# Patient Record
Sex: Male | Born: 1955
Health system: Southern US, Community
[De-identification: ages and names within clinical notes are randomized; demographics above are authoritative.]

## PROBLEM LIST (undated history)

## (undated) DIAGNOSIS — I872 Venous insufficiency (chronic) (peripheral): Secondary | ICD-10-CM

## (undated) DIAGNOSIS — I10 Essential (primary) hypertension: Secondary | ICD-10-CM

## (undated) DIAGNOSIS — M1990 Unspecified osteoarthritis, unspecified site: Secondary | ICD-10-CM

## (undated) DIAGNOSIS — I272 Pulmonary hypertension, unspecified: Secondary | ICD-10-CM

## (undated) DIAGNOSIS — I5021 Acute systolic (congestive) heart failure: Secondary | ICD-10-CM

## (undated) DIAGNOSIS — R0600 Dyspnea, unspecified: Secondary | ICD-10-CM

## (undated) DIAGNOSIS — C8590 Non-Hodgkin lymphoma, unspecified, unspecified site: Secondary | ICD-10-CM

## (undated) DIAGNOSIS — E119 Type 2 diabetes mellitus without complications: Secondary | ICD-10-CM

## (undated) DIAGNOSIS — I509 Heart failure, unspecified: Secondary | ICD-10-CM

## (undated) DIAGNOSIS — I4891 Unspecified atrial fibrillation: Secondary | ICD-10-CM

## (undated) DIAGNOSIS — E785 Hyperlipidemia, unspecified: Secondary | ICD-10-CM

## (undated) DIAGNOSIS — D126 Benign neoplasm of colon, unspecified: Secondary | ICD-10-CM

## (undated) DIAGNOSIS — C801 Malignant (primary) neoplasm, unspecified: Secondary | ICD-10-CM

## (undated) DIAGNOSIS — I83009 Varicose veins of unspecified lower extremity with ulcer of unspecified site: Secondary | ICD-10-CM

## (undated) DIAGNOSIS — M109 Gout, unspecified: Secondary | ICD-10-CM

## (undated) DIAGNOSIS — K648 Other hemorrhoids: Secondary | ICD-10-CM

## (undated) DIAGNOSIS — L97909 Non-pressure chronic ulcer of unspecified part of unspecified lower leg with unspecified severity: Secondary | ICD-10-CM

## (undated) DIAGNOSIS — K5790 Diverticulosis of intestine, part unspecified, without perforation or abscess without bleeding: Secondary | ICD-10-CM

## (undated) HISTORY — PX: ESOPHAGOGASTRODUODENOSCOPY ENDOSCOPY: SHX5814

## (undated) HISTORY — DX: Hyperlipidemia, unspecified: E78.5

## (undated) HISTORY — DX: Diverticulosis of intestine, part unspecified, without perforation or abscess without bleeding: K57.90

## (undated) HISTORY — DX: Non-Hodgkin lymphoma, unspecified, unspecified site: C85.90

## (undated) HISTORY — DX: Venous insufficiency (chronic) (peripheral): I87.2

## (undated) HISTORY — DX: Unspecified atrial fibrillation: I48.91

## (undated) HISTORY — DX: Gout, unspecified: M10.9

## (undated) HISTORY — PX: INGUINAL HERNIA REPAIR: SUR1180

## (undated) HISTORY — DX: Benign neoplasm of colon, unspecified: D12.6

## (undated) HISTORY — PX: BRONCHOSCOPY: SUR163

## (undated) HISTORY — DX: Essential (primary) hypertension: I10

## (undated) HISTORY — DX: Other hemorrhoids: K64.8

## (undated) HISTORY — DX: Pulmonary hypertension, unspecified: I27.20

## (undated) HISTORY — DX: Acute systolic (congestive) heart failure: I50.21

---
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
## 2001-10-01 ENCOUNTER — Emergency Department (HOSPITAL_COMMUNITY): Admission: EM | Admit: 2001-10-01 | Discharge: 2001-10-02 | Payer: Self-pay | Admitting: *Deleted

## 2001-10-02 ENCOUNTER — Encounter: Payer: Self-pay | Admitting: General Surgery

## 2001-10-02 ENCOUNTER — Observation Stay (HOSPITAL_COMMUNITY): Admission: RE | Admit: 2001-10-02 | Discharge: 2001-10-03 | Payer: Self-pay | Admitting: General Surgery

## 2004-08-30 ENCOUNTER — Inpatient Hospital Stay (HOSPITAL_COMMUNITY): Admission: EM | Admit: 2004-08-30 | Discharge: 2004-09-02 | Payer: Self-pay | Admitting: Emergency Medicine

## 2007-02-22 ENCOUNTER — Encounter (HOSPITAL_BASED_OUTPATIENT_CLINIC_OR_DEPARTMENT_OTHER): Admission: RE | Admit: 2007-02-22 | Discharge: 2007-05-23 | Payer: Self-pay | Admitting: Surgery

## 2007-05-23 ENCOUNTER — Encounter (HOSPITAL_BASED_OUTPATIENT_CLINIC_OR_DEPARTMENT_OTHER): Admission: RE | Admit: 2007-05-23 | Discharge: 2007-08-21 | Payer: Self-pay | Admitting: Surgery

## 2007-06-13 ENCOUNTER — Encounter: Admission: RE | Admit: 2007-06-13 | Discharge: 2007-06-13 | Payer: Self-pay | Admitting: Internal Medicine

## 2007-08-27 ENCOUNTER — Encounter (HOSPITAL_BASED_OUTPATIENT_CLINIC_OR_DEPARTMENT_OTHER): Admission: RE | Admit: 2007-08-27 | Discharge: 2007-10-05 | Payer: Self-pay | Admitting: Internal Medicine

## 2007-10-05 ENCOUNTER — Encounter (HOSPITAL_BASED_OUTPATIENT_CLINIC_OR_DEPARTMENT_OTHER): Admission: RE | Admit: 2007-10-05 | Discharge: 2008-01-03 | Payer: Self-pay | Admitting: Internal Medicine

## 2008-01-07 ENCOUNTER — Encounter (HOSPITAL_BASED_OUTPATIENT_CLINIC_OR_DEPARTMENT_OTHER): Admission: RE | Admit: 2008-01-07 | Discharge: 2008-01-07 | Payer: Self-pay | Admitting: Internal Medicine

## 2008-01-08 ENCOUNTER — Encounter (HOSPITAL_BASED_OUTPATIENT_CLINIC_OR_DEPARTMENT_OTHER): Admission: RE | Admit: 2008-01-08 | Discharge: 2008-04-07 | Payer: Self-pay | Admitting: Internal Medicine

## 2008-04-08 ENCOUNTER — Encounter (HOSPITAL_BASED_OUTPATIENT_CLINIC_OR_DEPARTMENT_OTHER): Admission: RE | Admit: 2008-04-08 | Discharge: 2008-07-07 | Payer: Self-pay | Admitting: General Surgery

## 2009-01-10 HISTORY — PX: PORTA CATH INSERTION: CATH118285

## 2009-06-16 DIAGNOSIS — R809 Proteinuria, unspecified: Secondary | ICD-10-CM | POA: Insufficient documentation

## 2009-06-16 DIAGNOSIS — E785 Hyperlipidemia, unspecified: Secondary | ICD-10-CM | POA: Insufficient documentation

## 2009-06-16 DIAGNOSIS — I868 Varicose veins of other specified sites: Secondary | ICD-10-CM | POA: Insufficient documentation

## 2009-06-16 DIAGNOSIS — R7301 Impaired fasting glucose: Secondary | ICD-10-CM | POA: Insufficient documentation

## 2009-06-16 DIAGNOSIS — Z0000 Encounter for general adult medical examination without abnormal findings: Secondary | ICD-10-CM | POA: Insufficient documentation

## 2009-11-16 DIAGNOSIS — I872 Venous insufficiency (chronic) (peripheral): Secondary | ICD-10-CM | POA: Insufficient documentation

## 2009-11-30 ENCOUNTER — Encounter: Admission: RE | Admit: 2009-11-30 | Discharge: 2009-11-30 | Payer: Self-pay | Admitting: Internal Medicine

## 2009-12-23 ENCOUNTER — Ambulatory Visit: Payer: Self-pay | Admitting: Thoracic Surgery

## 2010-01-12 ENCOUNTER — Ambulatory Visit (HOSPITAL_COMMUNITY)
Admission: RE | Admit: 2010-01-12 | Discharge: 2010-01-12 | Payer: Self-pay | Source: Home / Self Care | Attending: Thoracic Surgery | Admitting: Thoracic Surgery

## 2010-01-19 ENCOUNTER — Ambulatory Visit
Admission: RE | Admit: 2010-01-19 | Discharge: 2010-01-19 | Payer: Self-pay | Source: Home / Self Care | Attending: Thoracic Surgery | Admitting: Thoracic Surgery

## 2010-02-01 ENCOUNTER — Ambulatory Visit (HOSPITAL_COMMUNITY)
Admission: RE | Admit: 2010-02-01 | Discharge: 2010-02-01 | Payer: Self-pay | Source: Home / Self Care | Attending: Thoracic Surgery | Admitting: Thoracic Surgery

## 2010-02-02 ENCOUNTER — Ambulatory Visit (HOSPITAL_COMMUNITY)
Admission: RE | Admit: 2010-02-02 | Discharge: 2010-02-02 | Payer: Self-pay | Source: Home / Self Care | Attending: Thoracic Surgery | Admitting: Thoracic Surgery

## 2010-02-02 ENCOUNTER — Ambulatory Visit (HOSPITAL_BASED_OUTPATIENT_CLINIC_OR_DEPARTMENT_OTHER): Payer: 59 | Admitting: Internal Medicine

## 2010-02-02 LAB — CBC
HCT: 41.1 % (ref 39.0–52.0)
Hemoglobin: 14.2 g/dL (ref 13.0–17.0)
MCH: 32.9 pg (ref 26.0–34.0)
MCHC: 34.5 g/dL (ref 30.0–36.0)
MCV: 95.1 fL (ref 78.0–100.0)
Platelets: 218 10*3/uL (ref 150–400)
RBC: 4.32 MIL/uL (ref 4.22–5.81)
RDW: 13.1 % (ref 11.5–15.5)
WBC: 7.1 10*3/uL (ref 4.0–10.5)

## 2010-02-02 LAB — PROTIME-INR
INR: 0.9 (ref 0.00–1.49)
Prothrombin Time: 12.4 seconds (ref 11.6–15.2)

## 2010-02-02 LAB — COMPREHENSIVE METABOLIC PANEL
ALT: 24 U/L (ref 0–53)
AST: 25 U/L (ref 0–37)
Albumin: 3.7 g/dL (ref 3.5–5.2)
Alkaline Phosphatase: 135 U/L — ABNORMAL HIGH (ref 39–117)
BUN: 21 mg/dL (ref 6–23)
CO2: 26 mEq/L (ref 19–32)
Calcium: 9.4 mg/dL (ref 8.4–10.5)
Chloride: 102 mEq/L (ref 96–112)
Creatinine, Ser: 0.88 mg/dL (ref 0.4–1.5)
GFR calc Af Amer: >60 mL/min (ref >60)
GFR calc non Af Amer: >60 mL/min (ref >60)
Glucose, Bld: 118 mg/dL — ABNORMAL HIGH (ref 70–99)
Potassium: 4 mEq/L (ref 3.5–5.1)
Sodium: 137 mEq/L (ref 135–145)
Total Bilirubin: 0.4 mg/dL (ref 0.3–1.2)
Total Protein: 6.6 g/dL (ref 6.0–8.3)

## 2010-02-02 LAB — SURGICAL PCR SCREEN
MRSA, PCR: NEGATIVE
Staphylococcus aureus: NEGATIVE

## 2010-02-02 LAB — TYPE AND SCREEN
ABO/RH(D): O POS
Antibody Screen: NEGATIVE

## 2010-02-02 LAB — APTT: aPTT: 27 seconds (ref 24–37)

## 2010-02-02 LAB — GLUCOSE, CAPILLARY: Glucose-Capillary: 124 mg/dL — ABNORMAL HIGH (ref 70–99)

## 2010-02-04 ENCOUNTER — Ambulatory Visit
Admission: RE | Admit: 2010-02-04 | Discharge: 2010-02-04 | Payer: Self-pay | Source: Home / Self Care | Admitting: Thoracic Surgery

## 2010-02-04 ENCOUNTER — Ambulatory Visit
Admission: RE | Admit: 2010-02-04 | Discharge: 2010-02-04 | Payer: Self-pay | Source: Home / Self Care | Attending: Internal Medicine | Admitting: Internal Medicine

## 2010-02-05 NOTE — Letter (Signed)
February 04, 2010  [name], MD [address] [place], [place] [address]  Re:  [name], [name]                  DOB:  05/31/1955  Dear Dr. Clelia Croft,  I saw the patient back in the office today after his mediastinoscopy and unfortunately he has a non-Hodgkins lymphoma, B-cell type, which is a follicular variant and referred him to Dr. Shirline Frees to undergo chemotherapy.  His PET scan shows this is probably a stage III-IV.  His blood pressure is 132/77, pulse 72, respirations 24, sats were 98%, and his weight is 350 pounds.  I will arrange for him to have Port-A-Cath in the future.  We appreciate the opportunity of seeing the patient.  Sincerely,  Ines Bloomer, M.[name]. Electronically Signed  DPB/MEDQ  [name]:  02/04/2010  T:  02/04/2010  Job:  [id number]

## 2010-02-07 NOTE — Op Note (Signed)
  [name], SALZWEDEL                  ACCOUNT NO.:  [id number]  MEDICAL RECORD NO.:  [id number]          PATIENT TYPE:  OUT  LOCATION:  XRAY                         FACILITY:  [facility]  PHYSICIAN:  Ines Bloomer, M.D. DATE OF BIRTH:  Sep 20, 1955  DATE OF PROCEDURE: DATE OF DISCHARGE:  02/01/2010                              OPERATIVE REPORT   PREOPERATIVE DIAGNOSIS:  Mediastinal adenopathy.  POSTOPERATIVE DIAGNOSIS:  Mediastinal adenopathy.  OPERATION PERFORMED:  Mediastinoscopy.  SURGEON:  Ines Bloomer, MD  ANESTHESIA:  General anesthesia.  After percutaneous insertion of all monitoring lines, the patient underwent general anesthesia.  He did have previous bronchoscopy with endobronchial ultrasounds and the conclusions have been a lymphoproliferative disorder with more tissue was needed, so we decided to do mediastinoscopy.  The anterior neck was prepped and draped in usual sterile manner.  A transverse incision was made, was carried down with electrocautery through subcutaneous tissue and fascia.  The pretracheal fascia was entered.  The video mediastinoscope was inserted and there was a large 4R node.  We did multiple biopsies of this including the frozen section which was read as probable possible follicular lymphoma.  More tissue was sent for lymphoma workup.  Strap muscles were closed with 2-0 Vicryl, subcutaneous tissue with 3-0 Vicryl, and Dermabond for the skin.  The patient returned to recovery room in stable condition.     Ines Bloomer, M.D.     DPB/MEDQ  D:  02/02/2010  T:  02/03/2010  Job:  [id number]  Electronically Signed by Jovita Gamma M.D. on 02/07/2010 05:16:32 PM

## 2010-02-10 ENCOUNTER — Encounter: Payer: Self-pay | Admitting: Cardiovascular Disease

## 2010-02-10 ENCOUNTER — Other Ambulatory Visit (HOSPITAL_COMMUNITY): Payer: Self-pay

## 2010-02-10 ENCOUNTER — Ambulatory Visit (HOSPITAL_COMMUNITY): Payer: 59 | Attending: Internal Medicine

## 2010-02-10 ENCOUNTER — Other Ambulatory Visit: Payer: Self-pay | Admitting: Internal Medicine

## 2010-02-10 DIAGNOSIS — Z5111 Encounter for antineoplastic chemotherapy: Secondary | ICD-10-CM

## 2010-02-10 DIAGNOSIS — C8589 Other specified types of non-Hodgkin lymphoma, extranodal and solid organ sites: Secondary | ICD-10-CM | POA: Insufficient documentation

## 2010-02-10 DIAGNOSIS — I428 Other cardiomyopathies: Secondary | ICD-10-CM | POA: Insufficient documentation

## 2010-02-10 DIAGNOSIS — I739 Peripheral vascular disease, unspecified: Secondary | ICD-10-CM | POA: Insufficient documentation

## 2010-02-10 DIAGNOSIS — I10 Essential (primary) hypertension: Secondary | ICD-10-CM | POA: Insufficient documentation

## 2010-02-10 DIAGNOSIS — C8590 Non-Hodgkin lymphoma, unspecified, unspecified site: Secondary | ICD-10-CM

## 2010-02-10 DIAGNOSIS — Z6841 Body Mass Index (BMI) 40.0 and over, adult: Secondary | ICD-10-CM | POA: Insufficient documentation

## 2010-02-10 DIAGNOSIS — Z87891 Personal history of nicotine dependence: Secondary | ICD-10-CM | POA: Insufficient documentation

## 2010-02-15 ENCOUNTER — Other Ambulatory Visit (HOSPITAL_COMMUNITY): Payer: 59

## 2010-02-15 ENCOUNTER — Encounter: Payer: 59 | Admitting: Internal Medicine

## 2010-02-15 ENCOUNTER — Ambulatory Visit: Payer: 59 | Admitting: Internal Medicine

## 2010-02-15 DIAGNOSIS — C8588 Other specified types of non-Hodgkin lymphoma, lymph nodes of multiple sites: Secondary | ICD-10-CM

## 2010-02-15 LAB — CBC WITH DIFFERENTIAL/PLATELET
Basophils Absolute: 0 10*3/uL (ref 0.0–0.1)
Eosinophils Absolute: 0.4 10*3/uL (ref 0.0–0.5)
HCT: 41.3 % (ref 38.4–49.9)
HGB: 14.3 g/dL (ref 13.0–17.1)
MCV: 94.5 fL (ref 79.3–98.0)
NEUT#: 5.1 10*3/uL (ref 1.5–6.5)
RDW: 13.4 % (ref 11.0–14.6)
lymph#: 1.4 10*3/uL (ref 0.9–3.3)

## 2010-02-15 LAB — COMPREHENSIVE METABOLIC PANEL
ALT: 27 U/L (ref 0–53)
AST: 25 U/L (ref 0–37)
Albumin: 4 g/dL (ref 3.5–5.2)
Alkaline Phosphatase: 135 U/L — ABNORMAL HIGH (ref 39–117)
BUN: 20 mg/dL (ref 6–23)
CO2: 21 mEq/L (ref 19–32)
Calcium: 9.8 mg/dL (ref 8.4–10.5)
Chloride: 102 mEq/L (ref 96–112)
Creatinine, Ser: 0.85 mg/dL (ref 0.40–1.50)
Glucose, Bld: 119 mg/dL — ABNORMAL HIGH (ref 70–99)
Potassium: 4.3 mEq/L (ref 3.5–5.3)
Sodium: 138 mEq/L (ref 135–145)
Total Bilirubin: 0.3 mg/dL (ref 0.3–1.2)
Total Protein: 6.8 g/dL (ref 6.0–8.3)

## 2010-02-15 LAB — URIC ACID: Uric Acid, Serum: 10.8 mg/dL — ABNORMAL HIGH (ref 4.0–7.8)

## 2010-02-15 LAB — LACTATE DEHYDROGENASE: LDH: 156 U/L (ref 94–250)

## 2010-02-18 ENCOUNTER — Ambulatory Visit (HOSPITAL_COMMUNITY)
Admission: RE | Admit: 2010-02-18 | Discharge: 2010-02-18 | Disposition: A | Discharge status: Home / Self Care | Payer: 59 | Source: Ambulatory Visit | Attending: Thoracic Surgery | Admitting: Thoracic Surgery

## 2010-02-18 ENCOUNTER — Other Ambulatory Visit: Payer: Self-pay | Admitting: Internal Medicine

## 2010-02-18 ENCOUNTER — Other Ambulatory Visit: Payer: Self-pay | Admitting: Diagnostic Radiology

## 2010-02-18 ENCOUNTER — Ambulatory Visit (HOSPITAL_COMMUNITY): Payer: 59

## 2010-02-18 ENCOUNTER — Other Ambulatory Visit (HOSPITAL_COMMUNITY): Payer: 59

## 2010-02-18 ENCOUNTER — Ambulatory Visit (HOSPITAL_COMMUNITY): Payer: 59 | Attending: Internal Medicine

## 2010-02-18 DIAGNOSIS — C8589 Other specified types of non-Hodgkin lymphoma, extranodal and solid organ sites: Secondary | ICD-10-CM | POA: Insufficient documentation

## 2010-02-18 DIAGNOSIS — Z01812 Encounter for preprocedural laboratory examination: Secondary | ICD-10-CM | POA: Insufficient documentation

## 2010-02-18 DIAGNOSIS — C8590 Non-Hodgkin lymphoma, unspecified, unspecified site: Secondary | ICD-10-CM

## 2010-02-18 DIAGNOSIS — Z01818 Encounter for other preprocedural examination: Secondary | ICD-10-CM | POA: Insufficient documentation

## 2010-02-18 DIAGNOSIS — C8582 Other specified types of non-Hodgkin lymphoma, intrathoracic lymph nodes: Secondary | ICD-10-CM

## 2010-02-18 DIAGNOSIS — I872 Venous insufficiency (chronic) (peripheral): Secondary | ICD-10-CM | POA: Insufficient documentation

## 2010-02-18 DIAGNOSIS — E785 Hyperlipidemia, unspecified: Secondary | ICD-10-CM | POA: Insufficient documentation

## 2010-02-18 DIAGNOSIS — I10 Essential (primary) hypertension: Secondary | ICD-10-CM | POA: Insufficient documentation

## 2010-02-18 DIAGNOSIS — C8588 Other specified types of non-Hodgkin lymphoma, lymph nodes of multiple sites: Secondary | ICD-10-CM | POA: Insufficient documentation

## 2010-02-18 LAB — CBC
Hemoglobin: 13.4 g/dL (ref 13.0–17.0)
MCH: 32.8 pg (ref 26.0–34.0)
MCV: 94.1 fL (ref 78.0–100.0)
RBC: 4.08 MIL/uL — ABNORMAL LOW (ref 4.22–5.81)

## 2010-02-18 LAB — COMPREHENSIVE METABOLIC PANEL
ALT: 30 U/L (ref 0–53)
AST: 26 U/L (ref 0–37)
CO2: 24 mEq/L (ref 19–32)
Chloride: 103 mEq/L (ref 96–112)
GFR calc Af Amer: >60 mL/min (ref >60)
GFR calc non Af Amer: >60 mL/min (ref >60)
Potassium: 4.4 mEq/L (ref 3.5–5.1)
Sodium: 137 mEq/L (ref 135–145)
Total Bilirubin: 0.4 mg/dL (ref 0.3–1.2)

## 2010-02-18 LAB — SURGICAL PCR SCREEN
MRSA, PCR: NEGATIVE
Staphylococcus aureus: NEGATIVE

## 2010-02-19 ENCOUNTER — Ambulatory Visit: Payer: 59 | Admitting: Thoracic Surgery

## 2010-02-19 ENCOUNTER — Other Ambulatory Visit (HOSPITAL_COMMUNITY): Payer: 59

## 2010-02-22 ENCOUNTER — Encounter (HOSPITAL_BASED_OUTPATIENT_CLINIC_OR_DEPARTMENT_OTHER): Payer: 59 | Admitting: Internal Medicine

## 2010-02-22 DIAGNOSIS — Z5111 Encounter for antineoplastic chemotherapy: Secondary | ICD-10-CM

## 2010-02-22 DIAGNOSIS — Z5112 Encounter for antineoplastic immunotherapy: Secondary | ICD-10-CM

## 2010-02-22 DIAGNOSIS — C8299 Follicular lymphoma, unspecified, extranodal and solid organ sites: Secondary | ICD-10-CM

## 2010-02-23 ENCOUNTER — Encounter (HOSPITAL_BASED_OUTPATIENT_CLINIC_OR_DEPARTMENT_OTHER): Payer: 59 | Admitting: Internal Medicine

## 2010-02-23 DIAGNOSIS — Z5189 Encounter for other specified aftercare: Secondary | ICD-10-CM

## 2010-02-23 DIAGNOSIS — C8588 Other specified types of non-Hodgkin lymphoma, lymph nodes of multiple sites: Secondary | ICD-10-CM

## 2010-02-24 ENCOUNTER — Ambulatory Visit: Payer: 59 | Admitting: Thoracic Surgery

## 2010-02-25 NOTE — Op Note (Signed)
  [name], [name]                  ACCOUNT NO.:  [id number]  MEDICAL RECORD NO.:  [id number]           PATIENT TYPE:  O  LOCATION:  MDC                          FACILITY:  [facility]  PHYSICIAN:  Ines Bloomer, M.D. DATE OF BIRTH:  [birth year]  DATE OF PROCEDURE: DATE OF DISCHARGE:                              OPERATIVE REPORT   PREOPERATIVE DIAGNOSIS:  B-cell lymphoma.  POSTOPERATIVE DIAGNOSIS:  B-cell lymphoma.  OPERATION:  Insertion of left subclavian Port-A-Cath.  SURGEON:  Ines Bloomer, MD  General anesthesia with Cetacaine and Xylocaine with IV sedation.  After prepping and draping the left chest, area was infiltrated with 1% Xylocaine and a left subclavian puncture was performed and a guidewire threaded under fluoro guidance to the right atrium.  Stab wound was made around the guidewire.  Over the guidewire was passed a dilator with a sheath.  We guidewire and the dilator were removed, and through the peel- away sheath was passed the tubing for the Port-A-Cath to the right atrial SVC junction.  Then, another area was infiltrated with 1% Xylocaine.  Inferior to this, a transverse incision was made and dissection was carried down through the subcutaneous tissue to the pectoral fascia.  The pocket was dissected out.  The tubing was then tunneled down to the pocket and cut appropriately and then connected with the connector to the 8.6 Bard Port-A-Cath.  The Port-A-Cath was placed in the pocket and sutured in place with 0 silk.  Wounds were closed with 3-0 Vicryl and Dermabond for the skin, withdrew easily and flushed easily.     Ines Bloomer, M.D.     DPB/MEDQ  D:  02/18/2010  T:  02/19/2010  Job:  [id number]  Electronically Signed by Jovita Gamma M.D. on 02/25/2010 03:39:22 PM

## 2010-02-26 ENCOUNTER — Ambulatory Visit: Payer: 59 | Admitting: Thoracic Surgery

## 2010-03-02 ENCOUNTER — Ambulatory Visit (INDEPENDENT_AMBULATORY_CARE_PROVIDER_SITE_OTHER): Payer: Self-pay | Admitting: Thoracic Surgery

## 2010-03-02 DIAGNOSIS — C8582 Other specified types of non-Hodgkin lymphoma, intrathoracic lymph nodes: Secondary | ICD-10-CM

## 2010-03-03 NOTE — Letter (Deleted)
March 02, 2010    Re:  [name], [name]                  DOB:  06-11-1955    The patient returned today.  His mediastinoscopy site is healing well as well as his Port-A-Cath site.  His blood pressure is 143/75, pulse 66, respirations 18, sats were 95%.  He asked about his bone marrow and we said that was negative also.  We will see him again after he completes his treatment.  He has already had one chemotherapy treatment.  Ines Bloomer, M.[name]. Electronically Signed  DPB/MEDQ  [name]:  03/02/2010  T:  03/02/2010  Job:  [id number]

## 2010-03-15 ENCOUNTER — Encounter (HOSPITAL_BASED_OUTPATIENT_CLINIC_OR_DEPARTMENT_OTHER): Payer: 59 | Admitting: Internal Medicine

## 2010-03-15 ENCOUNTER — Other Ambulatory Visit: Payer: Self-pay | Admitting: Internal Medicine

## 2010-03-15 DIAGNOSIS — Z5112 Encounter for antineoplastic immunotherapy: Secondary | ICD-10-CM

## 2010-03-15 DIAGNOSIS — Z5111 Encounter for antineoplastic chemotherapy: Secondary | ICD-10-CM

## 2010-03-15 DIAGNOSIS — C8299 Follicular lymphoma, unspecified, extranodal and solid organ sites: Secondary | ICD-10-CM

## 2010-03-15 DIAGNOSIS — C8588 Other specified types of non-Hodgkin lymphoma, lymph nodes of multiple sites: Secondary | ICD-10-CM

## 2010-03-15 LAB — LACTATE DEHYDROGENASE: LDH: 160 U/L (ref 94–250)

## 2010-03-15 LAB — CBC WITH DIFFERENTIAL/PLATELET
BASO%: 0.1 % (ref 0.0–2.0)
EOS%: 0.5 % (ref 0.0–7.0)
MCH: 32.1 pg (ref 27.2–33.4)
MCHC: 34.4 g/dL (ref 32.0–36.0)
MONO#: 1 10*3/uL — ABNORMAL HIGH (ref 0.1–0.9)
RDW: 13.6 % (ref 11.0–14.6)
WBC: 9 10*3/uL (ref 4.0–10.3)
lymph#: 1.5 10*3/uL (ref 0.9–3.3)

## 2010-03-15 LAB — COMPREHENSIVE METABOLIC PANEL
ALT: 21 U/L (ref 0–53)
AST: 17 U/L (ref 0–37)
Albumin: 4 g/dL (ref 3.5–5.2)
CO2: 26 mEq/L (ref 19–32)
Calcium: 9.2 mg/dL (ref 8.4–10.5)
Chloride: 102 mEq/L (ref 96–112)
Potassium: 3.7 mEq/L (ref 3.5–5.3)
Sodium: 139 mEq/L (ref 135–145)
Total Protein: 6.2 g/dL (ref 6.0–8.3)

## 2010-03-16 ENCOUNTER — Encounter (HOSPITAL_BASED_OUTPATIENT_CLINIC_OR_DEPARTMENT_OTHER): Payer: 59 | Admitting: Internal Medicine

## 2010-03-16 DIAGNOSIS — C8299 Follicular lymphoma, unspecified, extranodal and solid organ sites: Secondary | ICD-10-CM

## 2010-03-16 DIAGNOSIS — Z5189 Encounter for other specified aftercare: Secondary | ICD-10-CM

## 2010-03-22 ENCOUNTER — Other Ambulatory Visit: Payer: Self-pay | Admitting: Internal Medicine

## 2010-03-22 ENCOUNTER — Encounter (HOSPITAL_BASED_OUTPATIENT_CLINIC_OR_DEPARTMENT_OTHER): Payer: 59 | Admitting: Internal Medicine

## 2010-03-22 DIAGNOSIS — C8588 Other specified types of non-Hodgkin lymphoma, lymph nodes of multiple sites: Secondary | ICD-10-CM

## 2010-03-22 DIAGNOSIS — Z5111 Encounter for antineoplastic chemotherapy: Secondary | ICD-10-CM

## 2010-03-22 DIAGNOSIS — Z5112 Encounter for antineoplastic immunotherapy: Secondary | ICD-10-CM

## 2010-03-22 DIAGNOSIS — C8299 Follicular lymphoma, unspecified, extranodal and solid organ sites: Secondary | ICD-10-CM

## 2010-03-22 LAB — CBC WITH DIFFERENTIAL/PLATELET
EOS%: 0 % (ref 0.0–7.0)
Eosinophils Absolute: 0 10*3/uL (ref 0.0–0.5)
LYMPH%: 4.5 % — ABNORMAL LOW (ref 14.0–49.0)
MCH: 31.7 pg (ref 27.2–33.4)
MCHC: 33.8 g/dL (ref 32.0–36.0)
MCV: 93.7 fL (ref 79.3–98.0)
MONO%: 2.7 % (ref 0.0–14.0)
NEUT#: 11.5 10*3/uL — ABNORMAL HIGH (ref 1.5–6.5)
Platelets: 237 10*3/uL (ref 140–400)
RBC: 3.6 10*6/uL — ABNORMAL LOW (ref 4.20–5.82)

## 2010-03-22 LAB — COMPREHENSIVE METABOLIC PANEL
Albumin: 4 g/dL (ref 3.5–5.2)
BUN: 16 mg/dL (ref 6–23)
BUN: 35 mg/dL — ABNORMAL HIGH (ref 6–23)
CO2: 23 mEq/L (ref 19–32)
Creatinine, Ser: 0.87 mg/dL (ref 0.4–1.5)
Creatinine, Ser: 0.96 mg/dL (ref 0.40–1.50)
GFR calc Af Amer: >60 mL/min (ref >60)
Glucose, Bld: 143 mg/dL — ABNORMAL HIGH (ref 70–99)
Potassium: 4.6 mEq/L (ref 3.5–5.1)
Total Bilirubin: 0.3 mg/dL (ref 0.3–1.2)
Total Protein: 6.8 g/dL (ref 6.0–8.3)

## 2010-03-22 LAB — CULTURE, RESPIRATORY W GRAM STAIN: Culture: NORMAL

## 2010-03-22 LAB — TYPE AND SCREEN: ABO/RH(D): O POS

## 2010-03-22 LAB — CBC
MCV: 96.9 fL (ref 78.0–100.0)
Platelets: 201 10*3/uL (ref 150–400)
RDW: 13.8 % (ref 11.5–15.5)
WBC: 7.8 10*3/uL (ref 4.0–10.5)

## 2010-03-22 LAB — PROTIME-INR: INR: 0.89 (ref 0.00–1.49)

## 2010-03-22 LAB — APTT: aPTT: 27 seconds (ref 24–37)

## 2010-03-22 LAB — AFB CULTURE WITH SMEAR (NOT AT ARMC): Acid Fast Smear: NONE SEEN

## 2010-03-22 LAB — FUNGUS CULTURE W SMEAR: Fungal Smear: NONE SEEN

## 2010-03-22 LAB — SURGICAL PCR SCREEN: MRSA, PCR: NEGATIVE

## 2010-03-29 ENCOUNTER — Other Ambulatory Visit: Payer: Self-pay | Admitting: Internal Medicine

## 2010-03-29 ENCOUNTER — Encounter (HOSPITAL_BASED_OUTPATIENT_CLINIC_OR_DEPARTMENT_OTHER): Payer: 59 | Admitting: Internal Medicine

## 2010-03-29 DIAGNOSIS — Z5111 Encounter for antineoplastic chemotherapy: Secondary | ICD-10-CM

## 2010-03-29 DIAGNOSIS — C8588 Other specified types of non-Hodgkin lymphoma, lymph nodes of multiple sites: Secondary | ICD-10-CM

## 2010-03-29 DIAGNOSIS — C8299 Follicular lymphoma, unspecified, extranodal and solid organ sites: Secondary | ICD-10-CM

## 2010-03-29 DIAGNOSIS — Z5112 Encounter for antineoplastic immunotherapy: Secondary | ICD-10-CM

## 2010-03-29 LAB — COMPREHENSIVE METABOLIC PANEL
CO2: 27 mEq/L (ref 19–32)
Calcium: 9 mg/dL (ref 8.4–10.5)
Glucose, Bld: 95 mg/dL (ref 70–99)
Sodium: 138 mEq/L (ref 135–145)
Total Bilirubin: 0.4 mg/dL (ref 0.3–1.2)
Total Protein: 6.1 g/dL (ref 6.0–8.3)

## 2010-03-29 LAB — CBC WITH DIFFERENTIAL/PLATELET
Eosinophils Absolute: 0 10*3/uL (ref 0.0–0.5)
HCT: 34.7 % — ABNORMAL LOW (ref 38.4–49.9)
LYMPH%: 12.6 % — ABNORMAL LOW (ref 14.0–49.0)
MONO#: 1.1 10*3/uL — ABNORMAL HIGH (ref 0.1–0.9)
NEUT#: 6 10*3/uL (ref 1.5–6.5)
NEUT%: 73 % (ref 39.0–75.0)
Platelets: 148 10*3/uL (ref 140–400)
RBC: 3.7 10*6/uL — ABNORMAL LOW (ref 4.20–5.82)
WBC: 8.3 10*3/uL (ref 4.0–10.3)

## 2010-04-05 ENCOUNTER — Encounter (HOSPITAL_BASED_OUTPATIENT_CLINIC_OR_DEPARTMENT_OTHER): Payer: 59 | Admitting: Internal Medicine

## 2010-04-05 ENCOUNTER — Other Ambulatory Visit: Payer: Self-pay | Admitting: Internal Medicine

## 2010-04-05 DIAGNOSIS — C8588 Other specified types of non-Hodgkin lymphoma, lymph nodes of multiple sites: Secondary | ICD-10-CM

## 2010-04-05 DIAGNOSIS — Z5111 Encounter for antineoplastic chemotherapy: Secondary | ICD-10-CM

## 2010-04-05 DIAGNOSIS — C8590 Non-Hodgkin lymphoma, unspecified, unspecified site: Secondary | ICD-10-CM

## 2010-04-05 DIAGNOSIS — Z5112 Encounter for antineoplastic immunotherapy: Secondary | ICD-10-CM

## 2010-04-05 DIAGNOSIS — C8299 Follicular lymphoma, unspecified, extranodal and solid organ sites: Secondary | ICD-10-CM

## 2010-04-05 LAB — COMPREHENSIVE METABOLIC PANEL
ALT: 19 U/L (ref 0–53)
AST: 14 U/L (ref 0–37)
CO2: 24 mEq/L (ref 19–32)
Calcium: 9.1 mg/dL (ref 8.4–10.5)
Chloride: 105 mEq/L (ref 96–112)
Potassium: 4.1 mEq/L (ref 3.5–5.3)
Sodium: 139 mEq/L (ref 135–145)
Total Protein: 6.5 g/dL (ref 6.0–8.3)

## 2010-04-05 LAB — CBC WITH DIFFERENTIAL/PLATELET
Eosinophils Absolute: 0.1 10*3/uL (ref 0.0–0.5)
LYMPH%: 19.3 % (ref 14.0–49.0)
MONO#: 1.8 10*3/uL — ABNORMAL HIGH (ref 0.1–0.9)
NEUT#: 6 10*3/uL (ref 1.5–6.5)
Platelets: 262 10*3/uL (ref 140–400)
RBC: 3.69 10*6/uL — ABNORMAL LOW (ref 4.20–5.82)
RDW: 14.6 % (ref 11.0–14.6)
WBC: 9.7 10*3/uL (ref 4.0–10.3)
lymph#: 1.9 10*3/uL (ref 0.9–3.3)
nRBC: 0 % (ref 0–0)

## 2010-04-05 LAB — LACTATE DEHYDROGENASE: LDH: 180 U/L (ref 94–250)

## 2010-04-06 ENCOUNTER — Encounter (HOSPITAL_BASED_OUTPATIENT_CLINIC_OR_DEPARTMENT_OTHER): Payer: 59 | Admitting: Internal Medicine

## 2010-04-06 DIAGNOSIS — C8299 Follicular lymphoma, unspecified, extranodal and solid organ sites: Secondary | ICD-10-CM

## 2010-04-06 DIAGNOSIS — Z5189 Encounter for other specified aftercare: Secondary | ICD-10-CM

## 2010-04-12 ENCOUNTER — Other Ambulatory Visit: Payer: Self-pay | Admitting: Internal Medicine

## 2010-04-12 ENCOUNTER — Encounter (HOSPITAL_BASED_OUTPATIENT_CLINIC_OR_DEPARTMENT_OTHER): Payer: 59 | Admitting: Internal Medicine

## 2010-04-12 DIAGNOSIS — C8299 Follicular lymphoma, unspecified, extranodal and solid organ sites: Secondary | ICD-10-CM

## 2010-04-12 DIAGNOSIS — Z5112 Encounter for antineoplastic immunotherapy: Secondary | ICD-10-CM

## 2010-04-12 DIAGNOSIS — Z5111 Encounter for antineoplastic chemotherapy: Secondary | ICD-10-CM

## 2010-04-12 DIAGNOSIS — C8588 Other specified types of non-Hodgkin lymphoma, lymph nodes of multiple sites: Secondary | ICD-10-CM

## 2010-04-12 LAB — CBC WITH DIFFERENTIAL/PLATELET
Basophils Absolute: 0 10*3/uL (ref 0.0–0.1)
EOS%: 0.5 % (ref 0.0–7.0)
LYMPH%: 24.2 % (ref 14.0–49.0)
MCH: 31.9 pg (ref 27.2–33.4)
MCV: 93.9 fL (ref 79.3–98.0)
MONO%: 4.4 % (ref 0.0–14.0)
Platelets: 244 10*3/uL (ref 140–400)
RBC: 3.31 10*6/uL — ABNORMAL LOW (ref 4.20–5.82)
RDW: 15 % — ABNORMAL HIGH (ref 11.0–14.6)

## 2010-04-12 LAB — COMPREHENSIVE METABOLIC PANEL
AST: 13 U/L (ref 0–37)
Albumin: 3.7 g/dL (ref 3.5–5.2)
Alkaline Phosphatase: 120 U/L — ABNORMAL HIGH (ref 39–117)
BUN: 24 mg/dL — ABNORMAL HIGH (ref 6–23)
Potassium: 3.5 mEq/L (ref 3.5–5.3)
Sodium: 139 mEq/L (ref 135–145)
Total Bilirubin: 0.2 mg/dL — ABNORMAL LOW (ref 0.3–1.2)

## 2010-04-12 LAB — URIC ACID: Uric Acid, Serum: 7.2 mg/dL (ref 4.0–7.8)

## 2010-04-19 ENCOUNTER — Ambulatory Visit (HOSPITAL_COMMUNITY)
Admission: RE | Admit: 2010-04-19 | Discharge: 2010-04-19 | Disposition: A | Discharge status: Home / Self Care | Payer: 59 | Source: Ambulatory Visit | Attending: Internal Medicine | Admitting: Internal Medicine

## 2010-04-19 ENCOUNTER — Other Ambulatory Visit: Payer: Self-pay | Admitting: Internal Medicine

## 2010-04-19 ENCOUNTER — Encounter (HOSPITAL_BASED_OUTPATIENT_CLINIC_OR_DEPARTMENT_OTHER): Payer: 59 | Admitting: Internal Medicine

## 2010-04-19 DIAGNOSIS — C8590 Non-Hodgkin lymphoma, unspecified, unspecified site: Secondary | ICD-10-CM

## 2010-04-19 DIAGNOSIS — C8299 Follicular lymphoma, unspecified, extranodal and solid organ sites: Secondary | ICD-10-CM

## 2010-04-19 DIAGNOSIS — Z5111 Encounter for antineoplastic chemotherapy: Secondary | ICD-10-CM

## 2010-04-19 DIAGNOSIS — C8588 Other specified types of non-Hodgkin lymphoma, lymph nodes of multiple sites: Secondary | ICD-10-CM | POA: Insufficient documentation

## 2010-04-19 DIAGNOSIS — M47812 Spondylosis without myelopathy or radiculopathy, cervical region: Secondary | ICD-10-CM | POA: Insufficient documentation

## 2010-04-19 LAB — COMPREHENSIVE METABOLIC PANEL
ALT: 24 U/L (ref 0–53)
AST: 19 U/L (ref 0–37)
Albumin: 3.9 g/dL (ref 3.5–5.2)
Calcium: 9.5 mg/dL (ref 8.4–10.5)
Chloride: 101 mEq/L (ref 96–112)
Creatinine, Ser: 0.99 mg/dL (ref 0.40–1.50)
Potassium: 3.8 mEq/L (ref 3.5–5.3)

## 2010-04-19 LAB — CBC WITH DIFFERENTIAL/PLATELET
BASO%: 1.7 % (ref 0.0–2.0)
Basophils Absolute: 0.1 10*3/uL (ref 0.0–0.1)
Eosinophils Absolute: 0 10*3/uL (ref 0.0–0.5)
HCT: 33.3 % — ABNORMAL LOW (ref 38.4–49.9)
HGB: 11.4 g/dL — ABNORMAL LOW (ref 13.0–17.1)
LYMPH%: 16.5 % (ref 14.0–49.0)
MCHC: 34.3 g/dL (ref 32.0–36.0)
MONO#: 1 10*3/uL — ABNORMAL HIGH (ref 0.1–0.9)
NEUT%: 70.4 % (ref 39.0–75.0)
Platelets: 159 10*3/uL (ref 140–400)
WBC: 8.6 10*3/uL (ref 4.0–10.3)
lymph#: 1.4 10*3/uL (ref 0.9–3.3)

## 2010-04-19 MED ORDER — IOHEXOL 300 MG/ML  SOLN
100.0000 mL | Freq: Once | INTRAMUSCULAR | Status: AC | PRN
  Administered 2010-04-19: 100 mL via INTRAVENOUS

## 2010-04-26 ENCOUNTER — Other Ambulatory Visit: Payer: Self-pay | Admitting: Internal Medicine

## 2010-04-26 ENCOUNTER — Encounter (HOSPITAL_BASED_OUTPATIENT_CLINIC_OR_DEPARTMENT_OTHER): Payer: 59 | Admitting: Internal Medicine

## 2010-04-26 DIAGNOSIS — Z5111 Encounter for antineoplastic chemotherapy: Secondary | ICD-10-CM

## 2010-04-26 DIAGNOSIS — C8299 Follicular lymphoma, unspecified, extranodal and solid organ sites: Secondary | ICD-10-CM

## 2010-04-26 DIAGNOSIS — Z5112 Encounter for antineoplastic immunotherapy: Secondary | ICD-10-CM

## 2010-04-26 LAB — COMPREHENSIVE METABOLIC PANEL
ALT: 21 U/L (ref 0–53)
AST: 17 U/L (ref 0–37)
Alkaline Phosphatase: 95 U/L (ref 39–117)
Chloride: 105 mEq/L (ref 96–112)
Creatinine, Ser: 0.8 mg/dL (ref 0.40–1.50)
Total Bilirubin: 0.3 mg/dL (ref 0.3–1.2)

## 2010-04-26 LAB — CBC WITH DIFFERENTIAL/PLATELET
BASO%: 1.1 % (ref 0.0–2.0)
EOS%: 0.3 % (ref 0.0–7.0)
HCT: 32.6 % — ABNORMAL LOW (ref 38.4–49.9)
LYMPH%: 18.2 % (ref 14.0–49.0)
MCH: 31.9 pg (ref 27.2–33.4)
MCHC: 34.4 g/dL (ref 32.0–36.0)
MCV: 92.6 fL (ref 79.3–98.0)
MONO%: 10.6 % (ref 0.0–14.0)
NEUT%: 69.8 % (ref 39.0–75.0)
lymph#: 1.5 10*3/uL (ref 0.9–3.3)

## 2010-04-27 ENCOUNTER — Encounter (HOSPITAL_BASED_OUTPATIENT_CLINIC_OR_DEPARTMENT_OTHER): Payer: 59 | Admitting: Internal Medicine

## 2010-04-27 DIAGNOSIS — C8299 Follicular lymphoma, unspecified, extranodal and solid organ sites: Secondary | ICD-10-CM

## 2010-04-27 DIAGNOSIS — Z5189 Encounter for other specified aftercare: Secondary | ICD-10-CM

## 2010-05-03 ENCOUNTER — Encounter (HOSPITAL_BASED_OUTPATIENT_CLINIC_OR_DEPARTMENT_OTHER): Payer: 59 | Admitting: Internal Medicine

## 2010-05-03 ENCOUNTER — Other Ambulatory Visit: Payer: Self-pay | Admitting: Internal Medicine

## 2010-05-03 DIAGNOSIS — C8299 Follicular lymphoma, unspecified, extranodal and solid organ sites: Secondary | ICD-10-CM

## 2010-05-03 LAB — COMPREHENSIVE METABOLIC PANEL
ALT: 22 U/L (ref 0–53)
AST: 19 U/L (ref 0–37)
BUN: 20 mg/dL (ref 6–23)
Creatinine, Ser: 0.83 mg/dL (ref 0.40–1.50)
Total Bilirubin: 0.5 mg/dL (ref 0.3–1.2)

## 2010-05-03 LAB — CBC WITH DIFFERENTIAL/PLATELET
BASO%: 1.2 % (ref 0.0–2.0)
Basophils Absolute: 0.1 10*3/uL (ref 0.0–0.1)
EOS%: 1.4 % (ref 0.0–7.0)
HCT: 31.7 % — ABNORMAL LOW (ref 38.4–49.9)
HGB: 10.8 g/dL — ABNORMAL LOW (ref 13.0–17.1)
LYMPH%: 22.8 % (ref 14.0–49.0)
MCH: 31.6 pg (ref 27.2–33.4)
MCHC: 34 g/dL (ref 32.0–36.0)
MCV: 93 fL (ref 79.3–98.0)
NEUT%: 63 % (ref 39.0–75.0)
Platelets: 192 10*3/uL (ref 140–400)

## 2010-05-10 ENCOUNTER — Encounter (HOSPITAL_BASED_OUTPATIENT_CLINIC_OR_DEPARTMENT_OTHER): Payer: 59 | Admitting: Internal Medicine

## 2010-05-10 ENCOUNTER — Other Ambulatory Visit: Payer: Self-pay | Admitting: Internal Medicine

## 2010-05-10 DIAGNOSIS — C8299 Follicular lymphoma, unspecified, extranodal and solid organ sites: Secondary | ICD-10-CM

## 2010-05-10 DIAGNOSIS — Z5111 Encounter for antineoplastic chemotherapy: Secondary | ICD-10-CM

## 2010-05-10 LAB — COMPREHENSIVE METABOLIC PANEL
ALT: 23 U/L (ref 0–53)
BUN: 20 mg/dL (ref 6–23)
CO2: 21 mEq/L (ref 19–32)
Calcium: 9.4 mg/dL (ref 8.4–10.5)
Chloride: 102 mEq/L (ref 96–112)
Creatinine, Ser: 1.08 mg/dL (ref 0.40–1.50)
Glucose, Bld: 92 mg/dL (ref 70–99)
Total Bilirubin: 0.3 mg/dL (ref 0.3–1.2)

## 2010-05-10 LAB — CBC WITH DIFFERENTIAL/PLATELET
BASO%: 1.5 % (ref 0.0–2.0)
Basophils Absolute: 0.1 10*3/uL (ref 0.0–0.1)
HCT: 33.1 % — ABNORMAL LOW (ref 38.4–49.9)
HGB: 11.3 g/dL — ABNORMAL LOW (ref 13.0–17.1)
LYMPH%: 13.7 % — ABNORMAL LOW (ref 14.0–49.0)
MCH: 31.4 pg (ref 27.2–33.4)
MCHC: 34.2 g/dL (ref 32.0–36.0)
MONO#: 1.3 10*3/uL — ABNORMAL HIGH (ref 0.1–0.9)
NEUT%: 69 % (ref 39.0–75.0)
Platelets: 191 10*3/uL (ref 140–400)
WBC: 8.1 10*3/uL (ref 4.0–10.3)
lymph#: 1.1 10*3/uL (ref 0.9–3.3)

## 2010-05-14 NOTE — Assessment & Plan Note (Signed)
OFFICE VISIT  [name], [name] DOB:  07/26/1955                                        March 02, 2010 CHART #:  [id number]  The patient returned today.  His mediastinoscopy site is healing well as well as his Port-A-Cath site.  His blood pressure is 143/75, pulse 66, respirations 18, sats were 95%.  He asked about his bone marrow and we said that was negative also.  We will see him again after he completes his treatment.  He has already had one chemotherapy treatment.  Ines Bloomer, M.D. Electronically Signed  DPB/MEDQ  D:  03/02/2010  T:  03/02/2010  Job:  [id number]

## 2010-05-17 ENCOUNTER — Other Ambulatory Visit: Payer: Self-pay | Admitting: Internal Medicine

## 2010-05-17 ENCOUNTER — Encounter (HOSPITAL_BASED_OUTPATIENT_CLINIC_OR_DEPARTMENT_OTHER): Payer: 59 | Admitting: Internal Medicine

## 2010-05-17 DIAGNOSIS — Z5112 Encounter for antineoplastic immunotherapy: Secondary | ICD-10-CM

## 2010-05-17 DIAGNOSIS — C8299 Follicular lymphoma, unspecified, extranodal and solid organ sites: Secondary | ICD-10-CM

## 2010-05-17 DIAGNOSIS — Z5111 Encounter for antineoplastic chemotherapy: Secondary | ICD-10-CM

## 2010-05-17 LAB — CBC WITH DIFFERENTIAL/PLATELET
BASO%: 1.3 % (ref 0.0–2.0)
Basophils Absolute: 0.1 10*3/uL (ref 0.0–0.1)
Eosinophils Absolute: 0.1 10*3/uL (ref 0.0–0.5)
HCT: 34.5 % — ABNORMAL LOW (ref 38.4–49.9)
HGB: 11.5 g/dL — ABNORMAL LOW (ref 13.0–17.1)
LYMPH%: 14.7 % (ref 14.0–49.0)
MCHC: 33.3 g/dL (ref 32.0–36.0)
MONO#: 1.1 10*3/uL — ABNORMAL HIGH (ref 0.1–0.9)
NEUT#: 5.2 10*3/uL (ref 1.5–6.5)
NEUT%: 68.3 % (ref 39.0–75.0)
Platelets: 241 10*3/uL (ref 140–400)
WBC: 7.6 10*3/uL (ref 4.0–10.3)
lymph#: 1.1 10*3/uL (ref 0.9–3.3)

## 2010-05-17 LAB — COMPREHENSIVE METABOLIC PANEL
AST: 23 U/L (ref 0–37)
Albumin: 4.3 g/dL (ref 3.5–5.2)
BUN: 27 mg/dL — ABNORMAL HIGH (ref 6–23)
CO2: 24 mEq/L (ref 19–32)
Calcium: 9.8 mg/dL (ref 8.4–10.5)
Chloride: 102 mEq/L (ref 96–112)
Creatinine, Ser: 0.95 mg/dL (ref 0.40–1.50)
Potassium: 4.5 mEq/L (ref 3.5–5.3)

## 2010-05-17 LAB — LACTATE DEHYDROGENASE: LDH: 213 U/L (ref 94–250)

## 2010-05-18 ENCOUNTER — Encounter (HOSPITAL_BASED_OUTPATIENT_CLINIC_OR_DEPARTMENT_OTHER): Payer: 59 | Admitting: Internal Medicine

## 2010-05-18 DIAGNOSIS — C8299 Follicular lymphoma, unspecified, extranodal and solid organ sites: Secondary | ICD-10-CM

## 2010-05-18 DIAGNOSIS — Z5189 Encounter for other specified aftercare: Secondary | ICD-10-CM

## 2010-05-24 ENCOUNTER — Other Ambulatory Visit: Payer: Self-pay | Admitting: Internal Medicine

## 2010-05-24 ENCOUNTER — Encounter (HOSPITAL_BASED_OUTPATIENT_CLINIC_OR_DEPARTMENT_OTHER): Payer: 59 | Admitting: Internal Medicine

## 2010-05-24 DIAGNOSIS — C8299 Follicular lymphoma, unspecified, extranodal and solid organ sites: Secondary | ICD-10-CM

## 2010-05-24 DIAGNOSIS — Z5189 Encounter for other specified aftercare: Secondary | ICD-10-CM

## 2010-05-24 DIAGNOSIS — Z5111 Encounter for antineoplastic chemotherapy: Secondary | ICD-10-CM

## 2010-05-24 LAB — CBC WITH DIFFERENTIAL/PLATELET
EOS%: 1.4 % (ref 0.0–7.0)
Eosinophils Absolute: 0.1 10*3/uL (ref 0.0–0.5)
MCH: 32.2 pg (ref 27.2–33.4)
MCV: 93.2 fL (ref 79.3–98.0)
MONO%: 10.5 % (ref 0.0–14.0)
NEUT#: 3.2 10*3/uL (ref 1.5–6.5)
RBC: 3.03 10*6/uL — ABNORMAL LOW (ref 4.20–5.82)
RDW: 18 % — ABNORMAL HIGH (ref 11.0–14.6)
lymph#: 0.9 10*3/uL (ref 0.9–3.3)

## 2010-05-24 LAB — COMPREHENSIVE METABOLIC PANEL
AST: 15 U/L (ref 0–37)
Albumin: 3.8 g/dL (ref 3.5–5.2)
Alkaline Phosphatase: 127 U/L — ABNORMAL HIGH (ref 39–117)
Chloride: 103 mEq/L (ref 96–112)
Potassium: 3.8 mEq/L (ref 3.5–5.3)
Sodium: 140 mEq/L (ref 135–145)
Total Protein: 5.4 g/dL — ABNORMAL LOW (ref 6.0–8.3)

## 2010-05-25 NOTE — Assessment & Plan Note (Signed)
[facility]   NAMEWELBY, MONTMINY                  ACCOUNT NO.:  [id number]   MEDICAL RECORD NO.:  [id number]      DATE OF BIRTH:  19-Oct-1955   PHYSICIAN:  Leonie Man, M.D.    VISIT DATE:  06/02/2008                                   OFFICE VISIT   PROBLEM:  Recurrent and persistent right medial malleolar ulcer.  The  ulcer continues to heal slowly.  The wound size has decreased now to 0.4  x 0.2 x 0.1, clean granulating base.  His last treatment was with  Promogran and a 30-40 mm stocking over this wound.   PHYSICAL EXAMINATION:  The patient's temperature is 98.4, pulse 82,  respirations 18, blood pressure 168/112 today.  As noted, the wound is  clean and granulating, it is now 100% granulated with advancing  epithelialization.   TREATMENT PLAN:  Promogran with an Allevyn pad and hydrogel with  compression stockings.  We will follow up with him again in 2 weeks.      Leonie Man, M.D.  Electronically Signed     PB/MEDQ  D:  06/02/2008  T:  06/02/2008  Job:  [id number]

## 2010-05-25 NOTE — Assessment & Plan Note (Signed)
[facility]   [name], [name]                  ACCOUNT NO.:  [id number]   MEDICAL RECORD NO.:  [id number]      DATE OF BIRTH:  12-30-1955   PHYSICIAN:  Lenon Curt. Chilton Si, M.D.        VISIT DATE:                                   OFFICE VISIT   HISTORY:  A 55 year old male who returns today for a recheck of a wound  of the right medial lower leg.  He had application of Apligraf on  November 12, 2007.  He has made good progress with wound closure and  current measurements are down to 3.2 cm length x 1.1 x 0.2 cm depth.  There is no evidence of infection.  He continues to feel better in  regards to pain relief.  He has no particular complaints today.   PHYSICAL EXAMINATION:  Temp 97.8, pulse 82, respirations 16, and blood  pressure 177/96.  Wound measurements is noted above.  Basal wound is  clean.  The Apligraf appears to be holding well.   TREATMENT:  The wound was covered with Mepitel, Steri-Stripped, and  Ultra Mide applied to the skin around it and peripheral light dressing  was reapplied.  He is to return in 1 week for reevaluation.   CPT code 04540.   ICD-9 code number 454.0 varicose veins with stasis ulcer.      Lenon Curt Chilton Si, M.D.  Electronically Signed     AGG/MEDQ  D:  11/26/2007  T:  11/26/2007  Job:  [id number]

## 2010-05-25 NOTE — Assessment & Plan Note (Signed)
[facility]   NAMEMANVIR, [name]                  ACCOUNT NO.:  [id number]   MEDICAL RECORD NO.:  [id number]      DATE OF BIRTH:  10-19-1955   PHYSICIAN:  Theresia Majors. Tanda Rockers, M.D. VISIT DATE:  04/09/2007                                   OFFICE VISIT   SUBJECTIVE:  [name] is a 55 year old man who we are following for  stasis ulcer involving the right lower extremity.  In the interim, we  have treated him with an Radio broadcast assistant.  He continues to be ambulatory.  He  does admit to some increased swelling with prolonged standing.  He does  utilize periodic elevation.  There has been no fever.  There has been no  excessive drainage.   OBJECTIVE:  VITAL SIGNS:  Blood pressure is 182/94, respirations 20,  pulse rate 83, temperature 97.  EXTREMITIES:  Inspection of the lower extremity shows that wound #1 on  the right medial lower extremity continues to contract with minimum  slough and advancing epithelium.  The erythema has decreased.  The edema  is well-controlled.  Similarly, wound #2 on the lateral aspect of the  right foot continues to contract with no evidence of infection.  The  pedal pulse remains palpable.  There is no evidence of abscess or  cellulitis.   ASSESSMENT:  Clinical improvement of stasis ulcer.   PLAN:  We will return the patient to an Radio broadcast assistant.  He will be seen by  the nurse weekly and by the physician at 2-week intervals.      Harold A. Tanda Rockers, M.D.  Electronically Signed     HAN/MEDQ  D:  04/09/2007  T:  04/09/2007  Job:  [id number]

## 2010-05-25 NOTE — Assessment & Plan Note (Signed)
[facility]   [name], [name]                  ACCOUNT NO.:  [id number]   MEDICAL RECORD NO.:  [id number]      DATE OF BIRTH:  [birth year]   PHYSICIAN:  Jonelle Sports. Sevier, M.D.  VISIT DATE:  09/19/2007                                   OFFICE VISIT   HISTORY:  This 55 year old white male with a longstanding and recurrent  stasis ulcerations of the right lower extremity, particularly in the  right supramalleolar area, has been followed for chronic ulcer in that  area, now for several months here at the clinic.  Two weeks ago, he  underwent Apligraf application by Dr. Leanord Hawking having had an earlier  Apligraf in July, which led to some temporary improvement, but this was  not sustained.   The patient reports that since his application 2 weeks ago there have  been no problems nor to the nurse's note any at the time of his  rewrapping last week, at which time the wound was undressed down to the  left patella level.   There has been no pain, swelling, odor, or other problems that he is  aware of.  He has noted some irritation from the wrap on the lateral  right calf.   On examination today, blood pressure 192/99, pulse 84, respirations 20,  temperature 97.6.  The wound itself is now completely undressed and has  a somewhat soupy basis would be expected with recent placement of  Apligraf, but there is evidence of epithelialization preceding from the  wound margins and the wound now at 4.2 x 1.7 x 0.2 cm indeed measured  slightly less than it had before.  There is no significant odor, no  periwound inflammation, or any other signs of concern.   The rubbed area from his wrap on the high lateral right calf measures  0.9 x 0.8 x 0.2 cm.   IMPRESSION:  Apparent satisfactory course following Apligraf application  to chronic venous stasis ulceration.   DISPOSITION:  1. The wound itself would be treated with an application of hydrogel      again covered with a nonstick  dressing such as Mepitel with a soft      sore pad placed over that simply for protection and that extremity      then return to a Profore wrap.  2. The area of dressing irritation will be covered with a piece of      nonstick gauze covered again with a soft sore pad to hopefully      allow this to heal.  3. Followup visit will be here in 1 week or sooner p.r.n.           ______________________________  Jonelle Sports. Cheryll Cockayne, M.D.     RES/MEDQ  D:  09/19/2007  T:  09/19/2007  Job:  [id number]

## 2010-05-25 NOTE — Assessment & Plan Note (Signed)
[facility]   [name], [name]                  ACCOUNT NO.:  [id number]   MEDICAL RECORD NO.:  [id number]      DATE OF BIRTH:  [birth year]   PHYSICIAN:  Theresia Majors. Tanda Rockers, M.D. VISIT DATE:  06/18/2007                                   OFFICE VISIT   SUBJECTIVE:  [name] is a 55 year old man we are following for stasis  ulcer involving the right medial ankle.  In the interim, there has been  reasonable control of his edema.  There has been no excessive pain or  malodor.  There has been no fever.   OBJECTIVE:  VITAL SIGNS:  Blood pressure is 179/86, respiration 16,  pulse rate 90, and temperature is 97.8.  EXTREMITIES:  Inspection of the right lower extremity shows that the  persistence of chronic stasis changes including 2+ edema.  There is  marked amount of disclamation with waxy exudate.  The wound underwent a  selective debridement utilizing a curet with removal of nonviable tissue  in some areas of slush.  Minimum hemorrhage was stimulated and was  easily controlled with direct pressure.  There is no evidence of active  infection.  Pedal pulses remain palpable.  Capillary refill is brisk.   ASSESSMENT:  Clinical improvement of stasis ulcer.   PLAN:  The patient will be returned to an Radio broadcast assistant.  He will be  evaluated weekly by the nurse and 2 weeks by the physician.      Harold A. Tanda Rockers, M.D.  Electronically Signed     HAN/MEDQ  D:  06/18/2007  T:  06/19/2007  Job:  [id number]

## 2010-05-25 NOTE — Discharge Summary (Signed)
NAMEBINYAMIN, [name]                  ACCOUNT NO.:  [id number]   MEDICAL RECORD NO.:  [id number]          PATIENT TYPE:  REC   LOCATION:  FOOT                         FACILITY:  [facility]   PHYSICIAN:  Barry Dienes. Eloise Harman, M.D.DATE OF BIRTH:  1955-04-10   DATE OF ADMISSION:  08/27/2007  DATE OF DISCHARGE:                               DISCHARGE SUMMARY   SUBJECTIVE:  The patient is a 55 year old Caucasian man with recurrent  stasis ulcerations of the right lower extremity.  Approximately 3 weeks  ago, he underwent Apligraf application by Dr. Leanord Hawking having had an  earlier Apligraf application in July.  He has not had significant pain  in the right leg and notes much decreased drainage.  He also notes that  he has chronic mild fatigue that has improved with less right leg pain  and presumably better quality sleep.  He had been treated with hydrogel,  Mepitel, SofSorb with a Profore wrap on the right lower extremity and  Ultra-Mide to the entire distal right lower extremity.   OBJECTIVE:  VITAL SIGNS:  Blood pressure 144/78, pulse 95, respirations  20, temperature 97.9.  EXTREMITIES:  The condition of his wounds is as follows:  1. Wound number 1:  Location is the distal right medial leg overlying      the medial malleolus.  The ulcer measures 3.5 cm x 1.5 cm x 0.1 cm.      There is no eschar and minimal necrotic debris on the wound.  The      wound base is red and there is pink granulation tissue.  There is      no exposed bone, tendon or muscle and a scant amount of      serosanguineous discharge.  The periwound area was intact, although      mildly macerated.  2. Wound number 4 is on the proximal aspect of the right medial leg      and consists of a small eschar with very minimal epithelial      ulceration.  There is no significant drainage from the wound and no      signs of infection.   ASSESSMENT:  Interval improvement in the size of the right distal leg  stasis ulcer following second  application of Apligraf.   PLAN:  After the wound and right leg were cleaned and topical anesthesia  was obtained via application of EMLA cream on the distal leg ulcer, a  number 15 scalpel was used to gently debride the necrotic tissue in the  ulcer.  The patient tolerated this well with no significant discomfort.  Following this, A&D ointment was applied around the wound.  Ultra-Mide  was applied to the distal right leg.  The ulcer was covered with Mepitel  and SofSorb, and then the entire right leg was covered with a Profor  wrap.  The wound on the more proximal aspect of the right leg was not  debrided as it was quite superficial.  The patient was advised to  schedule a follow up physician visit next week.  At that time if his  wound dimensions are not continuing to improve, we will consider a third  application of Apligraf.  Of note, he is to see his primary care  physician soon and is considering a workup to exclude the possibility of  underlying obstructive sleep apnea.           ______________________________  Barry Dienes. Eloise Harman, M.D.     DGP/MEDQ  D:  09/25/2007  T:  09/26/2007  Job:  [id number]

## 2010-05-25 NOTE — Assessment & Plan Note (Signed)
[facility]   NAMEWHIT, BRUNI NO.:  [id number]   MEDICAL RECORD NO.:  [id number]      DATE OF BIRTH:  03/11/1955   PHYSICIAN:  Maxwell Caul, M.D. VISIT DATE:  12/13/2007                                   OFFICE VISIT   Mr. Propes is a gentleman that we have been working on for sometime now  for a very recalcitrant wound on his right medial lower leg.  He has had  3 Apligraf placed most recently on November 12, 2007.  He has made good  progress with this towards wound closure, and the wound continues to  regress in size.  There is progressive epithelialization from the  superior aspect, and the wound is bridged in the center by an ever  expanding bridge of epithelialization.  I am forecasting closure within  the next 2-3 weeks.   The patient spent some time talking to me today about referral to a vein  specialist.  I had in my mind that he thought that he had already had  this evaluation.  He has seen a vascular surgeon in [facility] and then  was followed at the [facility] here and has had various ligation  procedures performed there.  My sense of it is he probably has already  had a thorough evaluation he is going to get; however, he would be a  candidate, I guess, to see an Warden/ranger, perhaps at [facility] after  this heals.   On examination, he is afebrile.  The wound continues to improve as noted  above.   IMPRESSIONS:  Venous stasis ulceration in the setting of severe venous  stasis and varicose veins.  We continued with Silverlon and hydrogel  under an Unna wrap.  We will see him again next week.           ______________________________  Maxwell Caul, M.D.     MGR/MEDQ  D:  12/13/2007  T:  12/13/2007  Job:  [id number]

## 2010-05-25 NOTE — Assessment & Plan Note (Signed)
[facility]   NAMEJEOVANY, HUITRON                  ACCOUNT NO.:  [id number]   MEDICAL RECORD NO.:  [id number]      DATE OF BIRTH:  Dec 12, 1955   PHYSICIAN:  Maxwell Caul, M.D. VISIT DATE:  11/12/2007                                   OFFICE VISIT   Mr. Treto is a gentleman with a refractory area of venous stasis on his  right lower extremity.  We have been making slow but steady progress  using silver alginate, Ultra Mide, and a Profore.  The wound has  required gentle debridement.  However, in general, there is no evidence  of infection.   On examination, temperature is 97.8.  The areas on his right medial  lower leg measures 3.2 x 1.3 x 0.2.  I gently debrided this and then  reapplied an Apligraf, fixed with Steri-Strips, and covered with  Mepitel.  We reapplied his Profore dressing.  I am hopeful that this  will cause this very stubborn wound to close over in the next 2-3 weeks.           ______________________________  Maxwell Caul, M.D.     MGR/MEDQ  D:  11/12/2007  T:  11/13/2007  Job:  [id number]

## 2010-05-25 NOTE — Assessment & Plan Note (Signed)
[facility]   NAMEDEMITRIOUS, MCCANNON                  ACCOUNT NO.:  [id number]   MEDICAL RECORD NO.:  [id number]      DATE OF BIRTH:  18-Aug-1955   PHYSICIAN:  Maxwell Caul, M.D. VISIT DATE:  10/22/2007                                   OFFICE VISIT   Mr. Gunkel returns today for review of refractory venous stasis ulceration  of right lower extremity medially.  He really has no additional  symptoms.  No pain or drainage is noted.  A complete review of his  recent wound treatment is in my note from October 04, 2007.   On examination, he is afebrile with a temperature of 97.6.  The wound  measures 3.6 x 1.5 x 0.2.  I did a non-excisional debridement of the  adherent eschar in the wound base after which the granulation looked  healthy.   I have continued to Puracol Ag, hydrogel with a dry dressing, a Profore,  and Ultra-Mide.  I will consider a third Apligraf when I see him next  week.           ______________________________  Maxwell Caul, M.D.     MGR/MEDQ  D:  10/22/2007  T:  10/23/2007  Job:  [id number]

## 2010-05-25 NOTE — Assessment & Plan Note (Signed)
[facility]   NAMEJAQUES, MINEER NO.:  [id number]   MEDICAL RECORD NO.:  [id number]      DATE OF BIRTH:  1955-09-11   PHYSICIAN:  Maxwell Caul, M.D.      VISIT DATE:                                   OFFICE VISIT   HISTORY:  Mr. Brookens is a gentleman we have been following for a large  area of refractory venous stasis on the right medial malleolar area.  He  had an Apligraf on July 16, 2007.  This made a fairly dramatic  improvement, but he is still left with some ulceration and it was felt  that repeat Apligraf would be beneficial.  Most recently he has been  receiving Silverlon and Hydrogel under a Profore.   On examination, the wound measured 4.4 x 2 x 0.2.  The area underwent a  nonselective debridement of a mild amount of adherent eschar.  This was  done with EMLA for anesthesia.  He tolerated this well.  After which, we  applied an Apligraf in the standard fashion.   IMPRESSION:  Refractory venous stasis ulceration as described above.  We  applied an Apligraf.  This was fixed in position with Steri-Strips by  the physician, after which Mepitel was applied and he underwent  Ultra-  Mide and another Profore Wrap.  We will see this again next week.           ______________________________  Maxwell Caul, M.D.     MGR/MEDQ  D:  09/03/2007  T:  09/04/2007  Job:  [id number]

## 2010-05-25 NOTE — Assessment & Plan Note (Signed)
[facility]   [name], [name]                  ACCOUNT NO.:  [id number]   MEDICAL RECORD NO.:  [id number]      DATE OF BIRTH:  [birth year]   PHYSICIAN:  Leonie Man, M.D.         VISIT DATE:                                   OFFICE VISIT   PROBLEM:  Recurrent and persistent right medial malleolar ulceration  which had previously healed.  The wound was seen and debrided  approximately 1 week ago and at that time measured 0.3 x 0.5 x 0.2.  On  today's evaluation and measurement, the wound is 0.7 x 0.7 x 0.1.  Although this shows some degree of enlargement, the patient feels that  this is getting better and he has been doing dressing changes on these  himself.  The base of the wound is clean and shows some good  granulations.  We have been treating this with Promogran and hydrogel  and will continue to do this and follow up with him in 2 weeks.      Leonie Man, M.D.  Electronically Signed     PB/MEDQ  D:  05/05/2008  T:  05/05/2008  Job:  [id number]

## 2010-05-25 NOTE — Assessment & Plan Note (Signed)
[facility]   [name], BROKER NO.:  [id number]   MEDICAL RECORD NO.:  [id number]      DATE OF BIRTH:  [birth year]   PHYSICIAN:  Maxwell Caul, M.D. VISIT DATE:  08/27/2007                                   OFFICE VISIT   LOCATION:  [facility].   [name] is a gentleman we have been following for refractory venostasis  ulceration on the right medial malleolar area.  He had an Apligraf  placed on July 16, 2007.  This appears to have done some improvement.  He  has some drainage, but no pain.  He continues to be very active in terms  of his lifestyle.  Most recently, he had silver alginate and a Profore  applied.   On examination, he is afebrile.  He continues to have venostasis with a  large amount of venostasis inflammation.  The wound required a non-  selective debridement using a #10 blade and EMLA for anesthesia.  Hemostasis was unnecessary.  The base of the wound is granulated and  healthy.  Edema control is marginal.   IMPRESSION:  Venostasis ulceration.  We applied Silverlon and reapplied  the Profore.  Because of some concerns about his arterial supply, we did  ABIs.  He has a left ABI of 1 and a right of 1.33.  The waves on the  left are triphasic.  The waves on the right, pedal pulses were  monophasic, however, he has a booming right dorsalis pedis pulse.  Therefore, I am left with the impression this was probably a technical  problem.   We will see him again in 1 week.  We have applied Silverlon and a  Profore with Ultra Mide to the extensive venostasis.           ______________________________  Maxwell Caul, M.D.     MGR/MEDQ  D:  08/27/2007  T:  08/28/2007  Job:  [id number]

## 2010-05-25 NOTE — Assessment & Plan Note (Signed)
[facility]   NAMEDUBLIN, GRAYER                  ACCOUNT NO.:  [id number]   MEDICAL RECORD NO.:  [id number]      DATE OF BIRTH:  23-Dec-1955   PHYSICIAN:  Leonie Man, M.D.    VISIT DATE:  07/07/2008                                   OFFICE VISIT   PROBLEM:  Recurrent venous stasis ulcer disease of the right medial  malleolus in this 55 year old man, whose most recent therapy has been  with Promogran, hydrogel, and a compression stocking.  On today's current evaluation, there are no wound measurements and that  the wound appears to be completely closed and fully epithelialized at  this point.  There is very minimal to no swelling at the ankle of his  right leg.   The patient is afebrile with stable vital signs.  He is not complaining  of any pain or discomfort.  As noted, the wound now appears to be fully  epithelialized with minimal residual edema.  I will go ahead and  discharge him with the following instructions: continue hydrogel over  the recent wound area and Allevyn pad to maintain moisturization,  continue wearing venous stasis stockings at 30-40 mmHg pressure.  Followup is p.r.n.      Leonie Man, M.D.  Electronically Signed     PB/MEDQ  D:  07/07/2008  T:  07/07/2008  Job:  [id number]

## 2010-05-25 NOTE — Letter (Signed)
December 23, 2009   [name], M.[name].  [address]  [place], [place] [address]   Re:  [name], [name]                  DOB:  [birth year]   Dear Gala Romney,   I saw the patient in the office today.  This 55 year old Caucasian male  was found to have a right paratracheal mass and subcarinal adenopathy on  the chest x-ray and followup CT scan.  He quit smoking 5 years ago.  He  has had no fever, chills, or excessive sputum.  No weight loss.  He  actually has BMI over 30.  He is referred here for biopsy.   PAST MEDICAL HISTORY:  He has no allergy.  He is on Norvasc, Benicar,  Advil, aspirin, Xenical, omega 3, vitamin [name], and Bystolic for  hypertension, hyperlipidemia, and as mentioned morbid obesity.   FAMILY HISTORY:  His father has just had renal cell cancer.   SOCIAL HISTORY:  He is a Associate Professor.  He quit smoking 5 years ago.  Has an occasional drink.   REVIEW OF SYSTEMS:  He is 350 pounds, 6 feet 1 inch.  GENERAL:  His weight has been stable.  CARDIAC:  No angina or atrial fibrillation.  PULMONARY:  See history of present illness.  GI:  No nausea, vomiting, constipation, or diarrhea.  GU:  No kidney disease, dysuria, or frequent urination.  VASCULAR:  No claudication, DVT, or TIAs.  NEUROLOGICAL:  No dizziness, headaches, blackouts, or seizures.  MUSCULOSKELETAL:  No arthritis.  PSYCHIATRIC:  No depression or nervousness.  EYES/ENT:  No change in his eyesight or hearing.  HEMATOLOGICAL:  No problems with bleeding or clotting disorders.   PHYSICAL EXAMINATION:  He is an obese Caucasian male, in no acute  distress.  His blood pressure is 131/70, pulse 74, respirations were 18,  saturations were 95%.  Head, Eyes, Ears, Nose, and Throat:  Unremarkable.  Neck:  Supple without thyromegaly.  Chest:  Clear to  auscultation and percussion.  Heart:  Regular sinus rhythm.  No murmurs.  Abdomen:  Soft and obese.  Bowel sounds are normal.  Extremities:  Pulses are 2+.  No clubbing or  edema.  Neurologic:  He is oriented x3.  Sensory and motor intact.   I have discussed situation with him.  I would recommend that he get a  bronchoscopy with endobronchial ultrasound and possibly mediastinoscopy.  We have tentatively scheduled this for January 12, 2010, at [facility].   I appreciate the opportunity of seeing the patient.   Ines Bloomer, M.[name].  Electronically Signed   DPB/MEDQ  [name]:  12/23/2009  T:  12/24/2009  Job:  [id number]

## 2010-05-25 NOTE — Assessment & Plan Note (Signed)
[facility]   [name], [name]                  ACCOUNT NO.:  [id number]   MEDICAL RECORD NO.:  [id number]      DATE OF BIRTH:  [birth year]   PHYSICIAN:  Maxwell Caul, M.D. VISIT DATE:  02/25/2008                                   OFFICE VISIT   Mr. Aversa returns from a protracted vacation.  He had ultimately run out  of wound care supplies and had been using Telfa and a topical gel under  his own compression stockings.  He is a gentleman with a refractory area  of venous stasis on his right medial leg.   On examination, the temperature is 97.9.  His wound area actually looks  satisfactory, good granulation and advancing epithelialization.  There  was some maceration of the skin around this probably secondary to the  Telfa.   IMPRESSION:  Venous stasis ulceration refractory.  I have put Puracol on  this wound and covered it with a dry dressing and put him back in an  Unna.  I wanted to control the edema and the degree of maceration around  the wound and hopefully this will heal over in short order.           ______________________________  Maxwell Caul, M.D.     MGR/MEDQ  D:  02/25/2008  T:  02/25/2008  Job:  [id number]

## 2010-05-25 NOTE — Assessment & Plan Note (Signed)
[facility]   NAMEFLOYD, [name]                  ACCOUNT NO.:  [id number]   MEDICAL RECORD NO.:  [id number]      DATE OF BIRTH:  [birth year]   PHYSICIAN:  Leonie Man, M.D.         VISIT DATE:                                   OFFICE VISIT   PROBLEM:  Recurrent and persistent right medial malleolar ulceration.   The ulcer is healing slowly but progressively with current therapy,  which includes Promogran, hydrogel, and Allevyn, placed on the wound 3  times weekly by the patient.  This is covered by an Allevyn pad, and the  patient wears a 30-40 mm stocking over this.   EXAMINATION:  The patient is afebrile with a temperature of 96.8, pulse  88, respirations 20, blood pressure is 196/101 today.  He is made aware  of his increased blood pressure.   On evaluation, the wound is about 95% fully granulated.  There is a very  small area that is not now epithelialized.   PLAN:  We will continue him on Promogran, hydrogel, cover this with an  Allevyn pad, and continue with his compression stockings.   DISPOSITION:  Follow up in 2 weeks.      Leonie Man, M.D.  Electronically Signed     PB/MEDQ  D:  05/19/2008  T:  05/19/2008  Job:  [id number]

## 2010-05-25 NOTE — Assessment & Plan Note (Signed)
[facility]   NAMEMAITLAND, LESIAK                  ACCOUNT NO.:  [id number]   MEDICAL RECORD NO.:  [id number]      DATE OF BIRTH:  10/17/1955   PHYSICIAN:  Theresia Majors. Tanda Rockers, M.D. VISIT DATE:  03/01/2007                                   OFFICE VISIT   SUBJECTIVE:  Mr. [name] is a 55 year old man with a stasis ulcer on the  medial aspect of his right lower extremity.  In the interim we have  treated him with an Radio broadcast assistant and is a Best boy.  There has been  no interim fever.  There has been less pain.  He continues to be  ambulatory.   OBJECTIVE:  Vital signs are blood pressure 160/80, respirations 16,  pulse rate 106, temperature 98.2.  Inspection of the lower extremity shows that the medial ulcer has  minimum exudate.  There is healthy-appearing granulation.  The pedal  pulse remains palpable.  The small area on the lateral foot has  similarly improved.   IMPRESSION:  Clinical improvement of stasis ulcer.   PLAN:  We will return the patient to [employer] and an Radio broadcast assistant.  We will reevaluate him by the nurse in 4 to 5 days and thereafter  weekly.  He will be seen every 2 weeks by the physician.      Harold A. Tanda Rockers, M.D.  Electronically Signed     HAN/MEDQ  D:  03/01/2007  T:  03/02/2007  Job:  [id number]

## 2010-05-25 NOTE — Assessment & Plan Note (Signed)
[facility]   NAMEZACHAREE, [name]                  ACCOUNT NO.:  [id number]   MEDICAL RECORD NO.:  [id number]      DATE OF BIRTH:  [birth year]   PHYSICIAN:  Theresia Majors. Tanda Rockers, M.D. VISIT DATE:  05/07/2007                                   OFFICE VISIT   SUBJECTIVE:  [name] is a 55 year old man who we have followed for  stasis ulcer involving his right lower extremity.  In the interim, he  continues to be ambulatory.  He has been placed in an Radio broadcast assistant with a  silver lined swath.  There has been no excessive drainage or mal odor.   OBJECTIVE:  Blood pressure is 159/92, respirations 16, pulse rate 90,  and temperature 98.1.  Inspection of the right lower extremity shows a  persistence of 2+ edema.  The ulcer itself shows a marginal decrease in  area on wound #1 and wound #2 is essentially resolved with a loosely  adherent eschar, which was easily removed.  Pedal pulse remains  palpable.  The patient underwent a curettage debridement under EMLA block.  There  were areas of subcutaneous fibrosis, which were excised with curette.  The resulting hemorrhage was controlled with direct pressure.  There was  a waxy exudate, which was completely removed from the wound area.  There  was no evidence of ascending infection.  The patient tolerated the  procedure well.   ASSESSMENT:  Stasis ulcer.   PLAN:  He was placed in an Radio broadcast assistant.  We will reevaluate the patient  weekly by the nurse and in 2 weeks by the physician.      Harold A. Tanda Rockers, M.D.  Electronically Signed     HAN/MEDQ  D:  05/07/2007  T:  05/08/2007  Job:  [id number]

## 2010-05-25 NOTE — Assessment & Plan Note (Signed)
[facility]   NAMESUDAIS, [name] NO.:  [id number]   MEDICAL RECORD NO.:  [id number]      DATE OF BIRTH:  1955-03-05   PHYSICIAN:  Maxwell Caul, M.D.      VISIT DATE:                                   OFFICE VISIT   HISTORY:  We have been following [name] for a variable refractory  area, venous stasis, ulceration on his right medial lower leg.  This  wound has had 3 Apligraf applications, and we have been making gradual  but very slow progress.  Most recently, he has been applying Silverlon  and hydrogel under a Profore.  He also uses Ultra Mide to his normal  skin.   PHYSICAL EXAMINATION:  On examination, temperature is 97.7.  The area on  the right medial leg measures 1.5 x 0.9 x 0.2.  This is a bridged area  of epithelialization.  I lightly debrided this of a mild amount of  fluff.  However, after some discussion with the patient, we elected to  go ahead with a fourth Apligraf application.  He expressed a strong  desire to have this closed as quickly as possible.  Previous Apligrafs  have resulted in a more noticeable improvement.   IMPRESSION:  Venous stasis ulceration, Apligraf applied today.  We will  see him in nurse visit next week and a physician visit in 2 weeks.           ______________________________  Maxwell Caul, M.D.     MGR/MEDQ  D:  12/31/2007  T:  12/31/2007  Job:  [id number]

## 2010-05-25 NOTE — Assessment & Plan Note (Signed)
[facility]   NAMEKENAN, [name]                  ACCOUNT NO.:  [id number]   MEDICAL RECORD NO.:  [id number]      DATE OF BIRTH:  Oct 01, 1955   PHYSICIAN:  Maxwell Caul, M.D. VISIT DATE:  10/29/2007                                   OFFICE VISIT   [name] is a gentleman that returns for an area of refractory venous  stasis on his right lower extremity, medially.  I fully reviewed his  treatment and measurements on a note of October 04, 2007.  Today, he  reports no additional symptoms.  He has mild to moderate drainage.  He  has no pain.  We have been using Puracol Ag to this wound with hydrogel  and a dry dressing.  He has Profore and Ultra-Mide.   On examination, his temperature is 97.7.  The wound measures 3.8 x 1.8 x  0.2.  The area of this was selectively debrided of what looks like some  mild biofilm.  He tolerated this well (see debridement sheet).  The  surrounding edge medially is rolled.  This may have something to do  with a lack of progression of his epithelialization; however, right now,  this did not need to be further debrided.   IMPRESSION:  Refractory venous stasis ulcer.  I have asked for a prior  approval of his third Apligraf.  I have changed him to Silverlon without  hydrogel (the wound does not look dry).  We have reused dry dressing,  Profore, and Ultra-Mide.  I will see him again in a week.           ______________________________  Maxwell Caul, M.D.     MGR/MEDQ  D:  10/29/2007  T:  10/29/2007  Job:  [id number]

## 2010-05-25 NOTE — Assessment & Plan Note (Signed)
[facility]   NAMEGABERIAL, CADA NO.:  [id number]   MEDICAL RECORD NO.:  [id number]      DATE OF BIRTH:  09-Mar-1955   PHYSICIAN:  Maxwell Caul, M.D.      VISIT DATE:                                   OFFICE VISIT   LOCATION:  [facility].   [name] is a gentleman we have been following for a difficult  refractory area of largely venous stasis ulceration on the right medial  aspect of his leg.  He has simply been applying a dry dressing in his  own compression stockings to this area.  The last time he was here 2  weeks ago, the surrounding area looked fairly macerated.  Therefore, I  discontinued his hydrogel.   On examination, he looks well.  The area is generally quite stable  looking and the maceration has resolved.  He still has too small areas  that I would not call resolved.  I lightly scraped the surface of both  of these wounds.   IMPRESSION:  Venous stasis ulceration, not quite resolved.  I did add  Puracol, a dry dressing under his own compression stockings.  We will  see him again in 2 weeks.  I am really hopeful that this will be closed  by that point.           ______________________________  Maxwell Caul, M.D.     MGR/MEDQ  D:  03/24/2008  T:  03/25/2008  Job:  [id number]

## 2010-05-25 NOTE — Assessment & Plan Note (Signed)
[facility]   NAMEREMIEL, CORTI                  ACCOUNT NO.:  [id number]   MEDICAL RECORD NO.:  [id number]      DATE OF BIRTH:  Feb 25, 1955   PHYSICIAN:  Leonie Man, M.D.    VISIT DATE:  04/14/2008                                   OFFICE VISIT   PROBLEM:  Venous leg ulcer with medial malleolar ulcer and chronic  venous stasis disease.   HISTORY:  A 55 year old white male status post vein strippings and  repeated injections of incompetent perforators through the [facility] who is currently being treated for a recurrent venous stasis  ulcer located above the medial malleolus of the right lower extremity.   CURRENT THERAPY:  His most recent treatment has been with Puracol,  hydrogel and compression hose 30-40 mm pressure.  He is doing well with  this regimen.   EXAMINATION:  He is afebrile.  Temperature is 98.4, pulse 84,  respirations 20, blood pressure is elevated at 193/93.   The right leg ulcer located on the medial malleolar surface measures 0.3  x 0.5 x less than 0.1 with some surrounding slough and xerosis and some  very minimal exudate and good pink granulations.   ASSESSMENT:  Improving venous stasis ulcer in the background of a  chronic venous stasis disease with recurrent ulceration.   TREATMENT TODAY:  Puracol, hydrogel, dry sterile dressing and  compression stockings.  He is referred now to [facility] for  reevaluation to see if he has any additional vein perforators that may  require ablation.   DISP/ We will follow up with him again in approximately 2 weeks.      Leonie Man, M.D.  Electronically Signed     PB/MEDQ  D:  04/14/2008  T:  04/14/2008  Job:  [id number]

## 2010-05-25 NOTE — Assessment & Plan Note (Signed)
[facility]   NAMELISSANDRO, DILORENZO                  ACCOUNT NO.:  [id number]   MEDICAL RECORD NO.:  [id number]      DATE OF BIRTH:  1955-07-17   PHYSICIAN:  Joanne Gavel, M.D.              VISIT DATE:                                   OFFICE VISIT   PROBLEM:  This patient has recurrence of a small ulceration at a  previously healed site on his right ankle.  The recurrence is  approximately 0.3 x 0.5 x 0.2.  It is relatively superficial secondary  to venous stasis.   PHYSICAL EXAMINATION:  Pulse and blood pressure stable.   ASSESSMENT:  Recurrent small venous stasis ulcer.   TREATMENT:  The patient was dressed with 4% Xylocaine and after the  dressing was removed, the partial thickness debridement of skin and base  of ulcer was performed with a scalpel.  There was minimal bleeding.   TREATMENT AND PLAN:  We will apply Promogran, hydrogel,  to the  dressing, and he will change this every other day and he is wearing  pressure hose.   FOLLOWUP:  He will be seen in 7 days.      Joanne Gavel, M.D.  Electronically Signed     RA/MEDQ  D:  04/28/2008  T:  04/28/2008  Job:  [id number]

## 2010-05-25 NOTE — Letter (Signed)
January 19, 2010   [name], MD  [address]  [place], [place] [address]   Re:  [name], [name]                  DOB:  11-06-1955   Dear Gala Romney:   I saw the patient back after his bronchoscopy with endobronchial  ultrasound.  At the time we did his surgery, we thought that we had  enough tissue for a diagnosis and I did not do mediastinoscopy, but  unfortunately final report did not give [place] a firm diagnosis.  We did  flow cytometry on his needle aspirations and it is highly suggestive  that he has a lymphoproliferative disorder or probably even lymphoma,  but they would not give me a definite diagnosis, so I am going to plan  to get a PET scan on him to look at which nodes are the best to biopsy  and then I will proceed back with either a scalene node or  mediastinoscopy nodes. I discussed this with the patient and he agrees.  His blood pressure was 140/80, pulse 71, respirations 18, sats were 95%.   Ines Bloomer, M.[name].  Electronically Signed   DPB/MEDQ  [name]:  01/19/2010  T:  01/20/2010  Job:  [id number]

## 2010-05-25 NOTE — Assessment & Plan Note (Signed)
[facility]   NAMEJAYKWON, MORONES                  ACCOUNT NO.:  [id number]   MEDICAL RECORD NO.:  [id number]      DATE OF BIRTH:  Jan 21, 1955   PHYSICIAN:  Theresia Majors. Tanda Rockers, M.D. VISIT DATE:  06/05/2007                                   OFFICE VISIT   SUBJECTIVE:  Mr. Helm is a 55 year old man who we are following for  stasis ulcer involving his right medial ankle.  In the interim, we have  treated him with compression wrap.  He returns for followup.  He has had  decreased drainage and no malodor and no fever.  He continues to be  ambulatory.  The patient is leaving the area for a week at the beach  starting tomorrow.   OBJECTIVE:  Blood pressure is 173/90, respirations 18, pulse rate 82,  and temperature is 98.1.  Inspection of the right medial ankle shows  that the ulcer has clinically improved with 100% granulation.  There is  a moderate amount of maceration and moisture.  There is no evidence of  ascending infection, lymphangitis, or cellulitis.  Capillary refill is  brisk.  The dorsalis pedis pulse is bounding.   ASSESSMENT:  Clinical improvement of stasis ulcer.   PLAN:  We will return the patient to a multilayer compression wrap.   With regard to his vacation at the beach, the patient has been  instructed not to go into the ocean with the Unna boot in place;  however, if he would like to go into the surf, he may remove the [employer].  He should take care to bathe the leg or shower with antibacterial  soap, i.e., Liquid Dial after each exposure.  He may use a nonadherent  dressing with an Ace wrap to maintain the compression while walking or  doing his ambulatory periods.  He should not have fever.  He should not  notice red streaks in his leg and should not have increasing pain.  If  any of these symptoms and signs should occur, he should feel free to  visit the local emergency room or receive priority evaluation at a local  wound center.  I have  advised extreme caution.  There is a chance of  infectious exposure. He has also been encouraged to call the clinic if  he has questions during the operating hours.  We have given an  opportunity to ask questions.  He seems to understand and indicates that  he will be compliant.      Harold A. Tanda Rockers, M.D.  Electronically Signed     HAN/MEDQ  D:  06/05/2007  T:  06/06/2007  Job:  [id number]

## 2010-05-25 NOTE — Assessment & Plan Note (Signed)
[facility]   NAMETYLEEK, SMICK NO.:  [id number]   MEDICAL RECORD NO.:  [id number]      DATE OF BIRTH:  1955-07-31   PHYSICIAN:  Maxwell Caul, M.D. VISIT DATE:  10/04/2007                                   OFFICE VISIT   Mr. Costlow returns today for a refractory area of stasis ulceration on the  right lower extremity medially.  I have fully reviewed our treatment of  this, which has included some Apligraf on July 16, 2007, and September 03, 2007.  It would appear that we have taken the overall dimensions of this  wound from 6 x 3.5 on April 23, 2007, down to 5 x 3 x 0.5 at the time of  his first Apligraf on July 16, 2007, and then on September 03, 2007, his  dimensions were 4.4 x 0.2 at which time we placed Apligraf #2.  Currently, he is 3.6 x 1.5.  The base of this wound underwent an  excisional debridement to remove nonviable bile burden subcutaneous  material that did not look viable to me.  After the debridement, the  wound looked considerably better with healthy granulating base.   PHYSICAL EXAMINATION:  VITAL SIGNS:  Temperature was 97.5.   The wound as described above.  At the debridement as noted above (see  debridement sheet).   IMPRESSION:  Refractory venous stasis ulcer currently measuring 3.6 x  1.5 x 0.1.  This underwent a careful debridement as noted above.  I have  put Puracol Plus silver on this wound with hydrogel under Profore wrap.  Next week when he returns, I will make a decision about a third  Apligraf.           ______________________________  Maxwell Caul, M.D.     MGR/MEDQ  D:  10/04/2007  T:  10/04/2007  Job:  [id number]

## 2010-05-25 NOTE — Assessment & Plan Note (Signed)
[facility]   [name], [name] NO.:  [id number]   MEDICAL RECORD NO.:  [id number]      DATE OF BIRTH:  [birth year]   PHYSICIAN:  Maxwell Caul, M.D. VISIT DATE:  10/11/2007                                   OFFICE VISIT   HISTORY:  [name] returns today for review of a refractory area of  venous stasis on the right lower extremity medially.  I fully reviewed  his treatments and measurements on my last note of October 04, 2007.  Today, he reports no additional symptoms.  No pain or drainage is noted.   PHYSICAL EXAMINATION:  Temperature 97.6, pulse 80, respirations 20, and  blood pressure 195/114 (this was discussed with the patient).  His wound  dimensions are roughly the same at 4 x 1.9 as they were last week.  He  underwent a selective debridement of some covering eschar.  Underneath  this, the granulation looks healthy.  There is advancing  epithelialization.  Therefore, I have continued this current treatment.   IMPRESSION:  Refractory venous stasis ulcer.  I have reapplied Puracol  AG, hydrogel, and a dry dressing under a Profore.  I will make a  decision next week about another Apligraf.           ______________________________  Maxwell Caul, M.D.     MGR/MEDQ  D:  10/11/2007  T:  10/11/2007  Job:  [id number]

## 2010-05-25 NOTE — Assessment & Plan Note (Signed)
[facility]   NAMEROBEL, WUERTZ                  ACCOUNT NO.:  [id number]   MEDICAL RECORD NO.:  [id number]      DATE OF BIRTH:  Jul 13, 1955   PHYSICIAN:  Maxwell Caul, M.D. VISIT DATE:  04/23/2007                                   OFFICE VISIT   HISTORY:  Mr. Valdes returns today for followup on his venous stasis ulcer  involving the right lower extremity on the medial aspect.  In the  interim, he has been wearing Unna boots.  He does admit to some  swelling, however, and mild drainage, but no pain or fever.   PHYSICAL EXAMINATION:  VITAL SIGNS:  Temperature is 98.5, pulse 75,  respirations 18, blood pressure was elevated at 189/103.  EXTREMITIES:  His right medial lower leg wound measures 6 x 3.5 x 0.2.  These dimensions are improved.  The base of this appeared to be  granulating.  There is some eschar here that may soon need to be  debrided.  However, there was no evidence of cellulitis.  The small  wound on the right lateral foot that was covered by a callus, which was  removed manually.  No anesthesia or hemostasis was necessary.   IMPRESSION:  Continued improvement of venous stasis ulcer.  In order to  reduce the risk of bio-burden, I have applied a silver wound dressing to  this and continue with the Unna wrap.  He will be seen by the nurse next  week and the physician in 2 weeks.           ______________________________  Maxwell Caul, M.D.     MGR/MEDQ  D:  04/23/2007  T:  04/24/2007  Job:  [id number]

## 2010-05-25 NOTE — Assessment & Plan Note (Signed)
[facility]   [name], [name]                  ACCOUNT NO.:  [id number]   MEDICAL RECORD NO.:  [id number]      DATE OF BIRTH:  [birth year]   PHYSICIAN:  Maxwell Caul, M.D. VISIT DATE:  09/10/2007                                   OFFICE VISIT   Mr. [name] is seen today 1 week post Apligraf.  He was undressed down to  Mepitel.  He has had some drainage but no pain or malodor.  He remained  systemically well.   IMPRESSION:  Refractory venous stasis ulcer.  He is here for a wound  check.  The Mepitel was not removed.  We will see him again next week.           ______________________________  Maxwell Caul, M.D.     MGR/MEDQ  D:  09/10/2007  T:  09/11/2007  Job:  [id number]

## 2010-05-25 NOTE — Consult Note (Signed)
NAMEDESEAN, [name]                  ACCOUNT NO.:  [id number]   MEDICAL RECORD NO.:  [id number]          PATIENT TYPE:  REC   LOCATION:  FOOT                         FACILITY:  [facility]   PHYSICIAN:  Harold A. Tanda Rockers, M.D.DATE OF BIRTH:  1955-05-22   DATE OF CONSULTATION:  02/26/2007  DATE OF DISCHARGE:                                 CONSULTATION   REASON FOR CONSULTATION:  [name] is a 55 year old man referred by Dr.  Casimiro Needle Crush for evaluation of an ulceration on the right medial leg.   IMPRESSION:  Chronic stasis ulceration.   RECOMMENDATIONS:  Proceed with debridement under eustatic anesthetic  mixture block followed by  compression protocol.  The patient will  require frequent compression wrap changes due to a previous Pseudomonas  infection.   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  He has been under  continuous management of his stasis by Dr. Cristy Friedlander and most recently he  has developed this ulceration of the right medial malleolus which has  extended and has been slow to respond to compressive therapy.  Two weeks  ago, he had a positive culture for Pseudomonas and was started on a  course of Cipro.  His ulcer has continued.  He has had some local pain,  but he has had no systemic response with regard to fever or chills.  He  has had some malodor related to the drainage.   PAST MEDICAL HISTORY:  Remarkable for hypertension.   CURRENT MEDICATIONS:  1. Centrum Silver.  2. Omega 3.  3. Aspirin.  4. Lisinopril 20 mg daily.  5. Benicar 40/25 daily.  6. Cipro course which he has recently completed.  7. Iodosorb topical.   ALLERGIES:  PENICILLIN which gives him a rash.   PAST SURGICAL HISTORY:  1. Hernia repair.  2. He has had multiple vein strippings.   FAMILY HISTORY:  Positive for hypertension, hypercholesterolemia.  Negative for diabetes.   SOCIAL HISTORY:  He is single.  He is employed as a  Interior and spatial designer.   REVIEW OF SYSTEMS:  He specifically denies angina pectoris, transient  visual losses, paralysis or other stigmata of TIAs.  He smokes cigars.  He does not have a chronic cough.  He has never had hemoptysis.  He has  never complained of chest pain and has never been evaluated for  cardiac  abnormalities.  There are no bowel or bladder complaints.  His weight is  stable.  The remainder of the review of systems is negative.   PHYSICAL EXAMINATION:  GENERAL:  He is an alert, oriented man in no  acute distress.  VITAL SIGNS:  Blood pressure is 177/90, respirations 22, pulse rate 94,  temperature 98.  HEENT:  Clear.  NECK:  Supple.  Trachea is midline.  Thyroid is nonpalpable.  LUNGS:  Clear.  HEART:  Sounds are normal.  ABDOMEN:  Soft.  EXTREMITIES:  Abnormal.  There are bilateral changes of chronic stasis.  The pedal pulses are +3 bilaterally.  There is a rather large ulceration  on the right medial malleolus with frank sloughing and necrosis of skin.  The wound is surrounded by a halo of erythema.  There is local warmth.  There is no ascending cellulitis or abscess formation. The patient  retains protective sensation.   PROCEDURE:  Under an eustatic anesthetic mixture, an excisional  debridement of all dead necrotic skin.  Subcutaneous tissue was sharply  excised using a curette and a 10-blade.  Hemorrhage was controlled with  direct pressure.  Thereafter, a multilayer compressive dressing  utilizing silver gel and a Silverlon swath were applied directly to the  wound.   ASSESSMENT:  Stasis ulcer adequately debrided.   DISCUSSION:  Mr. Morina has a classic stasis ulcer with occupational risk  factors including prolonged standing.  The patient has worn compressive  hose in the past, but apparently these have been inadequate relative to  his level of dependency.  His ulcer has been adequately debrided.  We  will change his dressing more frequently due to his history of   Pseudomonas.  We will reevaluate him in 3-4 days for his dressing  change.  We have given the patient an opportunity to ask questions.  He  seems to understand his diagnosis and his treatment plan.  He expresses  gratitude for having been seen in the clinic and indicates that he will  be compliant with the aforementioned instruction.      Harold A. Tanda Rockers, M.D.  Electronically Signed     HAN/MEDQ  D:  02/26/2007  T:  02/27/2007  Job:  [id number]   cc:   Alyson Locket, MD  [name], M.D.

## 2010-05-25 NOTE — Assessment & Plan Note (Signed)
[facility]   NAMEKEROLOS, NEHME                  ACCOUNT NO.:  [id number]   MEDICAL RECORD NO.:  [id number]      DATE OF BIRTH:  1955-06-28   PHYSICIAN:  Maxwell Caul, M.D. VISIT DATE:  01/14/2008                                   OFFICE VISIT   Mr. Oaxaca is a gentleman who we have been following for a very refractory  area of venous stasis ulceration on his right medial lower leg.  The  wound is now status post 4 Apligraf applications, most recently 2 weeks  ago.  We have been making gradual progress, each Apligraf is stimulated  its own degree of healing.   On examination, he is afebrile.  The area on the right medial leg is  almost 100% epithelialized except for the superior aspect.  No evidence  of infection here.  This was not debrided today.   IMPRESSION:  Venous stasis ulceration, now 2 weeks status post fourth  Apligraf.   The most of the wound is 100% epithelialized.  I have applied Puracol,  hydrogel continued under his Profore to control the edema.  He is  leaving on vacation in 10 days' time.  I will talk to him about this  next week.  A lot of what we recommend here will depend on the status of  the wound a week from now.           ______________________________  Maxwell Caul, M.D.     MGR/MEDQ  D:  01/14/2008  T:  01/14/2008  Job:  [id number]

## 2010-05-25 NOTE — Assessment & Plan Note (Signed)
[facility]   NAMECHIDERA, THIVIERGE                  ACCOUNT NO.:  [id number]   MEDICAL RECORD NO.:  [id number]      DATE OF BIRTH:  1955/07/05   PHYSICIAN:  Leonie Man, M.D.    VISIT DATE:  06/16/2008                                   OFFICE VISIT   PROBLEM:  Recurrent venous leg ulcer over the right medial malleolus.  The current ulcer size is down to 0.1 x 0.1 x 0.1 on today's evaluation.  Current treatment has been Promogran, hydrogel, and a compression  stocking.  We have been seeing this patient on to weekly basis.  The  wounds are essentially 99% epithelialized except for a very small 0.1 cm  opening at the base of which there is clean pink granulations.  The  patient is not having any pain.  There is no odor or discharge.   EXAM  On today's examination, temperature is 98, respirations 16, pulse 88,  blood pressure 176/92.  There is no surrounding edema or erythema of the  leg.   ASSESSMENT:  Excellent progression of wound closure in this 55 year old  gentleman with venous stasis disease.  We will continue him on  Promogran, hydrogel, and a compression hose to be changed every 3 days  and follow up in 2 weeks at which time I will anticipate he should be  able to be discharged from our care.      Leonie Man, M.D.  Electronically Signed     PB/MEDQ  D:  06/16/2008  T:  06/16/2008  Job:  [id number]

## 2010-05-25 NOTE — Group Therapy Note (Signed)
NAMEJIVAN, [name]                  ACCOUNT NO.:  [id number]   MEDICAL RECORD NO.:  [id number]          PATIENT TYPE:  REC   LOCATION:  FOOT                           FACILITY:   PHYSICIAN:  Lenon Curt. Chilton Si, M.D.  DATE OF BIRTH:  Nov 15, 1955                                 PROGRESS NOTE   WOUND CARE CENTER NOTE   HISTORY:  [name] is followed for refractory venous stasis ulceration of the  right medial malleolar area.  He had an Apligraf placed on July 16, 2007.  This appears to have worked its way off now.  His wound is a roughly  triangular wound measuring 3 cm across the top, 1.4 cm across the bottom  and 5.2 cm along its longitudinal axis.  There is erythema of his leg  from the forefoot to the  knee.  There is a pinpoint leakage of tissue  fluid.  There is a large amount of scaling.  He has had a [place] boot  applied over silver compound for the last week.  The patient denies any  pain in this area.  The wound does have some granulation tissue present.   PAST MEDICAL HISTORY:  Additional medical history indicates that he is not diabetic.  He is  hypertensive.  He has had four venous stasis ulcers in the last 25  years.   SOCIAL HISTORY:  He states that he wears compressing stockings on both legs daily.  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  He  continues to smoke despite advice in the past to stop.   CURRENT MEDICATIONS:  Benicar 40/25 daily.   PLAN:  New orders today include obtainment of ankle brachial indices.  He does  have a palpable pulse in this leg.  We also want to do a wound culture  for bio burden.   Treatment today will be to apply silver alginate and a Profore dressing  on top of that.  The patient states he is going to the beach and will  remove this at the beach and come in next week for followup and probable  application of the Apligraf again.      Lenon Curt Chilton Si, M.D.  Electronically Signed     AGG/MEDQ  D:   08/17/2007  T:  08/17/2007  Job:  [id number]

## 2010-05-25 NOTE — Assessment & Plan Note (Signed)
[facility]   NAMEHAWKINS, SEAMAN                  ACCOUNT NO.:  [id number]   MEDICAL RECORD NO.:  [id number]      DATE OF BIRTH:  [birth year]   PHYSICIAN:  Theresia Majors. Tanda Rockers, M.D. VISIT DATE:  03/26/2007                                   OFFICE VISIT   SUBJECTIVE:  [name] is a 55 year old man who we are following for a  stasis ulcer.  In the interim we treated him with a compression wrap.  He continues to be ambulatory.  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  He reports some evening tightness in his leg but otherwise there has  been no fever or excessive drainage.   OBJECTIVE:  Blood pressure is 190/87, respirations 20, pulse rate 89,  temperature is 98.1.  Inspection of the right lower extremity shows that there has been a  significant decrease in the area of the ulceration.  The wound is clean.  There is healthy-appearing granulation with advancing epithelium from  the periphery.The pedal pulses are 3+.  There is a persistence of 1-2+  edema bilaterally with chronic changes of stasis.   ASSESSMENT:  Clinical improvement of stasis ulcer with compression.   PLAN:  We will resume the Unna wrap.  The nurse will reevaluate it  weekly.  The physician will see him every 2 weeks.      Harold A. Tanda Rockers, M.D.  Electronically Signed     HAN/MEDQ  D:  03/26/2007  T:  03/26/2007  Job:  [id number]

## 2010-05-25 NOTE — Assessment & Plan Note (Signed)
[facility]   NAMEBENICIO, [name]                  ACCOUNT NO.:  [id number]   MEDICAL RECORD NO.:  [id number]      DATE OF BIRTH:  1955/08/31   PHYSICIAN:  Maxwell Caul, M.D. VISIT DATE:  03/03/2008                                   OFFICE VISIT   [name] is a gentleman we have been following for a difficult wound on  the right medial lower leg.  This occurs in the setting of really  prominent venous insufficiency and varicose veins.  He did nicely with a  series of four Apligrafs.  He returned last week from vacation and the  area actually looked quite satisfactory.  We applied Puracol, hydrogel  under an Unna wrap.   He returns today with a temperature of 97.7, blood pressure is 162/90.  The area in question is fully epithelialized and really should close  over.  There is no evidence of surrounding infection.   IMPRESSION:  Venous stasis ulceration.  I had given him the ability to  come out of the Unna wraps.  We will simply use hydrogel, a dry dressing  under his own compression stockings.  I would like to see this again in  a week to see if we are finally getting closure of these wounds.           ______________________________  Maxwell Caul, M.D.     MGR/MEDQ  D:  03/03/2008  T:  03/03/2008  Job:  [id number]

## 2010-05-25 NOTE — Assessment & Plan Note (Signed)
[facility]   [name], KISS NO.:  [id number]   MEDICAL RECORD NO.:  [id number]      DATE OF BIRTH:  1955/03/21   PHYSICIAN:  Maxwell Caul, M.D. VISIT DATE:  01/21/2008                                   OFFICE VISIT   HISTORY:  [name] is a gentleman who we have been following for a very  refractory area of venous stasis ulceration on his right medial lower  leg.  This occurs in the setting of really prominent venous  insufficiency and varicose veins.  We most recently applied his fourth  Apligraf 3 weeks ago.  We have making gradual progress with each  Apligraf stimulating its own degree of healing.   On examination, he is afebrile.  Fortunately, the area on the right  medial leg is 100% epithelialized in all but 2 small areas.  The rest of  this wound is almost completely resolved.   IMPRESSION:  Venous stasis ulceration, now 3 weeks post fourth Apligraf.  This continues to make predictable improvement.  It is almost totally  resolved except for small areas at the superior and inferior recess of  this wound.  There is no evidence of infection.  His edema control is  good.   [name] is actually going on a cruise next weekend.  I have reapplied  Puracol, hydrogel, and a Profore wrap.  I have advised him to fly with  this on to mobilize freely while he is in flight.  On either Friday  night or Saturday morning before he actually goes on the cruise, the  Profore can be removed.  I think he will then use just antibiotic cream,  dry dressings, and his own graded pressure stockings, which he is  getting today.  With any luck and edema control, I think all of this  should resolve on its own.  We will see him when he returns.           ______________________________  Maxwell Caul, M.D.     MGR/MEDQ  D:  01/21/2008  T:  01/21/2008  Job:  [id number]

## 2010-05-25 NOTE — Assessment & Plan Note (Signed)
[facility]   NAMEKEKAI, GETER                  ACCOUNT NO.:  [id number]   MEDICAL RECORD NO.:  [id number]      DATE OF BIRTH:  13-Aug-1955   PHYSICIAN:  Theresia Majors. Tanda Rockers, M.D. VISIT DATE:  05/21/2007                                   OFFICE VISIT   SUBJECTIVE:  Mr. Thammavong is a 55 year old man who we are following for a  right medial stasis ulcer.  In the interim, we have treated him with an  Radio broadcast assistant.  He has complained of some itching and he has scratched his  medial leg in the interim.  He has complained of some excessive drainage  and slight malodor.  There has been no fever.  He continues to be  ambulatory and in fact has had extended periods of ambulation given rise  to swelling.   OBJECTIVE:  VITAL SIGNS:  Blood pressure is 181/98, respirations 18,  pulse rate 86, and temperature is 98.  EXTREMITIES:  Inspection of the right medial lower extremity shows that  there is persistence of 2+ edema.  There is hyperemia and moist weeping  in the periwound area.  The wound is intensely hyperemic, but it is not  particularly tender.  There is no excessive malodor.  Capillary refill  is brisk.  The pedal pulses palpable.  There is no evidence of a  ascending lymphangitis or abscess.   ASSESSMENT:  Inadequate fluid control/edema control, stasis ulcer.   PLAN:  We will increase the wraps to twice a week per the nurse with the  physician seeing the patient in 2 weeks.  We have encouraged him to  exercise periodic elevation of the leg.  He seems to understand these  instructions and indicates that he would be compliant.      Harold A. Tanda Rockers, M.D.  Electronically Signed     HAN/MEDQ  D:  05/21/2007  T:  05/22/2007  Job:  [id number]

## 2010-05-25 NOTE — Assessment & Plan Note (Signed)
[facility]   NAMEMILT, COYE NO.:  [id number]   MEDICAL RECORD NO.:  [id number]      DATE OF BIRTH:  Mar 30, 1955   PHYSICIAN:  Maxwell Caul, M.D. VISIT DATE:  11/05/2007                                   OFFICE VISIT   Mr. [name] arrives today for followup of his refractory venous stasis  ulcerations.  His clinical course has been well documented in my  previous notes.  He has had 2 Apligrafs with some improvement in wound  dimensions; however, he continues to have a refractory area of venous  stasis on the right lower extremity medially.  I have had various  combinations of silver, collagen on this wound and he continues to have  moderate amounts of drainage without evidence of infection.  There is no  pain noted.   On examination, his temperature is 97.8.  The wound dimensions are  essentially the same at 3.9 x 1.5 x 0.4.  There is nothing in the wound  bed that needs dividing today.  There is no evidence of infection or  ischemia.  I have applied silver alginate to this to help with the  drainage.  We are pending a third Apligraf application.  If this is  denied, I am going to pursue Oasis.  Otherwise, I will try a third  Apligraf application and reserve Oasis if we do not get total closure.           ______________________________  Maxwell Caul, M.D.     MGR/MEDQ  D:  11/05/2007  T:  11/05/2007  Job:  [id number]

## 2010-05-25 NOTE — Assessment & Plan Note (Signed)
[facility]   NAMEEASTEN, MACEACHERN                  ACCOUNT NO.:  [id number]   MEDICAL RECORD NO.:  [id number]      DATE OF BIRTH:  [birth year]   PHYSICIAN:  Theresia Majors. Tanda Rockers, M.D. VISIT DATE:  07/02/2007                                   OFFICE VISIT   SUBJECTIVE:  [name] is a 55 year old man who we follow for stasis  ulcer on his right medial ankle.  In the interim, we have treated him  with compressive wraps.  He continues to be ambulatory.  He continues to  work his job which requires prolonged standing.  There has been no  interim fever, chest pain, or tachycardia.   OBJECTIVE:  Blood pressure is 160/87, respirations 18, pulse rate 99.  He is afebrile.  Inspection of the right medial leg shows that the ulcer  has contracted significantly.  There is moderate amount of desquamation  and maceration related to a moist dressing.  These areas were debrided  selectively with EMLA cream as an anesthetic.  There was no hemorrhage.  Capillary refill is brisk.  The dorsalis pedis pulses were readily  palpable.There is associated 2+ edema with chronic changes of stasis  palpable pulse.  No evidence of active infection or ascending infection  or abscess.  There is no evidence of ischemia.  The wound appears to be  clean.  There is no evidence of infection.   ASSESSMENT:  Stasis ulcer, responding to external compression.   PLAN:  We have recommended that the patient be precertified for an  Apligraf placement to expedite the closure of this wound.      Harold A. Tanda Rockers, M.D.  Electronically Signed     HAN/MEDQ  D:  07/02/2007  T:  07/03/2007  Job:  [id number]

## 2010-05-25 NOTE — Assessment & Plan Note (Signed)
[facility]   NAMESANJEEV, MAIN NO.:  [id number]   MEDICAL RECORD NO.:  [id number]      DATE OF BIRTH:  05-12-1955   PHYSICIAN:  Maxwell Caul, M.D. VISIT DATE:  07/16/2007                                   OFFICE VISIT   Mr. Esguerra is a gentleman who we have been following for stasis ulceration  on his right medial ankle.  He has been treated with compression wraps.  He continues to be employed working and ambulatory including his job  which requires prolonged standing.  He has not had any interim fever,  pain, or drainage.   PHYSICAL EXAMINATION:  The ulcer on his right medial ankle had an eschar  to it.  We applied EMLA and removed the slough and eschar using a #10  blade.  Hemostasis was with direct pressure.  Once the wound bed was  properly prepared and looked healthy, we went ahead and applied the  Apligraf covered with Mepitel and fixed with Steri-Strips.  We then  reapplied his Unna boot.   IMPRESSION:  Venous stasis ulcer debrided and he had Apligraf placement  as noted above.  He has a small linear ulceration which is probably  related to the Unna, however, I do not expect this will cause much  trouble and should heal with re-wrapping.  We will see him again in a  week as per standard protocol.           ______________________________  Maxwell Caul, M.D.     MGR/MEDQ  D:  07/16/2007  T:  07/17/2007  Job:  [id number]

## 2010-05-25 NOTE — Assessment & Plan Note (Signed)
[facility]   NAMEMIKI, BLANK                  ACCOUNT NO.:  [id number]   MEDICAL RECORD NO.:  [id number]      DATE OF BIRTH:  1955-12-10   PHYSICIAN:  Maxwell Caul, M.D. VISIT DATE:  12/24/2007                                   OFFICE VISIT   Mr. Degante is a gentleman who we have been following for a refractory  venous stasis ulcer in his right medial lower leg.  He has had 3  Apligraf placed most recently on November 12, 2007.  He has made progress  toward wound closure, albeit slowly.   On examination, he is afebrile.  The wound itself measures 1.6 x 1.1 x  0.2.  This is unchanged from last week.  He underwent a light  debridement of some slough and callus; however, there was no evidence of  infection, and the wound really looks quite healthy with progressive  epithelialization, especially on its inferior aspects.   IMPRESSION:  Refractory venous stasis ulcer.  We have reapplied  Silverlon and hydrogel under a Profore, Ultra Mide to his normal skin  which helps with generalized itching.  I have asked for another Apligraf  although I am not exactly sure that this will be necessary.  He is  planning to go on a cruise in 3 weeks, would like closure by then, if  possible.  I will see him again next week.           ______________________________  Maxwell Caul, M.D.     MGR/MEDQ  D:  12/24/2007  T:  12/24/2007  Job:  [id number]

## 2010-05-25 NOTE — Assessment & Plan Note (Signed)
[facility]   NAMEJAYKE, CAUL                  ACCOUNT NO.:  [id number]   MEDICAL RECORD NO.:  [id number]      DATE OF BIRTH:  Aug 02, 1955   PHYSICIAN:  Maxwell Caul, M.D. VISIT DATE:  03/10/2008                                   OFFICE VISIT   [name] is a gentleman who we have been following for a difficult  refractory area of largely venous stasis ulceration on the right medial  aspect of his lower leg.  This is in the setting of really prominent  venous insufficiency and varicose veins.  He required a series of 4  Apligraf to get this to the situation we are in currently, which is very  close to resolve.  We have been using hydrogel under his own compression  stockings.   On examination, he is afebrile with a temperature of 98.  The wound is  100% epithelialized but not completely resolved.  There was some  surrounding maceration, and I really thought that the overall area is  probably too moist.  I have, therefore, changed him into simple dry  dressing under his own compression hose.  I will see him back in 2  weeks.  I am hopeful at that point, he will be resolved.           ______________________________  Maxwell Caul, M.D.     MGR/MEDQ  D:  03/10/2008  T:  03/10/2008  Job:  [id number]

## 2010-05-25 NOTE — Assessment & Plan Note (Signed)
[facility]   NAMEAJAI, [name]                  ACCOUNT NO.:  [id number]   MEDICAL RECORD NO.:  [id number]      DATE OF BIRTH:  1955/12/31   PHYSICIAN:  Maxwell Caul, M.D. VISIT DATE:  12/03/2007                                   OFFICE VISIT   [name] had an Apligraf placed 3 weeks ago which I believe is actually  his third Apligraf.  He has since been receiving Mepitel, Ultra Mide,  and a Profore Lite dressing.  He returns today in followup.  He is not  having pain.  The drainage is minimal.   PHYSICAL EXAMINATION:  His wound has contracted nicely.  There was a  bridge of epithelium now separating this into a small inferior wound and  a larger superior wound.  The area was lightly debrided of some eschar.  The edges were lightly debrided as well to hopefully stimulate more  epithelialization.  Some periwound callus was also removed.   IMPRESSION:  Refractory venous stasis ulceration in the setting of  venous stasis and chronic edema.  I note that previously he seems to  have had a response to silver alginate type dressings.  He does not have  enough drainage to warrant that currently.  I am going to therefore  applied Silverlon, hydrogel, and Ultra Mide to the surrounding skin and  returned him to a Profore dressing.  He reiterates that he is going on a  cruise in 6 weeks and would like to have this resolved.  We will do our  best to accommodate him.           ______________________________  Maxwell Caul, M.D.     MGR/MEDQ  D:  12/03/2007  T:  12/03/2007  Job:  [id number]

## 2010-05-25 NOTE — Assessment & Plan Note (Signed)
[facility]   NAMEKOOPER, GODSHALL NO.:  [id number]   MEDICAL RECORD NO.:  [id number]      DATE OF BIRTH:  1955/01/17   PHYSICIAN:  Maxwell Caul, M.D. VISIT DATE:  07/30/2007                                   OFFICE VISIT   [name] is a gentleman we have been following for refractory venous  stasis ulceration on the right medial ankle.  He had an Apligraf placed  on July 6.  He was inspected in our Nurses Clinic last week and he  returns this week in followup.  He has not had any interim pain, fever,  or drainage.   On examination, temperature is 97.7, pulse 88, respirations 18, blood  pressure is 159/93.  The wound measures 4.6 x 1.3 x 0.3, all of this  represents a fairly good reduction from his previous values.  The wound  bed is granulated.  There is nice advancing epithelialization.   IMPRESSION:  Refractory venous stasis ulcer.  There is good improvement  now 2 weeks status post Apligraf.  I have applied Silverlon to control  bioburden and continued with his Unna wrap.  We will see him again in  the Nurses Clinic in a week's time.           ______________________________  Maxwell Caul, M.D.     MGR/MEDQ  D:  07/30/2007  T:  07/31/2007  Job:  [id number]

## 2010-05-25 NOTE — Assessment & Plan Note (Signed)
[facility]   NAMENIVAAN, DICENZO                  ACCOUNT NO.:  [id number]   MEDICAL RECORD NO.:  [id number]      DATE OF BIRTH:  07/27/1955   PHYSICIAN:  Theresia Majors. Tanda Rockers, M.D. VISIT DATE:  03/19/2007                                   OFFICE VISIT   SUBJECTIVE:  [name] is a 55 year old man who we are treating for a  stasis ulcer involving the medial and lateral aspect of the right lower  extremity.  In the interim, he has worn an Radio broadcast assistant.  There has been  moderate drainage with rank malodor.  There has been no pain and there  is been no fever, however.   OBJECTIVE:  Blood pressure is 176/106, respirations 18, pulse rate 88,  temperature 97.7.  Inspection of the right lower extremity shows that  there are areas of necrosis on the medial wound.  The lateral wound  shows contraction with healthy granulation.  Emla  anesthetic cream was  placed and thereafter, the patient underwent an excisional debridement  with a curette, necrotic tissue including skin and levels of the  subcutaneous stratum were removed without difficulty.  The resulting  hemorrhage was controlled with direct pressure.  The patient was  comfortable throughout the debridement.  The pedal pulse remains  palpable.  There is associated 2+ edema.   ASSESSMENT:  Clinical improvement of stasis ulcers right lower  extremity.   PLAN:  We will return the patient to an Radio broadcast assistant.  We will reevaluate  him in one week p.r.n.      Harold A. Tanda Rockers, M.D.  Electronically Signed     HAN/MEDQ  D:  03/19/2007  T:  03/20/2007  Job:  [id number]

## 2010-05-28 NOTE — Discharge Summary (Signed)
NAMEJUNIOUS, [name]                  ACCOUNT NO.:  [id number]   MEDICAL RECORD NO.:  [id number]          PATIENT TYPE:  INP   LOCATION:  1403                         FACILITY:  [facility]   PHYSICIAN:  Corinna L. Lendell Caprice, MDDATE OF BIRTH:  13-Aug-1955   DATE OF ADMISSION:  08/30/2004  DATE OF DISCHARGE:  09/02/2004                                 DISCHARGE SUMMARY   DIAGNOSES:  1.  Lumbar spinal stenosis resulting in inability to walk due to pain and      left leg and left thigh pain.  2.  Newly diagnosed hypertension.  3.  Obesity.  4.  Hyperlipidemia.  5.  Tobacco abuse, counseled against.  6.  Suspected heavy alcohol use.  Recommended avoiding alcohol.   ACTIVITY:  No heavy exertion until he follows up with Dr. Channing Mutters.   FOLLOW UP:  1.  With Dr. Channing Mutters at the first available appointment.  2.  Follow up with primary care physician in two-four weeks.   CONDITION:  Stable.   CONSULTATIONS:  Interventional radiology.   PROCEDURES:  Lumbar myelogram.   DISCHARGE MEDICATIONS:  1.  Hydrochlorothiazide 25 mg p.o. daily.  2.  Prednisone taper as directed.  3.  Norvasc 10 mg p.o. daily.  4.  Clonidine 0.1 mg p.o. b.i.d.  5.  Zocor 20 mg p.o. q.h.s.   PERTINENT LABORATORIES:  UA showed trace hemoglobin, 15 ketones, negative  nitrites, negative leukocyte esterase, 100 protein with granular casts.  Basic metabolic panel was fairly unremarkable.  CBC unremarkable.  PT-INR  within normal limits.  Hemoglobin A1c 5.9.  Total cholesterol 247,  triglycerides 68, HDL 90, LDL 143.   SPECIAL STUDIES/RADIOLOGY:  An EKG showed a normal sinus rhythm.  A CT of  the abdomen and pelvis showed multilevel spinal stenosis in the lower  thoracic and upper to mid lumbar spine.  No acute abdominal process.  A  lumbar CT myelogram showed multilevel spinal stenosis most severe at L2 L3  and L3 L4, and to a lesser degree at L4 L5 and L1 L2, with associated neural  foraminal encroachment.  Schmorl's node at  L2.   HISTORY/HOSPITAL COURSE:  [name] is a pleasant 55 year old white male who  has a history of sciatica.  He apparently had some low back pain and went  to the chiropractor, who did some adjustments and the patient's pain  worsened.  He was unable to walk due to the pain.  EMS was activated and the  patient presented to the emergency room.  He is an infrequent visitor to  [facility] and carried no chronic medical diagnoses prior to this admission.  However, his blood pressure was 212/102.  He was, on exam, obese.  He did  have a positive straight leg test and was neurologically intact, other than  gait due to the pain.  The patient had the CAT scan ordered by the ER  physician and spinal stenosis was found.  The patient was unable to fit into  the MRI scanner and consequently had to have a myelogram done by  interventional radiology.  This confirmed  the presence of the spinal  stenosis. The patient was started on steroids and pain medications.  Initially Dr. Gerlene Fee was called from the emergency room and he felt that  the patient could go home and would not require emergency surgery.  However,  because he could not walk he was admitted to our service.  The patient  requested that El[facility]ner be consulted, as he knew friends and family who had  had positive experiences with him in the past.  Dr. Channing Mutters was on call and I  discussed the case with him.  He looked at the myelogram and recommended  outpatient followup, as he had no loss of bowel or bladder function and no  documented weakness.  He felt that the patient would not require emergent  surgery but may, in fact, require surgery eventually.  The patient's pain is  better and he is able to ambulate with his own cane from home.   With respect to his newly diagnosed hypertension, he was started on some  Clonidine, as there was some concern that he was a daily heavy alcohol user,  although he did not admit this initially.  He also was started on  Norvasc  and hydrochlorothiazide.  At the time of discharge his blood pressures were  about 140/80.   He was found to have hyperlipidemia and is being started on Zocor due to his  multiple cardiac risk factors.  He will need to have his liver function  tests done within three months.   The patient was asked again about his alcohol use and he did admit to  drinking more than he had initially stated.  His girlfriend reported that he  drinks heavily daily and she is concerned that he may have a problem with  alcohol.  I did recommend to the patient that he avoid alcohol and consider  rehab or Alcoholics Anonymous if he was unable to quit.  Certainly his blood  pressure could be worsened by the alcohol use.   I also recommended that he stop smoking due to his multiple risk factors.   He has no primary care physician and I recommended that he get one within a  month.  I have, however, written prescriptions for 30 days worth of his  medications.   Total time on the day  of discharge is 40 minutes.      Corinna L. Lendell Caprice, MD  Electronically Signed     CLS/MEDQ  D:  09/02/2004  T:  09/02/2004  Job:  [id number]   cc:   Payton Doughty, M.D.  [address]  [place]  [place] [address]  Fax: [phone]

## 2010-05-28 NOTE — H&P (Signed)
[name], [name]                  ACCOUNT NO.:  [id number]   MEDICAL RECORD NO.:  [id number]          PATIENT TYPE:  EMS   LOCATION:  ED                           FACILITY:  [facility]   PHYSICIAN:  Melissa L. Ladona Ridgel, MD  DATE OF BIRTH:  [birth year]   DATE OF ADMISSION:  08/30/2004  DATE OF DISCHARGE:                                HISTORY & PHYSICAL   CHIEF COMPLAINT:  Left leg/thigh pain.   PRIMARY CARE PHYSICIAN:  The patient really has none.  He has been seeing  Dr. Katrinka Blazing occasionally at the [facility].   HISTORY OF PRESENT ILLNESS:  The patient is a 55 year old white male, who  states that he developed what he thought was sciatic pain approximately 1  week ago.  He states that he went to work but rested in between, took some  Advil, and the discomfort was only intermittent.  He states yesterday the  pain was much better, but this morning he got up and developed the sensation  again that he was going to have a flare of pain in that leg.  He went  downstairs and bent over to pick up the cat bowl and developed excruciating  pain in his left hip, had difficulty straightening up.  He decided to go to  the chiropractor, who did a manipulation on his back and after that, he was  unable to really get off the bed secondary to severe pain.  The patient  presented to the emergency room with extreme 10/10 pain to the left buttock  area, radiating around the leg into the upper thigh/groin area.  It was  described as razor blade like, 10/10.  He was diaphoretic and tachycardic.  He denied any loss of bowel or bladder function and stated that nothing made  it better, and nothing made it worse, although he frequently changed  positions during the exam because of the discomfort.   REVIEW OF SYSTEMS:  The patient denied fever, chills, nausea, vomiting,  diarrhea.  He denies any hematochezia, dysuria.  All other review of systems  have been negative except for the complaint of  numbness and pain in the left  thigh.   PAST MEDICAL HISTORY:  1.  Right and left varicose vein strippings.  2.  He denies any hypertension or diabetes, but he has not seen a physician      on a regular basis.  3.  States he had an MRI approximately 1 year ago in October for his right-      sided back pain.   PAST SURGICAL HISTORY:  He has stripped both leg veins and has a left hernia  repair.   SOCIAL HISTORY:  He smokes about 1/2 pack of cigarettes a day.  She states  he drinks occasional socially; however, his wife presented outside of the  room to inform me that he drinks excessively and has never been without  drink.  She states that when he was unable to get to his drink, that he  becomes agitated and angry, but she denies any delerium tremens or  seizures.  She states that he has never had any significant parting with his alcohol.  The patient works as a Associate Professor.   FAMILY HISTORY:  Mom is living with sciatica.  Dad is living with unknown  illness.  He has a step-father who is more active in his life than his own  father.   ALLERGIES:  PENICILLIN.   MEDICATIONS:  Advil for pain relief p.r.n.   PHYSICAL EXAMINATION:  VITAL SIGNS:  Temperature is 98.5 with a blood  pressure initially of 212/102, coming down to 181/98.  Pulse is 106  initially, down to 72, respirations 20.  Saturations 97%.  GENERAL:  This is an obese white male, who appears in intermittent to severe  pain.  HEENT:  He is normocephalic, atraumatic.  Pupils equal, round, and reactive  to light and accommodation.  Extraocular muscles are intact.  Mucous  membranes are moist.  NECK:  Supple.  There is no JVD, no lymph nodes.  He does have a very short  neck.  CHEST:  Clear to auscultation with decreased breath sounds bilaterally.  CARDIOVASCULAR:  Regular rate and rhythm.  Positive S1, S2.  No S3 or S4.  No murmurs, rubs, or gallops.  ABDOMEN:  Obese, nontender, nondistended with positive bowel sounds.   There  is no rash.  The area of tenderness starts over the left iliac crest and  descends down over the upper thigh.  There is no rash suggestive of possible  shingles.  The area in question is firm and tender.  There is no ecchymosis.  He does have a positive straight leg raising test.  There is, however, more  exacerbation of the pain more in the frogleg position.  EXTREMITIES:  Hyperpigmentation of the bilateral lower extremities secondary  to stasis dermatitis.  He has +1 pulses.  TED stockings are in place.  His  feet were not completely examined secondary to his TED stockings.  NEUROLOGIC:  He has 2+ pulses in the upper extremities, 1+ in the lower  extremities.  Power is 5/5 with mild limitation in the left side secondary  to pain.  Sensation appears to be intact except in the area of the  __________ upper thigh area.  Plantars appear to be downgoing; however, this  is slightly impeded by his TED stocking which will be removed for further  examination.   LABORATORY DATA:  White count is 9.9, hemoglobin 16.1, hematocrit 47.3,  platelets 194.  Sodium is 137, potassium 3.6, chloride 101, CO2 19, BUN 15,  creatinine 0.7, glucose 126.  His urine shows moderate hemoglobin, leukocyte  esterase is negative, nitrates are negative.  CT of the abdomen and pelvis  is negative for renal stones.  His prostate is within normal limits.  He  does have scarring in the left inguinal area where he had the hernia repair.  He does show multiple levels of spinal stenosis, mainly in the lower  thoracic and upper to mid spine.   ASSESSMENT AND PLAN:  This is a 55 year old white male with a past medical  history for back problems, mainly on the right, who developed left thigh  pain, started last week but improved, and now today it is exacerbated after  bending over.  We are unable to obtain an MRI secondary to the patient's size and at this time the distribution of the disturbances L1 to L3.  1.  Left  thigh pain, unclear etiology.  Differential diagnosis is prerash      shingles, spinal  stenosis, L1, L2, L3 disk disease.  I have spoken with      neurosurgery also, and Dr. Gerlene Fee was kind enough to review the case      with me and felt that this time conservative measures with pain control      and steroids would be appropriate with possible myelogram in the a.m.      versus MRI in the outside setting if we are able to obtain this.  2.  Hypertension.  Likely, the patient has underlying disease, but it is      being compounded by his pain.  We will recheck his blood pressures and      start him on an oral medication with p.r.n. Labetalol.  3.  Hyperglycemia.  We will check a hemoglobin and A1c, start him on sliding-      scale insulin especially while he is steroids.  4.  Genitourinary.  There is no evidence for urinary tract infection at this      time.  5.  History of obesity with no other complaints.  We will start him on a      bowel regime because of narcotics and Protonix while on steroids.  6.  Alcohol abuse.  While the patient reported that he only drank social      cocktails, his wife relates that he is not      being honest about his alcohol consumption.  The patient never is      without drink.  In fact, he becomes angry when he is unable to get to      his drink.  He has no history of delerium tremens and no history of      seizures, but he has never been without drink.  Therefore, we will      provide him with p.r.n. Ativan for agitation and withdrawal.      Melissa L. Ladona Ridgel, MD  Electronically Signed     MLT/MEDQ  D:  08/30/2004  T:  08/30/2004  Job:  [id number]

## 2010-05-28 NOTE — Op Note (Signed)
[name], [name]                            ACCOUNT NO.:  [id number]   MEDICAL RECORD NO.:  [id number]                   PATIENT TYPE:  OBV   LOCATION:  0478                                 FACILITY:  [facility]   PHYSICIAN:  [name], M.D.          DATE OF BIRTH:  05-Jan-1956   DATE OF PROCEDURE:  10/02/2001  DATE OF DISCHARGE:                                 OPERATIVE REPORT   PREOPERATIVE DIAGNOSIS:  Incarcerated left inguinal hernia.   POSTOPERATIVE DIAGNOSIS:  Incarcerated left inguinal hernia.   PROCEDURE:  Repair of incarcerated left inguinal hernia with mesh.   SURGEON:  [name], M.D.   ANESTHESIA:  General.   INDICATIONS:  The patient is a 55 year old male who presents with 24 hour  history of pain in the left groin.  His primary care had ordered a CT scan  which demonstrates a good size left scrotal hernia.  Exam confirms a  nonreducible left inguinal hernia.  There is no evidence of bowel  obstruction or GI symptoms.  Repair under general anesthesia urgently had  been recommended and accepted.  The nature of the procedure, indications,  risks of bleeding, infection and recurrence were discussed and understood.  He was brought to the operating room for this procedure.   DESCRIPTION OF PROCEDURE:  The patient was brought to the operating room and  placed in the supine position on the operating table, and general  endotracheal anesthesia was induced.  He received preoperative antibiotics  and PSA.  The e groin and the scrotum was sterilely prepped and draped.  Examination showed an obese patient with a good size mass palpable in the  left groin extending down into the left scrotum.  An oblique incision was  made in the left groin and dissection carried down through the subcutaneous  tissues and Scarpa's fascia.  The external oblique was identified and  divided along the lines of its fibers to the external ring.  There was a  very large indirect  sac and hernia mass which was dissected away from  surrounding tissue.  The cord structures were seen medially.  I could not  really isolate the sac with the hernia contents in place and on an area of  the sac that had been cleared anteriorly, I opened it and the sac was found  to contain a large segment of approximately 2 feet of sigmoid colon.  This  was reduced from the scrotal portion of the sac.  It was coming through the  internal ring and could not be repacked into the abdominal cavity at this  point.  The hernia sac was divided and isolated down toward the internal  ring laterally and a portion of the internal oblique divided with the  cautery to enlarge the internal ring which then allowed the colon to be  reduced back into the abdominal cavity.  There was a portion of it  that had  a sliding component laterally, and this was dissected away from the hernia  sac to allow further reduction of the colon.  The very large sac was then  dissected further out of the scrotum and away from the cord structures and  completely freed up to the internal ring using blunt cautery dissection.  The sac was then suture ligated to the internal ring with a 2-0 Prolene  pursestring suture, and the sac divided.  The internal ring was quite  dilated.  The floor was intact.  Cord structures were further freed from the  internal ring down to the pubic tubercle and the external spermatics divided  through clamps and tied with 3-0 Vicryl to allow adequate placement of the  mesh along the floor.  The pubic tubercle, inguinal ligament and rectus  sheath were all cleared and identified.  A piece of Parietex mesh was then  trimmed to size to fit the floor of the inguinal canal with tails around the  cord at the internal ring.  It was sutured to the pubic tubercle and then to  the inguinal ligament working medial to lateral with running 0 Prolene.  Medially, the mesh was sutured to the edge of the rectus sheath  with  interrupted 2-0 Prolene.  The tails were then tacked together lateral to the  cord and creating a new internal ring snug to fingertip.  This seemed to  provide nice broad coverage to the indirect and indirect spaces.  The cord  was returned to its anatomic position.  The wound was irrigated and  hemostasis assured.  Soft tissues were infiltrated with Marcaine.  The  external oblique was closed with running 3-0 Vicryl.  Scarpa's fascia was  closed with running 3-0 Vicryl and the skin closed with running subcuticular  4-0 Monocryl and Steri-Strips.  Sponge, needle and instrument counts were  correct.  Dry sterile dressings were applied, and the patient was taken to  the recovery room in good condition.                                               [name], M.D.    [name]  D:  10/02/2001  T:  10/02/2001  Job:  [id number]

## 2010-06-08 ENCOUNTER — Other Ambulatory Visit: Payer: Self-pay | Admitting: Internal Medicine

## 2010-06-08 ENCOUNTER — Encounter (HOSPITAL_BASED_OUTPATIENT_CLINIC_OR_DEPARTMENT_OTHER): Payer: 59 | Admitting: Internal Medicine

## 2010-06-08 DIAGNOSIS — Z5111 Encounter for antineoplastic chemotherapy: Secondary | ICD-10-CM

## 2010-06-08 DIAGNOSIS — Z5112 Encounter for antineoplastic immunotherapy: Secondary | ICD-10-CM

## 2010-06-08 DIAGNOSIS — C8299 Follicular lymphoma, unspecified, extranodal and solid organ sites: Secondary | ICD-10-CM

## 2010-06-08 DIAGNOSIS — Z5189 Encounter for other specified aftercare: Secondary | ICD-10-CM

## 2010-06-08 LAB — CBC WITH DIFFERENTIAL/PLATELET
BASO%: 0.2 % (ref 0.0–2.0)
Basophils Absolute: 0 10*3/uL (ref 0.0–0.1)
EOS%: 0.5 % (ref 0.0–7.0)
MCH: 31.9 pg (ref 27.2–33.4)
MCHC: 34.5 g/dL (ref 32.0–36.0)
MCV: 92.4 fL (ref 79.3–98.0)
MONO%: 3.9 % (ref 0.0–14.0)
RBC: 3.38 10*6/uL — ABNORMAL LOW (ref 4.20–5.82)
RDW: 18.4 % — ABNORMAL HIGH (ref 11.0–14.6)

## 2010-06-08 LAB — COMPREHENSIVE METABOLIC PANEL
AST: 17 U/L (ref 0–37)
Albumin: 4 g/dL (ref 3.5–5.2)
Alkaline Phosphatase: 102 U/L (ref 39–117)
BUN: 22 mg/dL (ref 6–23)
Potassium: 4.6 mEq/L (ref 3.5–5.3)
Sodium: 138 mEq/L (ref 135–145)

## 2010-06-09 ENCOUNTER — Other Ambulatory Visit: Payer: Self-pay | Admitting: Internal Medicine

## 2010-06-09 ENCOUNTER — Encounter (HOSPITAL_BASED_OUTPATIENT_CLINIC_OR_DEPARTMENT_OTHER): Payer: 59 | Admitting: Internal Medicine

## 2010-06-09 DIAGNOSIS — Z5189 Encounter for other specified aftercare: Secondary | ICD-10-CM

## 2010-06-09 DIAGNOSIS — C8590 Non-Hodgkin lymphoma, unspecified, unspecified site: Secondary | ICD-10-CM

## 2010-06-09 DIAGNOSIS — C8299 Follicular lymphoma, unspecified, extranodal and solid organ sites: Secondary | ICD-10-CM

## 2010-06-14 ENCOUNTER — Encounter (HOSPITAL_BASED_OUTPATIENT_CLINIC_OR_DEPARTMENT_OTHER): Payer: 59 | Admitting: Internal Medicine

## 2010-06-14 ENCOUNTER — Other Ambulatory Visit: Payer: Self-pay | Admitting: Internal Medicine

## 2010-06-14 DIAGNOSIS — C8299 Follicular lymphoma, unspecified, extranodal and solid organ sites: Secondary | ICD-10-CM

## 2010-06-14 LAB — CBC WITH DIFFERENTIAL/PLATELET
BASO%: 0.6 % (ref 0.0–2.0)
Eosinophils Absolute: 0.1 10*3/uL (ref 0.0–0.5)
HCT: 29.4 % — ABNORMAL LOW (ref 38.4–49.9)
LYMPH%: 12.1 % — ABNORMAL LOW (ref 14.0–49.0)
MCHC: 34.1 g/dL (ref 32.0–36.0)
MONO#: 0.4 10*3/uL (ref 0.1–0.9)
NEUT%: 79.8 % — ABNORMAL HIGH (ref 39.0–75.0)
Platelets: 195 10*3/uL (ref 140–400)
WBC: 7.1 10*3/uL (ref 4.0–10.3)

## 2010-06-14 LAB — COMPREHENSIVE METABOLIC PANEL
BUN: 24 mg/dL — ABNORMAL HIGH (ref 6–23)
CO2: 26 mEq/L (ref 19–32)
Creatinine, Ser: 0.93 mg/dL (ref 0.50–1.35)
Glucose, Bld: 113 mg/dL — ABNORMAL HIGH (ref 70–99)
Total Bilirubin: 0.2 mg/dL — ABNORMAL LOW (ref 0.3–1.2)

## 2010-06-21 ENCOUNTER — Encounter (HOSPITAL_COMMUNITY): Payer: Self-pay

## 2010-06-21 ENCOUNTER — Encounter (HOSPITAL_BASED_OUTPATIENT_CLINIC_OR_DEPARTMENT_OTHER): Payer: 59 | Admitting: Internal Medicine

## 2010-06-21 ENCOUNTER — Other Ambulatory Visit: Payer: Self-pay | Admitting: Internal Medicine

## 2010-06-21 ENCOUNTER — Encounter (HOSPITAL_COMMUNITY)
Admission: RE | Admit: 2010-06-21 | Discharge: 2010-06-21 | Disposition: A | Discharge status: Home / Self Care | Payer: 59 | Source: Ambulatory Visit | Attending: Internal Medicine | Admitting: Internal Medicine

## 2010-06-21 DIAGNOSIS — J322 Chronic ethmoidal sinusitis: Secondary | ICD-10-CM | POA: Insufficient documentation

## 2010-06-21 DIAGNOSIS — M5137 Other intervertebral disc degeneration, lumbosacral region: Secondary | ICD-10-CM | POA: Insufficient documentation

## 2010-06-21 DIAGNOSIS — M4120 Other idiopathic scoliosis, site unspecified: Secondary | ICD-10-CM | POA: Insufficient documentation

## 2010-06-21 DIAGNOSIS — C8588 Other specified types of non-Hodgkin lymphoma, lymph nodes of multiple sites: Secondary | ICD-10-CM

## 2010-06-21 DIAGNOSIS — C8299 Follicular lymphoma, unspecified, extranodal and solid organ sites: Secondary | ICD-10-CM

## 2010-06-21 DIAGNOSIS — Z5189 Encounter for other specified aftercare: Secondary | ICD-10-CM

## 2010-06-21 DIAGNOSIS — M51379 Other intervertebral disc degeneration, lumbosacral region without mention of lumbar back pain or lower extremity pain: Secondary | ICD-10-CM | POA: Insufficient documentation

## 2010-06-21 DIAGNOSIS — R599 Enlarged lymph nodes, unspecified: Secondary | ICD-10-CM | POA: Insufficient documentation

## 2010-06-21 DIAGNOSIS — I2510 Atherosclerotic heart disease of native coronary artery without angina pectoris: Secondary | ICD-10-CM | POA: Insufficient documentation

## 2010-06-21 DIAGNOSIS — C8589 Other specified types of non-Hodgkin lymphoma, extranodal and solid organ sites: Secondary | ICD-10-CM | POA: Insufficient documentation

## 2010-06-21 DIAGNOSIS — C8590 Non-Hodgkin lymphoma, unspecified, unspecified site: Secondary | ICD-10-CM

## 2010-06-21 DIAGNOSIS — M47817 Spondylosis without myelopathy or radiculopathy, lumbosacral region: Secondary | ICD-10-CM | POA: Insufficient documentation

## 2010-06-21 HISTORY — DX: Malignant (primary) neoplasm, unspecified: C80.1

## 2010-06-21 LAB — COMPREHENSIVE METABOLIC PANEL
Albumin: 3.9 g/dL (ref 3.5–5.2)
CO2: 26 mEq/L (ref 19–32)
Glucose, Bld: 118 mg/dL — ABNORMAL HIGH (ref 70–99)
Potassium: 3.8 mEq/L (ref 3.5–5.3)
Sodium: 139 mEq/L (ref 135–145)
Total Protein: 5.8 g/dL — ABNORMAL LOW (ref 6.0–8.3)

## 2010-06-21 LAB — CBC WITH DIFFERENTIAL/PLATELET
BASO%: 1.2 % (ref 0.0–2.0)
Basophils Absolute: 0.1 10*3/uL (ref 0.0–0.1)
EOS%: 0.9 % (ref 0.0–7.0)
Eosinophils Absolute: 0.1 10*3/uL (ref 0.0–0.5)
HCT: 31.2 % — ABNORMAL LOW (ref 38.4–49.9)
HGB: 10.6 g/dL — ABNORMAL LOW (ref 13.0–17.1)
LYMPH%: 11.7 % — ABNORMAL LOW (ref 14.0–49.0)
MCH: 31.6 pg (ref 27.2–33.4)
MCHC: 34.1 g/dL (ref 32.0–36.0)
MCV: 92.8 fL (ref 79.3–98.0)
MONO#: 0.6 10*3/uL (ref 0.1–0.9)
MONO%: 9.3 % (ref 0.0–14.0)
NEUT#: 5.1 10*3/uL (ref 1.5–6.5)
NEUT%: 76.9 % — ABNORMAL HIGH (ref 39.0–75.0)
Platelets: 169 10*3/uL (ref 140–400)
RBC: 3.36 10*6/uL — ABNORMAL LOW (ref 4.20–5.82)
RDW: 17.4 % — ABNORMAL HIGH (ref 11.0–14.6)
WBC: 6.6 10*3/uL (ref 4.0–10.3)
lymph#: 0.8 10*3/uL — ABNORMAL LOW (ref 0.9–3.3)

## 2010-06-21 MED ORDER — FLUDEOXYGLUCOSE F - 18 (FDG) INJECTION
19.1000 | Freq: Once | INTRAVENOUS | Status: AC | PRN
  Administered 2010-06-21: 19.1 via INTRAVENOUS

## 2010-06-28 ENCOUNTER — Encounter (HOSPITAL_BASED_OUTPATIENT_CLINIC_OR_DEPARTMENT_OTHER): Payer: 59 | Admitting: Internal Medicine

## 2010-06-28 ENCOUNTER — Other Ambulatory Visit: Payer: Self-pay | Admitting: Internal Medicine

## 2010-06-28 DIAGNOSIS — Z5189 Encounter for other specified aftercare: Secondary | ICD-10-CM

## 2010-06-28 DIAGNOSIS — C8299 Follicular lymphoma, unspecified, extranodal and solid organ sites: Secondary | ICD-10-CM

## 2010-06-28 DIAGNOSIS — Z5111 Encounter for antineoplastic chemotherapy: Secondary | ICD-10-CM

## 2010-06-28 LAB — CBC WITH DIFFERENTIAL/PLATELET
BASO%: 1.3 % (ref 0.0–2.0)
Eosinophils Absolute: 0.1 10*3/uL (ref 0.0–0.5)
MCV: 92 fL (ref 79.3–98.0)
MONO#: 1.1 10*3/uL — ABNORMAL HIGH (ref 0.1–0.9)
MONO%: 14.9 % — ABNORMAL HIGH (ref 0.0–14.0)
NEUT#: 4.9 10*3/uL (ref 1.5–6.5)
RBC: 3.63 10*6/uL — ABNORMAL LOW (ref 4.20–5.82)
RDW: 16.3 % — ABNORMAL HIGH (ref 11.0–14.6)
WBC: 7 10*3/uL (ref 4.0–10.3)
nRBC: 0 % (ref 0–0)

## 2010-06-28 LAB — COMPREHENSIVE METABOLIC PANEL
ALT: 20 U/L (ref 0–53)
AST: 20 U/L (ref 0–37)
Alkaline Phosphatase: 106 U/L (ref 39–117)
CO2: 24 mEq/L (ref 19–32)
Sodium: 135 mEq/L (ref 135–145)
Total Bilirubin: 0.4 mg/dL (ref 0.3–1.2)
Total Protein: 6.4 g/dL (ref 6.0–8.3)

## 2010-06-28 LAB — LACTATE DEHYDROGENASE: LDH: 194 U/L (ref 94–250)

## 2010-06-29 ENCOUNTER — Encounter (HOSPITAL_BASED_OUTPATIENT_CLINIC_OR_DEPARTMENT_OTHER): Payer: 59 | Admitting: Internal Medicine

## 2010-06-29 DIAGNOSIS — Z5111 Encounter for antineoplastic chemotherapy: Secondary | ICD-10-CM

## 2010-06-29 DIAGNOSIS — C8299 Follicular lymphoma, unspecified, extranodal and solid organ sites: Secondary | ICD-10-CM

## 2010-07-05 ENCOUNTER — Encounter (HOSPITAL_BASED_OUTPATIENT_CLINIC_OR_DEPARTMENT_OTHER): Payer: 59 | Admitting: Internal Medicine

## 2010-07-05 ENCOUNTER — Other Ambulatory Visit: Payer: Self-pay | Admitting: Internal Medicine

## 2010-07-05 DIAGNOSIS — Z5111 Encounter for antineoplastic chemotherapy: Secondary | ICD-10-CM

## 2010-07-05 DIAGNOSIS — C8299 Follicular lymphoma, unspecified, extranodal and solid organ sites: Secondary | ICD-10-CM

## 2010-07-05 DIAGNOSIS — Z5189 Encounter for other specified aftercare: Secondary | ICD-10-CM

## 2010-07-05 LAB — CBC WITH DIFFERENTIAL/PLATELET
Basophils Absolute: 0.1 10*3/uL (ref 0.0–0.1)
EOS%: 0.7 % (ref 0.0–7.0)
HCT: 30.8 % — ABNORMAL LOW (ref 38.4–49.9)
HGB: 10.1 g/dL — ABNORMAL LOW (ref 13.0–17.1)
LYMPH%: 15 % (ref 14.0–49.0)
MCH: 30.2 pg (ref 27.2–33.4)
MCV: 92.2 fL (ref 79.3–98.0)
MONO%: 15.2 % — ABNORMAL HIGH (ref 0.0–14.0)
NEUT%: 68 % (ref 39.0–75.0)
Platelets: 186 10*3/uL (ref 140–400)

## 2010-07-05 LAB — COMPREHENSIVE METABOLIC PANEL
AST: 13 U/L (ref 0–37)
Alkaline Phosphatase: 121 U/L — ABNORMAL HIGH (ref 39–117)
BUN: 23 mg/dL (ref 6–23)
Creatinine, Ser: 0.91 mg/dL (ref 0.50–1.35)
Glucose, Bld: 95 mg/dL (ref 70–99)

## 2010-07-12 ENCOUNTER — Other Ambulatory Visit: Payer: Self-pay | Admitting: Internal Medicine

## 2010-07-12 ENCOUNTER — Encounter (HOSPITAL_BASED_OUTPATIENT_CLINIC_OR_DEPARTMENT_OTHER): Payer: 59 | Admitting: Internal Medicine

## 2010-07-12 DIAGNOSIS — Z5189 Encounter for other specified aftercare: Secondary | ICD-10-CM

## 2010-07-12 DIAGNOSIS — C8299 Follicular lymphoma, unspecified, extranodal and solid organ sites: Secondary | ICD-10-CM

## 2010-07-12 DIAGNOSIS — Z5111 Encounter for antineoplastic chemotherapy: Secondary | ICD-10-CM

## 2010-07-12 LAB — COMPREHENSIVE METABOLIC PANEL
AST: 20 U/L (ref 0–37)
Albumin: 4 g/dL (ref 3.5–5.2)
BUN: 20 mg/dL (ref 6–23)
Calcium: 9.3 mg/dL (ref 8.4–10.5)
Chloride: 105 mEq/L (ref 96–112)
Creatinine, Ser: 1.17 mg/dL (ref 0.50–1.35)
Glucose, Bld: 105 mg/dL — ABNORMAL HIGH (ref 70–99)
Potassium: 3.9 mEq/L (ref 3.5–5.3)

## 2010-07-12 LAB — CBC WITH DIFFERENTIAL/PLATELET
Basophils Absolute: 0.1 10*3/uL (ref 0.0–0.1)
EOS%: 0.3 % (ref 0.0–7.0)
Eosinophils Absolute: 0 10*3/uL (ref 0.0–0.5)
HCT: 31.3 % — ABNORMAL LOW (ref 38.4–49.9)
HGB: 10.7 g/dL — ABNORMAL LOW (ref 13.0–17.1)
MCH: 31.7 pg (ref 27.2–33.4)
MCV: 92.9 fL (ref 79.3–98.0)
MONO%: 13.6 % (ref 0.0–14.0)
NEUT#: 4.7 10*3/uL (ref 1.5–6.5)
NEUT%: 71.3 % (ref 39.0–75.0)
lymph#: 0.9 10*3/uL (ref 0.9–3.3)

## 2010-07-19 ENCOUNTER — Encounter (HOSPITAL_BASED_OUTPATIENT_CLINIC_OR_DEPARTMENT_OTHER): Payer: 59 | Admitting: Internal Medicine

## 2010-07-19 ENCOUNTER — Other Ambulatory Visit: Payer: Self-pay | Admitting: Internal Medicine

## 2010-07-19 DIAGNOSIS — Z5111 Encounter for antineoplastic chemotherapy: Secondary | ICD-10-CM

## 2010-07-19 DIAGNOSIS — Z5189 Encounter for other specified aftercare: Secondary | ICD-10-CM

## 2010-07-19 DIAGNOSIS — C8299 Follicular lymphoma, unspecified, extranodal and solid organ sites: Secondary | ICD-10-CM

## 2010-07-19 DIAGNOSIS — C8590 Non-Hodgkin lymphoma, unspecified, unspecified site: Secondary | ICD-10-CM

## 2010-07-19 LAB — CBC WITH DIFFERENTIAL/PLATELET
Basophils Absolute: 0 10*3/uL (ref 0.0–0.1)
EOS%: 0.4 % (ref 0.0–7.0)
Eosinophils Absolute: 0 10*3/uL (ref 0.0–0.5)
HCT: 31.7 % — ABNORMAL LOW (ref 38.4–49.9)
HGB: 11 g/dL — ABNORMAL LOW (ref 13.0–17.1)
MCH: 32 pg (ref 27.2–33.4)
MONO#: 0.9 10*3/uL (ref 0.1–0.9)
NEUT#: 5.6 10*3/uL (ref 1.5–6.5)
NEUT%: 74 % (ref 39.0–75.0)
RDW: 18 % — ABNORMAL HIGH (ref 11.0–14.6)
WBC: 7.5 10*3/uL (ref 4.0–10.3)
lymph#: 1 10*3/uL (ref 0.9–3.3)

## 2010-07-19 LAB — LACTATE DEHYDROGENASE: LDH: 185 U/L (ref 94–250)

## 2010-07-19 LAB — COMPREHENSIVE METABOLIC PANEL
AST: 19 U/L (ref 0–37)
Albumin: 4.1 g/dL (ref 3.5–5.2)
BUN: 22 mg/dL (ref 6–23)
CO2: 25 mEq/L (ref 19–32)
Calcium: 9.5 mg/dL (ref 8.4–10.5)
Chloride: 103 mEq/L (ref 96–112)
Creatinine, Ser: 0.92 mg/dL (ref 0.50–1.35)
Glucose, Bld: 112 mg/dL — ABNORMAL HIGH (ref 70–99)
Potassium: 4.3 mEq/L (ref 3.5–5.3)

## 2010-07-20 ENCOUNTER — Encounter (HOSPITAL_BASED_OUTPATIENT_CLINIC_OR_DEPARTMENT_OTHER): Payer: 59 | Admitting: Internal Medicine

## 2010-07-20 DIAGNOSIS — C8299 Follicular lymphoma, unspecified, extranodal and solid organ sites: Secondary | ICD-10-CM

## 2010-07-20 DIAGNOSIS — Z5111 Encounter for antineoplastic chemotherapy: Secondary | ICD-10-CM

## 2010-08-02 ENCOUNTER — Ambulatory Visit (HOSPITAL_COMMUNITY)
Admission: RE | Admit: 2010-08-02 | Discharge: 2010-08-02 | Disposition: A | Discharge status: Home / Self Care | Payer: 59 | Source: Ambulatory Visit | Attending: Internal Medicine | Admitting: Internal Medicine

## 2010-08-02 ENCOUNTER — Other Ambulatory Visit (HOSPITAL_COMMUNITY): Payer: 59

## 2010-08-02 DIAGNOSIS — C8589 Other specified types of non-Hodgkin lymphoma, extranodal and solid organ sites: Secondary | ICD-10-CM | POA: Insufficient documentation

## 2010-08-02 DIAGNOSIS — C8590 Non-Hodgkin lymphoma, unspecified, unspecified site: Secondary | ICD-10-CM

## 2010-08-02 DIAGNOSIS — J3489 Other specified disorders of nose and nasal sinuses: Secondary | ICD-10-CM | POA: Insufficient documentation

## 2010-08-02 MED ORDER — IOHEXOL 300 MG/ML  SOLN
125.0000 mL | Freq: Once | INTRAMUSCULAR | Status: AC | PRN
  Administered 2010-08-02: 125 mL via INTRAVENOUS

## 2010-08-09 ENCOUNTER — Other Ambulatory Visit: Payer: Self-pay | Admitting: Internal Medicine

## 2010-08-09 ENCOUNTER — Encounter (HOSPITAL_BASED_OUTPATIENT_CLINIC_OR_DEPARTMENT_OTHER): Payer: 59 | Admitting: Internal Medicine

## 2010-08-09 DIAGNOSIS — C8299 Follicular lymphoma, unspecified, extranodal and solid organ sites: Secondary | ICD-10-CM

## 2010-08-09 DIAGNOSIS — C8590 Non-Hodgkin lymphoma, unspecified, unspecified site: Secondary | ICD-10-CM

## 2010-08-09 DIAGNOSIS — Z5111 Encounter for antineoplastic chemotherapy: Secondary | ICD-10-CM

## 2010-08-09 DIAGNOSIS — Z5189 Encounter for other specified aftercare: Secondary | ICD-10-CM

## 2010-08-09 LAB — COMPREHENSIVE METABOLIC PANEL
ALT: 20 U/L (ref 0–53)
Albumin: 3.9 g/dL (ref 3.5–5.2)
CO2: 26 mEq/L (ref 19–32)
Calcium: 9.6 mg/dL (ref 8.4–10.5)
Chloride: 105 mEq/L (ref 96–112)
Creatinine, Ser: 0.84 mg/dL (ref 0.50–1.35)
Potassium: 4.3 mEq/L (ref 3.5–5.3)
Sodium: 139 mEq/L (ref 135–145)
Total Protein: 5.9 g/dL — ABNORMAL LOW (ref 6.0–8.3)

## 2010-08-09 LAB — CBC WITH DIFFERENTIAL/PLATELET
EOS%: 0.9 % (ref 0.0–7.0)
Eosinophils Absolute: 0.1 10*3/uL (ref 0.0–0.5)
HGB: 10.6 g/dL — ABNORMAL LOW (ref 13.0–17.1)
MCV: 92.2 fL (ref 79.3–98.0)
MONO%: 17.9 % — ABNORMAL HIGH (ref 0.0–14.0)
NEUT#: 4.4 10*3/uL (ref 1.5–6.5)
RBC: 3.37 10*6/uL — ABNORMAL LOW (ref 4.20–5.82)
RDW: 18.1 % — ABNORMAL HIGH (ref 11.0–14.6)
lymph#: 0.7 10*3/uL — ABNORMAL LOW (ref 0.9–3.3)

## 2010-08-09 LAB — LACTATE DEHYDROGENASE: LDH: 197 U/L (ref 94–250)

## 2010-09-20 DIAGNOSIS — Z8572 Personal history of non-Hodgkin lymphomas: Secondary | ICD-10-CM | POA: Insufficient documentation

## 2010-11-01 ENCOUNTER — Ambulatory Visit (HOSPITAL_COMMUNITY)
Admission: RE | Admit: 2010-11-01 | Discharge: 2010-11-01 | Disposition: A | Discharge status: Home / Self Care | Payer: 59 | Source: Ambulatory Visit | Attending: Internal Medicine | Admitting: Internal Medicine

## 2010-11-01 DIAGNOSIS — C8589 Other specified types of non-Hodgkin lymphoma, extranodal and solid organ sites: Secondary | ICD-10-CM | POA: Insufficient documentation

## 2010-11-01 DIAGNOSIS — C8590 Non-Hodgkin lymphoma, unspecified, unspecified site: Secondary | ICD-10-CM

## 2010-11-06 ENCOUNTER — Encounter: Payer: Self-pay | Admitting: *Deleted

## 2010-11-15 ENCOUNTER — Other Ambulatory Visit (HOSPITAL_COMMUNITY): Payer: 59

## 2010-11-17 ENCOUNTER — Ambulatory Visit: Payer: 59 | Admitting: Internal Medicine

## 2010-11-22 ENCOUNTER — Other Ambulatory Visit (HOSPITAL_COMMUNITY): Payer: 59

## 2010-12-03 ENCOUNTER — Other Ambulatory Visit: Payer: Self-pay | Admitting: *Deleted

## 2010-12-03 DIAGNOSIS — C8590 Non-Hodgkin lymphoma, unspecified, unspecified site: Secondary | ICD-10-CM

## 2010-12-04 ENCOUNTER — Telehealth: Payer: Self-pay | Admitting: Internal Medicine

## 2010-12-05 NOTE — Telephone Encounter (Signed)
First available after 12/3

## 2010-12-06 ENCOUNTER — Encounter (HOSPITAL_COMMUNITY)
Admission: RE | Admit: 2010-12-06 | Discharge: 2010-12-06 | Disposition: A | Discharge status: Home / Self Care | Payer: 59 | Source: Ambulatory Visit | Attending: Internal Medicine | Admitting: Internal Medicine

## 2010-12-06 DIAGNOSIS — I2510 Atherosclerotic heart disease of native coronary artery without angina pectoris: Secondary | ICD-10-CM | POA: Insufficient documentation

## 2010-12-06 DIAGNOSIS — C8589 Other specified types of non-Hodgkin lymphoma, extranodal and solid organ sites: Secondary | ICD-10-CM | POA: Insufficient documentation

## 2010-12-06 MED ORDER — FLUDEOXYGLUCOSE F - 18 (FDG) INJECTION
19.0000 | Freq: Once | INTRAVENOUS | Status: AC | PRN
  Administered 2010-12-06: 19 via INTRAVENOUS

## 2010-12-08 ENCOUNTER — Telehealth: Payer: Self-pay | Admitting: Internal Medicine

## 2010-12-08 NOTE — Telephone Encounter (Signed)
Called pt,left message MD visit is on 12/6 with labs

## 2010-12-09 NOTE — Telephone Encounter (Signed)
Corwin Levins is a new patient on that day may not come. Use his slot.

## 2010-12-16 ENCOUNTER — Ambulatory Visit (HOSPITAL_BASED_OUTPATIENT_CLINIC_OR_DEPARTMENT_OTHER): Payer: 59 | Admitting: Internal Medicine

## 2010-12-16 ENCOUNTER — Other Ambulatory Visit (HOSPITAL_BASED_OUTPATIENT_CLINIC_OR_DEPARTMENT_OTHER): Payer: 59 | Admitting: Lab

## 2010-12-16 VITALS — BP 140/80 | HR 65 | Temp 97.6°F | Ht 70.0 in | Wt 332.5 lb

## 2010-12-16 DIAGNOSIS — C8210 Follicular lymphoma grade II, unspecified site: Secondary | ICD-10-CM | POA: Insufficient documentation

## 2010-12-16 DIAGNOSIS — C8589 Other specified types of non-Hodgkin lymphoma, extranodal and solid organ sites: Secondary | ICD-10-CM

## 2010-12-16 DIAGNOSIS — C8299 Follicular lymphoma, unspecified, extranodal and solid organ sites: Secondary | ICD-10-CM

## 2010-12-16 DIAGNOSIS — C8590 Non-Hodgkin lymphoma, unspecified, unspecified site: Secondary | ICD-10-CM

## 2010-12-16 LAB — CBC WITH DIFFERENTIAL/PLATELET
BASO%: 1 % (ref 0.0–2.0)
EOS%: 4.4 % (ref 0.0–7.0)
HCT: 38.6 % (ref 38.4–49.9)
LYMPH%: 13.8 % — ABNORMAL LOW (ref 14.0–49.0)
MCH: 29.9 pg (ref 27.2–33.4)
MCHC: 34.1 g/dL (ref 32.0–36.0)
MONO%: 9.4 % (ref 0.0–14.0)
NEUT%: 71.4 % (ref 39.0–75.0)
Platelets: 228 10*3/uL (ref 140–400)
lymph#: 1 10*3/uL (ref 0.9–3.3)

## 2010-12-16 LAB — COMPREHENSIVE METABOLIC PANEL
ALT: 24 U/L (ref 0–53)
AST: 21 U/L (ref 0–37)
Alkaline Phosphatase: 125 U/L — ABNORMAL HIGH (ref 39–117)
Creatinine, Ser: 0.97 mg/dL (ref 0.50–1.35)
Total Bilirubin: 0.4 mg/dL (ref 0.3–1.2)

## 2010-12-16 LAB — LACTATE DEHYDROGENASE: LDH: 151 U/L (ref 94–250)

## 2010-12-16 NOTE — Progress Notes (Signed)
[facility] OFFICE PROGRESS NOTE  [name] Baars, MD, MD [address] [facility], [place]. [place] [address]  DIAGNOSIS: :  Stage IIIA (T2a N2 M0) non-small cell lung cancer consistent with squamous cell carcinoma diagnosed in June 2012.  PRIOR THERAPY: Status post concurrent chemoradiation with weekly carboplatin and paclitaxel.  Last dose of chemotherapy was given on August 30, 2010.  CURRENT THERAPY: Observation.  INTERVAL HISTORY: [name] 55 y.o. male returns to the clinic today for 4 month followup visit. The patient is feeling fine today he denied having any significant complaints. He lost around 25 pounds since his last visit mainly because he is currently off her steroids. He denied having any significant chest pain or shortness breath, no nausea or vomiting, no palpable lymphadenopathy. He has repeat PET scan performed on 12/06/2010 and the patient is here today for evaluation and discussion of his scan results.  MEDICAL HISTORY: Past Medical History  Diagnosis Date  . Cancer   . Hypertension   . Venous insufficiency   . Dyslipidemia     ALLERGIES:  is allergic to penicillins.  MEDICATIONS:  Current Outpatient Prescriptions  Medication Sig Dispense Refill  . allopurinol (ZYLOPRIM) 100 MG tablet Take 100 mg by mouth daily.        [name] Kitchen amLODipine (NORVASC) 5 MG tablet Take 5 mg by mouth daily.        . Cholecalciferol (VITAMIN D-3 PO) Take 1 tablet by mouth daily.        . Ibuprofen (ADVIL) 200 MG CAPS Take by mouth as needed.        . lidocaine-prilocaine (EMLA) cream Apply topically as needed.        . Nebivolol HCl (BYSTOLIC PO) Take 0.5 tablets by mouth daily.        . Omega-3 Fatty Acids (FISH OIL PO) Take 1 tablet by mouth daily.        . prochlorperazine (COMPAZINE) 10 MG tablet Take 10 mg by mouth every 6 (six) hours as needed.        [name] Kitchen HYDROcodone-acetaminophen (VICODIN) 5-500 MG per tablet Take 1 tablet by mouth every 6 (six)  hours as needed.          REVIEW OF SYSTEMS:  A comprehensive review of systems was negative.   PHYSICAL EXAMINATION: General appearance: alert, cooperative and no distress Head: Normocephalic, without obvious abnormality, atraumatic Neck: no adenopathy Lymph nodes: Cervical, supraclavicular, and axillary nodes normal. Resp: clear to auscultation bilaterally Cardio: regular rate and rhythm, S1, S2 normal, no murmur, click, rub or gallop GI: soft, non-tender; bowel sounds normal; no masses,  no organomegaly Extremities: extremities normal, atraumatic, no cyanosis or edema Neurologic: Alert and oriented X 3, normal strength and tone. Normal symmetric reflexes. Normal coordination and gait  ECOG PERFORMANCE STATUS: 0 - Asymptomatic  Blood pressure 140/80, pulse 65, temperature 97.6 F (36.4 C), temperature source Oral, height 5\' 10"  (1.778 m), weight 332 lb 8 oz (150.821 kg).  LABORATORY DATA: Lab Results  Component Value Date   WBC 7.6 12/16/2010   HGB 13.1 12/16/2010   HCT 38.6 12/16/2010   MCV 87.7 12/16/2010   PLT 228 12/16/2010      Chemistry      Component Value Date/Time   NA 139 08/09/2010 0927   NA 139 08/09/2010 0927   K 4.3 08/09/2010 0927   K 4.3 08/09/2010 0927   CL 105 08/09/2010 0927   CL 105 08/09/2010 0927   CO2 26 08/09/2010  0927   CO2 26 08/09/2010 0927   BUN 19 08/09/2010 0927   BUN 19 08/09/2010 0927   CREATININE 0.84 08/09/2010 0927   CREATININE 0.84 08/09/2010 0927      Component Value Date/Time   CALCIUM 9.6 08/09/2010 0927   CALCIUM 9.6 08/09/2010 0927   ALKPHOS 99 08/09/2010 0927   ALKPHOS 99 08/09/2010 0927   AST 19 08/09/2010 0927   AST 19 08/09/2010 0927   ALT 20 08/09/2010 0927   ALT 20 08/09/2010 0927   BILITOT 0.3 08/09/2010 0927   BILITOT 0.3 08/09/2010 0927       RADIOGRAPHIC STUDIES: Nm Pet Image Restag (ps) Skull Base To Thigh  12/06/2010  *RADIOLOGY REPORT*  Clinical Data: Subsequent treatment strategy for non-Hodgkins lymphoma.  NUCLEAR MEDICINE  PET CT RESTAGING (PS) SKULL BASE TO THIGH  Technique:  90.00 mCi F-18 FDG was injected intravenously via the right antecubital fossa.  Full-ring PET imaging was performed from the skull base through the mid-thighs 56  minutes after injection. CT data was obtained and used for attenuation correction and anatomic localization only.  (This was not acquired as a diagnostic CT examination.)  Fasting Blood Glucose:  107  Patient Weight:  340 pounds.  Comparison: CT neck, chest, abdomen pelvis 08/02/2010 and PET CT 06/21/2010.  Findings: No abnormal hypermetabolism in the neck, chest, abdomen or pelvis.  There is fairly diffuse muscular activity, as before.  CT images were performed for attenuation correction.  There is complete opacification of the maxillary sinuses, with scattered opacification of the ethmoid air cells.  Coronary artery calcification.  Bladder wall appears thickened. External iliac lymph nodes measure up to 1.4 cm on the right, as before.  IMPRESSION:  1.  No evidence of active lymphoma. 2.  Coronary artery calcification.  Original Report Authenticated By: [name], M.D.    ASSESSMENT: This is a very pleasant 55 years old white male with history of stage III follicular lymphoma, grade 2, status post 8 cycles of systemic chemotherapy with CHOP/Rituxan. The patient is doing fine and he has no evidence for disease recurrence. I discussed the PET scan results was that [name].    PLAN: I gave him the option of observation versus maintenance treatment with Rituxan for a total of 2 years on every 2 months basis. After discussion of the 2 options, [name] decided to proceed with the treatment with maintenance rituximab but he would like to delay the start of the treatment until after the new year. This first cycle expected to be on 01/17/2011. He would come back for followup visit in 3 months with the start of cycle #2.   All questions were answered. The patient knows to call the clinic with  any problems, questions or concerns. We can certainly see the patient much sooner if necessary.

## 2010-12-17 ENCOUNTER — Telehealth: Payer: Self-pay | Admitting: Internal Medicine

## 2010-12-17 NOTE — Telephone Encounter (Signed)
lmonvm adviisng the pt of his feb 2013 appts

## 2010-12-31 ENCOUNTER — Telehealth: Payer: Self-pay | Admitting: Internal Medicine

## 2010-12-31 NOTE — Telephone Encounter (Signed)
Called pt left message to call us back to schedule chemo

## 2010-12-31 NOTE — Telephone Encounter (Signed)
Calling to see if maintenance chemo appt is scheduled. I told him not yet and I will send message to scheduler

## 2011-01-03 ENCOUNTER — Telehealth: Payer: Self-pay | Admitting: Internal Medicine

## 2011-01-03 NOTE — Telephone Encounter (Signed)
lmonvm adviising the pt of his feb 2013 appt calendar

## 2011-01-05 ENCOUNTER — Telehealth: Payer: Self-pay | Admitting: Internal Medicine

## 2011-01-05 NOTE — Telephone Encounter (Signed)
Called pt,left message for treatment on 01/17/11

## 2011-01-17 ENCOUNTER — Other Ambulatory Visit (HOSPITAL_BASED_OUTPATIENT_CLINIC_OR_DEPARTMENT_OTHER): Payer: 59

## 2011-01-17 ENCOUNTER — Ambulatory Visit (HOSPITAL_BASED_OUTPATIENT_CLINIC_OR_DEPARTMENT_OTHER): Payer: 59

## 2011-01-17 ENCOUNTER — Telehealth: Payer: Self-pay | Admitting: Internal Medicine

## 2011-01-17 ENCOUNTER — Other Ambulatory Visit: Payer: Self-pay | Admitting: Internal Medicine

## 2011-01-17 VITALS — BP 98/63 | HR 63 | Temp 97.1°F

## 2011-01-17 DIAGNOSIS — C8210 Follicular lymphoma grade II, unspecified site: Secondary | ICD-10-CM

## 2011-01-17 DIAGNOSIS — Z5112 Encounter for antineoplastic immunotherapy: Secondary | ICD-10-CM

## 2011-01-17 DIAGNOSIS — C8299 Follicular lymphoma, unspecified, extranodal and solid organ sites: Secondary | ICD-10-CM

## 2011-01-17 LAB — CBC WITH DIFFERENTIAL/PLATELET
BASO%: 0.1 % (ref 0.0–2.0)
Basophils Absolute: 0 10*3/uL (ref 0.0–0.1)
EOS%: 4 % (ref 0.0–7.0)
HCT: 37.8 % — ABNORMAL LOW (ref 38.4–49.9)
HGB: 13.1 g/dL (ref 13.0–17.1)
MCH: 30.6 pg (ref 27.2–33.4)
MCHC: 34.7 g/dL (ref 32.0–36.0)
MCV: 88 fL (ref 79.3–98.0)
MONO%: 8.2 % (ref 0.0–14.0)
NEUT%: 73.2 % (ref 39.0–75.0)

## 2011-01-17 LAB — COMPREHENSIVE METABOLIC PANEL
ALT: 23 U/L (ref 0–53)
AST: 20 U/L (ref 0–37)
Alkaline Phosphatase: 122 U/L — ABNORMAL HIGH (ref 39–117)
BUN: 23 mg/dL (ref 6–23)
Creatinine, Ser: 0.96 mg/dL (ref 0.50–1.35)
Total Bilirubin: 0.5 mg/dL (ref 0.3–1.2)

## 2011-01-17 MED ORDER — SODIUM CHLORIDE 0.9 % IV SOLN
Freq: Once | INTRAVENOUS | Status: AC
  Administered 2011-01-17: 09:00:00 via INTRAVENOUS

## 2011-01-17 MED ORDER — DIPHENHYDRAMINE HCL 25 MG PO CAPS
50.0000 mg | ORAL_CAPSULE | Freq: Once | ORAL | Status: AC
  Administered 2011-01-17: 50 mg via ORAL

## 2011-01-17 MED ORDER — HEPARIN SOD (PORK) LOCK FLUSH 100 UNIT/ML IV SOLN
500.0000 [IU] | Freq: Once | INTRAVENOUS | Status: AC | PRN
  Administered 2011-01-17: 500 [IU]
  Filled 2011-01-17: qty 5

## 2011-01-17 MED ORDER — SODIUM CHLORIDE 0.9 % IJ SOLN
10.0000 mL | INTRAMUSCULAR | Status: DC | PRN
  Administered 2011-01-17: 10 mL
  Filled 2011-01-17: qty 10

## 2011-01-17 MED ORDER — SODIUM CHLORIDE 0.9 % IV SOLN
375.0000 mg/m2 | Freq: Once | INTRAVENOUS | Status: AC
  Administered 2011-01-17: 1000 mg via INTRAVENOUS
  Filled 2011-01-17: qty 100

## 2011-01-17 MED ORDER — ACETAMINOPHEN 325 MG PO TABS
650.0000 mg | ORAL_TABLET | Freq: Once | ORAL | Status: AC
  Administered 2011-01-17: 650 mg via ORAL

## 2011-01-17 NOTE — Progress Notes (Signed)
Patient requested to have port flushes every 6-8 weeks;forwarded information to desk nurse.

## 2011-01-17 NOTE — Progress Notes (Signed)
Please discard the diagnosis and prior therapy on the note above.  The correct one as below.  PRINCIPAL DIAGNOSIS:  Stage III follicular lymphoma, grade 2, diagnosed in June 2012.  PRIOR THERAPY:  Systemic chemotherapy with CHOP/Rituxan, status post 8 cycles, last dose was given 07/19/2010.  CURRENT THERAPY: Observation.

## 2011-01-17 NOTE — Patient Instructions (Signed)
Patient aware of next appointment; discharged home with no complaints of pain; patient to call if he has any incidents of nausea for Rx.

## 2011-01-17 NOTE — Telephone Encounter (Signed)
l/m and mailed appts for 2/18,4/1,5/13,6/24,8/5,9/16,10/28 and12/9   aom

## 2011-01-18 ENCOUNTER — Telehealth: Payer: Self-pay | Admitting: *Deleted

## 2011-01-18 NOTE — Telephone Encounter (Signed)
Message copied by Augusto Garbe on Tue Jan 18, 2011  1:41 PM ------      Message from: Kallie Locks      Created: Mon Jan 17, 2011  3:15 PM      Regarding: 'chemo follow-up call'       Patient received 1st time Rituxan, per Dr. Arbutus Ped; tolerated well.

## 2011-01-18 NOTE — Telephone Encounter (Signed)
Called [name] who is doing well.  Received last CHOP/Rituxan on 07-19-2010 and was restarted on rituxan yesterday.  Asked if he has to receive the tylenol and benadryl.  Explained the reason for the anti-histamine and anti-pyretic and he understands the need for these two pre-meds.  Report he slept most of the day from the benadryl.  No further questions.

## 2011-02-14 ENCOUNTER — Other Ambulatory Visit: Payer: 59 | Admitting: Lab

## 2011-02-14 ENCOUNTER — Ambulatory Visit: Payer: 59

## 2011-02-14 ENCOUNTER — Ambulatory Visit: Payer: 59 | Admitting: Physician Assistant

## 2011-03-11 ENCOUNTER — Other Ambulatory Visit: Payer: Self-pay | Admitting: Physician Assistant

## 2011-03-11 DIAGNOSIS — C8210 Follicular lymphoma grade II, unspecified site: Secondary | ICD-10-CM

## 2011-03-13 ENCOUNTER — Other Ambulatory Visit: Payer: Self-pay | Admitting: Internal Medicine

## 2011-03-14 ENCOUNTER — Encounter: Payer: Self-pay | Admitting: Physician Assistant

## 2011-03-14 ENCOUNTER — Other Ambulatory Visit: Payer: 59

## 2011-03-14 ENCOUNTER — Ambulatory Visit: Payer: 59 | Admitting: Physician Assistant

## 2011-03-14 ENCOUNTER — Other Ambulatory Visit: Payer: Self-pay | Admitting: *Deleted

## 2011-03-14 ENCOUNTER — Ambulatory Visit (HOSPITAL_BASED_OUTPATIENT_CLINIC_OR_DEPARTMENT_OTHER): Payer: 59

## 2011-03-14 VITALS — BP 121/76 | HR 69 | Temp 98.2°F

## 2011-03-14 VITALS — BP 173/82 | HR 63 | Temp 96.8°F | Ht 70.0 in | Wt 341.7 lb

## 2011-03-14 DIAGNOSIS — C8210 Follicular lymphoma grade II, unspecified site: Secondary | ICD-10-CM

## 2011-03-14 DIAGNOSIS — Z5112 Encounter for antineoplastic immunotherapy: Secondary | ICD-10-CM

## 2011-03-14 DIAGNOSIS — C8299 Follicular lymphoma, unspecified, extranodal and solid organ sites: Secondary | ICD-10-CM

## 2011-03-14 LAB — COMPREHENSIVE METABOLIC PANEL
Albumin: 4 g/dL (ref 3.5–5.2)
CO2: 22 mEq/L (ref 19–32)
Glucose, Bld: 82 mg/dL (ref 70–99)
Sodium: 139 mEq/L (ref 135–145)
Total Bilirubin: 0.3 mg/dL (ref 0.3–1.2)
Total Protein: 6.3 g/dL (ref 6.0–8.3)

## 2011-03-14 LAB — CBC WITH DIFFERENTIAL/PLATELET
Basophils Absolute: 0.1 10*3/uL (ref 0.0–0.1)
Eosinophils Absolute: 0.3 10*3/uL (ref 0.0–0.5)
HCT: 36.9 % — ABNORMAL LOW (ref 38.4–49.9)
HGB: 12.5 g/dL — ABNORMAL LOW (ref 13.0–17.1)
MCV: 88.7 fL (ref 79.3–98.0)
MONO%: 10.1 % (ref 0.0–14.0)
NEUT#: 6 10*3/uL (ref 1.5–6.5)
NEUT%: 71.3 % (ref 39.0–75.0)
Platelets: 206 10*3/uL (ref 140–400)
RDW: 15.7 % — ABNORMAL HIGH (ref 11.0–14.6)

## 2011-03-14 MED ORDER — SODIUM CHLORIDE 0.9 % IV SOLN
Freq: Once | INTRAVENOUS | Status: AC
  Administered 2011-03-14: 11:00:00 via INTRAVENOUS

## 2011-03-14 MED ORDER — ACETAMINOPHEN 325 MG PO TABS
650.0000 mg | ORAL_TABLET | Freq: Once | ORAL | Status: AC
  Administered 2011-03-14: 650 mg via ORAL

## 2011-03-14 MED ORDER — LIDOCAINE-PRILOCAINE 2.5-2.5 % EX CREA: TOPICAL_CREAM | CUTANEOUS | Status: DC | PRN

## 2011-03-14 MED ORDER — DIPHENHYDRAMINE HCL 25 MG PO CAPS
50.0000 mg | ORAL_CAPSULE | Freq: Once | ORAL | Status: AC
  Administered 2011-03-14: 50 mg via ORAL

## 2011-03-14 MED ORDER — SODIUM CHLORIDE 0.9 % IJ SOLN
10.0000 mL | INTRAMUSCULAR | Status: DC | PRN
  Administered 2011-03-14: 10 mL
  Filled 2011-03-14: qty 10

## 2011-03-14 MED ORDER — SODIUM CHLORIDE 0.9 % IV SOLN
375.0000 mg/m2 | Freq: Once | INTRAVENOUS | Status: AC
  Administered 2011-03-14: 1000 mg via INTRAVENOUS
  Filled 2011-03-14: qty 100

## 2011-03-14 MED ORDER — HEPARIN SOD (PORK) LOCK FLUSH 100 UNIT/ML IV SOLN
500.0000 [IU] | Freq: Once | INTRAVENOUS | Status: AC | PRN
  Administered 2011-03-14: 500 [IU]
  Filled 2011-03-14: qty 5

## 2011-03-14 NOTE — Progress Notes (Signed)
[facility] OFFICE PROGRESS NOTE  [name], MD, MD [address] [facility], [place]. [place] [address]  DIAGNOSIS: :  Stage III follicular lymphoma, grade 2, diagnosed in June 2012  PRIOR THERAPY: Status post concurrent chemoradiation with weekly carboplatin and paclitaxel.  Last dose of chemotherapy was given on August 30, 2010.  CURRENT THERAPY: Maintenance therapy with Rituxan 375 mg/m2 given every 2 months for a total of 2 years, status post 1 cycle  INTERVAL HISTORY: [name] 56 y.o. male returns to the clinic today for 2 month followup visit. He tolerated his first cycle of maintenance Rituxan without difficulty. He reports occasional night sweats but voiced no other complaints. He requests a refill for his Emla cream.  MEDICAL HISTORY: Past Medical History  Diagnosis Date  . Cancer   . Hypertension   . Venous insufficiency   . Dyslipidemia     ALLERGIES:  is allergic to penicillins.  MEDICATIONS:  Current Outpatient Prescriptions  Medication Sig Dispense Refill  . amLODipine (NORVASC) 5 MG tablet Take 5 mg by mouth daily.        [name] Kitchen BENICAR HCT 40-25 MG per tablet       . Cholecalciferol (VITAMIN D-3 PO) Take 1 tablet by mouth daily.        . Ibuprofen (ADVIL) 200 MG CAPS Take by mouth as needed.        . lidocaine-prilocaine (EMLA) cream Apply topically as needed.  30 g  1  . Nebivolol HCl (BYSTOLIC PO) Take 0.5 tablets by mouth daily.        . Omega-3 Fatty Acids (FISH OIL PO) Take 1 tablet by mouth daily.        [name] Kitchen allopurinol (ZYLOPRIM) 100 MG tablet Take 100 mg by mouth daily.        [name] Kitchen HYDROcodone-acetaminophen (VICODIN) 5-500 MG per tablet Take 1 tablet by mouth every 6 (six) hours as needed.        . prochlorperazine (COMPAZINE) 10 MG tablet Take 10 mg by mouth every 6 (six) hours as needed.         No current facility-administered medications for this visit.   Facility-Administered Medications Ordered in Other Visits    Medication Dose Route Frequency Provider Last Rate Last Dose  . 0.9 %  sodium chloride infusion   Intravenous Once Mohamed K. Mohamed, MD      . acetaminophen (TYLENOL) tablet 650 mg  650 mg Oral Once Mohamed K. Mohamed, MD      . diphenhydrAMINE (BENADRYL) capsule 50 mg  50 mg Oral Once Mohamed K. Mohamed, MD      . heparin lock flush 100 unit/mL  500 Units Intracatheter Once PRN Mohamed K. Mohamed, MD      . riTUXimab (RITUXAN) 1,000 mg in sodium chloride 0.9 % 250 mL chemo infusion  375 mg/m2 (Treatment Plan Actual) Intravenous Once Mohamed K. Mohamed, MD      . sodium chloride 0.9 % injection 10 mL  10 mL Intracatheter PRN Mohamed K. Arbutus Ped, MD        REVIEW OF SYSTEMS:  A comprehensive review of systems was negative except for: Constitutional: positive for night sweats   PHYSICAL EXAMINATION: General appearance: alert, cooperative and no distress Head: Normocephalic, without obvious abnormality, atraumatic Neck: no adenopathy Lymph nodes: Cervical, supraclavicular, and axillary nodes normal. Resp: clear to auscultation bilaterally Cardio: regular rate and rhythm, S1, S2 normal, no murmur, click, rub or gallop GI: soft, non-tender; bowel sounds normal;  no masses,  no organomegaly Extremities: extremities normal, atraumatic, no cyanosis or edema Neurologic: Alert and oriented X 3, normal strength and tone. Normal symmetric reflexes. Normal coordination and gait  ECOG PERFORMANCE STATUS: 0 - Asymptomatic  Blood pressure 173/82, pulse 63, temperature 96.8 F (36 C), temperature source Oral, height 5\' 10"  (1.778 m), weight 341 lb 11.2 oz (154.994 kg).  LABORATORY DATA: Lab Results  Component Value Date   WBC 8.4 03/14/2011   HGB 12.5* 03/14/2011   HCT 36.9* 03/14/2011   MCV 88.7 03/14/2011   PLT 206 03/14/2011      Chemistry      Component Value Date/Time   NA 138 01/17/2011 0835   K 4.0 01/17/2011 0835   CL 103 01/17/2011 0835   CO2 23 01/17/2011 0835   BUN 23 01/17/2011 0835   CREATININE  0.96 01/17/2011 0835      Component Value Date/Time   CALCIUM 9.1 01/17/2011 0835   ALKPHOS 122* 01/17/2011 0835   AST 20 01/17/2011 0835   ALT 23 01/17/2011 0835   BILITOT 0.5 01/17/2011 0835       RADIOGRAPHIC STUDIES: Nm Pet Image Restag (ps) Skull Base To Thigh  12/06/2010  *RADIOLOGY REPORT*  Clinical Data: Subsequent treatment strategy for non-Hodgkins lymphoma.  NUCLEAR MEDICINE PET CT RESTAGING (PS) SKULL BASE TO THIGH  Technique:  90.00 mCi F-18 FDG was injected intravenously via the right antecubital fossa.  Full-ring PET imaging was performed from the skull base through the mid-thighs 56  minutes after injection. CT data was obtained and used for attenuation correction and anatomic localization only.  (This was not acquired as a diagnostic CT examination.)  Fasting Blood Glucose:  107  Patient Weight:  340 pounds.  Comparison: CT neck, chest, abdomen pelvis 08/02/2010 and PET CT 06/21/2010.  Findings: No abnormal hypermetabolism in the neck, chest, abdomen or pelvis.  There is fairly diffuse muscular activity, as before.  CT images were performed for attenuation correction.  There is complete opacification of the maxillary sinuses, with scattered opacification of the ethmoid air cells.  Coronary artery calcification.  Bladder wall appears thickened. External iliac lymph nodes measure up to 1.4 cm on the right, as before.  IMPRESSION:  1.  No evidence of active lymphoma. 2.  Coronary artery calcification.  Original Report Authenticated By: Reyes Ivan, M.D.    ASSESSMENT/PLAN: This is a very pleasant 56 years old white male with history of stage III follicular lymphoma, grade 2, status post 8 cycles of systemic chemotherapy with CHOP/Rituxan. The patient is doing fine and he has no evidence for disease recurrence.  He is currently receiving maintenance chemotherapy with Rituxan 375 mg/m2 every 2 months, status post 1 cycle. The patient was discussed with Dr. Arbutus Ped. He will proceed with his  second cycle of matenance Rituxan and return in 2 months with a repeat CBC differential, CMET, and LDH prior to his next scheduled cycle. We will plan to do restaging imagine studies after every 6 months of therapy.A prescription for Emla cream was sent to his pharmacy of record via [facility].  Laural Benes, Mkayla Steele E, PA-C   All questions were answered. The patient knows to call the clinic with any problems, questions or concerns. We can certainly see the patient much sooner if necessary.

## 2011-03-14 NOTE — Patient Instructions (Signed)
Pt will call with any problems 

## 2011-05-16 ENCOUNTER — Telehealth: Payer: Self-pay | Admitting: *Deleted

## 2011-05-16 ENCOUNTER — Ambulatory Visit (HOSPITAL_BASED_OUTPATIENT_CLINIC_OR_DEPARTMENT_OTHER): Payer: 59

## 2011-05-16 ENCOUNTER — Telehealth: Payer: Self-pay | Admitting: Internal Medicine

## 2011-05-16 ENCOUNTER — Encounter: Payer: Self-pay | Admitting: Physician Assistant

## 2011-05-16 ENCOUNTER — Ambulatory Visit (HOSPITAL_BASED_OUTPATIENT_CLINIC_OR_DEPARTMENT_OTHER): Payer: 59 | Admitting: Physician Assistant

## 2011-05-16 ENCOUNTER — Other Ambulatory Visit (HOSPITAL_BASED_OUTPATIENT_CLINIC_OR_DEPARTMENT_OTHER): Payer: 59 | Admitting: Lab

## 2011-05-16 VITALS — BP 126/72 | HR 58 | Temp 96.9°F | Ht 70.0 in | Wt 340.0 lb

## 2011-05-16 VITALS — BP 100/56 | HR 60 | Temp 97.9°F

## 2011-05-16 DIAGNOSIS — C8299 Follicular lymphoma, unspecified, extranodal and solid organ sites: Secondary | ICD-10-CM

## 2011-05-16 DIAGNOSIS — C8210 Follicular lymphoma grade II, unspecified site: Secondary | ICD-10-CM

## 2011-05-16 DIAGNOSIS — Z5112 Encounter for antineoplastic immunotherapy: Secondary | ICD-10-CM

## 2011-05-16 LAB — COMPREHENSIVE METABOLIC PANEL
ALT: 28 U/L (ref 0–53)
Albumin: 4.2 g/dL (ref 3.5–5.2)
CO2: 22 mEq/L (ref 19–32)
Calcium: 9.2 mg/dL (ref 8.4–10.5)
Chloride: 102 mEq/L (ref 96–112)
Glucose, Bld: 104 mg/dL — ABNORMAL HIGH (ref 70–99)
Potassium: 4.3 mEq/L (ref 3.5–5.3)
Sodium: 138 mEq/L (ref 135–145)
Total Bilirubin: 0.4 mg/dL (ref 0.3–1.2)
Total Protein: 6.3 g/dL (ref 6.0–8.3)

## 2011-05-16 LAB — CBC WITH DIFFERENTIAL/PLATELET
Basophils Absolute: 0.1 10*3/uL (ref 0.0–0.1)
Eosinophils Absolute: 0.4 10*3/uL (ref 0.0–0.5)
HGB: 13.7 g/dL (ref 13.0–17.1)
MCV: 89.5 fL (ref 79.3–98.0)
MONO#: 1 10*3/uL — ABNORMAL HIGH (ref 0.1–0.9)
MONO%: 13.5 % (ref 0.0–14.0)
NEUT#: 5 10*3/uL (ref 1.5–6.5)
RDW: 14.9 % — ABNORMAL HIGH (ref 11.0–14.6)
WBC: 7.6 10*3/uL (ref 4.0–10.3)
nRBC: 0 % (ref 0–0)

## 2011-05-16 LAB — LACTATE DEHYDROGENASE: LDH: 197 U/L (ref 94–250)

## 2011-05-16 MED ORDER — DIPHENHYDRAMINE HCL 25 MG PO CAPS
50.0000 mg | ORAL_CAPSULE | Freq: Once | ORAL | Status: AC
  Administered 2011-05-16: 50 mg via ORAL

## 2011-05-16 MED ORDER — SODIUM CHLORIDE 0.9 % IJ SOLN
10.0000 mL | INTRAMUSCULAR | Status: DC | PRN
  Administered 2011-05-16: 10 mL
  Filled 2011-05-16: qty 10

## 2011-05-16 MED ORDER — ACETAMINOPHEN 325 MG PO TABS
650.0000 mg | ORAL_TABLET | Freq: Once | ORAL | Status: AC
  Administered 2011-05-16: 650 mg via ORAL

## 2011-05-16 MED ORDER — SODIUM CHLORIDE 0.9 % IV SOLN
375.0000 mg/m2 | Freq: Once | INTRAVENOUS | Status: AC
  Administered 2011-05-16: 1000 mg via INTRAVENOUS
  Filled 2011-05-16: qty 100

## 2011-05-16 MED ORDER — HEPARIN SOD (PORK) LOCK FLUSH 100 UNIT/ML IV SOLN
500.0000 [IU] | Freq: Once | INTRAVENOUS | Status: AC | PRN
  Administered 2011-05-16: 500 [IU]
  Filled 2011-05-16: qty 5

## 2011-05-16 MED ORDER — SODIUM CHLORIDE 0.9 % IV SOLN
Freq: Once | INTRAVENOUS | Status: AC
  Administered 2011-05-16: 11:00:00 via INTRAVENOUS

## 2011-05-16 NOTE — Telephone Encounter (Signed)
Gave pt appt calendar for July lab , CT and MD, gave pt oral contrast NPO 4 hrs prior to CT

## 2011-05-16 NOTE — Telephone Encounter (Signed)
Per staff message from [place], I  Have scheduled treatment appt to follow MD visit on 7/8. Rose aware appt in computer. JWM

## 2011-05-16 NOTE — Progress Notes (Signed)
[name] OFFICE PROGRESS NOTE  [name], MD, MD [address] [facility], [place]. [place] [address]  DIAGNOSIS: :  Stage III follicular lymphoma, grade 2, diagnosed in June 2012  PRIOR THERAPY: Status post concurrent chemoradiation with weekly carboplatin and paclitaxel.  Last dose of chemotherapy was given on August 30, 2010.  CURRENT THERAPY: Maintenance therapy with Rituxan 375 mg/m2 given every 2 months for a total of 2 years, status post 2 cycles  INTERVAL HISTORY: [name] 56 y.o. male returns to the clinic today for 2 month followup visit. He is accompanied by his sister. He is tolerating his maintenance Rituxan without difficulty. He voices no other complaints today.   MEDICAL HISTORY: Past Medical History  Diagnosis Date  . Cancer   . Hypertension   . Venous insufficiency   . Dyslipidemia     ALLERGIES:  is allergic to penicillins.  MEDICATIONS:  Current Outpatient Prescriptions  Medication Sig Dispense Refill  . allopurinol (ZYLOPRIM) 100 MG tablet Take 100 mg by mouth daily.        [name] Kitchen amLODipine (NORVASC) 5 MG tablet Take 5 mg by mouth daily.        [name] Kitchen BENICAR HCT 40-25 MG per tablet       . Cholecalciferol (VITAMIN D-3 PO) Take 1 tablet by mouth daily.        [name] Kitchen HYDROcodone-acetaminophen (VICODIN) 5-500 MG per tablet Take 1 tablet by mouth every 6 (six) hours as needed.        . Ibuprofen (ADVIL) 200 MG CAPS Take by mouth as needed.        . lidocaine-prilocaine (EMLA) cream Apply topically as needed.  30 g  1  . Nebivolol HCl (BYSTOLIC PO) Take 0.5 tablets by mouth daily.        . Omega-3 Fatty Acids (FISH OIL PO) Take 1 tablet by mouth daily.        . prochlorperazine (COMPAZINE) 10 MG tablet Take 10 mg by mouth every 6 (six) hours as needed.         No current facility-administered medications for this visit.   Facility-Administered Medications Ordered in Other Visits  Medication Dose Route Frequency Provider Last  Rate Last Dose  . 0.9 %  sodium chloride infusion   Intravenous Once Si Gaul, MD      . acetaminophen (TYLENOL) tablet 650 mg  650 mg Oral Once Si Gaul, MD   650 mg at 05/16/11 1047  . diphenhydrAMINE (BENADRYL) capsule 50 mg  50 mg Oral Once Si Gaul, MD   50 mg at 05/16/11 1047  . heparin lock flush 100 unit/mL  500 Units Intracatheter Once PRN Si Gaul, MD   500 Units at 05/16/11 1435  . riTUXimab (RITUXAN) 1,000 mg in sodium chloride 0.9 % 250 mL chemo infusion  375 mg/m2 (Treatment Plan Actual) Intravenous Once Si Gaul, MD   1,000 mg at 05/16/11 1245  . DISCONTD: sodium chloride 0.9 % injection 10 mL  10 mL Intracatheter PRN Si Gaul, MD   10 mL at 05/16/11 1435    REVIEW OF SYSTEMS:  A comprehensive review of systems was negative.   PHYSICAL EXAMINATION: General appearance: alert, cooperative and no distress Head: Normocephalic, without obvious abnormality, atraumatic Neck: no adenopathy Lymph nodes: Cervical, supraclavicular, and axillary nodes normal. Resp: clear to auscultation bilaterally Cardio: regular rate and rhythm, S1, S2 normal, no murmur, click, rub or gallop GI: soft, non-tender; bowel sounds normal; no masses,  no organomegaly  Extremities: extremities normal, atraumatic, no cyanosis or edema Neurologic: Alert and oriented X 3, normal strength and tone. Normal symmetric reflexes. Normal coordination and gait  ECOG PERFORMANCE STATUS: 0 - Asymptomatic  Blood pressure 126/72, pulse 58, temperature 96.9 F (36.1 C), temperature source Oral, height 5\' 10"  (1.778 m), weight 340 lb (154.223 kg).  LABORATORY DATA: Lab Results  Component Value Date   WBC 7.6 05/16/2011   HGB 13.7 05/16/2011   HCT 40.8 05/16/2011   MCV 89.5 05/16/2011   PLT 192 05/16/2011      Chemistry      Component Value Date/Time   NA 138 05/16/2011 0858   K 4.3 05/16/2011 0858   CL 102 05/16/2011 0858   CO2 22 05/16/2011 0858   BUN 22 05/16/2011 0858   CREATININE  1.04 05/16/2011 0858      Component Value Date/Time   CALCIUM 9.2 05/16/2011 0858   ALKPHOS 119* 05/16/2011 0858   AST 23 05/16/2011 0858   ALT 28 05/16/2011 0858   BILITOT 0.4 05/16/2011 0858       RADIOGRAPHIC STUDIES: Nm Pet Image Restag (ps) Skull Base To Thigh  12/06/2010  *RADIOLOGY REPORT*  Clinical Data: Subsequent treatment strategy for non-Hodgkins lymphoma.  NUCLEAR MEDICINE PET CT RESTAGING (PS) SKULL BASE TO THIGH  Technique:  90.00 mCi F-18 FDG was injected intravenously via the right antecubital fossa.  Full-ring PET imaging was performed from the skull base through the mid-thighs 56  minutes after injection. CT data was obtained and used for attenuation correction and anatomic localization only.  (This was not acquired as a diagnostic CT examination.)  Fasting Blood Glucose:  107  Patient Weight:  340 pounds.  Comparison: CT neck, chest, abdomen pelvis 08/02/2010 and PET CT 06/21/2010.  Findings: No abnormal hypermetabolism in the neck, chest, abdomen or pelvis.  There is fairly diffuse muscular activity, as before.  CT images were performed for attenuation correction.  There is complete opacification of the maxillary sinuses, with scattered opacification of the ethmoid air cells.  Coronary artery calcification.  Bladder wall appears thickened. External iliac lymph nodes measure up to 1.4 cm on the right, as before.  IMPRESSION:  1.  No evidence of active lymphoma. 2.  Coronary artery calcification.  Original Report Authenticated By: Reyes Ivan, M.D.    ASSESSMENT/PLAN: This is a very pleasant 56 years old white male with history of stage III follicular lymphoma, grade 2, status post 8 cycles of systemic chemotherapy with CHOP/Rituxan. The patient is doing fine and he has no evidence for disease recurrence.  He is currently receiving maintenance chemotherapy with Rituxan 375 mg/m2 every 2 months, status post 2 cycles. The patient was discussed with Dr. Arbutus Ped. He will proceed with his  third cycle of matenance Rituxan and follow up with Dr. Arbutus Ped in 2 months with a repeat CBC differential, CMET, and LDH and a CT of the chest, abdomen and pelvis with contrast to reevaluate his disease prior to his next scheduled cycle.   Laural Benes, Ladeana Laplant E, PA-C   All questions were answered. The patient knows to call the clinic with any problems, questions or concerns. We can certainly see the patient much sooner if necessary.

## 2011-06-10 ENCOUNTER — Telehealth: Payer: Self-pay | Admitting: Internal Medicine

## 2011-06-10 NOTE — Telephone Encounter (Signed)
Talked to pt, gave him appt for MD and chemo on July 2013

## 2011-06-21 ENCOUNTER — Other Ambulatory Visit: Payer: Self-pay | Admitting: Dermatology

## 2011-07-11 ENCOUNTER — Other Ambulatory Visit: Payer: 59

## 2011-07-11 ENCOUNTER — Ambulatory Visit (HOSPITAL_COMMUNITY)
Admission: RE | Admit: 2011-07-11 | Discharge: 2011-07-11 | Disposition: A | Discharge status: Home / Self Care | Payer: 59 | Source: Ambulatory Visit | Attending: Physician Assistant | Admitting: Physician Assistant

## 2011-07-11 ENCOUNTER — Encounter (HOSPITAL_COMMUNITY): Payer: Self-pay

## 2011-07-11 DIAGNOSIS — C8210 Follicular lymphoma grade II, unspecified site: Secondary | ICD-10-CM

## 2011-07-11 DIAGNOSIS — Z9221 Personal history of antineoplastic chemotherapy: Secondary | ICD-10-CM | POA: Insufficient documentation

## 2011-07-11 DIAGNOSIS — C8299 Follicular lymphoma, unspecified, extranodal and solid organ sites: Secondary | ICD-10-CM | POA: Insufficient documentation

## 2011-07-11 LAB — CMP (CANCER CENTER ONLY)
AST: 32 U/L (ref 11–38)
Albumin: 3.7 g/dL (ref 3.3–5.5)
BUN, Bld: 18 mg/dL (ref 7–22)
Calcium: 8.4 mg/dL (ref 8.0–10.3)
Chloride: 96 mEq/L — ABNORMAL LOW (ref 98–108)
Glucose, Bld: 101 mg/dL (ref 73–118)
Potassium: 4.2 mEq/L (ref 3.3–4.7)
Sodium: 138 mEq/L (ref 128–145)
Total Protein: 6.8 g/dL (ref 6.4–8.1)

## 2011-07-11 LAB — CBC WITH DIFFERENTIAL/PLATELET
Basophils Absolute: 0.1 10*3/uL (ref 0.0–0.1)
HCT: 39 % (ref 38.4–49.9)
HGB: 13 g/dL (ref 13.0–17.1)
MONO#: 0.8 10*3/uL (ref 0.1–0.9)
NEUT#: 4.7 10*3/uL (ref 1.5–6.5)
NEUT%: 67.1 % (ref 39.0–75.0)
RDW: 16.4 % — ABNORMAL HIGH (ref 11.0–14.6)
WBC: 7.1 10*3/uL (ref 4.0–10.3)
lymph#: 1.1 10*3/uL (ref 0.9–3.3)

## 2011-07-11 MED ORDER — IOHEXOL 300 MG/ML  SOLN
125.0000 mL | Freq: Once | INTRAMUSCULAR | Status: AC | PRN
  Administered 2011-07-11: 125 mL via INTRAVENOUS

## 2011-07-18 ENCOUNTER — Ambulatory Visit (HOSPITAL_BASED_OUTPATIENT_CLINIC_OR_DEPARTMENT_OTHER): Payer: 59 | Admitting: Internal Medicine

## 2011-07-18 ENCOUNTER — Encounter: Payer: Self-pay | Admitting: *Deleted

## 2011-07-18 ENCOUNTER — Ambulatory Visit (HOSPITAL_BASED_OUTPATIENT_CLINIC_OR_DEPARTMENT_OTHER): Payer: 59

## 2011-07-18 ENCOUNTER — Telehealth: Payer: Self-pay | Admitting: Internal Medicine

## 2011-07-18 VITALS — BP 99/57 | HR 66 | Temp 97.1°F | Ht 70.0 in | Wt 347.0 lb

## 2011-07-18 VITALS — BP 120/73 | HR 54 | Temp 97.6°F

## 2011-07-18 DIAGNOSIS — C8210 Follicular lymphoma grade II, unspecified site: Secondary | ICD-10-CM

## 2011-07-18 DIAGNOSIS — Z5112 Encounter for antineoplastic immunotherapy: Secondary | ICD-10-CM

## 2011-07-18 DIAGNOSIS — C8299 Follicular lymphoma, unspecified, extranodal and solid organ sites: Secondary | ICD-10-CM

## 2011-07-18 DIAGNOSIS — C8590 Non-Hodgkin lymphoma, unspecified, unspecified site: Secondary | ICD-10-CM

## 2011-07-18 MED ORDER — HEPARIN SOD (PORK) LOCK FLUSH 100 UNIT/ML IV SOLN
500.0000 [IU] | Freq: Once | INTRAVENOUS | Status: AC | PRN
  Administered 2011-07-18: 500 [IU]
  Filled 2011-07-18: qty 5

## 2011-07-18 MED ORDER — SODIUM CHLORIDE 0.9 % IJ SOLN
10.0000 mL | INTRAMUSCULAR | Status: DC | PRN
  Administered 2011-07-18: 10 mL
  Filled 2011-07-18: qty 10

## 2011-07-18 MED ORDER — ACETAMINOPHEN 325 MG PO TABS
650.0000 mg | ORAL_TABLET | Freq: Once | ORAL | Status: AC
  Administered 2011-07-18: 650 mg via ORAL

## 2011-07-18 MED ORDER — DIPHENHYDRAMINE HCL 25 MG PO CAPS
50.0000 mg | ORAL_CAPSULE | Freq: Once | ORAL | Status: AC
Start: 2011-07-18 — End: 2011-07-18
  Administered 2011-07-18: 50 mg via ORAL

## 2011-07-18 MED ORDER — SODIUM CHLORIDE 0.9 % IV SOLN
375.0000 mg/m2 | Freq: Once | INTRAVENOUS | Status: AC
  Administered 2011-07-18: 1000 mg via INTRAVENOUS
  Filled 2011-07-18: qty 100

## 2011-07-18 MED ORDER — SODIUM CHLORIDE 0.9 % IV SOLN
Freq: Once | INTRAVENOUS | Status: AC
  Administered 2011-07-18: 10:00:00 via INTRAVENOUS

## 2011-07-18 NOTE — Progress Notes (Signed)
[facility] [phone]   [phone]  OFFICE PROGRESS NOTE  [name], MD [address] [facility] [place]. [place] [address]  DIAGNOSIS: : Stage III follicular lymphoma, grade 2, diagnosed in June 2012   PRIOR THERAPY: Systemic chemotherapy with CHOP/Rituxan, status post 8 cycles, last dose was given 07/19/2010.  CURRENT THERAPY: Maintenance therapy with Rituxan 375 mg/m2 given every 2 months for a total of 2 years, status post 3 cycles.  INTERVAL HISTORY: [name] 56 y.o. male returns to the clinic today for two-month followup visit. He is doing fine today with no specific complaints except for fairly of his psoriasis. He denied having any significant weight loss or night sweats. No chest pain or shortness of breath. The patient rated his treatment with Rituxan fairly well. He has repeat CT scan of the chest, abdomen and pelvis performed recently and he is here today for evaluation and discussion of his scan results.  MEDICAL HISTORY: Past Medical History  Diagnosis Date  . Hypertension   . Venous insufficiency   . Dyslipidemia   . nhl dx'd 01/2010    ALLERGIES:  is allergic to penicillins.  MEDICATIONS:  Current Outpatient Prescriptions  Medication Sig Dispense Refill  . amLODipine (NORVASC) 5 MG tablet Take 5 mg by mouth daily.        [name] Kitchen BENICAR HCT 40-25 MG per tablet       . Cholecalciferol (VITAMIN D-3 PO) Take 1 tablet by mouth daily.        . Ibuprofen (ADVIL) 200 MG CAPS Take by mouth as needed.        . lidocaine-prilocaine (EMLA) cream Apply topically as needed.  30 g  1  . multivitamin-iron-minerals-folic acid (CENTRUM) chewable tablet Chew 1 tablet by mouth daily.      . Nebivolol HCl (BYSTOLIC PO) Take 0.5 tablets by mouth daily.        . Omega-3 Fatty Acids (FISH OIL PO) Take 1 tablet by mouth daily.        [name] Kitchen allopurinol (ZYLOPRIM) 100 MG tablet Take 100 mg by mouth daily.        [name] Kitchen  HYDROcodone-acetaminophen (VICODIN) 5-500 MG per tablet Take 1 tablet by mouth every 6 (six) hours as needed.          REVIEW OF SYSTEMS:  A comprehensive review of systems was negative.   PHYSICAL EXAMINATION: General appearance: alert, cooperative and no distress Head: Normocephalic, without obvious abnormality, atraumatic Neck: no adenopathy Lymph nodes: Cervical, supraclavicular, and axillary nodes normal. Resp: clear to auscultation bilaterally Cardio: regular rate and rhythm, S1, S2 normal, no murmur, click, rub or gallop GI: soft, non-tender; bowel sounds normal; no masses,  no organomegaly Extremities: extremities normal, atraumatic, no cyanosis or edema Neurologic: Alert and oriented X 3, normal strength and tone. Normal symmetric reflexes. Normal coordination and gait  ECOG PERFORMANCE STATUS: 0 - Asymptomatic  Blood pressure 99/57, pulse 66, temperature 97.1 F (36.2 C), height 5\' 10"  (1.778 m), weight 347 lb (157.398 kg).  LABORATORY DATA: Lab Results  Component Value Date   WBC 7.1 07/11/2011   HGB 13.0 07/11/2011   HCT 39.0 07/11/2011   MCV 91.1 07/11/2011   PLT 198 07/11/2011      Chemistry      Component Value Date/Time   NA 138 07/11/2011 1417   NA 138 05/16/2011 0858   K 4.2 07/11/2011 1417   K 4.3 05/16/2011 0858   CL 96*  07/11/2011 1417   CL 102 05/16/2011 0858   CO2 26 07/11/2011 1417   CO2 22 05/16/2011 0858   BUN 18 07/11/2011 1417   BUN 22 05/16/2011 0858   CREATININE 1.1 07/11/2011 1417   CREATININE 1.04 05/16/2011 0858      Component Value Date/Time   CALCIUM 8.4 07/11/2011 1417   CALCIUM 9.2 05/16/2011 0858   ALKPHOS 124* 07/11/2011 1417   ALKPHOS 119* 05/16/2011 0858   AST 32 07/11/2011 1417   AST 23 05/16/2011 0858   ALT 28 05/16/2011 0858   BILITOT 0.50 07/11/2011 1417   BILITOT 0.4 05/16/2011 0858       RADIOGRAPHIC STUDIES: Ct Chest W Contrast  07/11/2011  *RADIOLOGY REPORT*  Clinical Data:  Non Hodgkin's lymphoma.  Chemotherapy completed July 19, 2010  CT CHEST, ABDOMEN AND  PELVIS WITH CONTRAST  Technique:  Multidetector CT imaging of the chest, abdomen and pelvis was performed following the standard protocol during bolus administration of intravenous contrast.  Contrast: OMNIPAQUE IOHEXOL 300 MG/ML  SOLN  Comparison:  CT 12/06/2010  CT CHEST  Findings:  There is a port in the left anterior chest wall.  No axillary or supraclavicular lymphadenopathy.  No mediastinal or hilar lymphadenopathy.  No pericardial fluid.  Esophagus is normal. Review of the lung parenchyma demonstrates no new or suspicious pulmonary nodules.  IMPRESSION: No evidence of lymphoma recurrence  CT ABDOMEN AND PELVIS  Findings:  Tiny subcapsular hypodensity the right hepatic lobe measures 6 mm (image 57) and is unchanged.    The gallbladder, pancreas, spleen, adrenal glands, and kidneys are normal.  The stomach, small bowel, and colon are normal.  Abdominal aorta is normal caliber.  No retroperitoneal  or periportal lymphadenopathy.  No free  fluid the pelvis.  No pelvic lymphadenopathy.  Prostate gland bladder normal. Review of  bone windows demonstrates no aggressive osseous lesions.  No evidence of lymphoma  IMPRESSION: No evidence of lymphoma recurrence in the abdomen or pelvis.  Original Report Authenticated By: Genevive Bi, M.D.   Ct Abdomen Pelvis W Contrast  07/11/2011  *RADIOLOGY REPORT*  Clinical Data:  Non Hodgkin's lymphoma.  Chemotherapy completed July 19, 2010  CT CHEST, ABDOMEN AND PELVIS WITH CONTRAST  Technique:  Multidetector CT imaging of the chest, abdomen and pelvis was performed following the standard protocol during bolus administration of intravenous contrast.  Contrast: OMNIPAQUE IOHEXOL 300 MG/ML  SOLN  Comparison:  CT 12/06/2010  CT CHEST  Findings:  There is a port in the left anterior chest wall.  No axillary or supraclavicular lymphadenopathy.  No mediastinal or hilar lymphadenopathy.  No pericardial fluid.  Esophagus is normal. Review of the lung parenchyma  demonstrates no new or suspicious pulmonary nodules.  IMPRESSION: No evidence of lymphoma recurrence  CT ABDOMEN AND PELVIS  Findings:  Tiny subcapsular hypodensity the right hepatic lobe measures 6 mm (image 57) and is unchanged.    The gallbladder, pancreas, spleen, adrenal glands, and kidneys are normal.  The stomach, small bowel, and colon are normal.  Abdominal aorta is normal caliber.  No retroperitoneal  or periportal lymphadenopathy.  No free  fluid the pelvis.  No pelvic lymphadenopathy.  Prostate gland bladder normal. Review of  bone windows demonstrates no aggressive osseous lesions.  No evidence of lymphoma  IMPRESSION: No evidence of lymphoma recurrence in the abdomen or pelvis.  Original Report Authenticated By: Genevive Bi, M.D.    ASSESSMENT: This is a very pleasant 56 years old white male with stage III  follicular lymphoma status post chemotherapy with CHOP/Rituxan, and currently undergoing maintenance Rituxan status post 3 cycles. The patient is tolerating his treatment fairly well and he has no evidence for disease recurrence.  PLAN: I discussed the scan results with the patient and recommended for him to continue his maintenance Rituxan at the same regimen. He will receive cycle #4 today. He would come back for followup visit in 2 months with the start of cycle #5. He was advised to call me immediately she has any concerning symptoms in the interval.  All questions were answered. The patient knows to call the clinic with any problems, questions or concerns. We can certainly see the patient much sooner if necessary.  I spent 15 minutes counseling the patient face to face. The total time spent in the appointment was 25 minutes.

## 2011-07-18 NOTE — Progress Notes (Signed)
Spoke with pt at [facility] today. No questions or concerns at this time.  

## 2011-07-18 NOTE — Patient Instructions (Addendum)
[facility] Discharge Instructions for Patients Receiving Chemotherapy  Today you received the following chemotherapy agents Rituxan   To help prevent nausea and vomiting after your treatment, we encourage you to take your nausea medication    If you develop nausea and vomiting that is not controlled by your nausea medication, call the clinic. If it is after clinic hours your family physician or the after hours number for the clinic or go to the Emergency Department.   BELOW ARE SYMPTOMS THAT SHOULD BE REPORTED IMMEDIATELY:  *FEVER GREATER THAN 100.5 F  *CHILLS WITH OR WITHOUT FEVER  NAUSEA AND VOMITING THAT IS NOT CONTROLLED WITH YOUR NAUSEA MEDICATION  *UNUSUAL SHORTNESS OF BREATH  *UNUSUAL BRUISING OR BLEEDING  TENDERNESS IN MOUTH AND THROAT WITH OR WITHOUT PRESENCE OF ULCERS  *URINARY PROBLEMS  *BOWEL PROBLEMS  UNUSUAL RASH Items with * indicate a potential emergency and should be followed up as soon as possible.  One of the nurses will contact you 24 hours after your treatment. Please let the nurse know about any problems that you may have experienced. Feel free to call the clinic you have any questions or concerns. The clinic phone number is [phone].   I have been informed and understand all the instructions given to me. I know to contact the clinic, my physician, or go to the Emergency Department if any problems should occur. I do not have any questions at this time, but understand that I may call the clinic during office hours   should I have any questions or need assistance in obtaining follow up care.    __________________________________________  _____________  __________ Signature of Patient or Authorized Representative            Date                   Time    __________________________________________ Nurse's Signature    

## 2011-07-18 NOTE — Telephone Encounter (Signed)
appts made and printed for pt,pt req 9/16 as he can do only mondays and will be out of town 9/9  aom

## 2011-07-25 DIAGNOSIS — L409 Psoriasis, unspecified: Secondary | ICD-10-CM | POA: Insufficient documentation

## 2011-09-26 ENCOUNTER — Telehealth: Payer: Self-pay | Admitting: Oncology

## 2011-09-26 ENCOUNTER — Ambulatory Visit (HOSPITAL_BASED_OUTPATIENT_CLINIC_OR_DEPARTMENT_OTHER): Payer: 59

## 2011-09-26 ENCOUNTER — Other Ambulatory Visit (HOSPITAL_BASED_OUTPATIENT_CLINIC_OR_DEPARTMENT_OTHER): Payer: 59 | Admitting: Lab

## 2011-09-26 ENCOUNTER — Ambulatory Visit (HOSPITAL_BASED_OUTPATIENT_CLINIC_OR_DEPARTMENT_OTHER): Payer: 59 | Admitting: Physician Assistant

## 2011-09-26 ENCOUNTER — Encounter: Payer: Self-pay | Admitting: Physician Assistant

## 2011-09-26 VITALS — BP 111/67 | HR 65 | Temp 97.1°F | Resp 20

## 2011-09-26 VITALS — BP 138/78 | HR 62 | Temp 97.2°F | Resp 20 | Ht 70.0 in | Wt 346.1 lb

## 2011-09-26 DIAGNOSIS — C8210 Follicular lymphoma grade II, unspecified site: Secondary | ICD-10-CM

## 2011-09-26 DIAGNOSIS — C8299 Follicular lymphoma, unspecified, extranodal and solid organ sites: Secondary | ICD-10-CM

## 2011-09-26 DIAGNOSIS — C8590 Non-Hodgkin lymphoma, unspecified, unspecified site: Secondary | ICD-10-CM

## 2011-09-26 DIAGNOSIS — Z5112 Encounter for antineoplastic immunotherapy: Secondary | ICD-10-CM

## 2011-09-26 LAB — COMPREHENSIVE METABOLIC PANEL (CC13)
AST: 20 U/L (ref 5–34)
BUN: 16 mg/dL (ref 7.0–26.0)
Calcium: 9.4 mg/dL (ref 8.4–10.4)
Chloride: 105 mEq/L (ref 98–107)
Creatinine: 0.9 mg/dL (ref 0.7–1.3)
Glucose: 101 mg/dl — ABNORMAL HIGH (ref 70–99)

## 2011-09-26 LAB — CBC WITH DIFFERENTIAL/PLATELET
BASO%: 1.3 % (ref 0.0–2.0)
EOS%: 5.5 % (ref 0.0–7.0)
HCT: 39.3 % (ref 38.4–49.9)
LYMPH%: 17.6 % (ref 14.0–49.0)
MCH: 30 pg (ref 27.2–33.4)
MCHC: 33.3 g/dL (ref 32.0–36.0)
MONO%: 9.2 % (ref 0.0–14.0)
NEUT%: 66.4 % (ref 39.0–75.0)
Platelets: 204 10*3/uL (ref 140–400)

## 2011-09-26 MED ORDER — ACETAMINOPHEN 325 MG PO TABS
650.0000 mg | ORAL_TABLET | Freq: Once | ORAL | Status: AC
  Administered 2011-09-26: 650 mg via ORAL

## 2011-09-26 MED ORDER — SODIUM CHLORIDE 0.9 % IJ SOLN
10.0000 mL | INTRAMUSCULAR | Status: DC | PRN
  Administered 2011-09-26: 10 mL
  Filled 2011-09-26: qty 10

## 2011-09-26 MED ORDER — SODIUM CHLORIDE 0.9 % IV SOLN
Freq: Once | INTRAVENOUS | Status: AC
  Administered 2011-09-26: 10:00:00 via INTRAVENOUS

## 2011-09-26 MED ORDER — SODIUM CHLORIDE 0.9 % IV SOLN
375.0000 mg/m2 | Freq: Once | INTRAVENOUS | Status: AC
  Administered 2011-09-26: 1000 mg via INTRAVENOUS
  Filled 2011-09-26: qty 100

## 2011-09-26 MED ORDER — HEPARIN SOD (PORK) LOCK FLUSH 100 UNIT/ML IV SOLN
500.0000 [IU] | Freq: Once | INTRAVENOUS | Status: AC | PRN
  Administered 2011-09-26: 500 [IU]
  Filled 2011-09-26: qty 5

## 2011-09-26 MED ORDER — DIPHENHYDRAMINE HCL 25 MG PO CAPS
50.0000 mg | ORAL_CAPSULE | Freq: Once | ORAL | Status: AC
  Administered 2011-09-26: 50 mg via ORAL

## 2011-09-26 NOTE — Telephone Encounter (Signed)
Gave appt for November 2013 lab, ML and chemo

## 2011-09-26 NOTE — Patient Instructions (Addendum)
Follow up in 2 months prior to your next cycle of chemotherapy

## 2011-09-26 NOTE — Patient Instructions (Addendum)
[facility] Discharge Instructions for Patients Receiving Chemotherapy  Today you received the following chemotherapy agents Rituxan   To help prevent nausea and vomiting after your treatment, we encourage you to take your nausea medication    If you develop nausea and vomiting that is not controlled by your nausea medication, call the clinic. If it is after clinic hours your family physician or the after hours number for the clinic or go to the Emergency Department.   BELOW ARE SYMPTOMS THAT SHOULD BE REPORTED IMMEDIATELY:  *FEVER GREATER THAN 100.5 F  *CHILLS WITH OR WITHOUT FEVER  NAUSEA AND VOMITING THAT IS NOT CONTROLLED WITH YOUR NAUSEA MEDICATION  *UNUSUAL SHORTNESS OF BREATH  *UNUSUAL BRUISING OR BLEEDING  TENDERNESS IN MOUTH AND THROAT WITH OR WITHOUT PRESENCE OF ULCERS  *URINARY PROBLEMS  *BOWEL PROBLEMS  UNUSUAL RASH Items with * indicate a potential emergency and should be followed up as soon as possible.  One of the nurses will contact you 24 hours after your treatment. Please let the nurse know about any problems that you may have experienced. Feel free to call the clinic you have any questions or concerns. The clinic phone number is [phone].   I have been informed and understand all the instructions given to me. I know to contact the clinic, my physician, or go to the Emergency Department if any problems should occur. I do not have any questions at this time, but understand that I may call the clinic during office hours   should I have any questions or need assistance in obtaining follow up care.    __________________________________________  _____________  __________ Signature of Patient or Authorized Representative            Date                   Time    __________________________________________ Nurse's Signature    

## 2011-09-26 NOTE — Progress Notes (Signed)
Discharged with male friend at 71.  Ambulatory in no distress.

## 2011-09-26 NOTE — Progress Notes (Signed)
[facility] Telephone:([phone]   Fax:(336) [phone]  OFFICE PROGRESS NOTE  [name], MD [address] [facility], [place]. [place] [address]  DIAGNOSIS: : Stage III follicular lymphoma, grade 2, diagnosed in June 2012   PRIOR THERAPY: Systemic chemotherapy with CHOP/Rituxan, status post 8 cycles, last dose was given 07/19/2010.  CURRENT THERAPY: Maintenance therapy with Rituxan 375 mg/m2 given every 2 months for a total of 2 years, status post 3 cycles.  INTERVAL HISTORY: [name] 56 y.o. male returns to the clinic today for two-month followup visit. He is doing fine today with no specific complaints except for his psoriasis. He denied having any significant weight loss or night sweats. No chest pain or shortness of breath. He continues to tolerate his treatment with Rituxan fairly well. He does not require any prescription refills today. He notes some swelling of his left foot and ankle. 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 He does report that this is happened periodically in the past and it usually resolves on its own. He denied any pain or redness.  MEDICAL HISTORY: Past Medical History  Diagnosis Date  . Hypertension   . Venous insufficiency   . Dyslipidemia   . nhl dx'd 01/2010    ALLERGIES:  is allergic to penicillins.  MEDICATIONS:  Current Outpatient Prescriptions  Medication Sig Dispense Refill  . HYDROcodone-acetaminophen (VICODIN) 5-500 MG per tablet Take 1 tablet by mouth every 6 (six) hours as needed.        [name] Kitchen allopurinol (ZYLOPRIM) 100 MG tablet Take 100 mg by mouth daily.        [name] Kitchen amLODipine (NORVASC) 5 MG tablet Take 5 mg by mouth daily.        [name] Kitchen BENICAR HCT 40-25 MG per tablet       . Cholecalciferol (VITAMIN D-3 PO) Take 1 tablet by mouth daily.        . Ibuprofen (ADVIL) 200 MG CAPS Take by mouth as needed.        . lidocaine-prilocaine (EMLA) cream Apply topically as  needed.  30 g  1  . multivitamin-iron-minerals-folic acid (CENTRUM) chewable tablet Chew 1 tablet by mouth daily.      . mupirocin ointment (BACTROBAN) 2 %       . Nebivolol HCl (BYSTOLIC PO) Take 0.5 tablets by mouth daily.        . Omega-3 Fatty Acids (FISH OIL PO) Take 1 tablet by mouth daily.          REVIEW OF SYSTEMS:  A comprehensive review of systems was negative.   PHYSICAL EXAMINATION: General appearance: alert, cooperative and no distress Head: Normocephalic, without obvious abnormality, atraumatic Neck: no adenopathy Lymph nodes: Cervical, supraclavicular, and axillary nodes normal. Resp: clear to auscultation bilaterally Cardio: regular rate and rhythm, S1, S2 normal, no murmur, click, rub or gallop GI: soft, non-tender; bowel sounds normal; no masses,  no organomegaly Extremities: edema Trace pitting edema right lower extremity, trace to 1+ pitting edema left lower extremity. No palpable cords, no tenderness or redness on either lower extremity Neurologic: Alert and oriented X 3, normal strength and tone. Normal symmetric reflexes. Normal coordination and gait  ECOG PERFORMANCE STATUS: 0 - Asymptomatic  Blood pressure 138/78, pulse 62, temperature 97.2 F (36.2 C), resp. rate 20, height 5\' 10"  (1.778 m), weight 346 lb 1.6 oz (156.99 kg).  LABORATORY DATA: Lab Results  Component Value Date  WBC 8.0 09/26/2011   HGB 13.1 09/26/2011   HCT 39.3 09/26/2011   MCV 90.1 09/26/2011   PLT 204 09/26/2011      Chemistry      Component Value Date/Time   NA 138 07/11/2011 1417   NA 138 05/16/2011 0858   K 4.2 07/11/2011 1417   K 4.3 05/16/2011 0858   CL 96* 07/11/2011 1417   CL 102 05/16/2011 0858   CO2 26 07/11/2011 1417   CO2 22 05/16/2011 0858   BUN 18 07/11/2011 1417   BUN 22 05/16/2011 0858   CREATININE 1.1 07/11/2011 1417   CREATININE 1.04 05/16/2011 0858      Component Value Date/Time   CALCIUM 8.4 07/11/2011 1417   CALCIUM 9.2 05/16/2011 0858   ALKPHOS 124* 07/11/2011 1417   ALKPHOS 119*  05/16/2011 0858   AST 32 07/11/2011 1417   AST 23 05/16/2011 0858   ALT 28 05/16/2011 0858   BILITOT 0.50 07/11/2011 1417   BILITOT 0.4 05/16/2011 0858       RADIOGRAPHIC STUDIES: Ct Chest W Contrast  07/11/2011  *RADIOLOGY REPORT*  Clinical Data:  Non Hodgkin's lymphoma.  Chemotherapy completed July 19, 2010  CT CHEST, ABDOMEN AND PELVIS WITH CONTRAST  Technique:  Multidetector CT imaging of the chest, abdomen and pelvis was performed following the standard protocol during bolus administration of intravenous contrast.  Contrast: OMNIPAQUE IOHEXOL 300 MG/ML  SOLN  Comparison:  CT 12/06/2010  CT CHEST  Findings:  There is a port in the left anterior chest wall.  No axillary or supraclavicular lymphadenopathy.  No mediastinal or hilar lymphadenopathy.  No pericardial fluid.  Esophagus is normal. Review of the lung parenchyma demonstrates no new or suspicious pulmonary nodules.  IMPRESSION: No evidence of lymphoma recurrence  CT ABDOMEN AND PELVIS  Findings:  Tiny subcapsular hypodensity the right hepatic lobe measures 6 mm (image 57) and is unchanged.    The gallbladder, pancreas, spleen, adrenal glands, and kidneys are normal.  The stomach, small bowel, and colon are normal.  Abdominal aorta is normal caliber.  No retroperitoneal  or periportal lymphadenopathy.  No free  fluid the pelvis.  No pelvic lymphadenopathy.  Prostate gland bladder normal. Review of  bone windows demonstrates no aggressive osseous lesions.  No evidence of lymphoma  IMPRESSION: No evidence of lymphoma recurrence in the abdomen or pelvis.  Original Report Authenticated By: Genevive Bi, M.D.   Ct Abdomen Pelvis W Contrast  07/11/2011  *RADIOLOGY REPORT*  Clinical Data:  Non Hodgkin's lymphoma.  Chemotherapy completed July 19, 2010  CT CHEST, ABDOMEN AND PELVIS WITH CONTRAST  Technique:  Multidetector CT imaging of the chest, abdomen and pelvis was performed following the standard protocol during bolus administration of intravenous  contrast.  Contrast: OMNIPAQUE IOHEXOL 300 MG/ML  SOLN  Comparison:  CT 12/06/2010  CT CHEST  Findings:  There is a port in the left anterior chest wall.  No axillary or supraclavicular lymphadenopathy.  No mediastinal or hilar lymphadenopathy.  No pericardial fluid.  Esophagus is normal. Review of the lung parenchyma demonstrates no new or suspicious pulmonary nodules.  IMPRESSION: No evidence of lymphoma recurrence  CT ABDOMEN AND PELVIS  Findings:  Tiny subcapsular hypodensity the right hepatic lobe measures 6 mm (image 57) and is unchanged.    The gallbladder, pancreas, spleen, adrenal glands, and kidneys are normal.  The stomach, small bowel, and colon are normal.  Abdominal aorta is normal caliber.  No retroperitoneal  or periportal lymphadenopathy.  No free  fluid the pelvis.  No pelvic lymphadenopathy.  Prostate gland bladder normal. Review of  bone windows demonstrates no aggressive osseous lesions.  No evidence of lymphoma  IMPRESSION: No evidence of lymphoma recurrence in the abdomen or pelvis.  Original Report Authenticated By: Genevive Bi, M.D.    ASSESSMENT/PLAN: This is a very pleasant 56 years old white male with stage III follicular lymphoma status post chemotherapy with CHOP/Rituxan, and currently undergoing maintenance Rituxan status post 4 cycles. The patient is tolerating his treatment fairly well and he has no evidence for disease recurrence. Patient was discussed with Dr. Arbutus Ped. He will continue with his maintenance Rituxan return in 2 months prior to his next scheduled cycle of maintenance Rituxan. He is to keep benign his lower extremity edema and should he become persistent or worsen he is to notify us as this may need to be evaluated further. Patient voiced understanding.  Laural Benes, Kasin Tonkinson E, PA-C   All questions were answered. The patient knows to call the clinic with any problems, questions or concerns. We can certainly see the patient much sooner if necessary.  I  spent 20 minutes counseling the patient face to face. The total time spent in the appointment was 30 minutes.

## 2011-09-26 NOTE — Progress Notes (Signed)
Rituxan infusing at max rate of 400 mg/hr.  VS, continue infusion

## 2011-11-21 ENCOUNTER — Encounter: Payer: Self-pay | Admitting: Physician Assistant

## 2011-11-21 ENCOUNTER — Ambulatory Visit (HOSPITAL_BASED_OUTPATIENT_CLINIC_OR_DEPARTMENT_OTHER): Payer: 59 | Admitting: Physician Assistant

## 2011-11-21 ENCOUNTER — Telehealth: Payer: Self-pay | Admitting: Internal Medicine

## 2011-11-21 ENCOUNTER — Other Ambulatory Visit: Payer: Self-pay | Admitting: Internal Medicine

## 2011-11-21 ENCOUNTER — Other Ambulatory Visit (HOSPITAL_BASED_OUTPATIENT_CLINIC_OR_DEPARTMENT_OTHER): Payer: 59 | Admitting: Lab

## 2011-11-21 ENCOUNTER — Ambulatory Visit (HOSPITAL_BASED_OUTPATIENT_CLINIC_OR_DEPARTMENT_OTHER): Payer: 59

## 2011-11-21 VITALS — BP 141/78 | HR 64 | Temp 96.8°F | Resp 20 | Ht 70.0 in | Wt 341.9 lb

## 2011-11-21 VITALS — BP 122/61 | HR 58 | Temp 97.4°F

## 2011-11-21 DIAGNOSIS — C8210 Follicular lymphoma grade II, unspecified site: Secondary | ICD-10-CM

## 2011-11-21 DIAGNOSIS — C8299 Follicular lymphoma, unspecified, extranodal and solid organ sites: Secondary | ICD-10-CM

## 2011-11-21 DIAGNOSIS — Z5112 Encounter for antineoplastic immunotherapy: Secondary | ICD-10-CM

## 2011-11-21 LAB — CBC WITH DIFFERENTIAL/PLATELET
Basophils Absolute: 0.1 10*3/uL (ref 0.0–0.1)
EOS%: 4.4 % (ref 0.0–7.0)
Eosinophils Absolute: 0.4 10*3/uL (ref 0.0–0.5)
HCT: 39.4 % (ref 38.4–49.9)
HGB: 13 g/dL (ref 13.0–17.1)
LYMPH%: 15.4 % (ref 14.0–49.0)
MCH: 29.2 pg (ref 27.2–33.4)
MCV: 88.5 fL (ref 79.3–98.0)
MONO%: 8.8 % (ref 0.0–14.0)
NEUT#: 6 10*3/uL (ref 1.5–6.5)
NEUT%: 70 % (ref 39.0–75.0)
Platelets: 255 10*3/uL (ref 140–400)
RDW: 14.9 % — ABNORMAL HIGH (ref 11.0–14.6)
lymph#: 1.3 10*3/uL (ref 0.9–3.3)

## 2011-11-21 LAB — COMPREHENSIVE METABOLIC PANEL (CC13)
Alkaline Phosphatase: 128 U/L (ref 40–150)
BUN: 17 mg/dL (ref 7.0–26.0)
CO2: 26 mEq/L (ref 22–29)
Creatinine: 0.9 mg/dL (ref 0.7–1.3)
Glucose: 92 mg/dl (ref 70–99)
Sodium: 138 mEq/L (ref 136–145)
Total Bilirubin: 0.5 mg/dL (ref 0.20–1.20)

## 2011-11-21 LAB — LACTATE DEHYDROGENASE (CC13): LDH: 185 U/L (ref 125–245)

## 2011-11-21 MED ORDER — SODIUM CHLORIDE 0.9 % IV SOLN
Freq: Once | INTRAVENOUS | Status: AC
  Administered 2011-11-21: 11:00:00 via INTRAVENOUS

## 2011-11-21 MED ORDER — ACETAMINOPHEN 325 MG PO TABS
650.0000 mg | ORAL_TABLET | Freq: Once | ORAL | Status: AC
  Administered 2011-11-21: 650 mg via ORAL

## 2011-11-21 MED ORDER — DIPHENHYDRAMINE HCL 25 MG PO CAPS
50.0000 mg | ORAL_CAPSULE | Freq: Once | ORAL | Status: AC
  Administered 2011-11-21: 50 mg via ORAL

## 2011-11-21 MED ORDER — HEPARIN SOD (PORK) LOCK FLUSH 100 UNIT/ML IV SOLN
500.0000 [IU] | Freq: Once | INTRAVENOUS | Status: AC | PRN
  Administered 2011-11-21: 500 [IU]
  Filled 2011-11-21: qty 5

## 2011-11-21 MED ORDER — SODIUM CHLORIDE 0.9 % IJ SOLN
10.0000 mL | INTRAMUSCULAR | Status: DC | PRN
  Administered 2011-11-21: 10 mL
  Filled 2011-11-21: qty 10

## 2011-11-21 MED ORDER — SODIUM CHLORIDE 0.9 % IV SOLN
375.0000 mg/m2 | Freq: Once | INTRAVENOUS | Status: AC
  Administered 2011-11-21: 1000 mg via INTRAVENOUS
  Filled 2011-11-21: qty 100

## 2011-11-21 NOTE — Telephone Encounter (Signed)
appts made and printed for pt ,pt aware that cen. Sch. Will call with scan appt,email sent to [facility] w for 01/16/12 appt      [name]

## 2011-11-21 NOTE — Progress Notes (Signed)
[facility] Telephone:([phone]   Fax:(336) [phone]  OFFICE PROGRESS NOTE  [name], MD [address] [facility], [place]. [place] [address]  DIAGNOSIS: : Stage III follicular lymphoma, grade 2, diagnosed in June 2012   PRIOR THERAPY: Systemic chemotherapy with CHOP/Rituxan, status post 8 cycles, last dose was given 07/19/2010.  CURRENT THERAPY: Maintenance therapy with Rituxan 375 mg/m2 given every 2 months for a total of 2 years, status post 5 cycles.  INTERVAL HISTORY: [name] 56 y.o. male returns to the clinic today for two-month followup visit. He is doing fine today with no specific complaints.  He denied having any significant weight loss or night sweats. No chest pain or shortness of breath. He continues to tolerate his treatment with Rituxan fairly well. He does not require any prescription refills today.   MEDICAL HISTORY: Past Medical History  Diagnosis Date  . Hypertension   . Venous insufficiency   . Dyslipidemia   . nhl dx'd 01/2010    ALLERGIES:  is allergic to penicillins.  MEDICATIONS:  Current Outpatient Prescriptions  Medication Sig Dispense Refill  . allopurinol (ZYLOPRIM) 100 MG tablet Take 100 mg by mouth daily.        [name] Kitchen amLODipine (NORVASC) 5 MG tablet Take 5 mg by mouth daily.        [name] Kitchen BENICAR HCT 40-25 MG per tablet       . Cholecalciferol (VITAMIN D-3 PO) Take 1 tablet by mouth daily.        [name] Kitchen HYDROcodone-acetaminophen (VICODIN) 5-500 MG per tablet Take 1 tablet by mouth every 6 (six) hours as needed.        . Ibuprofen (ADVIL) 200 MG CAPS Take by mouth as needed.        . lidocaine-prilocaine (EMLA) cream Apply topically as needed.  30 g  1  . multivitamin-iron-minerals-folic acid (CENTRUM) chewable tablet Chew 1 tablet by mouth daily.      . mupirocin ointment (BACTROBAN) 2 %       . Nebivolol HCl (BYSTOLIC PO) Take 0.5 tablets by mouth daily.        . Omega-3 Fatty Acids (FISH OIL PO) Take 1  tablet by mouth daily.          REVIEW OF SYSTEMS:  A comprehensive review of systems was negative.   PHYSICAL EXAMINATION: General appearance: alert, cooperative and no distress Head: Normocephalic, without obvious abnormality, atraumatic Neck: no adenopathy Lymph nodes: Cervical, supraclavicular, and axillary nodes normal. Resp: clear to auscultation bilaterally Cardio: regular rate and rhythm, S1, S2 normal, no murmur, click, rub or gallop GI: soft, non-tender; bowel sounds normal; no masses,  no organomegaly Extremities: edema trace pitting edema bilateral lower extremities Neurologic: Alert and oriented X 3, normal strength and tone. Normal symmetric reflexes. Normal coordination and gait  ECOG PERFORMANCE STATUS: 0 - Asymptomatic  Blood pressure 141/78, pulse 64, temperature 96.8 F (36 C), temperature source Oral, resp. rate 20, height 5\' 10"  (1.778 m), weight 341 lb 14.4 oz (155.085 kg).  LABORATORY DATA: Lab Results  Component Value Date   WBC 8.6 11/21/2011   HGB 13.0 11/21/2011   HCT 39.4 11/21/2011   MCV 88.5 11/21/2011   PLT 255 11/21/2011      Chemistry      Component Value Date/Time   NA 138 09/26/2011 0854   NA 138 07/11/2011 1417   NA 138 05/16/2011 0858   K 4.1 09/26/2011 0854   K 4.2 07/11/2011  1417   K 4.3 05/16/2011 0858   CL 105 09/26/2011 0854   CL 96* 07/11/2011 1417   CL 102 05/16/2011 0858   CO2 21* 09/26/2011 0854   CO2 26 07/11/2011 1417   CO2 22 05/16/2011 0858   BUN 16.0 09/26/2011 0854   BUN 18 07/11/2011 1417   BUN 22 05/16/2011 0858   CREATININE 0.9 09/26/2011 0854   CREATININE 1.1 07/11/2011 1417   CREATININE 1.04 05/16/2011 0858      Component Value Date/Time   CALCIUM 9.4 09/26/2011 0854   CALCIUM 8.4 07/11/2011 1417   CALCIUM 9.2 05/16/2011 0858   ALKPHOS 113 09/26/2011 0854   ALKPHOS 124* 07/11/2011 1417   ALKPHOS 119* 05/16/2011 0858   AST 20 09/26/2011 0854   AST 32 07/11/2011 1417   AST 23 05/16/2011 0858   ALT 25 09/26/2011 0854   ALT 28 05/16/2011 0858    BILITOT 0.40 09/26/2011 0854   BILITOT 0.50 07/11/2011 1417   BILITOT 0.4 05/16/2011 0858       RADIOGRAPHIC STUDIES: Ct Chest W Contrast  07/11/2011  *RADIOLOGY REPORT*  Clinical Data:  Non Hodgkin's lymphoma.  Chemotherapy completed July 19, 2010  CT CHEST, ABDOMEN AND PELVIS WITH CONTRAST  Technique:  Multidetector CT imaging of the chest, abdomen and pelvis was performed following the standard protocol during bolus administration of intravenous contrast.  Contrast: OMNIPAQUE IOHEXOL 300 MG/ML  SOLN  Comparison:  CT 12/06/2010  CT CHEST  Findings:  There is a port in the left anterior chest wall.  No axillary or supraclavicular lymphadenopathy.  No mediastinal or hilar lymphadenopathy.  No pericardial fluid.  Esophagus is normal. Review of the lung parenchyma demonstrates no new or suspicious pulmonary nodules.  IMPRESSION: No evidence of lymphoma recurrence  CT ABDOMEN AND PELVIS  Findings:  Tiny subcapsular hypodensity the right hepatic lobe measures 6 mm (image 57) and is unchanged.    The gallbladder, pancreas, spleen, adrenal glands, and kidneys are normal.  The stomach, small bowel, and colon are normal.  Abdominal aorta is normal caliber.  No retroperitoneal  or periportal lymphadenopathy.  No free  fluid the pelvis.  No pelvic lymphadenopathy.  Prostate gland bladder normal. Review of  bone windows demonstrates no aggressive osseous lesions.  No evidence of lymphoma  IMPRESSION: No evidence of lymphoma recurrence in the abdomen or pelvis.  Original Report Authenticated By: Genevive Bi, M.D.   Ct Abdomen Pelvis W Contrast  07/11/2011  *RADIOLOGY REPORT*  Clinical Data:  Non Hodgkin's lymphoma.  Chemotherapy completed July 19, 2010  CT CHEST, ABDOMEN AND PELVIS WITH CONTRAST  Technique:  Multidetector CT imaging of the chest, abdomen and pelvis was performed following the standard protocol during bolus administration of intravenous contrast.  Contrast: OMNIPAQUE IOHEXOL 300 MG/ML  SOLN   Comparison:  CT 12/06/2010  CT CHEST  Findings:  There is a port in the left anterior chest wall.  No axillary or supraclavicular lymphadenopathy.  No mediastinal or hilar lymphadenopathy.  No pericardial fluid.  Esophagus is normal. Review of the lung parenchyma demonstrates no new or suspicious pulmonary nodules.  IMPRESSION: No evidence of lymphoma recurrence  CT ABDOMEN AND PELVIS  Findings:  Tiny subcapsular hypodensity the right hepatic lobe measures 6 mm (image 57) and is unchanged.    The gallbladder, pancreas, spleen, adrenal glands, and kidneys are normal.  The stomach, small bowel, and colon are normal.  Abdominal aorta is normal caliber.  No retroperitoneal  or periportal lymphadenopathy.  No free  fluid the pelvis.  No pelvic lymphadenopathy.  Prostate gland bladder normal. Review of  bone windows demonstrates no aggressive osseous lesions.  No evidence of lymphoma  IMPRESSION: No evidence of lymphoma recurrence in the abdomen or pelvis.  Original Report Authenticated By: Genevive Bi, M.D.    ASSESSMENT/PLAN: This is a very pleasant 56 years old white male with stage III follicular lymphoma status post chemotherapy with CHOP/Rituxan, and currently undergoing maintenance Rituxan status post 5 cycles. The patient is tolerating his treatment fairly well and he has no evidence for disease recurrence. Patient was discussed with Dr. Arbutus Ped. He will continue with his maintenance Rituxan. He'll followup with Dr. Arbutus Ped in 2 months prior to his next scheduled cycle of maintenance Rituxan with a restaging CT scan of the chest, abdomen and pelvis with contrast to reevaluate his disease. [name] Kitchen  Laural Benes, Kyria Bumgardner E, PA-C   All questions were answered. The patient knows to call the clinic with any problems, questions or concerns. We can certainly see the patient much sooner if necessary.  I spent 20 minutes counseling the patient face to face. The total time spent in the appointment was 30 minutes.

## 2011-11-21 NOTE — Patient Instructions (Addendum)
Continue with your maintenance Rituxan as scheduled Follow up with Dr. Arbutus Ped in 2 months with restaging CT scan of your chest, abdomen and pelvis

## 2011-11-21 NOTE — Patient Instructions (Addendum)
[facility] Discharge Instructions for Patients Receiving Chemotherapy  Today you received the following chemotherapy agents: Rituxan   To help prevent nausea and vomiting after your treatment, we encourage you to take your nausea medication.  Take it as often as prescribed.     If you develop nausea and vomiting that is not controlled by your nausea medication, call the clinic. If it is after clinic hours your family physician or the after hours number for the clinic or go to the Emergency Department.   BELOW ARE SYMPTOMS THAT SHOULD BE REPORTED IMMEDIATELY:  *FEVER GREATER THAN 100.5 F  *CHILLS WITH OR WITHOUT FEVER  NAUSEA AND VOMITING THAT IS NOT CONTROLLED WITH YOUR NAUSEA MEDICATION  *UNUSUAL SHORTNESS OF BREATH  *UNUSUAL BRUISING OR BLEEDING  TENDERNESS IN MOUTH AND THROAT WITH OR WITHOUT PRESENCE OF ULCERS  *URINARY PROBLEMS  *BOWEL PROBLEMS  UNUSUAL RASH Items with * indicate a potential emergency and should be followed up as soon as possible.  Feel free to call the clinic you have any questions or concerns. The clinic phone number is [phone].   I have been informed and understand all the instructions given to me. I know to contact the clinic, my physician, or go to the Emergency Department if any problems should occur. I do not have any questions at this time, but understand that I may call the clinic during office hours   should I have any questions or need assistance in obtaining follow up care.    __________________________________________  _____________  __________ Signature of Patient or Authorized Representative            Date                   Time    __________________________________________ Nurse's Signature    

## 2012-01-13 ENCOUNTER — Ambulatory Visit (HOSPITAL_COMMUNITY)
Admission: RE | Admit: 2012-01-13 | Discharge: 2012-01-13 | Disposition: A | Discharge status: Home / Self Care | Payer: 59 | Source: Ambulatory Visit | Attending: Physician Assistant | Admitting: Physician Assistant

## 2012-01-13 DIAGNOSIS — M47817 Spondylosis without myelopathy or radiculopathy, lumbosacral region: Secondary | ICD-10-CM | POA: Insufficient documentation

## 2012-01-13 DIAGNOSIS — M5137 Other intervertebral disc degeneration, lumbosacral region: Secondary | ICD-10-CM | POA: Insufficient documentation

## 2012-01-13 DIAGNOSIS — C8299 Follicular lymphoma, unspecified, extranodal and solid organ sites: Secondary | ICD-10-CM | POA: Insufficient documentation

## 2012-01-13 DIAGNOSIS — R599 Enlarged lymph nodes, unspecified: Secondary | ICD-10-CM | POA: Insufficient documentation

## 2012-01-13 DIAGNOSIS — M51379 Other intervertebral disc degeneration, lumbosacral region without mention of lumbar back pain or lower extremity pain: Secondary | ICD-10-CM | POA: Insufficient documentation

## 2012-01-13 DIAGNOSIS — C8210 Follicular lymphoma grade II, unspecified site: Secondary | ICD-10-CM

## 2012-01-13 DIAGNOSIS — M47814 Spondylosis without myelopathy or radiculopathy, thoracic region: Secondary | ICD-10-CM | POA: Insufficient documentation

## 2012-01-13 DIAGNOSIS — I708 Atherosclerosis of other arteries: Secondary | ICD-10-CM | POA: Insufficient documentation

## 2012-01-13 MED ORDER — IOHEXOL 300 MG/ML  SOLN
100.0000 mL | Freq: Once | INTRAMUSCULAR | Status: AC | PRN
  Administered 2012-01-13: 100 mL via INTRAVENOUS

## 2012-01-16 ENCOUNTER — Telehealth: Payer: Self-pay | Admitting: *Deleted

## 2012-01-16 ENCOUNTER — Other Ambulatory Visit (HOSPITAL_BASED_OUTPATIENT_CLINIC_OR_DEPARTMENT_OTHER): Payer: 59

## 2012-01-16 ENCOUNTER — Encounter: Payer: Self-pay | Admitting: Internal Medicine

## 2012-01-16 ENCOUNTER — Ambulatory Visit (HOSPITAL_BASED_OUTPATIENT_CLINIC_OR_DEPARTMENT_OTHER): Payer: 59 | Admitting: Internal Medicine

## 2012-01-16 ENCOUNTER — Ambulatory Visit (HOSPITAL_BASED_OUTPATIENT_CLINIC_OR_DEPARTMENT_OTHER): Payer: 59

## 2012-01-16 ENCOUNTER — Telehealth: Payer: Self-pay | Admitting: Internal Medicine

## 2012-01-16 VITALS — BP 146/65 | HR 60 | Temp 98.1°F | Resp 20

## 2012-01-16 VITALS — BP 143/83 | HR 70 | Temp 97.1°F | Resp 22 | Ht 70.0 in | Wt 342.7 lb

## 2012-01-16 DIAGNOSIS — C8299 Follicular lymphoma, unspecified, extranodal and solid organ sites: Secondary | ICD-10-CM

## 2012-01-16 DIAGNOSIS — Z5112 Encounter for antineoplastic immunotherapy: Secondary | ICD-10-CM

## 2012-01-16 DIAGNOSIS — C8290 Follicular lymphoma, unspecified, unspecified site: Secondary | ICD-10-CM

## 2012-01-16 DIAGNOSIS — C8210 Follicular lymphoma grade II, unspecified site: Secondary | ICD-10-CM

## 2012-01-16 DIAGNOSIS — M109 Gout, unspecified: Secondary | ICD-10-CM

## 2012-01-16 DIAGNOSIS — C8590 Non-Hodgkin lymphoma, unspecified, unspecified site: Secondary | ICD-10-CM

## 2012-01-16 HISTORY — DX: Gout, unspecified: M10.9

## 2012-01-16 LAB — CBC WITH DIFFERENTIAL/PLATELET
BASO%: 1.3 % (ref 0.0–2.0)
LYMPH%: 17 % (ref 14.0–49.0)
MCHC: 33.1 g/dL (ref 32.0–36.0)
MONO#: 0.7 10*3/uL (ref 0.1–0.9)
NEUT#: 5.7 10*3/uL (ref 1.5–6.5)
Platelets: 276 10*3/uL (ref 140–400)
RBC: 4.54 10*6/uL (ref 4.20–5.82)
RDW: 15.7 % — ABNORMAL HIGH (ref 11.0–14.6)
WBC: 8 10*3/uL (ref 4.0–10.3)
lymph#: 1.4 10*3/uL (ref 0.9–3.3)
nRBC: 0 % (ref 0–0)

## 2012-01-16 LAB — LACTATE DEHYDROGENASE (CC13): LDH: 255 U/L — ABNORMAL HIGH (ref 125–245)

## 2012-01-16 LAB — COMPREHENSIVE METABOLIC PANEL (CC13)
Alkaline Phosphatase: 133 U/L (ref 40–150)
BUN: 12 mg/dL (ref 7.0–26.0)
Creatinine: 0.9 mg/dL (ref 0.7–1.3)
Glucose: 100 mg/dl — ABNORMAL HIGH (ref 70–99)
Total Bilirubin: 0.31 mg/dL (ref 0.20–1.20)

## 2012-01-16 MED ORDER — SODIUM CHLORIDE 0.9 % IV SOLN
Freq: Once | INTRAVENOUS | Status: AC
  Administered 2012-01-16: 11:00:00 via INTRAVENOUS

## 2012-01-16 MED ORDER — DIPHENHYDRAMINE HCL 25 MG PO CAPS
50.0000 mg | ORAL_CAPSULE | Freq: Once | ORAL | Status: AC
  Administered 2012-01-16: 50 mg via ORAL

## 2012-01-16 MED ORDER — ACETAMINOPHEN 325 MG PO TABS
650.0000 mg | ORAL_TABLET | Freq: Once | ORAL | Status: AC
  Administered 2012-01-16: 650 mg via ORAL

## 2012-01-16 MED ORDER — SODIUM CHLORIDE 0.9 % IV SOLN
375.0000 mg/m2 | Freq: Once | INTRAVENOUS | Status: AC
  Administered 2012-01-16: 1000 mg via INTRAVENOUS
  Filled 2012-01-16: qty 100

## 2012-01-16 MED ORDER — SODIUM CHLORIDE 0.9 % IJ SOLN
10.0000 mL | INTRAMUSCULAR | Status: DC | PRN
  Administered 2012-01-16: 10 mL
  Filled 2012-01-16: qty 10

## 2012-01-16 MED ORDER — HEPARIN SOD (PORK) LOCK FLUSH 100 UNIT/ML IV SOLN
500.0000 [IU] | Freq: Once | INTRAVENOUS | Status: AC | PRN
  Administered 2012-01-16: 500 [IU]
  Filled 2012-01-16: qty 5

## 2012-01-16 NOTE — Progress Notes (Signed)
[facility] Telephone:(336) [phone]   Fax:(336) [phone]  OFFICE PROGRESS NOTE  [name] Baars, MD [address] [facility], [place]. [place] [address]  DIAGNOSIS: : Stage III follicular lymphoma, grade 2, diagnosed in June 2012   PRIOR THERAPY: Systemic chemotherapy with CHOP/Rituxan, status post 8 cycles, last dose was given 07/19/2010.   CURRENT THERAPY: Maintenance therapy with Rituxan 375 mg/m2 given every 2 months for a total of 2 years, status post 6 cycles.  INTERVAL HISTORY: [name] 57 y.o. male returns to the clinic today for routine two-month followup visit. The patient is feeling fine today with no specific complaints. He had a gout attack on the new year day. He was treated with Indocin by one of the urgent care center and has been much better. He denied having any significant weight loss or night sweats. He denied having any palpable lymphadenopathy. The patient denied having any significant chest pain, shortness breath, cough or hemoptysis. He is tolerating his maintenance therapy with Rituxan fairly well. The patient has repeat CT scan of the chest, abdomen and pelvis performed recently and he is here for evaluation and discussion of his scan results.  MEDICAL HISTORY: Past Medical History  Diagnosis Date  . Hypertension   . Venous insufficiency   . Dyslipidemia   . nhl dx'd 01/2010    ALLERGIES:  is allergic to penicillins.  MEDICATIONS:  Current Outpatient Prescriptions  Medication Sig Dispense Refill  . amLODipine (NORVASC) 5 MG tablet Take 5 mg by mouth daily.        [name] Kitchen BENICAR HCT 40-25 MG per tablet       . Cholecalciferol (VITAMIN D-3 PO) Take 1 tablet by mouth daily.        [name] Kitchen lidocaine-prilocaine (EMLA) cream Apply topically as needed.  30 g  1  . multivitamin-iron-minerals-folic acid (CENTRUM) chewable tablet Chew 1 tablet by mouth daily.      . Nebivolol HCl (BYSTOLIC PO) Take 0.5 tablets by mouth daily.          . Omega-3 Fatty Acids (FISH OIL PO) Take 1 tablet by mouth daily.        [name] Kitchen allopurinol (ZYLOPRIM) 100 MG tablet Take 100 mg by mouth daily.        [name] Kitchen HYDROcodone-acetaminophen (VICODIN) 5-500 MG per tablet Take 1 tablet by mouth every 6 (six) hours as needed.        . Ibuprofen (ADVIL) 200 MG CAPS Take by mouth as needed.        . indomethacin (INDOCIN) 50 MG capsule Take 50 mg by mouth Daily.        REVIEW OF SYSTEMS:  A comprehensive review of systems was negative.   PHYSICAL EXAMINATION: General appearance: alert, cooperative and no distress Head: Normocephalic, without obvious abnormality, atraumatic Neck: no adenopathy Lymph nodes: Cervical, supraclavicular, and axillary nodes normal. Resp: clear to auscultation bilaterally Cardio: regular rate and rhythm, S1, S2 normal, no murmur, click, rub or gallop GI: soft, non-tender; bowel sounds normal; no masses,  no organomegaly Extremities: extremities normal, atraumatic, no cyanosis or edema Neurologic: Alert and oriented X 3, normal strength and tone. Normal symmetric reflexes. Normal coordination and gait  ECOG PERFORMANCE STATUS: 0 - Asymptomatic  Blood pressure 143/83, pulse 70, temperature 97.1 F (36.2 C), temperature source Oral, resp. rate 22, height 5\' 10"  (1.778 m), weight 342 lb 11.2 oz (155.448 kg).  LABORATORY DATA: Lab Results  Component Value Date   WBC 8.0  01/16/2012   HGB 13.2 01/16/2012   HCT 39.9 01/16/2012   MCV 87.9 01/16/2012   PLT 276 01/16/2012      Chemistry      Component Value Date/Time   NA 138 11/21/2011 0836   NA 138 07/11/2011 1417   NA 138 05/16/2011 0858   K 4.1 11/21/2011 0836   K 4.2 07/11/2011 1417   K 4.3 05/16/2011 0858   CL 104 11/21/2011 0836   CL 96* 07/11/2011 1417   CL 102 05/16/2011 0858   CO2 26 11/21/2011 0836   CO2 26 07/11/2011 1417   CO2 22 05/16/2011 0858   BUN 17.0 11/21/2011 0836   BUN 18 07/11/2011 1417   BUN 22 05/16/2011 0858   CREATININE 0.9 11/21/2011 0836   CREATININE 1.1 07/11/2011 1417    CREATININE 1.04 05/16/2011 0858      Component Value Date/Time   CALCIUM 9.6 11/21/2011 0836   CALCIUM 8.4 07/11/2011 1417   CALCIUM 9.2 05/16/2011 0858   ALKPHOS 128 11/21/2011 0836   ALKPHOS 124* 07/11/2011 1417   ALKPHOS 119* 05/16/2011 0858   AST 21 11/21/2011 0836   AST 32 07/11/2011 1417   AST 23 05/16/2011 0858   ALT 28 11/21/2011 0836   ALT 28 05/16/2011 0858   BILITOT 0.50 11/21/2011 0836   BILITOT 0.50 07/11/2011 1417   BILITOT 0.4 05/16/2011 0858       RADIOGRAPHIC STUDIES: Ct Chest W Contrast  01/13/2012  *RADIOLOGY REPORT*  Clinical Data:  Restaging of follicular lymphoma.  CT CHEST, ABDOMEN AND PELVIS WITH CONTRAST  Technique:  Multidetector CT imaging of the chest, abdomen and pelvis was performed following the standard protocol during bolus administration of intravenous contrast.  Contrast: OMNIPAQUE IOHEXOL 300 MG/ML  SOLN  Comparison:  07/11/2011   CT CHEST  Findings:  Borderline prominent subcarinal lymph node at 1.5 cm in short axis, similar to prior.  No overtly pathologic thoracic adenopathy.  Left anterior descending coronary artery atherosclerotic calcification is present along with right coronary artery calcification.  Mild atherosclerosis of the aortic arch is present.  The lungs appear clear.  Moderate thoracic spondylosis noted.  IMPRESSION:  1.  No overt adenopathy to suggest recurrence, although the subcarinal lymph node is at the upper limits of normal in size. 2.  Thoracic spondylosis. The three atherosclerosis   CT ABDOMEN AND PELVIS  Findings:  Stable tiny hypodense lesion peripherally in the right hepatic lobe on image 58 of series 2.  The liver is otherwise unremarkable.  The spleen, pancreas, and adrenal glands appear normal.  The gallbladder is contracted.  The kidneys appear unremarkable, as do the proximal ureters.  No pathologic retroperitoneal or porta hepatis adenopathy is identified.  Aortoiliac atherosclerotic vascular disease noted.  A right external iliac  node measures 1.4 cm in short axis on image 105 of series 2.  Small pelvic sidewall lymph nodes are present.  Urinary bladder unremarkable.  No dilated bowel.  Lumbar spondylosis and degenerative disc disease noted with loss of intervertebral disc height that all levels, suspected disc osteophyte complexes particularly at L1-2, L2-3, and L3-4; and osseous foraminal stenosis affecting all levels in the lumbar spine bilaterally.  IMPRESSION:  1.  Mild right external iliac adenopathy.  However, this node has not been hypermetabolic on prior PET CT examinations despite its mildly enlarged size, and accordingly is likely benign. 2.  Considerable lumbar spondylosis and degenerative disc disease causing multilevel impingement.   Original Report Authenticated By: Gaylyn Rong, M.D.  ASSESSMENT: This is a very pleasant 57 years old white male with history of stage III follicular lymphoma currently on maintenance Rituxan status post 6 cycles. The patient is tolerating his treatment fairly well and he has no evidence for disease recurrence.  PLAN: I discussed the scan results with the patient today. I recommended for him to continue on maintenance treatment with Rituxan as scheduled. He will receive cycle #7 today.  He would come back for followup visit in 2 months with the start of cycle #8.  For the gout the patient was advised to see his primary care physician for consideration of starting allopurinol as prophylactic treatment. He was advised to call immediately if he has any concerning symptoms in the interval.  All questions were answered. The patient knows to call the clinic with any problems, questions or concerns. We can certainly see the patient much sooner if necessary.  I spent 15 minutes counseling the patient face to face. The total time spent in the appointment was 25 minutes.

## 2012-01-16 NOTE — Telephone Encounter (Signed)
appts made and printed for pt, pt aware that tx will follow md visit and email to [employer]

## 2012-01-16 NOTE — Patient Instructions (Addendum)
[facility] Discharge Instructions for Patients Receiving Chemotherapy  Today you received the following chemotherapy agents :  Rituxan.  To help prevent nausea and vomiting after your treatment, we encourage you to take your nausea medication as instructed by your physician.    If you develop nausea and vomiting that is not controlled by your nausea medication, call the clinic. If it is after clinic hours your family physician or the after hours number for the clinic or go to the Emergency Department.   BELOW ARE SYMPTOMS THAT SHOULD BE REPORTED IMMEDIATELY:  *FEVER GREATER THAN 100.5 F  *CHILLS WITH OR WITHOUT FEVER  NAUSEA AND VOMITING THAT IS NOT CONTROLLED WITH YOUR NAUSEA MEDICATION  *UNUSUAL SHORTNESS OF BREATH  *UNUSUAL BRUISING OR BLEEDING  TENDERNESS IN MOUTH AND THROAT WITH OR WITHOUT PRESENCE OF ULCERS  *URINARY PROBLEMS  *BOWEL PROBLEMS  UNUSUAL RASH Items with * indicate a potential emergency and should be followed up as soon as possible.  One of the nurses will contact you 24 hours after your treatment. Please let the nurse know about any problems that you may have experienced. Feel free to call the clinic you have any questions or concerns. The clinic phone number is [phone].   I have been informed and understand all the instructions given to me. I know to contact the clinic, my physician, or go to the Emergency Department if any problems should occur. I do not have any questions at this time, but understand that I may call the clinic during office hours   should I have any questions or need assistance in obtaining follow up care.    __________________________________________  _____________  __________ Signature of Patient or Authorized Representative            Date                   Time    __________________________________________ Nurse's Signature

## 2012-01-16 NOTE — Patient Instructions (Signed)
The scan showed no evidence for disease recurrence. Continue treatment with maintenance Rituxan. Followup in 2 months.

## 2012-01-16 NOTE — Telephone Encounter (Signed)
Per staff message and POF I have scheduled appts.  JMW  

## 2012-03-19 ENCOUNTER — Ambulatory Visit (HOSPITAL_BASED_OUTPATIENT_CLINIC_OR_DEPARTMENT_OTHER): Payer: 59 | Admitting: Physician Assistant

## 2012-03-19 ENCOUNTER — Encounter: Payer: Self-pay | Admitting: Physician Assistant

## 2012-03-19 ENCOUNTER — Telehealth: Payer: Self-pay | Admitting: Internal Medicine

## 2012-03-19 ENCOUNTER — Ambulatory Visit (HOSPITAL_BASED_OUTPATIENT_CLINIC_OR_DEPARTMENT_OTHER): Payer: 59

## 2012-03-19 ENCOUNTER — Other Ambulatory Visit (HOSPITAL_BASED_OUTPATIENT_CLINIC_OR_DEPARTMENT_OTHER): Payer: 59

## 2012-03-19 VITALS — BP 124/71 | HR 56 | Temp 97.5°F | Resp 18

## 2012-03-19 VITALS — BP 136/81 | HR 66 | Temp 97.0°F | Resp 20 | Ht 70.0 in | Wt 345.7 lb

## 2012-03-19 DIAGNOSIS — C8210 Follicular lymphoma grade II, unspecified site: Secondary | ICD-10-CM

## 2012-03-19 DIAGNOSIS — M109 Gout, unspecified: Secondary | ICD-10-CM

## 2012-03-19 DIAGNOSIS — C8590 Non-Hodgkin lymphoma, unspecified, unspecified site: Secondary | ICD-10-CM

## 2012-03-19 DIAGNOSIS — C8299 Follicular lymphoma, unspecified, extranodal and solid organ sites: Secondary | ICD-10-CM

## 2012-03-19 DIAGNOSIS — Z5112 Encounter for antineoplastic immunotherapy: Secondary | ICD-10-CM

## 2012-03-19 DIAGNOSIS — C8589 Other specified types of non-Hodgkin lymphoma, extranodal and solid organ sites: Secondary | ICD-10-CM

## 2012-03-19 LAB — CBC WITH DIFFERENTIAL/PLATELET
EOS%: 4.1 % (ref 0.0–7.0)
Eosinophils Absolute: 0.3 10*3/uL (ref 0.0–0.5)
LYMPH%: 15.1 % (ref 14.0–49.0)
MCH: 29.4 pg (ref 27.2–33.4)
MCV: 87.6 fL (ref 79.3–98.0)
MONO%: 10.2 % (ref 0.0–14.0)
NEUT#: 5.4 10*3/uL (ref 1.5–6.5)
Platelets: 219 10*3/uL (ref 140–400)
RBC: 4.36 10*6/uL (ref 4.20–5.82)
RDW: 15.3 % — ABNORMAL HIGH (ref 11.0–14.6)
nRBC: 0 % (ref 0–0)

## 2012-03-19 LAB — COMPREHENSIVE METABOLIC PANEL (CC13)
AST: 20 U/L (ref 5–34)
BUN: 16.6 mg/dL (ref 7.0–26.0)
CO2: 26 mEq/L (ref 22–29)
Calcium: 9.4 mg/dL (ref 8.4–10.4)
Chloride: 106 mEq/L (ref 98–107)
Creatinine: 0.9 mg/dL (ref 0.7–1.3)
Total Bilirubin: 0.29 mg/dL (ref 0.20–1.20)

## 2012-03-19 MED ORDER — SODIUM CHLORIDE 0.9 % IV SOLN
Freq: Once | INTRAVENOUS | Status: AC
  Administered 2012-03-19: 20 mL via INTRAVENOUS

## 2012-03-19 MED ORDER — SODIUM CHLORIDE 0.9 % IJ SOLN
10.0000 mL | INTRAMUSCULAR | Status: DC | PRN
  Administered 2012-03-19: 10 mL
  Filled 2012-03-19: qty 10

## 2012-03-19 MED ORDER — SODIUM CHLORIDE 0.9 % IV SOLN
375.0000 mg/m2 | Freq: Once | INTRAVENOUS | Status: AC
  Administered 2012-03-19 (×2): 1000 mg via INTRAVENOUS
  Filled 2012-03-19: qty 100

## 2012-03-19 MED ORDER — DIPHENHYDRAMINE HCL 25 MG PO CAPS
50.0000 mg | ORAL_CAPSULE | Freq: Once | ORAL | Status: AC
  Administered 2012-03-19: 50 mg via ORAL

## 2012-03-19 MED ORDER — ACETAMINOPHEN 325 MG PO TABS
650.0000 mg | ORAL_TABLET | Freq: Once | ORAL | Status: AC
  Administered 2012-03-19: 650 mg via ORAL

## 2012-03-19 MED ORDER — LIDOCAINE-PRILOCAINE 2.5-2.5 % EX CREA: TOPICAL_CREAM | CUTANEOUS | Status: DC | PRN

## 2012-03-19 MED ORDER — ALLOPURINOL 100 MG PO TABS: 100.0000 mg | ORAL_TABLET | Freq: Every day | ORAL | Status: DC

## 2012-03-19 MED ORDER — HEPARIN SOD (PORK) LOCK FLUSH 100 UNIT/ML IV SOLN
500.0000 [IU] | Freq: Once | INTRAVENOUS | Status: AC | PRN
  Administered 2012-03-19: 500 [IU]
  Filled 2012-03-19: qty 5

## 2012-03-19 NOTE — Patient Instructions (Addendum)
Follow up in 2 months

## 2012-03-19 NOTE — Progress Notes (Signed)
[facility] Telephone:([phone]   Fax:(336) [phone]  OFFICE PROGRESS NOTE  [name], MD [address] [facility], [place]. [place] [address]  DIAGNOSIS: : Stage III follicular lymphoma, grade 2, diagnosed in June 2012   PRIOR THERAPY: Systemic chemotherapy with CHOP/Rituxan, status post 8 cycles, last dose was given 07/19/2010.   CURRENT THERAPY: Maintenance therapy with Rituxan 375 mg/m2 given every 2 months for a total of 2 years, status post 7 cycles.  INTERVAL HISTORY: [name] 57 y.o. male returns to the clinic today for routine two-month followup visit. The patient is feeling fine today with no specific complaints. He requests a refill  For his allopurinol and for his Emla cream. He denied any further gout attacks of the one that he had around [place] Year's Day.  He denied having any significant weight loss or night sweats. He denied having any palpable lymphadenopathy. The patient denied having any significant chest pain, shortness breath, cough or hemoptysis. He is tolerating his maintenance therapy with Rituxan fairly well.   MEDICAL HISTORY: Past Medical History  Diagnosis Date  . Hypertension   . Venous insufficiency   . Dyslipidemia   . nhl dx'd 01/2010  . Gout attack 01/16/2012    ALLERGIES:  is allergic to penicillins.  MEDICATIONS:  Current Outpatient Prescriptions  Medication Sig Dispense Refill  . allopurinol (ZYLOPRIM) 100 MG tablet Take 1 tablet (100 mg total) by mouth daily.  90 tablet  1  . amLODipine (NORVASC) 5 MG tablet Take 5 mg by mouth daily.        [name] Kitchen BENICAR HCT 40-25 MG per tablet       . Cholecalciferol (VITAMIN D-3 PO) Take 1 tablet by mouth daily.        [name] Kitchen HYDROcodone-acetaminophen (VICODIN) 5-500 MG per tablet Take 1 tablet by mouth every 6 (six) hours as needed.        . Ibuprofen (ADVIL) 200 MG CAPS Take by mouth as needed.        . indomethacin (INDOCIN) 50 MG capsule Take 50 mg by mouth Daily.       [name] Kitchen lidocaine-prilocaine (EMLA) cream Apply topically as needed.  30 g  1  . multivitamin-iron-minerals-folic acid (CENTRUM) chewable tablet Chew 1 tablet by mouth daily.      . Nebivolol HCl (BYSTOLIC PO) Take 0.5 tablets by mouth daily.        . Omega-3 Fatty Acids (FISH OIL PO) Take 1 tablet by mouth daily.         No current facility-administered medications for this visit.    REVIEW OF SYSTEMS:  A comprehensive review of systems was negative.   PHYSICAL EXAMINATION: General appearance: alert, cooperative and no distress Head: Normocephalic, without obvious abnormality, atraumatic Neck: no adenopathy Lymph nodes: Cervical, supraclavicular, and axillary nodes normal. Resp: clear to auscultation bilaterally Cardio: regular rate and rhythm, S1, S2 normal, no murmur, click, rub or gallop GI: soft, non-tender; bowel sounds normal; no masses,  no organomegaly Extremities: extremities normal, atraumatic, no cyanosis or edema Neurologic: Alert and oriented X 3, normal strength and tone. Normal symmetric reflexes. Normal coordination and gait  ECOG PERFORMANCE STATUS: 0 - Asymptomatic  Blood pressure 136/81, pulse 66, temperature 97 F (36.1 C), temperature source Oral, resp. rate 20, height 5\' 10"  (1.778 m), weight 345 lb 11.2 oz (156.808 kg).  LABORATORY DATA: Lab Results  Component Value Date   WBC 7.7 03/19/2012   HGB 12.8* 03/19/2012  HCT 38.2* 03/19/2012   MCV 87.6 03/19/2012   PLT 219 03/19/2012      Chemistry      Component Value Date/Time   NA 138 01/16/2012 0838   NA 138 07/11/2011 1417   NA 138 05/16/2011 0858   K 3.9 01/16/2012 0838   K 4.2 07/11/2011 1417   K 4.3 05/16/2011 0858   CL 103 01/16/2012 0838   CL 96* 07/11/2011 1417   CL 102 05/16/2011 0858   CO2 26 01/16/2012 0838   CO2 26 07/11/2011 1417   CO2 22 05/16/2011 0858   BUN 12.0 01/16/2012 0838   BUN 18 07/11/2011 1417   BUN 22 05/16/2011 0858   CREATININE 0.9 01/16/2012 0838   CREATININE 1.1 07/11/2011 1417   CREATININE 1.04  05/16/2011 0858      Component Value Date/Time   CALCIUM 9.6 01/16/2012 0838   CALCIUM 8.4 07/11/2011 1417   CALCIUM 9.2 05/16/2011 0858   ALKPHOS 133 01/16/2012 0838   ALKPHOS 124* 07/11/2011 1417   ALKPHOS 119* 05/16/2011 0858   AST 20 01/16/2012 0838   AST 32 07/11/2011 1417   AST 23 05/16/2011 0858   ALT 31 01/16/2012 0838   ALT 28 05/16/2011 0858   BILITOT 0.31 01/16/2012 0838   BILITOT 0.50 07/11/2011 1417   BILITOT 0.4 05/16/2011 0858       RADIOGRAPHIC STUDIES: Ct Chest W Contrast  01/13/2012  *RADIOLOGY REPORT*  Clinical Data:  Restaging of follicular lymphoma.  CT CHEST, ABDOMEN AND PELVIS WITH CONTRAST  Technique:  Multidetector CT imaging of the chest, abdomen and pelvis was performed following the standard protocol during bolus administration of intravenous contrast.  Contrast: OMNIPAQUE IOHEXOL 300 MG/ML  SOLN  Comparison:  07/11/2011   CT CHEST  Findings:  Borderline prominent subcarinal lymph node at 1.5 cm in short axis, similar to prior.  No overtly pathologic thoracic adenopathy.  Left anterior descending coronary artery atherosclerotic calcification is present along with right coronary artery calcification.  Mild atherosclerosis of the aortic arch is present.  The lungs appear clear.  Moderate thoracic spondylosis noted.  IMPRESSION:  1.  No overt adenopathy to suggest recurrence, although the subcarinal lymph node is at the upper limits of normal in size. 2.  Thoracic spondylosis. The three atherosclerosis   CT ABDOMEN AND PELVIS  Findings:  Stable tiny hypodense lesion peripherally in the right hepatic lobe on image 58 of series 2.  The liver is otherwise unremarkable.  The spleen, pancreas, and adrenal glands appear normal.  The gallbladder is contracted.  The kidneys appear unremarkable, as do the proximal ureters.  No pathologic retroperitoneal or porta hepatis adenopathy is identified.  Aortoiliac atherosclerotic vascular disease noted.  A right external iliac node measures 1.4 cm in short  axis on image 105 of series 2.  Small pelvic sidewall lymph nodes are present.  Urinary bladder unremarkable.  No dilated bowel.  Lumbar spondylosis and degenerative disc disease noted with loss of intervertebral disc height that all levels, suspected disc osteophyte complexes particularly at L1-2, L2-3, and L3-4; and osseous foraminal stenosis affecting all levels in the lumbar spine bilaterally.  IMPRESSION:  1.  Mild right external iliac adenopathy.  However, this node has not been hypermetabolic on prior PET CT examinations despite its mildly enlarged size, and accordingly is likely benign. 2.  Considerable lumbar spondylosis and degenerative disc disease causing multilevel impingement.   Original Report Authenticated By: Gaylyn Rong, M.D.    ASSESSMENT/PLAN: This is a very pleasant  57 years old white male with history of stage III follicular lymphoma currently on maintenance Rituxan status post 6 cycles. The patient is tolerating his treatment fairly well and he has no evidence for disease recurrence. Patient was discussed with Dr. Arbutus Ped. He will proceed with cycle #8 of his maintenance treatment with Rituxan. Refill prescriptions for his allopurinol and EMLA cream were sent to his pharmacy of record via E. scribed. He'll return in 2 months prior to cycle #9 with a repeat CBC differential, C. met and LDH.  JOHNSON, ADRENA E, PA-C   He was advised to call immediately if he has any concerning symptoms in the interval.  All questions were answered. The patient knows to call the clinic with any problems, questions or concerns. We can certainly see the patient much sooner if necessary.  I spent 20  minutes counseling the patient face to face. The total time spent in the appointment was 30 minutes.

## 2012-03-19 NOTE — Telephone Encounter (Signed)
Gave pt appt for lab and MD, waiting for Melissa to put in chemo for May 2014 appts

## 2012-04-12 ENCOUNTER — Telehealth: Payer: Self-pay | Admitting: Internal Medicine

## 2012-04-12 NOTE — Telephone Encounter (Signed)
Moved 5/5 f/u appt from AJ to MM per AJ due to AJ on PAL and pt has tx. Pt moved to MM and start time has not changed.

## 2012-05-14 ENCOUNTER — Telehealth: Payer: Self-pay | Admitting: Internal Medicine

## 2012-05-14 ENCOUNTER — Encounter: Payer: Self-pay | Admitting: Internal Medicine

## 2012-05-14 ENCOUNTER — Ambulatory Visit (HOSPITAL_BASED_OUTPATIENT_CLINIC_OR_DEPARTMENT_OTHER): Payer: 59

## 2012-05-14 ENCOUNTER — Ambulatory Visit (HOSPITAL_BASED_OUTPATIENT_CLINIC_OR_DEPARTMENT_OTHER): Payer: 59 | Admitting: Internal Medicine

## 2012-05-14 ENCOUNTER — Other Ambulatory Visit (HOSPITAL_BASED_OUTPATIENT_CLINIC_OR_DEPARTMENT_OTHER): Payer: 59 | Admitting: Lab

## 2012-05-14 VITALS — BP 112/65 | HR 62 | Temp 97.0°F | Resp 17 | Ht 70.0 in | Wt 343.6 lb

## 2012-05-14 VITALS — BP 115/67 | HR 57 | Temp 97.1°F | Resp 18

## 2012-05-14 DIAGNOSIS — C8590 Non-Hodgkin lymphoma, unspecified, unspecified site: Secondary | ICD-10-CM

## 2012-05-14 DIAGNOSIS — C8299 Follicular lymphoma, unspecified, extranodal and solid organ sites: Secondary | ICD-10-CM

## 2012-05-14 DIAGNOSIS — Z5112 Encounter for antineoplastic immunotherapy: Secondary | ICD-10-CM

## 2012-05-14 DIAGNOSIS — C8210 Follicular lymphoma grade II, unspecified site: Secondary | ICD-10-CM

## 2012-05-14 LAB — COMPREHENSIVE METABOLIC PANEL (CC13)
Albumin: 3.5 g/dL (ref 3.5–5.0)
BUN: 20.9 mg/dL (ref 7.0–26.0)
CO2: 24 mEq/L (ref 22–29)
Calcium: 9.7 mg/dL (ref 8.4–10.4)
Chloride: 105 mEq/L (ref 98–107)
Creatinine: 0.9 mg/dL (ref 0.7–1.3)
Glucose: 102 mg/dl — ABNORMAL HIGH (ref 70–99)
Potassium: 4.1 mEq/L (ref 3.5–5.1)

## 2012-05-14 LAB — CBC WITH DIFFERENTIAL/PLATELET
BASO%: 1 % (ref 0.0–2.0)
Basophils Absolute: 0.1 10*3/uL (ref 0.0–0.1)
Eosinophils Absolute: 0.4 10*3/uL (ref 0.0–0.5)
HCT: 40.6 % (ref 38.4–49.9)
HGB: 13.4 g/dL (ref 13.0–17.1)
LYMPH%: 15.3 % (ref 14.0–49.0)
MCHC: 33 g/dL (ref 32.0–36.0)
MONO#: 0.8 10*3/uL (ref 0.1–0.9)
NEUT%: 69.6 % (ref 39.0–75.0)
Platelets: 203 10*3/uL (ref 140–400)
WBC: 8.6 10*3/uL (ref 4.0–10.3)
lymph#: 1.3 10*3/uL (ref 0.9–3.3)

## 2012-05-14 MED ORDER — RITUXIMAB CHEMO INJECTION 10 MG/ML
375.0000 mg/m2 | Freq: Once | INTRAVENOUS | Status: AC
  Administered 2012-05-14: 1000 mg via INTRAVENOUS
  Filled 2012-05-14: qty 100

## 2012-05-14 MED ORDER — SODIUM CHLORIDE 0.9 % IV SOLN
Freq: Once | INTRAVENOUS | Status: AC
  Administered 2012-05-14: 10:00:00 via INTRAVENOUS

## 2012-05-14 MED ORDER — SODIUM CHLORIDE 0.9 % IJ SOLN
10.0000 mL | INTRAMUSCULAR | Status: DC | PRN
  Administered 2012-05-14: 10 mL
  Filled 2012-05-14: qty 10

## 2012-05-14 MED ORDER — HEPARIN SOD (PORK) LOCK FLUSH 100 UNIT/ML IV SOLN
500.0000 [IU] | Freq: Once | INTRAVENOUS | Status: AC | PRN
  Administered 2012-05-14: 500 [IU]
  Filled 2012-05-14: qty 5

## 2012-05-14 MED ORDER — ACETAMINOPHEN 325 MG PO TABS
650.0000 mg | ORAL_TABLET | Freq: Once | ORAL | Status: AC
  Administered 2012-05-14: 650 mg via ORAL

## 2012-05-14 MED ORDER — DIPHENHYDRAMINE HCL 25 MG PO CAPS
50.0000 mg | ORAL_CAPSULE | Freq: Once | ORAL | Status: AC
  Administered 2012-05-14: 50 mg via ORAL

## 2012-05-14 NOTE — Patient Instructions (Signed)
[facility] Discharge Instructions for Patients Receiving Chemotherapy  Today you received the following chemotherapy agents Rituxan   To help prevent nausea and vomiting after your treatment, we encourage you to take your nausea medication as prescribed If you develop nausea and vomiting that is not controlled by your nausea medication, call the clinic. If it is after clinic hours your family physician or the after hours number for the clinic or go to the Emergency Department.   BELOW ARE SYMPTOMS THAT SHOULD BE REPORTED IMMEDIATELY:  *FEVER GREATER THAN 100.5 F  *CHILLS WITH OR WITHOUT FEVER  NAUSEA AND VOMITING THAT IS NOT CONTROLLED WITH YOUR NAUSEA MEDICATION  *UNUSUAL SHORTNESS OF BREATH  *UNUSUAL BRUISING OR BLEEDING  TENDERNESS IN MOUTH AND THROAT WITH OR WITHOUT PRESENCE OF ULCERS  *URINARY PROBLEMS  *BOWEL PROBLEMS  UNUSUAL RASH Items with * indicate a potential emergency and should be followed up as soon as possible.  One of the nurses will contact you 24 hours after your treatment. Please let the nurse know about any problems that you may have experienced. Feel free to call the clinic you have any questions or concerns. The clinic phone number is [phone].   I have been informed and understand all the instructions given to me. I know to contact the clinic, my physician, or go to the Emergency Department if any problems should occur. I do not have any questions at this time, but understand that I may call the clinic during office hours   should I have any questions or need assistance in obtaining follow up care.    __________________________________________  _____________  __________ Signature of Patient or Authorized Representative            Date                   Time    __________________________________________ Nurse's Signature    

## 2012-05-14 NOTE — Progress Notes (Signed)
[facility] [phone]   Fax:([phone]  OFFICE PROGRESS NOTE  [name], MD [address] [facility], [place]. [place] [address]  DIAGNOSIS: : Stage III follicular lymphoma, grade 2, diagnosed in June 2012   PRIOR THERAPY: Systemic chemotherapy with CHOP/Rituxan, status post 8 cycles, last dose was given 07/19/2010.   CURRENT THERAPY: Maintenance therapy with Rituxan 375 mg/m2 given every 2 months for a total of 2 years, status post 8 cycles.  INTERVAL HISTORY: [name] 57 y.o. male returns to the clinic today for routine two-month followup visit. The patient is feeling fine today with no specific complaints. He denied having any significant chest pain, shortness breath, cough or hemoptysis. He has no weight loss or night sweats. He has no nausea or vomiting. The patient is here today to start cycle #9 of his maintenance Rituxan.   MEDICAL HISTORY: Past Medical History  Diagnosis Date  . Hypertension   . Venous insufficiency   . Dyslipidemia   . nhl dx'd 01/2010  . Gout attack 01/16/2012    ALLERGIES:  is allergic to penicillins.  MEDICATIONS:  Current Outpatient Prescriptions  Medication Sig Dispense Refill  . allopurinol (ZYLOPRIM) 100 MG tablet Take 1 tablet (100 mg total) by mouth daily.  90 tablet  1  . amLODipine (NORVASC) 5 MG tablet Take 5 mg by mouth daily.        [name] Kitchen BENICAR HCT 40-25 MG per tablet       . Cholecalciferol (VITAMIN D-3 PO) Take 1 tablet by mouth daily.        [name] Kitchen HYDROcodone-acetaminophen (VICODIN) 5-500 MG per tablet Take 1 tablet by mouth every 6 (six) hours as needed.        . Ibuprofen (ADVIL) 200 MG CAPS Take by mouth as needed.        . indomethacin (INDOCIN) 50 MG capsule Take 50 mg by mouth Daily.      [name] Kitchen lidocaine-prilocaine (EMLA) cream Apply topically as needed.  30 g  1  . multivitamin-iron-minerals-folic acid (CENTRUM) chewable tablet Chew 1 tablet by mouth daily.      .  Nebivolol HCl (BYSTOLIC PO) Take 0.5 tablets by mouth daily.        . Omega-3 Fatty Acids (FISH OIL PO) Take 1 tablet by mouth daily.         No current facility-administered medications for this visit.    REVIEW OF SYSTEMS:  A comprehensive review of systems was negative.   PHYSICAL EXAMINATION: General appearance: alert, cooperative and no distress Head: Normocephalic, without obvious abnormality, atraumatic Neck: no adenopathy Lymph nodes: Cervical, supraclavicular, and axillary nodes normal. Resp: clear to auscultation bilaterally Cardio: regular rate and rhythm, S1, S2 normal, no murmur, click, rub or gallop GI: soft, non-tender; bowel sounds normal; no masses,  no organomegaly Extremities: extremities normal, atraumatic, no cyanosis or edema Neurologic: Alert and oriented X 3, normal strength and tone. Normal symmetric reflexes. Normal coordination and gait  ECOG PERFORMANCE STATUS: 0 - Asymptomatic  Blood pressure 112/65, pulse 62, temperature 97 F (36.1 C), temperature source Oral, resp. rate 17, height 5\' 10"  (1.778 m), weight 343 lb 9.6 oz (155.856 kg).  LABORATORY DATA: Lab Results  Component Value Date   WBC 8.6 05/14/2012   HGB 13.4 05/14/2012   HCT 40.6 05/14/2012   MCV 89.2 05/14/2012   PLT 203 05/14/2012      Chemistry      Component Value Date/Time  NA 140 03/19/2012 0843   NA 138 07/11/2011 1417   NA 138 05/16/2011 0858   K 4.0 03/19/2012 0843   K 4.2 07/11/2011 1417   K 4.3 05/16/2011 0858   CL 106 03/19/2012 0843   CL 96* 07/11/2011 1417   CL 102 05/16/2011 0858   CO2 26 03/19/2012 0843   CO2 26 07/11/2011 1417   CO2 22 05/16/2011 0858   BUN 16.6 03/19/2012 0843   BUN 18 07/11/2011 1417   BUN 22 05/16/2011 0858   CREATININE 0.9 03/19/2012 0843   CREATININE 1.1 07/11/2011 1417   CREATININE 1.04 05/16/2011 0858      Component Value Date/Time   CALCIUM 9.4 03/19/2012 0843   CALCIUM 8.4 07/11/2011 1417   CALCIUM 9.2 05/16/2011 0858   ALKPHOS 114 03/19/2012 0843   ALKPHOS 124* 07/11/2011  1417   ALKPHOS 119* 05/16/2011 0858   AST 20 03/19/2012 0843   AST 32 07/11/2011 1417   AST 23 05/16/2011 0858   ALT 27 03/19/2012 0843   ALT 28 05/16/2011 0858   BILITOT 0.29 03/19/2012 0843   BILITOT 0.50 07/11/2011 1417   BILITOT 0.4 05/16/2011 0858       RADIOGRAPHIC STUDIES: No results found.  ASSESSMENT: This is a very pleasant 57 years old white male with history of stage III follicular lymphoma status post systemic chemotherapy with CHOP/Rituxan and currently on maintenance therapy with Rituxan every 2 months status post 8 cycles. The patient is doing fine today with no specific complaints.   PLAN: We'll proceed with cycle #9 today as scheduled. The patient would come back for followup visit in 2 months with repeat CT scan of the chest, abdomen and pelvis for restaging of his disease. He was advised to call immediately she has any concerning symptoms in the interval.  All questions were answered. The patient knows to call the clinic with any problems, questions or concerns. We can certainly see the patient much sooner if necessary.  I spent 15 minutes counseling the patient face to face. The total time spent in the appointment was 25 minutes.

## 2012-05-14 NOTE — Patient Instructions (Signed)
Continue maintenance treatment with Rituxan.  Followup visit in 2 months with repeat CT scan of the chest, abdomen and pelvis.

## 2012-06-15 ENCOUNTER — Encounter: Payer: Self-pay | Admitting: Internal Medicine

## 2012-06-15 NOTE — Progress Notes (Signed)
Mailed claim information to [employer] [address] [place], [place] [address]. Forward letter along with authorization to disclose health information signed by Mr. Riviello to Medical Records.

## 2012-07-16 ENCOUNTER — Ambulatory Visit (HOSPITAL_COMMUNITY)
Admission: RE | Admit: 2012-07-16 | Discharge: 2012-07-16 | Disposition: A | Discharge status: Home / Self Care | Payer: 59 | Source: Ambulatory Visit | Attending: Internal Medicine | Admitting: Internal Medicine

## 2012-07-16 ENCOUNTER — Other Ambulatory Visit (HOSPITAL_BASED_OUTPATIENT_CLINIC_OR_DEPARTMENT_OTHER): Payer: 59 | Admitting: Lab

## 2012-07-16 DIAGNOSIS — C8299 Follicular lymphoma, unspecified, extranodal and solid organ sites: Secondary | ICD-10-CM | POA: Insufficient documentation

## 2012-07-16 DIAGNOSIS — C8210 Follicular lymphoma grade II, unspecified site: Secondary | ICD-10-CM

## 2012-07-16 DIAGNOSIS — I700 Atherosclerosis of aorta: Secondary | ICD-10-CM | POA: Insufficient documentation

## 2012-07-16 DIAGNOSIS — C8589 Other specified types of non-Hodgkin lymphoma, extranodal and solid organ sites: Secondary | ICD-10-CM

## 2012-07-16 DIAGNOSIS — I2510 Atherosclerotic heart disease of native coronary artery without angina pectoris: Secondary | ICD-10-CM | POA: Insufficient documentation

## 2012-07-16 DIAGNOSIS — C8590 Non-Hodgkin lymphoma, unspecified, unspecified site: Secondary | ICD-10-CM

## 2012-07-16 LAB — CBC WITH DIFFERENTIAL/PLATELET
Basophils Absolute: 0.1 10*3/uL (ref 0.0–0.1)
Eosinophils Absolute: 0.4 10*3/uL (ref 0.0–0.5)
HCT: 37.8 % — ABNORMAL LOW (ref 38.4–49.9)
HGB: 12.7 g/dL — ABNORMAL LOW (ref 13.0–17.1)
NEUT#: 5.7 10*3/uL (ref 1.5–6.5)
NEUT%: 63.4 % (ref 39.0–75.0)
RDW: 16.2 % — ABNORMAL HIGH (ref 11.0–14.6)
lymph#: 1.8 10*3/uL (ref 0.9–3.3)

## 2012-07-16 LAB — COMPREHENSIVE METABOLIC PANEL (CC13)
Albumin: 3.5 g/dL (ref 3.5–5.0)
CO2: 27 mEq/L (ref 22–29)
Chloride: 103 mEq/L (ref 98–109)
Glucose: 92 mg/dl (ref 70–140)
Potassium: 4.3 mEq/L (ref 3.5–5.1)
Sodium: 138 mEq/L (ref 136–145)
Total Protein: 6.7 g/dL (ref 6.4–8.3)

## 2012-07-16 MED ORDER — IOHEXOL 300 MG/ML  SOLN
100.0000 mL | Freq: Once | INTRAMUSCULAR | Status: AC | PRN
  Administered 2012-07-16: 100 mL via INTRAVENOUS

## 2012-07-23 ENCOUNTER — Ambulatory Visit (HOSPITAL_BASED_OUTPATIENT_CLINIC_OR_DEPARTMENT_OTHER): Payer: 59 | Admitting: Internal Medicine

## 2012-07-23 ENCOUNTER — Telehealth: Payer: Self-pay | Admitting: Internal Medicine

## 2012-07-23 ENCOUNTER — Telehealth: Payer: Self-pay | Admitting: *Deleted

## 2012-07-23 ENCOUNTER — Ambulatory Visit (HOSPITAL_BASED_OUTPATIENT_CLINIC_OR_DEPARTMENT_OTHER): Payer: 59

## 2012-07-23 ENCOUNTER — Encounter: Payer: Self-pay | Admitting: Internal Medicine

## 2012-07-23 VITALS — BP 134/79 | HR 63 | Temp 96.8°F | Resp 18 | Ht 70.0 in | Wt 347.8 lb

## 2012-07-23 VITALS — BP 117/74 | HR 57 | Temp 96.7°F | Resp 18

## 2012-07-23 DIAGNOSIS — C8210 Follicular lymphoma grade II, unspecified site: Secondary | ICD-10-CM

## 2012-07-23 DIAGNOSIS — C8299 Follicular lymphoma, unspecified, extranodal and solid organ sites: Secondary | ICD-10-CM

## 2012-07-23 DIAGNOSIS — Z5112 Encounter for antineoplastic immunotherapy: Secondary | ICD-10-CM

## 2012-07-23 MED ORDER — DIPHENHYDRAMINE HCL 25 MG PO CAPS
50.0000 mg | ORAL_CAPSULE | Freq: Once | ORAL | Status: AC
  Administered 2012-07-23: 50 mg via ORAL

## 2012-07-23 MED ORDER — SODIUM CHLORIDE 0.9 % IV SOLN
375.0000 mg/m2 | Freq: Once | INTRAVENOUS | Status: AC
  Administered 2012-07-23: 1000 mg via INTRAVENOUS
  Filled 2012-07-23: qty 100

## 2012-07-23 MED ORDER — ACETAMINOPHEN 325 MG PO TABS
650.0000 mg | ORAL_TABLET | Freq: Once | ORAL | Status: AC
  Administered 2012-07-23: 650 mg via ORAL

## 2012-07-23 NOTE — Patient Instructions (Addendum)
[facility] Discharge Instructions for Patients Receiving Chemotherapy  Today you received the following chemotherapy agents: Rituxan. To help prevent nausea and vomiting after your treatment, we encourage you to take your nausea medication.   If you develop nausea and vomiting that is not controlled by your nausea medication, call the clinic.   BELOW ARE SYMPTOMS THAT SHOULD BE REPORTED IMMEDIATELY:  *FEVER GREATER THAN 100.5 F  *CHILLS WITH OR WITHOUT FEVER  NAUSEA AND VOMITING THAT IS NOT CONTROLLED WITH YOUR NAUSEA MEDICATION  *UNUSUAL SHORTNESS OF BREATH  *UNUSUAL BRUISING OR BLEEDING  TENDERNESS IN MOUTH AND THROAT WITH OR WITHOUT PRESENCE OF ULCERS  *URINARY PROBLEMS  *BOWEL PROBLEMS  UNUSUAL RASH Items with * indicate a potential emergency and should be followed up as soon as possible.  Feel free to call the clinic you have any questions or concerns. The clinic phone number is [phone].    

## 2012-07-23 NOTE — Telephone Encounter (Signed)
gv and printed appt sched and avs for pt....MW added tx   °

## 2012-07-23 NOTE — Progress Notes (Signed)
[facility] Telephone:([phone]   Fax:([phone]  OFFICE PROGRESS NOTE  [name], MD [address] [facility], [place]. [place] [address]  DIAGNOSIS: : Stage III follicular lymphoma, grade 2, diagnosed in June 2012   PRIOR THERAPY: Systemic chemotherapy with CHOP/Rituxan, status post 8 cycles, last dose was given 07/19/2010.   CURRENT THERAPY: Maintenance therapy with Rituxan 375 mg/m2 given every 2 months for a total of 2 years, status post 9 cycles.  INTERVAL HISTORY: [name] 57 y.o. male returns to the clinic today for two-month followup visit the patient is feeling fine today with no specific complaints. He denied having any significant weight loss or night sweats. He denied having any chest pain, shortness breath, cough or hemoptysis. He denied having any palpable lymphadenopathy. The patient is tolerating his treatment with maintenance Rituxan fairly well. He had repeat CT scan of the chest, abdomen and pelvis performed recently and he is here for evaluation and discussion of his scan results.   MEDICAL HISTORY: Past Medical History  Diagnosis Date  . Hypertension   . Venous insufficiency   . Dyslipidemia   . nhl dx'd 01/2010  . Gout attack 01/16/2012    ALLERGIES:  is allergic to penicillins.  MEDICATIONS:  Current Outpatient Prescriptions  Medication Sig Dispense Refill  . allopurinol (ZYLOPRIM) 100 MG tablet Take 1 tablet (100 mg total) by mouth daily.  90 tablet  1  . amLODipine (NORVASC) 5 MG tablet Take 5 mg by mouth daily.        [name] Kitchen BENICAR HCT 40-25 MG per tablet       . Cholecalciferol (VITAMIN D-3 PO) Take 1 tablet by mouth daily.        [name] Kitchen HYDROcodone-acetaminophen (VICODIN) 5-500 MG per tablet Take 1 tablet by mouth every 6 (six) hours as needed.        . Ibuprofen (ADVIL) 200 MG CAPS Take by mouth as needed.        . indomethacin (INDOCIN) 50 MG capsule Take 50 mg by mouth Daily.      [name] Kitchen  lidocaine-prilocaine (EMLA) cream Apply topically as needed.  30 g  1  . multivitamin-iron-minerals-folic acid (CENTRUM) chewable tablet Chew 1 tablet by mouth daily.      . Nebivolol HCl (BYSTOLIC PO) Take 0.5 tablets by mouth daily.        . Omega-3 Fatty Acids (FISH OIL PO) Take 1 tablet by mouth daily.         No current facility-administered medications for this visit.    REVIEW OF SYSTEMS:  A comprehensive review of systems was negative.   PHYSICAL EXAMINATION: General appearance: alert, cooperative and no distress Head: Normocephalic, without obvious abnormality, atraumatic Neck: no adenopathy Lymph nodes: Cervical, supraclavicular, and axillary nodes normal. Resp: clear to auscultation bilaterally Cardio: regular rate and rhythm, S1, S2 normal, no murmur, click, rub or gallop GI: soft, non-tender; bowel sounds normal; no masses,  no organomegaly Extremities: extremities normal, atraumatic, no cyanosis or edema Neurologic: Alert and oriented X 3, normal strength and tone. Normal symmetric reflexes. Normal coordination and gait  ECOG PERFORMANCE STATUS: 0 - Asymptomatic  There were no vitals taken for this visit.  LABORATORY DATA: Lab Results  Component Value Date   WBC 9.0 07/16/2012   HGB 12.7* 07/16/2012   HCT 37.8* 07/16/2012   MCV 89.8 07/16/2012   PLT 241 07/16/2012      Chemistry      Component Value  Date/Time   NA 138 07/16/2012 1524   NA 138 07/11/2011 1417   NA 138 05/16/2011 0858   K 4.3 07/16/2012 1524   K 4.2 07/11/2011 1417   K 4.3 05/16/2011 0858   CL 105 05/14/2012 0840   CL 96* 07/11/2011 1417   CL 102 05/16/2011 0858   CO2 27 07/16/2012 1524   CO2 26 07/11/2011 1417   CO2 22 05/16/2011 0858   BUN 20.6 07/16/2012 1524   BUN 18 07/11/2011 1417   BUN 22 05/16/2011 0858   CREATININE 1.1 07/16/2012 1524   CREATININE 1.1 07/11/2011 1417   CREATININE 1.04 05/16/2011 0858      Component Value Date/Time   CALCIUM 10.4 07/16/2012 1524   CALCIUM 8.4 07/11/2011 1417   CALCIUM 9.2 05/16/2011 0858     ALKPHOS 120 07/16/2012 1524   ALKPHOS 124* 07/11/2011 1417   ALKPHOS 119* 05/16/2011 0858   AST 21 07/16/2012 1524   AST 32 07/11/2011 1417   AST 23 05/16/2011 0858   ALT 23 07/16/2012 1524   ALT 28 05/16/2011 0858   BILITOT 0.33 07/16/2012 1524   BILITOT 0.50 07/11/2011 1417   BILITOT 0.4 05/16/2011 0858       RADIOGRAPHIC STUDIES: Ct Chest W Contrast  07/16/2012   *RADIOLOGY REPORT*  Clinical Data:  Non-Hodgkins lymphoma.  CT CHEST, ABDOMEN AND PELVIS WITH CONTRAST  Technique:  Multidetector CT imaging of the chest, abdomen and pelvis was performed following the standard protocol during bolus administration of intravenous contrast.  Contrast: OMNIPAQUE IOHEXOL 300 MG/ML  SOLN  Comparison:  01/13/2012    CT CHEST  Findings:  The chest wall is unremarkable and stable.  A left-sided Port-A-Cath is noted.  No supraclavicular or axillary lymphadenopathy.  The thyroid gland appears normal and stable.  The bony thorax is intact.  The upper posterior ribs have a somewhat moth-eaten appearance.  This appears relatively stable when compared the prior CT scan and PET CT.  It is possible this represents a fibrous dysplasia or other metabolic bone disease process.  I do not see any definite destructive bony lesions or spinal canal compromise.  Moderate degenerative changes involving the spine.  The heart is normal in size.  No pericardial effusion.  No mediastinal or hilar lymphadenopathy.  Small scattered lymph nodes are stable.  The aorta is normal in caliber.  No dissection.  Dense coronary artery calcifications are noted.  The esophagus is grossly normal.  Examination of the lung parenchyma demonstrates no acute pulmonary findings.  No worrisome pulmonary nodules or masses.  No pleural effusion.  IMPRESSION:  1.  No CT findings for adenopathy involving the chest. 2.  Somewhat moth-eaten appearance of the upper posterior ribs. This could be a benign process but cannot exclude lymphoma in the bone or treated lymphoma.   Recommend continued observation. 3.  No acute pulmonary findings.    CT ABDOMEN AND PELVIS  Findings:  The solid abdominal organs are unremarkable and stable. No focal lesions and no splenomegaly.  The gallbladder is normal. No common bile duct dilatation.  The stomach, duodenum, small bowel and colon are unremarkable.  The appendix is normal.  No mesenteric or retroperitoneal mass or adenopathy.  The aorta demonstrates advanced atherosclerotic calcifications but no aneurysm or dissection.  The bladder, prostate gland and seminal vesicles are unremarkable. No pelvic mass or adenopathy.  No free pelvic fluid collections.  A few small scattered lymph nodes are stable.  The bony pelvis is intact.  IMPRESSION:  1.  Stable small bilateral external iliac chain lymph nodes and small inguinal lymph nodes. 2.  No abdominal/pelvic mass or adenopathy.   Original Report Authenticated By: Rudie Meyer, M.D.   ASSESSMENT AND PLAN: This is a very pleasant 57 years old white male with stage III follicular lymphoma currently on maintenance chemotherapy with Rituxan is status post 9 cycles. He is tolerating his treatment fairly well with no significant adverse effects. The patient has no evidence for disease progression on his recent scan. I discussed the scan results with the patient and recommended for him to continue his maintenance therapy for the next 3 cycles. He would come back for followup visit in 2 months with the next cycle of his treatment. He was advised to call immediately if he has any concerning symptoms in the interval.  All questions were answered. The patient knows to call the clinic with any problems, questions or concerns. We can certainly see the patient much sooner if necessary.  I spent 15 minutes counseling the patient face to face. The total time spent in the appointment was 25 minutes.

## 2012-07-23 NOTE — Telephone Encounter (Signed)
Per staff message and POF I have scheduled appts.  JMW  

## 2012-07-23 NOTE — Patient Instructions (Signed)
No evidence for disease progression on the CT scan.  Followup visit in 2 months with the next cycle of chemotherapy.

## 2012-07-30 DIAGNOSIS — Z720 Tobacco use: Secondary | ICD-10-CM | POA: Insufficient documentation

## 2012-08-31 ENCOUNTER — Other Ambulatory Visit: Payer: Self-pay | Admitting: Physician Assistant

## 2012-08-31 DIAGNOSIS — C8299 Follicular lymphoma, unspecified, extranodal and solid organ sites: Secondary | ICD-10-CM

## 2012-09-17 ENCOUNTER — Telehealth: Payer: Self-pay | Admitting: Internal Medicine

## 2012-09-17 ENCOUNTER — Other Ambulatory Visit (HOSPITAL_BASED_OUTPATIENT_CLINIC_OR_DEPARTMENT_OTHER): Payer: 59

## 2012-09-17 ENCOUNTER — Ambulatory Visit (HOSPITAL_BASED_OUTPATIENT_CLINIC_OR_DEPARTMENT_OTHER): Payer: 59 | Admitting: Physician Assistant

## 2012-09-17 ENCOUNTER — Telehealth: Payer: Self-pay | Admitting: *Deleted

## 2012-09-17 ENCOUNTER — Ambulatory Visit (HOSPITAL_BASED_OUTPATIENT_CLINIC_OR_DEPARTMENT_OTHER): Payer: 59

## 2012-09-17 ENCOUNTER — Encounter: Payer: Self-pay | Admitting: Physician Assistant

## 2012-09-17 VITALS — BP 136/77 | HR 63 | Temp 98.2°F | Resp 20 | Ht 70.0 in | Wt 342.0 lb

## 2012-09-17 VITALS — BP 112/70 | HR 60 | Temp 97.0°F | Resp 18

## 2012-09-17 DIAGNOSIS — Z5112 Encounter for antineoplastic immunotherapy: Secondary | ICD-10-CM

## 2012-09-17 DIAGNOSIS — C8299 Follicular lymphoma, unspecified, extranodal and solid organ sites: Secondary | ICD-10-CM

## 2012-09-17 DIAGNOSIS — C8210 Follicular lymphoma grade II, unspecified site: Secondary | ICD-10-CM

## 2012-09-17 DIAGNOSIS — C8290 Follicular lymphoma, unspecified, unspecified site: Secondary | ICD-10-CM

## 2012-09-17 LAB — COMPREHENSIVE METABOLIC PANEL (CC13)
AST: 22 U/L (ref 5–34)
Albumin: 3.5 g/dL (ref 3.5–5.0)
Alkaline Phosphatase: 123 U/L (ref 40–150)
BUN: 17.3 mg/dL (ref 7.0–26.0)
Potassium: 4.3 mEq/L (ref 3.5–5.1)
Sodium: 140 mEq/L (ref 136–145)
Total Protein: 6.5 g/dL (ref 6.4–8.3)

## 2012-09-17 LAB — CBC WITH DIFFERENTIAL/PLATELET
Basophils Absolute: 0.1 10*3/uL (ref 0.0–0.1)
EOS%: 6.6 % (ref 0.0–7.0)
Eosinophils Absolute: 0.5 10*3/uL (ref 0.0–0.5)
HGB: 13.5 g/dL (ref 13.0–17.1)
LYMPH%: 17 % (ref 14.0–49.0)
MCH: 29.6 pg (ref 27.2–33.4)
MCV: 89.9 fL (ref 79.3–98.0)
MONO%: 11.8 % (ref 0.0–14.0)
NEUT#: 4.5 10*3/uL (ref 1.5–6.5)
Platelets: 205 10*3/uL (ref 140–400)
RBC: 4.56 10*6/uL (ref 4.20–5.82)
RDW: 14.7 % — ABNORMAL HIGH (ref 11.0–14.6)

## 2012-09-17 LAB — LACTATE DEHYDROGENASE (CC13): LDH: 198 U/L (ref 125–245)

## 2012-09-17 MED ORDER — SODIUM CHLORIDE 0.9 % IV SOLN
Freq: Once | INTRAVENOUS | Status: AC
  Administered 2012-09-17: 10:00:00 via INTRAVENOUS

## 2012-09-17 MED ORDER — SODIUM CHLORIDE 0.9 % IV SOLN
375.0000 mg/m2 | Freq: Once | INTRAVENOUS | Status: AC
  Administered 2012-09-17: 1000 mg via INTRAVENOUS
  Filled 2012-09-17: qty 100

## 2012-09-17 MED ORDER — DIPHENHYDRAMINE HCL 25 MG PO CAPS
50.0000 mg | ORAL_CAPSULE | Freq: Once | ORAL | Status: AC
  Administered 2012-09-17: 50 mg via ORAL

## 2012-09-17 MED ORDER — ACETAMINOPHEN 325 MG PO TABS
ORAL_TABLET | ORAL | Status: AC
  Filled 2012-09-17: qty 2

## 2012-09-17 MED ORDER — DIPHENHYDRAMINE HCL 25 MG PO CAPS
ORAL_CAPSULE | ORAL | Status: AC
  Filled 2012-09-17: qty 2

## 2012-09-17 MED ORDER — HEPARIN SOD (PORK) LOCK FLUSH 100 UNIT/ML IV SOLN
500.0000 [IU] | Freq: Once | INTRAVENOUS | Status: AC | PRN
  Administered 2012-09-17: 500 [IU]
  Filled 2012-09-17: qty 5

## 2012-09-17 MED ORDER — SODIUM CHLORIDE 0.9 % IJ SOLN
10.0000 mL | INTRAMUSCULAR | Status: DC | PRN
  Administered 2012-09-17: 10 mL
  Filled 2012-09-17: qty 10

## 2012-09-17 MED ORDER — ACETAMINOPHEN 325 MG PO TABS
650.0000 mg | ORAL_TABLET | Freq: Once | ORAL | Status: AC
  Administered 2012-09-17: 650 mg via ORAL

## 2012-09-17 NOTE — Telephone Encounter (Signed)
Per staff message and POF I have scheduled appts.  JMW  

## 2012-09-17 NOTE — Progress Notes (Addendum)
[facility] Telephone:(336) [phone]   Fax:(336) [phone]  SHARED VISIT PROGRESS NOTE  [name], MD [address] [facility] [place], [place]. [place] [address]  DIAGNOSIS: : Stage III follicular lymphoma, grade 2, diagnosed in June 2012   PRIOR THERAPY: Systemic chemotherapy with CHOP/Rituxan, status post 8 cycles, last dose was given 07/19/2010.   CURRENT THERAPY: Maintenance therapy with Rituxan 375 mg/m2 given every 2 months for a total of 2 years, status post 10 cycles.  INTERVAL HISTORY: [name] 57 y.o. male returns to the clinic today for two-month followup visit the patient is feeling fine today with no specific complaints. He denied having any significant weight loss.He is having some night sweats but he states they are stable. He denied having any chest pain, shortness breath, cough or hemoptysis. He denied having any palpable lymphadenopathy. The patient is tolerating his treatment with maintenance Rituxan fairly well.   MEDICAL HISTORY: Past Medical History  Diagnosis Date  . Hypertension   . Venous insufficiency   . Dyslipidemia   . nhl dx'd 01/2010  . Gout attack 01/16/2012    ALLERGIES:  is allergic to penicillins.  MEDICATIONS:  Current Outpatient Prescriptions  Medication Sig Dispense Refill  . allopurinol (ZYLOPRIM) 100 MG tablet TAKE 1 TABLET BY MOUTH ONCE DAILY  90 tablet  0  . amLODipine (NORVASC) 5 MG tablet Take 5 mg by mouth daily.        [name] Kitchen BENICAR HCT 40-25 MG per tablet       . Cholecalciferol (VITAMIN D-3 PO) Take 1 tablet by mouth daily.        . Ibuprofen (ADVIL) 200 MG CAPS Take by mouth as needed.        . lidocaine-prilocaine (EMLA) cream Apply topically as needed.  30 g  1  . multivitamin-iron-minerals-folic acid (CENTRUM) chewable tablet Chew 1 tablet by mouth daily.      . Nebivolol HCl (BYSTOLIC PO) Take 0.5 tablets by mouth daily.        . Omega-3 Fatty Acids (FISH OIL PO) Take 1 tablet by mouth  daily.         No current facility-administered medications for this visit.   Facility-Administered Medications Ordered in Other Visits  Medication Dose Route Frequency Provider Last Rate Last Dose  . acetaminophen (TYLENOL) 325 MG tablet           . diphenhydrAMINE (BENADRYL) 25 mg capsule           . sodium chloride 0.9 % injection 10 mL  10 mL Intracatheter PRN Si Gaul, MD   10 mL at 09/17/12 1408    REVIEW OF SYSTEMS:  A comprehensive review of systems was negative.   PHYSICAL EXAMINATION: General appearance: alert, cooperative and no distress Head: Normocephalic, without obvious abnormality, atraumatic Neck: no adenopathy Lymph nodes: Cervical, supraclavicular, and axillary nodes normal. Resp: clear to auscultation bilaterally Cardio: regular rate and rhythm, S1, S2 normal, no murmur, click, rub or gallop GI: soft, non-tender; bowel sounds normal; no masses,  no organomegaly Extremities: extremities normal, atraumatic, no cyanosis or edema Neurologic: Alert and oriented X 3, normal strength and tone. Normal symmetric reflexes. Normal coordination and gait  ECOG PERFORMANCE STATUS: 0 - Asymptomatic  Blood pressure 136/77, pulse 63, temperature 98.2 F (36.8 C), temperature source Oral, resp. rate 20, height 5\' 10"  (1.778 m), weight 342 lb (155.13 kg).  LABORATORY DATA: Lab Results  Component Value Date   WBC 7.0 09/17/2012  HGB 13.5 09/17/2012   HCT 41.0 09/17/2012   MCV 89.9 09/17/2012   PLT 205 09/17/2012      Chemistry      Component Value Date/Time   NA 140 09/17/2012 0811   NA 138 07/11/2011 1417   NA 138 05/16/2011 0858   K 4.3 09/17/2012 0811   K 4.2 07/11/2011 1417   K 4.3 05/16/2011 0858   CL 105 05/14/2012 0840   CL 96* 07/11/2011 1417   CL 102 05/16/2011 0858   CO2 25 09/17/2012 0811   CO2 26 07/11/2011 1417   CO2 22 05/16/2011 0858   BUN 17.3 09/17/2012 0811   BUN 18 07/11/2011 1417   BUN 22 05/16/2011 0858   CREATININE 0.9 09/17/2012 0811   CREATININE 1.1 07/11/2011 1417    CREATININE 1.04 05/16/2011 0858      Component Value Date/Time   CALCIUM 9.5 09/17/2012 0811   CALCIUM 8.4 07/11/2011 1417   CALCIUM 9.2 05/16/2011 0858   ALKPHOS 123 09/17/2012 0811   ALKPHOS 124* 07/11/2011 1417   ALKPHOS 119* 05/16/2011 0858   AST 22 09/17/2012 0811   AST 32 07/11/2011 1417   AST 23 05/16/2011 0858   ALT 29 09/17/2012 0811   ALT 35 07/11/2011 1417   ALT 28 05/16/2011 0858   BILITOT 0.30 09/17/2012 0811   BILITOT 0.50 07/11/2011 1417   BILITOT 0.4 05/16/2011 0858       RADIOGRAPHIC STUDIES: Ct Chest W Contrast  07/16/2012   *RADIOLOGY REPORT*  Clinical Data:  Non-Hodgkins lymphoma.  CT CHEST, ABDOMEN AND PELVIS WITH CONTRAST  Technique:  Multidetector CT imaging of the chest, abdomen and pelvis was performed following the standard protocol during bolus administration of intravenous contrast.  Contrast: OMNIPAQUE IOHEXOL 300 MG/ML  SOLN  Comparison:  01/13/2012    CT CHEST  Findings:  The chest wall is unremarkable and stable.  A left-sided Port-A-Cath is noted.  No supraclavicular or axillary lymphadenopathy.  The thyroid gland appears normal and stable.  The bony thorax is intact.  The upper posterior ribs have a somewhat moth-eaten appearance.  This appears relatively stable when compared the prior CT scan and PET CT.  It is possible this represents a fibrous dysplasia or other metabolic bone disease process.  I do not see any definite destructive bony lesions or spinal canal compromise.  Moderate degenerative changes involving the spine.  The heart is normal in size.  No pericardial effusion.  No mediastinal or hilar lymphadenopathy.  Small scattered lymph nodes are stable.  The aorta is normal in caliber.  No dissection.  Dense coronary artery calcifications are noted.  The esophagus is grossly normal.  Examination of the lung parenchyma demonstrates no acute pulmonary findings.  No worrisome pulmonary nodules or masses.  No pleural effusion.  IMPRESSION:  1.  No CT findings for adenopathy  involving the chest. 2.  Somewhat moth-eaten appearance of the upper posterior ribs. This could be a benign process but cannot exclude lymphoma in the bone or treated lymphoma.  Recommend continued observation. 3.  No acute pulmonary findings.    CT ABDOMEN AND PELVIS  Findings:  The solid abdominal organs are unremarkable and stable. No focal lesions and no splenomegaly.  The gallbladder is normal. No common bile duct dilatation.  The stomach, duodenum, small bowel and colon are unremarkable.  The appendix is normal.  No mesenteric or retroperitoneal mass or adenopathy.  The aorta demonstrates advanced atherosclerotic calcifications but no aneurysm or dissection.  The bladder,  prostate gland and seminal vesicles are unremarkable. No pelvic mass or adenopathy.  No free pelvic fluid collections.  A few small scattered lymph nodes are stable.  The bony pelvis is intact.  IMPRESSION:  1.  Stable small bilateral external iliac chain lymph nodes and small inguinal lymph nodes. 2.  No abdominal/pelvic mass or adenopathy.   Original Report Authenticated By: Rudie Meyer, M.D.   ASSESSMENT AND PLAN: This is a very pleasant 57 years old white male with stage III follicular lymphoma currently on maintenance chemotherapy with Rituxan is status post 10 cycles. He is tolerating his treatment fairly well with no significant adverse effects. The patient had no evidence for disease progression on his recent scan. Patient was discussed with him also seen by Dr. Arbutus Ped. He will proceed with cycle #11 of his maintenance therapy with Rituxan. He will followup in 2 months prior to cycle #12 with a repeat CBC differential, C. met and LDH. H He was advised to call immediately if he has any concerning symptoms in the interval.  All questions were answered. The patient knows to call the clinic with any problems, questions or concerns. We can certainly see the patient much sooner if necessary.  Conni Slipper  PA-C   ADDENDUM: Hematology/Oncology Attending: I have a face to face encounter with the patient today. I recommended his care plan. The patient has a stage III follicular lymphoma currently on maintenance treatment with Rituxan is status post 10 cycles. He is tolerating his treatment fairly well with no significant adverse effects. We will proceed with cycle #11 today as scheduled. The patient would come back for followup visit in 2 months with the last cycle of this maintenance course of Rituxan. He was advised to call immediately if he has any concerning symptoms in the interval. Lajuana Matte., MD 09/17/2012

## 2012-09-17 NOTE — Patient Instructions (Addendum)
[facility] Discharge Instructions for Patients Receiving Chemotherapy  Today you received the following chemotherapy agent Rituxan.   If you develop nausea and vomiting that is not controlled by your nausea medication, call the clinic.   BELOW ARE SYMPTOMS THAT SHOULD BE REPORTED IMMEDIATELY:  *FEVER GREATER THAN 100.5 F  *CHILLS WITH OR WITHOUT FEVER  NAUSEA AND VOMITING THAT IS NOT CONTROLLED WITH YOUR NAUSEA MEDICATION  *UNUSUAL SHORTNESS OF BREATH  *UNUSUAL BRUISING OR BLEEDING  TENDERNESS IN MOUTH AND THROAT WITH OR WITHOUT PRESENCE OF ULCERS  *URINARY PROBLEMS  *BOWEL PROBLEMS  UNUSUAL RASH Items with * indicate a potential emergency and should be followed up as soon as possible.  Feel free to call the clinic you have any questions or concerns. The clinic phone number is [phone].   Rituximab injection What is this medicine? RITUXIMAB (ri TUX i mab) is a monoclonal antibody. This medicine changes the way the body's immune system works. It is used commonly to treat non-Hodgkin's lymphoma and other conditions. In cancer cells, this drug targets a specific protein within cancer cells and stops the cancer cells from growing. It is also used to treat rhuematoid arthritis (RA). In RA, this medicine slow the inflammatory process and help reduce joint pain and swelling. This medicine is often used with other cancer or arthritis medications. This medicine may be used for other purposes; ask your health care provider or pharmacist if you have questions. What should I tell my health care provider before I take this medicine? They need to know if you have any of these conditions: -blood disorders -heart disease -history of hepatitis B -infection (especially a virus infection such as chickenpox, cold sores, or herpes) -irregular heartbeat -kidney disease -lung or breathing disease, like asthma -lupus -an unusual or allergic reaction to rituximab, mouse  proteins, other medicines, foods, dyes, or preservatives -pregnant or trying to get pregnant -breast-feeding How should I use this medicine? This medicine is for infusion into a vein. It is administered in a hospital or clinic by a specially trained health care professional. A special MedGuide will be given to you by the pharmacist with each prescription and refill. Be sure to read this information carefully each time. Talk to your pediatrician regarding the use of this medicine in children. This medicine is not approved for use in children. Overdosage: If you think you have taken too much of this medicine contact a poison control center or emergency room at once. NOTE: This medicine is only for you. Do not share this medicine with others. What if I miss a dose? It is important not to miss a dose. Call your doctor or health care professional if you are unable to keep an appointment. What may interact with this medicine? -cisplatin -medicines for blood pressure -some other medicines for arthritis -vaccines This list may not describe all possible interactions. Give your health care provider a list of all the medicines, herbs, non-prescription drugs, or dietary supplements you use. Also tell them if you smoke, drink alcohol, or use illegal drugs. Some items may interact with your medicine. What should I watch for while using this medicine? Report any side effects that you notice during your treatment right away, such as changes in your breathing, fever, chills, dizziness or lightheadedness. These effects are more common with the first dose. Visit your prescriber or health care professional for checks on your progress. You will need to have regular blood work. Report any other side effects. The side effects  of this medicine can continue after you finish your treatment. Continue your course of treatment even though you feel ill unless your doctor tells you to stop. Call your doctor or health care  professional for advice if you get a fever, chills or sore throat, or other symptoms of a cold or flu. Do not treat yourself. This drug decreases your body's ability to fight infections. Try to avoid being around people who are sick. This medicine may increase your risk to bruise or bleed. Call your doctor or health care professional if you notice any unusual bleeding. Be careful brushing and flossing your teeth or using a toothpick because you may get an infection or bleed more easily. If you have any dental work done, tell your dentist you are receiving this medicine. Avoid taking products that contain aspirin, acetaminophen, ibuprofen, naproxen, or ketoprofen unless instructed by your doctor. These medicines may hide a fever. Do not become pregnant while taking this medicine. Women should inform their doctor if they wish to become pregnant or think they might be pregnant. There is a potential for serious side effects to an unborn child. Talk to your health care professional or pharmacist for more information. Do not breast-feed an infant while taking this medicine. What side effects may I notice from receiving this medicine? Side effects that you should report to your doctor or health care professional as soon as possible: -allergic reactions like skin rash, itching or hives, swelling of the face, lips, or tongue -low blood counts - this medicine may decrease the number of white blood cells, red blood cells and platelets. You may be at increased risk for infections and bleeding. -signs of infection - fever or chills, cough, sore throat, pain or difficulty passing urine -signs of decreased platelets or bleeding - bruising, pinpoint red spots on the skin, black, tarry stools, blood in the urine -signs of decreased red blood cells - unusually weak or tired, fainting spells, lightheadedness -breathing problems -confused, not responsive -chest pain -fast, irregular heartbeat -feeling faint or  lightheaded, falls -mouth sores -redness, blistering, peeling or loosening of the skin, including inside the mouth -stomach pain -swelling of the ankles, feet, or hands -trouble passing urine or change in the amount of urine Side effects that usually do not require medical attention (report to your doctor or other health care professional if they continue or are bothersome): -anxiety -headache -loss of appetite -muscle aches -nausea -night sweats This list may not describe all possible side effects. Call your doctor for medical advice about side effects. You may report side effects to FDA at 1-800-FDA-1088. Where should I keep my medicine? This drug is given in a hospital or clinic and will not be stored at home. NOTE: This sheet is a summary. It may not cover all possible information. If you have questions about this medicine, talk to your doctor, pharmacist, or health care provider.  2013, [employer]. (08/27/2007 2:04:59 PM)

## 2012-09-17 NOTE — Telephone Encounter (Signed)
Gave pt appt for lab an MD , emailed Marcelino Duster regarding chemo for 11/19/12

## 2012-09-17 NOTE — Patient Instructions (Addendum)
Follow up in 2 months prior to your last scheduled cycle of maintenance Rituxan

## 2012-09-17 NOTE — Telephone Encounter (Signed)
Called pt, left message regarding appt on november 2014

## 2012-11-19 ENCOUNTER — Telehealth: Payer: Self-pay | Admitting: Internal Medicine

## 2012-11-19 ENCOUNTER — Encounter: Payer: Self-pay | Admitting: Internal Medicine

## 2012-11-19 ENCOUNTER — Ambulatory Visit (HOSPITAL_BASED_OUTPATIENT_CLINIC_OR_DEPARTMENT_OTHER): Payer: BC Managed Care – PPO

## 2012-11-19 ENCOUNTER — Ambulatory Visit (HOSPITAL_BASED_OUTPATIENT_CLINIC_OR_DEPARTMENT_OTHER): Payer: BC Managed Care – PPO | Admitting: Internal Medicine

## 2012-11-19 ENCOUNTER — Other Ambulatory Visit (HOSPITAL_BASED_OUTPATIENT_CLINIC_OR_DEPARTMENT_OTHER): Payer: BC Managed Care – PPO

## 2012-11-19 VITALS — BP 124/72 | HR 70 | Temp 98.4°F | Resp 18

## 2012-11-19 VITALS — BP 138/78 | HR 68 | Temp 97.6°F | Resp 18 | Ht 70.0 in | Wt 340.2 lb

## 2012-11-19 DIAGNOSIS — Z5112 Encounter for antineoplastic immunotherapy: Secondary | ICD-10-CM

## 2012-11-19 DIAGNOSIS — C8299 Follicular lymphoma, unspecified, extranodal and solid organ sites: Secondary | ICD-10-CM

## 2012-11-19 DIAGNOSIS — C8210 Follicular lymphoma grade II, unspecified site: Secondary | ICD-10-CM

## 2012-11-19 DIAGNOSIS — C8290 Follicular lymphoma, unspecified, unspecified site: Secondary | ICD-10-CM

## 2012-11-19 LAB — COMPREHENSIVE METABOLIC PANEL (CC13)
Albumin: 3.4 g/dL — ABNORMAL LOW (ref 3.5–5.0)
Alkaline Phosphatase: 124 U/L (ref 40–150)
Anion Gap: 10 mEq/L (ref 3–11)
BUN: 12.6 mg/dL (ref 7.0–26.0)
Calcium: 9.2 mg/dL (ref 8.4–10.4)
Chloride: 106 mEq/L (ref 98–109)
Creatinine: 0.8 mg/dL (ref 0.7–1.3)
Glucose: 95 mg/dl (ref 70–140)
Potassium: 3.9 mEq/L (ref 3.5–5.1)

## 2012-11-19 LAB — CBC WITH DIFFERENTIAL/PLATELET
Basophils Absolute: 0.1 10*3/uL (ref 0.0–0.1)
EOS%: 4.8 % (ref 0.0–7.0)
HGB: 13.8 g/dL (ref 13.0–17.1)
MCH: 29.3 pg (ref 27.2–33.4)
MCV: 89 fL (ref 79.3–98.0)
MONO%: 12.1 % (ref 0.0–14.0)
RDW: 14.8 % — ABNORMAL HIGH (ref 11.0–14.6)

## 2012-11-19 MED ORDER — HEPARIN SOD (PORK) LOCK FLUSH 100 UNIT/ML IV SOLN
500.0000 [IU] | Freq: Once | INTRAVENOUS | Status: AC | PRN
  Administered 2012-11-19: 500 [IU]
  Filled 2012-11-19: qty 5

## 2012-11-19 MED ORDER — SODIUM CHLORIDE 0.9 % IV SOLN
375.0000 mg/m2 | Freq: Once | INTRAVENOUS | Status: AC
  Administered 2012-11-19: 1000 mg via INTRAVENOUS
  Filled 2012-11-19: qty 100

## 2012-11-19 MED ORDER — DIPHENHYDRAMINE HCL 25 MG PO CAPS
50.0000 mg | ORAL_CAPSULE | Freq: Once | ORAL | Status: AC
  Administered 2012-11-19: 50 mg via ORAL

## 2012-11-19 MED ORDER — ACETAMINOPHEN 325 MG PO TABS
ORAL_TABLET | ORAL | Status: AC
  Filled 2012-11-19: qty 2

## 2012-11-19 MED ORDER — ACETAMINOPHEN 325 MG PO TABS
650.0000 mg | ORAL_TABLET | Freq: Once | ORAL | Status: AC
  Administered 2012-11-19: 650 mg via ORAL

## 2012-11-19 MED ORDER — SODIUM CHLORIDE 0.9 % IJ SOLN
10.0000 mL | INTRAMUSCULAR | Status: DC | PRN
  Administered 2012-11-19: 10 mL
  Filled 2012-11-19: qty 10

## 2012-11-19 MED ORDER — DIPHENHYDRAMINE HCL 25 MG PO CAPS
ORAL_CAPSULE | ORAL | Status: AC
  Filled 2012-11-19: qty 2

## 2012-11-19 MED ORDER — SODIUM CHLORIDE 0.9 % IV SOLN
Freq: Once | INTRAVENOUS | Status: AC
  Administered 2012-11-19: 10:00:00 via INTRAVENOUS

## 2012-11-19 NOTE — Progress Notes (Signed)
Called and left message for the patient because he had left message.

## 2012-11-19 NOTE — Patient Instructions (Signed)
Followup visit in 2 months with repeat CT scan of the chest, abdomen and pelvis. 

## 2012-11-19 NOTE — Telephone Encounter (Signed)
gv and printed appt sched and avs for pt for Jan 2015....gv pt barium    °

## 2012-11-19 NOTE — Progress Notes (Signed)
[facility] Telephone:([phone]   Fax:(336) [phone]  OFFICE PROGRESS NOTE  [name], MD [address] [facility], [place]. [place] [address]  DIAGNOSIS: : Stage III follicular lymphoma, grade 2, diagnosed in June 2012   PRIOR THERAPY: Systemic chemotherapy with CHOP/Rituxan, status post 8 cycles, last dose was given 07/19/2010.   CURRENT THERAPY: Maintenance therapy with Rituxan 375 mg/m2 given every 2 months for a total of 2 years, status post 11 cycles.  INTERVAL HISTORY: [name] 57 y.o. male returns to the clinic today for two-month followup visit the patient is feeling fine today with no specific complaints. He denied having any significant weight loss or night sweats. He denied having any chest pain, shortness breath, cough or hemoptysis. He denied having any palpable lymphadenopathy. The patient is tolerating his treatment with maintenance Rituxan fairly well.   MEDICAL HISTORY: Past Medical History  Diagnosis Date  . Hypertension   . Venous insufficiency   . Dyslipidemia   . nhl dx'd 01/2010  . Gout attack 01/16/2012    ALLERGIES:  is allergic to penicillins.  MEDICATIONS:  Current Outpatient Prescriptions  Medication Sig Dispense Refill  . allopurinol (ZYLOPRIM) 100 MG tablet TAKE 1 TABLET BY MOUTH ONCE DAILY  90 tablet  0  . amLODipine (NORVASC) 5 MG tablet Take 5 mg by mouth daily.        [name] Kitchen BENICAR HCT 40-25 MG per tablet       . Cholecalciferol (VITAMIN D-3 PO) Take 1 tablet by mouth daily.        . Ibuprofen (ADVIL) 200 MG CAPS Take by mouth as needed.        . lidocaine-prilocaine (EMLA) cream Apply topically as needed.  30 g  1  . multivitamin-iron-minerals-folic acid (CENTRUM) chewable tablet Chew 1 tablet by mouth daily.      . Nebivolol HCl (BYSTOLIC PO) Take 0.5 tablets by mouth daily.        . Omega-3 Fatty Acids (FISH OIL PO) Take 1 tablet by mouth daily.         No current facility-administered  medications for this visit.    REVIEW OF SYSTEMS:  A comprehensive review of systems was negative.   PHYSICAL EXAMINATION: General appearance: alert, cooperative and no distress Head: Normocephalic, without obvious abnormality, atraumatic Neck: no adenopathy Lymph nodes: Cervical, supraclavicular, and axillary nodes normal. Resp: clear to auscultation bilaterally Cardio: regular rate and rhythm, S1, S2 normal, no murmur, click, rub or gallop GI: soft, non-tender; bowel sounds normal; no masses,  no organomegaly Extremities: extremities normal, atraumatic, no cyanosis or edema Neurologic: Alert and oriented X 3, normal strength and tone. Normal symmetric reflexes. Normal coordination and gait  ECOG PERFORMANCE STATUS: 0 - Asymptomatic  Blood pressure 138/78, pulse 68, temperature 97.6 F (36.4 C), temperature source Oral, resp. rate 18, height 5\' 10"  (1.778 m), weight 340 lb 3.2 oz (154.314 kg), SpO2 100.00%.  LABORATORY DATA: Lab Results  Component Value Date   WBC 8.6 11/19/2012   HGB 13.8 11/19/2012   HCT 41.9 11/19/2012   MCV 89.0 11/19/2012   PLT 239 11/19/2012      Chemistry      Component Value Date/Time   NA 140 09/17/2012 0811   NA 138 07/11/2011 1417   NA 138 05/16/2011 0858   K 4.3 09/17/2012 0811   K 4.2 07/11/2011 1417   K 4.3 05/16/2011 0858   CL 105 05/14/2012 0840   CL  96* 07/11/2011 1417   CL 102 05/16/2011 0858   CO2 25 09/17/2012 0811   CO2 26 07/11/2011 1417   CO2 22 05/16/2011 0858   BUN 17.3 09/17/2012 0811   BUN 18 07/11/2011 1417   BUN 22 05/16/2011 0858   CREATININE 0.9 09/17/2012 0811   CREATININE 1.1 07/11/2011 1417   CREATININE 1.04 05/16/2011 0858      Component Value Date/Time   CALCIUM 9.5 09/17/2012 0811   CALCIUM 8.4 07/11/2011 1417   CALCIUM 9.2 05/16/2011 0858   ALKPHOS 123 09/17/2012 0811   ALKPHOS 124* 07/11/2011 1417   ALKPHOS 119* 05/16/2011 0858   AST 22 09/17/2012 0811   AST 32 07/11/2011 1417   AST 23 05/16/2011 0858   ALT 29 09/17/2012 0811   ALT 35 07/11/2011 1417    ALT 28 05/16/2011 0858   BILITOT 0.30 09/17/2012 0811   BILITOT 0.50 07/11/2011 1417   BILITOT 0.4 05/16/2011 0858        ASSESSMENT AND PLAN: This is a very pleasant 57 years old white male with stage III follicular lymphoma currently on maintenance chemotherapy with Rituxan is status post 11 cycles. He is tolerating his treatment fairly well with no significant adverse effects. I recommended for him to continue his maintenance therapy with the last cycle. He would come back for followup visit in 2 months with repeat CT scan of the chest, abdomen and pelvis for restaging of his disease. He was advised to call immediately if he has any concerning symptoms in the interval.  All questions were answered. The patient knows to call the clinic with any problems, questions or concerns. We can certainly see the patient much sooner if necessary.  I spent 15 minutes counseling the patient face to face. The total time spent in the appointment was 25 minutes.

## 2012-11-19 NOTE — Patient Instructions (Signed)
[facility] Discharge Instructions for Patients Receiving Chemotherapy  Today you received the following chemotherapy agents Rituxan.  To help prevent nausea and vomiting after your treatment, we encourage you to take your nausea medication as prescribed.   If you develop nausea and vomiting that is not controlled by your nausea medication, call the clinic.   BELOW ARE SYMPTOMS THAT SHOULD BE REPORTED IMMEDIATELY:  *FEVER GREATER THAN 100.5 F  *CHILLS WITH OR WITHOUT FEVER  NAUSEA AND VOMITING THAT IS NOT CONTROLLED WITH YOUR NAUSEA MEDICATION  *UNUSUAL SHORTNESS OF BREATH  *UNUSUAL BRUISING OR BLEEDING  TENDERNESS IN MOUTH AND THROAT WITH OR WITHOUT PRESENCE OF ULCERS  *URINARY PROBLEMS  *BOWEL PROBLEMS  UNUSUAL RASH Items with * indicate a potential emergency and should be followed up as soon as possible.  Feel free to call the clinic you have any questions or concerns. The clinic phone number is [phone].    

## 2012-11-20 ENCOUNTER — Ambulatory Visit: Payer: 59 | Admitting: Internal Medicine

## 2012-12-01 ENCOUNTER — Other Ambulatory Visit: Payer: Self-pay | Admitting: Physician Assistant

## 2012-12-21 ENCOUNTER — Telehealth: Payer: Self-pay | Admitting: *Deleted

## 2012-12-21 NOTE — Telephone Encounter (Signed)
sw pt informed him that [facility] will be on call 01/21/13. i offered an appt for 01/22/13. Pt chose to come in on 01/28/13 @ 3:30pm due to he has to stay on Mondays schedule...td

## 2013-01-14 ENCOUNTER — Other Ambulatory Visit (HOSPITAL_BASED_OUTPATIENT_CLINIC_OR_DEPARTMENT_OTHER): Payer: BC Managed Care – PPO

## 2013-01-14 ENCOUNTER — Ambulatory Visit (HOSPITAL_COMMUNITY)
Admission: RE | Admit: 2013-01-14 | Discharge: 2013-01-14 | Disposition: A | Discharge status: Home / Self Care | Payer: BC Managed Care – PPO | Source: Ambulatory Visit | Attending: Internal Medicine | Admitting: Internal Medicine

## 2013-01-14 ENCOUNTER — Encounter (HOSPITAL_COMMUNITY): Payer: Self-pay

## 2013-01-14 DIAGNOSIS — I2584 Coronary atherosclerosis due to calcified coronary lesion: Secondary | ICD-10-CM | POA: Insufficient documentation

## 2013-01-14 DIAGNOSIS — C8290 Follicular lymphoma, unspecified, unspecified site: Secondary | ICD-10-CM

## 2013-01-14 DIAGNOSIS — C8299 Follicular lymphoma, unspecified, extranodal and solid organ sites: Secondary | ICD-10-CM | POA: Insufficient documentation

## 2013-01-14 DIAGNOSIS — C8210 Follicular lymphoma grade II, unspecified site: Secondary | ICD-10-CM

## 2013-01-14 DIAGNOSIS — R599 Enlarged lymph nodes, unspecified: Secondary | ICD-10-CM | POA: Insufficient documentation

## 2013-01-14 DIAGNOSIS — J438 Other emphysema: Secondary | ICD-10-CM | POA: Insufficient documentation

## 2013-01-14 LAB — CBC WITH DIFFERENTIAL/PLATELET
BASO%: 1.2 % (ref 0.0–2.0)
Basophils Absolute: 0.1 10*3/uL (ref 0.0–0.1)
EOS%: 3.2 % (ref 0.0–7.0)
Eosinophils Absolute: 0.3 10*3/uL (ref 0.0–0.5)
HEMATOCRIT: 40.3 % (ref 38.4–49.9)
HGB: 13.4 g/dL (ref 13.0–17.1)
LYMPH%: 17.7 % (ref 14.0–49.0)
MCH: 29.6 pg (ref 27.2–33.4)
MCHC: 33.2 g/dL (ref 32.0–36.0)
MCV: 89.3 fL (ref 79.3–98.0)
MONO#: 1 10*3/uL — AB (ref 0.1–0.9)
MONO%: 10.9 % (ref 0.0–14.0)
NEUT#: 6.3 10*3/uL (ref 1.5–6.5)
NEUT%: 67 % (ref 39.0–75.0)
Platelets: 209 10*3/uL (ref 140–400)
RBC: 4.52 10*6/uL (ref 4.20–5.82)
RDW: 16.4 % — ABNORMAL HIGH (ref 11.0–14.6)
WBC: 9.3 10*3/uL (ref 4.0–10.3)
lymph#: 1.7 10*3/uL (ref 0.9–3.3)

## 2013-01-14 LAB — COMPREHENSIVE METABOLIC PANEL (CC13)
ALK PHOS: 134 U/L (ref 40–150)
ALT: 31 U/L (ref 0–55)
ANION GAP: 12 meq/L — AB (ref 3–11)
AST: 22 U/L (ref 5–34)
Albumin: 3.7 g/dL (ref 3.5–5.0)
BILIRUBIN TOTAL: 0.24 mg/dL (ref 0.20–1.20)
BUN: 25.2 mg/dL (ref 7.0–26.0)
CO2: 24 mEq/L (ref 22–29)
Calcium: 9.5 mg/dL (ref 8.4–10.4)
Chloride: 102 mEq/L (ref 98–109)
Creatinine: 1.1 mg/dL (ref 0.7–1.3)
GLUCOSE: 108 mg/dL (ref 70–140)
Potassium: 3.9 mEq/L (ref 3.5–5.1)
Sodium: 138 mEq/L (ref 136–145)
Total Protein: 6.8 g/dL (ref 6.4–8.3)

## 2013-01-14 LAB — LACTATE DEHYDROGENASE (CC13): LDH: 204 U/L (ref 125–245)

## 2013-01-14 MED ORDER — IOHEXOL 300 MG/ML  SOLN: 100.0000 mL | Freq: Once | INTRAMUSCULAR | Status: AC | PRN

## 2013-01-21 ENCOUNTER — Ambulatory Visit: Payer: BC Managed Care – PPO | Admitting: Internal Medicine

## 2013-01-22 ENCOUNTER — Ambulatory Visit: Payer: BC Managed Care – PPO | Admitting: Internal Medicine

## 2013-01-28 ENCOUNTER — Ambulatory Visit (HOSPITAL_BASED_OUTPATIENT_CLINIC_OR_DEPARTMENT_OTHER): Payer: BC Managed Care – PPO | Admitting: Internal Medicine

## 2013-01-28 ENCOUNTER — Encounter: Payer: Self-pay | Admitting: Internal Medicine

## 2013-01-28 VITALS — BP 126/75 | HR 69 | Temp 97.9°F | Resp 18 | Ht 70.0 in | Wt 342.9 lb

## 2013-01-28 DIAGNOSIS — C8299 Follicular lymphoma, unspecified, extranodal and solid organ sites: Secondary | ICD-10-CM

## 2013-01-28 DIAGNOSIS — C8210 Follicular lymphoma grade II, unspecified site: Secondary | ICD-10-CM

## 2013-01-28 NOTE — Patient Instructions (Signed)
Continue on observation with follow up visit and repeat CT scan of the chest, abdomen and pelvis in 6 months.

## 2013-01-28 NOTE — Progress Notes (Signed)
[facility] Telephone:([phone]) [phone]   Fax:(336) [phone]  OFFICE PROGRESS NOTE  Janalyn Rouse, MD [address] [place], [place]. [place] [address]  DIAGNOSIS: : Stage III follicular lymphoma, grade 2, diagnosed in June 2012   PRIOR THERAPY:  1) Systemic chemotherapy with CHOP/Rituxan, status post 8 cycles, last dose was given 07/19/2010.  2)  Maintenance therapy with Rituxan 375 mg/m2 given every 2 months for a total of 2 years, status post 12 cycles.  CURRENT THERAPY: Observation  INTERVAL HISTORY: [name] 58 y.o. male returns to the clinic today for two-month followup visit the patient is feeling fine today with no specific complaints. He denied having any significant weight loss or night sweats. He denied having any chest pain, shortness breath, cough or hemoptysis. He denied having any palpable lymphadenopathy. The patient the last cycle of his treatment with maintenance Rituxan fairly well. He had repeat CT scan of the chest, abdomen and pelvis performed recently and he is here for evaluation and discussion of his scan results.  MEDICAL HISTORY: Past Medical History  Diagnosis Date  . Hypertension   . Venous insufficiency   . Dyslipidemia   . nhl dx'd 01/2010  . Gout attack 01/16/2012    ALLERGIES:  is allergic to penicillins.  MEDICATIONS:  Current Outpatient Prescriptions  Medication Sig Dispense Refill  . allopurinol (ZYLOPRIM) 100 MG tablet TAKE 1 TABLET BY MOUTH ONCE DAILY  90 tablet  0  . amLODipine (NORVASC) 5 MG tablet Take 5 mg by mouth daily.        [name] Kitchen BENICAR HCT 40-25 MG per tablet       . carvedilol (COREG) 12.5 MG tablet 12.5 mg daily.      . Cholecalciferol (VITAMIN D-3 PO) Take 1 tablet by mouth daily.        . Ibuprofen (ADVIL) 200 MG CAPS Take by mouth as needed.        . lidocaine-prilocaine (EMLA) cream Apply topically as needed.  30 g  1  . multivitamin-iron-minerals-folic acid (CENTRUM) chewable tablet  Chew 1 tablet by mouth daily.      . Omega-3 Fatty Acids (FISH OIL PO) Take 1 tablet by mouth daily.         No current facility-administered medications for this visit.    REVIEW OF SYSTEMS:  Constitutional: negative Eyes: negative Ears, nose, mouth, throat, and face: negative Respiratory: negative Cardiovascular: negative Gastrointestinal: negative Genitourinary:negative Integument/breast: negative Hematologic/lymphatic: negative Musculoskeletal:negative Neurological: negative Behavioral/Psych: negative Endocrine: negative Allergic/Immunologic: negative   PHYSICAL EXAMINATION: General appearance: alert, cooperative and no distress Head: Normocephalic, without obvious abnormality, atraumatic Neck: no adenopathy Lymph nodes: Cervical, supraclavicular, and axillary nodes normal. Resp: clear to auscultation bilaterally Cardio: regular rate and rhythm, S1, S2 normal, no murmur, click, rub or gallop GI: soft, non-tender; bowel sounds normal; no masses,  no organomegaly Extremities: extremities normal, atraumatic, no cyanosis or edema Neurologic: Alert and oriented X 3, normal strength and tone. Normal symmetric reflexes. Normal coordination and gait  ECOG PERFORMANCE STATUS: 0 - Asymptomatic  Blood pressure 126/75, pulse 69, temperature 97.9 F (36.6 C), temperature source Oral, resp. rate 18, height 5\' 10"  (1.778 m), weight 342 lb 14.4 oz (155.538 kg), SpO2 100.00%.  LABORATORY DATA: Lab Results  Component Value Date   WBC 9.3 01/14/2013   HGB 13.4 01/14/2013   HCT 40.3 01/14/2013   MCV 89.3 01/14/2013   PLT 209 01/14/2013      Chemistry  Component Value Date/Time   NA 138 01/14/2013 1522   NA 138 07/11/2011 1417   NA 138 05/16/2011 0858   K 3.9 01/14/2013 1522   K 4.2 07/11/2011 1417   K 4.3 05/16/2011 0858   CL 105 05/14/2012 0840   CL 96* 07/11/2011 1417   CL 102 05/16/2011 0858   CO2 24 01/14/2013 1522   CO2 26 07/11/2011 1417   CO2 22 05/16/2011 0858   BUN 25.2 01/14/2013 1522   BUN  18 07/11/2011 1417   BUN 22 05/16/2011 0858   CREATININE 1.1 01/14/2013 1522   CREATININE 1.1 07/11/2011 1417   CREATININE 1.04 05/16/2011 0858      Component Value Date/Time   CALCIUM 9.5 01/14/2013 1522   CALCIUM 8.4 07/11/2011 1417   CALCIUM 9.2 05/16/2011 0858   ALKPHOS 134 01/14/2013 1522   ALKPHOS 124* 07/11/2011 1417   ALKPHOS 119* 05/16/2011 0858   AST 22 01/14/2013 1522   AST 32 07/11/2011 1417   AST 23 05/16/2011 0858   ALT 31 01/14/2013 1522   ALT 35 07/11/2011 1417   ALT 28 05/16/2011 0858   BILITOT 0.24 01/14/2013 1522   BILITOT 0.50 07/11/2011 1417   BILITOT 0.4 05/16/2011 0858      RADIOLOGY STUDIES:  Ct Chest W Contrast  01/14/2013   CLINICAL DATA:  Lymphoma.  EXAM: CT CHEST, ABDOMEN, AND PELVIS WITH CONTRAST  TECHNIQUE: Multidetector CT imaging of the chest, abdomen and pelvis was performed following the standard protocol during bolus administration of intravenous contrast.  CONTRAST:  100 cc Omnipaque 300.  COMPARISON:  07/16/2012.  FINDINGS:   CT CHEST FINDINGS  Left subclavian Port-A-Cath terminates in the SVC. No pathologically enlarged mediastinal, hilar or axillary lymph nodes. Three-vessel coronary artery calcification. Heart size normal. No pericardial effusion.  Trace paraseptal emphysema in the apices. Minimal dependent atelectasis bilaterally. Lungs are otherwise clear. Airway is unremarkable.    CT ABDOMEN AND PELVIS FINDINGS  Liver, gallbladder, adrenal glands, kidneys, spleen, pancreas, stomach and small bowel are unremarkable. Question constipation. Colon is otherwise unremarkable.  Bladder is relatively decompressed. Inguinal lymph nodes measure up to 1.6 cm on the right. Many of these have a fatty hilum. Left external iliac lymph node measures 9 mm, stable. Atherosclerotic calcification of the arterial vasculature without abdominal aortic aneurysm. No free fluid. Heterogeneous appearance of the posterior upper ribs is again noted. Findings do not appear progressive. Otherwise, no worrisome  lytic or sclerotic lesions. Probable bone island in the right iliac wing. Advanced multilevel degenerative change in the spine.   IMPRESSION: 1. No definitive evidence of recurrent lymphoma. Enlarged inguinal lymph nodes appear to have fatty hila. 2. Three-vessel coronary artery calcification.   Electronically Signed   By: Lorin Picket M.D.   On: 01/14/2013 16:54   ASSESSMENT AND PLAN: This is a very pleasant 58 years old white male with stage III follicular lymphoma currently on maintenance chemotherapy with Rituxan is status post 12 cycles. He tolerated his treatment fairly well with no significant adverse effects. His recent scan showed no evidence for disease recurrence. I discussed the scan results with the patient today. I recommended for him to continue on observation with repeat CT scan of the chest, abdomen and pelvis in 6 months. The patient was advised to call immediately if he has any concerning symptoms in the interval.  All questions were answered. The patient knows to call the clinic with any problems, questions or concerns. We can certainly see the patient much sooner if  necessary.  I spent 15 minutes counseling the patient face to face. The total time spent in the appointment was 25 minutes.  Disclaimer: This note was dictated with voice recognition software. Similar sounding words can inadvertently be transcribed and may not be corrected upon review.

## 2013-01-29 ENCOUNTER — Telehealth: Payer: Self-pay | Admitting: Internal Medicine

## 2013-01-29 NOTE — Telephone Encounter (Signed)
lmonvm for pt re appts for 7/20 and 7/23. schedule mailed.

## 2013-02-25 ENCOUNTER — Other Ambulatory Visit: Payer: Self-pay | Admitting: Physician Assistant

## 2013-02-26 ENCOUNTER — Other Ambulatory Visit: Payer: Self-pay | Admitting: Physician Assistant

## 2013-05-09 ENCOUNTER — Telehealth: Payer: Self-pay | Admitting: Medical Oncology

## 2013-05-09 NOTE — Telephone Encounter (Signed)
Can he take Nioxin minoxidil for topical application to his scalp?    will his  port melt under a tanning bed??  Per Dr Julien Nordmann I told pt that Dr Julien Nordmann did not know about minoxidil or tanning bed.

## 2013-07-29 ENCOUNTER — Ambulatory Visit (HOSPITAL_COMMUNITY)
Admission: RE | Admit: 2013-07-29 | Discharge: 2013-07-29 | Disposition: A | Discharge status: Home / Self Care | Payer: BC Managed Care – PPO | Source: Ambulatory Visit | Attending: Internal Medicine | Admitting: Internal Medicine

## 2013-07-29 ENCOUNTER — Other Ambulatory Visit (HOSPITAL_BASED_OUTPATIENT_CLINIC_OR_DEPARTMENT_OTHER): Payer: BC Managed Care – PPO

## 2013-07-29 ENCOUNTER — Encounter (HOSPITAL_COMMUNITY): Payer: Self-pay

## 2013-07-29 ENCOUNTER — Telehealth: Payer: Self-pay | Admitting: Internal Medicine

## 2013-07-29 DIAGNOSIS — C8299 Follicular lymphoma, unspecified, extranodal and solid organ sites: Secondary | ICD-10-CM

## 2013-07-29 DIAGNOSIS — C8210 Follicular lymphoma grade II, unspecified site: Secondary | ICD-10-CM

## 2013-07-29 DIAGNOSIS — M479 Spondylosis, unspecified: Secondary | ICD-10-CM | POA: Insufficient documentation

## 2013-07-29 DIAGNOSIS — K7689 Other specified diseases of liver: Secondary | ICD-10-CM | POA: Insufficient documentation

## 2013-07-29 DIAGNOSIS — C8589 Other specified types of non-Hodgkin lymphoma, extranodal and solid organ sites: Secondary | ICD-10-CM | POA: Insufficient documentation

## 2013-07-29 DIAGNOSIS — I7090 Unspecified atherosclerosis: Secondary | ICD-10-CM | POA: Insufficient documentation

## 2013-07-29 LAB — COMPREHENSIVE METABOLIC PANEL (CC13)
ALT: 27 U/L (ref 0–55)
AST: 21 U/L (ref 5–34)
Albumin: 3.5 g/dL (ref 3.5–5.0)
Alkaline Phosphatase: 116 U/L (ref 40–150)
Anion Gap: 9 mEq/L (ref 3–11)
BUN: 19.2 mg/dL (ref 7.0–26.0)
CALCIUM: 9.8 mg/dL (ref 8.4–10.4)
CO2: 26 mEq/L (ref 22–29)
Chloride: 104 mEq/L (ref 98–109)
Creatinine: 0.9 mg/dL (ref 0.7–1.3)
GLUCOSE: 108 mg/dL (ref 70–140)
Potassium: 4.1 mEq/L (ref 3.5–5.1)
Sodium: 138 mEq/L (ref 136–145)
Total Bilirubin: 0.37 mg/dL (ref 0.20–1.20)
Total Protein: 6.5 g/dL (ref 6.4–8.3)

## 2013-07-29 LAB — CBC WITH DIFFERENTIAL/PLATELET
BASO%: 1.1 % (ref 0.0–2.0)
BASOS ABS: 0.1 10*3/uL (ref 0.0–0.1)
EOS ABS: 0.2 10*3/uL (ref 0.0–0.5)
EOS%: 3 % (ref 0.0–7.0)
HCT: 42.7 % (ref 38.4–49.9)
HEMOGLOBIN: 13.8 g/dL (ref 13.0–17.1)
LYMPH#: 1.3 10*3/uL (ref 0.9–3.3)
LYMPH%: 16.8 % (ref 14.0–49.0)
MCH: 29.3 pg (ref 27.2–33.4)
MCHC: 32.4 g/dL (ref 32.0–36.0)
MCV: 90.2 fL (ref 79.3–98.0)
MONO#: 0.6 10*3/uL (ref 0.1–0.9)
MONO%: 8.2 % (ref 0.0–14.0)
NEUT#: 5.6 10*3/uL (ref 1.5–6.5)
NEUT%: 70.9 % (ref 39.0–75.0)
Platelets: 180 10*3/uL (ref 140–400)
RBC: 4.73 10*6/uL (ref 4.20–5.82)
RDW: 16.7 % — AB (ref 11.0–14.6)
WBC: 7.8 10*3/uL (ref 4.0–10.3)

## 2013-07-29 LAB — LACTATE DEHYDROGENASE (CC13): LDH: 207 U/L (ref 125–245)

## 2013-07-29 MED ORDER — IOHEXOL 300 MG/ML  SOLN
125.0000 mL | Freq: Once | INTRAMUSCULAR | Status: AC | PRN
  Administered 2013-07-29: 125 mL via INTRAVENOUS

## 2013-07-29 NOTE — Telephone Encounter (Signed)
pt called to r/s appt..done...pt aware of new d.t °

## 2013-08-01 ENCOUNTER — Ambulatory Visit: Payer: BC Managed Care – PPO | Admitting: Internal Medicine

## 2013-08-26 DIAGNOSIS — I2510 Atherosclerotic heart disease of native coronary artery without angina pectoris: Secondary | ICD-10-CM | POA: Insufficient documentation

## 2013-08-27 ENCOUNTER — Encounter: Payer: Self-pay | Admitting: Internal Medicine

## 2013-09-23 ENCOUNTER — Ambulatory Visit (HOSPITAL_BASED_OUTPATIENT_CLINIC_OR_DEPARTMENT_OTHER): Payer: BC Managed Care – PPO | Admitting: Internal Medicine

## 2013-09-23 VITALS — BP 130/66 | HR 79 | Temp 98.1°F | Resp 18 | Ht 70.0 in | Wt 344.2 lb

## 2013-09-23 DIAGNOSIS — C8210 Follicular lymphoma grade II, unspecified site: Secondary | ICD-10-CM

## 2013-09-23 DIAGNOSIS — C8299 Follicular lymphoma, unspecified, extranodal and solid organ sites: Secondary | ICD-10-CM

## 2013-09-23 NOTE — Progress Notes (Signed)
[facility] [phone]   Fax:([phone]  OFFICE PROGRESS NOTE  [name], MD [place] [address]  DIAGNOSIS: : Stage III follicular lymphoma, grade 2, diagnosed in June 2012   PRIOR THERAPY:  1) Systemic chemotherapy with CHOP/Rituxan, status post 8 cycles, last dose was given 07/19/2010.  2)  Maintenance therapy with Rituxan 375 mg/m2 given every 2 months for a total of 2 years, status post 12 cycles.  CURRENT THERAPY: Observation  INTERVAL HISTORY: [name] 58 y.o. male returns to the clinic today for two-month followup visit. He has been observation for the last 6 months. The patient is feeling fine today with no specific complaints. He denied having any significant weight loss or night sweats. He denied having any chest pain, shortness of breath, cough or hemoptysis. He denied having any palpable lymphadenopathy. He had repeat CT scan of the chest, abdomen and pelvis performed in July 2015 but he missed his followup appointment at that time and he is here for evaluation and discussion of his scan results.  MEDICAL HISTORY: Past Medical History  Diagnosis Date  . Hypertension   . Venous insufficiency   . Dyslipidemia   . Gout attack 01/16/2012  . nhl dx'd 01/2010    ALLERGIES:  is allergic to penicillins.  MEDICATIONS:  Current Outpatient Prescriptions  Medication Sig Dispense Refill  . allopurinol (ZYLOPRIM) 100 MG tablet TAKE 1 TABLET BY MOUTH ONCE DAILY  90 tablet  0  . amLODipine (NORVASC) 5 MG tablet Take 5 mg by mouth daily.        [name] Kitchen BENICAR HCT 40-25 MG per tablet       . carvedilol (COREG) 12.5 MG tablet 12.5 mg daily.      . Cholecalciferol (VITAMIN D-3 PO) Take 1 tablet by mouth daily.        . Ibuprofen (ADVIL) 200 MG CAPS Take by mouth as needed.        . lidocaine-prilocaine (EMLA) cream Apply topically as needed.  30 g  1  . multivitamin-iron-minerals-folic acid (CENTRUM) chewable tablet Chew 1  tablet by mouth daily.      . Omega-3 Fatty Acids (FISH OIL PO) Take 1 tablet by mouth daily.         No current facility-administered medications for this visit.    REVIEW OF SYSTEMS:  Constitutional: negative Eyes: negative Ears, nose, mouth, throat, and face: negative Respiratory: negative Cardiovascular: negative Gastrointestinal: negative Genitourinary:negative Integument/breast: negative Hematologic/lymphatic: negative Musculoskeletal:negative Neurological: negative Behavioral/Psych: negative Endocrine: negative Allergic/Immunologic: negative   PHYSICAL EXAMINATION: General appearance: alert, cooperative and no distress Head: Normocephalic, without obvious abnormality, atraumatic Neck: no adenopathy Lymph nodes: Cervical, supraclavicular, and axillary nodes normal. Resp: clear to auscultation bilaterally Cardio: regular rate and rhythm, S1, S2 normal, no murmur, click, rub or gallop GI: soft, non-tender; bowel sounds normal; no masses,  no organomegaly Extremities: extremities normal, atraumatic, no cyanosis or edema Neurologic: Alert and oriented X 3, normal strength and tone. Normal symmetric reflexes. Normal coordination and gait  ECOG PERFORMANCE STATUS: 0 - Asymptomatic  Blood pressure 130/66, pulse 79, temperature 98.1 F (36.7 C), temperature source Oral, resp. rate 18, height 5\' 10"  (1.778 m), weight 344 lb 3.2 oz (156.128 kg), SpO2 99.00%.  LABORATORY DATA: Lab Results  Component Value Date   WBC 7.8 07/29/2013   HGB 13.8 07/29/2013   HCT 42.7 07/29/2013   MCV 90.2 07/29/2013   PLT 180 07/29/2013  Chemistry      Component Value Date/Time   NA 138 07/29/2013 0813   NA 138 07/11/2011 1417   NA 138 05/16/2011 0858   K 4.1 07/29/2013 0813   K 4.2 07/11/2011 1417   K 4.3 05/16/2011 0858   CL 105 05/14/2012 0840   CL 96* 07/11/2011 1417   CL 102 05/16/2011 0858   CO2 26 07/29/2013 0813   CO2 26 07/11/2011 1417   CO2 22 05/16/2011 0858   BUN 19.2 07/29/2013 0813   BUN  18 07/11/2011 1417   BUN 22 05/16/2011 0858   CREATININE 0.9 07/29/2013 0813   CREATININE 1.1 07/11/2011 1417   CREATININE 1.04 05/16/2011 0858      Component Value Date/Time   CALCIUM 9.8 07/29/2013 0813   CALCIUM 8.4 07/11/2011 1417   CALCIUM 9.2 05/16/2011 0858   ALKPHOS 116 07/29/2013 0813   ALKPHOS 124* 07/11/2011 1417   ALKPHOS 119* 05/16/2011 0858   AST 21 07/29/2013 0813   AST 32 07/11/2011 1417   AST 23 05/16/2011 0858   ALT 27 07/29/2013 0813   ALT 35 07/11/2011 1417   ALT 28 05/16/2011 0858   BILITOT 0.37 07/29/2013 0813   BILITOT 0.50 07/11/2011 1417   BILITOT 0.4 05/16/2011 0858      RADIOLOGY STUDIES: CT CHEST, ABDOMEN, AND PELVIS WITH CONTRAST  TECHNIQUE:  Multidetector CT imaging of the chest, abdomen and pelvis was  performed following the standard protocol during bolus  administration of intravenous contrast.  CONTRAST: 140mL OMNIPAQUE IOHEXOL 300 MG/ML SOLN  COMPARISON: 01/14/2013.  FINDINGS:  CT CHEST FINDINGS  Left subclavian Port-A-Cath terminates in the low SVC. No  pathologically enlarged mediastinal, hilar or axillary lymph nodes.  Atherosclerotic calcification of the arterial vasculature, including  three-vessel involvement of the coronary arteries. Heart size  normal. No pericardial effusion.  Lungs are clear. No pleural fluid. Airway is unremarkable.  CT ABDOMEN AND PELVIS FINDINGS  7 mm low-attenuation lesion in the periphery of the right hepatic  lobe is unchanged and likely a cyst or hemangioma. Liver,  gallbladder, adrenal glands, kidneys, spleen, pancreas, stomach and  bowel are otherwise unremarkable. Prostate and bladder are  unremarkable.  Bilateral inguinal lymph nodes measure up to 1.9 cm on the left and  contain fatty hila, stable. Additional scattered lymph nodes are  subcentimeter in size and stable. Index left external iliac lymph  node measures 9 mm (image 118, series 2), stable.  Atherosclerotic calcification of the arterial vasculature without  abdominal  aortic aneurysm. No worrisome lytic or sclerotic lesions.  Degenerative changes are seen in the spine.  IMPRESSION:  1. No evidence of recurrent lymphoma.  2. Three-vessel coronary artery calcification.  Electronically Signed  By: Lorin Picket M.D.  On: 07/29/2013 09:32   ASSESSMENT AND PLAN: This is a very pleasant 58 years old white male with stage III follicular lymphoma currently on maintenance chemotherapy with Rituxan status post 12 cycles.  The CT scan of the chest, abdomen and pelvis performed in July 2015 showed no evidence for recurrent lymphoma. I discussed the scan results with the patient today. I recommended for him to continue on observation with repeat  CBC, comprehensive metabolic panel and LDH in 6 months. We will order repeat scans only on as-needed basis for symptom evaluation. The patient was advised to call immediately if he has any concerning symptoms in the interval.  All questions were answered. The patient knows to call the clinic with any problems, questions or concerns. We can certainly  see the patient much sooner if necessary.  Disclaimer: This note was dictated with voice recognition software. Similar sounding words can inadvertently be transcribed and may not be corrected upon review.

## 2013-10-21 ENCOUNTER — Ambulatory Visit (AMBULATORY_SURGERY_CENTER): Payer: Self-pay

## 2013-10-21 VITALS — Ht 70.0 in | Wt 339.6 lb

## 2013-10-21 DIAGNOSIS — Z1211 Encounter for screening for malignant neoplasm of colon: Secondary | ICD-10-CM

## 2013-10-21 MED ORDER — MOVIPREP 100 G PO SOLR: 1.0000 | Freq: Once | ORAL | Status: DC

## 2013-10-21 NOTE — Progress Notes (Signed)
No allergies to eggs or soy No past problems with anesthesia No diet/weight loss meds No home oxygen  Has email  Emmi instructions given for colonoscopy 

## 2013-10-25 ENCOUNTER — Other Ambulatory Visit: Payer: Self-pay

## 2013-10-29 ENCOUNTER — Encounter: Payer: Self-pay | Admitting: Internal Medicine

## 2013-11-04 ENCOUNTER — Encounter: Payer: Self-pay | Admitting: Internal Medicine

## 2013-11-04 ENCOUNTER — Ambulatory Visit (AMBULATORY_SURGERY_CENTER): Payer: BC Managed Care – PPO | Admitting: Internal Medicine

## 2013-11-04 VITALS — BP 120/72 | HR 55 | Temp 97.1°F | Resp 11 | Ht 70.0 in | Wt 339.0 lb

## 2013-11-04 DIAGNOSIS — D122 Benign neoplasm of ascending colon: Secondary | ICD-10-CM

## 2013-11-04 DIAGNOSIS — D120 Benign neoplasm of cecum: Secondary | ICD-10-CM

## 2013-11-04 DIAGNOSIS — Z1211 Encounter for screening for malignant neoplasm of colon: Secondary | ICD-10-CM

## 2013-11-04 DIAGNOSIS — D123 Benign neoplasm of transverse colon: Secondary | ICD-10-CM

## 2013-11-04 MED ORDER — SODIUM CHLORIDE 0.9 % IV SOLN: 500.0000 mL | INTRAVENOUS | Status: DC

## 2013-11-04 NOTE — Op Note (Signed)
[facility]  [address] [place], [id number]   COLONOSCOPY PROCEDURE REPORT  PATIENT: [name], [name]  MR#: [id number] BIRTHDATE: 03-23-1955 , 82  yrs. old GENDER: male ENDOSCOPIST: Jerene Bears, MD REFERRED BY:W.  Lutricia Feil, M.D. PROCEDURE DATE:  11/04/2013 PROCEDURE:   Colonoscopy with snare polypectomy First Screening Colonoscopy - Avg.  risk and is 50 yrs.  old or older Yes.  Prior Negative Screening - Now for repeat screening. N/A  History of Adenoma - Now for follow-up colonoscopy & has been > or = to 3 yrs.  N/A  Polyps Removed Today? Yes. ASA CLASS:   Class III INDICATIONS:average risk for colorectal cancer and first colonoscopy. MEDICATIONS: Monitored anesthesia care and Propofol 500 mg IV  DESCRIPTION OF PROCEDURE:   After the risks benefits and alternatives of the procedure were thoroughly explained, informed consent was obtained.  The digital rectal exam revealed no rectal mass.   The [id number]  endoscope was introduced through the anus and advanced to the cecum, which was identified by both the appendix and ileocecal valve. No adverse events experienced. The quality of the prep was Moviprep fair improving to good with irrigation and lavage. The instrument was then slowly withdrawn as the colon was fully examined.   COLON FINDINGS: Four sessile polyps ranging from 4 to 91mm in size were found in the ascending colon (1), at the cecum (1), and in the transverse colon (2).  Polypectomies were performed with a cold snare.  The resection was complete, the polyp tissue was completely retrieved and sent to histology.   There was moderate diverticulosis noted in the ascending colon, descending colon, and sigmoid colon.  Retroflexed views revealed no abnormalities. The time to cecum=7 minutes 38 seconds.  Withdrawal time=20 minutes 18 seconds.  The scope was withdrawn and the procedure completed. COMPLICATIONS: There were no immediate  complications.  ENDOSCOPIC IMPRESSION: 1.   Four sessile polyps ranging from 4 to 37mm in size were found in the ascending colon, at the cecum, and in the transverse colon; polypectomies were performed with a cold snare 2.   Moderate diverticulosis was noted in the ascending colon, descending colon, and sigmoid colon  RECOMMENDATIONS: 1.  Await pathology results 2.  High fiber diet 3.  If the polyps removed today are proven to be adenomatous (pre-cancerous) polyps, you will need a colonoscopy in 3 years. Otherwise you should continue to follow colorectal cancer screening guidelines for "routine risk" patients with a colonoscopy in 10 years.  You will receive a letter within 1-2 weeks with the results of your biopsy as well as final recommendations.  Please call my office if you have not received a letter after 3 weeks.  eSigned:  Jerene Bears, MD 11/04/2013 9:39 AM cc: The Patient, [name], MD

## 2013-11-04 NOTE — Progress Notes (Signed)
Called to room to assist during endoscopic procedure.  Patient ID and intended procedure confirmed with present staff. Received instructions for my participation in the procedure from the performing physician.  

## 2013-11-04 NOTE — Patient Instructions (Signed)
Impressions/recommendations:  Polyps (handout given) Diverticulosis (handout given) High fiber diet (handout given)  Repeat colonoscopy pending pathology results.  YOU HAD AN ENDOSCOPIC PROCEDURE TODAY AT THE [facility]: Refer to the procedure report that was given to you for any specific questions about what was found during the examination.  If the procedure report does not answer your questions, please call your gastroenterologist to clarify.  If you requested that your care partner not be given the details of your procedure findings, then the procedure report has been included in a sealed envelope for you to review at your convenience later.  YOU SHOULD EXPECT: Some feelings of bloating in the abdomen. Passage of more gas than usual.  Walking can help get rid of the air that was put into your GI tract during the procedure and reduce the bloating. If you had a lower endoscopy (such as a colonoscopy or flexible sigmoidoscopy) you may notice spotting of blood in your stool or on the toilet paper. If you underwent a bowel prep for your procedure, then you may not have a normal bowel movement for a few days.  DIET: Your first meal following the procedure should be a light meal and then it is ok to progress to your normal diet.  A half-sandwich or bowl of soup is an example of a good first meal.  Heavy or fried foods are harder to digest and may make you feel nauseous or bloated.  Likewise meals heavy in dairy and vegetables can cause extra gas to form and this can also increase the bloating.  Drink plenty of fluids but you should avoid alcoholic beverages for 24 hours.  ACTIVITY: Your care partner should take you home directly after the procedure.  You should plan to take it easy, moving slowly for the rest of the day.  You can resume normal activity the day after the procedure however you should NOT DRIVE or use heavy machinery for 24 hours (because of the sedation medicines used during  the test).    SYMPTOMS TO REPORT IMMEDIATELY: A gastroenterologist can be reached at any hour.  During normal business hours, 8:30 AM to 5:00 PM Monday through Friday, call [phone].  After hours and on weekends, please call the GI answering service at [phone] who will take a message and have the physician on call contact you.   Following lower endoscopy (colonoscopy or flexible sigmoidoscopy):  Excessive amounts of blood in the stool  Significant tenderness or worsening of abdominal pains  Swelling of the abdomen that is new, acute  Fever of 100F or higher  FOLLOW UP: If any biopsies were taken you will be contacted by phone or by letter within the next 1-3 weeks.  Call your gastroenterologist if you have not heard about the biopsies in 3 weeks.  Our staff will call the home number listed on your records the next business day following your procedure to check on you and address any questions or concerns that you may have at that time regarding the information given to you following your procedure. This is a courtesy call and so if there is no answer at the home number and we have not heard from you through the emergency physician on call, we will assume that you have returned to your regular daily activities without incident.  SIGNATURES/CONFIDENTIALITY: You and/or your care partner have signed paperwork which will be entered into your electronic medical record.  These signatures attest to the fact that that the information   above on your After Visit Summary has been reviewed and is understood.  Full responsibility of the confidentiality of this discharge information lies with you and/or your care-partner. 

## 2013-11-04 NOTE — Progress Notes (Signed)
Patient has port-a-cath to left chest wall.

## 2013-11-04 NOTE — Progress Notes (Signed)
Report to PACU, RN, vss, BBS= Clear.  

## 2013-11-05 ENCOUNTER — Telehealth: Payer: Self-pay

## 2013-11-05 NOTE — Telephone Encounter (Signed)
  Follow up Call-  Call back number 11/04/2013  Post procedure Call Back phone  # [phone]  Permission to leave phone message Yes     Patient questions:  Do you have a fever, pain , or abdominal swelling? No. Pain Score  0 *  Have you tolerated food without any problems? Yes.    Have you been able to return to your normal activities? Yes.    Do you have any questions about your discharge instructions: Diet   No. Medications  No. Follow up visit  No.  Do you have questions or concerns about your Care? No.  Actions: * If pain score is 4 or above: No action needed, pain <4.

## 2013-11-06 ENCOUNTER — Other Ambulatory Visit: Payer: Self-pay | Admitting: Nurse Practitioner

## 2013-11-08 ENCOUNTER — Encounter: Payer: Self-pay | Admitting: Internal Medicine

## 2014-03-19 ENCOUNTER — Telehealth: Payer: Self-pay | Admitting: Internal Medicine

## 2014-03-19 NOTE — Telephone Encounter (Signed)
pt called to r/s appt...done....pt ok and aware °

## 2014-03-24 ENCOUNTER — Other Ambulatory Visit: Payer: BC Managed Care – PPO

## 2014-03-24 ENCOUNTER — Ambulatory Visit: Payer: BC Managed Care – PPO | Admitting: Internal Medicine

## 2014-04-21 ENCOUNTER — Encounter: Payer: Self-pay | Admitting: Internal Medicine

## 2014-04-21 ENCOUNTER — Other Ambulatory Visit (HOSPITAL_BASED_OUTPATIENT_CLINIC_OR_DEPARTMENT_OTHER): Payer: BLUE CROSS/BLUE SHIELD

## 2014-04-21 ENCOUNTER — Telehealth: Payer: Self-pay | Admitting: Internal Medicine

## 2014-04-21 ENCOUNTER — Ambulatory Visit (HOSPITAL_BASED_OUTPATIENT_CLINIC_OR_DEPARTMENT_OTHER): Payer: BLUE CROSS/BLUE SHIELD | Admitting: Internal Medicine

## 2014-04-21 VITALS — BP 116/59 | HR 67 | Temp 97.9°F | Resp 18 | Ht 70.0 in | Wt 346.1 lb

## 2014-04-21 DIAGNOSIS — C8210 Follicular lymphoma grade II, unspecified site: Secondary | ICD-10-CM

## 2014-04-21 DIAGNOSIS — C8290 Follicular lymphoma, unspecified, unspecified site: Secondary | ICD-10-CM

## 2014-04-21 LAB — CBC WITH DIFFERENTIAL/PLATELET
BASO%: 1.3 % (ref 0.0–2.0)
Basophils Absolute: 0.1 10*3/uL (ref 0.0–0.1)
EOS%: 5 % (ref 0.0–7.0)
Eosinophils Absolute: 0.4 10*3/uL (ref 0.0–0.5)
HEMATOCRIT: 40.6 % (ref 38.4–49.9)
HGB: 13.1 g/dL (ref 13.0–17.1)
LYMPH%: 24.2 % (ref 14.0–49.0)
MCH: 29.6 pg (ref 27.2–33.4)
MCHC: 32.4 g/dL (ref 32.0–36.0)
MCV: 91.5 fL (ref 79.3–98.0)
MONO#: 0.6 10*3/uL (ref 0.1–0.9)
MONO%: 7 % (ref 0.0–14.0)
NEUT%: 62.5 % (ref 39.0–75.0)
NEUTROS ABS: 5.3 10*3/uL (ref 1.5–6.5)
PLATELETS: 221 10*3/uL (ref 140–400)
RBC: 4.44 10*6/uL (ref 4.20–5.82)
RDW: 15.9 % — ABNORMAL HIGH (ref 11.0–14.6)
WBC: 8.5 10*3/uL (ref 4.0–10.3)
lymph#: 2 10*3/uL (ref 0.9–3.3)

## 2014-04-21 LAB — COMPREHENSIVE METABOLIC PANEL (CC13)
ALT: 28 U/L (ref 0–55)
AST: 21 U/L (ref 5–34)
Albumin: 3.6 g/dL (ref 3.5–5.0)
Alkaline Phosphatase: 142 U/L (ref 40–150)
Anion Gap: 11 mEq/L (ref 3–11)
BUN: 17.5 mg/dL (ref 7.0–26.0)
CHLORIDE: 105 meq/L (ref 98–109)
CO2: 24 meq/L (ref 22–29)
Calcium: 9.2 mg/dL (ref 8.4–10.4)
Creatinine: 0.9 mg/dL (ref 0.7–1.3)
EGFR: >90 mL/min/{1.73_m2} (ref >90)
Glucose: 134 mg/dl (ref 70–140)
Potassium: 3.8 mEq/L (ref 3.5–5.1)
Sodium: 140 mEq/L (ref 136–145)
Total Bilirubin: 0.36 mg/dL (ref 0.20–1.20)
Total Protein: 6.5 g/dL (ref 6.4–8.3)

## 2014-04-21 LAB — LACTATE DEHYDROGENASE (CC13): LDH: 195 U/L (ref 125–245)

## 2014-04-21 NOTE — Addendum Note (Signed)
Addended by: Ardeen Garland on: 04/21/2014 03:22 PM   Modules accepted: Medications

## 2014-04-21 NOTE — Progress Notes (Signed)
[facility] Telephone:(336) [phone]   Fax:(336) [phone]  OFFICE PROGRESS NOTE  [name] Redwood, MD [place] [address]  DIAGNOSIS: : Stage III follicular lymphoma, grade 2, diagnosed in June 2012   PRIOR THERAPY:  1) Systemic chemotherapy with CHOP/Rituxan, status post 8 cycles, last dose was given 07/19/2010.  2)  Maintenance therapy with Rituxan 375 mg/m2 given every 2 months for a total of 2 years, status post 12 cycles.  CURRENT THERAPY: Observation  INTERVAL HISTORY: [name] 59 y.o. male returns to the clinic today for two-month followup visit. He has been observation for the last 12 months. The patient is feeling fine today with no specific complaints. He denied having any significant weight loss or night sweats. He denied having any chest pain, shortness of breath, cough or hemoptysis. He denied having any palpable lymphadenopathy. He had repeat CBC, comprehensive metabolic panel and LDH performed earlier today and he is here for evaluation and discussion of his lab results.  MEDICAL HISTORY: Past Medical History  Diagnosis Date  . Hypertension   . Venous insufficiency   . Dyslipidemia   . Gout attack 01/16/2012  . nhl dx'd 01/2010    ALLERGIES:  is allergic to penicillins.  MEDICATIONS:  Current Outpatient Prescriptions  Medication Sig Dispense Refill  . allopurinol (ZYLOPRIM) 100 MG tablet TAKE 1 TABLET BY MOUTH ONCE DAILY 90 tablet 0  . amLODipine (NORVASC) 5 MG tablet Take 5 mg by mouth daily.      [name] BENICAR HCT 40-25 MG per tablet     . carvedilol (COREG) 12.5 MG tablet 12.5 mg daily.    . Cholecalciferol (VITAMIN D-3 PO) Take 1 tablet by mouth daily. 500mg     . Ibuprofen (ADVIL) 200 MG CAPS Take by mouth as needed.      . multivitamin-iron-minerals-folic acid (CENTRUM) chewable tablet Chew 1 tablet by mouth daily.    . Omega-3 Fatty Acids (FISH OIL PO) Take 1 tablet by mouth daily.       No current facility-administered  medications for this visit.    REVIEW OF SYSTEMS:  Constitutional: negative Eyes: negative Ears, nose, mouth, throat, and face: negative Respiratory: negative Cardiovascular: negative Gastrointestinal: negative Genitourinary:negative Integument/breast: negative Hematologic/lymphatic: negative Musculoskeletal:negative Neurological: negative Behavioral/Psych: negative Endocrine: negative Allergic/Immunologic: negative   PHYSICAL EXAMINATION: General appearance: alert, cooperative and no distress Head: Normocephalic, without obvious abnormality, atraumatic Neck: no adenopathy Lymph nodes: Cervical, supraclavicular, and axillary nodes normal. Resp: clear to auscultation bilaterally Cardio: regular rate and rhythm, S1, S2 normal, no murmur, click, rub or gallop GI: soft, non-tender; bowel sounds normal; no masses,  no organomegaly Extremities: extremities normal, atraumatic, no cyanosis or edema Neurologic: Alert and oriented X 3, normal strength and tone. Normal symmetric reflexes. Normal coordination and gait  ECOG PERFORMANCE STATUS: 0 - Asymptomatic  Blood pressure 116/59, pulse 67, temperature 97.9 F (36.6 C), temperature source Oral, resp. rate 18, height 5\' 10"  (1.778 m), weight 346 lb 1.6 oz (156.99 kg), SpO2 100 %.  LABORATORY DATA: Lab Results  Component Value Date   WBC 8.5 04/21/2014   HGB 13.1 04/21/2014   HCT 40.6 04/21/2014   MCV 91.5 04/21/2014   PLT 221 04/21/2014      Chemistry      Component Value Date/Time   NA 138 07/29/2013 0813   NA 138 07/11/2011 1417   NA 138 05/16/2011 0858   K 4.1 07/29/2013 0813   K 4.2 07/11/2011 1417   K  4.3 05/16/2011 0858   CL 105 05/14/2012 0840   CL 96* 07/11/2011 1417   CL 102 05/16/2011 0858   CO2 26 07/29/2013 0813   CO2 26 07/11/2011 1417   CO2 22 05/16/2011 0858   BUN 19.2 07/29/2013 0813   BUN 18 07/11/2011 1417   BUN 22 05/16/2011 0858   CREATININE 0.9 07/29/2013 0813   CREATININE 1.1 07/11/2011 1417     CREATININE 1.04 05/16/2011 0858      Component Value Date/Time   CALCIUM 9.8 07/29/2013 0813   CALCIUM 8.4 07/11/2011 1417   CALCIUM 9.2 05/16/2011 0858   ALKPHOS 116 07/29/2013 0813   ALKPHOS 124* 07/11/2011 1417   ALKPHOS 119* 05/16/2011 0858   AST 21 07/29/2013 0813   AST 32 07/11/2011 1417   AST 23 05/16/2011 0858   ALT 27 07/29/2013 0813   ALT 35 07/11/2011 1417   ALT 28 05/16/2011 0858   BILITOT 0.37 07/29/2013 0813   BILITOT 0.50 07/11/2011 1417   BILITOT 0.4 05/16/2011 0858      RADIOLOGY STUDIES: No results found. ASSESSMENT AND PLAN: This is a very pleasant 59 years old white male with stage III follicular lymphoma currently on maintenance chemotherapy with Rituxan status post 12 cycles.  His CBC today was unremarkable. His comprehensive metabolic panel and LDH are still pending. I recommended for him to continue on observation with repeat  CBC, comprehensive metabolic panel and LDH in 6 months. We will order repeat scans only on as-needed basis for symptom evaluation. The patient was advised to call immediately if he has any concerning symptoms in the interval.  All questions were answered. The patient knows to call the clinic with any problems, questions or concerns. We can certainly see the patient much sooner if necessary.  Disclaimer: This note was dictated with voice recognition software. Similar sounding words can inadvertently be transcribed and may not be corrected upon review.

## 2014-04-21 NOTE — Telephone Encounter (Signed)
gave and printed appt sched and avs fo rpt for OCT. °

## 2014-09-08 DIAGNOSIS — M19049 Primary osteoarthritis, unspecified hand: Secondary | ICD-10-CM | POA: Insufficient documentation

## 2014-10-27 ENCOUNTER — Ambulatory Visit (HOSPITAL_BASED_OUTPATIENT_CLINIC_OR_DEPARTMENT_OTHER): Payer: BLUE CROSS/BLUE SHIELD | Admitting: Internal Medicine

## 2014-10-27 ENCOUNTER — Other Ambulatory Visit (HOSPITAL_BASED_OUTPATIENT_CLINIC_OR_DEPARTMENT_OTHER): Payer: BLUE CROSS/BLUE SHIELD

## 2014-10-27 ENCOUNTER — Telehealth: Payer: Self-pay | Admitting: Internal Medicine

## 2014-10-27 ENCOUNTER — Encounter: Payer: Self-pay | Admitting: Internal Medicine

## 2014-10-27 VITALS — BP 145/77 | HR 72 | Temp 98.2°F | Resp 20 | Ht 70.0 in | Wt 357.1 lb

## 2014-10-27 DIAGNOSIS — C8210 Follicular lymphoma grade II, unspecified site: Secondary | ICD-10-CM

## 2014-10-27 LAB — CBC WITH DIFFERENTIAL/PLATELET
BASO%: 1.3 % (ref 0.0–2.0)
Basophils Absolute: 0.1 10*3/uL (ref 0.0–0.1)
EOS ABS: 0.3 10*3/uL (ref 0.0–0.5)
EOS%: 4 % (ref 0.0–7.0)
HCT: 42.1 % (ref 38.4–49.9)
HEMOGLOBIN: 14.1 g/dL (ref 13.0–17.1)
LYMPH#: 1.9 10*3/uL (ref 0.9–3.3)
LYMPH%: 23.5 % (ref 14.0–49.0)
MCH: 31.1 pg (ref 27.2–33.4)
MCHC: 33.5 g/dL (ref 32.0–36.0)
MCV: 92.9 fL (ref 79.3–98.0)
MONO#: 0.9 10*3/uL (ref 0.1–0.9)
MONO%: 10.9 % (ref 0.0–14.0)
NEUT#: 4.9 10*3/uL (ref 1.5–6.5)
NEUT%: 60.3 % (ref 39.0–75.0)
Platelets: 166 10*3/uL (ref 140–400)
RBC: 4.53 10*6/uL (ref 4.20–5.82)
RDW: 16.7 % — AB (ref 11.0–14.6)
WBC: 8.1 10*3/uL (ref 4.0–10.3)

## 2014-10-27 LAB — COMPREHENSIVE METABOLIC PANEL (CC13)
ALBUMIN: 3.5 g/dL (ref 3.5–5.0)
ALK PHOS: 134 U/L (ref 40–150)
ALT: 31 U/L (ref 0–55)
AST: 25 U/L (ref 5–34)
Anion Gap: 8 mEq/L (ref 3–11)
BUN: 19.3 mg/dL (ref 7.0–26.0)
CO2: 24 mEq/L (ref 22–29)
CREATININE: 0.9 mg/dL (ref 0.7–1.3)
Calcium: 9.4 mg/dL (ref 8.4–10.4)
Chloride: 105 mEq/L (ref 98–109)
EGFR: >90 mL/min/{1.73_m2} (ref >90)
GLUCOSE: 115 mg/dL (ref 70–140)
Potassium: 3.7 mEq/L (ref 3.5–5.1)
SODIUM: 138 meq/L (ref 136–145)
Total Bilirubin: 0.35 mg/dL (ref 0.20–1.20)
Total Protein: 6.6 g/dL (ref 6.4–8.3)

## 2014-10-27 LAB — LACTATE DEHYDROGENASE (CC13): LDH: 195 U/L (ref 125–245)

## 2014-10-27 NOTE — Progress Notes (Signed)
[facility] Telephone:(336) [phone]   Fax:(336) [phone]  OFFICE PROGRESS NOTE  [name], MD [place] [address]  DIAGNOSIS: : Stage III follicular lymphoma, grade 2, diagnosed in June 2012   PRIOR THERAPY:  1) Systemic chemotherapy with CHOP/Rituxan, status post 8 cycles, last dose was given 07/19/2010.  2)  Maintenance therapy with Rituxan 375 mg/m2 given every 2 months for a total of 2 years, status post 12 cycles.  CURRENT THERAPY: Observation.  INTERVAL HISTORY: [name] 59 y.o. male returns to the clinic today for two-month followup visit. He has been observation for the last 18 months. The patient is feeling fine today with no specific complaints. He is still working full-time. He denied having any significant weight loss or night sweats. He denied having any chest pain, shortness of breath, cough or hemoptysis. He denied having any palpable lymphadenopathy. He had repeat CBC, comprehensive metabolic panel and LDH performed earlier today and he is here for evaluation and discussion of his lab results.  MEDICAL HISTORY: Past Medical History  Diagnosis Date  . Hypertension   . Venous insufficiency   . Dyslipidemia   . Gout attack 01/16/2012  . nhl dx'd 01/2010    ALLERGIES:  is allergic to penicillins.  MEDICATIONS:  Current Outpatient Prescriptions  Medication Sig Dispense Refill  . allopurinol (ZYLOPRIM) 100 MG tablet TAKE 1 TABLET BY MOUTH ONCE DAILY 90 tablet 0  . amLODipine (NORVASC) 5 MG tablet Take 5 mg by mouth daily.      [name] Kitchen atorvastatin (LIPITOR) 20 MG tablet   5  . BENICAR HCT 40-25 MG per tablet     . carvedilol (COREG) 12.5 MG tablet 12.5 mg daily.    . Cholecalciferol (VITAMIN D-3 PO) Take 1 tablet by mouth daily. 500mg     . clobetasol cream (TEMOVATE) 0.05 %   3  . Ibuprofen (ADVIL) 200 MG CAPS Take by mouth as needed.      . multivitamin-iron-minerals-folic acid (CENTRUM) chewable tablet Chew 1 tablet by  mouth daily.    . Omega-3 Fatty Acids (FISH OIL PO) Take 1 tablet by mouth daily.       No current facility-administered medications for this visit.    REVIEW OF SYSTEMS:  A comprehensive review of systems was negative.   PHYSICAL EXAMINATION: General appearance: alert, cooperative and no distress Head: Normocephalic, without obvious abnormality, atraumatic Neck: no adenopathy Lymph nodes: Cervical, supraclavicular, and axillary nodes normal. Resp: clear to auscultation bilaterally Cardio: regular rate and rhythm, S1, S2 normal, no murmur, click, rub or gallop GI: soft, non-tender; bowel sounds normal; no masses,  no organomegaly Extremities: extremities normal, atraumatic, no cyanosis or edema Neurologic: Alert and oriented X 3, normal strength and tone. Normal symmetric reflexes. Normal coordination and gait  ECOG PERFORMANCE STATUS: 0 - Asymptomatic  Blood pressure 145/77, pulse 72, temperature 98.2 F (36.8 C), temperature source Oral, resp. rate 20, height 5\' 10"  (1.778 m), weight 357 lb 1.6 oz (161.979 kg), SpO2 100 %.  LABORATORY DATA: Lab Results  Component Value Date   WBC 8.1 10/27/2014   HGB 14.1 10/27/2014   HCT 42.1 10/27/2014   MCV 92.9 10/27/2014   PLT 166 10/27/2014      Chemistry      Component Value Date/Time   NA 140 04/21/2014 1441   NA 138 07/11/2011 1417   NA 138 05/16/2011 0858   K 3.8 04/21/2014 1441   K 4.2 07/11/2011 1417  K 4.3 05/16/2011 0858   CL 105 05/14/2012 0840   CL 96* 07/11/2011 1417   CL 102 05/16/2011 0858   CO2 24 04/21/2014 1441   CO2 26 07/11/2011 1417   CO2 22 05/16/2011 0858   BUN 17.5 04/21/2014 1441   BUN 18 07/11/2011 1417   BUN 22 05/16/2011 0858   CREATININE 0.9 04/21/2014 1441   CREATININE 1.1 07/11/2011 1417   CREATININE 1.04 05/16/2011 0858      Component Value Date/Time   CALCIUM 9.2 04/21/2014 1441   CALCIUM 8.4 07/11/2011 1417   CALCIUM 9.2 05/16/2011 0858   ALKPHOS 142 04/21/2014 1441   ALKPHOS 124*  07/11/2011 1417   ALKPHOS 119* 05/16/2011 0858   AST 21 04/21/2014 1441   AST 32 07/11/2011 1417   AST 23 05/16/2011 0858   ALT 28 04/21/2014 1441   ALT 35 07/11/2011 1417   ALT 28 05/16/2011 0858   BILITOT 0.36 04/21/2014 1441   BILITOT 0.50 07/11/2011 1417   BILITOT 0.4 05/16/2011 0858      RADIOLOGY STUDIES: No results found. ASSESSMENT AND PLAN: This is a very pleasant 59 years old white male with stage III follicular lymphoma currently on maintenance chemotherapy with Rituxan status post 12 cycles.  His CBC, compliance metabolic panel and LDH today were unremarkable.  I recommended for him to continue on observation with repeat  CBC, comprehensive metabolic panel and LDH in 6 months. The patient will also have Port-A-Cath flush every 2 months. We will order repeat scans only on as-needed basis for symptom evaluation. The patient was advised to call immediately if he has any concerning symptoms in the interval.  All questions were answered. The patient knows to call the clinic with any problems, questions or concerns. We can certainly see the patient much sooner if necessary.  Disclaimer: This note was dictated with voice recognition software. Similar sounding words can inadvertently be transcribed and may not be corrected upon review.

## 2014-10-27 NOTE — Telephone Encounter (Signed)
per pof to sch pt appt-gave pt copy of avs °

## 2014-11-10 ENCOUNTER — Ambulatory Visit (HOSPITAL_BASED_OUTPATIENT_CLINIC_OR_DEPARTMENT_OTHER): Payer: BLUE CROSS/BLUE SHIELD

## 2014-11-10 DIAGNOSIS — C8210 Follicular lymphoma grade II, unspecified site: Secondary | ICD-10-CM

## 2014-11-10 DIAGNOSIS — Z452 Encounter for adjustment and management of vascular access device: Secondary | ICD-10-CM

## 2014-11-10 DIAGNOSIS — Z95828 Presence of other vascular implants and grafts: Secondary | ICD-10-CM

## 2014-11-10 MED ORDER — HEPARIN SOD (PORK) LOCK FLUSH 100 UNIT/ML IV SOLN
500.0000 [IU] | Freq: Once | INTRAVENOUS | Status: AC
  Administered 2014-11-10: 500 [IU] via INTRAVENOUS
  Filled 2014-11-10: qty 5

## 2014-11-10 MED ORDER — SODIUM CHLORIDE 0.9 % IJ SOLN
10.0000 mL | INTRAMUSCULAR | Status: DC | PRN
  Administered 2014-11-10: 10 mL via INTRAVENOUS
  Filled 2014-11-10: qty 10

## 2015-01-19 ENCOUNTER — Telehealth: Payer: Self-pay | Admitting: Internal Medicine

## 2015-01-19 NOTE — Telephone Encounter (Signed)
Patient had called in and left a message to cancel todays flush due to weather

## 2015-03-12 ENCOUNTER — Telehealth: Payer: Self-pay | Admitting: Internal Medicine

## 2015-03-12 NOTE — Telephone Encounter (Signed)
pt called to r/s appt...done....pt ok and aware of new d.t °

## 2015-03-16 ENCOUNTER — Ambulatory Visit (HOSPITAL_BASED_OUTPATIENT_CLINIC_OR_DEPARTMENT_OTHER): Payer: BLUE CROSS/BLUE SHIELD

## 2015-03-16 DIAGNOSIS — C8210 Follicular lymphoma grade II, unspecified site: Secondary | ICD-10-CM

## 2015-03-16 DIAGNOSIS — Z95828 Presence of other vascular implants and grafts: Secondary | ICD-10-CM

## 2015-03-16 DIAGNOSIS — Z452 Encounter for adjustment and management of vascular access device: Secondary | ICD-10-CM

## 2015-03-16 MED ORDER — HEPARIN SOD (PORK) LOCK FLUSH 100 UNIT/ML IV SOLN
500.0000 [IU] | Freq: Once | INTRAVENOUS | Status: AC
  Administered 2015-03-16: 500 [IU] via INTRAVENOUS
  Filled 2015-03-16: qty 5

## 2015-03-16 MED ORDER — SODIUM CHLORIDE 0.9% FLUSH
10.0000 mL | INTRAVENOUS | Status: DC | PRN
  Administered 2015-03-16: 10 mL via INTRAVENOUS
  Filled 2015-03-16: qty 10

## 2015-03-16 NOTE — Patient Instructions (Signed)

## 2015-04-13 ENCOUNTER — Telehealth: Payer: Self-pay | Admitting: Internal Medicine

## 2015-04-13 NOTE — Telephone Encounter (Signed)
s.w. pt and advised on md call day....pt ok to r/s for 4.24...[name] Kitchenpt ok and aware of new d.t

## 2015-04-27 ENCOUNTER — Other Ambulatory Visit: Payer: BLUE CROSS/BLUE SHIELD

## 2015-04-27 ENCOUNTER — Ambulatory Visit: Payer: BLUE CROSS/BLUE SHIELD | Admitting: Internal Medicine

## 2015-05-04 ENCOUNTER — Ambulatory Visit (HOSPITAL_BASED_OUTPATIENT_CLINIC_OR_DEPARTMENT_OTHER): Payer: BLUE CROSS/BLUE SHIELD | Admitting: Internal Medicine

## 2015-05-04 ENCOUNTER — Telehealth: Payer: Self-pay | Admitting: Internal Medicine

## 2015-05-04 ENCOUNTER — Encounter: Payer: Self-pay | Admitting: Internal Medicine

## 2015-05-04 ENCOUNTER — Other Ambulatory Visit (HOSPITAL_BASED_OUTPATIENT_CLINIC_OR_DEPARTMENT_OTHER): Payer: BLUE CROSS/BLUE SHIELD

## 2015-05-04 ENCOUNTER — Ambulatory Visit (HOSPITAL_BASED_OUTPATIENT_CLINIC_OR_DEPARTMENT_OTHER): Payer: BLUE CROSS/BLUE SHIELD

## 2015-05-04 VITALS — BP 156/91 | HR 84 | Temp 98.0°F | Resp 18 | Ht 70.0 in | Wt 362.6 lb

## 2015-05-04 DIAGNOSIS — C8210 Follicular lymphoma grade II, unspecified site: Secondary | ICD-10-CM

## 2015-05-04 DIAGNOSIS — Z95828 Presence of other vascular implants and grafts: Secondary | ICD-10-CM | POA: Insufficient documentation

## 2015-05-04 DIAGNOSIS — C8212 Follicular lymphoma grade II, intrathoracic lymph nodes: Secondary | ICD-10-CM

## 2015-05-04 LAB — CBC WITH DIFFERENTIAL/PLATELET
BASO%: 1 % (ref 0.0–2.0)
Basophils Absolute: 0.1 10*3/uL (ref 0.0–0.1)
EOS%: 4.8 % (ref 0.0–7.0)
Eosinophils Absolute: 0.4 10*3/uL (ref 0.0–0.5)
HCT: 43.6 % (ref 38.4–49.9)
HGB: 14.4 g/dL (ref 13.0–17.1)
LYMPH%: 25.1 % (ref 14.0–49.0)
MCH: 30.8 pg (ref 27.2–33.4)
MCHC: 33 g/dL (ref 32.0–36.0)
MCV: 93.2 fL (ref 79.3–98.0)
MONO#: 0.8 10*3/uL (ref 0.1–0.9)
MONO%: 9.9 % (ref 0.0–14.0)
NEUT#: 4.9 10*3/uL (ref 1.5–6.5)
NEUT%: 59.2 % (ref 39.0–75.0)
Platelets: 191 10*3/uL (ref 140–400)
RBC: 4.68 10*6/uL (ref 4.20–5.82)
RDW: 15.6 % — ABNORMAL HIGH (ref 11.0–14.6)
WBC: 8.4 10*3/uL (ref 4.0–10.3)
lymph#: 2.1 10*3/uL (ref 0.9–3.3)

## 2015-05-04 LAB — COMPREHENSIVE METABOLIC PANEL
ALBUMIN: 3.5 g/dL (ref 3.5–5.0)
ALK PHOS: 118 U/L (ref 40–150)
ALT: 27 U/L (ref 0–55)
AST: 29 U/L (ref 5–34)
Anion Gap: 9 mEq/L (ref 3–11)
BUN: 19.8 mg/dL (ref 7.0–26.0)
CO2: 24 mEq/L (ref 22–29)
Calcium: 9.4 mg/dL (ref 8.4–10.4)
Chloride: 105 mEq/L (ref 98–109)
Creatinine: 0.8 mg/dL (ref 0.7–1.3)
GLUCOSE: 89 mg/dL (ref 70–140)
POTASSIUM: 3.9 meq/L (ref 3.5–5.1)
SODIUM: 138 meq/L (ref 136–145)
Total Bilirubin: 0.56 mg/dL (ref 0.20–1.20)
Total Protein: 6.7 g/dL (ref 6.4–8.3)

## 2015-05-04 LAB — LACTATE DEHYDROGENASE: LDH: 203 U/L (ref 125–245)

## 2015-05-04 MED ORDER — HEPARIN SOD (PORK) LOCK FLUSH 100 UNIT/ML IV SOLN
500.0000 [IU] | Freq: Once | INTRAVENOUS | Status: AC | PRN
  Administered 2015-05-04: 500 [IU] via INTRAVENOUS
  Filled 2015-05-04: qty 5

## 2015-05-04 MED ORDER — SODIUM CHLORIDE 0.9 % IJ SOLN
10.0000 mL | INTRAMUSCULAR | Status: DC | PRN
  Administered 2015-05-04: 10 mL via INTRAVENOUS
  Filled 2015-05-04: qty 10

## 2015-05-04 NOTE — Telephone Encounter (Signed)
Gave patient avs report and appointments for June thru October. Added additional flush appointments as no other port flush appointments on schedule after today.

## 2015-05-04 NOTE — Progress Notes (Signed)
Orders only, patients labs were drawn by the lab

## 2015-05-04 NOTE — Progress Notes (Signed)
[facility] Telephone:(336) [phone]   Fax:(336) [phone]  OFFICE PROGRESS NOTE  [name], MD [place] [address]  DIAGNOSIS: : Stage III follicular lymphoma, grade 2, diagnosed in June 2012   PRIOR THERAPY:  1) Systemic chemotherapy with CHOP/Rituxan, status post 8 cycles, last dose was given 07/19/2010.  2)  Maintenance therapy with Rituxan 375 mg/m2 given every 2 months for a total of 2 years, status post 12 cycles.  CURRENT THERAPY: Observation.  INTERVAL HISTORY: [name] 60 y.o. male returns to the clinic today for six-month followup visit. He has been observation for the last 24 months. The patient is feeling fine today with no specific complaints. He is still working full-time in his [employer]. He denied having any significant weight loss or night sweats. He denied having any chest pain, shortness of breath, cough or hemoptysis. He denied having any palpable lymphadenopathy. He had repeat CBC, comprehensive metabolic panel and LDH performed earlier today and he is here for evaluation and discussion of his lab results.  MEDICAL HISTORY: Past Medical History  Diagnosis Date  . Hypertension   . Venous insufficiency   . Dyslipidemia   . Gout attack 01/16/2012  . nhl dx'd 01/2010    ALLERGIES:  is allergic to penicillins.  MEDICATIONS:  Current Outpatient Prescriptions  Medication Sig Dispense Refill  . allopurinol (ZYLOPRIM) 100 MG tablet TAKE 1 TABLET BY MOUTH ONCE DAILY 90 tablet 0  . amLODipine (NORVASC) 5 MG tablet Take 5 mg by mouth daily.      [name] Kitchen atorvastatin (LIPITOR) 20 MG tablet   5  . BENICAR HCT 40-25 MG per tablet     . carvedilol (COREG) 12.5 MG tablet 12.5 mg daily.    . Cholecalciferol (VITAMIN D-3 PO) Take 1 tablet by mouth daily. 500mg     . clobetasol cream (TEMOVATE) 0.05 %   3  . Ibuprofen (ADVIL) 200 MG CAPS Take by mouth as needed.      . multivitamin-iron-minerals-folic acid (CENTRUM) chewable tablet  Chew 1 tablet by mouth daily.    . Omega-3 Fatty Acids (FISH OIL PO) Take 1 tablet by mouth daily.       No current facility-administered medications for this visit.    REVIEW OF SYSTEMS:  A comprehensive review of systems was negative.   PHYSICAL EXAMINATION: General appearance: alert, cooperative and no distress Head: Normocephalic, without obvious abnormality, atraumatic Neck: no adenopathy Lymph nodes: Cervical, supraclavicular, and axillary nodes normal. Resp: clear to auscultation bilaterally Cardio: regular rate and rhythm, S1, S2 normal, no murmur, click, rub or gallop GI: soft, non-tender; bowel sounds normal; no masses,  no organomegaly Extremities: extremities normal, atraumatic, no cyanosis or edema Neurologic: Alert and oriented X 3, normal strength and tone. Normal symmetric reflexes. Normal coordination and gait  ECOG PERFORMANCE STATUS: 0 - Asymptomatic  Blood pressure 156/91, pulse 84, temperature 98 F (36.7 C), temperature source Oral, resp. rate 18, height 5\' 10"  (1.778 m), weight 362 lb 9.6 oz (164.474 kg), SpO2 100 %.  LABORATORY DATA: Lab Results  Component Value Date   WBC 8.4 05/04/2015   HGB 14.4 05/04/2015   HCT 43.6 05/04/2015   MCV 93.2 05/04/2015   PLT 191 05/04/2015      Chemistry      Component Value Date/Time   NA 138 05/04/2015 1437   NA 138 07/11/2011 1417   NA 138 05/16/2011 0858   K 3.9 05/04/2015 1437   K 4.2  07/11/2011 1417   K 4.3 05/16/2011 0858   CL 105 05/14/2012 0840   CL 96* 07/11/2011 1417   CL 102 05/16/2011 0858   CO2 24 05/04/2015 1437   CO2 26 07/11/2011 1417   CO2 22 05/16/2011 0858   BUN 19.8 05/04/2015 1437   BUN 18 07/11/2011 1417   BUN 22 05/16/2011 0858   CREATININE 0.8 05/04/2015 1437   CREATININE 1.1 07/11/2011 1417   CREATININE 1.04 05/16/2011 0858      Component Value Date/Time   CALCIUM 9.4 05/04/2015 1437   CALCIUM 8.4 07/11/2011 1417   CALCIUM 9.2 05/16/2011 0858   ALKPHOS 118 05/04/2015 1437    ALKPHOS 124* 07/11/2011 1417   ALKPHOS 119* 05/16/2011 0858   AST 29 05/04/2015 1437   AST 32 07/11/2011 1417   AST 23 05/16/2011 0858   ALT 27 05/04/2015 1437   ALT 35 07/11/2011 1417   ALT 28 05/16/2011 0858   BILITOT 0.56 05/04/2015 1437   BILITOT 0.50 07/11/2011 1417   BILITOT 0.4 05/16/2011 0858      RADIOLOGY STUDIES: No results found. ASSESSMENT AND PLAN: This is a very pleasant 60 years old white male with stage III follicular lymphoma currently on maintenance chemotherapy with Rituxan status post 12 cycles.  He has been observation for the last 2 years. His CBC, compliance metabolic panel and LDH today were unremarkable.  I recommended for him to continue on observation with repeat  CBC, comprehensive metabolic panel and LDH in 6 months. He will come back for follow-up visit at that time. The patient will also have Port-A-Cath flush every 2 months. We will order repeat scans only on as-needed basis for symptom evaluation. The patient was advised to call immediately if he has any concerning symptoms in the interval.  All questions were answered. The patient knows to call the clinic with any problems, questions or concerns. We can certainly see the patient much sooner if necessary.  Disclaimer: This note was dictated with voice recognition software. Similar sounding words can inadvertently be transcribed and may not be corrected upon review.

## 2015-05-04 NOTE — Addendum Note (Signed)
Addended by: Clifton James D on: 05/04/2015 03:33 PM   Modules accepted: Orders

## 2015-05-04 NOTE — Patient Instructions (Signed)

## 2015-07-27 ENCOUNTER — Ambulatory Visit (HOSPITAL_BASED_OUTPATIENT_CLINIC_OR_DEPARTMENT_OTHER): Payer: BLUE CROSS/BLUE SHIELD

## 2015-07-27 DIAGNOSIS — C8210 Follicular lymphoma grade II, unspecified site: Secondary | ICD-10-CM

## 2015-07-27 DIAGNOSIS — Z95828 Presence of other vascular implants and grafts: Secondary | ICD-10-CM

## 2015-07-27 DIAGNOSIS — Z452 Encounter for adjustment and management of vascular access device: Secondary | ICD-10-CM

## 2015-07-27 MED ORDER — SODIUM CHLORIDE 0.9 % IJ SOLN
10.0000 mL | INTRAMUSCULAR | Status: DC | PRN
  Administered 2015-07-27: 10 mL via INTRAVENOUS
  Filled 2015-07-27: qty 10

## 2015-07-27 MED ORDER — HEPARIN SOD (PORK) LOCK FLUSH 100 UNIT/ML IV SOLN
500.0000 [IU] | Freq: Once | INTRAVENOUS | Status: AC | PRN
  Administered 2015-07-27: 500 [IU] via INTRAVENOUS
  Filled 2015-07-27: qty 5

## 2015-07-27 NOTE — Patient Instructions (Signed)

## 2015-08-03 DIAGNOSIS — M129 Arthropathy, unspecified: Secondary | ICD-10-CM | POA: Diagnosis not present

## 2015-09-07 ENCOUNTER — Ambulatory Visit (HOSPITAL_BASED_OUTPATIENT_CLINIC_OR_DEPARTMENT_OTHER): Payer: BLUE CROSS/BLUE SHIELD

## 2015-09-07 DIAGNOSIS — C8210 Follicular lymphoma grade II, unspecified site: Secondary | ICD-10-CM

## 2015-09-07 DIAGNOSIS — Z452 Encounter for adjustment and management of vascular access device: Secondary | ICD-10-CM | POA: Diagnosis not present

## 2015-09-07 DIAGNOSIS — Z95828 Presence of other vascular implants and grafts: Secondary | ICD-10-CM

## 2015-09-07 MED ORDER — SODIUM CHLORIDE 0.9 % IJ SOLN
10.0000 mL | INTRAMUSCULAR | Status: DC | PRN
  Administered 2015-09-07: 10 mL via INTRAVENOUS
  Filled 2015-09-07: qty 10

## 2015-09-07 MED ORDER — HEPARIN SOD (PORK) LOCK FLUSH 100 UNIT/ML IV SOLN
500.0000 [IU] | Freq: Once | INTRAVENOUS | Status: AC | PRN
  Administered 2015-09-07: 500 [IU] via INTRAVENOUS
  Filled 2015-09-07: qty 5

## 2015-09-28 DIAGNOSIS — I10 Essential (primary) hypertension: Secondary | ICD-10-CM | POA: Diagnosis not present

## 2015-09-28 DIAGNOSIS — E784 Other hyperlipidemia: Secondary | ICD-10-CM | POA: Diagnosis not present

## 2015-09-28 DIAGNOSIS — R7301 Impaired fasting glucose: Secondary | ICD-10-CM | POA: Diagnosis not present

## 2015-09-28 DIAGNOSIS — Z125 Encounter for screening for malignant neoplasm of prostate: Secondary | ICD-10-CM | POA: Diagnosis not present

## 2015-09-28 DIAGNOSIS — M109 Gout, unspecified: Secondary | ICD-10-CM | POA: Diagnosis not present

## 2015-09-28 DIAGNOSIS — Z0000 Encounter for general adult medical examination without abnormal findings: Secondary | ICD-10-CM | POA: Diagnosis not present

## 2015-10-05 DIAGNOSIS — Z1389 Encounter for screening for other disorder: Secondary | ICD-10-CM | POA: Diagnosis not present

## 2015-10-05 DIAGNOSIS — I10 Essential (primary) hypertension: Secondary | ICD-10-CM | POA: Diagnosis not present

## 2015-10-05 DIAGNOSIS — R7301 Impaired fasting glucose: Secondary | ICD-10-CM | POA: Diagnosis not present

## 2015-10-05 DIAGNOSIS — E784 Other hyperlipidemia: Secondary | ICD-10-CM | POA: Diagnosis not present

## 2015-10-05 DIAGNOSIS — Z0000 Encounter for general adult medical examination without abnormal findings: Secondary | ICD-10-CM | POA: Diagnosis not present

## 2015-10-05 DIAGNOSIS — Z6841 Body Mass Index (BMI) 40.0 and over, adult: Secondary | ICD-10-CM | POA: Diagnosis not present

## 2015-10-05 DIAGNOSIS — I2510 Atherosclerotic heart disease of native coronary artery without angina pectoris: Secondary | ICD-10-CM | POA: Diagnosis not present

## 2015-10-05 DIAGNOSIS — I498 Other specified cardiac arrhythmias: Secondary | ICD-10-CM | POA: Diagnosis not present

## 2015-10-06 DIAGNOSIS — Z1212 Encounter for screening for malignant neoplasm of rectum: Secondary | ICD-10-CM | POA: Diagnosis not present

## 2015-10-16 ENCOUNTER — Other Ambulatory Visit (HOSPITAL_COMMUNITY): Payer: Self-pay | Admitting: Internal Medicine

## 2015-10-16 DIAGNOSIS — R600 Localized edema: Secondary | ICD-10-CM

## 2015-10-19 ENCOUNTER — Ambulatory Visit (HOSPITAL_COMMUNITY)
Admission: RE | Admit: 2015-10-19 | Discharge: 2015-10-19 | Disposition: A | Discharge status: Home / Self Care | Payer: BLUE CROSS/BLUE SHIELD | Source: Ambulatory Visit | Attending: Vascular Surgery | Admitting: Vascular Surgery

## 2015-10-19 DIAGNOSIS — R600 Localized edema: Secondary | ICD-10-CM | POA: Insufficient documentation

## 2015-11-02 ENCOUNTER — Ambulatory Visit (HOSPITAL_BASED_OUTPATIENT_CLINIC_OR_DEPARTMENT_OTHER): Payer: BLUE CROSS/BLUE SHIELD | Admitting: Internal Medicine

## 2015-11-02 ENCOUNTER — Encounter: Payer: Self-pay | Admitting: Internal Medicine

## 2015-11-02 ENCOUNTER — Other Ambulatory Visit (HOSPITAL_BASED_OUTPATIENT_CLINIC_OR_DEPARTMENT_OTHER): Payer: BLUE CROSS/BLUE SHIELD

## 2015-11-02 ENCOUNTER — Ambulatory Visit (HOSPITAL_BASED_OUTPATIENT_CLINIC_OR_DEPARTMENT_OTHER): Payer: BLUE CROSS/BLUE SHIELD

## 2015-11-02 VITALS — BP 127/92 | HR 92 | Temp 97.7°F | Resp 18 | Ht 70.0 in | Wt 367.9 lb

## 2015-11-02 DIAGNOSIS — C8210 Follicular lymphoma grade II, unspecified site: Secondary | ICD-10-CM

## 2015-11-02 DIAGNOSIS — D72829 Elevated white blood cell count, unspecified: Secondary | ICD-10-CM | POA: Diagnosis not present

## 2015-11-02 DIAGNOSIS — Z95828 Presence of other vascular implants and grafts: Secondary | ICD-10-CM

## 2015-11-02 DIAGNOSIS — C8212 Follicular lymphoma grade II, intrathoracic lymph nodes: Secondary | ICD-10-CM

## 2015-11-02 LAB — CBC WITH DIFFERENTIAL/PLATELET
BASO%: 1.1 % (ref 0.0–2.0)
Basophils Absolute: 0.1 10*3/uL (ref 0.0–0.1)
EOS%: 2.9 % (ref 0.0–7.0)
Eosinophils Absolute: 0.3 10*3/uL (ref 0.0–0.5)
HCT: 45.2 % (ref 38.4–49.9)
HEMOGLOBIN: 14.7 g/dL (ref 13.0–17.1)
LYMPH%: 18 % (ref 14.0–49.0)
MCH: 31.2 pg (ref 27.2–33.4)
MCHC: 32.5 g/dL (ref 32.0–36.0)
MCV: 95.8 fL (ref 79.3–98.0)
MONO#: 0.7 10*3/uL (ref 0.1–0.9)
MONO%: 7.2 % (ref 0.0–14.0)
NEUT%: 70.8 % (ref 39.0–75.0)
NEUTROS ABS: 7.3 10*3/uL — AB (ref 1.5–6.5)
Platelets: 228 10*3/uL (ref 140–400)
RBC: 4.72 10*6/uL (ref 4.20–5.82)
RDW: 15.4 % — AB (ref 11.0–14.6)
WBC: 10.4 10*3/uL — AB (ref 4.0–10.3)
lymph#: 1.9 10*3/uL (ref 0.9–3.3)

## 2015-11-02 LAB — COMPREHENSIVE METABOLIC PANEL
ALBUMIN: 3.4 g/dL — AB (ref 3.5–5.0)
ALK PHOS: 135 U/L (ref 40–150)
ALT: 33 U/L (ref 0–55)
AST: 26 U/L (ref 5–34)
Anion Gap: 10 mEq/L (ref 3–11)
BUN: 12.9 mg/dL (ref 7.0–26.0)
CO2: 24 meq/L (ref 22–29)
Calcium: 9.3 mg/dL (ref 8.4–10.4)
Chloride: 103 mEq/L (ref 98–109)
Creatinine: 0.9 mg/dL (ref 0.7–1.3)
EGFR: >90 mL/min/{1.73_m2} (ref >90)
GLUCOSE: 108 mg/dL (ref 70–140)
POTASSIUM: 3.7 meq/L (ref 3.5–5.1)
SODIUM: 138 meq/L (ref 136–145)
TOTAL PROTEIN: 6.8 g/dL (ref 6.4–8.3)
Total Bilirubin: 0.45 mg/dL (ref 0.20–1.20)

## 2015-11-02 LAB — LACTATE DEHYDROGENASE: LDH: 223 U/L (ref 125–245)

## 2015-11-02 MED ORDER — SODIUM CHLORIDE 0.9 % IJ SOLN
10.0000 mL | INTRAMUSCULAR | Status: DC | PRN
  Administered 2015-11-02: 10 mL via INTRAVENOUS
  Filled 2015-11-02: qty 10

## 2015-11-02 MED ORDER — HEPARIN SOD (PORK) LOCK FLUSH 100 UNIT/ML IV SOLN
500.0000 [IU] | Freq: Once | INTRAVENOUS | Status: AC | PRN
  Administered 2015-11-02: 500 [IU] via INTRAVENOUS
  Filled 2015-11-02: qty 5

## 2015-11-02 NOTE — Progress Notes (Signed)
[facility] Telephone:([phone]   Fax:(336) [phone]  OFFICE PROGRESS NOTE  [name], MD [place] [address]  DIAGNOSIS: : Stage III follicular lymphoma, grade 2, diagnosed in June 2012   PRIOR THERAPY:  1) Systemic chemotherapy with CHOP/Rituxan, status post 8 cycles, last dose was given 07/19/2010.  2)  Maintenance therapy with Rituxan 375 mg/m2 given every 2 months for a total of 2 years, status post 12 cycles.  CURRENT THERAPY: Observation.  INTERVAL HISTORY: [name] 60 y.o. male returns to the clinic today for six-month followup visit. He has been observation for more than 2 years. The patient is feeling fine today with no specific complaints. He had a swelling in his left lower extremity recently and Doppler was ordered by his primary care physician and showed no evidence of DVT. He denied having any significant weight loss or night sweats. He denied having any chest pain, shortness of breath, cough or hemoptysis. He denied having any palpable lymphadenopathy. He had repeat CBC, comprehensive metabolic panel and LDH performed earlier today and he is here for evaluation and discussion of his lab results.  MEDICAL HISTORY: Past Medical History:  Diagnosis Date  . Dyslipidemia   . Gout attack 01/16/2012  . Hypertension   . nhl dx'd 01/2010  . Venous insufficiency     ALLERGIES:  is allergic to penicillins.  MEDICATIONS:  Current Outpatient Prescriptions  Medication Sig Dispense Refill  . allopurinol (ZYLOPRIM) 100 MG tablet TAKE 1 TABLET BY MOUTH ONCE DAILY 90 tablet 0  . amLODipine (NORVASC) 5 MG tablet Take 5 mg by mouth daily.      [name] Kitchen atorvastatin (LIPITOR) 20 MG tablet   5  . BENICAR HCT 40-25 MG per tablet     . carvedilol (COREG) 12.5 MG tablet 12.5 mg daily.    . Cholecalciferol (VITAMIN D-3 PO) Take 1 tablet by mouth daily. 500mg     . clobetasol cream (TEMOVATE) 0.05 %   3  . Ibuprofen (ADVIL) 200 MG CAPS Take by  mouth as needed.      . multivitamin-iron-minerals-folic acid (CENTRUM) chewable tablet Chew 1 tablet by mouth daily.    . Omega-3 Fatty Acids (FISH OIL PO) Take 1 tablet by mouth daily.      [name] Kitchen CONTRAVE 8-90 MG TB12 Take 2 tablets by mouth 2 (two) times daily.  6   No current facility-administered medications for this visit.    Facility-Administered Medications Ordered in Other Visits  Medication Dose Route Frequency Provider Last Rate Last Dose  . sodium chloride 0.9 % injection 10 mL  10 mL Intravenous PRN Curt Bears, MD   10 mL at 05/04/15 1529    REVIEW OF SYSTEMS:  A comprehensive review of systems was negative.   PHYSICAL EXAMINATION: General appearance: alert, cooperative and no distress Head: Normocephalic, without obvious abnormality, atraumatic Neck: no adenopathy Lymph nodes: Cervical, supraclavicular, and axillary nodes normal. Resp: clear to auscultation bilaterally Cardio: regular rate and rhythm, S1, S2 normal, no murmur, click, rub or gallop GI: soft, non-tender; bowel sounds normal; no masses,  no organomegaly Extremities: extremities normal, atraumatic, no cyanosis or edema Neurologic: Alert and oriented X 3, normal strength and tone. Normal symmetric reflexes. Normal coordination and gait  ECOG PERFORMANCE STATUS: 0 - Asymptomatic  There were no vitals taken for this visit.  LABORATORY DATA: Lab Results  Component Value Date   WBC 10.4 (H) 11/02/2015   HGB 14.7 11/02/2015   HCT  45.2 11/02/2015   MCV 95.8 11/02/2015   PLT 228 11/02/2015      Chemistry      Component Value Date/Time   NA 138 05/04/2015 1437   K 3.9 05/04/2015 1437   CL 105 05/14/2012 0840   CO2 24 05/04/2015 1437   BUN 19.8 05/04/2015 1437   CREATININE 0.8 05/04/2015 1437      Component Value Date/Time   CALCIUM 9.4 05/04/2015 1437   ALKPHOS 118 05/04/2015 1437   AST 29 05/04/2015 1437   ALT 27 05/04/2015 1437   BILITOT 0.56 05/04/2015 1437      RADIOLOGY STUDIES: No  results found. ASSESSMENT AND PLAN: This is a very pleasant 60 years old white male with stage III follicular lymphoma currently on maintenance chemotherapy with Rituxan status post 12 cycles.  He has been observation for more than 2 years. CBC today is unremarkable except for mild leukocytosis. Comprehensive metabolic panel and LDH are still pending I recommended for him to continue on observation with repeat  CBC, comprehensive metabolic panel and LDH in 6 months. He will come back for follow-up visit at that time. We will order repeat scans only on as-needed basis for symptom evaluation. The patient was advised to call immediately if he has any concerning symptoms in the interval.  All questions were answered. The patient knows to call the clinic with any problems, questions or concerns. We can certainly see the patient much sooner if necessary.  Disclaimer: This note was dictated with voice recognition software. Similar sounding words can inadvertently be transcribed and may not be corrected upon review.

## 2015-11-09 ENCOUNTER — Telehealth: Payer: Self-pay | Admitting: Internal Medicine

## 2015-11-09 NOTE — Telephone Encounter (Signed)
Called patient re apirl appointments, however patent was driving and under the impression that all his appointments for flush and MM were scheduled thru next year.  Added flush appointments to schedule and mailed appointments for December thru April.

## 2015-12-13 DIAGNOSIS — M109 Gout, unspecified: Secondary | ICD-10-CM | POA: Diagnosis not present

## 2015-12-14 ENCOUNTER — Ambulatory Visit (HOSPITAL_BASED_OUTPATIENT_CLINIC_OR_DEPARTMENT_OTHER): Payer: BLUE CROSS/BLUE SHIELD

## 2015-12-14 DIAGNOSIS — C8210 Follicular lymphoma grade II, unspecified site: Secondary | ICD-10-CM | POA: Diagnosis not present

## 2015-12-14 DIAGNOSIS — I10 Essential (primary) hypertension: Secondary | ICD-10-CM | POA: Diagnosis not present

## 2015-12-14 DIAGNOSIS — Z452 Encounter for adjustment and management of vascular access device: Secondary | ICD-10-CM

## 2015-12-14 DIAGNOSIS — Z95828 Presence of other vascular implants and grafts: Secondary | ICD-10-CM

## 2015-12-14 DIAGNOSIS — Z6841 Body Mass Index (BMI) 40.0 and over, adult: Secondary | ICD-10-CM | POA: Diagnosis not present

## 2015-12-14 MED ORDER — SODIUM CHLORIDE 0.9 % IJ SOLN
10.0000 mL | INTRAMUSCULAR | Status: DC | PRN
  Administered 2015-12-14: 10 mL via INTRAVENOUS
  Filled 2015-12-14: qty 10

## 2015-12-14 MED ORDER — HEPARIN SOD (PORK) LOCK FLUSH 100 UNIT/ML IV SOLN
500.0000 [IU] | Freq: Once | INTRAVENOUS | Status: AC | PRN
  Administered 2015-12-14: 500 [IU] via INTRAVENOUS
  Filled 2015-12-14: qty 5

## 2015-12-14 NOTE — Patient Instructions (Signed)

## 2016-01-25 ENCOUNTER — Ambulatory Visit (HOSPITAL_BASED_OUTPATIENT_CLINIC_OR_DEPARTMENT_OTHER): Payer: BLUE CROSS/BLUE SHIELD

## 2016-01-25 VITALS — BP 145/84 | HR 84 | Resp 19

## 2016-01-25 DIAGNOSIS — C8210 Follicular lymphoma grade II, unspecified site: Secondary | ICD-10-CM | POA: Diagnosis not present

## 2016-01-25 DIAGNOSIS — Z452 Encounter for adjustment and management of vascular access device: Secondary | ICD-10-CM | POA: Diagnosis not present

## 2016-01-25 DIAGNOSIS — Z95828 Presence of other vascular implants and grafts: Secondary | ICD-10-CM

## 2016-01-25 MED ORDER — HEPARIN SOD (PORK) LOCK FLUSH 100 UNIT/ML IV SOLN
500.0000 [IU] | Freq: Once | INTRAVENOUS | Status: AC | PRN
  Administered 2016-01-25: 500 [IU] via INTRAVENOUS
  Filled 2016-01-25: qty 5

## 2016-01-25 MED ORDER — SODIUM CHLORIDE 0.9 % IJ SOLN
10.0000 mL | INTRAMUSCULAR | Status: DC | PRN
  Administered 2016-01-25: 10 mL via INTRAVENOUS
  Filled 2016-01-25: qty 10

## 2016-05-02 ENCOUNTER — Ambulatory Visit (HOSPITAL_BASED_OUTPATIENT_CLINIC_OR_DEPARTMENT_OTHER): Payer: BLUE CROSS/BLUE SHIELD | Admitting: Internal Medicine

## 2016-05-02 ENCOUNTER — Encounter: Payer: Self-pay | Admitting: Internal Medicine

## 2016-05-02 ENCOUNTER — Telehealth: Payer: Self-pay | Admitting: Internal Medicine

## 2016-05-02 ENCOUNTER — Other Ambulatory Visit (HOSPITAL_BASED_OUTPATIENT_CLINIC_OR_DEPARTMENT_OTHER): Payer: BLUE CROSS/BLUE SHIELD

## 2016-05-02 ENCOUNTER — Ambulatory Visit (HOSPITAL_BASED_OUTPATIENT_CLINIC_OR_DEPARTMENT_OTHER): Payer: BLUE CROSS/BLUE SHIELD

## 2016-05-02 VITALS — BP 155/87 | HR 52 | Temp 98.1°F | Resp 19 | Ht 70.0 in | Wt 360.1 lb

## 2016-05-02 DIAGNOSIS — C8212 Follicular lymphoma grade II, intrathoracic lymph nodes: Secondary | ICD-10-CM

## 2016-05-02 DIAGNOSIS — C8210 Follicular lymphoma grade II, unspecified site: Secondary | ICD-10-CM | POA: Diagnosis not present

## 2016-05-02 DIAGNOSIS — Z95828 Presence of other vascular implants and grafts: Secondary | ICD-10-CM

## 2016-05-02 LAB — COMPREHENSIVE METABOLIC PANEL
ALBUMIN: 3.5 g/dL (ref 3.5–5.0)
ALK PHOS: 125 U/L (ref 40–150)
ALT: 29 U/L (ref 0–55)
AST: 27 U/L (ref 5–34)
Anion Gap: 10 mEq/L (ref 3–11)
BUN: 18.3 mg/dL (ref 7.0–26.0)
CALCIUM: 9.2 mg/dL (ref 8.4–10.4)
CHLORIDE: 103 meq/L (ref 98–109)
CO2: 26 mEq/L (ref 22–29)
Creatinine: 0.9 mg/dL (ref 0.7–1.3)
GLUCOSE: 91 mg/dL (ref 70–140)
POTASSIUM: 3.9 meq/L (ref 3.5–5.1)
SODIUM: 139 meq/L (ref 136–145)
Total Bilirubin: 0.44 mg/dL (ref 0.20–1.20)
Total Protein: 6.7 g/dL (ref 6.4–8.3)

## 2016-05-02 LAB — CBC WITH DIFFERENTIAL/PLATELET
BASO%: 1.4 % (ref 0.0–2.0)
BASOS ABS: 0.1 10*3/uL (ref 0.0–0.1)
EOS%: 3.7 % (ref 0.0–7.0)
Eosinophils Absolute: 0.3 10*3/uL (ref 0.0–0.5)
HCT: 47.3 % (ref 38.4–49.9)
HEMOGLOBIN: 15.7 g/dL (ref 13.0–17.1)
LYMPH%: 22.2 % (ref 14.0–49.0)
MCH: 30.7 pg (ref 27.2–33.4)
MCHC: 33.1 g/dL (ref 32.0–36.0)
MCV: 92.7 fL (ref 79.3–98.0)
MONO#: 0.7 10*3/uL (ref 0.1–0.9)
MONO%: 8.9 % (ref 0.0–14.0)
NEUT#: 5.2 10*3/uL (ref 1.5–6.5)
NEUT%: 63.8 % (ref 39.0–75.0)
Platelets: 171 10*3/uL (ref 140–400)
RBC: 5.11 10*6/uL (ref 4.20–5.82)
RDW: 16.5 % — AB (ref 11.0–14.6)
WBC: 8.2 10*3/uL (ref 4.0–10.3)
lymph#: 1.8 10*3/uL (ref 0.9–3.3)

## 2016-05-02 LAB — LACTATE DEHYDROGENASE: LDH: 207 U/L (ref 125–245)

## 2016-05-02 MED ORDER — HEPARIN SOD (PORK) LOCK FLUSH 100 UNIT/ML IV SOLN
500.0000 [IU] | Freq: Once | INTRAVENOUS | Status: AC | PRN
  Administered 2016-05-02: 500 [IU] via INTRAVENOUS
  Filled 2016-05-02: qty 5

## 2016-05-02 MED ORDER — SODIUM CHLORIDE 0.9 % IJ SOLN
10.0000 mL | INTRAMUSCULAR | Status: DC | PRN
  Administered 2016-05-02: 10 mL via INTRAVENOUS
  Filled 2016-05-02: qty 10

## 2016-05-02 NOTE — Telephone Encounter (Signed)
Appointments scheduled per 05/02/16 los. Patient was given a copy of the AVS report and appointment schedule, per 05/02/16 los. °

## 2016-05-02 NOTE — Telephone Encounter (Signed)
2 bottles of contrast given to patient with a copy of the instructions.

## 2016-05-02 NOTE — Progress Notes (Signed)
[facility] Telephone:(336) [phone]   Fax:(336) [phone]  OFFICE PROGRESS NOTE  [name], MD [place] [address]  DIAGNOSIS: : Stage III follicular lymphoma, grade 2, diagnosed in June 2012   PRIOR THERAPY:  1) Systemic chemotherapy with CHOP/Rituxan, status post 8 cycles, last dose was given 07/19/2010.  2)  Maintenance therapy with Rituxan 375 mg/m2 given every 2 months for a total of 2 years, status post 12 cycles.  CURRENT THERAPY: Observation.  INTERVAL HISTORY: [name] 62 y.o. male returns to the clinic today for six-month follow-up visit. The patient is feeling fine today and has no complaints. He denied having any chest pain, shortness of breath, cough or hemoptysis. He denied having any weight loss or night sweats. He has no fever or chills. He has no nausea or vomiting. He is here today for evaluation and repeat blood work.  MEDICAL HISTORY: Past Medical History:  Diagnosis Date  . Dyslipidemia   . Gout attack 01/16/2012  . Hypertension   . nhl dx'd 01/2010  . Venous insufficiency     ALLERGIES:  is allergic to penicillins.  MEDICATIONS:  Current Outpatient Prescriptions  Medication Sig Dispense Refill  . allopurinol (ZYLOPRIM) 100 MG tablet TAKE 1 TABLET BY MOUTH ONCE DAILY 90 tablet 0  . allopurinol (ZYLOPRIM) 300 MG tablet Take 300 mg by mouth daily.  2  . amLODipine (NORVASC) 5 MG tablet Take 5 mg by mouth daily.      [name] Kitchen atorvastatin (LIPITOR) 20 MG tablet   5  . BENICAR HCT 40-25 MG per tablet     . carvedilol (COREG) 12.5 MG tablet 12.5 mg daily.    . Cholecalciferol (VITAMIN D-3 PO) Take 1 tablet by mouth daily. 500mg     . clobetasol cream (TEMOVATE) 0.05 %   3  . CONTRAVE 8-90 MG TB12 Take 2 tablets by mouth 2 (two) times daily.  6  . Ibuprofen (ADVIL) 200 MG CAPS Take by mouth as needed.      . multivitamin-iron-minerals-folic acid (CENTRUM) chewable tablet Chew 1 tablet by mouth daily.    . Omega-3 Fatty  Acids (FISH OIL PO) Take 1 tablet by mouth daily.       No current facility-administered medications for this visit.    Facility-Administered Medications Ordered in Other Visits  Medication Dose Route Frequency Provider Last Rate Last Dose  . sodium chloride 0.9 % injection 10 mL  10 mL Intravenous PRN Curt Bears, MD   10 mL at 05/04/15 1529    REVIEW OF SYSTEMS:  A comprehensive review of systems was negative.   PHYSICAL EXAMINATION: General appearance: alert, cooperative and no distress Head: Normocephalic, without obvious abnormality, atraumatic Neck: no adenopathy Lymph nodes: Cervical, supraclavicular, and axillary nodes normal. Resp: clear to auscultation bilaterally Back: symmetric, no curvature. ROM normal. No CVA tenderness. Cardio: regular rate and rhythm, S1, S2 normal, no murmur, click, rub or gallop GI: soft, non-tender; bowel sounds normal; no masses,  no organomegaly Extremities: extremities normal, atraumatic, no cyanosis or edema  ECOG PERFORMANCE STATUS: 0 - Asymptomatic  Blood pressure (!) 155/87, pulse (!) 52, temperature 98.1 F (36.7 C), temperature source Oral, resp. rate 19, height 5\' 10"  (1.778 m), weight (!) 360 lb 1.6 oz (163.3 kg), SpO2 100 %.  LABORATORY DATA: Lab Results  Component Value Date   WBC 8.2 05/02/2016   HGB 15.7 05/02/2016   HCT 47.3 05/02/2016   MCV 92.7 05/02/2016   PLT 171  05/02/2016      Chemistry      Component Value Date/Time   NA 139 05/02/2016 1339   K 3.9 05/02/2016 1339   CL 105 05/14/2012 0840   CO2 26 05/02/2016 1339   BUN 18.3 05/02/2016 1339   CREATININE 0.9 05/02/2016 1339      Component Value Date/Time   CALCIUM 9.2 05/02/2016 1339   ALKPHOS 125 05/02/2016 1339   AST 27 05/02/2016 1339   ALT 29 05/02/2016 1339   BILITOT 0.44 05/02/2016 1339      RADIOLOGY STUDIES: No results found. ASSESSMENT AND PLAN:  This is a very pleasant 61 years old white male with a stage IIIa follicular lymphoma status  post treatment with CHOP/Rituxan followed by maintenance Rituxan for 2 years and has been observation for close to 3 years now. The patient is doing fine and no specific complaints. His lab work is unremarkable today. I recommended for the patient to continue on observation with repeat lab work and CT scan of the chest, abdomen and pelvis in 6 months. He was advised to call immediately if he has any concerning symptoms in the interval. All questions were answered. The patient knows to call the clinic with any problems, questions or concerns. We can certainly see the patient much sooner if necessary.  Disclaimer: This note was dictated with voice recognition software. Similar sounding words can inadvertently be transcribed and may not be corrected upon review.

## 2016-05-03 ENCOUNTER — Ambulatory Visit: Payer: BLUE CROSS/BLUE SHIELD | Admitting: Internal Medicine

## 2016-05-03 ENCOUNTER — Other Ambulatory Visit: Payer: BLUE CROSS/BLUE SHIELD

## 2016-08-22 ENCOUNTER — Telehealth: Payer: Self-pay

## 2016-08-22 NOTE — Telephone Encounter (Signed)
Called and left a message with flush appts per 8/13 inbasket message  Taffy Delconte

## 2016-08-29 ENCOUNTER — Ambulatory Visit (HOSPITAL_BASED_OUTPATIENT_CLINIC_OR_DEPARTMENT_OTHER): Payer: BLUE CROSS/BLUE SHIELD

## 2016-08-29 DIAGNOSIS — C8210 Follicular lymphoma grade II, unspecified site: Secondary | ICD-10-CM | POA: Diagnosis not present

## 2016-08-29 DIAGNOSIS — Z452 Encounter for adjustment and management of vascular access device: Secondary | ICD-10-CM

## 2016-08-29 DIAGNOSIS — Z95828 Presence of other vascular implants and grafts: Secondary | ICD-10-CM

## 2016-08-29 MED ORDER — HEPARIN SOD (PORK) LOCK FLUSH 100 UNIT/ML IV SOLN
500.0000 [IU] | Freq: Once | INTRAVENOUS | Status: AC | PRN
  Administered 2016-08-29: 500 [IU] via INTRAVENOUS
  Filled 2016-08-29: qty 5

## 2016-08-29 MED ORDER — SODIUM CHLORIDE 0.9 % IJ SOLN
10.0000 mL | INTRAMUSCULAR | Status: DC | PRN
  Administered 2016-08-29: 10 mL via INTRAVENOUS
  Filled 2016-08-29: qty 10

## 2016-08-29 NOTE — Patient Instructions (Signed)

## 2016-08-30 ENCOUNTER — Telehealth: Payer: Self-pay | Admitting: Internal Medicine

## 2016-08-30 NOTE — Telephone Encounter (Signed)
Call day moved from 10/22-10/29 patient was informed asked for mondays only

## 2016-09-08 ENCOUNTER — Telehealth: Payer: Self-pay | Admitting: *Deleted

## 2016-09-08 NOTE — Telephone Encounter (Signed)
Dr Hilarie Fredrickson has reviewed patient's chart and states that patient is due for recall colonoscopy 10/2016 due to his previous history of adenomatous polyps in 2015. He had a "fair to good" prep with copious irrigation using Moviprep, therefore he will need a 2 day prep. Patient will also need to have procedure completed at the hospital due to last documented BMI being >50. I have contacted that patient to advise of Dr Vena Rua recommendation and to schedule him for colonoscopy in October at the hospital. However, patient is unable to have procedure until November because he states "October is completely booked right now." I advised that I do not have any dates available for November to choose from at this time but that I would contact him when these become available. He thanked me for my call and agrees with this plan.

## 2016-10-03 DIAGNOSIS — I10 Essential (primary) hypertension: Secondary | ICD-10-CM | POA: Diagnosis not present

## 2016-10-03 DIAGNOSIS — R7301 Impaired fasting glucose: Secondary | ICD-10-CM | POA: Diagnosis not present

## 2016-10-03 DIAGNOSIS — M109 Gout, unspecified: Secondary | ICD-10-CM | POA: Diagnosis not present

## 2016-10-03 DIAGNOSIS — Z125 Encounter for screening for malignant neoplasm of prostate: Secondary | ICD-10-CM | POA: Diagnosis not present

## 2016-10-03 DIAGNOSIS — E784 Other hyperlipidemia: Secondary | ICD-10-CM | POA: Diagnosis not present

## 2016-10-10 DIAGNOSIS — E7849 Other hyperlipidemia: Secondary | ICD-10-CM | POA: Diagnosis not present

## 2016-10-10 DIAGNOSIS — I10 Essential (primary) hypertension: Secondary | ICD-10-CM | POA: Diagnosis not present

## 2016-10-10 DIAGNOSIS — Z0000 Encounter for general adult medical examination without abnormal findings: Secondary | ICD-10-CM | POA: Diagnosis not present

## 2016-10-10 DIAGNOSIS — R7301 Impaired fasting glucose: Secondary | ICD-10-CM | POA: Diagnosis not present

## 2016-10-10 DIAGNOSIS — I2510 Atherosclerotic heart disease of native coronary artery without angina pectoris: Secondary | ICD-10-CM | POA: Diagnosis not present

## 2016-10-10 DIAGNOSIS — Z1389 Encounter for screening for other disorder: Secondary | ICD-10-CM | POA: Diagnosis not present

## 2016-10-10 DIAGNOSIS — I872 Venous insufficiency (chronic) (peripheral): Secondary | ICD-10-CM | POA: Diagnosis not present

## 2016-10-11 NOTE — Telephone Encounter (Signed)
Left voicemail for patient to call back regarding scheduling colonoscopy. I have availability at the hospital on 12/09/16 and 12/30/16 currently.

## 2016-10-12 DIAGNOSIS — Z1212 Encounter for screening for malignant neoplasm of rectum: Secondary | ICD-10-CM | POA: Diagnosis not present

## 2016-10-13 NOTE — Telephone Encounter (Signed)
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 I have advised the patient that at this time, I will have him to call us when he gets availability on his calender (this is the second time we have contacted him to schedule). However, I reminded him that there will be limited availability on our part as his procedure has to be completed at the hospital due to BMI >50. If he is able to get BMI down, we may be able to work around his schedule quite a bit more as it would then be possible to have his procedure completed in our endoscopy suite. Patient verbalizes understanding and states he will call us back.

## 2016-10-13 NOTE — Telephone Encounter (Signed)
Patient returned phone call. Best # [phone]

## 2016-10-19 ENCOUNTER — Telehealth: Payer: Self-pay | Admitting: Medical Oncology

## 2016-10-19 NOTE — Telephone Encounter (Signed)
Cannot afford copay for scan. Per [name] if pt feels well he can just do blood work. Pt staed he feels great and he will just do blood work.

## 2016-10-24 ENCOUNTER — Other Ambulatory Visit: Payer: BLUE CROSS/BLUE SHIELD

## 2016-10-24 ENCOUNTER — Ambulatory Visit (HOSPITAL_COMMUNITY): Payer: BLUE CROSS/BLUE SHIELD

## 2016-10-24 ENCOUNTER — Other Ambulatory Visit (HOSPITAL_BASED_OUTPATIENT_CLINIC_OR_DEPARTMENT_OTHER): Payer: BLUE CROSS/BLUE SHIELD

## 2016-10-24 ENCOUNTER — Ambulatory Visit (HOSPITAL_BASED_OUTPATIENT_CLINIC_OR_DEPARTMENT_OTHER): Payer: BLUE CROSS/BLUE SHIELD

## 2016-10-24 DIAGNOSIS — C8210 Follicular lymphoma grade II, unspecified site: Secondary | ICD-10-CM | POA: Diagnosis not present

## 2016-10-24 DIAGNOSIS — Z95828 Presence of other vascular implants and grafts: Secondary | ICD-10-CM

## 2016-10-24 DIAGNOSIS — C8212 Follicular lymphoma grade II, intrathoracic lymph nodes: Secondary | ICD-10-CM

## 2016-10-24 LAB — COMPREHENSIVE METABOLIC PANEL
ALT: 31 U/L (ref 0–55)
ANION GAP: 9 meq/L (ref 3–11)
AST: 31 U/L (ref 5–34)
Albumin: 3.4 g/dL — ABNORMAL LOW (ref 3.5–5.0)
Alkaline Phosphatase: 131 U/L (ref 40–150)
BILIRUBIN TOTAL: 0.61 mg/dL (ref 0.20–1.20)
BUN: 16.2 mg/dL (ref 7.0–26.0)
CALCIUM: 9.2 mg/dL (ref 8.4–10.4)
CHLORIDE: 102 meq/L (ref 98–109)
CO2: 26 meq/L (ref 22–29)
CREATININE: 0.8 mg/dL (ref 0.7–1.3)
EGFR: >60 mL/min/{1.73_m2} (ref >60)
Glucose: 94 mg/dl (ref 70–140)
Potassium: 3.6 mEq/L (ref 3.5–5.1)
Sodium: 137 mEq/L (ref 136–145)
TOTAL PROTEIN: 6.9 g/dL (ref 6.4–8.3)

## 2016-10-24 LAB — CBC WITH DIFFERENTIAL/PLATELET
BASO%: 0.9 % (ref 0.0–2.0)
BASOS ABS: 0.1 10*3/uL (ref 0.0–0.1)
EOS ABS: 0.3 10*3/uL (ref 0.0–0.5)
EOS%: 3.8 % (ref 0.0–7.0)
HCT: 43.8 % (ref 38.4–49.9)
HGB: 14.5 g/dL (ref 13.0–17.1)
LYMPH%: 22.3 % (ref 14.0–49.0)
MCH: 30.6 pg (ref 27.2–33.4)
MCHC: 33.1 g/dL (ref 32.0–36.0)
MCV: 92.4 fL (ref 79.3–98.0)
MONO#: 0.9 10*3/uL (ref 0.1–0.9)
MONO%: 11.9 % (ref 0.0–14.0)
NEUT#: 4.8 10*3/uL (ref 1.5–6.5)
NEUT%: 61.1 % (ref 39.0–75.0)
PLATELETS: 177 10*3/uL (ref 140–400)
RBC: 4.74 10*6/uL (ref 4.20–5.82)
RDW: 15.2 % — AB (ref 11.0–14.6)
WBC: 7.8 10*3/uL (ref 4.0–10.3)
lymph#: 1.7 10*3/uL (ref 0.9–3.3)
nRBC: 0 % (ref 0–0)

## 2016-10-24 LAB — LACTATE DEHYDROGENASE: LDH: 226 U/L (ref 125–245)

## 2016-10-24 MED ORDER — SODIUM CHLORIDE 0.9 % IJ SOLN
10.0000 mL | INTRAMUSCULAR | Status: DC | PRN
  Administered 2016-10-24: 10 mL via INTRAVENOUS
  Filled 2016-10-24: qty 10

## 2016-10-24 MED ORDER — HEPARIN SOD (PORK) LOCK FLUSH 100 UNIT/ML IV SOLN
500.0000 [IU] | Freq: Once | INTRAVENOUS | Status: AC | PRN
  Administered 2016-10-24: 500 [IU] via INTRAVENOUS
  Filled 2016-10-24: qty 5

## 2016-10-29 DIAGNOSIS — Z8579 Personal history of other malignant neoplasms of lymphoid, hematopoietic and related tissues: Secondary | ICD-10-CM | POA: Diagnosis not present

## 2016-10-29 DIAGNOSIS — M109 Gout, unspecified: Secondary | ICD-10-CM | POA: Diagnosis not present

## 2016-10-31 ENCOUNTER — Ambulatory Visit: Payer: BLUE CROSS/BLUE SHIELD | Admitting: Internal Medicine

## 2016-11-01 ENCOUNTER — Ambulatory Visit: Payer: BLUE CROSS/BLUE SHIELD | Admitting: Internal Medicine

## 2016-11-07 ENCOUNTER — Ambulatory Visit (HOSPITAL_BASED_OUTPATIENT_CLINIC_OR_DEPARTMENT_OTHER): Payer: BLUE CROSS/BLUE SHIELD | Admitting: Internal Medicine

## 2016-11-07 ENCOUNTER — Encounter: Payer: Self-pay | Admitting: Internal Medicine

## 2016-11-07 ENCOUNTER — Telehealth: Payer: Self-pay | Admitting: Internal Medicine

## 2016-11-07 VITALS — BP 139/99 | HR 90 | Temp 97.7°F | Resp 18 | Ht 70.0 in | Wt <= 1120 oz

## 2016-11-07 DIAGNOSIS — C8210 Follicular lymphoma grade II, unspecified site: Secondary | ICD-10-CM | POA: Diagnosis not present

## 2016-11-07 DIAGNOSIS — C8212 Follicular lymphoma grade II, intrathoracic lymph nodes: Secondary | ICD-10-CM

## 2016-11-07 NOTE — Progress Notes (Signed)
[facility] [phone]   [phone]  OFFICE PROGRESS NOTE  [name], MD [place] [address]  DIAGNOSIS: : Stage III follicular lymphoma, grade 2, diagnosed in June 2012   PRIOR THERAPY:  1) Systemic chemotherapy with CHOP/Rituxan, status post 8 cycles, last dose was given 07/19/2010.  2)  Maintenance therapy with Rituxan 375 mg/m2 given every 2 months for a total of 2 years, status post 12 cycles.  CURRENT THERAPY: Observation.  INTERVAL HISTORY: [name] 61 y.o. male returns to the clinic today for six-month follow-up visit. The patient is feeling fine today with no specific complaints. He denied having any chest pain, shortness of breath, cough or hemoptysis. He denied having any fever or chills. He has no nausea, vomiting, diarrhea or constipation. He has no significant weight loss or night sweats. He has no palpable lymphadenopathy.  MEDICAL HISTORY: Past Medical History:  Diagnosis Date  . Dyslipidemia   . Gout attack 01/16/2012  . Hypertension   . nhl dx'd 01/2010  . Venous insufficiency     ALLERGIES:  is allergic to penicillins.  MEDICATIONS:  Current Outpatient Prescriptions  Medication Sig Dispense Refill  . allopurinol (ZYLOPRIM) 100 MG tablet TAKE 1 TABLET BY MOUTH ONCE DAILY 90 tablet 0  . allopurinol (ZYLOPRIM) 300 MG tablet Take 300 mg by mouth daily.  2  . amLODipine (NORVASC) 5 MG tablet Take 5 mg by mouth daily.      [name] Kitchen atorvastatin (LIPITOR) 20 MG tablet   5  . BENICAR HCT 40-25 MG per tablet     . carvedilol (COREG) 12.5 MG tablet 12.5 mg daily.    . Cholecalciferol (VITAMIN D-3 PO) Take 1 tablet by mouth daily. 500mg     . clobetasol cream (TEMOVATE) 0.05 %   3  . CONTRAVE 8-90 MG TB12 Take 2 tablets by mouth 2 (two) times daily.  6  . Ibuprofen (ADVIL) 200 MG CAPS Take by mouth as needed.      . multivitamin-iron-minerals-folic acid (CENTRUM) chewable tablet Chew 1 tablet by mouth  daily.    . Omega-3 Fatty Acids (FISH OIL PO) Take 1 tablet by mouth daily.       No current facility-administered medications for this visit.    Facility-Administered Medications Ordered in Other Visits  Medication Dose Route Frequency Provider Last Rate Last Dose  . sodium chloride 0.9 % injection 10 mL  10 mL Intravenous PRN Curt Bears, MD   10 mL at 05/04/15 1529    REVIEW OF SYSTEMS:  A comprehensive review of systems was negative.   PHYSICAL EXAMINATION: General appearance: alert, cooperative and no distress Head: Normocephalic, without obvious abnormality, atraumatic Neck: no adenopathy Lymph nodes: Cervical, supraclavicular, and axillary nodes normal. Resp: clear to auscultation bilaterally Back: symmetric, no curvature. ROM normal. No CVA tenderness. Cardio: regular rate and rhythm, S1, S2 normal, no murmur, click, rub or gallop GI: soft, non-tender; bowel sounds normal; no masses,  no organomegaly Extremities: extremities normal, atraumatic, no cyanosis or edema  ECOG PERFORMANCE STATUS: 0 - Asymptomatic  Blood pressure (!) 139/99, pulse 90, temperature 97.7 F (36.5 C), temperature source Oral, resp. rate 18, height 5\' 10"  (1.778 m), weight 36 lb 6.4 oz (16.5 kg), SpO2 98 %.  LABORATORY DATA: Lab Results  Component Value Date   WBC 7.8 10/24/2016   HGB 14.5 10/24/2016   HCT 43.8 10/24/2016   MCV 92.4 10/24/2016   PLT 177 10/24/2016  Chemistry      Component Value Date/Time   NA 137 10/24/2016 1440   K 3.6 10/24/2016 1440   CL 105 05/14/2012 0840   CO2 26 10/24/2016 1440   BUN 16.2 10/24/2016 1440   CREATININE 0.8 10/24/2016 1440      Component Value Date/Time   CALCIUM 9.2 10/24/2016 1440   ALKPHOS 131 10/24/2016 1440   AST 31 10/24/2016 1440   ALT 31 10/24/2016 1440   BILITOT 0.61 10/24/2016 1440      RADIOLOGY STUDIES: No results found. ASSESSMENT AND PLAN:  This is a very pleasant 61 years old white male with a stage IIIa follicular  lymphoma status post treatment with CHOP/Rituxan followed by maintenance Rituxan for 2 years and has been observation for more than 3 years now. The patient continues to do well with no specific complaints. He was unable to get imaging studies because of the high insurance copayment this time. His lab work is unremarkable today. I recommended for the patient to continue on observation with repeat blood work in 6 months. He was advised to call immediately if he has any concerning symptoms in the interval. All questions were answered. The patient knows to call the clinic with any problems, questions or concerns. We can certainly see the patient much sooner if necessary.  Disclaimer: This note was dictated with voice recognition software. Similar sounding words can inadvertently be transcribed and may not be corrected upon review.

## 2016-11-07 NOTE — Telephone Encounter (Signed)
Spoke with patient re next appointment for 11/26 and patient will get updated schedule at next visit. Added additional appointments for January thru April. Central radiology will call re scan.

## 2016-12-05 ENCOUNTER — Ambulatory Visit (HOSPITAL_BASED_OUTPATIENT_CLINIC_OR_DEPARTMENT_OTHER): Payer: BLUE CROSS/BLUE SHIELD

## 2016-12-05 DIAGNOSIS — Z95828 Presence of other vascular implants and grafts: Secondary | ICD-10-CM

## 2016-12-05 DIAGNOSIS — C8210 Follicular lymphoma grade II, unspecified site: Secondary | ICD-10-CM

## 2016-12-05 DIAGNOSIS — Z452 Encounter for adjustment and management of vascular access device: Secondary | ICD-10-CM

## 2016-12-05 MED ORDER — HEPARIN SOD (PORK) LOCK FLUSH 100 UNIT/ML IV SOLN
500.0000 [IU] | Freq: Once | INTRAVENOUS | Status: AC | PRN
  Administered 2016-12-05: 500 [IU] via INTRAVENOUS
  Filled 2016-12-05: qty 5

## 2016-12-05 MED ORDER — SODIUM CHLORIDE 0.9 % IJ SOLN
10.0000 mL | INTRAMUSCULAR | Status: DC | PRN
  Administered 2016-12-05: 10 mL via INTRAVENOUS
  Filled 2016-12-05: qty 10

## 2016-12-22 ENCOUNTER — Encounter: Payer: Self-pay | Admitting: Internal Medicine

## 2017-01-10 DIAGNOSIS — I499 Cardiac arrhythmia, unspecified: Secondary | ICD-10-CM

## 2017-01-10 HISTORY — DX: Cardiac arrhythmia, unspecified: I49.9

## 2017-01-16 ENCOUNTER — Inpatient Hospital Stay: Payer: BLUE CROSS/BLUE SHIELD | Attending: Internal Medicine

## 2017-01-16 DIAGNOSIS — Z452 Encounter for adjustment and management of vascular access device: Secondary | ICD-10-CM | POA: Insufficient documentation

## 2017-01-16 DIAGNOSIS — Z95828 Presence of other vascular implants and grafts: Secondary | ICD-10-CM

## 2017-01-16 DIAGNOSIS — C8210 Follicular lymphoma grade II, unspecified site: Secondary | ICD-10-CM | POA: Diagnosis not present

## 2017-01-16 MED ORDER — HEPARIN SOD (PORK) LOCK FLUSH 100 UNIT/ML IV SOLN
500.0000 [IU] | Freq: Once | INTRAVENOUS | Status: DC | PRN
  Filled 2017-01-16: qty 5

## 2017-01-16 MED ORDER — SODIUM CHLORIDE 0.9 % IJ SOLN
10.0000 mL | INTRAMUSCULAR | Status: DC | PRN
  Filled 2017-01-16: qty 10

## 2017-02-10 DIAGNOSIS — A419 Sepsis, unspecified organism: Secondary | ICD-10-CM

## 2017-02-10 HISTORY — DX: Sepsis, unspecified organism: A41.9

## 2017-02-13 DIAGNOSIS — R0602 Shortness of breath: Secondary | ICD-10-CM | POA: Diagnosis not present

## 2017-02-15 DIAGNOSIS — K5900 Constipation, unspecified: Secondary | ICD-10-CM | POA: Diagnosis not present

## 2017-02-15 DIAGNOSIS — R0602 Shortness of breath: Secondary | ICD-10-CM | POA: Diagnosis not present

## 2017-02-16 ENCOUNTER — Emergency Department (HOSPITAL_COMMUNITY): Payer: BLUE CROSS/BLUE SHIELD

## 2017-02-16 ENCOUNTER — Inpatient Hospital Stay (HOSPITAL_COMMUNITY): Payer: BLUE CROSS/BLUE SHIELD

## 2017-02-16 ENCOUNTER — Inpatient Hospital Stay (HOSPITAL_COMMUNITY)
Admission: EM | Admit: 2017-02-16 | Discharge: 2017-03-22 | DRG: 004 | Disposition: A | Discharge status: Rehabilitation Facility | Payer: BLUE CROSS/BLUE SHIELD | Attending: Internal Medicine | Admitting: Internal Medicine

## 2017-02-16 ENCOUNTER — Encounter (HOSPITAL_COMMUNITY): Payer: Self-pay | Admitting: Emergency Medicine

## 2017-02-16 ENCOUNTER — Other Ambulatory Visit: Payer: Self-pay

## 2017-02-16 DIAGNOSIS — C8290 Follicular lymphoma, unspecified, unspecified site: Secondary | ICD-10-CM | POA: Diagnosis not present

## 2017-02-16 DIAGNOSIS — Z0189 Encounter for other specified special examinations: Secondary | ICD-10-CM

## 2017-02-16 DIAGNOSIS — F321 Major depressive disorder, single episode, moderate: Secondary | ICD-10-CM

## 2017-02-16 DIAGNOSIS — R300 Dysuria: Secondary | ICD-10-CM

## 2017-02-16 DIAGNOSIS — J09X2 Influenza due to identified novel influenza A virus with other respiratory manifestations: Secondary | ICD-10-CM | POA: Diagnosis not present

## 2017-02-16 DIAGNOSIS — E871 Hypo-osmolality and hyponatremia: Secondary | ICD-10-CM | POA: Diagnosis present

## 2017-02-16 DIAGNOSIS — E876 Hypokalemia: Secondary | ICD-10-CM | POA: Diagnosis not present

## 2017-02-16 DIAGNOSIS — M7989 Other specified soft tissue disorders: Secondary | ICD-10-CM | POA: Diagnosis not present

## 2017-02-16 DIAGNOSIS — G4700 Insomnia, unspecified: Secondary | ICD-10-CM | POA: Diagnosis not present

## 2017-02-16 DIAGNOSIS — Z9911 Dependence on respirator [ventilator] status: Secondary | ICD-10-CM | POA: Diagnosis not present

## 2017-02-16 DIAGNOSIS — R918 Other nonspecific abnormal finding of lung field: Secondary | ICD-10-CM | POA: Diagnosis not present

## 2017-02-16 DIAGNOSIS — L89152 Pressure ulcer of sacral region, stage 2: Secondary | ICD-10-CM | POA: Diagnosis not present

## 2017-02-16 DIAGNOSIS — A499 Bacterial infection, unspecified: Secondary | ICD-10-CM | POA: Diagnosis not present

## 2017-02-16 DIAGNOSIS — M109 Gout, unspecified: Secondary | ICD-10-CM | POA: Diagnosis present

## 2017-02-16 DIAGNOSIS — J09X1 Influenza due to identified novel influenza A virus with pneumonia: Secondary | ICD-10-CM | POA: Diagnosis not present

## 2017-02-16 DIAGNOSIS — N390 Urinary tract infection, site not specified: Secondary | ICD-10-CM | POA: Diagnosis not present

## 2017-02-16 DIAGNOSIS — T148XXA Other injury of unspecified body region, initial encounter: Secondary | ICD-10-CM

## 2017-02-16 DIAGNOSIS — I110 Hypertensive heart disease with heart failure: Secondary | ICD-10-CM | POA: Diagnosis not present

## 2017-02-16 DIAGNOSIS — L97513 Non-pressure chronic ulcer of other part of right foot with necrosis of muscle: Secondary | ICD-10-CM

## 2017-02-16 DIAGNOSIS — L0390 Cellulitis, unspecified: Secondary | ICD-10-CM | POA: Diagnosis not present

## 2017-02-16 DIAGNOSIS — R140 Abdominal distension (gaseous): Secondary | ICD-10-CM

## 2017-02-16 DIAGNOSIS — R5381 Other malaise: Secondary | ICD-10-CM | POA: Diagnosis not present

## 2017-02-16 DIAGNOSIS — I5031 Acute diastolic (congestive) heart failure: Secondary | ICD-10-CM | POA: Diagnosis not present

## 2017-02-16 DIAGNOSIS — Z23 Encounter for immunization: Secondary | ICD-10-CM | POA: Diagnosis not present

## 2017-02-16 DIAGNOSIS — E119 Type 2 diabetes mellitus without complications: Secondary | ICD-10-CM

## 2017-02-16 DIAGNOSIS — J9811 Atelectasis: Secondary | ICD-10-CM | POA: Diagnosis not present

## 2017-02-16 DIAGNOSIS — E1165 Type 2 diabetes mellitus with hyperglycemia: Secondary | ICD-10-CM | POA: Diagnosis not present

## 2017-02-16 DIAGNOSIS — C8200 Follicular lymphoma grade I, unspecified site: Secondary | ICD-10-CM | POA: Diagnosis not present

## 2017-02-16 DIAGNOSIS — R0682 Tachypnea, not elsewhere classified: Secondary | ICD-10-CM | POA: Diagnosis not present

## 2017-02-16 DIAGNOSIS — R0602 Shortness of breath: Secondary | ICD-10-CM | POA: Diagnosis not present

## 2017-02-16 DIAGNOSIS — R0902 Hypoxemia: Secondary | ICD-10-CM | POA: Diagnosis not present

## 2017-02-16 DIAGNOSIS — M797 Fibromyalgia: Secondary | ICD-10-CM | POA: Diagnosis not present

## 2017-02-16 DIAGNOSIS — T884XXA Failed or difficult intubation, initial encounter: Secondary | ICD-10-CM

## 2017-02-16 DIAGNOSIS — J9600 Acute respiratory failure, unspecified whether with hypoxia or hypercapnia: Secondary | ICD-10-CM

## 2017-02-16 DIAGNOSIS — G4733 Obstructive sleep apnea (adult) (pediatric): Secondary | ICD-10-CM

## 2017-02-16 DIAGNOSIS — E874 Mixed disorder of acid-base balance: Secondary | ICD-10-CM | POA: Diagnosis not present

## 2017-02-16 DIAGNOSIS — I420 Dilated cardiomyopathy: Secondary | ICD-10-CM | POA: Diagnosis not present

## 2017-02-16 DIAGNOSIS — D72829 Elevated white blood cell count, unspecified: Secondary | ICD-10-CM | POA: Diagnosis not present

## 2017-02-16 DIAGNOSIS — J9690 Respiratory failure, unspecified, unspecified whether with hypoxia or hypercapnia: Secondary | ICD-10-CM | POA: Diagnosis not present

## 2017-02-16 DIAGNOSIS — Z7982 Long term (current) use of aspirin: Secondary | ICD-10-CM

## 2017-02-16 DIAGNOSIS — L03312 Cellulitis of back [any part except buttock]: Secondary | ICD-10-CM | POA: Diagnosis not present

## 2017-02-16 DIAGNOSIS — J80 Acute respiratory distress syndrome: Secondary | ICD-10-CM | POA: Diagnosis not present

## 2017-02-16 DIAGNOSIS — F329 Major depressive disorder, single episode, unspecified: Secondary | ICD-10-CM | POA: Diagnosis present

## 2017-02-16 DIAGNOSIS — J9601 Acute respiratory failure with hypoxia: Secondary | ICD-10-CM | POA: Diagnosis not present

## 2017-02-16 DIAGNOSIS — T380X5A Adverse effect of glucocorticoids and synthetic analogues, initial encounter: Secondary | ICD-10-CM | POA: Diagnosis not present

## 2017-02-16 DIAGNOSIS — R579 Shock, unspecified: Secondary | ICD-10-CM

## 2017-02-16 DIAGNOSIS — G7281 Critical illness myopathy: Secondary | ICD-10-CM

## 2017-02-16 DIAGNOSIS — I5021 Acute systolic (congestive) heart failure: Secondary | ICD-10-CM

## 2017-02-16 DIAGNOSIS — E1151 Type 2 diabetes mellitus with diabetic peripheral angiopathy without gangrene: Secondary | ICD-10-CM | POA: Diagnosis present

## 2017-02-16 DIAGNOSIS — M19072 Primary osteoarthritis, left ankle and foot: Secondary | ICD-10-CM | POA: Diagnosis not present

## 2017-02-16 DIAGNOSIS — R069 Unspecified abnormalities of breathing: Secondary | ICD-10-CM | POA: Diagnosis not present

## 2017-02-16 DIAGNOSIS — Z4682 Encounter for fitting and adjustment of non-vascular catheter: Secondary | ICD-10-CM | POA: Diagnosis not present

## 2017-02-16 DIAGNOSIS — I10 Essential (primary) hypertension: Secondary | ICD-10-CM | POA: Diagnosis not present

## 2017-02-16 DIAGNOSIS — Z6841 Body Mass Index (BMI) 40.0 and over, adult: Secondary | ICD-10-CM | POA: Diagnosis not present

## 2017-02-16 DIAGNOSIS — I83025 Varicose veins of left lower extremity with ulcer other part of foot: Secondary | ICD-10-CM | POA: Diagnosis present

## 2017-02-16 DIAGNOSIS — F419 Anxiety disorder, unspecified: Secondary | ICD-10-CM | POA: Diagnosis not present

## 2017-02-16 DIAGNOSIS — E785 Hyperlipidemia, unspecified: Secondary | ICD-10-CM | POA: Diagnosis not present

## 2017-02-16 DIAGNOSIS — T502X5A Adverse effect of carbonic-anhydrase inhibitors, benzothiadiazides and other diuretics, initial encounter: Secondary | ICD-10-CM | POA: Diagnosis not present

## 2017-02-16 DIAGNOSIS — R05 Cough: Secondary | ICD-10-CM | POA: Diagnosis not present

## 2017-02-16 DIAGNOSIS — R1313 Dysphagia, pharyngeal phase: Secondary | ICD-10-CM | POA: Diagnosis present

## 2017-02-16 DIAGNOSIS — M7061 Trochanteric bursitis, right hip: Secondary | ICD-10-CM | POA: Diagnosis not present

## 2017-02-16 DIAGNOSIS — D696 Thrombocytopenia, unspecified: Secondary | ICD-10-CM | POA: Diagnosis present

## 2017-02-16 DIAGNOSIS — J181 Lobar pneumonia, unspecified organism: Secondary | ICD-10-CM | POA: Diagnosis not present

## 2017-02-16 DIAGNOSIS — Z452 Encounter for adjustment and management of vascular access device: Secondary | ICD-10-CM | POA: Diagnosis not present

## 2017-02-16 DIAGNOSIS — K9423 Gastrostomy malfunction: Secondary | ICD-10-CM | POA: Diagnosis not present

## 2017-02-16 DIAGNOSIS — L97811 Non-pressure chronic ulcer of other part of right lower leg limited to breakdown of skin: Secondary | ICD-10-CM | POA: Diagnosis not present

## 2017-02-16 DIAGNOSIS — L98429 Non-pressure chronic ulcer of back with unspecified severity: Secondary | ICD-10-CM | POA: Diagnosis not present

## 2017-02-16 DIAGNOSIS — I340 Nonrheumatic mitral (valve) insufficiency: Secondary | ICD-10-CM | POA: Diagnosis present

## 2017-02-16 DIAGNOSIS — J810 Acute pulmonary edema: Secondary | ICD-10-CM | POA: Diagnosis not present

## 2017-02-16 DIAGNOSIS — L97523 Non-pressure chronic ulcer of other part of left foot with necrosis of muscle: Secondary | ICD-10-CM

## 2017-02-16 DIAGNOSIS — R846 Abnormal cytological findings in specimens from respiratory organs and thorax: Secondary | ICD-10-CM | POA: Diagnosis not present

## 2017-02-16 DIAGNOSIS — Z9289 Personal history of other medical treatment: Secondary | ICD-10-CM

## 2017-02-16 DIAGNOSIS — J849 Interstitial pulmonary disease, unspecified: Secondary | ICD-10-CM

## 2017-02-16 DIAGNOSIS — J9382 Other air leak: Secondary | ICD-10-CM | POA: Diagnosis not present

## 2017-02-16 DIAGNOSIS — L089 Local infection of the skin and subcutaneous tissue, unspecified: Secondary | ICD-10-CM

## 2017-02-16 DIAGNOSIS — Z8572 Personal history of non-Hodgkin lymphomas: Secondary | ICD-10-CM

## 2017-02-16 DIAGNOSIS — I481 Persistent atrial fibrillation: Secondary | ICD-10-CM | POA: Diagnosis present

## 2017-02-16 DIAGNOSIS — C8590 Non-Hodgkin lymphoma, unspecified, unspecified site: Secondary | ICD-10-CM

## 2017-02-16 DIAGNOSIS — M419 Scoliosis, unspecified: Secondary | ICD-10-CM | POA: Diagnosis present

## 2017-02-16 DIAGNOSIS — R1310 Dysphagia, unspecified: Secondary | ICD-10-CM

## 2017-02-16 DIAGNOSIS — Z9889 Other specified postprocedural states: Secondary | ICD-10-CM

## 2017-02-16 DIAGNOSIS — T85598D Other mechanical complication of other gastrointestinal prosthetic devices, implants and grafts, subsequent encounter: Secondary | ICD-10-CM

## 2017-02-16 DIAGNOSIS — J1008 Influenza due to other identified influenza virus with other specified pneumonia: Secondary | ICD-10-CM | POA: Diagnosis not present

## 2017-02-16 DIAGNOSIS — Z9221 Personal history of antineoplastic chemotherapy: Secondary | ICD-10-CM

## 2017-02-16 DIAGNOSIS — J189 Pneumonia, unspecified organism: Secondary | ICD-10-CM

## 2017-02-16 DIAGNOSIS — E118 Type 2 diabetes mellitus with unspecified complications: Secondary | ICD-10-CM | POA: Diagnosis not present

## 2017-02-16 DIAGNOSIS — L97821 Non-pressure chronic ulcer of other part of left lower leg limited to breakdown of skin: Secondary | ICD-10-CM | POA: Diagnosis not present

## 2017-02-16 DIAGNOSIS — J101 Influenza due to other identified influenza virus with other respiratory manifestations: Secondary | ICD-10-CM | POA: Diagnosis not present

## 2017-02-16 DIAGNOSIS — J1100 Influenza due to unidentified influenza virus with unspecified type of pneumonia: Secondary | ICD-10-CM | POA: Diagnosis not present

## 2017-02-16 DIAGNOSIS — E870 Hyperosmolality and hypernatremia: Secondary | ICD-10-CM | POA: Diagnosis not present

## 2017-02-16 DIAGNOSIS — I4891 Unspecified atrial fibrillation: Secondary | ICD-10-CM | POA: Diagnosis not present

## 2017-02-16 DIAGNOSIS — Z87891 Personal history of nicotine dependence: Secondary | ICD-10-CM

## 2017-02-16 DIAGNOSIS — J811 Chronic pulmonary edema: Secondary | ICD-10-CM

## 2017-02-16 DIAGNOSIS — R571 Hypovolemic shock: Secondary | ICD-10-CM | POA: Diagnosis not present

## 2017-02-16 DIAGNOSIS — Z930 Tracheostomy status: Secondary | ICD-10-CM | POA: Diagnosis not present

## 2017-02-16 DIAGNOSIS — R4702 Dysphasia: Secondary | ICD-10-CM | POA: Diagnosis not present

## 2017-02-16 DIAGNOSIS — Z880 Allergy status to penicillin: Secondary | ICD-10-CM

## 2017-02-16 DIAGNOSIS — L8990 Pressure ulcer of unspecified site, unspecified stage: Secondary | ICD-10-CM

## 2017-02-16 DIAGNOSIS — E66813 Obesity, class 3: Secondary | ICD-10-CM

## 2017-02-16 DIAGNOSIS — Z79899 Other long term (current) drug therapy: Secondary | ICD-10-CM

## 2017-02-16 DIAGNOSIS — M19071 Primary osteoarthritis, right ankle and foot: Secondary | ICD-10-CM | POA: Diagnosis not present

## 2017-02-16 DIAGNOSIS — I83024 Varicose veins of left lower extremity with ulcer of heel and midfoot: Secondary | ICD-10-CM | POA: Diagnosis present

## 2017-02-16 DIAGNOSIS — J984 Other disorders of lung: Secondary | ICD-10-CM | POA: Diagnosis not present

## 2017-02-16 DIAGNOSIS — D72819 Decreased white blood cell count, unspecified: Secondary | ICD-10-CM | POA: Diagnosis present

## 2017-02-16 DIAGNOSIS — I83013 Varicose veins of right lower extremity with ulcer of ankle: Secondary | ICD-10-CM | POA: Diagnosis present

## 2017-02-16 HISTORY — DX: Non-pressure chronic ulcer of unspecified part of unspecified lower leg with unspecified severity: L97.909

## 2017-02-16 HISTORY — DX: Varicose veins of unspecified lower extremity with ulcer of unspecified site: I83.009

## 2017-02-16 HISTORY — DX: Type 2 diabetes mellitus without complications: E11.9

## 2017-02-16 LAB — CBC WITH DIFFERENTIAL/PLATELET
Basophils Absolute: 0 10*3/uL (ref 0.0–0.1)
Basophils Relative: 0 %
EOS ABS: 0 10*3/uL (ref 0.0–0.7)
EOS PCT: 0 %
HCT: 44.8 % (ref 39.0–52.0)
HEMOGLOBIN: 14.4 g/dL (ref 13.0–17.0)
Lymphocytes Relative: 11 %
Lymphs Abs: 0.3 10*3/uL — ABNORMAL LOW (ref 0.7–4.0)
MCH: 29.9 pg (ref 26.0–34.0)
MCHC: 32.1 g/dL (ref 30.0–36.0)
MCV: 92.9 fL (ref 78.0–100.0)
MONO ABS: 0.3 10*3/uL (ref 0.1–1.0)
MONOS PCT: 10 %
NEUTROS PCT: 79 %
Neutro Abs: 2.3 10*3/uL (ref 1.7–7.7)
Platelets: 91 10*3/uL — ABNORMAL LOW (ref 150–400)
RBC: 4.82 MIL/uL (ref 4.22–5.81)
RDW: 16.4 % — AB (ref 11.5–15.5)
WBC: 2.9 10*3/uL — ABNORMAL LOW (ref 4.0–10.5)

## 2017-02-16 LAB — COMPREHENSIVE METABOLIC PANEL
ALBUMIN: 3 g/dL — AB (ref 3.5–5.0)
ALK PHOS: 185 U/L — AB (ref 38–126)
ALT: 66 U/L — AB (ref 17–63)
AST: 115 U/L — AB (ref 15–41)
Anion gap: 16 — ABNORMAL HIGH (ref 5–15)
BILIRUBIN TOTAL: 1 mg/dL (ref 0.3–1.2)
BUN: 35 mg/dL — AB (ref 6–20)
CALCIUM: 8.1 mg/dL — AB (ref 8.9–10.3)
CO2: 21 mmol/L — ABNORMAL LOW (ref 22–32)
CREATININE: 1.13 mg/dL (ref 0.61–1.24)
Chloride: 95 mmol/L — ABNORMAL LOW (ref 101–111)
GFR calc Af Amer: >60 mL/min (ref >60)
Glucose, Bld: 86 mg/dL (ref 65–99)
POTASSIUM: 3.8 mmol/L (ref 3.5–5.1)
Sodium: 132 mmol/L — ABNORMAL LOW (ref 135–145)
TOTAL PROTEIN: 5.9 g/dL — AB (ref 6.5–8.1)

## 2017-02-16 LAB — TROPONIN I
Troponin I: 0.08 ng/mL (ref <0.03)
Troponin I: 0.05 ng/mL (ref <0.03)

## 2017-02-16 LAB — SODIUM, URINE, RANDOM: SODIUM UR: 14 mmol/L

## 2017-02-16 LAB — I-STAT ARTERIAL BLOOD GAS, ED
ACID-BASE DEFICIT: 2 mmol/L (ref 0.0–2.0)
BICARBONATE: 21.2 mmol/L (ref 20.0–28.0)
O2 Saturation: 92 %
PO2 ART: 59 mmHg — AB (ref 83.0–108.0)
TCO2: 22 mmol/L (ref 22–32)
pCO2 arterial: 30.6 mmHg — ABNORMAL LOW (ref 32.0–48.0)
pH, Arterial: 7.448 (ref 7.350–7.450)

## 2017-02-16 LAB — MAGNESIUM: Magnesium: 1.6 mg/dL — ABNORMAL LOW (ref 1.7–2.4)

## 2017-02-16 LAB — I-STAT CG4 LACTIC ACID, ED
Lactic Acid, Venous: 1.96 mmol/L — ABNORMAL HIGH (ref 0.5–1.9)
Lactic Acid, Venous: 2 mmol/L (ref 0.5–1.9)

## 2017-02-16 LAB — LACTIC ACID, PLASMA: Lactic Acid, Venous: 1.8 mmol/L (ref 0.5–1.9)

## 2017-02-16 LAB — HEMOGLOBIN A1C
Hgb A1c MFr Bld: 6.3 % — ABNORMAL HIGH (ref 4.8–5.6)
Mean Plasma Glucose: 134.11 mg/dL

## 2017-02-16 LAB — PROTIME-INR
INR: 1.14
PROTHROMBIN TIME: 14.6 s (ref 11.4–15.2)

## 2017-02-16 LAB — PROCALCITONIN: PROCALCITONIN: 0.33 ng/mL

## 2017-02-16 LAB — BRAIN NATRIURETIC PEPTIDE: B Natriuretic Peptide: 251.1 pg/mL — ABNORMAL HIGH (ref 0.0–100.0)

## 2017-02-16 LAB — CORTISOL: Cortisol, Plasma: 11.2 ug/dL

## 2017-02-16 MED ORDER — DEXTROSE 5 % IV SOLN
1.0000 g | Freq: Three times a day (TID) | INTRAVENOUS | Status: DC
  Administered 2017-02-17 – 2017-02-20 (×10): 1 g via INTRAVENOUS
  Filled 2017-02-16 (×11): qty 1

## 2017-02-16 MED ORDER — ALBUTEROL SULFATE (2.5 MG/3ML) 0.083% IN NEBU
5.0000 mg | INHALATION_SOLUTION | Freq: Once | RESPIRATORY_TRACT | Status: AC
  Administered 2017-02-16: 5 mg via RESPIRATORY_TRACT
  Filled 2017-02-16: qty 6

## 2017-02-16 MED ORDER — ONDANSETRON HCL 4 MG/2ML IJ SOLN
4.0000 mg | Freq: Once | INTRAMUSCULAR | Status: AC
  Administered 2017-02-16: 4 mg via INTRAVENOUS
  Filled 2017-02-16: qty 2

## 2017-02-16 MED ORDER — SODIUM CHLORIDE 0.9 % IV BOLUS (SEPSIS): 500.0000 mL | Freq: Once | INTRAVENOUS | Status: DC

## 2017-02-16 MED ORDER — SODIUM CHLORIDE 0.9% FLUSH
10.0000 mL | Freq: Two times a day (BID) | INTRAVENOUS | Status: DC
  Administered 2017-02-17 – 2017-02-23 (×11): 10 mL

## 2017-02-16 MED ORDER — SODIUM CHLORIDE 0.9% FLUSH: 10.0000 mL | INTRAVENOUS | Status: DC | PRN

## 2017-02-16 MED ORDER — VANCOMYCIN HCL 10 G IV SOLR
1250.0000 mg | Freq: Two times a day (BID) | INTRAVENOUS | Status: DC
  Administered 2017-02-17 – 2017-02-18 (×4): 1250 mg via INTRAVENOUS
  Filled 2017-02-16 (×5): qty 1250

## 2017-02-16 MED ORDER — LEVOFLOXACIN IN D5W 750 MG/150ML IV SOLN
750.0000 mg | Freq: Once | INTRAVENOUS | Status: AC
  Administered 2017-02-16: 750 mg via INTRAVENOUS
  Filled 2017-02-16: qty 150

## 2017-02-16 MED ORDER — MAGNESIUM SULFATE 2 GM/50ML IV SOLN
2.0000 g | Freq: Once | INTRAVENOUS | Status: AC
  Administered 2017-02-16: 2 g via INTRAVENOUS
  Filled 2017-02-16: qty 50

## 2017-02-16 MED ORDER — AZTREONAM 2 G IJ SOLR
2.0000 g | Freq: Once | INTRAMUSCULAR | Status: AC
  Administered 2017-02-16: 2 g via INTRAVENOUS
  Filled 2017-02-16: qty 2

## 2017-02-16 MED ORDER — SODIUM CHLORIDE 0.9 % IV BOLUS (SEPSIS)
1000.0000 mL | Freq: Once | INTRAVENOUS | Status: AC
  Administered 2017-02-16: 1000 mL via INTRAVENOUS

## 2017-02-16 MED ORDER — IPRATROPIUM-ALBUTEROL 0.5-2.5 (3) MG/3ML IN SOLN
3.0000 mL | RESPIRATORY_TRACT | Status: DC | PRN
  Filled 2017-02-16: qty 3

## 2017-02-16 MED ORDER — HEPARIN (PORCINE) IN NACL 100-0.45 UNIT/ML-% IJ SOLN
1600.0000 [IU]/h | INTRAMUSCULAR | Status: DC
  Administered 2017-02-17: 1800 [IU]/h via INTRAVENOUS
  Administered 2017-02-17: 1900 [IU]/h via INTRAVENOUS
  Filled 2017-02-16 (×2): qty 250

## 2017-02-16 MED ORDER — SODIUM CHLORIDE 0.9 % IV BOLUS (SEPSIS): 1000.0000 mL | Freq: Once | INTRAVENOUS | Status: DC

## 2017-02-16 MED ORDER — PANTOPRAZOLE SODIUM 40 MG IV SOLR
40.0000 mg | Freq: Every day | INTRAVENOUS | Status: DC
  Administered 2017-02-16 – 2017-02-20 (×5): 40 mg via INTRAVENOUS
  Filled 2017-02-16 (×5): qty 40

## 2017-02-16 MED ORDER — HEPARIN SODIUM (PORCINE) 5000 UNIT/ML IJ SOLN: 5000.0000 [IU] | Freq: Three times a day (TID) | INTRAMUSCULAR | Status: DC

## 2017-02-16 MED ORDER — VANCOMYCIN HCL 10 G IV SOLR
2500.0000 mg | Freq: Once | INTRAVENOUS | Status: AC
  Administered 2017-02-16: 2500 mg via INTRAVENOUS
  Filled 2017-02-16: qty 2500

## 2017-02-16 MED ORDER — HEPARIN BOLUS VIA INFUSION
5000.0000 [IU] | Freq: Once | INTRAVENOUS | Status: AC
  Administered 2017-02-17: 5000 [IU] via INTRAVENOUS
  Filled 2017-02-16: qty 5000

## 2017-02-16 NOTE — ED Notes (Signed)
Gave pt less than 1/2 cup of water, per Dr. Regenia Skeeter.

## 2017-02-16 NOTE — ED Triage Notes (Signed)
Patient arrived to ED via [facility] from home. CPAP on patient. EMS reports:  Patient from home. Recently diagnosed with pneumonia bilateral lungs at Urgent Care. Prescribed oxygen, but has not started at home. 20 gauge in L AC. EMS administered Solumedrol 125 mg and Duoneb. Patient placed on CPAP by EMS.  RBBB on monitor. New onset A fib. BP 98/46, 85% on room air. 100% on CPAP. CBG 101.

## 2017-02-16 NOTE — ED Notes (Signed)
Patient transported to x-ray. ?

## 2017-02-16 NOTE — Progress Notes (Addendum)
ANTICOAGULATION CONSULT NOTE - Initial Consult  Pharmacy Consult for heparin Indication: atrial fibrillation  Allergies  Allergen Reactions  . Penicillins Swelling    Age 62 or 40 - pt states he had swelling all over and his lips got real big    Patient Measurements: Height: 5\' 11"  (180.3 cm) Weight: (!) 350 lb (158.8 kg) IBW/kg (Calculated) : 75.3 Heparin Dosing Weight: 115kg  Vital Signs: Temp: 98.2 F (36.8 C) (02/07 1556) Temp Source: Oral (02/07 1556) BP: 111/82 (02/07 2230) Pulse Rate: 90 (02/07 2230)  Labs: Recent Labs    02/16/17 1710 02/16/17 2112  HGB 14.4  --   HCT 44.8  --   PLT 91*  --   LABPROT 14.6  --   INR 1.14  --   CREATININE 1.13  --   TROPONINI 0.08* 0.05*    Estimated Creatinine Clearance: 105.5 mL/min (by C-G formula based on SCr of 1.13 mg/dL).   Medical History: Past Medical History:  Diagnosis Date  . Dyslipidemia   . Gout attack 01/16/2012  . Hypertension   . nhl dx'd 01/2010  . Venous insufficiency     Assessment: 62yo male recently dx'd w/ PNA at [facility], brought to ED by EMS for SOB, found to be in new Afib, to begin heparin.  Goal of Therapy:  Heparin level 0.3-0.7 units/ml Monitor platelets by anticoagulation protocol: Yes   Plan:  Will give heparin 5000 units IV bolus x1 followed by gtt at 1900 units/hr and monitor heparin levels and CBC.  Wynona Neat, PharmD, BCPS  02/16/2017,11:08 PM   ADDENDUM: Initial heparin level above goal (0.91) though bolus was given late.  Will decrease heparin gtt slightly to 1800 units/hr and check level in 6hr.    VB 02/17/2017 6:54 AM

## 2017-02-16 NOTE — ED Provider Notes (Signed)
[facility] EMERGENCY DEPARTMENT Provider Note   CSN: [id number] Arrival date & time: 02/16/17  1527     History   Chief Complaint Chief Complaint  Patient presents with  . Respiratory Distress    HPI [name] is a 62 y.o. male.  HPI  62 year old male presents with shortness of breath.  Initially he is on BiPAP and this was taken off and he was placed on nasal cannula oxygen.  He tells me that he has been short of breath for a few weeks but it is much worse over the last 5 days or so.  He has been having a cough with blood, both streaks and sometimes clots.  Over the last several weeks he is also noticed increased leg swelling from baseline as well as abdominal distention and discomfort.  He denies any chest pain.  3 days ago he went to his PCPs and was told he was in atrial fibrillation which is new for him.  He felt some palpitations that day but none since.  He was also told he had double pneumonia and was placed on Levaquin.  He was told he would need to start Lasix after the antibiotics were done.  He received a breathing treatment that day and then another one yesterday.  However now it is even worse as far as shortness of breath and so he called EMS.  He was hypoxic to 85% so he was placed on CPAP, given a DuoNeb, and given 125 mg Solu-Medrol.  Past Medical History:  Diagnosis Date  . Dyslipidemia   . Gout attack 01/16/2012  . Hypertension   . nhl dx'd 01/2010  . Venous insufficiency     Patient Active Problem List   Diagnosis Date Noted  . Respiratory failure, unspecified with hypoxia ([place]) 02/16/2017  . Follicular lymphoma grade II of intrathoracic lymph nodes ([place]) 05/02/2016  . Portacath in place 05/04/2015  . Gout attack 01/16/2012  . Follicular lymphoma grade II ([place]) 12/16/2010    Past Surgical History:  Procedure Laterality Date  . BRONCHOSCOPY    . ESOPHAGOGASTRODUODENOSCOPY ENDOSCOPY     Dr Jearld Fenton  . INGUINAL HERNIA REPAIR     2001  .  PORT-A-CATH REMOVAL         Home Medications    Prior to Admission medications   Medication Sig Start Date End Date Taking? Authorizing Provider  allopurinol (ZYLOPRIM) 300 MG tablet Take 300 mg by mouth daily. 04/03/16  Yes [provider]  amLODipine (NORVASC) 5 MG tablet Take 5 mg by mouth daily.     Yes [provider]  atorvastatin (LIPITOR) 20 MG tablet Take 20 mg by mouth daily.  10/11/14  Yes [provider]  BENICAR HCT 40-25 MG per tablet Take 1 tablet by mouth daily.  02/05/11  Yes [provider]  carvedilol (COREG) 12.5 MG tablet Take 12.5 mg by mouth 2 (two) times daily with a meal.  01/26/13  Yes [name], MD  Cholecalciferol (VITAMIN D-3 PO) Take 1 tablet by mouth daily. 500mg    Yes [provider]  CONTRAVE 8-90 MG TB12 Take 2 tablets by mouth 2 (two) times daily. 10/06/15  Yes [provider]  Ibuprofen (ADVIL) 200 MG CAPS Take 400 mg by mouth as needed (pain).    Yes [provider]  levofloxacin (LEVAQUIN) 750 MG tablet Take 750 mg by mouth daily. x7 days. 02/13/17  Yes [provider]  multivitamin-iron-minerals-folic acid (CENTRUM) chewable tablet Chew 1 tablet  by mouth daily.   Yes [provider]  Omega-3 Fatty Acids (FISH OIL PO) Take 1 tablet by mouth daily.     Yes [provider]  ULORIC 40 MG tablet Take 40 mg by mouth daily. 10/28/16  Yes [provider]    Family History Family History  Problem Relation Age of Onset  . Colon cancer Neg Hx   . Pancreatic cancer Neg Hx   . Stomach cancer Neg Hx   . Rectal cancer Neg Hx     Social History Social History   Tobacco Use  . Smoking status: Former Smoker    Packs/day: 0.50    Years: 20.00    Pack years: 10.00    Last attempt to quit: 01/11/2012    Years since quitting: 5.1  . Smokeless tobacco: Never Used  Substance Use Topics  . Alcohol use: No  . Drug use: No     Allergies   Penicillins   Review  of Systems Review of Systems  Constitutional: Negative for fever.  Respiratory: Positive for cough and shortness of breath.   Cardiovascular: Positive for leg swelling. Negative for chest pain.  Gastrointestinal: Positive for abdominal distention and nausea. Negative for vomiting.  All other systems reviewed and are negative.    Physical Exam Updated Vital Signs BP 116/80   Pulse 86   Temp 98.2 F (36.8 C) (Oral)   Resp (!) 32   Ht 5\' 11"  (1.803 m)   Wt (!) 158.8 kg (350 lb)   SpO2 95%   BMI 48.82 kg/m   Physical Exam  Constitutional: He is oriented to person, place, and time. He appears well-developed and well-nourished.  Morbidly obese  HENT:  Head: Normocephalic and atraumatic.  Right Ear: External ear normal.  Left Ear: External ear normal.  Nose: Nose normal.  Mouth/Throat: No oropharyngeal exudate.  Eyes: Right eye exhibits no discharge. Left eye exhibits no discharge.  Neck: Neck supple.  Cardiovascular: Normal rate and normal heart sounds. An irregular rhythm present.  Pulmonary/Chest: No accessory muscle usage. Tachypnea noted. No respiratory distress. He has decreased breath sounds. He has wheezes. He has rales.  Abdominal: Soft. There is tenderness (mild diffuse tenderness).  Musculoskeletal: He exhibits edema (BLE edema).  Neurological: He is alert and oriented to person, place, and time.  Skin: Skin is warm and dry. He is not diaphoretic.  Nursing note and vitals reviewed.    ED Treatments / Results  Labs (all labs ordered are listed, but only abnormal results are displayed) Labs Reviewed  COMPREHENSIVE METABOLIC PANEL - Abnormal; Notable for the following components:      Result Value   Sodium 132 (*)    Chloride 95 (*)    CO2 21 (*)    BUN 35 (*)    Calcium 8.1 (*)    Total Protein 5.9 (*)    Albumin 3.0 (*)    AST 115 (*)    ALT 66 (*)    Alkaline Phosphatase 185 (*)    Anion gap 16 (*)    All other components within normal limits  BRAIN  NATRIURETIC PEPTIDE - Abnormal; Notable for the following components:   B Natriuretic Peptide 251.1 (*)    All other components within normal limits  TROPONIN I - Abnormal; Notable for the following components:   Troponin I 0.08 (*)    All other components within normal limits  CBC WITH DIFFERENTIAL/PLATELET - Abnormal; Notable for the following components:   WBC 2.9 (*)  RDW 16.4 (*)    Platelets 91 (*)    Lymphs Abs 0.3 (*)    All other components within normal limits  TROPONIN I - Abnormal; Notable for the following components:   Troponin I 0.05 (*)    All other components within normal limits  MAGNESIUM - Abnormal; Notable for the following components:   Magnesium 1.6 (*)    All other components within normal limits  I-STAT CG4 LACTIC ACID, ED - Abnormal; Notable for the following components:   Lactic Acid, Venous 1.96 (*)    All other components within normal limits  I-STAT ARTERIAL BLOOD GAS, ED - Abnormal; Notable for the following components:   pCO2 arterial 30.6 (*)    pO2, Arterial 59.0 (*)    All other components within normal limits  I-STAT CG4 LACTIC ACID, ED - Abnormal; Notable for the following components:   Lactic Acid, Venous 2.00 (*)    All other components within normal limits  CULTURE, BLOOD (ROUTINE X 2)  CULTURE, BLOOD (ROUTINE X 2)  RESPIRATORY PANEL BY PCR  CULTURE, EXPECTORATED SPUTUM-ASSESSMENT  PROTIME-INR  CORTISOL  PROCALCITONIN  PROCALCITONIN  TROPONIN I  TROPONIN I  LACTIC ACID, PLASMA  LACTIC ACID, PLASMA  CBC  BASIC METABOLIC PANEL  MAGNESIUM  HIV ANTIBODY (ROUTINE TESTING)  SEDIMENTATION RATE  HEMOGLOBIN A1C  LEGIONELLA PNEUMOPHILA SEROGP 1 UR AG  STREP PNEUMONIAE URINARY ANTIGEN  SODIUM, URINE, RANDOM    EKG  EKG Interpretation  Date/Time:  Thursday February 16 2017 15:52:59 EST Ventricular Rate:  92 PR Interval:    QRS Duration: 168 QT Interval:  410 QTC Calculation: 508 R Axis:   -85 Text Interpretation:  Atrial  fibrillation Right bundle branch block Anterolateral infarct, age indeterminate afib and LBBB new since 2012 Confirmed by Sherwood Gambler [phone]) on 02/16/2017 4:00:47 PM       Radiology Dg Chest 2 View  Result Date: 02/16/2017 CLINICAL DATA:  Cough and shortness of breath EXAM: CHEST  2 VIEW COMPARISON:  Chest radiograph February 18, 2010 and chest CT July 29, 2013 FINDINGS: There is extensive airspace consolidation throughout both mid and lower lung zones. There is a small left pleural effusion. There is cardiomegaly with pulmonary vascularity within normal limits. Port-A-Cath tip is in the superior vena cava. No pneumothorax. No bone lesions. No evident adenopathy. There is aortic atherosclerosis. IMPRESSION: Extensive airspace consolidation bilaterally, likely multifocal pneumonia. There may be a degree of superimposed pulmonary edema. There is cardiomegaly with small left pleural effusion. There is aortic atherosclerosis. Port-A-Cath tip is in the superior vena cava. Aortic Atherosclerosis (ICD10-I70.0). Electronically Signed   By: Lowella Grip III M.D.   On: 02/16/2017 17:05    Procedures .Critical Care Performed by: Sherwood Gambler, MD Authorized by: Sherwood Gambler, MD   Critical care provider statement:    Critical care time (minutes):  45   Critical care was necessary to treat or prevent imminent or life-threatening deterioration of the following conditions:  Circulatory failure, sepsis and respiratory failure   Critical care was time spent personally by me on the following activities:  Development of treatment plan with patient or surrogate, discussions with consultants, evaluation of patient's response to treatment, examination of patient, obtaining history from patient or surrogate, ordering and performing treatments and interventions, ordering and review of laboratory studies, ordering and review of radiographic studies, pulse oximetry and re-evaluation of patient's condition    (including critical care time)  Medications Ordered in ED Medications  sodium chloride 0.9 % bolus 1,000  mL (0 mLs Intravenous Stopped 02/16/17 1956)    And  sodium chloride 0.9 % bolus 1,000 mL (0 mLs Intravenous Paused 02/16/17 1956)    And  sodium chloride 0.9 % bolus 1,000 mL (not administered)    And  sodium chloride 0.9 % bolus 500 mL (not administered)  vancomycin (VANCOCIN) 1,250 mg in sodium chloride 0.9 % 250 mL IVPB (not administered)  sodium chloride flush (NS) 0.9 % injection 10-40 mL (not administered)  sodium chloride flush (NS) 0.9 % injection 10-40 mL (not administered)  heparin injection 5,000 Units (not administered)  pantoprazole (PROTONIX) injection 40 mg (not administered)  ipratropium-albuterol (DUONEB) 0.5-2.5 (3) MG/3ML nebulizer solution 3 mL (not administered)  aztreonam (AZACTAM) 1 g in dextrose 5 % 50 mL IVPB (not administered)  albuterol (PROVENTIL) (2.5 MG/3ML) 0.083% nebulizer solution 5 mg (5 mg Nebulization Given 02/16/17 1620)  ondansetron (ZOFRAN) injection 4 mg (4 mg Intravenous Given 02/16/17 1757)  levofloxacin (LEVAQUIN) IVPB 750 mg (0 mg Intravenous Stopped 02/16/17 1957)  vancomycin (VANCOCIN) 2,500 mg in sodium chloride 0.9 % 500 mL IVPB (2,500 mg Intravenous New Bag/Given 02/16/17 1958)  aztreonam (AZACTAM) 2 g in dextrose 5 % 50 mL IVPB (0 g Intravenous Stopped 02/16/17 1957)     Initial Impression / Assessment and Plan / ED Course  I have reviewed the triage vital signs and the nursing notes.  Pertinent labs & imaging results that were available during my care of the patient were reviewed by me and considered in my medical decision making (see chart for details).     Patient initially presents on CPAP but this was removed.  He seems to be protecting his airway but does appear tachypneic with some mild accessory muscle use.  His presentation could be pneumonia versus CHF versus a combination of the 2.  Some features such as the 3-week course suggest  some CHF given the progressive swelling and weight gain.  However cough with hemoptysis and brown sputum sounds more like pneumonia.  While in the ED he dropped his blood pressures into the 70s and was started on fluids.  After a liter and a half his blood pressure is better and thus his fluids will be stopped as there is concern for fluid overload as well.  He does continue to have increased work of breathing but does not appear to need BiPAP or intubation at this time.  However he appears to be quite ill at this time and thus ICU was consulted for admission.  He was given broad antibiotics for the pneumonia.  Admit to the ICU.  Final Clinical Impressions(s) / ED Diagnoses   Final diagnoses:  Abdominal distension  Wound infection  Acute respiratory failure with hypoxia ([name])  Community acquired pneumonia, unspecified laterality    ED Discharge Orders    None       Sherwood Gambler, MD 02/16/17 2227

## 2017-02-16 NOTE — ED Notes (Signed)
Patient transported to X-ray 

## 2017-02-16 NOTE — Consult Note (Deleted)
Name: [name] MRN: [id number] DOB: 02-13-1955    ADMISSION DATE:  02/16/2017 CONSULTATION DATE:  02/16/2017  REFERRING MD :  Dr. Regenia Skeeter  CHIEF COMPLAINT:  SOB  HISTORY OF PRESENT ILLNESS:   62 year old male with PMH significant for but not limited to stage III NHL (dx 06/2010, treated with CHOP/Rituxan, status post 8 cycles, last dose was given 07/19/2010; Maintenance therapy with Rituxan 375 mg/m2 given every 2 months for a total of 2 years, status post 12 cycleschemo, in remission, followed by Dr. Julien Nordmann), HTN, HLD, scoliosis, PVD, chronic bilateral lower foot wounds, and gout who presented with severe dyspnea and hypoxia.    States he has been taking care of his lower leg wounds himself since they occurred in the fall.  Prior foot wounds have been treated at the wound care center and usually take about a year to heal.  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  Did not get his flu shot this year. Primary physician is Dr. Brigitte Pulse.  Denies fever, history of connective tissue diseases, or blood clots.  Reports progressive swelling in his legs, left greater than right lower leg, and abdomen for several weeks which is abnormal for him.  On  2/2, felt suddenly worse with chills, short of breath, would fall asleep and wake gasping for air, generalized weakness, pale, coughing up blood tinged sputum, nauseated, headaches, and having lower abdominal pain; additionally reports onset of diarrhea today after taking a dose of linzess.  Seen in urgent care on Monday and diagnosed with bilateral pneumonia and placed on Levaquin.  Took 3 days of Levaquin which reports initially helped but then started to feel bad.  Seen again on Wednesday and given a shot of steroids.  It was recommended he go to the hospital but he refused at that time.    Presented to the ER by EMS found to be hypoxic in the 80's, placed on BiPAP, treated with nebs and solumedrol.  Additionally, found to be in new onset Afib.  In  ER, labs noted for Na 132, alk phos 185, AST 115, ALT 66, BNP 251, troponin 0.08, lactate 1.96 increasing to 2, WBC 2.9 (prior 7.8 in 10/2016), platelets 91.  CXR concerning for multifocal pneumonia and possible pulmonary edema.  Blood pressure initially soft improved after a liter of fluid.  Afib has been controlled rate. Placed on 3L [place] with ABG of 7.448/30.6/59 and remained dyspneic.  Treated empirically with vancomycin and azactam.  PCCM called for admission.    PAST MEDICAL HISTORY :   has a past medical history of Dyslipidemia, Gout attack (01/16/2012), Hypertension, nhl (dx'd 01/2010), and Venous insufficiency.  has a past surgical history that includes Inguinal hernia repair; Port-a-cath removal; Bronchoscopy; and Esophagogastroduodenoscopy endoscopy. Prior to Admission medications   Medication Sig Start Date End Date Taking? Authorizing Provider  allopurinol (ZYLOPRIM) 300 MG tablet Take 300 mg by mouth daily. 04/03/16  Yes [provider]  amLODipine (NORVASC) 5 MG tablet Take 5 mg by mouth daily.     Yes [provider]  atorvastatin (LIPITOR) 20 MG tablet Take 20 mg by mouth daily.  10/11/14  Yes [provider]  BENICAR HCT 40-25 MG per tablet Take 1 tablet by mouth daily.  02/05/11  Yes [provider]  carvedilol (COREG) 12.5 MG tablet Take 12.5 mg by mouth 2 (two) times daily with a meal.  01/26/13  Yes [name], MD  Cholecalciferol (VITAMIN D-3 PO) Take 1  tablet by mouth daily. 557m   Yes [provider]  CONTRAVE 8-90 MG TB12 Take 2 tablets by mouth 2 (two) times daily. 10/06/15  Yes [provider]  Ibuprofen (ADVIL) 200 MG CAPS Take 400 mg by mouth as needed (pain).    Yes [provider]  levofloxacin (LEVAQUIN) 750 MG tablet Take 750 mg by mouth daily. x7 days. 02/13/17  Yes [provider]  multivitamin-iron-minerals-folic acid (CENTRUM) chewable tablet Chew 1 tablet by mouth daily.   Yes [provider]  Omega-3 Fatty Acids (FISH OIL PO) Take 1 tablet by mouth daily.     Yes [provider]  ULORIC 40 MG tablet Take 40 mg by mouth daily. 10/28/16  Yes [provider]   Allergies  Allergen Reactions  . Penicillins Swelling    Age [age]- pt states he had swelling all over and his lips got real big    FAMILY HISTORY:  family history is not on file. SOCIAL HISTORY:  reports that he quit smoking about 5 years ago. He has a 10.00 pack-year smoking history. he has never used smokeless tobacco. He reports that he does not drink alcohol or use drugs.  REVIEW OF SYSTEMS:   Constitutional: Negative for fever, chills, weight loss, malaise/fatigue and diaphoresis.  Respiratory: Negative for cough, hemoptysis, sputum production, shortness of breath, wheezing and stridor.   Cardiovascular: Negative for chest pain, palpitations, orthopnea, claudication, leg swelling  Gastrointestinal: Negative for heartburn, nausea, vomiting, abdominal pain  Skin: Negative for rash.  Neurological: Negative for dizziness, speech change, focal weakness, loss of consciousness, and headaches.   SUBJECTIVE:   VITAL SIGNS: Temp:  [98.2 F (36.8 C)] 98.2 F (36.8 C) (02/07 1556) Pulse Rate:  [81-95] 81 (02/07 1946) Resp:  [10-36] 36 (02/07 1946) BP: (81-110)/(59-76) 109/67 (02/07 1946) SpO2:  [94 %-98 %] 96 % (02/07 1946) Weight:  [350 lb (158.8 kg)] 350 lb (158.8 kg) (02/07 1800)  PHYSICAL EXAMINATION: General:  Acutely ill obese adult male in moderate distress Neuro: Awake, alert, non-focal CV: ir ir PULM: speaks one word sentences, labored, productive cough with streaked blood, diffusely coarse with exp wheezes GI: obese, + BS, diffusely tender in lower abd  Extremities: wearing tight bilateral TED hose- removed to find BLE edema, right heel/ malleolus with 3x2.5 with red fleshy base, areas of necrosis and ischemia; sausage digits to left great and fifth toe, callus on plantar surface  of first with deep fissure that appears to pentrate to muscle with surrounding erythema Skin: no rashes   Recent Labs  Lab 02/16/17 1710  NA 132*  K 3.8  CL 95*  CO2 21*  BUN 35*  CREATININE 1.13  GLUCOSE 86   Recent Labs  Lab 02/16/17 1710  HGB 14.4  HCT 44.8  WBC 2.9*  PLT 91*   Dg Chest 2 View  Result Date: 02/16/2017 CLINICAL DATA:  Cough and shortness of breath EXAM: CHEST  2 VIEW COMPARISON:  Chest radiograph February 18, 2010 and chest CT July 29, 2013 FINDINGS: There is extensive airspace consolidation throughout both mid and lower lung zones. There is a small left pleural effusion. There is cardiomegaly with pulmonary vascularity within normal limits. Port-A-Cath tip is in the superior vena cava. No pneumothorax. No bone lesions. No evident adenopathy. There is aortic atherosclerosis. IMPRESSION: Extensive airspace consolidation bilaterally, likely multifocal pneumonia. There may be a degree of superimposed pulmonary edema. There is cardiomegaly with small left pleural effusion. There is aortic atherosclerosis.  Port-A-Cath tip is in the superior vena cava. Aortic Atherosclerosis (ICD10-I70.0). Electronically Signed   By: Lowella Grip III M.D.   On: 02/16/2017 17:05   STUDIES:  2/7 KUB >> 2/7 left foot xray >> 2/7 right foot xray >>  CULTURES: 2/7 BCx 2 >> 2/7 RVP >> 2/7 Sputum cx >>  ANTIBIOTICS: Levaquin 2/3 >2/5; 2/7 x 1 Vancomycin 2/7 >> Azactam 2/7 >>   SIGNIFICANT EVENTS: 2/7 Admit  LINES/TUBES: Left chest port >> accessed 2/7  DISCUSSION: 48 [name] with hx of NHL in remission presenting with several week hx of abd and BLE swelling with acute worsening of SOB, chills, abd pain, productive blood tinged sputum since 2/2 treated outpt with levaquin for bilateral pna x 3 days with worsening hypoxia and dyspnea.    ASSESSMENT / PLAN:  PULMONARY A: Acute hypoxic respiratory failure R/o multifocal pna +/- pulmonary edema, ddx consider PE, malignancy  P:    Admit to ICU for close monitoring, high risk for intubation Supplemental O2 for sats > 94% Duonebs q 4 prn See ID  CARDIOVASCULAR A:  New onset afib SIRS R/o acute heart failure Hx HTN, HLD, PVD - +troponin and BNP 251 P:  Tele monitoring Goal MAP > 65 Assess cortisol  Trend lactate Trend troponin  Assess TTE  RENAL A:   Hyponatremia P:   Check urine Na Hold further fluids for now Assess mag Trend BMP / urinary output/ daily weights Replace electrolytes as indicated  GASTROINTESTINAL A:   Transaminitis - mild  ABD pain P:   KUB Trend LFTs NPO protonix daily   HEMATOLOGIC A:   Leukocytopenia Thrombocytopenia R/o DVT  Hx NHL P:  Trend CBC Heparin SQ for VTE BLE venous duplex to r/o DVT   INFECTIOUS A:   Possible PNA Bilateral foot wounds P:   Sending blood and sputum cx Check PCT Xray bilateral feet r/o osteo Check sed rate Empiric vanc and azactam  Trend WBC/ fever curve Wound care consult; consider vascular consult   ENDOCRINE A:   No acute issues  P:   CBG q 4 while NPO Assess cortisol r/o adrenal insufficiency . Check HgbA1c with chronic nonhealing foot ulcers  NEUROLOGIC A:   No acute issues  P:   Monitor   FAMILY  - Updates: Patient lives alone.  Sister at bedside.  Both updated on plan of care by Dr. Jimmey Ralph.  Patient is acceptable to intubation if needed and wishes for everything to be done.    - Inter-disciplinary family meet or Palliative Care meeting due by:  2/14  CCT 60 mins   Kennieth Rad, AGACNP-BC [facility] Pgr: [phone] or if no answer [phone] 02/16/2017, 10:18 PM

## 2017-02-16 NOTE — ED Notes (Signed)
Admitting at bedside 

## 2017-02-16 NOTE — Progress Notes (Signed)
Pharmacy Antibiotic Note  [name] is a 62 y.o. male admitted on 02/16/2017 with pneumonia.  Pharmacy has been consulted for vancomycin dosing.  Plan: Vancomycin 2500mg  IV once then every 1250mg  IV q12 hours hours.  Goal trough 15-20 mcg/mL. Monitor clinical progression and LOT  Height: 5\' 11"  (180.3 cm) Weight: (!) 350 lb (158.8 kg) IBW/kg (Calculated) : 75.3  Temp (24hrs), Avg:98.2 F (36.8 C), Min:98.2 F (36.8 C), Max:98.2 F (36.8 C)  Recent Labs  Lab 02/16/17 1813  LATICACIDVEN 1.96*    CrCl cannot be calculated (Patient's most recent lab result is older than the maximum 21 days allowed.).    Allergies  Allergen Reactions  . Penicillins Swelling    Age 36 or 27   Thank you for allowing pharmacy to be a part of this patient's care.  [place] 02/16/2017 6:33 PM

## 2017-02-16 NOTE — H&P (Signed)
PULMONARY / CRITICAL CARE MEDICINE   Name: [name] MRN: [id number] DOB: 08/30/1955    ADMISSION DATE:  02/16/2017 CONSULTATION DATE:  02/16/2017  REFERRING MD :  Dr. Goldston  CHIEF COMPLAINT:  SOB  HISTORY OF PRESENT ILLNESS:   61 year old male with PMH significant for but not limited to stage III NHL (dx 06/2010, treated with CHOP/Rituxan, status post 8 cycles, last dose was given 07/19/2010; Maintenance therapy with Rituxan 375 mg/m2 given every 2 months for a total of 2 years, status post 12 cycleschemo, in remission, followed by Dr. Mohamed), HTN, HLD, scoliosis, PVD, chronic bilateral lower foot wounds, and gout who presented with severe dyspnea and hypoxia.    States he has been taking care of his lower leg wounds himself since they occurred in the fall.  Prior foot wounds have been treated at the wound care center and usually take about a year to heal.  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  Did not get his flu shot this year. Primary physician is Dr. [name].  Denies fever, history of connective tissue diseases, or blood clots.  Reports progressive swelling in his legs, left greater than right lower leg, and abdomen for several weeks which is abnormal for him.  On  2/2, felt suddenly worse with chills, short of breath, would fall asleep and wake gasping for air, generalized weakness, pale, coughing up blood tinged sputum, nauseated, headaches, and having lower abdominal pain; additionally reports onset of diarrhea today after taking a dose of linzess.  Seen in urgent care on Monday and diagnosed with bilateral pneumonia and placed on Levaquin.  Took 3 days of Levaquin which reports initially helped but then started to feel bad.  Seen again on Wednesday and given a shot of steroids.  It was recommended he go to the hospital but he refused at that time.    Presented to the ER by EMS found to be hypoxic in the 80's, placed on BiPAP, treated with nebs and solumedrol.  Additionally,  found to be in new onset Afib.  In ER, labs noted for Na 132, alk phos 185, AST 115, ALT 66, BNP 251, troponin 0.08, lactate 1.96 increasing to 2, WBC 2.9 (prior 7.8 in 10/2016), platelets 91.  CXR concerning for multifocal pneumonia and possible pulmonary edema.  Blood pressure initially soft improved after 1.5L [place].  Afib has been controlled rate. Placed on 3L [place] with ABG of 7.448/30.6/59 and remained dyspneic.  Treated empirically with vancomycin and azactam.  PCCM called for admission.    PAST MEDICAL HISTORY :  He  has a past medical history of Dyslipidemia, Gout attack (01/16/2012), Hypertension, nhl (dx'd 01/2010), and Venous insufficiency.  PAST SURGICAL HISTORY: He  has a past surgical history that includes Inguinal hernia repair; Port-a-cath removal; Bronchoscopy; and Esophagogastroduodenoscopy endoscopy.  Allergies  Allergen Reactions  . Penicillins Swelling    Age 10 or 11 - pt states he had swelling all over and his lips got real big    Current Facility-Administered Medications on File Prior to Encounter  Medication  . sodium chloride 0.9 % injection 10 mL   Current Outpatient Medications on File Prior to Encounter  Medication Sig  . allopurinol (ZYLOPRIM) 300 MG tablet Take 300 mg by mouth daily.  . amLODipine (NORVASC) 5 MG tablet Take 5 mg by mouth daily.    . atorvastatin (LIPITOR) 20 MG tablet Take 20 mg by mouth daily.   . BENICAR HCT 40-25 MG per   tablet Take 1 tablet by mouth daily.   . carvedilol (COREG) 12.5 MG tablet Take 12.5 mg by mouth 2 (two) times daily with a meal.   . Cholecalciferol (VITAMIN D-3 PO) Take 1 tablet by mouth daily. 500mg  . CONTRAVE 8-90 MG TB12 Take 2 tablets by mouth 2 (two) times daily.  . Ibuprofen (ADVIL) 200 MG CAPS Take 400 mg by mouth as needed (pain).   . levofloxacin (LEVAQUIN) 750 MG tablet Take 750 mg by mouth daily. x7 days.  . multivitamin-iron-minerals-folic acid (CENTRUM) chewable tablet Chew 1 tablet by mouth daily.  . Omega-3  Fatty Acids (FISH OIL PO) Take 1 tablet by mouth daily.    . ULORIC 40 MG tablet Take 40 mg by mouth daily.    FAMILY HISTORY:  His has no family status information on file.    SOCIAL HISTORY: He  reports that he quit smoking about 5 years ago. He has a 10.00 pack-year smoking history. he has never used smokeless tobacco. He reports that he does not drink alcohol or use drugs.  REVIEW OF SYSTEMS:   Constitutional: Negative for fever, chills, weight loss, malaise/fatigue and diaphoresis.  Respiratory: Negative for cough, hemoptysis, sputum production, shortness of breath, wheezing and stridor.   Cardiovascular: Negative for chest pain, palpitations, orthopnea, claudication, leg swelling  Gastrointestinal: Negative for heartburn, nausea, vomiting, abdominal pain  Skin: Negative for rash.  Neurological: Negative for dizziness, speech change, focal weakness, loss of consciousness, and headaches.   SUBJECTIVE:   VITAL SIGNS: BP 116/80   Pulse 86   Temp 98.2 F (36.8 C) (Oral)   Resp (!) 32   Ht 5' 11" (1.803 m)   Wt (!) 350 lb (158.8 kg)   SpO2 95%   BMI 48.82 kg/m   HEMODYNAMICS:   VENTILATOR SETTINGS:   INTAKE / OUTPUT: No intake/output data recorded.  PHYSICAL EXAMINATION: General:  Acutely ill obese adult male in moderate distress Neuro: Awake, alert, non-focal CV: ir ir PULM: speaks one word sentences, labored, productive cough with streaked blood, diffusely coarse with exp wheezes GI: obese, + BS, diffusely tender in lower abd  Extremities: wearing tight bilateral TED hose- removed to find BLE edema, right heel/ malleolus with 3x2.5 with red fleshy base, areas of necrosis and ischemia; sausage digits to left great and fifth toe, callus on plantar surface of first with deep fissure that appears to pentrate to muscle with surrounding erythema Skin: no rashes  LABS:  BMET Recent Labs  Lab 02/16/17 1710  NA 132*  K 3.8  CL 95*  CO2 21*  BUN 35*  CREATININE  1.13  GLUCOSE 86    Electrolytes Recent Labs  Lab 02/16/17 1710 02/16/17 2112  CALCIUM 8.1*  --   MG  --  1.6*    CBC Recent Labs  Lab 02/16/17 1710  WBC 2.9*  HGB 14.4  HCT 44.8  PLT 91*    Coag's Recent Labs  Lab 02/16/17 1710  INR 1.14    Sepsis Markers Recent Labs  Lab 02/16/17 1813 02/16/17 1944  LATICACIDVEN 1.96* 2.00*    ABG Recent Labs  Lab 02/16/17 1634  PHART 7.448  PCO2ART 30.6*  PO2ART 59.0*    Liver Enzymes Recent Labs  Lab 02/16/17 1710  AST 115*  ALT 66*  ALKPHOS 185*  BILITOT 1.0  ALBUMIN 3.0*    Cardiac Enzymes Recent Labs  Lab 02/16/17 1710 02/16/17 2112  TROPONINI 0.08* 0.05*    Glucose No results for input(s): GLUCAP in the   last 168 hours.  Imaging Dg Chest 2 View  Result Date: 02/16/2017 CLINICAL DATA:  Cough and shortness of breath EXAM: CHEST  2 VIEW COMPARISON:  Chest radiograph February 18, 2010 and chest CT July 29, 2013 FINDINGS: There is extensive airspace consolidation throughout both mid and lower lung zones. There is a small left pleural effusion. There is cardiomegaly with pulmonary vascularity within normal limits. Port-A-Cath tip is in the superior vena cava. No pneumothorax. No bone lesions. No evident adenopathy. There is aortic atherosclerosis. IMPRESSION: Extensive airspace consolidation bilaterally, likely multifocal pneumonia. There may be a degree of superimposed pulmonary edema. There is cardiomegaly with small left pleural effusion. There is aortic atherosclerosis. Port-A-Cath tip is in the superior vena cava. Aortic Atherosclerosis (ICD10-I70.0). Electronically Signed   By: Lowella Grip III M.D.   On: 02/16/2017 17:05   STUDIES:  2/7 KUB >> 2/7 left foot xray >> 2/7 right foot xray >>  CULTURES: 2/7 BCx 2 >> 2/7 RVP >> 2/7 Sputum cx >>  ANTIBIOTICS: Levaquin 2/3 >2/5; 2/7 x 1 Vancomycin 2/7 >> Azactam 2/7 >>   SIGNIFICANT EVENTS: 2/7 Admit  LINES/TUBES: Left chest port >>  accessed 2/7  DISCUSSION: 28 [name] with hx of NHL in remission presenting with several week hx of abd and BLE swelling with acute worsening of SOB, chills, abd pain, productive blood tinged sputum since 2/2 treated outpt with levaquin for bilateral pna x 3 days with worsening hypoxia and dyspnea.    ASSESSMENT / PLAN:  PULMONARY A: Acute hypoxic respiratory failure R/o multifocal pna +/- pulmonary edema, ddx consider PE, malignancy  P:   Admit to ICU for close monitoring, high risk for intubation Supplemental O2 for sats > 94% Duonebs q 4 prn See ID  CARDIOVASCULAR A:  New onset afib SIRS R/o acute heart failure Hx HTN, HLD, PVD - +troponin and BNP 251 P:  Tele monitoring Goal MAP > 65 Assess cortisol  Trend lactate Trend troponin  Assess TTE  RENAL A:   Hyponatremia P:   Check urine Na Hold further fluids for now Assess mag Trend BMP / urinary output/ daily weights Replace electrolytes as indicated  GASTROINTESTINAL A:   Transaminitis - mild  ABD pain P:   KUB for now, as patient can not lay flat for CT which would be ideal  Trend LFTs NPO protonix daily   HEMATOLOGIC A:   Leukocytopenia Thrombocytopenia R/o DVT  Hx NHL -  concern for relapse?  P:  Trend CBC Heparin SQ for VTE BLE venous duplex to r/o DVT   INFECTIOUS A:   Possible PNA Bilateral foot wounds P:   Sending blood and sputum cx Check PCT RVP, urine strep and legionella pending Xray bilateral feet r/o osteo Check sed rate Empiric vanc and azactam  Trend WBC/ fever curve Wound care consult; consider vascular consult   ENDOCRINE A:   No acute issues  P:   CBG q 4 while NPO Assess cortisol r/o adrenal insufficiency . Check HgbA1c with chronic nonhealing foot ulcers  NEUROLOGIC A:   No acute issues  P:   Monitor   FAMILY  - Updates: Patient lives alone.  Sister at bedside.  Both updated on plan of care by Dr. Jimmey Ralph.  Patient is acceptable to intubation if needed and  wishes for everything to be done.    - Inter-disciplinary family meet or Palliative Care meeting due by:  2/14  CCT 60 mins   Kennieth Rad, AGACNP-BC [facility] Pgr:  [phone] or if no answer [phone] 02/16/2017, 10:18 PM

## 2017-02-17 ENCOUNTER — Inpatient Hospital Stay (HOSPITAL_COMMUNITY): Payer: BLUE CROSS/BLUE SHIELD

## 2017-02-17 ENCOUNTER — Other Ambulatory Visit: Payer: Self-pay

## 2017-02-17 DIAGNOSIS — I5031 Acute diastolic (congestive) heart failure: Secondary | ICD-10-CM

## 2017-02-17 DIAGNOSIS — M7989 Other specified soft tissue disorders: Secondary | ICD-10-CM

## 2017-02-17 DIAGNOSIS — R0602 Shortness of breath: Secondary | ICD-10-CM

## 2017-02-17 DIAGNOSIS — I340 Nonrheumatic mitral (valve) insufficiency: Secondary | ICD-10-CM

## 2017-02-17 DIAGNOSIS — J101 Influenza due to other identified influenza virus with other respiratory manifestations: Secondary | ICD-10-CM

## 2017-02-17 LAB — CBC
HEMATOCRIT: 43.4 % (ref 39.0–52.0)
Hemoglobin: 14.1 g/dL (ref 13.0–17.0)
MCH: 30.5 pg (ref 26.0–34.0)
MCHC: 32.5 g/dL (ref 30.0–36.0)
MCV: 93.7 fL (ref 78.0–100.0)
PLATELETS: 94 10*3/uL — AB (ref 150–400)
RBC: 4.63 MIL/uL (ref 4.22–5.81)
RDW: 16.7 % — AB (ref 11.5–15.5)
WBC: 3.6 10*3/uL — AB (ref 4.0–10.5)

## 2017-02-17 LAB — ECHOCARDIOGRAM COMPLETE
EWDT: 123 ms
FS: 21 % — AB (ref 28–44)
Height: 71 in
IVS/LV PW RATIO, ED: 1.06
LA diam index: 1.36 cm/m2
LA vol A4C: 118 ml
LASIZE: 41 mm
LEFT ATRIUM END SYS DIAM: 41 mm
LV PW d: 14.3 mm — AB (ref 0.6–1.1)
LVOT area: 3.14 cm2
LVOTD: 20 mm
MV Dec: 123
MV Peak grad: 5 mmHg
MV pk E vel: 115 m/s
MVAP: 5.79 cm2
P 1/2 time: 36 ms
WEIGHTICAEL: 6049.42 [oz_av]

## 2017-02-17 LAB — RESPIRATORY PANEL BY PCR
Adenovirus: NOT DETECTED
Bordetella pertussis: NOT DETECTED
CORONAVIRUS HKU1-RVPPCR: NOT DETECTED
CORONAVIRUS NL63-RVPPCR: NOT DETECTED
CORONAVIRUS OC43-RVPPCR: NOT DETECTED
Chlamydophila pneumoniae: NOT DETECTED
Coronavirus 229E: NOT DETECTED
INFLUENZA A H1 2009-RVPPR: DETECTED — AB
INFLUENZA A H1-RVPPCR: NOT DETECTED
INFLUENZA B-RVPPCR: NOT DETECTED
Influenza A H3: NOT DETECTED
Influenza A: NOT DETECTED
METAPNEUMOVIRUS-RVPPCR: NOT DETECTED
MYCOPLASMA PNEUMONIAE-RVPPCR: NOT DETECTED
PARAINFLUENZA VIRUS 1-RVPPCR: NOT DETECTED
PARAINFLUENZA VIRUS 2-RVPPCR: NOT DETECTED
Parainfluenza Virus 3: NOT DETECTED
Parainfluenza Virus 4: NOT DETECTED
RESPIRATORY SYNCYTIAL VIRUS-RVPPCR: NOT DETECTED
Rhinovirus / Enterovirus: NOT DETECTED

## 2017-02-17 LAB — PROCALCITONIN: Procalcitonin: 0.37 ng/mL

## 2017-02-17 LAB — MAGNESIUM
MAGNESIUM: 1.6 mg/dL — AB (ref 1.7–2.4)
Magnesium: 1.9 mg/dL (ref 1.7–2.4)

## 2017-02-17 LAB — GLUCOSE, CAPILLARY
GLUCOSE-CAPILLARY: 127 mg/dL — AB (ref 65–99)
GLUCOSE-CAPILLARY: 204 mg/dL — AB (ref 65–99)
GLUCOSE-CAPILLARY: 155 mg/dL — AB (ref 65–99)
Glucose-Capillary: 124 mg/dL — ABNORMAL HIGH (ref 65–99)
Glucose-Capillary: 185 mg/dL — ABNORMAL HIGH (ref 65–99)

## 2017-02-17 LAB — BASIC METABOLIC PANEL
Anion gap: 12 (ref 5–15)
BUN: 27 mg/dL — AB (ref 6–20)
CALCIUM: 7.5 mg/dL — AB (ref 8.9–10.3)
CO2: 22 mmol/L (ref 22–32)
CREATININE: 0.97 mg/dL (ref 0.61–1.24)
Chloride: 96 mmol/L — ABNORMAL LOW (ref 101–111)
GFR calc non Af Amer: >60 mL/min (ref >60)
Glucose, Bld: 152 mg/dL — ABNORMAL HIGH (ref 65–99)
Potassium: 4.3 mmol/L (ref 3.5–5.1)
SODIUM: 130 mmol/L — AB (ref 135–145)

## 2017-02-17 LAB — COMPREHENSIVE METABOLIC PANEL
ALBUMIN: 2.6 g/dL — AB (ref 3.5–5.0)
ALT: 57 U/L (ref 17–63)
ANION GAP: 13 (ref 5–15)
AST: 100 U/L — ABNORMAL HIGH (ref 15–41)
Alkaline Phosphatase: 158 U/L — ABNORMAL HIGH (ref 38–126)
BUN: 27 mg/dL — ABNORMAL HIGH (ref 6–20)
CO2: 22 mmol/L (ref 22–32)
Calcium: 7.6 mg/dL — ABNORMAL LOW (ref 8.9–10.3)
Chloride: 96 mmol/L — ABNORMAL LOW (ref 101–111)
Creatinine, Ser: 1 mg/dL (ref 0.61–1.24)
GFR calc Af Amer: >60 mL/min (ref >60)
Glucose, Bld: 138 mg/dL — ABNORMAL HIGH (ref 65–99)
POTASSIUM: 3.9 mmol/L (ref 3.5–5.1)
Sodium: 131 mmol/L — ABNORMAL LOW (ref 135–145)
TOTAL PROTEIN: 5.4 g/dL — AB (ref 6.5–8.1)
Total Bilirubin: 0.9 mg/dL (ref 0.3–1.2)

## 2017-02-17 LAB — EXPECTORATED SPUTUM ASSESSMENT W GRAM STAIN, RFLX TO RESP C

## 2017-02-17 LAB — HEPARIN LEVEL (UNFRACTIONATED)
HEPARIN UNFRACTIONATED: 0.91 [IU]/mL — AB (ref 0.30–0.70)
HEPARIN UNFRACTIONATED: 0.86 [IU]/mL — AB (ref 0.30–0.70)
HEPARIN UNFRACTIONATED: 1.09 [IU]/mL — AB (ref 0.30–0.70)

## 2017-02-17 LAB — POCT I-STAT 3, ART BLOOD GAS (G3+)
ACID-BASE DEFICIT: 1 mmol/L (ref 0.0–2.0)
BICARBONATE: 24.3 mmol/L (ref 20.0–28.0)
O2 SAT: 89 %
PCO2 ART: 44.4 mmHg (ref 32.0–48.0)
PH ART: 7.351 (ref 7.350–7.450)
PO2 ART: 62 mmHg — AB (ref 83.0–108.0)
Patient temperature: 100.5
TCO2: 26 mmol/L (ref 22–32)

## 2017-02-17 LAB — SEDIMENTATION RATE: SED RATE: 10 mm/h (ref 0–16)

## 2017-02-17 LAB — TROPONIN I
Troponin I: 0.05 ng/mL (ref <0.03)
Troponin I: 0.06 ng/mL (ref <0.03)

## 2017-02-17 LAB — HIV ANTIBODY (ROUTINE TESTING W REFLEX): HIV SCREEN 4TH GENERATION: NONREACTIVE

## 2017-02-17 LAB — LACTIC ACID, PLASMA: LACTIC ACID, VENOUS: 1.7 mmol/L (ref 0.5–1.9)

## 2017-02-17 LAB — STREP PNEUMONIAE URINARY ANTIGEN: STREP PNEUMO URINARY ANTIGEN: NEGATIVE

## 2017-02-17 MED ORDER — MUPIROCIN CALCIUM 2 % EX CREA
TOPICAL_CREAM | Freq: Every day | CUTANEOUS | Status: DC
  Administered 2017-02-17 – 2017-02-22 (×6): 1 via TOPICAL
  Administered 2017-02-23 – 2017-02-24 (×2): via TOPICAL
  Administered 2017-02-25: 1 via TOPICAL
  Administered 2017-02-26 – 2017-03-05 (×8): via TOPICAL
  Administered 2017-03-06: 1 via TOPICAL
  Administered 2017-03-07 – 2017-03-14 (×8): via TOPICAL
  Administered 2017-03-15: 1 via TOPICAL
  Administered 2017-03-16 – 2017-03-22 (×7): via TOPICAL
  Filled 2017-02-17 (×2): qty 15

## 2017-02-17 MED ORDER — FUROSEMIDE 10 MG/ML IJ SOLN
40.0000 mg | Freq: Once | INTRAMUSCULAR | Status: AC
  Administered 2017-02-17: 40 mg via INTRAVENOUS
  Filled 2017-02-17: qty 4

## 2017-02-17 MED ORDER — HEPARIN (PORCINE) IN NACL 100-0.45 UNIT/ML-% IJ SOLN
2050.0000 [IU]/h | INTRAMUSCULAR | Status: DC
  Administered 2017-02-18: 1400 [IU]/h via INTRAVENOUS
  Administered 2017-02-19: 1000 [IU]/h via INTRAVENOUS
  Administered 2017-02-21: 2050 [IU]/h via INTRAVENOUS
  Administered 2017-02-21: 1750 [IU]/h via INTRAVENOUS
  Administered 2017-02-22 – 2017-02-23 (×4): 2050 [IU]/h via INTRAVENOUS
  Filled 2017-02-17 (×18): qty 250

## 2017-02-17 MED ORDER — FUROSEMIDE 10 MG/ML IJ SOLN
40.0000 mg | Freq: Four times a day (QID) | INTRAMUSCULAR | Status: AC
  Administered 2017-02-17 (×2): 40 mg via INTRAVENOUS
  Filled 2017-02-17 (×3): qty 4

## 2017-02-17 MED ORDER — SODIUM CHLORIDE 0.9 % IV SOLN
INTRAVENOUS | Status: DC | PRN
Start: 2017-02-17 — End: 2017-02-23
  Administered 2017-02-17: 19:00:00 via INTRA_ARTERIAL

## 2017-02-17 MED ORDER — PERFLUTREN LIPID MICROSPHERE
1.0000 mL | INTRAVENOUS | Status: AC | PRN
  Administered 2017-02-17: 3 mL via INTRAVENOUS
  Filled 2017-02-17: qty 10

## 2017-02-17 MED ORDER — INFLUENZA VAC SPLIT QUAD 0.5 ML IM SUSY
0.5000 mL | PREFILLED_SYRINGE | INTRAMUSCULAR | Status: DC
  Filled 2017-02-17: qty 0.5

## 2017-02-17 MED ORDER — ACETAMINOPHEN 325 MG PO TABS
650.0000 mg | ORAL_TABLET | Freq: Four times a day (QID) | ORAL | Status: DC | PRN
  Administered 2017-02-17 (×2): 650 mg via ORAL
  Filled 2017-02-17 (×2): qty 2

## 2017-02-17 MED ORDER — OSELTAMIVIR PHOSPHATE 75 MG PO CAPS
75.0000 mg | ORAL_CAPSULE | Freq: Two times a day (BID) | ORAL | Status: DC
  Administered 2017-02-17 – 2017-02-18 (×3): 75 mg via ORAL
  Filled 2017-02-17 (×3): qty 1

## 2017-02-17 MED ORDER — ONDANSETRON HCL 4 MG/2ML IJ SOLN
4.0000 mg | Freq: Four times a day (QID) | INTRAMUSCULAR | Status: DC | PRN
  Administered 2017-02-18 – 2017-03-18 (×2): 4 mg via INTRAVENOUS
  Filled 2017-02-17 (×2): qty 2

## 2017-02-17 MED ORDER — GUAIFENESIN ER 600 MG PO TB12
1200.0000 mg | ORAL_TABLET | Freq: Two times a day (BID) | ORAL | Status: DC
  Administered 2017-02-17 – 2017-02-20 (×4): 1200 mg via ORAL
  Filled 2017-02-17 (×8): qty 2

## 2017-02-17 MED ORDER — WHITE PETROLATUM EX OINT
TOPICAL_OINTMENT | CUTANEOUS | Status: AC
  Administered 2017-02-17: 15:00:00
  Filled 2017-02-17: qty 28.35

## 2017-02-17 NOTE — Progress Notes (Signed)
   02/17/17 1100  Clinical Encounter Type  Visited With Patient and family together  Visit Type Initial  Referral From Nurse  Consult/Referral To Chaplain  Spiritual Encounters  Spiritual Needs Brochure;Emotional  Stress Factors  Patient Stress Factors Exhausted  Family Stress Factors Exhausted   Pt was in bed with oxygen tube. Family present. Chaplain introduced the subject of AD to Pt but he was not in good mood to communicate about it. However pt was alert and willing. Chaplain provided the AD paperwork and explained to the sister on the way about of completing them. Chaplain also talked to the charge nurse about it and requested to be paged back for signing and notary should pt and family decide to proceed.  Tige Meas a Medical sales representative, [employer]

## 2017-02-17 NOTE — Procedures (Signed)
Arterial Catheter Insertion Procedure Note [name] [id number] 1955/03/27  Procedure: Insertion of Arterial Catheter  Indications: Blood pressure monitoring  Procedure Details Consent: Risks of procedure as well as the alternatives and risks of each were explained to the (patient/caregiver).  Consent for procedure obtained. Time Out: Verified patient identification, verified procedure, site/side was marked, verified correct patient position, special equipment/implants available, medications/allergies/relevent history reviewed, required imaging and test results available.  Performed  Maximum sterile technique was used including antiseptics, cap, gloves, gown, hand hygiene, mask and sheet. Skin prep: Chlorhexidine; local anesthetic administered 22 gauge catheter was inserted into right radial artery using the Seldinger technique.  Evaluation Blood flow good; BP tracing good. Complications: No apparent complications.   Marissa Nestle 02/17/2017

## 2017-02-17 NOTE — Progress Notes (Signed)
[facility]  Afternoon rounds: Oxygen needs still elevated.  He is conversant with me, joking some.  He says that dyspnea comes and goes, not worse.  Cough about the same.  No accessory muscle use, he is currently speaking in full sentences.    Will continue diuresis and close monitoring in ICU  Roselie Awkward, MD [place] [facility] Pager: [phone] Cell: [phone] After 3pm or if no response, call [phone]

## 2017-02-17 NOTE — Progress Notes (Signed)
  Echocardiogram 2D Echocardiogram has been performed.  [name] G Bran Aldridge 02/17/2017, 3:16 PM

## 2017-02-17 NOTE — Progress Notes (Signed)
ANTICOAGULATION CONSULT NOTE - Follow Up Consult  Pharmacy Consult for Heparin Indication: atrial fibrillation  Allergies  Allergen Reactions  . Penicillins Swelling    Age 62 or 76 - pt states he had swelling all over and his lips got real big    Patient Measurements: Height: 5\' 11"  (180.3 cm) Weight: (!) 378 lb 1.4 oz (171.5 kg) IBW/kg (Calculated) : 75.3 Heparin Dosing Weight: 115 kg  Vital Signs: Temp: 100.5 F (38.1 C) (02/08 1951) Temp Source: Oral (02/08 1951) BP: 113/96 (02/08 1800) Pulse Rate: 87 (02/08 1900)  Labs: Recent Labs    02/16/17 1710 02/16/17 2112 02/17/17 0611 02/17/17 0612 02/17/17 0912 02/17/17 1232 02/17/17 1956 02/17/17 2100  HGB 14.4  --   --  14.1  --   --   --   --   HCT 44.8  --   --  43.4  --   --   --   --   PLT 91*  --   --  94*  --   --   --   --   LABPROT 14.6  --   --   --   --   --   --   --   INR 1.14  --   --   --   --   --   --   --   HEPARINUNFRC  --   --  0.91*  --   --  0.86*  --  1.09*  CREATININE 1.13  --   --  1.00  --   --  0.97  --   TROPONINI 0.08* 0.05*  --  0.05* 0.06*  --   --   --     Estimated Creatinine Clearance: 128.7 mL/min (by C-G formula based on SCr of 0.97 mg/dL).   Assessment: 62 year old male with recent pneumonia, brought from home by EMS for SOB and found to be in new atrial fibrillation. Pharmacy consulted for IV Heparin dosing.  Heparin level is 1.09 after decreasing drip rate to 1600 units/hr. H/H is within normal limits. Platelets were low on admission at 91, currently 94. No bleeding noted.   No issues per rn no bleeding Lab was drawn from opposite arm of infusion  Goal of Therapy:  Heparin level 0.3-0.7 units/ml Monitor platelets by anticoagulation protocol: Yes   Plan:  Decrease Heparin to 1400 units/hr.  Recheck Heparin level in 6 hours.  Daily Heparin level and CBC  Levester Fresh, PharmD, BCPS, BCCCP Clinical Pharmacist Clinical phone for 02/17/2017 from [phone]: [phone] If  after 2300, please call main pharmacy at: [phone] 02/17/2017 10:12 PM

## 2017-02-17 NOTE — Consult Note (Addendum)
[facility] Nurse wound consult note Reason for Consult: Consult requested for BLE.  Pt states he has been followed by the outpatient wound care center in the past, but has not been going recently.   Wound type: Left plantar anterior foot with chronic full thickness wound; 1.8X.4X.3cm, red dry wound bed surrounded by dry callous.  No odor or fulctuance, scant amt tan drainage. Trimmed callous with scalpel, patient tolerated without pain or bleeding. Next to this site; there is a partial thickness wound; 1X1X.1cm, red and dry, no odor, drainage, or fluctuance. Left outer foot with dry callous; .5X.5cm, no odor, drainage, or fluctuance. Right ankle with chronic full thickness wound; 2X3X.2cm, dark red wound edges, dry red woundbed, no odor or fluctuance, minimal amt tan drainage. Dressing procedure/placement/frequency: Bactroban to promote moist healing.  Pt states he was wearing compression stockings prior to admission. Discussed plan of care and he verbalized understanding. Please re-consult if further assistance is needed.  Thank-you,  Julien Girt MSN, [place], [place], [place], [place]

## 2017-02-17 NOTE — Progress Notes (Signed)
Bilateral lower extremity venous duplex completed. Technically limited due to body habitus. Unable to visualize all segments. The segments visualized show no obvious evidence of DVT or superficial thrombosis. No evidence of Baker's cyst. Moderate interstitial noted from the popliteal fossa into the calf. Inconclusive exam. Toma Copier, RVS 02/17/2017 4:59 PM

## 2017-02-17 NOTE — Progress Notes (Signed)
PULMONARY / CRITICAL CARE MEDICINE   Name: [name] MRN: [id number] DOB: January 19, 1955    ADMISSION DATE:  02/16/2017 CONSULTATION DATE:  02/16/2017  REFERRING MD:  Regenia Skeeter  CHIEF COMPLAINT:  DYspnea  HISTORY OF PRESENT ILLNESS:   62y/o male with a history of lymphoma in remission, last treated in 2012 who was admitted with acute hypoxemic respiratory failure in the setting of influenza and likely pulmonary edema.     SUBJECTIVE:  Feels a little better this morning Still coughing, coughing up blood   VITAL SIGNS: BP 101/84   Pulse 86   Temp 98.5 F (36.9 C) (Oral)   Resp (!) 28   Ht 5\' 11"  (1.803 m)   Wt (!) 378 lb 1.4 oz (171.5 kg)   SpO2 92%   BMI 52.73 kg/m   HEMODYNAMICS:    VENTILATOR SETTINGS:    INTAKE / OUTPUT: I/O last 3 completed shifts: In: 2671.6 [I.V.:621.6; IV [id number]] Out: 1400 [Urine:1400]  PHYSICAL EXAMINATION:  General:  Resting in bed, mild tachypnea but speaking in full sentences HENT: NCAT OP clear PULM: Crackles bases bilaterally B, normal effort CV: Irreg irreg no mgr GI: BS+, soft, nontender MSK: bilateral foot wounds dressed Neuro: awake, alert, no distress, MAEW   LABS:  BMET Recent Labs  Lab 02/16/17 1710 02/17/17 0612  NA 132* 131*  K 3.8 3.9  CL 95* 96*  CO2 21* 22  BUN 35* 27*  CREATININE 1.13 1.00  GLUCOSE 86 138*    Electrolytes Recent Labs  Lab 02/16/17 1710 02/16/17 2112 02/17/17 0612  CALCIUM 8.1*  --  7.6*  MG  --  1.6* 1.9    CBC Recent Labs  Lab 02/16/17 1710 02/17/17 0612  WBC 2.9* 3.6*  HGB 14.4 14.1  HCT 44.8 43.4  PLT 91* 94*    Coag's Recent Labs  Lab 02/16/17 1710  INR 1.14    Sepsis Markers Recent Labs  Lab 02/16/17 1944 02/16/17 2112 02/16/17 2300 02/17/17 0015  LATICACIDVEN 2.00*  --  1.8 1.7  PROCALCITON  --  0.33  --   --     ABG Recent Labs  Lab 02/16/17 1634  PHART 7.448  PCO2ART 30.6*  PO2ART 59.0*    Liver Enzymes Recent Labs  Lab  02/16/17 1710 02/17/17 0612  AST 115* 100*  ALT 66* 57  ALKPHOS 185* 158*  BILITOT 1.0 0.9  ALBUMIN 3.0* 2.6*    Cardiac Enzymes Recent Labs  Lab 02/16/17 1710 02/16/17 2112 02/17/17 0612  TROPONINI 0.08* 0.05* 0.05*    Glucose Recent Labs  Lab 02/17/17 0352 02/17/17 0718  GLUCAP 124* 127*    Imaging Dg Chest 2 View  Result Date: 02/16/2017 CLINICAL DATA:  Cough and shortness of breath EXAM: CHEST  2 VIEW COMPARISON:  Chest radiograph February 18, 2010 and chest CT July 29, 2013 FINDINGS: There is extensive airspace consolidation throughout both mid and lower lung zones. There is a small left pleural effusion. There is cardiomegaly with pulmonary vascularity within normal limits. Port-A-Cath tip is in the superior vena cava. No pneumothorax. No bone lesions. No evident adenopathy. There is aortic atherosclerosis. IMPRESSION: Extensive airspace consolidation bilaterally, likely multifocal pneumonia. There may be a degree of superimposed pulmonary edema. There is cardiomegaly with small left pleural effusion. There is aortic atherosclerosis. Port-A-Cath tip is in the superior vena cava. Aortic Atherosclerosis (ICD10-I70.0). Electronically Signed   By: Lowella Grip III M.D.   On: 02/16/2017 17:05   Dg Abd 1 View  Result Date:  02/16/2017 CLINICAL DATA:  Initial evaluation for acute abdominal pain with distention for 2 weeks. EXAM: ABDOMEN - 1 VIEW COMPARISON:  Prior CT from 12/06/2010. FINDINGS: Visualized bowel gas pattern within normal limits without obstruction or ileus. No appreciable free air on these limited views of the abdomen. No abnormal bowel wall thickening. No appreciable soft tissue mass. Prominent vascular calcifications noted within the upper abdomen. No other abnormal calcific densities. Scoliosis with advanced degenerative spondylolysis seen within the visualized spine. Prominent osteoarthritic changes noted about the hips is well. IMPRESSION: 1. Nonobstructive bowel  gas pattern with no radiographic evidence for acute intra-abdominal pathology. 2. Atherosclerosis. 3. Scoliosis with advanced multilevel degenerative spondylolysis. Electronically Signed   By: Jeannine Boga M.D.   On: 02/16/2017 22:29   Dg Foot 2 Views Left  Result Date: 02/16/2017 CLINICAL DATA:  Initial evaluation for chronic bilateral lower foot wounds with gout. EXAM: LEFT FOOT - 2 VIEW COMPARISON:  None. FINDINGS: No acute fracture or dislocation. Extensive degenerative changes noted throughout the left midfoot. Bones are somewhat osteopenic. Soft tissue defect overlies the medial aspect of the left first MTP joint, suspicious for ulceration. No radiographic evidence for osteomyelitis. No dissecting soft tissue emphysema or radiopaque foreign body. Prominent soft tissue calcifications noted within the visualized left lower leg. Plantar calcaneal enthesophyte. Pes planus deformity. IMPRESSION: Soft tissue ulceration adjacent to the left first IP joint. No radiographic evidence for osteomyelitis. 1. No other acute osseous abnormality about the foot. 2. Prominent degenerative osteoarthritic changes at the left midfoot. Electronically Signed   By: Jeannine Boga M.D.   On: 02/16/2017 22:36   Dg Foot 2 Views Right  Result Date: 02/16/2017 CLINICAL DATA:  Initial evaluation for chronic bilateral lower foot wounds with gout. EXAM: RIGHT FOOT - 2 VIEW COMPARISON:  None. FINDINGS: No acute fracture or dislocation. Prominent degenerative spurring noted at the dorsal midfoot. Posterior calcaneal enthesophytes noted. No radiographic evidence for osteomyelitis. No acute soft tissue abnormality. No appreciable soft tissue ulceration. Surgical clips with scattered soft tissue calcifications noted within the visualized lower right leg. IMPRESSION: 1. No acute osseous abnormality about the right foot. 2. Prominent degenerative osteoarthritic changes at the right midfoot. Electronically Signed   By: Jeannine Boga M.D.   On: 02/16/2017 22:33      STUDIES:  2/8 echo >    CULTURES: 2/7 BCx 2 >> 2/7 RVP >> influenza A 2/7 Sputum cx >>  ANTIBIOTICS: Levaquin 2/3 >2/5; 2/7 x 1 Vancomycin 2/7 >> Azactam 2/7 >>  Tamiflu 2/8 >   SIGNIFICANT EVENTS: 2/7 Admit  LINES/TUBES: Left chest port >> accessed 2/7   DISCUSSION: 62 y/o male with acute hypoxemic respiratory failure due to severe CAP, may have diastolic heart failure.    ASSESSMENT / PLAN:  PULMONARY A: Acute respiratory failure with hypoxemia Acute pulmonary edema Infl[place] A P:   Continue O2 to maintain O2 saturation > 90% Incentive spirometry  Add guaifenesin Lasix today Monitor closely in ICU  CARDIOVASCULAR A:  Heart failure? proBNP elevated, clinical history supportive Received 3.5 L on 2/8 for "sepsis" P:  Check echocardiogram today Lasix tele  RENAL A:   No acute issues P:   Monitor BMET and UOP Replace electrolytes as needed   GASTROINTESTINAL A:   No acute issues P:   Advance diet  HEMATOLOGIC A:   Mild thrombocytopenia History of non-hodgkins lymphoma P:  Monitor for bleeding  INFECTIOUS A:   Severe CAP Influenza Bilateral foot wounds P:   Continue antibiotics, consider  stopping on 2/9 if resp cultures negative Start tamiflu Wound team consult  ENDOCRINE A:   DM2   P:   SSI  NEUROLOGIC A:   No acute issues P:   Minimize sedating meds   FAMILY  - Updates: none bedside  - Inter-disciplinary family meet or Palliative Care meeting due by:  day 7  My cc time 35 minutes  Roselie Awkward, MD [facility] Pager: [phone] Cell: [phone] After 3pm or if no response, call [phone]    02/17/2017, 8:35 AM

## 2017-02-17 NOTE — Progress Notes (Signed)
Checked on patient. He has needed more O2 over the course of the day, now 88% on 10L. Less tachypneic than on admission, able to speak in short sentences. However more crackles now. Also more sleepy than on admission, although patient says this is because he hasn't slept in days. Will obtain ABG now. Start BIPAP for work of breathing and hypoxia, regardless what ABG results are. Made patient NPO; he is not happy about this.

## 2017-02-17 NOTE — Progress Notes (Signed)
ANTICOAGULATION CONSULT NOTE - Follow Up Consult  Pharmacy Consult for Heparin Indication: atrial fibrillation  Allergies  Allergen Reactions  . Penicillins Swelling    Age 62 or 54 - pt states he had swelling all over and his lips got real big    Patient Measurements: Height: 5\' 11"  (180.3 cm) Weight: (!) 378 lb 1.4 oz (171.5 kg) IBW/kg (Calculated) : 75.3 Heparin Dosing Weight: 115 kg  Vital Signs: Temp: 100.4 F (38 C) (02/08 1136) Temp Source: Oral (02/08 1136) BP: 115/83 (02/08 1200) Pulse Rate: 88 (02/08 1300)  Labs: Recent Labs    02/16/17 1710 02/16/17 2112 02/17/17 0611 02/17/17 0612 02/17/17 0912 02/17/17 1232  HGB 14.4  --   --  14.1  --   --   HCT 44.8  --   --  43.4  --   --   PLT 91*  --   --  94*  --   --   LABPROT 14.6  --   --   --   --   --   INR 1.14  --   --   --   --   --   HEPARINUNFRC  --   --  0.91*  --   --  0.86*  CREATININE 1.13  --   --  1.00  --   --   TROPONINI 0.08* 0.05*  --  0.05* 0.06*  --     Estimated Creatinine Clearance: 124.9 mL/min (by C-G formula based on SCr of 1 mg/dL).   Assessment: 62 year old male with recent pneumonia, brought from home by EMS for SOB and found to be in new atrial fibrillation. Pharmacy consulted for IV Heparin dosing.  Heparin level is 0.86 after decreasing drip rate to 1800 units/hr. H/H is within normal limits. Platelets were low on admission at 91, currently 94. No bleeding noted.   Goal of Therapy:  Heparin level 0.3-0.7 units/ml Monitor platelets by anticoagulation protocol: Yes   Plan:  Decrease Heparin to 1600 units/hr.  Recheck Heparin level in 6 hours.  Daily Heparin level and CBC  [name], PharmD, BCPS, BCCCP Clinical Pharmacist Clinical phone 02/17/2017 until 3:30PM - [phone] After hours, please call #[phone] 02/17/2017,1:51 PM

## 2017-02-18 ENCOUNTER — Inpatient Hospital Stay (HOSPITAL_COMMUNITY): Payer: BLUE CROSS/BLUE SHIELD

## 2017-02-18 DIAGNOSIS — J810 Acute pulmonary edema: Secondary | ICD-10-CM

## 2017-02-18 DIAGNOSIS — J80 Acute respiratory distress syndrome: Secondary | ICD-10-CM

## 2017-02-18 LAB — POCT I-STAT 3, ART BLOOD GAS (G3+)
Acid-base deficit: 2 mmol/L (ref 0.0–2.0)
Bicarbonate: 28.1 mmol/L — ABNORMAL HIGH (ref 20.0–28.0)
Bicarbonate: 26.9 mmol/L (ref 20.0–28.0)
O2 SAT: 82 %
O2 Saturation: 96 %
PCO2 ART: 66.4 mmHg — AB (ref 32.0–48.0)
PH ART: 7.234 — AB (ref 7.350–7.450)
PH ART: 7.347 — AB (ref 7.350–7.450)
PO2 ART: 86 mmHg (ref 83.0–108.0)
TCO2: 30 mmol/L (ref 22–32)
TCO2: 28 mmol/L (ref 22–32)
pCO2 arterial: 49 mmHg — ABNORMAL HIGH (ref 32.0–48.0)
pO2, Arterial: 57 mmHg — ABNORMAL LOW (ref 83.0–108.0)

## 2017-02-18 LAB — BASIC METABOLIC PANEL
ANION GAP: 12 (ref 5–15)
ANION GAP: 10 (ref 5–15)
BUN: 25 mg/dL — ABNORMAL HIGH (ref 6–20)
BUN: 31 mg/dL — ABNORMAL HIGH (ref 6–20)
CALCIUM: 7.6 mg/dL — AB (ref 8.9–10.3)
CALCIUM: 7.4 mg/dL — AB (ref 8.9–10.3)
CO2: 25 mmol/L (ref 22–32)
CO2: 24 mmol/L (ref 22–32)
CREATININE: 1.11 mg/dL (ref 0.61–1.24)
Chloride: 95 mmol/L — ABNORMAL LOW (ref 101–111)
Chloride: 99 mmol/L — ABNORMAL LOW (ref 101–111)
Creatinine, Ser: 0.94 mg/dL (ref 0.61–1.24)
GLUCOSE: 138 mg/dL — AB (ref 65–99)
GLUCOSE: 168 mg/dL — AB (ref 65–99)
POTASSIUM: 4.1 mmol/L (ref 3.5–5.1)
Potassium: 4.7 mmol/L (ref 3.5–5.1)
Sodium: 132 mmol/L — ABNORMAL LOW (ref 135–145)
Sodium: 133 mmol/L — ABNORMAL LOW (ref 135–145)

## 2017-02-18 LAB — GLUCOSE, CAPILLARY
GLUCOSE-CAPILLARY: 130 mg/dL — AB (ref 65–99)
GLUCOSE-CAPILLARY: 125 mg/dL — AB (ref 65–99)
GLUCOSE-CAPILLARY: 151 mg/dL — AB (ref 65–99)
Glucose-Capillary: 147 mg/dL — ABNORMAL HIGH (ref 65–99)
Glucose-Capillary: 152 mg/dL — ABNORMAL HIGH (ref 65–99)
Glucose-Capillary: 153 mg/dL — ABNORMAL HIGH (ref 65–99)

## 2017-02-18 LAB — CBC
HCT: 50.8 % (ref 39.0–52.0)
HEMATOCRIT: 47.5 % (ref 39.0–52.0)
HEMOGLOBIN: 16.3 g/dL (ref 13.0–17.0)
Hemoglobin: 15.3 g/dL (ref 13.0–17.0)
MCH: 30.5 pg (ref 26.0–34.0)
MCH: 30.9 pg (ref 26.0–34.0)
MCHC: 32.2 g/dL (ref 30.0–36.0)
MCHC: 32.1 g/dL (ref 30.0–36.0)
MCV: 94.8 fL (ref 78.0–100.0)
MCV: 96.2 fL (ref 78.0–100.0)
Platelets: 85 10*3/uL — ABNORMAL LOW (ref 150–400)
Platelets: 93 10*3/uL — ABNORMAL LOW (ref 150–400)
RBC: 5.01 MIL/uL (ref 4.22–5.81)
RBC: 5.28 MIL/uL (ref 4.22–5.81)
RDW: 17 % — ABNORMAL HIGH (ref 11.5–15.5)
RDW: 17.1 % — ABNORMAL HIGH (ref 11.5–15.5)
WBC: 6.5 10*3/uL (ref 4.0–10.5)
WBC: 8.7 10*3/uL (ref 4.0–10.5)

## 2017-02-18 LAB — PHOSPHORUS
PHOSPHORUS: 2.2 mg/dL — AB (ref 2.5–4.6)
PHOSPHORUS: 2.1 mg/dL — AB (ref 2.5–4.6)

## 2017-02-18 LAB — PROCALCITONIN: Procalcitonin: 0.39 ng/mL

## 2017-02-18 LAB — HEPARIN LEVEL (UNFRACTIONATED)
HEPARIN UNFRACTIONATED: 0.97 [IU]/mL — AB (ref 0.30–0.70)
HEPARIN UNFRACTIONATED: 0.7 [IU]/mL (ref 0.30–0.70)
Heparin Unfractionated: 0.92 IU/mL — ABNORMAL HIGH (ref 0.30–0.70)

## 2017-02-18 LAB — TRIGLYCERIDES: TRIGLYCERIDES: 149 mg/dL (ref <150)

## 2017-02-18 LAB — MAGNESIUM
Magnesium: 1.7 mg/dL (ref 1.7–2.4)
Magnesium: 1.9 mg/dL (ref 1.7–2.4)

## 2017-02-18 LAB — LEGIONELLA PNEUMOPHILA SEROGP 1 UR AG: L. pneumophila Serogp 1 Ur Ag: NEGATIVE

## 2017-02-18 MED ORDER — CHLORHEXIDINE GLUCONATE 0.12% ORAL RINSE (MEDLINE KIT)
15.0000 mL | Freq: Two times a day (BID) | OROMUCOSAL | Status: DC
  Administered 2017-02-18 – 2017-03-22 (×61): 15 mL via OROMUCOSAL

## 2017-02-18 MED ORDER — PROPOFOL 1000 MG/100ML IV EMUL
5.0000 ug/kg/min | INTRAVENOUS | Status: DC
  Administered 2017-02-18: 20 ug/kg/min via INTRAVENOUS

## 2017-02-18 MED ORDER — SODIUM GLYCEROPHOSPHATE 1 MMOLE/ML IV SOLN
20.0000 mmol | Freq: Once | INTRAVENOUS | Status: AC
  Administered 2017-02-19: 20 mmol via INTRAVENOUS
  Filled 2017-02-18: qty 20

## 2017-02-18 MED ORDER — POTASSIUM CHLORIDE CRYS ER 20 MEQ PO TBCR
40.0000 meq | EXTENDED_RELEASE_TABLET | Freq: Three times a day (TID) | ORAL | Status: DC
  Filled 2017-02-18: qty 2

## 2017-02-18 MED ORDER — MIDAZOLAM HCL 2 MG/2ML IJ SOLN
2.0000 mg | Freq: Once | INTRAMUSCULAR | Status: AC
  Administered 2017-02-18: 2 mg via INTRAVENOUS

## 2017-02-18 MED ORDER — FENTANYL CITRATE (PF) 100 MCG/2ML IJ SOLN
INTRAMUSCULAR | Status: AC
  Administered 2017-02-18: 100 ug via INTRAVENOUS
  Filled 2017-02-18: qty 2

## 2017-02-18 MED ORDER — MAGNESIUM SULFATE 2 GM/50ML IV SOLN
2.0000 g | Freq: Once | INTRAVENOUS | Status: AC
  Administered 2017-02-18: 2 g via INTRAVENOUS
  Filled 2017-02-18: qty 50

## 2017-02-18 MED ORDER — NOREPINEPHRINE BITARTRATE 1 MG/ML IV SOLN
0.0000 ug/min | Freq: Once | INTRAVENOUS | Status: AC
  Administered 2017-02-18: 2 ug/min via INTRAVENOUS
  Filled 2017-02-18: qty 4

## 2017-02-18 MED ORDER — SODIUM CHLORIDE 0.9 % IV BOLUS (SEPSIS)
250.0000 mL | Freq: Once | INTRAVENOUS | Status: AC
  Administered 2017-02-18: 250 mL via INTRAVENOUS

## 2017-02-18 MED ORDER — ETOMIDATE 2 MG/ML IV SOLN
20.0000 mg | Freq: Once | INTRAVENOUS | Status: AC
  Administered 2017-02-18: 20 mg via INTRAVENOUS

## 2017-02-18 MED ORDER — NOREPINEPHRINE BITARTRATE 1 MG/ML IV SOLN
0.0000 ug/min | INTRAVENOUS | Status: DC
  Administered 2017-02-19: 5 ug/min via INTRAVENOUS
  Administered 2017-02-19: 16 ug/min via INTRAVENOUS
  Administered 2017-02-19: 6 ug/min via INTRAVENOUS
  Filled 2017-02-18 (×4): qty 4

## 2017-02-18 MED ORDER — SODIUM CHLORIDE 0.9 % IV SOLN
0.4000 ug/kg/h | INTRAVENOUS | Status: DC
  Administered 2017-02-18: 0.4 ug/kg/h via INTRAVENOUS
  Filled 2017-02-18 (×3): qty 2

## 2017-02-18 MED ORDER — ACETAMINOPHEN 325 MG PO TABS
650.0000 mg | ORAL_TABLET | Freq: Four times a day (QID) | ORAL | Status: DC | PRN
  Administered 2017-02-18 – 2017-03-06 (×13): 650 mg
  Filled 2017-02-18 (×14): qty 2

## 2017-02-18 MED ORDER — NOREPINEPHRINE BITARTRATE 1 MG/ML IV SOLN
0.0000 ug/min | Freq: Once | INTRAVENOUS | Status: AC
  Administered 2017-02-18: 16 ug/min via INTRAVENOUS
  Filled 2017-02-18: qty 4

## 2017-02-18 MED ORDER — FENTANYL 2500MCG IN NS 250ML (10MCG/ML) PREMIX INFUSION
25.0000 ug/h | INTRAVENOUS | Status: DC
  Administered 2017-02-18: 50 ug/h via INTRAVENOUS
  Administered 2017-02-19: 300 ug/h via INTRAVENOUS
  Administered 2017-02-19: 250 ug/h via INTRAVENOUS
  Administered 2017-02-19: 200 ug/h via INTRAVENOUS
  Administered 2017-02-20: 275 ug/h via INTRAVENOUS
  Administered 2017-02-20: 200 ug/h via INTRAVENOUS
  Administered 2017-02-21 (×2): 400 ug/h via INTRAVENOUS
  Administered 2017-02-21: 250 ug/h via INTRAVENOUS
  Administered 2017-02-22 – 2017-02-23 (×6): 400 ug/h via INTRAVENOUS
  Filled 2017-02-18 (×16): qty 250

## 2017-02-18 MED ORDER — POTASSIUM CHLORIDE 20 MEQ/15ML (10%) PO SOLN
40.0000 meq | Freq: Once | ORAL | Status: AC
  Administered 2017-02-18: 40 meq via ORAL
  Filled 2017-02-18: qty 30

## 2017-02-18 MED ORDER — FUROSEMIDE 10 MG/ML IJ SOLN
40.0000 mg | Freq: Four times a day (QID) | INTRAMUSCULAR | Status: AC
  Administered 2017-02-18 – 2017-02-19 (×3): 40 mg via INTRAVENOUS
  Filled 2017-02-18 (×4): qty 4

## 2017-02-18 MED ORDER — FENTANYL CITRATE (PF) 100 MCG/2ML IJ SOLN: 50.0000 ug | Freq: Once | INTRAMUSCULAR | Status: DC

## 2017-02-18 MED ORDER — WHITE PETROLATUM EX OINT
TOPICAL_OINTMENT | CUTANEOUS | Status: AC
  Administered 2017-02-18: 1
  Filled 2017-02-18: qty 28.35

## 2017-02-18 MED ORDER — PROPOFOL 1000 MG/100ML IV EMUL
INTRAVENOUS | Status: AC
  Administered 2017-02-18: 20 ug/kg/min via INTRAVENOUS
  Filled 2017-02-18: qty 100

## 2017-02-18 MED ORDER — OSELTAMIVIR PHOSPHATE 6 MG/ML PO SUSR
75.0000 mg | Freq: Two times a day (BID) | ORAL | Status: AC
  Administered 2017-02-18 – 2017-02-21 (×7): 75 mg
  Filled 2017-02-18 (×7): qty 12.5

## 2017-02-18 MED ORDER — ORAL CARE MOUTH RINSE
15.0000 mL | Freq: Four times a day (QID) | OROMUCOSAL | Status: DC
  Administered 2017-02-19 – 2017-03-22 (×112): 15 mL via OROMUCOSAL

## 2017-02-18 MED ORDER — FENTANYL BOLUS VIA INFUSION
50.0000 ug | INTRAVENOUS | Status: DC | PRN
  Administered 2017-02-18: 25 ug via INTRAVENOUS
  Administered 2017-02-18 – 2017-02-22 (×2): 50 ug via INTRAVENOUS
  Filled 2017-02-18: qty 50

## 2017-02-18 MED ORDER — ROCURONIUM BROMIDE 50 MG/5ML IV SOLN
100.0000 mg | Freq: Once | INTRAVENOUS | Status: AC
  Administered 2017-02-18: 100 mg via INTRAVENOUS
  Filled 2017-02-18: qty 10

## 2017-02-18 MED ORDER — FENTANYL CITRATE (PF) 100 MCG/2ML IJ SOLN
100.0000 ug | Freq: Once | INTRAMUSCULAR | Status: AC
  Administered 2017-02-18: 100 ug via INTRAVENOUS

## 2017-02-18 MED ORDER — MIDAZOLAM HCL 2 MG/2ML IJ SOLN
INTRAMUSCULAR | Status: AC
  Administered 2017-02-18: 2 mg via INTRAVENOUS
  Filled 2017-02-18: qty 2

## 2017-02-18 NOTE — Progress Notes (Signed)
ANTICOAGULATION CONSULT NOTE - Follow Up Consult  Pharmacy Consult for Heparin Indication: atrial fibrillation  Allergies  Allergen Reactions  . Penicillins Swelling    Age 62 or 12 - pt states he had swelling all over and his lips got real big    Patient Measurements: Height: 5\' 11"  (180.3 cm) Weight: (!) 382 lb 8 oz (173.5 kg) IBW/kg (Calculated) : 75.3 Heparin Dosing Weight: 115 kg  Vital Signs: Temp: 102.4 F (39.1 C) (02/09 1226) Temp Source: Oral (02/09 1226) BP: 76/63 (02/09 1330) Pulse Rate: 94 (02/09 1345)  Labs: Recent Labs    02/16/17 1710 02/16/17 2112  02/17/17 0612 02/17/17 0912  02/17/17 1956 02/17/17 2100 02/18/17 0519 02/18/17 1221  HGB 14.4  --   --  14.1  --   --   --   --  15.3  --   HCT 44.8  --   --  43.4  --   --   --   --  47.5  --   PLT 91*  --   --  94*  --   --   --   --  85*  --   LABPROT 14.6  --   --   --   --   --   --   --   --   --   INR 1.14  --   --   --   --   --   --   --   --   --   HEPARINUNFRC  --   --    < >  --   --    < >  --  1.09* 0.97* 0.92*  CREATININE 1.13  --   --  1.00  --   --  0.97  --  0.94  --   TROPONINI 0.08* 0.05*  --  0.05* 0.06*  --   --   --   --   --    < > = values in this interval not displayed.   Assessment: 62 year old male with recent pneumonia, brought from home by EMS for SOB and found to be in new atrial fibrillation. .  Heparin level: 0.92 remains high despite multiple rate decreases. Labs collected from Arterial line and heparin infusing in opposite arm.  No issues per RN with infusion, no bleeding. Platelets low on admit, remain low  Goal of Therapy:  Heparin level 0.3-0.7 units/ml Monitor platelets by anticoagulation protocol: Yes   Plan:  -Reduce heparin to 1000 units/hr -Heparin level in 8 hours -Daily Heparin level, CBC -Monitor for s/sx of bleeding   Jodean Lima Pressley Tadesse  02/18/2017 3:08 PM

## 2017-02-18 NOTE — Progress Notes (Signed)
Pt temp 102.4 F orally. RN notified.

## 2017-02-18 NOTE — Progress Notes (Signed)
Initial Nutrition Assessment  DOCUMENTATION CODES:  Morbid obesity  INTERVENTION:  Once order for TF placed, start Vital HIgh Protein at goal rate of 75cc/hr (1800 ml per day)  to provide 1800 kcals, 158 gm protein, 1505 ml free water daily.  NUTRITION DIAGNOSIS:  Inadequate oral intake related to inability to eat as evidenced by NPO status.  GOAL:  Provide needs based on ASPEN/SCCM guidelines  MONITOR:  Diet advancement, Vent status, Labs, Weight trends, I & O's  REASON FOR ASSESSMENT:  Ventilator    ASSESSMENT:  62 y/o male PMHx NHL (dx 2012 in remission), HTN/HLD, Gout, Morbid obesity, chronic LE edema/ foot wounds. Presents w/ swelling of BLE and abdomen over several weeks and more acute development of chills, sob, weakness, pallor, hemoptysis, nausea, HA. Presented to ED via EMS. Admitted for acute resp failure, new onset Afib. Found to have Influenza A/CAP. Developed acute resp distress 2/9 requiring intubation.   Pt intubated, but is still able to interact. Family is also at bedside. They report the patient was eating well PTA, however he has not eaten much at all since he has been admitted.  At home, the patient took a MVI and Vit D supplement. Has not had trouble with appetite. Had brief episodes of diarrhea PTA, but was related to trying [place] reports patients UBW is 350. Per chart, Patient had presented 2/8 at 378 lbs. He appears to have gradual weight gain over the past few years, Was ~360 lbs 10 months ago.   Per MD note, Tube feeding was going to be started, however, no order was actually placed. Will leave recommendations to be started once order placed.   Patient is currently intubated on ventilator support MV: 16.6 L/min Temp (24hrs), Avg:99.4 F (37.4 C), Min:96.7 F (35.9 C), Max:102.4 F (39.1 C) Propofol: Off.  Labs: TG:149, Phos: 2.2, Glu: 125-150, A1C: 6.3, Sedated on fentanyl/precedex/propofol. Pressor support: Levophed. Map 78, BP:85/71 Meds:  Lasix, Tamiflu, PPI, KCL,   Recent Labs  Lab 02/17/17 0612 02/17/17 1956 02/18/17 0519  NA 131* 130* 132*  K 3.9 4.3 4.1  CL 96* 96* 95*  CO2 22 22 25   BUN 27* 27* 25*  CREATININE 1.00 0.97 0.94  CALCIUM 7.6* 7.5* 7.6*  MG 1.9 1.6* 1.7  PHOS  --   --  2.2*  GLUCOSE 138* 152* 138*    NUTRITION - FOCUSED PHYSICAL EXAM:   Most Recent Value  Orbital Region  No depletion  Upper Arm Region  No depletion  Thoracic and Lumbar Region  No depletion  Buccal Region  No depletion  Temple Region  No depletion  Clavicle Bone Region  No depletion  Clavicle and Acromion Bone Region  No depletion  Scapular Bone Region  No depletion  Dorsal Hand  No depletion  Patellar Region  No depletion  Anterior Thigh Region  No depletion  Posterior Calf Region  No depletion  Edema (RD Assessment)  None     Diet Order:  Diet NPO time specified Except for: Sips with Meds  EDUCATION NEEDS:  No education needs have been identified at this time  Skin: MSAD to groin/perineum. Abrasion to L leg, Venous stasis Ulcer to R ankle, L toe  Last BM:  Unknown  Height:  Ht Readings from Last 1 Encounters:  02/17/17 5\' 11"  (1.803 m)   Weight:  Wt Readings from Last 1 Encounters:  02/18/17 (!) 382 lb 8 oz (173.5 kg)   Wt Readings from Last 10 Encounters:  02/18/17 [name] Kitchen)  382 lb 8 oz (173.5 kg)  11/07/16 36 lb 6.4 oz (16.5 kg)  05/02/16 (!) 360 lb 1.6 oz (163.3 kg)  11/02/15 (!) 367 lb 14.4 oz (166.9 kg)  05/04/15 (!) 362 lb 9.6 oz (164.5 kg)  10/27/14 (!) 357 lb 1.6 oz (162 kg)  04/21/14 (!) 346 lb 1.6 oz (157 kg)  11/04/13 (!) 339 lb (153.8 kg)  10/21/13 (!) 339 lb 9.6 oz (154 kg)  09/23/13 (!) 344 lb 3.2 oz (156.1 kg)   Ideal Body Weight:  78.18 kg  BMI:  Body mass index is 53.35 kg/m.  Estimated Nutritional Needs:  Kcal:  1720-1950 kcals (22-25 kcal/kg ibw) Protein:  156-195g Pro (2-2.5 g/kg ibw) Fluid:  Per MD goals  Burtis Junes RD, LDN, CNSC Clinical Nutrition Pager: [phone] 02/18/2017  6:44 PM

## 2017-02-18 NOTE — Progress Notes (Signed)
Pts cardiac monitor alarmed with a run of non-sustained vtach consisting of 19 beats. Hayden Pedro, NP informed. EKG strip printed. Verbal orders for basic labs received from Hayden Pedro, NP.

## 2017-02-18 NOTE — Procedures (Signed)
Bronchoscopy Procedure Note DEUNTA BENEKE [id number] 08-30-1955  Procedure: Bronchoscopy Indications: Diagnostic evaluation of the airways and Obtain specimens for culture and/or other diagnostic studies  Procedure Details Consent: Risks of procedure as well as the alternatives and risks of each were explained to the (patient/caregiver).  Consent for procedure obtained. Time Out: Verified patient identification, verified procedure, site/side was marked, verified correct patient position, special equipment/implants available, medications/allergies/relevent history reviewed, required imaging and test results available.  Performed  In preparation for procedure, patient was given 100% FiO2 and bronchoscope lubricated. Sedation: Benzodiazepines, Muscle relaxants, Etomidate and Fentanyl  Airway entered and the following bronchi were examined: RUL, RML, RLL, LUL and LLL.   Purulent sputum diffusely Bronchoscope removed.  , Patient placed back on 100% FiO2 at conclusion of procedure.    Evaluation Hemodynamic Status: BP stable throughout; O2 sats: stable throughout Patient's Current Condition: stable Specimens:  Sent purulent fluid Complications: No apparent complications Patient did tolerate procedure well.   [name] 02/18/2017

## 2017-02-18 NOTE — Progress Notes (Signed)
PULMONARY / CRITICAL CARE MEDICINE   Name: [name] MRN: [id number] DOB: 1955/05/27    ADMISSION DATE:  02/16/2017 CONSULTATION DATE:  02/16/2017  REFERRING MD:  Regenia Skeeter  CHIEF COMPLAINT:  DYspnea  HISTORY OF PRESENT ILLNESS:   62y/o male with a history of lymphoma in remission, last treated in 2012 who was admitted with acute hypoxemic respiratory failure in the setting of influenza and likely pulmonary edema.     SUBJECTIVE:  Acute respiratory distress requiring urgent intubation.   VITAL SIGNS: BP (!) 106/91 (BP Location: Left Arm)   Pulse 93   Temp 98.7 F (37.1 C) (Oral)   Resp (!) 32   Ht _0  (1.803 m)   Wt (!) 173.5 kg (382 lb 8 oz)   SpO2 91%   BMI 53.35 kg/m   HEMODYNAMICS:    VENTILATOR SETTINGS: FiO2 (%):  [70 %-80 %] 70 %  INTAKE / OUTPUT: I/O last 3 completed shifts: In: 3470.3 [I.V.:970.3; Other:100; IV Piggyback:2400] Out: 5621 [Urine:4375]  PHYSICAL EXAMINATION:  General: Morbidly obese male in acute respiratory distress and intubated urgently. HEENT: Endotracheal tube in place, orogastric tube in place to suction.  Short neck.  Multiple crowns and dental repair is noted PSY: Sedated Neuro: Intact prior to intubation CV: Heart sounds are distant regular rate and rhythm frequent PVCs PULM: Coarse rhonchi bilaterally, poor excursion prior to intubation.  Noted to have drainage from the esophagus and trachea during intubation.  Epiglottis and trachea were noted to be edematous and erythemic. GI: Large, obese, decreased bowel sounds Extremities: Lower extremities with chronic venous stasis massive anasarca and old healed ulcers. Skin: no rashes or lesions    LABS:  BMET Recent Labs  Lab 02/17/17 0612 02/17/17 1956 02/18/17 0519  NA 131* 130* 132*  K 3.9 4.3 4.1  CL 96* 96* 95*  CO2 _1 BUN 27* 27* 25*  CREATININE 1.00 0.97 0.94  GLUCOSE 138* 152* 138*    Electrolytes Recent Labs  Lab 02/17/17 0612 02/17/17 1956  02/18/17 0519  CALCIUM 7.6* 7.5* 7.6*  MG 1.9 1.6* 1.7  PHOS  --   --  2.2*    CBC Recent Labs  Lab 02/16/17 1710 02/17/17 0612 02/18/17 0519  WBC 2.9* 3.6* 6.5  HGB 14.4 14.1 15.3  HCT 44.8 43.4 47.5  PLT 91* 94* 85*    Coag's Recent Labs  Lab 02/16/17 1710  INR 1.14    Sepsis Markers Recent Labs  Lab 02/16/17 1944 02/16/17 2112 02/16/17 2300 02/17/17 0015 02/17/17 0612 02/18/17 0519  LATICACIDVEN 2.00*  --  1.8 1.7  --   --   PROCALCITON  --  0.33  --   --  0.37 0.39    ABG Recent Labs  Lab 02/16/17 1634 02/17/17 2158  PHART 7.448 7.351  PCO2ART 30.6* 44.4  PO2ART 59.0* 62.0*    Liver Enzymes Recent Labs  Lab 02/16/17 1710 02/17/17 0612  AST 115* 100*  ALT 66* 57  ALKPHOS 185* 158*  BILITOT 1.0 0.9  ALBUMIN 3.0* 2.6*    Cardiac Enzymes Recent Labs  Lab 02/16/17 2112 02/17/17 0612 02/17/17 0912  TROPONINI 0.05* 0.05* 0.06*    Glucose Recent Labs  Lab 02/17/17 1135 02/17/17 1537 02/17/17 1953 02/18/17 0007 02/18/17 0340 02/18/17 0751  GLUCAP 185* 204* 155* 147* 130* 125*    Imaging No results found.    STUDIES:  2/8 echo >    CULTURES: 2/7 BCx 2 >> 2/7 RVP >> influenza A 2/7 Sputum  cx >>  ANTIBIOTICS: Levaquin 2/3 >2/5; 2/7 x 1 Vancomycin 2/7 >> Azactam 2/7 >>  Tamiflu 2/8 >   SIGNIFICANT EVENTS: 2/7 Admit 02/18/2017 intubated for acute respiratory failure  LINES/TUBES: Left chest port >> accessed 2/7 02/18/2017 orotracheal tube>>  DISCUSSION: 62 y/o male with acute hypoxemic respiratory failure due to severe CAP, may have diastolic heart failure. Acquired intubation 02/18/2017 for acute on chronic respiratory failure with failed attempt at BiPAP.  ASSESSMENT / PLAN:  PULMONARY A: Acute respiratory failure with hypoxemia Acute pulmonary edema Influeanza A Vent dependent respiratory failure as of 02/18/2017 P:   Intubated 02/18/2017 Vent bundle Plan fiberoptic bronchoscopy 02/18/2017 for  BAL  CARDIOVASCULAR A:  Heart failure? proBNP elevated, clinical history supportive Received 3.5 L on 2/8 for "sepsis" P:  Diuresis as tolerated Monitor blood pressures closely  RENAL Lab Results  Component Value Date   CREATININE 0.94 02/18/2017   CREATININE 0.97 02/17/2017   CREATININE 1.00 02/17/2017   CREATININE 0.8 10/24/2016   CREATININE 0.9 05/02/2016   CREATININE 0.9 11/02/2015   Recent Labs  Lab 02/17/17 0612 02/17/17 1956 02/18/17 0519  K 3.9 4.3 4.1    A:   No acute issues P:   Monitor BMET and UOP Replace electrolytes as needed   GASTROINTESTINAL A:   No acute issues P:   Start tube feeds now that he is intubated  HEMATOLOGIC Recent Labs    02/17/17 0612 02/18/17 0519  HGB 14.1 15.3   Lab Results  Component Value Date   INR 1.14 02/16/2017   INR 0.87 02/18/2010   INR 0.90 02/01/2010      A:   Mild thrombocytopenia History of non-hodgkins lymphoma P:  Monitor for bleeding  INFECTIOUS A:   Severe CAP Influenza Bilateral foot wounds P:   Continue antibiotics, consider stopping on 2/9 if resp cultures negative tamiflu Wound team consult  ENDOCRINE . CBG (last 3)  Recent Labs    02/18/17 0007 02/18/17 0340 02/18/17 0751  GLUCAP 147* 130* 125*   A:   DM2   P:   SSI  NEUROLOGIC A:   No acute issues 02/18/2017 intubated therefore will require sedation for tube tolerance P:   Minimize sedating meds Tube tolerance medication  FAMILY  - Updates: Wife updated at bedside explained the need for intubation. - Inter-disciplinary family meet or Palliative Care meeting due by:  day 7  App cct 45 min  Richardson Landry Minor ACNP Maryanna Shape PCCM Pager [phone] till 1 pm If no answer page [phone] 02/18/2017, 10:35 AM  Attending Note:  62 year old male with Flu A presenting with ARDS and respiratory failure.  Patient was placed on BiPAP overnight and is now having extremely high WOB on exam.  I reviewed CXR myself, diffuse  infiltrate noted.  Will proceed with intubation and bronchoscopy today.  Active diureses.  Sedation as ordered.  Place on high PEEP post intubation given hypoxemic respiratory failure.  Adjust vent for ABG.  Begin TF.  Keep sedated and dry.  The patient is critically ill with multiple organ systems failure and requires high complexity decision making for assessment and support, frequent evaluation and titration of therapies, application of advanced monitoring technologies and extensive interpretation of multiple databases.   Critical Care Time devoted to patient care services described in this note is  35  Minutes. This time reflects time of care of this signee Dr [name]. This critical care time does not reflect procedure time, or teaching time or supervisory time  of PA/NP/Med student/Med Resident etc but could involve care discussion time.  Rush Farmer, M.D. [facility] Pulmonary/Critical Care Medicine. Pager: [phone]. After hours pager: [phone].

## 2017-02-18 NOTE — Progress Notes (Signed)
[place] for Heparin Indication: atrial fibrillation  Allergies  Allergen Reactions  . Penicillins Swelling    Age 62 or 37 - pt states he had swelling all over and his lips got real big    Patient Measurements: Height: 5\' 11"  (180.3 cm) Weight: (!) 382 lb 8 oz (173.5 kg) IBW/kg (Calculated) : 75.3 Heparin Dosing Weight: 115 kg  Vital Signs: Temp: 99.6 F (37.6 C) (02/09 2008) Temp Source: Oral (02/09 2008) BP: 136/84 (02/09 2010) Pulse Rate: 74 (02/09 2010)  Labs: Recent Labs    02/16/17 1710 02/16/17 2112  02/17/17 0612 02/17/17 0912  02/17/17 1956  02/18/17 0519 02/18/17 1221 02/18/17 2133  HGB 14.4  --   --  14.1  --   --   --   --  15.3  --  16.3  HCT 44.8  --   --  43.4  --   --   --   --  47.5  --  50.8  PLT 91*  --   --  94*  --   --   --   --  85*  --  93*  LABPROT 14.6  --   --   --   --   --   --   --   --   --   --   INR 1.14  --   --   --   --   --   --   --   --   --   --   HEPARINUNFRC  --   --    < >  --   --    < >  --    < > 0.97* 0.92* 0.70  CREATININE 1.13  --   --  1.00  --   --  0.97  --  0.94  --  1.11  TROPONINI 0.08* 0.05*  --  0.05* 0.06*  --   --   --   --   --   --    < > = values in this interval not displayed.    Assessment: 62 year old male with recent pneumonia, brought from home by EMS for SOB and found to be in new atrial fibrillation.  Heparin is therapeutic but on the very high end. I will leave the rate as is, as it does not seem that high for his body habitus. hgb wnl, plts slightly low.  Goal of Therapy:  Heparin level 0.3-0.7 units/ml Monitor platelets by anticoagulation protocol: Yes    Plan:  -Continue heparin at 1000 units/hr -Daily Heparin level, CBC   Harvel Quale  02/18/2017 10:48 PM

## 2017-02-18 NOTE — Progress Notes (Signed)
Pt reported nausea and tachypnea. EKG done and hand delivered to Dr. Nelda Marseille.

## 2017-02-18 NOTE — Progress Notes (Signed)
ANTICOAGULATION CONSULT NOTE - Follow Up Consult  Pharmacy Consult for Heparin Indication: atrial fibrillation  Allergies  Allergen Reactions  . Penicillins Swelling    Age 62 or 25 - pt states he had swelling all over and his lips got real big    Patient Measurements: Height: 5\' 11"  (180.3 cm) Weight: (!) 382 lb 8 oz (173.5 kg) IBW/kg (Calculated) : 75.3 Heparin Dosing Weight: 115 kg  Vital Signs: Temp: 96.7 F (35.9 C) (02/09 0340) Temp Source: Axillary (02/09 0340) BP: 113/84 (02/09 0400) Pulse Rate: 89 (02/09 0600)  Labs: Recent Labs    02/16/17 1710 02/16/17 2112  02/17/17 0612 02/17/17 0912 02/17/17 1232 02/17/17 1956 02/17/17 2100 02/18/17 0519  HGB 14.4  --   --  14.1  --   --   --   --  15.3  HCT 44.8  --   --  43.4  --   --   --   --  47.5  PLT 91*  --   --  94*  --   --   --   --  85*  LABPROT 14.6  --   --   --   --   --   --   --   --   INR 1.14  --   --   --   --   --   --   --   --   HEPARINUNFRC  --   --    < >  --   --  0.86*  --  1.09* 0.97*  CREATININE 1.13  --   --  1.00  --   --  0.97  --  0.94  TROPONINI 0.08* 0.05*  --  0.05* 0.06*  --   --   --   --    < > = values in this interval not displayed.    Assessment: 62 year old male with recent pneumonia, brought from home by EMS for SOB and found to be in new atrial fibrillation. .  Heparin level remains high despite multiple rate decreases.  No issues per RN with infusion, no bleeding. Plts slightly down today.   Goal of Therapy:  Heparin level 0.3-0.7 units/ml Monitor platelets by anticoagulation protocol: Yes   Plan:  -Reduce heparin to 1200 units/hr -Check another level this afternoon -Daily HL, CBC   Harvel Quale  02/18/2017 6:38 AM

## 2017-02-18 NOTE — Progress Notes (Signed)
0954: Pt's cardiac monitor alarmed and displayed a nonsustained run of v-tach consisting of four beats of vtach. EKG strip saved, printed and hand delivered to Dr. Nelda Marseille who verbally acknowledged the EKG rhythm strip.

## 2017-02-18 NOTE — Procedures (Signed)
Intubation Procedure Note [name] [id number] 1955-09-24  Procedure: Intubation Indications: Airway protection and maintenance  Procedure Details Consent: Risks of procedure as well as the alternatives and risks of each were explained to the (patient/caregiver).  Consent for procedure obtained. Time Out: Verified patient identification, verified procedure, site/side was marked, verified correct patient position, special equipment/implants available, medications/allergies/relevent history reviewed, required imaging and test results available.  Performed  Maximum sterile technique was used including gloves, hand hygiene and mask.  MAC    Evaluation Hemodynamic Status: BP stable throughout; O2 sats: stable throughout Patient's Current Condition: stable Complications: No apparent complications Patient did tolerate procedure well. Chest X-ray ordered to verify placement.  CXR: pending.   [name] 02/18/2017

## 2017-02-18 NOTE — Procedures (Signed)
OGT Placement By MD  OGT placement by MD under direct laryngoscopy and verified by auscultation  Rush Farmer, M.D. [facility] Pulmonary/Critical Care Medicine. Pager: [phone]. After hours pager: [phone].

## 2017-02-19 ENCOUNTER — Inpatient Hospital Stay (HOSPITAL_COMMUNITY): Payer: BLUE CROSS/BLUE SHIELD

## 2017-02-19 DIAGNOSIS — R0902 Hypoxemia: Secondary | ICD-10-CM

## 2017-02-19 DIAGNOSIS — J09X2 Influenza due to identified novel influenza A virus with other respiratory manifestations: Secondary | ICD-10-CM

## 2017-02-19 LAB — GLUCOSE, CAPILLARY
GLUCOSE-CAPILLARY: 126 mg/dL — AB (ref 65–99)
GLUCOSE-CAPILLARY: 129 mg/dL — AB (ref 65–99)
GLUCOSE-CAPILLARY: 133 mg/dL — AB (ref 65–99)
GLUCOSE-CAPILLARY: 147 mg/dL — AB (ref 65–99)
GLUCOSE-CAPILLARY: 153 mg/dL — AB (ref 65–99)
Glucose-Capillary: 145 mg/dL — ABNORMAL HIGH (ref 65–99)

## 2017-02-19 LAB — CBC WITH DIFFERENTIAL/PLATELET
Basophils Absolute: 0 10*3/uL (ref 0.0–0.1)
Basophils Relative: 0 %
EOS PCT: 0 %
Eosinophils Absolute: 0 10*3/uL (ref 0.0–0.7)
HEMATOCRIT: 51.7 % (ref 39.0–52.0)
HEMOGLOBIN: 16.5 g/dL (ref 13.0–17.0)
Lymphocytes Relative: 15 %
Lymphs Abs: 1.3 10*3/uL (ref 0.7–4.0)
MCH: 30.6 pg (ref 26.0–34.0)
MCHC: 31.9 g/dL (ref 30.0–36.0)
MCV: 95.7 fL (ref 78.0–100.0)
MONO ABS: 0.5 10*3/uL (ref 0.1–1.0)
Monocytes Relative: 6 %
Neutro Abs: 6.9 10*3/uL (ref 1.7–7.7)
Neutrophils Relative %: 79 %
Platelets: 97 10*3/uL — ABNORMAL LOW (ref 150–400)
RBC: 5.4 MIL/uL (ref 4.22–5.81)
RDW: 17.2 % — ABNORMAL HIGH (ref 11.5–15.5)
WBC: 8.8 10*3/uL (ref 4.0–10.5)

## 2017-02-19 LAB — POCT I-STAT 3, ART BLOOD GAS (G3+)
Bicarbonate: 26.6 mmol/L (ref 20.0–28.0)
O2 SAT: 91 %
PCO2 ART: 47.6 mmHg (ref 32.0–48.0)
Patient temperature: 98.8
TCO2: 28 mmol/L (ref 22–32)
pH, Arterial: 7.355 (ref 7.350–7.450)
pO2, Arterial: 66 mmHg — ABNORMAL LOW (ref 83.0–108.0)

## 2017-02-19 LAB — CULTURE, RESPIRATORY W GRAM STAIN: Culture: NORMAL

## 2017-02-19 LAB — BLOOD GAS, ARTERIAL
ACID-BASE EXCESS: 9.1 mmol/L — AB (ref 0.0–2.0)
BICARBONATE: 32.9 mmol/L — AB (ref 20.0–28.0)
Drawn by: 252031
FIO2: 60
MECHVT: 600 mL
O2 Saturation: 91 %
PCO2 ART: 44.4 mmHg (ref 32.0–48.0)
PEEP: 12 cmH2O
PH ART: 7.485 — AB (ref 7.350–7.450)
Patient temperature: 99.3
RATE: 28 resp/min
pO2, Arterial: 60.4 mmHg — ABNORMAL LOW (ref 83.0–108.0)

## 2017-02-19 LAB — MAGNESIUM: MAGNESIUM: 1.9 mg/dL (ref 1.7–2.4)

## 2017-02-19 LAB — BASIC METABOLIC PANEL
Anion gap: 13 (ref 5–15)
BUN: 28 mg/dL — ABNORMAL HIGH (ref 6–20)
CALCIUM: 7.5 mg/dL — AB (ref 8.9–10.3)
CO2: 25 mmol/L (ref 22–32)
CREATININE: 0.96 mg/dL (ref 0.61–1.24)
Chloride: 96 mmol/L — ABNORMAL LOW (ref 101–111)
GFR calc non Af Amer: >60 mL/min (ref >60)
Glucose, Bld: 158 mg/dL — ABNORMAL HIGH (ref 65–99)
Potassium: 3.9 mmol/L (ref 3.5–5.1)
SODIUM: 134 mmol/L — AB (ref 135–145)

## 2017-02-19 LAB — CULTURE, RESPIRATORY

## 2017-02-19 LAB — VANCOMYCIN, TROUGH: Vancomycin Tr: 14 ug/mL — ABNORMAL LOW (ref 15–20)

## 2017-02-19 LAB — HEPARIN LEVEL (UNFRACTIONATED): Heparin Unfractionated: 0.38 IU/mL (ref 0.30–0.70)

## 2017-02-19 LAB — PHOSPHORUS: PHOSPHORUS: 1.8 mg/dL — AB (ref 2.5–4.6)

## 2017-02-19 MED ORDER — SODIUM GLYCEROPHOSPHATE 1 MMOLE/ML IV SOLN
20.0000 mmol | Freq: Once | INTRAVENOUS | Status: AC
  Administered 2017-02-19: 20 mmol via INTRAVENOUS
  Filled 2017-02-19: qty 20

## 2017-02-19 MED ORDER — VITAL HIGH PROTEIN PO LIQD
1000.0000 mL | ORAL | Status: DC
  Administered 2017-02-19: 1000 mL

## 2017-02-19 MED ORDER — POTASSIUM CHLORIDE 20 MEQ/15ML (10%) PO SOLN
40.0000 meq | Freq: Three times a day (TID) | ORAL | Status: AC
  Administered 2017-02-19 (×2): 40 meq
  Filled 2017-02-19 (×2): qty 30

## 2017-02-19 MED ORDER — VANCOMYCIN HCL 10 G IV SOLR
1500.0000 mg | Freq: Two times a day (BID) | INTRAVENOUS | Status: DC
  Administered 2017-02-19 – 2017-02-21 (×5): 1500 mg via INTRAVENOUS
  Filled 2017-02-19 (×5): qty 1500

## 2017-02-19 MED ORDER — FUROSEMIDE 10 MG/ML IJ SOLN: 40.0000 mg | Freq: Four times a day (QID) | INTRAMUSCULAR | Status: DC

## 2017-02-19 MED ORDER — FUROSEMIDE 10 MG/ML IJ SOLN
40.0000 mg | Freq: Four times a day (QID) | INTRAMUSCULAR | Status: AC
  Administered 2017-02-19 (×3): 40 mg via INTRAVENOUS
  Filled 2017-02-19 (×4): qty 4

## 2017-02-19 NOTE — Progress Notes (Signed)
Nutrition Brief Note  Received page with request to place tube feed orders. Noted consult from MD for initiation/management of tube feeds.  Access: 16 Fr. OGT placed 2/9; per chest x-ray terminates in stomach  Patient was assessed by RD on 2/9.   Ordered adult tube feeding protocol. Placed orders for Vital High Protein at 75 mL/hr (1800 mL goal daily volume) via OGT. Provides 1800 kcal, 158 grams of protein, 1505 mL H2O daily.  Willey Blade, MS, [place], LDN Office: [phone] Pager: [phone] After Hours/Weekend Pager: [phone]

## 2017-02-19 NOTE — Progress Notes (Signed)
Dietitian paged regarding order for tube feedings.

## 2017-02-19 NOTE — Progress Notes (Signed)
PULMONARY / CRITICAL CARE MEDICINE   Name: CAVION FAIOLA MRN: [id number] DOB: 1955-09-15    ADMISSION DATE:  02/16/2017 CONSULTATION DATE:  02/16/2017  REFERRING MD:  Regenia Skeeter  CHIEF COMPLAINT:  DYspnea  HISTORY OF PRESENT ILLNESS:   62y/o male with a history of lymphoma in remission, last treated in 2012 who was admitted with acute hypoxemic respiratory failure in the setting of influenza and likely pulmonary edema.     SUBJECTIVE:  Sedated on ventilator but follows commands.  Still requiring high FiO2 and PEEP to maintain adequate oxygenation.   VITAL SIGNS: BP (!) 126/99   Pulse 74   Temp 98.8 F (37.1 C) (Oral)   Resp 15   Ht _0  (1.803 m)   Wt (!) 175.3 kg (386 lb 7.5 oz)   SpO2 97%   BMI 53.90 kg/m   HEMODYNAMICS:    VENTILATOR SETTINGS: Vent Mode: PRVC FiO2 (%):  [60 %-100 %] 60 % Set Rate:  [20 bmp-28 bmp] 28 bmp Vt Set:  [600 mL] 600 mL PEEP:  [10 cmH20-16 cmH20] 14 cmH20 Plateau Pressure:  [28 cmH20-35 cmH20] 30 cmH20  INTAKE / OUTPUT: I/O last 3 completed shifts: In: 2832.2 [I.V.:1852.2; Other:90; NG/GT:90; IV Piggyback:800] Out: 4200 [Urine:4200]  PHYSICAL EXAMINATION:  General: Morbidly obese male on ventilator but awake and arousable HEENT: Endotracheal tube to ventilator, orogastric tube in place PSY: Normal effect  neuro: Follows commands moves all extremities CV: Sounds are regular PULM: Decreased air movement throughout VO:JJKK, non-tender, bsx4 active  Extremities: Lower extremity venous stasis with massive edema areas of ulcers along with cool to touch and have to Doppler pedal pulse Skin: no rashes or lesions     LABS:  BMET Recent Labs  Lab 02/18/17 0519 02/18/17 2133 02/19/17 0500  NA 132* 133* 134*  K 4.1 4.7 3.9  CL 95* 99* 96*  CO2 _1 BUN 25* 31* 28*  CREATININE 0.94 1.11 0.96  GLUCOSE 138* 168* 158*    Electrolytes Recent Labs  Lab 02/18/17 0519 02/18/17 2133 02/19/17 0500  CALCIUM 7.6* 7.4* 7.5*   MG 1.7 1.9 1.9  PHOS 2.2* 2.1* 1.8*    CBC Recent Labs  Lab 02/18/17 0519 02/18/17 2133 02/19/17 0500  WBC 6.5 8.7 8.8  HGB 15.3 16.3 16.5  HCT 47.5 50.8 51.7  PLT 85* 93* 97*    Coag's Recent Labs  Lab 02/16/17 1710  INR 1.14    Sepsis Markers Recent Labs  Lab 02/16/17 1944 02/16/17 2112 02/16/17 2300 02/17/17 0015 02/17/17 0612 02/18/17 0519  LATICACIDVEN 2.00*  --  1.8 1.7  --   --   PROCALCITON  --  0.33  --   --  0.37 0.39    ABG Recent Labs  Lab 02/18/17 1213 02/18/17 1620 02/19/17 0323  PHART 7.234* 7.347* 7.355  PCO2ART 66.4* 49.0* 47.6  PO2ART 57.0* 86.0 66.0*    Liver Enzymes Recent Labs  Lab 02/16/17 1710 02/17/17 0612  AST 115* 100*  ALT 66* 57  ALKPHOS 185* 158*  BILITOT 1.0 0.9  ALBUMIN 3.0* 2.6*    Cardiac Enzymes Recent Labs  Lab 02/16/17 2112 02/17/17 0612 02/17/17 0912  TROPONINI 0.05* 0.05* 0.06*    Glucose Recent Labs  Lab 02/18/17 1227 02/18/17 1652 02/18/17 2006 02/19/17 0009 02/19/17 0405 02/19/17 0816  GLUCAP 152* 153* 151* 133* 145* 147*    Imaging Dg Chest Port 1 View  Result Date: 02/19/2017 CLINICAL DATA:  Intubated EXAM: PORTABLE CHEST 1 VIEW COMPARISON:  02/18/2017  FINDINGS: Support devices are stable. Severe bilateral airspace disease diffusely, unchanged. Cardiomegaly. Possible small bilateral effusions. IMPRESSION: No significant change in the severe diffuse bilateral airspace disease. Suspect small layering effusions. Electronically Signed   By: Rolm Baptise M.D.   On: 02/19/2017 07:41   Dg Chest Port 1 View  Result Date: 02/18/2017 CLINICAL DATA:  Difficult intubation EXAM: PORTABLE CHEST 1 VIEW COMPARISON:  02/16/2017 FINDINGS: Endotracheal tube is 5.5 cm above the carina. Left Port-A-Cath remains in place, unchanged. NG tube is in the stomach. Bilateral mid and lower lung airspace opacities, slightly increased since prior study. Heart is borderline in size. No visible effusions. IMPRESSION:  Bilateral mid and lower lung airspace opacities compatible with edema or infection, worsening since prior study. Endotracheal tube 5.5 cm above the carina. Electronically Signed   By: Rolm Baptise M.D.   On: 02/18/2017 11:41      STUDIES:  2/8 echo > EF 25-30% moderately dilated left ventricle, diffuse hypokinesis mild mitral valve regurgitation, left atrium mildly dilated  CULTURES: 2/7 BCx 2 >> 2/7 RVP >> influenza A 2/7 Sputum cx >> 02/18/2017 BAL>>  ANTIBIOTICS: Levaquin 2/3 >2/5; 2/7 x 1 Vancomycin 2/7 >> Azactam 2/7 >>  Tamiflu 2/8 >   SIGNIFICANT EVENTS: 2/7 Admit 02/18/2017 intubated for acute respiratory failure  LINES/TUBES: Left chest port >> accessed 2/7 02/18/2017 orotracheal tube>> 02/18/2017 orogastric tube>> DISCUSSION: 62 y/o male with acute hypoxemic respiratory failure due to severe CAP, may have diastolic heart failure. Required  intubation 02/18/2017 for acute on chronic respiratory failure with failed attempt at BiPAP.  ASSESSMENT / PLAN:  PULMONARY A: Acute respiratory failure with hypoxemia Acute pulmonary edema Influeanza A Vent dependent respiratory failure as of 02/18/2017 P:   Intubated 02/18/2017 Vent bundle  fiberoptic bronchoscopy 02/18/2017 for BAL cultures pending  CARDIOVASCULAR A:  Heart failure? proBNP elevated, clinical history supportive Received 3.5 L on 2/8 for "sepsis" P:  Diuresis as tolerated Monitor blood pressures closely Pressors as needed  RENAL Lab Results  Component Value Date   CREATININE 0.96 02/19/2017   CREATININE 1.11 02/18/2017   CREATININE 0.94 02/18/2017   CREATININE 0.8 10/24/2016   CREATININE 0.9 05/02/2016   CREATININE 0.9 11/02/2015   Recent Labs  Lab 02/18/17 0519 02/18/17 2133 02/19/17 0500  K 4.1 4.7 3.9    Intake/Output Summary (Last 24 hours) at 02/19/2017 0940 Last data filed at 02/19/2017 0800 Gross per 24 hour  Intake 2174.21 ml  Output 3310 ml  Net -1135.79 ml    A:   No acute  issues P:   Monitor BMET and UOP Replace electrolytes as needed Repeat diuresis on 02/19/2017  GASTROINTESTINAL A:   No acute issues P:   Start tube feeds now that he is intubated  HEMATOLOGIC Recent Labs    02/18/17 2133 02/19/17 0500  HGB 16.3 16.5   Lab Results  Component Value Date   INR 1.14 02/16/2017   INR 0.87 02/18/2010   INR 0.90 02/01/2010      A:   Mild thrombocytopenia History of non-hodgkins lymphoma P:  Monitor for bleeding  INFECTIOUS A:   Severe CAP Influenza Bilateral foot wounds P:   Continue antibiotics, consider stopping if resp cultures negative tamiflu Wound team consult  ENDOCRINE . CBG (last 3)  Recent Labs    02/19/17 0009 02/19/17 0405 02/19/17 0816  GLUCAP 133* 145* 147*   A:   DM2   P:   SSI  NEUROLOGIC A:   No acute issues 02/18/2017 intubated therefore will  require sedation for tube tolerance P:   Minimize sedating meds Tube tolerance medication 02/19/2017 will sedated but arouses and follows commands.  FAMILY  - Updates: Wife updated at bedside explained the need for intubation. - Inter-disciplinary family meet or Palliative Care meeting due by:  day 7  App cct 40 min  Richardson Landry Minor ACNP Maryanna Shape PCCM Pager [phone] till 1 pm If no answer page [phone] 02/19/2017, 9:37 AM  Attending Note:  62 year old male with extensive PMH who presents with flu A and associated ARDS and respiratory failure.  Patient is improving from a fluid standpoint.  I reviewed CXR myself, ETT is in good position and aeration is stable.  Discussed with PCCM-NP.  Will continue lasix.  Sedation as needed.  Decrease PEEP to 12.  Family and patient updated bedside.  PCCM will continue to follow.  Hold off weaning today.  The patient is critically ill with multiple organ systems failure and requires high complexity decision making for assessment and support, frequent evaluation and titration of therapies, application of advanced  monitoring technologies and extensive interpretation of multiple databases.   Critical Care Time devoted to patient care services described in this note is  35  Minutes. This time reflects time of care of this signee Dr [name]. This critical care time does not reflect procedure time, or teaching time or supervisory time of PA/NP/Med student/Med Resident etc but could involve care discussion time.  Rush Farmer, M.D. [facility] Pulmonary/Critical Care Medicine. Pager: [phone]. After hours pager: [phone].

## 2017-02-19 NOTE — Progress Notes (Signed)
Pharmacy Antibiotic Note  [name] is a 62 y.o. male admitted on 02/16/2017 with pneumonia.  Pharmacy has been consulted for vancomycin and aztreonam dosing.  Vanc trough slightly below goal.  Plan: Change vancomycin to 1500mg  IV every 12 hours for calculated trough ~16.  Goal trough 15-20 mcg/mL.  Continue aztreonam without change.  Height: 5\' 11"  (180.3 cm) Weight: (!) 386 lb 7.5 oz (175.3 kg) IBW/kg (Calculated) : 75.3  Temp (24hrs), Avg:99.9 F (37.7 C), Min:98.7 F (37.1 C), Max:102.4 F (39.1 C)  Recent Labs  Lab 02/16/17 1710 02/16/17 1813 02/16/17 1944 02/16/17 2300 02/17/17 0015 02/17/17 0612 02/17/17 1956 02/18/17 0519 02/18/17 2133 02/19/17 0500  WBC 2.9*  --   --   --   --  3.6*  --  6.5 8.7  --   CREATININE 1.13  --   --   --   --  1.00 0.97 0.94 1.11  --   LATICACIDVEN  --  1.96* 2.00* 1.8 1.7  --   --   --   --   --   VANCOTROUGH  --   --   --   --   --   --   --   --   --  14*    Estimated Creatinine Clearance: 114 mL/min (by C-G formula based on SCr of 1.11 mg/dL).    Allergies  Allergen Reactions  . Penicillins Swelling    Age 69 or 49 - pt states he had swelling all over and his lips got real big    Antimicrobials this admission: Vanc 2/7 >> Aztreonam 2/7 >> Levaquin 2/7 x1 Tamiflu 2/7 >>  Dose adjustments this admission: Vanc 1250 Q12 > trough 14 > vanc 1500 Q12  Microbiology results: 2/7 Strep Pneumo - neg 2/7 Sputum >> 2/7 Blood >> 2/7 RVP- Flu A +  Thank you for allowing pharmacy to be a part of this patient's care.  Wynona Neat, PharmD, BCPS  02/19/2017 7:21 AM

## 2017-02-19 NOTE — Progress Notes (Signed)
[place] for Heparin Indication: atrial fibrillation  Allergies  Allergen Reactions  . Penicillins Swelling    Age 62 or 17 - pt states he had swelling all over and his lips got real big   Patient Measurements: Height: 5\' 11"  (180.3 cm) Weight: (!) 386 lb 7.5 oz (175.3 kg) IBW/kg (Calculated) : 75.3 Heparin Dosing Weight: 115 kg  Vital Signs: Temp: 99.8 F (37.7 C) (02/10 1210) Temp Source: Oral (02/10 1210) BP: 112/83 (02/10 1200) Pulse Rate: 79 (02/10 1200)  Labs: Recent Labs    02/16/17 1710 02/16/17 2112  02/17/17 0612 02/17/17 0912  02/18/17 0519 02/18/17 1221 02/18/17 2133 02/19/17 0500  HGB 14.4  --   --  14.1  --   --  15.3  --  16.3 16.5  HCT 44.8  --   --  43.4  --   --  47.5  --  50.8 51.7  PLT 91*  --   --  94*  --   --  85*  --  93* 97*  LABPROT 14.6  --   --   --   --   --   --   --   --   --   INR 1.14  --   --   --   --   --   --   --   --   --   HEPARINUNFRC  --   --    < >  --   --    < > 0.97* 0.92* 0.70 0.38  CREATININE 1.13  --   --  1.00  --    < > 0.94  --  1.11 0.96  TROPONINI 0.08* 0.05*  --  0.05* 0.06*  --   --   --   --   --    < > = values in this interval not displayed.   Assessment: 62 year old male with recent pneumonia, brought from home by EMS for SOB and found to be in new atrial fibrillation.  Heparin level therapeutic: 0.38 - no overt bleeding documented; CBC stable, PLT remain low  Goal of Therapy:  Heparin level 0.3-0.7 units/ml Monitor platelets by anticoagulation protocol: Yes   Plan:  -Continue heparin at 1000 units/hr -Daily Heparin level, CBC -Monitor s/sx of bleeding   Jodean Lima Janean Eischen  02/19/2017 1:23 PM

## 2017-02-20 ENCOUNTER — Inpatient Hospital Stay (HOSPITAL_COMMUNITY): Payer: BLUE CROSS/BLUE SHIELD

## 2017-02-20 LAB — BASIC METABOLIC PANEL
Anion gap: 10 (ref 5–15)
BUN: 24 mg/dL — AB (ref 6–20)
CHLORIDE: 99 mmol/L — AB (ref 101–111)
CO2: 28 mmol/L (ref 22–32)
Calcium: 7.5 mg/dL — ABNORMAL LOW (ref 8.9–10.3)
Creatinine, Ser: 0.82 mg/dL (ref 0.61–1.24)
GFR calc Af Amer: >60 mL/min (ref >60)
GFR calc non Af Amer: >60 mL/min (ref >60)
GLUCOSE: 161 mg/dL — AB (ref 65–99)
POTASSIUM: 3.7 mmol/L (ref 3.5–5.1)
Sodium: 137 mmol/L (ref 135–145)

## 2017-02-20 LAB — CBC WITH DIFFERENTIAL/PLATELET
BASOS PCT: 0 %
Basophils Absolute: 0 10*3/uL (ref 0.0–0.1)
Eosinophils Absolute: 0 10*3/uL (ref 0.0–0.7)
Eosinophils Relative: 0 %
HEMATOCRIT: 48.1 % (ref 39.0–52.0)
HEMOGLOBIN: 15.1 g/dL (ref 13.0–17.0)
LYMPHS ABS: 1.1 10*3/uL (ref 0.7–4.0)
LYMPHS PCT: 18 %
MCH: 30.3 pg (ref 26.0–34.0)
MCHC: 31.4 g/dL (ref 30.0–36.0)
MCV: 96.4 fL (ref 78.0–100.0)
Monocytes Absolute: 0.3 10*3/uL (ref 0.1–1.0)
Monocytes Relative: 6 %
NEUTROS ABS: 4.5 10*3/uL (ref 1.7–7.7)
NEUTROS PCT: 76 %
Platelets: 78 10*3/uL — ABNORMAL LOW (ref 150–400)
RBC: 4.99 MIL/uL (ref 4.22–5.81)
RDW: 17.3 % — ABNORMAL HIGH (ref 11.5–15.5)
WBC: 5.9 10*3/uL (ref 4.0–10.5)

## 2017-02-20 LAB — BLOOD GAS, ARTERIAL
Acid-Base Excess: 8.4 mmol/L — ABNORMAL HIGH (ref 0.0–2.0)
BICARBONATE: 32.4 mmol/L — AB (ref 20.0–28.0)
Drawn by: 252031
FIO2: 50
LHR: 28 {breaths}/min
MECHVT: 600 mL
O2 Saturation: 91.8 %
PATIENT TEMPERATURE: 99.4
PCO2 ART: 46.1 mmHg (ref 32.0–48.0)
PEEP/CPAP: 12 cmH2O
PO2 ART: 61 mmHg — AB (ref 83.0–108.0)
pH, Arterial: 7.463 — ABNORMAL HIGH (ref 7.350–7.450)

## 2017-02-20 LAB — GLUCOSE, CAPILLARY
GLUCOSE-CAPILLARY: 104 mg/dL — AB (ref 65–99)
GLUCOSE-CAPILLARY: 113 mg/dL — AB (ref 65–99)
GLUCOSE-CAPILLARY: 118 mg/dL — AB (ref 65–99)
GLUCOSE-CAPILLARY: 122 mg/dL — AB (ref 65–99)
GLUCOSE-CAPILLARY: 123 mg/dL — AB (ref 65–99)
GLUCOSE-CAPILLARY: 155 mg/dL — AB (ref 65–99)
Glucose-Capillary: 126 mg/dL — ABNORMAL HIGH (ref 65–99)

## 2017-02-20 LAB — CULTURE, BAL-QUANTITATIVE W GRAM STAIN: Culture: NO GROWTH

## 2017-02-20 LAB — MAGNESIUM: MAGNESIUM: 1.6 mg/dL — AB (ref 1.7–2.4)

## 2017-02-20 LAB — PHOSPHORUS: Phosphorus: 1.8 mg/dL — ABNORMAL LOW (ref 2.5–4.6)

## 2017-02-20 LAB — CULTURE, BAL-QUANTITATIVE

## 2017-02-20 LAB — HEPARIN LEVEL (UNFRACTIONATED)
Heparin Unfractionated: <0.1 IU/mL — ABNORMAL LOW (ref 0.30–0.70)
Heparin Unfractionated: 0.11 IU/mL — ABNORMAL LOW (ref 0.30–0.70)

## 2017-02-20 MED ORDER — MIDAZOLAM HCL 2 MG/2ML IJ SOLN
INTRAMUSCULAR | Status: AC
  Filled 2017-02-20: qty 2

## 2017-02-20 MED ORDER — MIDAZOLAM HCL 2 MG/2ML IJ SOLN
2.0000 mg | Freq: Once | INTRAMUSCULAR | Status: AC
  Administered 2017-02-20: 2 mg via INTRAVENOUS

## 2017-02-20 MED ORDER — SODIUM CHLORIDE 0.9 % IV SOLN
1.0000 g | Freq: Three times a day (TID) | INTRAVENOUS | Status: DC
  Administered 2017-02-20 – 2017-02-21 (×3): 1 g via INTRAVENOUS
  Filled 2017-02-20 (×4): qty 1

## 2017-02-20 MED ORDER — GUAIFENESIN 100 MG/5ML PO SOLN
10.0000 mL | Freq: Four times a day (QID) | ORAL | Status: DC
  Administered 2017-02-20 – 2017-03-01 (×31): 200 mg via ORAL
  Filled 2017-02-20 (×37): qty 10

## 2017-02-20 MED ORDER — PRO-STAT SUGAR FREE PO LIQD
60.0000 mL | Freq: Three times a day (TID) | ORAL | Status: DC
  Administered 2017-02-20 – 2017-02-23 (×9): 60 mL
  Filled 2017-02-20 (×9): qty 60

## 2017-02-20 MED ORDER — VITAL HIGH PROTEIN PO LIQD
1000.0000 mL | ORAL | Status: DC
  Administered 2017-02-20 – 2017-02-23 (×3): 1000 mL
  Filled 2017-02-20 (×3): qty 1000

## 2017-02-20 MED ORDER — FUROSEMIDE 10 MG/ML IJ SOLN
40.0000 mg | Freq: Four times a day (QID) | INTRAMUSCULAR | Status: AC
  Administered 2017-02-20 – 2017-02-21 (×3): 40 mg via INTRAVENOUS
  Filled 2017-02-20 (×3): qty 4

## 2017-02-20 MED ORDER — MIDAZOLAM HCL 2 MG/2ML IJ SOLN
INTRAMUSCULAR | Status: AC
  Administered 2017-02-20: 2 mg
  Filled 2017-02-20: qty 2

## 2017-02-20 MED ORDER — ETOMIDATE 2 MG/ML IV SOLN
20.0000 mg | Freq: Once | INTRAVENOUS | Status: AC
  Administered 2017-02-20: 20 mg via INTRAVENOUS

## 2017-02-20 NOTE — Progress Notes (Signed)
Nutrition Follow-up  DOCUMENTATION CODES:   Morbid obesity  INTERVENTION:    Vital High Protein at 50 ml/h (1200 ml per day)  Pro-stat 60 ml TID  Provides 1800 kcal, 195 gm protein, 1003 ml free water daily  NUTRITION DIAGNOSIS:   Inadequate oral intake related to inability to eat as evidenced by NPO status.  Ongoing  GOAL:   Provide needs based on ASPEN/SCCM guidelines  Met with TF  MONITOR:   Diet advancement, Vent status, Labs, Weight trends, I & O's  REASON FOR ASSESSMENT:   Ventilator, Consult Enteral/tube feeding initiation and management  ASSESSMENT:   62 yo male with PMH of HTN, venous insufficiency, HLD, gout, NHL (in remission, last treated in 2012) who was admitted on 2/7 with acute hypoxemia respiratory failure, influenza A, and pulmonary edema. Required intubation on 2/9.  Received MD Consult for TF initiation and management. Patient is currently receiving Vital High Protein via OGT at 75 ml/h (1800 ml/day) to provide 1800 kcals, 158 gm protein, 1505 ml free water daily.  Patient is currently intubated on ventilator support Temp (24hrs), Avg:98.9 F (37.2 C), Min:97.9 F (36.6 C), Max:99.8 F (37.7 C)  Propofol: none  Labs and medications reviewed. Phosphorus 1.8 (L), magnesium 1.6 (L) CBG's: [phone]  Diet Order:  Diet NPO time specified Except for: Sips with Meds  EDUCATION NEEDS:   No education needs have been identified at this time  Skin:  Skin Assessment: Skin Integrity Issues: Skin Integrity Issues:: Other (Comment) Other: venous stasis ulcer to R ankle and L toe  Last BM:  Unknown  Height:   Ht Readings from Last 1 Encounters:  02/17/17 '5\' 11"'$  (1.803 m)    Weight:   Wt Readings from Last 1 Encounters:  02/20/17 (!) 379 lb 13.6 oz (172.3 kg)    Ideal Body Weight:  78.18 kg  BMI:  Body mass index is 52.98 kg/m.  Estimated Nutritional Needs:   Kcal:  2992-4268  Protein:  175-195 gm  Fluid:  2 L   Molli Barrows, RD, LDN, [place] Pager [phone] After Hours Pager [phone]

## 2017-02-20 NOTE — Progress Notes (Signed)
ANTICOAGULATION CONSULT NOTE  Pharmacy Consult:  Heparin Indication: atrial fibrillation  Allergies  Allergen Reactions  . Penicillins Swelling    Age 62 or 39 - pt states he had swelling all over and his lips got real big   Patient Measurements: Height: 5\' 11"  (180.3 cm) Weight: (!) 379 lb 13.6 oz (172.3 kg) IBW/kg (Calculated) : 75.3 Heparin Dosing Weight: 117 kg  Vital Signs: Temp: 98 F (36.7 C) (02/11 1554) Temp Source: Oral (02/11 1554) BP: 109/65 (02/11 1600) Pulse Rate: 88 (02/11 1700)  Labs: Recent Labs    02/18/17 2133 02/19/17 0500 02/20/17 0802 02/20/17 1649  HGB 16.3 16.5 15.1  --   HCT 50.8 51.7 48.1  --   PLT 93* 97* 78*  --   HEPARINUNFRC 0.70 0.38 <0.10* 0.11*  CREATININE 1.11 0.96 0.82  --      Assessment: 61 [name] with new Afib and started in IV heparin.  Heparin level is sub-therapeutic but trending up.  No issue with heparin infusion nor bleeding per RN.     Goal of Therapy:  Heparin level 0.3-0.7 units/ml Monitor platelets by anticoagulation protocol: Yes    Plan:  Increase heparin gtt to 1750 units/hr Check 6 hr heparin level Daily heparin level and CB   Jeffre Enriques D. Mina Marble, PharmD, BCPS Pager:  [phone] 02/20/2017, 6:07 PM

## 2017-02-20 NOTE — Progress Notes (Signed)
Pt with increased work of breathing and increased coughing on ventilator. Low return volumes visualized on ventilator. Suctioned out copious amounts of tan, yellow, thick secretions from ETT tube. RT called to bedside to assist. Significant cuff leak found. CCMD Byrum notified. Will continue to closely monitor.

## 2017-02-20 NOTE — Progress Notes (Signed)
[facility] for Heparin Indication: atrial fibrillation  Allergies  Allergen Reactions  . Penicillins Swelling    Age 62 or 89 - pt states he had swelling all over and his lips got real big   Patient Measurements: Height: 5\' 11"  (180.3 cm) Weight: (!) 379 lb 13.6 oz (172.3 kg) IBW/kg (Calculated) : 75.3 Heparin Dosing Weight: 117 kg  Vital Signs: Temp: 97.9 F (36.6 C) (02/11 0801) Temp Source: Core (02/11 0801) BP: 121/80 (02/11 0819) Pulse Rate: 79 (02/11 0900)  Labs: Recent Labs    02/18/17 0519  02/18/17 2133 02/19/17 0500 02/20/17 0802  HGB 15.3  --  16.3 16.5  --   HCT 47.5  --  50.8 51.7  --   PLT 85*  --  93* 97*  --   HEPARINUNFRC 0.97*   < > 0.70 0.38 <0.10*  CREATININE 0.94  --  1.11 0.96  --    < > = values in this interval not displayed.   Assessment: 62 year old male with recent pneumonia, brought from home by EMS for SOB and found to be in new atrial fibrillation.  Heparin level is subtherapeutic this morning at < 0.10. Spoke with nurse and heparin has not been inturrupted or off.  Level was drawn from a-line. H/H and plts down slightly this morning (likely dilutional). No signs of bleeding have been noted.   Goal of Therapy:  Heparin level 0.3-0.7 units/ml Monitor platelets by anticoagulation protocol: Yes   Plan:  -Increase heparin to 1350 units/hr -6 hour heparin level  -Daily Heparin level, CBC -Monitor s/sx of bleeding  Jimmy Footman, PharmD, BCPS PGY2 Infectious Diseases Pharmacy Resident Pager: [phone]  02/20/2017 9:31 AM

## 2017-02-20 NOTE — Procedures (Signed)
Intubation and Bronchoscopy Procedure Note SEVERO BEBER [id number] 1955-07-26  Procedure: Intubation Indications: ETT needed to be changed  Procedure Details Consent: Risks of procedure as well as the alternatives and risks of each were explained to the (patient/caregiver).  Consent for procedure obtained. Time Out: Verified patient identification, verified procedure, site/side was marked, verified correct patient position, special equipment/implants available, medications/allergies/relevent history reviewed, required imaging and test results available.  Performed  Maximum sterile technique was used including cap, gloves, hand hygiene and mask.   Patient with significant cuff leak. FOB introduced via ETT to confirm that it was in airway. Once confirmed,  the ETT was changed over exchanger catheter without difficulty. Secured at 24cm at the teeth. No complications.    Evaluation Hemodynamic Status: BP stable throughout; O2 sats: transiently fell during during procedure Patient's Current Condition: stable Complications: No apparent complications Patient did tolerate procedure well.   Baltazar Apo, MD, PhD 02/20/2017, 3:45 PM [facility] [phone] or if no answer [phone]

## 2017-02-20 NOTE — Progress Notes (Addendum)
PULMONARY / CRITICAL CARE MEDICINE   Name: [name] MRN: [id number] DOB: 10/06/1955    ADMISSION DATE:  02/16/2017 CONSULTATION DATE:  02/16/2017  REFERRING MD:  Regenia Skeeter  CHIEF COMPLAINT:  DYspnea  HISTORY OF PRESENT ILLNESS:   62y/o male with a history of lymphoma in remission, last treated in 2012 who was admitted with acute hypoxemic respiratory failure in the setting of influenza and likely pulmonary edema.     SUBJECTIVE: Good diuresis last 24 hours PEEP and FiO2 remain elevated, attempting to wean Comfortable and awake, nods to questions   VITAL SIGNS: BP (!) 83/67   Pulse 84   Temp 98.5 F (36.9 C) (Oral)   Resp (!) 24   Ht _0  (1.803 m)   Wt (!) 172.3 kg (379 lb 13.6 oz)   SpO2 97%   BMI 52.98 kg/m   HEMODYNAMICS:    VENTILATOR SETTINGS: Vent Mode: PRVC FiO2 (%):  [50 %-60 %] 50 % Set Rate:  [28 bmp] 28 bmp Vt Set:  [600 mL] 600 mL PEEP:  [12 cmH20] 12 cmH20 Plateau Pressure:  [28 cmH20-29 cmH20] 29 cmH20  INTAKE / OUTPUT: I/O last 3 completed shifts: In: 5766.6 [I.V.:3034.1; NG/GT:1162.5; IV Piggyback:1570] Out: 9379 [Urine:6850]  PHYSICAL EXAMINATION:  General: Obese man mechanically ventilated HEENT: ET tube in place, OG tube in place neuro: Wakes easily to voice, nods to questions, follows commands, no focal deficits CV: Regular, no murmur PULM: Decreased at both bases, bilateral inspiratory crackles GI: Obese, soft, nontender, positive bowel sounds Extremities: Cool extremities, no deformity Skin: No rash     LABS:  BMET Recent Labs  Lab 02/18/17 2133 02/19/17 0500 02/20/17 0802  NA 133* 134* 137  K 4.7 3.9 3.7  CL 99* 96* 99*  CO2 _1 BUN 31* 28* 24*  CREATININE 1.11 0.96 0.82  GLUCOSE 168* 158* 161*    Electrolytes Recent Labs  Lab 02/18/17 2133 02/19/17 0500 02/20/17 0802  CALCIUM 7.4* 7.5* 7.5*  MG 1.9 1.9 1.6*  PHOS 2.1* 1.8* 1.8*    CBC Recent Labs  Lab 02/18/17 2133 02/19/17 0500  02/20/17 0802  WBC 8.7 8.8 5.9  HGB 16.3 16.5 15.1  HCT 50.8 51.7 48.1  PLT 93* 97* 78*    Coag's Recent Labs  Lab 02/16/17 1710  INR 1.14    Sepsis Markers Recent Labs  Lab 02/16/17 1944 02/16/17 2112 02/16/17 2300 02/17/17 0015 02/17/17 0612 02/18/17 0519  LATICACIDVEN 2.00*  --  1.8 1.7  --   --   PROCALCITON  --  0.33  --   --  0.37 0.39    ABG Recent Labs  Lab 02/19/17 0323 02/19/17 2010 02/20/17 0338  PHART 7.355 7.485* 7.463*  PCO2ART 47.6 44.4 46.1  PO2ART 66.0* 60.4* 61.0*    Liver Enzymes Recent Labs  Lab 02/16/17 1710 02/17/17 0612  AST 115* 100*  ALT 66* 57  ALKPHOS 185* 158*  BILITOT 1.0 0.9  ALBUMIN 3.0* 2.6*    Cardiac Enzymes Recent Labs  Lab 02/16/17 2112 02/17/17 0612 02/17/17 0912  TROPONINI 0.05* 0.05* 0.06*    Glucose Recent Labs  Lab 02/19/17 1619 02/19/17 2012 02/20/17 0015 02/20/17 0354 02/20/17 0757 02/20/17 1102  GLUCAP 129* 153* 122* 126* 155* 113*    Imaging Dg Chest Port 1 View  Result Date: 02/20/2017 CLINICAL DATA:  Respiratory failure EXAM: PORTABLE CHEST 1 VIEW COMPARISON:  Yesterday FINDINGS: Porta catheter on the left with tip at the upper cavoatrial junction. Orogastric tube tip  at least reaches the diaphragm. Endotracheal tube tip at the clavicular heads. There is widespread airspace opacity. Low lung volumes and cardiomegaly. IMPRESSION: 1. Widespread airspace disease that could be edema or infection. 2. Low volumes and stable cardiomegaly. 3. Stable positioning of endotracheal and orogastric tubes. Electronically Signed   By: Monte Fantasia M.D.   On: 02/20/2017 07:12   Dg Abd Portable 1v  Result Date: 02/20/2017 CLINICAL DATA:  Orogastric tube placement. EXAM: PORTABLE ABDOMEN - 1 VIEW COMPARISON:  02/16/2017 FINDINGS: An enteric tube has been placed and terminates in the left upper quadrant likely in the proximal stomach with both tip and side hole beyond the level of the GE junction. No dilated  bowel loops are seen in the included portion of the upper abdomen. The lung bases were more fully evaluated on a chest radiograph today. IMPRESSION: Enteric tube in the proximal stomach. Electronically Signed   By: Logan Bores M.D.   On: 02/20/2017 11:32      STUDIES:  2/8 echo > EF 25-30% moderately dilated left ventricle, diffuse hypokinesis mild mitral valve regurgitation, left atrium mildly dilated  CULTURES: 2/7 BCx 2 >> 2/7 RVP >> influenza A 2/7 Sputum cx >> normal flora 02/18/2017 BAL>>  ANTIBIOTICS: Levaquin 2/3 >2/5; 2/7 x 1 Vancomycin 2/7 >> Azactam 2/7 >>  Tamiflu 2/8 >   SIGNIFICANT EVENTS: 2/7 Admit 02/18/2017 intubated for acute respiratory failure  LINES/TUBES: Left chest port >> accessed 2/7 02/18/2017 orotracheal tube>> 02/18/2017 orogastric tube>> DISCUSSION: 62 y/o male with acute hypoxemic respiratory failure due to severe CAP, may have diastolic heart failure. Required  intubation 02/18/2017 for acute on chronic respiratory failure with failed attempt at BiPAP.  ASSESSMENT / PLAN:  PULMONARY A: Acute respiratory failure with hypoxemia Acute pulmonary edema Influeanza A Vent dependent respiratory failure as of 02/18/2017 P:   Continue current ventilator support Wean FiO2 and PEEP as able VAP prevention orders Await culture data Tamiflu and antibiotics as below  CARDIOVASCULAR A:  Heart failure? proBNP elevated, clinical history supportive Atrial fib P:  Repeat diuresis 2/11, continue as blood pressure and renal function will tolerate Wean pressors, goal to off Continue heparin gtt  RENAL A:   Hypophosphatemia P:   Replace electro lites as indicated Follow urine output and BMP Repeat diuresis 02/20/17  GASTROINTESTINAL A:   No acute issues P:   Continue tube feeding  HEMATOLOGIC A:   Mild thrombocytopenia History of non-hodgkins lymphoma P:  Follow CBC Follow for any evidence of blood loss  INFECTIOUS A:   Severe  CAP Influenza Bilateral foot wounds P:   Currently on vancomycin, Azactam, Tamiflu.  Consider narrowing based on negative culture results 2/12 Appreciate wound care consult  ENDOCRINE A:   DM2   P:   Sliding scale insulin as ordered  NEUROLOGIC A:   Sedation for mechanical ventilation P:   Precedex, fentanyl as ordered Minimize sedation as able   FAMILY  Reviewed status with sister at bedside 2/11  Independent CC time 27 minutes  Baltazar Apo, MD, PhD 02/20/2017, 1:37 PM [facility] [phone] or if no answer [phone]

## 2017-02-21 LAB — CBC
HEMATOCRIT: 45.1 % (ref 39.0–52.0)
Hemoglobin: 14.3 g/dL (ref 13.0–17.0)
MCH: 30.7 pg (ref 26.0–34.0)
MCHC: 31.7 g/dL (ref 30.0–36.0)
MCV: 96.8 fL (ref 78.0–100.0)
Platelets: 78 10*3/uL — ABNORMAL LOW (ref 150–400)
RBC: 4.66 MIL/uL (ref 4.22–5.81)
RDW: 17.3 % — AB (ref 11.5–15.5)
WBC: 6.2 10*3/uL (ref 4.0–10.5)

## 2017-02-21 LAB — CULTURE, BLOOD (ROUTINE X 2)
CULTURE: NO GROWTH
CULTURE: NO GROWTH
SPECIAL REQUESTS: ADEQUATE
Special Requests: ADEQUATE

## 2017-02-21 LAB — BLOOD GAS, ARTERIAL
Acid-Base Excess: 8.9 mmol/L — ABNORMAL HIGH (ref 0.0–2.0)
BICARBONATE: 33.7 mmol/L — AB (ref 20.0–28.0)
Drawn by: 34560
FIO2: 50
LHR: 28 {breaths}/min
MECHVT: 500 mL
O2 SAT: 92.1 %
PATIENT TEMPERATURE: 98.6
PCO2 ART: 53 mmHg — AB (ref 32.0–48.0)
PEEP/CPAP: 12 cmH2O
PH ART: 7.419 (ref 7.350–7.450)
PO2 ART: 64.7 mmHg — AB (ref 83.0–108.0)

## 2017-02-21 LAB — BASIC METABOLIC PANEL
Anion gap: 11 (ref 5–15)
BUN: 28 mg/dL — AB (ref 6–20)
CALCIUM: 7.7 mg/dL — AB (ref 8.9–10.3)
CO2: 29 mmol/L (ref 22–32)
Chloride: 101 mmol/L (ref 101–111)
Creatinine, Ser: 0.87 mg/dL (ref 0.61–1.24)
GFR calc Af Amer: >60 mL/min (ref >60)
GLUCOSE: 137 mg/dL — AB (ref 65–99)
POTASSIUM: 3.8 mmol/L (ref 3.5–5.1)
Sodium: 141 mmol/L (ref 135–145)

## 2017-02-21 LAB — GLUCOSE, CAPILLARY
GLUCOSE-CAPILLARY: 115 mg/dL — AB (ref 65–99)
GLUCOSE-CAPILLARY: 136 mg/dL — AB (ref 65–99)
GLUCOSE-CAPILLARY: 154 mg/dL — AB (ref 65–99)
Glucose-Capillary: 116 mg/dL — ABNORMAL HIGH (ref 65–99)
Glucose-Capillary: 110 mg/dL — ABNORMAL HIGH (ref 65–99)

## 2017-02-21 LAB — HEPARIN LEVEL (UNFRACTIONATED)
HEPARIN UNFRACTIONATED: 0.22 [IU]/mL — AB (ref 0.30–0.70)
Heparin Unfractionated: 0.19 IU/mL — ABNORMAL LOW (ref 0.30–0.70)
Heparin Unfractionated: 0.38 IU/mL (ref 0.30–0.70)

## 2017-02-21 LAB — PHOSPHORUS: Phosphorus: 2.4 mg/dL — ABNORMAL LOW (ref 2.5–4.6)

## 2017-02-21 LAB — MAGNESIUM: Magnesium: 1.4 mg/dL — ABNORMAL LOW (ref 1.7–2.4)

## 2017-02-21 LAB — MRSA PCR SCREENING: MRSA by PCR: NEGATIVE

## 2017-02-21 MED ORDER — FUROSEMIDE 10 MG/ML IJ SOLN
80.0000 mg | Freq: Four times a day (QID) | INTRAMUSCULAR | Status: AC
  Administered 2017-02-21 (×3): 80 mg via INTRAVENOUS
  Filled 2017-02-21 (×4): qty 8

## 2017-02-21 MED ORDER — MIDAZOLAM HCL 2 MG/2ML IJ SOLN
2.0000 mg | Freq: Once | INTRAMUSCULAR | Status: AC
  Administered 2017-02-21: 2 mg via INTRAVENOUS

## 2017-02-21 MED ORDER — PANTOPRAZOLE SODIUM 40 MG PO PACK
40.0000 mg | PACK | Freq: Every day | ORAL | Status: DC
  Administered 2017-02-21 – 2017-02-22 (×2): 40 mg
  Filled 2017-02-21 (×2): qty 20

## 2017-02-21 MED ORDER — SODIUM GLYCEROPHOSPHATE 1 MMOLE/ML IV SOLN
20.0000 mmol | INTRAVENOUS | Status: AC
  Administered 2017-02-21 (×2): 20 mmol via INTRAVENOUS
  Filled 2017-02-21 (×2): qty 20

## 2017-02-21 MED ORDER — MIDAZOLAM HCL 2 MG/2ML IJ SOLN
INTRAMUSCULAR | Status: AC
  Administered 2017-02-21: 2 mg
  Filled 2017-02-21: qty 4

## 2017-02-21 MED ORDER — MAGNESIUM SULFATE 2 GM/50ML IV SOLN
2.0000 g | Freq: Once | INTRAVENOUS | Status: AC
  Administered 2017-02-21: 2 g via INTRAVENOUS
  Filled 2017-02-21: qty 50

## 2017-02-21 NOTE — Progress Notes (Signed)
ANTICOAGULATION CONSULT NOTE  Pharmacy Consult:  Heparin Indication: atrial fibrillation  Allergies  Allergen Reactions  . Penicillins Swelling    Age 62 or 3 - pt states he had swelling all over and his lips got real big   Patient Measurements: Height: 5\' 11"  (180.3 cm) Weight: (!) 378 lb 5 oz (171.6 kg) IBW/kg (Calculated) : 75.3 Heparin Dosing Weight: 117 kg  Vital Signs: Temp: 99.5 F (37.5 C) (02/12 1139) Temp Source: Oral (02/12 1139) BP: 155/70 (02/12 1139) Pulse Rate: 101 (02/12 1139)  Labs: Recent Labs    02/19/17 0500 02/20/17 0802 02/20/17 1649 02/21/17 0421 02/21/17 0423 02/21/17 1236  HGB 16.5 15.1  --   --  14.3  --   HCT 51.7 48.1  --   --  45.1  --   PLT 97* 78*  --   --  78*  --   HEPARINUNFRC 0.38 <0.10* 0.11* 0.22*  --  0.19*  CREATININE 0.96 0.82  --   --  0.87  --      Assessment: 33 [name] with new Afib and started on IV heparin.  Heparin level is still subtherapeutic and has not moved. Spoke with nurse and appears rate was not changed this AM in Epic. CBC and platelets today are stable and no signs of bleeding.   Goal of Therapy:  Heparin level 0.3-0.7 units/ml Monitor platelets by anticoagulation protocol: Yes    Plan:  Increase heparin gtt to 2050 units/hr Check 6 hr heparin level Daily heparin level and CBC    Jimmy Footman, PharmD, BCPS PGY2 Infectious Diseases Pharmacy Resident Pager: [phone]  02/21/2017 1:21 PM

## 2017-02-21 NOTE — Progress Notes (Signed)
ANTICOAGULATION CONSULT NOTE  Pharmacy Consult:  Heparin Indication: atrial fibrillation  Allergies  Allergen Reactions  . Penicillins Swelling    Age 62 or 18 - pt states he had swelling all over and his lips got real big   Patient Measurements: Height: 5\' 11"  (180.3 cm) Weight: (!) 378 lb 5 oz (171.6 kg) IBW/kg (Calculated) : 75.3 Heparin Dosing Weight: 117 kg  Vital Signs: Temp: 97.5 F (36.4 C) (02/12 1946) Temp Source: Oral (02/12 1946) BP: 96/57 (02/12 2000) Pulse Rate: 85 (02/12 2000)  Labs: Recent Labs    02/19/17 0500 02/20/17 0802  02/21/17 0421 02/21/17 0423 02/21/17 1236 02/21/17 1952  HGB 16.5 15.1  --   --  14.3  --   --   HCT 51.7 48.1  --   --  45.1  --   --   PLT 97* 78*  --   --  78*  --   --   HEPARINUNFRC 0.38 <0.10*   < > 0.22*  --  0.19* 0.38  CREATININE 0.96 0.82  --   --  0.87  --   --    < > = values in this interval not displayed.     Assessment: 74 [name] with new Afib started on IV heparin.  Heparin level is therapeutic; no bleeding reported.   Goal of Therapy:  Heparin level 0.3-0.7 units/ml Monitor platelets by anticoagulation protocol: Yes    Plan:  Continue heparin gtt at 2050 units/hr Daily heparin level and CBC   Camren Henthorn D. Mina Marble, PharmD, BCPS Pager:  [phone] 02/21/2017, 8:44 PM

## 2017-02-21 NOTE — Progress Notes (Signed)
ANTICOAGULATION CONSULT NOTE  Pharmacy Consult:  Heparin Indication: atrial fibrillation  Allergies  Allergen Reactions  . Penicillins Swelling    Age 62 or 62 - pt states he had swelling all over and his lips got real big   Patient Measurements: Height: 5\' 11"  (180.3 cm) Weight: (!) 379 lb 13.6 oz (172.3 kg) IBW/kg (Calculated) : 75.3 Heparin Dosing Weight: 117 kg  Vital Signs: Temp: 98.8 F (37.1 C) (02/12 0328) Temp Source: Oral (02/12 0328) BP: 89/71 (02/12 0400) Pulse Rate: 83 (02/12 0500)  Labs: Recent Labs    02/19/17 0500 02/20/17 0802 02/20/17 1649 02/21/17 0421 02/21/17 0423  HGB 16.5 15.1  --   --  14.3  HCT 51.7 48.1  --   --  45.1  PLT 97* 78*  --   --  78*  HEPARINUNFRC 0.38 <0.10* 0.11* 0.22*  --   CREATININE 0.96 0.82  --   --  0.87     Assessment: 61 [name] with new Afib and started in IV heparin.  Heparin level is sub-therapeutic but trending up.     Goal of Therapy:  Heparin level 0.3-0.7 units/ml Monitor platelets by anticoagulation protocol: Yes    Plan:  Increase heparin gtt to 2050 units/hr Check 6 hr heparin level Daily heparin level and CBC    Hughes Better, PharmD, BCPS Clinical Pharmacist 02/21/2017 5:43 AM

## 2017-02-21 NOTE — Progress Notes (Signed)
PULMONARY / CRITICAL CARE MEDICINE   Name: TERESO UNANGST MRN: [id number] DOB: 1955-01-26    ADMISSION DATE:  02/16/2017 CONSULTATION DATE:  02/16/2017  REFERRING MD:  Regenia Skeeter  CHIEF COMPLAINT:  DYspnea  HISTORY OF PRESENT ILLNESS:   62y/o male with a history of lymphoma in remission, last treated in 2012 who was admitted with acute hypoxemic respiratory failure in the setting of influenza and likely pulmonary edema.     SUBJECTIVE: ETT changed 2/11   VITAL SIGNS: BP (!) 124/57   Pulse 89   Temp 99 F (37.2 C) (Axillary)   Resp (!) 28   Ht _0  (1.803 m)   Wt (!) 171.6 kg (378 lb 5 oz)   SpO2 95%   BMI 52.76 kg/m   HEMODYNAMICS:    VENTILATOR SETTINGS: Vent Mode: PRVC FiO2 (%):  [50 %-60 %] 50 % Set Rate:  [28 bmp] 28 bmp Vt Set:  [500 mL-600 mL] 500 mL PEEP:  [12 cmH20] 12 cmH20 Plateau Pressure:  [19 cmH20-29 cmH20] 26 cmH20  INTAKE / OUTPUT: I/O last 3 completed shifts: In: 5363.4 [I.V.:1693.4; NG/GT:2000; IV Piggyback:1670] Out: 1749 [Urine:5300]  PHYSICAL EXAMINATION:  General: Obese man mechanically ventilated HEENT: ET tube in place no evidence of cuff leaking neuro: Awake, tracks, follows commands, moves all extremities CV: Regular, no murmur, 2+ lower extremity pitting edema PULM: Very distant, decreased at both bases, bilateral inspiratory crackles, no wheeze GI: Obese, soft, nontender, positive bowel sounds Extremities: No deformity Skin: Erythema and swelling bilateral feet, no rash     LABS:  BMET Recent Labs  Lab 02/19/17 0500 02/20/17 0802 02/21/17 0423  NA 134* 137 141  K 3.9 3.7 3.8  CL 96* 99* 101  CO2 _1 BUN 28* 24* 28*  CREATININE 0.96 0.82 0.87  GLUCOSE 158* 161* 137*    Electrolytes Recent Labs  Lab 02/19/17 0500 02/20/17 0802 02/21/17 0423  CALCIUM 7.5* 7.5* 7.7*  MG 1.9 1.6* 1.4*  PHOS 1.8* 1.8* 2.4*    CBC Recent Labs  Lab 02/19/17 0500 02/20/17 0802 02/21/17 0423  WBC 8.8 5.9 6.2  HGB 16.5  15.1 14.3  HCT 51.7 48.1 45.1  PLT 97* 78* 78*    Coag's Recent Labs  Lab 02/16/17 1710  INR 1.14    Sepsis Markers Recent Labs  Lab 02/16/17 1944 02/16/17 2112 02/16/17 2300 02/17/17 0015 02/17/17 0612 02/18/17 0519  LATICACIDVEN 2.00*  --  1.8 1.7  --   --   PROCALCITON  --  0.33  --   --  0.37 0.39    ABG Recent Labs  Lab 02/19/17 2010 02/20/17 0338 02/21/17 0355  PHART 7.485* 7.463* 7.419  PCO2ART 44.4 46.1 53.0*  PO2ART 60.4* 61.0* 64.7*    Liver Enzymes Recent Labs  Lab 02/16/17 1710 02/17/17 0612  AST 115* 100*  ALT 66* 57  ALKPHOS 185* 158*  BILITOT 1.0 0.9  ALBUMIN 3.0* 2.6*    Cardiac Enzymes Recent Labs  Lab 02/16/17 2112 02/17/17 0612 02/17/17 0912  TROPONINI 0.05* 0.05* 0.06*    Glucose Recent Labs  Lab 02/20/17 1102 02/20/17 1552 02/20/17 1948 02/20/17 2341 02/21/17 0327 02/21/17 0736  GLUCAP 113* 104* 123* 118* 116* 110*    Imaging Dg Chest Port 1v Same Day  Result Date: 02/20/2017 CLINICAL DATA:  Intubation. EXAM: PORTABLE CHEST 1 VIEW COMPARISON:  02/20/2017 at 4:33 a.m. FINDINGS: Nasogastric tube courses into the region of the stomach and off the inferior portion of the film. Endotracheal tube  has tip 5.2 cm above the carina. Left subclavian Port-A-Cath unchanged with tip over the SVC. Lungs are adequately inflated with mild interval improvement in the hazy bilateral perihilar airspace process likely improving interstitial edema versus infection. No definite effusion. Stable cardiomegaly. Remainder of the exam is unchanged. IMPRESSION: Mild interval improvement of bilateral hazy perihilar airspace opacification likely improving edema or infection. Tubes and lines as described. Electronically Signed   By: Marin Olp M.D.   On: 02/20/2017 16:04   Dg Abd Portable 1v  Result Date: 02/20/2017 CLINICAL DATA:  Orogastric tube placement. EXAM: PORTABLE ABDOMEN - 1 VIEW COMPARISON:  02/16/2017 FINDINGS: An enteric tube has been  placed and terminates in the left upper quadrant likely in the proximal stomach with both tip and side hole beyond the level of the GE junction. No dilated bowel loops are seen in the included portion of the upper abdomen. The lung bases were more fully evaluated on a chest radiograph today. IMPRESSION: Enteric tube in the proximal stomach. Electronically Signed   By: Logan Bores M.D.   On: 02/20/2017 11:32    STUDIES:  2/8 echo > EF 25-30% moderately dilated left ventricle, diffuse hypokinesis mild mitral valve regurgitation, left atrium mildly dilated  CULTURES: 2/7 BCx 2 >> negative 2/7 RVP >> influenza A 2/7 Sputum cx >> normal flora 02/18/2017 BAL>> negative  ANTIBIOTICS: Levaquin 2/3 >2/5; 2/7 x 1 Vancomycin 2/7 >> 2/12 Azactam 2/7 >> 2/12 Tamiflu 2/8 >   SIGNIFICANT EVENTS: 2/7 Admit 02/18/2017 intubated for acute respiratory failure  LINES/TUBES: Left chest port >> accessed 2/7 02/18/2017 orotracheal tube>> 02/18/2017 orogastric tube>> DISCUSSION: 62 y/o male with acute hypoxemic respiratory failure due to severe CAP, may have diastolic heart failure. Cx's negative, flu A positive VDRF 2/9  ASSESSMENT / PLAN:  PULMONARY A: Acute respiratory failure with hypoxemia Acute pulmonary edema Influeanza A Vent dependent respiratory failure as of 02/18/2017 P:   Continue current ventilator support Goal wean FiO2 and PEEP VAP prevention orders Culture data all negative with the exception of influenza A Continue Tamiflu adjust antibiotics as below  CARDIOVASCULAR A:  Heart failure? proBNP elevated, clinical history supportive Atrial fib P:  Continue diuretics as blood pressure and renal function will tolerate Pressors weaned to off Continue heparin  RENAL A:   Hypophosphatemia Hypomagnesemia P:   Replete electrolytes as indicated Follow urine output and BMP Repeat diuresis 02/21/17  GASTROINTESTINAL A:   No acute issues P:   Tube feeding as  ordered  HEMATOLOGIC A:   Mild thrombocytopenia History of non-hodgkins lymphoma P:  Follow CBC intermittently No evidence of active blood loss  INFECTIOUS A:   Severe CAP, culture negative now suspect viral Influenza Bilateral foot wounds P:   Discontinue vancomycin, Azactam on 2/12 given negative cultures Complete course of Tamiflu for severe influenza Appreciate wound care input  ENDOCRINE A:   DM2   P:   Sliding scale insulin per protocol  NEUROLOGIC A:   Sedation for mechanical ventilation P:   RASS goal -1 Minimize sedation as able   FAMILY  Reviewed status with sister at bedside 2/11 and 2/12  Independent CC time 34 minutes  Baltazar Apo, MD, PhD 02/21/2017, 10:02 AM [facility] [phone] or if no answer [phone]

## 2017-02-22 ENCOUNTER — Inpatient Hospital Stay (HOSPITAL_COMMUNITY): Payer: BLUE CROSS/BLUE SHIELD

## 2017-02-22 LAB — POCT I-STAT 3, ART BLOOD GAS (G3+)
ACID-BASE EXCESS: 15 mmol/L — AB (ref 0.0–2.0)
BICARBONATE: 41.8 mmol/L — AB (ref 20.0–28.0)
O2 Saturation: 93 %
TCO2: 44 mmol/L — ABNORMAL HIGH (ref 22–32)
pCO2 arterial: 63.9 mmHg — ABNORMAL HIGH (ref 32.0–48.0)
pH, Arterial: 7.425 (ref 7.350–7.450)
pO2, Arterial: 69 mmHg — ABNORMAL LOW (ref 83.0–108.0)

## 2017-02-22 LAB — BASIC METABOLIC PANEL
Anion gap: 12 (ref 5–15)
BUN: 31 mg/dL — ABNORMAL HIGH (ref 6–20)
CHLORIDE: 99 mmol/L — AB (ref 101–111)
CO2: 34 mmol/L — ABNORMAL HIGH (ref 22–32)
Calcium: 7.8 mg/dL — ABNORMAL LOW (ref 8.9–10.3)
Creatinine, Ser: 0.86 mg/dL (ref 0.61–1.24)
GFR calc Af Amer: >60 mL/min (ref >60)
GFR calc non Af Amer: >60 mL/min (ref >60)
Glucose, Bld: 136 mg/dL — ABNORMAL HIGH (ref 65–99)
POTASSIUM: 3.6 mmol/L (ref 3.5–5.1)
SODIUM: 145 mmol/L (ref 135–145)

## 2017-02-22 LAB — CBC
HEMATOCRIT: 44.2 % (ref 39.0–52.0)
HEMOGLOBIN: 13.7 g/dL (ref 13.0–17.0)
MCH: 30.2 pg (ref 26.0–34.0)
MCHC: 31 g/dL (ref 30.0–36.0)
MCV: 97.6 fL (ref 78.0–100.0)
Platelets: 96 10*3/uL — ABNORMAL LOW (ref 150–400)
RBC: 4.53 MIL/uL (ref 4.22–5.81)
RDW: 17.3 % — ABNORMAL HIGH (ref 11.5–15.5)
WBC: 8.7 10*3/uL (ref 4.0–10.5)

## 2017-02-22 LAB — GLUCOSE, CAPILLARY
GLUCOSE-CAPILLARY: 127 mg/dL — AB (ref 65–99)
GLUCOSE-CAPILLARY: 147 mg/dL — AB (ref 65–99)
GLUCOSE-CAPILLARY: 157 mg/dL — AB (ref 65–99)
Glucose-Capillary: 121 mg/dL — ABNORMAL HIGH (ref 65–99)
Glucose-Capillary: 132 mg/dL — ABNORMAL HIGH (ref 65–99)
Glucose-Capillary: 108 mg/dL — ABNORMAL HIGH (ref 65–99)
Glucose-Capillary: 138 mg/dL — ABNORMAL HIGH (ref 65–99)

## 2017-02-22 LAB — MAGNESIUM: MAGNESIUM: 1.6 mg/dL — AB (ref 1.7–2.4)

## 2017-02-22 LAB — HEPARIN LEVEL (UNFRACTIONATED): HEPARIN UNFRACTIONATED: 0.42 [IU]/mL (ref 0.30–0.70)

## 2017-02-22 LAB — PHOSPHORUS: PHOSPHORUS: 3.3 mg/dL (ref 2.5–4.6)

## 2017-02-22 LAB — BRAIN NATRIURETIC PEPTIDE: B Natriuretic Peptide: 401.1 pg/mL — ABNORMAL HIGH (ref 0.0–100.0)

## 2017-02-22 LAB — PROCALCITONIN: PROCALCITONIN: 0.4 ng/mL

## 2017-02-22 LAB — SEDIMENTATION RATE: Sed Rate: 57 mm/hr — ABNORMAL HIGH (ref 0–16)

## 2017-02-22 MED ORDER — FUROSEMIDE 10 MG/ML IJ SOLN
80.0000 mg | Freq: Four times a day (QID) | INTRAMUSCULAR | Status: AC
  Administered 2017-02-22 (×3): 80 mg via INTRAVENOUS
  Filled 2017-02-22 (×5): qty 8

## 2017-02-22 MED ORDER — ASPIRIN 81 MG PO CHEW
81.0000 mg | CHEWABLE_TABLET | Freq: Every day | ORAL | Status: DC
  Administered 2017-02-22 – 2017-03-21 (×28): 81 mg
  Filled 2017-02-22 (×28): qty 1

## 2017-02-22 MED ORDER — POTASSIUM CHLORIDE 20 MEQ/15ML (10%) PO SOLN
40.0000 meq | Freq: Once | ORAL | Status: AC
  Administered 2017-02-22: 40 meq
  Filled 2017-02-22: qty 30

## 2017-02-22 MED ORDER — FENTANYL BOLUS VIA INFUSION
50.0000 ug | INTRAVENOUS | Status: DC | PRN
  Administered 2017-02-22: 75 ug via INTRAVENOUS
  Administered 2017-02-22: 50 ug via INTRAVENOUS
  Administered 2017-02-22: 75 ug via INTRAVENOUS
  Filled 2017-02-22: qty 75

## 2017-02-22 MED ORDER — CARVEDILOL 12.5 MG PO TABS
12.5000 mg | ORAL_TABLET | Freq: Two times a day (BID) | ORAL | Status: DC
  Administered 2017-02-22 – 2017-03-02 (×14): 12.5 mg
  Filled 2017-02-22 (×19): qty 1

## 2017-02-22 MED ORDER — ATORVASTATIN CALCIUM 20 MG PO TABS
20.0000 mg | ORAL_TABLET | Freq: Every day | ORAL | Status: DC
  Administered 2017-02-22 – 2017-03-20 (×26): 20 mg
  Filled 2017-02-22 (×28): qty 1

## 2017-02-22 MED ORDER — MAGNESIUM SULFATE 2 GM/50ML IV SOLN
2.0000 g | Freq: Once | INTRAVENOUS | Status: AC
  Administered 2017-02-22: 2 g via INTRAVENOUS
  Filled 2017-02-22: qty 50

## 2017-02-22 NOTE — Care Management Note (Addendum)
Case Management Note  Patient Details  Name: TONATIUH MALLON MRN: [id number] Date of Birth: 03-13-1955  Subjective/Objective:   Pt admitted with flu and pulm edema            Action/Plan:   PTA independent from home alone.  Pt currently intubated.    Expected Discharge Date:                  Expected Discharge Plan:  Home/Self Care  In-House Referral:     Discharge planning Services  CM Consult  Post Acute Care Choice:    Choice offered to:     DME Arranged:    DME Agency:     HH Arranged:    HH Agency:     Status of Service:     If discussed at [employer] of Stay Meetings, dates discussed:    Additional Comments:  Maryclare Labrador, RN 02/22/2017, 3:27 PM

## 2017-02-22 NOTE — Progress Notes (Signed)
ANTICOAGULATION CONSULT NOTE  Pharmacy Consult:  Heparin Indication: atrial fibrillation  Allergies  Allergen Reactions  . Penicillins Swelling    Age 62 or 89 - pt states he had swelling all over and his lips got real big   Patient Measurements: Height: 5\' 11"  (180.3 cm) Weight: (!) 358 lb 4 oz (162.5 kg) IBW/kg (Calculated) : 75.3 Heparin Dosing Weight: 117 kg  Vital Signs: Temp: 98.2 F (36.8 C) (02/13 0431) Temp Source: Oral (02/13 0431) BP: 99/65 (02/13 0600) Pulse Rate: 82 (02/13 0600)  Labs: Recent Labs    02/20/17 0802  02/21/17 0423 02/21/17 1236 02/21/17 1952 02/22/17 0430  HGB 15.1  --  14.3  --   --  13.7  HCT 48.1  --  45.1  --   --  44.2  PLT 78*  --  78*  --   --  96*  HEPARINUNFRC <0.10*   < >  --  0.19* 0.38 0.42  CREATININE 0.82  --  0.87  --   --  0.86   < > = values in this interval not displayed.     Assessment: 63 [name] with new Afib started on IV heparin.  Heparin level this AM remains therapeutic at 0.42 on 2050 units/hr. CBC and platelets are stable and no signs of bleeding have been noted.    Goal of Therapy:  Heparin level 0.3-0.7 units/ml Monitor platelets by anticoagulation protocol: Yes    Plan:  Continue heparin gtt at 2050 units/hr Daily heparin level and CBC   Jimmy Footman, PharmD, BCPS PGY2 Infectious Diseases Pharmacy Resident Pager: [phone]  02/22/2017, 7:59 AM

## 2017-02-22 NOTE — Progress Notes (Signed)
PULMONARY / CRITICAL CARE MEDICINE   Name: [name] MRN: [id number] DOB: [birth year]    ADMISSION DATE:  02/16/2017 CONSULTATION DATE:  02/16/2017  REFERRING MD:  Regenia Skeeter  CHIEF COMPLAINT:  DYspnea  HISTORY OF PRESENT ILLNESS:   62y/o male with a history of lymphoma in remission, last treated in 2012 who was admitted with acute hypoxemic respiratory failure in the setting of influenza and likely pulmonary edema.     SUBJECTIVE: Awake, interactive. No distress.    VITAL SIGNS: BP (!) 125/52   Pulse 83   Temp 99.8 F (37.7 C) (Oral)   Resp (!) 28   Ht _0  (1.803 m)   Wt (!) 358 lb 4 oz (162.5 kg)   SpO2 100%   BMI 49.97 kg/m   HEMODYNAMICS:    VENTILATOR SETTINGS: Vent Mode: PRVC FiO2 (%):  [50 %] 50 % Set Rate:  [28 bmp] 28 bmp Vt Set:  [500 mL] 500 mL PEEP:  [12 cmH20] 12 cmH20 Plateau Pressure:  [21 cmH20-25 cmH20] 25 cmH20 Filed Weights   02/20/17 0500 02/21/17 0500 02/22/17 0500  Weight: (!) 379 lb 13.6 oz (172.3 kg) (!) 378 lb 5 oz (171.6 kg) (!) 358 lb 4 oz (162.5 kg)   INTAKE / OUTPUT:  Intake/Output Summary (Last 24 hours) at 02/22/2017 1023 Last data filed at 02/22/2017 0900 Gross per 24 hour  Intake 2797.76 ml  Output 6550 ml  Net -3752.24 ml    PHYSICAL EXAMINATION:  General: 62 year old chronically ill-appearing male currently sedated on ventilator HEENT: Normocephalic atraumatic.  His neck is large, I am unable to assess jugular venous distention.  His mucous membranes are moist.  He is orally intubated. Pulmonary: Clear to auscultation, does have occasional rhonchi that clear with suctioning.  Equal chest rise diminished bases, no accessory use. Cardiac: Irregular regular atrial fibrillation on telemetry Abdomen: Obese, soft, positive bowel sounds no organomegaly. Extremities/musculoskeletal: Generalized weakness, massive lower extremity chronic venous stasis changes and edema.  Warm to palpation, strong pulses. Neuro/psych: Moves all  extremities, tries to interact, sedated, RASS -2, no focal deficits.   LABS:  BMET Recent Labs  Lab 02/20/17 0802 02/21/17 0423 02/22/17 0430  NA 137 141 145  K 3.7 3.8 3.6  CL 99* 101 99*  CO2 28 29 34*  BUN 24* 28* 31*  CREATININE 0.82 0.87 0.86  GLUCOSE 161* 137* 136*    Electrolytes Recent Labs  Lab 02/20/17 0802 02/21/17 0423 02/22/17 0430  CALCIUM 7.5* 7.7* 7.8*  MG 1.6* 1.4* 1.6*  PHOS 1.8* 2.4* 3.3    CBC Recent Labs  Lab 02/20/17 0802 02/21/17 0423 02/22/17 0430  WBC 5.9 6.2 8.7  HGB 15.1 14.3 13.7  HCT 48.1 45.1 44.2  PLT 78* 78* 96*    Coag's Recent Labs  Lab 02/16/17 1710  INR 1.14    Sepsis Markers Recent Labs  Lab 02/16/17 1944 02/16/17 2112 02/16/17 2300 02/17/17 0015 02/17/17 0612 02/18/17 0519  LATICACIDVEN 2.00*  --  1.8 1.7  --   --   PROCALCITON  --  0.33  --   --  0.37 0.39    ABG Recent Labs  Lab 02/20/17 0338 02/21/17 0355 02/22/17 0253  PHART 7.463* 7.419 7.425  PCO2ART 46.1 53.0* 63.9*  PO2ART 61.0* 64.7* 69.0*    Liver Enzymes Recent Labs  Lab 02/16/17 1710 02/17/17 0612  AST 115* 100*  ALT 66* 57  ALKPHOS 185* 158*  BILITOT 1.0 0.9  ALBUMIN 3.0* 2.6*    Cardiac Enzymes  Recent Labs  Lab 02/16/17 2112 02/17/17 0612 02/17/17 0912  TROPONINI 0.05* 0.05* 0.06*    Glucose Recent Labs  Lab 02/21/17 0327 02/21/17 0736 02/21/17 1129 02/21/17 1544 02/21/17 2353 02/22/17 0343  GLUCAP 116* 110* 136* 154* 115* 121*    Imaging Dg Chest Port 1 View  Result Date: 02/22/2017 CLINICAL DATA:  Hypoxia EXAM: PORTABLE CHEST 1 VIEW COMPARISON:  February 20, 2017 FINDINGS: Endotracheal tube tip is 4.7 cm above the carina. Nasogastric tube tip and side port are below the diaphragm. Port-A-Cath tip is in superior vena cava. No pneumothorax. There is patchy airspace consolidation in both lower lobes. There is underlying interstitial edema. There is cardiomegaly. The pulmonary vascularity is normal. No  adenopathy. No evident bone lesions. IMPRESSION: Tube and catheter positions as described without pneumothorax. Bibasilar airspace consolidation consistent with a degree of pneumonia persists. There is underlying interstitial edema, stable. No new opacity. Stable cardiomegaly. Electronically Signed   By: Lowella Grip III M.D.   On: 02/22/2017 07:04    STUDIES:  2/8 echo > EF 25-30% moderately dilated left ventricle, diffuse hypokinesis mild mitral valve regurgitation, left atrium mildly dilated  CULTURES: 2/7 BCx 2 >> negative 2/7 RVP >> influenza A 2/7 Sputum cx >> normal flora 02/18/2017 BAL>> negative  ANTIBIOTICS: Levaquin 2/3 >2/5; 2/7 x 1 Vancomycin 2/7 >> 2/12 Azactam 2/7 >> 2/12 Tamiflu 2/8 >   SIGNIFICANT EVENTS: 2/7 Admit 02/18/2017 intubated for acute respiratory failure  LINES/TUBES: Left chest port >> accessed 2/7 02/18/2017 orotracheal tube>> 02/18/2017 orogastric tube>> DISCUSSION: 62 y/o male with acute hypoxemic respiratory failure due to severe CAP, may have diastolic heart failure. Cx's negative, flu A positive VDRF 2/9  ASSESSMENT / PLAN:  Acute respiratory failure with hypoxemia in setting of viral pneumonitis (Influenza A) c/b Acute pulmonary edema PCXR: no sig change in bilateral airspace disease.  Plan Cont full vent support. Hope to get PEEP down to ~ 10 today Cont PAD protocol; RAS goal -2 Cont to push lasix; aiming for negative volume status as long as BP/BUN/cr allow F/u PCXR in am Send ESR and BNP for am labs   Acute systolic HF w/ pulmonary edema  Atrial fib Plan Cont heparin gtt Cont tele Repeating diuresis (see above) Add back coreg VT Add back lipitor  Add  asa  Will need cards eval for new Systolic CM  Intermittent Fluid and electrolyte imbalance: hypomagnesemia Plan Replace and recheck chem in am KVO IVFs  Mild thrombocytopenia-->improving History of non-hodgkins lymphoma Plan Trend cbc  Bilateral foot  wounds Plan Wound care following   DM2   Plan Trend glucose   FAMILY  Reviewed status with sister at bedside 2/11 and 2/12  DVT prophylaxis: heparin gtt SUP: ppi Diet: tubefeeds Activity: BR Disposition : ICU  My cct 40 min Erick Colace ACNP-BC [place] Pager # [phone] OR # [phone] if no answer

## 2017-02-23 ENCOUNTER — Inpatient Hospital Stay (HOSPITAL_COMMUNITY): Payer: BLUE CROSS/BLUE SHIELD

## 2017-02-23 DIAGNOSIS — Z930 Tracheostomy status: Secondary | ICD-10-CM

## 2017-02-23 LAB — GLUCOSE, CAPILLARY
GLUCOSE-CAPILLARY: 115 mg/dL — AB (ref 65–99)
Glucose-Capillary: 129 mg/dL — ABNORMAL HIGH (ref 65–99)
Glucose-Capillary: 119 mg/dL — ABNORMAL HIGH (ref 65–99)
Glucose-Capillary: 135 mg/dL — ABNORMAL HIGH (ref 65–99)
Glucose-Capillary: 126 mg/dL — ABNORMAL HIGH (ref 65–99)

## 2017-02-23 LAB — POCT I-STAT 3, ART BLOOD GAS (G3+)
Acid-Base Excess: 20 mmol/L — ABNORMAL HIGH (ref 0.0–2.0)
BICARBONATE: 48.1 mmol/L — AB (ref 20.0–28.0)
O2 Saturation: 88 %
PCO2 ART: 65.2 mmHg — AB (ref 32.0–48.0)
PH ART: 7.476 — AB (ref 7.350–7.450)
PO2 ART: 55 mmHg — AB (ref 83.0–108.0)
Patient temperature: 98.6
TCO2: >50 mmol/L — ABNORMAL HIGH (ref 22–32)

## 2017-02-23 LAB — BASIC METABOLIC PANEL
ANION GAP: 13 (ref 5–15)
BUN: 30 mg/dL — ABNORMAL HIGH (ref 6–20)
CALCIUM: 8.5 mg/dL — AB (ref 8.9–10.3)
CO2: 36 mmol/L — ABNORMAL HIGH (ref 22–32)
Chloride: 98 mmol/L — ABNORMAL LOW (ref 101–111)
Creatinine, Ser: 0.78 mg/dL (ref 0.61–1.24)
GFR calc Af Amer: >60 mL/min (ref >60)
Glucose, Bld: 156 mg/dL — ABNORMAL HIGH (ref 65–99)
POTASSIUM: 3.5 mmol/L (ref 3.5–5.1)
SODIUM: 147 mmol/L — AB (ref 135–145)

## 2017-02-23 LAB — CBC
HCT: 45.8 % (ref 39.0–52.0)
HEMOGLOBIN: 14.3 g/dL (ref 13.0–17.0)
MCH: 30.6 pg (ref 26.0–34.0)
MCHC: 31.2 g/dL (ref 30.0–36.0)
MCV: 98.1 fL (ref 78.0–100.0)
Platelets: 140 10*3/uL — ABNORMAL LOW (ref 150–400)
RBC: 4.67 MIL/uL (ref 4.22–5.81)
RDW: 17 % — ABNORMAL HIGH (ref 11.5–15.5)
WBC: 13.5 10*3/uL — AB (ref 4.0–10.5)

## 2017-02-23 LAB — MAGNESIUM: Magnesium: 1.7 mg/dL (ref 1.7–2.4)

## 2017-02-23 LAB — HEPARIN LEVEL (UNFRACTIONATED): HEPARIN UNFRACTIONATED: 0.39 [IU]/mL (ref 0.30–0.70)

## 2017-02-23 LAB — TRIGLYCERIDES: TRIGLYCERIDES: 113 mg/dL (ref <150)

## 2017-02-23 LAB — PHOSPHORUS: PHOSPHORUS: 2.4 mg/dL — AB (ref 2.5–4.6)

## 2017-02-23 LAB — PROCALCITONIN: PROCALCITONIN: 0.33 ng/mL

## 2017-02-23 MED ORDER — LABETALOL HCL 5 MG/ML IV SOLN
20.0000 mg | INTRAVENOUS | Status: DC | PRN
  Administered 2017-02-23: 20 mg via INTRAVENOUS
  Filled 2017-02-23: qty 4

## 2017-02-23 MED ORDER — ROCURONIUM BROMIDE 50 MG/5ML IV SOLN
70.0000 mg | Freq: Once | INTRAVENOUS | Status: AC
  Administered 2017-02-23: 70 mg via INTRAVENOUS
  Filled 2017-02-23: qty 7

## 2017-02-23 MED ORDER — MIDAZOLAM HCL 2 MG/2ML IJ SOLN
4.0000 mg | Freq: Once | INTRAMUSCULAR | Status: AC
  Administered 2017-02-23: 4 mg via INTRAVENOUS
  Filled 2017-02-23: qty 4

## 2017-02-23 MED ORDER — HEPARIN (PORCINE) IN NACL 100-0.45 UNIT/ML-% IJ SOLN
2200.0000 [IU]/h | INTRAMUSCULAR | Status: DC
  Administered 2017-02-23: 2050 [IU]/h via INTRAVENOUS
  Administered 2017-02-25 – 2017-02-28 (×8): 2300 [IU]/h via INTRAVENOUS
  Administered 2017-03-01: 2200 [IU]/h via INTRAVENOUS
  Administered 2017-03-01: 2300 [IU]/h via INTRAVENOUS
  Filled 2017-02-23 (×27): qty 250

## 2017-02-23 MED ORDER — FENTANYL CITRATE (PF) 100 MCG/2ML IJ SOLN
25.0000 ug | INTRAMUSCULAR | Status: DC | PRN
  Administered 2017-02-23 – 2017-02-27 (×3): 100 ug via INTRAVENOUS
  Administered 2017-02-27: 50 ug via INTRAVENOUS
  Administered 2017-02-28 – 2017-03-05 (×4): 100 ug via INTRAVENOUS
  Filled 2017-02-23 (×8): qty 2

## 2017-02-23 MED ORDER — FENTANYL CITRATE (PF) 100 MCG/2ML IJ SOLN: 100.0000 ug | INTRAMUSCULAR | Status: DC | PRN

## 2017-02-23 MED ORDER — VECURONIUM BROMIDE 10 MG IV SOLR
10.0000 mg | Freq: Once | INTRAVENOUS | Status: AC
  Administered 2017-02-23: 10 mg via INTRAVENOUS
  Filled 2017-02-23 (×2): qty 10

## 2017-02-23 MED ORDER — SODIUM CHLORIDE 0.9 % IV SOLN
0.0000 ug/min | INTRAVENOUS | Status: DC
  Administered 2017-02-23: 0 ug/min via INTRAVENOUS
  Administered 2017-02-24: 20 ug/min via INTRAVENOUS
  Administered 2017-02-24: 50 ug/min via INTRAVENOUS
  Filled 2017-02-23 (×2): qty 10

## 2017-02-23 MED ORDER — SODIUM CHLORIDE 0.9 % IV SOLN
0.0000 ug/kg/h | INTRAVENOUS | Status: DC
  Administered 2017-02-23: 1 ug/kg/h via INTRAVENOUS
  Administered 2017-02-23: 0 ug/kg/h via INTRAVENOUS
  Filled 2017-02-23 (×2): qty 2

## 2017-02-23 MED ORDER — FENTANYL CITRATE (PF) 100 MCG/2ML IJ SOLN
200.0000 ug | Freq: Once | INTRAMUSCULAR | Status: AC
  Administered 2017-02-23: 100 ug via INTRAVENOUS
  Filled 2017-02-23: qty 4

## 2017-02-23 MED ORDER — FENTANYL CITRATE (PF) 100 MCG/2ML IJ SOLN
INTRAMUSCULAR | Status: AC
  Administered 2017-02-23: 100 ug
  Filled 2017-02-23: qty 2

## 2017-02-23 MED ORDER — POTASSIUM CHLORIDE 10 MEQ/100ML IV SOLN
10.0000 meq | INTRAVENOUS | Status: AC
  Administered 2017-02-23 (×4): 10 meq via INTRAVENOUS
  Filled 2017-02-23 (×4): qty 100

## 2017-02-23 MED ORDER — MIDAZOLAM HCL 2 MG/2ML IJ SOLN
INTRAMUSCULAR | Status: AC
  Administered 2017-02-23: 2 mg
  Filled 2017-02-23: qty 2

## 2017-02-23 MED ORDER — PROPOFOL 1000 MG/100ML IV EMUL
0.0000 ug/kg/min | INTRAVENOUS | Status: DC
  Administered 2017-02-23: 10 ug/kg/min via INTRAVENOUS
  Filled 2017-02-23: qty 100

## 2017-02-23 MED ORDER — DEXMEDETOMIDINE HCL IN NACL 400 MCG/100ML IV SOLN
0.4000 ug/kg/h | INTRAVENOUS | Status: DC
  Administered 2017-02-23 – 2017-02-24 (×5): 1.2 ug/kg/h via INTRAVENOUS
  Filled 2017-02-23 (×11): qty 100

## 2017-02-23 MED ORDER — PANTOPRAZOLE SODIUM 40 MG PO PACK
40.0000 mg | PACK | Freq: Every day | ORAL | Status: DC
  Administered 2017-02-24 – 2017-03-21 (×26): 40 mg
  Filled 2017-02-23 (×26): qty 20

## 2017-02-23 MED ORDER — FUROSEMIDE 10 MG/ML IJ SOLN
80.0000 mg | Freq: Four times a day (QID) | INTRAMUSCULAR | Status: AC
  Administered 2017-02-23 (×3): 80 mg via INTRAVENOUS
  Filled 2017-02-23 (×4): qty 8

## 2017-02-23 MED ORDER — ETOMIDATE 2 MG/ML IV SOLN
40.0000 mg | Freq: Once | INTRAVENOUS | Status: AC
  Administered 2017-02-23: 30 mg via INTRAVENOUS
  Filled 2017-02-23: qty 20

## 2017-02-23 MED ORDER — FUROSEMIDE 10 MG/ML IJ SOLN
INTRAMUSCULAR | Status: AC
Start: 2017-02-23 — End: 2017-02-23
  Filled 2017-02-23: qty 4

## 2017-02-23 NOTE — Progress Notes (Signed)
Called by RN, patient is in respiratory failure.  On exam, increased WOB with RR of 40 and requiring HFNC.  This would be his third intubation.  I spoke with sister over the phone and girlfriend.  Will proceed with reintubation and tracheostomy.  After discussion, they are agreeable to tracheostomy.  Will proceed.  The patient is critically ill with multiple organ systems failure and requires high complexity decision making for assessment and support, frequent evaluation and titration of therapies, application of advanced monitoring technologies and extensive interpretation of multiple databases.   Critical Care Time devoted to patient care services described in this note is  35  Minutes. This time reflects time of care of this signee Dr [name]. This critical care time does not reflect procedure time, or teaching time or supervisory time of PA/NP/Med student/Med Resident etc but could involve care discussion time.  Rush Farmer, M.D. [facility] Pulmonary/Critical Care Medicine. Pager: [phone]. After hours pager: [phone].

## 2017-02-23 NOTE — Procedures (Signed)
Bedside Tracheostomy Insertion Procedure Note   Patient Details:   Name: [name] DOB: [birth year] MRN: [id number]  Procedure: Tracheostomy  Pre Procedure Assessment: ET Tube Size:7.5 ET Tube secured at lip (cm):24 Bite block in place: No Breath Sounds: Rhonch  Post Procedure Assessment: BP (!) 160/68   Pulse (!) 101   Temp (!) 100.7 F (38.2 C) (Oral) Comment: Melissa, RN notified  Resp (!) 32   Ht 5\' 11"  (1.803 m)   Wt (!) 356 lb 14.8 oz (161.9 kg)   SpO2 93%   BMI 49.78 kg/m  O2 sats: stable throughout Complications: No apparent complications Patient did tolerate procedure well Tracheostomy Brand:Shiley Tracheostomy Style:Cuffed Tracheostomy Size: 6 Tracheostomy Secured QNV:[id number] Tracheostomy Placement Confirmation:Trach cuff visualized and in place and Chest X ray ordered for placement    Ciro Backer 02/23/2017, 4:19 PM

## 2017-02-23 NOTE — Progress Notes (Signed)
PULMONARY / CRITICAL CARE MEDICINE   Name: [name] MRN: [id number] DOB: 12-26-1955    ADMISSION DATE:  02/16/2017 CONSULTATION DATE:  02/16/2017  REFERRING MD:  Regenia Skeeter  CHIEF COMPLAINT:  DYspnea  HISTORY OF PRESENT ILLNESS:   62y/o male with a history of lymphoma in remission, last treated in 2012 who was admitted with acute hypoxemic respiratory failure in the setting of influenza and likely pulmonary edema.     SUBJECTIVE: No distress on pressure support ventilation  VITAL SIGNS: BP (!) 140/120   Pulse 100   Temp 99 F (37.2 C) (Oral)   Resp (!) 27   Ht _0  (1.803 m)   Wt (!) 356 lb 14.8 oz (161.9 kg)   SpO2 90%   BMI 49.78 kg/m   HEMODYNAMICS:    VENTILATOR SETTINGS: Vent Mode: PRVC FiO2 (%):  [40 %-50 %] 40 % Set Rate:  [26 bmp-28 bmp] 26 bmp Vt Set:  [500 mL] 500 mL PEEP:  [10 cmH20] 10 cmH20 Plateau Pressure:  [18 cmH20-23 cmH20] 19 cmH20 Filed Weights   02/21/17 0500 02/22/17 0500 02/23/17 0500  Weight: (!) 378 lb 5 oz (171.6 kg) (!) 358 lb 4 oz (162.5 kg) (!) 356 lb 14.8 oz (161.9 kg)   INTAKE / OUTPUT:  Intake/Output Summary (Last 24 hours) at 02/23/2017 1024 Last data filed at 02/23/2017 1000 Gross per 24 hour  Intake 2717 ml  Output 6325 ml  Net -3608 ml    PHYSICAL EXAMINATION: General: 62 year old obese male currently resting comfortably on the bed no acute distress HEENT: Normocephalic atraumatic orally intubated. Pulmonary: Some rhonchi, excellent tidal volume, no accessory use. Cardiac: Regular irregular Abdomen: Soft nontender Extremities/musculoskeletal: Equal strength and bulk, chronic venous stasis changes, chronic lower extremity edema. Neuro/psych: Awake oriented, following commands appropriate.  LABS:  BMET Recent Labs  Lab 02/21/17 0423 02/22/17 0430 02/23/17 0445  NA 141 145 147*  K 3.8 3.6 3.5  CL 101 99* 98*  CO2 29 34* 36*  BUN 28* 31* 30*  CREATININE 0.87 0.86 0.78  GLUCOSE 137* 136* 156*     Electrolytes Recent Labs  Lab 02/21/17 0423 02/22/17 0430 02/23/17 0445  CALCIUM 7.7* 7.8* 8.5*  MG 1.4* 1.6* 1.7  PHOS 2.4* 3.3 2.4*    CBC Recent Labs  Lab 02/21/17 0423 02/22/17 0430 02/23/17 0445  WBC 6.2 8.7 13.5*  HGB 14.3 13.7 14.3  HCT 45.1 44.2 45.8  PLT 78* 96* 140*    Coag's Recent Labs  Lab 02/16/17 1710  INR 1.14    Sepsis Markers Recent Labs  Lab 02/16/17 1944  02/16/17 2300 02/17/17 0015  02/18/17 0519 02/22/17 1044 02/23/17 0445  LATICACIDVEN 2.00*  --  1.8 1.7  --   --   --   --   PROCALCITON  --    < >  --   --    < > 0.39 0.40 0.33   < > = values in this interval not displayed.    ABG Recent Labs  Lab 02/21/17 0355 02/22/17 0253 02/23/17 0329  PHART 7.419 7.425 7.476*  PCO2ART 53.0* 63.9* 65.2*  PO2ART 64.7* 69.0* 55.0*    Liver Enzymes Recent Labs  Lab 02/16/17 1710 02/17/17 0612  AST 115* 100*  ALT 66* 57  ALKPHOS 185* 158*  BILITOT 1.0 0.9  ALBUMIN 3.0* 2.6*    Cardiac Enzymes Recent Labs  Lab 02/16/17 2112 02/17/17 0612 02/17/17 0912  TROPONINI 0.05* 0.05* 0.06*    Glucose Recent Labs  Lab 02/22/17  1105 02/22/17 1524 02/22/17 1951 02/22/17 2341 02/23/17 0359 02/23/17 0743  GLUCAP 127* 108* 138* 147* 129* 119*    Imaging Dg Chest Port 1 View  Result Date: 02/23/2017 CLINICAL DATA:  Pneumonia. EXAM: PORTABLE CHEST 1 VIEW COMPARISON:  02/22/2017. FINDINGS: Unchanged cardiomegaly and BILATERAL pulmonary opacities. Support tubes and lines are stable. No pneumothorax. IMPRESSION: BILATERAL pulmonary opacities could represent a combination of pneumonia and edema. Overall stable appearance compared with priors. Electronically Signed   By: Staci Righter M.D.   On: 02/23/2017 06:46    STUDIES:  2/8 echo > EF 25-30% moderately dilated left ventricle, diffuse hypokinesis mild mitral valve regurgitation, left atrium mildly dilated  CULTURES: 2/7 BCx 2 >> negative 2/7 RVP >> influenza A 2/7 Sputum cx  >> normal flora 02/18/2017 BAL>> negative  ANTIBIOTICS: Levaquin 2/3 >2/5; 2/7 x 1 Vancomycin 2/7 >> 2/12 Azactam 2/7 >> 2/12 Tamiflu 2/8 >   SIGNIFICANT EVENTS: 2/7 Admit 02/18/2017 intubated for acute respiratory failure  LINES/TUBES: Left chest port >> accessed 2/7 02/18/2017 orotracheal tube>>2/14 02/18/2017 orogastric tube>>2/14   DISCUSSION: 62 y/o male with acute hypoxemic respiratory failure due to severe CAP, may have diastolic heart failure. Cx's negative, flu A positive VDRF 2/9 Aggressively diuresed, fully awake, passing spontaneous breathing trial, ready for extubation.  We will continue aggressive diuresis, continue evaluate chest x-ray, still need to consider whether or not he may need steroid trial if infiltrates continue to stay present in spite of diuresis however given clinical improvement will hold off on this for now.     ASSESSMENT / PLAN:  Acute respiratory failure with hypoxemia in setting of viral pneumonitis (Influenza A) c/b Acute pulmonary edema PCXR:persistent diffuse airspace disease, no sig improvement BUT vent mechanics better  Plan Extubate to high flow Continue aggressive diuretics N.p.o., advance as tolerated Out of bed We will hold off on steroid trial, however if bumps and chest x-ray does not clear further may consider this Dc fentanyl   Acute systolic HF w/ pulmonary edema  Atrial fib  Plan Cont heparin gtt Cont tele  Repeat lasix  Cont coreg Cont lipitor  Cont asa  Needs cards eval prior to dc    Intermittent Fluid and electrolyte imbalance: hypomagnesemia Plan kvo IVFs Replace and recheck as indicated   Mild thrombocytopenia-->improving History of non-hodgkins lymphoma Plan Trend cbc   Bilateral foot wounds Plan Per wound care   DM2   Plan Trend cbg  FAMILY  Reviewed status with sister at bedside 2/11 and 2/12  DVT prophylaxis: heparin gtt SUP: ppi Diet: tubefeeds Activity: BR Disposition : ICU  My critical  care times 40 minutes  Erick Colace ACNP-BC [place] Pager # [phone] OR # [phone] if no answer

## 2017-02-23 NOTE — Progress Notes (Signed)
ANTICOAGULATION CONSULT NOTE - Follow Up  Pharmacy Consult:  Heparin Indication: atrial fibrillation  Allergies  Allergen Reactions  . Penicillins Swelling    Age 62 or 31 - pt states he had swelling all over and his lips got real big   Patient Measurements: Height: 5\' 11"  (180.3 cm) Weight: (!) 356 lb 14.8 oz (161.9 kg) IBW/kg (Calculated) : 75.3 Heparin Dosing Weight: 117 kg  Vital Signs: Temp: 100.7 F (38.2 C) (02/14 1533) Temp Source: Oral (02/14 1533) BP: 135/102 (02/14 1800) Pulse Rate: 92 (02/14 1800)  Labs: Recent Labs    02/21/17 0423  02/21/17 1952 02/22/17 0430 02/23/17 0445  HGB 14.3  --   --  13.7 14.3  HCT 45.1  --   --  44.2 45.8  PLT 78*  --   --  96* 140*  HEPARINUNFRC  --    < > 0.38 0.42 0.39  CREATININE 0.87  --   --  0.86 0.78   < > = values in this interval not displayed.     Assessment: 62 yo M with new Afib started on IV heparin.  Heparin was held today for bedside trach procedure at 1430.  Per MD, ok to restart heparin at 1800 tonight.  Pt was previously therapeutic on 2050 units/hr.   Goal of Therapy:  Heparin level 0.3-0.7 units/ml Monitor platelets by anticoagulation protocol: Yes    Plan:  Restart heparin gtt at 2050 units/hr Daily heparin level and CBC  [name], Pharm.D., BCPS Clinical Pharmacist 02/23/2017 7:17 PM

## 2017-02-23 NOTE — Procedures (Signed)
Percutaneous Tracheostomy Placement  Consent from family.  Patient sedated, paralyzed and position.  Placed on 100% FiO2 and RR matched.  Area cleaned and draped.  Lidocaine/epi injected.  Skin incision done followed by blunt dissection.  Trachea palpated then punctured, catheter passed and visualized bronchoscopically.  Wire placed and visualized.  Catheter removed.  Airway then crushed and dilated.  Size 6 cuffed shiley trach placed and visualized bronchoscopically well above carina.  Good volume returns.  Patient tolerated the procedure well without complications.  Minimal blood loss.  CXR ordered and pending.  Rekita Miotke G. Timiya Howells, M.D.  Pulmonary/Critical Care Medicine. Pager: [phone]. After hours pager: [phone]. 

## 2017-02-23 NOTE — Procedures (Addendum)
Extubation Procedure Note  Patient Details:   Name: [name] DOB: Dec 14, 1955 MRN: [id number]   Airway Documentation:     Evaluation  O2 sats: stable throughout Complications: No apparent complications Patient did tolerate procedure well. Bilateral Breath Sounds: Diminished   Yes   Patient extubated to 50L HFNC at this time. Seems to be doing ok, but coughing up large amounts of secretions. NP aware. Will continue to monitor  Saunders [name] 02/23/2017, 11:37 AM

## 2017-02-23 NOTE — Progress Notes (Addendum)
Worsening respiratory distress s/p intubation Plan Re-intubate.  Full vent support  Pincus Badder ACNP-BC [place] Pager # [phone] OR # [phone] if no answer

## 2017-02-23 NOTE — Progress Notes (Signed)
ANTICOAGULATION CONSULT NOTE  Pharmacy Consult:  Heparin Indication: atrial fibrillation  Allergies  Allergen Reactions  . Penicillins Swelling    Age 62 or 14 - pt states he had swelling all over and his lips got real big he had swelling all over and his lips got real big   Patient Measurements: Height: 5\' 11"  (180.3 cm) Weight: (!) 356 lb 14.8 oz (161.9 kg) IBW/kg (Calculated) : 75.3 Heparin Dosing Weight: 117 kg  Vital Signs: Temp: 98.1 F (36.7 C) (02/14 0429) Temp Source: Oral (02/14 0429) BP: 144/99 (02/14 0600) Pulse Rate: 87 (02/14 0600)  Labs: Recent Labs    02/21/17 0423  02/21/17 1952 02/22/17 0430 02/23/17 0445  HGB 14.3  --   --  13.7 14.3  HCT 45.1  --   --  44.2 45.8  PLT 78*  --   --  96* 140*  HEPARINUNFRC  --    < > 0.38 0.42 0.39  CREATININE 0.87  --   --  0.86 0.78   < > = values in this interval not displayed.     Assessment: 62 [name] with new Afib started on IV heparin.  Heparin level this AM remains therapeutic at 0.39 on 2050 units/hr. CBC and platelets are stable and no signs of bleeding have been noted.    Goal of Therapy:  Heparin level 0.3-0.7 units/ml Monitor platelets by anticoagulation protocol: Yes    Plan:  Continue heparin gtt at 2050 units/hr Daily heparin level and CBC   Jimmy Footman, PharmD, BCPS PGY2 Infectious Diseases Pharmacy Resident Pager: [phone]  02/23/2017, 7:10 AM

## 2017-02-23 NOTE — Procedures (Signed)
Intubation Procedure Note YORDIN RHODA [id number] 05-Apr-1955  Procedure: Intubation Indications: Respiratory insufficiency  Procedure Details Consent: Risks of procedure as well as the alternatives and risks of each were explained to the (patient/caregiver).  Consent for procedure obtained. Time Out: Verified patient identification, verified procedure, site/side was marked, verified correct patient position, special equipment/implants available, medications/allergies/relevent history reviewed, required imaging and test results available.  Performed  Maximum sterile technique was used including antiseptics, cap, gloves, gown, hand hygiene and mask.  MAC  Evaluation Hemodynamic Status: BP stable throughout; O2 sats: stable throughout Patient's Current Condition: stable Complications: No apparent complications Patient did tolerate procedure well. Chest X-ray ordered to verify placement.  CXR: pending.  Erick Colace ACNP-BC [place] Pager # [phone] OR # [phone] if no answer  I was present and supervised the procedure.  Rush Farmer, M.D. [facility] Pulmonary/Critical Care Medicine. Pager: [phone]. After hours pager: [phone].

## 2017-02-23 NOTE — Procedures (Signed)
Bronchoscopy Procedure Note [name] [id number] Apr 02, 1955  Procedure: Bronchoscopy Indications: tracheostomy placement   Procedure Details Consent: Risks of procedure as well as the alternatives and risks of each were explained to the (patient/caregiver).  Consent for procedure obtained. Time Out: Verified patient identification, verified procedure, site/side was marked, verified correct patient position, special equipment/implants available, medications/allergies/relevent history reviewed, required imaging and test results available.  Performed  In preparation for procedure, patient was given 100% FiO2 and bronchoscope lubricated. Sedation: Muscle relaxants, Etomidate, fentanyl, diprivan and vecuronium  Airway entered and the following bronchi were examined: Bronchi.    Procedure done by P Vivica Dobosz ACNP-BC, under direct supervision of Dr Lamonte Sakai. At first bronch was introduce through ET tube and structures of tracheal rings, carina identified for operator of tracheostomy who was Dr Nelda Marseille. Light of bronch passed through trachea and skin for indentification of tracheal rings for tracheostomy puncture. After this, under bronchoscopy guidance,  ET tube was pulled back sufficiently and very carefully. The ET tube was  pulled back enough to give room for tracheostomy operator and yet at same time to to ensure a secured airway. After this was accomplished, bronchoscope was withdrawn into the ET tube. After this,  Dr Nelda Marseille  then performed tracheostomy under video visual provided by flexible video bronchoscopy. Followng introduction of tracheostomy,  the bronchoscope was removed from ET tube and introduced through tracheostomy. Correct position of tracheostomy was ensured, with enough room between carina and distal tracheostomy and no evidence of bleeding. The bronchoscope was then withdrawn. Respiratory therapist was then instructed to remove the ET tube.  Dr Nelda Marseille  then proceeded to complete the  tracheostomy with stay sutures   No complications    [name] 02/23/2017 [name] ACNP-BC [place] Pager # [phone] OR # [phone] if no answer

## 2017-02-24 ENCOUNTER — Inpatient Hospital Stay (HOSPITAL_COMMUNITY): Payer: BLUE CROSS/BLUE SHIELD

## 2017-02-24 LAB — CBC
HCT: 48.7 % (ref 39.0–52.0)
HCT: 44.7 % (ref 39.0–52.0)
Hemoglobin: 15.6 g/dL (ref 13.0–17.0)
Hemoglobin: 14.2 g/dL (ref 13.0–17.0)
MCH: 30.5 pg (ref 26.0–34.0)
MCH: 30.3 pg (ref 26.0–34.0)
MCHC: 32 g/dL (ref 30.0–36.0)
MCHC: 31.8 g/dL (ref 30.0–36.0)
MCV: 95.3 fL (ref 78.0–100.0)
MCV: 95.3 fL (ref 78.0–100.0)
PLATELETS: 179 10*3/uL (ref 150–400)
Platelets: 172 10*3/uL (ref 150–400)
RBC: 5.11 MIL/uL (ref 4.22–5.81)
RBC: 4.69 MIL/uL (ref 4.22–5.81)
RDW: 16.5 % — AB (ref 11.5–15.5)
RDW: 16.9 % — ABNORMAL HIGH (ref 11.5–15.5)
WBC: 12.6 10*3/uL — AB (ref 4.0–10.5)
WBC: 10.3 10*3/uL (ref 4.0–10.5)

## 2017-02-24 LAB — BLOOD GAS, ARTERIAL
Acid-Base Excess: 17.1 mmol/L — ABNORMAL HIGH (ref 0.0–2.0)
Acid-Base Excess: 13.9 mmol/L — ABNORMAL HIGH (ref 0.0–2.0)
BICARBONATE: 41.2 mmol/L — AB (ref 20.0–28.0)
Bicarbonate: 37.2 mmol/L — ABNORMAL HIGH (ref 20.0–28.0)
Drawn by: 41977
Drawn by: 406621
FIO2: 60
FIO2: 70
MECHVT: 500 mL
O2 Saturation: 94.1 %
O2 Saturation: 93 %
PEEP: 8 cmH2O
PEEP: 8 cmH2O
Patient temperature: 98.6
Patient temperature: 98.6
RATE: 16 resp/min
RATE: 28 resp/min
VT: 500 mL
pCO2 arterial: 45.2 mmHg (ref 32.0–48.0)
pCO2 arterial: 37.6 mmHg (ref 32.0–48.0)
pH, Arterial: 7.567 — ABNORMAL HIGH (ref 7.350–7.450)
pH, Arterial: 7.601 (ref 7.350–7.450)
pO2, Arterial: 65.9 mmHg — ABNORMAL LOW (ref 83.0–108.0)
pO2, Arterial: 59.3 mmHg — ABNORMAL LOW (ref 83.0–108.0)

## 2017-02-24 LAB — GLUCOSE, CAPILLARY
GLUCOSE-CAPILLARY: 126 mg/dL — AB (ref 65–99)
GLUCOSE-CAPILLARY: 132 mg/dL — AB (ref 65–99)
GLUCOSE-CAPILLARY: 133 mg/dL — AB (ref 65–99)
GLUCOSE-CAPILLARY: 153 mg/dL — AB (ref 65–99)
GLUCOSE-CAPILLARY: 169 mg/dL — AB (ref 65–99)
Glucose-Capillary: 163 mg/dL — ABNORMAL HIGH (ref 65–99)
Glucose-Capillary: 157 mg/dL — ABNORMAL HIGH (ref 65–99)

## 2017-02-24 LAB — BASIC METABOLIC PANEL
ANION GAP: 16 — AB (ref 5–15)
BUN: 30 mg/dL — ABNORMAL HIGH (ref 6–20)
CO2: 33 mmol/L — ABNORMAL HIGH (ref 22–32)
Calcium: 8.8 mg/dL — ABNORMAL LOW (ref 8.9–10.3)
Chloride: 98 mmol/L — ABNORMAL LOW (ref 101–111)
Creatinine, Ser: 0.89 mg/dL (ref 0.61–1.24)
GFR calc Af Amer: >60 mL/min (ref >60)
Glucose, Bld: 132 mg/dL — ABNORMAL HIGH (ref 65–99)
POTASSIUM: 3.6 mmol/L (ref 3.5–5.1)
SODIUM: 147 mmol/L — AB (ref 135–145)

## 2017-02-24 LAB — PROCALCITONIN: PROCALCITONIN: 0.51 ng/mL

## 2017-02-24 LAB — COOXEMETRY PANEL
Carboxyhemoglobin: 1.8 % — ABNORMAL HIGH (ref 0.5–1.5)
Methemoglobin: 0.6 % (ref 0.0–1.5)
O2 Saturation: 67.2 %
Total hemoglobin: 16.1 g/dL — ABNORMAL HIGH (ref 12.0–16.0)

## 2017-02-24 LAB — HEPARIN LEVEL (UNFRACTIONATED)
Heparin Unfractionated: 0.22 IU/mL — ABNORMAL LOW (ref 0.30–0.70)
Heparin Unfractionated: 0.29 IU/mL — ABNORMAL LOW (ref 0.30–0.70)

## 2017-02-24 LAB — MAGNESIUM: Magnesium: 1.5 mg/dL — ABNORMAL LOW (ref 1.7–2.4)

## 2017-02-24 LAB — PHOSPHORUS: PHOSPHORUS: 3.5 mg/dL (ref 2.5–4.6)

## 2017-02-24 MED ORDER — MAGNESIUM SULFATE 4 GM/100ML IV SOLN
4.0000 g | Freq: Once | INTRAVENOUS | Status: AC
  Administered 2017-02-24: 4 g via INTRAVENOUS
  Filled 2017-02-24: qty 100

## 2017-02-24 MED ORDER — NON FORMULARY: 5.0000 mg | Freq: Every day | Status: DC

## 2017-02-24 MED ORDER — SODIUM CHLORIDE 0.9% FLUSH
10.0000 mL | Freq: Two times a day (BID) | INTRAVENOUS | Status: DC
  Administered 2017-02-24 – 2017-03-01 (×8): 10 mL
  Administered 2017-03-02: 30 mL
  Administered 2017-03-02 – 2017-03-05 (×7): 10 mL
  Administered 2017-03-06: 30 mL
  Administered 2017-03-07 (×2): 10 mL
  Administered 2017-03-08: 20 mL
  Administered 2017-03-08: 10 mL
  Administered 2017-03-09: 20 mL
  Administered 2017-03-10 – 2017-03-11 (×3): 10 mL
  Administered 2017-03-11: 30 mL
  Administered 2017-03-12: 10 mL
  Administered 2017-03-12: 30 mL
  Administered 2017-03-13 – 2017-03-21 (×17): 10 mL
  Administered 2017-03-21: 30 mL
  Administered 2017-03-22: 10 mL

## 2017-02-24 MED ORDER — PROPOFOL 1000 MG/100ML IV EMUL
0.0000 ug/kg/min | INTRAVENOUS | Status: DC
  Administered 2017-02-24: 30 ug/kg/min via INTRAVENOUS
  Administered 2017-02-24: 5 ug/kg/min via INTRAVENOUS
  Administered 2017-02-25: 20 ug/kg/min via INTRAVENOUS
  Administered 2017-02-25 (×2): 30 ug/kg/min via INTRAVENOUS
  Administered 2017-02-25 – 2017-02-26 (×3): 20 ug/kg/min via INTRAVENOUS
  Administered 2017-02-26: 15 ug/kg/min via INTRAVENOUS
  Administered 2017-02-26 (×2): 20 ug/kg/min via INTRAVENOUS
  Administered 2017-02-27: 30 ug/kg/min via INTRAVENOUS
  Administered 2017-02-27: 20 ug/kg/min via INTRAVENOUS
  Administered 2017-02-27: 50 ug/kg/min via INTRAVENOUS
  Administered 2017-02-27: 30 ug/kg/min via INTRAVENOUS
  Administered 2017-02-27 (×3): 20 ug/kg/min via INTRAVENOUS
  Administered 2017-02-28: 30 ug/kg/min via INTRAVENOUS
  Administered 2017-02-28 (×2): 10 ug/kg/min via INTRAVENOUS
  Administered 2017-02-28: 30 ug/kg/min via INTRAVENOUS
  Administered 2017-03-01: 20 ug/kg/min via INTRAVENOUS
  Administered 2017-03-01: 30 ug/kg/min via INTRAVENOUS
  Filled 2017-02-24 (×27): qty 100

## 2017-02-24 MED ORDER — PRO-STAT SUGAR FREE PO LIQD
60.0000 mL | Freq: Three times a day (TID) | ORAL | Status: DC
  Administered 2017-02-24: 30 mL
  Administered 2017-02-24 – 2017-03-15 (×56): 60 mL
  Filled 2017-02-24 (×63): qty 60

## 2017-02-24 MED ORDER — LACTATED RINGERS IV BOLUS (SEPSIS)
250.0000 mL | Freq: Once | INTRAVENOUS | Status: AC
Start: 2017-02-24 — End: 2017-02-24
  Administered 2017-02-24: 250 mL via INTRAVENOUS

## 2017-02-24 MED ORDER — FREE WATER
200.0000 mL | Status: DC
  Administered 2017-02-24 – 2017-02-26 (×12): 200 mL

## 2017-02-24 MED ORDER — FUROSEMIDE 10 MG/ML IJ SOLN
80.0000 mg | Freq: Four times a day (QID) | INTRAMUSCULAR | Status: DC
Start: 2017-02-24 — End: 2017-02-24
  Administered 2017-02-24: 80 mg via INTRAVENOUS
  Filled 2017-02-24 (×4): qty 8

## 2017-02-24 MED ORDER — CHLORHEXIDINE GLUCONATE CLOTH 2 % EX PADS
6.0000 | MEDICATED_PAD | Freq: Every day | CUTANEOUS | Status: DC
  Administered 2017-02-25 – 2017-03-08 (×12): 6 via TOPICAL

## 2017-02-24 MED ORDER — SODIUM CHLORIDE 0.9 % IV SOLN
0.0000 ug/min | INTRAVENOUS | Status: DC
  Administered 2017-02-24: 120 ug/min via INTRAVENOUS
  Administered 2017-02-25: 180 ug/min via INTRAVENOUS
  Administered 2017-02-25: 200 ug/min via INTRAVENOUS
  Administered 2017-02-25: 240 ug/min via INTRAVENOUS
  Administered 2017-02-25 (×3): 220 ug/min via INTRAVENOUS
  Administered 2017-02-26: 240 ug/min via INTRAVENOUS
  Administered 2017-02-26: 220 ug/min via INTRAVENOUS
  Administered 2017-02-26: 180 ug/min via INTRAVENOUS
  Administered 2017-02-26: 150 ug/min via INTRAVENOUS
  Administered 2017-02-26: 190 ug/min via INTRAVENOUS
  Administered 2017-02-27: 50 ug/min via INTRAVENOUS
  Administered 2017-02-27: 40 ug/min via INTRAVENOUS
  Administered 2017-02-27: 140 ug/min via INTRAVENOUS
  Filled 2017-02-24 (×3): qty 4
  Filled 2017-02-24 (×2): qty 40
  Filled 2017-02-24 (×7): qty 4

## 2017-02-24 MED ORDER — MILRINONE LACTATE IN DEXTROSE 20-5 MG/100ML-% IV SOLN
0.2500 ug/kg/min | INTRAVENOUS | Status: DC
  Administered 2017-02-24 – 2017-02-25 (×3): 0.25 ug/kg/min via INTRAVENOUS
  Filled 2017-02-24 (×4): qty 100

## 2017-02-24 MED ORDER — VITAL HIGH PROTEIN PO LIQD
1000.0000 mL | ORAL | Status: DC
  Administered 2017-02-24 – 2017-03-13 (×15): 1000 mL
  Filled 2017-02-24 (×14): qty 1000

## 2017-02-24 MED ORDER — SODIUM CHLORIDE 0.9% FLUSH: 10.0000 mL | INTRAVENOUS | Status: DC | PRN

## 2017-02-24 MED ORDER — SODIUM CHLORIDE 0.9 % IV SOLN: INTRAVENOUS | Status: DC | PRN

## 2017-02-24 MED ORDER — POTASSIUM CHLORIDE 20 MEQ/15ML (10%) PO SOLN
40.0000 meq | Freq: Once | ORAL | Status: AC
  Administered 2017-02-24: 40 meq
  Filled 2017-02-24: qty 30

## 2017-02-24 MED ORDER — MELATONIN 3 MG PO TABS
6.0000 mg | ORAL_TABLET | Freq: Every day | ORAL | Status: DC
  Administered 2017-02-24 – 2017-03-07 (×11): 6 mg via ORAL
  Filled 2017-02-24 (×12): qty 2

## 2017-02-24 MED ORDER — FLEET ENEMA 7-19 GM/118ML RE ENEM
1.0000 | ENEMA | Freq: Once | RECTAL | Status: DC
  Filled 2017-02-24 (×2): qty 1

## 2017-02-24 NOTE — Progress Notes (Signed)
ANTICOAGULATION CONSULT NOTE - Follow Up  Pharmacy Consult:  Heparin Indication: atrial fibrillation  Allergies  Allergen Reactions  . Penicillins Swelling    Age 62 or 36 - pt states he had swelling all over and his lips got real big   Patient Measurements: Height: 5\' 11"  (180.3 cm) Weight: (!) 336 lb 13.8 oz (152.8 kg) IBW/kg (Calculated) : 75.3 Heparin Dosing Weight: 117 kg  Vital Signs: Temp: 98.4 F (36.9 C) (02/15 0350) Temp Source: Oral (02/15 0350) BP: 143/110 (02/15 0600) Pulse Rate: 89 (02/15 0700)  Labs: Recent Labs    02/22/17 0430 02/23/17 0445 02/24/17 0507  HGB 13.7 14.3 15.6  HCT 44.2 45.8 48.7  PLT 96* 140* 179  HEPARINUNFRC 0.42 0.39 0.22*  CREATININE 0.86 0.78 0.89     Assessment: 62 yo M with new Afib started on IV heparin. Heparin was held yesterday for bedside trach and was re-started around 1813 last PM.  Heparin level this morning is slightly low at 0.22 after re-start despite previously being therapeutic on 2050 units/hr. CBC is stable and no signs of bleeding have been noted.   Goal of Therapy:  Heparin level 0.3-0.7 units/ml Monitor platelets by anticoagulation protocol: Yes    Plan:  Increase heparin to 2200 units/hr  6 hour heparin level  Daily heparin level and CBC Monitor for signs/symptoms of bleeding   Jimmy Footman, PharmD, BCPS PGY2 Infectious Diseases Pharmacy Resident Pager: [phone]  02/24/2017 7:57 AM

## 2017-02-24 NOTE — Evaluation (Signed)
Physical Therapy Evaluation Patient Details Name: [name] MRN: [id number] DOB: Jun 17, 1955 Today's Date: 02/24/2017   History of Present Illness  Pt is a 62 y/o male history of lymphoma in remission admitted with acute on chronic hypoxemic and hypercapnic respiratory failure due to influenza A.  He was also noted to have a new LVEF of 25% with contribution of cardiogenic edema.  He failed extubation on 2/14.  Based on his need for continued ventilatory support he underwent percutaneous tracheostomy on 2/14.    Clinical Impression  Pt presented supine in bed with HOB elevated, lethargic and unresponsive with RN present in room. Pt did not response to noxious stimuli and was total A for bed mobility and any movement of extremities. Initial plan was to attempt to get pt OOB this AM; however, per RN's request bed level eval only as pt's SPO2 maintaining at 87-88% on full vent support. Pt would continue to benefit from skilled physical therapy services at this time while admitted and after d/c to address the below listed limitations in order to improve overall safety and independence with functional mobility.     Follow Up Recommendations LTACH;Other (comment)(versus SNF based on vent needs?)    Equipment Recommendations  None recommended by PT    Recommendations for Other Services       Precautions / Restrictions Precautions Precautions: Fall Precaution Comments: trach, vent Restrictions Weight Bearing Restrictions: No      Mobility  Bed Mobility Overal bed mobility: Needs Assistance             General bed mobility comments: TOTAL A for all  Transfers                    Ambulation/Gait                Stairs            Wheelchair Mobility    Modified Rankin (Stroke Patients Only)       Balance                                             Pertinent Vitals/Pain Pain Assessment: Faces Faces Pain Scale: No hurt    Home  Living Family/patient expects to be discharged to:: Unsure                 Additional Comments: pt on vent and unresponsive throughout    Prior Function           Comments: uncertain, pt unresponsive with trach and on vent - per chart review of case management's note, pt previously independent and lives alone     Hand Dominance        Extremity/Trunk Assessment   Upper Extremity Assessment Upper Extremity Assessment: RUE deficits/detail;LUE deficits/detail RUE Deficits / Details: no active movements throughout; PROM WNL LUE Deficits / Details: no active movements throughout; PROM WNL    Lower Extremity Assessment Lower Extremity Assessment: RLE deficits/detail;LLE deficits/detail RLE Deficits / Details: pt with random spontaneous movements through partial ROM for hip abduction/adduction; no response to noxious stimuli LLE Deficits / Details: pt with random spontaneous movements through partial ROM for hip abduction/adduction; no response to noxious stimuli       Communication   Communication: Tracheostomy  Cognition Arousal/Alertness: Lethargic Behavior During Therapy: Flat affect Overall Cognitive Status: Difficult to assess  General Comments      Exercises     Assessment/Plan    PT Assessment Patient needs continued PT services  PT Problem List Decreased strength;Decreased activity tolerance;Decreased balance;Decreased coordination;Decreased mobility;Decreased cognition;Decreased knowledge of use of DME;Decreased safety awareness;Decreased knowledge of precautions;Cardiopulmonary status limiting activity       PT Treatment Interventions Functional mobility training;Therapeutic activities;Therapeutic exercise;Balance training;Neuromuscular re-education;Cognitive remediation;Patient/family education    PT Goals (Current goals can be found in the Care Plan section)  Acute Rehab PT Goals Patient Stated  Goal: unable to state (trach/vent) PT Goal Formulation: Patient unable to participate in goal setting Time For Goal Achievement: 03/10/17 Potential to Achieve Goals: Fair    Frequency Min 2X/week   Barriers to discharge        Co-evaluation               AM-PAC PT "6 Clicks" Daily Activity  Outcome Measure Difficulty turning over in bed (including adjusting bedclothes, sheets and blankets)?: Unable Difficulty moving from lying on back to sitting on the side of the bed? : Unable Difficulty sitting down on and standing up from a chair with arms (e.g., wheelchair, bedside commode, etc,.)?: Unable Help needed moving to and from a bed to chair (including a wheelchair)?: Total Help needed walking in hospital room?: Total Help needed climbing 3-5 steps with a railing? : Total 6 Click Score: 6    End of Session Equipment Utilized During Treatment: Other (comment)(vent) Activity Tolerance: Patient limited by fatigue;Treatment limited secondary to medical complications (Comment);Other (comment)(SPO2 maintaining at 87-88% even on full vent support) Patient left: in bed;with call bell/phone within reach;with nursing/sitter in room;with family/visitor present Nurse Communication: Mobility status PT Visit Diagnosis: Other abnormalities of gait and mobility (R26.89)    Time: 5885-0277 PT Time Calculation (min) (ACUTE ONLY): 12 min   Charges:   PT Evaluation $PT Eval Moderate Complexity: 1 Mod     PT G Codes:        [place], PT, DPT [place] 02/24/2017, 4:32 PM

## 2017-02-24 NOTE — Progress Notes (Signed)
Cortrak Tube Team Note:  Consult received to place a Cortrak feeding tube.   A 10 F Cortrak tube was placed in the R nare and secured with a nasal bridle at 89 cm. Per the Cortrak monitor reading the tube tip is post pyloric.   No x-ray is required. RN may begin using tube.   If the tube becomes dislodged please keep the tube and contact the Cortrak team at [email or web address] (password [email or web address]) for replacement.  If after hours and replacement cannot be delayed, place a NG tube and confirm placement with an abdominal x-ray.    Gaynell Face, MS, RD, LDN Pager: [phone] Weekend/After Hours: [phone]

## 2017-02-24 NOTE — Progress Notes (Signed)
PULMONARY / CRITICAL CARE MEDICINE   Name: [name] MRN: [id number] DOB: 1955-05-30    ADMISSION DATE:  02/16/2017 CONSULTATION DATE:  02/16/2017  REFERRING MD:  Regenia Skeeter  CHIEF COMPLAINT:  DYspnea  HISTORY OF PRESENT ILLNESS:   62y/o male with a history of lymphoma in remission, last treated in 2012 who was admitted with acute hypoxemic respiratory failure in the setting of influenza and likely pulmonary edema.     SUBJECTIVE: Tracheostomy placed on 2/14 on a little higher FiO2 and PEEP support  VITAL SIGNS: BP 120/84   Pulse 90   Temp (!) 100.4 F (38 C) (Oral)   Resp (!) 24   Ht _0  (1.803 m)   Wt (!) 336 lb 13.8 oz (152.8 kg)   SpO2 97%   BMI 46.98 kg/m   HEMODYNAMICS:    VENTILATOR SETTINGS: Vent Mode: PRVC FiO2 (%):  [60 %-100 %] 60 % Set Rate:  [16 bmp-28 bmp] 16 bmp Vt Set:  [500 mL] 500 mL PEEP:  [5 cmH20-8 cmH20] 8 cmH20 Plateau Pressure:  [17 cmH20-24 cmH20] 22 cmH20 Filed Weights   02/22/17 0500 02/23/17 0500 02/24/17 0500  Weight: (!) 358 lb 4 oz (162.5 kg) (!) 356 lb 14.8 oz (161.9 kg) (!) 336 lb 13.8 oz (152.8 kg)   INTAKE / OUTPUT:  Intake/Output Summary (Last 24 hours) at 02/24/2017 0910 Last data filed at 02/24/2017 0745 Gross per 24 hour  Intake 1172.45 ml  Output 3800 ml  Net -2627.55 ml    PHYSICAL EXAMINATION: General: This is a massively obese 62 year old white male currently sedated on ventilator HEENT: Normocephalic atraumatic.  He now has a #6 cuffed tracheostomy in place trach stoma is unremarkable Pulmonary: Equal chest rise on ventilator diminished throughout Cardiac: Regular irregular with atrial fibrillation on telemetry no murmur rub or gallop Abdomen: Obese, soft, no organomegaly, positive bowel sounds. GU: Clear yellow urine Extremities/musculoskeletal: Equal strength and bulk.  He has chronic venous stasis changes bilateral lower extremities with improving level of edema Neuro/psych: Arouses to voice, moves all  extremities, will follow commands intermittently.  He is more sedated today than when compared to prior exams  LABS:  BMET Recent Labs  Lab 02/22/17 0430 02/23/17 0445 02/24/17 0507  NA 145 147* 147*  K 3.6 3.5 3.6  CL 99* 98* 98*  CO2 34* 36* 33*  BUN 31* 30* 30*  CREATININE 0.86 0.78 0.89  GLUCOSE 136* 156* 132*    Electrolytes Recent Labs  Lab 02/22/17 0430 02/23/17 0445 02/24/17 0507  CALCIUM 7.8* 8.5* 8.8*  MG 1.6* 1.7 1.5*  PHOS 3.3 2.4* 3.5    CBC Recent Labs  Lab 02/22/17 0430 02/23/17 0445 02/24/17 0507  WBC 8.7 13.5* 12.6*  HGB 13.7 14.3 15.6  HCT 44.2 45.8 48.7  PLT 96* 140* 179    Coag's No results for input(s): APTT, INR in the last 168 hours.  Sepsis Markers Recent Labs  Lab 02/22/17 1044 02/23/17 0445 02/24/17 0507  PROCALCITON 0.40 0.33 0.51    ABG Recent Labs  Lab 02/22/17 0253 02/23/17 0329 02/24/17 0412  PHART 7.425 7.476* 7.567*  PCO2ART 63.9* 65.2* 45.2  PO2ART 69.0* 55.0* 65.9*    Liver Enzymes No results for input(s): AST, ALT, ALKPHOS, BILITOT, ALBUMIN in the last 168 hours.  Cardiac Enzymes Recent Labs  Lab 02/17/17 0912  TROPONINI 0.06*    Glucose Recent Labs  Lab 02/23/17 1149 02/23/17 1521 02/23/17 2020 02/24/17 0029 02/24/17 0349 02/24/17 0814  GLUCAP 135* 115* 126* 133*  132* 126*    Imaging Dg Chest Port 1 View  Result Date: 02/23/2017 CLINICAL DATA:  Trach placement EXAM: PORTABLE CHEST 1 VIEW COMPARISON:  Earlier the same day. FINDINGS: 1615 hours. Endotracheal tube has been removed in the interval and tracheostomy tube now visible. Tip of tracheostomy tube is about 6.5 cm above the base of the carina. The left Port-A-Cath remains in place with tip overlying the distal SVC. Low lung volumes with bibasilar airspace disease, similar to prior. The visualized bony structures of the thorax are intact. Telemetry leads overlie the chest. IMPRESSION: 1. Tracheostomy tube tip projects about 6.5 cm above  the carina. 2. Stable appearance patchy mid and lower lobe airspace opacity. Electronically Signed   By: Misty Stanley M.D.   On: 02/23/2017 16:29    STUDIES:  2/8 echo > EF 25-30% moderately dilated left ventricle, diffuse hypokinesis mild mitral valve regurgitation, left atrium mildly dilated  CULTURES: 2/7 BCx 2 >> negative 2/7 RVP >> influenza A 2/7 Sputum cx >> normal flora 02/18/2017 BAL>> negative  ANTIBIOTICS: Levaquin 2/3 >2/5; 2/7 x 1 Vancomycin 2/7 >> 2/12 Azactam 2/7 >> 2/12 Tamiflu 2/8 > completed   SIGNIFICANT EVENTS: 2/7 Admit 02/18/2017 intubated for acute respiratory failure  LINES/TUBES: Left chest port >> accessed 2/7 02/18/2017 orotracheal tube>>2/14 02/18/2017 orogastric tube>>2/14 Trach 2/14>>>  DISCUSSION: Mr. Sciuto remains ventilator and tracheostomy dependent in the setting of influenza A pneumonitis which is significantly complicated by acute systolic heart failure with element of pulmonary edema.  I am worried that his new cardiomyopathy has been a significant factor in his failure to stay extubated.  His cardiac index by noninvasive monitoring is less than 2, and he still appears to have significant degree of pulmonary edema.  For today the plan will be to augment his cardiac output will trial milrinone, hopefully this will not exacerbate his atrial fibrillation.  We will also continue aggressive diuresis, and continue aggressive weaning of PEEP/FiO2.  We also need to continue to focus on delirium treatment.  This includes getting him out of bed today, Precedex infusion with RASS goal of 0, and encouraging normal sleep-wake cycle.  I will add melatonin to his regimen as well tonight   ASSESSMENT / PLAN:  Acute respiratory failure with hypoxemia in setting of viral pneumonitis (Influenza A) c/b Acute pulmonary edema, failed extubation on 2/14 now trach dependent  Respiratory alkalosis  PCXR: Portable chest x-ray personally reviewed 2/14 this demonstrates  bilateral pulmonary infiltrates much improvement in comparison to prior films.  Tracheostomy is in satisfactory position. -Remains on full ventilator support, working on weaning FiO2 and PEEP again after reintubation on 2/14, I suspect he be retreated some during tracheostomy and extubation trial Plan We will continue to wean FiO2 and PEEP, hopefully transition to pressure support trials soon, if not later today Eventually goal is ATC  Aggressive diuresis aiming for negative fluid balance Repeat abg in am  PAD protocol with RASS goal of 0 Mobilize, out of bed daily Continue routine trach care   Acute systolic HF w/ pulmonary edema (EF 25%) Atrial fib  I think that his CM may have played a large role in his failed extubation CI is 1.3 Plan No initiate milrinone drip, goal cardiac index greater than 2 Continue heparin Continue telemetry Continue Coreg Continue Lipitor and aspirin morning Will eventually need cardiology evaluation  Intermittent Fluid and electrolyte imbalance: hypomagnesemia & hypernatremia  Plan KVO IVF Add free water Replace and recheck as indicated   Mild thrombocytopenia-->improving History  of non-hodgkins lymphoma Plan Trend cbc  Bilateral foot wounds Plan Wound care   DM2   Plan Trend cbg  FAMILY  Reviewed status with sister at bedside 2/11 and 2/12  DVT prophylaxis: heparin gtt SUP: ppi Diet: tubefeeds Activity: BR Disposition : ICU  My cct 35 minutes   Erick Colace ACNP-BC [place] Pager # [phone] OR # [phone] if no answer

## 2017-02-24 NOTE — Procedures (Signed)
Arterial Catheter Insertion Procedure Note [name] [id number] Nov 21, 1955  Procedure: Insertion of Arterial Catheter  Indications: Blood pressure monitoring  Procedure Details Consent: Risks of procedure as well as the alternatives and risks of each were explained to the (patient/caregiver).  Consent for procedure obtained. Time Out: Verified patient identification, verified procedure, site/side was marked, verified correct patient position, special equipment/implants available, medications/allergies/relevent history reviewed, required imaging and test results available.  Performed  Maximum sterile technique was used including antiseptics, cap, gloves, gown, hand hygiene, mask and sheet. Skin prep: Chlorhexidine; local anesthetic administered 20 gauge catheter was inserted into right radial artery using the Seldinger technique.  Evaluation Blood flow good; BP tracing good. Complications: No apparent complications.  A-line inserted per orders and placed on flow track [name] 02/24/2017

## 2017-02-24 NOTE — Progress Notes (Signed)
Nutrition Follow-up  DOCUMENTATION CODES:   Morbid obesity  INTERVENTION:   Resume TF:  Vital High Protein at 50 ml/h (1200 ml per day)  Pro-stat 60 ml TID  Provides 1800 kcal, 195 gm protein, 1003 ml free water daily  NUTRITION DIAGNOSIS:   Inadequate oral intake related to inability to eat as evidenced by NPO status.  Ongoing  GOAL:   Provide needs based on ASPEN/SCCM guidelines  TF being resumed today  MONITOR:   Vent status, TF tolerance, Labs, I & O's  ASSESSMENT:   62 yo male with PMH of HTN, venous insufficiency, HLD, gout, NHL (in remission, last treated in 2012) who was admitted on 2/7 with acute hypoxemia respiratory failure, influenza A, and pulmonary edema. Required intubation on 2/9.  Discussed patient in ICU rounds and with RN today. Patient was extubated, but required re-intubation and tracheostomy on 2/14. TF have been on hold. RD to resume TF orders today. Patient remains on ventilator support Temp (24hrs), Avg:99.3 F (37.4 C), Min:97.7 F (36.5 C), Max:100.7 F (38.2 C)  Propofol: none  Labs reviewed. Sodium 147 (H), magnesium 1.5 (L) CBG's: [phone] Medications reviewed and include Lasix.  Diet Order:  Diet NPO time specified  EDUCATION NEEDS:   No education needs have been identified at this time  Skin:  Skin Assessment: Skin Integrity Issues: Skin Integrity Issues:: Other (Comment) Other: venous stasis ulcer to R ankle and L toe  Last BM:  2/10  Height:   Ht Readings from Last 1 Encounters:  02/22/17 5\' 11"  (1.803 m)    Weight:   Wt Readings from Last 1 Encounters:  02/24/17 (!) 336 lb 13.8 oz (152.8 kg)    Ideal Body Weight:  78.18 kg  BMI:  Body mass index is 46.98 kg/m.  Estimated Nutritional Needs:   Kcal:  6063-0160  Protein:  175-195 gm  Fluid:  2 L   Molli Barrows, RD, LDN, [place] Pager [phone] After Hours Pager [phone]

## 2017-02-24 NOTE — Care Management Note (Addendum)
Case Management Note  Patient Details  Name: HAROLD MONCUS MRN: [id number] Date of Birth: May 24, 1955  Subjective/Objective:   Pt admitted with flu and pulm edema            Action/Plan:   PTA independent from home alone.  Pt currently intubated.    Expected Discharge Date:                  Expected Discharge Plan:  Home/Self Care  In-House Referral:     Discharge planning Services  CM Consult  Post Acute Care Choice:    Choice offered to:     DME Arranged:    DME Agency:     HH Arranged:    HH Agency:     Status of Service:     If discussed at [employer] of Stay Meetings, dates discussed:    Additional Comments: 02/24/2017 Pt self extubated and was immediately re intubated/  Pt is now 1 day s/p trach - still requiring vent.  Pt has increased WOB with agonal breaths, plan is to increase sedation and supplemental oxygen Maryclare Labrador, RN 02/24/2017, 4:03 PM

## 2017-02-24 NOTE — Progress Notes (Signed)
[facility] afternoon follow-up     Interval/events   Hypotensive: SVV and SV suggesting volume depletion Now getting Fluid challenge w/ resulting improvement in blood pressure, cardiac index, and stroke volume   Vitals/hemodynamics/gtts   BP (!) 88/72   Pulse (!) 105   Temp 99.8 F (37.7 C) (Axillary)   Resp (!) 31   Ht 5\' 11"  (1.803 m)   Wt (!) 336 lb 13.8 oz (152.8 kg)   SpO2 (!) 88%   BMI 46.98 kg/m    . sodium chloride    . feeding supplement (VITAL HIGH PROTEIN) 1,000 mL (02/24/17 1537)  . heparin 2,200 Units/hr (02/24/17 1400)  . lactated ringers    . milrinone 0.25 mcg/kg/min (02/24/17 1400)  . phenylephrine (NEO-SYNEPHRINE) Adult infusion 20 mcg/min (02/24/17 1610)  . propofol (DIPRIVAN) infusion 15 mcg/kg/min (02/24/17 1434)    Intake/Output Summary (Last 24 hours) at 02/24/2017 1631 Last data filed at 02/24/2017 1400 Gross per 24 hour  Intake 2513.37 ml  Output 3375 ml  Net -861.63 ml    Physical exam   General: This is a critically ill 62 year old white male currently sedated on propofol infusion and requiring full mechanical ventilation HEENT: Normocephalic atraumatic.  His #6 cuffed tracheostomy is unremarkable Pulmonary: Improved work of breathing, decreased bases, occasional rhonchi. Cardiac: Regular irregular Abdomen: Soft nontender Extremities: Warm, trunk pulses, decreasing edema Neuro: Sedated  Recent Labs  Lab 02/22/17 0430 02/23/17 0445 02/24/17 0507  NA 145 147* 147*  K 3.6 3.5 3.6  CL 99* 98* 98*  CO2 34* 36* 33*  BUN 31* 30* 30*  CREATININE 0.86 0.78 0.89  GLUCOSE 136* 156* 132*   Recent Labs  Lab 02/23/17 0445 02/24/17 0507 02/24/17 1426  HGB 14.3 15.6 14.2  HCT 45.8 48.7 44.7  WBC 13.5* 12.6* 10.3  PLT 140* 179 172   ABG    Component Value Date/Time   PHART 7.601 (HH) 02/24/2017 1310   PCO2ART 37.6 02/24/2017 1310   PO2ART 59.3 (L) 02/24/2017 1310   HCO3 37.2 (H) 02/24/2017 1310   TCO2 >50 (H)  02/23/2017 0329   ACIDBASEDEF 2.0 02/18/2017 1213   O2SAT 93.0 02/24/2017 1310    Impression/plan   Acute hypoxic respiratory failure in the setting of diffuse pulmonary infiltrates.  Suspect this is been a mix of both pulmonary edema as well as pneumonitis/acute lung injury from influenza A. -Hemodynamics at this point would support and he is now volume depleted.  Suspect chest x-ray changes at this point reflect more inflammatory process than pulmonary edema Plan Continue full ventilator support Continue PEEP at 12, wean FiO2 Continue PAD protocol RASS goal -2  Hypovolemic shock this is secondary to diuresis.  Stroke volume variation greater than 21, cardiac index down, responding favorably to fluid challenge with marked improvement in cardiac index as well as stroke volume Plan Hold off on further diuretics Continue milrinone Continue hemodynamic monitoring KVO IV fluids after fluid bolus complete. -   CCT: 43 min   Erick Colace ACNP-BC [place] Pager # [phone] OR # [phone] if no answer

## 2017-02-24 NOTE — Progress Notes (Signed)
Peripherally Inserted Central Catheter/Midline Placement  The IV Nurse has discussed with the patient and/or persons authorized to consent for the patient, the purpose of this procedure and the potential benefits and risks involved with this procedure.  The benefits include less needle sticks, lab draws from the catheter, and the patient may be discharged home with the catheter. Risks include, but not limited to, infection, bleeding, blood clot (thrombus formation), and puncture of an artery; nerve damage and irregular heartbeat and possibility to perform a PICC exchange if needed/ordered by physician.  Alternatives to this procedure were also discussed.  Bard Power PICC patient education guide, fact sheet on infection prevention and patient information card has been provided to patient /or left at bedside.    PICC/Midline Placement Documentation  PICC Triple Lumen 02/24/17 PICC Right Cephalic 51 cm 0 cm (Active)  Indication for Insertion or Continuance of Line Limited venous access - need for IV therapy >5 days (PICC only) 02/24/2017  6:00 PM  Exposed Catheter (cm) 0 cm 02/24/2017  6:00 PM  Site Assessment Clean;Dry;Intact 02/24/2017  6:00 PM  Lumen #1 Status Flushed;Blood return noted 02/24/2017  6:00 PM  Lumen #2 Status Flushed;Blood return noted 02/24/2017  6:00 PM  Lumen #3 Status Flushed;Blood return noted 02/24/2017  6:00 PM  Dressing Type Transparent 02/24/2017  6:00 PM  Dressing Status Clean;Dry;Intact;Antimicrobial disc in place 02/24/2017  6:00 PM  Dressing Change Due 03/03/17 02/24/2017  6:00 PM       Jule Economy H[place] 02/24/2017, 6:04 PM

## 2017-02-24 NOTE — Progress Notes (Addendum)
ANTICOAGULATION CONSULT NOTE - Follow Up  Pharmacy Consult:  Heparin Indication: atrial fibrillation  Allergies  Allergen Reactions  . Penicillins Swelling    Age 62 or 62 - pt states he had swelling all over and his lips got real big   Patient Measurements: Height: 5\' 11"  (180.3 cm) Weight: (!) 336 lb 13.8 oz (152.8 kg) IBW/kg (Calculated) : 75.3 Heparin Dosing Weight: 117 kg  Vital Signs: Temp: 99.8 F (37.7 C) (02/15 1222) Temp Source: Axillary (02/15 1222) BP: 88/72 (02/15 1500) Pulse Rate: 105 (02/15 1520)  Labs: Recent Labs    02/22/17 0430 02/23/17 0445 02/24/17 0507 02/24/17 1426  HGB 13.7 14.3 15.6 14.2  HCT 44.2 45.8 48.7 44.7  PLT 96* 140* 179 172  HEPARINUNFRC 0.42 0.39 0.22* 0.29*  CREATININE 0.86 0.78 0.89  --      Assessment: 62 yo M with new Afib started on IV heparin. Heparin was held yesterday for bedside trach and was re-started around 1813 last PM.  Heparin level low this morning so rate increased, remains borderline at 0.29 - no issues with infusion per RN.   Goal of Therapy:  Heparin level 0.3-0.7 units/ml Monitor platelets by anticoagulation protocol: Yes    Plan:  Increase heparin to 2300 units/hr  Recheck heparin level with am labs Daily heparin level and CBC Monitor for signs/symptoms of bleeding   Arrie Senate, PharmD, BCPS PGY-2 Cardiology Pharmacy Resident Pager: [phone] 02/24/2017

## 2017-02-24 NOTE — Progress Notes (Signed)
240mL Fentanyl wasted in sink, witness by Marya Amsler, RN

## 2017-02-25 ENCOUNTER — Inpatient Hospital Stay (HOSPITAL_COMMUNITY): Payer: BLUE CROSS/BLUE SHIELD

## 2017-02-25 LAB — BLOOD GAS, ARTERIAL
ACID-BASE EXCESS: 16.9 mmol/L — AB (ref 0.0–2.0)
BICARBONATE: 41.7 mmol/L — AB (ref 20.0–28.0)
Drawn by: 252031
FIO2: 90
LHR: 24 {breaths}/min
MECHVT: 600 mL
O2 SAT: 94.2 %
PEEP/CPAP: 12 cmH2O
Patient temperature: 98.6
pCO2 arterial: 54.8 mmHg — ABNORMAL HIGH (ref 32.0–48.0)
pH, Arterial: 7.494 — ABNORMAL HIGH (ref 7.350–7.450)
pO2, Arterial: 73.6 mmHg — ABNORMAL LOW (ref 83.0–108.0)

## 2017-02-25 LAB — HEPARIN LEVEL (UNFRACTIONATED): Heparin Unfractionated: 0.53 IU/mL (ref 0.30–0.70)

## 2017-02-25 LAB — GLUCOSE, CAPILLARY
GLUCOSE-CAPILLARY: 161 mg/dL — AB (ref 65–99)
GLUCOSE-CAPILLARY: 139 mg/dL — AB (ref 65–99)
Glucose-Capillary: 146 mg/dL — ABNORMAL HIGH (ref 65–99)
Glucose-Capillary: 139 mg/dL — ABNORMAL HIGH (ref 65–99)
Glucose-Capillary: 153 mg/dL — ABNORMAL HIGH (ref 65–99)

## 2017-02-25 LAB — BASIC METABOLIC PANEL
ANION GAP: 14 (ref 5–15)
BUN: 39 mg/dL — ABNORMAL HIGH (ref 6–20)
CHLORIDE: 100 mmol/L — AB (ref 101–111)
CO2: 33 mmol/L — AB (ref 22–32)
Calcium: 8.2 mg/dL — ABNORMAL LOW (ref 8.9–10.3)
Creatinine, Ser: 0.95 mg/dL (ref 0.61–1.24)
GFR calc Af Amer: >60 mL/min (ref >60)
GLUCOSE: 157 mg/dL — AB (ref 65–99)
POTASSIUM: 2.8 mmol/L — AB (ref 3.5–5.1)
Sodium: 147 mmol/L — ABNORMAL HIGH (ref 135–145)

## 2017-02-25 LAB — COOXEMETRY PANEL
CARBOXYHEMOGLOBIN: 1.1 % (ref 0.5–1.5)
METHEMOGLOBIN: 1.1 % (ref 0.0–1.5)
O2 Saturation: 93.2 %
Total hemoglobin: 13.9 g/dL (ref 12.0–16.0)

## 2017-02-25 LAB — MAGNESIUM: MAGNESIUM: 2.2 mg/dL (ref 1.7–2.4)

## 2017-02-25 LAB — CBC
HEMATOCRIT: 43.3 % (ref 39.0–52.0)
HEMOGLOBIN: 13.7 g/dL (ref 13.0–17.0)
MCH: 30.2 pg (ref 26.0–34.0)
MCHC: 31.6 g/dL (ref 30.0–36.0)
MCV: 95.4 fL (ref 78.0–100.0)
Platelets: 221 10*3/uL (ref 150–400)
RBC: 4.54 MIL/uL (ref 4.22–5.81)
RDW: 16.7 % — ABNORMAL HIGH (ref 11.5–15.5)
WBC: 17.6 10*3/uL — ABNORMAL HIGH (ref 4.0–10.5)

## 2017-02-25 LAB — PHOSPHORUS: PHOSPHORUS: 3.6 mg/dL (ref 2.5–4.6)

## 2017-02-25 MED ORDER — POTASSIUM CHLORIDE 20 MEQ/15ML (10%) PO SOLN
40.0000 meq | Freq: Once | ORAL | Status: AC
  Administered 2017-02-25: 40 meq
  Filled 2017-02-25: qty 30

## 2017-02-25 MED ORDER — MILRINONE LACTATE IN DEXTROSE 20-5 MG/100ML-% IV SOLN
0.1250 ug/kg/min | INTRAVENOUS | Status: DC
  Administered 2017-02-25 – 2017-02-28 (×8): 0.25 ug/kg/min via INTRAVENOUS
  Administered 2017-02-28 – 2017-03-01 (×2): 0.125 ug/kg/min via INTRAVENOUS
  Filled 2017-02-25 (×10): qty 100

## 2017-02-25 NOTE — Progress Notes (Signed)
PULMONARY / CRITICAL CARE MEDICINE   Name: MURRIEL EIDEM MRN: [id number] DOB: 21-Apr-1955    ADMISSION DATE:  02/16/2017 CONSULTATION DATE:  02/16/2017  REFERRING MD:  Regenia Skeeter  CHIEF COMPLAINT:  DYspnea  HISTORY OF PRESENT ILLNESS:   62y/o male with a history of lymphoma in remission, last treated in 2012 who was admitted with acute hypoxemic respiratory failure in the setting of influenza and likely pulmonary edema.     SUBJECTIVE: Better sleep last night but continues to require high PEEP, high FiO2. Note that his cardiac index responded favorably to a bolus 2/15, remains net 8.6 L negative   VITAL SIGNS: BP (!) 89/62 (BP Location: Left Arm)   Pulse (!) 111   Temp 98.6 F (37 C) (Axillary)   Resp (!) 31   Ht _0  (1.803 m)   Wt (!) 154.5 kg (340 lb 9.8 oz)   SpO2 97%   BMI 47.51 kg/m   HEMODYNAMICS:    VENTILATOR SETTINGS: Vent Mode: PRVC FiO2 (%):  [70 %-90 %] 80 % Set Rate:  [24 bmp] 24 bmp Vt Set:  [600 mL] 600 mL PEEP:  [8 cmH20-12 cmH20] 12 cmH20 Plateau Pressure:  [14 cmH20-34 cmH20] 25 cmH20 Filed Weights   02/23/17 0500 02/24/17 0500 02/25/17 0444  Weight: (!) 161.9 kg (356 lb 14.8 oz) (!) 152.8 kg (336 lb 13.8 oz) (!) 154.5 kg (340 lb 9.8 oz)   INTAKE / OUTPUT:  Intake/Output Summary (Last 24 hours) at 02/25/2017 1035 Last data filed at 02/25/2017 0900 Gross per 24 hour  Intake 5343.66 ml  Output 3035 ml  Net 2308.66 ml    PHYSICAL EXAMINATION: General: This is a massively obese 62 year old white male currently sedated on ventilator HEENT: Normocephalic atraumatic.  He now has a #6 cuffed tracheostomy in place trach stoma is unremarkable Pulmonary: Equal chest rise on ventilator diminished throughout Cardiac: Regular irregular with atrial fibrillation on telemetry no murmur rub or gallop Abdomen: Obese, soft, no organomegaly, positive bowel sounds. GU: Clear yellow urine Extremities/musculoskeletal: Equal strength and bulk.  He has chronic venous  stasis changes bilateral lower extremities with improving level of edema Neuro/psych: Arouses to voice, moves all extremities, will follow commands intermittently.  He is more sedated today than when compared to prior exams  LABS:  BMET Recent Labs  Lab 02/23/17 0445 02/24/17 0507 02/25/17 0445  NA 147* 147* 147*  K 3.5 3.6 2.8*  CL 98* 98* 100*  CO2 36* 33* 33*  BUN 30* 30* 39*  CREATININE 0.78 0.89 0.95  GLUCOSE 156* 132* 157*    Electrolytes Recent Labs  Lab 02/23/17 0445 02/24/17 0507 02/25/17 0445  CALCIUM 8.5* 8.8* 8.2*  MG 1.7 1.5* 2.2  PHOS 2.4* 3.5 3.6    CBC Recent Labs  Lab 02/24/17 0507 02/24/17 1426 02/25/17 0445  WBC 12.6* 10.3 17.6*  HGB 15.6 14.2 13.7  HCT 48.7 44.7 43.3  PLT 179 172 221    Coag's No results for input(s): APTT, INR in the last 168 hours.  Sepsis Markers Recent Labs  Lab 02/22/17 1044 02/23/17 0445 02/24/17 0507  PROCALCITON 0.40 0.33 0.51    ABG Recent Labs  Lab 02/24/17 0412 02/24/17 1310 02/25/17 0310  PHART 7.567* 7.601* 7.494*  PCO2ART 45.2 37.6 54.8*  PO2ART 65.9* 59.3* 73.6*    Liver Enzymes No results for input(s): AST, ALT, ALKPHOS, BILITOT, ALBUMIN in the last 168 hours.  Cardiac Enzymes No results for input(s): TROPONINI, PROBNP in the last 168 hours.  Glucose Recent  Labs  Lab 02/24/17 1219 02/24/17 1814 02/24/17 2034 02/24/17 2340 02/25/17 0321 02/25/17 0852  GLUCAP 153* 163* 157* 169* 161* 139*    Imaging Dg Chest Port 1 View  Result Date: 02/25/2017 CLINICAL DATA:  Pneumonia EXAM: PORTABLE CHEST 1 VIEW COMPARISON:  02/24/2017 FINDINGS: Tracheostomy in good position. Feeding tube remains in place with the tip not visualized. Right arm PICC tip not well seen overlying the SVC. Port-A-Cath tip in the SVC unchanged Extensive bilateral airspace disease unchanged. No significant pleural effusion. IMPRESSION: Diffuse bilateral airspace disease unchanged. Electronically Signed   By: Franchot Gallo M.D.   On: 02/25/2017 08:21   Dg Chest Port 1 View  Result Date: 02/24/2017 CLINICAL DATA:  62 year old male status post PICC line placement. EXAM: PORTABLE CHEST 1 VIEW COMPARISON:  1147 hr today, and earlier. FINDINGS: Portable AP semi upright view at 1813 hr. Right upper extremity approach PICC line has been placed. The catheter tip is difficult to identify but probably at the cavoatrial junction level (arrow). Stable tracheostomy tube and left chest porta cath, accessed. Stable ventilation. No pneumothorax. Stable cardiac size and mediastinal contours. IMPRESSION: 1. Right upper extremity approach PICC line placed, tip difficult to visualize but appears to be at the cavoatrial junction level. 2. Otherwise stable chest. Electronically Signed   By: Genevie Ann M.D.   On: 02/24/2017 18:54   Dg Chest Port 1 View  Result Date: 02/24/2017 CLINICAL DATA:  Hypoxia EXAM: PORTABLE CHEST 1 VIEW COMPARISON:  Yesterday FINDINGS: Feeding tube that at least reaches the stomach. Porta catheter on the left with tip the stomach at the SVC. Tracheostomy tube is well seated. Stable cardiomegaly and extensive airspace opacity. No visible effusion or pneumothorax. IMPRESSION: 1. History of viral pneumonitis. Bilateral airspace disease is unchanged from yesterday. 2. Cardiomegaly. Electronically Signed   By: Monte Fantasia M.D.   On: 02/24/2017 12:17    STUDIES:  2/8 echo > EF 25-30% moderately dilated left ventricle, diffuse hypokinesis mild mitral valve regurgitation, left atrium mildly dilated  CULTURES: 2/7 BCx 2 >> negative 2/7 RVP >> influenza A 2/7 Sputum cx >> normal flora 02/18/2017 BAL>> negative  ANTIBIOTICS: Levaquin 2/3 >2/5; 2/7 x 1 Vancomycin 2/7 >> 2/12 Azactam 2/7 >> 2/12 Tamiflu 2/8 > completed   SIGNIFICANT EVENTS: 2/7 Admit 02/18/2017 intubated for acute respiratory failure  LINES/TUBES: Left chest port >> accessed 2/7 02/18/2017 orotracheal tube>>2/14 02/18/2017 orogastric  tube>>2/14 Trach 2/14>>>  DISCUSSION: Mr. Dykman remains ventilator and tracheostomy dependent in the setting of influenza A pneumonitis which is significantly complicated by acute systolic heart failure with element of pulmonary edema.  I am worried that his new cardiomyopathy has been a significant factor in his failure to stay extubated.  His cardiac index by noninvasive monitoring is less than 2, and he still appears to have significant degree of pulmonary edema.  For today the plan will be to augment his cardiac output will trial milrinone, hopefully this will not exacerbate his atrial fibrillation.  We will also continue aggressive diuresis, and continue aggressive weaning of PEEP/FiO2.  We also need to continue to focus on delirium treatment.  This includes getting him out of bed, Precedex infusion with RASS goal of 0, and encouraging normal sleep-wake cycle.  Melatonin  ASSESSMENT / PLAN:  Acute respiratory failure with hypoxemia in setting of viral pneumonitis (Influenza A) c/b Acute pulmonary edema, failed extubation on 2/14 now trach dependent  Respiratory alkalosis  PCXR: Portable chest x-ray personally reviewed 2/14 this demonstrates bilateral pulmonary  infiltrates much improvement in comparison to prior films.  Tracheostomy is in satisfactory position.  Plan Continue to try to wean PEEP and FiO2 as his agitation is improved, cardiac hemodynamics improved. Consider empiric steroids as his infiltrates persist and do not appear to be responsive to aggressive diuresis Deferring diuretics again on 2/16.  Following cardiac indices Mobilize out of bed if possible Trach care  Acute systolic HF w/ pulmonary edema (EF 25%) Atrial fib  Suspect that his cardiomyopathy played a role in his failed extubation Plan Continue milrinone, titrate based on noninvasive cardiac index Continue heparin, carvedilol Continue Lipitor and aspirin  Intermittent Fluid and electrolyte imbalance:  hypomagnesemia & hypernatremia  Hypokalemia Plan IV fluids KVO Continue free water Replace electrolytes as indicated  Mild thrombocytopenia-->improving History of non-hodgkins lymphoma Plan Follow CBC  Bilateral foot wounds Plan Foot drop boots Wound care  DM2   Plan Sliding scale insulin per protocol, follow CBG  FAMILY  Reviewed status with sister at bedside 2/16  DVT prophylaxis: heparin gtt SUP: ppi Diet: tubefeeds Activity: BR Disposition : ICU  independent CC time 32 minutes  Baltazar Apo, MD, PhD 02/25/2017, 10:43 AM [facility] [phone] or if no answer [phone]

## 2017-02-25 NOTE — Progress Notes (Signed)
ANTICOAGULATION CONSULT NOTE - Follow Up  Pharmacy Consult:  Heparin Indication: atrial fibrillation  Allergies  Allergen Reactions  . Penicillins Swelling    Age 62 or 71 - pt states he had swelling all over and his lips got real big he had swelling all over and his lips got real big   Patient Measurements: Height: 5\' 11"  (180.3 cm) Weight: (!) 340 lb 9.8 oz (154.5 kg) IBW/kg (Calculated) : 75.3 Heparin Dosing Weight: 117 kg  Vital Signs: Temp: 98.6 F (37 C) (02/16 0854) Temp Source: Axillary (02/16 0854) BP: 89/62 (02/16 0818) Pulse Rate: 115 (02/16 1128)  Labs: Recent Labs    02/23/17 0445 02/24/17 0507 02/24/17 1426 02/25/17 0435 02/25/17 0445  HGB 14.3 15.6 14.2  --  13.7  HCT 45.8 48.7 44.7  --  43.3  PLT 140* 179 172  --  221  HEPARINUNFRC 0.39 0.22* 0.29* 0.53  --   CREATININE 0.78 0.89  --   --  0.95    Assessment: 62 yo M with new Afib started on IV heparin.   Now s/p trach  Hep therapeutic at 2300 units/hr  Goal of Therapy:  Heparin level 0.3-0.7 units/ml Monitor platelets by anticoagulation protocol: Yes  Plan:  heparin 2300 units/hr  Daily heparin level and CBC  Levester Fresh, PharmD, BCPS, BCCCP Clinical Pharmacist Clinical phone for 02/25/2017 from 7a-3:30p: [phone] If after 3:30p, please call main pharmacy at: [phone] 02/25/2017 11:47 AM

## 2017-02-25 NOTE — Progress Notes (Signed)
RT attempted to wean FIO2 to 60% but patient desatting into the 80's. FIO2 placed back at 70%. Vitals are stable. RT will continue to monitor.

## 2017-02-26 ENCOUNTER — Inpatient Hospital Stay (HOSPITAL_COMMUNITY): Payer: BLUE CROSS/BLUE SHIELD

## 2017-02-26 LAB — BASIC METABOLIC PANEL
Anion gap: 12 (ref 5–15)
BUN: 41 mg/dL — AB (ref 6–20)
CALCIUM: 8 mg/dL — AB (ref 8.9–10.3)
CO2: 33 mmol/L — AB (ref 22–32)
CREATININE: 0.84 mg/dL (ref 0.61–1.24)
Chloride: 103 mmol/L (ref 101–111)
GFR calc Af Amer: >60 mL/min (ref >60)
GFR calc non Af Amer: >60 mL/min (ref >60)
GLUCOSE: 156 mg/dL — AB (ref 65–99)
Potassium: 2.9 mmol/L — ABNORMAL LOW (ref 3.5–5.1)
Sodium: 148 mmol/L — ABNORMAL HIGH (ref 135–145)

## 2017-02-26 LAB — CBC
HCT: 39.9 % (ref 39.0–52.0)
Hemoglobin: 12.4 g/dL — ABNORMAL LOW (ref 13.0–17.0)
MCH: 29.8 pg (ref 26.0–34.0)
MCHC: 31.1 g/dL (ref 30.0–36.0)
MCV: 95.9 fL (ref 78.0–100.0)
PLATELETS: 210 10*3/uL (ref 150–400)
RBC: 4.16 MIL/uL — ABNORMAL LOW (ref 4.22–5.81)
RDW: 16.8 % — AB (ref 11.5–15.5)
WBC: 16.4 10*3/uL — AB (ref 4.0–10.5)

## 2017-02-26 LAB — HEPARIN LEVEL (UNFRACTIONATED): HEPARIN UNFRACTIONATED: 0.37 [IU]/mL (ref 0.30–0.70)

## 2017-02-26 LAB — BLOOD GAS, ARTERIAL
ACID-BASE EXCESS: 11.4 mmol/L — AB (ref 0.0–2.0)
BICARBONATE: 35.5 mmol/L — AB (ref 20.0–28.0)
Drawn by: 345601
FIO2: 70
LHR: 24 {breaths}/min
MECHVT: 600 mL
O2 SAT: 93 %
PATIENT TEMPERATURE: 98.6
PEEP/CPAP: 12 cmH2O
PH ART: 7.491 — AB (ref 7.350–7.450)
PO2 ART: 67.3 mmHg — AB (ref 83.0–108.0)
pCO2 arterial: 46.9 mmHg (ref 32.0–48.0)

## 2017-02-26 LAB — MAGNESIUM: MAGNESIUM: 2.2 mg/dL (ref 1.7–2.4)

## 2017-02-26 LAB — GLUCOSE, CAPILLARY
GLUCOSE-CAPILLARY: 131 mg/dL — AB (ref 65–99)
Glucose-Capillary: 131 mg/dL — ABNORMAL HIGH (ref 65–99)
Glucose-Capillary: 135 mg/dL — ABNORMAL HIGH (ref 65–99)
Glucose-Capillary: 163 mg/dL — ABNORMAL HIGH (ref 65–99)
Glucose-Capillary: 184 mg/dL — ABNORMAL HIGH (ref 65–99)
Glucose-Capillary: 204 mg/dL — ABNORMAL HIGH (ref 65–99)
Glucose-Capillary: 209 mg/dL — ABNORMAL HIGH (ref 65–99)

## 2017-02-26 LAB — COOXEMETRY PANEL
Carboxyhemoglobin: 1 % (ref 0.5–1.5)
Methemoglobin: 0.8 % (ref 0.0–1.5)
O2 Saturation: 96 %
Total hemoglobin: 12.6 g/dL (ref 12.0–16.0)

## 2017-02-26 LAB — PHOSPHORUS: PHOSPHORUS: 3.5 mg/dL (ref 2.5–4.6)

## 2017-02-26 LAB — TRIGLYCERIDES: Triglycerides: 122 mg/dL (ref <150)

## 2017-02-26 MED ORDER — POTASSIUM CHLORIDE 20 MEQ/15ML (10%) PO SOLN
40.0000 meq | ORAL | Status: AC
  Administered 2017-02-26 (×3): 40 meq
  Filled 2017-02-26 (×4): qty 30

## 2017-02-26 MED ORDER — METHYLPREDNISOLONE SODIUM SUCC 125 MG IJ SOLR
60.0000 mg | Freq: Four times a day (QID) | INTRAMUSCULAR | Status: AC
  Administered 2017-02-26 – 2017-03-01 (×12): 60 mg via INTRAVENOUS
  Filled 2017-02-26 (×7): qty 0.96
  Filled 2017-02-26: qty 2
  Filled 2017-02-26 (×4): qty 0.96

## 2017-02-26 MED ORDER — INSULIN ASPART 100 UNIT/ML ~~LOC~~ SOLN
0.0000 [IU] | SUBCUTANEOUS | Status: DC
  Administered 2017-02-26: 3 [IU] via SUBCUTANEOUS
  Administered 2017-02-26 (×2): 5 [IU] via SUBCUTANEOUS
  Administered 2017-02-27 (×4): 3 [IU] via SUBCUTANEOUS
  Administered 2017-02-27: 2 [IU] via SUBCUTANEOUS
  Administered 2017-02-28: 3 [IU] via SUBCUTANEOUS
  Administered 2017-02-28 (×2): 5 [IU] via SUBCUTANEOUS
  Administered 2017-02-28 – 2017-03-01 (×6): 3 [IU] via SUBCUTANEOUS
  Administered 2017-03-01: 2 [IU] via SUBCUTANEOUS
  Administered 2017-03-01: 3 [IU] via SUBCUTANEOUS
  Administered 2017-03-01: 5 [IU] via SUBCUTANEOUS
  Administered 2017-03-02 – 2017-03-16 (×24): 2 [IU] via SUBCUTANEOUS
  Administered 2017-03-16: 1 [IU] via SUBCUTANEOUS
  Administered 2017-03-17 – 2017-03-18 (×5): 2 [IU] via SUBCUTANEOUS
  Administered 2017-03-19: 5 [IU] via SUBCUTANEOUS

## 2017-02-26 MED ORDER — FREE WATER
300.0000 mL | Status: DC
  Administered 2017-02-26 – 2017-02-27 (×7): 300 mL

## 2017-02-26 NOTE — Progress Notes (Signed)
ANTICOAGULATION CONSULT NOTE - Follow Up  Pharmacy Consult:  Heparin Indication: atrial fibrillation  Allergies  Allergen Reactions  . Penicillins Swelling    Age 62 or 61 - pt states he had swelling all over and his lips got real big   Patient Measurements: Height: 5\' 11"  (180.3 cm) Weight: (!) 346 lb 9 oz (157.2 kg) IBW/kg (Calculated) : 75.3 Heparin Dosing Weight: 117 kg  Vital Signs: Temp: 99 F (37.2 C) (02/17 0825) Temp Source: Axillary (02/17 0825) BP: 106/73 (02/17 0831) Pulse Rate: 100 (02/17 0831)  Labs: Recent Labs    02/24/17 0507 02/24/17 1426 02/25/17 0435 02/25/17 0445 02/26/17 0429  HGB 15.6 14.2  --  13.7 12.4*  HCT 48.7 44.7  --  43.3 39.9  PLT 179 172  --  221 210  HEPARINUNFRC 0.22* 0.29* 0.53  --  0.37  CREATININE 0.89  --   --  0.95 0.84    Assessment: 61 yo M with new Afib started on IV heparin.   Now s/p trach  Hep therapeutic at 2300 units/hr  Goal of Therapy:  Heparin level 0.3-0.7 units/ml Monitor platelets by anticoagulation protocol: Yes  Plan:  heparin 2300 units/hr  Daily heparin level and CBC  Levester Fresh, PharmD, BCPS, BCCCP Clinical Pharmacist Clinical phone for 02/26/2017 from 7a-3:30p: [phone] If after 3:30p, please call main pharmacy at: [phone] 02/26/2017 9:32 AM

## 2017-02-26 NOTE — Progress Notes (Signed)
PULMONARY / CRITICAL CARE MEDICINE   Name: [name] MRN: [id number] DOB: [birth year]    ADMISSION DATE:  02/16/2017 CONSULTATION DATE:  02/16/2017  REFERRING MD:  Regenia Skeeter  CHIEF COMPLAINT:  DYspnea  HISTORY OF PRESENT ILLNESS:   62y/o male with a history of lymphoma in remission, last treated in 2012 who was admitted with acute hypoxemic respiratory failure in the setting of influenza and likely pulmonary edema.     SUBJECTIVE: To need hypoxemia with high PEEP and FiO2 needs, slightly improved FiO2 to 70% Less agitated Positive fluid balance over the last 2.5 days, remains -5.5 L total   VITAL SIGNS: BP 106/73   Pulse 100   Temp 99 F (37.2 C) (Axillary)   Resp (!) 28   Ht _0  (1.803 m)   Wt (!) 157.2 kg (346 lb 9 oz)   SpO2 91%   BMI 48.34 kg/m   HEMODYNAMICS:    VENTILATOR SETTINGS: Vent Mode: PRVC FiO2 (%):  [70 %] 70 % Set Rate:  [24 bmp] 24 bmp Vt Set:  [600 mL] 600 mL PEEP:  [12 cmH20] 12 cmH20 Plateau Pressure:  [21 cmH20-31 cmH20] 31 cmH20 Filed Weights   02/24/17 0500 02/25/17 0444 02/26/17 0427  Weight: (!) 152.8 kg (336 lb 13.8 oz) (!) 154.5 kg (340 lb 9.8 oz) (!) 157.2 kg (346 lb 9 oz)   INTAKE / OUTPUT:  Intake/Output Summary (Last 24 hours) at 02/26/2017 7619 Last data filed at 02/26/2017 0800 Gross per 24 hour  Intake 5706.41 ml  Output 2115 ml  Net 3591.41 ml    PHYSICAL EXAMINATION: General: This is a massively obese 61 year old white male currently sedated on ventilator HEENT: Normocephalic atraumatic.  He now has a #6 cuffed tracheostomy in place trach stoma is unremarkable Pulmonary: Equal chest rise on ventilator diminished throughout Cardiac: Regular irregular with atrial fibrillation on telemetry no murmur rub or gallop Abdomen: Obese, soft, no organomegaly, positive bowel sounds. GU: Clear yellow urine Extremities/musculoskeletal: Equal strength and bulk.  He has chronic venous stasis changes bilateral lower extremities with  improving level of edema Neuro/psych: A bit more sedated, no evidence of agitation, he does open eyes and is able to follow commands.  Quickly back to sleep  LABS:  BMET Recent Labs  Lab 02/24/17 0507 02/25/17 0445 02/26/17 0429  NA 147* 147* 148*  K 3.6 2.8* 2.9*  CL 98* 100* 103  CO2 33* 33* 33*  BUN 30* 39* 41*  CREATININE 0.89 0.95 0.84  GLUCOSE 132* 157* 156*    Electrolytes Recent Labs  Lab 02/24/17 0507 02/25/17 0445 02/26/17 0429  CALCIUM 8.8* 8.2* 8.0*  MG 1.5* 2.2 2.2  PHOS 3.5 3.6 3.5    CBC Recent Labs  Lab 02/24/17 1426 02/25/17 0445 02/26/17 0429  WBC 10.3 17.6* 16.4*  HGB 14.2 13.7 12.4*  HCT 44.7 43.3 39.9  PLT 172 221 210    Coag's No results for input(s): APTT, INR in the last 168 hours.  Sepsis Markers Recent Labs  Lab 02/22/17 1044 02/23/17 0445 02/24/17 0507  PROCALCITON 0.40 0.33 0.51    ABG Recent Labs  Lab 02/24/17 1310 02/25/17 0310 02/26/17 0332  PHART 7.601* 7.494* 7.491*  PCO2ART 37.6 54.8* 46.9  PO2ART 59.3* 73.6* 67.3*    Liver Enzymes No results for input(s): AST, ALT, ALKPHOS, BILITOT, ALBUMIN in the last 168 hours.  Cardiac Enzymes No results for input(s): TROPONINI, PROBNP in the last 168 hours.  Glucose Recent Labs  Lab 02/25/17 1257 02/25/17 1548  02/25/17 1958 02/26/17 0003 02/26/17 0407 02/26/17 0820  GLUCAP 146* 139* 153* 131* 131* 135*    Imaging Dg Chest Port 1 View  Result Date: 02/26/2017 CLINICAL DATA:  Acute respiratory failure. EXAM: PORTABLE CHEST 1 VIEW COMPARISON:  02/25/2017 FINDINGS: Enteric tube and tracheostomy tube unchanged. Left subclavian central venous catheter unchanged with tip over the SVC. Right-sided PICC line has tip over the SVC unchanged. Lungs are adequately inflated and demonstrate persistent hazy airspace opacification over the mid lungs and lung bases with slight interval improvement likely infection, although edema is possible. Cardiomediastinal silhouette and  remainder of the exam is unchanged. IMPRESSION: Persistent hazy airspace process over the mid lungs and lung bases with slight interval improvement. Findings likely due to infection although edema is possible. Tubes and lines as described. Electronically Signed   By: Marin Olp M.D.   On: 02/26/2017 08:37    STUDIES:  2/8 echo > EF 25-30% moderately dilated left ventricle, diffuse hypokinesis mild mitral valve regurgitation, left atrium mildly dilated  CULTURES: 2/7 BCx 2 >> negative 2/7 RVP >> influenza A 2/7 Sputum cx >> normal flora 02/18/2017 BAL>> negative  ANTIBIOTICS: Levaquin 2/3 >2/5; 2/7 x 1 Vancomycin 2/7 >> 2/12 Azactam 2/7 >> 2/12 Tamiflu 2/8 > completed   SIGNIFICANT EVENTS: 2/7 Admit 02/18/2017 intubated for acute respiratory failure  LINES/TUBES: Left chest port >> accessed 2/7 02/18/2017 orotracheal tube>>2/14 02/18/2017 orogastric tube>>2/14 Trach 2/14>>>  DISCUSSION: Obese 62 year old man admitted with respiratory failure due to influenza and possible superimposed bacterial pneumonia.  Also component of cardiogenic pulmonary edema given new discovery of EF 25%, atrial fibrillation.  Currently on milrinone with improvement in his cardiac index, also responded to volume suggestive of some component of over diuresis during the hospitalization.  Remains hypoxemic, continues to have bilateral infiltrates.  Could consider acute pulmonary embolism but has been on heparin drip for atrial fibrillation so this is unlikely.   ASSESSMENT / PLAN:  Acute respiratory failure with hypoxemia in setting of viral pneumonitis (Influenza A) c/b Acute pulmonary edema, failed extubation on 2/14 now trach dependent  Respiratory alkalosis .  Plan Continue try to wean PEEP and FiO2 as cardiac hemodynamics and agitation improve Given his persistent infiltrates with aggressive diuresis (to the point of probable cardiac compromise) I believe it would be reasonable to give a course of  empiric steroids to see if he would benefit. Considering possible pulmonary embolism but unlikely given that he has been on heparin. Defer diuretics again on 2/17, may need to restart low-dose on 2/18. abx and tamiflu completed Follow cardiac indices Trach care Mobilize out of bed when able  Acute systolic HF w/ pulmonary edema (EF 25%) Atrial fib  Suspect that his cardiomyopathy played a role in his failed extubation Plan Continue milrinone, titrate based on noninvasive cardiac index Continue heparin drip, carvedilol Continue Lipitor, aspirin  Intermittent Fluid and electrolyte imbalance: hypomagnesemia & hypernatremia  Hypokalemia Plan IV fluids KVO Increase free water 2/17 Replace electrolytes as indicated, potassium on 2/17  Mild thrombocytopenia-->improving History of non-hodgkins lymphoma Plan Follow CBC  Bilateral foot wounds Plan Foot drop boots Wound care  DM2   Plan Follow CBG, sliding scale insulin per protocol  FAMILY  Discussed with family at bedside on 2/17  DVT prophylaxis: heparin gtt SUP: ppi Diet: tubefeeds Activity: BR Disposition : ICU  independent CC time 33 minutes  Baltazar Apo, MD, PhD 02/26/2017, 9:18 AM [facility] [phone] or if no answer [phone]

## 2017-02-27 ENCOUNTER — Inpatient Hospital Stay (HOSPITAL_COMMUNITY): Payer: BLUE CROSS/BLUE SHIELD

## 2017-02-27 ENCOUNTER — Inpatient Hospital Stay: Payer: BLUE CROSS/BLUE SHIELD | Attending: Internal Medicine

## 2017-02-27 LAB — CBC
HCT: 37.7 % — ABNORMAL LOW (ref 39.0–52.0)
HEMOGLOBIN: 11.6 g/dL — AB (ref 13.0–17.0)
MCH: 29.7 pg (ref 26.0–34.0)
MCHC: 30.8 g/dL (ref 30.0–36.0)
MCV: 96.7 fL (ref 78.0–100.0)
Platelets: 218 10*3/uL (ref 150–400)
RBC: 3.9 MIL/uL — ABNORMAL LOW (ref 4.22–5.81)
RDW: 16.5 % — ABNORMAL HIGH (ref 11.5–15.5)
WBC: 11.6 10*3/uL — AB (ref 4.0–10.5)

## 2017-02-27 LAB — GLUCOSE, CAPILLARY
GLUCOSE-CAPILLARY: 183 mg/dL — AB (ref 65–99)
GLUCOSE-CAPILLARY: 187 mg/dL — AB (ref 65–99)
GLUCOSE-CAPILLARY: 208 mg/dL — AB (ref 65–99)
Glucose-Capillary: 190 mg/dL — ABNORMAL HIGH (ref 65–99)
Glucose-Capillary: 182 mg/dL — ABNORMAL HIGH (ref 65–99)
Glucose-Capillary: 203 mg/dL — ABNORMAL HIGH (ref 65–99)

## 2017-02-27 LAB — BASIC METABOLIC PANEL
ANION GAP: 10 (ref 5–15)
BUN: 42 mg/dL — ABNORMAL HIGH (ref 6–20)
CALCIUM: 8.1 mg/dL — AB (ref 8.9–10.3)
CO2: 31 mmol/L (ref 22–32)
CREATININE: 0.79 mg/dL (ref 0.61–1.24)
Chloride: 106 mmol/L (ref 101–111)
GLUCOSE: 217 mg/dL — AB (ref 65–99)
Potassium: 3.5 mmol/L (ref 3.5–5.1)
Sodium: 147 mmol/L — ABNORMAL HIGH (ref 135–145)

## 2017-02-27 LAB — COOXEMETRY PANEL
CARBOXYHEMOGLOBIN: 1.5 % (ref 0.5–1.5)
Methemoglobin: 0.6 % (ref 0.0–1.5)
O2 SAT: 81.8 %
Total hemoglobin: 12.2 g/dL (ref 12.0–16.0)

## 2017-02-27 LAB — MAGNESIUM: Magnesium: 2.4 mg/dL (ref 1.7–2.4)

## 2017-02-27 LAB — PHOSPHORUS: PHOSPHORUS: 3.1 mg/dL (ref 2.5–4.6)

## 2017-02-27 LAB — HEPARIN LEVEL (UNFRACTIONATED): HEPARIN UNFRACTIONATED: 0.34 [IU]/mL (ref 0.30–0.70)

## 2017-02-27 MED ORDER — FUROSEMIDE 10 MG/ML IJ SOLN
20.0000 mg | Freq: Once | INTRAMUSCULAR | Status: AC
Start: 2017-02-27 — End: 2017-02-27
  Administered 2017-02-27: 20 mg via INTRAVENOUS
  Filled 2017-02-27: qty 2

## 2017-02-27 MED ORDER — FREE WATER
400.0000 mL | Status: DC
  Administered 2017-02-27 – 2017-02-28 (×6): 400 mL

## 2017-02-27 NOTE — Progress Notes (Signed)
ANTICOAGULATION CONSULT NOTE - Follow Up  Pharmacy Consult:  Heparin Indication: atrial fibrillation  Allergies  Allergen Reactions  . Penicillins Swelling    Age 62 or 26 - pt states he had swelling all over and his lips got real big   Patient Measurements: Height: 5\' 11"  (180.3 cm) Weight: (!) 352 lb 11.8 oz (160 kg) IBW/kg (Calculated) : 75.3 Heparin Dosing Weight: 117 kg  Vital Signs: Temp: 98.5 F (36.9 C) (02/18 0406) Temp Source: Axillary (02/18 0406) BP: 110/70 (02/18 0600) Pulse Rate: 92 (02/18 0700)  Labs: Recent Labs    02/25/17 0435 02/25/17 0445 02/26/17 0429 02/27/17 0535 02/27/17 0601  HGB  --  13.7 12.4* 11.6*  --   HCT  --  43.3 39.9 37.7*  --   PLT  --  221 210 218  --   HEPARINUNFRC 0.53  --  0.37  --  0.34  CREATININE  --  0.95 0.84 0.79  --     Assessment: 62 yo M with new Afib started on IV heparin.   Now s/p trach  Heparin is therapeutic at 0.34 on 2300 units/hr. H/H is down slightly but platelets are stable. No signs of bleeding have been noted.   Goal of Therapy:  Heparin level 0.3-0.7 units/ml Monitor platelets by anticoagulation protocol: Yes  Plan:  Continue heparin at 2300 units/hr Daily heparin level and CBC Monitor for signs/symptoms of bleeding   Jimmy Footman, PharmD, BCPS PGY2 Infectious Diseases Pharmacy Resident Pager: [phone]  02/27/2017 7:55 AM

## 2017-02-27 NOTE — Progress Notes (Signed)
   02/27/17 1000  Clinical Encounter Type  Visited With Patient and family together  Visit Type Follow-up  Referral From Chaplain  Consult/Referral To Chaplain  Spiritual Encounters  Spiritual Needs Emotional  Stress Factors  Patient Stress Factors Exhausted  Family Stress Factors Exhausted  Pt was laying in bed still intubated. Male friend on-site working on her computer.  I talked to friend about patient's progress which she said was minimal but positive. I provided emotional support through reflective listening and compassionate presence.  Rowyn Mustapha a Medical sales representative, [employer]

## 2017-02-27 NOTE — Progress Notes (Signed)
PULMONARY / CRITICAL CARE MEDICINE   Name: [name] MRN: [id number] DOB: February 06, 1955    ADMISSION DATE:  02/16/2017 CONSULTATION DATE:  02/16/2017  REFERRING MD:  Regenia Skeeter  CHIEF COMPLAINT:  DYspnea  HISTORY OF PRESENT ILLNESS:   62y/o male with a history of lymphoma in remission, last treated in 2012 who was admitted with acute hypoxemic respiratory failure in the setting of influenza and likely pulmonary edema.     SUBJECTIVE: Remains on FiO2 70, PEEP 12 Milrinone currently 0.25, co-oximetry 82% Propofol 20, phenylephrine 50   VITAL SIGNS: BP 112/73   Pulse 98   Temp 98.3 F (36.8 C) ([name])   Resp (!) 26   Ht _0  (1.803 m)   Wt (!) 160 kg (352 lb 11.8 oz)   SpO2 94%   BMI 49.20 kg/m   HEMODYNAMICS:    VENTILATOR SETTINGS: Vent Mode: PRVC FiO2 (%):  [70 %] 70 % Set Rate:  [24 bmp] 24 bmp Vt Set:  [600 mL] 600 mL PEEP:  [12 cmH20] 12 cmH20 Plateau Pressure:  [15 cmH20-27 cmH20] 15 cmH20 Filed Weights   02/25/17 0444 02/26/17 0427 02/27/17 0500  Weight: (!) 154.5 kg (340 lb 9.8 oz) (!) 157.2 kg (346 lb 9 oz) (!) 160 kg (352 lb 11.8 oz)   INTAKE / OUTPUT:  Intake/Output Summary (Last 24 hours) at 02/27/2017 1329 Last data filed at 02/27/2017 1200 Gross per 24 hour  Intake 4105.14 ml  Output 2330 ml  Net 1775.14 ml    PHYSICAL EXAMINATION: General: Massively obese man, ventilated HEENT: Trach in place, #6 cuffed tracheostomy Pulmonary: Small breaths, decreased both bases, no wheezing, scattered crackles Cardiac: Irregularly irregular, no murmur Abdomen: Obese, soft, positive bowel sounds Extremities/musculoskeletal: No deformities, chronic venous stasis changes bilateral lower extremities Neuro/psych: More calm today, opens eyes, follows commands  LABS:  BMET Recent Labs  Lab 02/25/17 0445 02/26/17 0429 02/27/17 0535  NA 147* 148* 147*  K 2.8* 2.9* 3.5  CL 100* 103 106  CO2 33* 33* 31  BUN 39* 41* 42*  CREATININE 0.95 0.84 0.79  GLUCOSE  157* 156* 217*    Electrolytes Recent Labs  Lab 02/25/17 0445 02/26/17 0429 02/27/17 0535  CALCIUM 8.2* 8.0* 8.1*  MG 2.2 2.2 2.4  PHOS 3.6 3.5 3.1    CBC Recent Labs  Lab 02/25/17 0445 02/26/17 0429 02/27/17 0535  WBC 17.6* 16.4* 11.6*  HGB 13.7 12.4* 11.6*  HCT 43.3 39.9 37.7*  PLT 221 210 218    Coag's No results for input(s): APTT, INR in the last 168 hours.  Sepsis Markers Recent Labs  Lab 02/22/17 1044 02/23/17 0445 02/24/17 0507  PROCALCITON 0.40 0.33 0.51    ABG Recent Labs  Lab 02/24/17 1310 02/25/17 0310 02/26/17 0332  PHART 7.601* 7.494* 7.491*  PCO2ART 37.6 54.8* 46.9  PO2ART 59.3* 73.6* 67.3*    Liver Enzymes No results for input(s): AST, ALT, ALKPHOS, BILITOT, ALBUMIN in the last 168 hours.  Cardiac Enzymes No results for input(s): TROPONINI, PROBNP in the last 168 hours.  Glucose Recent Labs  Lab 02/26/17 1629 02/26/17 1945 02/26/17 2346 02/27/17 0357 02/27/17 0754 02/27/17 1141  GLUCAP 184* 204* 209* 190* 183* 187*    Imaging Dg Chest Port 1 View  Result Date: 02/27/2017 CLINICAL DATA:  Acute respiratory failure EXAM: PORTABLE CHEST 1 VIEW COMPARISON:  Yesterday FINDINGS: Cardiopericardial enlargement. Diffuse airspace opacity with stable pattern from yesterday. No visible effusion or pneumothorax. There is a porta catheter from the left with tip  at the upper cavoatrial junction. Feeding tube at least reaches the diaphragm. Tracheostomy tube in place. IMPRESSION: History of viral pneumonitis and pulmonary edema. Diffuse airspace opacity is unchanged from yesterday. Electronically Signed   By: Monte Fantasia M.D.   On: 02/27/2017 06:52    STUDIES:  2/8 echo > EF 25-30% moderately dilated left ventricle, diffuse hypokinesis mild mitral valve regurgitation, left atrium mildly dilated  CULTURES: 2/7 BCx 2 >> negative 2/7 RVP >> influenza A 2/7 Sputum cx >> normal flora 02/18/2017 BAL>> negative  ANTIBIOTICS: Levaquin 2/3  >2/5; 2/7 x 1 Vancomycin 2/7 >> 2/12 Azactam 2/7 >> 2/12 Tamiflu 2/8 > completed   SIGNIFICANT EVENTS: 2/7 Admit 02/18/2017 intubated for acute respiratory failure  LINES/TUBES: Left chest port >> accessed 2/7 02/18/2017 orotracheal tube>>2/14 02/18/2017 orogastric tube>>2/14 Trach 2/14>>>  DISCUSSION: Obese 62 year old man admitted with respiratory failure due to influenza and possible superimposed bacterial pneumonia.  Also component of cardiogenic pulmonary edema given new discovery of EF 25%, atrial fibrillation.  Currently on milrinone with improvement in his cardiac index, also responded to volume suggestive of some component of over diuresis during the hospitalization.  Remains hypoxemic, continues to have bilateral infiltrates.  Could consider acute pulmonary embolism but has been on heparin drip for atrial fibrillation so this is unlikely.   ASSESSMENT / PLAN:  Acute respiratory failure with hypoxemia in setting of viral pneumonitis (Influenza A) c/b Acute pulmonary edema, failed extubation on 2/14 now trach dependent  Respiratory alkalosis .  Plan Continues to have bilateral infiltrates in the aftermath of influenza, pneumonia.  Suspected significant component of cardiogenic pulmonary edema but he still has high PEEP and FiO2 needs even after volume removal.  Steroid trial initiated on 2/17 Consider possible pulmonary embolism but unlikely as he has been on heparin drip Restart low-dose diuretics on 2/18 Antibiotics and Tamiflu completed Continue noninvasive cardiac output monitoring Trach care Attempt to mobilize as able  Acute systolic HF w/ pulmonary edema (EF 25%) Atrial fib  Suspect that his cardiomyopathy played a role in his failed extubation Plan Continue milrinone, titrate based on noninvasive cardiac index, co-oximetry drip, carvedilol Continue Lipitor, aspirin  Intermittent Fluid and electrolyte imbalance: hypomagnesemia & hypernatremia   Hypokalemia Plan Continue free water, increase 2/18 Replace electrolytes as indicated  Mild thrombocytopenia-->improving History of non-hodgkins lymphoma Plan Follow CBC  Bilateral foot wounds Plan Wound care Foot drop boots Wound care  DM2   Plan Follow CBG, sliding scale insulin per protocol  FAMILY  Discussed with family at bedside on 2/18  DVT prophylaxis: heparin gtt SUP: ppi Diet: tubefeeds Activity: BR Disposition : ICU  independent CC time 34 minutes  Baltazar Apo, MD, PhD 02/27/2017, 1:29 PM [facility] [phone] or if no answer [phone]

## 2017-02-27 NOTE — Consult Note (Addendum)
[facility] Nurse wound consult note Reason for Consult: Consult requested for buttocks and back.  WOC consult was already performed for leg wounds on 2/8. Wound type: Skin to bilat buttocks and back has red mottled rash and red macerated peeling skin; appearance consistent with moisture associated skin damage and candidiasis. Pt has a high BMI and is difficult to turn, and has multiple systemic factors which can impair healing. Measurement: Right posterior shoulder and back with partial thickness skin loss and loose peeling skin; affected area is approx 2X2X.1cm, pink and moist surrounded by loose peeling skin. Bilat buttocks with the same appearance; dark reddish-purple, moist affected area is approx 8X8X.1cm surrounded by loose peeling skin. Dressing procedure/placement/frequency:  Foam dressing to protect and promote healing.  Antifungal powder to back and buttocks.  Pt is on a low airloss bed to reduce pressure.  Discussed plan of care with patient's family at the bedside. Please re-consult if further assistance is needed.  Thank-you,  Julien Girt MSN, [place], [place], [place], [place]

## 2017-02-28 ENCOUNTER — Inpatient Hospital Stay (HOSPITAL_COMMUNITY): Payer: BLUE CROSS/BLUE SHIELD

## 2017-02-28 DIAGNOSIS — Z930 Tracheostomy status: Secondary | ICD-10-CM

## 2017-02-28 DIAGNOSIS — L8990 Pressure ulcer of unspecified site, unspecified stage: Secondary | ICD-10-CM

## 2017-02-28 LAB — POCT I-STAT 3, ART BLOOD GAS (G3+)
Acid-Base Excess: 14 mmol/L — ABNORMAL HIGH (ref 0.0–2.0)
Acid-Base Excess: 11 mmol/L — ABNORMAL HIGH (ref 0.0–2.0)
Bicarbonate: 38.5 mmol/L — ABNORMAL HIGH (ref 20.0–28.0)
Bicarbonate: 35.4 mmol/L — ABNORMAL HIGH (ref 20.0–28.0)
O2 SAT: 95 %
O2 SAT: 90 %
PCO2 ART: 47.1 mmHg (ref 32.0–48.0)
PCO2 ART: 43.8 mmHg (ref 32.0–48.0)
PH ART: 7.52 — AB (ref 7.350–7.450)
PH ART: 7.513 — AB (ref 7.350–7.450)
PO2 ART: 68 mmHg — AB (ref 83.0–108.0)
PO2 ART: 53 mmHg — AB (ref 83.0–108.0)
Patient temperature: 98
Patient temperature: 97.9
TCO2: 40 mmol/L — ABNORMAL HIGH (ref 22–32)
TCO2: 37 mmol/L — ABNORMAL HIGH (ref 22–32)

## 2017-02-28 LAB — HEPARIN LEVEL (UNFRACTIONATED): Heparin Unfractionated: 0.56 IU/mL (ref 0.30–0.70)

## 2017-02-28 LAB — GLUCOSE, CAPILLARY
GLUCOSE-CAPILLARY: 152 mg/dL — AB (ref 65–99)
GLUCOSE-CAPILLARY: 169 mg/dL — AB (ref 65–99)
Glucose-Capillary: 168 mg/dL — ABNORMAL HIGH (ref 65–99)
Glucose-Capillary: 178 mg/dL — ABNORMAL HIGH (ref 65–99)
Glucose-Capillary: 194 mg/dL — ABNORMAL HIGH (ref 65–99)
Glucose-Capillary: 204 mg/dL — ABNORMAL HIGH (ref 65–99)

## 2017-02-28 LAB — CBC
HCT: 37.3 % — ABNORMAL LOW (ref 39.0–52.0)
HEMOGLOBIN: 11.7 g/dL — AB (ref 13.0–17.0)
MCH: 29.8 pg (ref 26.0–34.0)
MCHC: 31.4 g/dL (ref 30.0–36.0)
MCV: 94.9 fL (ref 78.0–100.0)
Platelets: 213 10*3/uL (ref 150–400)
RBC: 3.93 MIL/uL — AB (ref 4.22–5.81)
RDW: 16 % — ABNORMAL HIGH (ref 11.5–15.5)
WBC: 10.6 10*3/uL — ABNORMAL HIGH (ref 4.0–10.5)

## 2017-02-28 LAB — BASIC METABOLIC PANEL
ANION GAP: 12 (ref 5–15)
BUN: 50 mg/dL — ABNORMAL HIGH (ref 6–20)
CALCIUM: 8.1 mg/dL — AB (ref 8.9–10.3)
CO2: 31 mmol/L (ref 22–32)
Chloride: 102 mmol/L (ref 101–111)
Creatinine, Ser: 0.77 mg/dL (ref 0.61–1.24)
GLUCOSE: 199 mg/dL — AB (ref 65–99)
Potassium: 3.2 mmol/L — ABNORMAL LOW (ref 3.5–5.1)
Sodium: 145 mmol/L (ref 135–145)

## 2017-02-28 LAB — COOXEMETRY PANEL
CARBOXYHEMOGLOBIN: 1 % (ref 0.5–1.5)
Carboxyhemoglobin: 1.3 % (ref 0.5–1.5)
METHEMOGLOBIN: 0.7 % (ref 0.0–1.5)
Methemoglobin: 1.1 % (ref 0.0–1.5)
O2 Saturation: 86.9 %
O2 Saturation: 72.7 %
TOTAL HEMOGLOBIN: 13.1 g/dL (ref 12.0–16.0)
Total hemoglobin: 12.1 g/dL (ref 12.0–16.0)

## 2017-02-28 LAB — MAGNESIUM: MAGNESIUM: 2.3 mg/dL (ref 1.7–2.4)

## 2017-02-28 MED ORDER — FUROSEMIDE 10 MG/ML IJ SOLN: 20.0000 mg | Freq: Once | INTRAMUSCULAR | Status: DC

## 2017-02-28 MED ORDER — FUROSEMIDE 10 MG/ML IJ SOLN
20.0000 mg | Freq: Once | INTRAMUSCULAR | Status: AC
  Administered 2017-02-28: 20 mg via INTRAVENOUS
  Filled 2017-02-28: qty 2

## 2017-02-28 MED ORDER — POTASSIUM CHLORIDE 20 MEQ/15ML (10%) PO SOLN
40.0000 meq | Freq: Two times a day (BID) | ORAL | Status: AC
Start: 2017-02-28 — End: 2017-02-28
  Administered 2017-02-28 (×2): 40 meq via ORAL
  Filled 2017-02-28 (×2): qty 30

## 2017-02-28 MED ORDER — FREE WATER
400.0000 mL | Freq: Four times a day (QID) | Status: DC
  Administered 2017-02-28 – 2017-03-01 (×3): 400 mL

## 2017-02-28 NOTE — Progress Notes (Signed)
PULMONARY / CRITICAL CARE MEDICINE   Name: [name] MRN: [id number] DOB: October 18, 1955    ADMISSION DATE:  02/16/2017 CONSULTATION DATE:  02/16/2017  REFERRING MD:  Regenia Skeeter  CHIEF COMPLAINT:  DYspnea  HISTORY OF PRESENT ILLNESS:   62y/o male with a history of lymphoma in remission, last treated in 2012 who was admitted with acute hypoxemic respiratory failure in the setting of influenza and likely pulmonary edema.     SUBJECTIVE: Remains on FiO2 60, PEEP 12 Milrinone currently 0.25, co-oximetry  Propofol 10, phenylephrine off Awake and alert and following commands,calm + 1 L overnight   VITAL SIGNS: BP (!) 164/75   Pulse (!) 112   Temp 97.9 F (36.6 C) (Oral)   Resp (!) 26   Ht _0  (1.803 m)   Wt (!) 363 lb 12.1 oz (165 kg)   SpO2 93%   BMI 50.73 kg/m   HEMODYNAMICS:    VENTILATOR SETTINGS: Vent Mode: PRVC FiO2 (%):  [60 %-70 %] 60 % Set Rate:  [24 bmp] 24 bmp Vt Set:  [600 mL] 600 mL PEEP:  [12 cmH20] 12 cmH20 Plateau Pressure:  [15 cmH20-28 cmH20] 28 cmH20 Filed Weights   02/26/17 0427 02/27/17 0500 02/28/17 0500  Weight: (!) 346 lb 9 oz (157.2 kg) (!) 352 lb 11.8 oz (160 kg) (!) 363 lb 12.1 oz (165 kg)   INTAKE / OUTPUT:  Intake/Output Summary (Last 24 hours) at 02/28/2017 6606 Last data filed at 02/28/2017 0900 Gross per 24 hour  Intake 3639.6 ml  Output 2625 ml  Net 1014.6 ml    PHYSICAL EXAMINATION: General: Massively obese man, ventilated, awake and alert HEENT: Trach in place, #6 cuffed tracheostomy, Cot Trac Pulmonary: Small breaths, decreased both bases, + wheezing, scattered crackles Cardiac: Irregularly irregular, no murmur Abdomen: Obese, soft, positive bowel sounds, distended Extremities/musculoskeletal: No deformities, chronic venous stasis changes bilateral lower extremities Neuro/psych: Calm, opens eyes, follows commands, minimal sedation  LABS:  BMET Recent Labs  Lab 02/26/17 0429 02/27/17 0535 02/28/17 0529  NA 148* 147*  145  K 2.9* 3.5 3.2*  CL 103 106 102  CO2 33* 31 31  BUN 41* 42* 50*  CREATININE 0.84 0.79 0.77  GLUCOSE 156* 217* 199*    Electrolytes Recent Labs  Lab 02/25/17 0445 02/26/17 0429 02/27/17 0535 02/28/17 0529  CALCIUM 8.2* 8.0* 8.1* 8.1*  MG 2.2 2.2 2.4 2.3  PHOS 3.6 3.5 3.1  --     CBC Recent Labs  Lab 02/26/17 0429 02/27/17 0535 02/28/17 0529  WBC 16.4* 11.6* 10.6*  HGB 12.4* 11.6* 11.7*  HCT 39.9 37.7* 37.3*  PLT 210 218 213    Coag's No results for input(s): APTT, INR in the last 168 hours.  Sepsis Markers Recent Labs  Lab 02/22/17 1044 02/23/17 0445 02/24/17 0507  PROCALCITON 0.40 0.33 0.51    ABG Recent Labs  Lab 02/24/17 1310 02/25/17 0310 02/26/17 0332  PHART 7.601* 7.494* 7.491*  PCO2ART 37.6 54.8* 46.9  PO2ART 59.3* 73.6* 67.3*    Liver Enzymes No results for input(s): AST, ALT, ALKPHOS, BILITOT, ALBUMIN in the last 168 hours.  Cardiac Enzymes No results for input(s): TROPONINI, PROBNP in the last 168 hours.  Glucose Recent Labs  Lab 02/27/17 1141 02/27/17 1523 02/27/17 2001 02/27/17 2324 02/28/17 0402 02/28/17 0744  GLUCAP 187* 182* 203* 208* 204* 194*    Imaging Dg Chest Port 1 View  Result Date: 02/28/2017 CLINICAL DATA:  Acute respiratory failure EXAM: PORTABLE CHEST 1 VIEW COMPARISON:  02/27/2017  FINDINGS: Support devices are stable. Patchy severe bilateral airspace disease again noted, unchanged. Low lung volumes. Mild cardiomegaly. IMPRESSION: Low lung volumes with bilateral airspace opacities. No significant change. Electronically Signed   By: Rolm Baptise M.D.   On: 02/28/2017 07:51    STUDIES:  2/8 echo > EF 25-30% moderately dilated left ventricle, diffuse hypokinesis mild mitral valve regurgitation, left atrium mildly dilated  CULTURES: 2/7 BCx 2 >> negative 2/7 RVP >> influenza A 2/7 Sputum cx >> normal flora 02/18/2017 BAL>> negative  ANTIBIOTICS: Levaquin 2/3 >2/5; 2/7 x 1 Vancomycin 2/7 >>  2/12 Azactam 2/7 >> 2/12 Tamiflu 2/8 > completed   SIGNIFICANT EVENTS: 2/7 Admit 02/18/2017 intubated for acute respiratory failure  LINES/TUBES: Left chest port >> accessed 2/7 02/18/2017 orotracheal tube>>2/14 02/18/2017 orogastric tube>>2/14 Trach 2/14>>>  DISCUSSION: Obese 62 year old man admitted with respiratory failure due to influenza and possible superimposed bacterial pneumonia.  Also component of cardiogenic pulmonary edema given new discovery of EF 25%, atrial fibrillation.  Currently on milrinone with improvement in his cardiac index, also responded to volume suggestive of some component of over diuresis during the hospitalization.  Remains hypoxemic, continues to have bilateral infiltrates.  Could consider acute pulmonary embolism but has been on heparin drip for atrial fibrillation so this is unlikely.   ASSESSMENT / PLAN:  Acute respiratory failure with hypoxemia in setting of viral pneumonitis (Influenza A) c/b Acute pulmonary edema, failed extubation on 2/14 now trach dependent  Respiratory alkalosis . CXR 2/19>> Patchy severe bilateral airspace disease again noted, unchanged. Low lung volumes. Mild cardiomegaly.  Plan Continues to have bilateral infiltrates in the aftermath of influenza, pneumonia.  Suspected significant component of cardiogenic pulmonary edema but he still has high PEEP and FiO2 needs even after volume removal.   Plan CPAP PS trials when able/ appropriate Steroid trial initiated on 2/17 Consider possible pulmonary embolism but unlikely as he has been on heparin drip Restart low-dose diuretics on 2/18 Antibiotics and Tamiflu completed Continue noninvasive cardiac output monitoring Trach care Attempt to mobilize as able  Acute systolic HF w/ pulmonary edema (EF 25%) Atrial fib  Suspect that his cardiomyopathy played a role in his failed extubation Plan Continue milrinone, titrate based on noninvasive cardiac index, co-oximetry drip,  carvedilol Continue Lipitor, aspirin MAP goal > 65 Tele EKG prn  Intermittent Fluid and electrolyte imbalance: hypomagnesemia & hypernatremia  Hypokalemia Plan Continue free water, increase 2/18 Replace electrolytes as indicated Trend BMET daily  Mild thrombocytopenia-->improving History of non-hodgkins lymphoma Plan Follow CBC Monitor for bleeding  Bilateral foot wounds Plan Wound care Foot drop boots Wound care Culture as is clinically indicated  DM2   Plan Follow CBG, sliding scale insulin per protocol  FAMILY  Updated family at bedside 219  DVT prophylaxis: heparin gtt SUP: ppi Diet: tubefeeds Activity: BR Disposition : ICU  Magdalen Spatz, AGACNP-BC 02/28/2017, 9:42 AM [facility] [phone]

## 2017-02-28 NOTE — Progress Notes (Signed)
ANTICOAGULATION CONSULT NOTE - Follow Up  Pharmacy Consult:  Heparin Indication: atrial fibrillation  Allergies  Allergen Reactions  . Penicillins Swelling    Age 62 or 32 - pt states he had swelling all over and his lips got real big   Patient Measurements: Height: 5\' 11"  (180.3 cm) Weight: (!) 363 lb 12.1 oz (165 kg) IBW/kg (Calculated) : 75.3 Heparin Dosing Weight: 117 kg  Vital Signs: Temp: 97.8 F (36.6 C) (02/19 0403) Temp Source: Oral (02/19 0403) BP: 118/78 (02/19 0600) Pulse Rate: 95 (02/19 0600)  Labs: Recent Labs    02/26/17 0429 02/27/17 0535 02/27/17 0601 02/28/17 0529  HGB 12.4* 11.6*  --  11.7*  HCT 39.9 37.7*  --  37.3*  PLT 210 218  --  213  HEPARINUNFRC 0.37  --  0.34 0.56  CREATININE 0.84 0.79  --  0.77    Assessment: 62 yo M with new Afib started on IV heparin.   Now s/p trach  Heparin is therapeutic at 0.56 on 2300 units/hr. CBC is stable. No signs of bleeding have been noted.   Goal of Therapy:  Heparin level 0.3-0.7 units/ml Monitor platelets by anticoagulation protocol: Yes  Plan:  Continue heparin at 2300 units/hr Daily heparin level and CBC Monitor for signs/symptoms of bleeding  F/U long-term anticoagulation plans  Jimmy Footman, PharmD, BCPS PGY2 Infectious Diseases Pharmacy Resident Pager: [phone]  02/28/2017 7:15 AM

## 2017-02-28 NOTE — Progress Notes (Signed)
Physical Therapy Treatment Patient Details Name: [name] MRN: [id number] DOB: 1955/05/01 Today's Date: 02/28/2017    History of Present Illness Pt is a 62 y/o male history of lymphoma in remission admitted with acute on chronic hypoxemic and hypercapnic respiratory failure due to influenza A.  He was also noted to have a new LVEF of 25% with contribution of cardiogenic edema.  He failed extubation on 2/14.  Based on his need for continued ventilatory support he underwent percutaneous tracheostomy on 2/14.    PT Comments    Pt alert today and visibly excited to get to sit EOB. Followed all commands and was able to participate in SL to sit. Though still required max A +2 to get to EOB due to body habitus. Pt denied dizziness in sitting and VSS. Performed seated there ex EOB before returning to supine. PT will continue to follow.    Follow Up Recommendations  LTACH;Other (comment)(vs SNF based on vent needs)     Equipment Recommendations  None recommended by PT    Recommendations for Other Services       Precautions / Restrictions Precautions Precautions: Fall Precaution Comments: trach, vent, NG Restrictions Weight Bearing Restrictions: No    Mobility  Bed Mobility Overal bed mobility: Needs Assistance Bed Mobility: Rolling;Sidelying to Sit;Sit to Supine Rolling: Max assist;+2 for physical assistance Sidelying to sit: Max assist;+2 for physical assistance   Sit to supine: Max assist;+2 for physical assistance   General bed mobility comments: pt able to grasp rail with hand over hand assist. Max A for LE's off bed and facilitation of trunk into sitting. Pt activating trunk for SL to sit. As well as managing upper body with return to supine.   Transfers                    Ambulation/Gait                 Stairs            Wheelchair Mobility    Modified Rankin (Stroke Patients Only)       Balance Overall balance assessment: Needs  assistance Sitting-balance support: Single extremity supported;Feet unsupported Sitting balance-Leahy Scale: Poor Sitting balance - Comments: mod A to maintain sitting but pt able to lean fwd with cues and maintain with min A for short period of time. Sat EOB >10 mins Postural control: Posterior lean                                  Cognition Arousal/Alertness: Awake/alert Behavior During Therapy: WFL for tasks assessed/performed Overall Cognitive Status: Difficult to assess                                 General Comments: trach but pt trying to mouth words and followed all commands and remained alert throughout session. Was visibly very excited to get to sit up      Exercises General Exercises - Lower Extremity Ankle Circles/Pumps: AROM;Both;20 reps;Supine Long Arc Quad: AROM;Both;10 reps;Seated    General Comments General comments (skin integrity, edema, etc.): pt denied dizziness, VSS in supine and sitting      Pertinent Vitals/Pain Pain Assessment: No/denies pain    Home Living                      Prior Function  PT Goals (current goals can now be found in the care plan section) Acute Rehab PT Goals Patient Stated Goal: unable to state (trach/vent) PT Goal Formulation: With patient Time For Goal Achievement: 03/10/17 Potential to Achieve Goals: Fair Progress towards PT goals: Progressing toward goals    Frequency    Min 2X/week      PT Plan Current plan remains appropriate    Co-evaluation              AM-PAC PT "6 Clicks" Daily Activity  Outcome Measure  Difficulty turning over in bed (including adjusting bedclothes, sheets and blankets)?: Unable Difficulty moving from lying on back to sitting on the side of the bed? : Unable Difficulty sitting down on and standing up from a chair with arms (e.g., wheelchair, bedside commode, etc,.)?: Unable Help needed moving to and from a bed to chair (including  a wheelchair)?: Total Help needed walking in hospital room?: Total Help needed climbing 3-5 steps with a railing? : Total 6 Click Score: 6    End of Session   Activity Tolerance: Patient tolerated treatment well Patient left: in bed;with call bell/phone within reach;with family/visitor present Nurse Communication: Mobility status PT Visit Diagnosis: Other abnormalities of gait and mobility (R26.89)     Time: 5366-4403 PT Time Calculation (min) (ACUTE ONLY): 23 min  Charges:  $Therapeutic Activity: 23-37 mins                    G Codes:       Leighton Roach, PT  Acute Rehab Services  [place] 02/28/2017, 10:01 AM

## 2017-02-28 NOTE — Progress Notes (Deleted)
PULMONARY / CRITICAL CARE MEDICINE   Name: [name] MRN: [id number] DOB: 1955/05/20    ADMISSION DATE:  02/16/2017 CONSULTATION DATE:  02/16/2017  REFERRING MD:  Regenia Skeeter  CHIEF COMPLAINT:  DYspnea  HISTORY OF PRESENT ILLNESS:   62y/o male with a history of lymphoma in remission, last treated in 2012 who was admitted with acute hypoxemic respiratory failure in the setting of influenza and likely pulmonary edema.     SUBJECTIVE: Weaning on CPAP PS 2/19 am 5/8 Sedation off Pressors off Following commands + 1L last 24. T Max 98.7   VITAL SIGNS: BP (!) 164/75   Pulse 93   Temp 97.9 F (36.6 C) (Oral)   Resp (!) 26   Ht _0  (1.803 m)   Wt (!) 363 lb 12.1 oz (165 kg)   SpO2 95%   BMI 50.73 kg/m   HEMODYNAMICS:    VENTILATOR SETTINGS: Vent Mode: PRVC FiO2 (%):  [60 %-70 %] 60 % Set Rate:  [24 bmp] 24 bmp Vt Set:  [600 mL] 600 mL PEEP:  [12 cmH20] 12 cmH20 Plateau Pressure:  [15 cmH20-28 cmH20] 28 cmH20 Filed Weights   02/26/17 0427 02/27/17 0500 02/28/17 0500  Weight: (!) 346 lb 9 oz (157.2 kg) (!) 352 lb 11.8 oz (160 kg) (!) 363 lb 12.1 oz (165 kg)   INTAKE / OUTPUT:  Intake/Output Summary (Last 24 hours) at 02/28/2017 0910 Last data filed at 02/28/2017 0800 Gross per 24 hour  Intake 3545.8 ml  Output 2625 ml  Net 920.8 ml    PHYSICAL EXAMINATION: General: Massively obese man, weaning on CPAP 5/8 HEENT: Trach in place, #6 cuffed tracheostomy, Cor Track Pulmonary: Coarse throughout, diminished per bases, few crackles Cardiac: Irregularly irregular, no RMG Abdomen: Obese, slightly distendedsoft, positive bowel sounds Extremities/musculoskeletal: No deformities, chronic venous stasis changes bilateral lower extremities, 3 + edema hands bilaterally Neuro/psych: More calm today, opens eyes, follows commands, nodding yes to simple questions  LABS:  BMET Recent Labs  Lab 02/26/17 0429 02/27/17 0535 02/28/17 0529  NA 148* 147* 145  K 2.9* 3.5 3.2*  CL  103 106 102  CO2 33* 31 31  BUN 41* 42* 50*  CREATININE 0.84 0.79 0.77  GLUCOSE 156* 217* 199*    Electrolytes Recent Labs  Lab 02/25/17 0445 02/26/17 0429 02/27/17 0535 02/28/17 0529  CALCIUM 8.2* 8.0* 8.1* 8.1*  MG 2.2 2.2 2.4 2.3  PHOS 3.6 3.5 3.1  --     CBC Recent Labs  Lab 02/26/17 0429 02/27/17 0535 02/28/17 0529  WBC 16.4* 11.6* 10.6*  HGB 12.4* 11.6* 11.7*  HCT 39.9 37.7* 37.3*  PLT 210 218 213    Coag's No results for input(s): APTT, INR in the last 168 hours.  Sepsis Markers Recent Labs  Lab 02/22/17 1044 02/23/17 0445 02/24/17 0507  PROCALCITON 0.40 0.33 0.51    ABG Recent Labs  Lab 02/24/17 1310 02/25/17 0310 02/26/17 0332  PHART 7.601* 7.494* 7.491*  PCO2ART 37.6 54.8* 46.9  PO2ART 59.3* 73.6* 67.3*    Liver Enzymes No results for input(s): AST, ALT, ALKPHOS, BILITOT, ALBUMIN in the last 168 hours.  Cardiac Enzymes No results for input(s): TROPONINI, PROBNP in the last 168 hours.  Glucose Recent Labs  Lab 02/27/17 1141 02/27/17 1523 02/27/17 2001 02/27/17 2324 02/28/17 0402 02/28/17 0744  GLUCAP 187* 182* 203* 208* 204* 194*    Imaging Dg Chest Port 1 View  Result Date: 02/28/2017 CLINICAL DATA:  Acute respiratory failure EXAM: PORTABLE CHEST 1 VIEW COMPARISON:  02/27/2017 FINDINGS: Support devices are stable. Patchy severe bilateral airspace disease again noted, unchanged. Low lung volumes. Mild cardiomegaly. IMPRESSION: Low lung volumes with bilateral airspace opacities. No significant change. Electronically Signed   By: Rolm Baptise M.D.   On: 02/28/2017 07:51    STUDIES:  2/8 echo > EF 25-30% moderately dilated left ventricle, diffuse hypokinesis mild mitral valve regurgitation, left atrium mildly dilated  CULTURES: 2/7 BCx 2 >> negative 2/7 RVP >> influenza A 2/7 Sputum cx >> normal flora 02/18/2017 BAL>> negative  ANTIBIOTICS: Levaquin 2/3 >2/5; 2/7 x 1 Vancomycin 2/7 >> 2/12 Azactam 2/7 >> 2/12 Tamiflu 2/8  > completed   SIGNIFICANT EVENTS: 2/7 Admit 02/18/2017 intubated for acute respiratory failure  LINES/TUBES: Left chest port >> accessed 2/7 02/18/2017 orotracheal tube>>2/14 02/18/2017 orogastric tube>>2/14 Trach 2/14>>>  DISCUSSION: Obese 62 year old man admitted with respiratory failure due to influenza and possible superimposed bacterial pneumonia.  Also component of cardiogenic pulmonary edema given new discovery of EF 25%, atrial fibrillation.  Was on milrinone with improvement in his cardiac index, also responded to volume suggestive of some component of over diuresis during the hospitalization.  Weaning on CPAP/PS off sedation and pressors. Lung volumes are low. Will need eventual TC trials with goal of discharge to LTAC.  ASSESSMENT / PLAN:  Acute respiratory failure with hypoxemia in setting of viral pneumonitis (Influenza A) c/b Acute pulmonary edema, failed extubation on 2/14 now trach dependent  Respiratory alkalosis .  Plan Continues to have bilateral infiltrates in the aftermath of influenza, pneumonia.  Suspected significant component of cardiogenic pulmonary edema but he still has high PEEP and FiO2 needs even after volume removal.   Plan: Steroid trial initiated on 2/17 Consider possible pulmonary embolism but unlikely as he has been on heparin drip Restart low-dose diuretics on 2/18 Antibiotics and Tamiflu completed CPAP/PS trials as able ( 5/8) Progress to TC as able Trach care Attempt to mobilize as able ( PT dangled on bed 2/19) Minimize sedation   Acute systolic HF w/ pulmonary edema (EF 25%) Atrial fib  Suspect that his cardiomyopathy played a role in his failed extubation Pressors off Milrinone off + fluid balance last 24 Plan Lasix 20 x 1 dose Continue Lipitor, aspirin, coreg, lasix Tele monitoring Maintain MAP > 65 EKG prn  Intermittent Fluid and electrolyte imbalance: hypomagnesemia & hypernatremia  Hypokalemia Plan Continue free water,  increase 2/18 Replace electrolytes as indicated Trend BMET Monitor UO  Mild thrombocytopenia-->improving History of non-hodgkins lymphoma Plan Follow CBC Transfuse for HGB < 7 Monitor for active bleeding  Bilateral foot wounds Plan Wound care Foot drop boots Wound care Culture if clinically indicated  DM2   Plan Follow CBG, sliding scale insulin per protocol  FAMILY  No family at bedside 2/19.   DVT prophylaxis: heparin gtt SUP: ppi Diet: tubefeeds Activity: Per PT, dangled 2/19 Disposition : ICU   02/28/2017, 9:10 AM  Magdalen Spatz, AGACNP-BC [facility] [phone]

## 2017-02-28 NOTE — Progress Notes (Signed)
Independently examined pt, evaluated data & formulated above care plan with NP/resident   62 year old obese man with a history of lymphoma in remission, admitted 2/7 with ARDS due to influenza pneumonia requiring tracheostomy 2/14  He is off pressors, on continuous IV sedation with propofol, remains on 60%/PEEP of 12 Small amount of liquid stool on tube feeds 1+ bipedal edema, awake and follows commands  Chest x-ray personally reviewed, shows bilateral airspace disease Labs show mild hypokalemia leukocytosis has improved.  Impression/plan ARDS/prolonged mechanical ventilation -continue to wean FiO2 and once down to less than 50% can taper PEEP down. Was given trial of steroids x 72h  but we will not continue this  Continue to diurese his renal function and blood pressure permits. Completed antibiotics and Tamiflu  Acute systolic heart failure-EF 25%, decrease milrinone to 0.25 and aim for co-ox around 70%  Steroid-induced hyperglycemia-continue SSI, expect to improve once steroids stopped  Anticipate will need LTAC but hopefully will eventually liberate from the ventilator  The patient is critically ill with multiple organ systems failure and requires high complexity decision making for assessment and support, frequent evaluation and titration of therapies, application of advanced monitoring technologies and extensive interpretation of multiple databases. Critical Care Time devoted to patient care services described in this note independent of APP/resident  time is 35 minutes.    Leanna Sato Elsworth Soho MD

## 2017-03-01 ENCOUNTER — Inpatient Hospital Stay (HOSPITAL_COMMUNITY): Payer: BLUE CROSS/BLUE SHIELD

## 2017-03-01 LAB — CBC
HCT: 38.1 % — ABNORMAL LOW (ref 39.0–52.0)
HEMOGLOBIN: 12 g/dL — AB (ref 13.0–17.0)
MCH: 30.1 pg (ref 26.0–34.0)
MCHC: 31.5 g/dL (ref 30.0–36.0)
MCV: 95.5 fL (ref 78.0–100.0)
Platelets: 185 10*3/uL (ref 150–400)
RBC: 3.99 MIL/uL — ABNORMAL LOW (ref 4.22–5.81)
RDW: 15.2 % (ref 11.5–15.5)
WBC: 10.3 10*3/uL (ref 4.0–10.5)

## 2017-03-01 LAB — GLUCOSE, CAPILLARY
GLUCOSE-CAPILLARY: 187 mg/dL — AB (ref 65–99)
GLUCOSE-CAPILLARY: 211 mg/dL — AB (ref 65–99)
GLUCOSE-CAPILLARY: 154 mg/dL — AB (ref 65–99)
Glucose-Capillary: 132 mg/dL — ABNORMAL HIGH (ref 65–99)
Glucose-Capillary: 147 mg/dL — ABNORMAL HIGH (ref 65–99)
Glucose-Capillary: 157 mg/dL — ABNORMAL HIGH (ref 65–99)

## 2017-03-01 LAB — BASIC METABOLIC PANEL
ANION GAP: 12 (ref 5–15)
BUN: 51 mg/dL — ABNORMAL HIGH (ref 6–20)
CALCIUM: 7.9 mg/dL — AB (ref 8.9–10.3)
CO2: 30 mmol/L (ref 22–32)
Chloride: 98 mmol/L — ABNORMAL LOW (ref 101–111)
Creatinine, Ser: 0.66 mg/dL (ref 0.61–1.24)
GFR calc Af Amer: >60 mL/min (ref >60)
GFR calc non Af Amer: >60 mL/min (ref >60)
Glucose, Bld: 166 mg/dL — ABNORMAL HIGH (ref 65–99)
Potassium: 3.6 mmol/L (ref 3.5–5.1)
Sodium: 140 mmol/L (ref 135–145)

## 2017-03-01 LAB — COOXEMETRY PANEL
CARBOXYHEMOGLOBIN: 1.5 % (ref 0.5–1.5)
Carboxyhemoglobin: 1 % (ref 0.5–1.5)
METHEMOGLOBIN: 1.5 % (ref 0.0–1.5)
Methemoglobin: 1.2 % (ref 0.0–1.5)
O2 Saturation: 78.6 %
O2 Saturation: 63.8 %
Total hemoglobin: 11.5 g/dL — ABNORMAL LOW (ref 12.0–16.0)
Total hemoglobin: 13.3 g/dL (ref 12.0–16.0)

## 2017-03-01 LAB — POCT I-STAT 3, ART BLOOD GAS (G3+)
ACID-BASE EXCESS: 7 mmol/L — AB (ref 0.0–2.0)
BICARBONATE: 30.9 mmol/L — AB (ref 20.0–28.0)
O2 SAT: 89 %
PH ART: 7.497 — AB (ref 7.350–7.450)
PO2 ART: 49 mmHg — AB (ref 83.0–108.0)
Patient temperature: 96.8
TCO2: 32 mmol/L (ref 22–32)
pCO2 arterial: 39.5 mmHg (ref 32.0–48.0)

## 2017-03-01 LAB — MAGNESIUM: MAGNESIUM: 2.4 mg/dL (ref 1.7–2.4)

## 2017-03-01 LAB — HEPARIN LEVEL (UNFRACTIONATED): Heparin Unfractionated: 0.7 IU/mL (ref 0.30–0.70)

## 2017-03-01 MED ORDER — MORPHINE SULFATE (PF) 2 MG/ML IV SOLN
1.0000 mg | INTRAVENOUS | Status: DC | PRN
  Administered 2017-03-01: 2 mg via INTRAVENOUS
  Administered 2017-03-01: 1 mg via INTRAVENOUS
  Administered 2017-03-02 – 2017-03-05 (×3): 2 mg via INTRAVENOUS
  Filled 2017-03-01 (×5): qty 1

## 2017-03-01 MED ORDER — CLONAZEPAM 0.1 MG/ML ORAL SUSPENSION: 0.5000 mg | Freq: Every day | ORAL | Status: DC

## 2017-03-01 MED ORDER — PROPOFOL 1000 MG/100ML IV EMUL
0.0000 ug/kg/min | INTRAVENOUS | Status: DC
  Administered 2017-03-01: 15 ug/kg/min via INTRAVENOUS

## 2017-03-01 MED ORDER — CLONAZEPAM 0.5 MG PO TABS
0.5000 mg | ORAL_TABLET | Freq: Every day | ORAL | Status: DC
  Administered 2017-03-01 – 2017-03-04 (×4): 0.5 mg
  Filled 2017-03-01 (×4): qty 1

## 2017-03-01 MED ORDER — IPRATROPIUM-ALBUTEROL 0.5-2.5 (3) MG/3ML IN SOLN
3.0000 mL | Freq: Three times a day (TID) | RESPIRATORY_TRACT | Status: DC
  Administered 2017-03-01 – 2017-03-14 (×39): 3 mL via RESPIRATORY_TRACT
  Filled 2017-03-01 (×39): qty 3

## 2017-03-01 MED ORDER — POTASSIUM CHLORIDE 20 MEQ/15ML (10%) PO SOLN
20.0000 meq | ORAL | Status: AC
  Administered 2017-03-01 (×2): 20 meq
  Filled 2017-03-01 (×2): qty 15

## 2017-03-01 MED ORDER — FUROSEMIDE 10 MG/ML IJ SOLN
20.0000 mg | Freq: Once | INTRAMUSCULAR | Status: AC
  Administered 2017-03-01: 20 mg via INTRAVENOUS
  Filled 2017-03-01: qty 2

## 2017-03-01 MED ORDER — FREE WATER
300.0000 mL | Freq: Four times a day (QID) | Status: DC
  Administered 2017-03-01 – 2017-03-07 (×21): 300 mL

## 2017-03-01 NOTE — Progress Notes (Signed)
Pt with frequent coughing while awake on vent. Now coughing up bloody sputum. Katy NP with CCM notified and order given to stop heparin. Pt otherwise stable. 02 sats 91% on 50% fio2 and peep of 8. Nursing to carry out orders and continue to monitor.

## 2017-03-01 NOTE — Progress Notes (Signed)
PULMONARY / CRITICAL CARE MEDICINE   Name: [name] MRN: [id number] DOB: Jun 13, 1955    ADMISSION DATE:  02/16/2017 CONSULTATION DATE:  02/16/2017  REFERRING MD:  Regenia Skeeter  CHIEF COMPLAINT:  DYspnea  HISTORY OF PRESENT ILLNESS:   62y/o male with a history of lymphoma in remission, last treated in 2012 who was admitted with acute hypoxemic respiratory failure in the setting of influenza and likely pulmonary edema.     SUBJECTIVE: Vent needs improved.  Awake, smiling at girlfriend.  Communicates. Down to peep 5.  Propofol off.    VITAL SIGNS: BP (!) 182/84   Pulse 80   Temp (!) 96.8 F (36 C) (Axillary)   Resp (!) 25   Ht _0  (1.803 m)   Wt (!) 168.7 kg (371 lb 14.7 oz)   SpO2 92%   BMI 51.87 kg/m   HEMODYNAMICS:    VENTILATOR SETTINGS: Vent Mode: PRVC FiO2 (%):  [50 %-60 %] 50 % Set Rate:  [14 bmp-24 bmp] 14 bmp Vt Set:  [600 mL] 600 mL PEEP:  [5 cmH20-12 cmH20] 5 cmH20 Plateau Pressure:  [15 cmH20-32 cmH20] 20 cmH20 Filed Weights   02/27/17 0500 02/28/17 0500 03/01/17 0439  Weight: (!) 160 kg (352 lb 11.8 oz) (!) 165 kg (363 lb 12.1 oz) (!) 168.7 kg (371 lb 14.7 oz)   INTAKE / OUTPUT:  Intake/Output Summary (Last 24 hours) at 03/01/2017 3614 Last data filed at 03/01/2017 0800 Gross per 24 hour  Intake 2993.13 ml  Output 1690 ml  Net 1303.13 ml    PHYSICAL EXAMINATION: General: Massively obese man, ventilated, awake and alert HEENT: Trach in place, #6 cuffed tracheostomy, Cot Trac Pulmonary: resps even non labored on full support, coarse, diminished bases, few scattered wheezes  Cardiac: Irregularly irregular, no murmur Abdomen: Obese, soft, positive bowel sounds, distended Extremities/musculoskeletal: No deformities, chronic venous stasis changes bilateral lower extremities, 1-2+ BLE edema  Neuro/psych: Calm, opens eyes, follows commands, minimal sedation  LABS:  BMET Recent Labs  Lab 02/27/17 0535 02/28/17 0529 03/01/17 0456  NA 147* 145 140   K 3.5 3.2* 3.6  CL 106 102 98*  CO2 _1 BUN 42* 50* 51*  CREATININE 0.79 0.77 0.66  GLUCOSE 217* 199* 166*    Electrolytes Recent Labs  Lab 02/25/17 0445 02/26/17 0429 02/27/17 0535 02/28/17 0529 03/01/17 0456  CALCIUM 8.2* 8.0* 8.1* 8.1* 7.9*  MG 2.2 2.2 2.4 2.3 2.4  PHOS 3.6 3.5 3.1  --   --     CBC Recent Labs  Lab 02/27/17 0535 02/28/17 0529 03/01/17 0456  WBC 11.6* 10.6* 10.3  HGB 11.6* 11.7* 12.0*  HCT 37.7* 37.3* 38.1*  PLT 218 213 185    Coag's No results for input(s): APTT, INR in the last 168 hours.  Sepsis Markers Recent Labs  Lab 02/22/17 1044 02/23/17 0445 02/24/17 0507  PROCALCITON 0.40 0.33 0.51    ABG Recent Labs  Lab 02/26/17 0332 02/28/17 1442 02/28/17 1628  PHART 7.491* 7.520* 7.513*  PCO2ART 46.9 47.1 43.8  PO2ART 67.3* 68.0* 53.0*    Liver Enzymes No results for input(s): AST, ALT, ALKPHOS, BILITOT, ALBUMIN in the last 168 hours.  Cardiac Enzymes No results for input(s): TROPONINI, PROBNP in the last 168 hours.  Glucose Recent Labs  Lab 02/28/17 1147 02/28/17 1531 02/28/17 1958 02/28/17 2353 03/01/17 0350 03/01/17 0801  GLUCAP 178* 152* 169* 168* 157* 187*    Imaging Dg Chest Port 1 View  Result Date: 03/01/2017 CLINICAL DATA:  Respiratory failure EXAM: PORTABLE CHEST 1 VIEW COMPARISON:  02/28/2017 FINDINGS: Cardiac shadow is enlarged but stable. Tracheostomy tube and feeding catheter are again seen. Left-sided chest wall port and right-sided PICC line are noted as well. Patchy infiltrates are again identified bilaterally and stable. No new focal abnormality is seen. IMPRESSION: Stable patchy infiltrates bilaterally. Tubes and lines as described. Electronically Signed   By: Inez Catalina M.D.   On: 03/01/2017 07:16    STUDIES:  2/8 echo > EF 25-30% moderately dilated left ventricle, diffuse hypokinesis mild mitral valve regurgitation, left atrium mildly dilated  CULTURES: 2/7 BCx 2 >> negative 2/7 RVP  >> influenza A 2/7 Sputum cx >> normal flora 02/18/2017 BAL>> negative  ANTIBIOTICS: Levaquin 2/3 >2/5; 2/7 x 1 Vancomycin 2/7 >> 2/12 Azactam 2/7 >> 2/12 Tamiflu 2/8 > completed   SIGNIFICANT EVENTS: 2/7 Admit 02/18/2017 intubated for acute respiratory failure  LINES/TUBES: Left chest port >> accessed 2/7 02/18/2017 orotracheal tube>>2/14 02/18/2017 orogastric tube>>2/14 Trach 2/14>>>  DISCUSSION: Obese 62 year old man admitted with respiratory failure due to influenza and possible superimposed bacterial pneumonia.  Also component of cardiogenic pulmonary edema given new discovery of EF 25%, atrial fibrillation.  Currently on milrinone with improvement in his cardiac index, also responded to volume suggestive of some component of over diuresis during the hospitalization.  Remains hypoxemic, continues to have bilateral infiltrates.  Could consider acute pulmonary embolism but has been on heparin drip for atrial fibrillation so this is unlikely.   ASSESSMENT / PLAN:  Acute respiratory failure with hypoxemia in setting of viral pneumonitis (Influenza A) c/b Acute pulmonary edema.  Failed extubation on 2/14 now s/p trach.  Vent needs improving slowly.  Persistent bilateral infiltrates.  Respiratory alkalosis .  Plan Vent support - weaning peep/FiO2  F/u CXR  F/u ABG  Can likely start PS trial in am if vent needs remain reasonable  Steroid trial started 2/17 - no sig improvement, stopped 2/19 Continue gentle diuresis as tol  See cards  S/p full course abx and tamiflu  D/c aline if vent needs remain improved    Acute systolic HF w/ pulmonary edema (EF 25%) Atrial fib  Suspect that his cardiomyopathy played a role in his failed extubation Plan Continue milrinone - consider wean off over next 24 hours - co-ox 78 this am  Lasix 63m IV x 1 again today  Continue heparin gtt, ASA, lipitor  Continue carvedilol  consider heart failure team input  May need additional rx for HTN -  monitor    Intermittent Fluid and electrolyte imbalance: hypomagnesemia & hypernatremia  Hypokalemia Plan Continue free water - decrease to [id number] Replace electrolytes as indicated Trend BMET daily  Mild thrombocytopenia-->improving History of non-hodgkins lymphoma Plan Follow CBC Monitor for bleeding  Bilateral foot wounds Plan Wound care Foot drop boots Wound care Culture as is clinically indicated  DM2   Plan Follow CBG, sliding scale insulin per protocol  AMS - resolved.  Plan -  Continue melatonin qhs  Will add low dose benzo at night and PRN  [facility]  Updated family at bedside 2/20   Discussed at length with girlfriend at bedside 2/20.  Also discussed at length with case management.  Vent needs improved, can likely start wean over next 24 hours.  Will need to assess how he does with weaning and discuss plan.  May benefit from LTAC.    DVT prophylaxis: heparin gtt SUP: ppi Diet: tubefeeds Activity: BR Disposition : ICU  Nickolas Madrid, NP 03/01/2017  9:11 AM Pager: [phone] or [phone]

## 2017-03-01 NOTE — Progress Notes (Signed)
[facility] ADULT ICU REPLACEMENT PROTOCOL FOR AM LAB REPLACEMENT ONLY  The patient does apply for the [facility] Adult ICU Electrolyte Replacment Protocol based on the criteria listed below:   1. Is GFR >/= 40 ml/min? Yes.    Patient's GFR today is >60 2. Is urine output >/= 0.5 ml/kg/hr for the last 6 hours? Yes.   Patient's UOP is .57 ml/kg/hr 3. Is BUN < 60 mg/dL? Yes.    Patient's BUN today is 51 4. Abnormal electrolyte(s): 3.6 5. Ordered repletion with: per protocol 6. If a panic level lab has been reported, has the CCM MD in charge been notified? Yes.  .   Physician:  Dr. Durward Parcel, Philis Nettle 03/01/2017 6:39 AM

## 2017-03-01 NOTE — Clinical Social Work Note (Signed)
Clinical Social Work Assessment  Patient Details  Name: [name] MRN: [id number] Date of Birth: 1955-05-29  Date of referral:  03/01/17               Reason for consult:  Facility Placement(vent/snf. )                Permission sought to share information with:  Family Supports Permission granted to share information::  Yes, Verbal Permission Granted  Name::     Comptroller   Agency::  family  Relationship::  Oncologist Information:  Margaretmary Eddy [phone]  Housing/Transportation Living arrangements for the past 2 months:  Single Family Home(alone. ) Source of Information:  Siblings(Renee Bunton ) Patient Interpreter Needed:  None Criminal Activity/Legal Involvement Pertinent to Current Situation/Hospitalization:  No - Comment as needed Significant Relationships:  Adult Children, Friend Lives with:  Self Do you feel safe going back to the place where you live?  Yes Need for family participation in patient care:  Yes (Comment)  Care giving concerns:  CSW spoke with pt's sister via phone. At this time sister expresses no further concerns to CSW as she is just wanting what is best and safest for pt at this time.    Social Worker assessment / plan:  CSW spoke with pt's sister for this assessment and pt is intubated at this time. Pt's sister was pleasant and willing to provide any needed information to CSW at this time. CSW was informed that pt is from home alone. Per pt's sister pt was working up until this incident happened which led pt to the hospital to began with. CSW was informed that pt has a Chartered certified accountant as well as a friend Advertising copywriter) that help pt is pt is ever needing help with anything. Per sister, she is understanding that pt may require a facility that can meet pt's need if still needing the vent at the time of discharge. Pt's sister is understanding and gave CSW permission to began the process of faxing out just in case LTACH is unable to take pt.   Employment  status:  [employer] information:  Other (Comment Required)(BCBS) PT Recommendations:  LTAC(or vent/snf depending upon needs. ) Information / Referral to community resources:     Patient/Family's Response to care:  Pt's sister appeared to be understanding and agreeable to plan of care at this time.   Patient/Family's Understanding of and Emotional Response to Diagnosis, Current Treatment, and Prognosis:  No further questions or concerns have been presented to CSW at this time.   Emotional Assessment Appearance:  Appears stated age Attitude/Demeanor/Rapport:  Unable to Assess Affect (typically observed):  Unable to Assess Orientation:  (intubated unable to assess. ) Alcohol / Substance use:  Not Applicable Psych involvement (Current and /or in the community):  No (Comment)  Discharge Needs  Concerns to be addressed:  No discharge needs identified, Denies Needs/Concerns at this time Readmission within the last 30 days:  No Current discharge risk:  Dependent with Mobility, Lives alone Barriers to Discharge:  Continued Medical Work up, Other(unsure as to if pt will need LTACH or Vent/snf at the time of discharge. )   Wetzel Bjornstad, [place] 03/01/2017, 7:46 AM

## 2017-03-01 NOTE — Procedures (Signed)
Removal of Trach Site Sutures  Patient Details:   Name: [name] DOB: Dec 23, 1955 MRN: [id number]    Airway Documentation: Sutures removed from trach site per order. Patient has excessive brown drainage and dried blood around trach site and a possible wound beginning under the trach flange. Trach site cleaned well and dried blood removed. Pink foam dressing placed under trach flange to protect the skin and drainage sponge placed under trach. Trach tie changed and secured properly. Inner cannula changed.     Evaluation  O2 sats: stable throughout Complications: No apparent complications Patient did tolerate procedure well. Bilateral Breath Sounds: Diminished    [name] 03/01/2017, 10:30 AM

## 2017-03-01 NOTE — [place] FL2 (Signed)
[place] MEDICAID FL2 LEVEL OF CARE SCREENING TOOL     IDENTIFICATION  Patient Name: [name] Birthdate: 08-06-1955 Sex: male Admission Date (Current Location): 02/16/2017  [facility] and [place] Number:  Herbalist and Address:  [facility] [place], [place] [address], [place], [place] [address]      Provider Number: [id number]  Attending Physician Name and Address:  Juanito Doom, MD  Relative Name and Phone Number:       Current Level of Care: Hospital Recommended Level of Care: Other (Comment)(LTACH or Vent/snf depending upon vent needs. ) Prior Approval Number:    Date Approved/Denied:   PASRR Number:   [id number]   Discharge Plan: Other (Comment)(LTACH or vent/snf.)    Current Diagnoses: Patient Active Problem List   Diagnosis Date Noted  . Pressure injury of skin 02/28/2017  . Acute respiratory failure with hypoxemia ([place])   . Status post tracheostomy ([place])   . Acute respiratory failure ([place])   . Respiratory failure, unspecified with hypoxia ([place]) 02/16/2017  . Follicular lymphoma grade II of intrathoracic lymph nodes ([place]) 05/02/2016  . Portacath in place 05/04/2015  . Gout attack 01/16/2012  . Follicular lymphoma grade II ([place]) 12/16/2010    Orientation RESPIRATION BLADDER Height & Weight     (intubated at this time. )  Vent Continent Weight: (!) 371 lb 14.7 oz (168.7 kg) Height:  _0  (180.3 cm)  BEHAVIORAL SYMPTOMS/MOOD NEUROLOGICAL BOWEL NUTRITION STATUS      Incontinent Diet(please see discharge summary. )  AMBULATORY STATUS COMMUNICATION OF NEEDS Skin   Extensive Assist   Surgical wounds(trach site. )                       Personal Care Assistance Level of Assistance  Bathing, Feeding, Dressing Bathing Assistance: Maximum assistance Feeding assistance: Maximum assistance Dressing Assistance: Maximum assistance     Functional Limitations Info  Sight, Hearing, Speech Sight Info: Adequate Hearing Info:  Adequate Speech Info: Impaired(pt has a trach that was placed on 02/23/17.)    SPECIAL CARE FACTORS FREQUENCY  PT (By licensed PT), OT (By licensed OT), Speech therapy     PT Frequency: 5 times a week  OT Frequency: 5 times a week      Speech Therapy Frequency: 5 times a week       Contractures Contractures Info: Not present    Additional Factors Info  Code Status, Allergies Code Status Info: Full Allergies Info: : Penicillins           Current Medications (03/01/2017):  This is the current hospital active medication list Current Facility-Administered Medications  Medication Dose Route Frequency Provider Last Rate Last Dose  . 0.9 %  sodium chloride infusion   Intra-arterial PRN Erick Colace, NP      . acetaminophen (TYLENOL) tablet 650 mg  650 mg Per Tube Q6H PRN Delane Ginger, RPH   650 mg at 02/28/17 1237  . aspirin chewable tablet 81 mg  81 mg Per Tube Daily Erick Colace, NP   81 mg at 02/28/17 1034  . atorvastatin (LIPITOR) tablet 20 mg  20 mg Per Tube q1800 Erick Colace, NP   20 mg at 02/28/17 1748  . carvedilol (COREG) tablet 12.5 mg  12.5 mg Per Tube BID WC Erick Colace, NP   12.5 mg at 02/28/17 1645  . chlorhexidine gluconate (MEDLINE KIT) (PERIDEX) 0.12 % solution 15 mL  15 mL Mouth Rinse  BID Rush Farmer, MD   15 mL at 02/28/17 1947  . Chlorhexidine Gluconate Cloth 2 % PADS 6 each  6 each Topical Daily Juanito Doom, MD   6 each at 02/28/17 1036  . feeding supplement (PRO-STAT SUGAR FREE 64) liquid 60 mL  60 mL Per Tube TID Collene Gobble, MD   60 mL at 02/28/17 2132  . feeding supplement (VITAL HIGH PROTEIN) liquid 1,000 mL  1,000 mL Per Tube Continuous Collene Gobble, MD 50 mL/hr at 03/01/17 0600 1,000 mL at 03/01/17 0600  . fentaNYL (SUBLIMAZE) injection 25-100 mcg  25-100 mcg Intravenous Q2H PRN Rigoberto Noel, MD   100 mcg at 02/28/17 2133  . free water 400 mL  400 mL Per Tube Q6H Diallo, Abdoulaye, MD   400 mL at 03/01/17 0654  .  guaiFENesin (ROBITUSSIN) 100 MG/5ML solution 200 mg  10 mL Oral Q6H McQuaid, Douglas B, MD   200 mg at 03/01/17 0500  . heparin ADULT infusion 100 units/mL (25000 units/25m sodium chloride 0.45%)  2,200 Units/hr Intravenous Continuous SSusa Raring RPH 22 mL/hr at 03/01/17 0746 2,200 Units/hr at 03/01/17 0746  . insulin aspart (novoLOG) injection 0-15 Units  0-15 Units Subcutaneous Q4H BCollene Gobble MD   3 Units at 03/01/17 0410  . ipratropium-albuterol (DUONEB) 0.5-2.5 (3) MG/3ML nebulizer solution 3 mL  3 mL Nebulization Q4H PRN SJennelle HumanB, NP      . MEDLINE mouth rinse  15 mL Mouth Rinse QID YRush Farmer MD   15 mL at 03/01/17 0400  . Melatonin TABS 6 mg  6 mg Oral QHS MSimonne MaffucciB, MD   6 mg at 02/28/17 2133  . milrinone (PRIMACOR) 20 MG/100 ML (0.2 mg/mL) infusion  0.125 mcg/kg/min Intravenous Continuous AKara MeadV, MD 5.8 mL/hr at 03/01/17 0600 0.125 mcg/kg/min at 03/01/17 0600  . mupirocin cream (BACTROBAN) 2 %   Topical Daily Hammonds, KSharyn Blitz MD      . ondansetron ([facility] injection 4 mg  4 mg Intravenous Q6H PRN SMauri Brooklyn MD   4 mg at 02/18/17 06160 . pantoprazole sodium (PROTONIX) 40 mg/20 mL oral suspension 40 mg  40 mg Per Tube Q1200 BErick Colace NP   40 mg at 02/28/17 1234  . potassium chloride 20 MEQ/15ML (10%) solution 20 mEq  20 mEq Per Tube Q4H SMauri Brooklyn MD   20 mEq at 03/01/17 0654  . propofol (DIPRIVAN) 1000 MG/100ML infusion  0-50 mcg/kg/min Intravenous Continuous SSusa Raring RPH 15.2 mL/hr at 03/01/17 0747 15 mcg/kg/min at 03/01/17 0747  . sodium chloride flush (NS) 0.9 % injection 10-40 mL  10-40 mL Intracatheter PRN GSherwood Gambler MD      . sodium chloride flush (NS) 0.9 % injection 10-40 mL  10-40 mL Intracatheter Q12H MJuanito Doom MD   Stopped at 02/28/17 2134  . sodium chloride flush (NS) 0.9 % injection 10-40 mL  10-40 mL Intracatheter PRN MSimonne MaffucciB, MD      . sodium phosphate (FLEET) 7-19 GM/118ML  enema 1 enema  1 enema Rectal Once BErick Colace NP       Facility-Administered Medications Ordered in Other Encounters  Medication Dose Route Frequency Provider Last Rate Last Dose  . sodium chloride 0.9 % injection 10 mL  10 mL Intravenous PRN MCurt Bears MD   10 mL at 05/04/15 1529     Discharge Medications: Please see discharge summary for a list of discharge medications.  Relevant Imaging Results:  Relevant Lab Results:   Additional Information [id number]. Pt had trach placed on 02/23/17. Shiley 14m cuffed.   KWetzel Bjornstad LCSWA

## 2017-03-01 NOTE — Progress Notes (Signed)
ANTICOAGULATION CONSULT NOTE - Follow Up  Pharmacy Consult:  Heparin Indication: atrial fibrillation  Allergies  Allergen Reactions  . Penicillins Swelling    Age 62 or 45 - pt states he had swelling all over and his lips got real big   Patient Measurements: Height: 5\' 11"  (180.3 cm) Weight: (!) 371 lb 14.7 oz (168.7 kg) IBW/kg (Calculated) : 75.3 Heparin Dosing Weight: 117 kg  Vital Signs: Temp: 97.5 F (36.4 C) (02/20 0351) Temp Source: Oral (02/20 0351) BP: 126/84 (02/20 0600) Pulse Rate: 83 (02/20 0600)  Labs: Recent Labs    02/27/17 0535 02/27/17 0601 02/28/17 0529 03/01/17 0456  HGB 11.6*  --  11.7* 12.0*  HCT 37.7*  --  37.3* 38.1*  PLT 218  --  213 185  HEPARINUNFRC  --  0.34 0.56 0.70  CREATININE 0.79  --  0.77 0.66    Assessment: 62 yo M with new Afib started on IV heparin.   Now s/p trach  Heparin is towards the upper end of therapeutic at 0.70 on 2300 units/hr. H/H stable and platelets are down slightly. No signs of bleeding have been noted.   Goal of Therapy:  Heparin level 0.3-0.7 units/ml Monitor platelets by anticoagulation protocol: Yes  Plan:  Decrease heparin to  2200 units/hr to prevent supratherapeutic level Daily heparin level and CBC Monitor for signs/symptoms of bleeding  F/U long-term anticoagulation plans  Jimmy Footman, PharmD, BCPS PGY2 Infectious Diseases Pharmacy Resident Pager: [phone]  03/01/2017 7:44 AM

## 2017-03-01 NOTE — Progress Notes (Addendum)
9:36am- CSW spoke with pt's sister again and informed her that information has been sent out at this time. CSW also reiterated to sister again that if pt weans from vent, then pt may not need a facility that will accomodate vents at that time. Sister expressed understanding and gratefulness toward CSW for helping as needed. CSW still following at this time.   CSW see that pt is being recommended for LTACH, however depending upon vent needs pt may be in need of vent/snf placement. CSW spoke with pt's sister [name]) and got permission to fax pt out to vent/snf's at this time. CSW will continue to follow for needs.      [name], MSW, [facility] Clinical Social Worker [phone]

## 2017-03-02 DIAGNOSIS — I5021 Acute systolic (congestive) heart failure: Secondary | ICD-10-CM

## 2017-03-02 DIAGNOSIS — I4891 Unspecified atrial fibrillation: Secondary | ICD-10-CM

## 2017-03-02 LAB — GLUCOSE, CAPILLARY
GLUCOSE-CAPILLARY: 110 mg/dL — AB (ref 65–99)
GLUCOSE-CAPILLARY: 106 mg/dL — AB (ref 65–99)
Glucose-Capillary: 112 mg/dL — ABNORMAL HIGH (ref 65–99)
Glucose-Capillary: 128 mg/dL — ABNORMAL HIGH (ref 65–99)
Glucose-Capillary: 141 mg/dL — ABNORMAL HIGH (ref 65–99)
Glucose-Capillary: 143 mg/dL — ABNORMAL HIGH (ref 65–99)

## 2017-03-02 LAB — MAGNESIUM: Magnesium: 2.3 mg/dL (ref 1.7–2.4)

## 2017-03-02 LAB — BASIC METABOLIC PANEL
Anion gap: 12 (ref 5–15)
BUN: 52 mg/dL — AB (ref 6–20)
CALCIUM: 8.4 mg/dL — AB (ref 8.9–10.3)
CO2: 30 mmol/L (ref 22–32)
CREATININE: 0.72 mg/dL (ref 0.61–1.24)
Chloride: 104 mmol/L (ref 101–111)
GFR calc Af Amer: >60 mL/min (ref >60)
GFR calc non Af Amer: >60 mL/min (ref >60)
GLUCOSE: 134 mg/dL — AB (ref 65–99)
Potassium: 3.4 mmol/L — ABNORMAL LOW (ref 3.5–5.1)
Sodium: 146 mmol/L — ABNORMAL HIGH (ref 135–145)

## 2017-03-02 LAB — CBC
HCT: 40.9 % (ref 39.0–52.0)
Hemoglobin: 12.8 g/dL — ABNORMAL LOW (ref 13.0–17.0)
MCH: 29.4 pg (ref 26.0–34.0)
MCHC: 31.3 g/dL (ref 30.0–36.0)
MCV: 93.8 fL (ref 78.0–100.0)
PLATELETS: 193 10*3/uL (ref 150–400)
RBC: 4.36 MIL/uL (ref 4.22–5.81)
RDW: 16 % — AB (ref 11.5–15.5)
WBC: 10.4 10*3/uL (ref 4.0–10.5)

## 2017-03-02 LAB — HEPARIN LEVEL (UNFRACTIONATED): Heparin Unfractionated: <0.1 IU/mL — ABNORMAL LOW (ref 0.30–0.70)

## 2017-03-02 MED ORDER — HEPARIN (PORCINE) IN NACL 100-0.45 UNIT/ML-% IJ SOLN
2050.0000 [IU]/h | INTRAMUSCULAR | Status: AC
  Administered 2017-03-02: 2050 [IU]/h via INTRAVENOUS
  Filled 2017-03-02: qty 250

## 2017-03-02 MED ORDER — LOSARTAN POTASSIUM 25 MG PO TABS
25.0000 mg | ORAL_TABLET | Freq: Every day | ORAL | Status: DC
  Administered 2017-03-02 – 2017-03-03 (×2): 25 mg
  Filled 2017-03-02 (×2): qty 1

## 2017-03-02 MED ORDER — HYDRALAZINE HCL 20 MG/ML IJ SOLN: 10.0000 mg | INTRAMUSCULAR | Status: DC | PRN

## 2017-03-02 MED ORDER — APIXABAN 5 MG PO TABS
5.0000 mg | ORAL_TABLET | Freq: Two times a day (BID) | ORAL | Status: DC
  Administered 2017-03-02 – 2017-03-03 (×2): 5 mg via ORAL
  Filled 2017-03-02 (×2): qty 1

## 2017-03-02 MED ORDER — FUROSEMIDE 40 MG PO TABS: 40.0000 mg | ORAL_TABLET | Freq: Two times a day (BID) | ORAL | Status: DC

## 2017-03-02 MED ORDER — FUROSEMIDE 20 MG PO TABS
20.0000 mg | ORAL_TABLET | Freq: Every day | ORAL | Status: DC
  Filled 2017-03-02: qty 1

## 2017-03-02 MED ORDER — POTASSIUM CHLORIDE 20 MEQ/15ML (10%) PO SOLN
20.0000 meq | ORAL | Status: AC
  Administered 2017-03-02 (×2): 20 meq
  Filled 2017-03-02 (×2): qty 15

## 2017-03-02 MED ORDER — FUROSEMIDE 40 MG PO TABS
40.0000 mg | ORAL_TABLET | Freq: Two times a day (BID) | ORAL | Status: DC
  Administered 2017-03-02 – 2017-03-07 (×11): 40 mg
  Filled 2017-03-02 (×15): qty 1

## 2017-03-02 MED ORDER — AMLODIPINE 1 MG/ML ORAL SUSPENSION
5.0000 mg | Freq: Every day | ORAL | Status: DC
  Filled 2017-03-02: qty 5

## 2017-03-02 MED ORDER — LOSARTAN POTASSIUM 25 MG PO TABS
25.0000 mg | ORAL_TABLET | Freq: Every day | ORAL | Status: DC
  Filled 2017-03-02: qty 1

## 2017-03-02 MED ORDER — LOSARTAN POTASSIUM 25 MG PO TABS: 25.0000 mg | ORAL_TABLET | Freq: Every day | ORAL | Status: DC

## 2017-03-02 MED ORDER — AMLODIPINE BESYLATE 5 MG PO TABS
5.0000 mg | ORAL_TABLET | Freq: Every day | ORAL | Status: DC
  Administered 2017-03-02 – 2017-03-21 (×20): 5 mg
  Filled 2017-03-02 (×22): qty 1

## 2017-03-02 MED ORDER — CARVEDILOL 25 MG PO TABS
25.0000 mg | ORAL_TABLET | Freq: Two times a day (BID) | ORAL | Status: DC
  Administered 2017-03-02 – 2017-03-21 (×36): 25 mg
  Filled 2017-03-02 (×39): qty 1

## 2017-03-02 NOTE — Progress Notes (Signed)
CSW received call from Daphene Calamity with [facility] and was informed that they are not able to take pt due to pt's weight.    Virgie Dad Kanijah Groseclose, MSW, [facility] Clinical Social Worker [phone]

## 2017-03-02 NOTE — Care Management Note (Signed)
Case Management Note  Patient Details  Name: [name] MRN: [id number] Date of Birth: 11/28/1955  Subjective/Objective:   Pt admitted with flu and pulm edema            Action/Plan:   PTA independent from home alone.  Pt currently intubated.    Expected Discharge Date:                  Expected Discharge Plan:  Home/Self Care  In-House Referral:     Discharge planning Services  CM Consult  Post Acute Care Choice:    Choice offered to:     DME Arranged:    DME Agency:     HH Arranged:    HH Agency:     Status of Service:     If discussed at [employer] of Stay Meetings, dates discussed:    Additional Comments: 03/02/2017  [facility] referral given by attending - physician advisor in agreement.  Pt is alert and oriented - pt given choice of [facility] - both agencies met with pt at bedside.  Pt states his significant other has been in contact with pts sister - pt declined for CM to reach out to sister.  Pt chose [facility] - agency starting insurance [employer] is aware of pts choice   02/24/2017 Pt self extubated and was immediately re intubated/  Pt is now 1 day s/p trach - still requiring vent.  Pt has increased WOB with agonal breaths, plan is to increase sedation and supplemental oxygen Maryclare Labrador, RN 03/02/2017, 4:07 PM

## 2017-03-02 NOTE — Progress Notes (Signed)
ANTICOAGULATION CONSULT NOTE - Follow Up Consult  Pharmacy Consult for heparin  Indication: atrial fibrillation  Allergies  Allergen Reactions  . Penicillins Swelling    Age 62 or 76 - pt states he had swelling all over and his lips got real big    Patient Measurements: Height: 5\' 11"  (180.3 cm) Weight: (!) 370 lb 2.4 oz (167.9 kg) IBW/kg (Calculated) : 75.3 Heparin Dosing Weight: 117 kg  Vital Signs: Temp: 97.7 F (36.5 C) (02/21 1157) Temp Source: Oral (02/21 1157) BP: 174/79 (02/21 1125) Pulse Rate: 91 (02/21 1125)  Labs: Recent Labs    02/28/17 0529 03/01/17 0456 03/02/17 0443  HGB 11.7* 12.0* 12.8*  HCT 37.3* 38.1* 40.9  PLT 213 185 193  HEPARINUNFRC 0.56 0.70 <0.10*  CREATININE 0.77 0.66 0.72    Estimated Creatinine Clearance: 154 mL/min (by C-G formula based on SCr of 0.72 mg/dL).    Assessment: 62 year old male with new atrial fibrillation on heparin. Heparin was stopped yesterday due to bloody sputum. No blood reported today per RN. CBC is stable. We will re-start heparin today with no bolus.   Goal of Therapy:  Heparin level 0.3-0.7 units/ml Monitor platelets by anticoagulation protocol: Yes   Plan:  Re-start heparin at 2050 units/hr (Previously therapeutic on this dose) No bolus with recent bleeding 6 hour heparin level Daily heparin level/CBC Monitor for further s/sx of bleeding   Jimmy Footman, PharmD, BCPS PGY2 Infectious Diseases Pharmacy Resident Pager: [phone]  03/02/2017,12:08 PM

## 2017-03-02 NOTE — Progress Notes (Signed)
[facility] ADULT ICU REPLACEMENT PROTOCOL FOR AM LAB REPLACEMENT ONLY  The patient does apply for the [facility] Adult ICU Electrolyte Replacment Protocol based on the criteria listed below:   1. Is GFR >/= 40 ml/min? Yes.    Patient's GFR today is >60 2. Is urine output >/= 0.5 ml/kg/hr for the last 6 hours? Yes.   Patient's UOP is .54 ml/kg/hr 3. Is BUN < 60 mg/dL? Yes.    Patient's BUN today is 52 4. Abnormal electrolyte(s): K- 3.4 5. Ordered repletion with: per protocol 6. If a panic level lab has been reported, has the CCM MD in charge been notified? Yes.  .   Physician:  Dr. Carver Fila, Philis Nettle 03/02/2017 6:13 AM

## 2017-03-02 NOTE — Progress Notes (Signed)
Physical Therapy Treatment Patient Details Name: [name] MRN: [id number] DOB: 1955-11-11 Today's Date: 03/02/2017    History of Present Illness Pt is a 62 y/o male history of lymphoma in remission admitted with acute on chronic hypoxemic and hypercapnic respiratory failure due to influenza A.  He was also noted to have a new LVEF of 25% with contribution of cardiogenic edema.  He failed extubation on 2/14.  Based on his need for continued ventilatory support he underwent percutaneous tracheostomy on 2/14.    PT Comments    Pt admitted with above diagnosis. Pt currently with functional limitations due to balance and endurance deficits.  Pt was able to sit EOB >10 min utes with mod to max assist with pt leaning posteriorly and left when fatigued. Pt gives a lot of effort.  Will continue to progress.    Pt will benefit from skilled PT to increase their independence and safety with mobility to allow discharge to the venue listed below.     Follow Up Recommendations  LTACH;Other (comment)(vs SNF based on vent needs)     Equipment Recommendations  None recommended by PT    Recommendations for Other Services       Precautions / Restrictions Precautions Precautions: Fall Precaution Comments: trach, vent, NG Restrictions Weight Bearing Restrictions: No    Mobility  Bed Mobility Overal bed mobility: Needs Assistance Bed Mobility: Rolling;Sidelying to Sit;Sit to Supine Rolling: Max assist;+2 for physical assistance Sidelying to sit: Max assist;+2 for physical assistance   Sit to supine: Max assist;+2 for physical assistance   General bed mobility comments: pt able to grasp rail with hand over hand assist. Max A for LE's off bed and facilitation of trunk into sitting. Pt activating trunk for SL to sit. As well as managing upper body with return to supine. Requires +3 to scoot pt back up in bed.   Transfers                    Ambulation/Gait                  Stairs            Wheelchair Mobility    Modified Rankin (Stroke Patients Only)       Balance Overall balance assessment: Needs assistance Sitting-balance support: Single extremity supported;Feet unsupported Sitting balance-Leahy Scale: Poor Sitting balance - Comments: mod A to maintain sitting but pt able to lean fwd with cues and maintain with min A for short period of time. Sat EOB >10 mins.  Pt using left UE for support with heavy left lean at times when fatigued Postural control: Posterior lean;Left lateral lean                                  Cognition Arousal/Alertness: Awake/alert Behavior During Therapy: WFL for tasks assessed/performed Overall Cognitive Status: Difficult to assess                                 General Comments: trach but pt trying to mouth words and followed all commands and remained alert throughout session. Was visibly very excited to get to sit up      Exercises General Exercises - Lower Extremity Ankle Circles/Pumps: AROM;Both;20 reps;Supine Quad Sets: AROM;Both;10 reps;Supine Gluteal Sets: AROM;Both;10 reps;Supine Long Arc Quad: AROM;Both;10 reps;Seated Heel Slides: AROM;Both;10 reps;Supine Hip ABduction/ADduction: AAROM;Both;10  reps;Supine    General Comments General comments (skin integrity, edema, etc.): Pt on 50% FiO2 and PEEP 8.  VSS with activity       Pertinent Vitals/Pain Faces Pain Scale: No hurt    Home Living                      Prior Function            PT Goals (current goals can now be found in the care plan section) Acute Rehab PT Goals Patient Stated Goal: unable to state (trach/vent) Progress towards PT goals: Progressing toward goals    Frequency    Min 2X/week      PT Plan Current plan remains appropriate    Co-evaluation              AM-PAC PT "6 Clicks" Daily Activity  Outcome Measure  Difficulty turning over in bed (including adjusting  bedclothes, sheets and blankets)?: Unable Difficulty moving from lying on back to sitting on the side of the bed? : Unable Difficulty sitting down on and standing up from a chair with arms (e.g., wheelchair, bedside commode, etc,.)?: Unable Help needed moving to and from a bed to chair (including a wheelchair)?: Total Help needed walking in hospital room?: Total Help needed climbing 3-5 steps with a railing? : Total 6 Click Score: 6    End of Session Equipment Utilized During Treatment: Other (comment)(vent) Activity Tolerance: Patient tolerated treatment well;Patient limited by fatigue Patient left: in bed;with call bell/phone within reach;with family/visitor present Nurse Communication: Mobility status;Need for lift equipment PT Visit Diagnosis: Other abnormalities of gait and mobility (R26.89)     Time: 2902-1115 PT Time Calculation (min) (ACUTE ONLY): 33 min  Charges:  $Therapeutic Exercise: 8-22 mins $Therapeutic Activity: 8-22 mins                    G Codes:       Nikky Duba,PT Acute Rehabilitation [phone] (pager)    Denice Paradise 03/02/2017, 2:02 PM

## 2017-03-02 NOTE — Consult Note (Addendum)
Cardiology Consultation:   Patient ID: [name]; [id number]; [birth year]   Admit date: 02/16/2017 Date of Consult: 03/02/2017  Primary Care Provider: [name], MD Primary Cardiologist: New-Maigen Mozingo   Patient Profile:   [name] is a 62 y.o. male with a hx of Non-Hodgkin's lymphoma stage III (last treated with chemo and radiation in 2012, now in remission), HTN, HLD, scoliosis, PVD, chronic bilateral foot wounds and gout who was admitted by [facility] service with ARDS secondary to influenza A who is being seen today for the evaluation of new onset acute systolic heart failure with an EF of 25-30% at the request of Dr. Elsworth Soho.   History of Present Illness:   Information was obtained from chart review as the pt has tracheostomy in place and is on the ventilator. According to H&P note from admission on 02/16/17, Mr. [name] is a 62yo M who reported progressive swelling in bilateral lower legs and abdomen for several weeks which is abnormal for him. On Tuesday 02/11/17, he became acutely worse with fever, chills, shortness of breath, generalized weakness and cough with blood-tinged sputum. He was seen in an urgent care center and was diagnosed with bilateral pneumonia and placed on Levaquin.  He took 3 days of Levaquin in which he reports initially helped, however he began to feel worse once again. He was seen once again seen at the urgent care several days later and was given a steroid shot and it was recommended that he go to the hospital, however he refused at that time.  On 02/16/17 the patient presented to the emergency department by EMS and was found to be hypoxic with O2 saturations in the 80s and was placed on BiPAP ventilation. He was treated with nebulizers and Solu-Medrol. An EKG was performed which she was found to be in new onset A. Fib and was therefore placed on Hep gtt for anticoagulation.  Additionally, a troponin level was performed which was abnormal at 0.08, however had no c/o chest  pain during this entire event. His BNP on arrival was 251, his white count was 2.9, platelets 91, and elevated LFT's (AST-115, ALT-66).  Chest x-ray was performed which was concerning for multifocal pneumonia and possible pulmonary edema.  He was treated for acute sepsis and given 1.5 L of fluid for lactic acid of 1.96.  PCCM was consulted for admission and he was transferred to the ICU.  On 02/17/17, one day after admission, an echocardiogram was performed which showed an LVEF of 25% to 30% with diffuse hypokinesis. It does not appear that he has ever had a cardiology evaluation of any kind in the past. Unfortunately on 02/18/17 the patient was intubated due to respiratory failure secondary to ARDS. He has since been aggressively treated with antibiotics for bacterial PNA secondary to Flu A and diuretics for new acute systolic heart failure.  He was placed on a heparin drip (atrial fibrillation), Coreg, ASA and Lipitor was started. On 02/24/17 the patient had signs of hypovolemic shock secondary to overt diuresis, and therefore diuretics were stopped and a milrinone drip was started.  As of 02/26/17 the patient had improvement in his cardiac index on milrinone, and was also responsive to volume, suggestive of some component of over diuresis.  Most recently patient is off of pressors, continuous IV sedation, and he remains on 60% FiO2 per trach.  His milrinone has now been weaned off and diuretics have been reinitiated (Lasix 20 mg IV as needed). The process has been initiated  for placement in an LTAC facility. Please see plan below.    Past Medical History:  Diagnosis Date  . Dyslipidemia   . Gout attack 01/16/2012  . Hypertension   . nhl dx'd 01/2010  . Venous insufficiency     Past Surgical History:  Procedure Laterality Date  . BRONCHOSCOPY    . ESOPHAGOGASTRODUODENOSCOPY ENDOSCOPY     Dr Jearld Fenton  . INGUINAL HERNIA REPAIR     2001  . PORT-A-CATH REMOVAL       Prior to Admission medications     Medication Sig Start Date End Date Taking? Authorizing Provider  allopurinol (ZYLOPRIM) 300 MG tablet Take 300 mg by mouth daily. 04/03/16  Yes [provider]  amLODipine (NORVASC) 5 MG tablet Take 5 mg by mouth daily.     Yes [provider]  atorvastatin (LIPITOR) 20 MG tablet Take 20 mg by mouth daily.  10/11/14  Yes [provider]  BENICAR HCT 40-25 MG per tablet Take 1 tablet by mouth daily.  02/05/11  Yes [provider]  carvedilol (COREG) 12.5 MG tablet Take 12.5 mg by mouth 2 (two) times daily with a meal.  01/26/13  Yes [name], MD  Cholecalciferol (VITAMIN D-3 PO) Take 1 tablet by mouth daily. '500mg'$    Yes [provider]  CONTRAVE 8-90 MG TB12 Take 2 tablets by mouth 2 (two) times daily. 10/06/15  Yes [provider]  Ibuprofen (ADVIL) 200 MG CAPS Take 400 mg by mouth as needed (pain).    Yes [provider]  levofloxacin (LEVAQUIN) 750 MG tablet Take 750 mg by mouth daily. x7 days. 02/13/17  Yes [provider]  multivitamin-iron-minerals-folic acid (CENTRUM) chewable tablet Chew 1 tablet by mouth daily.   Yes [provider]  Omega-3 Fatty Acids (FISH OIL PO) Take 1 tablet by mouth daily.     Yes [provider]  ULORIC 40 MG tablet Take 40 mg by mouth daily. 10/28/16  Yes [provider]    Inpatient Medications: Scheduled Meds: . amLODipine  5 mg Per Tube Daily  . aspirin  81 mg Per Tube Daily  . atorvastatin  20 mg Per Tube q1800  . carvedilol  12.5 mg Per Tube BID WC  . chlorhexidine gluconate (MEDLINE KIT)  15 mL Mouth Rinse BID  . Chlorhexidine Gluconate Cloth  6 each Topical Daily  . clonazePAM  0.5 mg Per Tube QHS  . feeding supplement (PRO-STAT SUGAR FREE 64)  60 mL Per Tube TID  . free water  300 mL Per Tube Q6H  . insulin aspart  0-15 Units Subcutaneous Q4H  . ipratropium-albuterol  3 mL Nebulization TID  . mouth rinse  15 mL Mouth Rinse QID  . Melatonin  6 mg  Oral QHS  . mupirocin cream   Topical Daily  . pantoprazole sodium  40 mg Per Tube Q1200  . sodium chloride flush  10-40 mL Intracatheter Q12H   Continuous Infusions: . sodium chloride    . feeding supplement (VITAL HIGH PROTEIN) 1,000 mL (03/01/17 1936)  . heparin 2,050 Units/hr (03/02/17 1300)   PRN Meds: Place/Maintain arterial line **AND** sodium chloride, acetaminophen, fentaNYL (SUBLIMAZE) injection, hydrALAZINE, ipratropium-albuterol, morphine injection, ondansetron (ZOFRAN) IV, sodium chloride flush, sodium chloride flush  Allergies:    Allergies  Allergen Reactions  . Penicillins Swelling    Age 80 or 51 - pt states he had swelling all over and his lips got real big    Social History:   Social History  Socioeconomic History  . Marital status: Divorced    Spouse name: Not on file  . Number of children: Not on file  . Years of education: Not on file  . Highest education level: Not on file  Social Needs  . Financial resource strain: Not on file  . Food insecurity - worry: Not on file  . Food insecurity - inability: Not on file  . Transportation needs - medical: Not on file  . Transportation needs - non-medical: Not on file  Occupational History  . Not on file  Tobacco Use  . Smoking status: Former Smoker    Packs/day: 0.50    Years: 20.00    Pack years: 10.00    Last attempt to quit: 01/11/2012    Years since quitting: 5.1  . Smokeless tobacco: Never Used  Substance and Sexual Activity  . Alcohol use: No  . Drug use: No  . Sexual activity: Not on file  Other Topics Concern  . Not on file  Social History Narrative  . Not on file    Family History:   Family History  Problem Relation Age of Onset  . Colon cancer Neg Hx   . Pancreatic cancer Neg Hx   . Stomach cancer Neg Hx   . Rectal cancer Neg Hx    Family Status:  No family status information on file.    ROS:  Please see the history of present illness.  All other ROS reviewed and negative.      Physical Exam/Data:   Vitals:   03/02/17 1400 03/02/17 1500 03/02/17 1539 03/02/17 1601  BP: (!) 154/84 (!) 153/104 (!) 153/104   Pulse: 98 90 100   Resp: 20 (!) 22 (!) 24   Temp:    97.9 F (36.6 C)  TempSrc:    Oral  SpO2: (!) 85% 93% 93%   Weight:      Height:        Intake/Output Summary (Last 24 hours) at 03/02/2017 1615 Last data filed at 03/02/2017 1400 Gross per 24 hour  Intake 823.35 ml  Output 1700 ml  Net -876.65 ml   Filed Weights   02/28/17 0500 03/01/17 0439 03/02/17 0500  Weight: (!) 363 lb 12.1 oz (165 kg) (!) 371 lb 14.7 oz (168.7 kg) (!) 370 lb 2.4 oz (167.9 kg)   Body mass index is 51.63 kg/m.   General: Well developed, well nourished, NAD Skin: Warm, dry, intact  Head: Normocephalic, atraumatic, clear, moist mucus membranes. Neck: Negative for carotid bruits. No JVD Lungs:Diminished bilaterally. No wheezes, rales, or rhonchi. Breathing is unlabored. Trach in place.  Cardiovascular: Irregular with S1 S2. No murmurs, rubs, or gallops Abdomen: Soft, non-tender, non-distended with normoactive bowel sounds. No obvious abdominal masses. MSK: Strength and tone appear normal for age. 5/5 in all extremities Extremities: L>R 3+ LE edema. No clubbing or cyanosis. DP/PT pulses 1+ bilaterally Neuro: Alert and oriented. No focal deficits. No facial asymmetry. MAE spontaneously. Psych: Responds to questions appropriately with normal affect.     EKG:  The EKG was personally reviewed and demonstrates: 02/23/17 Atrial fibrillation HR 72 with RBBB Telemetry:  Telemetry was personally reviewed and demonstrates: 03/02/17 Atrial fibrillation HR 96  Relevant CV Studies:  ECHO: 02/17/2017 Study Conclusions  - Left ventricle: The cavity size was moderately dilated. There was   moderate concentric hypertrophy. Systolic function was severely   reduced. The estimated ejection fraction was in the range of 25%   to 30%. Definity contrast was utilized. Diffuse hypokinesis.  The   study is not technically sufficient to allow evaluation of LV   diastolic function. Acoustic contrast opacification revealed no   evidence ofthrombus. - Mitral valve: There was mild regurgitation. - Left atrium: The atrium was mildly dilated.  CATH: NA  Laboratory Data:  Chemistry Recent Labs  Lab 02/28/17 0529 03/01/17 0456 03/02/17 0443  NA 145 140 146*  K 3.2* 3.6 3.4*  CL 102 98* 104  CO2 '31 30 30  '$ GLUCOSE 199* 166* 134*  BUN 50* 51* 52*  CREATININE 0.77 0.66 0.72  CALCIUM 8.1* 7.9* 8.4*  GFRNONAA >60 >60 >60  GFRAA >60 >60 >60  ANIONGAP '12 12 12    '$ Total Protein  Date Value Ref Range Status  02/17/2017 5.4 (L) 6.5 - 8.1 g/dL Final  10/24/2016 6.9 6.4 - 8.3 g/dL Final   Albumin  Date Value Ref Range Status  02/17/2017 2.6 (L) 3.5 - 5.0 g/dL Final  10/24/2016 3.4 (L) 3.5 - 5.0 g/dL Final   AST  Date Value Ref Range Status  02/17/2017 100 (H) 15 - 41 U/L Final  10/24/2016 31 5 - 34 U/L Final   ALT  Date Value Ref Range Status  02/17/2017 57 17 - 63 U/L Final  10/24/2016 31 0 - 55 U/L Final   Alkaline Phosphatase  Date Value Ref Range Status  02/17/2017 158 (H) 38 - 126 U/L Final  10/24/2016 131 40 - 150 U/L Final   Total Bilirubin  Date Value Ref Range Status  02/17/2017 0.9 0.3 - 1.2 mg/dL Final  10/24/2016 0.61 0.20 - 1.20 mg/dL Final   Hematology Recent Labs  Lab 02/28/17 0529 03/01/17 0456 03/02/17 0443  WBC 10.6* 10.3 10.4  RBC 3.93* 3.99* 4.36  HGB 11.7* 12.0* 12.8*  HCT 37.3* 38.1* 40.9  MCV 94.9 95.5 93.8  MCH 29.8 30.1 29.4  MCHC 31.4 31.5 31.3  RDW 16.0* 15.2 16.0*  PLT 213 185 193   Cardiac EnzymesNo results for input(s): TROPONINI in the last 168 hours. No results for input(s): TROPIPOC in the last 168 hours.  BNPNo results for input(s): BNP, PROBNP in the last 168 hours.  DDimer No results for input(s): DDIMER in the last 168 hours. TSH: No results found for: TSH Lipids: Lab Results  Component Value Date   TRIG 122  02/26/2017   HgbA1c: Lab Results  Component Value Date   HGBA1C 6.3 (H) 02/16/2017    Radiology/Studies:  Dg Chest Port 1 View  Result Date: 03/01/2017 CLINICAL DATA:  Respiratory failure EXAM: PORTABLE CHEST 1 VIEW COMPARISON:  02/28/2017 FINDINGS: Cardiac shadow is enlarged but stable. Tracheostomy tube and feeding catheter are again seen. Left-sided chest wall port and right-sided PICC line are noted as well. Patchy infiltrates are again identified bilaterally and stable. No new focal abnormality is seen. IMPRESSION: Stable patchy infiltrates bilaterally. Tubes and lines as described. Electronically Signed   By: Inez Catalina M.D.   On: 03/01/2017 07:16   Dg Chest Port 1 View  Result Date: 02/28/2017 CLINICAL DATA:  Acute respiratory failure EXAM: PORTABLE CHEST 1 VIEW COMPARISON:  02/27/2017 FINDINGS: Support devices are stable. Patchy severe bilateral airspace disease again noted, unchanged. Low lung volumes. Mild cardiomegaly. IMPRESSION: Low lung volumes with bilateral airspace opacities. No significant change. Electronically Signed   By: Rolm Baptise M.D.   On: 02/28/2017 07:51   Dg Chest Port 1 View  Result Date: 02/27/2017 CLINICAL DATA:  Acute respiratory failure EXAM: PORTABLE CHEST 1 VIEW COMPARISON:  Yesterday FINDINGS: Cardiopericardial enlargement.  Diffuse airspace opacity with stable pattern from yesterday. No visible effusion or pneumothorax. There is a porta catheter from the left with tip at the upper cavoatrial junction. Feeding tube at least reaches the diaphragm. Tracheostomy tube in place. IMPRESSION: History of viral pneumonitis and pulmonary edema. Diffuse airspace opacity is unchanged from yesterday. Electronically Signed   By: Monte Fantasia M.D.   On: 02/27/2017 06:52    Assessment and Plan:   1. New onset acute systolic heart failure: -Echocardiogram 02/17/17 with an EF of 25% with diffuse hypokinesis -Milrinone gtt weaned off today 03/02/17 -Will increase Coreg  to '25mg'$  BID, add Cozaar '25mg'$  PT QD, and add Lasix '40mg'$  PT QD for now and monitor his response  -Continue ASA '81mg'$   -Stable Cre, 0.72 today -Weight, 370lb today, 350lb on admission  -I&O, net negative 1.9L since admission; negative 857m today  -Strict I&O, daily weights  2. New onset atrial fibrillation: -EKG, atrial fibrillation with HR in the 90's  -Rate control on coreg; will increase dose to '25mg'$   -Stop hep gtt and add Eliquis '5mg'$  BID  -ASA '81mg'$   -CHA2DS2VASc= at least 4  3. Acute respiratory failure with hypoxemia in the setting of viral Influenza A: -Tracheostomy placed 02/23/17; currently weaning  -Off sedation, pressors -Awaiting LTAC placement; [facility]    4. Bilateral foot wounds: -WOC consulted; culture as necessary  -On abx  5. DMII: -CBG's>>SSI -Per primary   6. HLD: -Continue Lipitor  7. HTN: -Elevated, 153/104>153/107>174/79 -Norvasc '5mg'$  PO QD, increase to Coreg '25mg'$  PO BID -ARB  For questions or updates, please contact CForest AcresPlease consult [email or web address] for contact info under Cardiology/STEMI.   SLyndel SafeNP-C [facility] Pager: [phone]/21/2019 4:15 PM   History and all data above reviewed.  Patient examined.  I agree with the findings as above.  Complex patient with extended hospital stay and plans to move to LTAC.  He has no past cardiac history but is here with Flu and resultant respiratory failure.  we are asked to see him because he has a newly diagnosed cardiomyopathy.  He has no past cardiac history.  He is trached but is awake and gives a clear history.  He has morbid obesity and probably undiagnosed and treated sleep apnea.  However, he has never had any chest pain or palpitations.  He now has a reduced EF as above.  No objective evidence of ischemia.  He does have atrial fib with RVR.  He has LBBB.  He was in atrial fib when he presented.  He does not feel the palpitations.  He denies any pain.   The patient exam reveals  CVHQ:IONGEXBMWno murmurs  ,  Lungs: Clear  ,  Abd: Obese, Ext Diffuse edema  .  All available labs, radiology testing, previous records reviewed. Agree with documented assessment and plan. Cardiomyopathy:  Duration is not clear.  Etiology is not clear although there is no evidence of ischemia.  Could be related to sepsis syndrome or atrial fib.  We will increase his beta blocker and begin ARB low dose.  I do think that he needs daily Lasix given his anasarca now.  We will follow hemodynamics and labs closely during his LTAC stay.  Atrial fib:  Increase beta blocker for rate control and start Eliquis stopping heparin.    JJeneen RinksHochrein  5:04 PM  03/02/2017

## 2017-03-02 NOTE — Progress Notes (Signed)
PULMONARY / CRITICAL CARE MEDICINE   Name: [name] MRN: [id number] DOB: 01-May-1955    ADMISSION DATE:  02/16/2017 CONSULTATION DATE:  02/16/2017  REFERRING MD:  Regenia Skeeter  CHIEF COMPLAINT:  DYspnea  HISTORY OF PRESENT ILLNESS:   62y/o male with a history of lymphoma in remission, last treated in 2012 who was admitted with ARDS due to influenza , found to have new severe systolic congestive heart failure with an EF of 25%   SUBJECTIVE:  Awake & interactive , able to communicate Weaning on pressure support 12/8   VITAL SIGNS: BP (!) 153/107   Pulse 95   Temp 97.7 F (36.5 C) (Oral)   Resp 15   Ht _0  (1.803 m)   Wt (!) 370 lb 2.4 oz (167.9 kg)   SpO2 93%   BMI 51.63 kg/m   HEMODYNAMICS:    VENTILATOR SETTINGS: Vent Mode: PSV;CPAP FiO2 (%):  [50 %] 50 % Set Rate:  [14 bmp] 14 bmp Vt Set:  [600 mL] 600 mL PEEP:  [8 cmH20] 8 cmH20 Pressure Support:  [12 cmH20] 12 cmH20 Plateau Pressure:  [18 cmH20-26 cmH20] 21 cmH20 Filed Weights   02/28/17 0500 03/01/17 0439 03/02/17 0500  Weight: (!) 363 lb 12.1 oz (165 kg) (!) 371 lb 14.7 oz (168.7 kg) (!) 370 lb 2.4 oz (167.9 kg)   INTAKE / OUTPUT:  Intake/Output Summary (Last 24 hours) at 03/02/2017 1339 Last data filed at 03/02/2017 0800 Gross per 24 hour  Intake 1066.25 ml  Output 1875 ml  Net -808.75 ml    PHYSICAL EXAMINATION: General: Massively obese man, ventilated, awake and alert HEENT: Trach in place, #6 cuffed tracheostomy, Cot Trac Pulmonary: resps even non labored on full support, coarse, diminished bases, few scattered wheezes  Cardiac: Irregularly irregular, no murmur Abdomen: Obese, soft, positive bowel sounds, distended Extremities/musculoskeletal: No deformities, chronic venous stasis changes bilateral lower extremities, 1-2+ BLE edema  Neuro/psych: Calm, opens eyes, follows commands, minimal sedation  LABS:  BMET Recent Labs  Lab 02/28/17 0529 03/01/17 0456 03/02/17 0443  NA 145 140 146*   K 3.2* 3.6 3.4*  CL 102 98* 104  CO2 _1 BUN 50* 51* 52*  CREATININE 0.77 0.66 0.72  GLUCOSE 199* 166* 134*    Electrolytes Recent Labs  Lab 02/25/17 0445 02/26/17 0429 02/27/17 0535 02/28/17 0529 03/01/17 0456 03/02/17 0443  CALCIUM 8.2* 8.0* 8.1* 8.1* 7.9* 8.4*  MG 2.2 2.2 2.4 2.3 2.4 2.3  PHOS 3.6 3.5 3.1  --   --   --     CBC Recent Labs  Lab 02/28/17 0529 03/01/17 0456 03/02/17 0443  WBC 10.6* 10.3 10.4  HGB 11.7* 12.0* 12.8*  HCT 37.3* 38.1* 40.9  PLT 213 185 193    Coag's No results for input(s): APTT, INR in the last 168 hours.  Sepsis Markers Recent Labs  Lab 02/24/17 0507  PROCALCITON 0.51    ABG Recent Labs  Lab 02/28/17 1442 02/28/17 1628 03/01/17 1109  PHART 7.520* 7.513* 7.497*  PCO2ART 47.1 43.8 39.5  PO2ART 68.0* 53.0* 49.0*    Liver Enzymes No results for input(s): AST, ALT, ALKPHOS, BILITOT, ALBUMIN in the last 168 hours.  Cardiac Enzymes No results for input(s): TROPONINI, PROBNP in the last 168 hours.  Glucose Recent Labs  Lab 03/01/17 1545 03/01/17 2002 03/01/17 2348 03/02/17 0356 03/02/17 0753 03/02/17 1159  GLUCAP 154* 147* 132* 112* 128* 110*    Imaging No results found.  STUDIES:  2/8 echo > EF  25-30% moderately dilated left ventricle, diffuse hypokinesis mild mitral valve regurgitation, left atrium mildly dilated  CULTURES: 2/7 BCx 2 >> negative 2/7 RVP >> influenza A 2/7 Sputum cx >> normal flora 02/18/2017 BAL>> negative  ANTIBIOTICS: Levaquin 2/3 >2/5; 2/7 x 1 Vancomycin 2/7 >> 2/12 Azactam 2/7 >> 2/12 Tamiflu 2/8 > completed   SIGNIFICANT EVENTS: 2/7 Admit 02/18/2017 intubated for acute respiratory failure  LINES/TUBES: Left chest port >> accessed 2/7 02/18/2017 orotracheal tube>>2/14 02/18/2017 orogastric tube>>2/14 Trach 2/14>>>  DISCUSSION: Obese 62 year old man admitted with respiratory failure due to influenza and possible superimposed bacterial pneumonia.  Also component of  cardiogenic pulmonary edema given new discovery of EF 25%, atrial fibrillation.   Finally making some progress with weaning  ASSESSMENT / PLAN:  ARDS due to Influenza A c/b Acute pulmonary edema.    s/p trach.   Plan Okay to wean on PEEP of 8, goal trach collar Steroid trial started 2/17 - no sig improvement, stopped 2/19 S/p full course abx and tamiflu  D/c aline    Acute systolic HF w/ pulmonary edema (EF 25%) Atrial fib   Plan Off milrinone  Continue heparin gtt, ASA, lipitor  Continue carvedilol  Obtain Cards/ heart failure team input    Hypomagnesemia & hypernatremia  Hypokalemia Plan Continue free water - decrease to 334m q6h Replace electrolytes as indicated Trend BMET daily  Mild thrombocytopenia-->improving History of non-hodgkins lymphoma Plan Follow CBC Monitor for bleeding  Bilateral foot wounds Plan Wound care Foot drop boots Wound care Culture as is clinically indicated  DM2   Plan Follow CBG, sliding scale insulin per protocol  AMS - resolved.  Plan -  Continue melatonin qhs  Will add low dose benzo at night and PRN    FAMILY  Discussed at length with girlfriend at bedside 2/21.   LTAC referral , anticipate we should be able to liberate him from the ventilator, will need extensive rehab  DVT prophylaxis: heparin gtt SUP: ppi Diet: tubefeeds Activity: BR Disposition : ICU    The patient is critically ill with multiple organ systems failure and requires high complexity decision making for assessment and support, frequent evaluation and titration of therapies, application of advanced monitoring technologies and extensive interpretation of multiple databases. Critical Care Time devoted to patient care services described in this note independent of APP/resident  time is 32 minutes.    RKara MeadMD. FShade Flood [facility] Pager [phone] no response call [phone]    03/02/2017  1:39 PM

## 2017-03-02 NOTE — Progress Notes (Signed)
ANTICOAGULATION CONSULT NOTE - Follow Up Consult  Pharmacy Consult for heparin > Eliquis Indication: atrial fibrillation  Allergies  Allergen Reactions  . Penicillins Swelling    Age 62 or 24 - pt states he had swelling all over and his lips got real big    Patient Measurements: Height: 5\' 11"  (180.3 cm) Weight: (!) 370 lb 2.4 oz (167.9 kg) IBW/kg (Calculated) : 75.3 Heparin Dosing Weight: 117 kg  Vital Signs: Temp: 97.9 F (36.6 C) (02/21 1601) Temp Source: Oral (02/21 1601) BP: 153/104 (02/21 1539) Pulse Rate: 100 (02/21 1539)  Labs: Recent Labs    02/28/17 0529 03/01/17 0456 03/02/17 0443  HGB 11.7* 12.0* 12.8*  HCT 37.3* 38.1* 40.9  PLT 213 185 193  HEPARINUNFRC 0.56 0.70 <0.10*  CREATININE 0.77 0.66 0.72    Estimated Creatinine Clearance: 154 mL/min (by C-G formula based on SCr of 0.72 mg/dL).  Assessment: 62 year old male with new atrial fibrillation on heparin. Heparin was stopped yesterday due to bloody sputum and restarted today when bleeding resolved.  Cardiology asked pharmacy to switch patient to Eliquis. He does not meet dose reduction requirements (though literature is scant on patients weighing >120kg).   No bleeding noted today.  Goal of Therapy:  Heparin level 0.3-0.7 units/ml Monitor platelets by anticoagulation protocol: Yes   Plan:  Continue heparin at 2050 units/hr until 2159- then stop First dose of Eliquis 5mg  PO BID due at 2200 tonight Monitor for further s/sx of bleeding   Diamond Jentz D. Royal Beirne, PharmD, [place] Clinical Pharmacist [phone] 03/02/2017 5:08 PM

## 2017-03-03 DIAGNOSIS — I481 Persistent atrial fibrillation: Secondary | ICD-10-CM

## 2017-03-03 LAB — GLUCOSE, CAPILLARY
GLUCOSE-CAPILLARY: 114 mg/dL — AB (ref 65–99)
GLUCOSE-CAPILLARY: 132 mg/dL — AB (ref 65–99)
GLUCOSE-CAPILLARY: 129 mg/dL — AB (ref 65–99)
GLUCOSE-CAPILLARY: 118 mg/dL — AB (ref 65–99)
Glucose-Capillary: 123 mg/dL — ABNORMAL HIGH (ref 65–99)

## 2017-03-03 LAB — BASIC METABOLIC PANEL
Anion gap: 11 (ref 5–15)
BUN: 41 mg/dL — AB (ref 6–20)
CALCIUM: 8.5 mg/dL — AB (ref 8.9–10.3)
CO2: 31 mmol/L (ref 22–32)
CREATININE: 0.64 mg/dL (ref 0.61–1.24)
Chloride: 104 mmol/L (ref 101–111)
GFR calc Af Amer: >60 mL/min (ref >60)
GLUCOSE: 115 mg/dL — AB (ref 65–99)
Potassium: 3.2 mmol/L — ABNORMAL LOW (ref 3.5–5.1)
Sodium: 146 mmol/L — ABNORMAL HIGH (ref 135–145)

## 2017-03-03 LAB — MAGNESIUM: MAGNESIUM: 1.9 mg/dL (ref 1.7–2.4)

## 2017-03-03 LAB — CBC
HCT: 42.4 % (ref 39.0–52.0)
HEMOGLOBIN: 13.6 g/dL (ref 13.0–17.0)
MCH: 30 pg (ref 26.0–34.0)
MCHC: 32.1 g/dL (ref 30.0–36.0)
MCV: 93.4 fL (ref 78.0–100.0)
PLATELETS: 194 10*3/uL (ref 150–400)
RBC: 4.54 MIL/uL (ref 4.22–5.81)
RDW: 16 % — AB (ref 11.5–15.5)
WBC: 11.2 10*3/uL — ABNORMAL HIGH (ref 4.0–10.5)

## 2017-03-03 MED ORDER — HEPARIN SOD (PORK) LOCK FLUSH 100 UNIT/ML IV SOLN
500.0000 [IU] | INTRAVENOUS | Status: AC | PRN
  Administered 2017-03-03: 500 [IU]

## 2017-03-03 MED ORDER — LOSARTAN POTASSIUM 50 MG PO TABS
50.0000 mg | ORAL_TABLET | Freq: Every day | ORAL | Status: DC
  Administered 2017-03-04 – 2017-03-21 (×18): 50 mg
  Filled 2017-03-03 (×18): qty 1

## 2017-03-03 MED ORDER — APIXABAN 5 MG PO TABS
5.0000 mg | ORAL_TABLET | Freq: Two times a day (BID) | ORAL | Status: DC
  Administered 2017-03-03 – 2017-03-21 (×36): 5 mg
  Filled 2017-03-03 (×37): qty 1

## 2017-03-03 MED ORDER — POTASSIUM CHLORIDE 20 MEQ/15ML (10%) PO SOLN
30.0000 meq | ORAL | Status: AC
  Administered 2017-03-03 (×2): 30 meq
  Filled 2017-03-03 (×2): qty 30

## 2017-03-03 MED ORDER — DEXTROMETHORPHAN POLISTIREX ER 30 MG/5ML PO SUER
15.0000 mg | Freq: Two times a day (BID) | ORAL | Status: AC
  Administered 2017-03-03 – 2017-03-04 (×4): 15 mg
  Filled 2017-03-03 (×4): qty 5

## 2017-03-03 NOTE — Progress Notes (Addendum)
CSW received call from [facility] and was informed that they are unable to take pt at this time due to FiO2 needing to be higher. CSW to remain in contact with [facility] for any further updates that may lead to her taking pt if pt is needing vent/snf at the time of discharge.     Virgie Dad Addysin Porco, MSW, [facility] Clinical Social Worker [phone]

## 2017-03-03 NOTE — Progress Notes (Signed)
[facility] ADULT ICU REPLACEMENT PROTOCOL FOR AM LAB REPLACEMENT ONLY  The patient does apply for the [facility] Adult ICU Electrolyte Replacment Protocol based on the criteria listed below:   1. Is GFR >/= 40 ml/min? Yes.    Patient's GFR today is >60 2. Is urine output >/= 0.5 ml/kg/hr for the last 6 hours? Yes.   Patient's UOP is .68 ml/kg/hr 3. Is BUN < 60 mg/dL? Yes.    Patient's BUN today is 41 4. Abnormal electrolyte(s): K was 3.2 5. Ordered repletion with: per protocol 6. If a panic level lab has been reported, has the CCM MD in charge been notified? Yes.  .   Physician:  Dr. Haskell Riling, Philis Nettle 03/03/2017 6:27 AM

## 2017-03-03 NOTE — Progress Notes (Signed)
Pt placed back on vent wean more to allow pt to rest per MD request.

## 2017-03-03 NOTE — Progress Notes (Addendum)
Progress Note  Patient Name: [name] Date of Encounter: 03/03/2017  Primary Cardiologist: No primary care provider on file.   Subjective   Pt still on vent, was off for a time earlier today. Working on Careers adviser. Denies chest pain. Has DOE. Stood up with PT for first time today. Complains of intermittent headache. Is responsive to tylenol.   Inpatient Medications    Scheduled Meds: . amLODipine  5 mg Per Tube Daily  . apixaban  5 mg Per Tube BID  . aspirin  81 mg Per Tube Daily  . atorvastatin  20 mg Per Tube q1800  . carvedilol  25 mg Per Tube BID WC  . chlorhexidine gluconate (MEDLINE KIT)  15 mL Mouth Rinse BID  . Chlorhexidine Gluconate Cloth  6 each Topical Daily  . clonazePAM  0.5 mg Per Tube QHS  . dextromethorphan  15 mg Per Tube BID  . feeding supplement (PRO-STAT SUGAR FREE 64)  60 mL Per Tube TID  . free water  300 mL Per Tube Q6H  . furosemide  40 mg Per Tube BID  . insulin aspart  0-15 Units Subcutaneous Q4H  . ipratropium-albuterol  3 mL Nebulization TID  . losartan  25 mg Per Tube Daily  . mouth rinse  15 mL Mouth Rinse QID  . Melatonin  6 mg Oral QHS  . mupirocin cream   Topical Daily  . pantoprazole sodium  40 mg Per Tube Q1200  . sodium chloride flush  10-40 mL Intracatheter Q12H   Continuous Infusions: . sodium chloride    . feeding supplement (VITAL HIGH PROTEIN) 1,000 mL (03/03/17 0525)   PRN Meds: Place/Maintain arterial line **AND** sodium chloride, acetaminophen, fentaNYL (SUBLIMAZE) injection, hydrALAZINE, ipratropium-albuterol, morphine injection, ondansetron (ZOFRAN) IV, sodium chloride flush, sodium chloride flush   Vital Signs    Vitals:   03/03/17 1108 03/03/17 1138 03/03/17 1400 03/03/17 1408  BP: 131/89  (!) 143/95 (!) 143/95  Pulse: 99  96 86  Resp: (!) 22  (!) 26 (!) 22  Temp:  98.8 F (37.1 C)    TempSrc:  Oral    SpO2:   92% 92%  Weight:      Height:        Intake/Output Summary (Last 24 hours) at 03/03/2017 1525 Last  data filed at 03/03/2017 1400 Gross per 24 hour  Intake 3159.13 ml  Output 5750 ml  Net -2590.87 ml   Filed Weights   03/01/17 0439 03/02/17 0500 03/03/17 0500  Weight: (!) 371 lb 14.7 oz (168.7 kg) (!) 370 lb 2.4 oz (167.9 kg) (!) 345 lb 10.9 oz (156.8 kg)    Telemetry    Afib in the 90's-110's - Personally Reviewed  ECG    No new tracings - Personally Reviewed  Physical Exam   GEN: Morbidly obese male. No acute distress.   Neck: No JVD, large neck makes it difficult to assess.  Cardiac: Irregularly irregular rhythm, no murmurs, rubs, or gallops.  Respiratory: Diminished bilaterally. Tracheostomy present, currently on vent.  GI: Soft, nontender, non-distended  MS: Generalized edema, tight edema up to mid calf Neuro:  Nonfocal  Psych: Normal affect   Labs    Chemistry Recent Labs  Lab 03/01/17 0456 03/02/17 0443 03/03/17 0508  NA 140 146* 146*  K 3.6 3.4* 3.2*  CL 98* 104 104  CO2 '30 30 31  '$ GLUCOSE 166* 134* 115*  BUN 51* 52* 41*  CREATININE 0.66 0.72 0.64  CALCIUM 7.9* 8.4* 8.5*  GFRNONAA >  60 >60 >60  GFRAA >60 >60 >60  ANIONGAP '12 12 11     '$ Hematology Recent Labs  Lab 03/01/17 0456 03/02/17 0443 03/03/17 0508  WBC 10.3 10.4 11.2*  RBC 3.99* 4.36 4.54  HGB 12.0* 12.8* 13.6  HCT 38.1* 40.9 42.4  MCV 95.5 93.8 93.4  MCH 30.1 29.4 30.0  MCHC 31.5 31.3 32.1  RDW 15.2 16.0* 16.0*  PLT 185 193 194    Cardiac EnzymesNo results for input(s): TROPONINI in the last 168 hours. No results for input(s): TROPIPOC in the last 168 hours.   BNPNo results for input(s): BNP, PROBNP in the last 168 hours.   DDimer No results for input(s): DDIMER in the last 168 hours.   Radiology    No results found.  Cardiac Studies   ECHO: 02/17/2017 Study Conclusions  - Left ventricle: The cavity size was moderately dilated. There was moderate concentric hypertrophy. Systolic function was severely reduced. The estimated ejection fraction was in the range of  25% to 30%. Definity contrast was utilized. Diffuse hypokinesis. The study is not technically sufficient to allow evaluation of LV diastolic function. Acoustic contrast opacification revealed no evidence ofthrombus. - Mitral valve: There was mild regurgitation. - Left atrium: The atrium was mildly dilated.  Patient Profile     62 y.o. male with a hx of Non-Hodgkin's lymphoma stage III (last treated with chemo and radiation in 2012, now in remission), HTN, HLD, scoliosis, PVD, chronic bilateral foot wounds and gout who was admitted by PCCM service with ARDS secondary to influenza A who is being seen for the evaluation of new onset acute systolic heart failure with an EF of 25-30% and also new onset atrial fibrillation.   Assessment & Plan    New onset acute systolic heart failure -Etiology unclear. No past cardiac history. Pt with morbid obesity and likely undiagnosed sleep apnea. No objective evidence of ischemia. Has LBBB.  -Echocardiogram 02/17/17 with EF 25% and diffuse hypokinesis.  -Was on Milrinone, weaned off yesterday, 2/21. -Medications were optimized yesterday with increase in Coreg to 25 mg BID, ARB added- losartan 25 mg daily, lasix 40 mg daily. He is continued on aspirin 81 mg daily.  -BP stable on new meds. Still has anasarca.  -Wts in chart are very variable, unable to rely on.  -Pt had 3.7L UOP yesterday and 2.8L already today. Net negative 5.7L fluid balance. -SCr stable, 0.64 today -Continue to monitor on currently medical therapy.  -Will need follow up echo in 3-4 months after recovery from this resp illness.   New onset atrial fibrillation -Rate control on carvedilol increased to 25 mg yesterday. Current rates in the 90's to 100's.  -CHA2DS2/VAS Stroke Risk Score is at least 3 (CHF, HTN, vascular disease). Currently on apixaban 5 mg bid for stroke risk reduction.     Hypertension -On amlodipine 5 mg daily, carvedilol 25 mg bid, lasix 40 mg daily, losartan 25  mg daily. Recently adjusted yesterday. BP running 140's/80's-90's. Continue to monitor on current therapy. May need uptitration of ARB if this doesn't achieve optimal response.   Acute respiratory failure with hyopxemia in the setting of viral influenza A. -Intubated for resp distress 02/18/17, Tracheostomy placed 02/23/17, currently weaning from vent. -Awaiting LTAC placement  Hypokalemia -PCCM managing with supplementation. K+ 3.2 today.  Bilateral foot wounds -WOC consulted. On antibiotics  Hyperlipidemia -Continue Lipitor   For questions or updates, please contact [facility] Please consult [email or web address] for contact info under Cardiology/STEMI.      Signed,  Daune Perch, NP  03/03/2017, 3:25 PM    History and all data above reviewed.  Patient examined.  I agree with the findings as above. Good UO.   The patient exam reveals ANV:BTYOMAYOK  ,  Lungs:   Clear  ,  Abd: .obese, Ext Severe Edema  .  All available labs, radiology testing, previous records reviewed. Agree with documented assessment and plan.  Anasarca:  Good UO.  Continue current VT Lasix.  Increase as needed if UO drops off.  HTN:  Increase Cozaar.  Atrial fib:  On Coreg and Elqiuis.  Consider adding digoxin if rate remains high.    Jeneen Rinks Jubal Rademaker  6:42 PM  03/03/2017

## 2017-03-03 NOTE — Progress Notes (Signed)
Pt placed on ATC per MD request, 10L, 98% FIO2, sat 88-89%. Pt states he feels comfortable and has no increased WOB.  MD and RN notified of FIO2 and sat.

## 2017-03-03 NOTE — Progress Notes (Addendum)
0700 Bedside shift report. Pt alert, resting on vent, friend at bedside. Pt able to express needs, asked if RN could swab mouth, RN performed. Pt has cough, vent alarming frequently, pt c/o tickle in throat. Oral care and suctioning performed. Fall precautions in place, [facility].   0730 Pt assessed, see flow sheet. Updated with POC for day. WCTM.   28 Dr. Elsworth Soho at bedside assessing pt. No new orders received.   1115 Pt switched to trach collar, 98%, tolerating well. O2 sats in low 90s. Pt coughing up tan, blood tinged secretions.   1205 Pt bathed, full linen change, and CHG bath given. Pt had BM. C/O MASD to groin, buttocks, barrier cream applied and pt turned and repositioned.   1300 IV team here to de-access port a cath. RN changed PICC line to right upper arm. Pt tolerated well.   78 Pt moved to therapy bed, RT placed back on vent for movement, pt tolerated well. Pt c/o being "hot", multiple washcloths to face, neck, chest to try to keep cool. PT & OT in room working with pt.

## 2017-03-03 NOTE — Progress Notes (Addendum)
Physical Therapy Treatment Patient Details Name: [name] MRN: [id number] DOB: 1955/08/23 Today's Date: 03/03/2017    History of Present Illness Pt is a 62 y/o male history of lymphoma in remission admitted with acute on chronic hypoxemic and hypercapnic respiratory failure due to influenza A.  He was also noted to have a new LVEF of 25% with contribution of cardiogenic edema.  He failed extubation on 2/14.  Based on his need for continued ventilatory support he underwent percutaneous tracheostomy on 2/14.    PT Comments    Pt admitted with above diagnosis. Pt currently with functional limitations due to balance and endurance deficits. Pt was able to tolerate transfer to tilt bed and then progressively inclination to 30 degree for 4 minutes and 50 degree standing position for 3 minutes.   Pt BP slightly elevated and desat possible to 85% (waveform not stable) but overall tolerance good.  Nurse aware to tilt pt 3 x day and tilt schedule placed on wall in room.  Will progress as pt tolerates. Pt will benefit from skilled PT to increase their independence and safety with mobility to allow discharge to the venue listed below.     Follow Up Recommendations  LTACH;Other (comment)(vs SNF based on vent needs)     Equipment Recommendations  None recommended by PT    Recommendations for Other Services       Precautions / Restrictions Precautions Precautions: Fall Precaution Comments: trach, vent, NG Restrictions Weight Bearing Restrictions: No    Mobility  Bed Mobility               General bed mobility comments: NT due to Vital Go bed.  Total assist to scoot up in bed.  Transfers Overall transfer level: Needs assistance Equipment used: (Vital Go tilt bed) Transfers: Sit to/from Stand Sit to Stand: +2 physical assistance;Total assist         General transfer comment: Pt progressively inclined in Vital Go tilt bed to 30 degree position for 4 minutes and then to 50 degree  position for 3 minutes.  Pt performed quad sets/semi squats x 5 while in this position and then stated fatigue and he wanted to lie back down.  BP initially 135/91 and 156/113 in 30 degree, 168/123 at 50 degree, and 139/98 once settled in bed.  O2 sats registering at 85% with pt coughing some in standing position however waveform not always stable.  Overall sat 90% and above.  Used suction yaunker  several times during treatment.    Ambulation/Gait                 Stairs            Wheelchair Mobility    Modified Rankin (Stroke Patients Only)       Balance                                            Cognition Arousal/Alertness: Awake/alert Behavior During Therapy: WFL for tasks assessed/performed Overall Cognitive Status: Within Functional Limits for tasks assessed                                 General Comments: trach but pt trying to mouth words and followed all commands and remained alert throughout session. Was visibly very excited to stand with tilt bed  Exercises General Exercises - Lower Extremity Mini-Sqauts: AROM;Both;10 reps;Seated Other Exercises Other Exercises: Instructed pt/significant other that pt can perform UE exercises with weighter dowel rod (1 or 2 lbs) throughout day and explained how to clean it after.      General Comments General comments (skin integrity, edema, etc.): Vent 50% FiO2 and PEEP  8.        Pertinent Vitals/Pain Pain Assessment: No/denies pain Faces Pain Scale: No hurt  See above  Home Living Family/patient expects to be discharged to:: Other (Comment)(LTACH v. SNF depending on vent wean) Living Arrangements: Spouse/significant other                  Prior Function Level of Independence: Independent          PT Goals (current goals can now be found in the care plan section) Progress towards PT goals: Progressing toward goals    Frequency    Min 2X/week      PT Plan  Current plan remains appropriate    Co-evaluation PT/OT/SLP Co-Evaluation/Treatment: Yes Reason for Co-Treatment: Complexity of the patient's impairments (multi-system involvement);For patient/therapist safety PT goals addressed during session: Mobility/safety with mobility;Proper use of DME;Balance        AM-PAC PT "6 Clicks" Daily Activity  Outcome Measure  Difficulty turning over in bed (including adjusting bedclothes, sheets and blankets)?: Unable Difficulty moving from lying on back to sitting on the side of the bed? : Unable Difficulty sitting down on and standing up from a chair with arms (e.g., wheelchair, bedside commode, etc,.)?: Unable Help needed moving to and from a bed to chair (including a wheelchair)?: Total Help needed walking in hospital room?: Total Help needed climbing 3-5 steps with a railing? : Total 6 Click Score: 6    End of Session Equipment Utilized During Treatment: (trach on vent) Activity Tolerance: Patient tolerated treatment well;Patient limited by fatigue Patient left: in bed;with call bell/phone within reach;with family/visitor present Nurse Communication: Mobility status;Need for lift equipment PT Visit Diagnosis: Other abnormalities of gait and mobility (R26.89)     Time: 0932-6712 PT Time Calculation (min) (ACUTE ONLY): 39 min  Charges:  $Therapeutic Exercise: 8-22 mins $Therapeutic Activity: 8-22 mins                    G Codes:       Dois Juarbe,PT Acute Rehabilitation [phone] (pager)    Denice Paradise 03/03/2017, 3:18 PM

## 2017-03-03 NOTE — Progress Notes (Signed)
PULMONARY / CRITICAL CARE MEDICINE   Name: [name] MRN: [id number] DOB: [birth year]    ADMISSION DATE:  02/16/2017 CONSULTATION DATE:  02/16/2017  REFERRING MD:  Regenia Skeeter  CHIEF COMPLAINT:  DYspnea  HISTORY OF PRESENT ILLNESS:   62y/o male with a history of lymphoma in remission, last treated in 2012 who was admitted with ARDS due to influenza , found to have new severe systolic congestive heart failure with an EF of 25%   SUBJECTIVE: Remains critically ill, on vent Weaning on pressure support 10/8 Appears tired, did not sleep all night due to coughing   VITAL SIGNS: BP 131/89   Pulse 99   Temp 98.8 F (37.1 C) (Oral)   Resp (!) 22   Ht _0  (1.803 m)   Wt (!) 345 lb 10.9 oz (156.8 kg)   SpO2 90%   BMI 48.21 kg/m   HEMODYNAMICS:    VENTILATOR SETTINGS: Vent Mode: PSV;CPAP FiO2 (%):  [50 %-98 %] 98 % Set Rate:  [14 bmp] 14 bmp Vt Set:  [600 mL] 600 mL PEEP:  [8 cmH20] 8 cmH20 Pressure Support:  [10 cmH20-12 cmH20] 10 cmH20 Plateau Pressure:  [17 cmH20] 17 cmH20 Filed Weights   03/01/17 0439 03/02/17 0500 03/03/17 0500  Weight: (!) 371 lb 14.7 oz (168.7 kg) (!) 370 lb 2.4 oz (167.9 kg) (!) 345 lb 10.9 oz (156.8 kg)   INTAKE / OUTPUT:  Intake/Output Summary (Last 24 hours) at 03/03/2017 1345 Last data filed at 03/03/2017 1200 Gross per 24 hour  Intake 3059.63 ml  Output 4850 ml  Net -1790.37 ml    PHYSICAL EXAMINATION: General: Massively obese man, ventilated, awake and alert HEENT: Trach in place, #6 cuffed tracheostomy, Cot Trac Pulmonary: resps even non labored on full support, coarse, diminished bases, few scattered wheezes  Cardiac: Irregularly irregular, no murmur Abdomen: Obese, soft, positive bowel sounds, distended Extremities/musculoskeletal: No deformities, chronic venous stasis changes bilateral lower extremities, 1-2+ BLE edema  Neuro/psych: Calm,interactive, no sedation  LABS:  BMET Recent Labs  Lab 03/01/17 0456 03/02/17 0443  03/03/17 0508  NA 140 146* 146*  K 3.6 3.4* 3.2*  CL 98* 104 104  CO2 _1 BUN 51* 52* 41*  CREATININE 0.66 0.72 0.64  GLUCOSE 166* 134* 115*    Electrolytes Recent Labs  Lab 02/25/17 0445 02/26/17 0429 02/27/17 0535  03/01/17 0456 03/02/17 0443 03/03/17 0508  CALCIUM 8.2* 8.0* 8.1*   < > 7.9* 8.4* 8.5*  MG 2.2 2.2 2.4   < > 2.4 2.3 1.9  PHOS 3.6 3.5 3.1  --   --   --   --    < > = values in this interval not displayed.    CBC Recent Labs  Lab 03/01/17 0456 03/02/17 0443 03/03/17 0508  WBC 10.3 10.4 11.2*  HGB 12.0* 12.8* 13.6  HCT 38.1* 40.9 42.4  PLT 185 193 194    Coag's No results for input(s): APTT, INR in the last 168 hours.  Sepsis Markers No results for input(s): LATICACIDVEN, PROCALCITON, O2SATVEN in the last 168 hours.  ABG Recent Labs  Lab 02/28/17 1442 02/28/17 1628 03/01/17 1109  PHART 7.520* 7.513* 7.497*  PCO2ART 47.1 43.8 39.5  PO2ART 68.0* 53.0* 49.0*    Liver Enzymes No results for input(s): AST, ALT, ALKPHOS, BILITOT, ALBUMIN in the last 168 hours.  Cardiac Enzymes No results for input(s): TROPONINI, PROBNP in the last 168 hours.  Glucose Recent Labs  Lab 03/02/17 1159 03/02/17 1555 03/02/17 1938  03/02/17 2348 03/03/17 0747 03/03/17 1139  GLUCAP 110* 106* 141* 143* 114* 132*    Imaging No results found.  STUDIES:  2/8 echo > EF 25-30% moderately dilated left ventricle, diffuse hypokinesis mild mitral valve regurgitation, left atrium mildly dilated  CULTURES: 2/7 BCx 2 >> negative 2/7 RVP >> influenza A 2/7 Sputum cx >> normal flora 02/18/2017 BAL>> negative  ANTIBIOTICS: Levaquin 2/3 >2/5; 2/7 x 1 Vancomycin 2/7 >> 2/12 Azactam 2/7 >> 2/12 Tamiflu 2/8 > completed   SIGNIFICANT EVENTS: 2/7 Admit 02/18/2017 intubated for acute respiratory failure  LINES/TUBES: Left chest port >> accessed 2/7 02/18/2017 orotracheal tube>>2/14 02/18/2017 orogastric tube>>2/14 Trach 2/14>>>  DISCUSSION: Making good  progress with weaning  ASSESSMENT / PLAN:  ARDS due to Influenza A c/b Acute pulmonary edema.    s/p trach.   Plan Okay to wean on PEEP of 8, goal trach collar, start with daytime S/p full course abx and tamiflu   Acute systolic HF w/ pulmonary edema (EF 25%) Atrial fib   Plan Off milrinone  Continue  ASA, lipitor , Eliquis started by radiology Continue carvedilol , ARB added   Hypomagnesemia & hypernatremia  Hypokalemia Plan Continue free water -355m q6h Replace K as indicated Trend BMET daily  Mild thrombocytopenia-->improved History of non-hodgkins lymphoma Plan Follow CBC Monitor for bleeding  Bilateral foot wounds Plan Wound care Foot drop boots   DM2   Plan Follow CBG, sliding scale insulin per protocol     FAMILY  Updated patient and girlfriend at bedside 2/22.   LTAC referral , anticipate we should be able to liberate him from the ventilator, will need extensive rehab  DVT prophylaxis: eliquis SUP: ppi Diet: tubefeeds Activity: BR Disposition : ICU    The patient is critically ill with multiple organ systems failure and requires high complexity decision making for assessment and support, frequent evaluation and titration of therapies, application of advanced monitoring technologies and extensive interpretation of multiple databases. Critical Care Time devoted to patient care services described in this note independent of APP/resident  time is 32 minutes.    RKara MeadMD. FShade Flood [facility] Pager [phone] no response call [phone]    03/03/2017  1:45 PM

## 2017-03-03 NOTE — Evaluation (Signed)
Occupational Therapy Evaluation Patient Details Name: [name] MRN: [id number] DOB: May 15, 1955 Today's Date: 03/03/2017    History of Present Illness Pt is a 62 y/o male history of lymphoma in remission admitted with acute on chronic hypoxemic and hypercapnic respiratory failure due to influenza A.  He was also noted to have a new LVEF of 25% with contribution of cardiogenic edema.  He failed extubation on 2/14.  Based on his need for continued ventilatory support he underwent percutaneous tracheostomy on 2/14.   Clinical Impression   This 62 yo male admitted with above presents to acute OT with decreased mobility, decreased strength, obesity, generalized weakness, thus affecting his ability to A with his basic ADLs. He will benefit from acute OT with follow up OT on LTACH to work towards a Min A level or better. See PT note for vital signs.    Follow Up Recommendations  LTACH    Equipment Recommendations  Other (comment)(TBD next venue)       Precautions / Restrictions Precautions Precautions: Fall Precaution Comments: trach, vent, NG Restrictions Weight Bearing Restrictions: No      Mobility Bed Mobility               General bed mobility comments: NT due to Vital Go bed.  Total assist to scoot up in bed.  Transfers Overall transfer level: Needs assistance Equipment used: (Vital Go tilt bed) Transfers: Sit to/from Stand Sit to Stand: +2 physical assistance;Total assist         General transfer comment: Pt progressively inclined in Vital Go tilt bed to 30 degree position for 4 minutes and then to 50 degree position for 3 minutes.  Pt performed quad sets/semi squats x 5 while in this position and then stated fatigue and he wanted to lie back down.  BP initially 135/91 and 156/113 in 30 degree, 168/123 at 50 degree, and 139/98 once settled in bed.  O2 sats registering at 85% with pt coughing some in standing position however waveform not always stable.  Overall sat  90% and above.  Used suction yaunker  several times during treatment.          ADL either performed or assessed with clinical judgement   ADL Overall ADL's : Needs assistance/impaired Eating/Feeding: NPO;Bed level   Grooming: Moderate assistance;Bed level   Upper Body Bathing: Maximal assistance;Bed level   Lower Body Bathing: Total assistance;Bed level   Upper Body Dressing : Total assistance;Bed level   Lower Body Dressing: Total assistance;Bed level                 General ADL Comments: Pt can bring yonkers to mouth, move sleeve up/down yonkers, and turn yonkers on/off utilizing both UEs     Vision Patient Visual Report: No change from baseline              Pertinent Vitals/Pain Pain Assessment: No/denies pain Faces Pain Scale: No hurt     Hand Dominance Right   Extremity/Trunk Assessment Upper Extremity Assessment Upper Extremity Assessment: Generalized weakness RUE Deficits / Details: moving both arms and hands grossly 2/5-3/5 LUE Deficits / Details: moving both arms and hands grossly 2/5-3/5           Communication Communication Communication: Tracheostomy   Cognition Arousal/Alertness: Awake/alert Behavior During Therapy: WFL for tasks assessed/performed Overall Cognitive Status: Within Functional Limits for tasks assessed  General Comments: trach but pt trying to mouth words and followed all commands and remained alert throughout session. Was visibly very excited to stand with tilt bed   General Comments  Vent 50% FiO2 and PEEP  8.      Exercises Exercises: General Lower Extremity;Other exercises General Exercises - Lower Extremity Mini-Sqauts: AROM;Both;10 reps;Seated Other Exercises Other Exercises: Instructed pt/significant other that pt can perform UE exercises with weighter dowel rod (1 or 2 lbs) throughout day and explained how to clean it after.          Home Living Family/patient  expects to be discharged to:: Other (Comment)(LTACH v. SNF depending on vent wean) Living Arrangements: Spouse/significant other                                      Prior Functioning/Environment Level of Independence: Independent                 OT Problem List: Decreased strength;Decreased range of motion;Impaired balance (sitting and/or standing);Obesity;Decreased knowledge of use of DME or AE      OT Treatment/Interventions: Self-care/ADL training;Balance training;Therapeutic activities;Therapeutic exercise;DME and/or AE instruction;Patient/family education    OT Goals(Current goals can be found in the care plan section) Acute Rehab OT Goals Patient Stated Goal: wanting to work on anything that will make him stronger OT Goal Formulation: With patient Time For Goal Achievement: 04/14/17 Potential to Achieve Goals: Good  OT Frequency: Min 2X/week           Co-evaluation PT/OT/SLP Co-Evaluation/Treatment: Yes Reason for Co-Treatment: Complexity of the patient's impairments (multi-system involvement);For patient/therapist safety PT goals addressed during session: Mobility/safety with mobility;Proper use of DME;Balance OT goals addressed during session: Strengthening/ROM      AM-PAC PT "6 Clicks" Daily Activity     Outcome Measure Help from another person eating meals?: Total Help from another person taking care of personal grooming?: A Lot Help from another person toileting, which includes using toliet, bedpan, or urinal?: Total Help from another person bathing (including washing, rinsing, drying)?: Total Help from another person to put on and taking off regular upper body clothing?: Total Help from another person to put on and taking off regular lower body clothing?: Total 6 Click Score: 7   End of Session Nurse Communication: (PT to speak to RN about tilt bed)  Activity Tolerance: Patient tolerated treatment well Patient left: in bed;with call  bell/phone within reach;with family/visitor present  OT Visit Diagnosis: Other abnormalities of gait and mobility (R26.89);Muscle weakness (generalized) (M62.81)                Time: 5361-4431 OT Time Calculation (min): 39 min Charges:  OT General Charges $OT Visit: 1 Visit OT Evaluation $OT Eval Moderate Complexity: [address] [place] [phone] 03/03/2017

## 2017-03-03 NOTE — Progress Notes (Signed)
Nutrition Follow-up  DOCUMENTATION CODES:   Morbid obesity  INTERVENTION:   Continue TF:  Vital High Protein at 50 ml/h (1200 ml per day)  Pro-stat 60 ml TID  Provides 1800 kcal, 195 gm protein, 1003 ml free water daily  NUTRITION DIAGNOSIS:   Inadequate oral intake related to inability to eat as evidenced by NPO status.  Ongoing  GOAL:   Provide needs based on ASPEN/SCCM guidelines  Met with TF   MONITOR:   Vent status, TF tolerance, Labs, I & O's  ASSESSMENT:   62 yo male with PMH of HTN, venous insufficiency, HLD, gout, NHL (in remission, last treated in 2012) who was admitted on 2/7 with acute hypoxemia respiratory failure, influenza A, and pulmonary edema. Required intubation on 2/9.  Discussed patient in ICU rounds and with RN today.  Patient is currently receiving Vital High Protein via Cortrak tube at 50 ml/h (1200 ml/day) with Prostat 60 ml TID to provide 1800 kcals, 195 gm protein, 1003 ml free water daily.  Free water flushes 300 ml every 6 hours Patient is currently on trach collar; on vent last night. Labs reviewed. Sodium 146 (H), potassium 3.2 (L) CBG's: 141-143-114-132 Medications reviewed and include Lasix.  Diet Order:  Diet NPO time specified  EDUCATION NEEDS:   No education needs have been identified at this time  Skin:  Skin Assessment: Skin Integrity Issues: Skin Integrity Issues:: Stage II, DTI DTI: coccyx Stage II: R buttocks Other: venous stasis ulcer to R ankle and L toe  Last BM:  2/22  Height:   Ht Readings from Last 1 Encounters:  02/27/17 '5\' 11"'$  (1.803 m)    Weight:   Wt Readings from Last 1 Encounters:  03/03/17 (!) 345 lb 10.9 oz (156.8 kg)    Ideal Body Weight:  78.18 kg  BMI:  Body mass index is 48.21 kg/m.  Estimated Nutritional Needs:   Kcal:  8832-5498  Protein:  175-195 gm  Fluid:  2 L   Molli Barrows, RD, LDN, [place] Pager [phone] After Hours Pager [phone]

## 2017-03-04 DIAGNOSIS — I420 Dilated cardiomyopathy: Secondary | ICD-10-CM

## 2017-03-04 DIAGNOSIS — J1100 Influenza due to unidentified influenza virus with unspecified type of pneumonia: Secondary | ICD-10-CM

## 2017-03-04 DIAGNOSIS — J9600 Acute respiratory failure, unspecified whether with hypoxia or hypercapnia: Secondary | ICD-10-CM

## 2017-03-04 LAB — CBC
HCT: 41.1 % (ref 39.0–52.0)
HEMOGLOBIN: 13.1 g/dL (ref 13.0–17.0)
MCH: 29.9 pg (ref 26.0–34.0)
MCHC: 31.9 g/dL (ref 30.0–36.0)
MCV: 93.8 fL (ref 78.0–100.0)
PLATELETS: 201 10*3/uL (ref 150–400)
RBC: 4.38 MIL/uL (ref 4.22–5.81)
RDW: 16 % — ABNORMAL HIGH (ref 11.5–15.5)
WBC: 10.9 10*3/uL — AB (ref 4.0–10.5)

## 2017-03-04 LAB — GLUCOSE, CAPILLARY
GLUCOSE-CAPILLARY: 112 mg/dL — AB (ref 65–99)
Glucose-Capillary: 116 mg/dL — ABNORMAL HIGH (ref 65–99)
Glucose-Capillary: 96 mg/dL (ref 65–99)
Glucose-Capillary: 95 mg/dL (ref 65–99)
Glucose-Capillary: 120 mg/dL — ABNORMAL HIGH (ref 65–99)

## 2017-03-04 LAB — BASIC METABOLIC PANEL
ANION GAP: 11 (ref 5–15)
BUN: 30 mg/dL — ABNORMAL HIGH (ref 6–20)
CALCIUM: 8.3 mg/dL — AB (ref 8.9–10.3)
CO2: 31 mmol/L (ref 22–32)
Chloride: 100 mmol/L — ABNORMAL LOW (ref 101–111)
Creatinine, Ser: 0.55 mg/dL — ABNORMAL LOW (ref 0.61–1.24)
Glucose, Bld: 119 mg/dL — ABNORMAL HIGH (ref 65–99)
POTASSIUM: 3.3 mmol/L — AB (ref 3.5–5.1)
SODIUM: 142 mmol/L (ref 135–145)

## 2017-03-04 LAB — MAGNESIUM: MAGNESIUM: 1.7 mg/dL (ref 1.7–2.4)

## 2017-03-04 LAB — POTASSIUM: Potassium: 4 mmol/L (ref 3.5–5.1)

## 2017-03-04 MED ORDER — MAGNESIUM SULFATE 2 GM/50ML IV SOLN
2.0000 g | Freq: Once | INTRAVENOUS | Status: AC
  Administered 2017-03-04: 2 g via INTRAVENOUS
  Filled 2017-03-04: qty 50

## 2017-03-04 MED ORDER — POTASSIUM CHLORIDE 20 MEQ/15ML (10%) PO SOLN
30.0000 meq | ORAL | Status: AC
  Administered 2017-03-04 (×2): 30 meq
  Filled 2017-03-04 (×2): qty 30

## 2017-03-04 NOTE — Progress Notes (Signed)
PULMONARY / CRITICAL CARE MEDICINE   Name: [name] MRN: [id number] DOB: [birth year]    ADMISSION DATE:  02/16/2017 CONSULTATION DATE:  02/16/2017  REFERRING MD:  Regenia Skeeter  CHIEF COMPLAINT:  DYspnea  HISTORY OF PRESENT ILLNESS:   62y/o male with a history of lymphoma in remission, last treated in 2012 who was admitted with ARDS due to influenza , found to have new severe systolic congestive heart failure with an EF of 25%  STUDIES:  2/8 echo > EF 25-30% moderately dilated left ventricle, diffuse hypokinesis mild mitral valve regurgitation, left atrium mildly dilated  CULTURES: 2/7 BCx 2 >> negative 2/7 RVP >> influenza A 2/7 Sputum cx >> normal flora 02/18/2017 BAL>> negative  ANTIBIOTICS: Levaquin 2/3 >2/5; 2/7 x 1 Vancomycin 2/7 >> 2/12 Azactam 2/7 >> 2/12 Tamiflu 2/8 > completed   SIGNIFICANT EVENTS: 2/7 Admit 02/18/2017 intubated for acute respiratory failure  LINES/TUBES: Left chest port >> accessed 2/7 02/18/2017 orotracheal tube>>2/14 02/18/2017 orogastric tube>>2/14 Trach 2/14>>>   SUBJECTIVE: Chronic critically ill, on vent O2 satn 97% on PEEP 8 Appears better rested, decreased cough   VITAL SIGNS: BP 137/78 (BP Location: Left Wrist)   Pulse 85   Temp 98.2 F (36.8 C) (Oral)   Resp 19   Ht _0  (1.803 m)   Wt (!) 317 lb 7.4 oz (144 kg)   SpO2 96%   BMI 44.28 kg/m   HEMODYNAMICS:    VENTILATOR SETTINGS: Vent Mode: PRVC FiO2 (%):  [50 %-98 %] 50 % Set Rate:  [14 bmp] 14 bmp Vt Set:  [600 mL] 600 mL PEEP:  [8 cmH20] 8 cmH20 Pressure Support:  [10 cmH20] 10 cmH20 Plateau Pressure:  [19 cmH20-20 cmH20] 19 cmH20 Filed Weights   03/02/17 0500 03/03/17 0500 03/04/17 0455  Weight: (!) 370 lb 2.4 oz (167.9 kg) (!) 345 lb 10.9 oz (156.8 kg) (!) 317 lb 7.4 oz (144 kg)   INTAKE / OUTPUT:  Intake/Output Summary (Last 24 hours) at 03/04/2017 0841 Last data filed at 03/04/2017 0700 Gross per 24 hour  Intake 2410 ml  Output 5625 ml  Net -3215 ml     PHYSICAL EXAMINATION: General: Morbidly obese man, ventilated, awake and alert HEENT: Trach in place, #6 cuffed tracheostomy, able to talk around trach Pulmonary: resps even non labored on full support, coarse, diminished bases, few scattered wheezes  Cardiac: Irregularly irregular, no murmur Abdomen: Obese, soft, positive bowel sounds, distended Extremities/musculoskeletal: No deformities, chronic venous stasis changes bilateral lower extremities, 1-2+ BLE edema  Neuro/psych: Calm,interactive, no sedation  LABS:  BMET Recent Labs  Lab 03/02/17 0443 03/03/17 0508 03/04/17 0442  NA 146* 146* 142  K 3.4* 3.2* 3.3*  CL 104 104 100*  CO2 _1 BUN 52* 41* 30*  CREATININE 0.72 0.64 0.55*  GLUCOSE 134* 115* 119*    Electrolytes Recent Labs  Lab 02/26/17 0429 02/27/17 0535  03/02/17 0443 03/03/17 0508 03/04/17 0442  CALCIUM 8.0* 8.1*   < > 8.4* 8.5* 8.3*  MG 2.2 2.4   < > 2.3 1.9 1.7  PHOS 3.5 3.1  --   --   --   --    < > = values in this interval not displayed.    CBC Recent Labs  Lab 03/02/17 0443 03/03/17 0508 03/04/17 0442  WBC 10.4 11.2* 10.9*  HGB 12.8* 13.6 13.1  HCT 40.9 42.4 41.1  PLT 193 194 201    Coag's No results for input(s): APTT, INR in the last 168  hours.  Sepsis Markers No results for input(s): LATICACIDVEN, PROCALCITON, O2SATVEN in the last 168 hours.  ABG Recent Labs  Lab 02/28/17 1442 02/28/17 1628 03/01/17 1109  PHART 7.520* 7.513* 7.497*  PCO2ART 47.1 43.8 39.5  PO2ART 68.0* 53.0* 49.0*    Liver Enzymes No results for input(s): AST, ALT, ALKPHOS, BILITOT, ALBUMIN in the last 168 hours.  Cardiac Enzymes No results for input(s): TROPONINI, PROBNP in the last 168 hours.  Glucose Recent Labs  Lab 03/03/17 1139 03/03/17 1534 03/03/17 1954 03/03/17 2345 03/04/17 0330 03/04/17 0735  GLUCAP 132* 129* 118* 123* 116* 96    Imaging No results found.   DISCUSSION: Making good progress with  weaning  ASSESSMENT / PLAN:  ARDS due to Influenza A c/b Acute pulmonary edema.    s/p trach.   Plan Okay to wean on PEEP of 8, goal trach collar 2-4h today S/p full course abx and tamiflu   Acute systolic HF w/ pulmonary edema (EF 25%) Atrial fib   Plan Off milrinone  Continue  ASA, lipitor , Eliquis started by radiology Continue carvedilol , ARB added   Hypomagnesemia & hypernatremia -resolved Hypokalemia Plan Continue free water -346m q6h Replace K as needed Trend BMET daily  Mild thrombocytopenia-->improved History of non-hodgkins lymphoma Plan Follow CBC Monitor for bleeding  Bilateral foot wounds Plan Wound care Foot drop boots   DM2   Plan Follow CBG, sliding scale insulin per protocol  Deconditioning  -anticipate we should be able to liberate him from the ventilator, will need extensive rehab -LTAC vs CIR   FAMILY  Updated patient and family at bedside 2/23.     DVT prophylaxis: eliquis SUP: ppi Diet: tubefeeds Activity: BR Disposition : ICU    The patient is critically ill with multiple organ systems failure and requires high complexity decision making for assessment and support, frequent evaluation and titration of therapies, application of advanced monitoring technologies and extensive interpretation of multiple databases. Critical Care Time devoted to patient care services described in this note independent of APP/resident  time is 31 minutes.    RKara MeadMD. FShade Flood  Pulmonary & Critical care Pager [phone] no response call [phone]    03/04/2017  8:41 AM

## 2017-03-04 NOTE — Progress Notes (Signed)
Progress Note  Patient Name: [name] Date of Encounter: 03/04/2017  Primary Cardiologist: No primary care provider on file.   Subjective   Vent. Awake, wife in room. Trach. Asking ?[name] Kitchen No CP, baseline SOB.    Inpatient Medications    Scheduled Meds: . amLODipine  5 mg Per Tube Daily  . apixaban  5 mg Per Tube BID  . aspirin  81 mg Per Tube Daily  . atorvastatin  20 mg Per Tube q1800  . carvedilol  25 mg Per Tube BID WC  . chlorhexidine gluconate (MEDLINE KIT)  15 mL Mouth Rinse BID  . Chlorhexidine Gluconate Cloth  6 each Topical Daily  . clonazePAM  0.5 mg Per Tube QHS  . dextromethorphan  15 mg Per Tube BID  . feeding supplement (PRO-STAT SUGAR FREE 64)  60 mL Per Tube TID  . free water  300 mL Per Tube Q6H  . furosemide  40 mg Per Tube BID  . insulin aspart  0-15 Units Subcutaneous Q4H  . ipratropium-albuterol  3 mL Nebulization TID  . losartan  50 mg Per Tube Daily  . mouth rinse  15 mL Mouth Rinse QID  . Melatonin  6 mg Oral QHS  . mupirocin cream   Topical Daily  . pantoprazole sodium  40 mg Per Tube Q1200  . potassium chloride  30 mEq Per Tube Q4H  . sodium chloride flush  10-40 mL Intracatheter Q12H   Continuous Infusions: . sodium chloride Stopped (03/03/17 1500)  . feeding supplement (VITAL HIGH PROTEIN) 1,000 mL (03/03/17 0525)   PRN Meds: Place/Maintain arterial line **AND** sodium chloride, acetaminophen, fentaNYL (SUBLIMAZE) injection, hydrALAZINE, ipratropium-albuterol, morphine injection, ondansetron (ZOFRAN) IV, sodium chloride flush, sodium chloride flush   Vital Signs    Vitals:   03/04/17 0734 03/04/17 0849 03/04/17 0859 03/04/17 1031  BP:  (!) 123/59  120/88  Pulse:  84    Resp:  (!) 23    Temp: 98.2 F (36.8 C)     TempSrc: Oral     SpO2:  93% 93%   Weight:      Height:        Intake/Output Summary (Last 24 hours) at 03/04/2017 1112 Last data filed at 03/04/2017 1035 Gross per 24 hour  Intake 2360 ml  Output 5275 ml  Net -2915  ml   Filed Weights   03/02/17 0500 03/03/17 0500 03/04/17 0455  Weight: (!) 370 lb 2.4 oz (167.9 kg) (!) 345 lb 10.9 oz (156.8 kg) (!) 317 lb 7.4 oz (144 kg)    Telemetry    AFIB 90-110 - Personally Reviewed  ECG    No new ecg - Personally Reviewed  Physical Exam   GEN: No acute distress.  Obese Neck: No JVD Cardiac: irreg, no murmurs, rubs, or gallops.  Respiratory: distant BS, trach. GI: Soft, nontender, non-distended  MS: chronic LE edema; No deformity. Neuro:  Nonfocal  Psych: Normal affect   Labs    Chemistry Recent Labs  Lab 03/02/17 0443 03/03/17 0508 03/04/17 0442  NA 146* 146* 142  K 3.4* 3.2* 3.3*  CL 104 104 100*  CO2 _0 GLUCOSE 134* 115* 119*  BUN 52* 41* 30*  CREATININE 0.72 0.64 0.55*  CALCIUM 8.4* 8.5* 8.3*  GFRNONAA >60 >60 >60  GFRAA >60 >60 >60  ANIONGAP _1 Hematology Recent Labs  Lab 03/02/17 0443 03/03/17 0508 03/04/17 0442  WBC 10.4 11.2* 10.9*  RBC 4.36 4.54  4.38  HGB 12.8* 13.6 13.1  HCT 40.9 42.4 41.1  MCV 93.8 93.4 93.8  MCH 29.4 30.0 29.9  MCHC 31.3 32.1 31.9  RDW 16.0* 16.0* 16.0*  PLT 193 194 201    Cardiac EnzymesNo results for input(s): TROPONINI in the last 168 hours. No results for input(s): TROPIPOC in the last 168 hours.   BNPNo results for input(s): BNP, PROBNP in the last 168 hours.   DDimer No results for input(s): DDIMER in the last 168 hours.   Radiology    No results found.  Cardiac Studies   ECHO: EF 35%   Assessment & Plan    62 year old with non-Hodgkin's lymphoma stage III treated with chemo and radiation in 2012 now on readmission with hypertension hyperlipidemia peripheral vascular disease, bilateral foot wounds, influenza A admitted with hypoxic respiratory failure with ARDS who had newly discovered acute systolic heart failure with ejection fraction of 25-35% dilated cardiomyopathy.  Also had new onset atrial fibrillation.  Acute systolic heart failure/dilated  cardiomyopathy -New diagnosis, unclear duration.  Previously on milrinone. - Carvedilol 25 twice daily, losartan 50 mg a day, Lasix. Continue PO lasix  New onset persistent atrial fibrillation -Currently well rate controlled on carvedilol.  Risk score is at least 3.  Continue with Eliquis for stroke prevention  Essential hypertension -Also on amlodipine in addition to medications described above.  Continue to work on titrating up angiotensin receptor blocker.  Influenza A/acute respiratory hypoxic failure  -Long rehab course likely.  No new changes.   For questions or updates, please contact [place] Please consult [email or web address] for contact info under Cardiology/STEMI.      Signed, Candee Furbish, MD  03/04/2017, 11:12 AM

## 2017-03-05 LAB — GLUCOSE, CAPILLARY
GLUCOSE-CAPILLARY: 101 mg/dL — AB (ref 65–99)
GLUCOSE-CAPILLARY: 76 mg/dL (ref 65–99)
GLUCOSE-CAPILLARY: 96 mg/dL (ref 65–99)
GLUCOSE-CAPILLARY: 104 mg/dL — AB (ref 65–99)
Glucose-Capillary: 101 mg/dL — ABNORMAL HIGH (ref 65–99)
Glucose-Capillary: 126 mg/dL — ABNORMAL HIGH (ref 65–99)
Glucose-Capillary: 129 mg/dL — ABNORMAL HIGH (ref 65–99)
Glucose-Capillary: 94 mg/dL (ref 65–99)

## 2017-03-05 LAB — CBC
HCT: 41.2 % (ref 39.0–52.0)
HEMOGLOBIN: 13.2 g/dL (ref 13.0–17.0)
MCH: 29.9 pg (ref 26.0–34.0)
MCHC: 32 g/dL (ref 30.0–36.0)
MCV: 93.2 fL (ref 78.0–100.0)
PLATELETS: 242 10*3/uL (ref 150–400)
RBC: 4.42 MIL/uL (ref 4.22–5.81)
RDW: 16.2 % — ABNORMAL HIGH (ref 11.5–15.5)
WBC: 12.2 10*3/uL — ABNORMAL HIGH (ref 4.0–10.5)

## 2017-03-05 LAB — BASIC METABOLIC PANEL
ANION GAP: 10 (ref 5–15)
BUN: 31 mg/dL — AB (ref 6–20)
CO2: 29 mmol/L (ref 22–32)
Calcium: 8.2 mg/dL — ABNORMAL LOW (ref 8.9–10.3)
Chloride: 100 mmol/L — ABNORMAL LOW (ref 101–111)
Creatinine, Ser: 0.55 mg/dL — ABNORMAL LOW (ref 0.61–1.24)
GFR calc Af Amer: >60 mL/min (ref >60)
GFR calc non Af Amer: >60 mL/min (ref >60)
GLUCOSE: 111 mg/dL — AB (ref 65–99)
Potassium: 3.7 mmol/L (ref 3.5–5.1)
Sodium: 139 mmol/L (ref 135–145)

## 2017-03-05 LAB — MAGNESIUM: Magnesium: 1.9 mg/dL (ref 1.7–2.4)

## 2017-03-05 MED ORDER — LORAZEPAM 2 MG/ML PO CONC
1.0000 mg | Freq: Every evening | ORAL | Status: DC | PRN
  Administered 2017-03-05 – 2017-03-06 (×2): 1 mg
  Filled 2017-03-05 (×2): qty 1

## 2017-03-05 MED ORDER — PANCRELIPASE (LIP-PROT-AMYL) 12000-38000 UNITS PO CPEP
24000.0000 [IU] | ORAL_CAPSULE | Freq: Once | ORAL | Status: AC
  Administered 2017-03-05: 24000 [IU] via ORAL
  Filled 2017-03-05 (×2): qty 2

## 2017-03-05 MED ORDER — LORAZEPAM 2 MG/ML IJ SOLN
2.0000 mg | Freq: Once | INTRAMUSCULAR | Status: AC
  Administered 2017-03-05: 2 mg via INTRAVENOUS
  Filled 2017-03-05: qty 1

## 2017-03-05 MED ORDER — SODIUM BICARBONATE 650 MG PO TABS
650.0000 mg | ORAL_TABLET | Freq: Once | ORAL | Status: AC
  Administered 2017-03-05: 650 mg via ORAL
  Filled 2017-03-05: qty 1

## 2017-03-05 NOTE — Progress Notes (Signed)
   No new changes. Chart reviewed.   Candee Furbish, MD

## 2017-03-05 NOTE — Progress Notes (Signed)
Have had to inflate cuff multiple times throughout the night due to massive air leak. Also had to reposition trach by pushing further into neck prior to inflating to help get a cuff seal on ventilator. Patient very uncomfortable and hurting, will inquire about a longer trach this am being placed, possibly a #6xlt Shiley, distal or proximal.

## 2017-03-05 NOTE — Progress Notes (Signed)
Called and spoke to [place], registered dietician regarding pt occluded cortrack. Will initiate unclogging protocol in order sets per [facility] recommendation.

## 2017-03-05 NOTE — Procedures (Signed)
Tracheostomy Change Note  Patient Details:   Name: [name] DOB: 1955-04-08 MRN: [id number]    Airway Documentation:     Evaluation  O2 sats: stable throughout Complications: No apparent complications Patient did tolerate procedure well. Bilateral Breath Sounds: Rhonchi, Diminished    Bayard Beaver 03/05/2017, 11:41 AM

## 2017-03-05 NOTE — Procedures (Addendum)
Tracheostomy Change Note  Patient Details:   Name: [name] DOB: 1955-11-29 MRN: [id number]    Airway Documentation:     Evaluation  O2 sats: stable throughout Complications: No apparent complications Patient did tolerate procedure well. Bilateral Breath Sounds: Rhonchi, Diminished    Changed to a Shiley #6 XLT Distal   Bayard Beaver 03/05/2017, 11:43 AM

## 2017-03-05 NOTE — Progress Notes (Signed)
ANTICOAGULATION CONSULT NOTE - Follow Up Consult  Pharmacy Consult for Eliquis  Indication: atrial fibrillation  Allergies  Allergen Reactions  . Penicillins Swelling    Age 62 or 42 - pt states he had swelling all over and his lips got real big    Patient Measurements: Height: 5\' 11"  (180.3 cm) Weight: (!) 317 lb 7.4 oz (144 kg) IBW/kg (Calculated) : 75.3 Heparin Dosing Weight: 117 kg  Vital Signs: Temp: 98.6 F (37 C) (02/24 0744) Temp Source: Oral (02/24 0744) BP: 114/74 (02/24 0932) Pulse Rate: 87 (02/24 0932)  Labs: Recent Labs    03/03/17 0508 03/04/17 0442 03/05/17 0540  HGB 13.6 13.1 13.2  HCT 42.4 41.1 41.2  PLT 194 201 242  CREATININE 0.64 0.55* 0.55*    Estimated Creatinine Clearance: 141 mL/min (A) (by C-G formula based on SCr of 0.55 mg/dL (L)).  Assessment: 62 year old male with new atrial fibrillation on Eliquis 5 mg twice daily. H/H and Plt remain stable.   Goal of Therapy:  Heparin level 0.3-0.7 units/ml Monitor platelets by anticoagulation protocol: Yes   Plan:  Continue Eliquis 5 mg BID  Monitor CBC and s/s of bleeding   Albertina Parr, PharmD., BCPS Clinical Pharmacist Pager [phone]

## 2017-03-05 NOTE — Progress Notes (Addendum)
PULMONARY / CRITICAL CARE MEDICINE   Name: [name] MRN: [id number] DOB: July 15, 1955    ADMISSION DATE:  02/16/2017 CONSULTATION DATE:  02/16/2017  REFERRING MD:  Regenia Skeeter  CHIEF COMPLAINT:  DYspnea  HISTORY OF PRESENT ILLNESS:   62y/o male with a history of lymphoma in remission, last treated in 2012 who was admitted with ARDS due to influenza , found to have new severe systolic congestive heart failure with an EF of 25%  STUDIES:  2/8 echo > EF 25-30% moderately dilated left ventricle, diffuse hypokinesis mild mitral valve regurgitation, left atrium mildly dilated  CULTURES: 2/7 BCx 2 >> negative 2/7 RVP >> influenza A 02/18/2017 BAL>> negative  ANTIBIOTICS: Levaquin 2/3 >2/5; 2/7 x 1 Vancomycin 2/7 >> 2/12 Azactam 2/7 >> 2/12 Tamiflu 2/8 > completed   SIGNIFICANT EVENTS: 2/7 Admit 02/18/2017 intubated for acute respiratory failure  LINES/TUBES: Left chest port >> accessed 2/7 02/18/2017 orotracheal tube>>2/14 02/18/2017 orogastric tube>>2/14 Trach 2/14>>>   SUBJECTIVE: Chronic critically ill, on vent Rough night - c/o headache , could not sleep - got ativan & morphine Tstomy leak  Around tube decreased cough   VITAL SIGNS: BP 122/89   Pulse 94   Temp 98.6 F (37 C) (Oral)   Resp 18   Ht _0  (1.803 m)   Wt (!) 317 lb 7.4 oz (144 kg)   SpO2 96%   BMI 44.28 kg/m   HEMODYNAMICS:    VENTILATOR SETTINGS: Vent Mode: PRVC FiO2 (%):  [40 %] 40 % Set Rate:  [14 bmp] 14 bmp Vt Set:  [600 mL] 600 mL PEEP:  [8 cmH20] 8 cmH20 Plateau Pressure:  [17 cmH20-21 cmH20] 21 cmH20 Filed Weights   03/02/17 0500 03/03/17 0500 03/04/17 0455  Weight: (!) 370 lb 2.4 oz (167.9 kg) (!) 345 lb 10.9 oz (156.8 kg) (!) 317 lb 7.4 oz (144 kg)   INTAKE / OUTPUT:  Intake/Output Summary (Last 24 hours) at 03/05/2017 0913 Last data filed at 03/05/2017 0800 Gross per 24 hour  Intake 1320 ml  Output 4950 ml  Net -3630 ml    PHYSICAL EXAMINATION: General: Morbidly obese man,  ventilated, awake and alert HEENT: Trach in place, #6 cuffed tracheostomy, able to talk around trach Pulmonary: resps even non labored on full support, coarse, diminished bases, few scattered wheezes  Cardiac: Irregularly irregular, no murmur Abdomen: Obese, soft, positive bowel sounds, distended Extremities/musculoskeletal: No deformities, chronic venous stasis changes bilateral lower extremities, 1-2+ BLE edema  Neuro/psych: Calm,interactive, no sedation  LABS:  BMET Recent Labs  Lab 03/03/17 0508 03/04/17 0442 03/04/17 2045 03/05/17 0540  NA 146* 142  --  139  K 3.2* 3.3* 4.0 3.7  CL 104 100*  --  100*  CO2 31 31  --  29  BUN 41* 30*  --  31*  CREATININE 0.64 0.55*  --  0.55*  GLUCOSE 115* 119*  --  111*    Electrolytes Recent Labs  Lab 02/27/17 0535  03/03/17 0508 03/04/17 0442 03/05/17 0540  CALCIUM 8.1*   < > 8.5* 8.3* 8.2*  MG 2.4   < > 1.9 1.7 1.9  PHOS 3.1  --   --   --   --    < > = values in this interval not displayed.    CBC Recent Labs  Lab 03/03/17 0508 03/04/17 0442 03/05/17 0540  WBC 11.2* 10.9* 12.2*  HGB 13.6 13.1 13.2  HCT 42.4 41.1 41.2  PLT 194 201 242    Coag's No results  for input(s): APTT, INR in the last 168 hours.  Sepsis Markers No results for input(s): LATICACIDVEN, PROCALCITON, O2SATVEN in the last 168 hours.  ABG Recent Labs  Lab 02/28/17 1442 02/28/17 1628 03/01/17 1109  PHART 7.520* 7.513* 7.497*  PCO2ART 47.1 43.8 39.5  PO2ART 68.0* 53.0* 49.0*    Liver Enzymes No results for input(s): AST, ALT, ALKPHOS, BILITOT, ALBUMIN in the last 168 hours.  Cardiac Enzymes No results for input(s): TROPONINI, PROBNP in the last 168 hours.  Glucose Recent Labs  Lab 03/04/17 1129 03/04/17 1600 03/04/17 2004 03/05/17 0004 03/05/17 0414 03/05/17 0746  GLUCAP 112* 95 120* 129* 101* 94    Imaging No results found.   DISCUSSION: Making good progress with weaning  ASSESSMENT / PLAN:  ARDS due to Influenza A  c/b Acute pulmonary edema.    s/p trach.   Plan Okay to wean on PEEP of 8, goal trach collar daytime 4-8h  S/p full course abx and tamiflu  Change to XLT distal trach  Acute systolic HF w/ pulmonary edema (EF 25%) Atrial fib   Plan Off milrinone  Continue  ASA, lipitor , Eliquis  Continue carvedilol , ARB added -cardiology titrating   Hypomagnesemia & hypernatremia -resolved Hypokalemia Plan Continue free water -281m q6h Replace K as needed Trend BMET daily  Mild thrombocytopenia-->improved History of non-hodgkins lymphoma Plan Follow CBC Monitor for bleeding  Bilateral foot wounds Plan Wound care Foot drop boots   DM2   Plan Follow CBG, sliding scale insulin per protocol  Insomnia - dc clonazepam, use ativan instead  Deconditioning  -anticipate we should be able to liberate him from the ventilator, will need extensive rehab -LTAC vs CIR   FAMILY  Updated patient and family at bedside 2/24     DVT prophylaxis: eliquis SUP: ppi Diet: tubefeeds Activity: BR Disposition : ICU, can transfer to SDU after trach change    The patient is critically ill with multiple organ systems failure and requires high complexity decision making for assessment and support, frequent evaluation and titration of therapies, application of advanced monitoring technologies and extensive interpretation of multiple databases. Critical Care Time devoted to patient care services described in this note independent of APP/resident  time is 31 minutes.    RKara MeadMD. FShade [facility] Pager [phone] no response call [phone]    03/05/2017  9:13 AM

## 2017-03-05 NOTE — Progress Notes (Signed)
Attempt to unclog cortrack with warm water, 2nd attempt done using carbonated cola. Significant resistant met with both attempts. Will continue to clamp and maintain cortrack.

## 2017-03-05 NOTE — Progress Notes (Addendum)
Called elink, Dr Deterding informed of clogged cortrack. Will continue to perform ordered cbg checks per Dr Deterding instruction.

## 2017-03-06 LAB — GLUCOSE, CAPILLARY
GLUCOSE-CAPILLARY: 129 mg/dL — AB (ref 65–99)
GLUCOSE-CAPILLARY: 90 mg/dL (ref 65–99)
GLUCOSE-CAPILLARY: 95 mg/dL (ref 65–99)
Glucose-Capillary: 103 mg/dL — ABNORMAL HIGH (ref 65–99)
Glucose-Capillary: 99 mg/dL (ref 65–99)
Glucose-Capillary: 120 mg/dL — ABNORMAL HIGH (ref 65–99)

## 2017-03-06 LAB — BASIC METABOLIC PANEL
ANION GAP: 9 (ref 5–15)
BUN: 30 mg/dL — ABNORMAL HIGH (ref 6–20)
CALCIUM: 8 mg/dL — AB (ref 8.9–10.3)
CO2: 29 mmol/L (ref 22–32)
CREATININE: 0.57 mg/dL — AB (ref 0.61–1.24)
Chloride: 100 mmol/L — ABNORMAL LOW (ref 101–111)
GFR calc non Af Amer: >60 mL/min (ref >60)
Glucose, Bld: 123 mg/dL — ABNORMAL HIGH (ref 65–99)
Potassium: 3.4 mmol/L — ABNORMAL LOW (ref 3.5–5.1)
SODIUM: 138 mmol/L (ref 135–145)

## 2017-03-06 LAB — CBC
HEMATOCRIT: 37.4 % — AB (ref 39.0–52.0)
Hemoglobin: 11.7 g/dL — ABNORMAL LOW (ref 13.0–17.0)
MCH: 29.5 pg (ref 26.0–34.0)
MCHC: 31.3 g/dL (ref 30.0–36.0)
MCV: 94.2 fL (ref 78.0–100.0)
Platelets: 216 10*3/uL (ref 150–400)
RBC: 3.97 MIL/uL — ABNORMAL LOW (ref 4.22–5.81)
RDW: 16.3 % — ABNORMAL HIGH (ref 11.5–15.5)
WBC: 11.2 10*3/uL — AB (ref 4.0–10.5)

## 2017-03-06 LAB — MAGNESIUM: MAGNESIUM: 1.7 mg/dL (ref 1.7–2.4)

## 2017-03-06 MED ORDER — MORPHINE SULFATE (PF) 2 MG/ML IV SOLN: 1.0000 mg | INTRAVENOUS | Status: DC | PRN

## 2017-03-06 MED ORDER — MAGNESIUM SULFATE 2 GM/50ML IV SOLN
2.0000 g | Freq: Once | INTRAVENOUS | Status: AC
  Administered 2017-03-06: 2 g via INTRAVENOUS
  Filled 2017-03-06: qty 50

## 2017-03-06 MED ORDER — POTASSIUM CHLORIDE 20 MEQ/15ML (10%) PO SOLN
40.0000 meq | Freq: Two times a day (BID) | ORAL | Status: AC
  Administered 2017-03-06 – 2017-03-07 (×3): 40 meq
  Filled 2017-03-06 (×3): qty 30

## 2017-03-06 MED ORDER — TRAMADOL HCL 50 MG PO TABS
50.0000 mg | ORAL_TABLET | Freq: Four times a day (QID) | ORAL | Status: DC | PRN
  Administered 2017-03-06 – 2017-03-07 (×2): 50 mg via ORAL
  Filled 2017-03-06 (×2): qty 1

## 2017-03-06 MED ORDER — ACETAMINOPHEN 325 MG PO TABS
650.0000 mg | ORAL_TABLET | Freq: Four times a day (QID) | ORAL | Status: DC | PRN
  Administered 2017-03-06 (×2): 650 mg
  Filled 2017-03-06 (×2): qty 2

## 2017-03-06 MED ORDER — ACETAMINOPHEN 325 MG PO TABS: 650.0000 mg | ORAL_TABLET | Freq: Four times a day (QID) | ORAL | Status: DC | PRN

## 2017-03-06 MED ORDER — BUTALBITAL-APAP-CAFFEINE 50-325-40 MG PO TABS
1.0000 | ORAL_TABLET | Freq: Four times a day (QID) | ORAL | Status: DC | PRN
  Administered 2017-03-07: 1 via ORAL
  Filled 2017-03-06: qty 1

## 2017-03-06 NOTE — Plan of Care (Signed)
  Progressing Education: Knowledge of General Education information will improve 03/06/2017 1727 - Progressing by Wilma Flavin, RN Clinical Measurements: Respiratory complications will improve 03/06/2017 1727 - Progressing by Wilma Flavin, RN Nutrition: Adequate nutrition will be maintained 03/06/2017 1727 - Progressing by Wilma Flavin, RN Elimination: Will not experience complications related to bowel motility 03/06/2017 1727 - Progressing by Wilma Flavin, RN Respiratory: Patent airway maintenance will improve 03/06/2017 1727 - Progressing by Wilma Flavin, RN Role Relationship: Ability to communicate will improve 03/06/2017 1727 - Progressing by Wilma Flavin, RN

## 2017-03-06 NOTE — Evaluation (Signed)
Passy-Muir Speaking Valve - Evaluation Patient Details  Name: [name] MRN: [id number] Date of Birth: February 19, 1955  Today's Date: 03/06/2017 Time: 1345-1415 SLP Time Calculation (min) (ACUTE ONLY): 30 min  Past Medical History:  Past Medical History:  Diagnosis Date  . Dyslipidemia   . Gout attack 01/16/2012  . Hypertension   . nhl dx'd 01/2010  . Venous insufficiency    Past Surgical History:  Past Surgical History:  Procedure Laterality Date  . BRONCHOSCOPY    . ESOPHAGOGASTRODUODENOSCOPY ENDOSCOPY     Dr Jearld Fenton  . INGUINAL HERNIA REPAIR     2001  . PORT-A-CATH REMOVAL     HPI:  62y/o male with a history of lymphoma in remission(last treated in 2012)who was admitted with ARDS due to influenza, andfound to have new severe systolic congestive heart failure with an EF of 25%.Pt was intubated on 2/9, trach placed at bedside on 2/14. #6 distal XLT placed on 2/24 due to cuff leak. Vent setting on 2/25: CPAP/PS FiO2 40%; PEEP 8; Pt has Cortrak.    Assessment / Plan / Recommendation Clinical Impression  Pt tolerated trials of cuff deflation and PMSV inline with the vent with cotreatment with RT. Pt alert, able to follow commands and communicate with overarticulation at baseline. Ventilator on CPAP/PS mode at time of assessment with stable vitals; PEEP dropped from 8 to 3. RT deflated cuff and SLP and RT provided verbal cues for oral expectoration of secretions while also getting deep suction. Immediate decrease in exhaled volume observed. With PMSV in place pt cleared upper airway secretions with min verbal cues. Communicated at conversation level with breathy hoarse vocal quality 2-3 words per breath. Provided rationale for dysphonia, need for PMSV, educated pt on plan of care for voice and swallowing. Pt demonstrated ability to initiate volitional swallow though subjective weakness noted. Encouraged opportunities for effortful swallowing of secretions. Will f/u for PMSV in line or with  trach collar as pt progresses. Also expect FEES sometime this week.  SLP Visit Diagnosis: Aphonia (R49.1)    SLP Assessment  Patient needs continued Speech Lanaguage Pathology Services    Follow Up Recommendations       Frequency and Duration min 3x week  2 weeks    PMSV Trial PMSV was placed for: (15 mintues) Able to redirect subglottic air through upper airway: Yes Able to Attain Phonation: Yes Voice Quality: Breathy;Hoarse Able to Expectorate Secretions: Yes Level of Secretion Expectoration with PMSV: Oral Breath Support for Phonation: Moderately decreased Intelligibility: Intelligible SpO2 During Trial: (93)   Tracheostomy Tube       Vent Dependency  Vent Dependent: Yes Vent Mode: CPAP PEEP: 8 cmH20 Pressure Support: 5 cmH20 FiO2 (%): 40 %    Cuff Deflation Trial  GO Tolerated Cuff Deflation: Yes Length of Time for Cuff Deflation Trial: 15 minutes Behavior: Alert;Cooperative;Expresses self well        Kanchan Gal, Katherene Ponto 03/06/2017, 2:38 PM

## 2017-03-06 NOTE — Progress Notes (Signed)
This RN witnessed SO attempted to remove contact lens from patient's eyes. Upon reassessment patient's left eye appeared to be red and irritated. Will continue to monitor and assess.

## 2017-03-06 NOTE — Progress Notes (Addendum)
PULMONARY / CRITICAL CARE MEDICINE   Name: [name] MRN: [id number] DOB: January 03, 1956    ADMISSION DATE:  02/16/2017 CONSULTATION DATE:  02/16/2017  REFERRING MD:  Regenia Skeeter  CHIEF COMPLAINT:  DYspnea  HISTORY OF PRESENT ILLNESS:   62y/o male with a history of lymphoma in remission, last treated in 2012 who was admitted with ARDS due to influenza , found to have new severe systolic congestive heart failure with an EF of 25%  SUBJECTIVE: Trach changed to #6 shiley XLT distal yesterday secondary to cuff leak Slept better overnight Complaints of coughing spells Tolerating weaning PSV 13/8  STUDIES:  2/8 echo > EF 25-30% moderately dilated left ventricle, diffuse hypokinesis mild mitral valve regurgitation, left atrium mildly dilated  CULTURES: 2/7 BCx 2 >> negative 2/7 RVP >> influenza A 02/18/2017 BAL>> negative  ANTIBIOTICS: Levaquin 2/3 >2/5; 2/7 x 1 Vancomycin 2/7 >> 2/12 Azactam 2/7 >> 2/12 Tamiflu 2/8 > completed   SIGNIFICANT EVENTS: 2/7 Admit 02/18/2017 intubated for acute respiratory failure 02/18/2017 trach 2/25 tx sdu/trach changed  LINES/TUBES: Left chest port >> accessed 2/7 02/18/2017 orotracheal tube>>2/14 02/18/2017 orogastric tube>>2/14 Trach 2/14>>> changed to #6 shiley XLT distal 2/24   VITAL SIGNS: BP 98/73   Pulse 82   Temp 99 F (37.2 C) (Oral)   Resp (!) 23   Ht _0  (1.803 m)   Wt (!) 317 lb 7.4 oz (144 kg)   SpO2 95%   BMI 44.28 kg/m   HEMODYNAMICS:    VENTILATOR SETTINGS: Vent Mode: PRVC FiO2 (%):  [40 %] 40 % Set Rate:  [14 bmp] 14 bmp Vt Set:  [600 mL] 600 mL PEEP:  [8 cmH20] 8 cmH20 Plateau Pressure:  [18 cmH20-22 cmH20] 19 cmH20 Filed Weights   03/02/17 0500 03/03/17 0500 03/04/17 0455  Weight: (!) 370 lb 2.4 oz (167.9 kg) (!) 345 lb 10.9 oz (156.8 kg) (!) 317 lb 7.4 oz (144 kg)   INTAKE / OUTPUT:  Intake/Output Summary (Last 24 hours) at 03/06/2017 2947 Last data filed at 03/06/2017 0800 Gross per 24 hour  Intake 1365.83  ml  Output 2850 ml  Net -1484.17 ml    PHYSICAL EXAMINATION: General:  Adult male, morbidly obese, in NAD  HEENT: R nare cortrak, #6 cuffed shiley XLT distal Neuro: Awake, alert, mouths communications CV: IRIR PULM: even/non-labored on PSV, diffusely coarse, pt did not want me to suction him. GI: obese, +bs, soft Extremities: warm/dry, BLE chronic venous stasis w/+1 edema Skin: no rashes   LABS:  BMET Recent Labs  Lab 03/04/17 0442 03/04/17 2045 03/05/17 0540 03/06/17 0630  NA 142  --  139 138  K 3.3* 4.0 3.7 3.4*  CL 100*  --  100* 100*  CO2 31  --  29 29  BUN 30*  --  31* 30*  CREATININE 0.55*  --  0.55* 0.57*  GLUCOSE 119*  --  111* 123*    Electrolytes Recent Labs  Lab 03/04/17 0442 03/05/17 0540 03/06/17 0630  CALCIUM 8.3* 8.2* 8.0*  MG 1.7 1.9 1.7    CBC Recent Labs  Lab 03/04/17 0442 03/05/17 0540 03/06/17 0606  WBC 10.9* 12.2* 11.2*  HGB 13.1 13.2 11.7*  HCT 41.1 41.2 37.4*  PLT 201 242 216    Coag's No results for input(s): APTT, INR in the last 168 hours.  Sepsis Markers No results for input(s): LATICACIDVEN, PROCALCITON, O2SATVEN in the last 168 hours.  ABG Recent Labs  Lab 02/28/17 1442 02/28/17 1628 03/01/17 1109  PHART 7.520*  7.513* 7.497*  PCO2ART 47.1 43.8 39.5  PO2ART 68.0* 53.0* 49.0*    Liver Enzymes No results for input(s): AST, ALT, ALKPHOS, BILITOT, ALBUMIN in the last 168 hours.  Cardiac Enzymes No results for input(s): TROPONINI, PROBNP in the last 168 hours.  Glucose Recent Labs  Lab 03/05/17 1156 03/05/17 1556 03/05/17 1955 03/05/17 2359 03/06/17 0349 03/06/17 0745  GLUCAP 76 96 104* 126* 95 90    Imaging No results found.   DISCUSSION: Making good progress with weaning  ASSESSMENT / PLAN:  ARDS due to Influenza A c/b Acute pulmonary edema 2/2 acute systolic HF EF 62% s/p trach.  - S/p full course abx and tamiflu  - 2/24 changed to #6 shiley XLT distal trach  Plan Daily PSV, ok to wean  on PEEP 8 Goal TC daytime 4-8 hrs, rest on PRVC 14/ 8cc/kg Goal O2 saturation > 92% Trach care per protocol  Anticipate we should be able to liberate him from the ventilator, will need extensive rehab -LTAC vs CIR secondary to deconditioning  Diuresis per primary team as BP/ sCr tolerates; cards following  PT for bronchial hygiene per primary team    Remainder per primary team  Acute systolic HF w/ pulmonary edema (EF 25%) Atrial fib  Hypomagnesemia & hypernatremia -resolved Hypokalemia Mild thrombocytopenia-->improved History of non-hodgkins lymphoma Bilateral foot wounds DM2   Insomnia  Deconditioning    FAMILY  Updated patient and family at bedside 2/25   DVT prophylaxis: eliquis SUP: ppi Diet: tubefeeds Activity: BR w/PT  Disposition :SDU  PCCM will continue to follow.  Will plan to see on 2/28 or sooner if needed, please call.   Kennieth Rad, AGACNP-BC [facility] [facility] Pgr: [phone] or if no answer [phone] 03/06/2017, 9:45 AM  Attending note:  62 year old male with flu A and ARDS that is now trached.  On exam, coarse BS diffusely.  I reviewed CXR myself, trach is in good position.  Discussed with PCCM-NP and TRH-MD.  Respiratory failure:  - PS trials as able  - Decrease PEEP to 5  - Hold off TC for now  Trach status:  - Maintain current trach size and type  - Ok to take sutures out  Hypoxemia:  - Titrate O2 for sat of 88-92%  PCCM will continue to follow.  Patient seen and examined, agree with above note.  I dictated the care and orders written for this patient under my direction.  Rush Farmer, [facility]

## 2017-03-06 NOTE — Progress Notes (Signed)
[facility] TEAM 1 - Stepdown/ICU TEAM  [name]  ERD:[id number] DOB: 1955-09-08 DOA: 02/16/2017 PCP: [name], MD    Brief Narrative:  62y/o male with a history of lymphoma in remission (last treated in 2012) who was admitted with ARDS due to influenza, and found to have new severe systolic congestive heart failure with an EF of 25%.    Significant Events: 2/7 admit  2/8 TTE - EF 25-30% - diffuse hypokinesis - mild mitral regurg 2/9 intubated  2/14 tracheostomy  2/24 trach changed to #6 distal XLT due to cuff leak   Subjective: Resting comfortably in bed.  Continues to have difficulty w/ insomnia.  Denies cp, n/v, or abdom pain.  Having frequent coughing spells.    Assessment & Plan:  ARDS due to Influenza A   s/p trach - trach and vent care per PCCM - weaning at present   Acute pulmonary edema due to Acute systolic CHF (EF 56%) Cardiology following - unclear duration/new diagnosis - diuretic and coronary eval as per Cards   Atrial fib  On eliquis - rate controlled   Hypomagnesemia Supplement and follow  Hypokalemia Correct to goal of 4.0   History of Non-Hodgkins lymphoma  Bilateral foot wounds Wound care  DM2   CBG currently well controlled   Deconditioning will need extensive rehab (LTAC vs CIR)  Morbid obesity - Body mass index is 44.28 kg/m.   DVT prophylaxis: Eliquis  Code Status: FULL CODE Family Communication: spoke w/ wife at bedside  Disposition Plan:   Consultants:  PCCM Cardiology   Antimicrobials:  Levaquin 2/3 >2/5 + 2/7  Vancomycin 2/7 > 2/12 Azactam 2/7 >2/12  Objective: Blood pressure 103/78, pulse 82, temperature 98.1 F (36.7 C), temperature source Oral, resp. rate (!) 23, height 5' 11"  (1.803 m), weight (!) 144 kg (317 lb 7.4 oz), SpO2 95 %.  Intake/Output Summary (Last 24 hours) at 03/06/2017 1036 Last data filed at 03/06/2017 1000 Gross per 24 hour  Intake 1495.83 ml  Output 3550 ml  Net -2054.17 ml   Filed  Weights   03/02/17 0500 03/03/17 0500 03/04/17 0455  Weight: (!) 167.9 kg (370 lb 2.4 oz) (!) 156.8 kg (345 lb 10.9 oz) (!) 144 kg (317 lb 7.4 oz)    Examination: General: No acute respiratory distress Lungs: Clear to auscultation bilaterally - poor air movement B bases  Cardiovascular: irreg irreg - no M or rub - rate controlled  Abdomen: Nontender, morbidly obese, soft, bowel sounds positive, no rebound, no ascites, no appreciable mass Extremities: 1+ B LE edema   CBC: Recent Labs  Lab 03/02/17 0443 03/03/17 0508 03/04/17 0442 03/05/17 0540 03/06/17 0606  WBC 10.4 11.2* 10.9* 12.2* 11.2*  HGB 12.8* 13.6 13.1 13.2 11.7*  HCT 40.9 42.4 41.1 41.2 37.4*  MCV 93.8 93.4 93.8 93.2 94.2  PLT 193 194 201 242 314   Basic Metabolic Panel: Recent Labs  Lab 03/02/17 0443 03/03/17 0508 03/04/17 0442 03/04/17 2045 03/05/17 0540 03/06/17 0630  NA 146* 146* 142  --  139 138  K 3.4* 3.2* 3.3* 4.0 3.7 3.4*  CL 104 104 100*  --  100* 100*  CO2 30 31 31   --  29 29  GLUCOSE 134* 115* 119*  --  111* 123*  BUN 52* 41* 30*  --  31* 30*  CREATININE 0.72 0.64 0.55*  --  0.55* 0.57*  CALCIUM 8.4* 8.5* 8.3*  --  8.2* 8.0*  MG 2.3 1.9 1.7  --  1.9  1.7   GFR: Estimated Creatinine Clearance: 141 mL/min (A) (by C-G formula based on SCr of 0.57 mg/dL (L)).  Liver Function Tests: No results for input(s): AST, ALT, ALKPHOS, BILITOT, PROT, ALBUMIN in the last 168 hours. No results for input(s): LIPASE, AMYLASE in the last 168 hours. No results for input(s): AMMONIA in the last 168 hours.  HbA1C: Hgb A1c MFr Bld  Date/Time Value Ref Range Status  02/16/2017 11:00 PM 6.3 (H) 4.8 - 5.6 % Final    Comment:    (NOTE) Pre diabetes:          5.7%-6.4% Diabetes:              >6.4% Glycemic control for   <7.0% adults with diabetes     CBG: Recent Labs  Lab 03/05/17 1556 03/05/17 1955 03/05/17 2359 03/06/17 0349 03/06/17 0745  GLUCAP 96 104* 126* 95 90     Scheduled Meds: .  amLODipine  5 mg Per Tube Daily  . apixaban  5 mg Per Tube BID  . aspirin  81 mg Per Tube Daily  . atorvastatin  20 mg Per Tube q1800  . carvedilol  25 mg Per Tube BID WC  . chlorhexidine gluconate (MEDLINE KIT)  15 mL Mouth Rinse BID  . Chlorhexidine Gluconate Cloth  6 each Topical Daily  . feeding supplement (PRO-STAT SUGAR FREE 64)  60 mL Per Tube TID  . free water  300 mL Per Tube Q6H  . furosemide  40 mg Per Tube BID  . insulin aspart  0-15 Units Subcutaneous Q4H  . ipratropium-albuterol  3 mL Nebulization TID  . losartan  50 mg Per Tube Daily  . mouth rinse  15 mL Mouth Rinse QID  . Melatonin  6 mg Oral QHS  . mupirocin cream   Topical Daily  . pantoprazole sodium  40 mg Per Tube Q1200  . sodium chloride flush  10-40 mL Intracatheter Q12H     LOS: 18 days   Cherene Altes, MD [facility] Office  [phone] Pager - Text Page per Amion as per below:  On-Call/Text Page:      [email or web address]      password [email or web address]  If 7PM-7AM, please contact night-coverage [email or web address] Password [facility] 03/06/2017, 10:36 AM

## 2017-03-06 NOTE — Progress Notes (Signed)
RT assisted SP with passy muir valve trial on trach.  Pt tolerated well. Following trial, pt placed back on wean mode on vent.

## 2017-03-07 LAB — CBC
HCT: 36.3 % — ABNORMAL LOW (ref 39.0–52.0)
HEMOGLOBIN: 11.6 g/dL — AB (ref 13.0–17.0)
MCH: 29.9 pg (ref 26.0–34.0)
MCHC: 32 g/dL (ref 30.0–36.0)
MCV: 93.6 fL (ref 78.0–100.0)
PLATELETS: 223 10*3/uL (ref 150–400)
RBC: 3.88 MIL/uL — AB (ref 4.22–5.81)
RDW: 16.5 % — ABNORMAL HIGH (ref 11.5–15.5)
WBC: 9.9 10*3/uL (ref 4.0–10.5)

## 2017-03-07 LAB — COMPREHENSIVE METABOLIC PANEL
ALBUMIN: 1.9 g/dL — AB (ref 3.5–5.0)
ALT: 42 U/L (ref 17–63)
AST: 33 U/L (ref 15–41)
Alkaline Phosphatase: 98 U/L (ref 38–126)
Anion gap: 9 (ref 5–15)
BUN: 30 mg/dL — AB (ref 6–20)
CHLORIDE: 100 mmol/L — AB (ref 101–111)
CO2: 28 mmol/L (ref 22–32)
CREATININE: 0.57 mg/dL — AB (ref 0.61–1.24)
Calcium: 8 mg/dL — ABNORMAL LOW (ref 8.9–10.3)
GFR calc Af Amer: >60 mL/min (ref >60)
Glucose, Bld: 114 mg/dL — ABNORMAL HIGH (ref 65–99)
POTASSIUM: 3.9 mmol/L (ref 3.5–5.1)
SODIUM: 137 mmol/L (ref 135–145)
Total Bilirubin: 2 mg/dL — ABNORMAL HIGH (ref 0.3–1.2)
Total Protein: 5.6 g/dL — ABNORMAL LOW (ref 6.5–8.1)

## 2017-03-07 LAB — GLUCOSE, CAPILLARY
GLUCOSE-CAPILLARY: 103 mg/dL — AB (ref 65–99)
GLUCOSE-CAPILLARY: 104 mg/dL — AB (ref 65–99)
GLUCOSE-CAPILLARY: 110 mg/dL — AB (ref 65–99)
GLUCOSE-CAPILLARY: 129 mg/dL — AB (ref 65–99)
GLUCOSE-CAPILLARY: 98 mg/dL (ref 65–99)
Glucose-Capillary: 117 mg/dL — ABNORMAL HIGH (ref 65–99)

## 2017-03-07 LAB — MAGNESIUM: MAGNESIUM: 1.9 mg/dL (ref 1.7–2.4)

## 2017-03-07 MED ORDER — KETOCONAZOLE 2 % EX CREA
TOPICAL_CREAM | Freq: Two times a day (BID) | CUTANEOUS | Status: DC
  Administered 2017-03-07: 10:00:00 via TOPICAL
  Filled 2017-03-07: qty 15

## 2017-03-07 MED ORDER — CLINDAMYCIN HCL 300 MG PO CAPS
300.0000 mg | ORAL_CAPSULE | Freq: Four times a day (QID) | ORAL | Status: DC
  Administered 2017-03-07: 300 mg via ORAL
  Filled 2017-03-07 (×4): qty 1

## 2017-03-07 MED ORDER — LORAZEPAM 2 MG/ML PO CONC
0.5000 mg | ORAL | Status: DC | PRN
  Administered 2017-03-10: 1 mg
  Filled 2017-03-07 (×2): qty 1

## 2017-03-07 MED ORDER — FEBUXOSTAT 40 MG PO TABS
40.0000 mg | ORAL_TABLET | Freq: Every day | ORAL | Status: DC
  Administered 2017-03-07: 40 mg via ORAL
  Filled 2017-03-07 (×2): qty 1

## 2017-03-07 MED ORDER — FREE WATER
200.0000 mL | Freq: Four times a day (QID) | Status: DC
  Administered 2017-03-07 (×2): 200 mL

## 2017-03-07 MED ORDER — ATORVASTATIN CALCIUM 20 MG PO TABS: 20.0000 mg | ORAL_TABLET | Freq: Every day | ORAL | Status: DC

## 2017-03-07 MED ORDER — ALLOPURINOL 300 MG PO TABS
300.0000 mg | ORAL_TABLET | Freq: Every day | ORAL | Status: DC
  Administered 2017-03-07: 300 mg via ORAL
  Filled 2017-03-07 (×2): qty 1

## 2017-03-07 MED ORDER — OXYCODONE HCL 5 MG/5ML PO SOLN
5.0000 mg | ORAL | Status: DC | PRN
Start: 2017-03-07 — End: 2017-03-21
  Administered 2017-03-07 – 2017-03-21 (×8): 5 mg
  Filled 2017-03-07 (×8): qty 5

## 2017-03-07 MED ORDER — IBUPROFEN 100 MG/5ML PO SUSP
600.0000 mg | Freq: Four times a day (QID) | ORAL | Status: DC | PRN
  Administered 2017-03-08 – 2017-03-13 (×7): 600 mg
  Filled 2017-03-07 (×9): qty 30

## 2017-03-07 MED ORDER — SODIUM BICARBONATE 650 MG PO TABS
650.0000 mg | ORAL_TABLET | Freq: Once | ORAL | Status: AC
  Administered 2017-03-07: 650 mg via ORAL
  Filled 2017-03-07: qty 1

## 2017-03-07 MED ORDER — ACETAMINOPHEN 160 MG/5ML PO SOLN
650.0000 mg | Freq: Four times a day (QID) | ORAL | Status: DC | PRN
Start: 2017-03-07 — End: 2017-03-21
  Administered 2017-03-07 – 2017-03-20 (×5): 650 mg
  Filled 2017-03-07 (×6): qty 20.3

## 2017-03-07 MED ORDER — PANCRELIPASE (LIP-PROT-AMYL) 12000-38000 UNITS PO CPEP
24000.0000 [IU] | ORAL_CAPSULE | Freq: Once | ORAL | Status: AC
  Administered 2017-03-07: 24000 [IU] via ORAL
  Filled 2017-03-07: qty 2

## 2017-03-07 NOTE — Progress Notes (Signed)
   03/07/17 1100  Clinical Encounter Type  Visited With Patient;Patient and family together  Visit Type Follow-up  Referral From Nurse  Consult/Referral To Chaplain  Spiritual Encounters  Spiritual Needs Emotional  Stress Factors  Patient Stress Factors Exhausted  Family Stress Factors Exhausted    Pt still intubated. Male friend on-site eating, and says no to disturbance. Chaplain will revisit later when family is willing.   Boss Danielsen a Medical sales representative, [employer]

## 2017-03-07 NOTE — Progress Notes (Signed)
[facility] TEAM 1 - Stepdown/ICU TEAM  [name]  KDX:[id number] DOB: 1955-03-14 DOA: 02/16/2017 PCP: [name], MD    Brief Narrative:  62y/o male with a history of lymphoma in remission (last treated in 2012) who was admitted with ARDS due to influenza, and found to have new severe systolic congestive heart failure with an EF of 25%.    Significant Events: 2/7 admit  2/8 TTE - EF 25-30% - diffuse hypokinesis - mild mitral regurg 2/9 intubated  2/14 tracheostomy  2/24 trach changed to #6 distal XLT due to cuff leak   Subjective: The pt is resting comfortably in bed.  He continues to have some issues w/ anxiety, particularly at night, which are preventing him from sleeping consistently.  He also c/o pain in his ankles/feet c/w his chronic arthritis.  He denise cp, n/v, or abdom pain.    Assessment & Plan:  ARDS due to Influenza A   s/p trach - trach and vent care per PCCM - weaning at present on PS trial   Acute pulmonary edema due to Acute systolic CHF (EF 97%) Cardiology following - unclear duration/new diagnosis - diuretic and coronary eval as per Cards - net negative ~9400 since admit - cont BB, lasix, ARB  New onset persistent Atrial fibrillation   On eliquis - rate controlled - BP stable   Hypomagnesemia Corrected   Hypokalemia Corrected - monitor w/ ongoing use of KCl  History of Non-Hodgkins lymphoma Has porta-cath in L chest   Bilateral foot wounds Wound care  DM2   CBG currently well controlled   Dermatitis on back Likely tinea corporis, or "heat rash" - trial of antifungal topically - consult WOC RN  Deconditioning will need extensive rehab (LTAC vs CIR) - his insurance company has denied him access to an LTACH   Morbid obesity - Body mass index is 44.28 kg/m.   DVT prophylaxis: Eliquis  Code Status: FULL CODE Family Communication: no family present at time of exam today  Disposition Plan: appealing LTACH placement (insurance denied)    Consultants:  PCCM Cardiology   Antimicrobials:  Levaquin 2/3 >2/5 + 2/7  Vancomycin 2/7 > 2/12 Azactam 2/7 >2/12  Objective: Blood pressure 111/82, pulse 79, temperature 99.2 F (37.3 C), temperature source Oral, resp. rate (!) 22, height 5' 11"  (1.803 m), weight (!) 144 kg (317 lb 7.4 oz), SpO2 95 %.  Intake/Output Summary (Last 24 hours) at 03/07/2017 0850 Last data filed at 03/07/2017 0600 Gross per 24 hour  Intake 1510 ml  Output 2700 ml  Net -1190 ml   Filed Weights   03/03/17 0500 03/04/17 0455 03/07/17 0500  Weight: (!) 156.8 kg (345 lb 10.9 oz) (!) 144 kg (317 lb 7.4 oz) (!) 144 kg (317 lb 7.4 oz)    Examination: General: No acute respiratory distress - alert and communicative  Lungs: Clear to auscultation bilaterally - poor air movement B bases w/o change   Cardiovascular: irreg irreg - no M or rub - rate still controlled  Abdomen: Nontender, morbidly obese, soft, bowel sounds positive, no rebound Extremities: 1+ B LE edema w/o change Cutaneous:  Non-raised erythematous region of middle back skin w/o overlying wound/skin break  CBC: Recent Labs  Lab 03/03/17 0508 03/04/17 0442 03/05/17 0540 03/06/17 0606 03/07/17 0436  WBC 11.2* 10.9* 12.2* 11.2* 9.9  HGB 13.6 13.1 13.2 11.7* 11.6*  HCT 42.4 41.1 41.2 37.4* 36.3*  MCV 93.4 93.8 93.2 94.2 93.6  PLT 194 201 242 216 223  Basic Metabolic Panel: Recent Labs  Lab 03/03/17 0508 03/04/17 0442 03/04/17 2045 03/05/17 0540 03/06/17 0630 03/07/17 0436  NA 146* 142  --  139 138 137  K 3.2* 3.3* 4.0 3.7 3.4* 3.9  CL 104 100*  --  100* 100* 100*  CO2 31 31  --  29 29 28   GLUCOSE 115* 119*  --  111* 123* 114*  BUN 41* 30*  --  31* 30* 30*  CREATININE 0.64 0.55*  --  0.55* 0.57* 0.57*  CALCIUM 8.5* 8.3*  --  8.2* 8.0* 8.0*  MG 1.9 1.7  --  1.9 1.7 1.9   GFR: Estimated Creatinine Clearance: 141 mL/min (A) (by C-G formula based on SCr of 0.57 mg/dL (L)).  Liver Function Tests: Recent Labs  Lab  03/07/17 0436  AST 33  ALT 42  ALKPHOS 98  BILITOT 2.0*  PROT 5.6*  ALBUMIN 1.9*    HbA1C: Hgb A1c MFr Bld  Date/Time Value Ref Range Status  02/16/2017 11:00 PM 6.3 (H) 4.8 - 5.6 % Final    Comment:    (NOTE) Pre diabetes:          5.7%-6.4% Diabetes:              >6.4% Glycemic control for   <7.0% adults with diabetes     CBG: Recent Labs  Lab 03/06/17 1538 03/06/17 2022 03/06/17 2339 03/07/17 0336 03/07/17 0740  GLUCAP 99 120* 129* 103* 98     Scheduled Meds: . amLODipine  5 mg Per Tube Daily  . apixaban  5 mg Per Tube BID  . aspirin  81 mg Per Tube Daily  . atorvastatin  20 mg Per Tube q1800  . carvedilol  25 mg Per Tube BID WC  . chlorhexidine gluconate (MEDLINE KIT)  15 mL Mouth Rinse BID  . Chlorhexidine Gluconate Cloth  6 each Topical Daily  . feeding supplement (PRO-STAT SUGAR FREE 64)  60 mL Per Tube TID  . free water  300 mL Per Tube Q6H  . furosemide  40 mg Per Tube BID  . insulin aspart  0-15 Units Subcutaneous Q4H  . ipratropium-albuterol  3 mL Nebulization TID  . losartan  50 mg Per Tube Daily  . mouth rinse  15 mL Mouth Rinse QID  . Melatonin  6 mg Oral QHS  . mupirocin cream   Topical Daily  . pantoprazole sodium  40 mg Per Tube Q1200  . potassium chloride  40 mEq Per Tube BID  . sodium chloride flush  10-40 mL Intracatheter Q12H     LOS: 19 days   Cherene Altes, MD [facility] Office  [phone] Pager - Text Page per Amion as per below:  On-Call/Text Page:      [email or web address]      password [email or web address]  If 7PM-7AM, please contact night-coverage [email or web address] Password [facility] 03/07/2017, 8:50 AM

## 2017-03-07 NOTE — Progress Notes (Signed)
Rehab Admissions Coordinator Note:  Patient was screened by Cleatrice Burke for appropriateness for an Inpatient Acute Rehab Consult per SLP recommendation. PT and OT recommend LTACH. Pt not at a level to be able to tolerate the intensity of an inpt rehab at this time. I reviewed OT treatment today.  Cleatrice Burke 03/07/2017, 2:15 PM  I can be reached at [phone].

## 2017-03-07 NOTE — Progress Notes (Signed)
Upon administering scheduled 2200 medication, the cortrak clogged. Did multiple interventions to attempt to unclog cortrak; they were unsuccessfully. Tube feeds will not infuse. Per tube medication can not be administered. Will continue to check CBG and monitor.

## 2017-03-07 NOTE — Progress Notes (Signed)
  Speech Language Pathology Treatment: Nada Boozer Speaking valve  Patient Details Name: MALAKHAI BEITLER MRN: [id number] DOB: [birth year] Today's Date: 03/07/2017 Time: 1230-1300 SLP Time Calculation (min) (ACUTE ONLY): 30 min  Assessment / Plan / Recommendation Clinical Impression  Skilled treatment session focused on PMSV toleration. Pt currently tolerating trach collar. SLP received pt with cuff inflated and O2 was 90% and all other vitals within normal limits. Pt alert and interactive. RT present to suction pt as SLP deflated cuff. Pt able to expel secretions through trach with O2 stats dropping and fluctuating between 80 and 85%. After secretions cleared, SLP applied rounds of finger occlusion and pt able to obtain phonation at the 2 to 3 word level that was intelligible. However, pt's stats never regain to 90%. With Mod A verbal and visual cues, pt able to engage in deliberate diaphragmic breathing but despite this pt unable to increase stats. SLP inflated cuff and pt able to regain and maintain at 90%. ST to follow for cuff deflation trials and toleration of PMSV. Nurse and wife present with education provided.    HPI HPI: 62y/o male with a history of lymphoma in remission(last treated in 2012)who was admitted with ARDS due to influenza, andfound to have new severe systolic congestive heart failure with an EF of 25%.Pt was intubated on 2/9, trach placed at bedside on 2/14. #6 distal XLT placed on 2/24 due to cuff leak. Vent setting on 2/25: CPAP/PS FiO2 40%; PEEP 8; Pt has Cortrak.       SLP Plan  Continue with current plan of care       Recommendations  Diet recommendations: NPO      Patient may use Passy-Muir Speech Valve: with SLP only PMSV Supervision: Full MD: Please consider changing trach tube to : Cuffless;Smaller size         Oral Care Recommendations: Oral care QID Follow up Recommendations: Inpatient Rehab SLP Visit Diagnosis: Aphonia (R49.1) Plan: Continue with  current plan of care       GO                Keiara Sneeringer 03/07/2017, 2:05 PM

## 2017-03-07 NOTE — Progress Notes (Signed)
Occupational Therapy Treatment Patient Details Name: [name] MRN: [id number] DOB: 12/24/1955 Today's Date: 03/07/2017    History of present illness Pt is a 62 y/o male history of lymphoma in remission admitted with acute on chronic hypoxemic and hypercapnic respiratory failure due to influenza A.  He was also noted to have a new LVEF of 25% with contribution of cardiogenic edema.  He failed extubation on 2/14.  Based on his need for continued ventilatory support he underwent percutaneous tracheostomy on 2/14.   OT comments  Pt seen to progress with ability to tolerate upright positioning in preparation for OOB mobility during ADL participation. Limited ability to progress today secondary to increase in pain and pt feeling as though he was unable breathe when in flat supine while bed preparing for tilt (SpO2 93% and HR in the 90s). Pt's significant other present during the session. Pt mouthing words to therapist and following commands well today. Educated concerning increase in functional use of B UE during ADL as pt is able. Provided improved positioning of B LE as the footboard of the bed has been uncomfortable for pt overnight. Will continue to follow while admitted. Plan to increase tilting schedule with nursing once pt better able to tolerate.    Follow Up Recommendations  LTACH    Equipment Recommendations  Other (comment)(TBD at next venue of care)    Recommendations for Other Services      Precautions / Restrictions Precautions Precautions: Fall Precaution Comments: trach, vent, NG Restrictions Weight Bearing Restrictions: No       Mobility Bed Mobility               General bed mobility comments: NT due to Vital Go bed.  Total assist to scoot up in bed.  Transfers                 General transfer comment: Unable to tolerate flat supine posistioning for amount of time required to position bed for tilt.     Balance                                            ADL either performed or assessed with clinical judgement   ADL Overall ADL's : Needs assistance/impaired Eating/Feeding: NPO                                     General ADL Comments: Pt able to complete hand to mouth movements today.  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 Attempted to position pt in preparation for use of Vital Go tilt for progression to standing. However, when laid flat in preparation to tilt, pt and significant other requesting to sit head up due to pt feeling that he was unable to breathe (SpO2 93%). At this point, bed controls were not responding. We were able to adjust and regain control of bed controls. However, pt with significant overall pain at this point and deferred further tilting. Positioned B LE to prevent pressing against feet board which has been causing him pain.      Vision       Perception     Praxis      Cognition Arousal/Alertness: Awake/alert Behavior During Therapy: WFL for tasks assessed/performed Overall Cognitive Status:  Within Functional Limits for tasks assessed                                 General Comments: Mouths words, follows commands        Exercises Other Exercises Other Exercises: Educated on need to incorporate BUE during all functional tasks.    Shoulder Instructions       General Comments Trach collar 10 L 40% with stable vitals 91-94%.    Pertinent Vitals/ Pain       Pain Assessment: No/denies pain  Home Living                                          Prior Functioning/Environment              Frequency  Min 2X/week        Progress Toward Goals  OT Goals(current goals can now be found in the care plan section)  Progress towards OT goals: Progressing toward goals  Acute Rehab OT Goals Patient Stated Goal: wanting to work on anything that will make him stronger OT Goal  Formulation: With patient Time For Goal Achievement: 04/14/17 Potential to Achieve Goals: Good  Plan Discharge plan remains appropriate    Co-evaluation                 AM-PAC PT "6 Clicks" Daily Activity     Outcome Measure   Help from another person eating meals?: Total Help from another person taking care of personal grooming?: A Lot Help from another person toileting, which includes using toliet, bedpan, or urinal?: Total Help from another person bathing (including washing, rinsing, drying)?: Total Help from another person to put on and taking off regular upper body clothing?: Total Help from another person to put on and taking off regular lower body clothing?: Total 6 Click Score: 7    End of Session    OT Visit Diagnosis: Other abnormalities of gait and mobility (R26.89);Muscle weakness (generalized) (M62.81)   Activity Tolerance Patient limited by pain   Patient Left in bed;with call bell/phone within reach;with family/visitor present   Nurse Communication Mobility status;Other (comment)(once bed functioning properly - needs 3 tilts/day)        Time: 2947-6546 OT Time Calculation (min): 45 min  Charges: OT General Charges $OT Visit: 1 Visit OT Treatments $Therapeutic Activity: 38-52 mins  Norman Herrlich, MS OTR/L  Pager: [place] 03/07/2017, 1:22 PM

## 2017-03-07 NOTE — Consult Note (Addendum)
[facility] Nurse wound re-consult note Reason for Consult: Requested to assess back for rash.  WOC consulted previously on 2/18 and 2/8; pt previously was noted to have moisture associated skin damage (MASD) and candidiasis to back and buttocks with red macular papular rash and moist peeling skin and patchy areas of partial thickness skin loss at that time. Wound type: Skin appearance has changed; previous MASD and candidiasis has resolved, and pt now has generalized induration and erythremia to right posterior chest/flank; affected area is approx 20X15cm.  Appearance is NOT consistent with a rash; but suspect this is related to cellulitis. Topical treatment will not be effective to promote healing.  Discussed plan of care with primary team; pt might benefit from ultrasound or systemic antibiotic coverage; please order if desired. Please re-consult if further assistance is needed.  Thank-you,  Julien Girt MSN, [place], [place], [place], [place]

## 2017-03-07 NOTE — Care Management Note (Signed)
  Case Management Note  Patient Details  Name: [name] MRN: [id number] Date of Birth: 09-21-1955  Subjective/Objective:   Pt admitted with flu and pulm edema            Action/Plan:   PTA independent from home alone.  Pt currently intubated.    Expected Discharge Date:                  Expected Discharge Plan:  Home/Self Care  In-House Referral:     Discharge planning Services  CM Consult  Post Acute Care Choice:    Choice offered to:     DME Arranged:    DME Agency:     HH Arranged:    HH Agency:     Status of Service:     If discussed at [employer] of Stay Meetings, dates discussed:    Additional Comments: 03/07/2017  LTACH denied by insurance with peer to peer completion.  CM informed both pt and SO - informed them both that if pt can not wean from ventilator discharge plan will likely include SNF placement out of state.  CSW aware of denial for [facility] and SNF placement need.  Pt is currently tolerating TC.   CM will continue to follow for discharge needs  03/02/17 [facility] referral given by attending - physician advisor in agreement.  Pt is alert and oriented - pt given choice of [facility] - both agencies met with pt at bedside.  Pt states his significant other has been in contact with pts sister - pt declined for CM to reach out to sister.  Pt chose [facility] - agency starting insurance [place] is aware of pts choice   02/24/2017 Pt self extubated and was immediately re intubated/  Pt is now 1 day s/p trach - still requiring vent.  Pt has increased WOB with agonal breaths, plan is to increase sedation and supplemental oxygen Maryclare Labrador, RN 03/07/2017, 2:51 PM

## 2017-03-08 DIAGNOSIS — R0902 Hypoxemia: Secondary | ICD-10-CM

## 2017-03-08 LAB — GLUCOSE, CAPILLARY
GLUCOSE-CAPILLARY: 90 mg/dL (ref 65–99)
GLUCOSE-CAPILLARY: 113 mg/dL — AB (ref 65–99)
GLUCOSE-CAPILLARY: 129 mg/dL — AB (ref 65–99)
Glucose-Capillary: 81 mg/dL (ref 65–99)
Glucose-Capillary: 113 mg/dL — ABNORMAL HIGH (ref 65–99)
Glucose-Capillary: 109 mg/dL — ABNORMAL HIGH (ref 65–99)

## 2017-03-08 LAB — BASIC METABOLIC PANEL
ANION GAP: 8 (ref 5–15)
BUN: 22 mg/dL — ABNORMAL HIGH (ref 6–20)
CALCIUM: 8 mg/dL — AB (ref 8.9–10.3)
CHLORIDE: 100 mmol/L — AB (ref 101–111)
CO2: 27 mmol/L (ref 22–32)
CREATININE: 0.58 mg/dL — AB (ref 0.61–1.24)
GFR calc non Af Amer: >60 mL/min (ref >60)
Glucose, Bld: 100 mg/dL — ABNORMAL HIGH (ref 65–99)
Potassium: 4 mmol/L (ref 3.5–5.1)
Sodium: 135 mmol/L (ref 135–145)

## 2017-03-08 MED ORDER — BUTALBITAL-APAP-CAFFEINE 50-325-40 MG PO TABS
1.0000 | ORAL_TABLET | Freq: Four times a day (QID) | ORAL | Status: DC | PRN
  Administered 2017-03-08: 1
  Filled 2017-03-08: qty 2
  Filled 2017-03-08: qty 1

## 2017-03-08 MED ORDER — ALLOPURINOL 300 MG PO TABS
300.0000 mg | ORAL_TABLET | Freq: Every day | ORAL | Status: DC
  Administered 2017-03-08 – 2017-03-21 (×14): 300 mg
  Filled 2017-03-08 (×14): qty 1

## 2017-03-08 MED ORDER — MELATONIN 3 MG PO TABS
6.0000 mg | ORAL_TABLET | Freq: Every day | ORAL | Status: DC
  Administered 2017-03-08 – 2017-03-20 (×13): 6 mg
  Filled 2017-03-08 (×14): qty 2

## 2017-03-08 MED ORDER — FEBUXOSTAT 40 MG PO TABS
40.0000 mg | ORAL_TABLET | Freq: Every day | ORAL | Status: DC
  Administered 2017-03-08 – 2017-03-21 (×14): 40 mg
  Filled 2017-03-08 (×14): qty 1

## 2017-03-08 MED ORDER — CLINDAMYCIN HCL 300 MG PO CAPS
300.0000 mg | ORAL_CAPSULE | Freq: Four times a day (QID) | ORAL | Status: AC
  Administered 2017-03-08 – 2017-03-12 (×17): 300 mg
  Filled 2017-03-08 (×18): qty 1

## 2017-03-08 MED ORDER — FUROSEMIDE 10 MG/ML IJ SOLN
60.0000 mg | Freq: Two times a day (BID) | INTRAMUSCULAR | Status: DC
  Administered 2017-03-08 – 2017-03-10 (×6): 60 mg via INTRAVENOUS
  Filled 2017-03-08 (×9): qty 6

## 2017-03-08 MED ORDER — SODIUM CHLORIDE 0.9% FLUSH
10.0000 mL | Freq: Two times a day (BID) | INTRAVENOUS | Status: DC
  Administered 2017-03-09: 10 mL

## 2017-03-08 MED ORDER — SODIUM CHLORIDE 0.9% FLUSH: 10.0000 mL | INTRAVENOUS | Status: DC | PRN

## 2017-03-08 MED ORDER — MORPHINE SULFATE (PF) 4 MG/ML IV SOLN
4.0000 mg | Freq: Once | INTRAVENOUS | Status: AC
  Administered 2017-03-08: 4 mg via INTRAVENOUS
  Filled 2017-03-08: qty 1

## 2017-03-08 MED ORDER — SERTRALINE HCL 20 MG/ML PO CONC
50.0000 mg | Freq: Every day | ORAL | Status: DC
  Administered 2017-03-08 – 2017-03-17 (×10): 50 mg
  Filled 2017-03-08 (×12): qty 2.5

## 2017-03-08 MED ORDER — CHLORHEXIDINE GLUCONATE CLOTH 2 % EX PADS
6.0000 | MEDICATED_PAD | Freq: Every day | CUTANEOUS | Status: DC
  Administered 2017-03-08 – 2017-03-21 (×13): 6 via TOPICAL

## 2017-03-08 MED ORDER — MORPHINE SULFATE (PF) 2 MG/ML IV SOLN: 2.0000 mg | INTRAVENOUS | Status: DC | PRN

## 2017-03-08 NOTE — NC FL2 (Signed)
[place] MEDICAID FL2 LEVEL OF CARE SCREENING TOOL     IDENTIFICATION  Patient Name: [name] Birthdate: [birth year] Sex: male Admission Date (Current Location): 02/16/2017  [facility] and [place] Number:  Herbalist and Address:  [facility] [place], [place] [address] [place], [place] [address]      Provider Number: [id number]  Attending Physician Name and Address:  Cherene Altes, MD  Relative Name and Phone Number:       Current Level of Care: Hospital Recommended Level of Care: Other (Comment)(LTACH or Vent/snf depending upon vent needs. ) Prior Approval Number:    Date Approved/Denied:   PASRR Number:   [id number]  Discharge Plan: Other (Comment)(LTACH or vent/snf.)    Current Diagnoses: Patient Active Problem List   Diagnosis Date Noted  . Pressure injury of skin 02/28/2017  . Acute respiratory failure with hypoxemia ([place])   . Status post tracheostomy ([place])   . Acute respiratory failure ([place])   . Respiratory failure, unspecified with hypoxia ([place]) 02/16/2017  . Follicular lymphoma grade II of intrathoracic lymph nodes ([place]) 05/02/2016  . Portacath in place 05/04/2015  . Gout attack 01/16/2012  . Follicular lymphoma grade II ([place]) 12/16/2010    Orientation RESPIRATION BLADDER Height & Weight     (intubated at this time. )  Vent(40 perecnt.) Continent Weight: (!) 316 lb 5.8 oz (143.5 kg) Height:  '5\' 11"'$  (180.3 cm)  BEHAVIORAL SYMPTOMS/MOOD NEUROLOGICAL BOWEL NUTRITION STATUS      Incontinent Diet(please see discharge summary. )  AMBULATORY STATUS COMMUNICATION OF NEEDS Skin   Extensive Assist   Surgical wounds(trach site. )                       Personal Care Assistance Level of Assistance  Bathing, Feeding, Dressing Bathing Assistance: Maximum assistance Feeding assistance: Maximum assistance Dressing Assistance: Maximum assistance     Functional Limitations Info  Sight, Hearing, Speech Sight Info: Adequate Hearing  Info: Adequate Speech Info: Impaired(pt has a trach that was placed on 02/23/17.)    SPECIAL CARE FACTORS FREQUENCY  PT (By licensed PT), OT (By licensed OT), Speech therapy     PT Frequency: 5 times a week  OT Frequency: 5 times a week      Speech Therapy Frequency: 5 times a week       Contractures Contractures Info: Not present    Additional Factors Info  Code Status, Allergies Code Status Info: Full Allergies Info: Penicillins           Current Medications (03/08/2017):  This is the current hospital active medication list Current Facility-Administered Medications  Medication Dose Route Frequency Provider Last Rate Last Dose  . acetaminophen (TYLENOL) solution 650 mg  650 mg Per Tube Q6H PRN Yopp, Amber C, RPH   650 mg at 03/07/17 1413  . allopurinol (ZYLOPRIM) tablet 300 mg  300 mg Oral Daily Cherene Altes, MD   300 mg at 03/07/17 1008  . amLODipine (NORVASC) tablet 5 mg  5 mg Per Tube Daily Simonne Maffucci B, MD   5 mg at 03/07/17 1008  . apixaban (ELIQUIS) tablet 5 mg  5 mg Per Tube BID Simonne Maffucci B, MD   5 mg at 03/07/17 2127  . aspirin chewable tablet 81 mg  81 mg Per Tube Daily Erick Colace, NP   81 mg at 03/07/17 1021  . atorvastatin (LIPITOR) tablet 20 mg  20 mg Per Tube q1800 Erick Colace,  NP   20 mg at 03/07/17 1738  . butalbital-acetaminophen-caffeine (FIORICET, ESGIC) 50-325-40 MG per tablet 1-2 tablet  1-2 tablet Oral Q6H PRN Cherene Altes, MD   1 tablet at 03/07/17 0309  . carvedilol (COREG) tablet 25 mg  25 mg Per Tube BID WC Kathyrn Drown D, NP   25 mg at 03/07/17 1737  . chlorhexidine gluconate (MEDLINE KIT) (PERIDEX) 0.12 % solution 15 mL  15 mL Mouth Rinse BID Rush Farmer, MD   15 mL at 03/07/17 0832  . Chlorhexidine Gluconate Cloth 2 % PADS 6 each  6 each Topical Daily Juanito Doom, MD   6 each at 03/07/17 1600  . clindamycin (CLEOCIN) capsule 300 mg  300 mg Oral Q6H Cherene Altes, MD   300 mg at 03/07/17 1739  .  febuxostat (ULORIC) tablet 40 mg  40 mg Oral Daily Cherene Altes, MD   40 mg at 03/07/17 1008  . feeding supplement (PRO-STAT SUGAR FREE 64) liquid 60 mL  60 mL Per Tube TID Collene Gobble, MD   60 mL at 03/07/17 2127  . feeding supplement (VITAL HIGH PROTEIN) liquid 1,000 mL  1,000 mL Per Tube Continuous Collene Gobble, MD   Stopped at 03/07/17 2100  . free water 200 mL  200 mL Per Tube Q6H Cherene Altes, MD   200 mL at 03/07/17 1819  . furosemide (LASIX) tablet 40 mg  40 mg Per Tube BID Kathyrn Drown D, NP   40 mg at 03/07/17 1738  . hydrALAZINE (APRESOLINE) injection 10-40 mg  10-40 mg Intravenous Q4H PRN Rigoberto Noel, MD      . ibuprofen (ADVIL,MOTRIN) 100 MG/5ML suspension 600 mg  600 mg Per Tube Q6H PRN Cherene Altes, MD      . insulin aspart (novoLOG) injection 0-15 Units  0-15 Units Subcutaneous Q4H Collene Gobble, MD   2 Units at 03/07/17 1607  . ipratropium-albuterol (DUONEB) 0.5-2.5 (3) MG/3ML nebulizer solution 3 mL  3 mL Nebulization Q4H PRN Jennelle Human B, NP      . ipratropium-albuterol (DUONEB) 0.5-2.5 (3) MG/3ML nebulizer solution 3 mL  3 mL Nebulization TID Kloefkorn, [place], MD   3 mL at 03/07/17 2012  . LORazepam (ATIVAN) 2 MG/ML concentrated solution 0.5-1 mg  0.5-1 mg Per Tube Q4H PRN Cherene Altes, MD      . losartan (COZAAR) tablet 50 mg  50 mg Per Tube Daily Minus Breeding, MD   50 mg at 03/07/17 1008  . MEDLINE mouth rinse  15 mL Mouth Rinse QID Rush Farmer, MD   15 mL at 03/08/17 0452  . Melatonin TABS 6 mg  6 mg Oral QHS Simonne Maffucci B, MD   6 mg at 03/07/17 2127  . morphine 2 MG/ML injection 2 mg  2 mg Intravenous Q3H PRN Schorr, Rhetta Mura, NP      . mupirocin cream (BACTROBAN) 2 %   Topical Daily Hammonds, Sharyn Blitz, MD      . ondansetron [facility]) injection 4 mg  4 mg Intravenous Q6H PRN Mauri Brooklyn, MD   4 mg at 02/18/17 3016  . oxyCODONE (ROXICODONE) 5 MG/5ML solution 5-7.5 mg  5-7.5 mg Per Tube Q3H PRN Cherene Altes, MD   5  mg at 03/07/17 1859  . pantoprazole sodium (PROTONIX) 40 mg/20 mL oral suspension 40 mg  40 mg Per Tube Q1200 Erick Colace, NP   40 mg at 03/07/17 1236  . sodium  chloride flush (NS) 0.9 % injection 10-40 mL  10-40 mL Intracatheter PRN Sherwood Gambler, MD      . sodium chloride flush (NS) 0.9 % injection 10-40 mL  10-40 mL Intracatheter Q12H Juanito Doom, MD   10 mL at 03/07/17 2206  . sodium chloride flush (NS) 0.9 % injection 10-40 mL  10-40 mL Intracatheter PRN Juanito Doom, MD       Facility-Administered Medications Ordered in Other Encounters  Medication Dose Route Frequency Provider Last Rate Last Dose  . sodium chloride 0.9 % injection 10 mL  10 mL Intravenous PRN Curt Bears, MD   10 mL at 05/04/15 1529     Discharge Medications: Please see discharge summary for a list of discharge medications.  Relevant Imaging Results:  Relevant Lab Results:   Additional Information [id number]. Pt had trach placed on 02/23/17. Shiley 64m cuffed.   KWetzel Bjornstad LCSWA

## 2017-03-08 NOTE — Progress Notes (Signed)
Cortrak Tube Team Note:  Consult received to re-place a Cortrak feeding tube. Per reports pt coughed out feeding tube during SLP session. Bridle was not in place at time of RD visit. Both were replaced.   A 10 F Cortrak tube was re-placed in the right nare and secured with a nasal bridle at 87 cm. Per the Cortrak monitor reading the tube tip is gastric/pylorus region.   No x-ray is required. RN may begin using tube.   If the tube becomes dislodged please keep the tube and contact the Cortrak team at [email or web address] (password [email or web address]) for replacement.  If after hours and replacement cannot be delayed, place a NG tube and confirm placement with an abdominal x-ray.    Mariana Single RD, LDN Clinical Nutrition Pager # [phone]

## 2017-03-08 NOTE — Progress Notes (Signed)
Pt cortrak clogged despite multiple interventions. Pt now complaining of pain. MD paged and made aware that per tube meds are unable to be given. Orders for pain meds IV placed. Orders for cortrak team to evaluate cortrak.

## 2017-03-08 NOTE — Progress Notes (Signed)
Cortrak Tube Team Note:  Consult received to re-place a Cortrak feeding tube.   A 10 F Cortrak tube was re-placed in the right nare and secured with a nasal bridle at 94 cm. Per the Cortrak monitor reading the tube tip is post pyloric.   No x-ray is required. RN may begin using tube.   If the tube becomes dislodged please keep the tube and contact the Cortrak team at [email or web address] (password [email or web address]) for replacement.  If after hours and replacement cannot be delayed, place a NG tube and confirm placement with an abdominal x-ray.    Mariana Single RD, LDN Clinical Nutrition Pager # [phone]

## 2017-03-08 NOTE — Progress Notes (Signed)
[facility] TEAM 1 - Stepdown/ICU TEAM  [name]  BTY:[id number] DOB: [birth year] DOA: 02/16/2017 PCP: [name], MD    Brief Narrative:  62y/o male with a history of lymphoma in remission (last treated in 2012) who was admitted with ARDS due to influenza, and found to have new severe systolic congestive heart failure with an EF of 25%.    Significant Events: 2/7 admit  2/8 TTE - EF 25-30% - diffuse hypokinesis - mild mitral regurg 2/9 intubated  2/14 tracheostomy  2/24 trach changed to #6 distal XLT due to cuff leak   Subjective: Pt has been refusing to work w/ PT/OT.  He has been lacking in motivation to participate in his care.  He reports he feels anxious about dyspnea w/ exertion.  He feels very tired.  He has been doing well on his vent wean, and remained on TC all night last night.    Assessment & Plan:  ARDS due to Influenza A   s/p trach - trach and vent care per PCCM - weaning at present w/ no vent use all night last night   Acute pulmonary edema due to Acute systolic CHF (EF 77%) Cardiology following - unclear duration/new diagnosis - ?viral CM - coronary eval as per Cards - net negative ~7000 since admit - cont BB, ARB - increase lasix dose in attempt to maximize pulmonary fxn   New onset persistent Atrial fibrillation   On eliquis - rate controlled - BP stable   Dysphagia  Cont to work w/ SLP - cont Cortrak for now (needs to be replaced today as is clogged)  Hypomagnesemia Corrected   Hypokalemia Corrected - monitor w/ ongoing use of diuretic   History of Non-Hodgkins lymphoma Has porta-cath in L chest   Bilateral foot wounds Wound care  DM2   CBG currently well controlled   Dermatitis on back v/s cellulitis  Re-eval of rash and discussion w/ WOC convinced me this may be a local cellulitis - cellulitis protocol initiated, w/ use of clindamycin due to PCN allergy - area appears stable on re-eval today   Deconditioning will need extensive rehab  (ideally CIR), but motivation is presently an issue - I have spent a lengthy amount of time at the bedside this morning explaining to him in no uncertain terms that he MUST decide to participate w/ therapy and the consequences of failing to do so - I will begin tx for situational anxiety and depression w/ low dose zoloft   Morbid obesity - Body mass index is 44.12 kg/m.   DVT prophylaxis: Eliquis  Code Status: FULL CODE Family Communication: no family present at time of exam today  Disposition Plan: weaning off vent w/ hope for full vent liberation w/in 24hrs - hopeful for CIR but will require signif change in participation from him - if not, will need SNF for rehab, though will likely refuse   Consultants:  PCCM Cardiology   Antimicrobials:  Levaquin 2/3 >2/5 + 2/7  Vancomycin 2/7 > 2/12 Azactam 2/7 >2/12 Clindamycin 2/26 >  Objective: Blood pressure 131/85, pulse 94, temperature 98.4 F (36.9 C), temperature source Oral, resp. rate 17, height _0  (1.803 m), weight (!) 143.5 kg (316 lb 5.8 oz), SpO2 92 %.  Intake/Output Summary (Last 24 hours) at 03/08/2017 0914 Last data filed at 03/08/2017 4142 Gross per 24 hour  Intake 1160 ml  Output 1700 ml  Net -540 ml   Filed Weights   03/04/17 0455 03/07/17 0500 03/08/17 0500  Weight: (!) 144 kg (317 lb 7.4 oz) (!) 144 kg (317 lb 7.4 oz) (!) 143.5 kg (316 lb 5.8 oz)    Examination: General: No acute respiratory distress - alert - withdrawn Lungs: Clear to auscultation bilaterally - poor air movement bases Cardiovascular: irreg irreg w controlled rate  Abdomen: NT/ND, morbidly obese, soft, bowel sounds positive, no rebound Extremities: 2+ B LE edema w/o change Cutaneous:  Non-raised erythematous region of middle back skin w/o overlying wound/skin break w/o worsening   CBC: Recent Labs  Lab 03/03/17 0508 03/04/17 0442 03/05/17 0540 03/06/17 0606 03/07/17 0436  WBC 11.2* 10.9* 12.2* 11.2* 9.9  HGB 13.6 13.1 13.2 11.7*  11.6*  HCT 42.4 41.1 41.2 37.4* 36.3*  MCV 93.4 93.8 93.2 94.2 93.6  PLT 194 201 242 216 335   Basic Metabolic Panel: Recent Labs  Lab 03/03/17 0508 03/04/17 0442 03/04/17 2045 03/05/17 0540 03/06/17 0630 03/07/17 0436 03/08/17 0459  NA 146* 142  --  139 138 137 135  K 3.2* 3.3* 4.0 3.7 3.4* 3.9 4.0  CL 104 100*  --  100* 100* 100* 100*  CO2 31 31  --  _0 GLUCOSE 115* 119*  --  111* 123* 114* 100*  BUN 41* 30*  --  31* 30* 30* 22*  CREATININE 0.64 0.55*  --  0.55* 0.57* 0.57* 0.58*  CALCIUM 8.5* 8.3*  --  8.2* 8.0* 8.0* 8.0*  MG 1.9 1.7  --  1.9 1.7 1.9  --    GFR: Estimated Creatinine Clearance: 140.7 mL/min (A) (by C-G formula based on SCr of 0.58 mg/dL (L)).  Liver Function Tests: Recent Labs  Lab 03/07/17 0436  AST 33  ALT 42  ALKPHOS 98  BILITOT 2.0*  PROT 5.6*  ALBUMIN 1.9*    HbA1C: Hgb A1c MFr Bld  Date/Time Value Ref Range Status  02/16/2017 11:00 PM 6.3 (H) 4.8 - 5.6 % Final    Comment:    (NOTE) Pre diabetes:          5.7%-6.4% Diabetes:              >6.4% Glycemic control for   <7.0% adults with diabetes     CBG: Recent Labs  Lab 03/07/17 1547 03/07/17 1956 03/07/17 2357 03/08/17 0445 03/08/17 0758  GLUCAP 129* 117* 104* 90 81     Scheduled Meds: . allopurinol  300 mg Oral Daily  . amLODipine  5 mg Per Tube Daily  . apixaban  5 mg Per Tube BID  . aspirin  81 mg Per Tube Daily  . atorvastatin  20 mg Per Tube q1800  . carvedilol  25 mg Per Tube BID WC  . chlorhexidine gluconate (MEDLINE KIT)  15 mL Mouth Rinse BID  . Chlorhexidine Gluconate Cloth  6 each Topical Daily  . clindamycin  300 mg Oral Q6H  . febuxostat  40 mg Oral Daily  . feeding supplement (PRO-STAT SUGAR FREE 64)  60 mL Per Tube TID  . free water  200 mL Per Tube Q6H  . furosemide  40 mg Per Tube BID  . insulin aspart  0-15 Units Subcutaneous Q4H  . ipratropium-albuterol  3 mL Nebulization TID  . losartan  50 mg Per Tube Daily  . mouth rinse  15 mL  Mouth Rinse QID  . Melatonin  6 mg Oral QHS  . mupirocin cream   Topical Daily  . pantoprazole sodium  40 mg Per Tube Q1200  . sodium chloride flush  10-40 mL Intracatheter Q12H     LOS: 20 days   Cherene Altes, MD [facility] Office  [phone] Pager - Text Page per Amion as per below:  On-Call/Text Page:      [email or web address]      password [email or web address]  If 7PM-7AM, please contact night-coverage [email or web address] Password [facility] 03/08/2017, 9:14 AM

## 2017-03-08 NOTE — Progress Notes (Signed)
Physical Therapy Treatment Patient Details Name: ROMELLO HOEHN MRN: [id number] DOB: 05/31/1955 Today's Date: 03/08/2017    History of Present Illness Pt is a 62 y/o male history of lymphoma in remission admitted with acute on chronic hypoxemic and hypercapnic respiratory failure due to influenza A.  He was also noted to have a new LVEF of 25% with contribution of cardiogenic edema.  He failed extubation on 2/14.  Based on his need for continued ventilatory support he underwent percutaneous tracheostomy on 2/14.    PT Comments    Pt admitted with above diagnosis. Pt currently with functional limitations due to the deficits listed below (see PT Problem List). Pt was able to tolerate standing in Vital Go tilt bed at 34 degrees for 7 minutes.  VSS.  Limited by gout pain.  Will continue to progress.  Pt will benefit from skilled PT to increase their independence and safety with mobility to allow discharge to the venue listed below.     Follow Up Recommendations  LTACH;Other (comment)(vs SNF based on vent needs)     Equipment Recommendations  None recommended by PT    Recommendations for Other Services       Precautions / Restrictions Precautions Precautions: Fall Precaution Comments: trach, vent, NG Restrictions Weight Bearing Restrictions: No    Mobility  Bed Mobility Overal bed mobility: Needs Assistance Bed Mobility: Rolling;Sidelying to Sit;Sit to Supine Rolling: Max assist;+2 for physical assistance         General bed mobility comments: NT due to Vital Go bed.  Total assist to scoot up in bed.  Transfers Overall transfer level: Needs assistance Equipment used: (Vital Go tilt bed) Transfers: Sit to/from Stand Sit to Stand: +2 physical assistance;Total assist         General transfer comment: Pt progressively inclined in Vital Go tilt bed to 34 degree position for 7 minutes.  Pt performed quad sets/semi squats x 5 while in this position and then stated fatigue and he  wanted to lie back down.  BP initially 129/78 on arrival and then 143/89 in  34 degree, and 151/85 once settled in bed.  O2 sats registering at 88-93% with pt coughing some in standing position however waveform not always stable.  Overall sat 90% and above.  Used suction yaunker  several times during treatment.  Pt was on 35% trach collar.  Ambulation/Gait                 Stairs            Wheelchair Mobility    Modified Rankin (Stroke Patients Only)       Balance Overall balance assessment: Needs assistance                                          Cognition Arousal/Alertness: Awake/alert Behavior During Therapy: WFL for tasks assessed/performed Overall Cognitive Status: Within Functional Limits for tasks assessed                                 General Comments: Mouths words, follows commands      Exercises General Exercises - Lower Extremity Ankle Circles/Pumps: AROM;Both;20 reps;Supine Quad Sets: AROM;Both;10 reps;Supine Mini-Sqauts: AROM;Both;5 reps;Standing    General Comments General comments (skin integrity, edema, etc.): Trach collar 35%      Pertinent Vitals/Pain Pain Assessment:  Faces Faces Pain Scale: Hurts whole lot Pain Location: gout feet, knees and right thumb per pt Pain Descriptors / Indicators: Aching;Grimacing;Guarding Pain Intervention(s): Limited activity within patient's tolerance;Monitored during session;Repositioned    Home Living                      Prior Function            PT Goals (current goals can now be found in the care plan section) Progress towards PT goals: Progressing toward goals    Frequency    Min 2X/week      PT Plan Current plan remains appropriate    Co-evaluation              AM-PAC PT "6 Clicks" Daily Activity  Outcome Measure  Difficulty turning over in bed (including adjusting bedclothes, sheets and blankets)?: Unable Difficulty moving from  lying on back to sitting on the side of the bed? : Unable Difficulty sitting down on and standing up from a chair with arms (e.g., wheelchair, bedside commode, etc,.)?: Unable Help needed moving to and from a bed to chair (including a wheelchair)?: Total Help needed walking in hospital room?: Total Help needed climbing 3-5 steps with a railing? : Total 6 Click Score: 6    End of Session Equipment Utilized During Treatment: (trach collar) Activity Tolerance: Patient limited by fatigue;Patient limited by pain Patient left: in bed;with call bell/phone within reach;with family/visitor present Nurse Communication: Mobility status;Need for lift equipment PT Visit Diagnosis: Other abnormalities of gait and mobility (R26.89)     Time: 1000-1030 PT Time Calculation (min) (ACUTE ONLY): 30 min  Charges:  $Therapeutic Exercise: 8-22 mins $Therapeutic Activity: 8-22 mins                    G Codes:       Cleota Pellerito,PT Acute Rehabilitation [phone] (pager)    Denice Paradise 03/08/2017, 11:45 AM

## 2017-03-08 NOTE — Progress Notes (Signed)
  Speech Language Pathology Treatment: Nada Boozer Speaking valve  Patient Details Name: [name] MRN: [id number] DOB: Feb 28, 1955 Today's Date: 03/08/2017 Time: 7915-0569 SLP Time Calculation (min) (ACUTE ONLY): 25 min  Assessment / Plan / Recommendation Clinical Impression  Skilled treatment session focused on PMSV. SLP deflated cuff while nursing suctioned at trach hub and orally for secretions. Pt with significant secretions but was able to clear via trach and orally. Pt's O2 with cuff inflated was 90% on trach collar and remained at 90% with cuff deflated. PMSV placed with good ability to direct air thru upper airway and obtain phonation. Pt with wet vocal quality and decreased vocal intensity. With Mod A cues, pt with attempts to clear wetness via throat clear and cough but continued to remain mildly wet throughout. With Mod A cues, pt able to increase vocal intensity and achieve ~ 50% intelligibility at the phrase level. Pt with no s/s of air stacking and "enjoyed" having the PMV in place to talk. Friend present and information shared about request to leave cuff deflated, PMSV usage with SLP and will request MD to change to cuffless trach or downsize. SLP removed PMSV and pt able to phonate some around trach. Education provided to nursing, request made to leave trach deflated as pt tolerates. Will attempt PO intake/BSE at next available appointment.     HPI HPI: 62y/o male with a history of lymphoma in remission(last treated in 2012)who was admitted with ARDS due to influenza, andfound to have new severe systolic congestive heart failure with an EF of 25%.Pt was intubated on 2/9, trach placed at bedside on 2/14. #6 distal XLT placed on 2/24 due to cuff leak. Vent setting on 2/25: CPAP/PS FiO2 40%; PEEP 8; Pt has Cortrak.       SLP Plan  Continue with current plan of care       Recommendations  Diet recommendations: NPO      Patient may use Passy-Muir Speech Valve: with SLP  only PMSV Supervision: Full MD: Please consider changing trach tube to : Cuffless;Smaller size         Oral Care Recommendations: Oral care QID Follow up Recommendations: LTACH SLP Visit Diagnosis: Aphonia (R49.1) Plan: Continue with current plan of care       GO                [name] 03/08/2017, 4:17 PM

## 2017-03-08 NOTE — Progress Notes (Signed)
CSW made aware that per [facility] note, pt has been denied for LTACH at this time. CSW spoke with pt's sister and was informed that RNCM spoke with best friend [name] on yesterday and explained the denial of LTACH. CSW spoke with [name] this morning and updated her that CSW has resented updated information to all vent/snf's that can meet pt's need. CSW explained that two denials were received in the last referral sent out as facilities are not able to take pt due to weight.   Per [name] and her have spoken and they expressed that if pt is no longer needing the vent at the time of discharge then they are wanting to take pt home with trach and hire [facility] services for further care at that time. [name] expressed that pt has expressed not wanting to be placed within a facility nit unless it is inpt rehab here at the hospital. CSW expressed verbal understanding and expressed understanding their wishes for their loved one at this time. CSW will continue to follow up with vent/snf's as needed.    [name], MSW, [facility] Clinical Social Worker [phone]

## 2017-03-08 NOTE — Progress Notes (Signed)
PULMONARY / CRITICAL CARE MEDICINE   Name: [name] MRN: [id number] DOB: Jul 25, 1955    ADMISSION DATE:  02/16/2017 CONSULTATION DATE:  02/16/2017  REFERRING MD:  Regenia Skeeter  CHIEF COMPLAINT:  Dyspnea  HISTORY OF PRESENT ILLNESS:   62y/o male with a history of lymphoma in remission, last treated in 2012 who was admitted with ARDS due to influenza , found to have new severe systolic congestive heart failure with an EF of 25%  SUBJECTIVE: Tolerated TC overnight.  STUDIES:  2/8 echo > EF 25-30% moderately dilated left ventricle, diffuse hypokinesis mild mitral valve regurgitation, left atrium mildly dilated  CULTURES: 2/7 BCx 2 >> negative 2/7 RVP >> influenza A 02/18/2017 BAL>> negative  ANTIBIOTICS: Levaquin 2/3 >2/5; 2/7 x 1 Vancomycin 2/7 >> 2/12 Azactam 2/7 >> 2/12 Tamiflu 2/8 > completed   SIGNIFICANT EVENTS: 2/7 Admit 02/18/2017 intubated for acute respiratory failure 02/18/2017 trach 2/25 tx sdu/trach changed  LINES/TUBES: Left chest port >> accessed 2/7 02/18/2017 orotracheal tube>>2/14 02/18/2017 orogastric tube>>2/14 Trach 2/14>>> changed to #6 shiley XLT distal 2/24   VITAL SIGNS: BP (!) 81/61   Pulse 90   Temp 99.2 F (37.3 C) (Oral)   Resp (!) 25   Ht _0  (1.803 m)   Wt (!) 316 lb 5.8 oz (143.5 kg)   SpO2 91%   BMI 44.12 kg/m   HEMODYNAMICS:    VENTILATOR SETTINGS: FiO2 (%):  [35 %-40 %] 35 % Filed Weights   03/04/17 0455 03/07/17 0500 03/08/17 0500  Weight: (!) 317 lb 7.4 oz (144 kg) (!) 317 lb 7.4 oz (144 kg) (!) 316 lb 5.8 oz (143.5 kg)   INTAKE / OUTPUT:  Intake/Output Summary (Last 24 hours) at 03/08/2017 1424 Last data filed at 03/08/2017 1200 Gross per 24 hour  Intake 670 ml  Output 1625 ml  Net -955 ml    PHYSICAL EXAMINATION: General:  Adult male, morbidly obese, in NAD  HEENT: [place]/AT, PERRL, EOM-I and MMM Neuro: Awake, alert, mouths communications CV: IRIR, Nl S1/S2 and -M/R/G PULM: Coarse BS diffusely GI: obese, +bs,  soft Extremities: warm/dry, BLE chronic venous stasis w/+1 edema Skin: no rashes   LABS:  BMET Recent Labs  Lab 03/06/17 0630 03/07/17 0436 03/08/17 0459  NA 138 137 135  K 3.4* 3.9 4.0  CL 100* 100* 100*  CO2 _1 BUN 30* 30* 22*  CREATININE 0.57* 0.57* 0.58*  GLUCOSE 123* 114* 100*    Electrolytes Recent Labs  Lab 03/05/17 0540 03/06/17 0630 03/07/17 0436 03/08/17 0459  CALCIUM 8.2* 8.0* 8.0* 8.0*  MG 1.9 1.7 1.9  --     CBC Recent Labs  Lab 03/05/17 0540 03/06/17 0606 03/07/17 0436  WBC 12.2* 11.2* 9.9  HGB 13.2 11.7* 11.6*  HCT 41.2 37.4* 36.3*  PLT 242 216 223    Coag's No results for input(s): APTT, INR in the last 168 hours.  Sepsis Markers No results for input(s): LATICACIDVEN, PROCALCITON, O2SATVEN in the last 168 hours.  ABG No results for input(s): PHART, PCO2ART, PO2ART in the last 168 hours.  Liver Enzymes Recent Labs  Lab 03/07/17 0436  AST 33  ALT 42  ALKPHOS 98  BILITOT 2.0*  ALBUMIN 1.9*    Cardiac Enzymes No results for input(s): TROPONINI, PROBNP in the last 168 hours.  Glucose Recent Labs  Lab 03/07/17 1547 03/07/17 1956 03/07/17 2357 03/08/17 0445 03/08/17 0758 03/08/17 1140  GLUCAP 129* 117* 104* 90 81 113*    Imaging I reviewed CXR myself,  trach is in good position.  DISCUSSION: Making good progress with weaning  ASSESSMENT / PLAN:  62 year old male with flu A and ARDS that is now trached.  On exam, coarse BS diffusely.  I reviewed CXR myself, trach is in good position.  Discussed with PCCM-NP.  Respiratory failure:  - Maintain on TC as tolerated, tolerated overnight.  - ?airway protection  Trach status:  - If continues to tolerate TC by Friday then will change trach to a cuffless 6  Hypoxemia:  - Attempt to get to 28% TC if tolerated as that will facilitate placement.  PCCM will continue to follow.  Rush Farmer, M.D. [facility] Pulmonary/Critical Care Medicine. Pager: [phone]. After  hours pager: [phone].

## 2017-03-09 DIAGNOSIS — L0390 Cellulitis, unspecified: Secondary | ICD-10-CM

## 2017-03-09 DIAGNOSIS — Z6841 Body Mass Index (BMI) 40.0 and over, adult: Secondary | ICD-10-CM

## 2017-03-09 DIAGNOSIS — E118 Type 2 diabetes mellitus with unspecified complications: Secondary | ICD-10-CM

## 2017-03-09 DIAGNOSIS — C8200 Follicular lymphoma grade I, unspecified site: Secondary | ICD-10-CM

## 2017-03-09 DIAGNOSIS — J09X1 Influenza due to identified novel influenza A virus with pneumonia: Secondary | ICD-10-CM

## 2017-03-09 LAB — BASIC METABOLIC PANEL
ANION GAP: 9 (ref 5–15)
BUN: 24 mg/dL — ABNORMAL HIGH (ref 6–20)
CHLORIDE: 102 mmol/L (ref 101–111)
CO2: 25 mmol/L (ref 22–32)
Calcium: 7.5 mg/dL — ABNORMAL LOW (ref 8.9–10.3)
Creatinine, Ser: 0.56 mg/dL — ABNORMAL LOW (ref 0.61–1.24)
GFR calc non Af Amer: >60 mL/min (ref >60)
Glucose, Bld: 129 mg/dL — ABNORMAL HIGH (ref 65–99)
POTASSIUM: 3.1 mmol/L — AB (ref 3.5–5.1)
Sodium: 136 mmol/L (ref 135–145)

## 2017-03-09 LAB — GLUCOSE, CAPILLARY
GLUCOSE-CAPILLARY: 112 mg/dL — AB (ref 65–99)
GLUCOSE-CAPILLARY: 125 mg/dL — AB (ref 65–99)
Glucose-Capillary: 124 mg/dL — ABNORMAL HIGH (ref 65–99)
Glucose-Capillary: 114 mg/dL — ABNORMAL HIGH (ref 65–99)
Glucose-Capillary: 105 mg/dL — ABNORMAL HIGH (ref 65–99)

## 2017-03-09 LAB — MAGNESIUM: Magnesium: 1.7 mg/dL (ref 1.7–2.4)

## 2017-03-09 LAB — POTASSIUM: Potassium: 3.4 mmol/L — ABNORMAL LOW (ref 3.5–5.1)

## 2017-03-09 MED ORDER — POTASSIUM CHLORIDE 20 MEQ/15ML (10%) PO SOLN
30.0000 meq | ORAL | Status: AC
  Administered 2017-03-09 (×2): 30 meq
  Filled 2017-03-09 (×2): qty 30

## 2017-03-09 MED ORDER — RESOURCE THICKENUP CLEAR PO POWD
ORAL | Status: DC | PRN
  Filled 2017-03-09: qty 125

## 2017-03-09 MED ORDER — POTASSIUM CHLORIDE 20 MEQ/15ML (10%) PO SOLN: 50.0000 meq | Freq: Once | ORAL | Status: DC

## 2017-03-09 NOTE — Care Management Note (Addendum)
  Case Management Note  Patient Details  Name: [name] MRN: [id number] Date of Birth: [birth year]  Subjective/Objective:   Pt admitted with flu and pulm edema            Action/Plan:   PTA independent from home alone.  Pt currently intubated.    Expected Discharge Date:                  Expected Discharge Plan:  Home/Self Care  In-House Referral:     Discharge planning Services  CM Consult  Post Acute Care Choice:    Choice offered to:     DME Arranged:    DME Agency:     HH Arranged:    HH Agency:     Status of Service:     If discussed at [employer] of Stay Meetings, dates discussed:    Additional Comments: 03/09/2017  CM discussed with sister, significant other and pt - all are in agreement that pt will not be able to discharge directly home - trach SNF actively being sought. Pt remains on 40% FIO2 TC - barrier for SNF placement  03/07/17 LTACH denied by insurance with peer to peer completion.  CM informed both pt and SO - informed them both that if pt can not wean from ventilator discharge plan will likely include SNF [place] of [place].  CSW aware of denial for [facility] and SNF placement need.  Pt is currently tolerating TC.   CM will continue to follow for discharge needs  03/02/17 [facility] referral given by attending - physician advisor in agreement.  Pt is alert and oriented - pt given choice of [facility] and [facility] - both agencies met with pt at bedside.  Pt states his significant other has been in contact with pts sister - pt declined for CM to reach out to sister.  Pt chose [facility] - agency starting insurance [employer] is aware of pts choice   02/24/2017 Pt self extubated and was immediately re intubated/  Pt is now 1 day s/p trach - still requiring vent.  Pt has increased WOB with agonal breaths, plan is to increase sedation and supplemental oxygen Maryclare Labrador, RN 03/09/2017, 11:48 AM

## 2017-03-09 NOTE — Progress Notes (Signed)
[facility] ADULT ICU REPLACEMENT PROTOCOL FOR AM LAB REPLACEMENT ONLY  The patient does apply for the [facility] Adult ICU Electrolyte Replacment Protocol based on the criteria listed below:   1. Is GFR >/= 40 ml/min? Yes.    Patient's GFR today is >60 2. Is urine output >/= 0.5 ml/kg/hr for the last 6 hours? Yes.   Patient's UOP is .61 ml/kg/hr 3. Is BUN < 60 mg/dL? Yes.    Patient's BUN today is 24 4. Abnormal electrolyte(s):  K was 3.1 5. Ordered repletion with: per protocol 6. If a panic level lab has been reported, has the CCM MD in charge been notified? Yes.  .   Physician:  Dr. Harlene Ramus, Philis Nettle 03/09/2017 5:24 AM

## 2017-03-09 NOTE — Procedures (Signed)
Objective Swallowing Evaluation: Type of Study: FEES-Fiberoptic Endoscopic Evaluation of Swallow   Patient Details  Name: [name] MRN: [id number] Date of Birth: 1955-06-06  Today's Date: 03/09/2017 Time: SLP Start Time (ACUTE ONLY): 1337 -SLP Stop Time (ACUTE ONLY): 1359  SLP Time Calculation (min) (ACUTE ONLY): 22 min   Past Medical History:  Past Medical History:  Diagnosis Date  . Dyslipidemia   . Gout attack 01/16/2012  . Hypertension   . nhl dx'd 01/2010  . Venous insufficiency    Past Surgical History:  Past Surgical History:  Procedure Laterality Date  . BRONCHOSCOPY    . ESOPHAGOGASTRODUODENOSCOPY ENDOSCOPY     Dr Jearld Fenton  . INGUINAL HERNIA REPAIR     2001  . PORT-A-CATH REMOVAL     HPI: 62y/o male with a history of lymphoma in remission(last treated in 2012)who was admitted with ARDS due to influenza, andfound to have new severe systolic congestive heart failure with an EF of 25%.Pt was intubated on 2/9, trach placed at bedside on 2/24. #6 distal XLT placed on 2/24 due to cuff leak. Vent setting on 2/25: CPAP/PS FiO2 40%; PEEP 8; Pt has Cortrak.    Subjective: pt alert, pleasant mood    Assessment / Plan / Recommendation  CHL IP CLINICAL IMPRESSIONS 03/09/2017  Clinical Impression Pt has a moderate pharyngeal dysphagia that is likely acute and reversible in nature due to prolonged intubation and overall deconditioning. His arytenoids and surrounding tissues appeared edematous, although direct visualization of the true vocal folds to assess for adequate adduction was somewhat limited by presence of Cortrak. Pt has incomplete airway closure and generalized pharyngeal weakness that results in deep, silent penetration with nectar thick liquids that requires multiple cued coughs to clear (PMV in place for the duration of this study). He has improved airway protection with honey thick liquids and purees, but he also has diffuse pharyngeal residue that increases in  volume with these thicker consistencies. A chin tuck was attempted but did not change the amount of residuals. He is capable of clearing it almost completely with Min-Mod cues to complete four, hard swallows with each bolus. It would be quite laborious to do this across the entirety of a meal tray; however, allowing him small, intermittent snacks from the floor stock would facilitate reconditioning of his swallowing musculature. Would remain NPO except for bites of puree and sips of honey thick liquids with use of PMV and four, effortful swallows. Pt has good prognosis for return to PO diet - SLP will facilitate recovery with pharyngeal exercises and therapeutic PO trials.  SLP Visit Diagnosis Dysphagia, pharyngeal phase (R13.13)  Attention and concentration deficit following --  Frontal lobe and executive function deficit following --  Impact on safety and function Moderate aspiration risk      CHL IP TREATMENT RECOMMENDATION 03/09/2017  Treatment Recommendations Therapy as outlined in treatment plan below     Prognosis 03/09/2017  Prognosis for Safe Diet Advancement Good  Barriers to Reach Goals --  Barriers/Prognosis Comment --    CHL IP DIET RECOMMENDATION 03/09/2017  SLP Diet Recommendations NPO;Alternative means - temporary;Other (Comment)  Liquid Administration via Cup;Spoon;No straw  Medication Administration Via alternative means  Compensations Slow rate;Small sips/bites;Multiple dry swallows after each bite/sip  Postural Changes Seated upright at 90 degrees;Remain semi-upright after after feeds/meals (Comment)      CHL IP OTHER RECOMMENDATIONS 03/09/2017  Recommended Consults --  Oral Care Recommendations Oral care QID  Other Recommendations Order thickener from pharmacy;Prohibited food (jello, ice  cream, thin soups);Remove water pitcher;Have oral suction available      CHL IP FOLLOW UP RECOMMENDATIONS 03/09/2017  Follow up Recommendations LTACH      CHL IP FREQUENCY AND  DURATION 03/09/2017  Speech Therapy Frequency (ACUTE ONLY) min 3x week  Treatment Duration 2 weeks           CHL IP ORAL PHASE 03/09/2017  Oral Phase WFL  Oral - Pudding Teaspoon --  Oral - Pudding Cup --  Oral - Honey Teaspoon --  Oral - Honey Cup --  Oral - Nectar Teaspoon --  Oral - Nectar Cup --  Oral - Nectar Straw --  Oral - Thin Teaspoon --  Oral - Thin Cup --  Oral - Thin Straw --  Oral - Puree --  Oral - Mech Soft --  Oral - Regular --  Oral - Multi-Consistency --  Oral - Pill --  Oral Phase - Comment --    CHL IP PHARYNGEAL PHASE 03/09/2017  Pharyngeal Phase Impaired  Pharyngeal- Pudding Teaspoon --  Pharyngeal --  Pharyngeal- Pudding Cup --  Pharyngeal --  Pharyngeal- Honey Teaspoon --  Pharyngeal --  Pharyngeal- Honey Cup Reduced pharyngeal peristalsis;Reduced epiglottic inversion;Reduced anterior laryngeal mobility;Reduced laryngeal elevation;Reduced airway/laryngeal closure;Reduced tongue base retraction;Pharyngeal residue - valleculae;Pharyngeal residue - pyriform;Pharyngeal residue - posterior pharnyx;Inter-arytenoid space residue;Compensatory strategies attempted (with notebox)  Pharyngeal --  Pharyngeal- Nectar Teaspoon --  Pharyngeal --  Pharyngeal- Nectar Cup Reduced pharyngeal peristalsis;Reduced epiglottic inversion;Reduced anterior laryngeal mobility;Reduced laryngeal elevation;Reduced airway/laryngeal closure;Reduced tongue base retraction;Pharyngeal residue - valleculae;Pharyngeal residue - pyriform;Pharyngeal residue - posterior pharnyx;Inter-arytenoid space residue;Compensatory strategies attempted (with notebox);Penetration/Aspiration during swallow  Pharyngeal Material enters airway, CONTACTS cords and not ejected out  Pharyngeal- Nectar Straw --  Pharyngeal --  Pharyngeal- Thin Teaspoon Reduced pharyngeal peristalsis;Reduced epiglottic inversion;Reduced anterior laryngeal mobility;Reduced laryngeal elevation;Reduced airway/laryngeal closure;Reduced  tongue base retraction;Inter-arytenoid space residue;Compensatory strategies attempted (with notebox)  Pharyngeal --  Pharyngeal- Thin Cup --  Pharyngeal --  Pharyngeal- Thin Straw --  Pharyngeal --  Pharyngeal- Puree Reduced pharyngeal peristalsis;Reduced epiglottic inversion;Reduced anterior laryngeal mobility;Reduced laryngeal elevation;Reduced airway/laryngeal closure;Reduced tongue base retraction;Pharyngeal residue - valleculae;Pharyngeal residue - pyriform;Pharyngeal residue - posterior pharnyx;Inter-arytenoid space residue;Compensatory strategies attempted (with notebox)  Pharyngeal --  Pharyngeal- Mechanical Soft --  Pharyngeal --  Pharyngeal- Regular --  Pharyngeal --  Pharyngeal- Multi-consistency --  Pharyngeal --  Pharyngeal- Pill --  Pharyngeal --  Pharyngeal Comment --     CHL IP CERVICAL ESOPHAGEAL PHASE 03/09/2017  Cervical Esophageal Phase WFL  Pudding Teaspoon --  Pudding Cup --  Honey Teaspoon --  Honey Cup --  Nectar Teaspoon --  Nectar Cup --  Nectar Straw --  Thin Teaspoon --  Thin Cup --  Thin Straw --  Puree --  Mechanical Soft --  Regular --  Multi-consistency --  Pill --  Cervical Esophageal Comment --    No flowsheet data found.  [name] 03/09/2017, 3:43 PM   [name], M.A. CCC-SLP [phone]

## 2017-03-09 NOTE — Evaluation (Signed)
Clinical/Bedside Swallow Evaluation Patient Details  Name: [name] MRN: [id number] Date of Birth: July 01, 1955  Today's Date: 03/09/2017 Time: SLP Start Time (ACUTE ONLY): 1027 SLP Stop Time (ACUTE ONLY): 1052 SLP Time Calculation (min) (ACUTE ONLY): 25 min  Past Medical History:  Past Medical History:  Diagnosis Date  . Dyslipidemia   . Gout attack 01/16/2012  . Hypertension   . nhl dx'd 01/2010  . Venous insufficiency    Past Surgical History:  Past Surgical History:  Procedure Laterality Date  . BRONCHOSCOPY    . ESOPHAGOGASTRODUODENOSCOPY ENDOSCOPY     Dr Jearld Fenton  . INGUINAL HERNIA REPAIR     2001  . PORT-A-CATH REMOVAL     HPI:  62y/o male with a history of lymphoma in remission(last treated in 2012)who was admitted with ARDS due to influenza, andfound to have new severe systolic congestive heart failure with an EF of 25%.Pt was intubated on 2/9, trach placed at bedside on 2/24. #6 distal XLT placed on 2/24 due to cuff leak. Vent setting on 2/25: CPAP/PS FiO2 40%; PEEP 8; Pt has Cortrak.    Assessment / Plan / Recommendation Clinical Impression  Pt appears to have swift oral manipulation with ice chip trials. Delayed coughing was noted x1, but he also has occasional coughing at baseline. His vocal quality is hoarse and reduced in volume. Given his prolonged intubation, new trach, and generalized deconditioning, recommend proceeding with FEES to better assess oropharyngeal function. Will attempt later today. SLP Visit Diagnosis: Dysphagia, unspecified (R13.10)    Aspiration Risk  Moderate aspiration risk    Diet Recommendation NPO   Medication Administration: Via alternative means    Other  Recommendations Oral Care Recommendations: Oral care QID Other Recommendations: Have oral suction available   Follow up Recommendations LTACH      Frequency and Duration            Prognosis Prognosis for Safe Diet Advancement: Good      Swallow Study   General HPI:  62y/o male with a history of lymphoma in remission(last treated in 2012)who was admitted with ARDS due to influenza, andfound to have new severe systolic congestive heart failure with an EF of 25%.Pt was intubated on 2/9, trach placed at bedside on 2/24. #6 distal XLT placed on 2/24 due to cuff leak. Vent setting on 2/25: CPAP/PS FiO2 40%; PEEP 8; Pt has Cortrak.  Type of Study: Bedside Swallow Evaluation Previous Swallow Assessment: none in chart Diet Prior to this Study: NPO;NG Tube Temperature Spikes Noted: No Respiratory Status: Trach;Trach Collar Trach Size and Type: Cuff;#6;Deflated;With PMSV in place History of Recent Intubation: Yes Length of Intubations (days): 15 days Date extubated: (trach 2/24) Behavior/Cognition: Alert;Cooperative;Pleasant mood Oral Cavity Assessment: Within Functional Limits Oral Care Completed by SLP: No Oral Cavity - Dentition: Adequate natural dentition Self-Feeding Abilities: Needs assist Patient Positioning: Upright in bed Baseline Vocal Quality: Hoarse;Low vocal intensity Volitional Cough: Strong    Oral/Motor/Sensory Function Overall Oral Motor/Sensory Function: (appears functional)   Ice Chips Ice chips: Impaired Presentation: Spoon Pharyngeal Phase Impairments: Cough - Delayed   Thin Liquid Thin Liquid: Not tested    Nectar Thick Nectar Thick Liquid: Not tested   Honey Thick Honey Thick Liquid: Not tested   Puree Puree: Not tested   Solid   GO   Solid: Not tested        [name] 03/09/2017,2:32 PM  [name], M.A. CCC-SLP [phone]

## 2017-03-09 NOTE — Progress Notes (Signed)
Nutrition Follow-up  DOCUMENTATION CODES:   Morbid obesity  INTERVENTION:   Continue:  Vital High Protein at 50 ml/h (1200 ml per day)  Pro-stat 60 ml TID  Provides 1800 kcal, 195 gm protein, 1003 ml free water daily  NUTRITION DIAGNOSIS:   Inadequate oral intake related to inability to eat as evidenced by NPO status.  Ongoing  GOAL:   Provide needs based on ASPEN/SCCM guidelines  Met with TF   MONITOR:   Vent status, TF tolerance, Labs, I & O's  ASSESSMENT:   62 yo male with PMH of HTN, venous insufficiency, HLD, gout, NHL (in remission, last treated in 2012) who was admitted on 2/7 with acute hypoxemia respiratory failure, influenza A, and pulmonary edema. Required intubation on 2/9.  Discussed patient in ICU rounds and with RN today.  Patient is currently receiving Vital High Protein via Cortrak tube at 50 ml/h (1200 ml/day) with Prostat 60 ml TID to provide 1800 kcals, 195 gm protein, 1003 ml free water daily.  Patient is currently on trach collar. S/P FEES with SLP today, remains NPO. Labs reviewed. Potassium 3.1, 3.4 (L) CBG's: [phone] Medications reviewed and include Lasix.  Diet Order:  Diet NPO time specified Except for: Other (See Comments)  EDUCATION NEEDS:   No education needs have been identified at this time  Skin:  Skin Assessment: Skin Integrity Issues: Skin Integrity Issues:: Stage II, DTI DTI: coccyx Stage II: R buttocks Other: venous stasis ulcer to R ankle and L toe  Last BM:  2/22  Height:   Ht Readings from Last 1 Encounters:  02/27/17 _0  (1.803 m)    Weight:   Wt Readings from Last 1 Encounters:  03/09/17 (!) 330 lb 11 oz (150 kg)    Ideal Body Weight:  78.18 kg  BMI:  Body mass index is 46.12 kg/m.  Estimated Nutritional Needs:   Kcal:  9753-0051  Protein:  175-195 gm  Fluid:  2 L   Molli Barrows, RD, LDN, [place] Pager [phone] After Hours Pager [phone]

## 2017-03-09 NOTE — Progress Notes (Signed)
Size 5 shiley XLT uncuffed trach to bedside as requested by Dr Nelda Marseille for trach exchange 03/10/17.

## 2017-03-09 NOTE — Progress Notes (Addendum)
Physical Therapy Treatment Patient Details Name: [name] MRN: [id number] DOB: May 08, 1955 Today's Date: 03/09/2017    History of Present Illness Pt is a 62 y/o male history of lymphoma in remission admitted with acute on chronic hypoxemic and hypercapnic respiratory failure due to influenza A.  He was also noted to have a new LVEF of 25% with contribution of cardiogenic edema.  He failed extubation on 2/14.  Based on his need for continued ventilatory support he underwent percutaneous tracheostomy on 2/14.    PT Comments    Pt admitted with above diagnosis. Pt currently with functional limitations due to balance and endurance deficits. Pt was able to tolerate tilt bed for 12 min in 60 degree position with pt in great spirits.  More interactive today.  Progressing however slowly.  Met 0/5 goals due to medical complications over last few days.  Goals revised today.   Pt will benefit from skilled PT to increase their independence and safety with mobility to allow discharge to the venue listed below.     Follow Up Recommendations  LTACH;Other (comment)(vs SNF based on vent needs)     Equipment Recommendations  None recommended by PT    Recommendations for Other Services       Precautions / Restrictions Precautions Precautions: Fall Precaution Comments: trach, cortrak Restrictions Weight Bearing Restrictions: No    Mobility  Bed Mobility Overal bed mobility: Needs Assistance Bed Mobility: Rolling;Sidelying to Sit;Sit to Supine Rolling: Max assist;+2 for physical assistance         General bed mobility comments: NT due to Vital Go bed.  Total assist to scoot up in bed.  Transfers Overall transfer level: Needs assistance Equipment used: (Vital Go tilt bed) Transfers: Sit to/from Stand Sit to Stand: +2 physical assistance;Max assist         General transfer comment: Pt progressively inclined in Vital Go tilt bed to 60 degree position for 12 minutes.  Pt performed quad  sets/semi squats x 25 while in this position. BP initially 103/72 on arrival and then 95/61 in  30 degree, and 107/68 once settled 60 degree standing.  BP once back in supine 118/81.  O2 sats registering at 88-95% with pt on 35% trach collar.  Overall sat 88% and above.    Ambulation/Gait                 Stairs            Wheelchair Mobility    Modified Rankin (Stroke Patients Only)       Balance                                            Cognition Arousal/Alertness: Awake/alert Behavior During Therapy: WFL for tasks assessed/performed Overall Cognitive Status: Within Functional Limits for tasks assessed                                 General Comments: Mouths words, follows commands; ST placed PMV with pt talking      Exercises General Exercises - Lower Extremity Ankle Circles/Pumps: AROM;Both;20 reps;Supine Quad Sets: AROM;Both;10 reps;Supine Gluteal Sets: AROM;Both;10 reps;Supine Heel Slides: AROM;Both;10 reps;Supine Mini-Sqauts: AROM;Both;Standing;20 reps    General Comments        Pertinent Vitals/Pain Pain Assessment: Faces Faces Pain Scale: Hurts even more Pain Location: thumb  Pain Descriptors / Indicators: Aching;Grimacing;Guarding Pain Intervention(s): Limited activity within patient's tolerance;Monitored during session;Premedicated before session;Repositioned    Home Living                      Prior Function            PT Goals (current goals can now be found in the care plan section) Acute Rehab PT Goals PT Goal Formulation: With patient Time For Goal Achievement: 03/23/17 Potential to Achieve Goals: Fair Progress towards PT goals: Progressing toward goals    Frequency    Min 2X/week      PT Plan Current plan remains appropriate    Co-evaluation   Reason for Co-Treatment: Complexity of the patient's impairments (multi-system involvement);For patient/therapist safety     SLP  goals addressed during session: Swallowing;Communication    AM-PAC PT "6 Clicks" Daily Activity  Outcome Measure  Difficulty turning over in bed (including adjusting bedclothes, sheets and blankets)?: Unable Difficulty moving from lying on back to sitting on the side of the bed? : Unable Difficulty sitting down on and standing up from a chair with arms (e.g., wheelchair, bedside commode, etc,.)?: Unable Help needed moving to and from a bed to chair (including a wheelchair)?: Total Help needed walking in hospital room?: Total Help needed climbing 3-5 steps with a railing? : Total 6 Click Score: 6    End of Session Equipment Utilized During Treatment: (trach collar) Activity Tolerance: Patient limited by fatigue Patient left: in bed;with call bell/phone within reach;with family/visitor present Nurse Communication: Mobility status;Need for lift equipment PT Visit Diagnosis: Other abnormalities of gait and mobility (R26.89)     Time: 1518-3437 PT Time Calculation (min) (ACUTE ONLY): 44 min  Charges:  $Therapeutic Exercise: 8-22 mins $Therapeutic Activity: 23-37 mins                    G Codes:       Nusaybah Ivie,PT Acute Rehabilitation [phone] (pager)    Denice Paradise 03/09/2017, 1:42 PM

## 2017-03-09 NOTE — Progress Notes (Signed)
CSW still following for either vent/snf or trach snf placement for pt at this time. In order for pt to be considered for trach snf pt must be at 28 percent trach collar.     Virgie Dad Tenzin Edelman, MSW, [facility] Clinical Social Worker [phone]

## 2017-03-09 NOTE — Progress Notes (Signed)
  Speech Language Pathology Treatment: Nada Boozer Speaking valve  Patient Details Name: [name] MRN: [id number] DOB: 04-10-1955 Today's Date: 03/09/2017 Time: 8413-2440 SLP Time Calculation (min) (ACUTE ONLY): 10 min  Assessment / Plan / Recommendation Clinical Impression  Pt wore the valve for almost 30 minutes with no signs of overt intolerance. His RR did fluctuate up to the low 30s, but this was also while he was upright in the tilt bed working with PT. His voicing is hoarse and lower in intensity, but he can communicate at the sentence level with Min cues for increased volume. Recommend that pt wear the PMV during waking hours as tolerated. It was removed prior to SLP departure as pt was wanting to nap.   HPI HPI: 61y/o male with a history of lymphoma in remission(last treated in 2012)who was admitted with ARDS due to influenza, andfound to have new severe systolic congestive heart failure with an EF of 25%.Pt was intubated on 2/9, trach placed at bedside on 2/14. #6 distal XLT placed on 2/24 due to cuff leak. Vent setting on 2/25: CPAP/PS FiO2 40%; PEEP 8; Pt has Cortrak.       SLP Plan  Continue with current plan of care       Recommendations         Patient may use Passy-Muir Speech Valve: During all therapies with supervision;During all waking hours (remove during sleep) PMSV Supervision: Intermittent MD: Please consider changing trach tube to : Cuffless;Smaller size         Oral Care Recommendations: Oral care QID Follow up Recommendations: LTACH SLP Visit Diagnosis: Aphonia (R49.1) Plan: Continue with current plan of care       GO                [name] 03/09/2017, 12:14 PM  [name], M.A. CCC-SLP [phone]

## 2017-03-09 NOTE — Progress Notes (Signed)
PROGRESS NOTE    [name]  XNA:[id number] DOB: 21-Nov-1955 DOA: 02/16/2017 PCP: [name], MD   Brief Narrative:  62y/o WM PMHx lymphoma in remission (last treated in 2012), HTN, dyslipidemia, gout  Admitted with ARDS due to influenza, and found to have new severe systolic congestive heart failure with an EF of 25%.     Subjective: 2/28  A/O 4, negative CP, negative abdominal pain, negative N/V. Positive acute SOB (tracheostomy). Currently on trach collar with PMV. Per wife at home -02, ambulates without assistance except for occasional cane when having gout attack.   Assessment & Plan:   Active Problems:   Respiratory failure, unspecified with hypoxia (HCC)   Acute respiratory failure (HCC)   Acute respiratory failure with hypoxemia (HCC)   Status post tracheostomy ([place])   Pressure injury of skin   Hypoxic  ARDS/influenza A pneumonia -S/P trach -Patient has been off vent sets 2/26. -Aggressive pulmonary toilet -Titrate O2 to maintain SPO2> 93% -DuoNeb TID -Frequent tracheal suctioning PRN  Acute Systolic CHF -LVEF = 54% -Strict in and out -Daily weight -Cardiology following -Viral cardiomyopathy? Coronary eval per cardiology. -Amlodipine 5 mg daily -ASA 81 mg daily -Coreg 25 mg BID -Lasix IV 60 mg BID -Losartan 50 mg daily  New onset atrial fibrillation -Rate controlled -Eliquis 5 mg BID  Acute pulmonary edema -See systolic CHF   Dysphagia  -Scheduled for  FEES 2/28  -Currently  CorTrak in place.    Hypomagnesemia -   Hypokalemia -Potassium goal> 4 -Potassium 60 mEq -Recheck K/Mg '@1500'$    Non-Hodgkin's lymphoma -Port-A-Cath in LEFT chest -Per patient and wife in remission.  Bilateral foot wounds -Per wound care   DM Type 2   controlled with complication -2/7 Hemoglobin A1c = 6.3   Cellulitis on back -Complete course of antibiotics. Using clindamycin secondary to patient's PCN allergy -Will need to monitor closely for C. difficile   Extreme DECONDITIONING  -Will require extensive rehabilitation. Unsure patient will qualify for CIR but will have patient evaluated. -Spoke at length with wife and patient today understands must participate with PT/OT or will not qualify for CIR or SNF    Anxiety/depression    Morbid obesity  - Body mass index is 44.12 kg/m.    DVT prophylaxis: Eliquis Code Status: Full Family Communication: Wife at bedside for discussion of plan care Disposition Plan:  CIR  Vs SNF?   Consultants:  [name]   Procedures/Significant Events:  2/7 admit  2/8 TTE - EF 25-30% - diffuse hypokinesis - mild mitral regurg 2/9 intubated  2/14 tracheostomy  2/24 trach changed to #6 distal XLT due to cuff leak     I have personally reviewed and interpreted all radiology studies and my findings are as above.  VENTILATOR SETTINGS:    Cultures     Antimicrobials: Anti-infectives (From admission, onward)   Start     Stop   03/08/17 1200  clindamycin (CLEOCIN) capsule 300 mg     03/12/17 1759   03/07/17 1800  clindamycin (CLEOCIN) capsule 300 mg  Status:  Discontinued     03/08/17 1003   02/20/17 1400  aztreonam (AZACTAM) 1 g in sodium chloride 0.9 % 100 mL IVPB  Status:  Discontinued     02/21/17 1020   02/19/17 0800  vancomycin (VANCOCIN) 1,500 mg in sodium chloride 0.9 % 500 mL IVPB  Status:  Discontinued     02/21/17 1020   02/18/17 2200  oseltamivir (TAMIFLU) 6 MG/ML suspension 75 mg  02/21/17 2112   02/17/17 1000  oseltamivir (TAMIFLU) capsule 75 mg  Status:  Discontinued     02/18/17 1412   02/17/17 0600  vancomycin (VANCOCIN) 1,250 mg in sodium chloride 0.9 % 250 mL IVPB  Status:  Discontinued     02/19/17 0720   02/17/17 0200  aztreonam (AZACTAM) 1 g in dextrose 5 % 50 mL IVPB  Status:  Discontinued     02/20/17 0844   02/16/17 1845  aztreonam (AZACTAM) 2 g in dextrose 5 % 50 mL IVPB     02/16/17 1957   02/16/17 1830  vancomycin (VANCOCIN) 2,500 mg in sodium  chloride 0.9 % 500 mL IVPB     02/16/17 2320   02/16/17 1745  levofloxacin (LEVAQUIN) IVPB 750 mg     02/16/17 1957       Devices    LINES / TUBES:      Continuous Infusions: . feeding supplement (VITAL HIGH PROTEIN) 1,000 mL (03/09/17 0600)     Objective: Vitals:   03/09/17 0400 03/09/17 0421 03/09/17 0500 03/09/17 0600  BP: 111/80  101/67 118/74  Pulse: 84  81 87  Resp: 20     Temp:      TempSrc:      SpO2: 91%  91% 93%  Weight:  (!) 330 lb 11 oz (150 kg)    Height:        Intake/Output Summary (Last 24 hours) at 03/09/2017 0734 Last data filed at 03/09/2017 0600 Gross per 24 hour  Intake 564.33 ml  Output 1945 ml  Net -1380.67 ml   Filed Weights   03/07/17 0500 03/08/17 0500 03/09/17 0421  Weight: (!) 317 lb 7.4 oz (144 kg) (!) 316 lb 5.8 oz (143.5 kg) (!) 330 lb 11 oz (150 kg)    Examination:  General: A/O 4, positive  acute respiratory distress (now on trach) Neck:  Negative scars, masses, torticollis, lymphadenopathy, JVD, Shiley in place, negative sign of infection Lungs: positive diffuse rhonchi, positive productive cough thick yellow, negative wheezes, negative crackles  Cardiovascular: Tachycardic, Regular rhythm without murmur gallop or rub normal S1 and S2 Abdomen: Morbidly obese, negative abdominal pain, nondistended, positive soft, bowel sounds, no rebound, no ascites, no appreciable mass Extremities: No significant cyanosis, clubbing, positive bilateral lower extremity edema 2+ (per wife baseline)  Skin: Negative rashes, lesions, ulcers Psychiatric:  Positive depression, negative anxiety, negative fatigue, negative mania  Central nervous system:  Cranial nerves II through XII intact, tongue/uvula midline, all extremities muscle strength 5/5, sensation intact throughout, negative dysarthria, negative expressive aphasia, negative receptive aphasia.  .     Data Reviewed: Care during the described time interval was provided by me .  I have  reviewed this patient's available data, including medical history, events of note, physical examination, and all test results as part of my evaluation.   CBC: Recent Labs  Lab 03/03/17 0508 03/04/17 0442 03/05/17 0540 03/06/17 0606 03/07/17 0436  WBC 11.2* 10.9* 12.2* 11.2* 9.9  HGB 13.6 13.1 13.2 11.7* 11.6*  HCT 42.4 41.1 41.2 37.4* 36.3*  MCV 93.4 93.8 93.2 94.2 93.6  PLT 194 201 242 216 161   Basic Metabolic Panel: Recent Labs  Lab 03/03/17 0508 03/04/17 0442  03/05/17 0540 03/06/17 0630 03/07/17 0436 03/08/17 0459 03/09/17 0359  NA 146* 142  --  139 138 137 135 136  K 3.2* 3.3*   < > 3.7 3.4* 3.9 4.0 3.1*  CL 104 100*  --  100* 100* 100* 100* 102  CO2  31 31  --  '29 29 28 27 25  '$ GLUCOSE 115* 119*  --  111* 123* 114* 100* 129*  BUN 41* 30*  --  31* 30* 30* 22* 24*  CREATININE 0.64 0.55*  --  0.55* 0.57* 0.57* 0.58* 0.56*  CALCIUM 8.5* 8.3*  --  8.2* 8.0* 8.0* 8.0* 7.5*  MG 1.9 1.7  --  1.9 1.7 1.9  --   --    < > = values in this interval not displayed.   GFR: Estimated Creatinine Clearance: 144.3 mL/min (A) (by C-G formula based on SCr of 0.56 mg/dL (L)). Liver Function Tests: Recent Labs  Lab 03/07/17 0436  AST 33  ALT 42  ALKPHOS 98  BILITOT 2.0*  PROT 5.6*  ALBUMIN 1.9*   No results for input(s): LIPASE, AMYLASE in the last 168 hours. No results for input(s): AMMONIA in the last 168 hours. Coagulation Profile: No results for input(s): INR, PROTIME in the last 168 hours. Cardiac Enzymes: No results for input(s): CKTOTAL, CKMB, CKMBINDEX, TROPONINI in the last 168 hours. BNP (last 3 results) No results for input(s): PROBNP in the last 8760 hours. HbA1C: No results for input(s): HGBA1C in the last 72 hours. CBG: Recent Labs  Lab 03/08/17 1140 03/08/17 1524 03/08/17 1946 03/08/17 2317 03/09/17 0357  GLUCAP 113* 113* 109* 129* 112*   Lipid Profile: No results for input(s): CHOL, HDL, LDLCALC, TRIG, CHOLHDL, LDLDIRECT in the last 72  hours. Thyroid Function Tests: No results for input(s): TSH, T4TOTAL, FREET4, T3FREE, THYROIDAB in the last 72 hours. Anemia Panel: No results for input(s): VITAMINB12, FOLATE, FERRITIN, TIBC, IRON, RETICCTPCT in the last 72 hours. Urine analysis: No results found for: COLORURINE, APPEARANCEUR, LABSPEC, PHURINE, GLUCOSEU, HGBUR, BILIRUBINUR, KETONESUR, PROTEINUR, UROBILINOGEN, NITRITE, LEUKOCYTESUR Sepsis Labs: '@LABRCNTIP'$ (procalcitonin:4,lacticidven:4)  )No results found for this or any previous visit (from the past 240 hour(s)).       Radiology Studies: No results found.      Scheduled Meds: . allopurinol  300 mg Per Tube Daily  . amLODipine  5 mg Per Tube Daily  . apixaban  5 mg Per Tube BID  . aspirin  81 mg Per Tube Daily  . atorvastatin  20 mg Per Tube q1800  . carvedilol  25 mg Per Tube BID WC  . chlorhexidine gluconate (MEDLINE KIT)  15 mL Mouth Rinse BID  . Chlorhexidine Gluconate Cloth  6 each Topical Daily  . Chlorhexidine Gluconate Cloth  6 each Topical Daily  . clindamycin  300 mg Per Tube Q6H  . febuxostat  40 mg Per Tube Daily  . feeding supplement (PRO-STAT SUGAR FREE 64)  60 mL Per Tube TID  . furosemide  60 mg Intravenous Q12H  . insulin aspart  0-15 Units Subcutaneous Q4H  . ipratropium-albuterol  3 mL Nebulization TID  . losartan  50 mg Per Tube Daily  . mouth rinse  15 mL Mouth Rinse QID  . Melatonin  6 mg Per Tube QHS  . mupirocin cream   Topical Daily  . pantoprazole sodium  40 mg Per Tube Q1200  . potassium chloride  30 mEq Per Tube Q4H  . sertraline  50 mg Per Tube Daily  . sodium chloride flush  10-40 mL Intracatheter Q12H  . sodium chloride flush  10-40 mL Intracatheter Q12H   Continuous Infusions: . feeding supplement (VITAL HIGH PROTEIN) 1,000 mL (03/09/17 0600)     LOS: 21 days    Time spent: 40 minutes    Sheralyn Pinegar, Geraldo Docker, MD  [facility] Pager [phone]   If 7PM-7AM, please contact  night-coverage [email or web address] Password [facility] 03/09/2017, 7:34 AM

## 2017-03-10 DIAGNOSIS — Z930 Tracheostomy status: Secondary | ICD-10-CM

## 2017-03-10 HISTORY — PX: TRACHEOSTOMY: SUR1362

## 2017-03-10 LAB — IRON AND TIBC
Iron: 23 ug/dL — ABNORMAL LOW (ref 45–182)
Saturation Ratios: 17 % — ABNORMAL LOW (ref 17.9–39.5)
TIBC: 136 ug/dL — AB (ref 250–450)
UIBC: 113 ug/dL

## 2017-03-10 LAB — BASIC METABOLIC PANEL
ANION GAP: 11 (ref 5–15)
BUN: 27 mg/dL — AB (ref 6–20)
CHLORIDE: 101 mmol/L (ref 101–111)
CO2: 27 mmol/L (ref 22–32)
Calcium: 8.3 mg/dL — ABNORMAL LOW (ref 8.9–10.3)
Creatinine, Ser: 0.6 mg/dL — ABNORMAL LOW (ref 0.61–1.24)
GFR calc Af Amer: >60 mL/min (ref >60)
GFR calc non Af Amer: >60 mL/min (ref >60)
GLUCOSE: 131 mg/dL — AB (ref 65–99)
Potassium: 3.2 mmol/L — ABNORMAL LOW (ref 3.5–5.1)
Sodium: 139 mmol/L (ref 135–145)

## 2017-03-10 LAB — GLUCOSE, CAPILLARY
GLUCOSE-CAPILLARY: 110 mg/dL — AB (ref 65–99)
GLUCOSE-CAPILLARY: 113 mg/dL — AB (ref 65–99)
GLUCOSE-CAPILLARY: 124 mg/dL — AB (ref 65–99)
Glucose-Capillary: 106 mg/dL — ABNORMAL HIGH (ref 65–99)
Glucose-Capillary: 126 mg/dL — ABNORMAL HIGH (ref 65–99)
Glucose-Capillary: 107 mg/dL — ABNORMAL HIGH (ref 65–99)

## 2017-03-10 LAB — RETICULOCYTES
RBC.: 3.84 MIL/uL — ABNORMAL LOW (ref 4.22–5.81)
RETIC COUNT ABSOLUTE: 69.1 10*3/uL (ref 19.0–186.0)
Retic Ct Pct: 1.8 % (ref 0.4–3.1)

## 2017-03-10 LAB — MAGNESIUM: MAGNESIUM: 1.8 mg/dL (ref 1.7–2.4)

## 2017-03-10 LAB — FERRITIN: FERRITIN: 1624 ng/mL — AB (ref 24–336)

## 2017-03-10 LAB — VITAMIN B12: Vitamin B-12: 709 pg/mL (ref 180–914)

## 2017-03-10 LAB — FOLATE: FOLATE: 18.3 ng/mL (ref >5.9)

## 2017-03-10 MED ORDER — POTASSIUM CHLORIDE 20 MEQ/15ML (10%) PO SOLN
30.0000 meq | ORAL | Status: AC
  Administered 2017-03-10 (×2): 30 meq
  Filled 2017-03-10 (×2): qty 30

## 2017-03-10 MED ORDER — COLLAGENASE 250 UNIT/GM EX OINT
TOPICAL_OINTMENT | Freq: Every day | CUTANEOUS | Status: DC
  Administered 2017-03-11 – 2017-03-14 (×4): via TOPICAL
  Administered 2017-03-15: 1 via TOPICAL
  Administered 2017-03-16 – 2017-03-22 (×7): via TOPICAL
  Filled 2017-03-10 (×2): qty 30

## 2017-03-10 MED ORDER — MAGNESIUM SULFATE 2 GM/50ML IV SOLN
2.0000 g | Freq: Once | INTRAVENOUS | Status: AC
  Administered 2017-03-10: 2 g via INTRAVENOUS
  Filled 2017-03-10: qty 50

## 2017-03-10 NOTE — Progress Notes (Signed)
Physical Therapy Treatment Patient Details Name: [name] MRN: [id number] DOB: [birth year] Today's Date: 03/10/2017    History of Present Illness Pt is a 62 y/o male history of lymphoma in remission admitted with acute on chronic hypoxemic and hypercapnic respiratory failure due to influenza A.  He was also noted to have a new LVEF of 25% with contribution of cardiogenic edema.  He failed extubation on 2/14.  Based on his need for continued ventilatory support he underwent percutaneous tracheostomy on 2/14.    PT Comments    Pt admitted with above diagnosis. Pt currently with functional limitations due to the deficits listed below (see PT Problem List). Pt was able to tolerate standing in Vital Go tilt bed for 15 minutes.  STraps removed and attempts for pt to step around to chair unsuccessful as pt anxious about moving LEs therefore laid Tilt bed back down and assisted pt to EOB for squat pivot transfer to chair.  Pt looked great in chair and nursing will use lift to get pt back to bed.  Pt happy.  PMV in place today with treatment with VSS.  Pt will benefit from skilled PT to increase their independence and safety with mobility to allow discharge to the venue listed below.     Follow Up Recommendations  LTACH;Other (comment)(vs SNF based on vent needs)     Equipment Recommendations  None recommended by PT    Recommendations for Other Services       Precautions / Restrictions Precautions Precautions: Fall Precaution Comments: trach, cortrak Restrictions Weight Bearing Restrictions: No    Mobility  Bed Mobility Overal bed mobility: Needs Assistance Bed Mobility: Supine to Sit;Sit to Supine     Supine to sit: +2 for physical assistance;Mod assist     General bed mobility comments: assist for LEs to EOB and to raise trunk, pt able to position his hips at EOB with supervision  Transfers Overall transfer level: Needs assistance Equipment used: (Vital Go tilt bed) Transfers:  Squat Pivot Transfers     Squat pivot transfers: +2 physical assistance;Max assist(3rd person present for safety. )     General transfer comment: inclined without progression with Vital Go bed to 83 degrees with pt able to perform squats with support of RW and gradual removal of straps, pt with anxiety, not able to take steps for transfer to chair.  Laid pt back down and pt squat pivot with +3 assist to chair.  Pt stood a total of 15 minutes.    Ambulation/Gait                 Stairs            Wheelchair Mobility    Modified Rankin (Stroke Patients Only)       Balance Overall balance assessment: Needs assistance Sitting-balance support: Single extremity supported;Feet unsupported Sitting balance-Leahy Scale: Fair Sitting balance - Comments: statically Postural control: Posterior lean;Left lateral lean Standing balance support: Bilateral upper extremity supported;During functional activity Standing balance-Leahy Scale: Poor Standing balance comment: pt able to stand with assist of Vital Go bed and walker for support with gradual reduction in number of straps                            Cognition Arousal/Alertness: Awake/alert Behavior During Therapy: Anxious Overall Cognitive Status: Difficult to assess  General Comments: follows commands, some tangential comments, pt with low volume, difficult to understand at times      Exercises General Exercises - Lower Extremity Gluteal Sets: AROM;Both;10 reps;Supine Mini-Sqauts: AROM;Both;Standing;20 reps Other Exercises Other Exercises: R shoulder flexion x 10 with 2# weight, AAROM with bar--L UE.Educated Renee regarding exercise.     General Comments General comments (skin integrity, edema, etc.): Trach collar 35 % with VSS       Pertinent Vitals/Pain Pain Assessment: No/denies pain    Home Living                      Prior Function             PT Goals (current goals can now be found in the care plan section) Acute Rehab PT Goals Patient Stated Goal: wanting to work on anything that will make him stronger Progress towards PT goals: Progressing toward goals    Frequency    Min 2X/week      PT Plan Current plan remains appropriate    Co-evaluation PT/OT/SLP Co-Evaluation/Treatment: Yes Reason for Co-Treatment: Complexity of the patient's impairments (multi-system involvement) PT goals addressed during session: Mobility/safety with mobility OT goals addressed during session: Strengthening/ROM      AM-PAC PT "6 Clicks" Daily Activity  Outcome Measure  Difficulty turning over in bed (including adjusting bedclothes, sheets and blankets)?: A Lot Difficulty moving from lying on back to sitting on the side of the bed? : A Lot Difficulty sitting down on and standing up from a chair with arms (e.g., wheelchair, bedside commode, etc,.)?: A Lot Help needed moving to and from a bed to chair (including a wheelchair)?: Total Help needed walking in hospital room?: Total Help needed climbing 3-5 steps with a railing? : Total 6 Click Score: 9    End of Session Equipment Utilized During Treatment: (trach collar) Activity Tolerance: Patient limited by fatigue Patient left: with call bell/phone within reach;with family/visitor present;in chair Nurse Communication: Mobility status;Need for lift equipment PT Visit Diagnosis: Other abnormalities of gait and mobility (R26.89)     Time: 2637-8588 PT Time Calculation (min) (ACUTE ONLY): 63 min  Charges:  $Therapeutic Exercise: 8-22 mins $Therapeutic Activity: 8-22 mins                    G Codes:       Tryniti Laatsch,PT Acute Rehabilitation [phone] (pager)    Denice Paradise 03/10/2017, 3:13 PM

## 2017-03-10 NOTE — Progress Notes (Addendum)
PULMONARY / CRITICAL CARE MEDICINE   Name: [name] MRN: [id number] DOB: [birth year]    ADMISSION DATE:  02/16/2017 CONSULTATION DATE:  02/16/2017  REFERRING MD:  Regenia Skeeter  CHIEF COMPLAINT:  Dyspnea  HISTORY OF PRESENT ILLNESS:   62y/o male with a history of lymphoma in remission, last treated in 2012 who was admitted with ARDS due to influenza , found to have new severe systolic congestive heart failure with an EF of 25%  SUBJECTIVE: Tolerated TC overnight.  STUDIES:  2/8 echo > EF 25-30% moderately dilated left ventricle, diffuse hypokinesis mild mitral valve regurgitation, left atrium mildly dilated  CULTURES: 2/7 BCx 2 >> negative 2/7 RVP >> influenza A 02/18/2017 BAL>> negative  ANTIBIOTICS: Levaquin 2/3 >2/5; 2/7 x 1 Vancomycin 2/7 >> 2/12 Azactam 2/7 >> 2/12 Tamiflu 2/8 > completed   SIGNIFICANT EVENTS: 2/7 Admit 02/18/2017 intubated for acute respiratory failure 02/18/2017 trach 2/25 tx sdu/trach changed  LINES/TUBES: Left chest port >> accessed 2/7 02/18/2017 orotracheal tube>>2/14 02/18/2017 orogastric tube>>2/14 Trach 2/14>>> changed to #6 shiley XLT distal 2/24   VITAL SIGNS: BP 126/90   Pulse 86   Temp 98 F (36.7 C) (Oral)   Resp 19   Ht _0  (1.803 m)   Wt (!) 330 lb 11 oz (150 kg)   SpO2 96%   BMI 46.12 kg/m   HEMODYNAMICS:    VENTILATOR SETTINGS: FiO2 (%):  [40 %] 40 % Filed Weights   03/07/17 0500 03/08/17 0500 03/09/17 0421  Weight: (!) 317 lb 7.4 oz (144 kg) (!) 316 lb 5.8 oz (143.5 kg) (!) 330 lb 11 oz (150 kg)   INTAKE / OUTPUT:  Intake/Output Summary (Last 24 hours) at 03/10/2017 1117 Last data filed at 03/10/2017 0600 Gross per 24 hour  Intake 1000 ml  Output 650 ml  Net 350 ml    PHYSICAL EXAMINATION: General:  Adult male, morbidly obese, in NAD  HEENT: [place]/AT, PERRL, EOM-I and MMM Neuro: Awake, alert, mouths communications CV: IRIR, Nl S1/S2 and -M/R/G PULM: Coarse BS diffusely GI: obese, +bs, soft Extremities: warm/dry,  BLE chronic venous stasis w/+1 edema Skin: no rashes   LABS:  BMET Recent Labs  Lab 03/08/17 0459 03/09/17 0359 03/09/17 1436 03/10/17 0430  NA 135 136  --  139  K 4.0 3.1* 3.4* 3.2*  CL 100* 102  --  101  CO2 27 25  --  27  BUN 22* 24*  --  27*  CREATININE 0.58* 0.56*  --  0.60*  GLUCOSE 100* 129*  --  131*    Electrolytes Recent Labs  Lab 03/07/17 0436 03/08/17 0459 03/09/17 0359 03/09/17 1436 03/10/17 0430  CALCIUM 8.0* 8.0* 7.5*  --  8.3*  MG 1.9  --   --  1.7 1.8    CBC Recent Labs  Lab 03/05/17 0540 03/06/17 0606 03/07/17 0436  WBC 12.2* 11.2* 9.9  HGB 13.2 11.7* 11.6*  HCT 41.2 37.4* 36.3*  PLT 242 216 223    Coag's No results for input(s): APTT, INR in the last 168 hours.  Sepsis Markers No results for input(s): LATICACIDVEN, PROCALCITON, O2SATVEN in the last 168 hours.  ABG No results for input(s): PHART, PCO2ART, PO2ART in the last 168 hours.  Liver Enzymes Recent Labs  Lab 03/07/17 0436  AST 33  ALT 42  ALKPHOS 98  BILITOT 2.0*  ALBUMIN 1.9*    Cardiac Enzymes No results for input(s): TROPONINI, PROBNP in the last 168 hours.  Glucose Recent Labs  Lab 03/09/17 1207  03/09/17 1548 03/09/17 1954 03/09/17 2345 03/10/17 0331 03/10/17 0740  GLUCAP 114* 125* 105* 113* 110* 106*    Imaging I reviewed CXR myself, trach is in good position  DISCUSSION: Making good progress with weaning  ASSESSMENT / PLAN:  62 year old male with flu A and ARDS that is now trached.  On exam, coarse BS diffusely.  I reviewed CXR myself, trach is in good position.  Discussed with PCCM-NP.  Respiratory failure:  - Maintain on TC as tolerated  - SLP  Trach status:  - Change trach to a cuffless 6 XLT  - Likely downsize again on Monday, if passes swallow evaluation then cap x48 hours and decannulate if tolerated.  Hypoxemia:  - Titrate O2 for sat of 88-92%  Rush Farmer, M.D. [facility] Pulmonary/Critical Care Medicine. Pager:  [phone]. After hours pager: [phone].

## 2017-03-10 NOTE — Progress Notes (Signed)
  Speech Language Pathology Treatment: Dysphagia;Passy Muir Speaking valve  Patient Details Name: [name] MRN: [id number] DOB: 1955-12-20 Today's Date: 03/10/2017 Time: 8832-5498 SLP Time Calculation (min) (ACUTE ONLY): 27 min  Assessment / Plan / Recommendation Clinical Impression  Pt now has a cuffless trach, and he had PMV in place upon SLP arrival. Valve was checked with no evidence of back pressure. Vitals remained stable. His voice remains hoarse and moderately soft, but today he also seems to have air leaking more around his trach (question from the downsize). Pt needed Mod cues for verbal recall of swallowing precautions on previous date, but after review, he implemented them during PO trials of puree with Min cues for effortful swallows. One delayed cough was noted after intake had stopped. Would continue to allow bites of puree and sips of honey thick liquids with PMV in place. Use four, effortful swallows with EACH bolus to facilitate pharyngeal clearance, reduce aspiration risk, and work on Forensic scientist. Will continue to follow - hopeful for repeat FEES early next week.    HPI HPI: 62y/o male with a history of lymphoma in remission(last treated in 2012)who was admitted with ARDS due to influenza, andfound to have new severe systolic congestive heart failure with an EF of 25%.Pt was intubated on 2/9, trach placed at bedside on 2/24. #6 distal XLT placed on 2/24 due to cuff leak. Vent setting on 2/25: CPAP/PS FiO2 40%; PEEP 8; Pt has Cortrak.       SLP Plan  Continue with current plan of care       Recommendations  Diet recommendations: NPO;Other(comment)(except for bites of puree/sips of honey thick liquid with RN) Liquids provided via: Cup;Teaspoon;No straw Medication Administration: Via alternative means Compensations: Slow rate;Small sips/bites;Multiple dry swallows after each bite/sip(at least 4 swallows per bolus) Postural Changes and/or Swallow Maneuvers:  Seated upright 90 degrees;Upright 30-60 min after meal      Patient may use Passy-Muir Speech Valve: During all therapies with supervision;During all waking hours (remove during sleep) PMSV Supervision: Intermittent         Oral Care Recommendations: Oral care QID Follow up Recommendations: LTACH SLP Visit Diagnosis: Dysphagia, pharyngeal phase (R13.13) Plan: Continue with current plan of care       GO                [name] 03/10/2017, 12:01 PM  [name], M.A. CCC-SLP [phone]

## 2017-03-10 NOTE — Progress Notes (Signed)
PROGRESS NOTE    [name]  UXL:[id number] DOB: 1955/06/19 DOA: 02/16/2017 PCP: [name], MD   Brief Narrative:  [name] PMHx lymphoma in remission (last treated in 2012), HTN, dyslipidemia, gout  Admitted with ARDS due to influenza, and found to have new severe systolic congestive heart failure with an EF of 25%.     Subjective: 3/1  A/Ox 4, negative CP, negative abdominal pain, negative N/V. Positive acute SOB (trach just downsized to a #6 XLT cuffless).    Assessment & Plan:   Active Problems:   Respiratory failure, unspecified with hypoxia (HCC)   Acute respiratory failure (HCC)   Acute respiratory failure with hypoxemia (HCC)   Status post tracheostomy ([place])   Pressure injury of skin   Hypoxic  ARDS/influenza A pneumonia -S/P trach -Patient has been off vent since 2/26. -Aggressive pulmonary toilet -Titrate O2 to maintain SPO2> 93% -DuoNeb TID -Frequent tracheal suctioning PRN  Acute Systolic CHF -LVEF = 53% -Strict in and out since admission -2.7 L -Daily weight Filed Weights   03/07/17 0500 03/08/17 0500 03/09/17 0421  Weight: (!) 317 lb 7.4 oz (144 kg) (!) 316 lb 5.8 oz (143.5 kg) (!) 330 lb 11 oz (150 kg)  -Cardiology following -Viral cardiomyopathy? Coronary eval per cardiology. -Amlodipine 5 mg daily -ASA 81 mg daily -Coreg 25 mg BID -Lasix IV 60 mg BID -Losartan 50 mg daily  New onset atrial fibrillation -Rate controlled -Eliquis 5 mg BID  Acute pulmonary edema -See systolic CHF   Dysphagia  -Scheduled for  FEES 2/28  -Currently  CorTrak in place.  - Vital High Potein 21m/hr, pro-stat sugar free 64 TID   Hypomagnesemia -Magnesium goal> 2 -Magnesium 2 g   Hypokalemia -Potassium goal> 4 -Potassium 60 mEq   Non-Hodgkin's lymphoma -Port-A-Cath in LEFT chest -Per patient and wife in remission.  Bilateral foot wounds -Per wound care   DM Type 2   controlled with complication -2/7 Hemoglobin A1c = 6.3   Cellulitis on  back -Complete course of antibiotics. Using clindamycin secondary to patient's PCN allergy -Will need to monitor closely for C. difficile   Extreme DECONDITIONING  -Will require extensive rehabilitation. Unsure patient will qualify for CIR but will have patient evaluated. -Spoke at length with wife and patient today understands must participate with PT/OT or will not qualify for CIR or SNF    Anxiety/depression    Morbid obesity -Body mass index is 44.12 kg/m. -    DVT prophylaxis: Eliquis Code Status: Full Family Communication: Wife at bedside for discussion of plan care Disposition Plan:  CIR  Vs SNF?   [facility]    Procedures/Significant Events:  2/7 admit  2/8 TTE - EF 25-30% - diffuse hypokinesis - mild mitral regurg 2/9 intubated  2/14 tracheostomy  2/24 trach changed to #6 distal XLT due to cuff leak  3/1 trach change to #6 proximal XLT cuffless   I have personally reviewed and interpreted all radiology studies and my findings are as above.  VENTILATOR SETTINGS:    Cultures     Antimicrobials: Anti-infectives (From admission, onward)   Start     Stop   03/08/17 1200  clindamycin (CLEOCIN) capsule 300 mg     03/12/17 1759   03/07/17 1800  clindamycin (CLEOCIN) capsule 300 mg  Status:  Discontinued     03/08/17 1003   02/20/17 1400  aztreonam (AZACTAM) 1 g in sodium chloride 0.9 % 100 mL IVPB  Status:  Discontinued  02/21/17 1020   02/19/17 0800  vancomycin (VANCOCIN) 1,500 mg in sodium chloride 0.9 % 500 mL IVPB  Status:  Discontinued     02/21/17 1020   02/18/17 2200  oseltamivir (TAMIFLU) 6 MG/ML suspension 75 mg     02/21/17 2112   02/17/17 1000  oseltamivir (TAMIFLU) capsule 75 mg  Status:  Discontinued     02/18/17 1412   02/17/17 0600  vancomycin (VANCOCIN) 1,250 mg in sodium chloride 0.9 % 250 mL IVPB  Status:  Discontinued     02/19/17 0720   02/17/17 0200  aztreonam (AZACTAM) 1 g in dextrose 5 % 50 mL IVPB   Status:  Discontinued     02/20/17 0844   02/16/17 1845  aztreonam (AZACTAM) 2 g in dextrose 5 % 50 mL IVPB     02/16/17 1957   02/16/17 1830  vancomycin (VANCOCIN) 2,500 mg in sodium chloride 0.9 % 500 mL IVPB     02/16/17 2320   02/16/17 1745  levofloxacin (LEVAQUIN) IVPB 750 mg     02/16/17 1957       Devices    LINES / TUBES:  Trach change to #6 proximal XLT cuffless>>>     Continuous Infusions: . feeding supplement (VITAL HIGH PROTEIN) 1,000 mL (03/10/17 0600)     Objective: Vitals:   03/10/17 0600 03/10/17 0700 03/10/17 0734 03/10/17 0743  BP: 115/83 (!) 128/96 (!) 128/96   Pulse: 86 86 84   Resp: (!) 23 (!) 24 (!) 22   Temp:    98 F (36.7 C)  TempSrc:    Oral  SpO2: 94% 92% 92%   Weight:      Height:        Intake/Output Summary (Last 24 hours) at 03/10/2017 0824 Last data filed at 03/10/2017 0600 Gross per 24 hour  Intake 1100 ml  Output 900 ml  Net 200 ml   Filed Weights   03/07/17 0500 03/08/17 0500 03/09/17 0421  Weight: (!) 317 lb 7.4 oz (144 kg) (!) 316 lb 5.8 oz (143.5 kg) (!) 330 lb 11 oz (150 kg)    Physical Exam:  General: A/O 4, positive No acute respiratory distress (now on trach collar) Neck:  Negative scars, masses, torticollis, lymphadenopathy, JVD, #6 cuffless trach now in place Lungs: tachypnea coarse breath sounds, Clear to auscultation bilaterally without wheezes or crackles Cardiovascular: Tachycardic, Regular rhythm without murmur gallop or rub normal S1 and S2 Abdomen: MORBIDLY obese, negative abdominal pain, nondistended, positive soft, bowel sounds, no rebound, no ascites, no appreciable mass Extremities: No significant cyanosis, clubbing. 2+ pitting edema bilateral lower extremity improving. Skin: Negative rashes, lesions, ulcers Psychiatric:  Negative depression, negative anxiety, negative fatigue, negative mania  Central nervous system:  Cranial nerves II through XII intact, tongue/uvula midline, all extremities muscle  strength 5/5, sensation intact throughout, negative dysarthria, negative expressive aphasia, negative receptive aphasia     Data Reviewed: Care during the described time interval was provided by me .  I have reviewed this patient's available data, including medical history, events of note, physical examination, and all test results as part of my evaluation.   CBC: Recent Labs  Lab 03/04/17 0442 03/05/17 0540 03/06/17 0606 03/07/17 0436  WBC 10.9* 12.2* 11.2* 9.9  HGB 13.1 13.2 11.7* 11.6*  HCT 41.1 41.2 37.4* 36.3*  MCV 93.8 93.2 94.2 93.6  PLT 201 242 216 287   Basic Metabolic Panel: Recent Labs  Lab 03/05/17 0540 03/06/17 0630 03/07/17 0436 03/08/17 0459 03/09/17 0359 03/09/17 1436  03/10/17 0430  NA 139 138 137 135 136  --  139  K 3.7 3.4* 3.9 4.0 3.1* 3.4* 3.2*  CL 100* 100* 100* 100* 102  --  101  CO2 '29 29 28 27 25  '$ --  27  GLUCOSE 111* 123* 114* 100* 129*  --  131*  BUN 31* 30* 30* 22* 24*  --  27*  CREATININE 0.55* 0.57* 0.57* 0.58* 0.56*  --  0.60*  CALCIUM 8.2* 8.0* 8.0* 8.0* 7.5*  --  8.3*  MG 1.9 1.7 1.9  --   --  1.7 1.8   GFR: Estimated Creatinine Clearance: 144.3 mL/min (A) (by C-G formula based on SCr of 0.6 mg/dL (L)). Liver Function Tests: Recent Labs  Lab 03/07/17 0436  AST 33  ALT 42  ALKPHOS 98  BILITOT 2.0*  PROT 5.6*  ALBUMIN 1.9*   No results for input(s): LIPASE, AMYLASE in the last 168 hours. No results for input(s): AMMONIA in the last 168 hours. Coagulation Profile: No results for input(s): INR, PROTIME in the last 168 hours. Cardiac Enzymes: No results for input(s): CKTOTAL, CKMB, CKMBINDEX, TROPONINI in the last 168 hours. BNP (last 3 results) No results for input(s): PROBNP in the last 8760 hours. HbA1C: No results for input(s): HGBA1C in the last 72 hours. CBG: Recent Labs  Lab 03/09/17 1207 03/09/17 1548 03/09/17 1954 03/09/17 2345 03/10/17 0331  GLUCAP 114* 125* 105* 113* 110*   Lipid Profile: No results for  input(s): CHOL, HDL, LDLCALC, TRIG, CHOLHDL, LDLDIRECT in the last 72 hours. Thyroid Function Tests: No results for input(s): TSH, T4TOTAL, FREET4, T3FREE, THYROIDAB in the last 72 hours. Anemia Panel: Recent Labs    03/10/17 0430  VITAMINB12 709  FOLATE 18.3  FERRITIN 1,624*  TIBC 136*  IRON 23*  RETICCTPCT 1.8   Urine analysis: No results found for: COLORURINE, APPEARANCEUR, LABSPEC, PHURINE, GLUCOSEU, HGBUR, BILIRUBINUR, KETONESUR, PROTEINUR, UROBILINOGEN, NITRITE, LEUKOCYTESUR Sepsis Labs: '@LABRCNTIP'$ (procalcitonin:4,lacticidven:4)  )No results found for this or any previous visit (from the past 240 hour(s)).       Radiology Studies: No results found.      Scheduled Meds: . allopurinol  300 mg Per Tube Daily  . amLODipine  5 mg Per Tube Daily  . apixaban  5 mg Per Tube BID  . aspirin  81 mg Per Tube Daily  . atorvastatin  20 mg Per Tube q1800  . carvedilol  25 mg Per Tube BID WC  . chlorhexidine gluconate (MEDLINE KIT)  15 mL Mouth Rinse BID  . Chlorhexidine Gluconate Cloth  6 each Topical Daily  . clindamycin  300 mg Per Tube Q6H  . febuxostat  40 mg Per Tube Daily  . feeding supplement (PRO-STAT SUGAR FREE 64)  60 mL Per Tube TID  . furosemide  60 mg Intravenous Q12H  . insulin aspart  0-15 Units Subcutaneous Q4H  . ipratropium-albuterol  3 mL Nebulization TID  . losartan  50 mg Per Tube Daily  . mouth rinse  15 mL Mouth Rinse QID  . Melatonin  6 mg Per Tube QHS  . mupirocin cream   Topical Daily  . pantoprazole sodium  40 mg Per Tube Q1200  . potassium chloride  30 mEq Per Tube Q4H  . sertraline  50 mg Per Tube Daily  . sodium chloride flush  10-40 mL Intracatheter Q12H   Continuous Infusions: . feeding supplement (VITAL HIGH PROTEIN) 1,000 mL (03/10/17 0600)     LOS: 22 days    Time spent: 40  minutes    WOODS, Geraldo Docker, MD [facility] Pager [phone]   If 7PM-7AM, please contact night-coverage [email or web address] Password  [facility] 03/10/2017, 8:24 AM

## 2017-03-10 NOTE — Progress Notes (Signed)
[facility] ADULT ICU REPLACEMENT PROTOCOL FOR AM LAB REPLACEMENT ONLY  The patient does apply for the [facility] Adult ICU Electrolyte Replacment Protocol based on the criteria listed below:   1. Is GFR >/= 40 ml/min? Yes.    Patient's GFR today is >60 2. Is urine output >/= 0.5 ml/kg/hr for the last 6 hours? Yes.   Patient's UOP is .55 ml/kg/hr 3. Is BUN < 60 mg/dL? Yes.    Patient's BUN today is 27 4. Abnormal electrolyte(s): K was 3.2 5. Ordered repletion with: per protocol 6. If a panic level lab has been reported, has the CCM MD in charge been notified? Yes.  .   Physician:  Dr. Harlene Ramus, Philis Nettle 03/10/2017 5:40 AM

## 2017-03-10 NOTE — Procedures (Signed)
First Trach Change  Cuffed 6 XLT changed over a changer to a cuffless 6 XLT with good color change and BS bilaterally  Rush Farmer, M.D. [facility] Pulmonary/Critical Care Medicine. Pager: [phone]. After hours pager: [phone].

## 2017-03-10 NOTE — Progress Notes (Addendum)
CSW made aware that pt has been on trach collar at 40 percent since the 26th of February. CSW spoke with Noreene Larsson from [facility] to update her on pt's needs regarding vent/snf at this time. CSW was informed that Noreene Larsson still has a facility that can take pt [facility]) with just trach collar. CSW informed her that at this time pt is still a little to high on trach collar for a facility (pt needs to be at 28 percent for trach collar and is still on 40 percent). CSW will continue to follow for trach snf needs at this time.    Virgie Dad Trishelle Devora, MSW, [place] [facility] Clinical Social Worker [phone]

## 2017-03-10 NOTE — Progress Notes (Signed)
Pt trach downsized from #6 XLT to #5 Cuffless XLT by CCM MD. Positive color change noted on etco2, bilateral breath sounds, no blood loss noted. Pt stable throughout with no complications. Spo2 decreased to 85% but rapidly improved to 90% upon head of bed being returned to 30 degrees.  Pt denies SOB, no increased WOB. RT will continue to monitor.

## 2017-03-10 NOTE — Consult Note (Addendum)
[facility] Nurse wound re-consult note Reason for Consult: Requested to assess sacrum and buttocks.  Refer to previous [place] consult notes on 2/18 and 2/26 Wound type: Pt was previously noted to have a 8X8cm dark reddish purple area to buttocks and patchy areas of moisture associated skin damage and peeling skin and candiditas. The moisture associated skin damage is improved, but sacrum has evolved into an unstageable pressure injury; 1.8X.2cm, 100% yellow slough, mod amt tan drainage, no odor.  There are 2 areas of full thickness wounds to right buttocks where skin has peeled; 1X1X.2cm, red and moist, and .3X.5X.1cm, 50% darker colored red, 50% bright red, small amt bloody drainage.  Pressure Injury POA: No Dressing procedure/placement/frequency: Pt has a high BMI and has multiple systemic factors which can impair healing.  He has been on a Size[place] low airloss bed since admission to reduce pressure.  Discussed plan of care with patient and wife at the bedside. Begin Santyl ointment to provide enzymatic debridement of nonviable tissue and foam dressing to protect buttocks from further injury and absorb moisture. Please re-consult if further assistance is needed.  Thank-you,  Julien Girt MSN, [place], [place], [place], [place]

## 2017-03-10 NOTE — Progress Notes (Signed)
Report called to 2W 10 RN, tx w/ vss w/ Swot RN, recvd by RNs and report confirmed at bs

## 2017-03-10 NOTE — NC FL2 (Addendum)
[place] MEDICAID FL2 LEVEL OF CARE SCREENING TOOL     IDENTIFICATION  Patient Name: [name] Birthdate: August 19, 1955 Sex: male Admission Date (Current Location): 02/16/2017  [facility] [place] and [place] Number:  Herbalist and Address:  [facility] [place]. [facility], [place] [address], [place], [place] [address]      Provider Number: [id number]  Attending Physician Name and Address:  Allie Bossier, MD  Relative Name and Phone Number:       Current Level of Care: Hospital Recommended Level of Care: Other (Comment)(trach snf.) Prior Approval Number:    Date Approved/Denied:   PASRR Number:    [id number]  Discharge Plan: SNF    Current Diagnoses: Patient Active Problem List   Diagnosis Date Noted  . Hypoxic   . Pressure injury of skin 02/28/2017  . Acute respiratory failure with hypoxemia ([place])   . Status post tracheostomy ([place])   . Acute respiratory failure ([place])   . Respiratory failure, unspecified with hypoxia ([place]) 02/16/2017  . Follicular lymphoma grade II of intrathoracic lymph nodes ([place]) 05/02/2016  . Portacath in place 05/04/2015  . Gout attack 01/16/2012  . Follicular lymphoma grade II ([place]) 12/16/2010    Orientation RESPIRATION BLADDER Height & Weight     Self, Situation, Place  Tracheostomy Continent Weight: (!) 330 lb 11 oz (150 kg) Height:  '5\' 11"'$  (180.3 cm)  BEHAVIORAL SYMPTOMS/MOOD NEUROLOGICAL BOWEL NUTRITION STATUS      Incontinent Feeding tube(please see discharhe summary for any changes. )  AMBULATORY STATUS COMMUNICATION OF NEEDS Skin   Extensive Assist Verbally Surgical wounds(trach placement. )                       Personal Care Assistance Level of Assistance  Bathing, Feeding, Dressing Bathing Assistance: Maximum assistance Feeding assistance: Maximum assistance Dressing Assistance: Maximum assistance     Functional Limitations Info  Sight, Hearing, Speech Sight Info: Adequate Hearing Info: Adequate Speech Info:  Impaired(pt has a trach)    SPECIAL CARE FACTORS FREQUENCY  PT (By licensed PT), OT (By licensed OT), Speech therapy     PT Frequency: 5 times a week  OT Frequency: 5 times a week      Speech Therapy Frequency: 5 times a week       Contractures Contractures Info: Not present    Additional Factors Info  Code Status, Allergies Code Status Info: Full  Allergies Info: Penicillins           Current Medications (03/10/2017):  This is the current hospital active medication list Current Facility-Administered Medications  Medication Dose Route Frequency Provider Last Rate Last Dose  . acetaminophen (TYLENOL) solution 650 mg  650 mg Per Tube Q6H PRN Yopp, Amber C, RPH   650 mg at 03/09/17 0600  . allopurinol (ZYLOPRIM) tablet 300 mg  300 mg Per Tube Daily Cherene Altes, MD   300 mg at 03/09/17 1015  . amLODipine (NORVASC) tablet 5 mg  5 mg Per Tube Daily Simonne Maffucci B, MD   5 mg at 03/09/17 1019  . apixaban (ELIQUIS) tablet 5 mg  5 mg Per Tube BID Simonne Maffucci B, MD   5 mg at 03/09/17 2119  . aspirin chewable tablet 81 mg  81 mg Per Tube Daily Erick Colace, NP   81 mg at 03/09/17 1019  . atorvastatin (LIPITOR) tablet 20 mg  20 mg Per Tube q1800 Erick Colace, NP   20 mg at 03/09/17  1741  . butalbital-acetaminophen-caffeine (FIORICET, ESGIC) 50-325-40 MG per tablet 1-2 tablet  1-2 tablet Per Tube Q6H PRN Cherene Altes, MD   1 tablet at 03/08/17 2318  . carvedilol (COREG) tablet 25 mg  25 mg Per Tube BID WC Tommie Raymond, NP   Stopped at 03/09/17 1638  . chlorhexidine gluconate (MEDLINE KIT) (PERIDEX) 0.12 % solution 15 mL  15 mL Mouth Rinse BID Rush Farmer, MD   15 mL at 03/09/17 2041  . Chlorhexidine Gluconate Cloth 2 % PADS 6 each  6 each Topical Daily Cherene Altes, MD   6 each at 03/09/17 1640  . clindamycin (CLEOCIN) capsule 300 mg  300 mg Per Tube Q6H Cherene Altes, MD   300 mg at 03/10/17 0555  . febuxostat (ULORIC) tablet 40 mg  40 mg Per  Tube Daily Cherene Altes, MD   40 mg at 03/09/17 1013  . feeding supplement (PRO-STAT SUGAR FREE 64) liquid 60 mL  60 mL Per Tube TID Collene Gobble, MD   60 mL at 03/09/17 2109  . feeding supplement (VITAL HIGH PROTEIN) liquid 1,000 mL  1,000 mL Per Tube Continuous Collene Gobble, MD 50 mL/hr at 03/10/17 0800 1,000 mL at 03/10/17 0800  . furosemide (LASIX) injection 60 mg  60 mg Intravenous Q12H Cherene Altes, MD   60 mg at 03/09/17 2303  . hydrALAZINE (APRESOLINE) injection 10-40 mg  10-40 mg Intravenous Q4H PRN Rigoberto Noel, MD      . ibuprofen (ADVIL,MOTRIN) 100 MG/5ML suspension 600 mg  600 mg Per Tube Q6H PRN Cherene Altes, MD   600 mg at 03/09/17 2109  . insulin aspart (novoLOG) injection 0-15 Units  0-15 Units Subcutaneous Q4H Collene Gobble, MD   2 Units at 03/09/17 1649  . ipratropium-albuterol (DUONEB) 0.5-2.5 (3) MG/3ML nebulizer solution 3 mL  3 mL Nebulization Q4H PRN Jennelle Human B, NP      . ipratropium-albuterol (DUONEB) 0.5-2.5 (3) MG/3ML nebulizer solution 3 mL  3 mL Nebulization TID Kloefkorn, [place], MD   3 mL at 03/10/17 0732  . LORazepam (ATIVAN) 2 MG/ML concentrated solution 0.5-1 mg  0.5-1 mg Per Tube Q4H PRN Cherene Altes, MD      . losartan (COZAAR) tablet 50 mg  50 mg Per Tube Daily Minus Breeding, MD   50 mg at 03/09/17 1018  . magnesium sulfate IVPB 2 g 50 mL  2 g Intravenous Once Allie Bossier, MD      . MEDLINE mouth rinse  15 mL Mouth Rinse QID Rush Farmer, MD   15 mL at 03/10/17 0502  . Melatonin TABS 6 mg  6 mg Per Tube QHS Cherene Altes, MD   6 mg at 03/09/17 2119  . morphine 2 MG/ML injection 2 mg  2 mg Intravenous Q3H PRN Schorr, Rhetta Mura, NP      . mupirocin cream (BACTROBAN) 2 %   Topical Daily Hammonds, Sharyn Blitz, MD      . ondansetron [facility]) injection 4 mg  4 mg Intravenous Q6H PRN Mauri Brooklyn, MD   4 mg at 02/18/17 5916  . oxyCODONE (ROXICODONE) 5 MG/5ML solution 5-7.5 mg  5-7.5 mg Per Tube Q3H PRN Cherene Altes, MD   5 mg at 03/09/17 0729  . pantoprazole sodium (PROTONIX) 40 mg/20 mL oral suspension 40 mg  40 mg Per Tube Q1200 Erick Colace, NP   40 mg at 03/09/17 1247  .  potassium chloride 20 MEQ/15ML (10%) solution 30 mEq  30 mEq Per Tube Q4H Deterding, Guadelupe Sabin, MD   30 mEq at 03/10/17 0554  . RESOURCE THICKENUP CLEAR   Oral PRN Allie Bossier, MD      . sertraline (ZOLOFT) 20 MG/ML concentrated solution 50 mg  50 mg Per Tube Daily Cherene Altes, MD   50 mg at 03/09/17 1020  . sodium chloride flush (NS) 0.9 % injection 10-40 mL  10-40 mL Intracatheter Q12H Juanito Doom, MD   20 mL at 03/09/17 2124   Facility-Administered Medications Ordered in Other Encounters  Medication Dose Route Frequency Provider Last Rate Last Dose  . sodium chloride 0.9 % injection 10 mL  10 mL Intravenous PRN Curt Bears, MD   10 mL at 05/04/15 1529     Discharge Medications: Please see discharge summary for a list of discharge medications.  Relevant Imaging Results:  Relevant Lab Results:   Additional Information SSN- [id number]. Pt had trach placed on 2/24. It was changed on 03/10/17 to 67m Distal, Uncuffed.     KWetzel Bjornstad LCSWA

## 2017-03-10 NOTE — Progress Notes (Signed)
Occupational Therapy Treatment Patient Details Name: [name] MRN: [id number] DOB: [birth year] Today's Date: 03/10/2017    History of present illness Pt is a 62 y/o male history of lymphoma in remission admitted with acute on chronic hypoxemic and hypercapnic respiratory failure due to influenza A.  He was also noted to have a new LVEF of 25% with contribution of cardiogenic edema.  He failed extubation on 2/14.  Based on his need for continued ventilatory support he underwent percutaneous tracheostomy on 2/14.   OT comments  Pt with anxiety in standing with attempt to pivot to chair from Vital Go bed in upright position. Squat-pivot to chair with +2 max assist. Performed seated shoulder exercises and instructed to continue between therapy sessions.  Follow Up Recommendations  LTACH    Equipment Recommendations       Recommendations for Other Services      Precautions / Restrictions Precautions Precautions: Fall Precaution Comments: trach, cortrak       Mobility Bed Mobility Overal bed mobility: Needs Assistance Bed Mobility: Supine to Sit;Sit to Supine     Supine to sit: +2 for physical assistance;Mod assist     General bed mobility comments: assist for LEs to EOB and to raise trunk, pt able to position his hips at EOB with supervision  Transfers Overall transfer level: Needs assistance   Transfers: Squat Pivot Transfers     Squat pivot transfers: +2 physical assistance;Max assist     General transfer comment: inclined without progression with Vital Go bed to 83 degrees with pt able to perform squats with support of RW and gradual removal of straps, pt with anxiety, not able to take steps for transfer to chair     Balance Overall balance assessment: Needs assistance   Sitting balance-Leahy Scale: Fair Sitting balance - Comments: statically     Standing balance-Leahy Scale: Poor Standing balance comment: pt able to stand with assist of Vital Go bed and walker  for support with gradual reduction in number of straps                           ADL either performed or assessed with clinical judgement   ADL                                               Vision       Perception     Praxis      Cognition Arousal/Alertness: Awake/alert Behavior During Therapy: Anxious Overall Cognitive Status: Difficult to assess                                 General Comments: follows commands, some tangential comments, pt with low volume, difficult to understand at times        Exercises Other Exercises Other Exercises: R shoulder flexion x 10 with 2# weight, AAROM with bar--L UE.   Shoulder Instructions       General Comments      Pertinent Vitals/ Pain       Pain Assessment: No/denies pain Faces Pain Scale: No hurt  Home Living  Prior Functioning/Environment              Frequency  Min 2X/week        Progress Toward Goals  OT Goals(current goals can now be found in the care plan section)  Progress towards OT goals: Progressing toward goals  Acute Rehab OT Goals Patient Stated Goal: wanting to work on anything that will make him stronger OT Goal Formulation: With patient Time For Goal Achievement: 04/14/17 Potential to Achieve Goals: Good  Plan Discharge plan remains appropriate    Co-evaluation    PT/OT/SLP Co-Evaluation/Treatment: Yes Reason for Co-Treatment: Complexity of the patient's impairments (multi-system involvement);For patient/therapist safety   OT goals addressed during session: Strengthening/ROM      AM-PAC PT "6 Clicks" Daily Activity     Outcome Measure   Help from another person eating meals?: Total Help from another person taking care of personal grooming?: A Lot Help from another person toileting, which includes using toliet, bedpan, or urinal?: Total Help from another person bathing  (including washing, rinsing, drying)?: A Lot Help from another person to put on and taking off regular upper body clothing?: A Little Help from another person to put on and taking off regular lower body clothing?: Total 6 Click Score: 10    End of Session Equipment Utilized During Treatment: Gait belt;Rolling walker  OT Visit Diagnosis: Other abnormalities of gait and mobility (R26.89);Muscle weakness (generalized) (M62.81)   Activity Tolerance Patient limited by pain   Patient Left in chair;with call bell/phone within reach;with family/visitor present   Nurse Communication Mobility status;Need for lift equipment        Time: 1308-1410 OT Time Calculation (min): 62 min  Charges: OT General Charges $OT Visit: 1 Visit OT Treatments $Therapeutic Activity: 8-22 mins $Therapeutic Exercise: 8-22 mins  03/10/2017 Nestor Lewandowsky, OTR/L Pager: [phone] Yee Joss, [name] 03/10/2017, 2:32 PM

## 2017-03-11 LAB — BASIC METABOLIC PANEL
ANION GAP: 15 (ref 5–15)
BUN: 28 mg/dL — ABNORMAL HIGH (ref 6–20)
CHLORIDE: 100 mmol/L — AB (ref 101–111)
CO2: 27 mmol/L (ref 22–32)
Calcium: 8.5 mg/dL — ABNORMAL LOW (ref 8.9–10.3)
Creatinine, Ser: 0.62 mg/dL (ref 0.61–1.24)
GFR calc non Af Amer: >60 mL/min (ref >60)
GLUCOSE: 76 mg/dL (ref 65–99)
Potassium: 3.2 mmol/L — ABNORMAL LOW (ref 3.5–5.1)
Sodium: 142 mmol/L (ref 135–145)

## 2017-03-11 LAB — MAGNESIUM: Magnesium: 2.1 mg/dL (ref 1.7–2.4)

## 2017-03-11 LAB — GLUCOSE, CAPILLARY
GLUCOSE-CAPILLARY: 114 mg/dL — AB (ref 65–99)
GLUCOSE-CAPILLARY: 131 mg/dL — AB (ref 65–99)
GLUCOSE-CAPILLARY: 102 mg/dL — AB (ref 65–99)
GLUCOSE-CAPILLARY: 110 mg/dL — AB (ref 65–99)

## 2017-03-11 MED ORDER — FUROSEMIDE 10 MG/ML IJ SOLN
80.0000 mg | Freq: Two times a day (BID) | INTRAMUSCULAR | Status: DC
  Administered 2017-03-11 – 2017-03-14 (×7): 80 mg via INTRAVENOUS
  Filled 2017-03-11 (×7): qty 8

## 2017-03-11 MED ORDER — POTASSIUM CHLORIDE 20 MEQ/15ML (10%) PO SOLN
40.0000 meq | Freq: Two times a day (BID) | ORAL | Status: DC
  Filled 2017-03-11: qty 30

## 2017-03-11 MED ORDER — POTASSIUM CHLORIDE 20 MEQ/15ML (10%) PO SOLN
40.0000 meq | Freq: Every day | ORAL | Status: DC
  Administered 2017-03-11 – 2017-03-13 (×3): 40 meq
  Filled 2017-03-11 (×2): qty 30

## 2017-03-11 NOTE — Progress Notes (Signed)
[name] TEAM 1 - Stepdown/ICU TEAM  CLEARNCE LEJA  OEV:[id number] DOB: 08-09-1955 DOA: 02/16/2017 PCP: [name], MD    Brief Narrative:  62y/o male with a history of lymphoma in remission (last treated in 2012) who was admitted with ARDS due to influenza, and found to have new severe systolic congestive heart failure with an EF of 25%.    Significant Events: 2/7 admit  2/8 TTE - EF 25-30% - diffuse hypokinesis - mild mitral regurg 2/9 intubated  2/14 tracheostomy  2/24 trach changed to #6 distal XLT due to cuff leak  3/1 trach downsized to #5 cuffless XLT  Subjective: Alert and conversant w/ PMV in place.  No distress.  Denies cp, n/v, sob, or abdom pain.  Is anxious to be allowed to eat.     Assessment & Plan:  ARDS due to Influenza A   s/p trach - trach and vent care has been provided by PCCM - now off vent since 2/26 - plan to downsize trach 3/4 and if tolerates will cap x48hrs toward goal of decannulation   Acute pulmonary edema due to Acute systolic CHF (EF 82%) Cardiology has evaluated - unclear duration/new diagnosis - ?viral CM - coronary eval as per Cards - net negative ~4700 since admit, down from ~700 48hrs agao - cont BB, ARB - increase lasix dose again to improve negative balance   New onset persistent Atrial fibrillation   On eliquis - rate controlled - BP stable   Dysphagia  Cont to work w/ SLP - cont Cortrak for now - is presently allowed some limited modified intake w/ RN assist   Hypomagnesemia Corrected   Hypokalemia supplement w/ ongoing use of diuretic   History of Non-Hodgkins lymphoma Has porta-cath in L chest   Bilateral foot wounds Wound care  DM2   CBG currently well controlled   Dermatitis on back v/s cellulitis  Cont abx coverage for planned 7 days of tx   Deconditioning Reports that he is motivated to participate in rehab   Morbid obesity - Body mass index is 45.57 kg/m.   DVT prophylaxis: Eliquis  Code Status: FULL  CODE Family Communication: spoke w/ sister at bedside  Disposition Plan: hopeful for CIR but if not, will need SNF for rehab  Consultants:  PCCM Cardiology   Antimicrobials:  Levaquin 2/3 >2/5 + 2/7  Vancomycin 2/7 > 2/12 Azactam 2/7 >2/12 Clindamycin 2/26 >  Objective: Blood pressure 127/90, pulse 93, temperature 97.9 F (36.6 C), temperature source Oral, resp. rate 20, height 5' 11"  (1.803 m), weight (!) 148.2 kg (326 lb 11.6 oz), SpO2 93 %.  Intake/Output Summary (Last 24 hours) at 03/11/2017 0828 Last data filed at 03/11/2017 0300 Gross per 24 hour  Intake 670 ml  Output 1350 ml  Net -680 ml   Filed Weights   03/08/17 0500 03/09/17 0421 03/11/17 0328  Weight: (!) 143.5 kg (316 lb 5.8 oz) (!) 150 kg (330 lb 11 oz) (!) 148.2 kg (326 lb 11.6 oz)    Examination: General: No acute respiratory distress - alert - speaking w/ PMV in place  Lungs: Clear to auscultation bilaterally - distant breath sounds th/o  Cardiovascular: irreg irreg - rate controlled  Abdomen: NT/ND, morbidly obese, soft, bowel sounds positive Extremities: 1+ B LE edema  CBC: Recent Labs  Lab 03/05/17 0540 03/06/17 0606 03/07/17 0436  WBC 12.2* 11.2* 9.9  HGB 13.2 11.7* 11.6*  HCT 41.2 37.4* 36.3*  MCV 93.2 94.2 93.6  PLT 242 216  754   Basic Metabolic Panel: Recent Labs  Lab 03/05/17 0540 03/06/17 0630 03/07/17 0436 03/08/17 0459 03/09/17 0359 03/09/17 1436 03/10/17 0430  NA 139 138 137 135 136  --  139  K 3.7 3.4* 3.9 4.0 3.1* 3.4* 3.2*  CL 100* 100* 100* 100* 102  --  101  CO2 29 29 28 27 25   --  27  GLUCOSE 111* 123* 114* 100* 129*  --  131*  BUN 31* 30* 30* 22* 24*  --  27*  CREATININE 0.55* 0.57* 0.57* 0.58* 0.56*  --  0.60*  CALCIUM 8.2* 8.0* 8.0* 8.0* 7.5*  --  8.3*  MG 1.9 1.7 1.9  --   --  1.7 1.8   GFR: Estimated Creatinine Clearance: 143.3 mL/min (A) (by C-G formula based on SCr of 0.6 mg/dL (L)).  Liver Function Tests: Recent Labs  Lab 03/07/17 0436  AST 33  ALT 42   ALKPHOS 98  BILITOT 2.0*  PROT 5.6*  ALBUMIN 1.9*    HbA1C: Hgb A1c MFr Bld  Date/Time Value Ref Range Status  02/16/2017 11:00 PM 6.3 (H) 4.8 - 5.6 % Final    Comment:    (NOTE) Pre diabetes:          5.7%-6.4% Diabetes:              >6.4% Glycemic control for   <7.0% adults with diabetes     CBG: Recent Labs  Lab 03/10/17 1129 03/10/17 1935 03/10/17 2329 03/11/17 0401 03/11/17 0726  GLUCAP 126* 107* 124* 114* 131*     Scheduled Meds: . allopurinol  300 mg Per Tube Daily  . amLODipine  5 mg Per Tube Daily  . apixaban  5 mg Per Tube BID  . aspirin  81 mg Per Tube Daily  . atorvastatin  20 mg Per Tube q1800  . carvedilol  25 mg Per Tube BID WC  . chlorhexidine gluconate (MEDLINE KIT)  15 mL Mouth Rinse BID  . Chlorhexidine Gluconate Cloth  6 each Topical Daily  . clindamycin  300 mg Per Tube Q6H  . collagenase   Topical Daily  . febuxostat  40 mg Per Tube Daily  . feeding supplement (PRO-STAT SUGAR FREE 64)  60 mL Per Tube TID  . furosemide  60 mg Intravenous Q12H  . insulin aspart  0-15 Units Subcutaneous Q4H  . ipratropium-albuterol  3 mL Nebulization TID  . losartan  50 mg Per Tube Daily  . mouth rinse  15 mL Mouth Rinse QID  . Melatonin  6 mg Per Tube QHS  . mupirocin cream   Topical Daily  . pantoprazole sodium  40 mg Per Tube Q1200  . sertraline  50 mg Per Tube Daily  . sodium chloride flush  10-40 mL Intracatheter Q12H     LOS: 23 days   Cherene Altes, MD [facility] Office  [phone] Pager - Text Page per Amion as per below:  On-Call/Text Page:      [email or web address]      password [email or web address]  If 7PM-7AM, please contact night-coverage [email or web address] Password [email or web address] 03/11/2017, 8:28 AM

## 2017-03-12 LAB — BASIC METABOLIC PANEL
Anion gap: 12 (ref 5–15)
BUN: 34 mg/dL — ABNORMAL HIGH (ref 6–20)
CHLORIDE: 100 mmol/L — AB (ref 101–111)
CO2: 28 mmol/L (ref 22–32)
CREATININE: 0.61 mg/dL (ref 0.61–1.24)
Calcium: 8.3 mg/dL — ABNORMAL LOW (ref 8.9–10.3)
GFR calc non Af Amer: >60 mL/min (ref >60)
Glucose, Bld: 111 mg/dL — ABNORMAL HIGH (ref 65–99)
POTASSIUM: 3.3 mmol/L — AB (ref 3.5–5.1)
SODIUM: 140 mmol/L (ref 135–145)

## 2017-03-12 LAB — CBC
HEMATOCRIT: 37.5 % — AB (ref 39.0–52.0)
Hemoglobin: 11.9 g/dL — ABNORMAL LOW (ref 13.0–17.0)
MCH: 29.7 pg (ref 26.0–34.0)
MCHC: 31.7 g/dL (ref 30.0–36.0)
MCV: 93.5 fL (ref 78.0–100.0)
PLATELETS: 284 10*3/uL (ref 150–400)
RBC: 4.01 MIL/uL — ABNORMAL LOW (ref 4.22–5.81)
RDW: 16.7 % — AB (ref 11.5–15.5)
WBC: 11.1 10*3/uL — AB (ref 4.0–10.5)

## 2017-03-12 LAB — GLUCOSE, CAPILLARY
GLUCOSE-CAPILLARY: 117 mg/dL — AB (ref 65–99)
Glucose-Capillary: 95 mg/dL (ref 65–99)
Glucose-Capillary: 125 mg/dL — ABNORMAL HIGH (ref 65–99)

## 2017-03-12 LAB — MAGNESIUM: MAGNESIUM: 2 mg/dL (ref 1.7–2.4)

## 2017-03-12 MED ORDER — LOPERAMIDE HCL 1 MG/5ML PO LIQD
2.0000 mg | ORAL | Status: DC | PRN
  Administered 2017-03-13: 2 mg
  Filled 2017-03-12 (×2): qty 10

## 2017-03-12 NOTE — Progress Notes (Signed)
[place] TEAM 1 - Stepdown/ICU TEAM  [name]  NOM:[id number] DOB: 1955-09-15 DOA: 02/16/2017 PCP: [name], MD    Brief Narrative:  62y/o male with a history of lymphoma in remission (last treated in 2012) who was admitted with ARDS due to influenza, and found to have new severe systolic congestive heart failure with an EF of 25%.    Significant Events: 2/7 admit  2/8 TTE - EF 25-30% - diffuse hypokinesis - mild mitral regurg 2/9 intubated  2/14 tracheostomy  2/24 trach changed to #6 distal XLT due to cuff leak  3/1 trach downsized to #5 cuffless XLT  Subjective: Alert and speaking w/ PMV in place.  Tolerating applesauce w/o any difficulty.  Denies cp, sob, n/v, or abdom pain.  Reports that he is very uncomfortable in his specialty bed, and asks for it to be changed.  Has had frequent loose stools.    Assessment & Plan:  ARDS due to Influenza A   s/p trach - trach and vent care has been provided by PCCM - now off vent since 2/26 - plan to downsize trach 3/4 and if tolerates will cap x48hrs toward goal of decannulation   Acute pulmonary edema due to Acute systolic CHF (EF 96%) Cardiology has evaluated - unclear duration/new diagnosis - ?viral CM - coronary eval as per Cards - net negative ~11,000 since admit  - cont BB, ARB - cont diuretic   New onset persistent Atrial fibrillation   On eliquis - rate controlled - BP stable   Dysphagia  Cont to work w/ SLP - cont Cortrak for now - is presently allowed some limited modified intake w/ RN assist, which he is tolerating well - anticipate SLP re-eval tomorrow   Hypomagnesemia Corrected   Hypokalemia Cont to supplement w/ ongoing use of diuretic   History of Non-Hodgkins lymphoma Has porta-cath in L chest   Bilateral foot wounds Wound care  DM2   CBG well controlled   Dermatitis on back v/s cellulitis  Cont abx coverage for planned 7 days of tx   Deconditioning Reports that he is motivated to participate in  rehab   Morbid obesity - Body mass index is 45.57 kg/m.   DVT prophylaxis: Eliquis  Code Status: FULL CODE Family Communication: spoke w/ sister and wife at bedside  Disposition Plan: hopeful for CIR but if not, will need SNF for rehab  Consultants:  PCCM Cardiology   Antimicrobials:  Levaquin 2/3 >2/5 + 2/7  Vancomycin 2/7 > 2/12 Azactam 2/7 >2/12 Clindamycin 2/26 >  Objective: Blood pressure (!) 130/92, pulse 82, temperature (!) 97.5 F (36.4 C), temperature source Oral, resp. rate 20, height '5\' 11"'$  (1.803 m), weight (!) 148.2 kg (326 lb 11.6 oz), SpO2 (!) 88 %.  Intake/Output Summary (Last 24 hours) at 03/12/2017 1330 Last data filed at 03/12/2017 0600 Gross per 24 hour  Intake 610 ml  Output 1425 ml  Net -815 ml   Filed Weights   03/08/17 0500 03/09/17 0421 03/11/17 0328  Weight: (!) 143.5 kg (316 lb 5.8 oz) (!) 150 kg (330 lb 11 oz) (!) 148.2 kg (326 lb 11.6 oz)    Examination: General: No acute respiratory distress - speaking w/ PMV in place  Lungs: distant breath sounds th/o - no crackles or wheezing  Cardiovascular: irreg irreg - controlled  Abdomen: NT/ND, morbidly obese, soft, bowel sounds positive Extremities: 1+ B LE edema w/o signif change   CBC: Recent Labs  Lab 03/06/17 0606 03/07/17 0436 03/12/17  0334  WBC 11.2* 9.9 11.1*  HGB 11.7* 11.6* 11.9*  HCT 37.4* 36.3* 37.5*  MCV 94.2 93.6 93.5  PLT 216 223 758   Basic Metabolic Panel: Recent Labs  Lab 03/07/17 0436 03/08/17 0459 03/09/17 0359 03/09/17 1436 03/10/17 0430 03/11/17 0500 03/12/17 0334  NA 137 135 136  --  139 142 140  K 3.9 4.0 3.1* 3.4* 3.2* 3.2* 3.3*  CL 100* 100* 102  --  101 100* 100*  CO2 '28 27 25  '$ --  '27 27 28  '$ GLUCOSE 114* 100* 129*  --  131* 76 111*  BUN 30* 22* 24*  --  27* 28* 34*  CREATININE 0.57* 0.58* 0.56*  --  0.60* 0.62 0.61  CALCIUM 8.0* 8.0* 7.5*  --  8.3* 8.5* 8.3*  MG 1.9  --   --  1.7 1.8 2.1 2.0   GFR: Estimated Creatinine Clearance: 143.3 mL/min (by  C-G formula based on SCr of 0.61 mg/dL).  Liver Function Tests: Recent Labs  Lab 03/07/17 0436  AST 33  ALT 42  ALKPHOS 98  BILITOT 2.0*  PROT 5.6*  ALBUMIN 1.9*    HbA1C: Hgb A1c MFr Bld  Date/Time Value Ref Range Status  02/16/2017 11:00 PM 6.3 (H) 4.8 - 5.6 % Final    Comment:    (NOTE) Pre diabetes:          5.7%-6.4% Diabetes:              >6.4% Glycemic control for   <7.0% adults with diabetes     CBG: Recent Labs  Lab 03/11/17 0726 03/11/17 1610 03/11/17 1937 03/12/17 0732 03/12/17 1135  GLUCAP 131* 102* 110* 95 125*     Scheduled Meds: . allopurinol  300 mg Per Tube Daily  . amLODipine  5 mg Per Tube Daily  . apixaban  5 mg Per Tube BID  . aspirin  81 mg Per Tube Daily  . atorvastatin  20 mg Per Tube q1800  . carvedilol  25 mg Per Tube BID WC  . chlorhexidine gluconate (MEDLINE KIT)  15 mL Mouth Rinse BID  . Chlorhexidine Gluconate Cloth  6 each Topical Daily  . collagenase   Topical Daily  . febuxostat  40 mg Per Tube Daily  . feeding supplement (PRO-STAT SUGAR FREE 64)  60 mL Per Tube TID  . furosemide  80 mg Intravenous BID  . insulin aspart  0-15 Units Subcutaneous Q4H  . ipratropium-albuterol  3 mL Nebulization TID  . losartan  50 mg Per Tube Daily  . mouth rinse  15 mL Mouth Rinse QID  . Melatonin  6 mg Per Tube QHS  . mupirocin cream   Topical Daily  . pantoprazole sodium  40 mg Per Tube Q1200  . potassium chloride  40 mEq Per Tube Daily  . sertraline  50 mg Per Tube Daily  . sodium chloride flush  10-40 mL Intracatheter Q12H     LOS: 24 days   Cherene Altes, MD [facility] Office  [phone] Pager - Text Page per Amion as per below:  On-Call/Text Page:      [email or web address]      password [email or web address]  If 7PM-7AM, please contact night-coverage [email or web address] Password [email or web address] 03/12/2017, 1:30 PM

## 2017-03-13 ENCOUNTER — Inpatient Hospital Stay (HOSPITAL_COMMUNITY): Payer: BLUE CROSS/BLUE SHIELD

## 2017-03-13 DIAGNOSIS — J9601 Acute respiratory failure with hypoxia: Secondary | ICD-10-CM

## 2017-03-13 DIAGNOSIS — Z930 Tracheostomy status: Secondary | ICD-10-CM

## 2017-03-13 DIAGNOSIS — D72829 Elevated white blood cell count, unspecified: Secondary | ICD-10-CM

## 2017-03-13 DIAGNOSIS — R0902 Hypoxemia: Secondary | ICD-10-CM

## 2017-03-13 DIAGNOSIS — R1310 Dysphagia, unspecified: Secondary | ICD-10-CM

## 2017-03-13 DIAGNOSIS — C8590 Non-Hodgkin lymphoma, unspecified, unspecified site: Secondary | ICD-10-CM

## 2017-03-13 DIAGNOSIS — G7281 Critical illness myopathy: Secondary | ICD-10-CM

## 2017-03-13 DIAGNOSIS — J189 Pneumonia, unspecified organism: Secondary | ICD-10-CM

## 2017-03-13 DIAGNOSIS — I10 Essential (primary) hypertension: Secondary | ICD-10-CM

## 2017-03-13 DIAGNOSIS — E876 Hypokalemia: Secondary | ICD-10-CM

## 2017-03-13 LAB — BASIC METABOLIC PANEL
Anion gap: 9 (ref 5–15)
BUN: 35 mg/dL — ABNORMAL HIGH (ref 6–20)
CHLORIDE: 104 mmol/L (ref 101–111)
CO2: 30 mmol/L (ref 22–32)
CREATININE: 0.59 mg/dL — AB (ref 0.61–1.24)
Calcium: 8.6 mg/dL — ABNORMAL LOW (ref 8.9–10.3)
GFR calc non Af Amer: >60 mL/min (ref >60)
Glucose, Bld: 111 mg/dL — ABNORMAL HIGH (ref 65–99)
POTASSIUM: 3.2 mmol/L — AB (ref 3.5–5.1)
Sodium: 143 mmol/L (ref 135–145)

## 2017-03-13 LAB — GLUCOSE, CAPILLARY
GLUCOSE-CAPILLARY: 114 mg/dL — AB (ref 65–99)
GLUCOSE-CAPILLARY: 106 mg/dL — AB (ref 65–99)
Glucose-Capillary: 109 mg/dL — ABNORMAL HIGH (ref 65–99)
Glucose-Capillary: 110 mg/dL — ABNORMAL HIGH (ref 65–99)
Glucose-Capillary: 144 mg/dL — ABNORMAL HIGH (ref 65–99)

## 2017-03-13 LAB — CBC
HEMATOCRIT: 38.8 % — AB (ref 39.0–52.0)
HEMOGLOBIN: 12.1 g/dL — AB (ref 13.0–17.0)
MCH: 29.3 pg (ref 26.0–34.0)
MCHC: 31.2 g/dL (ref 30.0–36.0)
MCV: 93.9 fL (ref 78.0–100.0)
PLATELETS: 257 10*3/uL (ref 150–400)
RBC: 4.13 MIL/uL — AB (ref 4.22–5.81)
RDW: 16.7 % — ABNORMAL HIGH (ref 11.5–15.5)
WBC: 13 10*3/uL — ABNORMAL HIGH (ref 4.0–10.5)

## 2017-03-13 MED ORDER — POTASSIUM CHLORIDE 20 MEQ/15ML (10%) PO SOLN
40.0000 meq | Freq: Two times a day (BID) | ORAL | Status: DC
Start: 2017-03-13 — End: 2017-03-22
  Administered 2017-03-13 – 2017-03-22 (×18): 40 meq
  Filled 2017-03-13 (×18): qty 30

## 2017-03-13 NOTE — Progress Notes (Addendum)
Physical Therapy Treatment Patient Details Name: [name] MRN: [id number] DOB: 09/04/1955 Today's Date: 03/13/2017    History of Present Illness Pt is a 62 y/o male history of lymphoma in remission admitted with acute on chronic hypoxemic and hypercapnic respiratory failure due to influenza A.  He was also noted to have a new LVEF of 25% with contribution of cardiogenic edema.  He failed extubation on 2/14.  Based on his need for continued ventilatory support he underwent percutaneous tracheostomy on 2/14.    PT Comments    Pt admitted with above diagnosis. Pt currently with functional limitations due to balance and endurance deficits. Pt was able to transfer to chair with mod to max assist with RW.  PRogressing well and feel that pt is ready for Rehab.  Will consult Rehab.  Pt will benefit from skilled PT to increase their independence and safety with mobility to allow discharge to the venue listed below.     Follow Up Recommendations  CIR;Supervision/Assistance - 24 hour     Equipment Recommendations  None recommended by PT    Recommendations for Other Services       Precautions / Restrictions Precautions Precautions: Fall Precaution Comments: trach, cortrak Restrictions Weight Bearing Restrictions: No    Mobility  Bed Mobility Overal bed mobility: Needs Assistance Bed Mobility: Supine to Sit;Sit to Supine Rolling: Mod assist;+2 for physical assistance Sidelying to sit: Mod assist;+2 for physical assistance Supine to sit: +2 for physical assistance;Mod assist     General bed mobility comments: assist for LEs to EOB and to raise trunk, took incr time to come to EOB, pt able to position his hips at EOB with supervision  Transfers Overall transfer level: Needs assistance Equipment used: Rolling walker (2 wheeled) Transfers: Sit to/from [facility] Sit to Stand: Max assist;+2 physical assistance Stand pivot transfers: Max assist;+2 physical assistance        General transfer comment: Pt stood to RW with assist to power up, pt with anxiety, but was able to take steps for transfer to chair.  Pt did need assist to control descent.   Ambulation/Gait                 Stairs            Wheelchair Mobility    Modified Rankin (Stroke Patients Only)       Balance Overall balance assessment: Needs assistance Sitting-balance support: Single extremity supported;Feet unsupported Sitting balance-Leahy Scale: Fair Sitting balance - Comments: statically Postural control: Posterior lean;Left lateral lean Standing balance support: Bilateral upper extremity supported;During functional activity Standing balance-Leahy Scale: Poor Standing balance comment: pt able to stand with mod to min assist statically with rolling walker for support                             Cognition Arousal/Alertness: Awake/alert Behavior During Therapy: Anxious Overall Cognitive Status: Difficult to assess                                 General Comments: follows commands, some tangential comments, pt with low volume, difficult to understand at times      Exercises General Exercises - Lower Extremity Ankle Circles/Pumps: AROM;Both;20 reps;Supine Long Arc Quad: AROM;Both;10 reps;Seated Hip Flexion/Marching: AROM;Both;10 reps;Seated    General Comments General comments (skin integrity, edema, etc.): Trach collar 80% with VSSalthough not sure why pt is  on 80%.  VSS with sats >88% throughout.        Pertinent Vitals/Pain Pain Assessment: No/denies pain    Home Living                      Prior Function            PT Goals (current goals can now be found in the care plan section) Acute Rehab PT Goals Patient Stated Goal: wanting to work on anything that will make him stronger Progress towards PT goals: Progressing toward goals    Frequency    Min 3X/week      PT Plan Discharge plan needs to be updated     Co-evaluation              AM-PAC PT "6 Clicks" Daily Activity  Outcome Measure  Difficulty turning over in bed (including adjusting bedclothes, sheets and blankets)?: A Lot Difficulty moving from lying on back to sitting on the side of the bed? : A Lot Difficulty sitting down on and standing up from a chair with arms (e.g., wheelchair, bedside commode, etc,.)?: A Lot Help needed moving to and from a bed to chair (including a wheelchair)?: A Lot Help needed walking in hospital room?: Total Help needed climbing 3-5 steps with a railing? : Total 6 Click Score: 10    End of Session Equipment Utilized During Treatment: (trach collar) Activity Tolerance: Patient limited by fatigue Patient left: with call bell/phone within reach;with family/visitor present;in chair Nurse Communication: Mobility status;Need for lift equipment PT Visit Diagnosis: Other abnormalities of gait and mobility (R26.89)     Time: 1000-1023 PT Time Calculation (min) (ACUTE ONLY): 23 min  Charges:  $Therapeutic Exercise: 8-22 mins $Therapeutic Activity: 8-22 mins                    G Codes:       [place] [phone] (pager)    Denice Paradise 03/13/2017, 12:13 PM

## 2017-03-13 NOTE — Progress Notes (Signed)
  Speech Language Pathology Treatment: Dysphagia;Passy Muir Speaking valve  Patient Details Name: [name] MRN: [id number] DOB: 1955/03/20 Today's Date: 03/13/2017 Time: 1021-1039 SLP Time Calculation (min) (ACUTE ONLY): 18 min  Assessment / Plan / Recommendation Clinical Impression  Pt presents with continued tolerance pf PMSV as indicated by improved vocal quality and lack of back pressure upon removal. Pt reports that voice is nearing baseline, but still softer in comparison. Despite no overt signs of increased WOB, pt's O2 saturations were noted to be variable (88-99) with increased supplemental oxygen via trach collar compared to previous session. Unclear why he is needing more supplemental O2. Pt consumed a puree consistency snack, requiring min verbal cues touse X4 hard swallows following each bite of puree. Pt had delayed coughing upon completion of snack, but no other s/s concerning for aspiration this session. Recommend continuing NPO status, with Dys 1 and Honey Thick Liquid snacks from floor stock following aspiration precautions, with PMSV in place, and maintaining X4 hard swallows following each bite/sip. Plan for repeat testing tomorrow after trach change today to determine current oropharyngeal swallow function and readiness to initiate diet. Continue to recommend pt wear PMSV during all waking hours as tolerated with intermittent supervision.       HPI HPI: 62y/o male with a history of lymphoma in remission(last treated in 2012)who was admitted with ARDS due to influenza, andfound to have new severe systolic congestive heart failure with an EF of 25%.Pt was intubated on 2/9, trach placed at bedside on 2/24. #6 distal XLT placed on 2/24 due to cuff leak. Vent setting on 2/25: CPAP/PS FiO2 40%; PEEP 8; Pt has Cortrak.       SLP Plan  Continue with current plan of care       Recommendations  Diet recommendations: NPO(with Dys 1 and Honey thick liquid snacks from floor  stock) Liquids provided via: Cup;Teaspoon;No straw Medication Administration: Via alternative means Compensations: Slow rate;Small sips/bites;Multiple dry swallows after each bite/sip Postural Changes and/or Swallow Maneuvers: Seated upright 90 degrees;Upright 30-60 min after meal      Patient may use Passy-Muir Speech Valve: During all therapies with supervision;During all waking hours (remove during sleep) PMSV Supervision: Intermittent MD: Please consider changing trach tube to : Cuffless;Smaller size         Oral Care Recommendations: Oral care QID Follow up Recommendations: LTACH SLP Visit Diagnosis: Dysphagia, pharyngeal phase (R13.13);Aphonia (R49.1) Plan: Continue with current plan of care       GO              [name] SLP Student Clinician   [name] 03/13/2017, 11:58 AM

## 2017-03-13 NOTE — Progress Notes (Signed)
PULMONARY / CRITICAL CARE MEDICINE   Name: [name] MRN: [id number] DOB: 1955-10-30    ADMISSION DATE:  02/16/2017 CONSULTATION DATE:  02/16/2017  REFERRING MD:  Regenia Skeeter  CHIEF COMPLAINT:  Dyspnea  HISTORY OF PRESENT ILLNESS:   62y/o male with a history of lymphoma in remission, last treated in 2012 who was admitted with ARDS due to influenza , found to have new severe systolic congestive heart failure with an EF of 25%  SUBJECTIVE: Tolerated TC overnight.  STUDIES:  2/8 echo > EF 25-30% moderately dilated left ventricle, diffuse hypokinesis mild mitral valve regurgitation, left atrium mildly dilated  CULTURES: 2/7 BCx 2 >> negative 2/7 RVP >> influenza A 02/18/2017 BAL>> negative  ANTIBIOTICS: Levaquin 2/3 >2/5; 2/7 x 1 Vancomycin 2/7 >> 2/12 Azactam 2/7 >> 2/12 Tamiflu 2/8 > completed   SIGNIFICANT EVENTS: 2/7 Admit 02/18/2017 intubated for acute respiratory failure 02/18/2017 trach 2/25 tx sdu/trach changed  LINES/TUBES: Left chest port >> accessed 2/7 02/18/2017 orotracheal tube>>2/14 02/18/2017 orogastric tube>>2/14 Trach 2/14>>> changed to #6 shiley XLT distal 2/24  3/3 changed to xlt 5   Subjective Feeling stronger  VITAL SIGNS: BP (!) 71/53   Pulse 97   Temp 97.7 F (36.5 C) (Oral)   Resp 20   Ht _0  (1.803 m)   Wt (!) 317 lb 3.9 oz (143.9 kg)   SpO2 90%   BMI 44.25 kg/m   HEMODYNAMICS:    VENTILATOR SETTINGS: FiO2 (%):  [80 %] 80 % Filed Weights   03/09/17 0421 03/11/17 0328 03/12/17 1602  Weight: (!) 330 lb 11 oz (150 kg) (!) 326 lb 11.6 oz (148.2 kg) (!) 317 lb 3.9 oz (143.9 kg)   INTAKE / OUTPUT:  Intake/Output Summary (Last 24 hours) at 03/13/2017 1331 Last data filed at 03/13/2017 0400 Gross per 24 hour  Intake 1720 ml  Output 775 ml  Net 945 ml    PHYSICAL EXAMINATION: General:  MOWM, sitting upright in bed. No distress.  HEENT: NCAT. No JVD. 5 XLT cuffless trach in place. Excellent phonation Neuro: awake, alert no focal def   CV: reg irreg af PULM:decreased bases  GI: soft not tender + bowel sounds  Extremities: W&D brisk CR    LABS:  BMET Recent Labs  Lab 03/11/17 0500 03/12/17 0334 03/13/17 0238  NA 142 140 143  K 3.2* 3.3* 3.2*  CL 100* 100* 104  CO2 _1 BUN 28* 34* 35*  CREATININE 0.62 0.61 0.59*  GLUCOSE 76 111* 111*    CBC Recent Labs  Lab 03/07/17 0436 03/12/17 0334 03/13/17 0238  WBC 9.9 11.1* 13.0*  HGB 11.6* 11.9* 12.1*  HCT 36.3* 37.5* 38.8*  PLT 223 284 257    Imaging I reviewed CXR myself, trach is in good position    ASSESSMENT / PLAN:  Acute respiratory failure  Hypoxemia (on-going) Influenza ARDS New systolic CM EF 33% -->? Viral CM AF Tracheostomy status Dysphagia  H/o NH Lymphoma  Bilateral foot wounds  DM type II Dermatitis on back MO  Probable OSA Severe deconditioning    Discussion  62 year old [place] [name] admitted initially w/ acute respiratory failure d/t Influenza w/ resultant ARDS. Course was c/b new systolic CM (EF 29%) Pulmonary edema and new onset AF. He failed extubation while in ICU and subsequently had trach placed. He is now recovering. Was changed to 5 prox XLT on 3/4. He still is hypoxic. Suspect that this is a mix of atelectasis/hypoventilation.. Could be also element  of volume BUT he's getting active diuresis.   inteval hx 3/1 to 3/4:  3/1: trach changed to cuffless XLT.  3/1: using PMV; SLP recommending NPO status d/t delayed swallow  3/4 changed to 5xlt prox, still on 80% but can be weaned  Plan/rec Get out of bed & maximize activity Continue lasix Repeat CXR Eventually plan will be to down size to 4 & alternate w/ trach being capped and on [place] then open w/ ATC at HS. I would NOT decannulate him until he has a sleep study, he almost certainly has untreated OSA  And needs CPAP Check am ABG, need to ensure No hypercarbia   Erick Colace ACNP-BC [place] Pager # [phone] OR # [phone] if no  answer

## 2017-03-13 NOTE — Consult Note (Signed)
Physical Medicine and Rehabilitation Consult Reason for Consult: Decreased functional mobility Referring Physician: [facility]   HPI: [name] is a 62 y.o. right handed male with history of non-Hodgkin's lymphoma stage III last treated with chemotherapy and radiation 2012, hypertension, chronic bilateral foot wound with ischemic changes. Per chart review, patient lives alone independent prior to admission and still working. He has family in the area that can assist as needed. Presented 02/16/2017 with ARDS due to influenza. Patient did require intubation and tracheostomy 02/23/2017 changed to a #5 cuff less XLT 03/10/2017 with hopes to downsize again soon. He is currently using PMV followed by pulmonary services. Hospital course treated for severe systolic congestive heart failure with noted ejection fraction of 25%. New onset persistent atrial fibrillation follow-up cardiology services placed on Eliquis. Currently NPO with Cortrak tube for nutritional support.WOC follow-up for skin care as directed or sacrum and buttocks and chronic foot wounds. Physical occupational therapy evaluations completed and ongoing questions of possible need for LTAC versus CIR.  Review of Systems  HENT: Negative for hearing loss.   Eyes: Negative for blurred vision and double vision.  Respiratory: Positive for cough and shortness of breath.   Cardiovascular: Positive for leg swelling. Negative for chest pain.  Gastrointestinal: Positive for constipation and nausea. Negative for vomiting.  Genitourinary: Negative for dysuria, flank pain and hematuria.  Musculoskeletal: Positive for joint pain and myalgias.  Skin: Negative for rash.  Neurological: Positive for weakness.  All other systems reviewed and are negative.  Past Medical History:  Diagnosis Date  . Dyslipidemia   . Gout attack 01/16/2012  . Hypertension   . nhl dx'd 01/2010  . Venous insufficiency    Past Surgical History:  Procedure Laterality  Date  . BRONCHOSCOPY    . ESOPHAGOGASTRODUODENOSCOPY ENDOSCOPY     Dr Jearld Fenton  . INGUINAL HERNIA REPAIR     2001  . PORT-A-CATH REMOVAL     Family History  Problem Relation Age of Onset  . Colon cancer Neg Hx   . Pancreatic cancer Neg Hx   . Stomach cancer Neg Hx   . Rectal cancer Neg Hx    Social History:  reports that he quit smoking about 5 years ago. He has a 10.00 pack-year smoking history. he has never used smokeless tobacco. He reports that he does not drink alcohol or use drugs. Allergies:  Allergies  Allergen Reactions  . Penicillins Swelling    Age 34 or 52 - pt states he had swelling all over and his lips got real big   Medications Prior to Admission  Medication Sig Dispense Refill  . allopurinol (ZYLOPRIM) 300 MG tablet Take 300 mg by mouth daily.  2  . amLODipine (NORVASC) 5 MG tablet Take 5 mg by mouth daily.      [name] Kitchen atorvastatin (LIPITOR) 20 MG tablet Take 20 mg by mouth daily.   5  . BENICAR HCT 40-25 MG per tablet Take 1 tablet by mouth daily.     . carvedilol (COREG) 12.5 MG tablet Take 12.5 mg by mouth 2 (two) times daily with a meal.     . Cholecalciferol (VITAMIN D-3 PO) Take 1 tablet by mouth daily. 500mg     . CONTRAVE 8-90 MG TB12 Take 2 tablets by mouth 2 (two) times daily.  6  . Ibuprofen (ADVIL) 200 MG CAPS Take 400 mg by mouth as needed (pain).     [name] Kitchen levofloxacin (LEVAQUIN) 750 MG tablet Take 750 mg by mouth  daily. x7 days.  0  . multivitamin-iron-minerals-folic acid (CENTRUM) chewable tablet Chew 1 tablet by mouth daily.    . Omega-3 Fatty Acids (FISH OIL PO) Take 1 tablet by mouth daily.      [name] Kitchen ULORIC 40 MG tablet Take 40 mg by mouth daily.  11    Home: Home Living Family/patient expects to be discharged to:: Other (Comment)(LTACH v. SNF depending on vent wean) Living Arrangements: Spouse/significant other Additional Comments: pt on vent and unresponsive throughout  Functional History: Prior Function Level of Independence: Independent Comments:  uncertain, pt unresponsive with trach and on vent - per chart review of case management's note, pt previously independent and lives alone Functional Status:  Mobility: Bed Mobility Overal bed mobility: Needs Assistance Bed Mobility: Supine to Sit, Sit to Supine Rolling: Mod assist, +2 for physical assistance Sidelying to sit: Mod assist, +2 for physical assistance Supine to sit: +2 for physical assistance, Mod assist Sit to supine: Max assist, +2 for physical assistance General bed mobility comments: assist for LEs to EOB and to raise trunk, took incr time to come to EOB, pt able to position his hips at EOB with supervision Transfers Overall transfer level: Needs assistance Equipment used: Rolling walker (2 wheeled) Transfers: Sit to/from Stand, Stand Pivot Transfers Sit to Stand: Max assist, +2 physical assistance Stand pivot transfers: Max assist, +2 physical assistance Squat pivot transfers: +2 physical assistance, Max assist(3rd person present for safety. ) General transfer comment: Pt stood to RW with assist to power up, pt with anxiety, but was able to take steps for transfer to chair.  Pt did need assist to control descent.       ADL: ADL Overall ADL's : Needs assistance/impaired Eating/Feeding: NPO Grooming: Moderate assistance, Bed level Upper Body Bathing: Maximal assistance, Bed level Lower Body Bathing: Total assistance, Bed level Upper Body Dressing : Total assistance, Bed level Lower Body Dressing: Total assistance, Bed level General ADL Comments: Pt able to complete hand to mouth movements today.  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 Attempted to position pt in preparation for use of Vital Go tilt for progression to standing. However, when laid flat in preparation to tilt, pt and significant other requesting to sit head up due to pt feeling that he was unable to breathe (SpO2 93%). At this point, bed controls  were not responding. We were able to adjust and regain control of bed controls. However, pt with significant overall pain at this point and deferred further tilting. Positioned B LE to prevent pressing against feet board which has been causing him pain.   Cognition: Cognition Overall Cognitive Status: Difficult to assess Orientation Level: Oriented X4 Cognition Arousal/Alertness: Awake/alert Behavior During Therapy: Anxious Overall Cognitive Status: Difficult to assess General Comments: follows commands, some tangential comments, pt with low volume, difficult to understand at times Difficult to assess due to: Tracheostomy  Blood pressure (!) 71/53, pulse 84, temperature 97.7 F (36.5 C), temperature source Oral, resp. rate 18, height 5\' 11"  (1.803 m), weight (!) 143.9 kg (317 lb 3.9 oz), SpO2 93 %. Physical Exam  Constitutional: He is oriented to person, place, and time. He appears well-developed.  62 year old right-handed obese male  HENT:  Head: Normocephalic and atraumatic.  Nasogastric tube in place  Eyes: EOM are normal. Right eye exhibits no discharge. Left eye exhibits no discharge.  Neck:  Tracheostomy tube in place  Cardiovascular:  Irregularly irregular  Respiratory:  Fair inspiratory  effort Upper airway sounds  GI: Soft. Bowel sounds are normal. He exhibits no distension. There is no tenderness.  Musculoskeletal: He exhibits no tenderness.  LE edema  Neurological: He is alert and oriented to person, place, and time.  Follows full commands Motor: LUE: 3+/5 proximal to distal RUE: 3/5 proximal to distal (some pain inhibition) B/l LE: 4/5 proximal to distal  Skin:  Sacrum and buttock wound not examined  Psychiatric: He has a normal mood and affect. His behavior is normal. Thought content normal.    Results for orders placed or performed during the hospital encounter of 02/16/17 (from the past 24 hour(s))  Glucose, capillary     Status: Abnormal   Collection Time:  03/12/17  5:26 PM  Result Value Ref Range   Glucose-Capillary 117 (H) 65 - 99 mg/dL  Basic metabolic panel     Status: Abnormal   Collection Time: 03/13/17  2:38 AM  Result Value Ref Range   Sodium 143 135 - 145 mmol/L   Potassium 3.2 (L) 3.5 - 5.1 mmol/L   Chloride 104 101 - 111 mmol/L   CO2 30 22 - 32 mmol/L   Glucose, Bld 111 (H) 65 - 99 mg/dL   BUN 35 (H) 6 - 20 mg/dL   Creatinine, Ser 0.59 (L) 0.61 - 1.24 mg/dL   Calcium 8.6 (L) 8.9 - 10.3 mg/dL   GFR calc non Af Amer >60 >60 mL/min   GFR calc Af Amer >60 >60 mL/min   Anion gap 9 5 - 15  CBC     Status: Abnormal   Collection Time: 03/13/17  2:38 AM  Result Value Ref Range   WBC 13.0 (H) 4.0 - 10.5 K/uL   RBC 4.13 (L) 4.22 - 5.81 MIL/uL   Hemoglobin 12.1 (L) 13.0 - 17.0 g/dL   HCT 38.8 (L) 39.0 - 52.0 %   MCV 93.9 78.0 - 100.0 fL   MCH 29.3 26.0 - 34.0 pg   MCHC 31.2 30.0 - 36.0 g/dL   RDW 16.7 (H) 11.5 - 15.5 %   Platelets 257 150 - 400 K/uL  Glucose, capillary     Status: Abnormal   Collection Time: 03/13/17  5:43 AM  Result Value Ref Range   Glucose-Capillary 109 (H) 65 - 99 mg/dL  Glucose, capillary     Status: Abnormal   Collection Time: 03/13/17  7:46 AM  Result Value Ref Range   Glucose-Capillary 110 (H) 65 - 99 mg/dL  Glucose, capillary     Status: Abnormal   Collection Time: 03/13/17 12:55 PM  Result Value Ref Range   Glucose-Capillary 144 (H) 65 - 99 mg/dL   No results found.  Assessment/Plan: Diagnosis: CIM Labs independently reviewed.  Records reviewed and summated above.  1. Does the need for close, 24 hr/day medical supervision in concert with the patient's rehab needs make it unreasonable for this patient to be served in a less intensive setting? Yes  2. Co-Morbidities requiring supervision/potential complications: dysphagia (advance diet as tolerated), persistent atrial fibrillation (cont meds, monitor HR with increased mobility), severe systolic congestive heart failure (Monitor in accordance with  increased physical activity and avoid UE resistance excercises), non-Hodgkin's lymphoma stage III last treated with chemotherapy and radiation 2012, HTN (monitor and provide prns in accordance with increased physical exertion and pain), chronic bilateral foot wound with ischemic changes, leukocytosis (cont to monitor for signs and symptoms of infection, further workup if indicated), hypokalemia (continue to monitor and replete as necessary) 3. Due to bladder management,  safety, skin/wound care, disease management, pain management and patient education, does the patient require 24 hr/day rehab nursing? Yes 4. Does the patient require coordinated care of a physician, rehab nurse, PT (1-2 hrs/day, 5 days/week), OT (1-2 hrs/day, 5 days/week) and SLP (1-2 hrs/day, 5 days/week) to address physical and functional deficits in the context of the above medical diagnosis(es)? Yes Addressing deficits in the following areas: balance, endurance, locomotion, strength, transferring, bathing, dressing, toileting, swallowing and psychosocial support 5. Can the patient actively participate in an intensive therapy program of at least 3 hrs of therapy per day at least 5 days per week? Yes 6. The potential for patient to make measurable gains while on inpatient rehab is excellent 7. Anticipated functional outcomes upon discharge from inpatient rehab are supervision and min assist  with PT, supervision and min assist with OT, modified independent and supervision with SLP. 8. Estimated rehab length of stay to reach the above functional goals is: 18-22 days. 9. Anticipated D/C setting: Home 10. Anticipated post D/C treatments: HH therapy and Home excercise program 11. Overall Rehab/Functional Prognosis: good  RECOMMENDATIONS: This patient's condition is appropriate for continued rehabilitative care in the following setting: CIR when medically stable and able to tolerate 3 hours of therapy/day. Patient has agreed to participate  in recommended program. Yes Note that insurance prior authorization may be required for reimbursement for recommended care.  Comment: Rehab Admissions Coordinator to follow up.  Delice Lesch, MD, ABPMR Lavon Paganini Angiulli, PA-C 03/13/2017

## 2017-03-13 NOTE — Discharge Instructions (Signed)

## 2017-03-13 NOTE — Progress Notes (Signed)
Titrating FIO2 down based on sat goal of 88% or greater per MD.

## 2017-03-13 NOTE — Progress Notes (Signed)
Went in to patient's room this evening at approximately 1645 and found his friend feeding him ice. I inquired and she said "they said he could have ice". I asked who, and the friend said it was speech.  I told her that speech had not said anything to me about ice, but she continued to shovel ice in his mouth. I advised the visitor that we were concerned with the patient's safety, and that ice was not part of what I could see ordered for him.  I looked in the notes and didn't see anything and attempted to call Mickel Baas, Speech Therapist, but it was after 5 by that time.

## 2017-03-13 NOTE — Progress Notes (Signed)
[place] TEAM 1 - Stepdown/ICU TEAM  RONN SMOLINSKY  KYH:[id number] DOB: Dec 17, 1955 DOA: 02/16/2017 PCP: [name], MD    Brief Narrative:  62y/o male with a history of lymphoma in remission (last treated in 2012) who was admitted with ARDS due to influenza, and found to have new severe systolic congestive heart failure with an EF of 25%.    Significant Events: 2/7 admit  2/8 TTE - EF 25-30% - diffuse hypokinesis - mild mitral regurg 2/9 intubated  2/14 tracheostomy  2/24 trach changed to #6 distal XLT due to cuff leak  3/1 trach downsized to #5 cuffless XLT  Subjective: The patient is resting comfortably in bed.  His voice is becoming stronger.  He has been working with therapy in a dedicated fashion and is highly motivated to continue with aggressive rehab.  He denies chest pain nausea vomiting or abdominal pain.  Assessment & Plan:  ARDS due to Influenza A   s/p trach - trach and vent care has been provided by PCCM - now off vent since 2/26 - plan to downsize trach and if tolerates will cap x48hrs toward goal of decannulation as per timing of PCCM  Acute pulmonary edema due to Acute systolic CHF (EF 15%) Cardiology has evaluated - unclear duration/new diagnosis - ?viral CM - coronary eval as outpt as per Cards - net negative ~14,000 since admit  - cont BB, ARB - cont diuretic   New onset persistent Atrial fibrillation   On eliquis - rate well controlled   Dysphagia  Cont to work w/ SLP - cont Cortrak for now - is presently allowed some limited modified intake w/ RN assist, which he is tolerating well - SLP to continue to work w/ pt   Hypomagnesemia Corrected   Hypokalemia Cont to supplement w/ ongoing use of diuretic   History of Non-Hodgkins lymphoma Has porta-cath in L chest   Bilateral foot wounds Wound care  DM2   CBG well controlled   Dermatitis on back v/s cellulitis  Has completed a tx course of clindamycin   Deconditioning Reports that he is highly  motivated to participate in rehab   Morbid obesity - Body mass index is 44.25 kg/m.   DVT prophylaxis: Eliquis  Code Status: FULL CODE Family Communication: spoke w/ sister and wife at bedside  Disposition Plan: hopeful for CIR   Consultants:  PCCM Cardiology   Antimicrobials:  Levaquin 2/3 >2/5 + 2/7  Vancomycin 2/7 > 2/12 Azactam 2/7 >2/12 Clindamycin 2/26 > 3/3  Objective: Blood pressure (!) 71/53, pulse 84, temperature 97.7 F (36.5 C), temperature source Oral, resp. rate 18, height _0  (1.803 m), weight (!) 143.9 kg (317 lb 3.9 oz), SpO2 93 %.  Intake/Output Summary (Last 24 hours) at 03/13/2017 1502 Last data filed at 03/13/2017 0400 Gross per 24 hour  Intake 1720 ml  Output 775 ml  Net 945 ml   Filed Weights   03/09/17 0421 03/11/17 0328 03/12/17 1602  Weight: (!) 150 kg (330 lb 11 oz) (!) 148.2 kg (326 lb 11.6 oz) (!) 143.9 kg (317 lb 3.9 oz)    Examination: General: No acute respiratory distress - speaking w/ PMV in place  Lungs: distant breath sounds th/o w/o wheezing  Cardiovascular: irreg irreg - controlled  Abdomen: NT/ND, morbidly obese, soft, BS+ Extremities: 1+ B LE edema - much improved overall   CBC: Recent Labs  Lab 03/07/17 0436 03/12/17 0334 03/13/17 0238  WBC 9.9 11.1* 13.0*  HGB 11.6* 11.9* 12.1*  HCT 36.3* 37.5* 38.8*  MCV 93.6 93.5 93.9  PLT 223 284 761   Basic Metabolic Panel: Recent Labs  Lab 03/07/17 0436  03/09/17 0359 03/09/17 1436 03/10/17 0430 03/11/17 0500 03/12/17 0334 03/13/17 0238  NA 137   < > 136  --  139 142 140 143  K 3.9   < > 3.1* 3.4* 3.2* 3.2* 3.3* 3.2*  CL 100*   < > 102  --  101 100* 100* 104  CO2 28   < > 25  --  _0 GLUCOSE 114*   < > 129*  --  131* 76 111* 111*  BUN 30*   < > 24*  --  27* 28* 34* 35*  CREATININE 0.57*   < > 0.56*  --  0.60* 0.62 0.61 0.59*  CALCIUM 8.0*   < > 7.5*  --  8.3* 8.5* 8.3* 8.6*  MG 1.9  --   --  1.7 1.8 2.1 2.0  --    < > = values in this interval not  displayed.   GFR: Estimated Creatinine Clearance: 140.9 mL/min (A) (by C-G formula based on SCr of 0.59 mg/dL (L)).  Liver Function Tests: Recent Labs  Lab 03/07/17 0436  AST 33  ALT 42  ALKPHOS 98  BILITOT 2.0*  PROT 5.6*  ALBUMIN 1.9*    HbA1C: Hgb A1c MFr Bld  Date/Time Value Ref Range Status  02/16/2017 11:00 PM 6.3 (H) 4.8 - 5.6 % Final    Comment:    (NOTE) Pre diabetes:          5.7%-6.4% Diabetes:              >6.4% Glycemic control for   <7.0% adults with diabetes     CBG: Recent Labs  Lab 03/12/17 1135 03/12/17 1726 03/13/17 0543 03/13/17 0746 03/13/17 1255  GLUCAP 125* 117* 109* 110* 144*     Scheduled Meds: . allopurinol  300 mg Per Tube Daily  . amLODipine  5 mg Per Tube Daily  . apixaban  5 mg Per Tube BID  . aspirin  81 mg Per Tube Daily  . atorvastatin  20 mg Per Tube q1800  . carvedilol  25 mg Per Tube BID WC  . chlorhexidine gluconate (MEDLINE KIT)  15 mL Mouth Rinse BID  . Chlorhexidine Gluconate Cloth  6 each Topical Daily  . collagenase   Topical Daily  . febuxostat  40 mg Per Tube Daily  . feeding supplement (PRO-STAT SUGAR FREE 64)  60 mL Per Tube TID  . furosemide  80 mg Intravenous BID  . insulin aspart  0-15 Units Subcutaneous Q4H  . ipratropium-albuterol  3 mL Nebulization TID  . losartan  50 mg Per Tube Daily  . mouth rinse  15 mL Mouth Rinse QID  . Melatonin  6 mg Per Tube QHS  . mupirocin cream   Topical Daily  . pantoprazole sodium  40 mg Per Tube Q1200  . potassium chloride  40 mEq Per Tube Daily  . sertraline  50 mg Per Tube Daily  . sodium chloride flush  10-40 mL Intracatheter Q12H     LOS: 25 days   Cherene Altes, MD [facility] Office  [phone] Pager - Text Page per Amion as per below:  On-Call/Text Page:      [email or web address]      password [email or web address]  If 7PM-7AM, please contact night-coverage [email or web address] Password [email or web address] 03/13/2017, 3:02 PM

## 2017-03-13 NOTE — Progress Notes (Signed)
Inpatient Rehabilitation  Per PT request, patient was screened by [facility] [place] for appropriateness for an Inpatient Acute Rehab consult.  At this time we are recommending an Inpatient Rehab consult.  Text paged MD to notify; please order if you are agreeable.    Aysen Shieh, M.A., CCC/SLP Admission Coordinator  [facility]  Cell [phone]  

## 2017-03-14 LAB — BLOOD GAS, ARTERIAL
Acid-Base Excess: 6.9 mmol/L — ABNORMAL HIGH (ref 0.0–2.0)
Bicarbonate: 30.7 mmol/L — ABNORMAL HIGH (ref 20.0–28.0)
Drawn by: 51133
O2 CONTENT: 40 L/min
O2 SAT: 92.3 %
PATIENT TEMPERATURE: 98.6
pCO2 arterial: 42.7 mmHg (ref 32.0–48.0)
pH, Arterial: 7.471 — ABNORMAL HIGH (ref 7.350–7.450)
pO2, Arterial: 64.1 mmHg — ABNORMAL LOW (ref 83.0–108.0)

## 2017-03-14 LAB — GLUCOSE, CAPILLARY
GLUCOSE-CAPILLARY: 108 mg/dL — AB (ref 65–99)
GLUCOSE-CAPILLARY: 115 mg/dL — AB (ref 65–99)
GLUCOSE-CAPILLARY: 103 mg/dL — AB (ref 65–99)
GLUCOSE-CAPILLARY: 120 mg/dL — AB (ref 65–99)
GLUCOSE-CAPILLARY: 112 mg/dL — AB (ref 65–99)
GLUCOSE-CAPILLARY: 113 mg/dL — AB (ref 65–99)
Glucose-Capillary: 123 mg/dL — ABNORMAL HIGH (ref 65–99)
Glucose-Capillary: 129 mg/dL — ABNORMAL HIGH (ref 65–99)
Glucose-Capillary: 129 mg/dL — ABNORMAL HIGH (ref 65–99)
Glucose-Capillary: 139 mg/dL — ABNORMAL HIGH (ref 65–99)
Glucose-Capillary: 93 mg/dL (ref 65–99)
Glucose-Capillary: 96 mg/dL (ref 65–99)

## 2017-03-14 LAB — BASIC METABOLIC PANEL
Anion gap: 9 (ref 5–15)
BUN: 33 mg/dL — AB (ref 6–20)
CHLORIDE: 104 mmol/L (ref 101–111)
CO2: 28 mmol/L (ref 22–32)
Calcium: 8.7 mg/dL — ABNORMAL LOW (ref 8.9–10.3)
Creatinine, Ser: 0.6 mg/dL — ABNORMAL LOW (ref 0.61–1.24)
GFR calc non Af Amer: >60 mL/min (ref >60)
Glucose, Bld: 110 mg/dL — ABNORMAL HIGH (ref 65–99)
POTASSIUM: 3.7 mmol/L (ref 3.5–5.1)
SODIUM: 141 mmol/L (ref 135–145)

## 2017-03-14 MED ORDER — ALBUTEROL SULFATE (2.5 MG/3ML) 0.083% IN NEBU: 2.5000 mg | INHALATION_SOLUTION | RESPIRATORY_TRACT | Status: DC | PRN

## 2017-03-14 MED ORDER — IPRATROPIUM-ALBUTEROL 0.5-2.5 (3) MG/3ML IN SOLN
3.0000 mL | Freq: Two times a day (BID) | RESPIRATORY_TRACT | Status: DC
  Administered 2017-03-14 – 2017-03-16 (×4): 3 mL via RESPIRATORY_TRACT
  Filled 2017-03-14 (×4): qty 3

## 2017-03-14 MED ORDER — FUROSEMIDE 80 MG PO TABS
80.0000 mg | ORAL_TABLET | Freq: Two times a day (BID) | ORAL | Status: DC
  Administered 2017-03-14 – 2017-03-22 (×16): 80 mg via ORAL
  Filled 2017-03-14 (×17): qty 1

## 2017-03-14 NOTE — Progress Notes (Addendum)
I met with pt and his friend, Jenny Reichmann, at bedside and had a conference call with his sister, [name] Art. We discussed goals and expectations of an inpt rehab admit. They are in agreement and they can provide 24/7 assist at home after d/c.Pt currently on 40% trach collar. I await further weaning of O2 needs before pursuing insurance approval for admit this week. Please advise. [phone]

## 2017-03-14 NOTE — Progress Notes (Signed)
Pt had a 19 beat run of Vtach non sustain. Pt asymptomatic. Pt denies having any chest pain or discomfort. On call provider notified. Will continue to monitor.

## 2017-03-14 NOTE — Progress Notes (Signed)
Trach assessment done on patient due to Sp02 hanging around 88-91% inner cannula changed by RN pt suctioned by RT with 91fr Cath didn't retrieve a lot of secretions.. Breath sounds are ok increased Fi02 to 50% for night time rest RN aware

## 2017-03-14 NOTE — Progress Notes (Signed)
Occupational Therapy Treatment Patient Details Name: [name] MRN: [id number] DOB: [birth year] Today's Date: 03/14/2017    History of present illness Pt is a 62 y/o male history of lymphoma in remission admitted with acute on chronic hypoxemic and hypercapnic respiratory failure due to influenza A.  He was also noted to have a new LVEF of 25% with contribution of cardiogenic edema.  He failed extubation on 2/14.  Based on his need for continued ventilatory support he underwent percutaneous tracheostomy on 2/14.   OT comments  Pt much less anxious with regard to mobility. One person assist to achieve sitting EOB, but continues to need 2 person assist for OOB. Pt able to self feed thickened drink. Updated d/c recommendation to CIR. Pt has good potential to progress to a supervision level with intensive rehab.   Follow Up Recommendations  CIR    Equipment Recommendations  Other (comment)(defer to next venue)    Recommendations for Other Services      Precautions / Restrictions Precautions Precautions: Fall Precaution Comments: trach, cortrak       Mobility Bed Mobility Overal bed mobility: Needs Assistance Bed Mobility: Rolling;Sidelying to Sit Rolling: Mod assist Sidelying to sit: Mod assist       General bed mobility comments: increased time, assist for LEs over EOB and to raise trunk  Transfers Overall transfer level: Needs assistance Equipment used: Rolling walker (2 wheeled) Transfers: Sit to/from [employer] Transfers Sit to Stand: +2 physical assistance;Min assist;From elevated surface Stand pivot transfers: +2 physical assistance;Min assist       General transfer comment: stood with assist to steady and use of momentum, R hand on RW with L hand pushing up from elevated bed    Balance Overall balance assessment: Needs assistance   Sitting balance-Leahy Scale: Fair Sitting balance - Comments: statically, arms resting on RW     Standing balance-Leahy  Scale: Poor Standing balance comment: reliant on light B UE support                           ADL either performed or assessed with clinical judgement   ADL Overall ADL's : Needs assistance/impaired     Grooming: Minimal assistance;Sitting           Upper Body Dressing : Minimal assistance;Sitting   Lower Body Dressing: Sit to/from stand;Total assistance   Toilet Transfer: +2 for physical assistance;Minimal assistance;Stand-pivot;RW   Toileting- Clothing Manipulation and Hygiene: Total assistance;Sit to/from stand         General ADL Comments: Pt drinking thickened liquid with supervision.     Vision       Perception     Praxis      Cognition Arousal/Alertness: Awake/alert Behavior During Therapy: Anxious Overall Cognitive Status: Within Functional Limits for tasks assessed                                          Exercises     Shoulder Instructions       General Comments      Pertinent Vitals/ Pain       Pain Assessment: No/denies pain  Home Living  Prior Functioning/Environment              Frequency  Min 2X/week        Progress Toward Goals  OT Goals(current goals can now be found in the care plan section)  Progress towards OT goals: Progressing toward goals  Acute Rehab OT Goals Patient Stated Goal: wanting to work on anything that will make him stronger OT Goal Formulation: With patient Time For Goal Achievement: 04/14/17 Potential to Achieve Goals: Good  Plan Discharge plan needs to be updated    Co-evaluation                 AM-PAC PT "6 Clicks" Daily Activity     Outcome Measure   Help from another person eating meals?: A Little Help from another person taking care of personal grooming?: A Lot Help from another person toileting, which includes using toliet, bedpan, or urinal?: Total Help from another person bathing (including  washing, rinsing, drying)?: A Lot Help from another person to put on and taking off regular upper body clothing?: A Little Help from another person to put on and taking off regular lower body clothing?: Total 6 Click Score: 12    End of Session Equipment Utilized During Treatment: Gait belt;Rolling walker;Oxygen  OT Visit Diagnosis: Muscle weakness (generalized) (M62.81);Unsteadiness on feet (R26.81)   Activity Tolerance Patient tolerated treatment well   Patient Left in chair;with call bell/phone within reach;with family/visitor present;with nursing/sitter in room   Nurse Communication Mobility status        Time: 0354-6568 OT Time Calculation (min): 24 min  Charges: OT General Charges $OT Visit: 1 Visit OT Treatments $Self Care/Home Management : 23-37 mins  03/14/2017 [name], OTR/L Pager: [phone]   [name] 03/14/2017, 9:49 AM

## 2017-03-14 NOTE — Consult Note (Addendum)
[facility] Nurse wound follow-up note Reason for Consult: Re-assessment of sacrum and buttocks.  Refer to previous [facility] consult notes. Wound type: Pt was previously noted to have a 8X8cm dark reddish purple area to buttocks and patchy areas of moisture associated skin damage and peeling skin and candiditas. 2 areas have evolved into unstageable pressure injuries. The moisture associated skin damage to buttocks remains, sacrum has  unstageable pressure injury; 1.8X.2cm, 100% yellow slough, mod amt tan drainage, no odor, unchanged from previous assessment.  There were previously 2 areas of full thickness wounds to right buttocks where skin has peeled; one has evolved into an unstageable pressure injury; 1X1X.1cm, 80% yellow, 20% red and moist, small amt tan drainage, no odor Pressure Injury POA: No Dressing procedure/placement/frequency: Pt has a high BMI and has multiple systemic factors which can impair healing. He is getting out of bed now and turning self better.  Continue Santyl ointment to provide enzymatic debridement of nonviable tissue and foam dressing to protect buttocks from further injury and absorb moisture and antifungal powder to moisture associated skin damage areas on buttocks.Discussed plan of care with patient and family member at the bedside. Please re-consult if further assistance is needed.  Thank-you,  Julien Girt MSN, [place], [place], [place], [place]

## 2017-03-14 NOTE — Progress Notes (Signed)
[facility] TEAM 1 - Stepdown/ICU TEAM  DIEM PAGNOTTA  AXK:[id number] DOB: April 26, 1955 DOA: 02/16/2017 PCP: [name], MD    Brief Narrative:  62y/o male with a history of lymphoma in remission (last treated in 2012) who was admitted with ARDS due to influenza, and found to have new severe systolic congestive heart failure with an EF of 25%.    Significant Events: 2/7 admit  2/8 TTE - EF 25-30% - diffuse hypokinesis - mild mitral regurg 2/9 intubated  2/14 tracheostomy  2/24 trach changed to #6 distal XLT due to cuff leak  3/1 trach downsized to #5 cuffless XLT  Subjective: The patient is resting comfortably in bed on trach collar with a Passy-Muir valve in place.  His voice is becoming more strong and is easily intelligible.  He denies chest pain shortness of breath nausea or vomiting.  He has become much more motivated to participate with physical therapy.  Assessment & Plan:  ARDS due to Influenza A   s/p trach - trach and vent care has been provided by PCCM - now off vent since 2/26 - plan to downsize trach and if tolerates will cap x48hrs toward goal of decannulation as per timing of PCCM  Acute pulmonary edema due to Acute systolic CHF (EF 78%) Cardiology has evaluated - unclear duration/new diagnosis - ?viral CM - coronary eval as outpt as per Cards - net negative ~15,000 since admit  - cont BB, ARB - cont diuretic   New onset persistent Atrial fibrillation   On eliquis - rate remains well controlled   Dysphagia  Cont to work w/ SLP - cont Cortrak for now - is presently allowed some limited modified intake w/ RN assist, which he is tolerating well - SLP to continue to work w/ pt   Hypomagnesemia Corrected   Hypokalemia Cont to supplement w/ ongoing use of diuretic   History of Non-Hodgkins lymphoma Has porta-cath in L chest   Bilateral foot wounds Wound care  DM2   CBG well controlled   Dermatitis on back v/s cellulitis  Has completed a tx course of clindamycin  - clinically much improved/essentially resolved   Deconditioning Reports that he is highly motivated to participate in rehab   Morbid obesity - Body mass index is 44.25 kg/m.   DVT prophylaxis: Eliquis  Code Status: FULL CODE Family Communication: spoke w/ wife at bedside  Disposition Plan: transfer to CIR when accepted and bed available    Consultants:  PCCM Cardiology   Antimicrobials:  Levaquin 2/3 >2/5 + 2/7  Vancomycin 2/7 > 2/12 Azactam 2/7 >2/12 Clindamycin 2/26 > 3/3  Objective: Blood pressure 127/60, pulse 69, temperature 98 F (36.7 C), temperature source Oral, resp. rate 16, height '5\' 11"'$  (1.803 m), weight (!) 143.9 kg (317 lb 3.9 oz), SpO2 92 %.  Intake/Output Summary (Last 24 hours) at 03/14/2017 0850 Last data filed at 03/14/2017 0700 Gross per 24 hour  Intake 2060 ml  Output 1770 ml  Net 290 ml   Filed Weights   03/09/17 0421 03/11/17 0328 03/12/17 1602  Weight: (!) 150 kg (330 lb 11 oz) (!) 148.2 kg (326 lb 11.6 oz) (!) 143.9 kg (317 lb 3.9 oz)    Examination: General: No acute respiratory distress - speaking clearly w/ PMV in place  Lungs: distant breath sounds th/o - no wheezing  Cardiovascular: irreg irreg - controlled - no appreciable M  Abdomen: NT/ND, morbidly obese, soft, BS+ Extremities: 1+ B LE edema w/ clear improvement gradually  CBC: Recent Labs  Lab 03/12/17 0334 03/13/17 0238  WBC 11.1* 13.0*  HGB 11.9* 12.1*  HCT 37.5* 38.8*  MCV 93.5 93.9  PLT 284 903   Basic Metabolic Panel: Recent Labs  Lab 03/09/17 1436 03/10/17 0430 03/11/17 0500 03/12/17 0334 03/13/17 0238 03/14/17 0517  NA  --  139 142 140 143 141  K 3.4* 3.2* 3.2* 3.3* 3.2* 3.7  CL  --  101 100* 100* 104 104  CO2  --  '27 27 28 30 28  '$ GLUCOSE  --  131* 76 111* 111* 110*  BUN  --  27* 28* 34* 35* 33*  CREATININE  --  0.60* 0.62 0.61 0.59* 0.60*  CALCIUM  --  8.3* 8.5* 8.3* 8.6* 8.7*  MG 1.7 1.8 2.1 2.0  --   --    GFR: Estimated Creatinine Clearance:  140.9 mL/min (A) (by C-G formula based on SCr of 0.6 mg/dL (L)).  Liver Function Tests: No results for input(s): AST, ALT, ALKPHOS, BILITOT, PROT, ALBUMIN in the last 168 hours.  HbA1C: Hgb A1c MFr Bld  Date/Time Value Ref Range Status  02/16/2017 11:00 PM 6.3 (H) 4.8 - 5.6 % Final    Comment:    (NOTE) Pre diabetes:          5.7%-6.4% Diabetes:              >6.4% Glycemic control for   <7.0% adults with diabetes     CBG: Recent Labs  Lab 03/13/17 1255 03/13/17 1955 03/13/17 2309 03/14/17 0506 03/14/17 0740  GLUCAP 144* 114* 106* 103* 120*     Scheduled Meds: . allopurinol  300 mg Per Tube Daily  . amLODipine  5 mg Per Tube Daily  . apixaban  5 mg Per Tube BID  . aspirin  81 mg Per Tube Daily  . atorvastatin  20 mg Per Tube q1800  . carvedilol  25 mg Per Tube BID WC  . chlorhexidine gluconate (MEDLINE KIT)  15 mL Mouth Rinse BID  . Chlorhexidine Gluconate Cloth  6 each Topical Daily  . collagenase   Topical Daily  . febuxostat  40 mg Per Tube Daily  . feeding supplement (PRO-STAT SUGAR FREE 64)  60 mL Per Tube TID  . furosemide  80 mg Intravenous BID  . insulin aspart  0-15 Units Subcutaneous Q4H  . ipratropium-albuterol  3 mL Nebulization TID  . losartan  50 mg Per Tube Daily  . mouth rinse  15 mL Mouth Rinse QID  . Melatonin  6 mg Per Tube QHS  . mupirocin cream   Topical Daily  . pantoprazole sodium  40 mg Per Tube Q1200  . potassium chloride  40 mEq Per Tube BID  . sertraline  50 mg Per Tube Daily  . sodium chloride flush  10-40 mL Intracatheter Q12H     LOS: 26 days   Cherene Altes, MD [facility] Office  [phone] Pager - Text Page per Amion as per below:  On-Call/Text Page:      [email or web address]      password [email or web address]  If 7PM-7AM, please contact night-coverage [email or web address] Password [email or web address] 03/14/2017, 8:50 AM

## 2017-03-14 NOTE — Procedures (Signed)
Objective Swallowing Evaluation: Type of Study: FEES-Fiberoptic Endoscopic Evaluation of Swallow   Patient Details  Name: [name] MRN: [id number] Date of Birth: 1955/07/13  Today's Date: 03/14/2017 Time: SLP Start Time (ACUTE ONLY): 1357 -SLP Stop Time (ACUTE ONLY): 1440  SLP Time Calculation (min) (ACUTE ONLY): 43 min   Past Medical History:  Past Medical History:  Diagnosis Date  . Dyslipidemia   . Gout attack 01/16/2012  . Hypertension   . nhl dx'd 01/2010  . Venous insufficiency    Past Surgical History:  Past Surgical History:  Procedure Laterality Date  . BRONCHOSCOPY    . ESOPHAGOGASTRODUODENOSCOPY ENDOSCOPY     Dr Jearld Fenton  . INGUINAL HERNIA REPAIR     2001  . PORT-A-CATH REMOVAL     HPI: 62y/o male with a history of lymphoma in remission(last treated in 2012)who was admitted with ARDS due to influenza, andfound to have new severe systolic congestive heart failure with an EF of 25%.Pt was intubated on 2/9, trach placed at bedside on 2/24. #6 distal XLT placed on 2/24 due to cuff leak. Vent setting on 2/25: CPAP/PS FiO2 40%; PEEP 8; Pt has Cortrak.    Subjective: pt alert, pleasant mood    Assessment / Plan / Recommendation  CHL IP CLINICAL IMPRESSIONS 03/14/2017  Clinical Impression Pt shows improvement since initial FEES 2/28 likely due to increased strength and decreased laryngeal edema. Oral preparation overall is functional but he does need additional time and liquid washes to clear his oral cavity with solids. He continues to have impaired timing for airway protection with thinner liquids. As a result, thin and nectar-thick liquids are penetrated to the true vocal folds without eliciting a spontaneous cough. Min cues were provided for volitional coughing which seemed to clear penetrates. Use of a chin tuck eliminated further episodes of penetration with nectar thick liquids, but with thin liquids he still had silent aspiration when he got too large of a sip -  this did not clear with cued coughing. Pt has moderate, diffuse residue after the swallow with solids, but he performs multiple swallows with Mod I to almost completely clear his pharynx. Given his quick response to swallow repeatedly, he is able to protect his airway well. Given the silent nature of his aspiration, recommend starting with Dys 2 diet and nectar thick liquids; however, SLP will f/u to introduce the water protocol to facilitate further diet advancement.  SLP Visit Diagnosis Dysphagia, pharyngeal phase (R13.13)  Attention and concentration deficit following --  Frontal lobe and executive function deficit following --  Impact on safety and function Moderate aspiration risk      CHL IP TREATMENT RECOMMENDATION 03/14/2017  Treatment Recommendations Therapy as outlined in treatment plan below     Prognosis 03/14/2017  Prognosis for Safe Diet Advancement Good  Barriers to Reach Goals --  Barriers/Prognosis Comment --    CHL IP DIET RECOMMENDATION 03/14/2017  SLP Diet Recommendations Dysphagia 2 (Fine chop) solids;Nectar thick liquid  Liquid Administration via Cup;Straw  Medication Administration Crushed with puree  Compensations Slow rate;Small sips/bites;Multiple dry swallows after each bite/sip;Chin tuck  Postural Changes Seated upright at 90 degrees      CHL IP OTHER RECOMMENDATIONS 03/14/2017  Recommended Consults --  Oral Care Recommendations Oral care QID  Other Recommendations Order thickener from pharmacy;Prohibited food (jello, ice cream, thin soups);Remove water pitcher;Have oral suction available;Place PMSV during PO intake      CHL IP FOLLOW UP RECOMMENDATIONS 03/14/2017  Follow up Recommendations Inpatient Rehab  CHL IP FREQUENCY AND DURATION 03/14/2017  Speech Therapy Frequency (ACUTE ONLY) min 2x/week  Treatment Duration 2 weeks           CHL IP ORAL PHASE 03/14/2017  Oral Phase Impaired  Oral - Pudding Teaspoon --  Oral - Pudding Cup --  Oral - Honey  Teaspoon --  Oral - Honey Cup --  Oral - Nectar Teaspoon WFL  Oral - Nectar Cup WFL  Oral - Nectar Straw WFL  Oral - Thin Teaspoon --  Oral - Thin Cup WFL  Oral - Thin Straw WFL  Oral - Puree WFL  Oral - Mech Soft Reduced posterior propulsion;Impaired mastication;Lingual/palatal residue  Oral - Regular --  Oral - Multi-Consistency --  Oral - Pill --  Oral Phase - Comment --    CHL IP PHARYNGEAL PHASE 03/14/2017  Pharyngeal Phase Impaired  Pharyngeal- Pudding Teaspoon --  Pharyngeal --  Pharyngeal- Pudding Cup --  Pharyngeal --  Pharyngeal- Honey Teaspoon --  Pharyngeal --  Pharyngeal- Honey Cup NT  Pharyngeal --  Pharyngeal- Nectar Teaspoon Reduced pharyngeal peristalsis;Reduced epiglottic inversion;Reduced laryngeal elevation;Reduced anterior laryngeal mobility;Reduced airway/laryngeal closure;Reduced tongue base retraction;Pharyngeal residue - valleculae;Pharyngeal residue - pyriform;Pharyngeal residue - posterior pharnyx  Pharyngeal --  Pharyngeal- Nectar Cup Reduced pharyngeal peristalsis;Reduced epiglottic inversion;Reduced laryngeal elevation;Reduced anterior laryngeal mobility;Reduced airway/laryngeal closure;Reduced tongue base retraction;Pharyngeal residue - valleculae;Pharyngeal residue - pyriform;Pharyngeal residue - posterior pharnyx;Penetration/Aspiration before swallow;Compensatory strategies attempted (with notebox)  Pharyngeal Material enters airway, CONTACTS cords and not ejected out  Pharyngeal- Nectar Straw Reduced pharyngeal peristalsis;Reduced epiglottic inversion;Reduced laryngeal elevation;Reduced anterior laryngeal mobility;Reduced airway/laryngeal closure;Reduced tongue base retraction;Pharyngeal residue - valleculae;Pharyngeal residue - pyriform;Pharyngeal residue - posterior pharnyx;Compensatory strategies attempted (with notebox)  Pharyngeal --  Pharyngeal- Thin Teaspoon Reduced pharyngeal peristalsis;Reduced epiglottic inversion;Reduced laryngeal  elevation;Reduced anterior laryngeal mobility;Reduced airway/laryngeal closure;Reduced tongue base retraction;Pharyngeal residue - valleculae;Pharyngeal residue - pyriform;Pharyngeal residue - posterior pharnyx;Penetration/Aspiration before swallow  Pharyngeal Material enters airway, CONTACTS cords and not ejected out  Pharyngeal- Thin Cup Reduced pharyngeal peristalsis;Reduced epiglottic inversion;Reduced laryngeal elevation;Reduced anterior laryngeal mobility;Reduced airway/laryngeal closure;Reduced tongue base retraction;Compensatory strategies attempted (with notebox)  Pharyngeal --  Pharyngeal- Thin Straw Reduced pharyngeal peristalsis;Reduced epiglottic inversion;Reduced laryngeal elevation;Reduced anterior laryngeal mobility;Reduced airway/laryngeal closure;Reduced tongue base retraction;Compensatory strategies attempted (with notebox);Penetration/Aspiration during swallow  Pharyngeal Material enters airway, passes BELOW cords without attempt by patient to eject out (silent aspiration)  Pharyngeal- Puree Reduced pharyngeal peristalsis;Reduced epiglottic inversion;Reduced laryngeal elevation;Reduced anterior laryngeal mobility;Reduced airway/laryngeal closure;Reduced tongue base retraction;Pharyngeal residue - valleculae;Pharyngeal residue - pyriform;Pharyngeal residue - posterior pharnyx  Pharyngeal --  Pharyngeal- Mechanical Soft Reduced pharyngeal peristalsis;Reduced epiglottic inversion;Reduced laryngeal elevation;Reduced anterior laryngeal mobility;Reduced airway/laryngeal closure;Reduced tongue base retraction;Pharyngeal residue - valleculae;Pharyngeal residue - pyriform;Pharyngeal residue - posterior pharnyx  Pharyngeal --  Pharyngeal- Regular --  Pharyngeal --  Pharyngeal- Multi-consistency --  Pharyngeal --  Pharyngeal- Pill --  Pharyngeal --  Pharyngeal Comment --     CHL IP CERVICAL ESOPHAGEAL PHASE 03/14/2017  Cervical Esophageal Phase WFL  Pudding Teaspoon --  Pudding Cup --   Honey Teaspoon --  Honey Cup --  Nectar Teaspoon --  Nectar Cup --  Nectar Straw --  Thin Teaspoon --  Thin Cup --  Thin Straw --  Puree --  Mechanical Soft --  Regular --  Multi-consistency --  Pill --  Cervical Esophageal Comment --    No flowsheet data found.  [name] 03/14/2017, 3:26 PM       [name], M.A. CCC-SLP [phone]

## 2017-03-15 DIAGNOSIS — L03312 Cellulitis of back [any part except buttock]: Secondary | ICD-10-CM

## 2017-03-15 LAB — GLUCOSE, CAPILLARY
GLUCOSE-CAPILLARY: 102 mg/dL — AB (ref 65–99)
GLUCOSE-CAPILLARY: 106 mg/dL — AB (ref 65–99)
GLUCOSE-CAPILLARY: 111 mg/dL — AB (ref 65–99)
Glucose-Capillary: 105 mg/dL — ABNORMAL HIGH (ref 65–99)
Glucose-Capillary: 112 mg/dL — ABNORMAL HIGH (ref 65–99)
Glucose-Capillary: 119 mg/dL — ABNORMAL HIGH (ref 65–99)
Glucose-Capillary: 133 mg/dL — ABNORMAL HIGH (ref 65–99)

## 2017-03-15 LAB — BASIC METABOLIC PANEL
Anion gap: 10 (ref 5–15)
BUN: 37 mg/dL — AB (ref 6–20)
CHLORIDE: 104 mmol/L (ref 101–111)
CO2: 28 mmol/L (ref 22–32)
Calcium: 8.8 mg/dL — ABNORMAL LOW (ref 8.9–10.3)
Creatinine, Ser: 0.67 mg/dL (ref 0.61–1.24)
GFR calc Af Amer: >60 mL/min (ref >60)
Glucose, Bld: 127 mg/dL — ABNORMAL HIGH (ref 65–99)
Potassium: 4 mmol/L (ref 3.5–5.1)
SODIUM: 142 mmol/L (ref 135–145)

## 2017-03-15 LAB — MAGNESIUM: MAGNESIUM: 1.9 mg/dL (ref 1.7–2.4)

## 2017-03-15 MED ORDER — VITAL HIGH PROTEIN PO LIQD
1000.0000 mL | ORAL | Status: DC
  Administered 2017-03-15: 1000 mL
  Filled 2017-03-15 (×3): qty 1000

## 2017-03-15 MED ORDER — LOPERAMIDE HCL 2 MG PO CAPS: 4.0000 mg | ORAL_CAPSULE | ORAL | Status: DC | PRN

## 2017-03-15 MED ORDER — PRO-STAT SUGAR FREE PO LIQD
30.0000 mL | Freq: Two times a day (BID) | ORAL | Status: DC
  Administered 2017-03-16 – 2017-03-22 (×11): 30 mL
  Filled 2017-03-15 (×14): qty 30

## 2017-03-15 NOTE — Progress Notes (Addendum)
PROGRESS NOTE    [name]  WYO:[id number] DOB: 12/01/1955 DOA: 02/16/2017 PCP: [name], MD   Brief Narrative:  [name] PMHx lymphoma in remission (last treated in 2012), HTN, dyslipidemia, gout  Admitted with ARDS due to influenza, and found to have new severe systolic congestive heart failure with an EF of 25%.     Subjective: 3/6 A/O 4, negative CP, negative abdominal pain, negative N/V. Positive diarrhea(most likely secondary to tube feeds). States did not work with physical therapy today secondary to uncontrolled diarrhea. However yesterday work with them twice during the day, spent some time in the chair.       Assessment & Plan:   Active Problems:   Respiratory failure, unspecified with hypoxia (HCC)   Acute respiratory failure (HCC)   Acute respiratory failure with hypoxemia (HCC)   Status post tracheostomy ([place])   Pressure injury of skin   Hypoxic   Tracheostomy status ([place])   Community acquired pneumonia   Critical illness myopathy   Dysphagia   Persistent atrial fibrillation (HCC)   Acute systolic congestive heart failure (HCC)   Non-Hodgkin's lymphoma (HCC)   Benign essential HTN   Leukocytosis   Hypokalemia  ARDS/influenza A pneumonia -S/P trach -Patient has been off vent since 2/26. -Aggressive pulmonary toilet -Titrate O2 to maintain SPO2> 93% -DuoNeb TID -Frequent tracheal suctioning PRN  Acute Systolic CHF -LVEF = 77% -Strict in and out since admission -15.0 L -Daily weight Filed Weights   03/11/17 0328 03/12/17 1602 03/15/17 0424  Weight: (!) 326 lb 11.6 oz (148.2 kg) (!) 317 lb 3.9 oz (143.9 kg) (!) 310 lb 13.6 oz (141 kg)  -Cardiology following -Viral cardiomyopathy? Coronary eval per cardiology. -Amlodipine 5 mg daily -ASA 81 mg daily -Coreg 25 mg BID -Lasix  80 mg BID -Losartan 50 mg daily  New onset atrial fibrillation -Rate controlled -Eliquis 5 mg BID  Acute pulmonary edema -See systolic CHF   Dysphagia    -Scheduled for  FEES 2/28  -Currently  CorTrak in place.  - Vital High Potein 53m/hr, pro-stat sugar free 64 TID   Hypomagnesemia -Magnesium goal> 2   Hypokalemia -Potassium goal> 4   Non-Hodgkin's lymphoma -Port-A-Cath in LEFT chest -Per patient and wife in remission.  Bilateral foot wounds -Per wound care   DM Type 2   controlled with complication -2/7 Hemoglobin A1c = 6.3  -Moderate SSI  Cellulitis of back -Complete course of antibiotics. Using clindamycin secondary to patient's PCN allergy -Will need to monitor closely for C. difficile   Extreme DECONDITIONING  -Will require extensive rehabilitation. Unsure patient will qualify for CIR but will have patient evaluated. -Spoke at length with wife and patient today understands must participate with PT/OT or will not qualify for CIR or SNF    Anxiety/depression -Ativan PRN -Melatonin 6 mg daily -Zoloft 50 mg daily   Morbid obesity -Body mass index is 44.12 kg/m. -    DVT prophylaxis: Eliquis Code Status: Full Family Communication: Wife at bedside for discussion of plan care Disposition Plan:  CIR  Vs SNF?   [facility]    Procedures/Significant Events:  2/7 admit  2/8 TTE - EF 25-30% - diffuse hypokinesis - mild mitral regurg 2/9 intubated  2/14 tracheostomy  2/24 trach changed to #6 distal XLT due to cuff leak  3/1 trach change to #6 proximal XLT cuffless   I have personally reviewed and interpreted all radiology studies and my findings are as above.  VENTILATOR SETTINGS:  Cultures     Antimicrobials: Anti-infectives (From admission, onward)   Start     Stop   03/08/17 1200  clindamycin (CLEOCIN) capsule 300 mg     03/12/17 1759   03/07/17 1800  clindamycin (CLEOCIN) capsule 300 mg  Status:  Discontinued     03/08/17 1003   02/20/17 1400  aztreonam (AZACTAM) 1 g in sodium chloride 0.9 % 100 mL IVPB  Status:  Discontinued     02/21/17 1020   02/19/17 0800   vancomycin (VANCOCIN) 1,500 mg in sodium chloride 0.9 % 500 mL IVPB  Status:  Discontinued     02/21/17 1020   02/18/17 2200  oseltamivir (TAMIFLU) 6 MG/ML suspension 75 mg     02/21/17 2112   02/17/17 1000  oseltamivir (TAMIFLU) capsule 75 mg  Status:  Discontinued     02/18/17 1412   02/17/17 0600  vancomycin (VANCOCIN) 1,250 mg in sodium chloride 0.9 % 250 mL IVPB  Status:  Discontinued     02/19/17 0720   02/17/17 0200  aztreonam (AZACTAM) 1 g in dextrose 5 % 50 mL IVPB  Status:  Discontinued     02/20/17 0844   02/16/17 1845  aztreonam (AZACTAM) 2 g in dextrose 5 % 50 mL IVPB     02/16/17 1957   02/16/17 1830  vancomycin (VANCOCIN) 2,500 mg in sodium chloride 0.9 % 500 mL IVPB     02/16/17 2320   02/16/17 1745  levofloxacin (LEVAQUIN) IVPB 750 mg     02/16/17 1957       Devices    LINES / TUBES:  Trach change to #6 proximal XLT cuffless>>> 3/6 3/6 Trach changed to #4 uncuffed>>     Continuous Infusions: . feeding supplement (VITAL HIGH PROTEIN) 1,000 mL (03/13/17 1609)     Objective: Vitals:   03/15/17 0005 03/15/17 0346 03/15/17 0424 03/15/17 0730  BP: 97/61 100/72  110/66  Pulse: 73 69    Resp:      Temp: 99.4 F (37.4 C) 97.9 F (36.6 C)  98.3 F (36.8 C)  TempSrc: Oral Oral  Oral  SpO2: 91% 92%  92%  Weight:   (!) 310 lb 13.6 oz (141 kg)   Height:        Intake/Output Summary (Last 24 hours) at 03/15/2017 0737 Last data filed at 03/15/2017 0600 Gross per 24 hour  Intake 1920 ml  Output 900 ml  Net 1020 ml   Filed Weights   03/11/17 0328 03/12/17 1602 03/15/17 0424  Weight: (!) 326 lb 11.6 oz (148.2 kg) (!) 317 lb 3.9 oz (143.9 kg) (!) 310 lb 13.6 oz (141 kg)     Physical Exam:  General: A/O 4, positive No acute respiratory distress (now on trach collar) Neck:  Negative scars, masses, torticollis, lymphadenopathy, JVD, #6 cuffless trach now in place Lungs:  Clear to auscultation bilaterally without wheezes or crackles Cardiovascular:  Regular  rhythm and rate without murmur gallop or rub normal S1 and S2 Abdomen: MORBIDLY obese, negative abdominal pain, nondistended, positive soft, bowel sounds, no rebound, no ascites, no appreciable mass Extremities: No significant cyanosis, clubbing. 2+ pitting edema bilateral lower extremity improving. Skin: Negative rashes, lesions, ulcers Psychiatric:  Negative depression, negative anxiety, negative fatigue, negative mania  Central nervous system:  Cranial nerves II through XII intact, tongue/uvula midline, all extremities muscle strength 5/5, sensation intact throughout, negative dysarthria, negative expressive aphasia, negative receptive aphasia      Data Reviewed: Care during the described time interval was provided  by me .  I have reviewed this patient's available data, including medical history, events of note, physical examination, and all test results as part of my evaluation.   CBC: Recent Labs  Lab 03/12/17 0334 03/13/17 0238  WBC 11.1* 13.0*  HGB 11.9* 12.1*  HCT 37.5* 38.8*  MCV 93.5 93.9  PLT 284 166   Basic Metabolic Panel: Recent Labs  Lab 03/09/17 1436 03/10/17 0430 03/11/17 0500 03/12/17 0334 03/13/17 0238 03/14/17 0517 03/15/17 0350  NA  --  139 142 140 143 141 142  K 3.4* 3.2* 3.2* 3.3* 3.2* 3.7 4.0  CL  --  101 100* 100* 104 104 104  CO2  --  _0 GLUCOSE  --  131* 76 111* 111* 110* 127*  BUN  --  27* 28* 34* 35* 33* 37*  CREATININE  --  0.60* 0.62 0.61 0.59* 0.60* 0.67  CALCIUM  --  8.3* 8.5* 8.3* 8.6* 8.7* 8.8*  MG 1.7 1.8 2.1 2.0  --   --  1.9   GFR: Estimated Creatinine Clearance: 139.3 mL/min (by C-G formula based on SCr of 0.67 mg/dL). Liver Function Tests: No results for input(s): AST, ALT, ALKPHOS, BILITOT, PROT, ALBUMIN in the last 168 hours. No results for input(s): LIPASE, AMYLASE in the last 168 hours. No results for input(s): AMMONIA in the last 168 hours. Coagulation Profile: No results for input(s): INR, PROTIME in the  last 168 hours. Cardiac Enzymes: No results for input(s): CKTOTAL, CKMB, CKMBINDEX, TROPONINI in the last 168 hours. BNP (last 3 results) No results for input(s): PROBNP in the last 8760 hours. HbA1C: No results for input(s): HGBA1C in the last 72 hours. CBG: Recent Labs  Lab 03/14/17 0740 03/14/17 1152 03/14/17 1704 03/15/17 0044 03/15/17 0343  GLUCAP 120* 112* 113* 112* 106*   Lipid Profile: No results for input(s): CHOL, HDL, LDLCALC, TRIG, CHOLHDL, LDLDIRECT in the last 72 hours. Thyroid Function Tests: No results for input(s): TSH, T4TOTAL, FREET4, T3FREE, THYROIDAB in the last 72 hours. Anemia Panel: No results for input(s): VITAMINB12, FOLATE, FERRITIN, TIBC, IRON, RETICCTPCT in the last 72 hours. Urine analysis: No results found for: COLORURINE, APPEARANCEUR, LABSPEC, PHURINE, GLUCOSEU, HGBUR, BILIRUBINUR, KETONESUR, PROTEINUR, UROBILINOGEN, NITRITE, LEUKOCYTESUR Sepsis Labs: _1 (procalcitonin:4,lacticidven:4)  )No results found for this or any previous visit (from the past 240 hour(s)).       Radiology Studies: Dg Chest Port 1 View  Result Date: 03/13/2017 CLINICAL DATA:  Patient diagnosed with pneumonia approximately 1 month ago. No current complaints. EXAM: PORTABLE CHEST 1 VIEW COMPARISON:  Single-view of the chest 03/01/2017, 02/28/2017 and 02/16/2017. FINDINGS: Tracheostomy tube, feeding tube, left subclavian catheter and right PICC are unchanged since the most recent examination. Patchy bilateral airspace disease persists. Aeration shows some improvement since 02/27/2017 but is unchanged since the most recent exam. There is cardiomegaly. No pneumothorax. IMPRESSION: Patchy bilateral airspace disease appears unchanged since the most recent study but has improved somewhat since 02/27/2017. Electronically Signed   By: Inge Rise M.D.   On: 03/13/2017 16:34        Scheduled Meds: . allopurinol  300 mg Per Tube Daily  . amLODipine  5 mg Per Tube  Daily  . apixaban  5 mg Per Tube BID  . aspirin  81 mg Per Tube Daily  . atorvastatin  20 mg Per Tube q1800  . carvedilol  25 mg Per Tube BID WC  . chlorhexidine gluconate (MEDLINE KIT)  15 mL Mouth Rinse BID  .  Chlorhexidine Gluconate Cloth  6 each Topical Daily  . collagenase   Topical Daily  . febuxostat  40 mg Per Tube Daily  . feeding supplement (PRO-STAT SUGAR FREE 64)  60 mL Per Tube TID  . furosemide  80 mg Oral BID  . insulin aspart  0-15 Units Subcutaneous Q4H  . ipratropium-albuterol  3 mL Nebulization BID  . losartan  50 mg Per Tube Daily  . mouth rinse  15 mL Mouth Rinse QID  . Melatonin  6 mg Per Tube QHS  . mupirocin cream   Topical Daily  . pantoprazole sodium  40 mg Per Tube Q1200  . potassium chloride  40 mEq Per Tube BID  . sertraline  50 mg Per Tube Daily  . sodium chloride flush  10-40 mL Intracatheter Q12H   Continuous Infusions: . feeding supplement (VITAL HIGH PROTEIN) 1,000 mL (03/13/17 1609)     LOS: 27 days    Time spent: 40 minutes    Ashonti Leandro, Geraldo Docker, MD [facility] Pager [phone]   If 7PM-7AM, please contact night-coverage [email or web address] Password [facility] 03/15/2017, 7:37 AM

## 2017-03-15 NOTE — Progress Notes (Signed)
Nutrition Follow-up  DOCUMENTATION CODES:   Morbid obesity  INTERVENTION:    Vital High Protein formula at goal rate of 60 ml/hr  Prostat liquid protein 30 ml BID  Provides 1640 kcal, 156 gm protein, 1204 ml free water daily  NUTRITION DIAGNOSIS:   Inadequate oral intake related to inability to eat as evidenced by NPO status, ongoing  GOAL:   Patient will meet greater than or equal to 90% of their needs, met  MONITOR:   TF tolerance, PO intake, Labs, Skin, Weight trends, I & O's  ASSESSMENT:   61 yo male with PMH of HTN, venous insufficiency, HLD, gout, NHL (in remission, last treated in 2012) who was admitted on 2/7 with acute hypoxemia respiratory failure, influenza A, and pulmonary edema. Required intubation on 2/9.  2/27 Cortrak placed 2/28 FEES, SLP rec NPO 3/05 repeat FEES, advanced to Dys 2-Nectar thick liquids  Pt is currently on trach collar. Reports his breakfast wasn't very good. PO intake is approximately 30% per flowsheets. Vital High Protein formula infusing at goal rate of 50 ml/hr via Cortrak feeding tube. Labs and medications reviewed. CBG's 106-111-119.  Diet Order:  DIET DYS 2 Room service appropriate? Yes; Fluid consistency: Nectar Thick  EDUCATION NEEDS:   No education needs have been identified at this time  Skin:  Skin Assessment: Skin Integrity Issues: Skin Integrity Issues:: Stage II, DTI DTI: coccyx Stage II: R buttocks Other: venous stasis ulcer to R ankle and L toe  Last BM:  3/6   Intake/Output Summary (Last 24 hours) at 03/15/2017 1503 Last data filed at 03/15/2017 1148 Gross per 24 hour  Intake 1543 ml  Output 3125 ml  Net -1582 ml   Height:   Ht Readings from Last 1 Encounters:  02/27/17 5' 11" (1.803 m)   Weight: >>> fluctuating but stable  Wt Readings from Last 1 Encounters:  03/15/17 (!) 310 lb 13.6 oz (141 kg)   Ideal Body Weight:  78.18 kg  BMI:  Body mass index is 43.35 kg/m.  Estimated Nutritional Needs:    Kcal:  1700-1900  Protein:  145-160 gm  Fluid:  2 L  Katie Lamberton, RD, LDN Pager #: [phone] After-Hours Pager #: [phone]  

## 2017-03-15 NOTE — Progress Notes (Signed)
PT Cancellation Note  Patient Details Name: [name] MRN: [id number] DOB: [birth year]   Cancelled Treatment:    Reason Eval/Treat Not Completed: Medical issues which prohibited therapy(Pt nauseated. Will try to return and mobilize later today.  ) Nursing can get pt OOB later today as well.  Thanks.   [place] 03/15/2017, 9:30 AM [facility] [phone] (pager)

## 2017-03-15 NOTE — Progress Notes (Signed)
PULMONARY / CRITICAL CARE MEDICINE   Name: [name] MRN: [id number] DOB: September 15, 1955    ADMISSION DATE:  02/16/2017 CONSULTATION DATE:  02/16/2017  REFERRING MD:  Regenia Skeeter  CHIEF COMPLAINT:  Dyspnea  HISTORY OF PRESENT ILLNESS:   62y/o male with a history of lymphoma in remission, last treated in 2012 who was admitted with ARDS due to influenza , found to have new severe systolic congestive heart failure with an EF of 25%  SUBJECTIVE: Tolerated TC overnight.  STUDIES:  2/8 echo > EF 25-30% moderately dilated left ventricle, diffuse hypokinesis mild mitral valve regurgitation, left atrium mildly dilated  CULTURES: 2/7 BCx 2 >> negative 2/7 RVP >> influenza A 02/18/2017 BAL>> negative  ANTIBIOTICS: Levaquin 2/3 >2/5; 2/7 x 1 Vancomycin 2/7 >> 2/12 Azactam 2/7 >> 2/12 Tamiflu 2/8 > completed   SIGNIFICANT EVENTS: 2/7 Admit 02/18/2017 intubated for acute respiratory failure 02/18/2017 trach 2/25 tx sdu/trach changed  LINES/TUBES: Left chest port >> accessed 2/7 02/18/2017 orotracheal tube>>2/14 02/18/2017 orogastric tube>>2/14 Trach 2/14>>> changed to #6 shiley XLT distal 2/24  3/3 changed to xlt 5   Subjective Continues to improve VITAL SIGNS: BP 119/81   Pulse 85   Temp 98.3 F (36.8 C) (Oral)   Resp 18   Ht _0  (1.803 m)   Wt (!) 310 lb 13.6 oz (141 kg)   SpO2 91%   BMI 43.35 kg/m   HEMODYNAMICS:    VENTILATOR SETTINGS: FiO2 (%):  [40 %-60 %] 60 % Filed Weights   03/11/17 0328 03/12/17 1602 03/15/17 0424  Weight: (!) 326 lb 11.6 oz (148.2 kg) (!) 317 lb 3.9 oz (143.9 kg) (!) 310 lb 13.6 oz (141 kg)   INTAKE / OUTPUT:  Intake/Output Summary (Last 24 hours) at 03/15/2017 1131 Last data filed at 03/15/2017 1101 Gross per 24 hour  Intake 1543 ml  Output 2175 ml  Net -632 ml   6 L PHYSICAL EXAMINATION: General: 62 year old white male lying in bed  no acute distress HEENT: Neck is large, no clear jugular venous distention however this is not assessable  given body habitus now has a #4 cuffless tracheostomy in place.  This is capped with an occlusive trach cover, he saturating in the mid to low 90s his phonation quality is excellent Pulmonary: Clear to auscultation with occasional rhonchi that clear with cough he is able to expectorate out of his mouth Cardiac: Regular irregular Abdomen: Obese, soft, nontender Extremities: Warm and dry dependent edema Neuro/psych awake oriented no focal deficits  LABS:  BMET Recent Labs  Lab 03/13/17 0238 03/14/17 0517 03/15/17 0350  NA 143 141 142  K 3.2* 3.7 4.0  CL 104 104 104  CO2 _1 BUN 35* 33* 37*  CREATININE 0.59* 0.60* 0.67  GLUCOSE 111* 110* 127*    CBC Recent Labs  Lab 03/12/17 0334 03/13/17 0238  WBC 11.1* 13.0*  HGB 11.9* 12.1*  HCT 37.5* 38.8*  PLT 284 257    Imaging I reviewed CXR myself, trach is in good position    ASSESSMENT / PLAN:  Acute respiratory failure  Hypoxemia (on-going) Influenza ARDS New systolic CM EF 80% -->? Viral CM AF Tracheostomy status Dysphagia  H/o NH Lymphoma  Bilateral foot wounds  DM type II Dermatitis on back MO  Probable OSA Severe deconditioning    Discussion  62 year old [place] White male admitted initially w/ acute respiratory failure d/t Influenza w/ resultant ARDS. Course was c/b new systolic CM (EF 99%) Pulmonary edema and  new onset AF. He failed extubation while in ICU and subsequently had trach placed. He is now recovering. Was changed to 5 prox XLT on 3/4. He still is hypoxic, think that this is a mix of residual ALI/ARDS after influenza AND atelectasis d/t his body habitus.   inteval hx 3/1 to 3/4:  3/1: trach changed to cuffless XLT.  3/1: using PMV; SLP recommending NPO status d/t delayed swallow  3/4 changed to 5xlt prox, still on 80% but can be weaned 3/5 am abg to look for hypercarbia negative.  ABG    Component Value Date/Time   PHART 7.471 (H) 03/14/2017 0420   PCO2ART 42.7 03/14/2017 0420   PO2ART  64.1 (L) 03/14/2017 0420   HCO3 30.7 (H) 03/14/2017 0420   TCO2 32 03/01/2017 1109   ACIDBASEDEF 2.0 02/18/2017 1213   O2SAT 92.3 03/14/2017 0420  3/6: still on high oxygen. Changed to 4 cuffless.   Plan/rec  Maximize activity Wean oxygen for sats >88% Cont lasix as BUN/cr allow  #4 cuffless trach cap during day and open to ATC at HS & PRN. He needs a PSG before he can be decannulated As I suspect he needs CPAP at [place] ACNP-BC [place] Pager # [phone] OR # [phone] if no answer

## 2017-03-15 NOTE — Progress Notes (Signed)
Noted medical updates per Laurey Arrow NP, PCCM. I continue to follow his progress. [phone]

## 2017-03-15 NOTE — Progress Notes (Signed)
CSW continuing to follow and assist with SNF if pt is not appropriate for transfer to CIR- continues to have too high of O2 requirements at this time for CIR or SNF  Jorge Ny, [place] Worker [phone]

## 2017-03-15 NOTE — Progress Notes (Signed)
  Speech Language Pathology Treatment: Dysphagia  Patient Details Name: [name] MRN: [id number] DOB: Sep 28, 1955 Today's Date: 03/15/2017 Time: 5277-8242 SLP Time Calculation (min) (ACUTE ONLY): 22 min  Assessment / Plan / Recommendation Clinical Impression  Pt seen with trials of ice chips and puree without overt s/s concerning for aspiration. Pt used chin tuck and multiple swallow strategies with minimal verbal/visual cues. Given likely tolerance of ice chip trials with chin tuck per testing on previous date, recommend initiating ice chips per water protocol, allowing ice chips in between meals and following oral care. Recommend continued nectar thick liquid and Dys 2 chopped diet, using multiple swallows and chin tuck strategies and general aspiration precautions. SLP will continue to follow to determine continued diet appropriateness and assess for readiness to repeat instrumental testing.      HPI HPI: 62y/o male with a history of lymphoma in remission(last treated in 2012)who was admitted with ARDS due to influenza, andfound to have new severe systolic congestive heart failure with an EF of 25%.Pt was intubated on 2/9, trach placed at bedside on 2/24. #6 distal XLT placed on 2/24 due to cuff leak. Vent setting on 2/25: CPAP/PS FiO2 40%; PEEP 8; Pt has Cortrak.       SLP Plan  Continue with current plan of care       Recommendations  Diet recommendations:Dysphagia 2 (fine chop);Nectar-thick liquid;Other(comment) (ice chips per protocol) Liquids provided via: Cup;Straw Medication Administration: Crushed with puree Supervision: Patient able to self feed;Full supervision/cueing for compensatory strategies Compensations: Slow rate;Small sips/bites;Multiple dry swallows after each bite/sip;Chin tuck Postural Changes and/or Swallow Maneuvers: Seated upright 90 degrees;Upright 30-60 min after meal      Patient may use Passy-Muir Speech Valve: During all therapies with  supervision;During all waking hours (remove during sleep) PMSV Supervision: Intermittent MD: Please consider changing trach tube to : Smaller size         Oral Care Recommendations: Oral care BID;Oral care prior to ice chip/H20 Follow up Recommendations: Inpatient Rehab SLP Visit Diagnosis: Dysphagia, pharyngeal phase (R13.13) Plan: Continue with current plan of care       GO                [name] 03/15/2017, 12:10 PM  [name] SLP Student Clinician

## 2017-03-16 LAB — BASIC METABOLIC PANEL
Anion gap: 10 (ref 5–15)
BUN: 31 mg/dL — ABNORMAL HIGH (ref 6–20)
CHLORIDE: 99 mmol/L — AB (ref 101–111)
CO2: 27 mmol/L (ref 22–32)
Calcium: 8.6 mg/dL — ABNORMAL LOW (ref 8.9–10.3)
Creatinine, Ser: 0.68 mg/dL (ref 0.61–1.24)
Glucose, Bld: 183 mg/dL — ABNORMAL HIGH (ref 65–99)
POTASSIUM: 3.9 mmol/L (ref 3.5–5.1)
SODIUM: 136 mmol/L (ref 135–145)

## 2017-03-16 LAB — CBC
HEMATOCRIT: 39.6 % (ref 39.0–52.0)
Hemoglobin: 12.4 g/dL — ABNORMAL LOW (ref 13.0–17.0)
MCH: 29.3 pg (ref 26.0–34.0)
MCHC: 31.3 g/dL (ref 30.0–36.0)
MCV: 93.6 fL (ref 78.0–100.0)
Platelets: 228 10*3/uL (ref 150–400)
RBC: 4.23 MIL/uL (ref 4.22–5.81)
RDW: 16.1 % — AB (ref 11.5–15.5)
WBC: 13.3 10*3/uL — AB (ref 4.0–10.5)

## 2017-03-16 LAB — GLUCOSE, CAPILLARY
GLUCOSE-CAPILLARY: 104 mg/dL — AB (ref 65–99)
GLUCOSE-CAPILLARY: 121 mg/dL — AB (ref 65–99)
GLUCOSE-CAPILLARY: 105 mg/dL — AB (ref 65–99)
GLUCOSE-CAPILLARY: 110 mg/dL — AB (ref 65–99)
Glucose-Capillary: 101 mg/dL — ABNORMAL HIGH (ref 65–99)
Glucose-Capillary: 127 mg/dL — ABNORMAL HIGH (ref 65–99)
Glucose-Capillary: 147 mg/dL — ABNORMAL HIGH (ref 65–99)

## 2017-03-16 LAB — MAGNESIUM: MAGNESIUM: 1.7 mg/dL (ref 1.7–2.4)

## 2017-03-16 MED ORDER — MAGNESIUM SULFATE 2 GM/50ML IV SOLN
2.0000 g | Freq: Once | INTRAVENOUS | Status: AC
  Administered 2017-03-16: 2 g via INTRAVENOUS
  Filled 2017-03-16: qty 50

## 2017-03-16 NOTE — PMR Pre-admission (Signed)
PMR Admission Coordinator Pre-Admission Assessment  Patient: [name] is an 62 y.o., male MRN: [id number] DOB: [birth year] Height: _0  (180.3 cm) Weight: (!) 140.6 kg (309 lb 15.5 oz)              Insurance Information HMO:     PPO:      PCP:      IPA:      80/20:      OTHER: ACA policy, blue advantage silver PRIMARY: BCBS [place]      Policy#: [name]      Subscriber: pt CM Name: Harrie Jeans    Phone#: [phone]     Fax#: [phone] Pre-Cert#: [id number] approved through 3/20 when updates are due      Employer: self Benefits:  Phone #: [phone]     Name: 3/7 Eff. Date: 01/10/2017     Deduct: $4000      Out of Pocket Max: $7900 includes deductible      Life Max: none CIR: 70%      SNF: 70% 60 days Outpatient: $40 per visit     Co-Pay: 30 visits each discipline Home Health: 70%      Co-Pay: visits per medical neccesity DME: 70%     Co-Pay: 30% Providers: in network  SECONDARY: none        Medicaid Application Date:       Case Manager:  Disability Application Date:       Case Worker:   Emergency Facilities manager Information    Name Relation Home Work Mobile   [place] Sister  [phone]     Current Medical History  Patient Admitting Diagnosis: CIM  History of Present Illness:    : HPI: [name] a 62 y.o.right handed malewith history of non-Hodgkin's lymphoma stage III last treated with chemotherapy and radiation 2012 and has a Port-A-Cath in his left chest, hypertension, chronic bilateral foot wound with ischemic changes.Presented 02/16/2017 with ARDS due to influenza. Patient did require intubation and tracheostomy 02/23/2017  and downsized to a #4 with trach being capped. He is currently usingPMVfollowed by pulmonary services.Recorder continuous )2 sat monitor last pm 03/21/2017. Dr. Nelda Marseille plans to de cannulate today prior to admit to CIR and use of CPAP at CIR [facility]. Will need outpatient sleep study later d/c and follow up  with PCCM . Latest blood cultures negative. Tamiflu completed 02/17/2017. Hospital course treated for severe systolic congestive heart failure with noted ejection fraction of 25% moderately dilated left ventricle, diffuse hypokinesis. New onset persistent atrial fibrillation follow-up cardiology services placed on Eliquis. Diet has been advanced to a dysphagia #2 nectar thick liquid. WOCfollow-up for skin care as directed or sacrum and buttocks and chronic foot wounds.   Past Medical History  Past Medical History:  Diagnosis Date  . Dyslipidemia   . Gout attack 01/16/2012  . Hypertension   . nhl dx'd 01/2010  . Venous insufficiency     Family History  family history is not on file.  Prior Rehab/Hospitalizations:  Has the patient had major surgery during 100 days prior to admission? No  Current Medications   Current Facility-Administered Medications:  .  acetaminophen (TYLENOL) solution 650 mg, 650 mg, Per Tube, Q6H PRN, Yopp, Amber C, RPH, 650 mg at 03/12/17 1830 .  albuterol (PROVENTIL) (2.5 MG/3ML) 0.083% nebulizer solution 2.5 mg, 2.5 mg, Nebulization, Q4H PRN, Cherene Altes, MD .  allopurinol (ZYLOPRIM) tablet 300 mg, 300 mg, Per Tube, Daily, Thereasa Solo Kimberlee Nearing, MD, 300 mg  at 03/16/17 0912 .  amLODipine (NORVASC) tablet 5 mg, 5 mg, Per Tube, Daily, Simonne Maffucci B, MD, 5 mg at 03/16/17 0912 .  apixaban (ELIQUIS) tablet 5 mg, 5 mg, Per Tube, BID, Simonne Maffucci B, MD, 5 mg at 03/16/17 0912 .  aspirin chewable tablet 81 mg, 81 mg, Per Tube, Daily, Erick Colace, NP, 81 mg at 03/16/17 0912 .  atorvastatin (LIPITOR) tablet 20 mg, 20 mg, Per Tube, q1800, Erick Colace, NP, 20 mg at 03/15/17 1715 .  butalbital-acetaminophen-caffeine (FIORICET, ESGIC) 50-325-40 MG per tablet 1-2 tablet, 1-2 tablet, Per Tube, Q6H PRN, Cherene Altes, MD, 1 tablet at 03/08/17 2318 .  carvedilol (COREG) tablet 25 mg, 25 mg, Per Tube, BID WC, Kathyrn Drown D, NP, 25 mg at 03/16/17 0912 .   chlorhexidine gluconate (MEDLINE KIT) (PERIDEX) 0.12 % solution 15 mL, 15 mL, Mouth Rinse, BID, Rush Farmer, MD, 15 mL at 03/16/17 0913 .  Chlorhexidine Gluconate Cloth 2 % PADS 6 each, 6 each, Topical, Daily, Cherene Altes, MD, 6 each at 03/15/17 0940 .  collagenase (SANTYL) ointment, , Topical, Daily, Joette Catching T, MD .  febuxostat (ULORIC) tablet 40 mg, 40 mg, Per Tube, Daily, Cherene Altes, MD, 40 mg at 03/16/17 0911 .  feeding supplement (PRO-STAT SUGAR FREE 64) liquid 30 mL, 30 mL, Per Tube, BID, Allie Bossier, MD, 30 mL at 03/16/17 0911 .  feeding supplement (VITAL HIGH PROTEIN) liquid 1,000 mL, 1,000 mL, Per Tube, Continuous, Allie Bossier, MD, Stopped at 03/16/17 0800 .  furosemide (LASIX) tablet 80 mg, 80 mg, Oral, BID, Cherene Altes, MD, 80 mg at 03/16/17 0912 .  hydrALAZINE (APRESOLINE) injection 10-40 mg, 10-40 mg, Intravenous, Q4H PRN, Rigoberto Noel, MD .  ibuprofen (ADVIL,MOTRIN) 100 MG/5ML suspension 600 mg, 600 mg, Per Tube, Q6H PRN, Cherene Altes, MD, 600 mg at 03/13/17 1820 .  insulin aspart (novoLOG) injection 0-15 Units, 0-15 Units, Subcutaneous, Q4H, Collene Gobble, MD, 2 Units at 03/16/17 0019 .  loperamide (IMODIUM) capsule 4 mg, 4 mg, Oral, PRN, Allie Bossier, MD .  LORazepam (ATIVAN) 2 MG/ML concentrated solution 0.5-1 mg, 0.5-1 mg, Per Tube, Q4H PRN, Cherene Altes, MD, 1 mg at 03/10/17 2158 .  losartan (COZAAR) tablet 50 mg, 50 mg, Per Tube, Daily, Minus Breeding, MD, 50 mg at 03/16/17 0912 .  MEDLINE mouth rinse, 15 mL, Mouth Rinse, QID, Rush Farmer, MD, 15 mL at 03/16/17 0333 .  Melatonin TABS 6 mg, 6 mg, Per Tube, QHS, Cherene Altes, MD, 6 mg at 03/15/17 2120 .  mupirocin cream (BACTROBAN) 2 %, , Topical, Daily, Hammonds, Sharyn Blitz, MD .  ondansetron [facility]) injection 4 mg, 4 mg, Intravenous, Q6H PRN, Mauri Brooklyn, MD, 4 mg at 02/18/17 1275 .  oxyCODONE (ROXICODONE) 5 MG/5ML solution 5-7.5 mg, 5-7.5 mg, Per Tube,  Q3H PRN, Cherene Altes, MD, 5 mg at 03/15/17 0900 .  pantoprazole sodium (PROTONIX) 40 mg/20 mL oral suspension 40 mg, 40 mg, Per Tube, Q1200, Erick Colace, NP, 40 mg at 03/15/17 1245 .  potassium chloride 20 MEQ/15ML (10%) solution 40 mEq, 40 mEq, Per Tube, BID, Cherene Altes, MD, 40 mEq at 03/16/17 0911 .  RESOURCE THICKENUP CLEAR, , Oral, PRN, Allie Bossier, MD .  sertraline (ZOLOFT) 20 MG/ML concentrated solution 50 mg, 50 mg, Per Tube, Daily, Cherene Altes, MD, 50 mg at 03/16/17 0911 .  sodium chloride flush (NS) 0.9 % injection  10-40 mL, 10-40 mL, Intracatheter, Q12H, McQuaid, Douglas B, MD, 10 mL at 03/16/17 1610  Facility-Administered Medications Ordered in Other Encounters:  .  sodium chloride 0.9 % injection 10 mL, 10 mL, Intravenous, PRN, Curt Bears, MD, 10 mL at 05/04/15 1529  Patients Current Diet: DIET DYS 2 Room service appropriate? 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 cane as needed with his gout flares  Prior Functional Level Prior Function Level of Independence: Independent Comments: Independent; used cane on occasion due to gout  Self Care: Did the patient need help bathing, dressing, using the toilet or eating?  Independent  Indoor Mobility: Did the patient need assistance with walking from room to room (with or without device)? Independent  Stairs: Did the patient need assistance with internal or external stairs  (with or without device)? Independent  Functional Cognition: Did the patient need help planning regular tasks such as shopping or remembering to take medications? Independent  Current Functional Level Cognition  Overall Cognitive Status: Within Functional Limits for tasks assessed Difficult to assess due to: Tracheostomy Orientation Level: Oriented X4 General Comments: follows commands, some tangential comments, pt with low volume, difficult to understand at times    Extremity Assessment (includes Sensation/Coordination)  Upper Extremity Assessment: Generalized weakness RUE Deficits / Details: moving both arms and hands grossly 2/5-3/5 LUE Deficits / Details: moving both arms and hands grossly 2/5-3/5  Lower Extremity Assessment: RLE deficits/detail, LLE deficits/detail RLE Deficits / Details: pt with random spontaneous movements through partial ROM for hip abduction/adduction; no response to noxious stimuli LLE Deficits / Details: pt with random spontaneous movements through partial ROM for hip abduction/adduction; no response to noxious stimuli    ADLs  Overall ADL's : Needs assistance/impaired Eating/Feeding: NPO Grooming: Minimal assistance, Sitting Upper Body Bathing: Maximal assistance, Bed level Lower Body Bathing: Total assistance, Bed level Upper Body Dressing : Minimal assistance, Sitting Lower Body Dressing: Sit to/from stand, Total assistance Toilet Transfer: +2 for physical assistance, Minimal assistance, Stand-pivot, RW Toileting- Clothing Manipulation and Hygiene: Total assistance, Sit to/from stand General ADL Comments: Pt drinking thickened liquid with supervision.    Mobility  Overal bed mobility: Needs Assistance Bed Mobility: Rolling, Sidelying to Sit Rolling: Mod assist Sidelying to sit: Mod assist Supine to sit: +2 for physical assistance, Mod assist Sit to supine: Max assist, +2 for physical assistance General bed mobility comments: increased time, assist  for LEs over EOB and to raise trunk    Transfers  Overall transfer level: Needs assistance Equipment used: Rolling walker (2 wheeled) Transfers: Sit to/from Stand, Stand Pivot Transfers Sit to Stand: +2 physical assistance, Min assist, From elevated surface Stand pivot transfers: +2 physical assistance, Min assist Squat pivot transfers: +2 physical assistance, Max assist(3rd person present for safety. ) General transfer comment: stood with assist to steady and use of momentum, R hand on RW with L hand pushing up from elevated bed    Ambulation / Gait / Stairs / Wheelchair Mobility       Posture / Balance Dynamic Sitting Balance Sitting balance -  Comments: statically, arms resting on RW Balance Overall balance assessment: Needs assistance Sitting-balance support: Single extremity supported, Feet unsupported Sitting balance-Leahy Scale: Fair Sitting balance - Comments: statically, arms resting on RW Postural control: Posterior lean, Left lateral lean Standing balance support: Bilateral upper extremity supported, During functional activity Standing balance-Leahy Scale: Poor Standing balance comment: reliant on light B UE support    Special needs/care consideration BiPAP/CPAP New to CPAP at HS on CIR. Will need outpatient sleep study after d/c and follow up with PCCM CPM  N/a Continuous Drip IV  N/a Dialysis  N/a Life Vest  N/a Oxygen ) 2 at 3 liters nasal cannula. Not used pta.  Special Bed n/a Trach Size to be decannulate today 3/13 prior to admit to CIR. Wound Vac n/a Skin  RN  Registered Nurse  WOC  Consult Note  Addendum  Date of Service:  03/14/2017 11:26 AM               _0 Hide copied text  _1 Hover for details   [place] Nurse wound follow-up note Reason for Consult:Re-assessment of sacrum and buttocks. Refer to previous [facility] notes. Wound type:Pt was previously noted to have a 8X8cm dark reddish purple area to buttocks and patchy areas of moisture  associated skin damage and peeling skinand candidiasis. 2 areas have evolved into unstageable pressure injuries. The moisture associated skin damage to buttocks remains, sacrum has  unstageable pressure injury; 1.8X.2cm, 100% yellow slough, mod amt tan drainage, no odor, unchanged from previous assessment. There were previously 2 areas of full thickness wounds to right buttocks where skin has peeled; one has evolved into an unstageable pressure injury; 1X1X.1cm, 80% yellow, 20% red and moist, small amt tan drainage, no odor Pressure Injury POA: No Dressing procedure/placement/frequency:Pt has a high BMI and has multiple systemic factors which can impair healing. He is getting out of bed now and turning self better. Continue Santyl ointment to provide enzymatic debridement of nonviable tissue and foam dressing to protect buttocks from further injury and absorb moisture and antifungal powder to moisture associated skin damage areas on buttocks.Discussed plan of care with patient and family member at the bedside. Please re-consult if further assistance is needed. Gae Dry MSN, RN, [place], [place], [place]         Jeannie Fend, RN  Registered Nurse  WOC  Consult Note  Addendum  Date of Service:  03/20/2017 12:50 PM               _2 Hide copied text  _3 Hover for details   [place] Nurse wound follow up Reason for Consult:Re-assessment ofsacrum and buttocks. Refer to previous [facility] notes. Wound type:2 unstageable pressure injuries. Sacrum:  2 cm x 2 cm 90% yellow slough, 10% pale pink, tan drainage The moisture associated skin damageto buttocksremains unchanged from previous assessment.  Pressure Injury POA: No Dressing procedure/placement/frequency:Impaired wound healing. He is getting out of bed now and turning self better. Today is asleep in bed and does not assist much.  ContinueSantyl ointment to provide enzymatic debridement of nonviable  tissue and foam dressing to protect buttocks from further injury and absorb moistureand antifungal powder to moisture associated skin damage areas on buttocks.  Orders updated today.  Will not follow at this time.  Please re-consult if needed.  Domenic Moras RN BSN CWON Pager [phone]          Revision History  Routing History                Bowel mgmt: some incontinence with diarrhea Bladder mgmt: continent with urinal use Diabetic mgmt  N/a   Previous Home Environment Living Arrangements: Alone  Lives With: Alone Available Help at Discharge: Family, Friend(s), Available 24 hours/day(sister, [name] Art and friend, Jenny Reichmann to assist 24/7) Type of Home: Other(Comment)(two level townhome) Home Layout: Two level, 1/2 bath on main level, Bed/bath upstairs(pt is checking into chair lift for his stairs to second floo) Alternate Level Stairs-Rails: Right Alternate Level Stairs-Number of Steps: 6 steps then a landing and then 6 more steps Home Access: Stairs to enter Entrance Stairs-Rails: None Entrance Stairs-Number of Steps: 1 Bathroom Shower/Tub: Public librarian, Multimedia programmer: Standard Bathroom Accessibility: Yes How Accessible: Accessible via walker Turner: No Additional Comments: verified with pt and friend, Cindy  Discharge Living Setting Plans for Discharge Living Setting: Patient's home, Alone Type of Home at Discharge: Other (Comment)(two level townhome) Discharge Home Layout: Two level, 1/2 bath on main level, Bed/bath upstairs Alternate Level Stairs-Rails: Right Alternate Level Stairs-Number of Steps: 6 steps. a landing and then 6 more steps Discharge Home Access: Stairs to enter Entrance Stairs-Rails: None Entrance Stairs-Number of Steps: 1 Discharge Bathroom Shower/Tub: Tub/shower unit, Horticulturist, commercial: Standard Discharge Bathroom Accessibility: Yes How Accessible: Accessible via  walker Does the patient have any problems obtaining your medications?: No  Social/Family/Support Systems Patient Roles: (buisiness owner) Contact Information: sister, [name] Art, is main contact Anticipated Caregiver: sister, [name] Art, and friend, Programmer, systems Anticipated Ambulance person Information: see above Ability/Limitations of Caregiver: no limitations Caregiver Availability: 24/7 Discharge Plan Discussed with Primary Caregiver: Yes Is Caregiver In Agreement with Plan?: Yes Does Caregiver/Family have Issues with Lodging/Transportation while Pt is in Rehab?: No  Goals/Additional Needs Patient/Family Goal for Rehab: supervision to min assist with PT, OT, and SLP Expected length of stay: ELOS 18 to 22 days Pt/Family Agrees to Admission and willing to participate: Yes Program Orientation Provided & Reviewed with Pt/Caregiver Including Roles  & Responsibilities: Yes  Decrease burden of Care through IP rehab admission: n/a  Possible need for SNF placement upon discharge: not anticipated  Patient Condition: This patient's medical and functional status has changed since the consult dated: 03/13/2017 in which the Rehabilitation Physician determined and documented that the patient's condition is appropriate for intensive rehabilitative care in an inpatient rehabilitation facility. See "History of Present Illness" (above) for medical update. Functional changes are: overall min to mod assist. Patient's medical and functional status update has been discussed with the Rehabilitation physician and patient remains appropriate for inpatient rehabilitation. Will admit to inpatient rehab today.  Preadmission Screen Completed By:  Cleatrice Burke, 03/16/2017 1:24 PM ______________________________________________________________________   Discussed status with Dr. Posey Pronto on 03/22/2017 at  1053 and received telephone approval for admission today.  Admission Coordinator:  Cleatrice Burke, time [id number] Date  03/22/2017

## 2017-03-16 NOTE — Progress Notes (Addendum)
  Speech Language Pathology Treatment: Dysphagia  Patient Details Name: [name] MRN: [id number] DOB: Jun 05, 1955 Today's Date: 03/16/2017 Time: 6213-0865 SLP Time Calculation (min) (ACUTE ONLY): 28 min  Assessment / Plan / Recommendation Clinical Impression  Pt seen with Nectar thick liquidS consistent with current diet recommendation. Pt had no overt s/s concerning for aspiration, requiring min verbal cues to maintain chin tuck throughout trials. Pt recalled ice chip protocol with min assist from FRIEND, and reported no difficulty swallowing/coughing with ice ships. Also, per pt and RN, pt consumed more PO today for breakfast and was working on increasing PO intake in general. Therefore, continue to recommend Dys 2 and Nectar thick liquid diet with aspiration precautions, ice chip protocol, and chin tuck for all swallows. Would consider d/c cortrak as soon as possible, but particularly before consideration for repeat testing. Will consider a repeat FEES to upgrade with continued tolerance of current diet and potential decannulation.    HPI HPI: 62y/o male with a history of lymphoma in remission(last treated in 2012)who was admitted with ARDS due to influenza, andfound to have new severe systolic congestive heart failure with an EF of 25%.Pt was intubated on 2/9, trach placed at bedside on 2/24. #6 distal XLT placed on 2/24 due to cuff leak. Changed to Shiley 4 uncuffed placed on 3/6. Capped 3/7. Pt has a Cortrak.       SLP Plan  Continue with current plan of care       Recommendations  Diet recommendations: Dysphagia 2 (fine chop);Nectar-thick liquid Liquids provided via: Straw;Cup Medication Administration: Crushed with puree Supervision: Patient able to self feed;Full supervision/cueing for compensatory strategies Compensations: Slow rate;Small sips/bites;Multiple dry swallows after each bite/sip;Chin tuck Postural Changes and/or Swallow Maneuvers: Seated upright 90  degrees;Upright 30-60 min after meal                Oral Care Recommendations: Oral care BID;Oral care prior to ice chip/H20 Follow up Recommendations: Inpatient Rehab SLP Visit Diagnosis: Dysphagia, pharyngeal phase (R13.13) Plan: Continue with current plan of care       GO               [place] Simara Rhyner SLP Student Clinician  [place] Keighley Deckman 03/16/2017, 12:21 PM

## 2017-03-16 NOTE — Progress Notes (Signed)
PULMONARY / CRITICAL CARE MEDICINE   Name: [name] MRN: [id number] DOB: 1955-06-22    ADMISSION DATE:  02/16/2017 CONSULTATION DATE:  02/16/2017  REFERRING MD:  Regenia Skeeter  CHIEF COMPLAINT:  Dyspnea  HISTORY OF PRESENT ILLNESS:   62y/o male with a history of lymphoma in remission, last treated in 2012 who was admitted with ARDS due to influenza , found to have new severe systolic congestive heart failure with an EF of 25%  SUBJECTIVE: Doing well with trach capping.  STUDIES:  2/8 echo > EF 25-30% moderately dilated left ventricle, diffuse hypokinesis mild mitral valve regurgitation, left atrium mildly dilated  CULTURES: 2/7 BCx 2 >> negative 2/7 RVP >> influenza A 02/18/2017 BAL>> negative  ANTIBIOTICS: Levaquin 2/3 >2/5; 2/7 x 1 Vancomycin 2/7 >> 2/12 Azactam 2/7 >> 2/12 Tamiflu 2/8 > completed   SIGNIFICANT EVENTS: 2/7 Admit 02/18/2017 intubated for acute respiratory failure 02/18/2017 trach 2/25 tx sdu/trach changed 3/1: trach changed to cuffless XLT.  3/1: using PMV; SLP recommending NPO status d/t delayed swallow  3/4 changed to 5xlt prox, still on 80% but can be weaned  LINES/TUBES: Left chest port >> accessed 2/7 02/18/2017 orotracheal tube>>2/14 02/18/2017 orogastric tube>>2/14 Trach 2/14>>> changed to #6 shiley XLT distal 2/24  3/3 changed to xlt 5  VITAL SIGNS: BP (!) 90/54 (BP Location: Left Wrist)   Pulse 78   Temp 97.9 F (36.6 C) (Axillary)   Resp (!) 23   Ht _0  (1.803 m)   Wt (!) 309 lb 15.5 oz (140.6 kg)   SpO2 93%   BMI 43.23 kg/m   INTAKE / OUTPUT: I/O last 3 completed shifts: In: 2070 [I.V.:3; Other:10; NG/GT:2057] Out: 3150 [Urine:3150]  PHYSICAL EXAMINATION:  General - pleasant Eyes - pupils reactive ENT - no stridor, trach with cap, mild irritation around trach site Cardiac - regular, no murmur Chest - no wheeze, rales Abd - soft, non tender Ext - no edema Skin - no rashes Neuro - normal strength Psych - normal  mood   LABS:  BMET Recent Labs  Lab 03/14/17 0517 03/15/17 0350 03/16/17 0848  NA 141 142 136  K 3.7 4.0 3.9  CL 104 104 99*  CO2 _1 BUN 33* 37* 31*  CREATININE 0.60* 0.67 0.68  GLUCOSE 110* 127* 183*    CBC Recent Labs  Lab 03/12/17 0334 03/13/17 0238 03/16/17 0848  WBC 11.1* 13.0* 13.3*  HGB 11.9* 12.1* 12.4*  HCT 37.5* 38.8* 39.6  PLT 284 257 228    ABG    Component Value Date/Time   PHART 7.471 (H) 03/14/2017 0420   PCO2ART 42.7 03/14/2017 0420   PO2ART 64.1 (L) 03/14/2017 0420   HCO3 30.7 (H) 03/14/2017 0420   TCO2 32 03/01/2017 1109   ACIDBASEDEF 2.0 02/18/2017 1213   O2SAT 92.3 03/14/2017 0420    Imaging Dg Chest Port 1 View  Result Date: 03/13/2017 CLINICAL DATA:  Patient diagnosed with pneumonia approximately 1 month ago. No current complaints. EXAM: PORTABLE CHEST 1 VIEW COMPARISON:  Single-view of the chest 03/01/2017, 02/28/2017 and 02/16/2017. FINDINGS: Tracheostomy tube, feeding tube, left subclavian catheter and right PICC are unchanged since the most recent examination. Patchy bilateral airspace disease persists. Aeration shows some improvement since 02/27/2017 but is unchanged since the most recent exam. There is cardiomegaly. No pneumothorax. IMPRESSION: Patchy bilateral airspace disease appears unchanged since the most recent study but has improved somewhat since 02/27/2017. Electronically Signed   By: Inge Rise M.D.   On: 03/13/2017  16:34    ASSESSMENT / PLAN:  Acute hypoxic respiratory failure in setting of Influenza PNA with ARDS. S/p tracheostomy. Presumed sleep disordered breathing. 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 - per primary team  Dysphagia. - f/u with speech therapy  Deconditioning. - being assessed for CIR  Updated pt's wife at bedside.  PCCM will f/u on Monday  03/21/17 >> call if help needed sooner.  Chesley Mires, MD [facility] Pulmonary/Critical Care 03/16/2017, 1:42 PM Pager:  [phone] After 3pm call: [phone]

## 2017-03-16 NOTE — Progress Notes (Signed)
Occupational Therapy Treatment Patient Details Name: [name] MRN: [id number] DOB: [birth year] Today's Date: 03/16/2017    History of present illness Pt is a 62 y/o male history of lymphoma in remission admitted with acute on chronic hypoxemic and hypercapnic respiratory failure due to influenza A.  He was also noted to have a new LVEF of 25% with contribution of cardiogenic edema.  He failed extubation on 2/14.  Based on his need for continued ventilatory support he underwent percutaneous tracheostomy on 2/14.   OT comments  This 62 yo male seen for theraband exercises. Initiated on RUE with PT then in to see pt with +2 help. Will return to finish instructing on RUE and do LUE as well.  Follow Up Recommendations  CIR    Equipment Recommendations  Other (comment)(TBD next venue)       Precautions / Restrictions Precautions Precautions: Fall Precaution Comments: trach, cortrak Restrictions Weight Bearing Restrictions: No              ADL either performed or assessed with clinical judgement                        Exercises Other Exercises Other Exercises: Instructed pt and wife in Level 2 theraband exercises for RUE (10 reps each of bicep curl and elbow extension)           Pertinent Vitals/ Pain       Pain Assessment: No/denies pain Faces Pain Scale: Hurts even more Pain Location: bottom (sitting in chair) Pain Descriptors / Indicators: Grimacing;Discomfort Pain Intervention(s): Limited activity within patient's tolerance;Repositioned;Monitored during session         Frequency  Min 2X/week        Progress Toward Goals  OT Goals(current goals can now be found in the care plan section)  Progress towards OT goals: Progressing toward goals     Plan Discharge plan remains appropriate       AM-PAC PT "6 Clicks" Daily Activity     Outcome Measure   Help from another person eating meals?: A Little Help from another person taking care of personal  grooming?: A Little Help from another person toileting, which includes using toliet, bedpan, or urinal?: Total Help from another person bathing (including washing, rinsing, drying)?: A Lot Help from another person to put on and taking off regular upper body clothing?: A Lot Help from another person to put on and taking off regular lower body clothing?: Total 6 Click Score: 12    End of Session Equipment Utilized During Treatment: Oxygen(4 liters)  OT Visit Diagnosis: Muscle weakness (generalized) (M62.81)   Activity Tolerance Patient tolerated treatment well   Patient Left in bed(with PT getting ready to get pt up)   Nurse Communication          Time: 6237-6283 OT Time Calculation (min): 16 min  Charges: OT General Charges $OT Visit: 1 Visit OT Treatments $Therapeutic Exercise: 8-22 mins  Golden Circle, OTR/L [phone] 03/16/2017

## 2017-03-16 NOTE — Progress Notes (Addendum)
Patient has eaten 100% of breakfast and wishes to have TF stopped, MD made aware via text page  1830: MD Sherral Hammers at bedside, verbal order to remove cortrak

## 2017-03-16 NOTE — Progress Notes (Signed)
Physical Therapy Treatment Patient Details Name: [name] MRN: [id number] DOB: 1955/05/26 Today's Date: 03/16/2017    History of Present Illness Pt is a 62 y/o male history of lymphoma in remission admitted with acute on chronic hypoxemic and hypercapnic respiratory failure due to influenza A.  He was also noted to have a new LVEF of 25% with contribution of cardiogenic edema.  He failed extubation on 2/14.  Based on his need for continued ventilatory support he underwent percutaneous tracheostomy on 2/14.    PT Comments    Pt admitted with above diagnosis. Pt currently with functional limitations due to balance and endurance deficits. Pt was able to ambulate short distances with chair follow with incr O2 needs during walk up to 15LHFNC.  Will continue, progressing wwell.  Pt will benefit from skilled PT to increase their independence and safety with mobility to allow discharge to the venue listed below.     Follow Up Recommendations  CIR;Supervision/Assistance - 24 hour     Equipment Recommendations  None recommended by PT    Recommendations for Other Services       Precautions / Restrictions Precautions Precautions: Fall Precaution Comments: trach capped and on 4LO2, cortrak Restrictions Weight Bearing Restrictions: No    Mobility  Bed Mobility Overal bed mobility: Needs Assistance Bed Mobility: Rolling;Sidelying to Sit Rolling: Mod assist Sidelying to sit: Mod assist Supine to sit: +2 for physical assistance;Mod assist     General bed mobility comments: increased time, assist for LEs over EOB and to raise trunk  Transfers Overall transfer level: Needs assistance Equipment used: Rolling walker (2 wheeled) Transfers: Sit to/from [facility] Sit to Stand: +2 physical assistance;Mod assist         General transfer comment: stood with assist to steady and use of momentum, R hand on RW with L hand pushing up from elevated bed. Needed assist to power up  and steadying assist once standing .  Ambulation/Gait Ambulation/Gait assistance: Mod assist;+2 safety/equipment Ambulation Distance (Feet): 17 Feet(6 feet, 5 feet then 6 feet) Assistive device: Rolling walker (2 wheeled) Gait Pattern/deviations: Step-through pattern;Decreased stride length;Trunk flexed;Wide base of support   Gait velocity interpretation: Below normal speed for age/gender General Gait Details: Pt able to take steps today although knees instability noted at times.  Pt fatigues quickly due to weakness and DOE4/4 with incr O2 needs with activity.  Initially on 4LO2 but had to incr to 6LO2 with activity with desat to 87% with exertion.  Ended up placing on HFNC at 15L for last walk and pt sats stayed higher >90% with HR up to 130 bpm last walk. Followed closely with chair. .    Stairs            Wheelchair Mobility    Modified Rankin (Stroke Patients Only)       Balance Overall balance assessment: Needs assistance Sitting-balance support: Single extremity supported;Feet unsupported Sitting balance-Leahy Scale: Fair Sitting balance - Comments: statically, arms resting on RW   Standing balance support: Bilateral upper extremity supported;During functional activity Standing balance-Leahy Scale: Poor Standing balance comment: reliant on heavy B UE support when he fatigues                            Cognition Arousal/Alertness: Awake/alert Behavior During Therapy: Anxious Overall Cognitive Status: Within Functional Limits for tasks assessed  General Comments: follows commands, some tangential comments, pt with low volume, difficult to understand at times      Exercises General Exercises - Lower Extremity Ankle Circles/Pumps: AROM;Both;20 reps;Supine Quad Sets: AROM;Both;10 reps;Supine Long Arc Quad: AROM;Both;10 reps;Seated Hip Flexion/Marching: AROM;Both;10 reps;Seated Other Exercises Other  Exercises: Instructed pt and wife in Level 2 theraband exercises for RUE (10 reps each of bicep curl and elbow extension)    General Comments General comments (skin integrity, edema, etc.): Trach capped with sats 96% initially on 4L.  See above for sats with activyt.       Pertinent Vitals/Pain Pain Assessment: No/denies pain Faces Pain Scale: Hurts even more Pain Location: bottom (sitting in chair) Pain Descriptors / Indicators: Grimacing;Discomfort Pain Intervention(s): Limited activity within patient's tolerance;Repositioned;Monitored during session    Home Living   Living Arrangements: Alone Available Help at Discharge: Family;Friend(s);Available 24 hours/day(sister, [name] and friend, [name] to assist 24/7) Type of Home: Other(Comment)(two level townhome) Home Access: Stairs to enter Entrance Stairs-Rails: None Home Layout: Two level;1/2 bath on main level;Bed/bath upstairs(pt is checking into chair lift for his stairs to second floo)   Additional Comments: verified with pt and friend, [name]    Prior Function        Comments: Independent; used cane on occasion due to gout   PT Goals (current goals can now be found in the care plan section) Progress towards PT goals: Progressing toward goals    Frequency    Min 3X/week      PT Plan Current plan remains appropriate    Co-evaluation              AM-PAC PT "6 Clicks" Daily Activity  Outcome Measure  Difficulty turning over in bed (including adjusting bedclothes, sheets and blankets)?: A Lot Difficulty moving from lying on back to sitting on the side of the bed? : A Lot Difficulty sitting down on and standing up from a chair with arms (e.g., wheelchair, bedside commode, etc,.)?: A Lot Help needed moving to and from a bed to chair (including a wheelchair)?: A Lot Help needed walking in hospital room?: A Lot Help needed climbing 3-5 steps with a railing? : Total 6 Click Score: 11    End of Session Equipment  Utilized During Treatment: (trach collar) Activity Tolerance: Patient limited by fatigue Patient left: with call bell/phone within reach;with family/visitor present;in chair Nurse Communication: Mobility status;Need for lift equipment PT Visit Diagnosis: Other abnormalities of gait and mobility (R26.89)     Time: 1000-1038 PT Time Calculation (min) (ACUTE ONLY): 38 min  Charges:  $Gait Training: 23-37 mins $Therapeutic Activity: 8-22 mins                    G Codes:       [place] [phone] (pager)    Denice Paradise 03/16/2017, 1:54 PM

## 2017-03-16 NOTE — Progress Notes (Signed)
Occupational Therapy Treatment Patient Details Name: [name] MRN: [id number] DOB: 18-Jul-1955 Today's Date: 03/16/2017    History of present illness Pt is a 62 y/o male history of lymphoma in remission admitted with acute on chronic hypoxemic and hypercapnic respiratory failure due to influenza A.  He was also noted to have a new LVEF of 25% with contribution of cardiogenic edema.  He failed extubation on 2/14.  Based on his need for continued ventilatory support he underwent percutaneous tracheostomy on 2/14.   OT comments  This 62 yo male admitted with above presents to acute OT with making progress towards goals, with Bil UE theraband exercise goal met. Goals to be revised today. Pt will continue to benefit from acute OT with now recommendation to CIR for pt to work back towards PLOF.   Follow Up Recommendations  CIR    Equipment Recommendations  Other (comment)(TBD at next venue)       Precautions / Restrictions Precautions Precautions: Fall Precaution Comments: trach capped and on 4LO2, cortrak Restrictions Weight Bearing Restrictions: No                              Cognition Arousal/Alertness: Awake/alert Behavior During Therapy: Anxious Overall Cognitive Status: Within Functional Limits for tasks assessed                                      Exercises Exercises: General Lower Extremity;Other exercises General Exercises - Lower Extremity Ankle Circles/Pumps: AROM;Both;20 reps;Supine Quad Sets: AROM;Both;10 reps;Supine Long Arc Quad: AROM;Both;10 reps;Seated Hip Flexion/Marching: AROM;Both;10 reps;Seated Other Exercises Other Exercises: Instructed pt and wife in Level 2 and Level 1 theraband exercises for RUE/LUE (10 reps each of bicep curl, elbow extension for LUE; horizontal ab/adduction and diagonals for LUE/RUE; AROM no resitance left shoulder punch towards celing, and RUE assisting LUE for shoulder punch towards ceiling--all in a supine  position with HOB up). Provided handout for pt as reference        Pertinent Vitals/ Pain       Pain Assessment: No/denies pain     Prior Functioning/Environment              Frequency  Min 2X/week        Progress Toward Goals  OT Goals(current goals can now be found in the care plan section)  Progress towards OT goals: Progressing toward goals     Plan Discharge plan remains appropriate       AM-PAC PT "6 Clicks" Daily Activity     Outcome Measure   Help from another person eating meals?: A Little Help from another person taking care of personal grooming?: A Little Help from another person toileting, which includes using toliet, bedpan, or urinal?: Total Help from another person bathing (including washing, rinsing, drying)?: A Lot Help from another person to put on and taking off regular upper body clothing?: A Lot Help from another person to put on and taking off regular lower body clothing?: Total 6 Click Score: 12    End of Session Equipment Utilized During Treatment: Oxygen(4 liters)  OT Visit Diagnosis: Muscle weakness (generalized) (M62.81);Other abnormalities of gait and mobility (R26.89)   Activity Tolerance Patient tolerated treatment well   Patient Left in bed           Time: 2330-0762 OT Time Calculation (min): 22 min  Charges: OT General  Charges $OT Visit: 1 Visit OT Treatments $Therapeutic Exercise: 8-22 mins  Golden Circle, OTR/L [phone] 03/16/2017

## 2017-03-16 NOTE — Progress Notes (Signed)
I met with patient and his friend, Jenny Reichmann, at bedside. He is making good progress. I am hopeful for admit to CIR early next week pending insurance approval when pt medically ready. They are in agreement with plan. [phone]

## 2017-03-16 NOTE — Progress Notes (Signed)
PROGRESS NOTE    [name]  ZSW:[id number] DOB: [birth year] DOA: 02/16/2017 PCP: [name], MD   Brief Narrative:  [name] PMHx lymphoma in remission (last treated in 2012), HTN, dyslipidemia, gout  Admitted with ARDS due to influenza, and found to have new severe systolic congestive heart failure with an EF of 25%.     Subjective: 3/7 A/O 4, negative CP, negative abdominal pain, negative N/V.Participated with PT/OT. Was able to ambulate to room door and back. Consumed 75% of each meal today.    Assessment & Plan:   Active Problems:   Respiratory failure, unspecified with hypoxia (HCC)   Acute respiratory failure (HCC)   Acute respiratory failure with hypoxemia (HCC)   Status post tracheostomy ([place])   Pressure injury of skin   Hypoxic   Tracheostomy status ([place])   Community acquired pneumonia   Critical illness myopathy   Dysphagia   Persistent atrial fibrillation (HCC)   Acute systolic congestive heart failure (HCC)   Non-Hodgkin's lymphoma (HCC)   Benign essential HTN   Leukocytosis   Hypokalemia  ARDS/influenza A pneumonia -S/P trach -Patient has been off vent since 2/26. -continued aggressive pulmonary toilet -titrate O2 to maintain SPO2> 93% -albuterol -frequent tracheal suctioning PRN  Acute Systolic CHF -LVEF = 32% -Strict in and out since admission -17.4 L -Daily weight Filed Weights   03/12/17 1602 03/15/17 0424 03/16/17 0334  Weight: (!) 317 lb 3.9 oz (143.9 kg) (!) 310 lb 13.6 oz (141 kg) (!) 309 lb 15.5 oz (140.6 kg)  -Cardiology following -Viral cardiomyopathy? Coronary eval per cardiology. -amlodipine 5 mg daily -ASA 81 mg daily -Coreg 25 mgBID -Lasix 80 mg BID -losartan 50 mg daily  New onset atrial fibrillation -Rate controlled -Eliquis 5 mg BID  Acute pulmonary edema -See systolic CHF   Dysphagia  -Scheduled for  FEES 2/28  -significantly improved DC CorTrak tube.  - DC tube feed   Hypomagnesemia -Magnesium goal>  2 -Magnesium IV 2 g   Hypokalemia -Potassium goal> 4   Non-Hodgkin's lymphoma -Port-A-Cath in LEFT chest -Per patient and wife in remission.  Bilateral foot wounds -Per wound care   DM Type 2   controlled with complication -2/7 Hemoglobin A1c = 6.3  -Moderate SSI  Cellulitis of back -Completed course of antibiotics. -Will need to monitor closely for C. difficile   Extreme DECONDITIONING  -patient continues to improve may qualify for CIR.  Anxiety/depression -Ativan PRN -Melatonin 6 mg daily -Zoloft 50 mg daily   Morbid obesity -Body mass index is 44.12 kg/m. -    DVT prophylaxis: Eliquis Code Status: Full Family Communication: Wife at bedside for discussion of plan care Disposition Plan:  CIR  Vs SNF?   Consultants:  [facility]    Procedures/Significant Events:  2/7 admit  2/8 TTE - EF 25-30% - diffuse hypokinesis - mild mitral regurg 2/9 intubated  2/14 tracheostomy  2/24 trach changed to #6 distal XLT due to cuff leak  3/1 trach change to #6 proximal XLT cuffless   I have personally reviewed and interpreted all radiology studies and my findings are as above.  VENTILATOR SETTINGS:    Cultures     Antimicrobials: Anti-infectives (From admission, onward)   Start     Stop   03/08/17 1200  clindamycin (CLEOCIN) capsule 300 mg     03/12/17 1150   03/07/17 1800  clindamycin (CLEOCIN) capsule 300 mg  Status:  Discontinued     03/08/17 1003   02/20/17 1400  aztreonam (  AZACTAM) 1 g in sodium chloride 0.9 % 100 mL IVPB  Status:  Discontinued     02/21/17 1020   02/19/17 0800  vancomycin (VANCOCIN) 1,500 mg in sodium chloride 0.9 % 500 mL IVPB  Status:  Discontinued     02/21/17 1020   02/18/17 2200  oseltamivir (TAMIFLU) 6 MG/ML suspension 75 mg     02/21/17 2112   02/17/17 1000  oseltamivir (TAMIFLU) capsule 75 mg  Status:  Discontinued     02/18/17 1412   02/17/17 0600  vancomycin (VANCOCIN) 1,250 mg in sodium chloride 0.9 % 250 mL  IVPB  Status:  Discontinued     02/19/17 0720   02/17/17 0200  aztreonam (AZACTAM) 1 g in dextrose 5 % 50 mL IVPB  Status:  Discontinued     02/20/17 0844   02/16/17 1845  aztreonam (AZACTAM) 2 g in dextrose 5 % 50 mL IVPB     02/16/17 1957   02/16/17 1830  vancomycin (VANCOCIN) 2,500 mg in sodium chloride 0.9 % 500 mL IVPB     02/16/17 2320   02/16/17 1745  levofloxacin (LEVAQUIN) IVPB 750 mg     02/16/17 1957        Devices    LINES / TUBES:  Left chest port >> accessed 2/7 02/18/2017 orotracheal tube>>2/14 02/18/2017 orogastric tube>>2/14 Trach 2/14>>> changed to #6 shiley XLT distal 2/24  3/3 changed to xlt 5 Trach change to #6 proximal XLT cuffless>>> 3/6 3/6 Trach changed to #4 uncuffed>>     Continuous Infusions: . feeding supplement (VITAL HIGH PROTEIN) 1,000 mL (03/15/17 1713)     Objective: Vitals:   03/16/17 0325 03/16/17 0334 03/16/17 0740 03/16/17 0743  BP:   125/82 125/85  Pulse:    72  Resp:   20   Temp:   98.4 F (36.9 C)   TempSrc:   Oral   SpO2: 94%  90% 92%  Weight:  (!) 309 lb 15.5 oz (140.6 kg)    Height:        Intake/Output Summary (Last 24 hours) at 03/16/2017 0846 Last data filed at 03/16/2017 0500 Gross per 24 hour  Intake 780 ml  Output 2725 ml  Net -1945 ml   Filed Weights   03/12/17 1602 03/15/17 0424 03/16/17 0334  Weight: (!) 317 lb 3.9 oz (143.9 kg) (!) 310 lb 13.6 oz (141 kg) (!) 309 lb 15.5 oz (140.6 kg)     Physical Exam:  General: A/O 4, positive No acute respiratory distress (now on downsized trach collar) Neck:  Negative scars, masses, torticollis, lymphadenopathy, JVD, #4 cuffless trach now in place Lungs: Clear to auscultation bilaterally without wheezes or crackles Cardiovascular:  Regular rhythm without murmur gallop or rub normal S1 and S2 Abdomen: MORBIDLY obese, negative abdominal pain, nondistended, positive soft, bowel sounds, no rebound, no ascites, no appreciable mass Extremities: No significant cyanosis,  clubbing. 1+ pitting edema bilateral lower extremity improving. Skin: Negative rashes, lesions, ulcers Psychiatric:  Negative depression, negative anxiety, negative fatigue, negative mania  Central nervous system:  Cranial nerves II through XII intact, tongue/uvula midline, all extremities muscle strength 5/5, sensation intact throughout, negative dysarthria, negative expressive aphasia, negative receptive aphasia      Data Reviewed: Care during the described time interval was provided by me .  I have reviewed this patient's available data, including medical history, events of note, physical examination, and all test results as part of my evaluation.   CBC: Recent Labs  Lab 03/12/17 0334 03/13/17 0238  WBC 11.1* 13.0*  HGB 11.9* 12.1*  HCT 37.5* 38.8*  MCV 93.5 93.9  PLT 284 161   Basic Metabolic Panel: Recent Labs  Lab 03/09/17 1436  03/10/17 0430 03/11/17 0500 03/12/17 0334 03/13/17 0238 03/14/17 0517 03/15/17 0350  NA  --    < > 139 142 140 143 141 142  K 3.4*  --  3.2* 3.2* 3.3* 3.2* 3.7 4.0  CL  --    < > 101 100* 100* 104 104 104  CO2  --    < > _0 GLUCOSE  --    < > 131* 76 111* 111* 110* 127*  BUN  --    < > 27* 28* 34* 35* 33* 37*  CREATININE  --    < > 0.60* 0.62 0.61 0.59* 0.60* 0.67  CALCIUM  --    < > 8.3* 8.5* 8.3* 8.6* 8.7* 8.8*  MG 1.7  --  1.8 2.1 2.0  --   --  1.9   < > = values in this interval not displayed.   GFR: Estimated Creatinine Clearance: 139.1 mL/min (by C-G formula based on SCr of 0.67 mg/dL). Liver Function Tests: No results for input(s): AST, ALT, ALKPHOS, BILITOT, PROT, ALBUMIN in the last 168 hours. No results for input(s): LIPASE, AMYLASE in the last 168 hours. No results for input(s): AMMONIA in the last 168 hours. Coagulation Profile: No results for input(s): INR, PROTIME in the last 168 hours. Cardiac Enzymes: No results for input(s): CKTOTAL, CKMB, CKMBINDEX, TROPONINI in the last 168 hours. BNP (last 3  results) No results for input(s): PROBNP in the last 8760 hours. HbA1C: No results for input(s): HGBA1C in the last 72 hours. CBG: Recent Labs  Lab 03/15/17 1617 03/15/17 1923 03/15/17 2348 03/16/17 0325 03/16/17 0738  GLUCAP 105* 133* 127* 101* 104*   Lipid Profile: No results for input(s): CHOL, HDL, LDLCALC, TRIG, CHOLHDL, LDLDIRECT in the last 72 hours. Thyroid Function Tests: No results for input(s): TSH, T4TOTAL, FREET4, T3FREE, THYROIDAB in the last 72 hours. Anemia Panel: No results for input(s): VITAMINB12, FOLATE, FERRITIN, TIBC, IRON, RETICCTPCT in the last 72 hours. Urine analysis: No results found for: COLORURINE, APPEARANCEUR, LABSPEC, PHURINE, GLUCOSEU, HGBUR, BILIRUBINUR, KETONESUR, PROTEINUR, UROBILINOGEN, NITRITE, LEUKOCYTESUR Sepsis Labs: _1 (procalcitonin:4,lacticidven:4)  )No results found for this or any previous visit (from the past 240 hour(s)).       Radiology Studies: No results found.      Scheduled Meds: . allopurinol  300 mg Per Tube Daily  . amLODipine  5 mg Per Tube Daily  . apixaban  5 mg Per Tube BID  . aspirin  81 mg Per Tube Daily  . atorvastatin  20 mg Per Tube q1800  . carvedilol  25 mg Per Tube BID WC  . chlorhexidine gluconate (MEDLINE KIT)  15 mL Mouth Rinse BID  . Chlorhexidine Gluconate Cloth  6 each Topical Daily  . collagenase   Topical Daily  . febuxostat  40 mg Per Tube Daily  . feeding supplement (PRO-STAT SUGAR FREE 64)  30 mL Per Tube BID  . furosemide  80 mg Oral BID  . insulin aspart  0-15 Units Subcutaneous Q4H  . ipratropium-albuterol  3 mL Nebulization BID  . losartan  50 mg Per Tube Daily  . mouth rinse  15 mL Mouth Rinse QID  . Melatonin  6 mg Per Tube QHS  . mupirocin cream   Topical Daily  . pantoprazole sodium  40 mg  Per Tube Q1200  . potassium chloride  40 mEq Per Tube BID  . sertraline  50 mg Per Tube Daily  . sodium chloride flush  10-40 mL Intracatheter Q12H   Continuous Infusions: .  feeding supplement (VITAL HIGH PROTEIN) 1,000 mL (03/15/17 1713)     LOS: 28 days    Time spent: 40 minutes    Mirian Casco, Geraldo Docker, MD [facility] Pager [phone]   If 7PM-7AM, please contact night-coverage [email or web address] Password [facility] 03/16/2017, 8:46 AM

## 2017-03-17 LAB — BASIC METABOLIC PANEL
Anion gap: 10 (ref 5–15)
BUN: 25 mg/dL — AB (ref 6–20)
CALCIUM: 8.8 mg/dL — AB (ref 8.9–10.3)
CO2: 26 mmol/L (ref 22–32)
Chloride: 99 mmol/L — ABNORMAL LOW (ref 101–111)
Creatinine, Ser: 0.65 mg/dL (ref 0.61–1.24)
GFR calc non Af Amer: >60 mL/min (ref >60)
Glucose, Bld: 98 mg/dL (ref 65–99)
Potassium: 4.4 mmol/L (ref 3.5–5.1)
SODIUM: 135 mmol/L (ref 135–145)

## 2017-03-17 LAB — MAGNESIUM: Magnesium: 2.1 mg/dL (ref 1.7–2.4)

## 2017-03-17 LAB — GLUCOSE, CAPILLARY
GLUCOSE-CAPILLARY: 108 mg/dL — AB (ref 65–99)
GLUCOSE-CAPILLARY: 96 mg/dL (ref 65–99)
GLUCOSE-CAPILLARY: 122 mg/dL — AB (ref 65–99)
GLUCOSE-CAPILLARY: 103 mg/dL — AB (ref 65–99)
Glucose-Capillary: 98 mg/dL (ref 65–99)
Glucose-Capillary: 140 mg/dL — ABNORMAL HIGH (ref 65–99)
Glucose-Capillary: 143 mg/dL — ABNORMAL HIGH (ref 65–99)

## 2017-03-17 LAB — CBC
HEMATOCRIT: 40.5 % (ref 39.0–52.0)
Hemoglobin: 12.8 g/dL — ABNORMAL LOW (ref 13.0–17.0)
MCH: 29.6 pg (ref 26.0–34.0)
MCHC: 31.6 g/dL (ref 30.0–36.0)
MCV: 93.5 fL (ref 78.0–100.0)
PLATELETS: 245 10*3/uL (ref 150–400)
RBC: 4.33 MIL/uL (ref 4.22–5.81)
RDW: 16.3 % — ABNORMAL HIGH (ref 11.5–15.5)
WBC: 13.1 10*3/uL — AB (ref 4.0–10.5)

## 2017-03-17 MED ORDER — ALBUTEROL SULFATE (2.5 MG/3ML) 0.083% IN NEBU: 2.5000 mg | INHALATION_SOLUTION | RESPIRATORY_TRACT | Status: DC | PRN

## 2017-03-17 NOTE — Progress Notes (Signed)
[place] TEAM 1 - Stepdown/ICU TEAM  [name]  EHU:[id number] DOB: 1955/04/26 DOA: 02/16/2017 PCP: [name], MD    Brief Narrative:  62y/o male with a history of lymphoma in remission (last treated in 2012) who was admitted with ARDS due to influenza, and found to have new severe systolic congestive heart failure with an EF of 25%.    Significant Events: 2/7 admit  2/8 TTE - EF 25-30% - diffuse hypokinesis - mild mitral regurg 2/9 intubated  2/14 tracheostomy  2/24 trach changed to #6 distal XLT due to cuff leak  3/1 trach downsized to #5 cuffless XLT  Subjective: Looks great.  Alert and oriented.  No complaints today.  Remains highly motivated for CIR.    Assessment & Plan:  ARDS due to Influenza A   s/p trach - trach and vent care has been provided by PCCM - doing well w/ plugging of trach at this time   Acute pulmonary edema due to Acute systolic CHF (EF 78%) Cardiology has evaluated - unclear duration/new diagnosis - ?viral CM - coronary eval as outpt as per Cards - net negative ~16,600 since admit  - cont BB, ARB - cont diuretic   New onset persistent Atrial fibrillation   On eliquis - rate remains well controlled   Dysphagia  Cont to work w/ SLP - has now been cleared for D2 w/ nectar liquids   Hypomagnesemia Corrected   Hypokalemia Cont to supplement w/ ongoing use of diuretic   History of Non-Hodgkins lymphoma Has porta-cath in L chest   Bilateral foot wounds Wound care  DM2   CBG well controlled   Dermatitis on back v/s cellulitis  Has completed a tx course of clindamycin - clinically resolved   Deconditioning Reports that he is highly motivated to participate in rehab   Morbid obesity - Body mass index is 44.03 kg/m.   DVT prophylaxis: Eliquis  Code Status: FULL CODE Family Communication:  Disposition Plan: transfer to CIR when accepted and bed available    Consultants:  PCCM Cardiology   Antimicrobials:  Levaquin 2/3 >2/5 + 2/7    Vancomycin 2/7 > 2/12 Azactam 2/7 >2/12 Clindamycin 2/26 > 3/3  Objective: Blood pressure 120/85, pulse 92, temperature 97.7 F (36.5 C), temperature source Axillary, resp. rate 18, height '5\' 11"'$  (1.803 m), weight (!) 143.2 kg (315 lb 11.2 oz), SpO2 95 %.  Intake/Output Summary (Last 24 hours) at 03/17/2017 1214 Last data filed at 03/17/2017 0833 Gross per 24 hour  Intake 1070 ml  Output 825 ml  Net 245 ml   Filed Weights   03/15/17 0424 03/16/17 0334 03/17/17 0348  Weight: (!) 141 kg (310 lb 13.6 oz) (!) 140.6 kg (309 lb 15.5 oz) (!) 143.2 kg (315 lb 11.2 oz)    Examination: General: No acute respiratory distress - speaking clearly w/ trach capped  Lungs: distant breath sounds th/o  Cardiovascular: irreg irreg - controlled  Abdomen: NT/ND, morbidly obese Extremities: 1+ B LE edema   CBC: Recent Labs  Lab 03/12/17 0334 03/13/17 0238 03/16/17 0848 03/17/17 0414  WBC 11.1* 13.0* 13.3* 13.1*  HGB 11.9* 12.1* 12.4* 12.8*  HCT 37.5* 38.8* 39.6 40.5  MCV 93.5 93.9 93.6 93.5  PLT 284 257 228 588   Basic Metabolic Panel: Recent Labs  Lab 03/11/17 0500 03/12/17 0334 03/13/17 0238 03/14/17 0517 03/15/17 0350 03/16/17 0848 03/17/17 0414  NA 142 140 143 141 142 136 135  K 3.2* 3.3* 3.2* 3.7 4.0 3.9 4.4  CL 100* 100* 104 104 104 99* 99*  CO2 '27 28 30 28 28 27 26  '$ GLUCOSE 76 111* 111* 110* 127* 183* 98  BUN 28* 34* 35* 33* 37* 31* 25*  CREATININE 0.62 0.61 0.59* 0.60* 0.67 0.68 0.65  CALCIUM 8.5* 8.3* 8.6* 8.7* 8.8* 8.6* 8.8*  MG 2.1 2.0  --   --  1.9 1.7 2.1   GFR: Estimated Creatinine Clearance: 140.6 mL/min (by C-G formula based on SCr of 0.65 mg/dL).  HbA1C: Hgb A1c MFr Bld  Date/Time Value Ref Range Status  02/16/2017 11:00 PM 6.3 (H) 4.8 - 5.6 % Final    Comment:    (NOTE) Pre diabetes:          5.7%-6.4% Diabetes:              >6.4% Glycemic control for   <7.0% adults with diabetes     CBG: Recent Labs  Lab 03/16/17 1533 03/16/17 2010  03/16/17 2349 03/17/17 0347 03/17/17 1105  GLUCAP 105* 110* 147* 98 96     Scheduled Meds: . allopurinol  300 mg Per Tube Daily  . amLODipine  5 mg Per Tube Daily  . apixaban  5 mg Per Tube BID  . aspirin  81 mg Per Tube Daily  . atorvastatin  20 mg Per Tube q1800  . carvedilol  25 mg Per Tube BID WC  . chlorhexidine gluconate (MEDLINE KIT)  15 mL Mouth Rinse BID  . Chlorhexidine Gluconate Cloth  6 each Topical Daily  . collagenase   Topical Daily  . febuxostat  40 mg Per Tube Daily  . feeding supplement (PRO-STAT SUGAR FREE 64)  30 mL Per Tube BID  . furosemide  80 mg Oral BID  . insulin aspart  0-15 Units Subcutaneous Q4H  . losartan  50 mg Per Tube Daily  . mouth rinse  15 mL Mouth Rinse QID  . Melatonin  6 mg Per Tube QHS  . mupirocin cream   Topical Daily  . pantoprazole sodium  40 mg Per Tube Q1200  . potassium chloride  40 mEq Per Tube BID  . sertraline  50 mg Per Tube Daily  . sodium chloride flush  10-40 mL Intracatheter Q12H     LOS: 29 days   Cherene Altes, MD [facility] Office  [phone] Pager - Text Page per Amion as per below:  On-Call/Text Page:      [email or web address]      password [email or web address]  If 7PM-7AM, please contact night-coverage [email or web address] Password [facility] 03/17/2017, 12:14 PM

## 2017-03-17 NOTE — Progress Notes (Signed)
Inpatient Rehabilitation  Continuing to follow along for timing of medical readiness, therapy tolerance, insurance authorization, and IP Rehab bed availability.  Plan for [place] to follow up Monday in hopes of initiating [employer] authorization.    Carmelia Roller., CCC/SLP Admission Coordinator  [place]  Cell [phone]

## 2017-03-17 NOTE — Progress Notes (Signed)
Physical Therapy Treatment Patient Details Name: PLACIDO HANGARTNER MRN: [id number] DOB: 07/30/1955 Today's Date: 03/17/2017    History of Present Illness Pt is a 62 y/o male history of lymphoma in remission admitted with acute on chronic hypoxemic and hypercapnic respiratory failure due to influenza A.  He was also noted to have a new LVEF of 25% with contribution of cardiogenic edema.  He failed extubation on 2/14.  Based on his need for continued ventilatory support he underwent percutaneous tracheostomy on 2/14.    PT Comments    Pt making steady progress with functional mobility.  Pt tolerated ambulating a further distance this session (15' x2 with sitting rest break and toileting) with better physiological responses with less supplemental O2. Pt on 6L of [place] throughout with SPO2 decreasing to 87% with slow recovery to >90% with sitting rest break of several minutes and pursed lip breathing. PT will continue to follow acutely to progress mobility as tolerated.   Follow Up Recommendations  CIR;Supervision/Assistance - 24 hour     Equipment Recommendations  None recommended by PT    Recommendations for Other Services       Precautions / Restrictions Precautions Precautions: Fall Precaution Comments: trach capped, on O2 [place] Restrictions Weight Bearing Restrictions: No    Mobility  Bed Mobility Overal bed mobility: Needs Assistance Bed Mobility: Rolling;Supine to Sit Rolling: Mod assist   Supine to sit: Mod assist     General bed mobility comments: increased time and effort, use of bed rails, assist for LEs over EOB and to elevate trunk  Transfers Overall transfer level: Needs assistance Equipment used: Rolling walker (2 wheeled) Transfers: Sit to/from Stand Sit to Stand: +2 physical assistance;Mod assist         General transfer comment: pt performed sit<>stand x1 from EOB, x1 from [facility], x2 from recliner chair; pt using momentum and requiring physical assist of two    Ambulation/Gait Ambulation/Gait assistance: Min assist;+2 safety/equipment;+2 physical assistance Ambulation Distance (Feet): 15 Feet(15' x2 with sitting rest break) Assistive device: Rolling walker (2 wheeled) Gait Pattern/deviations: Step-through pattern;Decreased stride length;Trunk flexed;Wide base of support Gait velocity: decreased Gait velocity interpretation: Below normal speed for age/gender General Gait Details: pt with mild instability with RW, required close chair follow and min A secondary to fatigue; pt on 6L of O2 throughout with SPO2 decreasing to as low as 87% with slow recovery to >90% with sitting rest break   Stairs            Wheelchair Mobility    Modified Rankin (Stroke Patients Only)       Balance Overall balance assessment: Needs assistance Sitting-balance support: Feet supported Sitting balance-Leahy Scale: Fair     Standing balance support: Bilateral upper extremity supported;During functional activity Standing balance-Leahy Scale: Poor                              Cognition Arousal/Alertness: Awake/alert Behavior During Therapy: WFL for tasks assessed/performed Overall Cognitive Status: Within Functional Limits for tasks assessed                                        Exercises      General Comments        Pertinent Vitals/Pain Pain Assessment: Faces Faces Pain Scale: Hurts little more Pain Location: bottom (sitting in chair) Pain Descriptors / Indicators: Grimacing;Discomfort  Pain Intervention(s): Monitored during session;Repositioned    Home Living                      Prior Function            PT Goals (current goals can now be found in the care plan section) Acute Rehab PT Goals PT Goal Formulation: With patient Time For Goal Achievement: 03/23/17 Potential to Achieve Goals: Fair Progress towards PT goals: Progressing toward goals    Frequency    Min 3X/week      PT  Plan Current plan remains appropriate    Co-evaluation              AM-PAC PT "6 Clicks" Daily Activity  Outcome Measure  Difficulty turning over in bed (including adjusting bedclothes, sheets and blankets)?: Unable Difficulty moving from lying on back to sitting on the side of the bed? : Unable Difficulty sitting down on and standing up from a chair with arms (e.g., wheelchair, bedside commode, etc,.)?: Unable Help needed moving to and from a bed to chair (including a wheelchair)?: A Little Help needed walking in hospital room?: A Little Help needed climbing 3-5 steps with a railing? : A Lot 6 Click Score: 11    End of Session Equipment Utilized During Treatment: Gait belt;Oxygen;Other (comment)(6L of O2) Activity Tolerance: Patient limited by fatigue Patient left: in chair;with call bell/phone within reach;with family/visitor present Nurse Communication: Mobility status;Need for lift equipment PT Visit Diagnosis: Other abnormalities of gait and mobility (R26.89)     Time: 0388-8280 PT Time Calculation (min) (ACUTE ONLY): 56 min  Charges:  $Gait Training: 8-22 mins $Therapeutic Activity: 38-52 mins                    G Codes:       [place], [place], [place] 03/17/2017, 11:08 AM

## 2017-03-17 NOTE — Progress Notes (Signed)
PULMONARY / CRITICAL CARE MEDICINE   Name: [name] MRN: [id number] DOB: 12/16/1955    ADMISSION DATE:  02/16/2017 CONSULTATION DATE:  02/16/2017  REFERRING MD:  Regenia Skeeter  CHIEF COMPLAINT:  Dyspnea  HISTORY OF PRESENT ILLNESS:   62y/o male with a history of lymphoma in remission, last treated in 2012 who was admitted with ARDS due to influenza , found to have new severe systolic congestive heart failure with an EF of 25%  SUBJECTIVE: Doing well with trach capping.  STUDIES:  2/8 echo > EF 25-30% moderately dilated left ventricle, diffuse hypokinesis mild mitral valve regurgitation, left atrium mildly dilated  CULTURES: 2/7 BCx 2 >> negative 2/7 RVP >> influenza A 02/18/2017 BAL>> negative  ANTIBIOTICS: Levaquin 2/3 >2/5; 2/7 x 1 Vancomycin 2/7 >> 2/12 Azactam 2/7 >> 2/12 Tamiflu 2/8 > completed   SIGNIFICANT EVENTS: 2/7 Admit 02/18/2017 intubated for acute respiratory failure 02/18/2017 trach 2/25 tx sdu/trach changed 3/1: trach changed to cuffless XLT.  3/1: using PMV; SLP recommending NPO status d/t delayed swallow  3/4 changed to 5xlt prox, still on 80% but can be weaned  LINES/TUBES: Left chest port >> accessed 2/7 02/18/2017 orotracheal tube>>2/14 02/18/2017 orogastric tube>>2/14 Trach 2/14>>> changed to #6 shiley XLT distal 2/24  3/3 changed to xlt 5  VITAL SIGNS: BP 120/85 (BP Location: Left Wrist)   Pulse 92   Temp 97.7 F (36.5 C) (Axillary)   Resp 18   Ht _0  (1.803 m)   Wt (!) 143.2 kg (315 lb 11.2 oz)   SpO2 95%   BMI 44.03 kg/m   INTAKE / OUTPUT: I/O last 3 completed shifts: In: 1837 [P.O.:960; NG/GT:827; IV Piggyback:50] Out: 1900 [Urine:1900]  PHYSICAL EXAMINATION:  General - Pleasant male, confused in and out Eyes - PERRL, EOM-I and MMM ENT - No stridor, trach with cap, mild irritation around trach site Cardiac - RRR, Nl S1/S2 and -M/R/G Chest - Coarse BS diffusely Abd - Soft, NT, ND and +BS Ext - -edema and -tenderness Skin - No  rashes Neuro - Normal strength Psych - Normal mood  LABS:  BMET Recent Labs  Lab 03/15/17 0350 03/16/17 0848 03/17/17 0414  NA 142 136 135  K 4.0 3.9 4.4  CL 104 99* 99*  CO2 _1 BUN 37* 31* 25*  CREATININE 0.67 0.68 0.65  GLUCOSE 127* 183* 98   CBC Recent Labs  Lab 03/13/17 0238 03/16/17 0848 03/17/17 0414  WBC 13.0* 13.3* 13.1*  HGB 12.1* 12.4* 12.8*  HCT 38.8* 39.6 40.5  PLT 257 228 245   ABG    Component Value Date/Time   PHART 7.471 (H) 03/14/2017 0420   PCO2ART 42.7 03/14/2017 0420   PO2ART 64.1 (L) 03/14/2017 0420   HCO3 30.7 (H) 03/14/2017 0420   TCO2 32 03/01/2017 1109   ACIDBASEDEF 2.0 02/18/2017 1213   O2SAT 92.3 03/14/2017 0420   Imaging Dg Chest Port 1 View  Result Date: 03/13/2017 CLINICAL DATA:  Patient diagnosed with pneumonia approximately 1 month ago. No current complaints. EXAM: PORTABLE CHEST 1 VIEW COMPARISON:  Single-view of the chest 03/01/2017, 02/28/2017 and 02/16/2017. FINDINGS: Tracheostomy tube, feeding tube, left subclavian catheter and right PICC are unchanged since the most recent examination. Patchy bilateral airspace disease persists. Aeration shows some improvement since 02/27/2017 but is unchanged since the most recent exam. There is cardiomegaly. No pneumothorax. IMPRESSION: Patchy bilateral airspace disease appears unchanged since the most recent study but has improved somewhat since 02/27/2017. Electronically Signed   By: Marcello Moores  Dalessio M.D.   On: 03/13/2017 16:34   I reviewed CXR myself, trach is in good position.  ASSESSMENT / PLAN:  Acute hypoxic respiratory failure in setting of Influenza PNA with ARDS. S/p tracheostomy. Presumed sleep disordered breathing.  - Continue trach capping during the day  - Leave trach uncapped at night over the weekend, if tolerated then will decannulate on Monday.  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  - Per primary team  Dysphagia.  - F/u with speech therapy  Deconditioning.  - Being assessed for CIR  Discussed with PCCM-NP  PCCM will f/u on Monday 03/21/17 Rush Farmer, M.D. [facility] Pulmonary/Critical Care Medicine. Pager: [phone]. After hours pager: [phone].

## 2017-03-17 NOTE — Progress Notes (Signed)
03/17/2017 Central monitor called at 1154 patient had 9 runs of wide QRS and at 1200 had a 2.08 pause. Dr Thereasa Solo was text via [place] . [facility] RN.

## 2017-03-18 DIAGNOSIS — R4702 Dysphasia: Secondary | ICD-10-CM

## 2017-03-18 LAB — CBC
HEMATOCRIT: 39.4 % (ref 39.0–52.0)
Hemoglobin: 12.5 g/dL — ABNORMAL LOW (ref 13.0–17.0)
MCH: 29.7 pg (ref 26.0–34.0)
MCHC: 31.7 g/dL (ref 30.0–36.0)
MCV: 93.6 fL (ref 78.0–100.0)
Platelets: 241 10*3/uL (ref 150–400)
RBC: 4.21 MIL/uL — ABNORMAL LOW (ref 4.22–5.81)
RDW: 16.4 % — AB (ref 11.5–15.5)
WBC: 11.9 10*3/uL — AB (ref 4.0–10.5)

## 2017-03-18 LAB — BASIC METABOLIC PANEL
Anion gap: 9 (ref 5–15)
BUN: 20 mg/dL (ref 6–20)
CHLORIDE: 98 mmol/L — AB (ref 101–111)
CO2: 27 mmol/L (ref 22–32)
CREATININE: 0.61 mg/dL (ref 0.61–1.24)
Calcium: 8.6 mg/dL — ABNORMAL LOW (ref 8.9–10.3)
GFR calc non Af Amer: >60 mL/min (ref >60)
Glucose, Bld: 107 mg/dL — ABNORMAL HIGH (ref 65–99)
Potassium: 4.3 mmol/L (ref 3.5–5.1)
Sodium: 134 mmol/L — ABNORMAL LOW (ref 135–145)

## 2017-03-18 LAB — GLUCOSE, CAPILLARY
GLUCOSE-CAPILLARY: 101 mg/dL — AB (ref 65–99)
GLUCOSE-CAPILLARY: 204 mg/dL — AB (ref 65–99)
Glucose-Capillary: 101 mg/dL — ABNORMAL HIGH (ref 65–99)
Glucose-Capillary: 122 mg/dL — ABNORMAL HIGH (ref 65–99)
Glucose-Capillary: 153 mg/dL — ABNORMAL HIGH (ref 65–99)
Glucose-Capillary: 148 mg/dL — ABNORMAL HIGH (ref 65–99)
Glucose-Capillary: 150 mg/dL — ABNORMAL HIGH (ref 65–99)

## 2017-03-18 LAB — MAGNESIUM: Magnesium: 1.8 mg/dL (ref 1.7–2.4)

## 2017-03-18 MED ORDER — SERTRALINE HCL 50 MG PO TABS
50.0000 mg | ORAL_TABLET | Freq: Every day | ORAL | Status: DC
  Administered 2017-03-18 – 2017-03-21 (×4): 50 mg
  Filled 2017-03-18 (×4): qty 1

## 2017-03-18 NOTE — Progress Notes (Addendum)
PROGRESS NOTE    [name]  VXB:[id number] DOB: [birth year] DOA: 02/16/2017 PCP: [name], MD   Brief Narrative:  62y/o WM PMHx lymphoma in remission (last treated in 2012), HTN, dyslipidemia, gout  Admitted with ARDS due to influenza, and found to have new severe systolic congestive heart failure with an EF of 25%.     Subjective: 3/9 A/O 4, negative CP, negative abdominal pain, negative N/V. Stated ambulated to the door and back yesterday. Waiting for somebody to help him work out today.      Assessment & Plan:   Active Problems:   Respiratory failure, unspecified with hypoxia (HCC)   Acute respiratory failure (HCC)   Acute respiratory failure with hypoxemia (HCC)   Status post tracheostomy ([place])   Pressure injury of skin   Hypoxic   Tracheostomy status ([place])   Community acquired pneumonia   Critical illness myopathy   Dysphagia   Persistent atrial fibrillation (HCC)   Acute systolic congestive heart failure (HCC)   Non-Hodgkin's lymphoma (HCC)   Benign essential HTN   Leukocytosis   Hypokalemia  ARDS/influenza A pneumonia -S/P trach -Patient has been off vent since 2/26. -continued aggressive pulmonary toilet -titrate O2 to maintain SPO2> 93% -albuterol -frequent tracheal suctioning PRN  Acute Systolic CHF -LVEF = 33% -Strict in and out since admission -14.3 L -Daily weight Filed Weights   03/16/17 0334 03/17/17 0348 03/18/17 0333  Weight: (!) 309 lb 15.5 oz (140.6 kg) (!) 315 lb 11.2 oz (143.2 kg) (!) 313 lb 4.4 oz (142.1 kg)  -Cardiology following -Viral cardiomyopathy? Coronary eval per cardiology. -Amlodipine 5 mg daily -ASA 81 mg daily -Coreg 25 mg -Lasix 80 mg BID -Losartan 50 mg daily  New onset atrial fibrillation -Rate controlled -Eliquis 5 mg BID  Acute pulmonary edema -See systolic CHF   Dysphagia  -Scheduled for  FEES 2/28  -significantly improved DC CorTrak tube.  - DC tube feed   Hypomagnesemia -Magnesium goal>  2 -Magnesium IV 2 g   Hypokalemia -Potassium goal> 4   Non-Hodgkin's lymphoma -Port-A-Cath in LEFT chest -Per patient and wife in remission.  Bilateral foot wounds -Per wound care   DM Type 2   controlled with complication -2/7 Hemoglobin A1c = 6.3  -Moderate SSI  Cellulitis of back -Completed course of antibiotics. -Will need to monitor closely for C. difficile   Extreme DECONDITIONING  -patient continues to improve may qualify for CIR.  Anxiety/depression -Ativan PRN -Melatonin 6 mg daily -Zoloft 50 mg daily   Morbid obesity -Body mass index is 44.12 kg/m. -    DVT prophylaxis: Eliquis Code Status: Full Family Communication: Wife at bedside for discussion of plan care Disposition Plan:  CIR  Vs SNF?   Consultants:  [facility] Cardiology    Procedures/Significant Events:  2/7 admit  2/8 TTE - EF 25-30% - diffuse hypokinesis - mild mitral regurg 2/9 intubated  2/14 tracheostomy  2/24 trach changed to #6 distal XLT due to cuff leak  3/1 trach change to #6 proximal XLT cuffless   I have personally reviewed and interpreted all radiology studies and my findings are as above.  VENTILATOR SETTINGS:    Cultures     Antimicrobials: Anti-infectives (From admission, onward)   Start     Stop   03/08/17 1200  clindamycin (CLEOCIN) capsule 300 mg     03/12/17 1150   03/07/17 1800  clindamycin (CLEOCIN) capsule 300 mg  Status:  Discontinued     03/08/17 1003   02/20/17 1400  aztreonam (AZACTAM) 1 g in sodium chloride 0.9 % 100 mL IVPB  Status:  Discontinued     02/21/17 1020   02/19/17 0800  vancomycin (VANCOCIN) 1,500 mg in sodium chloride 0.9 % 500 mL IVPB  Status:  Discontinued     02/21/17 1020   02/18/17 2200  oseltamivir (TAMIFLU) 6 MG/ML suspension 75 mg     02/21/17 2112   02/17/17 1000  oseltamivir (TAMIFLU) capsule 75 mg  Status:  Discontinued     02/18/17 1412   02/17/17 0600  vancomycin (VANCOCIN) 1,250 mg in sodium chloride 0.9 % 250 mL  IVPB  Status:  Discontinued     02/19/17 0720   02/17/17 0200  aztreonam (AZACTAM) 1 g in dextrose 5 % 50 mL IVPB  Status:  Discontinued     02/20/17 0844   02/16/17 1845  aztreonam (AZACTAM) 2 g in dextrose 5 % 50 mL IVPB     02/16/17 1957   02/16/17 1830  vancomycin (VANCOCIN) 2,500 mg in sodium chloride 0.9 % 500 mL IVPB     02/16/17 2320   02/16/17 1745  levofloxacin (LEVAQUIN) IVPB 750 mg     02/16/17 1957        Devices    LINES / TUBES:  Left chest port >> accessed 2/7 02/18/2017 orotracheal tube>>2/14 02/18/2017 orogastric tube>>2/14 Trach 2/14>>> changed to #6 shiley XLT distal 2/24  3/3 changed to xlt 5 Trach change to #6 proximal XLT cuffless>>> 3/6 3/6 Trach changed to #4 uncuffed>>     Continuous Infusions:    Objective: Vitals:   03/17/17 2319 03/18/17 0333 03/18/17 0411 03/18/17 0741  BP: 109/73 105/72  115/85  Pulse: 71 76 74 94  Resp: (!) 21 (!) 21  (!) 25  Temp: 98.4 F (36.9 C) 98 F (36.7 C)  98.4 F (36.9 C)  TempSrc: Oral Oral  Oral  SpO2: 94% 93%  94%  Weight:  (!) 313 lb 4.4 oz (142.1 kg)    Height:        Intake/Output Summary (Last 24 hours) at 03/18/2017 0827 Last data filed at 03/18/2017 9833 Gross per 24 hour  Intake 340 ml  Output 2025 ml  Net -1685 ml   Filed Weights   03/16/17 0334 03/17/17 0348 03/18/17 0333  Weight: (!) 309 lb 15.5 oz (140.6 kg) (!) 315 lb 11.2 oz (143.2 kg) (!) 313 lb 4.4 oz (142.1 kg)     Physical Exam:  General: A/O 4, positive acute respiratory distress (now on trach collar) Neck:  Negative scars, masses, torticollis, lymphadenopathy, JVD, 4 cuffless trach now in place covered and clean negative sign of infection Lungs:  clear to auscultation bilateral without wheezes or crackles Cardiovascular:  regular rhythm and rate, without murmur gallop or rub normal S1 and S2 Abdomen: MORBIDLY obese, negative abdominal pain, nondistended, positive soft, bowel sounds, no rebound, no ascites, no appreciable  mass Extremities: No significant cyanosis, clubbing. 1 + pitting edema bilateral lower extremity improving. Skin: Negative rashes, lesions, ulcers Psychiatric:  Negative depression, negative anxiety, negative fatigue, negative mania  Central nervous system:  Cranial nerves II through XII intact, tongue/uvula midline, all extremities muscle strength 5/5, sensation intact throughout, negative dysarthria, negative expressive aphasia, negative receptive aphasia     Data Reviewed: Care during the described time interval was provided by me .  I have reviewed this patient's available data, including medical history, events of note, physical examination, and all test results as part of my evaluation.   CBC: Recent Labs  Lab 03/12/17 0334 03/13/17 0238 03/16/17 0848 03/17/17 0414 03/18/17 0547  WBC 11.1* 13.0* 13.3* 13.1* 11.9*  HGB 11.9* 12.1* 12.4* 12.8* 12.5*  HCT 37.5* 38.8* 39.6 40.5 39.4  MCV 93.5 93.9 93.6 93.5 93.6  PLT 284 257 228 245 638   Basic Metabolic Panel: Recent Labs  Lab 03/12/17 0334  03/14/17 0517 03/15/17 0350 03/16/17 0848 03/17/17 0414 03/18/17 0547  NA 140   < > 141 142 136 135 134*  K 3.3*   < > 3.7 4.0 3.9 4.4 4.3  CL 100*   < > 104 104 99* 99* 98*  CO2 28   < > '28 28 27 26 27  '$ GLUCOSE 111*   < > 110* 127* 183* 98 107*  BUN 34*   < > 33* 37* 31* 25* 20  CREATININE 0.61   < > 0.60* 0.67 0.68 0.65 0.61  CALCIUM 8.3*   < > 8.7* 8.8* 8.6* 8.8* 8.6*  MG 2.0  --   --  1.9 1.7 2.1 1.8   < > = values in this interval not displayed.   GFR: Estimated Creatinine Clearance: 139.9 mL/min (by C-G formula based on SCr of 0.61 mg/dL). Liver Function Tests: No results for input(s): AST, ALT, ALKPHOS, BILITOT, PROT, ALBUMIN in the last 168 hours. No results for input(s): LIPASE, AMYLASE in the last 168 hours. No results for input(s): AMMONIA in the last 168 hours. Coagulation Profile: No results for input(s): INR, PROTIME in the last 168 hours. Cardiac Enzymes: No  results for input(s): CKTOTAL, CKMB, CKMBINDEX, TROPONINI in the last 168 hours. BNP (last 3 results) No results for input(s): PROBNP in the last 8760 hours. HbA1C: No results for input(s): HGBA1C in the last 72 hours. CBG: Recent Labs  Lab 03/17/17 1732 03/17/17 1956 03/17/17 2317 03/18/17 0330 03/18/17 0739  GLUCAP 122* 143* 103* 101* 101*   Lipid Profile: No results for input(s): CHOL, HDL, LDLCALC, TRIG, CHOLHDL, LDLDIRECT in the last 72 hours. Thyroid Function Tests: No results for input(s): TSH, T4TOTAL, FREET4, T3FREE, THYROIDAB in the last 72 hours. Anemia Panel: No results for input(s): VITAMINB12, FOLATE, FERRITIN, TIBC, IRON, RETICCTPCT in the last 72 hours. Urine analysis: No results found for: COLORURINE, APPEARANCEUR, LABSPEC, PHURINE, GLUCOSEU, HGBUR, BILIRUBINUR, KETONESUR, PROTEINUR, UROBILINOGEN, NITRITE, LEUKOCYTESUR Sepsis Labs: '@LABRCNTIP'$ (procalcitonin:4,lacticidven:4)  )No results found for this or any previous visit (from the past 240 hour(s)).       Radiology Studies: No results found.      Scheduled Meds: . allopurinol  300 mg Per Tube Daily  . amLODipine  5 mg Per Tube Daily  . apixaban  5 mg Per Tube BID  . aspirin  81 mg Per Tube Daily  . atorvastatin  20 mg Per Tube q1800  . carvedilol  25 mg Per Tube BID WC  . chlorhexidine gluconate (MEDLINE KIT)  15 mL Mouth Rinse BID  . Chlorhexidine Gluconate Cloth  6 each Topical Daily  . collagenase   Topical Daily  . febuxostat  40 mg Per Tube Daily  . feeding supplement (PRO-STAT SUGAR FREE 64)  30 mL Per Tube BID  . furosemide  80 mg Oral BID  . insulin aspart  0-15 Units Subcutaneous Q4H  . losartan  50 mg Per Tube Daily  . mouth rinse  15 mL Mouth Rinse QID  . Melatonin  6 mg Per Tube QHS  . mupirocin cream   Topical Daily  . pantoprazole sodium  40 mg Per Tube Q1200  . potassium chloride  40 mEq Per Tube BID  . sertraline  50 mg Per Tube Daily  . sodium chloride flush  10-40 mL  Intracatheter Q12H   Continuous Infusions:    LOS: 30 days    Time spent: 40 minutes    Naliah Eddington, Geraldo Docker, MD [facility] Pager [phone]   If 7PM-7AM, please contact night-coverage [email or web address] Password [place] 03/18/2017, 8:27 AM

## 2017-03-18 NOTE — Progress Notes (Signed)
03/18/2017 patient is getting a pressure ulcer trach and foam dressing was applied [facility].

## 2017-03-19 DIAGNOSIS — M797 Fibromyalgia: Secondary | ICD-10-CM

## 2017-03-19 LAB — CBC
HEMATOCRIT: 37.7 % — AB (ref 39.0–52.0)
HEMOGLOBIN: 11.8 g/dL — AB (ref 13.0–17.0)
MCH: 29.4 pg (ref 26.0–34.0)
MCHC: 31.3 g/dL (ref 30.0–36.0)
MCV: 93.8 fL (ref 78.0–100.0)
Platelets: 221 10*3/uL (ref 150–400)
RBC: 4.02 MIL/uL — ABNORMAL LOW (ref 4.22–5.81)
RDW: 16.3 % — ABNORMAL HIGH (ref 11.5–15.5)
WBC: 12 10*3/uL — AB (ref 4.0–10.5)

## 2017-03-19 LAB — BASIC METABOLIC PANEL
Anion gap: 9 (ref 5–15)
BUN: 23 mg/dL — AB (ref 6–20)
CHLORIDE: 96 mmol/L — AB (ref 101–111)
CO2: 27 mmol/L (ref 22–32)
CREATININE: 0.83 mg/dL (ref 0.61–1.24)
Calcium: 8.4 mg/dL — ABNORMAL LOW (ref 8.9–10.3)
GFR calc Af Amer: >60 mL/min (ref >60)
GFR calc non Af Amer: >60 mL/min (ref >60)
Glucose, Bld: 101 mg/dL — ABNORMAL HIGH (ref 65–99)
POTASSIUM: 4.7 mmol/L (ref 3.5–5.1)
Sodium: 132 mmol/L — ABNORMAL LOW (ref 135–145)

## 2017-03-19 LAB — GLUCOSE, CAPILLARY
GLUCOSE-CAPILLARY: 131 mg/dL — AB (ref 65–99)
Glucose-Capillary: 92 mg/dL (ref 65–99)
Glucose-Capillary: 88 mg/dL (ref 65–99)
Glucose-Capillary: 114 mg/dL — ABNORMAL HIGH (ref 65–99)
Glucose-Capillary: 103 mg/dL — ABNORMAL HIGH (ref 65–99)

## 2017-03-19 LAB — MAGNESIUM: Magnesium: 1.8 mg/dL (ref 1.7–2.4)

## 2017-03-19 MED ORDER — ALBUTEROL SULFATE (2.5 MG/3ML) 0.083% IN NEBU
2.5000 mg | INHALATION_SOLUTION | Freq: Three times a day (TID) | RESPIRATORY_TRACT | Status: DC
  Administered 2017-03-19 – 2017-03-22 (×9): 2.5 mg via RESPIRATORY_TRACT
  Filled 2017-03-19 (×9): qty 3

## 2017-03-19 MED ORDER — INSULIN ASPART 100 UNIT/ML ~~LOC~~ SOLN
0.0000 [IU] | Freq: Three times a day (TID) | SUBCUTANEOUS | Status: DC
  Administered 2017-03-20 – 2017-03-22 (×3): 2 [IU] via SUBCUTANEOUS

## 2017-03-19 MED ORDER — INSULIN ASPART 100 UNIT/ML ~~LOC~~ SOLN: 0.0000 [IU] | Freq: Three times a day (TID) | SUBCUTANEOUS | Status: DC

## 2017-03-19 NOTE — Progress Notes (Signed)
PROGRESS NOTE    [name]  DZH:[id number] DOB: August 01, 1955 DOA: 02/16/2017 PCP: [name], MD   Brief Narrative:  [name] PMHx lymphoma in remission (last treated in 2012), HTN, dyslipidemia, gout  Admitted with ARDS due to influenza, and found to have new severe systolic congestive heart failure with an EF of 25%.     Subjective: 3/10 A/O 4, negative CP, negative abdominal pain, negative N/V. Resting in bed comfortably.       Assessment & Plan:   Active Problems:   Respiratory failure, unspecified with hypoxia (HCC)   Acute respiratory failure (HCC)   Acute respiratory failure with hypoxemia (HCC)   Status post tracheostomy ([place])   Pressure injury of skin   Hypoxic   Tracheostomy status ([place])   Community acquired pneumonia   Critical illness myopathy   Dysphagia   Persistent atrial fibrillation (HCC)   Acute systolic congestive heart failure (HCC)   Non-Hodgkin's lymphoma (HCC)   Benign essential HTN   Leukocytosis   Hypokalemia  ARDS/influenza A pneumonia -S/P trach -Patient has been off vent since 2/26. -continued aggressive pulmonary toilet -Titrate O2 to maintain SPO2> 93%.  -Albuterol TID -frequent tracheal suctioning PRN  Acute Systolic CHF -LVEF = 41% -Strict in and out since admission -11.8 L -Daily weight Filed Weights   03/17/17 0348 03/18/17 0333 03/19/17 0400  Weight: (!) 315 lb 11.2 oz (143.2 kg) (!) 313 lb 4.4 oz (142.1 kg) (!) 306 lb 7 oz (139 kg)  -Cardiology following -Viral cardiomyopathy? Coronary eval per cardiology. -Amlodipine 5 mg daily -ASA 81 mg daily -Coreg 25 mg BID -Lasix PO 80 mg BID -Losartan 50 mg daily  New onset atrial fibrillation -Rate controlled -Eliquis 5 mg BID  Acute pulmonary edema -See systolic CHF   Dysphagia  -Scheduled for  FEES 2/28  -Dysphagia 2 diet fluid consistency nectar thick   Hypomagnesemia -Magnesium goal> 2   Hypokalemia -Potassium goal> 4  Non-Hodgkin's  lymphoma -Port-A-Cath in LEFT chest -Per patient and wife in remission.  Bilateral foot wounds -Per wound care   DM Type 2   controlled with complication -2/7 Hemoglobin A1c = 6.3  -Moderate SSI  Cellulitis of back -Completed course of antibiotics. -Will need to monitor closely for C. difficile   Extreme DECONDITIONING  -patient continues to improve may qualify for CIR.  Anxiety/depression -Ativan PRN -Melatonin 6 mg daily -Zoloft 50 mg daily   Morbid obesity -Body mass index is 44.12 kg/m.    DVT prophylaxis: Eliquis Code Status: Full Family Communication: Wife at bedside for discussion of plan care Disposition Plan:  CIR  Vs SNF?   Consultants:  [facility] Cardiology    Procedures/Significant Events:  2/7 admit  2/8 TTE - EF 25-30% - diffuse hypokinesis - mild mitral regurg 2/9 intubated  2/14 tracheostomy  2/24 trach changed to #6 distal XLT due to cuff leak  3/1 trach change to #6 proximal XLT cuffless 3/6 Trach changed to #4 uncuffed>>   I have personally reviewed and interpreted all radiology studies and my findings are as above.  VENTILATOR SETTINGS:    Cultures     Antimicrobials: Anti-infectives (From admission, onward)   Start     Stop   03/08/17 1200  clindamycin (CLEOCIN) capsule 300 mg     03/12/17 1150   03/07/17 1800  clindamycin (CLEOCIN) capsule 300 mg  Status:  Discontinued     03/08/17 1003   02/20/17 1400  aztreonam (AZACTAM) 1 g in sodium chloride 0.9 % 100  mL IVPB  Status:  Discontinued     02/21/17 1020   02/19/17 0800  vancomycin (VANCOCIN) 1,500 mg in sodium chloride 0.9 % 500 mL IVPB  Status:  Discontinued     02/21/17 1020   02/18/17 2200  oseltamivir (TAMIFLU) 6 MG/ML suspension 75 mg     02/21/17 2112   02/17/17 1000  oseltamivir (TAMIFLU) capsule 75 mg  Status:  Discontinued     02/18/17 1412   02/17/17 0600  vancomycin (VANCOCIN) 1,250 mg in sodium chloride 0.9 % 250 mL IVPB  Status:  Discontinued     02/19/17 0720    02/17/17 0200  aztreonam (AZACTAM) 1 g in dextrose 5 % 50 mL IVPB  Status:  Discontinued     02/20/17 0844   02/16/17 1845  aztreonam (AZACTAM) 2 g in dextrose 5 % 50 mL IVPB     02/16/17 1957   02/16/17 1830  vancomycin (VANCOCIN) 2,500 mg in sodium chloride 0.9 % 500 mL IVPB     02/16/17 2320   02/16/17 1745  levofloxacin (LEVAQUIN) IVPB 750 mg     02/16/17 1957        Devices    LINES / TUBES:  Left chest port >> accessed 2/7 02/18/2017 orotracheal tube>>2/14 02/18/2017 orogastric tube>>2/14 Trach 2/14>>> changed to #6 shiley XLT distal 2/24  3/3 changed to xlt 5 Trach change to #6 proximal XLT cuffless>>> 3/6 3/6 Trach changed to #4 uncuffed>>     Continuous Infusions:    Objective: Vitals:   03/19/17 0600 03/19/17 0746 03/19/17 0807 03/19/17 0826  BP:  101/81 101/81   Pulse:  80 77 80  Resp: _0 Temp:  98.6 F (37 C)    TempSrc:  Oral    SpO2: (!) 88% 90%  94%  Weight:      Height:        Intake/Output Summary (Last 24 hours) at 03/19/2017 0849 Last data filed at 03/19/2017 0700 Gross per 24 hour  Intake 250 ml  Output 1275 ml  Net -1025 ml   Filed Weights   03/17/17 0348 03/18/17 0333 03/19/17 0400  Weight: (!) 315 lb 11.2 oz (143.2 kg) (!) 313 lb 4.4 oz (142.1 kg) (!) 306 lb 7 oz (139 kg)     Physical Exam:  General: A/O 4, positive No acute respiratory distress (now on trach collar) Neck:  Negative scars, masses, torticollis, lymphadenopathy, JVD, #4 cuffless trach in place negative sign of infection.  Lungs: clear to auscultation bilateral, without wheezes or crackles Cardiovascular:  regular rhythm and rate, without murmur gallop or rub normal S1 and S2 Abdomen: MORBIDLY obese, negative abdominal pain, nondistended, positive soft, bowel sounds, no rebound, no ascites, no appreciable mass Extremities: No significant cyanosis, clubbing. pitting edema . Skin: Negative rashes, lesions, ulcers Psychiatric:  Negative depression, negative  anxiety, negative fatigue, negative mania  Central nervous system:  Cranial nerves II through XII intact, tongue/uvula midline, all extremities muscle strength 5/5, sensation intact throughout, negative dysarthria, negative expressive aphasia, negative receptive aphasia     Data Reviewed: Care during the described time interval was provided by me .  I have reviewed this patient's available data, including medical history, events of note, physical examination, and all test results as part of my evaluation.   CBC: Recent Labs  Lab 03/13/17 0238 03/16/17 0848 03/17/17 0414 03/18/17 0547 03/19/17 0402  WBC 13.0* 13.3* 13.1* 11.9* 12.0*  HGB 12.1* 12.4* 12.8* 12.5* 11.8*  HCT 38.8* 39.6 40.5 39.4  37.7*  MCV 93.9 93.6 93.5 93.6 93.8  PLT 257 228 245 241 250   Basic Metabolic Panel: Recent Labs  Lab 03/15/17 0350 03/16/17 0848 03/17/17 0414 03/18/17 0547 03/19/17 0402  NA 142 136 135 134* 132*  K 4.0 3.9 4.4 4.3 4.7  CL 104 99* 99* 98* 96*  CO2 _0 GLUCOSE 127* 183* 98 107* 101*  BUN 37* 31* 25* 20 23*  CREATININE 0.67 0.68 0.65 0.61 0.83  CALCIUM 8.8* 8.6* 8.8* 8.6* 8.4*  MG 1.9 1.7 2.1 1.8 1.8   GFR: Estimated Creatinine Clearance: 133.3 mL/min (by C-G formula based on SCr of 0.83 mg/dL). Liver Function Tests: No results for input(s): AST, ALT, ALKPHOS, BILITOT, PROT, ALBUMIN in the last 168 hours. No results for input(s): LIPASE, AMYLASE in the last 168 hours. No results for input(s): AMMONIA in the last 168 hours. Coagulation Profile: No results for input(s): INR, PROTIME in the last 168 hours. Cardiac Enzymes: No results for input(s): CKTOTAL, CKMB, CKMBINDEX, TROPONINI in the last 168 hours. BNP (last 3 results) No results for input(s): PROBNP in the last 8760 hours. HbA1C: No results for input(s): HGBA1C in the last 72 hours. CBG: Recent Labs  Lab 03/18/17 1816 03/18/17 1933 03/18/17 2349 03/19/17 0430 03/19/17 0745  GLUCAP 148* 150* 204* 92 88    Lipid Profile: No results for input(s): CHOL, HDL, LDLCALC, TRIG, CHOLHDL, LDLDIRECT in the last 72 hours. Thyroid Function Tests: No results for input(s): TSH, T4TOTAL, FREET4, T3FREE, THYROIDAB in the last 72 hours. Anemia Panel: No results for input(s): VITAMINB12, FOLATE, FERRITIN, TIBC, IRON, RETICCTPCT in the last 72 hours. Urine analysis: No results found for: COLORURINE, APPEARANCEUR, LABSPEC, PHURINE, GLUCOSEU, HGBUR, BILIRUBINUR, KETONESUR, PROTEINUR, UROBILINOGEN, NITRITE, LEUKOCYTESUR Sepsis Labs: _1 (procalcitonin:4,lacticidven:4)  )No results found for this or any previous visit (from the past 240 hour(s)).       Radiology Studies: No results found.      Scheduled Meds: . allopurinol  300 mg Per Tube Daily  . amLODipine  5 mg Per Tube Daily  . apixaban  5 mg Per Tube BID  . aspirin  81 mg Per Tube Daily  . atorvastatin  20 mg Per Tube q1800  . carvedilol  25 mg Per Tube BID WC  . chlorhexidine gluconate (MEDLINE KIT)  15 mL Mouth Rinse BID  . Chlorhexidine Gluconate Cloth  6 each Topical Daily  . collagenase   Topical Daily  . febuxostat  40 mg Per Tube Daily  . feeding supplement (PRO-STAT SUGAR FREE 64)  30 mL Per Tube BID  . furosemide  80 mg Oral BID  . insulin aspart  0-15 Units Subcutaneous Q4H  . losartan  50 mg Per Tube Daily  . mouth rinse  15 mL Mouth Rinse QID  . Melatonin  6 mg Per Tube QHS  . mupirocin cream   Topical Daily  . pantoprazole sodium  40 mg Per Tube Q1200  . potassium chloride  40 mEq Per Tube BID  . sertraline  50 mg Per Tube Daily  . sodium chloride flush  10-40 mL Intracatheter Q12H   Continuous Infusions:    LOS: 31 days    Time spent: 40 minutes    Lucresha Dismuke, Geraldo Docker, MD [facility] Pager [phone]   If 7PM-7AM, please contact night-coverage [email or web address] Password [facility] 03/19/2017, 8:49 AM

## 2017-03-19 NOTE — Plan of Care (Signed)
Discussed with patient and family plan of care for the evening, pain management and again reminded about fluid restrictions with some teach back displayed.

## 2017-03-19 NOTE — Plan of Care (Signed)
Discussed with patient and family plan of care for the evening, pain management and medications with some teach back displayed

## 2017-03-20 LAB — GLUCOSE, CAPILLARY
GLUCOSE-CAPILLARY: 127 mg/dL — AB (ref 65–99)
Glucose-Capillary: 105 mg/dL — ABNORMAL HIGH (ref 65–99)
Glucose-Capillary: 128 mg/dL — ABNORMAL HIGH (ref 65–99)
Glucose-Capillary: 109 mg/dL — ABNORMAL HIGH (ref 65–99)

## 2017-03-20 LAB — BASIC METABOLIC PANEL
ANION GAP: 9 (ref 5–15)
BUN: 28 mg/dL — ABNORMAL HIGH (ref 6–20)
CHLORIDE: 96 mmol/L — AB (ref 101–111)
CO2: 28 mmol/L (ref 22–32)
CREATININE: 0.79 mg/dL (ref 0.61–1.24)
Calcium: 8.7 mg/dL — ABNORMAL LOW (ref 8.9–10.3)
GFR calc non Af Amer: >60 mL/min (ref >60)
Glucose, Bld: 108 mg/dL — ABNORMAL HIGH (ref 65–99)
POTASSIUM: 4.7 mmol/L (ref 3.5–5.1)
SODIUM: 133 mmol/L — AB (ref 135–145)

## 2017-03-20 LAB — CBC
HEMATOCRIT: 36.4 % — AB (ref 39.0–52.0)
HEMOGLOBIN: 11.6 g/dL — AB (ref 13.0–17.0)
MCH: 29.7 pg (ref 26.0–34.0)
MCHC: 31.9 g/dL (ref 30.0–36.0)
MCV: 93.1 fL (ref 78.0–100.0)
Platelets: 229 10*3/uL (ref 150–400)
RBC: 3.91 MIL/uL — ABNORMAL LOW (ref 4.22–5.81)
RDW: 16.3 % — AB (ref 11.5–15.5)
WBC: 12.7 10*3/uL — AB (ref 4.0–10.5)

## 2017-03-20 LAB — MAGNESIUM: MAGNESIUM: 1.8 mg/dL (ref 1.7–2.4)

## 2017-03-20 NOTE — Progress Notes (Signed)
Pt to stay capped overnight per Dr. Coralee North order. Pt tolerating well. Will continue to monitor.

## 2017-03-20 NOTE — Progress Notes (Signed)
RT came to do trach check- Pt is walking with PT at the time. Vitals charted on the flowshaeet

## 2017-03-20 NOTE — Progress Notes (Signed)
Physical Therapy Treatment Patient Details Name: [name] MRN: [id number] DOB: 02-07-1955 Today's Date: 03/20/2017    History of Present Illness Pt is a 62 y/o male history of lymphoma in remission admitted with acute on chronic hypoxemic and hypercapnic respiratory failure due to influenza A.  He was also noted to have a new LVEF of 25% with contribution of cardiogenic edema.  He failed extubation on 2/14.  Based on his need for continued ventilatory support he underwent percutaneous tracheostomy on 2/14.    PT Comments    Pt continues to make excellent progress and remains motivated to continue working toward independence. Continue to feel pt excellent candidate for CIR.    Follow Up Recommendations  CIR     Equipment Recommendations  None recommended by PT    Recommendations for Other Services       Precautions / Restrictions Precautions Precautions: Fall Precaution Comments: trach capped, on O2 [place] Restrictions Weight Bearing Restrictions: No    Mobility  Bed Mobility               General bed mobility comments: Up to EOB with OT  Transfers Overall transfer level: Needs assistance Equipment used: Rolling walker (2 wheeled) Transfers: Sit to/from Stand Sit to Stand: +2 physical assistance;Mod assist         General transfer comment: Assist to bring hips up and pt using rocking momentum. Pt required 2 person mod assist from bed and low recliner and 1 person mod assist from [facility].  Ambulation/Gait Ambulation/Gait assistance: Min assist;+2 safety/equipment;+2 physical assistance Ambulation Distance (Feet): 20 Feet(8' x 1, 20' x 1, 10' x 1) Assistive device: Rolling walker (2 wheeled) Gait Pattern/deviations: Step-through pattern;Decreased stride length;Trunk flexed;Wide base of support Gait velocity: decreased Gait velocity interpretation: Below normal speed for age/gender General Gait Details: Assist for balance and support and close follow with chair due  to fatigue. Pt amb on 3L initially with SpO2 to 89% and dyspnea 3-4/4. On longer walk incr O2 to 6L with SpO2 >91%. On last walk pt amb on 4L with SpO2 >92%. Verbal cues to take his time and to stand more erect. Cues for pursed lip breathing after amb   Stairs            Wheelchair Mobility    Modified Rankin (Stroke Patients Only)       Balance Overall balance assessment: Needs assistance Sitting-balance support: Feet supported Sitting balance-Leahy Scale: Fair     Standing balance support: Bilateral upper extremity supported;During functional activity Standing balance-Leahy Scale: Poor Standing balance comment: walker and min guard for static standing                            Cognition Arousal/Alertness: Awake/alert Behavior During Therapy: WFL for tasks assessed/performed Overall Cognitive Status: Within Functional Limits for tasks assessed                                        Exercises      General Comments        Pertinent Vitals/Pain Pain Assessment: No/denies pain Faces Pain Scale: No hurt    Home Living                      Prior Function            PT Goals (current goals  can now be found in the care plan section) Acute Rehab PT Goals PT Goal Formulation: With patient Time For Goal Achievement: 04/06/17 Potential to Achieve Goals: Good Progress towards PT goals: Goals met and updated - see care plan    Frequency    Min 3X/week      PT Plan Current plan remains appropriate    Co-evaluation PT/OT/SLP Co-Evaluation/Treatment: Yes Reason for Co-Treatment: For patient/therapist safety PT goals addressed during session: Mobility/safety with mobility        AM-PAC PT "6 Clicks" Daily Activity  Outcome Measure  Difficulty turning over in bed (including adjusting bedclothes, sheets and blankets)?: Unable Difficulty moving from lying on back to sitting on the side of the bed? : Unable Difficulty  sitting down on and standing up from a chair with arms (e.g., wheelchair, bedside commode, etc,.)?: Unable Help needed moving to and from a bed to chair (including a wheelchair)?: A Lot Help needed walking in hospital room?: A Little Help needed climbing 3-5 steps with a railing? : A Lot 6 Click Score: 10    End of Session Equipment Utilized During Treatment: Gait belt;Oxygen Activity Tolerance: Patient tolerated treatment well Patient left: in chair;with call bell/phone within reach;with family/visitor present Nurse Communication: Mobility status PT Visit Diagnosis: Other abnormalities of gait and mobility (R26.89)     Time: 4742-5956 PT Time Calculation (min) (ACUTE ONLY): 35 min  Charges:  $Gait Training: 8-22 mins                    G Codes:       [facility] PT [place] 03/20/2017, 12:54 PM

## 2017-03-20 NOTE — Progress Notes (Signed)
[place] TEAM 1 - Stepdown/ICU TEAM  Tyton D Habeck  MRN:[id number] DOB: 10/05/1955 DOA: 02/16/2017 PCP: [name], William, MD    Brief Narrative:  62y/o male with a history of lymphoma in remission (last treated in 2012) who was admitted with ARDS due to influenza, and found to have new severe systolic congestive heart failure with an EF of 25%.    Significant Events: 2/7 admit  2/8 TTE - EF 25-30% - diffuse hypokinesis - mild mitral regurg 2/9 intubated  2/14 tracheostomy  2/24 trach changed to #6 distal XLT due to cuff leak  3/1 trach downsized to #5 cuffless XLT  Subjective: Working w/ PT.  In good spirits.  Denies cp, sob, n/v, or abdom pain.      Assessment & Plan:  ARDS due to Influenza A   s/p trach - trach and vent care has been provided by PCCM - doing well w/ plugging of trach    Acute pulmonary edema due to Acute systolic CHF (EF 25%) Cardiology has evaluated - unclear duration/new diagnosis - ?viral CM - coronary eval as outpt as per Cards - cont BB, ARB - cont diuretic   New onset persistent Atrial fibrillation   On eliquis - rate well controlled   Dysphagia  Cont to work w/ SLP - D2 w/ nectar liquids   Hypomagnesemia Corrected   Hypokalemia Cont to supplement w/ ongoing use of diuretic   History of Non-Hodgkins lymphoma Has porta-cath in L chest   Bilateral foot wounds Wound care  DM2   CBG controlled   Deconditioning Remains highly motivated to participate in rehab   Morbid obesity - Body mass index is 43.2 kg/m.   DVT prophylaxis: Eliquis  Code Status: FULL CODE Family Communication: spoke w/ signif other at bedside  Disposition Plan: transfer to CIR when accepted and bed available    Consultants:  PCCM Cardiology   Antimicrobials:  Levaquin 2/3 >2/5 + 2/7  Vancomycin 2/7 > 2/12 Azactam 2/7 >2/12 Clindamycin 2/26 > 3/3  Objective: Blood pressure (!) 87/73, pulse 92, temperature (!) 97.3 F (36.3 C), temperature source Axillary,  resp. rate (!) 21, height 5' 11" (1.803 m), weight (!) 140.5 kg (309 lb 11.9 oz), SpO2 90 %.  Intake/Output Summary (Last 24 hours) at 03/20/2017 1340 Last data filed at 03/20/2017 1300 Gross per 24 hour  Intake 1570 ml  Output 1775 ml  Net -205 ml   Filed Weights   03/18/17 0333 03/19/17 0400 03/20/17 0135  Weight: (!) 142.1 kg (313 lb 4.4 oz) (!) 139 kg (306 lb 7 oz) (!) 140.5 kg (309 lb 11.9 oz)    Examination: General: No acute respiratory distress  Lungs: distant breath sounds th/o  Cardiovascular: irreg irreg  Abdomen: NT/ND, morbidly obese Extremities: 1+ B LE edema   CBC: Recent Labs  Lab 03/16/17 0848 03/17/17 0414 03/18/17 0547 03/19/17 0402 03/20/17 0125  WBC 13.3* 13.1* 11.9* 12.0* 12.7*  HGB 12.4* 12.8* 12.5* 11.8* 11.6*  HCT 39.6 40.5 39.4 37.7* 36.4*  MCV 93.6 93.5 93.6 93.8 93.1  PLT 228 245 241 221 229   Basic Metabolic Panel: Recent Labs  Lab 03/16/17 0848 03/17/17 0414 03/18/17 0547 03/19/17 0402 03/20/17 0125  NA 136 135 134* 132* 133*  K 3.9 4.4 4.3 4.7 4.7  CL 99* 99* 98* 96* 96*  CO2 27 26 27 27 28  GLUCOSE 183* 98 107* 101* 108*  BUN 31* 25* 20 23* 28*  CREATININE 0.68 0.65 0.61 0.83 0.79  CALCIUM 8.6*   8.8* 8.6* 8.4* 8.7*  MG 1.7 2.1 1.8 1.8 1.8   GFR: Estimated Creatinine Clearance: 139.1 mL/min (by C-G formula based on SCr of 0.79 mg/dL).  HbA1C: Hgb A1c MFr Bld  Date/Time Value Ref Range Status  02/16/2017 11:00 PM 6.3 (H) 4.8 - 5.6 % Final    Comment:    (NOTE) Pre diabetes:          5.7%-6.4% Diabetes:              >6.4% Glycemic control for   <7.0% adults with diabetes     CBG: Recent Labs  Lab 03/19/17 1239 03/19/17 1649 03/19/17 2100 03/20/17 0754 03/20/17 1126  GLUCAP 114* 103* 131* 105* 127*     Scheduled Meds: . albuterol  2.5 mg Nebulization TID  . allopurinol  300 mg Per Tube Daily  . amLODipine  5 mg Per Tube Daily  . apixaban  5 mg Per Tube BID  . aspirin  81 mg Per Tube Daily  . atorvastatin   20 mg Per Tube q1800  . carvedilol  25 mg Per Tube BID WC  . chlorhexidine gluconate (MEDLINE KIT)  15 mL Mouth Rinse BID  . Chlorhexidine Gluconate Cloth  6 each Topical Daily  . collagenase   Topical Daily  . febuxostat  40 mg Per Tube Daily  . feeding supplement (PRO-STAT SUGAR FREE 64)  30 mL Per Tube BID  . furosemide  80 mg Oral BID  . insulin aspart  0-15 Units Subcutaneous TID WC  . losartan  50 mg Per Tube Daily  . mouth rinse  15 mL Mouth Rinse QID  . Melatonin  6 mg Per Tube QHS  . mupirocin cream   Topical Daily  . pantoprazole sodium  40 mg Per Tube Q1200  . potassium chloride  40 mEq Per Tube BID  . sertraline  50 mg Per Tube Daily  . sodium chloride flush  10-40 mL Intracatheter Q12H     LOS: 32 days   Jeffrey T. McClung, MD [facility] Office  [phone] Pager - Text Page per Amion as per below:  On-Call/Text Page:      [email or web address]      password [email or web address]  If 7PM-7AM, please contact night-coverage [email or web address] Password [email or web address] 03/20/2017, 1:40 PM     

## 2017-03-20 NOTE — Progress Notes (Signed)
Inpatient Rehabilitation  Have initiated insurance authorization for an IP Rehab admission.  Hopeful for admission tomorrow, 03/21/17.  Will follow up with team when we have a decision.  Call if questions.   Carmelia Roller., CCC/SLP Admission Coordinator  [place]  Cell [phone]

## 2017-03-20 NOTE — Progress Notes (Signed)
  Speech Language Pathology Treatment: Dysphagia  Patient Details Name: [name] MRN: [id number] DOB: March 20, 1955 Today's Date: 03/20/2017 Time: 8032-1224 SLP Time Calculation (min) (ACUTE ONLY): 18 min  Assessment / Plan / Recommendation Clinical Impression  Pt appears to be tolerating current diet including ice chips given that he remains afebrile, breath sounds are unchanged, and he continues to wean O2 requirements. Min cues were provided to verbalize/utilize swallowing precautions. SLP provided trials of thin liquids with intermittent throat clearing noted. Pt also had intermittent throat clearing at baseline, which in addition to h/o silent penetration/aspiration makes repeat FEES warranted prior to advancement. This can be scheduled for tomorrow - pt is in agreement. Would keep current diet and precautions until this is completed.   HPI HPI: 62y/o male with a history of lymphoma in remission(last treated in 2012)who was admitted with ARDS due to influenza, andfound to have new severe systolic congestive heart failure with an EF of 25%.Pt was intubated on 2/9, trach placed at bedside on 2/24. #6 distal XLT placed on 2/24 due to cuff leak. Changed to Shiley 4 uncuffed placed on 3/6. Capped 3/7. Pt has a Cortrak.       SLP Plan  Other (Comment)(FEES)       Recommendations  Diet recommendations: Dysphagia 2 (fine chop);Nectar-thick liquid Liquids provided via: Straw;Cup Medication Administration: Crushed with puree Supervision: Patient able to self feed;Full supervision/cueing for compensatory strategies Compensations: Slow rate;Small sips/bites;Multiple dry swallows after each bite/sip;Chin tuck Postural Changes and/or Swallow Maneuvers: Seated upright 90 degrees;Upright 30-60 min after meal                Oral Care Recommendations: Oral care BID;Oral care prior to ice chip/H20 Follow up Recommendations: Inpatient Rehab SLP Visit Diagnosis: Dysphagia, pharyngeal phase  (R13.13) Plan: Other (Comment)(FEES)       GO                [name] 03/20/2017, 12:12 PM  [name], M.A. CCC-SLP [phone]

## 2017-03-20 NOTE — Progress Notes (Addendum)
PULMONARY / CRITICAL CARE MEDICINE   Name: [name] MRN: [id number] DOB: August 31, 1955    ADMISSION DATE:  02/16/2017 CONSULTATION DATE:  02/16/2017  REFERRING MD:  Regenia Skeeter  CHIEF COMPLAINT:  Dyspnea  HISTORY OF PRESENT ILLNESS:   62y/o male with a history of lymphoma in remission, last treated in 2012 who was admitted with ARDS due to influenza , found to have new severe systolic congestive heart failure with an EF of 25%  SUBJECTIVE: Doing well with trach capping but been uncapped at night.  STUDIES:  2/8 echo > EF 25-30% moderately dilated left ventricle, diffuse hypokinesis mild mitral valve regurgitation, left atrium mildly dilated  CULTURES: 2/7 BCx 2 >> negative 2/7 RVP >> influenza A 02/18/2017 BAL>> negative  ANTIBIOTICS: Levaquin 2/3 >2/5; 2/7 x 1 Vancomycin 2/7 >> 2/12 Azactam 2/7 >> 2/12 Tamiflu 2/8 > completed   SIGNIFICANT EVENTS: 2/7 Admit 02/18/2017 intubated for acute respiratory failure 02/18/2017 trach 2/25 tx sdu/trach changed 3/1: trach changed to cuffless XLT.  3/1: using PMV; SLP recommending NPO status d/t delayed swallow  3/4 changed to 5xlt prox, still on 80% but can be weaned  LINES/TUBES: Left chest port >> accessed 2/7 02/18/2017 orotracheal tube>>2/14 02/18/2017 orogastric tube>>2/14 Trach 2/14>>> changed to #6 shiley XLT distal 2/24  3/3 changed to xlt 5  VITAL SIGNS: BP (!) 88/56   Pulse 70   Temp 98.6 F (37 C) (Oral)   Resp 16   Ht _0  (1.803 m)   Wt (!) 140.5 kg (309 lb 11.9 oz)   SpO2 96%   BMI 43.20 kg/m   INTAKE / OUTPUT: I/O last 3 completed shifts: In: 9678 [P.O.:1200; I.V.:20] Out: 1550 [Urine:1550]  PHYSICAL EXAMINATION:  General - Pleasant male, confused at time Eyes - PERRL, EOM-I and MMM ENT - Trach in place, capped Cardiac - RRR, Nl S1/S2 and -M/R/G. Chest - Coarse BS diffusely Abd - Soft, NT, ND and +BS Ext - - edema and -tenderness Skin - Intact Neuro - Normal strength Psych - Normal  mood  LABS:  BMET Recent Labs  Lab 03/18/17 0547 03/19/17 0402 03/20/17 0125  NA 134* 132* 133*  K 4.3 4.7 4.7  CL 98* 96* 96*  CO2 _1 BUN 20 23* 28*  CREATININE 0.61 0.83 0.79  GLUCOSE 107* 101* 108*   CBC Recent Labs  Lab 03/18/17 0547 03/19/17 0402 03/20/17 0125  WBC 11.9* 12.0* 12.7*  HGB 12.5* 11.8* 11.6*  HCT 39.4 37.7* 36.4*  PLT 241 221 229   ABG    Component Value Date/Time   PHART 7.471 (H) 03/14/2017 0420   PCO2ART 42.7 03/14/2017 0420   PO2ART 64.1 (L) 03/14/2017 0420   HCO3 30.7 (H) 03/14/2017 0420   TCO2 32 03/01/2017 1109   ACIDBASEDEF 2.0 02/18/2017 1213   O2SAT 92.3 03/14/2017 0420   Imaging I reviewed CXR myself, trach is in good position.  ASSESSMENT / PLAN:  Acute hypoxic respiratory failure in setting of Influenza PNA with ARDS. S/p tracheostomy. Presumed sleep disordered breathing.  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  - Per primary team  Dysphagia.  - F/u with speech therapy, appreciate input  Deconditioning.  - Being assessed for CIR  Discussed with PCCM-NP  PCCM will f/u on Wednesday for potential decannulation.  Rush Farmer, M.D. [facility] Pulmonary/Critical Care Medicine. Pager: [phone]. After hours pager: [phone].

## 2017-03-20 NOTE — Consult Note (Addendum)
[place] Nurse wound follow up Reason for Consult:Re-assessment of sacrum and buttocks. Refer to previous [facility] notes. Wound type:2 unstageable pressure injuries. Sacrum:  2 cm x 2 cm 90% yellow slough, 10% pale pink, tan drainage The moisture associated skin damage to buttocks remains unchanged from previous assessment.  Pressure Injury POA: No Dressing procedure/placement/frequency:Impaired wound healing. He is getting out of bed now and turning self better. Today is asleep in bed and does not assist much.  Continue Santyl ointment to provide enzymatic debridement of nonviable tissue and foam dressing to protect buttocks from further injury and absorb moisture and antifungal powder to moisture associated skin damage areas on buttocks.  Orders updated today.  Will not follow at this time.  Please re-consult if needed.  Domenic Moras RN BSN [place] Pager [phone]

## 2017-03-20 NOTE — Progress Notes (Signed)
Occupational Therapy Treatment Patient Details Name: [name] MRN: [id number] DOB: 1955/07/28 Today's Date: 03/20/2017    History of present illness Pt is a 62 y/o male history of lymphoma in remission admitted with acute on chronic hypoxemic and hypercapnic respiratory failure due to influenza A.  He was also noted to have a new LVEF of 25% with contribution of cardiogenic edema.  He failed extubation on 2/14.  Based on his need for continued ventilatory support he underwent percutaneous tracheostomy on 2/14.   OT comments  Pt making excellent progress and is very motivated to return to PLOF. Pt has progressed from total A to mod A overall with ADL and +2 A (mod from lower levels) with mobility @ RW level. Able to ambulate after completing bedside ADL. On 3L,- Pt desats to 89. O2 increased to 6L, then reduced to 4L during ambulation with O2 sats above 92. All VSS. Supportive sister present during session. Pt is an excellent CIR candidate. Will continue to follow.   Follow Up Recommendations  CIR    Equipment Recommendations  3 in 1 bedside commode(wide)    Recommendations for Other Services Rehab consult    Precautions / Restrictions Precautions Precautions: Fall Precaution Comments: trach capped, on O2 [place] Restrictions Weight Bearing Restrictions: No       Mobility Bed Mobility Overal bed mobility: Needs Assistance Bed Mobility: Supine to Sit;Rolling     Supine to sit: Mod assist     General bed mobility comments: heavy use of rail  Transfers Overall transfer level: Needs assistance Equipment used: Rolling walker (2 wheeled) Transfers: Sit to/from Stand Sit to Stand: +2 physical assistance;Mod assist Stand pivot transfers: +2 physical assistance;Min assist       General transfer comment: increaed A for controlled descent    Balance Overall balance assessment: Needs assistance Sitting-balance support: Feet supported Sitting balance-Leahy Scale: Fair      Standing balance support: Bilateral upper extremity supported;During functional activity Standing balance-Leahy Scale: Poor Standing balance comment: reliant on RW for stability                           ADL either performed or assessed with clinical judgement   ADL   Eating/Feeding: Supervision/ safety;Set up   Grooming: Minimal assistance;Sitting   Upper Body Bathing: Minimal assistance;Sitting   Lower Body Bathing: Moderate assistance;Sit to/from stand   Upper Body Dressing : Minimal assistance Upper Body Dressing Details (indicate cue type and reason): Limited ROM L shoulder Lower Body Dressing: Bed level;Maximal assistance Lower Body Dressing Details (indicate cue type and reason): Able to donn R sock @ bed level Toilet Transfer: BSC;+2 for physical assistance;Minimal assistance;RW   Toileting- Clothing Manipulation and Hygiene: Moderate assistance;Sitting/lateral lean       Functional mobility during ADLs: Minimal assistance;+2 for physical assistance;Moderate assistance(mod A from lower surfaces)       Vision       Perception     Praxis      Cognition Arousal/Alertness: Awake/alert Behavior During Therapy: WFL for tasks assessed/performed Overall Cognitive Status: Within Functional Limits for tasks assessed                                          Exercises Other Exercises Other Exercises: Pt states he has been completing theraband ex; PT with B scapular weakness and would benefit from scapular  strenthening   Shoulder Instructions       General Comments      Pertinent Vitals/ Pain       Pain Assessment: Faces Faces Pain Scale: Hurts a little bit Pain Location: bottom (sitting in chair) Pain Descriptors / Indicators: Discomfort Pain Intervention(s): Limited activity within patient's tolerance  Home Living                                          Prior Functioning/Environment               Frequency  Min 2X/week        Progress Toward Goals  OT Goals(current goals can now be found in the care plan section)  Progress towards OT goals: Progressing toward goals;OT to reassess next treatment(Goals need to be upgraded)  Acute Rehab OT Goals Patient Stated Goal: wanting to work on anything that will make him stronger OT Goal Formulation: With patient Time For Goal Achievement: 04/14/17 Potential to Achieve Goals: Good ADL Goals Pt Will Perform Grooming: with supervision;sitting;with set-up Pt Will Perform Upper Body Bathing: with min assist;sitting - met Pt Will Perform Upper Body Dressing: with mod assist;sitting - met Pt/caregiver will Perform Home Exercise Program: Increased ROM;Increased strength;Both right and left upper extremity;With theraband;Independently;With written HEP provided - progressing Additional ADL Goal #1: Pt will be min A to come up to sit EOB for basic ADLs and transfers  Plan Discharge plan remains appropriate    Co-evaluation    PT/OT/SLP Co-Evaluation/Treatment: Yes Reason for Co-Treatment: Complexity of the patient's impairments (multi-system involvement);For patient/therapist safety;To address functional/ADL transfers PT goals addressed during session: Mobility/safety with mobility OT goals addressed during session: ADL's and self-care;Strengthening/ROM      AM-PAC PT "6 Clicks" Daily Activity     Outcome Measure   Help from another person eating meals?: A Little Help from another person taking care of personal grooming?: A Little Help from another person toileting, which includes using toliet, bedpan, or urinal?: A Lot Help from another person bathing (including washing, rinsing, drying)?: A Lot Help from another person to put on and taking off regular upper body clothing?: A Little Help from another person to put on and taking off regular lower body clothing?: A Lot 6 Click Score: 15    End of Session Equipment Utilized During  Treatment: Oxygen;Gait belt;Rolling walker(3-6L)  OT Visit Diagnosis: Muscle weakness (generalized) (M62.81);Other abnormalities of gait and mobility (R26.89)   Activity Tolerance Patient tolerated treatment well   Patient Left in chair;with call bell/phone within reach;with family/visitor present   Nurse Communication Mobility status        Time: 0312-8118 OT Time Calculation (min): 36 min  Charges: OT General Charges $OT Visit: 1 Visit OT Treatments $Self Care/Home Management : 8-22 mins  [facility], OT/L  [phone] 03/20/2017   Sanora Cunanan,HILLARY 03/20/2017, 1:40 PM

## 2017-03-20 NOTE — Progress Notes (Signed)
Inpatient Rehabilitation  I await updated OT/PT notes prior to initiating insurance authorization with [employer].  Call if questions.   Carmelia Roller., CCC/SLP Admission Coordinator  [place]  Cell [phone]

## 2017-03-21 LAB — GLUCOSE, CAPILLARY
Glucose-Capillary: 102 mg/dL — ABNORMAL HIGH (ref 65–99)
Glucose-Capillary: 121 mg/dL — ABNORMAL HIGH (ref 65–99)
Glucose-Capillary: 98 mg/dL (ref 65–99)
Glucose-Capillary: 103 mg/dL — ABNORMAL HIGH (ref 65–99)

## 2017-03-21 MED ORDER — PANTOPRAZOLE SODIUM 40 MG PO PACK
40.0000 mg | PACK | Freq: Every day | ORAL | Status: DC
  Administered 2017-03-22: 40 mg via ORAL
  Filled 2017-03-21: qty 20

## 2017-03-21 MED ORDER — ALLOPURINOL 300 MG PO TABS
300.0000 mg | ORAL_TABLET | Freq: Every day | ORAL | Status: DC
  Administered 2017-03-22: 300 mg via ORAL
  Filled 2017-03-21: qty 1

## 2017-03-21 MED ORDER — ASPIRIN 81 MG PO CHEW
81.0000 mg | CHEWABLE_TABLET | Freq: Every day | ORAL | Status: DC
  Administered 2017-03-22: 81 mg via ORAL
  Filled 2017-03-21: qty 1

## 2017-03-21 MED ORDER — BUTALBITAL-APAP-CAFFEINE 50-325-40 MG PO TABS: 1.0000 | ORAL_TABLET | Freq: Four times a day (QID) | ORAL | Status: DC | PRN

## 2017-03-21 MED ORDER — ATORVASTATIN CALCIUM 20 MG PO TABS
20.0000 mg | ORAL_TABLET | Freq: Every day | ORAL | Status: DC
  Administered 2017-03-21: 20 mg via ORAL
  Filled 2017-03-21: qty 1

## 2017-03-21 MED ORDER — OXYCODONE HCL 5 MG/5ML PO SOLN: 5.0000 mg | ORAL | Status: DC | PRN

## 2017-03-21 MED ORDER — IBUPROFEN 100 MG/5ML PO SUSP
600.0000 mg | Freq: Four times a day (QID) | ORAL | Status: DC | PRN
  Filled 2017-03-21: qty 30

## 2017-03-21 MED ORDER — AMLODIPINE BESYLATE 5 MG PO TABS
5.0000 mg | ORAL_TABLET | Freq: Every day | ORAL | Status: DC
  Administered 2017-03-22: 5 mg via ORAL
  Filled 2017-03-21: qty 1

## 2017-03-21 MED ORDER — MELATONIN 3 MG PO TABS
6.0000 mg | ORAL_TABLET | Freq: Every day | ORAL | Status: DC
  Administered 2017-03-21: 6 mg via ORAL
  Filled 2017-03-21: qty 2

## 2017-03-21 MED ORDER — CARVEDILOL 25 MG PO TABS
25.0000 mg | ORAL_TABLET | Freq: Two times a day (BID) | ORAL | Status: DC
Start: 2017-03-21 — End: 2017-03-22
  Administered 2017-03-21 – 2017-03-22 (×2): 25 mg via ORAL
  Filled 2017-03-21 (×2): qty 1

## 2017-03-21 MED ORDER — APIXABAN 5 MG PO TABS
5.0000 mg | ORAL_TABLET | Freq: Two times a day (BID) | ORAL | Status: DC
  Administered 2017-03-21 – 2017-03-22 (×2): 5 mg via ORAL
  Filled 2017-03-21 (×2): qty 1

## 2017-03-21 MED ORDER — ACETAMINOPHEN 160 MG/5ML PO SOLN: 650.0000 mg | Freq: Four times a day (QID) | ORAL | Status: DC | PRN

## 2017-03-21 MED ORDER — FEBUXOSTAT 40 MG PO TABS
40.0000 mg | ORAL_TABLET | Freq: Every day | ORAL | Status: DC
  Administered 2017-03-22: 40 mg via ORAL
  Filled 2017-03-21: qty 1

## 2017-03-21 MED ORDER — LORAZEPAM 2 MG/ML PO CONC: 0.5000 mg | ORAL | Status: DC | PRN

## 2017-03-21 MED ORDER — SERTRALINE HCL 50 MG PO TABS
50.0000 mg | ORAL_TABLET | Freq: Every day | ORAL | Status: DC
  Administered 2017-03-22: 50 mg via ORAL
  Filled 2017-03-21: qty 1

## 2017-03-21 MED ORDER — LOSARTAN POTASSIUM 50 MG PO TABS
50.0000 mg | ORAL_TABLET | Freq: Every day | ORAL | Status: DC
  Administered 2017-03-22: 50 mg via ORAL
  Filled 2017-03-21: qty 1

## 2017-03-21 NOTE — Progress Notes (Signed)
Physical Therapy Treatment Patient Details Name: [name] MRN: [id number] DOB: Jan 16, 1955 Today's Date: 03/21/2017    History of Present Illness Pt is a 62 y/o male history of lymphoma in remission admitted with acute on chronic hypoxemic and hypercapnic respiratory failure due to influenza A.  He was also noted to have a new LVEF of 25% with contribution of cardiogenic edema.  He failed extubation on 2/14.  Based on his need for continued ventilatory support he underwent percutaneous tracheostomy on 2/14.    PT Comments    Pt making great progress.  Moving with more ease in general, now generally a 1 person assist and pt now should be able to handle and thrive on the intensity of CIR.   Follow Up Recommendations  CIR     Equipment Recommendations  None recommended by PT    Recommendations for Other Services       Precautions / Restrictions Precautions Precautions: Fall Precaution Comments: trach capped, on O2 [place]    Mobility  Bed Mobility Overal bed mobility: Needs Assistance Bed Mobility: Rolling;Sidelying to Sit Rolling: Min guard Sidelying to sit: Min guard       General bed mobility comments: moderate use of the rail, but no assist to get up or scoot to EOB  Transfers Overall transfer level: Needs assistance Equipment used: Rolling walker (2 wheeled) Transfers: Sit to/from [facility] Sit to Stand: Mod assist;Min assist;+2 physical assistance Stand pivot transfers: Min assist       General transfer comment: pt needed cues for hand placement and assist to mostly come forward with a little boost (to stand), min assist to control sitting.  Ambulation/Gait Ambulation/Gait assistance: Min assist;+2 safety/equipment Ambulation Distance (Feet): 18 Feet(34 feet and 26 feet with rest, respectively) Assistive device: Rolling walker (2 wheeled) Gait Pattern/deviations: Step-through pattern Gait velocity: decreased Gait velocity interpretation: Below  normal speed for age/gender General Gait Details: pt mildly uncoordinated with gait step length and stride.  Quick to fatigue with DOE 2+/4.  RR 30's, sats around 90% for low on 3L, 92% on 4L, EHR int 110's   Stairs            Wheelchair Mobility    Modified Rankin (Stroke Patients Only)       Balance     Sitting balance-Leahy Scale: Fair(good)     Standing balance support: Bilateral upper extremity supported;During functional activity Standing balance-Leahy Scale: Poor Standing balance comment: reliant on RW for stability                            Cognition Arousal/Alertness: Awake/alert Behavior During Therapy: WFL for tasks assessed/performed Overall Cognitive Status: Within Functional Limits for tasks assessed                                        Exercises      General Comments General comments (skin integrity, edema, etc.): Toward end of session pt related HA and beginning to get nauseous.      Pertinent Vitals/Pain Pain Assessment: Faces Faces Pain Scale: Hurts even more Pain Location: R nare with passing scope for test Pain Descriptors / Indicators: Discomfort Pain Intervention(s): Limited activity within patient's tolerance;Monitored during session    Home Living  Prior Function            PT Goals (current goals can now be found in the care plan section) Acute Rehab PT Goals Patient Stated Goal: wanting to work on anything that will make him stronger PT Goal Formulation: With patient Time For Goal Achievement: 04/06/17 Potential to Achieve Goals: Good Progress towards PT goals: Progressing toward goals    Frequency    Min 3X/week      PT Plan Current plan remains appropriate    Co-evaluation              AM-PAC PT "6 Clicks" Daily Activity  Outcome Measure  Difficulty turning over in bed (including adjusting bedclothes, sheets and blankets)?: Unable Difficulty  moving from lying on back to sitting on the side of the bed? : Unable Difficulty sitting down on and standing up from a chair with arms (e.g., wheelchair, bedside commode, etc,.)?: Unable Help needed moving to and from a bed to chair (including a wheelchair)?: A Little Help needed walking in hospital room?: A Little Help needed climbing 3-5 steps with a railing? : A Lot 6 Click Score: 11    End of Session Equipment Utilized During Treatment: Gait belt;Other (comment) Activity Tolerance: Patient tolerated treatment well Patient left: in chair;with call bell/phone within reach;with family/visitor present;with chair alarm set Nurse Communication: Mobility status PT Visit Diagnosis: Other abnormalities of gait and mobility (R26.89)     Time: 9983-3825 PT Time Calculation (min) (ACUTE ONLY): 47 min  Charges:  $Gait Training: 8-22 mins $Therapeutic Activity: 23-37 mins                    G Codes:       2017-04-17  Donnella Sham, PT [phone]  (pager)   Tessie Fass Cherelle Midkiff 04/17/17, 3:10 PM

## 2017-03-21 NOTE — Progress Notes (Signed)
Pt walking with PT in the hall. Pt on 4L [place]/trach capped

## 2017-03-21 NOTE — Progress Notes (Signed)
Dressings changed to sacrum and right ankle wounds as ordered. Patient tolerated procedure well.

## 2017-03-21 NOTE — Progress Notes (Signed)
PULMONARY / CRITICAL CARE MEDICINE   Name: [name] MRN: [id number] DOB: 03/30/1955    ADMISSION DATE:  02/16/2017 CONSULTATION DATE:  02/16/2017  REFERRING MD:  Regenia Skeeter  CHIEF COMPLAINT:  Dyspnea  HISTORY OF PRESENT ILLNESS:   62y/o male with a history of lymphoma in remission, last treated in 2012 who was admitted with ARDS due to influenza , found to have new severe systolic congestive heart failure with an EF of 25%  SUBJECTIVE: Trach capped overnight without difficulty unfortunately the overnight desaturation was not recorded, only monitored  STUDIES:  2/8 echo > EF 25-30% moderately dilated left ventricle, diffuse hypokinesis mild mitral valve regurgitation, left atrium mildly dilated  CULTURES: 2/7 BCx 2 >> negative 2/7 RVP >> influenza A 02/18/2017 BAL>> negative  ANTIBIOTICS: Levaquin 2/3 >2/5; 2/7 x 1 Vancomycin 2/7 >> 2/12 Azactam 2/7 >> 2/12 Tamiflu 2/8 > completed   SIGNIFICANT EVENTS: 2/7 Admit 02/18/2017 intubated for acute respiratory failure 02/18/2017 trach 2/25 tx sdu/trach changed 3/1: trach changed to cuffless XLT.  3/1: using PMV; SLP recommending NPO status d/t delayed swallow  3/4 changed to 5xlt prox, still on 80% but can be weaned  LINES/TUBES: Left chest port >> accessed 2/7 02/18/2017 orotracheal tube>>2/14 02/18/2017 orogastric tube>>2/14 Trach 2/14>>> changed to #6 shiley XLT distal 2/24  3/3 changed to xlt 5  VITAL SIGNS: BP 106/69 (BP Location: Left Wrist)   Pulse 80   Temp 98.2 F (36.8 C) (Oral)   Resp 16   Ht _0  (1.803 m)   Wt (!) 139.5 kg (307 lb 8.7 oz)   SpO2 94%   BMI 42.89 kg/m   INTAKE / OUTPUT: I/O last 3 completed shifts: In: 1330 [P.O.:1320; I.V.:10] Out: 3475 [Urine:3475]  PHYSICAL EXAMINATION:  General - Pleasant male, resting comfortably in exam bed, NAD Eyes - PERRL, EOM-I and MMM ENT - Trach in place, capped Cardiac - RRR, Nl S1/S2 and -M/R/G Chest - CTA bilaterally Abd - Soft, NT, ND and +BS Ext -  - edema and -tenderness Skin - Intact Neuro - Normal strength Psych - Normal mood  LABS:  BMET Recent Labs  Lab 03/18/17 0547 03/19/17 0402 03/20/17 0125  NA 134* 132* 133*  K 4.3 4.7 4.7  CL 98* 96* 96*  CO2 _1 BUN 20 23* 28*  CREATININE 0.61 0.83 0.79  GLUCOSE 107* 101* 108*   CBC Recent Labs  Lab 03/18/17 0547 03/19/17 0402 03/20/17 0125  WBC 11.9* 12.0* 12.7*  HGB 12.5* 11.8* 11.6*  HCT 39.4 37.7* 36.4*  PLT 241 221 229   ABG    Component Value Date/Time   PHART 7.471 (H) 03/14/2017 0420   PCO2ART 42.7 03/14/2017 0420   PO2ART 64.1 (L) 03/14/2017 0420   HCO3 30.7 (H) 03/14/2017 0420   TCO2 32 03/01/2017 1109   ACIDBASEDEF 2.0 02/18/2017 1213   O2SAT 92.3 03/14/2017 0420   Imaging I reviewed CXR myself, trach is in good position  ASSESSMENT / PLAN:  Acute hypoxic respiratory failure in setting of Influenza PNA with ARDS. S/p tracheostomy. Presumed sleep disordered breathing.  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  - Per primary team  Dysphagia.  - Speech following, appreciate input  Deconditioning.  -  Being assessed for CIR  Discussed with TRH-MD  Will f/u in AM on results of overnight desat study  Rush Farmer, M.D. [facility] Pulmonary/Critical Care Medicine. Pager: [phone]. After hours pager: [phone].

## 2017-03-21 NOTE — Progress Notes (Signed)
[place] TEAM 1 - Stepdown/ICU TEAM  [name]  OIN:[id number] DOB: [birth year] DOA: 02/16/2017 PCP: [name], MD    Brief Narrative:  62y/o male with a history of lymphoma in remission (last treated in 2012) who was admitted with ARDS due to influenza, and found to have new severe systolic congestive heart failure with an EF of 25%.    Significant Events: 2/7 admit  2/8 TTE - EF 25-30% - diffuse hypokinesis - mild mitral regurg 2/9 intubated  2/14 tracheostomy  2/24 trach changed to #6 distal XLT due to cuff leak  3/1 trach downsized to #5 cuffless XLT  Subjective: Doing well.  Trach capped.  Working w/ therapy at the time of my visit.  PCCM to record sats th/o night tonight w/ trach capped.  Pt to be transferred to CIR 3/13/      Assessment & Plan:  ARDS due to Influenza A   s/p trach - trach and vent care has been provided by PCCM - doing well w/ plugging of trach    Acute pulmonary edema due to Acute systolic CHF (EF 70%) Cardiology has evaluated - unclear duration/new diagnosis - ?viral CM - coronary eval as outpt as per Cards - cont BB, ARB - cont diuretic   New onset persistent Atrial fibrillation   On eliquis - rate well controlled   Dysphagia  Cont to work w/ SLP - D2 w/ nectar liquids   Hypomagnesemia Corrected   Hypokalemia Corrected   History of Non-Hodgkins lymphoma Has porta-cath in L chest   Bilateral foot wounds Wound care  DM2   CBG controlled   Deconditioning Remains highly motivated to participate in rehab   Morbid obesity - Body mass index is 42.89 kg/m.   DVT prophylaxis: Eliquis  Code Status: FULL CODE Family Communication: spoke w/ signif other at bedside  Disposition Plan: transfer to CIR 3/13  Consultants:  PCCM Cardiology   Antimicrobials:  Levaquin 2/3 >2/5 + 2/7  Vancomycin 2/7 > 2/12 Azactam 2/7 >2/12 Clindamycin 2/26 > 3/3  Objective: Blood pressure 101/81, pulse 75, temperature 98.4 F (36.9 C),  temperature source Oral, resp. rate (!) 21, height '5\' 11"'$  (1.803 m), weight (!) 139.5 kg (307 lb 8.7 oz), SpO2 91 %.  Intake/Output Summary (Last 24 hours) at 03/21/2017 1455 Last data filed at 03/21/2017 0900 Gross per 24 hour  Intake 480 ml  Output 2475 ml  Net -1995 ml   Filed Weights   03/19/17 0400 03/20/17 0135 03/21/17 0436  Weight: (!) 139 kg (306 lb 7 oz) (!) 140.5 kg (309 lb 11.9 oz) (!) 139.5 kg (307 lb 8.7 oz)    Examination: No exam today   CBC: Recent Labs  Lab 03/16/17 0848 03/17/17 0414 03/18/17 0547 03/19/17 0402 03/20/17 0125  WBC 13.3* 13.1* 11.9* 12.0* 12.7*  HGB 12.4* 12.8* 12.5* 11.8* 11.6*  HCT 39.6 40.5 39.4 37.7* 36.4*  MCV 93.6 93.5 93.6 93.8 93.1  PLT 228 245 241 221 962   Basic Metabolic Panel: Recent Labs  Lab 03/16/17 0848 03/17/17 0414 03/18/17 0547 03/19/17 0402 03/20/17 0125  NA 136 135 134* 132* 133*  K 3.9 4.4 4.3 4.7 4.7  CL 99* 99* 98* 96* 96*  CO2 '27 26 27 27 28  '$ GLUCOSE 183* 98 107* 101* 108*  BUN 31* 25* 20 23* 28*  CREATININE 0.68 0.65 0.61 0.83 0.79  CALCIUM 8.6* 8.8* 8.6* 8.4* 8.7*  MG 1.7 2.1 1.8 1.8 1.8   GFR: Estimated Creatinine Clearance: 138.5  mL/min (by C-G formula based on SCr of 0.79 mg/dL).  HbA1C: Hgb A1c MFr Bld  Date/Time Value Ref Range Status  02/16/2017 11:00 PM 6.3 (H) 4.8 - 5.6 % Final    Comment:    (NOTE) Pre diabetes:          5.7%-6.4% Diabetes:              >6.4% Glycemic control for   <7.0% adults with diabetes     CBG: Recent Labs  Lab 03/20/17 1126 03/20/17 1611 03/20/17 2155 03/21/17 0731 03/21/17 1152  GLUCAP 127* 128* 109* 102* 121*     Scheduled Meds: . albuterol  2.5 mg Nebulization TID  . allopurinol  300 mg Per Tube Daily  . amLODipine  5 mg Per Tube Daily  . apixaban  5 mg Per Tube BID  . aspirin  81 mg Per Tube Daily  . atorvastatin  20 mg Per Tube q1800  . carvedilol  25 mg Per Tube BID WC  . chlorhexidine gluconate (MEDLINE KIT)  15 mL Mouth Rinse BID  .  Chlorhexidine Gluconate Cloth  6 each Topical Daily  . collagenase   Topical Daily  . febuxostat  40 mg Per Tube Daily  . feeding supplement (PRO-STAT SUGAR FREE 64)  30 mL Per Tube BID  . furosemide  80 mg Oral BID  . insulin aspart  0-15 Units Subcutaneous TID WC  . losartan  50 mg Per Tube Daily  . mouth rinse  15 mL Mouth Rinse QID  . Melatonin  6 mg Per Tube QHS  . mupirocin cream   Topical Daily  . pantoprazole sodium  40 mg Per Tube Q1200  . potassium chloride  40 mEq Per Tube BID  . sertraline  50 mg Per Tube Daily  . sodium chloride flush  10-40 mL Intracatheter Q12H     LOS: 33 days   Cherene Altes, MD [facility] Office  [phone] Pager - Text Page per Amion as per below:  On-Call/Text Page:      [email or web address]      password [email or web address]  If 7PM-7AM, please contact night-coverage [email or web address] Password [facility] 03/21/2017, 2:55 PM

## 2017-03-21 NOTE — Procedures (Addendum)
Objective Swallowing Evaluation: Type of Study: FEES-Fiberoptic Endoscopic Evaluation of Swallow   Patient Details  Name: [name] MRN: [id number] Date of Birth: September 12, 1955  Today's Date: 03/21/2017 Time: SLP Start Time (ACUTE ONLY): 1209 -SLP Stop Time (ACUTE ONLY): 1255  SLP Time Calculation (min) (ACUTE ONLY): 46 min   Past Medical History:  Past Medical History:  Diagnosis Date  . Dyslipidemia   . Gout attack 01/16/2012  . Hypertension   . nhl dx'd 01/2010  . Venous insufficiency    Past Surgical History:  Past Surgical History:  Procedure Laterality Date  . BRONCHOSCOPY    . ESOPHAGOGASTRODUODENOSCOPY ENDOSCOPY     Dr Jearld Fenton  . INGUINAL HERNIA REPAIR     2001  . PORT-A-CATH REMOVAL     HPI: 62y/o male with a history of lymphoma in remission(last treated in 2012)who was admitted with ARDS due to influenza, andfound to have new severe systolic congestive heart failure with an EF of 25%.Pt was intubated on 2/9, trach placed at bedside on 2/24. #6 distal XLT placed on 2/24 due to cuff leak. Changed to Shiley 4 uncuffed placed on 3/6. Capped 3/7. Pt has a Cortrak.    Subjective: pt alert, pleasant mood    Assessment / Plan / Recommendation  CHL IP CLINICAL IMPRESSIONS 03/21/2017  Clinical Impression Pt shows further improvements from swallowing function since last FEES on 3/5. Although mild, generalized weakness persists, he now has only mild overall residue that clears with Min cues for a chin tuck and multiple swallows. At this point he is only needing 2-3 swallows to adequately clear residue, with more being needed for more solid, dryer textures. Pt also still has mildly impaired timing with thin liquids, which allows thin liquids to reach his true vocal folds before the swallow. He is able to clear this well with a cued cough. With use of a chin tuck, he has good airway protection when taking cup sips of thin liquids but he still deeply penetrates if using a straw.  Recommend advancement to Dys 3 solids and thin liquids by cup, continuing to use a chin tuck and multiple swallows.  SLP stayed after testing and observed clinically as pt consumed thin liquids by cup. He repositioned himself upright again with Mod I for safer intake and utilized a chin tuck to take small sips with Min verbal cues. Recommend additional SLP f/u at CIR level to continue to maximize swallowing function and safety.  SLP Visit Diagnosis Dysphagia, pharyngeal phase (R13.13)  Attention and concentration deficit following --  Frontal lobe and executive function deficit following --  Impact on safety and function Mild aspiration risk      CHL IP TREATMENT RECOMMENDATION 03/21/2017  Treatment Recommendations Therapy as outlined in treatment plan below     Prognosis 03/21/2017  Prognosis for Safe Diet Advancement Good  Barriers to Reach Goals --  Barriers/Prognosis Comment --    CHL IP DIET RECOMMENDATION 03/21/2017  SLP Diet Recommendations Dysphagia 3 (Mech soft) solids;Thin liquid  Liquid Administration via Cup;No straw  Medication Administration Whole meds with puree  Compensations Slow rate;Small sips/bites;Multiple dry swallows after each bite/sip;Chin tuck;Clear throat intermittently  Postural Changes Seated upright at 90 degrees      CHL IP OTHER RECOMMENDATIONS 03/21/2017  Recommended Consults --  Oral Care Recommendations Oral care BID  Other Recommendations --      CHL IP FOLLOW UP RECOMMENDATIONS 03/21/2017  Follow up Recommendations Inpatient Rehab      CHL IP FREQUENCY AND DURATION  03/21/2017  Speech Therapy Frequency (ACUTE ONLY) min 2x/week  Treatment Duration 2 weeks           CHL IP ORAL PHASE 03/21/2017  Oral Phase WFL  Oral - Pudding Teaspoon --  Oral - Pudding Cup --  Oral - Honey Teaspoon --  Oral - Honey Cup --  Oral - Nectar Teaspoon --  Oral - Nectar Cup --  Oral - Nectar Straw --  Oral - Thin Teaspoon --  Oral - Thin Cup --  Oral - Thin  Straw --  Oral - Puree --  Oral - Mech Soft --  Oral - Regular --  Oral - Multi-Consistency --  Oral - Pill --  Oral Phase - Comment --    CHL IP PHARYNGEAL PHASE 03/21/2017  Pharyngeal Phase Impaired  Pharyngeal- Pudding Teaspoon --  Pharyngeal --  Pharyngeal- Pudding Cup --  Pharyngeal --  Pharyngeal- Honey Teaspoon --  Pharyngeal --  Pharyngeal- Honey Cup NT  Pharyngeal --  Pharyngeal- Nectar Teaspoon NT  Pharyngeal --  Pharyngeal- Nectar Cup NT  Pharyngeal --  Pharyngeal- Nectar Straw NT  Pharyngeal --  Pharyngeal- Thin Teaspoon NT  Pharyngeal --  Pharyngeal- Thin Cup Reduced pharyngeal peristalsis;Reduced anterior laryngeal mobility;Reduced laryngeal elevation;Penetration/Aspiration before swallow;Pharyngeal residue - valleculae;Pharyngeal residue - pyriform;Compensatory strategies attempted (with notebox);Delayed swallow initiation-pyriform sinuses  Pharyngeal Material enters airway, CONTACTS cords and not ejected out  Pharyngeal- Thin Straw Reduced pharyngeal peristalsis;Reduced anterior laryngeal mobility;Reduced laryngeal elevation;Pharyngeal residue - valleculae;Pharyngeal residue - pyriform;Compensatory strategies attempted (with notebox);Delayed swallow initiation-pyriform sinuses;Penetration/Aspiration before swallow  Pharyngeal Material enters airway, CONTACTS cords and not ejected out  Pharyngeal- Puree Reduced pharyngeal peristalsis;Reduced anterior laryngeal mobility;Reduced laryngeal elevation;Pharyngeal residue - valleculae;Pharyngeal residue - pyriform;Compensatory strategies attempted (with notebox)  Pharyngeal --  Pharyngeal- Mechanical Soft Reduced pharyngeal peristalsis;Reduced anterior laryngeal mobility;Reduced laryngeal elevation;Pharyngeal residue - valleculae;Pharyngeal residue - pyriform;Compensatory strategies attempted (with notebox)  Pharyngeal --  Pharyngeal- Regular --  Pharyngeal --  Pharyngeal- Multi-consistency --  Pharyngeal --  Pharyngeal-  Pill --  Pharyngeal --  Pharyngeal Comment --     CHL IP CERVICAL ESOPHAGEAL PHASE 03/21/2017  Cervical Esophageal Phase WFL  Pudding Teaspoon --  Pudding Cup --  Honey Teaspoon --  Honey Cup --  Nectar Teaspoon --  Nectar Cup --  Nectar Straw --  Thin Teaspoon --  Thin Cup --  Thin Straw --  Puree --  Mechanical Soft --  Regular --  Multi-consistency --  Pill --  Cervical Esophageal Comment --    No flowsheet data found.  [name] 03/21/2017, 3:04 PM    [name], M.A. CCC-SLP [phone]

## 2017-03-21 NOTE — Progress Notes (Signed)
I met with pt and his friend, Cindy at bedside. I also contacted Dr. Yacoub to discuss RECORDING continuous O2 sat monitor today. We will plan admit to CIR tomorrow pending completion of this study. Patient is in agreement.  I have insurance approval to admit. [phone] 

## 2017-03-22 ENCOUNTER — Inpatient Hospital Stay (HOSPITAL_COMMUNITY)
Admission: RE | Admit: 2017-03-22 | Discharge: 2017-04-06 | DRG: 945 | Disposition: A | Discharge status: Home Health | Payer: BLUE CROSS/BLUE SHIELD | Source: Intra-hospital | Attending: Physical Medicine & Rehabilitation | Admitting: Physical Medicine & Rehabilitation

## 2017-03-22 ENCOUNTER — Encounter (HOSPITAL_COMMUNITY): Payer: Self-pay | Admitting: Physical Medicine and Rehabilitation

## 2017-03-22 DIAGNOSIS — Z87891 Personal history of nicotine dependence: Secondary | ICD-10-CM

## 2017-03-22 DIAGNOSIS — I10 Essential (primary) hypertension: Secondary | ICD-10-CM | POA: Diagnosis present

## 2017-03-22 DIAGNOSIS — I4891 Unspecified atrial fibrillation: Secondary | ICD-10-CM | POA: Diagnosis present

## 2017-03-22 DIAGNOSIS — G4733 Obstructive sleep apnea (adult) (pediatric): Secondary | ICD-10-CM

## 2017-03-22 DIAGNOSIS — Z880 Allergy status to penicillin: Secondary | ICD-10-CM

## 2017-03-22 DIAGNOSIS — C8210 Follicular lymphoma grade II, unspecified site: Secondary | ICD-10-CM

## 2017-03-22 DIAGNOSIS — L97811 Non-pressure chronic ulcer of other part of right lower leg limited to breakdown of skin: Secondary | ICD-10-CM | POA: Diagnosis not present

## 2017-03-22 DIAGNOSIS — T148XXA Other injury of unspecified body region, initial encounter: Secondary | ICD-10-CM | POA: Diagnosis not present

## 2017-03-22 DIAGNOSIS — R0902 Hypoxemia: Secondary | ICD-10-CM | POA: Diagnosis present

## 2017-03-22 DIAGNOSIS — N390 Urinary tract infection, site not specified: Secondary | ICD-10-CM | POA: Diagnosis not present

## 2017-03-22 DIAGNOSIS — Z1612 Extended spectrum beta lactamase (ESBL) resistance: Secondary | ICD-10-CM | POA: Diagnosis present

## 2017-03-22 DIAGNOSIS — Z6841 Body Mass Index (BMI) 40.0 and over, adult: Secondary | ICD-10-CM

## 2017-03-22 DIAGNOSIS — B964 Proteus (mirabilis) (morganii) as the cause of diseases classified elsewhere: Secondary | ICD-10-CM | POA: Diagnosis present

## 2017-03-22 DIAGNOSIS — Z79899 Other long term (current) drug therapy: Secondary | ICD-10-CM

## 2017-03-22 DIAGNOSIS — C8590 Non-Hodgkin lymphoma, unspecified, unspecified site: Secondary | ICD-10-CM | POA: Diagnosis present

## 2017-03-22 DIAGNOSIS — L8990 Pressure ulcer of unspecified site, unspecified stage: Secondary | ICD-10-CM | POA: Diagnosis present

## 2017-03-22 DIAGNOSIS — E871 Hypo-osmolality and hyponatremia: Secondary | ICD-10-CM | POA: Diagnosis not present

## 2017-03-22 DIAGNOSIS — I878 Other specified disorders of veins: Secondary | ICD-10-CM | POA: Diagnosis present

## 2017-03-22 DIAGNOSIS — R05 Cough: Secondary | ICD-10-CM | POA: Diagnosis not present

## 2017-03-22 DIAGNOSIS — Z930 Tracheostomy status: Secondary | ICD-10-CM

## 2017-03-22 DIAGNOSIS — M109 Gout, unspecified: Secondary | ICD-10-CM | POA: Diagnosis not present

## 2017-03-22 DIAGNOSIS — J9601 Acute respiratory failure with hypoxia: Secondary | ICD-10-CM

## 2017-03-22 DIAGNOSIS — E785 Hyperlipidemia, unspecified: Secondary | ICD-10-CM | POA: Diagnosis present

## 2017-03-22 DIAGNOSIS — J9602 Acute respiratory failure with hypercapnia: Secondary | ICD-10-CM

## 2017-03-22 DIAGNOSIS — E1151 Type 2 diabetes mellitus with diabetic peripheral angiopathy without gangrene: Secondary | ICD-10-CM | POA: Diagnosis present

## 2017-03-22 DIAGNOSIS — J80 Acute respiratory distress syndrome: Secondary | ICD-10-CM

## 2017-03-22 DIAGNOSIS — J09X1 Influenza due to identified novel influenza A virus with pneumonia: Secondary | ICD-10-CM

## 2017-03-22 DIAGNOSIS — I83018 Varicose veins of right lower extremity with ulcer other part of lower leg: Secondary | ICD-10-CM | POA: Diagnosis present

## 2017-03-22 DIAGNOSIS — L98429 Non-pressure chronic ulcer of back with unspecified severity: Secondary | ICD-10-CM | POA: Diagnosis not present

## 2017-03-22 DIAGNOSIS — I83028 Varicose veins of left lower extremity with ulcer other part of lower leg: Secondary | ICD-10-CM | POA: Diagnosis present

## 2017-03-22 DIAGNOSIS — E119 Type 2 diabetes mellitus without complications: Secondary | ICD-10-CM

## 2017-03-22 DIAGNOSIS — I5021 Acute systolic (congestive) heart failure: Secondary | ICD-10-CM

## 2017-03-22 DIAGNOSIS — F321 Major depressive disorder, single episode, moderate: Secondary | ICD-10-CM

## 2017-03-22 DIAGNOSIS — J849 Interstitial pulmonary disease, unspecified: Secondary | ICD-10-CM

## 2017-03-22 DIAGNOSIS — M7061 Trochanteric bursitis, right hip: Secondary | ICD-10-CM | POA: Diagnosis not present

## 2017-03-22 DIAGNOSIS — I481 Persistent atrial fibrillation: Secondary | ICD-10-CM | POA: Diagnosis present

## 2017-03-22 DIAGNOSIS — R300 Dysuria: Secondary | ICD-10-CM | POA: Diagnosis present

## 2017-03-22 DIAGNOSIS — L97821 Non-pressure chronic ulcer of other part of left lower leg limited to breakdown of skin: Secondary | ICD-10-CM | POA: Diagnosis not present

## 2017-03-22 DIAGNOSIS — R5381 Other malaise: Secondary | ICD-10-CM | POA: Diagnosis not present

## 2017-03-22 DIAGNOSIS — L089 Local infection of the skin and subcutaneous tissue, unspecified: Secondary | ICD-10-CM | POA: Diagnosis not present

## 2017-03-22 DIAGNOSIS — Z713 Dietary counseling and surveillance: Secondary | ICD-10-CM

## 2017-03-22 DIAGNOSIS — L89150 Pressure ulcer of sacral region, unstageable: Secondary | ICD-10-CM | POA: Diagnosis present

## 2017-03-22 DIAGNOSIS — E876 Hypokalemia: Secondary | ICD-10-CM | POA: Diagnosis present

## 2017-03-22 DIAGNOSIS — F419 Anxiety disorder, unspecified: Secondary | ICD-10-CM | POA: Diagnosis not present

## 2017-03-22 DIAGNOSIS — C8290 Follicular lymphoma, unspecified, unspecified site: Secondary | ICD-10-CM | POA: Diagnosis present

## 2017-03-22 DIAGNOSIS — Z23 Encounter for immunization: Secondary | ICD-10-CM | POA: Diagnosis not present

## 2017-03-22 DIAGNOSIS — I110 Hypertensive heart disease with heart failure: Secondary | ICD-10-CM | POA: Diagnosis not present

## 2017-03-22 LAB — CBC
HEMATOCRIT: 37.7 % — AB (ref 39.0–52.0)
Hemoglobin: 12.1 g/dL — ABNORMAL LOW (ref 13.0–17.0)
MCH: 29.5 pg (ref 26.0–34.0)
MCHC: 32.1 g/dL (ref 30.0–36.0)
MCV: 92 fL (ref 78.0–100.0)
PLATELETS: 261 10*3/uL (ref 150–400)
RBC: 4.1 MIL/uL — ABNORMAL LOW (ref 4.22–5.81)
RDW: 16.2 % — AB (ref 11.5–15.5)
WBC: 12.3 10*3/uL — ABNORMAL HIGH (ref 4.0–10.5)

## 2017-03-22 LAB — URINALYSIS, ROUTINE W REFLEX MICROSCOPIC
BILIRUBIN URINE: NEGATIVE
Glucose, UA: NEGATIVE mg/dL
Hgb urine dipstick: NEGATIVE
KETONES UR: NEGATIVE mg/dL
NITRITE: NEGATIVE
Protein, ur: NEGATIVE mg/dL
Specific Gravity, Urine: 1.008 (ref 1.005–1.030)
pH: 8 (ref 5.0–8.0)

## 2017-03-22 LAB — GLUCOSE, CAPILLARY
GLUCOSE-CAPILLARY: 97 mg/dL (ref 65–99)
Glucose-Capillary: 110 mg/dL — ABNORMAL HIGH (ref 65–99)
Glucose-Capillary: 145 mg/dL — ABNORMAL HIGH (ref 65–99)
Glucose-Capillary: 127 mg/dL — ABNORMAL HIGH (ref 65–99)

## 2017-03-22 LAB — BASIC METABOLIC PANEL
Anion gap: 11 (ref 5–15)
BUN: 20 mg/dL (ref 6–20)
CALCIUM: 8.7 mg/dL — AB (ref 8.9–10.3)
CHLORIDE: 95 mmol/L — AB (ref 101–111)
CO2: 26 mmol/L (ref 22–32)
CREATININE: 0.72 mg/dL (ref 0.61–1.24)
GFR calc Af Amer: >60 mL/min (ref >60)
GFR calc non Af Amer: >60 mL/min (ref >60)
GLUCOSE: 168 mg/dL — AB (ref 65–99)
Potassium: 4 mmol/L (ref 3.5–5.1)
Sodium: 132 mmol/L — ABNORMAL LOW (ref 135–145)

## 2017-03-22 LAB — MAGNESIUM: Magnesium: 1.6 mg/dL — ABNORMAL LOW (ref 1.7–2.4)

## 2017-03-22 MED ORDER — LOSARTAN POTASSIUM 50 MG PO TABS
25.0000 mg | ORAL_TABLET | Freq: Every day | ORAL | Status: DC
  Administered 2017-03-23 – 2017-03-24 (×2): 25 mg via ORAL
  Filled 2017-03-22 (×2): qty 1

## 2017-03-22 MED ORDER — SODIUM CHLORIDE 0.9% FLUSH
10.0000 mL | Freq: Two times a day (BID) | INTRAVENOUS | Status: DC
  Administered 2017-03-25 – 2017-03-29 (×5): 10 mL

## 2017-03-22 MED ORDER — LORAZEPAM 0.5 MG PO TABS: 0.5000 mg | ORAL_TABLET | ORAL | Status: DC | PRN

## 2017-03-22 MED ORDER — PROCHLORPERAZINE EDISYLATE 5 MG/ML IJ SOLN: 5.0000 mg | Freq: Four times a day (QID) | INTRAMUSCULAR | Status: DC | PRN

## 2017-03-22 MED ORDER — INFLUENZA VAC SPLIT QUAD 0.5 ML IM SUSY
0.5000 mL | PREFILLED_SYRINGE | INTRAMUSCULAR | Status: DC
  Filled 2017-03-22 (×2): qty 0.5

## 2017-03-22 MED ORDER — SERTRALINE HCL 50 MG PO TABS: 50.0000 mg | ORAL_TABLET | Freq: Every day | ORAL | 0 refills | Status: DC

## 2017-03-22 MED ORDER — MELATONIN 3 MG PO TABS
6.0000 mg | ORAL_TABLET | Freq: Every day | ORAL | Status: DC
  Administered 2017-03-22 – 2017-04-05 (×15): 6 mg via ORAL
  Filled 2017-03-22 (×15): qty 2

## 2017-03-22 MED ORDER — AMLODIPINE BESYLATE 5 MG PO TABS
5.0000 mg | ORAL_TABLET | Freq: Every day | ORAL | Status: DC
  Administered 2017-03-23: 5 mg via ORAL
  Filled 2017-03-22 (×2): qty 1

## 2017-03-22 MED ORDER — ASPIRIN 81 MG PO CHEW
81.0000 mg | CHEWABLE_TABLET | Freq: Every day | ORAL | Status: DC
  Administered 2017-03-23 – 2017-04-06 (×15): 81 mg via ORAL
  Filled 2017-03-22 (×16): qty 1

## 2017-03-22 MED ORDER — MUPIROCIN CALCIUM 2 % EX CREA: TOPICAL_CREAM | Freq: Every day | CUTANEOUS | 0 refills | Status: DC

## 2017-03-22 MED ORDER — ALBUTEROL SULFATE (2.5 MG/3ML) 0.083% IN NEBU
2.5000 mg | INHALATION_SOLUTION | Freq: Two times a day (BID) | RESPIRATORY_TRACT | Status: DC
  Administered 2017-03-22 – 2017-03-24 (×4): 2.5 mg via RESPIRATORY_TRACT
  Filled 2017-03-22 (×4): qty 3

## 2017-03-22 MED ORDER — CARVEDILOL 25 MG PO TABS
25.0000 mg | ORAL_TABLET | Freq: Two times a day (BID) | ORAL | Status: DC
  Administered 2017-03-22 – 2017-04-06 (×23): 25 mg via ORAL
  Filled 2017-03-22 (×30): qty 1

## 2017-03-22 MED ORDER — COLLAGENASE 250 UNIT/GM EX OINT
TOPICAL_OINTMENT | Freq: Every day | CUTANEOUS | Status: AC
Start: 2017-03-23 — End: 2017-04-03
  Administered 2017-03-23 – 2017-04-03 (×12): via TOPICAL
  Filled 2017-03-22 (×2): qty 30

## 2017-03-22 MED ORDER — ALLOPURINOL 100 MG PO TABS
300.0000 mg | ORAL_TABLET | Freq: Every day | ORAL | Status: DC
  Administered 2017-03-23 – 2017-04-06 (×15): 300 mg via ORAL
  Filled 2017-03-22 (×14): qty 3

## 2017-03-22 MED ORDER — PRO-STAT SUGAR FREE PO LIQD
30.0000 mL | Freq: Two times a day (BID) | ORAL | Status: DC
  Administered 2017-03-22 – 2017-03-31 (×18): 30 mL
  Filled 2017-03-22 (×18): qty 30

## 2017-03-22 MED ORDER — PROCHLORPERAZINE 25 MG RE SUPP: 12.5000 mg | Freq: Four times a day (QID) | RECTAL | Status: DC | PRN

## 2017-03-22 MED ORDER — MUPIROCIN CALCIUM 2 % EX CREA
TOPICAL_CREAM | Freq: Every day | CUTANEOUS | Status: DC
Start: 2017-03-23 — End: 2017-04-04
  Administered 2017-03-23 – 2017-04-03 (×12): via TOPICAL
  Filled 2017-03-22 (×2): qty 15

## 2017-03-22 MED ORDER — MELATONIN 3 MG PO TABS: 6.0000 mg | ORAL_TABLET | Freq: Every day | ORAL | 0 refills | Status: DC

## 2017-03-22 MED ORDER — BISACODYL 10 MG RE SUPP: 10.0000 mg | Freq: Every day | RECTAL | Status: DC | PRN

## 2017-03-22 MED ORDER — POTASSIUM CHLORIDE CRYS ER 20 MEQ PO TBCR
40.0000 meq | EXTENDED_RELEASE_TABLET | Freq: Two times a day (BID) | ORAL | Status: DC
  Administered 2017-03-22 – 2017-04-06 (×30): 40 meq via ORAL
  Filled 2017-03-22 (×30): qty 2

## 2017-03-22 MED ORDER — FUROSEMIDE 20 MG PO TABS: 60.0000 mg | ORAL_TABLET | Freq: Two times a day (BID) | ORAL | 0 refills | Status: DC

## 2017-03-22 MED ORDER — PHENAZOPYRIDINE HCL 100 MG PO TABS
100.0000 mg | ORAL_TABLET | Freq: Three times a day (TID) | ORAL | Status: AC
  Administered 2017-03-23 – 2017-03-24 (×4): 100 mg via ORAL
  Filled 2017-03-22 (×6): qty 1

## 2017-03-22 MED ORDER — ALUM & MAG HYDROXIDE-SIMETH 200-200-20 MG/5ML PO SUSP: 30.0000 mL | ORAL | Status: DC | PRN

## 2017-03-22 MED ORDER — FLEET ENEMA 7-19 GM/118ML RE ENEM: 1.0000 | ENEMA | Freq: Once | RECTAL | Status: DC | PRN

## 2017-03-22 MED ORDER — FEBUXOSTAT 40 MG PO TABS
40.0000 mg | ORAL_TABLET | Freq: Every day | ORAL | Status: DC
  Administered 2017-03-23 – 2017-04-06 (×15): 40 mg via ORAL
  Filled 2017-03-22 (×15): qty 1

## 2017-03-22 MED ORDER — ADULT MULTIVITAMIN W/MINERALS CH
1.0000 | ORAL_TABLET | Freq: Every day | ORAL | Status: DC
  Administered 2017-03-23 – 2017-04-06 (×15): 1 via ORAL
  Filled 2017-03-22 (×15): qty 1

## 2017-03-22 MED ORDER — LOSARTAN POTASSIUM 25 MG PO TABS: 25.0000 mg | ORAL_TABLET | Freq: Every day | ORAL | Status: DC

## 2017-03-22 MED ORDER — BUTALBITAL-APAP-CAFFEINE 50-325-40 MG PO TABS: 1.0000 | ORAL_TABLET | Freq: Four times a day (QID) | ORAL | Status: DC | PRN

## 2017-03-22 MED ORDER — ALBUTEROL SULFATE (2.5 MG/3ML) 0.083% IN NEBU: 2.5000 mg | INHALATION_SOLUTION | Freq: Two times a day (BID) | RESPIRATORY_TRACT | 0 refills | Status: DC

## 2017-03-22 MED ORDER — INSULIN ASPART 100 UNIT/ML ~~LOC~~ SOLN: 0.0000 [IU] | Freq: Three times a day (TID) | SUBCUTANEOUS | 11 refills | Status: DC

## 2017-03-22 MED ORDER — ALBUTEROL SULFATE (2.5 MG/3ML) 0.083% IN NEBU: 2.5000 mg | INHALATION_SOLUTION | Freq: Two times a day (BID) | RESPIRATORY_TRACT | Status: DC

## 2017-03-22 MED ORDER — COLLAGENASE 250 UNIT/GM EX OINT: TOPICAL_OINTMENT | Freq: Every day | CUTANEOUS | 0 refills | Status: DC

## 2017-03-22 MED ORDER — OXYCODONE HCL 5 MG/5ML PO SOLN: 5.0000 mg | ORAL | 0 refills | Status: DC | PRN

## 2017-03-22 MED ORDER — TRAMADOL HCL 50 MG PO TABS: 50.0000 mg | ORAL_TABLET | Freq: Four times a day (QID) | ORAL | Status: DC | PRN

## 2017-03-22 MED ORDER — ASPIRIN 81 MG PO CHEW: 81.0000 mg | CHEWABLE_TABLET | Freq: Every day | ORAL | 0 refills | Status: DC

## 2017-03-22 MED ORDER — ALBUTEROL SULFATE (2.5 MG/3ML) 0.083% IN NEBU: 2.5000 mg | INHALATION_SOLUTION | Freq: Four times a day (QID) | RESPIRATORY_TRACT | Status: DC | PRN

## 2017-03-22 MED ORDER — ACETAMINOPHEN 160 MG/5ML PO SOLN: 650.0000 mg | Freq: Four times a day (QID) | ORAL | 0 refills | Status: DC | PRN

## 2017-03-22 MED ORDER — BUTALBITAL-APAP-CAFFEINE 50-325-40 MG PO TABS: 1.0000 | ORAL_TABLET | Freq: Four times a day (QID) | ORAL | 0 refills | Status: DC | PRN

## 2017-03-22 MED ORDER — FEBUXOSTAT 40 MG PO TABS: 40.0000 mg | ORAL_TABLET | Freq: Every day | ORAL | 0 refills | Status: DC

## 2017-03-22 MED ORDER — POTASSIUM CHLORIDE ER 10 MEQ PO TBCR: 40.0000 meq | EXTENDED_RELEASE_TABLET | Freq: Two times a day (BID) | ORAL | 0 refills | Status: DC

## 2017-03-22 MED ORDER — POLYETHYLENE GLYCOL 3350 17 G PO PACK: 17.0000 g | PACK | Freq: Every day | ORAL | Status: DC | PRN

## 2017-03-22 MED ORDER — VITAMIN C 500 MG PO TABS
500.0000 mg | ORAL_TABLET | Freq: Three times a day (TID) | ORAL | Status: DC
  Administered 2017-03-22 – 2017-04-06 (×44): 500 mg via ORAL
  Filled 2017-03-22 (×45): qty 1

## 2017-03-22 MED ORDER — FUROSEMIDE 40 MG PO TABS
60.0000 mg | ORAL_TABLET | Freq: Two times a day (BID) | ORAL | Status: DC
  Administered 2017-03-22 – 2017-03-23 (×2): 60 mg via ORAL
  Filled 2017-03-22 (×2): qty 1

## 2017-03-22 MED ORDER — INSULIN ASPART 100 UNIT/ML ~~LOC~~ SOLN
0.0000 [IU] | Freq: Three times a day (TID) | SUBCUTANEOUS | Status: DC
  Administered 2017-03-23 – 2017-04-03 (×5): 2 [IU] via SUBCUTANEOUS

## 2017-03-22 MED ORDER — OXYCODONE HCL 5 MG/5ML PO SOLN
5.0000 mg | Freq: Four times a day (QID) | ORAL | Status: DC | PRN
  Administered 2017-03-25: 5 mg via ORAL
  Filled 2017-03-22: qty 5

## 2017-03-22 MED ORDER — APIXABAN 5 MG PO TABS: 5.0000 mg | ORAL_TABLET | Freq: Two times a day (BID) | ORAL | 0 refills | Status: DC

## 2017-03-22 MED ORDER — ACETAMINOPHEN 325 MG PO TABS
325.0000 mg | ORAL_TABLET | ORAL | Status: DC | PRN
  Administered 2017-03-23 – 2017-04-03 (×4): 650 mg via ORAL
  Filled 2017-03-22 (×4): qty 2

## 2017-03-22 MED ORDER — PANTOPRAZOLE SODIUM 40 MG PO PACK
40.0000 mg | PACK | Freq: Every day | ORAL | Status: DC
  Administered 2017-03-23 – 2017-03-26 (×4): 40 mg via ORAL
  Filled 2017-03-22 (×4): qty 20

## 2017-03-22 MED ORDER — LOSARTAN POTASSIUM 25 MG PO TABS: 25.0000 mg | ORAL_TABLET | Freq: Every day | ORAL | 0 refills | Status: DC

## 2017-03-22 MED ORDER — CARVEDILOL 25 MG PO TABS: 25.0000 mg | ORAL_TABLET | Freq: Two times a day (BID) | ORAL | 0 refills | Status: DC

## 2017-03-22 MED ORDER — MAGIC MOUTHWASH W/LIDOCAINE
10.0000 mL | Freq: Four times a day (QID) | ORAL | Status: DC
  Administered 2017-03-22: 10 mL via ORAL
  Filled 2017-03-22 (×10): qty 10

## 2017-03-22 MED ORDER — LOPERAMIDE HCL 2 MG PO CAPS: 4.0000 mg | ORAL_CAPSULE | ORAL | 0 refills | Status: DC | PRN

## 2017-03-22 MED ORDER — PROCHLORPERAZINE MALEATE 5 MG PO TABS: 5.0000 mg | ORAL_TABLET | Freq: Four times a day (QID) | ORAL | Status: DC | PRN

## 2017-03-22 MED ORDER — APIXABAN 5 MG PO TABS
5.0000 mg | ORAL_TABLET | Freq: Two times a day (BID) | ORAL | Status: DC
Start: 2017-03-22 — End: 2017-04-06
  Administered 2017-03-22 – 2017-04-06 (×30): 5 mg via ORAL
  Filled 2017-03-22 (×30): qty 1

## 2017-03-22 MED ORDER — TRAZODONE HCL 50 MG PO TABS: 25.0000 mg | ORAL_TABLET | Freq: Every evening | ORAL | Status: DC | PRN

## 2017-03-22 MED ORDER — FUROSEMIDE 40 MG PO TABS: 60.0000 mg | ORAL_TABLET | Freq: Two times a day (BID) | ORAL | Status: DC

## 2017-03-22 MED ORDER — ONDANSETRON HCL 4 MG PO TABS: 4.0000 mg | ORAL_TABLET | Freq: Three times a day (TID) | ORAL | 0 refills | Status: DC | PRN

## 2017-03-22 MED ORDER — LORAZEPAM 1 MG PO TABS: 0.5000 mg | ORAL_TABLET | Freq: Three times a day (TID) | ORAL | 0 refills | Status: DC

## 2017-03-22 MED ORDER — ATORVASTATIN CALCIUM 20 MG PO TABS
20.0000 mg | ORAL_TABLET | Freq: Every day | ORAL | Status: DC
Start: 2017-03-22 — End: 2017-04-06
  Administered 2017-03-22 – 2017-04-05 (×15): 20 mg via ORAL
  Filled 2017-03-22 (×15): qty 1

## 2017-03-22 MED ORDER — GUAIFENESIN-DM 100-10 MG/5ML PO SYRP: 5.0000 mL | ORAL_SOLUTION | Freq: Four times a day (QID) | ORAL | Status: DC | PRN

## 2017-03-22 MED ORDER — PANTOPRAZOLE SODIUM 40 MG PO PACK: 40.0000 mg | PACK | Freq: Every day | ORAL | 0 refills | Status: DC

## 2017-03-22 MED ORDER — PNEUMOCOCCAL VAC POLYVALENT 25 MCG/0.5ML IJ INJ
0.5000 mL | INJECTION | INTRAMUSCULAR | Status: AC
  Administered 2017-03-23: 0.5 mL via INTRAMUSCULAR
  Filled 2017-03-22: qty 0.5

## 2017-03-22 MED ORDER — SERTRALINE HCL 50 MG PO TABS
50.0000 mg | ORAL_TABLET | Freq: Every day | ORAL | Status: DC
  Administered 2017-03-23 – 2017-04-06 (×15): 50 mg via ORAL
  Filled 2017-03-22 (×15): qty 1

## 2017-03-22 MED ORDER — ONDANSETRON HCL 4 MG/2ML IJ SOLN: 4.0000 mg | Freq: Four times a day (QID) | INTRAMUSCULAR | Status: DC | PRN

## 2017-03-22 MED ORDER — LIDOCAINE HCL 2 % EX GEL
CUTANEOUS | Status: DC | PRN
  Filled 2017-03-22: qty 5

## 2017-03-22 MED ORDER — SODIUM CHLORIDE 0.9% FLUSH: 10.0000 mL | INTRAVENOUS | Status: DC | PRN

## 2017-03-22 MED ORDER — DIPHENHYDRAMINE HCL 12.5 MG/5ML PO ELIX: 12.5000 mg | ORAL_SOLUTION | Freq: Four times a day (QID) | ORAL | Status: DC | PRN

## 2017-03-22 NOTE — Progress Notes (Signed)
Patient's trach was removed & patient was left on 3L [place]. Stoma was covered with gauze. Patient is doing well at this time. All vitals are within normal limits.

## 2017-03-22 NOTE — Progress Notes (Signed)
To Rehab alert and oriented patient per bed accompanied by RN, NT and sister. Fall bundle discussed and fall prevention plan signed by patient. Oriented to unit set up. Assessed skin with Journalist, newspaper.

## 2017-03-22 NOTE — Progress Notes (Signed)
Pt placed on Overnight Oximetry Monitor. Will continue to monitor.

## 2017-03-22 NOTE — Progress Notes (Signed)
I met with patient, friend, Jenny Reichmann, and Dr. Nelda Marseille at bedside. Plan to de cannulate patient this morning, remove PICC and d/c to CIR with CPAP. Plan outpt sleep study after D/c and follow up with Dr. Halford Chessman. Patient is in agreement. I will make the arrangements to admit today. RN CM, Neoma Laming is aware. I will contact Dr. Sherral Hammers. [phone]

## 2017-03-22 NOTE — Progress Notes (Signed)
Occupational Therapy Treatment Patient Details Name: [name] MRN: [id number] DOB: 05-19-1955 Today's Date: 03/22/2017    History of present illness Pt is a 62 y/o male history of lymphoma in remission admitted with acute on chronic hypoxemic and hypercapnic respiratory failure due to influenza A.  He was also noted to have a new LVEF of 25% with contribution of cardiogenic edema.  He failed extubation on 2/14.  Based on his need for continued ventilatory support he underwent percutaneous tracheostomy on 2/14.   OT comments  Pt continues to progress. He is eager to go to rehab and return home. Pt is faithful to his UE HEP with theraband. He demonstrates improvement in ability to perform bed mobility, self feed and groom.   Follow Up Recommendations  CIR    Equipment Recommendations  3 in 1 bedside commode(wide)    Recommendations for Other Services      Precautions / Restrictions Precautions Precautions: Fall Precaution Comments: trach capped, on O2 [place]       Mobility Bed Mobility Overal bed mobility: Needs Assistance Bed Mobility: Rolling;Sidelying to Sit;Sit to Sidelying Rolling: Min guard Sidelying to sit: Min guard     Sit to sidelying: Mod assist General bed mobility comments: moderate use of the rail, but no assist to get up or scoot to EOB, assist for LEs back into bed  Transfers                      Balance Overall balance assessment: Needs assistance   Sitting balance-Leahy Scale: Good                                     ADL either performed or assessed with clinical judgement   ADL   Eating/Feeding: Set up;Bed level Eating/Feeding Details (indicate cue type and reason): continues to need assist opening up some packages Grooming: Wash/dry hands;Wash/dry face;Oral care;Bed level;Set up Grooming Details (indicate cue type and reason): no difficulty managing toothbrush/paste                                      Vision       Perception     Praxis      Cognition Arousal/Alertness: Awake/alert Behavior During Therapy: WFL for tasks assessed/performed Overall Cognitive Status: Within Functional Limits for tasks assessed                                          Exercises Exercises: General Upper Extremity General Exercises - Upper Extremity Shoulder Flexion: Strengthening;Both;10 reps;Theraband;Supine Theraband Level (Shoulder Flexion): Level 2 (Red);Level 1 (Yellow) Elbow Flexion: Strengthening;Both;10 reps;Theraband Theraband Level (Elbow Flexion): Level 2 (Red) Elbow Extension: Strengthening;Both;10 reps;Supine;Theraband Theraband Level (Elbow Extension): Level 2 (Red) Other Exercises Other Exercises: pt reports performing theraband exercises on his own   Shoulder Instructions       General Comments      Pertinent Vitals/ Pain       Pain Assessment: No/denies pain  Home Living  Prior Functioning/Environment              Frequency  Min 2X/week        Progress Toward Goals  OT Goals(current goals can now be found in the care plan section)  Progress towards OT goals: Progressing toward goals  Acute Rehab OT Goals Patient Stated Goal: go to rehab OT Goal Formulation: With patient Time For Goal Achievement: 04/14/17 Potential to Achieve Goals: Good  Plan Discharge plan remains appropriate    Co-evaluation                 AM-PAC PT "6 Clicks" Daily Activity     Outcome Measure   Help from another person eating meals?: A Little Help from another person taking care of personal grooming?: A Little Help from another person toileting, which includes using toliet, bedpan, or urinal?: A Lot Help from another person bathing (including washing, rinsing, drying)?: A Lot Help from another person to put on and taking off regular upper body clothing?: A Little Help from another person  to put on and taking off regular lower body clothing?: A Lot 6 Click Score: 15    End of Session Equipment Utilized During Treatment: Oxygen  OT Visit Diagnosis: Muscle weakness (generalized) (M62.81);Other abnormalities of gait and mobility (R26.89)   Activity Tolerance Patient tolerated treatment well   Patient Left in bed;with call bell/phone within reach;with family/visitor present(MD in room)   Nurse Communication          Time: 2836-6294 OT Time Calculation (min): 21 min  Charges: OT General Charges $OT Visit: 1 Visit OT Treatments $Self Care/Home Management : 8-22 mins  03/22/2017 [name], OTR/L Pager: [phone]   [name] 03/22/2017, 10:39 AM

## 2017-03-22 NOTE — Progress Notes (Signed)
PULMONARY / CRITICAL CARE MEDICINE   Name: [name] MRN: [id number] DOB: 1955-10-16    ADMISSION DATE:  02/16/2017 CONSULTATION DATE:  02/16/2017  REFERRING MD:  Regenia Skeeter  CHIEF COMPLAINT:  Dyspnea  HISTORY OF PRESENT ILLNESS:   62y/o male with a history of lymphoma in remission, last treated in 2012 who was admitted with ARDS due to influenza , found to have new severe systolic congestive heart failure with an EF of 25%  SUBJECTIVE: No events overnight, reports sleeping well  STUDIES:  2/8 echo > EF 25-30% moderately dilated left ventricle, diffuse hypokinesis mild mitral valve regurgitation, left atrium mildly dilated  CULTURES: 2/7 BCx 2 >> negative 2/7 RVP >> influenza A 02/18/2017 BAL>> negative  ANTIBIOTICS: Levaquin 2/3 >2/5; 2/7 x 1 Vancomycin 2/7 >> 2/12 Azactam 2/7 >> 2/12 Tamiflu 2/8 > completed   SIGNIFICANT EVENTS: 2/7 Admit 02/18/2017 intubated for acute respiratory failure 02/18/2017 trach 2/25 tx sdu/trach changed 3/1: trach changed to cuffless XLT.  3/1: using PMV; SLP recommending NPO status d/t delayed swallow  3/4 changed to 5xlt prox, still on 80% but can be weaned  LINES/TUBES: Left chest port >> accessed 2/7 02/18/2017 orotracheal tube>>2/14 02/18/2017 orogastric tube>>2/14 Trach 2/14>>> changed to #6 shiley XLT distal 2/24  3/3 changed to xlt 5  VITAL SIGNS: BP 111/69   Pulse 86   Temp 98.6 F (37 C) (Oral)   Resp (!) 23   Ht _0  (1.803 m)   Wt 291 lb 7.2 oz (132.2 kg)   SpO2 95%   BMI 40.65 kg/m   INTAKE / OUTPUT: I/O last 3 completed shifts: In: 240 [P.O.:240] Out: 3375 [Urine:3375]  PHYSICAL EXAMINATION:  General - Pleasant male, resting comfortably in exam bed with NAD Eyes - PERRL, EOM-I and MMM ENT - Trach capped and in place Cardiac - RRR, Nl S1/S2 and -M/R/G Chest - CTA bilaterally Abd - Soft, NT, ND and +BS Ext - - edema and -tenderness Skin - Intact Neuro - Normal strength Psych - Normal  mood  LABS:  BMET Recent Labs  Lab 03/19/17 0402 03/20/17 0125 03/22/17 0826  NA 132* 133* 132*  K 4.7 4.7 4.0  CL 96* 96* 95*  CO2 _1 BUN 23* 28* 20  CREATININE 0.83 0.79 0.72  GLUCOSE 101* 108* 168*   CBC Recent Labs  Lab 03/19/17 0402 03/20/17 0125 03/22/17 0826  WBC 12.0* 12.7* 12.3*  HGB 11.8* 11.6* 12.1*  HCT 37.7* 36.4* 37.7*  PLT 221 229 261   ABG    Component Value Date/Time   PHART 7.471 (H) 03/14/2017 0420   PCO2ART 42.7 03/14/2017 0420   PO2ART 64.1 (L) 03/14/2017 0420   HCO3 30.7 (H) 03/14/2017 0420   TCO2 32 03/01/2017 1109   ACIDBASEDEF 2.0 02/18/2017 1213   O2SAT 92.3 03/14/2017 0420   Imaging I reviewed CXR myself, trach is in good position I reviewed overnight desat myself, 4 desat episodes (5 if counting one when he was being suctioned) with lowest being 83 that lasted for <2 minutes.  Discussed with RT and PCCM-NP  ASSESSMENT / PLAN:  I spoke with the patient extensively, informed that his desaturation were not terrible and that the overnight desat study is no substitute for sleep study but that recommendations would be for him to go to rehab with trach capped during the day and uncapped at night then have sleep study with trach capped as outpatient then come to clinic to decannulate.  Patient really hates the trach  and would rather have it off.  I spoke with rehab RN and wife as well.  Plan now is to send patient to decannulate at his request, send to CIR with CPAP in place and then once ready for discharge CIR is to make an appointment with Dr. Halford Chessman to see as outpatient for sleep study.  Acute hypoxic respiratory failure in setting of Influenza PNA with ARDS. S/p tracheostomy. 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  - Per primary team  Dysphagia.  - Speech following, appreciate  input  Deconditioning.  - Being assessed for CIR  Plan detail as above  Plan details and discussions took 45 minutes to coordinate  Rush Farmer, M.D. [facility]. Pager: [phone]. After hours pager: [phone].

## 2017-03-22 NOTE — Discharge Summary (Signed)
Physician Discharge Summary  [name] IRW:[id number] DOB: November 24, 1955 DOA: 02/16/2017  PCP: [name] Redwood, MD  Admit date: 02/16/2017 Discharge date: 03/22/2017  Time spent: 35 minutes  Recommendations for Outpatient Follow-up:  ARDS/influenza A pneumonia -S/P trach -Patient has been off vent since 2/26. -continued aggressive pulmonary toilet -Titrate O2 to maintain SPO2> 93%.  -Albuterol TID   Acute Systolic CHF -LVEF = 76% -Strict in and out since admission -11.9 L -Daily weight      Filed Weights    03/20/17 0135 03/21/17 0436 03/22/17 0700  Weight: (!) 309 lb 11.9 oz (140.5 kg) (!) 307 lb 8.7 oz (139.5 kg) 291 lb 7.2 oz (132.2 kg)  -Cardiology following -Viral cardiomyopathy? Coronary eval per cardiology. -Amlodipine 5 mg daily -ASA 81 mg daily -Coreg 25 mg BID -Lasix PO 60 mg BID: Decreased patient's BP borderline for tolerating  physical therapy. -Losartan 25 mg daily: Decreased secondary to patient's BP borderline tolerating physical therapy   New onset atrial fibrillation -Rate controlled -Eliquis 5 mg BID   Acute pulmonary edema -See systolic CHF   Dysphagia  -Scheduled for  FEES 2/28  -Dysphagia 3 diet fluid consistency nectar thin   Hypomagnesemia -Magnesium goal> 2   Hypokalemia -Potassium goal> 4   Non-Hodgkin's lymphoma -Port-A-Cath in LEFT chest -Per patient and wife in remission.   Bilateral foot wounds -Per wound care   DM Type 2   controlled with complication -2/7 Hemoglobin A1c = 6.3  -Moderate SSI   Cellulitis of back -Completed course of antibiotics.   Extreme DECONDITIONING  -stable for discharge to CIR   Anxiety/depression -Ativan PRN -Melatonin 6 mg daily -Zoloft 50 mg daily   Morbid obesity -Body mass index is 44.12 kg/m.      Discharge Diagnoses:  Active Problems:   Respiratory failure, unspecified with hypoxia (HCC)   Acute respiratory failure (HCC)   Acute respiratory failure with hypoxemia (HCC)   Status  post tracheostomy ([place])   Pressure injury of skin   Hypoxic   Tracheostomy status ([place])   Community acquired pneumonia   Critical illness myopathy   Dysphagia   Persistent atrial fibrillation (HCC)   Acute systolic congestive heart failure (HCC)   Non-Hodgkin's lymphoma (HCC)   Benign essential HTN   Leukocytosis   Hypokalemia   Discharge Condition:Stable  Diet recommendation:  Dysphagia 3 fluid consistency thin  Filed Weights   03/20/17 0135 03/21/17 0436 03/22/17 0700  Weight: (!) 309 lb 11.9 oz (140.5 kg) (!) 307 lb 8.7 oz (139.5 kg) 291 lb 7.2 oz (132.2 kg)    History of present illness:  [name] PMHx lymphoma in remission (last treated in 2012), HTN, dyslipidemia, gout   Admitted with ARDS due to influenza, and found to have new severe systolic congestive heart failure with an EF of 25%.  During this hospitalization patient was treated for ARDS secondary to influenza pneumonia which resulted in intubation. Patient failed extubation resulting in tracheostomy. Over month patient had aggressive weaning trials which finally resulted in him being decannulated on 3/13. In addition patient's stay complicated by new onset atrial fibrillation. Patient is morbidly obese with diabetes type 2 controlled with complication. Currently stable for discharge to CIR   Procedures: 2/7 admit  2/8 TTE - EF 25-30% - diffuse hypokinesis - mild mitral regurg 2/9 intubated  2/14 tracheostomy  2/24 trach changed to #6 distal XLT due to cuff leak  3/1 trach change to #6 proximal XLT cuffless 3/6 Trach changed to #4 uncuffed>> 3/13 decannulated  Consultations: [facility]    Antibiotics Anti-infectives (From admission, onward)   Start     Ordered Stop   03/08/17 1200  clindamycin (CLEOCIN) capsule 300 mg     03/08/17 1003 03/12/17 1150   03/07/17 1800  clindamycin (CLEOCIN) capsule 300 mg  Status:  Discontinued     03/07/17 1552 03/08/17 1003   02/20/17 1400  aztreonam (AZACTAM)  1 g in sodium chloride 0.9 % 100 mL IVPB  Status:  Discontinued     02/20/17 0843 02/21/17 1020   02/19/17 0800  vancomycin (VANCOCIN) 1,500 mg in sodium chloride 0.9 % 500 mL IVPB  Status:  Discontinued     02/19/17 0720 02/21/17 1020   02/18/17 2200  oseltamivir (TAMIFLU) 6 MG/ML suspension 75 mg     02/18/17 1412 02/21/17 2112   02/17/17 1000  oseltamivir (TAMIFLU) capsule 75 mg  Status:  Discontinued     02/17/17 0823 02/18/17 1412   02/17/17 0600  vancomycin (VANCOCIN) 1,250 mg in sodium chloride 0.9 % 250 mL IVPB  Status:  Discontinued     02/16/17 2007 02/19/17 0720   02/17/17 0200  aztreonam (AZACTAM) 1 g in dextrose 5 % 50 mL IVPB  Status:  Discontinued     02/16/17 2143 02/20/17 0844   02/16/17 1845  aztreonam (AZACTAM) 2 g in dextrose 5 % 50 mL IVPB     02/16/17 1833 02/16/17 1957   02/16/17 1830  vancomycin (VANCOCIN) 2,500 mg in sodium chloride 0.9 % 500 mL IVPB     02/16/17 1824 02/16/17 2320   02/16/17 1745  levofloxacin (LEVAQUIN) IVPB 750 mg     02/16/17 1735 02/16/17 1957       Discharge Exam: Vitals:   03/22/17 0730 03/22/17 0858 03/22/17 1109 03/22/17 1125  BP: 98/72 111/69 93/72 93/72   Pulse: 85 86  68  Resp: (!) 23   15  Temp: 98.6 F (37 C)   98.8 F (37.1 C)  TempSrc: Oral   Oral  SpO2: 95%   96%  Weight:      Height:        General: A/O 4, positive No acute respiratory distress (now on trach collar) Neck:  Negative scars, masses, torticollis, lymphadenopathy, decannulated area covered and clean.  Lungs: clear to auscultation bilateral, without wheezes or crackles Cardiovascular:  regular rhythm and rate, without murmur gallop or rub normal S1 and S2 Abdomen: MORBIDLY obese, negative abdominal pain, nondistended, positive soft, bowel sounds, no rebound, no ascites, no appreciable mass   Discharge Instructions   Allergies as of 03/22/2017      Reactions   Penicillins Swelling   Age 49 or 37 - pt states he had swelling all over and his lips got  real big      Medication List    STOP taking these medications   BENICAR HCT 40-25 MG tablet Generic drug:  olmesartan-hydrochlorothiazide   levofloxacin 750 MG tablet Commonly known as:  LEVAQUIN     TAKE these medications   acetaminophen 160 MG/5ML solution Commonly known as:  TYLENOL Take 20.3 mLs (650 mg total) by mouth every 6 (six) hours as needed for mild pain or fever.   ADVIL 200 MG Caps Generic drug:  Ibuprofen Take 400 mg by mouth as needed (pain).   albuterol (2.5 MG/3ML) 0.083% nebulizer solution Commonly known as:  PROVENTIL Take 3 mLs (2.5 mg total) by nebulization 2 (two) times daily.   allopurinol 300 MG tablet Commonly known as:  ZYLOPRIM Take 300  mg by mouth daily.   amLODipine 5 MG tablet Commonly known as:  NORVASC Take 5 mg by mouth daily.   apixaban 5 MG Tabs tablet Commonly known as:  ELIQUIS Take 1 tablet (5 mg total) by mouth 2 (two) times daily.   aspirin 81 MG chewable tablet Chew 1 tablet (81 mg total) by mouth daily. Start taking on:  03/23/2017   atorvastatin 20 MG tablet Commonly known as:  LIPITOR Take 20 mg by mouth daily.   butalbital-acetaminophen-caffeine 50-325-40 MG tablet Commonly known as:  FIORICET, ESGIC Take 1-2 tablets by mouth every 6 (six) hours as needed for headache.   carvedilol 25 MG tablet Commonly known as:  COREG Take 1 tablet (25 mg total) by mouth 2 (two) times daily with a meal. What changed:    medication strength  how much to take   collagenase ointment Commonly known as:  SANTYL Apply topically daily. Start taking on:  03/23/2017   CONTRAVE 8-90 MG Tb12 Generic drug:  Naltrexone-buPROPion HCl ER Take 2 tablets by mouth 2 (two) times daily.   febuxostat 40 MG tablet Commonly known as:  ULORIC Take 1 tablet (40 mg total) by mouth daily. Start taking on:  03/23/2017   FISH OIL PO Take 1 tablet by mouth daily.   furosemide 20 MG tablet Commonly known as:  LASIX Take 3 tablets (60 mg  total) by mouth 2 (two) times daily.   insulin aspart 100 UNIT/ML injection Commonly known as:  novoLOG Inject 0-15 Units into the skin 3 (three) times daily with meals.   loperamide 2 MG capsule Commonly known as:  IMODIUM Take 2 capsules (4 mg total) by mouth as needed for diarrhea or loose stools.   LORazepam 1 MG tablet Commonly known as:  ATIVAN Take 0.5-1 tablets (0.5-1 mg total) by mouth every 8 (eight) hours.   losartan 25 MG tablet Commonly known as:  COZAAR Take 1 tablet (25 mg total) by mouth daily. Start taking on:  03/23/2017   Melatonin 3 MG Tabs Take 2 tablets (6 mg total) by mouth at bedtime.   multivitamin-iron-minerals-folic acid chewable tablet Chew 1 tablet by mouth daily.   mupirocin cream 2 % Commonly known as:  BACTROBAN Apply topically daily. Start taking on:  03/23/2017   ondansetron 4 MG tablet Commonly known as:  ZOFRAN Take 1 tablet (4 mg total) by mouth every 8 (eight) hours as needed for nausea or vomiting.   oxyCODONE 5 MG/5ML solution Commonly known as:  ROXICODONE Take 5-7.5 mLs (5-7.5 mg total) by mouth every 3 (three) hours as needed for severe pain.   pantoprazole sodium 40 mg/20 mL Pack Commonly known as:  PROTONIX Take 20 mLs (40 mg total) by mouth daily at 12 noon. Start taking on:  03/23/2017   potassium chloride 10 MEQ tablet Commonly known as:  K-DUR Take 4 tablets (40 mEq total) by mouth 2 (two) times daily.   sertraline 50 MG tablet Commonly known as:  ZOLOFT Take 1 tablet (50 mg total) by mouth daily. Start taking on:  03/23/2017   VITAMIN D-3 PO Take 1 tablet by mouth daily. 500mg       Allergies  Allergen Reactions  . Penicillins Swelling    Age 37 or 16 - pt states he had swelling all over and his lips got real big      The results of significant diagnostics from this hospitalization (including imaging, microbiology, ancillary and laboratory) are listed below for reference.    Significant Diagnostic  Studies: Dg Chest Port 1 View  Result Date: 03/13/2017 CLINICAL DATA:  Patient diagnosed with pneumonia approximately 1 month ago. No current complaints. EXAM: PORTABLE CHEST 1 VIEW COMPARISON:  Single-view of the chest 03/01/2017, 02/28/2017 and 02/16/2017. FINDINGS: Tracheostomy tube, feeding tube, left subclavian catheter and right PICC are unchanged since the most recent examination. Patchy bilateral airspace disease persists. Aeration shows some improvement since 02/27/2017 but is unchanged since the most recent exam. There is cardiomegaly. No pneumothorax. IMPRESSION: Patchy bilateral airspace disease appears unchanged since the most recent study but has improved somewhat since 02/27/2017. Electronically Signed   By: Inge Rise M.D.   On: 03/13/2017 16:34   Dg Chest Port 1 View  Result Date: 03/01/2017 CLINICAL DATA:  Respiratory failure EXAM: PORTABLE CHEST 1 VIEW COMPARISON:  02/28/2017 FINDINGS: Cardiac shadow is enlarged but stable. Tracheostomy tube and feeding catheter are again seen. Left-sided chest wall port and right-sided PICC line are noted as well. Patchy infiltrates are again identified bilaterally and stable. No new focal abnormality is seen. IMPRESSION: Stable patchy infiltrates bilaterally. Tubes and lines as described. Electronically Signed   By: Inez Catalina M.D.   On: 03/01/2017 07:16   Dg Chest Port 1 View  Result Date: 02/28/2017 CLINICAL DATA:  Acute respiratory failure EXAM: PORTABLE CHEST 1 VIEW COMPARISON:  02/27/2017 FINDINGS: Support devices are stable. Patchy severe bilateral airspace disease again noted, unchanged. Low lung volumes. Mild cardiomegaly. IMPRESSION: Low lung volumes with bilateral airspace opacities. No significant change. Electronically Signed   By: Rolm Baptise M.D.   On: 02/28/2017 07:51   Dg Chest Port 1 View  Result Date: 02/27/2017 CLINICAL DATA:  Acute respiratory failure EXAM: PORTABLE CHEST 1 VIEW COMPARISON:  Yesterday FINDINGS:  Cardiopericardial enlargement. Diffuse airspace opacity with stable pattern from yesterday. No visible effusion or pneumothorax. There is a porta catheter from the left with tip at the upper cavoatrial junction. Feeding tube at least reaches the diaphragm. Tracheostomy tube in place. IMPRESSION: History of viral pneumonitis and pulmonary edema. Diffuse airspace opacity is unchanged from yesterday. Electronically Signed   By: Monte Fantasia M.D.   On: 02/27/2017 06:52   Dg Chest Port 1 View  Result Date: 02/26/2017 CLINICAL DATA:  Acute respiratory failure. EXAM: PORTABLE CHEST 1 VIEW COMPARISON:  02/25/2017 FINDINGS: Enteric tube and tracheostomy tube unchanged. Left subclavian central venous catheter unchanged with tip over the SVC. Right-sided PICC line has tip over the SVC unchanged. Lungs are adequately inflated and demonstrate persistent hazy airspace opacification over the mid lungs and lung bases with slight interval improvement likely infection, although edema is possible. Cardiomediastinal silhouette and remainder of the exam is unchanged. IMPRESSION: Persistent hazy airspace process over the mid lungs and lung bases with slight interval improvement. Findings likely due to infection although edema is possible. Tubes and lines as described. Electronically Signed   By: Marin Olp M.D.   On: 02/26/2017 08:37   Dg Chest Port 1 View  Result Date: 02/25/2017 CLINICAL DATA:  Pneumonia EXAM: PORTABLE CHEST 1 VIEW COMPARISON:  02/24/2017 FINDINGS: Tracheostomy in good position. Feeding tube remains in place with the tip not visualized. Right arm PICC tip not well seen overlying the SVC. Port-A-Cath tip in the SVC unchanged Extensive bilateral airspace disease unchanged. No significant pleural effusion. IMPRESSION: Diffuse bilateral airspace disease unchanged. Electronically Signed   By: Franchot Gallo M.D.   On: 02/25/2017 08:21   Dg Chest Port 1 View  Result Date: 02/24/2017 CLINICAL DATA:   62 year old male status  post PICC line placement. EXAM: PORTABLE CHEST 1 VIEW COMPARISON:  1147 hr today, and earlier. FINDINGS: Portable AP semi upright view at 1813 hr. Right upper extremity approach PICC line has been placed. The catheter tip is difficult to identify but probably at the cavoatrial junction level (arrow). Stable tracheostomy tube and left chest porta cath, accessed. Stable ventilation. No pneumothorax. Stable cardiac size and mediastinal contours. IMPRESSION: 1. Right upper extremity approach PICC line placed, tip difficult to visualize but appears to be at the cavoatrial junction level. 2. Otherwise stable chest. Electronically Signed   By: Genevie Ann M.D.   On: 02/24/2017 18:54   Dg Chest Port 1 View  Result Date: 02/24/2017 CLINICAL DATA:  Hypoxia EXAM: PORTABLE CHEST 1 VIEW COMPARISON:  Yesterday FINDINGS: Feeding tube that at least reaches the stomach. Porta catheter on the left with tip the stomach at the SVC. Tracheostomy tube is well seated. Stable cardiomegaly and extensive airspace opacity. No visible effusion or pneumothorax. IMPRESSION: 1. History of viral pneumonitis. Bilateral airspace disease is unchanged from yesterday. 2. Cardiomegaly. Electronically Signed   By: Monte Fantasia M.D.   On: 02/24/2017 12:17   Dg Chest Port 1 View  Result Date: 02/23/2017 CLINICAL DATA:  Trach placement EXAM: PORTABLE CHEST 1 VIEW COMPARISON:  Earlier the same day. FINDINGS: 1615 hours. Endotracheal tube has been removed in the interval and tracheostomy tube now visible. Tip of tracheostomy tube is about 6.5 cm above the base of the carina. The left Port-A-Cath remains in place with tip overlying the distal SVC. Low lung volumes with bibasilar airspace disease, similar to prior. The visualized bony structures of the thorax are intact. Telemetry leads overlie the chest. IMPRESSION: 1. Tracheostomy tube tip projects about 6.5 cm above the carina. 2. Stable appearance patchy mid and lower lobe  airspace opacity. Electronically Signed   By: Misty Stanley M.D.   On: 02/23/2017 16:29   Dg Chest Port 1 View  Result Date: 02/23/2017 CLINICAL DATA:  Pneumonia. EXAM: PORTABLE CHEST 1 VIEW COMPARISON:  02/22/2017. FINDINGS: Unchanged cardiomegaly and BILATERAL pulmonary opacities. Support tubes and lines are stable. No pneumothorax. IMPRESSION: BILATERAL pulmonary opacities could represent a combination of pneumonia and edema. Overall stable appearance compared with priors. Electronically Signed   By: Staci Righter M.D.   On: 02/23/2017 06:46   Dg Chest Port 1 View  Result Date: 02/22/2017 CLINICAL DATA:  Hypoxia EXAM: PORTABLE CHEST 1 VIEW COMPARISON:  February 20, 2017 FINDINGS: Endotracheal tube tip is 4.7 cm above the carina. Nasogastric tube tip and side port are below the diaphragm. Port-A-Cath tip is in superior vena cava. No pneumothorax. There is patchy airspace consolidation in both lower lobes. There is underlying interstitial edema. There is cardiomegaly. The pulmonary vascularity is normal. No adenopathy. No evident bone lesions. IMPRESSION: Tube and catheter positions as described without pneumothorax. Bibasilar airspace consolidation consistent with a degree of pneumonia persists. There is underlying interstitial edema, stable. No new opacity. Stable cardiomegaly. Electronically Signed   By: Lowella Grip III M.D.   On: 02/22/2017 07:04   Dg Chest Port 1v Same Day  Result Date: 02/20/2017 CLINICAL DATA:  Intubation. EXAM: PORTABLE CHEST 1 VIEW COMPARISON:  02/20/2017 at 4:33 a.m. FINDINGS: Nasogastric tube courses into the region of the stomach and off the inferior portion of the film. Endotracheal tube has tip 5.2 cm above the carina. Left subclavian Port-A-Cath unchanged with tip over the SVC. Lungs are adequately inflated with mild interval improvement in the hazy bilateral perihilar  airspace process likely improving interstitial edema versus infection. No definite effusion.  Stable cardiomegaly. Remainder of the exam is unchanged. IMPRESSION: Mild interval improvement of bilateral hazy perihilar airspace opacification likely improving edema or infection. Tubes and lines as described. Electronically Signed   By: Marin Olp M.D.   On: 02/20/2017 16:04    Microbiology: No results found for this or any previous visit (from the past 240 hour(s)).   Labs: Basic Metabolic Panel: Recent Labs  Lab 03/17/17 0414 03/18/17 0547 03/19/17 0402 03/20/17 0125 03/22/17 0826  NA 135 134* 132* 133* 132*  K 4.4 4.3 4.7 4.7 4.0  CL 99* 98* 96* 96* 95*  CO2 26 27 27 28 26   GLUCOSE 98 107* 101* 108* 168*  BUN 25* 20 23* 28* 20  CREATININE 0.65 0.61 0.83 0.79 0.72  CALCIUM 8.8* 8.6* 8.4* 8.7* 8.7*  MG 2.1 1.8 1.8 1.8 1.6*   Liver Function Tests: No results for input(s): AST, ALT, ALKPHOS, BILITOT, PROT, ALBUMIN in the last 168 hours. No results for input(s): LIPASE, AMYLASE in the last 168 hours. No results for input(s): AMMONIA in the last 168 hours. CBC: Recent Labs  Lab 03/17/17 0414 03/18/17 0547 03/19/17 0402 03/20/17 0125 03/22/17 0826  WBC 13.1* 11.9* 12.0* 12.7* 12.3*  HGB 12.8* 12.5* 11.8* 11.6* 12.1*  HCT 40.5 39.4 37.7* 36.4* 37.7*  MCV 93.5 93.6 93.8 93.1 92.0  PLT 245 241 221 229 261   Cardiac Enzymes: No results for input(s): CKTOTAL, CKMB, CKMBINDEX, TROPONINI in the last 168 hours. BNP: BNP (last 3 results) Recent Labs    02/16/17 1723 02/22/17 1100  BNP 251.1* 401.1*    ProBNP (last 3 results) No results for input(s): PROBNP in the last 8760 hours.  CBG: Recent Labs  Lab 03/21/17 1152 03/21/17 1541 03/21/17 2055 03/22/17 0733 03/22/17 1147  GLUCAP 121* 98 103* 110* 145*       Signed:  Dia Crawford, MD [facility] [phone] pager

## 2017-03-22 NOTE — Progress Notes (Signed)
Physical Medicine and Rehabilitation Consult Reason for Consult: Decreased functional mobility Referring Physician: [facility]   HPI: [name] is a 62 y.o. right handed male with history of non-Hodgkin's lymphoma stage III last treated with chemotherapy and radiation 2012, hypertension, chronic bilateral foot wound with ischemic changes. Per chart review, patient lives alone independent prior to admission and still working. He has family in the area that can assist as needed. Presented 02/16/2017 with ARDS due to influenza. Patient did require intubation and tracheostomy 02/23/2017 changed to a #5 cuff less XLT 03/10/2017 with hopes to downsize again soon. He is currently using PMV followed by pulmonary services. Hospital course treated for severe systolic congestive heart failure with noted ejection fraction of 25%. New onset persistent atrial fibrillation follow-up cardiology services placed on Eliquis. Currently NPO with Cortrak tube for nutritional support.WOC follow-up for skin care as directed or sacrum and buttocks and chronic foot wounds. Physical occupational therapy evaluations completed and ongoing questions of possible need for LTAC versus CIR.  Review of Systems  HENT: Negative for hearing loss.   Eyes: Negative for blurred vision and double vision.  Respiratory: Positive for cough and shortness of breath.   Cardiovascular: Positive for leg swelling. Negative for chest pain.  Gastrointestinal: Positive for constipation and nausea. Negative for vomiting.  Genitourinary: Negative for dysuria, flank pain and hematuria.  Musculoskeletal: Positive for joint pain and myalgias.  Skin: Negative for rash.  Neurological: Positive for weakness.  All other systems reviewed and are negative.      Past Medical History:  Diagnosis Date  . Dyslipidemia   . Gout attack 01/16/2012  . Hypertension   . nhl dx'd 01/2010  . Venous insufficiency         Past Surgical History:    Procedure Laterality Date  . BRONCHOSCOPY    . ESOPHAGOGASTRODUODENOSCOPY ENDOSCOPY     Dr Jearld Fenton  . INGUINAL HERNIA REPAIR     2001  . PORT-A-CATH REMOVAL          Family History  Problem Relation Age of Onset  . Colon cancer Neg Hx   . Pancreatic cancer Neg Hx   . Stomach cancer Neg Hx   . Rectal cancer Neg Hx    Social History:  reports that he quit smoking about 5 years ago. He has a 10.00 pack-year smoking history. he has never used smokeless tobacco. He reports that he does not drink alcohol or use drugs. Allergies:       Allergies  Allergen Reactions  . Penicillins Swelling    Age 32 or 80 - pt states he had swelling all over and his lips got real big         Medications Prior to Admission  Medication Sig Dispense Refill  . allopurinol (ZYLOPRIM) 300 MG tablet Take 300 mg by mouth daily.  2  . amLODipine (NORVASC) 5 MG tablet Take 5 mg by mouth daily.      [name] Kitchen atorvastatin (LIPITOR) 20 MG tablet Take 20 mg by mouth daily.   5  . BENICAR HCT 40-25 MG per tablet Take 1 tablet by mouth daily.     . carvedilol (COREG) 12.5 MG tablet Take 12.5 mg by mouth 2 (two) times daily with a meal.     . Cholecalciferol (VITAMIN D-3 PO) Take 1 tablet by mouth daily. 500mg     . CONTRAVE 8-90 MG TB12 Take 2 tablets by mouth 2 (two) times daily.  6  . Ibuprofen (ADVIL)  200 MG CAPS Take 400 mg by mouth as needed (pain).     [name] Kitchen levofloxacin (LEVAQUIN) 750 MG tablet Take 750 mg by mouth daily. x7 days.  0  . multivitamin-iron-minerals-folic acid (CENTRUM) chewable tablet Chew 1 tablet by mouth daily.    . Omega-3 Fatty Acids (FISH OIL PO) Take 1 tablet by mouth daily.      [name] Kitchen ULORIC 40 MG tablet Take 40 mg by mouth daily.  11    Home: Home Living Family/patient expects to be discharged to:: Other (Comment)(LTACH v. SNF depending on vent wean) Living Arrangements: Spouse/significant other Additional Comments: pt on vent and unresponsive throughout   Functional History: Prior Function Level of Independence: Independent Comments: uncertain, pt unresponsive with trach and on vent - per chart review of case management's note, pt previously independent and lives alone Functional Status:  Mobility: Bed Mobility Overal bed mobility: Needs Assistance Bed Mobility: Supine to Sit, Sit to Supine Rolling: Mod assist, +2 for physical assistance Sidelying to sit: Mod assist, +2 for physical assistance Supine to sit: +2 for physical assistance, Mod assist Sit to supine: Max assist, +2 for physical assistance General bed mobility comments: assist for LEs to EOB and to raise trunk, took incr time to come to EOB, pt able to position his hips at EOB with supervision Transfers Overall transfer level: Needs assistance Equipment used: Rolling walker (2 wheeled) Transfers: Sit to/from Stand, Stand Pivot Transfers Sit to Stand: Max assist, +2 physical assistance Stand pivot transfers: Max assist, +2 physical assistance Squat pivot transfers: +2 physical assistance, Max assist(3rd person present for safety. ) General transfer comment: Pt stood to RW with assist to power up, pt with anxiety, but was able to take steps for transfer to chair.  Pt did need assist to control descent.   ADL: ADL Overall ADL's : Needs assistance/impaired Eating/Feeding: NPO Grooming: Moderate assistance, Bed level Upper Body Bathing: Maximal assistance, Bed level Lower Body Bathing: Total assistance, Bed level Upper Body Dressing : Total assistance, Bed level Lower Body Dressing: Total assistance, Bed level General ADL Comments: Pt able to complete hand to mouth movements today.  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 Attempted to position pt in preparation for use of Vital Go tilt for progression to standing. However, when laid flat in preparation to tilt, pt and significant other requesting to sit head up due to  pt feeling that he was unable to breathe (SpO2 93%). At this point, bed controls were not responding. We were able to adjust and regain control of bed controls. However, pt with significant overall pain at this point and deferred further tilting. Positioned B LE to prevent pressing against feet board which has been causing him pain.   Cognition: Cognition Overall Cognitive Status: Difficult to assess Orientation Level: Oriented X4 Cognition Arousal/Alertness: Awake/alert Behavior During Therapy: Anxious Overall Cognitive Status: Difficult to assess General Comments: follows commands, some tangential comments, pt with low volume, difficult to understand at times Difficult to assess due to: Tracheostomy  Blood pressure (!) 71/53, pulse 84, temperature 97.7 F (36.5 C), temperature source Oral, resp. rate 18, height 5\' 11"  (1.803 m), weight (!) 143.9 kg (317 lb 3.9 oz), SpO2 93 %. Physical Exam  Constitutional: He is oriented to person, place, and time. He appears well-developed.  62 year old right-handed obese male  HENT:  Head: Normocephalic and atraumatic.  Nasogastric tube in place  Eyes: EOM are normal. Right eye exhibits no discharge.  Left eye exhibits no discharge.  Neck:  Tracheostomy tube in place  Cardiovascular:  Irregularly irregular  Respiratory:  Fair inspiratory effort Upper airway sounds  GI: Soft. Bowel sounds are normal. He exhibits no distension. There is no tenderness.  Musculoskeletal: He exhibits no tenderness.  LE edema  Neurological: He is alert and oriented to person, place, and time.  Follows full commands Motor: LUE: 3+/5 proximal to distal RUE: 3/5 proximal to distal (some pain inhibition) B/l LE: 4/5 proximal to distal  Skin:  Sacrum and buttock wound not examined  Psychiatric: He has a normal mood and affect. His behavior is normal. Thought content normal.    LabResultsLast24Hours       Results for orders placed or performed during the  hospital encounter of 02/16/17 (from the past 24 hour(s))  Glucose, capillary     Status: Abnormal   Collection Time: 03/12/17  5:26 PM  Result Value Ref Range   Glucose-Capillary 117 (H) 65 - 99 mg/dL  Basic metabolic panel     Status: Abnormal   Collection Time: 03/13/17  2:38 AM  Result Value Ref Range   Sodium 143 135 - 145 mmol/L   Potassium 3.2 (L) 3.5 - 5.1 mmol/L   Chloride 104 101 - 111 mmol/L   CO2 30 22 - 32 mmol/L   Glucose, Bld 111 (H) 65 - 99 mg/dL   BUN 35 (H) 6 - 20 mg/dL   Creatinine, Ser 0.59 (L) 0.61 - 1.24 mg/dL   Calcium 8.6 (L) 8.9 - 10.3 mg/dL   GFR calc non Af Amer >60 >60 mL/min   GFR calc Af Amer >60 >60 mL/min   Anion gap 9 5 - 15  CBC     Status: Abnormal   Collection Time: 03/13/17  2:38 AM  Result Value Ref Range   WBC 13.0 (H) 4.0 - 10.5 K/uL   RBC 4.13 (L) 4.22 - 5.81 MIL/uL   Hemoglobin 12.1 (L) 13.0 - 17.0 g/dL   HCT 38.8 (L) 39.0 - 52.0 %   MCV 93.9 78.0 - 100.0 fL   MCH 29.3 26.0 - 34.0 pg   MCHC 31.2 30.0 - 36.0 g/dL   RDW 16.7 (H) 11.5 - 15.5 %   Platelets 257 150 - 400 K/uL  Glucose, capillary     Status: Abnormal   Collection Time: 03/13/17  5:43 AM  Result Value Ref Range   Glucose-Capillary 109 (H) 65 - 99 mg/dL  Glucose, capillary     Status: Abnormal   Collection Time: 03/13/17  7:46 AM  Result Value Ref Range   Glucose-Capillary 110 (H) 65 - 99 mg/dL  Glucose, capillary     Status: Abnormal   Collection Time: 03/13/17 12:55 PM  Result Value Ref Range   Glucose-Capillary 144 (H) 65 - 99 mg/dL     ImagingResults(Last48hours)  No results found.    Assessment/Plan: Diagnosis: CIM Labs independently reviewed.  Records reviewed and summated above.  1. Does the need for close, 24 hr/day medical supervision in concert with the patient's rehab needs make it unreasonable for this patient to be served in a less intensive setting? Yes  2. Co-Morbidities requiring supervision/potential  complications: dysphagia (advance diet as tolerated), persistent atrial fibrillation (cont meds, monitor HR with increased mobility), severe systolic congestive heart failure (Monitor in accordance with increased physical activity and avoid UE resistance excercises), non-Hodgkin's lymphoma stage III last treated with chemotherapy and radiation 2012, HTN (monitor and provide prns in accordance with increased physical exertion and  pain), chronic bilateral foot wound with ischemic changes, leukocytosis (cont to monitor for signs and symptoms of infection, further workup if indicated), hypokalemia (continue to monitor and replete as necessary) 3. Due to bladder management, safety, skin/wound care, disease management, pain management and patient education, does the patient require 24 hr/day rehab nursing? Yes 4. Does the patient require coordinated care of a physician, rehab nurse, PT (1-2 hrs/day, 5 days/week), OT (1-2 hrs/day, 5 days/week) and SLP (1-2 hrs/day, 5 days/week) to address physical and functional deficits in the context of the above medical diagnosis(es)? Yes Addressing deficits in the following areas: balance, endurance, locomotion, strength, transferring, bathing, dressing, toileting, swallowing and psychosocial support 5. Can the patient actively participate in an intensive therapy program of at least 3 hrs of therapy per day at least 5 days per week? Yes 6. The potential for patient to make measurable gains while on inpatient rehab is excellent 7. Anticipated functional outcomes upon discharge from inpatient rehab are supervision and min assist  with PT, supervision and min assist with OT, modified independent and supervision with SLP. 8. Estimated rehab length of stay to reach the above functional goals is: 18-22 days. 9. Anticipated D/C setting: Home 10. Anticipated post D/C treatments: HH therapy and Home excercise program 11. Overall Rehab/Functional Prognosis:  good  RECOMMENDATIONS: This patient's condition is appropriate for continued rehabilitative care in the following setting: CIR when medically stable and able to tolerate 3 hours of therapy/day. Patient has agreed to participate in recommended program. Yes Note that insurance prior authorization may be required for reimbursement for recommended care.  Comment: Rehab Admissions Coordinator to follow up.  Delice Lesch, MD, ABPMR [name] Paganini Angiulli, PA-C 03/13/2017

## 2017-03-22 NOTE — Progress Notes (Signed)
Pt. set up with CPAP in automode for h/s trial use, humidifier filled w/SW, oxygen added to circuit at 2 lpm, M/L nasal mask used, tolerated well.

## 2017-03-22 NOTE — Progress Notes (Signed)
Orthopedic Tech Progress Note Patient Details:  [name] May 15, 1955 [id number]  Ortho Devices Type of Ortho Device: Haematologist Ortho Device/Splint Location: RLE Ortho Device/Splint Interventions: Ordered, Application   Post Interventions Patient Tolerated: Well Instructions Provided: Care of device   Braulio Bosch 03/22/2017, 4:39 PM

## 2017-03-22 NOTE — H&P (Signed)
Physical Medicine and Rehabilitation Admission H&P    Chief Complaint  Patient presents with  . Debility.     HPI: [name] is a 62 y.o. male with history of Non- Hodgkin's lymphoma -in remission, HTN,  PVD with bilateral foot wounds, morbid obesity who was admitted on 02/16/17 with ARDS due influenza A with sepsis. He was found to have cardiomyopathy with EF 25-30% and new onset of acute systolic CHF. He required intubation on 2/9 with volume overload, hypotension as well as tracheostomy on 2/14. Dr. Percival Spanish following for input on HF as well  as new onset of A fib. Eliquis recommended due to CHA2DS2VASc - 4.  He was weaned off vent with high oxygenation was able to tolerate plugging.   Hypoxia felt to be due to mix of ALI/ARDS after flu and atelectasis d/t body habitus.   He had desaturation on overnight pulse oximetry ( 4-5  episodes lasting < 2 minutes with lowest reading being 83%) and PCCM recommended continuing trach--plugged during the day and open at night but patient hates trach and preferred to have it removed therefore  underwent decannulation today. Overnight pulse oximetery done last night and will  likely need CPAP at nights. Will need outpatient sleep study by Dr. Halford Chessman after discharge. WOC following for input on foot wounds as well as MASD on sacrum/buttocks. He continues to have bouts of anxiety secondary to DOE and -needs 4-6 liters per [place]. Activity levels improving and CIR recommended due to debility.    Review of Systems  Constitutional: Positive for malaise/fatigue. Negative for chills and fever.  HENT: Negative for hearing loss and tinnitus.   Eyes: Negative for blurred vision and double vision.  Respiratory: Positive for shortness of breath.   Cardiovascular: Positive for leg swelling. Negative for chest pain and palpitations.  Gastrointestinal: Positive for nausea (with activity).  Genitourinary: Positive for dysuria and urgency.       Hesitancy    Musculoskeletal: Positive for back pain. Negative for myalgias.  Skin: Negative for itching and rash.  Neurological: Positive for weakness and headaches (with activity). Negative for sensory change and focal weakness.  Psychiatric/Behavioral: The patient is nervous/anxious (worse since hospitalization).   All other systems reviewed and are negative.   Past Medical History:  Diagnosis Date  . Dyslipidemia   . Gout attack 01/16/2012  . Hypertension   . nhl dx'd 01/2010  . Venous insufficiency     Past Surgical History:  Procedure Laterality Date  . BRONCHOSCOPY    . ESOPHAGOGASTRODUODENOSCOPY ENDOSCOPY     Dr Jearld Fenton  . INGUINAL HERNIA REPAIR     2001  . PORT-A-CATH REMOVAL      Family History  Problem Relation Age of Onset  . Colon cancer Neg Hx   . Pancreatic cancer Neg Hx   . Stomach cancer Neg Hx   . Rectal cancer Neg Hx     Social History:  Lives alone. 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  He  reports that he quit smoking about 5 years ago. He has a 10.00 pack-year smoking history. he has never used smokeless tobacco. He reports that he does not drink alcohol or use drugs.    Allergies  Allergen Reactions  . Penicillins Swelling    Age 60 or 27 - pt states he had swelling all over and his lips got real big    Medications Prior to Admission  Medication Sig Dispense Refill  . allopurinol (ZYLOPRIM) 300 MG tablet Take  300 mg by mouth daily.  2  . amLODipine (NORVASC) 5 MG tablet Take 5 mg by mouth daily.      [name] Kitchen atorvastatin (LIPITOR) 20 MG tablet Take 20 mg by mouth daily.   5  . BENICAR HCT 40-25 MG per tablet Take 1 tablet by mouth daily.     . carvedilol (COREG) 12.5 MG tablet Take 12.5 mg by mouth 2 (two) times daily with a meal.     . Cholecalciferol (VITAMIN D-3 PO) Take 1 tablet by mouth daily. 580m    . CONTRAVE 8-90 MG TB12 Take 2 tablets by mouth 2 (two) times daily.  6  . Ibuprofen (ADVIL) 200 MG CAPS Take 400 mg by mouth as needed (pain).      .[name] Kitchenlevofloxacin (LEVAQUIN) 750 MG tablet Take 750 mg by mouth daily. x7 days.  0  . multivitamin-iron-minerals-folic acid (CENTRUM) chewable tablet Chew 1 tablet by mouth daily.    . Omega-3 Fatty Acids (FISH OIL PO) Take 1 tablet by mouth daily.      .[name] KitchenULORIC 40 MG tablet Take 40 mg by mouth daily.  11    Drug Regimen Review  Drug regimen was reviewed and remains appropriate with no significant issues identified  Home: Home Living Family/patient expects to be discharged to:: Other (Comment)([facility]ACH v. SNF depending on vent wean) Living Arrangements: Alone Available Help at Discharge: Family, Friend(s), Available 24 hours/day(sister, RJoseph Artand friend, CJenny Reichmannto assist 24/7) Type of Home: Other(Comment)(two level townhome) Home Access: Stairs to enter ECenter[employer] Energyof Steps: 1 Entrance Stairs-Rails: None Home Layout: Two level, 1/2 bath on main level, Bed/bath upstairs(pt is checking into chair lift for his stairs to second floo) Alternate Level Stairs-Number of Steps: 6 steps then a landing and then 6 more steps Alternate Level Stairs-Rails: Right Bathroom Shower/Tub: Tub/shower unit, WMultimedia programmer Standard Bathroom Accessibility: Yes Additional Comments: verified with pt and friend, CJenny Reichmann Lives With: Alone   Functional History: Prior Function Level of Independence: Independent Comments: Independent; used cane on occasion due to gout  Functional Status:  Mobility: Bed Mobility Overal bed mobility: Needs Assistance Bed Mobility: Rolling, Sidelying to Sit, Sit to Sidelying Rolling: Min guard Sidelying to sit: Min guard Supine to sit: Mod assist Sit to supine: Max assist, +2 for physical assistance Sit to sidelying: Mod assist General bed mobility comments: moderate use of the rail, but no assist to get up or scoot to EOB, assist for LEs back into bed Transfers Overall transfer level: Needs assistance Equipment used: Rolling walker (2  wheeled) Transfers: Sit to/from Stand, Stand Pivot Transfers Sit to Stand: Mod assist, Min assist, +2 physical assistance Stand pivot transfers: Min assist Squat pivot transfers: +2 physical assistance, Max assist(3rd person present for safety. ) General transfer comment: pt needed cues for hand placement and assist to mostly come forward with a little boost (to stand), min assist to control sitting. Ambulation/Gait Ambulation/Gait assistance: Min assist, +2 safety/equipment Ambulation Distance (Feet): 18 Feet(34 feet and 26 feet with rest, respectively) Assistive device: Rolling walker (2 wheeled) Gait Pattern/deviations: Step-through pattern General Gait Details: pt mildly uncoordinated with gait step length and stride.  Quick to fatigue with DOE 2+/4.  RR 30's, sats around 90% for low on 3L, 92% on 4L, EHR int 110's Gait velocity: decreased Gait velocity interpretation: Below normal speed for age/gender    ADL: ADL Overall ADL's : Needs assistance/impaired Eating/Feeding: Set up, Bed level Eating/Feeding Details (indicate cue type and reason): continues to need  assist opening up some packages Grooming: Wash/dry hands, Wash/dry face, Oral care, Bed level, Set up Grooming Details (indicate cue type and reason): no difficulty managing toothbrush/paste Upper Body Bathing: Minimal assistance, Sitting Lower Body Bathing: Moderate assistance, Sit to/from stand Upper Body Dressing : Minimal assistance Upper Body Dressing Details (indicate cue type and reason): Limited ROM L shoulder Lower Body Dressing: Bed level, Maximal assistance Lower Body Dressing Details (indicate cue type and reason): Able to donn R sock @ bed level Toilet Transfer: BSC, +2 for physical assistance, Minimal assistance, RW Toileting- Clothing Manipulation and Hygiene: Moderate assistance, Sitting/lateral lean Functional mobility during ADLs: Minimal assistance, +2 for physical assistance, Moderate assistance(mod A from  lower surfaces) General ADL Comments: Pt drinking thickened liquid with supervision.  Cognition: Cognition Overall Cognitive Status: Within Functional Limits for tasks assessed Orientation Level: Oriented to person Cognition Arousal/Alertness: Awake/alert Behavior During Therapy: WFL for tasks assessed/performed Overall Cognitive Status: Within Functional Limits for tasks assessed General Comments: follows commands, some tangential comments, pt with low volume, difficult to understand at times Difficult to assess due to: Tracheostomy    Blood pressure 111/69, pulse 86, temperature 98.6 F (37 C), temperature source Oral, resp. rate (!) 23, height 5' 11"  (1.803 m), weight 132.2 kg (291 lb 7.2 oz), SpO2 95 %. Physical Exam  Nursing note and vitals reviewed. Constitutional: He is oriented to person, place, and time. He appears well-developed and well-nourished.  Obese male on 4L oxygen per [place]  HENT:  Head: Normocephalic and atraumatic.  Eyes: Conjunctivae and EOM are normal. Pupils are equal, round, and reactive to light. Right eye exhibits no discharge. Left eye exhibits no discharge.  Neck: Normal range of motion. Neck supple.  Trach stoma with dry dressing and intermittent air loss heard  Cardiovascular: Normal rate. An irregularly irregular rhythm present.  Irregularly irregular. Port a cath left chest wall  Respiratory: Effort normal and breath sounds normal. No stridor.  GI: Soft. Bowel sounds are normal. He exhibits no distension. There is no tenderness.  Musculoskeletal: He exhibits no edema or tenderness.  BLE with venous statis changes, dry flaky skin and 1+ pedal edema. Venous stasis ulcer right medial malleolus --wet appearing.   Neurological: He is alert and oriented to person, place, and time. No cranial nerve deficit.  Motor: 4-4+/5 proximal to distal  Skin: Skin is warm and dry.  LE with dressing  Psychiatric: His behavior is normal. Judgment and thought content  normal. His mood appears anxious. He exhibits abnormal recent memory.    Results for orders placed or performed during the hospital encounter of 02/16/17 (from the past 48 hour(s))  Glucose, capillary     Status: Abnormal   Collection Time: 03/20/17 11:26 AM  Result Value Ref Range   Glucose-Capillary 127 (H) 65 - 99 mg/dL  Glucose, capillary     Status: Abnormal   Collection Time: 03/20/17  4:11 PM  Result Value Ref Range   Glucose-Capillary 128 (H) 65 - 99 mg/dL  Glucose, capillary     Status: Abnormal   Collection Time: 03/20/17  9:55 PM  Result Value Ref Range   Glucose-Capillary 109 (H) 65 - 99 mg/dL  Glucose, capillary     Status: Abnormal   Collection Time: 03/21/17  7:31 AM  Result Value Ref Range   Glucose-Capillary 102 (H) 65 - 99 mg/dL  Glucose, capillary     Status: Abnormal   Collection Time: 03/21/17 11:52 AM  Result Value Ref Range   Glucose-Capillary 121 (H) 65 -  99 mg/dL  Glucose, capillary     Status: None   Collection Time: 03/21/17  3:41 PM  Result Value Ref Range   Glucose-Capillary 98 65 - 99 mg/dL  Glucose, capillary     Status: Abnormal   Collection Time: 03/21/17  8:55 PM  Result Value Ref Range   Glucose-Capillary 103 (H) 65 - 99 mg/dL  Glucose, capillary     Status: Abnormal   Collection Time: 03/22/17  7:33 AM  Result Value Ref Range   Glucose-Capillary 110 (H) 65 - 99 mg/dL  Basic metabolic panel     Status: Abnormal   Collection Time: 03/22/17  8:26 AM  Result Value Ref Range   Sodium 132 (L) 135 - 145 mmol/L   Potassium 4.0 3.5 - 5.1 mmol/L   Chloride 95 (L) 101 - 111 mmol/L   CO2 26 22 - 32 mmol/L   Glucose, Bld 168 (H) 65 - 99 mg/dL   BUN 20 6 - 20 mg/dL   Creatinine, Ser 0.72 0.61 - 1.24 mg/dL   Calcium 8.7 (L) 8.9 - 10.3 mg/dL   GFR calc non Af Amer >60 >60 mL/min   GFR calc Af Amer >60 >60 mL/min    Comment: (NOTE) The eGFR has been calculated using the CKD EPI equation. This calculation has not been validated in all clinical  situations. eGFR's persistently <60 mL/min signify possible Chronic Kidney Disease.    Anion gap 11 5 - 15    Comment: Performed at [facility] [address] [place], [place] [address]  Magnesium     Status: Abnormal   Collection Time: 03/22/17  8:26 AM  Result Value Ref Range   Magnesium 1.6 (L) 1.7 - 2.4 mg/dL    Comment: Performed at [facility] [address], [place], [place] [address]  CBC     Status: Abnormal   Collection Time: 03/22/17  8:26 AM  Result Value Ref Range   WBC 12.3 (H) 4.0 - 10.5 K/uL   RBC 4.10 (L) 4.22 - 5.81 MIL/uL   Hemoglobin 12.1 (L) 13.0 - 17.0 g/dL   HCT 37.7 (L) 39.0 - 52.0 %   MCV 92.0 78.0 - 100.0 fL   MCH 29.5 26.0 - 34.0 pg   MCHC 32.1 30.0 - 36.0 g/dL   RDW 16.2 (H) 11.5 - 15.5 %   Platelets 261 150 - 400 K/uL    Comment: Performed at [facility], [place] [address], [place], [place] [address]   No results found.     Medical Problem List and Plan: 1.  Generalized weakness, limitation in self-care secondary to debility. 2.  New onset A fib/DVT Prophylaxis/Anticoagulation: Pharmaceutical: Other (comment)--Eliquis 3. Pain Management: tylenol prn 4. Anxiety disorder/Mood: Continue Zoloft with ativan prn. Team to provide ego support. LCSW to follow for evaluation and support.  5. Neuropsych: This patient is capable of making decisions on his own behalf. 6. Skin/Wound Care: Santyl with wet to dry dressing to areas of breakdown on sacrum.  Encourage pressure relief measures. Maintain adequate nutritional and hydration status.  7. Fluids/Electrolytes/Nutrition: Monitor I/O. Check lytes in am. 8. OSA?Acute hypoxic RF: Continue albuterol nbes tid. Oxygen with activity and CPAP at nights. Sleep study post discharge. 9. Acute systolic CHF: Question viral etiology. Monitor weights daily. Continue Coreg bid, Lasix bid, Cozaar daily, Lipitor and baby ASA.  10. Morbid Obesity: Encourage weight loss to promote health, respiratory status  and mobility 11. Hypokalemia: Managed with supplement 12. New diagnosis  T2DM: HGB A1C- 6.3. Monitor BS ac/hs with SSI for elevated BS. [name] Kitchen Continue  13. Follicular lymphoma: In remission.  14. Chronic BLE edema with venous stasis ulcer: Will order Unna boot--has helped in the past.  Resume TEDs. Protein supplement. Will add vitamins additionally.  15.  Dysuria with hesitancy: Will check UA/US. Check PVRs due to reports of hesitancy.    Post Admission Physician Evaluation: 1. Preadmission assessment reviewed and changes made below. 2. Functional deficits secondary  to debility. 3. Patient is admitted to receive collaborative, interdisciplinary care between the physiatrist, rehab nursing staff, and therapy team. 4. Patient's level of medical complexity and substantial therapy needs in context of that medical necessity cannot be provided at a lesser intensity of care such as a SNF. 5. Patient has experienced substantial functional loss from his/her baseline which was documented above under the "Functional History" and "Functional Status" headings.  Judging by the patient's diagnosis, physical exam, and functional history, the patient has potential for functional progress which will result in measurable gains while on inpatient rehab.  These gains will be of substantial and practical use upon discharge  in facilitating mobility and self-care at the household level. 55. Physiatrist will provide 24 hour management of medical needs as well as oversight of the therapy plan/treatment and provide guidance as appropriate regarding the interaction of the two. 7. 24 hour rehab nursing will assist with safety, disease management and patient education  and help integrate therapy concepts, techniques,education, etc. 8. PT will assess and treat for/with: Lower extremity strength, range of motion, stamina, balance, functional mobility, safety, adaptive techniques and equipment, coping skills, pain control, education.    Goals are: Supervision. 9. OT will assess and treat for/with: ADL's, functional mobility, safety, upper extremity strength, adaptive techniques and equipment, ego support, and community reintegration.   Goals are: Supervision. Therapy may not proceed with showering this patient. 10. SLP will assess and treat for/with: swallowing.  Goals are: Mod I. 11. Case Management and Social Worker will assess and treat for psychological issues and discharge planning. 12. Team conference will be held weekly to assess progress toward goals and to determine barriers to discharge. 13. Patient will receive at least 3 hours of therapy per day at least 5 days per week. 14. ELOS: 10-15 days.       15. Prognosis:  good   Delice Lesch, MD, ABPMR Bary Leriche, PA-C 03/22/2017

## 2017-03-22 NOTE — Progress Notes (Signed)
[name] Gong, RN  Rehab Admission Coordinator  Physical Medicine and Rehabilitation  PMR Pre-admission  Signed  Date of Service:  03/16/2017 1:24 PM       Related encounter: ED to Hosp-Admission (Current) from 02/16/2017 in [facility]      Signed           _0 Hide copied text  _1 Hover for details   PMR Admission Coordinator Pre-Admission Assessment  Patient: [name] is an 62 y.o., male MRN: [id number] DOB: September 01, 1955 Height: _2  (180.3 cm) Weight: (!) 140.6 kg (309 lb 15.5 oz)                                                                                                                                                  Insurance Information HMO:     PPO:      PCP:      IPA:      80/20:      OTHER: ACA policy, blue advantage silver PRIMARY: BCBS of [place]      Policy#: [id number]      Subscriber: pt CM Name: Harrie Jeans    Phone#: [phone]     Fax#: [phone] Pre-Cert#: [id number] approved through 3/20 when updates are due      Employer: self Benefits:  Phone #: [phone]     Name: 3/7 Eff. Date: 01/10/2017     Deduct: $4000      Out of Pocket Max: $7900 includes deductible      Life Max: none CIR: 70%      SNF: 70% 60 days Outpatient: $40 per visit     Co-Pay: 30 visits each discipline Home Health: 70%      Co-Pay: visits per medical neccesity DME: 70%     Co-Pay: 30% Providers: in network  SECONDARY: none        Medicaid Application Date:       Case Manager:  Disability Application Date:       Case Worker:   Emergency Tax adviser Information    Name Relation Home Work Mobile   [place] Sister  [phone]     Current Medical History  Patient Admitting Diagnosis: CIM  History of Present Illness:    : XKP:[id number] D Huntis a 62 y.o.right handed malewith history of non-Hodgkin's lymphoma stage III last treated with chemotherapy and radiation 2012and has a Port-A-Cath in  his left chest, hypertension, chronic bilateral foot wound with ischemic changes.Presented 02/16/2017 with ARDS due to influenza. Patient did require intubation and tracheostomy 02/23/2017  and downsized to a #4 with trach being capped. He is currently usingPMVfollowed by pulmonary services.Recorder continuous )2 sat monitor last pm 03/21/2017. Dr. Nelda Marseille plans to de cannulate today prior to admit to CIR and use of CPAP at CIR at [facility]. Will  need outpatient sleep study later d/c and follow up with PCCM .Latest blood cultures negative. Tamiflu completed 02/17/2017.Hospital course treated for severe systolic congestive heart failure with noted ejection fraction of 25%moderately dilated left ventricle, diffuse hypokinesis. New onset persistent atrial fibrillation follow-up cardiology services placed on Eliquis.Diet has been advanced to a dysphagia #2 nectar thick liquid.WOCfollow-up for skin care as directed or sacrum and buttocks and chronic foot wounds.   Past Medical History      Past Medical History:  Diagnosis Date  . Dyslipidemia   . Gout attack 01/16/2012  . Hypertension   . nhl dx'd 01/2010  . Venous insufficiency     Family History  family history is not on file.  Prior Rehab/Hospitalizations:  Has the patient had major surgery during 100 days prior to admission? No  Current Medications   Current Facility-Administered Medications:  .  acetaminophen (TYLENOL) solution 650 mg, 650 mg, Per Tube, Q6H PRN, Yopp, Amber C, RPH, 650 mg at 03/12/17 1830 .  albuterol (PROVENTIL) (2.5 MG/3ML) 0.083% nebulizer solution 2.5 mg, 2.5 mg, Nebulization, Q4H PRN, Cherene Altes, MD .  allopurinol (ZYLOPRIM) tablet 300 mg, 300 mg, Per Tube, Daily, Cherene Altes, MD, 300 mg at 03/16/17 0912 .  amLODipine (NORVASC) tablet 5 mg, 5 mg, Per Tube, Daily, Simonne Maffucci B, MD, 5 mg at 03/16/17 0912 .  apixaban (ELIQUIS) tablet 5 mg, 5 mg, Per Tube, BID, Simonne Maffucci B, MD, 5 mg at  03/16/17 0912 .  aspirin chewable tablet 81 mg, 81 mg, Per Tube, Daily, Erick Colace, NP, 81 mg at 03/16/17 0912 .  atorvastatin (LIPITOR) tablet 20 mg, 20 mg, Per Tube, q1800, Erick Colace, NP, 20 mg at 03/15/17 1715 .  butalbital-acetaminophen-caffeine (FIORICET, ESGIC) 50-325-40 MG per tablet 1-2 tablet, 1-2 tablet, Per Tube, Q6H PRN, Cherene Altes, MD, 1 tablet at 03/08/17 2318 .  carvedilol (COREG) tablet 25 mg, 25 mg, Per Tube, BID WC, Kathyrn Drown D, NP, 25 mg at 03/16/17 0912 .  chlorhexidine gluconate (MEDLINE KIT) (PERIDEX) 0.12 % solution 15 mL, 15 mL, Mouth Rinse, BID, Rush Farmer, MD, 15 mL at 03/16/17 0913 .  Chlorhexidine Gluconate Cloth 2 % PADS 6 each, 6 each, Topical, Daily, Cherene Altes, MD, 6 each at 03/15/17 0940 .  collagenase (SANTYL) ointment, , Topical, Daily, Joette Catching T, MD .  febuxostat (ULORIC) tablet 40 mg, 40 mg, Per Tube, Daily, Cherene Altes, MD, 40 mg at 03/16/17 0911 .  feeding supplement (PRO-STAT SUGAR FREE 64) liquid 30 mL, 30 mL, Per Tube, BID, Allie Bossier, MD, 30 mL at 03/16/17 0911 .  feeding supplement (VITAL HIGH PROTEIN) liquid 1,000 mL, 1,000 mL, Per Tube, Continuous, Allie Bossier, MD, Stopped at 03/16/17 0800 .  furosemide (LASIX) tablet 80 mg, 80 mg, Oral, BID, Cherene Altes, MD, 80 mg at 03/16/17 0912 .  hydrALAZINE (APRESOLINE) injection 10-40 mg, 10-40 mg, Intravenous, Q4H PRN, Rigoberto Noel, MD .  ibuprofen (ADVIL,MOTRIN) 100 MG/5ML suspension 600 mg, 600 mg, Per Tube, Q6H PRN, Cherene Altes, MD, 600 mg at 03/13/17 1820 .  insulin aspart (novoLOG) injection 0-15 Units, 0-15 Units, Subcutaneous, Q4H, Collene Gobble, MD, 2 Units at 03/16/17 0019 .  loperamide (IMODIUM) capsule 4 mg, 4 mg, Oral, PRN, Allie Bossier, MD .  LORazepam (ATIVAN) 2 MG/ML concentrated solution 0.5-1 mg, 0.5-1 mg, Per Tube, Q4H PRN, Cherene Altes, MD, 1 mg at 03/10/17 2158 .  losartan (COZAAR) tablet  50 mg, 50 mg,  Per Tube, Daily, Minus Breeding, MD, 50 mg at 03/16/17 0912 .  MEDLINE mouth rinse, 15 mL, Mouth Rinse, QID, Rush Farmer, MD, 15 mL at 03/16/17 0333 .  Melatonin TABS 6 mg, 6 mg, Per Tube, QHS, Cherene Altes, MD, 6 mg at 03/15/17 2120 .  mupirocin cream (BACTROBAN) 2 %, , Topical, Daily, Hammonds, Sharyn Blitz, MD .  ondansetron [facility]) injection 4 mg, 4 mg, Intravenous, Q6H PRN, Mauri Brooklyn, MD, 4 mg at 02/18/17 7408 .  oxyCODONE (ROXICODONE) 5 MG/5ML solution 5-7.5 mg, 5-7.5 mg, Per Tube, Q3H PRN, Cherene Altes, MD, 5 mg at 03/15/17 0900 .  pantoprazole sodium (PROTONIX) 40 mg/20 mL oral suspension 40 mg, 40 mg, Per Tube, Q1200, Erick Colace, NP, 40 mg at 03/15/17 1245 .  potassium chloride 20 MEQ/15ML (10%) solution 40 mEq, 40 mEq, Per Tube, BID, Cherene Altes, MD, 40 mEq at 03/16/17 0911 .  RESOURCE THICKENUP CLEAR, , Oral, PRN, Allie Bossier, MD .  sertraline (ZOLOFT) 20 MG/ML concentrated solution 50 mg, 50 mg, Per Tube, Daily, Cherene Altes, MD, 50 mg at 03/16/17 0911 .  sodium chloride flush (NS) 0.9 % injection 10-40 mL, 10-40 mL, Intracatheter, Q12H, McQuaid, Douglas B, MD, 10 mL at 03/16/17 1448  Facility-Administered Medications Ordered in Other Encounters:  .  sodium chloride 0.9 % injection 10 mL, 10 mL, Intravenous, PRN, Curt Bears, MD, 10 mL at 05/04/15 1529  Patients Current Diet: DIET DYS 2 Room service appropriate? 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 cane as needed with his gout flares  Prior Functional Level Prior Function Level of Independence: Independent Comments: Independent; used cane on occasion due to gout  Self Care: Did the patient need help bathing, dressing, using the toilet or eating?  Independent  Indoor Mobility: Did the patient need assistance with walking from room to room (with or without device)? Independent  Stairs: Did the patient need assistance with internal or external stairs (with or without device)? Independent  Functional Cognition: Did the patient need help planning regular tasks such as shopping or remembering to take medications? Independent  Current Functional Level Cognition  Overall Cognitive Status: Within Functional Limits for tasks assessed Difficult to assess due to: Tracheostomy Orientation Level: Oriented X4 General Comments: follows commands, some tangential comments, pt with low volume, difficult to understand at times    Extremity Assessment (includes Sensation/Coordination)  Upper Extremity Assessment: Generalized weakness RUE Deficits / Details: moving both arms and hands grossly 2/5-3/5 LUE Deficits / Details: moving both arms and hands grossly 2/5-3/5  Lower Extremity Assessment: RLE deficits/detail, LLE deficits/detail RLE Deficits / Details: pt with random spontaneous movements through partial ROM for hip abduction/adduction; no response to noxious stimuli LLE Deficits / Details: pt with random spontaneous movements through partial ROM for hip abduction/adduction; no response to noxious stimuli    ADLs  Overall ADL's : Needs assistance/impaired Eating/Feeding: NPO Grooming: Minimal assistance, Sitting Upper Body Bathing: Maximal assistance, Bed level Lower Body Bathing: Total assistance, Bed  level Upper Body Dressing : Minimal assistance, Sitting Lower Body Dressing: Sit to/from stand, Total assistance Toilet  Transfer: +2 for physical assistance, Minimal assistance, Stand-pivot, RW Toileting- Clothing Manipulation and Hygiene: Total assistance, Sit to/from stand General ADL Comments: Pt drinking thickened liquid with supervision.    Mobility  Overal bed mobility: Needs Assistance Bed Mobility: Rolling, Sidelying to Sit Rolling: Mod assist Sidelying to sit: Mod assist Supine to sit: +2 for physical assistance, Mod assist Sit to supine: Max assist, +2 for physical assistance General bed mobility comments: increased time, assist for LEs over EOB and to raise trunk    Transfers  Overall transfer level: Needs assistance Equipment used: Rolling walker (2 wheeled) Transfers: Sit to/from Stand, Stand Pivot Transfers Sit to Stand: +2 physical assistance, Min assist, From elevated surface Stand pivot transfers: +2 physical assistance, Min assist Squat pivot transfers: +2 physical assistance, Max assist(3rd person present for safety. ) General transfer comment: stood with assist to steady and use of momentum, R hand on RW with L hand pushing up from elevated bed    Ambulation / Gait / Stairs / Wheelchair Mobility       Posture / Balance Dynamic Sitting Balance Sitting balance - Comments: statically, arms resting on RW Balance Overall balance assessment: Needs assistance Sitting-balance support: Single extremity supported, Feet unsupported Sitting balance-Leahy Scale: Fair Sitting balance - Comments: statically, arms resting on RW Postural control: Posterior lean, Left lateral lean Standing balance support: Bilateral upper extremity supported, During functional activity Standing balance-Leahy Scale: Poor Standing balance comment: reliant on light B UE support    Special needs/care consideration BiPAP/CPAP New to CPAP at HS on CIR. Will need outpatient sleep study after d/c and follow up with PCCM CPM  N/a Continuous Drip IV  N/a Dialysis  N/a Life Vest  N/a Oxygen ) 2 at 3 liters  nasal cannula. Not used pta.  Special Bed n/a Trach Size to be decannulate today 3/13 prior to admit to CIR. Wound Vac n/a Skin     RN  Registered Nurse  WOC  Consult Note   Addendum   Date of Service:  03/14/2017 11:26 AM                 _0 Hide copied text  _1 Hover for details   [place] Nurse woundfollow-upnote Reason for Consult:Re-assessment ofsacrum and buttocks. Refer to previous [facility] notes. Wound type:Pt was previously noted to have a 8X8cm dark reddish purple area to buttocks and patchy areas of moisture associated skin damage and peeling skinand candidiasis.2 areas have evolved into unstageable pressure injuries. The moisture associated skin damageto buttocksremains, sacrum has  unstageable pressure injury; 1.8X.2cm, 100% yellow slough, mod amt tan drainage, no odor, unchanged from previous assessment. Therewerepreviously2 areas of full thickness wounds to right buttocks where skin has peeled; one has evolved into an unstageable pressure injury; 1X1X.1cm, 80% yellow, 20% red and moist, small amt tan drainage, no odor Pressure Injury POA: No Dressing procedure/placement/frequency:Pt has a high BMI and has multiple systemic factors which can impair healing.He is getting out of bed now and turning self better. ContinueSantyl ointment to provide enzymatic debridement of nonviable tissue and foam dressing to protect buttocks from further injury and absorb moistureand antifungal powder to moisture associated skin damage areas on buttocks.Discussed plan of care with patient and family member at the bedside. Please re-consult if further assistance is needed. Thank-you,  Julien Girt MSN, [place], [place], [place], [place]  Jeannie Fend, RN  Registered Nurse  WOC  Consult Note   Addendum   Date of Service:  03/20/2017 12:50 PM                 _0 Hide copied text  _1 Hover for details   [place] Nurse wound follow  up Reason for Consult:Re-assessment ofsacrum and buttocks. Refer to previous [facility] notes. Wound type:2unstageable pressure injuries. Sacrum: 2 cm x 2 cm 90% yellow slough, 10% pale pink, tan drainage The moisture associated skin damageto buttocksremains unchanged from previous assessment.  Pressure Injury POA: No Dressing procedure/placement/frequency:Impaired wound healing.He is getting out of bed now and turning self better. Today is asleep in bed and does not assist much. ContinueSantyl ointment to provide enzymatic debridement of nonviable tissue and foam dressing to protect buttocks from further injury and absorb moistureand antifungal powder to moisture associated skin damage areas on buttocks.Orders updated today. Will not follow at this time. Please re-consult if needed.  Domenic Moras RN BSN CWON Pager [phone]          Revision History                        Routing History                Bowel mgmt: some incontinence with diarrhea Bladder mgmt: continent with urinal use Diabetic mgmt  N/a   Previous Home Environment Living Arrangements: Alone  Lives With: Alone Available Help at Discharge: Family, Friend(s), Available 24 hours/day(sister, [name] Art and friend, Jenny Reichmann to assist 24/7) Type of Home: Other(Comment)(two level townhome) Home Layout: Two level, 1/2 bath on main level, Bed/bath upstairs(pt is checking into chair lift for his stairs to second floo) Alternate Level Stairs-Rails: Right Alternate Level Stairs-Number of Steps: 6 steps then a landing and then 6 more steps Home Access: Stairs to enter Entrance Stairs-Rails: None Entrance Stairs-Number of Steps: 1 Bathroom Shower/Tub: Public librarian, Multimedia programmer: Standard Bathroom Accessibility: Yes How Accessible: Accessible via walker [place]: No Additional Comments: verified with pt and friend, [place]  Discharge  Living Setting Plans for Discharge Living Setting: Patient's home, Alone Type of Home at Discharge: Other (Comment)(two level townhome) Discharge Home Layout: Two level, 1/2 bath on main level, Bed/bath upstairs Alternate Level Stairs-Rails: Right Alternate Level Stairs-Number of Steps: 6 steps. a landing and then 6 more steps Discharge Home Access: Stairs to enter Entrance Stairs-Rails: None Entrance Stairs-Number of Steps: 1 Discharge Bathroom Shower/Tub: Tub/shower unit, Horticulturist, commercial: Standard Discharge Bathroom Accessibility: Yes How Accessible: Accessible via walker Does the patient have any problems obtaining your medications?: No  Social/Family/Support Systems Patient Roles: (buisiness owner) Contact Information: sister, [name] Art, is main contact Anticipated Caregiver: sister, [name] Art, and friend, Programmer, systems Anticipated Ambulance person Information: see above Ability/Limitations of Caregiver: no limitations Caregiver Availability: 24/7 Discharge Plan Discussed with Primary Caregiver: Yes Is Caregiver In Agreement with Plan?: Yes Does Caregiver/Family have Issues with Lodging/Transportation while Pt is in Rehab?: No  Goals/Additional Needs Patient/Family Goal for Rehab: supervision to min assist with PT, OT, and SLP Expected length of stay: ELOS 18 to 22 days Pt/Family Agrees to Admission and willing to participate: Yes Program Orientation Provided & Reviewed with Pt/Caregiver Including Roles  & Responsibilities: Yes  Decrease burden of Care through IP rehab admission: n/a  Possible need for SNF placement upon discharge: not anticipated  Patient Condition: This patient's medical and functional status has changed since the consult dated: 03/13/2017 in which  the Rehabilitation Physician determined and documented that the patient's condition is appropriate for intensive rehabilitative care in an inpatient rehabilitation facility. See "History of Present  Illness" (above) for medical update. Functional changes are: overall min to mod assist. Patient's medical and functional status update has been discussed with the Rehabilitation physician and patient remains appropriate for inpatient rehabilitation. Will admit to inpatient rehab today.  Preadmission Screen Completed By:  Cleatrice Burke, 03/16/2017 1:24 PM ______________________________________________________________________   Discussed status with Dr. Posey Pronto on 03/22/2017 at  1053 and received telephone approval for admission today.  Admission Coordinator:  Cleatrice Burke, time [id number] Date 03/22/2017             Cosigned by: Jamse Arn, MD at 03/22/2017 11:52 AM  Revision History

## 2017-03-23 ENCOUNTER — Inpatient Hospital Stay (HOSPITAL_COMMUNITY): Payer: BLUE CROSS/BLUE SHIELD | Admitting: Physical Therapy

## 2017-03-23 ENCOUNTER — Inpatient Hospital Stay (HOSPITAL_COMMUNITY): Payer: BLUE CROSS/BLUE SHIELD | Admitting: Occupational Therapy

## 2017-03-23 ENCOUNTER — Inpatient Hospital Stay (HOSPITAL_COMMUNITY): Payer: BLUE CROSS/BLUE SHIELD | Admitting: Speech Pathology

## 2017-03-23 DIAGNOSIS — G4733 Obstructive sleep apnea (adult) (pediatric): Secondary | ICD-10-CM

## 2017-03-23 DIAGNOSIS — I481 Persistent atrial fibrillation: Secondary | ICD-10-CM

## 2017-03-23 DIAGNOSIS — L98429 Non-pressure chronic ulcer of back with unspecified severity: Secondary | ICD-10-CM

## 2017-03-23 DIAGNOSIS — I5021 Acute systolic (congestive) heart failure: Secondary | ICD-10-CM

## 2017-03-23 DIAGNOSIS — R5381 Other malaise: Principal | ICD-10-CM

## 2017-03-23 LAB — GLUCOSE, CAPILLARY
GLUCOSE-CAPILLARY: 107 mg/dL — AB (ref 65–99)
GLUCOSE-CAPILLARY: 119 mg/dL — AB (ref 65–99)
Glucose-Capillary: 126 mg/dL — ABNORMAL HIGH (ref 65–99)
Glucose-Capillary: 121 mg/dL — ABNORMAL HIGH (ref 65–99)

## 2017-03-23 LAB — COMPREHENSIVE METABOLIC PANEL
ALK PHOS: 177 U/L — AB (ref 38–126)
ALT: 38 U/L (ref 17–63)
ANION GAP: 11 (ref 5–15)
AST: 25 U/L (ref 15–41)
Albumin: 2.2 g/dL — ABNORMAL LOW (ref 3.5–5.0)
BILIRUBIN TOTAL: 1.4 mg/dL — AB (ref 0.3–1.2)
BUN: 19 mg/dL (ref 6–20)
CALCIUM: 8.7 mg/dL — AB (ref 8.9–10.3)
CO2: 26 mmol/L (ref 22–32)
Chloride: 95 mmol/L — ABNORMAL LOW (ref 101–111)
Creatinine, Ser: 0.69 mg/dL (ref 0.61–1.24)
GFR calc non Af Amer: >60 mL/min (ref >60)
Glucose, Bld: 115 mg/dL — ABNORMAL HIGH (ref 65–99)
Potassium: 4.2 mmol/L (ref 3.5–5.1)
Sodium: 132 mmol/L — ABNORMAL LOW (ref 135–145)
TOTAL PROTEIN: 7.1 g/dL (ref 6.5–8.1)

## 2017-03-23 LAB — CBC WITH DIFFERENTIAL/PLATELET
Basophils Absolute: 0.1 10*3/uL (ref 0.0–0.1)
Basophils Relative: 1 %
EOS ABS: 0.3 10*3/uL (ref 0.0–0.7)
Eosinophils Relative: 3 %
HEMATOCRIT: 37.8 % — AB (ref 39.0–52.0)
HEMOGLOBIN: 12.2 g/dL — AB (ref 13.0–17.0)
LYMPHS ABS: 1.6 10*3/uL (ref 0.7–4.0)
Lymphocytes Relative: 13 %
MCH: 29.1 pg (ref 26.0–34.0)
MCHC: 32.3 g/dL (ref 30.0–36.0)
MCV: 90.2 fL (ref 78.0–100.0)
MONO ABS: 1.9 10*3/uL — AB (ref 0.1–1.0)
MONOS PCT: 15 %
NEUTROS ABS: 8.7 10*3/uL — AB (ref 1.7–7.7)
NEUTROS PCT: 68 %
Platelets: 272 10*3/uL (ref 150–400)
RBC: 4.19 MIL/uL — ABNORMAL LOW (ref 4.22–5.81)
RDW: 16.1 % — ABNORMAL HIGH (ref 11.5–15.5)
WBC: 12.6 10*3/uL — ABNORMAL HIGH (ref 4.0–10.5)

## 2017-03-23 MED ORDER — HEPARIN SOD (PORK) LOCK FLUSH 100 UNIT/ML IV SOLN
500.0000 [IU] | INTRAVENOUS | Status: DC | PRN
  Filled 2017-03-23: qty 5

## 2017-03-23 MED ORDER — HEPARIN SOD (PORK) LOCK FLUSH 100 UNIT/ML IV SOLN
500.0000 [IU] | INTRAVENOUS | Status: DC
  Administered 2017-03-23: 500 [IU]
  Filled 2017-03-23: qty 5

## 2017-03-23 MED ORDER — FUROSEMIDE 40 MG PO TABS
60.0000 mg | ORAL_TABLET | Freq: Two times a day (BID) | ORAL | Status: DC
  Administered 2017-03-23 – 2017-04-06 (×29): 60 mg via ORAL
  Filled 2017-03-23 (×29): qty 1

## 2017-03-23 NOTE — Evaluation (Signed)
Speech Language Pathology Assessment and Plan  Patient Details  Name: [name] MRN: [id number] Date of Birth: 1955/01/31  SLP Diagnosis: Cognitive Impairments;Dysphagia  Rehab Potential: Good ELOS: 18-22 days    Today's Date: 03/23/2017 SLP Individual Time: 3810-1751 SLP Individual Time Calculation (min): 60 min   Problem List:  Patient Active Problem List   Diagnosis Date Noted  . Atrial fibrillation ([place])   . Debility   . Wound infection   . OSA (obstructive sleep apnea)   . Acute systolic heart failure ([place])   . Morbid obesity ([place])   . Diabetes mellitus, new onset ([place])   . Dysuria   . Acute respiratory distress syndrome (ARDS) (HCC)   . Influenza A with pneumonia   . Acute systolic CHF (congestive heart failure) ([place])   . New onset atrial fibrillation ([place])   . Acute pulmonary edema (HCC)   . Controlled diabetes mellitus type 2 with complications ([place])   . Anxiety   . Current moderate episode of major depressive disorder without prior episode ([place])   . Obesity, Class III, BMI 40-49.9 (morbid obesity) ([place])   . Community acquired pneumonia   . Critical illness myopathy   . Dysphagia   . Persistent atrial fibrillation ([place])   . Acute systolic congestive heart failure ([place])   . Non-Hodgkin's lymphoma ([place])   . Benign essential HTN   . Leukocytosis   . Hypokalemia   . Tracheostomy status ([place])   . Hypoxic   . Pressure injury of skin 02/28/2017  . Acute respiratory failure with hypoxemia ([place])   . Status post tracheostomy ([place])   . Acute respiratory failure ([place])   . Respiratory failure, unspecified with hypoxia ([place]) 02/16/2017  . Follicular lymphoma grade II of intrathoracic lymph nodes ([place]) 05/02/2016  . Portacath in place 05/04/2015  . Gout attack 01/16/2012  . Follicular lymphoma grade II ([place]) 12/16/2010   Past Medical History:  Past Medical History:  Diagnosis Date  . Diabetes mellitus, new onset ([place])   . Dyslipidemia   . Gout attack 01/16/2012   . Hypertension   . nhl dx'd 01/2010  . Venous insufficiency   . Venous stasis ulcers (HCC)    Past Surgical History:  Past Surgical History:  Procedure Laterality Date  . BRONCHOSCOPY    . ESOPHAGOGASTRODUODENOSCOPY ENDOSCOPY     Dr Jearld Fenton  . INGUINAL HERNIA REPAIR     2001  . PORTA CATH INSERTION  2011    Assessment / Plan / Recommendation Clinical Impression Patientis a 62 y.o.malewith history of Non- Hodgkin's lymphoma -in remission, HTN,  PVD with bilateral foot wounds, morbid obesity who was admitted on 02/16/17 with ARDS due influenza A with sepsis. He was found to have cardiomyopathy with EF 25-30% and new onset of acute systolic CHF. He required intubation on 2/9 with volume overload, hypotension as well as tracheostomy on 2/14. Dr. Percival Spanish following for input on HF as well  as new onset of A fib. Eliquis recommended due to CHA2DS2VASc - 4.  He was weaned off vent with high oxygenation was able to tolerate plugging. Hypoxia felt to be due to mix of ALI/ARDS after flu and atelectasis d/t body habitus. He had desaturation on overnight pulse oximetry ( 4-5  episodes lasting < 2 minutes with lowest reading being 83%) and PCCM recommended continuing trach--plugged during the day and open at night but patient hates trach and preferred to have it removed therefore  underwent decannulation 3/13. Will need outpatient sleep study by Dr.  Sood after discharge. WOC following for input on foot wounds as well as MASD on sacrum/buttocks. He continues to have bouts of anxiety secondary to DOE and -needs 4-6 liters per . Activity levels improving and CIR recommended due to debility. Pt admitted to CIR on 03/22/2017.   Pt presents with a mild swallowing deficit. SLP provided skilled observation of pt consuming breakfast. Pt consumed thin liquids without overt s/s of aspiration with Mod I for use of chin tuck. However, pt continues to demonstrate what appeared to be a delayed swallowing initiation  intermittently with thin liquids. Pt also consumed dys. 3 textures and demonstrated use of multiple swallows with Mod I. However, trials of regular textures were not attempted due to pt's difficulties with regular textures on FEES per pt's report. Therefore, recommend pt continue with dys. 3 textures and thin liquids via cup. SLP also administered the [employer] version 7.2. Pt scored 24 out of 30 points with 26 and above considered within normal limits. Pt demonstrates deficits in complex problem solving, recall, and emergent awareness. Patient would benefit from skilled SLP intervention to maximize his cognitive and swallowing function in order to maximize his overall functional independence prior to discharge.    Skilled Therapeutic Interventions          Patient administered a BSE and MOCA version 7.2. Please see above for details. Educated patient in regards to his current swallowing and cognitive deficits and goals of skilled SLP intervention. Pt verbalized understanding and agreement.     SLP Assessment  Patient will need skilled Speech Lanaguage Pathology Services during CIR admission    Recommendations  SLP Diet Recommendations: Dysphagia 3 (Mech soft);Thin Liquid Administration via: Cup;No straw Medication Administration: Whole meds with puree Supervision: Patient able to self feed;Intermittent supervision to cue for compensatory strategies Compensations: Slow rate;Small sips/bites;Multiple dry swallows after each bite/sip;Chin tuck;Clear throat intermittently;Minimize environmental distractions Postural Changes and/or Swallow Maneuvers: Seated upright 90 degrees;Upright 30-60 min after meal;Chin tuck Oral Care Recommendations: Oral care BID Recommendations for Other Services: Neuropsych consult Patient destination: Home Follow up Recommendations: (TBD) Equipment Recommended: None recommended by SLP    SLP Frequency 3 to 5 out of 7 days   SLP Duration  SLP Intensity  SLP  Treatment/Interventions 18-22 days  Minumum of 1-2 x/day, 30 to 90 minutes  Cognitive remediation/compensation;Environmental controls;Multimodal communication approach;Therapeutic Activities;Functional tasks;Cueing hierarchy;Internal/external aids;Dysphagia/aspiration precaution training;Patient/family education    Pain Pain Assessment Pain Assessment: No/denies pain    Function:  Eating Eating   Modified Consistency Diet: Yes Eating Assist Level: No help, No cues;Swallowing techniques: self managed           Cognition Comprehension Comprehension assist level: Follows complex conversation/direction with extra time/assistive device  Expression   Expression assist level: Expresses basic needs/ideas: With extra time/assistive device  Social Interaction Social Interaction assist level: Interacts appropriately with others with medication or extra time (anti-anxiety, antidepressant).  Problem Solving Problem solving assist level: Solves basic 75 - 89% of the time/requires cueing 10 - 24% of the time  Memory Memory assist level: Recognizes or recalls 75 - 89% of the time/requires cueing 10 - 24% of the time   Short Term Goals: Week 1: SLP Short Term Goal 1 (Week 1): Pt will consume current diet without overt s/s of aspiration and demonstrate use of compensatory swallowing strategies with Mod I.  SLP Short Term Goal 2 (Week 1): Pt will consume trials of regular textures with efficient mastication, complete oral clearance, and minimal overt s/s of aspiration with  Supervision verbal cues over two sessions prior to upgrade. SLP Short Term Goal 3 (Week 1): Pt will self monitor and correct errors during functional tasks with Supervision verbal cues.  SLP Short Term Goal 4 (Week 1): Pt will complete complex problem solving tasks with Supervision verbal cues.  SLP Short Term Goal 5 (Week 1): Pt will utilize external memory aides to recall new daily information with Mod I.  Refer to Care Plan  for Long Term Goals  Recommendations for other services: Neuropsych  Discharge Criteria: Patient will be discharged from SLP if patient refuses treatment 3 consecutive times without medical reason, if treatment goals not met, if there is a change in medical status, if patient makes no progress towards goals or if patient is discharged from hospital.  The above assessment, treatment plan, treatment alternatives and goals were discussed and mutually agreed upon: by patient   Meredeth Ide  SLP - Student 03/23/2017, 3:45 PM

## 2017-03-23 NOTE — Progress Notes (Signed)
Social Work  Social Work Assessment and Plan  Patient Details  Name: [name] MRN: [id number] Date of Birth: 07/01/1955  Today's Date: 03/23/2017  Problem List:  Patient Active Problem List   Diagnosis Date Noted  . Atrial fibrillation ([place])   . Debility   . Wound infection   . OSA (obstructive sleep apnea)   . Acute systolic heart failure ([place])   . Morbid obesity ([place])   . Diabetes mellitus, new onset ([place])   . Dysuria   . Acute respiratory distress syndrome (ARDS) (HCC)   . Influenza A with pneumonia   . Acute systolic CHF (congestive heart failure) ([place])   . New onset atrial fibrillation ([place])   . Acute pulmonary edema (HCC)   . Controlled diabetes mellitus type 2 with complications ([place])   . Anxiety   . Current moderate episode of major depressive disorder without prior episode ([place])   . Obesity, Class III, BMI 40-49.9 (morbid obesity) ([place])   . Community acquired pneumonia   . Critical illness myopathy   . Dysphagia   . Persistent atrial fibrillation ([place])   . Acute systolic congestive heart failure ([place])   . Non-Hodgkin's lymphoma ([place])   . Benign essential HTN   . Leukocytosis   . Hypokalemia   . Tracheostomy status ([place])   . Hypoxic   . Pressure injury of skin 02/28/2017  . Acute respiratory failure with hypoxemia ([place])   . Status post tracheostomy ([place])   . Acute respiratory failure ([place])   . Respiratory failure, unspecified with hypoxia ([place]) 02/16/2017  . Follicular lymphoma grade II of intrathoracic lymph nodes ([place]) 05/02/2016  . Portacath in place 05/04/2015  . Gout attack 01/16/2012  . Follicular lymphoma grade II ([place]) 12/16/2010   Past Medical History:  Past Medical History:  Diagnosis Date  . Diabetes mellitus, new onset ([place])   . Dyslipidemia   . Gout attack 01/16/2012  . Hypertension   . nhl dx'd 01/2010  . Venous insufficiency   . Venous stasis ulcers (HCC)    Past Surgical History:  Past Surgical History:  Procedure Laterality Date   . BRONCHOSCOPY    . ESOPHAGOGASTRODUODENOSCOPY ENDOSCOPY     Dr Jearld Fenton  . INGUINAL HERNIA REPAIR     2001  . PORTA CATH INSERTION  2011   Social History:  reports that he quit smoking about 5 years ago. He has a 10.00 pack-year smoking history. he has never used smokeless tobacco. He reports that he does not drink alcohol or use drugs.  Family / Support Systems Marital Status: Single Patient Roles: Other (Comment)(has a sister) Other Supports: sister, Margaretmary Eddy @ ([phone]) [phone];  friend, Mariea Stable @ [phone] Anticipated Caregiver: sister, [name] Art, and friend, Jenny Reichmann Ability/Limitations of Caregiver: no limitations Caregiver Availability: 24/7 Family Dynamics: Pt descrbies very close relationship with sister.  Does have a brother who lives locally but has health issues and only limited contact.    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   Abuse/Neglect Abuse/Neglect Assessment Can Be Completed: Yes Physical Abuse: Denies Verbal Abuse: Denies Sexual Abuse: Denies Exploitation of patient/patient's resources: Denies Self-Neglect: Denies  Emotional Status Pt's affect,  behavior adn adjustment status: Pt lying in bed and agreeable to interview.  Very pleasant and completes assessment without difficulty.  Talks openly about the anxiety he has felt during his hospitalization due to feelings and fears of "not being able to breath..."  Admits he was "really anxious when they took this out (trach)".  He is hopeful that this will begin to decrease as his functioning improves.  Denies any other  emotional concerns.  He is optimistic overall about his recovery. Recent Psychosocial Issues: none Pyschiatric History: None Substance Abuse History: none  Patient / Family Perceptions, Expectations & Goals Pt/Family understanding of illness & functional limitations: Pt has a good understanding of his acute medical decline/ resp failure.  He admits little recall of the initial leg of his hospitalization and states, "... but that's ok with me..."  Sister and friend with good understanding of medical issues and current functional limitations/ need for CIR. 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 Anticipated changes in roles/activities/participation: would anticipate pt may need 24/7 support initially and sister/ friend are prepared to share in providing this. Pt/family expectations/goals: "I just hope I can get my independence back."  [employer]: None Premorbid Home Care/DME Agencies: None Transportation available at discharge: yes Resource referrals recommended: Neuropsychology  Discharge Planning Living Arrangements: Alone Support Systems: Other relatives, Water engineer, Social worker community Type of Residence: Private residence Insurance Resources: Multimedia programmer (specify)(BCBS) Financial Resources: Employment Financial Screen Referred: No Living Expenses: Higher education careers adviser Management: Patient Does the patient have any problems obtaining your medications?: No Home Management: pt was independent with this Patient/Family Preliminary Plans: Pt plans to d/c to his home with sister and friend sharing in providing 24/7 assistance. Social Work Anticipated Follow Up Needs: HH/OP Expected length of stay: ELOS 18 to 22 days  Clinical Impression Very pleasant gentleman here following resp failure due to influenza.  Now with significant debility and still some anxiety about his respiratory status.  Trach out  yesterday and pt is hopeful he will make good progress on CIR.  Sister and friend are very supportive and able to provide 24/7 support at d/c.  SW to follow for support and d/c planning needs.  Dany Harten 03/23/2017, 3:07 PM

## 2017-03-23 NOTE — Progress Notes (Signed)
[facility] PHYSICAL MEDICINE & REHABILITATION     PROGRESS NOTE    Subjective/Complaints: Had a reasonable night. Didn't sleep well as had difficulty tolerating CPAP. Denies pain or breathign issues  ROS: Patient denies fever, rash, sore throat, blurred vision, nausea, vomiting, diarrhea, cough, shortness of breath or chest pain, joint or back pain, headache, or mood change.   Objective: Vital Signs: Blood pressure 90/60, pulse 67, temperature 98.2 F (36.8 C), temperature source Oral, resp. rate 18, height 6\' 1"  (1.854 m), weight (!) 139.2 kg (306 lb 14.1 oz), SpO2 96 %. No results found. Recent Labs    03/22/17 0826 03/23/17 0429  WBC 12.3* 12.6*  HGB 12.1* 12.2*  HCT 37.7* 37.8*  PLT 261 272   Recent Labs    03/22/17 0826 03/23/17 0429  NA 132* 132*  K 4.0 4.2  CL 95* 95*  GLUCOSE 168* 115*  BUN 20 19  CREATININE 0.72 0.69  CALCIUM 8.7* 8.7*   CBG (last 3)  Recent Labs    03/22/17 1704 03/22/17 2049 03/23/17 0628  GLUCAP 97 127* 107*    Wt Readings from Last 3 Encounters:  03/22/17 (!) 139.2 kg (306 lb 14.1 oz)  03/22/17 132.2 kg (291 lb 7.2 oz)  11/07/16 16.5 kg (36 lb 6.4 oz)    Physical Exam:  Constitutional: He is oriented to person, place, and time. He appears well-developed and well-nourished.  obese HENT:  Head: NCAT.  Eyes: EOMI  Neck: Normal range of motion. Neck supple.  Trach stoma still open with air leakage through dressing during valsalva Cardiovascular: Irregularly irregular without JVD or murmur  Respiratory: CTA Bilaterally without wheezes or rales. Normal effort   GI: BS +, non-tender, non-distended  Musculoskeletal: He exhibits no edema or tenderness.  BLE with venous statis changes, dry flaky skin and 1+ pedal edema. RLE wrapped in UNNA. LLE with trace edmea Neurological: He is alert and oriented to person, place, and time. No cranial nerve deficit.  Motor: UE: 4/5 prox to distal. senses pain and light touch in all 4's Skin:  Skin is warm and dry.  Sacral wound about 1" with mild fibronrecrotic tissue at center, shallow Psychiatric: pleasant and appropriate     Assessment/Plan: 1. Functional and mobility deficits secondary to debility which require 3+ hours per day of interdisciplinary therapy in a comprehensive inpatient rehab setting. Physiatrist is providing close team supervision and 24 hour management of active medical problems listed below. Physiatrist and rehab team continue to assess barriers to discharge/monitor patient progress toward functional and medical goals.  Function:  Bathing Bathing position      Bathing parts      Bathing assist        Upper Body Dressing/Undressing Upper body dressing                    Upper body assist        Lower Body Dressing/Undressing Lower body dressing                                  Lower body assist        Toileting Toileting          Toileting assist     Transfers Chair/bed Clinical biochemist  Cognition Comprehension Comprehension assist level: Follows complex conversation/direction with no assist  Expression Expression assist level: Expresses complex ideas: With no assist  Social Interaction Social Interaction assist level: Interacts appropriately with others - No medications needed.  Problem Solving Problem solving assist level: Solves basic problems with no assist  Memory Memory assist level: More than reasonable amount of time   Medical Problem List and Plan: 1.  Generalized weakness, limitation in self-care secondary to debility.   -beginning therapies today 2.  New onset A fib/DVT Prophylaxis/Anticoagulation: Pharmaceutical: Other (comment)--Eliquis 3. Pain Management: tylenol prn 4. Anxiety disorder/Mood: Continue Zoloft with ativan prn. Team to provide ego support. LCSW to follow for evaluation and support. Appears in good spirits  today 5. Neuropsych: This patient is capable of making decisions on his own behalf. 6. Skin/Wound Care: Santyl with wet to dry dressing to areas of breakdown on sacrum.    - Encourage pressure relief measures.    -Maintain adequate nutritional and hydration status. Wound should heal nicely 7. Fluids/Electrolytes/Nutrition: Monitor I/O. I personally reviewed the patient's labs today.      -encourage appropriate po intake. 8. OSA?Acute hypoxic RF: Continue albuterol nbes tid. Oxygen with activity and CPAP at nights. Sleep study post discharge.    -will need some acclimation to CPAP pressure/duration 9. Acute systolic CHF: Question viral etiology. Monitor weights daily.    -Continue Coreg bid, Lasix bid, Cozaar daily, Lipitor and baby ASA.    Danley Danker Weights   03/22/17 1540  Weight: (!) 139.2 kg (306 lb 14.1 oz)    10. Morbid Obesity: Encourage weight loss to promote health, respiratory status and mobility 11. Hypokalemia: Managed with supplement. wnl 12. New diagnosis T2DM: HGB A1C- 6.3. Monitor BS ac/hs with SSI for elevated BS.    -reasonable control at present  13. Follicular lymphoma: In remission.  14. Chronic BLE edema with venous stasis ulcer: UNNA boot in place LLE since 3/13   - Resume TEDs.   - Protein/vitamin supplement.  15.  Dysuria with hesitancy: UA negative, ucx pending   - urine does have odor   -PVR pending    LOS (Days) 1 A FACE TO Quincy T, MD 03/23/2017 7:53 AM

## 2017-03-23 NOTE — Progress Notes (Signed)
Patient information reviewed and entered into eRehab system by Aideliz Garmany, RN, CRRN, PPS Coordinator.  Information including medical coding and functional independence measure will be reviewed and updated through discharge.    

## 2017-03-23 NOTE — Evaluation (Addendum)
Occupational Therapy Assessment and Plan  Patient Details  Name: [name] MRN: [id number] Date of Birth: [birth year]  OT Diagnosis: lumbago (low back pain) and muscle weakness (generalized) Rehab Potential: Rehab Potential (ACUTE ONLY): Excellent ELOS: 14-18 days   Today's Date: 03/23/2017 OT Individual Time: 1000-1100 OT Individual Time Calculation (min): 60 min     Problem List:  Patient Active Problem List   Diagnosis Date Noted  . Atrial fibrillation ([place])   . Debility   . Wound infection   . OSA (obstructive sleep apnea)   . Acute systolic heart failure ([place])   . Morbid obesity ([place])   . Diabetes mellitus, new onset ([place])   . Dysuria   . Acute respiratory distress syndrome (ARDS) (HCC)   . Influenza A with pneumonia   . Acute systolic CHF (congestive heart failure) ([place])   . New onset atrial fibrillation ([place])   . Acute pulmonary edema (HCC)   . Controlled diabetes mellitus type 2 with complications ([place])   . Anxiety   . Current moderate episode of major depressive disorder without prior episode ([place])   . Obesity, Class III, BMI 40-49.9 (morbid obesity) ([place])   . Community acquired pneumonia   . Critical illness myopathy   . Dysphagia   . Persistent atrial fibrillation ([place])   . Acute systolic congestive heart failure ([place])   . Non-Hodgkin's lymphoma ([place])   . Benign essential HTN   . Leukocytosis   . Hypokalemia   . Tracheostomy status ([place])   . Hypoxic   . Pressure injury of skin 02/28/2017  . Acute respiratory failure with hypoxemia ([place])   . Status post tracheostomy ([place])   . Acute respiratory failure ([place])   . Respiratory failure, unspecified with hypoxia ([place]) 02/16/2017  . Follicular lymphoma grade II of intrathoracic lymph nodes ([place]) 05/02/2016  . Portacath in place 05/04/2015  . Gout attack 01/16/2012  . Follicular lymphoma grade II ([place]) 12/16/2010    Past Medical History:  Past Medical History:  Diagnosis Date  . Diabetes mellitus, new  onset ([place])   . Dyslipidemia   . Gout attack 01/16/2012  . Hypertension   . nhl dx'd 01/2010  . Venous insufficiency   . Venous stasis ulcers (H[facility])    Past Surgical History:  Past Surgical History:  Procedure Laterality Date  . BRONCHOSCOPY    . ESOPHAGOGASTRODUODENOSCOPY ENDOSCOPY     Dr Jearld Fenton  . INGUINAL HERNIA REPAIR     2001  . PORTA CATH INSERTION  2011    Assessment & Plan Clinical Impression: Patient is a 61 y.o.malewith history of Non- Hodgkin's lymphoma -in remission, HTN,  PVD with bilateral foot wounds, morbid obesity who was admitted on 02/16/17 with ARDS due influenza A with sepsis. He was found to have cardiomyopathy with EF 25-30% and new onset of acute systolic CHF. He required intubation on 2/9 with volume overload, hypotension as well as tracheostomy on 2/14. Dr. Percival Spanish following for input on HF as well  as new onset of A fib. Eliquis recommended due to CHA2DS2VASc - 4.  He was weaned off vent with high oxygenation was able to tolerate plugging.   Hypoxia felt to be due to mix of ALI/ARDS after flu and atelectasis d/t body habitus. Patient transferred to CIR on 03/22/2017 .    Patient currently requires min with basic self-care skills and basic mobility secondary to muscle weakness, decreased oxygen support and low back pain and decreased standing balance.  Prior to hospitalization, patient could complete  ADLs with independent .  Patient will benefit from skilled intervention to decrease level of assist with basic self-care skills and increase independence with basic self-care skills prior to discharge home independently.  Anticipate patient will require intermittent supervision and assistance with IADLs and follow up home health.  OT - End of Session Activity Tolerance: Tolerates 10 - 20 min activity with multiple rests Endurance Deficit: Yes Endurance Deficit Description: Pt recently decannulated and on 4LNC, pt reports dizziness with exertion. OT Assessment Rehab  Potential (ACUTE ONLY): Excellent OT Patient demonstrates impairments in the following area(s): Balance;Endurance;Motor;Pain;Skin Integrity;Sensory;Safety OT Basic ADL's Functional Problem(s): Dressing;Bathing;Other (comment);Toileting(functional activity tolerance) OT Transfers Functional Problem(s): Toilet;Tub/Shower OT Plan OT Intensity: Minimum of 1-2 x/day, 45 to 90 minutes OT Frequency: 5 out of 7 days OT Duration/Estimated Length of Stay: 14-18 days OT Treatment/Interventions: Balance/vestibular training;Discharge planning;Pain management;Self Care/advanced ADL retraining;Therapeutic Activities;UE/LE Coordination activities;Disease mangement/prevention;Functional mobility training;Patient/family education;Skin care/wound managment;Therapeutic Exercise;Community reintegration;DME/adaptive equipment instruction;UE/LE Strength taining/ROM OT Self Feeding Anticipated Outcome(s): mod I OT Basic Self-Care Anticipated Outcome(s): (S)-mod I OT Toileting Anticipated Outcome(s): (S)- mod I OT Bathroom Transfers Anticipated Outcome(s): (S)- mod I OT Recommendation OT equipment: to be determined Patient destination: Home Follow Up Recommendations: Home health OT   Skilled Therapeutic Intervention Pt received in room supine in bed. Pt agreeable to OT session and provided with education re OT POC and role of OT in CIR. Vc and manual cues provided during supine to sit transfer, with HOB elevated and manual facilitation provided at L LE. Manual cues for initiation and vc for RW management/UE placement provided during sit to stand transfer. Pt completed 10ft of functional mobility to bathroom with min A posteriorly and vc for obstacle navigation. Education provided re importance of (S) during toileting tasks d/t pt I transferring off toilet. Frequent vc for rest breaks and breathing cues d/t pt quickly fatiguing during ambulation and LB ADL tasks. Education provided re ECT during ADL routine. Vc provided  for use of figure 4 technique when donning socks seated EOB. Education provided re weight shifting and pressure relief technique d/t wound on bottom. Pt c/o low back pain during transfer back to supine in bed. Demonstration provided re log rolling technique and discussion re pain management techniques and positioning. Extensive discussion with pt re OT POC, goal planning, and d/c planning.    OT Evaluation Precautions/Restrictions  Precautions Precautions: Fall Precaution Comments: Watch O2 sats, Unna boot R LE Restrictions Weight Bearing Restrictions: No General Chart Reviewed: Yes Family/Caregiver Present: Yes Vital Signs Oxygen Therapy O2 Device: Nasal Cannula O2 Flow Rate (L/min): 4 L/min Pain Pain Assessment Pain Assessment: Faces Pain Score: 0-No pain Faces Pain Scale: Hurts whole lot Pain Type: Chronic pain Pain Location: Back Pain Orientation: Right Pain Descriptors / Indicators: Shooting;Sharp Pain Onset: With Activity Home Living/Prior Functioning Home Living Available Help at Discharge: Family, Friend(s), Available PRN/intermittently Type of Home: Other(Comment) Home Access: Stairs to enter Entrance Stairs-Number of Steps: 1 Entrance Stairs-Rails: None Home Layout: Two level, 1/2 bath on main level, Bed/bath upstairs Alternate Level Stairs-Number of Steps: 6 steps then a landing and then 6 more steps Alternate Level Stairs-Rails: Right Bathroom Shower/Tub: Tub/shower unit Bathroom Toilet: Standard Bathroom Accessibility: Yes  Lives With: Alone ADL ADL ADL Comments: see functional navigator Vision Baseline Vision/History: No visual deficits Perception  Perception: Within Functional Limits Praxis Praxis: Intact Cognition Overall Cognitive Status: Within Functional Limits for tasks assessed Arousal/Alertness: Awake/alert Orientation Level: Person;Place;Situation Person: Oriented Place: Oriented Situation: Oriented Year: 2019 Month: March Day of   Week:  Correct Memory: Appears intact Immediate Memory Recall: Sock;Blue;Bed Safety/Judgment: Impaired Sensation Sensation Light Touch: Impaired by gross assessment(B LE, pt reports numbness) Motor  Motor Motor - Skilled Clinical Observations: Overall muscle weakness Mobility  Bed Mobility Bed Mobility: Rolling Right;Rolling Left;Supine to Sit Rolling Right: 3: Mod assist Rolling Right Details: Verbal cues for technique;Verbal cues for sequencing Rolling Left: 3: Mod assist Rolling Left Details: Verbal cues for sequencing;Verbal cues for technique Supine to Sit: 4: Min assist Supine to Sit Details: Verbal cues for technique;Verbal cues for sequencing;Manual facilitation for placement;Verbal cues for precautions/safety Transfers Transfers: Sit to Stand;Stand to Sit Sit to Stand: 3: Mod assist Sit to Stand Details: Verbal cues for technique;Verbal cues for sequencing;Manual facilitation for placement;Verbal cues for precautions/safety;Verbal cues for safe use of DME/AE Stand to Sit: 4: Min guard  Trunk/Postural Assessment  Cervical Assessment Cervical Assessment: Within Functional Limits Thoracic Assessment Thoracic Assessment: Within Functional Limits Lumbar Assessment Lumbar Assessment: Within Functional Limits Postural Control Postural Control: Deficits on evaluation Righting Reactions: Pt required manual cues to correct balance during functional mobility  Balance Dynamic Sitting Balance Sitting balance - Comments: Pt able to maintain dynamic sitting balance, reaching outside of BOS Extremity/Trunk Assessment RUE Assessment RUE Assessment: Within Functional Limits LUE Assessment LUE Assessment: Exceptions to WFL LUE AROM (degrees) LUE Overall AROM Comments: Pt able to flex L shoulder to 90 degrees actively. Deficit does not affect bimanual dressing or bathing   See Function Navigator for Current Functional Status.   Refer to Care Plan for Long Term Goals  Recommendations  for other services: None    Discharge Criteria: Patient will be discharged from OT if patient refuses treatment 3 consecutive times without medical reason, if treatment goals not met, if there is a change in medical status, if patient makes no progress towards goals or if patient is discharged from hospital.  The above assessment, treatment plan, treatment alternatives and goals were discussed and mutually agreed upon: by patient  Sandra H Davis 03/23/2017, 12:16 PM     

## 2017-03-23 NOTE — Plan of Care (Signed)
  Progressing Consults RH GENERAL PATIENT EDUCATION Description See Patient Education module for education specifics. 03/23/2017 1359 - Progressing by Claude Manges, LPN Skin Care Protocol Initiated - if Braden Score 18 or less Description If consults are not indicated, leave blank or document N/A 03/23/2017 1359 - Progressing by Claude Manges, LPN Nutrition Consult-if indicated 03/23/2017 6599 - Progressing by Claude Manges, LPN Diabetes Guidelines if Diabetic/Glucose > 140 Description If diabetic or lab glucose is > 140 mg/dl - Initiate Diabetes/Hyperglycemia Guidelines & Document Interventions  03/23/2017 1359 - Progressing by Claude Manges, LPN RH BOWEL ELIMINATION RH STG MANAGE BOWEL WITH ASSISTANCE Description STG Manage Bowel with Assistance. 03/23/2017 1359 - Progressing by Claude Manges, LPN RH STG MANAGE BOWEL W/MEDICATION W/ASSISTANCE Description STG Manage Bowel with Medication with Assistance. 03/23/2017 1359 - Progressing by Claude Manges, LPN RH STG MANAGE BOWEL W/EQUIPMENT W/ASSISTANCE Description STG Manage Bowel With Equipment With Assistance 03/23/2017 1359 - Progressing by Claude Manges, LPN RH BLADDER ELIMINATION RH STG MANAGE BLADDER WITH ASSISTANCE Description STG Manage Bladder With Assistance 03/23/2017 1359 - Progressing by Claude Manges, LPN RH STG MANAGE BLADDER WITH MEDICATION WITH ASSISTANCE Description STG Manage Bladder With Medication With Assistance. 03/23/2017 1359 - Progressing by Claude Manges, LPN [facility] SKIN INTEGRITY RH STG SKIN FREE OF INFECTION/BREAKDOWN 03/23/2017 1359 - Progressing by Claude Manges, LPN RH STG MAINTAIN SKIN INTEGRITY WITH ASSISTANCE Description STG Maintain Skin Integrity With Assistance. 03/23/2017 1359 - Progressing by Claude Manges, LPN RH STG ABLE TO PERFORM INCISION/WOUND CARE W/ASSISTANCE Description STG Able To Perform Incision/Wound Care With  Assistance. 03/23/2017 1359 - Progressing by Claude Manges, LPN RH SAFETY RH STG ADHERE TO SAFETY PRECAUTIONS W/ASSISTANCE/DEVICE Description STG Adhere to Safety Precautions With Assistance/Device. 03/23/2017 1359 - Progressing by Claude Manges, LPN RH STG DECREASED RISK OF FALL WITH ASSISTANCE Description STG Decreased Risk of Fall With Assistance. 03/23/2017 1359 - Progressing by Claude Manges, LPN RH STG DEMO UNDERSTANDING HOME SAFETY PRECAUTIONS 03/23/2017 1359 - Progressing by Claude Manges, LPN RH PAIN MANAGEMENT RH STG PAIN MANAGED AT OR BELOW PT'S PAIN GOAL 03/23/2017 1359 - Progressing by Claude Manges, LPN RH KNOWLEDGE DEFICIT GENERAL RH STG INCREASE KNOWLEDGE OF SELF CARE AFTER HOSPITALIZATION 03/23/2017 1359 - Progressing by Claude Manges, LPN

## 2017-03-23 NOTE — Evaluation (Addendum)
Physical Therapy Assessment and Plan  Patient Details  Name: [name] MRN: [id number] Date of Birth: 03/15/1955  PT Diagnosis: Abnormality of gait, Difficulty walking, Impaired cognition and Muscle weakness Rehab Potential: Good ELOS: 2-2.5 weeks   Today's Date: 03/23/2017 PT Individual Time: 0109-3235 PT Individual Time Calculation (min): 73 min    Problem List:  Patient Active Problem List   Diagnosis Date Noted  . Atrial fibrillation ([place])   . Debility   . Wound infection   . OSA (obstructive sleep apnea)   . Acute systolic heart failure ([place])   . Morbid obesity ([place])   . Diabetes mellitus, new onset ([place])   . Dysuria   . Acute respiratory distress syndrome (ARDS) (HCC)   . Influenza A with pneumonia   . Acute systolic CHF (congestive heart failure) ([place])   . New onset atrial fibrillation ([place])   . Acute pulmonary edema (H[facility])   . Controlled diabetes mellitus type 2 with complications ([place])   . Anxiety   . Current moderate episode of major depressive disorder without prior episode ([place])   . Obesity, Class III, BMI 40-49.9 (morbid obesity) ([place])   . Community acquired pneumonia   . Critical illness myopathy   . Dysphagia   . Persistent atrial fibrillation ([place])   . Acute systolic congestive heart failure ([place])   . Non-Hodgkin's lymphoma ([place])   . Benign essential HTN   . Leukocytosis   . Hypokalemia   . Tracheostomy status ([place])   . Hypoxic   . Pressure injury of skin 02/28/2017  . Acute respiratory failure with hypoxemia ([place])   . Status post tracheostomy ([place])   . Acute respiratory failure ([place])   . Respiratory failure, unspecified with hypoxia ([place]) 02/16/2017  . Follicular lymphoma grade II of intrathoracic lymph nodes ([place]) 05/02/2016  . Portacath in place 05/04/2015  . Gout attack 01/16/2012  . Follicular lymphoma grade II ([place]) 12/16/2010    Past Medical History:  Past Medical History:  Diagnosis Date  . Diabetes mellitus, new onset ([name])   .  Dyslipidemia   . Gout attack 01/16/2012  . Hypertension   . nhl dx'd 01/2010  . Venous insufficiency   . Venous stasis ulcers (H[facility])    Past Surgical History:  Past Surgical History:  Procedure Laterality Date  . BRONCHOSCOPY    . ESOPHAGOGASTRODUODENOSCOPY ENDOSCOPY     Dr Jearld Fenton  . INGUINAL HERNIA REPAIR     2001  . PORTA CATH INSERTION  2011    Assessment & Plan Clinical Impression: Patient is a 62 y.o. year old male with history of Non- Hodgkin's lymphoma -in remission, HTN,  PVD with bilateral foot wounds, morbid obesity who was admitted on 02/16/17 with ARDS due influenza A with sepsis. He was found to have cardiomyopathy with EF 25-30% and new onset of acute systolic CHF. He required intubation on 2/9 with volume overload, hypotension as well as tracheostomy on 2/14. Dr. Percival Spanish following for input on HF as well  as new onset of A fib. Eliquis recommended due to CHA2DS2VASc - 4.  He was weaned off vent with high oxygenation was able to tolerate plugging.   Hypoxia felt to be due to mix of ALI/ARDS after flu and atelectasis d/t body habitus.   He had desaturation on overnight pulse oximetry ( 4-5  episodes lasting < 2 minutes with lowest reading being 83%) and PCCM recommended continuing trach--plugged during the day and open at night but patient hates trach and preferred to have  it removed therefore  underwent decannulation today. Overnight pulse oximetery done last night and will  likely need CPAP at nights. Will need outpatient sleep study by Dr. Halford Chessman after discharge. WOC following for input on foot wounds as well as MASD on sacrum/buttocks. He continues to have bouts of anxiety secondary to DOE and -needs 4-6 liters per [place]. Activity levels improving and CIR recommended due to debility. Patient transferred to CIR on 03/22/2017 .   Patient currently requires assistance with mobility secondary to muscle weakness, decreased cardiorespiratoy endurance and decreased oxygen support, decreased  memory, and decreased standing balance, decreased postural control and decreased balance strategies.  Prior to hospitalization, patient was independent  with mobility and lived with Alone in a House home.  Home access is 1Stairs to enter.  Patient will benefit from skilled PT intervention to maximize safe functional mobility, minimize fall risk and decrease caregiver burden for planned discharge home with intermittent assist.  Anticipate patient will HHPT vs OPPT at discharge.  PT - End of Session Activity Tolerance: Decreased this session Endurance Deficit: Yes Endurance Deficit Description: 2/2 weakness PT Assessment Rehab Potential (ACUTE/IP ONLY): Good PT Patient demonstrates impairments in the following area(s): Balance;Safety;Behavior;Sensory;Edema;Skin Integrity;Endurance;Motor;Pain PT Transfers Functional Problem(s): Bed Mobility;Bed to Chair;Car;Furniture PT Locomotion Functional Problem(s): Ambulation;Wheelchair Mobility;Stairs PT Plan PT Intensity: Minimum of 1-2 x/day ,45 to 90 minutes PT Frequency: 5 out of 7 days PT Duration Estimated Length of Stay: 2-2.5 weeks PT Treatment/Interventions: Ambulation/gait training;Community reintegration;DME/adaptive equipment instruction;Neuromuscular re-education;Psychosocial support;Stair training;UE/LE Strength taining/ROM;Wheelchair propulsion/positioning;UE/LE Coordination activities;Therapeutic Activities;Skin care/wound management;Pain management;Discharge planning;Balance/vestibular training;Cognitive remediation/compensation;Disease management/prevention;Functional mobility training;Patient/family education;Splinting/orthotics;Therapeutic Exercise;Visual/perceptual remediation/compensation PT Transfers Anticipated Outcome(s): supervision with LRAD PT Locomotion Anticipated Outcome(s): supervision with LRAD PT Recommendation Recommendations for Other Services: Neuropsych consult;Therapeutic Recreation consult Therapeutic Recreation  Interventions: Pet therapy;Kitchen group;Outing/community reintergration;Stress management Follow Up Recommendations: (HHPT vs OPPT) Patient destination: Home Equipment Recommended: To be determined  Skilled Therapeutic Intervention Patient received in bed with friend Jenny Reichmann) present for session. Pt c/o sciatic pain in BLE with movement & HA - RN made aware of pt's c/o HA. Therapist thoroughly educated pt & friend on weekly interdisciplinary team meeting, ELOS, purpose and progression of therapy, and other various CIR information. Provided pt with 24x18 w/c with cushion for increased comfort & OOB tolerance. Pt on 4L/min supplemental oxygen & SpO2 = 97%. Nursing student present upon PT arrival & reported pt's BP WNL. Pt reports he does become dizzy with positional changes & BP assessed via dinamap LUE: 86/63 mmHg, HR = 68 bpm & RN made aware. RN attempting to check BP manually but no appropriate sized cuff on unit therefore RN recommends bed level exercises at this time. Pt performs BLE heel slides, hip adduction pillow squeezes, hip abduction, ankle dorsiflexion/plantarflexion with instructional cuing for technique. Pt reports 7/10 fatigue after exercises. At end of session pt left in bed with alarm set, needs within reach & friend present.   Addendum: Pt reports he has 6 steps + 6 steps to access bed/bath on 2nd level but rails are removed - educated pt on need to have someone install them prior to d/c & pt voiced understanding.   PT Evaluation Precautions/Restrictions Precautions Precautions: Fall Precaution Comments: supplemental oxygen Restrictions Weight Bearing Restrictions: No  General Chart Reviewed: Yes Additional Pertinent History: PVD, B foot wounds, HTN, non hodgkins lymphoma, dyslipidemia Response to Previous Treatment: Patient with no complaints from previous session. Family/Caregiver Present: Mont Dutton)  Home Living/Prior Functioning Home Living Living Arrangements:  Alone Available Help at Discharge: Family;Friend(s);Available  PRN/intermittently Type of Home: House Home Access: Stairs to enter [employer] of Steps: 1 Entrance Stairs-Rails: None Home Layout: Two level;Bed/bath upstairs Alternate Level Stairs-Number of Steps: 6 steps + 6 steps without rails but pt has them and can have someone install them  Lives With: Alone Prior Function Level of Independence: Independent with basic ADLs;Independent with transfers;Independent with homemaking with ambulation;Independent with gait(utilized can during gout flare ups)  Able to Take Stairs?: Yes Driving: Yes Vocation: Full time employment Vocation Requirements: owner/employee at [employer] Leisure: Hobbies-yes (Comment) Comments: cooking, hanging out with friends/family  Cognition Overall Cognitive Status: Impaired/Different from baseline Arousal/Alertness: Awake/alert Orientation Level: Oriented X4 Memory Impairment: Decreased recall of new information;Decreased short term Sales executive - Skilled Clinical Observations: poor endurance, generalized weakness   Mobility Bed Mobility Bed Mobility: Scooting to HOB Scooting to HOB: 5: Supervision;With rail Scooting to [facility] Details: Verbal cues for technique;Verbal cues for sequencing Transfers Transfers: No  Locomotion  Ambulation Ambulation: No Gait Gait: No Stairs / Additional Locomotion Stairs: No Wheelchair Mobility Wheelchair Mobility: No    See Function Navigator for Current Functional Status.   Refer to Care Plan for Long Term Goals  Recommendations for other services: Neuropsych and Therapeutic Recreation  Pet therapy, Kitchen group, Stress management and Outing/community reintegration  Discharge Criteria: Patient will be discharged from PT if patient refuses treatment 3 consecutive times without medical reason, if treatment goals not met, if there is a change in medical status, if patient makes no  progress towards goals or if patient is discharged from hospital.  The above assessment, treatment plan, treatment alternatives and goals were discussed and mutually agreed upon: by patient  Waunita Schooner 03/23/2017, 5:04 PM

## 2017-03-23 NOTE — Progress Notes (Signed)
During first rounds patient stated that he wants to try the full face mask. He Doesn't like the nasal mask from previous night using it . Tried the full face mask, patient immediately felt claustrophobic and didn't want the mask. Pt Refuse CPAP for the night. Pt is stable at this time no distress or complications noted.

## 2017-03-24 ENCOUNTER — Inpatient Hospital Stay (HOSPITAL_COMMUNITY): Payer: BLUE CROSS/BLUE SHIELD | Admitting: Occupational Therapy

## 2017-03-24 ENCOUNTER — Inpatient Hospital Stay (HOSPITAL_COMMUNITY): Payer: BLUE CROSS/BLUE SHIELD | Admitting: Physical Therapy

## 2017-03-24 ENCOUNTER — Inpatient Hospital Stay (HOSPITAL_COMMUNITY): Payer: BLUE CROSS/BLUE SHIELD | Admitting: Speech Pathology

## 2017-03-24 DIAGNOSIS — N390 Urinary tract infection, site not specified: Secondary | ICD-10-CM

## 2017-03-24 DIAGNOSIS — M7061 Trochanteric bursitis, right hip: Secondary | ICD-10-CM

## 2017-03-24 DIAGNOSIS — A499 Bacterial infection, unspecified: Secondary | ICD-10-CM

## 2017-03-24 LAB — GLUCOSE, CAPILLARY
GLUCOSE-CAPILLARY: 112 mg/dL — AB (ref 65–99)
GLUCOSE-CAPILLARY: 127 mg/dL — AB (ref 65–99)
Glucose-Capillary: 107 mg/dL — ABNORMAL HIGH (ref 65–99)
Glucose-Capillary: 126 mg/dL — ABNORMAL HIGH (ref 65–99)

## 2017-03-24 MED ORDER — SULFAMETHOXAZOLE-TRIMETHOPRIM 800-160 MG PO TABS
1.0000 | ORAL_TABLET | Freq: Two times a day (BID) | ORAL | Status: AC
  Administered 2017-03-24 – 2017-03-30 (×14): 1 via ORAL
  Filled 2017-03-24 (×14): qty 1

## 2017-03-24 NOTE — Progress Notes (Signed)
RT in patient's room taking to him about wearing CPAP.  Pt stated he does not want to wear CPAP tonight.  He is willing to try and wear it some tomorrow during the day to get used to the machine.

## 2017-03-24 NOTE — Care Management (Signed)
Inpatient [facility] Individual Statement of Services  Patient Name:  [name]  Date:  03/24/2017  Welcome to the [facility].  Our goal is to provide you with an individualized program based on your diagnosis and situation, designed to meet your specific needs.  With this comprehensive rehabilitation program, you will be expected to participate in at least 3 hours of rehabilitation therapies Monday-Friday, with modified therapy programming on the weekends.  Your rehabilitation program will include the following services:  Physical Therapy (PT), Occupational Therapy (OT), Speech Therapy (ST), 24 hour per day rehabilitation nursing, Therapeutic Recreaction (TR), Neuropsychology, Case Management (Social Worker), Rehabilitation Medicine, Nutrition Services and Pharmacy Services  Weekly team conferences will be held on Tuesdays to discuss your progress.  Your Social Worker will talk with you frequently to get your input and to update you on team discussions.  Team conferences with you and your family in attendance may also be held.  Expected length of stay: 14-18 days    Overall anticipated outcome: supervision  Depending on your progress and recovery, your program may change. Your Social Worker will coordinate services and will keep you informed of any changes. Your Social Worker's name and contact numbers are listed  below.  The following services may also be recommended but are not provided by the [facility] will be made to provide these services after discharge if needed.  Arrangements include referral to agencies that provide these services.  Your insurance has been verified to be:  [place] Your primary doctor is:  Brigitte Pulse  Pertinent information will be shared with your doctor and your insurance  company.  Social Worker:  [place], [place] or [phone]   Information discussed with and copy given to patient by: Lennart Pall, 03/24/2017, 2:34 PM

## 2017-03-24 NOTE — Progress Notes (Signed)
Occupational Therapy Session Note  Patient Details  Name: [name] MRN: [id number] Date of Birth: 1955-05-31  Today's Date: 03/24/2017 OT Individual Time: 0315-9458 OT Individual Time Calculation (min): 60 min    Short Term Goals: Week 1:  OT Short Term Goal 1 (Week 1): Pt will complete all toileting tasks with CGA OT Short Term Goal 2 (Week 1): Pt will don socks with CGA and no more than 2 rest breaks OT Short Term Goal 3 (Week 1): Pt will stand at sink during oral care for 2 minutes with no rest break OT Short Term Goal 4 (Week 1): Pt will navigate around room with RW safely to obtain clothing  Skilled Therapeutic Interventions/Progress Updates:    Pt seen for ADL training and discussed his goals of weaning off oxygen.  Pt needed assist to sit to EOB as he is now on an air mattress.  He was having difficulty finding a comfortable sitting position on bed so transferred to w/c for self care training.  Pt's O2 reduced from 4L to 3L.  He was able to maintain sats above 90 throughout the session even with standing activities.  Pt needed to toilet so used his RW with steady A to ambulate in and out of bathroom and completed toileting with S.  Pt returned to his w/c to dress. He was able to don pants over feet and stood to pull them but needed A with his socks.  Pt tolerated therapy session well with several breaks to allow him to "catch his breath".  Pt set up in room with all needs met.    Therapy Documentation Precautions:  Precautions Precautions: Fall Precaution Comments: supplemental oxygen Restrictions Weight Bearing Restrictions: No    Vital Signs: Therapy Vitals Pulse Rate: 68 BP: 102/60 Oxygen Therapy SpO2: 97 % O2 Device: Nasal Cannula O2 Flow Rate (L/min): 3 L/min Pulse Oximetry Type: Intermittent Pain: Pain Assessment Pain Assessment: No/denies pain  ADL: ADL ADL Comments: see functional navigator    See Function Navigator for Current Functional  Status.   Therapy/Group: Individual Therapy  [place] 03/24/2017, 12:26 PM

## 2017-03-24 NOTE — Progress Notes (Signed)
[facility] PHYSICAL MEDICINE & REHABILITATION     PROGRESS NOTE    Subjective/Complaints: Slept fairly well last night. Feels that "sciatica" is acting up in right leg. Didn't use cpap last night. bp dropped in therapy yesterday, he was symptomatic  ROS: Patient denies fever, rash, sore throat, blurred vision, nausea, vomiting, diarrhea, cough, shortness of breath or chest pain, joint or back pain, headache, or mood change.   Objective: Vital Signs: Blood pressure 102/60, pulse 68, temperature 98.6 F (37 C), temperature source Oral, resp. rate 18, height 6\' 1"  (1.854 m), weight (!) 143 kg (315 lb 4.8 oz), SpO2 96 %. No results found. Recent Labs    03/22/17 0826 03/23/17 0429  WBC 12.3* 12.6*  HGB 12.1* 12.2*  HCT 37.7* 37.8*  PLT 261 272   Recent Labs    03/22/17 0826 03/23/17 0429  NA 132* 132*  K 4.0 4.2  CL 95* 95*  GLUCOSE 168* 115*  BUN 20 19  CREATININE 0.72 0.69  CALCIUM 8.7* 8.7*   CBG (last 3)  Recent Labs    03/23/17 1634 03/23/17 2138 03/24/17 0656  GLUCAP 119* 121* 112*    Wt Readings from Last 3 Encounters:  03/24/17 (!) 143 kg (315 lb 4.8 oz)  03/22/17 132.2 kg (291 lb 7.2 oz)  11/07/16 16.5 kg (36 lb 6.4 oz)    Physical Exam:  Constitutional: No distress . Vital signs reviewed. obese HEENT: EOMI, oral membranes moist. Trach stoma dressed. No leak today Cardiovascular: RRR without murmur. No JVD    Respiratory: CTA Bilaterally without wheezes or rales. Normal effort    GI: BS +, non-tender, non-distended   Musculoskeletal: He exhibits no edema or tenderness.  BLE with venous statis changes  and 1+ pedal edema. RLE remains wrapped in UNNA. LLE with trace edmea Neurological: He is alert and oriented to person, place, and time. No cranial nerve deficit.  Motor: UE: 4/5 prox to distal. senses pain and light touch in all 4's--stable Musc: pain along right greater troch. Pain with cross legged maneuver Skin: Skin is warm and dry.  Sacral wound  about 1" with mild fibronrecrotic tissue at center, shallow Psychiatric: pleasant and appropriate     Assessment/Plan: 1. Functional and mobility deficits secondary to debility which require 3+ hours per day of interdisciplinary therapy in a comprehensive inpatient rehab setting. Physiatrist is providing close team supervision and 24 hour management of active medical problems listed below. Physiatrist and rehab team continue to assess barriers to discharge/monitor patient progress toward functional and medical goals.  Function:  Bathing Bathing position   Position: Sitting EOB  Bathing parts Body parts bathed by patient: Right arm, Left arm, Chest, Abdomen, Front perineal area, Right upper leg, Left upper leg Body parts bathed by helper: Right lower leg, Left lower leg, Back  Bathing assist Assist Level: Touching or steadying assistance(Pt > 75%)      Upper Body Dressing/Undressing Upper body dressing   What is the patient wearing?: Pull over shirt/dress     Pull over shirt/dress - Perfomed by patient: Thread/unthread right sleeve, Thread/unthread left sleeve, Put head through opening, Pull shirt over trunk          Upper body assist Assist Level: Supervision or verbal cues      Lower Body Dressing/Undressing Lower body dressing   What is the patient wearing?: Non-skid slipper socks, Pants     Pants- Performed by patient: Thread/unthread right pants leg, Pull pants up/down Pants- Performed by helper: Thread/unthread left  pants leg   Non-skid slipper socks- Performed by helper: Don/doff right sock, Don/doff left sock                  Lower body assist Assist for lower body dressing: Touching or steadying assistance (Pt > 75%)      Toileting Toileting   Toileting steps completed by patient: Adjust clothing prior to toileting, Performs perineal hygiene, Adjust clothing after toileting   Toileting Assistive Devices: Other (comment)(RW)  Toileting assist Assist  level: Touching or steadying assistance (Pt.75%)   Transfers Chair/bed transfer Chair/bed transfer activity did not occur: Safety/medical concerns           Locomotion Ambulation Ambulation activity did not occur: Safety/medical concerns   Max distance: 15ft Assist level: Touching or steadying assistance (Pt > 75%)   Wheelchair Wheelchair activity did not occur: Safety/medical concerns        Cognition Comprehension Comprehension assist level: Follows complex conversation/direction with no assist  Expression Expression assist level: Expresses complex ideas: With no assist  Social Interaction Social Interaction assist level: Interacts appropriately with others with medication or extra time (anti-anxiety, antidepressant).  Problem Solving Problem solving assist level: Solves basic problems with no assist  Memory Memory assist level: Recognizes or recalls 75 - 89% of the time/requires cueing 10 - 24% of the time   Medical Problem List and Plan: 1.  Generalized weakness, limitation in self-care secondary to debility.   -continue therapies 2.  New onset A fib/DVT Prophylaxis/Anticoagulation: Pharmaceutical: Other (comment)--Eliquis 3. Pain Management: tylenol prn   -appears to have right greater troch bursitis, tight TFL    -ice to hip, stretching exercises demonstrated, can work with PT also 4. Anxiety disorder/Mood: Continue Zoloft with ativan prn. Team to provide ego support. LCSW to follow for evaluation and support. Appears in good spirits today 5. Neuropsych: This patient is capable of making decisions on his own behalf. 6. Skin/Wound Care: Santyl with wet to dry dressing to areas of breakdown on sacrum.    - Encourage pressure relief measures.    -continue to push fluids 7. Fluids/Electrolytes/Nutrition: Monitor I/O. I personally reviewed the patient's labs today.      -encourage appropriate po intake. 8. OSA?Acute hypoxic RF: Continue albuterol nbes tid. Oxygen with  activity and CPAP at nights. Sleep study post discharge.    -will need some acclimation to CPAP pressure/duration 9. Acute systolic CHF: Question viral etiology. Monitor weights daily.    -Continue Coreg bid, Lasix bid,   Lipitor and baby ASA.    -BP's pretty soft, will hold cozaar 25 and norvasc 5mg  daily for now [facility] Weights   03/22/17 1540 03/23/17 1825 03/24/17 0500  Weight: (!) 139.2 kg (306 lb 14.1 oz) (!) 137.6 kg (303 lb 4.8 oz) (!) 143 kg (315 lb 4.8 oz)     -questions accuracy of weights today 10. Morbid Obesity: Encourage weight loss to promote health, respiratory status and mobility 11. Hypokalemia: Managed with supplement. wnl 12. New diagnosis T2DM: HGB A1C- 6.3. Monitor BS ac/hs with SSI for elevated BS.    -reasonable control at present 3/15 13. Follicular lymphoma: In remission.  14. Chronic BLE edema with venous stasis ulcer: UNNA boot in place LLE since 3/13   - Resume TEDs.   - Protein/vitamin supplement.  15.  Dysuria with hesitancy: UA negative, ucx pending   - urine cx with 100k GNR---begin empiric bactrim   -no PVR's    LOS (Days) 2 A FACE TO FACE EVALUATION WAS PERFORMED  Meredith Staggers, MD 03/24/2017 10:47 AM

## 2017-03-24 NOTE — Progress Notes (Signed)
Physical Therapy Session Note  Patient Details  Name: [name] MRN: [id number] Date of Birth: 10-09-1955  Today's Date: 03/24/2017 PT Individual Time: 1761-6073 PT Individual Time Calculation (min): 29 min   Short Term Goals: Week 1:  PT Short Term Goal 1 (Week 1): Pt will initiate stair training. PT Short Term Goal 2 (Week 1): Pt will ambulate 50 ft with LRAD & min assist. PT Short Term Goal 3 (Week 1): Pt will propel w/c 50 ft for strengthening purposes.  PT Short Term Goal 4 (Week 1): Pt will complete bed<>w/c with min assist & LRAD.  Skilled Therapeutic Interventions/Progress Updates:    Pt received in bed, agreeable to therapy. Pt's friend states he was able to maintain sitting in w/c long enough to each lunch but had to return to bed 2/2 sacral sore pain becoming intolerable. Pt requests alternate cushion for w/c because pain is too great to maintain sitting in w/c for extended periods of time. Therapist attempted to find appropriate pressure relieving cushion but was unable to; spoke to supervisor about ordering one. Therapist added folded blankets to each side of w/c to relieve pressure on sacrum for time being. Pt requests to perform bed level exercises for LE strengthening. Pt performed 1x20 of following exercises supine in bed: heel slides, PF, DF, quad sets, hip ADD with pillow. Pt reports fatigue level at 7/10 after activity. At end of session, pt left in bed, alarm on, all needs in reach and friend present.   Therapy Documentation Precautions:  Precautions Precautions: Fall Precaution Comments: supplemental oxygen Restrictions Weight Bearing Restrictions: No    See Function Navigator for Current Functional Status.   Therapy/Group: Individual Therapy  Caffie Damme 03/24/2017, 4:16 PM

## 2017-03-24 NOTE — Progress Notes (Signed)
Speech Language Pathology Daily Session Note  Patient Details  Name: [name] MRN: [id number] Date of Birth: [birth year]  Today's Date: 03/24/2017 SLP Individual Time: 0350-0938 SLP Individual Time Calculation (min): 41 min  Short Term Goals: Week 1: SLP Short Term Goal 1 (Week 1): Pt will consume current diet without overt s/s of aspiration and demonstrate use of compensatory swallowing strategies with Mod I.  SLP Short Term Goal 2 (Week 1): Pt will consume trials of regular textures with efficient mastication, complete oral clearance, and minimal overt s/s of aspiration with Supervision verbal cues over two sessions prior to upgrade. SLP Short Term Goal 3 (Week 1): Pt will self monitor and correct errors during functional tasks with Supervision verbal cues.  SLP Short Term Goal 4 (Week 1): Pt will complete complex problem solving tasks with Supervision verbal cues.  SLP Short Term Goal 5 (Week 1): Pt will utilize external memory aides to recall new daily information with Mod I.  Skilled Therapeutic Interventions: Skilled treatment session focused on dysphagia and cognitive goals. SLP facilitated session by providing skilled observation with trials of regular textures. Patient demonstrated efficient mastication with complete oral clearance without overt s/s of aspiration. Recommend trial tray tomorrow prior to upgrade. SLP also facilitated session by providing supervision-Min A verbal cues for recall of functional information as it relates to his care during functional conversation with physician. Patient's friend present and educated on patient's current cognitive and swallowing function and goals of skilled SLP interventions. She verbalized understanding and all questions answered at this time. Patient left upright in bed with friend present and alarm on. Continue with current plan of care.      Function:  Cognition Comprehension Comprehension assist level: Follows complex  conversation/direction with no assist  Expression   Expression assist level: Expresses complex ideas: With no assist  Social Interaction Social Interaction assist level: Interacts appropriately with others with medication or extra time (anti-anxiety, antidepressant).  Problem Solving Problem solving assist level: Solves basic problems with no assist  Memory Memory assist level: Recognizes or recalls 75 - 89% of the time/requires cueing 10 - 24% of the time    Pain No/Denies Pain   Therapy/Group: Individual Therapy  Vora Clover 03/24/2017, 8:56 AM

## 2017-03-24 NOTE — Plan of Care (Signed)
  RH PAIN MANAGEMENT RH STG PAIN MANAGED AT OR BELOW PT'S PAIN GOAL 03/24/2017 1554 - Progressing by Blinda Leatherwood, RN  Denies pain  RH SAFETY RH STG ADHERE TO SAFETY PRECAUTIONS W/ASSISTANCE/DEVICE Description STG Adhere to Safety Precautions With Assistance/Device. 03/24/2017 1554 - Progressing by Blinda Leatherwood, RN Encourages to use call light system and to safely transfer.

## 2017-03-24 NOTE — IPOC Note (Signed)
Overall Plan of Care [facility]) Patient Details Name: [name] MRN: [id number] DOB: 12/24/1955  Admitting Diagnosis: <principal problem not specified>debility  Hospital Problems: Active Problems:   Debility     Functional Problem List: Nursing Edema, Endurance, Medication Management, Motor, Nutrition, Pain, Perception, Safety, Sensory  PT Balance, Safety, Behavior, Sensory, Edema, Skin Integrity, Endurance, Motor, Pain  OT Balance, Endurance, Motor, Pain, Skin Integrity, Sensory, Safety  SLP Cognition  TR         Basic ADL's: OT Dressing, Bathing, Other (comment), Toileting(functional activity tolerance)     Advanced  ADL's: OT       Transfers: PT Bed Mobility, Bed to Chair, Car, Manufacturing systems engineer, Metallurgist: PT Ambulation, Emergency planning/management officer, Stairs     Additional Impairments: OT    SLP Swallowing, Social Cognition   Memory, Problem Solving, Attention, Awareness  TR      Anticipated Outcomes Item Anticipated Outcome  Self Feeding mod I  Swallowing  Mod I   Basic self-care  (S)-mod I  Toileting  (S)- mod I   Bathroom Transfers (S)- mod I  Bowel/Bladder  mod I  Transfers  supervision with LRAD  Locomotion  supervision with LRAD  Communication  N/A  Cognition  Mod I  Pain  less 2  Safety/Judgment  mod I   Therapy Plan: PT Intensity: Minimum of 1-2 x/day ,45 to 90 minutes PT Frequency: 5 out of 7 days PT Duration Estimated Length of Stay: 2-2.5 weeks OT Intensity: Minimum of 1-2 x/day, 45 to 90 minutes OT Frequency: 5 out of 7 days OT Duration/Estimated Length of Stay: 14-18 days SLP Intensity: Minumum of 1-2 x/day, 30 to 90 minutes SLP Frequency: 3 to 5 out of 7 days SLP Duration/Estimated Length of Stay: 18-22 days    Team Interventions: Nursing Interventions Patient/Family Education, Pain Management, Skin Care/Wound Management, Dysphagia/Aspiration Precaution Training, Psychosocial Support, Disease  Management/Prevention, Medication Management, Cognitive Remediation/Compensation, Discharge Planning  PT interventions Ambulation/gait training, Community reintegration, DME/adaptive equipment instruction, Neuromuscular re-education, Psychosocial support, Stair training, UE/LE Strength taining/ROM, Wheelchair propulsion/positioning, UE/LE Coordination activities, Therapeutic Activities, Skin care/wound management, Pain management, Discharge planning, Training and development officer, Cognitive remediation/compensation, Disease management/prevention, Functional mobility training, Patient/family education, Splinting/orthotics, Therapeutic Exercise, Visual/perceptual remediation/compensation  OT Interventions Balance/vestibular training, Discharge planning, Pain management, Self Care/advanced ADL retraining, Therapeutic Activities, UE/LE Coordination activities, Disease mangement/prevention, Functional mobility training, Patient/family education, Skin care/wound managment, Therapeutic Exercise, Community reintegration, Engineer, drilling, UE/LE Strength taining/ROM  SLP Interventions Cognitive remediation/compensation, Environmental controls, Multimodal communication approach, Therapeutic Activities, Functional tasks, Cueing hierarchy, Internal/external aids, Dysphagia/aspiration precaution training, Patient/family education  TR Interventions    SW/CM Interventions Discharge Planning, Psychosocial Support, Patient/Family Education   Barriers to Discharge MD  Medical stability  Nursing Other (comments)    PT      OT      SLP Decreased caregiver support    SW       Team Discharge Planning: Destination: PT-Home ,OT- Home , SLP-Home Projected Follow-up: PT-(HHPT vs OPPT), OT-  Home health OT, SLP-(TBD) Projected Equipment Needs: PT-To be determined, OT-  , SLP-None recommended by SLP Equipment Details: PT- , OT-  Patient/family involved in discharge planning: PT- Patient, Family  member/caregiver,  OT-Patient, SLP-Patient  MD ELOS: 14-18 days Medical Rehab Prognosis:  Excellent Assessment: The patient has been admitted for CIR therapies with the diagnosis of debility after multiple medical issues. The team will be addressing functional mobility, strength, stamina, balance, safety, adaptive techniques and equipment, self-care, bowel and  bladder mgt, patient and caregiver education, cognition, communication, behavior, activity tolerance. Goals have been set at supervision to mod I with basic self-care and ADL's, supervision with transfers and mobility and mod I with cognition.    Meredith Staggers, MD, FAAPMR      See Team Conference Notes for weekly updates to the plan of care

## 2017-03-24 NOTE — Progress Notes (Signed)
Physical Therapy Session Note  Patient Details  Name: [name] MRN: [id number] Date of Birth: 25-Aug-1955  Today's Date: 03/24/2017 PT Individual Time: 8921-1941 PT Individual Time Calculation (min): 56 min  Short Term Goals: Week 1:  PT Short Term Goal 1 (Week 1): Pt will initiate stair training. PT Short Term Goal 2 (Week 1): Pt will ambulate 50 ft with LRAD & min assist. PT Short Term Goal 3 (Week 1): Pt will propel w/c 50 ft for strengthening purposes.  PT Short Term Goal 4 (Week 1): Pt will complete bed<>w/c with min assist & LRAD.  Skilled Therapeutic Interventions/Progress Updates:   Pt received in bed with friend Jenny Reichmann) present for session. No c/o pain reported. BP assessed, please see below. Pt with c/o lightheadedness (2/10) with positional changes; educated pt on need to stand and assess symptoms before moving. Pt transferred to w/c and propelled w/c to gym with BUE & supervision, taking rest breaks PRN; activity focused on BUE strengthening & cardiopulmonary endurance training. In gym pt transferred sit>stand with min assist, using momentum to assist with transfer. Pt tolerated standing 1 minute + 30 seconds while engaging in game of connect four with task focusing on BLE strengthening & activity tolerance. Pt on 3L/min supplemental oxygen throughout session & SpO2 remained > or = 90% throughout. At end of session pt left sitting in w/c with Cindy present & in care of NT.  BP assessed in LUE, LLE ted hose donned: Supine: 100/59 mmHg Sitting EOB: 109/72 mmHg  Standing at 0 minutes: 118/91 mmHg   Therapy Documentation Precautions:  Precautions Precautions: Fall Precaution Comments: supplemental oxygen Restrictions Weight Bearing Restrictions: No   See Function Navigator for Current Functional Status.   Therapy/Group: Individual Therapy  Waunita Schooner 03/24/2017, 4:55 PM

## 2017-03-25 ENCOUNTER — Inpatient Hospital Stay (HOSPITAL_COMMUNITY): Payer: BLUE CROSS/BLUE SHIELD | Admitting: Physical Therapy

## 2017-03-25 ENCOUNTER — Inpatient Hospital Stay (HOSPITAL_COMMUNITY): Payer: BLUE CROSS/BLUE SHIELD | Admitting: Speech Pathology

## 2017-03-25 ENCOUNTER — Inpatient Hospital Stay (HOSPITAL_COMMUNITY): Payer: BLUE CROSS/BLUE SHIELD | Admitting: Occupational Therapy

## 2017-03-25 LAB — GLUCOSE, CAPILLARY
GLUCOSE-CAPILLARY: 107 mg/dL — AB (ref 65–99)
GLUCOSE-CAPILLARY: 100 mg/dL — AB (ref 65–99)
Glucose-Capillary: 101 mg/dL — ABNORMAL HIGH (ref 65–99)
Glucose-Capillary: 139 mg/dL — ABNORMAL HIGH (ref 65–99)

## 2017-03-25 NOTE — Progress Notes (Signed)
[place] PHYSICAL MEDICINE & REHABILITATION     PROGRESS NOTE    Subjective/Complaints: Had a good day of therapy and good night of sleep as well.  He did not use his CPAP as he was lying on his side.  Blood pressures seem to hold better yesterday with activities.  ROS: Patient denies fever, rash, sore throat, blurred vision, nausea, vomiting, diarrhea, cough, shortness of breath or chest pain, joint or back pain, headache, or mood change.   Objective: Vital Signs: Blood pressure 100/60, pulse 77, temperature 99.5 F (37.5 C), temperature source Oral, resp. rate 18, height 6\' 1"  (1.854 m), weight (!) 140.6 kg (310 lb), SpO2 96 %. No results found. Recent Labs    03/23/17 0429  WBC 12.6*  HGB 12.2*  HCT 37.8*  PLT 272   Recent Labs    03/23/17 0429  NA 132*  K 4.2  CL 95*  GLUCOSE 115*  BUN 19  CREATININE 0.69  CALCIUM 8.7*   CBG (last 3)  Recent Labs    03/24/17 1638 03/24/17 2111 03/25/17 0653  GLUCAP 107* 126* 101*    Wt Readings from Last 3 Encounters:  03/25/17 (!) 140.6 kg (310 lb)  03/22/17 132.2 kg (291 lb 7.2 oz)  11/07/16 16.5 kg (36 lb 6.4 oz)    Physical Exam:  Constitutional: obese  No distress . Vital signs reviewed. HEENT: EOMI, oral membranes moist Cardiovascular: RRR without murmur. No JVD    Respiratory: CTA Bilaterally without wheezes or rales. Normal effort    GI: BS +, non-tender, non-distended    Musculoskeletal: He exhibits no edema or tenderness.  BLE with venous statis changes  and 1+ pedal edema. RLE remains wrapped in UNNA. LLE with trace edema Neurological: He is alert and oriented to person, place, and time. No cranial nerve deficit.  Motor: UE: 4/5 prox to distal. senses pain and light touch in all 4's--stable Musc: pain along right greater troch. Pain with cross legged maneuver--perhaps less tender Skin: Skin is warm and dry.  Sacral wound about 1" with mild fibronrecrotic tissue at center, shallow and closing Psychiatric:  Pleasant and appropriate    Assessment/Plan: 1. Functional and mobility deficits secondary to debility which require 3+ hours per day of interdisciplinary therapy in a comprehensive inpatient rehab setting. Physiatrist is providing close team supervision and 24 hour management of active medical problems listed below. Physiatrist and rehab team continue to assess barriers to discharge/monitor patient progress toward functional and medical goals.  Function:  Bathing Bathing position   Position: Wheelchair/chair at sink  Bathing parts Body parts bathed by patient: Right arm, Left arm, Chest, Abdomen, Front perineal area, Right upper leg, Left upper leg, Buttocks Body parts bathed by helper: Left lower leg, Back  Bathing assist Assist Level: Touching or steadying assistance(Pt > 75%)      Upper Body Dressing/Undressing Upper body dressing   What is the patient wearing?: Pull over shirt/dress     Pull over shirt/dress - Perfomed by patient: Thread/unthread right sleeve, Thread/unthread left sleeve, Put head through opening, Pull shirt over trunk          Upper body assist Assist Level: Supervision or verbal cues      Lower Body Dressing/Undressing Lower body dressing   What is the patient wearing?: Pants, Non-skid slipper socks     Pants- Performed by patient: Thread/unthread right pants leg, Pull pants up/down, Thread/unthread left pants leg Pants- Performed by helper: Thread/unthread left pants leg   Non-skid slipper socks-  Performed by helper: Don/doff right sock, Don/doff left sock                  Lower body assist Assist for lower body dressing: Touching or steadying assistance (Pt > 75%)      Toileting Toileting   Toileting steps completed by patient: Adjust clothing prior to toileting, Performs perineal hygiene, Adjust clothing after toileting   Toileting Assistive Devices: Grab bar or rail  Toileting assist Assist level: Supervision or verbal cues    Transfers Chair/bed transfer Chair/bed transfer activity did not occur: Safety/medical concerns Chair/bed transfer method: Ambulatory Chair/bed transfer assist level: Touching or steadying assistance (Pt > 75%) Chair/bed transfer assistive device: Medical sales representative Ambulation activity did not occur: Safety/medical concerns   Max distance: 5 ft Assist level: Touching or steadying assistance (Pt > 75%)   Wheelchair Wheelchair activity did not occur: Safety/medical concerns Type: Manual Max wheelchair distance: 150 ft Assist Level: Supervision or verbal cues  Cognition Comprehension Comprehension assist level: Follows complex conversation/direction with no assist  Expression Expression assist level: Expresses complex ideas: With no assist  Social Interaction Social Interaction assist level: Interacts appropriately with others with medication or extra time (anti-anxiety, antidepressant).  Problem Solving Problem solving assist level: Solves basic problems with no assist  Memory Memory assist level: Recognizes or recalls 75 - 89% of the time/requires cueing 10 - 24% of the time   Medical Problem List and Plan: 1.  Generalized weakness, limitation in self-care secondary to debility.   -continue therapies 2.  New onset A fib/DVT Prophylaxis/Anticoagulation: Pharmaceutical: Other (comment)--Eliquis 3. Pain Management: tylenol prn   -appears to have right greater troch bursitis, tight TFL    -Continue ice to hip, stretching exercises, can work with PT also 4. Anxiety disorder/Mood: Continue Zoloft with ativan prn. Team to provide ego support. LCSW to follow for evaluation and support. Appears in good spirits today 5. Neuropsych: This patient is capable of making decisions on his own behalf. 6. Skin/Wound Care: Santyl with wet to dry dressing to areas of breakdown on sacrum.    - Encourage pressure relief measures.    -continue to push fluids 7.  Fluids/Electrolytes/Nutrition: Monitor I/O. I personally reviewed the patient's labs today.      -encourage appropriate po intake. 8. OSA?Acute hypoxic RF: Continue albuterol nbes tid. Oxygen with activity and CPAP at nights. Sleep study post discharge.    -will need some acclimation to CPAP pressure/duration 9. Acute systolic CHF: Question viral etiology. Monitor weights daily.    -Continue Coreg bid, Lasix bid,   Lipitor and baby ASA.    -BP's remain pretty soft, holding Cozaar 25 and norvasc 5mg  daily for now [facility] Weights   03/23/17 1825 03/24/17 0500 03/25/17 0410  Weight: (!) 137.6 kg (303 lb 4.8 oz) (!) 143 kg (315 lb 4.8 oz) (!) 140.6 kg (310 lb)     -Continue to monitor for pattern 10. Morbid Obesity: Encourage weight loss to promote health, respiratory status and mobility 11. Hypokalemia: Managed with supplement. wnl 12. New diagnosis T2DM: HGB A1C- 6.3. Monitor BS ac/hs with SSI for elevated BS.    -reasonable control at present 3/16 13. Follicular lymphoma: In remission.  14. Chronic BLE edema with venous stasis ulcer: UNNA boot in place LLE since 3/13   - Resumed TEDs.   - Protein/vitamin supplement.  15.  Dysuria with hesitancy:     - urine cx with 100k GNR--- Klebsiella pneumoniae and Proteus mirabilis    -  continue empiric bactrim, susceptibility still pending   -no PVR's    LOS (Days) 3 A FACE TO FACE EVALUATION WAS PERFORMED  Meredith Staggers, MD 03/25/2017 8:34 AM

## 2017-03-25 NOTE — Progress Notes (Signed)
Speech Language Pathology Daily Session Note  Patient Details  Name: [name] MRN: [id number] Date of Birth: Jul 17, 1955  Today's Date: 03/25/2017 SLP Individual Time: 1130-1155 SLP Individual Time Calculation (min): 25 min  Short Term Goals: Week 1: SLP Short Term Goal 1 (Week 1): Pt will consume current diet without overt s/s of aspiration and demonstrate use of compensatory swallowing strategies with Mod I.  SLP Short Term Goal 2 (Week 1): Pt will consume trials of regular textures with efficient mastication, complete oral clearance, and minimal overt s/s of aspiration with Supervision verbal cues over two sessions prior to upgrade. SLP Short Term Goal 3 (Week 1): Pt will self monitor and correct errors during functional tasks with Supervision verbal cues.  SLP Short Term Goal 4 (Week 1): Pt will complete complex problem solving tasks with Supervision verbal cues.  SLP Short Term Goal 5 (Week 1): Pt will utilize external memory aides to recall new daily information with Mod I.  Skilled Therapeutic Interventions: Skilled treatment session focused on dysphagia goals. SLP facilitated session by providing skilled observation with upgraded lunch meal of regular textures with thin liquids. Patient required intermittent supervision verbal cues for use of a chin tuck and demonstrated intermittent, subtle coughing. However, difficulty to differentiate baseline coughing vs coughing due to dysphagia. Patient demonstrated efficient mastication with complete oral clearance and denied any sensation of pharyngeal residuals. Recommend patient upgrade to regular textures and continue intermittent supervision. Patient and his sister verbalized understanding and agreement. Patient left upright in bed with alarm on and all needs within reach. Continue with current plan of care.      Function:  Eating Eating   Modified Consistency Diet: No Eating Assist Level: Supervision or verbal cues            Cognition Comprehension Comprehension assist level: Follows complex conversation/direction with no assist  Expression   Expression assist level: Expresses complex ideas: With no assist  Social Interaction Social Interaction assist level: Interacts appropriately with others with medication or extra time (anti-anxiety, antidepressant).  Problem Solving Problem solving assist level: Solves basic problems with no assist  Memory Memory assist level: Recognizes or recalls 75 - 89% of the time/requires cueing 10 - 24% of the time    Pain No/Denies Pain   Therapy/Group: Individual Therapy  Virginia Francisco 03/25/2017, 12:17 PM

## 2017-03-25 NOTE — Progress Notes (Signed)
Physical Therapy Session Note  Patient Details  Name: [name] MRN: [id number] Date of Birth: 25-Jan-1955  Today's Date: 03/25/2017 PT Individual Time: 2446-9507 AND 1515-1610 PT Individual Time Calculation (min): 56 min AND 55 min   Short Term Goals: Week 1:  PT Short Term Goal 1 (Week 1): Pt will initiate stair training. PT Short Term Goal 2 (Week 1): Pt will ambulate 50 ft with LRAD & min assist. PT Short Term Goal 3 (Week 1): Pt will propel w/c 50 ft for strengthening purposes.  PT Short Term Goal 4 (Week 1): Pt will complete bed<>w/c with min assist & LRAD.  Skilled Therapeutic Interventions/Progress Updates:   Session 1:  Pt in supine and agreeable to therapy, no c/o pain. Took vitals in supine and when seated at EOB, both WNL and pt c/o mild dizziness w/ position change that resolved w/ a few minutes of rest prior to transferring to w/c. Transferred to EOB w/ supervision and to w/c via stand pivot w/ RW, min guard. Increased work of breathing w/ all OOB activity this session, requiring frequent rest breaks and increased time, O2 >90% throughout session on 3L/min. Verbal and visual cues for breathing strategies. Pt self-propelled w/c 150' w/ supervision using BUEs for general strength and conditioning, multiple brief rest breaks to decrease work of breathing. Worked on standing tolerance in day room. Performed 2 stands to high/low table in day room w/ min guard and maintained static stance 2 min and 30 sec w/ supervision while playing game in standing. Returned to room total assist in w/c for time management and transferred back to EOB and to supine. Ended session in supine, call bell within reach and all needs met.   Session 2:  Pt in supine and agreeable to therapy, no c/o pain. Pt required decreased rest breaks this session compared to morning session and denied dizziness w/ position changes. Focused on overall endurance to upright and OOB activity this session. Transferred to EOB w/  supervision. Ambulated from EOB w/ w/c follow and min guard from therapist in multiple 20-30' bouts to total 150'. Seated rest breaks 2/2 fatigue and increased work of breathing that resolved w/ brief rest breaks. Performed kinetron 3 min x2 at 90 cm/sec while seated in w/c to work on LE strengthening and endurance. Returned to room in w/c and transferred to EOB and to supine min assist. Ended session in supine, call bell within reach and all needs met.   Therapy Documentation Precautions:  Precautions Precautions: Fall Precaution Comments: supplemental oxygen Restrictions Weight Bearing Restrictions: No  See Function Navigator for Current Functional Status.   Therapy/Group: Individual Therapy  Dymond Spreen K Arnette 03/25/2017, 9:57 AM

## 2017-03-25 NOTE — Progress Notes (Signed)
03-25-17  IV Team Note;  Pt has a triple lumen picc, placed Feb 14;  No longer being used;  Also has an implanted portacath;  Can picc line be removed?  Please advise.  Thank you!  Raynelle Fanning RN IV Team

## 2017-03-25 NOTE — Progress Notes (Signed)
Occupational Therapy Session Note  Patient Details  Name: [name] MRN: [id number] Date of Birth: 1955-09-22  Today's Date: 03/25/2017 OT Individual Time: 0705-0800 OT Individual Time Calculation (min): 55 min    Short Term Goals: Week 1:  OT Short Term Goal 1 (Week 1): Pt will complete all toileting tasks with CGA OT Short Term Goal 2 (Week 1): Pt will don socks with CGA and no more than 2 rest breaks OT Short Term Goal 3 (Week 1): Pt will stand at sink during oral care for 2 minutes with no rest break OT Short Term Goal 4 (Week 1): Pt will navigate around room with RW safely to obtain clothing  Skilled Therapeutic Interventions/Progress Updates:   Upon entering the room, pt supine in bed with family member present in room. Pt declined bathing and dressing this session. Pt requesting to use bathroom for BM. Pt ambulated with RW 10' on 3 L O2 via [place] with overall steady assistance. Pt performed clothing management and hygiene with close supervision for safety. Pt returning to sink for hand hygiene with overall supervision. Pt shaved face, cut hair, and brushed teeth with overall set up A to obtain needed items. Pt required multiple rest breaks secondary to fatigue this session. Pt remaine din wheelchair with family present and call bell within reach. Breakfast tray placed in front of pt as OT exited the room.    Therapy Documentation Precautions:  Precautions Precautions: Fall Precaution Comments: supplemental oxygen Restrictions Weight Bearing Restrictions: No General: Pain: Pain Assessment Pain Assessment: 0-10 Pain Score: 6  Pain Type: Acute pain Pain Location: Buttocks Pain Orientation: Right;Left Pain Intervention(s): Medication (See eMAR) ADL: ADL ADL Comments: see functional navigator Vision   Perception    Praxis   Exercises:   Other Treatments:    See Function Navigator for Current Functional Status.   Therapy/Group: Individual Therapy  Gypsy Decant 03/25/2017, 12:25 PM

## 2017-03-25 NOTE — Progress Notes (Signed)
Patient placed on full face mask CPAP and tolerating well. O2 bleeding into machine and patient tolerating all well.

## 2017-03-26 ENCOUNTER — Inpatient Hospital Stay (HOSPITAL_COMMUNITY): Payer: BLUE CROSS/BLUE SHIELD | Admitting: Occupational Therapy

## 2017-03-26 LAB — URINE CULTURE: Culture: >=100000 — AB

## 2017-03-26 LAB — GLUCOSE, CAPILLARY
GLUCOSE-CAPILLARY: 101 mg/dL — AB (ref 65–99)
GLUCOSE-CAPILLARY: 146 mg/dL — AB (ref 65–99)
Glucose-Capillary: 99 mg/dL (ref 65–99)
Glucose-Capillary: 130 mg/dL — ABNORMAL HIGH (ref 65–99)

## 2017-03-26 NOTE — Progress Notes (Signed)
[place] PHYSICAL MEDICINE & REHABILITATION     PROGRESS NOTE    Subjective/Complaints: Feels that therapy went very well yesterday.  May progress with activity tolerance.  Used  CPAP briefly last night and tolerated well.  ROS: Patient denies fever, rash, sore throat, blurred vision, nausea, vomiting, diarrhea, cough, shortness of breath or chest pain, joint or back pain, headache, or mood change.   Objective: Vital Signs: Blood pressure 100/65, pulse 79, temperature 98.7 F (37.1 C), temperature source Oral, resp. rate 18, height 6\' 1"  (1.854 m), weight (!) 140.6 kg (310 lb), SpO2 94 %. No results found. No results for input(s): WBC, HGB, HCT, PLT in the last 72 hours. No results for input(s): NA, K, CL, GLUCOSE, BUN, CREATININE, CALCIUM in the last 72 hours.  Invalid input(s): CO CBG (last 3)  Recent Labs    03/25/17 1646 03/25/17 2055 03/26/17 0656  GLUCAP 100* 139* 99    Wt Readings from Last 3 Encounters:  03/25/17 (!) 140.6 kg (310 lb)  03/22/17 132.2 kg (291 lb 7.2 oz)  11/07/16 16.5 kg (36 lb 6.4 oz)    Physical Exam:  Constitutional: No distress . Vital signs reviewed. HEENT: EOMI, oral membranes moist Cardiovascular: RRR without murmur. No JVD    Respiratory: CTA Bilaterally without wheezes or rales. Normal effort    GI: BS +, non-tender, non-distended   Musculoskeletal: He exhibits no edema or tenderness.  BLE with venous statis changes  and 1+ pedal edema. RLE remains wrapped in UNNA. LLE with trace edema Neurological: He is alert and oriented to person, place, and time. No cranial nerve deficit.  Motor: UE: 4/5 prox to distal. senses pain and light touch in all 4's--stable Musc: pain along right greater troch. + cross legged maneuver-  Skin: Skin is warm and dry.  Sacral wound about 1" with mild fibronrecrotic tissue at center, shallow and closing--stable in appearance Psychiatric: Pleasant and appropriate    Assessment/Plan: 1. Functional and  mobility deficits secondary to debility which require 3+ hours per day of interdisciplinary therapy in a comprehensive inpatient rehab setting. Physiatrist is providing close team supervision and 24 hour management of active medical problems listed below. Physiatrist and rehab team continue to assess barriers to discharge/monitor patient progress toward functional and medical goals.  Function:  Bathing Bathing position   Position: Wheelchair/chair at sink  Bathing parts Body parts bathed by patient: Right arm, Left arm, Chest, Abdomen, Front perineal area, Right upper leg, Left upper leg, Buttocks Body parts bathed by helper: Left lower leg, Back  Bathing assist Assist Level: Touching or steadying assistance(Pt > 75%)      Upper Body Dressing/Undressing Upper body dressing   What is the patient wearing?: Pull over shirt/dress     Pull over shirt/dress - Perfomed by patient: Thread/unthread right sleeve, Thread/unthread left sleeve, Put head through opening, Pull shirt over trunk          Upper body assist Assist Level: Supervision or verbal cues      Lower Body Dressing/Undressing Lower body dressing   What is the patient wearing?: Pants, Non-skid slipper socks     Pants- Performed by patient: Thread/unthread right pants leg, Pull pants up/down, Thread/unthread left pants leg Pants- Performed by helper: Thread/unthread left pants leg   Non-skid slipper socks- Performed by helper: Don/doff right sock, Don/doff left sock                  Lower body assist Assist for lower body dressing: Touching  or steadying assistance (Pt > 75%)      Toileting Toileting   Toileting steps completed by patient: Adjust clothing prior to toileting, Performs perineal hygiene, Adjust clothing after toileting   Toileting Assistive Devices: Grab bar or rail  Toileting assist Assist level: Supervision or verbal cues   Transfers Chair/bed transfer Chair/bed transfer activity did not occur:  Safety/medical concerns Chair/bed transfer method: Stand pivot Chair/bed transfer assist level: Touching or steadying assistance (Pt > 75%) Chair/bed transfer assistive device: Medical sales representative Ambulation activity did not occur: Safety/medical concerns   Max distance: 10' Assist level: Touching or steadying assistance (Pt > 75%)   Wheelchair Wheelchair activity did not occur: Safety/medical concerns Type: Manual Max wheelchair distance: 150 ft Assist Level: Supervision or verbal cues  Cognition Comprehension Comprehension assist level: Follows complex conversation/direction with no assist  Expression Expression assist level: Expresses complex ideas: With no assist  Social Interaction Social Interaction assist level: Interacts appropriately with others with medication or extra time (anti-anxiety, antidepressant).  Problem Solving Problem solving assist level: Solves basic problems with no assist  Memory Memory assist level: Recognizes or recalls 75 - 89% of the time/requires cueing 10 - 24% of the time   Medical Problem List and Plan: 1.  Generalized weakness, limitation in self-care secondary to debility.   -continue therapies.  Making gains with stamina 2.  New onset A fib/DVT Prophylaxis/Anticoagulation: Pharmaceutical: Other (comment)--Eliquis 3. Pain Management: tylenol prn   -appears to have right greater troch bursitis, tight TFL    -Continue ice to hip, stretching exercises, can work with PT also 4. Anxiety disorder/Mood: Continue Zoloft with ativan prn. Team to provide ego support. LCSW to follow for evaluation and support. Appears in good spirits today 5. Neuropsych: This patient is capable of making decisions on his own behalf. 6. Skin/Wound Care: Santyl with wet to dry dressing to areas of breakdown on sacrum.    - Encourage pressure relief measures.    -continue to push fluids 7. Fluids/Electrolytes/Nutrition: Monitor I/O. I personally reviewed the  patient's labs today.      -encourage appropriate po intake. 8. OSA?Acute hypoxic RF: Continue albuterol nbes tid. Oxygen with activity and CPAP at nights. Sleep study post discharge.     -will need some acclimation to CPAP pressure/duration 9. Acute systolic CHF: Question viral etiology. Monitor weights daily.    -Continue Coreg bid, Lasix bid,   Lipitor and baby ASA.    -BP's remain pretty soft, continue to hold Cozaar 25 and norvasc 5mg  daily for now [facility] Weights   03/23/17 1825 03/24/17 0500 03/25/17 0410  Weight: (!) 137.6 kg (303 lb 4.8 oz) (!) 143 kg (315 lb 4.8 oz) (!) 140.6 kg (310 lb)     -Weights showing an upward trend but inconsistent 10. Morbid Obesity: Encourage weight loss to promote health, respiratory status and mobility 11. Hypokalemia: Managed with supplement. wnl 12. New diagnosis T2DM: HGB A1C- 6.3. Monitor BS ac/hs with SSI for elevated BS.    -reasonable control at present 3/17 13. Follicular lymphoma: In remission.  14. Chronic BLE edema with venous stasis ulcer: UNNA boot in place LLE since 3/13   - Resumed TEDs.   - Protein/vitamin supplement.  15.  Dysuria with hesitancy:     - urine cx with 100k GNR--- Klebsiella pneumoniae and Proteus mirabilis    -Proteus is sensitive to Bactrim.  Klebsiella sensitivities pending        LOS (Days) 4 A FACE TO FACE EVALUATION  WAS PERFORMED  Meredith Staggers, MD 03/26/2017 7:54 AM

## 2017-03-26 NOTE — Progress Notes (Signed)
Occupational Therapy Session Note  Patient Details  Name: [name] MRN: [id number] Date of Birth: Jun 18, 1955  Today's Date: 03/26/2017 OT Individual Time:  - 0945-1045  (60 min)      Short Term Goals: Week 1:  OT Short Term Goal 1 (Week 1): Pt will complete all toileting tasks with CGA OT Short Term Goal 2 (Week 1): Pt will don socks with CGA and no more than 2 rest breaks OT Short Term Goal 3 (Week 1): Pt will stand at sink during oral care for 2 minutes with no rest break OT Short Term Goal 4 (Week 1): Pt will navigate around room with RW safely to obtain clothing         Skilled Therapeutic Interventions/Progress Updates:    Pt supine in bed upon OT entry.  Addressed ADL retraining, bed mobility, standing balance, functional mobility, diaphragmatic breathing, ECT,.   Pt required min verbal cues for rolling and using RUE to come up to sitting.  Sit to stand= SBA; Ambulated to sink with RW.  Stood and brushed teeth and comb hair for 3-4 minutes with  CGA.  OT assisted with peri care. RN provided oitment for buttocks.  OT provided lotion to back.  Instructed pt on breathing exerlcises throughtout session.  Pt on 3 liters O2.  Pt rolled wc to bed.  Transferred to bed CGA and sit to supine with mod I.     Therapy Documentation Precautions:  Precautions Precautions: Fall Precaution Comments: supplemental oxygen Restrictions Weight Bearing Restrictions: No    Vital Signs Pain: Pain Assessment Pain Assessment: No/denies pain Pain Score: 0-No pain ADL: ADL ADL Comments: see functional navigator Vision:  Decreased attention to left             See Function Navigator for Current Functional Status.   Therapy/Group: Individual Therapy  Lisa Roca 03/26/2017, 10:30 AM

## 2017-03-27 ENCOUNTER — Inpatient Hospital Stay (HOSPITAL_COMMUNITY): Payer: BLUE CROSS/BLUE SHIELD | Admitting: Speech Pathology

## 2017-03-27 ENCOUNTER — Inpatient Hospital Stay (HOSPITAL_COMMUNITY): Payer: BLUE CROSS/BLUE SHIELD | Admitting: Physical Therapy

## 2017-03-27 ENCOUNTER — Inpatient Hospital Stay (HOSPITAL_COMMUNITY): Payer: BLUE CROSS/BLUE SHIELD | Admitting: Occupational Therapy

## 2017-03-27 LAB — BASIC METABOLIC PANEL
ANION GAP: 10 (ref 5–15)
BUN: 22 mg/dL — ABNORMAL HIGH (ref 6–20)
CALCIUM: 8.9 mg/dL (ref 8.9–10.3)
CHLORIDE: 98 mmol/L — AB (ref 101–111)
CO2: 26 mmol/L (ref 22–32)
Creatinine, Ser: 0.81 mg/dL (ref 0.61–1.24)
GFR calc non Af Amer: >60 mL/min (ref >60)
GLUCOSE: 114 mg/dL — AB (ref 65–99)
Potassium: 4.2 mmol/L (ref 3.5–5.1)
Sodium: 134 mmol/L — ABNORMAL LOW (ref 135–145)

## 2017-03-27 LAB — GLUCOSE, CAPILLARY
GLUCOSE-CAPILLARY: 102 mg/dL — AB (ref 65–99)
GLUCOSE-CAPILLARY: 120 mg/dL — AB (ref 65–99)
GLUCOSE-CAPILLARY: 130 mg/dL — AB (ref 65–99)
Glucose-Capillary: 101 mg/dL — ABNORMAL HIGH (ref 65–99)

## 2017-03-27 MED ORDER — PANTOPRAZOLE SODIUM 40 MG PO TBEC
40.0000 mg | DELAYED_RELEASE_TABLET | Freq: Every day | ORAL | Status: DC
  Administered 2017-03-27 – 2017-04-06 (×11): 40 mg via ORAL
  Filled 2017-03-27 (×11): qty 1

## 2017-03-27 NOTE — Progress Notes (Signed)
Physical Therapy Session Note  Patient Details  Name: KYION GAUTIER MRN: [id number] Date of Birth: 11/26/1955  Today's Date: 03/27/2017 PT Individual Time: 1034-1200 PT Individual Time Calculation (min): 86 min   Short Term Goals: Week 1:  PT Short Term Goal 1 (Week 1): Pt will initiate stair training. PT Short Term Goal 2 (Week 1): Pt will ambulate 50 ft with LRAD & min assist. PT Short Term Goal 3 (Week 1): Pt will propel w/c 50 ft for strengthening purposes.  PT Short Term Goal 4 (Week 1): Pt will complete bed<>w/c with min assist & LRAD.  Skilled Therapeutic Interventions/Progress Updates:   Pt received in bed, agreeable to therapy. Pt denies pain but reports fatigue 2/2 earlier therapy sessions. Pt on 3L O2 [place] and complains of a burning sensation in nose 2/2 O2. Pt transfers EOB supervision. Pt ambulates 60ft, 36ft, 66ft, with RW steady assist, requiring multiple rest breaks and verbal cuing to maintain RW within BOS. O2 levels at 94% with activity and return to 96% with rest. Pt performs kinetron seated in w/c 60cm/s for 5 minutes, with multiple rest breaks 2/2 fatigue and decreased activity tolerance. Pt reports 5/10 difficulty level. Pt instructed on pursed lip breathing to increase oxygen intake after activity. After activity, pt reports "quarter of a tank" left of energy. Therapist propelled pt in w/c to gym for energy conservation. Pt performs w/c<> car transfer with RW steady assist and verbal cuing for safety. 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 BP was 93/52 and pt complained of fatigue and mild dizziness. Therapist propelled w/c back to room. Pt was educated on energy conservation and was given handouts for future reference. Pt was given an incentive spirometer and instructed to use 10x BID. At end of session, pt's BP was 117/63 and O2 97%. Pt returned to bed, sister present, all needs within reach.   Therapy Documentation Precautions:   Precautions Precautions: Fall Precaution Comments: supplemental oxygen Restrictions Weight Bearing Restrictions: No   See Function Navigator for Current Functional Status.   Therapy/Group: Individual Therapy  Caffie Damme 03/27/2017, 4:01 PM

## 2017-03-27 NOTE — Progress Notes (Signed)
Occupational Therapy Session Note  Patient Details  Name: [name] MRN: [id number] Date of Birth: 17-Feb-1955  Today's Date: 03/27/2017 OT Individual Time: 0370-4888 OT Individual Time Calculation (min): 58 min    Short Term Goals: Week 1:  OT Short Term Goal 1 (Week 1): Pt will complete all toileting tasks with CGA OT Short Term Goal 2 (Week 1): Pt will don socks with CGA and no more than 2 rest breaks OT Short Term Goal 3 (Week 1): Pt will stand at sink during oral care for 2 minutes with no rest break OT Short Term Goal 4 (Week 1): Pt will navigate around room with RW safely to obtain clothing  Skilled Therapeutic Interventions/Progress Updates:    Upon entering the room, pt seated on toilet with family members present in the room. Pt agreeable to OT intervention thi session and reports no c/o pain. Pt washed peri area and donned clean pants while seated on toilet with close supervision. Pt returning to sit in wheelchair at sink for UB self care with overall supervision and set up to obtain some materials for grooming. Pt donning pull over shirt and requiring rest break secondary to fatigue. Pt engaged in B UE strengthening exercises with use of 4 lbs resistive dowel rod for bicep curls, forward rows, reverse rows, and shoulder elevation 2 sets of 10 reps. Pt requiring rest break with each set and attempting pursed lip breathing throughout all therapeutic exercise. Pt remained in wheelchair at end of session with call bell and all needed items within reach upon exiting the room.   Therapy Documentation Precautions:  Precautions Precautions: Fall Precaution Comments: supplemental oxygen Restrictions Weight Bearing Restrictions: No Pain: Pain Assessment Pain Assessment: No/denies pain Pain Score: 0-No pain ADL: ADL ADL Comments: see functional navigator  See Function Navigator for Current Functional Status.   Therapy/Group: Individual Therapy  Gypsy Decant 03/27/2017,  11:43 AM

## 2017-03-27 NOTE — Progress Notes (Signed)
[place] PHYSICAL MEDICINE & REHABILITATION     PROGRESS NOTE    Subjective/Complaints: Trying CPAP during the day. Seems to struggle with it more at night  ROS: Patient denies fever, rash, sore throat, blurred vision, nausea, vomiting, diarrhea, cough, shortness of breath or chest pain, joint or back pain, headache, or mood change.    Objective: Vital Signs: Blood pressure 100/60, pulse 82, temperature 97.8 F (36.6 C), temperature source Oral, resp. rate 18, height 6\' 1"  (1.854 m), weight (!) 140.6 kg (310 lb), SpO2 97 %. No results found. No results for input(s): WBC, HGB, HCT, PLT in the last 72 hours. Recent Labs    03/27/17 0412  NA 134*  K 4.2  CL 98*  GLUCOSE 114*  BUN 22*  CREATININE 0.81  CALCIUM 8.9   CBG (last 3)  Recent Labs    03/26/17 1649 03/26/17 2118 03/27/17 0641  GLUCAP 101* 146* 101*    Wt Readings from Last 3 Encounters:  03/25/17 (!) 140.6 kg (310 lb)  03/22/17 132.2 kg (291 lb 7.2 oz)  11/07/16 16.5 kg (36 lb 6.4 oz)    Physical Exam:  Constitutional: No distress . Vital signs reviewed. obese HEENT: EOMI, oral membranes moist Cardiovascular: RRR without murmur. No JVD    Respiratory: CTA Bilaterally without wheezes or rales. Normal effort    GI: BS +, non-tender, non-distended   Musculoskeletal: He exhibits no edema or tenderness.  BLE with venous statis changes  and 1+ pedal edema. RLE remains wrapped in UNNA. LLE with trace edema--stable Neurological: He is alert and oriented to person, place, and time. No cranial nerve deficit.  Motor: UE: 4/5 prox to distal. senses pain and light touch in all 4's--stable Musc: pain along right greater troch. + cross legged maneuver-  Skin: Skin is warm and dry.  Sacral wound about 0.5-1" with mild fibronrecrotic tissue at center, shallow and closing--  Psychiatric: Pleasant and appropriate    Assessment/Plan: 1. Functional and mobility deficits secondary to debility which require 3+ hours per  day of interdisciplinary therapy in a comprehensive inpatient rehab setting. Physiatrist is providing close team supervision and 24 hour management of active medical problems listed below. Physiatrist and rehab team continue to assess barriers to discharge/monitor patient progress toward functional and medical goals.  Function:  Bathing Bathing position   Position: Wheelchair/chair at sink  Bathing parts Body parts bathed by patient: Right arm, Left arm, Chest, Abdomen, Front perineal area, Right upper leg, Left upper leg, Buttocks Body parts bathed by helper: Left lower leg, Back, Right lower leg  Bathing assist Assist Level: Touching or steadying assistance(Pt > 75%)      Upper Body Dressing/Undressing Upper body dressing   What is the patient wearing?: Pull over shirt/dress     Pull over shirt/dress - Perfomed by patient: Thread/unthread right sleeve, Thread/unthread left sleeve, Put head through opening, Pull shirt over trunk          Upper body assist Assist Level: Supervision or verbal cues      Lower Body Dressing/Undressing Lower body dressing   What is the patient wearing?: Pants, Non-skid slipper socks     Pants- Performed by patient: Thread/unthread right pants leg, Pull pants up/down, Thread/unthread left pants leg Pants- Performed by helper: Thread/unthread left pants leg   Non-skid slipper socks- Performed by helper: Don/doff right sock, Don/doff left sock                  Lower body assist Assist for  lower body dressing: Touching or steadying assistance (Pt > 75%)      Toileting Toileting   Toileting steps completed by patient: Adjust clothing prior to toileting, Performs perineal hygiene, Adjust clothing after toileting   Toileting Assistive Devices: Grab bar or rail  Toileting assist Assist level: Supervision or verbal cues   Transfers Chair/bed transfer Chair/bed transfer activity did not occur: Safety/medical concerns Chair/bed transfer  method: Stand pivot Chair/bed transfer assist level: Touching or steadying assistance (Pt > 75%) Chair/bed transfer assistive device: Medical sales representative Ambulation activity did not occur: Safety/medical concerns   Max distance: 10' Assist level: Touching or steadying assistance (Pt > 75%)   Wheelchair Wheelchair activity did not occur: Safety/medical concerns Type: Manual Max wheelchair distance: 150 ft Assist Level: Supervision or verbal cues  Cognition Comprehension Comprehension assist level: Follows complex conversation/direction with no assist  Expression Expression assist level: Expresses complex ideas: With no assist  Social Interaction Social Interaction assist level: Interacts appropriately with others with medication or extra time (anti-anxiety, antidepressant).  Problem Solving Problem solving assist level: Solves basic problems with no assist  Memory Memory assist level: Recognizes or recalls 75 - 89% of the time/requires cueing 10 - 24% of the time   Medical Problem List and Plan: 1.  Generalized weakness, limitation in self-care secondary to debility.   -continue therapies.  PT, OT 2.  New onset A fib/DVT Prophylaxis/Anticoagulation: Pharmaceutical: Other (comment)--Eliquis 3. Pain Management: tylenol prn   -mild right greater troch bursitis, tight TFL    -Continue ice to hip, stretching exercises, can work with PT also 4. Anxiety disorder/Mood: Continue Zoloft with ativan prn. Team to provide ego support. LCSW to follow for evaluation and support. Appears in good spirits today 5. Neuropsych: This patient is capable of making decisions on his own behalf. 6. Skin/Wound Care: Santyl with wet to dry dressing to areas of breakdown on sacrum.    - Encourage pressure relief measures.    -continue to push fluids 7. Fluids/Electrolytes/Nutrition: I personally reviewed the patient's labs today.      -encourage appropriate po intake. 8. OSA?Acute hypoxic RF:  Continue albuterol nbes tid. Oxygen with activity and CPAP at nights. Sleep study post discharge.     -continue acclimation to CPAP pressure/duration    -does better with CPAP during the day 9. Acute systolic CHF: Question viral etiology. Monitor weights daily.    -Continue Coreg bid, Lasix bid,   Lipitor and baby ASA.    -BP's remain pretty soft, continue to hold Cozaar 25 and norvasc 5mg  daily for now [facility] Weights   03/23/17 1825 03/24/17 0500 03/25/17 0410  Weight: (!) 137.6 kg (303 lb 4.8 oz) (!) 143 kg (315 lb 4.8 oz) (!) 140.6 kg (310 lb)     -Weights showing an upward trend but inconsistent 10. Morbid Obesity: Encourage weight loss to promote health, respiratory status and mobility 11. Hypokalemia: Managed with supplement. wnl 12. New diagnosis T2DM: HGB A1C- 6.3. Monitor BS ac/hs with SSI for elevated BS.    -reasonable control at present 3/18 13. Follicular lymphoma: In remission.  14. Chronic BLE edema with venous stasis ulcer: UNNA boot in place LLE since 3/13   - Resumed TEDs.   - Protein/vitamin supplement.  15.  Dysuria with hesitancy:     - urine cx with 100k GNR--- Klebsiella pneumoniae and Proteus mirabilis    -Proteus is sensitive to Bactrim.  Klebsiella ESBL. Spoke with pharmacy yesterday given limited therapeutic options. Will not  treat klebsiella and observe for any signs/symptoms of infection.         LOS (Days) [place] EVALUATION WAS PERFORMED  Meredith Staggers, MD 03/27/2017 8:54 AM

## 2017-03-27 NOTE — Progress Notes (Signed)
Speech Language Pathology Daily Session Note  Patient Details  Name: [name] MRN: [id number] Date of Birth: 02-24-1955  Today's Date: 03/27/2017 SLP Individual Time: 0730-0830 SLP Individual Time Calculation (min): 60 min  Short Term Goals: Week 1: SLP Short Term Goal 1 (Week 1): Pt will consume current diet without overt s/s of aspiration and demonstrate use of compensatory swallowing strategies with Mod I.  SLP Short Term Goal 2 (Week 1): Pt will consume trials of regular textures with efficient mastication, complete oral clearance, and minimal overt s/s of aspiration with Supervision verbal cues over two sessions prior to upgrade. SLP Short Term Goal 3 (Week 1): Pt will self monitor and correct errors during functional tasks with Supervision verbal cues.  SLP Short Term Goal 4 (Week 1): Pt will complete complex problem solving tasks with Supervision verbal cues.  SLP Short Term Goal 5 (Week 1): Pt will utilize external memory aides to recall new daily information with Mod I.  Skilled Therapeutic Interventions: Skilled treatment session focused on dysphagia and cognition goals. SLP received pt eating breakfast tray. Pt able to implement compensatory swallow strategies with intermittent supervision cues. No overt s/s of aspiration with direct PO intake. Pt with cough ~ 20 minutes after eating but will not attribute to PO intake. SLP also facilitated session by providing supervision cues to achieve ~ 80% accuracy when answering questions from auditory only information. Pt was left upright in bed with sister present and all needs within reach. Continue per current plan of care.      Function:  Eating Eating   Modified Consistency Diet: No Eating Assist Level: Supervision or verbal cues           Cognition Comprehension Comprehension assist level: Follows complex conversation/direction with extra time/assistive device  Expression   Expression assist level: Expresses complex ideas:  With extra time/assistive device  Social Interaction Social Interaction assist level: Interacts appropriately with others with medication or extra time (anti-anxiety, antidepressant).  Problem Solving Problem solving assist level: Solves complex 90% of the time/cues < 10% of the time;Solves basic problems with no assist  Memory Memory assist level: Recognizes or recalls 75 - 89% of the time/requires cueing 10 - 24% of the time;Recognizes or recalls 90% of the time/requires cueing < 10% of the time    Pain    Therapy/Group: Individual Therapy  [name] 03/27/2017, 10:06 AM

## 2017-03-27 NOTE — Progress Notes (Signed)
Pt refused cpap tonight. States hes been trying it during the day to get used to it. No distress noted. Resting comfortably on 3L [place]. Will continue to monitor.

## 2017-03-28 ENCOUNTER — Inpatient Hospital Stay (HOSPITAL_COMMUNITY): Payer: BLUE CROSS/BLUE SHIELD

## 2017-03-28 ENCOUNTER — Inpatient Hospital Stay (HOSPITAL_COMMUNITY): Payer: BLUE CROSS/BLUE SHIELD | Admitting: Occupational Therapy

## 2017-03-28 ENCOUNTER — Inpatient Hospital Stay (HOSPITAL_COMMUNITY): Payer: BLUE CROSS/BLUE SHIELD | Admitting: Speech Pathology

## 2017-03-28 ENCOUNTER — Inpatient Hospital Stay (HOSPITAL_COMMUNITY): Payer: BLUE CROSS/BLUE SHIELD | Admitting: Physical Therapy

## 2017-03-28 LAB — GLUCOSE, CAPILLARY
GLUCOSE-CAPILLARY: 112 mg/dL — AB (ref 65–99)
GLUCOSE-CAPILLARY: 104 mg/dL — AB (ref 65–99)
GLUCOSE-CAPILLARY: 113 mg/dL — AB (ref 65–99)
GLUCOSE-CAPILLARY: 124 mg/dL — AB (ref 65–99)

## 2017-03-28 NOTE — Progress Notes (Signed)
Occupational Therapy Session Note  Patient Details  Name: [name] MRN: [id number] Date of Birth: Aug 03, 1955  Today's Date: 03/28/2017 OT Individual Time: 1130-1200 OT Individual Time Calculation (min): 30 min    Short Term Goals: Week 1:  OT Short Term Goal 1 (Week 1): Pt will complete all toileting tasks with CGA OT Short Term Goal 2 (Week 1): Pt will don socks with CGA and no more than 2 rest breaks OT Short Term Goal 3 (Week 1): Pt will stand at sink during oral care for 2 minutes with no rest break OT Short Term Goal 4 (Week 1): Pt will navigate around room with RW safely to obtain clothing  Skilled Therapeutic Interventions/Progress Updates:    Pt received in therapy gym from PT. Pt completed 4 sets of standing level functional reaching task, with vc provided re weight shifting, for breathing cues as well as intermittent manual cues for stabilization when reaching outside of BOS. Pt required extended seated rest breaks following <1 minute of standing with reaching d/t SOB and fatigue. Pt then participated in standing level table top task intended to simulate pt's work environment. Vc provided for reduced reliance on UE support on RW during standing. Pt able to remain standing for ~5 minutes without a seated rest break. Skilled monitoring of vitals throughout session, with no de-sat observed with pt on 2L [place]. Extensive education provided re ECT, OT POC, and pt's condition.  Pt returned to room with chair alarm activated and all needs met.   Therapy Documentation Precautions:  Precautions Precautions: Fall Precaution Comments: supplemental oxygen Restrictions Weight Bearing Restrictions: No   ADL: ADL ADL Comments: see functional navigator  See Function Navigator for Current Functional Status.   Therapy/Group: Individual Therapy  Curtis Sites 03/28/2017, 2:23 PM

## 2017-03-28 NOTE — Progress Notes (Signed)
[place] PHYSICAL MEDICINE & REHABILITATION     PROGRESS NOTE    Subjective/Complaints: Continues to progress from a physical standpoint. Asked about unna and when it will come off  ROS: Patient denies fever, rash, sore throat, blurred vision, nausea, vomiting, diarrhea, cough, shortness of breath or chest pain, joint or back pain, headache, or mood change.   Objective: Vital Signs: Blood pressure (!) 151/71, pulse 77, temperature 98 F (36.7 C), temperature source Oral, resp. rate 18, height 6\' 1"  (1.854 m), weight (!) 140.6 kg (310 lb), SpO2 95 %. No results found. No results for input(s): WBC, HGB, HCT, PLT in the last 72 hours. Recent Labs    03/27/17 0412  NA 134*  K 4.2  CL 98*  GLUCOSE 114*  BUN 22*  CREATININE 0.81  CALCIUM 8.9   CBG (last 3)  Recent Labs    03/27/17 1631 03/27/17 2048 03/28/17 0632  GLUCAP 120* 130* 112*    Wt Readings from Last 3 Encounters:  03/25/17 (!) 140.6 kg (310 lb)  03/22/17 132.2 kg (291 lb 7.2 oz)  11/07/16 16.5 kg (36 lb 6.4 oz)    Physical Exam:  Constitutional: No distress . Obese Vital signs reviewed. HEENT: EOMI, oral membranes moist Cardiovascular: RRR without murmur. No JVD    Respiratory: CTA Bilaterally without wheezes or rales. Normal effort    GI: BS +, non-tender, non-distended   Musculoskeletal: He exhibits no edema or tenderness.   RLE remains wrapped in UNNA. LLE with trace edema--stable Neurological: He is alert and oriented to person, place, and time. No cranial nerve deficit. Reasonable insight and awareness Motor: UE: 4/5 prox to distal. senses pain and light touch in all 4's--stable Musc: pain along right greater troch Skin: Skin is warm and dry.  Sacral wound about 0.5-1" with mild fibronrecrotic tissue at center--stable Psychiatric: Pleasant and appropriate    Assessment/Plan: 1. Functional and mobility deficits secondary to debility which require 3+ hours per day of interdisciplinary therapy in  a comprehensive inpatient rehab setting. Physiatrist is providing close team supervision and 24 hour management of active medical problems listed below. Physiatrist and rehab team continue to assess barriers to discharge/monitor patient progress toward functional and medical goals.  Function:  Bathing Bathing position   Position: Other (comment)(UB at sink and LB at commode)  Bathing parts Body parts bathed by patient: Right arm, Left arm, Chest, Abdomen, Front perineal area, Right upper leg, Left upper leg, Buttocks Body parts bathed by helper: Left lower leg  Bathing assist Assist Level: Touching or steadying assistance(Pt > 75%)      Upper Body Dressing/Undressing Upper body dressing   What is the patient wearing?: Pull over shirt/dress     Pull over shirt/dress - Perfomed by patient: Thread/unthread right sleeve, Thread/unthread left sleeve, Put head through opening, Pull shirt over trunk          Upper body assist Assist Level: Supervision or verbal cues, Set up   Set up : To obtain clothing/put away  Lower Body Dressing/Undressing Lower body dressing   What is the patient wearing?: Pants, Non-skid slipper socks     Pants- Performed by patient: Thread/unthread right pants leg, Pull pants up/down, Thread/unthread left pants leg Pants- Performed by helper: Thread/unthread left pants leg   Non-skid slipper socks- Performed by helper: Don/doff right sock, Don/doff left sock                  Lower body assist Assist for lower body dressing: Touching  or steadying assistance (Pt > 75%)      Toileting Toileting   Toileting steps completed by patient: Adjust clothing prior to toileting, Performs perineal hygiene, Adjust clothing after toileting Toileting steps completed by helper: Performs perineal hygiene, Adjust clothing after toileting(per Candice Applewhite, NT) Toileting Assistive Devices: Grab bar or rail  Toileting assist Assist level: Supervision or verbal  cues   Transfers Chair/bed transfer Chair/bed transfer activity did not occur: Safety/medical concerns Chair/bed transfer method: Stand pivot Chair/bed transfer assist level: Supervision or verbal cues Chair/bed transfer assistive device: Armrests, Bedrails     Locomotion Ambulation Ambulation activity did not occur: Safety/medical concerns   Max distance: 45 Assist level: Touching or steadying assistance (Pt > 75%)   Wheelchair Wheelchair activity did not occur: Safety/medical concerns Type: Manual Max wheelchair distance: 150 ft Assist Level: Supervision or verbal cues  Cognition Comprehension Comprehension assist level: Follows complex conversation/direction with extra time/assistive device  Expression Expression assist level: Expresses complex ideas: With extra time/assistive device  Social Interaction Social Interaction assist level: Interacts appropriately with others with medication or extra time (anti-anxiety, antidepressant).  Problem Solving Problem solving assist level: Solves complex 90% of the time/cues < 10% of the time, Solves basic problems with no assist  Memory Memory assist level: Recognizes or recalls 75 - 89% of the time/requires cueing 10 - 24% of the time, Recognizes or recalls 90% of the time/requires cueing < 10% of the time   Medical Problem List and Plan: 1.  Generalized weakness, limitation in self-care secondary to debility.   -continue therapies.  PT, OT, SLP   -team conference today 2.  New onset A fib/DVT Prophylaxis/Anticoagulation: Pharmaceutical: Other (comment)--Eliquis 3. Pain Management: tylenol prn   -mild right greater troch bursitis, tight TFL    -Continue ice to hip, stretching exercises, can work with PT also 4. Anxiety disorder/Mood: Continue Zoloft with ativan prn. Team to provide ego support. LCSW to follow for evaluation and support. Appears in good spirits today 5. Neuropsych: This patient is capable of making decisions on his own  behalf. 6. Skin/Wound Care: Santyl with wet to dry dressing to areas of breakdown on sacrum.    - Encourage pressure relief measures.    -continue to push fluids 7. Fluids/Electrolytes/Nutrition: I personally reviewed the patient's labs today.      -encourage appropriate po intake. 8. OSA?Acute hypoxic RF: Continue albuterol nbes tid. Oxygen with activity and CPAP at nights. Sleep study post discharge.     -continue acclimation to CPAP pressure/duration    -trials of CPAP during the day 9. Acute systolic CHF: Question viral etiology. Monitor weights daily.    -Continue Coreg bid, Lasix bid,   Lipitor and baby ASA.    -BP's have been low before this morning. continue to hold Cozaar 25 and norvasc 5mg  daily for now but consider resuming if trend continues Filed Weights   03/23/17 1825 03/24/17 0500 03/25/17 0410  Weight: (!) 137.6 kg (303 lb 4.8 oz) (!) 143 kg (315 lb 4.8 oz) (!) 140.6 kg (310 lb)     -need to check daily weights 10. Morbid Obesity: Encourage weight loss to promote health, respiratory status and mobility 11. Hypokalemia: Managed with supplement. wnl 12. New diagnosis T2DM: HGB A1C- 6.3. Monitor BS ac/hs with SSI for elevated BS.    -reasonable control at present 3/19 13. Follicular lymphoma: In remission.  14. Chronic BLE edema with venous stasis ulcer: UNNA boot in place LLE since 3/13--remove tomorrow and observe   - Resumed  TEDs.   - Protein/vitamin supplement.  15.  Dysuria with hesitancy:     - urine cx with 100k GNR--- Klebsiella pneumoniae and Proteus mirabilis    -Proteus is sensitive to Bactrim.  Klebsiella ESBL. Spoke with pharmacy yesterday given limited therapeutic options. Will not treat klebsiella and observe for any signs/symptoms of infection. Remains afebrile, bladder emptying        LOS (Days) 6 A FACE TO FACE EVALUATION WAS PERFORMED  Meredith Staggers, MD 03/28/2017 9:20 AM

## 2017-03-28 NOTE — Progress Notes (Signed)
Physical Therapy Session Note  Patient Details  Name: [name] MRN: [id number] Date of Birth: 1955-06-27  Today's Date: 03/28/2017 PT Individual Time: 7939-0300 PT Individual Time Calculation (min): 57 min   Short Term Goals: Week 1:  PT Short Term Goal 1 (Week 1): Pt will initiate stair training. PT Short Term Goal 2 (Week 1): Pt will ambulate 50 ft with LRAD & min assist. PT Short Term Goal 3 (Week 1): Pt will propel w/c 50 ft for strengthening purposes.  PT Short Term Goal 4 (Week 1): Pt will complete bed<>w/c with min assist & LRAD.  Skilled Therapeutic Interventions/Progress Updates:   Pt received in w/c, agreeable to therapy. Pt O2 96% on 3L [place] and HR 84. Pt complains of generalized fatigue and 6/10 pain 2/2 sacral sore. Pt ambulated 83ft w/ RW steady assist, verbal cuing to maintain RW within BOS. Pt requiring rest break, O2 sat 95% and HR 100. Therapist decided to wean O2 to 2L and monitor sats, RN notified. Pt ambulated 28 ft, requiring another break. O2 sat 94% on 2L [place]. Therapist propelled pt total A rest of way to gym for time management and energy conservation. Pt performed therex seated on mat: 2x10 bilat LAQ 1.5#, hip ADD with ball x30 requiring verbal cuing for proper form. O2 after activity on 2L was 92%. Pt educated on pursed-lip breathing and demonstration of technique back to therapist improved O2 sats. Pt performed 1x10 bilat Hip ABD standing at parallel bars, reporting 10/10 difficulty level and 8/10 fatigue level. Pt required verbal cuing to maintain upright posture and not lean on bars for support. Pt educated on not leaning on objects, such as O2 tank, when transferring due to instability. Therapist propelled pt back to room, pt transferred chair<> bed stand pivot with steady assist and use of bedrails. Pt O2 at end of session 94% on 2 L [place], O2 on wall adjusted to 2L. Pt left in bed, friend present and all needs within reach.     Therapy Documentation Precautions:   Precautions Precautions: Fall Precaution Comments: supplemental oxygen Restrictions Weight Bearing Restrictions: No   See Function Navigator for Current Functional Status.   Therapy/Group: Individual Therapy  Caffie Damme 03/28/2017, 12:26 PM

## 2017-03-28 NOTE — Progress Notes (Signed)
Physical Therapy Session Note  Patient Details  Name: [name] MRN: [id number] Date of Birth: 10/20/1955  Today's Date: 03/28/2017 PT Individual Time: 1346-1410 PT Individual Time Calculation (min): 24 min   Skilled Therapeutic Interventions/Progress Updates:    Session focused on functional gait training with RW with focus on technique and overall endurance with cues for deep breathing (sats remained > 90% with 2L O2) x 3 trials of about 30-40' each time with close supervision to steadying assist and verbal cues for upright posture, positioning of RW, and decreased reliance on UE's. Transferred with steadying assist back to bed end of session and positioned in supine with supervision for cues for improved technique.  Therapy Documentation Precautions:  Precautions Precautions: Fall Precaution Comments: supplemental oxygen Restrictions Weight Bearing Restrictions: No   Pain:  Reports pain on bottom from sitting. Focused on upright mobility for pain relief.   See Function Navigator for Current Functional Status.   Therapy/Group: Individual Therapy  Canary Brim Ivory Broad, PT, DPT  03/28/2017, 2:36 PM

## 2017-03-28 NOTE — Consult Note (Addendum)
[place] Nurse wound follow up Reason for Consult:Re-assessment ofsacrum and buttocks. Refer to previous [facility] notes. Wounds are slowly decreasing in size. Wound type:2 unstageable pressure injuries .5X.5cm to buttocks and sacrum:  1X.2 cm, both 90% yellow slough, 10% pale pink, small amt tan drainage Pressure Injury POA: Present on admission to rehab ContinueSantyl ointment to provide enzymatic debridement of nonviable tissue and foam dressing to protect buttocks from further injury. Discussed plan of care with patient and wide at the bedside. Please re-consult if further assistance is needed.  Thank-you,  Julien Girt MSN, [place], [place], [place], [place]

## 2017-03-28 NOTE — Progress Notes (Signed)
Speech Language Pathology Daily Session Note  Patient Details  Name: [name] MRN: [id number] Date of Birth: 1955-02-21  Today's Date: 03/28/2017 SLP Individual Time: 2951-8841 SLP Individual Time Calculation (min): 40 min  Short Term Goals: Week 1: SLP Short Term Goal 1 (Week 1): Pt will consume current diet without overt s/s of aspiration and demonstrate use of compensatory swallowing strategies with Mod I.  SLP Short Term Goal 2 (Week 1): Pt will consume trials of regular textures with efficient mastication, complete oral clearance, and minimal overt s/s of aspiration with Supervision verbal cues over two sessions prior to upgrade. SLP Short Term Goal 3 (Week 1): Pt will self monitor and correct errors during functional tasks with Supervision verbal cues.  SLP Short Term Goal 4 (Week 1): Pt will complete complex problem solving tasks with Supervision verbal cues.  SLP Short Term Goal 5 (Week 1): Pt will utilize external memory aides to recall new daily information with Mod I.  Skilled Therapeutic Interventions: Skilled treatment session focused on cognitive goals. SLP facilitated session by providing Supervision verbal cues for problem solving during a complex medication management task. Pt required Mod A verbal cues to recall current medications and their functions and to self-monitor and correct errors throughout task. Pt handed off to PT. Continue with current plan of care.      Function:  Cognition Comprehension Comprehension assist level: Follows complex conversation/direction with extra time/assistive device  Expression   Expression assist level: Expresses complex ideas: With extra time/assistive device  Social Interaction Social Interaction assist level: Interacts appropriately with others with medication or extra time (anti-anxiety, antidepressant).  Problem Solving Problem solving assist level: Solves complex 90% of the time/cues < 10% of the time  Memory Memory assist  level: Recognizes or recalls 50 - 74% of the time/requires cueing 25 - 49% of the time    Pain Pain Assessment Pain Assessment: No/denies pain  Therapy/Group: Individual Therapy  Meredeth Ide  SLP - Student 03/28/2017, 2:39 PM

## 2017-03-28 NOTE — Progress Notes (Signed)
Recreational Therapy Assessment and Plan  Patient Details  Name: [name] MRN: [id number] Date of Birth: 03-07-1955 Today's Date: 03/28/2017  Rehab Potential: Good ELOS: 2 weeks   Assessment  Problem List:      Patient Active Problem List   Diagnosis Date Noted  . Atrial fibrillation ([place])   . Debility   . Wound infection   . OSA (obstructive sleep apnea)   . Acute systolic heart failure ([place])   . Morbid obesity ([place])   . Diabetes mellitus, new onset ([place])   . Dysuria   . Acute respiratory distress syndrome (ARDS) (HCC)   . Influenza A with pneumonia   . Acute systolic CHF (congestive heart failure) ([place])   . New onset atrial fibrillation ([place])   . Acute pulmonary edema (HCC)   . Controlled diabetes mellitus type 2 with complications ([place])   . Anxiety   . Current moderate episode of major depressive disorder without prior episode ([place])   . Obesity, Class III, BMI 40-49.9 (morbid obesity) ([place])   . Community acquired pneumonia   . Critical illness myopathy   . Dysphagia   . Persistent atrial fibrillation ([place])   . Acute systolic congestive heart failure ([place])   . Non-Hodgkin's lymphoma ([place])   . Benign essential HTN   . Leukocytosis   . Hypokalemia   . Tracheostomy status ([place])   . Hypoxic   . Pressure injury of skin 02/28/2017  . Acute respiratory failure with hypoxemia ([place])   . Status post tracheostomy ([place])   . Acute respiratory failure ([place])   . Respiratory failure, unspecified with hypoxia ([place]) 02/16/2017  . Follicular lymphoma grade II of intrathoracic lymph nodes ([place]) 05/02/2016  . Portacath in place 05/04/2015  . Gout attack 01/16/2012  . Follicular lymphoma grade II ([place]) 12/16/2010    Past Medical History:      Past Medical History:  Diagnosis Date  . Diabetes mellitus, new onset ([place])   . Dyslipidemia   . Gout attack 01/16/2012  . Hypertension   . nhl dx'd 01/2010  . Venous insufficiency   . Venous  stasis ulcers (HCC)    Past Surgical History:       Past Surgical History:  Procedure Laterality Date  . BRONCHOSCOPY    . ESOPHAGOGASTRODUODENOSCOPY ENDOSCOPY     Dr Jearld Fenton  . INGUINAL HERNIA REPAIR     2001  . PORTA CATH INSERTION  2011    Assessment & Plan Clinical Impression: Patient is a 62 y.o. year old male with history of Non- Hodgkin's lymphoma -in remission, HTN, PVD with bilateral foot wounds, morbid obesity who was admitted on 02/16/17 with ARDS due influenza A with sepsis. He was found to have cardiomyopathy with EF 25-30% and new onset of acute systolic CHF. He required intubation on 2/9 with volume overload, hypotension as well as tracheostomy on 2/14. Dr. Percival Spanish following for input on HF as well as new onset of A fib. Eliquis recommended due to CHA2DS2VASc - 4. He was weaned off vent with high oxygenation was able to tolerate plugging. Hypoxia felt to be due to mix of ALI/ARDS after flu and atelectasis d/t body habitus. He had desaturation on overnight pulse oximetry ( 4-5 episodes lasting <2 minutes with lowest reading being 83%) and PCCM recommended continuing trach--plugged during the day and open at night but patient hates trach and preferred to have it removed therefore underwent decannulation today. Overnight pulse oximetery done last night and will likely need CPAP at nights. Will  need outpatient sleep study by Dr. Halford Chessman after discharge. WOC following for input on foot wounds as well as MASD on sacrum/buttocks. He continues to have bouts of anxiety secondary to DOE and -needs 4-6 liters per [place]. Activity levels improving and CIR recommended due to debility. Patient transferred to CIR on 03/22/2017 .   Pt presents with decreased activity tolerance, decreased oxygen support, decreased functional mobility, decreased balance, decreased memory Limiting pt's independence with leisure/community pursuits.  Leisure History/Participation Premorbid leisure  interest/current participation: Community - Building control surveyor - Travel (Comment);Community - Designer, jewellery Expression Interests: Music (Comment);Play instrument (Comment)(piano, organ, plays at church) Other Leisure Interests: Cooking/Baking Leisure Participation Style: With Family/Friends Awareness of Community Resources: Good-identify 3 post discharge leisure resources Psychosocial / Spiritual Spiritual Interests: Church Does patient have pets?: No Social interaction - Mood/Behavior: Cooperative Engineer, drilling for Education?: Yes Recreational Therapy Orientation Orientation -Reviewed with patient: Available activity resources Strengths/Weaknesses Patient Strengths/Abilities: Willingness to participate;Active premorbidly Patient weaknesses: Physical limitations TR Patient demonstrates impairments in the following area(s): Edema;Endurance;Motor;Pain;Skin Integrity  Plan Rec Therapy Plan Is patient appropriate for Therapeutic Recreation?: Yes Rehab Potential: Good Treatment times per week: Min 1 TR session/group >20 mintues during LOS Estimated Length of Stay: 2 weeks TR Treatment/Interventions: Adaptive equipment instruction;Cognitive remediation/compensation;Group participation (Comment);Therapeutic exercise;Wheelchair propulsion/positioning;1:1 session;Community reintegration;Recreation/leisure participation;UE/LE Coordination activities;Leisure education;Balance/vestibular training;Functional mobility training;Patient/family education;Therapeutic activities  Recommendations for other services: None   Discharge Criteria: Patient will be discharged from TR if patient refuses treatment 3 consecutive times without medical reason.  If treatment goals not met, if there is a change in medical status, if patient makes no progress towards goals or if patient is discharged from hospital.  The above assessment, treatment plan, treatment alternatives and goals were  discussed and mutually agreed upon: by patient  [place] 03/28/2017, 3:40 PM

## 2017-03-28 NOTE — Progress Notes (Signed)
Physical Therapy Session Note  Patient Details  Name: [name] MRN: [id number] Date of Birth: Jan 24, 1955  Today's Date: 03/28/2017 PT Individual Time: 1100-1130 PT Individual Time Calculation (min): 30 min   Short Term Goals: Week 1:  PT Short Term Goal 1 (Week 1): Pt will initiate stair training. PT Short Term Goal 2 (Week 1): Pt will ambulate 50 ft with LRAD & min assist. PT Short Term Goal 3 (Week 1): Pt will propel w/c 50 ft for strengthening purposes.  PT Short Term Goal 4 (Week 1): Pt will complete bed<>w/c with min assist & LRAD.  Skilled Therapeutic Interventions/Progress Updates:    Session focused on functional transfers, overall endurance, and UE strengthening. Pt able to come to EOB with supervision using hospital bed features. Steadying assist for sit <> stands with RW and transfer into w/c with cues for hand placement and safety. Pt request to work on UE strengthening due to just finishing with PT and focusing on LE's. With 5# straight weight, instructed in seated bicep curls, chest press, and "luggage lifts" for scapular activation x 15 reps each with cues for deep breathing and technique. Handoff to OT.  Therapy Documentation Precautions:  Precautions Precautions: Fall Precaution Comments: supplemental oxygen Restrictions Weight Bearing Restrictions: No  Pain:  Discomfort on his bottom. Repositioned throughout.   See Function Navigator for Current Functional Status.   Therapy/Group: Individual Therapy  Canary Brim Ivory Broad, PT, DPT  03/28/2017, 1:34 PM

## 2017-03-29 ENCOUNTER — Inpatient Hospital Stay (HOSPITAL_COMMUNITY): Payer: BLUE CROSS/BLUE SHIELD | Admitting: Speech Pathology

## 2017-03-29 ENCOUNTER — Other Ambulatory Visit: Payer: Self-pay

## 2017-03-29 ENCOUNTER — Inpatient Hospital Stay (HOSPITAL_COMMUNITY): Payer: BLUE CROSS/BLUE SHIELD

## 2017-03-29 ENCOUNTER — Inpatient Hospital Stay (HOSPITAL_COMMUNITY): Payer: BLUE CROSS/BLUE SHIELD | Admitting: Physical Therapy

## 2017-03-29 ENCOUNTER — Inpatient Hospital Stay (HOSPITAL_COMMUNITY): Payer: BLUE CROSS/BLUE SHIELD | Admitting: Occupational Therapy

## 2017-03-29 ENCOUNTER — Encounter (HOSPITAL_COMMUNITY): Payer: Self-pay

## 2017-03-29 LAB — GLUCOSE, CAPILLARY
GLUCOSE-CAPILLARY: 110 mg/dL — AB (ref 65–99)
GLUCOSE-CAPILLARY: 109 mg/dL — AB (ref 65–99)
GLUCOSE-CAPILLARY: 113 mg/dL — AB (ref 65–99)
Glucose-Capillary: 155 mg/dL — ABNORMAL HIGH (ref 65–99)

## 2017-03-29 MED ORDER — LIDOCAINE HCL 2 % EX GEL
1.0000 "application " | CUTANEOUS | Status: DC | PRN
  Filled 2017-03-29: qty 5

## 2017-03-29 NOTE — Patient Care Conference (Signed)
Inpatient RehabilitationTeam Conference and Plan of Care Update Date: 03/28/2017   Time: 2:35 PM    Patient Name: [name]      Medical Record Number: [id number]  Date of Birth: [birth year] Sex: Male         Room/Bed: 4W06C/4W06C-01 Payor Info: Payor: [place] / Plan: BCBS OTHER / Product Type: *No Product type* /    Admitting Diagnosis: CIM  Admit Date/Time:  03/22/2017  3:32 PM Admission Comments: No comment available   Primary Diagnosis:  <principal problem not specified> Principal Problem: <principal problem not specified>  Patient Active Problem List   Diagnosis Date Noted  . Atrial fibrillation ([place])   . Debility   . Wound infection   . OSA (obstructive sleep apnea)   . Acute systolic heart failure ([place])   . Morbid obesity ([place])   . Diabetes mellitus, new onset ([place])   . Dysuria   . Acute respiratory distress syndrome (ARDS) (HCC)   . Influenza A with pneumonia   . Acute systolic CHF (congestive heart failure) ([place])   . New onset atrial fibrillation ([place])   . Acute pulmonary edema (HCC)   . Controlled diabetes mellitus type 2 with complications ([place])   . Anxiety   . Current moderate episode of major depressive disorder without prior episode ([place])   . Obesity, Class III, BMI 40-49.9 (morbid obesity) ([place])   . Community acquired pneumonia   . Critical illness myopathy   . Dysphagia   . Persistent atrial fibrillation ([place])   . Acute systolic congestive heart failure ([place])   . Non-Hodgkin's lymphoma ([place])   . Benign essential HTN   . Leukocytosis   . Hypokalemia   . Tracheostomy status ([place])   . Hypoxic   . Pressure injury of skin 02/28/2017  . Acute respiratory failure with hypoxemia ([place])   . Status post tracheostomy ([place])   . Acute respiratory failure ([place])   . Respiratory failure, unspecified with hypoxia ([place]) 02/16/2017  . Follicular lymphoma grade II of intrathoracic lymph nodes ([place]) 05/02/2016  . Portacath in place 05/04/2015  . Gout  attack 01/16/2012  . Follicular lymphoma grade II ([place]) 12/16/2010    Expected Discharge Date: Expected Discharge Date: 04/06/17  Team Members Present: Physician leading conference: Dr. Alger Simons Social Worker Present: Lennart Pall, LCSW Nurse Present: Leonette Nutting, RN PT Present: Roderic Ovens, PT OT Present: Willeen Cass, OT SLP Present: Weston Anna, SLP PPS Coordinator present : Daiva Nakayama, RN, CRRN     Current Status/Progress Goal Weekly Team Focus  Medical   Debility after multiple medical issues.  Improving respiratory status.  edema management with [employer].  UTI being treated with antibiotics  Improve activity tolerance  See above   Bowel/Bladder             Swallow/Nutrition/ Hydration   Regular textures with thin liquids, Intermittent supervision for use of swallowing strategies   Mod I  increase use of swallowing strategies.    ADL's   S/set up grooming and UB self care and toileting with min A for LB self care, balance, and other functional transfers. Pt with decreased activity tolerance.  mod I overall, S for shower transfer, min A meal prep  ADL training, endurance, strengthening, balance, functional transfers/mobility, pt/family edu   Mobility   min assist ambulation 45 ft max at one time with RW, on 3L/min supplemental oxygen, limited by fatigue/poor endurance, min assist transfers, supervision bed mobility with bed features.  supervision overall  endurance, activity tolerance, strengthening, transfers, gait, pt education   Communication             Safety/Cognition/ Behavioral Observations  Supervision-Min A  Mod I  complex problem solving, recall    Pain             Skin              Rehab Goals Patient on target to meet rehab goals: Yes *See Care Plan and progress notes for long and short-term goals.     Barriers to Discharge  Current Status/Progress Possible Resolutions Date Resolved   Physician    Medical stability                Nursing                  PT                    OT                  SLP                SW                Discharge Planning/Teaching Needs:  Pt will d/c to his home and sister + friends will coordinate 24/7 assistance.  Teaching need TBD.   Team Discussion:  Debility;  Improved cognition overall.  Wounds improving but still need santyl on bottom.  Continue to work on CPAP management.  Tolerating reg diet.  Min assist overall with mobility;  Ambulating short distance due to poor breath support but it is improving.  MD notes some possibilty pt might d/c off of O2.  Revisions to Treatment Plan:  none    Continued Need for Acute Rehabilitation Level of Care: The patient requires daily medical management by a physician with specialized training in physical medicine and rehabilitation for the following conditions: Daily direction of a multidisciplinary physical rehabilitation program to ensure safe treatment while eliciting the highest outcome that is of practical value to the patient.: Yes Daily medical management of patient stability for increased activity during participation in an intensive rehabilitation regime.: Yes Daily analysis of laboratory values and/or radiology reports with any subsequent need for medication adjustment of medical intervention for : Wound care problems;Blood pressure problems;Nutritional problems  Retal Tonkinson 03/29/2017, 12:05 PM

## 2017-03-29 NOTE — Progress Notes (Signed)
Visited pt room to check to see if he was ready for CPAP patient asleep now.  Has been refusing off and on.  No distress noted.  Spoke with RN advised if he wakes up and wants to wear to call RT and I will place pt on.

## 2017-03-29 NOTE — Progress Notes (Signed)
Speech Language Pathology Daily Session Note  Patient Details  Name: [name] MRN: [id number] Date of Birth: Sep 11, 1955  Today's Date: 03/29/2017 SLP Individual Time: 1030-1100 SLP Individual Time Calculation (min): 30 min  Short Term Goals: Week 1: SLP Short Term Goal 1 (Week 1): Pt will consume current diet without overt s/s of aspiration and demonstrate use of compensatory swallowing strategies with Mod I.  SLP Short Term Goal 2 (Week 1): Pt will consume trials of regular textures with efficient mastication, complete oral clearance, and minimal overt s/s of aspiration with Supervision verbal cues over two sessions prior to upgrade. SLP Short Term Goal 3 (Week 1): Pt will self monitor and correct errors during functional tasks with Supervision verbal cues.  SLP Short Term Goal 4 (Week 1): Pt will complete complex problem solving tasks with Supervision verbal cues.  SLP Short Term Goal 5 (Week 1): Pt will utilize external memory aides to recall new daily information with Mod I.  Skilled Therapeutic Interventions: Skilled treatment session focused on cognitive goals. SLP facilitated session by providing Supervision verbal cues for pt to self-monitor and correct errors during a novel, complex task (Call-It). Pt demonstrated complex problem solving and recall of new information with Mod I throughout session. Pt handed off to PT. Continue with current plan of care.      Function:  Cognition Comprehension Comprehension assist level: Follows complex conversation/direction with extra time/assistive device  Expression   Expression assist level: Expresses complex ideas: With extra time/assistive device  Social Interaction Social Interaction assist level: Interacts appropriately with others with medication or extra time (anti-anxiety, antidepressant).  Problem Solving Problem solving assist level: Solves complex problems: With extra time  Memory Memory assist level: More than reasonable amount  of time    Pain Pain Assessment Pain Assessment: No/denies pain   Therapy/Group: Individual Therapy  Meredeth Ide  SLP - Student 03/29/2017, 12:11 PM

## 2017-03-29 NOTE — Progress Notes (Signed)
Patient able to place CPAP on self.  Advised to have RT called should he have any problem.

## 2017-03-29 NOTE — Progress Notes (Signed)
Occupational Therapy Session Note  Patient Details  Name: [name] MRN: [id number] Date of Birth: March 02, 1955  Today's Date: 03/29/2017 OT Individual Time: 0228-4069 OT Individual Time Calculation (min): 32 min    Short Term Goals: Week 1:  OT Short Term Goal 1 (Week 1): Pt will complete all toileting tasks with CGA OT Short Term Goal 2 (Week 1): Pt will don socks with CGA and no more than 2 rest breaks OT Short Term Goal 3 (Week 1): Pt will stand at sink during oral care for 2 minutes with no rest break OT Short Term Goal 4 (Week 1): Pt will navigate around room with RW safely to obtain clothing  Skilled Therapeutic Interventions/Progress Updates:    Pt received in room supine in bed. Pt agreeable to OT session with a focus on functional activity tolerance and O2 weaning. Pt required vc for sequencing supine to sitting EOB transfer. Pt completed oral hygiene at standing level with supervision. Skilled monitoring of vitals throughout session to assess pulmonary stability during functional tasks. Pt completed 2 trials of standing level tasks with no O2. Pt able to stand for 1 minute prior to desat to 88% with vc of seated rest break. Prolonged seated rest break required d/t pt anxiety. Education provided re symptom management and fall prevention, as well as discussion re condition insight. Pt returned to supine in bed and all needs met.   Therapy Documentation Precautions:  Precautions Precautions: Fall Precaution Comments: supplemental oxygen Restrictions Weight Bearing Restrictions: No Pain: Pain Assessment Pain Assessment: No/denies pain Pain Score: 0-No pain ADL: ADL ADL Comments: see functional navigator  See Function Navigator for Current Functional Status.   Therapy/Group: Individual Therapy  Curtis Sites 03/29/2017, 3:27 PM

## 2017-03-29 NOTE — Progress Notes (Signed)
[facility] PHYSICAL MEDICINE & REHABILITATION     PROGRESS NOTE    Subjective/Complaints: Getting up to the bathroom. No new issues. Pain levels much improved. Still working on anxiety  ROS: Patient denies fever, rash, sore throat, blurred vision, nausea, vomiting, diarrhea, cough, shortness of breath or chest pain, joint or back pain, headache, or mood change.   Objective: Vital Signs: Blood pressure 102/75, pulse 86, temperature 97.7 F (36.5 C), temperature source Oral, resp. rate 19, height 6\' 1"  (1.854 m), weight (!) 139.7 kg (308 lb), SpO2 98 %. No results found. No results for input(s): WBC, HGB, HCT, PLT in the last 72 hours. Recent Labs    03/27/17 0412  NA 134*  K 4.2  CL 98*  GLUCOSE 114*  BUN 22*  CREATININE 0.81  CALCIUM 8.9   CBG (last 3)  Recent Labs    03/29/17 0654 03/29/17 1206 03/29/17 1656  GLUCAP 110* 109* 113*    Wt Readings from Last 3 Encounters:  03/29/17 (!) 139.7 kg (308 lb)  03/22/17 132.2 kg (291 lb 7.2 oz)  11/07/16 16.5 kg (36 lb 6.4 oz)    Physical Exam:  Constitutional: No distress . Vital signs reviewed. HEENT: EOMI, oral membranes moist Cardiovascular: RRR without murmur. No JVD    Respiratory: CTA Bilaterally without wheezes or rales. Normal effort    GI: BS +, non-tender, non-distended   Musculoskeletal: He exhibits no edema or tenderness.   RLE remains wrapped in UNNA. LLE with trace edema--no changes Neurological: He is alert and oriented to person, place, and time. No cranial nerve deficit. Reasonable insight and awareness Motor: UE: 4/5 prox to distal. senses pain and light touch in all 4's--stable Musc: pain along right greater troch resolved Skin: Skin is warm and dry.  Sacral wound about 0.5-1" in diameter Psychiatric: Pleasant and appropriate    Assessment/Plan: 1. Functional and mobility deficits secondary to debility which require 3+ hours per day of interdisciplinary therapy in a comprehensive inpatient rehab  setting. Physiatrist is providing close team supervision and 24 hour management of active medical problems listed below. Physiatrist and rehab team continue to assess barriers to discharge/monitor patient progress toward functional and medical goals.  Function:  Bathing Bathing position   Position: Other (comment)(UB at sink and LB at commode)  Bathing parts Body parts bathed by patient: Right arm, Left arm, Chest, Abdomen, Front perineal area, Right upper leg, Left upper leg, Buttocks Body parts bathed by helper: Left lower leg  Bathing assist Assist Level: Touching or steadying assistance(Pt > 75%)      Upper Body Dressing/Undressing Upper body dressing   What is the patient wearing?: Pull over shirt/dress     Pull over shirt/dress - Perfomed by patient: Thread/unthread right sleeve, Thread/unthread left sleeve, Put head through opening, Pull shirt over trunk          Upper body assist Assist Level: Supervision or verbal cues, Set up   Set up : To obtain clothing/put away  Lower Body Dressing/Undressing Lower body dressing   What is the patient wearing?: Pants, Non-skid slipper socks     Pants- Performed by patient: Thread/unthread right pants leg, Pull pants up/down, Thread/unthread left pants leg Pants- Performed by helper: Thread/unthread left pants leg   Non-skid slipper socks- Performed by helper: Don/doff right sock, Don/doff left sock                  Lower body assist Assist for lower body dressing: Touching or steadying assistance (Pt >  75%)      Toileting Toileting Toileting activity did not occur: No continent bowel/bladder event Toileting steps completed by patient: Adjust clothing prior to toileting, Performs perineal hygiene, Adjust clothing after toileting Toileting steps completed by helper: Performs perineal hygiene, Adjust clothing after toileting Toileting Assistive Devices: Grab bar or rail  Toileting assist Assist level: Supervision or verbal  cues   Transfers Chair/bed transfer Chair/bed transfer activity did not occur: Safety/medical concerns Chair/bed transfer method: Stand pivot Chair/bed transfer assist level: Supervision or verbal cues Chair/bed transfer assistive device: Medical sales representative Ambulation activity did not occur: Safety/medical concerns   Max distance: 55ft Assist level: Touching or steadying assistance (Pt > 75%)   Wheelchair Wheelchair activity did not occur: Safety/medical concerns Type: Manual Max wheelchair distance: 150 ft Assist Level: Supervision or verbal cues  Cognition Comprehension Comprehension assist level: Follows complex conversation/direction with extra time/assistive device  Expression Expression assist level: Expresses complex ideas: With extra time/assistive device  Social Interaction Social Interaction assist level: Interacts appropriately with others with medication or extra time (anti-anxiety, antidepressant).  Problem Solving Problem solving assist level: Solves complex problems: With extra time  Memory Memory assist level: More than reasonable amount of time   Medical Problem List and Plan: 1.  Generalized weakness, limitation in self-care secondary to debility.   -continue therapies.  PT, OT, SLP   -making gradual gains 2.  New onset A fib/DVT Prophylaxis/Anticoagulation: Pharmaceutical: Other (comment)--Eliquis 3. Pain Management: tylenol prn   -mild right greater troch bursitis, tight TFL    -much improved with ice/stretches 4. Anxiety disorder/Mood: Continue Zoloft with ativan prn. Team to provide ego   5. Neuropsych: This patient is capable of making decisions on his own behalf. 6. Skin/Wound Care: Santyl with wet to dry dressing to areas of breakdown on sacrum.    - Encourage pressure relief measures.    -continue to push fluids 7. Fluids/Electrolytes/Nutrition: I personally reviewed the patient's labs today.      -encourage appropriate po intake. 8.  OSA?Acute hypoxic RF: Continue albuterol nbes tid. Oxygen with activity and CPAP at nights. Sleep study post discharge.     -continue acclimation to CPAP pressure/duration    -trials of CPAP during the day 9. Acute systolic CHF: Question viral etiology. Monitor weights daily.    -Continue Coreg bid, Lasix bid,   Lipitor and baby ASA.    -BP's have been low before this morning. continue to hold Cozaar 25 and norvasc 5mg  daily for now but consider resuming if trend continues Filed Weights   03/25/17 0410 03/28/17 1810 03/29/17 0500  Weight: (!) 140.6 kg (310 lb) (!) 139.7 kg (308 lb) (!) 139.7 kg (308 lb)     -balanced daily weights 10. Morbid Obesity: Encourage weight loss to promote health, respiratory status and mobility 11. Hypokalemia: Managed with supplement. wnl 12. New diagnosis T2DM: HGB A1C- 6.3. Monitor BS ac/hs with SSI for elevated BS.    -reasonable control at present 3/19 13. Follicular lymphoma: In remission.  14. Chronic BLE edema with venous stasis ulcer: UNNA boot in place LLE since 3/13-didn't get removed today. Will take it off tomorrow   - Resumed TEDs.   - Protein/vitamin supplement.  15.  Dysuria with hesitancy:     - urine cx with 100k GNR--- Klebsiella pneumoniae and Proteus mirabilis    -Proteus is sensitive to Bactrim.  Klebsiella ESBL. Spoke with pharmacy yesterday given limited therapeutic options. Will not treat klebsiella and observe for any signs/symptoms  of infection. Remains afebrile, bladder emptying        LOS (Days) 7 A FACE TO FACE EVALUATION WAS PERFORMED  Meredith Staggers, MD 03/29/2017 6:00 PM

## 2017-03-29 NOTE — Progress Notes (Signed)
Physical Therapy Session Note  Patient Details  Name: [name] MRN: [id number] Date of Birth: 1955-07-29  Today's Date: 03/29/2017 PT Individual Time: 1100-1200 PT Individual Time Calculation (min): 60 min   Short Term Goals: Week 1:  PT Short Term Goal 1 (Week 1): Pt will initiate stair training. PT Short Term Goal 2 (Week 1): Pt will ambulate 50 ft with LRAD & min assist. PT Short Term Goal 3 (Week 1): Pt will propel w/c 50 ft for strengthening purposes.  PT Short Term Goal 4 (Week 1): Pt will complete bed<>w/c with min assist & LRAD. Week 2:     Skilled Therapeutic Interventions/Progress Updates:   Pt received supine in bed and agreeable to PT. Supine>sit transfer with supervision assist and min cues for safety.    Stand pivot transfer to [facility] with RW and min assist from PT. All sit<>stand tranfers completed with intermittent min-supervision assist with cuse for safety to return to chair with improved control.   PT instructed pt in gait training 12f x 2 and 369fwith min assist. PT monitored SpO2 throughout gait training with pt on 2L/min, SpO2 remain >93% following each bout of gait training.    [facility] + 2 min, level 3. One therapeutic rest break due to fatigue. SpO2 monitored throughout and remained> 94%, HR ~85-95bpm. Pt reports dizziness and nausea following second bout. PT attempted to obtain BP electronically, unable to process. [facility] and [facility]; pt reports increased dizziness with transfer. BP re-assessed in WC. 87/56 and symptomatic for orthostasis.   [facility] and performed stand pivot transfer to bed with supervision assist and RW. . Sit>supine completed with supervision assist. BP re-assessed in supine 87/50. RN made aware. Pt left supine in bed with call bell in reach and all needs met.          Therapy Documentation Precautions:  Precautions Precautions:  Fall Precaution Comments: supplemental oxygen Restrictions Weight Bearing Restrictions: No   Pain: Pain Assessment Pain Assessment: No/denies pain  See Function Navigator for Current Functional Status.   Therapy/Group: Individual Therapy  AuLorie Phenix/20/2019, 2:13 PM

## 2017-03-29 NOTE — Progress Notes (Signed)
Occupational Therapy Session Note  Patient Details  Name: [name] MRN: [id number] Date of Birth: May 27, 1955  Today's Date: 03/29/2017 OT Individual Time: 0902-1018 OT Individual Time Calculation (min): 76 min    Short Term Goals: Week 1:  OT Short Term Goal 1 (Week 1): Pt will complete all toileting tasks with CGA OT Short Term Goal 2 (Week 1): Pt will don socks with CGA and no more than 2 rest breaks OT Short Term Goal 3 (Week 1): Pt will stand at sink during oral care for 2 minutes with no rest break OT Short Term Goal 4 (Week 1): Pt will navigate around room with RW safely to obtain clothing  Skilled Therapeutic Interventions/Progress Updates:    Pt presents sitting up in w/c with no c/o pain, ready for OT tx session. Pt requesting therapy session in dayroom vs gym this session, self propels w/c to dayroom  for UB strengthening/endurance with 2 seated rest breaks throughout. Session focus on increasing activity tolerance, endurance, and strengthening in preparation for functional task completion. Pt completing x2 rounds of standing at RW with minguard-supervision for sit<>stand and while static standing with UE support. Pt standing for 3-4 min during first trial and 3 min during trial 2 with seated rest breaks in between. SpO2 monitored throughout session and remaining above 94% on 2LO2. Pt completing additional standing activity engaging in game of connect four and standing approx 2-3 min prior to seated rest break. Pt engages in seated bil UE exercise using 4lb dowel rod for 10x each, including bicep curls, forward/backward rows, and chest press. Pt reporting increased soreness/weakness in LUE, applied gentle stretching within pt's tolerance across shoulder flexion and abduction planes. Pt completing x2 rounds on kinetron at 30cm/sec, approx 5 min total with rest breaks throughout. Transported pt back to room total assist for energy conservation, pt completing stand pivot transfer w/c>EOB  and EOB>supine with MinGuard assist. Pt left supine in bed with call bell and needs within reach, bed alarm activated.   Therapy Documentation Precautions:  Precautions Precautions: Fall Precaution Comments: supplemental oxygen Restrictions Weight Bearing Restrictions: No    ADL: ADL ADL Comments: see functional navigator  See Function Navigator for Current Functional Status.   Therapy/Group: Individual Therapy  Raymondo Band 03/29/2017, 8:19 AM

## 2017-03-30 ENCOUNTER — Inpatient Hospital Stay (HOSPITAL_COMMUNITY): Payer: BLUE CROSS/BLUE SHIELD | Admitting: Physical Therapy

## 2017-03-30 ENCOUNTER — Inpatient Hospital Stay (HOSPITAL_COMMUNITY): Payer: BLUE CROSS/BLUE SHIELD | Admitting: Occupational Therapy

## 2017-03-30 ENCOUNTER — Inpatient Hospital Stay (HOSPITAL_COMMUNITY): Payer: BLUE CROSS/BLUE SHIELD | Admitting: Speech Pathology

## 2017-03-30 LAB — GLUCOSE, CAPILLARY
Glucose-Capillary: 113 mg/dL — ABNORMAL HIGH (ref 65–99)
Glucose-Capillary: 92 mg/dL (ref 65–99)
Glucose-Capillary: 98 mg/dL (ref 65–99)
Glucose-Capillary: 119 mg/dL — ABNORMAL HIGH (ref 65–99)

## 2017-03-30 MED ORDER — SALINE SPRAY 0.65 % NA SOLN
2.0000 | NASAL | Status: DC | PRN
  Administered 2017-03-30: 2 via NASAL
  Filled 2017-03-30: qty 44

## 2017-03-30 NOTE — Plan of Care (Signed)
  Problem: RH SKIN INTEGRITY Goal: RH STG ABLE TO PERFORM INCISION/WOUND CARE W/ASSISTANCE Description STG Able To Perform Incision/Wound Care With max Assistance.  Outcome: Progressing  Continue to administer wound care as needed.

## 2017-03-30 NOTE — Progress Notes (Signed)
Social Work Patient ID: [name], male   DOB: Dec 19, 1955, 62 y.o.   MRN: [id number]   Met with pt yesterday to review team conference.  Pt very pleased with progress he is making and feels that is directly helping his anxiety levels.  He is aware and agreeable with targeted d/c date of 3/28 and supervision goals.  His sister and friend are fully prepared to provide this supervision.  Pt continues to work on his tolerance for the CPAP.  Will continue to follow.  Kairie Vangieson, LCSW

## 2017-03-30 NOTE — Progress Notes (Signed)
Pt states he and spouse are able to place CPAP when ready. Advised pt to have RT called if any further assistance is needed.

## 2017-03-30 NOTE — Progress Notes (Signed)
[name] PHYSICAL MEDICINE & REHABILITATION     PROGRESS NOTE    Subjective/Complaints: Up at sink cleaning up. Pleased with progress. Has a dry nose. Pain improving. A little winded after going to [facility]  ROS: Patient denies fever, rash, sore throat, blurred vision, nausea, vomiting, diarrhea, cough, shortness of breath or chest pain, joint or back pain, headache, or mood change.     Objective: Vital Signs: Blood pressure (!) 102/54, pulse 85, temperature 98.2 F (36.8 C), temperature source Oral, resp. rate 18, height 6\' 1"  (1.854 m), weight (!) 138.3 kg (305 lb), SpO2 100 %. No results found. No results for input(s): WBC, HGB, HCT, PLT in the last 72 hours. No results for input(s): NA, K, CL, GLUCOSE, BUN, CREATININE, CALCIUM in the last 72 hours.  Invalid input(s): CO CBG (last 3)  Recent Labs    03/29/17 1656 03/29/17 2109 03/30/17 0637  GLUCAP 113* 155* 113*    Wt Readings from Last 3 Encounters:  03/30/17 (!) 138.3 kg (305 lb)  03/22/17 132.2 kg (291 lb 7.2 oz)  11/07/16 16.5 kg (36 lb 6.4 oz)    Physical Exam:  Constitutional: No distress . Vital signs reviewed. HEENT: EOMI, oral membranes moist Cardiovascular: RRR without murmur. No JVD    Respiratory: CTA Bilaterally without wheezes or rales. Sl sob   GI: BS +, non-tender, non-distended   Musculoskeletal: He exhibits no edema or tenderness.   RLE remains wrapped in UNNA. LLE with trace edema-stable Neurological: He is alert and oriented to person, place, and time. No cranial nerve deficit. Reasonable insight and awareness Motor: UE: 4 to 4+/5 prox to distal. senses pain and light touch in all 4's--stable Musc: pain along right greater troch resolved Skin: Skin is warm and dry.  Sacral wound about 0.5-1" in diameter (not visualized today) Psychiatric: Pleasant and appropriate    Assessment/Plan: 1. Functional and mobility deficits secondary to debility which require 3+ hours per day of interdisciplinary  therapy in a comprehensive inpatient rehab setting. Physiatrist is providing close team supervision and 24 hour management of active medical problems listed below. Physiatrist and rehab team continue to assess barriers to discharge/monitor patient progress toward functional and medical goals.  Function:  Bathing Bathing position   Position: Other (comment)(UB at sink and LB at commode)  Bathing parts Body parts bathed by patient: Right arm, Left arm, Chest, Abdomen, Front perineal area, Right upper leg, Left upper leg, Buttocks Body parts bathed by helper: Left lower leg  Bathing assist Assist Level: Touching or steadying assistance(Pt > 75%)      Upper Body Dressing/Undressing Upper body dressing   What is the patient wearing?: Pull over shirt/dress     Pull over shirt/dress - Perfomed by patient: Thread/unthread right sleeve, Thread/unthread left sleeve, Put head through opening, Pull shirt over trunk          Upper body assist Assist Level: Supervision or verbal cues, Set up   Set up : To obtain clothing/put away  Lower Body Dressing/Undressing Lower body dressing   What is the patient wearing?: Pants, Non-skid slipper socks     Pants- Performed by patient: Thread/unthread right pants leg, Pull pants up/down, Thread/unthread left pants leg Pants- Performed by helper: Thread/unthread left pants leg   Non-skid slipper socks- Performed by helper: Don/doff right sock, Don/doff left sock                  Lower body assist Assist for lower body dressing: Touching or steadying assistance (  Pt > 75%)      Toileting Toileting Toileting activity did not occur: No continent bowel/bladder event Toileting steps completed by patient: Adjust clothing prior to toileting, Performs perineal hygiene, Adjust clothing after toileting Toileting steps completed by helper: Performs perineal hygiene, Adjust clothing after toileting Toileting Assistive Devices: Grab bar or rail  Toileting  assist Assist level: Supervision or verbal cues   Transfers Chair/bed transfer Chair/bed transfer activity did not occur: Safety/medical concerns Chair/bed transfer method: Stand pivot Chair/bed transfer assist level: Supervision or verbal cues Chair/bed transfer assistive device: Medical sales representative Ambulation activity did not occur: Safety/medical concerns   Max distance: 74ft Assist level: Touching or steadying assistance (Pt > 75%)   Wheelchair Wheelchair activity did not occur: Safety/medical concerns Type: Manual Max wheelchair distance: 150 ft Assist Level: Supervision or verbal cues  Cognition Comprehension Comprehension assist level: Follows complex conversation/direction with extra time/assistive device  Expression Expression assist level: Expresses complex ideas: With extra time/assistive device  Social Interaction Social Interaction assist level: Interacts appropriately with others with medication or extra time (anti-anxiety, antidepressant).  Problem Solving Problem solving assist level: Solves complex problems: With extra time  Memory Memory assist level: More than reasonable amount of time   Medical Problem List and Plan: 1.  Generalized weakness, limitation in self-care secondary to debility.   -continue therapies.  PT, OT, SLP   -making continued gains 2.  New onset A fib/DVT Prophylaxis/Anticoagulation: Pharmaceutical: Other (comment)--Eliquis 3. Pain Management: tylenol prn   -mild right greater troch bursitis, tight TFL    -much improved with ice/stretches 4. Anxiety disorder/Mood: Continue Zoloft with ativan prn. Team to provide ego   5. Neuropsych: This patient is capable of making decisions on his own behalf. 6. Skin/Wound Care: Santyl with wet to dry dressing to areas of breakdown on sacrum.    - Encourage pressure relief measures.    -continue to push fluids 7. Fluids/Electrolytes/Nutrition: I personally reviewed the patient's labs today.       -encourage appropriate po intake. 8. OSA?Acute hypoxic RF: Continue albuterol nbes tid. Oxygen with activity and CPAP at nights. Sleep study post discharge.     -continue acclimation to CPAP pressure/duration    -trials of CPAP during the day 9. Acute systolic CHF: Question viral etiology. Monitor weights daily.    -Continue Coreg bid, Lasix bid,   Lipitor and baby ASA.    -BP's have been low before this morning. continue to hold Cozaar 25 and norvasc 5mg  daily for now but consider resuming if trend continues Filed Weights   03/28/17 1810 03/29/17 0500 03/30/17 0643  Weight: (!) 139.7 kg (308 lb) (!) 139.7 kg (308 lb) (!) 138.3 kg (305 lb)     -balanced daily weights 3/21 10. Morbid Obesity: Encourage weight loss to promote health, respiratory status and mobility 11. Hypokalemia: Managed with supplement. wnl 12. New diagnosis T2DM: HGB A1C- 6.3. Monitor BS ac/hs with SSI for elevated BS.    -reasonable control at present 3/21 13. Follicular lymphoma: In remission.  14. Chronic BLE edema with venous stasis ulcer: remove UNNA boot in place LLE today  -elevate, observe   -  TEDs.   - Protein/vitamin supplement.  15.  Dysuria with hesitancy:     - urine cx with 100k GNR--- Klebsiella pneumoniae and Proteus mirabilis    -Proteus is sensitive to Bactrim.  Klebsiella ESBL. Spoke with pharmacy yesterday given limited therapeutic options. Will not treat klebsiella and observe for any signs/symptoms of infection.  Remains afebrile, bladder emptying. Bactrim completes tomorrow morning        LOS (Days) [place] EVALUATION WAS PERFORMED  Meredith Staggers, MD 03/30/2017 8:31 AM

## 2017-03-30 NOTE — Progress Notes (Signed)
Speech Language Pathology Daily Session Note  Patient Details  Name: [name] MRN: [id number] Date of Birth: Feb 28, 1955  Today's Date: 03/30/2017 SLP Individual Time: 0900-0930 SLP Individual Time Calculation (min): 30 min  Short Term Goals: Week 1: SLP Short Term Goal 1 (Week 1): Pt will consume current diet without overt s/s of aspiration and demonstrate use of compensatory swallowing strategies with Mod I.  SLP Short Term Goal 2 (Week 1): Pt will consume trials of regular textures with efficient mastication, complete oral clearance, and minimal overt s/s of aspiration with Supervision verbal cues over two sessions prior to upgrade. SLP Short Term Goal 3 (Week 1): Pt will self monitor and correct errors during functional tasks with Supervision verbal cues.  SLP Short Term Goal 4 (Week 1): Pt will complete complex problem solving tasks with Supervision verbal cues.  SLP Short Term Goal 5 (Week 1): Pt will utilize external memory aides to recall new daily information with Mod I.  Skilled Therapeutic Interventions: Skilled treatment session focused on cognition goals. SLP facilitated session by providing intermittent Min A cues to complete semi-complex deductive reasoning puzzle. Pt left upright in bed, extra deductive reasoning puzzles provided and all needs within reach. Continue per current plan of care.      Function:    Cognition Comprehension Comprehension assist level: Follows complex conversation/direction with extra time/assistive device  Expression   Expression assist level: Expresses complex ideas: With extra time/assistive device  Social Interaction Social Interaction assist level: Interacts appropriately with others with medication or extra time (anti-anxiety, antidepressant).  Problem Solving Problem solving assist level: Solves complex problems: With extra time  Memory Memory assist level: More than reasonable amount of time    Pain Pain Assessment Pain Score: 0-No  pain  Therapy/Group: Individual Therapy  [name] 03/30/2017, 11:06 AM

## 2017-03-30 NOTE — Progress Notes (Signed)
Physical Therapy Session Note  Patient Details  Name: [name] MRN: [id number] Date of Birth: March 30, 1955  Today's Date: 03/30/2017  PT Individual Time: [phone] and 0932-1032 PT Individual Time Calculation (min): 26 min and 60 min  Short Term Goals: Week 1:  PT Short Term Goal 1 (Week 1):  Pt will initiate stair training. PT Short Term Goal 1 - Progress (Week 1): Met PT Short Term Goal 2 (Week 1): Pt will ambulate 50 ft with LRAD & min assist. PT Short Term Goal 2 - Progress (Week 1): Progressing toward goal PT Short Term Goal 3 (Week 1): Pt will propel w/c 50 ft for strengthening purposes.  PT Short Term Goal 3 - Progress (Week 1): Met PT Short Term Goal 4 (Week 1):  Pt will complete bed<>w/c with min assist & LRAD. PT Short Term Goal 4 - Progress (Week 1): Met  Skilled Therapeutic Interventions/Progress Updates:   Session 1:  Pt received in w/c, agreeable to therapy. RN in room administering morning medications. Pt O2 96% on 2L [place]. Pt reports 6/10 pain 2/2 sacral sore but reports pain has improved in past couple days. Pt able to maintain standing position for 3 min. Pt ambulated 60f x2 w/ RW supervision, requiring one rest break 2/2 fatigue and decreased activity tolerance. Pt O2 96% during activity. Pt requiring verbal cuing to maintain RW within BOS, upright posture to not lean on RW, and keeping RW close and in front during turns. Pt reports 5/10 fatigue level after activity. At end of session, pt O2 95%. Pt returned to room, in bed, friend present and all needs within reach.   Session 2: Pt received in bed, agreeable to therapy. O2 98% on 2L [place]. Pt propels w/c 50 ft for BUE strengthening and endurance. Therapist propelled w/c rest of the way to gym for energy conservation. Pt performed 4'' curb with RW steady assist and verbal cuing for sequencing. Pt educated to bring all four RW legs onto curb, then up with strong, down with weak LE. O2 sat 93% and HR 103. Pt reports some anxiety  with curb navigation. Pt able to come down from curb in same manner, steady assist. Pt performs bed mobility with supervision: supine<>sit and rolling to L and R. Mat height was simulated to approximate pt bed at home. During session, pt required verbal cuing for RW management. Pt performed stairs x2 and was educated about stair navigation, using bilat rails, up with strong, down with weak LE. Pt reports increased anxiety with stairs and able to complete 2 stairs before needing to come down and rest 2/2 anxiety and fatigue. Pt reports anxiety stems from decreased strength and energy level and he "just wants to get to the top." Pt educated about energy conservation during stair navigation and pacing himself and being patient as he grows stronger and increases endurance. Pt reports mild dizziness and complains of weakness in bilat ankles w/ stair navigation. O2 sat 96% and HR 112. Therapist propelled pt in w/c back to room, chair<>bed w/ supervision, RN notified for pt request of dressing change, friend in room, and all needs within reach. At end of session, Pt O2 94%.    Therapy Documentation Precautions:  Precautions Precautions: Fall Precaution Comments: supplemental oxygen Restrictions Weight Bearing Restrictions: No   See Function Navigator for Current Functional Status.   Therapy/Group: Individual Therapy  LCaffie Damme3/21/2019, 4:46 PM

## 2017-03-30 NOTE — Progress Notes (Signed)
Occupational Therapy Session Note  Patient Details  Name: [name] MRN: [id number] Date of Birth: [birth year]  Today's Date: 03/30/2017 OT Individual Time: 8502-7741 OT Individual Time Calculation (min): 88 min    Short Term Goals: Week 1:  OT Short Term Goal 1 (Week 1): Pt will complete all toileting tasks with CGA OT Short Term Goal 2 (Week 1): Pt will don socks with CGA and no more than 2 rest breaks OT Short Term Goal 3 (Week 1): Pt will stand at sink during oral care for 2 minutes with no rest break OT Short Term Goal 4 (Week 1): Pt will navigate around room with RW safely to obtain clothing  Skilled Therapeutic Interventions/Progress Updates:    Upon entering the room, pt supine in bed with sister present in room. Pt agreeable to OT intervention. Pt propelled wheelchair 100' to [place] with 2 rest breaks and supervision for safety. Pt engaged in standing dynavision task with use of RW and B UE's with no LOB and close supervision for safety. Pt with reaction time of 1.4 seconds while standing for 2 minutes to complete task. Pt standing second time for 3 minutes while on foam wedge to creat greater challenge. Pt required steady assistance overall for balance secondary to increased challenge. Pt with 1 LOB requiring min A to correct. Pt with increased perceived rate of exertion but O2 remained 93% and above while on 2 L O2 via [place]. Pt needing multiple rest breaks secondary to fatigue. Pt propelled wheelchair to nu step and transferred with supervision and use of RW. Pt engaged in task for 4 minutes and then 2.5 minutes with rest break between bouts. Pt returning to wheelchair and propelling self back to room at end of session. Pt remained in wheelchair with RN present giving medications. Call bell and all needed items within reach.   Therapy Documentation Precautions:  Precautions Precautions: Fall Precaution Comments: supplemental oxygen Restrictions Weight Bearing Restrictions:  No General:   Vital Signs: Therapy Vitals Temp: 97.6 F (36.4 C) Temp Source: Oral Pulse Rate: 92 Resp: 19 BP: 107/66 Patient Position (if appropriate): Lying Oxygen Therapy SpO2: 95 % O2 Device: Nasal Cannula O2 Flow Rate (L/min): 2 L/min Pain:   ADL: ADL ADL Comments: see functional navigator Vision   Perception    Praxis   Exercises:   Other Treatments:    See Function Navigator for Current Functional Status.   Therapy/Group: Individual Therapy  Gypsy Decant 03/30/2017, 3:24 PM

## 2017-03-30 NOTE — Progress Notes (Signed)
Physical Therapy Weekly Progress Note  Patient Details  Name: [name] MRN: [id number] Date of Birth: [birth year]  Beginning of progress report period: March 23, 2017 End of progress report period: March 30, 2017  Today's Date: 03/30/2017  Patient has met 3 of 4 short term goals.  Pt is making consistent progress towards all LTG's. Pt has been titrated down to 2L/min supplemental oxygen at rest & during activities. Pt continues to be limited by poor endurance and activity tolerance. Pt would benefit from continued skilled PT treatment to focus on strength & endurance training, balance, stair negotiation, transfers, gait training, and pt/caregiver education prior to d/c. Pt's friend, Jenny Reichmann, and sister, [name] Art, have been present during sessions and are actively observing & assisting during sessions.   Patient continues to demonstrate the following deficits muscle weakness, decreased cardiorespiratoy endurance and decreased oxygen support, decreased coordination, and decreased standing balance, decreased postural control and decreased balance strategies and therefore will continue to benefit from skilled PT intervention to increase functional independence with mobility.  Patient progressing toward long term goals..  Continue plan of care.  PT Short Term Goals Week 1:  PT Short Term Goal 1 (Week 1):  Pt will initiate stair training. PT Short Term Goal 1 - Progress (Week 1): Met PT Short Term Goal 2 (Week 1): Pt will ambulate 50 ft with LRAD & min assist. PT Short Term Goal 2 - Progress (Week 1): Progressing toward goal PT Short Term Goal 3 (Week 1): Pt will propel w/c 50 ft for strengthening purposes.  PT Short Term Goal 3 - Progress (Week 1): Met PT Short Term Goal 4 (Week 1):  Pt will complete bed<>w/c with min assist & LRAD. PT Short Term Goal 4 - Progress (Week 1): Met Week 2:  PT Short Term Goal 1 (Week 2): STG = LTG due to estimated d/c date.    Therapy Documentation Precautions:   Precautions Precautions: Fall Precaution Comments: supplemental oxygen Restrictions Weight Bearing Restrictions: No   See Function Navigator for Current Functional Status.  Therapy/Group: Individual Therapy  Waunita Schooner 03/30/2017, 8:48 AM

## 2017-03-31 ENCOUNTER — Inpatient Hospital Stay (HOSPITAL_COMMUNITY): Payer: BLUE CROSS/BLUE SHIELD | Admitting: Physical Therapy

## 2017-03-31 ENCOUNTER — Inpatient Hospital Stay (HOSPITAL_COMMUNITY): Payer: BLUE CROSS/BLUE SHIELD

## 2017-03-31 ENCOUNTER — Inpatient Hospital Stay (HOSPITAL_COMMUNITY): Payer: BLUE CROSS/BLUE SHIELD | Admitting: Speech Pathology

## 2017-03-31 ENCOUNTER — Inpatient Hospital Stay (HOSPITAL_COMMUNITY): Payer: BLUE CROSS/BLUE SHIELD | Admitting: Occupational Therapy

## 2017-03-31 DIAGNOSIS — L89152 Pressure ulcer of sacral region, stage 2: Secondary | ICD-10-CM

## 2017-03-31 LAB — GLUCOSE, CAPILLARY
GLUCOSE-CAPILLARY: 107 mg/dL — AB (ref 65–99)
GLUCOSE-CAPILLARY: 112 mg/dL — AB (ref 65–99)
GLUCOSE-CAPILLARY: 107 mg/dL — AB (ref 65–99)
Glucose-Capillary: 131 mg/dL — ABNORMAL HIGH (ref 65–99)

## 2017-03-31 NOTE — Progress Notes (Signed)
Occupational Therapy Session Note  Patient Details  Name: [name] MRN: [id number] Date of Birth: 1955/02/23  Today's Date: 03/31/2017 OT Individual Time: 9163-8466 OT Individual Time Calculation (min): 45 min    Short Term Goals: Week 1:  OT Short Term Goal 1 (Week 1): Pt will complete all toileting tasks with CGA OT Short Term Goal 2 (Week 1): Pt will don socks with CGA and no more than 2 rest breaks OT Short Term Goal 3 (Week 1): Pt will stand at sink during oral care for 2 minutes with no rest break OT Short Term Goal 4 (Week 1): Pt will navigate around room with RW safely to obtain clothing  Skilled Therapeutic Interventions/Progress Updates:    Pt received in room supine in bed, with no c/o pain and agreeable to therapy. Vc provided for safe maneuvering from supine to EOB, to w/c, with steadying cues provided during ambulation. OT POC reviewed with pt, as well as goals. Education provided re energy conservation techniques and the use of AE/AD during ADL routine at home. Pt used urinal at seated level with set up. Pt propelled w/c 163ft in hallway with 3 rest breaks required d/t fatigue and SOB. Pt set up to play Wii in therapy gym. Pt completed ~10x  Sit to stand transfers with SBA and was able to release R UE from RW to swing controller with good righting reactions and static standing balance observed. Skilled monitoring of O2 vitals throughout session to assess pulmonary stability in response to increase task demands. Pt's O2 sat ranged from 93% directly following activity to 95% at rest. Pt required intermittent extended seated rest breaks d/t SOB. Pt left in therapy gym with SLP.   Therapy Documentation Precautions:  Precautions Precautions: Fall Precaution Comments: supplemental oxygen Restrictions Weight Bearing Restrictions: No Vital Signs: Therapy Vitals Pulse Rate: 89 BP: 104/74 Pain: Pain Assessment Pain Score: 0-No pain Faces Pain Scale: No hurt ADL: ADL ADL  Comments: see functional navigator  See Function Navigator for Current Functional Status.   Therapy/Group: Individual Therapy  Curtis Sites 03/31/2017, 11:27 AM

## 2017-03-31 NOTE — Progress Notes (Signed)
Speech Language Pathology Weekly Progress and Session Note  Patient Details  Name: [name] MRN: [id number] Date of Birth: Mar 03, 1955  Beginning of progress report period: March 24, 2017 End of progress report period: March 31, 2017  Today's Date: 03/31/2017 SLP Individual Time: 5035-4656 SLP Individual Time Calculation (min): 45 min  Short Term Goals: Week 1: SLP Short Term Goal 1 (Week 1): Pt will consume current diet without overt s/s of aspiration and demonstrate use of compensatory swallowing strategies with Mod I.  SLP Short Term Goal 1 - Progress (Week 1): Met SLP Short Term Goal 2 (Week 1): Pt will consume trials of regular textures with efficient mastication, complete oral clearance, and minimal overt s/s of aspiration with Supervision verbal cues over two sessions prior to upgrade. SLP Short Term Goal 2 - Progress (Week 1): Met SLP Short Term Goal 3 (Week 1): Pt will self monitor and correct errors during functional tasks with Supervision verbal cues.  SLP Short Term Goal 3 - Progress (Week 1): Met SLP Short Term Goal 4 (Week 1): Pt will complete complex problem solving tasks with Supervision verbal cues.  SLP Short Term Goal 4 - Progress (Week 1): Met SLP Short Term Goal 5 (Week 1): Pt will utilize external memory aides to recall new daily information with Mod I. SLP Short Term Goal 5 - Progress (Week 1): Met    New Short Term Goals: Week 2: SLP Short Term Goal 1 (Week 2): Pt will self monitor and correct errors during semi-complex tasks with Supervision verbal cues.  SLP Short Term Goal 2 (Week 2): Pt will complete complex problem solving tasks with Mod I.  SLP Short Term Goal 3 (Week 2): Pt will demonstrate alternating attention with supervision cues for ~ 45 minutes with supervision cues.  SLP Short Term Goal 4 (Week 2): Pt will demonstrate anticipatory awareness to listing activities that are safe to perform within home and work environment and supervision cues.    Weekly Progress Updates: Pt has made good progress in skilled ST sessions and as a result he has met 5 of 5 STGs. Pt with good progress consuming regular diet with thin liquids as well as improved cognitive abilities with problem solving, recall and awareness. Pt continues to require cues to self-correct during tasks as will as completing more complex problem solving tasks. Given pt's career as hairstylist, recommend targeting alternating attention.      Intensity: Minumum of 1-2 x/day, 30 to 90 minutes Frequency: 3 to 5 out of 7 days Duration/Length of Stay: 04/06/17 Treatment/Interventions: Cognitive remediation/compensation;Environmental controls;Multimodal communication approach;Therapeutic Activities;Functional tasks;Cueing hierarchy;Internal/external aids;Patient/family education   Daily Session  Skilled Therapeutic Interventions: Skilled treatment session focused on cognition goals. SLP facilitated session by providing Min A to supervision cues to complete daily scheduling task with music playing. Pt required Mod A to self-monitor and organize task as he was mildly impulsive when reading all information. Pt was returned to room, left upright in bed with all needs within reach and extra deductive reasoning puzzles for the weekend.      Function:   Eating Eating   Modified Consistency Diet: No             Cognition Comprehension Comprehension assist level: Follows complex conversation/direction with extra time/assistive device  Expression   Expression assist level: Expresses complex ideas: With extra time/assistive device  Social Interaction Social Interaction assist level: Interacts appropriately with others with medication or extra time (anti-anxiety, antidepressant).  Problem Solving Problem solving assist level:  Solves complex problems: With extra time;Solves complex 90% of the time/cues < 10% of the time  Memory Memory assist level: More than reasonable amount of time    General    Pain Pain Assessment Pain Score: 0-No pain Faces Pain Scale: No hurt  Therapy/Group: Individual Therapy  [name] 03/31/2017, 12:51 PM

## 2017-03-31 NOTE — Progress Notes (Signed)
Patient states his wife assists him in placing self on and off of CPAP. RT informed him and wife if they run into any trouble have RN contact RT.

## 2017-03-31 NOTE — Progress Notes (Signed)
Physical Therapy Session Note  Patient Details  Name: [name] MRN: [id number] Date of Birth: [birth year]  Today's Date: 03/31/2017 PT Individual Time: 0801-0900 PT Individual Time Calculation (min): 59 min   Short Term Goals: Week 1:  PT Short Term Goal 1 (Week 1):  Pt will initiate stair training. PT Short Term Goal 1 - Progress (Week 1): Met PT Short Term Goal 2 (Week 1): Pt will ambulate 50 ft with LRAD & min assist. PT Short Term Goal 2 - Progress (Week 1): Progressing toward goal PT Short Term Goal 3 (Week 1): Pt will propel w/c 50 ft for strengthening purposes.  PT Short Term Goal 3 - Progress (Week 1): Met PT Short Term Goal 4 (Week 1):  Pt will complete bed<>w/c with min assist & LRAD. PT Short Term Goal 4 - Progress (Week 1): Met  Skilled Therapeutic Interventions/Progress Updates:    Pt received in w/c, agreeable to treatment. RN in room administering morning medications. MD present to check sacral wound. Pt denies pain but complains of fatigue and increased work of breathing. Pt O2 is 95% on 2L [place]. Pt ambulated w/ RW steady assist fading to close supervision 28f, 217f 2082frequiring rest breaks and verbal cuing for upright posture, forward gaze, and RW within BOS. Therapist educated pt on importance of rest breaks and energy conservation. Therapist weaned O2 to 1L [place], notified RN and continued to monitor O2 sat closely. Therapist propelled w/c rest of way to gym for energy conservation. Pt performed standing zoom ball steady assist fading to close supervision, requiring verbal cuing for pursed-lip breathing. Pt O2 sat >93% throughout session with activity. Pt complains of increased work of breathing 8/10. Pt performed 4 stairs steady assist with heavy use of bilat railings and step-to pattern. Pt reported light headedness at top of stairs and needed to sit down. Pt able to safely descend stairs in same manner and sit in w/c to rest. Pt performed 2 stairs steady assist in same  manner before needing to descend stairs to rest. Pt propelled w/c 50 ft to increase endurance and BUE strengthening, pt reporting his "arms are worn out." Therapist propelled w/c rest of way to room for time management. Pt returned to bed, friend present, all needs within reach.   Therapy Documentation Precautions:  Precautions Precautions: Fall Precaution Comments: supplemental oxygen Restrictions Weight Bearing Restrictions: No   See Function Navigator for Current Functional Status.   Therapy/Group: Individual Therapy  LesCaffie Damme22/2019, 9:21 AM

## 2017-03-31 NOTE — Progress Notes (Signed)
Occupational Therapy Session Note  Patient Details  Name: [name] MRN: [id number] Date of Birth: 11-25-1955  Today's Date: 03/31/2017 OT Individual Time: 5456-2563 OT Individual Time Calculation (min): 42 min   Short Term Goals: Week 1:  OT Short Term Goal 1 (Week 1): Pt will complete all toileting tasks with CGA OT Short Term Goal 2 (Week 1): Pt will don socks with CGA and no more than 2 rest breaks OT Short Term Goal 3 (Week 1): Pt will stand at sink during oral care for 2 minutes with no rest break OT Short Term Goal 4 (Week 1): Pt will navigate around room with RW safely to obtain clothing  Skilled Therapeutic Interventions/Progress Updates:     Pt greeted EOB post using urinal. ADL needs met. Ready to go. After OT donned Teds and pt donned his gripper socks, stand pivot<w/c completed with Min A. Tx focus on standing balance without UE support,  activity tolerance, and maintaining stable vitals during functional activity. Pt self propelled to dayroom with extra time and rest breaks. While standing at elevated table, pt playing keyboard, recalling familiar hymns by memory. Also took requests from residents/staff. With pt permission, utilized lavender via inhalation to promote deep breathing. Longest standing window 6 minutes! He was very proud of himself! 02 sats on 1L <94% throughout session, with HR between 110-120BPM. He was then escorted back to room and transferred back to bed. Pt left with NT at session exit.   Therapy Documentation Precautions:  Precautions Precautions: Fall Precaution Comments: supplemental oxygen Restrictions Weight Bearing Restrictions: No Vital Signs: Therapy Vitals Temp: 97.9 F (36.6 C) Temp Source: Oral Pulse Rate: 97 BP: (!) 98/54 Patient Position (if appropriate): Lying Oxygen Therapy SpO2: 94 % ADL: ADL ADL Comments: see functional navigator     See Function Navigator for Current Functional Status.   Therapy/Group: Individual  Therapy  [name] A Endy Easterly 03/31/2017, 4:02 PM

## 2017-03-31 NOTE — Plan of Care (Signed)
  Problem: RH SKIN INTEGRITY Goal: RH STG ABLE TO PERFORM INCISION/WOUND CARE W/ASSISTANCE Description STG Able To Perform Incision/Wound Care With max Assistance.  Outcome: Progressing  Continued to perform wound care as ordered

## 2017-03-31 NOTE — Progress Notes (Signed)
[facility]ICAL [facility]IC[facility] & REHABILITATION     PROGRESS NOTE    Subjective/Complaints: Up at sink cleaning up. Pleased with progress. Has a dry nose. Pain improving. A little winded after going to [facility]  ROS: Patient denies fever, rash, sore throat, blurred vision, nausea, vomiting, diarrhea, cough, shortness of breath or chest pain, joint or back pain, headache, or mood change.     Objective: Vital Signs: Blood pressure 104/74, pulse 89, temperature 97.9 F (36.6 C), temperature source Oral, resp. rate 19, height 6\' 1"  (1.854 m), weight (!) 138.3 kg (305 lb), SpO2 95 %. No results found. No results for input(s): WBC, HGB, HCT, PLT in the last 72 hours. No results for input(s): NA, K, CL, GLUCOSE, BUN, CREATININE, CALCIUM in the last 72 hours.  Invalid input(s): CO CBG (last 3)  Recent Labs    03/30/17 1705 03/30/17 2051 03/31/17 0641  GLUCAP 98 119* 107*    Wt Readings from Last 3 Encounters:  03/30/17 (!) 138.3 kg (305 lb)  03/22/17 132.2 kg (291 lb 7.2 oz)  11/07/16 16.5 kg (36 lb 6.4 oz)    Physical Exam:  Constitutional: No distress . Vital signs reviewed. HEENT: EOMI, oral membranes moist Cardiovascular: RRR without murmur. No JVD    Respiratory: CTA Bilaterally without wheezes or rales. Normal effort    GI: BS +, non-tender, non-distended   Musculoskeletal: He exhibits no edema or tenderness.   LE's with tr to 1+ edema, stable Neurological: He is alert and oriented to person, place, and time. No cranial nerve deficit. Reasonable insight and awareness Motor: UE: 4 to 4+/5 prox to distal. senses pain and light touch in all 4's--stable Musc: pain along right greater troch resolved Skin: Skin is warm and dry.  Sacral wounds closing with granulation. Area sl macerated/moist still. Foam dressing covering area Psychiatric: Pleasant and appropriate    Assessment/Plan: 1. Functional and mobility deficits secondary to debility which require 3+ hours per day of  interdisciplinary therapy in a comprehensive inpatient rehab setting. Physiatrist is providing close team supervision and 24 hour management of active medical problems listed below. Physiatrist and rehab team continue to assess barriers to discharge/monitor patient progress toward functional and medical goals.  Function:  Bathing Bathing position   Position: Other (comment)(UB at sink and LB at commode)  Bathing parts Body parts bathed by patient: Right arm, Left arm, Chest, Abdomen, Front perineal area, Right upper leg, Left upper leg, Buttocks Body parts bathed by helper: Left lower leg  Bathing assist Assist Level: Touching or steadying assistance(Pt > 75%)      Upper Body Dressing/Undressing Upper body dressing   What is the patient wearing?: Pull over shirt/dress     Pull over shirt/dress - Perfomed by patient: Thread/unthread right sleeve, Thread/unthread left sleeve, Put head through opening, Pull shirt over trunk          Upper body assist Assist Level: Supervision or verbal cues, Set up   Set up : To obtain clothing/put away  Lower Body Dressing/Undressing Lower body dressing   What is the patient wearing?: Pants, Non-skid slipper socks     Pants- Performed by patient: Thread/unthread right pants leg, Pull pants up/down, Thread/unthread left pants leg Pants- Performed by helper: Thread/unthread left pants leg   Non-skid slipper socks- Performed by helper: Don/doff right sock, Don/doff left sock                  Lower body assist Assist for lower body dressing: Touching or  steadying assistance (Pt > 75%)      Toileting Toileting Toileting activity did not occur: No continent bowel/bladder event Toileting steps completed by patient: Adjust clothing prior to toileting, Performs perineal hygiene, Adjust clothing after toileting Toileting steps completed by helper: Performs perineal hygiene, Adjust clothing after toileting Toileting Assistive Devices: Grab bar  or rail  Toileting assist Assist level: Supervision or verbal cues   Transfers Chair/bed transfer Chair/bed transfer activity did not occur: Safety/medical concerns Chair/bed transfer method: Stand pivot Chair/bed transfer assist level: Supervision or verbal cues Chair/bed transfer assistive device: Medical sales representative Ambulation activity did not occur: Safety/medical concerns   Max distance: 30 ft Assist level: Supervision or verbal cues   Wheelchair Wheelchair activity did not occur: Safety/medical concerns Type: Manual Max wheelchair distance: 50 ft Assist Level: Supervision or verbal cues  Cognition Comprehension Comprehension assist level: Follows complex conversation/direction with extra time/assistive device  Expression Expression assist level: Expresses complex ideas: With extra time/assistive device  Social Interaction Social Interaction assist level: Interacts appropriately with others with medication or extra time (anti-anxiety, antidepressant).  Problem Solving Problem solving assist level: Solves complex problems: With extra time  Memory Memory assist level: More than reasonable amount of time   Medical Problem List and Plan: 1.  Generalized weakness, limitation in self-care secondary to debility.   -continue therapies.  PT, OT, SLP   -making continued gains 2.  New onset A fib/DVT Prophylaxis/Anticoagulation: Pharmaceutical: Other (comment)--Eliquis 3. Pain Management: tylenol prn   -mild right greater troch bursitis, tight TFL    -much improved with ice/stretches 4. Anxiety disorder/Mood: Continue Zoloft with ativan prn. Team to provide ego   5. Neuropsych: This patient is capable of making decisions on his own behalf. 6. Skin/Wound Care: Santyl with wet to dry dressing to areas of breakdown on sacrum.    - Encourage pressure relief measures.    -keep sacral area a little dryer  7. Fluids/Electrolytes/Nutrition: I personally reviewed the patient's  labs today.      -encourage appropriate po intake. 8. OSA?Acute hypoxic RF: Continue albuterol nbes tid. Oxygen with activity and CPAP at nights. Sleep study post discharge.     -continue acclimation to CPAP pressure/duration    -tolerated 4 hours last night 9. Acute systolic CHF: Question viral etiology. Monitor weights daily.    -Continue Coreg bid, Lasix bid,   Lipitor and baby ASA.    -BP's continue to be soft.  hold Cozaar 25 and norvasc 5mg  daily for now   [facility] Weights   03/28/17 1810 03/29/17 0500 03/30/17 0643  Weight: (!) 139.7 kg (308 lb) (!) 139.7 kg (308 lb) (!) 138.3 kg (305 lb)     -balanced daily weights 3/22 10. Morbid Obesity: Encourage weight loss to promote health, respiratory status and mobility 11. Hypokalemia: Managed with supplement. wnl 12. New diagnosis T2DM: HGB A1C- 6.3. Monitor BS ac/hs with SSI for elevated BS.    -reasonable control at present 3/22 13. Follicular lymphoma: In remission.  14. Chronic BLE edema with venous stasis ulcer: removed UNNA boot  LLE    -elevate, observe   -  TEDs.   - Protein/vitamin supplement.  15.  Dysuria with hesitancy:     - urine cx with 100k GNR--- Klebsiella pneumoniae and Proteus mirabilis    -Proteus is sensitive to Bactrim.  Klebsiella ESBL. Spoke with pharmacy yesterday given limited therapeutic options. Will not treat klebsiella and observe for any signs/symptoms of infection. Remains afebrile, bladder emptying. Bactrim  completes tomorrow morning        LOS (Days) [place] EVALUATION WAS PERFORMED  Meredith Staggers, MD 03/31/2017 9:22 AM

## 2017-03-31 NOTE — Progress Notes (Signed)
Orthopedic Tech Progress Note Patient Details:  [name] 1955-10-02 [id number]  Patient ID: [name], male   DOB: 06-Mar-1955, 62 y.o.   MRN: [id number] [facility] boot removal  Karolee Stamps 03/31/2017, 3:11 PM

## 2017-04-01 ENCOUNTER — Inpatient Hospital Stay (HOSPITAL_COMMUNITY): Payer: BLUE CROSS/BLUE SHIELD | Admitting: Occupational Therapy

## 2017-04-01 DIAGNOSIS — E119 Type 2 diabetes mellitus without complications: Secondary | ICD-10-CM

## 2017-04-01 DIAGNOSIS — I10 Essential (primary) hypertension: Secondary | ICD-10-CM

## 2017-04-01 LAB — GLUCOSE, CAPILLARY
GLUCOSE-CAPILLARY: 108 mg/dL — AB (ref 65–99)
GLUCOSE-CAPILLARY: 109 mg/dL — AB (ref 65–99)
Glucose-Capillary: 135 mg/dL — ABNORMAL HIGH (ref 65–99)
Glucose-Capillary: 142 mg/dL — ABNORMAL HIGH (ref 65–99)

## 2017-04-01 NOTE — Progress Notes (Signed)
Occupational Therapy Session Note  Patient Details  Name: [name] MRN: [id number] Date of Birth: 31-Dec-1955  Today's Date: 04/01/2017 OT Individual Time: 1401-1500 OT Individual Time Calculation (min): 59 min   Short Term Goals: Week 1:  OT Short Term Goal 1 (Week 1): Pt will complete all toileting tasks with CGA OT Short Term Goal 2 (Week 1): Pt will don socks with CGA and no more than 2 rest breaks OT Short Term Goal 3 (Week 1): Pt will stand at sink during oral care for 2 minutes with no rest break OT Short Term Goal 4 (Week 1): Pt will navigate around room with RW safely to obtain clothing    Skilled Therapeutic Interventions/Progress Updates:    Pt greeted supine in bed. Requesting to go outdoors. Family present, and actively assisting/participating throughout session. Tx focus on balance, activity tolerance, psychosocial health, and functional ambulation with device. After completing stand pivot<w/c with steady assist, pt was escorted to outdoor setting in w/c (holding RW in arms for strengthening purposes). Pt ambulated with RW and steady assist over concrete surfaces, transferred to low community bench. Mod A for sit<stand from bench with cues for technique. Afterwards transitioned to piano playing while indoors. Pt played music by memory, using foot pedals as needed, while his family members sang. Pt smiling and visibly experiencing flow while engaging in this meaningful occupation with family present. He reported feeling great relief knowing that he could still play despite lack of practice during hospitalization. Pt was then taken back to unit (still carrying RW) and returned to room. Pt agreeable to sit up in w/c to increase OOB tolerance. He was left with family at session exit.    02 sats <94% on 1L during session.    Therapy Documentation Precautions:  Precautions Precautions: Fall Precaution Comments: supplemental oxygen Restrictions Weight Bearing Restrictions:  No Pain: No c/o pain during tx   ADL: ADL ADL Comments: see functional navigator    See Function Navigator for Current Functional Status.   Therapy/Group: Individual Therapy  Abreanna Drawdy A Meaghan Whistler 04/01/2017, 4:13 PM

## 2017-04-01 NOTE — Progress Notes (Signed)
[name] is a 62 y.o. male Nov 21, 1955 [id number]  Subjective: No new complaints or problems. Slept well. Feeling OK. ? To DC MM as unnecessary and not taking  Objective: Vital signs in last 24 hours: Temp:  [97.9 F (36.6 C)-98.2 F (36.8 C)] 98.2 F (36.8 C) (03/23 0515) Pulse Rate:  [92-97] 92 (03/23 0515) Resp:  [18] 18 (03/23 0515) BP: (98)/(54-79) 98/79 (03/23 0515) SpO2:  [94 %-97 %] 97 % (03/23 0515) Weight change:  Last BM Date: 03/30/17  Intake/Output from previous day: 03/22 0701 - 03/23 0700 In: 720 [P.O.:720] Out: 1450 [Urine:1450]  Physical Exam General: No apparent distress   In bed, Wife sitting at [facility] Lungs: Normal effort. Lungs clear to auscultation, no crackles or wheezes. Cardiovascular: Regular rate and rhythm, no edema  Lab Results: BMET    Component Value Date/Time   NA 134 (L) 03/27/2017 0412   NA 137 10/24/2016 1440   K 4.2 03/27/2017 0412   K 3.6 10/24/2016 1440   CL 98 (L) 03/27/2017 0412   CL 105 05/14/2012 0840   CO2 26 03/27/2017 0412   CO2 26 10/24/2016 1440   GLUCOSE 114 (H) 03/27/2017 0412   GLUCOSE 94 10/24/2016 1440   GLUCOSE 102 (H) 05/14/2012 0840   BUN 22 (H) 03/27/2017 0412   BUN 16.2 10/24/2016 1440   CREATININE 0.81 03/27/2017 0412   CREATININE 0.8 10/24/2016 1440   CALCIUM 8.9 03/27/2017 0412   CALCIUM 9.2 10/24/2016 1440   GFRNONAA >60 03/27/2017 0412   GFRAA >60 03/27/2017 0412   CBC    Component Value Date/Time   WBC 12.6 (H) 03/23/2017 0429   RBC 4.19 (L) 03/23/2017 0429   HGB 12.2 (L) 03/23/2017 0429   HGB 14.5 10/24/2016 1440   HCT 37.8 (L) 03/23/2017 0429   HCT 43.8 10/24/2016 1440   PLT 272 03/23/2017 0429   PLT 177 10/24/2016 1440   MCV 90.2 03/23/2017 0429   MCV 92.4 10/24/2016 1440   MCH 29.1 03/23/2017 0429   MCHC 32.3 03/23/2017 0429   RDW 16.1 (H) 03/23/2017 0429   RDW 15.2 (H) 10/24/2016 1440   LYMPHSABS 1.6 03/23/2017 0429   LYMPHSABS 1.7 10/24/2016 1440   MONOABS 1.9 (H) 03/23/2017 0429    MONOABS 0.9 10/24/2016 1440   EOSABS 0.3 03/23/2017 0429   EOSABS 0.3 10/24/2016 1440   BASOSABS 0.1 03/23/2017 0429   BASOSABS 0.1 10/24/2016 1440   CBG's (last 3):   Recent Labs    03/31/17 1652 03/31/17 2109 04/01/17 0623  GLUCAP 107* 131* 108*   LFT's Lab Results  Component Value Date   ALT 38 03/23/2017   AST 25 03/23/2017   ALKPHOS 177 (H) 03/23/2017   BILITOT 1.4 (H) 03/23/2017    Studies/Results: No results found.  Medications:  I have reviewed the patient's current medications. Scheduled Medications: . allopurinol  300 mg Oral Daily  . apixaban  5 mg Oral BID  . aspirin  81 mg Oral Daily  . atorvastatin  20 mg Oral q1800  . carvedilol  25 mg Oral BID WC  . collagenase   Topical Daily  . febuxostat  40 mg Oral Daily  . furosemide  60 mg Oral BID  . heparin lock flush  500 Units Intracatheter Q30 days  . Influenza vac split quadrivalent PF  0.5 mL Intramuscular Tomorrow-1000  . insulin aspart  0-15 Units Subcutaneous TID WC  . magic mouthwash w/lidocaine  10 mL Oral QID  . Melatonin  6 mg Oral  QHS  . multivitamin with minerals  1 tablet Oral Daily  . mupirocin cream   Topical Daily  . pantoprazole  40 mg Oral Daily  . potassium chloride  40 mEq Oral BID  . sertraline  50 mg Oral Daily  . sodium chloride flush  10-40 mL Intracatheter Q12H  . vitamin C  500 mg Oral TID   PRN Medications: acetaminophen, albuterol, alum & mag hydroxide-simeth, bisacodyl, butalbital-acetaminophen-caffeine, diphenhydrAMINE, guaiFENesin-dextromethorphan, heparin lock flush **AND** heparin lock flush, lidocaine, LORazepam, ondansetron (ZOFRAN) IV, oxyCODONE, polyethylene glycol, prochlorperazine **OR** prochlorperazine **OR** prochlorperazine, sodium chloride, sodium chloride flush, sodium phosphate, traMADol, traZODone  Assessment/Plan: Principal Problem:   Debility Active Problems:   Pressure injury of skin   Persistent atrial fibrillation (HCC)   Non-Hodgkin's lymphoma  (HCC)   Benign essential HTN   OSA (obstructive sleep apnea)   Morbid obesity (HCC)   Diabetes mellitus, new onset ([place])   1. Debility from multi med illness - continue CIR therapy as ongoing 2. AF - anticoag as ongoing 3. OSA - continue CPAP 4 New DM - continue SSI and nutritional education  Length of stay, days: 10   Valerie A. Asa Lente, MD 04/01/2017, 10:54 AM

## 2017-04-02 ENCOUNTER — Inpatient Hospital Stay (HOSPITAL_COMMUNITY): Payer: BLUE CROSS/BLUE SHIELD | Admitting: Occupational Therapy

## 2017-04-02 LAB — GLUCOSE, CAPILLARY
GLUCOSE-CAPILLARY: 102 mg/dL — AB (ref 65–99)
Glucose-Capillary: 109 mg/dL — ABNORMAL HIGH (ref 65–99)
Glucose-Capillary: 109 mg/dL — ABNORMAL HIGH (ref 65–99)
Glucose-Capillary: 150 mg/dL — ABNORMAL HIGH (ref 65–99)

## 2017-04-02 NOTE — Progress Notes (Signed)
Occupational Therapy Session Note  Patient Details  Name: [name] MRN: [id number] Date of Birth: 22-Apr-1955  Today's Date: 04/02/2017 OT Individual Time: 0034-9179 OT Individual Time Calculation (min): 62 min   Short Term Goals: Week 1:  OT Short Term Goal 1 (Week 1): Pt will complete all toileting tasks with CGA OT Short Term Goal 2 (Week 1): Pt will don socks with CGA and no more than 2 rest breaks OT Short Term Goal 3 (Week 1): Pt will stand at sink during oral care for 2 minutes with no rest break OT Short Term Goal 4 (Week 1): Pt will navigate around room with RW safely to obtain clothing  Skilled Therapeutic Interventions/Progress Updates:    Pt greeted finishing lunch with family present. No c/o pain. Tx focus on pt/family education, d/c planning, and 02 weaning during functional activity. Pt was escorted to therapy apartment and we discussed walk-in vs. Tub shower DME as pt has both types of showers at home. After collaboration and OT education, pt decided he wants a TTB at time of d/c. We practiced these transfers using RW at ambulatory level with Min A and cues for technique. Family present to observe. His nephew also plans to install grab bars and he took note of placement of horizontal grab bars that pt wants at home. Also discussed bathroom modifications to maximize safety. Pt then ambulated from tub shower room to therapy gym with 3 rest breaks and Min A using RW. Pt placed on .5L 02 for weaning purposes, and 02 remained between 92-96%. Educated pt throughout on deep breathing techniques and postural awareness to maintain stable vitals. For remainder of way to room, pt was taken via w/c. Then sister, [name], was provided with hands on transfer training and was cleared on safety plan to assist with toilet transfers. Pt left with family at session exit.   With RN consent, pt left on .5L 02.   Therapy Documentation Precautions:  Precautions Precautions: Fall Precaution Comments:  supplemental oxygen Restrictions Weight Bearing Restrictions: No Vital Signs: Therapy Vitals Temp: 98.4 F (36.9 C) Temp Source: Oral Pulse Rate: 67 Oxygen Therapy SpO2: 99 % O2 Device: Nasal Cannula ADL: ADL ADL Comments: see functional navigator    See Function Navigator for Current Functional Status.   Therapy/Group: Individual Therapy  Morghan Kester A Cruze Zingaro 04/02/2017, 3:55 PM

## 2017-04-02 NOTE — Progress Notes (Signed)
[name] is a 62 y.o. male Aug 06, 1955 [id number]  Subjective: No new complaints or problems. Slept well. Feeling well, especially after shower this AM  Objective: Vital signs in last 24 hours: Temp:  [98 F (36.7 C)-98.5 F (36.9 C)] 98 F (36.7 C) (03/24 0500) Pulse Rate:  [82-85] 85 (03/24 0500) Resp:  [17] 17 (03/24 0500) BP: (120-132)/(72-76) 132/72 (03/24 0500) SpO2:  [98 %] 98 % (03/24 0500) Weight:  [138 kg (304 lb 4.8 oz)-138.3 kg (305 lb)] 138.3 kg (305 lb) (03/24 0500) Weight change:  Last BM Date: 03/30/17  Intake/Output from previous day: 03/23 0701 - 03/24 0700 In: 840 [P.O.:840] Out: 775 [Urine:775]  Physical Exam General: No apparent distress   In bed, male sitting at [facility] Lungs: Normal effort. Lungs clear to auscultation, no crackles or wheezes. Cardiovascular: Regular rate and rhythm, no edema  Lab Results: BMET    Component Value Date/Time   NA 134 (L) 03/27/2017 0412   NA 137 10/24/2016 1440   K 4.2 03/27/2017 0412   K 3.6 10/24/2016 1440   CL 98 (L) 03/27/2017 0412   CL 105 05/14/2012 0840   CO2 26 03/27/2017 0412   CO2 26 10/24/2016 1440   GLUCOSE 114 (H) 03/27/2017 0412   GLUCOSE 94 10/24/2016 1440   GLUCOSE 102 (H) 05/14/2012 0840   BUN 22 (H) 03/27/2017 0412   BUN 16.2 10/24/2016 1440   CREATININE 0.81 03/27/2017 0412   CREATININE 0.8 10/24/2016 1440   CALCIUM 8.9 03/27/2017 0412   CALCIUM 9.2 10/24/2016 1440   GFRNONAA >60 03/27/2017 0412   GFRAA >60 03/27/2017 0412   CBC    Component Value Date/Time   WBC 12.6 (H) 03/23/2017 0429   RBC 4.19 (L) 03/23/2017 0429   HGB 12.2 (L) 03/23/2017 0429   HGB 14.5 10/24/2016 1440   HCT 37.8 (L) 03/23/2017 0429   HCT 43.8 10/24/2016 1440   PLT 272 03/23/2017 0429   PLT 177 10/24/2016 1440   MCV 90.2 03/23/2017 0429   MCV 92.4 10/24/2016 1440   MCH 29.1 03/23/2017 0429   MCHC 32.3 03/23/2017 0429   RDW 16.1 (H) 03/23/2017 0429   RDW 15.2 (H) 10/24/2016 1440   LYMPHSABS 1.6  03/23/2017 0429   LYMPHSABS 1.7 10/24/2016 1440   MONOABS 1.9 (H) 03/23/2017 0429   MONOABS 0.9 10/24/2016 1440   EOSABS 0.3 03/23/2017 0429   EOSABS 0.3 10/24/2016 1440   BASOSABS 0.1 03/23/2017 0429   BASOSABS 0.1 10/24/2016 1440   CBG's (last 3):   Recent Labs    04/01/17 2138 04/02/17 0654 04/02/17 1130  GLUCAP 142* 109* 102*   LFT's Lab Results  Component Value Date   ALT 38 03/23/2017   AST 25 03/23/2017   ALKPHOS 177 (H) 03/23/2017   BILITOT 1.4 (H) 03/23/2017    Studies/Results: No results found.  Medications:  I have reviewed the patient's current medications. Scheduled Medications: . allopurinol  300 mg Oral Daily  . apixaban  5 mg Oral BID  . aspirin  81 mg Oral Daily  . atorvastatin  20 mg Oral q1800  . carvedilol  25 mg Oral BID WC  . collagenase   Topical Daily  . febuxostat  40 mg Oral Daily  . furosemide  60 mg Oral BID  . heparin lock flush  500 Units Intracatheter Q30 days  . Influenza vac split quadrivalent PF  0.5 mL Intramuscular Tomorrow-1000  . insulin aspart  0-15 Units Subcutaneous TID WC  . Melatonin  6  mg Oral QHS  . multivitamin with minerals  1 tablet Oral Daily  . mupirocin cream   Topical Daily  . pantoprazole  40 mg Oral Daily  . potassium chloride  40 mEq Oral BID  . sertraline  50 mg Oral Daily  . sodium chloride flush  10-40 mL Intracatheter Q12H  . vitamin C  500 mg Oral TID   PRN Medications: acetaminophen, albuterol, alum & mag hydroxide-simeth, bisacodyl, butalbital-acetaminophen-caffeine, diphenhydrAMINE, guaiFENesin-dextromethorphan, heparin lock flush **AND** heparin lock flush, lidocaine, LORazepam, ondansetron (ZOFRAN) IV, oxyCODONE, polyethylene glycol, prochlorperazine **OR** prochlorperazine **OR** prochlorperazine, sodium chloride, sodium chloride flush, sodium phosphate, traMADol, traZODone  Assessment/Plan: Principal Problem:   Debility Active Problems:   Pressure injury of skin   Persistent atrial  fibrillation (HCC)   Non-Hodgkin's lymphoma (HCC)   Benign essential HTN   OSA (obstructive sleep apnea)   Morbid obesity (HCC)   Diabetes mellitus, new onset ([place])   1. Debility from multi med illness - continue CIR therapy as ongoing. Anxious to continue DC planning for home later this week 2. AF - anticoag as ongoing 3. OSA - continue CPAP 4 New DM - continue SSI and nutritional education  Length of stay, days: 11   Keyundra Fant A. Asa Lente, MD 04/02/2017, 11:50 AM

## 2017-04-03 ENCOUNTER — Inpatient Hospital Stay (HOSPITAL_COMMUNITY): Payer: BLUE CROSS/BLUE SHIELD | Admitting: Occupational Therapy

## 2017-04-03 ENCOUNTER — Inpatient Hospital Stay (HOSPITAL_COMMUNITY): Payer: BLUE CROSS/BLUE SHIELD | Admitting: Physical Therapy

## 2017-04-03 ENCOUNTER — Inpatient Hospital Stay (HOSPITAL_COMMUNITY): Payer: BLUE CROSS/BLUE SHIELD | Admitting: Speech Pathology

## 2017-04-03 ENCOUNTER — Telehealth: Payer: Self-pay | Admitting: Pulmonary Disease

## 2017-04-03 LAB — BASIC METABOLIC PANEL
ANION GAP: 12 (ref 5–15)
BUN: 16 mg/dL (ref 6–20)
CO2: 21 mmol/L — AB (ref 22–32)
Calcium: 9.4 mg/dL (ref 8.9–10.3)
Chloride: 101 mmol/L (ref 101–111)
Creatinine, Ser: 0.64 mg/dL (ref 0.61–1.24)
GFR calc Af Amer: >60 mL/min (ref >60)
GLUCOSE: 101 mg/dL — AB (ref 65–99)
POTASSIUM: 4 mmol/L (ref 3.5–5.1)
Sodium: 134 mmol/L — ABNORMAL LOW (ref 135–145)

## 2017-04-03 LAB — CBC
HEMATOCRIT: 42.2 % (ref 39.0–52.0)
Hemoglobin: 13.6 g/dL (ref 13.0–17.0)
MCH: 29.5 pg (ref 26.0–34.0)
MCHC: 32.2 g/dL (ref 30.0–36.0)
MCV: 91.5 fL (ref 78.0–100.0)
Platelets: 360 10*3/uL (ref 150–400)
RBC: 4.61 MIL/uL (ref 4.22–5.81)
RDW: 16.6 % — ABNORMAL HIGH (ref 11.5–15.5)
WBC: 15.9 10*3/uL — AB (ref 4.0–10.5)

## 2017-04-03 LAB — GLUCOSE, CAPILLARY
GLUCOSE-CAPILLARY: 105 mg/dL — AB (ref 65–99)
Glucose-Capillary: 102 mg/dL — ABNORMAL HIGH (ref 65–99)
Glucose-Capillary: 121 mg/dL — ABNORMAL HIGH (ref 65–99)
Glucose-Capillary: 107 mg/dL — ABNORMAL HIGH (ref 65–99)

## 2017-04-03 NOTE — Progress Notes (Signed)
Occupational Therapy Session Note  Patient Details  Name: [name] MRN: [id number] Date of Birth: [birth year]  Today's Date: 04/03/2017 OT Individual Time: 7616-0737 OT Individual Time Calculation (min): 29 min   Short Term Goals: Week 1:  OT Short Term Goal 1 (Week 1): Pt will complete all toileting tasks with CGA OT Short Term Goal 2 (Week 1): Pt will don socks with CGA and no more than 2 rest breaks OT Short Term Goal 3 (Week 1): Pt will stand at sink during oral care for 2 minutes with no rest break OT Short Term Goal 4 (Week 1): Pt will navigate around room with RW safely to obtain clothing    Skilled Therapeutic Interventions/Progress Updates:    Pt greeted in w/c. Reported fatigue and requesting for seated therapy. He engaged in bilateral UE therex with use of 6.5# bar x10-15 reps with instruction on technique. Pt visibly weaker in L UE. We did pendulums with Lt in standing with RW. Solidified DME needs during session also. At end of tx pt was left in w/c with all needs.    02 sats 95-96% on .5L  Therapy Documentation Precautions:  Precautions Precautions: Fall Precaution Comments: supplemental oxygen Restrictions Weight Bearing Restrictions: No Vital Signs: Therapy Vitals Temp: 98.2 F (36.8 C) Temp Source: Oral Pulse Rate: (!) 57 Resp: 18 BP: 110/60 Patient Position (if appropriate): Sitting Oxygen Therapy SpO2: 95 % O2 Device: Nasal Cannula O2 Flow Rate (L/min): 0.5 L/min Pain: Pain Assessment Pain Scale: 0-10 Pain Score: 0-No pain Pain Type: Acute pain Pain Location: Back Pain Intervention(s): Medication (See eMAR);Repositioned ADL: ADL ADL Comments: see functional navigator     See Function Navigator for Current Functional Status.   Therapy/Group: Individual Therapy  Jet Traynham A Jenesa Foresta 04/03/2017, 4:09 PM

## 2017-04-03 NOTE — Progress Notes (Signed)
Physical Therapy Session Note  Patient Details  Name: [name] MRN: [id number] Date of Birth: [birth year]  Today's Date: 04/03/2017 PT Individual Time: 0354-6568 PT Individual Time Calculation (min): 28 min   Short Term Goals: Week 1:  PT Short Term Goal 1 (Week 1):  Pt will initiate stair training. PT Short Term Goal 1 - Progress (Week 1): Met PT Short Term Goal 2 (Week 1): Pt will ambulate 50 ft with LRAD & min assist. PT Short Term Goal 2 - Progress (Week 1): Progressing toward goal PT Short Term Goal 3 (Week 1): Pt will propel w/c 50 ft for strengthening purposes.  PT Short Term Goal 3 - Progress (Week 1): Met PT Short Term Goal 4 (Week 1):  Pt will complete bed<>w/c with min assist & LRAD. PT Short Term Goal 4 - Progress (Week 1): Met  Skilled Therapeutic Interventions/Progress Updates:   Pt received in bed, agreeable to therapy. Pt denies pain but states he is not feeling great today. Pt O2 sat is 96% on 0.5L O2.  Therapist propelled w/c to gym for time management and energy conservation. Pt performs 4 stairs x2 w/ bilateral railings, steady assist, requiring verbal cuing for sequencing and a rest break in between sets. Therapist educated pt on energy conservation and need for a chair at the midway landing of his staircase. Therapist educated pt on discharge recommendations and possible need to sleep on first floor at home if he cannot demonstrate 12 stairs (6 steps, landing, 6 steps) by his grad day. Pt is determined to sleep upstairs and knows what is required for this to happen. Therapist and pt will make a decision tomorrow regarding whether pt is able to safely go up and down his stairs at home. Pt reports bilateral railings are being installed today at his home. Pt educated on pursed-lip breathing and O2 sats >93% throughout session. Pt propelled w/c for BUE strengthening and endurance 20 ft and therapist propelled pt in w/c rest of the way to his room 2/2 fatigue and time  management. At end of session, pt seated in w/c, all needs within reach and friend present. Pt O2 sat 95%.     Therapy Documentation Precautions:  Precautions Precautions: Fall Precaution Comments: supplemental oxygen Restrictions Weight Bearing Restrictions: No   See Function Navigator for Current Functional Status.   Therapy/Group: Individual Therapy  Caffie Damme 04/03/2017, 3:13 PM

## 2017-04-03 NOTE — Progress Notes (Signed)
Occupational Therapy Session Note  Patient Details  Name: [name] MRN: [id number] Date of Birth: Feb 13, 1955  Today's Date: 04/03/2017 OT Individual Time: 8250-0370 OT Individual Time Calculation (min): 72 min    Short Term Goals: Week 1:  OT Short Term Goal 1 (Week 1): Pt will complete all toileting tasks with CGA OT Short Term Goal 2 (Week 1): Pt will don socks with CGA and no more than 2 rest breaks OT Short Term Goal 3 (Week 1): Pt will stand at sink during oral care for 2 minutes with no rest break OT Short Term Goal 4 (Week 1): Pt will navigate around room with RW safely to obtain clothing  Skilled Therapeutic Interventions/Progress Updates:   Upon entering the room, pt supine in bed with caregiver present in room. Pt performed bed mobility and ambulation with RW with overall supervision. Caregiver providing supervision and safety for pt during this session for hands on training. Pt having BM this session performing hygiene while standing with overall supervision. Pt removed clothing while seated on toilet and transferred onto TTB for bathing at shower level with overall supervision. Pt utilized LH sponge to increase I in LB self care. Pt returning to sit in wheelchair for grooming tasks at sink and dressing with pt requiring rest breaks as needed. Pt needing steady assistance for balance with clothing management in standing. Pt remained seated in wheelchair at end of session with call bell and all needed items within reach.   Therapy Documentation Precautions:  Precautions Precautions: Fall Precaution Comments: supplemental oxygen Restrictions Weight Bearing Restrictions: No General:   Vital Signs:   Pain:   ADL: ADL ADL Comments: see functional navigator  See Function Navigator for Current Functional Status.   Therapy/Group: Individual Therapy  Gypsy Decant 04/03/2017, 9:47 AM

## 2017-04-03 NOTE — Progress Notes (Signed)
Slept good. Tolerating CPAP for most of night. Dressing CD&I to sacrum and right ankle. BLE edema and heels elevated off bed with pillows. Encouraged by progress, but concerned about lack of endurance. [name] A

## 2017-04-03 NOTE — Telephone Encounter (Signed)
Spoke with Olin Hauser. She stated that per Dr. Nelda Marseille, the patient will need a sleep study. Olin Hauser wants to know if Dr. Halford Chessman could go ahead and order the sleep study and the review the results with the patient during his consult. Bryson Ha that I would need to ask VS first, she verbalized understanding.    VS, please advise. Thanks!

## 2017-04-03 NOTE — Plan of Care (Signed)
  Problem: Consults Goal: RH GENERAL PATIENT EDUCATION Description See Patient Education module for education specifics. Outcome: Progressing   Problem: RH BOWEL ELIMINATION Goal: RH STG MANAGE BOWEL WITH ASSISTANCE Description STG Manage Bowel with supervision.  Outcome: Progressing Flowsheets (Taken 04/03/2017 1221) STG: Pt will manage bowels with assistance: 6-Modified independent Goal: RH STG MANAGE BOWEL W/MEDICATION W/ASSISTANCE Description STG Manage Bowel with Medication with supervision.  Outcome: Progressing Flowsheets (Taken 04/03/2017 1221) STG: Pt will manage bowels with medication with assistance: 6-Modified independent Goal: RH STG MANAGE BOWEL W/EQUIPMENT W/ASSISTANCE Description STG Manage Bowel With Equipment With min assist  Outcome: Progressing Flowsheets (Taken 04/03/2017 1221) STG: Pt will manage bowels with equipment with assistance: 6-Modified independent   Problem: RH BLADDER ELIMINATION Goal: RH STG MANAGE BLADDER WITH ASSISTANCE Description STG Manage Bladder With supervision  Outcome: Progressing Flowsheets (Taken 04/03/2017 1221) STG: Pt will manage bladder with assistance: 5-Supervision/set up Goal: RH STG MANAGE BLADDER WITH MEDICATION WITH ASSISTANCE Description STG Manage Bladder With Medication With min assist  Outcome: Progressing Flowsheets (Taken 04/03/2017 1221) STG: Pt will manage bladder with medication with assistance: 5-Supervision/set up   Problem: RH SKIN INTEGRITY Goal: RH STG MAINTAIN SKIN INTEGRITY WITH ASSISTANCE Description STG Maintain Skin Integrity With mod Assistance.  Outcome: Progressing Flowsheets (Taken 04/03/2017 1221) STG: Maintain skin integrity with assistance: 4-Minimal assistance Goal: RH STG ABLE TO PERFORM INCISION/WOUND CARE W/ASSISTANCE Description STG Able To Perform Incision/Wound Care With max Assistance.  Outcome: Progressing Flowsheets (Taken 04/03/2017 1221) STG: Pt will be able to perform  incision/wound care with assistance: 4-Minimal assistance   Problem: RH SAFETY Goal: RH STG ADHERE TO SAFETY PRECAUTIONS W/ASSISTANCE/DEVICE Description STG Adhere to Safety Precautions With min Assistance/Device.  Outcome: Progressing Flowsheets (Taken 04/03/2017 1221) STG:Pt will adhere to safety precautions with assistance/device: 4-Minimal assistance Goal: RH STG DECREASED RISK OF FALL WITH ASSISTANCE Description STG Decreased Risk of Fall With min Assistance.  Outcome: Progressing Flowsheets (Taken 04/03/2017 1221) RFX:[id number] risk of fall  with assistance/device: 5-Supervision/cueing Goal: RH STG DEMO UNDERSTANDING HOME SAFETY PRECAUTIONS Outcome: Progressing   Problem: RH KNOWLEDGE DEFICIT GENERAL Goal: RH STG INCREASE KNOWLEDGE OF SELF CARE AFTER HOSPITALIZATION Outcome: Progressing

## 2017-04-03 NOTE — Telephone Encounter (Signed)
Can you please check if his insurance requires a face to face visit as outpt first before getting sleep study set up.  If no such requirement, then okay to send order for home sleep study.

## 2017-04-03 NOTE — Progress Notes (Signed)
[facility]     PROGRESS NOTE    Subjective/Complaints: No new complaints. Using cpap through most of night now. Was outside this weekend. Appreciates work of team.   ROS: Patient denies fever, rash, sore throat, blurred vision, nausea, vomiting, diarrhea, cough, shortness of breath or chest pain, joint or back pain, headache, or mood change.    Objective: Vital Signs: Blood pressure 105/64, pulse 92, temperature 97.7 F (36.5 C), temperature source Oral, resp. rate 18, height 6\' 1"  (1.854 m), weight (!) 139.6 kg (307 lb 12.8 oz), SpO2 94 %. No results found. No results for input(s): WBC, HGB, HCT, PLT in the last 72 hours. No results for input(s): NA, K, CL, GLUCOSE, BUN, CREATININE, CALCIUM in the last 72 hours.  Invalid input(s): CO CBG (last 3)  Recent Labs    04/02/17 1653 04/02/17 2122 04/03/17 0611  GLUCAP 109* 150* 102*    Wt Readings from Last 3 Encounters:  04/03/17 (!) 139.6 kg (307 lb 12.8 oz)  03/22/17 132.2 kg (291 lb 7.2 oz)  11/07/16 16.5 kg (36 lb 6.4 oz)    Physical Exam:  Constitutional: No distress . Vital signs reviewed. HEENT: EOMI, oral membranes moist Cardiovascular: RRR without murmur. No JVD    Respiratory: CTA Bilaterally without wheezes or rales. Normal effort    GI: BS +, non-tender, non-distended   Musculoskeletal: He exhibits no edema or tenderness.   LE's with tr to 1+ edema, unchanged Neurological: He is alert and oriented to person, place, and time. No cranial nerve deficit. Reasonable insight and awareness Motor: UE: 4 to 4+/5 prox to distal. senses pain and light touch in all 4's--stable Musc: pain along right greater troch minimal at best Skin: Skin is warm and dry.  Sacral wounds closing with granulation. Area sl macerated/moist still. Foam dressing covering area Psychiatric: Pleasant and appropriate    Assessment/Plan: 1. Functional and mobility deficits secondary to debility which require  3+ hours per day of interdisciplinary therapy in a comprehensive inpatient rehab setting. Physiatrist is providing close team supervision and 24 hour management of active medical problems listed below. Physiatrist and rehab team continue to assess barriers to discharge/monitor patient progress toward functional and medical goals.  Function:  Bathing Bathing position   Position: Other (comment)(UB at sink and LB at commode)  Bathing parts Body parts bathed by patient: Right arm, Left arm, Chest, Abdomen, Front perineal area, Right upper leg, Left upper leg, Buttocks Body parts bathed by helper: Left lower leg  Bathing assist Assist Level: Touching or steadying assistance(Pt > 75%)      Upper Body Dressing/Undressing Upper body dressing   What is the patient wearing?: Pull over shirt/dress     Pull over shirt/dress - Perfomed by patient: Thread/unthread right sleeve, Thread/unthread left sleeve, Put head through opening, Pull shirt over trunk          Upper body assist Assist Level: Supervision or verbal cues, Set up   Set up : To obtain clothing/put away  Lower Body Dressing/Undressing Lower body dressing   What is the patient wearing?: Non-skid slipper socks     Pants- Performed by patient: Thread/unthread right pants leg, Pull pants up/down, Thread/unthread left pants leg Pants- Performed by helper: Thread/unthread left pants leg Non-skid slipper socks- Performed by patient: Don/doff right sock, Don/doff left sock Non-skid slipper socks- Performed by helper: Don/doff right sock, Don/doff left sock  Lower body assist Assist for lower body dressing: Touching or steadying assistance (Pt > 75%)      Toileting Toileting Toileting activity did not occur: No continent bowel/bladder event(used urinal, no BM) Toileting steps completed by patient: Adjust clothing prior to toileting, Adjust clothing after toileting, Performs perineal hygiene Toileting steps  completed by helper: Performs perineal hygiene, Adjust clothing after toileting Toileting Assistive Devices: Grab bar or rail  Toileting assist Assist level: Set up/obtain supplies   Transfers Chair/bed transfer Chair/bed transfer activity did not occur: Safety/medical concerns Chair/bed transfer method: Stand pivot Chair/bed transfer assist level: Supervision or verbal cues Chair/bed transfer assistive device: Medical sales representative Ambulation activity did not occur: Safety/medical concerns   Max distance: 30 ft Assist level: Supervision or verbal cues   Wheelchair Wheelchair activity did not occur: Safety/medical concerns Type: Manual Max wheelchair distance: 50 ft Assist Level: Supervision or verbal cues  Cognition Comprehension Comprehension assist level: Follows complex conversation/direction with extra time/assistive device  Expression Expression assist level: Expresses complex ideas: With extra time/assistive device  Social Interaction Social Interaction assist level: Interacts appropriately with others with medication or extra time (anti-anxiety, antidepressant).  Problem Solving Problem solving assist level: Solves complex problems: With extra time, Solves complex 90% of the time/cues < 10% of the time  Memory Memory assist level: More than reasonable amount of time   Medical Problem List and Plan: 1.  Generalized weakness, limitation in self-care secondary to debility.   -continue therapies.  PT, OT, SLP   -making continued gains 2.  New onset A fib/DVT Prophylaxis/Anticoagulation: Pharmaceutical: Other (comment)--Eliquis 3. Pain Management: tylenol prn   -mild right greater troch bursitis,      -much improved with ice/stretches 4. Anxiety disorder/Mood: Continue Zoloft with ativan prn. Team to provide ego   5. Neuropsych: This patient is capable of making decisions on his own behalf. 6. Skin/Wound Care: Santyl with wet to dry dressing to areas of breakdown  on sacrum.    - Encourage pressure relief measures.    -working to keep sacral area a little dryer  7. Fluids/Electrolytes/Nutrition: I personally reviewed the patient's labs today.      -encourage appropriate po intake. 8. OSA?Acute hypoxic RF: Continue albuterol nbes tid. Oxygen with activity and CPAP at nights. Sleep study post discharge.     -using CPAP most of night    -only on 0.5L oxygen [place] 9. Acute systolic CHF: Question viral etiology. Monitor weights daily.    -Continue Coreg bid, Lasix bid,   Lipitor and baby ASA.    -BP's continue to be soft.  hold Cozaar 25 and norvasc 5mg  daily for now   [facility] Weights   04/01/17 1350 04/02/17 0500 04/03/17 0523  Weight: (!) 138 kg (304 lb 4.8 oz) (!) 138.3 kg (305 lb) (!) 139.6 kg (307 lb 12.8 oz)     -balanced daily weights 3/25  10. Morbid Obesity: Encourage weight loss to promote health, respiratory status and mobility 11. Hypokalemia: Managed with supplement. wnl 12. New diagnosis T2DM: HGB A1C- 6.3. Monitor BS ac/hs with SSI for elevated BS.    -reasonable control at present 3/22 13. Follicular lymphoma: In remission.  14. Chronic BLE edema with venous stasis ulcer: removed UNNA boot  LLE    -elevate, observe   -  TEDs.   - Protein/vitamin supplement.  15.  Dysuria with hesitancy:     - urine cx with 100k GNR--- Klebsiella pneumoniae and Proteus mirabilis    -Proteus is  sensitive to Bactrim.  Klebsiella ESBL. Spoke with pharmacy yesterday given limited therapeutic options. Will not treat klebsiella and observe for any signs/symptoms of infection. Remains afebrile, bladder emptying. Bactrim completed        LOS (Days) [place] EVALUATION WAS PERFORMED  Meredith Staggers, MD 04/03/2017 9:00 AM

## 2017-04-03 NOTE — Progress Notes (Signed)
Speech Language Pathology Daily Session Note  Patient Details  Name: BYFORD SCHOOLS MRN: [id number] Date of Birth: [birth year]  Today's Date: 04/03/2017 SLP Individual Time: 0930-1030 SLP Individual Time Calculation (min): 60 min  Short Term Goals: Week 2: SLP Short Term Goal 1 (Week 2): Pt will self monitor and correct errors during semi-complex tasks with Supervision verbal cues.  SLP Short Term Goal 2 (Week 2): Pt will complete complex problem solving tasks with Mod I.  SLP Short Term Goal 3 (Week 2): Pt will demonstrate alternating attention with supervision cues for ~ 45 minutes with supervision cues.  SLP Short Term Goal 4 (Week 2): Pt will demonstrate anticipatory awareness to listing activities that are safe to perform within home and work environment and supervision cues.   Skilled Therapeutic Interventions: Skilled treatment session focused on cognitive goals. SLP facilitated session by providing Min A verbal cues for problem solving during a moderately complex categorization and organization task. Pt required Min A verbal cues to self-monitor and correct errors throughout task. Pt demonstrated alternating attention for ~45 minutes with Supervision verbal cues throughout session. Pt left upright in wheelchair with friend present. Continue with current plan of care.      Function:  Cognition Comprehension Comprehension assist level: Follows complex conversation/direction with extra time/assistive device  Expression   Expression assist level: Expresses complex ideas: With extra time/assistive device  Social Interaction Social Interaction assist level: Interacts appropriately with others with medication or extra time (anti-anxiety, antidepressant).  Problem Solving Problem solving assist level: Solves basic problems with no assist  Memory Memory assist level: More than reasonable amount of time    Pain Pain Assessment Pain Scale: 0-10 Pain Score: 0-No pain   Therapy/Group:  Individual Therapy  Meredeth Ide  SLP - Student 04/03/2017, 11:45 AM

## 2017-04-03 NOTE — Telephone Encounter (Signed)
PCCs, can one of you all check with the patient's insurance to see if he will need an face to face visit? Thanks! Per his chart, he has [name].

## 2017-04-04 ENCOUNTER — Inpatient Hospital Stay (HOSPITAL_COMMUNITY): Payer: BLUE CROSS/BLUE SHIELD | Admitting: Physical Therapy

## 2017-04-04 ENCOUNTER — Inpatient Hospital Stay (HOSPITAL_COMMUNITY): Payer: BLUE CROSS/BLUE SHIELD | Admitting: Speech Pathology

## 2017-04-04 ENCOUNTER — Inpatient Hospital Stay (HOSPITAL_COMMUNITY): Payer: BLUE CROSS/BLUE SHIELD

## 2017-04-04 LAB — GLUCOSE, CAPILLARY
GLUCOSE-CAPILLARY: 112 mg/dL — AB (ref 65–99)
GLUCOSE-CAPILLARY: 136 mg/dL — AB (ref 65–99)
Glucose-Capillary: 112 mg/dL — ABNORMAL HIGH (ref 65–99)
Glucose-Capillary: 116 mg/dL — ABNORMAL HIGH (ref 65–99)

## 2017-04-04 MED ORDER — ADULT MULTIVITAMIN W/MINERALS CH: 1.0000 | ORAL_TABLET | Freq: Every day | ORAL | Status: DC

## 2017-04-04 MED ORDER — COLLAGENASE 250 UNIT/GM EX OINT
TOPICAL_OINTMENT | Freq: Every day | CUTANEOUS | Status: DC
  Administered 2017-04-04 – 2017-04-06 (×3): via TOPICAL
  Filled 2017-04-04: qty 30

## 2017-04-04 NOTE — Progress Notes (Signed)
Physical Therapy Session Note  Patient Details  Name: [name] MRN: [id number] Date of Birth: 13-Aug-1955  Today's Date: 04/04/2017 PT Individual Time: 0800-0901 PT Individual Time Calculation (min): 61 min   Short Term Goals: Week 1:  PT Short Term Goal 1 (Week 1):  Pt will initiate stair training. PT Short Term Goal 1 - Progress (Week 1): Met PT Short Term Goal 2 (Week 1): Pt will ambulate 50 ft with LRAD & min assist. PT Short Term Goal 2 - Progress (Week 1): Progressing toward goal PT Short Term Goal 3 (Week 1): Pt will propel w/c 50 ft for strengthening purposes.  PT Short Term Goal 3 - Progress (Week 1): Met PT Short Term Goal 4 (Week 1):  Pt will complete bed<>w/c with min assist & LRAD. PT Short Term Goal 4 - Progress (Week 1): Met  Skilled Therapeutic Interventions/Progress Updates:   Pt received in w/c, agreeable to therapy. Pt denies pain and states he is feeling much better today than yesterday. Pt O2 is 96% on 0.5L [name] and therapist decided to wean him to room air and monitor sats closely, PA aware. Therapist propelled pt in w/c to gym for energy conservation. Pt O2 sat remains at 96% on room air. Pt performs 6 steps x2 with bilateral railings, rest break in middle, simulating home environment, steady assist to close supervision and requiring verbal cuing for sequencing. Pt able to verbalize understanding and sequencing back to therapist. Pt has bilateral railings installed at home and will place chair at landing for rest break. Pt educated about energy conservation and importance of pacing himself, especially on stairs. Pt O2 >93% with activity and requires verbal cuing for pursed-lip breathing. Pt performs 1 curb with RW close supervision, verbalizes sequencing to therapist prior to trial. 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 Pt ambulates 71f with RW, close supervision, demonstrating forward lean on RW and  decreased step length. Pt reports 7/10 work of breathing level. Pt ambulates 572fin same manner before requiring rest break 2/2 fatigue and decreased activity tolerance. Pt O2 sat remain above 93% with gait. Pt propels w/c back to room for BUE strengthening and endurance, 5061f35 ft, [id number] rest breaks and use of BLE to propel as his BUE fatigue. Pt educated about manual w/c use and importance of locking w/c when transferring as we are recommending manual w/c for community mobility. At end of session, pt remained seated in w/c, O2 sat 96% on room air and all needs within reach.   Therapy Documentation Precautions:  Precautions Precautions: Fall Precaution Comments: supplemental oxygen Restrictions Weight Bearing Restrictions: No   See Function Navigator for Current Functional Status.   Therapy/Group: Individual Therapy  LeslCaffie Damme6/2019, 9:22 AM

## 2017-04-04 NOTE — Progress Notes (Addendum)
Occupational Therapy Session Note  Patient Details  Name: [name] MRN: [id number] Date of Birth: 1955/11/23  Today's Date: 04/04/2017 OT Individual Time: 7829-5621. Session 2: 1330-1414 OT Individual Time Calculation (min): 60 min , Session 2: 44 min   Short Term Goals: Week 1:  OT Short Term Goal 1 (Week 1): Pt will complete all toileting tasks with CGA OT Short Term Goal 2 (Week 1): Pt will don socks with CGA and no more than 2 rest breaks OT Short Term Goal 3 (Week 1): Pt will stand at sink during oral care for 2 minutes with no rest break OT Short Term Goal 4 (Week 1): Pt will navigate around room with RW safely to obtain clothing  Skilled Therapeutic Interventions/Progress Updates:    Session 1: Pt received supine in bed with no c/o pain and agreeable to therapy. Pt transferred out of bed into w/c with SBA, with vc provided for safety awareness. Pt propelled w/c 156f down hallway with 1 rest break required and increased time. Pt then provided with handout re energy conservation strategies and engaged in extensive discussion re implementation of strategies, planning of his day, return to work, energy expenditure, and overall safety awareness in the home. Pt then requested to use stepper machine for functional activity tolerance/edurance. Pt completed 6 rounds of 50 steps, followed by skilled monitoring of SpO2. Pt was educated in self-monitoring of SpO2 levels, including training pt to accurately guess his exertion levels to reduce fall risk and promote activity pacing. Pt's O2 levels were between 90-95% throughout session and with exertion. Pt still remains SOB during exertion, but his O2 levels are remaining above 90%. Pt transferred back to supine in bed with SBA and all needs were met.   Session 2: Pt received supine in bed with no c/o pain and agreeable to therapy. Pt propelled w/c to therapy gym, 1017fwith SBA and 2 rest breaks required. Pt set up to play Wii with family member. Pt  completed 10x sit to stand transfers, using R UE to swing controller while maintaining static balance with L UE on RW and CGA overall. Pt required intermittent vc for use of seated rest break and for monitoring of O2 vitals. During a 1 minute stand pt de-sated to 87% and required a prolonged rest break seated. Educated pt re effect of heavy work in morning on afternoon activity tolerance, with pt brainstorming with family member running errands in the morning to conserve energy. 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   Therapy Documentation Precautions:  Precautions Precautions: Fall Precaution Comments: supplemental oxygen Restrictions Weight Bearing Restrictions: No  Pain: Pain Assessment Pain Scale: 0-10 Pain Score: 0-No pain Faces Pain Scale: No hurt ADL: ADL ADL Comments: see functional navigator  See Function Navigator for Current Functional Status.   Therapy/Group: Individual Therapy  SaCurtis Sites/26/2019, 12:01 PM

## 2017-04-04 NOTE — Telephone Encounter (Signed)
If pt is a an in-pt the order can be put it at discharge and we can schedule it [name]

## 2017-04-04 NOTE — Consult Note (Signed)
[facility] Nurse wound follow up Wound type: Pressure injuries Noted upon arrival today patient has new venous stasis ulceration right medial malleolus  Measurement:  Sacrum: 0.7cm x 0.1cm x 0.1cm; some epithelial buds noted 2.5cm x 3.0cm x 0.1cm; right medial malleolus  0.4cm x 0.4cm x 0.1cm; 100% pink, clean, moist  Wound bed: see above  Drainage (amount, consistency, odor) minimal from sacrum/buttock and ankle wound Periwound: intact at the buttock sites/ hemosiderin staining noted bilateral LE. Edema is mild in the bilateral LEs.  Dressing procedure/placement/frequency: 1. Continue enzymatic debridement ointment to the buttock/sacral wound.  2. Low air loss mattress in place for moisture management and pressure redistribution 3. Add enzymatic debridement to the venous ulceration (ordered per Dr. Albertina Senegal) 4. Patient to receive more effective pressure redistribution cushion for wheelchair. 5. Patient to wear Jobst stockings during the day and off at night 6. Provided patient with antimicrobial stocking information  [facility] Nurse team will follow along with you for weekly wound assessments.  Please notify me of any acute changes in the wounds or any new areas of concerns La Russell MSN, RN,CWOCN, [place], [place]

## 2017-04-04 NOTE — Telephone Encounter (Signed)
Spoke with Reesa Chew, PA-C. She is aware of Libby's response. She will place the order for the sleep study. Nothing further was needed.

## 2017-04-04 NOTE — Progress Notes (Signed)
Speech Language Pathology Session Note & Discharge Summary  Patient Details  Name: [name] MRN: [id number] Date of Birth: 02-20-1955  Today's Date: 04/04/2017 SLP Individual Time: 1130-1155 SLP Individual Time Calculation (min): 25 min   Skilled Therapeutic Interventions:   Skilled treatment session focused on cognitive goals. SLP facilitated session by re-adminsitering the MoCA. Patient scored 30/30 points without any evidence of deficits throughout testing. Suspect patient is at his baseline level of cognitive functioning, therefore, patient will be discharged from skilled SLP intervention. Patient verbalized understanding and agreement.   Patient has met 7 of 7 long term goals.  Patient to discharge at overall Modified Independent level.   Reasons goals not met: N/A   Clinical Impression/Discharge Summary: Patient has made excellent gains and has met 7 of 7 LTG's this admission. Currently, patient is consuming regular textures with thin liquids without overt s/s of aspiration with Mod I for use of swallowing compensatory strategies. Patient is also overall Mod I for complex problem solving, emergent awareness, recall and attention. Patient is at his baseline level of swallowing and cognitive functioning, therefore, patient will be discharged from skilled SLP intervention with f/u not warranted at this time. All education complete.   Care Partner:  Caregiver Able to Provide Assistance: Yes  Type of Caregiver Assistance: Physical  Recommendation:  None      Equipment: N/A   Reasons for discharge: Treatment goals met   Patient/Family Agrees with Progress Made and Goals Achieved: Yes   Function:   Cognition Comprehension Comprehension assist level: Follows complex conversation/direction with extra time/assistive device  Expression   Expression assist level: Expresses complex ideas: With extra time/assistive device  Social Interaction Social Interaction assist level:  Interacts appropriately with others with medication or extra time (anti-anxiety, antidepressant).  Problem Solving Problem solving assist level: Solves complex problems: With extra time  Memory Memory assist level: More than reasonable amount of time   [name] 04/04/2017, 12:52 PM

## 2017-04-04 NOTE — Progress Notes (Signed)
[facility]ICINE & REHABILITATION     PROGRESS NOTE    Subjective/Complaints: Overall feeling well.  Notes some dry cough after using CPAP and when he first wakes up in the morning.  ROS: Patient denies fever, rash, sore throat, blurred vision, nausea, vomiting, diarrhea,   shortness of breath or chest pain, joint or back pain, headache, or mood change.   Objective: Vital Signs: Blood pressure 116/76, pulse (!) 56, temperature (!) 97.5 F (36.4 C), temperature source Oral, resp. rate 17, height 6\' 1"  (1.854 m), weight (!) 139.6 kg (307 lb 12.8 oz), SpO2 96 %. No results found. Recent Labs    04/03/17 0939  WBC 15.9*  HGB 13.6  HCT 42.2  PLT 360   Recent Labs    04/03/17 0939  NA 134*  K 4.0  CL 101  GLUCOSE 101*  BUN 16  CREATININE 0.64  CALCIUM 9.4   CBG (last 3)  Recent Labs    04/03/17 1631 04/03/17 2112 04/04/17 0646  GLUCAP 121* 107* 112*    Wt Readings from Last 3 Encounters:  04/03/17 (!) 139.6 kg (307 lb 12.8 oz)  03/22/17 132.2 kg (291 lb 7.2 oz)  11/07/16 16.5 kg (36 lb 6.4 oz)    Physical Exam:  Constitutional: No distress . Vital signs reviewed. HEENT: EOMI, oral membranes moist Cardiovascular: RRR without murmur. No JVD    Respiratory: CTA Bilaterally without wheezes or rales. Normal effort.  Occasional cough GI: BS +, non-tender, non-distended   Musculoskeletal: He exhibits no edema or tenderness.   LE's with tr to 1+ edema, unchanged Neurological: He is alert and oriented to person, place, and time. No cranial nerve deficit. Reasonable insight and awareness Motor: UE: 4 to 4+/5 prox to distal. senses pain and light touch in all 4's--stable Musc: pain along right greater troch minimal at best Skin: Skin is warm and dry.  Sacral wounds closing with granulation. Area sl macerated/moist still. Foam dressing covering area -Quarter size wound with intermixed fibrin necrotic tissue which is about 25%.  Minimal drainage Psychiatric:  Pleasant and appropriate    Assessment/Plan: 1. Functional and mobility deficits secondary to debility which require 3+ hours per day of interdisciplinary therapy in a comprehensive inpatient rehab setting. Physiatrist is providing close team supervision and 24 hour management of active medical problems listed below. Physiatrist and rehab team continue to assess barriers to discharge/monitor patient progress toward functional and medical goals.  Function:  Bathing Bathing position   Position: Shower  Bathing parts Body parts bathed by patient: Right arm, Left arm, Chest, Abdomen, Front perineal area, Right upper leg, Left upper leg, Buttocks, Right lower leg, Left lower leg Body parts bathed by helper: Left lower leg  Bathing assist Assist Level: Supervision or verbal cues, Set up   Set up : To obtain items  Upper Body Dressing/Undressing Upper body dressing   What is the patient wearing?: Pull over shirt/dress     Pull over shirt/dress - Perfomed by patient: Thread/unthread right sleeve, Thread/unthread left sleeve, Put head through opening, Pull shirt over trunk          Upper body assist Assist Level: Supervision or verbal cues, Set up   Set up : To obtain clothing/put away  Lower Body Dressing/Undressing Lower body dressing   What is the patient wearing?: Non-skid slipper socks     Pants- Performed by patient: Thread/unthread right pants leg, Pull pants up/down, Thread/unthread left pants leg Pants- Performed by helper: Thread/unthread left pants leg  Non-skid slipper socks- Performed by patient: Don/doff right sock, Don/doff left sock Non-skid slipper socks- Performed by helper: Don/doff right sock, Don/doff left sock                  Lower body assist Assist for lower body dressing: Touching or steadying assistance (Pt > 75%)      Toileting Toileting Toileting activity did not occur: No continent bowel/bladder event(used urinal, no BM) Toileting steps  completed by patient: Adjust clothing prior to toileting, Adjust clothing after toileting, Performs perineal hygiene Toileting steps completed by helper: Performs perineal hygiene, Adjust clothing after toileting Toileting Assistive Devices: Grab bar or rail  Toileting assist Assist level: Supervision or verbal cues   Transfers Chair/bed transfer Chair/bed transfer activity did not occur: Safety/medical concerns Chair/bed transfer method: Stand pivot Chair/bed transfer assist level: Supervision or verbal cues Chair/bed transfer assistive device: Medical sales representative Ambulation activity did not occur: Safety/medical concerns   Max distance: 30 ft Assist level: Supervision or verbal cues   Wheelchair Wheelchair activity did not occur: Safety/medical concerns Type: Manual Max wheelchair distance: 20 ft  Assist Level: Supervision or verbal cues  Cognition Comprehension Comprehension assist level: Follows complex conversation/direction with extra time/assistive device  Expression Expression assist level: Expresses complex ideas: With extra time/assistive device  Social Interaction Social Interaction assist level: Interacts appropriately with others with medication or extra time (anti-anxiety, antidepressant).  Problem Solving Problem solving assist level: Solves basic problems with no assist  Memory Memory assist level: More than reasonable amount of time   Medical Problem List and Plan: 1.  Generalized weakness, limitation in self-care secondary to debility.   -continue therapies.  PT, OT, SLP   -Team conference today 2.  New onset A fib/DVT Prophylaxis/Anticoagulation: Pharmaceutical: Other (comment)--Eliquis 3. Pain Management: tylenol prn   -mild right greater troch bursitis,      -much improved with ice/stretches 4. Anxiety disorder/Mood: Continue Zoloft with ativan prn. Team to provide ego   5. Neuropsych: This patient is capable of making decisions on his own  behalf. 6. Skin/Wound Care: Santyl with wet to dry dressing to areas of breakdown on sacrum.    - Encourage pressure relief measures.    -working to keep sacral area a little dryer    -Apply wet-to-dry dressing to right heel.  Patient states that wound itself looks better than prior to hospitalization. 7. Fluids/Electrolytes/Nutrition:     -encourage appropriate po intake. 8. OSA?Acute hypoxic RF: Continue albuterol nbes tid. Oxygen with activity and CPAP at nights. Sleep study post discharge.     -using CPAP most of night    -only on 0.5L oxygen [place]     -Robitussin as needed for cough 9. Acute systolic CHF: Question viral etiology. Monitor weights daily.    -Continue Coreg bid, Lasix bid,   Lipitor and baby ASA.    -BP's continue to be soft.  hold Cozaar 25 and norvasc 5mg  daily for now   [facility] Weights   04/01/17 1350 04/02/17 0500 04/03/17 0523  Weight: (!) 138 kg (304 lb 4.8 oz) (!) 138.3 kg (305 lb) (!) 139.6 kg (307 lb 12.8 oz)     -balanced daily weights 3/25  10. Morbid Obesity: Encourage weight loss to promote health, respiratory status and mobility 11. Hypokalemia: Managed with supplement. wnl 12. New diagnosis T2DM: HGB A1C- 6.3. Monitor BS ac/hs with SSI for elevated BS.    -reasonable control at present 3/26 13. Follicular lymphoma: In remission.  14.  Chronic BLE edema with venous stasis ulcer: removed UNNA boot  LLE    -elevate, observe   -  TEDs.   - Protein/vitamin supplement.  15.  Dysuria with hesitancy:     - urine cx with 100k GNR--- Klebsiella pneumoniae and Proteus mirabilis    -Proteus is sensitive to Bactrim.  Klebsiella ESBL. Spoke with pharmacy yesterday given limited therapeutic options. Will not treat klebsiella and observe for any signs/symptoms of infection. Remains afebrile, bladder emptying. Bactrim completed   -Leukocytosis of 15,000 yesterday.  Recheck CBC tomorrow        LOS (Days) [place] EVALUATION WAS PERFORMED  Meredith Staggers,  MD 04/04/2017 8:40 AM

## 2017-04-04 NOTE — Telephone Encounter (Signed)
Checking on this [name]  

## 2017-04-05 ENCOUNTER — Inpatient Hospital Stay (HOSPITAL_COMMUNITY): Payer: BLUE CROSS/BLUE SHIELD

## 2017-04-05 ENCOUNTER — Inpatient Hospital Stay (HOSPITAL_COMMUNITY): Payer: BLUE CROSS/BLUE SHIELD | Admitting: Speech Pathology

## 2017-04-05 ENCOUNTER — Inpatient Hospital Stay (HOSPITAL_COMMUNITY): Payer: BLUE CROSS/BLUE SHIELD | Admitting: Physical Therapy

## 2017-04-05 LAB — CBC
HCT: 41 % (ref 39.0–52.0)
Hemoglobin: 13.6 g/dL (ref 13.0–17.0)
MCH: 30 pg (ref 26.0–34.0)
MCHC: 33.2 g/dL (ref 30.0–36.0)
MCV: 90.5 fL (ref 78.0–100.0)
Platelets: 318 10*3/uL (ref 150–400)
RBC: 4.53 MIL/uL (ref 4.22–5.81)
RDW: 16.4 % — AB (ref 11.5–15.5)
WBC: 15.6 10*3/uL — AB (ref 4.0–10.5)

## 2017-04-05 LAB — GLUCOSE, CAPILLARY
Glucose-Capillary: 102 mg/dL — ABNORMAL HIGH (ref 65–99)
Glucose-Capillary: 105 mg/dL — ABNORMAL HIGH (ref 65–99)
Glucose-Capillary: 104 mg/dL — ABNORMAL HIGH (ref 65–99)
Glucose-Capillary: 121 mg/dL — ABNORMAL HIGH (ref 65–99)

## 2017-04-05 MED ORDER — SERTRALINE HCL 50 MG PO TABS: 50.0000 mg | ORAL_TABLET | Freq: Every day | ORAL | 0 refills | Status: DC

## 2017-04-05 MED ORDER — ASCORBIC ACID 500 MG PO TABS: 500.0000 mg | ORAL_TABLET | Freq: Three times a day (TID) | ORAL | Status: DC

## 2017-04-05 MED ORDER — POTASSIUM CHLORIDE CRYS ER 20 MEQ PO TBCR: 40.0000 meq | EXTENDED_RELEASE_TABLET | Freq: Two times a day (BID) | ORAL | 0 refills | Status: DC

## 2017-04-05 MED ORDER — SALINE SPRAY 0.65 % NA SOLN
2.0000 | NASAL | 0 refills | Status: DC | PRN
Start: 2017-04-05 — End: 2020-10-05

## 2017-04-05 MED ORDER — ATORVASTATIN CALCIUM 20 MG PO TABS: 20.0000 mg | ORAL_TABLET | Freq: Every day | ORAL | 0 refills | Status: DC

## 2017-04-05 MED ORDER — ALBUTEROL SULFATE HFA 108 (90 BASE) MCG/ACT IN AERS: 2.0000 | INHALATION_SPRAY | Freq: Four times a day (QID) | RESPIRATORY_TRACT | 2 refills | Status: DC | PRN

## 2017-04-05 MED ORDER — PANTOPRAZOLE SODIUM 40 MG PO PACK: 40.0000 mg | PACK | Freq: Every day | ORAL | 0 refills | Status: DC

## 2017-04-05 MED ORDER — LOSARTAN POTASSIUM 25 MG PO TABS: 25.0000 mg | ORAL_TABLET | Freq: Every day | ORAL | 0 refills | Status: DC

## 2017-04-05 MED ORDER — FUROSEMIDE 40 MG PO TABS: 60.0000 mg | ORAL_TABLET | Freq: Two times a day (BID) | ORAL | 0 refills | Status: DC

## 2017-04-05 MED ORDER — APIXABAN 5 MG PO TABS: 5.0000 mg | ORAL_TABLET | Freq: Two times a day (BID) | ORAL | 0 refills | Status: DC

## 2017-04-05 MED ORDER — PANTOPRAZOLE SODIUM 40 MG PO TBEC: 40.0000 mg | DELAYED_RELEASE_TABLET | Freq: Every day | ORAL | 0 refills | Status: DC

## 2017-04-05 MED ORDER — CARVEDILOL 25 MG PO TABS: 25.0000 mg | ORAL_TABLET | Freq: Two times a day (BID) | ORAL | 0 refills | Status: DC

## 2017-04-05 MED ORDER — COLLAGENASE 250 UNIT/GM EX OINT: TOPICAL_OINTMENT | Freq: Every day | CUTANEOUS | 0 refills | Status: DC

## 2017-04-05 NOTE — Progress Notes (Signed)
[facility]ICAL MEDICINE & REHABILITATION     PROGRESS NOTE    Subjective/Complaints: Continues to make progress.  Please that he is going home tomorrow.  Has noted some dysuria however.  ROS: Patient denies fever, rash, sore throat, blurred vision, nausea, vomiting, diarrhea, cough, shortness of breath or chest pain, joint or back pain, headache, or mood change.   Objective: Vital Signs: Blood pressure 107/75, pulse 86, temperature (!) 97.4 F (36.3 C), temperature source Oral, resp. rate 18, height 6\' 1"  (1.854 m), weight 135 kg (297 lb 11.2 oz), SpO2 95 %. No results found. Recent Labs    04/03/17 0939 04/05/17 0744  WBC 15.9* 15.6*  HGB 13.6 13.6  HCT 42.2 41.0  PLT 360 318   Recent Labs    04/03/17 0939  NA 134*  K 4.0  CL 101  GLUCOSE 101*  BUN 16  CREATININE 0.64  CALCIUM 9.4   CBG (last 3)  Recent Labs    04/04/17 1639 04/04/17 2059 04/05/17 0655  GLUCAP 116* 136* 102*    Wt Readings from Last 3 Encounters:  04/05/17 135 kg (297 lb 11.2 oz)  03/22/17 132.2 kg (291 lb 7.2 oz)  11/07/16 16.5 kg (36 lb 6.4 oz)    Physical Exam:  Constitutional: No distress . Vital signs reviewed. HEENT: EOMI, oral membranes moist Cardiovascular: RRR without murmur. No JVD    Respiratory: CTA Bilaterally without wheezes or rales. Normal effort    GI: BS +, non-tender, non-distended   Musculoskeletal: He exhibits no edema or tenderness.   LE's with tr to 1+ edema, unchanged Neurological: He is alert and oriented to person, place, and time. No cranial nerve deficit. Reasonable insight and awareness Motor: UE: 4 to 4+/5 prox to distal. senses pain and light touch in all 4's--stable Musc: pain along right greater troch minimal at best Skin: Skin is warm and dry.  Sacral wounds closing with granulation with foam dressing -Quarter size wound with intermixed fibro- necrotic tissue which is about 20-25%.  Minimal drainage Psychiatric: Pleasant and  appropriate    Assessment/Plan: 1. Functional and mobility deficits secondary to debility which require 3+ hours per day of interdisciplinary therapy in a comprehensive inpatient rehab setting. Physiatrist is providing close team supervision and 24 hour management of active medical problems listed below. Physiatrist and rehab team continue to assess barriers to discharge/monitor patient progress toward functional and medical goals.  Function:  Bathing Bathing position   Position: Shower  Bathing parts Body parts bathed by patient: Right arm, Left arm, Chest, Abdomen, Front perineal area, Right upper leg, Left upper leg, Buttocks, Right lower leg, Left lower leg Body parts bathed by helper: Left lower leg  Bathing assist Assist Level: Supervision or verbal cues, Set up   Set up : To obtain items  Upper Body Dressing/Undressing Upper body dressing   What is the patient wearing?: Pull over shirt/dress     Pull over shirt/dress - Perfomed by patient: Thread/unthread right sleeve, Thread/unthread left sleeve, Put head through opening, Pull shirt over trunk          Upper body assist Assist Level: Supervision or verbal cues, Set up   Set up : To obtain clothing/put away  Lower Body Dressing/Undressing Lower body dressing   What is the patient wearing?: Non-skid slipper socks     Pants- Performed by patient: Thread/unthread right pants leg, Pull pants up/down, Thread/unthread left pants leg Pants- Performed by helper: Thread/unthread left pants leg Non-skid slipper socks- Performed by  patient: Don/doff right sock, Don/doff left sock Non-skid slipper socks- Performed by helper: Don/doff right sock, Don/doff left sock                  Lower body assist Assist for lower body dressing: Touching or steadying assistance (Pt > 75%)      Toileting Toileting Toileting activity did not occur: No continent bowel/bladder event(used urinal, no BM) Toileting steps completed by  patient: Adjust clothing prior to toileting, Adjust clothing after toileting, Performs perineal hygiene Toileting steps completed by helper: Performs perineal hygiene, Adjust clothing after toileting Toileting Assistive Devices: Grab bar or rail  Toileting assist Assist level: Supervision or verbal cues   Transfers Chair/bed transfer Chair/bed transfer activity did not occur: Safety/medical concerns Chair/bed transfer method: Stand pivot Chair/bed transfer assist level: Supervision or verbal cues Chair/bed transfer assistive device: Medical sales representative Ambulation activity did not occur: Safety/medical concerns   Max distance: 31 ft Assist level: Supervision or verbal cues   Wheelchair Wheelchair activity did not occur: Safety/medical concerns Type: Motorized Max wheelchair distance: 150ft Assist Level: Supervision or verbal cues  Cognition Comprehension Comprehension assist level: Follows complex conversation/direction with extra time/assistive device  Expression Expression assist level: Expresses complex ideas: With extra time/assistive device  Social Interaction Social Interaction assist level: Interacts appropriately with others with medication or extra time (anti-anxiety, antidepressant).  Problem Solving Problem solving assist level: Solves complex problems: With extra time  Memory Memory assist level: More than reasonable amount of time   Medical Problem List and Plan: 1.  Generalized weakness, limitation in self-care secondary to debility.   -continue therapies.  PT, OT, SLP   -Finalizing discharge planning for tomorrow  -Patient to see Rehab MD/provider in the office for transitional care encounter in 1-2 weeks.  2.  New onset A fib/DVT Prophylaxis/Anticoagulation: Pharmaceutical: Other (comment)--Eliquis 3. Pain Management: tylenol prn   -mild right greater troch bursitis,      -much improved with ice/stretches 4. Anxiety disorder/Mood: Continue Zoloft with  ativan prn. Team to provide ego   5. Neuropsych: This patient is capable of making decisions on his own behalf. 6. Skin/Wound Care: Santyl with wet to dry dressing to areas of breakdown on sacrum.    - Encourage pressure relief measures.    -working to keep sacral area a little dryer    -Apply wet-to-dry dressing to right heel.  Patient states that wound itself looks better than prior to hospitalization. 7. Fluids/Electrolytes/Nutrition:     -encourage appropriate po intake. 8. OSA?Acute hypoxic RF: Continue albuterol nbes tid. Oxygen with activity and CPAP at nights. Sleep study post discharge.     -using CPAP at night.  Working on getting a sleep study set up as outpatient    -Off oxygen now     -Robitussin as needed for cough 9. Acute systolic CHF: Question viral etiology. Monitor weights daily.    -Continue Coreg bid, Lasix bid,   Lipitor and baby ASA.    -BP's continue to be soft.  hold Cozaar 25 and norvasc 5mg  daily for now --- potentially resume on an outpatient basis Filed Weights   04/03/17 0523 04/04/17 1500 04/05/17 0500  Weight: (!) 139.6 kg (307 lb 12.8 oz) (!) 139.5 kg (307 lb 9 oz) 135 kg (297 lb 11.2 oz)     -balanced daily weights 3/25  10. Morbid Obesity: Encourage weight loss to promote health, respiratory status and mobility 11. Hypokalemia: Managed with supplement. wnl 12. New diagnosis T2DM:  HGB A1C- 6.3. Monitor BS ac/hs with SSI for elevated BS.    -reasonable control at present 3/26 13. Follicular lymphoma: In remission.  14. Chronic BLE edema with venous stasis ulcer: removed UNNA boot  LLE    -elevate, observe   -  TEDs.   - Protein/vitamin supplement.  15.  Dysuria with hesitancy:     - recent urine cx with 100k GNR--- Klebsiella pneumoniae and Proteus mirabilis    -bactrim rx completed.    -Leukocytosis of 15,000 yesterday.  Repeat 15.6 today   -urine culture pending.         LOS (Days) [place] EVALUATION WAS PERFORMED  Meredith Staggers, MD 04/05/2017 8:32 AM

## 2017-04-05 NOTE — Progress Notes (Signed)
Occupational Therapy Discharge Summary  Patient Details  Name: [name] MRN: [id number] Date of Birth: [birth year]  Today's Date: 04/05/2017 OT Individual Time: 1300-1400 OT Individual Time Calculation (min): 60 min    Patient has met 4 of 8 long term goals due to improved activity tolerance, improved balance, postural control, improved attention and improved awareness.  Pt has demonstrated increased safety awareness during ADL transfers, as well as self-monitoring skills, exhibiting anticipatory knowledge in using energy conservation techniques and activity pacing.  Pt made great progress in weaning off O2 and now can maintain sufficient O2 levels during physical exertion with adequate rest breaks and activity pacing. Patient to discharge at overall Supervision level.  Patient's care partner is independent to provide the necessary physical assistance at discharge.    Reasons goals not met: Pt is still limited by limited functional activity tolerance in standing, leading to him requiring (S) during most ADL transfers and standing level ADL tasks. Pt will receive this (S) from his close family friend who plans to stay with him for several weeks post d/c.   Recommendation:  Patient will benefit from ongoing skilled OT services in home health setting to continue to advance functional skills in the area of BADL and iADL and to address any barriers in the home.  Equipment: BSC, RW, manual w/c. Pt has already purchased a pulse oximeter in order to move accurately measure exertion levels during higher level ADL/IADL tasks.   Reasons for discharge: treatment goals met and discharge from hospital  Patient/family agrees with progress made and goals achieved: Yes  OT Discharge Precautions/Restrictions  Precautions Precautions: Fall Restrictions Weight Bearing Restrictions: No  Pain Pain Assessment Pain Scale: 0-10 Pain Score: 0-No pain Faces Pain Scale: No hurt ADL ADL ADL Comments: see  functional navigator Vision Baseline Vision/History: No visual deficits Patient Visual Report: No change from baseline Perception  Perception: Within Functional Limits Praxis Praxis: Intact Cognition Overall Cognitive Status: Within Functional Limits for tasks assessed Orientation Level: Oriented X4 Attention: Alternating Selective Attention: Appears intact Alternating Attention: Appears intact Memory: Appears intact Awareness: Appears intact Problem Solving: Appears intact Problem Solving Impairment: Functional complex;Verbal complex Executive Function: Reasoning;Self Correcting;Self Monitoring Reasoning: Appears intact Sequencing: Appears intact Organizing: Appears intact Decision Making: Appears intact Self Monitoring: Appears intact Self Correcting: Appears intact Safety/Judgment: Appears intact Sensation Sensation Light Touch: Impaired Detail(B LE sensation impaired by neuropathy) Light Touch Impaired Details: Impaired LLE;Impaired RLE Proprioception: Appears Intact Coordination Gross Motor Movements are Fluid and Coordinated: Yes Fine Motor Movements are Fluid and Coordinated: Yes Motor  Motor Motor: Other (comment) Motor - Skilled Clinical Observations: Pt still presents with endurance deficits that limit extensive standing and walking Mobility  Bed Mobility Bed Mobility: Sit to Supine;Supine to Sit Rolling Right: 6: Modified independent (Device/Increase time) Rolling Left: 6: Modified independent (Device/Increase time) Supine to Sit: 6: Modified independent (Device/Increase time) Sit to Supine: 6: Modified independent (Device/Increase time) Scooting to [facility]: 6: Modified independent (Device/Increase time) Transfers Transfers: Sit to Stand;Stand to Sit Sit to Stand: 5: Supervision Sit to Stand Details: Verbal cues for precautions/safety Stand to Sit: 5: Supervision Stand to Sit Details (indicate cue type and reason): Verbal cues for precautions/safety   Trunk/Postural Assessment  Cervical Assessment Cervical Assessment: Within Functional Limits Thoracic Assessment Thoracic Assessment: Within Functional Limits Lumbar Assessment Lumbar Assessment: Within Functional Limits Postural Control Postural Control: Within Functional Limits  Balance Balance Balance Assessed: No Dynamic Sitting Balance Sitting balance - Comments: Pt able to maintain dynamic sitting balance, reaching  outside of BOS with no LOB Static Standing Balance Static Standing - Balance Support: Bilateral upper extremity supported Static Standing - Level of Assistance: 5: Stand by assistance Dynamic Standing Balance Dynamic Standing - Balance Support: Bilateral upper extremity supported;During functional activity Dynamic Standing - Level of Assistance: 5: Stand by assistance Dynamic Standing - Balance Activities: Wii Extremity/Trunk Assessment  Pt has limitations in his L UE shoulder flexion but it does not affect him functionally.     See Function Navigator for Current Functional Status.  Curtis Sites 04/05/2017, 2:19 PM

## 2017-04-05 NOTE — Progress Notes (Signed)
CPAP within reach at bedside.  RT filled chamber with sterile water.  Wife at bedside states she will put husband on when ready.

## 2017-04-05 NOTE — Discharge Summary (Signed)
Physician Discharge Summary  Patient ID: [name] MRN: [id number] DOB/AGE: 1955/11/05 62 y.o.  Admit date: 03/22/2017 Discharge date: 04/10/2017  Discharge Diagnoses:  Principal Problem:   Debility Active Problems:   Pressure injury of skin   Persistent atrial fibrillation (HCC)   Non-Hodgkin's lymphoma (HCC)   Benign essential HTN   OSA (obstructive sleep apnea)   Morbid obesity ([place])   Diabetes mellitus, new onset ([place])   Discharged Condition: stable   Significant Diagnostic Studies: Dg Chest Port 1 View  Result Date: 03/13/2017 CLINICAL DATA:  Patient diagnosed with pneumonia approximately 1 month ago. No current complaints. EXAM: PORTABLE CHEST 1 VIEW COMPARISON:  Single-view of the chest 03/01/2017, 02/28/2017 and 02/16/2017. FINDINGS: Tracheostomy tube, feeding tube, left subclavian catheter and right PICC are unchanged since the most recent examination. Patchy bilateral airspace disease persists. Aeration shows some improvement since 02/27/2017 but is unchanged since the most recent exam. There is cardiomegaly. No pneumothorax. IMPRESSION: Patchy bilateral airspace disease appears unchanged since the most recent study but has improved somewhat since 02/27/2017. Electronically Signed   By: Inge Rise M.D.   On: 03/13/2017 16:34    Labs:  Basic Metabolic Panel: BMP Latest Ref Rng & Units 04/03/2017 03/27/2017 03/23/2017  Glucose 65 - 99 mg/dL 101(H) 114(H) 115(H)  BUN 6 - 20 mg/dL 16 22(H) 19  Creatinine 0.61 - 1.24 mg/dL 0.64 0.81 0.69  Sodium 135 - 145 mmol/L 134(L) 134(L) 132(L)  Potassium 3.5 - 5.1 mmol/L 4.0 4.2 4.2  Chloride 101 - 111 mmol/L 101 98(L) 95(L)  CO2 22 - 32 mmol/L 21(L) 26 26  Calcium 8.9 - 10.3 mg/dL 9.4 8.9 8.7(L)    CBC: CBC Latest Ref Rng & Units 04/05/2017 04/03/2017 03/23/2017  WBC 4.0 - 10.5 K/uL 15.6(H) 15.9(H) 12.6(H)  Hemoglobin 13.0 - 17.0 g/dL 13.6 13.6 12.2(L)  Hematocrit 39.0 - 52.0 % 41.0 42.2 37.8(L)  Platelets 150 - 400 K/uL 318  360 272    CBG: Recent Labs  Lab 04/05/17 0655 04/05/17 1132 04/05/17 1642 04/05/17 2056 04/06/17 0624  GLUCAP 102* 105* 104* 121* 106*    Brief HPI:   [name] is a 62 y.o. male with history of Non- Hodgkin's lymphoma -in remission, HTN,  PVD with bilateral foot wounds, morbid obesity who was admitted on 02/16/17 with ARDS due influenza A with sepsis. He was found to have cardiomyopathy with EF 25-30% and new onset of acute systolic CHF. He was intubation on 2/9 due to volume overload and ultimately required  tracheostomy on 2/14. Hypoxia felt to be due to mix of ALI/ARDS after flu and atelectasis d/t body habitus.   Dr. Percival Spanish recommended Eliquis due to new onset of AFib.  recommended due to CHA2DS2VASc - 4.  He was weaned off vent and underwent decannulation on 3/13. He has had desaturation on overnight pulse oximetry and PCCM feels that patient will likely need CPAP at nights. WOC following for input on foot wounds as well as MASD on sacrum/buttocks.  Activity levels improving but patient noted to be debilitated and CIR was recommended due to debility.     Hospital Course: [name] was admitted to rehab 03/22/2017 for inpatient therapies to consist of PT, ST and OT at least three hours five days a week. Past admission physiatrist, therapy team and rehab RN have worked together to provide customized collaborative inpatient rehab. His respiratory status has improved and he was weaned off oxygen by discharge. Team has provided ego support and anxiety levels  are controlled on Zoloft. Weights were monitored daily and are down to 297 lbs. Blood pressures were monitored on bid basis.  Cozaar and Norvasc were placed on hold due to orthostatic symptoms past admission. Cozaar was resumed at discharge as blood pressures have improved.     He reported dysuria at admission and was found to have ESBL Klebsiella and Proteus UTI. Antibiotic choice were discussed with pharmacy and He was treated  with Bactrim which was sensitive to Proteus. Due to persistent leucocytosis, UCS was repeated showing 100K ESBL Klebisella. He was started on Fosphomycin qod  X 3 doses at discharge.  Right hip pain due to greater trochanteric bursitis and this has improved with stretches and ice. He has been worked on improving tolerance of  CPAP and is currently able to tolerate it for about 8 hours at nights.  Air mattress was used for pressure relief measures and for management of MASD. Santyl with wet to dry dressing was used to help with debridement of yellow eschar.  Chronic ulcer right ankle has been treated with santyl and specialized TEDs  Blood sugars were monitored on ac/hs basis due to prediabetes and he has been educated on heart healthy/carb modified diet to help promote weigh loss as well as manage BS. Serial check of lytes revealed that hyponatremia is slowly improving and AKI has resolved.  CBC showed H/H is stable on Eliquis. He has made steady progress during his rehab stay and has progressed to supervision level. He will continue to receive follow up HHPT and [place] and [place] by [place] after discharge.    Rehab course: During patient's stay in rehab weekly team conferences were held to monitor patient's progress, set goals and discuss barriers to discharge. At admission, patient required min assist with basic self care tasks and mobility. He demonstrated mild swallowing deficits with deficits in complex problem solving, recall and awareness. He  has had improvement in activity tolerance, balance, postural control as well as ability to compensate for deficits.  He is able to complete ADL tasks with supervision. He is modified independent for transfers and requires supervision to ambulate 44' with RW.  Swallow function has improved and he was upgraded to regular textures. Family education was completed regarding all aspects of care.    Disposition:  Home  Diet: Heart  Healthy/Carb modified.    Special Instructions: 1. No driving or strenuous activity till cleared by MD.  2. Use santyl with damp to dry dressing to yellow eschar to areas on buttocks. Change dressing daily.    Discharge Instructions    Split night study   Complete by:  Apr 07, 2017    Where should this test be performed:  [facility] - Pul[facility]   Ambulatory referral to Physical Medicine Rehab   Complete by:  As directed    1-2 weeks transitional care appt   Ambulatory referral to Sleep Studies   Complete by:  As directed    Needs study in 1-2 weeks/discharge set for 04/06/17     Allergies as of 04/06/2017      Reactions   Penicillins Swelling   Age 57 or 24 - pt states he had swelling all over and his lips got real big      Medication List    STOP taking these medications   ADVIL 200 MG Caps Generic drug:  Ibuprofen   albuterol (2.5 MG/3ML) 0.083% nebulizer solution Commonly known as:  PROVENTIL Replaced by:  albuterol 108 (90 Base) MCG/ACT inhaler  allopurinol 300 MG tablet Commonly known as:  ZYLOPRIM   amLODipine 5 MG tablet Commonly known as:  NORVASC   aspirin 81 MG chewable tablet   butalbital-acetaminophen-caffeine 50-325-40 MG tablet Commonly known as:  FIORICET, ESGIC   CONTRAVE 8-90 MG Tb12 Generic drug:  Naltrexone-buPROPion HCl ER   FISH OIL PO   insulin aspart 100 UNIT/ML injection Commonly known as:  novoLOG   loperamide 2 MG capsule Commonly known as:  IMODIUM   LORazepam 1 MG tablet Commonly known as:  ATIVAN   multivitamin-iron-minerals-folic acid chewable tablet Replaced by:  multivitamin with minerals Tabs tablet   mupirocin cream 2 % Commonly known as:  BACTROBAN   ondansetron 4 MG tablet Commonly known as:  ZOFRAN   oxyCODONE 5 MG/5ML solution Commonly known as:  ROXICODONE   pantoprazole sodium 40 mg/20 mL Pack Commonly known as:  PROTONIX Replaced by:  pantoprazole 40 MG tablet   potassium chloride 10 MEQ tablet Commonly known as:  K-DUR   VITAMIN  D-3 PO     TAKE these medications   acetaminophen 160 MG/5ML solution Commonly known as:  TYLENOL Take 20.3 mLs (650 mg total) by mouth every 6 (six) hours as needed for mild pain or fever.   albuterol 108 (90 Base) MCG/ACT inhaler Commonly known as:  PROVENTIL HFA;VENTOLIN HFA Inhale 2 puffs into the lungs every 6 (six) hours as needed for wheezing or shortness of breath. Replaces:  albuterol (2.5 MG/3ML) 0.083% nebulizer solution   apixaban 5 MG Tabs tablet Commonly known as:  ELIQUIS Take 1 tablet (5 mg total) by mouth 2 (two) times daily.   ascorbic acid 500 MG tablet Commonly known as:  VITAMIN C Take 1 tablet (500 mg total) by mouth 3 (three) times daily.   atorvastatin 20 MG tablet Commonly known as:  LIPITOR Take 1 tablet (20 mg total) by mouth daily.   carvedilol 25 MG tablet Commonly known as:  COREG Take 1 tablet (25 mg total) by mouth 2 (two) times daily with a meal.   collagenase ointment Commonly known as:  SANTYL Apply topically daily.   febuxostat 40 MG tablet Commonly known as:  ULORIC Take 1 tablet (40 mg total) by mouth daily.   fosfomycin 3 g Pack Commonly known as:  MONUROL Take 3 g by mouth every other day. Starting Saturday   furosemide 40 MG tablet Commonly known as:  LASIX Take 1.5 tablets (60 mg total) by mouth 2 (two) times daily. What changed:  medication strength   losartan 25 MG tablet Commonly known as:  COZAAR Take 1 tablet (25 mg total) by mouth daily.   Melatonin 3 MG Tabs Take 2 tablets (6 mg total) by mouth at bedtime.   multivitamin with minerals Tabs tablet Take 1 tablet by mouth daily. Replaces:  multivitamin-iron-minerals-folic acid chewable tablet   pantoprazole 40 MG tablet Commonly known as:  PROTONIX Take 1 tablet (40 mg total) by mouth daily. Replaces:  pantoprazole sodium 40 mg/20 mL Pack   potassium chloride SA 20 MEQ tablet Commonly known as:  K-DUR,KLOR-CON Take 2 tablets (40 mEq total) by mouth 2 (two)  times daily.   sertraline 50 MG tablet Commonly known as:  ZOLOFT Take 1 tablet (50 mg total) by mouth daily.   sodium chloride 0.65 % Soln nasal spray Commonly known as:  OCEAN Place 2 sprays into both nostrils as needed for congestion.      Follow-up Information    Meredith Staggers, MD Follow up.  Specialty:  Physical Medicine and Rehabilitation Why:  office will call you with follow up appointment Contact information: [address] [place] [address] [phone]        Chesley Mires, MD Follow up.   Specialty:  Pulmonary Disease Why:  Needs appointm Contact information: [address] [place] [address] [phone]        [name], MD Follow up.   Specialty:  Internal Medicine Contact information: [place] [address] [phone]        Minus Breeding, MD. Call.   Specialty:  Cardiology Why:  for follow up appointment in 2-3 weeks Contact information: [place] [address] [phone]           Signed: Bary Leriche 04/10/2017, 7:30 PM

## 2017-04-05 NOTE — Progress Notes (Signed)
Completed education with family member. Family member demonstrated dressing change.

## 2017-04-05 NOTE — Progress Notes (Signed)
Physical Therapy Discharge Summary  Patient Details  Name: [name] MRN: [id number] Date of Birth: Nov 30, 1955  Today's Date: 04/05/2017 PT Individual Time: 0800-0910 PT Individual Time Calculation (min): 70 min    Patient has met 8 of 8 long term goals due to improved activity tolerance, improved balance, improved postural control and increased strength.  Patient to discharge at a wheelchair level Supervision.   Patient's care partner is independent to provide the necessary physical assistance at discharge.  Pt's family has completed family training and are able to provide supervision assistance level to patient with transfers and functional mobility at home. Session focus on bed mobility, transfers with RW, w/c propulsion, gait x 55 ft with RW, stairs, and car transfer. Pt is Mod I with bed mobility, able to perform transfers and gait with RW with Supervision, and is supervision for stairs with 2 handrails and Supervision. Pt is able to propel w/c x 100 ft before fatigue with Supervision. Pt on room air throughout therapy session with SpO2 95% and above. Pt reports no pain this date. Pt left supine in bed with needs in reach and family present at end of therapy session.  Reasons goals not met: Pt has met all goals.  Recommendation:  Patient will benefit from ongoing skilled PT services in home health setting to continue to advance safe functional mobility, address ongoing impairments in endurance and independence with functional mobility, and minimize fall risk.  Equipment: Wheelchair, bariatric RW  Reasons for discharge: treatment goals met and discharge from hospital  Patient/family agrees with progress made and goals achieved: Yes  PT Discharge Precautions/Restrictions Precautions Precautions: Fall Restrictions Weight Bearing Restrictions: No Pain Pain Assessment Pain Scale: 0-10 Pain Score: 0-No pain Vision/Perception  Perception Perception: Within Functional  Limits Praxis Praxis: Intact  Cognition Overall Cognitive Status: Within Functional Limits for tasks assessed Orientation Level: Oriented X4 Attention: Alternating Selective Attention: Appears intact Alternating Attention: Appears intact Memory: Appears intact Awareness: Appears intact Problem Solving: Appears intact Safety/Judgment: Appears intact Sensation Sensation Light Touch: Appears Intact Proprioception: Appears Intact Coordination Gross Motor Movements are Fluid and Coordinated: Yes Motor  Motor Motor: Other (comment) Motor - Skilled Clinical Observations: poor endurance, generalized weakness  Mobility Bed Mobility Rolling Right: 6: Modified independent (Device/Increase time) Rolling Left: 6: Modified independent (Device/Increase time) Supine to Sit: 6: Modified independent (Device/Increase time) Scooting to [facility]: 6: Modified independent (Device/Increase time) Transfers Transfers: Yes Sit to Stand: 5: Supervision Sit to Stand Details: Verbal cues for technique;Verbal cues for sequencing;Manual facilitation for placement;Verbal cues for precautions/safety;Verbal cues for safe use of DME/AE Stand to Sit: 5: Supervision Stand to Sit Details (indicate cue type and reason): Verbal cues for precautions/safety;Verbal cues for safe use of DME/AE Locomotion  Ambulation Ambulation: Yes Ambulation/Gait Assistance: 5: Supervision Ambulation Distance (Feet): 55 Feet Assistive device: Rolling walker Ambulation/Gait Assistance Details: Verbal cues for precautions/safety;Verbal cues for safe use of DME/AE Gait Gait: Yes Gait Pattern: Impaired Gait Pattern: Decreased step length - right;Decreased step length - left Gait velocity: decreased Stairs / Additional Locomotion Stairs: Yes Stairs Assistance: 5: Supervision Stairs Assistance Details: Verbal cues for precautions/safety;Verbal cues for safe use of DME/AE Stair Management Technique: Two rails Number of Stairs: 6 Height  of Stairs: 6 Curb: 5: Psychiatric nurse: Yes Wheelchair Assistance: 5: Investment banker, operational Details: Verbal cues for safe use of DME/AE Wheelchair Propulsion: Both upper extremities Wheelchair Parts Management: Independent Distance: 100  Trunk/Postural Assessment  Cervical Assessment Cervical Assessment: Within Water engineer  Thoracic Assessment: Within Functional Limits Lumbar Assessment Lumbar Assessment: Within Functional Limits  Balance Balance Balance Assessed: Yes Static Standing Balance Static Standing - Balance Support: Bilateral upper extremity supported Static Standing - Level of Assistance: 5: Stand by assistance Dynamic Standing Balance Dynamic Standing - Balance Support: Bilateral upper extremity supported;During functional activity Dynamic Standing - Level of Assistance: 5: Stand by assistance Dynamic Standing - Balance Activities: Forward lean/weight shifting Extremity Assessment   RLE Assessment RLE Assessment: Within Functional Limits LLE Assessment LLE Assessment: Within Functional Limits   See Function Navigator for Current Functional Status.  Excell Seltzer, PT, DPT  04/05/2017, 12:24 PM

## 2017-04-05 NOTE — Progress Notes (Signed)
Occupational Therapy Session Note  Patient Details  Name: [name] MRN: [id number] Date of Birth: Dec 09, 1955  Today's Date: 04/05/2017 OT Individual Time: 1000-1100, Session 2: 1300-1400 OT Individual Time Calculation (min): 60 min, Session 2: 27mn   Short Term Goals: Week 1:  OT Short Term Goal 1 (Week 1): Pt will complete all toileting tasks with CGA OT Short Term Goal 2 (Week 1): Pt will don socks with CGA and no more than 2 rest breaks OT Short Term Goal 3 (Week 1): Pt will stand at sink during oral care for 2 minutes with no rest break OT Short Term Goal 4 (Week 1): Pt will navigate around room with RW safely to obtain clothing  Skilled Therapeutic Interventions/Progress Updates:    Session 1: Pt received sitting up in bed with no c/o pain and agreeable to therapy. Discussion with pt re d/c planning and remaining OT goals to assess. Pt completed toilet transfer with BSC over toilet with (S). Pt then propelled w/c 2028fto ADL suite, requiring 3 rest breaks during propulsion. Pt completed functional reaching standing in kitchen with RW, opening cabinets above and below eye level. Pt then practiced tub transfer with bench, requiring (S) and occasional vc re safe use of DME. Education provided re energy conservation strategies during bathing and prioritizing the day based on strenuous tasks. Pt was returned to room and transferred to bed with (S). Pt left with all needs met.   Session 2: Pt received sitting up in bed with no c/o pain and agreeable to therapy. Pt transferred to w/c with (S). Pt brought down to therapy gym and set up to play Wii with his close friend. Pt remained standing for 10 min with no rest break. Upon sitting, pt's SpO2 was 93%. Additional education provided re monitoring of O2 during IADL tasks, with pt stating he had a pulse oximeter waiting at home for this purpose. Pt then completed rest of Wii game with 4 sit to stand transfers and RW with (S). Pt then requested to  use NuStep. Pt required vc for sequencing transfer to machine, requiring (S) overall. Pt then completed 2 sets of 5 min intervals on the NuStep, requiring an extended rest break in between, with an SpO2 reading of 91% at end of each interval. Vc provided for breathing technique. Discussion with pt and friend re community re-entry, engagement in [place], and the importance of staying active and living a healthy lifestyle. Pt extremely thankful and responsive to all education. Pt returned to supine with all needs met.   Therapy Documentation Precautions:  Precautions Precautions: Fall Precaution Comments: supplemental oxygen Restrictions Weight Bearing Restrictions: No Pain: Pain Assessment Pain Scale: 0-10 Pain Score: 0-No pain Faces Pain Scale: No hurt ADL: ADL ADL Comments: see functional navigator Perception  Perception: Within Functional Limits Praxis Praxis: Intact See Function Navigator for Current Functional Status.   Therapy/Group: Individual Therapy  SaCurtis Sites/27/2019, 1:45 PM

## 2017-04-06 DIAGNOSIS — R5381 Other malaise: Secondary | ICD-10-CM | POA: Diagnosis not present

## 2017-04-06 DIAGNOSIS — L089 Local infection of the skin and subcutaneous tissue, unspecified: Secondary | ICD-10-CM | POA: Diagnosis not present

## 2017-04-06 DIAGNOSIS — Z1612 Extended spectrum beta lactamase (ESBL) resistance: Secondary | ICD-10-CM

## 2017-04-06 DIAGNOSIS — N390 Urinary tract infection, site not specified: Secondary | ICD-10-CM | POA: Insufficient documentation

## 2017-04-06 DIAGNOSIS — T148XXA Other injury of unspecified body region, initial encounter: Secondary | ICD-10-CM | POA: Diagnosis not present

## 2017-04-06 DIAGNOSIS — B9629 Other Escherichia coli [E. coli] as the cause of diseases classified elsewhere: Secondary | ICD-10-CM | POA: Insufficient documentation

## 2017-04-06 LAB — URINE CULTURE: Culture: >=100000 — AB

## 2017-04-06 LAB — GLUCOSE, CAPILLARY: GLUCOSE-CAPILLARY: 106 mg/dL — AB (ref 65–99)

## 2017-04-06 MED ORDER — FOSFOMYCIN TROMETHAMINE 3 G PO PACK
3.0000 g | PACK | Freq: Once | ORAL | Status: AC
  Administered 2017-04-06: 3 g via ORAL
  Filled 2017-04-06: qty 3

## 2017-04-06 MED ORDER — FOSFOMYCIN TROMETHAMINE 3 G PO PACK: 3.0000 g | PACK | ORAL | Status: DC

## 2017-04-06 MED ORDER — FOSFOMYCIN TROMETHAMINE 3 G PO PACK: 3.0000 g | PACK | ORAL | 0 refills | Status: DC

## 2017-04-06 NOTE — Progress Notes (Signed)
Social Work  Discharge Note  The overall goal for the admission was met for:   Discharge location: Yes - home with sister and friend providing 24/7 supervision  Length of Stay: Yes - 15 days  Discharge activity level: Yes - supervision to mod independent  Home/community participation: Yes  Services provided included: MD, RD, PT, OT, SLP, RN, TR, Pharmacy and [place]: Private Insurance: [place]  Follow-up services arranged: Home Health: RN, PT, OT via [place], DME: 22x18 lighweight w/c, basic cushion, wide rolling walker, wide 3n1 commode, tub bench, CPAP all via [place] and Patient/Family has no preference for HH/DME agencies  Comments (or additional information):  Patient/Family verbalized understanding of follow-up arrangements: Yes  Individual responsible for coordination of the follow-up plan: pt  Confirmed correct DME delivered: Jaiyla Granados 04/06/2017    Tzvi Economou

## 2017-04-06 NOTE — Progress Notes (Addendum)
Patient discharged to home, accompanied by his spouse.

## 2017-04-06 NOTE — Plan of Care (Signed)
  RD consulted for nutrition diet education  Lab Results  Component Value Date   HGBA1C 6.3 (H) 02/16/2017    Pt meets criteria for Pre-diabetes. RD provided "Heart Healthy Consistent Carbohydrate Nutrition Therapy" handout from the Academy of Nutrition and Dietetics. Discussed different food groups and their effects on blood sugar, emphasizing carbohydrate-containing foods. Provided list of carbohydrates and recommended serving sizes of common foods.  Discussed importance of controlled and consistent carbohydrate intake throughout the day. Provided examples of ways to balance meals/snacks and encouraged intake of high-fiber, whole grain complex carbohydrates. Discussed diabetic friendly drink options. Discussed low sodium diet and encouraged fresh produce. Teach back method used.  Expect good compliance.  Body mass index is 39.45 kg/m. Pt meets criteria for class II obesity based on current BMI.  Current diet order is regular, patient is consuming approximately 100% of meals at this time. Labs and medications reviewed. No further nutrition interventions warranted at this time. RD contact information provided. If additional nutrition issues arise, please re-consult RD. Plans for discharge home today.   Corrin Parker, MS, RD, LDN Pager # [phone] After hours/ weekend pager # [phone]

## 2017-04-06 NOTE — Patient Care Conference (Signed)
Inpatient RehabilitationTeam Conference and Plan of Care Update Date: 04/04/2017   Time: 2:35 PM    Patient Name: [name]      Medical Record Number: [id number]  Date of Birth: [birth year] Sex: Male         Room/Bed: 4W06C/4W06C-01 Payor Info: Payor: [place] / Plan: BCBS OTHER / Product Type: *No Product type* /    Admitting Diagnosis: CIM  Admit Date/Time:  03/22/2017  3:32 PM Admission Comments: No comment available   Primary Diagnosis:  Debility Principal Problem: Debility  Patient Active Problem List   Diagnosis Date Noted  . Atrial fibrillation ([place])   . Debility   . Wound infection   . OSA (obstructive sleep apnea)   . Acute systolic heart failure ([place])   . Morbid obesity ([place])   . Diabetes mellitus, new onset ([place])   . Dysuria   . Acute respiratory distress syndrome (ARDS) (H[facility])   . Influenza A with pneumonia   . Acute systolic CHF (congestive heart failure) ([place])   . New onset atrial fibrillation ([place])   . Acute pulmonary edema (H[facility])   . Controlled diabetes mellitus type 2 with complications ([place])   . Anxiety   . Current moderate episode of major depressive disorder without prior episode ([place])   . Obesity, Class III, BMI 40-49.9 (morbid obesity) ([place])   . Community acquired pneumonia   . Critical illness myopathy   . Dysphagia   . Persistent atrial fibrillation ([place])   . Acute systolic congestive heart failure ([place])   . Non-Hodgkin's lymphoma ([name])   . Benign essential HTN   . Leukocytosis   . Hypokalemia   . Tracheostomy status ([place])   . Hypoxic   . Pressure injury of skin 02/28/2017  . Acute respiratory failure with hypoxemia ([place])   . Status post tracheostomy ([place])   . Acute respiratory failure ([place])   . Respiratory failure, unspecified with hypoxia ([place]) 02/16/2017  . Follicular lymphoma grade II of intrathoracic lymph nodes ([place]) 05/02/2016  . Portacath in place 05/04/2015  . Gout attack 01/16/2012  . Follicular lymphoma grade II  ([place]) 12/16/2010    Expected Discharge Date: Expected Discharge Date: 04/06/17  Team Members Present: Physician leading conference: Dr. Alger Simons Social Worker Present: Lennart Pall, LCSW Nurse Present: Junius Creamer, RN PT Present: Roderic Ovens, PT OT Present: Willeen Cass, OT SLP Present: Weston Anna, SLP PPS Coordinator present : Daiva Nakayama, RN, CRRN     Current Status/Progress Goal Weekly Team Focus  Medical   Patient continues to progress from a functional standpoint.  Weaning off oxygen.  UTI treated but has leukocytosis now.  Multiple wound issues that were following  Stabilized medically for potential discharge  See above in today's progress note   Bowel/Bladder   Continent of bowel and bladder LBM 04-03-17  To remain continent of bowel and bladder  Assist with toileting as needed    Swallow/Nutrition/ Hydration   Mod I  Mod I  Goals Met   ADL's   supervision overall with steady assistance for LB dressing  mod I overall, S for shower transfer, min A meal prep  ADL retraining, endurance, strengthening, balance, mobility, pt/family edu, d/c planning   Mobility   supervision with RW, min assist for stairs, continues to fatigue quickly  supervision overall, except min assist stair negotiation  stair negotiation, d/c planning, endurance & strengthening, gait   Communication             Safety/Cognition/  Behavioral Observations  Mod I  Mod I  Goals Met    Pain   No indication of pain  To remain < or = 3 on pain scale`  Assess pain every shift or as needed    Skin   Unstageable to bottom   No new skin breakdown   Assess every shift and as needed; proper skin care     Rehab Goals Patient on target to meet rehab goals: Yes *See Care Plan and progress notes for long and short-term goals.     Barriers to Discharge  Current Status/Progress Possible Resolutions Date Resolved   Physician    Medical stability;Wound Care        Continue management of patient's  multiple medical issues      Nursing                  PT                    OT                  SLP                SW                Discharge Planning/Teaching Needs:  Pt will d/c to his home and sister + friends will coordinate 24/7 assistance.  Teaching completed   Team Discussion:  Continues to make good progress;  Using CPAP at night; weaned off O2.  Need to educate caregiver on skin care.  Supervision with all mobility except min assist on stairs.  Ready for d/c Thursday.  Revisions to Treatment Plan:  None    Continued Need for Acute Rehabilitation Level of Care: The patient requires daily medical management by a physician with specialized training in physical medicine and rehabilitation for the following conditions: Daily direction of a multidisciplinary physical rehabilitation program to ensure safe treatment while eliciting the highest outcome that is of practical value to the patient.: Yes Daily medical management of patient stability for increased activity during participation in an intensive rehabilitation regime.: Yes Daily analysis of laboratory values and/or radiology reports with any subsequent need for medication adjustment of medical intervention for : Post surgical problems;Pulmonary problems;Wound care problems  Verlon Carcione 04/06/2017, 10:41 AM

## 2017-04-06 NOTE — Discharge Instructions (Signed)
Inpatient Rehab Discharge Instructions  [facility] Discharge date and time: 04/06/17   Activities/Precautions/ Functional Status: Activity: no lifting, driving, or strenuous exercise for till cleared by MD Diet: cardiac diet and diabetic diet Wound Care: Cleanse areas with soap and water. Pat dry and apply a layer of santyl. cove with foam dressing.    Functional status:  ___ No restrictions     ___ Walk up steps independently ___ 24/7 supervision/assistance   ___ Walk up steps with assistance ___ Intermittent supervision/assistance  ___ Bathe/dress independently ___ Walk with walker     ___ Bathe/dress with assistance ___ Walk Independently    ___ Shower independently ___ Walk with assistance    ___ Shower with assistance ___ No alcohol     ___ Return to work/school ________    COMMUNITY REFERRALS UPON DISCHARGE:    Home Health:   PT     OT    RN                    Agency:  [facility] Phone: [phone]   Medical Equipment/Items Ordered:  Wheelchair, cushion, walker, commode, tub bench and CPAP                                                      Agency/Supplier: [facility] @ [phone]      Special Instructions: 1. Wear support stocking when out of bed. 2. Continue to use flutter valve 4 times a day.     My questions have been answered and I understand these instructions. I will adhere to these goals and the provided educational materials after my discharge from the hospital.  Patient/Caregiver Signature _______________________________ Date __________  Clinician Signature _______________________________________ Date __________  Please bring this form and your medication list with you to all your follow-up doctor's appointments.

## 2017-04-06 NOTE — Progress Notes (Signed)
[facility]     PROGRESS NOTE    Subjective/Complaints: No new issues. States that dysuria is not as bad today. Excited about going home  ROS: Patient denies fever, rash, sore throat, blurred vision, nausea, vomiting, diarrhea, cough, shortness of breath or chest pain, joint or back pain, headache, or mood change.   Objective: Vital Signs: Blood pressure 115/79, pulse 76, temperature 97.7 F (36.5 C), temperature source Oral, resp. rate 18, height 6\' 1"  (1.854 m), weight 135.6 kg (299 lb), SpO2 95 %. No results found. Recent Labs    04/03/17 0939 04/05/17 0744  WBC 15.9* 15.6*  HGB 13.6 13.6  HCT 42.2 41.0  PLT 360 318   Recent Labs    04/03/17 0939  NA 134*  K 4.0  CL 101  GLUCOSE 101*  BUN 16  CREATININE 0.64  CALCIUM 9.4   CBG (last 3)  Recent Labs    04/05/17 1642 04/05/17 2056 04/06/17 0624  GLUCAP 104* 121* 106*    Wt Readings from Last 3 Encounters:  04/06/17 135.6 kg (299 lb)  03/22/17 132.2 kg (291 lb 7.2 oz)  11/07/16 16.5 kg (36 lb 6.4 oz)    Physical Exam:  Constitutional: No distress . Vital signs reviewed. HEENT: EOMI, oral membranes moist Cardiovascular: RRR without murmur. No JVD    Respiratory: CTA Bilaterally without wheezes or rales. Normal effort    GI: BS +, non-tender, non-distended .   LE's with tr to 1+ edema, stable to decr Neurological: He is alert and oriented to person, place, and time. No cranial nerve deficit. Reasonable insight and awareness Motor: UE: 4 to 4+/5 prox to distal. senses pain and light touch.  Musc: pain along right greater troch minimal at best Skin: Skin is warm and dry.  Sacral wounds continue to resolve -Quarter size wound with intermixed fibro- necrotic tissue which is decreasing.  Minimal drainage Psychiatric: Pleasant and appropriate    Assessment/Plan: 1. Functional and mobility deficits secondary to debility which require 3+ hours per day of interdisciplinary  therapy in a comprehensive inpatient rehab setting. Physiatrist is providing close team supervision and 24 hour management of active medical problems listed below. Physiatrist and rehab team continue to assess barriers to discharge/monitor patient progress toward functional and medical goals.  Function:  Bathing Bathing position   Position: Shower  Bathing parts Body parts bathed by patient: Right arm, Left arm, Chest, Abdomen, Front perineal area, Right upper leg, Left upper leg, Buttocks, Right lower leg, Left lower leg Body parts bathed by helper: Left lower leg  Bathing assist Assist Level: Supervision or verbal cues, Set up   Set up : To obtain items  Upper Body Dressing/Undressing Upper body dressing   What is the patient wearing?: Pull over shirt/dress     Pull over shirt/dress - Perfomed by patient: Thread/unthread right sleeve, Thread/unthread left sleeve, Put head through opening, Pull shirt over trunk          Upper body assist Assist Level: Supervision or verbal cues, Set up   Set up : To obtain clothing/put away  Lower Body Dressing/Undressing Lower body dressing   What is the patient wearing?: Shoes     Pants- Performed by patient: Thread/unthread right pants leg, Pull pants up/down, Thread/unthread left pants leg Pants- Performed by helper: Thread/unthread left pants leg Non-skid slipper socks- Performed by patient: Don/doff right sock, Don/doff left sock Non-skid slipper socks- Performed by helper: Don/doff right sock, Don/doff left sock  Shoes - Performed by patient: Don/doff right shoe, Don/doff left shoe            Lower body assist Assist for lower body dressing: Touching or steadying assistance (Pt > 75%)      Toileting Toileting Toileting activity did not occur: No continent bowel/bladder event(used urinal, no BM) Toileting steps completed by patient: Adjust clothing prior to toileting, Adjust clothing after toileting, Performs perineal  hygiene Toileting steps completed by helper: Performs perineal hygiene, Adjust clothing after toileting Toileting Assistive Devices: Grab bar or rail  Toileting assist Assist level: Supervision or verbal cues   Transfers Chair/bed transfer Chair/bed transfer activity did not occur: Safety/medical concerns Chair/bed transfer method: Stand pivot Chair/bed transfer assist level: Set up only Chair/bed transfer assistive device: Armrests     Locomotion Ambulation Ambulation activity did not occur: Safety/medical concerns   Max distance: 55 ft Assist level: Supervision or verbal cues   Wheelchair Wheelchair activity did not occur: Safety/medical concerns Type: Manual Max wheelchair distance: 200 ft Assist Level: No help, No cues, assistive device, takes more than reasonable amount of time  Cognition Comprehension Comprehension assist level: Follows complex conversation/direction with no assist  Expression Expression assist level: Expresses complex ideas: With extra time/assistive device  Social Interaction Social Interaction assist level: Interacts appropriately with others with medication or extra time (anti-anxiety, antidepressant).  Problem Solving Problem solving assist level: Solves complex problems: With extra time  Memory Memory assist level: More than reasonable amount of time   Medical Problem List and Plan: 1.  Generalized weakness, limitation in self-care secondary to debility.   -continue therapies.  PT, OT, SLP   -Finalizing discharge planning for tomorrow  -Patient to see Rehab MD/provider in the office for transitional care encounter in 1-2 weeks.  2.  New onset A fib/DVT Prophylaxis/Anticoagulation: Pharmaceutical: Other (comment)--Eliquis 3. Pain Management: tylenol prn   -mild right greater troch bursitis,      -much improved with ice/stretches 4. Anxiety disorder/Mood: Continue Zoloft with ativan prn. Team to provide ego   5. Neuropsych: This patient is capable of  making decisions on his own behalf. 6. Skin/Wound Care: Santyl with wet to dry dressing to areas of breakdown on sacrum.    - Encourage pressure relief measures.    -working to keep sacral area a little dryer    -Apply wet-to-dry dressing to right heel.  Patient states that wound itself looks better than prior to hospitalization. 7. Fluids/Electrolytes/Nutrition:     -encourage appropriate po intake. 8. OSA?Acute hypoxic RF: Continue albuterol nbes tid. Oxygen with activity and CPAP at nights. Sleep study post discharge.     -using CPAP at night.  Working on getting a sleep study set up as outpatient    -Off oxygen now     -Robitussin as needed for cough 9. Acute systolic CHF: Question viral etiology. Monitor weights daily.    -Continue Coreg bid, Lasix bid,   Lipitor and baby ASA.    -BP's continue to be soft.  Hold Cozaar 25 and norvasc 5mg  daily for now --- potentially resume on an outpatient basis.  Filed Weights   04/04/17 1500 04/05/17 0500 04/06/17 0344  Weight: (!) 139.5 kg (307 lb 9 oz) 135 kg (297 lb 11.2 oz) 135.6 kg (299 lb)     -balanced daily weights 3/25  10. Morbid Obesity: Encourage weight loss to promote health, respiratory status and mobility 11. Hypokalemia: Managed with supplement. wnl 12. New diagnosis T2DM: HGB A1C- 6.3. Monitor BS ac/hs with SSI  for elevated BS.    -reasonable control at present 3/26 13. Follicular lymphoma: In remission.  14. Chronic BLE edema with venous stasis ulcer: removed UNNA boot  LLE    -elevate, observe   -  TEDs.   - Protein/vitamin supplement.  15.  Dysuria with hesitancy:     - recent urine cx with 100k GNR--- Klebsiella pneumoniae and Proteus mirabilis    -bactrim rx completed.    -Leukocytosis of 15,000 yesterday.     -urine culture with 100k klebsiella---spoke with pharmacy----fosfomycin q48 hours x 3 doses        LOS (Days) [place] EVALUATION WAS PERFORMED  Meredith Staggers, MD 04/06/2017 7:56 AM

## 2017-04-07 ENCOUNTER — Telehealth: Payer: Self-pay | Admitting: Registered Nurse

## 2017-04-07 NOTE — Telephone Encounter (Signed)
Transitional Care call Transitional Care Call Completed, Appointment Confirmed, Address Confirmed  Patient name: [name]  DOB: [birth year] 1. Are you/is patient experiencing any problems since coming home? No  a. Are there any questions regarding any aspect of care? No 2. Are there any questions regarding medications administration/dosing? No a. Are meds being taken as prescribed? Yes, awaiting on approval from insurance regarding fosfomycin. He was instructed to call pharmacy, he verbalizes understanding.  b. "Patient should review meds with caller to confirm"  3. Have there been any falls? No 4. Has Home Health been to the house and/or have they contacted you? Yes, [facility] called him today, they are scheduled to come to his home on 04/06/2017.  a. If not, have you tried to contact them? NA b. Can we help you contact them? NA 5. Are bowels and bladder emptying properly? Yes a. Are there any unexpected incontinence issues? No b. If applicable, is patient following bowel/bladder programs? NA 6. Any fevers, problems with breathing, unexpected pain? NA 7. Are there any skin problems or new areas of breakdown? No 8. Has the patient/family member arranged specialty MD follow up (ie cardiology/neurology/renal/surgical/etc.)?  Yes a. Can we help arrange? NA 9. Does the patient need any other services or support that we can help arrange? No 10. Are caregivers following through as expected in assisting the patient? Yes 11. Has the patient quit smoking, drinking alcohol, or using drugs as recommended? [name] states he doesn't smoke, drink alcohol or use illicit drugs.   Appointment date/time 04/12/2017 arrival time 1:20 for 1:40 appointment with Dr. Naaman Plummer , at [address] [phone]

## 2017-04-08 DIAGNOSIS — Z7901 Long term (current) use of anticoagulants: Secondary | ICD-10-CM | POA: Diagnosis not present

## 2017-04-08 DIAGNOSIS — Z4800 Encounter for change or removal of nonsurgical wound dressing: Secondary | ICD-10-CM | POA: Diagnosis not present

## 2017-04-08 DIAGNOSIS — L89151 Pressure ulcer of sacral region, stage 1: Secondary | ICD-10-CM | POA: Diagnosis not present

## 2017-04-08 DIAGNOSIS — E785 Hyperlipidemia, unspecified: Secondary | ICD-10-CM | POA: Diagnosis not present

## 2017-04-08 DIAGNOSIS — I4891 Unspecified atrial fibrillation: Secondary | ICD-10-CM | POA: Diagnosis not present

## 2017-04-08 DIAGNOSIS — I5021 Acute systolic (congestive) heart failure: Secondary | ICD-10-CM | POA: Diagnosis not present

## 2017-04-08 DIAGNOSIS — Z7951 Long term (current) use of inhaled steroids: Secondary | ICD-10-CM | POA: Diagnosis not present

## 2017-04-08 DIAGNOSIS — I110 Hypertensive heart disease with heart failure: Secondary | ICD-10-CM | POA: Diagnosis not present

## 2017-04-08 DIAGNOSIS — E1151 Type 2 diabetes mellitus with diabetic peripheral angiopathy without gangrene: Secondary | ICD-10-CM | POA: Diagnosis not present

## 2017-04-08 DIAGNOSIS — F419 Anxiety disorder, unspecified: Secondary | ICD-10-CM | POA: Diagnosis not present

## 2017-04-08 DIAGNOSIS — C8590 Non-Hodgkin lymphoma, unspecified, unspecified site: Secondary | ICD-10-CM | POA: Diagnosis not present

## 2017-04-10 ENCOUNTER — Telehealth: Payer: Self-pay | Admitting: *Deleted

## 2017-04-10 NOTE — Telephone Encounter (Signed)
Estill Bamberg from [facility] called to verify that Dr Naaman Plummer would be signing [place] orders. I confirmed this with her.

## 2017-04-11 DIAGNOSIS — I110 Hypertensive heart disease with heart failure: Secondary | ICD-10-CM | POA: Diagnosis not present

## 2017-04-11 DIAGNOSIS — F419 Anxiety disorder, unspecified: Secondary | ICD-10-CM | POA: Diagnosis not present

## 2017-04-11 DIAGNOSIS — L89151 Pressure ulcer of sacral region, stage 1: Secondary | ICD-10-CM | POA: Diagnosis not present

## 2017-04-11 DIAGNOSIS — I4891 Unspecified atrial fibrillation: Secondary | ICD-10-CM | POA: Diagnosis not present

## 2017-04-11 DIAGNOSIS — Z7901 Long term (current) use of anticoagulants: Secondary | ICD-10-CM | POA: Diagnosis not present

## 2017-04-11 DIAGNOSIS — C8590 Non-Hodgkin lymphoma, unspecified, unspecified site: Secondary | ICD-10-CM | POA: Diagnosis not present

## 2017-04-11 DIAGNOSIS — E1151 Type 2 diabetes mellitus with diabetic peripheral angiopathy without gangrene: Secondary | ICD-10-CM | POA: Diagnosis not present

## 2017-04-11 DIAGNOSIS — Z7951 Long term (current) use of inhaled steroids: Secondary | ICD-10-CM | POA: Diagnosis not present

## 2017-04-11 DIAGNOSIS — Z4800 Encounter for change or removal of nonsurgical wound dressing: Secondary | ICD-10-CM | POA: Diagnosis not present

## 2017-04-11 DIAGNOSIS — E785 Hyperlipidemia, unspecified: Secondary | ICD-10-CM | POA: Diagnosis not present

## 2017-04-11 DIAGNOSIS — I5021 Acute systolic (congestive) heart failure: Secondary | ICD-10-CM | POA: Diagnosis not present

## 2017-04-12 ENCOUNTER — Encounter
Payer: BLUE CROSS/BLUE SHIELD | Attending: Physical Medicine & Rehabilitation | Admitting: Physical Medicine & Rehabilitation

## 2017-04-12 ENCOUNTER — Telehealth: Payer: Self-pay | Admitting: Physical Medicine & Rehabilitation

## 2017-04-12 ENCOUNTER — Encounter: Payer: Self-pay | Admitting: Physical Medicine & Rehabilitation

## 2017-04-12 VITALS — BP 112/78 | HR 76 | Ht 71.0 in | Wt 299.0 lb

## 2017-04-12 DIAGNOSIS — C8290 Follicular lymphoma, unspecified, unspecified site: Secondary | ICD-10-CM | POA: Diagnosis not present

## 2017-04-12 DIAGNOSIS — F419 Anxiety disorder, unspecified: Secondary | ICD-10-CM | POA: Diagnosis not present

## 2017-04-12 DIAGNOSIS — I10 Essential (primary) hypertension: Secondary | ICD-10-CM

## 2017-04-12 DIAGNOSIS — R300 Dysuria: Secondary | ICD-10-CM | POA: Diagnosis not present

## 2017-04-12 DIAGNOSIS — E119 Type 2 diabetes mellitus without complications: Secondary | ICD-10-CM | POA: Diagnosis not present

## 2017-04-12 DIAGNOSIS — I5021 Acute systolic (congestive) heart failure: Secondary | ICD-10-CM | POA: Insufficient documentation

## 2017-04-12 DIAGNOSIS — G7281 Critical illness myopathy: Secondary | ICD-10-CM

## 2017-04-12 DIAGNOSIS — Z87891 Personal history of nicotine dependence: Secondary | ICD-10-CM | POA: Diagnosis not present

## 2017-04-12 DIAGNOSIS — Z9889 Other specified postprocedural states: Secondary | ICD-10-CM | POA: Insufficient documentation

## 2017-04-12 DIAGNOSIS — R5381 Other malaise: Secondary | ICD-10-CM

## 2017-04-12 DIAGNOSIS — I110 Hypertensive heart disease with heart failure: Secondary | ICD-10-CM | POA: Diagnosis not present

## 2017-04-12 DIAGNOSIS — E785 Hyperlipidemia, unspecified: Secondary | ICD-10-CM | POA: Insufficient documentation

## 2017-04-12 DIAGNOSIS — R531 Weakness: Secondary | ICD-10-CM | POA: Diagnosis not present

## 2017-04-12 DIAGNOSIS — G4733 Obstructive sleep apnea (adult) (pediatric): Secondary | ICD-10-CM

## 2017-04-12 NOTE — Telephone Encounter (Signed)
Spoke with Ann @ [facility], she will have scheduler get in contact with PT and OT and have them call patient to get appointment set up.

## 2017-04-12 NOTE — Patient Instructions (Signed)
PLEASE FEEL FREE TO CALL OUR OFFICE WITH ANY PROBLEMS OR QUESTIONS ([phone])      

## 2017-04-12 NOTE — Telephone Encounter (Signed)
Thank you so much [name]!!!

## 2017-04-12 NOTE — Progress Notes (Signed)
Subjective:    Patient ID: [name], male    DOB: [birth year], 62 y.o.   MRN: [id number]  HPI   [name] is here in follow up of his rehab admission. He has been home for a few days. He has been able to do stairs with a rest break. His urinary symptoms have generally improved. He is still taking lasix which causes him to urinate quite a bit.    [facility] has yet to come out to the house. They haven't heard from them either.   Skin is healing nicely. Bowels are moving regularly.   He is not requiring oxygen and sats are staying in the high 90's in general with occasional dips when he's exercising or using the stair case.   Pain is non-existant.   Pain Inventory Average Pain 0 Pain Right Now 0 My pain is na  In the last 24 hours, has pain interfered with the following? General activity 0 Relation with others 0 Enjoyment of life 0 What TIME of day is your pain at its worst? na Sleep (in general) Good  Pain is worse with: na Pain improves with: na Relief from Meds: na  Mobility walk with assistance use a walker ability to climb steps?  yes do you drive?  no use a wheelchair transfers alone  Function employed # of hrs/week self - 50+  Neuro/Psych trouble walking  Prior Studies Any changes since last visit?  no  Physicians involved in your care Any changes since last visit?  no   Family History  Problem Relation Age of Onset  . Colon cancer Neg Hx   . Pancreatic cancer Neg Hx   . Stomach cancer Neg Hx   . Rectal cancer Neg Hx    Social History   Socioeconomic History  . Marital status: Divorced    Spouse name: Not on file  . Number of children: Not on file  . Years of education: Not on file  . Highest education level: Not on file  Occupational History  . Not on file  Social Needs  . Financial resource strain: Not on file  . Food insecurity:    Worry: Not on file    Inability: Not on file  . Transportation needs:    Medical: Not on file   Non-medical: Not on file  Tobacco Use  . Smoking status: Former Smoker    Packs/day: 0.50    Years: 20.00    Pack years: 10.00    Last attempt to quit: 01/11/2012    Years since quitting: 5.2  . Smokeless tobacco: Never Used  Substance and Sexual Activity  . Alcohol use: No  . Drug use: No  . Sexual activity: Not on file  Lifestyle  . Physical activity:    Days per week: Not on file    Minutes per session: Not on file  . Stress: Not on file  Relationships  . Social connections:    Talks on phone: Not on file    Gets together: Not on file    Attends religious service: Not on file    Active member of club or organization: Not on file    Attends meetings of clubs or organizations: Not on file    Relationship status: Not on file  Other Topics Concern  . Not on file  Social History Narrative  . Not on file   Past Surgical History:  Procedure Laterality Date  . BRONCHOSCOPY    . ESOPHAGOGASTRODUODENOSCOPY ENDOSCOPY  Dr Jearld Fenton  . INGUINAL HERNIA REPAIR     2001  . PORTA CATH INSERTION  2011   Past Medical History:  Diagnosis Date  . Diabetes mellitus, new onset ([place])   . Dyslipidemia   . Gout attack 01/16/2012  . Hypertension   . nhl dx'd 01/2010  . Venous insufficiency   . Venous stasis ulcers ([facility])    BP 112/78   Pulse 76   Ht 5\' 11"  (1.803 m) Comment: states  Wt 299 lb (135.6 kg) Comment: states  SpO2 97%   BMI 41.70 kg/m   Opioid Risk Score:   Fall Risk Score:  `1  Depression screen PHQ 2/9  No flowsheet data found.   Review of Systems  Constitutional: Negative.   HENT: Negative.   Eyes: Negative.   Respiratory: Negative.   Cardiovascular: Negative.   Gastrointestinal: Negative.   Endocrine: Negative.   Genitourinary: Negative.   Musculoskeletal: Positive for gait problem.  Skin: Negative.   Allergic/Immunologic: Negative.   Hematological: Negative.   Psychiatric/Behavioral: Negative.   All other systems reviewed and are negative.        Objective:   Physical Exam  General: No acute distress, obese HEENT: EOMI, oral membranes moist Cards: reg rate  Chest: normal effort Abdomen: Soft, NT, ND Skin: dry, intact Extremities: no edema in eithre leg.  Neurological: He is alert and oriented to person, place, and time. No cranial nerve deficit. Reasonable insight and awareness Motor: UE: 5/5 prox to distal.normal sense. Needs extra time to stand however.  Musc: pain along right greater troch minimal at best Skin: Skin is warm and dry.  Sacral dry, scabbed and nearly resolved -  Psychiatric: Pleasant and appropriate        Assessment & Plan:  1. Generalized weakness, limitation in self-care secondary to debility.             --needs HH therapies to begin. We are contacting [facility].  2. New onset A fib/DVT Prophylaxis/Anticoagulation: Pharmaceutical: Other (comment)--Eliquis   -cards f/u as directed 3. Pain Management: tylenol prn              -stretches ROM as tolerated  4. Anxiety disorder/Mood: Continue Zoloft  .   5. Neuropsych: This patient is capable of making decisions on his own behalf. 6. Skin/Wound Care:--keep skin clean and dry.  7. OSA?Acute hypoxic RF: has Sleep study next Thursday   -CPAP pending study.  9. Acute systolic CHF: Question viral etiology. Monitor weights daily.              -Continue cozaar, Coreg bid, Lasix bid,   Lipitor and baby ASA.  10. Borderline T2DM:  Diet controlled 13. Follicular lymphoma: In remission.  14. Chronic BLE edema resolved  15. Dysuria with hesitancy:               - uti's treated  Thirty minutes of face to face patient care time were spent during this visit. All questions were encouraged and answered. Follow up in a month. No driving.

## 2017-04-13 ENCOUNTER — Telehealth: Payer: Self-pay | Admitting: Medical Oncology

## 2017-04-13 DIAGNOSIS — Z7951 Long term (current) use of inhaled steroids: Secondary | ICD-10-CM | POA: Diagnosis not present

## 2017-04-13 DIAGNOSIS — E785 Hyperlipidemia, unspecified: Secondary | ICD-10-CM | POA: Diagnosis not present

## 2017-04-13 DIAGNOSIS — I5021 Acute systolic (congestive) heart failure: Secondary | ICD-10-CM | POA: Diagnosis not present

## 2017-04-13 DIAGNOSIS — E1151 Type 2 diabetes mellitus with diabetic peripheral angiopathy without gangrene: Secondary | ICD-10-CM | POA: Diagnosis not present

## 2017-04-13 DIAGNOSIS — Z7901 Long term (current) use of anticoagulants: Secondary | ICD-10-CM | POA: Diagnosis not present

## 2017-04-13 DIAGNOSIS — C8590 Non-Hodgkin lymphoma, unspecified, unspecified site: Secondary | ICD-10-CM | POA: Diagnosis not present

## 2017-04-13 DIAGNOSIS — F419 Anxiety disorder, unspecified: Secondary | ICD-10-CM | POA: Diagnosis not present

## 2017-04-13 DIAGNOSIS — L89151 Pressure ulcer of sacral region, stage 1: Secondary | ICD-10-CM | POA: Diagnosis not present

## 2017-04-13 DIAGNOSIS — Z4800 Encounter for change or removal of nonsurgical wound dressing: Secondary | ICD-10-CM | POA: Diagnosis not present

## 2017-04-13 DIAGNOSIS — I110 Hypertensive heart disease with heart failure: Secondary | ICD-10-CM | POA: Diagnosis not present

## 2017-04-13 DIAGNOSIS — I4891 Unspecified atrial fibrillation: Secondary | ICD-10-CM | POA: Diagnosis not present

## 2017-04-13 NOTE — Telephone Encounter (Signed)
Returned pt call. Foster was just d/c from hospital and now getting in home PT and 24 hour care. He said he will call back to r/s appt when he is better. Mohamed concurs.

## 2017-04-14 DIAGNOSIS — Z7951 Long term (current) use of inhaled steroids: Secondary | ICD-10-CM | POA: Diagnosis not present

## 2017-04-14 DIAGNOSIS — L89151 Pressure ulcer of sacral region, stage 1: Secondary | ICD-10-CM | POA: Diagnosis not present

## 2017-04-14 DIAGNOSIS — E785 Hyperlipidemia, unspecified: Secondary | ICD-10-CM | POA: Diagnosis not present

## 2017-04-14 DIAGNOSIS — C8590 Non-Hodgkin lymphoma, unspecified, unspecified site: Secondary | ICD-10-CM | POA: Diagnosis not present

## 2017-04-14 DIAGNOSIS — F419 Anxiety disorder, unspecified: Secondary | ICD-10-CM | POA: Diagnosis not present

## 2017-04-14 DIAGNOSIS — I4891 Unspecified atrial fibrillation: Secondary | ICD-10-CM | POA: Diagnosis not present

## 2017-04-14 DIAGNOSIS — I5021 Acute systolic (congestive) heart failure: Secondary | ICD-10-CM | POA: Diagnosis not present

## 2017-04-14 DIAGNOSIS — Z4800 Encounter for change or removal of nonsurgical wound dressing: Secondary | ICD-10-CM | POA: Diagnosis not present

## 2017-04-14 DIAGNOSIS — E1151 Type 2 diabetes mellitus with diabetic peripheral angiopathy without gangrene: Secondary | ICD-10-CM | POA: Diagnosis not present

## 2017-04-14 DIAGNOSIS — I110 Hypertensive heart disease with heart failure: Secondary | ICD-10-CM | POA: Diagnosis not present

## 2017-04-14 DIAGNOSIS — Z7901 Long term (current) use of anticoagulants: Secondary | ICD-10-CM | POA: Diagnosis not present

## 2017-04-17 ENCOUNTER — Inpatient Hospital Stay: Payer: BLUE CROSS/BLUE SHIELD

## 2017-04-17 ENCOUNTER — Other Ambulatory Visit: Payer: BLUE CROSS/BLUE SHIELD

## 2017-04-18 ENCOUNTER — Telehealth: Payer: Self-pay

## 2017-04-18 ENCOUNTER — Encounter: Payer: Self-pay | Admitting: Cardiology

## 2017-04-18 DIAGNOSIS — Z7951 Long term (current) use of inhaled steroids: Secondary | ICD-10-CM | POA: Diagnosis not present

## 2017-04-18 DIAGNOSIS — E1151 Type 2 diabetes mellitus with diabetic peripheral angiopathy without gangrene: Secondary | ICD-10-CM | POA: Diagnosis not present

## 2017-04-18 DIAGNOSIS — Z4800 Encounter for change or removal of nonsurgical wound dressing: Secondary | ICD-10-CM | POA: Diagnosis not present

## 2017-04-18 DIAGNOSIS — I5021 Acute systolic (congestive) heart failure: Secondary | ICD-10-CM | POA: Diagnosis not present

## 2017-04-18 DIAGNOSIS — Z7901 Long term (current) use of anticoagulants: Secondary | ICD-10-CM | POA: Diagnosis not present

## 2017-04-18 DIAGNOSIS — I4891 Unspecified atrial fibrillation: Secondary | ICD-10-CM | POA: Diagnosis not present

## 2017-04-18 DIAGNOSIS — L89151 Pressure ulcer of sacral region, stage 1: Secondary | ICD-10-CM | POA: Diagnosis not present

## 2017-04-18 DIAGNOSIS — I110 Hypertensive heart disease with heart failure: Secondary | ICD-10-CM | POA: Diagnosis not present

## 2017-04-18 DIAGNOSIS — F419 Anxiety disorder, unspecified: Secondary | ICD-10-CM | POA: Diagnosis not present

## 2017-04-18 DIAGNOSIS — E785 Hyperlipidemia, unspecified: Secondary | ICD-10-CM | POA: Diagnosis not present

## 2017-04-18 DIAGNOSIS — C8590 Non-Hodgkin lymphoma, unspecified, unspecified site: Secondary | ICD-10-CM | POA: Diagnosis not present

## 2017-04-18 NOTE — Telephone Encounter (Signed)
Cat-OT from [facility] called requesting orders of 2wk3

## 2017-04-18 NOTE — Progress Notes (Signed)
Cardiology Office Note   Date:  04/20/2017   ID:  [name], DOB [birth year], MRN [id number]  PCP:  [name], MD  Cardiologist:   No primary care provider on file. Referring:  [name], MD  Chief Complaint  Patient presents with  . Atrial Fibrillation      History of Present Illness: [name] is a 62 y.o. male who presents for hospital follow up after admission last month with respiratory failure related to ARDS/influenza A.   He was found to have an EF of 25% which was new and new onset atrial fib.  He had a protracted hospital stay and went to rehab afterward.     Since the home he has been working with physical therapy and occupational therapy.  He has been doing well from this standpoint.  His blood pressures are low.  He does not notice this.  He is not having any presyncope or syncope.  He is not having any chest pressure, neck or arm discomfort.  He denies any shortness of breath, PND or orthopnea.  He has no weight gain or edema.  He is weak and walking with a walker but he is progressively getting better.  He does climb half a flight of stairs and has to pause is getting better with this.   Past Medical History:  Diagnosis Date  . Acute systolic HF (heart failure) ([place])   . Atrial fibrillation ([place])   . Diabetes mellitus, new onset ([place])   . Dyslipidemia   . Gout attack 01/16/2012  . Hypertension   . nhl dx'd 01/2010  . Venous insufficiency   . Venous stasis ulcers ([facility])     Past Surgical History:  Procedure Laterality Date  . BRONCHOSCOPY    . ESOPHAGOGASTRODUODENOSCOPY ENDOSCOPY     Dr Jearld Fenton  . INGUINAL HERNIA REPAIR     2001  . PORTA CATH INSERTION  2011     Current Outpatient Medications  Medication Sig Dispense Refill  . albuterol (PROVENTIL HFA;VENTOLIN HFA) 108 (90 Base) MCG/ACT inhaler Inhale 2 puffs into the lungs every 6 (six) hours as needed for wheezing or shortness of breath. 1 Inhaler 2  . apixaban (ELIQUIS) 5 MG TABS tablet  Take 1 tablet (5 mg total) by mouth 2 (two) times daily. 60 tablet 0  . atorvastatin (LIPITOR) 20 MG tablet Take 1 tablet (20 mg total) by mouth daily. 30 tablet 0  . carvedilol (COREG) 25 MG tablet Take 1 tablet (25 mg total) by mouth 2 (two) times daily with a meal. 60 tablet 0  . collagenase (SANTYL) ointment Apply topically daily. 15 g 0  . febuxostat (ULORIC) 40 MG tablet Take 1 tablet (40 mg total) by mouth daily. 30 tablet 0  . furosemide (LASIX) 40 MG tablet Take 1.5 tablets (60 mg total) by mouth 2 (two) times daily. 90 tablet 0  . losartan (COZAAR) 25 MG tablet Take 1 tablet (25 mg total) by mouth daily. 30 tablet 0  . Melatonin 3 MG TABS Take 2 tablets (6 mg total) by mouth at bedtime. 60 tablet 0  . Multiple Vitamin (MULTIVITAMIN WITH MINERALS) TABS tablet Take 1 tablet by mouth daily.    . pantoprazole (PROTONIX) 40 MG tablet Take 1 tablet (40 mg total) by mouth daily. 30 tablet 0  . potassium chloride SA (K-DUR,KLOR-CON) 20 MEQ tablet Take 2 tablets (40 mEq total) by mouth 2 (two) times daily. 120 tablet 0  . Probiotic Product (PROBIOTIC DAILY PO)  Take by mouth.    . sertraline (ZOLOFT) 50 MG tablet Take 1 tablet (50 mg total) by mouth daily. 30 tablet 0  . sodium chloride (OCEAN) 0.65 % SOLN nasal spray Place 2 sprays into both nostrils as needed for congestion.  0  . TURMERIC PO Take by mouth.    . vitamin C (VITAMIN C) 500 MG tablet Take 1 tablet (500 mg total) by mouth 3 (three) times daily.     No current facility-administered medications for this visit.    Facility-Administered Medications Ordered in Other Visits  Medication Dose Route Frequency Provider Last Rate Last Dose  . sodium chloride 0.9 % injection 10 mL  10 mL Intravenous PRN Curt Bears, MD   10 mL at 05/04/15 1529    Allergies:   Latex and Penicillins    Social History:  The patient  reports that he quit smoking about 5 years ago. He has a 10.00 pack-year smoking history. He has never used smokeless  tobacco. He reports that he does not drink alcohol or use drugs.   Family History:  The patient's family history is not on file.    ROS:  Please see the history of present illness.   Otherwise, review of systems are positive for none.   All other systems are reviewed and negative.    PHYSICAL EXAM: VS:  BP 96/62   Pulse 95   Ht 5\' 11"  (1.803 m)   Wt (!) 304 lb 9.6 oz (138.2 kg)   BMI 42.48 kg/m  , BMI Body mass index is 42.48 kg/m. GENERAL:  Well appearing HEENT:  Pupils equal round and reactive, fundi not visualized, oral mucosa unremarkable NECK:  No jugular venous distention, waveform within normal limits, carotid upstroke brisk and symmetric, no bruits, no thyromegaly LYMPHATICS:  No cervical, inguinal adenopathy LUNGS:  Clear to auscultation bilaterally BACK:  No CVA tenderness CHEST:  Unremarkable HEART:  PMI not displaced or sustained,S1 and S2 within normal limits, no S3, no clicks, no rubs, no murmurs, irregular ABD:  Flat, positive bowel sounds normal in frequency in pitch, no bruits, no rebound, no guarding, no midline pulsatile mass, no hepatomegaly, no splenomegaly EXT:  2 plus pulses throughout, no edema, no cyanosis no clubbing SKIN:  No rashes no nodules NEURO:  Cranial nerves II through XII grossly intact, motor grossly intact throughout PSYCH:  Cognitively intact, oriented to person place and time    EKG:  EKG is ordered today.  The ekg ordered today demonstrates atrial fibrillation, rate 79, right bundle branch block, left anterior fascicular block, no acute ST-T wave changes.    Recent Labs: 02/22/2017: B Natriuretic Peptide 401.1 03/22/2017: Magnesium 1.6 03/23/2017: ALT 38 04/03/2017: BUN 16; Creatinine, Ser 0.64; Potassium 4.0; Sodium 134 04/05/2017: Hemoglobin 13.6; Platelets 318    Lipid Panel    Component Value Date/Time   TRIG 122 02/26/2017 1306      Wt Readings from Last 3 Encounters:  04/20/17 (!) 304 lb 9.6 oz (138.2 kg)  04/12/17 299 lb  (135.6 kg)  04/06/17 299 lb (135.6 kg)      Other studies Reviewed: Additional studies/ records that were reviewed today include: Hospital records. . Review of the above records demonstrates:  Please see elsewhere in the note.     ASSESSMENT AND PLAN:  ACUTE SYSTOLIC HF: The patient has a reduced ejection fraction but seems to be euvolemic.  At this point because he has low blood pressure I cannot titrate his medications but I do not  have to reduce the dose.  For now he will continue on the meds as listed.  Once we have  treated his sleep apnea and perhaps been able to titrate his meds further I will repeat an echocardiogram.  Of note if his ejection fraction remains low on follow-up we will do ischemia evaluation.  ATRIAL FIB: He is tolerating anticoagulation and has reasonable rate control.  I would ultimately plan cardioversion.  However, he is going to get a sleep study tonight and hopefully prescribe treatment for his sleep apnea.  He was given a CPAP machine generically which he took home from the hospital but he is not sure that this is working adequately.  When he comes back in 1 month we can discuss timing of his cardioversion and potentially arrange this.  For now he will remain on the meds as listed.   Current medicines are reviewed at length with the patient today.  The patient does not have concerns regarding medicines.  The following changes have been made:  no change  Labs/ tests ordered today include: None  Orders Placed This Encounter  Procedures  . EKG 12-Lead     Disposition:   FU with with APP in one month      Signed, Minus Breeding, MD  04/20/2017 10:23 AM    [place]

## 2017-04-18 NOTE — Telephone Encounter (Signed)
Called her back and verified and approved verbal orders

## 2017-04-20 ENCOUNTER — Encounter: Payer: Self-pay | Admitting: Cardiology

## 2017-04-20 ENCOUNTER — Ambulatory Visit (HOSPITAL_BASED_OUTPATIENT_CLINIC_OR_DEPARTMENT_OTHER): Payer: BLUE CROSS/BLUE SHIELD | Attending: Physical Medicine and Rehabilitation | Admitting: Pulmonary Disease

## 2017-04-20 ENCOUNTER — Ambulatory Visit: Payer: BLUE CROSS/BLUE SHIELD | Admitting: Cardiology

## 2017-04-20 VITALS — Ht 71.0 in | Wt 297.0 lb

## 2017-04-20 VITALS — BP 96/62 | HR 95 | Ht 71.0 in | Wt 304.6 lb

## 2017-04-20 DIAGNOSIS — J9601 Acute respiratory failure with hypoxia: Secondary | ICD-10-CM | POA: Insufficient documentation

## 2017-04-20 DIAGNOSIS — Z7901 Long term (current) use of anticoagulants: Secondary | ICD-10-CM | POA: Diagnosis not present

## 2017-04-20 DIAGNOSIS — G4733 Obstructive sleep apnea (adult) (pediatric): Secondary | ICD-10-CM | POA: Diagnosis not present

## 2017-04-20 DIAGNOSIS — I110 Hypertensive heart disease with heart failure: Secondary | ICD-10-CM | POA: Diagnosis not present

## 2017-04-20 DIAGNOSIS — I481 Persistent atrial fibrillation: Secondary | ICD-10-CM

## 2017-04-20 DIAGNOSIS — Z4800 Encounter for change or removal of nonsurgical wound dressing: Secondary | ICD-10-CM | POA: Diagnosis not present

## 2017-04-20 DIAGNOSIS — C8590 Non-Hodgkin lymphoma, unspecified, unspecified site: Secondary | ICD-10-CM | POA: Diagnosis not present

## 2017-04-20 DIAGNOSIS — I5021 Acute systolic (congestive) heart failure: Secondary | ICD-10-CM | POA: Diagnosis not present

## 2017-04-20 DIAGNOSIS — E785 Hyperlipidemia, unspecified: Secondary | ICD-10-CM | POA: Diagnosis not present

## 2017-04-20 DIAGNOSIS — E669 Obesity, unspecified: Secondary | ICD-10-CM | POA: Diagnosis not present

## 2017-04-20 DIAGNOSIS — E1151 Type 2 diabetes mellitus with diabetic peripheral angiopathy without gangrene: Secondary | ICD-10-CM | POA: Diagnosis not present

## 2017-04-20 DIAGNOSIS — L89151 Pressure ulcer of sacral region, stage 1: Secondary | ICD-10-CM | POA: Diagnosis not present

## 2017-04-20 DIAGNOSIS — F419 Anxiety disorder, unspecified: Secondary | ICD-10-CM | POA: Diagnosis not present

## 2017-04-20 DIAGNOSIS — I4891 Unspecified atrial fibrillation: Secondary | ICD-10-CM | POA: Diagnosis not present

## 2017-04-20 DIAGNOSIS — Z6841 Body Mass Index (BMI) 40.0 and over, adult: Secondary | ICD-10-CM | POA: Insufficient documentation

## 2017-04-20 DIAGNOSIS — Z7951 Long term (current) use of inhaled steroids: Secondary | ICD-10-CM | POA: Diagnosis not present

## 2017-04-20 DIAGNOSIS — I4819 Other persistent atrial fibrillation: Secondary | ICD-10-CM

## 2017-04-20 NOTE — Patient Instructions (Signed)
Medication Instructions:  Continue current medications  If you need a refill on your cardiac medications before your next appointment, please call your pharmacy.  Labwork: None Ordered   Testing/Procedures: None Ordered  Follow-Up: Your physician wants you to follow-up in: 1 Month with APP.     Thank you for choosing [facility] at [facility]!!

## 2017-04-21 DIAGNOSIS — I4891 Unspecified atrial fibrillation: Secondary | ICD-10-CM | POA: Diagnosis not present

## 2017-04-21 DIAGNOSIS — I5021 Acute systolic (congestive) heart failure: Secondary | ICD-10-CM | POA: Diagnosis not present

## 2017-04-21 DIAGNOSIS — C8590 Non-Hodgkin lymphoma, unspecified, unspecified site: Secondary | ICD-10-CM | POA: Diagnosis not present

## 2017-04-21 DIAGNOSIS — E1151 Type 2 diabetes mellitus with diabetic peripheral angiopathy without gangrene: Secondary | ICD-10-CM | POA: Diagnosis not present

## 2017-04-21 DIAGNOSIS — Z7951 Long term (current) use of inhaled steroids: Secondary | ICD-10-CM | POA: Diagnosis not present

## 2017-04-21 DIAGNOSIS — Z7901 Long term (current) use of anticoagulants: Secondary | ICD-10-CM | POA: Diagnosis not present

## 2017-04-21 DIAGNOSIS — E785 Hyperlipidemia, unspecified: Secondary | ICD-10-CM | POA: Diagnosis not present

## 2017-04-21 DIAGNOSIS — I110 Hypertensive heart disease with heart failure: Secondary | ICD-10-CM | POA: Diagnosis not present

## 2017-04-21 DIAGNOSIS — L89151 Pressure ulcer of sacral region, stage 1: Secondary | ICD-10-CM | POA: Diagnosis not present

## 2017-04-21 DIAGNOSIS — F419 Anxiety disorder, unspecified: Secondary | ICD-10-CM | POA: Diagnosis not present

## 2017-04-21 DIAGNOSIS — Z4800 Encounter for change or removal of nonsurgical wound dressing: Secondary | ICD-10-CM | POA: Diagnosis not present

## 2017-04-25 ENCOUNTER — Encounter: Payer: Self-pay | Admitting: Adult Health

## 2017-04-25 ENCOUNTER — Ambulatory Visit: Payer: BLUE CROSS/BLUE SHIELD | Admitting: Adult Health

## 2017-04-25 ENCOUNTER — Ambulatory Visit (INDEPENDENT_AMBULATORY_CARE_PROVIDER_SITE_OTHER)
Admission: RE | Admit: 2017-04-25 | Discharge: 2017-04-25 | Disposition: A | Discharge status: Home / Self Care | Payer: BLUE CROSS/BLUE SHIELD | Source: Ambulatory Visit | Attending: Adult Health | Admitting: Adult Health

## 2017-04-25 VITALS — BP 100/82 | HR 86 | Ht 72.0 in | Wt 303.0 lb

## 2017-04-25 DIAGNOSIS — J80 Acute respiratory distress syndrome: Secondary | ICD-10-CM

## 2017-04-25 DIAGNOSIS — C8590 Non-Hodgkin lymphoma, unspecified, unspecified site: Secondary | ICD-10-CM | POA: Diagnosis not present

## 2017-04-25 DIAGNOSIS — Z4800 Encounter for change or removal of nonsurgical wound dressing: Secondary | ICD-10-CM | POA: Diagnosis not present

## 2017-04-25 DIAGNOSIS — J189 Pneumonia, unspecified organism: Secondary | ICD-10-CM

## 2017-04-25 DIAGNOSIS — J09X1 Influenza due to identified novel influenza A virus with pneumonia: Secondary | ICD-10-CM

## 2017-04-25 DIAGNOSIS — Z930 Tracheostomy status: Secondary | ICD-10-CM

## 2017-04-25 DIAGNOSIS — J181 Lobar pneumonia, unspecified organism: Secondary | ICD-10-CM | POA: Diagnosis not present

## 2017-04-25 DIAGNOSIS — J9691 Respiratory failure, unspecified with hypoxia: Secondary | ICD-10-CM | POA: Diagnosis not present

## 2017-04-25 DIAGNOSIS — E785 Hyperlipidemia, unspecified: Secondary | ICD-10-CM | POA: Diagnosis not present

## 2017-04-25 DIAGNOSIS — Z7951 Long term (current) use of inhaled steroids: Secondary | ICD-10-CM | POA: Diagnosis not present

## 2017-04-25 DIAGNOSIS — L89151 Pressure ulcer of sacral region, stage 1: Secondary | ICD-10-CM | POA: Diagnosis not present

## 2017-04-25 DIAGNOSIS — G4733 Obstructive sleep apnea (adult) (pediatric): Secondary | ICD-10-CM

## 2017-04-25 DIAGNOSIS — Z7901 Long term (current) use of anticoagulants: Secondary | ICD-10-CM | POA: Diagnosis not present

## 2017-04-25 DIAGNOSIS — I110 Hypertensive heart disease with heart failure: Secondary | ICD-10-CM | POA: Diagnosis not present

## 2017-04-25 DIAGNOSIS — I4891 Unspecified atrial fibrillation: Secondary | ICD-10-CM | POA: Diagnosis not present

## 2017-04-25 DIAGNOSIS — F419 Anxiety disorder, unspecified: Secondary | ICD-10-CM | POA: Diagnosis not present

## 2017-04-25 DIAGNOSIS — G7281 Critical illness myopathy: Secondary | ICD-10-CM | POA: Diagnosis not present

## 2017-04-25 DIAGNOSIS — E1151 Type 2 diabetes mellitus with diabetic peripheral angiopathy without gangrene: Secondary | ICD-10-CM | POA: Diagnosis not present

## 2017-04-25 DIAGNOSIS — I5021 Acute systolic (congestive) heart failure: Secondary | ICD-10-CM | POA: Diagnosis not present

## 2017-04-25 NOTE — Patient Instructions (Addendum)
Advance activity as tolerated.  Continue PT at home .  Chest xray today .  Follow up in 6-8 weeks with Dr. Lamonte Sakai  With  PFT and As needed

## 2017-04-25 NOTE — Progress Notes (Signed)
@Patient  ID: [name], male    DOB: [birth year], 62 y.o.   MRN: [id number]  Chief Complaint  Patient presents with  . Follow-up    Referring provider: [name], MD  HPI: 62 year old male former smoker with known lymphoma in remission seen for PCCM consult March 2019 after ARDS due to influenza and newly diagnosed severe systolic congestive heart failure with a EF at 25%.   TEST [facility]  Trach 2/9 >3/13 2D echo 04/17/2017 showed EF 25 to 30%, diffuse hypokinesis.    04/25/2017 Follow up : ARDS /CHF  Patient returns for a one-month follow-up.  Patient was recently hospitalized for critical illness with ARDS due to influenza and newly found severe systolic congestive heart failure with a EF of 25%. Patient had severe respiratory distress with influenza.  Found to have ARDS.  And pneumonia.  He required prolonged vent support with subsequent tracheostomy.  He was able to be decannulated March 13.  He also had newly diagnosed systolic congestive heart failure with a EF of 25%.  He was treated with aggressive pulmonary hygiene , IV antibiotics.  He was discharged to rehab center. Has been home for 2 weeks . Undergoing PT at home. Not on oxygen . Prior to admission no known lung issues.  Patient was suspected to have some underlying sleep apnea.  He was set up for a sleep study done on April 20, 2016 results are pending..  Since home , has been able to increase activity . Each day seems to be getting stronger . Still has trouble with doing activities for long periord of time without giving out. During hosptialization lost >40lbs.  He has followed up with cardiology last week, conitnued on same regimen for CHF .  Denies increased edema or orthopnea.    Allergies  Allergen Reactions  . Latex Hives and Swelling  . Penicillins Swelling    Age 35 or 38 - pt states he had swelling all over and his lips got real big    Immunization History  Administered Date(s) Administered  .  Pneumococcal Polysaccharide-23 03/23/2017    Past Medical History:  Diagnosis Date  . Acute systolic HF (heart failure) ([place])   . Atrial fibrillation ([place])   . Diabetes mellitus, new onset ([place])   . Dyslipidemia   . Gout attack 01/16/2012  . Hypertension   . nhl dx'd 01/2010  . Venous insufficiency   . Venous stasis ulcers ([facility])     Tobacco History: Social History   Tobacco Use  Smoking Status Former Smoker  . Packs/day: 0.50  . Years: 20.00  . Pack years: 10.00  . Last attempt to quit: 01/10/2009  . Years since quitting: 8.2  Smokeless Tobacco Never Used   Counseling given: Not Answered   Outpatient Encounter Medications as of 04/25/2017  Medication Sig  . albuterol (PROVENTIL HFA;VENTOLIN HFA) 108 (90 Base) MCG/ACT inhaler Inhale 2 puffs into the lungs every 6 (six) hours as needed for wheezing or shortness of breath.  [name] Kitchen apixaban (ELIQUIS) 5 MG TABS tablet Take 1 tablet (5 mg total) by mouth 2 (two) times daily.  [name] Kitchen atorvastatin (LIPITOR) 20 MG tablet Take 1 tablet (20 mg total) by mouth daily.  . carvedilol (COREG) 25 MG tablet Take 1 tablet (25 mg total) by mouth 2 (two) times daily with a meal.  . collagenase (SANTYL) ointment Apply topically daily.  . febuxostat (ULORIC) 40 MG tablet Take 1 tablet (40 mg total) by mouth daily.  . furosemide (LASIX) 40  MG tablet Take 1.5 tablets (60 mg total) by mouth 2 (two) times daily.  [name] Kitchen losartan (COZAAR) 25 MG tablet Take 1 tablet (25 mg total) by mouth daily.  . Melatonin 3 MG TABS Take 2 tablets (6 mg total) by mouth at bedtime.  . Multiple Vitamin (MULTIVITAMIN WITH MINERALS) TABS tablet Take 1 tablet by mouth daily.  . pantoprazole (PROTONIX) 40 MG tablet Take 1 tablet (40 mg total) by mouth daily.  . potassium chloride SA (K-DUR,KLOR-CON) 20 MEQ tablet Take 2 tablets (40 mEq total) by mouth 2 (two) times daily.  . Probiotic Product (PROBIOTIC DAILY PO) Take by mouth.  . sertraline (ZOLOFT) 50 MG tablet Take 1 tablet (50 mg total)  by mouth daily.  . sodium chloride (OCEAN) 0.65 % SOLN nasal spray Place 2 sprays into both nostrils as needed for congestion.  . TURMERIC PO Take by mouth.  . vitamin C (VITAMIN C) 500 MG tablet Take 1 tablet (500 mg total) by mouth 3 (three) times daily.   Facility-Administered Encounter Medications as of 04/25/2017  Medication  . sodium chloride 0.9 % injection 10 mL     Review of Systems  Constitutional:   No  weight loss, night sweats,  Fevers, chills, +fatigue, or  lassitude.  HEENT:   No headaches,  Difficulty swallowing,  Tooth/dental problems, or  Sore throat,                No sneezing, itching, ear ache, nasal congestion, post nasal drip,   CV:  No chest pain,  Orthopnea, PND,  , anasarca, dizziness, palpitations, syncope.   GI  No heartburn, indigestion, abdominal pain, nausea, vomiting, diarrhea, change in bowel habits, loss of appetite, bloody stools.   Resp: .  No excess mucus, no productive cough,  No non-productive cough,  No coughing up of blood.  No change in color of mucus.  No wheezing.  No chest wall deformity  Skin: no rash or lesions.  GU: no dysuria, change in color of urine, no urgency or frequency.  No flank pain, no hematuria   MS:  No joint pain or swelling.  No decreased range of motion.  No back pain. gen weakness    Physical Exam  BP 100/82 (BP Location: Right Arm, Cuff Size: Large)   Pulse 86   Ht 6' (1.829 m)   Wt (!) 303 lb (137.4 kg)   SpO2 98%   BMI 41.09 kg/m   GEN: A/Ox3; pleasant , NAD, obese , in wc    HEENT:  [place]/AT,  EACs-clear, TMs-wnl, NOSE-clear, THROAT-clear, no lesions, no postnasal drip or exudate noted.   NECK:  Supple w/ fair ROM; no JVD; normal carotid impulses w/o bruits; no thyromegaly or nodules palpated; no lymphadenopathy.  Well healed trach site/stoma .   RESP  Decreased BS in bases no accessory muscle use, no dullness to percussion  CARD:  RRR, no m/r/g, tr  peripheral edema, pulses intact, no cyanosis or  clubbing.  GI:   Soft & nt; nml bowel sounds; no organomegaly or masses detected.   Musco: Warm bil, no deformities or joint swelling noted.   Neuro: alert, no focal deficits noted.    Skin: Warm, no lesions or rashes    Lab Results:  CBC    Component Value Date/Time   WBC 15.6 (H) 04/05/2017 0744   RBC 4.53 04/05/2017 0744   HGB 13.6 04/05/2017 0744   HGB 14.5 10/24/2016 1440   HCT 41.0 04/05/2017 0744   HCT 43.8  10/24/2016 1440   PLT 318 04/05/2017 0744   PLT 177 10/24/2016 1440   MCV 90.5 04/05/2017 0744   MCV 92.4 10/24/2016 1440   MCH 30.0 04/05/2017 0744   MCHC 33.2 04/05/2017 0744   RDW 16.4 (H) 04/05/2017 0744   RDW 15.2 (H) 10/24/2016 1440   LYMPHSABS 1.6 03/23/2017 0429   LYMPHSABS 1.7 10/24/2016 1440   MONOABS 1.9 (H) 03/23/2017 0429   MONOABS 0.9 10/24/2016 1440   EOSABS 0.3 03/23/2017 0429   EOSABS 0.3 10/24/2016 1440   BASOSABS 0.1 03/23/2017 0429   BASOSABS 0.1 10/24/2016 1440    BMET    Component Value Date/Time   NA 134 (L) 04/03/2017 0939   NA 137 10/24/2016 1440   K 4.0 04/03/2017 0939   K 3.6 10/24/2016 1440   CL 101 04/03/2017 0939   CL 105 05/14/2012 0840   CO2 21 (L) 04/03/2017 0939   CO2 26 10/24/2016 1440   GLUCOSE 101 (H) 04/03/2017 0939   GLUCOSE 94 10/24/2016 1440   GLUCOSE 102 (H) 05/14/2012 0840   BUN 16 04/03/2017 0939   BUN 16.2 10/24/2016 1440   CREATININE 0.64 04/03/2017 0939   CREATININE 0.8 10/24/2016 1440   CALCIUM 9.4 04/03/2017 0939   CALCIUM 9.2 10/24/2016 1440   GFRNONAA >60 04/03/2017 0939   GFRAA >60 04/03/2017 0939    BNP    Component Value Date/Time   BNP 401.1 (H) 02/22/2017 1100    ProBNP No results found for: PROBNP  Imaging: No results found.   Assessment & Plan:   No problem-specific Assessment & Plan notes found for this encounter.     Rexene Edison, NP 04/25/2017

## 2017-04-26 DIAGNOSIS — Z7951 Long term (current) use of inhaled steroids: Secondary | ICD-10-CM | POA: Diagnosis not present

## 2017-04-26 DIAGNOSIS — I4891 Unspecified atrial fibrillation: Secondary | ICD-10-CM | POA: Diagnosis not present

## 2017-04-26 DIAGNOSIS — E1151 Type 2 diabetes mellitus with diabetic peripheral angiopathy without gangrene: Secondary | ICD-10-CM | POA: Diagnosis not present

## 2017-04-26 DIAGNOSIS — L89151 Pressure ulcer of sacral region, stage 1: Secondary | ICD-10-CM | POA: Diagnosis not present

## 2017-04-26 DIAGNOSIS — I5021 Acute systolic (congestive) heart failure: Secondary | ICD-10-CM | POA: Diagnosis not present

## 2017-04-26 DIAGNOSIS — I110 Hypertensive heart disease with heart failure: Secondary | ICD-10-CM | POA: Diagnosis not present

## 2017-04-26 DIAGNOSIS — Z7901 Long term (current) use of anticoagulants: Secondary | ICD-10-CM | POA: Diagnosis not present

## 2017-04-26 DIAGNOSIS — E785 Hyperlipidemia, unspecified: Secondary | ICD-10-CM | POA: Diagnosis not present

## 2017-04-26 DIAGNOSIS — C8590 Non-Hodgkin lymphoma, unspecified, unspecified site: Secondary | ICD-10-CM | POA: Diagnosis not present

## 2017-04-26 DIAGNOSIS — Z4800 Encounter for change or removal of nonsurgical wound dressing: Secondary | ICD-10-CM | POA: Diagnosis not present

## 2017-04-26 DIAGNOSIS — F419 Anxiety disorder, unspecified: Secondary | ICD-10-CM | POA: Diagnosis not present

## 2017-04-26 NOTE — Assessment & Plan Note (Signed)
Sleep study pending 

## 2017-04-26 NOTE — Assessment & Plan Note (Signed)
Resolved

## 2017-04-26 NOTE — Assessment & Plan Note (Signed)
Cont PT

## 2017-04-26 NOTE — Assessment & Plan Note (Signed)
Recent critical illness with Influenza /PNA - prolonged hospitalization with VDRD [name] . Improving after rehab stay , decannulation and pulmonary hygiene regimen  He is making great progress. Remains oxygen free. Activity tolerance is improving .  Should continue with PT and increase mobility as able .  Check CXR today .

## 2017-04-26 NOTE — Assessment & Plan Note (Signed)
Previous trach, stoma site is well healed and approximated.

## 2017-04-27 ENCOUNTER — Telehealth: Payer: Self-pay | Admitting: Adult Health

## 2017-04-27 DIAGNOSIS — I5021 Acute systolic (congestive) heart failure: Secondary | ICD-10-CM | POA: Diagnosis not present

## 2017-04-27 DIAGNOSIS — F419 Anxiety disorder, unspecified: Secondary | ICD-10-CM | POA: Diagnosis not present

## 2017-04-27 DIAGNOSIS — Z7901 Long term (current) use of anticoagulants: Secondary | ICD-10-CM | POA: Diagnosis not present

## 2017-04-27 DIAGNOSIS — C8590 Non-Hodgkin lymphoma, unspecified, unspecified site: Secondary | ICD-10-CM | POA: Diagnosis not present

## 2017-04-27 DIAGNOSIS — I110 Hypertensive heart disease with heart failure: Secondary | ICD-10-CM | POA: Diagnosis not present

## 2017-04-27 DIAGNOSIS — L89151 Pressure ulcer of sacral region, stage 1: Secondary | ICD-10-CM | POA: Diagnosis not present

## 2017-04-27 DIAGNOSIS — Z4800 Encounter for change or removal of nonsurgical wound dressing: Secondary | ICD-10-CM | POA: Diagnosis not present

## 2017-04-27 DIAGNOSIS — Z7951 Long term (current) use of inhaled steroids: Secondary | ICD-10-CM | POA: Diagnosis not present

## 2017-04-27 DIAGNOSIS — I4891 Unspecified atrial fibrillation: Secondary | ICD-10-CM | POA: Diagnosis not present

## 2017-04-27 DIAGNOSIS — E785 Hyperlipidemia, unspecified: Secondary | ICD-10-CM | POA: Diagnosis not present

## 2017-04-27 DIAGNOSIS — E1151 Type 2 diabetes mellitus with diabetic peripheral angiopathy without gangrene: Secondary | ICD-10-CM | POA: Diagnosis not present

## 2017-04-27 NOTE — Telephone Encounter (Signed)
Notes recorded by Melvenia Needles, NP on 04/27/2017 at 11:30 AM EDT PNA has resolved,  Cont w/ ov recs Please contact office for sooner follow up if symptoms do not improve or worsen or seek emergency care       Advised pt of results. Pt understood and nothing further is needed.

## 2017-04-28 DIAGNOSIS — I4891 Unspecified atrial fibrillation: Secondary | ICD-10-CM | POA: Diagnosis not present

## 2017-04-28 DIAGNOSIS — I110 Hypertensive heart disease with heart failure: Secondary | ICD-10-CM | POA: Diagnosis not present

## 2017-04-28 DIAGNOSIS — F419 Anxiety disorder, unspecified: Secondary | ICD-10-CM | POA: Diagnosis not present

## 2017-04-28 DIAGNOSIS — Z7951 Long term (current) use of inhaled steroids: Secondary | ICD-10-CM | POA: Diagnosis not present

## 2017-04-28 DIAGNOSIS — Z4800 Encounter for change or removal of nonsurgical wound dressing: Secondary | ICD-10-CM | POA: Diagnosis not present

## 2017-04-28 DIAGNOSIS — L89151 Pressure ulcer of sacral region, stage 1: Secondary | ICD-10-CM | POA: Diagnosis not present

## 2017-04-28 DIAGNOSIS — Z7901 Long term (current) use of anticoagulants: Secondary | ICD-10-CM | POA: Diagnosis not present

## 2017-04-28 DIAGNOSIS — I5021 Acute systolic (congestive) heart failure: Secondary | ICD-10-CM | POA: Diagnosis not present

## 2017-04-28 DIAGNOSIS — C8590 Non-Hodgkin lymphoma, unspecified, unspecified site: Secondary | ICD-10-CM | POA: Diagnosis not present

## 2017-04-28 DIAGNOSIS — E1151 Type 2 diabetes mellitus with diabetic peripheral angiopathy without gangrene: Secondary | ICD-10-CM | POA: Diagnosis not present

## 2017-04-28 DIAGNOSIS — E785 Hyperlipidemia, unspecified: Secondary | ICD-10-CM | POA: Diagnosis not present

## 2017-04-30 ENCOUNTER — Telehealth: Payer: Self-pay | Admitting: Pulmonary Disease

## 2017-04-30 NOTE — Procedures (Signed)
   Patient Name: [name], [name] Date: 04/20/2017 Gender: Male D.O.B: [birth year] Age (years): 61 Referring Provider: Reesa Chew PA-C Height (inches): 71 Interpreting Physician: Chesley Mires MD, ABSM Weight (lbs): 297 RPSGT: Jorge Ny BMI: 41 MRN: [id number] Neck Size: 19.50  CLINICAL INFORMATION Sleep Study Type: NPSG  Indication for sleep study: Congestive Heart Failure, Diabetes, Hypertension, Obesity, OSA, Snoring  Epworth Sleepiness Score: 9  SLEEP STUDY TECHNIQUE As per the AASM Manual for the Scoring of Sleep and Associated Events v2.3 (April 2016) with a hypopnea requiring 4% desaturations.  The channels recorded and monitored were frontal, central and occipital EEG, electrooculogram (EOG), submentalis EMG (chin), nasal and oral airflow, thoracic and abdominal wall motion, anterior tibialis EMG, snore microphone, electrocardiogram, and pulse oximetry.  MEDICATIONS Medications self-administered by patient taken the night of the study : ALBUTEROL, COREG, MELATONIN, ELIQUIS, POTASSIUM CHLORIDE SA  SLEEP ARCHITECTURE The study was initiated at 10:22:18 PM and ended at 4:35:51 AM.  Sleep onset time was 39.4 minutes and the sleep efficiency was 64.0%%. The total sleep time was 239.0 minutes.  Stage REM latency was 215.0 minutes.  The patient spent 10.9%% of the night in stage N1 sleep, 84.9%% in stage N2 sleep, 0.0%% in stage N3 and 4.18% in REM.  Alpha intrusion was absent.  Supine sleep was 100.00%.  RESPIRATORY PARAMETERS The overall apnea/hypopnea index (AHI) was 8.0 per hour. There were 0 total apneas, including 0 obstructive, 0 central and 0 mixed apneas. There were 32 hypopneas and 1 RERAs.  The AHI during Stage REM sleep was 66.0 per hour.  AHI while supine was 8.0 per hour.  The mean oxygen saturation was 91.3%. The minimum SpO2 during sleep was 67.0%.  soft snoring was noted during this study.  CARDIAC DATA The 2 lead EKG demonstrated  sinus rhythm. The mean heart rate was 84.6 beats per minute. Other EKG findings include: PVCs.  LEG MOVEMENT DATA The total PLMS were 0 with a resulting PLMS index of 0.0. Associated arousal with leg movement index was 10.8 .  IMPRESSIONS - Overall mild obstructive sleep apnea with an AHI of 8 and SpO2 low of 67%.  He had predominance of events in REM sleep with a REM AHI of 66.  DIAGNOSIS - Obstructive Sleep Apnea (327.23 [G47.33 ICD-10])  RECOMMENDATIONS - Additional therapies include weight loss, CPAP, oral appliance, or surgical assessment.  [Electronically signed] 04/30/2017 11:08 AM  Chesley Mires MD, ABSM Diplomate, American Board of Sleep Medicine NPI: [id number]

## 2017-04-30 NOTE — Telephone Encounter (Signed)
PSG 04/20/17 >> AHI 8, SpO2 low 67%.  REM AHI 66.   Will have my nurse inform pt that sleep study shows mild sleep apnea.  He is schedule for follow up with Dr. Lamonte Sakai.    Will route message to Dr. Lamonte Sakai to arrange for further therapy of his sleep apnea.

## 2017-05-01 NOTE — Telephone Encounter (Signed)
Notified patient about mild sleep apnea results.  Patient stated understanding and that he has an appointment 06/19/17 with Dr Lamonte Sakai. Nothing further needed at this time.

## 2017-05-02 DIAGNOSIS — L89151 Pressure ulcer of sacral region, stage 1: Secondary | ICD-10-CM | POA: Diagnosis not present

## 2017-05-02 DIAGNOSIS — E1151 Type 2 diabetes mellitus with diabetic peripheral angiopathy without gangrene: Secondary | ICD-10-CM | POA: Diagnosis not present

## 2017-05-02 DIAGNOSIS — F419 Anxiety disorder, unspecified: Secondary | ICD-10-CM | POA: Diagnosis not present

## 2017-05-02 DIAGNOSIS — Z7901 Long term (current) use of anticoagulants: Secondary | ICD-10-CM | POA: Diagnosis not present

## 2017-05-02 DIAGNOSIS — I5021 Acute systolic (congestive) heart failure: Secondary | ICD-10-CM | POA: Diagnosis not present

## 2017-05-02 DIAGNOSIS — C8590 Non-Hodgkin lymphoma, unspecified, unspecified site: Secondary | ICD-10-CM | POA: Diagnosis not present

## 2017-05-02 DIAGNOSIS — Z4800 Encounter for change or removal of nonsurgical wound dressing: Secondary | ICD-10-CM | POA: Diagnosis not present

## 2017-05-02 DIAGNOSIS — I110 Hypertensive heart disease with heart failure: Secondary | ICD-10-CM | POA: Diagnosis not present

## 2017-05-02 DIAGNOSIS — Z7951 Long term (current) use of inhaled steroids: Secondary | ICD-10-CM | POA: Diagnosis not present

## 2017-05-02 DIAGNOSIS — I4891 Unspecified atrial fibrillation: Secondary | ICD-10-CM | POA: Diagnosis not present

## 2017-05-02 DIAGNOSIS — E785 Hyperlipidemia, unspecified: Secondary | ICD-10-CM | POA: Diagnosis not present

## 2017-05-03 ENCOUNTER — Other Ambulatory Visit: Payer: Self-pay | Admitting: Physical Medicine & Rehabilitation

## 2017-05-03 DIAGNOSIS — C8590 Non-Hodgkin lymphoma, unspecified, unspecified site: Secondary | ICD-10-CM | POA: Diagnosis not present

## 2017-05-03 DIAGNOSIS — I4891 Unspecified atrial fibrillation: Secondary | ICD-10-CM | POA: Diagnosis not present

## 2017-05-03 DIAGNOSIS — Z4800 Encounter for change or removal of nonsurgical wound dressing: Secondary | ICD-10-CM | POA: Diagnosis not present

## 2017-05-03 DIAGNOSIS — Z7901 Long term (current) use of anticoagulants: Secondary | ICD-10-CM | POA: Diagnosis not present

## 2017-05-03 DIAGNOSIS — F419 Anxiety disorder, unspecified: Secondary | ICD-10-CM | POA: Diagnosis not present

## 2017-05-03 DIAGNOSIS — L89151 Pressure ulcer of sacral region, stage 1: Secondary | ICD-10-CM | POA: Diagnosis not present

## 2017-05-03 DIAGNOSIS — E785 Hyperlipidemia, unspecified: Secondary | ICD-10-CM | POA: Diagnosis not present

## 2017-05-03 DIAGNOSIS — I110 Hypertensive heart disease with heart failure: Secondary | ICD-10-CM | POA: Diagnosis not present

## 2017-05-03 DIAGNOSIS — I5021 Acute systolic (congestive) heart failure: Secondary | ICD-10-CM | POA: Diagnosis not present

## 2017-05-03 DIAGNOSIS — C8210 Follicular lymphoma grade II, unspecified site: Secondary | ICD-10-CM

## 2017-05-03 DIAGNOSIS — Z7951 Long term (current) use of inhaled steroids: Secondary | ICD-10-CM | POA: Diagnosis not present

## 2017-05-03 DIAGNOSIS — E1151 Type 2 diabetes mellitus with diabetic peripheral angiopathy without gangrene: Secondary | ICD-10-CM | POA: Diagnosis not present

## 2017-05-04 DIAGNOSIS — F419 Anxiety disorder, unspecified: Secondary | ICD-10-CM | POA: Diagnosis not present

## 2017-05-04 DIAGNOSIS — L89151 Pressure ulcer of sacral region, stage 1: Secondary | ICD-10-CM | POA: Diagnosis not present

## 2017-05-04 DIAGNOSIS — E785 Hyperlipidemia, unspecified: Secondary | ICD-10-CM | POA: Diagnosis not present

## 2017-05-04 DIAGNOSIS — C8590 Non-Hodgkin lymphoma, unspecified, unspecified site: Secondary | ICD-10-CM | POA: Diagnosis not present

## 2017-05-04 DIAGNOSIS — I5021 Acute systolic (congestive) heart failure: Secondary | ICD-10-CM | POA: Diagnosis not present

## 2017-05-04 DIAGNOSIS — I110 Hypertensive heart disease with heart failure: Secondary | ICD-10-CM | POA: Diagnosis not present

## 2017-05-04 DIAGNOSIS — Z7951 Long term (current) use of inhaled steroids: Secondary | ICD-10-CM | POA: Diagnosis not present

## 2017-05-04 DIAGNOSIS — E1151 Type 2 diabetes mellitus with diabetic peripheral angiopathy without gangrene: Secondary | ICD-10-CM | POA: Diagnosis not present

## 2017-05-04 DIAGNOSIS — I4891 Unspecified atrial fibrillation: Secondary | ICD-10-CM | POA: Diagnosis not present

## 2017-05-04 DIAGNOSIS — Z4800 Encounter for change or removal of nonsurgical wound dressing: Secondary | ICD-10-CM | POA: Diagnosis not present

## 2017-05-04 DIAGNOSIS — Z7901 Long term (current) use of anticoagulants: Secondary | ICD-10-CM | POA: Diagnosis not present

## 2017-05-05 ENCOUNTER — Telehealth: Payer: Self-pay

## 2017-05-05 DIAGNOSIS — E1151 Type 2 diabetes mellitus with diabetic peripheral angiopathy without gangrene: Secondary | ICD-10-CM | POA: Diagnosis not present

## 2017-05-05 DIAGNOSIS — Z7901 Long term (current) use of anticoagulants: Secondary | ICD-10-CM | POA: Diagnosis not present

## 2017-05-05 DIAGNOSIS — F419 Anxiety disorder, unspecified: Secondary | ICD-10-CM | POA: Diagnosis not present

## 2017-05-05 DIAGNOSIS — C8590 Non-Hodgkin lymphoma, unspecified, unspecified site: Secondary | ICD-10-CM | POA: Diagnosis not present

## 2017-05-05 DIAGNOSIS — E785 Hyperlipidemia, unspecified: Secondary | ICD-10-CM | POA: Diagnosis not present

## 2017-05-05 DIAGNOSIS — Z7951 Long term (current) use of inhaled steroids: Secondary | ICD-10-CM | POA: Diagnosis not present

## 2017-05-05 DIAGNOSIS — I4891 Unspecified atrial fibrillation: Secondary | ICD-10-CM | POA: Diagnosis not present

## 2017-05-05 DIAGNOSIS — L89151 Pressure ulcer of sacral region, stage 1: Secondary | ICD-10-CM | POA: Diagnosis not present

## 2017-05-05 DIAGNOSIS — I5021 Acute systolic (congestive) heart failure: Secondary | ICD-10-CM | POA: Diagnosis not present

## 2017-05-05 DIAGNOSIS — I110 Hypertensive heart disease with heart failure: Secondary | ICD-10-CM | POA: Diagnosis not present

## 2017-05-05 DIAGNOSIS — Z4800 Encounter for change or removal of nonsurgical wound dressing: Secondary | ICD-10-CM | POA: Diagnosis not present

## 2017-05-05 NOTE — Telephone Encounter (Signed)
[name] PT [facility] called today requesting an extension of verbal orders for 2xwk X 2wks.  Called her back and approved verbal orders.

## 2017-05-07 DIAGNOSIS — R5381 Other malaise: Secondary | ICD-10-CM | POA: Diagnosis not present

## 2017-05-08 ENCOUNTER — Ambulatory Visit: Payer: BLUE CROSS/BLUE SHIELD | Admitting: Internal Medicine

## 2017-05-09 DIAGNOSIS — C8590 Non-Hodgkin lymphoma, unspecified, unspecified site: Secondary | ICD-10-CM | POA: Diagnosis not present

## 2017-05-09 DIAGNOSIS — E1151 Type 2 diabetes mellitus with diabetic peripheral angiopathy without gangrene: Secondary | ICD-10-CM | POA: Diagnosis not present

## 2017-05-09 DIAGNOSIS — Z4800 Encounter for change or removal of nonsurgical wound dressing: Secondary | ICD-10-CM | POA: Diagnosis not present

## 2017-05-09 DIAGNOSIS — Z7951 Long term (current) use of inhaled steroids: Secondary | ICD-10-CM | POA: Diagnosis not present

## 2017-05-09 DIAGNOSIS — Z7901 Long term (current) use of anticoagulants: Secondary | ICD-10-CM | POA: Diagnosis not present

## 2017-05-09 DIAGNOSIS — I4891 Unspecified atrial fibrillation: Secondary | ICD-10-CM | POA: Diagnosis not present

## 2017-05-09 DIAGNOSIS — E785 Hyperlipidemia, unspecified: Secondary | ICD-10-CM | POA: Diagnosis not present

## 2017-05-09 DIAGNOSIS — I5021 Acute systolic (congestive) heart failure: Secondary | ICD-10-CM | POA: Diagnosis not present

## 2017-05-09 DIAGNOSIS — L89151 Pressure ulcer of sacral region, stage 1: Secondary | ICD-10-CM | POA: Diagnosis not present

## 2017-05-09 DIAGNOSIS — F419 Anxiety disorder, unspecified: Secondary | ICD-10-CM | POA: Diagnosis not present

## 2017-05-09 DIAGNOSIS — I110 Hypertensive heart disease with heart failure: Secondary | ICD-10-CM | POA: Diagnosis not present

## 2017-05-10 ENCOUNTER — Encounter: Payer: Self-pay | Admitting: Physical Medicine & Rehabilitation

## 2017-05-10 ENCOUNTER — Encounter
Payer: BLUE CROSS/BLUE SHIELD | Attending: Physical Medicine & Rehabilitation | Admitting: Physical Medicine & Rehabilitation

## 2017-05-10 VITALS — BP 109/80 | HR 76 | Ht 72.0 in | Wt 310.0 lb

## 2017-05-10 DIAGNOSIS — E785 Hyperlipidemia, unspecified: Secondary | ICD-10-CM | POA: Insufficient documentation

## 2017-05-10 DIAGNOSIS — R300 Dysuria: Secondary | ICD-10-CM | POA: Diagnosis not present

## 2017-05-10 DIAGNOSIS — I110 Hypertensive heart disease with heart failure: Secondary | ICD-10-CM | POA: Diagnosis not present

## 2017-05-10 DIAGNOSIS — I5021 Acute systolic (congestive) heart failure: Secondary | ICD-10-CM | POA: Diagnosis not present

## 2017-05-10 DIAGNOSIS — Z87891 Personal history of nicotine dependence: Secondary | ICD-10-CM | POA: Insufficient documentation

## 2017-05-10 DIAGNOSIS — R531 Weakness: Secondary | ICD-10-CM | POA: Diagnosis not present

## 2017-05-10 DIAGNOSIS — F419 Anxiety disorder, unspecified: Secondary | ICD-10-CM | POA: Insufficient documentation

## 2017-05-10 DIAGNOSIS — E119 Type 2 diabetes mellitus without complications: Secondary | ICD-10-CM | POA: Insufficient documentation

## 2017-05-10 DIAGNOSIS — C8290 Follicular lymphoma, unspecified, unspecified site: Secondary | ICD-10-CM | POA: Diagnosis not present

## 2017-05-10 DIAGNOSIS — G4733 Obstructive sleep apnea (adult) (pediatric): Secondary | ICD-10-CM | POA: Diagnosis not present

## 2017-05-10 DIAGNOSIS — Z9889 Other specified postprocedural states: Secondary | ICD-10-CM | POA: Diagnosis not present

## 2017-05-10 DIAGNOSIS — R5381 Other malaise: Secondary | ICD-10-CM | POA: Diagnosis not present

## 2017-05-10 NOTE — Progress Notes (Signed)
Subjective:    Patient ID: [name], male    DOB: 1955/02/13, 62 y.o.   MRN: [id number]  HPI  [name] is here in follow-up of his prolonged hospital stay and debility.  He has continued with home health and has been making progress.  Occupational therapy has signed off.  He has been using a walker for basic mobility but advancing to a cane with therapy.  He is eager to drive again.  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  He states that his hours are flexible and that he could gradually return to work depending on his tolerance..   He has ongoing 24 hour care at home. He has been preparing meals. He is independent with activity and mobility.  He did have one fall but luckily did not have any adverse sequelae.  Therapy is working on gait and advancing him to a cane alone.  Uses a walker for longer distances and for even terrains.  He continues to wear his compression stockings which have controlled his edema well.  He would like to go back to his [employer] stockings which are easier to don and off.  All the skin issues have resolved nicely.    Pain Inventory Average Pain 2 Pain Right Now 5 My pain is intermittent and burning  In the last 24 hours, has pain interfered with the following? General activity 0 Relation with others 0 Enjoyment of life 0 What TIME of day is your pain at its worst? morning Sleep (in general) Good  Pain is worse with: some activites Pain improves with: rest, heat/ice, therapy/exercise and medication Relief from Meds: 7  Mobility use a cane use a walker ability to climb steps?  yes do you drive?  no  Function not employed: date last employed .  Neuro/Psych trouble walking loss of taste or smell  Prior Studies Any changes since last visit?  no  Physicians involved in your care Any changes since last visit?  no   Family History  Problem Relation Age of Onset  . Colon cancer Neg Hx   . Pancreatic cancer Neg  Hx   . Stomach cancer Neg Hx   . Rectal cancer Neg Hx    Social History   Socioeconomic History  . Marital status: Divorced    Spouse name: Not on file  . Number of children: Not on file  . Years of education: Not on file  . Highest education level: Not on file  Occupational History  . Not on file  Social Needs  . Financial resource strain: Not on file  . Food insecurity:    Worry: Not on file    Inability: Not on file  . Transportation needs:    Medical: Not on file    Non-medical: Not on file  Tobacco Use  . Smoking status: Former Smoker    Packs/day: 0.50    Years: 20.00    Pack years: 10.00    Last attempt to quit: 01/10/2009    Years since quitting: 8.3  . Smokeless tobacco: Never Used  Substance and Sexual Activity  . Alcohol use: No  . Drug use: No  . Sexual activity: Not on file  Lifestyle  . Physical activity:    Days per week: Not on file    Minutes per session: Not on file  . Stress: Not on file  Relationships  . Social connections:    Talks on phone: Not  on file    Gets together: Not on file    Attends religious service: Not on file    Active member of club or organization: Not on file    Attends meetings of clubs or organizations: Not on file    Relationship status: Not on file  Other Topics Concern  . Not on file  Social History Narrative  . Not on file   Past Surgical History:  Procedure Laterality Date  . BRONCHOSCOPY    . ESOPHAGOGASTRODUODENOSCOPY ENDOSCOPY     Dr Jearld Fenton  . INGUINAL HERNIA REPAIR     2001  . PORTA CATH INSERTION  2011   Past Medical History:  Diagnosis Date  . Acute systolic HF (heart failure) ([place])   . Atrial fibrillation ([place])   . Diabetes mellitus, new onset ([place])   . Dyslipidemia   . Gout attack 01/16/2012  . Hypertension   . nhl dx'd 01/2010  . Venous insufficiency   . Venous stasis ulcers ([facility])    Ht 6' (1.829 m)   Wt (!) 310 lb (140.6 kg)   BMI 42.04 kg/m   Opioid Risk Score:   Fall Risk Score:   `1  Depression screen PHQ 2/9  No flowsheet data found.   Review of Systems  Constitutional: Negative.   HENT: Positive for sinus pressure and sinus pain.   Eyes: Negative.   Respiratory: Negative.   Cardiovascular: Negative.   Gastrointestinal: Negative.   Endocrine: Negative.   Genitourinary: Negative.   Musculoskeletal: Positive for arthralgias, gait problem and myalgias.  Skin: Negative.   Allergic/Immunologic: Negative.   Hematological: Negative.   Psychiatric/Behavioral: Negative.   All other systems reviewed and are negative.      Objective:   Physical Exam General: No acute distress.  Remains obese HEENT: EOMI, oral membranes moist Cards: reg rate  Chest: normal effort Abdomen: Soft, NT, ND Skin: dry, intact Extremities: no edema Neurological: He is alert and oriented to person, place, and time. No cranial nerve deficit. Reasonable insight and awareness Motor: UE: 5/5 prox to distal.normal sense.  Transfers with a sit to stand technique with minimal difficulties today.  Gait is much improved.  He minimally relies upon the walker for weightbearing.  Legs are slightly externally rotated but he has good clearance and form overall.. Musc: pain along right greater troch minimal at best Skin: Skin is warm and dry.  He is wearing bilateral compression stockings on both legs no peripheral edema  -  Psychiatric: Pleasant and appropriate as always        Assessment & Plan:  1. Generalized weakness, limitation in self-care secondary to debility. --continued HHPT.  Could advance to an outpatient setting   -He certainly want to progress him to work however he will need to build up some stamina.  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  Even so when he goes back to work he would need to start probably with 2-4 clients and work his way up as tolerated.   -I will allow him to drive in  approximately 2 weeks time once home health PT has discharged him  2. New onset A fib/DVT Prophylaxis/Anticoagulation:  Eliquis            -cards f/u 3. Pain Management: tylenol prn  -continued  4. Anxiety disorder/Mood: Continue Zoloft  .   5. Neuropsych: This patient is capable of making decisions on his own behalf. 6. Skin/Wound Care:--keep skin  clean and dry.  7. OSA?Acute hypoxic RF: mild sleep apnea, management per pulmonary 9. Acute systolic CHF: Question viral etiology. Monitor weights daily.  -Continue cozaar, Coreg bid, Lasix bid, Lipitor and baby ASA.  10. Borderline T2DM:  Diet controlled 13. Follicular lymphoma: In remission.  14. Chronic BLE edema resolved--he may transition back to his [employer] stockings  15 minutes of face to face patient care time were spent during this visit. All questions were encouraged and answered.   I will see him back around June 19, 2017

## 2017-05-10 NOTE — Patient Instructions (Signed)
PLEASE FEEL FREE TO CALL OUR OFFICE WITH ANY PROBLEMS OR QUESTIONS ([phone])      

## 2017-05-11 DIAGNOSIS — Z4800 Encounter for change or removal of nonsurgical wound dressing: Secondary | ICD-10-CM | POA: Diagnosis not present

## 2017-05-11 DIAGNOSIS — Z7951 Long term (current) use of inhaled steroids: Secondary | ICD-10-CM | POA: Diagnosis not present

## 2017-05-11 DIAGNOSIS — I5021 Acute systolic (congestive) heart failure: Secondary | ICD-10-CM | POA: Diagnosis not present

## 2017-05-11 DIAGNOSIS — E1151 Type 2 diabetes mellitus with diabetic peripheral angiopathy without gangrene: Secondary | ICD-10-CM | POA: Diagnosis not present

## 2017-05-11 DIAGNOSIS — C8590 Non-Hodgkin lymphoma, unspecified, unspecified site: Secondary | ICD-10-CM | POA: Diagnosis not present

## 2017-05-11 DIAGNOSIS — Z7901 Long term (current) use of anticoagulants: Secondary | ICD-10-CM | POA: Diagnosis not present

## 2017-05-11 DIAGNOSIS — I110 Hypertensive heart disease with heart failure: Secondary | ICD-10-CM | POA: Diagnosis not present

## 2017-05-11 DIAGNOSIS — F419 Anxiety disorder, unspecified: Secondary | ICD-10-CM | POA: Diagnosis not present

## 2017-05-11 DIAGNOSIS — L89151 Pressure ulcer of sacral region, stage 1: Secondary | ICD-10-CM | POA: Diagnosis not present

## 2017-05-11 DIAGNOSIS — I4891 Unspecified atrial fibrillation: Secondary | ICD-10-CM | POA: Diagnosis not present

## 2017-05-11 DIAGNOSIS — E785 Hyperlipidemia, unspecified: Secondary | ICD-10-CM | POA: Diagnosis not present

## 2017-05-16 ENCOUNTER — Telehealth: Payer: Self-pay

## 2017-05-16 DIAGNOSIS — F419 Anxiety disorder, unspecified: Secondary | ICD-10-CM | POA: Diagnosis not present

## 2017-05-16 DIAGNOSIS — L89151 Pressure ulcer of sacral region, stage 1: Secondary | ICD-10-CM | POA: Diagnosis not present

## 2017-05-16 DIAGNOSIS — I4891 Unspecified atrial fibrillation: Secondary | ICD-10-CM | POA: Diagnosis not present

## 2017-05-16 DIAGNOSIS — I5021 Acute systolic (congestive) heart failure: Secondary | ICD-10-CM | POA: Diagnosis not present

## 2017-05-16 DIAGNOSIS — I110 Hypertensive heart disease with heart failure: Secondary | ICD-10-CM | POA: Diagnosis not present

## 2017-05-16 DIAGNOSIS — E1151 Type 2 diabetes mellitus with diabetic peripheral angiopathy without gangrene: Secondary | ICD-10-CM | POA: Diagnosis not present

## 2017-05-16 DIAGNOSIS — Z7951 Long term (current) use of inhaled steroids: Secondary | ICD-10-CM | POA: Diagnosis not present

## 2017-05-16 DIAGNOSIS — C8590 Non-Hodgkin lymphoma, unspecified, unspecified site: Secondary | ICD-10-CM | POA: Diagnosis not present

## 2017-05-16 DIAGNOSIS — Z4800 Encounter for change or removal of nonsurgical wound dressing: Secondary | ICD-10-CM | POA: Diagnosis not present

## 2017-05-16 DIAGNOSIS — R5381 Other malaise: Secondary | ICD-10-CM

## 2017-05-16 DIAGNOSIS — E785 Hyperlipidemia, unspecified: Secondary | ICD-10-CM | POA: Diagnosis not present

## 2017-05-16 DIAGNOSIS — Z7901 Long term (current) use of anticoagulants: Secondary | ICD-10-CM | POA: Diagnosis not present

## 2017-05-16 NOTE — Telephone Encounter (Signed)
Stacy PT [facility] called requesting a referral for this patient to Out-Patient PT for [facility] as she is discharging this patient from [facility].

## 2017-05-17 NOTE — Telephone Encounter (Signed)
Order written

## 2017-05-18 DIAGNOSIS — Z7901 Long term (current) use of anticoagulants: Secondary | ICD-10-CM | POA: Diagnosis not present

## 2017-05-18 DIAGNOSIS — C8590 Non-Hodgkin lymphoma, unspecified, unspecified site: Secondary | ICD-10-CM | POA: Diagnosis not present

## 2017-05-18 DIAGNOSIS — L89151 Pressure ulcer of sacral region, stage 1: Secondary | ICD-10-CM | POA: Diagnosis not present

## 2017-05-18 DIAGNOSIS — I110 Hypertensive heart disease with heart failure: Secondary | ICD-10-CM | POA: Diagnosis not present

## 2017-05-18 DIAGNOSIS — E1151 Type 2 diabetes mellitus with diabetic peripheral angiopathy without gangrene: Secondary | ICD-10-CM | POA: Diagnosis not present

## 2017-05-18 DIAGNOSIS — E785 Hyperlipidemia, unspecified: Secondary | ICD-10-CM | POA: Diagnosis not present

## 2017-05-18 DIAGNOSIS — I5021 Acute systolic (congestive) heart failure: Secondary | ICD-10-CM | POA: Diagnosis not present

## 2017-05-18 DIAGNOSIS — Z7951 Long term (current) use of inhaled steroids: Secondary | ICD-10-CM | POA: Diagnosis not present

## 2017-05-18 DIAGNOSIS — Z4800 Encounter for change or removal of nonsurgical wound dressing: Secondary | ICD-10-CM | POA: Diagnosis not present

## 2017-05-18 DIAGNOSIS — I4891 Unspecified atrial fibrillation: Secondary | ICD-10-CM | POA: Diagnosis not present

## 2017-05-18 DIAGNOSIS — F419 Anxiety disorder, unspecified: Secondary | ICD-10-CM | POA: Diagnosis not present

## 2017-05-22 ENCOUNTER — Ambulatory Visit: Payer: BLUE CROSS/BLUE SHIELD | Admitting: Cardiology

## 2017-05-22 ENCOUNTER — Encounter: Payer: Self-pay | Admitting: Cardiology

## 2017-05-22 VITALS — BP 92/58 | HR 99 | Ht 72.0 in | Wt 312.0 lb

## 2017-05-22 DIAGNOSIS — E119 Type 2 diabetes mellitus without complications: Secondary | ICD-10-CM | POA: Diagnosis not present

## 2017-05-22 DIAGNOSIS — I429 Cardiomyopathy, unspecified: Secondary | ICD-10-CM | POA: Diagnosis not present

## 2017-05-22 DIAGNOSIS — J80 Acute respiratory distress syndrome: Secondary | ICD-10-CM

## 2017-05-22 DIAGNOSIS — I4891 Unspecified atrial fibrillation: Secondary | ICD-10-CM

## 2017-05-22 DIAGNOSIS — I4510 Unspecified right bundle-branch block: Secondary | ICD-10-CM | POA: Diagnosis not present

## 2017-05-22 DIAGNOSIS — I959 Hypotension, unspecified: Secondary | ICD-10-CM | POA: Diagnosis not present

## 2017-05-22 DIAGNOSIS — Z7901 Long term (current) use of anticoagulants: Secondary | ICD-10-CM | POA: Diagnosis not present

## 2017-05-22 DIAGNOSIS — C8212 Follicular lymphoma grade II, intrathoracic lymph nodes: Secondary | ICD-10-CM

## 2017-05-22 DIAGNOSIS — G4733 Obstructive sleep apnea (adult) (pediatric): Secondary | ICD-10-CM | POA: Diagnosis not present

## 2017-05-22 NOTE — Assessment & Plan Note (Signed)
Limiting medical Rx 

## 2017-05-22 NOTE — Assessment & Plan Note (Signed)
Admitted 02/16/17-03/22/17, then went to rehab till 04/05/17

## 2017-05-22 NOTE — Progress Notes (Signed)
05/22/2017 [name]   Dec 15, 1955  [id number]  Primary Physician [name], MD Primary Cardiologist: Dr Percival Spanish  HPI:  62 y/o obese male with a history of prolonged hospitalization this winter after he presented with respiratory failure and ARDS felt to be secondary to influenza. The patient was hospitalized from 02/16/17- 03/22/17, then to rehab from 3/13/9-3/27/19. His hospital course was complicated by respiratory failure requiring tracheostomy, CHF, cardiomyopathy, atrial fibrillation, and DM. He He has been recovering at home with PT and is making progress. He can now walk up a flight of stairs. His EF was 25%-30% by echo Feb 2019. He has remained in atrial fibrillation. His B/P has been low limiting up titration of his medications. Dr Percival Spanish wants to pursue cardioversion but we wanted to get him on C-pap first. The pt had his sleep study in April, the results are still pending. He has a f/u with Dr Lamonte Sakai in June. Symptomatically he "feels great". No syncope, near syncope tachycardia. He has DOE but this is improving with PT.    Current Outpatient Medications  Medication Sig Dispense Refill  . albuterol (PROVENTIL HFA;VENTOLIN HFA) 108 (90 Base) MCG/ACT inhaler Inhale 2 puffs into the lungs every 6 (six) hours as needed for wheezing or shortness of breath. 1 Inhaler 2  . atorvastatin (LIPITOR) 20 MG tablet TAKE 1 TABLET BY MOUTH EVERY DAY 30 tablet 0  . carvedilol (COREG) 25 MG tablet TAKE 1 TABLET (25 MG TOTAL) BY MOUTH 2 (TWO) TIMES DAILY WITH A MEAL. 60 tablet 0  . ELIQUIS 5 MG TABS tablet TAKE 1 TABLET BY MOUTH TWICE A DAY 60 tablet 0  . febuxostat (ULORIC) 40 MG tablet Take 1 tablet (40 mg total) by mouth daily. 30 tablet 0  . furosemide (LASIX) 40 MG tablet TAKE 1 AND 1/2 TABLETS BY MOUTH 2 TIMES DAILY 90 tablet 0  . KLOR-CON M20 20 MEQ tablet TAKE 2 TABLETS BY MOUTH 2 TIMES DAILY 120 tablet 0  . losartan (COZAAR) 25 MG tablet TAKE 1 TABLET BY MOUTH EVERY DAY 30 tablet 0  .  Melatonin 3 MG TABS Take 2 tablets (6 mg total) by mouth at bedtime. 60 tablet 0  . Multiple Vitamin (MULTIVITAMIN WITH MINERALS) TABS tablet Take 1 tablet by mouth daily.    . pantoprazole (PROTONIX) 40 MG tablet TAKE 1 TABLET BY MOUTH EVERY DAY 30 tablet 0  . Probiotic Product (PROBIOTIC DAILY PO) Take by mouth.    . sertraline (ZOLOFT) 50 MG tablet Take 1 tablet (50 mg total) by mouth daily. 30 tablet 0  . sodium chloride (OCEAN) 0.65 % SOLN nasal spray Place 2 sprays into both nostrils as needed for congestion.  0  . TURMERIC PO Take by mouth.    . vitamin C (VITAMIN C) 500 MG tablet Take 1 tablet (500 mg total) by mouth 3 (three) times daily.     No current facility-administered medications for this visit.    Facility-Administered Medications Ordered in Other Visits  Medication Dose Route Frequency Provider Last Rate Last Dose  . sodium chloride 0.9 % injection 10 mL  10 mL Intravenous PRN Curt Bears, MD   10 mL at 05/04/15 1529    Allergies  Allergen Reactions  . Latex Hives and Swelling  . Penicillins Swelling    Age [age] - pt states he had swelling all over and his lips got real big    Past Medical History:  Diagnosis Date  . Acute systolic HF (  heart failure) ([place])   . Atrial fibrillation ([place])   . Diabetes mellitus, new onset ([place])   . Dyslipidemia   . Gout attack 01/16/2012  . Hypertension   . nhl dx'd 01/2010  . Venous insufficiency   . Venous stasis ulcers ([facility])     Social History   Socioeconomic History  . Marital status: Divorced    Spouse name: Not on file  . Number of children: Not on file  . Years of education: Not on file  . Highest education level: Not on file  Occupational History  . Not on file  Social Needs  . Financial resource strain: Not on file  . Food insecurity:    Worry: Not on file    Inability: Not on file  . Transportation needs:    Medical: Not on file    Non-medical: Not on file  Tobacco Use  . Smoking status: Former  Smoker    Packs/day: 0.50    Years: 20.00    Pack years: 10.00    Last attempt to quit: 01/10/2009    Years since quitting: 8.3  . Smokeless tobacco: Never Used  Substance and Sexual Activity  . Alcohol use: No  . Drug use: No  . Sexual activity: Not on file  Lifestyle  . Physical activity:    Days per week: Not on file    Minutes per session: Not on file  . Stress: Not on file  Relationships  . Social connections:    Talks on phone: Not on file    Gets together: Not on file    Attends religious service: Not on file    Active member of club or organization: Not on file    Attends meetings of clubs or organizations: Not on file    Relationship status: Not on file  . Intimate partner violence:    Fear of current or ex partner: Not on file    Emotionally abused: Not on file    Physically abused: Not on file    Forced sexual activity: Not on file  Other Topics Concern  . Not on file  Social History Narrative  . Not on file     Family History  Problem Relation Age of Onset  . Colon cancer Neg Hx   . Pancreatic cancer Neg Hx   . Stomach cancer Neg Hx   . Rectal cancer Neg Hx      Review of Systems: General: negative for chills, fever, night sweats or weight changes.  Cardiovascular: negative for chest pain, dyspnea on exertion, edema, orthopnea, palpitations, paroxysmal nocturnal dyspnea or shortness of breath Dermatological: negative for rash Respiratory: negative for cough or wheezing Urologic: negative for hematuria Abdominal: negative for nausea, vomiting, diarrhea, bright red blood per rectum, melena, or hematemesis Neurologic: negative for visual changes, syncope, or dizziness All other systems reviewed and are otherwise negative except as noted above.    Blood pressure (!) 92/58, pulse 99, height 6' (1.829 m), weight (!) 312 lb (141.5 kg).  General appearance: alert, cooperative, no distress and morbidly obese Lungs: clear to auscultation bilaterally Heart:  irregularly irregular rhythm Extremities: 1+ LE edema Skin: Skin color, texture, turgor normal. No rashes or lesions Neurologic: Grossly normal  EKG AF with RBBB, HR 99  ASSESSMENT AND PLAN:   Acute respiratory distress syndrome (ARDS) ([place]) Admitted 02/16/17-03/22/17, then went to rehab till 04/05/17  Cardiomyopathy ([place]) EF 25%- if this doesn't improve plan for ischemic evaluation.  Hypotension Limiting medical Rx  OSA (obstructive  sleep apnea) Awaiting sleep study results  RBBB . Chronic anticoagulation CHADs VASC=4   PLAN  No room to increase medications. I will arrange for an echo in July (after he has been on C-pap if indicated) and then a f/u with Dr Percival Spanish. I did order a BMP and CBC today and will follow these up. I should note his weight has steadily increased since discharge from in-patient rehab- 299 lbs on 3/28, he is 312 lbs today.   Kerin Ransom PA-C 05/22/2017 3:24 PM

## 2017-05-22 NOTE — Patient Instructions (Signed)
Medication Instructions:  Your physician recommends that you continue on your current medications as directed. Please refer to the Current Medication list given to you today.  Labwork: Your physician recommends that you return for lab work in: TODAY-BMET, CBC  Testing/Procedures: [facility] has requested that you have an echocardiogram. Echocardiography is a painless test that uses sound waves to create images of your heart. It provides your doctor with information about the size and shape of your heart and how well your heart's chambers and valves are working. This procedure takes approximately one hour. There are no restrictions for this procedure. [address]  Follow-Up: Your physician recommends that you schedule a follow-up appointment in: 1 WEEK AFTER ECHO MID-July with DR [facility] ONLY  Any Other Special Instructions Will Be Listed Below (If Applicable).  If you need a refill on your cardiac medications before your next appointment, please call your pharmacy.

## 2017-05-22 NOTE — Assessment & Plan Note (Signed)
EF 25%- if this doesn't improve plan for ischemic evaluation.

## 2017-05-22 NOTE — Assessment & Plan Note (Signed)
CHADs VASC=4

## 2017-05-22 NOTE — Assessment & Plan Note (Signed)
Awaiting sleep study results.

## 2017-05-23 ENCOUNTER — Ambulatory Visit: Payer: BLUE CROSS/BLUE SHIELD | Attending: Physical Medicine & Rehabilitation | Admitting: Physical Therapy

## 2017-05-23 ENCOUNTER — Other Ambulatory Visit: Payer: Self-pay

## 2017-05-23 ENCOUNTER — Encounter: Payer: Self-pay | Admitting: Physical Therapy

## 2017-05-23 DIAGNOSIS — R262 Difficulty in walking, not elsewhere classified: Secondary | ICD-10-CM | POA: Insufficient documentation

## 2017-05-23 DIAGNOSIS — M6281 Muscle weakness (generalized): Secondary | ICD-10-CM | POA: Diagnosis not present

## 2017-05-23 DIAGNOSIS — J9601 Acute respiratory failure with hypoxia: Secondary | ICD-10-CM | POA: Insufficient documentation

## 2017-05-23 DIAGNOSIS — J9602 Acute respiratory failure with hypercapnia: Secondary | ICD-10-CM | POA: Insufficient documentation

## 2017-05-23 DIAGNOSIS — Z930 Tracheostomy status: Secondary | ICD-10-CM | POA: Insufficient documentation

## 2017-05-23 DIAGNOSIS — R5381 Other malaise: Secondary | ICD-10-CM | POA: Diagnosis not present

## 2017-05-23 DIAGNOSIS — C8210 Follicular lymphoma grade II, unspecified site: Secondary | ICD-10-CM | POA: Diagnosis not present

## 2017-05-23 LAB — BASIC METABOLIC PANEL
BUN/Creatinine Ratio: 12 (ref 10–24)
BUN: 12 mg/dL (ref 8–27)
CO2: 20 mmol/L (ref 20–29)
Calcium: 9.1 mg/dL (ref 8.6–10.2)
Chloride: 97 mmol/L (ref 96–106)
Creatinine, Ser: 1.02 mg/dL (ref 0.76–1.27)
GFR calc Af Amer: 91 mL/min/{1.73_m2} (ref >59)
GFR calc non Af Amer: 79 mL/min/{1.73_m2} (ref >59)
Glucose: 98 mg/dL (ref 65–99)
Potassium: 4.5 mmol/L (ref 3.5–5.2)
Sodium: 137 mmol/L (ref 134–144)

## 2017-05-23 LAB — CBC
Hematocrit: 39.4 % (ref 37.5–51.0)
Hemoglobin: 12.4 g/dL — ABNORMAL LOW (ref 13.0–17.7)
MCH: 28.5 pg (ref 26.6–33.0)
MCHC: 31.5 g/dL (ref 31.5–35.7)
MCV: 91 fL (ref 79–97)
Platelets: 262 10*3/uL (ref 150–379)
RBC: 4.35 x10E6/uL (ref 4.14–5.80)
RDW: 17 % — ABNORMAL HIGH (ref 12.3–15.4)
WBC: 9.6 10*3/uL (ref 3.4–10.8)

## 2017-05-23 NOTE — Therapy (Signed)
[facility] [address], [name], [place], [address] Phone: [phone]   Fax:  [phone]  Physical Therapy Evaluation  Patient Details  Name: [name] MRN: [id number] Date of Birth: October 24, 1955 Referring Provider: Dr Naaman Plummer   Encounter Date: 05/23/2017  PT End of Session - 05/23/17 1714    Visit Number  1    Number of Visits  12    Date for PT Re-Evaluation  07/18/17    Authorization Type  30 visit limit;  had 12 visits HHPT, 6 visits HHOT = 18 visits;  leaves 12 visits for outpatient PT     Authorization - Number of Visits  12    PT Start Time  1615    PT Stop Time  1700    PT Time Calculation (min)  45 min    Activity Tolerance  Patient tolerated treatment well       Past Medical History:  Diagnosis Date  . Acute systolic HF (heart failure) ([place])   . Atrial fibrillation ([place])   . Diabetes mellitus, new onset ([place])   . Dyslipidemia   . Gout attack 01/16/2012  . Hypertension   . nhl dx'd 01/2010  . Venous insufficiency   . Venous stasis ulcers (HCC)     Past Surgical History:  Procedure Laterality Date  . BRONCHOSCOPY    . ESOPHAGOGASTRODUODENOSCOPY ENDOSCOPY     Dr Jearld Fenton  . INGUINAL HERNIA REPAIR     2001  . PORTA CATH INSERTION  2011    There were no vitals filed for this visit.   Subjective Assessment - 05/23/17 1615    Subjective  Got sick 02/16/17 when I couldn't breath called EMS.  My heart "went out".  ICU for 1 month for flu, pneumonia, sepsis and heart failure.  Tracheotomy and ventilator.  Step down for 2-3 weeks then 2 weeks in inpatient rehab discharged 04/06/17.  Went home with [place], [place] and nursing.  Had 12 visits PT, 6 visits OT.  Using cane at home and in walked in yard with supervision.  Dipping right hip when walking.  Yesterday was my first day back to work stood and sat some.      Patient is accompained by:  Family member caregiver    Pertinent History  gout in left ankle     Limitations  Walking;House hold activities;Standing    How long can you walk comfortably?  with RW 150 feet;  uses straight cane at home     Patient Stated Goals  walk with the cane full time or nothing at all; get breathing better with activity     Currently in Pain?  Yes    Pain Score  7  "this is typical for me b/c of gout"    Pain Location  Ankle    Pain Orientation  Left    Pain Type  Chronic pain    Pain Onset  More than a month ago    Pain Frequency  Constant    Aggravating Factors   not really         [facility] PT Assessment - 05/23/17 0001      Assessment   Medical Diagnosis  debility    Referring Provider  Dr Naaman Plummer    Onset Date/Surgical Date  02/16/17    Next MD Visit  June    Prior Therapy  12 visits PT, 6 OT      Precautions   Precautions  None  Precaution Comments  limit salt      Restrictions   Weight Bearing Restrictions  No    Other Position/Activity Restrictions  1/2 days at work       Balance Screen   Has the patient fallen in the past 6 months  Yes    How many times?  1 slipped off riser getting into bed    Has the patient had a decrease in activity level because of a fear of falling?   No    Is the patient reluctant to leave their home because of a fear of falling?   No      Home Environment   Living Environment  Private residence    Living Arrangements  Other relatives    Available Help at Discharge  Family    Type of [place] to enter    Entrance Stairs-Number of Steps  1    [place]  Two level    Alternate Level Stairs-Number of Steps  [place] - 2 wheels;Cane - single point      Prior Function   Level of Independence  Independent with basic ADLs    Vocation  -- hair dresser    Vocation Requirements  1/2 days     Leisure  play piano and organ; go to [employer] Comments  rounded shoulders       Strength    Right/Left Hip  Right;Left    Right Hip Flexion  4/5    Right Hip Extension  4/5    Right Hip ABduction  4-/5    Left Hip Flexion  4+/5    Left Hip Extension  4/5    Left Hip ABduction  4-/5    Right Knee Flexion  4+/5    Right Knee Extension  4/5    Left Knee Flexion  4+/5    Left Knee Extension  4/5    Right Ankle Dorsiflexion  4/5    Right Ankle Plantar Flexion  4/5    Left Ankle Dorsiflexion  4/5    Left Ankle Plantar Flexion  4/5    Lumbar Flexion  4/5    Lumbar Extension  4/5      Ambulation/Gait   Pre-Gait Activities  with walker slow pace    Gait Comments  with SPC minimal pelvic/hip drop on right       6 Minute Walk- Baseline   6 Minute Walk- Baseline  yes      6 minute walk test results    Endurance additional comments  to be done next visit      Standardized Balance Assessment   Five times sit to stand comments   with UEs on armests 16.61 sec      Berg Balance Test   Sit to Stand  Able to stand  independently using hands    Standing Unsupported  Able to stand safely 2 minutes    Sitting with Back Unsupported but Feet Supported on Floor or Stool  Able to sit safely and securely 2 minutes    Stand to Sit  Controls descent by using hands    Transfers  Able to transfer safely, definite need of hands    Standing Unsupported with Eyes Closed  Able to stand 10 seconds with supervision    Standing Ubsupported with Feet Together  Able to place feet together independently and stand for 1 minute with supervision    From Standing, Reach Forward with Outstretched Arm  Can reach forward >12 cm safely (5")    From Standing Position, Pick up Object from [place] to pick up shoe safely and easily    From Standing Position, Turn to Look Behind Over each Shoulder  Looks behind from both sides and weight shifts well    Turn 360 Degrees  Able to turn 360 degrees safely but slowly    Standing Unsupported, Alternately Place Feet on Step/Stool  Able to complete >2 steps/needs minimal  assist    Standing Unsupported, One Foot in [place] to plae foot ahead of the other independently and hold 30 seconds    Standing on One Leg  Able to lift leg independently and hold equal to or more than 3 seconds    Total Score  42      Timed Up and Go Test   Normal TUG (seconds)  16.41                Objective measurements completed on examination: See above findings.                PT Short Term Goals - 05/23/17 1730      PT SHORT TERM GOAL #1   Title  The patient will be able to ambulate 500 feet with walker needed for increased community ambulation for grocery shopping, return to work, travel    Time  4    Period  Weeks    Status  New    Target Date  06/20/17      PT SHORT TERM GOAL #2   Title  The patient will have improved gait speed with timed up and Go test to 14 sec     Time  4    Period  Weeks    Status  New      PT SHORT TERM GOAL #3   Title  BERG Balance test score improved to 45/56 indicating improved balance and stability     Time  4    Period  Weeks    Status  New      PT SHORT TERM GOAL #4   Title  The patient will have improved LE strength to grossly 4/5 needed for 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    Time  4    Period  Weeks    Status  New        PT Long Term Goals - 05/23/17 1852      PT LONG TERM GOAL #1   Title  The patient will independent in safe self progression of HEP    Time  8    Period  Weeks    Status  New    Target Date  07/18/17      PT LONG TERM GOAL #2   Title  The patient will be able to ambulate with a SPC short to medium community distances 500 feet     Time  8    Period  Weeks    Status  New      PT LONG TERM GOAL #3   Title  The patient will have LE strength to grossly 4+/5 needed to rise from a standard chair with minimal UE use     Time  8    Period  Weeks  Status  New      PT LONG TERM GOAL #4   Title  BERG balance score  improved to 47/56 indicating improved balance and decreased risk for falls    Time  8    Period  Weeks    Status  New      PT LONG TERM GOAL #5   Title  Timed up and Go gait speed improved to 13 sec indicating improved  strength     Time  8    Period  Days    Status  New             Plan - 05/23/17 1716    Clinical Impression Statement  The patient was hospitalized on 02/16/17 with flu, pneumonia, sepsis and heart failure.  He was in ICU for 1 month follwed by a step down unit for 2-3 weeks, then an inpatient rehab stay for 2 weeks.  He was discharged home on 04/06/17 with home health PT, OT and nursing care.  He is now referred to PT for continued PT for LE strengthening and gait training.  He presents with a rolling walker but reports he has been walking with a cane with supervision for short distances only.  He reports his walking maximum with the walker is about 150 feet.  Gait is slow with excessive toe out left > right.  LE strength grossly 4-/5 to 4/5.  He needs significant UE use to rise from a standard chair.  His BERG balance test score is 42/56 indicating mderate risk for falls .  Decreased gait speed with Timed up and Go test 16.41 (normal for age = < 10 sec).  He would benefit from PT to address these deficits.      History and Personal Factors relevant to plan of care:  numerous co-morbidities including:  obesity, heart failure, diabetes, gout in left ankle, HTN, venous insufficiency, PVD with healed wounds, sleep dysfunction, sepsis;  multiple body regions and systems affected    Clinical Presentation  Evolving    Clinical Presentation due to:  cardiorespiratory considerations affecting his activity tolerance is variable    Clinical Decision Making  Moderate    Rehab Potential  Good    Clinical Impairments Affecting Rehab Potential  see numerous co-morbidities above    PT Frequency  2x / week    PT Duration  8 weeks    PT Treatment/Interventions  ADLs/Self Care Home  Management;Electrical Stimulation;Ultrasound;Moist Heat;Cryotherapy;Therapeutic exercise;Balance training;Therapeutic activities;Functional mobility training;Gait training;Stair training;Neuromuscular re-education;Patient/family education;Manual techniques;Taping    PT Next Visit Plan  Do 6 min walk test with O2 sat check;  Nu-Step;  Standing LE strengthening;  leg press    Consulted and Agree with Plan of Care  Patient       Patient will benefit from skilled therapeutic intervention in order to improve the following deficits and impairments:  Pain, Difficulty walking, Decreased balance, Decreased strength, Decreased activity tolerance  Visit Diagnosis: Muscle weakness (generalized) - Plan: PT plan of care cert/re-cert  Difficulty in walking, not elsewhere classified - Plan: PT plan of care cert/re-cert     Problem List Patient Active Problem List   Diagnosis Date Noted  . Chronic anticoagulation 05/22/2017  . Cardiomyopathy ([place]) 05/22/2017  . Hypotension 05/22/2017  . RBBB 05/22/2017  . UTI due to extended-spectrum beta lactamase (ESBL) producing Escherichia coli 04/06/2017  . Debility   . Wound infection   . OSA (obstructive sleep apnea)   . Acute systolic heart failure ([place])   .  Morbid obesity ([place])   . Diabetes mellitus, new onset ([place])   . Dysuria   . Acute respiratory distress syndrome (ARDS) (HCC)   . Influenza A with pneumonia   . New onset atrial fibrillation ([place])   . Anxiety   . Current moderate episode of major depressive disorder without prior episode ([name])   . Obesity, Class III, BMI 40-49.9 (morbid obesity) ([place])   . Community acquired pneumonia   . Critical illness myopathy   . Dysphagia   . Non-Hodgkin's lymphoma ([place])   . Benign essential HTN   . Leukocytosis   . Hypokalemia   . Tracheostomy status ([place])   . Hypoxic   . Pressure injury of skin 02/28/2017  . Status post tracheostomy ([place])   . Follicular lymphoma grade II of intrathoracic lymph nodes  ([place]) 05/02/2016  . Portacath in place 05/04/2015  . Gout attack 01/16/2012  . Follicular lymphoma grade II ([place]) 12/16/2010    Ruben Im, PT 05/23/17 7:01 PM Phone: [phone] Fax: [phone]  Alvera Singh 05/23/2017, 7:00 PM  [facility] [address], [place], [place], [address] Phone: [phone]   Fax:  [phone]  Name: JORI THRALL MRN: [id number] Date of Birth: 1955-04-06

## 2017-05-27 ENCOUNTER — Other Ambulatory Visit: Payer: Self-pay | Admitting: Physical Medicine & Rehabilitation

## 2017-05-27 DIAGNOSIS — C8210 Follicular lymphoma grade II, unspecified site: Secondary | ICD-10-CM

## 2017-05-29 ENCOUNTER — Inpatient Hospital Stay: Payer: BLUE CROSS/BLUE SHIELD | Attending: Internal Medicine

## 2017-05-29 DIAGNOSIS — Z95828 Presence of other vascular implants and grafts: Secondary | ICD-10-CM

## 2017-05-29 DIAGNOSIS — C8210 Follicular lymphoma grade II, unspecified site: Secondary | ICD-10-CM | POA: Diagnosis not present

## 2017-05-29 DIAGNOSIS — Z452 Encounter for adjustment and management of vascular access device: Secondary | ICD-10-CM | POA: Insufficient documentation

## 2017-05-29 MED ORDER — HEPARIN SOD (PORK) LOCK FLUSH 100 UNIT/ML IV SOLN
500.0000 [IU] | Freq: Once | INTRAVENOUS | Status: AC | PRN
  Administered 2017-05-29: 500 [IU] via INTRAVENOUS
  Filled 2017-05-29: qty 5

## 2017-05-29 MED ORDER — SODIUM CHLORIDE 0.9 % IJ SOLN
10.0000 mL | INTRAMUSCULAR | Status: DC | PRN
  Administered 2017-05-29: 10 mL via INTRAVENOUS
  Filled 2017-05-29: qty 10

## 2017-05-31 ENCOUNTER — Ambulatory Visit: Payer: BLUE CROSS/BLUE SHIELD | Admitting: Physical Therapy

## 2017-05-31 ENCOUNTER — Encounter: Payer: Self-pay | Admitting: Physical Therapy

## 2017-05-31 DIAGNOSIS — R5381 Other malaise: Secondary | ICD-10-CM | POA: Diagnosis not present

## 2017-05-31 DIAGNOSIS — J9602 Acute respiratory failure with hypercapnia: Secondary | ICD-10-CM

## 2017-05-31 DIAGNOSIS — Z930 Tracheostomy status: Secondary | ICD-10-CM | POA: Diagnosis not present

## 2017-05-31 DIAGNOSIS — M6281 Muscle weakness (generalized): Secondary | ICD-10-CM

## 2017-05-31 DIAGNOSIS — R262 Difficulty in walking, not elsewhere classified: Secondary | ICD-10-CM

## 2017-05-31 DIAGNOSIS — J9601 Acute respiratory failure with hypoxia: Secondary | ICD-10-CM | POA: Diagnosis not present

## 2017-05-31 DIAGNOSIS — C8210 Follicular lymphoma grade II, unspecified site: Secondary | ICD-10-CM

## 2017-05-31 NOTE — Therapy (Signed)
[facility] [address], [place], [place], [address] Phone: [phone]   Fax:  [phone]  Physical Therapy Treatment  Patient Details  Name: [name] MRN: [id number] Date of Birth: 24-Oct-1955 Referring Provider: Dr Naaman Plummer   Encounter Date: 05/31/2017  PT End of Session - 05/31/17 1454    Visit Number  2    Number of Visits  12    Date for PT Re-Evaluation  07/18/17    Authorization Type  30 visit limit;  had 12 visits HHPT, 6 visits HHOT = 18 visits;  leaves 12 visits for outpatient PT     Authorization - Number of Visits  12    PT Start Time  1454 pt late    PT Stop Time  1530    PT Time Calculation (min)  36 min    Activity Tolerance  Patient tolerated treatment well    Behavior During Therapy  WFL for tasks assessed/performed       Past Medical History:  Diagnosis Date  . Acute systolic HF (heart failure) ([place])   . Atrial fibrillation ([place])   . Diabetes mellitus, new onset ([place])   . Dyslipidemia   . Gout attack 01/16/2012  . Hypertension   . nhl dx'd 01/2010  . Venous insufficiency   . Venous stasis ulcers (HCC)     Past Surgical History:  Procedure Laterality Date  . BRONCHOSCOPY    . ESOPHAGOGASTRODUODENOSCOPY ENDOSCOPY     Dr Jearld Fenton  . INGUINAL HERNIA REPAIR     2001  . PORTA CATH INSERTION  2011    There were no vitals filed for this visit.      [facility] PT Assessment - 05/31/17 0001      6 minute walk test results    Aerobic Endurance Distance Walked  382 feet, O2 Sat pre 98%, down to 92% but held steady.                   [facility] Adult PT Treatment/Exercise - 05/31/17 0001      Knee/Hip Exercises: Aerobic   Nustep  L1 7 min PTA present to monitor      Knee/Hip Exercises: Standing   Heel Raises  Both;1 set;10 reps    Hip Abduction  Stengthening;Both;1 set;10 reps;Knee straight VC to slow speed      Knee/Hip Exercises: Seated   Long Arc Quad  Strengthening;Both;1  set;10 reps    Clamshell with [employer]  Blue 2x10    Sit to [employer]  1 set;10 reps;without UE support               PT Short Term Goals - 05/23/17 1730      PT SHORT TERM GOAL #1   Title  The patient will be able to ambulate 500 feet with walker needed for increased community ambulation for grocery shopping, return to work, travel    Time  4    Period  Weeks    Status  New    Target Date  06/20/17      PT SHORT TERM GOAL #2   Title  The patient will have improved gait speed with timed up and Go test to 14 sec     Time  4    Period  Weeks    Status  New      PT SHORT TERM GOAL #3   Title  BERG Balance test score improved to 45/56 indicating improved balance and stability  Time  4    Period  Weeks    Status  New      PT SHORT TERM GOAL #4   Title  The patient will have improved LE strength to grossly 4/5 needed for 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    Time  4    Period  Weeks    Status  New        PT Long Term Goals - 05/23/17 1852      PT LONG TERM GOAL #1   Title  The patient will independent in safe self progression of HEP    Time  8    Period  Weeks    Status  New    Target Date  07/18/17      PT LONG TERM GOAL #2   Title  The patient will be able to ambulate with a SPC short to medium community distances 500 feet     Time  8    Period  Weeks    Status  New      PT LONG TERM GOAL #3   Title  The patient will have LE strength to grossly 4+/5 needed to rise from a standard chair with minimal UE use     Time  8    Period  Weeks    Status  New      PT LONG TERM GOAL #4   Title  BERG balance score improved to 47/56 indicating improved balance and decreased risk for falls    Time  8    Period  Weeks    Status  New      PT LONG TERM GOAL #5   Title  Timed up and Go gait speed improved to 13 sec indicating improved  strength     Time  8    Period  Days    Status  New             Plan - 05/31/17 1454    Clinical Impression Statement  Pt was able to walk 382 feet with his walker for 6 min. He tolerated both seated and standing exercises and 7 minutes on the Nustep. Pt was monitored throughout and had to stop a few times for shortness of breath. O2 sat dropped to 92% when walking but held steady.     Rehab Potential  Good    Clinical Impairments Affecting Rehab Potential  see numerous co-morbidities above    PT Frequency  2x / week    PT Duration  8 weeks    PT Treatment/Interventions  ADLs/Self Care Home Management;Electrical Stimulation;Ultrasound;Moist Heat;Cryotherapy;Therapeutic exercise;Balance training;Therapeutic activities;Functional mobility training;Gait training;Stair training;Neuromuscular re-education;Patient/family education;Manual techniques;Taping    PT Next Visit Plan   Nu-Step;  Standing LE strengthening; add resisatnce to LAQ, pt may bring his cane next    Consulted and Agree with Plan of Care  Patient       Patient will benefit from skilled therapeutic intervention in order to improve the following deficits and impairments:  Pain, Difficulty walking, Decreased balance, Decreased strength, Decreased activity tolerance  Visit Diagnosis: Muscle weakness (generalized)  Difficulty in walking, not elsewhere classified  Acute respiratory failure with hypoxia and hypercapnia (HCC)  Status post tracheostomy ([place])  Morbid obesity ([place])  Debility  Obesity, Class III, BMI 40-49.9 (morbid obesity) ([place])  Follicular lymphoma grade II, unspecified body region [facility])     Problem List Patient Active Problem List  Diagnosis Date Noted  . Chronic anticoagulation 05/22/2017  . Cardiomyopathy ([place]) 05/22/2017  . Hypotension 05/22/2017  . RBBB 05/22/2017  . UTI due to extended-spectrum beta lactamase (ESBL) producing Escherichia coli 04/06/2017  . Debility   . Wound infection   . OSA (obstructive sleep apnea)   . Acute systolic heart  failure ([place])   . Morbid obesity ([place])   . Diabetes mellitus, new onset ([place])   . Dysuria   . Acute respiratory distress syndrome (ARDS) (HCC)   . Influenza A with pneumonia   . New onset atrial fibrillation ([place])   . Anxiety   . Current moderate episode of major depressive disorder without prior episode ([place])   . Obesity, Class III, BMI 40-49.9 (morbid obesity) ([place])   . Community acquired pneumonia   . Critical illness myopathy   . Dysphagia   . Non-Hodgkin's lymphoma ([place])   . Benign essential HTN   . Leukocytosis   . Hypokalemia   . Tracheostomy status ([place])   . Hypoxic   . Pressure injury of skin 02/28/2017  . Status post tracheostomy ([place])   . Follicular lymphoma grade II of intrathoracic lymph nodes ([place]) 05/02/2016  . Portacath in place 05/04/2015  . Gout attack 01/16/2012  . Follicular lymphoma grade II ([place]) 12/16/2010    Chrles Selley, PTA 05/31/2017, 3:34 PM  [facility] [address] [place], [place], [address] Phone: [phone]   Fax:  [phone]  Name: [name] MRN: [id number] Date of Birth: 1955/10/12

## 2017-06-01 ENCOUNTER — Encounter

## 2017-06-01 ENCOUNTER — Other Ambulatory Visit: Payer: Self-pay | Admitting: Physical Medicine & Rehabilitation

## 2017-06-02 ENCOUNTER — Telehealth: Payer: Self-pay

## 2017-06-02 NOTE — Telephone Encounter (Signed)
Patient called requesting a refill of Klor-con m20 20 meq, no mention in last note if ok to refill, please advise.

## 2017-06-06 ENCOUNTER — Ambulatory Visit: Payer: BLUE CROSS/BLUE SHIELD | Admitting: Physical Therapy

## 2017-06-06 ENCOUNTER — Telehealth: Payer: Self-pay | Admitting: Cardiology

## 2017-06-06 ENCOUNTER — Encounter: Payer: Self-pay | Admitting: Physical Therapy

## 2017-06-06 DIAGNOSIS — R262 Difficulty in walking, not elsewhere classified: Secondary | ICD-10-CM

## 2017-06-06 DIAGNOSIS — M6281 Muscle weakness (generalized): Secondary | ICD-10-CM

## 2017-06-06 DIAGNOSIS — Z930 Tracheostomy status: Secondary | ICD-10-CM | POA: Diagnosis not present

## 2017-06-06 DIAGNOSIS — J9601 Acute respiratory failure with hypoxia: Secondary | ICD-10-CM | POA: Diagnosis not present

## 2017-06-06 DIAGNOSIS — C8210 Follicular lymphoma grade II, unspecified site: Secondary | ICD-10-CM | POA: Diagnosis not present

## 2017-06-06 DIAGNOSIS — R5381 Other malaise: Secondary | ICD-10-CM | POA: Diagnosis not present

## 2017-06-06 DIAGNOSIS — J9602 Acute respiratory failure with hypercapnia: Secondary | ICD-10-CM | POA: Diagnosis not present

## 2017-06-06 MED ORDER — APIXABAN 5 MG PO TABS: 5.0000 mg | ORAL_TABLET | Freq: Two times a day (BID) | ORAL | 3 refills | Status: DC

## 2017-06-06 MED ORDER — POTASSIUM CHLORIDE CRYS ER 20 MEQ PO TBCR: 40.0000 meq | EXTENDED_RELEASE_TABLET | Freq: Two times a day (BID) | ORAL | 3 refills | Status: DC

## 2017-06-06 NOTE — Telephone Encounter (Signed)
This medication was ordered by Dr. Percival Spanish today. It should be managed by primary or cardiology moving forward.

## 2017-06-06 NOTE — Therapy (Signed)
[facility] [address], [place], [place], [address] Phone: [phone]   Fax:  [phone]  Physical Therapy Treatment  Patient Details  Name: [name] MRN: [id number] Date of Birth: 1955/03/30 Referring Provider: Dr Naaman Plummer   Encounter Date: 06/06/2017  PT End of Session - 06/06/17 1613    Visit Number  3    Number of Visits  12    Date for PT Re-Evaluation  07/18/17    Authorization Type  30 visit limit;  had 12 visits HHPT, 6 visits HHOT = 18 visits;  leaves 12 visits for outpatient PT     PT Start Time  1445    PT Stop Time  1530    PT Time Calculation (min)  45 min    Activity Tolerance  Patient tolerated treatment well       Past Medical History:  Diagnosis Date  . Acute systolic HF (heart failure) ([place])   . Atrial fibrillation ([place])   . Diabetes mellitus, new onset ([place])   . Dyslipidemia   . Gout attack 01/16/2012  . Hypertension   . nhl dx'd 01/2010  . Venous insufficiency   . Venous stasis ulcers (HCC)     Past Surgical History:  Procedure Laterality Date  . BRONCHOSCOPY    . ESOPHAGOGASTRODUODENOSCOPY ENDOSCOPY     Dr Jearld Fenton  . INGUINAL HERNIA REPAIR     2001  . PORTA CATH INSERTION  2011    There were no vitals filed for this visit.  Subjective Assessment - 06/06/17 1442    Subjective  Working 5-6 hours a day using RW.   Presents with [facility] today.  I'm putting my compression socks on but it bothers my left hip.  Patient/caregiver request exercises to do in the pool while at the beach next visit.      Patient is accompained by:  Family member caregiver    Currently in Pain?  Yes    Pain Score  6     Pain Location  Ankle    Pain Orientation  Left    Pain Type  Chronic pain                       [facility] Adult PT Treatment/Exercise - 06/06/17 0001      Lumbar Exercises: Seated   Other Seated Lumbar Exercises  foam roll push down 10x    Other Seated Lumbar Exercises  2#  red plyo ball chops 10x each way      Knee/Hip Exercises: Stretches   Active Hamstring Stretch  Right;Left;5 reps seated    Other Knee/Hip Stretches  heel to shin slides 10x right/left      Knee/Hip Exercises: Aerobic   Nustep  Seat 13, arms 11, 10 min L1 while discussing progress with PT      Knee/Hip Exercises: Standing   Heel Raises  Both;1 set;10 reps    Hip Abduction  Stengthening;Both;1 set;10 reps;Knee straight VC to slow speed    Abduction Limitations  2#     Hip Extension  AROM;Right;Left;5 reps    Gait Training  weight shifting 4 ways without and without UE hold    Other Standing Knee Exercises  side stepping with and without UE support    Other Standing Knee Exercises  step taps 10x right/left alternating      Knee/Hip Exercises: Seated   Long Arc Quad  Strengthening;Both;1 set;10 reps;Weights    Long Arc [employer]  2 lbs.    Clamshell with TheraBand  Blue 2x10 single and double leg    Other Seated Knee/Hip Exercises  2# plyo ball chops 10x each way    Other Seated Knee/Hip Exercises  knee extension with band figure 8 10x each    Marching  Strengthening;Right;Left;10 reps    Marching Limitations  red band    Sit to [place]  1 set;10 reps;without UE support high mat table             PT Education - 06/06/17 1558    Education provided  Yes    Education Details  seated red band knee extensions and hip flexion   [id number]     Person(s) Educated  Patient;Caregiver(s)    Methods  Explanation;Demonstration;Handout    Comprehension  Returned demonstration;Verbalized understanding       PT Short Term Goals - 05/23/17 1730      PT SHORT TERM GOAL #1   Title  The patient will be able to ambulate 500 feet with walker needed for increased community ambulation for grocery shopping, return to work, travel    Time  4    Period  Weeks    Status  New    Target Date  06/20/17      PT SHORT TERM GOAL #2   Title  The patient will have improved gait speed with timed up and  Go test to 14 sec     Time  4    Period  Weeks    Status  New      PT SHORT TERM GOAL #3   Title  BERG Balance test score improved to 45/56 indicating improved balance and stability     Time  4    Period  Weeks    Status  New      PT SHORT TERM GOAL #4   Title  The patient will have improved LE strength to grossly 4/5 needed for 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    Time  4    Period  Weeks    Status  New        PT Long Term Goals - 05/23/17 1852      PT LONG TERM GOAL #1   Title  The patient will independent in safe self progression of HEP    Time  8    Period  Weeks    Status  New    Target Date  07/18/17      PT LONG TERM GOAL #2   Title  The patient will be able to ambulate with a SPC short to medium community distances 500 feet     Time  8    Period  Weeks    Status  New      PT LONG TERM GOAL #3   Title  The patient will have LE strength to grossly 4+/5 needed to rise from a standard chair with minimal UE use     Time  8    Period  Weeks    Status  New      PT LONG TERM GOAL #4   Title  BERG balance score improved to 47/56 indicating improved balance and decreased risk for falls    Time  8    Period  Weeks    Status  New      PT LONG TERM GOAL #5   Title  Timed up and Go  gait speed improved to 13 sec indicating improved  strength     Time  8    Period  Days    Status  New            Plan - 06/06/17 1602    Clinical Impression Statement  The patient presents with his SPC today.  He is able to perform 2-3 standing exercises with mild shortness of breath but normal breathing within 10-15 sec of sitting.   He is able to increase number of exercises, repetition and resistance from last visit.  Therapist providing close supervision for safety and to monitor response.      Rehab Potential  Good    Clinical Impairments Affecting Rehab Potential  see numerous co-morbidities above    PT  Frequency  2x / week    PT Duration  8 weeks    PT Treatment/Interventions  ADLs/Self Care Home Management;Electrical Stimulation;Ultrasound;Moist Heat;Cryotherapy;Therapeutic exercise;Balance training;Therapeutic activities;Functional mobility training;Gait training;Stair training;Neuromuscular re-education;Patient/family education;Manual techniques;Taping    PT Next Visit Plan  see how appt with Dr. Naaman Plummer went;  give aquatic HEP for upcoming vacation;  continue LE strengthening       Patient will benefit from skilled therapeutic intervention in order to improve the following deficits and impairments:  Pain, Difficulty walking, Decreased balance, Decreased strength, Decreased activity tolerance, Decreased knowledge of use of DME  Visit Diagnosis: Muscle weakness (generalized)  Difficulty in walking, not elsewhere classified     Problem List Patient Active Problem List   Diagnosis Date Noted  . Chronic anticoagulation 05/22/2017  . Cardiomyopathy ([place]) 05/22/2017  . Hypotension 05/22/2017  . RBBB 05/22/2017  . UTI due to extended-spectrum beta lactamase (ESBL) producing Escherichia coli 04/06/2017  . Debility   . Wound infection   . OSA (obstructive sleep apnea)   . Acute systolic heart failure ([place])   . Morbid obesity ([place])   . Diabetes mellitus, new onset ([place])   . Dysuria   . Acute respiratory distress syndrome (ARDS) (HCC)   . Influenza A with pneumonia   . New onset atrial fibrillation ([place])   . Anxiety   . Current moderate episode of major depressive disorder without prior episode ([place])   . Obesity, Class III, BMI 40-49.9 (morbid obesity) ([place])   . Community acquired pneumonia   . Critical illness myopathy   . Dysphagia   . Non-Hodgkin's lymphoma ([place])   . Benign essential HTN   . Leukocytosis   . Hypokalemia   . Tracheostomy status ([place])   . Hypoxic   . Pressure injury of skin 02/28/2017  . Status post tracheostomy ([place])   . Follicular lymphoma grade II of  intrathoracic lymph nodes ([place]) 05/02/2016  . Portacath in place 05/04/2015  . Gout attack 01/16/2012  . Follicular lymphoma grade II ([place]) 12/16/2010   Ruben Im, PT 06/06/17 4:14 PM Phone: [phone] Fax: [phone]  Alvera Singh 06/06/2017, 4:14 PM  [facility] [address], [place], [place], [address] Phone: [phone]   Fax:  [phone]  Name: [name] MRN: [id number] Date of Birth: [birth year]

## 2017-06-06 NOTE — Telephone Encounter (Signed)
Pt wants to know if he can have Dr. Percival Spanish start filling his Eliquis 5mg  90 days at [facility] [phone]

## 2017-06-06 NOTE — Patient Instructions (Signed)
    Access Code: [address]  URL: [email or web address]  Date: 06/06/2017  Prepared by: Ruben Im   Exercises  Seated Knee Extension with Resistance - 10 reps - 1 sets - 1x daily - 7x weekly  Seated Knee Lifts with Resistance - 10 reps - 1 sets - 1x daily - 7x weekly    Ruben Im PT [facility] [address] [place], [place] [address] Phone # [phone] Fax [phone]

## 2017-06-06 NOTE — Telephone Encounter (Signed)
Rx has been sent to the pharmacy electronically. ° °

## 2017-06-07 ENCOUNTER — Encounter: Payer: BLUE CROSS/BLUE SHIELD | Admitting: Physical Medicine & Rehabilitation

## 2017-06-07 ENCOUNTER — Encounter: Payer: Self-pay | Admitting: Physical Medicine & Rehabilitation

## 2017-06-07 VITALS — BP 118/86 | HR 83 | Ht 72.0 in | Wt 303.0 lb

## 2017-06-07 DIAGNOSIS — G7281 Critical illness myopathy: Secondary | ICD-10-CM

## 2017-06-07 DIAGNOSIS — F419 Anxiety disorder, unspecified: Secondary | ICD-10-CM | POA: Diagnosis not present

## 2017-06-07 DIAGNOSIS — Z87891 Personal history of nicotine dependence: Secondary | ICD-10-CM | POA: Diagnosis not present

## 2017-06-07 DIAGNOSIS — E785 Hyperlipidemia, unspecified: Secondary | ICD-10-CM | POA: Diagnosis not present

## 2017-06-07 DIAGNOSIS — E119 Type 2 diabetes mellitus without complications: Secondary | ICD-10-CM | POA: Diagnosis not present

## 2017-06-07 DIAGNOSIS — R5381 Other malaise: Secondary | ICD-10-CM

## 2017-06-07 DIAGNOSIS — R300 Dysuria: Secondary | ICD-10-CM | POA: Diagnosis not present

## 2017-06-07 DIAGNOSIS — I110 Hypertensive heart disease with heart failure: Secondary | ICD-10-CM | POA: Diagnosis not present

## 2017-06-07 DIAGNOSIS — R531 Weakness: Secondary | ICD-10-CM | POA: Diagnosis not present

## 2017-06-07 DIAGNOSIS — Z9889 Other specified postprocedural states: Secondary | ICD-10-CM | POA: Diagnosis not present

## 2017-06-07 DIAGNOSIS — C8290 Follicular lymphoma, unspecified, unspecified site: Secondary | ICD-10-CM | POA: Diagnosis not present

## 2017-06-07 DIAGNOSIS — I5021 Acute systolic (congestive) heart failure: Secondary | ICD-10-CM | POA: Diagnosis not present

## 2017-06-07 NOTE — Patient Instructions (Signed)
PLEASE FEEL FREE TO CALL OUR OFFICE WITH ANY PROBLEMS OR QUESTIONS ([phone])      

## 2017-06-07 NOTE — Progress Notes (Signed)
Subjective:    Patient ID: [name], male    DOB: 04/17/1955, 62 y.o.   MRN: [id number]  HPI   Mr. Taul is here in follow up of his gait disorder.  I saw him earlier this month.  We transition from home to outpatient therapies.  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  He has been using rest breaks in stool to help get through days.  He is going to therapy once a day per week at present.  Thi patient using a cane today.  Use the cane only for Ngs have been going well so far although he is quite fatigued typically by the end of the day.  Pain is minimal at this point.  .  Pain Inventory Average Pain 5 Pain Right Now 5 My pain is intermittent and dull  In the last 24 hours, has pain interfered with the following? General activity 0 Relation with others 0 Enjoyment of life 0 What TIME of day is your pain at its worst? morning Sleep (in general) Good  Pain is worse with: walking and standing Pain improves with: rest, pacing activities and medication Relief from Meds: 6  Mobility walk with assistance use a cane use a walker ability to climb steps?  yes do you drive?  yes  Function employed # of hrs/week .  Neuro/Psych trouble walking  Prior Studies Any changes since last visit?  no  Physicians involved in your care Any changes since last visit?  no   Family History  Problem Relation Age of Onset  . Colon cancer Neg Hx   . Pancreatic cancer Neg Hx   . Stomach cancer Neg Hx   . Rectal cancer Neg Hx    Social History   Socioeconomic History  . Marital status: Divorced    Spouse name: Not on file  . Number of children: Not on file  . Years of education: Not on file  . Highest education level: Not on file  Occupational History  . Not on file  Social Needs  . Financial resource strain: Not on file  . Food insecurity:    Worry: Not on file    Inability: Not on file  . Transportation needs:    Medical:  Not on file    Non-medical: Not on file  Tobacco Use  . Smoking status: Former Smoker    Packs/day: 0.50    Years: 20.00    Pack years: 10.00    Last attempt to quit: 01/10/2009    Years since quitting: 8.4  . Smokeless tobacco: Never Used  Substance and Sexual Activity  . Alcohol use: No  . Drug use: No  . Sexual activity: Not on file  Lifestyle  . Physical activity:    Days per week: Not on file    Minutes per session: Not on file  . Stress: Not on file  Relationships  . Social connections:    Talks on phone: Not on file    Gets together: Not on file    Attends religious service: Not on file    Active member of club or organization: Not on file    Attends meetings of clubs or organizations: Not on file    Relationship status: Not on file  Other Topics Concern  . Not on file  Social History Narrative  . Not on file   Past Surgical History:  Procedure Laterality Date  .  BRONCHOSCOPY    . ESOPHAGOGASTRODUODENOSCOPY ENDOSCOPY     Dr Jearld Fenton  . INGUINAL HERNIA REPAIR     2001  . PORTA CATH INSERTION  2011   Past Medical History:  Diagnosis Date  . Acute systolic HF (heart failure) ([place])   . Atrial fibrillation ([place])   . Diabetes mellitus, new onset ([place])   . Dyslipidemia   . Gout attack 01/16/2012  . Hypertension   . nhl dx'd 01/2010  . Venous insufficiency   . Venous stasis ulcers (HCC)    There were no vitals taken for this visit.  Opioid Risk Score:   Fall Risk Score:  `1  Depression screen PHQ 2/9  No flowsheet data found.   Review of Systems  Constitutional: Negative.   HENT: Negative.   Eyes: Negative.   Respiratory: Negative.   Cardiovascular: Negative.   Gastrointestinal: Negative.   Endocrine: Negative.   Genitourinary: Negative.   Musculoskeletal: Positive for arthralgias, gait problem and myalgias.  Skin: Negative.   Allergic/Immunologic: Negative.   Hematological: Negative.   Psychiatric/Behavioral: Negative.   All other systems reviewed  and are negative.      Objective:   Physical Exam  General: No acute distress HEENT: EOMI, oral membranes moist Cards: reg rate  Chest: normal effort Abdomen: Soft, NT, ND Skin: dry, intact Extremities: no edema  Neurological: He is alert and oriented to person, place, and time. No cranial nerve deficit. Reasonable insight and awareness Motor: UE:5/5 prox to distal.normal sense.    He transfers easily today.  He is using a cane for balance.  Weight shift is good and pelvis appears balance during stance and gait.   Musc: Minimal musculoskeletal pain today.  1+ edema left lower extremity.  He is wearing compression stockings. Skin: Skin is intact Psychiatric: Pleasant and appropriate as always      Assessment & Plan:  1. Generalized weakness, limitation in self-care secondary to debility. --continue with outpt PT    -working currently.  -I have nothing new to offer at this point.  -I did caution him not to overdo things from my work standpoint. 2. New onset A fib/DVT Prophylaxis/Anticoagulation:  Eliquis -cards f/u 3. Pain Management: tylenol prn  -continued 4. Anxiety disorder/Mood: Continue Zoloft .  5. Neuropsych: This patient is capable of making decisions on his own behalf. 6. Skin/Wound Care:--keep skin clean and dry.  7. OSA?Acute hypoxic BP:[facility] sleep apnea, management per pulmonary 9. Acute systolic CHF: per cardiology  -Continuecozaar,Coreg bid, Lasix bid, Lipitor and baby ASA. 10.BorderlineT2DM: Diet controlled 13. Follicular lymphoma: In remission.  14. Chronic BLE edemaresolved--continue with Jobst stockings  15 minutes of face to face patient care time were spent during this visit. All questions were encouraged and answered.   I completed paperwork for patient as well today.  I will see the patient back as needed in the future.

## 2017-06-07 NOTE — Telephone Encounter (Signed)
Pt.notified

## 2017-06-08 ENCOUNTER — Ambulatory Visit: Payer: BLUE CROSS/BLUE SHIELD | Admitting: Physical Therapy

## 2017-06-08 ENCOUNTER — Encounter: Payer: Self-pay | Admitting: Physical Therapy

## 2017-06-08 DIAGNOSIS — M6281 Muscle weakness (generalized): Secondary | ICD-10-CM

## 2017-06-08 DIAGNOSIS — R262 Difficulty in walking, not elsewhere classified: Secondary | ICD-10-CM | POA: Diagnosis not present

## 2017-06-08 DIAGNOSIS — R5381 Other malaise: Secondary | ICD-10-CM | POA: Diagnosis not present

## 2017-06-08 DIAGNOSIS — J9602 Acute respiratory failure with hypercapnia: Secondary | ICD-10-CM | POA: Diagnosis not present

## 2017-06-08 DIAGNOSIS — J9601 Acute respiratory failure with hypoxia: Secondary | ICD-10-CM | POA: Diagnosis not present

## 2017-06-08 DIAGNOSIS — Z930 Tracheostomy status: Secondary | ICD-10-CM | POA: Diagnosis not present

## 2017-06-08 DIAGNOSIS — C8210 Follicular lymphoma grade II, unspecified site: Secondary | ICD-10-CM | POA: Diagnosis not present

## 2017-06-08 NOTE — Patient Instructions (Signed)
   Access Code: [email or web address]  URL: [email or web address]  Date: 06/08/2017  Prepared by: Ruben Im   Exercises  . Standing Hip Abduction Adduction at Pool Wall - 10 reps - 3 sets - 1x daily - 7x weekly  . Side Stepping - 10 reps - 3 sets - 1x daily - 7x weekly  . Squat - 10 reps - 3 sets - 1x daily - 7x weekly  . Standing Hip Flexion Extension at [employer] - 10 reps - 3 sets - 1x daily - 7x weekly  . Standing March at [place] 10 reps - 3 sets - 1x daily - 7x weekly  . Standing Hip Circles at [employer] - 10 reps - 3 sets - 1x daily - 7x weekly  . Heel Toe Raises at [facility] - 10 reps - 3 sets - 1x daily - 7x weekly  . Push-Up on Pool Wall - 10 reps - 3 sets - 1x daily - 7x weekly  . Piriformis Stretch at [employer] - 10 reps - 3 sets - 1x daily - 7x weekly  . Standing Trunk Rotation with Overhead Arm Raise with [place] - 10 reps - 3 sets - 1x daily - 7x weekly  . Forward March - 10 reps - 3 sets - 1x daily - 7x weekly  . Heel Toe Raises at [facility] - 10 reps - 3 sets - 1x daily - 7x weekly  . Lateral Stepping at [place] 10 reps - 3 sets - 1x daily - 7x weekly      Ruben Im PT [facility] [address], [place], [place] [address] Phone # [phone] Fax [phone]

## 2017-06-08 NOTE — Therapy (Signed)
[facility] [address], [place], [place], [address] Phone: [phone]   Fax:  [phone]  Physical Therapy Treatment  Patient Details  Name: [name] MRN: [id number] Date of Birth: 01-Mar-1955 Referring Provider: Dr Naaman Plummer   Encounter Date: 06/08/2017  PT End of Session - 06/08/17 1946    Visit Number  4    Number of Visits  12    Date for PT Re-Evaluation  07/18/17    Authorization Type  30 visit limit;  had 12 visits HHPT, 6 visits HHOT = 18 visits;  leaves 12 visits for outpatient PT     Authorization - Number of Visits  12    PT Start Time  1451    PT Stop Time  1530    PT Time Calculation (min)  39 min    Activity Tolerance  Patient tolerated treatment well       Past Medical History:  Diagnosis Date  . Acute systolic HF (heart failure) ([place])   . Atrial fibrillation ([place])   . Diabetes mellitus, new onset ([place])   . Dyslipidemia   . Gout attack 01/16/2012  . Hypertension   . nhl dx'd 01/2010  . Venous insufficiency   . Venous stasis ulcers (HCC)     Past Surgical History:  Procedure Laterality Date  . BRONCHOSCOPY    . ESOPHAGOGASTRODUODENOSCOPY ENDOSCOPY     Dr Jearld Fenton  . INGUINAL HERNIA REPAIR     2001  . PORTA CATH INSERTION  2011    There were no vitals filed for this visit.  Subjective Assessment - 06/08/17 1452    Subjective  Patient needs seated rest break upon arrival to PT.  Had a good appt with Dr. Naaman Plummer yesterday. He was pleased with progress and "he released me."  Comes without caregiver today.  Driving himself.  Denies soreness after last visit.      Limitations  Walking;House hold activities;Standing    Patient Stated Goals  walk with the cane full time or nothing at all; get breathing better with activity     Currently in Pain?  Yes    Pain Score  5     Pain Location  Ankle    Pain Type  Chronic pain    Pain Frequency  Constant    Aggravating Factors   hurts all the time                        [facility] Adult PT Treatment/Exercise - 06/08/17 0001      Lumbar Exercises: Seated   Other Seated Lumbar Exercises  green band rows and extensions shoulder 10x each    Other Seated Lumbar Exercises  review of pool ex's per handout      Knee/Hip Exercises: Aerobic   Nustep  Seat 13, arms 11, 8 min L2 while discussing progress with PT      Knee/Hip Exercises: Standing   Heel Raises  Both;1 set;10 reps    Forward Step Up  Right;Left;1 set;5 reps    SLS with Vectors  3 ways with bil UE support 5x each leg    Gait Training  weight shifting 4 ways without and without UE hold    Other Standing Knee Exercises  step taps 10x right/left alternating      Knee/Hip Exercises: Seated   Long Arc Quad  Right;Left;15 reps;Weights    Long Arc Quad Weight  3 lbs.    Clamshell with TheraBand  Blue 3x10 single and double leg    Sit to [employer]  2 sets;5 reps chair plus black cushion             PT Education - 06/08/17 1946    Education provided  Yes    Education Details  pool exercises    Person(s) Educated  Patient    Methods  Explanation;Handout    Comprehension  Verbalized understanding       PT Short Term Goals - 05/23/17 1730      PT SHORT TERM GOAL #1   Title  The patient will be able to ambulate 500 feet with walker needed for increased community ambulation for grocery shopping, return to work, travel    Time  4    Period  Weeks    Status  New    Target Date  06/20/17      PT SHORT TERM GOAL #2   Title  The patient will have improved gait speed with timed up and Go test to 14 sec     Time  4    Period  Weeks    Status  New      PT SHORT TERM GOAL #3   Title  BERG Balance test score improved to 45/56 indicating improved balance and stability     Time  4    Period  Weeks    Status  New      PT SHORT TERM GOAL #4   Title  The patient will have improved LE strength to grossly 4/5 needed for 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  and standing at work for longer periods of time    Time  4    Period  Weeks    Status  New        PT Long Term Goals - 05/23/17 1852      PT LONG TERM GOAL #1   Title  The patient will independent in safe self progression of HEP    Time  8    Period  Weeks    Status  New    Target Date  07/18/17      PT LONG TERM GOAL #2   Title  The patient will be able to ambulate with a SPC short to medium community distances 500 feet     Time  8    Period  Weeks    Status  New      PT LONG TERM GOAL #3   Title  The patient will have LE strength to grossly 4+/5 needed to rise from a standard chair with minimal UE use     Time  8    Period  Weeks    Status  New      PT LONG TERM GOAL #4   Title  BERG balance score improved to 47/56 indicating improved balance and decreased risk for falls    Time  8    Period  Weeks    Status  New      PT LONG TERM GOAL #5   Title  Timed up and Go gait speed improved to 13 sec indicating improved  strength     Time  8    Period  Days    Status  New            Plan - 06/08/17 1947    Clinical Impression Statement  The patient presents with his [place].  He is excited about his trip to the beach  next week and requests exercises he can do in the pool.  He is able to perform a progression of strengthening ex's with mild shortness of breath.  His breathing normalizes quickly with rest.  He is able to rise from the chair with greater ease.  Therapist closely monitoring response with all.      Rehab Potential  Good    Clinical Impairments Affecting Rehab Potential  see numerous co-morbidities above    PT Frequency  2x / week    PT Duration  8 weeks    PT Treatment/Interventions  ADLs/Self Care Home Management;Electrical Stimulation;Ultrasound;Moist Heat;Cryotherapy;Therapeutic exercise;Balance training;Therapeutic activities;Functional mobility training;Gait training;Stair training;Neuromuscular re-education;Patient/family education;Manual  techniques;Taping    PT Next Visit Plan  follow up in 1 week after his vacation;  continue LE strengthening; balance, functional reactivation, endurance       Patient will benefit from skilled therapeutic intervention in order to improve the following deficits and impairments:  Pain, Difficulty walking, Decreased balance, Decreased strength, Decreased activity tolerance, Decreased knowledge of use of DME  Visit Diagnosis: Muscle weakness (generalized)  Difficulty in walking, not elsewhere classified     Problem List Patient Active Problem List   Diagnosis Date Noted  . Chronic anticoagulation 05/22/2017  . Cardiomyopathy ([place]) 05/22/2017  . Hypotension 05/22/2017  . RBBB 05/22/2017  . UTI due to extended-spectrum beta lactamase (ESBL) producing Escherichia coli 04/06/2017  . Debility   . Wound infection   . OSA (obstructive sleep apnea)   . Acute systolic heart failure ([place])   . Morbid obesity ([place])   . Diabetes mellitus, new onset ([place])   . Dysuria   . Acute respiratory distress syndrome (ARDS) (HCC)   . Influenza A with pneumonia   . New onset atrial fibrillation ([place])   . Anxiety   . Current moderate episode of major depressive disorder without prior episode ([place])   . Obesity, Class III, BMI 40-49.9 (morbid obesity) ([place])   . Community acquired pneumonia   . Critical illness myopathy   . Dysphagia   . Non-Hodgkin's lymphoma ([place])   . Benign essential HTN   . Leukocytosis   . Hypokalemia   . Tracheostomy status ([place])   . Hypoxic   . Pressure injury of skin 02/28/2017  . Status post tracheostomy ([place])   . Follicular lymphoma grade II of intrathoracic lymph nodes ([place]) 05/02/2016  . Portacath in place 05/04/2015  . Gout attack 01/16/2012  . Follicular lymphoma grade II ([place]) 12/16/2010   Ruben Im, PT 06/08/17 7:54 PM Phone: [phone] Fax: [phone]  Alvera Singh 06/08/2017, 7:51 PM  [facility] [address] [place], [place], [address] Phone: [phone]   Fax:  [phone]  Name: HENCE DERRICK MRN: [id number] Date of Birth: 1955/12/05

## 2017-06-19 ENCOUNTER — Encounter: Payer: Self-pay | Admitting: Emergency Medicine

## 2017-06-19 ENCOUNTER — Ambulatory Visit: Payer: BLUE CROSS/BLUE SHIELD | Admitting: Emergency Medicine

## 2017-06-19 VITALS — BP 102/80 | HR 103 | Ht 70.0 in | Wt 313.0 lb

## 2017-06-19 DIAGNOSIS — R0609 Other forms of dyspnea: Secondary | ICD-10-CM | POA: Diagnosis not present

## 2017-06-19 DIAGNOSIS — G4733 Obstructive sleep apnea (adult) (pediatric): Secondary | ICD-10-CM

## 2017-06-19 DIAGNOSIS — R0600 Dyspnea, unspecified: Secondary | ICD-10-CM | POA: Insufficient documentation

## 2017-06-19 DIAGNOSIS — J80 Acute respiratory distress syndrome: Secondary | ICD-10-CM

## 2017-06-19 DIAGNOSIS — J189 Pneumonia, unspecified organism: Secondary | ICD-10-CM

## 2017-06-19 DIAGNOSIS — J181 Lobar pneumonia, unspecified organism: Principal | ICD-10-CM

## 2017-06-19 LAB — PULMONARY FUNCTION TEST
DL/VA % pred: 74 %
DL/VA: 3.43 ml/min/mmHg/L
DLCO cor % pred: 56 %
DLCO cor: 18.16 ml/min/mmHg
DLCO unc % pred: 52 %
DLCO unc: 16.92 ml/min/mmHg
FEF 25-75 POST: 4.54 L/s
FEF 25-75 Pre: 4.18 L/sec
FEF2575-%Change-Post: 8 %
FEF2575-%Pred-Post: 157 %
FEF2575-%Pred-Pre: 145 %
FEV1-%Change-Post: 1 %
FEV1-%PRED-POST: 94 %
FEV1-%PRED-PRE: 93 %
FEV1-POST: 3.35 L
FEV1-PRE: 3.32 L
FEV1FVC-%CHANGE-POST: 0 %
FEV1FVC-%Pred-Pre: 114 %
FEV6-%CHANGE-POST: 0 %
FEV6-%PRED-PRE: 86 %
FEV6-%Pred-Post: 86 %
FEV6-POST: 3.9 L
FEV6-PRE: 3.87 L
FEV6FVC-%PRED-PRE: 105 %
FEV6FVC-%Pred-Post: 105 %
FVC-%CHANGE-POST: 0 %
FVC-%PRED-POST: 82 %
FVC-%PRED-PRE: 82 %
FVC-POST: 3.9 L
FVC-PRE: 3.87 L
POST FEV6/FVC RATIO: 100 %
PRE FEV1/FVC RATIO: 86 %
Post FEV1/FVC ratio: 86 %
Pre FEV6/FVC Ratio: 100 %
RV % PRED: 49 %
RV: 1.14 L
TLC % PRED: 73 %
TLC: 5.13 L

## 2017-06-19 NOTE — Assessment & Plan Note (Signed)
Suspect this is multifactorial including contribution of his obesity, underlying heart disease, deconditioning from his hospitalization.  Given his smoking history it is also possible that there is a component of obstruction although this is not really seen clearly on his pulmonary function testing.  He does have restricted volumes but normal spirometry that could be consistent with occult obstruction.  I will start scheduled bronchodilators right now.  We will keep albuterol available as needed.  May need to reconsider at a later time.

## 2017-06-19 NOTE — Assessment & Plan Note (Signed)
His sleep study shows an AHI of 8/h, mild sleep apnea but it was accentuated when he was in REM sleep.  I think that it would benefit him to start CPAP especially since were trying to manage and control atrial fibrillation, CHF.  We will order today, best fit mask.  Follow-up in 2 months to assess compliance and ability to tolerate.

## 2017-06-19 NOTE — Progress Notes (Signed)
Subjective:    Patient ID: [name], male    DOB: Jun 11, 1955, 62 y.o.   MRN: [id number]   62 year old man with a history of former tobacco use (30 pk-yrs), known lymphoma that is in remission.  He was admitted to the hospital in March 2019 with ARDS following influenza and newly diagnosed systolic CHF with an EF of approximately 25% and diffuse hypokinesis.  He had a tracheostomy was decannulated March 13.  A sleep study was done on 04/20/2017.  This showed mild obstructive sleep apnea with an AHI of 8/h but with a high AHI during REM sleep. He has albuterol available to use prn, uses it for cough, seems to help him. He has exertional SOB, even w walking. Some improvement since the hospital, but still noticeable. Some cough, no real wheeze but may have some UA noise.  PFT today reviewed by me, show restricted lung volumes but grossly normal spirometry with some evidence for possible restriction based on the FEV1 to FVC ratio.  Suspect there could be some occult obstruction but it would be mild.  His diffusion capacity is normal.   Review of Systems  Past Medical History:  Diagnosis Date  . Acute systolic HF (heart failure) ([place])   . Atrial fibrillation ([place])   . Diabetes mellitus, new onset ([place])   . Dyslipidemia   . Gout attack 01/16/2012  . Hypertension   . nhl dx'd 01/2010  . Venous insufficiency   . Venous stasis ulcers ([facility])      Family History  Problem Relation Age of Onset  . Colon cancer Neg Hx   . Pancreatic cancer Neg Hx   . Stomach cancer Neg Hx   . Rectal cancer Neg Hx      Social History   Socioeconomic History  . Marital status: Divorced    Spouse name: Not on file  . Number of children: Not on file  . Years of education: Not on file  . Highest education level: Not on file  Occupational History  . Not on file  Social Needs  . Financial resource strain: Not on file  . Food insecurity:    Worry: Not on file    Inability: Not on file  . Transportation  needs:    Medical: Not on file    Non-medical: Not on file  Tobacco Use  . Smoking status: Former Smoker    Packs/day: 0.50    Years: 20.00    Pack years: 10.00    Last attempt to quit: 01/10/2009    Years since quitting: 8.4  . Smokeless tobacco: Never Used  Substance and Sexual Activity  . Alcohol use: No  . Drug use: No  . Sexual activity: Not on file  Lifestyle  . Physical activity:    Days per week: Not on file    Minutes per session: Not on file  . Stress: Not on file  Relationships  . Social connections:    Talks on phone: Not on file    Gets together: Not on file    Attends religious service: Not on file    Active member of club or organization: Not on file    Attends meetings of clubs or organizations: Not on file    Relationship status: Not on file  . Intimate partner violence:    Fear of current or ex partner: Not on file    Emotionally abused: Not on file    Physically abused: Not on file    Forced  sexual activity: Not on file  Other Topics Concern  . Not on file  Social History Narrative  . Not on file     Allergies  Allergen Reactions  . Latex Hives and Swelling  . Penicillins Swelling    Age 62 or 24 - pt states he had swelling all over and his lips got real big     Outpatient Medications Prior to Visit  Medication Sig Dispense Refill  . albuterol (PROVENTIL HFA;VENTOLIN HFA) 108 (90 Base) MCG/ACT inhaler Inhale 2 puffs into the lungs every 6 (six) hours as needed for wheezing or shortness of breath. 1 Inhaler 2  . apixaban (ELIQUIS) 5 MG TABS tablet Take 1 tablet (5 mg total) by mouth 2 (two) times daily. 180 tablet 3  . atorvastatin (LIPITOR) 20 MG tablet TAKE 1 TABLET BY MOUTH EVERY DAY 30 tablet 2  . carvedilol (COREG) 25 MG tablet TAKE 1 TABLET (25 MG TOTAL) BY MOUTH 2 (TWO) TIMES DAILY WITH A MEAL. 60 tablet 2  . febuxostat (ULORIC) 40 MG tablet Take 1 tablet (40 mg total) by mouth daily. 30 tablet 0  . furosemide (LASIX) 40 MG tablet TAKE 1 AND  1/2 TABLETS BY MOUTH 2 TIMES DAILY 90 tablet 2  . losartan (COZAAR) 25 MG tablet TAKE 1 TABLET BY MOUTH EVERY DAY 30 tablet 2  . Melatonin 3 MG TABS Take 2 tablets (6 mg total) by mouth at bedtime. 60 tablet 0  . Multiple Vitamin (MULTIVITAMIN WITH MINERALS) TABS tablet Take 1 tablet by mouth daily.    . pantoprazole (PROTONIX) 40 MG tablet TAKE 1 TABLET BY MOUTH EVERY DAY 30 tablet 2  . potassium chloride SA (KLOR-CON M20) 20 MEQ tablet Take 2 tablets (40 mEq total) by mouth 2 (two) times daily. 180 tablet 3  . Probiotic Product (PROBIOTIC DAILY PO) Take by mouth.    . sertraline (ZOLOFT) 50 MG tablet Take 1 tablet (50 mg total) by mouth daily. 30 tablet 0  . sodium chloride (OCEAN) 0.65 % SOLN nasal spray Place 2 sprays into both nostrils as needed for congestion.  0  . TURMERIC PO Take by mouth.    . vitamin C (VITAMIN C) 500 MG tablet Take 1 tablet (500 mg total) by mouth 3 (three) times daily.     Facility-Administered Medications Prior to Visit  Medication Dose Route Frequency Provider Last Rate Last Dose  . sodium chloride 0.9 % injection 10 mL  10 mL Intravenous PRN Curt Bears, MD   10 mL at 05/04/15 1529        Objective:   Physical Exam Vitals:   06/19/17 1559  BP: 102/80  Pulse: (!) 103  SpO2: 98%  Weight: (!) 313 lb (142 kg)  Height: 5\' 10"  (1.778 m)   Gen: Pleasant, obese man, in no distress,  normal affect  ENT: No lesions,  mouth clear,  oropharynx clear, no postnasal drip  Neck: No JVD, no stridor, old well healed stoma scar  Lungs: No use of accessory muscles, clear without rales or rhonchi  Cardiovascular: RRR, heart sounds normal, no murmur or gallops, no peripheral edema  Musculoskeletal: No deformities, no cyanosis or clubbing  Neuro: alert, non focal  Skin: Warm, no lesions or rash      Assessment & Plan:  OSA (obstructive sleep apnea) His sleep study shows an AHI of 8/h, mild sleep apnea but it was accentuated when he was in REM sleep.  I  think that it would benefit him to start  CPAP especially since were trying to manage and control atrial fibrillation, CHF.  We will order today, best fit mask.  Follow-up in 2 months to assess compliance and ability to tolerate.  Dyspnea on exertion Suspect this is multifactorial including contribution of his obesity, underlying heart disease, deconditioning from his hospitalization.  Given his smoking history it is also possible that there is a component of obstruction although this is not really seen clearly on his pulmonary function testing.  He does have restricted volumes but normal spirometry that could be consistent with occult obstruction.  I will start scheduled bronchodilators right now.  We will keep albuterol available as needed.  May need to reconsider at a later time.  [name] Apo, MD, PhD 06/19/2017, 4:42 PM [facility] [phone] or if no answer [phone]

## 2017-06-19 NOTE — Patient Instructions (Signed)
We will set up CPAP for you to use every night while sleeping.  Best fit mask. Keep your albuterol available to use 2 puffs if needed for shortness of breath, wheezing, chest tightness.  Depending on our evaluation as we go forward we will decide whether you might benefit at some point from an every day inhaled medication. Follow-up with cardiology as planned. Follow with Dr Lamonte Sakai in 2 months or sooner if you have any problems.

## 2017-06-20 ENCOUNTER — Ambulatory Visit: Payer: BLUE CROSS/BLUE SHIELD | Attending: Physical Medicine & Rehabilitation | Admitting: Physical Therapy

## 2017-06-20 ENCOUNTER — Encounter: Payer: Self-pay | Admitting: Physical Therapy

## 2017-06-20 DIAGNOSIS — Z930 Tracheostomy status: Secondary | ICD-10-CM | POA: Insufficient documentation

## 2017-06-20 DIAGNOSIS — R5381 Other malaise: Secondary | ICD-10-CM | POA: Diagnosis present

## 2017-06-20 DIAGNOSIS — R262 Difficulty in walking, not elsewhere classified: Secondary | ICD-10-CM | POA: Diagnosis present

## 2017-06-20 DIAGNOSIS — J9601 Acute respiratory failure with hypoxia: Secondary | ICD-10-CM | POA: Diagnosis present

## 2017-06-20 DIAGNOSIS — C8210 Follicular lymphoma grade II, unspecified site: Secondary | ICD-10-CM | POA: Diagnosis not present

## 2017-06-20 DIAGNOSIS — J9602 Acute respiratory failure with hypercapnia: Secondary | ICD-10-CM | POA: Diagnosis present

## 2017-06-20 DIAGNOSIS — M6281 Muscle weakness (generalized): Secondary | ICD-10-CM | POA: Diagnosis not present

## 2017-06-20 NOTE — Therapy (Signed)
[facility] [address], [place], [place], [address] Phone: [phone]   Fax:  [phone]  Physical Therapy Treatment  Patient Details  Name: [name] MRN: [id number] Date of Birth: 1955-09-26 Referring Provider: Dr Naaman Plummer   Encounter Date: 06/20/2017  PT End of Session - 06/20/17 1729    Visit Number  5    Number of Visits  12    Date for PT Re-Evaluation  07/18/17    Authorization Type  30 visit limit;  had 12 visits HHPT, 6 visits HHOT = 18 visits;  leaves 12 visits for outpatient PT     Authorization - Number of Visits  12    PT Start Time  1530    PT Stop Time  1615    PT Time Calculation (min)  45 min    Activity Tolerance  Patient tolerated treatment well       Past Medical History:  Diagnosis Date  . Acute systolic HF (heart failure) ([place])   . Atrial fibrillation ([place])   . Diabetes mellitus, new onset ([place])   . Dyslipidemia   . Gout attack 01/16/2012  . Hypertension   . nhl dx'd 01/2010  . Venous insufficiency   . Venous stasis ulcers (H[facility])     Past Surgical History:  Procedure Laterality Date  . BRONCHOSCOPY    . ESOPHAGOGASTRODUODENOSCOPY ENDOSCOPY     Dr Jearld Fenton  . INGUINAL HERNIA REPAIR     2001  . PORTA CATH INSERTION  2011    There were no vitals filed for this visit.  Subjective Assessment - 06/20/17 1543    Subjective  Had some lateral hip pain from riding in the car from the beach.  Did lots of pool ex.  Worked yesterday and today.  He states he got a "good report" from the lung doctor yesterday.      Currently in Pain?  Yes    Pain Score  5     Pain Location  Hip    Pain Orientation  Left                       [facility] Adult PT Treatment/Exercise - 06/20/17 0001      Lumbar Exercises: Seated   Other Seated Lumbar Exercises  red band rows and extensions shoulder 15x each    Other Seated Lumbar Exercises  push down on cane for transverse abdominus activation  10x      Knee/Hip Exercises: Aerobic   Nustep  Seat 13, arms 11, 8 min L2 while discussing progress with PT      Knee/Hip Exercises: Standing   Heel Raises  Both;1 set;10 reps    Hip Extension  Stengthening;Right;Left;10 reps    Extension Limitations  green band     Forward Step Up  Right;Left;1 set;5 reps    SLS with Vectors  3 ways with bil UE support 5x each leg    Other Standing Knee Exercises  step and reach 5x each side    Other Standing Knee Exercises  step taps 20x right/left alternating      Knee/Hip Exercises: Seated   Long Arc Quad  Right;Left;15 reps;Weights    Long Arc Quad Weight  3 lbs.    Marching  Strengthening;Right;Left;Weights    Marching Limitations  3    Sit to [employer]  2 sets;5 reps;without UE support high table      Shoulder Exercises: Seated   Extension  Strengthening;Both;20 reps;Theraband  Row  Strengthening;Both;10 reps;Theraband    Theraband Level (Shoulder Row)  Level 2 (Red)    Other Seated Exercises  3# shoulder to hip diagonals 10x each way; Vs 10x                PT Short Term Goals - 06/20/17 1735      PT SHORT TERM GOAL #1   Title  The patient will be able to ambulate 500 feet with walker needed for increased community ambulation for grocery shopping, return to work, travel    Status  Achieved      PT [place] #2   Title  The patient will have improved gait speed with timed up and Go test to 14 sec     Time  4    Period  Weeks    Status  On-going      PT SHORT TERM GOAL #3   Title  BERG Balance test score improved to 45/56 indicating improved balance and stability     Time  4    Period  Weeks    Status  On-going      PT SHORT TERM GOAL #4   Title  The patient will have improved LE strength to grossly 4/5 needed for 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    Time  4    Period  Weeks    Status  On-going        PT Long Term Goals - 05/23/17 1852      PT  LONG TERM GOAL #1   Title  The patient will independent in safe self progression of HEP    Time  8    Period  Weeks    Status  New    Target Date  07/18/17      PT LONG TERM GOAL #2   Title  The patient will be able to ambulate with a SPC short to medium community distances 500 feet     Time  8    Period  Weeks    Status  New      PT LONG TERM GOAL #3   Title  The patient will have LE strength to grossly 4+/5 needed to rise from a standard chair with minimal UE use     Time  8    Period  Weeks    Status  New      PT LONG TERM GOAL #4   Title  BERG balance score improved to 47/56 indicating improved balance and decreased risk for falls    Time  8    Period  Weeks    Status  New      PT LONG TERM GOAL #5   Title  Timed up and Go gait speed improved to 13 sec indicating improved  strength     Time  8    Period  Days    Status  New            Plan - 06/20/17 1729    Clinical Impression Statement  The patient is able to participate in a slight progression of standing exercises but needs 2 sitting rest breaks.  He continues to make steady functional improvements.  He is using his cane full time now including his vacation to the [employer].  Tenderness over hip trochanteric bursa today with some exercises requiring modification.   Therapist closely monitoring fatigue level, pain and for safety.  Rehab Potential  Good    Clinical Impairments Affecting Rehab Potential  see numerous co-morbidities above    PT Frequency  2x / week    PT Duration  8 weeks    PT Treatment/Interventions  ADLs/Self Care Home Management;Electrical Stimulation;Ultrasound;Moist Heat;Cryotherapy;Therapeutic exercise;Balance training;Therapeutic activities;Functional mobility training;Gait training;Stair training;Neuromuscular re-education;Patient/family education;Manual techniques;Taping    PT Next Visit Plan  recheck Timed up and Go;  BERG and MMT for STGs    Recommended Other Services  Discussed  [employer] following PT       Patient will benefit from skilled therapeutic intervention in order to improve the following deficits and impairments:  Pain, Difficulty walking, Decreased balance, Decreased strength, Decreased activity tolerance, Decreased knowledge of use of DME  Visit Diagnosis: Muscle weakness (generalized)  Difficulty in walking, not elsewhere classified     Problem List Patient Active Problem List   Diagnosis Date Noted  . Dyspnea on exertion 06/19/2017  . Chronic anticoagulation 05/22/2017  . Cardiomyopathy ([place]) 05/22/2017  . Hypotension 05/22/2017  . RBBB 05/22/2017  . UTI due to extended-spectrum beta lactamase (ESBL) producing Escherichia coli 04/06/2017  . Debility   . Wound infection   . OSA (obstructive sleep apnea)   . Acute systolic heart failure ([place])   . Morbid obesity ([place])   . Diabetes mellitus, new onset ([place])   . Dysuria   . Acute respiratory distress syndrome (ARDS) ([facility])   . Influenza A with pneumonia   . New onset atrial fibrillation ([place])   . Anxiety   . Current moderate episode of major depressive disorder without prior episode ([place])   . Obesity, Class III, BMI 40-49.9 (morbid obesity) ([place])   . Community acquired pneumonia   . Critical illness myopathy   . Dysphagia   . Non-Hodgkin's lymphoma ([place])   . Benign essential HTN   . Leukocytosis   . Hypokalemia   . Tracheostomy status ([place])   . Hypoxic   . Pressure injury of skin 02/28/2017  . Status post tracheostomy ([place])   . Follicular lymphoma grade II of intrathoracic lymph nodes ([place]) 05/02/2016  . Portacath in place 05/04/2015  . Gout attack 01/16/2012  . Follicular lymphoma grade II ([place]) 12/16/2010   Ruben Im, PT 06/20/17 5:37 PM Phone: [phone] Fax: [phone]  Alvera Singh 06/20/2017, 5:36 PM  [facility] [address] [place], [place], [address] Phone: [phone]   Fax:   [phone]  Name: [name] MRN: [id number] Date of Birth: [birth year]

## 2017-06-22 ENCOUNTER — Encounter: Payer: Self-pay | Admitting: Physical Therapy

## 2017-06-22 ENCOUNTER — Ambulatory Visit: Payer: BLUE CROSS/BLUE SHIELD | Admitting: Physical Therapy

## 2017-06-22 DIAGNOSIS — Z930 Tracheostomy status: Secondary | ICD-10-CM | POA: Diagnosis not present

## 2017-06-22 DIAGNOSIS — M6281 Muscle weakness (generalized): Secondary | ICD-10-CM | POA: Diagnosis not present

## 2017-06-22 DIAGNOSIS — C8210 Follicular lymphoma grade II, unspecified site: Secondary | ICD-10-CM | POA: Diagnosis not present

## 2017-06-22 DIAGNOSIS — R262 Difficulty in walking, not elsewhere classified: Secondary | ICD-10-CM

## 2017-06-22 NOTE — Therapy (Signed)
[facility] [address], [place], [place], [address] Phone: [phone]   Fax:  [phone]  Physical Therapy Treatment  Patient Details  Name: [name] MRN: [id number] Date of Birth: 04-Jul-1955 Referring Provider: Dr Naaman Plummer   Encounter Date: 06/22/2017  PT End of Session - 06/22/17 1621    Visit Number  6    Number of Visits  12    Date for PT Re-Evaluation  07/18/17    Authorization Type  30 visit limit;  had 12 visits HHPT, 6 visits HHOT = 18 visits;  leaves 12 visits for outpatient PT     Authorization - Number of Visits  12    PT Start Time  1530    PT Stop Time  1615    PT Time Calculation (min)  45 min    Activity Tolerance  Patient tolerated treatment well       Past Medical History:  Diagnosis Date  . Acute systolic HF (heart failure) ([place])   . Atrial fibrillation ([place])   . Diabetes mellitus, new onset ([place])   . Dyslipidemia   . Gout attack 01/16/2012  . Hypertension   . nhl dx'd 01/2010  . Venous insufficiency   . Venous stasis ulcers ([facility])     Past Surgical History:  Procedure Laterality Date  . BRONCHOSCOPY    . ESOPHAGOGASTRODUODENOSCOPY ENDOSCOPY     Dr Jearld Fenton  . INGUINAL HERNIA REPAIR     2001  . PORTA CATH INSERTION  2011    There were no vitals filed for this visit.  Subjective Assessment - 06/22/17 1537    Subjective  Just the usual ankle soreness and pain.  I iced the hip and slept with my legs up and my hip feels better.      Pertinent History  gout in left ankle     How long can you stand comfortably?  10-15 min     Currently in Pain?  Yes    Pain Score  5     Pain Location  Ankle    Aggravating Factors   chronic ankle pain all the time         [facility] PT Assessment - 06/22/17 0001      Strength   Right Hip Flexion  4/5    Right Hip Extension  4/5    Right Hip ABduction  4/5    Left Hip Flexion  4+/5    Left Hip Extension  4/5    Left Hip ABduction  4/5    Right  Knee Flexion  4+/5    Right Knee Extension  4/5    Left Knee Flexion  4+/5    Left Knee Extension  4/5      6 minute walk test results    Aerobic Endurance Distance Walked  425 with cane with 4 sitting rest breaks                   [facility] Adult PT Treatment/Exercise - 06/22/17 0001      Lumbar Exercises: Seated   Other Seated Lumbar Exercises  green band rows and extensions shoulder 20x each      Knee/Hip Exercises: Aerobic   Nustep  Seat 13, arms 11, 13 min L2 while discussing progress with PT      Knee/Hip Exercises: Standing   Forward Step Up  Right;Left;1 set;5 reps    Other Standing Knee Exercises  step and reach 10x each side  Other Standing Knee Exercises  red band around ankles side step, hip extension, circles clockwise and counterclockwise 10x each      Knee/Hip Exercises: Seated   Sit to [place]  10 reps;without UE support without arms               PT Short Term Goals - 06/22/17 1712      PT SHORT TERM GOAL #1   Title  The patient will be able to ambulate 500 feet with walker needed for increased community ambulation for grocery shopping, return to work, travel    Status  Achieved      PT [place] #2   Title  The patient will have improved gait speed with timed up and Go test to 14 sec     Time  4    Period  Weeks    Status  On-going      PT SHORT TERM GOAL #3   Title  BERG Balance test score improved to 45/56 indicating improved balance and stability     Time  4    Period  Weeks    Status  On-going      PT SHORT TERM GOAL #4   Title  The patient will have improved LE strength to grossly 4/5 needed for 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    Status  Achieved        PT Long Term Goals - 05/23/17 1852      PT LONG TERM GOAL #1   Title  The patient will independent in safe self progression of HEP    Time  8    Period  Weeks    Status  New    Target Date  07/18/17       PT LONG TERM GOAL #2   Title  The patient will be able to ambulate with a SPC short to medium community distances 500 feet     Time  8    Period  Weeks    Status  New      PT LONG TERM GOAL #3   Title  The patient will have LE strength to grossly 4+/5 needed to rise from a standard chair with minimal UE use     Time  8    Period  Weeks    Status  New      PT LONG TERM GOAL #4   Title  BERG balance score improved to 47/56 indicating improved balance and decreased risk for falls    Time  8    Period  Weeks    Status  New      PT LONG TERM GOAL #5   Title  Timed up and Go gait speed improved to 13 sec indicating improved  strength     Time  8    Period  Days    Status  New            Plan - 06/22/17 1621    Clinical Impression Statement  The patient continues to make steady progress with gait endurance and LE strength.  6 minute walk test has improved from 382 feet with walker to 425 feet with single point cane.  He continue to have moderate shortness of breath with walking and standing exercises which dissipates quickly with a 30 sec rest break.      Rehab Potential  Good    Clinical Impairments Affecting Rehab Potential  see numerous co-morbidities above    PT Frequency  2x / week    PT Duration  8 weeks    PT Treatment/Interventions  ADLs/Self Care Home Management;Electrical Stimulation;Ultrasound;Moist Heat;Cryotherapy;Therapeutic exercise;Balance training;Therapeutic activities;Functional mobility training;Gait training;Stair training;Neuromuscular re-education;Patient/family education;Manual techniques;Taping    PT Next Visit Plan  recheck Timed up and Go;  BERG and MMT for STGs       Patient will benefit from skilled therapeutic intervention in order to improve the following deficits and impairments:  Pain, Difficulty walking, Decreased balance, Decreased strength, Decreased activity tolerance, Decreased knowledge of use of DME  Visit Diagnosis: Muscle weakness  (generalized)  Difficulty in walking, not elsewhere classified     Problem List Patient Active Problem List   Diagnosis Date Noted  . Dyspnea on exertion 06/19/2017  . Chronic anticoagulation 05/22/2017  . Cardiomyopathy ([place]) 05/22/2017  . Hypotension 05/22/2017  . RBBB 05/22/2017  . UTI due to extended-spectrum beta lactamase (ESBL) producing Escherichia coli 04/06/2017  . Debility   . Wound infection   . OSA (obstructive sleep apnea)   . Acute systolic heart failure ([place])   . Morbid obesity ([place])   . Diabetes mellitus, new onset ([place])   . Dysuria   . Acute respiratory distress syndrome (ARDS) ([facility])   . Influenza A with pneumonia   . New onset atrial fibrillation ([place])   . Anxiety   . Current moderate episode of major depressive disorder without prior episode ([place])   . Obesity, Class III, BMI 40-49.9 (morbid obesity) ([place])   . Community acquired pneumonia   . Critical illness myopathy   . Dysphagia   . Non-Hodgkin's lymphoma ([place])   . Benign essential HTN   . Leukocytosis   . Hypokalemia   . Tracheostomy status ([place])   . Hypoxic   . Pressure injury of skin 02/28/2017  . Status post tracheostomy ([place])   . Follicular lymphoma grade II of intrathoracic lymph nodes ([place]) 05/02/2016  . Portacath in place 05/04/2015  . Gout attack 01/16/2012  . Follicular lymphoma grade II ([place]) 12/16/2010   Ruben Im, PT 06/22/17 5:18 PM Phone: [phone] Fax: [phone]  Alvera Singh 06/22/2017, 5:16 PM  [facility] [address], [place], [place], [address] Phone: [phone]   Fax:  [phone]  Name: [name] MRN: [id number] Date of Birth: Aug 28, 1955

## 2017-06-28 ENCOUNTER — Ambulatory Visit: Payer: BLUE CROSS/BLUE SHIELD | Admitting: Physical Therapy

## 2017-06-28 ENCOUNTER — Encounter: Payer: Self-pay | Admitting: Physical Therapy

## 2017-06-28 DIAGNOSIS — J9601 Acute respiratory failure with hypoxia: Secondary | ICD-10-CM

## 2017-06-28 DIAGNOSIS — Z930 Tracheostomy status: Secondary | ICD-10-CM | POA: Diagnosis not present

## 2017-06-28 DIAGNOSIS — R262 Difficulty in walking, not elsewhere classified: Secondary | ICD-10-CM

## 2017-06-28 DIAGNOSIS — R5381 Other malaise: Secondary | ICD-10-CM

## 2017-06-28 DIAGNOSIS — M6281 Muscle weakness (generalized): Secondary | ICD-10-CM | POA: Diagnosis not present

## 2017-06-28 DIAGNOSIS — J9602 Acute respiratory failure with hypercapnia: Secondary | ICD-10-CM

## 2017-06-28 DIAGNOSIS — C8210 Follicular lymphoma grade II, unspecified site: Secondary | ICD-10-CM | POA: Diagnosis not present

## 2017-06-28 NOTE — Therapy (Signed)
[facility] [address], [place], [place], [address] Phone: [phone]   Fax:  [phone]  Physical Therapy Treatment  Patient Details  Name: [name] MRN: [id number] Date of Birth: 12/20/1955 Referring Provider: Dr Naaman Plummer   Encounter Date: 06/28/2017  PT End of Session - 06/28/17 1619    Visit Number  7    Number of Visits  12    Date for PT Re-Evaluation  07/18/17    Authorization Type  30 visit limit;  had 12 visits HHPT, 6 visits HHOT = 18 visits;  leaves 12 visits for outpatient PT     Authorization - Number of Visits  12    PT Start Time  1617    PT Stop Time  1655    PT Time Calculation (min)  38 min    Activity Tolerance  Patient tolerated treatment well    Behavior During Therapy  WFL for tasks assessed/performed       Past Medical History:  Diagnosis Date  . Acute systolic HF (heart failure) ([place])   . Atrial fibrillation ([place])   . Diabetes mellitus, new onset ([place])   . Dyslipidemia   . Gout attack 01/16/2012  . Hypertension   . nhl dx'd 01/2010  . Venous insufficiency   . Venous stasis ulcers (HCC)     Past Surgical History:  Procedure Laterality Date  . BRONCHOSCOPY    . ESOPHAGOGASTRODUODENOSCOPY ENDOSCOPY     Dr Jearld Fenton  . INGUINAL HERNIA REPAIR     2001  . PORTA CATH INSERTION  2011    There were no vitals filed for this visit.  Subjective Assessment - 06/28/17 1621    Subjective  Feeling good, no complaints today. Walking with cane.    Pertinent History  gout in left ankle     Limitations  Walking;House hold activities;Standing    Currently in Pain?  Yes    Pain Score  5     Pain Location  Ankle    Pain Orientation  Left    Aggravating Factors   constant    Multiple Pain Sites  No         [facility] PT Assessment - 06/28/17 0001      Berg Balance Test   Sit to Stand  Able to stand  independently using hands    Standing Unsupported  Able to stand safely 2 minutes    Sitting with Back Unsupported but Feet Supported on Floor or Stool  Able to sit safely and securely 2 minutes    Stand to Sit  Sits safely with minimal use of hands    Transfers  Able to transfer safely, minor use of hands    Standing Unsupported with Eyes Closed  Able to stand 10 seconds safely    Standing Ubsupported with Feet Together  Able to place feet together independently and stand 1 minute safely    From Standing, Reach Forward with Outstretched Arm  Can reach forward >12 cm safely (5")    From Standing Position, Pick up Object from Floor  Able to pick up shoe safely and easily    From Standing Position, Turn to Look Behind Over each Shoulder  Looks behind from both sides and weight shifts well    Turn 360 Degrees  Able to turn 360 degrees safely one side only in 4 seconds or less    Standing Unsupported, Alternately Place Feet on Step/Stool  Able to complete >2 steps/needs minimal assist  Standing Unsupported, One Foot in Front  Able to plae foot ahead of the other independently and hold 30 seconds    Standing on One Leg  Tries to lift leg/unable to hold 3 seconds but remains standing independently    Total Score  46      Timed Up and Go Test   Normal TUG (seconds)  11                   [facility] Adult PT Treatment/Exercise - 06/28/17 0001      Knee/Hip Exercises: Aerobic   Nustep  Seat 13, arms 11, 13 min L2 while discussing progress with PT               PT Short Term Goals - 06/28/17 1643      PT SHORT TERM GOAL #2   Title  The patient will have improved gait speed with timed up and Go test to 14 sec     Time  4    Period  Weeks    Status  Achieved 11 sec      PT SHORT TERM GOAL #3   Title  BERG Balance test score improved to 45/56 indicating improved balance and stability     Time  4    Period  Weeks    Status  -- 46/56      PT SHORT TERM GOAL #4   Title  The patient will have improved LE strength to grossly 4/5 needed for greater ease with  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 He improved his BERG to 46/56, meeting STG, one point away from LTG. He is currently setting up discharge plans to eventually transition to [employer].     Rehab Potential  Good    Clinical Impairments Affecting Rehab Potential  see numerous co-morbidities above    PT Frequency  2x / week    PT Duration  8 weeks    PT Treatment/Interventions  ADLs/Self Care Home Management;Electrical Stimulation;Ultrasound;Moist Heat;Cryotherapy;Therapeutic exercise;Balance training;Therapeutic activities;Functional mobility training;Gait training;Stair training;Neuromuscular re-education;Patient/family education;Manual techniques;Taping    PT Next Visit Plan  Resume LE strength , MMT if time allows,     Consulted and Agree with Plan of Care  Patient       Patient will benefit from skilled therapeutic intervention in order to improve the following deficits and impairments:  Pain, Difficulty walking, Decreased balance, Decreased strength, Decreased activity tolerance, Decreased knowledge of use of DME  Visit Diagnosis: Muscle weakness (generalized)  Difficulty in walking, not elsewhere classified  Acute respiratory failure with hypoxia and hypercapnia (HCC)  Status post tracheostomy ([place])  Morbid obesity ([place])  Debility  Obesity, Class III, BMI 40-49.9 (morbid obesity) ([place])  Follicular lymphoma grade II, unspecified body region ([place])  Problem List Patient Active Problem List   Diagnosis Date Noted  . Dyspnea  on exertion 06/19/2017  . Chronic anticoagulation 05/22/2017  . Cardiomyopathy ([place]) 05/22/2017  . Hypotension 05/22/2017  . RBBB 05/22/2017  . UTI due to extended-spectrum beta lactamase (ESBL) producing Escherichia coli 04/06/2017  . Debility   . Wound infection   . OSA (obstructive sleep apnea)   . Acute systolic heart failure ([place])   . Morbid obesity ([place])   . Diabetes mellitus, new onset ([place])   . Dysuria   . Acute respiratory distress syndrome (ARDS) (HCC)   . Influenza A with pneumonia   . New onset atrial fibrillation ([place])   . Anxiety   . Current moderate episode of major depressive disorder without prior episode ([place])   . Obesity, Class III, BMI 40-49.9 (morbid obesity) ([place])   . Community acquired pneumonia   . Critical illness myopathy   . Dysphagia   . Non-Hodgkin's lymphoma ([place])   . Benign essential HTN   . Leukocytosis   . Hypokalemia   . Tracheostomy status ([place])   . Hypoxic   . Pressure injury of skin 02/28/2017  . Status post tracheostomy ([place])   . Follicular lymphoma grade II of intrathoracic lymph nodes ([place]) 05/02/2016  . Portacath in place 05/04/2015  . Gout attack 01/16/2012  . Follicular lymphoma grade II ([place]) 12/16/2010    Brieann Osinski, PTA 06/28/2017, 4:53 PM  [facility] [address] [place], [place], [address] Phone: [phone]   Fax:  [phone]  Name: BERN FARE MRN: [id number] Date of Birth: January 12, 1955

## 2017-07-03 DIAGNOSIS — G4733 Obstructive sleep apnea (adult) (pediatric): Secondary | ICD-10-CM | POA: Diagnosis not present

## 2017-07-04 ENCOUNTER — Encounter: Payer: BLUE CROSS/BLUE SHIELD | Admitting: Physical Therapy

## 2017-07-04 DIAGNOSIS — R05 Cough: Secondary | ICD-10-CM | POA: Diagnosis not present

## 2017-07-04 DIAGNOSIS — I509 Heart failure, unspecified: Secondary | ICD-10-CM | POA: Diagnosis not present

## 2017-07-04 DIAGNOSIS — J189 Pneumonia, unspecified organism: Secondary | ICD-10-CM | POA: Diagnosis not present

## 2017-07-04 DIAGNOSIS — I10 Essential (primary) hypertension: Secondary | ICD-10-CM | POA: Diagnosis not present

## 2017-07-06 ENCOUNTER — Encounter: Payer: BLUE CROSS/BLUE SHIELD | Admitting: Physical Therapy

## 2017-07-11 ENCOUNTER — Encounter: Payer: Self-pay | Admitting: Physical Therapy

## 2017-07-11 ENCOUNTER — Ambulatory Visit: Payer: BLUE CROSS/BLUE SHIELD | Attending: Physical Medicine & Rehabilitation | Admitting: Physical Therapy

## 2017-07-11 DIAGNOSIS — R5381 Other malaise: Secondary | ICD-10-CM | POA: Diagnosis not present

## 2017-07-11 DIAGNOSIS — Z6841 Body Mass Index (BMI) 40.0 and over, adult: Secondary | ICD-10-CM | POA: Diagnosis not present

## 2017-07-11 DIAGNOSIS — I10 Essential (primary) hypertension: Secondary | ICD-10-CM | POA: Diagnosis not present

## 2017-07-11 DIAGNOSIS — Z930 Tracheostomy status: Secondary | ICD-10-CM | POA: Diagnosis not present

## 2017-07-11 DIAGNOSIS — J9602 Acute respiratory failure with hypercapnia: Secondary | ICD-10-CM | POA: Insufficient documentation

## 2017-07-11 DIAGNOSIS — J9601 Acute respiratory failure with hypoxia: Secondary | ICD-10-CM | POA: Insufficient documentation

## 2017-07-11 DIAGNOSIS — M6281 Muscle weakness (generalized): Secondary | ICD-10-CM | POA: Diagnosis not present

## 2017-07-11 DIAGNOSIS — C8210 Follicular lymphoma grade II, unspecified site: Secondary | ICD-10-CM | POA: Insufficient documentation

## 2017-07-11 DIAGNOSIS — J189 Pneumonia, unspecified organism: Secondary | ICD-10-CM | POA: Diagnosis not present

## 2017-07-11 DIAGNOSIS — I509 Heart failure, unspecified: Secondary | ICD-10-CM | POA: Diagnosis not present

## 2017-07-11 DIAGNOSIS — R262 Difficulty in walking, not elsewhere classified: Secondary | ICD-10-CM | POA: Insufficient documentation

## 2017-07-11 NOTE — Therapy (Signed)
[facility] [address] [place], [place], [address] Phone: [phone]   Fax:  [phone]  Physical Therapy Treatment/Recertification   Patient Details  Name: [name] MRN: [id number] Date of Birth: [birth year] Referring Provider: Dr Naaman Plummer   Encounter Date: 07/11/2017  PT End of Session - 07/11/17 1535    Visit Number  8    Number of Visits  12    Date for PT Re-Evaluation  08/15/17    Authorization Type  30 visit limit;  had 12 visits HHPT, 6 visits HHOT = 18 visits;  leaves 12 visits for outpatient PT     Authorization - Number of Visits  12    PT Start Time  1530    PT Stop Time  1610    PT Time Calculation (min)  40 min    Activity Tolerance  Patient tolerated treatment well;Patient limited by fatigue       Past Medical History:  Diagnosis Date  . Acute systolic HF (heart failure) ([place])   . Atrial fibrillation ([place])   . Diabetes mellitus, new onset ([place])   . Dyslipidemia   . Gout attack 01/16/2012  . Hypertension   . nhl dx'd 01/2010  . Venous insufficiency   . Venous stasis ulcers (HCC)     Past Surgical History:  Procedure Laterality Date  . BRONCHOSCOPY    . ESOPHAGOGASTRODUODENOSCOPY ENDOSCOPY     Dr Jearld Fenton  . INGUINAL HERNIA REPAIR     2001  . PORTA CATH INSERTION  2011    There were no vitals filed for this visit.  Subjective Assessment - 07/11/17 1527    Subjective  I had pneumonia last week.  I couldn't work last week.   Finished antibiotic yesterday.   I'm still worn down.  My hip hurts from lying around last week.      Pertinent History  gout in left ankle     Limitations  Walking;House hold activities;Standing    Currently in Pain?  Yes    Pain Score  5     Pain Location  Ankle    Pain Orientation  Left    Pain Type  Chronic pain    Multiple Pain Sites  Yes    Pain Score  2    Pain Location  Hip    Pain Orientation  Left         [facility] PT Assessment - 07/11/17 0001       Strength   Right Hip Flexion  4+/5    Right Hip Extension  4/5    Right Hip ABduction  4/5    Left Hip Flexion  4+/5    Left Hip Extension  4/5    Left Hip ABduction  4+/5    Right Knee Flexion  4+/5    Right Knee Extension  4+/5    Left Knee Flexion  4+/5    Left Knee Extension  4/5      6 minute walk test results    Aerobic Endurance Distance Walked  425 with cane with 4 sitting rest breaks      [employer] comment:  46/56      Timed Up and Go Test   Normal TUG (seconds)  11                   [facility] Adult PT Treatment/Exercise - 07/11/17 0001      Knee/Hip Exercises: Aerobic   Nustep  Seat 13, arms 11, 13 min L1 while discussing progress with PT      Knee/Hip Exercises: Standing   Heel Raises  Both;1 set;10 reps    Lunge Walking - Round Trips  step and reach 5x each side    Gait Training  marching and side stepping 10x each     Other Standing Knee Exercises  alternating step taps abduction and extensions 10x each right/left     Other Standing Knee Exercises  step taps 10x right/left alternating      Knee/Hip Exercises: Seated   Long Arc Quad  Right;Left;15 reps;Weights    Long Arc Quad Weight  4 lbs.    Clamshell with TheraBand  Blue 3x10 single and double leg    Sit to Sand  2 sets;5 reps;without UE support on foam in chair      Shoulder Exercises: Seated   Extension  Strengthening;Both;20 reps;Theraband    Theraband Level (Shoulder Extension)  -- red band    Row  Strengthening;Both;10 reps;Theraband    Theraband Level (Shoulder Row)  Level 2 (Red)               PT Short Term Goals - 07/11/17 1533      PT SHORT TERM GOAL #1   Title  The patient will be able to ambulate 500 feet with walker needed for increased community ambulation for grocery shopping, return to work, travel    Status  Achieved      PT [place] #2   Status  Achieved      PT SHORT TERM GOAL #3   Title  BERG Balance test score improved to 45/56  indicating improved balance and stability     Status  On-going      PT SHORT TERM GOAL #4   Title  The patient will have improved LE strength to grossly 4/5 needed for 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    Status  Achieved        PT Long Term Goals - 07/11/17 1533      PT LONG TERM GOAL #1   Title  The patient will independent in safe self progression of HEP    Time  8    Period  Weeks    Status  On-going    Target Date  08/15/17      PT LONG TERM GOAL #2   Title  The patient will be able to ambulate with a SPC short to medium community distances 500 feet     Time  8    Period  Weeks    Status  On-going      PT LONG TERM GOAL #3   Title  The patient will have LE strength to grossly 4+/5 needed to rise from a standard chair with minimal UE use     Time  8    Period  Weeks    Status  On-going      PT LONG TERM GOAL #4   Title  BERG balance score improved to 47/56 indicating improved balance and decreased risk for falls    Time  8    Period  Weeks    Status  On-going      PT LONG TERM GOAL #5   Title  Timed up and Go gait speed improved to 13 sec indicating improved  strength     Status  Achieved  Plan - 07/11/17 1608    Clinical Impression Statement  The patient is still recovering from pneumonia last week so treatment modified with more frequent rest breaks, decreased intensity and repetition.  He becomes more short of breath with standing exercises than usual but normalizes within 15-20 sec with rest.  Did not retest BERG and Timed up and Go since these assessments were done on previous appt.  Therapist closely monitoring response throughout treatment session.  He should meet remaining goals in 4 more visits at a reduced frequency of 1x/week.      Rehab Potential  Good    Clinical Impairments Affecting Rehab Potential  see numerous co-morbidities above    PT Frequency  1x / week    PT  Duration  4 weeks    PT Treatment/Interventions  ADLs/Self Care Home Management;Electrical Stimulation;Ultrasound;Moist Heat;Cryotherapy;Therapeutic exercise;Balance training;Therapeutic activities;Functional mobility training;Gait training;Stair training;Neuromuscular re-education;Patient/family education;Manual techniques;Taping    PT Next Visit Plan  Standing and seated strengthening;  decreased frequency to 1x/week for 4 more weeks    Recommended Other Services  Recommend [employer] following PT       Patient will benefit from skilled therapeutic intervention in order to improve the following deficits and impairments:  Pain, Difficulty walking, Decreased balance, Decreased strength, Decreased activity tolerance, Decreased knowledge of use of DME  Visit Diagnosis: Muscle weakness (generalized) - Plan: PT plan of care cert/re-cert  Difficulty in walking, not elsewhere classified - Plan: PT plan of care cert/re-cert     Problem List Patient Active Problem List   Diagnosis Date Noted  . Dyspnea on exertion 06/19/2017  . Chronic anticoagulation 05/22/2017  . Cardiomyopathy ([place]) 05/22/2017  . Hypotension 05/22/2017  . RBBB 05/22/2017  . UTI due to extended-spectrum beta lactamase (ESBL) producing Escherichia coli 04/06/2017  . Debility   . Wound infection   . OSA (obstructive sleep apnea)   . Acute systolic heart failure ([place])   . Morbid obesity ([place])   . Diabetes mellitus, new onset ([place])   . Dysuria   . Acute respiratory distress syndrome (ARDS) (HCC)   . Influenza A with pneumonia   . New onset atrial fibrillation ([place])   . Anxiety   . Current moderate episode of major depressive disorder without prior episode ([place])   . Obesity, Class III, BMI 40-49.9 (morbid obesity) ([place])   . Community acquired pneumonia   . Critical illness myopathy   . Dysphagia   . Non-Hodgkin's lymphoma ([place])   . Benign essential HTN   . Leukocytosis   . Hypokalemia   . Tracheostomy status ([place])    . Hypoxic   . Pressure injury of skin 02/28/2017  . Status post tracheostomy ([place])   . Follicular lymphoma grade II of intrathoracic lymph nodes ([place]) 05/02/2016  . Portacath in place 05/04/2015  . Gout attack 01/16/2012  . Follicular lymphoma grade II ([place]) 12/16/2010     Ruben Im, PT 07/11/17 5:23 PM Phone: [phone] Fax: [phone] Alvera Singh 07/11/2017, Lajuan Lines PM  [facility] [address], [place], [place], [address] Phone: [phone]   Fax:  [phone]  Name: ROSHUN KLINGENSMITH MRN: [id number] Date of Birth: 08-21-1955

## 2017-07-19 ENCOUNTER — Encounter: Payer: Self-pay | Admitting: Physical Therapy

## 2017-07-19 ENCOUNTER — Ambulatory Visit: Payer: BLUE CROSS/BLUE SHIELD | Admitting: Physical Therapy

## 2017-07-19 DIAGNOSIS — M6281 Muscle weakness (generalized): Secondary | ICD-10-CM | POA: Diagnosis not present

## 2017-07-19 DIAGNOSIS — R262 Difficulty in walking, not elsewhere classified: Secondary | ICD-10-CM

## 2017-07-19 DIAGNOSIS — R5381 Other malaise: Secondary | ICD-10-CM | POA: Diagnosis not present

## 2017-07-19 DIAGNOSIS — J9601 Acute respiratory failure with hypoxia: Secondary | ICD-10-CM | POA: Diagnosis not present

## 2017-07-19 DIAGNOSIS — J9602 Acute respiratory failure with hypercapnia: Secondary | ICD-10-CM | POA: Diagnosis not present

## 2017-07-19 DIAGNOSIS — Z930 Tracheostomy status: Secondary | ICD-10-CM | POA: Diagnosis not present

## 2017-07-19 DIAGNOSIS — C8210 Follicular lymphoma grade II, unspecified site: Secondary | ICD-10-CM | POA: Diagnosis not present

## 2017-07-19 NOTE — Therapy (Signed)
[facility] [address], [place], [place], [address] Phone: [phone]   Fax:  [phone]  Physical Therapy Treatment  Patient Details  Name: [name] MRN: [id number] Date of Birth: 03-Mar-1955 Referring Provider: Dr Naaman Plummer   Encounter Date: 07/19/2017  PT End of Session - 07/19/17 1619    Visit Number  9    Number of Visits  12    Date for PT Re-Evaluation  08/15/17    Authorization Type  30 visit limit;  had 12 visits HHPT, 6 visits HHOT = 18 visits;  leaves 12 visits for outpatient PT     Authorization - Number of Visits  12    PT Start Time  1615    PT Stop Time  1653    PT Time Calculation (min)  38 min    Activity Tolerance  Patient tolerated treatment well    Behavior During Therapy  WFL for tasks assessed/performed       Past Medical History:  Diagnosis Date  . Acute systolic HF (heart failure) ([place])   . Atrial fibrillation ([place])   . Diabetes mellitus, new onset ([place])   . Dyslipidemia   . Gout attack 01/16/2012  . Hypertension   . nhl dx'd 01/2010  . Venous insufficiency   . Venous stasis ulcers (HCC)     Past Surgical History:  Procedure Laterality Date  . BRONCHOSCOPY    . ESOPHAGOGASTRODUODENOSCOPY ENDOSCOPY     Dr Jearld Fenton  . INGUINAL HERNIA REPAIR     2001  . PORTA CATH INSERTION  2011    There were no vitals filed for this visit.  Subjective Assessment - 07/19/17 1620    Subjective  I am better since the pneumonia. I worked 9 hrs yesterday and did great.     Pertinent History  gout in left ankle     Currently in Pain?  Yes    Pain Score  7     Pain Location  Ankle    Pain Orientation  Left    Pain Descriptors / Indicators  Sore    Aggravating Factors   Probably from working 9 hrs yesterday    Pain Relieving Factors  Not much, constant pain    Multiple Pain Sites  No                       [facility] Adult PT Treatment/Exercise - 07/19/17 0001      Knee/Hip  Exercises: Aerobic   Nustep  Seat 13, arms 11, 10 min L3 while discussing progress with PTA      Knee/Hip Exercises: Standing   Heel Raises  Both;2 sets;10 reps VC to contract LT quad    Gait Training  marching and side stepping 20x each     Other Standing Knee Exercises  side stepping and reverse walking  4x at counter top    Other Standing Knee Exercises  step taps 10x right/left alternating      Knee/Hip Exercises: Seated   Long Arc Quad  Strengthening;Both;1 set;20 reps;Weights    Long Arc Quad Weight  4 lbs.    Clamshell with TheraBand  Blue 3x10 single and double leg    Sit to Sand  1 set;10 reps;without UE support on foam in chair               PT Short Term Goals - 07/11/17 1533      PT SHORT TERM GOAL #1  Title  The patient will be able to ambulate 500 feet with walker needed for increased community ambulation for grocery shopping, return to work, travel    Status  Achieved      PT [place] #2   Status  Achieved      PT SHORT TERM GOAL #3   Title  BERG Balance test score improved to 45/56 indicating improved balance and stability     Status  On-going      PT SHORT TERM GOAL #4   Title  The patient will have improved LE strength to grossly 4/5 needed for 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    Status  Achieved        PT Long Term Goals - 07/11/17 1533      PT LONG TERM GOAL #1   Title  The patient will independent in safe self progression of HEP    Time  8    Period  Weeks    Status  On-going    Target Date  08/15/17      PT LONG TERM GOAL #2   Title  The patient will be able to ambulate with a SPC short to medium community distances 500 feet     Time  8    Period  Weeks    Status  On-going      PT LONG TERM GOAL #3   Title  The patient will have LE strength to grossly 4+/5 needed to rise from a standard chair with minimal UE use     Time  8    Period  Weeks    Status   On-going      PT LONG TERM GOAL #4   Title  BERG balance score improved to 47/56 indicating improved balance and decreased risk for falls    Time  8    Period  Weeks    Status  On-going      PT LONG TERM GOAL #5   Title  Timed up and Go gait speed improved to 13 sec indicating improved  strength     Status  Achieved            Plan - 07/19/17 1619    Clinical Impression Statement  Pt feeling much better today post pneumonia recovery. No shortness of breathe with exercises, even Nustep, and no unplanned rest breaks needed. Increased volume of most exercises today which pt handled very well.  Lt ankle is his biggest limiting factor with balance/standing today.     Rehab Potential  Good    Clinical Impairments Affecting Rehab Potential  see numerous co-morbidities above    PT Frequency  1x / week    PT Duration  4 weeks    PT Next Visit Plan  Standing and seated strengthening;  decreased frequency to 1x/week for 4 more weeks    Consulted and Agree with Plan of Care  Patient       Patient will benefit from skilled therapeutic intervention in order to improve the following deficits and impairments:  Pain, Difficulty walking, Decreased balance, Decreased strength, Decreased activity tolerance, Decreased knowledge of use of DME  Visit Diagnosis: Muscle weakness (generalized)  Difficulty in walking, not elsewhere classified  Acute respiratory failure with hypoxia and hypercapnia (HCC)  Status post tracheostomy (HCC)  Morbid obesity (HCC)  Debility  Obesity, Class III, BMI 40-49.9 (morbid obesity) ([place])  Follicular lymphoma grade II, unspecified  body region [facility])     Problem List Patient Active Problem List   Diagnosis Date Noted  . Dyspnea on exertion 06/19/2017  . Chronic anticoagulation 05/22/2017  . Cardiomyopathy ([place]) 05/22/2017  . Hypotension 05/22/2017  . RBBB 05/22/2017  . UTI due to extended-spectrum beta lactamase (ESBL) producing Escherichia coli  04/06/2017  . Debility   . Wound infection   . OSA (obstructive sleep apnea)   . Acute systolic heart failure ([place])   . Morbid obesity ([place])   . Diabetes mellitus, new onset ([place])   . Dysuria   . Acute respiratory distress syndrome (ARDS) (HCC)   . Influenza A with pneumonia   . New onset atrial fibrillation ([place])   . Anxiety   . Current moderate episode of major depressive disorder without prior episode ([place])   . Obesity, Class III, BMI 40-49.9 (morbid obesity) ([place])   . Community acquired pneumonia   . Critical illness myopathy   . Dysphagia   . Non-Hodgkin's lymphoma ([place])   . Benign essential HTN   . Leukocytosis   . Hypokalemia   . Tracheostomy status ([place])   . Hypoxic   . Pressure injury of skin 02/28/2017  . Status post tracheostomy ([employer])   . Follicular lymphoma grade II of intrathoracic lymph nodes ([place]) 05/02/2016  . Portacath in place 05/04/2015  . Gout attack 01/16/2012  . Follicular lymphoma grade II ([place]) 12/16/2010    Lindley Hiney, PTA 07/19/2017, 4:52 PM  [facility] [address] [place], [place], [address] Phone: [phone]   Fax:  [phone]  Name: [name] MRN: [id number] Date of Birth: 03/01/1955

## 2017-07-24 ENCOUNTER — Other Ambulatory Visit: Payer: Self-pay | Admitting: Physical Medicine & Rehabilitation

## 2017-07-24 ENCOUNTER — Other Ambulatory Visit: Payer: Self-pay

## 2017-07-24 ENCOUNTER — Ambulatory Visit (HOSPITAL_COMMUNITY): Payer: BLUE CROSS/BLUE SHIELD | Attending: Cardiovascular Disease

## 2017-07-24 DIAGNOSIS — E119 Type 2 diabetes mellitus without complications: Secondary | ICD-10-CM | POA: Diagnosis not present

## 2017-07-24 DIAGNOSIS — I4510 Unspecified right bundle-branch block: Secondary | ICD-10-CM | POA: Insufficient documentation

## 2017-07-24 DIAGNOSIS — Z7901 Long term (current) use of anticoagulants: Secondary | ICD-10-CM | POA: Diagnosis not present

## 2017-07-24 DIAGNOSIS — I340 Nonrheumatic mitral (valve) insufficiency: Secondary | ICD-10-CM | POA: Diagnosis not present

## 2017-07-24 DIAGNOSIS — J80 Acute respiratory distress syndrome: Secondary | ICD-10-CM

## 2017-07-24 DIAGNOSIS — C8210 Follicular lymphoma grade II, unspecified site: Secondary | ICD-10-CM

## 2017-07-24 DIAGNOSIS — I959 Hypotension, unspecified: Secondary | ICD-10-CM | POA: Insufficient documentation

## 2017-07-24 DIAGNOSIS — Z87891 Personal history of nicotine dependence: Secondary | ICD-10-CM | POA: Diagnosis not present

## 2017-07-24 DIAGNOSIS — G4733 Obstructive sleep apnea (adult) (pediatric): Secondary | ICD-10-CM | POA: Diagnosis not present

## 2017-07-24 DIAGNOSIS — I872 Venous insufficiency (chronic) (peripheral): Secondary | ICD-10-CM | POA: Diagnosis not present

## 2017-07-24 DIAGNOSIS — I429 Cardiomyopathy, unspecified: Secondary | ICD-10-CM | POA: Insufficient documentation

## 2017-07-24 DIAGNOSIS — Z6841 Body Mass Index (BMI) 40.0 and over, adult: Secondary | ICD-10-CM | POA: Diagnosis not present

## 2017-07-24 DIAGNOSIS — I4891 Unspecified atrial fibrillation: Secondary | ICD-10-CM | POA: Insufficient documentation

## 2017-07-24 DIAGNOSIS — I5022 Chronic systolic (congestive) heart failure: Secondary | ICD-10-CM | POA: Diagnosis not present

## 2017-07-24 DIAGNOSIS — C8212 Follicular lymphoma grade II, intrathoracic lymph nodes: Secondary | ICD-10-CM | POA: Diagnosis not present

## 2017-07-31 ENCOUNTER — Ambulatory Visit: Payer: BLUE CROSS/BLUE SHIELD | Admitting: Cardiology

## 2017-08-01 ENCOUNTER — Encounter: Payer: Self-pay | Admitting: Physical Therapy

## 2017-08-01 ENCOUNTER — Ambulatory Visit: Payer: BLUE CROSS/BLUE SHIELD | Admitting: Physical Therapy

## 2017-08-01 DIAGNOSIS — J9602 Acute respiratory failure with hypercapnia: Secondary | ICD-10-CM | POA: Diagnosis not present

## 2017-08-01 DIAGNOSIS — R262 Difficulty in walking, not elsewhere classified: Secondary | ICD-10-CM

## 2017-08-01 DIAGNOSIS — Z930 Tracheostomy status: Secondary | ICD-10-CM | POA: Diagnosis not present

## 2017-08-01 DIAGNOSIS — J9601 Acute respiratory failure with hypoxia: Secondary | ICD-10-CM | POA: Diagnosis not present

## 2017-08-01 DIAGNOSIS — C8210 Follicular lymphoma grade II, unspecified site: Secondary | ICD-10-CM | POA: Diagnosis not present

## 2017-08-01 DIAGNOSIS — M6281 Muscle weakness (generalized): Secondary | ICD-10-CM | POA: Diagnosis not present

## 2017-08-01 DIAGNOSIS — R5381 Other malaise: Secondary | ICD-10-CM | POA: Diagnosis not present

## 2017-08-01 NOTE — Therapy (Signed)
[facility] [address], [place], [place], [address] Phone: [phone]   Fax:  [phone]  Physical Therapy Treatment  Patient Details  Name: [name] MRN: [id number] Date of Birth: 07-Dec-1955 Referring Provider: Dr Naaman Plummer   Encounter Date: 08/01/2017  PT End of Session - 08/01/17 1557    Visit Number  10    Number of Visits  12    Date for PT Re-Evaluation  08/15/17    Authorization Type  30 visit limit;  had 12 visits HHPT, 6 visits HHOT = 18 visits;  leaves 12 visits for outpatient PT     Authorization - Number of Visits  12    PT Start Time  1528    PT Stop Time  1551 patient needs to leave early    PT Time Calculation (min)  23 min    Activity Tolerance  Patient tolerated treatment well       Past Medical History:  Diagnosis Date  . Acute systolic HF (heart failure) ([place])   . Atrial fibrillation ([place])   . Diabetes mellitus, new onset ([place])   . Dyslipidemia   . Gout attack 01/16/2012  . Hypertension   . nhl dx'd 01/2010  . Venous insufficiency   . Venous stasis ulcers (H[facility])     Past Surgical History:  Procedure Laterality Date  . BRONCHOSCOPY    . ESOPHAGOGASTRODUODENOSCOPY ENDOSCOPY     Dr Jearld Fenton  . INGUINAL HERNIA REPAIR     2001  . PORTA CATH INSERTION  2011    There were no vitals filed for this visit.  Subjective Assessment - 08/01/17 1533    Subjective  I'm over the [place] but still my breathing is affected.  I need to leave in 20 minutes.  Continues to use SPC.  States he's back to the level he was before recent bout of pneumonia.      How long can you walk comfortably?  around [employer]     Patient Stated Goals  walk with the cane full time or nothing at all; get breathing better with activity     Currently in Pain?  No/denies    Pain Score  0-No pain                       [facility] Adult PT Treatment/Exercise - 08/01/17 0001      Knee/Hip Exercises: Stretches   Active Hamstring Stretch  Right;Left;5 reps on 2nd step     Hip Flexor Stretch  Right;Left;5 reps on 2nd step       Knee/Hip Exercises: Aerobic   Nustep  Seat 13, arms 11, 7 min L3 while discussing progress with PT     Knee/Hip Exercises: Standing   Forward Step Up  Right;Left;10 reps;Hand Hold: 2;Step Height: 6"    Other Standing Knee Exercises  SLS with forward reach to 3rd step 5x each side      Shoulder Exercises: Standing   Extension  Strengthening;Both;20 reps with and without staggered stand    Theraband Level (Shoulder Extension)  Level 2 (Red)    Row  Strengthening;Both;20 reps with and without staggered stand    Theraband Level (Shoulder Row)  Level 2 (Red)    Other Standing Exercises  red band UE movements with 1 foot prop on BOSU 10x each side    Other Standing Exercises  red band pulls with head rotation in staggered stand 10x  PT Short Term Goals - 07/11/17 1533      PT SHORT TERM GOAL #1   Title  The patient will be able to ambulate 500 feet with walker needed for increased community ambulation for grocery shopping, return to work, travel    Status  Achieved      PT [place] #2   Status  Achieved      PT SHORT TERM GOAL #3   Title  BERG Balance test score improved to 45/56 indicating improved balance and stability     Status  On-going      PT SHORT TERM GOAL #4   Title  The patient will have improved LE strength to grossly 4/5 needed for 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    Status  Achieved        PT Long Term Goals - 07/11/17 1533      PT LONG TERM GOAL #1   Title  The patient will independent in safe self progression of HEP    Time  8    Period  Weeks    Status  On-going    [place] Date  08/15/17      PT LONG TERM GOAL #2   Title  The patient will be able to ambulate with a SPC short to medium community distances 500 feet     Time  8    Period  Weeks     Status  On-going      PT LONG TERM GOAL #3   Title  The patient will have LE strength to grossly 4+/5 needed to rise from a standard chair with minimal UE use     Time  8    Period  Weeks    Status  On-going      PT LONG TERM GOAL #4   Title  BERG balance score improved to 47/56 indicating improved balance and decreased risk for falls    Time  8    Period  Weeks    Status  On-going      PT LONG TERM GOAL #5   Title  Timed up and Go gait speed improved to 13 sec indicating improved  strength     Status  Achieved            Plan - 08/01/17 1558    Clinical Impression Statement  Shortened treatment session per patient request secondary to another obligation.  He is able to participate in moderate challenges to balance in staggered standing and single limb positions.  No rest breaks needed today and minimal shortness of breath.  Progressing well with rehab goals.     Rehab Potential  Good    Clinical Impairments Affecting Rehab Potential  see numerous co-morbidities above    PT Frequency  1x / week    PT Duration  4 weeks    PT Treatment/Interventions  ADLs/Self Care Home Management;Electrical Stimulation;Ultrasound;Moist Heat;Cryotherapy;Therapeutic exercise;Balance training;Therapeutic activities;Functional mobility training;Gait training;Stair training;Neuromuscular re-education;Patient/family education;Manual techniques;Taping    PT Next Visit Plan  Standing and seated strengthening;  BERG, 6 min walk test;  check progress with rehab goals        Patient will benefit from skilled therapeutic intervention in order to improve the following deficits and impairments:  Pain, Difficulty walking, Decreased balance, Decreased strength, Decreased activity tolerance, Decreased knowledge of use of DME  Visit Diagnosis: Muscle weakness (generalized)  Difficulty in walking, not elsewhere classified  Problem List Patient Active Problem List   Diagnosis Date Noted  . Dyspnea on  exertion 06/19/2017  . Chronic anticoagulation 05/22/2017  . Cardiomyopathy ([place]) 05/22/2017  . Hypotension 05/22/2017  . RBBB 05/22/2017  . UTI due to extended-spectrum beta lactamase (ESBL) producing Escherichia coli 04/06/2017  . Debility   . Wound infection   . OSA (obstructive sleep apnea)   . Acute systolic heart failure ([place])   . Morbid obesity ([place])   . Diabetes mellitus, new onset ([place])   . Dysuria   . Acute respiratory distress syndrome (ARDS) (H[facility])   . Influenza A with pneumonia   . New onset atrial fibrillation ([place])   . Anxiety   . Current moderate episode of major depressive disorder without prior episode ([place])   . Obesity, Class III, BMI 40-49.9 (morbid obesity) ([place])   . Community acquired pneumonia   . Critical illness myopathy   . Dysphagia   . Non-Hodgkin's lymphoma ([place])   . Benign essential HTN   . Leukocytosis   . Hypokalemia   . Tracheostomy status ([place])   . Hypoxic   . Pressure injury of skin 02/28/2017  . Status post tracheostomy ([place])   . Follicular lymphoma grade II of intrathoracic lymph nodes ([place]) 05/02/2016  . Portacath in place 05/04/2015  . Gout attack 01/16/2012  . Follicular lymphoma grade II ([place]) 12/16/2010   Ruben Im, PT 08/01/17 5:22 PM Phone: [phone] Fax: [phone]  Alvera Singh 08/01/2017, Lajuan Lines PM  [facility] [address] [place], [place], [address] Phone: [phone]   Fax:  [phone]  Name: PORTER MOES MRN: [id number] Date of Birth: [birth year]

## 2017-08-02 DIAGNOSIS — G4733 Obstructive sleep apnea (adult) (pediatric): Secondary | ICD-10-CM | POA: Diagnosis not present

## 2017-08-03 ENCOUNTER — Encounter: Payer: BLUE CROSS/BLUE SHIELD | Admitting: Physical Therapy

## 2017-08-08 ENCOUNTER — Ambulatory Visit: Payer: BLUE CROSS/BLUE SHIELD | Admitting: Physical Therapy

## 2017-08-08 ENCOUNTER — Encounter: Payer: Self-pay | Admitting: Physical Therapy

## 2017-08-08 DIAGNOSIS — J9602 Acute respiratory failure with hypercapnia: Secondary | ICD-10-CM | POA: Diagnosis not present

## 2017-08-08 DIAGNOSIS — Z930 Tracheostomy status: Secondary | ICD-10-CM | POA: Diagnosis not present

## 2017-08-08 DIAGNOSIS — R262 Difficulty in walking, not elsewhere classified: Secondary | ICD-10-CM

## 2017-08-08 DIAGNOSIS — R5381 Other malaise: Secondary | ICD-10-CM | POA: Diagnosis not present

## 2017-08-08 DIAGNOSIS — J9601 Acute respiratory failure with hypoxia: Secondary | ICD-10-CM | POA: Diagnosis not present

## 2017-08-08 DIAGNOSIS — M6281 Muscle weakness (generalized): Secondary | ICD-10-CM

## 2017-08-08 DIAGNOSIS — C8210 Follicular lymphoma grade II, unspecified site: Secondary | ICD-10-CM | POA: Diagnosis not present

## 2017-08-08 NOTE — Therapy (Signed)
[facility] [address], [place], [place], [address] Phone: [phone]   Fax:  [phone]  Physical Therapy Treatment  Patient Details  Name: [name] MRN: [id number] Date of Birth: 08-Jan-1956 Referring Provider: Dr Naaman Plummer   Encounter Date: 08/08/2017  PT End of Session - 08/08/17 1704    Visit Number  11    Number of Visits  12    Date for PT Re-Evaluation  08/15/17    Authorization Type  30 visit limit;  had 12 visits HHPT, 6 visits HHOT = 18 visits;  leaves 12 visits for outpatient PT     Authorization - Number of Visits  12    PT Start Time  1530    PT Stop Time  1615    PT Time Calculation (min)  45 min    Activity Tolerance  Patient tolerated treatment well       Past Medical History:  Diagnosis Date  . Acute systolic HF (heart failure) ([place])   . Atrial fibrillation ([place])   . Diabetes mellitus, new onset ([place])   . Dyslipidemia   . Gout attack 01/16/2012  . Hypertension   . nhl dx'd 01/2010  . Venous insufficiency   . Venous stasis ulcers ([facility])     Past Surgical History:  Procedure Laterality Date  . BRONCHOSCOPY    . ESOPHAGOGASTRODUODENOSCOPY ENDOSCOPY     Dr Jearld Fenton  . INGUINAL HERNIA REPAIR     2001  . PORTA CATH INSERTION  2011    There were no vitals filed for this visit.  Subjective Assessment - 08/08/17 1525    Subjective  Continues to use SPC in left hand.  My hip still drops when I get tired.  My left foot is painful (long standing problem.)    I'm still working on the breathing.       How long can you walk comfortably?  around [employer]     Currently in Pain?  No/denies    Pain Score  0-No pain         [facility] PT Assessment - 08/08/17 0001      6 minute walk test results    Aerobic Endurance Distance Walked  560 2 rest breaks secondary to shortness of breath      Berg Balance Test   Sit to Stand  Able to stand without using hands and stabilize independently    Standing  Unsupported  Able to stand safely 2 minutes    Sitting with Back Unsupported but Feet Supported on Floor or Stool  Able to sit safely and securely 2 minutes    Stand to Sit  Sits safely with minimal use of hands    Transfers  Able to transfer safely, minor use of hands    Standing Unsupported with Eyes Closed  Able to stand 10 seconds safely    Standing Ubsupported with Feet Together  Able to place feet together independently and stand 1 minute safely    From Standing, Reach Forward with Outstretched Arm  Can reach forward >12 cm safely (5")    From Standing Position, Pick up Object from Floor  Able to pick up shoe safely and easily    From Standing Position, Turn to Look Behind Over each Shoulder  Looks behind from both sides and weight shifts well    Turn 360 Degrees  Able to turn 360 degrees safely one side only in 4 seconds or less    Standing Unsupported, Alternately  Place Feet on Step/Stool  Able to stand independently and safely and complete 8 steps in 20 seconds    Standing Unsupported, One Foot in Front  Able to plae foot ahead of the other independently and hold 30 seconds    Standing on One Leg  Tries to lift leg/unable to hold 3 seconds but remains standing independently    Total Score  50    Berg comment:  50/56                   [facility] Adult PT Treatment/Exercise - 08/08/17 0001      Knee/Hip Exercises: Stretches   Active Hamstring Stretch  Right;Left;5 reps on 2nd step     Hip Flexor Stretch  Right;Left;5 reps on 2nd step       Knee/Hip Exercises: Aerobic   Nustep  Seat 13, arms 11, 10 min L3 while discussing progress with PTA      Knee/Hip Exercises: Standing   Forward Step Up  Right;Left;10 reps;Hand Hold: 2;Step Height: 6" up and over next step touch    Other Standing Knee Exercises  blue band hip abduction and extension 5x right/left    Other Standing Knee Exercises  lateral step up 6 inch 5x right/left                PT Short Term Goals -  08/08/17 1547      PT SHORT TERM GOAL #1   Title  The patient will be able to ambulate 500 feet with walker needed for increased community ambulation for grocery shopping, return to work, travel    Status  Achieved      PT [place] #2   Title  The patient will have improved gait speed with timed up and Go test to 14 sec     Status  Achieved      PT SHORT TERM GOAL #3   Title  BERG Balance test score improved to 45/56 indicating improved balance and stability     Status  Achieved      PT SHORT TERM GOAL #4   Title  The patient will have improved LE strength to grossly 4/5 needed for 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    Status  Achieved        PT Long Term Goals - 08/08/17 1547      PT LONG TERM GOAL #1   Title  The patient will independent in safe self progression of HEP    Time  8    Period  Weeks    Status  On-going      PT LONG TERM GOAL #2   Title  The patient will be able to ambulate with a SPC short to medium community distances 500 feet     Status  Achieved      PT LONG TERM GOAL #3   Title  The patient will have LE strength to grossly 4+/5 needed to rise from a standard chair with minimal UE use     Time  8    Period  Weeks    Status  On-going      PT LONG TERM GOAL #4   Title  BERG balance score improved to 47/56 indicating improved balance and decreased risk for falls    Status  Achieved      PT LONG TERM GOAL #5   Status  Achieved  Plan - 08/08/17 1704    Clinical Impression Statement  The patient has made excellent improvements in balance as indicated with BERG score.  His primary difficulties are with single leg standing and narrow base of support which may be partly related to his left chronic ankle/foot pain.  Improved gait tolerance with 6 minute walk test although 2 rest breaks needed secondary to shortness of breath.  He continues to have right hip weakness which  causes a pelvic drop intermittently especially with fatigue.  He should meet remaining rehab goals and  be ready for discharge from PT at that time.      Rehab Potential  Good    Clinical Impairments Affecting Rehab Potential  see numerous co-morbidities above    PT Frequency  1x / week    PT Duration  4 weeks    PT Treatment/Interventions  ADLs/Self Care Home Management;Electrical Stimulation;Ultrasound;Moist Heat;Cryotherapy;Therapeutic exercise;Balance training;Therapeutic activities;Functional mobility training;Gait training;Stair training;Neuromuscular re-education;Patient/family education;Manual techniques;Taping    PT Next Visit Plan  TUG, MMT;  check remaining goals;  right hip strengthening; discharge       Patient will benefit from skilled therapeutic intervention in order to improve the following deficits and impairments:  Pain, Difficulty walking, Decreased balance, Decreased strength, Decreased activity tolerance, Decreased knowledge of use of DME  Visit Diagnosis: Muscle weakness (generalized)  Difficulty in walking, not elsewhere classified     Problem List Patient Active Problem List   Diagnosis Date Noted  . Dyspnea on exertion 06/19/2017  . Chronic anticoagulation 05/22/2017  . Cardiomyopathy ([place]) 05/22/2017  . Hypotension 05/22/2017  . RBBB 05/22/2017  . UTI due to extended-spectrum beta lactamase (ESBL) producing Escherichia coli 04/06/2017  . Debility   . Wound infection   . OSA (obstructive sleep apnea)   . Acute systolic heart failure ([place])   . Morbid obesity ([place])   . Diabetes mellitus, new onset ([place])   . Dysuria   . Acute respiratory distress syndrome (ARDS) ([facility])   . Influenza A with pneumonia   . New onset atrial fibrillation ([place])   . Anxiety   . Current moderate episode of major depressive disorder without prior episode ([place])   . Obesity, Class III, BMI 40-49.9 (morbid obesity) ([place])   . Community acquired pneumonia   . Critical illness  myopathy   . Dysphagia   . Non-Hodgkin's lymphoma ([place])   . Benign essential HTN   . Leukocytosis   . Hypokalemia   . Tracheostomy status ([place])   . Hypoxic   . Pressure injury of skin 02/28/2017  . Status post tracheostomy ([place])   . Follicular lymphoma grade II of intrathoracic lymph nodes ([place]) 05/02/2016  . Portacath in place 05/04/2015  . Gout attack 01/16/2012  . Follicular lymphoma grade II ([place]) 12/16/2010   Ruben Im, PT 08/08/17 5:13 PM Phone: [phone] Fax: [phone]  Alvera Singh 08/08/2017, 5:12 PM  [facility] [address] [place], [place], [address] Phone: [phone]   Fax:  [phone]  Name: [name] MRN: [id number] Date of Birth: [birth year]

## 2017-08-15 ENCOUNTER — Encounter: Payer: BLUE CROSS/BLUE SHIELD | Admitting: Physical Therapy

## 2017-08-20 ENCOUNTER — Other Ambulatory Visit: Payer: Self-pay | Admitting: Physical Medicine & Rehabilitation

## 2017-08-20 ENCOUNTER — Encounter: Payer: Self-pay | Admitting: Cardiology

## 2017-08-20 DIAGNOSIS — C8210 Follicular lymphoma grade II, unspecified site: Secondary | ICD-10-CM

## 2017-08-20 NOTE — Progress Notes (Signed)
Cardiology Office Note   Date:  08/22/2017   ID:  [name], DOB 1955-04-06, MRN [id number]  PCP:  [name], MD  Cardiologist:   No primary care provider on file.   Chief Complaint  Patient presents with  . Atrial Fibrillation      History of Present Illness: [name] is a 62 y.o. male who presents for evaluation of cardiomyopathy.  He was in the hospital earlier this year with respiratory failure related to ARDS/influenza A.   He was found to have an EF of 25% which was new and new onset atrial fib.  He had a protracted hospital stay and went to rehab afterward.  He had a follow up echo last month and his EF had improved back to normal.  He is being followed by Dr. Lamonte Sakai for sleep apnea.   He feels much better.  He is back to working part-time.  He is doing training with a Physiological scientist and is about to finish rehab.  Still has to walk with a cane because his hips are weak.  He denies any shortness of breath, PND or orthopnea.  He is not noticing any atrial fibrillation and has no palpitations, presyncope or syncope.  He has no chest pressure, neck or arm discomfort.  He says he did have community-acquired pneumonia again since he was last seen.  Past Medical History:  Diagnosis Date  . Acute systolic HF (heart failure) ([place])   . Atrial fibrillation ([place])   . Diabetes mellitus, new onset ([place])   . Dyslipidemia   . Gout attack 01/16/2012  . Hypertension   . NHL dx'd 01/2010  . Venous insufficiency   . Venous stasis ulcers ([facility])     Past Surgical History:  Procedure Laterality Date  . BRONCHOSCOPY    . ESOPHAGOGASTRODUODENOSCOPY ENDOSCOPY     Dr Jearld Fenton  . INGUINAL HERNIA REPAIR     2001  . PORTA CATH INSERTION  2011     Current Outpatient Medications  Medication Sig Dispense Refill  . acetaminophen (TYLENOL) 325 MG tablet Take 650 mg by mouth every 6 (six) hours as needed.    [name] Kitchen albuterol (PROVENTIL HFA;VENTOLIN HFA) 108 (90 Base) MCG/ACT inhaler Inhale 2  puffs into the lungs every 6 (six) hours as needed for wheezing or shortness of breath. 1 Inhaler 2  . apixaban (ELIQUIS) 5 MG TABS tablet Take 1 tablet (5 mg total) by mouth 2 (two) times daily. 180 tablet 3  . atorvastatin (LIPITOR) 20 MG tablet TAKE 1 TABLET BY MOUTH EVERY DAY 30 tablet 2  . carvedilol (COREG) 25 MG tablet TAKE 1 TABLET (25 MG TOTAL) BY MOUTH 2 (TWO) TIMES DAILY WITH A MEAL. 60 tablet 2  . febuxostat (ULORIC) 40 MG tablet Take 1 tablet (40 mg total) by mouth daily. 30 tablet 0  . furosemide (LASIX) 40 MG tablet TAKE 1 AND 1/2 TABLETS BY MOUTH 2 TIMES DAILY 90 tablet 2  . losartan (COZAAR) 25 MG tablet TAKE 1 TABLET BY MOUTH EVERY DAY 30 tablet 2  . Melatonin 3 MG TABS Take 2 tablets (6 mg total) by mouth at bedtime. 60 tablet 0  . Multiple Vitamin (MULTIVITAMIN WITH MINERALS) TABS tablet Take 1 tablet by mouth daily.    . pantoprazole (PROTONIX) 40 MG tablet TAKE 1 TABLET BY MOUTH EVERY DAY 30 tablet 2  . potassium chloride SA (KLOR-CON M20) 20 MEQ tablet Take 2 tablets (40 mEq total) by mouth 2 (two) times daily.  180 tablet 3  . Probiotic Product (PROBIOTIC DAILY PO) Take by mouth.    . sertraline (ZOLOFT) 50 MG tablet Take 1 tablet (50 mg total) by mouth daily. 30 tablet 0  . sodium chloride (OCEAN) 0.65 % SOLN nasal spray Place 2 sprays into both nostrils as needed for congestion.  0  . vitamin C (VITAMIN C) 500 MG tablet Take 1 tablet (500 mg total) by mouth 3 (three) times daily.     No current facility-administered medications for this visit.    Facility-Administered Medications Ordered in Other Visits  Medication Dose Route Frequency Provider Last Rate Last Dose  . sodium chloride 0.9 % injection 10 mL  10 mL Intravenous PRN Curt Bears, MD   10 mL at 05/04/15 1529    Allergies:   Latex and Penicillins    ROS:  Please see the history of present illness.   Otherwise, review of systems are positive for none.   All other systems are reviewed and negative.     PHYSICAL EXAM: VS:  BP 124/74 (BP Location: Left Arm, Patient Position: Sitting)   Pulse 90   Ht 5\' 11"  (1.803 m)   Wt (!) 330 lb 9.6 oz (150 kg)   SpO2 96%   BMI 46.11 kg/m  , BMI Body mass index is 46.11 kg/m.  GENERAL:  Well appearing NECK:  No jugular venous distention, waveform within normal limits, carotid upstroke brisk and symmetric, no bruits, no thyromegaly LUNGS:  Clear to auscultation bilaterally CHEST:  Unremarkable HEART:  PMI not displaced or sustained,S1 and S2 within normal limits, no S3, no clicks, no rubs, irregular  ABD:  Flat, positive bowel sounds normal in frequency in pitch, no bruits, no rebound, no guarding, no midline pulsatile mass, no hepatomegaly, no splenomegaly EXT:  2 plus pulses throughout, no edema, no cyanosis no clubbing    EKG:  EKG is not ordered today.    Recent Labs: 02/22/2017: B Natriuretic Peptide 401.1 03/22/2017: Magnesium 1.6 03/23/2017: ALT 38 05/22/2017: BUN 12; Creatinine, Ser 1.02; Hemoglobin 12.4; Platelets 262; Potassium 4.5; Sodium 137    Lipid Panel    Component Value Date/Time   TRIG 122 02/26/2017 1306      Wt Readings from Last 3 Encounters:  08/22/17 (!) 330 lb 9.6 oz (150 kg)  08/21/17 (!) 328 lb 12.8 oz (149.1 kg)  06/19/17 (!) 313 lb (142 kg)      Other studies Reviewed: Additional studies/ records that were reviewed today include: None. Review of the above records demonstrates:  Please see elsewhere in the note.     ASSESSMENT AND PLAN:  CARDIOMYOPATHY:    We are going to cut back on his Lasix and we talked about salt and fluid restriction.  We talked about using only as much Lasix as needed for volume management and that this should be less of an issue now that his ejection fraction is improved.  He is been drinking an increased amount of fluid and he understands the plan to reduce this.  ATRIAL FIB: He is going to permanently be in atrial fibrillation.  I do not think there is any advantage to  trying to cardiovert him.  He tolerates anticoagulation.  I am going to apply a 24-hour Holter to make sure he has good rate control.  Current medicines are reviewed at length with the patient today.  The patient does not have concerns regarding medicines.  The following changes have been made:  no change  Labs/ tests ordered today  include: None  Orders Placed This Encounter  Procedures  . HOLTER MONITOR - 24 HOUR     Disposition:   FU with me in six months.     Signed, Minus Breeding, MD  08/22/2017 5:04 PM    [place]

## 2017-08-21 ENCOUNTER — Ambulatory Visit: Payer: BLUE CROSS/BLUE SHIELD | Admitting: Emergency Medicine

## 2017-08-21 ENCOUNTER — Encounter: Payer: Self-pay | Admitting: Emergency Medicine

## 2017-08-21 DIAGNOSIS — J189 Pneumonia, unspecified organism: Secondary | ICD-10-CM | POA: Diagnosis not present

## 2017-08-21 DIAGNOSIS — G4733 Obstructive sleep apnea (adult) (pediatric): Secondary | ICD-10-CM

## 2017-08-21 DIAGNOSIS — R0609 Other forms of dyspnea: Secondary | ICD-10-CM | POA: Diagnosis not present

## 2017-08-21 DIAGNOSIS — R0600 Dyspnea, unspecified: Secondary | ICD-10-CM

## 2017-08-21 NOTE — Assessment & Plan Note (Signed)
Overall stable.  He may have some mild obstruction based on his pulmonary function testing (mixed with restriction).  He uses albuterol as needed, a few times a week.  I do not think we need to start a schedule bronchodilator at this time.  Pending on how his breathing progresses, bronchodilator use progresses, we may decide to repeat pulmonary function testing or try empiric long-acting bronchodilator at some point in the future.  He needs weight loss, exercise.

## 2017-08-21 NOTE — Assessment & Plan Note (Signed)
Good compliance with his CPAP confirmed by his download today and based on his reported history.  He needs to continue this.  He does have a good clinical benefit, less daytime sleepiness, a little more energy.

## 2017-08-21 NOTE — Patient Instructions (Signed)
Please continue to wear your CPAP every night as you have been wearing it. Please continue to keep albuterol available to use 2 puffs if needed for shortness of breath, chest tightness, wheezing. We will not start an every day scheduled inhaled medicine right now.  Depending on how you are doing we may reconsider at some point in the future. Follow with Dr Lamonte Sakai in 6 months or sooner if you have any problems

## 2017-08-21 NOTE — Progress Notes (Signed)
Subjective:    Patient ID: [name], male    DOB: [birth year], 62 y.o.   MRN: [id number]   62 year old man with a history of former tobacco use (30 pk-yrs), known lymphoma that is in remission.  He was admitted to the hospital in March 2019 with ARDS following influenza and newly diagnosed systolic CHF with an EF of approximately 25% and diffuse hypokinesis.  He had a tracheostomy was decannulated March 13.  A sleep study was done on 04/20/2017.  This showed mild obstructive sleep apnea with an AHI of 8/h but with a high AHI during REM sleep. He has albuterol available to use prn, uses it for cough, seems to help him. He has exertional SOB, even w walking. Some improvement since the hospital, but still noticeable. Some cough, no real wheeze but may have some UA noise.  PFT today reviewed by me, show restricted lung volumes but grossly normal spirometry with some evidence for possible restriction based on the FEV1 to FVC ratio.  Suspect there could be some occult obstruction but it would be mild.  His diffusion capacity is normal.  ROV 08/21/17 --this follow-up visit for patient with history of tobacco use, ARDS and acute respiratory failure 03/2017, newly diagnosed systolic CHF, and dyspnea on exertion. Pulm function testing is largely restricted.  We started him on CPAP based on moderate obstructive sleep apnea noted on polysomnogram.  He returns today with a compliance report of greater than 4 hours of usage 77% of the night.  He settings are in AutoSet mode 5 to 20 cm water. Nasal pillows, fits well. He is not napping. He feels that his breathing is stable. He uses albuterol a few times a week. He has some am cough. Occasional wheeze. He was treated for CAP again in July by Dr Raul Del office, abx, for non-productive cough.    Review of Systems  Past Medical History:  Diagnosis Date  . Acute systolic HF (heart failure) ([place])   . Atrial fibrillation ([place])   . Diabetes mellitus, new onset ([place])     . Dyslipidemia   . Gout attack 01/16/2012  . Hypertension   . NHL dx'd 01/2010  . Venous insufficiency   . Venous stasis ulcers ([facility])      Family History  Problem Relation Age of Onset  . Colon cancer Neg Hx   . Pancreatic cancer Neg Hx   . Stomach cancer Neg Hx   . Rectal cancer Neg Hx      Social History   Socioeconomic History  . Marital status: Divorced    Spouse name: Not on file  . Number of children: Not on file  . Years of education: Not on file  . Highest education level: Not on file  Occupational History  . Not on file  Social Needs  . Financial resource strain: Not on file  . Food insecurity:    Worry: Not on file    Inability: Not on file  . Transportation needs:    Medical: Not on file    Non-medical: Not on file  Tobacco Use  . Smoking status: Former Smoker    Packs/day: 0.50    Years: 20.00    Pack years: 10.00    Last attempt to quit: 01/10/2009    Years since quitting: 8.6  . Smokeless tobacco: Never Used  Substance and Sexual Activity  . Alcohol use: No  . Drug use: No  . Sexual activity: Not on file  Lifestyle  .  Physical activity:    Days per week: Not on file    Minutes per session: Not on file  . Stress: Not on file  Relationships  . Social connections:    Talks on phone: Not on file    Gets together: Not on file    Attends religious service: Not on file    Active member of club or organization: Not on file    Attends meetings of clubs or organizations: Not on file    Relationship status: Not on file  . Intimate partner violence:    Fear of current or ex partner: Not on file    Emotionally abused: Not on file    Physically abused: Not on file    Forced sexual activity: Not on file  Other Topics Concern  . Not on file  Social History Narrative  . Not on file     Allergies  Allergen Reactions  . Latex Hives and Swelling  . Penicillins Swelling    Age 62 or 62 - pt states he had swelling all over and his lips got real big      Outpatient Medications Prior to Visit  Medication Sig Dispense Refill  . acetaminophen (TYLENOL) 325 MG tablet Take 650 mg by mouth every 6 (six) hours as needed.    [name] Kitchen albuterol (PROVENTIL HFA;VENTOLIN HFA) 108 (90 Base) MCG/ACT inhaler Inhale 2 puffs into the lungs every 6 (six) hours as needed for wheezing or shortness of breath. 1 Inhaler 2  . apixaban (ELIQUIS) 5 MG TABS tablet Take 1 tablet (5 mg total) by mouth 2 (two) times daily. 180 tablet 3  . atorvastatin (LIPITOR) 20 MG tablet TAKE 1 TABLET BY MOUTH EVERY DAY 30 tablet 2  . carvedilol (COREG) 25 MG tablet TAKE 1 TABLET (25 MG TOTAL) BY MOUTH 2 (TWO) TIMES DAILY WITH A MEAL. 60 tablet 2  . febuxostat (ULORIC) 40 MG tablet Take 1 tablet (40 mg total) by mouth daily. 30 tablet 0  . furosemide (LASIX) 40 MG tablet TAKE 1 AND 1/2 TABLETS BY MOUTH 2 TIMES DAILY 90 tablet 2  . Melatonin 3 MG TABS Take 2 tablets (6 mg total) by mouth at bedtime. 60 tablet 0  . Multiple Vitamin (MULTIVITAMIN WITH MINERALS) TABS tablet Take 1 tablet by mouth daily.    . pantoprazole (PROTONIX) 40 MG tablet TAKE 1 TABLET BY MOUTH EVERY DAY 30 tablet 2  . potassium chloride SA (KLOR-CON M20) 20 MEQ tablet Take 2 tablets (40 mEq total) by mouth 2 (two) times daily. 180 tablet 3  . Probiotic Product (PROBIOTIC DAILY PO) Take by mouth.    . sertraline (ZOLOFT) 50 MG tablet Take 1 tablet (50 mg total) by mouth daily. 30 tablet 0  . sodium chloride (OCEAN) 0.65 % SOLN nasal spray Place 2 sprays into both nostrils as needed for congestion.  0  . vitamin C (VITAMIN C) 500 MG tablet Take 1 tablet (500 mg total) by mouth 3 (three) times daily.    [name] Kitchen losartan (COZAAR) 25 MG tablet TAKE 1 TABLET BY MOUTH EVERY DAY (Patient not taking: Reported on 08/21/2017) 30 tablet 2  . TURMERIC PO Take by mouth.     Facility-Administered Medications Prior to Visit  Medication Dose Route Frequency Provider Last Rate Last Dose  . sodium chloride 0.9 % injection 10 mL  10 mL  Intravenous PRN Curt Bears, MD   10 mL at 05/04/15 1529        Objective:   Physical Exam  Vitals:   08/21/17 1518  BP: 120/80  Pulse: 78  SpO2: 98%  Weight: (!) 328 lb 12.8 oz (149.1 kg)  Height: 5\' 11"  (1.803 m)   Gen: Pleasant, obese man, in no distress,  normal affect  ENT: No lesions,  mouth clear,  oropharynx clear, no postnasal drip  Neck: No JVD, no stridor, old well healed stoma scar  Lungs: No use of accessory muscles, clear without rales or rhonchi  Cardiovascular: RRR, heart sounds normal, no murmur or gallops, no peripheral edema  Musculoskeletal: No deformities, no cyanosis or clubbing  Neuro: alert, non focal  Skin: Warm, no lesions or rash      Assessment & Plan:  Dyspnea on exertion Overall stable.  He may have some mild obstruction based on his pulmonary function testing (mixed with restriction).  He uses albuterol as needed, a few times a week.  I do not think we need to start a schedule bronchodilator at this time.  Pending on how his breathing progresses, bronchodilator use progresses, we may decide to repeat pulmonary function testing or try empiric long-acting bronchodilator at some point in the future.  He needs weight loss, exercise.  OSA (obstructive sleep apnea) Good compliance with his CPAP confirmed by his download today and based on his reported history.  He needs to continue this.  He does have a good clinical benefit, less daytime sleepiness, a little more energy.  Baltazar Apo, MD, PhD 08/21/2017, 4:00 PM [facility] [phone] or if no answer [phone]

## 2017-08-22 ENCOUNTER — Encounter: Payer: Self-pay | Admitting: Cardiology

## 2017-08-22 ENCOUNTER — Ambulatory Visit: Payer: BLUE CROSS/BLUE SHIELD | Admitting: Cardiology

## 2017-08-22 VITALS — BP 124/74 | HR 90 | Ht 71.0 in | Wt 330.6 lb

## 2017-08-22 DIAGNOSIS — I482 Chronic atrial fibrillation, unspecified: Secondary | ICD-10-CM

## 2017-08-22 DIAGNOSIS — I420 Dilated cardiomyopathy: Secondary | ICD-10-CM | POA: Diagnosis not present

## 2017-08-22 DIAGNOSIS — I429 Cardiomyopathy, unspecified: Secondary | ICD-10-CM

## 2017-08-22 NOTE — Patient Instructions (Signed)
Medication Instructions:  Continue current medications  If you need a refill on your cardiac medications before your next appointment, please call your pharmacy.  Labwork: None Ordered   Testing/Procedures: Your physician has recommended that you wear a holter monitor 24 hours. Holter monitors are medical devices that record the heart's electrical activity. Doctors most often use these monitors to diagnose arrhythmias. Arrhythmias are problems with the speed or rhythm of the heartbeat. The monitor is a small, portable device. You can wear one while you do your normal daily activities. This is usually used to diagnose what is causing palpitations/syncope (passing out).   Follow-Up: Your physician wants you to follow-up in: 6 Months You should receive a reminder letter in the mail two months in advance. If you do not receive a letter, please call our office to schedule your follow-up appointment.    Thank you for choosing [facility]!!

## 2017-08-24 ENCOUNTER — Encounter: Payer: Self-pay | Admitting: Physical Therapy

## 2017-08-24 ENCOUNTER — Encounter: Payer: BLUE CROSS/BLUE SHIELD | Admitting: Physical Therapy

## 2017-08-24 ENCOUNTER — Ambulatory Visit: Payer: BLUE CROSS/BLUE SHIELD | Attending: Physical Medicine & Rehabilitation | Admitting: Physical Therapy

## 2017-08-24 DIAGNOSIS — M6281 Muscle weakness (generalized): Secondary | ICD-10-CM | POA: Diagnosis not present

## 2017-08-24 DIAGNOSIS — R262 Difficulty in walking, not elsewhere classified: Secondary | ICD-10-CM | POA: Diagnosis not present

## 2017-08-24 NOTE — Therapy (Signed)
[facility] [address] [place], [place], [address] Phone: [phone]   Fax:  [phone]  Physical Therapy Treatment/Recertification/Discharge Summary   Patient Details  Name: [name] MRN: [id number] Date of Birth: February 12, 1955 Referring Provider: Dr Naaman Plummer   Encounter Date: 08/24/2017  PT End of Session - 08/24/17 1543    Visit Number  12    Number of Visits  12    Date for PT Re-Evaluation  08/24/17    Authorization Type  30 visit limit;  had 12 visits HHPT, 6 visits HHOT = 18 visits;  leaves 12 visits for outpatient PT     Authorization - Number of Visits  12    PT Start Time  1533       Past Medical History:  Diagnosis Date  . Acute systolic HF (heart failure) ([place])   . Atrial fibrillation ([place])   . Diabetes mellitus, new onset ([place])   . Dyslipidemia   . Gout attack 01/16/2012  . Hypertension   . NHL dx'd 01/2010  . Venous insufficiency   . Venous stasis ulcers (H[facility])     Past Surgical History:  Procedure Laterality Date  . BRONCHOSCOPY    . ESOPHAGOGASTRODUODENOSCOPY ENDOSCOPY     Dr Jearld Fenton  . INGUINAL HERNIA REPAIR     2001  . PORTA CATH INSERTION  2011    There were no vitals filed for this visit.  Subjective Assessment - 08/24/17 1524    Subjective  Saw the pulmonary doctor and the cardiologist and I don't have to go back for 6 months.  The humidity is not good for me.  My balance seems good.  My hip is still a problem b/c it's weak.   I start [employer] next week with a one on one meeting.       How long can you stand comfortably?  15-20 minutes    How long can you walk comfortably?  10 minutes with the cane     Patient Stated Goals  walk with the cane full time or nothing at all; get breathing better with activity     Currently in Pain?  No/denies    Pain Score  0-No pain    Pain Location  Hip    Pain Orientation  Left    Pain Type  Chronic pain    Aggravating Factors   sit to stand   6-7/10;  getting up to go to the bathroom         [facility] PT Assessment - 08/24/17 0001      Strength   Right Hip Flexion  4+/5    Right Hip Extension  4+/5    Right Hip ABduction  4+/5    Left Hip Flexion  4+/5    Left Hip Extension  4/5    Left Hip ABduction  4+/5    Right Knee Flexion  4+/5    Right Knee Extension  4+/5    Left Knee Flexion  4+/5    Left Knee Extension  4+/5      6 minute walk test results    Aerobic Endurance Distance Walked  560   2 rest breaks secondary to shortness of breath     Standardized Balance Assessment   Five times sit to stand comments   13.9 sec min use of UEs      [employer] comment:  50/56      Timed Up and Go Test  Normal TUG (seconds)  12.3   hip and knee pain today                   [facility] Adult PT Treatment/Exercise - 08/24/17 0001      Knee/Hip Exercises: Aerobic   Nustep  Seat 13, arms 11, 12 min L3   while discussing progress with PT     Knee/Hip Exercises: Machines for Strengthening   Cybex Knee Flexion  20# bil 20x; single leg 10# 20x     Cybex Leg Press  70# bil 15x; 50# 15x single leg      Knee/Hip Exercises: Standing   Other Standing Knee Exercises  review of standing hip exercises with band      Knee/Hip Exercises: Supine   Bridges  Strengthening;Both;10 reps    Bridges with Clamshell  Strengthening;Right;Left;10 reps    Single Leg Bridge  Strengthening;Right;Left;5 reps               PT Short Term Goals - 08/24/17 1712      PT SHORT TERM GOAL #1   Title  The patient will be able to ambulate 500 feet with walker needed for increased community ambulation for grocery shopping, return to work, travel    Status  Achieved      PT [place] #2   Title  The patient will have improved gait speed with timed up and Go test to 14 sec     Status  Achieved      PT SHORT TERM GOAL #3   Title  BERG Balance test score improved to 45/56 indicating improved balance and stability      Status  Achieved      PT SHORT TERM GOAL #4   Title  The patient will have improved LE strength to grossly 4/5 needed for 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    Status  Achieved        PT Long Term Goals - 08/24/17 1549      PT LONG TERM GOAL #1   Title  The patient will independent in safe self progression of HEP    Status  Achieved      PT LONG TERM GOAL #2   Status  Achieved      PT LONG TERM GOAL #3   Title  The patient will have LE strength to grossly 4+/5 needed to rise from a standard chair with minimal UE use     Status  Achieved      PT LONG TERM GOAL #4   Title  BERG balance score improved to 47/56 indicating improved balance and decreased risk for falls    Status  Achieved      PT LONG TERM GOAL #5   Title  Timed up and Go gait speed improved to 13 sec indicating improved  strength     Status  Achieved            Plan - 08/24/17 1708    Clinical Impression Statement  The patient has made excellent gains in LE strength.  Grossly 4+/5 except for left hip extension and abduction 4/5.  He is able to walk community distances with a single point cane now rather than his walker.  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  His BERG balance test indicates he is at min risk for falls.  He has met all  short term and long term goals.  He is ready for discharge from PT to a HEP.  We have discussed a safe self progression of this program and he plans to get one on one personal training at [facility] starting next week.  We have discussed some of the equipment that might be most beneficial.          Patient will benefit from skilled therapeutic intervention in order to improve the following deficits and impairments:     Visit Diagnosis: Muscle weakness (generalized) - Plan: PT plan of care cert/re-cert  Difficulty in walking, not elsewhere classified - Plan: PT plan of care  cert/re-cert    PHYSICAL THERAPY DISCHARGE SUMMARY  Visits from Start of Care: 12  Current functional level related to goals / functional outcomes: See clinical impressions above   Remaining deficits: As above   Education / Equipment: HEP  Plan: Patient agrees to discharge.  Patient goals were met. Patient is being discharged due to meeting the stated rehab goals.  ?????          Problem List Patient Active Problem List   Diagnosis Date Noted  . Dyspnea on exertion 06/19/2017  . Chronic anticoagulation 05/22/2017  . Cardiomyopathy ([place]) 05/22/2017  . Hypotension 05/22/2017  . RBBB 05/22/2017  . UTI due to extended-spectrum beta lactamase (ESBL) producing Escherichia coli 04/06/2017  . Debility   . Wound infection   . OSA (obstructive sleep apnea)   . Acute systolic heart failure ([place])   . Morbid obesity ([place])   . Diabetes mellitus, new onset ([place])   . Dysuria   . Acute respiratory distress syndrome (ARDS) (H[facility])   . Influenza A with pneumonia   . New onset atrial fibrillation ([place])   . Anxiety   . Current moderate episode of major depressive disorder without prior episode ([place])   . Obesity, Class III, BMI 40-49.9 (morbid obesity) ([place])   . Community acquired pneumonia   . Critical illness myopathy   . Dysphagia   . Non-Hodgkin's lymphoma ([place])   . Benign essential HTN   . Leukocytosis   . Hypokalemia   . Tracheostomy status ([place])   . Hypoxic   . Pressure injury of skin 02/28/2017  . Status post tracheostomy ([place])   . Follicular lymphoma grade II of intrathoracic lymph nodes ([place]) 05/02/2016  . Portacath in place 05/04/2015  . Gout attack 01/16/2012  . Follicular lymphoma grade II ([place]) 12/16/2010   Ruben Im, PT 08/24/17 5:15 PM Phone: [phone] Fax: [phone] Alvera Singh 08/24/2017, 5:13 PM  [facility] [address], [place], [place], [address] Phone: [phone]   Fax:   [phone]  Name: ANSON PEDDIE MRN: [id number] Date of Birth: 1955-10-14

## 2017-08-26 ENCOUNTER — Other Ambulatory Visit: Payer: Self-pay | Admitting: Physical Medicine & Rehabilitation

## 2017-08-26 DIAGNOSIS — C8210 Follicular lymphoma grade II, unspecified site: Secondary | ICD-10-CM

## 2017-08-29 ENCOUNTER — Encounter: Payer: BLUE CROSS/BLUE SHIELD | Admitting: Physical Therapy

## 2017-08-31 ENCOUNTER — Other Ambulatory Visit: Payer: Self-pay | Admitting: Physical Medicine & Rehabilitation

## 2017-09-02 DIAGNOSIS — G4733 Obstructive sleep apnea (adult) (pediatric): Secondary | ICD-10-CM | POA: Diagnosis not present

## 2017-09-04 ENCOUNTER — Ambulatory Visit (INDEPENDENT_AMBULATORY_CARE_PROVIDER_SITE_OTHER): Payer: BLUE CROSS/BLUE SHIELD

## 2017-09-04 DIAGNOSIS — I482 Chronic atrial fibrillation, unspecified: Secondary | ICD-10-CM

## 2017-09-18 DIAGNOSIS — J189 Pneumonia, unspecified organism: Secondary | ICD-10-CM | POA: Diagnosis not present

## 2017-10-13 DIAGNOSIS — R05 Cough: Secondary | ICD-10-CM | POA: Diagnosis not present

## 2017-10-13 DIAGNOSIS — J069 Acute upper respiratory infection, unspecified: Secondary | ICD-10-CM | POA: Diagnosis not present

## 2017-10-16 DIAGNOSIS — E7849 Other hyperlipidemia: Secondary | ICD-10-CM | POA: Diagnosis not present

## 2017-10-16 DIAGNOSIS — R82998 Other abnormal findings in urine: Secondary | ICD-10-CM | POA: Diagnosis not present

## 2017-10-16 DIAGNOSIS — R7301 Impaired fasting glucose: Secondary | ICD-10-CM | POA: Diagnosis not present

## 2017-10-16 DIAGNOSIS — M109 Gout, unspecified: Secondary | ICD-10-CM | POA: Diagnosis not present

## 2017-10-16 DIAGNOSIS — I10 Essential (primary) hypertension: Secondary | ICD-10-CM | POA: Diagnosis not present

## 2017-10-16 DIAGNOSIS — Z125 Encounter for screening for malignant neoplasm of prostate: Secondary | ICD-10-CM | POA: Diagnosis not present

## 2017-10-16 DIAGNOSIS — Z0000 Encounter for general adult medical examination without abnormal findings: Secondary | ICD-10-CM | POA: Diagnosis not present

## 2017-10-30 DIAGNOSIS — D6859 Other primary thrombophilia: Secondary | ICD-10-CM | POA: Insufficient documentation

## 2017-10-30 DIAGNOSIS — E7849 Other hyperlipidemia: Secondary | ICD-10-CM | POA: Diagnosis not present

## 2017-10-30 DIAGNOSIS — Z23 Encounter for immunization: Secondary | ICD-10-CM | POA: Diagnosis not present

## 2017-10-30 DIAGNOSIS — I2510 Atherosclerotic heart disease of native coronary artery without angina pectoris: Secondary | ICD-10-CM | POA: Diagnosis not present

## 2017-10-30 DIAGNOSIS — I10 Essential (primary) hypertension: Secondary | ICD-10-CM | POA: Diagnosis not present

## 2017-10-30 DIAGNOSIS — Z0000 Encounter for general adult medical examination without abnormal findings: Secondary | ICD-10-CM | POA: Diagnosis not present

## 2017-10-30 DIAGNOSIS — Z1389 Encounter for screening for other disorder: Secondary | ICD-10-CM | POA: Diagnosis not present

## 2017-10-30 DIAGNOSIS — Z125 Encounter for screening for malignant neoplasm of prostate: Secondary | ICD-10-CM | POA: Diagnosis not present

## 2017-10-30 DIAGNOSIS — R7301 Impaired fasting glucose: Secondary | ICD-10-CM | POA: Diagnosis not present

## 2017-11-08 ENCOUNTER — Telehealth: Payer: Self-pay | Admitting: Internal Medicine

## 2017-11-08 ENCOUNTER — Encounter: Payer: Self-pay | Admitting: Internal Medicine

## 2017-11-08 NOTE — Telephone Encounter (Signed)
Pts BMI is 46.13 and per OV note with cardiology in august EF is back to normal. As long as not on oxygen should be good for L[facility], please schedule.

## 2017-11-24 ENCOUNTER — Other Ambulatory Visit: Payer: Self-pay | Admitting: Physical Medicine & Rehabilitation

## 2017-11-24 DIAGNOSIS — C8210 Follicular lymphoma grade II, unspecified site: Secondary | ICD-10-CM

## 2017-11-29 ENCOUNTER — Telehealth: Payer: Self-pay | Admitting: Internal Medicine

## 2017-11-29 NOTE — Telephone Encounter (Signed)
Scheduled appt per 11/19 sch message - pt is aware of appt date and time  

## 2017-12-09 ENCOUNTER — Other Ambulatory Visit: Payer: Self-pay | Admitting: Physical Medicine & Rehabilitation

## 2017-12-11 ENCOUNTER — Telehealth: Payer: Self-pay

## 2017-12-11 ENCOUNTER — Ambulatory Visit: Payer: BLUE CROSS/BLUE SHIELD | Admitting: Gastroenterology

## 2017-12-11 ENCOUNTER — Encounter: Payer: Self-pay | Admitting: Gastroenterology

## 2017-12-11 VITALS — BP 138/84 | HR 97 | Ht 71.0 in | Wt 332.0 lb

## 2017-12-11 DIAGNOSIS — Z860100 Personal history of colon polyps, unspecified: Secondary | ICD-10-CM | POA: Insufficient documentation

## 2017-12-11 DIAGNOSIS — Z7901 Long term (current) use of anticoagulants: Secondary | ICD-10-CM | POA: Diagnosis not present

## 2017-12-11 DIAGNOSIS — I10 Essential (primary) hypertension: Secondary | ICD-10-CM | POA: Diagnosis not present

## 2017-12-11 MED ORDER — NA SULFATE-K SULFATE-MG SULF 17.5-3.13-1.6 GM/177ML PO SOLN: ORAL | 0 refills | Status: DC

## 2017-12-11 NOTE — Telephone Encounter (Signed)
[facility] Pre-operative Risk Assessment     Request for surgical clearance:     Endoscopy Procedure  What type of surgery is being performed?     Colonoscopy  When is this surgery scheduled?     12/25/17  What type of clearance is required ?   Pharmacy  Are there any medications that need to be held prior to surgery and how long? HOLD ELIQUIS for 2 days prior  Practice name and name of physician performing surgery?      [facility]/Dr. Hilarie Fredrickson  What is your office phone and fax number?      Phone- [phone]  [phone]  Anesthesia type (None, local, MAC, general) ?       MAC

## 2017-12-11 NOTE — Patient Instructions (Signed)
If you are age 62 or older, your body mass index should be between 23-30. Your Body mass index is 46.3 kg/m. If this is out of the aforementioned range listed, please consider follow up with your Primary Care Provider.  If you are age 58 or younger, your body mass index should be between 19-25. Your Body mass index is 46.3 kg/m. If this is out of the aformentioned range listed, please consider follow up with your Primary Care Provider.   You have been scheduled for a colonoscopy. Please follow written instructions given to you at your visit today.  Please pick up your prep supplies at the pharmacy within the next 1-3 days. If you use inhalers (even only as needed), please bring them with you on the day of your procedure. Your physician has requested that you go to www.startemmi.com and enter the access code given to you at your visit today. This web site gives a general overview about your procedure. However, you should still follow specific instructions given to you by our office regarding your preparation for the procedure.  We have sent the following medications to your pharmacy for you to pick up at your convenience: Geraldine will be contacted by our office prior to your procedure for directions on holding your Eliquis.  If you do not hear from our office 1 week prior to your scheduled procedure, please call [phone] to discuss.   Thank you for choosing me and [facility].   Alonza Bogus, PA-C

## 2017-12-11 NOTE — Progress Notes (Addendum)
12/11/2017 [name] [id number] 30-Mar-1955   HISTORY OF PRESENT ILLNESS: This is a 62 year old male who is a patient of Dr. Vena Rua.  He underwent colonoscopy in October 2015 at which time he was found to have 4 polyps removed, which were tubular adenomas on pathology.  Also had diverticulosis.  It was recommended that he have a repeat colonoscopy at a 3-year interval.  Is here today to schedule that procedure.  He tells me that in the beginning of this year he was hospitalized for pneumonia and diagnosed with atrial fibrillation.  He is now on Eliquis twice daily, which is prescribed by Dr. Percival Spanish.  Most recent EF was 50-55%.  Has no GI issues or complaints.   Past Medical History:  Diagnosis Date  . Acute systolic HF (heart failure) ([place])   . Atrial fibrillation ([place])   . Diabetes mellitus, new onset ([place])   . Dyslipidemia   . Gout attack 01/16/2012  . Hypertension   . NHL dx'd 01/2010  . Venous insufficiency   . Venous stasis ulcers (H[facility])    Past Surgical History:  Procedure Laterality Date  . BRONCHOSCOPY    . ESOPHAGOGASTRODUODENOSCOPY ENDOSCOPY     Dr Jearld Fenton  . INGUINAL HERNIA REPAIR     2001  . PORTA CATH INSERTION  2011    reports that he quit smoking about 8 years ago. He has a 10.00 pack-year smoking history. He has never used smokeless tobacco. He reports that he does not drink alcohol or use drugs. family history includes Dementia in his brother. Allergies  Allergen Reactions  . Latex Hives and Swelling  . Penicillins Swelling    Age 75 or 39 - pt states he had swelling all over and his lips got real big      Outpatient Encounter Medications as of 12/11/2017  Medication Sig  . acetaminophen (TYLENOL) 325 MG tablet Take 650 mg by mouth every 6 (six) hours as needed.  [name] Kitchen albuterol (PROVENTIL HFA;VENTOLIN HFA) 108 (90 Base) MCG/ACT inhaler Inhale 2 puffs into the lungs every 6 (six) hours as needed for wheezing or shortness of breath.  [name] Kitchen apixaban (ELIQUIS) 5  MG TABS tablet Take 1 tablet (5 mg total) by mouth 2 (two) times daily.  [name] Kitchen atorvastatin (LIPITOR) 20 MG tablet TAKE 1 TABLET BY MOUTH EVERY DAY  . carvedilol (COREG) 25 MG tablet TAKE 1 TABLET (25 MG TOTAL) BY MOUTH 2 (TWO) TIMES DAILY WITH A MEAL.  . febuxostat (ULORIC) 40 MG tablet Take 1 tablet (40 mg total) by mouth daily.  . furosemide (LASIX) 40 MG tablet TAKE 1 AND 1/2 TABLETS BY MOUTH 2 TIMES DAILY  . losartan (COZAAR) 25 MG tablet TAKE 1 TABLET BY MOUTH EVERY DAY  . Melatonin 3 MG TABS Take 2 tablets (6 mg total) by mouth at bedtime.  . Multiple Vitamin (MULTIVITAMIN WITH MINERALS) TABS tablet Take 1 tablet by mouth daily.  . pantoprazole (PROTONIX) 40 MG tablet TAKE 1 TABLET BY MOUTH EVERY DAY  . potassium chloride SA (KLOR-CON M20) 20 MEQ tablet Take 2 tablets (40 mEq total) by mouth 2 (two) times daily.  . Probiotic Product (PROBIOTIC DAILY PO) Take by mouth.  . sertraline (ZOLOFT) 50 MG tablet Take 1 tablet (50 mg total) by mouth daily.  . sodium chloride (OCEAN) 0.65 % SOLN nasal spray Place 2 sprays into both nostrils as needed for congestion.  . vitamin C (VITAMIN C) 500 MG tablet Take 1 tablet (500 mg total) by mouth  3 (three) times daily.   Facility-Administered Encounter Medications as of 12/11/2017  Medication  . sodium chloride 0.9 % injection 10 mL     REVIEW OF SYSTEMS  : All other systems reviewed and negative except where noted in the History of Present Illness.   PHYSICAL EXAM: BP 138/84   Pulse 97   Ht 5\' 11"  (1.803 m)   Wt (!) 332 lb (150.6 kg)   BMI 46.30 kg/m  General: Well developed white male in no acute distress Head: Normocephalic and atraumatic Eyes:  Sclerae anicteric, conjunctiva pink. Ears: Normal auditory acuity Lungs: Clear throughout to auscultation; no increased WOB. Heart: Irregularly irregular Abdomen: Soft, non-distended.  BS present.  Non-tender. Rectal:  Will be done at the time of colonoscopy. Musculoskeletal: Symmetrical with no  gross deformities  Skin: No lesions on visible extremities Extremities: No edema  Neurological: Alert oriented x 4, grossly non-focal Psychological:  Alert and cooperative. Normal mood and affect  ASSESSMENT AND PLAN: *Personal history of colon polyps:  Adenomatous polyps removed in 10/2013 with recommended recall in 3 years.  Will schedule with Dr. Hilarie Fredrickson. *Chronic anticoagulation with Eliquis for atrial fibrillation:  Will hold Eliquis for 2 days prior to endoscopic procedures - will instruct when and how to resume after procedure. Benefits and risks of procedure explained including risks of bleeding, perforation, infection, missed lesions, reactions to medications and possible need for hospitalization and surgery for complications. Additional rare but real risk of stroke or other vascular clotting events off of  also explained and need to seek urgent help if any signs of these problems occur. Will communicate by phone or EMR with patient's  prescribing provider, Dr. Percival Spanish, to confirm that holding Eliquis is reasonable in this case.    CC:  [name], MD   Addendum: Reviewed and agree with assessment and management plan. Pyrtle, Lajuan Lines, MD

## 2017-12-12 NOTE — Telephone Encounter (Signed)
Routing to pharmacy.  Leaman Abe C. Dorothia Passmore, RN, ANP-C [facility] [address] [place], [place]  [address] [phone]  

## 2017-12-12 NOTE — Telephone Encounter (Signed)
Pt takes Eliquis for afib with CHADS2VASc score of 3 (CHF, HTN, DM). Renal function is normal.  Ok to hold Eliquis for 2 days prior to procedure. 

## 2017-12-13 ENCOUNTER — Telehealth: Payer: Self-pay

## 2017-12-13 NOTE — Telephone Encounter (Signed)
Per Fuller Canada, Pharmacist its okay for patient to hold Eliquis two days prior to procedure.  Pt advised. Pt. Verbalized understanding.

## 2017-12-18 ENCOUNTER — Telehealth: Payer: Self-pay | Admitting: Internal Medicine

## 2017-12-18 NOTE — Telephone Encounter (Signed)
Keep as is.

## 2017-12-21 DIAGNOSIS — G4733 Obstructive sleep apnea (adult) (pediatric): Secondary | ICD-10-CM | POA: Diagnosis not present

## 2017-12-22 ENCOUNTER — Telehealth: Payer: Self-pay

## 2017-12-22 NOTE — Telephone Encounter (Signed)
Already ordered

## 2017-12-25 ENCOUNTER — Encounter: Payer: Self-pay | Admitting: Internal Medicine

## 2017-12-25 ENCOUNTER — Ambulatory Visit (AMBULATORY_SURGERY_CENTER): Payer: BLUE CROSS/BLUE SHIELD | Admitting: Internal Medicine

## 2017-12-25 VITALS — BP 120/80 | HR 72 | Temp 97.5°F | Resp 14 | Ht 71.0 in | Wt 332.0 lb

## 2017-12-25 DIAGNOSIS — D123 Benign neoplasm of transverse colon: Secondary | ICD-10-CM

## 2017-12-25 DIAGNOSIS — Z86010 Personal history of colonic polyps: Secondary | ICD-10-CM

## 2017-12-25 DIAGNOSIS — K635 Polyp of colon: Secondary | ICD-10-CM | POA: Diagnosis not present

## 2017-12-25 DIAGNOSIS — D122 Benign neoplasm of ascending colon: Secondary | ICD-10-CM

## 2017-12-25 DIAGNOSIS — D125 Benign neoplasm of sigmoid colon: Secondary | ICD-10-CM

## 2017-12-25 DIAGNOSIS — D124 Benign neoplasm of descending colon: Secondary | ICD-10-CM | POA: Diagnosis not present

## 2017-12-25 DIAGNOSIS — Z1211 Encounter for screening for malignant neoplasm of colon: Secondary | ICD-10-CM | POA: Diagnosis not present

## 2017-12-25 MED ORDER — SODIUM CHLORIDE 0.9 % IV SOLN: 500.0000 mL | Freq: Once | INTRAVENOUS | Status: DC

## 2017-12-25 NOTE — Progress Notes (Signed)
Called to room to assist during endoscopic procedure.  Patient ID and intended procedure confirmed with present staff. Received instructions for my participation in the procedure from the performing physician.  

## 2017-12-25 NOTE — Progress Notes (Signed)
Alert and oriented x3, pleased with MAC, report to RN  

## 2017-12-25 NOTE — Progress Notes (Signed)
Pt's states no medical or surgical changes since previsit or office visit. 

## 2017-12-25 NOTE — Patient Instructions (Signed)
Handouts Provided:  Polyps  RESUME Eliquis at prior dose in 2 days.   YOU HAD AN ENDOSCOPIC PROCEDURE TODAY AT [facility]:   Refer to the procedure report that was given to you for any specific questions about what was found during the examination.  If the procedure report does not answer your questions, please call your gastroenterologist to clarify.  If you requested that your care partner not be given the details of your procedure findings, then the procedure report has been included in a sealed envelope for you to review at your convenience later.  YOU SHOULD EXPECT: Some feelings of bloating in the abdomen. Passage of more gas than usual.  Walking can help get rid of the air that was put into your GI tract during the procedure and reduce the bloating. If you had a lower endoscopy (such as a colonoscopy or flexible sigmoidoscopy) you may notice spotting of blood in your stool or on the toilet paper. If you underwent a bowel prep for your procedure, you may not have a normal bowel movement for a few days.  Please Note:  You might notice some irritation and congestion in your nose or some drainage.  This is from the oxygen used during your procedure.  There is no need for concern and it should clear up in a day or so.  SYMPTOMS TO REPORT IMMEDIATELY:   Following lower endoscopy (colonoscopy or flexible sigmoidoscopy):  Excessive amounts of blood in the stool  Significant tenderness or worsening of abdominal pains  Swelling of the abdomen that is new, acute  Fever of 100F or higher  For urgent or emergent issues, a gastroenterologist can be reached at any hour by calling [phone].   DIET:  We do recommend a small meal at first, but then you may proceed to your regular diet.  Drink plenty of fluids but you should avoid alcoholic beverages for 24 hours.  ACTIVITY:  You should plan to take it easy for the rest of today and you should NOT DRIVE or use heavy machinery  until tomorrow (because of the sedation medicines used during the test).    FOLLOW UP: Our staff will call the number listed on your records the next business day following your procedure to check on you and address any questions or concerns that you may have regarding the information given to you following your procedure. If we do not reach you, we will leave a message.  However, if you are feeling well and you are not experiencing any problems, there is no need to return our call.  We will assume that you have returned to your regular daily activities without incident.  If any biopsies were taken you will be contacted by phone or by letter within the next 1-3 weeks.  Please call us at [phone] if you have not heard about the biopsies in 3 weeks.    SIGNATURES/CONFIDENTIALITY: You and/or your care partner have signed paperwork which will be entered into your electronic medical record.  These signatures attest to the fact that that the information above on your After Visit Summary has been reviewed and is understood.  Full responsibility of the confidentiality of this discharge information lies with you and/or your care-partner.

## 2017-12-25 NOTE — Op Note (Signed)
[facility] Patient Name: [name] Procedure Date: 12/25/2017 1:46 PM MRN: [id number] Endoscopist: Jerene Bears , MD Age: 62 Referring MD:  Date of Birth: [birth year] Gender: Male Account #: [id number] Procedure:                Colonoscopy Indications:              Surveillance: Personal history of adenomatous                            polyps on last colonoscopy 3 years ago Medicines:                Monitored Anesthesia Care Procedure:                Pre-Anesthesia Assessment:                           - Prior to the procedure, a History and Physical                            was performed, and patient medications and                            allergies were reviewed. The patient's tolerance of                            previous anesthesia was also reviewed. The risks                            and benefits of the procedure and the sedation                            options and risks were discussed with the patient.                            All questions were answered, and informed consent                            was obtained. Prior Anticoagulants: The patient has                            taken Eliquis (apixaban), last dose was 2 days                            prior to procedure. After reviewing the risks and                            benefits, the patient was deemed in satisfactory                            condition to undergo the procedure.                           After obtaining informed consent, the colonoscope  was passed under direct vision. Throughout the                            procedure, the patient's blood pressure, pulse, and                            oxygen saturations were monitored continuously. The                            Colonoscope was introduced through the anus and                            advanced to the colocolonic anastomosis created                            status post left colectomy. The  colonoscopy was                            performed without difficulty. The patient tolerated                            the procedure well. The quality of the bowel                            preparation was good after copious irrigation and                            lavage (right colon and flexures). The ileocecal                            valve, appendiceal orifice, and rectum were                            photographed. Scope In: 1:57:57 PM Scope Out: 2:32:11 PM Scope Withdrawal Time: 0 hours 28 minutes 12 seconds  Total Procedure Duration: 0 hours 34 minutes 14 seconds  Findings:                 The digital rectal exam was normal.                           A 5 mm polyp was found in the ascending colon. The                            polyp was sessile. The polyp was removed with a                            cold snare. Resection and retrieval were complete.                           Two sessile polyps were found in the hepatic                            flexure. The polyps were 3 to 7 mm  in size. These                            polyps were removed with a cold snare. Resection                            and retrieval were complete.                           Three sessile polyps were found in the transverse                            colon. The polyps were 4 to 7 mm in size. These                            polyps were removed with a cold snare. Resection                            and retrieval were complete.                           A 14 mm polyp was found in the transverse colon.                            The polyp was semi-pedunculated. The polyp was                            removed with a hot snare. Resection and retrieval                            were complete.                           Three sessile polyps were found in the splenic                            flexure. The polyps were 5 to 9 mm in size. These                            polyps were removed with a cold  snare. Resection                            and retrieval were complete.                           A 5 mm polyp was found in the descending colon. The                            polyp was sessile. The polyp was removed with a                            cold snare. Resection and retrieval were complete.  A 5 mm polyp was found in the sigmoid colon. The                            polyp was sessile. The polyp was removed with a                            cold snare. Resection and retrieval were complete.                           Multiple small and large-mouthed diverticula were                            found from ascending colon to sigmoid colon.                           Internal hemorrhoids were found during                            retroflexion. The hemorrhoids were small. Complications:            No immediate complications. Estimated Blood Loss:     Estimated blood loss was minimal. Impression:               - One 5 mm polyp in the ascending colon, removed                            with a cold snare. Resected and retrieved.                           - Two 3 to 7 mm polyps at the hepatic flexure,                            removed with a cold snare. Resected and retrieved.                           - Three 4 to 7 mm polyps in the transverse colon,                            removed with a cold snare. Resected and retrieved.                           - One 14 mm polyp in the transverse colon, removed                            with a hot snare. Resected and retrieved.                           - Three 5 to 9 mm polyps at the splenic flexure,                            removed with a cold snare. Resected and retrieved.                           -  One 5 mm polyp in the descending colon, removed                            with a cold snare. Resected and retrieved.                           - One 5 mm polyp in the sigmoid colon, removed with                             a cold snare. Resected and retrieved.                           - Diverticulosis from ascending colon to sigmoid                            colon.                           - Small internal hemorrhoids. Recommendation:           - Patient has a contact number available for                            emergencies. The signs and symptoms of potential                            delayed complications were discussed with the                            patient. Return to normal activities tomorrow.                            Written discharge instructions were provided to the                            patient.                           - Resume previous diet.                           - Continue present medications.                           - Await pathology results.                           - Repeat colonoscopy is recommended for                            surveillance. The colonoscopy date will be                            determined after pathology results from today's  exam become available for review.                           - Resume Eliquis (apixaban) at prior dose in 2                            days. Refer to managing physician for further                            adjustment of therapy. Jerene Bears, MD 12/25/2017 2:40:26 PM This report has been signed electronically.

## 2017-12-26 ENCOUNTER — Telehealth: Payer: Self-pay

## 2017-12-26 NOTE — Telephone Encounter (Signed)
  Follow up Call-  Call back number 12/25/2017  Post procedure Call Back phone  # [phone]  Permission to leave phone message Yes  Some recent data might be hidden     Patient questions:  Do you have a fever, pain , or abdominal swelling? No. Pain Score  0 *  Have you tolerated food without any problems? Yes.    Have you been able to return to your normal activities? Yes.    Do you have any questions about your discharge instructions: Diet   No. Medications  No. Follow up visit  No.  Do you have questions or concerns about your Care? No.  Actions: * If pain score is 4 or above: No action needed, pain <4.

## 2018-01-01 ENCOUNTER — Encounter: Payer: Self-pay | Admitting: Internal Medicine

## 2018-01-05 ENCOUNTER — Other Ambulatory Visit: Payer: Self-pay | Admitting: Internal Medicine

## 2018-01-05 DIAGNOSIS — C8212 Follicular lymphoma grade II, intrathoracic lymph nodes: Secondary | ICD-10-CM

## 2018-01-08 ENCOUNTER — Inpatient Hospital Stay: Payer: BLUE CROSS/BLUE SHIELD

## 2018-01-08 ENCOUNTER — Inpatient Hospital Stay: Payer: BLUE CROSS/BLUE SHIELD | Attending: Internal Medicine | Admitting: Internal Medicine

## 2018-01-08 ENCOUNTER — Other Ambulatory Visit: Payer: Self-pay

## 2018-01-08 ENCOUNTER — Encounter: Payer: Self-pay | Admitting: Internal Medicine

## 2018-01-08 ENCOUNTER — Telehealth: Payer: Self-pay | Admitting: Internal Medicine

## 2018-01-08 VITALS — BP 138/83 | HR 80 | Temp 98.0°F | Resp 18 | Ht 71.0 in | Wt 338.5 lb

## 2018-01-08 DIAGNOSIS — Z79899 Other long term (current) drug therapy: Secondary | ICD-10-CM | POA: Insufficient documentation

## 2018-01-08 DIAGNOSIS — Z7901 Long term (current) use of anticoagulants: Secondary | ICD-10-CM | POA: Diagnosis not present

## 2018-01-08 DIAGNOSIS — I10 Essential (primary) hypertension: Secondary | ICD-10-CM | POA: Insufficient documentation

## 2018-01-08 DIAGNOSIS — I4891 Unspecified atrial fibrillation: Secondary | ICD-10-CM | POA: Insufficient documentation

## 2018-01-08 DIAGNOSIS — C8212 Follicular lymphoma grade II, intrathoracic lymph nodes: Secondary | ICD-10-CM

## 2018-01-08 DIAGNOSIS — Z9221 Personal history of antineoplastic chemotherapy: Secondary | ICD-10-CM | POA: Insufficient documentation

## 2018-01-08 DIAGNOSIS — Z95828 Presence of other vascular implants and grafts: Secondary | ICD-10-CM | POA: Insufficient documentation

## 2018-01-08 LAB — CBC WITH DIFFERENTIAL (CANCER CENTER ONLY)
Abs Immature Granulocytes: 0.07 10*3/uL (ref 0.00–0.07)
Basophils Absolute: 0.1 10*3/uL (ref 0.0–0.1)
Basophils Relative: 1 %
Eosinophils Absolute: 0.3 10*3/uL (ref 0.0–0.5)
Eosinophils Relative: 3 %
HCT: 42.3 % (ref 39.0–52.0)
Hemoglobin: 13.4 g/dL (ref 13.0–17.0)
Immature Granulocytes: 1 %
Lymphocytes Relative: 25 %
Lymphs Abs: 2.2 10*3/uL (ref 0.7–4.0)
MCH: 28.2 pg (ref 26.0–34.0)
MCHC: 31.7 g/dL (ref 30.0–36.0)
MCV: 89.1 fL (ref 80.0–100.0)
MONOS PCT: 8 %
Monocytes Absolute: 0.7 10*3/uL (ref 0.1–1.0)
Neutro Abs: 5.5 10*3/uL (ref 1.7–7.7)
Neutrophils Relative %: 62 %
Platelet Count: 207 10*3/uL (ref 150–400)
RBC: 4.75 MIL/uL (ref 4.22–5.81)
RDW: 15.2 % (ref 11.5–15.5)
WBC Count: 8.9 10*3/uL (ref 4.0–10.5)
nRBC: 0 % (ref 0.0–0.2)

## 2018-01-08 LAB — CMP (CANCER CENTER ONLY)
ALT: 21 U/L (ref 0–44)
AST: 22 U/L (ref 15–41)
Albumin: 3.5 g/dL (ref 3.5–5.0)
Alkaline Phosphatase: 139 U/L — ABNORMAL HIGH (ref 38–126)
Anion gap: 8 (ref 5–15)
BUN: 16 mg/dL (ref 8–23)
CHLORIDE: 107 mmol/L (ref 98–111)
CO2: 24 mmol/L (ref 22–32)
Calcium: 8.9 mg/dL (ref 8.9–10.3)
Creatinine: 0.84 mg/dL (ref 0.61–1.24)
GFR, Est AFR Am: >60 mL/min (ref >60)
GFR, Estimated: >60 mL/min (ref >60)
GLUCOSE: 109 mg/dL — AB (ref 70–99)
Potassium: 4.2 mmol/L (ref 3.5–5.1)
Sodium: 139 mmol/L (ref 135–145)
Total Bilirubin: 0.5 mg/dL (ref 0.3–1.2)
Total Protein: 7.1 g/dL (ref 6.5–8.1)

## 2018-01-08 LAB — LACTATE DEHYDROGENASE: LDH: 162 U/L (ref 98–192)

## 2018-01-08 MED ORDER — HEPARIN SOD (PORK) LOCK FLUSH 100 UNIT/ML IV SOLN
500.0000 [IU] | Freq: Once | INTRAVENOUS | Status: AC | PRN
  Administered 2018-01-08: 500 [IU] via INTRAVENOUS
  Filled 2018-01-08: qty 5

## 2018-01-08 MED ORDER — SODIUM CHLORIDE 0.9% FLUSH
10.0000 mL | Freq: Once | INTRAVENOUS | Status: AC
  Administered 2018-01-08: 10 mL
  Filled 2018-01-08: qty 10

## 2018-01-08 NOTE — Progress Notes (Signed)
[facility] Telephone:([phone]) [phone]   Fax:(336) [phone]  OFFICE PROGRESS NOTE  [name], MD [place] [address]  DIAGNOSIS: : Stage III follicular lymphoma, grade 2, diagnosed in June 2012   PRIOR THERAPY:  1) Systemic chemotherapy with CHOP/Rituxan, status post 8 cycles, last dose was given 07/19/2010.  2)  Maintenance therapy with Rituxan 375 mg/m2 given every 2 months for a total of 2 years, status post 12 cycles.  CURRENT THERAPY: Observation.  INTERVAL HISTORY: [name] 62 y.o. male returns to the clinic today for follow-up visit.  The patient is feeling very well today with no concerning complaints except for mild fatigue.  He was admitted to [facility] in March 2019 with pneumonia as well as congestive heart failure.  He spent almost 2 months in the hospital at that time.  He is recovering well and feeling much better.  He denied having any current chest pain but continues to have shortness of breath with exertion with no cough or hemoptysis.  He denied having any fever or chills.  He intentionally lost a lot of weight over the last few months.  He is here today for evaluation and repeat blood work.  MEDICAL HISTORY: Past Medical History:  Diagnosis Date  . Acute systolic HF (heart failure) ([facility])   . Atrial fibrillation ([facility])   . Diabetes mellitus, new onset ([facility])   . Dyslipidemia   . Gout attack 01/16/2012  . Hypertension   . NHL dx'd 01/2010  . Venous insufficiency   . Venous stasis ulcers ([facility])     ALLERGIES:  is allergic to latex and penicillins.  MEDICATIONS:  Current Outpatient Medications  Medication Sig Dispense Refill  . acetaminophen (TYLENOL) 325 MG tablet Take 650 mg by mouth every 6 (six) hours as needed.    [name] Kitchen albuterol (PROVENTIL HFA;VENTOLIN HFA) 108 (90 Base) MCG/ACT inhaler Inhale 2 puffs into the lungs every 6 (six) hours as needed for wheezing or shortness of breath. 1 Inhaler 2  . apixaban  (ELIQUIS) 5 MG TABS tablet Take 1 tablet (5 mg total) by mouth 2 (two) times daily. 180 tablet 3  . atorvastatin (LIPITOR) 20 MG tablet TAKE 1 TABLET BY MOUTH EVERY DAY 90 tablet 0  . carvedilol (COREG) 25 MG tablet TAKE 1 TABLET (25 MG TOTAL) BY MOUTH 2 (TWO) TIMES DAILY WITH A MEAL. 60 tablet 2  . febuxostat (ULORIC) 40 MG tablet Take 1 tablet (40 mg total) by mouth daily. 30 tablet 0  . furosemide (LASIX) 40 MG tablet TAKE 1 AND 1/2 TABLETS BY MOUTH 2 TIMES DAILY 90 tablet 2  . losartan (COZAAR) 25 MG tablet TAKE 1 TABLET BY MOUTH EVERY DAY 30 tablet 2  . Melatonin 3 MG TABS Take 2 tablets (6 mg total) by mouth at bedtime. 60 tablet 0  . Multiple Vitamin (MULTIVITAMIN WITH MINERALS) TABS tablet Take 1 tablet by mouth daily.    . potassium chloride SA (KLOR-CON M20) 20 MEQ tablet Take 2 tablets (40 mEq total) by mouth 2 (two) times daily. 180 tablet 3  . Probiotic Product (PROBIOTIC DAILY PO) Take by mouth.    . sertraline (ZOLOFT) 50 MG tablet Take 1 tablet (50 mg total) by mouth daily. 30 tablet 0  . sodium chloride (OCEAN) 0.65 % SOLN nasal spray Place 2 sprays into both nostrils as needed for congestion.  0  . vitamin C (VITAMIN C) 500 MG tablet Take 1 tablet (500 mg  total) by mouth 3 (three) times daily.     No current facility-administered medications for this visit.    Facility-Administered Medications Ordered in Other Visits  Medication Dose Route Frequency Provider Last Rate Last Dose  . sodium chloride 0.9 % injection 10 mL  10 mL Intravenous PRN Curt Bears, MD   10 mL at 05/04/15 1529    REVIEW OF SYSTEMS:  A comprehensive review of systems was negative except for: Respiratory: positive for dyspnea on exertion   PHYSICAL EXAMINATION: General appearance: alert, cooperative and no distress Head: Normocephalic, without obvious abnormality, atraumatic Neck: no adenopathy Lymph nodes: Cervical, supraclavicular, and axillary nodes normal. Resp: clear to auscultation  bilaterally Back: symmetric, no curvature. ROM normal. No CVA tenderness. Cardio: regular rate and rhythm, S1, S2 normal, no murmur, click, rub or gallop GI: soft, non-tender; bowel sounds normal; no masses,  no organomegaly Extremities: extremities normal, atraumatic, no cyanosis or edema  ECOG PERFORMANCE STATUS: 0 - Asymptomatic  Blood pressure 138/83, pulse 80, temperature 98 F (36.7 C), temperature source Oral, resp. rate 18, height 5\' 11"  (1.803 m), weight (!) 338 lb 8 oz (153.5 kg), SpO2 99 %.  LABORATORY DATA: Lab Results  Component Value Date   WBC 8.9 01/08/2018   HGB 13.4 01/08/2018   HCT 42.3 01/08/2018   MCV 89.1 01/08/2018   PLT 207 01/08/2018      Chemistry      Component Value Date/Time   NA 137 05/22/2017 1550   NA 137 10/24/2016 1440   K 4.5 05/22/2017 1550   K 3.6 10/24/2016 1440   CL 97 05/22/2017 1550   CL 105 05/14/2012 0840   CO2 20 05/22/2017 1550   CO2 26 10/24/2016 1440   BUN 12 05/22/2017 1550   BUN 16.2 10/24/2016 1440   CREATININE 1.02 05/22/2017 1550   CREATININE 0.8 10/24/2016 1440      Component Value Date/Time   CALCIUM 9.1 05/22/2017 1550   CALCIUM 9.2 10/24/2016 1440   ALKPHOS 177 (H) 03/23/2017 0429   ALKPHOS 131 10/24/2016 1440   AST 25 03/23/2017 0429   AST 31 10/24/2016 1440   ALT 38 03/23/2017 0429   ALT 31 10/24/2016 1440   BILITOT 1.4 (H) 03/23/2017 0429   BILITOT 0.61 10/24/2016 1440      RADIOLOGY STUDIES: No results found. ASSESSMENT AND PLAN:  This is a very pleasant 62 years old white male with a stage IIIa follicular lymphoma status post treatment with CHOP/Rituxan followed by maintenance Rituxan for 2 years and has been observation for more than 4 years now. The patient is doing fine today with no concerning complaints. Repeat CBC today is unremarkable for any abnormality. Comprehensive metabolic panel and LDH are still pending. I recommended for the patient to continue on observation with repeat CBC,  comprehensive metabolic panel and LDH in 1 year. He was advised to call immediately if he has any concerning symptoms in the interval. All questions were answered. The patient knows to call the clinic with any problems, questions or concerns. We can certainly see the patient much sooner if necessary.  Disclaimer: This note was dictated with voice recognition software. Similar sounding words can inadvertently be transcribed and may not be corrected upon review.

## 2018-01-08 NOTE — Telephone Encounter (Signed)
Printed calendar and avs. °

## 2018-01-21 DIAGNOSIS — G4733 Obstructive sleep apnea (adult) (pediatric): Secondary | ICD-10-CM | POA: Diagnosis not present

## 2018-02-12 DIAGNOSIS — R05 Cough: Secondary | ICD-10-CM | POA: Diagnosis not present

## 2018-02-12 DIAGNOSIS — Z6841 Body Mass Index (BMI) 40.0 and over, adult: Secondary | ICD-10-CM | POA: Diagnosis not present

## 2018-02-12 DIAGNOSIS — J181 Lobar pneumonia, unspecified organism: Secondary | ICD-10-CM | POA: Diagnosis not present

## 2018-02-12 DIAGNOSIS — I10 Essential (primary) hypertension: Secondary | ICD-10-CM | POA: Diagnosis not present

## 2018-02-21 DIAGNOSIS — G4733 Obstructive sleep apnea (adult) (pediatric): Secondary | ICD-10-CM | POA: Diagnosis not present

## 2018-02-23 ENCOUNTER — Telehealth: Payer: Self-pay | Admitting: Emergency Medicine

## 2018-02-23 NOTE — Telephone Encounter (Signed)
Called and spoke with patient, he stated that he was placed in the hospital during the months of February and March of 2019. During that time he was under care of Dr. Nelda Marseille. Patient stated that he was given a pulmonary diagnosis and advised by Dr. Nelda Marseille that he would not be able to fly. Patient had already purchased plane tickets prior to February 2019 for him and his family to go to [place] in May of 2019. Patient is trying to get reimbursed for those tickets however he is unable to without a letter stating that he was not able to fly at that time. Patient is requesting that Dr. Lamonte Sakai write a detailed letter about his diagnosis and why he was unable to fly last year. Below I have copied the AVS instructions from the last OV with Dr. Lamonte Sakai.  Instructions   Please continue to wear your CPAP every night as you have been wearing it. Please continue to keep albuterol available to use 2 puffs if needed for shortness of breath, chest tightness, wheezing. We will not start an every day scheduled inhaled medicine right now.  Depending on how you are doing we may reconsider at some point in the future. Follow with Dr Lamonte Sakai in 6 months or sooner if you have any problems      _____________________________________________________________________________________  RB please advise, thank you.

## 2018-02-26 ENCOUNTER — Other Ambulatory Visit (HOSPITAL_COMMUNITY): Payer: Self-pay

## 2018-02-26 ENCOUNTER — Encounter: Payer: Self-pay | Admitting: Emergency Medicine

## 2018-02-26 ENCOUNTER — Ambulatory Visit: Payer: BLUE CROSS/BLUE SHIELD | Admitting: Primary Care

## 2018-02-26 VITALS — BP 144/74 | HR 84 | Ht 71.0 in | Wt 347.8 lb

## 2018-02-26 DIAGNOSIS — G4733 Obstructive sleep apnea (adult) (pediatric): Secondary | ICD-10-CM | POA: Diagnosis not present

## 2018-02-26 DIAGNOSIS — J189 Pneumonia, unspecified organism: Secondary | ICD-10-CM | POA: Diagnosis not present

## 2018-02-26 DIAGNOSIS — R1310 Dysphagia, unspecified: Secondary | ICD-10-CM

## 2018-02-26 NOTE — Progress Notes (Signed)
@Patient  ID: [name], male    DOB: [birth year], 62 y.o.   MRN: [id number]  Chief Complaint  Patient presents with  . Follow-up    pt had pneumonia 2 weeks ago, went to doctor and recieved antibiotic; states he has his usual DOE, no cough, denies chest tightness/pain; on CPAP, no issues w/ pressure settings    Referring provider: [name], MD  HPI: 62 year old male, former smoker (30 pack years). PMH significant for OSA, CHF, follicular lymphoma. Patient of Dr. Lamonte Sakai, last seen on 08/2017. Patient was hospitalized in March 2019 with ARDS following influenza. He had a tracheostomy and was decannulated on March 13th. Treated again for CAP in July by Dr. Manya Silvas office. Sleep study in April 2019 showed mild OSA with AHI 8/hr but with a high AHI during REM. PFTs showed restrictive lung volumes but grossly normal spirometry.   02/26/2018 Patient presents today for follow-up visit. He feels well, no acute complaints. Diagnosed with right sided pneumonia 2 weeks ago and treated with Levaquin, states that he had a chest xray that confirmed this with his PCP. Reports that he has completely returned to his normal health state. Continues to have baseline dyspnea on exertion. He has started going to the gym three days week since November. Works in a Human resources officer, occasionally wears a mask. Does not feel that he aspirates, no coughing after eating. Plans to have repeat CXR in 2 weeks. Denies fever, diaphoresis, cough or sob at rest.    Allergies  Allergen Reactions  . Latex Hives and Swelling  . Penicillins Swelling    Age 23 or 61 - pt states he had swelling all over and his lips got real big    Immunization History  Administered Date(s) Administered  . Influenza-Unspecified 10/26/2017  . Pneumococcal Polysaccharide-23 03/23/2017    Past Medical History:  Diagnosis Date  . Acute systolic HF (heart failure) ([place])   . Atrial fibrillation ([place])   . Diabetes mellitus, new onset ([place])   .  Dyslipidemia   . Gout attack 01/16/2012  . Hypertension   . NHL dx'd 01/2010  . Venous insufficiency   . Venous stasis ulcers ([facility])     Tobacco History: Social History   Tobacco Use  Smoking Status Former Smoker  . Packs/day: 0.50  . Years: 20.00  . Pack years: 10.00  . Last attempt to quit: 01/10/2009  . Years since quitting: 9.1  Smokeless Tobacco Never Used   Counseling given: Not Answered   Outpatient Medications Prior to Visit  Medication Sig Dispense Refill  . acetaminophen (TYLENOL) 325 MG tablet Take 650 mg by mouth every 6 (six) hours as needed.    [name] Kitchen albuterol (PROVENTIL HFA;VENTOLIN HFA) 108 (90 Base) MCG/ACT inhaler Inhale 2 puffs into the lungs every 6 (six) hours as needed for wheezing or shortness of breath. 1 Inhaler 2  . apixaban (ELIQUIS) 5 MG TABS tablet Take 1 tablet (5 mg total) by mouth 2 (two) times daily. 180 tablet 3  . atorvastatin (LIPITOR) 20 MG tablet TAKE 1 TABLET BY MOUTH EVERY DAY 90 tablet 0  . carvedilol (COREG) 25 MG tablet TAKE 1 TABLET (25 MG TOTAL) BY MOUTH 2 (TWO) TIMES DAILY WITH A MEAL. 60 tablet 2  . furosemide (LASIX) 40 MG tablet TAKE 1 AND 1/2 TABLETS BY MOUTH 2 TIMES DAILY 90 tablet 2  . losartan (COZAAR) 25 MG tablet TAKE 1 TABLET BY MOUTH EVERY DAY 30 tablet 2  . Melatonin 3 MG  TABS Take 2 tablets (6 mg total) by mouth at bedtime. 60 tablet 0  . Multiple Vitamin (MULTIVITAMIN WITH MINERALS) TABS tablet Take 1 tablet by mouth daily.    . potassium chloride SA (KLOR-CON M20) 20 MEQ tablet Take 2 tablets (40 mEq total) by mouth 2 (two) times daily. 180 tablet 3  . Probiotic Product (PROBIOTIC DAILY PO) Take by mouth.    . sertraline (ZOLOFT) 50 MG tablet Take 1 tablet (50 mg total) by mouth daily. 30 tablet 0  . sodium chloride (OCEAN) 0.65 % SOLN nasal spray Place 2 sprays into both nostrils as needed for congestion.  0  . vitamin C (VITAMIN C) 500 MG tablet Take 1 tablet (500 mg total) by mouth 3 (three) times daily.    . febuxostat  (ULORIC) 40 MG tablet Take 1 tablet (40 mg total) by mouth daily. (Patient not taking: Reported on 02/26/2018) 30 tablet 0   Facility-Administered Medications Prior to Visit  Medication Dose Route Frequency Provider Last Rate Last Dose  . sodium chloride 0.9 % injection 10 mL  10 mL Intravenous PRN Curt Bears, MD   10 mL at 05/04/15 1529    Review of Systems  Review of Systems  Constitutional: Negative.  Negative for chills, diaphoresis, fatigue and fever.  HENT: Negative.   Respiratory: Negative for cough, shortness of breath and wheezing.        DOE  Cardiovascular: Negative.     Physical Exam  BP (!) 144/74 (BP Location: Left Wrist, Cuff Size: Normal)   Pulse 84   Ht 5\' 11"  (1.803 m)   Wt (!) 347 lb 12.8 oz (157.8 kg)   SpO2 97%   BMI 48.51 kg/m  Physical Exam Constitutional:      Appearance: Normal appearance. He is obese. He is not ill-appearing.  HENT:     Head: Normocephalic and atraumatic.     Right Ear: Tympanic membrane normal.     Left Ear: Tympanic membrane normal.     Mouth/Throat:     Mouth: Mucous membranes are moist.     Pharynx: Oropharynx is clear.  Cardiovascular:     Rate and Rhythm: Normal rate. Rhythm irregular.  Pulmonary:     Effort: Pulmonary effort is normal.     Breath sounds: No wheezing or rhonchi.     Comments: CTA Musculoskeletal: Normal range of motion.  Skin:    General: Skin is warm and dry.  Neurological:     General: No focal deficit present.     Mental Status: He is alert and oriented to person, place, and time. Mental status is at baseline.  Psychiatric:        Mood and Affect: Mood normal.        Behavior: Behavior normal.        Thought Content: Thought content normal.        Judgment: Judgment normal.      Lab Results:  CBC    Component Value Date/Time   WBC 8.9 01/08/2018 1500   WBC 15.6 (H) 04/05/2017 0744   RBC 4.75 01/08/2018 1500   HGB 13.4 01/08/2018 1500   HGB 12.4 (L) 05/22/2017 1550   HGB 14.5  10/24/2016 1440   HCT 42.3 01/08/2018 1500   HCT 39.4 05/22/2017 1550   HCT 43.8 10/24/2016 1440   PLT 207 01/08/2018 1500   PLT 262 05/22/2017 1550   MCV 89.1 01/08/2018 1500   MCV 91 05/22/2017 1550   MCV 92.4 10/24/2016 1440   MCH  28.2 01/08/2018 1500   MCHC 31.7 01/08/2018 1500   RDW 15.2 01/08/2018 1500   RDW 17.0 (H) 05/22/2017 1550   RDW 15.2 (H) 10/24/2016 1440   LYMPHSABS 2.2 01/08/2018 1500   LYMPHSABS 1.7 10/24/2016 1440   MONOABS 0.7 01/08/2018 1500   MONOABS 0.9 10/24/2016 1440   EOSABS 0.3 01/08/2018 1500   EOSABS 0.3 10/24/2016 1440   BASOSABS 0.1 01/08/2018 1500   BASOSABS 0.1 10/24/2016 1440    BMET    Component Value Date/Time   NA 139 01/08/2018 1500   NA 137 05/22/2017 1550   NA 137 10/24/2016 1440   K 4.2 01/08/2018 1500   K 3.6 10/24/2016 1440   CL 107 01/08/2018 1500   CL 105 05/14/2012 0840   CO2 24 01/08/2018 1500   CO2 26 10/24/2016 1440   GLUCOSE 109 (H) 01/08/2018 1500   GLUCOSE 94 10/24/2016 1440   GLUCOSE 102 (H) 05/14/2012 0840   BUN 16 01/08/2018 1500   BUN 12 05/22/2017 1550   BUN 16.2 10/24/2016 1440   CREATININE 0.84 01/08/2018 1500   CREATININE 0.8 10/24/2016 1440   CALCIUM 8.9 01/08/2018 1500   CALCIUM 9.2 10/24/2016 1440   GFRNONAA >60 01/08/2018 1500   GFRAA >60 01/08/2018 1500    BNP    Component Value Date/Time   BNP 401.1 (H) 02/22/2017 1100    ProBNP No results found for: PROBNP  Imaging: No results found.   Assessment & Plan:   Recurrent pneumonia - Hx CAP March 2019, July 2019 and Jan 2020  - Radiographic dx right sided pneumonia and treated with Levaquin per PCP recently  - Clinically improved; Lungs clear t/o, afebrile. Absent cough.  - Concern for aspiration. Recommend modified barium swallow, if this is normal then recommend Chest CT w/o contrast  - Needs repeat CXR in 2 weeks to ensure resolution   OSA (obstructive sleep apnea) - Patient is 100% compliant with CPAP use - Used 30/30 days, 93% >4  hours, pressure setting 5-20cm h20, 95th pressure 12.7, AHI 1.6  - No changes to therapy today - Continue CPAP use, goal 4-6 hours + every night - FU in 6 months with Dr. Gigi Gin, NP 02/26/2018

## 2018-02-26 NOTE — Patient Instructions (Addendum)
Get CXR in 2 weeks to ensure resolution of pneumonia   Orders: Recommend Chest CT in 8 weeks d/t recurrent pneumonia (hold off for now) Modified Barium swallow re: recurrent pneumonia   Sleep apnea recommendations: Continue CPAP at current setting Goal wear 4-6 hours every night Do not drive if experiencing excessive fatigue or somnolence  Follow-up: 6 months with Dr. Lamonte Sakai    Sleep Apnea Sleep apnea affects breathing during sleep. It causes breathing to stop for a short time or to become shallow. It can also increase the risk of:  Heart attack.  Stroke.  Being very overweight (obese).  Diabetes.  Heart failure.  Irregular heartbeat. The goal of treatment is to help you breathe normally again. What are the causes? There are three kinds of sleep apnea:  Obstructive sleep apnea. This is caused by a blocked or collapsed airway.  Central sleep apnea. This happens when the brain does not send the right signals to the muscles that control breathing.  Mixed sleep apnea. This is a combination of obstructive and central sleep apnea. The most common cause of this condition is a collapsed or blocked airway. This can happen if:  Your throat muscles are too relaxed.  Your tongue and tonsils are too large.  You are overweight.  Your airway is too small. What increases the risk?  Being overweight.  Smoking.  Having a small airway.  Being older.  Being male.  Drinking alcohol.  Taking medicines to calm yourself (sedatives or tranquilizers).  Having family members with the condition. What are the signs or symptoms?  Trouble staying asleep.  Being sleepy or tired during the day.  Getting angry a lot.  Loud snoring.  Headaches in the morning.  Not being able to focus your mind (concentrate).  Forgetting things.  Less interest in sex.  Mood swings.  Personality changes.  Feelings of sadness (depression).  Waking up a lot during the night to pee  (urinate).  Dry mouth.  Sore throat. How is this diagnosed?  Your medical history.  A physical exam.  A test that is done when you are sleeping (sleep study). The test is most often done in a sleep lab but may also be done at home. How is this treated?   Sleeping on your side.  Using a medicine to get rid of mucus in your nose (decongestant).  Avoiding the use of alcohol, medicines to help you relax, or certain pain medicines (narcotics).  Losing weight, if needed.  Changing your diet.  Not smoking.  Using a machine to open your airway while you sleep, such as: ? An oral appliance. This is a mouthpiece that shifts your lower jaw forward. ? A CPAP device. This device blows air through a mask when you breathe out (exhale). ? An EPAP device. This has valves that you put in each nostril. ? A BPAP device. This device blows air through a mask when you breathe in (inhale) and breathe out.  Having surgery if other treatments do not work. It is important to get treatment for sleep apnea. Without treatment, it can lead to:  High blood pressure.  Coronary artery disease.  In men, not being able to have an erection (impotence).  Reduced thinking ability. Follow these instructions at home: Lifestyle  Make changes that your doctor recommends.  Eat a healthy diet.  Lose weight if needed.  Avoid alcohol, medicines to help you relax, and some pain medicines.  Do not use any products that contain nicotine or  tobacco, such as cigarettes, e-cigarettes, and chewing tobacco. If you need help quitting, ask your doctor. General instructions  Take over-the-counter and prescription medicines only as told by your doctor.  If you were given a machine to use while you sleep, use it only as told by your doctor.  If you are having surgery, make sure to tell your doctor you have sleep apnea. You may need to bring your device with you.  Keep all follow-up visits as told by your doctor.  This is important. Contact a doctor if:  The machine that you were given to use during sleep bothers you or does not seem to be working.  You do not get better.  You get worse. Get help right away if:  Your chest hurts.  You have trouble breathing in enough air.  You have an uncomfortable feeling in your back, arms, or stomach.  You have trouble talking.  One side of your body feels weak.  A part of your face is hanging down. These symptoms may be an emergency. Do not wait to see if the symptoms will go away. Get medical help right away. Call your local emergency services (911 in the [place] Do not drive yourself to the hospital. Summary  This condition affects breathing during sleep.  The most common cause is a collapsed or blocked airway.  The goal of treatment is to help you breathe normally while you sleep. This information is not intended to replace advice given to you by your health care provider. Make sure you discuss any questions you have with your health care provider. Document Released: 10/06/2007 Document Revised: 08/22/2017 Document Reviewed: 08/22/2017 Elsevier Interactive Patient Education  2019 Else[employer]   Aspiration Precautions, Adult Aspiration is the breathing in (inhalation) of a liquid or object into the lungs. Things that can be inhaled into the lungs include:  Food.  Any type of liquid, such as drinks or saliva.  Stomach contents, such as vomit or stomach acid. What are the signs of aspiration? Signs of aspiration include:  Coughing after swallowing food or liquids.  Clearing the throat often while eating.  Trouble breathing. This may include: ? Breathing quickly. ? Breathing very slowly. ? Loud breathing. ? Rumbling sounds from the lungs while breathing.  Coughing up phlegm (sputum) that: ? Is yellow, tan, or green. ? Has pieces of food in it. ? Is bad-smelling.  Having a hoarse, barky cough.  Not being able to speak.  A  hoarse voice.  Drooling while eating.  A feeling of fullness in the throat or a feeling that something is stuck in the throat.  Choking often.  Having a runny noise while eating.  Coughing when lying down or having to sit up quickly after lying down.  A change in skin color. The skin may look red or blue.  Fever.  Watery eyes.  Pain in the chest or back.  A pained look on the face. What are the complications of aspiration? Complications of aspiration include:  Losing weight because the person is not absorbing needed nutrients.  Loss of enjoyment and the social benefits of eating.  Choking.  Lung irritation, if someone aspirates acidic food or drinks.  Lung infection (pneumonia).  Collection of infected liquid (pus) in the lungs (lung abscess). In serious cases, death can occur. What can I do to prevent aspiration? Caring for someone who has a feeding tube If you are caring for someone who has a feeding tube who cannot eat or drink  safely through his or her mouth:  Keep the person in an upright position as much as possible.  Do not lay the person flat if he or she is getting continuous feedings. If you need to lay the person flat for any reason, turn the feeding pump off.  Check feeding tube residuals as told by your health care provider. Ask your health care provider what residual amount is too high. Caring for someone who can eat and drink safely by mouth If you are caring for someone who can eat and drink safely through his or her mouth:  Have the person sit in an upright position when eating food or drinking fluids. This can be done in two ways: ? Have the person sit up in a chair. ? If sitting in a chair is not possible, position the person in bed so he or she is upright.  Remind the person to eat slowly and chew well. Make sure the person is awake and alert while eating.  Do not distract the person. This is especially important for people with thinking or  memory (cognitive) problems.  Allow foods to cool. Hot foods may be more difficult to swallow.  Provide small meals more frequently, instead of 3 large meals. This may reduce fatigue during eating.  Check the person's mouth thoroughly for leftover food after eating.  Keep the person sitting upright for 30-45 minutes after eating.  Do not serve food or drink during 2 hours or more before bedtime. General instructions Follow these general guidelines to prevent aspiration in someone who can eat and drink safely by mouth:  Never put food or liquids in the mouth of a person who is not fully alert.  Feed small amounts of food. Do not force feed.  For a person who is on a diet for swallowing difficulty (dysphagia diet), follow the recommended food and drink consistency. For example, in dysphagia diet level 1, thicken liquids to pudding-like consistency.  Use as little water as possible when brushing the person's teeth or cleaning his or her mouth.  Provide oral care before and after meals.  Use adaptive devices such as cut-out cups, straws, or utensils as told by the health care provider.  Crush pills and put them in soft food such as pudding or ice cream. Some pills should not be crushed. Check with the health care provider before crushing any medicine. Contact a health care provider if:  The person has a feeding tube, and the feeding tube residual amount is too high.  The person has a fever.  The person tries to avoid food or water, such as refusing to eat, drink, or be fed, or is eating less than normal.  The person may have aspirated food or liquid.  You notice warning signs, such as choking or coughing, when the person eats or drinks. Get help right away if:  The person has trouble breathing or starts to breathe quickly.  The person is breathing very slowly or stops breathing.  The person coughs a lot after eating or drinking.  The person has a long-lasting (chronic)  cough.  The person coughs up thick, yellow, or tan sputum.  If someone is choking on food or an object, perform the Heimlich maneuver (abdominal thrusts).  The person has symptoms of pneumonia, such as: ? Coughing a lot. ? Coughing up mucus with a bad smell or blood in it. ? Feeling short of breath. ? Complaining of chest pain. ? Sweating, fever, and chills. ? Feeling  tired. ? Complaining of trouble breathing. ? Wheezing.  The person cannot stop choking.  The person is unable to breathe, turns blue, faints, or seems confused. These symptoms may represent a serious problem that is an emergency. Do not wait to see if the symptoms will go away. Get medical help right away. Call your local emergency services (911 in the [place]  Summary  Aspiration is the breathing in (inhalation) of a liquid or object into the lungs. Things that can be inhaled into the lungs include food, liquids, saliva, or stomach contents.  Aspiration can cause pneumonia or choking.  One sign of aspiration is coughing after swallowing food or liquids.  Contact a health care provider if you notice signs of aspiration. This information is not intended to replace advice given to you by your health care provider. Make sure you discuss any questions you have with your health care provider. Document Released: 01/29/2010 Document Revised: 09/24/2015 Document Reviewed: 09/24/2015 Elsevier Interactive Patient Education  [employer].

## 2018-02-26 NOTE — Assessment & Plan Note (Signed)
-   Patient is 100% compliant with CPAP use - Used 30/30 days, 93% >4 hours, pressure setting 5-20cm h20, 95th pressure 12.7, AHI 1.6  - No changes to therapy today - Continue CPAP use, goal 4-6 hours + every night - FU in 6 months with Dr. Lamonte Sakai

## 2018-02-26 NOTE — Telephone Encounter (Signed)
Message previously routed to Dr. Byrum 

## 2018-02-26 NOTE — Assessment & Plan Note (Addendum)
-   Hx CAP March 2019, July 2019 and Jan 2020  - Radiographic dx right sided pneumonia and treated with Levaquin per PCP recently  - Clinically improved; Lungs clear t/o, afebrile. Absent cough.  - Concern for aspiration. Recommend modified barium swallow, if this is normal then recommend Chest CT w/o contrast  - Needs repeat CXR in 2 weeks to ensure resolution

## 2018-02-27 ENCOUNTER — Telehealth: Payer: Self-pay | Admitting: Primary Care

## 2018-02-27 NOTE — Telephone Encounter (Signed)
Patient is calling to make sure Derl Barrow NP types up the letter regarding flying they talked about at 02/26/18 office visit.

## 2018-02-27 NOTE — Telephone Encounter (Signed)
Message routed to Beth, NP 

## 2018-02-27 NOTE — Telephone Encounter (Signed)
Lattie Haw can you please type up a note that says the following and mail to patient;  Patient was hospitalized in Spring of 2019 for a severe respiratory infection and advised not to fly in May 2019. He was recently seen in office for a follow-up visit and has no current flying restrictions. If you have any further questions or need additional information please do not hesitate to reach out to our office.

## 2018-03-01 NOTE — Telephone Encounter (Signed)
Letter typed and mailed.  Nothing further needed.

## 2018-03-02 NOTE — Telephone Encounter (Signed)
I see that this has been done. Will close note

## 2018-03-04 ENCOUNTER — Other Ambulatory Visit: Payer: Self-pay | Admitting: Cardiology

## 2018-03-04 DIAGNOSIS — C8210 Follicular lymphoma grade II, unspecified site: Secondary | ICD-10-CM

## 2018-03-07 NOTE — Telephone Encounter (Signed)
F/U Message         Patient is returning the call

## 2018-03-12 ENCOUNTER — Telehealth: Payer: Self-pay | Admitting: Cardiology

## 2018-03-12 NOTE — Telephone Encounter (Signed)
Patient aware samples are at the front desk for pick up  

## 2018-03-12 NOTE — Telephone Encounter (Signed)
New Message  Patient calling the office for samples of medication:   1.  What medication and dosage are you requesting samples for? Eliquis 5mg    2.  Are you currently out of this medication? Will be out this week

## 2018-03-19 DIAGNOSIS — J181 Lobar pneumonia, unspecified organism: Secondary | ICD-10-CM | POA: Diagnosis not present

## 2018-03-19 DIAGNOSIS — I480 Paroxysmal atrial fibrillation: Secondary | ICD-10-CM | POA: Diagnosis not present

## 2018-03-19 DIAGNOSIS — I10 Essential (primary) hypertension: Secondary | ICD-10-CM | POA: Diagnosis not present

## 2018-03-19 DIAGNOSIS — R05 Cough: Secondary | ICD-10-CM | POA: Diagnosis not present

## 2018-03-19 DIAGNOSIS — Z7901 Long term (current) use of anticoagulants: Secondary | ICD-10-CM | POA: Diagnosis not present

## 2018-03-22 DIAGNOSIS — G4733 Obstructive sleep apnea (adult) (pediatric): Secondary | ICD-10-CM | POA: Diagnosis not present

## 2018-03-27 ENCOUNTER — Telehealth: Payer: Self-pay | Admitting: Emergency Medicine

## 2018-03-27 NOTE — Telephone Encounter (Signed)
Spoke with patient. He is currently scheduled for a swallow eval at [facility] next week. He wanted to know if it would be safe for him to still have this. Explained to him that the hospitals are very clean, just follow standard precautions. Do not touch his face, wash his hands frequently. He verbalized understanding. Nothing further needed at time of call.

## 2018-03-30 ENCOUNTER — Encounter: Payer: Self-pay | Admitting: Cardiology

## 2018-04-02 ENCOUNTER — Encounter (HOSPITAL_COMMUNITY): Payer: BLUE CROSS/BLUE SHIELD

## 2018-04-02 ENCOUNTER — Other Ambulatory Visit: Payer: Self-pay | Admitting: Cardiology

## 2018-04-02 ENCOUNTER — Ambulatory Visit (HOSPITAL_COMMUNITY): Payer: BLUE CROSS/BLUE SHIELD

## 2018-04-02 DIAGNOSIS — C8210 Follicular lymphoma grade II, unspecified site: Secondary | ICD-10-CM

## 2018-04-09 ENCOUNTER — Telehealth: Payer: Self-pay | Admitting: Cardiology

## 2018-04-09 MED ORDER — APIXABAN 5 MG PO TABS: 5.0000 mg | ORAL_TABLET | Freq: Two times a day (BID) | ORAL | 6 refills | Status: DC

## 2018-04-09 NOTE — Telephone Encounter (Signed)
° ° °  Patient calling the office for samples of medication:   1.  What medication and dosage are you requesting samples for? Eliquis  2.  Are you currently out of this medication? 4 days remaining

## 2018-04-09 NOTE — Telephone Encounter (Signed)
Spoke to patient office out of Eliquis samples.Prescription sent to pharmacy.

## 2018-04-10 ENCOUNTER — Encounter: Payer: Self-pay | Admitting: Cardiology

## 2018-04-10 ENCOUNTER — Telehealth (INDEPENDENT_AMBULATORY_CARE_PROVIDER_SITE_OTHER): Payer: BLUE CROSS/BLUE SHIELD | Admitting: Cardiology

## 2018-04-10 VITALS — BP 135/81 | HR 71 | Ht 71.0 in | Wt 340.0 lb

## 2018-04-10 DIAGNOSIS — I429 Cardiomyopathy, unspecified: Secondary | ICD-10-CM

## 2018-04-10 DIAGNOSIS — I482 Chronic atrial fibrillation, unspecified: Secondary | ICD-10-CM | POA: Diagnosis not present

## 2018-04-10 MED ORDER — FUROSEMIDE 20 MG PO TABS: 20.0000 mg | ORAL_TABLET | ORAL | 6 refills | Status: DC | PRN

## 2018-04-10 NOTE — Progress Notes (Signed)
Virtual Visit via Telephone Note    Evaluation Performed:  Follow-up visit  This visit type was conducted due to national recommendations for restrictions regarding the COVID-19 Pandemic (e.g. social distancing).  This format is felt to be most appropriate for this patient at this time.  All issues noted in this document were discussed and addressed.  No physical exam was performed (except for noted visual exam findings with Video Visits).  Please refer to the patient's chart (MyChart message for video visits and phone note for telephone visits) for the patient's consent to telehealth for [facility].  Date:  04/10/2018   ID:  [name], DOB 03-Jul-1955, MRN [id number]  Patient Location:  Patient's Home  Provider location:   [place], [place]  PCP:  [name], MD  Cardiologist:  Minus Breeding, MD  Electrophysiologist:  None   Chief Complaint:  DOE  History of Present Illness:    [name] is a 63 y.o. male who presents via audio/video conferencing for a telehealth visit today.    The patient presents for evaluation of cardiomyopathy.  He was in the hospital earlier last year with respiratory failure related to ARDS/influenza A. He was found to have an EF of 25% which was new and new onset atrial fib. He had a protracted hospital stay and went to rehab afterward. He had a follow up echo in July and his EF had improved back to normal.  He is being followed by Dr. Lamonte Sakai for sleep apnea.  He reports that he did have recurrent respiratory problems I last saw him and is to get a swallowing study for possible aspiration.  Prior to [place] he was doing a regular exercise program and was feeling well.  He does have SOB climbing up a flight of stairs but this is baseline.  He has had no PND or orthopnea.  He has had no weight gain or edema.    The patient does not have symptoms concerning for COVID-19 infection (fever, chills, cough, or new shortness of breath).    Prior CV  studies:   The following studies were reviewed today:  Echo, labs, pulmonary and oncology records  Past Medical History:  Diagnosis Date  . Acute systolic HF (heart failure) ([place])   . Atrial fibrillation ([place])   . Diabetes mellitus, new onset ([place])   . Dyslipidemia   . Gout attack 01/16/2012  . Hypertension   . Lymphoma ([place])   . NHL dx'd 01/2010  . Venous insufficiency   . Venous stasis ulcers ([facility])    Past Surgical History:  Procedure Laterality Date  . BRONCHOSCOPY    . ESOPHAGOGASTRODUODENOSCOPY ENDOSCOPY     Dr Jearld Fenton  . INGUINAL HERNIA REPAIR     2001  . PORTA CATH INSERTION  2011     Current Meds  Medication Sig  . acetaminophen (TYLENOL) 325 MG tablet Take 650 mg by mouth every 6 (six) hours as needed.  [name] Kitchen albuterol (PROVENTIL HFA;VENTOLIN HFA) 108 (90 Base) MCG/ACT inhaler Inhale 2 puffs into the lungs every 6 (six) hours as needed for wheezing or shortness of breath.  [name] Kitchen apixaban (ELIQUIS) 5 MG TABS tablet Take 1 tablet (5 mg total) by mouth 2 (two) times daily.  [name] Kitchen atorvastatin (LIPITOR) 20 MG tablet TAKE 1 TABLET BY MOUTH EVERY DAY *PT OVERDUE FOR OFFICE VISIT*  . carvedilol (COREG) 25 MG tablet TAKE 1 TABLET (25 MG TOTAL) BY MOUTH 2 (TWO) TIMES DAILY WITH A MEAL.  [name] Kitchen losartan (COZAAR) 25  MG tablet TAKE 1 TABLET BY MOUTH EVERY DAY  . Melatonin 3 MG TABS Take 1 tablet by mouth at bedtime.  . Multiple Vitamin (MULTIVITAMIN WITH MINERALS) TABS tablet Take 1 tablet by mouth daily.  . Probiotic Product (PROBIOTIC DAILY PO) Take by mouth.  . sertraline (ZOLOFT) 50 MG tablet Take 1 tablet (50 mg total) by mouth daily.  . sodium chloride (OCEAN) 0.65 % SOLN nasal spray Place 2 sprays into both nostrils as needed for congestion.  . vitamin C (VITAMIN C) 500 MG tablet Take 1 tablet (500 mg total) by mouth 3 (three) times daily.  . [DISCONTINUED] pantoprazole (PROTONIX) 40 MG tablet TAKE 1 TABLET BY MOUTH EVERY DAY     Allergies:   Latex and Penicillins   Social History    Tobacco Use  . Smoking status: Former Smoker    Packs/day: 0.50    Years: 20.00    Pack years: 10.00    Last attempt to quit: 01/10/2009    Years since quitting: 9.2  . Smokeless tobacco: Never Used  Substance Use Topics  . Alcohol use: No  . Drug use: No     Family Hx: The patient's family history includes Dementia in his brother. There is no history of Colon cancer, Pancreatic cancer, Stomach cancer, Rectal cancer, or Liver cancer.  ROS:   Please see the history of present illness.    As stated in the HPI and negative for all other systems.    Labs/Other Tests and Data Reviewed:    Recent Labs: 01/08/2018: ALT 21; BUN 16; Creatinine 0.84; Hemoglobin 13.4; Platelet Count 207; Potassium 4.2; Sodium 139   Recent Lipid Panel Lab Results  Component Value Date/Time   TRIG 122 02/26/2017 01:06 PM    Wt Readings from Last 3 Encounters:  04/10/18 (!) 340 lb (154.2 kg)  02/26/18 (!) 347 lb 12.8 oz (157.8 kg)  01/08/18 (!) 338 lb 8 oz (153.5 kg)     Objective:    Vital Signs:  BP 135/81   Pulse 71   Ht 5\' 11"  (1.803 m)   Wt (!) 340 lb (154.2 kg)   SpO2 97%   BMI 47.42 kg/m      ASSESSMENT & PLAN:    CARDIOMYOPATHY:     He seems to be doing relatively well.  I think it sounds like his weights are doing well.  At this point I am not can make any change to his medications except, call in some as needed Lasix.  ATRIAL FIB: He is in permanent atrial fib.  No change in therapy.   COVID-19 Education: The signs and symptoms of COVID-19 were discussed with the patient and how to seek care for testing (follow up with PCP or arrange E-visit).  The importance of social distancing was discussed today.  Patient Risk:   After full review of this patient's clinical status, I feel that they are at least moderate risk at this time.  Time:   Today, I have spent 15  minutes with the patient with telehealth technology discussing the above and Covid.     Medication  Adjustments/Labs and Tests Ordered: Current medicines are reviewed at length with the patient today.  Concerns regarding medicines are outlined above.  Tests Ordered: No orders of the defined types were placed in this encounter.  Medication Changes: No orders of the defined types were placed in this encounter.   Disposition:  Follow up in 6 month(s)  Signed, Minus Breeding, MD  04/10/2018 11:25 AM  [facility]

## 2018-04-10 NOTE — Patient Instructions (Signed)
Medication Instructions:  START- Furosemide 20 mg as needed for swelling  If you need a refill on your cardiac medications before your next appointment, please call your pharmacy.  Labwork: None Ordered   Testing/Procedures: None Ordered   Follow-Up: You will need a follow up appointment in 6 months.  Please call our office 2 months in advance to schedule this appointment.  You may see Minus Breeding, MD or one of the following Advanced Practice Providers on your designated Care Team:   Rosaria Ferries, PA-C . Jory Sims, DNP, ANP     At [facility], you and your health needs are our priority.  As part of our continuing mission to provide you with exceptional heart care, we have created designated Provider Care Teams.  These Care Teams include your primary Cardiologist (physician) and Advanced Practice Providers (APPs -  Physician Assistants and Nurse Practitioners) who all work together to provide you with the care you need, when you need it.  Thank you for choosing [facility] at [facility]!!

## 2018-04-19 ENCOUNTER — Telehealth: Payer: Self-pay | Admitting: Cardiology

## 2018-04-19 ENCOUNTER — Other Ambulatory Visit (HOSPITAL_COMMUNITY): Payer: Self-pay | Admitting: *Deleted

## 2018-04-19 DIAGNOSIS — R1310 Dysphagia, unspecified: Secondary | ICD-10-CM

## 2018-04-19 NOTE — Telephone Encounter (Signed)
Follow Up:   She is calling, concerning application for pt's Eliquis.

## 2018-04-22 DIAGNOSIS — G4733 Obstructive sleep apnea (adult) (pediatric): Secondary | ICD-10-CM | POA: Diagnosis not present

## 2018-04-26 NOTE — Telephone Encounter (Signed)
Application for Eliquis faxed to [employer]

## 2018-04-30 ENCOUNTER — Encounter (HOSPITAL_COMMUNITY): Payer: BLUE CROSS/BLUE SHIELD

## 2018-04-30 ENCOUNTER — Ambulatory Visit (HOSPITAL_COMMUNITY): Payer: BLUE CROSS/BLUE SHIELD

## 2018-04-30 DIAGNOSIS — R7301 Impaired fasting glucose: Secondary | ICD-10-CM | POA: Diagnosis not present

## 2018-04-30 DIAGNOSIS — I429 Cardiomyopathy, unspecified: Secondary | ICD-10-CM | POA: Diagnosis not present

## 2018-04-30 DIAGNOSIS — I10 Essential (primary) hypertension: Secondary | ICD-10-CM | POA: Diagnosis not present

## 2018-04-30 DIAGNOSIS — E785 Hyperlipidemia, unspecified: Secondary | ICD-10-CM | POA: Diagnosis not present

## 2018-05-01 DIAGNOSIS — R972 Elevated prostate specific antigen [PSA]: Secondary | ICD-10-CM | POA: Diagnosis not present

## 2018-05-22 DIAGNOSIS — G4733 Obstructive sleep apnea (adult) (pediatric): Secondary | ICD-10-CM | POA: Diagnosis not present

## 2018-06-01 ENCOUNTER — Other Ambulatory Visit: Payer: Self-pay | Admitting: Cardiology

## 2018-06-11 ENCOUNTER — Ambulatory Visit (HOSPITAL_COMMUNITY)
Admission: RE | Admit: 2018-06-11 | Discharge: 2018-06-11 | Disposition: A | Discharge status: Home / Self Care | Payer: BLUE CROSS/BLUE SHIELD | Source: Ambulatory Visit | Attending: Primary Care | Admitting: Primary Care

## 2018-06-11 ENCOUNTER — Other Ambulatory Visit: Payer: Self-pay

## 2018-06-11 DIAGNOSIS — J189 Pneumonia, unspecified organism: Secondary | ICD-10-CM | POA: Insufficient documentation

## 2018-06-11 DIAGNOSIS — R1310 Dysphagia, unspecified: Secondary | ICD-10-CM | POA: Diagnosis not present

## 2018-06-11 NOTE — Therapy (Signed)
Objective Swallowing Evaluation: Type of Study: MBS-Modified Barium Swallow Study   Patient Details  Name: [name] MRN: [id number] Date of Birth: 06-04-1955  Today's Date: 06/11/2018 Time: SLP Start Time (ACUTE ONLY): 1123 -SLP Stop Time (ACUTE ONLY): 1146  SLP Time Calculation (min) (ACUTE ONLY): 23 min   Past Medical History:  Past Medical History:  Diagnosis Date  . Acute systolic HF (heart failure) ([place])   . Atrial fibrillation ([place])   . Diabetes mellitus, new onset ([place])   . Dyslipidemia   . Gout attack 01/16/2012  . Hypertension   . Lymphoma ([place])   . NHL dx'd 01/2010  . Venous insufficiency   . Venous stasis ulcers ([facility])    Past Surgical History:  Past Surgical History:  Procedure Laterality Date  . BRONCHOSCOPY    . ESOPHAGOGASTRODUODENOSCOPY ENDOSCOPY     Dr Jearld Fenton  . INGUINAL HERNIA REPAIR     2001  . PORTA CATH INSERTION  2011   HPI: 63 yr old seen for outpatient MBS. He reports coughing in the morning, after he eats, at night and expectorating mucous. Hospitalized 02/2017 for ARDS due to flu/pna, vent, trach. He noted he was taking "something for reflux" while in the hospital and it seemed to get worse after he left the hospital once he was off the meds. He denies sensation of food or liquid "going the wrong way."    No data recorded   Assessment / Plan / Recommendation  CHL IP CLINICAL IMPRESSIONS 06/11/2018  Clinical Impression Pt's oral and pharyngeal phases of swallow were within normal limits. No impairments in laryngeal elevation, hyolaryngeal excursion or epiglottic deflection and protected trachea preventing penetration or aspiration from occurring. System challenged with consecutive straw sips without difficulty and pill consumed safely. MBS does not diagnose below the level of the UES however it was scanned revealing swift transit of pill but questionable stasis. Discussed results and recommend pt talk to MD re: trial of meds for reflux and/or if  symptoms worsen, referral to GI. Educated pt re: general esophageal precautions and appropriated textures, liquids.    SLP Visit Diagnosis Dysphagia, unspecified (R13.10)  Attention and concentration deficit following --  Frontal lobe and executive function deficit following --  Impact on safety and function Mild aspiration risk      CHL IP TREATMENT RECOMMENDATION 06/11/2018  Treatment Recommendations No treatment recommended at this time     Prognosis 03/21/2017  Prognosis for Safe Diet Advancement Good  Barriers to Reach Goals --  Barriers/Prognosis Comment --    CHL IP DIET RECOMMENDATION 06/11/2018  SLP Diet Recommendations Regular solids;Thin liquid  Liquid Administration via Cup;Straw  Medication Administration Whole meds with liquid  Compensations --  Postural Changes Remain semi-upright after after feeds/meals (Comment);Seated upright at 90 degrees      CHL IP OTHER RECOMMENDATIONS 06/11/2018  Recommended Consults Consider esophageal assessment  Oral Care Recommendations Oral care BID  Other Recommendations --      CHL IP FOLLOW UP RECOMMENDATIONS 06/11/2018  Follow up Recommendations Other (comment)      CHL IP FREQUENCY AND DURATION 03/21/2017  Speech Therapy Frequency (ACUTE ONLY) min 2x/week  Treatment Duration 2 weeks           CHL IP ORAL PHASE 06/11/2018  Oral Phase WFL  Oral - Pudding Teaspoon --  Oral - Pudding Cup --  Oral - Honey Teaspoon --  Oral - Honey Cup --  Oral - Nectar Teaspoon --  Oral - Nectar Cup --  Oral - Nectar Straw --  Oral - Thin Teaspoon --  Oral - Thin Cup --  Oral - Thin Straw --  Oral - Puree --  Oral - Mech Soft --  Oral - Regular --  Oral - Multi-Consistency --  Oral - Pill --  Oral Phase - Comment --    CHL IP PHARYNGEAL PHASE 06/11/2018  Pharyngeal Phase WFL  Pharyngeal- Pudding Teaspoon --  Pharyngeal --  Pharyngeal- Pudding Cup --  Pharyngeal --  Pharyngeal- Honey Teaspoon --  Pharyngeal --  Pharyngeal- Honey Cup --   Pharyngeal --  Pharyngeal- Nectar Teaspoon --  Pharyngeal --  Pharyngeal- Nectar Cup --  Pharyngeal --  Pharyngeal- Nectar Straw --  Pharyngeal --  Pharyngeal- Thin Teaspoon --  Pharyngeal --  Pharyngeal- Thin Cup --  Pharyngeal --  Pharyngeal- Thin Straw --  Pharyngeal --  Pharyngeal- Puree --  Pharyngeal --  Pharyngeal- Mechanical Soft --  Pharyngeal --  Pharyngeal- Regular --  Pharyngeal --  Pharyngeal- Multi-consistency --  Pharyngeal --  Pharyngeal- Pill --  Pharyngeal --  Pharyngeal Comment --     CHL IP CERVICAL ESOPHAGEAL PHASE 06/11/2018  Cervical Esophageal Phase WFL  Pudding Teaspoon --  Pudding Cup --  Honey Teaspoon --  Honey Cup --  Nectar Teaspoon --  Nectar Cup --  Nectar Straw --  Thin Teaspoon --  Thin Cup --  Thin Straw --  Puree --  Mechanical Soft --  Regular --  Multi-consistency --  Pill --  Cervical Esophageal Comment --     Houston Siren 06/11/2018, 12:10 PM  Orbie Pyo Jazari Ober M.Ed Risk analyst [phone] Office [phone]

## 2018-06-21 ENCOUNTER — Telehealth: Payer: Self-pay | Admitting: Emergency Medicine

## 2018-06-21 MED ORDER — OMEPRAZOLE 20 MG PO CPDR: 20.0000 mg | DELAYED_RELEASE_CAPSULE | Freq: Every day | ORAL | 1 refills | Status: DC

## 2018-06-21 NOTE — Telephone Encounter (Signed)
Barium swallow appeared normal. Questionable stasis with pill intact at the lower esophagus. Recommend omeprazole 20mg  in the morning before breakfast for 4-6 weeks (we can send in RX or can get OTC). If symptoms worsen refer to GI.  Pt aware of results Pt would like rx sent to [facility] sent nothing further needed.

## 2018-06-22 DIAGNOSIS — G4733 Obstructive sleep apnea (adult) (pediatric): Secondary | ICD-10-CM | POA: Diagnosis not present

## 2018-07-11 DIAGNOSIS — U071 COVID-19: Secondary | ICD-10-CM

## 2018-07-11 DIAGNOSIS — J1282 Pneumonia due to coronavirus disease 2019: Secondary | ICD-10-CM

## 2018-07-11 HISTORY — DX: COVID-19: U07.1

## 2018-07-11 HISTORY — DX: Pneumonia due to coronavirus disease 2019: J12.82

## 2018-07-17 ENCOUNTER — Other Ambulatory Visit: Payer: Self-pay | Admitting: Primary Care

## 2018-07-30 ENCOUNTER — Encounter: Payer: Self-pay | Admitting: Podiatry

## 2018-07-30 ENCOUNTER — Other Ambulatory Visit: Payer: Self-pay

## 2018-07-30 ENCOUNTER — Ambulatory Visit (INDEPENDENT_AMBULATORY_CARE_PROVIDER_SITE_OTHER): Payer: BC Managed Care – PPO

## 2018-07-30 ENCOUNTER — Ambulatory Visit: Payer: BC Managed Care – PPO | Admitting: Podiatry

## 2018-07-30 VITALS — Temp 98.1°F

## 2018-07-30 DIAGNOSIS — R7301 Impaired fasting glucose: Secondary | ICD-10-CM | POA: Diagnosis not present

## 2018-07-30 DIAGNOSIS — L03032 Cellulitis of left toe: Secondary | ICD-10-CM | POA: Diagnosis not present

## 2018-07-30 DIAGNOSIS — L97529 Non-pressure chronic ulcer of other part of left foot with unspecified severity: Secondary | ICD-10-CM

## 2018-07-30 DIAGNOSIS — L02612 Cutaneous abscess of left foot: Secondary | ICD-10-CM

## 2018-07-30 DIAGNOSIS — I10 Essential (primary) hypertension: Secondary | ICD-10-CM | POA: Diagnosis not present

## 2018-07-30 MED ORDER — MUPIROCIN 2 % EX OINT: 1.0000 "application " | TOPICAL_OINTMENT | Freq: Two times a day (BID) | CUTANEOUS | 2 refills | Status: DC

## 2018-07-30 MED ORDER — DOXYCYCLINE HYCLATE 100 MG PO TABS: 100.0000 mg | ORAL_TABLET | Freq: Two times a day (BID) | ORAL | 0 refills | Status: DC

## 2018-07-30 NOTE — Progress Notes (Signed)
Subjective:   Patient ID: [name], male   DOB: 63 y.o.   MRN: [id number]   HPI 63 year old male presents the office today for concerns of a blister on the bottom of his left big toe.  He states this area started about 2 years ago when he was wearing water shoes in [place].  Only treatment he has had for this and has been in the hospital he has been wearing compression socks by Dr. Sharol Given.  He states the lesion will scab over and then fall off and he does this multiple times.  There is also in the hospital for several months given pneumonia last year he had some treatment then but he has had no treatment as an outpatient for the wound.  He does relate that the toes become more swollen and red recently he is noticed a sore on the second toe.  He does keep antibiotic ointment on the area at times.  Denies any significant drainage or pus.  Per chart he is diabetic but he states that he is not worse.   Review of Systems  All other systems reviewed and are negative.       Objective:  Physical Exam  General: AAO x3, NAD  Dermatological: The plantar aspect of the left hallux is granular wound with hyperkeratotic tissue.  Mild malodor is present.  Upon debridement there is no purulence identified.  There is also a wound present in the dorsal aspect of left second toe with some fibrotic tissue with a granular base.  Mild hyperkeratotic tissue as well.  There is localized edema and erythema to the left hallux as well as the second digit but there is no ascending cellulitis.  No fluctuation crepitation.      Vascular: Dorsalis Pedis artery and Posterior Tibial artery pedal pulses are 2/4 bilateral with immedate capillary fill time. There is no pain with calf compression, swelling, warmth, erythema.   Neruologic: Sensation decreased with Semmes Weinstein monofilament  Musculoskeletal: No gross boney pedal deformities bilateral. No pain, crepitus, or limitation noted with foot and ankle range of  motion bilateral. Muscular strength 5/5 in all groups tested bilateral.      Assessment:   Chronic ulcerations left foot with localized cellulitis     Plan:  -Treatment options discussed including all alternatives, risks, and complications -Etiology of symptoms were discussed -X-rays were obtained and reviewed with the patient.  No definitive cortical changes present in the hallux however there is chronic deformities to the second toe which could represent some chronic osteomyelitis. -I debrided both the wound utilizing a 312 with scalpel that any complications on the healthy, bleeding, granular tissue.  We will give mupirocin ointment dressing changes daily which I prescribed. -Prescribe doxycycline -Dispensed offloading shoe -Encouraged elevation and monitor activity -Discussed that he is at high risk of amputation -Blood work ordered today- CBC, ESR, CRP, BMP, A1c  Return in about 10 days (around 08/09/2018).  Trula Slade DPM

## 2018-08-02 ENCOUNTER — Other Ambulatory Visit: Payer: Self-pay | Admitting: Podiatry

## 2018-08-02 ENCOUNTER — Telehealth: Payer: Self-pay | Admitting: Podiatry

## 2018-08-02 ENCOUNTER — Telehealth: Payer: Self-pay | Admitting: *Deleted

## 2018-08-02 MED ORDER — SULFAMETHOXAZOLE-TRIMETHOPRIM 800-160 MG PO TABS: 1.0000 | ORAL_TABLET | Freq: Two times a day (BID) | ORAL | 0 refills | Status: DC

## 2018-08-02 NOTE — Telephone Encounter (Signed)
Dr Jacqualyn Posey called in Bactrim to the pharmacy and I called and spoke with the patient and stated that the medicine was at the pharmacy. Lattie Haw

## 2018-08-02 NOTE — Telephone Encounter (Signed)
Pt states the doxycycline is tearing his stomach up.

## 2018-08-03 DIAGNOSIS — R05 Cough: Secondary | ICD-10-CM | POA: Diagnosis not present

## 2018-08-03 DIAGNOSIS — Z7901 Long term (current) use of anticoagulants: Secondary | ICD-10-CM | POA: Diagnosis not present

## 2018-08-03 DIAGNOSIS — J189 Pneumonia, unspecified organism: Secondary | ICD-10-CM | POA: Diagnosis not present

## 2018-08-03 DIAGNOSIS — Z20818 Contact with and (suspected) exposure to other bacterial communicable diseases: Secondary | ICD-10-CM | POA: Diagnosis not present

## 2018-08-03 NOTE — Telephone Encounter (Signed)
Spoke with the patient yesterday and Per Dr Jacqualyn Posey sent to pharmacy the bactrim. Lattie Haw

## 2018-08-06 ENCOUNTER — Telehealth: Payer: Self-pay | Admitting: *Deleted

## 2018-08-06 ENCOUNTER — Other Ambulatory Visit: Payer: Self-pay

## 2018-08-06 ENCOUNTER — Ambulatory Visit: Payer: BC Managed Care – PPO | Admitting: Podiatry

## 2018-08-06 DIAGNOSIS — L02612 Cutaneous abscess of left foot: Secondary | ICD-10-CM | POA: Diagnosis not present

## 2018-08-06 DIAGNOSIS — L97529 Non-pressure chronic ulcer of other part of left foot with unspecified severity: Secondary | ICD-10-CM | POA: Diagnosis not present

## 2018-08-06 DIAGNOSIS — U071 COVID-19: Secondary | ICD-10-CM | POA: Diagnosis not present

## 2018-08-06 DIAGNOSIS — L03032 Cellulitis of left toe: Secondary | ICD-10-CM | POA: Diagnosis not present

## 2018-08-06 NOTE — Telephone Encounter (Signed)
A message was left, re: follow up visit. 

## 2018-08-07 DIAGNOSIS — U071 COVID-19: Secondary | ICD-10-CM | POA: Insufficient documentation

## 2018-08-07 NOTE — Progress Notes (Signed)
Subjective: 63 year old male presents the office today for follow-up evaluation of a wound of his left foot.  He states that overall is doing better and is still on antibiotics.  He is not had any significant drainage or pus coming from the area and denies any red streaks.  Overall the wounds are about the same otherwise. Denies any systemic complaints such as fevers, chills, nausea, vomiting. No acute changes since last appointment, and no other complaints at this time.   Objective: AAO x3, NAD DP/PT pulses palpable bilaterally, CRT less than 3 seconds Ulceration continues on the plantar medial aspect of the left hallux as well as the dorsal aspect left second toe.  There is decreased edema and erythema to the area there is no ascending cellulitis.  There is no fluctuation crepitation.  No malodor.  Hyperkeratotic tissue to the wound on the right hallux. No open lesions or pre-ulcerative lesions.  No pain with calf compression, swelling, warmth, erythema  Assessment: 63 year old male left foot infection, ulcerations; COVID positive  Plan: -All treatment options discussed with the patient including all alternatives, risks, complications.  -As I entered the room today he had any stop.  While he was in the room today he states he just received a phone call while in the office that he was positive for COVID.  Upon office was notified as well as anyone had contact with him.  All surfaces were cleaned.  Appropriate PPE was used for the visit today.  -I did debride the wound on the left hallux today without any complications.  I want to continue mupirocin ointment dressing changes.  Plan continue antibiotics.  Encouraged elevation.  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  There is any issues to let me know.  If there is any worsening infection he is to emergency department.  Should he need to go the emergency department for worsening foot infection he will either call me  or the ER prior to going.  -I would like to recheck blood work when able. -Continue offloading.  -Patient encouraged to call the office with any questions, concerns, change in symptoms.   Trula Slade DPM

## 2018-08-27 ENCOUNTER — Ambulatory Visit: Payer: BC Managed Care – PPO | Admitting: Podiatry

## 2018-08-27 ENCOUNTER — Other Ambulatory Visit: Payer: Self-pay

## 2018-08-27 ENCOUNTER — Encounter: Payer: Self-pay | Admitting: Podiatry

## 2018-08-27 DIAGNOSIS — L97529 Non-pressure chronic ulcer of other part of left foot with unspecified severity: Secondary | ICD-10-CM

## 2018-09-03 NOTE — Progress Notes (Signed)
Subjective: 63 year old male presents the office today for follow-up evaluation of a wound of his left foot.  He thinks the wound is doing much better.  He denies any drainage or pus from that area and denies any swelling or redness.  No pain.  Is been wearing a surgical shoe. 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  Denies any systemic complaints such as fevers, chills, nausea, vomiting. No acute changes since last appointment, and no other complaints at this time.   Objective: AAO x3, NAD DP/PT pulses palpable bilaterally, CRT less than 3 seconds Ulceration continues on the plantar medial aspect of the left hallux as well as the dorsal aspect left second toe.  On the dorsal aspect the second toe appears to be scabbing over.  Wound does continue on the plantar aspect of the hallux although improving.  Granular wound base with hyperkeratotic periwound.  There is no probing to bone, undermining or tunneling.  No surrounding erythema, ascending cellulitis.  No fluctuation crepitation any malodor. No pain with calf compression, swelling, warmth, erythema  Assessment: 63 year old male left foot resolving infection, ulcerations  Plan: -All treatment options discussed with the patient including all alternatives, risks, complications.  -Patient debrided both the wounds have any complications on the healthy tissue.  Provided daily dressing changes with medihoney.  Offloading at all times.  Further modify the surgical shoe.  Elevation.  Discussed contrast the office as much as possible. -Monitor for any clinical signs or symptoms of infection and directed to call the office immediately should any occur or go to the ER.  Return in about 2 weeks (around 09/10/2018).  Trula Slade DPM

## 2018-09-10 ENCOUNTER — Other Ambulatory Visit: Payer: Self-pay

## 2018-09-10 ENCOUNTER — Ambulatory Visit: Payer: BC Managed Care – PPO | Admitting: Podiatry

## 2018-09-10 ENCOUNTER — Telehealth: Payer: Self-pay | Admitting: *Deleted

## 2018-09-10 DIAGNOSIS — L97529 Non-pressure chronic ulcer of other part of left foot with unspecified severity: Secondary | ICD-10-CM | POA: Diagnosis not present

## 2018-09-10 DIAGNOSIS — L02612 Cutaneous abscess of left foot: Secondary | ICD-10-CM

## 2018-09-10 NOTE — Telephone Encounter (Signed)
-----   Message from Trula Slade, DPM sent at 09/10/2018  3:40 PM EDT ----- Can you please order arterial duplex due to wound on left 1st and 2nd toes

## 2018-09-10 NOTE — Telephone Encounter (Signed)
Faxed orders to [facility]. 

## 2018-09-10 NOTE — Progress Notes (Signed)
Subjective: 63 year old male presents the office today for follow-up evaluation of a wound of his left foot.  He thinks the wound is doing better.  Denies any drainage or pus and denies any swelling or redness to the area he thinks it looks a lot better as well.  Is been keeping medihoney on the wound daily to both the first and second toes.  This is been an ongoing wound for quite some time without any significant improvement. Denies any systemic complaints such as fevers, chills, nausea, vomiting. No acute changes since last appointment, and no other complaints at this time.   Objective: AAO x3, NAD DP/PT pulses palpable bilaterally, CRT less than 3 seconds Ulceration continues on the plantar medial aspect of the left hallux as well as the dorsal aspect left second toe.  On the plantar aspect the hallux measures approximately 1.8 x 0.6 x0.1 cm at the widest proximally and 0.2 distally.  It is almost a teardrop in shape also ulceration of the dorsal aspect the second toe measuring 0.5 x 0.5 cm without any probing to bone, undermining or tunneling.  No fluctuation crepitation, and the wounds. No pain with calf compression, swelling, warmth, erythema  Assessment: 64 year old male left foot resolving infection, ulcerations  Plan: -All treatment options discussed with the patient including all alternatives, risks, complications.  -I debrided both the wounds today utilizing a 312 with scalpel any complications or bleeding.  For now we will continue with medihoney dressing changes.  We will order silver collagen dressing changes today through [facility].  -Continue offloading at all times.  Continue with surgical shoe.  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 -We will order arterial studies. -Monitor for any clinical signs or symptoms of infection and directed to call the office immediately should any occur or go to the ER.  Follow-up in 2 weeks or sooner if needed  Trula Slade  DPM

## 2018-09-11 ENCOUNTER — Telehealth: Payer: Self-pay | Admitting: *Deleted

## 2018-09-11 NOTE — Telephone Encounter (Signed)
Dr. Jacqualyn Posey ordered silver collagen, sterile gauze, conforming roll gauze and tape for daily dressing changes to left 1st plantar toe 1.8 x 0.6 x 0.1cm and left 2nd dorsal toe 0.5 x 0.5 x 0.1cm L97.529, from [facility]. Faxed orders to [facility].

## 2018-09-11 NOTE — Telephone Encounter (Signed)
-----   Message from Trula Slade, DPM sent at 09/10/2018  6:31 PM EDT ----- I left wound supply form on your computer for this patient and just wanted to make sure you got them!  Thanks.

## 2018-09-12 NOTE — Telephone Encounter (Addendum)
[facility] states pt's supplies are ready to go, but the demographics sheet was cut off and they have no infaormation to contact pt, please fax to [phone]. Faxed demographics to [facility].

## 2018-09-13 ENCOUNTER — Inpatient Hospital Stay (HOSPITAL_COMMUNITY): Admission: RE | Admit: 2018-09-13 | Payer: BLUE CROSS/BLUE SHIELD | Source: Ambulatory Visit

## 2018-09-24 ENCOUNTER — Encounter: Payer: Self-pay | Admitting: Podiatry

## 2018-09-24 ENCOUNTER — Other Ambulatory Visit: Payer: Self-pay

## 2018-09-24 ENCOUNTER — Ambulatory Visit (INDEPENDENT_AMBULATORY_CARE_PROVIDER_SITE_OTHER): Payer: BC Managed Care – PPO | Admitting: Podiatry

## 2018-09-24 DIAGNOSIS — L97529 Non-pressure chronic ulcer of other part of left foot with unspecified severity: Secondary | ICD-10-CM | POA: Diagnosis not present

## 2018-10-01 ENCOUNTER — Other Ambulatory Visit: Payer: Self-pay

## 2018-10-01 ENCOUNTER — Ambulatory Visit (HOSPITAL_COMMUNITY)
Admission: RE | Admit: 2018-10-01 | Discharge: 2018-10-01 | Disposition: A | Discharge status: Home / Self Care | Payer: BC Managed Care – PPO | Source: Ambulatory Visit | Attending: Cardiology | Admitting: Cardiology

## 2018-10-01 DIAGNOSIS — L97529 Non-pressure chronic ulcer of other part of left foot with unspecified severity: Secondary | ICD-10-CM | POA: Diagnosis not present

## 2018-10-01 DIAGNOSIS — L02612 Cutaneous abscess of left foot: Secondary | ICD-10-CM

## 2018-10-01 DIAGNOSIS — L03032 Cellulitis of left toe: Secondary | ICD-10-CM | POA: Diagnosis not present

## 2018-10-02 ENCOUNTER — Telehealth: Payer: Self-pay | Admitting: *Deleted

## 2018-10-02 NOTE — Telephone Encounter (Signed)
I informed pt of Dr. Wagoner's review of results. 

## 2018-10-02 NOTE — Telephone Encounter (Signed)
-----   Message from Trula Slade, DPM sent at 10/02/2018  1:16 PM EDT ----- Circulation should be adequate to heal wounds. Please let him know. Thanks.

## 2018-10-05 NOTE — Progress Notes (Signed)
Subjective: 63 year old male presents the office today for follow-up evaluation of a wound of his left foot.  He denies any drainage or pus coming from the area.  He has been using Prisma on the wound daily.  He is not sure if it is improving.  Denies any significant swelling or redness.  Still in a surgical shoe.  Denies any systemic complaints such as fevers, chills, nausea, vomiting. No acute changes since last appointment, and no other complaints at this time.   Objective: AAO x3, NAD DP/PT pulses palpable bilaterally, CRT less than 3 seconds Ulceration continues on the plantar medial aspect of the left hallux as well as the dorsal aspect left second toe.  On the plantar aspect the hallux measures approximately 1.6 x 0.6 x0.1 cm.  It is a teardrop type lesion.  Certainly closer distally.  On the dorsal aspect the left second toe this appears to be improved and is more of a granular wound base.  There is no probing, undermining or tunneling.  No fluctuation crepitation No pain with calf compression, swelling, warmth, erythema  Assessment: 63 year old male left foot resolving infection, ulcerations  Plan: -All treatment options discussed with the patient including all alternatives, risks, complications.  -I debrided both the wounds today utilizing a 312 with scalpel any complications or bleeding. Continue Prisma dressing changes daily -Continue offloading at all times.  Try to limit the regards feet all this is difficult with his job. -Awaiting arterial studies  Return in about 2 weeks (around 10/08/2018).  Trula Slade DPM

## 2018-10-08 ENCOUNTER — Other Ambulatory Visit: Payer: Self-pay

## 2018-10-08 ENCOUNTER — Other Ambulatory Visit: Payer: Self-pay | Admitting: Cardiology

## 2018-10-08 ENCOUNTER — Encounter: Payer: Self-pay | Admitting: Podiatry

## 2018-10-08 ENCOUNTER — Ambulatory Visit: Payer: BC Managed Care – PPO | Admitting: Podiatry

## 2018-10-08 DIAGNOSIS — L97529 Non-pressure chronic ulcer of other part of left foot with unspecified severity: Secondary | ICD-10-CM

## 2018-10-09 ENCOUNTER — Telehealth: Payer: Self-pay | Admitting: *Deleted

## 2018-10-09 NOTE — Telephone Encounter (Signed)
Left message for Vinnie - Tamala Julian Nephew to call with information concerning Regranex use for pt with history of Lymphoma.

## 2018-10-09 NOTE — Telephone Encounter (Signed)
-----   Message from Trula Slade, DPM sent at 10/08/2018  3:39 PM EDT ----- Can you please order regranex?  Also he has a history of lymphoma but I am pretty sure they removed the contraindication of malignancy with this medicine. Can you check with the rep? I have not seen him in some time.

## 2018-10-09 NOTE — Telephone Encounter (Signed)
Smith Nephew - Vinnie states since the Regranex black box warning had been removed there is no concern for using Regranex with a pt with previous history of Lymphoma, only concern would be if a neoplasm was at the wound site. Vinnie states there is a 1 to 1 1/2 week back order for Regranex into mid-October, suggest keep area clean with Santyl for 2 weeks or if area is ready for graft recommended GraFix.

## 2018-10-10 MED ORDER — REGRANEX 0.01 % EX GEL: 1.0000 "application " | Freq: Every day | CUTANEOUS | 5 refills | Status: DC

## 2018-10-10 MED ORDER — SANTYL 250 UNIT/GM EX OINT: 1.0000 "application " | TOPICAL_OINTMENT | Freq: Every day | CUTANEOUS | 5 refills | Status: DC

## 2018-10-10 NOTE — Telephone Encounter (Signed)
-----   Message from Trula Slade, DPM sent at 10/10/2018 10:47 AM EDT ----- OK. Please order santyl.  ----- Message ----- From: Andres Ege, RN Sent: 10/10/2018   9:40 AM EDT To: Trula Slade, DPM  Dr. Jacqualyn Posey, Vinnie - Regranex states no complication with pt hx of lymphoma, but is on 2 week back order, recommends keep area clean with Santyl for 2 weeks or if graft ready GraFix. Please advise. Marcy Siren ----- Message ----- From: Trula Slade, DPM Sent: 10/08/2018   3:39 PM EDT To: Andres Ege, RN  Can you please order regranex?  Also he has a history of lymphoma but I am pretty sure they removed the contraindication of malignancy with this medicine. Can you check with the rep? I have not seen him in some time.

## 2018-10-10 NOTE — Progress Notes (Signed)
Subjective: 63 year old male presents the office today for follow-up evaluation of a wound of his left foot.  He thinks the wound is doing somewhat better.  Denies any drainage or pus.  He still been using the Prisma daily.  He did have his arterial studies performed.  No drainage or pus. Denies any systemic complaints such as fevers, chills, nausea, vomiting. No acute changes since last appointment, and no other complaints at this time.   Objective: AAO x3, NAD DP/PT pulses palpable bilaterally, CRT less than 3 seconds Ulceration continues on the plantar medial aspect of the left hallux as well as the dorsal aspect left second toe.  On the plantar aspect the hallux measures approximately 1.5 x 0.6 x0.1 cm.  The wound is granular.  On the dorsal aspect of the left second toe is a fibrotic wound on the PIPJ.  There is no cellulitis there is no ascending cellulitis.  No fluctuation palpitation.  No drainage or pus identified today. No fluctuation crepitation No pain with calf compression, swelling, warmth, erythema  Assessment: 63 year old male left foot resolving infection, ulcerations  Plan: -All treatment options discussed with the patient including all alternatives, risks, complications.  -I debrided both the wounds today utilizing a 312 with scalpel any complications or bleeding. Continue Prisma dressing changes daily -I tried to order Regranex for him however this is on back order.  Annitta Needs is not covered by insurance.  We will continue with current dressings. -Reviewed arterial studies. -Darco wedge shoe dispensed for further offloading  Return in about 3 weeks (around 10/29/2018).  Trula Slade DPM

## 2018-10-10 NOTE — Telephone Encounter (Signed)
[place] states Annitta Needs is not covered by insurance. I informed Dr. Jacqualyn Posey and he ordered pt to continue the Prisma.

## 2018-10-10 NOTE — Telephone Encounter (Signed)
Can you please order Santyl? Thanks.

## 2018-10-10 NOTE — Telephone Encounter (Signed)
Regranex - [facility] required form completed, medication to begin within 21 days due to backorder, for right hallux wound 1.6 x 0. 6 x 0.1cm daily use.

## 2018-10-10 NOTE — Telephone Encounter (Signed)
Left message informing pt Dr. Jacqualyn Posey would like him to continue the Prisma until the Regranex was received.

## 2018-10-10 NOTE — Telephone Encounter (Signed)
Santyl - [place] required form completed, Santyl to begin 10/12/2018 for right 1st toe ulcer measuring 1.6 x 0.6 x 0.1cm for daily use until Regranex is available, also ordered on 10/10/2018.

## 2018-10-11 ENCOUNTER — Other Ambulatory Visit: Payer: Self-pay | Admitting: Primary Care

## 2018-10-25 ENCOUNTER — Telehealth: Payer: Self-pay | Admitting: *Deleted

## 2018-10-25 NOTE — Telephone Encounter (Signed)
[employer] arthorization Notification states that the patient was approved for the santyl ointment and was received on  10/24/2018 and is valid 10/19/2018 to 10/17/2021 and I called the patient to let patient know. [name]

## 2018-10-25 NOTE — Telephone Encounter (Signed)
[employer] requested ICD.10 code fax to [phone].

## 2018-10-25 NOTE — Telephone Encounter (Signed)
Faxed [employer] PA forms with [phone].

## 2018-10-29 ENCOUNTER — Ambulatory Visit: Payer: BC Managed Care – PPO | Admitting: Podiatry

## 2018-10-29 ENCOUNTER — Encounter: Payer: Self-pay | Admitting: Podiatry

## 2018-10-29 ENCOUNTER — Other Ambulatory Visit: Payer: Self-pay

## 2018-10-29 DIAGNOSIS — L97529 Non-pressure chronic ulcer of other part of left foot with unspecified severity: Secondary | ICD-10-CM | POA: Diagnosis not present

## 2018-10-29 MED ORDER — SANTYL 250 UNIT/GM EX OINT: 1.0000 "application " | TOPICAL_OINTMENT | Freq: Every day | CUTANEOUS | 5 refills | Status: DC

## 2018-11-01 ENCOUNTER — Other Ambulatory Visit: Payer: Self-pay | Admitting: Cardiology

## 2018-11-05 NOTE — Progress Notes (Signed)
Subjective: 63 year old male presents the office today for follow-up evaluation of a wound of his left foot.  He thinks they are doing okay he has no new concerns today.  He states he has had the wounds for quite some time and this was the first time he had treatment he feels that he waited too long to get the wounds treated.  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  He denies any drainage or pus coming from the wound and denies any swelling or redness.  Denies any systemic complaints such as fevers, chills, nausea, vomiting. No acute changes since last appointment, and no other complaints at this time.   Objective: AAO x3, NAD DP/PT pulses palpable bilaterally, CRT less than 3 seconds Ulceration continues on the plantar medial aspect of the left hallux as well as the dorsal aspect left second toe.  On the plantar aspect the hallux measures approximately 1.8 x 0.8 cm with a depth of 0.1.  There is no probing, undermining or tunneling.  The wound is slightly larger today.  On the dorsal aspect the second toe measures 1 x 0.8 cm with fibrotic wound base.  The wound to either of the ulcers there is no drainage or pus.  No surrounding erythema, ascending cellulitis.  No fluctuation crepitation.  There is no malodor.  No pain with calf compression, swelling, warmth, erythema  Assessment: 63 year old male left chronic ulcerations  Plan: -All treatment options discussed with the patient including all alternatives, risks, complications.  -I debrided both the wounds today utilizing a 312 with scalpel any complications or bleeding.  The Annitta Needs has been approved already ordered this today.  This will help with some of the fibrotic tissue.   -If not seeing much improvement next plan will consider wound care referral.  Return in about 2 weeks (around 11/12/2018).  Trula Slade DPM

## 2018-11-12 ENCOUNTER — Ambulatory Visit: Payer: BC Managed Care – PPO | Admitting: Podiatry

## 2018-11-12 ENCOUNTER — Other Ambulatory Visit: Payer: Self-pay

## 2018-11-12 ENCOUNTER — Ambulatory Visit (INDEPENDENT_AMBULATORY_CARE_PROVIDER_SITE_OTHER): Payer: BC Managed Care – PPO

## 2018-11-12 ENCOUNTER — Encounter: Payer: Self-pay | Admitting: Podiatry

## 2018-11-12 DIAGNOSIS — L97529 Non-pressure chronic ulcer of other part of left foot with unspecified severity: Secondary | ICD-10-CM

## 2018-11-13 NOTE — Progress Notes (Signed)
Subjective: 63 year old male presents the office today for follow-up evaluation of a wound of his left foot.  He states that his toes are "doing fine".  He thinks the Santyl has been helpful for the second toe.  He is also been keeping Prisma on the right hallux ulceration.  He denies any increase in swelling or any redness and denies any drainage or pus.  He has no pain.  Denies any fevers, chills, nausea, vomiting.  Denies any calf pain, chest pain, shortness of breath.  Objective: AAO x3, NAD DP/PT pulses palpable bilaterally, CRT less than 3 seconds Ulceration continues on the plantar medial aspect of the left hallux as well as the dorsal aspect left second toe.  Overall the wounds measure about the same however they appear to be healthier and more superficial.  There is less fibrotic tissue on the second toe and more granulation tissue.  The plantar hallux wound is more superficial.  There is a granular wound base.  There is no probing to bone on either of the lesions and there is no undermining or tunneling. No pain with calf compression, swelling, warmth, erythema        Assessment: 63 year old male left chronic ulcerations  Plan: -All treatment options discussed with the patient including all alternatives, risks, complications.  -X-rays obtained and reviewed.  No obvious signs of acute osteomyelitis there is no soft tissue emphysema. -Sharp debridement of the lesion without any complications utilizing the 312 with scalpel down to healthy, granular tissue.  Continue Santyl on the second toe as well as Prisma on the left hallux. -Continue offloading at all times. -Monitor for any clinical signs or symptoms of infection and directed to call the office immediately should any occur or go to the ER.  Return in about 2 weeks (around 11/26/2018).  Trula Slade DPM

## 2018-11-18 IMAGING — DX DG CHEST 1V PORT
1 series · 1 of 1 positions shown · non-contrast
Comparison: 02/22/2017.

CLINICAL DATA: Pneumonia.

EXAM:
PORTABLE CHEST 1 VIEW

[chest]
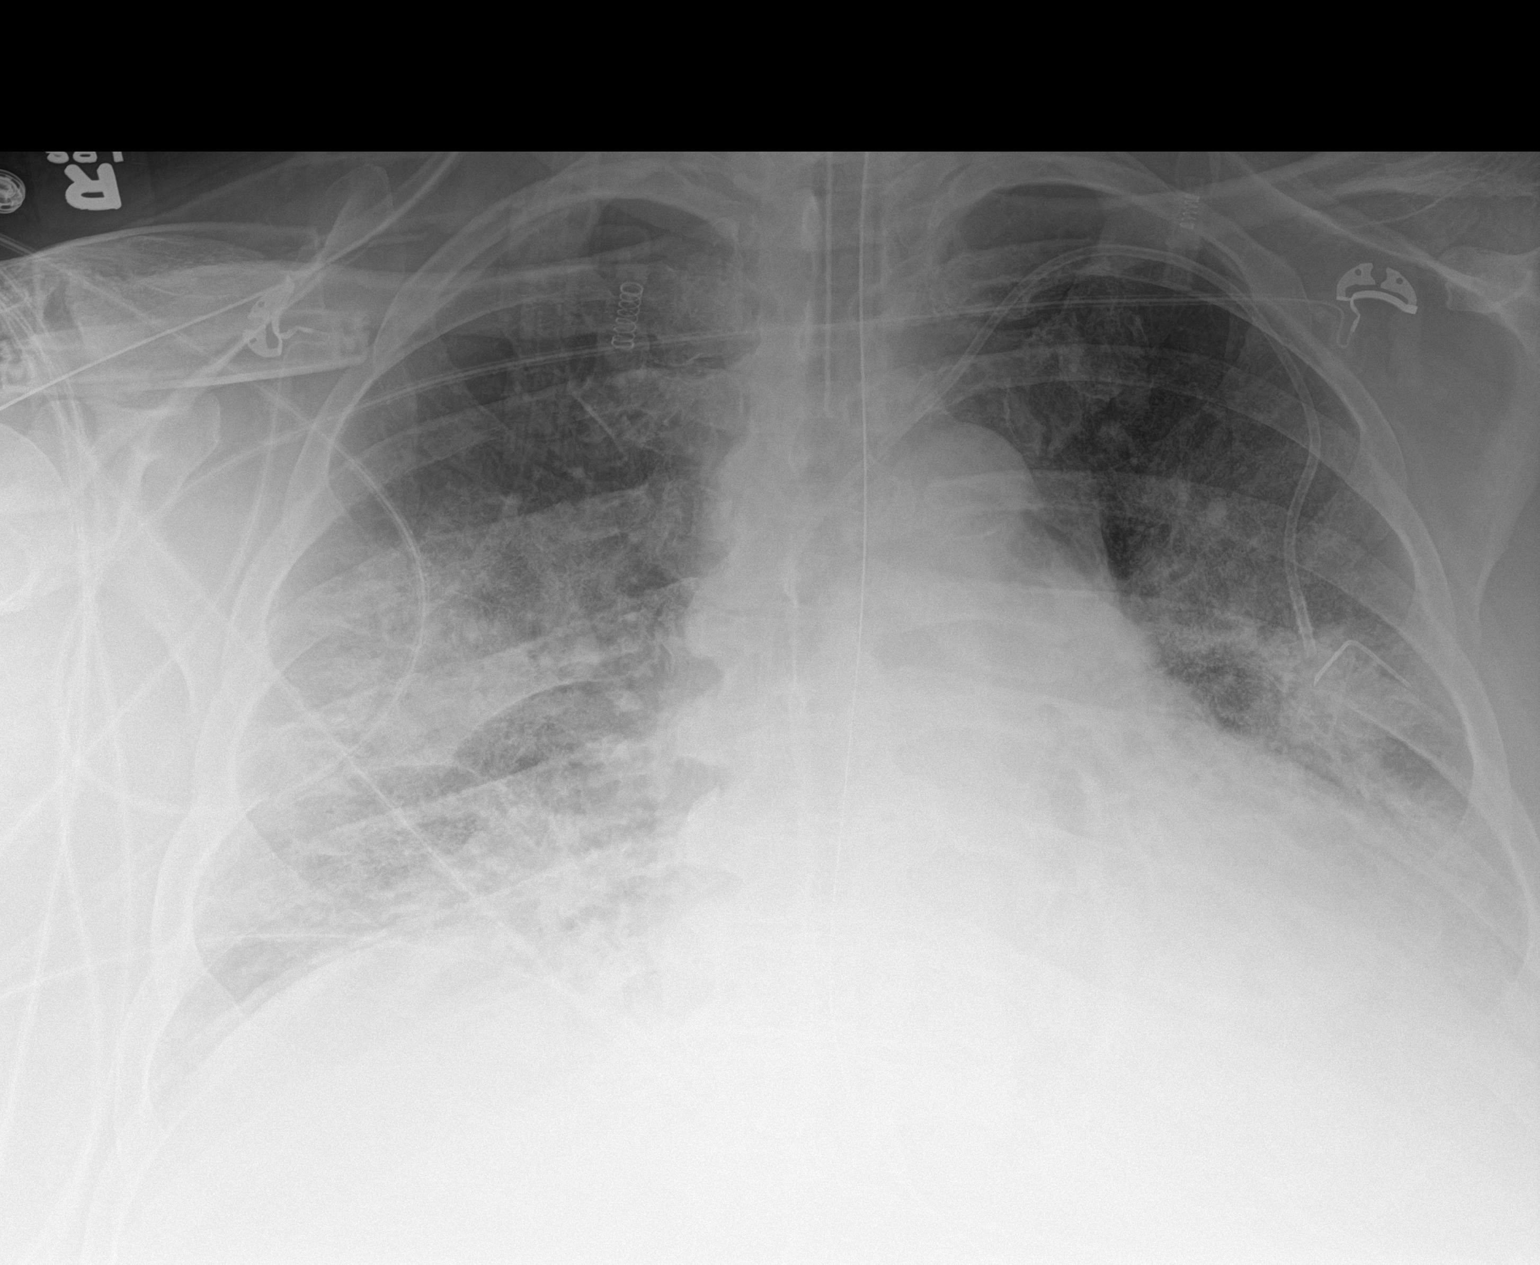

[1 of 1 positions shown; findings below may reference images not displayed]

FINDINGS: Unchanged cardiomegaly and BILATERAL pulmonary opacities. Support
tubes and lines are stable. No pneumothorax.
IMPRESSION: BILATERAL pulmonary opacities could represent a combination of
pneumonia and edema. Overall stable appearance compared with priors.

## 2018-11-18 NOTE — Progress Notes (Signed)
Cardiology Office Note   Date:  11/20/2018   ID:  [name], [name] Oct 20, 1955, MRN [id number]  PCP:  [name], MD  Cardiologist:   Minus Breeding, MD   Chief Complaint  Patient presents with  . Shortness of Breath      History of Present Illness: [name] is a 63 y.o. male who presents for evaluation of cardiomyopathy. He was in the hospital  last year withrespiratory failure related to ARDS/influenza A. He was found to have an EF of 25% which was new and new onset atrial fib. He had a protracted hospital stay and went to rehab afterward.He had a follow up echo in July and his EF had improved back to normal. He is being followed by Dr. Lamonte Sakai for sleep apnea.   He has had increasing shortness of breath since mid September.  He is not short of breath walking short distance on level ground.  Is not describing PND or orthopnea.  He works as a Theme park manager.  He walks with a cane.  He is not feeling any tachypalpitations, presyncope or syncope.  He has not had any chest pressure, neck or arm discomfort.  He has had a 38 pound weight gain in a year does not really notice any increased swelling.  Tries to avoid salt.  He works with a Physiological scientist 2 times per week.    Past Medical History:  Diagnosis Date  . Acute systolic HF (heart failure) ([place])   . Atrial fibrillation ([place])   . Diabetes mellitus, new onset ([place])   . Dyslipidemia   . Gout attack 01/16/2012  . Hypertension   . Lymphoma ([place])   . NHL dx'd 01/2010  . Venous insufficiency   . Venous stasis ulcers ([facility])     Past Surgical History:  Procedure Laterality Date  . BRONCHOSCOPY    . ESOPHAGOGASTRODUODENOSCOPY ENDOSCOPY     Dr Jearld Fenton  . INGUINAL HERNIA REPAIR     2001  . PORTA CATH INSERTION  2011     Current Outpatient Medications  Medication Sig Dispense Refill  . acetaminophen (TYLENOL) 325 MG tablet Take 650 mg by mouth every 6 (six) hours as needed.    [name] Kitchen albuterol (PROVENTIL HFA;VENTOLIN  HFA) 108 (90 Base) MCG/ACT inhaler Inhale 2 puffs into the lungs every 6 (six) hours as needed for wheezing or shortness of breath. 1 Inhaler 2  . apixaban (ELIQUIS) 5 MG TABS tablet Take 1 tablet (5 mg total) by mouth 2 (two) times daily. 60 tablet 6  . atorvastatin (LIPITOR) 20 MG tablet TAKE 1 TABLET BY MOUTH EVERY DAY *PT OVERDUE FOR OFFICE VISIT* 30 tablet 5  . becaplermin (REGRANEX) 0.01 % gel Apply 1 application topically daily. Measurements right 1st toe 1.6 x 0.6 x 0.1cm 15 g 5  . carvedilol (COREG) 25 MG tablet TAKE 1 TABLET (25 MG TOTAL) BY MOUTH 2 (TWO) TIMES DAILY WITH A MEAL. 60 tablet 2  . collagenase (SANTYL) ointment Apply 1 application topically daily. Measurements to right hallux 1.6 x 0.6 x 0.1cm for use until Regranex arrives. 30 g 5  . [employer] DIGESTIVE PROBIOTIC 250 MG capsule Take 500 mg by mouth daily.    [name] Kitchen doxycycline (VIBRA-TABS) 100 MG tablet Take 1 tablet (100 mg total) by mouth 2 (two) times daily. 20 tablet 0  . febuxostat (ULORIC) 40 MG tablet Take 1 tablet (40 mg total) by mouth daily. 30 tablet 0  . furosemide (LASIX) 20 MG tablet Take 2 tablets (  40 mg total) by mouth daily. 180 tablet 3  . KLOR-CON M20 20 MEQ tablet Take 2 tablets (40 mEq total) by mouth 2 (two) times daily. 360 tablet 3  . losartan (COZAAR) 25 MG tablet TAKE 1 TABLET BY MOUTH EVERY DAY 30 tablet 2  . Melatonin 3 MG TABS Take 1 tablet by mouth at bedtime.    . Multiple Vitamin (MULTIVITAMIN WITH MINERALS) TABS tablet Take 1 tablet by mouth daily.    . mupirocin ointment (BACTROBAN) 2 % Apply 1 application topically 2 (two) times daily. 30 g 2  . olmesartan-hydrochlorothiazide (BENICAR HCT) 40-25 MG tablet Take 1 tablet by mouth daily.    [name] Kitchen omeprazole (PRILOSEC) 20 MG capsule TAKE 1 CAPSULE BY MOUTH EVERY DAY 90 capsule 0  . omeprazole (PRILOSEC) 40 MG capsule Take by mouth daily.    . Probiotic Product (PROBIOTIC DAILY PO) Take by mouth.    . sertraline (ZOLOFT) 50 MG tablet Take 1 tablet (50 mg  total) by mouth daily. 30 tablet 0  . sodium chloride (OCEAN) 0.65 % SOLN nasal spray Place 2 sprays into both nostrils as needed for congestion.  0  . vitamin C (VITAMIN C) 500 MG tablet Take 1 tablet (500 mg total) by mouth 3 (three) times daily.     No current facility-administered medications for this visit.    Facility-Administered Medications Ordered in Other Visits  Medication Dose Route Frequency Provider Last Rate Last Dose  . sodium chloride 0.9 % injection 10 mL  10 mL Intravenous PRN Curt Bears, MD   10 mL at 05/04/15 1529    Allergies:   Latex and Penicillins    ROS:  Please see the history of present illness.   Otherwise, review of systems are positive for none.   All other systems are reviewed and negative.    PHYSICAL EXAM: VS:  BP (!) 152/82   Pulse 94   Ht 5\' 11"  (1.803 m)   Wt (!) 368 lb (166.9 kg)   SpO2 95%   BMI 51.33 kg/m  , BMI Body mass index is 51.33 kg/m.  GENERAL:  Well appearing NECK:  No jugular venous distention, waveform within normal limits, carotid upstroke brisk and symmetric, no bruits, no thyromegaly LUNGS:  Clear to auscultation bilaterally CHEST:  Unremarkable HEART:  PMI not displaced or sustained,S1 and S2 within normal limits, no S3,  no clicks, no rubs, no murmurs ABD:  Flat, positive bowel sounds normal in frequency in pitch, no bruits, no rebound, no guarding, no midline pulsatile mass, no hepatomegaly, no splenomegaly EXT:  2 plus pulses throughout, bilateral moderate leg edema, no cyanosis no clubbing    EKG:  EKG  ordered today. Atrial fibrillation, right bundle branch block, left axis deviation, left anterior fascicular block, no change from previous.   Recent Labs: 01/08/2018: ALT 21; BUN 16; Creatinine 0.84; Hemoglobin 13.4; Platelet Count 207; Potassium 4.2; Sodium 139    Lipid Panel    Component Value Date/Time   TRIG 122 02/26/2017 1306      Wt Readings from Last 3 Encounters:  11/20/18 (!) 368 lb (166.9  kg)  04/10/18 (!) 340 lb (154.2 kg)  02/26/18 (!) 347 lb 12.8 oz (157.8 kg)      Other studies Reviewed: Additional studies/ records that were reviewed today include:   None Review of the above records demonstrates:  Please see elsewhere in the note.     ASSESSMENT AND PLAN:  CARDIOMYOPATHY:    The patient's ejection fraction had  improved.  However, he is having increased dyspnea.  He had weight gain some of which is obviously lower extremity edema.  Again increase his Lasix to 40 mg daily.  He is going to increase his potassium.  To get basic metabolic profile early next week.  I will check a BNP as well.  We will check an echocardiogram.   ATRIAL FIB:     He tolerates this with rate control and anticoagulation.  No change in therapy at this point.  We decided that this has been permanent.  Current medicines are reviewed at length with the patient today.  The patient does not have concerns regarding medicines.  The following changes have been made:  As above  Labs/ tests ordered today include: As above  Orders Placed This Encounter  Procedures  . ECHOCARDIOGRAM COMPLETE     Disposition:   FU with me in 1 months.     Signed, Minus Breeding, MD  11/20/2018 5:56 PM    [place]

## 2018-11-19 IMAGING — DX DG CHEST 1V PORT
1 series · 1 of 1 positions shown · non-contrast
Comparison: Yesterday

CLINICAL DATA: Hypoxia

EXAM:
PORTABLE CHEST 1 VIEW

[chest ap]
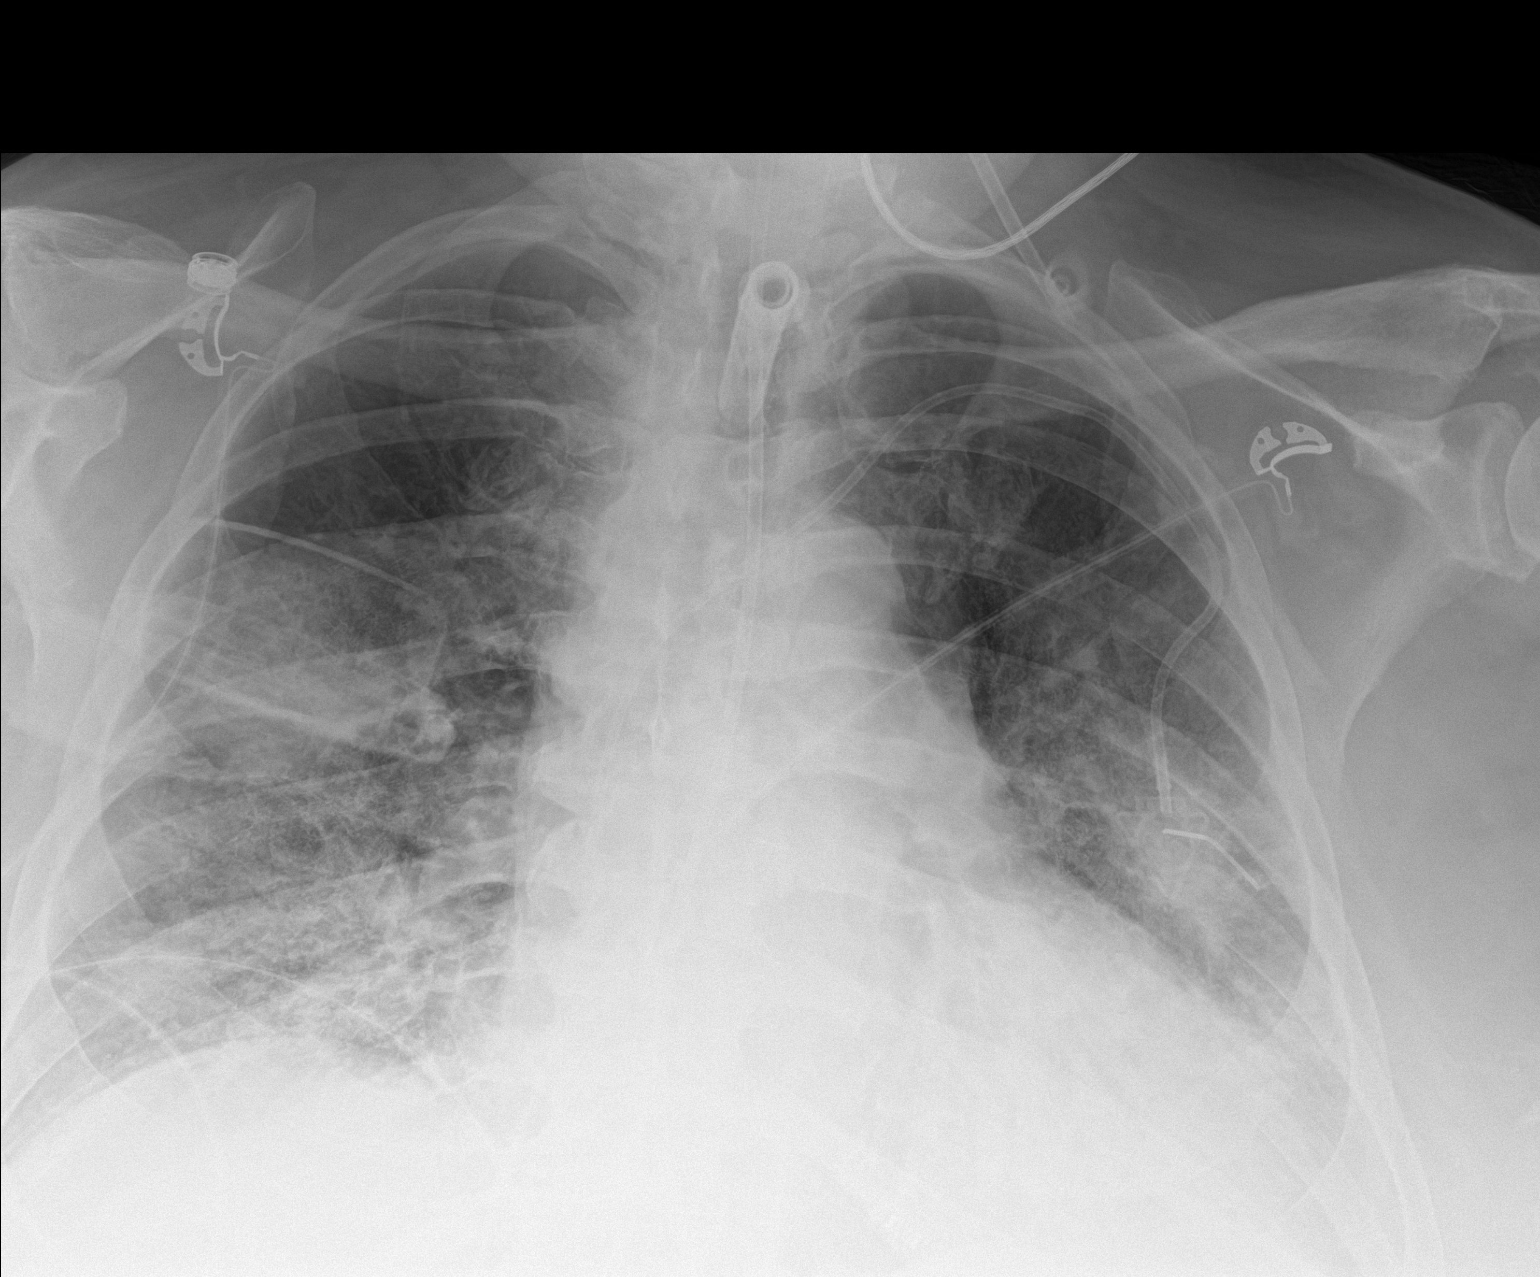

[1 of 1 positions shown; findings below may reference images not displayed]

FINDINGS: Feeding tube that at least reaches the stomach. Porta catheter on
the left with tip the stomach at the SVC. Tracheostomy tube is well
seated. Stable cardiomegaly and extensive airspace opacity. No
visible effusion or pneumothorax.
IMPRESSION: 1. History of viral pneumonitis. Bilateral airspace disease is
unchanged from yesterday.
2. Cardiomegaly.

## 2018-11-20 ENCOUNTER — Encounter: Payer: Self-pay | Admitting: Cardiology

## 2018-11-20 ENCOUNTER — Ambulatory Visit: Payer: BC Managed Care – PPO | Admitting: Cardiology

## 2018-11-20 ENCOUNTER — Other Ambulatory Visit: Payer: Self-pay

## 2018-11-20 VITALS — BP 152/82 | HR 94 | Ht 71.0 in | Wt 368.0 lb

## 2018-11-20 DIAGNOSIS — I420 Dilated cardiomyopathy: Secondary | ICD-10-CM | POA: Diagnosis not present

## 2018-11-20 DIAGNOSIS — I482 Chronic atrial fibrillation, unspecified: Secondary | ICD-10-CM | POA: Diagnosis not present

## 2018-11-20 MED ORDER — KLOR-CON M20 20 MEQ PO TBCR: 20.0000 meq | EXTENDED_RELEASE_TABLET | Freq: Two times a day (BID) | ORAL | 3 refills | Status: DC

## 2018-11-20 MED ORDER — KLOR-CON M20 20 MEQ PO TBCR: 40.0000 meq | EXTENDED_RELEASE_TABLET | Freq: Two times a day (BID) | ORAL | 3 refills | Status: DC

## 2018-11-20 MED ORDER — FUROSEMIDE 20 MG PO TABS: 40.0000 mg | ORAL_TABLET | Freq: Every day | ORAL | 3 refills | Status: DC

## 2018-11-20 NOTE — Patient Instructions (Addendum)
Medication Instructions:  INCREASE FUROSEMIDE TO 40 MG ONCE DAILY= 2 OF THE 20 MG TABLETS ONCE DAILY  INCREASE K-DUR TO 2 TABLET TWICE DAILY  *If you need a refill on your cardiac medications before your next appointment, please call your pharmacy*  Lab Work: Your physician recommends that you GET BMP AND BNP WITH MEDICAL DOCTOR  If you have labs (blood work) drawn today and your tests are completely normal, you will receive your results only by: [name] Kitchen MyChart Message (if you have MyChart) OR . A paper copy in the mail If you have any lab test that is abnormal or we need to change your treatment, we will call you to review the results.  Testing/Procedures: Your physician has requested that you have an echocardiogram. Echocardiography is a painless test that uses sound waves to create images of your heart. It provides your doctor with information about the size and shape of your heart and how well your heart's chambers and valves are working. This procedure takes approximately one hour. There are no restrictions for this procedure.[place]    Follow-Up: At [facility], you and your health needs are our priority.  As part of our continuing mission to provide you with exceptional heart care, we have created designated Provider Care Teams.  These Care Teams include your primary Cardiologist (physician) and Advanced Practice Providers (APPs -  Physician Assistants and Nurse Practitioners) who all work together to provide you with the care you need, when you need it.  Your next appointment:   1 MONTH  The format for your next appointment:   In Person  Provider:   Minus Breeding, MD

## 2018-11-23 ENCOUNTER — Other Ambulatory Visit (INDEPENDENT_AMBULATORY_CARE_PROVIDER_SITE_OTHER): Payer: BC Managed Care – PPO

## 2018-11-23 DIAGNOSIS — I5021 Acute systolic (congestive) heart failure: Secondary | ICD-10-CM

## 2018-11-23 DIAGNOSIS — I429 Cardiomyopathy, unspecified: Secondary | ICD-10-CM

## 2018-11-23 DIAGNOSIS — I482 Chronic atrial fibrillation, unspecified: Secondary | ICD-10-CM

## 2018-11-23 DIAGNOSIS — I4891 Unspecified atrial fibrillation: Secondary | ICD-10-CM

## 2018-11-23 DIAGNOSIS — I959 Hypotension, unspecified: Secondary | ICD-10-CM | POA: Diagnosis not present

## 2018-11-23 DIAGNOSIS — I420 Dilated cardiomyopathy: Secondary | ICD-10-CM | POA: Diagnosis not present

## 2018-11-23 DIAGNOSIS — I10 Essential (primary) hypertension: Secondary | ICD-10-CM

## 2018-11-23 IMAGING — DX DG CHEST 1V PORT
1 series · 1 of 1 positions shown · non-contrast
Comparison: 02/27/2017

CLINICAL DATA: Acute respiratory failure

EXAM:
PORTABLE CHEST 1 VIEW

[chest ap]
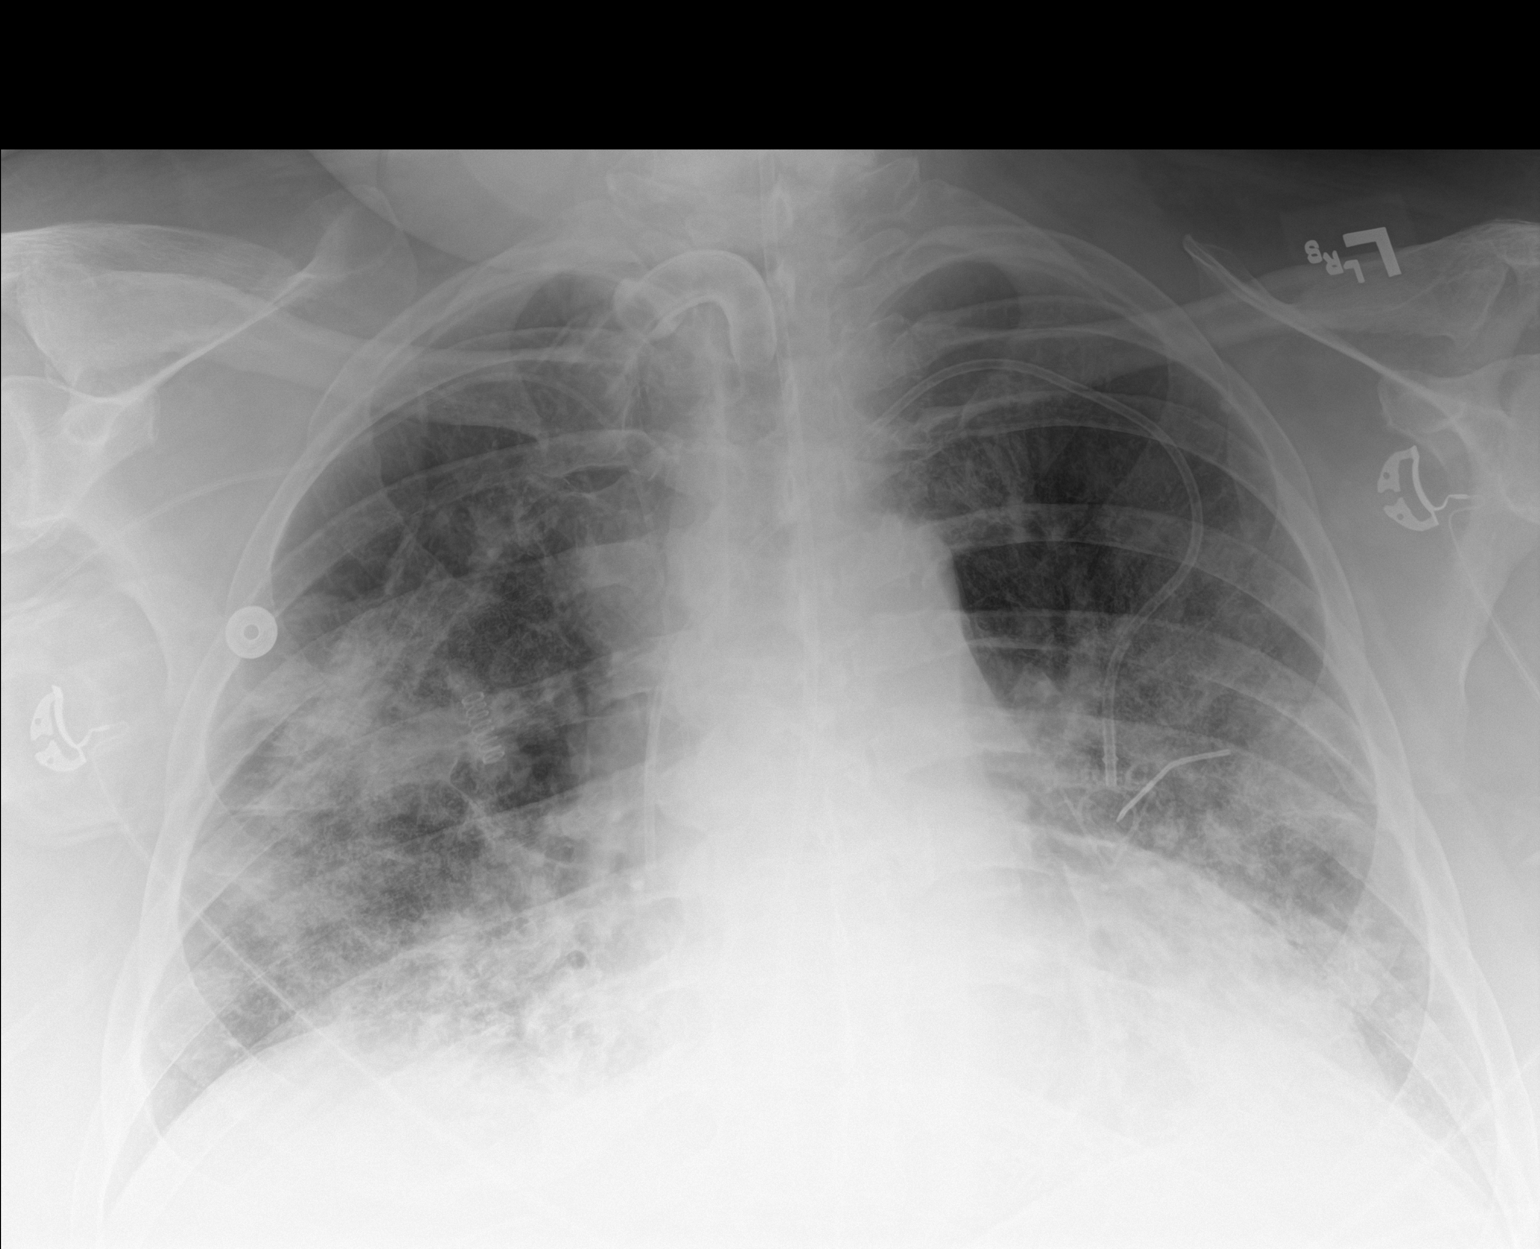

[1 of 1 positions shown; findings below may reference images not displayed]

FINDINGS: Support devices are stable. Patchy severe bilateral airspace disease
again noted, unchanged. Low lung volumes. Mild cardiomegaly.
IMPRESSION: Low lung volumes with bilateral airspace opacities. No significant
change.

## 2018-11-23 NOTE — Addendum Note (Signed)
Addended by: Crissie Reese on: 11/23/2018 02:21 PM   Modules accepted: Orders

## 2018-11-24 IMAGING — DX DG CHEST 1V PORT
1 series · 1 of 1 positions shown · non-contrast
Comparison: 02/28/2017

CLINICAL DATA: Respiratory failure

EXAM:
PORTABLE CHEST 1 VIEW

[chest]
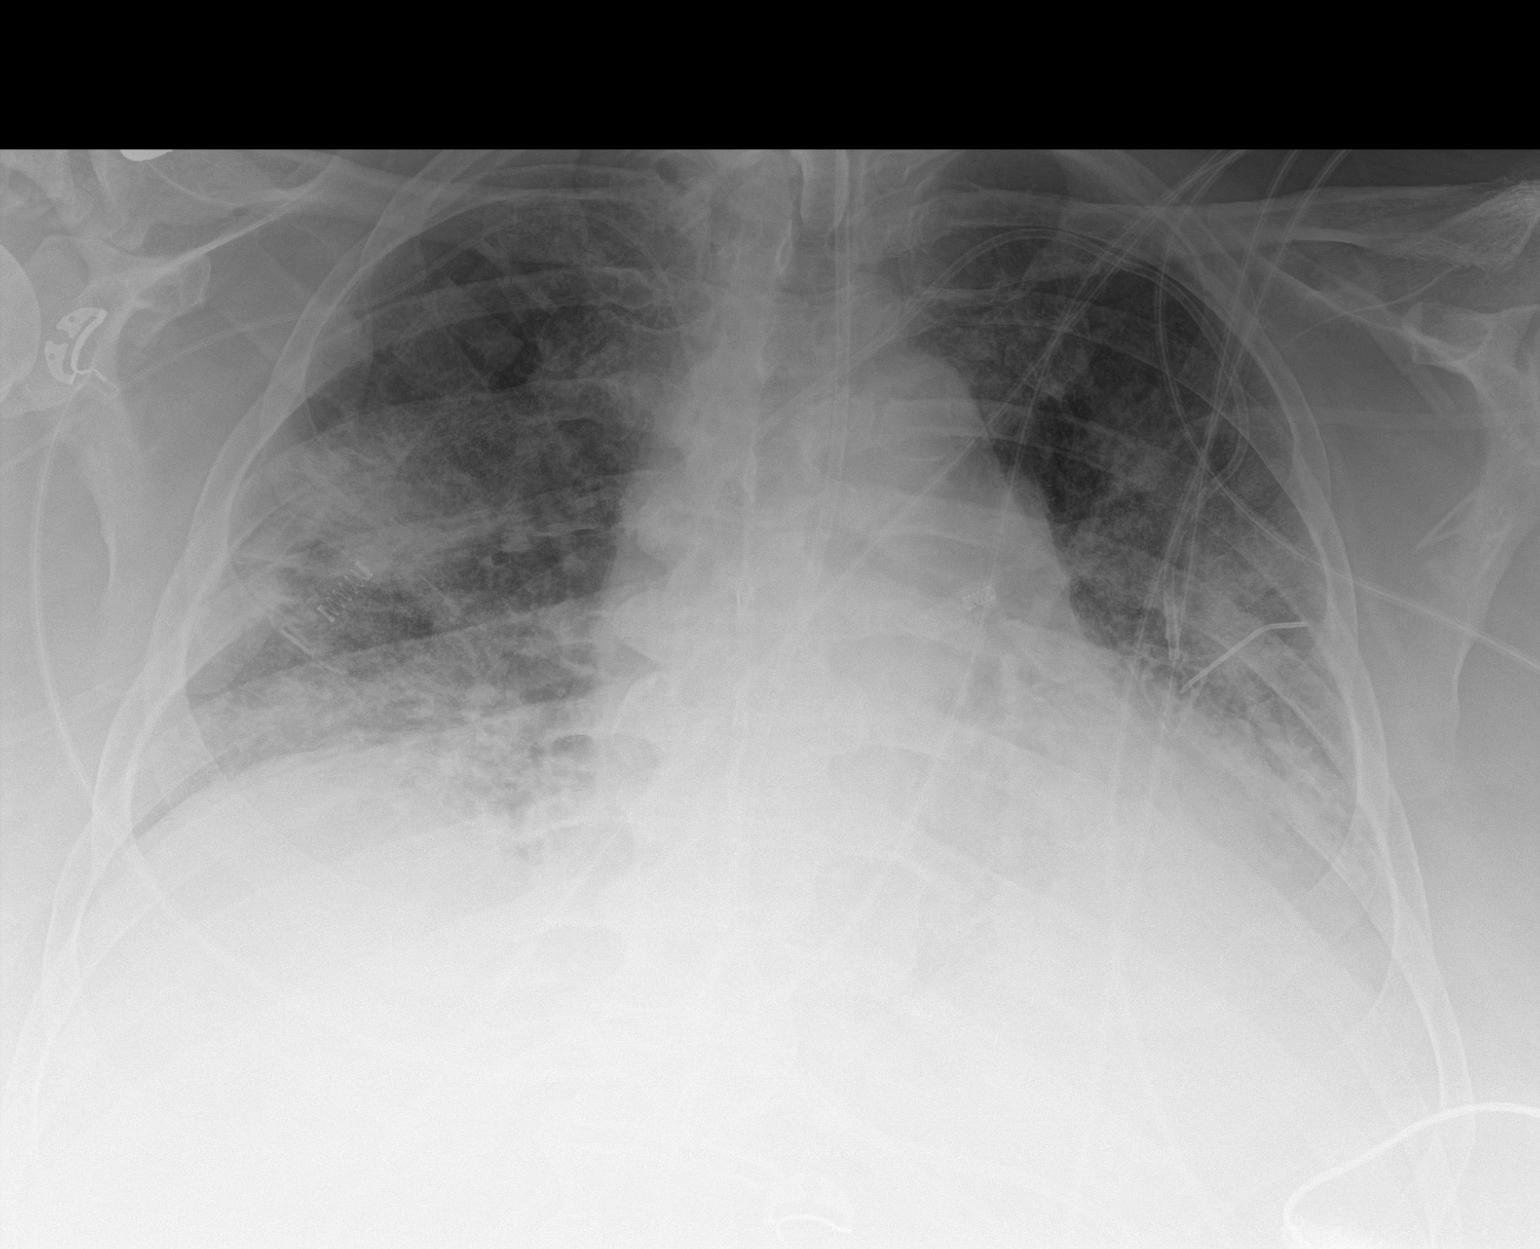

[1 of 1 positions shown; findings below may reference images not displayed]

FINDINGS: Cardiac shadow is enlarged but stable. Tracheostomy tube and feeding
catheter are again seen. Left-sided chest wall port and right-sided
PICC line are noted as well. Patchy infiltrates are again identified
bilaterally and stable. No new focal abnormality is seen.
IMPRESSION: Stable patchy infiltrates bilaterally.

Tubes and lines as described.

## 2018-11-26 ENCOUNTER — Other Ambulatory Visit: Payer: Self-pay

## 2018-11-26 ENCOUNTER — Encounter: Payer: Self-pay | Admitting: Podiatry

## 2018-11-26 ENCOUNTER — Ambulatory Visit: Payer: BC Managed Care – PPO | Admitting: Podiatry

## 2018-11-26 VITALS — Temp 96.7°F

## 2018-11-26 DIAGNOSIS — L97529 Non-pressure chronic ulcer of other part of left foot with unspecified severity: Secondary | ICD-10-CM

## 2018-11-29 DIAGNOSIS — E7849 Other hyperlipidemia: Secondary | ICD-10-CM | POA: Diagnosis not present

## 2018-11-29 DIAGNOSIS — Z0000 Encounter for general adult medical examination without abnormal findings: Secondary | ICD-10-CM | POA: Diagnosis not present

## 2018-11-29 DIAGNOSIS — Z125 Encounter for screening for malignant neoplasm of prostate: Secondary | ICD-10-CM | POA: Diagnosis not present

## 2018-11-29 DIAGNOSIS — I10 Essential (primary) hypertension: Secondary | ICD-10-CM | POA: Diagnosis not present

## 2018-11-29 DIAGNOSIS — I480 Paroxysmal atrial fibrillation: Secondary | ICD-10-CM | POA: Diagnosis not present

## 2018-12-03 DIAGNOSIS — Z1331 Encounter for screening for depression: Secondary | ICD-10-CM | POA: Diagnosis not present

## 2018-12-03 DIAGNOSIS — E785 Hyperlipidemia, unspecified: Secondary | ICD-10-CM | POA: Diagnosis not present

## 2018-12-03 DIAGNOSIS — Z0000 Encounter for general adult medical examination without abnormal findings: Secondary | ICD-10-CM | POA: Diagnosis not present

## 2018-12-03 DIAGNOSIS — I10 Essential (primary) hypertension: Secondary | ICD-10-CM | POA: Diagnosis not present

## 2018-12-03 DIAGNOSIS — I2510 Atherosclerotic heart disease of native coronary artery without angina pectoris: Secondary | ICD-10-CM | POA: Diagnosis not present

## 2018-12-03 DIAGNOSIS — R7301 Impaired fasting glucose: Secondary | ICD-10-CM | POA: Diagnosis not present

## 2018-12-03 NOTE — Progress Notes (Signed)
Subjective: 63 year old male presents the office today for follow-up evaluation of a wound of his left foot.  He states that over the weekend he notices some amount of blood coming from the area but since then he has had no further drainage.  Denies any pus.  He thinks that overall the wounds have been looking better since we started treatment.  Is been putting Santyl on both of the wounds as he ran out the [place]. Denies any fevers, chills, nausea, vomiting.  Denies any calf pain, chest pain, shortness of breath.      Objective: AAO x3, NAD DP/PT pulses palpable bilaterally, CRT less than 3 seconds Ulceration continues on the plantar medial aspect of the left hallux as well as the dorsal aspect left second toe.  The wound the dorsal second toe is less fibrotic and more granulation tissue present.  The wound after debridement on the hallux measures 2 x 1 x 0.2 cm.  Dorsal left second toe is 1 x 0.5 cm and is superficial.  Fibrotic tissue present prior to debridement.  There is no significant edema and there is no erythema or warmth of the foot. No pain with calf compression, swelling, warmth, erythema   Assessment: 63 year old male left chronic ulcerations  Plan: -All treatment options discussed with the patient including all alternatives, risks, complications.  -Today I recommended wound care evaluation consultation but he declined this.  Sharply debrided both wounds to the any complications or bleeding down healthy tissue.  Continue Prisma dressing changes on the hallux as well as Santyl to the second toe.  Offloading at all times. -Monitor for any clinical signs or symptoms of infection and directed to call the office immediately should any occur or go to the ER.  Return in about 3 weeks (around 12/17/2018) for wound check .  Trula Slade DPM

## 2018-12-05 DIAGNOSIS — G4733 Obstructive sleep apnea (adult) (pediatric): Secondary | ICD-10-CM | POA: Diagnosis not present

## 2018-12-10 ENCOUNTER — Ambulatory Visit (HOSPITAL_COMMUNITY): Payer: BC Managed Care – PPO | Attending: Cardiology

## 2018-12-10 ENCOUNTER — Other Ambulatory Visit: Payer: Self-pay

## 2018-12-10 DIAGNOSIS — Z23 Encounter for immunization: Secondary | ICD-10-CM | POA: Diagnosis not present

## 2018-12-10 DIAGNOSIS — R7301 Impaired fasting glucose: Secondary | ICD-10-CM | POA: Diagnosis not present

## 2018-12-10 DIAGNOSIS — I420 Dilated cardiomyopathy: Secondary | ICD-10-CM | POA: Insufficient documentation

## 2018-12-17 ENCOUNTER — Ambulatory Visit: Payer: BC Managed Care – PPO | Admitting: Podiatry

## 2018-12-17 ENCOUNTER — Encounter: Payer: Self-pay | Admitting: Podiatry

## 2018-12-17 ENCOUNTER — Other Ambulatory Visit: Payer: Self-pay

## 2018-12-17 DIAGNOSIS — L97529 Non-pressure chronic ulcer of other part of left foot with unspecified severity: Secondary | ICD-10-CM | POA: Diagnosis not present

## 2018-12-18 NOTE — Progress Notes (Signed)
Virtual Visit via Telephone Note   This visit type was conducted due to national recommendations for restrictions regarding the COVID-19 Pandemic (e.g. social distancing) in an effort to limit this patient's exposure and mitigate transmission in our community.  Due to his co-morbid illnesses, this patient is at least at moderate risk for complications without adequate follow up.  This format is felt to be most appropriate for this patient at this time.  The patient did not have access to video technology/had technical difficulties with video requiring transitioning to audio format only (telephone).  All issues noted in this document were discussed and addressed.  No physical exam could be performed with this format.  Please refer to the patient's chart for his  consent to telehealth for [facility].   Date:  12/20/2018   ID:  [name], DOB [birth year], MRN [id number]  Patient Location: Home Provider Location: Home  PCP:  [name], MD  Cardiologist:  Minus Breeding, MD  Electrophysiologist:  None   Evaluation Performed:  Follow-Up Visit  Chief Complaint: Follow Up  History of Present Illness:    [name] is a 63 y.o. male we are following for ongoing assessment and management of cardiomyopathy.  He has chronic shortness of breath.   He was last seen by Dr. Percival Spanish on 11/20/2018.  He has a past history of respiratory failure related to arts/influenza A with a reduced EF of 25% which was new, and new onset atrial fibrillation.    He is currently on apixaban and carvedilol. He spent a good bit of time in a rehabilitation facility with a follow-up echocardiogram July 2020 with return to normal EF.  He also has a history of OSA and is followed by respiratory, Dr. Kyung Rudd, for OSA and COPD.  Additional history includes hypertension.  Echocardiogram was ordered when last seen.  Echocardiogram dated 12/10/2018 revealed mildly decreased global systolic function with EF of 45% to 50%.   This was a difficult study in the setting of COPD.  He was found to have pulmonary hypertension with RV systolic pressure at 0000000.   On last office visit with Dr. Percival Spanish on 11/20/2018, he appeared to have some fluid overload and Lasix was increased to 40 mg daily. He was to have blood work on follow up.  [name] is without complaint today.  He stated that he lost approximately 15 pounds on higher dose of Lasix.  He is breathing better, denies any lower extremity edema.  He also states that he has been avoiding salt.  He states his blood pressures running in the 130s over 80s.  He did not take his blood pressure today.  He denies any chest pain, dyspnea on exertion, or excessive fatigue.  He is medically compliant.  He did get his labs completed through his primary care physician, Dr. Brigitte Pulse, we will request those results.   The patient does not have symptoms concerning for COVID-19 infection (fever, chills, cough, or new shortness of breath).    Past Medical History:  Diagnosis Date  . Acute systolic HF (heart failure) ([place])   . Atrial fibrillation ([place])   . Diabetes mellitus, new onset ([place])   . Dyslipidemia   . Gout attack 01/16/2012  . Hypertension   . Lymphoma ([place])   . NHL dx'd 01/2010  . Venous insufficiency   . Venous stasis ulcers ([facility])    Past Surgical History:  Procedure Laterality Date  . BRONCHOSCOPY    . ESOPHAGOGASTRODUODENOSCOPY ENDOSCOPY  Dr Jearld Fenton  . INGUINAL HERNIA REPAIR     2001  . PORTA CATH INSERTION  2011     Current Meds  Medication Sig  . acetaminophen (TYLENOL) 325 MG tablet Take 650 mg by mouth every 6 (six) hours as needed.  [name] Kitchen albuterol (PROVENTIL HFA;VENTOLIN HFA) 108 (90 Base) MCG/ACT inhaler Inhale 2 puffs into the lungs every 6 (six) hours as needed for wheezing or shortness of breath.  . allopurinol (ZYLOPRIM) 300 MG tablet Take 300 mg by mouth daily.  [name] Kitchen apixaban (ELIQUIS) 5 MG TABS tablet Take 1 tablet (5 mg total) by mouth 2 (two) times  daily.  [name] Kitchen atorvastatin (LIPITOR) 20 MG tablet TAKE 1 TABLET BY MOUTH EVERY DAY *PT OVERDUE FOR OFFICE VISIT*  . becaplermin (REGRANEX) 0.01 % gel Apply 1 application topically daily. Measurements right 1st toe 1.6 x 0.6 x 0.1cm  . carvedilol (COREG) 25 MG tablet TAKE 1 TABLET (25 MG TOTAL) BY MOUTH 2 (TWO) TIMES DAILY WITH A MEAL.  . collagenase (SANTYL) ointment Apply 1 application topically daily. Measurements to right hallux 1.6 x 0.6 x 0.1cm for use until Regranex arrives.  . [employer] DIGESTIVE PROBIOTIC 250 MG capsule Take 500 mg by mouth daily.  [name] Kitchen doxycycline (VIBRA-TABS) 100 MG tablet Take 1 tablet (100 mg total) by mouth 2 (two) times daily.  . febuxostat (ULORIC) 40 MG tablet Take 1 tablet (40 mg total) by mouth daily.  . furosemide (LASIX) 20 MG tablet Take 2 tablets (40 mg total) by mouth daily.  [name] Kitchen KLOR-CON M20 20 MEQ tablet Take 2 tablets (40 mEq total) by mouth 2 (two) times daily.  [name] Kitchen losartan (COZAAR) 25 MG tablet TAKE 1 TABLET BY MOUTH EVERY DAY  . Melatonin 3 MG TABS Take 1 tablet by mouth at bedtime.  . Multiple Vitamin (MULTIVITAMIN WITH MINERALS) TABS tablet Take 1 tablet by mouth daily.  . mupirocin ointment (BACTROBAN) 2 % Apply 1 application topically 2 (two) times daily.  [name] Kitchen olmesartan-hydrochlorothiazide (BENICAR HCT) 40-25 MG tablet Take 1 tablet by mouth daily.  [name] Kitchen omeprazole (PRILOSEC) 40 MG capsule Take by mouth daily.  . Probiotic Product (PROBIOTIC DAILY PO) Take by mouth.  . sertraline (ZOLOFT) 50 MG tablet Take 1 tablet (50 mg total) by mouth daily.  . sodium chloride (OCEAN) 0.65 % SOLN nasal spray Place 2 sprays into both nostrils as needed for congestion.  . vitamin C (VITAMIN C) 500 MG tablet Take 1 tablet (500 mg total) by mouth 3 (three) times daily.  . [DISCONTINUED] omeprazole (PRILOSEC) 20 MG capsule TAKE 1 CAPSULE BY MOUTH EVERY DAY     Allergies:   Latex and Penicillins   Social History   Tobacco Use  . Smoking status: Former Smoker    Packs/day: 0.50     Years: 20.00    Pack years: 10.00    Quit date: 01/10/2009    Years since quitting: 9.9  . Smokeless tobacco: Never Used  Substance Use Topics  . Alcohol use: No  . Drug use: No     Family Hx: The patient's family history includes Dementia in his brother. There is no history of Colon cancer, Pancreatic cancer, Stomach cancer, Rectal cancer, or Liver cancer.  ROS:   Please see the history of present illness.    All other systems reviewed and are negative.   Prior CV studies:   The following studies were reviewed today: Echocardiogram: 12/10/2018 1. Technically difficult study. Left ventricular ejection fraction, by visual estimation, is grossly 45 to 50%.  There appears to be mildly decreased global systolic function. Images are not sufficient to assess for regional wall motion abnormalities.  There is mildly increased left ventricular hypertrophy.  2. Left ventricular diastolic parameters are indeterminate.  3. Global right ventricle has mildly reduced systolic function.The right ventricular size is mildly enlarged.  4. Left atrial size was mildly dilated.  5. Right atrial size was normal.  6. The mitral valve is normal in structure. Trace mitral valve regurgitation.  7. The tricuspid valve is not well visualized. Tricuspid valve regurgitation is trivial.  8. The aortic valve is tricuspid. Aortic valve regurgitation is not visualized.  9. The pulmonic valve was not well visualized. Pulmonic valve regurgitation is not visualized. 10. There is dilatation of the aortic root measuring 40 mm. 11. The inferior vena cava is dilated in size with <50% respiratory variability, suggesting right atrial pressure of 15 mmHg. 12. The tricuspid regurgitant velocity is 2.79 m/s, and with an assumed right atrial pressure of 15 mmHg, the estimated right ventricular systolic pressure is moderately elevated at 46.1 mmHg.  Labs/Other Tests and Data Reviewed:    EKG:  No ECG reviewed.  Recent Labs:  01/08/2018: ALT 21; BUN 16; Creatinine 0.84; Hemoglobin 13.4; Platelet Count 207; Potassium 4.2; Sodium 139   Recent Lipid Panel Lab Results  Component Value Date/Time   TRIG 122 02/26/2017 01:06 PM    Wt Readings from Last 3 Encounters:  12/20/18 (!) 352 lb (159.7 kg)  11/20/18 (!) 368 lb (166.9 kg)  04/10/18 (!) 340 lb (154.2 kg)     Objective:    Vital Signs:  Ht 5\' 11"  (1.803 m)   Wt (!) 352 lb (159.7 kg)   BMI 49.09 kg/m    VITAL SIGNS:  reviewed GEN:  no acute distress PSYCH:  normal affect Nio assessment as this was telephone visit.  ASSESSMENT & PLAN:    1.  Nonischemic cardiomyopathy: Most recent echocardiogram revealed significant improvement in his ejection fraction from 25% to 45% to 50%.  The patient is medically compliant, feels much better concerning his breathing status, and has lost 15 pounds of fluid with increased dose of Lasix.  He is doing his best to reduce his salt intake.  He will continue current medication regimen to include carvedilol 25 mg twice daily, Lasix 40 mg daily with potassium supplement.  I have answered questions concerning his ejection fraction and explained his echocardiogram to him.  He will continue daily weights, take extra dose of Lasix and potassium should he gain 3 to 5 pounds in 24 to 48 hours.  He verbalizes understanding.  2.  Hypertension: [name] is on carvedilol 25 mg twice daily, losartan 25 mg daily, no longer taking Benicar.  He reports that his blood pressure is running in the 130s over 80s.  He tries to remember to take his blood pressure daily.  He states that it is not been elevated.  He will continue current medication regimen.  3.  Obstructive sleep apnea: He is being followed by his pulmonologist for this along with ongoing management of respiratory issues.  He is due to see pulmonologist tomorrow.  He offers no complaints at this time of any worsening shortness of breath or wheezing or PND.  4.  History of atrial  fibrillation: Unable to assess for irregular heart rate as this is a telephone visit.  He continues on apixaban 5 mg twice daily.  He denies any rapid heart rhythms.  Labs have recently been drawn by his  primary care physician Dr. Brigitte Pulse, will try to obtain these for review.  COVID-19 Education: The signs and symptoms of COVID-19 were discussed with the patient and how to seek care for testing (follow up with PCP or arrange E-visit).  The importance of social distancing was discussed today.  Time:   Today, I have spent 15 minutes with the patient with telehealth technology discussing the above problems.  He will follow-up with Dr. Percival Spanish in 6 months but knows to call us sooner if he is having symptoms.   Medication Adjustments/Labs and Tests Ordered: Current medicines are reviewed at length with the patient today.  Concerns regarding medicines are outlined above.  Discussion of ejection fraction and echocardiogram results were also completed.  Tests Ordered: No orders of the defined types were placed in this encounter.   Medication Changes: No orders of the defined types were placed in this encounter.   Disposition:  Follow up 6 months.  Signed, Phill Myron. West Pugh, ANP, AACC  12/20/2018 9:00 AM    [facility]

## 2018-12-20 ENCOUNTER — Telehealth (INDEPENDENT_AMBULATORY_CARE_PROVIDER_SITE_OTHER): Payer: BC Managed Care – PPO | Admitting: Adult Health

## 2018-12-20 ENCOUNTER — Ambulatory Visit: Payer: BC Managed Care – PPO | Admitting: Cardiology

## 2018-12-20 ENCOUNTER — Encounter: Payer: Self-pay | Admitting: Adult Health

## 2018-12-20 VITALS — Ht 71.0 in | Wt 352.0 lb

## 2018-12-20 DIAGNOSIS — I482 Chronic atrial fibrillation, unspecified: Secondary | ICD-10-CM

## 2018-12-20 DIAGNOSIS — I428 Other cardiomyopathies: Secondary | ICD-10-CM

## 2018-12-20 DIAGNOSIS — I4891 Unspecified atrial fibrillation: Secondary | ICD-10-CM

## 2018-12-20 DIAGNOSIS — Z7901 Long term (current) use of anticoagulants: Secondary | ICD-10-CM

## 2018-12-20 DIAGNOSIS — G4733 Obstructive sleep apnea (adult) (pediatric): Secondary | ICD-10-CM | POA: Diagnosis not present

## 2018-12-20 DIAGNOSIS — I43 Cardiomyopathy in diseases classified elsewhere: Secondary | ICD-10-CM

## 2018-12-20 DIAGNOSIS — I10 Essential (primary) hypertension: Secondary | ICD-10-CM | POA: Diagnosis not present

## 2018-12-20 NOTE — Patient Instructions (Signed)
Medication Instructions:  Continue current medications  If you need a refill on your cardiac medications before your next appointment, please call your pharmacy.  Labwork: None Ordered   Testing/Procedures: None Ordered  Follow-Up: IN 4 months Please call our office 2 months in advance,  to schedule this  appointment. In Person Minus Breeding, MD.    At [facility], you and your health needs are our priority.  As part of our continuing mission to provide you with exceptional heart care, we have created designated Provider Care Teams.  These Care Teams include your primary Cardiologist (physician) and Advanced Practice Providers (APPs -  Physician Assistants and Nurse Practitioners) who all work together to provide you with the care you need, when you need it.  Thank you for choosing [facility] at [facility]!!        Happy Holidays!!

## 2018-12-24 NOTE — Progress Notes (Signed)
Subjective: 63 year old male presents the office today for follow-up evaluation of a wound of his left foot.  He states that the wound in the second toe is doing better and he thinks that one of the big toe is also doing better.  Denies any drainage or pus.  He states that he could not stay off of his feet and he believes this is contributing factor in why the wounds have not been healing.  He denies any drainage or pus.  He denies any swelling or redness.  He has been keeping Santyl on the wound on the second toe as well as Prisma on the big toe. Denies any fevers, chills, nausea, vomiting.  Denies any calf pain, chest pain, shortness of breath.   Objective: AAO x3, NAD DP/PT pulses palpable bilaterally, CRT less than 3 seconds Ulceration continues on the plantar medial aspect of the left hallux as well as the dorsal aspect left second toe.  The wound the dorsal second toe is less fibrotic and more granulation tissue present.  The wound after debridement on the hallux measures 1.8 x 1 x 0.2 cm with a granular wound base.  Dorsal left second toe is 0.8 x 0.4 cm and is superficial.  There is less fibrotic tissue present prior to debridement.  There is no significant edema and there is no erythema or warmth of the foot. No pain with calf compression, swelling, warmth, erythema       Assessment: 63 year old male left chronic ulcerations  Plan: -All treatment options discussed with the patient including all alternatives, risks, complications.  -Sharply debrided both wounds to the any complications or bleeding down healthy tissue.  Continue Prisma dressing changes on the hallux as well as Santyl to the second toe.  Offloading at all times. -Declines wound care consult. -Monitor for any clinical signs or symptoms of infection and directed to call the office immediately should any occur or go to the ER.  Return in about 4 weeks (around 01/14/2019).  He cannot come in sooner due to work schedule and  holidays  Trula Slade DPM

## 2018-12-29 ENCOUNTER — Other Ambulatory Visit: Payer: Self-pay | Admitting: Cardiology

## 2018-12-31 NOTE — Telephone Encounter (Signed)
Refill Request.  

## 2019-01-07 ENCOUNTER — Inpatient Hospital Stay: Payer: BC Managed Care – PPO | Attending: Internal Medicine

## 2019-01-07 ENCOUNTER — Inpatient Hospital Stay: Payer: BC Managed Care – PPO | Admitting: Internal Medicine

## 2019-01-07 ENCOUNTER — Other Ambulatory Visit: Payer: Self-pay

## 2019-01-07 ENCOUNTER — Encounter: Payer: Self-pay | Admitting: Internal Medicine

## 2019-01-07 VITALS — BP 154/88 | HR 89 | Temp 98.5°F | Resp 20 | Ht 71.0 in | Wt 359.0 lb

## 2019-01-07 DIAGNOSIS — C8212 Follicular lymphoma grade II, intrathoracic lymph nodes: Secondary | ICD-10-CM

## 2019-01-07 DIAGNOSIS — I110 Hypertensive heart disease with heart failure: Secondary | ICD-10-CM | POA: Insufficient documentation

## 2019-01-07 DIAGNOSIS — Z9221 Personal history of antineoplastic chemotherapy: Secondary | ICD-10-CM | POA: Diagnosis not present

## 2019-01-07 DIAGNOSIS — E1165 Type 2 diabetes mellitus with hyperglycemia: Secondary | ICD-10-CM | POA: Insufficient documentation

## 2019-01-07 DIAGNOSIS — R0602 Shortness of breath: Secondary | ICD-10-CM | POA: Diagnosis not present

## 2019-01-07 DIAGNOSIS — I4891 Unspecified atrial fibrillation: Secondary | ICD-10-CM | POA: Diagnosis not present

## 2019-01-07 DIAGNOSIS — C8210 Follicular lymphoma grade II, unspecified site: Secondary | ICD-10-CM | POA: Diagnosis not present

## 2019-01-07 LAB — CMP (CANCER CENTER ONLY)
ALT: 14 U/L (ref 0–44)
AST: 16 U/L (ref 15–41)
Albumin: 3.8 g/dL (ref 3.5–5.0)
Alkaline Phosphatase: 150 U/L — ABNORMAL HIGH (ref 38–126)
Anion gap: 10 (ref 5–15)
BUN: 18 mg/dL (ref 8–23)
CO2: 25 mmol/L (ref 22–32)
Calcium: 9.6 mg/dL (ref 8.9–10.3)
Chloride: 104 mmol/L (ref 98–111)
Creatinine: 0.87 mg/dL (ref 0.61–1.24)
GFR, Est AFR Am: >60 mL/min (ref >60)
GFR, Estimated: >60 mL/min (ref >60)
Glucose, Bld: 103 mg/dL — ABNORMAL HIGH (ref 70–99)
Potassium: 4.1 mmol/L (ref 3.5–5.1)
Sodium: 139 mmol/L (ref 135–145)
Total Bilirubin: 0.8 mg/dL (ref 0.3–1.2)
Total Protein: 7.7 g/dL (ref 6.5–8.1)

## 2019-01-07 LAB — CBC WITH DIFFERENTIAL (CANCER CENTER ONLY)
Abs Immature Granulocytes: 0.05 10*3/uL (ref 0.00–0.07)
Basophils Absolute: 0.1 10*3/uL (ref 0.0–0.1)
Basophils Relative: 1 %
Eosinophils Absolute: 0.3 10*3/uL (ref 0.0–0.5)
Eosinophils Relative: 3 %
HCT: 46.4 % (ref 39.0–52.0)
Hemoglobin: 15.1 g/dL (ref 13.0–17.0)
Immature Granulocytes: 1 %
Lymphocytes Relative: 17 %
Lymphs Abs: 1.7 10*3/uL (ref 0.7–4.0)
MCH: 30.7 pg (ref 26.0–34.0)
MCHC: 32.5 g/dL (ref 30.0–36.0)
MCV: 94.3 fL (ref 80.0–100.0)
Monocytes Absolute: 1 10*3/uL (ref 0.1–1.0)
Monocytes Relative: 9 %
Neutro Abs: 7.1 10*3/uL (ref 1.7–7.7)
Neutrophils Relative %: 69 %
Platelet Count: 187 10*3/uL (ref 150–400)
RBC: 4.92 MIL/uL (ref 4.22–5.81)
RDW: 14.9 % (ref 11.5–15.5)
WBC Count: 10.2 10*3/uL (ref 4.0–10.5)
nRBC: 0 % (ref 0.0–0.2)

## 2019-01-07 LAB — LACTATE DEHYDROGENASE: LDH: 203 U/L — ABNORMAL HIGH (ref 98–192)

## 2019-01-07 NOTE — Progress Notes (Signed)
[facility] [phone]   [phone]  OFFICE PROGRESS NOTE  [name], MD [place] [address]  DIAGNOSIS: : Stage III follicular lymphoma, grade 2, diagnosed in June 2012   PRIOR THERAPY:  1) Systemic chemotherapy with CHOP/Rituxan, status post 8 cycles, last dose was given 07/19/2010.  2)  Maintenance therapy with Rituxan 375 mg/m2 given every 2 months for a total of 2 years, status post 12 cycles.  CURRENT THERAPY: Observation.  INTERVAL HISTORY: [name] 63 y.o. male returns to the clinic today for annual follow-up visit.  The patient is feeling fine today with no concerning complaints except for fatigue.  He again several pounds recently and he is currently on Lasix for history of congestive heart failure.  The patient denied having any chest pain but has shortness of breath with exertion with no cough or hemoptysis.  He denied having any fever or chills.  He has no palpable lymphadenopathy or night sweats.  He is here today for evaluation and repeat blood work.  MEDICAL HISTORY: Past Medical History:  Diagnosis Date  . Acute systolic HF (heart failure) ([facility])   . Atrial fibrillation ([facility])   . Diabetes mellitus, new onset ([facility])   . Dyslipidemia   . Gout attack 01/16/2012  . Hypertension   . Lymphoma ([facility])   . NHL dx'd 01/2010  . Venous insufficiency   . Venous stasis ulcers ([facility])     ALLERGIES:  is allergic to latex and penicillins.  MEDICATIONS:  Current Outpatient Medications  Medication Sig Dispense Refill  . acetaminophen (TYLENOL) 325 MG tablet Take 650 mg by mouth every 6 (six) hours as needed.    [name] Kitchen albuterol (PROVENTIL HFA;VENTOLIN HFA) 108 (90 Base) MCG/ACT inhaler Inhale 2 puffs into the lungs every 6 (six) hours as needed for wheezing or shortness of breath. 1 Inhaler 2  . allopurinol (ZYLOPRIM) 300 MG tablet Take 300 mg by mouth daily.    [name] Kitchen atorvastatin (LIPITOR) 20 MG tablet TAKE 1 TABLET BY  MOUTH EVERY DAY *PT OVERDUE FOR OFFICE VISIT* 30 tablet 5  . becaplermin (REGRANEX) 0.01 % gel Apply 1 application topically daily. Measurements right 1st toe 1.6 x 0.6 x 0.1cm 15 g 5  . carvedilol (COREG) 25 MG tablet TAKE 1 TABLET (25 MG TOTAL) BY MOUTH 2 (TWO) TIMES DAILY WITH A MEAL. 60 tablet 2  . collagenase (SANTYL) ointment Apply 1 application topically daily. Measurements to right hallux 1.6 x 0.6 x 0.1cm for use until Regranex arrives. 30 g 5  . [employer] DIGESTIVE PROBIOTIC 250 MG capsule Take 500 mg by mouth daily.    [name] Kitchen doxycycline (VIBRA-TABS) 100 MG tablet Take 1 tablet (100 mg total) by mouth 2 (two) times daily. 20 tablet 0  . ELIQUIS 5 MG TABS tablet TAKE 1 TABLET BY MOUTH TWICE A DAY 180 tablet 2  . febuxostat (ULORIC) 40 MG tablet Take 1 tablet (40 mg total) by mouth daily. 30 tablet 0  . furosemide (LASIX) 20 MG tablet Take 2 tablets (40 mg total) by mouth daily. 180 tablet 3  . KLOR-CON M20 20 MEQ tablet Take 2 tablets (40 mEq total) by mouth 2 (two) times daily. 360 tablet 3  . losartan (COZAAR) 25 MG tablet TAKE 1 TABLET BY MOUTH EVERY DAY 30 tablet 2  . Melatonin 3 MG TABS Take 1 tablet by mouth at bedtime.    . Multiple Vitamin (MULTIVITAMIN WITH MINERALS) TABS tablet Take 1 tablet  by mouth daily.    . mupirocin ointment (BACTROBAN) 2 % Apply 1 application topically 2 (two) times daily. 30 g 2  . olmesartan-hydrochlorothiazide (BENICAR HCT) 40-25 MG tablet Take 1 tablet by mouth daily.    [name] Kitchen omeprazole (PRILOSEC) 40 MG capsule Take by mouth daily.    . Probiotic Product (PROBIOTIC DAILY PO) Take by mouth.    . sertraline (ZOLOFT) 50 MG tablet Take 1 tablet (50 mg total) by mouth daily. 30 tablet 0  . sodium chloride (OCEAN) 0.65 % SOLN nasal spray Place 2 sprays into both nostrils as needed for congestion.  0  . vitamin C (VITAMIN C) 500 MG tablet Take 1 tablet (500 mg total) by mouth 3 (three) times daily.     No current facility-administered medications for this visit.    Facility-Administered Medications Ordered in Other Visits  Medication Dose Route Frequency Provider Last Rate Last Admin  . sodium chloride 0.9 % injection 10 mL  10 mL Intravenous PRN Curt Bears, MD   10 mL at 05/04/15 1529    REVIEW OF SYSTEMS:  A comprehensive review of systems was negative except for: Constitutional: positive for fatigue Respiratory: positive for dyspnea on exertion   PHYSICAL EXAMINATION: General appearance: alert, cooperative and no distress Head: Normocephalic, without obvious abnormality, atraumatic Neck: no adenopathy Lymph nodes: Cervical, supraclavicular, and axillary nodes normal. Resp: clear to auscultation bilaterally Back: symmetric, no curvature. ROM normal. No CVA tenderness. Cardio: regular rate and rhythm, S1, S2 normal, no murmur, click, rub or gallop GI: soft, non-tender; bowel sounds normal; no masses,  no organomegaly Extremities: extremities normal, atraumatic, no cyanosis or edema  ECOG PERFORMANCE STATUS: 1 - Symptomatic but completely ambulatory  Blood pressure (!) 154/88, pulse 89, temperature 98.5 F (36.9 C), temperature source Temporal, resp. rate 20, height 5\' 11"  (1.803 m), weight (!) 359 lb (162.8 kg), SpO2 95 %.  LABORATORY DATA: Lab Results  Component Value Date   WBC 10.2 01/07/2019   HGB 15.1 01/07/2019   HCT 46.4 01/07/2019   MCV 94.3 01/07/2019   PLT 187 01/07/2019      Chemistry      Component Value Date/Time   NA 139 01/08/2018 1500   NA 137 05/22/2017 1550   NA 137 10/24/2016 1440   K 4.2 01/08/2018 1500   K 3.6 10/24/2016 1440   CL 107 01/08/2018 1500   CL 105 05/14/2012 0840   CO2 24 01/08/2018 1500   CO2 26 10/24/2016 1440   BUN 16 01/08/2018 1500   BUN 12 05/22/2017 1550   BUN 16.2 10/24/2016 1440   CREATININE 0.84 01/08/2018 1500   CREATININE 0.8 10/24/2016 1440      Component Value Date/Time   CALCIUM 8.9 01/08/2018 1500   CALCIUM 9.2 10/24/2016 1440   ALKPHOS 139 (H) 01/08/2018 1500    ALKPHOS 131 10/24/2016 1440   AST 22 01/08/2018 1500   AST 31 10/24/2016 1440   ALT 21 01/08/2018 1500   ALT 31 10/24/2016 1440   BILITOT 0.5 01/08/2018 1500   BILITOT 0.61 10/24/2016 1440      RADIOLOGY STUDIES: ECHOCARDIOGRAM COMPLETE  Result Date: 12/10/2018   ECHOCARDIOGRAM REPORT   Patient Name:   [name]  Date of Exam: 12/10/2018 Medical Rec #:  [id number]     Height:       71.0 in Accession #:    [id number]    Weight:       368.0 lb Date of Birth:  11/14/1955  BSA:          2.73 m Patient Age:    41 years      BP:           124/74 mmHg Patient Gender: M             HR:           92 bpm. Exam Location:  [facility] Procedure: 2D Echo, Cardiac Doppler and Color Doppler Indications:    I42.0  History:        Patient has prior history of Echocardiogram examinations, most                 recent 07/24/2017. Dilated cardiomyopathy, Arrythmias:Atrial                 Fibrillation; Risk Factors:Former Smoker and Morbid obesity.  Sonographer:    Coralyn Helling RDCS Referring Phys: [id number]  Sonographer Comments: Technically difficult study due to poor echo windows, Technically challenging study due to limited acoustic windows and patient is morbidly obese. Image acquisition challenging due to patient body habitus and Image acquisition challenging due to COPD. IMPRESSIONS  1. Technically difficult study. Left ventricular ejection fraction, by visual estimation, is grossly 45 to 50%. There appears to be mildly decreased global systolic function. Images are not sufficient to assess for regional wall motion abnormalities. There is mildly increased left ventricular hypertrophy.  2. Left ventricular diastolic parameters are indeterminate.  3. Global right ventricle has mildly reduced systolic function.The right ventricular size is mildly enlarged.  4. Left atrial size was mildly dilated.  5. Right atrial size was normal.  6. The mitral valve is normal in structure. Trace mitral valve  regurgitation.  7. The tricuspid valve is not well visualized. Tricuspid valve regurgitation is trivial.  8. The aortic valve is tricuspid. Aortic valve regurgitation is not visualized.  9. The pulmonic valve was not well visualized. Pulmonic valve regurgitation is not visualized. 10. There is dilatation of the aortic root measuring 40 mm. 11. The inferior vena cava is dilated in size with <50% respiratory variability, suggesting right atrial pressure of 15 mmHg. 12. The tricuspid regurgitant velocity is 2.79 m/s, and with an assumed right atrial pressure of 15 mmHg, the estimated right ventricular systolic pressure is moderately elevated at 46.1 mmHg. FINDINGS  Left Ventricle: Left ventricular ejection fraction, by visual estimation, is 45 to 50%. The left ventricle has mildly decreased function. There is mildly increased left ventricular hypertrophy. Left ventricular diastolic parameters are indeterminate. Right Ventricle: The right ventricular size is mildly enlarged. Right vetricular wall thickness was not assessed. Global RV systolic function is has mildly reduced systolic function. The tricuspid regurgitant velocity is 2.79 m/s, and with an assumed right atrial pressure of 15 mmHg, the estimated right ventricular systolic pressure is moderately elevated at 46.1 mmHg. Left Atrium: Left atrial size was mildly dilated. Right Atrium: Right atrial size was normal in size Pericardium: Trivial pericardial effusion is present. Mitral Valve: The mitral valve is normal in structure. Trace mitral valve regurgitation. Tricuspid Valve: The tricuspid valve is not well visualized. Tricuspid valve regurgitation is trivial. Aortic Valve: The aortic valve is tricuspid. Aortic valve regurgitation is not visualized. Pulmonic Valve: The pulmonic valve was not well visualized. Pulmonic valve regurgitation is not visualized. Aorta: Aortic dilatation noted. There is dilatation of the aortic root measuring 40 mm. Venous: The inferior  vena cava is dilated in size with less than 50% respiratory variability, suggesting right atrial pressure of  15 mmHg. IAS/Shunts: The atrial septum is grossly normal.  LEFT VENTRICLE PLAX 2D LVIDd:         5.90 cm LVIDs:         4.20 cm LV PW:         1.30 cm LV IVS:        1.30 cm LV SV:         95 ml LV SV Index:   31.73  RIGHT VENTRICLE            IVC RVSP:           39.1 mmHg  IVC diam: 2.80 cm LEFT ATRIUM             Index       RIGHT ATRIUM           Index LA diam:        5.10 cm 1.87 cm/m  RA Pressure: 8.00 mmHg LA Vol (A2C):   73.3 ml 26.83 ml/m RA Area:     23.90 cm LA Vol (A4C):   97.1 ml 35.54 ml/m RA Volume:   78.80 ml  28.84 ml/m LA Biplane Vol: 92.3 ml 33.78 ml/m  AORTIC VALVE LVOT Vmax:   75.18 cm/s LVOT Vmean:  48.840 cm/s LVOT VTI:    0.134 m  AORTA Ao Asc diam: 3.75 cm MITRAL VALVE                        TRICUSPID VALVE MV Area (PHT):                      TR Peak grad:   31.1 mmHg                                     TR Vmax:        279.00 cm/s MV Decel Time: 174 msec             Estimated RAP:  8.00 mmHg MV E velocity: 140.80 cm/s 103 cm/s RVSP:           39.1 mmHg                                      SHUNTS                                     Systemic VTI: 0.13 m  Oswaldo Milian MD Electronically signed by Oswaldo Milian MD Signature Date/Time: 12/10/2018/3:49:21 PM    Final    ASSESSMENT AND PLAN:  This is a very pleasant 63 years old white male with a stage IIIa follicular lymphoma status post treatment with CHOP/Rituxan followed by maintenance Rituxan for 2 years and has been observation for more than 5 years now. The patient is feeling fine today with no concerning complaints except for the fatigue and baseline shortness of breath with exertion secondary to congestive heart failure. Lab work performed today including CBC and comprehensive metabolic panel were unremarkable except for slightly elevated blood sugar and alkaline phosphatase. I discussed the lab results with  the patient and recommended for him to continue on observation with repeat CBC, comprehensive metabolic panel and LDH in 1 year. He was advised to call  immediately if he has any concerning symptoms in the interval. All questions were answered. The patient knows to call the clinic with any problems, questions or concerns. We can certainly see the patient much sooner if necessary.  Disclaimer: This note was dictated with voice recognition software. Similar sounding words can inadvertently be transcribed and may not be corrected upon review.

## 2019-01-08 ENCOUNTER — Telehealth: Payer: Self-pay | Admitting: Internal Medicine

## 2019-01-08 NOTE — Telephone Encounter (Signed)
Scheduled per los. Called and left msg. Mailed printout  °

## 2019-01-14 ENCOUNTER — Encounter: Payer: Self-pay | Admitting: Podiatry

## 2019-01-14 ENCOUNTER — Ambulatory Visit: Payer: BC Managed Care – PPO | Admitting: Podiatry

## 2019-01-14 ENCOUNTER — Other Ambulatory Visit: Payer: Self-pay

## 2019-01-14 ENCOUNTER — Ambulatory Visit (INDEPENDENT_AMBULATORY_CARE_PROVIDER_SITE_OTHER): Payer: BC Managed Care – PPO

## 2019-01-14 VITALS — Temp 97.9°F

## 2019-01-14 DIAGNOSIS — L97529 Non-pressure chronic ulcer of other part of left foot with unspecified severity: Secondary | ICD-10-CM

## 2019-01-14 DIAGNOSIS — M86172 Other acute osteomyelitis, left ankle and foot: Secondary | ICD-10-CM | POA: Diagnosis not present

## 2019-01-14 MED ORDER — DOXYCYCLINE HYCLATE 100 MG PO TABS: 100.0000 mg | ORAL_TABLET | Freq: Two times a day (BID) | ORAL | 0 refills | Status: DC

## 2019-01-17 ENCOUNTER — Other Ambulatory Visit: Payer: Self-pay | Admitting: Primary Care

## 2019-01-21 ENCOUNTER — Encounter: Payer: Self-pay | Admitting: *Deleted

## 2019-01-22 NOTE — Progress Notes (Signed)
Subjective: 64 year old male presents the office today for follow-up evaluation of a wound of his left foot.  He thinks the wound is doing better.  He has been keeping Silvadene on the second toe as well as Prisma on the hallux.  Denies any drainage or pus or increase in swelling or redness.  He denies any fevers, chills, nausea, vomiting.  No calf pain, chest pain, shortness of breath. Denies any fevers, chills, nausea, vomiting.  Denies any calf pain, chest pain, shortness of breath.   Objective: AAO x3, NAD DP/PT pulses palpable bilaterally, CRT less than 3 seconds Ulceration continues on the plantar medial aspect of the left hallux as well as the dorsal aspect left second toe.  Hallux when the wound does appear to be deeper and there is 1 central area that had some fibrotic tissue and upon debridement it did probe to bone all the bone appeared to be hard.  There is no increase in edema or erythema to the toe and clinically appears to be about the same however the wound has worsened.  There is no fluctuation or crepitation.  There is no undermining or tunneling.  On the second toe there is fibrotic tissue on the dorsal PIPJ and upon debridement there is no probing to bone, undermining or tunneling.  No pain with calf compression, swelling, warmth, erythema     Assessment: 64 year old male left chronic ulcerations-worsening wound left hallux  Plan: -All treatment options discussed with the patient including all alternatives, risks, complications.  -X-rays obtained reviewed.  No obvious signs of acute osteomyelitis there is no soft tissue emphysema.  Deformity present to the hallux as well as the second digit of the IPJ -Debrided the wounds on the hallux as well as the second toe today to reveal the underlying wound on the left hallux is worsened and there is probing to bone.  Discussed with him osteomyelitis.  He understand that he is at risk of amputation.  He has declined wound care  consultation.  He has been on his feet a lot recently with the holidays.  Working to start doxycycline and wearing a switch to Silvadene dressing changes daily.  He is off of this at all times and try to stay off of his foot as much as possible and keep the foot elevated. -Monitor for any clinical signs or symptoms of infection and directed to call the office immediately should any occur or go to the ER.  Return in about 1 week (around 01/21/2019) for wound check .  Trula Slade DPM

## 2019-01-28 ENCOUNTER — Other Ambulatory Visit: Payer: Self-pay

## 2019-01-28 ENCOUNTER — Other Ambulatory Visit: Payer: Self-pay | Admitting: Podiatry

## 2019-01-28 ENCOUNTER — Ambulatory Visit: Payer: BC Managed Care – PPO | Admitting: Podiatry

## 2019-01-28 DIAGNOSIS — L97529 Non-pressure chronic ulcer of other part of left foot with unspecified severity: Secondary | ICD-10-CM | POA: Diagnosis not present

## 2019-01-28 DIAGNOSIS — M86172 Other acute osteomyelitis, left ankle and foot: Secondary | ICD-10-CM

## 2019-01-28 MED ORDER — DOXYCYCLINE HYCLATE 100 MG PO TABS: 100.0000 mg | ORAL_TABLET | Freq: Two times a day (BID) | ORAL | 0 refills | Status: DC

## 2019-01-29 DIAGNOSIS — L97529 Non-pressure chronic ulcer of other part of left foot with unspecified severity: Secondary | ICD-10-CM | POA: Diagnosis not present

## 2019-01-29 NOTE — Progress Notes (Signed)
Subjective: 64 year old male presents the office today for follow-up evaluation of a wound of his left foot.  The wound is doing better and is not had any drainage or pus coming from the wound.  He is on the doxycycline and doing well with this.  He has been keeping Santyl on the second toe and ointment on the big toe as well.  He still wearing the surgical shoe with offloading.  He is on his feet a lot please try to sit more.  This is been ongoing for over a year since he was in [place]. He denies any fevers, chills, nausea, vomiting.  No calf pain, chest pain, shortness of breath. Denies any fevers, chills, nausea, vomiting.  Denies any calf pain, chest pain, shortness of breath.   Objective: AAO x3, NAD DP/PT pulses palpable bilaterally, CRT less than 3 seconds Ulceration continues on the plantar medial aspect of the left hallux as well as the dorsal aspect left second toe on the hallux ulceration there is a central area of fibrotic tissue and upon debridement there is no probing to bone identified today there is no increase in erythema there is no warmth.  There is no fluctuation or crepitation peer there is no malodor.  Ulceration of the dorsal PIPJ of the second toe.  Disagreeing the wound base there is no probing to bone, undermining or tunneling. No pain with calf compression, swelling, warmth, erythema  Assessment: 64 year old male left chronic ulcerations  Plan: -All treatment options discussed with the patient including all alternatives, risks, complications.  -Debrided the wound to the any complications down to healthy tissue.  I did take a wound culture of the hallux wound today.  For now continue doxycycline for concern of osteomyelitis.  We will repeat x-rays next appointment.  I try to put him into an offloading boot but he was not able to tolerate this and he cannot do the Darco wedge shoe.  Patient back to the surgical shoe with further offloading.  Continue daily dressing  changes. -Monitor for any clinical signs or symptoms of infection and directed to call the office immediately should any occur or go to the ER.  Return in about 1 week (around 02/04/2019).  Trula Slade DPM

## 2019-01-30 ENCOUNTER — Ambulatory Visit (INDEPENDENT_AMBULATORY_CARE_PROVIDER_SITE_OTHER): Payer: BC Managed Care – PPO | Admitting: Internal Medicine

## 2019-01-30 ENCOUNTER — Encounter: Payer: Self-pay | Admitting: Internal Medicine

## 2019-01-30 ENCOUNTER — Telehealth: Payer: Self-pay

## 2019-01-30 VITALS — BP 132/82 | HR 72 | Temp 98.5°F | Ht 71.0 in | Wt 364.8 lb

## 2019-01-30 DIAGNOSIS — Z86010 Personal history of colonic polyps: Secondary | ICD-10-CM

## 2019-01-30 DIAGNOSIS — Z6841 Body Mass Index (BMI) 40.0 and over, adult: Secondary | ICD-10-CM

## 2019-01-30 DIAGNOSIS — Z7901 Long term (current) use of anticoagulants: Secondary | ICD-10-CM | POA: Diagnosis not present

## 2019-01-30 LAB — COMPREHENSIVE METABOLIC PANEL
ALT: 16 IU/L (ref 0–44)
AST: 20 IU/L (ref 0–40)
Albumin/Globulin Ratio: 1.5 (ref 1.2–2.2)
Albumin: 4 g/dL (ref 3.8–4.8)
Alkaline Phosphatase: 177 IU/L — ABNORMAL HIGH (ref 39–117)
BUN/Creatinine Ratio: 17 (ref 10–24)
BUN: 15 mg/dL (ref 8–27)
Bilirubin Total: 0.4 mg/dL (ref 0.0–1.2)
CO2: 23 mmol/L (ref 20–29)
Calcium: 9.1 mg/dL (ref 8.6–10.2)
Chloride: 103 mmol/L (ref 96–106)
Creatinine, Ser: 0.89 mg/dL (ref 0.76–1.27)
GFR calc Af Amer: 105 mL/min/{1.73_m2} (ref >59)
GFR calc non Af Amer: 91 mL/min/{1.73_m2} (ref >59)
Globulin, Total: 2.7 g/dL (ref 1.5–4.5)
Glucose: 118 mg/dL — ABNORMAL HIGH (ref 65–99)
Potassium: 4.6 mmol/L (ref 3.5–5.2)
Sodium: 139 mmol/L (ref 134–144)
Total Protein: 6.7 g/dL (ref 6.0–8.5)

## 2019-01-30 LAB — CBC WITH DIFFERENTIAL/PLATELET
Basophils Absolute: 0.1 10*3/uL (ref 0.0–0.2)
Basos: 2 %
EOS (ABSOLUTE): 0.2 10*3/uL (ref 0.0–0.4)
Eos: 3 %
Hematocrit: 44.8 % (ref 37.5–51.0)
Hemoglobin: 14.6 g/dL (ref 13.0–17.7)
Immature Grans (Abs): 0 10*3/uL (ref 0.0–0.1)
Immature Granulocytes: 1 %
Lymphocytes Absolute: 1.6 10*3/uL (ref 0.7–3.1)
Lymphs: 23 %
MCH: 30.3 pg (ref 26.6–33.0)
MCHC: 32.6 g/dL (ref 31.5–35.7)
MCV: 93 fL (ref 79–97)
Monocytes Absolute: 0.7 10*3/uL (ref 0.1–0.9)
Monocytes: 10 %
Neutrophils Absolute: 4.4 10*3/uL (ref 1.4–7.0)
Neutrophils: 61 %
Platelets: 189 10*3/uL (ref 150–450)
RBC: 4.82 x10E6/uL (ref 4.14–5.80)
RDW: 14.5 % (ref 11.6–15.4)
WBC: 7 10*3/uL (ref 3.4–10.8)

## 2019-01-30 LAB — C-REACTIVE PROTEIN: CRP: 24 mg/L — ABNORMAL HIGH (ref 0–10)

## 2019-01-30 LAB — SEDIMENTATION RATE: Sed Rate: 49 mm/hr — ABNORMAL HIGH (ref 0–30)

## 2019-01-30 NOTE — Progress Notes (Signed)
Subjective:    Patient ID: [name], male    DOB: 07/02/1955, 64 y.o.   MRN: [id number]  HPI [name] is a 64 year old male with a history of multiple adenomatous colon polyps, history of CHF, atrial fibrillation on Eliquis, history of silent reflux and aspiration pneumonia on daily PPI, dyslipidemia, hypertension, venous stasis ulceration on doxycycline who is seen for follow-up to discuss surveillance colonoscopy.  He is here alone today.  He had a surveillance colonoscopy which I performed on 12/25/2017.  This revealed 10 adenomatous polyps ranging from 3 to 14 mm in size.  There was one hyperplastic polyp removed from the distal sigmoid colon.  1 year surveillance interval was recommended.  He reports that he has been feeling well.  No GI complaint.  No change in bowel habit.  No blood in stool or melena.  As mentioned above he was having silent reflux which led to coughing, throat clearing and aspiration pneumonia.  He has been on omeprazole and this has improved his symptoms.  No abdominal pain.  He takes Eliquis under the direction of Dr. Percival Spanish.  He is completing a course of doxycycline for an ulceration on the bottom of his left foot.   Review of Systems As per HPI, otherwise negative  Current Medications, Allergies, Past Medical History, Past Surgical History, Family History and Social History were reviewed in [employer] record.     Objective:   Physical Exam  BP 132/82   Pulse 72   Temp 98.5 F (36.9 C)   Ht 5\' 11"  (1.803 m)   Wt (!) 364 lb 12.8 oz (165.5 kg)   BMI 50.88 kg/m  Gen: awake, alert, NAD HEENT: anicteric CV: RRR, no mrg Pulm: CTA b/l Neuro: nonfocal     Assessment & Plan:  64 year old male with a history of multiple adenomatous colon polyps, history of CHF, atrial fibrillation on Eliquis, history of silent reflux and aspiration pneumonia on daily PPI, dyslipidemia, hypertension, venous stasis ulceration on doxycycline  who is seen for follow-up to discuss surveillance   1.  History of multiple adenomatous colon polyps --surveillance colonoscopy is due at this time after having 10 adenomatous polyps removed 1 year ago.  We discussed the risk, benefits and alternatives to this procedure and he is agreeable and wishes to proceed.  We will need to hold Eliquis 48 hours before procedure and we will reach out to Dr. Percival Spanish to ensure this is reasonable.  His BMI is over 50 at present which would preclude the procedure time being performed in the [facility].  Due to the pandemic we would not be able to do this surveillance procedure currently in the outpatient hospital setting due to COVID-19 procedure restrictions.  He feels that he will be able to lose weight, which he is actively trying to do.  If he is able to lose 10 to 15 pounds he will qualify for colonoscopy in the [facility].  I will schedule him a nurse visit in 4 to 6 weeks for a weight check and proceed with colonoscopy in the [facility] with BMI is under 50.0 at that time.  Will hold Eliquis 2 days prior to endoscopic procedures - will instruct when and how to resume after procedure. Benefits and risks of procedure explained including risks of bleeding, perforation, infection, missed lesions, reactions to medications and possible need for hospitalization and surgery for complications. Additional rare but real risk of stroke or other vascular clotting events off Eliquis also explained and  need to seek urgent help if any signs of these problems occur. Will communicate by phone or EMR with patient's  prescribing provider to confirm that holding Eliquis is reasonable in this case.   30 minutes total spent today including patient facing time, coordination of care, reviewing medical history/procedures/pertinent radiology studies, and documentation of the encounter.

## 2019-01-30 NOTE — Telephone Encounter (Signed)
[facility] Pre-operative Risk Assessment     Request for surgical clearance:     Endoscopy Procedure  What type of surgery is being performed?     Colonoscopy  When is this surgery scheduled?     03/04/2019  What type of clearance is required ?   Pharmacy  Are there any medications that need to be held prior to surgery and how long? Eliquis x 2 days  Practice name and name of physician performing surgery?      [facility]  What is your office phone and fax number?      Phone- [phone]  [phone]  Anesthesia type (None, local, MAC, general) ?       MAC

## 2019-01-30 NOTE — Patient Instructions (Addendum)
If you are age 64 or older, your body mass index should be between 23-30. Your Body mass index is 50.88 kg/m. If this is out of the aforementioned range listed, please consider follow up with your Primary Care Provider.  If you are age 31 or younger, your body mass index should be between 19-25. Your Body mass index is 50.88 kg/m. If this is out of the aformentioned range listed, please consider follow up with your Primary Care Provider.   You have a nurse visit on 02/25/2019 at 8:45am to recheck your weight and give instructions for colonoscopy.  We have held 03/04/2019 at 9:30am for colonoscopy.

## 2019-01-30 NOTE — Telephone Encounter (Signed)
Patient will be notified that he can hold Eliquis 2 days prior to colonoscopy scheduled for 03/04/19 when he comes for his weight check on 02/25/2019.

## 2019-01-30 NOTE — Telephone Encounter (Signed)
Patient with diagnosis of afib on Eliquis for anticoagulation.    Procedure: Colonoscopy Date of procedure: 03/04/2019  CHADS2-VASc score of  3 (CHF, HTN,  DM2)  CrCl 111 ml/min  Per office protocol, patient can hold Eliquis for 2 days prior to procedure.

## 2019-02-01 ENCOUNTER — Telehealth: Payer: Self-pay | Admitting: *Deleted

## 2019-02-01 LAB — WOUND CULTURE
MICRO NUMBER:: 10053097
SPECIMEN QUALITY:: ADEQUATE

## 2019-02-01 LAB — HOUSE ACCOUNT TRACKING

## 2019-02-01 NOTE — Telephone Encounter (Signed)
I informed pt of Dr. Leigh Aurora review of results and orders. Pt states he has an appt Monday.

## 2019-02-01 NOTE — Telephone Encounter (Signed)
-----   Message from Trula Slade, DPM sent at 01/31/2019  7:22 AM EST ----- WBC normal but inflammatory markers are elevated, consistent with infection. Continue antibiotics and I want to repeat x-ray next visit and possible MRI.

## 2019-02-04 ENCOUNTER — Ambulatory Visit (INDEPENDENT_AMBULATORY_CARE_PROVIDER_SITE_OTHER): Payer: BC Managed Care – PPO

## 2019-02-04 ENCOUNTER — Encounter: Payer: Self-pay | Admitting: Podiatry

## 2019-02-04 ENCOUNTER — Ambulatory Visit: Payer: BC Managed Care – PPO | Admitting: Podiatry

## 2019-02-04 ENCOUNTER — Other Ambulatory Visit: Payer: Self-pay

## 2019-02-04 DIAGNOSIS — L02612 Cutaneous abscess of left foot: Secondary | ICD-10-CM | POA: Diagnosis not present

## 2019-02-04 DIAGNOSIS — L97529 Non-pressure chronic ulcer of other part of left foot with unspecified severity: Secondary | ICD-10-CM

## 2019-02-04 DIAGNOSIS — M86172 Other acute osteomyelitis, left ankle and foot: Secondary | ICD-10-CM | POA: Diagnosis not present

## 2019-02-04 DIAGNOSIS — L03032 Cellulitis of left toe: Secondary | ICD-10-CM | POA: Diagnosis not present

## 2019-02-04 MED ORDER — CIPROFLOXACIN HCL 500 MG PO TABS: 500.0000 mg | ORAL_TABLET | Freq: Two times a day (BID) | ORAL | 0 refills | Status: DC

## 2019-02-05 NOTE — Progress Notes (Signed)
Subjective: 64 year old male presents the office today for follow-up evaluation of a wound of his left foot.  He states he is on the doxycycline and doing well and he thinks the wounds are looking better.  He has not seen any significant drainage and he thinks there is not been any increase in swelling or redness to his feet.  He has no pain.  He is been given Santyl on the wound on the second toe as well as Silvadene on the hallux wound.  He is still in the offloading shoe.  Unfortunately he was unable to tolerate the Darco wedge with a cam boot.  Denies any fevers, chills, nausea, vomiting.  Denies any calf pain, chest pain, shortness of breath.   Objective: AAO x3, NAD DP/PT pulses palpable bilaterally, CRT less than 3 seconds Ulceration continues on the plantar medial aspect of the left hallux as well as the dorsal aspect left second toe on the hallux.  The wound on the plantar hallux is more fibrotic and there is 1 central area of fibrotic tissue with there is no probing to bone today.  Fibrotic tissue present on the dorsal second toe wound.  There is no probing to bone, undermining or tunneling to the second toe.  Minimal chronic edema but there is no increase in cellulitis and there is no warmth of the toe or foot.  There is no drainage or pus.  There is no fluctuation or crepitation.  No significant malodor.  No pain with calf compression, swelling, warmth, erythema  Assessment: 64 year old male left chronic ulcerations  Plan: -All treatment options discussed with the patient including all alternatives, risks, complications.  -Repeat x-rays obtained reviewed.  Digital deformity is present.  There is no significant evidence of acute osteomyelitis identified today.  No evidence of acute fracture.  There is no soft tissue emphysema. -I debrided the wounds to utilize #312 with scalpel to any complications.  A long discussion today regarding treatment options both conservative as well as surgical.   He has had these wounds for quite some time.  We have tried numerous offloading devices without any significant improvement.  I do believe that the hallux wound particularly is due to decreased range of motion of the IPJ as well as the MPJ.  There is also digital form of the second toe.  Discussed surgical intervention to help avoid pressure.  Unfortunately also discussed amputation of the toes at this time.  He thinks that as long as the toes continue to look better he wants to hold off on this.  We will continue doxycycline add ciprofloxacin given wound cultures.  Continue offloading.  Return in about 2 weeks (around 02/18/2019).  To come in next week but unfortunate I am out of the office and he does not want to see anyone else.  Trula Slade DPM

## 2019-02-18 ENCOUNTER — Encounter: Payer: Self-pay | Admitting: Podiatry

## 2019-02-18 ENCOUNTER — Ambulatory Visit: Payer: BC Managed Care – PPO | Admitting: Podiatry

## 2019-02-18 ENCOUNTER — Other Ambulatory Visit: Payer: Self-pay

## 2019-02-18 DIAGNOSIS — M205X9 Other deformities of toe(s) (acquired), unspecified foot: Secondary | ICD-10-CM | POA: Diagnosis not present

## 2019-02-18 DIAGNOSIS — L97529 Non-pressure chronic ulcer of other part of left foot with unspecified severity: Secondary | ICD-10-CM | POA: Diagnosis not present

## 2019-02-18 DIAGNOSIS — M2042 Other hammer toe(s) (acquired), left foot: Secondary | ICD-10-CM

## 2019-02-18 MED ORDER — SANTYL 250 UNIT/GM EX OINT: 1.0000 "application " | TOPICAL_OINTMENT | Freq: Every day | CUTANEOUS | 5 refills | Status: DC

## 2019-02-20 NOTE — Progress Notes (Signed)
Subjective: 64 year old male presents the office today for follow-up evaluation of a wound of his left foot.  He does have the wound is doing better.  He has not had much drainage.  He has a small blood spot on the bandage but otherwise no pus.  Is been using the Silvadene on the hallux as well as Santyl on the second toe.  Overall he feels the wound has been getting better since we first started however continues.  He still wearing the offloading shoe.  Denies any fevers, chills, nausea, vomiting.  Denies any calf pain, chest pain, shortness of breath.  Objective: AAO x3, NAD DP/PT pulses palpable bilaterally, CRT less than 3 seconds Ulceration continues on the plantar medial aspect of the left hallux as well as the dorsal aspect left second toe.  On the dorsal aspect of the second toe is fibrotic tissue and upon debridement there is granulation tissue without any probing, undermining or tunneling.  No edema, erythema.  To the hallux there is a granular wound with a central area of fibrotic tissue.  Hyperkeratotic periwound.  Upon debridement today there is no ongoing probing to bone, undermining or tunneling.  No fluctuation crepitation.  Mild chronic edema but no significant erythema or warmth. Hallus limitus is present and there is decreased ROM of the hallux IPJ. Hammertoe of the 2nd digit.  No pain with calf compression, swelling, warmth, erythema      Assessment: 64 year old male left chronic ulcerations  Plan: -All treatment options discussed with the patient including all alternatives, risks, complications.  -Today we long discussion regarding treatment options both conservative as well as surgical.  He understands that he is at high risk of amputation.  However the wound is not significantly changed.  He has had previous arterial studies which have been normal.  I do think a lot of the issue is because of pressure in the location.  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  After discussion regards to the surgery he wants to hold off on this.  Discussed grafting of the wounds.  We will work insurance to try to get this approved. -Today I sharply debrided the wound utilizing the 312 with scalpel down to healthy, bleeding tissue.  I wanted to use Santyl on both wounds to help repair the wound bed.  This was refilled today. -I want a recheck blood work next week once he finishes antibiotics prior to any kind of graft. -Monitor for any clinical signs or symptoms of infection and directed to call the office immediately should any occur or go to the ER.  Return in about 2 weeks (around 03/04/2019).  [name] DPM

## 2019-03-04 ENCOUNTER — Other Ambulatory Visit: Payer: Self-pay

## 2019-03-04 ENCOUNTER — Ambulatory Visit: Payer: BC Managed Care – PPO | Admitting: Podiatry

## 2019-03-04 ENCOUNTER — Encounter: Payer: Self-pay | Admitting: Podiatry

## 2019-03-04 VITALS — Temp 97.1°F

## 2019-03-04 DIAGNOSIS — L97529 Non-pressure chronic ulcer of other part of left foot with unspecified severity: Secondary | ICD-10-CM

## 2019-03-04 DIAGNOSIS — M205X9 Other deformities of toe(s) (acquired), unspecified foot: Secondary | ICD-10-CM

## 2019-03-04 DIAGNOSIS — M2042 Other hammer toe(s) (acquired), left foot: Secondary | ICD-10-CM

## 2019-03-11 ENCOUNTER — Other Ambulatory Visit: Payer: Self-pay | Admitting: Podiatry

## 2019-03-11 DIAGNOSIS — L97529 Non-pressure chronic ulcer of other part of left foot with unspecified severity: Secondary | ICD-10-CM | POA: Diagnosis not present

## 2019-03-12 LAB — CBC WITH DIFFERENTIAL/PLATELET
Basophils Absolute: 0.1 10*3/uL (ref 0.0–0.2)
Basos: 1 %
EOS (ABSOLUTE): 0.2 10*3/uL (ref 0.0–0.4)
Eos: 3 %
Hematocrit: 41.1 % (ref 37.5–51.0)
Hemoglobin: 13.7 g/dL (ref 13.0–17.7)
Immature Grans (Abs): 0 10*3/uL (ref 0.0–0.1)
Immature Granulocytes: 0 %
Lymphocytes Absolute: 1.5 10*3/uL (ref 0.7–3.1)
Lymphs: 17 %
MCH: 29.7 pg (ref 26.6–33.0)
MCHC: 33.3 g/dL (ref 31.5–35.7)
MCV: 89 fL (ref 79–97)
Monocytes Absolute: 0.7 10*3/uL (ref 0.1–0.9)
Monocytes: 9 %
Neutrophils Absolute: 6.1 10*3/uL (ref 1.4–7.0)
Neutrophils: 70 %
Platelets: 190 10*3/uL (ref 150–450)
RBC: 4.61 x10E6/uL (ref 4.14–5.80)
RDW: 14.9 % (ref 11.6–15.4)
WBC: 8.7 10*3/uL (ref 3.4–10.8)

## 2019-03-12 LAB — C-REACTIVE PROTEIN: CRP: 32 mg/L — ABNORMAL HIGH (ref 0–10)

## 2019-03-12 LAB — SEDIMENTATION RATE: Sed Rate: 24 mm/hr (ref 0–30)

## 2019-03-12 NOTE — Progress Notes (Signed)
Subjective: 64 year old male presents the office today for follow-up evaluation of a wound of his left foot.  He has been using Santyl on the wounds daily he thinks the wounds overall are looking better.  He finished the course of antibiotics he forgot to get the blood work done.  He denies any drainage or pus and denies any swelling or redness to his foot and denies any red streaks.  He is been wearing the offloading shoe.  Denies any systemic concerns including fevers, chills, nausea, vomiting.  No calf pain, chest pain, shortness of breath.  Objective: AAO x3, NAD DP/PT pulses palpable bilaterally, CRT less than 3 seconds Ulceration continues on the plantar medial aspect of the left hallux as well as the dorsal aspect left second toe.  Overall the wounds are more granular today.  Small meta fibrotic tissue on the plantar hallux wound.  Overall appears that the same in diameter but mildly filled in more.  There is no probing to bone, exposed tendon.  There is no fluctuation or crepitation.  There is no drainage or pus.  No cellulitis is identified. Hallus limitus is present and there is decreased ROM of the hallux IPJ. Hammertoe of the 2nd digit.  No pain with calf compression, swelling, warmth, erythem   Assessment: 64 year old male left chronic ulcerations  Plan: -All treatment options discussed with the patient including all alternatives, risks, complications.  -Sharply debrided both the wounds to utilize #312 with scalpel that any complications none healthy, viable tissue.  1 continue Santyl dressing changes for now.  Overall the wounds are same in diameter we had another discussion today regarding treatment options.  He was told of any surgical intervention.  I tried to work on getting the graft however due to insurance not been successful so far.  Consider taken to surgery for wound debridement, graft application.  However I would like to get blood work prior to any surgical or grafts  application. -Monitor for any clinical signs or symptoms of infection and directed to call the office immediately should any occur or go to the ER.  Return in about 2 weeks (around 03/18/2019).   Trula Slade DPM

## 2019-03-18 ENCOUNTER — Encounter: Payer: Self-pay | Admitting: Podiatry

## 2019-03-18 ENCOUNTER — Ambulatory Visit: Payer: BC Managed Care – PPO | Admitting: Podiatry

## 2019-03-18 ENCOUNTER — Other Ambulatory Visit: Payer: Self-pay

## 2019-03-18 VITALS — Temp 96.0°F | Resp 16

## 2019-03-18 DIAGNOSIS — M205X9 Other deformities of toe(s) (acquired), unspecified foot: Secondary | ICD-10-CM

## 2019-03-18 DIAGNOSIS — L97529 Non-pressure chronic ulcer of other part of left foot with unspecified severity: Secondary | ICD-10-CM | POA: Diagnosis not present

## 2019-03-18 DIAGNOSIS — M2042 Other hammer toe(s) (acquired), left foot: Secondary | ICD-10-CM

## 2019-03-19 NOTE — Progress Notes (Signed)
Subjective: 64 year old male presents the office today for follow-up evaluation of a wound of his left foot.  He is still using Santryl to the wounds daily.  He does state that he feels the wounds are getting better.  He Nuys any significant drainage or pus.  No acute swelling or redness.  He has no other concerns today. Denies any systemic concerns including fevers, chills, nausea, vomiting.  No calf pain, chest pain, shortness of breath.  Objective: AAO x3, NAD DP/PT pulses palpable bilaterally, CRT less than 3 seconds Ulceration continues on the plantar medial aspect of the left hallux as well as the dorsal aspect left second toe.  Overall the wound diameters on the hallux are about the same at 2 x 1 cm hallux more superficial and granular today.  Also the distal aspect it is starting to close and is more narrow.  On the dorsal aspect second toe's fibrotic wound measures 0.5 x 0.5 cm.  There is no probing to bone to either wound and there is no undermining or tunneling.  Digital contractures are present. Hallus limitus is present and there is decreased ROM of the hallux IPJ. Hammertoe of the 2nd digit.  No pain with calf compression, swelling, warmth, erythema       Assessment: 64 year old male left chronic ulcerations  Plan: -All treatment options discussed with the patient including all alternatives, risks, complications.  -Sharply debrided both the wounds to utilize #312 with scalpel that any complications none healthy, nonviable tissue.  We will continue Santyl dressings daily.  Continue offloading.  Again was on a pending surgical intervention. -I reviewed blood work with him.  Although CRP is elevated his sed rate is down to normal and his white blood cell count is normal.  We will continue to monitor.  Return in about 3 weeks (around 04/08/2019) for wound.  Trula Slade DPM

## 2019-04-01 ENCOUNTER — Telehealth: Payer: Self-pay | Admitting: Cardiology

## 2019-04-01 NOTE — Telephone Encounter (Signed)
Medication samples have been provided to the patient.  Drug name: Eliquis 5 mg  Qty: 28 tablets    LOT: [id number]  Exp.Date: 03/23  Samples left at front desk for patient pick-up. Patient notified.

## 2019-04-01 NOTE — Telephone Encounter (Signed)
Patient calling the office for samples of medication:   1.  What medication and dosage are you requesting samples for? ELIQUIS 5 MG TABS tablet  2.  Are you currently out of this medication? No  Patient was provided information for the [facility]. He will be starting an application asap.

## 2019-04-08 ENCOUNTER — Other Ambulatory Visit: Payer: Self-pay

## 2019-04-08 ENCOUNTER — Ambulatory Visit: Payer: BC Managed Care – PPO | Admitting: Podiatry

## 2019-04-08 DIAGNOSIS — L97529 Non-pressure chronic ulcer of other part of left foot with unspecified severity: Secondary | ICD-10-CM | POA: Diagnosis not present

## 2019-04-09 NOTE — Progress Notes (Signed)
Subjective: 64 year old male presents the office today for follow-up evaluation of a wound of his left foot.  He states he is doing much better.  Continue Santyl daily.  He has come back to regular shoe over the last couple of days because a surgical shoe did break.  He denies any increase in swelling or any redness.  Denies any pain.  No fevers, chills, nausea, vomiting.  No calf pain, chest pain, shortness of breath.  He is still using Santryl to the wounds daily.  He does state that he feels the wounds are getting better.  He Nuys any significant drainage or pus.  No acute swelling or redness.  He has no other concerns today. Denies any systemic concerns including fevers, chills, nausea, vomiting.  No calf pain, chest pain, shortness of breath.  Objective: AAO x3, NAD DP/PT pulses palpable bilaterally, CRT less than 3 seconds Ulceration continues on the plantar medial aspect of the left hallux as well as the dorsal aspect left second toe. Today however the wound are doing better. There is some fibrotic tissue along the dorsal PIPJ of the second toe but is much more minimal and upon debridement small superficial granular wound is present but almost healed.  On the plantar aspect of the hallux the wounds were the same in diameter 1.9 x 1 cm but is more superficial with a granular wound base.  Mild hyperkeratotic periwound.  There is no edema, erythema, drainage or pus or any signs of infection. Hallus limitus is present and there is decreased ROM of the hallux IPJ. Hammertoe of the 2nd digit.  No pain with calf compression, swelling, warmth, erythema           Assessment: 64 year old male left chronic ulcerations  Plan: -All treatment options discussed with the patient including all alternatives, risks, complications.  -Sharply debrided both the wounds to utilize #312 with scalpel that any complications down to healthy, viable tissue.  We will continue Santyl dressings daily.  Continue  offloading.  There was actually good progress renal hold off any surgical intervention. -Monitor for any clinical signs or symptoms of infection and directed to call the office immediately should any occur or go to the ER.  Return in about 3 weeks (around 04/29/2019) for wound check left .  Trula Slade DPM

## 2019-04-10 DIAGNOSIS — T50Z95A Adverse effect of other vaccines and biological substances, initial encounter: Secondary | ICD-10-CM | POA: Insufficient documentation

## 2019-04-10 DIAGNOSIS — R5383 Other fatigue: Secondary | ICD-10-CM | POA: Diagnosis not present

## 2019-04-11 DIAGNOSIS — L97529 Non-pressure chronic ulcer of other part of left foot with unspecified severity: Secondary | ICD-10-CM | POA: Insufficient documentation

## 2019-04-11 DIAGNOSIS — I10 Essential (primary) hypertension: Secondary | ICD-10-CM | POA: Diagnosis not present

## 2019-04-11 DIAGNOSIS — M79676 Pain in unspecified toe(s): Secondary | ICD-10-CM | POA: Diagnosis not present

## 2019-04-11 DIAGNOSIS — M79609 Pain in unspecified limb: Secondary | ICD-10-CM | POA: Insufficient documentation

## 2019-04-11 DIAGNOSIS — M109 Gout, unspecified: Secondary | ICD-10-CM | POA: Diagnosis not present

## 2019-04-23 ENCOUNTER — Telehealth: Payer: Self-pay | Admitting: *Deleted

## 2019-04-23 NOTE — Telephone Encounter (Signed)
Called and left a message for the patient to call me back, I was calling to see how the left big toe was doing and to call the [place] office at [phone]. [name]

## 2019-04-29 ENCOUNTER — Ambulatory Visit: Payer: BC Managed Care – PPO | Admitting: Podiatry

## 2019-04-29 ENCOUNTER — Encounter: Payer: Self-pay | Admitting: Podiatry

## 2019-04-29 ENCOUNTER — Ambulatory Visit (INDEPENDENT_AMBULATORY_CARE_PROVIDER_SITE_OTHER): Payer: BC Managed Care – PPO

## 2019-04-29 ENCOUNTER — Other Ambulatory Visit: Payer: Self-pay

## 2019-04-29 VITALS — Temp 97.2°F | Resp 14

## 2019-04-29 DIAGNOSIS — M778 Other enthesopathies, not elsewhere classified: Secondary | ICD-10-CM

## 2019-04-29 DIAGNOSIS — M779 Enthesopathy, unspecified: Secondary | ICD-10-CM

## 2019-04-29 DIAGNOSIS — R609 Edema, unspecified: Secondary | ICD-10-CM | POA: Diagnosis not present

## 2019-04-29 DIAGNOSIS — L97529 Non-pressure chronic ulcer of other part of left foot with unspecified severity: Secondary | ICD-10-CM | POA: Diagnosis not present

## 2019-04-29 DIAGNOSIS — M86172 Other acute osteomyelitis, left ankle and foot: Secondary | ICD-10-CM

## 2019-04-29 MED ORDER — TRAMADOL HCL 50 MG PO TABS: 50.0000 mg | ORAL_TABLET | Freq: Three times a day (TID) | ORAL | 0 refills | Status: AC | PRN

## 2019-04-29 MED ORDER — SULFAMETHOXAZOLE-TRIMETHOPRIM 800-160 MG PO TABS: 1.0000 | ORAL_TABLET | Freq: Two times a day (BID) | ORAL | 0 refills | Status: DC

## 2019-04-29 MED ORDER — CIPROFLOXACIN HCL 500 MG PO TABS: 500.0000 mg | ORAL_TABLET | Freq: Two times a day (BID) | ORAL | 0 refills | Status: DC

## 2019-04-30 ENCOUNTER — Encounter: Payer: Self-pay | Admitting: Podiatry

## 2019-04-30 ENCOUNTER — Telehealth: Payer: Self-pay

## 2019-04-30 ENCOUNTER — Other Ambulatory Visit: Payer: Self-pay | Admitting: Podiatry

## 2019-04-30 ENCOUNTER — Telehealth: Payer: Self-pay | Admitting: *Deleted

## 2019-04-30 DIAGNOSIS — M86129 Other acute osteomyelitis, unspecified humerus: Secondary | ICD-10-CM | POA: Diagnosis not present

## 2019-04-30 NOTE — Telephone Encounter (Signed)
DOS 05/08/2019  AMPUTATION LEFT BIG TOE - [id number]  [employer] EFFECTIVE DATE - 01/11/2019    In-Network   Max Per Benefit Period Year-to-Date Remaining     CoInsurance 40%      Deductible $4000.00 $3905.49     Out-Of-Pocket $8550.00 [id number]  Copay Not Applicable Coinsurance [id number]  per  Service Year Authorization Required No

## 2019-04-30 NOTE — Telephone Encounter (Signed)
Called and left a message for the patient stating that Dr Jacqualyn Posey wanted to add another blood test (urice acid) and all the blood work will be done at [facility]

## 2019-04-30 NOTE — Telephone Encounter (Signed)
Pt states he is returning call to Dr. Leigh Aurora nurse.

## 2019-04-30 NOTE — Progress Notes (Signed)
Subjective: 64 year old male presents the office today for follow-up evaluation of a wound of his left foot.  Since I last saw him he is followed up with his primary care physician.  He had increased pain and swelling or redness to the left foot.  He was treated for presumed of gout with colchicine, Medrol Dosepak he was also given doxycycline.  I did receive a letter from his primary care physician stating there was concern for infection.  We try to call him follow-up once I receive the letter however he did not return her call.  He presents today for pain and swelling to the left foot.  He denies any recent injury.  He states on Saturday he did stand for some time but is not sure of an injury.  His continue with daily dressing changes.  He has not seen any pus coming from the area he denies any fevers, chills, nausea, vomiting.  Denies any calf pain, chest pain, shortness of breath.  Objective: AAO x3, NAD DP/PT pulses palpable bilaterally, CRT less than 3 seconds Ulceration continues on the plantar aspect of the right hallux and superficial wound on the dorsal second toe.  There is increased edema to the foot particularly on the long the hallux today.  Not able to identify any purulence there is no area of fluctuation.  There is tenderness palpation of dorsal aspect of foot as well as the ankle.  There is no area of pinpoint tenderness.  There is no other open lesions identified at this time on the left foot. No pain with calf compression, swelling, warmth, erythema  Arterial studies 10/02/2018 Summary:  Right: Resting right ankle-brachial index is within normal range. No  evidence of significant right lower extremity arterial disease. The right  toe-brachial index is abnormal.   Left: Resting left ankle-brachial index is within normal range. No  evidence of significant left lower extremity arterial disease. The left  toe-brachial index is normal.    Assessment: 64 year old male left chronic  ulcerations with worsening infection, osteomyelitis  Plan: -All treatment options discussed with the patient including all alternatives, risks, complications.  -X-rays obtained and reviewed.  There is subluxation of the hallux IPJ with changes along the IPJ consistent with osteomyelitis.  There is no soft tissue emphysema. -I had a long discussion with him today in regards to options.  I have recommended hallux imitation at this time.  I would like to do this this week however he cannot do it due to several engagements.  I discussed with him risks of waiting.  After discussion of his options he agrees to the hallux amputation.  I will try to get this done early next week.  In the meantime we will get cardiology clearance to stop blood thinners.  Will need a H&P form done by his primary care physician as this will be done at the hospital, likely [place] blood work including CBC, CMP, ESR, CRP. -Prescribed Bactrim and Cipro. -Continue offloading encouraged elevation.  If there is ANY worsening he needs to go directly to the emergency department as he will need IV antibiotics.  Will plan for toe amputation left side.  We discussed alternatives, risks, complications.  No promises or guarantees were given effective the procedure.  We discussed risks of surgery including, but not limited to spread of infection, further amputation, delayed or nonhealing, transfer lesions.  He understands that he is at risk for further amputation.  Consent form was signed.  We will plan on  doing this at [place] D[facility]

## 2019-04-30 NOTE — Addendum Note (Signed)
Addended by: Cranford Mon R on: 04/30/2019 08:16 AM   Modules accepted: Orders

## 2019-04-30 NOTE — Patient Instructions (Addendum)
DUE TO COVID-19 ONLY ONE VISITOR IS ALLOWED TO COME WITH YOU AND STAY IN THE WAITING ROOM ONLY DURING PRE OP AND PROCEDURE DAY OF SURGERY. THE 1 VISITOR MAY VISIT WITH YOU AFTER SURGERY IN YOUR PRIVATE ROOM DURING VISITING HOURS ONLY!  YOU NEED TO HAVE A COVID 19 TEST ON_4/23______ @_8 :55______, THIS TEST MUST BE DONE BEFORE SURGERY, COME  [address], [place] , [address].  ([place]) ONCE YOUR COVID TEST IS COMPLETED, PLEASE BEGIN THE QUARANTINE INSTRUCTIONS AS OUTLINED IN YOUR HANDOUT.                [name]    Your procedure is scheduled on: 05/08/19   Report to [facility] Main  Entrance   Report to admitting at  11:30 AM     Call this number if you have problems the morning of surgery [phone]    Remember: Do not eat food or drink liquids :After Midnight.   BRUSH YOUR TEETH MORNING OF SURGERY AND RINSE YOUR MOUTH OUT, NO CHEWING GUM CANDY OR MINTS.     Take these medicines the morning of surgery with A SIP OF WATER: Zoloft, Coreg, Allopurinol, Omeprazole, use your inhalers and bring them with you to the hospital  Bring your mask and tubing to the hospital with you   You may not have any metal on your body including             piercings  Do not wear jewelry, , lotions, powders or  deodorant                      Men may shave face and neck.   Do not bring valuables to the hospital. [place].  Contacts, dentures or bridgework may not be worn into surgery.       Patients discharged the day of surgery will not be allowed to drive home  . IF YOU ARE HAVING SURGERY AND GOING HOME THE SAME DAY, YOU MUST HAVE AN ADULT TO DRIVE YOU HOME AND BE WITH YOU FOR 24 HOURS   YOU MAY GO HOME BY TAXI OR UBER OR ORTHERWISE, BUT AN ADULT MUST ACCOMPANY YOU HOME AND STAY WITH YOU FOR 24 HOURS.  Name and phone number of your driver:  Special Instructions: N/A              Please read over the  following fact sheets you were given: _____________________________________________________________________             [name] - Preparing for Surgery Before surgery, you can play an important role.   Because skin is not sterile, your skin needs to be as free of germs as possible.   You can reduce the number of germs on your skin by washing with CHG (chlorahexidine gluconate) soap before surgery.   CHG is an antiseptic cleaner which kills germs and bonds with the skin to continue killing germs even after washing. Please DO NOT use if you have an allergy to CHG or antibacterial soaps.   If your skin becomes reddened/irritated stop using the CHG and inform your nurse when you arrive at Short Stay.   You may shave your face/neck.  Please follow these instructions carefully:  1.  Shower with CHG Soap the night before surgery and the  morning of Surgery.  2.  If you  choose to wash your hair, wash your hair first as usual with your  normal  shampoo.  3.  After you shampoo, rinse your hair and body thoroughly to remove the  shampoo.                                        4.  Use CHG as you would any other liquid soap.  You can apply chg directly  to the skin and wash                       Gently with a scrungie or clean washcloth.  5.  Apply the CHG Soap to your body ONLY FROM THE NECK DOWN.   Do not use on face/ open                           Wound or open sores. Avoid contact with eyes, ears mouth and genitals (private parts).                       Wash face,  Genitals (private parts) with your normal soap.             6.  Wash thoroughly, paying special attention to the area where your surgery  will be performed.  7.  Thoroughly rinse your body with warm water from the neck down.  8.  DO NOT shower/wash with your normal soap after using and rinsing off  the CHG Soap.            9.  Pat yourself dry with a clean towel.            10.  Wear clean pajamas.            11.  Place clean  sheets on your bed the night of your first shower and do not  sleep with pets. Day of Surgery : Do not apply any lotions/deodorants the morning of surgery.  Please wear clean clothes to the hospital/surgery center.  FAILURE TO FOLLOW THESE INSTRUCTIONS MAY RESULT IN THE CANCELLATION OF YOUR SURGERY PATIENT SIGNATURE_________________________________  NURSE SIGNATURE__________________________________  ________________________________________________________________________

## 2019-05-01 LAB — CBC WITH DIFFERENTIAL/PLATELET
Basophils Absolute: 0.1 10*3/uL (ref 0.0–0.2)
Basos: 1 %
EOS (ABSOLUTE): 0.1 10*3/uL (ref 0.0–0.4)
Eos: 2 %
Hematocrit: 40.8 % (ref 37.5–51.0)
Hemoglobin: 13 g/dL (ref 13.0–17.7)
Immature Grans (Abs): 0 10*3/uL (ref 0.0–0.1)
Immature Granulocytes: 0 %
Lymphocytes Absolute: 1 10*3/uL (ref 0.7–3.1)
Lymphs: 16 %
MCH: 28.8 pg (ref 26.6–33.0)
MCHC: 31.9 g/dL (ref 31.5–35.7)
MCV: 91 fL (ref 79–97)
Monocytes Absolute: 0.6 10*3/uL (ref 0.1–0.9)
Monocytes: 9 %
Neutrophils Absolute: 4.4 10*3/uL (ref 1.4–7.0)
Neutrophils: 72 %
Platelets: 187 10*3/uL (ref 150–450)
RBC: 4.51 x10E6/uL (ref 4.14–5.80)
RDW: 15.5 % — ABNORMAL HIGH (ref 11.6–15.4)
WBC: 6.1 10*3/uL (ref 3.4–10.8)

## 2019-05-01 LAB — COMPREHENSIVE METABOLIC PANEL
ALT: 16 IU/L (ref 0–44)
AST: 22 IU/L (ref 0–40)
Albumin/Globulin Ratio: 1.3 (ref 1.2–2.2)
Albumin: 3.7 g/dL — ABNORMAL LOW (ref 3.8–4.8)
Alkaline Phosphatase: 150 IU/L — ABNORMAL HIGH (ref 39–117)
BUN/Creatinine Ratio: 16 (ref 10–24)
BUN: 16 mg/dL (ref 8–27)
Bilirubin Total: 0.8 mg/dL (ref 0.0–1.2)
CO2: 22 mmol/L (ref 20–29)
Calcium: 8.8 mg/dL (ref 8.6–10.2)
Chloride: 101 mmol/L (ref 96–106)
Creatinine, Ser: 0.99 mg/dL (ref 0.76–1.27)
GFR calc Af Amer: 93 mL/min/{1.73_m2} (ref >59)
GFR calc non Af Amer: 81 mL/min/{1.73_m2} (ref >59)
Globulin, Total: 2.9 g/dL (ref 1.5–4.5)
Glucose: 128 mg/dL — ABNORMAL HIGH (ref 65–99)
Potassium: 5 mmol/L (ref 3.5–5.2)
Sodium: 138 mmol/L (ref 134–144)
Total Protein: 6.6 g/dL (ref 6.0–8.5)

## 2019-05-01 LAB — SEDIMENTATION RATE: Sed Rate: 35 mm/hr — ABNORMAL HIGH (ref 0–30)

## 2019-05-01 LAB — C-REACTIVE PROTEIN: CRP: 116 mg/L — ABNORMAL HIGH (ref 0–10)

## 2019-05-02 ENCOUNTER — Encounter (HOSPITAL_COMMUNITY): Payer: Self-pay

## 2019-05-02 ENCOUNTER — Telehealth: Payer: Self-pay

## 2019-05-02 ENCOUNTER — Encounter (HOSPITAL_COMMUNITY)
Admission: RE | Admit: 2019-05-02 | Discharge: 2019-05-02 | Disposition: A | Discharge status: Home / Self Care | Payer: BC Managed Care – PPO | Source: Ambulatory Visit | Attending: Podiatry | Admitting: Podiatry

## 2019-05-02 ENCOUNTER — Other Ambulatory Visit: Payer: Self-pay

## 2019-05-02 DIAGNOSIS — Z20822 Contact with and (suspected) exposure to covid-19: Secondary | ICD-10-CM | POA: Insufficient documentation

## 2019-05-02 DIAGNOSIS — Z01812 Encounter for preprocedural laboratory examination: Secondary | ICD-10-CM | POA: Diagnosis not present

## 2019-05-02 HISTORY — DX: Unspecified osteoarthritis, unspecified site: M19.90

## 2019-05-02 NOTE — Telephone Encounter (Signed)
Received medical clearance from patients Cardiologist. He recommended to stop Eliquis 2 days prior to surgery. I spoke to Taylor and informed him of this. He stated he understood.

## 2019-05-02 NOTE — Telephone Encounter (Signed)
   Primary Cardiologist: Minus Breeding, MD  Chart reviewed as part of pre-operative protocol coverage. Given past medical history and time since last visit, based on ACC/AHA guidelines, MURLE BAKOWSKI would be at acceptable risk for the planned procedure without further cardiovascular testing.   Pt takes Eliquis for afib with CHADS2VASc score of 3 (CHF, HTN, DM). Renal function is normal.  Ok to hold Eliquis for 2 days prior to procedure.  I will route this recommendation to the requesting party via Epic fax function and remove from pre-op pool.  Please call with questions.  Jossie Ng. Georgianne Gritz NP-C    05/02/2019, 1:42 PM [facility] [address] Office [phone] Fax [phone]

## 2019-05-02 NOTE — Telephone Encounter (Signed)
[name] is returning phone call.

## 2019-05-02 NOTE — Telephone Encounter (Signed)
   [facility] Pre-operative Risk Assessment    Request for surgical clearance:  1. What type of surgery is being performed? amputation toe MPJ joint  2. When is this surgery scheduled? TBD  3. What type of clearance is required (medical clearance vs. Pharmacy clearance to hold med vs. Both)? both  4. Are there any medications that need to be held prior to surgery and how long? Please review per request  5. Practice name and name of physician performing surgery? Annice Needy / [place] [facility]  6. What is your office phone number [phone]   7.   What is your office fax number [phone].  8.   Anesthesia type (None, local, MAC, general) ? general   Sherrie Mustache 05/02/2019, 10:19 AM  _________________________________________________________________   (provider comments below)  Dear Dr. Minus Breeding, Cc: Dr. Baltazar Apo,    Delray Alt, DOB 1955/10/09, is a mutual patient.  I am treating him for osteomyelitis of the left big toe.  He needs surgery which would be performed at an outpatient ambulatory surgical center.  He would receive general anesthetic.  The procedure will be amputation toe MPJ joint.  I would like written medical clearance.  Are there any contra-indications?  Does the patient need to stop taking any medications prior to his surgery?  If so, please give the instructions.  You can respond via Epic in-basket or fax to [phone].   Sincerely,    Celesta Gentile, DPM

## 2019-05-02 NOTE — Progress Notes (Signed)
PCP - Dr. Brigitte Pulse Cardiologist -Dr.  Percival Spanish  Chest x-ray - no EKG - 11/23/18 Stress Test - no ECHO - 12/10/18 Cardiac Cath - no  Sleep Study - yes CPAP - yes  Fasting Blood Sugar - NA. Pt denies having diabetes Checks Blood Sugar _____ times a day  Blood Thinner Instructions:Eliquis Aspirin Instructions:Pt will call MD Last Dose:4/24  Anesthesia review:   Patient denies shortness of breath, fever, cough and chest pain at PAT appointment yes  Patient verbalized understanding of instructions that were given to them at the PAT appointment. Patient was also instructed that they will need to review over the PAT instructions again at home before surgery. Yes  BMI 50

## 2019-05-02 NOTE — Telephone Encounter (Signed)
Pt takes Eliquis for afib with CHADS2VASc score of 3 (CHF, HTN, DM). Renal function is normal.  Ok to hold Eliquis for 2 days prior to procedure.

## 2019-05-03 ENCOUNTER — Other Ambulatory Visit (HOSPITAL_COMMUNITY)
Admission: RE | Admit: 2019-05-03 | Discharge: 2019-05-03 | Disposition: A | Discharge status: Home / Self Care | Payer: BC Managed Care – PPO | Source: Ambulatory Visit | Attending: Podiatry | Admitting: Podiatry

## 2019-05-03 ENCOUNTER — Encounter (HOSPITAL_COMMUNITY)
Admission: RE | Admit: 2019-05-03 | Discharge: 2019-05-03 | Disposition: A | Discharge status: Home / Self Care | Payer: BC Managed Care – PPO | Source: Ambulatory Visit | Attending: Podiatry | Admitting: Podiatry

## 2019-05-03 DIAGNOSIS — Z20822 Contact with and (suspected) exposure to covid-19: Secondary | ICD-10-CM | POA: Diagnosis not present

## 2019-05-03 DIAGNOSIS — Z01812 Encounter for preprocedural laboratory examination: Secondary | ICD-10-CM | POA: Diagnosis not present

## 2019-05-03 LAB — HEMOGLOBIN A1C
Hgb A1c MFr Bld: 6.3 % — ABNORMAL HIGH (ref 4.8–5.6)
Mean Plasma Glucose: 134.11 mg/dL

## 2019-05-03 LAB — SARS CORONAVIRUS 2 (TAT 6-24 HRS): SARS Coronavirus 2: NEGATIVE

## 2019-05-03 NOTE — Progress Notes (Signed)
I left a message with Shellie at [facility] on 4/23 at 16:15. We still need an H&P for the chat prior to day of surgery.  The clearance is on the chart.

## 2019-05-04 ENCOUNTER — Other Ambulatory Visit (HOSPITAL_COMMUNITY): Payer: BC Managed Care – PPO

## 2019-05-06 ENCOUNTER — Telehealth: Payer: Self-pay | Admitting: *Deleted

## 2019-05-06 DIAGNOSIS — M86172 Other acute osteomyelitis, left ankle and foot: Secondary | ICD-10-CM

## 2019-05-06 DIAGNOSIS — L97529 Non-pressure chronic ulcer of other part of left foot with unspecified severity: Secondary | ICD-10-CM

## 2019-05-06 DIAGNOSIS — R609 Edema, unspecified: Secondary | ICD-10-CM

## 2019-05-06 NOTE — Telephone Encounter (Signed)
-----   Message from Trula Slade, DPM sent at 05/01/2019  3:59 PM EDT ----- Val- please let him know that the WBC is normal. Sed rate mildly increased and CRP is very elevated, consistent with bone infection. Please make sure he is taking antibiotics and if there is any worsening signs of infection he needs to go to the ER.

## 2019-05-06 NOTE — Telephone Encounter (Signed)
I informed pt of Dr. Leigh Aurora review of results and he states understanding. Pt asked if I had received a letter from Dr. Brigitte Pulse for the surgery and I told him I had not and did not see a letter listed in his chart, but I would inform S. Durant - Surgery coordinator to investigate. Pt asked if he should get a wheelchair and I told him he may be able to navigate through his house better with a knee scooter. Required form, clinical and demographics faxed to [place] and emailed.

## 2019-05-06 NOTE — Progress Notes (Signed)
Anesthesia Chart Review   Case: [id number] Date/Time: 05/08/19 1315   Procedure: AMPUTATION LEFT GREAT  TOE (Left Toe)   Anesthesia type: General   Pre-op diagnosis: OSEOMYELITIS   Location: [facility] / [facility]   Surgeons: Trula Slade, DPM      DISCUSSION:63 y.o. former smoker (10 pack years, quit 01/10/09) with h/o HTN, DM II, Atrial Fibrillation (on Eliquis), CHF, osteomyelitis scheduled for above procedure 05/08/2019 with Dr. Celesta Gentile.   Per cardiology 05/02/2019, "Chart reviewed as part of pre-operative protocol coverage. Given past medical history and time since last visit, based on ACC/AHA guidelines, [name] would be at acceptable risk for the planned procedure without further cardiovascular testing.  Pt takes Eliquis for afib with CHADS2VASc score of 3 (CHF, HTN, DM). Renal function is normal. Ok to hold Eliquis for 2 days prior to procedure."  H&P requested from Dr. Leigh Aurora office.    Anticipate pt can proceed with planned procedure barring acute status change.   VS: BP (!) 135/94 (BP Location: Right Arm)   Pulse 75   Temp 37 C (Oral)   Resp 18   Ht 5\' 11"  (1.803 m)   Wt (!) 166.6 kg   SpO2 100%   BMI 51.24 kg/m   PROVIDERS: [name], MD is PCP   Minus Breeding, MD is Cardiologist  LABS: Labs reviewed: Acceptable for surgery. (all labs ordered are listed, but only abnormal results are displayed)  Labs Reviewed  HEMOGLOBIN A1C - Abnormal; Notable for the following components:      Result Value   Hgb A1c MFr Bld 6.3 (*)    All other components within normal limits     IMAGES:   EKG: 11/23/2018 Rate 94 bpm Atrial fibrillation  Left axis deviation  Right bundle branch block  Inferior infarct, age undetermined Anterolateral infarct, age undetermined   CV: Echo 12/10/2018 IMPRESSIONS    1. Technically difficult study. Left ventricular ejection fraction, by  visual estimation, is grossly 45 to 50%. There appears to be mildly   decreased global systolic function. Images are not sufficient to assess  for regional wall motion abnormalities.  There is mildly increased left ventricular hypertrophy.  2. Left ventricular diastolic parameters are indeterminate.  3. Global right ventricle has mildly reduced systolic function.The right  ventricular size is mildly enlarged.  4. Left atrial size was mildly dilated.  5. Right atrial size was normal.  6. The mitral valve is normal in structure. Trace mitral valve  regurgitation.  7. The tricuspid valve is not well visualized. Tricuspid valve  regurgitation is trivial.  8. The aortic valve is tricuspid. Aortic valve regurgitation is not  visualized.  9. The pulmonic valve was not well visualized. Pulmonic valve  regurgitation is not visualized.  10. There is dilatation of the aortic root measuring 40 mm.  11. The inferior vena cava is dilated in size with <50% respiratory  variability, suggesting right atrial pressure of 15 mmHg.  12. The tricuspid regurgitant velocity is 2.79 m/s, and with an assumed  right atrial pressure of 15 mmHg, the estimated right ventricular systolic  pressure is moderately elevated at 46.1 mmHg. Past Medical History:  Diagnosis Date  . Acute systolic HF (heart failure) ([place])   . Arthritis    lt foot, hips knees   . Atrial fibrillation ([place])   . CHF (congestive heart failure) ([place])   . Diabetes mellitus, new onset ([name])   . Diverticulosis   . Dyslipidemia   .  Dyspnea   . Dysrhythmia 2019   afib  . Gout attack 01/16/2012  . Hypertension   . Internal hemorrhoids   . Lymphoma ([place])   . NHL dx'd 01/2010  . Pneumonia   . Pneumonia due to COVID-19 virus 07/2018  . Pulmonary hypertension ([place])   . Sepsis ([place]) 02/2017   secondary to influenza; requiring trach  . Tubular adenoma of colon   . Venous insufficiency   . Venous stasis ulcers ([facility])     Past Surgical History:  Procedure Laterality Date  . BRONCHOSCOPY    .  ESOPHAGOGASTRODUODENOSCOPY ENDOSCOPY     Dr Jearld [place]  . INGUINAL HERNIA REPAIR Left    2001  . PORTA CATH INSERTION  2011  . TRACHEOSTOMY  03/2017   with decannulation    MEDICATIONS: . acetaminophen (TYLENOL) 325 MG tablet  . albuterol (PROVENTIL HFA;VENTOLIN HFA) 108 (90 Base) MCG/ACT inhaler  . allopurinol (ZYLOPRIM) 300 MG tablet  . atorvastatin (LIPITOR) 20 MG tablet  . becaplermin (REGRANEX) 0.01 % gel  . carvedilol (COREG) 12.5 MG tablet  . ciprofloxacin (CIPRO) 500 MG tablet  . collagenase (SANTYL) ointment  . [employer] DIGESTIVE PROBIOTIC 250 MG capsule  . ELIQUIS 5 MG TABS tablet  . furosemide (LASIX) 20 MG tablet  . KLOR-CON M20 20 MEQ tablet  . losartan (COZAAR) 25 MG tablet  . Melatonin 3 MG TABS  . Multiple Vitamin (MULTIVITAMIN WITH MINERALS) TABS tablet  . mupirocin ointment (BACTROBAN) 2 %  . olmesartan-hydrochlorothiazide (BENICAR HCT) 40-25 MG tablet  . omeprazole (PRILOSEC) 40 MG capsule  . sertraline (ZOLOFT) 50 MG tablet  . sodium chloride (OCEAN) 0.65 % SOLN nasal spray  . sulfamethoxazole-trimethoprim (BACTRIM DS) 800-160 MG tablet  . vitamin C (VITAMIN C) 500 MG tablet   No current facility-administered medications for this encounter.   . sodium chloride 0.9 % injection 10 mL    [name] Pre-Surgical Testing [phone] 05/06/19  12:02 PM

## 2019-05-07 MED ORDER — VANCOMYCIN HCL 1500 MG/300ML IV SOLN
1500.0000 mg | INTRAVENOUS | Status: AC
  Administered 2019-05-08: 1500 mg via INTRAVENOUS
  Filled 2019-05-07: qty 300

## 2019-05-08 ENCOUNTER — Ambulatory Visit (HOSPITAL_COMMUNITY): Payer: BC Managed Care – PPO | Admitting: Physician Assistant

## 2019-05-08 ENCOUNTER — Encounter (HOSPITAL_COMMUNITY): Payer: Self-pay | Admitting: Podiatry

## 2019-05-08 ENCOUNTER — Ambulatory Visit (HOSPITAL_COMMUNITY)
Admission: RE | Admit: 2019-05-08 | Discharge: 2019-05-08 | Disposition: A | Discharge status: Home / Self Care | Payer: BC Managed Care – PPO | Attending: Podiatry | Admitting: Podiatry

## 2019-05-08 ENCOUNTER — Ambulatory Visit (HOSPITAL_COMMUNITY): Payer: BC Managed Care – PPO

## 2019-05-08 ENCOUNTER — Encounter (HOSPITAL_COMMUNITY)
Admission: RE | Disposition: A | Discharge status: Home / Self Care | Payer: Self-pay | Source: Home / Self Care | Attending: Podiatry

## 2019-05-08 ENCOUNTER — Other Ambulatory Visit: Payer: Self-pay

## 2019-05-08 DIAGNOSIS — G4730 Sleep apnea, unspecified: Secondary | ICD-10-CM | POA: Insufficient documentation

## 2019-05-08 DIAGNOSIS — G4733 Obstructive sleep apnea (adult) (pediatric): Secondary | ICD-10-CM | POA: Diagnosis not present

## 2019-05-08 DIAGNOSIS — I4510 Unspecified right bundle-branch block: Secondary | ICD-10-CM | POA: Diagnosis not present

## 2019-05-08 DIAGNOSIS — M868X7 Other osteomyelitis, ankle and foot: Secondary | ICD-10-CM | POA: Diagnosis not present

## 2019-05-08 DIAGNOSIS — E1169 Type 2 diabetes mellitus with other specified complication: Secondary | ICD-10-CM | POA: Diagnosis not present

## 2019-05-08 DIAGNOSIS — M869 Osteomyelitis, unspecified: Secondary | ICD-10-CM | POA: Insufficient documentation

## 2019-05-08 DIAGNOSIS — Z87891 Personal history of nicotine dependence: Secondary | ICD-10-CM | POA: Diagnosis not present

## 2019-05-08 DIAGNOSIS — I10 Essential (primary) hypertension: Secondary | ICD-10-CM | POA: Insufficient documentation

## 2019-05-08 DIAGNOSIS — I2510 Atherosclerotic heart disease of native coronary artery without angina pectoris: Secondary | ICD-10-CM | POA: Insufficient documentation

## 2019-05-08 DIAGNOSIS — M109 Gout, unspecified: Secondary | ICD-10-CM | POA: Insufficient documentation

## 2019-05-08 DIAGNOSIS — Z6841 Body Mass Index (BMI) 40.0 and over, adult: Secondary | ICD-10-CM | POA: Insufficient documentation

## 2019-05-08 DIAGNOSIS — M86672 Other chronic osteomyelitis, left ankle and foot: Secondary | ICD-10-CM | POA: Diagnosis not present

## 2019-05-08 DIAGNOSIS — L97529 Non-pressure chronic ulcer of other part of left foot with unspecified severity: Secondary | ICD-10-CM | POA: Diagnosis not present

## 2019-05-08 DIAGNOSIS — S98912A Complete traumatic amputation of left foot, level unspecified, initial encounter: Secondary | ICD-10-CM | POA: Diagnosis not present

## 2019-05-08 DIAGNOSIS — I96 Gangrene, not elsewhere classified: Secondary | ICD-10-CM | POA: Diagnosis not present

## 2019-05-08 DIAGNOSIS — Z09 Encounter for follow-up examination after completed treatment for conditions other than malignant neoplasm: Secondary | ICD-10-CM

## 2019-05-08 HISTORY — PX: AMPUTATION TOE: SHX6595

## 2019-05-08 SURGERY — AMPUTATION, TOE
Anesthesia: Monitor Anesthesia Care | Site: Toe | Laterality: Left

## 2019-05-08 MED ORDER — MIDAZOLAM HCL 2 MG/2ML IJ SOLN
INTRAMUSCULAR | Status: DC | PRN
  Administered 2019-05-08 (×2): 1 mg via INTRAVENOUS

## 2019-05-08 MED ORDER — MIDAZOLAM HCL 2 MG/2ML IJ SOLN
INTRAMUSCULAR | Status: AC
  Administered 2019-05-08: 2 mg via INTRAVENOUS
  Filled 2019-05-08: qty 2

## 2019-05-08 MED ORDER — 0.9 % SODIUM CHLORIDE (POUR BTL) OPTIME
TOPICAL | Status: DC | PRN
  Administered 2019-05-08: 60 mL

## 2019-05-08 MED ORDER — ONDANSETRON HCL 4 MG/2ML IJ SOLN
INTRAMUSCULAR | Status: DC | PRN
  Administered 2019-05-08: 4 mg via INTRAVENOUS

## 2019-05-08 MED ORDER — BUPIVACAINE HCL (PF) 0.25 % IJ SOLN
INTRAMUSCULAR | Status: AC
  Filled 2019-05-08: qty 30

## 2019-05-08 MED ORDER — CHLORHEXIDINE GLUCONATE CLOTH 2 % EX PADS
6.0000 | MEDICATED_PAD | Freq: Once | CUTANEOUS | Status: AC
  Administered 2019-05-08: 6 via TOPICAL

## 2019-05-08 MED ORDER — OXYCODONE-ACETAMINOPHEN 5-325 MG PO TABS: 1.0000 | ORAL_TABLET | Freq: Four times a day (QID) | ORAL | 0 refills | Status: DC | PRN

## 2019-05-08 MED ORDER — SULFAMETHOXAZOLE-TRIMETHOPRIM 800-160 MG PO TABS
1.0000 | ORAL_TABLET | Freq: Two times a day (BID) | ORAL | 0 refills | Status: DC
Start: 2019-05-08 — End: 2019-07-22

## 2019-05-08 MED ORDER — KETAMINE HCL 10 MG/ML IJ SOLN
INTRAMUSCULAR | Status: DC | PRN
Start: 2019-05-08 — End: 2019-05-08
  Administered 2019-05-08 (×2): 5 mg via INTRAVENOUS

## 2019-05-08 MED ORDER — BUPIVACAINE HCL (PF) 0.25 % IJ SOLN
INTRAMUSCULAR | Status: DC | PRN
  Administered 2019-05-08: 5 mL

## 2019-05-08 MED ORDER — LIDOCAINE-EPINEPHRINE 2 %-1:100000 IJ SOLN
INTRAMUSCULAR | Status: DC | PRN
Start: 2019-05-08 — End: 2019-05-08
  Administered 2019-05-08: 20 mL via INTRADERMAL

## 2019-05-08 MED ORDER — PROPOFOL 500 MG/50ML IV EMUL
INTRAVENOUS | Status: DC | PRN
  Administered 2019-05-08: 50 ug/kg/min via INTRAVENOUS

## 2019-05-08 MED ORDER — MIDAZOLAM HCL 2 MG/2ML IJ SOLN: 1.0000 mg | INTRAMUSCULAR | Status: DC

## 2019-05-08 MED ORDER — LIDOCAINE HCL (PF) 1 % IJ SOLN
INTRAMUSCULAR | Status: AC
  Filled 2019-05-08: qty 30

## 2019-05-08 MED ORDER — FENTANYL CITRATE (PF) 100 MCG/2ML IJ SOLN: 25.0000 ug | INTRAMUSCULAR | Status: DC | PRN

## 2019-05-08 MED ORDER — KETAMINE HCL 10 MG/ML IJ SOLN
INTRAMUSCULAR | Status: AC
  Filled 2019-05-08: qty 1

## 2019-05-08 MED ORDER — MIDAZOLAM HCL 2 MG/2ML IJ SOLN
INTRAMUSCULAR | Status: AC
  Filled 2019-05-08: qty 2

## 2019-05-08 MED ORDER — PROPOFOL 1000 MG/100ML IV EMUL
INTRAVENOUS | Status: AC
  Filled 2019-05-08: qty 100

## 2019-05-08 MED ORDER — LIDOCAINE HCL 1 % IJ SOLN
INTRAMUSCULAR | Status: DC | PRN
  Administered 2019-05-08: 5 mL

## 2019-05-08 MED ORDER — PROMETHAZINE HCL 25 MG PO TABS
25.0000 mg | ORAL_TABLET | Freq: Three times a day (TID) | ORAL | 0 refills | Status: DC | PRN
Start: 2019-05-08 — End: 2019-07-22

## 2019-05-08 MED ORDER — LIDOCAINE 2% (20 MG/ML) 5 ML SYRINGE
INTRAMUSCULAR | Status: AC
  Filled 2019-05-08: qty 5

## 2019-05-08 MED ORDER — PROPOFOL 10 MG/ML IV BOLUS
INTRAVENOUS | Status: DC | PRN
  Administered 2019-05-08: 20 mg via INTRAVENOUS

## 2019-05-08 MED ORDER — DEXAMETHASONE SODIUM PHOSPHATE 10 MG/ML IJ SOLN
INTRAMUSCULAR | Status: AC
  Filled 2019-05-08: qty 1

## 2019-05-08 MED ORDER — FENTANYL CITRATE (PF) 100 MCG/2ML IJ SOLN: 50.0000 ug | INTRAMUSCULAR | Status: DC

## 2019-05-08 MED ORDER — ONDANSETRON HCL 4 MG/2ML IJ SOLN
INTRAMUSCULAR | Status: AC
  Filled 2019-05-08: qty 2

## 2019-05-08 MED ORDER — PROPOFOL 10 MG/ML IV BOLUS
INTRAVENOUS | Status: AC
  Filled 2019-05-08: qty 20

## 2019-05-08 MED ORDER — ROPIVACAINE HCL 5 MG/ML IJ SOLN
INTRAMUSCULAR | Status: DC | PRN
  Administered 2019-05-08: 10 mL via EPIDURAL

## 2019-05-08 MED ORDER — LIDOCAINE 2% (20 MG/ML) 5 ML SYRINGE
INTRAMUSCULAR | Status: DC | PRN
  Administered 2019-05-08: 40 mg via INTRAVENOUS

## 2019-05-08 MED ORDER — LACTATED RINGERS IV SOLN: INTRAVENOUS | Status: DC

## 2019-05-08 MED ORDER — FENTANYL CITRATE (PF) 100 MCG/2ML IJ SOLN
INTRAMUSCULAR | Status: AC
  Administered 2019-05-08: 100 ug via INTRAVENOUS
  Filled 2019-05-08: qty 2

## 2019-05-08 SURGICAL SUPPLY — 44 items
APL PRP STRL LF DISP 70% ISPRP (MISCELLANEOUS) ×1
BLADE HEX COATED 2.75 (ELECTRODE) ×2 IMPLANT
BLADE OSCILLATING/SAGITTAL (BLADE) ×2
BLADE SW THK.38XMED LNG THN (BLADE) ×1 IMPLANT
BNDG CMPR 9X4 STRL LF SNTH (GAUZE/BANDAGES/DRESSINGS) ×1
BNDG CONFORM 2 STRL LF (GAUZE/BANDAGES/DRESSINGS) ×2 IMPLANT
BNDG ELASTIC 3X5.8 VLCR STR LF (GAUZE/BANDAGES/DRESSINGS) ×2 IMPLANT
BNDG ELASTIC 6X5.8 VLCR STR LF (GAUZE/BANDAGES/DRESSINGS) ×1 IMPLANT
BNDG ESMARK 4X9 LF (GAUZE/BANDAGES/DRESSINGS) ×2 IMPLANT
BNDG GAUZE ELAST 4 BULKY (GAUZE/BANDAGES/DRESSINGS) ×2 IMPLANT
CHLORAPREP W/TINT 26 (MISCELLANEOUS) ×2 IMPLANT
COVER MAYO STAND STRL (DRAPES) ×1 IMPLANT
COVER WAND RF STERILE (DRAPES) IMPLANT
CUFF TOURN SGL QUICK 18X4 (TOURNIQUET CUFF) ×1 IMPLANT
DRSG EMULSION OIL 3X3 NADH (GAUZE/BANDAGES/DRESSINGS) ×2 IMPLANT
DRSG PAD ABDOMINAL 8X10 ST (GAUZE/BANDAGES/DRESSINGS) ×1 IMPLANT
ELECT REM PT RETURN 15FT ADLT (MISCELLANEOUS) ×2 IMPLANT
GAUZE SPONGE 4X4 12PLY STRL (GAUZE/BANDAGES/DRESSINGS) ×2 IMPLANT
GLOVE BIOGEL PI IND STRL 7.5 (GLOVE) ×1 IMPLANT
GLOVE BIOGEL PI INDICATOR 7.5 (GLOVE) ×1
GLOVE SURG SS PI 7.0 STRL IVOR (GLOVE) ×1 IMPLANT
GLOVE SURG SS PI 7.5 STRL IVOR (GLOVE) ×2 IMPLANT
GOWN STRL REUS W/ TWL LRG LVL3 (GOWN DISPOSABLE) ×1 IMPLANT
GOWN STRL REUS W/ TWL XL LVL3 (GOWN DISPOSABLE) ×1 IMPLANT
GOWN STRL REUS W/TWL LRG LVL3 (GOWN DISPOSABLE) ×2
GOWN STRL REUS W/TWL XL LVL3 (GOWN DISPOSABLE) ×2
HOVERMATT SINGLE USE (MISCELLANEOUS) ×1 IMPLANT
KIT BASIN (CUSTOM PROCEDURE TRAY) ×2 IMPLANT
KIT TURNOVER KIT A (KITS) IMPLANT
NDL HYPO 25X1 1.5 SAFETY (NEEDLE) ×1 IMPLANT
NDL SAFETY ECLIPSE 18X1.5 (NEEDLE) IMPLANT
NEEDLE HYPO 18GX1.5 SHARP (NEEDLE)
NEEDLE HYPO 25X1 1.5 SAFETY (NEEDLE) ×2 IMPLANT
PACK ORTHO EXTREMITY (CUSTOM PROCEDURE TRAY) IMPLANT
PENCIL SMOKE EVACUATOR (MISCELLANEOUS) IMPLANT
SUT ETHILON 3 0 PS 1 (SUTURE) IMPLANT
SUT MNCRL AB 3-0 PS2 18 (SUTURE) ×1 IMPLANT
SUT MON AB 5-0 PS2 18 (SUTURE) IMPLANT
SUT PROLENE 2 0 SH DA (SUTURE) ×2 IMPLANT
SUT PROLENE 3 0 SH 48 (SUTURE) ×1 IMPLANT
SYR 20ML LL LF (SYRINGE) ×2 IMPLANT
TOWEL OR 17X26 10 PK STRL BLUE (TOWEL DISPOSABLE) ×1 IMPLANT
UNDERPAD 30X36 HEAVY ABSORB (UNDERPADS AND DIAPERS) ×2 IMPLANT
YANKAUER SUCT BULB TIP 10FT TU (MISCELLANEOUS) ×1 IMPLANT

## 2019-05-08 NOTE — Anesthesia Postprocedure Evaluation (Signed)
Anesthesia Post Note  Patient: [name]  Procedure(s) Performed: AMPUTATION LEFT GREAT  TOE (Left Toe)     Patient location during evaluation: PACU Anesthesia Type: MAC Level of consciousness: awake and alert Pain management: pain level controlled Vital Signs Assessment: post-procedure vital signs reviewed and stable Respiratory status: spontaneous breathing, nonlabored ventilation, respiratory function stable and patient connected to nasal cannula oxygen Cardiovascular status: stable and blood pressure returned to baseline Postop Assessment: no apparent nausea or vomiting Anesthetic complications: no    Last Vitals:  Vitals:   05/08/19 1500 05/08/19 1513  BP: (!) 119/58 137/80  Pulse: 66 67  Resp: 17 16  Temp: (!) 36.3 C 36.4 C  SpO2: 96% 96%    Last Pain:  Vitals:   05/08/19 1513  TempSrc:   PainSc: 0-No pain                 Gannon Heinzman S

## 2019-05-08 NOTE — Brief Op Note (Signed)
05/08/2019  2:49 PM  PATIENT:  [name]  64 y.o. male  PRE-OPERATIVE DIAGNOSIS:  OSEOMYELITIS  POST-OPERATIVE DIAGNOSIS:  OSEOMYELITIS  PROCEDURE:  Procedure(s): AMPUTATION LEFT GREAT  TOE (Left)  SURGEON:  Surgeon(s) and Role:    * Trula Slade, DPM - Primary  PHYSICIAN ASSISTANT:   ASSISTANTS: none   ANESTHESIA:   MAC  EBL:  50 mL   BLOOD ADMINISTERED:none  DRAINS: none   LOCAL MEDICATIONS USED:  Lidocaine and marcaine plain  SPECIMEN:  Source of Specimen:  bone for pathology  DISPOSITION OF SPECIMEN:  PATHOLOGY  COUNTS:  YES  TOURNIQUET:   Total Tourniquet Time Documented: Calf (Left) - 27 minutes Total: Calf (Left) - 27 minutes   DICTATION: .Viviann Spare Dictation  PLAN OF CARE: Discharge to home after PACU  PATIENT DISPOSITION:  PACU - hemodynamically stable.   Delay start of Pharmacological VTE agent (>24hrs) due to surgical blood loss or risk of bleeding: no  Intraoperative findings: Remaining metatarsal appeared to be viable. No tracking noted. No abscess or signs of abscess.

## 2019-05-08 NOTE — Progress Notes (Signed)
Patient seen and evaluated in pre-op for left hallux amputation. He finished the course of antibiotics. The toe is still quite swollen. No pain. No fevers, chills, nausea, vomiting. No calf pain, chest pain, SOB. Again discussed the surgery and postop course. Discussed all alternatives risks complications. No further questions. NPO. Surgical consent signed.

## 2019-05-08 NOTE — Progress Notes (Signed)
Anesthesia H&P Update: History and Physical Exam reviewed; patient is OK for planned anesthetic and procedure. ? ?

## 2019-05-08 NOTE — Transfer of Care (Signed)
Immediate Anesthesia Transfer of Care Note  Patient: OBI SCRIMA  Procedure(s) Performed: AMPUTATION LEFT GREAT  TOE (Left Toe)  Patient Location: PACU  Anesthesia Type:MAC  Level of Consciousness: awake, alert  and oriented  Airway & Oxygen Therapy: Patient Spontanous Breathing and Patient connected to face mask oxygen  Post-op Assessment: Report given to RN and Post -op Vital signs reviewed and stable  Post vital signs: Reviewed and stable  Last Vitals:  Vitals Value Taken Time  BP 143/88 05/08/19 1448  Temp    Pulse 70 05/08/19 1450  Resp 15 05/08/19 1450  SpO2 100 % 05/08/19 1450  Vitals shown include unvalidated device data.  Last Pain:  Vitals:   05/08/19 1304  TempSrc:   PainSc: 0-No pain         Complications: No apparent anesthesia complications

## 2019-05-08 NOTE — Discharge Instructions (Signed)
See written instructions.  Resume all home medications as before surgery. You can restart the blood thinner tomorrow morning. We will continue on bactrim which I have sent to the pharmacy along with the pain medication and the anti-nausea medication.   Keep the foot elevated. Wear the wedge shoe with the walker.

## 2019-05-08 NOTE — Anesthesia Procedure Notes (Signed)
Procedure Name: MAC Date/Time: 05/08/2019 1:46 PM Performed by: Eben Burow, CRNA Pre-anesthesia Checklist: Patient identified, Emergency Drugs available, Suction available, Patient being monitored and Timeout performed Oxygen Delivery Method: Simple face mask Dental Injury: Teeth and Oropharynx as per pre-operative assessment

## 2019-05-08 NOTE — Op Note (Signed)
PATIENT:  [name]  64 y.o. male  PRE-OPERATIVE DIAGNOSIS:  OSEOMYELITIS  POST-OPERATIVE DIAGNOSIS:  OSEOMYELITIS  PROCEDURE:  Procedure(s): AMPUTATION LEFT GREAT  TOE (Left)  SURGEON:  Surgeon(s) and Role:    * Trula Slade, DPM - Primary  PHYSICIAN ASSISTANT:   ASSISTANTS: none   ANESTHESIA:   MAC  EBL:  50 mL   BLOOD ADMINISTERED:none  DRAINS: none   LOCAL MEDICATIONS USED:  Lidocaine and marcaine plain  SPECIMEN:  Source of Specimen:  bone for pathology  DISPOSITION OF SPECIMEN:  PATHOLOGY  COUNTS:  YES  TOURNIQUET:   Total Tourniquet Time Documented: Calf (Left) - 27 minutes Total: Calf (Left) - 27 minutes   DICTATION: .Viviann Spare Dictation  PLAN OF CARE: Discharge to home after PACU  PATIENT DISPOSITION:  PACU - hemodynamically stable.   Delay start of Pharmacological VTE agent (>24hrs) due to surgical blood loss or risk of bleeding: no  Indications for surgery: 64 year old male who is well-known to me who has been under the care for a wound to his left big toe and second toe.  The left second toe has been doing well however the left big toe has progressed and also developed osteomyelitis and infection.  The wound started originally while he was in [place].  The wound was present for quite some time before he had any follow-up.  Despite local wound care the wound progressed and upon x-rays last appointment osteomyelitis was identified.  Given infection recommend amputation of the toe.  Discussed all alternatives, risks, complications including pros and cons of limb salvage versus amputation.  He agrees to amputation.  No promises or guarantees were given affecting the procedure.  Procedure in detail: The patient was both verbally and visually identified by myself, the nursing staff, and the anesthesia staff.  His transfer the abdomen via stretcher and placed on the operating table in supine position.  After adequate plane of anesthesia  was obtained a well-padded calf tourniquet was applied make sure to pad all bony prominences.  The left lower extremity was then scrubbed, prepped, draped in normal sterile fashion.  After timeout was performed additional mixture of lidocaine, Marcaine plain was infiltrated in a regional block fashion.  A racquet shaped incision was planned along the first MPJ and the skin incision was made from skin to bone circumferentially around the first MPJ.  Once the joint was identified and all soft tissue structures were freed the toe was disarticulated and passed off the table.  Due to bleeding tourniquet was inflated at this point.  The metatarsal head did appear to be viable however there was no no soft tissue coverage.  Soft tissue was freed from the first metatarsal head and a sagittal saw was utilized to resect the first metatarsal head and the sesamoids were also removed.  The resection margins appear to be sharp and the remaining bone appeared to be viable.  There is no proximal tracking identified and there is no purulence or signs of abscess.  All nonviable soft tissue was debrided with a 15 by scalpel as well as a rongeur.  At this time hemostasis was achieved.  Incision was copiously irrigated saline.  The skin was then closed with 2-0, 3-0 Prolene.  Adaptic was applied followed by dry sterile dressing.  Tourniquet was released and there is found to be the immediate capillary fill time to all the digits.  He was woken from anesthesia and found to tolerate his procedure well and complications.  Transferred PACU  also stable vascular status intact.  The toe as well as the first metatarsal was sent to pathology.  He will be discharged home pending x-rays on a week of antibiotics, Bactrim.  Encouraged elevation.  Has a Darco wedge shoe at home and a walker.

## 2019-05-08 NOTE — Anesthesia Procedure Notes (Signed)
Anesthesia Regional Block: Ankle block   Pre-Anesthetic Checklist: ,, timeout performed, Correct Patient, Correct Site, Correct Laterality, Correct Procedure, Correct Position, site marked, Risks and benefits discussed,  Surgical consent,  Pre-op evaluation,  At surgeon's request and post-op pain management  Laterality: Left  Prep: chloraprep       Needles:  Injection technique: Single-shot  Needle Type: Echogenic Needle     Needle Length: 9cm      Additional Needles:   Procedures:,,,, ultrasound used (permanent image in chart),,,,  Narrative:  Start time: 05/08/2019 1:20 PM End time: 05/08/2019 1:30 PM Injection made incrementally with aspirations every 5 mL.  Performed by: Personally  Anesthesiologist: Myrtie Soman, MD  Additional Notes: Patient tolerated the procedure well without complications

## 2019-05-08 NOTE — Progress Notes (Signed)
Assisted Dr. Kalman Shan with left ankle block. Side rails up, monitors on throughout procedure. See vital signs in flow sheet. Tolerated Procedure well.

## 2019-05-08 NOTE — Anesthesia Preprocedure Evaluation (Signed)
Anesthesia Evaluation  Patient identified by MRN, date of birth, ID band Patient awake    Reviewed: Allergy & Precautions, NPO status , Patient's Chart, lab work & pertinent test results  Airway Mallampati: II  TM Distance: >3 FB Neck ROM: Full    Dental no notable dental hx.    Pulmonary shortness of breath, sleep apnea , former smoker,    Pulmonary exam normal breath sounds clear to auscultation + decreased breath sounds      Cardiovascular hypertension, +CHF  Normal cardiovascular exam+ dysrhythmias Atrial Fibrillation  Rhythm:Regular Rate:Normal     Neuro/Psych negative neurological ROS  negative psych ROS   GI/Hepatic negative GI ROS, Neg liver ROS,   Endo/Other  diabetesMorbid obesity  Renal/GU negative Renal ROS  negative genitourinary   Musculoskeletal negative musculoskeletal ROS (+)   Abdominal (+) + obese,   Peds negative pediatric ROS (+)  Hematology NHL   Anesthesia Other Findings   Reproductive/Obstetrics negative OB ROS                             Anesthesia Physical Anesthesia Plan  ASA: IV  Anesthesia Plan: MAC   Post-op Pain Management:    Induction: Intravenous  PONV Risk Score and Plan: 1 and Ondansetron  Airway Management Planned: Simple Face Mask  Additional Equipment:   Intra-op Plan:   Post-operative Plan:   Informed Consent: I have reviewed the patients History and Physical, chart, labs and discussed the procedure including the risks, benefits and alternatives for the proposed anesthesia with the patient or authorized representative who has indicated his/her understanding and acceptance.     Dental advisory given  Plan Discussed with: CRNA and Surgeon  Anesthesia Plan Comments:         Anesthesia Quick Evaluation

## 2019-05-10 ENCOUNTER — Telehealth: Payer: Self-pay | Admitting: *Deleted

## 2019-05-10 LAB — SURGICAL PATHOLOGY

## 2019-05-10 NOTE — Telephone Encounter (Signed)
I called the patient and the patient was doing good and there was no fever or chills and not any nausea and was icing and elevating and to be honest there has not been any pain and I stated to call the office if any questions or concerns and that we would see patient next week for post op appointment. Lattie Haw

## 2019-05-12 ENCOUNTER — Encounter: Payer: Self-pay | Admitting: Cardiology

## 2019-05-12 DIAGNOSIS — Z7189 Other specified counseling: Secondary | ICD-10-CM | POA: Insufficient documentation

## 2019-05-12 NOTE — Progress Notes (Signed)
Cardiology Office Note   Date:  05/13/2019   ID:  [name], DOB 08-01-1955, MRN [id number]  PCP:  [name], MD  Cardiologist:   Minus Breeding, MD   Chief Complaint  Patient presents with  . Shortness of Breath      History of Present Illness: [name] is a 64 y.o. male who presents for ongoing assessment and management of cardiomyopathy.  He has chronic shortness of breath. He has a past history of respiratory failure related to ARDS/influenza A with a reduced EF of 25% which was new, and new onset atrial fibrillation.  The EF returned to 45 - 50% on echo in Nov.  He has OSA followed by Dr. Lamonte Sakai.    Since I last saw him he had great toe amputation.  He just had his surgery and is recovering.  He gets around right now with a walking boot.  He does have to go up the stairs at the end of the day.  His wife said he is very short of breath.  He is saturating in the mid 90s when he starts at the bottom of the stairs but has not checked it when he gets to the top.  Is not describing PND or orthopnea.  He has not been having any new chest pressure, neck or arm discomfort.  There is no presyncope or syncope.   Past Medical History:  Diagnosis Date  . Acute systolic HF (heart failure) ([place])   . Arthritis    lt foot, hips knees   . Atrial fibrillation ([place])   . Diabetes mellitus, new onset ([place])   . Diverticulosis   . Dyslipidemia   . Gout attack 01/16/2012  . Hypertension   . Internal hemorrhoids   . Lymphoma ([place])   . NHL dx'd 01/2010  . Pneumonia due to COVID-19 virus 07/2018  . Pulmonary hypertension ([place])   . Sepsis ([place]) 02/2017   secondary to influenza; requiring trach  . Tubular adenoma of colon   . Venous stasis ulcers ([facility])     Past Surgical History:  Procedure Laterality Date  . AMPUTATION TOE Left 05/08/2019   Procedure: AMPUTATION LEFT GREAT  TOE;  Surgeon: Trula Slade, DPM;  Location: [facility];  Service: Podiatry;  Laterality: Left;  .  BRONCHOSCOPY    . ESOPHAGOGASTRODUODENOSCOPY ENDOSCOPY     Dr Jearld Fenton  . INGUINAL HERNIA REPAIR Left    2001  . PORTA CATH INSERTION  2011  . TRACHEOSTOMY  03/2017   with decannulation     Current Outpatient Medications  Medication Sig Dispense Refill  . acetaminophen (TYLENOL) 325 MG tablet Take 650 mg by mouth every 6 (six) hours as needed for moderate pain.     [name] Kitchen albuterol (PROVENTIL HFA;VENTOLIN HFA) 108 (90 Base) MCG/ACT inhaler Inhale 2 puffs into the lungs every 6 (six) hours as needed for wheezing or shortness of breath. 1 Inhaler 2  . allopurinol (ZYLOPRIM) 300 MG tablet Take 300 mg by mouth daily.    [name] Kitchen atorvastatin (LIPITOR) 20 MG tablet TAKE 1 TABLET BY MOUTH EVERY DAY *PT OVERDUE FOR OFFICE VISIT* (Patient taking differently: Take 20 mg by mouth daily. ) 30 tablet 5  . becaplermin (REGRANEX) 0.01 % gel Apply 1 application topically daily. Measurements right 1st toe 1.6 x 0.6 x 0.1cm 15 g 5  . carvedilol (COREG) 12.5 MG tablet Take 12.5 mg by mouth 2 (two) times daily.    . collagenase (SANTYL) ointment Apply 1 application  topically daily. Measurements to right hallux 2  X 1 x 0.1cm 30 g 5  . [employer] DIGESTIVE PROBIOTIC 250 MG capsule Take 500 mg by mouth daily.    [name] Kitchen ELIQUIS 5 MG TABS tablet TAKE 1 TABLET BY MOUTH TWICE A DAY 180 tablet 2  . furosemide (LASIX) 20 MG tablet Take 2 tablets (40 mg total) by mouth daily. 180 tablet 3  . KLOR-CON M20 20 MEQ tablet Take 2 tablets (40 mEq total) by mouth 2 (two) times daily. 360 tablet 3  . losartan (COZAAR) 25 MG tablet Take 1 tablet (25 mg total) by mouth in the morning and at bedtime. 180 tablet 3  . Melatonin 3 MG TABS Take 1 tablet by mouth at bedtime.    . Multiple Vitamin (MULTIVITAMIN WITH MINERALS) TABS tablet Take 1 tablet by mouth daily.    . mupirocin ointment (BACTROBAN) 2 % Apply 1 application topically 2 (two) times daily. 30 g 2  . olmesartan-hydrochlorothiazide (BENICAR HCT) 40-25 MG tablet Take 1 tablet by mouth daily.      [name] Kitchen omeprazole (PRILOSEC) 40 MG capsule Take 40 mg by mouth daily.     [name] Kitchen oxyCODONE-acetaminophen (PERCOCET) 5-325 MG tablet Take 1-2 tablets by mouth every 6 (six) hours as needed for severe pain. 20 tablet 0  . promethazine (PHENERGAN) 25 MG tablet Take 1 tablet (25 mg total) by mouth every 8 (eight) hours as needed for nausea or vomiting. 20 tablet 0  . sertraline (ZOLOFT) 50 MG tablet Take 1 tablet (50 mg total) by mouth daily. 30 tablet 0  . sodium chloride (OCEAN) 0.65 % SOLN nasal spray Place 2 sprays into both nostrils as needed for congestion.  0  . sulfamethoxazole-trimethoprim (BACTRIM DS) 800-160 MG tablet Take 1 tablet by mouth 2 (two) times daily. 20 tablet 0  . vitamin C (VITAMIN C) 500 MG tablet Take 1 tablet (500 mg total) by mouth 3 (three) times daily.     No current facility-administered medications for this visit.   Facility-Administered Medications Ordered in Other Visits  Medication Dose Route Frequency Provider Last Rate Last Admin  . sodium chloride 0.9 % injection 10 mL  10 mL Intravenous PRN Curt Bears, MD   10 mL at 05/04/15 1529    Allergies:   Latex and Penicillins    ROS:  Please see the history of present illness.   Otherwise, review of systems are positive for none.   All other systems are reviewed and negative.    PHYSICAL EXAM: VS:  BP 118/64 (BP Location: Right Arm, Patient Position: Sitting, Cuff Size: Large)   Pulse 61   Temp 98.1 F (36.7 C)   Ht 5\' 11"  (1.803 m)   Wt (!) 356 lb (161.5 kg)   BMI 49.65 kg/m  , BMI Body mass index is 49.65 kg/m. GENERAL:  Well appearing HEENT:  Pupils equal round and reactive, fundi not visualized, oral mucosa unremarkable NECK:  No jugular venous distention, waveform within normal limits, carotid upstroke brisk and symmetric, no bruits, no thyromegaly LYMPHATICS:  No cervical, inguinal adenopathy LUNGS:  Clear to auscultation bilaterally BACK:  No CVA tenderness CHEST:  Unremarkable HEART:  PMI not  displaced or sustained,S1 and S2 within normal limits, no S3, no clicks, no rubs, no murmurs, irregular ABD:  Flat, positive bowel sounds normal in frequency in pitch, no bruits, no rebound, no guarding, no midline pulsatile mass, no hepatomegaly, no splenomegaly EXT:  2 plus pulses throughout, moderate edema, no cyanosis no clubbing, chronic  venous stasis SKIN:  No rashes no nodules NEURO:  Cranial nerves II through XII grossly intact, motor grossly intact throughout PSYCH:  Cognitively intact, oriented to person place and time    EKG:  EKG is ordered today. The ekg ordered today demonstrates atrial fibrillation, right bundle branch block, left anterior fascicular block, heart rate 61.  No significant change from previous.   Recent Labs: 04/30/2019: ALT 16; BUN 16; Creatinine, Ser 0.99; Hemoglobin 13.0; Platelets 187; Potassium 5.0; Sodium 138    Lipid Panel    Component Value Date/Time   TRIG 122 02/26/2017 1306      Wt Readings from Last 3 Encounters:  05/13/19 (!) 356 lb (161.5 kg)  05/08/19 (!) 365 lb 15.4 oz (166 kg)  05/03/19 (!) 367 lb 6 oz (166.6 kg)      Other studies Reviewed: Additional studies/ records that were reviewed today include: Labs. Review of the above records demonstrates:  Please see elsewhere in the note.     ASSESSMENT AND PLAN:  CARDIOMYOPATHY:    The patient's ejection fraction has improved.  I am going to continue to try to titrate his medications.  Heart rate would not allow increase in his beta-blocker.  I am going to change the Cozaar 25 mg twice daily.  We talked about the need for very significant salt restriction and fluid restriction and keeping his feet elevated.  Given his lung disease and his cardiomyopathy and that I try to get him to cardiopulmonary rehab.  ATRIAL FIB:      Has had good rate control.  He tolerates anticoagulation.  No change in therapy.  DYSPNEA: I suspect this is multifactorial related to weight, deconditioning, COPD  as well as his heart disease.  We talked about this in great length.  I Georgina Peer continue to titrate his medications.  We talked about weight loss slow and steady.  I try to get him in rehab as above.  I think some   Current medicines are reviewed at length with the patient today.  The patient does not have concerns regarding medicines.  The following changes have been made:  no change  Labs/ tests ordered today include: None  Orders Placed This Encounter  Procedures  . AMB referral to cardiac rehabilitation     Disposition:   FU with me or APP in two months.     Signed, Minus Breeding, MD  05/13/2019 12:55 PM    [facility]

## 2019-05-13 ENCOUNTER — Encounter: Payer: Self-pay | Admitting: Cardiology

## 2019-05-13 ENCOUNTER — Ambulatory Visit: Payer: BC Managed Care – PPO | Admitting: Cardiology

## 2019-05-13 ENCOUNTER — Other Ambulatory Visit: Payer: Self-pay

## 2019-05-13 VITALS — BP 118/64 | HR 61 | Temp 98.1°F | Ht 71.0 in | Wt 356.0 lb

## 2019-05-13 DIAGNOSIS — Z7189 Other specified counseling: Secondary | ICD-10-CM

## 2019-05-13 DIAGNOSIS — J449 Chronic obstructive pulmonary disease, unspecified: Secondary | ICD-10-CM

## 2019-05-13 DIAGNOSIS — I420 Dilated cardiomyopathy: Secondary | ICD-10-CM | POA: Diagnosis not present

## 2019-05-13 DIAGNOSIS — I480 Paroxysmal atrial fibrillation: Secondary | ICD-10-CM | POA: Diagnosis not present

## 2019-05-13 MED ORDER — LOSARTAN POTASSIUM 25 MG PO TABS: 25.0000 mg | ORAL_TABLET | Freq: Two times a day (BID) | ORAL | 3 refills | Status: DC

## 2019-05-13 NOTE — Patient Instructions (Signed)
Medication Instructions:  INCREASE COZAAR TO 25MG  TWICE A DAY *If you need a refill on your cardiac medications before your next appointment, please call your pharmacy*  Lab Work: NONE ORDERED THIS VISIT  Testing/Procedures: NONE ORDERED THIS VISIT  Follow-Up: At [facility], you and your health needs are our priority.  As part of our continuing mission to provide you with exceptional heart care, we have created designated Provider Care Teams.  These Care Teams include your primary Cardiologist (physician) and Advanced Practice Providers (APPs -  Physician Assistants and Nurse Practitioners) who all work together to provide you with the care you need, when you need it.  Your next appointment:   2 month(s)  The format for your next appointment:   In Person  Provider:   Minus Breeding, MD

## 2019-05-14 ENCOUNTER — Other Ambulatory Visit (HOSPITAL_COMMUNITY): Payer: Self-pay | Admitting: *Deleted

## 2019-05-14 ENCOUNTER — Ambulatory Visit (INDEPENDENT_AMBULATORY_CARE_PROVIDER_SITE_OTHER): Payer: BC Managed Care – PPO | Admitting: Podiatry

## 2019-05-14 ENCOUNTER — Encounter (HOSPITAL_COMMUNITY): Payer: Self-pay | Admitting: *Deleted

## 2019-05-14 DIAGNOSIS — L97529 Non-pressure chronic ulcer of other part of left foot with unspecified severity: Secondary | ICD-10-CM

## 2019-05-14 DIAGNOSIS — I420 Dilated cardiomyopathy: Secondary | ICD-10-CM

## 2019-05-14 DIAGNOSIS — M86172 Other acute osteomyelitis, left ankle and foot: Secondary | ICD-10-CM

## 2019-05-14 NOTE — Progress Notes (Signed)
Received referral from Dr. Percival Spanish for this pt to participate in cardiac rehab with the diagnosis of dilated cardiomyopathy. Clinical review of pt follow up appt on 5/3 - cardiologist office note as well as other follow up apt with oncology, pulmonary and primary.  Pt underwent left great toe amputation on 4/28 by Dr. Jacqualyn Posey.  Pt is is in a walking boot and will have his first post op visit on today 5/4.  Pt will need to be cleared for participate in cardiac rehab with suitable shoe for exercise. Will have support staff verify insurance and benefits.  Continue to monitor pt post operative recovery and seek clearance.. Will forward to staff for follow up. [name], BSN Cardiac and Training and development officer

## 2019-05-16 ENCOUNTER — Telehealth (HOSPITAL_COMMUNITY): Payer: Self-pay

## 2019-05-16 NOTE — Telephone Encounter (Signed)
Attempted to call patient in regards to Cardiac Rehab - LM on VM 

## 2019-05-16 NOTE — Telephone Encounter (Signed)
Pt insurance is active and benefits verified through [facility]. Co-pay $0.00, DED $4,000.00/$1,253.17 met, out of pocket $8,550.00/$2,609.85 met, co-insurance 40%. No pre-authorization required. Passport, 05/16/2019 @ 4:20PM, QVO#[id number]  Will contact patient to see if he is interested in the Cardiac Rehab Program. If interested, patient will need to complete follow up appt. Once completed, patient will be contacted for scheduling upon review by the RN Navigator.

## 2019-05-17 ENCOUNTER — Other Ambulatory Visit (INDEPENDENT_AMBULATORY_CARE_PROVIDER_SITE_OTHER): Payer: BC Managed Care – PPO

## 2019-05-17 DIAGNOSIS — I10 Essential (primary) hypertension: Secondary | ICD-10-CM

## 2019-05-17 NOTE — Progress Notes (Signed)
Subjective: [name] is a 64 y.o. is seen today in office s/p male preformed on left partial 1st ray amputation.  Overall states he is doing well not having any significant pain.  Some antibiotics.  He is interested foot is much as possible keep the foot elevated.  Denies any systemic complaints such as fevers, chills, nausea, vomiting. No calf pain, chest pain, shortness of breath.   Objective: General: No acute distress, AAOx3  DP/PT pulses palpable 2/4, CRT < 3 sec to all digits.  LEFT foot: Incision is well coapted without any evidence of dehiscence with sutures intact.  There is no surrounding erythema, ascending cellulitis, fluctuance, crepitus, malodor, drainage/purulence. There is mild edema around the surgical site. There is no pain along the surgical site.  Incisions appear to be healing well any signs of infection.  The wound of the second toe appears to be healed. No other areas of tenderness to bilateral lower extremities.  No other open lesions or pre-ulcerative lesions.  No pain with calf compression, swelling, warmth, erythema.   Assessment and Plan:  Status post left partial first amputation, doing well with no complications   -Treatment options discussed including all alternatives, risks, and complications -Overall doing well.  She has been extubated.  Incisions healing adequately.  Small amount of Betadine applied followed by dry sterile dressing.  Keep the dressing clean, dry, dry. -Imaging Darco wedge shoe with a walker. -Elevate -Pain medication as needed-he has not been needing this -Monitor for any clinical signs or symptoms of infection and DVT/PE and directed to call the office immediately should any occur or go to the ER. -Follow-up as scheduled for possible suture removal or sooner if any problems arise. In the meantime, encouraged to call the office with any questions, concerns, change in symptoms.   Celesta Gentile, DPM

## 2019-05-23 ENCOUNTER — Encounter: Payer: Self-pay | Admitting: Podiatry

## 2019-05-23 ENCOUNTER — Other Ambulatory Visit: Payer: Self-pay

## 2019-05-23 ENCOUNTER — Ambulatory Visit (INDEPENDENT_AMBULATORY_CARE_PROVIDER_SITE_OTHER): Payer: BC Managed Care – PPO | Admitting: Podiatry

## 2019-05-23 VITALS — Temp 97.3°F

## 2019-05-23 DIAGNOSIS — L97529 Non-pressure chronic ulcer of other part of left foot with unspecified severity: Secondary | ICD-10-CM

## 2019-05-23 DIAGNOSIS — M86172 Other acute osteomyelitis, left ankle and foot: Secondary | ICD-10-CM

## 2019-05-28 ENCOUNTER — Encounter: Payer: Self-pay | Admitting: Podiatry

## 2019-05-28 ENCOUNTER — Other Ambulatory Visit: Payer: Self-pay

## 2019-05-28 ENCOUNTER — Ambulatory Visit (INDEPENDENT_AMBULATORY_CARE_PROVIDER_SITE_OTHER): Payer: BC Managed Care – PPO | Admitting: Podiatry

## 2019-05-28 DIAGNOSIS — L97529 Non-pressure chronic ulcer of other part of left foot with unspecified severity: Secondary | ICD-10-CM

## 2019-05-28 DIAGNOSIS — M86172 Other acute osteomyelitis, left ankle and foot: Secondary | ICD-10-CM

## 2019-05-28 NOTE — Progress Notes (Signed)
Subjective: [name] is a 64 y.o. is seen today in office s/p male preformed on left partial 1st ray amputation.  He states he did get the bandage wet but otherwise has been doing well and is having no pain.  He is here to go back to work.  Denies any systemic complaints such as fevers, chills, nausea, vomiting. No calf pain, chest pain, shortness of breath.   Objective: General: No acute distress, AAOx3  DP/PT pulses palpable 2/4, CRT < 3 sec to all digits.  LEFT foot: Incision is well coapted without any evidence of dehiscence with sutures intact however on central aspect there is no pain along the incision.  Not a clear drainage expressed there is no pus.  There is no surrounding erythema, ascending cellulitis there is no fluctuation crepitation.  There is no malodor identified today. No other areas of tenderness to bilateral lower extremities.  No other open lesions or pre-ulcerative lesions.  No pain with calf compression, swelling, warmth, erythema.   Assessment and Plan:  Status post left partial first amputation  -Treatment options discussed including all alternatives, risks, and complications -The incision still has mostly soft with sutures intact  Betadine was applied to the incision followed by dry sterile dressing.  Advised him to perform daily dressing changes with a similar dressing as well.  Continue with offloading shoe and elevation stay off the foot.  I would recommend not going back to work until further evaluation.  We will check in next week as long as incision is doing well he can sit at work that should be okay but eventually incisions healing well prior to this. -Monitor for any clinical signs or symptoms of infection and directed to call the office immediately should any occur or go to the ER.  Return in about 1 week (around 05/30/2019).  Trula Slade DPM

## 2019-06-03 ENCOUNTER — Other Ambulatory Visit: Payer: Self-pay

## 2019-06-03 ENCOUNTER — Ambulatory Visit (INDEPENDENT_AMBULATORY_CARE_PROVIDER_SITE_OTHER): Payer: BC Managed Care – PPO | Admitting: Podiatry

## 2019-06-03 DIAGNOSIS — L97529 Non-pressure chronic ulcer of other part of left foot with unspecified severity: Secondary | ICD-10-CM

## 2019-06-03 NOTE — Progress Notes (Signed)
Virtual Visit via Telephone Note  I connected with TORRAN WHIDBY on 06/03/19 at  8:00 AM EDT by telephone and verified that I am speaking with the correct person using two identifiers.  Location: Patient: Home Provider: Office   I discussed the limitations, risks, security and privacy concerns of performing an evaluation and management service by telephone and the availability of in person appointments. I also discussed with the patient that there may be a patient responsible charge related to this service. The patient expressed understanding and agreed to proceed.   History of Present Illness: 64 year old male presented with partial first amputation.  He went to return to work and I enjoyed a virtual visit with him to further evaluate the wound.  Last appointment there was some motion across the incision I want him to hold off on work.  He previously had some clear drainage but no pus.  He states that the drainage has resolved.  Denies any increase in swelling or any redness of the foot.  Denies any fevers, chills, nausea, vomiting.  No calf pain, chest pain, shortness of breath.   Observations/Objective: From observations not able to fully evaluate the wound however it does appear that there has scabbed over there is no surrounding erythema, ascending cellulitis.  They do not appreciate any malodor.  There is no pain.  Assessment and Plan: Status post partial first amputation-improving  On continue with daily dressing change with a small amount of Betadine.  Continue offloading surgical shoe.  Discussed that he can return to work and keep the foot elevated he can return to work for half days but if any worsening he needs to hold off on returning to work.  Monitor closely for any signs or symptoms of infection.  Follow Up Instructions: Follow-up as scheduled   I discussed the assessment and treatment plan with the patient. The patient was provided an opportunity to ask questions and all were  answered. The patient agreed with the plan and demonstrated an understanding of the instructions.   The patient was advised to call back or seek an in-person evaluation if the symptoms worsen or if the condition fails to improve as anticipated.  I provided 6 minutes of non-face-to-face time during this encounter.   Trula Slade, DPM

## 2019-06-03 NOTE — Progress Notes (Signed)
   Subjective: [name] is a 64 y.o. is seen today in office s/p male preformed on left partial 1st ray amputation.  He has been doing well.  He has continue with Betadine to incision daily.  He did leave the area open yesterday when it dry out and seems to be doing well.  He has no pain although he has neuropathy.  Denies any increase in swelling or redness.  Exercise the swelling has been improving since the surgery.  Some clear drainage from the distal aspect incision but no pus.  His foot has been changing the bandage. Denies any systemic complaints such as fevers, chills, nausea, vomiting. No calf pain, chest pain, shortness of breath.   Objective: General: No acute distress, AAOx3  DP/PT pulses palpable 2/4, CRT < 3 sec to all digits.  LEFT foot: Incision is well coapted without any evidence of dehiscence on the proximal aspect incision.  There was some dry skin, callus at the distal aspect.  With the sutures today he is able to debride this.  Hypergranular tissue the distal aspect incision appears to be superficial measuring approximate 3 x 1 cm.  There is no probing, undermining or tunneling.  There is no significant cellulitis or ascending cellulitis there is no warmth of the foot.  Mild chronic edema but overall improved.  No other open lesions identified.  No other open lesions or pre-ulcerative lesions.  No pain with calf compression, swelling, warmth, erythema.   Assessment and Plan:  Status post left partial first amputation; superficial dehiscence  -Treatment options discussed including all alternatives, risks, and complications -I reviewed the sutures today.  I did clean the wound.  There is no purulence in the wound appears to have some fibrotic, granulation tissue present.  Due to this we will start Santyl dressing changes daily which he already has at home.  I want to continue with surgical shoe, offloading and elevation.  He can work half days exercising keep the foot  elevated. -Monitor for any clinical signs or symptoms of infection and directed to call the office immediately should any occur or go to the ER.  Return in about 1 week   Trula Slade DPM

## 2019-06-11 ENCOUNTER — Encounter: Payer: Self-pay | Admitting: Podiatry

## 2019-06-11 ENCOUNTER — Other Ambulatory Visit: Payer: Self-pay

## 2019-06-11 ENCOUNTER — Ambulatory Visit (INDEPENDENT_AMBULATORY_CARE_PROVIDER_SITE_OTHER): Payer: BC Managed Care – PPO | Admitting: Podiatry

## 2019-06-11 VITALS — Temp 97.0°F

## 2019-06-11 DIAGNOSIS — E08621 Diabetes mellitus due to underlying condition with foot ulcer: Secondary | ICD-10-CM | POA: Diagnosis not present

## 2019-06-11 DIAGNOSIS — L97422 Non-pressure chronic ulcer of left heel and midfoot with fat layer exposed: Secondary | ICD-10-CM | POA: Diagnosis not present

## 2019-06-17 ENCOUNTER — Other Ambulatory Visit: Payer: Self-pay

## 2019-06-17 ENCOUNTER — Ambulatory Visit (INDEPENDENT_AMBULATORY_CARE_PROVIDER_SITE_OTHER): Payer: BC Managed Care – PPO | Admitting: Podiatry

## 2019-06-17 DIAGNOSIS — L97522 Non-pressure chronic ulcer of other part of left foot with fat layer exposed: Secondary | ICD-10-CM

## 2019-06-17 MED ORDER — DOXYCYCLINE HYCLATE 100 MG PO TABS: 100.0000 mg | ORAL_TABLET | Freq: Two times a day (BID) | ORAL | 0 refills | Status: DC

## 2019-06-17 NOTE — Progress Notes (Signed)
     Subjective: [name] is a 64 y.o. is seen today in office s/p male preformed on left partial 1st ray amputation.  He has some occasional bloody to clear drainage but denies any pus.  He has been leaving the wound uncovered some.  Denies any new concerns today.  Denies any fevers, chills, nausea, vomiting.  No calf pain, chest pain, shortness of breath.   Objective: General: No acute distress, AAOx3  DP/PT pulses palpable 2/4, CRT < 3 sec to all digits.  LEFT foot: Incision is well coapted without any evidence of dehiscence on the proximal aspect incision however the distal aspect of the wound with dehiscence and there is a fibrogranular wound prior to debridement.  The wound measures about the same in diameter at 3 x 1 cm there is a depth of 0.2 cm after debridement of nonviable tissue.  There is no surrounding erythema but there is surrounding macerated tissue and slight malodor.  There is no ascending cellulitis and there is no fluctuation crepitation.  No other open lesions or pre-ulcerative lesions.  No pain with calf compression, swelling, warmth, erythema.   Assessment and Plan:  Status post left partial first amputation; superficial dehiscence  -Treatment options discussed including all alternatives, risks, and complications -Sharply debrided the wound today utilizing the 312 with scalpel down to healthy, bleeding, viable granular tissue.  Continue with Santyl dressing changes daily.  Continue elevation and remaining offloading shoe.  Prescribed Bactrim given the macerated tissue and slight malodor. -Monitor for any clinical signs or symptoms of infection and directed to call the office immediately should any occur or go to the ER.  No follow-ups on file.  Trula Slade DPM

## 2019-06-18 NOTE — Progress Notes (Signed)
° °  Subjective: [name] is a 64 y.o. is seen today in office s/p male preformed on left partial 1st ray amputation.  He has been continue with Santyl dressing changes daily.  He thinks the foot is looking good.  Denies any increase in swelling any redness and denies any purulence.  No red streaks.  Denies any fevers, chills, nausea, vomiting.  No calf pain, chest pain, shortness of breath.   Objective: General: No acute distress, AAOx3  DP/PT pulses palpable 2/4, CRT < 3 sec to all digits.  LEFT foot: Incision is well coapted without any evidence of dehiscence on the proximal aspect incision.  At the distal aspect of the incision is fibrotic wound base with hyperkeratotic periwound.  Prior to debridement the wound measured 3 x 1.4 cm and after to be measured 3 x 1.5 cm with a depth of 0.3.  There is no probing to bone, undermining or tunneling there is no surrounding erythema, ascending cellulitis there is no fluctuation crepitation peer there is no malodor.  No other open lesions or pre-ulcerative lesions.  No pain with calf compression, swelling, warmth, erythema.   Assessment and Plan:  Status post left partial first amputation; superficial dehiscence  -Treatment options discussed including all alternatives, risks, and complications -I sharply debrided the wound to utilizing the 312 with scalpel down to healthy, bleeding, viable tissue with minimal blood loss to remove nonviable tissue.  Already continue with Santyl dressing changes.  Over the wound will progress.  Monitor closely any signs or symptoms of infection.  See him back in 1 week or sooner if needed.  No follow-ups on file.  Trula Slade DPM

## 2019-06-24 ENCOUNTER — Ambulatory Visit (INDEPENDENT_AMBULATORY_CARE_PROVIDER_SITE_OTHER): Payer: BC Managed Care – PPO | Admitting: Podiatry

## 2019-06-24 ENCOUNTER — Other Ambulatory Visit: Payer: Self-pay

## 2019-06-24 DIAGNOSIS — L97522 Non-pressure chronic ulcer of other part of left foot with fat layer exposed: Secondary | ICD-10-CM | POA: Diagnosis not present

## 2019-07-01 ENCOUNTER — Ambulatory Visit (INDEPENDENT_AMBULATORY_CARE_PROVIDER_SITE_OTHER): Payer: BC Managed Care – PPO | Admitting: Podiatry

## 2019-07-01 ENCOUNTER — Other Ambulatory Visit: Payer: Self-pay

## 2019-07-01 DIAGNOSIS — L97522 Non-pressure chronic ulcer of other part of left foot with fat layer exposed: Secondary | ICD-10-CM | POA: Diagnosis not present

## 2019-07-02 ENCOUNTER — Telehealth (HOSPITAL_COMMUNITY): Payer: Self-pay | Admitting: *Deleted

## 2019-07-02 NOTE — Progress Notes (Signed)
Subjective: [name] is a 64 y.o. is seen today in office s/p male preformed on left partial 1st ray amputation resulting in dehiscence along the distal aspect incision.  He has been using Iodosorb and thinks this is been doing better than Santyl.  He has not noticed any severe drainage or drainage.  He did wear the surgical shoe, wedge.  He did take Bactrim without any side effects.  He has some occasional bloody to clear drainage but denies any pus.  He has been leaving the wound uncovered some.  Denies any new concerns today.  Denies any fevers, chills, nausea, vomiting.  No calf pain, chest pain, shortness of breath.   Objective: General: No acute distress, AAOx3  DP/PT pulses palpable 2/4, CRT < 3 sec to all digits.  LEFT foot: Incision is well coapted without any evidence of dehiscence on the proximal aspect incision however the distal aspect of the wound with dehiscence and there is a fibrogranular wound prior to debridement.  The wound measures about the same in diameter at 2.6 x 1.1 cm there is a depth of 0.2 cm after debridement of nonviable tissue.  Prior to debridement the wound appeared to be more superficial.  I debrided all nonviable tissue down to healthy, granular, bleeding tissue.  There is no surrounding erythema.  There is no ascending cellulitis and there is no fluctuation crepitation.  No other open lesions or pre-ulcerative lesions.  No pain with calf compression, swelling, warmth, erythema.   Assessment and Plan:  Status post left partial first amputation; superficial dehiscence  -Treatment options discussed including all alternatives, risks, and complications -Sharply debrided the wound today utilizing the 312 with scalpel down to healthy, bleeding, viable granular tissue.  Continue with Iodosorb dressing changes daily.  Continue elevation and remaining offloading shoe.  He can wash the area soap and water daily and dry thoroughly. -Continue Darco wedge shoe -Monitor for  any clinical signs or symptoms of infection and directed to call the office immediately should any occur or go to the ER.  No follow-ups on file.  Trula Slade DPM

## 2019-07-02 NOTE — Telephone Encounter (Signed)
Pt seen in follow up by podiatry on yesterday.  Called pt to see how he felt he was doing and readiness for proceeding with scheduling CR.  Pt remains in a boot and on a walker.  Pt is unsure of how much longer he will be unable to bear weight and wear the boot.  Pt asked that I check back in a month. [name], BSN Cardiac and Training and development officer

## 2019-07-07 NOTE — Progress Notes (Signed)
Subjective: [name] is a 64 y.o. is seen today in office s/p  left partial 1st ray amputation resulting in dehiscence along the distal aspect incision.  He has been keeping iodosorb on the wound daily.  He denies any significant drainage or pus.  He thinks the wound is healing well the foot looks better.  Denies any drainage or pus.  No significant increase in swelling or redness. Denies any new concerns today.  Denies any fevers, chills, nausea, vomiting.  No calf pain, chest pain, shortness of breath.  Objective: General: No acute distress, AAOx3  DP/PT pulses palpable 2/4, CRT < 3 sec to all digits.  LEFT foot: Incision is well coapted without any evidence of dehiscence on the proximal aspect incision however the distal aspect of the wound with dehiscence and there is a fibrogranular wound prior to debridement.  The wound measures about the same at 2.5 x 1.1 cm.  After debridement there is granular tissue with a depth of 0.2 cm.  There is no probing to bone, undermining or tunneling.  No exposed tendon.  There is faint surrounding erythema and I think this is more from inflammation as opposed to infection.  There is no ascending cellulitis or warmth. No other open lesions or pre-ulcerative lesions.  No pain with calf compression, swelling, warmth, erythema.       Assessment and Plan:  Status post left partial first amputation; superficial dehiscence  -Treatment options discussed including all alternatives, risks, and complications -Sharply debrided the wound today utilizing the 312 with scalpel down to healthy, bleeding, viable granular tissue.  Continue with Iodosorb dressing changes daily however it was packed with this, and to use less of this and make sure he washes it daily..  Continue elevation and remaining offloading shoe.  -Continue Darco wedge shoe -Monitor for any clinical signs or symptoms of infection and directed to call the office immediately should any occur or go to the  ER.  No follow-ups on file.  Trula Slade DPM

## 2019-07-08 ENCOUNTER — Ambulatory Visit (INDEPENDENT_AMBULATORY_CARE_PROVIDER_SITE_OTHER): Payer: BC Managed Care – PPO | Admitting: Podiatry

## 2019-07-08 ENCOUNTER — Other Ambulatory Visit: Payer: Self-pay

## 2019-07-08 DIAGNOSIS — L97422 Non-pressure chronic ulcer of left heel and midfoot with fat layer exposed: Secondary | ICD-10-CM

## 2019-07-08 DIAGNOSIS — L97522 Non-pressure chronic ulcer of other part of left foot with fat layer exposed: Secondary | ICD-10-CM | POA: Diagnosis not present

## 2019-07-08 DIAGNOSIS — E08621 Diabetes mellitus due to underlying condition with foot ulcer: Secondary | ICD-10-CM

## 2019-07-10 DIAGNOSIS — G4733 Obstructive sleep apnea (adult) (pediatric): Secondary | ICD-10-CM | POA: Diagnosis not present

## 2019-07-12 DIAGNOSIS — R0600 Dyspnea, unspecified: Secondary | ICD-10-CM | POA: Diagnosis not present

## 2019-07-12 DIAGNOSIS — J189 Pneumonia, unspecified organism: Secondary | ICD-10-CM | POA: Diagnosis not present

## 2019-07-12 DIAGNOSIS — I10 Essential (primary) hypertension: Secondary | ICD-10-CM | POA: Diagnosis not present

## 2019-07-12 DIAGNOSIS — Z1152 Encounter for screening for COVID-19: Secondary | ICD-10-CM | POA: Diagnosis not present

## 2019-07-12 DIAGNOSIS — R059 Cough, unspecified: Secondary | ICD-10-CM | POA: Insufficient documentation

## 2019-07-19 ENCOUNTER — Telehealth: Payer: Self-pay | Admitting: Podiatry

## 2019-07-19 NOTE — Telephone Encounter (Signed)
Called patient and left voicemail offering an appt for Monday, 07/22/19 @ 2:15PM with Dr.Wagoner for Amputation follow up.  ** Patient called earlier in the week requesting an appt for Monday afternoon and at the time no afternoon appts were available. Patient was offered multiple other times/dates but declined. Patient ask to be placed on Cancellation List. **

## 2019-07-21 DIAGNOSIS — I5022 Chronic systolic (congestive) heart failure: Secondary | ICD-10-CM | POA: Insufficient documentation

## 2019-07-21 NOTE — Progress Notes (Signed)
Cardiology Office Note   Date:  07/22/2019   ID:  [name], DOB 1955/08/25, MRN [id number]  PCP:  [name] Redwood, MD  Cardiologist:   [name] Breeding, MD   No chief complaint on file.     History of Present Illness: [name] is a 64 y.o. male who presents for ongoing assessment and management of cardiomyopathy.  He has chronic shortness of breath. He has a past history of respiratory failure related to ARDS/influenza A with a reduced EF of 25% which was new, and new onset atrial fibrillation.  The EF returned to 45 - 50% on echo in Nov.  He has OSA followed by Dr. Lamonte Sakai.   At the last visit I increased his Cozaar.    Since I last saw him he has done okay.  He did report he had pneumonia about 9 days ago and was treated with p.o. antibiotics.  He wakes up with shortness of breath and knows that something is going on.  He alerted his primary care who treated him.  He otherwise is done okay.  He said no new symptoms related to slightly reduced ejection fraction.  He tolerated the increased anticoagulation without lightheadedness, presyncope or syncope.  He gets around with a walker as he is continuing to heal his foot.  He had great toe amputation.     Past Medical History:  Diagnosis Date  . Acute systolic HF (heart failure) ([place])   . Arthritis    lt foot, hips knees   . Atrial fibrillation ([place])   . Diabetes mellitus, new onset ([place])   . Diverticulosis   . Dyslipidemia   . Gout attack 01/16/2012  . Hypertension   . Internal hemorrhoids   . Lymphoma ([place])   . NHL dx'd 01/2010  . Pneumonia due to COVID-19 virus 07/2018  . Pulmonary hypertension ([place])   . Sepsis ([place]) 02/2017   secondary to influenza; requiring trach  . Tubular adenoma of colon   . Venous stasis ulcers ([facility])     Past Surgical History:  Procedure Laterality Date  . AMPUTATION TOE Left 05/08/2019   Procedure: AMPUTATION LEFT GREAT  TOE;  Surgeon: [name], DPM;  Location: [facility];  Service:  Podiatry;  Laterality: Left;  . BRONCHOSCOPY    . ESOPHAGOGASTRODUODENOSCOPY ENDOSCOPY     Dr [name]  . INGUINAL HERNIA REPAIR Left    2001  . PORTA CATH INSERTION  2011  . TRACHEOSTOMY  03/2017   with decannulation     Current Outpatient Medications  Medication Sig Dispense Refill  . acetaminophen (TYLENOL) 325 MG tablet Take 650 mg by mouth every 6 (six) hours as needed for moderate pain.     [name] Kitchen albuterol (PROVENTIL HFA;VENTOLIN HFA) 108 (90 Base) MCG/ACT inhaler Inhale 2 puffs into the lungs every 6 (six) hours as needed for wheezing or shortness of breath. 1 Inhaler 2  . allopurinol (ZYLOPRIM) 300 MG tablet Take 300 mg by mouth daily.    [name] Kitchen atorvastatin (LIPITOR) 20 MG tablet TAKE 1 TABLET BY MOUTH EVERY DAY *PT OVERDUE FOR OFFICE VISIT* 30 tablet 5  . becaplermin (REGRANEX) 0.01 % gel Apply 1 application topically daily. Measurements right 1st toe 1.6 x 0.6 x 0.1cm 15 g 5  . carvedilol (COREG) 12.5 MG tablet Take 12.5 mg by mouth 2 (two) times daily.    . collagenase (SANTYL) ointment Apply 1 application topically daily. Measurements to right hallux 2  X 1 x 0.1cm 30 g 5  .  [employer] DIGESTIVE PROBIOTIC 250 MG capsule Take 500 mg by mouth daily.    [name] Kitchen ELIQUIS 5 MG TABS tablet TAKE 1 TABLET BY MOUTH TWICE A DAY 180 tablet 2  . furosemide (LASIX) 20 MG tablet Take 2 tablets (40 mg total) by mouth daily. 180 tablet 3  . KLOR-CON M20 20 MEQ tablet Take 2 tablets (40 mEq total) by mouth 2 (two) times daily. 360 tablet 3  . losartan (COZAAR) 50 MG tablet Take 1 tablet (50 mg total) by mouth in the morning and at bedtime. 180 tablet 1  . Multiple Vitamin (MULTIVITAMIN WITH MINERALS) TABS tablet Take 1 tablet by mouth daily.    . mupirocin ointment (BACTROBAN) 2 % Apply 1 application topically 2 (two) times daily. 30 g 2  . olmesartan-hydrochlorothiazide (BENICAR HCT) 40-25 MG tablet Take 1 tablet by mouth daily.    [name] Kitchen omeprazole (PRILOSEC) 40 MG capsule Take 40 mg by mouth daily.     [name] Kitchen  oxyCODONE-acetaminophen (PERCOCET) 5-325 MG tablet Take 1-2 tablets by mouth every 6 (six) hours as needed for severe pain. 20 tablet 0  . sertraline (ZOLOFT) 50 MG tablet Take 1 tablet (50 mg total) by mouth daily. 30 tablet 0  . sodium chloride (OCEAN) 0.65 % SOLN nasal spray Place 2 sprays into both nostrils as needed for congestion.  0  . vitamin C (VITAMIN C) 500 MG tablet Take 1 tablet (500 mg total) by mouth 3 (three) times daily.     No current facility-administered medications for this visit.   Facility-Administered Medications Ordered in Other Visits  Medication Dose Route Frequency Provider Last Rate Last Admin  . sodium chloride 0.9 % injection 10 mL  10 mL Intravenous PRN Curt Bears, MD   10 mL at 05/04/15 1529    Allergies:   Latex and Penicillins    ROS:  Please see the history of present illness.   Otherwise, review of systems are positive for none .   All other systems are reviewed and negative.    PHYSICAL EXAM: VS:  BP 120/74   Pulse 77   Ht 5\' 11"  (1.803 m)   Wt (!) 363 lb 6.4 oz (164.8 kg)   SpO2 97%   BMI 50.68 kg/m  , BMI Body mass index is 50.68 kg/m. GENERAL:  Well appearing NECK:  No jugular venous distention, waveform within normal limits, carotid upstroke brisk and symmetric, no bruits, no thyromegaly LUNGS:  Clear to auscultation bilaterally CHEST:  Unremarkable HEART:  PMI not displaced or sustained,S1 and S2 within normal limits, no S3,  no clicks, no rubs, no murmurs, irregular ABD:  Flat, positive bowel sounds normal in frequency in pitch, no bruits, no rebound, no guarding, no midline pulsatile mass, no hepatomegaly, no splenomegaly EXT:  2 plus pulses upper and decreased dorsalis pedis and posterior tibials bilaterally, mild bilateral lower extremity edema edema, no cyanosis no clubbing, chronic venous stasis changes  EKG:  EKG is not ordered today.   Recent Labs: 04/30/2019: ALT 16; BUN 16; Creatinine, Ser 0.99; Hemoglobin 13.0;  Platelets 187; Potassium 5.0; Sodium 138    Lipid Panel    Component Value Date/Time   TRIG 122 02/26/2017 1306      Wt Readings from Last 3 Encounters:  07/22/19 (!) 363 lb 6.4 oz (164.8 kg)  05/13/19 (!) 356 lb (161.5 kg)  05/08/19 (!) 365 lb 15.4 oz (166 kg)      Other studies Reviewed: Additional studies/ records that were reviewed today include: None. Review  of the above records demonstrates:  NA  ASSESSMENT AND PLAN:  CARDIOMYOPATHY:     I am going to try to increase his Cozaar to 50 mg twice daily.  He has had borderline potassium so he should get a basic metabolic profile in 2 weeks.  He will remain on the other meds as listed.  At the next visit perhaps we can increase his beta-blocker.  ATRIAL FIB:      Has had good rate control.  He tolerates anticoagulation.  No change in therapy.   DYSPNEA:    I think this is multifactorial with weight, deconditioning, COPD as well as his cardiomyopathy.  I will continue to titrate his meds.  COVID EDUCATION: He has been vaccinated.   Current medicines are reviewed at length with the patient today.  The patient does not have concerns regarding medicines.  The following changes have been made:  As above  Labs/ tests ordered today include:   Orders Placed This Encounter  Procedures  . Basic metabolic panel     Disposition:   FU with me or APP in 3 months for med titration.     Signed, [name] Breeding, MD  07/22/2019 2:23 PM    [place]

## 2019-07-22 ENCOUNTER — Ambulatory Visit: Payer: BC Managed Care – PPO | Admitting: Cardiology

## 2019-07-22 ENCOUNTER — Encounter: Payer: Self-pay | Admitting: Cardiology

## 2019-07-22 ENCOUNTER — Encounter: Payer: Self-pay | Admitting: Podiatry

## 2019-07-22 ENCOUNTER — Ambulatory Visit: Payer: BC Managed Care – PPO | Admitting: Podiatry

## 2019-07-22 ENCOUNTER — Ambulatory Visit (INDEPENDENT_AMBULATORY_CARE_PROVIDER_SITE_OTHER): Payer: BC Managed Care – PPO | Admitting: Podiatry

## 2019-07-22 ENCOUNTER — Other Ambulatory Visit: Payer: Self-pay

## 2019-07-22 VITALS — BP 120/74 | HR 77 | Ht 71.0 in | Wt 363.4 lb

## 2019-07-22 DIAGNOSIS — I5022 Chronic systolic (congestive) heart failure: Secondary | ICD-10-CM

## 2019-07-22 DIAGNOSIS — R0602 Shortness of breath: Secondary | ICD-10-CM

## 2019-07-22 DIAGNOSIS — L97522 Non-pressure chronic ulcer of other part of left foot with fat layer exposed: Secondary | ICD-10-CM

## 2019-07-22 DIAGNOSIS — I482 Chronic atrial fibrillation, unspecified: Secondary | ICD-10-CM

## 2019-07-22 DIAGNOSIS — L97422 Non-pressure chronic ulcer of left heel and midfoot with fat layer exposed: Secondary | ICD-10-CM

## 2019-07-22 DIAGNOSIS — Z7189 Other specified counseling: Secondary | ICD-10-CM

## 2019-07-22 DIAGNOSIS — E08621 Diabetes mellitus due to underlying condition with foot ulcer: Secondary | ICD-10-CM

## 2019-07-22 MED ORDER — LOSARTAN POTASSIUM 50 MG PO TABS: 50.0000 mg | ORAL_TABLET | Freq: Two times a day (BID) | ORAL | 1 refills | Status: DC

## 2019-07-22 NOTE — Patient Instructions (Signed)
Medication Instructions:  Increase Losartan to 50 mg twice daily   *If you need a refill on your cardiac medications before your next appointment, please call your pharmacy*   Follow-Up: At [facility], you and your health needs are our priority.  As part of our continuing mission to provide you with exceptional heart care, we have created designated Provider Care Teams.  These Care Teams include your primary Cardiologist (physician) and Advanced Practice Providers (APPs -  Physician Assistants and Nurse Practitioners) who all work together to provide you with the care you need, when you need it.  We recommend signing up for the patient portal called "MyChart".  Sign up information is provided on this After Visit Summary.  MyChart is used to connect with patients for Virtual Visits (Telemedicine).  Patients are able to view lab/test results, encounter notes, upcoming appointments, etc.  Non-urgent messages can be sent to your provider as well.   To learn more about what you can do with MyChart, go to [email or web address].    Your next appointment:   3 month(s)  The format for your next appointment:   In Person  Provider:   Minus Breeding, MD

## 2019-07-22 NOTE — Progress Notes (Signed)
Subjective: NALIN MAZZOCCO is a 64 y.o. is seen today in office s/p  left partial 1st ray amputation resulting in dehiscence along the distal aspect incision.  He states that overall he is feeling better and the wound is healing.  Continue with Iodosorb dressing changes daily.  Denies any significant drainage or any pus.  He denies any increase in swelling.  No redness of the foot he is noted or any red streaks.  Denies any fevers, chills, nausea, vomiting.  No calf pain, chest pain, shortness of breath.   Objective: General: No acute distress, AAOx3  DP/PT pulses palpable 2/4, CRT < 3 sec to all digits.  LEFT foot: Incision is well coapted without any evidence of dehiscence on the proximal aspect incision however the distal aspect of the wound with dehiscence and there is a fibrogranular wound prior to debridement.  The wound measures about the same in diameter 2.4 x 1.1 cm but however it is more superficial.  After debridement there is a granular wound base.  There is no probing to bone, drainage or pus there is no exposed tendon.  No significant surrounding erythema, ascending cellulitis.  No fluctuation crepitation peer there is no malodor.  No other open lesions or pre-ulcerative lesions.  No pain with calf compression, swelling, warmth, erythema.   Assessment and Plan:  Status post left partial first amputation; dehiscence  -Treatment options discussed including all alternatives, risks, and complications -Sharply debrided the wound today utilizing the 312 with scalpel down to healthy, bleeding, viable granular tissue.  Continue with Iodosorb dressing changes daily as this seems to be doing better than Santyl.  Discussed possibly return to the operating for grafting if no improvement. -Continue Darco wedge shoe -Monitor for any clinical signs or symptoms of infection and directed to call the office immediately should any occur or go to the ER.  Follow-up in 2 weeks or sooner if  needed.  Trula Slade DPM

## 2019-07-29 NOTE — Progress Notes (Signed)
Subjective: [name] is a 64 y.o. is seen today in office s/p  left partial 1st ray amputation resulting in dehiscence along the distal aspect incision.  He feels the wound is healed.  Denies any drainage or pus.  Is been keeping it is open the wound daily.  Denies any surrounding redness or red streaks or any changes otherwise. Denies any fevers, chills, nausea, vomiting.  No calf pain, chest pain, shortness of breath.   Objective: General: No acute distress, AAOx3  DP/PT pulses palpable 2/4, CRT < 3 sec to all digits.  LEFT foot: Incision is well coapted without any evidence of dehiscence on the proximal aspect incision however the distal aspect of the wound with dehiscence and there is a fibrogranular wound prior to debridement.  The wound measures about the same in diameter 1.6 x 1 cm after debridement.  After debridement there is a granular wound base.  There is no probing to bone, drainage or pus there is no exposed tendon.  No significant surrounding erythema, ascending cellulitis.  No fluctuation crepitation peer there is no malodor.  No other open lesions or pre-ulcerative lesions.  No pain with calf compression, swelling, warmth, erythema.   Assessment and Plan:  Status post left partial first amputation; dehiscence  -Treatment options discussed including all alternatives, risks, and complications -Wound is still evident.  I was able to sharply debride the wound today listening to 3 toe with scalpel to any complications of the healthy, bleeding, viable granular tissue.  Continue with daily dressing changes. -Continue Darco wedge shoe -Monitor for any clinical signs or symptoms of infection and directed to call the office immediately should any occur or go to the ER.  Trula Slade DPM

## 2019-08-02 ENCOUNTER — Telehealth: Payer: Self-pay | Admitting: Internal Medicine

## 2019-08-02 NOTE — Telephone Encounter (Signed)
Release: [id number] Printed older records from [facility] and created a new request to print and fax newer records along with the older records TO [place] @ [phone].

## 2019-08-05 ENCOUNTER — Ambulatory Visit (INDEPENDENT_AMBULATORY_CARE_PROVIDER_SITE_OTHER): Payer: BC Managed Care – PPO | Admitting: Podiatry

## 2019-08-05 ENCOUNTER — Other Ambulatory Visit: Payer: Self-pay

## 2019-08-05 DIAGNOSIS — L97522 Non-pressure chronic ulcer of other part of left foot with fat layer exposed: Secondary | ICD-10-CM

## 2019-08-06 ENCOUNTER — Ambulatory Visit: Payer: BC Managed Care – PPO | Admitting: Podiatry

## 2019-08-11 NOTE — Progress Notes (Signed)
Subjective: [name] is a 64 y.o. is seen today in office s/p  left partial 1st ray amputation resulting in dehiscence along the distal aspect incision.  He can continue with Prisma dressing changes daily.  He feels that the wound is doing better.  Denies any drainage or pus or any surrounding redness or red streaks.  No pain. Denies any fevers, chills, nausea, vomiting.  No calf pain, chest pain, shortness of breath.   Objective: General: No acute distress, AAOx3  DP/PT pulses palpable 2/4, CRT < 3 sec to all digits.  LEFT foot: Incision is well coapted without any evidence of dehiscence on the proximal aspect incision however the distal aspect of the wound with dehiscence and there is a fibrogranular wound prior to debridement.  After debridement the wound measures 1.5 x 1 cm however appears to be filling in morning is more superficial.  After debridement there is a granular wound base.  There is no probing to bone or exposed tendon.  There is no surrounding erythema, ascending cellulitis there is no fluctuation capitation.  There is no malodor. No other open lesions or pre-ulcerative lesions.  No pain with calf compression, swelling, warmth, erythema.   Assessment and Plan:  Status post left partial first amputation; dehiscence  -Treatment options discussed including all alternatives, risks, and complications -Wound is still evident however somewhat smaller today.  I sharply debrided the wound today utilizing a #312 blade scalpel to any complications of the healthy, bleeding, viable granular tissue.  Continue with daily dressing changes. -Continue Darco wedge shoe -Encourage elevation and limit activity.  Should only be working part-time. -Monitor for any clinical signs or symptoms of infection and directed to call the office immediately should any occur or go to the ER.  Trula Slade DPM

## 2019-08-19 ENCOUNTER — Other Ambulatory Visit: Payer: Self-pay

## 2019-08-19 ENCOUNTER — Ambulatory Visit (INDEPENDENT_AMBULATORY_CARE_PROVIDER_SITE_OTHER): Payer: BC Managed Care – PPO | Admitting: Podiatry

## 2019-08-19 DIAGNOSIS — L97422 Non-pressure chronic ulcer of left heel and midfoot with fat layer exposed: Secondary | ICD-10-CM | POA: Diagnosis not present

## 2019-08-19 DIAGNOSIS — E08621 Diabetes mellitus due to underlying condition with foot ulcer: Secondary | ICD-10-CM | POA: Diagnosis not present

## 2019-08-19 DIAGNOSIS — L97522 Non-pressure chronic ulcer of other part of left foot with fat layer exposed: Secondary | ICD-10-CM | POA: Diagnosis not present

## 2019-08-26 NOTE — Progress Notes (Signed)
Subjective: [name] is a 64 y.o. is seen today in office s/p  left partial 1st ray amputation resulting in dehiscence along the distal aspect incision.  He has been continuing daily dressing changes with Prisma.  Overall states that the wound has been doing better.  Denies any drainage or pus or any surrounding redness or red streaks.  He has no pain.  Denies any systemic concerns including fevers, chills, nausea, vomiting.  No calf pain, chest pain, shortness of breath.    Objective: General: No acute distress, AAOx3  DP/PT pulses palpable 2/4, CRT < 3 sec to all digits.  LEFT foot: Wound evident still at the distal aspect incision.  Appears to be smaller today measuring 1.4 x 1 but is almost superficial to the skin.  There is no surrounding erythema, ascending cellulitis but there is no fluctuation or crepitation and there is no malodor.  No other open lesions. No other open lesions or pre-ulcerative lesions.  No pain with calf compression, swelling, warmth, erythema.   Assessment and Plan:  Status post left partial first amputation; dehiscence  -Treatment options discussed including all alternatives, risks, and complications -Wound still present but getting somewhat smaller although slowly.  I would sharply debrided the wound today utilizing #312 blade scalpel without any complications on the healthy, bleeding, viable tissues.  Continue with Prisma daily dressing changes and offloading.  Dressing off the foot is much as possible. -Continue Darco wedge shoe -Encourage elevation and limit activity.  Should only be working part-time. -Monitor for any clinical signs or symptoms of infection and directed to call the office immediately should any occur or go to the ER.  Trula Slade DPM

## 2019-09-02 ENCOUNTER — Encounter: Payer: Self-pay | Admitting: Podiatry

## 2019-09-02 ENCOUNTER — Other Ambulatory Visit: Payer: Self-pay

## 2019-09-02 ENCOUNTER — Ambulatory Visit (INDEPENDENT_AMBULATORY_CARE_PROVIDER_SITE_OTHER): Payer: BC Managed Care – PPO | Admitting: Podiatry

## 2019-09-02 DIAGNOSIS — L97422 Non-pressure chronic ulcer of left heel and midfoot with fat layer exposed: Secondary | ICD-10-CM

## 2019-09-02 DIAGNOSIS — L97522 Non-pressure chronic ulcer of other part of left foot with fat layer exposed: Secondary | ICD-10-CM | POA: Diagnosis not present

## 2019-09-02 DIAGNOSIS — E08621 Diabetes mellitus due to underlying condition with foot ulcer: Secondary | ICD-10-CM

## 2019-09-02 MED ORDER — SILVER SULFADIAZINE 1 % EX CREA
1.0000 | TOPICAL_CREAM | Freq: Every day | CUTANEOUS | 2 refills | Status: DC
Start: 2019-09-02 — End: 2020-08-04

## 2019-09-08 NOTE — Progress Notes (Signed)
Subjective: [name] is a 64 y.o. is seen today in office s/p  left partial 1st ray amputation resulting in dehiscence along the distal aspect incision.  Incision the wound is doing well.  Is been still applying Prisma to the wound.  He thinks the wound is healing.  Denies any drainage or pus any surrounding redness or red streaks. Denies any systemic concerns including fevers, chills, nausea, vomiting.  No calf pain, chest pain, shortness of breath.    Objective: General: No acute distress, AAOx3  DP/PT pulses palpable 2/4, CRT < 3 sec to all digits.  LEFT foot: Wound evident still at the distal aspect incision.  Appears to be smaller today measuring 1.2 x 1.1 and is superficial with a granular wound base after debridement.  There is no surrounding erythema, ascending cellulitis there is no fluctuation crepitation peer there is no malodor. No pain with calf compression, swelling, warmth, erythema.   Assessment and Plan:  Status post left partial first amputation; dehiscence  -Treatment options discussed including all alternatives, risks, and complications -Sharply debrided the wound today utilizing #312 blade scalpel without any complications on the healthy, bleeding, viable tissues.  We will switch to using Silvadene dressing changes daily.  Encourage elevation dressing off the foot is much as possible. -Continue Darco wedge shoe -Encourage elevation and limit activity.  Should only be working part-time. -Monitor for any clinical signs or symptoms of infection and directed to call the office immediately should any occur or go to the ER.  Trula Slade DPM

## 2019-09-23 ENCOUNTER — Ambulatory Visit (INDEPENDENT_AMBULATORY_CARE_PROVIDER_SITE_OTHER): Payer: BC Managed Care – PPO | Admitting: Podiatry

## 2019-09-23 ENCOUNTER — Other Ambulatory Visit: Payer: Self-pay

## 2019-09-23 ENCOUNTER — Other Ambulatory Visit: Payer: Self-pay | Admitting: Cardiology

## 2019-09-23 VITALS — BP 171/116 | HR 80 | Temp 96.4°F

## 2019-09-23 DIAGNOSIS — L97522 Non-pressure chronic ulcer of other part of left foot with fat layer exposed: Secondary | ICD-10-CM

## 2019-09-27 NOTE — Progress Notes (Signed)
Subjective: [name] is a 64 y.o. is seen today in office s/p  left partial 1st ray amputation resulting in dehiscence along the distal aspect incision.  He has been using Silvadene cream to the area daily and he feels that the wound is doing better.  Denies any drainage or pus or any swelling or redness.  Currently denies any fevers, chills, nausea, vomiting.  No calf pain, chest pain, shortness of breath.  Objective: General: No acute distress, AAOx3  DP/PT pulses palpable 2/4, CRT < 3 sec to all digits.  LEFT foot: Wound evident still at the distal aspect incision.  Appears to be smaller today measuring 1 x 0.8 and is superficial with a granular wound base after debridement.  There is no surrounding erythema, ascending cellulitis there is no fluctuation crepitation peer there is no malodor. No pain with calf compression, swelling, warmth, erythema.   Assessment and Plan:  Status post left partial first amputation; ulceration  -Treatment options discussed including all alternatives, risks, and complications -Sharply debrided the wound today utilizing #312 blade scalpel without any complications on the healthy, bleeding, viable tissues.  Continue Silvadene dressing changes daily.  Continue with surgical shoe, offloading, elevation.  -Encourage elevation and limit activity.  Should only be working part-time. -Monitor for any clinical signs or symptoms of infection and directed to call the office immediately should any occur or go to the ER.  Trula Slade DPM

## 2019-10-14 ENCOUNTER — Other Ambulatory Visit: Payer: Self-pay

## 2019-10-14 ENCOUNTER — Ambulatory Visit (INDEPENDENT_AMBULATORY_CARE_PROVIDER_SITE_OTHER): Payer: BC Managed Care – PPO | Admitting: Podiatry

## 2019-10-14 DIAGNOSIS — L97522 Non-pressure chronic ulcer of other part of left foot with fat layer exposed: Secondary | ICD-10-CM | POA: Diagnosis not present

## 2019-10-14 DIAGNOSIS — E08621 Diabetes mellitus due to underlying condition with foot ulcer: Secondary | ICD-10-CM | POA: Diagnosis not present

## 2019-10-14 DIAGNOSIS — L97422 Non-pressure chronic ulcer of left heel and midfoot with fat layer exposed: Secondary | ICD-10-CM

## 2019-10-15 NOTE — Progress Notes (Signed)
Subjective: [name] is a 64 y.o. is seen today for follow-up evaluation of a wound on the left foot. Has been continuing Silvadene dressing changes daily he thinks the wound is doing better and looks good. Denies any drainage or pus or any swelling or redness. He has no fevers, chills, nausea vomiting. No calf pain, chest pain, shortness of breath.  Objective: General: No acute distress, AAOx3  DP/PT pulses palpable 2/4, CRT < 3 sec to all digits.  LEFT foot: Wound evident still at the distal aspect incision.  Appears to be smaller today measuring 1 x 0.5 cm. It is at 0.5 cm at the widest but is 0.2 at the narrowest. There is no surrounding erythema, ascending cellulitis there is no fluctuation crepitation but there is no malodor. No pain with calf compression, swelling, warmth, erythema.   Assessment and Plan:  Left foot ulceration  -Treatment options discussed including all alternatives, risks, and complications -Sharply debrided the wound today utilizing #312 blade scalpel without any complications on the healthy, bleeding, viable tissue.  Continue Silvadene dressing changes daily.  Continue with Darco wedge shoe, offloading, elevation.  -Encourage elevation and limit activity.  Should only be working part-time. -Monitor for any clinical signs or symptoms of infection and directed to call the office immediately should any occur or go to the ER.  Trula Slade DPM

## 2019-10-27 NOTE — Progress Notes (Signed)
Cardiology Office Note   Date:  10/28/2019   ID:  [name], DOB 12-10-1955, MRN [id number]  PCP:  [name], MD  Cardiologist:   Minus Breeding, MD   Chief Complaint  Patient presents with  . Leg Swelling      History of Present Illness: [name] is a 64 y.o. male who presents for ongoing assessment and management of cardiomyopathy.  He has chronic shortness of breath. He has a past history of respiratory failure related to ARDS/influenza A with a reduced EF of 25% which was new, and new onset atrial fibrillation.  The EF returned to 45 - 50% on echo in Nov.  He has OSA followed by Dr. Lamonte Sakai.   At the last visit I increased his Cozaar.    Since I last saw him he has had some increased weight and looks like about 20 pounds.  He has had increased lower extremity swelling.  He will occasionally take an extra Lasix.  It sounds like he drinks a little too much fluid by our discussion but that he is trying to avoid salt.  He has some chronic dyspnea with some slight increase in this but he is not describing new PND or orthopnea.  He wears CPAP at night.  He is not having any new cough fevers or chills.  He is not having any new palpitations, presyncope or syncope.   Past Medical History:  Diagnosis Date  . Acute systolic HF (heart failure) ([place])   . Arthritis    lt foot, hips knees   . Atrial fibrillation ([place])   . Diabetes mellitus, new onset ([place])   . Diverticulosis   . Dyslipidemia   . Gout attack 01/16/2012  . Hypertension   . Internal hemorrhoids   . Lymphoma ([place])   . NHL dx'd 01/2010  . Pneumonia due to COVID-19 virus 07/2018  . Pulmonary hypertension ([place])   . Sepsis ([place]) 02/2017   secondary to influenza; requiring trach  . Tubular adenoma of colon   . Venous stasis ulcers ([facility])     Past Surgical History:  Procedure Laterality Date  . AMPUTATION TOE Left 05/08/2019   Procedure: AMPUTATION LEFT GREAT  TOE;  Surgeon: Trula Slade, DPM;  Location: [facility]  [facility];  Service: Podiatry;  Laterality: Left;  . BRONCHOSCOPY    . ESOPHAGOGASTRODUODENOSCOPY ENDOSCOPY     Dr Jearld Fenton  . INGUINAL HERNIA REPAIR Left    2001  . PORTA CATH INSERTION  2011  . TRACHEOSTOMY  03/2017   with decannulation     Current Outpatient Medications  Medication Sig Dispense Refill  . acetaminophen (TYLENOL) 325 MG tablet Take 650 mg by mouth every 6 (six) hours as needed for moderate pain.     [name] Kitchen albuterol (PROVENTIL HFA;VENTOLIN HFA) 108 (90 Base) MCG/ACT inhaler Inhale 2 puffs into the lungs every 6 (six) hours as needed for wheezing or shortness of breath. 1 Inhaler 2  . allopurinol (ZYLOPRIM) 300 MG tablet Take 300 mg by mouth daily.    [name] Kitchen atorvastatin (LIPITOR) 20 MG tablet TAKE 1 TABLET BY MOUTH EVERY DAY *PT OVERDUE FOR OFFICE VISIT* 30 tablet 5  . becaplermin (REGRANEX) 0.01 % gel Apply 1 application topically daily. Measurements right 1st toe 1.6 x 0.6 x 0.1cm 15 g 5  . carvedilol (COREG) 12.5 MG tablet Take 12.5 mg by mouth 2 (two) times daily.    . collagenase (SANTYL) ointment Apply 1 application topically daily. Measurements to right hallux 2  X 1 x 0.1cm 30 g 5  . [employer] DIGESTIVE PROBIOTIC 250 MG capsule Take 500 mg by mouth daily.    [name] Kitchen ELIQUIS 5 MG TABS tablet TAKE 1 TABLET BY MOUTH TWICE A DAY 180 tablet 2  . furosemide (LASIX) 20 MG tablet Take 2 tablets (40 mg total) by mouth 2 (two) times daily. 180 tablet 3  . KLOR-CON M20 20 MEQ tablet Take 2 tablets (40 mEq total) by mouth 2 (two) times daily. 360 tablet 3  . losartan (COZAAR) 50 MG tablet Take 1 tablet (50 mg total) by mouth in the morning and at bedtime. 180 tablet 1  . Multiple Vitamin (MULTIVITAMIN WITH MINERALS) TABS tablet Take 1 tablet by mouth daily.    . mupirocin ointment (BACTROBAN) 2 % Apply 1 application topically 2 (two) times daily. 30 g 2  . olmesartan-hydrochlorothiazide (BENICAR HCT) 40-25 MG tablet Take 1 tablet by mouth daily.    [name] Kitchen omeprazole (PRILOSEC) 40 MG capsule Take 40 mg by  mouth daily.     . sertraline (ZOLOFT) 50 MG tablet Take 1 tablet (50 mg total) by mouth daily. 30 tablet 0  . silver sulfADIAZINE (SILVADENE) 1 % cream Apply 1 application topically daily. 50 g 2  . sodium chloride (OCEAN) 0.65 % SOLN nasal spray Place 2 sprays into both nostrils as needed for congestion.  0  . vitamin C (VITAMIN C) 500 MG tablet Take 1 tablet (500 mg total) by mouth 3 (three) times daily.     No current facility-administered medications for this visit.   Facility-Administered Medications Ordered in Other Visits  Medication Dose Route Frequency Provider Last Rate Last Admin  . sodium chloride 0.9 % injection 10 mL  10 mL Intravenous PRN Curt Bears, MD   10 mL at 05/04/15 1529    Allergies:   Latex and Penicillins    ROS:  Please see the history of present illness.   Otherwise, review of systems are positive for none.   All other systems are reviewed and negative.    PHYSICAL EXAM: VS:  BP 126/84   Pulse 77   Ht 5\' 11"  (1.803 m)   Wt (!) 371 lb 3.2 oz (168.4 kg)   SpO2 98%   BMI 51.77 kg/m  , BMI Body mass index is 51.77 kg/m. GENERAL:  Well appearing NECK:  No jugular venous distention, waveform within normal limits, carotid upstroke brisk and symmetric, no bruits, no thyromegaly LUNGS:  Clear to auscultation bilaterally CHEST:  Unremarkable HEART:  PMI not displaced or sustained,S1 and S2 within normal limits, no S3, no S4, no clicks, no rubs, no murmurs, irregular ABD:  Flat, positive bowel sounds normal in frequency in pitch, no bruits, no rebound, no guarding, no midline pulsatile mass, no hepatomegaly, no splenomegaly EXT:  2 plus pulses throughout, severe bilateral leg edema, no cyanosis no clubbing   EKG:  EKG is ordered today Atrial fibrillation, rate 77, right bundle branch block.  No change from previous  Recent Labs: 04/30/2019: ALT 16; BUN 16; Creatinine, Ser 0.99; Hemoglobin 13.0; Platelets 187; Potassium 5.0; Sodium 138    Lipid Panel     Component Value Date/Time   TRIG 122 02/26/2017 1306      Wt Readings from Last 3 Encounters:  10/28/19 (!) 371 lb 3.2 oz (168.4 kg)  07/22/19 (!) 363 lb 6.4 oz (164.8 kg)  05/13/19 (!) 356 lb (161.5 kg)      Other studies Reviewed: Additional studies/ records that were reviewed today include:  Labs. Review of the above records demonstrates: See elsewhere  ASSESSMENT AND PLAN:  CARDIOMYOPATHY:     Today I am going to increase his diuretic to 40 mg twice daily.  We talked about better salt and fluid restriction.  We talked about trying to keep his legs elevated.  He can come back in 2 weeks for a basic metabolic profile and follow-up with an APP to try to keep off on his fluid.  He might need to be switched to [facility].   ATRIAL FIB:    He has had good rate control.  No change in therapy.  DYSPNEA:   This is multifactorial but I do think he has volume overload.  This will be addressed as above.   Current medicines are reviewed at length with the patient today.  The patient does not have concerns regarding medicines.  The following changes have been made: As above  Labs/ tests ordered today include:  None  Orders Placed This Encounter  Procedures  . EKG 12-Lead     Disposition:   FU with APP in two weeks.     Signed, Minus Breeding, MD  10/28/2019 1:24 PM     [facility]

## 2019-10-28 ENCOUNTER — Other Ambulatory Visit: Payer: Self-pay

## 2019-10-28 ENCOUNTER — Encounter: Payer: Self-pay | Admitting: Cardiology

## 2019-10-28 ENCOUNTER — Ambulatory Visit: Payer: BC Managed Care – PPO | Admitting: Cardiology

## 2019-10-28 VITALS — BP 126/84 | HR 77 | Ht 71.0 in | Wt 371.2 lb

## 2019-10-28 DIAGNOSIS — R0602 Shortness of breath: Secondary | ICD-10-CM

## 2019-10-28 DIAGNOSIS — I480 Paroxysmal atrial fibrillation: Secondary | ICD-10-CM

## 2019-10-28 DIAGNOSIS — I420 Dilated cardiomyopathy: Secondary | ICD-10-CM | POA: Diagnosis not present

## 2019-10-28 MED ORDER — FUROSEMIDE 20 MG PO TABS: 40.0000 mg | ORAL_TABLET | Freq: Two times a day (BID) | ORAL | 3 refills | Status: DC

## 2019-10-28 NOTE — Patient Instructions (Signed)
Medication Instructions:  INCREASE furosemide (Lasix) to 40 mg two times daily  *If you need a refill on your cardiac medications before your next appointment, please call your pharmacy*  Follow-Up: At [facility], you and your health needs are our priority.  As part of our continuing mission to provide you with exceptional heart care, we have created designated Provider Care Teams.  These Care Teams include your primary Cardiologist (physician) and Advanced Practice Providers (APPs -  Physician Assistants and Nurse Practitioners) who all work together to provide you with the care you need, when you need it.  We recommend signing up for the patient portal called "MyChart".  Sign up information is provided on this After Visit Summary.  MyChart is used to connect with patients for Virtual Visits (Telemedicine).  Patients are able to view lab/test results, encounter notes, upcoming appointments, etc.  Non-urgent messages can be sent to your provider as well.   To learn more about what you can do with MyChart, go to [email or web address].    Your next appointment:   2 week(s)  The format for your next appointment:   In Person  Provider:   You will see one of the following Advanced Practice Providers on your designated Care Team:    Rosaria Ferries, PA-C  Jory Sims, DNP, ANP

## 2019-11-04 ENCOUNTER — Other Ambulatory Visit: Payer: Self-pay

## 2019-11-04 ENCOUNTER — Ambulatory Visit (INDEPENDENT_AMBULATORY_CARE_PROVIDER_SITE_OTHER): Payer: BC Managed Care – PPO | Admitting: Podiatry

## 2019-11-04 DIAGNOSIS — L97522 Non-pressure chronic ulcer of other part of left foot with fat layer exposed: Secondary | ICD-10-CM

## 2019-11-04 DIAGNOSIS — L97422 Non-pressure chronic ulcer of left heel and midfoot with fat layer exposed: Secondary | ICD-10-CM

## 2019-11-04 DIAGNOSIS — E08621 Diabetes mellitus due to underlying condition with foot ulcer: Secondary | ICD-10-CM

## 2019-11-08 NOTE — Progress Notes (Signed)
Subjective: [name] is a 64 y.o. is seen today for follow-up evaluation of a wound on the left foot.  He states the wound is doing better.  He has been using the Silvadene the wound is almost healed he reports.  Denies any drainage or pus pain swelling or redness.  No other concerns today.  Denies any fevers, chills, nausea, vomiting.  No calf pain, chest pain, shortness of breath.    Objective: General: No acute distress, AAOx3  DP/PT pulses palpable 2/4, CRT < 3 sec to all digits.  LEFT foot: Wound evident still at the distal aspect incision.  Appears to be smaller today measuring 0.8 x 0.2 cm.  It is more superficial as well with a granular wound base.  There is no surrounding erythema, ascending cellulitis but there is no drainage approximately fluctuance or crepitation.  There is no malodor.  No obvious signs of infection noted. No pain with calf compression, swelling, warmth, erythema.   Assessment and Plan:  Left foot ulceration-improving  -Treatment options discussed including all alternatives, risks, and complications -Sharply debrided the wound today utilizing #312 blade scalpel without any complications on the healthy, bleeding, viable tissue.  Continue Silvadene dressing changes daily.  He can wash the area soap and water daily.  Continue with Darco wedge shoe, offloading, elevation.  -Monitor for any clinical signs or symptoms of infection and directed to call the office immediately should any occur or go to the ER.  [name] DPM

## 2019-11-11 ENCOUNTER — Ambulatory Visit: Payer: BC Managed Care – PPO | Admitting: General Practice

## 2019-11-11 ENCOUNTER — Other Ambulatory Visit: Payer: Self-pay

## 2019-11-11 ENCOUNTER — Encounter: Payer: Self-pay | Admitting: General Practice

## 2019-11-11 VITALS — BP 131/85 | HR 69 | Ht 70.5 in | Wt 365.0 lb

## 2019-11-11 DIAGNOSIS — J449 Chronic obstructive pulmonary disease, unspecified: Secondary | ICD-10-CM

## 2019-11-11 DIAGNOSIS — R0602 Shortness of breath: Secondary | ICD-10-CM

## 2019-11-11 DIAGNOSIS — I420 Dilated cardiomyopathy: Secondary | ICD-10-CM | POA: Diagnosis not present

## 2019-11-11 DIAGNOSIS — I482 Chronic atrial fibrillation, unspecified: Secondary | ICD-10-CM | POA: Diagnosis not present

## 2019-11-11 DIAGNOSIS — Z79899 Other long term (current) drug therapy: Secondary | ICD-10-CM

## 2019-11-11 MED ORDER — TORSEMIDE 20 MG PO TABS
20.0000 mg | ORAL_TABLET | Freq: Every day | ORAL | 3 refills | Status: DC
Start: 2019-11-11 — End: 2020-02-04

## 2019-11-11 MED ORDER — KLOR-CON M20 20 MEQ PO TBCR: 20.0000 meq | EXTENDED_RELEASE_TABLET | Freq: Every day | ORAL | 3 refills | Status: DC

## 2019-11-11 NOTE — Patient Instructions (Addendum)
Medication Instructions:  STOP: LASIX (FUROSEMIDE) START: TORSEMIDE 20mg  (DAILY) DECREASE POTASSIUM TO 62meq (1tablet) daily  *If you need a refill on your cardiac medications before your next appointment, please call your pharmacy*  Lab Work: BMET- TODAY  BMET- In [place] If you have labs (blood work) drawn today and your tests are completely normal, you will receive your results only by: [name] Kitchen MyChart Message (if you have MyChart) OR . A paper copy in the mail If you have any lab test that is abnormal or we need to change your treatment, we will call you to review the results.  Testing/Procedures: None Ordered At This Time.   Follow-Up: At [facility], you and your health needs are our priority.  As part of our continuing mission to provide you with exceptional heart care, we have created designated Provider Care Teams.  These Care Teams include your primary Cardiologist (physician) and Advanced Practice Providers (APPs -  Physician Assistants and Nurse Practitioners) who all work together to provide you with the care you need, when you need it.  Your next appointment:   3 month(s)  The format for your next appointment:   In Person  Provider:   You may see Minus Breeding, MD or one of the following Advanced Practice Providers on your designated Care Team:    Rosaria Ferries, PA-C  Jory Sims, DNP, ANP  Coletta Memos, FNP-C   Other Instructions  PLEASE WEIGH YOUR SELF DAILY, WRITE THIS DOWN. CALL THE OFFICE FOR A WEIGHT GAIN of 3 POUNDS OVER NIGHT or 5 POUNDS IN 1 WEEK.   [place] Diet Sheet Given

## 2019-11-11 NOTE — Progress Notes (Signed)
Cardiology Clinic Note   Patient Name: [name] Date of Encounter: 11/11/2019  Primary Care Provider:  [name], MD Primary Cardiologist:  Minus Breeding, MD  Patient Profile    [name] 64 year old male presents the clinic today for an evaluation of his lower extremity swelling.  Past Medical History    Past Medical History:  Diagnosis Date   Acute systolic HF (heart failure) (HCC)    Arthritis    lt foot, hips knees    Atrial fibrillation ([place])    Diabetes mellitus, new onset ([place])    Diverticulosis    Dyslipidemia    Gout attack 01/16/2012   Hypertension    Internal hemorrhoids    Lymphoma ([place])    NHL dx'd 01/2010   Pneumonia due to COVID-19 virus 07/2018   Pulmonary hypertension ([place])    Sepsis ([place]) 02/2017   secondary to influenza; requiring trach   Tubular adenoma of colon    Venous stasis ulcers ([place])    Past Surgical History:  Procedure Laterality Date   AMPUTATION TOE Left 05/08/2019   Procedure: AMPUTATION LEFT GREAT  TOE;  Surgeon: Trula Slade, DPM;  Location: [facility];  Service: Podiatry;  Laterality: Left;   BRONCHOSCOPY     ESOPHAGOGASTRODUODENOSCOPY ENDOSCOPY     Dr Royden Purl HERNIA REPAIR Left    2001   PORTA CATH INSERTION  2011   [place]  03/2017   with decannulation    Allergies  Allergies  Allergen Reactions   Latex Hives and Swelling   Penicillins Swelling    Age 6 or 61 - pt states he had swelling all over and his lips got real big    History of Present Illness    [name] is a PMH of HTN, chronic systolic CHF, atrial fibrillation, cardiomyopathy, hypotension, RBBB, OSA, community-acquired pneumonia, and COVID-19 infection.  He has chronic shortness of breath related to ARB/influenza A.  A prior echocardiogram showed an EF of 25%, repeat echocardiogram showed a improved EF of 45-50% in November 2020.  He is followed by pulmonary for his OSA.  His last seen by Dr. Percival Spanish  month 10/28/2019.  During that time he was noted to have increased lower extremity edema.  He had a notable weight increase of 20 pounds.  He reported occasionally taking an extra furosemide.  He reported consuming an excess amount of fluids but was trying to avoid sodium.  He was noted to have chronic dyspnea with a slight increase above his normal symptoms.  He denied PND orthopnea and was compliant with his CPAP at night.  He was not having any new fever, cough, or chills.  He denied palpitations presyncope and syncope.  He presents the clinic today for follow-up evaluation states he feels like his swelling is much improved.  He has not been able to wear his lower extremity support stockings in the past few weeks due to increased size of his lower extremities.  However, he does feel like there is less pressure.  His weight today is down from 371-365.  He continues to try to be somewhat physically active but is limited by his left great toe amputation.  He has been weighing himself every other day.  I educated on weighing every day and contacting the office with a weight increase of 3 pounds overnight or 5 pounds in 1 week.  He has been trying to stay away from increased sodium in his diet.  I will give him a  salty 6 sheet today switch him from furosemide to torsemide, check a BMP today and in 1 week and have him follow-up in 3 months.  Today he denies chest pain, shortness of breath, lower extremity edema, fatigue, palpitations, melena, hematuria, hemoptysis, diaphoresis, weakness, presyncope, syncope, orthopnea, and PND.   Home Medications    Prior to Admission medications   Medication Sig Start Date End Date Taking? Authorizing Provider  acetaminophen (TYLENOL) 325 MG tablet Take 650 mg by mouth every 6 (six) hours as needed for moderate pain.     [provider]  albuterol (PROVENTIL HFA;VENTOLIN HFA) 108 (90 Base) MCG/ACT inhaler Inhale 2 puffs into the lungs every 6 (six) hours as needed  for wheezing or shortness of breath. 04/05/17   Love, Ivan Anchors, PA-C  allopurinol (ZYLOPRIM) 300 MG tablet Take 300 mg by mouth daily. 12/03/18   [provider]  atorvastatin (LIPITOR) 20 MG tablet TAKE 1 TABLET BY MOUTH EVERY DAY *PT OVERDUE FOR OFFICE VISIT* 04/02/18   Minus Breeding, MD  becaplermin (REGRANEX) 0.01 % gel Apply 1 application topically daily. Measurements right 1st toe 1.6 x 0.6 x 0.1cm 10/10/18   Trula Slade, DPM  carvedilol (COREG) 12.5 MG tablet Take 12.5 mg by mouth 2 (two) times daily. 03/14/19   [provider]  collagenase (SANTYL) ointment Apply 1 application topically daily. Measurements to right hallux 2  X 1 x 0.1cm 02/18/19   Trula Slade, DPM  [employer] DIGESTIVE PROBIOTIC 250 MG capsule Take 500 mg by mouth daily. 08/03/18   [provider]  ELIQUIS 5 MG TABS tablet TAKE 1 TABLET BY MOUTH TWICE A DAY 09/23/19   Minus Breeding, MD  furosemide (LASIX) 20 MG tablet Take 2 tablets (40 mg total) by mouth 2 (two) times daily. 10/28/19   Minus Breeding, MD  KLOR-CON M20 20 MEQ tablet Take 2 tablets (40 mEq total) by mouth 2 (two) times daily. 11/20/18   Minus Breeding, MD  losartan (COZAAR) 50 MG tablet Take 1 tablet (50 mg total) by mouth in the morning and at bedtime. 07/22/19   Minus Breeding, MD  Multiple Vitamin (MULTIVITAMIN WITH MINERALS) TABS tablet Take 1 tablet by mouth daily. 04/04/17   Love, Ivan Anchors, PA-C  mupirocin ointment (BACTROBAN) 2 % Apply 1 application topically 2 (two) times daily. 07/30/18   Trula Slade, DPM  olmesartan-hydrochlorothiazide (BENICAR HCT) 40-25 MG tablet Take 1 tablet by mouth daily. 07/04/18   [provider]  omeprazole (PRILOSEC) 40 MG capsule Take 40 mg by mouth daily.  10/15/18   [provider]  sertraline (ZOLOFT) 50 MG tablet Take 1 tablet (50 mg total) by mouth daily. 04/05/17   Love, Ivan Anchors, PA-C  silver sulfADIAZINE (SILVADENE) 1 % cream Apply 1 application topically daily.  09/02/19   Trula Slade, DPM  sodium chloride (OCEAN) 0.65 % SOLN nasal spray Place 2 sprays into both nostrils as needed for congestion. 04/05/17   Love, Ivan Anchors, PA-C  vitamin C (VITAMIN C) 500 MG tablet Take 1 tablet (500 mg total) by mouth 3 (three) times daily. 04/05/17   Bary Leriche, PA-C    Family History    Family History  Problem Relation Age of Onset   Dementia Brother 55       frontal lobe    Dementia Mother    Arthritis Mother    Hypertension Mother    CVA Sister 39   Colon cancer Neg Hx    Pancreatic cancer Neg  Hx    Stomach cancer Neg Hx    Rectal cancer Neg Hx    Liver cancer Neg Hx    He indicated that his mother is deceased. He indicated that his father is deceased. He indicated that the status of his sister is unknown. He indicated that his brother is alive. He indicated that his maternal grandmother is deceased. He indicated that his maternal grandfather is deceased. He indicated that his paternal grandmother is deceased. He indicated that his paternal grandfather is deceased. He indicated that the status of his neg hx is unknown.  Social History    Social History   Socioeconomic History   Marital status: Divorced    Spouse name: Not on file   Number of children: Not on file   Years of education: Not on file   Highest education level: Not on file  Occupational History   Occupation: cosmetologist  Tobacco Use   Smoking status: Former Smoker    Packs/day: 0.50    Years: 20.00    Pack years: 10.00    Quit date: 01/10/2009    Years since quitting: 10.8   Smokeless tobacco: Never Used  Vaping Use   Vaping Use: Never used  Substance and Sexual Activity   Alcohol use: Yes    Comment: 2-3 times per week   Drug use: No   Sexual activity: Not on file  Other Topics Concern   Not on file  Social History Narrative   Not on file   Social Determinants of Health   Financial Resource Strain:    Difficulty of Paying Living  Expenses: Not on file  Food Insecurity:    Worried About Charity fundraiser in the Last Year: Not on file   [employer] of Food in the Last Year: Not on file  Transportation Needs:    Lack of Transportation (Medical): Not on file   Lack of Transportation (Non-Medical): Not on file  Physical Activity:    Days of Exercise per Week: Not on file   Minutes of Exercise per Session: Not on file  Stress:    Feeling of Stress : Not on file  Social Connections:    Frequency of Communication with Friends and Family: Not on file   Frequency of Social Gatherings with Friends and Family: Not on file   Attends Religious Services: Not on file   Active Member of Clubs or Organizations: Not on file   Attends Archivist Meetings: Not on file   Marital Status: Not on file  Intimate Partner Violence:    Fear of Current or Ex-Partner: Not on file   Emotionally Abused: Not on file   Physically Abused: Not on file   Sexually Abused: Not on file     Review of Systems    General:  No chills, fever, night sweats or weight changes.  Cardiovascular:  No chest pain, dyspnea on exertion, edema, orthopnea, palpitations, paroxysmal nocturnal dyspnea. Dermatological: No rash, lesions/masses Respiratory: No cough, dyspnea Urologic: No hematuria, dysuria Abdominal:   No nausea, vomiting, diarrhea, bright red blood per rectum, melena, or hematemesis Neurologic:  No visual changes, wkns, changes in mental status. All other systems reviewed and are otherwise negative except as noted above.  Physical Exam    VS:  BP 131/85    Pulse 69    Ht 5' 10.5" (1.791 m)    Wt (!) 365 lb (165.6 kg)    SpO2 94%    BMI 51.63 kg/m  , BMI  Body mass index is 51.63 kg/m. GEN: Well nourished, well developed, in no acute distress. HEENT: normal. Neck: Supple, no JVD, carotid bruits, or masses. Cardiac: RRR, no murmurs, rubs, or gallops. No clubbing, cyanosis, edema.  Radials/DP/PT 2+ and equal bilaterally.    Respiratory:  Respirations regular and unlabored, clear to auscultation bilaterally. GI: Soft, nontender, nondistended, BS + x 4. MS: no deformity or atrophy. Skin: warm and dry, no rash. Neuro:  Strength and sensation are intact. Psych: Normal affect.  Accessory Clinical Findings    Recent Labs: 04/30/2019: ALT 16; BUN 16; Creatinine, Ser 0.99; Hemoglobin 13.0; Platelets 187; Potassium 5.0; Sodium 138   Recent Lipid Panel    Component Value Date/Time   TRIG 122 02/26/2017 1306    ECG personally reviewed by me today-none today.  Echocardiogram 12/10/2018  IMPRESSIONS    1. Technically difficult study. Left ventricular ejection fraction, by  visual estimation, is grossly 45 to 50%. There appears to be mildly  decreased global systolic function. Images are not sufficient to assess  for regional wall motion abnormalities.  There is mildly increased left ventricular hypertrophy.  2. Left ventricular diastolic parameters are indeterminate.  3. Global right ventricle has mildly reduced systolic function.The right  ventricular size is mildly enlarged.  4. Left atrial size was mildly dilated.  5. Right atrial size was normal.  6. The mitral valve is normal in structure. Trace mitral valve  regurgitation.  7. The tricuspid valve is not well visualized. Tricuspid valve  regurgitation is trivial.  8. The aortic valve is tricuspid. Aortic valve regurgitation is not  visualized.  9. The pulmonic valve was not well visualized. Pulmonic valve  regurgitation is not visualized.  10. There is dilatation of the aortic root measuring 40 mm.  11. The inferior vena cava is dilated in size with <50% respiratory  variability, suggesting right atrial pressure of 15 mmHg.  12. The tricuspid regurgitant velocity is 2.79 m/s, and with an assumed  right atrial pressure of 15 mmHg, the estimated right ventricular systolic  pressure is moderately elevated at 46.1 mmHg.  Assessment & Plan    1.  Cardiomyopathy-no increased work of breathing today or activity intolerance.  Weight  down to 365 pounds.  Breathing improved after increasing furosemide. Stop furosemide Start torsemide 20 mg daily Continue potassium, carvedilol, olmesartan-HCTZ.   Heart healthy low-sodium diet-salty 6 given Increase physical activity as tolerated Fluid restriction 48 ounces daily Daily weights-contact office with weight gain of 3 pounds overnight or 5 pounds in a week Repeat BMP and in 1 week.  Atrial fibrillation-heart rate today 69 bpm. Continue carvedilol, Eliquis Heart healthy low-sodium diet-salty 6 given Increase physical activity as tolerated Avoid triggers caffeine, chocolate, EtOH etc.  Chronic dyspnea/shortness of breath -breathing  improved today, back to baseline.  Previously felt to be multifactorial in nature. Continue diuretic therapy, carvedilol, Heart healthy low-sodium diet-salty 6 given Increase physical activity as tolerated  Disposition: Follow-up with Dr. Percival Spanish or myself in 3 months.  Jossie Ng. Mercedes Fort NP-C    11/11/2019, 3:00 PM [address] [address] Office [phone] Fax [phone]  Notice: This dictation was prepared with Dragon dictation along with smaller phrase technology. Any transcriptional errors that result from this process are unintentional and may not be corrected upon review.

## 2019-11-12 LAB — BASIC METABOLIC PANEL
BUN/Creatinine Ratio: 19 (ref 10–24)
BUN: 16 mg/dL (ref 8–27)
CO2: 24 mmol/L (ref 20–29)
Calcium: 9.3 mg/dL (ref 8.6–10.2)
Chloride: 105 mmol/L (ref 96–106)
Creatinine, Ser: 0.86 mg/dL (ref 0.76–1.27)
GFR calc Af Amer: 106 mL/min/{1.73_m2} (ref >59)
GFR calc non Af Amer: 92 mL/min/{1.73_m2} (ref >59)
Glucose: 80 mg/dL (ref 65–99)
Potassium: 4.9 mmol/L (ref 3.5–5.2)
Sodium: 141 mmol/L (ref 134–144)

## 2019-11-25 ENCOUNTER — Ambulatory Visit (INDEPENDENT_AMBULATORY_CARE_PROVIDER_SITE_OTHER): Payer: BC Managed Care – PPO | Admitting: Podiatry

## 2019-11-25 ENCOUNTER — Other Ambulatory Visit: Payer: Self-pay

## 2019-11-25 DIAGNOSIS — E08621 Diabetes mellitus due to underlying condition with foot ulcer: Secondary | ICD-10-CM | POA: Diagnosis not present

## 2019-11-25 DIAGNOSIS — L97522 Non-pressure chronic ulcer of other part of left foot with fat layer exposed: Secondary | ICD-10-CM | POA: Diagnosis not present

## 2019-11-25 DIAGNOSIS — L97422 Non-pressure chronic ulcer of left heel and midfoot with fat layer exposed: Secondary | ICD-10-CM | POA: Diagnosis not present

## 2019-12-03 NOTE — Progress Notes (Signed)
Subjective: [name] is a 64 y.o. is seen today for follow-up evaluation of a wound on the left foot.  He feels that the wound is doing better and denies any drainage or pus.  Denies any swelling or redness.  Any drainage coming from the wound.  No pain. Denies any fevers, chills, nausea, vomiting.  No calf pain, chest pain, shortness of breath.    Objective: General: No acute distress, AAOx3  DP/PT pulses palpable 2/4, CRT < 3 sec to all digits.  LEFT foot: Wound evident still at the distal aspect incision.  Fibrotic tissue, scab present along the wound and upon debridement we measured 0.6 x 0.2 cm without any probing, undermining or tunneling.  There is no surrounding erythema, ascending cellulitis peer there is no fluctuance or crepitation.  There is no malodor. No pain with calf compression, swelling, warmth, erythema.   Assessment and Plan:  Left foot ulceration-improving  -Treatment options discussed including all alternatives, risks, and complications -Sharply debrided the wound today utilizing #312 blade scalpel without any complications on the healthy, bleeding, viable tissue patella promote wound healing.  Continue Silvadene dressing changes daily.  He can wash the area soap and water daily.  Continue with Darco shoe, offloading, elevation.  -Monitor for any clinical signs or symptoms of infection and directed to call the office immediately should any occur or go to the ER.  Trula Slade DPM

## 2019-12-09 DIAGNOSIS — H5213 Myopia, bilateral: Secondary | ICD-10-CM | POA: Diagnosis not present

## 2019-12-16 ENCOUNTER — Ambulatory Visit (INDEPENDENT_AMBULATORY_CARE_PROVIDER_SITE_OTHER): Payer: BC Managed Care – PPO | Admitting: Podiatry

## 2019-12-16 ENCOUNTER — Other Ambulatory Visit: Payer: Self-pay

## 2019-12-16 DIAGNOSIS — L97522 Non-pressure chronic ulcer of other part of left foot with fat layer exposed: Secondary | ICD-10-CM

## 2019-12-16 DIAGNOSIS — E08621 Diabetes mellitus due to underlying condition with foot ulcer: Secondary | ICD-10-CM

## 2019-12-16 DIAGNOSIS — L97422 Non-pressure chronic ulcer of left heel and midfoot with fat layer exposed: Secondary | ICD-10-CM

## 2019-12-20 NOTE — Progress Notes (Signed)
Subjective: [name] is a 64 y.o. is seen today for follow-up evaluation of a wound on the left foot.  He has been continuing with daily dressing changes with Silvadene.  He does wear regular shoe at home and when he is working he wears the Darco wedge shoe.  He states the wound is healing well.  He has no concerns otherwise today. Denies any fevers, chills, nausea, vomiting.  No calf pain, chest pain, shortness of breath.    Objective: General: No acute distress, AAOx3  DP/PT pulses palpable 2/4, CRT < 3 sec to all digits.  LEFT foot: Wound evident still at the distal aspect incision.  Fibroglandular wound base is present.  After debridement of the wound there is 0.5 x 0.2 cm.  There is superficial or any probing, undermining or tunneling.  No surrounding erythema, ascending cellulitis.  There is no fluctuation crepitation and there is no malodor. No pain with calf compression, swelling, warmth, erythema.   Assessment and Plan:  Left foot ulceration-improving  -Treatment options discussed including all alternatives, risks, and complications -Sharply debrided the wound today utilizing #312 blade scalpel without any complications on the healthy, bleeding, viable tissue patella promote wound healing.  Continue Silvadene dressing changes daily.  He can wash the area soap and water daily.  Continue with Darco shoe, offloading, elevation.  -Monitor for any clinical signs or symptoms of infection and directed to call the office immediately should any occur or go to the ER.  Trula Slade DPM

## 2020-01-01 ENCOUNTER — Other Ambulatory Visit: Payer: Self-pay | Admitting: Cardiology

## 2020-01-06 ENCOUNTER — Ambulatory Visit (INDEPENDENT_AMBULATORY_CARE_PROVIDER_SITE_OTHER): Payer: BC Managed Care – PPO | Admitting: Podiatry

## 2020-01-06 ENCOUNTER — Other Ambulatory Visit: Payer: Self-pay

## 2020-01-06 DIAGNOSIS — L97422 Non-pressure chronic ulcer of left heel and midfoot with fat layer exposed: Secondary | ICD-10-CM | POA: Diagnosis not present

## 2020-01-06 DIAGNOSIS — L97522 Non-pressure chronic ulcer of other part of left foot with fat layer exposed: Secondary | ICD-10-CM

## 2020-01-06 DIAGNOSIS — E08621 Diabetes mellitus due to underlying condition with foot ulcer: Secondary | ICD-10-CM | POA: Diagnosis not present

## 2020-01-07 ENCOUNTER — Encounter: Payer: Self-pay | Admitting: Internal Medicine

## 2020-01-07 ENCOUNTER — Other Ambulatory Visit: Payer: Self-pay

## 2020-01-07 ENCOUNTER — Inpatient Hospital Stay: Payer: BC Managed Care – PPO | Attending: Internal Medicine | Admitting: Internal Medicine

## 2020-01-07 ENCOUNTER — Inpatient Hospital Stay: Payer: BC Managed Care – PPO

## 2020-01-07 VITALS — BP 150/90 | HR 70 | Temp 98.3°F | Resp 18 | Ht 70.5 in | Wt 343.2 lb

## 2020-01-07 DIAGNOSIS — D649 Anemia, unspecified: Secondary | ICD-10-CM | POA: Diagnosis not present

## 2020-01-07 DIAGNOSIS — Z9221 Personal history of antineoplastic chemotherapy: Secondary | ICD-10-CM | POA: Diagnosis not present

## 2020-01-07 DIAGNOSIS — C8212 Follicular lymphoma grade II, intrathoracic lymph nodes: Secondary | ICD-10-CM

## 2020-01-07 DIAGNOSIS — Z89412 Acquired absence of left great toe: Secondary | ICD-10-CM | POA: Insufficient documentation

## 2020-01-07 DIAGNOSIS — C8210 Follicular lymphoma grade II, unspecified site: Secondary | ICD-10-CM | POA: Diagnosis not present

## 2020-01-07 DIAGNOSIS — Z8616 Personal history of COVID-19: Secondary | ICD-10-CM | POA: Diagnosis not present

## 2020-01-07 DIAGNOSIS — D696 Thrombocytopenia, unspecified: Secondary | ICD-10-CM | POA: Diagnosis not present

## 2020-01-07 LAB — CBC WITH DIFFERENTIAL (CANCER CENTER ONLY)
Abs Immature Granulocytes: 0.02 10*3/uL (ref 0.00–0.07)
Basophils Absolute: 0.1 10*3/uL (ref 0.0–0.1)
Basophils Relative: 1 %
Eosinophils Absolute: 0.1 10*3/uL (ref 0.0–0.5)
Eosinophils Relative: 2 %
HCT: 40.5 % (ref 39.0–52.0)
Hemoglobin: 12.7 g/dL — ABNORMAL LOW (ref 13.0–17.0)
Immature Granulocytes: 0 %
Lymphocytes Relative: 19 %
Lymphs Abs: 1.2 10*3/uL (ref 0.7–4.0)
MCH: 28.8 pg (ref 26.0–34.0)
MCHC: 31.4 g/dL (ref 30.0–36.0)
MCV: 91.8 fL (ref 80.0–100.0)
Monocytes Absolute: 0.6 10*3/uL (ref 0.1–1.0)
Monocytes Relative: 9 %
Neutro Abs: 4.3 10*3/uL (ref 1.7–7.7)
Neutrophils Relative %: 69 %
Platelet Count: 147 10*3/uL — ABNORMAL LOW (ref 150–400)
RBC: 4.41 MIL/uL (ref 4.22–5.81)
RDW: 17.6 % — ABNORMAL HIGH (ref 11.5–15.5)
WBC Count: 6.2 10*3/uL (ref 4.0–10.5)
nRBC: 0 % (ref 0.0–0.2)

## 2020-01-07 LAB — CMP (CANCER CENTER ONLY)
ALT: 17 U/L (ref 0–44)
AST: 22 U/L (ref 15–41)
Albumin: 3.6 g/dL (ref 3.5–5.0)
Alkaline Phosphatase: 169 U/L — ABNORMAL HIGH (ref 38–126)
Anion gap: 3 — ABNORMAL LOW (ref 5–15)
BUN: 17 mg/dL (ref 8–23)
CO2: 30 mmol/L (ref 22–32)
Calcium: 9.3 mg/dL (ref 8.9–10.3)
Chloride: 105 mmol/L (ref 98–111)
Creatinine: 0.84 mg/dL (ref 0.61–1.24)
GFR, Estimated: >60 mL/min (ref >60)
Glucose, Bld: 110 mg/dL — ABNORMAL HIGH (ref 70–99)
Potassium: 4.1 mmol/L (ref 3.5–5.1)
Sodium: 138 mmol/L (ref 135–145)
Total Bilirubin: 0.9 mg/dL (ref 0.3–1.2)
Total Protein: 7.2 g/dL (ref 6.5–8.1)

## 2020-01-07 LAB — LACTATE DEHYDROGENASE: LDH: 164 U/L (ref 98–192)

## 2020-01-07 NOTE — Progress Notes (Signed)
[name] [phone]   [phone]  OFFICE PROGRESS NOTE  [name], MD [place] [address]  DIAGNOSIS: : Stage III follicular lymphoma, grade 2, diagnosed in June 2012   PRIOR THERAPY:  1) Systemic chemotherapy with CHOP/Rituxan, status post 8 cycles, last dose was given 07/19/2010.  2)  Maintenance therapy with Rituxan 375 mg/m2 given every 2 months for a total of 2 years, status post 12 cycles.  CURRENT THERAPY: Observation.  INTERVAL HISTORY: [name] 64 y.o. male returns to the clinic today for annual follow-up visit.  The patient is feeling fine today with no concerning complaints.  He has amputation of the left big toe several months ago.  He denied having any current chest pain, shortness of breath, cough or hemoptysis.  He denied having any fever or chills.  He has no nausea, vomiting, diarrhea or constipation.  He has no headache or visual changes.  He has no weight loss or night sweats.  The patient is here today for evaluation and repeat blood work.  MEDICAL HISTORY: Past Medical History:  Diagnosis Date   Acute systolic HF (heart failure) (HCC)    Arthritis    lt foot, hips knees    Atrial fibrillation ([place])    Diabetes mellitus, new onset ([place])    Diverticulosis    Dyslipidemia    Gout attack 01/16/2012   Hypertension    Internal hemorrhoids    Lymphoma ([place])    NHL dx'd 01/2010   Pneumonia due to COVID-19 virus 07/2018   Pulmonary hypertension ([place])    Sepsis ([place]) 02/2017   secondary to influenza; requiring trach   Tubular adenoma of colon    Venous stasis ulcers ([place])     ALLERGIES:  is allergic to latex and penicillins.  MEDICATIONS:  Current Outpatient Medications  Medication Sig Dispense Refill   acetaminophen (TYLENOL) 325 MG tablet Take 650 mg by mouth every 6 (six) hours as needed for moderate pain.      albuterol (PROVENTIL HFA;VENTOLIN HFA) 108 (90 Base)  MCG/ACT inhaler Inhale 2 puffs into the lungs every 6 (six) hours as needed for wheezing or shortness of breath. 1 Inhaler 2   allopurinol (ZYLOPRIM) 300 MG tablet Take 300 mg by mouth daily.     atorvastatin (LIPITOR) 20 MG tablet TAKE 1 TABLET BY MOUTH EVERY DAY *PT OVERDUE FOR OFFICE VISIT* 30 tablet 5   becaplermin (REGRANEX) 0.01 % gel Apply 1 application topically daily. Measurements right 1st toe 1.6 x 0.6 x 0.1cm 15 g 5   carvedilol (COREG) 12.5 MG tablet Take 12.5 mg by mouth 2 (two) times daily.     collagenase (SANTYL) ointment Apply 1 application topically daily. Measurements to right hallux 2  X 1 x 0.1cm 30 g 5   [employer] DIGESTIVE PROBIOTIC 250 MG capsule Take 500 mg by mouth daily.     ELIQUIS 5 MG TABS tablet TAKE 1 TABLET BY MOUTH TWICE A DAY 180 tablet 2   KLOR-CON M20 20 MEQ tablet Take 1 tablet (20 mEq total) by mouth daily. 30 tablet 3   losartan (COZAAR) 50 MG tablet Take 1 tablet (50 mg total) by mouth in the morning and at bedtime. 180 tablet 1   Multiple Vitamin (MULTIVITAMIN WITH MINERALS) TABS tablet Take 1 tablet by mouth daily.     mupirocin ointment (BACTROBAN) 2 % Apply 1 application topically 2 (two) times daily. 30 g 2   olmesartan-hydrochlorothiazide (BENICAR  HCT) 40-25 MG tablet Take 1 tablet by mouth daily.     omeprazole (PRILOSEC) 40 MG capsule Take 40 mg by mouth daily.      sertraline (ZOLOFT) 50 MG tablet Take 1 tablet (50 mg total) by mouth daily. 30 tablet 0   silver sulfADIAZINE (SILVADENE) 1 % cream Apply 1 application topically daily. 50 g 2   sodium chloride (OCEAN) 0.65 % SOLN nasal spray Place 2 sprays into both nostrils as needed for congestion.  0   torsemide (DEMADEX) 20 MG tablet Take 1 tablet (20 mg total) by mouth daily. 30 tablet 3   vitamin C (VITAMIN C) 500 MG tablet Take 1 tablet (500 mg total) by mouth 3 (three) times daily.     No current facility-administered medications for this visit.   Facility-Administered  Medications Ordered in Other Visits  Medication Dose Route Frequency Provider Last Rate Last Admin   sodium chloride 0.9 % injection 10 mL  10 mL Intravenous PRN Si Gaul, MD   10 mL at 05/04/15 1529    REVIEW OF SYSTEMS:  A comprehensive review of systems was negative.   PHYSICAL EXAMINATION: General appearance: alert, cooperative and no distress Head: Normocephalic, without obvious abnormality, atraumatic Neck: no adenopathy Lymph nodes: Cervical, supraclavicular, and axillary nodes normal. Resp: clear to auscultation bilaterally Back: symmetric, no curvature. ROM normal. No CVA tenderness. Cardio: regular rate and rhythm, S1, S2 normal, no murmur, click, rub or gallop GI: soft, non-tender; bowel sounds normal; no masses,  no organomegaly Extremities: extremities normal, atraumatic, no cyanosis or edema  ECOG PERFORMANCE STATUS: 1 - Symptomatic but completely ambulatory  Blood pressure (!) 150/90, pulse 70, temperature 98.3 F (36.8 C), temperature source Tympanic, resp. rate 18, height 5' 10.5" (1.791 m), weight (!) 343 lb 3.2 oz (155.7 kg), SpO2 95 %.  LABORATORY DATA: Lab Results  Component Value Date   WBC 6.2 01/07/2020   HGB 12.7 (L) 01/07/2020   HCT 40.5 01/07/2020   MCV 91.8 01/07/2020   PLT 147 (L) 01/07/2020      Chemistry      Component Value Date/Time   NA 141 11/11/2019 1506   NA 137 10/24/2016 1440   K 4.9 11/11/2019 1506   K 3.6 10/24/2016 1440   CL 105 11/11/2019 1506   CL 105 05/14/2012 0840   CO2 24 11/11/2019 1506   CO2 26 10/24/2016 1440   BUN 16 11/11/2019 1506   BUN 16.2 10/24/2016 1440   CREATININE 0.86 11/11/2019 1506   CREATININE 0.87 01/07/2019 1458   CREATININE 0.8 10/24/2016 1440      Component Value Date/Time   CALCIUM 9.3 11/11/2019 1506   CALCIUM 9.2 10/24/2016 1440   ALKPHOS 150 (H) 04/30/2019 0813   ALKPHOS 131 10/24/2016 1440   AST 22 04/30/2019 0813   AST 16 01/07/2019 1458   AST 31 10/24/2016 1440   ALT 16  04/30/2019 0813   ALT 14 01/07/2019 1458   ALT 31 10/24/2016 1440   BILITOT 0.8 04/30/2019 0813   BILITOT 0.8 01/07/2019 1458   BILITOT 0.61 10/24/2016 1440      RADIOLOGY STUDIES: No results found. ASSESSMENT AND PLAN:  This is a very pleasant 64 years old white male with a stage IIIa follicular lymphoma status post treatment with CHOP/Rituxan followed by maintenance Rituxan for 2 years and has been observation for more than 8 years now. The patient is doing fine today with no concerning complaints. Repeat CBC today showed mild anemia and thrombocytopenia.  Comprehensive  metabolic panel and LDH are still pending. I recommended for the patient to continue on observation with repeat blood work in 1 year. I will refer the patient to IR for Port-A-Cath removal. The patient was advised to call immediately if he has any concerning symptoms in the interval. All questions were answered. The patient knows to call the clinic with any problems, questions or concerns. We can certainly see the patient much sooner if necessary.  Disclaimer: This note was dictated with voice recognition software. Similar sounding words can inadvertently be transcribed and may not be corrected upon review.

## 2020-01-09 ENCOUNTER — Telehealth: Payer: Self-pay | Admitting: Internal Medicine

## 2020-01-09 NOTE — Progress Notes (Signed)
Subjective: [name] is a 64 y.o. is seen today for follow-up evaluation of a wound on the left foot.  He feels the wound is doing better.  Denies any drainage or pus or any swelling or redness.  He has no concerns today. Denies any fevers, chills, nausea, vomiting.  No calf pain, chest pain, shortness of breath.    Objective: General: No acute distress, AAOx3  DP/PT pulses palpable 2/4, CRT < 3 sec to all digits.  LEFT foot: At the distal aspect with a wound is present with hyperkeratotic tissue.  Upon debridement there is no wound is almost completely healed however it is preulcerative.  There is no drainage or pus.  No surrounding erythema, ascending cellulitis there is no fluctuation or location.  There is no malodor. No pain with calf compression, swelling, warmth, erythema.   Assessment and Plan:  Left foot ulceration-improving  -Treatment options discussed including all alternatives, risks, and complications -Sharply debrided the hyperkeratotic tissue today to reveal underlying wound is almost completely healed but still preulcerative.  I would continue daily dressing changes.  He can slowly transition back into regular shoe as tolerated discussed the importance of daily foot inspection of there is any worsening of the at the wound to return to the offloading shoe and let me know.   -Monitor for any clinical signs or symptoms of infection and directed to call the office immediately should any occur or go to the ER.  *Schedule to see Raiford Noble for insert with toe filler  Vivi Barrack DPM

## 2020-01-09 NOTE — Telephone Encounter (Signed)
Scheduled per 12/28 los. Called and spoke with pt, confirmed 12/28 appts

## 2020-01-15 ENCOUNTER — Other Ambulatory Visit: Payer: Self-pay | Admitting: Cardiology

## 2020-01-20 ENCOUNTER — Ambulatory Visit: Payer: BC Managed Care – PPO | Admitting: Orthotics

## 2020-01-20 ENCOUNTER — Ambulatory Visit: Payer: BC Managed Care – PPO | Admitting: Podiatry

## 2020-01-20 ENCOUNTER — Other Ambulatory Visit: Payer: Self-pay

## 2020-01-20 DIAGNOSIS — L97522 Non-pressure chronic ulcer of other part of left foot with fat layer exposed: Secondary | ICD-10-CM | POA: Diagnosis not present

## 2020-01-20 DIAGNOSIS — L97422 Non-pressure chronic ulcer of left heel and midfoot with fat layer exposed: Secondary | ICD-10-CM

## 2020-01-20 DIAGNOSIS — E08621 Diabetes mellitus due to underlying condition with foot ulcer: Secondary | ICD-10-CM

## 2020-01-20 DIAGNOSIS — L97529 Non-pressure chronic ulcer of other part of left foot with unspecified severity: Secondary | ICD-10-CM

## 2020-01-20 NOTE — Progress Notes (Signed)
Cast today for toefiller (L), accommodative f/o (R) to add protection to conditions secondary to DM2

## 2020-01-23 NOTE — Progress Notes (Signed)
Subjective: [name] is a 65 y.o. is seen today for follow-up evaluation of a wound on the left foot.  He states that it is well with the Silvadene for the last couple days he has noticed a scab forming.  Denies any drainage or pus or any swelling or redness.  He denies any fevers, chills, nausea, vomiting.  No calf pain, chest pain, shortness of breath.  No other concerns today.   Hopefully can get measured for insert with toe filler today.   Objective: General: No acute distress, AAOx3  DP/PT pulses palpable 2/4, CRT < 3 sec to all digits.  LEFT foot: At the distal aspect with a wound is present with hyperkeratotic tissue.  After debridement superficial granular wound is present.  After debridement the wound is 1% granular and superficial without any probing, undermining or tunneling but appears about the same size today. No pain with calf compression, swelling, warmth, erythema.   Assessment and Plan:  Left foot ulceration  -Treatment options discussed including all alternatives, risks, and complications -Sharply debrided the hyperkeratotic tissue today to reveal underlying wound is still present and granular.  Discussed patient to Small on antibiotic ointment or Vaseline daily.  Appears to the area we will try alcohol for the callus to form which is inhibiting the healing.  -Monitor for any clinical signs or symptoms of infection and directed to call the office immediately should any occur or go to the ER. -Measured for insert/toe filler today with our pedorthotist.   Return in about 3 weeks (around 02/10/2020).  Trula Slade DPM

## 2020-01-31 ENCOUNTER — Other Ambulatory Visit: Payer: Self-pay | Admitting: Radiology

## 2020-02-03 ENCOUNTER — Other Ambulatory Visit: Payer: Self-pay

## 2020-02-03 ENCOUNTER — Ambulatory Visit (HOSPITAL_COMMUNITY)
Admission: RE | Admit: 2020-02-03 | Discharge: 2020-02-03 | Disposition: A | Discharge status: Home / Self Care | Payer: BC Managed Care – PPO | Source: Ambulatory Visit | Attending: Internal Medicine | Admitting: Internal Medicine

## 2020-02-03 ENCOUNTER — Other Ambulatory Visit: Payer: Self-pay | Admitting: General Practice

## 2020-02-03 ENCOUNTER — Ambulatory Visit (HOSPITAL_COMMUNITY)
Admission: RE | Admit: 2020-02-03 | Discharge: 2020-02-03 | Disposition: A | Discharge status: Home / Self Care | Payer: BC Managed Care – PPO | Source: Ambulatory Visit

## 2020-02-03 DIAGNOSIS — Z8572 Personal history of non-Hodgkin lymphomas: Secondary | ICD-10-CM | POA: Diagnosis not present

## 2020-02-03 DIAGNOSIS — Z91040 Latex allergy status: Secondary | ICD-10-CM | POA: Diagnosis not present

## 2020-02-03 DIAGNOSIS — Z7901 Long term (current) use of anticoagulants: Secondary | ICD-10-CM | POA: Diagnosis not present

## 2020-02-03 DIAGNOSIS — Z79899 Other long term (current) drug therapy: Secondary | ICD-10-CM | POA: Insufficient documentation

## 2020-02-03 DIAGNOSIS — Z452 Encounter for adjustment and management of vascular access device: Secondary | ICD-10-CM | POA: Diagnosis not present

## 2020-02-03 DIAGNOSIS — Z880 Allergy status to penicillin: Secondary | ICD-10-CM | POA: Diagnosis not present

## 2020-02-03 DIAGNOSIS — C8212 Follicular lymphoma grade II, intrathoracic lymph nodes: Secondary | ICD-10-CM

## 2020-02-03 HISTORY — PX: IR REMOVAL TUN ACCESS W/ PORT W/O FL MOD SED: IMG2290

## 2020-02-03 LAB — CBC WITH DIFFERENTIAL/PLATELET
Abs Immature Granulocytes: 0.03 10*3/uL (ref 0.00–0.07)
Basophils Absolute: 0.1 10*3/uL (ref 0.0–0.1)
Basophils Relative: 1 %
Eosinophils Absolute: 0.2 10*3/uL (ref 0.0–0.5)
Eosinophils Relative: 2 %
HCT: 43.8 % (ref 39.0–52.0)
Hemoglobin: 14 g/dL (ref 13.0–17.0)
Immature Granulocytes: 0 %
Lymphocytes Relative: 20 %
Lymphs Abs: 1.5 10*3/uL (ref 0.7–4.0)
MCH: 30.4 pg (ref 26.0–34.0)
MCHC: 32 g/dL (ref 30.0–36.0)
MCV: 95 fL (ref 80.0–100.0)
Monocytes Absolute: 0.8 10*3/uL (ref 0.1–1.0)
Monocytes Relative: 10 %
Neutro Abs: 5 10*3/uL (ref 1.7–7.7)
Neutrophils Relative %: 67 %
Platelets: 162 10*3/uL (ref 150–400)
RBC: 4.61 MIL/uL (ref 4.22–5.81)
RDW: 18.1 % — ABNORMAL HIGH (ref 11.5–15.5)
WBC: 7.5 10*3/uL (ref 4.0–10.5)
nRBC: 0 % (ref 0.0–0.2)

## 2020-02-03 LAB — PROTIME-INR
INR: 1.2 (ref 0.8–1.2)
Prothrombin Time: 14.8 seconds (ref 11.4–15.2)

## 2020-02-03 MED ORDER — LIDOCAINE-EPINEPHRINE 1 %-1:100000 IJ SOLN
INTRAMUSCULAR | Status: AC
  Filled 2020-02-03: qty 1

## 2020-02-03 MED ORDER — MIDAZOLAM HCL 2 MG/2ML IJ SOLN
INTRAMUSCULAR | Status: AC | PRN
  Administered 2020-02-03 (×2): 1 mg via INTRAVENOUS

## 2020-02-03 MED ORDER — SODIUM CHLORIDE 0.9 % IV SOLN: INTRAVENOUS | Status: DC

## 2020-02-03 MED ORDER — FENTANYL CITRATE (PF) 100 MCG/2ML IJ SOLN
INTRAMUSCULAR | Status: AC
  Filled 2020-02-03: qty 2

## 2020-02-03 MED ORDER — MIDAZOLAM HCL 2 MG/2ML IJ SOLN
INTRAMUSCULAR | Status: AC
  Filled 2020-02-03: qty 2

## 2020-02-03 MED ORDER — CLINDAMYCIN PHOSPHATE 900 MG/50ML IV SOLN: 900.0000 mg | Freq: Once | INTRAVENOUS | Status: AC

## 2020-02-03 MED ORDER — FENTANYL CITRATE (PF) 100 MCG/2ML IJ SOLN
INTRAMUSCULAR | Status: AC | PRN
  Administered 2020-02-03 (×2): 50 ug via INTRAVENOUS

## 2020-02-03 MED ORDER — CLINDAMYCIN PHOSPHATE 900 MG/50ML IV SOLN
INTRAVENOUS | Status: AC
  Administered 2020-02-03: 900 mg via INTRAVENOUS
  Filled 2020-02-03: qty 50

## 2020-02-03 MED ORDER — LIDOCAINE-EPINEPHRINE 1 %-1:100000 IJ SOLN
INTRAMUSCULAR | Status: AC | PRN
  Administered 2020-02-03: 10 mL via INTRADERMAL

## 2020-02-03 NOTE — Discharge Instructions (Signed)
Please call Interventional Radiology clinic [phone] with any questions or concerns.  You may remove your dressing and shower tomorrow.   Implanted Port Removal, Care After This sheet gives you information about how to care for yourself after your procedure. Your health care provider may also give you more specific instructions. If you have problems or questions, contact your health care provider. What can I expect after the procedure? After the procedure, it is common to have: Soreness or pain near your incision. Some swelling or bruising near your incision. Follow these instructions at home: Medicines Take over-the-counter and prescription medicines only as told by your health care provider. If you were prescribed an antibiotic medicine, take it as told by your health care provider. Do not stop taking the antibiotic even if you start to feel better. Bathing Do not take baths, swim, or use a hot tub until your health care provider approves. Ask your health care provider if you can take showers. You may only be allowed to take sponge baths. Incision care Follow instructions from your health care provider about how to take care of your incision. Make sure you: Wash your hands with soap and water before you change your bandage (dressing). If soap and water are not available, use hand sanitizer. Change your dressing as told by your health care provider. Keep your dressing dry. Leave stitches (sutures), skin glue, or adhesive strips in place. These skin closures may need to stay in place for 2 weeks or longer. If adhesive strip edges start to loosen and curl up, you may trim the loose edges. Do not remove adhesive strips completely unless your health care provider tells you to do that. Check your incision area every day for signs of infection. Check for: More redness, swelling, or pain. More fluid or blood. Warmth. Pus or a bad smell.    Driving Do not drive for 24 hours if you were  given a medicine to help you relax (sedative) during your procedure. If you did not receive a sedative, ask your health care provider when it is safe to drive.    Activity Return to your normal activities as told by your health care provider. Ask your health care provider what activities are safe for you. Do not lift anything that is heavier than 10 lb (4.5 kg), or the limit that you are told, until your health care provider says that it is safe. Do not do activities that involve lifting your arms over your head. General instructions Do not use any products that contain nicotine or tobacco, such as cigarettes and e-cigarettes. These can delay healing. If you need help quitting, ask your health care provider. Keep all follow-up visits as told by your health care provider. This is important. Contact a health care provider if: You have more redness, swelling, or pain around your incision. You have more fluid or blood coming from your incision. Your incision feels warm to the touch. You have pus or a bad smell coming from your incision. You have pain that is not relieved by your pain medicine. Get help right away if you have: A fever or chills. Chest pain. Difficulty breathing. Summary After the procedure, it is common to have pain, soreness, swelling, or bruising near your incision. If you were prescribed an antibiotic medicine, take it as told by your health care provider. Do not stop taking the antibiotic even if you start to feel better. Do not drive for 24 hours if you were given a sedative during   your procedure. Return to your normal activities as told by your health care provider. Ask your health care provider what activities are safe for you. This information is not intended to replace advice given to you by your health care provider. Make sure you discuss any questions you have with your health care provider. Document Revised: 02/09/2017 Document Reviewed: 02/09/2017 Elsevier Patient  Education  2021 Elsevier Inc.   Moderate Conscious Sedation, Adult, Care After This sheet gives you information about how to care for yourself after your procedure. Your health care provider may also give you more specific instructions. If you have problems or questions, contact your health care provider. What can I expect after the procedure? After the procedure, it is common to have: Sleepiness for several hours. Impaired judgment for several hours. Difficulty with balance. Vomiting if you eat too soon. Follow these instructions at home: For the time period you were told by your health care provider: Rest. Do not participate in activities where you could fall or become injured. Do not drive or use machinery. Do not drink alcohol. Do not take sleeping pills or medicines that cause drowsiness. Do not make important decisions or sign legal documents. Do not take care of children on your own.        Eating and drinking Follow the diet recommended by your health care provider. Drink enough fluid to keep your urine pale yellow. If you vomit: Drink water, juice, or soup when you can drink without vomiting. Make sure you have little or no nausea before eating solid foods.    General instructions Take over-the-counter and prescription medicines only as told by your health care provider. Have a responsible adult stay with you for the time you are told. It is important to have someone help care for you until you are awake and alert. Do not smoke. Keep all follow-up visits as told by your health care provider. This is important. Contact a health care provider if: You are still sleepy or having trouble with balance after 24 hours. You feel light-headed. You keep feeling nauseous or you keep vomiting. You develop a rash. You have a fever. You have redness or swelling around the IV site. Get help right away if: You have trouble breathing. You have new-onset confusion at  home. Summary After the procedure, it is common to feel sleepy, have impaired judgment, or feel nauseous if you eat too soon. Rest after you get home. Know the things you should not do after the procedure. Follow the diet recommended by your health care provider and drink enough fluid to keep your urine pale yellow. Get help right away if you have trouble breathing or new-onset confusion at home. This information is not intended to replace advice given to you by your health care provider. Make sure you discuss any questions you have with your health care provider. Document Revised: 04/26/2019 Document Reviewed: 11/22/2018 Elsevier Patient Education  2021 Elsevier Inc.   

## 2020-02-03 NOTE — H&P (Signed)
Referring Physician(s): Mohamed,Mohamed  Supervising Physician: Jacqulynn Cadet  Patient Status:  [facility] OP  Chief Complaint: "I'm getting my port out"   Subjective: Patient familiar to IR service from bone marrow biopsy in 2012.  He has a history of follicular lymphoma diagnosed in 2012, status post chemotherapy has been on observation over the past 8 years.  He presents today for Port-A-Cath removal (placed in 2011).  He currently denies fever, headache, chest pain, dyspnea, cough, abdominal/back pain, nausea, vomiting or bleeding.  Past Medical History:  Diagnosis Date  . Acute systolic HF (heart failure) ([place])   . Arthritis    lt foot, hips knees   . Atrial fibrillation ([place])   . Diabetes mellitus, new onset ([place])   . Diverticulosis   . Dyslipidemia   . Gout attack 01/16/2012  . Hypertension   . Internal hemorrhoids   . Lymphoma ([place])   . NHL dx'd 01/2010  . Pneumonia due to COVID-19 virus 07/2018  . Pulmonary hypertension ([place])   . Sepsis ([place]) 02/2017   secondary to influenza; requiring trach  . Tubular adenoma of colon   . Venous stasis ulcers ([facility])    Past Surgical History:  Procedure Laterality Date  . AMPUTATION TOE Left 05/08/2019   Procedure: AMPUTATION LEFT GREAT  TOE;  Surgeon: Trula Slade, DPM;  Location: [facility];  Service: Podiatry;  Laterality: Left;  . BRONCHOSCOPY    . ESOPHAGOGASTRODUODENOSCOPY ENDOSCOPY     Dr Jearld Fenton  . INGUINAL HERNIA REPAIR Left    2001  . PORTA CATH INSERTION  2011  . TRACHEOSTOMY  03/2017   with decannulation      Allergies: Latex and Penicillins  Medications: Prior to Admission medications   Medication Sig Start Date End Date Taking? Authorizing Provider  allopurinol (ZYLOPRIM) 300 MG tablet Take 300 mg by mouth daily. 12/03/18  Yes [provider]  atorvastatin (LIPITOR) 20 MG tablet TAKE 1 TABLET BY MOUTH EVERY DAY *PT OVERDUE FOR OFFICE VISIT* 04/02/18  Yes Minus Breeding, MD  carvedilol (COREG)  12.5 MG tablet Take 12.5 mg by mouth 2 (two) times daily. 03/14/19  Yes [provider]  collagenase (SANTYL) ointment Apply 1 application topically daily. Measurements to right hallux 2  X 1 x 0.1cm 02/18/19  Yes Trula Slade, DPM  [employer] DIGESTIVE PROBIOTIC 250 MG capsule Take 500 mg by mouth daily. 08/03/18  Yes [provider]  ELIQUIS 5 MG TABS tablet TAKE 1 TABLET BY MOUTH TWICE A DAY 09/23/19  Yes Hochrein, Jeneen Rinks, MD  KLOR-CON M20 20 MEQ tablet Take 1 tablet (20 mEq total) by mouth daily. 11/11/19  Yes Cleaver, Jossie Ng, NP  losartan (COZAAR) 50 MG tablet TAKE 1 TABLET BY MOUTH EVERY MORNING AND AT BEDTIME 01/15/20  Yes Hochrein, Jeneen Rinks, MD  Multiple Vitamin (MULTIVITAMIN WITH MINERALS) TABS tablet Take 1 tablet by mouth daily. 04/04/17  Yes Love, Ivan Anchors, PA-C  mupirocin ointment (BACTROBAN) 2 % Apply 1 application topically 2 (two) times daily. 07/30/18  Yes Trula Slade, DPM  olmesartan-hydrochlorothiazide (BENICAR HCT) 40-25 MG tablet Take 1 tablet by mouth daily. 07/04/18  Yes [provider]  omeprazole (PRILOSEC) 40 MG capsule Take 40 mg by mouth daily.  10/15/18  Yes [provider]  sertraline (ZOLOFT) 50 MG tablet Take 1 tablet (50 mg total) by mouth daily. 04/05/17  Yes Love, Ivan Anchors, PA-C  silver sulfADIAZINE (SILVADENE) 1 % cream Apply 1 application topically daily. 09/02/19  Yes Trula Slade, DPM  sodium chloride (OCEAN) 0.65 % SOLN nasal spray Place 2 sprays into both nostrils as needed for congestion. 04/05/17  Yes Love, Ivan Anchors, PA-C  torsemide (DEMADEX) 20 MG tablet Take 1 tablet (20 mg total) by mouth daily. 11/11/19  Yes Cleaver, Jossie Ng, NP  vitamin C (VITAMIN C) 500 MG tablet Take 1 tablet (500 mg total) by mouth 3 (three) times daily. 04/05/17  Yes Love, Ivan Anchors, PA-C  acetaminophen (TYLENOL) 325 MG tablet Take 650 mg by mouth every 6 (six) hours as needed for moderate pain.     [provider]  albuterol (PROVENTIL  HFA;VENTOLIN HFA) 108 (90 Base) MCG/ACT inhaler Inhale 2 puffs into the lungs every 6 (six) hours as needed for wheezing or shortness of breath. 04/05/17   Love, Ivan Anchors, PA-C  becaplermin (REGRANEX) 0.01 % gel Apply 1 application topically daily. Measurements right 1st toe 1.6 x 0.6 x 0.1cm 10/10/18   Trula Slade, DPM     Vital Signs: Vitals:   02/03/20 1334  BP: 127/89  Pulse: 71  Resp: 20  Temp: 98 F (36.7 C)  SpO2: 99%      Physical Exam awake, alert.  Chest clear to auscultation bilaterally.  Clean, intact left chest wall Port-A-Cath.  Heart with normal rate, irregular rhythm.  Abdomen obese, soft, positive bowel sounds, nontender.  Bilateral LE edema noted.  Imaging: No results found.  Labs:  CBC: Recent Labs    03/11/19 1423 04/30/19 0813 01/07/20 1007  WBC 8.7 6.1 6.2  HGB 13.7 13.0 12.7*  HCT 41.1 40.8 40.5  PLT 190 187 147*    COAGS: No results for input(s): INR, APTT in the last 8760 hours.  BMP: Recent Labs    04/30/19 0813 11/11/19 1506 01/07/20 1007  NA 138 141 138  K 5.0 4.9 4.1  CL 101 105 105  CO2 _0 GLUCOSE 128* 80 110*  BUN _1 CALCIUM 8.8 9.3 9.3  CREATININE 0.99 0.86 0.84  GFRNONAA 81 92 >60  GFRAA 93 106  --     LIVER FUNCTION TESTS: Recent Labs    04/30/19 0813 01/07/20 1007  BILITOT 0.8 0.9  AST 22 22  ALT 16 17  ALKPHOS 150* 169*  PROT 6.6 7.2  ALBUMIN 3.7* 3.6    Assessment and Plan: Patient familiar to IR service from bone marrow biopsy in 2012.  He has a history of follicular lymphoma diagnosed in 2012, status post chemotherapy has been on observation over the past 8 years.  He presents today for Port-A-Cath removal (placed in 2011).  Details/risks of procedure, including but not limited to, internal bleeding, infection, injury to adjacent structures discussed with patient with his understanding and consent.   Electronically Signed: D. Rowe Robert, PA-C 02/03/2020, 1:32 PM   I spent a total  of 20 minutes at the the patient's bedside AND on the patient's hospital floor or unit, greater than 50% of which was counseling/coordinating care for Port-A-Cath removal

## 2020-02-03 NOTE — Procedures (Signed)
Interventional Radiology Procedure Note  Procedure: Successful removal of left chest portacatheter.   Complications: None  Estimated Blood Loss: None  Recommendations: - DC home    Signed,  Criselda Peaches, MD

## 2020-02-08 ENCOUNTER — Other Ambulatory Visit: Payer: Self-pay | Admitting: General Practice

## 2020-02-10 ENCOUNTER — Ambulatory Visit: Payer: BC Managed Care – PPO | Admitting: Podiatry

## 2020-02-10 ENCOUNTER — Other Ambulatory Visit: Payer: Self-pay

## 2020-02-10 DIAGNOSIS — L97522 Non-pressure chronic ulcer of other part of left foot with fat layer exposed: Secondary | ICD-10-CM

## 2020-02-10 NOTE — Progress Notes (Signed)
Subjective: [name] is a 65 y.o. is seen today for follow-up evaluation of a wound on the left foot.  He states he has been doing better.  He did try to clean the wound more remove some of the callus when it builds up.  He has not seen any drainage or pus any swelling or redness.  He has no other concerns today.   Insert/toe filler should be here later this week and scheduled for pickup next Monday.   He denies any fevers, chills, nausea, vomiting.  No calf pain, chest pain, shortness of breath.  No other concerns today.   Objective: General: No acute distress, AAOx3  DP/PT pulses palpable 2/4, CRT < 3 sec to all digits.  LEFT foot: At the distal aspect with a wound is present with hyperkeratotic tissue.  After debridement patient underlying ulceration is healed there is some mild macerated tissue.  There is no surrounding erythema, ascending cellulitis.  No fluctuation, crepitation, malodor.  No signs of infection today.   No pain with calf compression, swelling, warmth, erythema.   Assessment and Plan:  Left foot ulceration-appears to be healed but is preulcerative  -Treatment options discussed including all alternatives, risks, and complications -Sharply debrided the hyperkeratotic tissue today and the wound appears to be healed.  Macerated tissue I applied a small Betadine.  Continue offloading in surgical shoe for now.  Scheduled pick up the insert on Monday.  I will see him back in 4 weeks or sooner if needed.  Discussed daily foot inspection monitor for any reoccurrence.  Trula Slade DPM

## 2020-02-17 ENCOUNTER — Encounter: Payer: BC Managed Care – PPO | Admitting: Orthotics

## 2020-02-24 ENCOUNTER — Ambulatory Visit: Payer: BC Managed Care – PPO | Admitting: Orthotics

## 2020-02-24 ENCOUNTER — Other Ambulatory Visit: Payer: Self-pay

## 2020-02-24 DIAGNOSIS — L97529 Non-pressure chronic ulcer of other part of left foot with unspecified severity: Secondary | ICD-10-CM

## 2020-02-24 DIAGNOSIS — M205X9 Other deformities of toe(s) (acquired), unspecified foot: Secondary | ICD-10-CM

## 2020-02-24 DIAGNOSIS — E08621 Diabetes mellitus due to underlying condition with foot ulcer: Secondary | ICD-10-CM

## 2020-02-24 DIAGNOSIS — L97422 Non-pressure chronic ulcer of left heel and midfoot with fat layer exposed: Secondary | ICD-10-CM

## 2020-02-24 DIAGNOSIS — L97522 Non-pressure chronic ulcer of other part of left foot with fat layer exposed: Secondary | ICD-10-CM

## 2020-02-24 NOTE — Progress Notes (Signed)
Patient picked up f/o and was pleased with fit, comfort, and function.  Worked well with footwear.  Told of rbeak in period and how to report any issues.  

## 2020-03-08 NOTE — Progress Notes (Signed)
Cardiology Office Note   Date:  03/09/2020   ID:  Jontez, Redfield 1955/01/29, MRN [id number]  PCP:  [name], MD  Cardiologist:   Minus Breeding, MD   Chief Complaint  Patient presents with  . Shortness of Breath      History of Present Illness: [name] is a 65 y.o. male who presents for ongoing assessment and management of cardiomyopathy.  He has chronic shortness of breath. He has a past history of respiratory failure related to ARDS/influenza A with a reduced EF of 25% which was new, and new onset atrial fibrillation.  The EF returned to 45 - 50% on echo in Nov.  He has OSA followed by Dr. Lamonte Sakai.  He had increased fluid and has been switched to Torsemide.    With the switch to torsemide he has done much better.  I had doubled his diuretic but that did not really seem to work with the furosemide but he is doing much better on torsemide.  He did have follow-up labs.  His weight is down to what we think is his dry weight.  He has much less swelling in his legs although he still has chronic bilateral lower extremity edema.  His breathing is improved.  He denies any PND or orthopnea.  Has had no new chest pressure, neck or arm discomfort.  Past Medical History:  Diagnosis Date  . Acute systolic HF (heart failure) ([place])   . Arthritis    lt foot, hips knees   . Atrial fibrillation ([place])   . Diabetes mellitus, new onset ([place])   . Diverticulosis   . Dyslipidemia   . Gout attack 01/16/2012  . Hypertension   . Internal hemorrhoids   . Lymphoma ([place])   . NHL dx'd 01/2010  . Pneumonia due to COVID-19 virus 07/2018  . Pulmonary hypertension ([place])   . Sepsis ([place]) 02/2017   secondary to influenza; requiring trach  . Tubular adenoma of colon   . Venous stasis ulcers ([facility])     Past Surgical History:  Procedure Laterality Date  . AMPUTATION TOE Left 05/08/2019   Procedure: AMPUTATION LEFT GREAT  TOE;  Surgeon: Trula Slade, DPM;  Location: [facility];  Service: Podiatry;   Laterality: Left;  . BRONCHOSCOPY    . ESOPHAGOGASTRODUODENOSCOPY ENDOSCOPY     Dr Jearld Fenton  . INGUINAL HERNIA REPAIR Left    2001  . IR REMOVAL TUN ACCESS W/ PORT W/O FL MOD SED  02/03/2020  . PORTA CATH INSERTION  2011  . TRACHEOSTOMY  03/2017   with decannulation     Current Outpatient Medications  Medication Sig Dispense Refill  . acetaminophen (TYLENOL) 325 MG tablet Take 650 mg by mouth every 6 (six) hours as needed for moderate pain.     [name] Kitchen albuterol (PROVENTIL HFA;VENTOLIN HFA) 108 (90 Base) MCG/ACT inhaler Inhale 2 puffs into the lungs every 6 (six) hours as needed for wheezing or shortness of breath. 1 Inhaler 2  . allopurinol (ZYLOPRIM) 300 MG tablet Take 300 mg by mouth daily.    [name] Kitchen atorvastatin (LIPITOR) 20 MG tablet TAKE 1 TABLET BY MOUTH EVERY DAY *PT OVERDUE FOR OFFICE VISIT* 30 tablet 5  . becaplermin (REGRANEX) 0.01 % gel Apply 1 application topically daily. Measurements right 1st toe 1.6 x 0.6 x 0.1cm 15 g 5  . carvedilol (COREG) 6.25 MG tablet Take 1 tablet (6.25 mg total) by mouth 2 (two) times daily. Take with Carvedilol 12.5mg  by mouth twice a  day. 180 tablet 3  . collagenase (SANTYL) ointment Apply 1 application topically daily. Measurements to right hallux 2  X 1 x 0.1cm 30 g 5  . [employer] DIGESTIVE PROBIOTIC 250 MG capsule Take 500 mg by mouth daily.    [name] Kitchen ELIQUIS 5 MG TABS tablet TAKE 1 TABLET BY MOUTH TWICE A DAY 180 tablet 2  . KLOR-CON M20 20 MEQ tablet TAKE 1 TABLET BY MOUTH EVERY DAY 90 tablet 1  . losartan (COZAAR) 50 MG tablet TAKE 1 TABLET BY MOUTH EVERY MORNING AND AT BEDTIME 180 tablet 1  . Multiple Vitamin (MULTIVITAMIN WITH MINERALS) TABS tablet Take 1 tablet by mouth daily.    . mupirocin ointment (BACTROBAN) 2 % Apply 1 application topically 2 (two) times daily. 30 g 2  . omeprazole (PRILOSEC) 40 MG capsule Take 40 mg by mouth daily.     . sertraline (ZOLOFT) 50 MG tablet Take 1 tablet (50 mg total) by mouth daily. 30 tablet 0  . silver sulfADIAZINE  (SILVADENE) 1 % cream Apply 1 application topically daily. 50 g 2  . sodium chloride (OCEAN) 0.65 % SOLN nasal spray Place 2 sprays into both nostrils as needed for congestion.  0  . torsemide (DEMADEX) 20 MG tablet TAKE 1 TABLET BY MOUTH EVERY DAY 90 tablet 1  . vitamin C (VITAMIN C) 500 MG tablet Take 1 tablet (500 mg total) by mouth 3 (three) times daily.    . carvedilol (COREG) 12.5 MG tablet Take 1 tablet (12.5 mg total) by mouth 2 (two) times daily. 180 tablet 3   No current facility-administered medications for this visit.   Facility-Administered Medications Ordered in Other Visits  Medication Dose Route Frequency Provider Last Rate Last Admin  . sodium chloride 0.9 % injection 10 mL  10 mL Intravenous PRN Curt Bears, MD   10 mL at 05/04/15 1529    Allergies:   Latex and Penicillins    ROS:  Please see the history of present illness.   Otherwise, review of systems are positive for none.   All other systems are reviewed and negative.    PHYSICAL EXAM: VS:  BP (!) 140/102   Pulse 88   Ht 5' 10.5" (1.791 m)   Wt (!) 346 lb (156.9 kg)   SpO2 96%   BMI 48.94 kg/m  , BMI Body mass index is 48.94 kg/m. GENERAL:  Well appearing NECK:  No jugular venous distention, waveform within normal limits, carotid upstroke brisk and symmetric, no bruits, no thyromegaly LUNGS:  Clear to auscultation bilaterally CHEST:  Unremarkable HEART:  PMI not displaced or sustained,S1 and S2 within normal limits, no S3, no S4, no clicks, no rubs, no murmurs ABD:  Flat, positive bowel sounds normal in frequency in pitch, no bruits, no rebound, no guarding, no midline pulsatile mass, no hepatomegaly, no splenomegaly EXT:  2 plus pulses throughout, no edema, no cyanosis no clubbing  EKG:  EKG is  ordered today Atrial fibrillation, rate 96, right bundle branch block.  No change from previous  Recent Labs: 01/07/2020: ALT 17; BUN 17; Creatinine 0.84; Potassium 4.1; Sodium 138 02/03/2020: Hemoglobin  14.0; Platelets 162    Lipid Panel    Component Value Date/Time   TRIG 122 02/26/2017 1306      Wt Readings from Last 3 Encounters:  03/09/20 (!) 346 lb (156.9 kg)  01/07/20 (!) 343 lb 3.2 oz (155.7 kg)  11/11/19 (!) 365 lb (165.6 kg)      Other studies Reviewed: Additional studies/  records that were reviewed today include: None. Review of the above records demonstrates:  NA  ASSESSMENT AND PLAN:  CARDIOMYOPATHY:       I am going to try to titrate his carvedilol to 18.75 mg twice daily.  He can remain otherwise on the meds as listed.   ATRIAL FIB:    The carvedilol will be to further increase his ejection fraction but also for better rate control.  He is going to get a FitBit  DYSPNEA: This is improved.  We talked again about salt and fluid restriction.  Current medicines are reviewed at length with the patient today.  The patient does not have concerns regarding medicines.  The following changes have been made:   As above  Labs/ tests ordered today include:   None  Orders Placed This Encounter  Procedures  . EKG 12-Lead     Disposition:   FU with Coletta Memos NP in 3 months.      Signed, Minus Breeding, MD  03/09/2020 1:13 PM    [place]

## 2020-03-09 ENCOUNTER — Encounter: Payer: Self-pay | Admitting: Cardiology

## 2020-03-09 ENCOUNTER — Other Ambulatory Visit: Payer: Self-pay

## 2020-03-09 ENCOUNTER — Ambulatory Visit: Payer: BC Managed Care – PPO | Admitting: Podiatry

## 2020-03-09 ENCOUNTER — Ambulatory Visit: Payer: BC Managed Care – PPO | Admitting: Cardiology

## 2020-03-09 VITALS — BP 140/102 | HR 88 | Ht 70.5 in | Wt 346.0 lb

## 2020-03-09 DIAGNOSIS — I5022 Chronic systolic (congestive) heart failure: Secondary | ICD-10-CM | POA: Diagnosis not present

## 2020-03-09 DIAGNOSIS — I480 Paroxysmal atrial fibrillation: Secondary | ICD-10-CM | POA: Diagnosis not present

## 2020-03-09 DIAGNOSIS — R0602 Shortness of breath: Secondary | ICD-10-CM

## 2020-03-09 MED ORDER — CARVEDILOL 12.5 MG PO TABS: 18.7500 mg | ORAL_TABLET | Freq: Two times a day (BID) | ORAL | 3 refills | Status: DC

## 2020-03-09 MED ORDER — CARVEDILOL 6.25 MG PO TABS: 6.2500 mg | ORAL_TABLET | Freq: Two times a day (BID) | ORAL | 3 refills | Status: DC

## 2020-03-09 MED ORDER — CARVEDILOL 12.5 MG PO TABS: 12.5000 mg | ORAL_TABLET | Freq: Two times a day (BID) | ORAL | 3 refills | Status: DC

## 2020-03-09 NOTE — Patient Instructions (Addendum)
Medication Instructions: INCREASE CARVEDILOL TO 18.75mg  BY MOUTH TWICE A DAY. TAKE 12.5MG  ALONG WITH 6.25MG  TWICE A DAY.  *If you need a refill on your cardiac medications before your next appointment, please call your pharmacy*  Follow-Up: At [facility], you and your health needs are our priority.  As part of our continuing mission to provide you with exceptional heart care, we have created designated Provider Care Teams.  These Care Teams include your primary Cardiologist (physician) and Advanced Practice Providers (APPs -  Physician Assistants and Nurse Practitioners) who all work together to provide you with the care you need, when you need it.    Your next appointment: 3 month(s)  The format for your next appointment:   In Person  Provider:   Coletta Memos, NP

## 2020-03-23 ENCOUNTER — Ambulatory Visit: Payer: BC Managed Care – PPO | Admitting: Podiatry

## 2020-03-23 ENCOUNTER — Other Ambulatory Visit: Payer: Self-pay

## 2020-03-23 DIAGNOSIS — L97522 Non-pressure chronic ulcer of other part of left foot with fat layer exposed: Secondary | ICD-10-CM | POA: Diagnosis not present

## 2020-03-23 DIAGNOSIS — L988 Other specified disorders of the skin and subcutaneous tissue: Secondary | ICD-10-CM | POA: Diagnosis not present

## 2020-03-23 MED ORDER — DOXYCYCLINE HYCLATE 100 MG PO TABS
100.0000 mg | ORAL_TABLET | Freq: Two times a day (BID) | ORAL | 0 refills | Status: DC
Start: 2020-03-23 — End: 2020-06-15

## 2020-03-30 NOTE — Progress Notes (Signed)
Subjective: 65 year old male presents the office today for follow-up evaluation after getting his inserts.  He has been wearing them in the big toe amputation site is healed however he did develop a wound on the second toe.  He also had this previously which healed however the wound is come back.  Denies any drainage or pus.  No redness or swelling.  Denies any fevers, chills, nausea, vomiting.  No calf pain, chest pain, shortness of breath.  Objective: AAO x3, NAD DP/PT pulses palpable bilaterally, CRT less than 3 seconds Amputation site from the partial first ray dictations well-healed.  On the dorsal aspect of the second toe as well as the third toe there is macerated tissue and reoccurrence of the ulceration on the PIPJ.  Mild edema with faint erythema but there is no ascending cellulitis but there is no fluctuation crepitation there is no malodor there is macerated skin submetatarsal 1 through 5 plantarly without any skin breakdown otherwise. No pain with calf compression, swelling, warmth, erythema  Assessment: Ulceration second digit with macerated skin  Plan: -All treatment options discussed with the patient including all alternatives, risks, complications.  -I think that the maceration is was causing the irritation.  Is also wearing the insert inside of his work shoes.  He recently put the inside of sneakers which seem to be doing better.  On to hold off the insert for nail clipping this may be causing moisturize his feet sweat during the day as well with walking. -I applied Betadine to the wound as well as the plantar foot given the macerated tissue.  Prescribe doxycycline.  Recommend him to wear surgical shoe.  Elevation. -Monitor for any clinical signs or symptoms of infection and directed to call the office immediately should any occur or go to the ER. -Patient encouraged to call the office with any questions, concerns, change in symptoms.   Trula Slade DPM

## 2020-04-27 ENCOUNTER — Ambulatory Visit: Payer: BC Managed Care – PPO | Admitting: Podiatry

## 2020-04-27 ENCOUNTER — Other Ambulatory Visit: Payer: Self-pay

## 2020-04-27 DIAGNOSIS — L97522 Non-pressure chronic ulcer of other part of left foot with fat layer exposed: Secondary | ICD-10-CM | POA: Diagnosis not present

## 2020-04-27 MED ORDER — SANTYL 250 UNIT/GM EX OINT: 1.0000 "application " | TOPICAL_OINTMENT | Freq: Every day | CUTANEOUS | 0 refills | Status: DC

## 2020-04-27 MED ORDER — DOXYCYCLINE HYCLATE 100 MG PO TABS: 100.0000 mg | ORAL_TABLET | Freq: Two times a day (BID) | ORAL | 0 refills | Status: DC

## 2020-04-29 NOTE — Progress Notes (Signed)
Subjective: 65 year old male presents the office today for follow-up evaluation of a wound on the second digit of the left foot.  He states the area does drain some but no pus.  More clear drainage.  Denies any increase in swelling or redness.  No streaking identified.  No pain.  Denies any fevers, chills, nausea, vomiting.  No calf pain, chest pain, shortness of breath.  Objective: AAO x3, NAD DP/PT pulses palpable bilaterally, CRT less than 3 seconds Amputation site previously has healed.  Ulceration remains on the dorsal aspect of the second toe today measuring approximately 1.2 x 1 x 0.2 cm with a fibrotic wound base.  There is no probing to bone, undermining or tunneling.  Small hyperkeratotic lesion present with small superficial granular wound.  There is no probing, undermining or tunneling of this area.  There is mild chronic edema of the second toe there is no erythema or warmth.  No ascending cellulitis.  No fluctuation or crepitation. No pain with calf compression, swelling, warmth, erythema  Assessment: Ulceration second digit   Plan: -All treatment options discussed with the patient including all alternatives, risks, complications.  -Sharply debride the wound on the second toe with any complications and healthy, granular tissue.  We will switch to using Santyl which is ordered for him today.  Recommended offloading surgical shoe.  He has been wearing compression socks as well which are very tight on the toes I think this may be also contributing which so we can hold off on this. -Monitor for any clinical signs or symptoms of infection and directed to call the office immediately should any occur or go to the ER.  Return in about 2 weeks (around 05/11/2020).  Trula Slade DPM

## 2020-05-11 ENCOUNTER — Other Ambulatory Visit: Payer: Self-pay

## 2020-05-11 ENCOUNTER — Ambulatory Visit: Payer: BC Managed Care – PPO | Admitting: Podiatry

## 2020-05-11 ENCOUNTER — Ambulatory Visit (INDEPENDENT_AMBULATORY_CARE_PROVIDER_SITE_OTHER): Payer: BC Managed Care – PPO

## 2020-05-11 DIAGNOSIS — M2042 Other hammer toe(s) (acquired), left foot: Secondary | ICD-10-CM

## 2020-05-11 DIAGNOSIS — L97522 Non-pressure chronic ulcer of other part of left foot with fat layer exposed: Secondary | ICD-10-CM

## 2020-05-12 NOTE — Progress Notes (Signed)
Subjective: 65 year old male presents the office today for follow-up evaluation of a wound on the second digit of the left foot.  He states that he has been using the Santyl on the wound but he ran out today.  This has been helping some.  Some clear drainage but no pus.  He has no pain. Denies any fevers, chills, nausea, vomiting.  No calf pain, chest pain, shortness of breath.  Objective: AAO x3, NAD DP/PT pulses palpable bilaterally, CRT less than 3 seconds Amputation site previously has healed.  Ulceration remains on the dorsal aspect of the second toe today measuring approximately 1.2 x 1 x 0.1 cm with a fibrotic wound base.  Small on a clear drainage but there is no pus.  No swelling erythema, ascending cellulitis.  There is no fluctuance or crepitation.  No malodor. No pain with calf compression, swelling, warmth, erythema  Assessment: Ulceration second digit   Plan: -All treatment options discussed with the patient including all alternatives, risks, complications.  -X-rays obtained reviewed.  No evidence of acute fracture, osteomyelitis or soft tissue emphysema. -Sharply debride the wound on the second toe with a #312 with scalpel without any complications and healthy, granular tissue.  Continue with Santyl.  Awaiting insurance approval.  Recommended offloading surgical shoe.  He has been wearing compression socks as well which are very tight on the toes I think this may be also contributing which so we can hold off on this. -Monitor for any clinical signs or symptoms of infection and directed to call the office immediately should any occur or go to the ER.  Return in about 2 weeks   Trula Slade DPM

## 2020-05-25 ENCOUNTER — Other Ambulatory Visit: Payer: Self-pay

## 2020-05-25 ENCOUNTER — Ambulatory Visit: Payer: BC Managed Care – PPO | Admitting: Podiatry

## 2020-05-25 ENCOUNTER — Encounter: Payer: Self-pay | Admitting: Podiatry

## 2020-05-25 DIAGNOSIS — L97522 Non-pressure chronic ulcer of other part of left foot with fat layer exposed: Secondary | ICD-10-CM

## 2020-05-25 DIAGNOSIS — L988 Other specified disorders of the skin and subcutaneous tissue: Secondary | ICD-10-CM | POA: Diagnosis not present

## 2020-05-25 DIAGNOSIS — L539 Erythematous condition, unspecified: Secondary | ICD-10-CM

## 2020-05-25 DIAGNOSIS — M2042 Other hammer toe(s) (acquired), left foot: Secondary | ICD-10-CM

## 2020-05-25 MED ORDER — SULFAMETHOXAZOLE-TRIMETHOPRIM 800-160 MG PO TABS: 1.0000 | ORAL_TABLET | Freq: Two times a day (BID) | ORAL | 0 refills | Status: DC

## 2020-05-25 NOTE — Progress Notes (Signed)
Subjective: 65 year old male presents the office today for follow-up evaluation of a wound on the second digit of the left foot.  The patient ordered Santyl for the wound but he did not get this.  I did paperwork to complete authorization and was told his improvement he was not able to get this.  He has been keeping on that he had at home on the wound.  He has noticed some clear drainage but no pus.  Started to spread over to the dorsal third toe as well.  Denies any fevers, chills, nausea, vomiting.  No calf pain, chest pain or shortness of breath.  Objective: AAO x3, NAD-he presents today wearing a regular shoe and sock.  The sock is moist. DP/PT pulses palpable bilaterally, CRT less than 3 seconds Amputation site previously has healed.  Ulceration remains on the dorsal aspect of second toe with fibrotic tissue.  Upon debridement granulation tissue is present and there is no probing to bone, undermining or tunneling.  Mild surrounding erythema.  No ascending cellulitis.  The area in the third toe on the picture below was very superficial and no skin breakdown identified otherwise.  There is no drainage coming from the third digit.  Rigid digital deformity present with second digit. No pain with calf compression, swelling, warmth, erythema      Assessment: Ulceration second digit   Plan: -All treatment options discussed with the patient including all alternatives, risks, complications.  -I sharply debrided the wound today utilizing the 312 with scalpel.  A debrided the wound down to healthy, viable tissue to help promote wound healing.  The picture above is prior to debridement.  Seem to like he was told was approved and he can get this at the pharmacy.  If there is any issues to let me know.  Prescribe doxycycline.  I want to go back into the surgical shoe at all times and elevate the foot.  Try to limit activity.  Discussed he is at high risk of amputation of the toe. -Monitor for any clinical  signs or symptoms of infection and directed to call the office immediately should any occur or go to the ER.  Trula Slade DPM

## 2020-06-01 ENCOUNTER — Ambulatory Visit: Payer: BC Managed Care – PPO | Admitting: Podiatry

## 2020-06-01 ENCOUNTER — Other Ambulatory Visit: Payer: Self-pay

## 2020-06-01 DIAGNOSIS — L97522 Non-pressure chronic ulcer of other part of left foot with fat layer exposed: Secondary | ICD-10-CM | POA: Diagnosis not present

## 2020-06-07 NOTE — Progress Notes (Signed)
Subjective: 65 year old male presents the office today for follow-up evaluation of a wound on the second digit of the left foot.  Since I last saw him is been continue with Santyl which is been helping some and he feels that the wound is getting better and is getting more superficial.  Second less drainage.  Denies any fevers, chills, nausea, vomiting.  No calf pain, chest pain, shortness of breath.  Objective: AAO x3, NAD-he presents today wearing a regular shoe and sock.  The sock is moist. DP/PT pulses palpable bilaterally, CRT less than 3 seconds Amputation site previously has healed.  Ulceration of the dorsal aspect second toe extending towards the third.  With fibrotic tissue there is more granulation tissue present on the periphery of the wound.  Wound is a depth about 0.1 cm and there is no probing to bone and there is no exposed tendon.  No significant surrounding erythema, ascending cellulitis.  No fluctuance or crepitation.  No malodor. No pain with calf compression, swelling, warmth, erythema  Assessment: Ulceration second digit   Plan: -All treatment options discussed with the patient including all alternatives, risks, complications.  -Wound with minimal improvement.  I sharply debrided the wound today last #312 scalpel down to healthy, bleeding, granulation tissue to promote wound healing and to remove nonviable, devitalized tissue.  Minimal loss and hemostasis was achieved through manual compression.  He tolerated well.  Continue with Santyl dressing changes and offloading.  Recommend he wear surgical shoe all the time.  Wearing a regular shoe today.  Fungus still damp and discussed changing socks regularly.  Elevation. -Monitor for any clinical signs or symptoms of infection and directed to call the office immediately should any occur or go to the ER.  Return in about 2 weeks (around 06/15/2020).  Trula Slade DPM

## 2020-06-15 ENCOUNTER — Other Ambulatory Visit: Payer: Self-pay

## 2020-06-15 ENCOUNTER — Ambulatory Visit: Payer: BC Managed Care – PPO | Admitting: Podiatry

## 2020-06-15 DIAGNOSIS — L97522 Non-pressure chronic ulcer of other part of left foot with fat layer exposed: Secondary | ICD-10-CM

## 2020-06-15 MED ORDER — DOXYCYCLINE HYCLATE 100 MG PO TABS: 100.0000 mg | ORAL_TABLET | Freq: Two times a day (BID) | ORAL | 0 refills | Status: DC

## 2020-06-20 NOTE — Progress Notes (Signed)
Subjective: 65 year old male presents the office today for follow-up evaluation of a wound on the second digit of the left foot.  He states the drainage is improved and he has been continue with the Santyl this is been helping.  He did cancel his upcoming trip to [place] appointment.  Denies any fevers or chills.  No nausea or vomiting.   Objective: AAO x3, NAD-he presents today wearing surgical shoe. DP/PT pulses palpable bilaterally, CRT less than 3 seconds Amputation site previously has healed.  Ulceration of the dorsal left second toe as well as left third toe with increased granulation tissue but still some fibrotic tissue prior to debridement of the wound.  See picture below.  There is no exposed bone or tendon.   Arthritic changes present of the second toe and is rigid.   No significant increase in erythema or warmth there is any cellulitis.  No warmth of the foot. No pain with calf compression, swelling, warmth, erythema       Assessment: Ulceration second/third digit   Plan: -All treatment options discussed with the patient including all alternatives, risks, complications.  -I sharply debrided the wound today utilizing the 312 scalpel and curette.  A debrided the wound down to healthy, viable tissue to help promote wound healing.  The picture above is after to debridement.  Wound appears to be healthier overall.  We will try to work on getting him approved for graft in the office through [employer].  He can be out of town for the next 2 weeks I encouraged him to stay off the foot, keep the foot dry and monitor closely for any signs or symptoms of infection.  I did go and prescribe doxycycline for him to take while he is out of town in case of any infection and he can always call the office as well.  Trula Slade DPM     Seem to like he was told was approved and he can get this at the pharmacy.  If there is any issues to let me know.  Prescribe doxycycline.  I want to go  back into the surgical shoe at all times and elevate the foot.  Try to limit activity.  Discussed he is at high risk of amputation of the toe. -Monitor for any clinical signs or symptoms of infection and directed to call the office immediately should any occur or go to the ER.  Trula Slade DPM

## 2020-06-22 ENCOUNTER — Ambulatory Visit: Payer: BC Managed Care – PPO | Admitting: General Practice

## 2020-06-24 ENCOUNTER — Other Ambulatory Visit: Payer: Self-pay | Admitting: Cardiology

## 2020-06-24 NOTE — Telephone Encounter (Signed)
Prescription refill request for Eliquis received. Indication:atrial fib Last office visit:2/22 Scr:0.8 Age: 65 Weight:156.9 kg  Prescription refilled

## 2020-07-06 ENCOUNTER — Encounter (HOSPITAL_COMMUNITY): Payer: Self-pay | Admitting: Pharmacy Technician

## 2020-07-06 ENCOUNTER — Emergency Department (HOSPITAL_COMMUNITY)
Admission: EM | Admit: 2020-07-06 | Discharge: 2020-07-06 | Disposition: A | Discharge status: Home / Self Care | Payer: BC Managed Care – PPO | Attending: Emergency Medicine | Admitting: Emergency Medicine

## 2020-07-06 ENCOUNTER — Emergency Department (HOSPITAL_BASED_OUTPATIENT_CLINIC_OR_DEPARTMENT_OTHER): Payer: BC Managed Care – PPO

## 2020-07-06 ENCOUNTER — Emergency Department (HOSPITAL_COMMUNITY): Payer: BC Managed Care – PPO

## 2020-07-06 ENCOUNTER — Other Ambulatory Visit: Payer: Self-pay

## 2020-07-06 DIAGNOSIS — J811 Chronic pulmonary edema: Secondary | ICD-10-CM | POA: Diagnosis not present

## 2020-07-06 DIAGNOSIS — M7989 Other specified soft tissue disorders: Secondary | ICD-10-CM | POA: Diagnosis not present

## 2020-07-06 DIAGNOSIS — I5023 Acute on chronic systolic (congestive) heart failure: Secondary | ICD-10-CM | POA: Insufficient documentation

## 2020-07-06 DIAGNOSIS — I110 Hypertensive heart disease with heart failure: Secondary | ICD-10-CM | POA: Insufficient documentation

## 2020-07-06 DIAGNOSIS — R0600 Dyspnea, unspecified: Secondary | ICD-10-CM | POA: Diagnosis not present

## 2020-07-06 DIAGNOSIS — Z7901 Long term (current) use of anticoagulants: Secondary | ICD-10-CM | POA: Diagnosis not present

## 2020-07-06 DIAGNOSIS — Z20822 Contact with and (suspected) exposure to covid-19: Secondary | ICD-10-CM | POA: Insufficient documentation

## 2020-07-06 DIAGNOSIS — J80 Acute respiratory distress syndrome: Secondary | ICD-10-CM | POA: Diagnosis not present

## 2020-07-06 DIAGNOSIS — Z91040 Latex allergy status: Secondary | ICD-10-CM | POA: Diagnosis not present

## 2020-07-06 DIAGNOSIS — I4891 Unspecified atrial fibrillation: Secondary | ICD-10-CM | POA: Insufficient documentation

## 2020-07-06 DIAGNOSIS — Z8616 Personal history of COVID-19: Secondary | ICD-10-CM | POA: Diagnosis not present

## 2020-07-06 DIAGNOSIS — R0602 Shortness of breath: Secondary | ICD-10-CM

## 2020-07-06 DIAGNOSIS — R600 Localized edema: Secondary | ICD-10-CM | POA: Insufficient documentation

## 2020-07-06 DIAGNOSIS — R609 Edema, unspecified: Secondary | ICD-10-CM | POA: Diagnosis not present

## 2020-07-06 DIAGNOSIS — Z79899 Other long term (current) drug therapy: Secondary | ICD-10-CM | POA: Diagnosis not present

## 2020-07-06 DIAGNOSIS — Z87891 Personal history of nicotine dependence: Secondary | ICD-10-CM | POA: Insufficient documentation

## 2020-07-06 DIAGNOSIS — E119 Type 2 diabetes mellitus without complications: Secondary | ICD-10-CM | POA: Diagnosis not present

## 2020-07-06 DIAGNOSIS — I509 Heart failure, unspecified: Secondary | ICD-10-CM | POA: Diagnosis not present

## 2020-07-06 LAB — RESP PANEL BY RT-PCR (FLU A&B, COVID) ARPGX2
Influenza A by PCR: NEGATIVE
Influenza B by PCR: NEGATIVE
SARS Coronavirus 2 by RT PCR: NEGATIVE

## 2020-07-06 LAB — CBC WITH DIFFERENTIAL/PLATELET
Abs Immature Granulocytes: 0.03 10*3/uL (ref 0.00–0.07)
Basophils Absolute: 0.1 10*3/uL (ref 0.0–0.1)
Basophils Relative: 1 %
Eosinophils Absolute: 0 10*3/uL (ref 0.0–0.5)
Eosinophils Relative: 0 %
HCT: 42.4 % (ref 39.0–52.0)
Hemoglobin: 13.8 g/dL (ref 13.0–17.0)
Immature Granulocytes: 1 %
Lymphocytes Relative: 10 %
Lymphs Abs: 0.7 10*3/uL (ref 0.7–4.0)
MCH: 32.2 pg (ref 26.0–34.0)
MCHC: 32.5 g/dL (ref 30.0–36.0)
MCV: 99.1 fL (ref 80.0–100.0)
Monocytes Absolute: 0.7 10*3/uL (ref 0.1–1.0)
Monocytes Relative: 11 %
Neutro Abs: 5 10*3/uL (ref 1.7–7.7)
Neutrophils Relative %: 77 %
Platelets: 134 10*3/uL — ABNORMAL LOW (ref 150–400)
RBC: 4.28 MIL/uL (ref 4.22–5.81)
RDW: 15.9 % — ABNORMAL HIGH (ref 11.5–15.5)
WBC: 6.5 10*3/uL (ref 4.0–10.5)
nRBC: 0 % (ref 0.0–0.2)

## 2020-07-06 LAB — COMPREHENSIVE METABOLIC PANEL
ALT: 20 U/L (ref 0–44)
AST: 25 U/L (ref 15–41)
Albumin: 3.4 g/dL — ABNORMAL LOW (ref 3.5–5.0)
Alkaline Phosphatase: 129 U/L — ABNORMAL HIGH (ref 38–126)
Anion gap: 11 (ref 5–15)
BUN: 18 mg/dL (ref 8–23)
CO2: 20 mmol/L — ABNORMAL LOW (ref 22–32)
Calcium: 8.9 mg/dL (ref 8.9–10.3)
Chloride: 101 mmol/L (ref 98–111)
Creatinine, Ser: 0.97 mg/dL (ref 0.61–1.24)
GFR, Estimated: >60 mL/min (ref >60)
Glucose, Bld: 126 mg/dL — ABNORMAL HIGH (ref 70–99)
Potassium: 4.4 mmol/L (ref 3.5–5.1)
Sodium: 132 mmol/L — ABNORMAL LOW (ref 135–145)
Total Bilirubin: 1.5 mg/dL — ABNORMAL HIGH (ref 0.3–1.2)
Total Protein: 6.9 g/dL (ref 6.5–8.1)

## 2020-07-06 LAB — TROPONIN I (HIGH SENSITIVITY)
Troponin I (High Sensitivity): 30 ng/L — ABNORMAL HIGH (ref <18)
Troponin I (High Sensitivity): 31 ng/L — ABNORMAL HIGH (ref <18)

## 2020-07-06 LAB — I-STAT VENOUS BLOOD GAS, ED
Acid-Base Excess: 0 mmol/L (ref 0.0–2.0)
Bicarbonate: 21.5 mmol/L (ref 20.0–28.0)
Calcium, Ion: 1.06 mmol/L — ABNORMAL LOW (ref 1.15–1.40)
HCT: 42 % (ref 39.0–52.0)
Hemoglobin: 14.3 g/dL (ref 13.0–17.0)
O2 Saturation: 99 %
Potassium: 4.4 mmol/L (ref 3.5–5.1)
Sodium: 133 mmol/L — ABNORMAL LOW (ref 135–145)
TCO2: 22 mmol/L (ref 22–32)
pCO2, Ven: 26.4 mmHg — ABNORMAL LOW (ref 44.0–60.0)
pH, Ven: 7.519 — ABNORMAL HIGH (ref 7.250–7.430)
pO2, Ven: 123 mmHg — ABNORMAL HIGH (ref 32.0–45.0)

## 2020-07-06 LAB — BRAIN NATRIURETIC PEPTIDE: B Natriuretic Peptide: 917.2 pg/mL — ABNORMAL HIGH (ref 0.0–100.0)

## 2020-07-06 MED ORDER — POTASSIUM CHLORIDE CRYS ER 20 MEQ PO TBCR
20.0000 meq | EXTENDED_RELEASE_TABLET | Freq: Once | ORAL | Status: AC
  Administered 2020-07-06: 20 meq via ORAL
  Filled 2020-07-06: qty 1

## 2020-07-06 MED ORDER — FUROSEMIDE 10 MG/ML IJ SOLN
80.0000 mg | Freq: Once | INTRAMUSCULAR | Status: AC
  Administered 2020-07-06: 80 mg via INTRAVENOUS
  Filled 2020-07-06: qty 8

## 2020-07-06 NOTE — Plan of Care (Signed)
65 yo male w/PMH chronic HFrEF, permanent rate controlled afib, OSA, obesity, chronic venous stasis, [place] lymphoma.  Presented to ED with chest tightness, dyspnea, orthopnea.  He was drinking fluid torsemide stopped working and he drank more fluid. Had elevated bnp, vascular congestion on chest x-ray.  Ekg with rate controlled afib and RBBB.  Troponin 30 and flat, presentation not consistent with ACS.  Initially required bipap but was tapered off to room air fairly quickly.    Cardiac: JVD 10, irregularly irregular with normal rate, clear s1 and s2, no murmurs, rubs or gallops, 1-2+ LE edema Pulmonary: Decreased breath sounds in bases, not in distress Abdominal: non distended abdomen, soft and nontender Psych: Alert, conversant, in good spirits   Assessment/Plan: Acute on chronic systolic CHF: -Excellent response to IV lasix 80mg  output 6L so far with dramatic improvement in symptoms, remains volume overloaded -Continue IV lasix will decrease to 40mg  daily likely will only need a few more days -Repeat bmp, daily weights/I's and O's -Continue home meds -Close follow up with me in [facility] clinic or with his primary cardiologist

## 2020-07-06 NOTE — ED Notes (Signed)
Patient ambulated in room on room air, oxygen sats stayed between 92%-94%. EDP notified.

## 2020-07-06 NOTE — ED Notes (Signed)
Pt weaned off of oxygen. Saturations remain 96%, Respirations even and unlabored.

## 2020-07-06 NOTE — ED Provider Notes (Signed)
[facility] Provider Note   CSN: [id number] Arrival date & time: 07/06/20  1002     History Chief Complaint  Patient presents with   Respiratory Distress    [name] is a 65 y.o. male with a hx of CHF last EF 45-50%, afib on Eliquis,, DM, hypertension, dyslipidemia, pulmonary hypertension, venous stasis, OSA, cardiomyopathy, COPD, & non hodgkins who presents to the ED via EMS for evaluation of dyspnea x 3 days. Progressively worsening, aggravated when in the supine position & with exertion. Associated dry cough and chest tightness centrally. Feels he is wheezing at times. Tried inhaler without relief. Has chronic LE swelling. Denies fever, nausea, vomiting, syncope, abdominal pain, hemoptysis, or leg pain. Per EMS hypoxic in the 80s on arrival, tachypneic on NRB, transitioned to CPAP with improvement. Patient states this has helped a lot.   HPI     Past Medical History:  Diagnosis Date   Acute systolic HF (heart failure) (HCC)    Arthritis    lt foot, hips knees    Atrial fibrillation ([place])    Diabetes mellitus, new onset ([place])    Diverticulosis    Dyslipidemia    Gout attack 01/16/2012   Hypertension    Internal hemorrhoids    Lymphoma ([place])    NHL dx'd 01/2010   Pneumonia due to COVID-19 virus 07/2018   Pulmonary hypertension ([place])    Sepsis ([place]) 02/2017   secondary to influenza; requiring trach   Tubular adenoma of colon    Venous stasis ulcers ([place])     Patient Active Problem List   Diagnosis Date Noted   Chronic systolic HF (heart failure) ([place]) 07/21/2019   Cough 07/12/2019   Educated about COVID-19 virus infection 05/12/2019   Non-pressure chronic ulcer of other part of left foot with unspecified severity ([place]) 04/11/2019   Pain in limb 04/11/2019   Adverse effect of other vaccines and biological substances, initial encounter 04/10/2019   COVID-19 virus infection 08/07/2018   Recurrent pneumonia 02/26/2018    Port-A-Cath in place 01/08/2018   History of colonic polyps 12/11/2017   Thrombophilia ([place]) 10/30/2017   Heart failure ([place]) 07/04/2017   Dyspnea on exertion 06/19/2017   Chronic anticoagulation 05/22/2017   Cardiomyopathy ([place]) 05/22/2017   Hypotension 05/22/2017   RBBB 05/22/2017   UTI due to extended-spectrum beta lactamase (ESBL) producing Escherichia coli 04/06/2017   Debility    Wound infection    OSA (obstructive sleep apnea)    Acute systolic heart failure (HCC)    Morbid obesity (HCC)    Diabetes mellitus, new onset ([place])    Dysuria    Acute respiratory distress syndrome (ARDS) (HCC)    Influenza A with pneumonia    New onset atrial fibrillation (HCC)    Anxiety    Current moderate episode of major depressive disorder without prior episode (HCC)    Obesity, Class III, BMI 40-49.9 (morbid obesity) ([place])    Community acquired pneumonia    Critical illness myopathy    Dysphagia    Non-Hodgkin's lymphoma ([place])    Benign essential HTN    Leukocytosis    Hypokalemia    Tracheostomy status ([place])    Hypoxic    Pressure injury of skin 02/28/2017   Status post tracheostomy ([place])    Follicular lymphoma grade II of intrathoracic lymph nodes ([place]) 05/02/2016   Portacath in place 05/04/2015   Localized, primary osteoarthritis of hand 09/08/2014   Atherosclerotic heart disease of native coronary artery without  angina pectoris 08/26/2013   Tobacco user 07/30/2012   Gout attack 01/16/2012   Psoriasis 53/97/6734   Follicular lymphoma grade II ([place]) 12/16/2010   Personal history of non-Hodgkin lymphomas 09/20/2010   Peripheral venous insufficiency 11/16/2009   Encounter for general adult medical examination without abnormal findings 06/16/2009   Hyperlipidemia 06/16/2009   Impaired fasting glucose 06/16/2009   Proteinuria 06/16/2009   Varicose veins of other specified sites 06/16/2009    Past Surgical History:  Procedure Laterality Date   AMPUTATION TOE Left 05/08/2019    Procedure: AMPUTATION LEFT GREAT  TOE;  Surgeon: Trula Slade, DPM;  Location: [facility];  Service: Podiatry;  Laterality: Left;   BRONCHOSCOPY     ESOPHAGOGASTRODUODENOSCOPY ENDOSCOPY     Dr Royden Purl HERNIA REPAIR Left    2001   IR REMOVAL TUN ACCESS W/ PORT W/O FL MOD SED  02/03/2020   PORTA CATH INSERTION  2011   TRACHEOSTOMY  03/2017   with decannulation       Family History  Problem Relation Age of Onset   Dementia Brother 41       frontal lobe    Dementia Mother    Arthritis Mother    Hypertension Mother    CVA Sister 73   Colon cancer Neg Hx    Pancreatic cancer Neg Hx    Stomach cancer Neg Hx    Rectal cancer Neg Hx    Liver cancer Neg Hx     Social History   Tobacco Use   Smoking status: Former    Packs/day: 0.50    Years: 20.00    Pack years: 10.00    Types: Cigarettes    Quit date: 01/10/2009    Years since quitting: 11.4   Smokeless tobacco: Never  Vaping Use   Vaping Use: Never used  Substance Use Topics   Alcohol use: Yes    Comment: 2-3 times per week   Drug use: No    Home Medications Prior to Admission medications   Medication Sig Start Date End Date Taking? Authorizing Provider  acetaminophen (TYLENOL) 325 MG tablet Take 650 mg by mouth every 6 (six) hours as needed for moderate pain.     [provider]  albuterol (PROVENTIL HFA;VENTOLIN HFA) 108 (90 Base) MCG/ACT inhaler Inhale 2 puffs into the lungs every 6 (six) hours as needed for wheezing or shortness of breath. 04/05/17   Love, Ivan Anchors, PA-C  allopurinol (ZYLOPRIM) 300 MG tablet Take 300 mg by mouth daily. 12/03/18   [provider]  apixaban (ELIQUIS) 5 MG TABS tablet TAKE 1 TABLET BY MOUTH TWICE A DAY 06/24/20   Minus Breeding, MD  atorvastatin (LIPITOR) 20 MG tablet TAKE 1 TABLET BY MOUTH EVERY DAY *PT OVERDUE FOR OFFICE VISIT* 04/02/18   Minus Breeding, MD  becaplermin (REGRANEX) 0.01 % gel Apply 1 application topically daily. Measurements right 1st toe  1.6 x 0.6 x 0.1cm 10/10/18   Trula Slade, DPM  carvedilol (COREG) 12.5 MG tablet Take 1 tablet (12.5 mg total) by mouth 2 (two) times daily. 03/09/20   Minus Breeding, MD  carvedilol (COREG) 6.25 MG tablet Take 1 tablet (6.25 mg total) by mouth 2 (two) times daily. Take with Carvedilol 12.33m by mouth twice a day. 03/09/20   HMinus Breeding MD  colchicine (COLCRYS) 0.6 MG tablet take 2 tablets at onset of flare, repeat 1 tablet in 1 hour if needed, then every 8 hours for gout flare 09/03/13   [provider]  collagenase (SANTYL) ointment Apply 1 application topically daily. Measurements to right hallux 2  X 1 x 0.1cm 02/18/19   Trula Slade, DPM  collagenase (SANTYL) ointment Apply 1 application topically daily. 04/27/20   Trula Slade, DPM  [employer] DIGESTIVE PROBIOTIC 250 MG capsule Take 500 mg by mouth daily. 08/03/18   [provider]  doxycycline (VIBRA-TABS) 100 MG tablet Take 1 tablet (100 mg total) by mouth 2 (two) times daily. 04/27/20   Trula Slade, DPM  doxycycline (VIBRA-TABS) 100 MG tablet Take 1 tablet (100 mg total) by mouth 2 (two) times daily. 06/15/20   Trula Slade, DPM  furosemide (LASIX) 20 MG tablet 2 pills daily 07/04/17   [provider]  KLOR-CON M20 20 MEQ tablet TAKE 1 TABLET BY MOUTH EVERY DAY 02/10/20   Minus Breeding, MD  losartan (COZAAR) 50 MG tablet TAKE 1 TABLET BY MOUTH EVERY MORNING AND AT BEDTIME 01/15/20   Minus Breeding, MD  Multiple Vitamin (MULTIVITAMIN WITH MINERALS) TABS tablet Take 1 tablet by mouth daily. 04/04/17   Love, Ivan Anchors, PA-C  mupirocin ointment (BACTROBAN) 2 % Apply 1 application topically 2 (two) times daily. 07/30/18   Trula Slade, DPM  Omega 3 340 MG CPDR Take 1 tablet by mouth 2 (two) times daily. 06/16/09   [provider]  omeprazole (PRILOSEC) 40 MG capsule Take 40 mg by mouth daily.  10/15/18   [provider]  sertraline (ZOLOFT) 50 MG tablet Take 1 tablet (50 mg total) by  mouth daily. 04/05/17   Love, Ivan Anchors, PA-C  silver sulfADIAZINE (SILVADENE) 1 % cream Apply 1 application topically daily. 09/02/19   Trula Slade, DPM  sodium chloride (OCEAN) 0.65 % SOLN nasal spray Place 2 sprays into both nostrils as needed for congestion. 04/05/17   Love, Ivan Anchors, PA-C  sulfamethoxazole-trimethoprim (BACTRIM DS) 800-160 MG tablet Take 1 tablet by mouth 2 (two) times daily. 05/25/20   Trula Slade, DPM  torsemide (DEMADEX) 20 MG tablet TAKE 1 TABLET BY MOUTH EVERY DAY 02/04/20   Minus Breeding, MD  vitamin C (VITAMIN C) 500 MG tablet Take 1 tablet (500 mg total) by mouth 3 (three) times daily. 04/05/17   Love, Ivan Anchors, PA-C    Allergies    Latex and Penicillins  Review of Systems   Review of Systems  Constitutional:  Negative for fever.  Respiratory:  Positive for cough, chest tightness and shortness of breath.   Cardiovascular:  Positive for leg swelling (chronic).  Gastrointestinal:  Negative for abdominal pain, nausea and vomiting.  Neurological:  Negative for syncope.  All other systems reviewed and are negative.  Physical Exam Updated Vital Signs BP 138/82   Pulse 84   Resp 20   SpO2 100%   Physical Exam Vitals and nursing note reviewed.  Constitutional:      General: He is not in acute distress.    Appearance: He is well-developed. He is not toxic-appearing.  HENT:     Head: Normocephalic and atraumatic.  Eyes:     General:        Right eye: No discharge.        Left eye: No discharge.     Conjunctiva/sclera: Conjunctivae normal.  Cardiovascular:     Rate and Rhythm: Normal rate and regular rhythm.  Pulmonary:     Effort: No respiratory distress.     Breath sounds: Decreased air movement (bases) present. No wheezing, rhonchi or rales.  Comments: Breathing comfortably on BIPAP.  Abdominal:     General: There is no distension.     Palpations: Abdomen is soft.     Tenderness: There is no abdominal tenderness.  Musculoskeletal:      Cervical back: Neck supple.     Comments: Bilateral lower extremity edema. Left calf appears more swollen than the right. No calf tenderness.   Skin:    General: Skin is warm and dry.     Findings: No rash.  Neurological:     Mental Status: He is alert.     Comments: Clear speech.   Psychiatric:        Behavior: Behavior normal.    ED Results / Procedures / Treatments   Labs (all labs ordered are listed, but only abnormal results are displayed) Labs Reviewed  RESP PANEL BY RT-PCR (FLU A&B, COVID) ARPGX2  COMPREHENSIVE METABOLIC PANEL  CBC WITH DIFFERENTIAL/PLATELET  BRAIN NATRIURETIC PEPTIDE  I-STAT VENOUS BLOOD GAS, ED  TROPONIN I (HIGH SENSITIVITY)    EKG None  Radiology DG Chest Portable 1 View  Result Date: 07/06/2020 CLINICAL DATA:  Shortness of breath and dyspnea. EXAM: PORTABLE CHEST 1 VIEW COMPARISON:  04/25/2017 FINDINGS: Low volume film. There is pulmonary vascular congestion without overt pulmonary edema. The cardio pericardial silhouette is enlarged. No focal airspace consolidation or substantial pleural effusion. Telemetry leads overlie the chest. IMPRESSION: Low volume film with pulmonary vascular congestion. Electronically Signed   By: Misty Stanley M.D.   On: 07/06/2020 10:27    Procedures Procedures   Medications Ordered in ED Medications  furosemide (LASIX) injection 80 mg (80 mg Intravenous Given 07/06/20 1245)  potassium chloride SA (KLOR-CON) CR tablet 20 mEq (20 mEq Oral Given 07/06/20 1245)    ED Course  I have reviewed the triage vital signs and the nursing notes.  Pertinent labs & imaging results that were available during my care of the patient were reviewed by me and considered in my medical decision making (see chart for details).   MDM Rules/Calculators/A&P                          Patient presents to the ED with complaints of dyspnea.  On CPAP with EMS, transitioned to bipap on arrival, will monitor and attempt to transition off as  patient looks well on BIPAP. No wheezing. Decreased breath sounds. LE edema- L lower leg appears more swollen than right.   DDX: CHF exacerbation, atypical ACS, DVT/PE, pneumonia, pneumothorax, COVID, COPD exacerbation (Felt to be less likely with no wheezing on exam).   EKG: RBBB, no STEMI.  Additional history obtained:  Additional history obtained from chart review & nursing note review.   Lab Tests:  I Ordered, reviewed, and interpreted labs, which included:  CBC: Unremarkable.  CMP: no significant derangement. Mild t bili/alk phos elevation. LFTs WNL.  Troponin: mild elevation @ 30 VBG overall reassuring.   Imaging Studies ordered:  I ordered imaging studies which included CXR & LLE venous duplex, I independently reviewed, formal radiology impression shows:  CXR: Low volume film with pulmonary vascular congestion LLE venous duplex- negative for DVT.   ED Course:  11:30: Patient off of bipap, saturating well on 4L via [place], transitioned to 2L via [place]. BP 1301/72.   LLE venous duplex negative for DVT- feel that PE is less likely, patient is anticoagulated. COVID negative. CXR wo infiltrate to suggest pneumonia, also no findings of PTX. CXR does have findings of pulmonary  vascular congestion and patient has elevated BNP, feel that CHF is likely etiology to his sxs, plan for diuresis & re-assessment.   13:00: Patient saturating well on room air, currently diuresing. Initially EMS had stated he took demadex & lasix, did receive 80 mg of IV lasix in the ED, however patient now informs me that he only takes demadex right now, however over past few days he has not been urinating as frequently with this.   Patient has diuresed well in the ED- put out 2000 cc of urine per nursing staff, ambulatory SpO2 maintained 92% or greater, he states he feels much better.   Discussed with Dr. Shan Levans with heart failure at home care team- he has seen patient in the ED relays appropriate for discharge, send  home with IV in place and heart failure at home team will continue to administer diuretics at home/manage his diuresis and set up close follow up.  Appreciate consultation/care.   I discussed results, treatment plan, need for follow-up, and return precautions with the patient. Provided opportunity for questions, patient confirmed understanding and is in agreement with plan.   Findings and plan of care discussed with supervising physician Dr. Roslynn Amble who has evaluated the patient & is in agreement.   Portions of this note were generated with [employer]. Dictation errors may occur despite best attempts at proofreading.  Final Clinical Impression(s) / ED Diagnoses Final diagnoses:  Acute on chronic congestive heart failure, unspecified heart failure type [facility])    Rx / DC Orders ED Discharge Orders     None        Amaryllis Dyke, PA-C 07/06/20 1547    Lucrezia Starch, MD 07/08/20 0900

## 2020-07-06 NOTE — ED Triage Notes (Signed)
Shob worsening over the last few days. Increased use of inhaler. COPD CHF hx. Pt with recent cardiac arrest.  88% RA, placed on NRB without much improvement. Placed on CPAP. Pt with chest pressure and dry cough.  150/80 HR 70 96% CPAP

## 2020-07-06 NOTE — Progress Notes (Signed)
VASCULAR LAB    Left lower extremity venous duplex has been performed.  See CV proc for preliminary results.  Gave verbal results to [facility], PA-C  Celita Aron, RVT 07/06/2020, 12:21 PM

## 2020-07-06 NOTE — ED Notes (Signed)
Patient Alert and oriented to baseline. Stable and ambulatory to baseline. Patient verbalized understanding of the discharge instructions.  Patient belongings were taken by the patient.   

## 2020-07-06 NOTE — Discharge Instructions (Addendum)
You were seen in the emergency department today for trouble breathing.  You were given IV diuretics to help get some fluid off.  Our heart failure team has seen you in the emergency department and are planning to set up continued care at home.  We have provided Dr. Maudie Flakes office information, his office will set up close follow-up.  In the interim return to the emergency department for any new or worsening symptoms including but not limited to new or worsening pain, increase trouble breathing, coughing up blood, fever, passing out, dizziness, or any other concerns.

## 2020-07-07 ENCOUNTER — Ambulatory Visit: Payer: BC Managed Care – PPO | Admitting: Podiatry

## 2020-07-07 DIAGNOSIS — I5022 Chronic systolic (congestive) heart failure: Secondary | ICD-10-CM | POA: Diagnosis not present

## 2020-07-08 DIAGNOSIS — I5022 Chronic systolic (congestive) heart failure: Secondary | ICD-10-CM | POA: Diagnosis not present

## 2020-07-13 ENCOUNTER — Other Ambulatory Visit: Payer: Self-pay | Admitting: Cardiology

## 2020-07-20 ENCOUNTER — Encounter: Payer: Self-pay | Admitting: Internal Medicine

## 2020-07-20 ENCOUNTER — Other Ambulatory Visit: Payer: Self-pay

## 2020-07-20 ENCOUNTER — Ambulatory Visit (INDEPENDENT_AMBULATORY_CARE_PROVIDER_SITE_OTHER): Payer: Medicare Other | Admitting: Podiatry

## 2020-07-20 ENCOUNTER — Telehealth (HOSPITAL_COMMUNITY): Payer: Self-pay | Admitting: Vascular Surgery

## 2020-07-20 DIAGNOSIS — M2042 Other hammer toe(s) (acquired), left foot: Secondary | ICD-10-CM | POA: Diagnosis not present

## 2020-07-20 DIAGNOSIS — L97522 Non-pressure chronic ulcer of other part of left foot with fat layer exposed: Secondary | ICD-10-CM | POA: Diagnosis not present

## 2020-07-20 NOTE — Telephone Encounter (Signed)
Called pt to make TOC appt , VM is full will try back later

## 2020-07-21 ENCOUNTER — Telehealth (HOSPITAL_COMMUNITY): Payer: Self-pay | Admitting: Vascular Surgery

## 2020-07-21 NOTE — Telephone Encounter (Signed)
2nd attempt, to reach pt, left pt Vm to contact office to make TOC appt

## 2020-07-22 ENCOUNTER — Emergency Department (HOSPITAL_COMMUNITY)
Admission: EM | Admit: 2020-07-22 | Discharge: 2020-07-22 | Disposition: A | Discharge status: Home / Self Care | Payer: Medicare Other | Attending: Emergency Medicine | Admitting: Emergency Medicine

## 2020-07-22 ENCOUNTER — Emergency Department (HOSPITAL_COMMUNITY): Payer: Medicare Other

## 2020-07-22 ENCOUNTER — Encounter: Payer: Self-pay | Admitting: Internal Medicine

## 2020-07-22 DIAGNOSIS — E119 Type 2 diabetes mellitus without complications: Secondary | ICD-10-CM | POA: Diagnosis not present

## 2020-07-22 DIAGNOSIS — Z87891 Personal history of nicotine dependence: Secondary | ICD-10-CM | POA: Diagnosis not present

## 2020-07-22 DIAGNOSIS — Z7901 Long term (current) use of anticoagulants: Secondary | ICD-10-CM | POA: Diagnosis not present

## 2020-07-22 DIAGNOSIS — Z79899 Other long term (current) drug therapy: Secondary | ICD-10-CM | POA: Diagnosis not present

## 2020-07-22 DIAGNOSIS — I5023 Acute on chronic systolic (congestive) heart failure: Secondary | ICD-10-CM | POA: Insufficient documentation

## 2020-07-22 DIAGNOSIS — Z888 Allergy status to other drugs, medicaments and biological substances status: Secondary | ICD-10-CM | POA: Insufficient documentation

## 2020-07-22 DIAGNOSIS — I4891 Unspecified atrial fibrillation: Secondary | ICD-10-CM | POA: Diagnosis not present

## 2020-07-22 DIAGNOSIS — I482 Chronic atrial fibrillation, unspecified: Secondary | ICD-10-CM

## 2020-07-22 DIAGNOSIS — I110 Hypertensive heart disease with heart failure: Secondary | ICD-10-CM | POA: Diagnosis not present

## 2020-07-22 DIAGNOSIS — Z91040 Latex allergy status: Secondary | ICD-10-CM | POA: Insufficient documentation

## 2020-07-22 DIAGNOSIS — I517 Cardiomegaly: Secondary | ICD-10-CM | POA: Diagnosis not present

## 2020-07-22 DIAGNOSIS — I2510 Atherosclerotic heart disease of native coronary artery without angina pectoris: Secondary | ICD-10-CM | POA: Diagnosis not present

## 2020-07-22 DIAGNOSIS — Z8616 Personal history of COVID-19: Secondary | ICD-10-CM | POA: Diagnosis not present

## 2020-07-22 DIAGNOSIS — Z20822 Contact with and (suspected) exposure to covid-19: Secondary | ICD-10-CM | POA: Diagnosis not present

## 2020-07-22 DIAGNOSIS — R1111 Vomiting without nausea: Secondary | ICD-10-CM | POA: Diagnosis not present

## 2020-07-22 DIAGNOSIS — R609 Edema, unspecified: Secondary | ICD-10-CM | POA: Diagnosis not present

## 2020-07-22 DIAGNOSIS — R197 Diarrhea, unspecified: Secondary | ICD-10-CM | POA: Diagnosis not present

## 2020-07-22 DIAGNOSIS — R0602 Shortness of breath: Secondary | ICD-10-CM | POA: Diagnosis not present

## 2020-07-22 LAB — BASIC METABOLIC PANEL
Anion gap: 11 (ref 5–15)
BUN: 21 mg/dL (ref 8–23)
CO2: 22 mmol/L (ref 22–32)
Calcium: 9.1 mg/dL (ref 8.9–10.3)
Chloride: 102 mmol/L (ref 98–111)
Creatinine, Ser: 1.11 mg/dL (ref 0.61–1.24)
GFR, Estimated: >60 mL/min (ref >60)
Glucose, Bld: 126 mg/dL — ABNORMAL HIGH (ref 70–99)
Potassium: 3.8 mmol/L (ref 3.5–5.1)
Sodium: 135 mmol/L (ref 135–145)

## 2020-07-22 LAB — RESP PANEL BY RT-PCR (FLU A&B, COVID) ARPGX2
Influenza A by PCR: NEGATIVE
Influenza B by PCR: NEGATIVE
SARS Coronavirus 2 by RT PCR: NEGATIVE

## 2020-07-22 LAB — CBC WITH DIFFERENTIAL/PLATELET
Abs Immature Granulocytes: 0.13 10*3/uL — ABNORMAL HIGH (ref 0.00–0.07)
Basophils Absolute: 0.1 10*3/uL (ref 0.0–0.1)
Basophils Relative: 1 %
Eosinophils Absolute: 0 10*3/uL (ref 0.0–0.5)
Eosinophils Relative: 0 %
HCT: 42 % (ref 39.0–52.0)
Hemoglobin: 13.7 g/dL (ref 13.0–17.0)
Immature Granulocytes: 1 %
Lymphocytes Relative: 6 %
Lymphs Abs: 0.8 10*3/uL (ref 0.7–4.0)
MCH: 32.2 pg (ref 26.0–34.0)
MCHC: 32.6 g/dL (ref 30.0–36.0)
MCV: 98.6 fL (ref 80.0–100.0)
Monocytes Absolute: 1.5 10*3/uL — ABNORMAL HIGH (ref 0.1–1.0)
Monocytes Relative: 11 %
Neutro Abs: 11.6 10*3/uL — ABNORMAL HIGH (ref 1.7–7.7)
Neutrophils Relative %: 81 %
Platelets: 157 10*3/uL (ref 150–400)
RBC: 4.26 MIL/uL (ref 4.22–5.81)
RDW: 15.3 % (ref 11.5–15.5)
WBC: 14.1 10*3/uL — ABNORMAL HIGH (ref 4.0–10.5)
nRBC: 0 % (ref 0.0–0.2)

## 2020-07-22 LAB — TROPONIN I (HIGH SENSITIVITY)
Troponin I (High Sensitivity): 37 ng/L — ABNORMAL HIGH (ref <18)
Troponin I (High Sensitivity): 39 ng/L — ABNORMAL HIGH (ref <18)

## 2020-07-22 LAB — BRAIN NATRIURETIC PEPTIDE: B Natriuretic Peptide: 818.2 pg/mL — ABNORMAL HIGH (ref 0.0–100.0)

## 2020-07-22 MED ORDER — POTASSIUM CHLORIDE 10 MEQ/100ML IV SOLN
10.0000 meq | Freq: Once | INTRAVENOUS | Status: AC
  Administered 2020-07-22: 10 meq via INTRAVENOUS
  Filled 2020-07-22: qty 100

## 2020-07-22 MED ORDER — FUROSEMIDE 10 MG/ML IJ SOLN
40.0000 mg | Freq: Once | INTRAMUSCULAR | Status: AC
  Administered 2020-07-22: 40 mg via INTRAVENOUS
  Filled 2020-07-22: qty 4

## 2020-07-22 MED ORDER — FUROSEMIDE 10 MG/ML IJ SOLN
80.0000 mg | Freq: Once | INTRAMUSCULAR | Status: AC
  Administered 2020-07-22: 80 mg via INTRAVENOUS
  Filled 2020-07-22: qty 8

## 2020-07-22 MED ORDER — FUROSEMIDE 10 MG/ML IJ SOLN: 40.0000 mg | Freq: Once | INTRAMUSCULAR | Status: DC

## 2020-07-22 MED ORDER — ONDANSETRON HCL 4 MG/2ML IJ SOLN
4.0000 mg | Freq: Once | INTRAMUSCULAR | Status: AC
  Administered 2020-07-22: 4 mg via INTRAVENOUS
  Filled 2020-07-22: qty 2

## 2020-07-22 NOTE — ED Provider Notes (Signed)
Assumed care of patient at 7 AM.  Has ready been given a dose of Lasix for volume overload.  History of the same.  Recently was here for heart failure symptoms.  Was discharged to heart failure at home program.  Seems that he failed to follow-up outpatient after getting several doses of IV Lasix outpatient.  Lab work appears to be consistent with prior evaluation.  Chest x-ray consistent with volume overload.  Patient tachypneic on exam was evaluated by Dr. Shan Levans with heart failure at home program who overall recommended that we give an additional 80 mg of IV Lasix and reevaluate.  Overall we will make sure decision with patient about whether or not he wants to go home or be admitted.  He appears still tachypneic but is starting to put out a good amount of urine.  Dr. Shan Levans agrees that he could be an observation stay versus discharge home but will reevaluate after next dose of Lasix.  Patient was given additional dose of Lasix after talking with Dr. Shan Levans.  He had large urinary output response from that.  Overall shared decision was made to discharge him back to home with heart failure team at home program.  Discharged in good condition.  This chart was dictated using voice recognition software.  Despite best efforts to proofread,  errors can occur which can change the documentation meaning.    Lennice Sites, DO 07/22/20 1258

## 2020-07-22 NOTE — ED Triage Notes (Signed)
Pt bib gems c/o sob onset yesterday. Pt denies chest pain. Endorses N/V. Hx of chf, copd, afib. Pt currently on diuretics with no changes to medication.   Spo2: 95% RA BP: 150/80  HR: 60-80  CBG: 142

## 2020-07-22 NOTE — ED Provider Notes (Signed)
[facility] EMERGENCY DEPARTMENT Provider Note   CSN: [id number] Arrival date & time: 07/22/20  0546     History Chief Complaint  Patient presents with   Shortness of Breath    [name] is a 65 y.o. male.  The history is provided by the patient.  Shortness of Breath He has history of hypertension, diabetes, atrial fibrillation anticoagulated on apixaban, systolic heart failure and comes in because of progressive edema and shortness of breath over the last week.  He had been seen in the emergency department 2 weeks ago with heart failure exacerbation, and started having increased swelling about 1 week ago.  Last night, he started having difficulty breathing which was worse if he lay flat.  He also had some vomiting and diarrhea last night which have resolved.  He denies chest pain, heaviness, tightness, pressure.  He has been compliant with his torsemide which she takes 20 mg once a day.  He states that he has gained 8 pounds since he was discharged from the emergency department 2 weeks ago.   Past Medical History:  Diagnosis Date   Acute systolic HF (heart failure) (HCC)    Arthritis    lt foot, hips knees    Atrial fibrillation ([place])    Diabetes mellitus, new onset ([place])    Diverticulosis    Dyslipidemia    Gout attack 01/16/2012   Hypertension    Internal hemorrhoids    Lymphoma ([place])    NHL dx'd 01/2010   Pneumonia due to COVID-19 virus 07/2018   Pulmonary hypertension ([place])    Sepsis ([place]) 02/2017   secondary to influenza; requiring trach   Tubular adenoma of colon    Venous stasis ulcers ([place])     Patient Active Problem List   Diagnosis Date Noted   Chronic systolic HF (heart failure) ([place]) 07/21/2019   Cough 07/12/2019   Educated about COVID-19 virus infection 05/12/2019   Non-pressure chronic ulcer of other part of left foot with unspecified severity ([place]) 04/11/2019   Pain in limb 04/11/2019   Adverse effect of other vaccines and biological  substances, initial encounter 04/10/2019   COVID-19 virus infection 08/07/2018   Recurrent pneumonia 02/26/2018   Port-A-Cath in place 01/08/2018   History of colonic polyps 12/11/2017   Thrombophilia ([place]) 10/30/2017   Heart failure ([place]) 07/04/2017   Dyspnea on exertion 06/19/2017   Chronic anticoagulation 05/22/2017   Cardiomyopathy ([place]) 05/22/2017   Hypotension 05/22/2017   RBBB 05/22/2017   UTI due to extended-spectrum beta lactamase (ESBL) producing Escherichia coli 04/06/2017   Debility    Wound infection    OSA (obstructive sleep apnea)    Acute systolic heart failure (HCC)    Morbid obesity ([place])    Diabetes mellitus, new onset ([place])    Dysuria    Acute respiratory distress syndrome (ARDS) (HCC)    Influenza A with pneumonia    New onset atrial fibrillation (HCC)    Anxiety    Current moderate episode of major depressive disorder without prior episode (HCC)    Obesity, Class III, BMI 40-49.9 (morbid obesity) ([place])    Community acquired pneumonia    Critical illness myopathy    Dysphagia    Non-Hodgkin's lymphoma ([place])    Benign essential HTN    Leukocytosis    Hypokalemia    Tracheostomy status ([place])    Hypoxic    Pressure injury of skin 02/28/2017   Status post tracheostomy ([place])    Follicular lymphoma grade II  of intrathoracic lymph nodes ([place]) 05/02/2016   Portacath in place 05/04/2015   Localized, primary osteoarthritis of hand 09/08/2014   Atherosclerotic heart disease of native coronary artery without angina pectoris 08/26/2013   Tobacco user 07/30/2012   Gout attack 01/16/2012   Psoriasis 14/48/1856   Follicular lymphoma grade II ([place]) 12/16/2010   Personal history of non-Hodgkin lymphomas 09/20/2010   Peripheral venous insufficiency 11/16/2009   Encounter for general adult medical examination without abnormal findings 06/16/2009   Hyperlipidemia 06/16/2009   Impaired fasting glucose 06/16/2009   Proteinuria 06/16/2009   Varicose veins of other  specified sites 06/16/2009    Past Surgical History:  Procedure Laterality Date   AMPUTATION TOE Left 05/08/2019   Procedure: AMPUTATION LEFT GREAT  TOE;  Surgeon: Trula Slade, DPM;  Location: [facility];  Service: Podiatry;  Laterality: Left;   BRONCHOSCOPY     ESOPHAGOGASTRODUODENOSCOPY ENDOSCOPY     Dr Royden Purl HERNIA REPAIR Left    2001   IR REMOVAL TUN ACCESS W/ PORT W/O FL MOD SED  02/03/2020   PORTA CATH INSERTION  2011   TRACHEOSTOMY  03/2017   with decannulation       Family History  Problem Relation Age of Onset   Dementia Brother 57       frontal lobe    Dementia Mother    Arthritis Mother    Hypertension Mother    CVA Sister 4   Colon cancer Neg Hx    Pancreatic cancer Neg Hx    Stomach cancer Neg Hx    Rectal cancer Neg Hx    Liver cancer Neg Hx     Social History   Tobacco Use   Smoking status: Former    Packs/day: 0.50    Years: 20.00    Pack years: 10.00    Types: Cigarettes    Quit date: 01/10/2009    Years since quitting: 11.5   Smokeless tobacco: Never  Vaping Use   Vaping Use: Never used  Substance Use Topics   Alcohol use: Yes    Comment: 2-3 times per week   Drug use: No    Home Medications Prior to Admission medications   Medication Sig Start Date End Date Taking? Authorizing Provider  acetaminophen (TYLENOL) 325 MG tablet Take 650 mg by mouth every 6 (six) hours as needed for moderate pain.     [provider]  albuterol (PROVENTIL HFA;VENTOLIN HFA) 108 (90 Base) MCG/ACT inhaler Inhale 2 puffs into the lungs every 6 (six) hours as needed for wheezing or shortness of breath. 04/05/17   Love, Ivan Anchors, PA-C  allopurinol (ZYLOPRIM) 300 MG tablet Take 300 mg by mouth daily. 12/03/18   [provider]  apixaban (ELIQUIS) 5 MG TABS tablet TAKE 1 TABLET BY MOUTH TWICE A DAY Patient taking differently: Take 5 mg by mouth 2 (two) times daily. 06/24/20   Minus Breeding, MD  atorvastatin (LIPITOR) 20 MG tablet TAKE 1  TABLET BY MOUTH EVERY DAY *PT OVERDUE FOR OFFICE VISIT* Patient not taking: No sig reported 04/02/18   Minus Breeding, MD  becaplermin (REGRANEX) 0.01 % gel Apply 1 application topically daily. Measurements right 1st toe 1.6 x 0.6 x 0.1cm 10/10/18   Trula Slade, DPM  carvedilol (COREG) 12.5 MG tablet Take 1 tablet (12.5 mg total) by mouth 2 (two) times daily. 03/09/20   Minus Breeding, MD  carvedilol (COREG) 6.25 MG tablet Take 1 tablet (6.25 mg total) by mouth 2 (two) times daily.  Take with Carvedilol 12.5mg  by mouth twice a day. 03/09/20   Minus Breeding, MD  collagenase (SANTYL) ointment Apply 1 application topically daily. Measurements to right hallux 2  X 1 x 0.1cm Patient not taking: No sig reported 02/18/19   Trula Slade, DPM  collagenase (SANTYL) ointment Apply 1 application topically daily. 04/27/20   Trula Slade, DPM  [employer] DIGESTIVE PROBIOTIC 250 MG capsule Take 500 mg by mouth daily. 08/03/18   [provider]  KLOR-CON M20 20 MEQ tablet TAKE 1 TABLET BY MOUTH EVERY DAY Patient taking differently: Take 20 mEq by mouth daily. 02/10/20   Minus Breeding, MD  losartan (COZAAR) 50 MG tablet TAKE 1 TABLET BY MOUTH EVERY MORNING AND AT BEDTIME 07/13/20   Minus Breeding, MD  Multiple Vitamin (MULTIVITAMIN WITH MINERALS) TABS tablet Take 1 tablet by mouth daily. 04/04/17   Love, Ivan Anchors, PA-C  mupirocin ointment (BACTROBAN) 2 % Apply 1 application topically 2 (two) times daily. 07/30/18   Trula Slade, DPM  Omega 3 340 MG CPDR Take 1 tablet by mouth 2 (two) times daily. 06/16/09   [provider]  omeprazole (PRILOSEC) 40 MG capsule Take 40 mg by mouth daily.  10/15/18   [provider]  sertraline (ZOLOFT) 50 MG tablet Take 1 tablet (50 mg total) by mouth daily. 04/05/17   Love, Ivan Anchors, PA-C  silver sulfADIAZINE (SILVADENE) 1 % cream Apply 1 application topically daily. 09/02/19   Trula Slade, DPM  sodium chloride (OCEAN) 0.65 % SOLN nasal spray  Place 2 sprays into both nostrils as needed for congestion. 04/05/17   Love, Ivan Anchors, PA-C  torsemide (DEMADEX) 20 MG tablet TAKE 1 TABLET BY MOUTH EVERY DAY Patient taking differently: Take 20 mg by mouth daily. 02/04/20   Minus Breeding, MD  vitamin C (VITAMIN C) 500 MG tablet Take 1 tablet (500 mg total) by mouth 3 (three) times daily. 04/05/17   Love, Ivan Anchors, PA-C    Allergies    Latex and Penicillins  Review of Systems   Review of Systems  Respiratory:  Positive for shortness of breath.   All other systems reviewed and are negative.  Physical Exam Updated Vital Signs BP (!) 149/78   Pulse 88   Temp 99.2 F (37.3 C) (Oral)   Resp 16   Ht 5\' 11"  (1.803 m)   Wt (!) 156.9 kg   SpO2 99%   BMI 48.26 kg/m   Physical Exam Vitals and nursing note reviewed.  Morbidly obese 65year old male, resting comfortably and in no acute distress. Vital signs are significant for elevated blood pressure. Oxygen saturation is 99%, which is normal. Head is normocephalic and atraumatic. PERRLA, EOMI. Oropharynx is clear. Neck is nontender and supple without adenopathy or JVD. Back is nontender and there is no CVA tenderness. Lungs are clear without rales, wheezes, or rhonchi. Chest is nontender. Heart has regular rate and rhythm without murmur. Abdomen is soft, flat, nontender without masses or hepatosplenomegaly and peristalsis is normoactive. Extremities have 2-3+ edema, full range of motion is present. Skin is warm and dry without rash. Neurologic: Mental status is normal, cranial nerves are intact, there are no motor or sensory deficits.  ED Results / Procedures / Treatments   Labs (all labs ordered are listed, but only abnormal results are displayed) Labs Reviewed  CBC WITH DIFFERENTIAL/PLATELET - Abnormal; Notable for the following components:      Result Value   WBC 14.1 (*)    Neutro  Abs 11.6 (*)    Monocytes Absolute 1.5 (*)    Abs Immature Granulocytes 0.13 (*)    All other  components within normal limits  BRAIN NATRIURETIC PEPTIDE  BASIC METABOLIC PANEL  TROPONIN I (HIGH SENSITIVITY)    EKG EKG Interpretation  Date/Time:  Wednesday July 22 2020 05:52:11 EDT Ventricular Rate:  85 PR Interval:    QRS Duration: 191 QT Interval:  444 QTC Calculation: 528 R Axis:   -89 Text Interpretation: Atrial fibrillation Right bundle branch block When compared with ECG of 07/06/2020, No significant change was found Confirmed by Delora Fuel ([id number]) on 07/22/2020 5:59:01 AM  Radiology DG Chest Port 1 View  Result Date: 07/22/2020 CLINICAL DATA:  65 year old male with history of shortness of breath. EXAM: PORTABLE CHEST 1 VIEW COMPARISON:  Chest x-ray 07/06/2020. FINDINGS: Low lung volumes. Diffuse interstitial prominence and peribronchial cuffing, markedly worsened compared to the prior study. No confluent consolidative airspace disease. No pleural effusions. No pneumothorax. No evidence of pulmonary edema. Heart size is mildly enlarged. Upper mediastinal contours are within normal limits. IMPRESSION: 1. Worsening diffuse interstitial prominence and peribronchial cuffing, concerning for severe acute bronchitis. 2. Mild cardiomegaly. Electronically Signed   By: Vinnie Langton M.D.   On: 07/22/2020 06:24    Procedures Procedures   Medications Ordered in ED Medications  furosemide (LASIX) injection 40 mg (has no administration in time range)    ED Course  I have reviewed the triage vital signs and the nursing notes.  Pertinent labs & imaging results that were available during my care of the patient were reviewed by me and considered in my medical decision making (see chart for details).   MDM Rules/Calculators/A&P                         Shortness of breath which appears to be a heart failure exacerbation.  Will check chest x-ray, screening labs including BNP.  He is clearly fluid overloaded, and is given a dose of intravenous furosemide.  Old records are reviewed  confirming ED visit on 6/27 for heart failure exacerbation.  Prior echocardiograms have shown decreased systolic function with ejection fraction of 45%, indeterminate diastolic parameters.  Chest x-ray is read by radiologist as acute bronchitis with cardiomegaly.  I reviewed the image and feel it is more consistent with heart failure.  CBC is come back with slightly elevated WBC.  Case is signed out to Dr. Ronnald Nian.  Final Clinical Impression(s) / ED Diagnoses Final diagnoses:  Acute on chronic systolic (congestive) heart failure (HCC)  Chronic atrial fibrillation (HCC)  Chronic anticoagulation    Rx / DC Orders ED Discharge Orders     None        Delora Fuel, MD 52/84/13 [phone]

## 2020-07-22 NOTE — ED Notes (Signed)
Informed RN of heart monitor reading.

## 2020-07-23 NOTE — Progress Notes (Signed)
Subjective: 65 year old male presents the office today for follow-up evaluation of a wound on the second digit of the left foot.  He states that since last appointment he feels the wound is doing better.  He did go on vacation he feels that the salt water pool did help.  Denies any fevers, chills, nausea.  Denies any chest pain, shortness of breath or calf pain.   Objective: AAO x3, NAD-he presents today wearing surgical shoe. DP/PT pulses palpable bilaterally, CRT less than 3 seconds Amputation site previously has healed.  Ulceration still present on the dorsal aspect the second and third toes which appears to be about the same in appearance compared to last appointment there is granulation tissue present.  Minimal fibrotic tissue.  There is no exposed bone or tendon.  Small amount of clear drainage.  There is no purulence.  There is no fluctuation crepitation.  There is no malodor.  Rigid hammertoe present second digit. No pain with calf compression, swelling, warmth, erythema  Assessment: Ulceration second/third digit   Plan: -All treatment options discussed with the patient including all alternatives, risks, complications.  -Overall the wound is doing about the same.  There is no significant tissue debris today is more granular.  I do think that some of his wound has started since he has not been wearing compression socks and the edema.  However we had to stop using this because it was causing irritation of the skin as it was causing moisture and initially started the wound.  When to go back to using a compression sock to help.  I also applied an absorptive bandage today and would order Maxorb AG for him as well which I ordered today through [employer] -Encouraged elevation. -Surgical shoe for offloading. -Monitor for any clinical signs or symptoms of infection and directed to call the office immediately should any occur or go to the ER.  *X-ray next appointment, if no improvement consider  grafting versus referral to the wound care center.   Trula Slade DPM

## 2020-07-24 ENCOUNTER — Telehealth (HOSPITAL_COMMUNITY): Payer: Self-pay | Admitting: Licensed Clinical Social Worker

## 2020-07-24 DIAGNOSIS — I5022 Chronic systolic (congestive) heart failure: Secondary | ICD-10-CM | POA: Diagnosis not present

## 2020-07-24 NOTE — Telephone Encounter (Signed)
CSW contacted patient to remind of [facility] appointment on Monday July 27, 2020. Patient confirmed appointment. Raquel Sarna, [place], [place]

## 2020-07-27 ENCOUNTER — Ambulatory Visit (HOSPITAL_COMMUNITY)
Admission: RE | Admit: 2020-07-27 | Discharge: 2020-07-27 | Disposition: A | Discharge status: Home / Self Care | Payer: Medicare Other | Source: Ambulatory Visit | Attending: Internal Medicine | Admitting: Internal Medicine

## 2020-07-27 ENCOUNTER — Encounter (HOSPITAL_COMMUNITY): Payer: Self-pay

## 2020-07-27 ENCOUNTER — Other Ambulatory Visit: Payer: Self-pay

## 2020-07-27 VITALS — BP 102/78 | HR 56 | Wt 339.4 lb

## 2020-07-27 DIAGNOSIS — I4821 Permanent atrial fibrillation: Secondary | ICD-10-CM

## 2020-07-27 DIAGNOSIS — Z7901 Long term (current) use of anticoagulants: Secondary | ICD-10-CM | POA: Insufficient documentation

## 2020-07-27 DIAGNOSIS — I5022 Chronic systolic (congestive) heart failure: Secondary | ICD-10-CM

## 2020-07-27 DIAGNOSIS — I428 Other cardiomyopathies: Secondary | ICD-10-CM | POA: Insufficient documentation

## 2020-07-27 DIAGNOSIS — Z87891 Personal history of nicotine dependence: Secondary | ICD-10-CM | POA: Insufficient documentation

## 2020-07-27 DIAGNOSIS — R001 Bradycardia, unspecified: Secondary | ICD-10-CM | POA: Diagnosis not present

## 2020-07-27 DIAGNOSIS — R42 Dizziness and giddiness: Secondary | ICD-10-CM | POA: Diagnosis not present

## 2020-07-27 DIAGNOSIS — Z8249 Family history of ischemic heart disease and other diseases of the circulatory system: Secondary | ICD-10-CM | POA: Diagnosis not present

## 2020-07-27 DIAGNOSIS — E669 Obesity, unspecified: Secondary | ICD-10-CM | POA: Insufficient documentation

## 2020-07-27 DIAGNOSIS — I959 Hypotension, unspecified: Secondary | ICD-10-CM | POA: Insufficient documentation

## 2020-07-27 DIAGNOSIS — G4733 Obstructive sleep apnea (adult) (pediatric): Secondary | ICD-10-CM | POA: Insufficient documentation

## 2020-07-27 DIAGNOSIS — I110 Hypertensive heart disease with heart failure: Secondary | ICD-10-CM | POA: Insufficient documentation

## 2020-07-27 DIAGNOSIS — Z8616 Personal history of COVID-19: Secondary | ICD-10-CM | POA: Diagnosis not present

## 2020-07-27 DIAGNOSIS — Z79899 Other long term (current) drug therapy: Secondary | ICD-10-CM | POA: Insufficient documentation

## 2020-07-27 LAB — BASIC METABOLIC PANEL
Anion gap: 9 (ref 5–15)
BUN: 28 mg/dL — ABNORMAL HIGH (ref 8–23)
CO2: 24 mmol/L (ref 22–32)
Calcium: 9 mg/dL (ref 8.9–10.3)
Chloride: 100 mmol/L (ref 98–111)
Creatinine, Ser: 1.08 mg/dL (ref 0.61–1.24)
GFR, Estimated: >60 mL/min (ref >60)
Glucose, Bld: 94 mg/dL (ref 70–99)
Potassium: 3.9 mmol/L (ref 3.5–5.1)
Sodium: 133 mmol/L — ABNORMAL LOW (ref 135–145)

## 2020-07-27 LAB — BRAIN NATRIURETIC PEPTIDE: B Natriuretic Peptide: 381.6 pg/mL — ABNORMAL HIGH (ref 0.0–100.0)

## 2020-07-27 MED ORDER — TORSEMIDE 20 MG PO TABS: ORAL_TABLET | ORAL | 1 refills | Status: DC

## 2020-07-27 NOTE — Patient Instructions (Signed)
EKG done today.  Labs done today. We will contact you only if your labs are abnormal.  DECREASE Carvedilol to 12.5mg  (1 tablet) by mouth 2 times daily.  CONTINUE TORSEMIDE 40MG  (2 tablets) BY MOUTH DAILY FOR 3 DAYS THEN START TAKING 40MG  (2 TABLETS) BY MOUTH DAILY ALTERNATING WITH 20MG  (1 tablet) BY MOUTH DAILY.  No other medication changes were made. Please continue all current medications as prescribed.  Your physician recommends that you schedule a follow-up appointment in: 1 week  If you have any questions or concerns before your next appointment please send [place] a message through [place] or call our office at [phone].    TO LEAVE A MESSAGE FOR THE NURSE SELECT OPTION 2, PLEASE LEAVE A MESSAGE INCLUDING: YOUR NAME DATE OF BIRTH CALL BACK NUMBER REASON FOR CALL**this is important as we prioritize the call backs  YOU WILL RECEIVE A CALL BACK THE SAME DAY AS LONG AS YOU CALL BEFORE 4:00 PM   Do the following things EVERYDAY: Weigh yourself in the morning before breakfast. Write it down and keep it in a log. Take your medicines as prescribed Eat low salt foods--Limit salt (sodium) to 2000 mg per day.  Stay as active as you can everyday Limit all fluids for the day to less than 2 liters   At the [facility], you and your health needs are our priority. As part of our continuing mission to provide you with exceptional heart care, we have created designated Provider Care Teams. These Care Teams include your primary Cardiologist (physician) and Advanced Practice Providers (APPs- Physician Assistants and Nurse Practitioners) who all work together to provide you with the care you need, when you need it.   You may see any of the following providers on your designated Care Team at your next follow up: Dr Glori Bickers Dr Haynes Kerns, NP Lyda Jester, [place] Audry Riles, PharmD   Please be sure to bring in all your medications bottles to every  appointment. '

## 2020-07-27 NOTE — Progress Notes (Signed)
[facility]  PCP: [name] Primary Cardiologist: Minus Breeding  HPI:  [name] is a 65 y.o.  male  with a PMH significant for chronic HFrEF, permanent rate controlled afib, OSA, obesity, chronic venous stasis, NH lymphoma  NHL diagnosed 2012, completed R-CHOP chemotherapy  02/2017 admitted for acute respiratory failure.  Endorsed 3 weeks of worsening SOB.  He arrived hypoxic in the 80's, placed on BiPAP, treated with nebs and solumedrol.  Additionally, found to be in new onset Afib w/RVR.  In ER, labs noted for Na 132, alk phos 185, AST 115, ALT 66, BNP 251, troponin 0.08, lactate 1.96 increasing to 2, WBC 2.9 (prior 7.8 in 10/2016), platelets 91k.  Chest xray was consistent with multifocal pna.  He was started on broad spectrum abx.  Respiratory panel returned pos for infulenza.  He developed ARDS secondary to influenza pneumonia which resulted in intubation. Patient failed extubation resulting ultimately requiring tracheostomy. Over a month patient had aggressive weaning trials which finally resulted in him being decannulated.  He had an echo which demonstrated moderate LVH, EF 25-30%, diffuse hypokinesis.   Followed by Dr. Percival Spanish after hospitalization.  He was continued on losartan and carvedilol.  OSA treated with cpap.  Had a repeat ECHO 07/2017 with EF 50-55%.    Continued outpatient follow up with Dr. Percival Spanish, doing well echo repeated 11/2018 with EF 45-50%.    Recently admitted 06/2020 into the heart failure at home program after presenting to [facility] ED with s/s of volume overload, elevated BNP around 900, stable cr. Ecg without acute ischemia and flat troponin. Chest x-ray with pulmonary edema.  He responded quickly and robustly to IV diuretic therapy with improvement in symptoms.  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 Unfortunately never followed up in [facility] clinic  outpatient.  Several weeks later seen again in [facility] ED with a very similar presentation.  Once again enrolled in heart failure at home program and given IV diuretics with good response.    Weight 334lbs at home in general before about 348lbs.  Had some low heart rate and light headedness Saturday morning.  Saturday and Sunday started taking 49m torsemide daily. Reports good output with torsemide.  Weight 334 has decreased from 341 on Saturday.  Denies chest pain.  Sleeping well no orthopnea and using cpap.  No PND.  Doing a better job of watching salt intake.  Still has shortness of breath with exertion   ROS: All systems negative except as listed in HPI, PMH and Problem List.  SH:  Social History   Socioeconomic History   Marital status: Divorced    Spouse name: Not on file   Number of children: Not on file   Years of education: Not on file   Highest education level: Not on file  Occupational History   Occupation: cosmetologist  Tobacco Use   Smoking status: Former    Packs/day: 0.50    Years: 20.00    Pack years: 10.00    Types: Cigarettes    Quit date: 01/10/2009    Years since quitting: 11.5   Smokeless tobacco: Never  Vaping Use   Vaping Use: Never used  Substance and Sexual Activity   Alcohol use: Yes    Comment: 2-3 times per week   Drug use: No   Sexual activity: Not on file  Other Topics Concern   Not on file  Social History Narrative   Not on file   Social Determinants of Health   Financial Resource Strain: Not on file  Food Insecurity: Not on file  Transportation Needs: Not on file  Physical Activity: Not on file  Stress: Not on file  Social Connections: Not on file  Intimate Partner Violence: Not on file    FH:  Family History  Problem Relation Age of Onset   Dementia Brother 50       frontal lobe    Dementia Mother    Arthritis Mother    Hypertension Mother    CVA Sister 44   Colon cancer Neg Hx    Pancreatic cancer Neg Hx    Stomach cancer Neg Hx     Rectal cancer Neg Hx    Liver cancer Neg Hx     Past Medical History:  Diagnosis Date   Acute systolic HF (heart failure) ([place])    Arthritis    lt foot, hips knees    Atrial fibrillation ([place])    Diabetes mellitus, new onset ([place])    Diverticulosis    Dyslipidemia    Gout attack 01/16/2012   Hypertension    Internal hemorrhoids    Lymphoma ([place])    NHL dx'd 01/2010   Pneumonia due to COVID-19 virus 07/2018   Pulmonary hypertension ([place])    Sepsis ([place]) 02/2017   secondary to influenza; requiring trach   Tubular adenoma of colon    Venous stasis ulcers ([facility])     Current Outpatient Medications  Medication Sig Dispense Refill   acetaminophen (TYLENOL) 325 MG tablet Take 650 mg by mouth every 6 (six) hours as needed for moderate pain.      albuterol (PROVENTIL HFA;VENTOLIN HFA) 108 (90 Base) MCG/ACT inhaler Inhale 2 puffs into the lungs every 6 (six) hours as needed for wheezing or shortness of breath. 1 Inhaler 2   allopurinol (ZYLOPRIM) 300 MG tablet Take 300 mg by mouth daily.     apixaban (ELIQUIS) 5 MG TABS tablet TAKE 1 TABLET BY MOUTH TWICE A DAY (Patient taking differently: Take 5 mg by mouth 2 (two) times daily.) 180 tablet 1   atorvastatin (LIPITOR) 20 MG tablet TAKE 1 TABLET BY MOUTH EVERY DAY *PT OVERDUE FOR OFFICE VISIT* (Patient taking differently: Take 20 mg by mouth daily.) 30 tablet 5   becaplermin (REGRANEX) 0.01 % gel Apply 1 application topically daily. Measurements right 1st toe 1.6 x 0.6 x 0.1cm 15 g 5   carvedilol (COREG) 12.5 MG tablet Take 1 tablet (12.5 mg total) by mouth 2 (two) times daily. 180 tablet 3   carvedilol (COREG) 6.25 MG tablet Take 1 tablet (6.25 mg total) by mouth 2 (two) times daily. Take with Carvedilol 12.86m by mouth twice a day. 180 tablet 3   cholecalciferol (VITAMIN D) 25 MCG (1000 UNIT) tablet Take 1,000 Units by mouth daily.     collagenase (SANTYL) ointment Apply 1 application topically daily. Measurements to right hallux 2  X 1 x  0.1cm (Patient taking differently: Apply 1 application topically daily. Measurements to right hallux 2  X 1 x 0.1cm) 30 g 5   [employer] DIGESTIVE PROBIOTIC 250 MG capsule Take 500 mg by mouth daily.     KLOR-CON M20 20 MEQ tablet TAKE 1 TABLET BY MOUTH EVERY DAY (Patient taking differently: Take 20 mEq by mouth daily.) 90 tablet 1   losartan (COZAAR) 50 MG tablet TAKE 1 TABLET BY MOUTH EVERY MORNING AND AT BEDTIME (Patient taking differently: Take  50 mg by mouth in the morning and at bedtime.) 180 tablet 2   Multiple Vitamin (MULTIVITAMIN WITH MINERALS) TABS tablet Take 1 tablet by mouth daily.     mupirocin ointment (BACTROBAN) 2 % Apply 1 application topically 2 (two) times daily. 30 g 2   Omega 3 340 MG CPDR Take 1 tablet by mouth 2 (two) times daily.     omeprazole (PRILOSEC) 40 MG capsule Take 40 mg by mouth daily.      sertraline (ZOLOFT) 50 MG tablet Take 1 tablet (50 mg total) by mouth daily. 30 tablet 0   silver sulfADIAZINE (SILVADENE) 1 % cream Apply 1 application topically daily. 50 g 2   sodium chloride (OCEAN) 0.65 % SOLN nasal spray Place 2 sprays into both nostrils as needed for congestion.  0   torsemide (DEMADEX) 20 MG tablet TAKE 1 TABLET BY MOUTH EVERY DAY (Patient taking differently: Take 20 mg by mouth daily.) 90 tablet 1   vitamin B-12 (CYANOCOBALAMIN) 1000 MCG tablet Take 1,000 mcg by mouth daily.     vitamin C (VITAMIN C) 500 MG tablet Take 1 tablet (500 mg total) by mouth 3 (three) times daily. (Patient taking differently: Take 500 mg by mouth 2 (two) times daily.)     No current facility-administered medications for this encounter.   Facility-Administered Medications Ordered in Other Encounters  Medication Dose Route Frequency Provider Last Rate Last Admin   sodium chloride 0.9 % injection 10 mL  10 mL Intravenous PRN Curt Bears, MD   10 mL at 05/04/15 1529    Vitals:   07/27/20 1505  BP: 102/78  Pulse: (!) 56  SpO2: 98%  Weight: (!) 154 kg (339 lb 6.4 oz)     PHYSICAL EXAM: Cardiac: JVD difficult to assess,  normal rate and rhythm, clear s1 and s2, no murmurs, rubs or gallops, 1+ LE edema left, 2+ right (chronic lymphedema) Pulmonary: improved aeration of left base, decreased sounds right base, not in distress Abdominal: non distended abdomen, soft and nontender Psych: Alert, conversant, in good spirits  POCUS -IVC dilated with <50% resp variability -JVP 10cm   ECG   Afib rate 60, RBBB  ASSESSMENT & PLAN:  Chronic HFrEF: -NICM diagnosed during 02/2017 admission for ARDS influenza also newly diagnosed afib EF 25-30% at that time -ECHO 07/2017 with EF 50-55% recovered with medical therapy  -EF remained relatively stable since then, last ECHO 11/2018 EF 45-50% -Bedside ECHO in ED EF remained about 45-50% 06/2020 -NYHA Class III symptoms with improvement, volume status closer to euvolemic but remains overloaded -decrease carvedilol slightly due to symptomatic bradycardia and hypotension to 12.35m BID from 18.75 BID -Continue losartan 531m-avoid SGLT2i due to ESBL UTI 2019 -Continue increased torsemide 40 daily x 3 days then alternate 4062mhen 50m68mery other day -will need repeat formal echo will order this next visit when he is more well compensated  Permanent afib: -rate controlled, on eliquis -decrease carvedilol as above  OSA: -uses cpap nightly  Obesity: -discussed portion control and need for weight loss   Follow up one week with me

## 2020-07-28 DIAGNOSIS — I4821 Permanent atrial fibrillation: Secondary | ICD-10-CM | POA: Insufficient documentation

## 2020-08-03 ENCOUNTER — Ambulatory Visit (INDEPENDENT_AMBULATORY_CARE_PROVIDER_SITE_OTHER): Payer: Medicare Other | Admitting: Podiatry

## 2020-08-03 ENCOUNTER — Other Ambulatory Visit: Payer: Self-pay

## 2020-08-03 ENCOUNTER — Inpatient Hospital Stay (HOSPITAL_COMMUNITY)
Admission: EM | Admit: 2020-08-03 | Discharge: 2020-08-11 | DRG: 240 | Disposition: A | Discharge status: Rehabilitation Facility | Payer: Medicare Other | Attending: Internal Medicine | Admitting: Internal Medicine

## 2020-08-03 ENCOUNTER — Emergency Department (HOSPITAL_COMMUNITY): Payer: Medicare Other

## 2020-08-03 ENCOUNTER — Encounter (HOSPITAL_COMMUNITY): Payer: Self-pay | Admitting: Emergency Medicine

## 2020-08-03 ENCOUNTER — Ambulatory Visit (INDEPENDENT_AMBULATORY_CARE_PROVIDER_SITE_OTHER): Payer: Medicare Other

## 2020-08-03 ENCOUNTER — Encounter: Payer: Self-pay | Admitting: Podiatry

## 2020-08-03 ENCOUNTER — Telehealth: Payer: Self-pay | Admitting: Sports Medicine

## 2020-08-03 VITALS — Temp 98.1°F

## 2020-08-03 DIAGNOSIS — Z6841 Body Mass Index (BMI) 40.0 and over, adult: Secondary | ICD-10-CM

## 2020-08-03 DIAGNOSIS — Z8616 Personal history of COVID-19: Secondary | ICD-10-CM

## 2020-08-03 DIAGNOSIS — L89320 Pressure ulcer of left buttock, unstageable: Secondary | ICD-10-CM | POA: Diagnosis present

## 2020-08-03 DIAGNOSIS — Z8261 Family history of arthritis: Secondary | ICD-10-CM

## 2020-08-03 DIAGNOSIS — S93145A Subluxation of metatarsophalangeal joint of left lesser toe(s), initial encounter: Secondary | ICD-10-CM | POA: Diagnosis not present

## 2020-08-03 DIAGNOSIS — M7989 Other specified soft tissue disorders: Secondary | ICD-10-CM | POA: Diagnosis not present

## 2020-08-03 DIAGNOSIS — L03116 Cellulitis of left lower limb: Secondary | ICD-10-CM | POA: Diagnosis present

## 2020-08-03 DIAGNOSIS — G4733 Obstructive sleep apnea (adult) (pediatric): Secondary | ICD-10-CM | POA: Diagnosis present

## 2020-08-03 DIAGNOSIS — E119 Type 2 diabetes mellitus without complications: Secondary | ICD-10-CM

## 2020-08-03 DIAGNOSIS — Z823 Family history of stroke: Secondary | ICD-10-CM

## 2020-08-03 DIAGNOSIS — I96 Gangrene, not elsewhere classified: Secondary | ICD-10-CM

## 2020-08-03 DIAGNOSIS — L97522 Non-pressure chronic ulcer of other part of left foot with fat layer exposed: Secondary | ICD-10-CM

## 2020-08-03 DIAGNOSIS — E11621 Type 2 diabetes mellitus with foot ulcer: Secondary | ICD-10-CM | POA: Diagnosis present

## 2020-08-03 DIAGNOSIS — Z79899 Other long term (current) drug therapy: Secondary | ICD-10-CM

## 2020-08-03 DIAGNOSIS — C8210 Follicular lymphoma grade II, unspecified site: Secondary | ICD-10-CM | POA: Diagnosis present

## 2020-08-03 DIAGNOSIS — I890 Lymphedema, not elsewhere classified: Secondary | ICD-10-CM | POA: Diagnosis present

## 2020-08-03 DIAGNOSIS — I272 Pulmonary hypertension, unspecified: Secondary | ICD-10-CM | POA: Diagnosis present

## 2020-08-03 DIAGNOSIS — M86172 Other acute osteomyelitis, left ankle and foot: Secondary | ICD-10-CM | POA: Diagnosis not present

## 2020-08-03 DIAGNOSIS — Z8249 Family history of ischemic heart disease and other diseases of the circulatory system: Secondary | ICD-10-CM

## 2020-08-03 DIAGNOSIS — L03032 Cellulitis of left toe: Secondary | ICD-10-CM | POA: Diagnosis present

## 2020-08-03 DIAGNOSIS — L97526 Non-pressure chronic ulcer of other part of left foot with bone involvement without evidence of necrosis: Secondary | ICD-10-CM | POA: Diagnosis present

## 2020-08-03 DIAGNOSIS — E11628 Type 2 diabetes mellitus with other skin complications: Secondary | ICD-10-CM | POA: Diagnosis present

## 2020-08-03 DIAGNOSIS — Z7901 Long term (current) use of anticoagulants: Secondary | ICD-10-CM

## 2020-08-03 DIAGNOSIS — I4891 Unspecified atrial fibrillation: Secondary | ICD-10-CM | POA: Diagnosis present

## 2020-08-03 DIAGNOSIS — Z872 Personal history of diseases of the skin and subcutaneous tissue: Secondary | ICD-10-CM | POA: Diagnosis not present

## 2020-08-03 DIAGNOSIS — I110 Hypertensive heart disease with heart failure: Secondary | ICD-10-CM | POA: Diagnosis present

## 2020-08-03 DIAGNOSIS — Z9889 Other specified postprocedural states: Secondary | ICD-10-CM

## 2020-08-03 DIAGNOSIS — E1169 Type 2 diabetes mellitus with other specified complication: Secondary | ICD-10-CM | POA: Diagnosis present

## 2020-08-03 DIAGNOSIS — Z91040 Latex allergy status: Secondary | ICD-10-CM

## 2020-08-03 DIAGNOSIS — M170 Bilateral primary osteoarthritis of knee: Secondary | ICD-10-CM | POA: Diagnosis present

## 2020-08-03 DIAGNOSIS — I5022 Chronic systolic (congestive) heart failure: Secondary | ICD-10-CM | POA: Diagnosis present

## 2020-08-03 DIAGNOSIS — E785 Hyperlipidemia, unspecified: Secondary | ICD-10-CM | POA: Diagnosis present

## 2020-08-03 DIAGNOSIS — E1152 Type 2 diabetes mellitus with diabetic peripheral angiopathy with gangrene: Secondary | ICD-10-CM | POA: Diagnosis not present

## 2020-08-03 DIAGNOSIS — Z880 Allergy status to penicillin: Secondary | ICD-10-CM

## 2020-08-03 DIAGNOSIS — Z89412 Acquired absence of left great toe: Secondary | ICD-10-CM

## 2020-08-03 DIAGNOSIS — C8212 Follicular lymphoma grade II, intrathoracic lymph nodes: Secondary | ICD-10-CM | POA: Diagnosis present

## 2020-08-03 DIAGNOSIS — Z87891 Personal history of nicotine dependence: Secondary | ICD-10-CM

## 2020-08-03 DIAGNOSIS — M109 Gout, unspecified: Secondary | ICD-10-CM | POA: Diagnosis present

## 2020-08-03 DIAGNOSIS — I4821 Permanent atrial fibrillation: Secondary | ICD-10-CM | POA: Diagnosis present

## 2020-08-03 LAB — CBC WITH DIFFERENTIAL/PLATELET
Abs Immature Granulocytes: 0.07 10*3/uL (ref 0.00–0.07)
Basophils Absolute: 0.1 10*3/uL (ref 0.0–0.1)
Basophils Relative: 1 %
Eosinophils Absolute: 0.1 10*3/uL (ref 0.0–0.5)
Eosinophils Relative: 1 %
HCT: 42 % (ref 39.0–52.0)
Hemoglobin: 13.6 g/dL (ref 13.0–17.0)
Immature Granulocytes: 1 %
Lymphocytes Relative: 13 %
Lymphs Abs: 1.3 10*3/uL (ref 0.7–4.0)
MCH: 31.4 pg (ref 26.0–34.0)
MCHC: 32.4 g/dL (ref 30.0–36.0)
MCV: 97 fL (ref 80.0–100.0)
Monocytes Absolute: 0.8 10*3/uL (ref 0.1–1.0)
Monocytes Relative: 8 %
Neutro Abs: 8.2 10*3/uL — ABNORMAL HIGH (ref 1.7–7.7)
Neutrophils Relative %: 76 %
Platelets: 270 10*3/uL (ref 150–400)
RBC: 4.33 MIL/uL (ref 4.22–5.81)
RDW: 14.8 % (ref 11.5–15.5)
WBC: 10.6 10*3/uL — ABNORMAL HIGH (ref 4.0–10.5)
nRBC: 0 % (ref 0.0–0.2)

## 2020-08-03 LAB — COMPREHENSIVE METABOLIC PANEL
ALT: 21 U/L (ref 0–44)
AST: 22 U/L (ref 15–41)
Albumin: 3.2 g/dL — ABNORMAL LOW (ref 3.5–5.0)
Alkaline Phosphatase: 134 U/L — ABNORMAL HIGH (ref 38–126)
Anion gap: 10 (ref 5–15)
BUN: 26 mg/dL — ABNORMAL HIGH (ref 8–23)
CO2: 23 mmol/L (ref 22–32)
Calcium: 9.1 mg/dL (ref 8.9–10.3)
Chloride: 101 mmol/L (ref 98–111)
Creatinine, Ser: 1.08 mg/dL (ref 0.61–1.24)
GFR, Estimated: >60 mL/min (ref >60)
Glucose, Bld: 109 mg/dL — ABNORMAL HIGH (ref 70–99)
Potassium: 3.8 mmol/L (ref 3.5–5.1)
Sodium: 134 mmol/L — ABNORMAL LOW (ref 135–145)
Total Bilirubin: 1.4 mg/dL — ABNORMAL HIGH (ref 0.3–1.2)
Total Protein: 7.6 g/dL (ref 6.5–8.1)

## 2020-08-03 LAB — LACTIC ACID, PLASMA: Lactic Acid, Venous: 1 mmol/L (ref 0.5–1.9)

## 2020-08-03 NOTE — Telephone Encounter (Signed)
Patient's friend Jenny Reichmann call answering service stating that there was a 9 hour wait in the ED and Ruger can't sit that long. I advised Jenny Reichmann that the wait can be long depending on how busy the ER is. I told her that once Alando makes it through triage he will be worked up by ER physician and then further evaluated for admission. I advised her NOT to take him back home or leave the ER because if they come back tomorrow the wait could be even longer or his infection may worsen. Jenny Reichmann thanked me for calling her back and stated that she was going to check on Yordin now. I told her that she can ask the receptionist in the ER for updates while she waits. She again thanked me for calling her back and I advised her if she has any other ?s to call the answering service back.  -Dr. Cannon Kettle

## 2020-08-03 NOTE — ED Triage Notes (Signed)
Patient reports worsening chronic ( 2 years) diabetic wound infection of left great and 2nd toe with swelling and drainage , denies fever or chills .

## 2020-08-03 NOTE — ED Provider Notes (Signed)
Emergency Medicine Provider Triage Evaluation Note  [name] , a 64 y.o. male  was evaluated in triage.  Pt complains of foot infection.  Patient was seen by podiatry today, was sent to the emergency department today for worsening foot infection.  Patient does have big toe which appears necrotic, states that its been worsening over the past week.  Patient stated have been complications with his foot for over a year now.  Was sent to the ED for IV antibiotics for podiatry.   Review of Systems  Positive: Foot infection Negative: fevers  Physical Exam  BP 115/89   Pulse 74   Temp 98.9 F (37.2 C) (Oral)   Resp 18   SpO2 96%  Gen:   Awake, no distress  Resp:  Normal effort  MSK:   Left foot with big toe and second and third digits with erythema and foul-smelling odor.  First toe appears slightly necrotic.  See pictures in chart from podiatry today.  DP pulses 2+ on Doppler.   Medical Decision Making  Medically screening exam initiated at 8:23 PM.  Appropriate orders placed.  ALEIX KARMEL was informed that the remainder of the evaluation will be completed by another provider, this initial triage assessment does not replace that evaluation, and the importance of remaining in the ED until their evaluation is complete.     Alfredia Client, PA-C 08/03/20 2026    Lennice Sites, DO 08/03/20 2324

## 2020-08-03 NOTE — Progress Notes (Signed)
Subjective: 65 year old male presents the office today for follow-up evaluation of a wound on the second digit of the left foot.  He states that last week he started to have increased pain to the foot as well as an odor coming from his foot.  Also since I saw him last he has been in the emergency department for shortness of breath, fluid accumulation.  This is his second ER visit for this he has a follow-up scheduled with his primary care physician in 2 weeks.  Currently denies any fevers or chills.  Objective: AAO x3, NAD-he presents today wearing surgical shoe. DP/PT pulses palpable bilaterally, CRT delayed to the left 2nd toe Amputation site previously has healed.  Significant worsening of the wound particularly on his left second toe.  There is necrotic tissue present on the second toe with purulent drainage.  There is a wound on the plantar aspect the toe with exposed bone.  Superficial granular the dorsal aspect of the toe as well as the third digit.  There is increased edema to the foot but also to the leg.  Erythema noted.  No fluctuation. No pain with calf compression, swelling, warmth, erythema       Assessment: Ulceration second/third digit -significant worsening  Plan: -All treatment options discussed with the patient including all alternatives, risks, complications.  -X-rays obtained and reviewed.  Likely osteomyelitis of the second digit and counseled concerning for the third digit.  Digital deformities noted.  Edema noted. -At this point given the significant worsening wounds the last saw him as well as the odor and pain I recommended him to go to the emergency department for admission IV antibiotics.  Also been having some fluid accumulation issues.  Upon admission when he identifies, MRI of left foot and repeat arterial studies given significant worsening of the wound in the short time.  She does have palpable pulses but would still recommend arterial studies.  Vascular consult  if abnormal.  Podiatry will follow.  Trula Slade DPM

## 2020-08-04 ENCOUNTER — Telehealth: Payer: Self-pay | Admitting: Podiatry

## 2020-08-04 ENCOUNTER — Telehealth: Payer: Self-pay | Admitting: *Deleted

## 2020-08-04 ENCOUNTER — Inpatient Hospital Stay (HOSPITAL_COMMUNITY): Payer: Medicare Other

## 2020-08-04 ENCOUNTER — Encounter (HOSPITAL_COMMUNITY): Payer: Self-pay | Admitting: Internal Medicine

## 2020-08-04 DIAGNOSIS — I110 Hypertensive heart disease with heart failure: Secondary | ICD-10-CM | POA: Diagnosis not present

## 2020-08-04 DIAGNOSIS — E1152 Type 2 diabetes mellitus with diabetic peripheral angiopathy with gangrene: Secondary | ICD-10-CM | POA: Diagnosis not present

## 2020-08-04 DIAGNOSIS — Z8249 Family history of ischemic heart disease and other diseases of the circulatory system: Secondary | ICD-10-CM | POA: Diagnosis not present

## 2020-08-04 DIAGNOSIS — M86172 Other acute osteomyelitis, left ankle and foot: Secondary | ICD-10-CM | POA: Diagnosis not present

## 2020-08-04 DIAGNOSIS — I739 Peripheral vascular disease, unspecified: Secondary | ICD-10-CM | POA: Diagnosis not present

## 2020-08-04 DIAGNOSIS — I96 Gangrene, not elsewhere classified: Secondary | ICD-10-CM | POA: Diagnosis present

## 2020-08-04 DIAGNOSIS — M19072 Primary osteoarthritis, left ankle and foot: Secondary | ICD-10-CM | POA: Diagnosis not present

## 2020-08-04 DIAGNOSIS — Z8261 Family history of arthritis: Secondary | ICD-10-CM | POA: Diagnosis not present

## 2020-08-04 DIAGNOSIS — E11628 Type 2 diabetes mellitus with other skin complications: Secondary | ICD-10-CM | POA: Diagnosis present

## 2020-08-04 DIAGNOSIS — Z823 Family history of stroke: Secondary | ICD-10-CM | POA: Diagnosis not present

## 2020-08-04 DIAGNOSIS — M86679 Other chronic osteomyelitis, unspecified ankle and foot: Secondary | ICD-10-CM

## 2020-08-04 DIAGNOSIS — E1142 Type 2 diabetes mellitus with diabetic polyneuropathy: Secondary | ICD-10-CM | POA: Diagnosis not present

## 2020-08-04 DIAGNOSIS — L03116 Cellulitis of left lower limb: Secondary | ICD-10-CM | POA: Diagnosis present

## 2020-08-04 DIAGNOSIS — E669 Obesity, unspecified: Secondary | ICD-10-CM | POA: Diagnosis not present

## 2020-08-04 DIAGNOSIS — C8212 Follicular lymphoma grade II, intrathoracic lymph nodes: Secondary | ICD-10-CM | POA: Diagnosis present

## 2020-08-04 DIAGNOSIS — L89322 Pressure ulcer of left buttock, stage 2: Secondary | ICD-10-CM | POA: Diagnosis not present

## 2020-08-04 DIAGNOSIS — Z87828 Personal history of other (healed) physical injury and trauma: Secondary | ICD-10-CM | POA: Diagnosis not present

## 2020-08-04 DIAGNOSIS — R5381 Other malaise: Secondary | ICD-10-CM | POA: Diagnosis not present

## 2020-08-04 DIAGNOSIS — E11621 Type 2 diabetes mellitus with foot ulcer: Secondary | ICD-10-CM | POA: Diagnosis not present

## 2020-08-04 DIAGNOSIS — Z6841 Body Mass Index (BMI) 40.0 and over, adult: Secondary | ICD-10-CM | POA: Diagnosis not present

## 2020-08-04 DIAGNOSIS — Z4781 Encounter for orthopedic aftercare following surgical amputation: Secondary | ICD-10-CM | POA: Diagnosis not present

## 2020-08-04 DIAGNOSIS — Z7901 Long term (current) use of anticoagulants: Secondary | ICD-10-CM | POA: Diagnosis not present

## 2020-08-04 DIAGNOSIS — L97519 Non-pressure chronic ulcer of other part of right foot with unspecified severity: Secondary | ICD-10-CM | POA: Diagnosis not present

## 2020-08-04 DIAGNOSIS — M86672 Other chronic osteomyelitis, left ankle and foot: Secondary | ICD-10-CM | POA: Diagnosis not present

## 2020-08-04 DIAGNOSIS — L97526 Non-pressure chronic ulcer of other part of left foot with bone involvement without evidence of necrosis: Secondary | ICD-10-CM | POA: Diagnosis present

## 2020-08-04 DIAGNOSIS — I5022 Chronic systolic (congestive) heart failure: Secondary | ICD-10-CM | POA: Diagnosis not present

## 2020-08-04 DIAGNOSIS — Z87891 Personal history of nicotine dependence: Secondary | ICD-10-CM | POA: Diagnosis not present

## 2020-08-04 DIAGNOSIS — I4821 Permanent atrial fibrillation: Secondary | ICD-10-CM | POA: Diagnosis not present

## 2020-08-04 DIAGNOSIS — E785 Hyperlipidemia, unspecified: Secondary | ICD-10-CM | POA: Diagnosis present

## 2020-08-04 DIAGNOSIS — M7989 Other specified soft tissue disorders: Secondary | ICD-10-CM | POA: Diagnosis not present

## 2020-08-04 DIAGNOSIS — Z89432 Acquired absence of left foot: Secondary | ICD-10-CM | POA: Diagnosis not present

## 2020-08-04 DIAGNOSIS — L89320 Pressure ulcer of left buttock, unstageable: Secondary | ICD-10-CM | POA: Diagnosis present

## 2020-08-04 DIAGNOSIS — L0390 Cellulitis, unspecified: Secondary | ICD-10-CM | POA: Diagnosis not present

## 2020-08-04 DIAGNOSIS — I272 Pulmonary hypertension, unspecified: Secondary | ICD-10-CM | POA: Diagnosis not present

## 2020-08-04 DIAGNOSIS — E1169 Type 2 diabetes mellitus with other specified complication: Secondary | ICD-10-CM | POA: Diagnosis not present

## 2020-08-04 DIAGNOSIS — Z89431 Acquired absence of right foot: Secondary | ICD-10-CM | POA: Diagnosis not present

## 2020-08-04 DIAGNOSIS — L03032 Cellulitis of left toe: Secondary | ICD-10-CM | POA: Diagnosis present

## 2020-08-04 DIAGNOSIS — I890 Lymphedema, not elsewhere classified: Secondary | ICD-10-CM | POA: Diagnosis present

## 2020-08-04 DIAGNOSIS — Z8616 Personal history of COVID-19: Secondary | ICD-10-CM | POA: Diagnosis not present

## 2020-08-04 DIAGNOSIS — Z8679 Personal history of other diseases of the circulatory system: Secondary | ICD-10-CM | POA: Diagnosis not present

## 2020-08-04 LAB — CBG MONITORING, ED: Glucose-Capillary: 109 mg/dL — ABNORMAL HIGH (ref 70–99)

## 2020-08-04 LAB — SEDIMENTATION RATE: Sed Rate: 40 mm/hr — ABNORMAL HIGH (ref 0–16)

## 2020-08-04 LAB — PREALBUMIN: Prealbumin: 10.6 mg/dL — ABNORMAL LOW (ref 18–38)

## 2020-08-04 LAB — GLUCOSE, CAPILLARY: Glucose-Capillary: 110 mg/dL — ABNORMAL HIGH (ref 70–99)

## 2020-08-04 LAB — C-REACTIVE PROTEIN: CRP: 10 mg/dL — ABNORMAL HIGH (ref <1.0)

## 2020-08-04 LAB — HIV ANTIBODY (ROUTINE TESTING W REFLEX): HIV Screen 4th Generation wRfx: NONREACTIVE

## 2020-08-04 MED ORDER — ACETAMINOPHEN 325 MG PO TABS
650.0000 mg | ORAL_TABLET | Freq: Four times a day (QID) | ORAL | Status: DC | PRN
  Administered 2020-08-07: 650 mg via ORAL
  Filled 2020-08-04: qty 2

## 2020-08-04 MED ORDER — BISACODYL 5 MG PO TBEC: 5.0000 mg | DELAYED_RELEASE_TABLET | Freq: Every day | ORAL | Status: DC | PRN

## 2020-08-04 MED ORDER — HEPARIN SODIUM (PORCINE) 5000 UNIT/ML IJ SOLN
5000.0000 [IU] | Freq: Three times a day (TID) | INTRAMUSCULAR | Status: AC
  Administered 2020-08-04 (×2): 5000 [IU] via SUBCUTANEOUS
  Filled 2020-08-04 (×2): qty 1

## 2020-08-04 MED ORDER — SACCHAROMYCES BOULARDII 250 MG PO CAPS
500.0000 mg | ORAL_CAPSULE | Freq: Every day | ORAL | Status: DC
  Administered 2020-08-04 – 2020-08-09 (×6): 500 mg via ORAL
  Filled 2020-08-04 (×6): qty 2

## 2020-08-04 MED ORDER — ONDANSETRON HCL 4 MG PO TABS: 4.0000 mg | ORAL_TABLET | Freq: Four times a day (QID) | ORAL | Status: DC | PRN

## 2020-08-04 MED ORDER — CARVEDILOL 12.5 MG PO TABS
12.5000 mg | ORAL_TABLET | Freq: Two times a day (BID) | ORAL | Status: DC
  Administered 2020-08-04 – 2020-08-11 (×12): 12.5 mg via ORAL
  Filled 2020-08-04 (×4): qty 1
  Filled 2020-08-04: qty 4
  Filled 2020-08-04 (×9): qty 1

## 2020-08-04 MED ORDER — MORPHINE SULFATE (PF) 2 MG/ML IV SOLN
2.0000 mg | INTRAVENOUS | Status: DC | PRN
  Administered 2020-08-05 – 2020-08-06 (×3): 2 mg via INTRAVENOUS
  Filled 2020-08-04 (×3): qty 1

## 2020-08-04 MED ORDER — LOSARTAN POTASSIUM 50 MG PO TABS
50.0000 mg | ORAL_TABLET | Freq: Two times a day (BID) | ORAL | Status: DC
  Administered 2020-08-04 – 2020-08-05 (×2): 50 mg via ORAL
  Filled 2020-08-04 (×2): qty 1

## 2020-08-04 MED ORDER — ACETAMINOPHEN 650 MG RE SUPP: 650.0000 mg | Freq: Four times a day (QID) | RECTAL | Status: DC | PRN

## 2020-08-04 MED ORDER — POLYETHYLENE GLYCOL 3350 17 G PO PACK
17.0000 g | PACK | Freq: Every day | ORAL | Status: DC | PRN
  Filled 2020-08-04: qty 1

## 2020-08-04 MED ORDER — ATORVASTATIN CALCIUM 10 MG PO TABS
20.0000 mg | ORAL_TABLET | Freq: Every day | ORAL | Status: DC
  Administered 2020-08-04 – 2020-08-11 (×8): 20 mg via ORAL
  Filled 2020-08-04 (×8): qty 2

## 2020-08-04 MED ORDER — METRONIDAZOLE 500 MG PO TABS
500.0000 mg | ORAL_TABLET | Freq: Three times a day (TID) | ORAL | Status: DC
  Administered 2020-08-04 – 2020-08-08 (×12): 500 mg via ORAL
  Filled 2020-08-04 (×12): qty 1

## 2020-08-04 MED ORDER — ALLOPURINOL 300 MG PO TABS
300.0000 mg | ORAL_TABLET | Freq: Every day | ORAL | Status: DC
  Administered 2020-08-04 – 2020-08-11 (×8): 300 mg via ORAL
  Filled 2020-08-04 (×6): qty 1
  Filled 2020-08-04: qty 3
  Filled 2020-08-04: qty 1

## 2020-08-04 MED ORDER — INSULIN ASPART 100 UNIT/ML IJ SOLN
0.0000 [IU] | Freq: Three times a day (TID) | INTRAMUSCULAR | Status: DC
  Administered 2020-08-07 – 2020-08-08 (×4): 2 [IU] via SUBCUTANEOUS

## 2020-08-04 MED ORDER — SODIUM CHLORIDE 0.9 % IV SOLN
2.0000 g | Freq: Three times a day (TID) | INTRAVENOUS | Status: DC
  Administered 2020-08-04 – 2020-08-06 (×6): 2 g via INTRAVENOUS
  Filled 2020-08-04 (×9): qty 2

## 2020-08-04 MED ORDER — VANCOMYCIN HCL 10 G IV SOLR
2500.0000 mg | Freq: Once | INTRAVENOUS | Status: AC
  Administered 2020-08-04: 2500 mg via INTRAVENOUS
  Filled 2020-08-04: qty 2500

## 2020-08-04 MED ORDER — HYDRALAZINE HCL 20 MG/ML IJ SOLN: 5.0000 mg | INTRAMUSCULAR | Status: DC | PRN

## 2020-08-04 MED ORDER — PANTOPRAZOLE SODIUM 40 MG PO TBEC
40.0000 mg | DELAYED_RELEASE_TABLET | Freq: Every day | ORAL | Status: DC
  Administered 2020-08-04 – 2020-08-11 (×8): 40 mg via ORAL
  Filled 2020-08-04 (×8): qty 1

## 2020-08-04 MED ORDER — HYDROCODONE-ACETAMINOPHEN 5-325 MG PO TABS
1.0000 | ORAL_TABLET | ORAL | Status: DC | PRN
  Administered 2020-08-05 – 2020-08-06 (×3): 2 via ORAL
  Administered 2020-08-06: 1 via ORAL
  Administered 2020-08-06: 2 via ORAL
  Filled 2020-08-04 (×6): qty 2

## 2020-08-04 MED ORDER — DOCUSATE SODIUM 100 MG PO CAPS
100.0000 mg | ORAL_CAPSULE | Freq: Two times a day (BID) | ORAL | Status: DC
  Administered 2020-08-04 – 2020-08-07 (×6): 100 mg via ORAL
  Filled 2020-08-04 (×13): qty 1

## 2020-08-04 MED ORDER — SODIUM CHLORIDE 0.9 % IV SOLN: INTRAVENOUS | Status: DC

## 2020-08-04 MED ORDER — SERTRALINE HCL 50 MG PO TABS
50.0000 mg | ORAL_TABLET | Freq: Every day | ORAL | Status: DC
  Administered 2020-08-04 – 2020-08-11 (×8): 50 mg via ORAL
  Filled 2020-08-04 (×8): qty 1

## 2020-08-04 MED ORDER — ONDANSETRON HCL 4 MG/2ML IJ SOLN: 4.0000 mg | Freq: Four times a day (QID) | INTRAMUSCULAR | Status: DC | PRN

## 2020-08-04 MED ORDER — HEPARIN SODIUM (PORCINE) 5000 UNIT/ML IJ SOLN
5000.0000 [IU] | Freq: Three times a day (TID) | INTRAMUSCULAR | Status: DC
  Administered 2020-08-05 – 2020-08-06 (×3): 5000 [IU] via SUBCUTANEOUS
  Filled 2020-08-04 (×3): qty 1

## 2020-08-04 MED ORDER — LORAZEPAM 2 MG/ML IJ SOLN
1.0000 mg | Freq: Once | INTRAMUSCULAR | Status: AC
  Administered 2020-08-04: 1 mg via INTRAVENOUS
  Filled 2020-08-04: qty 1

## 2020-08-04 MED ORDER — INSULIN ASPART 100 UNIT/ML IJ SOLN: 0.0000 [IU] | Freq: Every day | INTRAMUSCULAR | Status: DC

## 2020-08-04 MED ORDER — VANCOMYCIN HCL 1500 MG/300ML IV SOLN
1500.0000 mg | Freq: Two times a day (BID) | INTRAVENOUS | Status: DC
  Administered 2020-08-04 – 2020-08-10 (×13): 1500 mg via INTRAVENOUS
  Filled 2020-08-04 (×14): qty 300

## 2020-08-04 NOTE — Consult Note (Signed)
Podiatry Consult Note  To: Yates Reason for consult: Left foot infection  From: Cannon Kettle  HPI: [name] is a 65 y.o. male patient who seen at bedside for evaluation of left foot infection. Patient is well known to Dr. Jacqualyn Posey at [facility] who advised him to come to ER on yesterday for admission for left foot infection and gangrene to 2nd toe.  Patient admits that the infection progressed and he put in a time span of less than a week.  Patient denies any current symptoms bedside and ER and reports that he is anxious and ready to get to a room since he has been in the ER since yesterday.  Patient Active Problem List   Diagnosis Date Noted   Gangrene ([place]) 08/04/2020   Permanent atrial fibrillation ([place]) [id number]   Chronic systolic HF (heart failure) ([place]) 07/21/2019   Cough 07/12/2019   Educated about COVID-19 virus infection 05/12/2019   Non-pressure chronic ulcer of other part of left foot with unspecified severity ([place]) 04/11/2019   Pain in limb 04/11/2019   Adverse effect of other vaccines and biological substances, initial encounter 04/10/2019   COVID-19 virus infection 08/07/2018   Recurrent pneumonia 02/26/2018   Port-A-Cath in place 01/08/2018   History of colonic polyps 12/11/2017   Thrombophilia ([place]) 10/30/2017   Heart failure ([place]) 07/04/2017   Dyspnea on exertion 06/19/2017   Chronic anticoagulation 05/22/2017   Cardiomyopathy ([place]) 05/22/2017   Hypotension 05/22/2017   RBBB 05/22/2017   UTI due to extended-spectrum beta lactamase (ESBL) producing Escherichia coli 04/06/2017   Debility    Wound infection    OSA (obstructive sleep apnea)    Acute systolic heart failure ([place])    Morbid obesity ([place])    Diabetes mellitus, new onset ([place])    Dysuria    Acute respiratory distress syndrome (ARDS) ([facility])    Influenza A with pneumonia    Anxiety    Current moderate episode of major depressive disorder without prior episode ([place])    Obesity, Class III,  BMI 40-49.9 (morbid obesity) ([place])    Community acquired pneumonia    Critical illness myopathy    Dysphagia    Non-Hodgkin's lymphoma ([place])    Benign essential HTN    Leukocytosis    Hypokalemia    Tracheostomy status ([name])    Hypoxic    Pressure injury of skin 02/28/2017   Status post tracheostomy ([place])    Follicular lymphoma grade II of intrathoracic lymph nodes ([place]) 05/02/2016   Portacath in place 05/04/2015   Localized, primary osteoarthritis of hand 09/08/2014   Atherosclerotic heart disease of native coronary artery without angina pectoris 08/26/2013   Tobacco user 07/30/2012   Gout attack 01/16/2012   Psoriasis [id number]   Follicular lymphoma grade II ([place]) 12/16/2010   Personal history of non-Hodgkin lymphomas 09/20/2010   Peripheral venous insufficiency 11/16/2009   Encounter for general adult medical examination without abnormal findings 06/16/2009   Hyperlipidemia 06/16/2009   Impaired fasting glucose 06/16/2009   Proteinuria 06/16/2009   Varicose veins of other specified sites 06/16/2009    Current Facility-Administered Medications on File Prior to Encounter  Medication Dose Route Frequency Provider Last Rate Last Admin   sodium chloride 0.9 % injection 10 mL  10 mL Intravenous PRN Curt Bears, MD   10 mL at 05/04/15 1529   Current Outpatient Medications on File Prior to Encounter  Medication Sig Dispense Refill   acetaminophen (TYLENOL) 325 MG tablet Take 650 mg by mouth every  6 (six) hours as needed for moderate pain.      albuterol (PROVENTIL HFA;VENTOLIN HFA) 108 (90 Base) MCG/ACT inhaler Inhale 2 puffs into the lungs every 6 (six) hours as needed for wheezing or shortness of breath. 1 Inhaler 2   allopurinol (ZYLOPRIM) 300 MG tablet Take 300 mg by mouth daily.     apixaban (ELIQUIS) 5 MG TABS tablet TAKE 1 TABLET BY MOUTH TWICE A DAY (Patient taking differently: Take 5 mg by mouth 2 (two) times daily.) 180 tablet 1   atorvastatin (LIPITOR) 20 MG tablet  TAKE 1 TABLET BY MOUTH EVERY DAY *PT OVERDUE FOR OFFICE VISIT* (Patient taking differently: Take 20 mg by mouth daily.) 30 tablet 5   carvedilol (COREG) 12.5 MG tablet Take 1 tablet (12.5 mg total) by mouth 2 (two) times daily. 180 tablet 3   cholecalciferol (VITAMIN D) 25 MCG (1000 UNIT) tablet Take 1,000 Units by mouth daily.     collagenase (SANTYL) ointment Apply 1 application topically daily. Measurements to right hallux 2  X 1 x 0.1cm (Patient taking differently: Apply 1 application topically daily. Measurements to right hallux 2  X 1 x 0.1cm) 30 g 5   [employer] DIGESTIVE PROBIOTIC 250 MG capsule Take 500 mg by mouth daily.     KLOR-CON M20 20 MEQ tablet TAKE 1 TABLET BY MOUTH EVERY DAY (Patient taking differently: Take 40 mEq by mouth daily.) 90 tablet 1   losartan (COZAAR) 50 MG tablet TAKE 1 TABLET BY MOUTH EVERY MORNING AND AT BEDTIME (Patient taking differently: Take 50 mg by mouth in the morning and at bedtime.) 180 tablet 2   Multiple Vitamin (MULTIVITAMIN WITH MINERALS) TABS tablet Take 1 tablet by mouth daily.     Omega 3 340 MG CPDR Take 1 tablet by mouth 2 (two) times daily.     omeprazole (PRILOSEC) 40 MG capsule Take 40 mg by mouth daily.      sertraline (ZOLOFT) 50 MG tablet Take 1 tablet (50 mg total) by mouth daily. 30 tablet 0   torsemide (DEMADEX) 20 MG tablet Take 2 tablets by mouth daily alternating with 1 tablet by mouth daily. 90 tablet 1   vitamin B-12 (CYANOCOBALAMIN) 1000 MCG tablet Take 1,000 mcg by mouth daily.     vitamin C (VITAMIN C) 500 MG tablet Take 1 tablet (500 mg total) by mouth 3 (three) times daily. (Patient taking differently: Take 500 mg by mouth 2 (two) times daily.)     [DISCONTINUED] sodium chloride (OCEAN) 0.65 % SOLN nasal spray Place 2 sprays into both nostrils as needed for congestion. (Patient not taking: Reported on 08/04/2020)  0    Allergies  Allergen Reactions   Latex Hives and Swelling   Penicillins Swelling    Age 45 or 54 - pt states he had  swelling all over and his lips got real big    Past Surgical History:  Procedure Laterality Date   AMPUTATION TOE Left 05/08/2019   Procedure: AMPUTATION LEFT GREAT  TOE;  Surgeon: Trula Slade, DPM;  Location: [facility];  Service: Podiatry;  Laterality: Left;   BRONCHOSCOPY     ESOPHAGOGASTRODUODENOSCOPY ENDOSCOPY     Dr Royden Purl HERNIA REPAIR Left    2001   IR REMOVAL TUN ACCESS W/ PORT W/O FL MOD SED  02/03/2020   PORTA CATH INSERTION  2011   TRACHEOSTOMY  03/2017   with decannulation    Family History  Problem Relation Age of Onset   Dementia Brother  23       frontal lobe    Dementia Mother    Arthritis Mother    Hypertension Mother    CVA Sister 75   Colon cancer Neg Hx    Pancreatic cancer Neg Hx    Stomach cancer Neg Hx    Rectal cancer Neg Hx    Liver cancer Neg Hx     Social History   Socioeconomic History   Marital status: Divorced    Spouse name: Not on file   Number of children: Not on file   Years of education: Not on file   Highest education level: Not on file  Occupational History   Occupation: cosmetologist  Tobacco Use   Smoking status: Former    Packs/day: 0.50    Years: 20.00    Pack years: 10.00    Types: Cigarettes    Quit date: 01/10/2009    Years since quitting: 11.5   Smokeless tobacco: Never  Vaping Use   Vaping Use: Never used  Substance and Sexual Activity   Alcohol use: Not Currently    Comment: 2-3 times per week   Drug use: No   Sexual activity: Not on file  Other Topics Concern   Not on file  Social History Narrative   Not on file   Social Determinants of Health   Financial Resource Strain: Not on file  Food Insecurity: Not on file  Transportation Needs: Not on file  Physical Activity: Not on file  Stress: Not on file  Social Connections: Not on file  Intimate Partner Violence: Not on file     Objective:  Today's Vitals   08/04/20 1615 08/04/20 1630 08/04/20 1700 08/04/20 1715  BP: 121/86  115/76  128/84  Pulse: (!) 107 (!) 58 (!) 53 64  Resp: '17 17 19 19  '$ Temp:      TempSrc:      SpO2: 90% 98% 97% 100%  Weight:      Height:      PainSc:       Body mass index is 52.27 kg/m.   General: Alert and oriented x3 in no acute distress  Dermatology: Plantar left second toe ulcer probes to bone with significant soft tissue loss and necrotic distal tuft of the left second toe with significant soft tissue loss to the second and third toes with significant redness warmth swelling to the level of the midfoot with active purulent drainage present to the left foot.   Vascular: Dorsalis Pedis and Posterior Tibial pedal pulses faintly palpable, Capillary Fill Time unable to be detected at the left second toe.  1+ pitting edema entire lower extremity.  Neurology: Protective sensation absent on the left foot.  Musculoskeletal: No pain to palpation to the left foot there is significant 1+ pitting edema to the entire lower extremity with no pain with calf compression.   Results Xrays  Left foot   IMPRESSION: 1. No definite acute fracture. 2. Subluxation of the distal interphalangeal joint of the second toe, suboptimally evaluated on this radiograph. 3. Diffuse soft tissue swelling and subcutaneous edema.   MRI left foot IMPRESSION: 1. Findings most consistent with acute osteomyelitis of the second and third toes, as above. 2. Status post transmetatarsal amputation of the first ray. Bone marrow edema within the distal 1.9 cm of the residual first metatarsal may reflect reactive osteitis versus early acute osteomyelitis. 3. Prominent circumferential soft tissue swelling with dorsal subcutaneous edema. Probable soft tissue ulceration at the second toe along its  medial aspect. No organized fluid collection or abscess.     Results for orders placed or performed during the hospital encounter of 08/03/20  Blood culture (routine x 2)     Status: None (Preliminary result)   Collection  Time: 08/03/20  8:45 PM   Specimen: BLOOD  Result Value Ref Range Status   Specimen Description BLOOD LEFT ARM  Final   Special Requests   Final    BOTTLES DRAWN AEROBIC AND ANAEROBIC Blood Culture adequate volume   Culture   Final    NO GROWTH < 12 HOURS Performed at [facility], [place] [address], [place], [place] [address]    Report Status PENDING  Incomplete  Blood culture (routine x 2)     Status: None (Preliminary result)   Collection Time: 08/03/20  8:45 PM   Specimen: BLOOD  Result Value Ref Range Status   Specimen Description BLOOD LEFT ARM  Final   Special Requests   Final    BOTTLES DRAWN AEROBIC ONLY Blood Culture results may not be optimal due to an excessive volume of blood received in culture bottles   Culture   Final    NO GROWTH < 12 HOURS Performed at [facility], [place] [address] [place], [place] [address]    Report Status PENDING  Incomplete     Assessment and Plan: Problem List Items Addressed This Visit   None Visit Diagnoses     Gangrene of foot ([place])    -  Primary   Left        -Complete examination performed -Xrays reviewed -Discussed treatment options gangrene and osteomyelitis as reflected on MRI -Patient agreeable for surgical management. Consent obtained for left transmetatarsal amputation with Dr. Jacqualyn Posey. Pre and Post op course explained. Risks, benefits, alternatives explained. No guarantees given or implied.  -Orders in place for n.p.o. at midnight for the OR tomorrow -Recommend to follow-up ABIs -Recommend rest and elevation for pain and edema control -Recommend continue with medical management and IV antibiotics -Patient to be weightbearing to heel only for transfers -Consult appreciated -Podiatry to follow   Dr. Landis Martins, [place] and [place] [phone] office [phone] cell  Time spent with patient for exam and coordination of care:   28 mins

## 2020-08-04 NOTE — ED Notes (Signed)
Attempted to give reportx1 

## 2020-08-04 NOTE — Telephone Encounter (Signed)
Patient wife called and stated her husband has been sitting in the emergency room since 6:30 pm Yesterday. He has not been treated and he has not even been able to prop his foot up. Wife is very concerned and wanted to know what can be done. He has been sitting in his wheel chair all night long.  Please call wife Wann

## 2020-08-04 NOTE — Progress Notes (Signed)
Received call from ED. Recommended admission, start IV antibiotics, MRI ordered.   Dr. Cannon Kettle will be by to see the patient later today.

## 2020-08-04 NOTE — Telephone Encounter (Signed)
Patient wife called and stated her husband has been sitting in the emergency room since 6:30 pm Yesterday. He has not been treated and he has not even been able to prop his foot up. Wife is very concerned and wanted to know what can be done. He has been sitting in his wheel chair all night long.  Joycelyn Schmid, RN called the ER for an update: The ED is super backed up. The longest wait is 19hrs. [name] has  been there for 15hrs and he still has 6 people ahead of him to be seen.   I have called Jenny Reichmann and updated her again.

## 2020-08-04 NOTE — ED Notes (Signed)
Pt transported to MRI 

## 2020-08-04 NOTE — Telephone Encounter (Signed)
[name] is calling on behalf of patient and wanted Dr Jacqualyn Posey to know that he is still sitting in waiting area since 6;30 last night, has not even have a bed  and has been sitting in a wheelchair all this time.Please advise.

## 2020-08-04 NOTE — ED Provider Notes (Signed)
[facility] EMERGENCY DEPARTMENT Provider Note   CSN: [id number] Arrival date & time: 08/03/20  1748     History Chief Complaint  Patient presents with   Infected Toes    [name] is a 65 y.o. male with a past medical history of chronic lymphedema of the left lower extremity, chronic nonhealing wound of the left foot and is status post left great toe amputation.  He has been followed by Dr. Celesta Gentile at [facility] for more than a year for nonhealing ulcer of the left toes.  He saw him yesterday due to change in coloration of the toe, foul smell, increased pain and drainage from the leg and was told to come immediately to the emergency department for gangrene.  Unfortunately the patient had an extended wait of greater than 18 hours in our waiting room.  He complains of pain in the left lower extremity which is deep and aching.  He denies fever or chills.  He does have foul odor and drainage from the foot.  HPI     Past Medical History:  Diagnosis Date   Acute systolic HF (heart failure) (HCC)    Arthritis    lt foot, hips knees    Atrial fibrillation ([place])    Diabetes mellitus, new onset ([place])    Diverticulosis    Dyslipidemia    Gout attack 01/16/2012   Hypertension    Internal hemorrhoids    Lymphoma ([place])    NHL dx'd 01/2010   Pneumonia due to COVID-19 virus 07/2018   Pulmonary hypertension ([place])    Sepsis ([place]) 02/2017   secondary to influenza; requiring trach   Tubular adenoma of colon    Venous stasis ulcers ([place])     Patient Active Problem List   Diagnosis Date Noted   Permanent atrial fibrillation ([place]) [id number]   Chronic systolic HF (heart failure) ([place]) 07/21/2019   Cough 07/12/2019   Educated about COVID-19 virus infection 05/12/2019   Non-pressure chronic ulcer of other part of left foot with unspecified severity ([place]) 04/11/2019   Pain in limb 04/11/2019   Adverse effect of other vaccines and biological substances,  initial encounter 04/10/2019   COVID-19 virus infection 08/07/2018   Recurrent pneumonia 02/26/2018   Port-A-Cath in place 01/08/2018   History of colonic polyps 12/11/2017   Thrombophilia ([place]) 10/30/2017   Heart failure ([place]) 07/04/2017   Dyspnea on exertion 06/19/2017   Chronic anticoagulation 05/22/2017   Cardiomyopathy ([place]) 05/22/2017   Hypotension 05/22/2017   RBBB 05/22/2017   UTI due to extended-spectrum beta lactamase (ESBL) producing Escherichia coli 04/06/2017   Debility    Wound infection    OSA (obstructive sleep apnea)    Acute systolic heart failure (HCC)    Morbid obesity ([place])    Diabetes mellitus, new onset ([place])    Dysuria    Acute respiratory distress syndrome (ARDS) (HCC)    Influenza A with pneumonia    New onset atrial fibrillation (HCC)    Anxiety    Current moderate episode of major depressive disorder without prior episode (HCC)    Obesity, Class III, BMI 40-49.9 (morbid obesity) ([place])    Community acquired pneumonia    Critical illness myopathy    Dysphagia    Non-Hodgkin's lymphoma ([place])    Benign essential HTN    Leukocytosis    Hypokalemia    Tracheostomy status ([place])    Hypoxic    Pressure injury of skin 02/28/2017   Status post  tracheostomy ([place])    Follicular lymphoma grade II of intrathoracic lymph nodes ([place]) 05/02/2016   Portacath in place 05/04/2015   Localized, primary osteoarthritis of hand 09/08/2014   Atherosclerotic heart disease of native coronary artery without angina pectoris 08/26/2013   Tobacco user 07/30/2012   Gout attack 01/16/2012   Psoriasis [id number]   Follicular lymphoma grade II ([place]) 12/16/2010   Personal history of non-Hodgkin lymphomas 09/20/2010   Peripheral venous insufficiency 11/16/2009   Encounter for general adult medical examination without abnormal findings 06/16/2009   Hyperlipidemia 06/16/2009   Impaired fasting glucose 06/16/2009   Proteinuria 06/16/2009   Varicose veins of other specified sites  06/16/2009    Past Surgical History:  Procedure Laterality Date   AMPUTATION TOE Left 05/08/2019   Procedure: AMPUTATION LEFT GREAT  TOE;  Surgeon: Trula Slade, DPM;  Location: [facility];  Service: Podiatry;  Laterality: Left;   BRONCHOSCOPY     ESOPHAGOGASTRODUODENOSCOPY ENDOSCOPY     Dr Royden Purl HERNIA REPAIR Left    2001   IR REMOVAL TUN ACCESS W/ PORT W/O FL MOD SED  02/03/2020   PORTA CATH INSERTION  2011   TRACHEOSTOMY  03/2017   with decannulation       Family History  Problem Relation Age of Onset   Dementia Brother 69       frontal lobe    Dementia Mother    Arthritis Mother    Hypertension Mother    CVA Sister 27   Colon cancer Neg Hx    Pancreatic cancer Neg Hx    Stomach cancer Neg Hx    Rectal cancer Neg Hx    Liver cancer Neg Hx     Social History   Tobacco Use   Smoking status: Former    Packs/day: 0.50    Years: 20.00    Pack years: 10.00    Types: Cigarettes    Quit date: 01/10/2009    Years since quitting: 11.5   Smokeless tobacco: Never  Vaping Use   Vaping Use: Never used  Substance Use Topics   Alcohol use: Yes    Comment: 2-3 times per week   Drug use: No    Home Medications Prior to Admission medications   Medication Sig Start Date End Date Taking? Authorizing Provider  acetaminophen (TYLENOL) 325 MG tablet Take 650 mg by mouth every 6 (six) hours as needed for moderate pain.     [provider]  albuterol (PROVENTIL HFA;VENTOLIN HFA) 108 (90 Base) MCG/ACT inhaler Inhale 2 puffs into the lungs every 6 (six) hours as needed for wheezing or shortness of breath. 04/05/17   Love, Ivan Anchors, PA-C  allopurinol (ZYLOPRIM) 300 MG tablet Take 300 mg by mouth daily. 12/03/18   [provider]  apixaban (ELIQUIS) 5 MG TABS tablet TAKE 1 TABLET BY MOUTH TWICE A DAY Patient taking differently: Take 5 mg by mouth 2 (two) times daily. 06/24/20   Minus Breeding, MD  atorvastatin (LIPITOR) 20 MG tablet TAKE 1 TABLET BY MOUTH  EVERY DAY *PT OVERDUE FOR OFFICE VISIT* Patient taking differently: Take 20 mg by mouth daily. 04/02/18   Minus Breeding, MD  becaplermin (REGRANEX) 0.01 % gel Apply 1 application topically daily. Measurements right 1st toe 1.6 x 0.6 x 0.1cm 10/10/18   Trula Slade, DPM  carvedilol (COREG) 12.5 MG tablet Take 1 tablet (12.5 mg total) by mouth 2 (two) times daily. 03/09/20   Minus Breeding, MD  cholecalciferol (VITAMIN D) 25 MCG (  1000 UNIT) tablet Take 1,000 Units by mouth daily.    [provider]  collagenase (SANTYL) ointment Apply 1 application topically daily. Measurements to right hallux 2  X 1 x 0.1cm Patient taking differently: Apply 1 application topically daily. Measurements to right hallux 2  X 1 x 0.1cm 02/18/19   Trula Slade, DPM  [employer] DIGESTIVE PROBIOTIC 250 MG capsule Take 500 mg by mouth daily. 08/03/18   [provider]  KLOR-CON M20 20 MEQ tablet TAKE 1 TABLET BY MOUTH EVERY DAY Patient taking differently: Take 20 mEq by mouth daily. 02/10/20   Minus Breeding, MD  losartan (COZAAR) 50 MG tablet TAKE 1 TABLET BY MOUTH EVERY MORNING AND AT BEDTIME Patient taking differently: Take 50 mg by mouth in the morning and at bedtime. 07/13/20   Minus Breeding, MD  Multiple Vitamin (MULTIVITAMIN WITH MINERALS) TABS tablet Take 1 tablet by mouth daily. 04/04/17   Love, Ivan Anchors, PA-C  mupirocin ointment (BACTROBAN) 2 % Apply 1 application topically 2 (two) times daily. 07/30/18   Trula Slade, DPM  Omega 3 340 MG CPDR Take 1 tablet by mouth 2 (two) times daily. 06/16/09   [provider]  omeprazole (PRILOSEC) 40 MG capsule Take 40 mg by mouth daily.  10/15/18   [provider]  sertraline (ZOLOFT) 50 MG tablet Take 1 tablet (50 mg total) by mouth daily. 04/05/17   Love, Ivan Anchors, PA-C  silver sulfADIAZINE (SILVADENE) 1 % cream Apply 1 application topically daily. 09/02/19   Trula Slade, DPM  sodium chloride (OCEAN) 0.65 % SOLN nasal spray Place  2 sprays into both nostrils as needed for congestion. 04/05/17   Love, Ivan Anchors, PA-C  torsemide (DEMADEX) 20 MG tablet Take 2 tablets by mouth daily alternating with 1 tablet by mouth daily. 07/27/20   Katherine Roan, MD  vitamin B-12 (CYANOCOBALAMIN) 1000 MCG tablet Take 1,000 mcg by mouth daily.    [provider]  vitamin C (VITAMIN C) 500 MG tablet Take 1 tablet (500 mg total) by mouth 3 (three) times daily. Patient taking differently: Take 500 mg by mouth 2 (two) times daily. 04/05/17   Love, Ivan Anchors, PA-C    Allergies    Latex and Penicillins  Review of Systems   Review of Systems Ten systems reviewed and are negative for acute change, except as noted in the HPI.   Physical Exam Updated Vital Signs BP (!) 132/59 (BP Location: Left Arm)   Pulse 62   Temp 98.4 F (36.9 C) (Oral)   Resp 18   Ht '5\' 11"'$  (1.803 m)   Wt (!) 170 kg   SpO2 99%   BMI 52.27 kg/m   Physical Exam Vitals and nursing note reviewed.  Constitutional:      Appearance: He is well-developed. He is not diaphoretic.  HENT:     Head: Normocephalic and atraumatic.  Eyes:     General: No scleral icterus.    Conjunctiva/sclera: Conjunctivae normal.  Cardiovascular:     Rate and Rhythm: Normal rate and regular rhythm.     Heart sounds: Normal heart sounds.  Pulmonary:     Effort: Pulmonary effort is normal. No respiratory distress.     Breath sounds: Normal breath sounds.  Abdominal:     Palpations: Abdomen is soft.     Tenderness: There is no abdominal tenderness.  Musculoskeletal:     Cervical back: Normal range of motion and neck supple.     Comments: Chronic swelling  of the left lower extremity.  The second toe is very edematous with black skin changes consistent with necrosis.  The second and third toes are macerated and foul-smelling odor with discharge emanating from those toes.  There is a palpable DP pulse.  Skin:    General: Skin is warm and dry.  Neurological:     Mental Status:  He is alert.  Psychiatric:        Behavior: Behavior normal.    ED Results / Procedures / Treatments   Labs (all labs ordered are listed, but only abnormal results are displayed) Labs Reviewed  COMPREHENSIVE METABOLIC PANEL - Abnormal; Notable for the following components:      Result Value   Sodium 134 (*)    Glucose, Bld 109 (*)    BUN 26 (*)    Albumin 3.2 (*)    Alkaline Phosphatase 134 (*)    Total Bilirubin 1.4 (*)    All other components within normal limits  CBC WITH DIFFERENTIAL/PLATELET - Abnormal; Notable for the following components:   WBC 10.6 (*)    Neutro Abs 8.2 (*)    All other components within normal limits  CULTURE, BLOOD (ROUTINE X 2)  CULTURE, BLOOD (ROUTINE X 2)  LACTIC ACID, PLASMA  LACTIC ACID, PLASMA    EKG None  Radiology DG Foot Complete Left  Result Date: 08/03/2020 CLINICAL DATA:  65 year old male with left foot infection. EXAM: LEFT FOOT - COMPLETE 3+ VIEW COMPARISON:  Earlier radiograph dated 08/03/2020. FINDINGS: Amputation of the first ray distal to the metatarsal head. No definite acute fracture. Evaluation however is limited due to osteopenia and overlying dressing. There is subluxed appearance of the distal interphalangeal joint of the second toe which is suboptimally evaluated on this radiograph. The distal phalanx of the second toe appears lateral to the middle phalanx. There is significant degenerative changes of the tarsometatarsal joints. There is diffuse soft tissue swelling and subcutaneous edema. Calcification of the skin noted in the distal leg. IMPRESSION: 1. No definite acute fracture. 2. Subluxation of the distal interphalangeal joint of the second toe, suboptimally evaluated on this radiograph. 3. Diffuse soft tissue swelling and subcutaneous edema. Electronically Signed   By: Anner Crete M.D.   On: 08/03/2020 21:28    Procedures Procedures   Medications Ordered in ED Medications - No data to display  ED Course  I have  reviewed the triage vital signs and the nursing notes.  Pertinent labs & imaging results that were available during my care of the patient were reviewed by me and considered in my medical decision making (see chart for details).    MDM Rules/Calculators/A&P                           Patient here with gangrene of the left lower foot. Question osteomyelitis.  He does not appear to have any symptoms of systemic illness, is afebrile.  I have ordered cefepime, Flagyl and vancomycin for treatment of the infection.  I reviewed plain films of the left lower foot ordered in triage which shows no obvious bony degradation however clinical exam is consistent with gangrene.  I discussed the case with Dr. Celesta Gentile Via telephone.  He asks that we admit the patient and begin antibiotics.  I have ordered an MRI.  He plans on amputation tomorrow.  Final Clinical Impression(s) / ED Diagnoses Final diagnoses:  None    Rx / DC Orders ED Discharge Orders  None        Margarita Mail, PA-C 08/04/20 1151    Luna Fuse, MD 08/07/20 1054

## 2020-08-04 NOTE — Consult Note (Signed)
WOC Nurse Consult Note: Reason for Consult: Left foot 2nd digit and 3rd digit with infection and declining status, edema.  Right foot with digits that are edematous and with skin breakdown. Patient has been sitting in chair for >15 hours. Wound type: Vascular insufficiency, neuropathic, infection Pressure Injury POA: N/A Measurement: Refer to photos taken yesterday for wound presentation/distribution. Noted is ulcer on plantar aspect of left foot, 2nd digit with periwound callus.  Wound bed:red with yellow nonviable tissue and black/purple tissue. Drainage (amount, consistency, odor)  Periwound: Dressing procedure/placement/frequency: Podiatry (Dr. Cannon Kettle) has been following and is going to see this afternoon. Wound care vs operative procedure evaluation. Guidance provided for nursing for twice daily cleansing for digits of both feel with drying between digits as able.  Topical care with a single layer of nonadherent, antimicrobial gauze (xeroform) is provided. A silicone foam prophylactic dressing is provided to the sacrum. When patient is in bed, Prevalon boots are also provided.  [facility] nursing team will not follow, but will remain available to this patient, the nursing and medical teams.  Please re-consult if needed. Thanks, Maudie Flakes, MSN, RN, [place], Arther Abbott  Pager# [phone]

## 2020-08-04 NOTE — Progress Notes (Signed)
Pharmacy Antibiotic Note  [name] is a 65 y.o. male admitted on 08/03/2020 with cellulitis.  Pharmacy has been consulted for vancomycin and aztreonam dosing.  Plan: Vanc '2500mg'$  x1 LD then '1500mg'$  IV q12h (nomogrom dosing) Aztreonam 2g IV q8h -Monitor renal function, clinical status, and antibiotic plan  Height: '5\' 11"'$  (180.3 cm) Weight: (!) 170 kg (374 lb 12.5 oz) IBW/kg (Calculated) : 75.3  Temp (24hrs), Avg:98.6 F (37 C), Min:98.1 F (36.7 C), Max:99.1 F (37.3 C)  Recent Labs  Lab 08/03/20 2044  WBC 10.6*  CREATININE 1.08  LATICACIDVEN 1.0    Estimated Creatinine Clearance: 109.2 mL/min (by C-G formula based on SCr of 1.08 mg/dL).    Allergies  Allergen Reactions   Latex Hives and Swelling   Penicillins Swelling    Age 25 or 74 - pt states he had swelling all over and his lips got real big   Antimicrobials this admission: 7/26 Vanc >>  7/26 Aztreonam >>  7/26 Flagyl >>  Dose adjustments this admission: N/A  Microbiology results: 7/26 BCx:   Thank you for allowing pharmacy to be a part of this patient's care.  Joetta Manners, PharmD, [facility] Emergency Medicine Clinical Pharmacist ED RPh Phone: [place]: [phone]

## 2020-08-04 NOTE — H&P (Signed)
History and Physical    [name] DOB: May 27, 1955 DOA: 08/03/2020  PCP: Ginger Organ., MD Consultants:  Jacqualyn Posey - podiatry; [facility] - oncology; [facility] - cardiology Patient coming from:  Home - lives alone; NOK: Howell Rucks, [phone]  Chief Complaint: Foot infection  HPI: [name] is a 65 y.o. male with medical history significant of follicular lymphoma; morbid obesity; chronic systolic CHF; afib; DM; HLD; and HTN presenting with L foot infection.  Last year, he had his L great toe removed - he wore water shoes in [place] and it got infected on the bottom.  After it healed, he started wearing shoes again and the other 2 toes took the brunt of the weight and got infected.  He took antibiotics only once for this. They have been infected and he was seen yesterday and was told to come in for IV antibiotics and likely further amputation.  He reports "a little" fever once; his friend reports "little ones off and on" for the last couple of weeks.  He has had 2 recent EF visits for CHF.  Dr. Shan Levans changed his Lasix and Coreg recently at f/u last week.    ED Course: Gangrene of L 2nd toe, already had 1st toe amputation.  Non-healing wound, managed by podiatry.  He was sent to the ER by podiatry for admission, sat for 18 hours in the ER.  Dr. Jacqualyn Posey requests MRI, likely amputation tomorrow.    Review of Systems: As per HPI; otherwise review of systems reviewed and negative.   Ambulatory Status:  Ambulates with a cane  COVID Vaccine Status:  Complete, no booster  Past Medical History:  Diagnosis Date   Acute systolic HF (heart failure) (HCC)    Arthritis    lt foot, hips knees    Atrial fibrillation ([place])    Diabetes mellitus, new onset ([place])    Diverticulosis    Dyslipidemia    Gout attack 01/16/2012   Hypertension    Internal hemorrhoids    Lymphoma ([place])    remission for about 2 years, chemo 3 years prior   Pneumonia due to COVID-19  virus 07/2018   Pulmonary hypertension ([place])    Sepsis ([place]) 02/2017   secondary to influenza; requiring trach   Tubular adenoma of colon    Venous stasis ulcers ([place])     Past Surgical History:  Procedure Laterality Date   AMPUTATION TOE Left 05/08/2019   Procedure: AMPUTATION LEFT GREAT  TOE;  Surgeon: Trula Slade, DPM;  Location: [facility];  Service: Podiatry;  Laterality: Left;   BRONCHOSCOPY     ESOPHAGOGASTRODUODENOSCOPY ENDOSCOPY     Dr Royden Purl HERNIA REPAIR Left    2001   IR REMOVAL TUN ACCESS W/ PORT W/O FL MOD SED  02/03/2020   PORTA CATH INSERTION  2011   TRACHEOSTOMY  03/2017   with decannulation    Social History   Socioeconomic History   Marital status: Divorced    Spouse name: Not on file   Number of children: Not on file   Years of education: Not on file   Highest education level: Not on file  Occupational History   Occupation: cosmetologist  Tobacco Use   Smoking status: Former    Packs/day: 0.50    Years: 20.00    Pack years: 10.00    Types: Cigarettes    Quit date: 01/10/2009    Years since quitting: 11.5   Smokeless tobacco: Never  Vaping  Use   Vaping Use: Never used  Substance and Sexual Activity   Alcohol use: Not Currently    Comment: 2-3 times per week   Drug use: No   Sexual activity: Not on file  Other Topics Concern   Not on file  Social History Narrative   Not on file   Social Determinants of Health   Financial Resource Strain: Not on file  Food Insecurity: Not on file  Transportation Needs: Not on file  Physical Activity: Not on file  Stress: Not on file  Social Connections: Not on file  Intimate Partner Violence: Not on file    Allergies  Allergen Reactions   Latex Hives and Swelling   Penicillins Swelling    Age 38 or 71 - pt states he had swelling all over and his lips got real big    Family History  Problem Relation Age of Onset   Dementia Brother 60       frontal lobe    Dementia Mother     Arthritis Mother    Hypertension Mother    CVA Sister 61   Colon cancer Neg Hx    Pancreatic cancer Neg Hx    Stomach cancer Neg Hx    Rectal cancer Neg Hx    Liver cancer Neg Hx     Prior to Admission medications   Medication Sig Start Date End Date Taking? Authorizing Provider  acetaminophen (TYLENOL) 325 MG tablet Take 650 mg by mouth every 6 (six) hours as needed for moderate pain.     [provider]  albuterol (PROVENTIL HFA;VENTOLIN HFA) 108 (90 Base) MCG/ACT inhaler Inhale 2 puffs into the lungs every 6 (six) hours as needed for wheezing or shortness of breath. 04/05/17   Love, Ivan Anchors, PA-C  allopurinol (ZYLOPRIM) 300 MG tablet Take 300 mg by mouth daily. 12/03/18   [provider]  apixaban (ELIQUIS) 5 MG TABS tablet TAKE 1 TABLET BY MOUTH TWICE A DAY Patient taking differently: Take 5 mg by mouth 2 (two) times daily. 06/24/20   Minus Breeding, MD  atorvastatin (LIPITOR) 20 MG tablet TAKE 1 TABLET BY MOUTH EVERY DAY *PT OVERDUE FOR OFFICE VISIT* Patient taking differently: Take 20 mg by mouth daily. 04/02/18   Minus Breeding, MD  becaplermin (REGRANEX) 0.01 % gel Apply 1 application topically daily. Measurements right 1st toe 1.6 x 0.6 x 0.1cm 10/10/18   Trula Slade, DPM  carvedilol (COREG) 12.5 MG tablet Take 1 tablet (12.5 mg total) by mouth 2 (two) times daily. 03/09/20   Minus Breeding, MD  cholecalciferol (VITAMIN D) 25 MCG (1000 UNIT) tablet Take 1,000 Units by mouth daily.    [provider]  collagenase (SANTYL) ointment Apply 1 application topically daily. Measurements to right hallux 2  X 1 x 0.1cm Patient taking differently: Apply 1 application topically daily. Measurements to right hallux 2  X 1 x 0.1cm 02/18/19   Trula Slade, DPM  [employer] DIGESTIVE PROBIOTIC 250 MG capsule Take 500 mg by mouth daily. 08/03/18   [provider]  KLOR-CON M20 20 MEQ tablet TAKE 1 TABLET BY MOUTH EVERY DAY Patient taking differently: Take 20 mEq  by mouth daily. 02/10/20   Minus Breeding, MD  losartan (COZAAR) 50 MG tablet TAKE 1 TABLET BY MOUTH EVERY MORNING AND AT BEDTIME Patient taking differently: Take 50 mg by mouth in the morning and at bedtime. 07/13/20   Minus Breeding, MD  Multiple Vitamin (MULTIVITAMIN WITH MINERALS) TABS tablet Take 1 tablet  by mouth daily. 04/04/17   Love, Ivan Anchors, PA-C  mupirocin ointment (BACTROBAN) 2 % Apply 1 application topically 2 (two) times daily. 07/30/18   Trula Slade, DPM  Omega 3 340 MG CPDR Take 1 tablet by mouth 2 (two) times daily. 06/16/09   [provider]  omeprazole (PRILOSEC) 40 MG capsule Take 40 mg by mouth daily.  10/15/18   [provider]  sertraline (ZOLOFT) 50 MG tablet Take 1 tablet (50 mg total) by mouth daily. 04/05/17   Love, Ivan Anchors, PA-C  silver sulfADIAZINE (SILVADENE) 1 % cream Apply 1 application topically daily. 09/02/19   Trula Slade, DPM  sodium chloride (OCEAN) 0.65 % SOLN nasal spray Place 2 sprays into both nostrils as needed for congestion. 04/05/17   Love, Ivan Anchors, PA-C  torsemide (DEMADEX) 20 MG tablet Take 2 tablets by mouth daily alternating with 1 tablet by mouth daily. 07/27/20   Katherine Roan, MD  vitamin B-12 (CYANOCOBALAMIN) 1000 MCG tablet Take 1,000 mcg by mouth daily.    [provider]  vitamin C (VITAMIN C) 500 MG tablet Take 1 tablet (500 mg total) by mouth 3 (three) times daily. Patient taking differently: Take 500 mg by mouth 2 (two) times daily. 04/05/17   Bary Leriche, PA-C    Physical Exam: Vitals:   08/04/20 1615 08/04/20 1630 08/04/20 1700 08/04/20 1715  BP: 121/86  115/76 128/84  Pulse: (!) 107 (!) 58 (!) 53 64  Resp: $Remo'17 17 19 19  'oAtCT$ Temp:      TempSrc:      SpO2: 90% 98% 97% 100%  Weight:      Height:         General:  Appears calm and comfortable and is in NAD Eyes:  PERRL, EOMI, normal lids, iris ENT:  grossly normal hearing, lips & tongue, mmm Neck:  no LAD, masses or thyromegaly; well healed  trach scar Cardiovascular:  RRR, no m/r/g. 1-2+ brauny LE edema.  Respiratory:   CTA bilaterally with no wheezes/rales/rhonchi.  Normal respiratory effort. Abdomen:  soft, NT, ND Skin:  gangrene of left 2nd toe with extension onto the L 3rd toe, ulceration on the plantar surface.  There is surrounding erythema extending onto the dorsal midfoot.  R foot with scabbing along the anterior lateral toes.     Musculoskeletal:  grossly normal tone BUE/BLE, good ROM, no bony abnormality other than as above Psychiatric:  grossly normal mood and affect, speech fluent and appropriate, AOx3 Neurologic:  CN 2-12 grossly intact, moves all extremities in coordinated fashion    Radiological Exams on Admission: Independently reviewed - see discussion in A/P where applicable  MR FOOT LEFT WO CONTRAST  Result Date: 08/04/2020 CLINICAL DATA:  Osteomyelitis, foot. Chronic lower extremity lymphedema. Chronic nonhealing wound of the left foot. Left great toe amputation in April of 2021 EXAM: MRI OF THE LEFT FOOT WITHOUT CONTRAST TECHNIQUE: Multiplanar, multisequence MR imaging of the left forefoot was performed. No intravenous contrast was administered. COMPARISON:  X-ray 08/03/2020 FINDINGS: Technical note: Despite efforts by the technologist and patient, motion artifact is present on today's exam and could not be eliminated. This reduces exam sensitivity and specificity. Bones/Joint/Cartilage Status post transmetatarsal amputation of the first ray at the level of the distal metatarsal neck. There is bone marrow edema within the distal 1.9 cm of the residual first metatarsal (series 7, image 12). Subtle intermediate T1 signal in the marrow at this location without confluent low T1 signal change. Prominent bone  marrow edema throughout the proximal, middle, and distal phalanx of the second toe with associated intermediate-low T1 marrow signal (series 6, images 10-11). There is lateral subluxation of the second toe at the  DIP joint. Marrow edema is also present within the proximal, middle, and distal phalanx of the third toe with confluent low T1 signal change within the proximal and middle phalanx (series 6, images 13-14). Preserved T1 marrow signal of the remaining visualized forefoot. Ligaments Intact Lisfranc ligament. Collateral ligaments of the lesser MTP joints are intact. Muscles and Tendons Chronic denervation changes of the intrinsic foot musculature. Soft tissues Prominent circumferential soft tissue swelling with dorsal subcutaneous edema. Probable soft tissue ulceration at the second toe along its medial aspect. Soft tissue thickening/fibrosis overlies the residual first metatarsal resection margin. No organized fluid collection or abscess. IMPRESSION: 1. Findings most consistent with acute osteomyelitis of the second and third toes, as above. 2. Status post transmetatarsal amputation of the first ray. Bone marrow edema within the distal 1.9 cm of the residual first metatarsal may reflect reactive osteitis versus early acute osteomyelitis. 3. Prominent circumferential soft tissue swelling with dorsal subcutaneous edema. Probable soft tissue ulceration at the second toe along its medial aspect. No organized fluid collection or abscess. Electronically Signed   By: Davina Poke D.O.   On: 08/04/2020 15:04   DG Foot Complete Left  Result Date: 08/03/2020 CLINICAL DATA:  65 year old male with left foot infection. EXAM: LEFT FOOT - COMPLETE 3+ VIEW COMPARISON:  Earlier radiograph dated 08/03/2020. FINDINGS: Amputation of the first ray distal to the metatarsal head. No definite acute fracture. Evaluation however is limited due to osteopenia and overlying dressing. There is subluxed appearance of the distal interphalangeal joint of the second toe which is suboptimally evaluated on this radiograph. The distal phalanx of the second toe appears lateral to the middle phalanx. There is significant degenerative changes of the  tarsometatarsal joints. There is diffuse soft tissue swelling and subcutaneous edema. Calcification of the skin noted in the distal leg. IMPRESSION: 1. No definite acute fracture. 2. Subluxation of the distal interphalangeal joint of the second toe, suboptimally evaluated on this radiograph. 3. Diffuse soft tissue swelling and subcutaneous edema. Electronically Signed   By: Anner Crete M.D.   On: 08/03/2020 21:28    EKG: Independently reviewed.  Afib with rate 62; RBBB   Labs on Admission: I have personally reviewed the available labs and imaging studies at the time of the admission.  Pertinent labs:   Unremarkable CMP Lactate 1.0 WBC 10.6   Assessment/Plan Principal Problem:   Gangrene (HCC) Active Problems:   Follicular lymphoma grade II (HCC)   Morbid obesity (HCC)   Diabetes mellitus, new onset (HCC)   Chronic systolic HF (heart failure) (HCC)   Permanent atrial fibrillation (HCC)    Diabetic foot infection -Prior left 1st toe amputation, now with gangrene and progressive infection of 2/3 toes and cellulitis on the forefoot -Patient has been followed by podiatry and was sent over for IV antibiotics and MRI with anticipated need for amputation -Negative lactate, no current concerns for sepsis -Will treat with IV antibiotics (Aztreonam/Flagyl/Vanc as per the diabetic foot ulcer algorithm) -Dr. Jacqualyn Posey is consulting and plans to operate on the patient tomorrow - he appears to need a TMA -I have ordered ABIs in case revascularization may be indicated; he has more superficial wounds on the R toes but it appears reasonable to do ABIs bilaterally -Patient is NPO after midnight in case he needs a procedure  tomorrow -LE wound order set utilized including labs (CRP, ESR, A1c, prealbumin, HIV, and blood cultures) and consults (diabetes coordinator; peripheral vascular navigator; TOC team; wound care; and nutrition)   Chronic systolic CHF -Last echo was in 11/2018 and showed EF  45-50% -Appears to be compensated at this time  Afib  -Hold Eliquis for now -Rate controlled with Coreg  DM -Will check A1c; previously 6.3 -He appears to be diet controlled -Cover with moderate-scale SSI   HLD -Continue Lipitor  HTN -Continue Coreg, Cozaar  Follicular lymphoma -In remission  Obesity -Body mass index is 52.27 kg/m..  -Weight loss should be encouraged -Outpatient PCP/bariatric medicine/bariatric surgery f/u encouraged       Note: This patient has been tested and is negative for the novel coronavirus COVID-19. The patient has been fully vaccinated against COVID-19.   Level of care: Med-Surg DVT prophylaxis: Heparin Code Status:  Full - confirmed with patient/family Family Communication: Friend was present for the majority of the evaluation Disposition Plan:  The patient is from: home  Anticipated d/c is to: to be determined  Anticipated d/c date will depend on clinical response to treatment, likely several days  Patient is currently: acutely ill Consults called: Podiatry; diabetes coordinator; peripheral vascular navigator; TOC team; wound care; and nutrition  Admission status:  Admit - It is my clinical opinion that admission to INPATIENT is reasonable and necessary because of the expectation that this patient will require hospital care that crosses at least 2 midnights to treat this condition based on the medical complexity of the problems presented.  Given the aforementioned information, the predictability of an adverse outcome is felt to be significant.    Karmen Bongo MD [facility]   How to contact the [facility] Attending or Consulting provider [place] or covering provider during after hours [place], for this patient?  Check the care team in [facility] and look for a) attending/consulting [facility] provider listed and b) the [facility] team listed Log into [email or web address] and use [place]'s universal password to access. If you do not have the password, please  contact the hospital operator. Locate the [facility] provider you are looking for under [facility] and page to a number that you can be directly reached. If you still have difficulty reaching the provider, please page the [facility] (Director on Call) for the Hospitalists listed on amion for assistance.   08/04/2020, 6:40 PM

## 2020-08-05 ENCOUNTER — Encounter (HOSPITAL_COMMUNITY)
Admission: EM | Disposition: A | Discharge status: Rehabilitation Facility | Payer: Self-pay | Source: Home / Self Care | Attending: Internal Medicine

## 2020-08-05 ENCOUNTER — Encounter (HOSPITAL_COMMUNITY): Payer: Self-pay | Admitting: Internal Medicine

## 2020-08-05 ENCOUNTER — Inpatient Hospital Stay (HOSPITAL_COMMUNITY): Payer: Medicare Other | Admitting: Anesthesiology

## 2020-08-05 ENCOUNTER — Inpatient Hospital Stay (HOSPITAL_COMMUNITY): Payer: Medicare Other

## 2020-08-05 ENCOUNTER — Ambulatory Visit (HOSPITAL_COMMUNITY): Payer: BC Managed Care – PPO

## 2020-08-05 DIAGNOSIS — I96 Gangrene, not elsewhere classified: Secondary | ICD-10-CM | POA: Diagnosis not present

## 2020-08-05 DIAGNOSIS — E1152 Type 2 diabetes mellitus with diabetic peripheral angiopathy with gangrene: Secondary | ICD-10-CM | POA: Diagnosis not present

## 2020-08-05 DIAGNOSIS — I5022 Chronic systolic (congestive) heart failure: Secondary | ICD-10-CM

## 2020-08-05 DIAGNOSIS — C8212 Follicular lymphoma grade II, intrathoracic lymph nodes: Secondary | ICD-10-CM

## 2020-08-05 DIAGNOSIS — I4821 Permanent atrial fibrillation: Secondary | ICD-10-CM | POA: Diagnosis not present

## 2020-08-05 DIAGNOSIS — M86672 Other chronic osteomyelitis, left ankle and foot: Secondary | ICD-10-CM

## 2020-08-05 DIAGNOSIS — Z8616 Personal history of COVID-19: Secondary | ICD-10-CM | POA: Diagnosis not present

## 2020-08-05 DIAGNOSIS — L89320 Pressure ulcer of left buttock, unstageable: Secondary | ICD-10-CM | POA: Diagnosis not present

## 2020-08-05 HISTORY — PX: TRANSMETATARSAL AMPUTATION: SHX6197

## 2020-08-05 LAB — CBC
HCT: 40.1 % (ref 39.0–52.0)
Hemoglobin: 12.7 g/dL — ABNORMAL LOW (ref 13.0–17.0)
MCH: 31 pg (ref 26.0–34.0)
MCHC: 31.7 g/dL (ref 30.0–36.0)
MCV: 97.8 fL (ref 80.0–100.0)
Platelets: 243 10*3/uL (ref 150–400)
RBC: 4.1 MIL/uL — ABNORMAL LOW (ref 4.22–5.81)
RDW: 14.8 % (ref 11.5–15.5)
WBC: 7.5 10*3/uL (ref 4.0–10.5)
nRBC: 0 % (ref 0.0–0.2)

## 2020-08-05 LAB — BASIC METABOLIC PANEL
Anion gap: 7 (ref 5–15)
BUN: 18 mg/dL (ref 8–23)
CO2: 21 mmol/L — ABNORMAL LOW (ref 22–32)
Calcium: 8.6 mg/dL — ABNORMAL LOW (ref 8.9–10.3)
Chloride: 107 mmol/L (ref 98–111)
Creatinine, Ser: 0.83 mg/dL (ref 0.61–1.24)
GFR, Estimated: >60 mL/min (ref >60)
Glucose, Bld: 134 mg/dL — ABNORMAL HIGH (ref 70–99)
Potassium: 4 mmol/L (ref 3.5–5.1)
Sodium: 135 mmol/L (ref 135–145)

## 2020-08-05 LAB — GLUCOSE, CAPILLARY
Glucose-Capillary: 115 mg/dL — ABNORMAL HIGH (ref 70–99)
Glucose-Capillary: 106 mg/dL — ABNORMAL HIGH (ref 70–99)
Glucose-Capillary: 95 mg/dL (ref 70–99)
Glucose-Capillary: 102 mg/dL — ABNORMAL HIGH (ref 70–99)
Glucose-Capillary: 96 mg/dL (ref 70–99)
Glucose-Capillary: 95 mg/dL (ref 70–99)
Glucose-Capillary: 129 mg/dL — ABNORMAL HIGH (ref 70–99)

## 2020-08-05 LAB — HEMOGLOBIN A1C
Hgb A1c MFr Bld: 6.4 % — ABNORMAL HIGH (ref 4.8–5.6)
Mean Plasma Glucose: 137 mg/dL

## 2020-08-05 LAB — SURGICAL PCR SCREEN
MRSA, PCR: NEGATIVE
Staphylococcus aureus: POSITIVE — AB

## 2020-08-05 LAB — SARS CORONAVIRUS 2 (TAT 6-24 HRS): SARS Coronavirus 2: NEGATIVE

## 2020-08-05 SURGERY — AMPUTATION, FOOT, TRANSMETATARSAL
Anesthesia: Monitor Anesthesia Care | Site: Toe | Laterality: Left

## 2020-08-05 MED ORDER — BUPIVACAINE HCL 0.5 % IJ SOLN
INTRAMUSCULAR | Status: DC | PRN
  Administered 2020-08-05: 10 mL

## 2020-08-05 MED ORDER — LIDOCAINE 1 % OPTIME INJ - NO CHARGE
INTRAMUSCULAR | Status: DC | PRN
  Administered 2020-08-05: 10 mL

## 2020-08-05 MED ORDER — CHLORHEXIDINE GLUCONATE CLOTH 2 % EX PADS: 6.0000 | MEDICATED_PAD | Freq: Once | CUTANEOUS | Status: DC

## 2020-08-05 MED ORDER — CHLORHEXIDINE GLUCONATE CLOTH 2 % EX PADS
6.0000 | MEDICATED_PAD | Freq: Every day | CUTANEOUS | Status: AC
Start: 2020-08-05 — End: 2020-08-10
  Administered 2020-08-05 – 2020-08-09 (×4): 6 via TOPICAL

## 2020-08-05 MED ORDER — PROPOFOL 500 MG/50ML IV EMUL
INTRAVENOUS | Status: DC | PRN
  Administered 2020-08-05: 50 ug/kg/min via INTRAVENOUS

## 2020-08-05 MED ORDER — MUPIROCIN 2 % EX OINT
1.0000 "application " | TOPICAL_OINTMENT | Freq: Two times a day (BID) | CUTANEOUS | Status: AC
  Administered 2020-08-05 – 2020-08-09 (×10): 1 via NASAL
  Filled 2020-08-05 (×2): qty 22

## 2020-08-05 MED ORDER — FENTANYL CITRATE (PF) 250 MCG/5ML IJ SOLN
INTRAMUSCULAR | Status: AC
  Filled 2020-08-05: qty 5

## 2020-08-05 MED ORDER — FENTANYL CITRATE (PF) 100 MCG/2ML IJ SOLN
INTRAMUSCULAR | Status: AC
  Filled 2020-08-05: qty 2

## 2020-08-05 MED ORDER — ONDANSETRON HCL 4 MG/2ML IJ SOLN: 4.0000 mg | Freq: Once | INTRAMUSCULAR | Status: DC | PRN

## 2020-08-05 MED ORDER — 0.9 % SODIUM CHLORIDE (POUR BTL) OPTIME
TOPICAL | Status: DC | PRN
  Administered 2020-08-05: 1000 mL

## 2020-08-05 MED ORDER — CHLORHEXIDINE GLUCONATE 0.12 % MT SOLN: 15.0000 mL | Freq: Once | OROMUCOSAL | Status: AC

## 2020-08-05 MED ORDER — BUPIVACAINE HCL (PF) 0.5 % IJ SOLN
INTRAMUSCULAR | Status: AC
  Filled 2020-08-05: qty 30

## 2020-08-05 MED ORDER — FENTANYL CITRATE (PF) 100 MCG/2ML IJ SOLN
25.0000 ug | INTRAMUSCULAR | Status: DC | PRN
  Administered 2020-08-05: 50 ug via INTRAVENOUS

## 2020-08-05 MED ORDER — PHENYLEPHRINE HCL-NACL 10-0.9 MG/250ML-% IV SOLN
INTRAVENOUS | Status: DC | PRN
  Administered 2020-08-05: 50 ug/min via INTRAVENOUS

## 2020-08-05 MED ORDER — ACETAMINOPHEN 10 MG/ML IV SOLN
1000.0000 mg | Freq: Once | INTRAVENOUS | Status: DC | PRN
  Administered 2020-08-05: 1000 mg via INTRAVENOUS

## 2020-08-05 MED ORDER — MIDAZOLAM HCL 2 MG/2ML IJ SOLN
INTRAMUSCULAR | Status: AC
  Filled 2020-08-05: qty 2

## 2020-08-05 MED ORDER — ACETAMINOPHEN 10 MG/ML IV SOLN
INTRAVENOUS | Status: AC
  Filled 2020-08-05: qty 100

## 2020-08-05 MED ORDER — CHLORHEXIDINE GLUCONATE 0.12 % MT SOLN
OROMUCOSAL | Status: AC
  Administered 2020-08-05: 15 mL via OROMUCOSAL
  Filled 2020-08-05: qty 15

## 2020-08-05 MED ORDER — LOSARTAN POTASSIUM 50 MG PO TABS
50.0000 mg | ORAL_TABLET | Freq: Two times a day (BID) | ORAL | Status: DC
  Administered 2020-08-06 – 2020-08-08 (×6): 50 mg via ORAL
  Filled 2020-08-05 (×6): qty 1

## 2020-08-05 MED ORDER — AMISULPRIDE (ANTIEMETIC) 5 MG/2ML IV SOLN: 10.0000 mg | Freq: Once | INTRAVENOUS | Status: DC | PRN

## 2020-08-05 MED ORDER — LIDOCAINE HCL (PF) 1 % IJ SOLN
INTRAMUSCULAR | Status: AC
  Filled 2020-08-05: qty 30

## 2020-08-05 MED ORDER — ACETAMINOPHEN 500 MG PO TABS
1000.0000 mg | ORAL_TABLET | Freq: Once | ORAL | Status: AC
  Administered 2020-08-05: 1000 mg via ORAL
  Filled 2020-08-05: qty 2

## 2020-08-05 MED ORDER — ORAL CARE MOUTH RINSE: 15.0000 mL | Freq: Once | OROMUCOSAL | Status: AC

## 2020-08-05 MED ORDER — LACTATED RINGERS IV SOLN: INTRAVENOUS | Status: DC

## 2020-08-05 SURGICAL SUPPLY — 44 items
BAG COUNTER SPONGE SURGICOUNT (BAG) ×2 IMPLANT
BAG SPNG CNTER NS LX DISP (BAG) ×1
BANDAGE ESMARK 6X9 LF (GAUZE/BANDAGES/DRESSINGS) IMPLANT
BLADE AVERAGE 25X9 (BLADE) ×2 IMPLANT
BLADE SURG 10 STRL SS (BLADE) ×2 IMPLANT
BNDG CMPR 9X6 STRL LF SNTH (GAUZE/BANDAGES/DRESSINGS) ×1
BNDG COHESIVE 4X5 TAN STRL (GAUZE/BANDAGES/DRESSINGS) ×2 IMPLANT
BNDG ELASTIC 4X5.8 VLCR STR LF (GAUZE/BANDAGES/DRESSINGS) ×1 IMPLANT
BNDG ESMARK 6X9 LF (GAUZE/BANDAGES/DRESSINGS) ×2
BNDG GAUZE ELAST 4 BULKY (GAUZE/BANDAGES/DRESSINGS) ×1 IMPLANT
COVER SURGICAL LIGHT HANDLE (MISCELLANEOUS) ×1 IMPLANT
CUFF TOURN SGL QUICK 24 (TOURNIQUET CUFF) ×2
CUFF TRNQT CYL 24X4X16.5-23 (TOURNIQUET CUFF) IMPLANT
DRAPE C-ARM MINI (DRAPES) ×2 IMPLANT
DRAPE U-SHAPE 47X51 STRL (DRAPES) ×4 IMPLANT
ELECT REM PT RETURN 9FT ADLT (ELECTROSURGICAL) ×2
ELECTRODE REM PT RTRN 9FT ADLT (ELECTROSURGICAL) ×1 IMPLANT
GAUZE SPONGE 4X4 12PLY STRL (GAUZE/BANDAGES/DRESSINGS) ×2 IMPLANT
GAUZE XEROFORM 5X9 LF (GAUZE/BANDAGES/DRESSINGS) ×2 IMPLANT
GLOVE SURG ENC MOIS LTX SZ8 (GLOVE) ×4 IMPLANT
GOWN STRL REUS W/ TWL LRG LVL3 (GOWN DISPOSABLE) ×1 IMPLANT
GOWN STRL REUS W/ TWL XL LVL3 (GOWN DISPOSABLE) ×1 IMPLANT
GOWN STRL REUS W/TWL LRG LVL3 (GOWN DISPOSABLE) ×2
GOWN STRL REUS W/TWL XL LVL3 (GOWN DISPOSABLE) ×2
KIT BASIN OR (CUSTOM PROCEDURE TRAY) ×2 IMPLANT
KIT TURNOVER KIT B (KITS) ×2 IMPLANT
NDL 25GX 5/8IN NON SAFETY (NEEDLE) ×1 IMPLANT
NEEDLE 25GX 5/8IN NON SAFETY (NEEDLE) ×4 IMPLANT
NS IRRIG 1000ML POUR BTL (IV SOLUTION) ×2 IMPLANT
PACK ORTHO EXTREMITY (CUSTOM PROCEDURE TRAY) ×2 IMPLANT
PAD ARMBOARD 7.5X6 YLW CONV (MISCELLANEOUS) ×4 IMPLANT
PAD CAST 4YDX4 CTTN HI CHSV (CAST SUPPLIES) ×1 IMPLANT
PADDING CAST COTTON 4X4 STRL (CAST SUPPLIES) ×2
SPONGE T-LAP 18X18 ~~LOC~~+RFID (SPONGE) ×4 IMPLANT
STAPLER VISISTAT 35W (STAPLE) ×2 IMPLANT
SUT MNCRL AB 3-0 PS2 18 (SUTURE) ×1 IMPLANT
SUT PROLENE 3 0 PS 2 (SUTURE) ×4 IMPLANT
SUT PROLENE 3 0 SH 48 (SUTURE) ×1 IMPLANT
SYR CONTROL 10ML LL (SYRINGE) ×3 IMPLANT
TOWEL GREEN STERILE (TOWEL DISPOSABLE) ×2 IMPLANT
TOWEL GREEN STERILE FF (TOWEL DISPOSABLE) ×2 IMPLANT
TUBE CONNECTING 12X1/4 (SUCTIONS) ×2 IMPLANT
WATER STERILE IRR 1000ML POUR (IV SOLUTION) ×2 IMPLANT
YANKAUER SUCT BULB TIP NO VENT (SUCTIONS) ×2 IMPLANT

## 2020-08-05 NOTE — Op Note (Signed)
PATIENT:  [name]  65 y.o. male  PRE-OPERATIVE DIAGNOSIS:  osteomyelitis  POST-OPERATIVE DIAGNOSIS:  osteomyelitis  PROCEDURE:  Procedure(s): TRANSMETATARSAL AMPUTATION (Left)  SURGEON:  Surgeon(s) and Role:    * Trula Slade, DPM - Primary  PHYSICIAN ASSISTANT:   ASSISTANTS: none   ANESTHESIA:   MAC  EBL:  25 cc   BLOOD ADMINISTERED:none  DRAINS: none   LOCAL MEDICATIONS USED:  OTHER 20 cc lidocaine and marcaine plain  SPECIMEN:  Source of Specimen:  foot to pathology  DISPOSITION OF SPECIMEN:  PATHOLOGY  COUNTS:  YES  TOURNIQUET:  * Missing tourniquet times found for documented tourniquets in log: [id number] *  DICTATION: .Dragon Dictation  PLAN OF CARE: Admit to inpatient   PATIENT DISPOSITION:  PACU - hemodynamically stable.   Delay start of Pharmacological VTE agent (>24hrs) due to surgical blood loss or risk of bleeding: no  Indications for surgery: 65 year old male with a previous partial first amputation with longstanding wound on the second and third toes.  The wound had been stable however he reported to clinic with worsening pain, odor from the wound.  He is directed to the emergency department for worsening infection where he was admitted.  MRI showed osteomyelitis.  Given this discussed transmetatarsal amputation.  Discussed alternatives, risks, complications.  No promises or guarantees given all questions answered to the best my ability.  Procedure in detail: Patient with both verbally and visually identified by myself, the nursing staff, and the anesthesia staff preoperatively.  He was transferred to the operating room via stretcher and placed in the open table in supine position.  A hip bump was placed in the left hip with blankets.  Well-padded calf tourniquet was applied making sure to pad all bony prominences but of note this was not inflated during the procedure.  The left lower extremities and scrubbed, prepped, draped in normal sterile  fashion.  Incision was planned for the transmetatarsal amputation with a plantar flap.  Incision was made with a 10 blade scalpel from skin to bone and hemostasis was achieved through Bov[place].  At this time the metatarsals were identified 1 through 5 and a key elevator was utilized to reflect the soft tissues.  A sagittal bone saw was utilized to resect the metatarsals 1 through 5.  The remaining bones appear to be viable.  In particular the first metatarsal remaining bone appeared to be hard in nature, white in color.  At this time the forefoot with a disarticulated with a plantar flap and passed off the table and sent to pathology.  All nonviable tissue was debrided with a 15 by scalpel.  Hemostasis was achieved with electrocautery as well as ligation with 3-0 Monocryl.  Incision was copiously irrigated with saline and hemostasis achieved.  The incision was then closed with 3-0 Prolene as well as skin staples.  Xeroform was applied followed by a dry sterile dressing.  He was awoken from anesthesia and found to tolerate the procedure well and complications.  He was transferred to PACU with vital signs stable and vascular status intact.  Postoperative plan:  Patient is remained inpatient with least 48 hours of IV antibiotics, plan changing the bandage on Friday.  He is nonweightbearing.

## 2020-08-05 NOTE — Progress Notes (Signed)
Patient seen in pre-op. Scheduled for left foot TMA. I went over the surgery with the patient and he understands what a TMA is and agrees to proceed. We discussed the surgery and post-op course. Discussed alternatives, risks, complications. Consent signed. Will proceed as scheduled.

## 2020-08-05 NOTE — Progress Notes (Signed)
[facility] PROGRESS NOTE    Progress Note  [name]  [id number] DOB: 12/21/1955 DOA: 08/03/2020 PCP: Ginger Organ., MD     Brief Narrative:   [name] is an 65 y.o. male past medical history significant for follicular lymphoma morbid obesity, chronic systolic heart failure atrial fibrillation on apixaban, diabetes mellitus type 2 essential hypertension, with a recent amputation of the left great toe last year presents with a left foot infection    Assessment/Plan:   Diabetic foot infection: With progressive 2-3 toes and cellulitis of the forefoot. He was started empirically on IV antibiotics. MRI of the foot showed acute osteomyelitis of second and third toe with bone marrow edema about 2 cm of the forefoot concerning for acute osteomyelitis. Started on IV vancomycin Flagyl and and aztreonam. ABIs pending, sed rate of 40  Podiatrist has been consulted for surgery today 08/05/2020.  Chronic systolic heart failure: With an EF to 45% appears to be compensated continue monitor.  Chronic atrial fibrillation: Rate controlled continue Coreg hold Eliquis for surgical procedure.  Controlled diabetes mellitus type 2: with an A1c of 6.3 previously continue sliding scale insulin is currently NPO.  Hyperlipidemia: Excellent continue Lipitor.  Central hypertension: Continue Coreg hold Cozaar.  Follicular lymphoma: In remission.  Obesity: BMI 52.   Unstageable left buttock ulcer present on admission: RN Pressure Injury Documentation: Pressure Injury 02/25/17 Unstageable - Full thickness tissue loss in which the base of the ulcer is covered by slough (yellow, tan, gray, green or brown) and/or eschar (tan, brown or black) in the wound bed. Patchy areas of partial thickness skin loss re (Active)  02/25/17 0700  Location: Buttocks  Location Orientation: Left  Staging: Unstageable - Full thickness tissue loss in which the base of the ulcer is covered by slough  (yellow, tan, gray, green or brown) and/or eschar (tan, brown or black) in the wound bed.  Wound Description (Comments): Patchy areas of partial thickness skin loss related to moisture associated skin damage and fungal rash; present on admission to rehab  Present on Admission: Yes      DVT prophylaxis: lovenox Family Communication:none Status is: Inpatient  Remains inpatient appropriate because:Hemodynamically unstable  Dispo: The patient is from: Home              Anticipated d/c is to: Home              Patient currently is not medically stable to d/c.   Difficult to place patient No        Code Status:     Code Status Orders  (From admission, onward)           Start     Ordered   08/04/20 1243  Full code  Continuous        08/04/20 1243           Code Status History     Date Active Date Inactive Code Status Order ID Comments User Context   03/22/2017 1535 04/06/2017 1824 Full Code [id number]  Flora Lipps Inpatient   03/22/2017 1535 03/22/2017 1535 Full Code [id number]  Flora Lipps Inpatient   02/16/2017 2121 03/22/2017 1532 Full Code [id number]  Arnell Asal, NP ED         IV Access:   Peripheral IV   Procedures and diagnostic studies:   MR FOOT LEFT WO CONTRAST  Result Date: 08/04/2020 CLINICAL DATA:  Osteomyelitis, foot. Chronic lower extremity lymphedema. Chronic nonhealing wound  of the left foot. Left great toe amputation in April of 2021 EXAM: MRI OF THE LEFT FOOT WITHOUT CONTRAST TECHNIQUE: Multiplanar, multisequence MR imaging of the left forefoot was performed. No intravenous contrast was administered. COMPARISON:  X-ray 08/03/2020 FINDINGS: Technical note: Despite efforts by the technologist and patient, motion artifact is present on today's exam and could not be eliminated. This reduces exam sensitivity and specificity. Bones/Joint/Cartilage Status post transmetatarsal amputation of the first ray at the level of the distal  metatarsal neck. There is bone marrow edema within the distal 1.9 cm of the residual first metatarsal (series 7, image 12). Subtle intermediate T1 signal in the marrow at this location without confluent low T1 signal change. Prominent bone marrow edema throughout the proximal, middle, and distal phalanx of the second toe with associated intermediate-low T1 marrow signal (series 6, images 10-11). There is lateral subluxation of the second toe at the DIP joint. Marrow edema is also present within the proximal, middle, and distal phalanx of the third toe with confluent low T1 signal change within the proximal and middle phalanx (series 6, images 13-14). Preserved T1 marrow signal of the remaining visualized forefoot. Ligaments Intact Lisfranc ligament. Collateral ligaments of the lesser MTP joints are intact. Muscles and Tendons Chronic denervation changes of the intrinsic foot musculature. Soft tissues Prominent circumferential soft tissue swelling with dorsal subcutaneous edema. Probable soft tissue ulceration at the second toe along its medial aspect. Soft tissue thickening/fibrosis overlies the residual first metatarsal resection margin. No organized fluid collection or abscess. IMPRESSION: 1. Findings most consistent with acute osteomyelitis of the second and third toes, as above. 2. Status post transmetatarsal amputation of the first ray. Bone marrow edema within the distal 1.9 cm of the residual first metatarsal may reflect reactive osteitis versus early acute osteomyelitis. 3. Prominent circumferential soft tissue swelling with dorsal subcutaneous edema. Probable soft tissue ulceration at the second toe along its medial aspect. No organized fluid collection or abscess. Electronically Signed   By: Davina Poke D.O.   On: 08/04/2020 15:04   DG Foot Complete Left  Result Date: 08/05/2020 Please see detailed radiograph report in office note.  DG Foot Complete Left  Result Date: 08/03/2020 CLINICAL DATA:   65 year old male with left foot infection. EXAM: LEFT FOOT - COMPLETE 3+ VIEW COMPARISON:  Earlier radiograph dated 08/03/2020. FINDINGS: Amputation of the first ray distal to the metatarsal head. No definite acute fracture. Evaluation however is limited due to osteopenia and overlying dressing. There is subluxed appearance of the distal interphalangeal joint of the second toe which is suboptimally evaluated on this radiograph. The distal phalanx of the second toe appears lateral to the middle phalanx. There is significant degenerative changes of the tarsometatarsal joints. There is diffuse soft tissue swelling and subcutaneous edema. Calcification of the skin noted in the distal leg. IMPRESSION: 1. No definite acute fracture. 2. Subluxation of the distal interphalangeal joint of the second toe, suboptimally evaluated on this radiograph. 3. Diffuse soft tissue swelling and subcutaneous edema. Electronically Signed   By: Anner Crete M.D.   On: 08/03/2020 21:28     Medical Consultants:   None.   Subjective:    [name] no complaints of pain control.  Objective:    Vitals:   08/04/20 1715 08/04/20 2025 08/04/20 2225 08/05/20 0457  BP: 128/84 113/68 (!) 140/98 (!) 142/83  Pulse: 64 68 (!) 55 93  Resp: '19 18 18 18  '$ Temp:  98.2 F (36.8 C) 98.3 F (36.8 C)  98.3 F (36.8 C)  TempSrc:   Oral Oral  SpO2: 100% 100% 97% 99%  Weight:      Height:       SpO2: 99 %   Intake/Output Summary (Last 24 hours) at 08/05/2020 1130 Last data filed at 08/05/2020 0954 Gross per 24 hour  Intake 100 ml  Output 375 ml  Net -275 ml   Filed Weights   08/03/20 2047  Weight: (!) 170 kg    Exam: General exam: In no acute distress. Respiratory system: Good air movement and clear to auscultation. Cardiovascular system: S1 & S2 heard, RRR Gastrointestinal system: Abdomen is nondistended, soft and nontender.  Extremities: Gangrenous second third and possibly fourth toe Psychiatry: Judgement and  insight appear normal. Mood & affect appropriate.    Data Reviewed:    Labs: Basic Metabolic Panel: Recent Labs  Lab 08/03/20 2044 08/05/20 0119  NA 134* 135  K 3.8 4.0  CL 101 107  CO2 23 21*  GLUCOSE 109* 134*  BUN 26* 18  CREATININE 1.08 0.83  CALCIUM 9.1 8.6*   GFR Estimated Creatinine Clearance: 142.1 mL/min (by C-G formula based on SCr of 0.83 mg/dL). Liver Function Tests: Recent Labs  Lab 08/03/20 2044  AST 22  ALT 21  ALKPHOS 134*  BILITOT 1.4*  PROT 7.6  ALBUMIN 3.2*   No results for input(s): LIPASE, AMYLASE in the last 168 hours. No results for input(s): AMMONIA in the last 168 hours. Coagulation profile No results for input(s): INR, PROTIME in the last 168 hours. COVID-19 Labs  Recent Labs    08/04/20 1239  CRP 10.0*    Lab Results  Component Value Date   SARSCOV2NAA NEGATIVE 08/05/2020   SARSCOV2NAA NEGATIVE 07/22/2020   SARSCOV2NAA NEGATIVE 07/06/2020   [place] NEGATIVE 05/03/2019    CBC: Recent Labs  Lab 08/03/20 2044 08/05/20 0119  WBC 10.6* 7.5  NEUTROABS 8.2*  --   HGB 13.6 12.7*  HCT 42.0 40.1  MCV 97.0 97.8  PLT 270 243   Cardiac Enzymes: No results for input(s): CKTOTAL, CKMB, CKMBINDEX, TROPONINI in the last 168 hours. BNP (last 3 results) No results for input(s): PROBNP in the last 8760 hours. CBG: Recent Labs  Lab 08/04/20 1722 08/04/20 2253 08/05/20 0452 08/05/20 0813  GLUCAP 109* 110* 115* 106*   D-Dimer: No results for input(s): DDIMER in the last 72 hours. Hgb A1c: Recent Labs    08/04/20 1239  HGBA1C 6.4*   Lipid Profile: No results for input(s): CHOL, HDL, LDLCALC, TRIG, CHOLHDL, LDLDIRECT in the last 72 hours. Thyroid function studies: No results for input(s): TSH, T4TOTAL, T3FREE, THYROIDAB in the last 72 hours.  Invalid input(s): FREET3 Anemia work up: No results for input(s): VITAMINB12, FOLATE, FERRITIN, TIBC, IRON, RETICCTPCT in the last 72 hours. Sepsis Labs: Recent Labs  Lab  08/03/20 2044 08/05/20 0119  WBC 10.6* 7.5  LATICACIDVEN 1.0  --    Microbiology Recent Results (from the past 240 hour(s))  Blood culture (routine x 2)     Status: None (Preliminary result)   Collection Time: 08/03/20  8:45 PM   Specimen: BLOOD  Result Value Ref Range Status   Specimen Description BLOOD LEFT ARM  Final   Special Requests   Final    BOTTLES DRAWN AEROBIC AND ANAEROBIC Blood Culture adequate volume   Culture   Final    NO GROWTH < 12 HOURS Performed at [facility], [place] [address], [place],  [address]    Report Status PENDING  Incomplete  Blood culture (routine x 2)     Status: None (Preliminary result)   Collection Time: 08/03/20  8:45 PM   Specimen: BLOOD  Result Value Ref Range Status   Specimen Description BLOOD LEFT ARM  Final   Special Requests   Final    BOTTLES DRAWN AEROBIC ONLY Blood Culture results may not be optimal due to an excessive volume of blood received in culture bottles   Culture   Final    NO GROWTH < 12 HOURS Performed at [facility], [address], [place], [place] [address]    Report Status PENDING  Incomplete  Surgical pcr screen     Status: Abnormal   Collection Time: 08/05/20 12:23 AM   Specimen: Nasopharyngeal Swab; Nasal Swab  Result Value Ref Range Status   MRSA, PCR NEGATIVE NEGATIVE Final   Staphylococcus aureus POSITIVE (A) NEGATIVE Final    Comment: (NOTE) The Xpert SA Assay (FDA approved for NASAL specimens in patients 47 years of age and older), is one component of a comprehensive surveillance program. It is not intended to diagnose infection nor to guide or monitor treatment. Performed at [facility], [place] [address] [place], [place] [address]   SARS CORONAVIRUS 2 (TAT 6-24 HRS) Nasopharyngeal Nasopharyngeal Swab     Status: None   Collection Time: 08/05/20 12:51 AM   Specimen: Nasopharyngeal Swab  Result Value Ref Range Status   SARS Coronavirus 2 NEGATIVE NEGATIVE Final    Comment:  (NOTE) SARS-CoV-2 target nucleic acids are NOT DETECTED.  The SARS-CoV-2 RNA is generally detectable in upper and lower respiratory specimens during the acute phase of infection. Negative results do not preclude SARS-CoV-2 infection, do not rule out co-infections with other pathogens, and should not be used as the sole basis for treatment or other patient management decisions. Negative results must be combined with clinical observations, patient history, and epidemiological information. The expected result is Negative.  Fact Sheet for Patients: SugarRoll.be  Fact Sheet for Healthcare Providers: https://www.woods-mathews.com/  This test is not yet approved or cleared by the [place] FDA and  has been authorized for detection and/or diagnosis of SARS-CoV-2 by FDA under an Emergency Use Authorization (EUA). This EUA will remain  in effect (meaning this test can be used) for the duration of the COVID-19 declaration under Se ction 564(b)(1) of the Act, 21 U.S.C. section 360bbb-3(b)(1), unless the authorization is terminated or revoked sooner.  Performed at [facility], [place] [address] [place], [place] [address]      Medications:    allopurinol  300 mg Oral Daily   atorvastatin  20 mg Oral Daily   carvedilol  12.5 mg Oral BID   Chlorhexidine Gluconate Cloth  6 each Topical Daily   docusate sodium  100 mg Oral BID   heparin injection (subcutaneous)  5,000 Units Subcutaneous Q8H   insulin aspart  0-15 Units Subcutaneous TID WC   insulin aspart  0-5 Units Subcutaneous QHS   losartan  50 mg Oral BID   metroNIDAZOLE  500 mg Oral Q8H   mupirocin ointment  1 application Nasal BID   pantoprazole  40 mg Oral Daily   saccharomyces boulardii  500 mg Oral Daily   sertraline  50 mg Oral Daily   Continuous Infusions:  sodium chloride 75 mL/hr at 08/05/20 0008   aztreonam 2 g (08/05/20 0500)   vancomycin 1,500 mg (08/04/20 2304)       LOS: 1 day   [name]  Aileen Fass  [facility]  08/05/2020, 11:30 AM

## 2020-08-05 NOTE — Progress Notes (Signed)
Pharmacy Antibiotic Note  [name] is a 65 y.o. male admitted on 08/03/2020 with  osteo .  Pharmacy has been consulted for Vanco dosing.  ID: Osteo of 2nd and 3rd toes.  - Afebrile. WBC WNL, Scr <1  Vanco 7/26>> Aztreonam 7/26 Flagyl 7/26>>  7/27: MRSA PCR: neg but SA positive 7/27: COVID neg 7/25: BC x 2>>  Plan: Change Vanco to '1750mg'$  IV q12hrs (AUC 497, Scr 0.83) Peak/trough at steady state  Aztreonam 2g IV q8h    Height: '5\' 11"'$  (180.3 cm) Weight: (!) 170 kg (374 lb 12.5 oz) IBW/kg (Calculated) : 75.3  Temp (24hrs), Avg:98.3 F (36.8 C), Min:98.2 F (36.8 C), Max:98.3 F (36.8 C)  Recent Labs  Lab 08/03/20 2044 08/05/20 0119  WBC 10.6* 7.5  CREATININE 1.08 0.83  LATICACIDVEN 1.0  --     Estimated Creatinine Clearance: 142.1 mL/min (by C-G formula based on SCr of 0.83 mg/dL).    Allergies  Allergen Reactions   Latex Hives and Swelling   Penicillins Swelling    Age 3 or 25 - pt states he had swelling all over and his lips got real big    Keyante Durio S. Alford Highland, PharmD, BCPS Clinical Staff Pharmacist [email or web address]  Wayland Salinas 08/05/2020 10:44 AM

## 2020-08-05 NOTE — Anesthesia Postprocedure Evaluation (Signed)
Anesthesia Post Note  Patient: NOHLAN BURDIN  Procedure(s) Performed: TRANSMETATARSAL AMPUTATION (Left: Toe)     Patient location during evaluation: PACU Anesthesia Type: MAC Level of consciousness: awake Pain management: pain level controlled Vital Signs Assessment: post-procedure vital signs reviewed and stable Respiratory status: spontaneous breathing, nonlabored ventilation, respiratory function stable and patient connected to nasal cannula oxygen Cardiovascular status: stable and blood pressure returned to baseline Postop Assessment: no apparent nausea or vomiting Anesthetic complications: no   No notable events documented.  Last Vitals:  Vitals:   08/05/20 1752 08/05/20 2059  BP: (!) 99/55 (!) 107/51  Pulse: (!) 54 (!) 55  Resp: 15 17  Temp: 36.6 C 36.5 C  SpO2: 99% 96%    Last Pain:  Vitals:   08/05/20 2059  TempSrc: Oral  PainSc:                  Karyl Kinnier Nysa Sarin

## 2020-08-05 NOTE — Transfer of Care (Signed)
Immediate Anesthesia Transfer of Care Note  Patient: [name]  Procedure(s) Performed: TRANSMETATARSAL AMPUTATION (Left: Toe)  Patient Location: PACU  Anesthesia Type:MAC  Level of Consciousness: awake, alert  and oriented  Airway & Oxygen Therapy: Patient Spontanous Breathing  Post-op Assessment: Report given to RN and Post -op Vital signs reviewed and stable  Post vital signs: Reviewed and stable  Last Vitals:  Vitals Value Taken Time  BP 97/58 08/05/20 1656  Temp    Pulse 67 08/05/20 1656  Resp 22 08/05/20 1656  SpO2 92 % 08/05/20 1656  Vitals shown include unvalidated device data.  Last Pain:  Vitals:   08/05/20 1503  TempSrc:   PainSc: 6          Complications: No notable events documented.

## 2020-08-05 NOTE — Anesthesia Preprocedure Evaluation (Addendum)
Anesthesia Evaluation  Patient identified by MRN, date of birth, ID band Patient awake    Reviewed: Allergy & Precautions, NPO status , Patient's Chart, lab work & pertinent test results, reviewed documented beta blocker date and time   Airway Mallampati: III  TM Distance: >3 FB Neck ROM: Full    Dental  (+) Missing   Pulmonary sleep apnea and Continuous Positive Airway Pressure Ventilation , former smoker,  Quit smoking 2011, 10 pack year history Hx trach in 2019 2/2 flu pna   Pulmonary exam normal breath sounds clear to auscultation       Cardiovascular hypertension, Pt. on medications and Pt. on home beta blockers pulmonary hypertension (mod pHTN)+ CAD and +CHF (LVEF 45-50%)  Normal cardiovascular exam+ dysrhythmias (eliquis) Atrial Fibrillation  Rhythm:Regular Rate:Normal  Echo 2020: 1. Technically difficult study. Left ventricular ejection fraction, by  visual estimation, is grossly 45 to 50%. There appears to be mildly  decreased global systolic function. Images are not sufficient to assess  for regional wall motion abnormalities.  There is mildly increased left ventricular hypertrophy.  2. Left ventricular diastolic parameters are indeterminate.  3. Global right ventricle has mildly reduced systolic function.The right  ventricular size is mildly enlarged.  4. Left atrial size was mildly dilated.  5. Right atrial size was normal.  6. The mitral valve is normal in structure. Trace mitral valve  regurgitation.  7. The tricuspid valve is not well visualized. Tricuspid valve  regurgitation is trivial.  8. The aortic valve is tricuspid. Aortic valve regurgitation is not  visualized.  9. The pulmonic valve was not well visualized. Pulmonic valve  regurgitation is not visualized.  10. There is dilatation of the aortic root measuring 40 mm.  11. The inferior vena cava is dilated in size with <50% respiratory   variability, suggesting right atrial pressure of 15 mmHg.  12. The tricuspid regurgitant velocity is 2.79 m/s, and with an assumed  right atrial pressure of 15 mmHg, the estimated right ventricular systolic  pressure is moderately elevated at 46.1 mmHg.    Neuro/Psych PSYCHIATRIC DISORDERS Anxiety Depression  Neuromuscular disease    GI/Hepatic Neg liver ROS, GERD  Medicated and Controlled,  Endo/Other  diabetes (diet controlled)Morbid obesityBMI 52 a1c 6.4  Renal/GU negative Renal ROS     Musculoskeletal  (+) Arthritis , Osteoarthritis,  LLE osteo Gout   Abdominal (+) + obese,   Peds  Hematology hct 40.1 Hx lymphoma- in remission   Anesthesia Other Findings osteomyelitis  Reproductive/Obstetrics                           Anesthesia Physical Anesthesia Plan  ASA: 3  Anesthesia Plan: MAC   Post-op Pain Management:    Induction: Intravenous  PONV Risk Score and Plan: 1 and Ondansetron, Dexamethasone, Propofol infusion, Midazolam and Treatment may vary due to age or medical condition  Airway Management Planned: Simple Face Mask  Additional Equipment:   Intra-op Plan:   Post-operative Plan:   Informed Consent: I have reviewed the patients History and Physical, chart, labs and discussed the procedure including the risks, benefits and alternatives for the proposed anesthesia with the patient or authorized representative who has indicated his/her understanding and acceptance.     Dental advisory given  Plan Discussed with: CRNA and Surgeon  Anesthesia Plan Comments:       Anesthesia Quick Evaluation

## 2020-08-05 NOTE — Progress Notes (Signed)
Orthopedic Tech Progress Note Patient Details:  [name] 1955-02-06 [id number]  Was not able to complete order with CAM WALKER. Reaching out to MD to asked if he would like something better fitting for patient.   Patient ID: [name], male   DOB: 11-19-1955, 66 y.o.   MRN: [id number]  Janit Pagan 08/05/2020, 6:40 PM

## 2020-08-06 ENCOUNTER — Encounter (HOSPITAL_COMMUNITY): Payer: Self-pay | Admitting: Podiatry

## 2020-08-06 ENCOUNTER — Inpatient Hospital Stay (HOSPITAL_COMMUNITY): Payer: Medicare Other

## 2020-08-06 ENCOUNTER — Other Ambulatory Visit (HOSPITAL_COMMUNITY): Payer: Self-pay | Admitting: Internal Medicine

## 2020-08-06 DIAGNOSIS — L0390 Cellulitis, unspecified: Secondary | ICD-10-CM | POA: Diagnosis not present

## 2020-08-06 DIAGNOSIS — I5022 Chronic systolic (congestive) heart failure: Secondary | ICD-10-CM | POA: Diagnosis not present

## 2020-08-06 DIAGNOSIS — C8212 Follicular lymphoma grade II, intrathoracic lymph nodes: Secondary | ICD-10-CM | POA: Diagnosis not present

## 2020-08-06 DIAGNOSIS — I4821 Permanent atrial fibrillation: Secondary | ICD-10-CM | POA: Diagnosis not present

## 2020-08-06 DIAGNOSIS — I96 Gangrene, not elsewhere classified: Secondary | ICD-10-CM | POA: Diagnosis not present

## 2020-08-06 LAB — GLUCOSE, CAPILLARY
Glucose-Capillary: 88 mg/dL (ref 70–99)
Glucose-Capillary: 105 mg/dL — ABNORMAL HIGH (ref 70–99)
Glucose-Capillary: 91 mg/dL (ref 70–99)
Glucose-Capillary: 141 mg/dL — ABNORMAL HIGH (ref 70–99)

## 2020-08-06 MED ORDER — METHOCARBAMOL 500 MG PO TABS
250.0000 mg | ORAL_TABLET | Freq: Once | ORAL | Status: AC
  Administered 2020-08-06: 250 mg via ORAL
  Filled 2020-08-06: qty 1

## 2020-08-06 MED ORDER — ADULT MULTIVITAMIN W/MINERALS CH
1.0000 | ORAL_TABLET | Freq: Every day | ORAL | Status: DC
  Administered 2020-08-06 – 2020-08-11 (×6): 1 via ORAL
  Filled 2020-08-06 (×6): qty 1

## 2020-08-06 MED ORDER — JUVEN PO PACK
1.0000 | PACK | Freq: Two times a day (BID) | ORAL | Status: DC
  Administered 2020-08-06 – 2020-08-10 (×8): 1 via ORAL
  Filled 2020-08-06 (×10): qty 1

## 2020-08-06 MED ORDER — SODIUM CHLORIDE 0.9 % IV SOLN
3.0000 g | Freq: Four times a day (QID) | INTRAVENOUS | Status: DC
  Administered 2020-08-06 – 2020-08-11 (×20): 3 g via INTRAVENOUS
  Filled 2020-08-06 (×3): qty 3
  Filled 2020-08-06: qty 8
  Filled 2020-08-06 (×4): qty 3
  Filled 2020-08-06: qty 8
  Filled 2020-08-06 (×2): qty 3
  Filled 2020-08-06 (×2): qty 8
  Filled 2020-08-06 (×2): qty 3
  Filled 2020-08-06: qty 8
  Filled 2020-08-06 (×3): qty 3
  Filled 2020-08-06: qty 8
  Filled 2020-08-06 (×2): qty 3

## 2020-08-06 MED ORDER — OXYCODONE-ACETAMINOPHEN 5-325 MG PO TABS
1.0000 | ORAL_TABLET | ORAL | Status: DC | PRN
  Administered 2020-08-06 – 2020-08-11 (×13): 2 via ORAL
  Filled 2020-08-06 (×13): qty 2

## 2020-08-06 MED ORDER — APIXABAN 5 MG PO TABS
5.0000 mg | ORAL_TABLET | Freq: Two times a day (BID) | ORAL | Status: DC
  Administered 2020-08-06 – 2020-08-11 (×11): 5 mg via ORAL
  Filled 2020-08-06 (×11): qty 1

## 2020-08-06 MED ORDER — POLYETHYLENE GLYCOL 3350 17 G PO PACK
17.0000 g | PACK | Freq: Two times a day (BID) | ORAL | Status: AC
Start: 2020-08-06 — End: 2020-08-08
  Administered 2020-08-06 – 2020-08-07 (×2): 17 g via ORAL
  Filled 2020-08-06 (×2): qty 1

## 2020-08-06 NOTE — Discharge Instructions (Signed)

## 2020-08-06 NOTE — Progress Notes (Signed)
Initial Nutrition Assessment  DOCUMENTATION CODES:   Morbid obesity  INTERVENTION:   Juven BID, each packet provides 80 calories, 8 grams of carbohydrate, 2.5  grams of protein (collagen), 7 grams of L-arginine and 7 grams of L-glutamine; supplement contains CaHMB, Vitamins C, E, B12 and Zinc to promote wound healing  MVI with Minerals daily   NUTRITION DIAGNOSIS:   Increased nutrient needs related to wound healing as evidenced by estimated needs.  GOAL:   Patient will meet greater than or equal to 90% of their needs  MONITOR:   PO intake, Supplement acceptance, Labs, Weight trends  REASON FOR ASSESSMENT:   Consult Wound healing  ASSESSMENT:   65 yo male admitted with left foot infection requiring amputation. PMH DM, HTN, CHF, follicular lymphoma  [id number] Amputation of 2nd and 3rd toe and metatarsals  Pt eating 100% of meals, appetite good  Lab Results  Component Value Date   HGBA1C 6.4 (H) 08/04/2020  DM well controlled per recent HgbA1c; pt will need to continue good control to optimize wound healing  Weight relatively stable per weight encounters  Labs: CBGs 95-129 Meds: ss novolog, florastor, miralax, colace   Diet Order:   Diet Order             Diet heart healthy/carb modified Room service appropriate? Yes; Fluid consistency: Thin  Diet effective now                   EDUCATION NEEDS:   Education needs have been addressed  Skin:  Skin Assessment: Skin Integrity Issues: Skin Integrity Issues:: Diabetic Ulcer Diabetic Ulcer: foot requiring amputation  Last BM:  7/25  Height:   Ht Readings from Last 1 Encounters:  08/05/20 '5\' 11"'$  (1.803 m)    Weight:   Wt Readings from Last 1 Encounters:  08/05/20 (!) 155.6 kg     BMI:  Body mass index is 47.84 kg/m.  Estimated Nutritional Needs:   Kcal:  2000-2200 kcals  Protein:  120-135 g  Fluid:  >/= 2 L   Kerman Passey MS, RDN, LDN, CNSC Registered Dietitian III Clinical  Nutrition RD Pager and On-Call Pager Number Located in [place]

## 2020-08-06 NOTE — Progress Notes (Signed)
ABI has been completed.  Results can be found under chart review under CV PROC. 08/06/2020 3:49 PM Algie Cales RVT, RDMS

## 2020-08-06 NOTE — Progress Notes (Signed)
[facility] PROGRESS NOTE    Progress Note  [name]  [id number] DOB: 1955/02/24 DOA: 08/03/2020 PCP: Ginger Organ., MD     Brief Narrative:   [name] is an 65 y.o. male past medical history significant for follicular lymphoma morbid obesity, chronic systolic heart failure atrial fibrillation on apixaban, diabetes mellitus type 2 essential hypertension, with a recent amputation of the left great toe last year presents with a left foot infection    Assessment/Plan:   Diabetic foot infection with osteomyelitis of second and third toe: Status post amputation second and third toe and metatarsals on 08/06/2018 Continue IV Vanco aztreonam and Flagyl ABI still pending.   Is recommended to continue IV antibiotics and to remain nonweightbearing. Podiatry to dictate which antibiotic to use and for how long orally as an outpatient.  Chronic systolic heart failure: With an EF to 45% appears to be compensated continue monitor.  Chronic atrial fibrillation: Rate controlled Coreg, resume Eliquis.  Controlled diabetes mellitus type 2: with an A1c of 6.3 previously continue sliding scale insulin is currently NPO.  Hyperlipidemia:  Excellent continue Lipitor.  Essential hypertension: Continue Coreg hold Cozaar.  Follicular lymphoma: In remission.  Obesity: BMI 52.   Unstageable left buttock ulcer present on admission: RN Pressure Injury Documentation: Pressure Injury 02/25/17 Unstageable - Full thickness tissue loss in which the base of the ulcer is covered by slough (yellow, tan, gray, green or brown) and/or eschar (tan, brown or black) in the wound bed. Patchy areas of partial thickness skin loss re (Active)  02/25/17 0700  Location: Buttocks  Location Orientation: Left  Staging: Unstageable - Full thickness tissue loss in which the base of the ulcer is covered by slough (yellow, tan, gray, green or brown) and/or eschar (tan, brown or black) in the wound  bed.  Wound Description (Comments): Patchy areas of partial thickness skin loss related to moisture associated skin damage and fungal rash; present on admission to rehab  Present on Admission: Yes      DVT prophylaxis: lovenox Family Communication:none Status is: Inpatient  Remains inpatient appropriate because:Hemodynamically unstable  Dispo: The patient is from: Home              Anticipated d/c is to: Home              Patient currently is not medically stable to d/c.   Difficult to place patient No     Code Status:     Code Status Orders  (From admission, onward)           Start     Ordered   08/04/20 1243  Full code  Continuous        08/04/20 1243           Code Status History     Date Active Date Inactive Code Status Order ID Comments User Context   03/22/2017 1535 04/06/2017 1824 Full Code [id number]  Flora Lipps Inpatient   03/22/2017 1535 03/22/2017 1535 Full Code [id number]  Flora Lipps Inpatient   02/16/2017 2121 03/22/2017 1532 Full Code [id number]  Arnell Asal, NP ED         IV Access:   Peripheral IV   Procedures and diagnostic studies:   MR FOOT LEFT WO CONTRAST  Result Date: 08/04/2020 CLINICAL DATA:  Osteomyelitis, foot. Chronic lower extremity lymphedema. Chronic nonhealing wound of the left foot. Left great toe amputation in April of 2021 EXAM: MRI OF THE LEFT  FOOT WITHOUT CONTRAST TECHNIQUE: Multiplanar, multisequence MR imaging of the left forefoot was performed. No intravenous contrast was administered. COMPARISON:  X-ray 08/03/2020 FINDINGS: Technical note: Despite efforts by the technologist and patient, motion artifact is present on today's exam and could not be eliminated. This reduces exam sensitivity and specificity. Bones/Joint/Cartilage Status post transmetatarsal amputation of the first ray at the level of the distal metatarsal neck. There is bone marrow edema within the distal 1.9 cm of the residual first  metatarsal (series 7, image 12). Subtle intermediate T1 signal in the marrow at this location without confluent low T1 signal change. Prominent bone marrow edema throughout the proximal, middle, and distal phalanx of the second toe with associated intermediate-low T1 marrow signal (series 6, images 10-11). There is lateral subluxation of the second toe at the DIP joint. Marrow edema is also present within the proximal, middle, and distal phalanx of the third toe with confluent low T1 signal change within the proximal and middle phalanx (series 6, images 13-14). Preserved T1 marrow signal of the remaining visualized forefoot. Ligaments Intact Lisfranc ligament. Collateral ligaments of the lesser MTP joints are intact. Muscles and Tendons Chronic denervation changes of the intrinsic foot musculature. Soft tissues Prominent circumferential soft tissue swelling with dorsal subcutaneous edema. Probable soft tissue ulceration at the second toe along its medial aspect. Soft tissue thickening/fibrosis overlies the residual first metatarsal resection margin. No organized fluid collection or abscess. IMPRESSION: 1. Findings most consistent with acute osteomyelitis of the second and third toes, as above. 2. Status post transmetatarsal amputation of the first ray. Bone marrow edema within the distal 1.9 cm of the residual first metatarsal may reflect reactive osteitis versus early acute osteomyelitis. 3. Prominent circumferential soft tissue swelling with dorsal subcutaneous edema. Probable soft tissue ulceration at the second toe along its medial aspect. No organized fluid collection or abscess. Electronically Signed   By: Davina Poke D.O.   On: 08/04/2020 15:04   DG Foot Complete Left  Result Date: 08/05/2020 CLINICAL DATA:  65 year old male status post amputation of the distal foot. EXAM: LEFT FOOT - COMPLETE 3+ VIEW COMPARISON:  Left foot radiograph dated 08/03/2020. FINDINGS: Transmetatarsal amputation of the  foot. The surgical edges appear sharp. There is no acute fracture or dislocation. The bones are osteopenic. There is extensive degenerative and Charcot changes of the midfoot and tarsometatarsal joints. There is diffuse soft tissue swelling. Subcutaneous calcification of the distal calf may be related to chronic venous stasis or underlying connective tissue disorder. Skin staples noted over the stump. IMPRESSION: Transmetatarsal amputation of the foot. Electronically Signed   By: Anner Crete M.D.   On: 08/05/2020 19:42   DG Foot Complete Left  Result Date: 08/05/2020 Please see detailed radiograph report in office note.    Medical Consultants:   None.   Subjective:    ANDRIY PURSER relates his pain is controlled has not had a bowel movement in 3 days.  Objective:    Vitals:   08/05/20 2059 08/06/20 0014 08/06/20 0439 08/06/20 0700  BP: (!) 107/51 (!) 91/55 96/62 108/63  Pulse: (!) 55 (!) 58 (!) 52 (!) 50  Resp: '17 18 18 18  '$ Temp: 97.7 F (36.5 C) 97.6 F (36.4 C) (!) 97.3 F (36.3 C) 97.8 F (36.6 C)  TempSrc: Oral  Oral Oral  SpO2: 96% 98% 97% 98%  Weight:      Height:       SpO2: 98 %   Intake/Output Summary (Last 24 hours) at  08/06/2020 0921 Last data filed at 08/06/2020 0626 Gross per 24 hour  Intake 3375.4 ml  Output 1525 ml  Net 1850.4 ml    Filed Weights   08/03/20 2047 08/05/20 1449  Weight: (!) 170 kg (!) 155.6 kg    Exam: General exam: In no acute distress. Respiratory system: Good air movement and clear to auscultation. Cardiovascular system: S1 & S2 heard, RRR. No JVD. Gastrointestinal system: Abdomen is nondistended, soft and nontender.  Extremities: No pedal edema. Skin: No rashes, lesions or ulcers  Data Reviewed:    Labs: Basic Metabolic Panel: Recent Labs  Lab 08/03/20 2044 08/05/20 0119  NA 134* 135  K 3.8 4.0  CL 101 107  CO2 23 21*  GLUCOSE 109* 134*  BUN 26* 18  CREATININE 1.08 0.83  CALCIUM 9.1 8.6*     GFR Estimated Creatinine Clearance: 134.8 mL/min (by C-G formula based on SCr of 0.83 mg/dL). Liver Function Tests: Recent Labs  Lab 08/03/20 2044  AST 22  ALT 21  ALKPHOS 134*  BILITOT 1.4*  PROT 7.6  ALBUMIN 3.2*    No results for input(s): LIPASE, AMYLASE in the last 168 hours. No results for input(s): AMMONIA in the last 168 hours. Coagulation profile No results for input(s): INR, PROTIME in the last 168 hours. COVID-19 Labs  Recent Labs    08/04/20 1239  CRP 10.0*     Lab Results  Component Value Date   SARSCOV2NAA NEGATIVE 08/05/2020   SARSCOV2NAA NEGATIVE 07/22/2020   SARSCOV2NAA NEGATIVE 07/06/2020   [place] NEGATIVE 05/03/2019    CBC: Recent Labs  Lab 08/03/20 2044 08/05/20 0119  WBC 10.6* 7.5  NEUTROABS 8.2*  --   HGB 13.6 12.7*  HCT 42.0 40.1  MCV 97.0 97.8  PLT 270 243    Cardiac Enzymes: No results for input(s): CKTOTAL, CKMB, CKMBINDEX, TROPONINI in the last 168 hours. BNP (last 3 results) No results for input(s): PROBNP in the last 8760 hours. CBG: Recent Labs  Lab 08/05/20 1506 08/05/20 1654 08/05/20 1812 08/05/20 2059 08/06/20 0743  GLUCAP 102* 96 95 129* 88    D-Dimer: No results for input(s): DDIMER in the last 72 hours. Hgb A1c: Recent Labs    08/04/20 1239  HGBA1C 6.4*    Lipid Profile: No results for input(s): CHOL, HDL, LDLCALC, TRIG, CHOLHDL, LDLDIRECT in the last 72 hours. Thyroid function studies: No results for input(s): TSH, T4TOTAL, T3FREE, THYROIDAB in the last 72 hours.  Invalid input(s): FREET3 Anemia work up: No results for input(s): VITAMINB12, FOLATE, FERRITIN, TIBC, IRON, RETICCTPCT in the last 72 hours. Sepsis Labs: Recent Labs  Lab 08/03/20 2044 08/05/20 0119  WBC 10.6* 7.5  LATICACIDVEN 1.0  --     Microbiology Recent Results (from the past 240 hour(s))  Blood culture (routine x 2)     Status: None (Preliminary result)   Collection Time: 08/03/20  8:45 PM   Specimen: BLOOD   Result Value Ref Range Status   Specimen Description BLOOD LEFT ARM  Final   Special Requests   Final    BOTTLES DRAWN AEROBIC AND ANAEROBIC Blood Culture adequate volume   Culture   Final    NO GROWTH 2 DAYS Performed at [facility], [address] [place], [place] [address]    Report Status PENDING  Incomplete  Blood culture (routine x 2)     Status: None (Preliminary result)   Collection Time: 08/03/20  8:45 PM   Specimen: BLOOD  Result Value Ref Range Status  Specimen Description BLOOD LEFT ARM  Final   Special Requests   Final    BOTTLES DRAWN AEROBIC ONLY Blood Culture results may not be optimal due to an excessive volume of blood received in culture bottles   Culture   Final    NO GROWTH 2 DAYS Performed at [facility], [place] [address], [place], [place] [address]    Report Status PENDING  Incomplete  Surgical pcr screen     Status: Abnormal   Collection Time: 08/05/20 12:23 AM   Specimen: Nasopharyngeal Swab; Nasal Swab  Result Value Ref Range Status   MRSA, PCR NEGATIVE NEGATIVE Final   Staphylococcus aureus POSITIVE (A) NEGATIVE Final    Comment: (NOTE) The Xpert SA Assay (FDA approved for NASAL specimens in patients 68 years of age and older), is one component of a comprehensive surveillance program. It is not intended to diagnose infection nor to guide or monitor treatment. Performed at [facility], [place] [address] [place], [place] [address]   SARS CORONAVIRUS 2 (TAT 6-24 HRS) Nasopharyngeal Nasopharyngeal Swab     Status: None   Collection Time: 08/05/20 12:51 AM   Specimen: Nasopharyngeal Swab  Result Value Ref Range Status   SARS Coronavirus 2 NEGATIVE NEGATIVE Final    Comment: (NOTE) SARS-CoV-2 target nucleic acids are NOT DETECTED.  The SARS-CoV-2 RNA is generally detectable in upper and lower respiratory specimens during the acute phase of infection. Negative results do not preclude SARS-CoV-2 infection, do not rule  out co-infections with other pathogens, and should not be used as the sole basis for treatment or other patient management decisions. Negative results must be combined with clinical observations, patient history, and epidemiological information. The expected result is Negative.  Fact Sheet for Patients: SugarRoll.be  Fact Sheet for Healthcare Providers: https://www.woods-mathews.com/  This test is not yet approved or cleared by the [place] FDA and  has been authorized for detection and/or diagnosis of SARS-CoV-2 by FDA under an Emergency Use Authorization (EUA). This EUA will remain  in effect (meaning this test can be used) for the duration of the COVID-19 declaration under Se ction 564(b)(1) of the Act, 21 U.S.C. section 360bbb-3(b)(1), unless the authorization is terminated or revoked sooner.  Performed at [facility], [place] [address], [place], [place] [address]      Medications:    allopurinol  300 mg Oral Daily   atorvastatin  20 mg Oral Daily   carvedilol  12.5 mg Oral BID   Chlorhexidine Gluconate Cloth  6 each Topical Daily   docusate sodium  100 mg Oral BID   heparin injection (subcutaneous)  5,000 Units Subcutaneous Q8H   insulin aspart  0-15 Units Subcutaneous TID WC   insulin aspart  0-5 Units Subcutaneous QHS   losartan  50 mg Oral BID   metroNIDAZOLE  500 mg Oral Q8H   mupirocin ointment  1 application Nasal BID   pantoprazole  40 mg Oral Daily   saccharomyces boulardii  500 mg Oral Daily   sertraline  50 mg Oral Daily   Continuous Infusions:  sodium chloride 75 mL/hr at 08/05/20 1227   aztreonam 2 g (08/06/20 0538)   vancomycin 1,500 mg (08/05/20 2156)      LOS: 2 days   Charlynne Cousins  [facility]  08/06/2020, 9:21 AM

## 2020-08-06 NOTE — Progress Notes (Signed)
Subjective: [name] is a 65 y.o. male patient seen at bedside, resting comfortably in no acute distress s/p day #1 Left Transmetatarsal amputation. Patient admits pain at surgical site 7/10, denies calf pain, denies headache, chest pain, shortness of breath, nausea, vomitting, denies loss of appetite, denies problems with voiding. Reports that nurse is going to give him a stool softener. No other issues noted.   Patient Active Problem List   Diagnosis Date Noted   Gangrene ([place]) 08/04/2020   Permanent atrial fibrillation ([place]) [id number]   Chronic systolic HF (heart failure) ([place]) 07/21/2019   Cough 07/12/2019   Educated about COVID-19 virus infection 05/12/2019   Non-pressure chronic ulcer of other part of left foot with unspecified severity ([place]) 04/11/2019   Pain in limb 04/11/2019   Adverse effect of other vaccines and biological substances, initial encounter 04/10/2019   COVID-19 virus infection 08/07/2018   Recurrent pneumonia 02/26/2018   Port-A-Cath in place 01/08/2018   History of colonic polyps 12/11/2017   Thrombophilia ([place]) 10/30/2017   Heart failure ([place]) 07/04/2017   Dyspnea on exertion 06/19/2017   Chronic anticoagulation 05/22/2017   Cardiomyopathy ([place]) 05/22/2017   Hypotension 05/22/2017   RBBB 05/22/2017   UTI due to extended-spectrum beta lactamase (ESBL) producing Escherichia coli 04/06/2017   Debility    Wound infection    OSA (obstructive sleep apnea)    Acute systolic heart failure ([place])    Morbid obesity ([place])    Diabetes mellitus, new onset ([place])    Dysuria    Acute respiratory distress syndrome (ARDS) ([place])    Influenza A with pneumonia    Anxiety    Current moderate episode of major depressive disorder without prior episode ([place])    Obesity, Class III, BMI 40-49.9 (morbid obesity) ([place])    Community acquired pneumonia    Critical illness myopathy    Dysphagia    Non-Hodgkin's lymphoma ([place])    Benign essential HTN    Leukocytosis     Hypokalemia    Tracheostomy status ([place])    Hypoxic    Pressure injury of skin 02/28/2017   Status post tracheostomy ([place])    Follicular lymphoma grade II of intrathoracic lymph nodes ([place]) 05/02/2016   Portacath in place 05/04/2015   Localized, primary osteoarthritis of hand 09/08/2014   Atherosclerotic heart disease of native coronary artery without angina pectoris 08/26/2013   Tobacco user 07/30/2012   Gout attack 01/16/2012   Psoriasis [id number]   Follicular lymphoma grade II ([place]) 12/16/2010   Personal history of non-Hodgkin lymphomas 09/20/2010   Peripheral venous insufficiency 11/16/2009   Encounter for general adult medical examination without abnormal findings 06/16/2009   Hyperlipidemia 06/16/2009   Impaired fasting glucose 06/16/2009   Proteinuria 06/16/2009   Varicose veins of other specified sites 06/16/2009     Current Facility-Administered Medications:    0.9 %  sodium chloride infusion, , Intravenous, Continuous, Karmen Bongo, MD, Last Rate: 75 mL/hr at 08/05/20 1227, New Bag at 08/05/20 1227   acetaminophen (TYLENOL) tablet 650 mg, 650 mg, Oral, Q6H PRN **OR** acetaminophen (TYLENOL) suppository 650 mg, 650 mg, Rectal, Q6H PRN, Karmen Bongo, MD   allopurinol (ZYLOPRIM) tablet 300 mg, 300 mg, Oral, Daily, Karmen Bongo, MD, 300 mg at 08/05/20 0902   atorvastatin (LIPITOR) tablet 20 mg, 20 mg, Oral, Daily, Karmen Bongo, MD, 20 mg at 08/05/20 0901   aztreonam (AZACTAM) 2 g in sodium chloride 0.9 % 100 mL IVPB, 2 g, Intravenous, Q8H, Karmen Bongo, MD, Last Rate: 200 mL/hr at  08/06/20 0538, 2 g at 08/06/20 0538   bisacodyl (DULCOLAX) EC tablet 5 mg, 5 mg, Oral, Daily PRN, Karmen Bongo, MD   carvedilol (COREG) tablet 12.5 mg, 12.5 mg, Oral, BID, Karmen Bongo, MD, 12.5 mg at 08/05/20 2150   Chlorhexidine Gluconate Cloth 2 % PADS 6 each, 6 each, Topical, Daily, Karmen Bongo, MD, 6 each at 08/05/20 0903   docusate sodium (COLACE) capsule 100 mg, 100 mg,  Oral, BID, Karmen Bongo, MD, 100 mg at 08/05/20 2151   heparin injection 5,000 Units, 5,000 Units, Subcutaneous, Q8H, Heloise Purpura, RPH, 5,000 Units at 08/06/20 0443   hydrALAZINE (APRESOLINE) injection 5 mg, 5 mg, Intravenous, Q4H PRN, Karmen Bongo, MD   HYDROcodone-acetaminophen (NORCO/VICODIN) 5-325 MG per tablet 1-2 tablet, 1-2 tablet, Oral, Q4H PRN, Karmen Bongo, MD, 2 tablet at 08/06/20 0201   insulin aspart (novoLOG) injection 0-15 Units, 0-15 Units, Subcutaneous, TID WC, Karmen Bongo, MD   insulin aspart (novoLOG) injection 0-5 Units, 0-5 Units, Subcutaneous, QHS, Karmen Bongo, MD   losartan (COZAAR) tablet 50 mg, 50 mg, Oral, BID, Charlynne Cousins, MD   metroNIDAZOLE (FLAGYL) tablet 500 mg, 500 mg, Oral, Q8H, Karmen Bongo, MD, 500 mg at 08/06/20 0443   morphine 2 MG/ML injection 2 mg, 2 mg, Intravenous, Q2H PRN, Karmen Bongo, MD, 2 mg at 08/06/20 0310   mupirocin ointment (BACTROBAN) 2 % 1 application, 1 application, Nasal, BID, Karmen Bongo, MD, 1 application at [id number]   ondansetron (ZOFRAN) tablet 4 mg, 4 mg, Oral, Q6H PRN **OR** ondansetron (ZOFRAN) injection 4 mg, 4 mg, Intravenous, Q6H PRN, Karmen Bongo, MD   pantoprazole (PROTONIX) EC tablet 40 mg, 40 mg, Oral, Daily, Karmen Bongo, MD, 40 mg at 08/05/20 0902   polyethylene glycol (MIRALAX / GLYCOLAX) packet 17 g, 17 g, Oral, Daily PRN, Karmen Bongo, MD   saccharomyces boulardii (FLORASTOR) capsule 500 mg, 500 mg, Oral, Daily, Karmen Bongo, MD, 500 mg at 08/05/20 0902   sertraline (ZOLOFT) tablet 50 mg, 50 mg, Oral, Daily, Karmen Bongo, MD, 50 mg at 08/05/20 0902   vancomycin (VANCOREADY) IVPB 1500 mg/300 mL, 1,500 mg, Intravenous, Q12H, Karmen Bongo, MD, Last Rate: 150 mL/hr at 08/05/20 2156, 1,500 mg at 08/05/20 2156  Facility-Administered Medications Ordered in Other Encounters:    sodium chloride 0.9 % injection 10 mL, 10 mL, Intravenous, PRN, Curt Bears, MD, 10 mL  at 05/04/15 1529  Allergies  Allergen Reactions   Latex Hives and Swelling   Penicillins Swelling    Age 24 or 78 - pt states he had swelling all over and his lips got real big     Objective: Today's Vitals   08/06/20 0014 08/06/20 0201 08/06/20 0310 08/06/20 0439  BP: (!) 91/55   96/62  Pulse: (!) 58   (!) 52  Resp: 18   18  Temp: 97.6 F (36.4 C)   (!) 97.3 F (36.3 C)  TempSrc:    Oral  SpO2: 98%   97%  Weight:      Height:      PainSc:  7  8      General: No acute distress  Left Lower extremity: Dressing to left foot clean, dry, intact. No strikethrough noted, No calf pain. Range of motion excluding surgical site within normal limits with no pain to calf.     Post Op Xray, Left foot: IMPRESSION: Transmetatarsal amputation of the foot.     Assessment and Plan:  Problem List Items Addressed This Visit  Other   * (Principal) Gangrene ([place])   Other Visit Diagnoses     Gangrene of foot ([place])    -  Primary   Left   Post-operative state       Relevant Orders   DG Foot Complete Left (Completed)       -Patient seen and evaluated at bedside -Dressing check performed -Advised patient to make sure to keep dressing clean, dry, and intact to Left foot. Dressing will be changed tomorrow AM during rounding  -Continue with IV antibiotics 48 hours post op -Patient to remain Non-Weightbearing to Left foot; Plan for PT to work with patient on tomorrow  -Continue with rest and elevation to assist with pain and edema control; Order icing as needed for pain for 20 mins behind left knee -Awaiting ABIs; we can follow this up outpatient if patient does not get this test done prior to discharge or if amputation stump site fails to progress to heal -Podiatry will continue to follow closely  -Anticipated discharge plan of care: To home with PO antibiotics in 1-2 days. Patient after discharge to follow up in office within 1 week in office for post op care.   Dr. Landis Martins, DPM  Available via secure chat [place] [phone] office  [phone] cell

## 2020-08-06 NOTE — Progress Notes (Signed)
Penicillin Allergy Note  Assessment: Patient has a listed penicillin allergy of swelling when he was about 10-65 years old. He confirms this is the reaction he had. When asked if he has ever taken amoxicillin or penicillin since this reaction he reports taking it many times and doing very well with no reaction. It is likely he has lost his reaction to penicillin and we will proceed with a direct IV challenge. I have spoke with his nurse who will check on him every 15-30 minutes for the hour after. I will remove his penicillin allergy after confirming he tolerates the Unasyn.   Plan:  -Unasyn 3g IV q6h -If tolerates will remove allergy  Nicoletta Dress, PharmD, [facility] Pharmacist  Phone: [phone]

## 2020-08-07 ENCOUNTER — Other Ambulatory Visit: Payer: Self-pay | Admitting: Sports Medicine

## 2020-08-07 DIAGNOSIS — I96 Gangrene, not elsewhere classified: Secondary | ICD-10-CM | POA: Diagnosis not present

## 2020-08-07 DIAGNOSIS — I5022 Chronic systolic (congestive) heart failure: Secondary | ICD-10-CM | POA: Diagnosis not present

## 2020-08-07 LAB — GLUCOSE, CAPILLARY
Glucose-Capillary: 143 mg/dL — ABNORMAL HIGH (ref 70–99)
Glucose-Capillary: 102 mg/dL — ABNORMAL HIGH (ref 70–99)
Glucose-Capillary: 128 mg/dL — ABNORMAL HIGH (ref 70–99)
Glucose-Capillary: 130 mg/dL — ABNORMAL HIGH (ref 70–99)

## 2020-08-07 MED ORDER — ALUM & MAG HYDROXIDE-SIMETH 200-200-20 MG/5ML PO SUSP
30.0000 mL | Freq: Once | ORAL | Status: AC
  Administered 2020-08-07: 30 mL via ORAL
  Filled 2020-08-07: qty 30

## 2020-08-07 MED ORDER — INSULIN DETEMIR 100 UNIT/ML ~~LOC~~ SOLN
5.0000 [IU] | Freq: Every day | SUBCUTANEOUS | Status: DC
  Administered 2020-08-07 – 2020-08-11 (×5): 5 [IU] via SUBCUTANEOUS
  Filled 2020-08-07 (×5): qty 0.05

## 2020-08-07 MED ORDER — HYDROCODONE-ACETAMINOPHEN 10-325 MG PO TABS: 1.0000 | ORAL_TABLET | Freq: Four times a day (QID) | ORAL | 0 refills | Status: DC | PRN

## 2020-08-07 MED ORDER — CLINDAMYCIN HCL 300 MG PO CAPS: 300.0000 mg | ORAL_CAPSULE | Freq: Three times a day (TID) | ORAL | 0 refills | Status: DC

## 2020-08-07 NOTE — Care Management Important Message (Signed)
Important Message  Patient Details  Name: [name] MRN: [id number] Date of Birth: [birth year]   Medicare Important Message Given:  Yes     Memory Argue 08/07/2020, 5:38 PM

## 2020-08-07 NOTE — Evaluation (Signed)
Physical Therapy Evaluation Patient Details Name: [name] MRN: [id number] DOB: 08-26-1955 Today's Date: 08/07/2020   History of Present Illness  65 yo male with onset of L foot gangrene was admitted, after hx of great toe amp, for transmet amp.  Pt is NWB and MD hoping to send home.  PMHx:  L buttock ulcer, CHF, a-fib, DM, HLD, follicular lymphoma, diverticulosis, gout, sepsis, PNA, Covid 19, tubular adenoma of colon, venous stasis ulcers  Clinical Impression  Pt was seen for transfer to [facility] with permission to move without a cam boot due to lack of fit with ortho tech attempting this.  Pt is unable to slide well, and with walker touches down his heel.  Talked with MD's and note that we are sending him to rehab but not going to allow WB even if difficult to move.  Pt will be a good candidate for standing practice and sliding to chair, but will also need a bari commode with a drop arm.      Follow Up Recommendations SNF    Equipment Recommendations  Rolling walker with 5" wheels    Recommendations for Other Services       Precautions / Restrictions Precautions Precautions: Fall Precaution Comments: venous stasis Required Braces or Orthoses: Other Brace Other Brace: L foot cam boot Restrictions Weight Bearing Restrictions: Yes LLE Weight Bearing: Non weight bearing  Pt does not have a cam boot currently, podiatrist aware, fit issue with ortho tech attempting to get one     Mobility  Bed Mobility Overal bed mobility: Needs Assistance Bed Mobility: Supine to Sit;Sit to Supine     Supine to sit: Min assist Sit to supine: Min assist   General bed mobility comments: pt is able to be assisted onto and into bed once transfer is done, struggles with STS and avoiding wb    Transfers Overall transfer level: Needs assistance Equipment used: Rolling walker (2 wheeled);1 person hand held assist Transfers: Sit to/from [facility] Sit to Stand: Min assist Stand  pivot transfers: Min assist       General transfer comment: pt struggles with standing on first attempt, touched down his heel, and on return could not do a lateral shift because of lack of bariatric BSC with drop arm and finally pivoted with help on RW  Ambulation/Gait             General Gait Details: unable to attempt  Stairs            Wheelchair Mobility    Modified Rankin (Stroke Patients Only)       Balance Overall balance assessment: Needs assistance Sitting-balance support: Single extremity supported;Bilateral upper extremity supported Sitting balance-Leahy Scale: Fair     Standing balance support: Bilateral upper extremity supported;During functional activity Standing balance-Leahy Scale: Poor                               Pertinent Vitals/Pain Pain Assessment: Faces Faces Pain Scale: Hurts a little bit Pain Location: L foot Pain Descriptors / Indicators: Operative site guarding;Guarding Pain Intervention(s): Limited activity within patient's tolerance;Monitored during session;Premedicated before session;Repositioned    Home Living Family/patient expects to be discharged to:: Private residence Living Arrangements: Alone Available Help at Discharge: Friend(s);Available PRN/intermittently Type of Home: House       Home Layout: One level Home Equipment: Cane - single point Additional Comments: awaiting cam boot    Prior Function Level of Independence:  Independent with assistive device(s)         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SPC, working as hairdresser     Journalist, newspaper   Dominant Hand: Right    Extremity/Trunk Assessment   Upper Extremity Assessment Upper Extremity Assessment: Overall WFL for tasks assessed    Lower Extremity Assessment Lower Extremity Assessment: LLE deficits/detail LLE Deficits / Details: LLE NWB and hip/knee are weaker than RLE LLE Coordination: decreased gross motor    Cervical / Trunk Assessment Cervical  / Trunk Assessment: Normal  Communication   Communication: No difficulties  Cognition Arousal/Alertness: Awake/alert Behavior During Therapy: WFL for tasks assessed/performed Overall Cognitive Status: Within Functional Limits for tasks assessed                                 General Comments: Pt is motivated to get better and home      General Comments General comments (skin integrity, edema, etc.): Nursing gave PT permission to try [facility] with pt without cam boot as one has not been brought yet for pt.  Pt is struggling to keep LLE off the floor regardless of equipment and so cannot use BSC safely now    Exercises     Assessment/Plan    PT Assessment Patient needs continued PT services  PT Problem List Decreased strength;Decreased range of motion;Decreased activity tolerance;Decreased balance;Decreased mobility;Decreased skin integrity;Pain;Decreased knowledge of use of DME;Decreased safety awareness       PT Treatment Interventions DME instruction;Gait training;Functional mobility training;Therapeutic activities;Therapeutic exercise;Balance training;Neuromuscular re-education;Patient/family education    PT Goals (Current goals can be found in the Care Plan section)  Acute Rehab PT Goals Patient Stated Goal: to get to rehab and then home PT Goal Formulation: With patient Time For Goal Achievement: 08/21/20 Potential to Achieve Goals: Good    Frequency Min 3X/week   Barriers to discharge Inaccessible home environment;Decreased caregiver support lives alone and cannot stand or transfer safely yet    Co-evaluation               AM-PAC PT "6 Clicks" Mobility  Outcome Measure Help needed turning from your back to your side while in a flat bed without using bedrails?: A Little Help needed moving from lying on your back to sitting on the side of a flat bed without using bedrails?: A Little Help needed moving to and from a bed to a chair (including a  wheelchair)?: A Little Help needed standing up from a chair using your arms (e.g., wheelchair or bedside chair)?: A Little Help needed to walk in hospital room?: Total Help needed climbing 3-5 steps with a railing? : Total 6 Click Score: 14    End of Session Equipment Utilized During Treatment: Gait belt Activity Tolerance: Treatment limited secondary to medical complications (Comment) Patient left: in bed;with call bell/phone within reach;with bed alarm set;with family/visitor present;with nursing/sitter in room Nurse Communication: Mobility status;Weight bearing status;Other (comment) (lack of bariatric BSC and cam Boot) PT Visit Diagnosis: Pain;Difficulty in walking, not elsewhere classified (R26.2);Muscle weakness (generalized) (M62.81);Other abnormalities of gait and mobility (R26.89) Pain - Right/Left: Left Pain - part of body: Ankle and joints of foot    Time: [id number] (+1040-1055) PT Time Calculation (min) (ACUTE ONLY): 50 min   Charges:   PT Evaluation $PT Eval Moderate Complexity: 1 Mod PT Treatments $Therapeutic Activity: 23-37 mins $Neuromuscular Re-education: 8-22 mins       Ramond Dial 08/07/2020, 5:10 PM  Mee Hives,  PT MS Acute Rehab Dept. Number: [place] [facility] [place]

## 2020-08-07 NOTE — Progress Notes (Signed)
[facility] PROGRESS NOTE    Progress Note  [name]  [id number] DOB: [birth year] DOA: 08/03/2020 PCP: Ginger Organ., MD     Brief Narrative:   [name] is an 65 y.o. male past medical history significant for follicular lymphoma morbid obesity, chronic systolic heart failure atrial fibrillation on apixaban, diabetes mellitus type 2 essential hypertension, with a recent amputation of the left great toe last year presents with a left foot infection    Assessment/Plan:   Diabetic foot infection with osteomyelitis of second and third toe: Status post amputation second and third toe and metatarsals on 08/06/2018 Continue continue IV vancomycin and Unasyn and Flagyl. ABI no evidence of significant obstruction on the right, left ABI unreliable Podiatry is recommended to continue IV antibiotics and remains nonweightbearing. Podiatry to consult vascular as an outpatient if needed. To continue antibiotics IV on home oral tomorrow.  Podiatrist to dictate which oral antibiotics. Awaiting physical therapy evaluation.  Chronic systolic heart failure: With an EF to 45% appears to be compensated continue monitor.  Chronic atrial fibrillation: Rate controlled Coreg, resume Eliquis.  Controlled diabetes mellitus type 2: with an A1c of 6.3, tolerating his diet his blood glucose trending up long-acting insulin.  Hyperlipidemia:  Excellent continue Lipitor.  Essential hypertension: Continue Coreg hold Cozaar.  Follicular lymphoma: In remission.  Obesity: BMI 52.   Unstageable left buttock ulcer present on admission: RN Pressure Injury Documentation: Pressure Injury 02/25/17 Unstageable - Full thickness tissue loss in which the base of the ulcer is covered by slough (yellow, tan, gray, green or brown) and/or eschar (tan, brown or black) in the wound bed. Patchy areas of partial thickness skin loss re (Active)  02/25/17 0700  Location: Buttocks  Location  Orientation: Left  Staging: Unstageable - Full thickness tissue loss in which the base of the ulcer is covered by slough (yellow, tan, gray, green or brown) and/or eschar (tan, brown or black) in the wound bed.  Wound Description (Comments): Patchy areas of partial thickness skin loss related to moisture associated skin damage and fungal rash; present on admission to rehab  Present on Admission: Yes      DVT prophylaxis: lovenox Family Communication:none Status is: Inpatient  Remains inpatient appropriate because:Hemodynamically unstable  Dispo: The patient is from: Home              Anticipated d/c is to: Home              Patient currently is not medically stable to d/c.   Difficult to place patient No     Code Status:     Code Status Orders  (From admission, onward)           Start     Ordered   08/04/20 1243  Full code  Continuous        08/04/20 1243           Code Status History     Date Active Date Inactive Code Status Order ID Comments User Context   03/22/2017 1535 04/06/2017 1824 Full Code [id number]  Flora Lipps Inpatient   03/22/2017 1535 03/22/2017 1535 Full Code [id number]  Flora Lipps Inpatient   02/16/2017 2121 03/22/2017 1532 Full Code [id number]  Arnell Asal, NP ED         IV Access:   Peripheral IV   Procedures and diagnostic studies:   DG Foot Complete Left  Result Date: 08/05/2020 CLINICAL DATA:  65 year old male status  post amputation of the distal foot. EXAM: LEFT FOOT - COMPLETE 3+ VIEW COMPARISON:  Left foot radiograph dated 08/03/2020. FINDINGS: Transmetatarsal amputation of the foot. The surgical edges appear sharp. There is no acute fracture or dislocation. The bones are osteopenic. There is extensive degenerative and Charcot changes of the midfoot and tarsometatarsal joints. There is diffuse soft tissue swelling. Subcutaneous calcification of the distal calf may be related to chronic venous stasis or underlying  connective tissue disorder. Skin staples noted over the stump. IMPRESSION: Transmetatarsal amputation of the foot. Electronically Signed   By: Anner Crete M.D.   On: 08/05/2020 19:42   DG Foot Complete Left  Result Date: 08/05/2020 Please see detailed radiograph report in office note.  VAS [place] ABI WITH/WO TBI  Result Date: 08/06/2020  LOWER EXTREMITY DOPPLER STUDY Patient Name:  [name]  Date of Exam:   08/06/2020 Medical Rec #: [id number]     Accession #:    [id number] Date of Birth: 1955-02-03     Patient Gender: M Patient Age:   65Y Exam Location:  [facility] Procedure:      VAS [place] ABI WITH/WO TBI Referring Phys: [id number] JENNIFER YATES --------------------------------------------------------------------------------  Indications: Ulceration, and left transmetatarsal amputation. High Risk         Hypertension, hyperlipidemia, Diabetes, past history of Factors:          smoking. Other Factors: Afib, CHF, venous insufficiency,.  Comparison Study: Previous exam 10/01/18 Performing Technologist: Rogelia Rohrer RVT, RDMS  Examination Guidelines: A complete evaluation includes at minimum, Doppler waveform signals and systolic blood pressure reading at the level of bilateral brachial, anterior tibial, and posterior tibial arteries, when vessel segments are accessible. Bilateral testing is considered an integral part of a complete examination. Photoelectric Plethysmograph (PPG) waveforms and toe systolic pressure readings are included as required and additional duplex testing as needed. Limited examinations for reoccurring indications may be performed as noted.  ABI Findings: +---------+------------------+-----+---------+--------+ Right    Rt Pressure (mmHg)IndexWaveform Comment  +---------+------------------+-----+---------+--------+ Brachial 136                    triphasic         +---------+------------------+-----+---------+--------+ PTA      172               1.26 biphasic           +---------+------------------+-----+---------+--------+ DP       157               1.15 biphasic          +---------+------------------+-----+---------+--------+ Great Toe89                0.65 Abnormal Mild     +---------+------------------+-----+---------+--------+ +---------+------------------+-----+----------+-------+ Left     Lt Pressure (mmHg)IndexWaveform  Comment +---------+------------------+-----+----------+-------+ Brachial                        triphasic IV      +---------+------------------+-----+----------+-------+ PTA      139               1.02 monophasic        +---------+------------------+-----+----------+-------+ DP       121               0.89 monophasic        +---------+------------------+-----+----------+-------+ Great Toe  TMA     +---------+------------------+-----+----------+-------+ +-------+-----------+-----------+------------+------------+ ABI/TBIToday's ABIToday's TBIPrevious ABIPrevious TBI +-------+-----------+-----------+------------+------------+ Right  1.26       0.65       1.04        0.62         +-------+-----------+-----------+------------+------------+ Left   1.15       TMA        1.06        0.76         +-------+-----------+-----------+------------+------------+  Summary: Right: Resting right ankle-brachial index is within normal range. No evidence of significant right lower extremity arterial disease. The right toe-brachial index is abnormal, indicating small vessel disease. Left: ABIs are unreliable. ABI value indicates normal resting flow however, monophasic waveforms in presence of diabetic patient indicative of vessel calcification which may underestimate level of disease. Post TMA - unable to obtain toe pressures.  *See table(s) above for measurements and observations.     Preliminary      Medical Consultants:   None.   Subjective:    Mieczyslaw D Horan pain control had a  bowel movement.  Objective:    Vitals:   08/06/20 1136 08/06/20 2012 08/07/20 0053 08/07/20 0525  BP: 110/66 (!) 102/58 (!) 97/54 (!) 108/55  Pulse: 62 (!) 42 (!) 53 (!) 46  Resp: '18 17 17 17  '$ Temp: 98 F (36.7 C) 98.8 F (37.1 C) 98.4 F (36.9 C) 98.5 F (36.9 C)  TempSrc: Oral Oral Oral Oral  SpO2: 98% 95% 93% 94%  Weight:      Height:       SpO2: 94 % O2 Flow Rate (L/min): 2 L/min   Intake/Output Summary (Last 24 hours) at 08/07/2020 0744 Last data filed at 08/07/2020 0600 Gross per 24 hour  Intake 1850.76 ml  Output 1025 ml  Net 825.76 ml    Filed Weights   08/03/20 2047 08/05/20 1449  Weight: (!) 170 kg (!) 155.6 kg    Exam: General exam: In no acute distress. Respiratory system: Good air movement and clear to auscultation. Cardiovascular system: S1 & S2 heard, RRR. No JVD. Gastrointestinal system: Abdomen is nondistended, soft and nontender.  Extremities: Left stump has dressing Skin: No rashes, lesions or ulcers  Data Reviewed:    Labs: Basic Metabolic Panel: Recent Labs  Lab 08/03/20 2044 08/05/20 0119  NA 134* 135  K 3.8 4.0  CL 101 107  CO2 23 21*  GLUCOSE 109* 134*  BUN 26* 18  CREATININE 1.08 0.83  CALCIUM 9.1 8.6*    GFR Estimated Creatinine Clearance: 134.8 mL/min (by C-G formula based on SCr of 0.83 mg/dL). Liver Function Tests: Recent Labs  Lab 08/03/20 2044  AST 22  ALT 21  ALKPHOS 134*  BILITOT 1.4*  PROT 7.6  ALBUMIN 3.2*    No results for input(s): LIPASE, AMYLASE in the last 168 hours. No results for input(s): AMMONIA in the last 168 hours. Coagulation profile No results for input(s): INR, PROTIME in the last 168 hours. COVID-19 Labs  Recent Labs    08/04/20 1239  CRP 10.0*     Lab Results  Component Value Date   SARSCOV2NAA NEGATIVE 08/05/2020   SARSCOV2NAA NEGATIVE 07/22/2020   SARSCOV2NAA NEGATIVE 07/06/2020   [place] NEGATIVE 05/03/2019    CBC: Recent Labs  Lab 08/03/20 2044 08/05/20 0119   WBC 10.6* 7.5  NEUTROABS 8.2*  --   HGB 13.6 12.7*  HCT 42.0 40.1  MCV 97.0 97.8  PLT 270 243    Cardiac Enzymes: No  results for input(s): CKTOTAL, CKMB, CKMBINDEX, TROPONINI in the last 168 hours. BNP (last 3 results) No results for input(s): PROBNP in the last 8760 hours. CBG: Recent Labs  Lab 08/05/20 2059 08/06/20 0743 08/06/20 1135 08/06/20 1649 08/06/20 2102  GLUCAP 129* 88 105* 91 141*    D-Dimer: No results for input(s): DDIMER in the last 72 hours. Hgb A1c: Recent Labs    08/04/20 1239  HGBA1C 6.4*    Lipid Profile: No results for input(s): CHOL, HDL, LDLCALC, TRIG, CHOLHDL, LDLDIRECT in the last 72 hours. Thyroid function studies: No results for input(s): TSH, T4TOTAL, T3FREE, THYROIDAB in the last 72 hours.  Invalid input(s): FREET3 Anemia work up: No results for input(s): VITAMINB12, FOLATE, FERRITIN, TIBC, IRON, RETICCTPCT in the last 72 hours. Sepsis Labs: Recent Labs  Lab 08/03/20 2044 08/05/20 0119  WBC 10.6* 7.5  LATICACIDVEN 1.0  --     Microbiology Recent Results (from the past 240 hour(s))  Blood culture (routine x 2)     Status: None (Preliminary result)   Collection Time: 08/03/20  8:45 PM   Specimen: BLOOD  Result Value Ref Range Status   Specimen Description BLOOD LEFT ARM  Final   Special Requests   Final    BOTTLES DRAWN AEROBIC AND ANAEROBIC Blood Culture adequate volume   Culture   Final    NO GROWTH 3 DAYS Performed at [facility], [address], [place], [place] [address]    Report Status PENDING  Incomplete  Blood culture (routine x 2)     Status: None (Preliminary result)   Collection Time: 08/03/20  8:45 PM   Specimen: BLOOD  Result Value Ref Range Status   Specimen Description BLOOD LEFT ARM  Final   Special Requests   Final    BOTTLES DRAWN AEROBIC ONLY Blood Culture results may not be optimal due to an excessive volume of blood received in culture bottles   Culture   Final    NO GROWTH 3  DAYS Performed at [facility], [place] [address] [place], [place] [address]    Report Status PENDING  Incomplete  Surgical pcr screen     Status: Abnormal   Collection Time: 08/05/20 12:23 AM   Specimen: Nasopharyngeal Swab; Nasal Swab  Result Value Ref Range Status   MRSA, PCR NEGATIVE NEGATIVE Final   Staphylococcus aureus POSITIVE (A) NEGATIVE Final    Comment: (NOTE) The Xpert SA Assay (FDA approved for NASAL specimens in patients 65 years of age and older), is one component of a comprehensive surveillance program. It is not intended to diagnose infection nor to guide or monitor treatment. Performed at [facility], [place] [address], [place], [place] [address]   SARS CORONAVIRUS 2 (TAT 6-24 HRS) Nasopharyngeal Nasopharyngeal Swab     Status: None   Collection Time: 08/05/20 12:51 AM   Specimen: Nasopharyngeal Swab  Result Value Ref Range Status   SARS Coronavirus 2 NEGATIVE NEGATIVE Final    Comment: (NOTE) SARS-CoV-2 target nucleic acids are NOT DETECTED.  The SARS-CoV-2 RNA is generally detectable in upper and lower respiratory specimens during the acute phase of infection. Negative results do not preclude SARS-CoV-2 infection, do not rule out co-infections with other pathogens, and should not be used as the sole basis for treatment or other patient management decisions. Negative results must be combined with clinical observations, patient history, and epidemiological information. The expected result is Negative.  Fact Sheet for Patients: SugarRoll.be  Fact Sheet for Healthcare  Providers: https://www.woods-mathews.com/  This test is not yet approved or cleared by the [place] and  has been authorized for detection and/or diagnosis of SARS-CoV-2 by FDA under an Emergency Use Authorization (EUA). This EUA will remain  in effect (meaning this test can be used) for the duration of the COVID-19 declaration under  Se ction 564(b)(1) of the Act, 21 U.S.C. section 360bbb-3(b)(1), unless the authorization is terminated or revoked sooner.  Performed at [facility], [place] [address] [place], [place] [address]      Medications:    allopurinol  300 mg Oral Daily   apixaban  5 mg Oral BID   atorvastatin  20 mg Oral Daily   carvedilol  12.5 mg Oral BID   Chlorhexidine Gluconate Cloth  6 each Topical Daily   docusate sodium  100 mg Oral BID   insulin aspart  0-15 Units Subcutaneous TID WC   insulin aspart  0-5 Units Subcutaneous QHS   losartan  50 mg Oral BID   metroNIDAZOLE  500 mg Oral Q8H   multivitamin with minerals  1 tablet Oral Daily   mupirocin ointment  1 application Nasal BID   nutrition supplement (JUVEN)  1 packet Oral BID BM   pantoprazole  40 mg Oral Daily   polyethylene glycol  17 g Oral BID   saccharomyces boulardii  500 mg Oral Daily   sertraline  50 mg Oral Daily   Continuous Infusions:  ampicillin-sulbactam (UNASYN) IV 3 g (08/07/20 0310)   vancomycin 1,500 mg (08/06/20 2115)      LOS: 3 days   Charlynne Cousins  [facility]  08/07/2020, 7:44 AM

## 2020-08-07 NOTE — Progress Notes (Signed)
Subjective: [name] is a 65 y.o. male patient seen at bedside, resting comfortably in no acute distress s/p day #2 Left Transmetatarsal amputation. Patient admits pain at surgical site throbbing in nature but slowly getting better, denies calf pain, denies headache, chest pain, shortness of breath, nausea, vomitting, denies loss of appetite, denies problems with voiding. No other issues noted.   Patient Active Problem List   Diagnosis Date Noted   Gangrene ([place]) 08/04/2020   Permanent atrial fibrillation ([place]) [id number]   Chronic systolic HF (heart failure) ([place]) 07/21/2019   Cough 07/12/2019   Educated about COVID-19 virus infection 05/12/2019   Non-pressure chronic ulcer of other part of left foot with unspecified severity ([place]) 04/11/2019   Pain in limb 04/11/2019   Adverse effect of other vaccines and biological substances, initial encounter 04/10/2019   COVID-19 virus infection 08/07/2018   Recurrent pneumonia 02/26/2018   Port-A-Cath in place 01/08/2018   History of colonic polyps 12/11/2017   Thrombophilia ([place]) 10/30/2017   Heart failure ([place]) 07/04/2017   Dyspnea on exertion 06/19/2017   Chronic anticoagulation 05/22/2017   Cardiomyopathy ([place]) 05/22/2017   Hypotension 05/22/2017   RBBB 05/22/2017   UTI due to extended-spectrum beta lactamase (ESBL) producing Escherichia coli 04/06/2017   Debility    Wound infection    OSA (obstructive sleep apnea)    Acute systolic heart failure ([place])    Morbid obesity ([place])    Diabetes mellitus, new onset ([place])    Dysuria    Acute respiratory distress syndrome (ARDS) ([place])    Influenza A with pneumonia    Anxiety    Current moderate episode of major depressive disorder without prior episode ([place])    Obesity, Class III, BMI 40-49.9 (morbid obesity) ([place])    Community acquired pneumonia    Critical illness myopathy    Dysphagia    Non-Hodgkin's lymphoma ([place])    Benign essential HTN    Leukocytosis    Hypokalemia     Tracheostomy status ([place])    Hypoxic    Pressure injury of skin 02/28/2017   Status post tracheostomy ([place])    Follicular lymphoma grade II of intrathoracic lymph nodes ([place]) 05/02/2016   Portacath in place 05/04/2015   Localized, primary osteoarthritis of hand 09/08/2014   Atherosclerotic heart disease of native coronary artery without angina pectoris 08/26/2013   Tobacco user 07/30/2012   Gout attack 01/16/2012   Psoriasis [id number]   Follicular lymphoma grade II ([place]) 12/16/2010   Personal history of non-Hodgkin lymphomas 09/20/2010   Peripheral venous insufficiency 11/16/2009   Encounter for general adult medical examination without abnormal findings 06/16/2009   Hyperlipidemia 06/16/2009   Impaired fasting glucose 06/16/2009   Proteinuria 06/16/2009   Varicose veins of other specified sites 06/16/2009     Current Facility-Administered Medications:    acetaminophen (TYLENOL) tablet 650 mg, 650 mg, Oral, Q6H PRN **OR** acetaminophen (TYLENOL) suppository 650 mg, 650 mg, Rectal, Q6H PRN, Karmen Bongo, MD   allopurinol (ZYLOPRIM) tablet 300 mg, 300 mg, Oral, Daily, Karmen Bongo, MD, 300 mg at 08/06/20 0928   Ampicillin-Sulbactam (UNASYN) 3 g in sodium chloride 0.9 % 100 mL IVPB, 3 g, Intravenous, Q6H, Charlynne Cousins, MD, Last Rate: 200 mL/hr at 08/07/20 0310, 3 g at 08/07/20 0310   apixaban (ELIQUIS) tablet 5 mg, 5 mg, Oral, BID, Charlynne Cousins, MD, 5 mg at 08/06/20 2013   atorvastatin (LIPITOR) tablet 20 mg, 20 mg, Oral, Daily, Karmen Bongo, MD, 20 mg at 08/06/20 0930   bisacodyl (  DULCOLAX) EC tablet 5 mg, 5 mg, Oral, Daily PRN, Karmen Bongo, MD   carvedilol (COREG) tablet 12.5 mg, 12.5 mg, Oral, BID, Karmen Bongo, MD, 12.5 mg at 08/06/20 2013   Chlorhexidine Gluconate Cloth 2 % PADS 6 each, 6 each, Topical, Daily, Karmen Bongo, MD, 6 each at 08/06/20 0931   docusate sodium (COLACE) capsule 100 mg, 100 mg, Oral, BID, Karmen Bongo, MD, 100 mg at  08/06/20 2014   hydrALAZINE (APRESOLINE) injection 5 mg, 5 mg, Intravenous, Q4H PRN, Karmen Bongo, MD   insulin aspart (novoLOG) injection 0-15 Units, 0-15 Units, Subcutaneous, TID WC, Karmen Bongo, MD   insulin aspart (novoLOG) injection 0-5 Units, 0-5 Units, Subcutaneous, QHS, Karmen Bongo, MD   losartan (COZAAR) tablet 50 mg, 50 mg, Oral, BID, Charlynne Cousins, MD, 50 mg at 08/06/20 2013   metroNIDAZOLE (FLAGYL) tablet 500 mg, 500 mg, Oral, Q8H, Karmen Bongo, MD, 500 mg at 08/06/20 2115   multivitamin with minerals tablet 1 tablet, 1 tablet, Oral, Daily, Charlynne Cousins, MD, 1 tablet at 08/06/20 1436   mupirocin ointment (BACTROBAN) 2 % 1 application, 1 application, Nasal, BID, Karmen Bongo, MD, 1 application at [id number]   nutrition supplement (JUVEN) (JUVEN) powder packet 1 packet, 1 packet, Oral, BID BM, Charlynne Cousins, MD, 1 packet at 08/06/20 1433   ondansetron (ZOFRAN) tablet 4 mg, 4 mg, Oral, Q6H PRN **OR** ondansetron (ZOFRAN) injection 4 mg, 4 mg, Intravenous, Q6H PRN, Karmen Bongo, MD   oxyCODONE-acetaminophen (PERCOCET/ROXICET) 5-325 MG per tablet 1-2 tablet, 1-2 tablet, Oral, Q4H PRN, Charlynne Cousins, MD, 2 tablet at 08/07/20 0306   pantoprazole (PROTONIX) EC tablet 40 mg, 40 mg, Oral, Daily, Karmen Bongo, MD, 40 mg at 08/06/20 [id number]   polyethylene glycol (MIRALAX / GLYCOLAX) packet 17 g, 17 g, Oral, Daily PRN, Karmen Bongo, MD   polyethylene glycol (MIRALAX / GLYCOLAX) packet 17 g, 17 g, Oral, BID, Charlynne Cousins, MD, 17 g at 08/06/20 [id number]   saccharomyces boulardii (FLORASTOR) capsule 500 mg, 500 mg, Oral, Daily, Karmen Bongo, MD, 500 mg at 08/06/20 [id number]   sertraline (ZOLOFT) tablet 50 mg, 50 mg, Oral, Daily, Karmen Bongo, MD, 50 mg at 08/06/20 0931   vancomycin (VANCOREADY) IVPB 1500 mg/300 mL, 1,500 mg, Intravenous, Q12H, Karmen Bongo, MD, Last Rate: 150 mL/hr at 08/06/20 2115, 1,500 mg at 08/06/20  2115  Facility-Administered Medications Ordered in Other Encounters:    sodium chloride 0.9 % injection 10 mL, 10 mL, Intravenous, PRN, Curt Bears, MD, 10 mL at 05/04/15 1529  Allergies  Allergen Reactions   Latex Hives and Swelling     Objective: Today's Vitals   08/07/20 0016 08/07/20 0053 08/07/20 0339 08/07/20 0525  BP:  (!) 97/54  (!) 108/55  Pulse:  (!) 53  (!) 46  Resp:  17  17  Temp:  98.4 F (36.9 C)  98.5 F (36.9 C)  TempSrc:  Oral  Oral  SpO2:  93%  94%  Weight:      Height:      PainSc: Asleep  Asleep     General: No acute distress  Left Lower extremity: Dressing to left foot clean, dry, intact. No strikethrough noted, upon removal of the dressing sutures and staples intact to amputation stump site capillary fill time present to dorsal and plantar flaps there is very minimal erythema very minimal edema no warmth very minimal active bloody drainage amputation stump site appears to be healthy and viable.  Mild pain to palpation to foot.  No calf pain. Range of motion excluding surgical site within normal limits.    Assessment and Plan:  Problem List Items Addressed This Visit       Other   * (Principal) Gangrene ([place])   Other Visit Diagnoses     Gangrene of foot ([place])    -  Primary   Left   Post-operative state       Relevant Orders   DG Foot Complete Left (Completed)       -Patient seen and evaluated at bedside -Dressing change performed applied Xeroform and dry dressing to left foot -Advised patient to make sure to keep dressing clean, dry, and intact to Left foot.  Advised patient dressing will be changed weekly at his follow-up visit in office  -Continue with IV antibiotics and at discharge plan to transition patient to clindamycin for 7 days -Patient to remain Non-Weightbearing to Left foot; Plan for PT to work with patient today and to dispense cam boot for additional protection -Continue with rest and elevation to assist with pain and  edema control -ABIs reviewed discussed with patient that since this is post TMA ABIs can be unreliable if there are any concerns of vascular compromise to his stump or slow healing we will consult vascular outpatient if needed -Podiatry will continue to follow closely  -Anticipated discharge plan of care: To home tomorrow with PO antibiotics and pain medication with instructions to remain nonweightbearing to left lower extremity and to keep dressing clean dry intact until office follow-up in 1 week.  Office will contact patient to schedule.  Case discussed with Dr. Jacqualyn Posey.  Dr. Landis Martins, DPM  Available via secure chat [place] [phone] office  [phone] cell

## 2020-08-07 NOTE — Progress Notes (Signed)
PT Cancellation Note  Patient Details Name: [name] MRN: [id number] DOB: August 07, 1955   Cancelled Treatment:    Reason Eval/Treat Not Completed: Other (comment).  Pt is without his cam boot to get OOB as ordered, retry as time and pt allow.   Ramond Dial 08/07/2020, 10:56 AM  Mee Hives, PT MS Acute Rehab Dept. Number: [place] [facility] [place]

## 2020-08-07 NOTE — Progress Notes (Signed)
Orthopedic Tech Progress Note Patient Details:  [name] 02/10/1955 [id number]  Ortho Devices Type of Ortho Device: Darco shoe Ortho Device/Splint Location: Left Foot Ortho Device/Splint Interventions: Application   Post Interventions Patient Tolerated: Well Instructions Provided: Adjustment of device  [name] [name] 08/07/2020, 6:16 PM

## 2020-08-07 NOTE — Progress Notes (Signed)
Postoperative antibiotic and pain medication sent to pharmacy for patient to pick up discharge

## 2020-08-07 NOTE — Plan of Care (Signed)

## 2020-08-08 DIAGNOSIS — I96 Gangrene, not elsewhere classified: Secondary | ICD-10-CM | POA: Diagnosis not present

## 2020-08-08 DIAGNOSIS — I5022 Chronic systolic (congestive) heart failure: Secondary | ICD-10-CM | POA: Diagnosis not present

## 2020-08-08 LAB — CULTURE, BLOOD (ROUTINE X 2)
Culture: NO GROWTH
Culture: NO GROWTH
Special Requests: ADEQUATE

## 2020-08-08 LAB — GLUCOSE, CAPILLARY
Glucose-Capillary: 108 mg/dL — ABNORMAL HIGH (ref 70–99)
Glucose-Capillary: 136 mg/dL — ABNORMAL HIGH (ref 70–99)
Glucose-Capillary: 133 mg/dL — ABNORMAL HIGH (ref 70–99)
Glucose-Capillary: 93 mg/dL (ref 70–99)

## 2020-08-08 MED ORDER — TORSEMIDE 20 MG PO TABS
40.0000 mg | ORAL_TABLET | Freq: Every day | ORAL | Status: DC
  Administered 2020-08-08: 40 mg via ORAL
  Filled 2020-08-08 (×2): qty 2

## 2020-08-08 MED ORDER — POTASSIUM CHLORIDE CRYS ER 20 MEQ PO TBCR
20.0000 meq | EXTENDED_RELEASE_TABLET | Freq: Every day | ORAL | Status: DC
  Administered 2020-08-08 – 2020-08-11 (×4): 20 meq via ORAL
  Filled 2020-08-08 (×4): qty 1

## 2020-08-08 MED ORDER — LOSARTAN POTASSIUM 50 MG PO TABS: 50.0000 mg | ORAL_TABLET | Freq: Two times a day (BID) | ORAL | Status: DC

## 2020-08-08 NOTE — Progress Notes (Signed)
Pharmacy Antibiotic Note  [name] is a 65 y.o. male admitted on 08/03/2020 with  osteo .  Pharmacy has been consulted for Vanco dosing.  ID: Osteo of 2nd and 3rd toes.  Afebrile. WBC WNL, Scr <1 S/p left transmetatarsal amputation  Vanco 7/26 >> Aztreonam 7/26 >>7/28 Flagyl 7/26 >>7/30 Unasyn 7/28 >>  7/27: MRSA PCR: neg but SA positive 7/27: COVID neg 7/25: BC x 2>>  Plan: Continue Vanco to '1750mg'$  IV q12hrs (AUC 497, Scr 0.83) Peak/trough as indicated  Unasyn 3g IV q6h   Height: '5\' 11"'$  (180.3 cm) Weight: (!) 155.6 kg (343 lb) IBW/kg (Calculated) : 75.3  Temp (24hrs), Avg:98.2 F (36.8 C), Min:97.8 F (36.6 C), Max:98.7 F (37.1 C)  Recent Labs  Lab 08/03/20 2044 08/05/20 0119  WBC 10.6* 7.5  CREATININE 1.08 0.83  LATICACIDVEN 1.0  --      Estimated Creatinine Clearance: 134.8 mL/min (by C-G formula based on SCr of 0.83 mg/dL).    Allergies  Allergen Reactions   Latex Hives and Swelling    Lestine Box, PharmD PGY2 Infectious Diseases Pharmacy Resident   Please check [email or web address] for unit-specific pharmacy phone numbers

## 2020-08-08 NOTE — Progress Notes (Signed)
Inpatient Rehab Admissions Coordinator:   CIR consult received. PT is recommending SNF. I reached out to [facility] to see if  bariatric status will be a barrier to getting Pt. Into SNF. If that is the case, we can consider him for CIR.  Clemens Catholic, [place], [place] Admissions Coordinator  [phone] ([place]) [phone] (office)

## 2020-08-08 NOTE — Progress Notes (Signed)
[facility] PROGRESS NOTE    Progress Note  [name]  [id number] DOB: [birth year] DOA: 08/03/2020 PCP: Ginger Organ., MD     Brief Narrative:   [name] is an 65 y.o. male past medical history significant for follicular lymphoma morbid obesity, chronic systolic heart failure atrial fibrillation on apixaban, diabetes mellitus type 2 essential hypertension, with a recent amputation of the left great toe last year presents with a left foot infection  Patient is stable awaiting skilled nursing facility placement.  Assessment/Plan:   Diabetic foot infection with osteomyelitis of second and third toe: Status post amputation second and third toe and metatarsals on 08/06/2018 ABI no evidence of significant obstruction on the right, left ABI unreliable Podiatry is recommended to continue IV antibiotics and remains nonweightbearing. Podiatry to consult vascular as an outpatient if needed. To continue antibiotics IV on home oral tomorrow.  Podiatrist to dictate which oral antibiotics. Physical therapy evaluated the patient recommended skilled nursing facility, the patient is nonweightbearing. Patient would like inpatient rehab to evaluate his case to see if he qualifies.  Chronic systolic heart failure: With an EF to 45% appears to be compensated continue monitor. Resume ACE inhibitor and diuretic therapy.  Recheck a basic metabolic panel in the morning.  Chronic atrial fibrillation: Rate controlled Coreg, resume Eliquis.  Controlled diabetes mellitus type 2: with an A1c of 6.3, tolerating his diet his blood glucose trending up long-acting insulin.  Hyperlipidemia:  Excellent continue Lipitor.  Essential hypertension: Continue Coreg, Cozaar and torsemide.  Recent potassium supplementation.  Follicular lymphoma: In remission.  Obesity: BMI 52.   Unstageable left buttock ulcer present on admission: RN Pressure Injury Documentation: Pressure Injury 02/25/17  Unstageable - Full thickness tissue loss in which the base of the ulcer is covered by slough (yellow, tan, gray, green or brown) and/or eschar (tan, brown or black) in the wound bed. Patchy areas of partial thickness skin loss re (Active)  02/25/17 0700  Location: Buttocks  Location Orientation: Left  Staging: Unstageable - Full thickness tissue loss in which the base of the ulcer is covered by slough (yellow, tan, gray, green or brown) and/or eschar (tan, brown or black) in the wound bed.  Wound Description (Comments): Patchy areas of partial thickness skin loss related to moisture associated skin damage and fungal rash; present on admission to rehab  Present on Admission: Yes      DVT prophylaxis: lovenox Family Communication:none Status is: Inpatient  Remains inpatient appropriate because:Hemodynamically unstable  Dispo: The patient is from: Home              Anticipated d/c is to: Home              Patient currently is not medically stable to d/c.   Difficult to place patient No     Code Status:     Code Status Orders  (From admission, onward)           Start     Ordered   08/04/20 1243  Full code  Continuous        08/04/20 1243           Code Status History     Date Active Date Inactive Code Status Order ID Comments User Context   03/22/2017 1535 04/06/2017 1824 Full Code [id number]  Flora Lipps Inpatient   03/22/2017 1535 03/22/2017 1535 Full Code [id number]  Flora Lipps Inpatient   02/16/2017 2121 03/22/2017 1532 Full Code [id number]  Arnell Asal, NP ED         IV Access:   Peripheral IV   Procedures and diagnostic studies:   VAS [place] ABI WITH/WO TBI  Result Date: 08/07/2020  LOWER EXTREMITY DOPPLER STUDY Patient Name:  [name]  Date of Exam:   08/06/2020 Medical Rec #: [id number]     Accession #:    [id number] Date of Birth: [birth year]     Patient Gender: M Patient Age:   65Y Exam Location:  [facility] Procedure:       VAS [place] ABI WITH/WO TBI Referring Phys: [id number] JENNIFER YATES --------------------------------------------------------------------------------  Indications: Ulceration, and left transmetatarsal amputation. High Risk         Hypertension, hyperlipidemia, Diabetes, past history of Factors:          smoking. Other Factors: Afib, CHF, venous insufficiency,.  Comparison Study: Previous exam 10/01/18 Performing Technologist: Rogelia Rohrer RVT, RDMS  Examination Guidelines: A complete evaluation includes at minimum, Doppler waveform signals and systolic blood pressure reading at the level of bilateral brachial, anterior tibial, and posterior tibial arteries, when vessel segments are accessible. Bilateral testing is considered an integral part of a complete examination. Photoelectric Plethysmograph (PPG) waveforms and toe systolic pressure readings are included as required and additional duplex testing as needed. Limited examinations for reoccurring indications may be performed as noted.  ABI Findings: +---------+------------------+-----+---------+--------+ Right    Rt Pressure (mmHg)IndexWaveform Comment  +---------+------------------+-----+---------+--------+ Brachial 136                    triphasic         +---------+------------------+-----+---------+--------+ PTA      172               1.26 biphasic          +---------+------------------+-----+---------+--------+ DP       157               1.15 biphasic          +---------+------------------+-----+---------+--------+ Great Toe89                0.65 Abnormal Mild     +---------+------------------+-----+---------+--------+ +---------+------------------+-----+----------+-------+ Left     Lt Pressure (mmHg)IndexWaveform  Comment +---------+------------------+-----+----------+-------+ Brachial                        triphasic IV      +---------+------------------+-----+----------+-------+ PTA      139               1.02 monophasic         +---------+------------------+-----+----------+-------+ DP       121               0.89 monophasic        +---------+------------------+-----+----------+-------+ Great Toe                                 TMA     +---------+------------------+-----+----------+-------+ +-------+-----------+-----------+------------+------------+ ABI/TBIToday's ABIToday's TBIPrevious ABIPrevious TBI +-------+-----------+-----------+------------+------------+ Right  1.26       0.65       1.04        0.62         +-------+-----------+-----------+------------+------------+ Left   1.15       TMA        1.06        0.76         +-------+-----------+-----------+------------+------------+  Summary: Right: Resting  right ankle-brachial index is within normal range. No evidence of significant right lower extremity arterial disease. The right toe-brachial index is abnormal, indicating small vessel disease. Left: ABIs are unreliable. ABI value indicates normal resting flow however, monophasic waveforms in presence of diabetic patient indicative of vessel calcification which may underestimate level of disease. Post TMA - unable to obtain toe pressures.  *See table(s) above for measurements and observations.  Electronically signed by Deitra Mayo MD on 08/07/2020 at 56:54:56 AM.    Final      Medical Consultants:   None.   Subjective:    Martavion D Doerr pain is controlled had a bowel movement.  Objective:    Vitals:   08/07/20 1639 08/07/20 2111 08/08/20 0601 08/08/20 0737  BP: (!) 97/43 (!) 113/56 (!) 132/53 (!) 123/51  Pulse: 63 (!) 54 (!) 54 60  Resp:  '18 18 16  '$ Temp: 98.7 F (37.1 C) 98 F (36.7 C) 97.9 F (36.6 C) 98.5 F (36.9 C)  TempSrc: Oral Oral Oral Oral  SpO2: 96% 97% 96% 93%  Weight:      Height:       SpO2: 93 % O2 Flow Rate (L/min): 2 L/min   Intake/Output Summary (Last 24 hours) at 08/08/2020 0959 Last data filed at 08/08/2020 0602 Gross per 24 hour  Intake 580  ml  Output 1525 ml  Net -945 ml    Filed Weights   08/03/20 2047 08/05/20 1449  Weight: (!) 170 kg (!) 155.6 kg    Exam: General exam: In no acute distress. Respiratory system: Good air movement and clear to auscultation. Cardiovascular system: S1 & S2 heard, RRR. No JVD. Gastrointestinal system: Abdomen is nondistended, soft and nontender.  Extremities: No pedal edema. Skin: No rashes, lesions or ulcers  Data Reviewed:    Labs: Basic Metabolic Panel: Recent Labs  Lab 08/03/20 2044 08/05/20 0119  NA 134* 135  K 3.8 4.0  CL 101 107  CO2 23 21*  GLUCOSE 109* 134*  BUN 26* 18  CREATININE 1.08 0.83  CALCIUM 9.1 8.6*    GFR Estimated Creatinine Clearance: 134.8 mL/min (by C-G formula based on SCr of 0.83 mg/dL). Liver Function Tests: Recent Labs  Lab 08/03/20 2044  AST 22  ALT 21  ALKPHOS 134*  BILITOT 1.4*  PROT 7.6  ALBUMIN 3.2*    No results for input(s): LIPASE, AMYLASE in the last 168 hours. No results for input(s): AMMONIA in the last 168 hours. Coagulation profile No results for input(s): INR, PROTIME in the last 168 hours. COVID-19 Labs  No results for input(s): DDIMER, FERRITIN, LDH, CRP in the last 72 hours.   Lab Results  Component Value Date   SARSCOV2NAA NEGATIVE 08/05/2020   SARSCOV2NAA NEGATIVE 07/22/2020   [place] NEGATIVE 07/06/2020   [place] NEGATIVE 05/03/2019    CBC: Recent Labs  Lab 08/03/20 2044 08/05/20 0119  WBC 10.6* 7.5  NEUTROABS 8.2*  --   HGB 13.6 12.7*  HCT 42.0 40.1  MCV 97.0 97.8  PLT 270 243    Cardiac Enzymes: No results for input(s): CKTOTAL, CKMB, CKMBINDEX, TROPONINI in the last 168 hours. BNP (last 3 results) No results for input(s): PROBNP in the last 8760 hours. CBG: Recent Labs  Lab 08/07/20 0815 08/07/20 1229 08/07/20 1638 08/07/20 2102 08/08/20 0817  GLUCAP 143* 102* 128* 130* 108*    D-Dimer: No results for input(s): DDIMER in the last 72 hours. Hgb A1c: No results for  input(s): HGBA1C in the last 72 hours.  Lipid Profile: No results for input(s): CHOL, HDL, LDLCALC, TRIG, CHOLHDL, LDLDIRECT in the last 72 hours. Thyroid function studies: No results for input(s): TSH, T4TOTAL, T3FREE, THYROIDAB in the last 72 hours.  Invalid input(s): FREET3 Anemia work up: No results for input(s): VITAMINB12, FOLATE, FERRITIN, TIBC, IRON, RETICCTPCT in the last 72 hours. Sepsis Labs: Recent Labs  Lab 08/03/20 2044 08/05/20 0119  WBC 10.6* 7.5  LATICACIDVEN 1.0  --     Microbiology Recent Results (from the past 240 hour(s))  Blood culture (routine x 2)     Status: None (Preliminary result)   Collection Time: 08/03/20  8:45 PM   Specimen: BLOOD  Result Value Ref Range Status   Specimen Description BLOOD LEFT ARM  Final   Special Requests   Final    BOTTLES DRAWN AEROBIC AND ANAEROBIC Blood Culture adequate volume   Culture   Final    NO GROWTH 3 DAYS Performed at [facility], [address], [place], [place] [address]    Report Status PENDING  Incomplete  Blood culture (routine x 2)     Status: None (Preliminary result)   Collection Time: 08/03/20  8:45 PM   Specimen: BLOOD  Result Value Ref Range Status   Specimen Description BLOOD LEFT ARM  Final   Special Requests   Final    BOTTLES DRAWN AEROBIC ONLY Blood Culture results may not be optimal due to an excessive volume of blood received in culture bottles   Culture   Final    NO GROWTH 3 DAYS Performed at [facility], [place] [address], [place], [place] [address]    Report Status PENDING  Incomplete  Surgical pcr screen     Status: Abnormal   Collection Time: 08/05/20 12:23 AM   Specimen: Nasopharyngeal Swab; Nasal Swab  Result Value Ref Range Status   MRSA, PCR NEGATIVE NEGATIVE Final   Staphylococcus aureus POSITIVE (A) NEGATIVE Final    Comment: (NOTE) The Xpert SA Assay (FDA approved for NASAL specimens in patients 19 years of age and older), is one component of a  comprehensive surveillance program. It is not intended to diagnose infection nor to guide or monitor treatment. Performed at [facility], [place] [address], [place], [place] [address]   SARS CORONAVIRUS 2 (TAT 6-24 HRS) Nasopharyngeal Nasopharyngeal Swab     Status: None   Collection Time: 08/05/20 12:51 AM   Specimen: Nasopharyngeal Swab  Result Value Ref Range Status   SARS Coronavirus 2 NEGATIVE NEGATIVE Final    Comment: (NOTE) SARS-CoV-2 target nucleic acids are NOT DETECTED.  The SARS-CoV-2 RNA is generally detectable in upper and lower respiratory specimens during the acute phase of infection. Negative results do not preclude SARS-CoV-2 infection, do not rule out co-infections with other pathogens, and should not be used as the sole basis for treatment or other patient management decisions. Negative results must be combined with clinical observations, patient history, and epidemiological information. The expected result is Negative.  Fact Sheet for Patients: SugarRoll.be  Fact Sheet for Healthcare Providers: https://www.woods-mathews.com/  This test is not yet approved or cleared by the [place] FDA and  has been authorized for detection and/or diagnosis of SARS-CoV-2 by FDA under an Emergency Use Authorization (EUA). This EUA will remain  in effect (meaning this test can be used) for the duration of the COVID-19 declaration under Se ction 564(b)(1) of the Act, 21 U.S.C. section 360bbb-3(b)(1), unless the authorization is terminated or revoked sooner.  Performed at  [facility], [place] [address], [place], [place] [address]      Medications:    allopurinol  300 mg Oral Daily   apixaban  5 mg Oral BID   atorvastatin  20 mg Oral Daily   carvedilol  12.5 mg Oral BID   Chlorhexidine Gluconate Cloth  6 each Topical Daily   docusate sodium  100 mg Oral BID   insulin aspart  0-15 Units Subcutaneous TID WC    insulin aspart  0-5 Units Subcutaneous QHS   insulin detemir  5 Units Subcutaneous Daily   losartan  50 mg Oral BID   metroNIDAZOLE  500 mg Oral Q8H   multivitamin with minerals  1 tablet Oral Daily   mupirocin ointment  1 application Nasal BID   nutrition supplement (JUVEN)  1 packet Oral BID BM   pantoprazole  40 mg Oral Daily   saccharomyces boulardii  500 mg Oral Daily   sertraline  50 mg Oral Daily   Continuous Infusions:  ampicillin-sulbactam (UNASYN) IV 3 g (08/08/20 0827)   vancomycin 1,500 mg (08/08/20 0939)      LOS: 4 days   Charlynne Cousins  [facility]  08/08/2020, 9:59 AM

## 2020-08-09 DIAGNOSIS — I96 Gangrene, not elsewhere classified: Secondary | ICD-10-CM | POA: Diagnosis not present

## 2020-08-09 LAB — BASIC METABOLIC PANEL
Anion gap: 10 (ref 5–15)
BUN: 20 mg/dL (ref 8–23)
CO2: 22 mmol/L (ref 22–32)
Calcium: 8.5 mg/dL — ABNORMAL LOW (ref 8.9–10.3)
Chloride: 104 mmol/L (ref 98–111)
Creatinine, Ser: 1 mg/dL (ref 0.61–1.24)
GFR, Estimated: >60 mL/min (ref >60)
Glucose, Bld: 101 mg/dL — ABNORMAL HIGH (ref 70–99)
Potassium: 3.7 mmol/L (ref 3.5–5.1)
Sodium: 136 mmol/L (ref 135–145)

## 2020-08-09 LAB — GLUCOSE, CAPILLARY
Glucose-Capillary: 91 mg/dL (ref 70–99)
Glucose-Capillary: 113 mg/dL — ABNORMAL HIGH (ref 70–99)
Glucose-Capillary: 112 mg/dL — ABNORMAL HIGH (ref 70–99)
Glucose-Capillary: 123 mg/dL — ABNORMAL HIGH (ref 70–99)

## 2020-08-09 MED ORDER — LOSARTAN POTASSIUM 25 MG PO TABS
25.0000 mg | ORAL_TABLET | Freq: Two times a day (BID) | ORAL | Status: DC
  Administered 2020-08-09 – 2020-08-11 (×4): 25 mg via ORAL
  Filled 2020-08-09 (×5): qty 1

## 2020-08-09 MED ORDER — TORSEMIDE 20 MG PO TABS
20.0000 mg | ORAL_TABLET | Freq: Every day | ORAL | Status: DC
  Administered 2020-08-10 – 2020-08-11 (×2): 20 mg via ORAL
  Filled 2020-08-09 (×2): qty 1

## 2020-08-09 NOTE — Progress Notes (Signed)
[facility] PROGRESS NOTE    Progress Note  [name]  [id number] DOB: 1955/06/10 DOA: 08/03/2020 PCP: Ginger Organ., MD     Brief Narrative:   [name] is an 65 y.o. male past medical history significant for follicular lymphoma morbid obesity, chronic systolic heart failure atrial fibrillation on apixaban, diabetes mellitus type 2 essential hypertension, with a recent amputation of the left great toe last year presents with a left foot infection  Patient is stable awaiting skilled nursing facility placement.  Assessment/Plan:   Diabetic foot infection with osteomyelitis of second and third toe: Status post amputation second and third toe and metatarsals on 08/06/2018 ABI no evidence of significant obstruction on the right, left ABI unreliable Podiatry is recommended to continue IV antibiotics and remains nonweightbearing. Podiatry to consult vascular as an outpatient if needed. To continue antibiotics IV on home oral tomorrow.  Podiatrist to dictate which oral antibiotics. Physical therapy evaluated the patient recommended skilled nursing facility, the patient is nonweightbearing. Patient would like inpatient rehab to evaluate his case to see if he qualifies.  Chronic systolic heart failure: With an EF to 45% appears to be compensated continue monitor. Resume ACE inhibitor and diuretic therapy.  Recheck a basic metabolic panel in the morning.  Chronic atrial fibrillation: Rate controlled Coreg, resume Eliquis.  Controlled diabetes mellitus type 2: with an A1c of 6.3, tolerating his diet his blood glucose trending up long-acting insulin.  Hyperlipidemia:  Excellent continue Lipitor.  Essential hypertension: Continue Coreg, Cozaar and torsemide.  Recent potassium supplementation.  Follicular lymphoma: In remission.  Obesity: BMI 52.   Unstageable left buttock ulcer present on admission: RN Pressure Injury Documentation: Pressure Injury 02/25/17  Unstageable - Full thickness tissue loss in which the base of the ulcer is covered by slough (yellow, tan, gray, green or brown) and/or eschar (tan, brown or black) in the wound bed. Patchy areas of partial thickness skin loss re (Active)  02/25/17 0700  Location: Buttocks  Location Orientation: Left  Staging: Unstageable - Full thickness tissue loss in which the base of the ulcer is covered by slough (yellow, tan, gray, green or brown) and/or eschar (tan, brown or black) in the wound bed.  Wound Description (Comments): Patchy areas of partial thickness skin loss related to moisture associated skin damage and fungal rash; present on admission to rehab  Present on Admission: Yes      DVT prophylaxis: lovenox Family Communication:none Status is: Inpatient  Remains inpatient appropriate because:Hemodynamically unstable  Dispo: The patient is from: Home              Anticipated d/c is to: Home              Patient currently is not medically stable to d/c.   Difficult to place patient No     Code Status:     Code Status Orders  (From admission, onward)           Start     Ordered   08/04/20 1243  Full code  Continuous        08/04/20 1243           Code Status History     Date Active Date Inactive Code Status Order ID Comments User Context   03/22/2017 1535 04/06/2017 1824 Full Code [id number]  Flora Lipps Inpatient   03/22/2017 1535 03/22/2017 1535 Full Code [id number]  Flora Lipps Inpatient   02/16/2017 2121 03/22/2017 1532 Full Code [id number]  Arnell Asal, NP ED         IV Access:   Peripheral IV   Procedures and diagnostic studies:   No results found.   Medical Consultants:   None.   Subjective:    [name] pain is controlled had a bowel movement.  Objective:    Vitals:   08/08/20 1949 08/09/20 0017 08/09/20 0355 08/09/20 0839  BP: (!) 115/51 110/76 131/64 (!) 104/92  Pulse: (!) 56 (!) 51 (!) 55 73  Resp: '16 20 20 18   '$ Temp: 98 F (36.7 C) 98 F (36.7 C) 98.1 F (36.7 C) 97.8 F (36.6 C)  TempSrc: Oral Oral Oral Oral  SpO2: 96% 98% 96% 97%  Weight:      Height:       SpO2: 97 % O2 Flow Rate (L/min): 2 L/min   Intake/Output Summary (Last 24 hours) at 08/09/2020 0953 Last data filed at 08/09/2020 0326 Gross per 24 hour  Intake --  Output 1470 ml  Net -1470 ml    Filed Weights   08/03/20 2047 08/05/20 1449  Weight: (!) 170 kg (!) 155.6 kg    Exam: General exam: In no acute distress. Respiratory system: Good air movement and clear to auscultation. Cardiovascular system: S1 & S2 heard, RRR. No JVD. Gastrointestinal system: Abdomen is nondistended, soft and nontender.  Extremities: No pedal edema. Skin: No rashes, lesions or ulcers  Data Reviewed:    Labs: Basic Metabolic Panel: Recent Labs  Lab 08/03/20 2044 08/05/20 0119 08/09/20 0053  NA 134* 135 136  K 3.8 4.0 3.7  CL 101 107 104  CO2 23 21* 22  GLUCOSE 109* 134* 101*  BUN 26* 18 20  CREATININE 1.08 0.83 1.00  CALCIUM 9.1 8.6* 8.5*    GFR Estimated Creatinine Clearance: 111.9 mL/min (by C-G formula based on SCr of 1 mg/dL). Liver Function Tests: Recent Labs  Lab 08/03/20 2044  AST 22  ALT 21  ALKPHOS 134*  BILITOT 1.4*  PROT 7.6  ALBUMIN 3.2*    No results for input(s): LIPASE, AMYLASE in the last 168 hours. No results for input(s): AMMONIA in the last 168 hours. Coagulation profile No results for input(s): INR, PROTIME in the last 168 hours. COVID-19 Labs  No results for input(s): DDIMER, FERRITIN, LDH, CRP in the last 72 hours.   Lab Results  Component Value Date   SARSCOV2NAA NEGATIVE 08/05/2020   SARSCOV2NAA NEGATIVE 07/22/2020   [place] NEGATIVE 07/06/2020   [place] NEGATIVE 05/03/2019    CBC: Recent Labs  Lab 08/03/20 2044 08/05/20 0119  WBC 10.6* 7.5  NEUTROABS 8.2*  --   HGB 13.6 12.7*  HCT 42.0 40.1  MCV 97.0 97.8  PLT 270 243    Cardiac Enzymes: No results for  input(s): CKTOTAL, CKMB, CKMBINDEX, TROPONINI in the last 168 hours. BNP (last 3 results) No results for input(s): PROBNP in the last 8760 hours. CBG: Recent Labs  Lab 08/08/20 0817 08/08/20 1140 08/08/20 1707 08/08/20 2056 08/09/20 0838  GLUCAP 108* 136* 133* 93 91    D-Dimer: No results for input(s): DDIMER in the last 72 hours. Hgb A1c: No results for input(s): HGBA1C in the last 72 hours.  Lipid Profile: No results for input(s): CHOL, HDL, LDLCALC, TRIG, CHOLHDL, LDLDIRECT in the last 72 hours. Thyroid function studies: No results for input(s): TSH, T4TOTAL, T3FREE, THYROIDAB in the last 72 hours.  Invalid input(s): FREET3 Anemia work up: No results for input(s): VITAMINB12, FOLATE, FERRITIN, TIBC, IRON, RETICCTPCT in  the last 72 hours. Sepsis Labs: Recent Labs  Lab 08/03/20 2044 08/05/20 0119  WBC 10.6* 7.5  LATICACIDVEN 1.0  --     Microbiology Recent Results (from the past 240 hour(s))  Blood culture (routine x 2)     Status: None   Collection Time: 08/03/20  8:45 PM   Specimen: BLOOD  Result Value Ref Range Status   Specimen Description BLOOD LEFT ARM  Final   Special Requests   Final    BOTTLES DRAWN AEROBIC AND ANAEROBIC Blood Culture adequate volume   Culture   Final    NO GROWTH 5 DAYS Performed at [facility], [address], [place], [place] [address]    Report Status 08/08/2020 FINAL  Final  Blood culture (routine x 2)     Status: None   Collection Time: 08/03/20  8:45 PM   Specimen: BLOOD  Result Value Ref Range Status   Specimen Description BLOOD LEFT ARM  Final   Special Requests   Final    BOTTLES DRAWN AEROBIC ONLY Blood Culture results may not be optimal due to an excessive volume of blood received in culture bottles   Culture   Final    NO GROWTH 5 DAYS Performed at [facility], [place] [address] [place], [place] [address]    Report Status 08/08/2020 FINAL  Final  Surgical pcr screen     Status: Abnormal   Collection  Time: 08/05/20 12:23 AM   Specimen: Nasopharyngeal Swab; Nasal Swab  Result Value Ref Range Status   MRSA, PCR NEGATIVE NEGATIVE Final   Staphylococcus aureus POSITIVE (A) NEGATIVE Final    Comment: (NOTE) The Xpert SA Assay (FDA approved for NASAL specimens in patients 58 years of age and older), is one component of a comprehensive surveillance program. It is not intended to diagnose infection nor to guide or monitor treatment. Performed at [facility], [place] [address] [place], [place] [address]   SARS CORONAVIRUS 2 (TAT 6-24 HRS) Nasopharyngeal Nasopharyngeal Swab     Status: None   Collection Time: 08/05/20 12:51 AM   Specimen: Nasopharyngeal Swab  Result Value Ref Range Status   SARS Coronavirus 2 NEGATIVE NEGATIVE Final    Comment: (NOTE) SARS-CoV-2 target nucleic acids are NOT DETECTED.  The SARS-CoV-2 RNA is generally detectable in upper and lower respiratory specimens during the acute phase of infection. Negative results do not preclude SARS-CoV-2 infection, do not rule out co-infections with other pathogens, and should not be used as the sole basis for treatment or other patient management decisions. Negative results must be combined with clinical observations, patient history, and epidemiological information. The expected result is Negative.  Fact Sheet for Patients: SugarRoll.be  Fact Sheet for Healthcare Providers: https://www.woods-mathews.com/  This test is not yet approved or cleared by the [place] FDA and  has been authorized for detection and/or diagnosis of SARS-CoV-2 by FDA under an Emergency Use Authorization (EUA). This EUA will remain  in effect (meaning this test can be used) for the duration of the COVID-19 declaration under Se ction 564(b)(1) of the Act, 21 U.S.C. section 360bbb-3(b)(1), unless the authorization is terminated or revoked sooner.  Performed at [facility], [place] [address] [place], [place] [address]      Medications:    allopurinol  300 mg Oral Daily   apixaban  5 mg Oral BID   atorvastatin  20 mg Oral Daily   carvedilol  12.5 mg Oral BID  Chlorhexidine Gluconate Cloth  6 each Topical Daily   docusate sodium  100 mg Oral BID   insulin aspart  0-15 Units Subcutaneous TID WC   insulin aspart  0-5 Units Subcutaneous QHS   insulin detemir  5 Units Subcutaneous Daily   losartan  50 mg Oral BID   multivitamin with minerals  1 tablet Oral Daily   mupirocin ointment  1 application Nasal BID   nutrition supplement (JUVEN)  1 packet Oral BID BM   pantoprazole  40 mg Oral Daily   potassium chloride SA  20 mEq Oral Daily   saccharomyces boulardii  500 mg Oral Daily   sertraline  50 mg Oral Daily   torsemide  40 mg Oral Daily   Continuous Infusions:  ampicillin-sulbactam (UNASYN) IV 3 g (08/09/20 0910)   vancomycin 1,500 mg (08/08/20 2137)      LOS: 5 days   Charlynne Cousins  [facility]  08/09/2020, 9:53 AM

## 2020-08-10 ENCOUNTER — Encounter: Payer: Self-pay | Admitting: Internal Medicine

## 2020-08-10 ENCOUNTER — Other Ambulatory Visit (HOSPITAL_COMMUNITY): Payer: Self-pay | Admitting: Internal Medicine

## 2020-08-10 LAB — SURGICAL PATHOLOGY

## 2020-08-10 LAB — GLUCOSE, CAPILLARY
Glucose-Capillary: 91 mg/dL (ref 70–99)
Glucose-Capillary: 112 mg/dL — ABNORMAL HIGH (ref 70–99)
Glucose-Capillary: 99 mg/dL (ref 70–99)
Glucose-Capillary: 121 mg/dL — ABNORMAL HIGH (ref 70–99)

## 2020-08-10 MED ORDER — DOXYCYCLINE HYCLATE 100 MG PO TABS: 100.0000 mg | ORAL_TABLET | Freq: Two times a day (BID) | ORAL | 0 refills | Status: DC

## 2020-08-10 MED ORDER — AMOXICILLIN-POT CLAVULANATE 875-125 MG PO TABS: 1.0000 | ORAL_TABLET | Freq: Two times a day (BID) | ORAL | 0 refills | Status: DC

## 2020-08-10 NOTE — Discharge Summary (Signed)
Physician Discharge Summary  [name] [id number] DOB: 1955-06-13 DOA: 08/03/2020  PCP: Ginger Organ., MD  Admit date: 08/03/2020 Discharge date: 08/10/2020  Admitted From: Home Disposition:    Recommendations for Outpatient Follow-up:  Follow up with Podiatrist  in 1-2 weeks podiatrist to refer to vascular surgeon as an outpatient. Please obtain BMP/CBC in one week   Home Health:No Equipment/Devices:none  Discharge Condition:Stable CODE STATUS:Full Diet recommendation: Heart Healthy  Brief/Interim Summary:  65 y.o. male past medical history significant for follicular lymphoma morbid obesity, chronic systolic heart failure atrial fibrillation on apixaban, diabetes mellitus type 2 essential hypertension, with a recent amputation of the left great toe last year presents with a left foot infection  Discharge Diagnoses:  Principal Problem:   Gangrene ([place]) Active Problems:   Follicular lymphoma grade II ([place])   Morbid obesity ([place])   Diabetes mellitus, new onset ([place])   Chronic systolic HF (heart failure) ([facility])   Permanent atrial fibrillation ([facility])  Diabetic foot infection with osteomyelitis second and third toe: Podiatrist was consulted he status post second and third toe and metatarsal amputation on 08/05/2020. ABI showed no significant obstructions the left ABI was unreliable. Podiatrist agree with IV antibiotic sensation to oral antibiotics and follow-up with them in 2 weeks he will continue antibiotics for 2 weeks. He is nonweightbearing.  Podiatrist will consult vascular is analysis patient if needed.  Chronic systolic heart failure: With a last EF of 45% appears to be compensated.  No changes made to his medication.  Diabetes mellitus type 2 controlled: with an A1c of 6.3 blood glucose fairly controlled continue current regimen no changes made.  Hyperlipidemia: Excellent control continue Lipitor.  Essential hypertension: No changes to his medication  continue current regimen.  Follicular lymphoma: In remission.   Discharge Instructions  Discharge Instructions     Diet - low sodium heart healthy   Complete by: As directed    Increase activity slowly   Complete by: As directed    No wound care   Complete by: As directed       Allergies as of 08/10/2020       Reactions   Latex Hives, Swelling        Medication List     STOP taking these medications    multivitamin with minerals Tabs tablet   Santyl ointment Generic drug: collagenase       TAKE these medications    acetaminophen 325 MG tablet Commonly known as: TYLENOL Take 650 mg by mouth every 6 (six) hours as needed for moderate pain.   albuterol 108 (90 Base) MCG/ACT inhaler Commonly known as: VENTOLIN HFA Inhale 2 puffs into the lungs every 6 (six) hours as needed for wheezing or shortness of breath.   allopurinol 300 MG tablet Commonly known as: ZYLOPRIM Take 300 mg by mouth daily.   amoxicillin-clavulanate 875-125 MG tablet Commonly known as: Augmentin Take 1 tablet by mouth 2 (two) times daily for 14 days.   ascorbic acid 500 MG tablet Commonly known as: VITAMIN C Take 1 tablet (500 mg total) by mouth 3 (three) times daily. What changed: when to take this   atorvastatin 20 MG tablet Commonly known as: LIPITOR TAKE 1 TABLET BY MOUTH EVERY DAY *PT OVERDUE FOR OFFICE VISIT* What changed: See the new instructions.   carvedilol 12.5 MG tablet Commonly known as: COREG Take 1 tablet (12.5 mg total) by mouth 2 (two) times daily.   cholecalciferol 25 MCG (1000 UNIT) tablet Commonly known as:  VITAMIN D Take 1,000 Units by mouth daily.   [employer] Digestive Probiotic 250 MG capsule Generic drug: saccharomyces boulardii Take 500 mg by mouth daily.   doxycycline 100 MG tablet Commonly known as: VIBRA-TABS Take 1 tablet (100 mg total) by mouth 2 (two) times daily for 14 days.   Eliquis 5 MG Tabs tablet Generic drug: apixaban TAKE 1 TABLET BY  MOUTH TWICE A DAY What changed: how much to take   HYDROcodone-acetaminophen 10-325 MG tablet Commonly known as: Norco Take 1 tablet by mouth every 6 (six) hours as needed for up to 7 days.   Klor-Con M20 20 MEQ tablet Generic drug: potassium chloride SA TAKE 1 TABLET BY MOUTH EVERY DAY What changed: how much to take   losartan 50 MG tablet Commonly known as: COZAAR TAKE 1 TABLET BY MOUTH EVERY MORNING AND AT BEDTIME What changed: See the new instructions.   Omega 3 340 MG Cpdr Take 1 tablet by mouth 2 (two) times daily.   omeprazole 40 MG capsule Commonly known as: PRILOSEC Take 40 mg by mouth daily.   sertraline 50 MG tablet Commonly known as: ZOLOFT Take 1 tablet (50 mg total) by mouth daily.   torsemide 20 MG tablet Commonly known as: DEMADEX Take 2 tablets by mouth daily alternating with 1 tablet by mouth daily.   vitamin B-12 1000 MCG tablet Commonly known as: CYANOCOBALAMIN Take 1,000 mcg by mouth daily.        Allergies  Allergen Reactions   Latex Hives and Swelling    Consultations: Podiatrist   Procedures/Studies: MR FOOT LEFT WO CONTRAST  Result Date: 08/04/2020 CLINICAL DATA:  Osteomyelitis, foot. Chronic lower extremity lymphedema. Chronic nonhealing wound of the left foot. Left great toe amputation in April of 2021 EXAM: MRI OF THE LEFT FOOT WITHOUT CONTRAST TECHNIQUE: Multiplanar, multisequence MR imaging of the left forefoot was performed. No intravenous contrast was administered. COMPARISON:  X-ray 08/03/2020 FINDINGS: Technical note: Despite efforts by the technologist and patient, motion artifact is present on today's exam and could not be eliminated. This reduces exam sensitivity and specificity. Bones/Joint/Cartilage Status post transmetatarsal amputation of the first ray at the level of the distal metatarsal neck. There is bone marrow edema within the distal 1.9 cm of the residual first metatarsal (series 7, image 12). Subtle intermediate T1  signal in the marrow at this location without confluent low T1 signal change. Prominent bone marrow edema throughout the proximal, middle, and distal phalanx of the second toe with associated intermediate-low T1 marrow signal (series 6, images 10-11). There is lateral subluxation of the second toe at the DIP joint. Marrow edema is also present within the proximal, middle, and distal phalanx of the third toe with confluent low T1 signal change within the proximal and middle phalanx (series 6, images 13-14). Preserved T1 marrow signal of the remaining visualized forefoot. Ligaments Intact Lisfranc ligament. Collateral ligaments of the lesser MTP joints are intact. Muscles and Tendons Chronic denervation changes of the intrinsic foot musculature. Soft tissues Prominent circumferential soft tissue swelling with dorsal subcutaneous edema. Probable soft tissue ulceration at the second toe along its medial aspect. Soft tissue thickening/fibrosis overlies the residual first metatarsal resection margin. No organized fluid collection or abscess. IMPRESSION: 1. Findings most consistent with acute osteomyelitis of the second and third toes, as above. 2. Status post transmetatarsal amputation of the first ray. Bone marrow edema within the distal 1.9 cm of the residual first metatarsal may reflect reactive osteitis versus early acute osteomyelitis. 3. Prominent  circumferential soft tissue swelling with dorsal subcutaneous edema. Probable soft tissue ulceration at the second toe along its medial aspect. No organized fluid collection or abscess. Electronically Signed   By: Davina Poke D.O.   On: 08/04/2020 15:04   DG Chest Port 1 View  Result Date: 07/22/2020 CLINICAL DATA:  65 year old male with history of shortness of breath. EXAM: PORTABLE CHEST 1 VIEW COMPARISON:  Chest x-ray 07/06/2020. FINDINGS: Low lung volumes. Diffuse interstitial prominence and peribronchial cuffing, markedly worsened compared to the prior study.  No confluent consolidative airspace disease. No pleural effusions. No pneumothorax. No evidence of pulmonary edema. Heart size is mildly enlarged. Upper mediastinal contours are within normal limits. IMPRESSION: 1. Worsening diffuse interstitial prominence and peribronchial cuffing, concerning for severe acute bronchitis. 2. Mild cardiomegaly. Electronically Signed   By: Vinnie Langton M.D.   On: 07/22/2020 06:24   DG Foot Complete Left  Result Date: 08/05/2020 CLINICAL DATA:  64 year old male status post amputation of the distal foot. EXAM: LEFT FOOT - COMPLETE 3+ VIEW COMPARISON:  Left foot radiograph dated 08/03/2020. FINDINGS: Transmetatarsal amputation of the foot. The surgical edges appear sharp. There is no acute fracture or dislocation. The bones are osteopenic. There is extensive degenerative and Charcot changes of the midfoot and tarsometatarsal joints. There is diffuse soft tissue swelling. Subcutaneous calcification of the distal calf may be related to chronic venous stasis or underlying connective tissue disorder. Skin staples noted over the stump. IMPRESSION: Transmetatarsal amputation of the foot. Electronically Signed   By: Anner Crete M.D.   On: 08/05/2020 19:42   DG Foot Complete Left  Result Date: 08/05/2020 Please see detailed radiograph report in office note.  DG Foot Complete Left  Result Date: 08/03/2020 CLINICAL DATA:  65 year old male with left foot infection. EXAM: LEFT FOOT - COMPLETE 3+ VIEW COMPARISON:  Earlier radiograph dated 08/03/2020. FINDINGS: Amputation of the first ray distal to the metatarsal head. No definite acute fracture. Evaluation however is limited due to osteopenia and overlying dressing. There is subluxed appearance of the distal interphalangeal joint of the second toe which is suboptimally evaluated on this radiograph. The distal phalanx of the second toe appears lateral to the middle phalanx. There is significant degenerative changes of the  tarsometatarsal joints. There is diffuse soft tissue swelling and subcutaneous edema. Calcification of the skin noted in the distal leg. IMPRESSION: 1. No definite acute fracture. 2. Subluxation of the distal interphalangeal joint of the second toe, suboptimally evaluated on this radiograph. 3. Diffuse soft tissue swelling and subcutaneous edema. Electronically Signed   By: Anner Crete M.D.   On: 08/03/2020 21:28   VAS [place] ABI WITH/WO TBI  Result Date: 08/07/2020  LOWER EXTREMITY DOPPLER STUDY Patient Name:  AVRIAN BALILES  Date of Exam:   08/06/2020 Medical Rec #: [id number]     Accession #:    [id number] Date of Birth: [birth year]     Patient Gender: M Patient Age:   65Y Exam Location:  [facility] Procedure:      VAS [place] ABI WITH/WO TBI Referring Phys: [id number] JENNIFER YATES --------------------------------------------------------------------------------  Indications: Ulceration, and left transmetatarsal amputation. High Risk         Hypertension, hyperlipidemia, Diabetes, past history of Factors:          smoking. Other Factors: Afib, CHF, venous insufficiency,.  Comparison Study: Previous exam 10/01/18 Performing Technologist: Rogelia Rohrer RVT, RDMS  Examination Guidelines: A complete evaluation includes at minimum, Doppler waveform signals and systolic blood pressure reading at  the level of bilateral brachial, anterior tibial, and posterior tibial arteries, when vessel segments are accessible. Bilateral testing is considered an integral part of a complete examination. Photoelectric Plethysmograph (PPG) waveforms and toe systolic pressure readings are included as required and additional duplex testing as needed. Limited examinations for reoccurring indications may be performed as noted.  ABI Findings: +---------+------------------+-----+---------+--------+ Right    Rt Pressure (mmHg)IndexWaveform Comment  +---------+------------------+-----+---------+--------+ Brachial 136                     triphasic         +---------+------------------+-----+---------+--------+ PTA      172               1.26 biphasic          +---------+------------------+-----+---------+--------+ DP       157               1.15 biphasic          +---------+------------------+-----+---------+--------+ Great Toe89                0.65 Abnormal Mild     +---------+------------------+-----+---------+--------+ +---------+------------------+-----+----------+-------+ Left     Lt Pressure (mmHg)IndexWaveform  Comment +---------+------------------+-----+----------+-------+ Brachial                        triphasic IV      +---------+------------------+-----+----------+-------+ PTA      139               1.02 monophasic        +---------+------------------+-----+----------+-------+ DP       121               0.89 monophasic        +---------+------------------+-----+----------+-------+ Great Toe                                 TMA     +---------+------------------+-----+----------+-------+ +-------+-----------+-----------+------------+------------+ ABI/TBIToday's ABIToday's TBIPrevious ABIPrevious TBI +-------+-----------+-----------+------------+------------+ Right  1.26       0.65       1.04        0.62         +-------+-----------+-----------+------------+------------+ Left   1.15       TMA        1.06        0.76         +-------+-----------+-----------+------------+------------+  Summary: Right: Resting right ankle-brachial index is within normal range. No evidence of significant right lower extremity arterial disease. The right toe-brachial index is abnormal, indicating small vessel disease. Left: ABIs are unreliable. ABI value indicates normal resting flow however, monophasic waveforms in presence of diabetic patient indicative of vessel calcification which may underestimate level of disease. Post TMA - unable to obtain toe pressures.  *See table(s) above for  measurements and observations.  Electronically signed by Deitra Mayo MD on 08/07/2020 at 7:54:56 AM.    Final    (Echo, Carotid, EGD, Colonoscopy, ERCP)    Subjective: No complaints feels great  Discharge Exam: Vitals:   08/10/20 0502 08/10/20 0734  BP: 138/68 134/60  Pulse: (!) 54 (!) 58  Resp: 16 17  Temp: 98.2 F (36.8 C) 98.1 F (36.7 C)  SpO2: 95% 96%   Vitals:   08/09/20 0839 08/09/20 2041 08/10/20 0502 08/10/20 0734  BP: (!) 104/92 135/65 138/68 134/60  Pulse: 73 (!) 52 (!) 54 (!) 58  Resp: '18 18 16 17  '$ Temp:  97.8 F (36.6 C) 98.1 F (36.7 C) 98.2 F (36.8 C) 98.1 F (36.7 C)  TempSrc: Oral Oral Oral Oral  SpO2: 97% 97% 95% 96%  Weight:      Height:        General: Pt is alert, awake, not in acute distress Cardiovascular: RRR, S1/S2 +, no rubs, no gallops Respiratory: CTA bilaterally, no wheezing, no rhonchi Abdominal: Soft, NT, ND, bowel sounds + Extremities: no edema, no cyanosis    The results of significant diagnostics from this hospitalization (including imaging, microbiology, ancillary and laboratory) are listed below for reference.     Microbiology: Recent Results (from the past 240 hour(s))  Blood culture (routine x 2)     Status: None   Collection Time: 08/03/20  8:45 PM   Specimen: BLOOD  Result Value Ref Range Status   Specimen Description BLOOD LEFT ARM  Final   Special Requests   Final    BOTTLES DRAWN AEROBIC AND ANAEROBIC Blood Culture adequate volume   Culture   Final    NO GROWTH 5 DAYS Performed at [facility], [address] [place], [place] [address]    Report Status 08/08/2020 FINAL  Final  Blood culture (routine x 2)     Status: None   Collection Time: 08/03/20  8:45 PM   Specimen: BLOOD  Result Value Ref Range Status   Specimen Description BLOOD LEFT ARM  Final   Special Requests   Final    BOTTLES DRAWN AEROBIC ONLY Blood Culture results may not be optimal due to an excessive volume of blood received in  culture bottles   Culture   Final    NO GROWTH 5 DAYS Performed at [facility], [place] [address], [place], [place] [address]    Report Status 08/08/2020 FINAL  Final  Surgical pcr screen     Status: Abnormal   Collection Time: 08/05/20 12:23 AM   Specimen: Nasopharyngeal Swab; Nasal Swab  Result Value Ref Range Status   MRSA, PCR NEGATIVE NEGATIVE Final   Staphylococcus aureus POSITIVE (A) NEGATIVE Final    Comment: (NOTE) The Xpert SA Assay (FDA approved for NASAL specimens in patients 13 years of age and older), is one component of a comprehensive surveillance program. It is not intended to diagnose infection nor to guide or monitor treatment. Performed at [facility], [place] [address] [place], [place] [address]   SARS CORONAVIRUS 2 (TAT 6-24 HRS) Nasopharyngeal Nasopharyngeal Swab     Status: None   Collection Time: 08/05/20 12:51 AM   Specimen: Nasopharyngeal Swab  Result Value Ref Range Status   SARS Coronavirus 2 NEGATIVE NEGATIVE Final    Comment: (NOTE) SARS-CoV-2 target nucleic acids are NOT DETECTED.  The SARS-CoV-2 RNA is generally detectable in upper and lower respiratory specimens during the acute phase of infection. Negative results do not preclude SARS-CoV-2 infection, do not rule out co-infections with other pathogens, and should not be used as the sole basis for treatment or other patient management decisions. Negative results must be combined with clinical observations, patient history, and epidemiological information. The expected result is Negative.  Fact Sheet for Patients: SugarRoll.be  Fact Sheet for Healthcare Providers: https://www.woods-mathews.com/  This test is not yet approved or cleared by the [place] FDA and  has been authorized for detection and/or diagnosis of SARS-CoV-2 by FDA under an Emergency Use Authorization (EUA). This EUA will remain  in effect (meaning this test can be  used) for the duration  of the COVID-19 declaration under Se ction 564(b)(1) of the Act, 21 U.S.C. section 360bbb-3(b)(1), unless the authorization is terminated or revoked sooner.  Performed at [facility], [place] [address], [place], [place] [address]      Labs: BNP (last 3 results) Recent Labs    07/06/20 1009 07/22/20 0603 07/27/20 1605  BNP 917.2* 818.2* [id number]*   Basic Metabolic Panel: Recent Labs  Lab 08/03/20 2044 08/05/20 0119 08/09/20 0053  NA 134* 135 136  K 3.8 4.0 3.7  CL 101 107 104  CO2 23 21* 22  GLUCOSE 109* 134* 101*  BUN 26* 18 20  CREATININE 1.08 0.83 1.00  CALCIUM 9.1 8.6* 8.5*   Liver Function Tests: Recent Labs  Lab 08/03/20 2044  AST 22  ALT 21  ALKPHOS 134*  BILITOT 1.4*  PROT 7.6  ALBUMIN 3.2*   No results for input(s): LIPASE, AMYLASE in the last 168 hours. No results for input(s): AMMONIA in the last 168 hours. CBC: Recent Labs  Lab 08/03/20 2044 08/05/20 0119  WBC 10.6* 7.5  NEUTROABS 8.2*  --   HGB 13.6 12.7*  HCT 42.0 40.1  MCV 97.0 97.8  PLT 270 243   Cardiac Enzymes: No results for input(s): CKTOTAL, CKMB, CKMBINDEX, TROPONINI in the last 168 hours. BNP: Invalid input(s): POCBNP CBG: Recent Labs  Lab 08/09/20 0838 08/09/20 1227 08/09/20 1725 08/09/20 2043 08/10/20 0729  GLUCAP 91 113* 112* 123* 91   D-Dimer No results for input(s): DDIMER in the last 72 hours. Hgb A1c No results for input(s): HGBA1C in the last 72 hours. Lipid Profile No results for input(s): CHOL, HDL, LDLCALC, TRIG, CHOLHDL, LDLDIRECT in the last 72 hours. Thyroid function studies No results for input(s): TSH, T4TOTAL, T3FREE, THYROIDAB in the last 72 hours.  Invalid input(s): FREET3 Anemia work up No results for input(s): VITAMINB12, FOLATE, FERRITIN, TIBC, IRON, RETICCTPCT in the last 72 hours. Urinalysis    Component Value Date/Time   COLORURINE YELLOW 03/22/2017 1700   APPEARANCEUR CLEAR 03/22/2017 1700   LABSPEC 1.008  03/22/2017 1700   PHURINE 8.0 03/22/2017 1700   GLUCOSEU NEGATIVE 03/22/2017 1700   HGBUR NEGATIVE 03/22/2017 1700   BILIRUBINUR NEGATIVE 03/22/2017 1700   KETONESUR NEGATIVE 03/22/2017 1700   PROTEINUR NEGATIVE 03/22/2017 1700   NITRITE NEGATIVE 03/22/2017 1700   LEUKOCYTESUR SMALL (A) 03/22/2017 1700   Sepsis Labs Invalid input(s): PROCALCITONIN,  WBC,  LACTICIDVEN Microbiology Recent Results (from the past 240 hour(s))  Blood culture (routine x 2)     Status: None   Collection Time: 08/03/20  8:45 PM   Specimen: BLOOD  Result Value Ref Range Status   Specimen Description BLOOD LEFT ARM  Final   Special Requests   Final    BOTTLES DRAWN AEROBIC AND ANAEROBIC Blood Culture adequate volume   Culture   Final    NO GROWTH 5 DAYS Performed at [facility], [place] [address], [place], [place] [address]    Report Status 08/08/2020 FINAL  Final  Blood culture (routine x 2)     Status: None   Collection Time: 08/03/20  8:45 PM   Specimen: BLOOD  Result Value Ref Range Status   Specimen Description BLOOD LEFT ARM  Final   Special Requests   Final    BOTTLES DRAWN AEROBIC ONLY Blood Culture results may not be optimal due to an excessive volume of blood received in culture bottles   Culture   Final    NO GROWTH 5 DAYS Performed  at [facility], [place] [address], [place], [place] [address]    Report Status 08/08/2020 FINAL  Final  Surgical pcr screen     Status: Abnormal   Collection Time: 08/05/20 12:23 AM   Specimen: Nasopharyngeal Swab; Nasal Swab  Result Value Ref Range Status   MRSA, PCR NEGATIVE NEGATIVE Final   Staphylococcus aureus POSITIVE (A) NEGATIVE Final    Comment: (NOTE) The Xpert SA Assay (FDA approved for NASAL specimens in patients 84 years of age and older), is one component of a comprehensive surveillance program. It is not intended to diagnose infection nor to guide or monitor treatment. Performed at [facility], [place] [address]  [place], [place] [address]   SARS CORONAVIRUS 2 (TAT 6-24 HRS) Nasopharyngeal Nasopharyngeal Swab     Status: None   Collection Time: 08/05/20 12:51 AM   Specimen: Nasopharyngeal Swab  Result Value Ref Range Status   SARS Coronavirus 2 NEGATIVE NEGATIVE Final    Comment: (NOTE) SARS-CoV-2 target nucleic acids are NOT DETECTED.  The SARS-CoV-2 RNA is generally detectable in upper and lower respiratory specimens during the acute phase of infection. Negative results do not preclude SARS-CoV-2 infection, do not rule out co-infections with other pathogens, and should not be used as the sole basis for treatment or other patient management decisions. Negative results must be combined with clinical observations, patient history, and epidemiological information. The expected result is Negative.  Fact Sheet for Patients: SugarRoll.be  Fact Sheet for Healthcare Providers: https://www.woods-mathews.com/  This test is not yet approved or cleared by the [place] FDA and  has been authorized for detection and/or diagnosis of SARS-CoV-2 by FDA under an Emergency Use Authorization (EUA). This EUA will remain  in effect (meaning this test can be used) for the duration of the COVID-19 declaration under Se ction 564(b)(1) of the Act, 21 U.S.C. section 360bbb-3(b)(1), unless the authorization is terminated or revoked sooner.  Performed at [facility], [place] [address], [place], [place] [address]      SIGNED:   Charlynne Cousins, MD  [facility] 08/10/2020, 8:58 AM Pager   If 7PM-7AM, please contact night-coverage [email or web address] Password [email or web address]

## 2020-08-10 NOTE — Progress Notes (Signed)
Occupational Therapy Evaluation Patient Details Name: [name] MRN: [id number] DOB: 06/14/1955 Today's Date: 08/10/2020    History of Present Illness 65 yo male with onset of L foot gangrene was admitted, after hx of great toe amp, for transmet amp.  Pt is NWB and MD hoping to send home.  PMHx:  L buttock ulcer, CHF, a-fib, DM, HLD, follicular lymphoma, diverticulosis, gout, sepsis, PNA, Covid 19, tubular adenoma of colon, venous stasis ulcers   Clinical Impression   Kanav was evaluated s/p the above amputation. PTA pt was mod I with all ADL/IADLs. Upon evaluation pt was max A +2 for sit<>stand and for stand pivot transfer from bed>chair. Pt had increased difficulty standing from a lower surface and was observed not maintaining WB status despite max A and vc. Pt is supervision for bed mobility. He performs upper body ADLs with set up in sitting and up to max A +2 for lower body ADLs. Pt encouraged to complete A/P transfers with staff until WB with sit<>stands improves. Pt benefits from continued OT to progress towards his mod I baseline. Recommend pt d/c tp CIR for intensive therapies to increase safety and function.     Follow Up Recommendations  CIR;Supervision/Assistance - 24 hour    Equipment Recommendations  3 in 1 bedside commode;Tub/shower seat;Wheelchair (measurements OT)    Recommendations for Other Services Rehab consult     Precautions / Restrictions Precautions Precautions: Fall Precaution Comments: venous stasis Required Braces or Orthoses: Other Brace Other Brace: L darco shoe Restrictions Weight Bearing Restrictions: Yes LLE Weight Bearing: Non weight bearing Other Position/Activity Restrictions: Pt has difficulty maintaining weight bearing in standing and fatigues quickly.      Mobility Bed Mobility Overal bed mobility: Needs Assistance Bed Mobility: Sit to Supine     Supine to sit: Supervision     General bed mobility comments: supervision for safety     Transfers Overall transfer level: Needs assistance Equipment used: Rolling walker (2 wheeled);2 person hand held assist Transfers: Sit to/from Stand;Stand Pivot Transfers Sit to Stand: Max assist;+2 physical assistance;+2 safety/equipment Stand pivot transfers: Max assist;+2 physical assistance;+2 safety/equipment       General transfer comment: cues for hand placement, body positioning and WB status - pt did not adhere to WB status well during sit<>stand ttransition and when pivoting R foot - encouraged a/p transfers or steady transfers with +2 A    Balance Overall balance assessment: Needs assistance Sitting-balance support: Single extremity supported Sitting balance-Leahy Scale: Good     Standing balance support: Bilateral upper extremity supported Standing balance-Leahy Scale: Poor               ADL either performed or assessed with clinical judgement   ADL Overall ADL's : Needs assistance/impaired Eating/Feeding: Set up;Sitting   Grooming: Set up;Sitting   Upper Body Bathing: Set up;Sitting   Lower Body Bathing: Maximal assistance;Sit to/from stand;+2 for safety/equipment   Upper Body Dressing : Set up;Sitting   Lower Body Dressing: Maximal assistance;+2 for physical assistance;+2 for safety/equipment;Sit to/from stand   Toilet Transfer: Maximal assistance;+2 for safety/equipment;+2 for physical assistance;BSC;Stand-pivot;RW Toilet Transfer Details (indicate cue type and reason): +1 assist for holding foot of of ground to maintain WB status Toileting- Clothing Manipulation and Hygiene: Maximal assistance;+2 for physical assistance;+2 for safety/equipment;Sit to/from stand       Functional mobility during ADLs: Maximal assistance;+2 for physical assistance;+2 for safety/equipment;Rolling walker;Cueing for safety General ADL Comments: Pt  encouraged to complete a/p transfers for [facility] and chair transfers  to maintain WB status      Pertinent Vitals/Pain Pain  Assessment: Faces Faces Pain Scale: Hurts a little bit Pain Location: L foot Pain Descriptors / Indicators: Operative site guarding;Guarding Pain Intervention(s): Monitored during session     Hand Dominance Right   Extremity/Trunk Assessment Upper Extremity Assessment Upper Extremity Assessment: Overall WFL for tasks assessed   Lower Extremity Assessment Lower Extremity Assessment: Defer to PT evaluation   Cervical / Trunk Assessment Cervical / Trunk Assessment: Normal (incrased body habitus)   Communication Communication Communication: No difficulties   Cognition Arousal/Alertness: Awake/alert Behavior During Therapy: WFL for tasks assessed/performed Overall Cognitive Status: Impaired/Different from baseline Area of Impairment: Safety/judgement                         Safety/Judgement: Decreased awareness of safety;Decreased awareness of deficits     General Comments: Pt is motivated to get better and home   General Comments  Pt stated that he transferred by him self to [facility] earlier today, likely did not maintain WB status. 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[employer], working as hairdresser        OT Problem List: Decreased strength;Decreased range of motion;Decreased activity tolerance;Impaired balance (sitting and/or standing);Decreased safety awareness;Decreased knowledge of  use of DME or AE;Decreased knowledge of precautions;Pain      OT Treatment/Interventions: Self-care/ADL training;Therapeutic exercise;DME and/or AE instruction;Cognitive remediation/compensation;Visual/perceptual remediation/compensation    OT Goals(Current goals can be found in the care plan section) Acute Rehab OT Goals Patient Stated Goal: to get to rehab and then home OT Goal Formulation: With patient Time For Goal Achievement: 08/24/20 Potential to Achieve Goals: Fair ADL Goals Pt Will Perform Lower Body Bathing: with min assist;sit to/from stand Pt Will Perform Lower Body Dressing: with min assist;sit to/from stand Pt Will Transfer to Toilet: with min guard assist;stand pivot transfer;bedside commode Pt/caregiver will Perform Home Exercise Program: Increased strength;Both right and left upper extremity;With Supervision;With written HEP provided  OT Frequency: Min 2X/week    AM-PAC OT "6 Clicks" Daily Activity     Outcome Measure Help from another person eating meals?: None Help from another person taking care of personal grooming?: A Little Help from another person toileting, which includes using toliet, bedpan, or urinal?: A Lot Help from another person bathing (including washing, rinsing, drying)?: A Lot Help from another person to put on and taking off regular upper body clothing?: A Little Help from another person to put on and taking off regular lower body clothing?: A Lot 6 Click Score: 16   End of Session Equipment Utilized During Treatment: Gait belt;Rolling walker Nurse Communication: Mobility status;Need for lift equipment  Activity Tolerance: Patient tolerated treatment well Patient left: in bed;with family/visitor present  OT Visit Diagnosis: Unsteadiness on feet (R26.81);Other abnormalities of gait and mobility (R26.89);Muscle weakness (generalized) (M62.81);Pain  Time: [id number] OT Time Calculation (min): 29 min Charges:  OT General Charges $OT  Visit: 1 Visit OT Evaluation $OT Eval Moderate Complexity: 1 Mod OT Treatments $Therapeutic Activity: 8-22 mins   Kenshawn Maciolek A Tahara Ruffini 08/10/2020, 4:36 PM

## 2020-08-10 NOTE — Progress Notes (Signed)
Physical Therapy Treatment Patient Details Name: [name] MRN: [id number] DOB: June 19, 1955 Today's Date: 08/10/2020    History of Present Illness 65 yo male with onset of L foot gangrene was admitted, after hx of great toe amp, for transmet amp.  Pt is NWB and MD hoping to send home.  PMHx:  L buttock ulcer, CHF, a-fib, DM, HLD, follicular lymphoma, diverticulosis, gout, sepsis, PNA, Covid 19, tubular adenoma of colon, venous stasis ulcers    PT Comments    Pt supine in bed and eager to move OOB into standing.  He was receptive to education but limited due to strength and endurance deficits.  Plan for use of B platform walker next session to improve foot clearance with hop to pattern.  Based on progress and desire to return home more independently in a shorter amount of time will update recommendations to CIR.  Pt will be an excellent candidate for aggressive rehab in a post acute setting.  Will inform supervising PT of need for change in recommendations.    Follow Up Recommendations  CIR     Equipment Recommendations  Rolling walker with 5" wheels (bariatric)    Recommendations for Other Services       Precautions / Restrictions Precautions Precautions: Fall Precaution Comments: venous stasis Required Braces or Orthoses: Other Brace Other Brace: L darco shoe Restrictions Weight Bearing Restrictions: Yes LLE Weight Bearing: Non weight bearing Other Position/Activity Restrictions: Pt has difficulty maintaining weight bearing in standing and fatigues quickly.    Mobility  Bed Mobility Overal bed mobility: Needs Assistance Bed Mobility: Supine to Sit     Supine to sit: Supervision          Transfers   Equipment used: Rolling walker (2 wheeled) Transfers: Sit to/from Stand Sit to Stand: Min assist;From elevated surface         General transfer comment: Cues for hand placement and weight bearing restriction, despite cues he has difficulty maintaining weight bearing  restriction during transition to standing.  Ambulation/Gait Ambulation/Gait assistance: Mod assist;+2 safety/equipment Gait Distance (Feet): 4 Feet (x2 trials.  Pt had difficulty with foot clearance after 4 ft of gt training and required seated surface to be moved to his backside to prepare for sitting.) Assistive device: Rolling walker (2 wheeled) Gait Pattern/deviations: Step-to pattern;Trunk flexed     General Gait Details: Decreased foot clearance on R and decreased strength to maintain weight bearing on L LE.  Performed limited short bouts of gt with step to ( hop to pattern).  Pt able to pivot without hoping to move from bed to recliner.   Stairs             Wheelchair Mobility    Modified Rankin (Stroke Patients Only)       Balance Overall balance assessment: Needs assistance   Sitting balance-Leahy Scale: Fair       Standing balance-Leahy Scale: Poor                              Cognition Arousal/Alertness: Awake/alert Behavior During Therapy: WFL for tasks assessed/performed Overall Cognitive Status: Impaired/Different from baseline Area of Impairment: Safety/judgement                         Safety/Judgement: Decreased awareness of safety;Decreased awareness of deficits     General Comments: Pt is motivated to get better and home      Exercises  General Comments        Pertinent Vitals/Pain Pain Assessment: No/denies pain    Home Living                      Prior Function            PT Goals (current goals can now be found in the care plan section) Acute Rehab PT Goals Patient Stated Goal: to get to rehab and then home Potential to Achieve Goals: Good Progress towards PT goals: Progressing toward goals    Frequency    Min 3X/week      PT Plan Discharge plan needs to be updated    Co-evaluation              AM-PAC PT "6 Clicks" Mobility   Outcome Measure  Help needed turning from  your back to your side while in a flat bed without using bedrails?: A Little Help needed moving from lying on your back to sitting on the side of a flat bed without using bedrails?: A Little Help needed moving to and from a bed to a chair (including a wheelchair)?: A Little Help needed standing up from a chair using your arms (e.g., wheelchair or bedside chair)?: A Little Help needed to walk in hospital room?: A Lot Help needed climbing 3-5 steps with a railing? : Total 6 Click Score: 15    End of Session Equipment Utilized During Treatment: Gait belt Activity Tolerance: Patient tolerated treatment well;Patient limited by fatigue Patient left: in chair;with call bell/phone within reach;with chair alarm set Nurse Communication: Mobility status PT Visit Diagnosis: Pain;Difficulty in walking, not elsewhere classified (R26.2);Muscle weakness (generalized) (M62.81);Other abnormalities of gait and mobility (R26.89) Pain - Right/Left: Left Pain - part of body: Ankle and joints of foot     Time: [id number] PT Time Calculation (min) (ACUTE ONLY): 28 min  Charges:  $Gait Training: 8-22 mins $Therapeutic Activity: 8-22 mins                     Erasmo Leventhal , PTA Acute Rehabilitation Services Pager [phone] Office [phone]    [place] Eli Hose 08/10/2020, 11:31 AM

## 2020-08-10 NOTE — Progress Notes (Signed)
BP meds and lasix held secondary to soft BP. MD aware, ok to hold.

## 2020-08-10 NOTE — Progress Notes (Signed)
Patient has a discharge order. This RN reached out to the [facility] coordinator about a plan as the patient verbalized that he would be going to the CIR. Awaiting update.

## 2020-08-10 NOTE — Progress Notes (Signed)
Inpatient Rehab Admissions Coordinator:    I met with Pt. To discuss potential admit to CIR. He is interested and reports friend can provide support. Pt. Does appear to have an active Medicare policy.The BCBS policy listed in his chart is also active, per [facility] customer service. I will have to get insurance auth from [place] to bring him to CIR.  Clemens Catholic, [place], [place] Admissions Coordinator  [phone] ([place]) [phone] (office)

## 2020-08-11 ENCOUNTER — Ambulatory Visit: Payer: Medicare Other | Admitting: Podiatry

## 2020-08-11 ENCOUNTER — Encounter: Payer: Self-pay | Admitting: Internal Medicine

## 2020-08-11 ENCOUNTER — Other Ambulatory Visit: Payer: Self-pay

## 2020-08-11 ENCOUNTER — Inpatient Hospital Stay (HOSPITAL_COMMUNITY)
Admission: RE | Admit: 2020-08-11 | Discharge: 2020-08-14 | DRG: 945 | Disposition: A | Discharge status: Other Acute Inpatient Hospital | Payer: Medicare Other | Source: Intra-hospital | Attending: Physical Medicine and Rehabilitation | Admitting: Physical Medicine and Rehabilitation

## 2020-08-11 ENCOUNTER — Encounter (HOSPITAL_COMMUNITY): Payer: Self-pay | Admitting: Physical Medicine and Rehabilitation

## 2020-08-11 DIAGNOSIS — I272 Pulmonary hypertension, unspecified: Secondary | ICD-10-CM | POA: Diagnosis not present

## 2020-08-11 DIAGNOSIS — I96 Gangrene, not elsewhere classified: Secondary | ICD-10-CM | POA: Diagnosis not present

## 2020-08-11 DIAGNOSIS — E1152 Type 2 diabetes mellitus with diabetic peripheral angiopathy with gangrene: Secondary | ICD-10-CM | POA: Diagnosis present

## 2020-08-11 DIAGNOSIS — I70245 Atherosclerosis of native arteries of left leg with ulceration of other part of foot: Secondary | ICD-10-CM | POA: Diagnosis not present

## 2020-08-11 DIAGNOSIS — E119 Type 2 diabetes mellitus without complications: Secondary | ICD-10-CM | POA: Diagnosis not present

## 2020-08-11 DIAGNOSIS — I70235 Atherosclerosis of native arteries of right leg with ulceration of other part of foot: Secondary | ICD-10-CM | POA: Diagnosis not present

## 2020-08-11 DIAGNOSIS — I5022 Chronic systolic (congestive) heart failure: Secondary | ICD-10-CM | POA: Diagnosis present

## 2020-08-11 DIAGNOSIS — E1142 Type 2 diabetes mellitus with diabetic polyneuropathy: Secondary | ICD-10-CM | POA: Diagnosis present

## 2020-08-11 DIAGNOSIS — I739 Peripheral vascular disease, unspecified: Secondary | ICD-10-CM | POA: Diagnosis not present

## 2020-08-11 DIAGNOSIS — E785 Hyperlipidemia, unspecified: Secondary | ICD-10-CM | POA: Diagnosis present

## 2020-08-11 DIAGNOSIS — M86172 Other acute osteomyelitis, left ankle and foot: Secondary | ICD-10-CM | POA: Diagnosis not present

## 2020-08-11 DIAGNOSIS — Z8616 Personal history of COVID-19: Secondary | ICD-10-CM | POA: Diagnosis not present

## 2020-08-11 DIAGNOSIS — Z79899 Other long term (current) drug therapy: Secondary | ICD-10-CM | POA: Diagnosis not present

## 2020-08-11 DIAGNOSIS — Z91040 Latex allergy status: Secondary | ICD-10-CM

## 2020-08-11 DIAGNOSIS — I5021 Acute systolic (congestive) heart failure: Secondary | ICD-10-CM | POA: Diagnosis not present

## 2020-08-11 DIAGNOSIS — E669 Obesity, unspecified: Secondary | ICD-10-CM

## 2020-08-11 DIAGNOSIS — Z8572 Personal history of non-Hodgkin lymphomas: Secondary | ICD-10-CM | POA: Diagnosis not present

## 2020-08-11 DIAGNOSIS — M6281 Muscle weakness (generalized): Secondary | ICD-10-CM | POA: Diagnosis not present

## 2020-08-11 DIAGNOSIS — I4821 Permanent atrial fibrillation: Secondary | ICD-10-CM | POA: Diagnosis present

## 2020-08-11 DIAGNOSIS — Z89432 Acquired absence of left foot: Secondary | ICD-10-CM | POA: Diagnosis not present

## 2020-08-11 DIAGNOSIS — Z89431 Acquired absence of right foot: Secondary | ICD-10-CM

## 2020-08-11 DIAGNOSIS — Z6841 Body Mass Index (BMI) 40.0 and over, adult: Secondary | ICD-10-CM

## 2020-08-11 DIAGNOSIS — M109 Gout, unspecified: Secondary | ICD-10-CM | POA: Diagnosis present

## 2020-08-11 DIAGNOSIS — R5381 Other malaise: Principal | ICD-10-CM | POA: Diagnosis present

## 2020-08-11 DIAGNOSIS — E1169 Type 2 diabetes mellitus with other specified complication: Secondary | ICD-10-CM | POA: Diagnosis not present

## 2020-08-11 DIAGNOSIS — I110 Hypertensive heart disease with heart failure: Secondary | ICD-10-CM | POA: Diagnosis not present

## 2020-08-11 DIAGNOSIS — Z87891 Personal history of nicotine dependence: Secondary | ICD-10-CM | POA: Diagnosis not present

## 2020-08-11 DIAGNOSIS — E876 Hypokalemia: Secondary | ICD-10-CM | POA: Diagnosis present

## 2020-08-11 DIAGNOSIS — Z4781 Encounter for orthopedic aftercare following surgical amputation: Secondary | ICD-10-CM | POA: Diagnosis not present

## 2020-08-11 DIAGNOSIS — Z7901 Long term (current) use of anticoagulants: Secondary | ICD-10-CM | POA: Diagnosis not present

## 2020-08-11 DIAGNOSIS — E11621 Type 2 diabetes mellitus with foot ulcer: Secondary | ICD-10-CM | POA: Diagnosis present

## 2020-08-11 DIAGNOSIS — L97519 Non-pressure chronic ulcer of other part of right foot with unspecified severity: Secondary | ICD-10-CM

## 2020-08-11 DIAGNOSIS — I4891 Unspecified atrial fibrillation: Secondary | ICD-10-CM | POA: Diagnosis not present

## 2020-08-11 DIAGNOSIS — Z8249 Family history of ischemic heart disease and other diseases of the circulatory system: Secondary | ICD-10-CM

## 2020-08-11 DIAGNOSIS — Z823 Family history of stroke: Secondary | ICD-10-CM

## 2020-08-11 DIAGNOSIS — I10 Essential (primary) hypertension: Secondary | ICD-10-CM | POA: Diagnosis present

## 2020-08-11 DIAGNOSIS — Z8261 Family history of arthritis: Secondary | ICD-10-CM

## 2020-08-11 DIAGNOSIS — L89322 Pressure ulcer of left buttock, stage 2: Secondary | ICD-10-CM | POA: Diagnosis not present

## 2020-08-11 DIAGNOSIS — G4733 Obstructive sleep apnea (adult) (pediatric): Secondary | ICD-10-CM | POA: Diagnosis present

## 2020-08-11 LAB — GLUCOSE, CAPILLARY
Glucose-Capillary: 99 mg/dL (ref 70–99)
Glucose-Capillary: 113 mg/dL — ABNORMAL HIGH (ref 70–99)
Glucose-Capillary: 95 mg/dL (ref 70–99)
Glucose-Capillary: 140 mg/dL — ABNORMAL HIGH (ref 70–99)

## 2020-08-11 LAB — VANCOMYCIN, TROUGH: Vancomycin Tr: 22 ug/mL (ref 15–20)

## 2020-08-11 MED ORDER — TORSEMIDE 20 MG PO TABS: 20.0000 mg | ORAL_TABLET | Freq: Every day | ORAL | Status: DC

## 2020-08-11 MED ORDER — PANTOPRAZOLE SODIUM 40 MG PO TBEC
40.0000 mg | DELAYED_RELEASE_TABLET | Freq: Every day | ORAL | Status: DC
  Administered 2020-08-12 – 2020-08-14 (×3): 40 mg via ORAL
  Filled 2020-08-11 (×3): qty 1

## 2020-08-11 MED ORDER — PROCHLORPERAZINE MALEATE 5 MG PO TABS: 5.0000 mg | ORAL_TABLET | Freq: Four times a day (QID) | ORAL | Status: DC | PRN

## 2020-08-11 MED ORDER — LOSARTAN POTASSIUM 50 MG PO TABS
25.0000 mg | ORAL_TABLET | Freq: Two times a day (BID) | ORAL | Status: DC
  Administered 2020-08-11 – 2020-08-14 (×6): 25 mg via ORAL
  Filled 2020-08-11 (×6): qty 1

## 2020-08-11 MED ORDER — ZINC SULFATE 220 (50 ZN) MG PO CAPS
220.0000 mg | ORAL_CAPSULE | Freq: Every day | ORAL | Status: DC
  Administered 2020-08-12 – 2020-08-14 (×3): 220 mg via ORAL
  Filled 2020-08-11 (×3): qty 1

## 2020-08-11 MED ORDER — AMOXICILLIN-POT CLAVULANATE 875-125 MG PO TABS
1.0000 | ORAL_TABLET | Freq: Two times a day (BID) | ORAL | Status: DC
  Administered 2020-08-11 – 2020-08-14 (×6): 1 via ORAL
  Filled 2020-08-11 (×6): qty 1

## 2020-08-11 MED ORDER — ALUM & MAG HYDROXIDE-SIMETH 200-200-20 MG/5ML PO SUSP: 30.0000 mL | ORAL | Status: DC | PRN

## 2020-08-11 MED ORDER — ADULT MULTIVITAMIN W/MINERALS CH
1.0000 | ORAL_TABLET | Freq: Every day | ORAL | Status: DC
  Administered 2020-08-12 – 2020-08-14 (×3): 1 via ORAL
  Filled 2020-08-11 (×3): qty 1

## 2020-08-11 MED ORDER — OXYCODONE-ACETAMINOPHEN 5-325 MG PO TABS
1.0000 | ORAL_TABLET | ORAL | Status: DC | PRN
  Administered 2020-08-12: 2 via ORAL
  Filled 2020-08-11: qty 2

## 2020-08-11 MED ORDER — INSULIN ASPART 100 UNIT/ML IJ SOLN
0.0000 [IU] | Freq: Every day | INTRAMUSCULAR | Status: DC
Start: 2020-08-11 — End: 2020-08-12

## 2020-08-11 MED ORDER — POTASSIUM CHLORIDE CRYS ER 20 MEQ PO TBCR
20.0000 meq | EXTENDED_RELEASE_TABLET | Freq: Every day | ORAL | Status: DC
  Administered 2020-08-12 – 2020-08-14 (×3): 20 meq via ORAL
  Filled 2020-08-11 (×3): qty 1

## 2020-08-11 MED ORDER — ACETAMINOPHEN 325 MG PO TABS: 325.0000 mg | ORAL_TABLET | ORAL | Status: DC | PRN

## 2020-08-11 MED ORDER — INSULIN DETEMIR 100 UNIT/ML ~~LOC~~ SOLN
5.0000 [IU] | Freq: Every day | SUBCUTANEOUS | Status: DC
  Filled 2020-08-11: qty 0.05

## 2020-08-11 MED ORDER — ALLOPURINOL 300 MG PO TABS
300.0000 mg | ORAL_TABLET | Freq: Every day | ORAL | Status: DC
  Administered 2020-08-12 – 2020-08-14 (×3): 300 mg via ORAL
  Filled 2020-08-11 (×3): qty 1

## 2020-08-11 MED ORDER — ASCORBIC ACID 500 MG PO TABS
500.0000 mg | ORAL_TABLET | Freq: Two times a day (BID) | ORAL | Status: DC
  Administered 2020-08-11 – 2020-08-14 (×6): 500 mg via ORAL
  Filled 2020-08-11 (×6): qty 1

## 2020-08-11 MED ORDER — ATORVASTATIN CALCIUM 10 MG PO TABS
20.0000 mg | ORAL_TABLET | Freq: Every day | ORAL | Status: DC
  Administered 2020-08-12 – 2020-08-14 (×3): 20 mg via ORAL
  Filled 2020-08-11 (×3): qty 2

## 2020-08-11 MED ORDER — DOXYCYCLINE HYCLATE 100 MG PO TABS: 100.0000 mg | ORAL_TABLET | Freq: Two times a day (BID) | ORAL | Status: DC

## 2020-08-11 MED ORDER — JUVEN PO PACK
1.0000 | PACK | Freq: Two times a day (BID) | ORAL | Status: DC
  Filled 2020-08-11: qty 1

## 2020-08-11 MED ORDER — FLEET ENEMA 7-19 GM/118ML RE ENEM
1.0000 | ENEMA | Freq: Once | RECTAL | Status: DC | PRN
Start: 2020-08-11 — End: 2020-08-14

## 2020-08-11 MED ORDER — PROCHLORPERAZINE EDISYLATE 10 MG/2ML IJ SOLN: 5.0000 mg | Freq: Four times a day (QID) | INTRAMUSCULAR | Status: DC | PRN

## 2020-08-11 MED ORDER — TRAZODONE HCL 50 MG PO TABS: 25.0000 mg | ORAL_TABLET | Freq: Every evening | ORAL | Status: DC | PRN

## 2020-08-11 MED ORDER — POLYETHYLENE GLYCOL 3350 17 G PO PACK: 17.0000 g | PACK | Freq: Every day | ORAL | Status: DC | PRN

## 2020-08-11 MED ORDER — INSULIN ASPART 100 UNIT/ML IJ SOLN
0.0000 [IU] | Freq: Three times a day (TID) | INTRAMUSCULAR | Status: DC
Start: 2020-08-11 — End: 2020-08-12

## 2020-08-11 MED ORDER — DIPHENHYDRAMINE HCL 12.5 MG/5ML PO ELIX: 12.5000 mg | ORAL_SOLUTION | Freq: Four times a day (QID) | ORAL | Status: DC | PRN

## 2020-08-11 MED ORDER — DOXYCYCLINE HYCLATE 100 MG PO TABS
100.0000 mg | ORAL_TABLET | Freq: Two times a day (BID) | ORAL | Status: DC
  Administered 2020-08-11: 100 mg via ORAL
  Filled 2020-08-11: qty 1

## 2020-08-11 MED ORDER — AMOXICILLIN-POT CLAVULANATE 875-125 MG PO TABS: 1.0000 | ORAL_TABLET | Freq: Two times a day (BID) | ORAL | Status: DC

## 2020-08-11 MED ORDER — GUAIFENESIN-DM 100-10 MG/5ML PO SYRP: 5.0000 mL | ORAL_SOLUTION | Freq: Four times a day (QID) | ORAL | Status: DC | PRN

## 2020-08-11 MED ORDER — DOCUSATE SODIUM 100 MG PO CAPS
100.0000 mg | ORAL_CAPSULE | Freq: Two times a day (BID) | ORAL | Status: DC
  Administered 2020-08-11 – 2020-08-13 (×5): 100 mg via ORAL
  Filled 2020-08-11 (×6): qty 1

## 2020-08-11 MED ORDER — CARVEDILOL 12.5 MG PO TABS
12.5000 mg | ORAL_TABLET | Freq: Two times a day (BID) | ORAL | Status: DC
  Administered 2020-08-11 – 2020-08-14 (×6): 12.5 mg via ORAL
  Filled 2020-08-11 (×6): qty 1

## 2020-08-11 MED ORDER — TORSEMIDE 20 MG PO TABS
40.0000 mg | ORAL_TABLET | ORAL | Status: DC
  Administered 2020-08-12: 40 mg via ORAL
  Filled 2020-08-11 (×3): qty 2

## 2020-08-11 MED ORDER — TORSEMIDE 20 MG PO TABS
20.0000 mg | ORAL_TABLET | ORAL | Status: DC
  Administered 2020-08-13: 20 mg via ORAL
  Filled 2020-08-11 (×2): qty 1

## 2020-08-11 MED ORDER — PROCHLORPERAZINE 25 MG RE SUPP
12.5000 mg | Freq: Four times a day (QID) | RECTAL | Status: DC | PRN
Start: 2020-08-11 — End: 2020-08-14

## 2020-08-11 MED ORDER — DOXYCYCLINE HYCLATE 100 MG PO TABS
100.0000 mg | ORAL_TABLET | Freq: Two times a day (BID) | ORAL | Status: DC
  Administered 2020-08-11 – 2020-08-14 (×6): 100 mg via ORAL
  Filled 2020-08-11 (×6): qty 1

## 2020-08-11 MED ORDER — SERTRALINE HCL 50 MG PO TABS
50.0000 mg | ORAL_TABLET | Freq: Every day | ORAL | Status: DC
  Administered 2020-08-12 – 2020-08-14 (×3): 50 mg via ORAL
  Filled 2020-08-11 (×3): qty 1

## 2020-08-11 MED ORDER — AMOXICILLIN-POT CLAVULANATE 875-125 MG PO TABS
1.0000 | ORAL_TABLET | Freq: Two times a day (BID) | ORAL | Status: DC
  Administered 2020-08-11: 1 via ORAL
  Filled 2020-08-11: qty 1

## 2020-08-11 MED ORDER — APIXABAN 5 MG PO TABS
5.0000 mg | ORAL_TABLET | Freq: Two times a day (BID) | ORAL | Status: DC
  Administered 2020-08-11 – 2020-08-13 (×4): 5 mg via ORAL
  Filled 2020-08-11 (×4): qty 1

## 2020-08-11 MED ORDER — BISACODYL 10 MG RE SUPP: 10.0000 mg | Freq: Every day | RECTAL | Status: DC | PRN

## 2020-08-11 NOTE — Progress Notes (Signed)
Inpatient Rehab Admissions Coordinator:  ° °I have a bed for this Pt. On CIR today. RN may call report to [phone] after 12pm. ° °Cullin Dishman, MS, CCC-SLP °Rehab Admissions Coordinator  °[phone] (celll) °[phone] (office) ° °

## 2020-08-11 NOTE — PMR Pre-admission (Signed)
PMR Admission Coordinator Pre-Admission Assessment  Patient: [name] is an 65 y.o., male MRN: [id number] DOB: May 07, 1955 Height: $RemoveBefo'5\' 11"'[id number]$  (180.3 cm) Weight: (!) 155.6 kg  Insurance Information HMO:     PPO:      PCP:      IPA:      80/20:      OTHER:  PRIMARY: BCBS       Policy#: [id number]      Subscriber: Pt. [id number] CM Name: Maurilio Lovely      Phone#:      Fax#: [phone]  Received a fax from Maurilio Lovely at [facility] with approval for admission on 08/11/20 with clinical updates due on 01/12/56 Pre-Cert#: [id number]       Employer Self Fax: [phone]       Phone: [phone] Eff Date: 01/11/2020 - still active Deductible: $3,800 ($3,800 met) OOP Max: $8,700 ($5,714.81 met) CIR: 60% coverage, 40% co-insurance SNF: 60% coverage, 40% co-insurance; limited to 23 days/cal yr (55 remaining) Outpatient:  $50 co-pay/visit; limited to 30 visits combined PT/OT limit and 30/ST visit limit Home Health:  60% coverage, 40% co-insurance DME: 60% coverage, 40% co-insurance Providers: in network  SECONDARY: Medicare Part A and B       Policy#: [id number]       Financial Counselor:       Phone#:   The Therapist, art Information Summary" for patients in Inpatient Rehabilitation Facilities with attached "Privacy Act [place] Records" was provided and verbally reviewed with: N/A  Emergency Contact Information Contact Information     Name Relation Home Work [place] [phone]  [phone]   Margaretmary Eddy Sister  [phone]       Current Medical History  Patient Admitting Diagnosis: L Transmetatarsal Amputation  History of Present Illness: [name] is a 65 y.o. male with medical history significant of follicular lymphoma; morbid obesity; chronic systolic CHF; afib; DM; HLD; and HTN presenting with L foot infection.  Last year, he had his L great toe removed - he wore water shoes in [place] and it got infected on the  bottom.  After it healed, he started wearing shoes again and the other 2 toes took the brunt of the weight and got infected.  He took antibiotics only once for this. They have been infected and he was seen yesterday and was told to come in for IV antibiotics and likely further amputation.  Pt. Presented to [facility] 08[facility]04[facility]20 and underwent L transmetartarsal amputation with podiatry on 08/06/20. CIR was consulted to assist retrurn to PLOF.     Patient's medical record from [facility] has been reviewed by the rehabilitation admission coordinator and physician.  Past Medical History  Past Medical History:  Diagnosis Date   Acute systolic HF (heart failure) (HCC)    Arthritis    lt foot, hips knees    Atrial fibrillation (HCC)    Diabetes mellitus, new onset ([place])    Diverticulosis    Dyslipidemia    Gout attack 01/16/2012   Hypertension    Internal hemorrhoids    Lymphoma ([place])    remission for about 2 years, chemo 3 years prior   Pneumonia due to COVID-19 virus 07/2018   Pulmonary hypertension ([place])    Sepsis ([place]) 02/2017   secondary to influenza; requiring trach   Tubular adenoma of colon    Venous stasis ulcers ([place])     Family History   family history includes Arthritis in his mother;  CVA (age of onset: 25) in his sister; Dementia in his mother; Dementia (age of onset: 51) in his brother; Hypertension in his mother.  Prior Rehab/Hospitalizations Has the patient had prior rehab or hospitalizations prior to admission? No  Has the patient had major surgery during 100 days prior to admission? Yes   Current Medications  Current Facility-Administered Medications:    acetaminophen (TYLENOL) tablet 650 mg, 650 mg, Oral, Q6H PRN, 650 mg at 08/07/20 1517 **OR** acetaminophen (TYLENOL) suppository 650 mg, 650 mg, Rectal, Q6H PRN, Karmen Bongo, MD   allopurinol (ZYLOPRIM) tablet 300 mg, 300 mg, Oral, Daily, Karmen Bongo, MD, 300 mg at 08/11/20 1019    amoxicillin-clavulanate (AUGMENTIN) 875-125 MG per tablet 1 tablet, 1 tablet, Oral, Q12H, Charlynne Cousins, MD, 1 tablet at 08/11/20 1025   apixaban (ELIQUIS) tablet 5 mg, 5 mg, Oral, BID, Charlynne Cousins, MD, 5 mg at 08/11/20 1020   atorvastatin (LIPITOR) tablet 20 mg, 20 mg, Oral, Daily, Karmen Bongo, MD, 20 mg at 08/11/20 1019   bisacodyl (DULCOLAX) EC tablet 5 mg, 5 mg, Oral, Daily PRN, Karmen Bongo, MD   carvedilol (COREG) tablet 12.5 mg, 12.5 mg, Oral, BID, Karmen Bongo, MD, 12.5 mg at 08/11/20 1020   docusate sodium (COLACE) capsule 100 mg, 100 mg, Oral, BID, Karmen Bongo, MD, 100 mg at 08/07/20 1022   doxycycline (VIBRA-TABS) tablet 100 mg, 100 mg, Oral, Q12H, Charlynne Cousins, MD, 100 mg at 08/11/20 1025   hydrALAZINE (APRESOLINE) injection 5 mg, 5 mg, Intravenous, Q4H PRN, Karmen Bongo, MD   insulin aspart (novoLOG) injection 0-15 Units, 0-15 Units, Subcutaneous, TID WC, Karmen Bongo, MD, 2 Units at 08/08/20 1710   insulin aspart (novoLOG) injection 0-5 Units, 0-5 Units, Subcutaneous, QHS, Karmen Bongo, MD   insulin detemir (LEVEMIR) injection 5 Units, 5 Units, Subcutaneous, Daily, Charlynne Cousins, MD, 5 Units at 08/11/20 1020   losartan (COZAAR) tablet 25 mg, 25 mg, Oral, BID, Charlynne Cousins, MD, 25 mg at 08/11/20 1025   multivitamin with minerals tablet 1 tablet, 1 tablet, Oral, Daily, Charlynne Cousins, MD, 1 tablet at 08/11/20 1019   nutrition supplement (JUVEN) (JUVEN) powder packet 1 packet, 1 packet, Oral, BID BM, Charlynne Cousins, MD, 1 packet at 08/10/20 1358   ondansetron (ZOFRAN) tablet 4 mg, 4 mg, Oral, Q6H PRN **OR** ondansetron (ZOFRAN) injection 4 mg, 4 mg, Intravenous, Q6H PRN, Karmen Bongo, MD   oxyCODONE-acetaminophen (PERCOCET/ROXICET) 5-325 MG per tablet 1-2 tablet, 1-2 tablet, Oral, Q4H PRN, Charlynne Cousins, MD, 2 tablet at 08/11/20 1019   pantoprazole (PROTONIX) EC tablet 40 mg, 40 mg, Oral, Daily, Karmen Bongo, MD, 40 mg at 08/11/20 1019   polyethylene glycol (MIRALAX / GLYCOLAX) packet 17 g, 17 g, Oral, Daily PRN, Karmen Bongo, MD   potassium chloride SA (KLOR-CON) CR tablet 20 mEq, 20 mEq, Oral, Daily, Charlynne Cousins, MD, 20 mEq at 08/11/20 1019   sertraline (ZOLOFT) tablet 50 mg, 50 mg, Oral, Daily, Karmen Bongo, MD, 50 mg at 08/11/20 1019   torsemide (DEMADEX) tablet 20 mg, 20 mg, Oral, Daily, Charlynne Cousins, MD, 20 mg at 08/11/20 1020  Facility-Administered Medications Ordered in Other Encounters:    sodium chloride 0.9 % injection 10 mL, 10 mL, Intravenous, PRN, Curt Bears, MD, 10 mL at 05/04/15 1529  Patients Current Diet:  Diet Order             Diet - low sodium heart healthy  Diet heart healthy/carb modified Room service appropriate? Yes; Fluid consistency: Thin  Diet effective now                   Precautions / Restrictions Precautions Precautions: Fall Precaution Comments: venous stasis Other Brace: L darco shoe Restrictions Weight Bearing Restrictions: Yes LLE Weight Bearing: Non weight bearing Other Position/Activity Restrictions: Pt has difficulty maintaining weight bearing in standing and fatigues quickly.   Has the patient had 2 or more falls or a fall with injury in the past year? No  Prior Activity Level Community (5-7x/wk): PT. working and active in the community PTA  Prior Functional Level Self Care: Did the patient need help bathing, dressing, using the toilet or eating? Independent  Indoor Mobility: Did the patient need assistance with walking from room to room (with or without device)? Independent  Stairs: Did the patient need assistance with internal or external stairs (with or without device)? Independent  Functional Cognition: Did the patient need help planning regular tasks such as shopping or remembering to take medications? Independent  Home Assistive Devices / Equipment Home Equipment: Walker - 2  wheels, Cane - single point  Prior Device Use: Indicate devices/aids used by the patient prior to current illness, exacerbation or injury? None of the above  Current Functional Level Cognition  Overall Cognitive Status: Impaired/Different from baseline Orientation Level: Oriented X4 Safety/Judgement: Decreased awareness of safety, Decreased awareness of deficits General Comments: Pt is motivated to get better and home    Extremity Assessment (includes Sensation/Coordination)  Upper Extremity Assessment: Overall WFL for tasks assessed  Lower Extremity Assessment: Defer to PT evaluation LLE Deficits / Details: LLE NWB and hip/knee are weaker than RLE LLE Coordination: decreased gross motor    ADLs  Overall ADL's : Needs assistance/impaired Eating/Feeding: Set up, Sitting Grooming: Set up, Sitting Upper Body Bathing: Set up, Sitting Lower Body Bathing: Maximal assistance, Sit to/from stand, +2 for safety/equipment Upper Body Dressing : Set up, Sitting Lower Body Dressing: Maximal assistance, +2 for physical assistance, +2 for safety/equipment, Sit to/from stand Toilet Transfer: Maximal assistance, +2 for safety/equipment, +2 for physical assistance, BSC, Stand-pivot, RW Toilet Transfer Details (indicate cue type and reason): +1 assist for holding foot of of ground to maintain WB status Toileting- Clothing Manipulation and Hygiene: Maximal assistance, +2 for physical assistance, +2 for safety/equipment, Sit to/from stand Functional mobility during ADLs: Maximal assistance, +2 for physical assistance, +2 for safety/equipment, Rolling walker, Cueing for safety General ADL Comments: Pt  encouraged to complete a/p transfers for [facility] and chair transfers to maintain WB status    Mobility  Overal bed mobility: Needs Assistance Bed Mobility: Sit to Supine Supine to sit: Supervision Sit to supine: Min assist General bed mobility comments: supervision for safety    Transfers  Overall  transfer level: Needs assistance Equipment used: Rolling walker (2 wheeled), 2 person hand held assist Transfers: Sit to/from Stand, Stand Pivot Transfers Sit to Stand: Max assist, +2 physical assistance, +2 safety/equipment Stand pivot transfers: Max assist, +2 physical assistance, +2 safety/equipment General transfer comment: cues for hand placement, body positioning and WB status - pt did not adhere to WB status well during sit<>stand ttransition and when pivoting R foot - encouraged a/p transfers or steady transfers with +2 A    Ambulation / Gait / Stairs / Wheelchair Mobility  Ambulation/Gait Ambulation/Gait assistance: Mod assist, +2 safety/equipment Gait Distance (Feet): 4 Feet (x2 trials.  Pt had difficulty with foot clearance after 4 ft of gt training and  required seated surface to be moved to his backside to prepare for sitting.) Assistive device: Rolling walker (2 wheeled) Gait Pattern/deviations: Step-to pattern, Trunk flexed General Gait Details: Decreased foot clearance on R and decreased strength to maintain weight bearing on L LE.  Performed limited short bouts of gt with step to ( hop to pattern).  Pt able to pivot without hoping to move from bed to recliner.    Posture / Balance Balance Overall balance assessment: Needs assistance Sitting-balance support: Single extremity supported Sitting balance-Leahy Scale: Good Standing balance support: Bilateral upper extremity supported Standing balance-Leahy Scale: Poor    Special needs/care consideration Skin Surgical incision  and Diabetic management DM2 managed with Novolog and Levemir ,  Orthotics: In L Darco shoe   Previous Home Environment (from acute therapy documentation) Living Arrangements: Alone Available Help at Discharge: Friend(s), Available PRN/intermittently Type of Home: House Home Layout: One level Bathroom Shower/Tub: Tub/shower unit, Multimedia programmer: Standard Additional Comments: awaiting cam  boot  Discharge Living Setting Plans for Discharge Living Setting: Patient's home Type of Home at Discharge: House Discharge Home Layout: One level Discharge Home Access: Stairs to enter Discharge Bathroom Shower/Tub: Tub/shower unit Discharge Bathroom Toilet: Standard Discharge Bathroom Accessibility: Yes How Accessible: Accessible via walker  Social/Family/Support Systems Patient Roles: Other (Comment) Contact Information: [phone] Anticipated Caregiver: Mariea Stable Anticipated Caregiver's Contact Information: [phone] Ability/Limitations of Caregiver: can provide min A Caregiver Availability: 24/7 Discharge Plan Discussed with Primary Caregiver: Yes Is Caregiver In Agreement with Plan?: Yes  Goals Patient/Family Goal for Rehab: PT/OT Supervision Expected length of stay: 12-14 days Pt/Family Agrees to Admission and willing to participate: Yes Program Orientation Provided & Reviewed with Pt/Caregiver Including Roles  & Responsibilities: Yes  Decrease burden of Care through IP rehab admission: Specialzed equipment needs, Decrease number of caregivers, Bowel and bladder program, and Patient/family education  Possible need for SNF placement upon discharge: not anticipated   Patient Condition: I have reviewed medical records from [facility] , spoken with CM, and patient. I met with patient at the bedside for inpatient rehabilitation assessment.  Patient will benefit from ongoing PT and OT, can actively participate in 3 hours of therapy a day 5 days of the week, and can make measurable gains during the admission.  Patient will also benefit from the coordinated team approach during an Inpatient Acute Rehabilitation admission.  The patient will receive intensive therapy as well as Rehabilitation physician, nursing, social worker, and care management interventions.  Due to safety, skin/wound care, disease management, medication administration, pain management,  and patient education the patient requires 24 hour a day rehabilitation nursing.  The patient is currently min-Max A with mobility and basic ADLs.  Discharge setting and therapy post discharge at home with home health is anticipated.  Patient has agreed to participate in the Acute Inpatient Rehabilitation Program and will admit today.  Preadmission Screen Completed By:  Genella Mech, 08/11/2020 10:25 AM ______________________________________________________________________   Discussed status with Dr. Naaman Plummer on 08/11/20 at 79 and received approval for admission today.  Admission Coordinator:  Genella Mech, CCC-SLP, time 1038/Date 08/11/20   Assessment/Plan: Diagnosis: left TMA Does the need for close, 24 hr/day Medical supervision in concert with the patient's rehab needs make it unreasonable for this patient to be served in a less intensive setting? Yes Co-Morbidities requiring supervision/potential complications: dm, gout, morbid obesity, chf Due to bladder management, bowel management, safety, skin/wound care, disease management, medication administration, pain management, and patient education, does the  patient require 24 hr/day rehab nursing? Yes Does the patient require coordinated care of a physician, rehab nurse, PT, OT  to address physical and functional deficits in the context of the above medical diagnosis(es)? Yes Addressing deficits in the following areas: balance, endurance, locomotion, strength, transferring, bowel/bladder control, bathing, dressing, feeding, grooming, toileting, and psychosocial support Can the patient actively participate in an intensive therapy program of at least 3 hrs of therapy 5 days a week? Yes The potential for patient to make measurable gains while on inpatient rehab is excellent Anticipated functional outcomes upon discharge from inpatient rehab: supervision PT, supervision OT, n/a SLP Estimated rehab length of stay to reach the above functional goals is:  12-14 days Anticipated discharge destination: Home 10. Overall Rehab/Functional Prognosis: excellent   MD Signature: Meredith Staggers, MD, [facility] 08/11/2020

## 2020-08-11 NOTE — Progress Notes (Signed)
Inpatient Rehabilitation  Patient information reviewed and entered into eRehab system by Ariyana Faw M. Leandre Wien, M.A., CCC/SLP, PPS Coordinator.  Information including medical coding, functional ability and quality indicators will be reviewed and updated through discharge.    

## 2020-08-11 NOTE — H&P (Signed)
Physical Medicine and Rehabilitation Admission H&P    Chief Complaint  Patient presents with   Debility    HPI: Chesley D. Arko is a 65 year old male with history of follicular lymphoma, morbid obesity-BMI 61, A fib, recent ED visits for CHF exacerbation, left great toe amputation who was admitted on 08/04/20 with acute osteomyelitis of left 2nd and 3rd toe and gangrenous changes of left 2nd toe. He was started on IV vanc and underwent left transmetatarsal ampuation by Dr. Jacqualyn Posey on 07/26. Post op to be NWB with CAM boot and IV antibiotids transitioned to Augmentin/doxycycline for 2 weeks. PT/OT consulted and patient  noted to have difficulty maintaining NWB with standing, fatigue and limited mobility. CIR recommended due to functional decline.  Review of Systems  Constitutional:  Negative for chills and fever.  HENT:  Negative for congestion.   Eyes:  Negative for blurred vision.  Respiratory:  Positive for cough and shortness of breath.   Cardiovascular:  Positive for orthopnea and leg swelling. Negative for chest pain.  Gastrointestinal:  Negative for nausea and vomiting.  Genitourinary:  Negative for dysuria and urgency.  Musculoskeletal:  Positive for joint pain.  Skin:  Negative for rash.  Neurological:  Positive for sensory change and weakness.  Psychiatric/Behavioral:  Negative for depression and suicidal ideas.     Past Medical History:  Diagnosis Date   Acute systolic HF (heart failure) (HCC)    Arthritis    lt foot, hips knees    Atrial fibrillation ([place])    Diabetes mellitus, new onset ([place])    Diverticulosis    Dyslipidemia    Gout attack 01/16/2012   Hypertension    Internal hemorrhoids    Lymphoma ([place])    remission for about 2 years, chemo 3 years prior   Pneumonia due to COVID-19 virus 07/2018   Pulmonary hypertension ([place])    Sepsis ([place]) 02/2017   secondary to influenza; requiring trach   Tubular adenoma of colon    Venous stasis ulcers ([place])      Past Surgical History:  Procedure Laterality Date   AMPUTATION TOE Left 05/08/2019   Procedure: AMPUTATION LEFT GREAT  TOE;  Surgeon: Trula Slade, DPM;  Location: [facility];  Service: Podiatry;  Laterality: Left;   BRONCHOSCOPY     ESOPHAGOGASTRODUODENOSCOPY ENDOSCOPY     Dr Royden Purl HERNIA REPAIR Left    2001   IR REMOVAL TUN ACCESS W/ PORT W/O FL MOD SED  02/03/2020   PORTA CATH INSERTION  2011   TRACHEOSTOMY  03/2017   with decannulation   TRANSMETATARSAL AMPUTATION Left 08/05/2020   Procedure: TRANSMETATARSAL AMPUTATION;  Surgeon: Trula Slade, DPM;  Location: [facility];  Service: Podiatry;  Laterality: Left;    Family History  Problem Relation Age of Onset   Dementia Brother 61       frontal lobe    Dementia Mother    Arthritis Mother    Hypertension Mother    CVA Sister 16   Colon cancer Neg Hx    Pancreatic cancer Neg Hx    Stomach cancer Neg Hx    Rectal cancer Neg Hx    Liver cancer Neg Hx     Social History:  reports that he quit smoking about 11 years ago. His smoking use included cigarettes. He has a 10.00 pack-year smoking history. He has never used smokeless tobacco. He reports previous alcohol use. He reports that he does not use drugs.  Allergies  Allergen Reactions   Latex Hives and Swelling    Medications Prior to Admission  Medication Sig Dispense Refill   acetaminophen (TYLENOL) 325 MG tablet Take 650 mg by mouth every 6 (six) hours as needed for moderate pain.      albuterol (PROVENTIL HFA;VENTOLIN HFA) 108 (90 Base) MCG/ACT inhaler Inhale 2 puffs into the lungs every 6 (six) hours as needed for wheezing or shortness of breath. 1 Inhaler 2   allopurinol (ZYLOPRIM) 300 MG tablet Take 300 mg by mouth daily.     apixaban (ELIQUIS) 5 MG TABS tablet TAKE 1 TABLET BY MOUTH TWICE A DAY 180 tablet 1   atorvastatin (LIPITOR) 20 MG tablet TAKE 1 TABLET BY MOUTH EVERY DAY *PT OVERDUE FOR OFFICE VISIT* 30 tablet 5   carvedilol (COREG) 12.5 MG  tablet Take 1 tablet (12.5 mg total) by mouth 2 (two) times daily. 180 tablet 3   cholecalciferol (VITAMIN D) 25 MCG (1000 UNIT) tablet Take 1,000 Units by mouth daily.     collagenase (SANTYL) ointment Apply 1 application topically daily. Measurements to right hallux 2  X 1 x 0.1cm (Patient taking differently: Apply 1 application topically daily. Measurements to right hallux 2  X 1 x 0.1cm) 30 g 5   [employer] DIGESTIVE PROBIOTIC 250 MG capsule Take 500 mg by mouth daily.     KLOR-CON M20 20 MEQ tablet TAKE 1 TABLET BY MOUTH EVERY DAY 90 tablet 1   losartan (COZAAR) 50 MG tablet TAKE 1 TABLET BY MOUTH EVERY MORNING AND AT BEDTIME 180 tablet 2   Multiple Vitamin (MULTIVITAMIN WITH MINERALS) TABS tablet Take 1 tablet by mouth daily.     Omega 3 340 MG CPDR Take 1 tablet by mouth 2 (two) times daily.     omeprazole (PRILOSEC) 40 MG capsule Take 40 mg by mouth daily.      sertraline (ZOLOFT) 50 MG tablet Take 1 tablet (50 mg total) by mouth daily. 30 tablet 0   torsemide (DEMADEX) 20 MG tablet Take 2 tablets by mouth daily alternating with 1 tablet by mouth daily. 90 tablet 1   vitamin B-12 (CYANOCOBALAMIN) 1000 MCG tablet Take 1,000 mcg by mouth daily.     vitamin C (VITAMIN C) 500 MG tablet Take 1 tablet (500 mg total) by mouth 3 (three) times daily.      Drug Regimen Review  Drug regimen was reviewed and remains appropriate with no significant issues identified  Home: Home Living Family/patient expects to be discharged to:: Private residence Living Arrangements: Alone Available Help at Discharge: Friend(s), Available PRN/intermittently Type of Home: [place]: One level Bathroom Shower/Tub: Tub/shower unit, Multimedia programmer: Standard Home Equipment: Environmental consultant - 2 wheels, [employer] - single point Additional Comments: awaiting cam boot   Functional History: Prior Function Level of Independence: Independent with assistive device(s) 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SPC, working as  Theme park manager  Functional Status:  Mobility: Bed Mobility Overal bed mobility: Needs Assistance Bed Mobility: Sit to Supine Supine to sit: Supervision Sit to supine: Min assist General bed mobility comments: supervision for safety Transfers Overall transfer level: Needs assistance Equipment used: Rolling walker (2 wheeled), 2 person hand held assist Transfers: Sit to/from Stand, Stand Pivot Transfers Sit to Stand: Max assist, +2 physical assistance, +2 safety/equipment Stand pivot transfers: Max assist, +2 physical assistance, +2 safety/equipment General transfer comment: cues for hand placement, body positioning and WB status - pt did not adhere to WB status well during sit<>stand ttransition and when pivoting R foot -  encouraged a/p transfers or steady transfers with +2 A Ambulation/Gait Ambulation/Gait assistance: Mod assist, +2 safety/equipment Gait Distance (Feet): 4 Feet (x2 trials.  Pt had difficulty with foot clearance after 4 ft of gt training and required seated surface to be moved to his backside to prepare for sitting.) Assistive device: Rolling walker (2 wheeled) Gait Pattern/deviations: Step-to pattern, Trunk flexed General Gait Details: Decreased foot clearance on R and decreased strength to maintain weight bearing on L LE.  Performed limited short bouts of gt with step to ( hop to pattern).  Pt able to pivot without hoping to move from bed to recliner.    ADL: ADL Overall ADL's : Needs assistance/impaired Eating/Feeding: Set up, Sitting Grooming: Set up, Sitting Upper Body Bathing: Set up, Sitting Lower Body Bathing: Maximal assistance, Sit to/from stand, +2 for safety/equipment Upper Body Dressing : Set up, Sitting Lower Body Dressing: Maximal assistance, +2 for physical assistance, +2 for safety/equipment, Sit to/from stand Toilet Transfer: Maximal assistance, +2 for safety/equipment, +2 for physical assistance, BSC, Stand-pivot, RW Toilet Transfer Details (indicate  cue type and reason): +1 assist for holding foot of of ground to maintain WB status Toileting- Clothing Manipulation and Hygiene: Maximal assistance, +2 for physical assistance, +2 for safety/equipment, Sit to/from stand Functional mobility during ADLs: Maximal assistance, +2 for physical assistance, +2 for safety/equipment, Rolling walker, Cueing for safety General ADL Comments: Pt  encouraged to complete a/p transfers for [facility] and chair transfers to maintain WB status  Cognition: Cognition Overall Cognitive Status: Impaired/Different from baseline Orientation Level: Oriented X4 Cognition Arousal/Alertness: Awake/alert Behavior During Therapy: WFL for tasks assessed/performed Overall Cognitive Status: Impaired/Different from baseline Area of Impairment: Safety/judgement Safety/Judgement: Decreased awareness of safety, Decreased awareness of deficits General Comments: Pt is motivated to get better and home   Blood pressure (!) 145/75, pulse 63, temperature 98.2 F (36.8 C), temperature source Oral, resp. rate 20, height '5\' 11"'$  (1.803 m), weight (!) 155.6 kg, SpO2 98 %. Physical Exam Constitutional:      Appearance: He is obese. He is not ill-appearing.  HENT:     Head: Normocephalic and atraumatic.     Right Ear: External ear normal.     Left Ear: External ear normal.     Nose: Nose normal.     Mouth/Throat:     Mouth: Mucous membranes are moist.     Pharynx: Oropharynx is clear.  Eyes:     Extraocular Movements: Extraocular movements intact.     Conjunctiva/sclera: Conjunctivae normal.     Pupils: Pupils are equal, round, and reactive to light.  Cardiovascular:     Rate and Rhythm: Normal rate and regular rhythm.     Heart sounds: No murmur heard. Pulmonary:     Effort: Pulmonary effort is normal. No respiratory distress.     Breath sounds: No wheezing.  Abdominal:     General: Bowel sounds are normal. There is no distension.     Palpations: Abdomen is soft.      Tenderness: There is no abdominal tenderness.  Genitourinary:    Rectum: Guaiac stool: urine clear, yellow.  Musculoskeletal:     Cervical back: Normal range of motion.     Right lower leg: Edema present.     Left lower leg: Edema present.  Skin:    Comments: Chronic vascular changes in bilateral legs. Calcified plaques on the dorsum of right foot, toes as well as right ankle. Left TMA dressed  Neurological:     Comments: Alert and oriented x 3.  Normal insight and awareness. Intact Memory. Normal language and speech. Cranial nerve exam unremarkable. UE 5/5. Able to lift left leg off bed and move ankle. RLE 3-4/5 prox to distal. Stocking glove sensory loss below the ankles bilaterally. DTR's 1+  Psychiatric:        Mood and Affect: Mood normal.        Behavior: Behavior normal.        Thought Content: Thought content normal.    Results for orders placed or performed during the hospital encounter of 08/03/20 (from the past 48 hour(s))  Glucose, capillary     Status: Abnormal   Collection Time: 08/09/20  5:25 PM  Result Value Ref Range   Glucose-Capillary 112 (H) 70 - 99 mg/dL    Comment: Glucose reference range applies only to samples taken after fasting for at least 8 hours.  Glucose, capillary     Status: Abnormal   Collection Time: 08/09/20  8:43 PM  Result Value Ref Range   Glucose-Capillary 123 (H) 70 - 99 mg/dL    Comment: Glucose reference range applies only to samples taken after fasting for at least 8 hours.  Glucose, capillary     Status: None   Collection Time: 08/10/20  7:29 AM  Result Value Ref Range   Glucose-Capillary 91 70 - 99 mg/dL    Comment: Glucose reference range applies only to samples taken after fasting for at least 8 hours.  Glucose, capillary     Status: Abnormal   Collection Time: 08/10/20 12:28 PM  Result Value Ref Range   Glucose-Capillary 112 (H) 70 - 99 mg/dL    Comment: Glucose reference range applies only to samples taken after fasting for at least  8 hours.  Glucose, capillary     Status: None   Collection Time: 08/10/20  5:16 PM  Result Value Ref Range   Glucose-Capillary 99 70 - 99 mg/dL    Comment: Glucose reference range applies only to samples taken after fasting for at least 8 hours.  Glucose, capillary     Status: Abnormal   Collection Time: 08/10/20 10:00 PM  Result Value Ref Range   Glucose-Capillary 121 (H) 70 - 99 mg/dL    Comment: Glucose reference range applies only to samples taken after fasting for at least 8 hours.  Glucose, capillary     Status: None   Collection Time: 08/11/20  8:54 AM  Result Value Ref Range   Glucose-Capillary 99 70 - 99 mg/dL    Comment: Glucose reference range applies only to samples taken after fasting for at least 8 hours.  Vancomycin, trough     Status: Abnormal   Collection Time: 08/11/20  9:52 AM  Result Value Ref Range   Vancomycin Tr 22 (HH) 15 - 20 ug/mL    Comment: CRITICAL RESULT CALLED TO, READ BACK BY AND VERIFIED WITH: Vivia Birmingham RN [id number] [phone] BY A BENNETT Performed at [facility], [place] [address] [place], [place] [address]   Glucose, capillary     Status: Abnormal   Collection Time: 08/11/20 12:01 PM  Result Value Ref Range   Glucose-Capillary 113 (H) 70 - 99 mg/dL    Comment: Glucose reference range applies only to samples taken after fasting for at least 8 hours.   No results found.     Medical Problem List and Plan: 1.  Functional and mobility deficits secondary to gangrene/osteomyelitis of left foot which required a TMA  -patient may shower if left foot is covered  -  ELOS/Goals: 12-14 days, supervision goals with PT and OT  -pt is NWB with CAM Boot LLE 2.  Antithrombotics: -DVT/anticoagulation:  Pharmaceutical: Other (comment) Eliquis  -antiplatelet therapy: N/A 3. Pain Management: Oxycodone prn 4. Mood: LCSW to follow for evaluation and support.   -antipsychotic agents: N/A 5. Neuropsych: This patient is capable of making decisions on his own  behalf. 6. Skin/Wound Care: Routine pressure relief  -redress wound tomorrow 7. Fluids/Electrolytes/Nutrition: Monitor I/O. Check CMET in am. 8. T2DM: Hgb A1c-6.4.  Continue Levemir with sliding scale insulin.  -- Monitor blood sugars ac/HS.  Use sliding scale insulin for tighter control. 9. Left foot infection: TO continue doxycycline and Augmentin-->end date 08/14.   --add vitamin C and Zinc. Continue Juven to promote wound healing.   10. Chronic systolic CHF: Monitor for signs of overload.  Check weights daily.   --Continue Cozaar, coreg, Lipitor and Demadex.  11. CAF: Monitor HR TID--on Eliquis and Coreg for rate control.         Bary Leriche, PA-C 08/11/2020

## 2020-08-11 NOTE — Progress Notes (Signed)
Called and gave report to [facility] on 4W. She will be giving this nurse a call when the bed is ready.

## 2020-08-11 NOTE — H&P (Signed)
Physical Medicine and Rehabilitation Admission H&P        Chief Complaint  Patient presents with   Debility      HPI: [name] is a 65 year old male with history of follicular lymphoma, morbid obesity-BMI 31, A fib, recent ED visits for CHF exacerbation, left great toe amputation who was admitted on 08/04/20 with acute osteomyelitis of left 2nd and 3rd toe and gangrenous changes of left 2nd toe. He was started on IV vanc and underwent left transmetatarsal ampuation by Dr. Jacqualyn Posey on 07/26. Post op to be NWB with CAM boot and IV antibiotids transitioned to Augmentin/doxycycline for 2 weeks. PT/OT consulted and patient  noted to have difficulty maintaining NWB with standing, fatigue and limited mobility. CIR recommended due to functional decline.  Review of Systems Constitutional:  Negative for chills and fever. HENT:  Negative for congestion.   Eyes:  Negative for blurred vision. Respiratory:  Positive for cough and shortness of breath.   Cardiovascular:  Positive for orthopnea and leg swelling. Negative for chest pain. Gastrointestinal:  Negative for nausea and vomiting. Genitourinary:  Negative for dysuria and urgency. Musculoskeletal:  Positive for joint pain. Skin:  Negative for rash. Neurological:  Positive for sensory change and weakness. Psychiatric/Behavioral:  Negative for depression and suicidal ideas.           Past Medical History:  Diagnosis Date   Acute systolic HF (heart failure) (HCC)     Arthritis      lt foot, hips knees   Atrial fibrillation ([place])     Diabetes mellitus, new onset ([place])     Diverticulosis     Dyslipidemia     Gout attack 01/16/2012   Hypertension     Internal hemorrhoids     Lymphoma ([place])      remission for about 2 years, chemo 3 years prior   Pneumonia due to COVID-19 virus 07/2018   Pulmonary hypertension ([place])     Sepsis ([place]) 02/2017    secondary to influenza; requiring trach   Tubular adenoma of colon     Venous stasis  ulcers ([place])             Past Surgical History:  Procedure Laterality Date   AMPUTATION TOE Left 05/08/2019    Procedure: AMPUTATION LEFT GREAT  TOE;  Surgeon: Trula Slade, DPM;  Location: [facility];  Service: Podiatry;  Laterality: Left;   BRONCHOSCOPY       ESOPHAGOGASTRODUODENOSCOPY ENDOSCOPY        Dr Royden Purl HERNIA REPAIR Left      2001   IR REMOVAL TUN ACCESS W/ PORT W/O FL MOD SED   02/03/2020   PORTA CATH INSERTION   2011   TRACHEOSTOMY   03/2017    with decannulation   TRANSMETATARSAL AMPUTATION Left 08/05/2020    Procedure: TRANSMETATARSAL AMPUTATION;  Surgeon: Trula Slade, DPM;  Location: [facility];  Service: Podiatry;  Laterality: Left;           Family History  Problem Relation Age of Onset   Dementia Brother 105        frontal lobe   Dementia Mother     Arthritis Mother     Hypertension Mother     CVA Sister 6   Colon cancer Neg Hx     Pancreatic cancer Neg Hx     Stomach cancer Neg Hx     Rectal cancer Neg Hx  Liver cancer Neg Hx        Social History:  reports that he quit smoking about 11 years ago. His smoking use included cigarettes. He has a 10.00 pack-year smoking history. He has never used smokeless tobacco. He reports previous alcohol use. He reports that he does not use drugs.         Allergies  Allergen Reactions   Latex Hives and Swelling            Medications Prior to Admission  Medication Sig Dispense Refill   acetaminophen (TYLENOL) 325 MG tablet Take 650 mg by mouth every 6 (six) hours as needed for moderate pain.       albuterol (PROVENTIL HFA;VENTOLIN HFA) 108 (90 Base) MCG/ACT inhaler Inhale 2 puffs into the lungs every 6 (six) hours as needed for wheezing or shortness of breath. 1 Inhaler 2   allopurinol (ZYLOPRIM) 300 MG tablet Take 300 mg by mouth daily.       apixaban (ELIQUIS) 5 MG TABS tablet TAKE 1 TABLET BY MOUTH TWICE A DAY 180 tablet 1   atorvastatin (LIPITOR) 20 MG tablet TAKE 1 TABLET BY MOUTH EVERY  DAY *PT OVERDUE FOR OFFICE VISIT* 30 tablet 5   carvedilol (COREG) 12.5 MG tablet Take 1 tablet (12.5 mg total) by mouth 2 (two) times daily. 180 tablet 3   cholecalciferol (VITAMIN D) 25 MCG (1000 UNIT) tablet Take 1,000 Units by mouth daily.       collagenase (SANTYL) ointment Apply 1 application topically daily. Measurements to right hallux 2  X 1 x 0.1cm (Patient taking differently: Apply 1 application topically daily. Measurements to right hallux 2  X 1 x 0.1cm) 30 g 5   [employer] DIGESTIVE PROBIOTIC 250 MG capsule Take 500 mg by mouth daily.       KLOR-CON M20 20 MEQ tablet TAKE 1 TABLET BY MOUTH EVERY DAY 90 tablet 1   losartan (COZAAR) 50 MG tablet TAKE 1 TABLET BY MOUTH EVERY MORNING AND AT BEDTIME 180 tablet 2   Multiple Vitamin (MULTIVITAMIN WITH MINERALS) TABS tablet Take 1 tablet by mouth daily.       Omega 3 340 MG CPDR Take 1 tablet by mouth 2 (two) times daily.       omeprazole (PRILOSEC) 40 MG capsule Take 40 mg by mouth daily.       sertraline (ZOLOFT) 50 MG tablet Take 1 tablet (50 mg total) by mouth daily. 30 tablet 0   torsemide (DEMADEX) 20 MG tablet Take 2 tablets by mouth daily alternating with 1 tablet by mouth daily. 90 tablet 1   vitamin B-12 (CYANOCOBALAMIN) 1000 MCG tablet Take 1,000 mcg by mouth daily.       vitamin C (VITAMIN C) 500 MG tablet Take 1 tablet (500 mg total) by mouth 3 (three) times daily.          Drug Regimen Review  Drug regimen was reviewed and remains appropriate with no significant issues identified   Home: Home Living Family/patient expects to be discharged to:: Private residence Living Arrangements: Alone Available Help at Discharge: Friend(s), Available PRN/intermittently Type of Home: [place]: One level Bathroom Shower/Tub: Tub/shower unit, Multimedia programmer: Standard Home Equipment: Environmental consultant - 2 wheels, [employer] - single point Additional Comments: awaiting cam boot   Functional History: Prior Function Level of  Independence: Independent with assistive device(s) 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SPC, working as Theme park manager   Functional Status:  Mobility: [place] bed mobility: Needs Assistance Bed Mobility: Sit to  Supine Supine to sit: Supervision Sit to supine: Min assist General bed mobility comments: supervision for safety Transfers Overall transfer level: Needs assistance Equipment used: Rolling walker (2 wheeled), 2 person hand held assist Transfers: Sit to/from Stand, Stand Pivot Transfers Sit to Stand: Max assist, +2 physical assistance, +2 safety/equipment Stand pivot transfers: Max assist, +2 physical assistance, +2 safety/equipment General transfer comment: cues for hand placement, body positioning and WB status - pt did not adhere to WB status well during sit<>stand ttransition and when pivoting R foot - encouraged a/p transfers or steady transfers with +2 A Ambulation/Gait Ambulation/Gait assistance: Mod assist, +2 safety/equipment Gait Distance (Feet): 4 Feet (x2 trials.  Pt had difficulty with foot clearance after 4 ft of gt training and required seated surface to be moved to his backside to prepare for sitting.) Assistive device: Rolling walker (2 wheeled) Gait Pattern/deviations: Step-to pattern, Trunk flexed General Gait Details: Decreased foot clearance on R and decreased strength to maintain weight bearing on L LE.  Performed limited short bouts of gt with step to ( hop to pattern).  Pt able to pivot without hoping to move from bed to recliner.   ADL: ADL Overall ADL's : Needs assistance/impaired Eating/Feeding: Set up, Sitting Grooming: Set up, Sitting Upper Body Bathing: Set up, Sitting Lower Body Bathing: Maximal assistance, Sit to/from stand, +2 for safety/equipment Upper Body Dressing : Set up, Sitting Lower Body Dressing: Maximal assistance, +2 for physical assistance, +2 for safety/equipment, Sit to/from stand Toilet Transfer: Maximal assistance, +2 for safety/equipment,  +2 for physical assistance, BSC, Stand-pivot, RW Toilet Transfer Details (indicate cue type and reason): +1 assist for holding foot of of ground to maintain WB status Toileting- Clothing Manipulation and Hygiene: Maximal assistance, +2 for physical assistance, +2 for safety/equipment, Sit to/from stand Functional mobility during ADLs: Maximal assistance, +2 for physical assistance, +2 for safety/equipment, Rolling walker, Cueing for safety General ADL Comments: Pt  encouraged to complete a/p transfers for [facility] and chair transfers to maintain WB status   Cognition: Cognition Overall Cognitive Status: Impaired/Different from baseline Orientation Level: Oriented X4 Cognition Arousal/Alertness: Awake/alert Behavior During Therapy: WFL for tasks assessed/performed Overall Cognitive Status: Impaired/Different from baseline Area of Impairment: Safety/judgement Safety/Judgement: Decreased awareness of safety, Decreased awareness of deficits General Comments: Pt is motivated to get better and home     Blood pressure (!) 145/75, pulse 63, temperature 98.2 F (36.8 C), temperature source Oral, resp. rate 20, height '5\' 11"'$  (1.803 m), weight (!) 155.6 kg, SpO2 98 %. Physical Exam Constitutional:      Appearance: He is obese. He is not ill-appearing. HENT:    Head: Normocephalic and atraumatic.    Right Ear: External ear normal.    Left Ear: External ear normal.    Nose: Nose normal.    Mouth/Throat:    Mouth: Mucous membranes are moist.    Pharynx: Oropharynx is clear. Eyes:    Extraocular Movements: Extraocular movements intact.    Conjunctiva/sclera: Conjunctivae normal.    Pupils: Pupils are equal, round, and reactive to light. Cardiovascular:    Rate and Rhythm: Normal rate and regular rhythm.    Heart sounds: No murmur heard. Pulmonary:    Effort: Pulmonary effort is normal. No respiratory distress.    Breath sounds: No wheezing. Abdominal:    General: Bowel sounds are normal. There  is no distension.    Palpations: Abdomen is soft.    Tenderness: There is no abdominal tenderness. Genitourinary:    Rectum: Guaiac stool: urine clear, yellow.  Musculoskeletal:    Cervical back: Normal range of motion.    Right lower leg: Edema present.    Left lower leg: Edema present. Skin:    Comments: Chronic vascular changes in bilateral legs. Calcified plaques on the dorsum of right foot, toes as well as right ankle. Left TMA dressed  Neurological:    Comments: Alert and oriented x 3. Normal insight and awareness. Intact Memory. Normal language and speech. Cranial nerve exam unremarkable. UE 5/5. Able to lift left leg off bed and move ankle. RLE 3-4/5 prox to distal. Stocking glove sensory loss below the ankles bilaterally. DTR's 1+  Psychiatric:        Mood and Affect: Mood normal.        Behavior: Behavior normal.        Thought Content: Thought content normal.     Lab Results Last 48 Hours        Results for orders placed or performed during the hospital encounter of 08/03/20 (from the past 48 hour(s))  Glucose, capillary     Status: Abnormal    Collection Time: 08/09/20  5:25 PM  Result Value Ref Range    Glucose-Capillary 112 (H) 70 - 99 mg/dL      Comment: Glucose reference range applies only to samples taken after fasting for at least 8 hours.  Glucose, capillary     Status: Abnormal    Collection Time: 08/09/20  8:43 PM  Result Value Ref Range    Glucose-Capillary 123 (H) 70 - 99 mg/dL      Comment: Glucose reference range applies only to samples taken after fasting for at least 8 hours.  Glucose, capillary     Status: None    Collection Time: 08/10/20  7:29 AM  Result Value Ref Range    Glucose-Capillary 91 70 - 99 mg/dL      Comment: Glucose reference range applies only to samples taken after fasting for at least 8 hours.  Glucose, capillary     Status: Abnormal    Collection Time: 08/10/20 12:28 PM  Result Value Ref Range    Glucose-Capillary 112 (H) 70 - 99  mg/dL      Comment: Glucose reference range applies only to samples taken after fasting for at least 8 hours.  Glucose, capillary     Status: None    Collection Time: 08/10/20  5:16 PM  Result Value Ref Range    Glucose-Capillary 99 70 - 99 mg/dL      Comment: Glucose reference range applies only to samples taken after fasting for at least 8 hours.  Glucose, capillary     Status: Abnormal    Collection Time: 08/10/20 10:00 PM  Result Value Ref Range    Glucose-Capillary 121 (H) 70 - 99 mg/dL      Comment: Glucose reference range applies only to samples taken after fasting for at least 8 hours.  Glucose, capillary     Status: None    Collection Time: 08/11/20  8:54 AM  Result Value Ref Range    Glucose-Capillary 99 70 - 99 mg/dL      Comment: Glucose reference range applies only to samples taken after fasting for at least 8 hours.  Vancomycin, trough     Status: Abnormal    Collection Time: 08/11/20  9:52 AM  Result Value Ref Range    Vancomycin Tr 22 (HH) 15 - 20 ug/mL      Comment: CRITICAL RESULT CALLED TO, READ BACK BY AND VERIFIED WITH: JULIET  [facility] RN [phone] HX:[id number] BY A BENNETT Performed at [facility], [place] [address], [place], [place] [address]    Glucose, capillary     Status: Abnormal    Collection Time: 08/11/20 12:01 PM  Result Value Ref Range    Glucose-Capillary 113 (H) 70 - 99 mg/dL      Comment: Glucose reference range applies only to samples taken after fasting for at least 8 hours.      Imaging Results (Last 48 hours)  No results found.           Medical Problem List and Plan: 1.  Functional and mobility deficits secondary to gangrene/osteomyelitis of left foot which required a TMA             -patient may shower if left foot is covered             -ELOS/Goals: 12-14 days, supervision goals with PT and OT             -pt is NWB with CAM Boot LLE 2.  Antithrombotics: -DVT/anticoagulation:  Pharmaceutical: Other (comment) Eliquis              -antiplatelet therapy: N/A 3. Pain Management: Oxycodone prn 4. Mood: LCSW to follow for evaluation and support.              -antipsychotic agents: N/A 5. Neuropsych: This patient is capable of making decisions on his own behalf. 6. Skin/Wound Care: Routine pressure relief             -redress wound tomorrow 7. Fluids/Electrolytes/Nutrition: Monitor I/O. Check CMET in am. 8. T2DM: Hgb A1c-6.4.  Continue Levemir with sliding scale insulin.             -- Monitor blood sugars ac/HS.  Use sliding scale insulin for tighter control. 9. Left foot infection: TO continue doxycycline and Augmentin-->end date 08/14.             --add vitamin C and Zinc. Continue Juven to promote wound healing.   10. Chronic systolic CHF: Monitor for signs of overload.  Check weights daily.   --Continue Cozaar, coreg, Lipitor and Demadex. 11. CAF: Monitor HR TID--on Eliquis and Coreg for rate control.         Bary Leriche, PA-C 08/11/2020   I have personally performed a face to face diagnostic evaluation of this patient and formulated the key components of the plan.  Additionally, I have personally reviewed laboratory data, imaging studies, as well as relevant notes and concur with the physician assistant's documentation above.  The patient's status has not changed from the original H&P.  Any changes in documentation from the acute care chart have been noted above.  Meredith Staggers, MD, [facility]

## 2020-08-11 NOTE — Progress Notes (Signed)
Pharmacy Antibiotic Note  [name] is a 65 y.o. male admitted on 08/03/2020 with toe osteomyelitis s/p amputation. Continuing abx per podiatry.  Pharmacy has been consulted for Vanco dosing.  Vanc trough 22 is supratherapeutic but IV antibiotics already changed to PO.   Plan: Stop vancomycin and Unasyn Start doxy and Augmentin for 14 days   Height: '5\' 11"'$  (180.3 cm) Weight: (!) 155.6 kg (343 lb) IBW/kg (Calculated) : 75.3  Temp (24hrs), Avg:98.1 F (36.7 C), Min:97.6 F (36.4 C), Max:98.7 F (37.1 C)  Recent Labs  Lab 08/05/20 0119 08/09/20 0053 08/11/20 0952  WBC 7.5  --   --   CREATININE 0.83 1.00  --   VANCOTROUGH  --   --  22*     Estimated Creatinine Clearance: 111.9 mL/min (by C-G formula based on SCr of 1 mg/dL).    Allergies  Allergen Reactions   Latex Hives and Swelling   Antimicrobials Vanco 7/26 >>8/1 Aztreonam 7/26 >>7/28 Flagyl 7/26 >>7/30 Unasyn 7/28 >>8/2 Augmentin 8/2>>8/14 Doxy 8/2>>8/14  Microbiology 7/27: MRSA PCR: neg but SA positive 7/27: COVID neg 7/25: BC x 2 ngtd  Benetta Spar, PharmD, BCPS, [facility] Clinical Pharmacist  Please check AMION for all [place] phone numbers After 10:00 PM, call [place] [phone]

## 2020-08-11 NOTE — Progress Notes (Signed)
Pt arrived to 4W18 per bed. Wife at bedside. Reviewed plan of care, policies,pt/family oriented to rehab. No further questions. Vitals obtained. Call light in reach. Sheela Stack, LPN

## 2020-08-11 NOTE — Progress Notes (Signed)
PMR Admission Coordinator Pre-Admission Assessment   Patient: [name] is an 65 y.o., male MRN: [id number] DOB: July 02, 1955 Height: _0  (180.3 cm) Weight: (!) 155.6 kg   Insurance Information HMO:     PPO:      PCP:      IPA:      80/20:      OTHER: PRIMARY: BCBS       Policy#: [id number]      Subscriber: Pt. [id number] CM Name: Maurilio Lovely      Phone#:      Fax#: [phone]  Received a fax from Maurilio Lovely at [facility] with approval for admission on 08/11/20 with clinical updates due on 4/82/50 Pre-Cert#: [id number]       Employer Self Fax: [phone]       Phone: [phone] Eff Date: 01/11/2020 - still active Deductible: $3,800 ($3,800 met) OOP Max: $8,700 ($5,714.81 met) CIR: 60% coverage, 40% co-insurance SNF: 60% coverage, 40% co-insurance; limited to 67 days/cal yr (75 remaining) Outpatient:  $50 co-pay/visit; limited to 30 visits combined PT/OT limit and 30/ST visit limit Home Health:  60% coverage, 40% co-insurance DME: 60% coverage, 40% co-insurance Providers: in network  SECONDARY: Medicare Part A and B       Policy#: [id number]         Financial Counselor:       Phone#:   The Therapist, art Information Summary" for patients in Inpatient Rehabilitation Facilities with attached "Privacy Act [place] Records" was provided and verbally reviewed with: N/A   Emergency Contact Information Contact Information       Name Relation Home Work [place] [phone]   [phone]    [place] Sister   [phone]           Current Medical History  Patient Admitting Diagnosis: L Transmetatarsal Amputation  History of Present Illness: [name] is a 65 y.o. male with medical history significant of follicular lymphoma; morbid obesity; chronic systolic CHF; afib; DM; HLD; and HTN presenting with L foot infection.  Last year, he had his L great toe removed - he wore water shoes in [place] and it  got infected on the bottom.  After it healed, he started wearing shoes again and the other 2 toes took the brunt of the weight and got infected.  He took antibiotics only once for this. They have been infected and he was seen yesterday and was told to come in for IV antibiotics and likely further amputation.  Pt. Presented to [facility] 08/04/20 and underwent L transmetartarsal amputation with podiatry on 08/06/20. CIR was consulted to assist retrurn to PLOF.    Patient's medical record from [facility] has been reviewed by the rehabilitation admission coordinator and physician.   Past Medical History      Past Medical History:  Diagnosis Date   Acute systolic HF (heart failure) (HCC)     Arthritis      lt foot, hips knees   Atrial fibrillation ([place])     Diabetes mellitus, new onset ([place])     Diverticulosis     Dyslipidemia     Gout attack 01/16/2012   Hypertension     Internal hemorrhoids     Lymphoma ([place])      remission for about 2 years, chemo 3 years prior   Pneumonia due to COVID-19 virus 07/2018   Pulmonary hypertension ([place])     Sepsis ([place]) 02/2017  secondary to influenza; requiring trach   Tubular adenoma of colon     Venous stasis ulcers ([place])        Family History   family history includes Arthritis in his mother; CVA (age of onset: 88) in his sister; Dementia in his mother; Dementia (age of onset: 63) in his brother; Hypertension in his mother.   Prior Rehab/Hospitalizations Has the patient had prior rehab or hospitalizations prior to admission? No   Has the patient had major surgery during 100 days prior to admission? Yes              Current Medications   Current Facility-Administered Medications:   acetaminophen (TYLENOL) tablet 650 mg, 650 mg, Oral, Q6H PRN, 650 mg at 08/07/20 1517 **OR** acetaminophen (TYLENOL) suppository 650 mg, 650 mg, Rectal, Q6H PRN, Karmen Bongo, MD   allopurinol (ZYLOPRIM) tablet 300 mg, 300 mg, Oral,  Daily, Karmen Bongo, MD, 300 mg at 08/11/20 1019   amoxicillin-clavulanate (AUGMENTIN) 875-125 MG per tablet 1 tablet, 1 tablet, Oral, Q12H, Charlynne Cousins, MD, 1 tablet at 08/11/20 1025   apixaban (ELIQUIS) tablet 5 mg, 5 mg, Oral, BID, Charlynne Cousins, MD, 5 mg at 08/11/20 1020   atorvastatin (LIPITOR) tablet 20 mg, 20 mg, Oral, Daily, Karmen Bongo, MD, 20 mg at 08/11/20 1019   bisacodyl (DULCOLAX) EC tablet 5 mg, 5 mg, Oral, Daily PRN, Karmen Bongo, MD   carvedilol (COREG) tablet 12.5 mg, 12.5 mg, Oral, BID, Karmen Bongo, MD, 12.5 mg at 08/11/20 1020   docusate sodium (COLACE) capsule 100 mg, 100 mg, Oral, BID, Karmen Bongo, MD, 100 mg at 08/07/20 1022   doxycycline (VIBRA-TABS) tablet 100 mg, 100 mg, Oral, Q12H, Charlynne Cousins, MD, 100 mg at 08/11/20 1025   hydrALAZINE (APRESOLINE) injection 5 mg, 5 mg, Intravenous, Q4H PRN, Karmen Bongo, MD   insulin aspart (novoLOG) injection 0-15 Units, 0-15 Units, Subcutaneous, TID WC, Karmen Bongo, MD, 2 Units at 08/08/20 1710   insulin aspart (novoLOG) injection 0-5 Units, 0-5 Units, Subcutaneous, QHS, Karmen Bongo, MD   insulin detemir (LEVEMIR) injection 5 Units, 5 Units, Subcutaneous, Daily, Charlynne Cousins, MD, 5 Units at 08/11/20 1020   losartan (COZAAR) tablet 25 mg, 25 mg, Oral, BID, Charlynne Cousins, MD, 25 mg at 08/11/20 1025   multivitamin with minerals tablet 1 tablet, 1 tablet, Oral, Daily, Charlynne Cousins, MD, 1 tablet at 08/11/20 1019   nutrition supplement (JUVEN) (JUVEN) powder packet 1 packet, 1 packet, Oral, BID BM, Charlynne Cousins, MD, 1 packet at 08/10/20 1358   ondansetron (ZOFRAN) tablet 4 mg, 4 mg, Oral, Q6H PRN **OR** ondansetron (ZOFRAN) injection 4 mg, 4 mg, Intravenous, Q6H PRN, Karmen Bongo, MD   oxyCODONE-acetaminophen (PERCOCET/ROXICET) 5-325 MG per tablet 1-2 tablet, 1-2 tablet, Oral, Q4H PRN, Charlynne Cousins, MD, 2 tablet at 08/11/20 1019   pantoprazole  (PROTONIX) EC tablet 40 mg, 40 mg, Oral, Daily, Karmen Bongo, MD, 40 mg at 08/11/20 1019   polyethylene glycol (MIRALAX / GLYCOLAX) packet 17 g, 17 g, Oral, Daily PRN, Karmen Bongo, MD   potassium chloride SA (KLOR-CON) CR tablet 20 mEq, 20 mEq, Oral, Daily, Charlynne Cousins, MD, 20 mEq at 08/11/20 1019   sertraline (ZOLOFT) tablet 50 mg, 50 mg, Oral, Daily, Karmen Bongo, MD, 50 mg at 08/11/20 1019   torsemide (DEMADEX) tablet 20 mg, 20 mg, Oral, Daily, Charlynne Cousins, MD, 20 mg at 08/11/20 1020   Facility-Administered Medications Ordered in Other Encounters:   sodium chloride  0.9 % injection 10 mL, 10 mL, Intravenous, PRN, Curt Bears, MD, 10 mL at 05/04/15 1529   Patients Current Diet:  Diet Order                  Diet - low sodium heart healthy             Diet heart healthy/carb modified Room service appropriate? Yes; Fluid consistency: Thin  Diet effective now                         Precautions / Restrictions Precautions Precautions: Fall Precaution Comments: venous stasis Other Brace: L darco shoe Restrictions Weight Bearing Restrictions: Yes LLE Weight Bearing: Non weight bearing Other Position/Activity Restrictions: Pt has difficulty maintaining weight bearing in standing and fatigues quickly.    Has the patient had 2 or more falls or a fall with injury in the past year? No   Prior Activity Level Community (5-7x/wk): PT. working and active in the community PTA   Prior Functional Level Self Care: Did the patient need help bathing, dressing, using the toilet or eating? Independent   Indoor Mobility: Did the patient need assistance with walking from room to room (with or without device)? Independent   Stairs: Did the patient need assistance with internal or external stairs (with or without device)? Independent   Functional Cognition: Did the patient need help planning regular tasks such as shopping or remembering to take medications?  Independent   Home Assistive Devices / Equipment Home Equipment: Walker - 2 wheels, Cane - single point   Prior Device Use: Indicate devices/aids used by the patient prior to current illness, exacerbation or injury? None of the above   Current Functional Level Cognition   Overall Cognitive Status: Impaired/Different from baseline Orientation Level: Oriented X4 Safety/Judgement: Decreased awareness of safety, Decreased awareness of deficits General Comments: Pt is motivated to get better and home    Extremity Assessment (includes Sensation/Coordination)   Upper Extremity Assessment: Overall WFL for tasks assessed  Lower Extremity Assessment: Defer to PT evaluation LLE Deficits / Details: LLE NWB and hip/knee are weaker than RLE LLE Coordination: decreased gross motor     ADLs   Overall ADL's : Needs assistance/impaired Eating/Feeding: Set up, Sitting Grooming: Set up, Sitting Upper Body Bathing: Set up, Sitting Lower Body Bathing: Maximal assistance, Sit to/from stand, +2 for safety/equipment Upper Body Dressing : Set up, Sitting Lower Body Dressing: Maximal assistance, +2 for physical assistance, +2 for safety/equipment, Sit to/from stand Toilet Transfer: Maximal assistance, +2 for safety/equipment, +2 for physical assistance, BSC, Stand-pivot, RW Toilet Transfer Details (indicate cue type and reason): +1 assist for holding foot of of ground to maintain WB status Toileting- Clothing Manipulation and Hygiene: Maximal assistance, +2 for physical assistance, +2 for safety/equipment, Sit to/from stand Functional mobility during ADLs: Maximal assistance, +2 for physical assistance, +2 for safety/equipment, Rolling walker, Cueing for safety General ADL Comments: Pt  encouraged to complete a/p transfers for [facility] and chair transfers to maintain WB status     Mobility   Overal bed mobility: Needs Assistance Bed Mobility: Sit to Supine Supine to sit: Supervision Sit to supine: Min  assist General bed mobility comments: supervision for safety     Transfers   Overall transfer level: Needs assistance Equipment used: Rolling walker (2 wheeled), 2 person hand held assist Transfers: Sit to/from Stand, Stand Pivot Transfers Sit to Stand: Max assist, +2 physical assistance, +2 safety/equipment Stand pivot transfers: Max assist, +2  physical assistance, +2 safety/equipment General transfer comment: cues for hand placement, body positioning and WB status - pt did not adhere to WB status well during sit<>stand ttransition and when pivoting R foot - encouraged a/p transfers or steady transfers with +2 A     Ambulation / Gait / Stairs / Wheelchair Mobility   Ambulation/Gait Ambulation/Gait assistance: Mod assist, +2 safety/equipment Gait Distance (Feet): 4 Feet (x2 trials.  Pt had difficulty with foot clearance after 4 ft of gt training and required seated surface to be moved to his backside to prepare for sitting.) Assistive device: Rolling walker (2 wheeled) Gait Pattern/deviations: Step-to pattern, Trunk flexed General Gait Details: Decreased foot clearance on R and decreased strength to maintain weight bearing on L LE.  Performed limited short bouts of gt with step to ( hop to pattern).  Pt able to pivot without hoping to move from bed to recliner.     Posture / Balance Balance Overall balance assessment: Needs assistance Sitting-balance support: Single extremity supported Sitting balance-Leahy Scale: Good Standing balance support: Bilateral upper extremity supported Standing balance-Leahy Scale: Poor     Special needs/care consideration Skin Surgical incision  and Diabetic management DM2 managed with Novolog and Levemir ,  Orthotics: In L Darco shoe    Previous Home Environment (from acute therapy documentation) Living Arrangements: Alone Available Help at Discharge: Friend(s), Available PRN/intermittently Type of Home: House Home Layout: One level Bathroom Shower/Tub:  Tub/shower unit, Multimedia programmer: Standard Additional Comments: awaiting cam boot   Discharge Living Setting Plans for Discharge Living Setting: Patient's home Type of Home at Discharge: House Discharge Home Layout: One level Discharge Home Access: Stairs to enter Discharge Bathroom Shower/Tub: Tub/shower unit Discharge Bathroom Toilet: Standard Discharge Bathroom Accessibility: Yes How Accessible: Accessible via walker   Social/Family/Support Systems Patient Roles: Other (Comment) Contact Information: [phone] Anticipated Caregiver: Mariea Stable Anticipated Caregiver's Contact Information: [phone] Ability/Limitations of Caregiver: can provide min A Caregiver Availability: 24/7 Discharge Plan Discussed with Primary Caregiver: Yes Is Caregiver In Agreement with Plan?: Yes   Goals Patient/Family Goal for Rehab: PT/OT Supervision Expected length of stay: 12-14 days Pt/Family Agrees to Admission and willing to participate: Yes Program Orientation Provided & Reviewed with Pt/Caregiver Including Roles  & Responsibilities: Yes   Decrease burden of Care through IP rehab admission: Specialzed equipment needs, Decrease number of caregivers, Bowel and bladder program, and Patient/family education   Possible need for SNF placement upon discharge: not anticipated    Patient Condition: I have reviewed medical records from [facility] , spoken with CM, and patient. I met with patient at the bedside for inpatient rehabilitation assessment.  Patient will benefit from ongoing PT and OT, can actively participate in 3 hours of therapy a day 5 days of the week, and can make measurable gains during the admission.  Patient will also benefit from the coordinated team approach during an Inpatient Acute Rehabilitation admission.  The patient will receive intensive therapy as well as Rehabilitation physician, nursing, social worker, and care management interventions.   Due to safety, skin/wound care, disease management, medication administration, pain management, and patient education the patient requires 24 hour a day rehabilitation nursing.  The patient is currently min-Max A with mobility and basic ADLs.  Discharge setting and therapy post discharge at home with home health is anticipated.  Patient has agreed to participate in the Acute Inpatient Rehabilitation Program and will admit today.   Preadmission Screen Completed By:  Genella Mech, 08/11/2020 10:25 AM  ______________________________________________________________________   Discussed status with Dr. Naaman Plummer on 08/11/20 at 75 and received approval for admission today.   Admission Coordinator:  Genella Mech, CCC-SLP, time 1038/Date 08/11/20    Assessment/Plan: Diagnosis: left TMA Does the need for close, 24 hr/day Medical supervision in concert with the patient's rehab needs make it unreasonable for this patient to be served in a less intensive setting? Yes Co-Morbidities requiring supervision/potential complications: dm, gout, morbid obesity, chf Due to bladder management, bowel management, safety, skin/wound care, disease management, medication administration, pain management, and patient education, does the patient require 24 hr/day rehab nursing? Yes Does the patient require coordinated care of a physician, rehab nurse, PT, OT  to address physical and functional deficits in the context of the above medical diagnosis(es)? Yes Addressing deficits in the following areas: balance, endurance, locomotion, strength, transferring, bowel/bladder control, bathing, dressing, feeding, grooming, toileting, and psychosocial support Can the patient actively participate in an intensive therapy program of at least 3 hrs of therapy 5 days a week? Yes The potential for patient to make measurable gains while on inpatient rehab is excellent Anticipated functional outcomes upon discharge from inpatient rehab: supervision  PT, supervision OT, n/a SLP Estimated rehab length of stay to reach the above functional goals is: 12-14 days Anticipated discharge destination: Home 10. Overall Rehab/Functional Prognosis: excellent     MD Signature: Meredith Staggers, MD, [facility] 08/11/2020

## 2020-08-11 NOTE — Progress Notes (Addendum)
Critical lab for vancomycin trough is 22. MD and pharmacy aware.

## 2020-08-11 NOTE — Progress Notes (Signed)
[facility] PROGRESS NOTE    Progress Note  [name]  [id number] DOB: May 01, 1955 DOA: 08/03/2020 PCP: Ginger Organ., MD     Brief Narrative:   [name] is an 65 y.o. male past medical history significant for follicular lymphoma morbid obesity, chronic systolic heart failure atrial fibrillation on apixaban, diabetes mellitus type 2 essential hypertension, with a recent amputation of the left great toe last year presents with a left foot infection  Awaiting insurance approval for CIR  Assessment/Plan:   Diabetic foot infection with osteomyelitis of second and third toe: Status post amputation second and third toe and metatarsals on 08/06/2018 Podiatry to consult vascular as an outpatient if needed. To continue antibiotics IV on home oral tomorrow.  Podiatrist to dictate which oral antibiotics. Physical therapy evaluated the patient recommended skilled nursing facility, the patient is nonweightbearing.  Chronic systolic heart failure: With an EF to 45% appears to be compensated continue monitor. Resume ACE inhibitor and diuretic therapy.  Recheck a basic metabolic panel in the morning.  Chronic atrial fibrillation: Rate controlled Coreg, resume Eliquis.  Controlled diabetes mellitus type 2: with an A1c of 6.3, tolerating his diet his blood glucose trending up long-acting insulin.  Hyperlipidemia:  Excellent continue Lipitor.  Essential hypertension: Continue Coreg, Cozaar and torsemide.  Recent potassium supplementation.  Follicular lymphoma: In remission.  Obesity: BMI 52.   Unstageable left buttock ulcer present on admission: RN Pressure Injury Documentation: Pressure Injury 02/25/17 Unstageable - Full thickness tissue loss in which the base of the ulcer is covered by slough (yellow, tan, gray, green or brown) and/or eschar (tan, brown or black) in the wound bed. Patchy areas of partial thickness skin loss re (Active)  02/25/17 0700  Location:  Buttocks  Location Orientation: Left  Staging: Unstageable - Full thickness tissue loss in which the base of the ulcer is covered by slough (yellow, tan, gray, green or brown) and/or eschar (tan, brown or black) in the wound bed.  Wound Description (Comments): Patchy areas of partial thickness skin loss related to moisture associated skin damage and fungal rash; present on admission to rehab  Present on Admission: Yes      DVT prophylaxis: lovenox Family Communication:none Status is: Inpatient  Remains inpatient appropriate because:Hemodynamically unstable  Dispo: The patient is from: Home              Anticipated d/c is to: Home              Patient currently is not medically stable to d/c.   Difficult to place patient No     Code Status:     Code Status Orders  (From admission, onward)           Start     Ordered   08/04/20 1243  Full code  Continuous        08/04/20 1243           Code Status History     Date Active Date Inactive Code Status Order ID Comments User Context   03/22/2017 1535 04/06/2017 1824 Full Code [id number]  Flora Lipps Inpatient   03/22/2017 1535 03/22/2017 1535 Full Code [id number]  Flora Lipps Inpatient   02/16/2017 2121 03/22/2017 1532 Full Code [id number]  Arnell Asal, NP ED         IV Access:   Peripheral IV   Procedures and diagnostic studies:   No results found.   Medical Consultants:   None.  Subjective:    [name] pain is controlled had a bowel movement.  Objective:    Vitals:   08/10/20 2203 08/11/20 0137 08/11/20 0524 08/11/20 0902  BP: 138/62 127/72 (!) 156/84 (!) 145/75  Pulse: 63 (!) 53 (!) 55 63  Resp: '20 18 18 20  '$ Temp: 98.1 F (36.7 C) 98 F (36.7 C) 97.6 F (36.4 C) 98.2 F (36.8 C)  TempSrc: Oral Oral Oral Oral  SpO2: 94% 97% 98%   Weight:      Height:       SpO2: 98 % O2 Flow Rate (L/min): 2 L/min   Intake/Output Summary (Last 24 hours) at 08/11/2020 0937 Last  data filed at 08/11/2020 [id number] Gross per 24 hour  Intake 830 ml  Output 3221 ml  Net -2391 ml    Filed Weights   08/03/20 2047 08/05/20 1449  Weight: (!) 170 kg (!) 155.6 kg    Exam: General exam: In no acute distress. Respiratory system: Good air movement and clear to auscultation. Cardiovascular system: S1 & S2 heard, RRR. No JVD. Gastrointestinal system: Abdomen is nondistended, soft and nontender.  Extremities: No pedal edema. Skin: No rashes, lesions or ulcers  Data Reviewed:    Labs: Basic Metabolic Panel: Recent Labs  Lab 08/05/20 0119 08/09/20 0053  NA 135 136  K 4.0 3.7  CL 107 104  CO2 21* 22  GLUCOSE 134* 101*  BUN 18 20  CREATININE 0.83 1.00  CALCIUM 8.6* 8.5*    GFR Estimated Creatinine Clearance: 111.9 mL/min (by C-G formula based on SCr of 1 mg/dL). Liver Function Tests: No results for input(s): AST, ALT, ALKPHOS, BILITOT, PROT, ALBUMIN in the last 168 hours.  No results for input(s): LIPASE, AMYLASE in the last 168 hours. No results for input(s): AMMONIA in the last 168 hours. Coagulation profile No results for input(s): INR, PROTIME in the last 168 hours. COVID-19 Labs  No results for input(s): DDIMER, FERRITIN, LDH, CRP in the last 72 hours.   Lab Results  Component Value Date   SARSCOV2NAA NEGATIVE 08/05/2020   SARSCOV2NAA NEGATIVE 07/22/2020   [place] NEGATIVE 07/06/2020   [place] NEGATIVE 05/03/2019    CBC: Recent Labs  Lab 08/05/20 0119  WBC 7.5  HGB 12.7*  HCT 40.1  MCV 97.8  PLT 243    Cardiac Enzymes: No results for input(s): CKTOTAL, CKMB, CKMBINDEX, TROPONINI in the last 168 hours. BNP (last 3 results) No results for input(s): PROBNP in the last 8760 hours. CBG: Recent Labs  Lab 08/10/20 0729 08/10/20 1228 08/10/20 1716 08/10/20 2200 08/11/20 0854  GLUCAP 91 112* 99 121* 99    D-Dimer: No results for input(s): DDIMER in the last 72 hours. Hgb A1c: No results for input(s): HGBA1C in the last 72  hours.  Lipid Profile: No results for input(s): CHOL, HDL, LDLCALC, TRIG, CHOLHDL, LDLDIRECT in the last 72 hours. Thyroid function studies: No results for input(s): TSH, T4TOTAL, T3FREE, THYROIDAB in the last 72 hours.  Invalid input(s): FREET3 Anemia work up: No results for input(s): VITAMINB12, FOLATE, FERRITIN, TIBC, IRON, RETICCTPCT in the last 72 hours. Sepsis Labs: Recent Labs  Lab 08/05/20 0119  WBC 7.5    Microbiology Recent Results (from the past 240 hour(s))  Blood culture (routine x 2)     Status: None   Collection Time: 08/03/20  8:45 PM   Specimen: BLOOD  Result Value Ref Range Status   Specimen Description BLOOD LEFT ARM  Final   Special Requests   Final  BOTTLES DRAWN AEROBIC AND ANAEROBIC Blood Culture adequate volume   Culture   Final    NO GROWTH 5 DAYS Performed at [facility], [place] [address] [place], [place] [address]    Report Status 08/08/2020 FINAL  Final  Blood culture (routine x 2)     Status: None   Collection Time: 08/03/20  8:45 PM   Specimen: BLOOD  Result Value Ref Range Status   Specimen Description BLOOD LEFT ARM  Final   Special Requests   Final    BOTTLES DRAWN AEROBIC ONLY Blood Culture results may not be optimal due to an excessive volume of blood received in culture bottles   Culture   Final    NO GROWTH 5 DAYS Performed at [facility], [place] [address], [place], [place] [address]    Report Status 08/08/2020 FINAL  Final  Surgical pcr screen     Status: Abnormal   Collection Time: 08/05/20 12:23 AM   Specimen: Nasopharyngeal Swab; Nasal Swab  Result Value Ref Range Status   MRSA, PCR NEGATIVE NEGATIVE Final   Staphylococcus aureus POSITIVE (A) NEGATIVE Final    Comment: (NOTE) The Xpert SA Assay (FDA approved for NASAL specimens in patients 11 years of age and older), is one component of a comprehensive surveillance program. It is not intended to diagnose infection nor to guide or monitor treatment. Performed  at [facility], [place] [address] [place], [place] [address]   SARS CORONAVIRUS 2 (TAT 6-24 HRS) Nasopharyngeal Nasopharyngeal Swab     Status: None   Collection Time: 08/05/20 12:51 AM   Specimen: Nasopharyngeal Swab  Result Value Ref Range Status   SARS Coronavirus 2 NEGATIVE NEGATIVE Final    Comment: (NOTE) SARS-CoV-2 target nucleic acids are NOT DETECTED.  The SARS-CoV-2 RNA is generally detectable in upper and lower respiratory specimens during the acute phase of infection. Negative results do not preclude SARS-CoV-2 infection, do not rule out co-infections with other pathogens, and should not be used as the sole basis for treatment or other patient management decisions. Negative results must be combined with clinical observations, patient history, and epidemiological information. The expected result is Negative.  Fact Sheet for Patients: SugarRoll.be  Fact Sheet for Healthcare Providers: https://www.woods-mathews.com/  This test is not yet approved or cleared by the [place] FDA and  has been authorized for detection and/or diagnosis of SARS-CoV-2 by FDA under an Emergency Use Authorization (EUA). This EUA will remain  in effect (meaning this test can be used) for the duration of the COVID-19 declaration under Se ction 564(b)(1) of the Act, 21 U.S.C. section 360bbb-3(b)(1), unless the authorization is terminated or revoked sooner.  Performed at [name], [place] [address] [place], [place] [address]      Medications:    allopurinol  300 mg Oral Daily   apixaban  5 mg Oral BID   atorvastatin  20 mg Oral Daily   carvedilol  12.5 mg Oral BID   docusate sodium  100 mg Oral BID   insulin aspart  0-15 Units Subcutaneous TID WC   insulin aspart  0-5 Units Subcutaneous QHS   insulin detemir  5 Units Subcutaneous Daily   losartan  25 mg Oral BID   multivitamin with minerals  1 tablet Oral Daily   nutrition  supplement (JUVEN)  1 packet Oral BID BM   pantoprazole  40 mg Oral Daily   potassium chloride SA  20 mEq Oral Daily  sertraline  50 mg Oral Daily   torsemide  20 mg Oral Daily   Continuous Infusions:  ampicillin-sulbactam (UNASYN) IV 3 g (08/11/20 0748)   vancomycin 1,500 mg (08/10/20 2256)      LOS: 7 days   Charlynne Cousins  [facility]  08/11/2020, 9:37 AM

## 2020-08-12 ENCOUNTER — Other Ambulatory Visit: Payer: Self-pay | Admitting: Cardiology

## 2020-08-12 DIAGNOSIS — Z89431 Acquired absence of right foot: Secondary | ICD-10-CM | POA: Diagnosis not present

## 2020-08-12 LAB — CBC WITH DIFFERENTIAL/PLATELET
Abs Immature Granulocytes: 0.06 10*3/uL (ref 0.00–0.07)
Basophils Absolute: 0.1 10*3/uL (ref 0.0–0.1)
Basophils Relative: 1 %
Eosinophils Absolute: 0.2 10*3/uL (ref 0.0–0.5)
Eosinophils Relative: 3 %
HCT: 37.1 % — ABNORMAL LOW (ref 39.0–52.0)
Hemoglobin: 12.2 g/dL — ABNORMAL LOW (ref 13.0–17.0)
Immature Granulocytes: 1 %
Lymphocytes Relative: 15 %
Lymphs Abs: 1.2 10*3/uL (ref 0.7–4.0)
MCH: 31.7 pg (ref 26.0–34.0)
MCHC: 32.9 g/dL (ref 30.0–36.0)
MCV: 96.4 fL (ref 80.0–100.0)
Monocytes Absolute: 0.8 10*3/uL (ref 0.1–1.0)
Monocytes Relative: 10 %
Neutro Abs: 5.7 10*3/uL (ref 1.7–7.7)
Neutrophils Relative %: 70 %
Platelets: 264 10*3/uL (ref 150–400)
RBC: 3.85 MIL/uL — ABNORMAL LOW (ref 4.22–5.81)
RDW: 14.8 % (ref 11.5–15.5)
WBC: 8.1 10*3/uL (ref 4.0–10.5)
nRBC: 0 % (ref 0.0–0.2)

## 2020-08-12 LAB — COMPREHENSIVE METABOLIC PANEL
ALT: 16 U/L (ref 0–44)
AST: 19 U/L (ref 15–41)
Albumin: 2.6 g/dL — ABNORMAL LOW (ref 3.5–5.0)
Alkaline Phosphatase: 96 U/L (ref 38–126)
Anion gap: 11 (ref 5–15)
BUN: 10 mg/dL (ref 8–23)
CO2: 28 mmol/L (ref 22–32)
Calcium: 8.8 mg/dL — ABNORMAL LOW (ref 8.9–10.3)
Chloride: 98 mmol/L (ref 98–111)
Creatinine, Ser: 0.8 mg/dL (ref 0.61–1.24)
GFR, Estimated: >60 mL/min (ref >60)
Glucose, Bld: 106 mg/dL — ABNORMAL HIGH (ref 70–99)
Potassium: 3.5 mmol/L (ref 3.5–5.1)
Sodium: 137 mmol/L (ref 135–145)
Total Bilirubin: 0.6 mg/dL (ref 0.3–1.2)
Total Protein: 6.4 g/dL — ABNORMAL LOW (ref 6.5–8.1)

## 2020-08-12 LAB — GLUCOSE, CAPILLARY
Glucose-Capillary: 104 mg/dL — ABNORMAL HIGH (ref 70–99)
Glucose-Capillary: 102 mg/dL — ABNORMAL HIGH (ref 70–99)
Glucose-Capillary: 102 mg/dL — ABNORMAL HIGH (ref 70–99)
Glucose-Capillary: 127 mg/dL — ABNORMAL HIGH (ref 70–99)

## 2020-08-12 MED ORDER — INSULIN ASPART 100 UNIT/ML IJ SOLN
0.0000 [IU] | Freq: Every day | INTRAMUSCULAR | Status: DC
Start: 2020-08-12 — End: 2020-08-12

## 2020-08-12 MED ORDER — INSULIN ASPART 100 UNIT/ML IJ SOLN: 0.0000 [IU] | Freq: Three times a day (TID) | INTRAMUSCULAR | Status: DC

## 2020-08-12 MED ORDER — ENSURE MAX PROTEIN PO LIQD
11.0000 [oz_av] | Freq: Two times a day (BID) | ORAL | Status: DC
  Administered 2020-08-12 – 2020-08-13 (×4): 11 [oz_av] via ORAL

## 2020-08-12 MED ORDER — VITAMIN D 25 MCG (1000 UNIT) PO TABS
1000.0000 [IU] | ORAL_TABLET | Freq: Every day | ORAL | Status: DC
  Administered 2020-08-12 – 2020-08-14 (×3): 1000 [IU] via ORAL
  Filled 2020-08-12 (×3): qty 1

## 2020-08-12 MED ORDER — METFORMIN HCL 500 MG PO TABS
250.0000 mg | ORAL_TABLET | Freq: Two times a day (BID) | ORAL | Status: DC
  Administered 2020-08-12: 500 mg via ORAL
  Administered 2020-08-12 – 2020-08-13 (×3): 250 mg via ORAL
  Filled 2020-08-12 (×5): qty 1

## 2020-08-12 NOTE — Progress Notes (Signed)
PROGRESS NOTE   Subjective/Complaints: No complaints Asked how often his dressing should be changed- discussed twice per day Slept well last night  ROS: denies insomnia   Objective:   No results found. Recent Labs    08/12/20 0459  WBC 8.1  HGB 12.2*  HCT 37.1*  PLT 264   Recent Labs    08/12/20 0459  NA 137  K 3.5  CL 98  CO2 28  GLUCOSE 106*  BUN 10  CREATININE 0.80  CALCIUM 8.8*    Intake/Output Summary (Last 24 hours) at 08/12/2020 1519 Last data filed at 08/12/2020 1450 Gross per 24 hour  Intake 480 ml  Output 5225 ml  Net -4745 ml        Physical Exam: Vital Signs Blood pressure 118/62, pulse 71, temperature 98.4 F (36.9 C), temperature source Oral, resp. rate 20, height '5\' 11"'$  (1.803 m), weight (!) 150.8 kg, SpO2 97 %. Gen: no distress, normal appearing HEENT: oral mucosa pink and moist, NCAT Cardio: Reg rate Chest: normal effort, normal rate of breathing Abd: soft, non-distended Ext: no edema Psych: pleasant, normal affect Skin:    Comments: Chronic vascular changes in bilateral legs. Calcified plaques on the dorsum of right foot, toes as well as right ankle. Left TMA dressed  Neurological:    Comments: Alert and oriented x 3. Normal insight and awareness. Intact Memory. Normal language and speech. Cranial nerve exam unremarkable. UE 5/5. Able to lift left leg off bed and move ankle. RLE 3-4/5 prox to distal. Stocking glove sensory loss below the ankles bilaterally. DTR's 1+  Psychiatric:        Mood and Affect: Mood normal.        Behavior: Behavior normal.        Thought Content: Thought content normal.    Assessment/Plan: 1. Functional deficits which require 3+ hours per day of interdisciplinary therapy in a comprehensive inpatient rehab setting. Physiatrist is providing close team supervision and 24 hour management of active medical problems listed below. Physiatrist and rehab team  continue to assess barriers to discharge/monitor patient progress toward functional and medical goals  Care Tool:  Bathing    Body parts bathed by patient: Right arm, Left arm, Chest, Abdomen, Front perineal area, Right upper leg, Left upper leg, Face   Body parts bathed by helper: Buttocks, Right lower leg, Left lower leg     Bathing assist Assist Level: Moderate Assistance - Patient 50 - 74%     Upper Body Dressing/Undressing Upper body dressing   What is the patient wearing?: Pull over shirt    Upper body assist Assist Level: Set up assist    Lower Body Dressing/Undressing Lower body dressing      What is the patient wearing?: Underwear/pull up, Pants     Lower body assist Assist for lower body dressing: Maximal Assistance - Patient 25 - 49%     Toileting Toileting    Toileting assist Assist for toileting: Maximal Assistance - Patient 25 - 49% Assistive Device Comment: urinal   Transfers Chair/bed transfer  Transfers assist     Chair/bed transfer assist level: Moderate Assistance - Patient 50 - 74%  Locomotion Ambulation   Ambulation assist      Assist level: Contact Guard/Touching assist Assistive device: Walker-rolling Max distance: 2 ft   Walk 10 feet activity   Assist  Walk 10 feet activity did not occur: Safety/medical concerns        Walk 50 feet activity   Assist Walk 50 feet with 2 turns activity did not occur: Safety/medical concerns         Walk 150 feet activity   Assist Walk 150 feet activity did not occur: Safety/medical concerns         Walk 10 feet on uneven surface  activity   Assist Walk 10 feet on uneven surfaces activity did not occur: Safety/medical concerns         Wheelchair     Assist   Type of Wheelchair: Manual    Wheelchair assist level: Supervision/Verbal cueing Max wheelchair distance: 100 ft    Wheelchair 50 feet with 2 turns activity    Assist        Assist Level:  Supervision/Verbal cueing   Wheelchair 150 feet activity     Assist      Assist Level: Minimal Assistance - Patient > 75%   Blood pressure 118/62, pulse 71, temperature 98.4 F (36.9 C), temperature source Oral, resp. rate 20, height '5\' 11"'$  (1.803 m), weight (!) 150.8 kg, SpO2 97 %.  Medical Problem List and Plan: 1.  Functional and mobility deficits secondary to gangrene/osteomyelitis of left foot which required a TMA             -patient may shower if left foot is covered             -ELOS/Goals: 12-14 days, supervision goals with PT and OT             -pt is [facility]B with [facility] 2.  Antithrombotics: -DVT/anticoagulation:  Pharmaceutical: Other (comment) Eliquis             -antiplatelet therapy: N/A 3. Postsurgical pain: continue Oxycodone prn 4. Mood: LCSW to follow for evaluation and support.              -antipsychotic agents: N/A 5. Neuropsych: This patient is capable of making decisions on his own behalf. 6. Skin/Wound Care: Routine pressure relief             -redress wound tomorrow 7. Fluids/Electrolytes/Nutrition: Monitor I/O. Check CMET in am. 8. T2DM: Hgb A1c-6.4.  Continue Levemir with sliding scale insulin. Excellent control: d/c ISS. Contineu to check CBGs AC/HS so we know if we need to add oral medication or increase insulin.  9. Left foot infection: TO continue doxycycline and Augmentin-->end date 08/14.             --continue vitamin C and Zinc. Continue Juven to promote wound healing.   10. Chronic systolic CHF: Monitor for signs of overload.  Check weights daily.   --Continue Cozaar, coreg, Lipitor and Demadex. 11. CAF: Monitor HR TID--on Eliquis and Coreg for rate control.   LOS: 1 days A FACE TO FACE EVALUATION WAS PERFORMED  Clide Deutscher Ahilyn Nell 08/12/2020, 3:19 PM

## 2020-08-12 NOTE — Plan of Care (Signed)
  Problem: RH Balance Goal: LTG Patient will maintain dynamic standing with ADLs (OT) Description: LTG:  Patient will maintain dynamic standing balance with assist during activities of daily living (OT)  Flowsheets (Taken 08/12/2020 1451) LTG: Pt will maintain dynamic standing balance during ADLs with: Independent with assistive device   Problem: Sit to Stand Goal: LTG:  Patient will perform sit to stand in prep for activites of daily living with assistance level (OT) Description: LTG:  Patient will perform sit to stand in prep for activites of daily living with assistance level (OT) Flowsheets (Taken 08/12/2020 1451) LTG: PT will perform sit to stand in prep for activites of daily living with assistance level: Independent with assistive device   Problem: RH Grooming Goal: LTG Patient will perform grooming w/assist,cues/equip (OT) Description: LTG: Patient will perform grooming with assist, with/without cues using equipment (OT) Flowsheets (Taken 08/12/2020 1451) LTG: Pt will perform grooming with assistance level of: Set up assist    Problem: RH Bathing Goal: LTG Patient will bathe all body parts with assist levels (OT) Description: LTG: Patient will bathe all body parts with assist levels (OT) Flowsheets (Taken 08/12/2020 1451) LTG: Pt will perform bathing with assistance level/cueing: Supervision/Verbal cueing   Problem: RH Dressing Goal: LTG Patient will perform upper body dressing (OT) Description: LTG Patient will perform upper body dressing with assist, with/without cues (OT). Flowsheets (Taken 08/12/2020 1451) LTG: Pt will perform upper body dressing with assistance level of: Set up assist Goal: LTG Patient will perform lower body dressing w/assist (OT) Description: LTG: Patient will perform lower body dressing with assist, with/without cues in positioning using equipment (OT) Flowsheets (Taken 08/12/2020 1451) LTG: Pt will perform lower body dressing with assistance level of:  Supervision/Verbal cueing   Problem: RH Toileting Goal: LTG Patient will perform toileting task (3/3 steps) with assistance level (OT) Description: LTG: Patient will perform toileting task (3/3 steps) with assistance level (OT)  Flowsheets (Taken 08/12/2020 1451) LTG: Pt will perform toileting task (3/3 steps) with assistance level: Independent with assistive device   Problem: RH Toilet Transfers Goal: LTG Patient will perform toilet transfers w/assist (OT) Description: LTG: Patient will perform toilet transfers with assist, with/without cues using equipment (OT) Flowsheets (Taken 08/12/2020 1451) LTG: Pt will perform toilet transfers with assistance level of: Independent with assistive device   Problem: RH Tub/Shower Transfers Goal: LTG Patient will perform tub/shower transfers w/assist (OT) Description: LTG: Patient will perform tub/shower transfers with assist, with/without cues using equipment (OT) Flowsheets (Taken 08/12/2020 1451) LTG: Pt will perform tub/shower stall transfers with assistance level of: Supervision/Verbal cueing

## 2020-08-12 NOTE — Progress Notes (Signed)
Received secure chat message bout Dr. Carrolyn Meiers dressing changes. Looking at the pictures there is a central area that looks like the skin is sloughing off. Recommend betaine to the incision and cover with dry dressing. Dr. March Rummage who is on call is going to come see the patient. Also after review of arterial studies would recommend vascular consult. Dr. Cannon Kettle mentioned outpatient follow up but since the patient is in rehab would recommend them evaluating the patient, especially if there is concern about the wound healing.   Celesta Gentile, DPM

## 2020-08-12 NOTE — Evaluation (Signed)
Occupational Therapy Assessment and Plan  Patient Details  Name: [name] MRN: [id number] Date of Birth: May 14, 1955  OT Diagnosis: abnormal posture, muscle weakness (generalized), and L transmetatarsal amputation Rehab Potential:   ELOS: 10-12 days   Today's Date: 08/12/2020 OT Individual Time: 0725-0825 OT Individual Time Calculation (min): 60 min     Hospital Problem: Principal Problem:   S/P transmetatarsal amputation of foot, right ([place])   Past Medical History:  Past Medical History:  Diagnosis Date   Acute systolic HF (heart failure) ([place])    Arthritis    lt foot, hips knees    Atrial fibrillation ([place])    Diabetes mellitus, new onset ([place])    Diverticulosis    Dyslipidemia    Gout attack 01/16/2012   Hypertension    Internal hemorrhoids    Lymphoma ([place])    remission for about 2 years, chemo 3 years prior   Pneumonia due to COVID-19 virus 07/2018   Pulmonary hypertension ([place])    Sepsis ([place]) 02/2017   secondary to influenza; requiring trach   Tubular adenoma of colon    Venous stasis ulcers ([place])    Past Surgical History:  Past Surgical History:  Procedure Laterality Date   AMPUTATION TOE Left 05/08/2019   Procedure: AMPUTATION LEFT GREAT  TOE;  Surgeon: Trula Slade, DPM;  [facility];  Service: Podiatry;  Laterality: Left;   BRONCHOSCOPY     ESOPHAGOGASTRODUODENOSCOPY ENDOSCOPY     Dr Royden Purl HERNIA REPAIR Left    2001   IR REMOVAL TUN ACCESS W/ PORT W/O FL MOD SED  02/03/2020   PORTA CATH INSERTION  2011   TRACHEOSTOMY  03/2017   with decannulation   TRANSMETATARSAL AMPUTATION Left 08/05/2020   Procedure: TRANSMETATARSAL AMPUTATION;  Surgeon: Trula Slade, DPM;  Location: [facility];  Service: Podiatry;  Laterality: Left;    Assessment & Plan Clinical Impression: [name] is a 65 year old male with history of follicular lymphoma, morbid obesity-BMI 31, A fib, recent ED visits for CHF exacerbation, left great toe amputation  who was admitted on 08/04/20 with acute osteomyelitis of left 2nd and 3rd toe and gangrenous changes of left 2nd toe. He was started on IV vanc and underwent left transmetatarsal ampuation by Dr. Jacqualyn Posey on 07/26. Post op to be NWB with CAM boot and IV antibiotids transitioned to Augmentin/doxycycline for 2 weeks. PT/OT consulted and patient  noted to have difficulty maintaining NWB with standing, fatigue and limited mobility. CIR recommended due to functional decline. Patient transferred to CIR on 08/11/2020 .    Patient currently requires mod with basic self-care skills secondary to muscle weakness, decreased cardiorespiratoy endurance, decreased coordination and decreased motor planning, and decreased sitting balance, decreased standing balance, decreased postural control, decreased balance strategies, and difficulty maintaining precautions.  Prior to hospitalization, patient could complete BADL and IADL with modified independent .  Patient will benefit from skilled intervention to increase independence with basic self-care skills prior to discharge home with care partner.  Anticipate patient will require intermittent supervision and follow up home health.  OT - End of Session Activity Tolerance: Tolerates 30+ min activity with multiple rests Endurance Deficit: Yes OT Assessment OT Barriers to Discharge: Home environment access/layout OT Patient demonstrates impairments in the following area(s): Balance;Edema;Endurance;Motor;Safety;Skin Integrity OT Basic ADL's Functional Problem(s): Bathing;Grooming;Dressing;Toileting OT Transfers Functional Problem(s): Toilet;Tub/Shower OT Additional Impairment(s): None OT Plan OT Intensity: Minimum of 1-2 x/day, 45 to 90 minutes OT Frequency: 5 out of 7  days OT Duration/Estimated Length of Stay: 10-12 days OT Treatment/Interventions: Balance/vestibular training;Discharge planning;Pain management;Self Care/advanced ADL retraining;Therapeutic Activities;UE/LE  Coordination activities;Visual/perceptual remediation/compensation;Therapeutic Exercise;Skin care/wound managment;Patient/family education;Functional mobility training;Disease mangement/prevention;Cognitive remediation/compensation;Community reintegration;DME/adaptive equipment instruction;Neuromuscular re-education;Psychosocial support;UE/LE Strength taining/ROM;Wheelchair propulsion/positioning;Splinting/orthotics OT Self Feeding Anticipated Outcome(s): no goal OT Basic Self-Care Anticipated Outcome(s): Supervision overall, Mod I toilet OT Toileting Anticipated Outcome(s): Mod I OT Bathroom Transfers Anticipated Outcome(s): Mod I OT Recommendation Patient destination: Home Follow Up Recommendations: None Equipment Recommended: To be determined   OT Evaluation Precautions/Restrictions  Precautions Precautions: None Precaution Comments: venous stasis, NWB LLE w/ DARCO Required Braces or Orthoses: Other Brace Other Brace: L darco shoe Restrictions Weight Bearing Restrictions: Yes LLE Weight Bearing: Non weight bearing Other Position/Activity Restrictions: Pt has difficulty maintaining weight bearing in standing and fatigues quickly. Pain Pain Assessment Pain Scale: 0-10 Pain Score: 0-No pain Home Living/Prior Functioning Home Living Family/patient expects to be discharged to:: Private residence Living Arrangements: Alone Available Help at Discharge: Friend(s), Available PRN/intermittently, Family (family/friends staying short term at time of DC) Type of Home: House Home Access: Stairs to enter Technical brewer of Steps: 1 Home Layout: Bed/bath upstairs, Able to live on main level with bedroom/bathroom Bathroom Shower/Tub: Tub/shower unit, Multimedia programmer: Standard  Lives With: Alone IADL History 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 Prior Function Level of Independence: Requires assistive device for  independence, Independent with homemaking with ambulation, Independent with basic ADLs, Independent with gait, Independent with transfers  Able to Take Stairs?: Yes Driving: Yes Vocation: Full time employment Vocation Requirements: Sits on 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[employer], working as Associate Professor Baseline Vision/History: No visual deficits (wears contacts) Patient Visual Report: No change from baseline Perception  Perception: Within Functional Limits Praxis Praxis: Intact Cognition Overall Cognitive Status: Within Functional Limits for tasks assessed Arousal/Alertness: Awake/alert Orientation Level: Person;Place;Situation Person: Oriented Place: Oriented Situation: Oriented Year: 2022 Month: August Day of Week: Correct Memory: Appears intact Immediate Memory Recall: Sock;Blue;Bed Memory Recall Sock: Without Cue Memory Recall Blue: Without Cue Memory Recall Bed: Not able to recall Awareness: Appears intact Problem Solving: Appears intact Safety/Judgment: Appears intact Sensation Sensation Light Touch: Appears Intact Proprioception: Appears Intact Stereognosis: Appears Intact Coordination Gross Motor Movements are Fluid and Coordinated: No Fine Motor Movements are Fluid and Coordinated: Yes Coordination and Movement Description: grossly uncoordinated d/t NWB Motor  Motor Motor: Abnormal postural alignment and control Motor - Skilled Clinical Observations: grossly uncoordinated d/t NWB LLE  Trunk/Postural Assessment  Cervical Assessment Cervical Assessment: Within Functional Limits Thoracic Assessment Thoracic Assessment: Exceptions to [facility] Lumbar Assessment Lumbar Assessment: Exceptions to [facility] Postural Control Postural Control: Deficits on evaluation  Balance Balance Balance Assessed: Yes Dynamic Sitting Balance Dynamic Sitting - Balance Support: Feet supported Dynamic Sitting - Level of Assistance: 5: Stand by assistance Dynamic  Sitting - Balance Activities: Lateral lean/weight shifting;Forward lean/weight shifting;Reaching for objects Static Standing Balance Static Standing - Balance Support: During functional activity;Bilateral upper extremity supported Static Standing - Level of Assistance: 4: Min assist Dynamic Standing Balance Dynamic Standing - Balance Support: During functional activity;Bilateral upper extremity supported Dynamic Standing - Level of Assistance: 3: Mod assist Dynamic Standing - Balance Activities: Lateral lean/weight shifting;Forward lean/weight shifting;Reaching for objects Extremity/Trunk Assessment RUE Assessment RUE Assessment: Within Functional Limits General Strength Comments: strength WFL for ADL but deconditioned d/t hospitalization LUE Assessment LUE Assessment: Within Functional Limits General Strength Comments: strength WFL for ADL but deconditioned d/t hospitalization  Care Tool Care Tool  Self Care Eating    Set up    Oral Care    Oral Care Assist Level: Set up assist    Bathing   Body parts bathed by patient: Right arm;Left arm;Chest;Abdomen;Front perineal area;Right upper leg;Left upper leg;Face Body parts bathed by helper: Buttocks;Right lower leg;Left lower leg   Assist Level: Moderate Assistance - Patient 50 - 74%    Upper Body Dressing(including orthotics)   What is the patient wearing?: Pull over shirt   Assist Level: Set up assist    Lower Body Dressing (excluding footwear)   What is the patient wearing?: Underwear/pull up;Pants Assist for lower body dressing: Maximal Assistance - Patient 25 - 49%    Putting on/Taking off footwear   What is the patient wearing?: Non-skid slipper socks;Orthosis [facility]) Assist for footwear: Dependent - Patient 0%       Care Tool Toileting Toileting activity   Assist for toileting: Maximal Assistance - Patient 25 - 49%     Care Tool Bed Mobility Roll left and right activity   Roll left and right assist level:  Independent with assistive device Roll left and right assistive device comment: bedrail  Sit to lying activity   Sit to lying assist level: Contact Guard/Touching assist    Lying to sitting edge of bed activity   Lying to sitting edge of bed assist level: Supervision/Verbal cueing     Care Tool Transfers Sit to stand transfer   Sit to stand assist level: Moderate Assistance - Patient 50 - 74%    Chair/bed transfer   Chair/bed transfer assist level: Moderate Assistance - Patient 50 - 74%     Toilet transfer   Assist Level: Moderate Assistance - Patient 50 - 74%     Care Tool Cognition Expression of Ideas and Wants Expression of Ideas and Wants: Without difficulty (complex and basic) - expresses complex messages without difficulty and with speech that is clear and easy to understand   Understanding Verbal and Non-Verbal Content Understanding Verbal and Non-Verbal Content: Understands (complex and basic) - clear comprehension without cues or repetitions   Memory/Recall Ability *first 3 days only Memory/Recall Ability *first 3 days only: Current season;Location of own room;Staff names and faces;That he or she is in a hospital/hospital unit    Refer to Care Plan for [place] 1 OT Short Term Goal 1 (Week 1): Pt will complete BSC/toilet transfer with LRAD and good precaution adherence with Min A OT Short Term Goal 2 (Week 1): Pt will perform LB dress w/ AE PRN with Min A OT Short Term Goal 3 (Week 1): Pt will perform bathing tasks with no more than Min A with AE PRN OT Short Term Goal 4 (Week 1): Pt will perform sit <> stands in prep for ADL with Min A  Recommendations for other services: None    Skilled Therapeutic Intervention ADL ADL Eating: Not assessed Grooming: Setup Upper Body Bathing: Setup Lower Body Bathing: Moderate assistance Upper Body Dressing: Setup Lower Body Dressing: Maximal assistance Toileting: Moderate assistance Toilet  Transfer: Moderate assistance Toilet Transfer Method: Stand pivot Toilet Transfer Equipment: Extra wide drop arm bedside commode Mobility  Bed Mobility Bed Mobility: Left Sidelying to Sit Left Sidelying to Sit: Contact Guard/Touching assist Transfers Sit to Stand: Maximal Assistance - Patient 25-49% Stand to Sit: Moderate Assistance - Patient 50-74%  Skilled Intervention: Pt greeted at time of session resting bed level agreeable to OT session no pain throughout. Pt able to verbally  state precautions/WB restrictions but difficulty adhering at times in standing for LLE. Reviewed role and purpose of OT and pt agreeable. See above and below for details. Overall pt is Mod/Max for power up into stance at RW, Mod A for stand pivot transfers to [facility], shower, bed, etc. And does need assist to maintain WB status. Pt is Max A for LB dressing and Min/CGA for UB ADLs. Pt left on BSC with NT in room d/t time limitations at end of session.    Discharge Criteria: Patient will be discharged from OT if patient refuses treatment 3 consecutive times without medical reason, if treatment goals not met, if there is a change in medical status, if patient makes no progress towards goals or if patient is discharged from hospital.  The above assessment, treatment plan, treatment alternatives and goals were discussed and mutually agreed upon: by patient  Viona Gilmore 08/12/2020, 1:00 PM

## 2020-08-12 NOTE — Progress Notes (Signed)
Inpatient Rehabilitation Care Coordinator Assessment and Plan Patient Details  Name: [name] MRN: [id number] Date of Birth: [birth year]  Today's Date: 08/12/2020  Hospital Problems: Principal Problem:   S/P transmetatarsal amputation of foot, right [facility])  Past Medical History:  Past Medical History:  Diagnosis Date   Acute systolic HF (heart failure) ([place])    Arthritis    lt foot, hips knees    Atrial fibrillation ([place])    Diabetes mellitus, new onset ([place])    Diverticulosis    Dyslipidemia    Gout attack 01/16/2012   Hypertension    Internal hemorrhoids    Lymphoma ([place])    remission for about 2 years, chemo 3 years prior   Pneumonia due to COVID-19 virus 07/2018   Pulmonary hypertension ([place])    Sepsis ([place]) 02/2017   secondary to influenza; requiring trach   Tubular adenoma of colon    Venous stasis ulcers ([place])    Past Surgical History:  Past Surgical History:  Procedure Laterality Date   AMPUTATION TOE Left 05/08/2019   Procedure: AMPUTATION LEFT GREAT  TOE;  Surgeon: Trula Slade, DPM;  [facility];  Service: Podiatry;  Laterality: Left;   BRONCHOSCOPY     ESOPHAGOGASTRODUODENOSCOPY ENDOSCOPY     Dr Royden Purl HERNIA REPAIR Left    2001   IR REMOVAL TUN ACCESS W/ PORT W/O FL MOD SED  02/03/2020   PORTA CATH INSERTION  2011   TRACHEOSTOMY  03/2017   with decannulation   TRANSMETATARSAL AMPUTATION Left 08/05/2020   Procedure: TRANSMETATARSAL AMPUTATION;  Surgeon: Trula Slade, DPM;  Location: [place];  Service: Podiatry;  Laterality: Left;   Social History:  reports that he quit smoking about 11 years ago. His smoking use included cigarettes. He has a 10.00 pack-year smoking history. He has never used smokeless tobacco. He reports previous alcohol use. He reports that he does not use drugs.  Family / Support Systems Marital Status: Single Other Supports: Programmer, systems (friend), Renee (Sister) Anticipated Caregiver: Mariea Stable Ability/Limitations of Caregiver: Min A Caregiver Availability: 24/7 Family Dynamics: Not much family. Some Support from friend and sister  Social History Preferred language: English Religion: Baptist Read: Yes Write: Yes Employment Status: Employed Public relations account executive Issues: n/a Guardian/Conservator: n/a   Abuse/Neglect Abuse/Neglect Assessment Can Be Completed: Yes Physical Abuse: Denies Verbal Abuse: Denies Sexual Abuse: Denies Exploitation of patient/patient's resources: Denies Self-Neglect: Denies  Emotional Status Recent Psychosocial Issues: n/a Psychiatric History: n/a Substance Abuse History: n/a  Patient / Family Perceptions, Expectations & Goals Pt/Family understanding of illness & functional limitations: yes Premorbid pt/family roles/activities: Patient independent, active and working in the community Anticipated changes in roles/activities/participation: friend will be able to assist with roles and task Pt/family expectations/goals: [place]: None Premorbid Home Care/DME Agencies: Other (Comment) Librarian, academic, [place]) Transportation available at discharge: Family able to transport Resource referrals recommended: Neuropsychology  Discharge Planning Living Arrangements: Alone Support Systems: Water engineer, Other relatives Type of Residence: Private residence Insurance Resources: Multimedia programmer (specify) (BCBS Medicare) Museum/gallery curator Resources: Employment Museum/gallery curator Screen Referred: No Living Expenses: Medical laboratory scientific officer Management: Patient Does the patient have any problems obtaining your medications?: No Home Management: Independent Patient/Family Preliminary Plans: anticipating to manage Care Coordinator Barriers to Discharge: Insurance for SNF coverage, IV antibiotics, Wound Care Care Coordinator Anticipated Follow Up Needs: HH/OP Expected length of stay: 12-14 Days  Clinical  Impression SW made attempt to see patient to assess. Patient with therapy, sw  will follow up.   Dyanne Iha 08/12/2020, 3:15 PM

## 2020-08-12 NOTE — Progress Notes (Signed)
Center Individual Statement of Services  Patient Name:  [name]  Date:  08/12/2020  Welcome to the [facility].  Our goal is to provide you with an individualized program based on your diagnosis and situation, designed to meet your specific needs.  With this comprehensive rehabilitation program, you will be expected to participate in at least 3 hours of rehabilitation therapies Monday-Friday, with modified therapy programming on the weekends.  Your rehabilitation program will include the following services:  Physical Therapy (PT), Occupational Therapy (OT), Speech Therapy (ST), 24 hour per day rehabilitation nursing, Therapeutic Recreaction (TR), Neuropsychology, Care Coordinator, Rehabilitation Medicine, Nutrition Services, Pharmacy Services, and Other  Weekly team conferences will be held on Tuesdays to discuss your progress.  Your Inpatient Rehabilitation Care Coordinator will talk with you frequently to get your input and to update you on team discussions.  Team conferences with you and your family in attendance may also be held.  Expected length of stay:  12-14 Days  Overall anticipated outcome:  Supervision  Depending on your progress and recovery, your program may change. Your Inpatient Rehabilitation Care Coordinator will coordinate services and will keep you informed of any changes. Your Inpatient Rehabilitation Care Coordinator's name and contact numbers are listed  below.  The following services may also be recommended but are not provided by the [facility]:   [place] will be made to provide these services after discharge if needed.  Arrangements include referral to agencies that provide these services.  Your insurance has been verified to be:  [name] Your primary doctor is:  [name], MD   Pertinent information will be  shared with your doctor and your insurance company.  Inpatient Rehabilitation Care Coordinator:  Erlene Quan, [place] or [phone]  Information discussed with and copy given to patient by: Dyanne Iha, 08/12/2020, 3:02 PM

## 2020-08-12 NOTE — Evaluation (Signed)
Physical Therapy Assessment and Plan  Patient Details  Name: [name] MRN: [id number] Date of Birth: 01-Jul-1955  PT Diagnosis: Abnormal posture, Abnormality of gait, Coordination disorder, Difficulty walking, and Muscle weakness Rehab Potential: Good ELOS: 10-12   Today's Date: 08/12/2020 PT Individual Time: 5027-7412, 0945-1100 PT Individual Time Calculation (min): 60 min, 75 min  Hospital Problem: Principal Problem:   S/P transmetatarsal amputation of foot, right ([place])   Past Medical History:  Past Medical History:  Diagnosis Date   Acute systolic HF (heart failure) ([place])    Arthritis    lt foot, hips knees    Atrial fibrillation ([place])    Diabetes mellitus, new onset ([place])    Diverticulosis    Dyslipidemia    Gout attack 01/16/2012   Hypertension    Internal hemorrhoids    Lymphoma ([place])    remission for about 2 years, chemo 3 years prior   Pneumonia due to COVID-19 virus 07/2018   Pulmonary hypertension ([place])    Sepsis ([place]) 02/2017   secondary to influenza; requiring trach   Tubular adenoma of colon    Venous stasis ulcers ([place])    Past Surgical History:  Past Surgical History:  Procedure Laterality Date   AMPUTATION TOE Left 05/08/2019   Procedure: AMPUTATION LEFT GREAT  TOE;  Surgeon: Trula Slade, DPM;  Location: [facility];  Service: Podiatry;  Laterality: Left;   BRONCHOSCOPY     ESOPHAGOGASTRODUODENOSCOPY ENDOSCOPY     Dr Royden Purl HERNIA REPAIR Left    2001   IR REMOVAL TUN ACCESS W/ PORT W/O FL MOD SED  02/03/2020   PORTA CATH INSERTION  2011   TRACHEOSTOMY  03/2017   with decannulation   TRANSMETATARSAL AMPUTATION Left 08/05/2020   Procedure: TRANSMETATARSAL AMPUTATION;  Surgeon: Trula Slade, DPM;  Location: [facility];  Service: Podiatry;  Laterality: Left;    Assessment & Plan Clinical Impression:  [name] is a 65 year old male with history of follicular lymphoma, morbid obesity-BMI 4, A fib, recent ED visits for CHF  exacerbation, left great toe amputation who was admitted on 08/04/20 with acute osteomyelitis of left 2nd and 3rd toe and gangrenous changes of left 2nd toe. He was started on IV vanc and underwent left transmetatarsal ampuation by Dr. Jacqualyn Posey on 07/26. Post op to be NWB with CAM boot and IV antibiotids transitioned to Augmentin/doxycycline for 2 weeks. PT/OT consulted and patient  noted to have difficulty maintaining NWB with standing, fatigue and limited mobility. Patient transferred to CIR on 08/11/2020 .   Patient currently requires mod with mobility secondary to muscle weakness, decreased cardiorespiratoy endurance, and decreased standing balance and difficulty maintaining precautions.  Prior to hospitalization, patient was independent  with mobility and lived with Alone in a House home.  Home access is 1Stairs to enter.  Patient will benefit from skilled PT intervention to maximize safe functional mobility, minimize fall risk, and decrease caregiver burden for planned discharge home with intermittent assist.  Anticipate patient will benefit from follow up [facility] at discharge.  PT - End of Session Activity Tolerance: Tolerates < 10 min activity, no significant change in vital signs Endurance Deficit: Yes PT Assessment Rehab Potential (ACUTE/IP ONLY): Good PT Barriers to Discharge: [place] home environment;Decreased caregiver support;Home environment access/layout;Wound Care;Weight;Weight bearing restrictions PT Patient demonstrates impairments in the following area(s): Balance;Endurance;Motor;Skin Integrity PT Transfers Functional Problem(s): Bed Mobility;Bed to Chair;Car;Furniture PT Locomotion Functional Problem(s): Ambulation;Wheelchair Mobility PT Plan PT Intensity: Minimum of 1-2 x/day ,45  to 90 minutes PT Frequency: 5 out of 7 days PT Duration Estimated Length of Stay: 10-12 PT Treatment/Interventions: Ambulation/gait training;Balance/vestibular training;Community reintegration;Discharge  planning;Disease management/prevention;DME/adaptive equipment instruction;Functional mobility training;Neuromuscular re-education;Pain management;Patient/family education;Psychosocial support;Stair training;Therapeutic Activities;Therapeutic Exercise;UE/LE Strength taining/ROM;UE/LE Coordination activities;Wheelchair propulsion/positioning PT Transfers Anticipated Outcome(s): mod I PT Locomotion Anticipated Outcome(s): supervision short distance gait, mod I w/c PT Recommendation Follow Up Recommendations: Home health PT Patient destination: Home Equipment Recommended: Other (comment) (bariatric RW)   PT Evaluation Precautions/Restrictions Precautions Precautions: Fall Precaution Comments: venous stasis, NWB LLE w/ DARCO Required Braces or Orthoses: Other Brace Other Brace: L darco shoe Restrictions Weight Bearing Restrictions: Yes LLE Weight Bearing: Non weight bearing Other Position/Activity Restrictions: Pt has difficulty maintaining weight bearing in standing and fatigues quickly. General   Vital SignsTherapy Vitals Temp: 98.4 F (36.9 C) Temp Source: Oral Pulse Rate: 71 Resp: 20 BP: 118/62 Patient Position (if appropriate): Lying Oxygen Therapy SpO2: 97 % O2 Device: Room Air Pain Pain Assessment Pain Scale: 0-10 Pain Score: 0-No pain Home Living/Prior Functioning Home Living Living Arrangements: Alone Available Help at Discharge: Friend(s);Available PRN/intermittently;Family Type of Home: House Home Access: Stairs to enter [employer] of Steps: 1 Entrance Stairs-Rails: None Home Layout: Bed/bath upstairs;Able to live on main level with bedroom/bathroom Bathroom Shower/Tub: Tub/shower unit;Walk-in shower Bathroom Toilet: Standard  Lives With: Alone Prior Function Level of Independence: Requires assistive device for independence  Able to Take Stairs?: Yes Driving: Yes Vocation: Full time employment Vocation Requirements: Sits on stool while working in  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[employer], working as Theme park manager Vision/Perception  Geologist, engineering: 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 Overall Cognitive Status: Within Functional Limits for tasks assessed Arousal/Alertness: Awake/alert Orientation Level: Oriented X4 Memory: Appears intact Immediate Memory Recall: Sock;Blue;Bed Memory Recall Sock: Without Cue Memory Recall Blue: Without Cue Memory Recall Bed: Not able to recall Awareness: Appears intact Problem Solving: Appears intact Safety/Judgment: Appears intact Sensation Sensation Light Touch: Appears Intact Hot/Cold: Appears Intact Proprioception: Appears Intact Stereognosis: Appears Intact Coordination Gross Motor Movements are Fluid and Coordinated: No Fine Motor Movements are Fluid and Coordinated: Yes Coordination and Movement Description: grossly uncoordinated d/t NWB Motor  Motor Motor: Abnormal postural alignment and control Motor - Skilled Clinical Observations: grossly uncoordinated d/t NWB LLE   Trunk/Postural Assessment  Cervical Assessment Cervical Assessment: Within Functional Limits Thoracic Assessment Thoracic Assessment: Within Functional Limits Lumbar Assessment Lumbar Assessment: Within Functional Limits Postural Control Postural Control: Deficits on evaluation  Balance Balance Balance Assessed: Yes Dynamic Sitting Balance Dynamic Sitting - Balance Support: Feet supported Dynamic Sitting - Level of Assistance: 5: Stand by assistance Dynamic Sitting - Balance Activities: Lateral lean/weight shifting;Forward lean/weight shifting;Reaching for objects Static Standing Balance Static Standing - Balance Support: During functional activity;Bilateral upper extremity supported Static Standing - Level of Assistance: 4: Min assist Dynamic Standing Balance Dynamic Standing - Balance Support: During functional activity;Bilateral upper extremity supported Dynamic Standing - Level of  Assistance: 3: Mod assist Dynamic Standing - Balance Activities: Lateral lean/weight shifting;Forward lean/weight shifting;Reaching for objects Extremity Assessment  RUE Assessment RUE Assessment: Within Functional Limits General Strength Comments: strength WFL for ADL but deconditioned d/t hospitalization LUE Assessment LUE Assessment: Within Functional Limits General Strength Comments: strength WFL for ADL but deconditioned d/t hospitalization RLE Assessment RLE Assessment: Exceptions to [facility] General Strength Comments: 4-/5 LLE Assessment LLE Assessment: Exceptions to [facility] General Strength Comments: 4-/5 grossly, 3/5 at ankle  Care Tool Care Tool Bed Mobility Roll left and right activity   Roll left and right assist  level: Independent with assistive device Roll left and right assistive device comment: bedrail  Sit to lying activity   Sit to lying assist level: Contact Guard/Touching assist    Lying to sitting edge of bed activity   Lying to sitting edge of bed assist level: Supervision/Verbal cueing     Care Tool Transfers Sit to stand transfer   Sit to stand assist level: Moderate Assistance - Patient 50 - 74%    Chair/bed transfer   Chair/bed transfer assist level: Moderate Assistance - Patient 50 - 74%     Toilet transfer   Assist Level: Moderate Assistance - Patient 50 - 74%    Car transfer   Car transfer assist level: Supervision/Verbal cueing      Care Tool Locomotion Ambulation   Assist level: Contact Guard/Touching assist Assistive device: Walker-rolling Max distance: 12 ft  Walk 10 feet activity Walk 10 feet activity did not occur: Safety/medical concerns Assist level: Moderate Assistance - Patient - 50 - 74% Assistive device: Walker-Eva   Walk 50 feet with 2 turns activity Walk 50 feet with 2 turns activity did not occur: Safety/medical concerns      Walk 150 feet activity Walk 150 feet activity did not occur: Safety/medical concerns      Walk 10  feet on uneven surfaces activity Walk 10 feet on uneven surfaces activity did not occur: Safety/medical concerns      Stairs Stair activity did not occur: Safety/medical concerns        Walk up/down 1 step activity Walk up/down 1 step or curb (drop down) activity did not occur: Safety/medical concerns     Walk up/down 4 steps activity did not occuR: Safety/medical concerns  Walk up/down 4 steps activity      Walk up/down 12 steps activity Walk up/down 12 steps activity did not occur: Safety/medical concerns      Pick up small objects from floor Pick up small object from the floor (from standing position) activity did not occur: Safety/medical concerns      Wheelchair   Type of Wheelchair: Manual   Wheelchair assist level: Supervision/Verbal cueing Max wheelchair distance: 100 ft  Wheel 50 feet with 2 turns activity   Assist Level: Supervision/Verbal cueing  Wheel 150 feet activity   Assist Level: Minimal Assistance - Patient > 75%    Refer to Care Plan for Long Term Goals  SHORT TERM GOAL WEEK 1 PT Short Term Goal 1 (Week 1): Pt will be able to maintain WB precautions with gait up to 20 ft. PT Short Term Goal 2 (Week 1): Pt will initiate stair training. PT Short Term Goal 3 (Week 1): Pt will tolerate up to 30 min of activity.  Recommendations for other services: None   Skilled Therapeutic Intervention Mobility Bed Mobility Bed Mobility: Supine to Sit;Sit to Supine Left Sidelying to Sit: Contact Guard/Touching assist Supine to Sit: Contact Guard/Touching assist Sit to Supine: Supervision/Verbal cueing Transfers Transfers: Sit to Stand;Stand to Sit;Stand Pivot Transfers Sit to Stand: Maximal Assistance - Patient 25-49%;Moderate Assistance - Patient 50-74% Stand to Sit: Minimal Assistance - Patient > 75% Stand Pivot Transfers: Moderate Assistance - Patient 50 - 74% Stand Pivot Transfer Details: Verbal cues for technique;Verbal cues for safe use of DME/AE;Verbal cues  for precautions/safety;Verbal cues for sequencing Transfer (Assistive device): Rolling walker Locomotion  Gait Gait Distance (Feet): 12 Feet Assistive device: Eva walker Gait Gait Pattern: Impaired Gait velocity: decr Stairs / Additional Locomotion Stairs: No Wheelchair Mobility Wheelchair Mobility: Yes Wheelchair Assistance: Supervision/Verbal  cueing Wheelchair Propulsion: Both upper extremities Wheelchair Parts Management: Supervision/cueing Distance: 50 ft  Session 1: Evaluation completed (see details above and below) with education on PT POC and goals and individual treatment initiated with focus on initiating functional mobility. pt received in bed and agreeable to therapy. No complaint of pain. Bed mobility with CGA, Strength and sensation testing performed with pt seated EOB. Sit to stand with mod A from elevated surface, Stand pivot transfer with RW CGA pt with difficulty maintaining WB precautions. Pt propelled w/c with BUE 3x50 ft with short rest breaks. Car transfer with CGA. Pt unable to clear foot with RW 2/2 deconditioning. Pt returned to bed after session and was left with all needs in reach and alarm active.  Session 2: pt received in bed and agreeable to therapy. No complaint of pain. Session focused on trialling walkers to determine most appropriate AD device to use at this time. Supervision for bed mobility. Sit to stand and Stand pivot transfer with min-mod A throughout session. Pt has difficulty maintaining WB precautions on R foot 2/2 extreme deconditioning. Pt was unable to clear foot with RW. Attempted [place] walker 2/2 pt reporting he would feel better with incr arm support, pt still unable to clear foot while maintaining NWB. Retrieved bariatric RW for incr width and stability, pt reports it feels the most comfortable with transfers. Pt still with difficulty clearing foot. Performed pushups on RW for tricep strength to improve foot clearance. Pt propelled w/c with BUE x  ~50 ft before fatiguing, can continue after short break. Pt returned to room and returned to bed after session and was left with all needs in reach and alarm active.   Discharge Criteria: Patient will be discharged from PT if patient refuses treatment 3 consecutive times without medical reason, if treatment goals not met, if there is a change in medical status, if patient makes no progress towards goals or if patient is discharged from hospital.  The above assessment, treatment plan, treatment alternatives and goals were discussed and mutually agreed upon: by patient  Mickel Fuchs 08/12/2020, 4:31 PM

## 2020-08-12 NOTE — Progress Notes (Signed)
Staff administered '500mg'$  of Metformin, '250mg'$  ordered. PA Pam notified. No new orders received. No complications noted with pt. Pt resting in room, call light in reach.  Sheela Stack, LPN

## 2020-08-13 DIAGNOSIS — I739 Peripheral vascular disease, unspecified: Secondary | ICD-10-CM

## 2020-08-13 DIAGNOSIS — Z89431 Acquired absence of right foot: Secondary | ICD-10-CM | POA: Diagnosis not present

## 2020-08-13 LAB — GLUCOSE, CAPILLARY
Glucose-Capillary: 118 mg/dL — ABNORMAL HIGH (ref 70–99)
Glucose-Capillary: 110 mg/dL — ABNORMAL HIGH (ref 70–99)
Glucose-Capillary: 108 mg/dL — ABNORMAL HIGH (ref 70–99)
Glucose-Capillary: 107 mg/dL — ABNORMAL HIGH (ref 70–99)
Glucose-Capillary: 136 mg/dL — ABNORMAL HIGH (ref 70–99)

## 2020-08-13 MED ORDER — OXYCODONE-ACETAMINOPHEN 5-325 MG PO TABS: 1.0000 | ORAL_TABLET | ORAL | Status: DC | PRN

## 2020-08-13 NOTE — Progress Notes (Signed)
PROGRESS NOTE   Subjective/Complaints: No complaints this morning Sleeping well Dressing changes being done twice per day Therapy has been tough, but good!  ROS: denies insomnia, pain   Objective:   No results found. Recent Labs    08/12/20 0459  WBC 8.1  HGB 12.2*  HCT 37.1*  PLT 264   Recent Labs    08/12/20 0459  NA 137  K 3.5  CL 98  CO2 28  GLUCOSE 106*  BUN 10  CREATININE 0.80  CALCIUM 8.8*    Intake/Output Summary (Last 24 hours) at 08/13/2020 1229 Last data filed at 08/13/2020 [id number] Gross per 24 hour  Intake 1200 ml  Output 4925 ml  Net -3725 ml        Physical Exam: Vital Signs Blood pressure 107/67, pulse (!) 56, temperature 98 F (36.7 C), resp. rate 18, height '5\' 11"'$  (1.803 m), weight (!) 150.8 kg, SpO2 95 %. Gen: no distress, normal appearing HEENT: oral mucosa pink and moist, NCAT Cardio: Bradycardia Chest: normal effort, normal rate of breathing Abd: soft, non-distended Ext: no edema Psych: pleasant, normal affect Skin:    Comments: Chronic vascular changes in bilateral legs. Calcified plaques on the dorsum of right foot, toes as well as right ankle. Left TMA dressed  Neurological:    Comments: Alert and oriented x 3. Normal insight and awareness. Intact Memory. Normal language and speech. Cranial nerve exam unremarkable. UE 5/5. Able to lift left leg off bed and move ankle. RLE 3-4/5 prox to distal. Stocking glove sensory loss below the ankles bilaterally. DTR's 1+  Psychiatric:        Mood and Affect: Mood normal.        Behavior: Behavior normal.        Thought Content: Thought content normal.    Assessment/Plan: 1. Functional deficits which require 3+ hours per day of interdisciplinary therapy in a comprehensive inpatient rehab setting. Physiatrist is providing close team supervision and 24 hour management of active medical problems listed below. Physiatrist and rehab team  continue to assess barriers to discharge/monitor patient progress toward functional and medical goals  Care Tool:  Bathing    Body parts bathed by patient: Right arm, Left arm, Chest, Abdomen, Front perineal area, Right upper leg, Left upper leg, Face   Body parts bathed by helper: Buttocks, Right lower leg, Left lower leg     Bathing assist Assist Level: Moderate Assistance - Patient 50 - 74%     Upper Body Dressing/Undressing Upper body dressing   What is the patient wearing?: Pull over shirt    Upper body assist Assist Level: Set up assist    Lower Body Dressing/Undressing Lower body dressing      What is the patient wearing?: Underwear/pull up, Pants     Lower body assist Assist for lower body dressing: Maximal Assistance - Patient 25 - 49%     Toileting Toileting    Toileting assist Assist for toileting: Independent with assistive device Assistive Device Comment: Urinal   Transfers Chair/bed transfer  Transfers assist     Chair/bed transfer assist level: Moderate Assistance - Patient 50 - 74%     Locomotion Ambulation  Ambulation assist      Assist level: Contact Guard/Touching assist Assistive device: Walker-rolling Max distance: 12 ft   Walk 10 feet activity   Assist  Walk 10 feet activity did not occur: Safety/medical concerns  Assist level: Moderate Assistance - Patient - 50 - 74% Assistive device: Walker-Eva   Walk 50 feet activity   Assist Walk 50 feet with 2 turns activity did not occur: Safety/medical concerns         Walk 150 feet activity   Assist Walk 150 feet activity did not occur: Safety/medical concerns         Walk 10 feet on uneven surface  activity   Assist Walk 10 feet on uneven surfaces activity did not occur: Safety/medical concerns         Wheelchair     Assist   Type of Wheelchair: Manual    Wheelchair assist level: Supervision/Verbal cueing Max wheelchair distance: 100 ft     Wheelchair 50 feet with 2 turns activity    Assist        Assist Level: Supervision/Verbal cueing   Wheelchair 150 feet activity     Assist      Assist Level: Minimal Assistance - Patient > 75%   Blood pressure 107/67, pulse (!) 56, temperature 98 F (36.7 C), resp. rate 18, height '5\' 11"'$  (1.803 m), weight (!) 150.8 kg, SpO2 95 %.  Medical Problem List and Plan: 1.  Functional and mobility deficits secondary to gangrene/osteomyelitis of left foot which required a TMA             -patient may shower if left foot is covered             -ELOS/Goals: 12-14 days, supervision goals with PT and OT             -pt is NWB with CAM Boot LLE  -Continue CIR 2.  Antithrombotics: -DVT/anticoagulation:  Pharmaceutical: Other (comment) Eliquis             -antiplatelet therapy: N/A 3. Postsurgical pain: decrease Oxycodone to 1 tablet q4H prn  4. Mood: LCSW to follow for evaluation and support.              -antipsychotic agents: N/A 5. Neuropsych: This patient is capable of making decisions on his own behalf. 6. Post-surgical TMA incision care: continue BID dressing changes.  7. Hypokalemia: continue 56mq daily supplement 8. T2DM: Hgb A1c-6.4.  Continue Levemir with sliding scale insulin. Excellent control: d/c ISS. Contineu to check CBGs AC/HS so we know if we need to add oral medication or increase insulin.  9. Left foot infection: TO continue doxycycline and Augmentin-->end date 08/14.             --continue vitamin C and Zinc. Continue Juven to promote wound healing.   10. Chronic systolic CHF: Monitor for signs of overload.  Check weights daily.   --Continue Cozaar, coreg, Lipitor and Demadex. 11. CAF: Monitor HR TID--on Eliquis and Coreg for rate control.   LOS: 2 days A FACE TO FACE EVALUATION WAS PERFORMED  KClide DeutscherRaulkar 08/13/2020, 12:29 PM

## 2020-08-13 NOTE — Consult Note (Signed)
Hospital Consult    Reason for Consult:  Dr. Dagoberto Ligas Referring Physician:  PAD MRN #:  [id number]  History of Present Illness This is a 65 y.o. male currently in rehab status post left transmetatarsal amputation by Dr. Jacqualyn Posey with podiatry.  He states that this is healing well.  He does have ulceration of the right second and third toes.  No previous vascular disease other than vein ablation many years ago for venous stasis ulcers.  Patient does have atrial fibrillation for which she takes Eliquis last dose this morning.  He denies ever having previous angiography.  He does walk he is hopeful to not lose any more toes.  He is a former smoker quit over 10 years ago.  He also has diabetes, hyperlipidemia, hypertension as risk factors.  He does not have any previous history of stroke, TIA or amaurosis.  No personal family history of aneurysm disease.  Past Medical History:  Diagnosis Date   Acute systolic HF (heart failure) (HCC)    Arthritis    lt foot, hips knees    Atrial fibrillation ([place])    Diabetes mellitus, new onset ([place])    Diverticulosis    Dyslipidemia    Gout attack 01/16/2012   Hypertension    Internal hemorrhoids    Lymphoma ([place])    remission for about 2 years, chemo 3 years prior   Pneumonia due to COVID-19 virus 07/2018   Pulmonary hypertension ([place])    Sepsis ([place]) 02/2017   secondary to influenza; requiring trach   Tubular adenoma of colon    Venous stasis ulcers ([place])     Past Surgical History:  Procedure Laterality Date   AMPUTATION TOE Left 05/08/2019   Procedure: AMPUTATION LEFT GREAT  TOE;  Surgeon: Trula Slade, DPM;  Location: [facility];  Service: Podiatry;  Laterality: Left;   BRONCHOSCOPY     ESOPHAGOGASTRODUODENOSCOPY ENDOSCOPY     Dr Royden Purl HERNIA REPAIR Left    2001   IR REMOVAL TUN ACCESS W/ PORT W/O FL MOD SED  02/03/2020   PORTA CATH INSERTION  2011   TRACHEOSTOMY  03/2017   with decannulation   TRANSMETATARSAL AMPUTATION Left  08/05/2020   Procedure: TRANSMETATARSAL AMPUTATION;  Surgeon: Trula Slade, DPM;  Location: [place];  Service: Podiatry;  Laterality: Left;    Allergies  Allergen Reactions   Latex Hives and Swelling    Prior to Admission medications   Medication Sig Start Date End Date Taking? Authorizing Provider  acetaminophen (TYLENOL) 325 MG tablet Take 650 mg by mouth every 6 (six) hours as needed for moderate pain.     [provider]  albuterol (PROVENTIL HFA;VENTOLIN HFA) 108 (90 Base) MCG/ACT inhaler Inhale 2 puffs into the lungs every 6 (six) hours as needed for wheezing or shortness of breath. 04/05/17   Love, Ivan Anchors, PA-C  allopurinol (ZYLOPRIM) 300 MG tablet Take 300 mg by mouth daily. 12/03/18   [provider]  amoxicillin-clavulanate (AUGMENTIN) 875-125 MG tablet Take 1 tablet by mouth 2 (two) times daily for 14 days. 08/10/20 08/24/20  Charlynne Cousins, MD  apixaban (ELIQUIS) 5 MG TABS tablet TAKE 1 TABLET BY MOUTH TWICE A DAY 06/24/20   Minus Breeding, MD  atorvastatin (LIPITOR) 20 MG tablet TAKE 1 TABLET BY MOUTH EVERY DAY *PT OVERDUE FOR OFFICE VISIT* 04/02/18   Minus Breeding, MD  carvedilol (COREG) 12.5 MG tablet Take 1 tablet (12.5 mg total) by mouth 2 (two) times daily. 03/09/20  Minus Breeding, MD  cholecalciferol (VITAMIN D) 25 MCG (1000 UNIT) tablet Take 1,000 Units by mouth daily.    [provider]  [employer] DIGESTIVE PROBIOTIC 250 MG capsule Take 500 mg by mouth daily. 08/03/18   [provider]  doxycycline (VIBRA-TABS) 100 MG tablet Take 1 tablet (100 mg total) by mouth 2 (two) times daily for 14 days. 08/10/20 08/24/20  Charlynne Cousins, MD  HYDROcodone-acetaminophen (NORCO) 10-325 MG tablet Take 1 tablet by mouth every 6 (six) hours as needed for up to 7 days. 08/08/20 08/15/20  Landis Martins, DPM  KLOR-CON M20 20 MEQ tablet TAKE 1 TABLET BY MOUTH EVERY DAY 08/13/20   Minus Breeding, MD  losartan (COZAAR) 50 MG tablet TAKE 1 TABLET BY MOUTH  EVERY MORNING AND AT BEDTIME 07/13/20   Minus Breeding, MD  Omega 3 340 MG CPDR Take 1 tablet by mouth 2 (two) times daily. 06/16/09   [provider]  omeprazole (PRILOSEC) 40 MG capsule Take 40 mg by mouth daily.  10/15/18   [provider]  sertraline (ZOLOFT) 50 MG tablet Take 1 tablet (50 mg total) by mouth daily. 04/05/17   Love, Ivan Anchors, PA-C  torsemide (DEMADEX) 20 MG tablet Take 2 tablets by mouth daily alternating with 1 tablet by mouth daily. 07/27/20   Katherine Roan, MD  vitamin B-12 (CYANOCOBALAMIN) 1000 MCG tablet Take 1,000 mcg by mouth daily.    [provider]  vitamin C (VITAMIN C) 500 MG tablet Take 1 tablet (500 mg total) by mouth 3 (three) times daily. 04/05/17   Love, Ivan Anchors, PA-C  sodium chloride (OCEAN) 0.65 % SOLN nasal spray Place 2 sprays into both nostrils as needed for congestion. Patient not taking: Reported on 08/04/2020 04/05/17 08/04/20  Love, Ivan Anchors, PA-C    Social History   Socioeconomic History   Marital status: Divorced    Spouse name: Not on file   Number of children: Not on file   Years of education: Not on file   Highest education level: Not on file  Occupational History   Occupation: cosmetologist  Tobacco Use   Smoking status: Former    Packs/day: 0.50    Years: 20.00    Pack years: 10.00    Types: Cigarettes    Quit date: 01/10/2009    Years since quitting: 11.5   Smokeless tobacco: Never  Vaping Use   Vaping Use: Never used  Substance and Sexual Activity   Alcohol use: Not Currently    Comment: 2-3 times per week   Drug use: No   Sexual activity: Not on file  Other Topics Concern   Not on file  Social History Narrative   Not on file   Social Determinants of Health   Financial Resource Strain: Not on file  Food Insecurity: Not on file  Transportation Needs: Not on file  Physical Activity: Not on file  Stress: Not on file  Social Connections: Not on file  Intimate Partner Violence: Not on file    Family History  Problem Relation Age of Onset   Dementia Brother 61       frontal lobe    Dementia Mother    Arthritis Mother    Hypertension Mother    CVA Sister 58   Colon cancer Neg Hx    Pancreatic cancer Neg Hx    Stomach cancer Neg Hx    Rectal cancer Neg Hx    Liver cancer Neg Hx     ROS: Cardiovascular: '[]'$   chest pain/pressure '[]'$  palpitations '[]'$  SOB lying flat '[]'$  DOE '[]'$  pain in legs while walking '[]'$  pain in legs at rest '[]'$  pain in legs at night '[]'$  non-healing ulcers '[]'$  hx of DVT '[x]'$  swelling in legs  Pulmonary: '[]'$  productive cough '[]'$  asthma/wheezing '[]'$  home O2  Neurologic: '[]'$  weakness in '[]'$  arms '[]'$  legs '[x]'$  numbness in '[]'$  arms '[]'$  legs '[]'$  hx of CVA '[]'$  mini stroke '[]'$ difficulty speaking or slurred speech '[]'$  temporary loss of vision in one eye '[]'$  dizziness  Hematologic: '[]'$  hx of cancer '[]'$  bleeding problems '[]'$  problems with blood clotting easily  Endocrine:   '[x]'$  diabetes '[]'$  thyroid disease  GI '[]'$  vomiting blood '[]'$  blood in stool  GU: '[]'$  CKD/renal failure '[]'$  HD--'[]'$  M/W/F or '[]'$  T/T/S '[]'$  burning with urination '[]'$  blood in urine  Psychiatric: '[]'$  anxiety '[]'$  depression  Musculoskeletal: '[]'$  arthritis '[]'$  joint pain  Integumentary: '[]'$  rashes '[]'$  ulcers  Constitutional: '[]'$  fever '[]'$  chills   Physical Examination  Vitals:   08/13/20 0357 08/13/20 1319  BP: 107/67 (!) 104/54  Pulse: (!) 56 74  Resp: 18 18  Temp: 98 F (36.7 C) 97.9 F (36.6 C)  SpO2: 95% 98%   Body mass index is 46.37 kg/m.  General: Currently seated in wheelchair Gait: Not observed HENT: WNL, normocephalic Pulmonary: normal non-labored breathing, Cardiac: I cannot palpate popliteal or tibial pulses.  Common femoral pulses not palpated due to patient positioning and habitus Abdomen:  soft, NT/ND, no masses Extremities: Changes consistent with venous disease bilateral lower extremities There is a dressing on the left foot or previous transmetatarsal amputation  performed Right second and third toes with ulceration on the dorsum   CBC    Component Value Date/Time   WBC 8.1 08/12/2020 0459   RBC 3.85 (L) 08/12/2020 0459   HGB 12.2 (L) 08/12/2020 0459   HGB 12.7 (L) 01/07/2020 1007   HGB 13.0 04/30/2019 0813   HGB 14.5 10/24/2016 1440   HCT 37.1 (L) 08/12/2020 0459   HCT 40.8 04/30/2019 0813   HCT 43.8 10/24/2016 1440   PLT 264 08/12/2020 0459   PLT 147 (L) 01/07/2020 1007   PLT 187 04/30/2019 0813   MCV 96.4 08/12/2020 0459   MCV 91 04/30/2019 0813   MCV 92.4 10/24/2016 1440   MCH 31.7 08/12/2020 0459   MCHC 32.9 08/12/2020 0459   RDW 14.8 08/12/2020 0459   RDW 15.5 (H) 04/30/2019 0813   RDW 15.2 (H) 10/24/2016 1440   LYMPHSABS 1.2 08/12/2020 0459   LYMPHSABS 1.0 04/30/2019 0813   LYMPHSABS 1.7 10/24/2016 1440   MONOABS 0.8 08/12/2020 0459   MONOABS 0.9 10/24/2016 1440   EOSABS 0.2 08/12/2020 0459   EOSABS 0.1 04/30/2019 0813   BASOSABS 0.1 08/12/2020 0459   BASOSABS 0.1 04/30/2019 0813   BASOSABS 0.1 10/24/2016 1440    BMET    Component Value Date/Time   NA 137 08/12/2020 0459   NA 141 11/11/2019 1506   NA 137 10/24/2016 1440   K 3.5 08/12/2020 0459   K 3.6 10/24/2016 1440   CL 98 08/12/2020 0459   CL 105 05/14/2012 0840   CO2 28 08/12/2020 0459   CO2 26 10/24/2016 1440   GLUCOSE 106 (H) 08/12/2020 0459   GLUCOSE 94 10/24/2016 1440   GLUCOSE 102 (H) 05/14/2012 0840   BUN 10 08/12/2020 0459   BUN 16 11/11/2019 1506   BUN 16.2 10/24/2016 1440   CREATININE 0.80 08/12/2020 0459   CREATININE 0.84 01/07/2020 1007   CREATININE  0.8 10/24/2016 1440   CALCIUM 8.8 (L) 08/12/2020 0459   CALCIUM 9.2 10/24/2016 1440   GFRNONAA >60 08/12/2020 0459   GFRNONAA >60 01/07/2020 1007   GFRAA 106 11/11/2019 1506   GFRAA >60 01/07/2019 1458    COAGS: Lab Results  Component Value Date   INR 1.2 02/03/2020   INR 1.14 02/16/2017   INR 0.87 02/18/2010     Non-Invasive Vascular Imaging:   ABI Findings:   +---------+------------------+-----+---------+--------+  Right    Rt Pressure (mmHg)IndexWaveform Comment   +---------+------------------+-----+---------+--------+  Brachial 136                    triphasic          +---------+------------------+-----+---------+--------+  PTA      172               1.26 biphasic           +---------+------------------+-----+---------+--------+  DP       157               1.15 biphasic           +---------+------------------+-----+---------+--------+  Great Toe89                0.65 Abnormal Mild      +---------+------------------+-----+---------+--------+   +---------+------------------+-----+----------+-------+  Left     Lt Pressure (mmHg)IndexWaveform  Comment  +---------+------------------+-----+----------+-------+  Brachial                        triphasic IV       +---------+------------------+-----+----------+-------+  PTA      139               1.02 monophasic         +---------+------------------+-----+----------+-------+  DP       121               0.89 monophasic         +---------+------------------+-----+----------+-------+  Great Toe                                 TMA      +---------+------------------+-----+----------+-------+   +-------+-----------+-----------+------------+------------+  ABI/TBIToday's ABIToday's TBIPrevious ABIPrevious TBI  +-------+-----------+-----------+------------+------------+  Right  1.26       0.65       1.04        0.62          +-------+-----------+-----------+------------+------------+  Left   1.15       TMA        1.06        0.76          +-------+-----------+-----------+------------+------------+   Summary:  Right: Resting right ankle-brachial index is within normal range. No  evidence of significant right lower extremity arterial disease. The right  toe-brachial index is abnormal, indicating small vessel disease.    Left: ABIs are unreliable.   ABI value indicates normal resting flow however, monophasic waveforms in  presence of diabetic patient indicative of vessel calcification which may  underestimate level of disease. Post TMA - unable to obtain toe pressures.    ASSESSMENT/PLAN: This is a 65 y.o. male here with left transmetatarsal amputation.  He has right second and third toe at ulceration which appears to be ischemic as well.  ABIs are noncompressible with a toe pressure on the right of 89 but the left has had transmetatarsal amputation with monophasic waveforms.  I  discussed beginning with angiography to evaluate his bilateral lower extremities for possible intervention to ensure we can heal his left transmetatarsal amputation and possible amputations of right second and third toes.  Patient does take Eliquis last dose this morning but we will hold until after procedure which will likely be in the afternoon tomorrow.  He will be n.p.o. past midnight.  I discussed the risk benefits and alternatives as well as the fact that this will be performed by one of my partners and he demonstrates good understanding.  Therisa Mennella C. Donzetta Matters, MD [facility] Office: [phone] Pager: [phone]

## 2020-08-13 NOTE — Progress Notes (Signed)
Occupational Therapy Session Note  Patient Details  Name: [name] MRN: [id number] Date of Birth: 05/27/1955  Today's Date: 08/13/2020 OT Individual Time: [id number] OT Individual Time Calculation (min): 53 min    Short Term Goals: Week 1:  OT Short Term Goal 1 (Week 1): Pt will complete BSC/toilet transfer with LRAD and good precaution adherence with Min A OT Short Term Goal 2 (Week 1): Pt will perform LB dress w/ AE PRN with Min A OT Short Term Goal 3 (Week 1): Pt will perform bathing tasks with no more than Min A with AE PRN OT Short Term Goal 4 (Week 1): Pt will perform sit <> stands in prep for ADL with Min A  Skilled Therapeutic Interventions/Progress Updates:    Pt greeted at time of session sitting up in wheelchair no pain, wanting to take a shower. NT present at beginning of session to remove IV. Stand pivot wheelchair > bariatric BSC in shower with Mod A for power up and stand pivot on RLE, cues to decrease shearing force but pt difficulty "hopping" and insisting he can only pivot/rotate on RLE. Assist to maintain NWB on LLE with [facility] for transfers. Seated on BSC, pt did have BM prior to taking shower. Mod A overall for toileting and Min/Mod for showering at shower level for LE's past knee level, able to anterior weight shift to reach buttocks. Pt with significant fatigue after shower for sit <> stands and standing balance with support of grab bars to don/doff clothing over hips. Max A for underwear and shorts prior to transferring back to wheelchair. Pt with decreased safety during transfer back to wheelchair trying sit sit prematurely. Set up in room alarm on call bell in reach.   Therapy Documentation Precautions:  Precautions Precautions: Fall Precaution Comments: venous stasis, NWB LLE w/ DARCO Required Braces or Orthoses: Other Brace Other Brace: L darco shoe Restrictions Weight Bearing Restrictions: Yes LLE Weight Bearing: Non weight bearing Other Position/Activity  Restrictions: Pt has difficulty maintaining weight bearing in standing and fatigues quickly.     Therapy/Group: Individual Therapy  Viona Gilmore 08/13/2020, 3:25 PM

## 2020-08-13 NOTE — Progress Notes (Signed)
Occupational Therapy Session Note  Patient Details  Name: [name] MRN: [id number] Date of Birth: [birth year]  Today's Date: 08/13/2020 OT Individual Time:  - 60 minutes missed and 30 minutes missed     Short Term Goals: Week 1:  OT Short Term Goal 1 (Week 1): Pt will complete BSC/toilet transfer with LRAD and good precaution adherence with Min A OT Short Term Goal 2 (Week 1): Pt will perform LB dress w/ AE PRN with Min A OT Short Term Goal 3 (Week 1): Pt will perform bathing tasks with no more than Min A with AE PRN OT Short Term Goal 4 (Week 1): Pt will perform sit <> stands in prep for ADL with Min A  Skilled Therapeutic Interventions/Progress Updates:    Pt greeted while in bed, in care of RN. Pt and RN stated that transport was arriving around 10 am (scheduled OT time) to take him down to surgery. Time missed due to medical procedure. Attempted to see pt in the afternoon and pt off of the unit for procedure. Additional tx time missed.  Therapy Documentation Precautions:  Precautions Precautions: Fall Precaution Comments: venous stasis, NWB LLE w/ DARCO Required Braces or Orthoses: Other Brace Other Brace: L darco shoe Restrictions Weight Bearing Restrictions: Yes LLE Weight Bearing: Non weight bearing Other Position/Activity Restrictions: Pt has difficulty maintaining weight bearing in standing and fatigues quickly. Therapy Vitals Temp: 98.5 F (36.9 C) Temp Source: Oral Pulse Rate: 80 Resp: 18 BP: 138/79 Patient Position (if appropriate): Lying Oxygen Therapy SpO2: 96 % O2 Device: Room Air    ADL: ADL Eating: Not assessed Grooming: Setup Upper Body Bathing: Setup Lower Body Bathing: Moderate assistance Upper Body Dressing: Setup Lower Body Dressing: Maximal assistance Toileting: Moderate assistance Toilet Transfer: Moderate assistance Toilet Transfer Method: Stand pivot Toilet Transfer Equipment: Extra wide drop arm bedside commode     Therapy/Group:  Individual Therapy  Citlalli Weikel A Aliha Diedrich 08/13/2020, 9:03 PM

## 2020-08-13 NOTE — Progress Notes (Signed)
Physical Therapy Session Note  Patient Details  Name: [name] MRN: [id number] Date of Birth: 1956/01/01  Today's Date: 08/13/2020 PT Individual Time: [phone] and [id number] PT Individual Time Calculation (min): 70 min and 59 min  Short Term Goals: Week 1:  PT Short Term Goal 1 (Week 1): Pt will be able to maintain WB precautions with gait up to 20 ft. PT Short Term Goal 2 (Week 1): Pt will initiate stair training. PT Short Term Goal 3 (Week 1): Pt will tolerate up to 30 min of activity.  Skilled Therapeutic Interventions/Progress Updates:    Session 1: Pt received supine in bed and agreeable to therapy session. Reports sleeping well last night. Donned L LE DARCO shoe total assist. Supine>sitting R EOB, HOB flat but needed use of bedrails, with supervision. Pt able to recall L LE NWBing precaution. L stand pivot elevated EOB>w/c using bari-RW (pt states all of his furniture is elevated at home because of his height) with min assist lifting to stand - pt able to recall extending L LE during transition from sit>stand to decrease ability to WB - during turn requires mod assist for balance with pt able to maintain L LE NWBing well until trying to hope backwards, requires therapist to bring w/c up closer to him due to inability to hop backwards. Pt reports sudden urge to use bathroom. R stand pivot w/c>BSC using RW with mod assist for balance - continues to have difficulty maintaining L LE NWBing during transition from sit>stand. Standing with min assist for balance during total assist LB clothing management due to urgency. Continent of BM and performed seated peri-care with set-up assist. L stand pivot BSC>w/c using bari-RW with min/mod assist for lifting to stand and balance.   B UE w/c propulsion ~58f 2x to main therapy gym, requires 1x seated rest break due to B UE fatigue, demos efficient propulsion technique but just with slower pattern. Sit<>stands in // bars with min assist - continued cuing  to maintain L LE NWBing during transitions from sit<>stand (therapist placing foot behind pt's L LE to prevent WBing) but once in standing pt does better job of holding up L LE to maintain precautions. In // bars performed repeated hoping in place working on B UE strengthening in preparation for hop-to gait pattern 3x10-12 reps - pt demos good ability to maintain L LE NWBing - therapist providing min assist to facilitate lifting up to improved R LE foot clearance from floor with pt progressing to being able to very slightly clear foot.  Seated in w/c performed L LE long arc quads 2x20reps with 5lb ankle weight - cuing to hold terminal knee extension briefly on each rep for increased motor recruitment.  B UE w/c propulsion ~1562fback to room, no rest break required this time, with supervision. Pt left seated in w/c with needs in reach and seat belt alarm on.  Session 2: Pt received sitting in w/c and agreeable to therapy session. Reports some muscle fatigue and soreness from AM session but no intervention needed. B UE w/c propulsion ~[id number] main therapy gym with supervision - seated rest break then propelled ~[id number]. Transported remainder of distance to gym for energy conservation.  Sit>stands w/c>bari-RW with min/light mod assist for lifting to stand and balance - therapist continuing to place foot under pt's L LE to cue for NWBing status with pt demonstrating significant improvement in ability to maintain this during sit<>stand transitions throughout session - intermittently will require 2nd attempt prior to  coming to full stand.  Gait training ~60f 3x using bari-RW with min/mod assist of 1 for lifting/balance and +2 close w/c follow - adjusted height of RW between gait trails with pt demoing increased R foot clearance with slightly taller AD support - pt continues to be unable to truly clear R foot but has more of a shuffle/slide forward and fatigues quickly with this task. Continued  education on performing daily R foot skin assessments to ensure no skin breakdown [place] (plan to don R shoe when pt demos improved ability to clear foot from ground decreasing fall risk).  L stand pivot w/c>EOM using bari-RW with min/mod assist for lifting and balance while turning - continued cuing to turn AD and position it for increased support - pt is unable to hop backwards towards mat despite multiple attempts therefore has to reach further back to sit down on mat safely.   Standing balance and tolerate task via reaching to place clothespins on basketball goal - started with R hand progressed to L hand reaching all only slightly outside BOS - min assist for balance.  R stand pivot EOM>w/c using RW with min/mod assist for balance and pt demoing increased ease transferring in this direction because of ability to shuffle/pivot/twist on R foot to complete turn.  Transported back to room and left seated in w/c with needs in reach and seat belt alarm on.   Therapy Documentation Precautions:  Precautions Precautions: Fall Precaution Comments: venous stasis, NWB LLE w/ DARCO Required Braces or Orthoses: Other Brace Other Brace: L darco shoe Restrictions Weight Bearing Restrictions: Yes LLE Weight Bearing: Non weight bearing Other Position/Activity Restrictions: Pt has difficulty maintaining weight bearing in standing and fatigues quickly.   Pain:  Session 1: Reports not having pain in L LE during session but states yesterday after PT session had throbbing pain in L LE when lying down despite not having any pain during session.   Session 2: No complaints of pain during session.  Therapy/Group: Individual Therapy  CTawana Scale, PT, DPT, NCS, CSRS 08/13/2020, 7:49 AM

## 2020-08-13 NOTE — H&P (View-Only) (Signed)
Hospital Consult    Reason for Consult:  Dr. Dagoberto Ligas Referring Physician:  PAD MRN #:  [id number]  History of Present Illness This is a 65 y.o. male currently in rehab status post left transmetatarsal amputation by Dr. Jacqualyn Posey with podiatry.  He states that this is healing well.  He does have ulceration of the right second and third toes.  No previous vascular disease other than vein ablation many years ago for venous stasis ulcers.  Patient does have atrial fibrillation for which she takes Eliquis last dose this morning.  He denies ever having previous angiography.  He does walk he is hopeful to not lose any more toes.  He is a former smoker quit over 10 years ago.  He also has diabetes, hyperlipidemia, hypertension as risk factors.  He does not have any previous history of stroke, TIA or amaurosis.  No personal family history of aneurysm disease.  Past Medical History:  Diagnosis Date   Acute systolic HF (heart failure) (HCC)    Arthritis    lt foot, hips knees    Atrial fibrillation ([place])    Diabetes mellitus, new onset ([place])    Diverticulosis    Dyslipidemia    Gout attack 01/16/2012   Hypertension    Internal hemorrhoids    Lymphoma ([place])    remission for about 2 years, chemo 3 years prior   Pneumonia due to COVID-19 virus 07/2018   Pulmonary hypertension ([place])    Sepsis ([place]) 02/2017   secondary to influenza; requiring trach   Tubular adenoma of colon    Venous stasis ulcers ([place])     Past Surgical History:  Procedure Laterality Date   AMPUTATION TOE Left 05/08/2019   Procedure: AMPUTATION LEFT GREAT  TOE;  Surgeon: Trula Slade, DPM;  Location: [facility];  Service: Podiatry;  Laterality: Left;   BRONCHOSCOPY     ESOPHAGOGASTRODUODENOSCOPY ENDOSCOPY     Dr Royden Purl HERNIA REPAIR Left    2001   IR REMOVAL TUN ACCESS W/ PORT W/O FL MOD SED  02/03/2020   PORTA CATH INSERTION  2011   TRACHEOSTOMY  03/2017   with decannulation   TRANSMETATARSAL AMPUTATION Left  08/05/2020   Procedure: TRANSMETATARSAL AMPUTATION;  Surgeon: Trula Slade, DPM;  Location: [facility];  Service: Podiatry;  Laterality: Left;    Allergies  Allergen Reactions   Latex Hives and Swelling    Prior to Admission medications   Medication Sig Start Date End Date Taking? Authorizing Provider  acetaminophen (TYLENOL) 325 MG tablet Take 650 mg by mouth every 6 (six) hours as needed for moderate pain.     [provider]  albuterol (PROVENTIL HFA;VENTOLIN HFA) 108 (90 Base) MCG/ACT inhaler Inhale 2 puffs into the lungs every 6 (six) hours as needed for wheezing or shortness of breath. 04/05/17   Love, Ivan Anchors, PA-C  allopurinol (ZYLOPRIM) 300 MG tablet Take 300 mg by mouth daily. 12/03/18   [provider]  amoxicillin-clavulanate (AUGMENTIN) 875-125 MG tablet Take 1 tablet by mouth 2 (two) times daily for 14 days. 08/10/20 08/24/20  Charlynne Cousins, MD  apixaban (ELIQUIS) 5 MG TABS tablet TAKE 1 TABLET BY MOUTH TWICE A DAY 06/24/20   Minus Breeding, MD  atorvastatin (LIPITOR) 20 MG tablet TAKE 1 TABLET BY MOUTH EVERY DAY *PT OVERDUE FOR OFFICE VISIT* 04/02/18   Minus Breeding, MD  carvedilol (COREG) 12.5 MG tablet Take 1 tablet (12.5 mg total) by mouth 2 (two) times daily. 03/09/20  Minus Breeding, MD  cholecalciferol (VITAMIN D) 25 MCG (1000 UNIT) tablet Take 1,000 Units by mouth daily.    [provider]  [employer] DIGESTIVE PROBIOTIC 250 MG capsule Take 500 mg by mouth daily. 08/03/18   [provider]  doxycycline (VIBRA-TABS) 100 MG tablet Take 1 tablet (100 mg total) by mouth 2 (two) times daily for 14 days. 08/10/20 08/24/20  Charlynne Cousins, MD  HYDROcodone-acetaminophen (NORCO) 10-325 MG tablet Take 1 tablet by mouth every 6 (six) hours as needed for up to 7 days. 08/08/20 08/15/20  Landis Martins, DPM  KLOR-CON M20 20 MEQ tablet TAKE 1 TABLET BY MOUTH EVERY DAY 08/13/20   Minus Breeding, MD  losartan (COZAAR) 50 MG tablet TAKE 1 TABLET BY MOUTH  EVERY MORNING AND AT BEDTIME 07/13/20   Minus Breeding, MD  Omega 3 340 MG CPDR Take 1 tablet by mouth 2 (two) times daily. 06/16/09   [provider]  omeprazole (PRILOSEC) 40 MG capsule Take 40 mg by mouth daily.  10/15/18   [provider]  sertraline (ZOLOFT) 50 MG tablet Take 1 tablet (50 mg total) by mouth daily. 04/05/17   Love, Ivan Anchors, PA-C  torsemide (DEMADEX) 20 MG tablet Take 2 tablets by mouth daily alternating with 1 tablet by mouth daily. 07/27/20   Katherine Roan, MD  vitamin B-12 (CYANOCOBALAMIN) 1000 MCG tablet Take 1,000 mcg by mouth daily.    [provider]  vitamin C (VITAMIN C) 500 MG tablet Take 1 tablet (500 mg total) by mouth 3 (three) times daily. 04/05/17   Love, Ivan Anchors, PA-C  sodium chloride (OCEAN) 0.65 % SOLN nasal spray Place 2 sprays into both nostrils as needed for congestion. Patient not taking: Reported on 08/04/2020 04/05/17 08/04/20  Love, Ivan Anchors, PA-C    Social History   Socioeconomic History   Marital status: Divorced    Spouse name: Not on file   Number of children: Not on file   Years of education: Not on file   Highest education level: Not on file  Occupational History   Occupation: cosmetologist  Tobacco Use   Smoking status: Former    Packs/day: 0.50    Years: 20.00    Pack years: 10.00    Types: Cigarettes    Quit date: 01/10/2009    Years since quitting: 11.5   Smokeless tobacco: Never  Vaping Use   Vaping Use: Never used  Substance and Sexual Activity   Alcohol use: Not Currently    Comment: 2-3 times per week   Drug use: No   Sexual activity: Not on file  Other Topics Concern   Not on file  Social History Narrative   Not on file   Social Determinants of Health   Financial Resource Strain: Not on file  Food Insecurity: Not on file  Transportation Needs: Not on file  Physical Activity: Not on file  Stress: Not on file  Social Connections: Not on file  Intimate Partner Violence: Not on file    Family History  Problem Relation Age of Onset   Dementia Brother 51       frontal lobe    Dementia Mother    Arthritis Mother    Hypertension Mother    CVA Sister 55   Colon cancer Neg Hx    Pancreatic cancer Neg Hx    Stomach cancer Neg Hx    Rectal cancer Neg Hx    Liver cancer Neg Hx     ROS: Cardiovascular: '[]'$   chest pain/pressure '[]'$  palpitations '[]'$  SOB lying flat '[]'$  DOE '[]'$  pain in legs while walking '[]'$  pain in legs at rest '[]'$  pain in legs at night '[]'$  non-healing ulcers '[]'$  hx of DVT '[x]'$  swelling in legs  Pulmonary: '[]'$  productive cough '[]'$  asthma/wheezing '[]'$  home O2  Neurologic: '[]'$  weakness in '[]'$  arms '[]'$  legs '[x]'$  numbness in '[]'$  arms '[]'$  legs '[]'$  hx of CVA '[]'$  mini stroke '[]'$ difficulty speaking or slurred speech '[]'$  temporary loss of vision in one eye '[]'$  dizziness  Hematologic: '[]'$  hx of cancer '[]'$  bleeding problems '[]'$  problems with blood clotting easily  Endocrine:   '[x]'$  diabetes '[]'$  thyroid disease  GI '[]'$  vomiting blood '[]'$  blood in stool  GU: '[]'$  CKD/renal failure '[]'$  HD--'[]'$  M/W/F or '[]'$  T/T/S '[]'$  burning with urination '[]'$  blood in urine  Psychiatric: '[]'$  anxiety '[]'$  depression  Musculoskeletal: '[]'$  arthritis '[]'$  joint pain  Integumentary: '[]'$  rashes '[]'$  ulcers  Constitutional: '[]'$  fever '[]'$  chills   Physical Examination  Vitals:   08/13/20 0357 08/13/20 1319  BP: 107/67 (!) 104/54  Pulse: (!) 56 74  Resp: 18 18  Temp: 98 F (36.7 C) 97.9 F (36.6 C)  SpO2: 95% 98%   Body mass index is 46.37 kg/m.  General: Currently seated in wheelchair Gait: Not observed HENT: WNL, normocephalic Pulmonary: normal non-labored breathing, Cardiac: I cannot palpate popliteal or tibial pulses.  Common femoral pulses not palpated due to patient positioning and habitus Abdomen:  soft, NT/ND, no masses Extremities: Changes consistent with venous disease bilateral lower extremities There is a dressing on the left foot or previous transmetatarsal amputation  performed Right second and third toes with ulceration on the dorsum   CBC    Component Value Date/Time   WBC 8.1 08/12/2020 0459   RBC 3.85 (L) 08/12/2020 0459   HGB 12.2 (L) 08/12/2020 0459   HGB 12.7 (L) 01/07/2020 1007   HGB 13.0 04/30/2019 0813   HGB 14.5 10/24/2016 1440   HCT 37.1 (L) 08/12/2020 0459   HCT 40.8 04/30/2019 0813   HCT 43.8 10/24/2016 1440   PLT 264 08/12/2020 0459   PLT 147 (L) 01/07/2020 1007   PLT 187 04/30/2019 0813   MCV 96.4 08/12/2020 0459   MCV 91 04/30/2019 0813   MCV 92.4 10/24/2016 1440   MCH 31.7 08/12/2020 0459   MCHC 32.9 08/12/2020 0459   RDW 14.8 08/12/2020 0459   RDW 15.5 (H) 04/30/2019 0813   RDW 15.2 (H) 10/24/2016 1440   LYMPHSABS 1.2 08/12/2020 0459   LYMPHSABS 1.0 04/30/2019 0813   LYMPHSABS 1.7 10/24/2016 1440   MONOABS 0.8 08/12/2020 0459   MONOABS 0.9 10/24/2016 1440   EOSABS 0.2 08/12/2020 0459   EOSABS 0.1 04/30/2019 0813   BASOSABS 0.1 08/12/2020 0459   BASOSABS 0.1 04/30/2019 0813   BASOSABS 0.1 10/24/2016 1440    BMET    Component Value Date/Time   NA 137 08/12/2020 0459   NA 141 11/11/2019 1506   NA 137 10/24/2016 1440   K 3.5 08/12/2020 0459   K 3.6 10/24/2016 1440   CL 98 08/12/2020 0459   CL 105 05/14/2012 0840   CO2 28 08/12/2020 0459   CO2 26 10/24/2016 1440   GLUCOSE 106 (H) 08/12/2020 0459   GLUCOSE 94 10/24/2016 1440   GLUCOSE 102 (H) 05/14/2012 0840   BUN 10 08/12/2020 0459   BUN 16 11/11/2019 1506   BUN 16.2 10/24/2016 1440   CREATININE 0.80 08/12/2020 0459   CREATININE 0.84 01/07/2020 1007   CREATININE  0.8 10/24/2016 1440   CALCIUM 8.8 (L) 08/12/2020 0459   CALCIUM 9.2 10/24/2016 1440   GFRNONAA >60 08/12/2020 0459   GFRNONAA >60 01/07/2020 1007   GFRAA 106 11/11/2019 1506   GFRAA >60 01/07/2019 1458    COAGS: Lab Results  Component Value Date   INR 1.2 02/03/2020   INR 1.14 02/16/2017   INR 0.87 02/18/2010     Non-Invasive Vascular Imaging:   ABI Findings:   +---------+------------------+-----+---------+--------+  Right    Rt Pressure (mmHg)IndexWaveform Comment   +---------+------------------+-----+---------+--------+  Brachial 136                    triphasic          +---------+------------------+-----+---------+--------+  PTA      172               1.26 biphasic           +---------+------------------+-----+---------+--------+  DP       157               1.15 biphasic           +---------+------------------+-----+---------+--------+  Great Toe89                0.65 Abnormal Mild      +---------+------------------+-----+---------+--------+   +---------+------------------+-----+----------+-------+  Left     Lt Pressure (mmHg)IndexWaveform  Comment  +---------+------------------+-----+----------+-------+  Brachial                        triphasic IV       +---------+------------------+-----+----------+-------+  PTA      139               1.02 monophasic         +---------+------------------+-----+----------+-------+  DP       121               0.89 monophasic         +---------+------------------+-----+----------+-------+  Great Toe                                 TMA      +---------+------------------+-----+----------+-------+   +-------+-----------+-----------+------------+------------+  ABI/TBIToday's ABIToday's TBIPrevious ABIPrevious TBI  +-------+-----------+-----------+------------+------------+  Right  1.26       0.65       1.04        0.62          +-------+-----------+-----------+------------+------------+  Left   1.15       TMA        1.06        0.76          +-------+-----------+-----------+------------+------------+   Summary:  Right: Resting right ankle-brachial index is within normal range. No  evidence of significant right lower extremity arterial disease. The right  toe-brachial index is abnormal, indicating small vessel disease.    Left: ABIs are unreliable.   ABI value indicates normal resting flow however, monophasic waveforms in  presence of diabetic patient indicative of vessel calcification which may  underestimate level of disease. Post TMA - unable to obtain toe pressures.    ASSESSMENT/PLAN: This is a 65 y.o. male here with left transmetatarsal amputation.  He has right second and third toe at ulceration which appears to be ischemic as well.  ABIs are noncompressible with a toe pressure on the right of 89 but the left has had transmetatarsal amputation with monophasic waveforms.  I  discussed beginning with angiography to evaluate his bilateral lower extremities for possible intervention to ensure we can heal his left transmetatarsal amputation and possible amputations of right second and third toes.  Patient does take Eliquis last dose this morning but we will hold until after procedure which will likely be in the afternoon tomorrow.  He will be n.p.o. past midnight.  I discussed the risk benefits and alternatives as well as the fact that this will be performed by one of my partners and he demonstrates good understanding.  Azrael Maddix C. Donzetta Matters, MD [facility] Office: [phone] Pager: [phone]

## 2020-08-14 ENCOUNTER — Observation Stay (HOSPITAL_COMMUNITY)
Admission: RE | Admit: 2020-08-14 | Discharge: 2020-08-18 | Disposition: A | Discharge status: Home / Self Care | Payer: Medicare Other | Source: Ambulatory Visit | Attending: Vascular Surgery | Admitting: Vascular Surgery

## 2020-08-14 ENCOUNTER — Encounter (HOSPITAL_COMMUNITY)
Admission: RE | Disposition: A | Discharge status: Other Acute Inpatient Hospital | Payer: Self-pay | Source: Intra-hospital | Attending: Physical Medicine and Rehabilitation

## 2020-08-14 DIAGNOSIS — I70235 Atherosclerosis of native arteries of right leg with ulceration of other part of foot: Secondary | ICD-10-CM

## 2020-08-14 DIAGNOSIS — I739 Peripheral vascular disease, unspecified: Secondary | ICD-10-CM | POA: Diagnosis not present

## 2020-08-14 DIAGNOSIS — M6281 Muscle weakness (generalized): Secondary | ICD-10-CM | POA: Diagnosis not present

## 2020-08-14 DIAGNOSIS — I110 Hypertensive heart disease with heart failure: Secondary | ICD-10-CM | POA: Diagnosis not present

## 2020-08-14 DIAGNOSIS — Z8616 Personal history of COVID-19: Secondary | ICD-10-CM | POA: Diagnosis not present

## 2020-08-14 DIAGNOSIS — I5021 Acute systolic (congestive) heart failure: Secondary | ICD-10-CM | POA: Diagnosis not present

## 2020-08-14 DIAGNOSIS — E119 Type 2 diabetes mellitus without complications: Secondary | ICD-10-CM | POA: Diagnosis not present

## 2020-08-14 DIAGNOSIS — Z7901 Long term (current) use of anticoagulants: Secondary | ICD-10-CM | POA: Diagnosis not present

## 2020-08-14 DIAGNOSIS — Z91040 Latex allergy status: Secondary | ICD-10-CM | POA: Insufficient documentation

## 2020-08-14 DIAGNOSIS — Z87891 Personal history of nicotine dependence: Secondary | ICD-10-CM | POA: Diagnosis not present

## 2020-08-14 DIAGNOSIS — I70245 Atherosclerosis of native arteries of left leg with ulceration of other part of foot: Secondary | ICD-10-CM

## 2020-08-14 DIAGNOSIS — I4891 Unspecified atrial fibrillation: Secondary | ICD-10-CM | POA: Insufficient documentation

## 2020-08-14 DIAGNOSIS — Z89431 Acquired absence of right foot: Secondary | ICD-10-CM | POA: Diagnosis not present

## 2020-08-14 HISTORY — PX: ABDOMINAL AORTOGRAM W/LOWER EXTREMITY: CATH118223

## 2020-08-14 LAB — GLUCOSE, CAPILLARY
Glucose-Capillary: 111 mg/dL — ABNORMAL HIGH (ref 70–99)
Glucose-Capillary: 108 mg/dL — ABNORMAL HIGH (ref 70–99)
Glucose-Capillary: 101 mg/dL — ABNORMAL HIGH (ref 70–99)
Glucose-Capillary: 95 mg/dL (ref 70–99)

## 2020-08-14 SURGERY — ABDOMINAL AORTOGRAM W/LOWER EXTREMITY
Anesthesia: LOCAL

## 2020-08-14 MED ORDER — IODIXANOL 320 MG/ML IV SOLN
INTRAVENOUS | Status: DC | PRN
  Administered 2020-08-14: 120 mL via INTRA_ARTERIAL

## 2020-08-14 MED ORDER — HEPARIN (PORCINE) IN NACL 1000-0.9 UT/500ML-% IV SOLN
INTRAVENOUS | Status: DC | PRN
  Administered 2020-08-14 (×2): 500 mL

## 2020-08-14 MED ORDER — FENTANYL CITRATE (PF) 100 MCG/2ML IJ SOLN
INTRAMUSCULAR | Status: DC | PRN
  Administered 2020-08-14: 25 ug via INTRAVENOUS

## 2020-08-14 MED ORDER — OXYCODONE HCL 5 MG PO TABS
5.0000 mg | ORAL_TABLET | ORAL | Status: DC | PRN
  Administered 2020-08-15 – 2020-08-17 (×5): 5 mg via ORAL
  Filled 2020-08-14 (×5): qty 1

## 2020-08-14 MED ORDER — DOCUSATE SODIUM 100 MG PO CAPS: 100.0000 mg | ORAL_CAPSULE | Freq: Every day | ORAL | Status: DC

## 2020-08-14 MED ORDER — MIDAZOLAM HCL 2 MG/2ML IJ SOLN
INTRAMUSCULAR | Status: DC | PRN
  Administered 2020-08-14: 1 mg via INTRAVENOUS

## 2020-08-14 MED ORDER — MORPHINE SULFATE (PF) 2 MG/ML IV SOLN: 2.0000 mg | INTRAVENOUS | Status: DC | PRN

## 2020-08-14 MED ORDER — FENTANYL CITRATE (PF) 100 MCG/2ML IJ SOLN
INTRAMUSCULAR | Status: AC
  Filled 2020-08-14: qty 2

## 2020-08-14 MED ORDER — SODIUM CHLORIDE 0.9% FLUSH: 3.0000 mL | INTRAVENOUS | Status: DC | PRN

## 2020-08-14 MED ORDER — MIDAZOLAM HCL 2 MG/2ML IJ SOLN
INTRAMUSCULAR | Status: AC
  Filled 2020-08-14: qty 2

## 2020-08-14 MED ORDER — SODIUM CHLORIDE 0.9 % IV SOLN: 250.0000 mL | INTRAVENOUS | Status: DC | PRN

## 2020-08-14 MED ORDER — LIDOCAINE HCL (PF) 1 % IJ SOLN
INTRAMUSCULAR | Status: AC
  Filled 2020-08-14: qty 30

## 2020-08-14 MED ORDER — NITROGLYCERIN 0.1 MG/HR TD PT24
0.1000 mg | MEDICATED_PATCH | Freq: Every day | TRANSDERMAL | Status: DC
  Filled 2020-08-14: qty 1

## 2020-08-14 MED ORDER — ACETAMINOPHEN 325 MG PO TABS
650.0000 mg | ORAL_TABLET | ORAL | Status: DC | PRN
  Administered 2020-08-15: 650 mg via ORAL
  Filled 2020-08-14: qty 2

## 2020-08-14 MED ORDER — HEPARIN (PORCINE) IN NACL 1000-0.9 UT/500ML-% IV SOLN
INTRAVENOUS | Status: AC
  Filled 2020-08-14: qty 1000

## 2020-08-14 MED ORDER — SODIUM CHLORIDE 0.9% FLUSH
3.0000 mL | Freq: Two times a day (BID) | INTRAVENOUS | Status: DC
  Administered 2020-08-14 – 2020-08-18 (×7): 3 mL via INTRAVENOUS

## 2020-08-14 MED ORDER — HYDRALAZINE HCL 20 MG/ML IJ SOLN: 5.0000 mg | INTRAMUSCULAR | Status: DC | PRN

## 2020-08-14 MED ORDER — LABETALOL HCL 5 MG/ML IV SOLN: 10.0000 mg | INTRAVENOUS | Status: DC | PRN

## 2020-08-14 MED ORDER — SODIUM CHLORIDE 0.9 % IV SOLN: INTRAVENOUS | Status: DC

## 2020-08-14 MED ORDER — LIDOCAINE HCL (PF) 1 % IJ SOLN
INTRAMUSCULAR | Status: DC | PRN
  Administered 2020-08-14: 15 mL via INTRADERMAL

## 2020-08-14 MED ORDER — ONDANSETRON HCL 4 MG/2ML IJ SOLN: 4.0000 mg | Freq: Four times a day (QID) | INTRAMUSCULAR | Status: DC | PRN

## 2020-08-14 SURGICAL SUPPLY — 9 items
CATH ANGIO 5F PIGTAIL 65CM (CATHETERS) ×1 IMPLANT
KIT PV (KITS) ×2 IMPLANT
MAT PREVALON FULL STRYKER (MISCELLANEOUS) ×1 IMPLANT
SHEATH PINNACLE 5F 10CM (SHEATH) ×1 IMPLANT
SHEATH PROBE COVER 6X72 (BAG) ×1 IMPLANT
SYR MEDRAD MARK V 150ML (SYRINGE) ×1 IMPLANT
TRANSDUCER W/STOPCOCK (MISCELLANEOUS) ×2 IMPLANT
TRAY PV CATH (CUSTOM PROCEDURE TRAY) ×2 IMPLANT
WIRE HITORQ VERSACORE ST 145CM (WIRE) ×1 IMPLANT

## 2020-08-14 NOTE — Progress Notes (Signed)
Both extremities evaluated.  Patient is postoperative day #9 from left transmetatarsal amputation by [name].  The skin edges are dusky in appearance and macerated.  On the right foot there are 2 areas of dry eschar on the dorsal toes.  See images depicted below.  We will plan to do a right femoral puncture to make sure that we have perfusion to the left transmetatarsal amputation to optimize this.  Plan would be to return to treat the right leg if necessary in the future.         Ruta Hinds, MD [facility] Office: [phone]

## 2020-08-14 NOTE — IPOC Note (Signed)
Overall Plan of Care [facility]) Patient Details Name: [name] MRN: [id number] DOB: [birth year]  Admitting Diagnosis: S/P transmetatarsal amputation of foot, right [facility])  Hospital Problems: Principal Problem:   S/P transmetatarsal amputation of foot, right ([place])     Functional Problem List: Nursing Behavior, Edema, Endurance, Medication Management, Pain, Safety, Skin Integrity  PT Balance, Endurance, Motor, Skin Integrity  OT Balance, Edema, Endurance, Motor, Safety, Skin Integrity  SLP    TR         Basic ADL's: OT Bathing, Grooming, Dressing, Toileting     Advanced  ADL's: OT       Transfers: PT Bed Mobility, Bed to Chair, Car, Manufacturing systems engineer, Metallurgist: PT Ambulation, Emergency planning/management officer     Additional Impairments: OT None  SLP        TR      Anticipated Outcomes Item Anticipated Outcome  Self Feeding no goal  Swallowing      Basic self-care  Supervision overall, Mod I toilet  Toileting  Mod I   Bathroom Transfers Mod I  Bowel/Bladder  N/A  Transfers  mod I  Locomotion  supervision short distance gait, mod I w/c  Communication     Cognition     Pain  <4  Safety/Judgment  Supervision and no falls   Therapy Plan: PT Intensity: Minimum of 1-2 x/day ,45 to 90 minutes PT Frequency: 5 out of 7 days PT Duration Estimated Length of Stay: 10-12 OT Intensity: Minimum of 1-2 x/day, 45 to 90 minutes OT Frequency: 5 out of 7 days OT Duration/Estimated Length of Stay: 10-12 days     Due to the current state of emergency, patients may not be receiving their 3-hours of Medicare-mandated therapy.   Team Interventions: Nursing Interventions Patient/Family Education, Disease Management/Prevention, Pain Management, Medication Management, Skin Care/Wound Management, Discharge Planning  PT interventions Ambulation/gait training, Balance/vestibular training, Community reintegration, Discharge planning, Disease management/prevention,  DME/adaptive equipment instruction, Functional mobility training, Neuromuscular re-education, Pain management, Patient/family education, Psychosocial support, Stair training, Therapeutic Activities, Therapeutic Exercise, UE/LE Strength taining/ROM, UE/LE Coordination activities, Wheelchair propulsion/positioning  OT Interventions Balance/vestibular training, Discharge planning, Pain management, Self Care/advanced ADL retraining, Therapeutic Activities, UE/LE Coordination activities, Visual/perceptual remediation/compensation, Therapeutic Exercise, Skin care/wound managment, Patient/family education, Functional mobility training, Disease mangement/prevention, Cognitive remediation/compensation, Academic librarian, Engineer, drilling, Neuromuscular re-education, Psychosocial support, UE/LE Strength taining/ROM, Wheelchair propulsion/positioning, Splinting/orthotics  SLP Interventions    TR Interventions    SW/CM Interventions Discharge Planning, Psychosocial Support, Patient/Family Education, Disease Management/Prevention   Barriers to Discharge MD  Medical stability and Wound care  Nursing Decreased caregiver support, Home environment access/layout, New diabetic, Incontinence, Wound Care, Lack of/limited family support, Weight, Weight bearing restrictions, Medication compliance, Behavior Lives alone in 1 level home with stairs to enter. Will have someone available to provide min assist and can be available 24/7.  PT Inaccessible home environment, Decreased caregiver support, Home environment access/layout, Wound Care, Weight, Weight bearing restrictions    OT Home environment access/layout    SLP      [place] for SNF coverage, IV antibiotics, Wound Care     Team Discharge Planning: Destination: PT-Home ,OT- Home , SLP-  Projected Follow-up: PT-Home health PT, OT-  None, SLP-  Projected Equipment Needs: PT-Other (comment) (bariatric RW), OT- To be determined, SLP-   Equipment Details: PT- , OT-  Patient/family involved in discharge planning: PT- Patient,  OT-Patient, SLP-   MD ELOS: 12-14 days S Medical Rehab Prognosis:  Excellent Assessment:  [name] is a 65 year old man admitted to CIR with functional and mobility deficits secondary to gangrene/osteomyelitis of left foot which required a TMA. Course complicated by PAD requiring angiogram. Medications are being managed, and labs and vitals are being monitored regularly.      See Team Conference Notes for weekly updates to the plan of care

## 2020-08-14 NOTE — Op Note (Signed)
Procedure: Abdominal aortogram with bilateral lower extremity runoff  Preoperative diagnosis: Nonhealing wound both feet  Postoperative diagnosis: Nonhealing wounds bilateral feet  Anesthesia: Local with IV sedation, sedation time 33 minutes  Operative findings: Small vessel tibial disease no significant large vessel obstructive lesion requiring intervention  Operative details: After obtaining informed consent, the patient taken the PV lab.  Patient was placed in supine position angio table.  Both groins were prepped and draped usual sterile fashion.  Ultrasound was used identify the right common femoral artery and femoral bifurcation.  Ultrasound was used to locate the right common femoral artery and also fluoroscopy was used to identify the area of the right femoral head.  Local anesthesia was demonstrated of the right common femoral artery.  Introducer needle was used to cannulate right common femoral artery and 035 versa core wire threaded in the abdominal aorta under fluoroscopic guidance.  Next a 5 French sheath placed over the guidewire into the right common femoral artery.  This was thoroughly flushed with heparinized saline.  5 French pigtail catheter was then advanced over the guidewire into the abdominal aorta.  Abdominal aortogram was obtained in AP projection.  Left and right renal arteries are patent.  The infrarenal abdominal aorta is patent.  The left and right common external and internal iliac arteries are all widely patent.  Next bilateral extremity runoff views were obtained through the pigtail catheter.  In the left lower extremity, left common femoral profunda femoris superficial femoral-popliteal arteries are all patent.  All 3 tibial vessels are patent to the level of the ankle.  The posterior tibial artery is the dominant runoff vessel to the left foot.  Is patent throughout its course.  The peroneal artery is small but does give off an anterior and posterior communicating  branch.  The anterior tibial artery occludes briefly in the top of the foot and then the dorsalis pedis artery reconstitutes.  This is a very short segment less than 1-1/2 cm occlusion.  In the right lower extremity the right common femoral artery is patent.  The right superficial femoral profunda femoris artery and popliteal arteries are all patent.  There is three-vessel runoff to the right foot.  There is sluggish filling of the anterior tibial artery and this initially fills distally and into the DP via collaterals but then the proximal aspect does eventually fill as well.  Additional views of the right lateral foot were obtained through the sheath on the right side.  This confirmed the above findings.  The patient tolerated procedure well and there were no complications.  The patient taken the holding area in stable condition.  Operative management: Patient has inline flow to both feet with no significant flow-limiting lesion that would be amenable to percutaneous or open revascularization.  He does have an element of small vessel disease and has a short segment occlusion of the distal anterior tibial/dorsalis pedis artery on the left foot but does have posterior tibial artery flow.  Would recommend management of wounds with amputation of toes 2 and 3 on the right foot if the wounds do not heal and deteriorate.  Transmetatarsal amputation on the left side has as good a blood supply as it is going to get and hopefully this will heal in the future.  Ruta Hinds, MD [facility] [place] Office: [phone]

## 2020-08-14 NOTE — Progress Notes (Signed)
Site area: rt groin fa sheath Site Prior to Removal:  Level 0 Pressure Applied For: 20 minutes Manual:   yes Patient Status During Pull:  stable Post Pull Site:  Level 0 Post Pull Instructions Given:  yes Post Pull Pulses Present: rt dp dopplered Dressing Applied:  gauze and tegaderm Bedrest begins @ 1520 Comments:

## 2020-08-14 NOTE — Progress Notes (Addendum)
PROGRESS NOTE   Subjective/Complaints: [name] had no complaints this morning. Discussed aortogram today- discussed with patient and Dr. Donzetta Matters trialing nitroglycerin patch to improve blood circulation in foot and both are agreeable, Dr. Donzetta Matters recommended to clear with podiatry first- I will message Dr. Jacqualyn Posey.   ROS: denies insomnia, pain   Objective:   No results found. Recent Labs    08/12/20 0459  WBC 8.1  HGB 12.2*  HCT 37.1*  PLT 264   Recent Labs    08/12/20 0459  NA 137  K 3.5  CL 98  CO2 28  GLUCOSE 106*  BUN 10  CREATININE 0.80  CALCIUM 8.8*    Intake/Output Summary (Last 24 hours) at 08/14/2020 1303 Last data filed at 08/14/2020 [id number] Gross per 24 hour  Intake 240 ml  Output 2025 ml  Net -1785 ml     Pressure Injury 08/14/20 Buttocks Left;Medial Stage 2 -  Partial thickness loss of dermis presenting as a shallow open injury with a red, pink wound bed without slough. 1.4 by 0.4 (Active)  08/14/20 0210  Location: Buttocks  Location Orientation: Left;Medial  Staging: Stage 2 -  Partial thickness loss of dermis presenting as a shallow open injury with a red, pink wound bed without slough.  Wound Description (Comments): 1.4 by 0.4  Present on Admission: -- (unknown)    Physical Exam: Vital Signs Blood pressure (!) 135/51, pulse 65, temperature 98.3 F (36.8 C), temperature source Oral, resp. rate 18, height '5\' 11"'$  (1.803 m), weight (!) 150.8 kg, SpO2 96 %. Gen: no distress, normal appearing HEENT: oral mucosa pink and moist, NCAT Cardio: Reg rate Chest: normal effort, normal rate of breathing Abd: soft, non-distended Ext: no edema Psych: pleasant, normal affect Skin:    Comments: Chronic vascular changes in bilateral legs. Calcified plaques on the dorsum of right foot, toes as well as right ankle. Left TMA dressed  Neurological:    Comments: Alert and oriented x 3. Normal insight and awareness.  Intact Memory. Normal language and speech. Cranial nerve exam unremarkable. UE 5/5. Able to lift left leg off bed and move ankle. RLE 3-4/5 prox to distal. Stocking glove sensory loss below the ankles bilaterally. DTR's 1+  Psychiatric:        Mood and Affect: Mood normal.        Behavior: Behavior normal.        Thought Content: Thought content normal.    Assessment/Plan: 1. Functional deficits which require 3+ hours per day of interdisciplinary therapy in a comprehensive inpatient rehab setting. Physiatrist is providing close team supervision and 24 hour management of active medical problems listed below. Physiatrist and rehab team continue to assess barriers to discharge/monitor patient progress toward functional and medical goals  Care Tool:  Bathing    Body parts bathed by patient: Right arm, Left arm, Chest, Abdomen, Front perineal area, Right upper leg, Left upper leg, Face, Buttocks   Body parts bathed by helper: Right lower leg, Left lower leg     Bathing assist Assist Level: Minimal Assistance - Patient > 75%     Upper Body Dressing/Undressing Upper body dressing   What is the patient wearing?: Pull  over shirt    Upper body assist Assist Level: Set up assist    Lower Body Dressing/Undressing Lower body dressing      What is the patient wearing?: Underwear/pull up     Lower body assist Assist for lower body dressing: Maximal Assistance - Patient 25 - 49%     Toileting Toileting    Toileting assist Assist for toileting: Independent with assistive device Assistive Device Comment: Urinal   Transfers Chair/bed transfer  Transfers assist     Chair/bed transfer assist level: Moderate Assistance - Patient 50 - 74% Chair/bed transfer assistive device: Museum/gallery exhibitions officer assist      Assist level: 2 helpers Assistive device: Walker-rolling Max distance: 96f   Walk 10 feet activity   Assist  Walk 10 feet activity did not  occur: Safety/medical concerns  Assist level: Moderate Assistance - Patient - 50 - 74% Assistive device: Walker-Eva   Walk 50 feet activity   Assist Walk 50 feet with 2 turns activity did not occur: Safety/medical concerns         Walk 150 feet activity   Assist Walk 150 feet activity did not occur: Safety/medical concerns         Walk 10 feet on uneven surface  activity   Assist Walk 10 feet on uneven surfaces activity did not occur: Safety/medical concerns         Wheelchair     Assist   Type of Wheelchair: Manual    Wheelchair assist level: Supervision/Verbal cueing Max wheelchair distance: 100 ft    Wheelchair 50 feet with 2 turns activity    Assist        Assist Level: Supervision/Verbal cueing   Wheelchair 150 feet activity     Assist      Assist Level: Minimal Assistance - Patient > 75%   Blood pressure (!) 135/51, pulse 65, temperature 98.3 F (36.8 C), temperature source Oral, resp. rate 18, height '5\' 11"'$  (1.803 m), weight (!) 150.8 kg, SpO2 96 %.  Medical Problem List and Plan: 1.  Functional and mobility deficits secondary to gangrene/osteomyelitis of left foot which required a TMA             -patient may shower if left foot is covered             -ELOS/Goals: 12-14 days, supervision goals with PT and OT             -pt is NWB with CAM Boot LLE  -Continue CIR 2.  Antithrombotics: -DVT/anticoagulation:  Pharmaceutical: Other (comment) Eliquis             -antiplatelet therapy: N/A 3. Postsurgical pain: decrease Oxycodone to 1 tablet q4H prn. Provided with list of foods that can help with pain 4. Mood: LCSW to follow for evaluation and support.              -antipsychotic agents: N/A 5. Neuropsych: This patient is capable of making decisions on his own behalf. 6. Post-surgical TMA incision care: continue BID dressing changes.  7. Hypokalemia: continue 274m daily supplement 8. T2DM: Hgb A1c-6.4.  Continue Levemir.  Excellent control: d/c ISS. Continue to check CBGs AC/HS so we know if we need to add oral medication or increase insulin. Kidney function normal- can discuss starting metformin.  9. Left foot gangrene TO continue doxycycline and Augmentin-->end date 08/14.             --continue vitamin C and Zinc. Continue Juven  to promote wound healing.    -received clearance from Dr. Jacqualyn Posey and Dr. Donzetta Matters to trial nitroglycerin patch to left foot- discussed with patient side effect of headache 10. Chronic systolic CHF: Monitor for signs of overload.  Need daily weights --Continue Cozaar, coreg, Lipitor and Demadex. 11. CAF: Monitor HR TID--on Eliquis and Coreg for rate control.  12. Diabetic peripheral neuropathy: scheduled for outpatient [place]za 9/26. Discussed risks and benefits with patient 13. Disposition: patient will require overnight monitoring after procedure and can be readmitted tomorrow morning.    LOS: 3 days A FACE TO FACE EVALUATION WAS PERFORMED  Zissy Hamlett P Fredrico Beedle 08/14/2020, 1:03 PM

## 2020-08-14 NOTE — Progress Notes (Signed)
Physical Therapy Session Note  Patient Details  Name: [name] MRN: [id number] Date of Birth: 1955/03/26  Today's Date: 08/14/2020 PT Co-Treatment Time:  -     Short Term Goals: Week 1:  PT Short Term Goal 1 (Week 1): Pt will be able to maintain WB precautions with gait up to 20 ft. PT Short Term Goal 2 (Week 1): Pt will initiate stair training. PT Short Term Goal 3 (Week 1): Pt will tolerate up to 30 min of activity.  Skilled Therapeutic Interventions/Progress Updates:    Pt off-unit at scheduled therapy time. Pt missed 30 minutes of skilled PT intervention.   Therapy Documentation Precautions:  Precautions Precautions: Fall Precaution Comments: venous stasis, NWB LLE w/ DARCO Required Braces or Orthoses: Other Brace Other Brace: L darco shoe Restrictions Weight Bearing Restrictions: Yes LLE Weight Bearing: Non weight bearing Other Position/Activity Restrictions: Pt has difficulty maintaining weight bearing in standing and fatigues quickly. General: PT Amount of Missed Time (min): 30 Minutes PT Missed Treatment Reason: Unavailable (Comment) (pt off unit)    Therapy/Group: Individual Therapy  Mickel Fuchs 08/14/2020, 2:55 PM

## 2020-08-14 NOTE — Interval H&P Note (Signed)
History and Physical Interval Note:  08/14/2020 1:03 PM  [name]  has presented today for surgery, with the diagnosis of foot gangrene.  The various methods of treatment have been discussed with the patient and family. After consideration of risks, benefits and other options for treatment, the patient has consented to  Procedure(s): ABDOMINAL AORTOGRAM W/LOWER EXTREMITY (N/A) as a surgical intervention.  The patient's history has been reviewed, patient examined, no change in status, stable for surgery.  I have reviewed the patient's chart and labs.  Questions were answered to the patient's satisfaction.     Ruta Hinds

## 2020-08-14 NOTE — Progress Notes (Signed)
Physical Therapy Session Note  Patient Details  Name: [name] MRN: [id number] Date of Birth: [birth year]  Today's Date: 08/14/2020 PT Individual Time:  -    PT Amount of Missed Time (min): 30 Minutes PT Missed Treatment Reason: MD hold (Comment) (amputation pending)  Short Term Goals: Week 1:  PT Short Term Goal 1 (Week 1): Pt will be able to maintain WB precautions with gait up to 20 ft. PT Short Term Goal 2 (Week 1): Pt will initiate stair training. PT Short Term Goal 3 (Week 1): Pt will tolerate up to 30 min of activity.  Skilled Therapeutic Interventions/Progress Updates:    MD hold pending scheduled surgery for amputation.   Therapy Documentation Precautions:  Precautions Precautions: Fall Precaution Comments: venous stasis, NWB LLE w/ DARCO Required Braces or Orthoses: Other Brace Other Brace: L darco shoe Restrictions Weight Bearing Restrictions: Yes LLE Weight Bearing: Non weight bearing Other Position/Activity Restrictions: Pt has difficulty maintaining weight bearing in standing and fatigues quickly. General: PT Amount of Missed Time (min): 30 Minutes PT Missed Treatment Reason: MD hold (Comment) (amputation pending)   Therapy/Group: Individual Therapy  Mysha Peeler, SPT  08/14/2020, 9:33 AM

## 2020-08-14 NOTE — Progress Notes (Signed)
Occupational Therapy Session Note  Patient Details  Name: [name] MRN: [id number] Date of Birth: [birth year]  Today's Date: 08/14/2020 OT Individual Time: 1135-1200 OT Individual Time Calculation (min): 25 min    Short Term Goals: Week 1:  OT Short Term Goal 1 (Week 1): Pt will complete BSC/toilet transfer with LRAD and good precaution adherence with Min A OT Short Term Goal 2 (Week 1): Pt will perform LB dress w/ AE PRN with Min A OT Short Term Goal 3 (Week 1): Pt will perform bathing tasks with no more than Min A with AE PRN OT Short Term Goal 4 (Week 1): Pt will perform sit <> stands in prep for ADL with Min A  Skilled Therapeutic Interventions/Progress Updates:    Pt received EOB, ready to transfer to toilet d/t urgent BM needs. Pt completed stand pivot to [facility] with RW, with poor adherence to NWB precautions with CGA. He was able to manage clothing before sitting. Very loose BM voided. Pt required mod A for hygiene following, in standing. Pt changed into a gown in prep for procedure this afternoon. He transferred back to bed with min A and was left supine with all needs met. Bed alarm set.   Therapy Documentation Precautions:  Precautions Precautions: Fall Precaution Comments: venous stasis, NWB LLE w/ DARCO Required Braces or Orthoses: Other Brace Other Brace: L darco shoe Restrictions Weight Bearing Restrictions: Yes LLE Weight Bearing: Non weight bearing Other Position/Activity Restrictions: Pt has difficulty maintaining weight bearing in standing and fatigues quickly.    Therapy/Group: Individual Therapy  Curtis Sites 08/14/2020, 12:42 PM

## 2020-08-15 DIAGNOSIS — I110 Hypertensive heart disease with heart failure: Secondary | ICD-10-CM | POA: Diagnosis not present

## 2020-08-15 DIAGNOSIS — M6281 Muscle weakness (generalized): Secondary | ICD-10-CM | POA: Diagnosis not present

## 2020-08-15 DIAGNOSIS — E119 Type 2 diabetes mellitus without complications: Secondary | ICD-10-CM | POA: Diagnosis not present

## 2020-08-15 DIAGNOSIS — Z87891 Personal history of nicotine dependence: Secondary | ICD-10-CM | POA: Diagnosis not present

## 2020-08-15 DIAGNOSIS — I739 Peripheral vascular disease, unspecified: Secondary | ICD-10-CM | POA: Diagnosis present

## 2020-08-15 DIAGNOSIS — Z7901 Long term (current) use of anticoagulants: Secondary | ICD-10-CM | POA: Diagnosis not present

## 2020-08-15 DIAGNOSIS — Z8616 Personal history of COVID-19: Secondary | ICD-10-CM | POA: Diagnosis not present

## 2020-08-15 DIAGNOSIS — I5021 Acute systolic (congestive) heart failure: Secondary | ICD-10-CM | POA: Diagnosis not present

## 2020-08-15 DIAGNOSIS — I4891 Unspecified atrial fibrillation: Secondary | ICD-10-CM | POA: Diagnosis not present

## 2020-08-15 DIAGNOSIS — Z91040 Latex allergy status: Secondary | ICD-10-CM | POA: Diagnosis not present

## 2020-08-15 LAB — BASIC METABOLIC PANEL
Anion gap: 9 (ref 5–15)
BUN: 10 mg/dL (ref 8–23)
CO2: 27 mmol/L (ref 22–32)
Calcium: 9.3 mg/dL (ref 8.9–10.3)
Chloride: 101 mmol/L (ref 98–111)
Creatinine, Ser: 0.69 mg/dL (ref 0.61–1.24)
GFR, Estimated: >60 mL/min (ref >60)
Glucose, Bld: 118 mg/dL — ABNORMAL HIGH (ref 70–99)
Potassium: 3.3 mmol/L — ABNORMAL LOW (ref 3.5–5.1)
Sodium: 137 mmol/L (ref 135–145)

## 2020-08-15 LAB — CBC
HCT: 41.5 % (ref 39.0–52.0)
Hemoglobin: 13.3 g/dL (ref 13.0–17.0)
MCH: 30.6 pg (ref 26.0–34.0)
MCHC: 32 g/dL (ref 30.0–36.0)
MCV: 95.4 fL (ref 80.0–100.0)
Platelets: 345 10*3/uL (ref 150–400)
RBC: 4.35 MIL/uL (ref 4.22–5.81)
RDW: 14.8 % (ref 11.5–15.5)
WBC: 10.1 10*3/uL (ref 4.0–10.5)
nRBC: 0 % (ref 0.0–0.2)

## 2020-08-15 MED ORDER — SERTRALINE HCL 50 MG PO TABS
50.0000 mg | ORAL_TABLET | Freq: Every day | ORAL | Status: DC
  Administered 2020-08-15 – 2020-08-18 (×4): 50 mg via ORAL
  Filled 2020-08-15 (×4): qty 1

## 2020-08-15 MED ORDER — DOXYCYCLINE HYCLATE 100 MG PO TABS
100.0000 mg | ORAL_TABLET | Freq: Two times a day (BID) | ORAL | Status: DC
  Administered 2020-08-15 – 2020-08-18 (×6): 100 mg via ORAL
  Filled 2020-08-15 (×6): qty 1

## 2020-08-15 MED ORDER — ALBUTEROL SULFATE (2.5 MG/3ML) 0.083% IN NEBU: 3.0000 mL | INHALATION_SOLUTION | Freq: Four times a day (QID) | RESPIRATORY_TRACT | Status: DC | PRN

## 2020-08-15 MED ORDER — AMOXICILLIN-POT CLAVULANATE 875-125 MG PO TABS
1.0000 | ORAL_TABLET | Freq: Two times a day (BID) | ORAL | Status: DC
  Administered 2020-08-15 – 2020-08-18 (×6): 1 via ORAL
  Filled 2020-08-15 (×6): qty 1

## 2020-08-15 MED ORDER — APIXABAN 5 MG PO TABS
5.0000 mg | ORAL_TABLET | Freq: Two times a day (BID) | ORAL | Status: DC
  Administered 2020-08-15 – 2020-08-18 (×6): 5 mg via ORAL
  Filled 2020-08-15 (×6): qty 1

## 2020-08-15 MED ORDER — POTASSIUM CHLORIDE CRYS ER 20 MEQ PO TBCR
20.0000 meq | EXTENDED_RELEASE_TABLET | Freq: Every day | ORAL | Status: DC
  Administered 2020-08-15 – 2020-08-18 (×4): 20 meq via ORAL
  Filled 2020-08-15 (×4): qty 1

## 2020-08-15 MED ORDER — TORSEMIDE 20 MG PO TABS
20.0000 mg | ORAL_TABLET | Freq: Every day | ORAL | Status: DC
  Administered 2020-08-16 – 2020-08-18 (×3): 20 mg via ORAL
  Filled 2020-08-15 (×4): qty 1

## 2020-08-15 MED ORDER — HYDROCODONE-ACETAMINOPHEN 10-325 MG PO TABS
1.0000 | ORAL_TABLET | Freq: Four times a day (QID) | ORAL | Status: DC | PRN
  Administered 2020-08-16 (×2): 1 via ORAL
  Filled 2020-08-15 (×2): qty 1

## 2020-08-15 MED ORDER — ACETAMINOPHEN 325 MG PO TABS: 650.0000 mg | ORAL_TABLET | Freq: Four times a day (QID) | ORAL | Status: DC | PRN

## 2020-08-15 MED ORDER — ATORVASTATIN CALCIUM 10 MG PO TABS
20.0000 mg | ORAL_TABLET | Freq: Every day | ORAL | Status: DC
  Administered 2020-08-15 – 2020-08-18 (×4): 20 mg via ORAL
  Filled 2020-08-15 (×4): qty 2

## 2020-08-15 MED ORDER — CARVEDILOL 12.5 MG PO TABS
12.5000 mg | ORAL_TABLET | Freq: Two times a day (BID) | ORAL | Status: DC
  Administered 2020-08-16: 12.5 mg via ORAL
  Filled 2020-08-15 (×4): qty 1

## 2020-08-15 MED ORDER — PANTOPRAZOLE SODIUM 40 MG PO TBEC
40.0000 mg | DELAYED_RELEASE_TABLET | Freq: Every day | ORAL | Status: DC
  Administered 2020-08-15 – 2020-08-18 (×4): 40 mg via ORAL
  Filled 2020-08-15 (×4): qty 1

## 2020-08-15 MED ORDER — LOSARTAN POTASSIUM 50 MG PO TABS
50.0000 mg | ORAL_TABLET | Freq: Every day | ORAL | Status: DC
  Administered 2020-08-15 – 2020-08-18 (×4): 50 mg via ORAL
  Filled 2020-08-15 (×3): qty 1

## 2020-08-15 NOTE — Evaluation (Signed)
Physical Therapy Evaluation Patient Details Name: [name] MRN: [id number] DOB: 01-30-1955 Today's Date: 08/15/2020   History of Present Illness  65yo male admitted back to acute care 08/13/20 from [facility] for possible further interventions at the acute L TMA site as well as possible amputation of R 2nd and 3rd toes. PMH heart failure, A-fib, DM, HLD, gout, HTN, L TMA 08/05/20  Clinical Impression   Patient received in bed, pleasant and motivated to return to CIR. Able to perform bed mobility well but does continue to need as much as ModA for functional transfers with inconsistent ability to maintain NWB L LE. Able to perform lateral scoots while maintaining NWB L LE with min guard, however. Had to have multiple BMS while I was in the room, unfortunately no bariatric BSC was available (even from facilities) so we had to get on the bedpan today. Left in bed on bedpan with all needs met, nursing staff aware of patient status. Recommend return to CIR.     Follow Up Recommendations CIR    Equipment Recommendations  Rolling walker with 5" wheels;Wheelchair (measurements PT);Wheelchair cushion (measurements PT);3in1 (PT) (bariatric)    Recommendations for Other Services       Precautions / Restrictions Precautions Precautions: Fall Precaution Comments: venous stasis, NWB LLE w/ DARCO Required Braces or Orthoses: Other Brace Other Brace: L darco shoe Restrictions Weight Bearing Restrictions: Yes LLE Weight Bearing: Non weight bearing Other Position/Activity Restrictions: Pt has difficulty maintaining weight bearing in standing and fatigues quickly.      Mobility  Bed Mobility Overal bed mobility: Needs Assistance Bed Mobility: Rolling;Supine to Sit;Sit to Supine Rolling: Supervision   Supine to sit: Supervision Sit to supine: Min guard   General bed mobility comments: S-min guard for safety, use of rails for rolling and pulling up to EOB    Transfers Overall transfer level: Needs  assistance Equipment used: Rolling walker (2 wheeled) Transfers: Sit to/from Stand;Lateral/Scoot Transfers Sit to Stand: Mod assist        Lateral/Scoot Transfers: Min guard General transfer comment: on first attempt needed Curry to boost all the way to upright standing, but able to perform with MinA on second attempt. Inconsistent with maintaining WB status L LE. Able to perform lateral scoots up EOB with min guard while maintaining NWB L LE  Ambulation/Gait             General Gait Details: deferred  Stairs            Wheelchair Mobility    Modified Rankin (Stroke Patients Only)       Balance Overall balance assessment: Needs assistance Sitting-balance support: Bilateral upper extremity supported;Feet supported Sitting balance-Leahy Scale: Good     Standing balance support: Bilateral upper extremity supported Standing balance-Leahy Scale: Poor                               Pertinent Vitals/Pain Pain Assessment: No/denies pain Faces Pain Scale: No hurt    Home Living Family/patient expects to be discharged to:: Private residence Living Arrangements: Alone Available Help at Discharge: Friend(s);Available PRN/intermittently;Family Type of Home: House Home Access: Stairs to enter Entrance Stairs-Rails: None Entrance Stairs-Number of Steps: 1 Home Layout: Bed/bath upstairs;Able to live on main level with bedroom/bathroom Home Equipment: Gilford Rile - 2 wheels;Cane - single point      Prior Function Level of Independence: Independent with assistive device(s)         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SPC,  working as hairdresser     Journalist, newspaper   Dominant Hand: Right    Extremity/Trunk Assessment   Upper Extremity Assessment Upper Extremity Assessment: Defer to OT evaluation    Lower Extremity Assessment Lower Extremity Assessment: Generalized weakness LLE Deficits / Details: NWB L LE, did not assess MMT on this side LLE Coordination: decreased gross  motor    Cervical / Trunk Assessment Cervical / Trunk Assessment: Normal  Communication   Communication: No difficulties  Cognition Arousal/Alertness: Awake/alert Behavior During Therapy: WFL for tasks assessed/performed Overall Cognitive Status: Within Functional Limits for tasks assessed                                 General Comments: Pt is motivated to get better and home      General Comments      Exercises     Assessment/Plan    PT Assessment Patient needs continued PT services  PT Problem List Decreased strength;Decreased range of motion;Decreased activity tolerance;Decreased balance;Decreased mobility;Decreased skin integrity;Pain;Decreased knowledge of use of DME;Decreased safety awareness       PT Treatment Interventions DME instruction;Gait training;Functional mobility training;Therapeutic activities;Therapeutic exercise;Balance training;Neuromuscular re-education;Patient/family education    PT Goals (Current goals can be found in the Care Plan section)  Acute Rehab PT Goals Patient Stated Goal: go back to rehab PT Goal Formulation: With patient Time For Goal Achievement: 08/21/20 Potential to Achieve Goals: Good    Frequency Min 3X/week   Barriers to discharge        Co-evaluation               AM-PAC PT "6 Clicks" Mobility  Outcome Measure Help needed turning from your back to your side while in a flat bed without using bedrails?: None Help needed moving from lying on your back to sitting on the side of a flat bed without using bedrails?: A Little Help needed moving to and from a bed to a chair (including a wheelchair)?: A Little Help needed standing up from a chair using your arms (e.g., wheelchair or bedside chair)?: A Lot Help needed to walk in hospital room?: A Lot Help needed climbing 3-5 steps with a railing? : Total 6 Click Score: 15    End of Session Equipment Utilized During Treatment: Gait belt Activity Tolerance:  Patient tolerated treatment well Patient left: in bed;with call bell/phone within reach;Other (comment) (on bedpan, NT aware) Nurse Communication: Mobility status PT Visit Diagnosis: Pain;Difficulty in walking, not elsewhere classified (R26.2);Muscle weakness (generalized) (M62.81);Other abnormalities of gait and mobility (R26.89) Pain - Right/Left: Right Pain - part of body: Ankle and joints of foot    Time: 4174-0814 PT Time Calculation (min) (ACUTE ONLY): 30 min   Charges:   PT Evaluation $PT Eval Moderate Complexity: 1 Mod PT Treatments $Therapeutic Activity: 8-22 mins       Windell Norfolk, DPT, PN2   Supplemental Physical Therapist [place]    Pager [phone] Acute Rehab Office [phone]

## 2020-08-15 NOTE — Progress Notes (Signed)
Inpatient Rehab Admissions Coordinator:   I will place CIR consult and work toward getting insurance auth so that Pt. Can return to CIR. Pt. Will need new PT/OT evals in order to get insurance auth.   Clemens Catholic, [place], [place] Admissions Coordinator  [phone] ([place]) [phone] (office)

## 2020-08-15 NOTE — Evaluation (Signed)
Occupational Therapy Evaluation Patient Details Name: [name] MRN: [id number] DOB: 1955-07-05 Today's Date: 08/15/2020    History of Present Illness 65yo male admitted back to acute care 08/13/20 from [facility] for possible further interventions at the acute L TMA site as well as possible amputation of R 2nd and 3rd toes. PMH heart failure, A-fib, DM, HLD, gout, HTN, L TMA 08/05/20   Clinical Impression   Pt admitted from CIR, where he was working towards independence with ADL's and functional mobility. At this time, pt continues to require assist for functional mobility and ADL's. Pt demonstrates the most need due to weight bearing status and balance. He requires max A for all LB ADL's and mod A for sit<>stands and stand pivot transfers. He will benefit from further intensive rehab at [facility] to return to his baseline and safely return home. OT will follow acutely.     Follow Up Recommendations  CIR;Supervision/Assistance - 24 hour    Equipment Recommendations  3 in 1 bedside commode;Tub/shower seat;Wheelchair (measurements OT)    Recommendations for Other Services Rehab consult     Precautions / Restrictions Precautions Precautions: Fall Precaution Comments: venous stasis, NWB LLE w/ DARCO Required Braces or Orthoses: Other Brace Other Brace: L darco shoe Restrictions Weight Bearing Restrictions: Yes LLE Weight Bearing: Non weight bearing Other Position/Activity Restrictions: Pt has difficulty maintaining weight bearing in standing and fatigues quickly.      Mobility Bed Mobility Overal bed mobility: Needs Assistance Bed Mobility: Rolling;Supine to Sit;Sit to Supine Rolling: Supervision   Supine to sit: Supervision Sit to supine: Supervision   General bed mobility comments: Sup for safety, increased time and use of bed rails    Transfers Overall transfer level: Needs assistance Equipment used: Rolling walker (2 wheeled) Transfers: Sit to/from Stand;Lateral/Scoot  Transfers Sit to Stand: Mod assist        Lateral/Scoot Transfers: Min guard General transfer comment: on first attempt needed [place] to boost all the way to upright standing, but able to perform with MinA on second attempt. Inconsistent with maintaining WB status L LE. Able to perform lateral scoots up EOB with min guard while maintaining NWB L LE    Balance Overall balance assessment: Needs assistance Sitting-balance support: Bilateral upper extremity supported;Feet supported Sitting balance-Leahy Scale: Good     Standing balance support: Bilateral upper extremity supported Standing balance-Leahy Scale: Poor                             ADL either performed or assessed with clinical judgement   ADL Overall ADL's : Needs assistance/impaired Eating/Feeding: Set up;Sitting   Grooming: Set up;Sitting   Upper Body Bathing: Set up;Sitting   Lower Body Bathing: Maximal assistance;Sit to/from stand   Upper Body Dressing : Set up;Sitting   Lower Body Dressing: Maximal assistance;Sit to/from stand   Toilet Transfer: Moderate assistance;Stand-pivot   Toileting- Clothing Manipulation and Hygiene: Maximal assistance;Sit to/from stand   Tub/ Banker: Moderate assistance;Stand-pivot   Functional mobility during ADLs: Moderate assistance;Rolling walker General ADL Comments: Pt requiring mod A for all transfers and set up for UB ADL's and Max A for LB ADL's when having to complete sit<>stands and maintaining balance     Vision Baseline Vision/History: No visual deficits Patient Visual Report: No change from baseline Vision Assessment?: No apparent visual deficits     Perception Perception Perception Tested?: No   Praxis Praxis Praxis tested?: Not tested    Pertinent Vitals/Pain Pain  Assessment: No/denies pain Faces Pain Scale: No hurt     Hand Dominance Right   Extremity/Trunk Assessment Upper Extremity Assessment Upper Extremity Assessment: Overall  WFL for tasks assessed   Lower Extremity Assessment Lower Extremity Assessment: Defer to PT evaluation LLE Deficits / Details: NWB L LE, did not assess MMT on this side LLE Coordination: decreased gross motor   Cervical / Trunk Assessment Cervical / Trunk Assessment: Normal   Communication Communication Communication: No difficulties   Cognition Arousal/Alertness: Awake/alert Behavior During Therapy: WFL for tasks assessed/performed Overall Cognitive Status: Within Functional Limits for tasks assessed                                 General Comments: Pt is motivated to get better and home   General Comments  VSS on RA, HR 51-58, O2 92-95%    Exercises     Shoulder Instructions      Home Living Family/patient expects to be discharged to:: Private residence Living Arrangements: Alone Available Help at Discharge: Friend(s);Available PRN/intermittently;Family Type of Home: House Home Access: Stairs to enter [employer] of Steps: 1 Entrance Stairs-Rails: None Home Layout: Bed/bath upstairs;Able to live on main level with bedroom/bathroom     Bathroom Shower/Tub: Tub/shower unit;Walk-in shower   Bathroom Toilet: Standard Bathroom Accessibility: Yes How Accessible: Accessible via walker Home Equipment: [place] - 2 wheels;Kasandra Knudsen - single point      Lives With: Alone    Prior Functioning/Environment Level of Independence: Independent with assistive device(s)        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[employer], working as hairdresser        OT Problem List: Decreased strength;Decreased range of motion;Decreased activity tolerance;Impaired balance (sitting and/or standing);Decreased safety awareness;Decreased knowledge of use of DME or AE;Decreased knowledge of precautions;Pain      OT Treatment/Interventions: Self-care/ADL training;Therapeutic exercise;DME and/or AE instruction;Cognitive remediation/compensation;Visual/perceptual remediation/compensation    OT  Goals(Current goals can be found in the care plan section) Acute Rehab OT Goals Patient Stated Goal: go back to rehab OT Goal Formulation: With patient Time For Goal Achievement: 08/29/20 Potential to Achieve Goals: Good  OT Frequency: Min 2X/week   Barriers to D/C:            Co-evaluation              AM-PAC OT "6 Clicks" Daily Activity     Outcome Measure Help from another person eating meals?: None Help from another person taking care of personal grooming?: A Little Help from another person toileting, which includes using toliet, bedpan, or urinal?: A Lot Help from another person bathing (including washing, rinsing, drying)?: A Lot Help from another person to put on and taking off regular upper body clothing?: A Little Help from another person to put on and taking off regular lower body clothing?: A Lot 6 Click Score: 16   End of Session Equipment Utilized During Treatment: Gait belt;Rolling walker Nurse Communication: Mobility status  Activity Tolerance: Patient tolerated treatment well Patient left: in bed;with call bell/phone within reach  OT Visit Diagnosis: Unsteadiness on feet (R26.81);Other abnormalities of gait and mobility (R26.89);Muscle weakness (generalized) (M62.81);Pain Pain - Right/Left: Left Pain - part of body: Ankle and joints of foot                Time: 1046-1110 OT Time Calculation (min): 24 min Charges:  OT General Charges $OT Visit: 1 Visit OT Evaluation $OT Eval Moderate Complexity: 1 Mod OT Treatments $  Self Care/Home Management : 8-22 mins  Nassim Cosma H., OTR/L Acute Rehabilitation  Somara Frymire Elane Yolanda Bonine 08/15/2020, 11:24 AM

## 2020-08-15 NOTE — Progress Notes (Signed)
Inpatient Rehab Admissions Coordinator:   Pt. Recently re-admitted to acute from CIR. I met with Pt. And he affirms that he wishes to return to CIR. I will work on [employer] production planner auth to re-admit him.  Clemens Catholic, [place], [employer] Admissions Coordinator  [phone] ([place]) [phone] (office)

## 2020-08-15 NOTE — Progress Notes (Signed)
[facility] [place] PROGRESS NOTE  ASSESSMENT / PLAN: [name] is a 65 y.o. male status post lower extremity angiogram for non-healing wounds of bilateral feet. No evidence of disease amenable to endovascular or open surgical therapy. Will plan to transfer back to inpatient rehab today.  SUBJECTIVE: No complaints. Sleeping with CPAP.  OBJECTIVE: BP (!) 156/88 (BP Location: Right Arm)   Pulse 61   Temp (!) 97.4 F (36.3 C) (Oral)   Resp 18   SpO2 95%   Intake/Output Summary (Last 24 hours) at 08/15/2020 [id number] Last data filed at 08/14/2020 2039 Gross per 24 hour  Intake --  Output 250 ml  Net -250 ml    Constitutional: Chronically ill appearing. Obese. No acute distress. Vascular: right groin soft  CBC Latest Ref Rng & Units 08/15/2020 08/12/2020 08/05/2020  WBC 4.0 - 10.5 K/uL 10.1 8.1 7.5  Hemoglobin 13.0 - 17.0 g/dL 13.3 12.2(L) 12.7(L)  Hematocrit 39.0 - 52.0 % 41.5 37.1(L) 40.1  Platelets 150 - 400 K/uL 345 264 243     CMP Latest Ref Rng & Units 08/15/2020 08/12/2020 08/09/2020  Glucose 70 - 99 mg/dL 118(H) 106(H) 101(H)  BUN 8 - 23 mg/dL '10 10 20  '$ Creatinine 0.61 - 1.24 mg/dL 0.69 0.80 1.00  Sodium 135 - 145 mmol/L 137 137 136  Potassium 3.5 - 5.1 mmol/L 3.3(L) 3.5 3.7  Chloride 98 - 111 mmol/L 101 98 104  CO2 22 - 32 mmol/L '27 28 22  '$ Calcium 8.9 - 10.3 mg/dL 9.3 8.8(L) 8.5(L)  Total Protein 6.5 - 8.1 g/dL - 6.4(L) -  Total Bilirubin 0.3 - 1.2 mg/dL - 0.6 -  Alkaline Phos 38 - 126 U/L - 96 -  AST 15 - 41 U/L - 19 -  ALT 0 - 44 U/L - 16 -    Estimated Creatinine Clearance: 137.4 mL/min (by C-G formula based on SCr of 0.69 mg/dL).   Yevonne Aline. Stanford Breed, MD [facility] Phone Number: [phone] 08/15/2020 9:21 AM

## 2020-08-15 NOTE — Discharge Summary (Addendum)
Discharge Summary  Patient ID: BENAJAMIN COPLAND [id number] 65 y.o. 12-Feb-1955  Admit date: 08/14/2020  Discharge date and time: 08/18/20   Admitting Physician: Elam Dutch, MD   Discharge Physician: Dr. Stanford Breed  Admission Diagnoses: PAD (peripheral artery disease) [facility]) [I73.9]  Discharge Diagnoses: same  Admission Condition: fair  Discharged Condition: fair  Indication for Admission: observation following angiogram  Hospital Course: Mr. Ryeland Calcagni is a 65 year old male with a slow to heal left TMA as well as ulcerations on the toes of right foot.  He underwent aortogram with bilateral lower extremity runoff by Dr. Oneida Alar on 08/14/2020.  He tolerated the procedure well and was kept in observation overnight.  Angiography did not show any significant flow-limiting lesions that would be amendable to percutaneous or open intervention.  Recommendations by Dr. Oneida Alar included amputations of toes 2 and 3 on the right as well as continued aggressive wound care of left TMA by podiatry.  He does not have a hematoma at the groin catheterization site today.  He is stable for discharge back to inpatient rehabilitation.  He remained hospitalized for the remainder awaiting insurance approval to return to CIR.  He did develop gout and was prescribed colchicine 0.'6mg'$  daily x 4 doses.    Consults: None  Treatments: surgery: Diagnostic aortogram with bilateral lower extremity runoff by Dr. Oneida Alar on 08/14/2020  Discharge Exam: groin cath site without hematoma Vitals:   08/15/20 0020 08/15/20 0437  BP:  (!) 156/88  Pulse:  61  Resp:  18  Temp: 97.6 F (36.4 C) (!) 97.4 F (36.3 C)  SpO2:  95%     Disposition: Discharge disposition: 90-DC/txfr to inpt rehab facility with planned acute care hosp IP admission       Patient Instructions:  Allergies as of 08/15/2020       Reactions   Latex Hives, Swelling        Medication List     TAKE these medications    acetaminophen 325 MG  tablet Commonly known as: TYLENOL Take 650 mg by mouth every 6 (six) hours as needed for moderate pain.   albuterol 108 (90 Base) MCG/ACT inhaler Commonly known as: VENTOLIN HFA Inhale 2 puffs into the lungs every 6 (six) hours as needed for wheezing or shortness of breath.   allopurinol 300 MG tablet Commonly known as: ZYLOPRIM Take 300 mg by mouth daily.   amoxicillin-clavulanate 875-125 MG tablet Commonly known as: Augmentin Take 1 tablet by mouth 2 (two) times daily for 14 days.   ascorbic acid 500 MG tablet Commonly known as: VITAMIN C Take 1 tablet (500 mg total) by mouth 3 (three) times daily.   atorvastatin 20 MG tablet Commonly known as: LIPITOR TAKE 1 TABLET BY MOUTH EVERY DAY *PT OVERDUE FOR OFFICE VISIT*   carvedilol 12.5 MG tablet Commonly known as: COREG Take 1 tablet (12.5 mg total) by mouth 2 (two) times daily.   cholecalciferol 25 MCG (1000 UNIT) tablet Commonly known as: VITAMIN D Take 1,000 Units by mouth daily.   [employer] Digestive Probiotic 250 MG capsule Generic drug: saccharomyces boulardii Take 500 mg by mouth daily.   doxycycline 100 MG tablet Commonly known as: VIBRA-TABS Take 1 tablet (100 mg total) by mouth 2 (two) times daily for 14 days.   Eliquis 5 MG Tabs tablet Generic drug: apixaban TAKE 1 TABLET BY MOUTH TWICE A DAY   HYDROcodone-acetaminophen 10-325 MG tablet Commonly known as: Norco Take 1 tablet by mouth every 6 (six) hours as needed  for up to 7 days.   Klor-Con M20 20 MEQ tablet Generic drug: potassium chloride SA TAKE 1 TABLET BY MOUTH EVERY DAY   losartan 50 MG tablet Commonly known as: COZAAR TAKE 1 TABLET BY MOUTH EVERY MORNING AND AT BEDTIME   Omega 3 340 MG Cpdr Take 1 tablet by mouth 2 (two) times daily.   omeprazole 40 MG capsule Commonly known as: PRILOSEC Take 40 mg by mouth daily.   sertraline 50 MG tablet Commonly known as: ZOLOFT Take 1 tablet (50 mg total) by mouth daily.   torsemide 20 MG  tablet Commonly known as: DEMADEX Take 2 tablets by mouth daily alternating with 1 tablet by mouth daily.   vitamin B-12 1000 MCG tablet Commonly known as: CYANOCOBALAMIN Take 1,000 mcg by mouth daily.       Activity: activity as tolerated Diet: regular diet Wound Care: keep wound clean and dry; as directed by Podiatry  Follow-up with [facility] as needed  Signed: Dagoberto Ligas, PA-C 08/15/2020 9:12 AM [facility] Office: [phone]

## 2020-08-15 NOTE — Plan of Care (Signed)
  Problem: Cardiovascular: Goal: Vascular access site(s) Level 0-1 will be maintained Outcome: Progressing   

## 2020-08-15 NOTE — Discharge Instructions (Signed)
° °  [facility] [place] ° °Discharge Instructions ° °Lower Extremity Angiogram; Angioplasty/Stenting ° °Please refer to the following instructions for your post-procedure care. Your surgeon or physician assistant will discuss any changes with you. ° °Activity ° °Avoid lifting more than 8 pounds (1 gallons of milk) for 72 hours (3 days) after your procedure. You may walk as much as you can tolerate. It's OK to drive after 72 hours. ° °Bathing/Showering ° °You may shower the day after your procedure. If you have a bandage, you may remove it at 24- 48 hours. Clean your incision site with mild soap and water. Pat the area dry with a clean towel. ° °Diet ° °Resume your pre-procedure diet. There are no special food restrictions following this procedure. All patients with peripheral vascular disease should follow a low fat/low cholesterol diet. In order to heal from your surgery, it is CRITICAL to get adequate nutrition. Your body requires vitamins, minerals, and protein. Vegetables are the best source of vitamins and minerals. Vegetables also provide the perfect balance of protein. Processed food has little nutritional value, so try to avoid this. ° °Medications ° °Resume taking all of your medications unless your doctor tells you not to. If your incision is causing pain, you may take over-the-counter pain relievers such as acetaminophen (Tylenol) ° °Follow Up ° °Follow up will be arranged at the time of your procedure. You may have an office visit scheduled or may be scheduled for surgery. Ask your surgeon if you have any questions. ° °Please call us immediately for any of the following conditions: °•Severe or worsening pain your legs or feet at rest or with walking. °•Increased pain, redness, drainage at your groin puncture site. °•Fever of 101 degrees or higher. °•If you have any mild or slow bleeding from your puncture site: lie down, apply firm constant pressure over the area with a piece of  gauze or a clean wash cloth for 30 minutes- no peeking!, call 911 right away if you are still bleeding after 30 minutes, or if the bleeding is heavy and unmanageable. ° °Reduce your risk factors of vascular disease: ° °Stop smoking. If you would like help call [facility] at 1-800-QUIT-NOW (1-800-784-8669) or [place] at 336-586-4000. °Manage your cholesterol °Maintain a desired weight °Control your diabetes °Keep your blood pressure down ° °If you have any questions, please call the office at [phone] ° °

## 2020-08-16 DIAGNOSIS — Z7901 Long term (current) use of anticoagulants: Secondary | ICD-10-CM | POA: Diagnosis not present

## 2020-08-16 DIAGNOSIS — Z8616 Personal history of COVID-19: Secondary | ICD-10-CM | POA: Diagnosis not present

## 2020-08-16 DIAGNOSIS — Z87891 Personal history of nicotine dependence: Secondary | ICD-10-CM | POA: Diagnosis not present

## 2020-08-16 DIAGNOSIS — M6281 Muscle weakness (generalized): Secondary | ICD-10-CM | POA: Diagnosis not present

## 2020-08-16 DIAGNOSIS — I4891 Unspecified atrial fibrillation: Secondary | ICD-10-CM | POA: Diagnosis not present

## 2020-08-16 DIAGNOSIS — E119 Type 2 diabetes mellitus without complications: Secondary | ICD-10-CM | POA: Diagnosis not present

## 2020-08-16 DIAGNOSIS — Z91040 Latex allergy status: Secondary | ICD-10-CM | POA: Diagnosis not present

## 2020-08-16 DIAGNOSIS — I739 Peripheral vascular disease, unspecified: Secondary | ICD-10-CM | POA: Diagnosis not present

## 2020-08-16 DIAGNOSIS — I5021 Acute systolic (congestive) heart failure: Secondary | ICD-10-CM | POA: Diagnosis not present

## 2020-08-16 DIAGNOSIS — I110 Hypertensive heart disease with heart failure: Secondary | ICD-10-CM | POA: Diagnosis not present

## 2020-08-16 NOTE — Progress Notes (Addendum)
  Progress Note    08/16/2020 9:12 AM * No surgery found *  Subjective:  no new complaints   Vitals:   08/15/20 2334 08/16/20 0506  BP:  (!) 142/65  Pulse:  63  Resp: 18 19  Temp:  97.9 F (36.6 C)  SpO2: 95% 95%   Physical Exam: Lungs:  non labored Incisions:  R groin cath site without hematoma Extremities:  dressing left in place L foot Neurologic: A&O  CBC    Component Value Date/Time   WBC 10.1 08/15/2020 0308   RBC 4.35 08/15/2020 0308   HGB 13.3 08/15/2020 0308   HGB 12.7 (L) 01/07/2020 1007   HGB 13.0 04/30/2019 0813   HGB 14.5 10/24/2016 1440   HCT 41.5 08/15/2020 0308   HCT 40.8 04/30/2019 0813   HCT 43.8 10/24/2016 1440   PLT 345 08/15/2020 0308   PLT 147 (L) 01/07/2020 1007   PLT 187 04/30/2019 0813   MCV 95.4 08/15/2020 0308   MCV 91 04/30/2019 0813   MCV 92.4 10/24/2016 1440   MCH 30.6 08/15/2020 0308   MCHC 32.0 08/15/2020 0308   RDW 14.8 08/15/2020 0308   RDW 15.5 (H) 04/30/2019 0813   RDW 15.2 (H) 10/24/2016 1440   LYMPHSABS 1.2 08/12/2020 0459   LYMPHSABS 1.0 04/30/2019 0813   LYMPHSABS 1.7 10/24/2016 1440   MONOABS 0.8 08/12/2020 0459   MONOABS 0.9 10/24/2016 1440   EOSABS 0.2 08/12/2020 0459   EOSABS 0.1 04/30/2019 0813   BASOSABS 0.1 08/12/2020 0459   BASOSABS 0.1 04/30/2019 0813   BASOSABS 0.1 10/24/2016 1440    BMET    Component Value Date/Time   NA 137 08/15/2020 0308   NA 141 11/11/2019 1506   NA 137 10/24/2016 1440   K 3.3 (L) 08/15/2020 0308   K 3.6 10/24/2016 1440   CL 101 08/15/2020 0308   CL 105 05/14/2012 0840   CO2 27 08/15/2020 0308   CO2 26 10/24/2016 1440   GLUCOSE 118 (H) 08/15/2020 0308   GLUCOSE 94 10/24/2016 1440   GLUCOSE 102 (H) 05/14/2012 0840   BUN 10 08/15/2020 0308   BUN 16 11/11/2019 1506   BUN 16.2 10/24/2016 1440   CREATININE 0.69 08/15/2020 0308   CREATININE 0.84 01/07/2020 1007   CREATININE 0.8 10/24/2016 1440   CALCIUM 9.3 08/15/2020 0308   CALCIUM 9.2 10/24/2016 1440   GFRNONAA >60  08/15/2020 0308   GFRNONAA >60 01/07/2020 1007   GFRAA 106 11/11/2019 1506   GFRAA >60 01/07/2019 1458    INR    Component Value Date/Time   INR 1.2 02/03/2020 1316     Intake/Output Summary (Last 24 hours) at 08/16/2020 0912 Last data filed at 08/16/2020 0500 Gross per 24 hour  Intake 1200 ml  Output 1075 ml  Net 125 ml     Assessment/Plan:  65 y.o. male is s/p diagnostic aortogram * No surgery found *   R groin cath site without hematoma Defer wound care of bilateral lower extremities to podiatry team TOC applied for re-cert for CIR Ready for discharge when approval received    Dagoberto Ligas, PA-C [facility] [phone] 08/16/2020 9:12 AM   VASCULAR STAFF ADDENDUM: I agree with the above.   Yevonne Aline. Stanford Breed, MD [facility] Phone Number: [phone] 08/16/2020 10:13 AM

## 2020-08-16 NOTE — Progress Notes (Signed)
Patient noted new swelling, erythema and increased temp to knuckle joint on left hand, pt has hx of gout, although has never had it in his upper extremities. Pain noted at the site, no abrasion or skin tear.   Making note for MD to assess tomorrow AM with rounding.

## 2020-08-16 NOTE — Progress Notes (Signed)
  Subjective:  Patient ID: YOUSSIF HAMER, male    DOB: [birth year],  MRN: [id number]  Patient seen bedside. Denies complaints. Awaiting placement back to rehab. Has not worked much with PT since readmit. Objective:   Vitals:   08/16/20 0925 08/16/20 1145  BP: (!) 148/78 (!) 105/57  Pulse: 71 60  Resp: 18 15  Temp:  98.3 F (36.8 C)  SpO2:     General AA&O x3. Normal mood and affect.  Vascular Left foot warm.   Neurologic Epicritic sensation grossly intact.  Dermatologic  Amputation site healing well overall, with some dusky ischemic area centrally. Foot warm, no erythema, no fluctuance or crepitus. Skin flaps warm and viable  Right 2nd/3rd toe PIPJ callus without open ulceration noted  Orthopedic: +Motor distally bilat    Assessment & Plan:  Patient was evaluated and treated and all questions answered.  S/p TMA left -Wound healing well overall. Some areas that will likely experience delayed healing. -Dressed with xeroform and dry dressing. Change qOD. -NWB LLE. Work with PT. Pending d/c to [facility]. -Podiatry will f/u weekly while patient at [facility].  Evelina Bucy, DPM  Accessible via secure chat for questions or concerns.

## 2020-08-16 NOTE — PMR Pre-admission (Signed)
PMR Admission Coordinator Pre-Admission Assessment  Patient: [name] is an 65 y.o., male MRN: [id number] DOB: 12/17/1955 Height:   Weight:    Insurance Information HMO: yes     PPO:      PCP:      IPA:      80/20:      OTHER:  PRIMARY: Blue Medicare       Policy#: [id number]       Subscriber: yes CM Name: Larene Beach      Phone#: [phone]     Fax#:  Pre-Cert#:  [id number]       Employer:  Benefits:  Phone #: [phone]     Name:  Irene Shipper Date: 08/10/2020 - still active Deductible: $0 OOP Max: $3,900 ($0 met) CIR: $335/day co-pay for days 1-6 SNF: $0.00 Copayment per day for days 1-20; $188 Copayment per day for days 21-60; $0.00 copayment for days 61-100 Maximum of 100 days/benefit period Outpatient:  $40 copay/visit Home Health:  100% coverage DME: 80% coverage, 20% co-insurance Financial Counselor:       Phone#:  Providers: in network.  SECONDARY:  The "Data Collection Information Summary" for patients in Inpatient Rehabilitation Facilities with attached "Privacy Act [place] Records" was provided and verbally reviewed with: Patient  Emergency Contact Information Contact Information     Name Relation Home Work Mobile   SAUNDERS,CINDY Friend [phone]  [phone]   Margaretmary Eddy Sister  [phone]       Current Medical History  Patient Admitting Diagnosis: Transmetatarsal Amputation History of Present Illness: [name]. [name] is a 65 year old male with history of follicular lymphoma, morbid obesity-BMI 43, A fib, recent ED visits for CHF exacerbation, left great toe amputation who was admitted on 08/04/20 with acute osteomyelitis of left 2nd and 3rd toe and gangrenous changes of left 2nd toe. He was started on IV vanc and underwent left transmetatarsal ampuation by Dr. Jacqualyn Posey on 07/26. Post op to be NWB with CAM boot and IV antibiotids transitioned to Augmentin/doxycycline for 2 weeks. PT/OT consulted and patient  noted to have  difficulty maintaining NWB with standing, fatigue and limited mobility. CIR recommended due to functional decline. Pt. Was initially admitted to [facility] 08/11/20, then transferred off to acute hospital for abdominal aortogram with bilateral lower extremity runoff 08/14/20. Pt. was re-evaluated by PT and OT and they recommend return to CIR.     Patient's medical record from [facility] has been reviewed by the rehabilitation admission coordinator and physician.  Past Medical History  Past Medical History:  Diagnosis Date   Acute systolic HF (heart failure) (HCC)    Arthritis    lt foot, hips knees    Atrial fibrillation (HCC)    Diabetes mellitus, new onset ([place])    Diverticulosis    Dyslipidemia    Gout attack 01/16/2012   Hypertension    Internal hemorrhoids    Lymphoma ([place])    remission for about 2 years, chemo 3 years prior   Pneumonia due to COVID-19 virus 07/2018   Pulmonary hypertension ([place])    Sepsis ([place]) 02/2017   secondary to influenza; requiring trach   Tubular adenoma of colon    Venous stasis ulcers ([place])     Family History   family history includes Arthritis in his mother; CVA (age of onset: 35) in his sister; Dementia in his mother; Dementia (age of onset: 92) in his brother; Hypertension in his mother.  Prior Rehab/Hospitalizations Has the patient had prior rehab or  hospitalizations prior to admission? Yes  Has the patient had major surgery during 100 days prior to admission? Yes   Current Medications  Current Facility-Administered Medications:    0.9 %  sodium chloride infusion, 250 mL, Intravenous, PRN, Fields, Jessy Oto, MD   acetaminophen (TYLENOL) tablet 650 mg, 650 mg, Oral, Q4H PRN, Elam Dutch, MD, 650 mg at 08/15/20 2005   albuterol (PROVENTIL) (2.5 MG/3ML) 0.083% nebulizer solution 3 mL, 3 mL, Inhalation, Q6H PRN, Cherre Robins, MD   amoxicillin-clavulanate (AUGMENTIN) 875-125 MG per tablet 1 tablet, 1 tablet, Oral, BID, Cherre Robins, MD, 1 tablet at 08/16/20 0258   apixaban (ELIQUIS) tablet 5 mg, 5 mg, Oral, BID, Cherre Robins, MD, 5 mg at 08/16/20 5277   atorvastatin (LIPITOR) tablet 20 mg, 20 mg, Oral, Daily, Cherre Robins, MD, 20 mg at 08/16/20 8242   carvedilol (COREG) tablet 12.5 mg, 12.5 mg, Oral, BID, Cherre Robins, MD, 12.5 mg at 08/16/20 3536   doxycycline (VIBRA-TABS) tablet 100 mg, 100 mg, Oral, BID, Cherre Robins, MD, 100 mg at 08/16/20 1443   hydrALAZINE (APRESOLINE) injection 5 mg, 5 mg, Intravenous, Q20 Min PRN, Fields, Jessy Oto, MD   HYDROcodone-acetaminophen ([place]) 10-325 MG per tablet 1-2 tablet, 1-2 tablet, Oral, Q6H PRN, Cherre Robins, MD, 1 tablet at 08/16/20 1540   labetalol (NORMODYNE) injection 10 mg, 10 mg, Intravenous, Q10 min PRN, Fields, Jessy Oto, MD   losartan (COZAAR) tablet 50 mg, 50 mg, Oral, Daily, Cherre Robins, MD, 50 mg at 08/16/20 0867   morphine 2 MG/ML injection 2 mg, 2 mg, Intravenous, Q1H PRN, Elam Dutch, MD   ondansetron [facility]) injection 4 mg, 4 mg, Intravenous, Q6H PRN, Elam Dutch, MD   oxyCODONE (Oxy IR/ROXICODONE) immediate release tablet 5-10 mg, 5-10 mg, Oral, Q4H PRN, Elam Dutch, MD, 5 mg at 08/15/20 2005   pantoprazole (PROTONIX) EC tablet 40 mg, 40 mg, Oral, Daily, Cherre Robins, MD, 40 mg at 08/16/20 6195   potassium chloride SA (KLOR-CON) CR tablet 20 mEq, 20 mEq, Oral, Daily, Cherre Robins, MD, 20 mEq at 08/16/20 0932   sertraline (ZOLOFT) tablet 50 mg, 50 mg, Oral, Daily, Cherre Robins, MD, 50 mg at 08/16/20 6712   sodium chloride flush (NS) 0.9 % injection 3 mL, 3 mL, Intravenous, Q12H, Fields, Charles E, MD, 3 mL at 08/16/20 0926   sodium chloride flush (NS) 0.9 % injection 3 mL, 3 mL, Intravenous, PRN, Elam Dutch, MD   torsemide [facility]) tablet 20 mg, 20 mg, Oral, Daily, Cherre Robins, MD, 20 mg at 08/16/20 4580  Facility-Administered Medications Ordered in Other Encounters:    sodium chloride 0.9  % injection 10 mL, 10 mL, Intravenous, PRN, Curt Bears, MD, 10 mL at 05/04/15 1529  Patients Current Diet:  Diet Order             Diet Carb Modified Fluid consistency: Thin; Room service appropriate? Yes  Diet effective now                   Precautions / Restrictions Precautions Precautions: Fall Precaution Comments: venous stasis, NWB LLE w/ DARCO Other Brace: L darco shoe Restrictions Weight Bearing Restrictions: Yes LLE Weight Bearing: Non weight bearing Other Position/Activity Restrictions: Pt has difficulty maintaining weight bearing in standing and fatigues quickly.   Has the patient had 2 or more falls or a fall with injury in the past year? No  Prior Activity Level  Pt. Was active in the community, working prior to admission  Prior Functional Level Self Care: Did the patient need help bathing, dressing, using the toilet or eating? Independent  Indoor Mobility: Did the patient need assistance with walking from room to room (with or without device)? Independent  Stairs: Did the patient need assistance with internal or external stairs (with or without device)? Independent  Functional Cognition: Did the patient need help planning regular tasks such as shopping or remembering to take medications? Independent  Home Assistive Devices / Equipment Home Equipment: Walker - 2 wheels, Cane - single point  Prior Device Use: Indicate devices/aids used by the patient prior to current illness, exacerbation or injury? None of the above  Current Functional Level Cognition  Overall Cognitive Status: Within Functional Limits for tasks assessed Orientation Level: Oriented X4 General Comments: Pt is motivated to get better and home    Extremity Assessment (includes Sensation/Coordination)  Upper Extremity Assessment: Overall WFL for tasks assessed  Lower Extremity Assessment: Defer to PT evaluation LLE Deficits / Details: NWB L LE, did not assess MMT on this side LLE  Coordination: decreased gross motor    ADLs  Overall ADL's : Needs assistance/impaired Eating/Feeding: Set up, Sitting Grooming: Set up, Sitting Upper Body Bathing: Set up, Sitting Lower Body Bathing: Maximal assistance, Sit to/from stand Upper Body Dressing : Set up, Sitting Lower Body Dressing: Maximal assistance, Sit to/from stand Toilet Transfer: Moderate assistance, Stand-pivot Toileting- Clothing Manipulation and Hygiene: Maximal assistance, Sit to/from stand Scientist, research (medical): Moderate assistance, Stand-pivot Functional mobility during ADLs: Moderate assistance, Rolling walker General ADL Comments: Pt requiring mod A for all transfers and set up for UB ADL's and Max A for LB ADL's when having to complete sit<>stands and maintaining balance    Mobility  Overal bed mobility: Needs Assistance Bed Mobility: Rolling, Supine to Sit, Sit to Supine Rolling: Supervision Supine to sit: Supervision Sit to supine: Supervision General bed mobility comments: Sup for safety, increased time and use of bed rails    Transfers  Overall transfer level: Needs assistance Equipment used: Rolling walker (2 wheeled) Transfers: Sit to/from Stand, Lateral/Scoot Transfers Sit to Stand: Mod assist  Lateral/Scoot Transfers: Min guard General transfer comment: on first attempt needed [place] to boost all the way to upright standing, but able to perform with MinA on second attempt. Inconsistent with maintaining WB status L LE. Able to perform lateral scoots up EOB with min guard while maintaining NWB L LE    Ambulation / Gait / Stairs / Wheelchair Mobility  Ambulation/Gait General Gait Details: deferred    Posture / Balance Balance Overall balance assessment: Needs assistance Sitting-balance support: Bilateral upper extremity supported, Feet supported Sitting balance-Leahy Scale: Good Standing balance support: Bilateral upper extremity supported Standing balance-Leahy Scale: Poor    Special  needs/care consideration Skin Sugical incision L foot, Cracking of bilateral heels, ecchymosis of the arm and Special service needs needs bariatric equipment, NWB on LLE   Previous Home Environment (from acute therapy documentation) Living Arrangements: Alone  Lives With: Alone Available Help at Discharge: Friend(s), Available PRN/intermittently, Family Type of Home: House Home Layout: Bed/bath upstairs, Able to live on main level with bedroom/bathroom Home Access: Stairs to enter Entrance Stairs-Rails: None Entrance Stairs-Number of Steps: 1 Bathroom Shower/Tub: Public librarian, Multimedia programmer: Standard Bathroom Accessibility: Yes How Accessible: Accessible via walker  Discharge Living Setting Plans for Discharge Living Setting: Patient's home Type of Home at Discharge: House Discharge Home Layout: One level Discharge Home Access:  Stairs to enter Discharge Bathroom Shower/Tub: Tub/shower unit Discharge Bathroom Toilet: Standard Discharge Bathroom Accessibility: Yes How Accessible: Accessible via walker   Social/Family/Support Systems Patient Roles: Other (Comment) Contact Information: [phone] Anticipated Caregiver: Mariea Stable Anticipated Caregiver's Contact Information: [phone] Ability/Limitations of Caregiver: can provide min A Caregiver Availability: 24/7 Discharge Plan Discussed with Primary Caregiver: Yes Is Caregiver In Agreement with Plan?: Yes   Goals Patient/Family Goal for Rehab: PT/OT Supervision Expected length of stay: 12-14 days Pt/Family Agrees to Admission and willing to participate: Yes Program Orientation Provided & Reviewed with Pt/Caregiver Including Roles  & Responsibilities: Yes   Decrease burden of Care through IP rehab admission: Specialzed equipment needs, Decrease number of caregivers, Bowel and bladder program, and Patient/family education    Patient Condition: I have reviewed medical records from [facility], spoken with CM, and patient. I met with patient at the bedside for inpatient rehabilitation assessment.  Patient will benefit from ongoing PT and OT, can actively participate in 3 hours of therapy a day 5 days of the week, and can make measurable gains during the admission.  Patient will also benefit from the coordinated team approach during an Inpatient Acute Rehabilitation admission.  The patient will receive intensive therapy as well as Rehabilitation physician, nursing, social worker, and care management interventions.  Due to safety, skin/wound care, disease management, medication administration, pain management, and patient education the patient requires 24 hour a day rehabilitation nursing.  The patient is currently min-max A with mobility and basic ADLs.  Discharge setting and therapy post discharge at home with home health is anticipated.  Patient has agreed to participate in the Acute Inpatient Rehabilitation Program and will admit today.  Preadmission Screen Completed By:  Genella Mech, 08/16/2020 9:32 AM ______________________________________________________________________   Discussed status with Dr. Dagoberto Ligas on 08/18/20 at 54 and received approval for admission today.  Admission Coordinator:  Genella Mech, CCC-SLP, time 930/Date 08/18/20   Assessment/Plan: Diagnosis: s/p transmetatarsal fracture Does the need for close, 24 hr/day Medical supervision in concert with the patient's rehab needs make it unreasonable for this patient to be served in a less intensive setting? Yes Co-Morbidities requiring supervision/potential complications: morbid obesity (BMI 58.30), grade 2 follicular lymphoma, gout attack, pressure injury of skin, peripheral arterial disease Due to bladder management, bowel management, safety, skin/wound care, disease management, medication administration, pain management, and patient education, does the patient require 24 hr/day rehab nursing? Yes Does the  patient require coordinated care of a physician, rehab nurse, PT, OT to address physical and functional deficits in the context of the above medical diagnosis(es)? Yes Addressing deficits in the following areas: balance, endurance, locomotion, strength, transferring, bowel/bladder control, bathing, dressing, feeding, grooming, toileting, and psychosocial support Can the patient actively participate in an intensive therapy program of at least 3 hrs of therapy 5 days a week? Yes The potential for patient to make measurable gains while on inpatient rehab is excellent Anticipated functional outcomes upon discharge from inpatient rehab: modified independent PT, modified independent OT, independent SLP Estimated rehab length of stay to reach the above functional goals is: 2-3 weeks Anticipated discharge destination: Home 10. Overall Rehab/Functional Prognosis: excellent   MD Signature: Leeroy Cha, MD

## 2020-08-16 NOTE — Plan of Care (Signed)
  Problem: Cardiovascular: Goal: Ability to achieve and maintain adequate cardiovascular perfusion will improve Outcome: Progressing Goal: Vascular access site(s) Level 0-1 will be maintained Outcome: Progressing   

## 2020-08-17 ENCOUNTER — Ambulatory Visit (HOSPITAL_COMMUNITY): Payer: BC Managed Care – PPO

## 2020-08-17 ENCOUNTER — Encounter (HOSPITAL_COMMUNITY): Payer: Self-pay | Admitting: Vascular Surgery

## 2020-08-17 ENCOUNTER — Encounter: Payer: Self-pay | Admitting: Internal Medicine

## 2020-08-17 DIAGNOSIS — Z87891 Personal history of nicotine dependence: Secondary | ICD-10-CM | POA: Diagnosis not present

## 2020-08-17 DIAGNOSIS — M6281 Muscle weakness (generalized): Secondary | ICD-10-CM | POA: Diagnosis not present

## 2020-08-17 DIAGNOSIS — Z91040 Latex allergy status: Secondary | ICD-10-CM | POA: Diagnosis not present

## 2020-08-17 DIAGNOSIS — I4891 Unspecified atrial fibrillation: Secondary | ICD-10-CM | POA: Diagnosis not present

## 2020-08-17 DIAGNOSIS — I5021 Acute systolic (congestive) heart failure: Secondary | ICD-10-CM | POA: Diagnosis not present

## 2020-08-17 DIAGNOSIS — Z8616 Personal history of COVID-19: Secondary | ICD-10-CM | POA: Diagnosis not present

## 2020-08-17 DIAGNOSIS — I110 Hypertensive heart disease with heart failure: Secondary | ICD-10-CM | POA: Diagnosis not present

## 2020-08-17 DIAGNOSIS — Z7901 Long term (current) use of anticoagulants: Secondary | ICD-10-CM | POA: Diagnosis not present

## 2020-08-17 DIAGNOSIS — I739 Peripheral vascular disease, unspecified: Secondary | ICD-10-CM | POA: Diagnosis not present

## 2020-08-17 DIAGNOSIS — E119 Type 2 diabetes mellitus without complications: Secondary | ICD-10-CM | POA: Diagnosis not present

## 2020-08-17 NOTE — Progress Notes (Signed)
2200 dose of Coreg held due to SB on telemetry HR 52-60 and dropped to 37 non sustained. Will continue to monitor. Jessie Foot, RN

## 2020-08-17 NOTE — Progress Notes (Addendum)
  Progress Note    08/17/2020 8:46 AM   Subjective:  no complaints  Afebrile   Vitals:   08/16/20 2027 08/17/20 0423  BP: 131/73 (!) 145/87  Pulse: 60 60  Resp: 18 17  Temp: 98.3 F (36.8 C) 98.3 F (36.8 C)  SpO2: 95% 95%    Physical Exam: General:  no distress Lungs:  non labored Incisions:  right groin is soft without hematoma Extremities:  left foot bandaged-clean and dry   CBC    Component Value Date/Time   WBC 10.1 08/15/2020 0308   RBC 4.35 08/15/2020 0308   HGB 13.3 08/15/2020 0308   HGB 12.7 (L) 01/07/2020 1007   HGB 13.0 04/30/2019 0813   HGB 14.5 10/24/2016 1440   HCT 41.5 08/15/2020 0308   HCT 40.8 04/30/2019 0813   HCT 43.8 10/24/2016 1440   PLT 345 08/15/2020 0308   PLT 147 (L) 01/07/2020 1007   PLT 187 04/30/2019 0813   MCV 95.4 08/15/2020 0308   MCV 91 04/30/2019 0813   MCV 92.4 10/24/2016 1440   MCH 30.6 08/15/2020 0308   MCHC 32.0 08/15/2020 0308   RDW 14.8 08/15/2020 0308   RDW 15.5 (H) 04/30/2019 0813   RDW 15.2 (H) 10/24/2016 1440   LYMPHSABS 1.2 08/12/2020 0459   LYMPHSABS 1.0 04/30/2019 0813   LYMPHSABS 1.7 10/24/2016 1440   MONOABS 0.8 08/12/2020 0459   MONOABS 0.9 10/24/2016 1440   EOSABS 0.2 08/12/2020 0459   EOSABS 0.1 04/30/2019 0813   BASOSABS 0.1 08/12/2020 0459   BASOSABS 0.1 04/30/2019 0813   BASOSABS 0.1 10/24/2016 1440    BMET    Component Value Date/Time   NA 137 08/15/2020 0308   NA 141 11/11/2019 1506   NA 137 10/24/2016 1440   K 3.3 (L) 08/15/2020 0308   K 3.6 10/24/2016 1440   CL 101 08/15/2020 0308   CL 105 05/14/2012 0840   CO2 27 08/15/2020 0308   CO2 26 10/24/2016 1440   GLUCOSE 118 (H) 08/15/2020 0308   GLUCOSE 94 10/24/2016 1440   GLUCOSE 102 (H) 05/14/2012 0840   BUN 10 08/15/2020 0308   BUN 16 11/11/2019 1506   BUN 16.2 10/24/2016 1440   CREATININE 0.69 08/15/2020 0308   CREATININE 0.84 01/07/2020 1007   CREATININE 0.8 10/24/2016 1440   CALCIUM 9.3 08/15/2020 0308   CALCIUM 9.2  10/24/2016 1440   GFRNONAA >60 08/15/2020 0308   GFRNONAA >60 01/07/2020 1007   GFRAA 106 11/11/2019 1506   GFRAA >60 01/07/2019 1458    INR    Component Value Date/Time   INR 1.2 02/03/2020 1316     Intake/Output Summary (Last 24 hours) at 08/17/2020 0846 Last data filed at 08/16/2020 1659 Gross per 24 hour  Intake 480 ml  Output 1150 ml  Net -670 ml     Assessment/Plan:  65 y.o. male is s/p:  Diagnostic aortogram     -right groin site is clean without hematoma -foot care per podiatry -continue to await approval for CIR    Leontine Locket, PA-C [facility] [phone] 08/17/2020 8:46 AM   Agree with above. Hopefully rehab today.  Ruta Hinds, MD [facility] [place] Office: [phone]

## 2020-08-17 NOTE — Progress Notes (Signed)
Physical Therapy Treatment Patient Details Name: [name] MRN: [id number] DOB: 29-Sep-1955 Today's Date: 08/17/2020    History of Present Illness 65yo male admitted back to acute care 08/13/20 from [facility] for possible further interventions at the acute L TMA site as well as possible amputation of R 2nd and 3rd toes. PMH heart failure, A-fib, DM, HLD, gout, HTN, L TMA 08/05/20    PT Comments    Focused on sit to stand transition and standing balance/tolerance while maintaining weightbearing status. Pt remains very motivated to return to CIR for further rehab prior to return home.    Follow Up Recommendations  CIR     Equipment Recommendations  Rolling walker with 5" wheels;Wheelchair (measurements PT);Wheelchair cushion (measurements PT);3in1 (PT) (bariatric)    Recommendations for Other Services       Precautions / Restrictions Precautions Precautions: Fall Required Braces or Orthoses: Other Brace Other Brace: L darco shoe Restrictions Weight Bearing Restrictions: Yes LLE Weight Bearing: Non weight bearing    Mobility  Bed Mobility Overal bed mobility: Needs Assistance Bed Mobility: Rolling;Supine to Sit;Sit to Supine Rolling: Supervision   Supine to sit: Supervision Sit to supine: Supervision   General bed mobility comments: Incr time, HOB elevated and use of bed rails    Transfers Overall transfer level: Needs assistance Equipment used: Rolling walker (2 wheeled) Transfers: Sit to/from Stand;Lateral/Scoot Transfers Sit to Stand: Min assist;Mod assist        Lateral/Scoot Transfers: Min guard;From elevated surface General transfer comment: Assist to bring hips up and for balance with sit to stand. Mod assist on first attempt and min assist on subsequent attempts all with bed elevated. At times pt placing some weight on LLE. Pt able to laterally scoot up side of bed with min guard. Performed sit to stand x 7.  Ambulation/Gait                 Stairs              Wheelchair Mobility    Modified Rankin (Stroke Patients Only)       Balance Overall balance assessment: Needs assistance Sitting-balance support: No upper extremity supported;Feet supported Sitting balance-Leahy Scale: Good     Standing balance support: Bilateral upper extremity supported Standing balance-Leahy Scale: Poor Standing balance comment: Walker and min to min guard assist for static standing. Pt stood x 7 for 20-90 seconds                            Cognition Arousal/Alertness: Awake/alert Behavior During Therapy: WFL for tasks assessed/performed Overall Cognitive Status: Within Functional Limits for tasks assessed                                        Exercises      General Comments        Pertinent Vitals/Pain Pain Assessment: No/denies pain    Home Living                      Prior Function            PT Goals (current goals can now be found in the care plan section) Acute Rehab PT Goals Patient Stated Goal: go back to rehab Progress towards PT goals: Progressing toward goals    Frequency    Min 3X/week  PT Plan Current plan remains appropriate    Co-evaluation              AM-PAC PT "6 Clicks" Mobility   Outcome Measure  Help needed turning from your back to your side while in a flat bed without using bedrails?: None Help needed moving from lying on your back to sitting on the side of a flat bed without using bedrails?: A Little Help needed moving to and from a bed to a chair (including a wheelchair)?: A Little Help needed standing up from a chair using your arms (e.g., wheelchair or bedside chair)?: A Lot Help needed to walk in hospital room?: Total Help needed climbing 3-5 steps with a railing? : Total 6 Click Score: 14    End of Session Equipment Utilized During Treatment: Gait belt Activity Tolerance: Patient tolerated treatment well Patient left: in bed;with call  bell/phone within reach;with bed alarm set   PT Visit Diagnosis: Pain;Difficulty in walking, not elsewhere classified (R26.2);Muscle weakness (generalized) (M62.81);Other abnormalities of gait and mobility (R26.89)     Time: 1732-1800 PT Time Calculation (min) (ACUTE ONLY): 28 min  Charges:  $Therapeutic Activity: 23-37 mins                     Jack Pager [phone] Office [place] 08/17/2020, 6:24 PM

## 2020-08-18 ENCOUNTER — Other Ambulatory Visit: Payer: Self-pay

## 2020-08-18 ENCOUNTER — Inpatient Hospital Stay (HOSPITAL_COMMUNITY)
Admission: RE | Admit: 2020-08-18 | Discharge: 2020-08-27 | DRG: 560 | Disposition: A | Discharge status: Home Health | Payer: Medicare Other | Source: Intra-hospital | Attending: Physical Medicine and Rehabilitation | Admitting: Physical Medicine and Rehabilitation

## 2020-08-18 ENCOUNTER — Encounter (HOSPITAL_COMMUNITY): Payer: Self-pay | Admitting: Physical Medicine and Rehabilitation

## 2020-08-18 DIAGNOSIS — M109 Gout, unspecified: Secondary | ICD-10-CM | POA: Diagnosis present

## 2020-08-18 DIAGNOSIS — G8918 Other acute postprocedural pain: Secondary | ICD-10-CM | POA: Diagnosis present

## 2020-08-18 DIAGNOSIS — R0989 Other specified symptoms and signs involving the circulatory and respiratory systems: Secondary | ICD-10-CM

## 2020-08-18 DIAGNOSIS — Z6841 Body Mass Index (BMI) 40.0 and over, adult: Secondary | ICD-10-CM

## 2020-08-18 DIAGNOSIS — Z91040 Latex allergy status: Secondary | ICD-10-CM | POA: Diagnosis not present

## 2020-08-18 DIAGNOSIS — R001 Bradycardia, unspecified: Secondary | ICD-10-CM | POA: Diagnosis not present

## 2020-08-18 DIAGNOSIS — B372 Candidiasis of skin and nail: Secondary | ICD-10-CM | POA: Diagnosis not present

## 2020-08-18 DIAGNOSIS — R7303 Prediabetes: Secondary | ICD-10-CM | POA: Diagnosis not present

## 2020-08-18 DIAGNOSIS — I739 Peripheral vascular disease, unspecified: Secondary | ICD-10-CM | POA: Diagnosis present

## 2020-08-18 DIAGNOSIS — R197 Diarrhea, unspecified: Secondary | ICD-10-CM | POA: Diagnosis not present

## 2020-08-18 DIAGNOSIS — Z823 Family history of stroke: Secondary | ICD-10-CM | POA: Diagnosis not present

## 2020-08-18 DIAGNOSIS — Z7901 Long term (current) use of anticoagulants: Secondary | ICD-10-CM | POA: Diagnosis not present

## 2020-08-18 DIAGNOSIS — Z89432 Acquired absence of left foot: Secondary | ICD-10-CM

## 2020-08-18 DIAGNOSIS — Z8616 Personal history of COVID-19: Secondary | ICD-10-CM | POA: Diagnosis not present

## 2020-08-18 DIAGNOSIS — I878 Other specified disorders of veins: Secondary | ICD-10-CM | POA: Diagnosis present

## 2020-08-18 DIAGNOSIS — E871 Hypo-osmolality and hyponatremia: Secondary | ICD-10-CM

## 2020-08-18 DIAGNOSIS — I5022 Chronic systolic (congestive) heart failure: Secondary | ICD-10-CM | POA: Diagnosis not present

## 2020-08-18 DIAGNOSIS — I429 Cardiomyopathy, unspecified: Secondary | ICD-10-CM | POA: Diagnosis not present

## 2020-08-18 DIAGNOSIS — M6281 Muscle weakness (generalized): Secondary | ICD-10-CM | POA: Diagnosis not present

## 2020-08-18 DIAGNOSIS — I4891 Unspecified atrial fibrillation: Secondary | ICD-10-CM | POA: Diagnosis not present

## 2020-08-18 DIAGNOSIS — E119 Type 2 diabetes mellitus without complications: Secondary | ICD-10-CM | POA: Diagnosis not present

## 2020-08-18 DIAGNOSIS — I10 Essential (primary) hypertension: Secondary | ICD-10-CM

## 2020-08-18 DIAGNOSIS — Z87891 Personal history of nicotine dependence: Secondary | ICD-10-CM

## 2020-08-18 DIAGNOSIS — I5021 Acute systolic (congestive) heart failure: Secondary | ICD-10-CM | POA: Diagnosis not present

## 2020-08-18 DIAGNOSIS — E8809 Other disorders of plasma-protein metabolism, not elsewhere classified: Secondary | ICD-10-CM | POA: Diagnosis present

## 2020-08-18 DIAGNOSIS — R5381 Other malaise: Secondary | ICD-10-CM | POA: Diagnosis not present

## 2020-08-18 DIAGNOSIS — R350 Frequency of micturition: Secondary | ICD-10-CM | POA: Diagnosis not present

## 2020-08-18 DIAGNOSIS — L89322 Pressure ulcer of left buttock, stage 2: Secondary | ICD-10-CM | POA: Diagnosis not present

## 2020-08-18 DIAGNOSIS — I110 Hypertensive heart disease with heart failure: Secondary | ICD-10-CM | POA: Diagnosis not present

## 2020-08-18 DIAGNOSIS — Z8249 Family history of ischemic heart disease and other diseases of the circulatory system: Secondary | ICD-10-CM | POA: Diagnosis not present

## 2020-08-18 DIAGNOSIS — C8210 Follicular lymphoma grade II, unspecified site: Secondary | ICD-10-CM

## 2020-08-18 DIAGNOSIS — Z4781 Encounter for orthopedic aftercare following surgical amputation: Principal | ICD-10-CM

## 2020-08-18 DIAGNOSIS — L97519 Non-pressure chronic ulcer of other part of right foot with unspecified severity: Secondary | ICD-10-CM | POA: Diagnosis not present

## 2020-08-18 DIAGNOSIS — Z8261 Family history of arthritis: Secondary | ICD-10-CM

## 2020-08-18 DIAGNOSIS — Z8572 Personal history of non-Hodgkin lymphomas: Secondary | ICD-10-CM

## 2020-08-18 DIAGNOSIS — G4733 Obstructive sleep apnea (adult) (pediatric): Secondary | ICD-10-CM | POA: Diagnosis present

## 2020-08-18 LAB — GLUCOSE, CAPILLARY
Glucose-Capillary: 108 mg/dL — ABNORMAL HIGH (ref 70–99)
Glucose-Capillary: 127 mg/dL — ABNORMAL HIGH (ref 70–99)

## 2020-08-18 LAB — BASIC METABOLIC PANEL
Anion gap: 8 (ref 5–15)
BUN: 15 mg/dL (ref 8–23)
CO2: 28 mmol/L (ref 22–32)
Calcium: 9 mg/dL (ref 8.9–10.3)
Chloride: 98 mmol/L (ref 98–111)
Creatinine, Ser: 1.01 mg/dL (ref 0.61–1.24)
GFR, Estimated: >60 mL/min (ref >60)
Glucose, Bld: 116 mg/dL — ABNORMAL HIGH (ref 70–99)
Potassium: 3.9 mmol/L (ref 3.5–5.1)
Sodium: 134 mmol/L — ABNORMAL LOW (ref 135–145)

## 2020-08-18 MED ORDER — OXYCODONE HCL 5 MG PO TABS
5.0000 mg | ORAL_TABLET | ORAL | Status: DC | PRN
  Administered 2020-08-18: 10 mg via ORAL
  Filled 2020-08-18: qty 2

## 2020-08-18 MED ORDER — TORSEMIDE 20 MG PO TABS
20.0000 mg | ORAL_TABLET | Freq: Every day | ORAL | Status: DC
  Administered 2020-08-19: 20 mg via ORAL
  Filled 2020-08-18 (×3): qty 1

## 2020-08-18 MED ORDER — HYDROCERIN EX CREA
TOPICAL_CREAM | Freq: Two times a day (BID) | CUTANEOUS | Status: DC
  Filled 2020-08-18: qty 113

## 2020-08-18 MED ORDER — HYDROCODONE-ACETAMINOPHEN 10-325 MG PO TABS: 1.0000 | ORAL_TABLET | Freq: Four times a day (QID) | ORAL | Status: DC | PRN

## 2020-08-18 MED ORDER — TRAZODONE HCL 50 MG PO TABS: 25.0000 mg | ORAL_TABLET | Freq: Every evening | ORAL | Status: DC | PRN

## 2020-08-18 MED ORDER — AMOXICILLIN-POT CLAVULANATE 875-125 MG PO TABS
1.0000 | ORAL_TABLET | Freq: Two times a day (BID) | ORAL | Status: AC
  Administered 2020-08-18 – 2020-08-23 (×11): 1 via ORAL
  Filled 2020-08-18 (×11): qty 1

## 2020-08-18 MED ORDER — ALUM & MAG HYDROXIDE-SIMETH 200-200-20 MG/5ML PO SUSP: 30.0000 mL | ORAL | Status: DC | PRN

## 2020-08-18 MED ORDER — POTASSIUM CHLORIDE CRYS ER 20 MEQ PO TBCR
20.0000 meq | EXTENDED_RELEASE_TABLET | Freq: Every day | ORAL | Status: DC
  Administered 2020-08-19 – 2020-08-27 (×9): 20 meq via ORAL
  Filled 2020-08-18 (×9): qty 1

## 2020-08-18 MED ORDER — POLYETHYLENE GLYCOL 3350 17 G PO PACK: 17.0000 g | PACK | Freq: Every day | ORAL | Status: DC | PRN

## 2020-08-18 MED ORDER — ATORVASTATIN CALCIUM 10 MG PO TABS
20.0000 mg | ORAL_TABLET | Freq: Every day | ORAL | Status: DC
  Administered 2020-08-19 – 2020-08-27 (×9): 20 mg via ORAL
  Filled 2020-08-18 (×9): qty 2

## 2020-08-18 MED ORDER — CARVEDILOL 12.5 MG PO TABS
12.5000 mg | ORAL_TABLET | Freq: Two times a day (BID) | ORAL | Status: DC
  Administered 2020-08-19 – 2020-08-21 (×3): 12.5 mg via ORAL
  Filled 2020-08-18 (×6): qty 1

## 2020-08-18 MED ORDER — DOXYCYCLINE HYCLATE 100 MG PO TABS
100.0000 mg | ORAL_TABLET | Freq: Two times a day (BID) | ORAL | Status: AC
  Administered 2020-08-18 – 2020-08-23 (×11): 100 mg via ORAL
  Filled 2020-08-18 (×11): qty 1

## 2020-08-18 MED ORDER — PROCHLORPERAZINE EDISYLATE 10 MG/2ML IJ SOLN: 5.0000 mg | Freq: Four times a day (QID) | INTRAMUSCULAR | Status: DC | PRN

## 2020-08-18 MED ORDER — DIPHENHYDRAMINE HCL 12.5 MG/5ML PO ELIX: 12.5000 mg | ORAL_SOLUTION | Freq: Four times a day (QID) | ORAL | Status: DC | PRN

## 2020-08-18 MED ORDER — NYSTATIN 100000 UNIT/GM EX POWD
Freq: Three times a day (TID) | CUTANEOUS | Status: DC
  Filled 2020-08-18 (×2): qty 15

## 2020-08-18 MED ORDER — SERTRALINE HCL 50 MG PO TABS
50.0000 mg | ORAL_TABLET | Freq: Every day | ORAL | Status: DC
  Administered 2020-08-19 – 2020-08-27 (×9): 50 mg via ORAL
  Filled 2020-08-18 (×9): qty 1

## 2020-08-18 MED ORDER — EXERCISE FOR HEART AND HEALTH BOOK
Freq: Once | Status: AC
  Filled 2020-08-18: qty 1

## 2020-08-18 MED ORDER — PANTOPRAZOLE SODIUM 40 MG PO TBEC
40.0000 mg | DELAYED_RELEASE_TABLET | Freq: Every day | ORAL | Status: DC
  Administered 2020-08-19 – 2020-08-27 (×9): 40 mg via ORAL
  Filled 2020-08-18 (×9): qty 1

## 2020-08-18 MED ORDER — APIXABAN 5 MG PO TABS
5.0000 mg | ORAL_TABLET | Freq: Two times a day (BID) | ORAL | Status: DC
  Administered 2020-08-18 – 2020-08-27 (×18): 5 mg via ORAL
  Filled 2020-08-18 (×18): qty 1

## 2020-08-18 MED ORDER — BISACODYL 10 MG RE SUPP: 10.0000 mg | Freq: Every day | RECTAL | Status: DC | PRN

## 2020-08-18 MED ORDER — GUAIFENESIN-DM 100-10 MG/5ML PO SYRP: 5.0000 mL | ORAL_SOLUTION | Freq: Four times a day (QID) | ORAL | Status: DC | PRN

## 2020-08-18 MED ORDER — COLCHICINE 0.6 MG PO TABS
0.6000 mg | ORAL_TABLET | Freq: Two times a day (BID) | ORAL | Status: DC
  Administered 2020-08-19 – 2020-08-27 (×17): 0.6 mg via ORAL
  Filled 2020-08-18 (×19): qty 1

## 2020-08-18 MED ORDER — INSULIN ASPART 100 UNIT/ML IJ SOLN: 0.0000 [IU] | Freq: Three times a day (TID) | INTRAMUSCULAR | Status: DC

## 2020-08-18 MED ORDER — COLCHICINE 0.6 MG PO TABS
0.6000 mg | ORAL_TABLET | Freq: Two times a day (BID) | ORAL | Status: DC
  Administered 2020-08-18: 0.6 mg via ORAL
  Filled 2020-08-18: qty 1

## 2020-08-18 MED ORDER — LIVING WELL WITH DIABETES BOOK
Freq: Once | Status: AC
  Filled 2020-08-18: qty 1

## 2020-08-18 MED ORDER — COLCHICINE 0.6 MG PO TABS: 0.6000 mg | ORAL_TABLET | Freq: Every day | ORAL | Status: DC

## 2020-08-18 MED ORDER — FLEET ENEMA 7-19 GM/118ML RE ENEM: 1.0000 | ENEMA | Freq: Once | RECTAL | Status: DC | PRN

## 2020-08-18 MED ORDER — MORPHINE SULFATE (PF) 2 MG/ML IV SOLN: 2.0000 mg | INTRAVENOUS | Status: DC | PRN

## 2020-08-18 MED ORDER — PROCHLORPERAZINE 25 MG RE SUPP: 12.5000 mg | Freq: Four times a day (QID) | RECTAL | Status: DC | PRN

## 2020-08-18 MED ORDER — ALBUTEROL SULFATE (2.5 MG/3ML) 0.083% IN NEBU: 3.0000 mL | INHALATION_SOLUTION | Freq: Four times a day (QID) | RESPIRATORY_TRACT | Status: DC | PRN

## 2020-08-18 MED ORDER — LOSARTAN POTASSIUM 50 MG PO TABS
50.0000 mg | ORAL_TABLET | Freq: Every day | ORAL | Status: DC
  Administered 2020-08-19 – 2020-08-27 (×9): 50 mg via ORAL
  Filled 2020-08-18 (×9): qty 1

## 2020-08-18 MED ORDER — PROCHLORPERAZINE MALEATE 5 MG PO TABS: 5.0000 mg | ORAL_TABLET | Freq: Four times a day (QID) | ORAL | Status: DC | PRN

## 2020-08-18 MED ORDER — FLUCONAZOLE 200 MG PO TABS
200.0000 mg | ORAL_TABLET | Freq: Once | ORAL | Status: AC
  Administered 2020-08-18: 200 mg via ORAL
  Filled 2020-08-18: qty 1

## 2020-08-18 MED ORDER — INSULIN ASPART 100 UNIT/ML IJ SOLN: 0.0000 [IU] | Freq: Every day | INTRAMUSCULAR | Status: DC

## 2020-08-18 MED ORDER — ACETAMINOPHEN 325 MG PO TABS: 325.0000 mg | ORAL_TABLET | ORAL | Status: DC | PRN

## 2020-08-18 NOTE — Progress Notes (Signed)
Inpatient Rehab Admissions Coordinator:   I received insurance auth for CIR admit and have a bed for this Pt. On CIR today.  RN may call report to [phone] after 12pm.  Clemens Catholic, [place], [place] Admissions Coordinator  [phone] (office)

## 2020-08-18 NOTE — Progress Notes (Signed)
Subjective: 65 year old male status post transmetatarsal amputation left foot on August 05, 2020.  He was discharged to rehab but there is concern about healing recommend vascular consult given abnormal ABIs.  Underwent angio with Dr. Eden Lathe on August 14, 2020.  He awaiting admission back to rehab.  Main concern tonight is gout in his left hand asking for colchicine  Objective: AAO x3, NAD On the left foot status post transmetatarsal amputation with sutures, staples intact.  There is no dehiscence.  Some of bloody drainage on the bandage but there is no purulence.  No erythema or warmth of the foot.  No fluctuation of the rotation. On the right foot dorsal second and third toes of hyperkeratotic lesions which are preulcerative.  No drainage or pus or any edema, erythema. No pain with calf compression, swelling, warmth, erythema  Assessment: Status post left transmetatarsal amputation; right toe ulcerations without signs of infection  Plan: Dressing was changed today.  Xeroform was applied followed by dry sterile dressing.  Recommend daily dressing changes.  Can consider Nitropatch to help see if this can increase circulation to the foot to potentially help facilitate healing.  Nonweightbearing left foot.  Asking for colchicine for his left hand.  Defer to primary.  Celesta Gentile, DPM

## 2020-08-18 NOTE — Progress Notes (Addendum)
Progress Note    08/18/2020 7:53 AM * No surgery found *  Subjective:  complaining of pain in knuckles of pointer fingers bilateral hands. Says he has gout and usually takes allopurinol daily and colchicine for flares. No other complaints. Hopeful to go to CIR   Vitals:   08/17/20 1916 08/17/20 2335  BP: 118/61   Pulse: (!) 48 (!) 46  Resp: 17 16  Temp: 98.3 F (36.8 C)   SpO2: 94% 96%   Physical Exam: Cardiac:  regular Lungs:  non labored Extremities:  right groin without swelling or hematoma. Left foot dressed. Clean, dry and intact. Right foot toe ulcers unchanged. Left and right pointer fingers with swelling, erythema and tenderness over MCP joints Neurologic: alert and oriented  CBC    Component Value Date/Time   WBC 10.1 08/15/2020 0308   RBC 4.35 08/15/2020 0308   HGB 13.3 08/15/2020 0308   HGB 12.7 (L) 01/07/2020 1007   HGB 13.0 04/30/2019 0813   HGB 14.5 10/24/2016 1440   HCT 41.5 08/15/2020 0308   HCT 40.8 04/30/2019 0813   HCT 43.8 10/24/2016 1440   PLT 345 08/15/2020 0308   PLT 147 (L) 01/07/2020 1007   PLT 187 04/30/2019 0813   MCV 95.4 08/15/2020 0308   MCV 91 04/30/2019 0813   MCV 92.4 10/24/2016 1440   MCH 30.6 08/15/2020 0308   MCHC 32.0 08/15/2020 0308   RDW 14.8 08/15/2020 0308   RDW 15.5 (H) 04/30/2019 0813   RDW 15.2 (H) 10/24/2016 1440   LYMPHSABS 1.2 08/12/2020 0459   LYMPHSABS 1.0 04/30/2019 0813   LYMPHSABS 1.7 10/24/2016 1440   MONOABS 0.8 08/12/2020 0459   MONOABS 0.9 10/24/2016 1440   EOSABS 0.2 08/12/2020 0459   EOSABS 0.1 04/30/2019 0813   BASOSABS 0.1 08/12/2020 0459   BASOSABS 0.1 04/30/2019 0813   BASOSABS 0.1 10/24/2016 1440    BMET    Component Value Date/Time   NA 134 (L) 08/18/2020 0148   NA 141 11/11/2019 1506   NA 137 10/24/2016 1440   K 3.9 08/18/2020 0148   K 3.6 10/24/2016 1440   CL 98 08/18/2020 0148   CL 105 05/14/2012 0840   CO2 28 08/18/2020 0148   CO2 26 10/24/2016 1440   GLUCOSE 116 (H) 08/18/2020  0148   GLUCOSE 94 10/24/2016 1440   GLUCOSE 102 (H) 05/14/2012 0840   BUN 15 08/18/2020 0148   BUN 16 11/11/2019 1506   BUN 16.2 10/24/2016 1440   CREATININE 1.01 08/18/2020 0148   CREATININE 0.84 01/07/2020 1007   CREATININE 0.8 10/24/2016 1440   CALCIUM 9.0 08/18/2020 0148   CALCIUM 9.2 10/24/2016 1440   GFRNONAA >60 08/18/2020 0148   GFRNONAA >60 01/07/2020 1007   GFRAA 106 11/11/2019 1506   GFRAA >60 01/07/2019 1458    INR    Component Value Date/Time   INR 1.2 02/03/2020 1316     Intake/Output Summary (Last 24 hours) at 08/18/2020 0753 Last data filed at 08/17/2020 1853 Gross per 24 hour  Intake --  Output 2650 ml  Net -2650 ml     Assessment/Plan:  65 y.o. male with bilateral non healing wounds. S/p diagnostic angiogram  Wound care per podiatry Having gout flare- ordered colchicine PT/OT recommend return to CIR Pending approval Ready for discharge when approval received  Karoline Caldwell, PA-C [facility] [phone] 08/18/2020 7:53 AM  Agree with above.  Pt has Rehab bed today Can follow up PRN with [place]  Ruta Hinds, MD  [facility] [place] Office: [phone]

## 2020-08-18 NOTE — Progress Notes (Signed)
Patient ID: [name], male   DOB: 01/22/1955, 65 y.o.   MRN: [id number] Met with the patient to introduce self and the role of the nurse CM. Reviewed secondary risk factors including HF , HTN, DM/prediabetes (A1C 6.4), etc. Reviewed HH diet and dietary modifications and medications. Patient with stage 2 to buttocks, air mattress bed ordered. MASD to groin/periarea. Continue to follow along to discharge to address educational needs. Margarito Liner

## 2020-08-18 NOTE — Progress Notes (Addendum)
PMR Admission Coordinator Pre-Admission Assessment   Patient: [name] is an 65 y.o., male MRN: [id number] DOB: July 06, 1955 Height:   Weight:     Insurance Information HMO: yes     PPO:      PCP:      IPA:      80/20:      OTHER: PRIMARY: Blue Medicare       Policy#: [id number]       Subscriber: yes CM Name: Larene Beach      Phone#: [phone]     Fax#: [phone] Larene Beach with [place] called with authorization on 08/18/20  for 8/9-8/18, with updates due on 5/40 Pre-Cert#:  [id number]    Employer:  Benefits:  Phone #: [phone]     Name: [name] Date: 08/10/2020 - still active Deductible: $0 OOP Max: $3,900 ($0 met) CIR: $335/day co-pay for days 1-6 SNF: $0.00 Copayment per day for days 1-20; $188 Copayment per day for days 21-60; $0.00 copayment for days 61-100 Maximum of 100 days/benefit period Outpatient:  $40 copay/visit Home Health:  100% coverage DME: 80% coverage, 20% co-insurance Financial Counselor:       Phone#: Providers: in network.  SECONDARY:  The "Data Collection Information Summary" for patients in Inpatient Rehabilitation Facilities with attached "Privacy Act [place] Records" was provided and verbally reviewed with: Patient   Emergency Contact Information Contact Information       Name Relation Home Work Mobile    [name] Friend [phone]   [phone]    [name] Sister   [phone]           Current Medical History  Patient Admitting Diagnosis: Transmetatarsal Amputation History of Present Illness: [name]. Slinger is a 65 year old male with history of follicular lymphoma, morbid obesity-BMI 46, A fib, recent ED visits for CHF exacerbation, left great toe amputation who was admitted on 08/04/20 with acute osteomyelitis of left 2nd and 3rd toe and gangrenous changes of left 2nd toe. He was started on IV vanc and underwent left transmetatarsal ampuation by Dr. Jacqualyn Posey on 07/26. Post op to be NWB with CAM boot and  IV antibiotids transitioned to Augmentin/doxycycline for 2 weeks. PT/OT consulted and patient  noted to have difficulty maintaining NWB with standing, fatigue and limited mobility. CIR recommended due to functional decline. Pt. Was initially admitted to [facility] 08/11/20, then transferred off to acute hospital for abdominal aortogram with bilateral lower extremity runoff 08/14/20. Pt. was re-evaluated by PT and OT and they recommend return to CIR.   Patient's medical record from [facility] has been reviewed by the rehabilitation admission coordinator and physician.   Past Medical History      Past Medical History:  Diagnosis Date   Acute systolic HF (heart failure) (HCC)     Arthritis      lt foot, hips knees   Atrial fibrillation (HCC)     Diabetes mellitus, new onset ([place])     Diverticulosis     Dyslipidemia     Gout attack 01/16/2012   Hypertension     Internal hemorrhoids     Lymphoma ([place])      remission for about 2 years, chemo 3 years prior   Pneumonia due to COVID-19 virus 07/2018   Pulmonary hypertension ([place])     Sepsis ([place]) 02/2017    secondary to influenza; requiring trach   Tubular adenoma of colon     Venous stasis ulcers ([place])        Family History  family history includes Arthritis in his mother; CVA (age of onset: 91) in his sister; Dementia in his mother; Dementia (age of onset: 72) in his brother; Hypertension in his mother.   Prior Rehab/Hospitalizations Has the patient had prior rehab or hospitalizations prior to admission? Yes   Has the patient had major surgery during 100 days prior to admission? Yes              Current Medications   Current Facility-Administered Medications:   0.9 %  sodium chloride infusion, 250 mL, Intravenous, PRN, Fields, Jessy Oto, MD   acetaminophen (TYLENOL) tablet 650 mg, 650 mg, Oral, Q4H PRN, Elam Dutch, MD, 650 mg at 08/15/20 2005   albuterol (PROVENTIL) (2.5 MG/3ML) 0.083% nebulizer solution 3 mL, 3 mL,  Inhalation, Q6H PRN, Cherre Robins, MD   amoxicillin-clavulanate (AUGMENTIN) 875-125 MG per tablet 1 tablet, 1 tablet, Oral, BID, Cherre Robins, MD, 1 tablet at 08/16/20 8921   apixaban (ELIQUIS) tablet 5 mg, 5 mg, Oral, BID, Cherre Robins, MD, 5 mg at 08/16/20 1941   atorvastatin (LIPITOR) tablet 20 mg, 20 mg, Oral, Daily, Cherre Robins, MD, 20 mg at 08/16/20 7408   carvedilol (COREG) tablet 12.5 mg, 12.5 mg, Oral, BID, Cherre Robins, MD, 12.5 mg at 08/16/20 1448   doxycycline (VIBRA-TABS) tablet 100 mg, 100 mg, Oral, BID, Cherre Robins, MD, 100 mg at 08/16/20 1856   hydrALAZINE (APRESOLINE) injection 5 mg, 5 mg, Intravenous, Q20 Min PRN, Fields, Jessy Oto, MD   HYDROcodone-acetaminophen ([place]) 10-325 MG per tablet 1-2 tablet, 1-2 tablet, Oral, Q6H PRN, Cherre Robins, MD, 1 tablet at 08/16/20 3149   labetalol (NORMODYNE) injection 10 mg, 10 mg, Intravenous, Q10 min PRN, Fields, Jessy Oto, MD   losartan (COZAAR) tablet 50 mg, 50 mg, Oral, Daily, Cherre Robins, MD, 50 mg at 08/16/20 7026   morphine 2 MG/ML injection 2 mg, 2 mg, Intravenous, Q1H PRN, Elam Dutch, MD   ondansetron [facility]) injection 4 mg, 4 mg, Intravenous, Q6H PRN, Elam Dutch, MD   oxyCODONE (Oxy IR/ROXICODONE) immediate release tablet 5-10 mg, 5-10 mg, Oral, Q4H PRN, Elam Dutch, MD, 5 mg at 08/15/20 2005   pantoprazole (PROTONIX) EC tablet 40 mg, 40 mg, Oral, Daily, Cherre Robins, MD, 40 mg at 08/16/20 3785   potassium chloride SA (KLOR-CON) CR tablet 20 mEq, 20 mEq, Oral, Daily, Cherre Robins, MD, 20 mEq at 08/16/20 8850   sertraline (ZOLOFT) tablet 50 mg, 50 mg, Oral, Daily, Cherre Robins, MD, 50 mg at 08/16/20 2774   sodium chloride flush (NS) 0.9 % injection 3 mL, 3 mL, Intravenous, Q12H, Fields, Charles E, MD, 3 mL at 08/16/20 0926   sodium chloride flush (NS) 0.9 % injection 3 mL, 3 mL, Intravenous, PRN, Elam Dutch, MD   torsemide [facility]) tablet 20 mg, 20 mg, Oral,  Daily, Cherre Robins, MD, 20 mg at 08/16/20 1287   Facility-Administered Medications Ordered in Other Encounters:   sodium chloride 0.9 % injection 10 mL, 10 mL, Intravenous, PRN, Curt Bears, MD, 10 mL at 05/04/15 1529   Patients Current Diet:  Diet Order                  Diet Carb Modified Fluid consistency: Thin; Room service appropriate? Yes  Diet effective now                         Precautions /  Restrictions Precautions Precautions: Fall Precaution Comments: venous stasis, NWB LLE w/ DARCO Other Brace: L darco shoe Restrictions Weight Bearing Restrictions: Yes LLE Weight Bearing: Non weight bearing Other Position/Activity Restrictions: Pt has difficulty maintaining weight bearing in standing and fatigues quickly.    Has the patient had 2 or more falls or a fall with injury in the past year? No   Prior Activity Level   Pt. Was active in the community, working prior to admission   Prior Functional Level Self Care: Did the patient need help bathing, dressing, using the toilet or eating? Independent   Indoor Mobility: Did the patient need assistance with walking from room to room (with or without device)? Independent   Stairs: Did the patient need assistance with internal or external stairs (with or without device)? Independent   Functional Cognition: Did the patient need help planning regular tasks such as shopping or remembering to take medications? Independent   Home Assistive Devices / Equipment Home Equipment: Walker - 2 wheels, Cane - single point   Prior Device Use: Indicate devices/aids used by the patient prior to current illness, exacerbation or injury? None of the above   Current Functional Level Cognition   Overall Cognitive Status: Within Functional Limits for tasks assessed Orientation Level: Oriented X4 General Comments: Pt is motivated to get better and home    Extremity Assessment (includes Sensation/Coordination)   Upper Extremity  Assessment: Overall WFL for tasks assessed  Lower Extremity Assessment: Defer to PT evaluation LLE Deficits / Details: NWB L LE, did not assess MMT on this side LLE Coordination: decreased gross motor     ADLs   Overall ADL's : Needs assistance/impaired Eating/Feeding: Set up, Sitting Grooming: Set up, Sitting Upper Body Bathing: Set up, Sitting Lower Body Bathing: Maximal assistance, Sit to/from stand Upper Body Dressing : Set up, Sitting Lower Body Dressing: Maximal assistance, Sit to/from stand Toilet Transfer: Moderate assistance, Stand-pivot Toileting- Clothing Manipulation and Hygiene: Maximal assistance, Sit to/from stand Scientist, research (medical): Moderate assistance, Stand-pivot Functional mobility during ADLs: Moderate assistance, Rolling walker General ADL Comments: Pt requiring mod A for all transfers and set up for UB ADL's and Max A for LB ADL's when having to complete sit<>stands and maintaining balance     Mobility   Overal bed mobility: Needs Assistance Bed Mobility: Rolling, Supine to Sit, Sit to Supine Rolling: Supervision Supine to sit: Supervision Sit to supine: Supervision General bed mobility comments: Sup for safety, increased time and use of bed rails     Transfers   Overall transfer level: Needs assistance Equipment used: Rolling walker (2 wheeled) Transfers: Sit to/from Stand, Lateral/Scoot Transfers Sit to Stand: Mod assist  Lateral/Scoot Transfers: Min guard General transfer comment: on first attempt needed [place] to boost all the way to upright standing, but able to perform with MinA on second attempt. Inconsistent with maintaining WB status L LE. Able to perform lateral scoots up EOB with min guard while maintaining NWB L LE     Ambulation / Gait / Stairs / Wheelchair Mobility   Ambulation/Gait General Gait Details: deferred     Posture / Balance Balance Overall balance assessment: Needs assistance Sitting-balance support: Bilateral upper extremity  supported, Feet supported Sitting balance-Leahy Scale: Good Standing balance support: Bilateral upper extremity supported Standing balance-Leahy Scale: Poor     Special needs/care consideration Skin Sugical incision L foot, Cracking of bilateral heels, ecchymosis of the arm and Special service needs needs bariatric equipment, NWB on LLE    Previous Home Environment (  from acute therapy documentation) Living Arrangements: Alone  Lives With: Alone Available Help at Discharge: Friend(s), Available PRN/intermittently, Family Type of Home: House Home Layout: Bed/bath upstairs, Able to live on main level with bedroom/bathroom Home Access: Stairs to enter Entrance Stairs-Rails: None Entrance Stairs-Number of Steps: 1 Bathroom Shower/Tub: Public librarian, Multimedia programmer: Standard Bathroom Accessibility: Yes How Accessible: Accessible via walker   Discharge Living Setting Plans for Discharge Living Setting: Patient's home Type of Home at Discharge: House Discharge Home Layout: One level Discharge Home Access: Stairs to enter Discharge Bathroom Shower/Tub: [place] unit Discharge Bathroom Toilet: Standard Discharge Bathroom Accessibility: Yes How Accessible: Accessible via walker   [place] Patient Roles: Other (Comment) Contact Information: [phone] Anticipated Caregiver: Mariea Stable Anticipated Caregiver's Contact Information: [phone] Ability/Limitations of Caregiver: can provide min A Caregiver Availability: 24/7 Discharge Plan Discussed with Primary Caregiver: Yes Is Caregiver In Agreement with Plan?: Yes   Goals Patient/Family Goal for Rehab: PT/OT Supervision Expected length of stay: 12-14 days Pt/Family Agrees to Admission and willing to participate: Yes Program Orientation Provided & Reviewed with Pt/Caregiver Including Roles  & Responsibilities: Yes   Decrease burden of Care through IP rehab admission: Specialzed  equipment needs, Decrease number of caregivers, Bowel and bladder program, and Patient/family education     Patient Condition: I have reviewed medical records from [facility], spoken with CM, and patient. I met with patient at the bedside for inpatient rehabilitation assessment.  Patient will benefit from ongoing PT and OT, can actively participate in 3 hours of therapy a day 5 days of the week, and can make measurable gains during the admission.  Patient will also benefit from the coordinated team approach during an Inpatient Acute Rehabilitation admission.  The patient will receive intensive therapy as well as Rehabilitation physician, nursing, social worker, and care management interventions.  Due to safety, skin/wound care, disease management, medication administration, pain management, and patient education the patient requires 24 hour a day rehabilitation nursing.  The patient is currently min-max A with mobility and basic ADLs.  Discharge setting and therapy post discharge at home with home health is anticipated.  Patient has agreed to participate in the Acute Inpatient Rehabilitation Program and will admit today.   Preadmission Screen Completed By:  Genella Mech, 08/16/2020 9:32 AM ______________________________________________________________________   Discussed status with Dr. Dagoberto Ligas on 08/18/20 at 62 and received approval for admission today.   Admission Coordinator:  Genella Mech, CCC-SLP, time 930/Date 08/18/20    Assessment/Plan: Diagnosis: s/p transmetatarsal fracture Does the need for close, 24 hr/day Medical supervision in concert with the patient's rehab needs make it unreasonable for this patient to be served in a less intensive setting? Yes Co-Morbidities requiring supervision/potential complications: morbid obesity (BMI 88.87), grade 2 follicular lymphoma, gout attack, pressure injury of skin, peripheral arterial disease Due to bladder management, bowel  management, safety, skin/wound care, disease management, medication administration, pain management, and patient education, does the patient require 24 hr/day rehab nursing? Yes Does the patient require coordinated care of a physician, rehab nurse, PT, OT to address physical and functional deficits in the context of the above medical diagnosis(es)? Yes Addressing deficits in the following areas: balance, endurance, locomotion, strength, transferring, bowel/bladder control, bathing, dressing, feeding, grooming, toileting, and psychosocial support Can the patient actively participate in an intensive therapy program of at least 3 hrs of therapy 5 days a week? Yes The potential for patient to make measurable gains while on inpatient rehab is excellent Anticipated  functional outcomes upon discharge from inpatient rehab: modified independent PT, modified independent OT, independent SLP Estimated rehab length of stay to reach the above functional goals is: 2-3 weeks Anticipated discharge destination: Home 10. Overall Rehab/Functional Prognosis: excellent     MD Signature: Leeroy Cha, MD

## 2020-08-18 NOTE — H&P (Signed)
Physical Medicine and Rehabilitation Admission H&P    CC: Functional deficits due to Left transmet amputation/NWB status.    HPI: [name]. [name] is a 65 year old male with history of follicular lymphoma, morbid obesity- BMI 47, A. fib, recent ED visits for CHF exacerbation, left great toe amputation who was admitted on 08/04/2020 with acute osteomyelitis of left second and third toe with gangrenous changes of second toe.  He was started on IV vancomycin and underwent left transverse metatarsal amputation by [name] on 07/26.  Postop to be NWB with CAM boot and IV antibiotics transition to Augmentin/doxycycline for 2 weeks duration.  He was admitted to CIR on 08/02 due to functional deficits.  He was noted to have difficulty maintaining NWB but also was noted to have a venous stasis with dry ulcers on right second and third toes dorsum.  Vascular was consulted for input and recommended angiogram to evaluate circulation.  He was discharged to acute floor on 08/05 for abdominal aortogram with bilateral lower extremity runoff by Dr. Oneida Alar.  This was negative for flow-limiting lesion and had element of small vessel disease short segment occlusion distal anterior tibial dorsalis pedis on left foot with posterior tibial artery flow.  Dr. Oneida Alar recommended amputation of second and third toe on right foot if the wounds do not heal or continue to deteriorate.  PT OT evaluations were completed showing ongoing functional deficits.  Patient was cleared to resume his CIR program. He has no complaints currently.    Review of Systems  Constitutional: Negative.   HENT: Negative.    Eyes: Negative.   Respiratory: Negative.    Cardiovascular:  Positive for leg swelling.  Gastrointestinal: Negative.   Genitourinary: Negative.   Musculoskeletal: Negative.   Skin: Negative.   Neurological: Negative.   Endo/Heme/Allergies: Negative.   Psychiatric/Behavioral: Negative.      Past Medical History:  Diagnosis  Date   Acute systolic HF (heart failure) (HCC)    Arthritis    lt foot, hips knees    Atrial fibrillation (HCC)    Diabetes mellitus, new onset ([place])    Diverticulosis    Dyslipidemia    Gout attack 01/16/2012   Hypertension    Internal hemorrhoids    Lymphoma ([place])    remission for about 2 years, chemo 3 years prior   Pneumonia due to COVID-19 virus 07/2018   Pulmonary hypertension ([place])    Sepsis ([place]) 02/2017   secondary to influenza; requiring trach   Tubular adenoma of colon    Venous stasis ulcers ([place])     Past Surgical History:  Procedure Laterality Date   ABDOMINAL AORTOGRAM W/LOWER EXTREMITY N/A 08/14/2020   Procedure: ABDOMINAL AORTOGRAM W/LOWER EXTREMITY;  Surgeon: Elam Dutch, MD;  Location: [facility];  Service: Cardiovascular;  Laterality: N/A;   AMPUTATION TOE Left 05/08/2019   Procedure: AMPUTATION LEFT GREAT  TOE;  Surgeon: Trula Slade, DPM;  Location: [facility];  Service: Podiatry;  Laterality: Left;   BRONCHOSCOPY     ESOPHAGOGASTRODUODENOSCOPY ENDOSCOPY     Dr Royden Purl HERNIA REPAIR Left    2001   IR REMOVAL TUN ACCESS W/ PORT W/O FL MOD SED  02/03/2020   PORTA CATH INSERTION  2011   TRACHEOSTOMY  03/2017   with decannulation   TRANSMETATARSAL AMPUTATION Left 08/05/2020   Procedure: TRANSMETATARSAL AMPUTATION;  Surgeon: Trula Slade, DPM;  Location: [facility];  Service: Podiatry;  Laterality: Left;    Family  History  Problem Relation Age of Onset   Dementia Brother 82       frontal lobe    Dementia Mother    Arthritis Mother    Hypertension Mother    CVA Sister 58   Colon cancer Neg Hx    Pancreatic cancer Neg Hx    Stomach cancer Neg Hx    Rectal cancer Neg Hx    Liver cancer Neg Hx     Social History: Single. Live alone and was working as a Emergency planning/management officer. He  reports that he quit smoking about 11 years ago. His smoking use included cigarettes. He has a 10.00 pack-year smoking history. He has never used smokeless  tobacco. He reports previous alcohol use. He reports that he does not use drugs.   Allergies  Allergen Reactions   Latex Hives and Swelling   Medications Prior to Admission  Medication Sig Dispense Refill   acetaminophen (TYLENOL) 325 MG tablet Take 650 mg by mouth every 6 (six) hours as needed for moderate pain.      albuterol (PROVENTIL HFA;VENTOLIN HFA) 108 (90 Base) MCG/ACT inhaler Inhale 2 puffs into the lungs every 6 (six) hours as needed for wheezing or shortness of breath. 1 Inhaler 2   allopurinol (ZYLOPRIM) 300 MG tablet Take 300 mg by mouth daily.     amoxicillin-clavulanate (AUGMENTIN) 875-125 MG tablet Take 1 tablet by mouth 2 (two) times daily for 14 days. 28 tablet 0   apixaban (ELIQUIS) 5 MG TABS tablet TAKE 1 TABLET BY MOUTH TWICE A DAY 180 tablet 1   atorvastatin (LIPITOR) 20 MG tablet TAKE 1 TABLET BY MOUTH EVERY DAY *PT OVERDUE FOR OFFICE VISIT* 30 tablet 5   carvedilol (COREG) 12.5 MG tablet Take 1 tablet (12.5 mg total) by mouth 2 (two) times daily. 180 tablet 3   cholecalciferol (VITAMIN D) 25 MCG (1000 UNIT) tablet Take 1,000 Units by mouth daily.     colchicine 0.6 MG tablet Take 1 tablet (0.6 mg total) by mouth daily for 4 doses.     [employer] DIGESTIVE PROBIOTIC 250 MG capsule Take 500 mg by mouth daily.     doxycycline (VIBRA-TABS) 100 MG tablet Take 1 tablet (100 mg total) by mouth 2 (two) times daily for 14 days. 10 tablet 0   HYDROcodone-acetaminophen (NORCO) 10-325 MG tablet Take 1 tablet by mouth every 6 (six) hours as needed.     KLOR-CON M20 20 MEQ tablet TAKE 1 TABLET BY MOUTH EVERY DAY 90 tablet 1   losartan (COZAAR) 50 MG tablet TAKE 1 TABLET BY MOUTH EVERY MORNING AND AT BEDTIME 180 tablet 2   Omega 3 340 MG CPDR Take 1 tablet by mouth 2 (two) times daily.     omeprazole (PRILOSEC) 40 MG capsule Take 40 mg by mouth daily.      sertraline (ZOLOFT) 50 MG tablet Take 1 tablet (50 mg total) by mouth daily. 30 tablet 0   torsemide (DEMADEX) 20 MG tablet Take 2  tablets by mouth daily alternating with 1 tablet by mouth daily. 90 tablet 1   vitamin B-12 (CYANOCOBALAMIN) 1000 MCG tablet Take 1,000 mcg by mouth daily.     vitamin C (VITAMIN C) 500 MG tablet Take 1 tablet (500 mg total) by mouth 3 (three) times daily.      Drug Regimen Review  Drug regimen was reviewed and remains appropriate with no significant issues identified  Home: Home Living Family/patient expects to be discharged to:: Private residence Living Arrangements: Alone Available  Help at Discharge: Friend(s), Available PRN/intermittently, Family Type of Home: House Home Access: Stairs to enter [employer] of Steps: 1 Entrance Stairs-Rails: None Home Layout: Bed/bath upstairs, Able to live on main level with bedroom/bathroom Bathroom Shower/Tub: Tub/shower unit, Multimedia programmer: Standard Bathroom Accessibility: Yes Home Equipment: Environmental consultant - 2 wheels, Cane - single point  Lives With: Alone   Functional History: Prior Function Level of Independence: Independent with assistive device(s) 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SPC, working as Theme park manager   Functional Status:  Mobility: Bed Mobility Overal bed mobility: Needs Assistance Bed Mobility: Rolling, Supine to Sit, Sit to Supine Rolling: Supervision Supine to sit: Supervision Sit to supine: Supervision General bed mobility comments: Incr time, HOB elevated and use of bed rails Transfers Overall transfer level: Needs assistance Equipment used: Rolling walker (2 wheeled) Transfers: Sit to/from Stand, Lateral/Scoot Transfers Sit to Stand: Min assist, Mod assist  Lateral/Scoot Transfers: Min guard, From elevated surface General transfer comment: Assist to bring hips up and for balance with sit to stand. Mod assist on first attempt and min assist on subsequent attempts all with bed elevated. At times pt placing some weight on LLE. Pt able to laterally scoot up side of bed with min guard. Performed sit to stand x  7. Ambulation/Gait General Gait Details: deferred   ADL: ADL Overall ADL's : Needs assistance/impaired Eating/Feeding: Set up, Sitting Grooming: Set up, Sitting Upper Body Bathing: Set up, Sitting Lower Body Bathing: Maximal assistance, Sit to/from stand Upper Body Dressing : Set up, Sitting Lower Body Dressing: Maximal assistance, Sit to/from stand Toilet Transfer: Moderate assistance, Stand-pivot Toileting- Clothing Manipulation and Hygiene: Maximal assistance, Sit to/from stand Scientist, research (medical): Moderate assistance, Stand-pivot Functional mobility during ADLs: Moderate assistance, Rolling walker General ADL Comments: Pt requiring mod A for all transfers and set up for UB ADL's and Max A for LB ADL's when having to complete sit<>stands and maintaining balance   Cognition: Cognition Overall Cognitive Status: Within Functional Limits for tasks assessed Orientation Level: Oriented X4 Cognition Arousal/Alertness: Awake/alert Behavior During Therapy: WFL for tasks assessed/performed Overall Cognitive Status: Within Functional Limits for tasks assessed General Comments: Pt is motivated to get better and home  Physical Exam: Height '5\' 11"'$  (1.803 m), weight (!) 147.2 kg. Physical Exam Gen: no distress, normal appearing HEENT: oral mucosa pink and moist, NCAT Cardio: Reg rate Chest: normal effort, normal rate of breathing Abd: soft, non-distended Ext: bilateral LE venous stasis, peripheral edema Psych: pleasant, normal affect Skin: See MSK Neuro: Alert and oriented x4 Musculoskeletal: Left foot with dressings in place  Results for orders placed or performed during the hospital encounter of 08/18/20 (from the past 48 hour(s))  Glucose, capillary     Status: Abnormal   Collection Time: 08/18/20  4:42 PM  Result Value Ref Range   Glucose-Capillary 108 (H) 70 - 99 mg/dL    Comment: Glucose reference range applies only to samples taken after fasting for at least 8 hours.    Comment 1 Notify RN    No results found.     Medical Problem List and Plan: 1.  S/p transmetatarsal amputation of left foot  -patient may shower but incision must be covered  -ELOS/Goals: 2-3 weeks modI  -Admit to CIR 2.  Antithrombotics: -DVT/anticoagulation:  Pharmaceutical: Other (comment)--eliquis  -antiplatelet therapy: N/A 3. Pain Management: Hydrocodone prn.  4. Mood: LCSW to follow for evaluation and support.   -antipsychotic agents: N/A 5. Neuropsych: This patient is capable of making decisions on his own behalf. 6. Skin/Wound  Care: Daily dressing changes to left foot. Pressure relief measures  --will order nystatin powder for candidal rash inguinal areas.  7. Fluids/Electrolytes/Nutrition: Monitor I/O. Check lytes in am.  8. Pre-diabetes: Hgb A1c-6.4 and discussed treating as new onset DM  --monitor BS ac/ch. CM diet 9.  Chronic systolic CHF: Compensated. Will monitor weights daily. Low salt diet. --continue Demadex, Lipitor, Coreg and Losartan.  10. Hypoalbuminemia: Will add juven bid to promote wound healing. 11. Gout flare: Started on colchicine on 08/09  12.  Osteomyelitis left toes s/p amputation: To continue Augmentin/doxycycline for 2 weeks--end date 08/14. Vitamin D level ordered.  13. Hyponatremia: Stable. Recheck in am.  14. A fib: Monitor HR tid--continue coreg and eliquis. Check magnesium level tomorrow.  15. Peripheral arterial disease: start nitrodur 0.'1mg'$  transdermal patch on left tibia.   I have personally performed a face to face diagnostic evaluation, including, but not limited to relevant history and physical exam findings, of this patient and developed relevant assessment and plan.  Additionally, I have reviewed and concur with the physician assistant's documentation above.  Bary Leriche, PA-C   Izora Ribas, MD 08/18/2020

## 2020-08-18 NOTE — Progress Notes (Signed)
RT set up CPAP at patient's bedside. Patient not ready to wear at this time. RT instructed patient to have RN call RT when he is ready to wear CPAP. RT will monitor as needed.

## 2020-08-18 NOTE — Progress Notes (Signed)
Inpatient Rehabilitation Medication Review by a Pharmacist  A complete drug regimen review was completed for this patient to identify any potential clinically significant medication issues.  Clinically significant medication issues were identified:  yes   Type of Medication Issue Identified Description of Issue Urgent (address now) Non-Urgent (address on AM team rounds) Plan Plan Accepted by Provider? (Yes / No / Pending AM Rounds)  Drug Interaction(s) (clinically significant)       Duplicate Therapy  Both hydrocodone and oxycodone ordered for pain management Urgent Notified provider of duplicate therapy Pending response  Allergy       No Medication Administration End Date  Added end of therapy back to antibiotic orders Non-Urgent Augmentin and Doxycyline now scheduled to complete on 8/14 Yes  Incorrect Dose  torsemide PTA was ordered '20mg'$  take 2 tablets alternating with 1 tablet daily but had only been given once daily while inpatient while reordered for rehab as the 2 tabs/1 tab regimen Non-Urgent Will leave in hand-off for AM Rph to discuss with team during rounds Pending AM Rounds  Additional Drug Therapy Needed       Other  Coreg had been held the past 2 days due to HR in 40/50s Urgent Discussed with provider, added monitoring parameters to order Yes   Insulin aspart/detemir had been discontinued by primary team 8/2 due to blood sugars WNL but reordered for rehab visit Non-Urgent Will leave in hand-off for AM Rph to discuss with team during rounds Pending AM Rounds    Name of provider notified for urgent issues identified: Reesa Chew   Provider Method of Notification: [facility] Secure Chat     For non-urgent medication issues to be resolved on team rounds tomorrow morning: details of follow-up were added to RX dialog in the follow-up/planning section for AM pharmacist   Pharmacist comments: urgent medication issues were discussed with provider, remaining issues were agreed to be  readdressed in AM during team rounds.  Time spent performing this drug regimen review (minutes):  30 minutes   Thank you for allowing pharmacy to be a part of this patient's care.  [place] Pharmacist 08/18/2020 4:20 PM

## 2020-08-19 DIAGNOSIS — Z89432 Acquired absence of left foot: Secondary | ICD-10-CM | POA: Diagnosis not present

## 2020-08-19 LAB — CBC WITH DIFFERENTIAL/PLATELET
Abs Immature Granulocytes: 0.07 10*3/uL (ref 0.00–0.07)
Basophils Absolute: 0.1 10*3/uL (ref 0.0–0.1)
Basophils Relative: 1 %
Eosinophils Absolute: 0.3 10*3/uL (ref 0.0–0.5)
Eosinophils Relative: 3 %
HCT: 39.7 % (ref 39.0–52.0)
Hemoglobin: 13 g/dL (ref 13.0–17.0)
Immature Granulocytes: 1 %
Lymphocytes Relative: 15 %
Lymphs Abs: 1.4 10*3/uL (ref 0.7–4.0)
MCH: 31.4 pg (ref 26.0–34.0)
MCHC: 32.7 g/dL (ref 30.0–36.0)
MCV: 95.9 fL (ref 80.0–100.0)
Monocytes Absolute: 0.9 10*3/uL (ref 0.1–1.0)
Monocytes Relative: 9 %
Neutro Abs: 6.9 10*3/uL (ref 1.7–7.7)
Neutrophils Relative %: 71 %
Platelets: 280 10*3/uL (ref 150–400)
RBC: 4.14 MIL/uL — ABNORMAL LOW (ref 4.22–5.81)
RDW: 14.6 % (ref 11.5–15.5)
WBC: 9.7 10*3/uL (ref 4.0–10.5)
nRBC: 0 % (ref 0.0–0.2)

## 2020-08-19 LAB — COMPREHENSIVE METABOLIC PANEL
ALT: 28 U/L (ref 0–44)
AST: 32 U/L (ref 15–41)
Albumin: 2.8 g/dL — ABNORMAL LOW (ref 3.5–5.0)
Alkaline Phosphatase: 135 U/L — ABNORMAL HIGH (ref 38–126)
Anion gap: 8 (ref 5–15)
BUN: 17 mg/dL (ref 8–23)
CO2: 28 mmol/L (ref 22–32)
Calcium: 9.2 mg/dL (ref 8.9–10.3)
Chloride: 100 mmol/L (ref 98–111)
Creatinine, Ser: 0.91 mg/dL (ref 0.61–1.24)
GFR, Estimated: >60 mL/min (ref >60)
Glucose, Bld: 110 mg/dL — ABNORMAL HIGH (ref 70–99)
Potassium: 4.4 mmol/L (ref 3.5–5.1)
Sodium: 136 mmol/L (ref 135–145)
Total Bilirubin: 1.2 mg/dL (ref 0.3–1.2)
Total Protein: 7.1 g/dL (ref 6.5–8.1)

## 2020-08-19 LAB — VITAMIN D 25 HYDROXY (VIT D DEFICIENCY, FRACTURES): Vit D, 25-Hydroxy: 63.44 ng/mL (ref 30–100)

## 2020-08-19 LAB — MAGNESIUM: Magnesium: 1.8 mg/dL (ref 1.7–2.4)

## 2020-08-19 LAB — GLUCOSE, CAPILLARY
Glucose-Capillary: 102 mg/dL — ABNORMAL HIGH (ref 70–99)
Glucose-Capillary: 109 mg/dL — ABNORMAL HIGH (ref 70–99)
Glucose-Capillary: 121 mg/dL — ABNORMAL HIGH (ref 70–99)
Glucose-Capillary: 114 mg/dL — ABNORMAL HIGH (ref 70–99)
Glucose-Capillary: 115 mg/dL — ABNORMAL HIGH (ref 70–99)

## 2020-08-19 MED ORDER — NITROGLYCERIN 0.1 MG/HR TD PT24
0.1000 mg | MEDICATED_PATCH | Freq: Every day | TRANSDERMAL | Status: DC
  Administered 2020-08-19 – 2020-08-27 (×9): 0.1 mg via TRANSDERMAL
  Filled 2020-08-19 (×9): qty 1

## 2020-08-19 MED ORDER — HYDROCODONE-ACETAMINOPHEN 10-325 MG PO TABS
1.0000 | ORAL_TABLET | Freq: Four times a day (QID) | ORAL | Status: DC | PRN
  Administered 2020-08-19: 1 via ORAL
  Filled 2020-08-19 (×2): qty 1

## 2020-08-19 NOTE — Evaluation (Addendum)
Occupational Therapy Assessment and Plan  Patient Details  Name: [name] MRN: [id number] Date of Birth: [birth year]  OT Diagnosis: abnormal posture, muscle weakness (generalized), and swelling of limb Rehab Potential: Rehab Potential (ACUTE ONLY): Good ELOS: 2-3 weeks   Today's Date: 08/19/2020 OT Individual Time: 4098-1191 OT Individual Time Calculation (min): 55 min     Hospital Problem: Principal Problem:   Status post transmetatarsal amputation of left foot ([place])   Past Medical History:  Past Medical History:  Diagnosis Date   Acute systolic HF (heart failure) ([place])    Arthritis    lt foot, hips knees    Atrial fibrillation ([place])    Diabetes mellitus, new onset ([place])    Diverticulosis    Dyslipidemia    Gout attack 01/16/2012   Hypertension    Internal hemorrhoids    Lymphoma ([place])    remission for about 2 years, chemo 3 years prior   Pneumonia due to COVID-19 virus 07/2018   Pulmonary hypertension ([place])    Sepsis ([place]) 02/2017   secondary to influenza; requiring trach   Tubular adenoma of colon    Venous stasis ulcers ([place])    Past Surgical History:  Past Surgical History:  Procedure Laterality Date   ABDOMINAL AORTOGRAM W/LOWER EXTREMITY N/A 08/14/2020   Procedure: ABDOMINAL AORTOGRAM W/LOWER EXTREMITY;  Surgeon: Elam Dutch, MD;  Location: [facility];  Service: Cardiovascular;  Laterality: N/A;   AMPUTATION TOE Left 05/08/2019   Procedure: AMPUTATION LEFT GREAT  TOE;  Surgeon: Trula Slade, DPM;  Location: [facility];  Service: Podiatry;  Laterality: Left;   BRONCHOSCOPY     ESOPHAGOGASTRODUODENOSCOPY ENDOSCOPY     Dr Royden Purl HERNIA REPAIR Left    2001   IR REMOVAL TUN ACCESS W/ PORT W/O FL MOD SED  02/03/2020   PORTA CATH INSERTION  2011   TRACHEOSTOMY  03/2017   with decannulation   TRANSMETATARSAL AMPUTATION Left 08/05/2020   Procedure: TRANSMETATARSAL AMPUTATION;  Surgeon: Trula Slade, DPM;  Location: [facility];  Service:  Podiatry;  Laterality: Left;    Assessment & Plan Clinical Impression: [name] is a 65 year old male with history of follicular lymphoma, morbid obesity- BMI 47, A. fib, recent ED visits for CHF exacerbation, left great toe amputation who was admitted on 08/04/2020 with acute osteomyelitis of left second and third toe with gangrenous changes of second toe.  He was started on IV vancomycin and underwent left transverse metatarsal amputation by [name] on 07/26.  Postop to be NWB with CAM boot and IV antibiotics transition to Augmentin/doxycycline for 2 weeks duration.  He was admitted to CIR on 08/02 due to functional deficits.  He was noted to have difficulty maintaining NWB but also was noted to have a venous stasis with dry ulcers on right second and third toes dorsum.  Vascular was consulted for input and recommended angiogram to evaluate circulation.  He was discharged to acute floor on 08/05 for abdominal aortogram with bilateral lower extremity runoff by Dr. Oneida Alar.  This was negative for flow-limiting lesion and had element of small vessel disease short segment occlusion distal anterior tibial dorsalis pedis on left foot with posterior tibial artery flow.  Dr. Oneida Alar recommended amputation of second and third toe on right foot if the wounds do not heal or continue to deteriorate.  PT OT evaluations were completed showing ongoing functional deficits..  Patient transferred to CIR on 08/18/2020 .    Patient currently  requires mod with basic self-care skills secondary to muscle weakness, decreased cardiorespiratoy endurance, and decreased sitting balance, decreased standing balance, decreased postural control, and decreased balance strategies, maintaining precautions.  Prior to hospitalization, patient could complete ADLs and IADLs with modified independent .  Patient will benefit from skilled intervention to increase independence with basic self-care skills prior to discharge home with care  partner.  Anticipate patient will require intermittent supervision and follow up home health.  OT - End of Session Activity Tolerance: Tolerates 30+ min activity with multiple rests Endurance Deficit: Yes Endurance Deficit Description: Reports history of deconditioning at baseline OT Assessment Rehab Potential (ACUTE ONLY): Good OT Patient demonstrates impairments in the following area(s): Balance;Safety;Edema;Skin Integrity;Endurance;Motor OT Basic ADL's Functional Problem(s): Grooming;Bathing;Dressing;Toileting OT Transfers Functional Problem(s): Toilet;Tub/Shower OT Plan OT Intensity: Minimum of 1-2 x/day, 45 to 90 minutes OT Frequency: 5 out of 7 days OT Duration/Estimated Length of Stay: 2-3 weeks OT Treatment/Interventions: Balance/vestibular training;Discharge planning;Pain management;Self Care/advanced ADL retraining;Therapeutic Activities;UE/LE Coordination activities;Visual/perceptual remediation/compensation;Therapeutic Exercise;Skin care/wound managment;Patient/family education;Functional mobility training;Disease mangement/prevention;Cognitive remediation/compensation;Community reintegration;DME/adaptive equipment instruction;Neuromuscular re-education;Psychosocial support;UE/LE Strength taining/ROM;Wheelchair propulsion/positioning;Splinting/orthotics OT Basic Self-Care Anticipated Outcome(s): mod I OT Toileting Anticipated Outcome(s): mod I OT Bathroom Transfers Anticipated Outcome(s): mod I OT Recommendation Patient destination: Home Follow Up Recommendations: Home health OT Equipment Recommended: To be determined   OT Evaluation Precautions/Restrictions  Precautions Precautions: Fall Precaution Comments: venous stasis, NWB LLE w/ [facility] Required Braces or Orthoses: Other Brace Other Brace: L darco shoe Restrictions LLE Weight Bearing: Non weight bearing Other Position/Activity Restrictions: Pt unable to come to full stance without breaking LLE NWB  precautions. General Chart Reviewed: Yes Pain Pain Assessment Pain Scale: 0-10 Pain Score: 0-No pain Home Living/Prior Functioning Home Living Family/patient expects to be discharged to:: Private residence Living Arrangements: Alone Available Help at Discharge: Friend(s), Available PRN/intermittently, Family Type of Home: House Home Access: Stairs to enter Technical brewer of Steps: 1 Entrance Stairs-Rails: None Home Layout: Bed/bath upstairs, Able to live on main level with bedroom/bathroom Bathroom Shower/Tub: Tub/shower unit, Multimedia programmer: Standard Bathroom Accessibility: Yes  Lives With: Alone IADL History 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 Prior Function Level of Independence: Requires assistive device for independence, Independent with basic ADLs  Able to Take Stairs?: Yes Driving: Yes Vocation Requirements: 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, counselling Baseline Vision/History: No visual deficits Patient Visual Report: No change from baseline Vision Assessment?: No apparent visual deficits Perception  Perception: Within Functional Limits Praxis Praxis: Intact Cognition Overall Cognitive Status: Within Functional Limits for tasks assessed Arousal/Alertness: Awake/alert Orientation Level: Person;Place;Situation Person: Oriented Place: Oriented Situation: Oriented Year: 2022 Month: August Day of Week: Correct Memory: Appears intact Immediate Memory Recall: Sock;Blue;Bed Memory Recall Sock: Without Cue Memory Recall Blue: Without Cue Memory Recall Bed: Without Cue Attention: Focused;Sustained;Selective Focused Attention: Appears intact Sustained Attention: Appears intact Selective Attention: Appears intact Awareness: Appears intact Problem Solving: Appears intact Safety/Judgment: Appears intact Sensation Sensation Light Touch:  (UTA Left foot due to dressing; but pt reports  sensation intact) Hot/Cold: Appears Intact Proprioception: Appears Intact Stereognosis: Not tested Coordination Gross Motor Movements are Fluid and Coordinated: No Fine Motor Movements are Fluid and Coordinated: Yes Coordination and Movement Description: grossly uncoordinated d/t NWB Motor  Motor Motor: Abnormal postural alignment and control Motor - Skilled Clinical Observations: grossly uncoordinated d/t NWB LLE  Trunk/Postural Assessment  Thoracic Assessment Thoracic Assessment: Exceptions to [facility] (rounded shoulders) Lumbar Assessment Lumbar Assessment:  (slight posterior pelvic tilt) Postural  Control Postural Control: Deficits on evaluation (UTA in standing due to inability to achieve full stance however anticipate delayed righting reactions)  Balance Balance Balance Assessed: Yes Static Sitting Balance Static Sitting - Balance Support: No upper extremity supported;Feet supported Static Sitting - Level of Assistance: 5: Stand by assistance Dynamic Sitting Balance Dynamic Sitting - Balance Support: Feet supported Dynamic Sitting - Level of Assistance: 5: Stand by assistance Static Standing Balance Static Standing - Level of Assistance: Not tested (comment) (unable to achieve full standing position with +2 assist) Dynamic Standing Balance Dynamic Standing - Level of Assistance: Not tested (comment) Extremity/Trunk Assessment RUE Assessment RUE Assessment: Exceptions to [facility] General Strength Comments: 4/5 RUE LUE Assessment LUE Assessment: Exceptions to [facility] Passive Range of Motion (PROM) Comments: WNL Active Range of Motion (AROM) Comments: limited composite flexion left index finger (~1 inch from pad to thenar eminence) possibly secondary to symptomatic gout IF MCP joint appearing red and moderately swollen General Strength Comments: 4/5 left shoulder; 5/5 elbow flex/ext; 4-/5 left grip  Care Tool Care Tool Self Care Eating   Eating Assist Level: Set up assist    Oral  Care    Oral Care Assist Level: Set up assist    Bathing   Body parts bathed by patient: Right arm;Left arm;Chest;Abdomen;Front perineal area;Right upper leg;Left upper leg;Face;Buttocks          Upper Body Dressing(including orthotics)   What is the patient wearing?: Pull over shirt   Assist Level: Set up assist    Lower Body Dressing (excluding footwear)   What is the patient wearing?: Pants Assist for lower body dressing: Maximal Assistance - Patient 25 - 49%    Putting on/Taking off footwear   What is the patient wearing?: Non-skid slipper socks;Orthosis (darco shoe left foot) Assist for footwear: Dependent - Patient 0%       Care Tool Toileting Toileting activity   Assist for toileting: Moderate Assistance - Patient 50 - 74%     Care Tool Bed Mobility Roll left and right activity        Sit to lying activity        Lying to sitting edge of bed activity         Care Tool Transfers Sit to stand transfer        Chair/bed transfer         Toilet transfer   Assist Level: Dependent - Patient 0%     Care Tool Cognition Expression of Ideas and Wants Expression of Ideas and Wants: Without difficulty (complex and basic) - expresses complex messages without difficulty and with speech that is clear and easy to understand   Understanding Verbal and Non-Verbal Content Understanding Verbal and Non-Verbal Content: Understands (complex and basic) - clear comprehension without cues or repetitions   Memory/Recall Ability *first 3 days only Memory/Recall Ability *first 3 days only: Current season;Staff names and faces;That he or she is in a hospital/hospital unit    Refer to Care Plan for [place] 1 OT Short Term Goal 1 (Week 1): Pt will complete BSC/toilet transfer with LRAD and good precaution adherence with mod A OT Short Term Goal 2 (Week 1): Pt will perform LB dress w/ AE PRN with Mod A OT Short Term Goal 3 (Week 1): Pt will perform  bathing tasks with no more than Mod A with AE PRN OT Short Term Goal 4 (Week 1): Pt will perform sit <> stands in prep for ADL with  Mod A  Recommendations for other services: None    Skilled Therapeutic Intervention ADL ADL Toileting: Dependent Where Assessed-Toileting: Bed level Toilet Transfer: Other (comment) (Attempted sit to stand from elevated EOB with +2 however pt unable to achieve full stance without bearing significant weight through LLE therefore deferred due to safety concerns.) Mobility  Bed Mobility Bed Mobility: Supine to Sit;Sit to Supine;Rolling Left;Rolling Right Rolling Right: Minimal Assistance - Patient > 75% Rolling Left: Minimal Assistance - Patient > 75% Left Sidelying to Sit: Minimal Assistance - Patient >75% Supine to Sit: Minimal Assistance - Patient > 75% Sit to Supine: Minimal Assistance - Patient > 75% Transfers Sit to Stand: Moderate Assistance - Patient 50-74% Stand to Sit: Moderate Assistance - Patient 50-74%   Discharge Criteria: Patient will be discharged from OT if patient refuses treatment 3 consecutive times without medical reason, if treatment goals not met, if there is a change in medical status, if patient makes no progress towards goals or if patient is discharged from hospital.  The above assessment, treatment plan, treatment alternatives and goals were discussed and mutually agreed upon: by patient  [name] 08/19/2020, 11:58 AM

## 2020-08-19 NOTE — Evaluation (Signed)
Physical Therapy Assessment and Plan  Patient Details  Name: [name] MRN: [id number] Date of Birth: 1956-01-06  PT Diagnosis: Abnormality of gait, Difficulty walking, Impaired sensation, and Muscle weakness Rehab Potential: Good ELOS: 2-3 weeks   Today's Date: 08/19/2020 PT Individual Time: 1100-1202 + 1500 - 1528 PT Individual Time Calculation (min): 62 min  + 28 min  Hospital Problem: Principal Problem:   Status post transmetatarsal amputation of left foot ([place])   Past Medical History:  Past Medical History:  Diagnosis Date   Acute systolic HF (heart failure) ([place])    Arthritis    lt foot, hips knees    Atrial fibrillation ([place])    Diabetes mellitus, new onset ([place])    Diverticulosis    Dyslipidemia    Gout attack 01/16/2012   Hypertension    Internal hemorrhoids    Lymphoma ([place])    remission for about 2 years, chemo 3 years prior   Pneumonia due to COVID-19 virus 07/2018   Pulmonary hypertension ([place])    Sepsis ([place]) 02/2017   secondary to influenza; requiring trach   Tubular adenoma of colon    Venous stasis ulcers ([place])    Past Surgical History:  Past Surgical History:  Procedure Laterality Date   ABDOMINAL AORTOGRAM W/LOWER EXTREMITY N/A 08/14/2020   Procedure: ABDOMINAL AORTOGRAM W/LOWER EXTREMITY;  Surgeon: Elam Dutch, MD;  Location: [facility];  Service: Cardiovascular;  Laterality: N/A;   AMPUTATION TOE Left 05/08/2019   Procedure: AMPUTATION LEFT GREAT  TOE;  Surgeon: Trula Slade, DPM;  Location: [facility];  Service: Podiatry;  Laterality: Left;   BRONCHOSCOPY     ESOPHAGOGASTRODUODENOSCOPY ENDOSCOPY     Dr Royden Purl HERNIA REPAIR Left    2001   IR REMOVAL TUN ACCESS W/ PORT W/O FL MOD SED  02/03/2020   PORTA CATH INSERTION  2011   TRACHEOSTOMY  03/2017   with decannulation   TRANSMETATARSAL AMPUTATION Left 08/05/2020   Procedure: TRANSMETATARSAL AMPUTATION;  Surgeon: Trula Slade, DPM;  Location: [facility];  Service:  Podiatry;  Laterality: Left;    Assessment & Plan Clinical Impression: Patient is a 65 year old male with history of follicular lymphoma, morbid obesity- BMI 47, A. fib, recent ED visits for CHF exacerbation, left great toe amputation who was admitted on 08/04/2020 with acute osteomyelitis of left second and third toe with gangrenous changes of second toe.  He was started on IV vancomycin and underwent left transverse metatarsal amputation by [name] on 07/26.  Postop to be NWB with CAM boot and IV antibiotics transition to Augmentin/doxycycline for 2 weeks duration.  He was admitted to CIR on 08/02 due to functional deficits.  He was noted to have difficulty maintaining NWB but also was noted to have a venous stasis with dry ulcers on right second and third toes dorsum.  Vascular was consulted for input and recommended angiogram to evaluate circulation.  He was discharged to acute floor on 08/05 for abdominal aortogram with bilateral lower extremity runoff by Dr. Oneida Alar.  This was negative for flow-limiting lesion and had element of small vessel disease short segment occlusion distal anterior tibial dorsalis pedis on left foot with posterior tibial artery flow.  Dr. Oneida Alar recommended amputation of second and third toe on right foot if the wounds do not heal or continue to deteriorate.  PT OT evaluations were completed showing ongoing functional deficits.  Patient was cleared to resume his CIR program.Patient transferred to CIR on  08/18/2020 .   Patient currently requires mod with mobility secondary to muscle weakness, decreased cardiorespiratoy endurance, and decreased standing balance and decreased balance strategies.  Prior to hospitalization, patient was modified independent  with mobility and lived with Alone in a House (townhome) home.  Home access is 1Stairs to enter.  Patient will benefit from skilled PT intervention to maximize safe functional mobility, minimize fall risk, and decrease caregiver  burden for planned discharge home with intermittent assist.  Anticipate patient will benefit from follow up [facility] at discharge.  PT - End of Session Activity Tolerance: Tolerates < 10 min activity, no significant change in vital signs Endurance Deficit: Yes Endurance Deficit Description: Reports history of deconditioning at baseline PT Assessment Rehab Potential (ACUTE/IP ONLY): Good PT Barriers to Discharge: Decreased caregiver support;Home environment access/layout;Wound Care;Lack of/limited family support;Insurance for SNF coverage;Weight;Weight bearing restrictions PT Barriers to Discharge Comments: NWB LLE, stage 2 wound on bottom PT Patient demonstrates impairments in the following area(s): Balance;Endurance;Motor;Pain;Safety;Sensory;Skin Integrity PT Transfers Functional Problem(s): Bed Mobility;Bed to Chair;Car PT Locomotion Functional Problem(s): Ambulation;Wheelchair Mobility;Stairs PT Plan PT Intensity: Minimum of 1-2 x/day ,45 to 90 minutes PT Frequency: 5 out of 7 days PT Duration Estimated Length of Stay: 2-3 weeks PT Treatment/Interventions: Ambulation/gait training;Balance/vestibular training;Community reintegration;Discharge planning;Disease management/prevention;DME/adaptive equipment instruction;Functional mobility training;Neuromuscular re-education;Pain management;Patient/family education;Psychosocial support;Stair training;Therapeutic Activities;Therapeutic Exercise;UE/LE Strength taining/ROM;UE/LE Coordination activities;Wheelchair propulsion/positioning;Cognitive remediation/compensation;Functional electrical stimulation;Skin care/wound management;Splinting/orthotics;Visual/perceptual remediation/compensation PT Transfers Anticipated Outcome(s): mod I with LRAD PT Locomotion Anticipated Outcome(s): Supervision short distance gait, mod I w/c PT Recommendation Follow Up Recommendations: Home health PT Patient destination: Home Equipment Recommended: To be  determined Equipment Details: bariatric RW, w/c   PT Evaluation Precautions/Restrictions Precautions Precautions: Fall Precaution Comments: venous stasis, NWB LLE w/ DARCO Required Braces or Orthoses: Other Brace Other Brace: L darco shoe Restrictions Weight Bearing Restrictions: Yes LLE Weight Bearing: Non weight bearing Other Position/Activity Restrictions: Pt unable to come to full stance without breaking LLE NWB precautions. [name]   Vital Signs Pain Pain Assessment Pain Scale: 0-10 Pain Score: 0-No pain Home Living/Prior Functioning Home Living Living Arrangements: Alone Available Help at Discharge: Friend(s);Available PRN/intermittently;Family Type of Home: House (townhome) Home Access: Stairs to enter Technical brewer of Steps: 1 Entrance Stairs-Rails: None Home Layout: Bed/bath upstairs;Able to live on main level with bedroom/bathroom Bathroom Shower/Tub: Tub/shower unit;Walk-in shower Bathroom Toilet: Standard Bathroom Accessibility: Yes  Lives With: Alone Prior Function Level of Independence: Requires assistive device for independence;Independent with basic ADLs  Able to Take Stairs?: Yes Driving: Yes Vocation: Full time employment Vocation Requirements: Sits on 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[employer], working as Theme park manager Vision/Perception  Geologist, engineering: Within [place]: Materials engineer Overall Cognitive Status: Within Functional Limits for tasks assessed Arousal/Alertness: Awake/alert Orientation Level: Oriented X4 Attention: Focused;Sustained;Selective Focused Attention: Appears intact Sustained Attention: Appears intact Selective Attention: Appears intact Memory: Appears intact Immediate Memory Recall: Sock;Blue;Bed Memory Recall Sock: Without Cue Memory Recall Blue: Without Cue Memory Recall Bed: Without Cue Awareness: Appears intact Problem Solving: Appears intact Safety/Judgment:  Appears intact Sensation Sensation Light Touch: Impaired Detail Peripheral sensation comments: diminished from mid-shin down Light Touch Impaired Details: Impaired RLE;Impaired LLE Hot/Cold: Not tested Proprioception: Appears Intact Stereognosis: Appears Intact Coordination Gross Motor Movements are Fluid and Coordinated: No Fine Motor Movements are Fluid and Coordinated: Yes Coordination and Movement Description: grossly uncoordinated d/t NWB Motor  Motor Motor: Abnormal postural alignment and control Motor - Skilled Clinical Observations: grossly uncoordinated d/t NWB LLE  Trunk/Postural Assessment  Cervical  Assessment Cervical Assessment: Within Functional Limits Thoracic Assessment Thoracic Assessment: Exceptions to [facility] (rounded shoulders) Lumbar Assessment Lumbar Assessment: Exceptions to [facility] Postural Control Postural Control: Deficits on evaluation  Balance Balance Balance Assessed: Yes Static Sitting Balance Static Sitting - Balance Support: No upper extremity supported;Feet supported Static Sitting - Level of Assistance: 5: Stand by assistance Dynamic Sitting Balance Dynamic Sitting - Balance Support: Feet supported Dynamic Sitting - Level of Assistance: 5: Stand by assistance Static Standing Balance Static Standing - Balance Support: Bilateral upper extremity supported Static Standing - Level of Assistance: 4: Min assist Dynamic Standing Balance Dynamic Standing - Balance Support: During functional activity;Bilateral upper extremity supported Dynamic Standing - Level of Assistance: 3: Mod assist Extremity Assessment  RUE Assessment RUE Assessment: Exceptions to [facility] [name] Strength Comments: 4/5 RUE LUE Assessment LUE Assessment: Exceptions to [facility] Passive Range of Motion (PROM) Comments: WNL Active Range of Motion (AROM) Comments: limited composite flexion left index finger (~1 inch from pad to thenar eminence) possibly secondary to symptomatic gout IF MCP  joint appearing red and moderately swollen [name] Strength Comments: 4/5 left shoulder; 5/5 elbow flex/ext; 4-/5 left grip RLE Assessment RLE Assessment: Within Functional Limits LLE Assessment LLE Assessment: Exceptions to [facility] [name] Strength Comments: 4-/5 grossly, 3/5 at ankle  Care Tool Care Tool Bed Mobility Roll left and right activity   Roll left and right assist level: Minimal Assistance - Patient > 75%    Sit to lying activity   Sit to lying assist level: Minimal Assistance - Patient > 75%    Lying to sitting edge of bed activity   Lying to sitting edge of bed assist level: Minimal Assistance - Patient > 75%     Care Tool Transfers Sit to stand transfer   Sit to stand assist level: Moderate Assistance - Patient 50 - 74% Sit to stand assistive device: Walker  Chair/bed transfer   Chair/bed transfer assist level: Moderate Assistance - Patient 50 - 74% Chair/bed transfer assistive device: Engineering geologist transfer activity did not occur: Safety/medical concerns        Care Tool Locomotion Ambulation Ambulation activity did not occur: Safety/medical concerns        Walk 10 feet activity Walk 10 feet activity did not occur: Safety/medical concerns       Walk 50 feet with 2 turns activity Walk 50 feet with 2 turns activity did not occur: Safety/medical concerns      Walk 150 feet activity Walk 150 feet activity did not occur: Safety/medical concerns      Walk 10 feet on uneven surfaces activity Walk 10 feet on uneven surfaces activity did not occur: Safety/medical concerns      Stairs Stair activity did not occur: Safety/medical concerns        Walk up/down 1 step activity Walk up/down 1 step or curb (drop down) activity did not occur: Safety/medical concerns     Walk up/down 4 steps activity did not occuR: Safety/medical concerns  Walk up/down 4 steps activity      Walk up/down 12 steps activity Walk up/down 12 steps  activity did not occur: Safety/medical concerns      Pick up small objects from floor Pick up small object from the floor (from standing position) activity did not occur: Safety/medical concerns      Wheelchair Will patient use wheelchair at discharge?: Yes Type of Wheelchair: Manual   Wheelchair assist level: Supervision/Verbal cueing Max  wheelchair distance: 149f  Wheel 50 feet with 2 turns activity   Assist Level: Supervision/Verbal cueing  Wheel 150 feet activity   Assist Level: Minimal Assistance - Patient > 75%    Refer to Care Plan for Long Term Goals  SHORT TERM GOAL WEEK 1 PT Short Term Goal 1 (Week 1): Pt will complete bed mobility with supervision PT Short Term Goal 2 (Week 1): Pt will complete sit<>stand transfer with minA and LRAD PT Short Term Goal 3 (Week 1): Pt will complete bed<>chair transfers with minA and LRAD PT Short Term Goal 4 (Week 1): Pt will initiate pre-gait training  Recommendations for other services: None   Skilled Therapeutic Intervention Mobility Bed Mobility Bed Mobility: Supine to Sit;Sit to Supine;Rolling Left;Rolling Right Rolling Right: Minimal Assistance - Patient > 75% Rolling Left: Minimal Assistance - Patient > 75% Left Sidelying to Sit: Minimal Assistance - Patient >75% Supine to Sit: Minimal Assistance - Patient > 75% Sit to Supine: Minimal Assistance - Patient > 75% Transfers Transfers: Sit to Stand;Stand to Sit;Stand Pivot Transfers Sit to Stand: Moderate Assistance - Patient 50-74% Stand to Sit: Moderate Assistance - Patient 50-74% Stand Pivot Transfers: Moderate Assistance - Patient 50 - 74% Stand Pivot Transfer Details: Verbal cues for technique;Verbal cues for safe use of DME/AE;Verbal cues for precautions/safety;Verbal cues for sequencing;Tactile cues for weight shifting;Tactile cues for sequencing;Tactile cues for initiation;Visual cues for safe use of DME/AE Transfer (Assistive device): Rolling walker Locomotion   Gait Ambulation: No Gait Gait: No Stairs / Additional Locomotion Stairs: No Wheelchair Mobility Wheelchair Mobility: Yes Wheelchair Assistance: SChartered loss adjuster Both upper extremities Wheelchair Parts Management: Supervision/cueing Distance: 1237f Skilled Intervention:  1st session: Pt supine in bed to start session. Agreeable to PT evaluation. Pleasant and cooperative throughout, A&Ox4. Able to provide social factors and PLOF as described above. Initiated functional mobility as outlined above. Pt able to describe NWB status for LLE. Donned shorts bed level with assist for pulling over hips via rolling. Donned DARCO shoe on L foot with totalA while seated EOB. Requires minA for supine<>sit without bed features (pt will be sleeping in a recliner at home to avoid going to 2nd floor), able to sit unsupported with supervision, and requires modA for sit<>stand transfers from both elevated surfaces and w/c level to RW. Able to tolerate standing for ~30-45 seconds prior to fatigue. He's able to propel himself in w/c with supervision while using BUE's although limited by fatigue and gout pain in L 1st digit. Pt ended session seated in w/c with all needs in reach and safety belt alarm on.   Instructed pt in results of PT evaluation as detailed above, PT POC, rehab potential, rehab goals, and discharge recommendations. Additionally discussed CIR's policies regarding fall safety and use of chair alarm and/or quick release belt. Pt verbalized understanding and in agreement. Will update pt's family members as they become available.    2nd session: Pt seated in w/c to start session and agreeable to therapy. Reports feeling "strange" and RN at bedside taking vitals. All vitals WNL. Patient also reports he's spoken to PA. RN in agreement to resume therapies while monitoring symptoms. Pt propelled himself ~7537f ~49f19fth supervision in w/c. Seated rest needed due to  fatigue. Wheeled in // bars and worked on sit<>stands and standing tolerance. Requires minA for powering to rise in // bars and supervision for balance. Cues needed for compliance with NWB status for LLE as he tends to push off heel for powering  to rise but keeps LLE elevating while standing. Standing bouts lasted: 33mn5sec, 273m15sec, and 32m33msec for each stand. Pt returned to room in w/c with totalA for time and remained seated in w/c with all needs in reach at end of session, safety belt alarm on.  Discharge Criteria: Patient will be discharged from PT if patient refuses treatment 3 consecutive times without medical reason, if treatment goals not met, if there is a change in medical status, if patient makes no progress towards goals or if patient is discharged from hospital.  The above assessment, treatment plan, treatment alternatives and goals were discussed and mutually agreed upon: by patient  ChrAlger Simons, DPT 08/19/2020, 12:47 PM

## 2020-08-19 NOTE — Patient Care Conference (Signed)
Inpatient RehabilitationTeam Conference and Plan of Care Update Date: 08/18/2020   Time: 11:52 AM    Patient Name: [name]      Medical Record Number: [id number]  Date of Birth: [birth year] Sex: Male         Room/Bed: 4M13C/4M13C-01 Payor Info: Payor: [place] / Plan: BCBS MEDICARE / Product Type: *No Product type* /    Admit Date/Time:  08/18/2020  1:41 PM  Primary Diagnosis:  Status post transmetatarsal amputation of left foot [name])  Hospital Problems: Principal Problem:   Status post transmetatarsal amputation of left foot [facility])    Expected Discharge Date: Expected Discharge Date: 08/14/20 (Transfered to acute 08/14/2020)  Team Members Present: Physician leading conference: Dr. Leeroy Cha Social Worker Present: Erlene Quan, BSW Nurse Present: Dorthula Nettles, RN PT Present: Page Spiro, PT OT Present: Lillia Corporal, OT PPS Coordinator present : Gunnar Fusi, SLP     Current Status/Progress Goal Weekly Team Focus  Bowel/Bladder   continent B/B  remain continent      Swallow/Nutrition/ Hydration             ADL's   at last assessment prior to transfer to acute: Mod stand pivot to wheelchair, BSC, and shower with bariwalker, difficulty adhering to NWB, sit <> stands Mod A, LB dress Mod/Max, shower level bathing Min A  Supervision (will have family assist at first)  NA, on acute   Mobility             Communication             Safety/Cognition/ Behavioral Observations            Pain   controlled with prn's  < 3  assess q 4hr and prn   Skin   stage 2, incision to left foot  no new breakdown  assess skin q shift and prn     Discharge Planning:      Team Discussion: Unplanned discharge to acute. Continent B/B, stage 2 to bottom, incision to left foot. Pain controlled with prn's. Goals are supervision for ADL's. Supervision goals for transfers, mod I for WC level mobility. Patient on target to meet rehab goals: Unplanned  discharge to acute.  *See Care Plan and progress notes for long and short-term goals.   Revisions to Treatment Plan:  N/A  Teaching Needs: N/A  Current Barriers to Discharge: Medical stability, Wound care, Weight bearing restrictions, Medication compliance, and Pending surgery  Possible Resolutions to Barriers: Unplanned discharge to acute.     Medical Summary               I attest that I was present, lead the team conference, and concur with the assessment and plan of the team.   Cristi Loron 08/19/2020, 2:02 PM

## 2020-08-19 NOTE — Progress Notes (Signed)
PROGRESS NOTE   Subjective/Complaints: No complaints this morning Had a good night Denies pain.  Willing to try nitrodur patch today- discussed side effects to look out for.  ROS: denies pain   Objective:   No results found. Recent Labs    08/19/20 0455  WBC 9.7  HGB 13.0  HCT 39.7  PLT 280   Recent Labs    08/18/20 0148 08/19/20 0455  NA 134* 136  K 3.9 4.4  CL 98 100  CO2 28 28  GLUCOSE 116* 110*  BUN 15 17  CREATININE 1.01 0.91  CALCIUM 9.0 9.2    Intake/Output Summary (Last 24 hours) at 08/19/2020 1013 Last data filed at 08/19/2020 0900 Gross per 24 hour  Intake 240 ml  Output 1375 ml  Net -1135 ml     Pressure Injury 08/14/20 Buttocks Left;Medial Stage 2 -  Partial thickness loss of dermis presenting as a shallow open injury with a red, pink wound bed without slough. 1.4 by 0.4 (Active)  08/14/20 0210  Location: Buttocks  Location Orientation: Left;Medial  Staging: Stage 2 -  Partial thickness loss of dermis presenting as a shallow open injury with a red, pink wound bed without slough.  Wound Description (Comments): 1.4 by 0.4  Present on Admission: -- (unknown)    Physical Exam: Vital Signs Blood pressure 114/73, pulse (!) 58, temperature 97.7 F (36.5 C), temperature source Oral, resp. rate 18, height '5\' 11"'$  (1.803 m), weight (!) 145.5 kg, SpO2 98 %. Gen: no distress, normal appearing HEENT: oral mucosa pink and moist, NCAT Cardio: Bradycardia Chest: normal effort, normal rate of breathing Abd: soft, non-distended Ext: bilateral LE venous stasis, peripheral edema Psych: pleasant, normal affect Skin: See MSK Neuro: Alert and oriented x4 Musculoskeletal: Left foot with dressings in place   Assessment/Plan: 1. Functional deficits which require 3+ hours per day of interdisciplinary therapy in a comprehensive inpatient rehab setting. Physiatrist is providing close team supervision and 24  hour management of active medical problems listed below. Physiatrist and rehab team continue to assess barriers to discharge/monitor patient progress toward functional and medical goals  Care Tool:  Bathing              Bathing assist       Upper Body Dressing/Undressing Upper body dressing   What is the patient wearing?: Hospital gown only    Upper body assist Assist Level: Set up assist    Lower Body Dressing/Undressing Lower body dressing            Lower body assist       Toileting Toileting    Toileting assist Assist for toileting: Independent with assistive device Assistive Device Comment: urinal   Transfers Chair/bed transfer  Transfers assist           Locomotion Ambulation   Ambulation assist              Walk 10 feet activity   Assist           Walk 50 feet activity   Assist           Walk 150 feet activity   Assist  Walk 10 feet on uneven surface  activity   Assist           Wheelchair     Assist               Wheelchair 50 feet with 2 turns activity    Assist            Wheelchair 150 feet activity     Assist          Blood pressure 114/73, pulse (!) 58, temperature 97.7 F (36.5 C), temperature source Oral, resp. rate 18, height '5\' 11"'$  (1.803 m), weight (!) 145.5 kg, SpO2 98 %.  Medical Problem List and Plan: 1.  S/p transmetatarsal amputation of left foot             -patient may shower but incision must be covered             -ELOS/Goals: 2-3 weeks modI             -Initial CIR evaluations today 2.  Antithrombotics: -DVT/anticoagulation:  Pharmaceutical: Other (comment)--eliquis             -antiplatelet therapy: N/A 3. Postoperative pain: Decease hydrocodone to 1 tab q4H PRN.  4. Mood: LCSW to follow for evaluation and support.             -antipsychotic agents: N/A 5. Neuropsych: This patient is capable of making decisions on his own behalf. 6.  Skin/Wound Care: Daily dressing changes to left foot. Pressure relief measures             --will order nystatin powder for candidal rash inguinal areas. 7. Fluids/Electrolytes/Nutrition: Monitor I/O. Check lytes in am. 8. Pre-diabetes: Hgb A1c-6.4 and discussed treating as new onset DM             --monitor BS ac/ch. CM diet 9.  Chronic systolic CHF: Compensated. Will monitor weights daily. Low salt diet. --continue Demadex, Lipitor, Coreg and Losartan. 10. Hypoalbuminemia: Will add juven bid to promote wound healing. 11. Gout flare: Started on colchicine on 08/09 12.  Osteomyelitis left toes s/p amputation: To continue Augmentin/doxycycline for 2 weeks--end date 08/14. Vitamin D level ordered.  13. Hyponatremia: Stable. Recheck in am. 14. A fib: Monitor HR tid--continue coreg and eliquis. Check magnesium level tomorrow. 15. Peripheral arterial disease: start nitrodur 0.'1mg'$  transdermal patch on left tibia. Discussed possible side effects to look out for.     LOS: 1 days A FACE TO FACE EVALUATION WAS PERFORMED  [name] P Rikita Grabert 08/19/2020, 10:13 AM

## 2020-08-19 NOTE — Progress Notes (Signed)
Inpatient Rehabilitation  Patient information reviewed and entered into eRehab system by Hazyl Marseille Jumanah Hynson, OTR/L.   Information including medical coding, functional ability and quality indicators will be reviewed and updated through discharge.    

## 2020-08-19 NOTE — Progress Notes (Signed)
[place] Individual Statement of Services  Patient Name:  [name]  Date:  08/19/2020  Welcome to the [facility].  Our goal is to provide you with an individualized program based on your diagnosis and situation, designed to meet your specific needs.  With this comprehensive rehabilitation program, you will be expected to participate in at least 3 hours of rehabilitation therapies Monday-Friday, with modified therapy programming on the weekends.  Your rehabilitation program will include the following services:  Physical Therapy (PT), Occupational Therapy (OT), 24 hour per day rehabilitation nursing, Care Coordinator, Rehabilitation Medicine, Nutrition Services, and Pharmacy Services  Weekly team conferences will be held on Tuesday to discuss your progress.  Your Inpatient Rehabilitation Care Coordinator will talk with you frequently to get your input and to update you on team discussions.  Team conferences with you and your family in attendance may also be held.  Expected length of stay: 2-3 Weeks  Overall anticipated outcome: mod/I wheelchair level -supervision short distance with gait  Depending on your progress and recovery, your program may change. Your Inpatient Rehabilitation Care Coordinator will coordinate services and will keep you informed of any changes. Your Inpatient Rehabilitation Care Coordinator's name and contact numbers are listed  below.  The following services may also be recommended but are not provided by the [facility] will be made to provide these services after discharge if needed.  Arrangements include referral to agencies that provide these services.  Your insurance has been verified to be:  [place] primary doctor is:  [name]  Pertinent information  will be shared with your doctor and your insurance company.  Inpatient Rehabilitation Care Coordinator:  Ovidio Kin, [place] or Emilia Beck  Information discussed with and copy given to patient by: Elease Hashimoto, 08/19/2020, 10:25 AM

## 2020-08-19 NOTE — Progress Notes (Signed)
Inpatient Rehabilitation Care Coordinator Assessment and Plan Patient Details  Name: [name] MRN: [id number] Date of Birth: 03-20-1955  Today's Date: 08/19/2020  Hospital Problems: Principal Problem:   Status post transmetatarsal amputation of left foot [facility])  Past Medical History:  Past Medical History:  Diagnosis Date   Acute systolic HF (heart failure) ([place])    Arthritis    lt foot, hips knees    Atrial fibrillation ([place])    Diabetes mellitus, new onset ([place])    Diverticulosis    Dyslipidemia    Gout attack 01/16/2012   Hypertension    Internal hemorrhoids    Lymphoma ([place])    remission for about 2 years, chemo 3 years prior   Pneumonia due to COVID-19 virus 07/2018   Pulmonary hypertension ([place])    Sepsis ([place]) 02/2017   secondary to influenza; requiring trach   Tubular adenoma of colon    Venous stasis ulcers ([place])    Past Surgical History:  Past Surgical History:  Procedure Laterality Date   ABDOMINAL AORTOGRAM W/LOWER EXTREMITY N/A 08/14/2020   Procedure: ABDOMINAL AORTOGRAM W/LOWER EXTREMITY;  Surgeon: Elam Dutch, MD;  Location: [facility];  Service: Cardiovascular;  Laterality: N/A;   AMPUTATION TOE Left 05/08/2019   Procedure: AMPUTATION LEFT GREAT  TOE;  Surgeon: Trula Slade, DPM;  Location: [facility];  Service: Podiatry;  Laterality: Left;   BRONCHOSCOPY     ESOPHAGOGASTRODUODENOSCOPY ENDOSCOPY     Dr Royden Purl HERNIA REPAIR Left    2001   IR REMOVAL TUN ACCESS W/ PORT W/O FL MOD SED  02/03/2020   PORTA CATH INSERTION  2011   TRACHEOSTOMY  03/2017   with decannulation   TRANSMETATARSAL AMPUTATION Left 08/05/2020   Procedure: TRANSMETATARSAL AMPUTATION;  Surgeon: Trula Slade, DPM;  Location: [facility];  Service: Podiatry;  Laterality: Left;   Social History:  reports that he quit smoking about 11 years ago. His smoking use included cigarettes. He has a 10.00 pack-year smoking history. He has never used smokeless tobacco. He  reports previous alcohol use. He reports that he does not use drugs.  Family / Support Systems Marital Status: Single Patient Roles: Other (Comment) (sibling and friend) Other Supports: Cindy-freind [phone]  Renee-sister [phone] Anticipated Caregiver: Jenny Reichmann and Renee Ability/Limitations of Caregiver: Supervision-CGA Caregiver Availability: 24/7 Family Dynamics: Strong support via sister and friend, made aware may need 24/7 he is aware of this. he feels between friends, sister and mom they can do the care he will need  Social History Preferred language: English Religion: Baptist Cultural Background: No issues Education: Hair stylist Read: Yes Write: Yes Employment Status: Employed Name of Employer: [employer] Return to Work Plans: Plans to return if able Public relations account executive Issues: No issues Guardian/Conservator: None-according to MD pt is capable of making his own decisions while here   Abuse/Neglect Abuse/Neglect Assessment Can Be Completed: Yes Physical Abuse: Denies Verbal Abuse: Denies Sexual Abuse: Denies Exploitation of patient/patient's resources: Denies Self-Neglect: Denies  Emotional Status Pt's affect, behavior and adjustment status: Pt is motivated to do well and feels the three days ona cute made him weaker, he did not get out of bed either. He wants to be as independent as he can be and doesn't want to burden family or friends. He is motivated and will push himself while here Recent Psychosocial Issues: other health issues was here in 2019 with COVID and came to CIR, in remission from lymphoma-2 years Psychiatric History:  No issues deferred depression screening due to coping appropriately and able to verbalize his concerns and feelings. Substance Abuse History: No issues  Patient / Family Perceptions, Expectations & Goals Pt/Family understanding of illness & functional limitations: Pt is able to explain his surgery and WB  issues. He talks with the MD and feels he has a good understanding of his treatment plan going forward. 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, sibling, friend, son, etc Anticipated changes in roles/activities/participation: wants to resume these roles Pt/family expectations/goals: Pt states: " I hope to be able to move around on my own, but I need to get stronger and be able to stand first."  Sister states: " I hope he can do well and only need supervision at home."  [employer]: Other (Comment) ([place]) Premorbid Home Care/DME Agencies: Other (Comment) (has wc, rw, cane, bsc, tub bench from past admissions-Gone to OP and had HH also) Transportation available at discharge: Sister and friend can transport pt did drive PTA  Discharge Planning Living Arrangements: Alone Support Systems: Parent, Other relatives, Friends/neighbors Type of Residence: Private residence Insurance Resources: Multimedia programmer (specify) Psychologist, prison and probation services) Financial Resources: Employment Museum/gallery curator Screen Referred: No Living Expenses: Medical laboratory scientific officer Management: Patient Does the patient have any problems obtaining your medications?: No Home Management: Pt will have help from sister and friend now Patient/Family Preliminary Plans: Return home with friend and sister along with Mom assisting him.He has been through this before in 2019 and did well and is hopeful he will again. He needs to get stronger he was doing better before he went to acute for a procedure and now is weaker. Care Coordinator Barriers to Discharge: Insurance for SNF coverage, Weight bearing restrictions, Wound Care Care Coordinator Anticipated Follow Up Needs: HH/OP  Clinical Impression Pleasant gentleman who is familiar with CIR was ere in 2019 and did well. He is hopeful he will do well again, but does have assistance. Hopefully he can reach supervision-CGA level due to he is a big man. Will await therapy  evaluations and work on discharge needs.  Elease Hashimoto 08/19/2020, 10:24 AM

## 2020-08-19 NOTE — Progress Notes (Signed)
Occupational Therapy Discharge Summary  Patient Details  Name: [name] MRN: [id number] Date of Birth: 05/17/1955   Patient did not meet OT goals as pt was discharged to acute care d/t further revision of L transmetatarsal amputation. Note that all assessment for ADL/mobility tasks based off of last assessment between 08/13/20 - 08/14/20 prior to DC from CIR and back to acute care prior to most recent surgical intervention.   Reasons goals not met: transfer back to acute care for further surgical intervention   Reasons for discharge: change in medical status   OT Discharge Pain Unable to assess pain as pt transferred to acute care ADL ADL Eating: Not assessed Grooming: Setup Upper Body Bathing: Setup Lower Body Bathing: Moderate assistance (at last assessment) Upper Body Dressing: Setup (at last assessment) Lower Body Dressing: Maximal assistance (at last assessment) Toileting: Dependent (at last assessment) Toilet Transfer: Moderate assistance Toilet Transfer Method: Stand pivot (at last assessment) Toilet Transfer Equipment: Extra wide drop arm bedside commode Social research officer, government: Moderate assistance (at last assessment prior to most recent surgery) Cognition Overall Cognitive Status: Within Functional Limits for tasks assessed Problem Solving: Appears intact Sensation Coordination Gross Motor Movements are Fluid and Coordinated: No Fine Motor Movements are Fluid and Coordinated: Yes Motor  Motor Motor: Abnormal postural alignment and control Motor - Skilled Clinical Observations: grossly uncoordinated d/t NWB LLE Balance Balance Balance Assessed: Yes Static Sitting Balance Static Sitting - Balance Support: No upper extremity supported;Feet supported Static Sitting - Level of Assistance: 5: Stand by assistance Dynamic Sitting Balance Dynamic Sitting - Balance Support: Feet supported Dynamic Sitting - Level of Assistance: 5: Stand by assistance Dynamic Sitting -  Balance Activities: Lateral lean/weight shifting;Forward lean/weight shifting;Reaching for objects Static Standing Balance Static Standing - Balance Support: Bilateral upper extremity supported Static Standing - Level of Assistance: 4: Min assist Dynamic Standing Balance Dynamic Standing - Balance Support: During functional activity;Bilateral upper extremity supported Dynamic Standing - Level of Assistance: 3: Mod assist    Viona Gilmore 08/19/2020, 2:05 PM

## 2020-08-19 NOTE — Progress Notes (Signed)
Nutrition Brief Note  RD consulted for diet education. Pt unavailable during attempted time of contact. RD to plan for revisit for diet education and nutrition assessment.   Corrin Parker, MS, RD, LDN RD pager number/after hours weekend pager number on Amion.

## 2020-08-19 NOTE — Progress Notes (Signed)
RT note. Patient placed on cpap at this time

## 2020-08-20 DIAGNOSIS — Z89432 Acquired absence of left foot: Secondary | ICD-10-CM | POA: Diagnosis not present

## 2020-08-20 LAB — GLUCOSE, CAPILLARY
Glucose-Capillary: 101 mg/dL — ABNORMAL HIGH (ref 70–99)
Glucose-Capillary: 105 mg/dL — ABNORMAL HIGH (ref 70–99)
Glucose-Capillary: 108 mg/dL — ABNORMAL HIGH (ref 70–99)
Glucose-Capillary: 117 mg/dL — ABNORMAL HIGH (ref 70–99)

## 2020-08-20 MED ORDER — HYDROCODONE-ACETAMINOPHEN 10-325 MG PO TABS
1.0000 | ORAL_TABLET | Freq: Three times a day (TID) | ORAL | Status: DC | PRN
Start: 2020-08-20 — End: 2020-08-24
  Administered 2020-08-20: 1 via ORAL
  Filled 2020-08-20: qty 1

## 2020-08-20 NOTE — Progress Notes (Signed)
Occupational Therapy Session Note  Patient Details  Name: LERIN CHAPPLE MRN: [id number] Date of Birth: 04-10-1955  Today's Date: 08/20/2020 OT Individual Time: 1510-1540 OT Individual Time Calculation (min): 30 min    Short Term Goals: Week 1:  OT Short Term Goal 1 (Week 1): Pt will complete BSC/toilet transfer with LRAD and good precaution adherence with mod A OT Short Term Goal 2 (Week 1): Pt will perform LB dress w/ AE PRN with Mod A OT Short Term Goal 3 (Week 1): Pt will perform bathing tasks with no more than Min A with AE PRN OT Short Term Goal 4 (Week 1): Pt will perform sit <> stands in prep for ADL with Min A  Skilled Therapeutic Interventions/Progress Updates:    Pt in wheelchair pleasant and agreeable to therapy.  He was able to work on sit to stand transitions with use of the bariatric walker for support.  Mod assist needed for sit to stand using two different methods.  First method was with the RLE pushing off of the arm of the chair and the other on the walker.  Second method was with BUEs pushing off of the arms of the wheelchair.  Decreased ability to follow NWBing  over the LLE with either method.  He was also not able to efficiently complete heel to toe method efficiently for moving the RLE and could not hop on just the RLE.  Second part of session focused on BUE strengthening in order to increase independence with sit to stand as well as for helping to decrease weightbearing through the LLE.  Pt completed 1 set of 20 reps for BUE triceps extension and shoulder horizontal abduction, using the medium green resistance band.  He was able to complete 1 set of 15 for shoulder flexion on the RUE and a set of 13 on the left.  Encouraged pt to continue working on exercises on his own using the band, but no more than 2-3 more sets as not to cause too much soreness, affecting his ability to participate in transitional movements during tomorrows sessions.  Call button and phone in reach with  safety alarm belt in place.    Therapy Documentation Precautions:  Precautions Precautions: Fall Precaution Comments: venous stasis, NWB LLE w/ DARCO Required Braces or Orthoses: Other Brace Other Brace: L darco shoe Restrictions Weight Bearing Restrictions: Yes LLE Weight Bearing: Non weight bearing Other Position/Activity Restrictions: Pt unable to come to full stance without breaking LLE NWB precautions.  Pain: Pain Assessment Pain Scale: Faces Pain Score: 0-No pain Faces Pain Scale: Hurts a little bit ADL: See Care Tool Section for some details of mobility and selfcare  Therapy/Group: Individual Therapy  Sylver Vantassell OTR/L 08/20/2020, 4:23 PM

## 2020-08-20 NOTE — Progress Notes (Signed)
PROGRESS NOTE   Subjective/Complaints: No complaints this morning Denies pain Therapy has been going well Denies symptoms from nitrodur patch  ROS: denies pain, headache   Objective:   No results found. Recent Labs    08/19/20 0455  WBC 9.7  HGB 13.0  HCT 39.7  PLT 280   Recent Labs    08/18/20 0148 08/19/20 0455  NA 134* 136  K 3.9 4.4  CL 98 100  CO2 28 28  GLUCOSE 116* 110*  BUN 15 17  CREATININE 1.01 0.91  CALCIUM 9.0 9.2    Intake/Output Summary (Last 24 hours) at 08/20/2020 1024 Last data filed at 08/20/2020 0800 Gross per 24 hour  Intake 480 ml  Output 2025 ml  Net -1545 ml     Pressure Injury 08/14/20 Buttocks Left;Medial Stage 2 -  Partial thickness loss of dermis presenting as a shallow open injury with a red, pink wound bed without slough. 1.4 by 0.4 (Active)  08/14/20 0210  Location: Buttocks  Location Orientation: Left;Medial  Staging: Stage 2 -  Partial thickness loss of dermis presenting as a shallow open injury with a red, pink wound bed without slough.  Wound Description (Comments): 1.4 by 0.4  Present on Admission: -- (unknown)    Physical Exam: Vital Signs Blood pressure (!) 141/76, pulse 69, temperature 97.6 F (36.4 C), temperature source Oral, resp. rate 18, height '5\' 11"'$  (1.803 m), weight (!) 143.4 kg, SpO2 95 %. Gen: no distress, normal appearing HEENT: oral mucosa pink and moist, NCAT Cardio: Reg rate Chest: normal effort, normal rate of breathing  Ext: bilateral LE venous stasis, peripheral edema Psych: pleasant, normal affect Skin: See MSK Neuro: Alert and oriented x4 Musculoskeletal: Left foot with dressings in place   Assessment/Plan: 1. Functional deficits which require 3+ hours per day of interdisciplinary therapy in a comprehensive inpatient rehab setting. Physiatrist is providing close team supervision and 24 hour management of active medical problems listed  below. Physiatrist and rehab team continue to assess barriers to discharge/monitor patient progress toward functional and medical goals  Care Tool:  Bathing    Body parts bathed by patient: Right arm, Left arm, Chest, Abdomen, Front perineal area, Right upper leg, Left upper leg, Face, Buttocks   Body parts bathed by helper: Right lower leg Body parts n/a: Left lower leg   Bathing assist Assist Level: Moderate Assistance - Patient 50 - 74%     Upper Body Dressing/Undressing Upper body dressing   What is the patient wearing?: Pull over shirt    Upper body assist Assist Level: Set up assist    Lower Body Dressing/Undressing Lower body dressing      What is the patient wearing?: Pants     Lower body assist Assist for lower body dressing: Maximal Assistance - Patient 25 - 49%     Toileting Toileting    Toileting assist Assist for toileting: Independent with assistive device Assistive Device Comment: urinal   Transfers Chair/bed transfer  Transfers assist     Chair/bed transfer assist level: Moderate Assistance - Patient 50 - 74% Chair/bed transfer assistive device: Programmer, multimedia   Ambulation assist   Ambulation activity did  not occur: Safety/medical concerns          Walk 10 feet activity   Assist  Walk 10 feet activity did not occur: Safety/medical concerns        Walk 50 feet activity   Assist Walk 50 feet with 2 turns activity did not occur: Safety/medical concerns         Walk 150 feet activity   Assist Walk 150 feet activity did not occur: Safety/medical concerns         Walk 10 feet on uneven surface  activity   Assist Walk 10 feet on uneven surfaces activity did not occur: Safety/medical concerns         Wheelchair     Assist Will patient use wheelchair at discharge?: Yes Type of Wheelchair: Manual    Wheelchair assist level: Supervision/Verbal cueing Max wheelchair distance: 14f     Wheelchair 50 feet with 2 turns activity    Assist        Assist Level: Supervision/Verbal cueing   Wheelchair 150 feet activity     Assist      Assist Level: Minimal Assistance - Patient > 75%   Blood pressure (!) 141/76, pulse 69, temperature 97.6 F (36.4 C), temperature source Oral, resp. rate 18, height '5\' 11"'$  (1.803 m), weight (!) 143.4 kg, SpO2 95 %.  Medical Problem List and Plan: 1.  S/p transmetatarsal amputation of left foot             -patient may shower but incision must be covered             -ELOS/Goals: 2-3 weeks modI             -Continue CIR 2.  Antithrombotics: -DVT/anticoagulation:  Pharmaceutical: Other (comment)--eliquis             -antiplatelet therapy: N/A 3. Postoperative pain: Decrease hydrocodone to 1 tab q8H PRN.  4. Mood: LCSW to follow for evaluation and support.             -antipsychotic agents: N/A 5. Neuropsych: This patient is capable of making decisions on his own behalf. 6. Skin/Wound Care: Daily dressing changes to left foot. Pressure relief measures             --will order nystatin powder for candidal rash inguinal areas. 7. Fluids/Electrolytes/Nutrition: Monitor I/O. Check lytes in am. 8. Pre-diabetes: Hgb A1c-6.4 and discussed treating as new onset DM             --monitor BS ac/ch. CM diet  -recommended against Ensure due to its added sugar- discussed alternative foods that are good for wound healing 9.  Chronic systolic CHF: Compensated. Will monitor weights daily. Low salt diet. --continue Demadex, Lipitor, Coreg and Losartan. 10. Hypoalbuminemia: Will add juven bid to promote wound healing. 11. Gout flare: Started on colchicine on 08/09 12.  Osteomyelitis left toes s/p amputation: To continue Augmentin/doxycycline for 2 weeks--end date 08/14. Vitamin D level ordered.  13. Hyponatremia: Stable. Recheck in am. 14. A fib: Monitor HR tid--continue coreg and eliquis. Magnesium level is 1.8. Discuss with him starting  magnesium supplementation.  15. Peripheral arterial disease: start nitrodur 0.'1mg'$  transdermal patch on left tibia. Discussed possible side effects to look out for- he did not experience any.     LOS: 2 days A FACE TO FACE EVALUATION WAS PERFORMED  KMartha ClanP Tarron Krolak 08/20/2020, 10:24 AM

## 2020-08-20 NOTE — Progress Notes (Signed)
Subjective: 65 year old male status post transmetatarsal amputation left foot on August 05, 2020.  He has been transition back to inpatient rehab.  He had just worked with physical therapy and states he did well with this.  No fevers or chills any other concerns.    Objective: AAO x3, NAD On the left foot status post transmetatarsal amputation with sutures, staples intact.  There is no dehiscence.  Still some mild bloody drainage on the bandage.  No purulence.  There is no fluctuance or crepitation.  Minimal edema.  No malodor. On the right foot dorsal second and third toes of hyperkeratotic lesions which are preulcerative.  No drainage or pus or any edema, erythema. No pain with calf compression, swelling, warmth, erythema  Assessment: Status post left transmetatarsal amputation; right toe ulcerations without signs of infection  Plan: Dressing was changed today.  Xeroform was applied followed by dry sterile dressing.  Recommend daily dressing changes.  Continue Nitropatch.  Nonweightbearing left foot.  Celesta Gentile, DPM

## 2020-08-20 NOTE — Progress Notes (Signed)
Initial Nutrition Assessment  DOCUMENTATION CODES:   Morbid obesity  INTERVENTION:  Provide Juven BID, each packet provides 80 calories, 8 grams of carbohydrate, 2.5  grams of protein (collagen), 7 grams of L-arginine and 7 grams of L-glutamine; supplement contains CaHMB, Vitamins C, E, B12 and Zinc to promote wound healing  Diet education handout given.   NUTRITION DIAGNOSIS:   Increased nutrient needs related to wound healing as evidenced by estimated needs.  GOAL:   Patient will meet greater than or equal to 90% of their needs  MONITOR:   PO intake, Supplement acceptance, Skin, Weight trends, Labs, I & O's  REASON FOR ASSESSMENT:   Consult Diet education  ASSESSMENT:   65 year old male with history of follicular lymphoma,, A. fib, recent ED visits for CHF exacerbation, left great toe amputation who was admitted on 08/04/2020 with acute osteomyelitis of left second and third toe with gangrenous changes of second toe. underwent left transverse metatarsal amputation on 07/26. Pt with functional deficits admitted to CIR.  Meal completion has been 100%. RD to order nutritional supplements to aid in wound healing per MD request. Diet handout "Carbohydrate Counting for People with Diabetes" from the Academy of Nutrition and Dietetics Manual was given and placed in pt discharge instructions. Unable to complete Nutrition-Focused physical exam at this time.   Labs and medications reviewed.   Diet Order:   Diet Order             Diet Carb Modified Fluid consistency: Thin; Room service appropriate? Yes  Diet effective now                   EDUCATION NEEDS:   Education needs have been addressed  Skin:  Skin Assessment: Skin Integrity Issues: Skin Integrity Issues:: Incisions, Stage II Stage II: L buttocks Incisions: L foot  Last BM:  8/10  Height:   Ht Readings from Last 1 Encounters:  08/18/20 '5\' 11"'$  (1.803 m)    Weight:   Wt Readings from Last 1 Encounters:   08/20/20 (!) 143.4 kg   BMI:  Body mass index is 44.09 kg/m.  Estimated Nutritional Needs:   Kcal:  2000-2200  Protein:  120-135 grams  Fluid:  >/= 2 L/day  Corrin Parker, MS, RD, LDN RD pager number/after hours weekend pager number on Amion.

## 2020-08-20 NOTE — Progress Notes (Signed)
Occupational Therapy Session Note  Patient Details  Name: [name] MRN: [id number] Date of Birth: 04-17-1955  Today's Date: 08/20/2020 OT Individual Time: 1005-1105; and 1400-1430 OT Individual Time Calculation (min): 60 min; and 30 min    Short Term Goals: Week 1:  OT Short Term Goal 1 (Week 1): Pt will complete BSC/toilet transfer with LRAD and good precaution adherence with mod A OT Short Term Goal 2 (Week 1): Pt will perform LB dress w/ AE PRN with Mod A OT Short Term Goal 3 (Week 1): Pt will perform bathing tasks with no more than Min A with AE PRN OT Short Term Goal 4 (Week 1): Pt will perform sit <> stands in prep for ADL with Min A  Skilled Therapeutic Interventions/Progress Updates:    First session: Pt sitting in w/c, requesting to shave his beard at sink. No c/o pain.  Pt self propelled to sink with stand by assist to move obstacles due to tight space.  Pt shaved, brushed teeth, and washed face with setup.  Pt then requesting to use bathroom.  Transported pt via w/c to bathroom and pt completed stand pivot using RW and grab bars with mod assist for initial power up then CGA for pivot and sit. Pt noted to bear weight through left heel briefly during power up despite Tcs under calf to lift LE off floor.  Pt completed pericare in sitting with setup and clothing management with mod assist for sit to stand and CGA during standing.  Pt needing max cues to prevent weightbearing through LLE during sit<>stand. Stand pivot commode to w/c.  3 in 1 commode elevated for increased ease standing.  Washed hands seated at sinkside with setup. Call bell in reach, seat alarm on at end of session.  Second session: Pt sitting in w/c, no c/o pain.  Pt participated in sit<>stand blocked practice with emphasis on NWB through LLE during power up and descent.  Procured larger bariatric RW for increased success.  Pt able to maintain NWB status using larger RW for sit<>stand and required min assist.  Pt  attempted hopping forward and back needing CGA forward and unable to hop backward, instead scooting on right foot and needing OT to bring w/c to pt.  Call bell in reach, seat alarm on at end of session.  Therapy Documentation Precautions:  Precautions Precautions: Fall Precaution Comments: venous stasis, NWB LLE w/ DARCO Required Braces or Orthoses: Other Brace Other Brace: L darco shoe Restrictions Weight Bearing Restrictions: Yes LLE Weight Bearing: Non weight bearing Other Position/Activity Restrictions: Pt unable to come to full stance without breaking LLE NWB precautions.    Therapy/Group: Individual Therapy  [name] 08/20/2020, 10:28 AM

## 2020-08-20 NOTE — Discharge Instructions (Addendum)
Inpatient Rehab Discharge Instructions  [name] Discharge date and time:    Activities/Precautions/ Functional Status: Activity: no lifting, driving, or strenuous exercise for till cleared by MD Diet: cardiac diet and diabetic diet Wound Care:  Wash with soap and water, pat dry, place adaptic on incision, cover with dry dressing.   Functional status:  ___ No restrictions     ___ Walk up steps independently ___ 24/7 supervision/assistance   ___ Walk up steps with assistance ___ Intermittent supervision/assistance  ___ Bathe/dress independently ___ Walk with walker     ___ Bathe/dress with assistance ___ Walk Independently    ___ Shower independently ___ Walk with assistance    ___ Shower with assistance ___ No alcohol     ___ Return to work/school ________  Special Instructions: No weight on left foot   COMMUNITY REFERRALS UPON DISCHARGE:    Home Health:   PT & OT                  Agency: [facility]    Phone: [phone]  Medical Equipment/Items Ordered: [facility]                                                  Agency/Supplier:[facility]  [phone]    My questions have been answered and I understand these instructions. I will adhere to these goals and the provided educational materials after my discharge from the hospital.  Patient/Caregiver Signature _______________________________ Date __________  Clinician Signature _______________________________________ Date __________  Please bring this form and your medication list with you to all your follow-up doctor's appointments.  Carbohydrate Counting For People With Diabetes  Foods with carbohydrates make your blood glucose level go up. Learning how to count carbohydrates can help you control your blood glucose levels. First, identify the foods you eat that contain carbohydrates. Then, using the Foods with Carbohydrates chart, determine about how much carbohydrates are in your meals and snacks. Make sure  you are eating foods with fiber, protein, and healthy fat along with your carbohydrate foods. Foods with Carbohydrates The following table shows carbohydrate foods that have about 15 grams of carbohydrate each. Using measuring cups, spoons, or a food scale when you first begin learning about carbohydrate counting can help you learn about the portion sizes you typically eat. The following foods have 15 grams carbohydrate each:  Grains 1 slice bread (1 ounce)  1 small tortilla (6-inch size)   large bagel (1 ounce)  1/3 cup pasta or rice (cooked)   hamburger or hot dog bun ( ounce)   cup cooked cereal   to  cup ready-to-eat cereal  2 taco shells (5-inch size) Fruit 1 small fresh fruit ( to 1 cup)   medium banana  17 small grapes (3 ounces)  1 cup melon or berries   cup canned or frozen fruit  2 tablespoons dried fruit (blueberries, cherries, cranberries, raisins)   cup unsweetened fruit juice  Starchy Vegetables  cup cooked beans, peas, corn, potatoes/sweet potatoes   large baked potato (3 ounces)  1 cup acorn or butternut squash  Snack Foods 3 to 6 crackers  8 potato chips or 13 tortilla chips ( ounce to 1 ounce)  3 cups popped popcorn  Dairy 3/4 cup (6 ounces) nonfat plain yogurt, or yogurt with sugar-free sweetener  1 cup milk  1 cup  plain rice, soy, coconut or flavored almond milk Sweets and Desserts  cup ice cream or frozen yogurt  1 tablespoon jam, jelly, pancake syrup, table sugar, or honey  2 tablespoons light pancake syrup  1 inch square of frosted cake or 2 inch square of unfrosted cake  2 small cookies (2/3 ounce each) or  large cookie  Sometimes you'll have to estimate carbohydrate amounts if you don't know the exact recipe. One cup of mixed foods like soups can have 1 to 2 carbohydrate servings, while some casseroles might have 2 or more servings of carbohydrate. Foods that have less than 20 calories in each serving can be counted as "free" foods. Count 1  cup raw vegetables, or  cup cooked non-starchy vegetables as "free" foods. If you eat 3 or more servings at one meal, then count them as 1 carbohydrate serving.  Foods without Carbohydrates  Not all foods contain carbohydrates. Meat, some dairy, fats, non-starchy vegetables, and many beverages don't contain carbohydrate. So when you count carbohydrates, you can generally exclude chicken, pork, beef, fish, seafood, eggs, tofu, cheese, butter, sour cream, avocado, nuts, seeds, olives, mayonnaise, water, black coffee, unsweetened tea, and zero-calorie drinks. Vegetables with no or low carbohydrate include green beans, cauliflower, tomatoes, and onions. How much carbohydrate should I eat at each meal?  Carbohydrate counting can help you plan your meals and manage your weight. Following are some starting points for carbohydrate intake at each meal. Work with your registered dietitian nutritionist to find the best range that works for your blood glucose and weight.   To Lose Weight To Maintain Weight  Women 2 - 3 carb servings 3 - 4 carb servings  Men 3 - 4 carb servings 4 - 5 carb servings  Checking your blood glucose after meals will help you know if you need to adjust the timing, type, or number of carbohydrate servings in your meal plan. Achieve and keep a healthy body weight by balancing your food intake and physical activity.  Tips How should I plan my meals?  Plan for half the food on your plate to include non-starchy vegetables, like salad greens, broccoli, or carrots. Try to eat 3 to 5 servings of non-starchy vegetables every day. Have a protein food at each meal. Protein foods include chicken, fish, meat, eggs, or beans (note that beans contain carbohydrate). These two food groups (non-starchy vegetables and proteins) are low in carbohydrate. If you fill up your plate with these foods, you will eat less carbohydrate but still fill up your stomach. Try to limit your carbohydrate portion to  of the  plate.  What fats are healthiest to eat?  Diabetes increases risk for heart disease. To help protect your heart, eat more healthy fats, such as olive oil, nuts, and avocado. Eat less saturated fats like butter, cream, and high-fat meats, like bacon and sausage. Avoid trans fats, which are in all foods that list "partially hydrogenated oil" as an ingredient. What should I drink?  Choose drinks that are not sweetened with sugar. The healthiest choices are water, carbonated or seltzer waters, and tea and coffee without added sugars.  Sweet drinks will make your blood glucose go up very quickly. One serving of soda or energy drink is  cup. It is best to drink these beverages only if your blood glucose is low.  Artificially sweetened, or diet drinks, typically do not increase your blood glucose if they have zero calories in them. Read labels of beverages, as some diet drinks  do have carbohydrate and will raise your blood glucose. Label Reading Tips Read Nutrition Facts labels to find out how many grams of carbohydrate are in a food you want to eat. Don't forget: sometimes serving sizes on the label aren't the same as how much food you are going to eat, so you may need to calculate how much carbohydrate is in the food you are serving yourself.   Carbohydrate Counting for People with Diabetes Sample 1-Day Menu  Breakfast  cup yogurt, low fat, low sugar (1 carbohydrate serving)   cup cereal, ready-to-eat, unsweetened (1 carbohydrate serving)  1 cup strawberries (1 carbohydrate serving)   cup almonds ( carbohydrate serving)  Lunch 1, 5 ounce can chunk light tuna  2 ounces cheese, low fat cheddar  6 whole wheat crackers (1 carbohydrate serving)  1 small apple (1 carbohydrate servings)   cup carrots ( carbohydrate serving)   cup snap peas  1 cup 1% milk (1 carbohydrate serving)   Evening Meal Stir fry made with: 3 ounces chicken  1 cup brown rice (3 carbohydrate servings)   cup broccoli (  carbohydrate serving)   cup green beans   cup onions  1 tablespoon olive oil  2 tablespoons teriyaki sauce ( carbohydrate serving)  Evening Snack 1 extra small banana (1 carbohydrate serving)  1 tablespoon peanut butter   Carbohydrate Counting for People with Diabetes Vegan Sample 1-Day Menu  Breakfast 1 cup cooked oatmeal (2 carbohydrate servings)   cup blueberries (1 carbohydrate serving)  2 tablespoons flaxseeds  1 cup soymilk fortified with calcium and vitamin D  1 cup coffee  Lunch 2 slices whole wheat bread (2 carbohydrate servings)   cup baked tofu   cup lettuce  2 slices tomato  2 slices avocado   cup baby carrots ( carbohydrate serving)  1 orange (1 carbohydrate serving)  1 cup soymilk fortified with calcium and vitamin D   Evening Meal Burrito made with: 1 6-inch corn tortilla (1 carbohydrate serving)  1 cup refried vegetarian beans (2 carbohydrate servings)   cup chopped tomatoes   cup lettuce   cup salsa  1/3 cup brown rice (1 carbohydrate serving)  1 tablespoon olive oil for rice   cup zucchini   Evening Snack 6 small whole grain crackers (1 carbohydrate serving)  2 apricots ( carbohydrate serving)   cup unsalted peanuts ( carbohydrate serving)    Carbohydrate Counting for People with Diabetes Vegetarian (Lacto-Ovo) Sample 1-Day Menu  Breakfast 1 cup cooked oatmeal (2 carbohydrate servings)   cup blueberries (1 carbohydrate serving)  2 tablespoons flaxseeds  1 egg  1 cup 1% milk (1 carbohydrate serving)  1 cup coffee  Lunch 2 slices whole wheat bread (2 carbohydrate servings)  2 ounces low-fat cheese   cup lettuce  2 slices tomato  2 slices avocado   cup baby carrots ( carbohydrate serving)  1 orange (1 carbohydrate serving)  1 cup unsweetened tea  Evening Meal Burrito made with: 1 6-inch corn tortilla (1 carbohydrate serving)   cup refried vegetarian beans (1 carbohydrate serving)   cup tomatoes   cup lettuce   cup salsa  1/3  cup brown rice (1 carbohydrate serving)  1 tablespoon olive oil for rice   cup zucchini  1 cup 1% milk (1 carbohydrate serving)  Evening Snack 6 small whole grain crackers (1 carbohydrate serving)  2 apricots ( carbohydrate serving)   cup unsalted peanuts ( carbohydrate serving)    Copyright 2020  Academy of [employer] and  Dietetics. All rights reserved.  Using Nutrition Labels: Carbohydrate  Serving Size  Look at the serving size. All the information on the label is based on this portion. Servings Per Container  The number of servings contained in the package. Guidelines for Carbohydrate  Look at the total grams of carbohydrate in the serving size.  1 carbohydrate choice = 15 grams of carbohydrate. Range of Carbohydrate Grams Per Choice  Carbohydrate Grams/Choice Carbohydrate Choices  6-10   11-20 1  21-25 1  26-35 2  36-40 2  41-50 3  51-55 3  56-65 4  66-70 4  71-80 5    Copyright 2020  Academy of [employer] and Dietetics. All rights reserved.  Corrin Parker, MS, RD, LDN Office phone [phone]

## 2020-08-20 NOTE — Progress Notes (Signed)
Physical Therapy Session Note  Patient Details  Name: [name] MRN: [id number] Date of Birth: Jul 06, 1955  Today's Date: 08/20/2020 PT Individual Time: 0800-0855 PT Individual Time Calculation (min): 55 min   Short Term Goals: Week 1:  PT Short Term Goal 1 (Week 1): Pt will complete bed mobility with supervision PT Short Term Goal 2 (Week 1): Pt will complete sit<>stand transfer with minA and LRAD PT Short Term Goal 3 (Week 1): Pt will complete bed<>chair transfers with minA and LRAD PT Short Term Goal 4 (Week 1): Pt will initiate pre-gait training  Skilled Therapeutic Interventions/Progress Updates:    Pt supine in bed to start session and agreeable to PT session. No reports of pain and already dressed. Supine<>sit completed with supervision with use of bed features (HOB at ~40deg). Sit's EOB unsupported with supervision. Donned L DARCO boot with totalA and R tennis shoe as well. Sit<>stand from raised EOB to RW with modA and cues for LLE NWB compliance. Attempted to complete stand<>pivot transfer with RW to w/c but pt unable to "pivot" on R foot with tennis shoe on. Returned to sitting and removed tennis shoe and completed stand<>pivot transfer with modA for powering to rise and minA for balance with RW while he "pivoted" on R foot. Pt propelled himself ~123ft with supervision in w/c from his room to main rehab gym. Wheeled in // bars to work on standing tolerance and pre-gait training. Completes sit<>stand in // bar with minA while pulling to stand and able to stand for 73min30sec with supervision in // bar while being compliant with LLE NWB status. Next, worked on static standing hopping where he completed 3x10 hops (seated rest break b/w sets) with minA for balance. Able to fully clear R foot 4/10 reps and otherwise would only lift heel with toe box on ground. He also completed 1x10 heel raises on L foot (limited ability to lift heel for full AROM). Pt completed the following seated  there-ex: -alternating LAQ with 4# ankle weight -alternating knee isometric extenions with 5 sec holds with 4# ankle weight -alternating LAQ + hip abduction with 4# ankle weight *3x20 each  Pt returned to his room in w/c and remained seated in w/c with safety belt alarm on and all needs met.   Therapy Documentation Precautions:  Precautions Precautions: Fall Precaution Comments: venous stasis, NWB LLE w/ DARCO Required Braces or Orthoses: Other Brace Other Brace: L darco shoe Restrictions Weight Bearing Restrictions: Yes LLE Weight Bearing: Non weight bearing Other Position/Activity Restrictions: Pt unable to come to full stance without breaking LLE NWB precautions. General:    Therapy/Group: Individual Therapy  Alger Simons 08/20/2020, 7:33 AM

## 2020-08-20 NOTE — Progress Notes (Signed)
Pt requested to hold Torsemide this morning due to not wanting to deal with the urgency to urinate throughout therapy sessions, pt refused Torsemide when offered later in afternoon due to not wanting to have the urgency to urinate throughout the night. Educated pt on risks due to not taking the medication, pt understood and requested to have the Torsemide administered closer to lunchtime in the middle of the day.  Milford Cage LPN

## 2020-08-21 DIAGNOSIS — L97519 Non-pressure chronic ulcer of other part of right foot with unspecified severity: Secondary | ICD-10-CM

## 2020-08-21 DIAGNOSIS — Z89432 Acquired absence of left foot: Secondary | ICD-10-CM | POA: Diagnosis not present

## 2020-08-21 LAB — GLUCOSE, CAPILLARY
Glucose-Capillary: 100 mg/dL — ABNORMAL HIGH (ref 70–99)
Glucose-Capillary: 101 mg/dL — ABNORMAL HIGH (ref 70–99)
Glucose-Capillary: 105 mg/dL — ABNORMAL HIGH (ref 70–99)
Glucose-Capillary: 107 mg/dL — ABNORMAL HIGH (ref 70–99)

## 2020-08-21 MED ORDER — MAGNESIUM SULFATE 2 GM/50ML IV SOLN
2.0000 g | Freq: Once | INTRAVENOUS | Status: AC
  Administered 2020-08-21: 2 g via INTRAVENOUS
  Filled 2020-08-21: qty 50

## 2020-08-21 MED ORDER — CARVEDILOL 6.25 MG PO TABS
6.2500 mg | ORAL_TABLET | Freq: Two times a day (BID) | ORAL | Status: DC
  Administered 2020-08-22 – 2020-08-25 (×7): 6.25 mg via ORAL
  Filled 2020-08-21 (×8): qty 1

## 2020-08-21 MED ORDER — TORSEMIDE 20 MG PO TABS
20.0000 mg | ORAL_TABLET | Freq: Every day | ORAL | Status: DC
  Administered 2020-08-21 – 2020-08-26 (×6): 20 mg via ORAL
  Filled 2020-08-21 (×5): qty 1

## 2020-08-21 NOTE — Progress Notes (Signed)
Occupational Therapy Session Note  Patient Details  Name: [name] MRN: [id number] Date of Birth: 1955/04/27  Today's Date: 08/21/2020 OT Individual Time: [id number] OT Individual Time Calculation (min): 60 min    Short Term Goals: Week 1:  OT Short Term Goal 1 (Week 1): Pt will complete BSC/toilet transfer with LRAD and good precaution adherence with mod A OT Short Term Goal 2 (Week 1): Pt will perform LB dress w/ AE PRN with Mod A OT Short Term Goal 3 (Week 1): Pt will perform bathing tasks with no more than Min A with AE PRN OT Short Term Goal 4 (Week 1): Pt will perform sit <> stands in prep for ADL with Min A  Skilled Therapeutic Interventions/Progress Updates:    Pt sitting up in w/c, requesting to wash up at sink.  Self propelled to sink via w/c and completed oral hgyiene/grooming with mod I. UB bathing with setup of washcloth and towel.  Doffed/donned shirt with setup.  Sit<>stand x 2 with min assist for power up at RW to doff/donn shorts.  Improved follow through of LLE NWB during sit<>stand transfers today.  Transported to ortho gym and completed UBE resistive exercise at level 3 to increase strength and endurance.  Pt needed one rest break at five minute mark of 2 minute duration due to fatigue and mild shortness of breath.  Completed resistive lat pulls downs BUE using 2# dowel and level 3 band 3 x 20 reps.  Returned to room, call bell in reach, seat alrm on.  Therapy Documentation Precautions:  Precautions Precautions: Fall Precaution Comments: venous stasis, NWB LLE w/ DARCO Required Braces or Orthoses: Other Brace Other Brace: L darco shoe Restrictions Weight Bearing Restrictions: Yes LLE Weight Bearing: Non weight bearing Other Position/Activity Restrictions: Pt unable to come to full stance without breaking LLE NWB precautions.   Therapy/Group: Individual Therapy  [name] 08/21/2020, 3:20 PM

## 2020-08-21 NOTE — Discharge Summary (Signed)
Physician Discharge Summary  Patient ID: CANDON DISCHLER MRN: [id number] DOB/AGE: October 12, 1955 65 y.o.  Admit date: 08/11/2020 Discharge date: 08/14/2020  Discharge Diagnoses:  Principal Problem:   S/P transmetatarsal amputation of foot, right ([place]) Active Problems:   Benign essential HTN   Debility   OSA (obstructive sleep apnea)   Diabetes mellitus, new onset ([place])   Permanent atrial fibrillation ([facility])   Toe ulcer, right ([place])   Discharged Condition: good  Significant Diagnostic Studies: N/A   CBG: Recent Labs  Lab 08/20/20 1208 08/20/20 1653 08/20/20 2117 08/21/20 0629 08/21/20 1130  GLUCAP 105* 108* 117* 100* 101*    Brief HPI:   [name] is a 65 y.o. male with history of follicular lymphoma, morbid obesity, recent ED visits of CHF exacerbation, left great toe amputation who was admitted on 08/04/2020 with acute osteomyelitis of left second and third toes with gangrenous changes of left second toe.  He was started on IV vancomycin and underwent left transmetatarsal amputation with [name] on 07/26.  Postop to be NWB with CAM boot and IV antibiotics transition to Augmentin/doxycycline x2 weeks duration.  PT OT consulted and patient was noted to have difficulty maintaining nonweightbearing status with standing attempts, fatigue as well as difficulty with mobility.  CIR was recommended due to functional decline.   Hospital Course: ELIER SEBASTIANI was admitted to rehab 08/11/2020 for inpatient therapies to consist of PT and OT at least three hours five days a week. Past admission physiatrist, therapy team and rehab RN have worked together to provide customized collaborative inpatient rehab.  Therapy evaluation revealed the patient required mod assist with basic self-care tasks and with mobility.  Ensure Max, Vitamin C and zinc were added to help promote wound healing.  Follow-up labs showed electrolytes to be within normal limits with low albumin at 2.6 and acute blood loss anemia with  H&H of 12.2 and 37.1.  White count was stable and platelets were within normal limits.    Blood pressures were monitored on TID basis and was stable.Diabetes has been monitored with ac/hs CBG checks and SSI was use prn for tighter BS control.  He was maintained on Levemir and sliding scale insulin and educated on carb modified diet. Right foot exam at admission showed ulceration of right second and third toe with stasis changes.  [name] was consulted for input on dressing changes and recommended daily dressing changes as well as vascular consult for follow-up on RLE.  Dr. Donzetta Matters was consulted for input and recommended angiogram with question of intervention for work-up.  He was discharged to acute hospital on 08/05 for procedure and monitoring.     Medications at discharge:  Allopurinol 300 mg p.o. daily 2.  Vitamin C 500 mg p.o. twice daily 3.  Augmentin 875 mg 1 p.o. 3 times daily 4.  Lipitor 40 mg p.o. daily 5.  Eliquis 5 mg p.o. twice daily 6.  Carvedilol 12.5 mg p.o. twice daily 7.  Doxycycline 100 mg p.o. twice daily 8.  Colace 100 mg p.o. twice daily 9.  Protonix 40 mg p.o. daily 10.  Multivitamin 1 daily 11.  Metformin to 50 mg p.o. twice daily 12 Ensure max 1 p.o. twice daily 13.  Zoloft 50 mg p.o. daily 14.  Zinc sulfate to 20 mg p.o. daily 15.  K-Dur 20 mEq p.o. daily 16. Demadex 40 mg p.o. daily  Diet: Carb Modified  Disposition: Acute hospital     Signed: Bary Leriche 08/21/2020, 4:01 PM

## 2020-08-21 NOTE — Progress Notes (Signed)
Physical Therapy Session Note  Patient Details  Name: [name] MRN: [id number] Date of Birth: 12-Jul-1955  Today's Date: 08/21/2020 PT Individual Time: 1400-1430 PT Individual Time Calculation (min): 30 min   Short Term Goals: Week 1:  PT Short Term Goal 1 (Week 1): Pt will complete bed mobility with supervision PT Short Term Goal 2 (Week 1): Pt will complete sit<>stand transfer with minA and LRAD PT Short Term Goal 3 (Week 1): Pt will complete bed<>chair transfers with minA and LRAD PT Short Term Goal 4 (Week 1): Pt will initiate pre-gait training  Skilled Therapeutic Interventions/Progress Updates:    Pt seated in w/c on arrival and agreeable to therapy. No complaint of pain. Pt with darco shoe donned at start of session. Session focused on w/c mobility 2/2 pt reporting that "everything" was sore from previous sessions. Pt propelled w/c with BUE x ~20 min with occ breaks. Navigated elevator, indoor and outdoor environment, varying surfaces, and elevation changes. Pt requested to play piano on first floor, maneuvered w/c to be in position. Played piano x ~5 min during long rest break, demoed improved mood after outdoor environment and piano. Pt returned to room, continuing to self propel. Pt left seated in w/c and was left with all needs in reach and alarm active.   Therapy Documentation Precautions:  Precautions Precautions: Fall Precaution Comments: venous stasis, NWB LLE w/ DARCO Required Braces or Orthoses: Other Brace Other Brace: L darco shoe Restrictions Weight Bearing Restrictions: Yes LLE Weight Bearing: Non weight bearing Other Position/Activity Restrictions: Pt unable to come to full stance without breaking LLE NWB precautions.    Therapy/Group: Individual Therapy  Mickel Fuchs 08/21/2020, 4:17 PM

## 2020-08-21 NOTE — Progress Notes (Signed)
Patient ID: [name], male   DOB: November 07, 1955, 65 y.o.   MRN: [id number] met with pt to discuss therapy team feels will be ready for discharge next Thursday and will need education with sister and friend. He will talk with over the weekned and let this worker know on Monday which day Tuesday or Wednesday works for them. See Monday to schedule education next week.

## 2020-08-21 NOTE — IPOC Note (Signed)
Overall Plan of Care [facility]) Patient Details Name: [name] MRN: [id number] DOB: 1956-01-07  Admitting Diagnosis: Status post transmetatarsal amputation of left foot [facility])  Hospital Problems: Principal Problem:   Status post transmetatarsal amputation of left foot ([place])     Functional Problem List: Nursing Bladder, Bowel, Endurance, Pain, Safety, Medication Management, Edema, Skin Integrity, Behavior  PT Balance, Endurance, Motor, Pain, Safety, Sensory, Skin Integrity  OT Balance, Safety, Edema, Skin Integrity, Endurance, Motor  SLP    TR         Basic ADL's: OT Grooming, Bathing, Dressing, Toileting     Advanced  ADL's: OT       Transfers: PT Bed Mobility, Bed to Chair, Teacher, early years/pre, Tub/Shower     Locomotion: PT Ambulation, Emergency planning/management officer, Stairs     Additional Impairments: OT    SLP        TR      Anticipated Outcomes Item Anticipated Outcome  Self Feeding    Swallowing      Basic self-care  mod I  Toileting  mod I   Bathroom Transfers mod I  Bowel/Bladder     Transfers  mod I with LRAD  Locomotion  Supervision short distance gait, mod I w/c  Communication     Cognition     Pain  at or below level 4  Safety/Judgment  maintain safety with cues/reminders   Therapy Plan: PT Intensity: Minimum of 1-2 x/day ,45 to 90 minutes PT Frequency: 5 out of 7 days PT Duration Estimated Length of Stay: 2-3 weeks OT Intensity: Minimum of 1-2 x/day, 45 to 90 minutes OT Frequency: 5 out of 7 days OT Duration/Estimated Length of Stay: 2-3 weeks     Due to the current state of emergency, patients may not be receiving their 3-hours of Medicare-mandated therapy.   Team Interventions: Nursing Interventions Disease Management/Prevention, Patient/Family Education, Pain Management, Skin Care/Wound Management, Medication Management, Discharge Planning  PT interventions Ambulation/gait training, Training and development officer, Community reintegration,  Discharge planning, Disease management/prevention, DME/adaptive equipment instruction, Functional mobility training, Neuromuscular re-education, Pain management, Patient/family education, Psychosocial support, Stair training, Therapeutic Activities, Therapeutic Exercise, UE/LE Strength taining/ROM, UE/LE Coordination activities, Wheelchair propulsion/positioning, Cognitive remediation/compensation, Functional electrical stimulation, Skin care/wound management, Splinting/orthotics, Visual/perceptual remediation/compensation  OT Interventions Balance/vestibular training, Discharge planning, Pain management, Self Care/advanced ADL retraining, Therapeutic Activities, UE/LE Coordination activities, Visual/perceptual remediation/compensation, Therapeutic Exercise, Skin care/wound managment, Patient/family education, Functional mobility training, Disease mangement/prevention, Cognitive remediation/compensation, Community reintegration, Engineer, drilling, Neuromuscular re-education, Psychosocial support, UE/LE Strength taining/ROM, Wheelchair propulsion/positioning, Splinting/orthotics  SLP Interventions    TR Interventions    SW/CM Interventions Discharge Planning, Psychosocial Support, Patient/Family Education   Barriers to Discharge MD  Medical stability  Nursing Decreased caregiver support, Home environment access/layout, New diabetic, Wound Care, Weight bearing restrictions, Medication compliance, Behavior    PT Decreased caregiver support, Home environment access/layout, Wound Care, Lack of/limited family support, Insurance for SNF coverage, Weight, Weight bearing restrictions NWB LLE, stage 2 wound on bottom  OT      SLP      SW Insurance for SNF coverage, Weight bearing restrictions, Wound Care     Team Discharge Planning: Destination: PT-Home ,OT- Home , SLP-  Projected Follow-up: PT-Home health PT, OT-  Home health OT, SLP-  Projected Equipment Needs: PT-To be determined, OT-  To be determined, SLP-  Equipment Details: PT-bariatric RW, w/c, OT-  Patient/family involved in discharge planning: PT- Patient,  OT-Patient, SLP-   MD ELOS: 2-3 weeks [place]  Prognosis:  Excellent Assessment: [name] is a 65 year old man admitted to CIR s/p transmetatarsal amputation of his left foot. Course was complicated by peripheral arterial disease requiring angiogram, bradycardia requiring decrease in Coreg dose, and hypomagnesemia requiring IV magnesium supplementation.   See Team Conference Notes for weekly updates to the plan of care

## 2020-08-21 NOTE — Progress Notes (Signed)
Physical Therapy Session Note  Patient Details  Name: [name] MRN: [id number] Date of Birth: Jan 18, 1955  Today's Date: 08/21/2020 PT Individual Time: 0900-1000 PT Individual Time Calculation (min): 60 min   Short Term Goals: Week 1:  PT Short Term Goal 1 (Week 1): Pt will complete bed mobility with supervision PT Short Term Goal 2 (Week 1): Pt will complete sit<>stand transfer with minA and LRAD PT Short Term Goal 3 (Week 1): Pt will complete bed<>chair transfers with minA and LRAD PT Short Term Goal 4 (Week 1): Pt will initiate pre-gait training  Skilled Therapeutic Interventions/Progress Updates:     Pt sitting in recliner to start session - awake and agreeable to therapy. No reports of pain. Wheeled with totalA for time to main rehab gym. // bars in use so worked on Psychologist, forensic at [employer]. Stand<>pivot transfer with modA for powering to rise and minA for balance with cues throughout for NWB compliance for LLE (tends to push through L heel to power to rise). Completed seated there-ex: -2x20 alternating LAQ with 4# ankle weight -2x20 alternating knee isometrics with 4#ankle weight with 5 sec holds -2x20 hip abd vs hip add with ball squeeze and gait belt around distal femur -1x15 shldr press with 12 lb dowel rod -1x15 chest press with 12 lb dowel rod -1x10 overhead tricep ext with 8lb dowl rod -2x10 tricep press-ups with push-up blocks (used RLE to assist with clearing buttock *Discussed DC planning during rest breaks and problem solving home management.  Stand<>pivot transfer back to his w/c with minA assist from raised mat table and RW, again requiring cues for LLE NWB compliance as he tends to push through L heel during pivot transfer. Returned back to his room for time and remained seated in w/c with safety belt alarm on and all needs in reach.  Therapy Documentation Precautions:  Precautions Precautions: Fall Precaution Comments: venous stasis, NWB LLE w/  DARCO Required Braces or Orthoses: Other Brace Other Brace: L darco shoe Restrictions Weight Bearing Restrictions: Yes LLE Weight Bearing: Non weight bearing Other Position/Activity Restrictions: Pt unable to come to full stance without breaking LLE NWB precautions. General:    Therapy/Group: Individual Therapy  Alger Simons 08/21/2020, 7:33 AM

## 2020-08-21 NOTE — Progress Notes (Signed)
PROGRESS NOTE   Subjective/Complaints: +urinary frequency with Toresemide- changed order to daily with lunch as per his request  ROS: denies pain, headache, +urinary frequency with torsemide   Objective:   No results found. Recent Labs    08/19/20 0455  WBC 9.7  HGB 13.0  HCT 39.7  PLT 280   Recent Labs    08/19/20 0455  NA 136  K 4.4  CL 100  CO2 28  GLUCOSE 110*  BUN 17  CREATININE 0.91  CALCIUM 9.2    Intake/Output Summary (Last 24 hours) at 08/21/2020 [id number] Last data filed at 08/21/2020 0911 Gross per 24 hour  Intake 440 ml  Output 1400 ml  Net -960 ml     Pressure Injury 08/14/20 Buttocks Left;Medial Stage 2 -  Partial thickness loss of dermis presenting as a shallow open injury with a red, pink wound bed without slough. 1.4 by 0.4 (Active)  08/14/20 0210  Location: Buttocks  Location Orientation: Left;Medial  Staging: Stage 2 -  Partial thickness loss of dermis presenting as a shallow open injury with a red, pink wound bed without slough.  Wound Description (Comments): 1.4 by 0.4  Present on Admission: -- (unknown)    Physical Exam: Vital Signs Blood pressure (!) 143/74, pulse (!) 58, temperature 98 F (36.7 C), resp. rate 18, height '5\' 11"'$  (1.803 m), weight (!) 144.1 kg, SpO2 95 %. Gen: no distress, normal appearing HEENT: oral mucosa pink and moist, NCAT Cardio: Bradycardia Chest: normal effort, normal rate of breathing  Ext: bilateral LE venous stasis, peripheral edema Psych: pleasant, normal affect Skin: See MSK Neuro: Alert and oriented x4 Musculoskeletal: Left foot with dressings in place   Assessment/Plan: 1. Functional deficits which require 3+ hours per day of interdisciplinary therapy in a comprehensive inpatient rehab setting. Physiatrist is providing close team supervision and 24 hour management of active medical problems listed below. Physiatrist and rehab team continue to  assess barriers to discharge/monitor patient progress toward functional and medical goals  Care Tool:  Bathing    Body parts bathed by patient: Right arm, Left arm, Chest, Abdomen, Front perineal area, Right upper leg, Left upper leg, Face, Buttocks   Body parts bathed by helper: Right lower leg Body parts n/a: Left lower leg   Bathing assist Assist Level: Moderate Assistance - Patient 50 - 74%     Upper Body Dressing/Undressing Upper body dressing   What is the patient wearing?: Pull over shirt    Upper body assist Assist Level: Set up assist    Lower Body Dressing/Undressing Lower body dressing      What is the patient wearing?: Pants     Lower body assist Assist for lower body dressing: Maximal Assistance - Patient 25 - 49%     Toileting Toileting    Toileting assist Assist for toileting: Independent with assistive device Assistive Device Comment: urinal   Transfers Chair/bed transfer  Transfers assist     Chair/bed transfer assist level: Moderate Assistance - Patient 50 - 74% Chair/bed transfer assistive device: Programmer, multimedia   Ambulation assist   Ambulation activity did not occur: Safety/medical concerns (unable to comply with NWB precautions)  Walk 10 feet activity   Assist  Walk 10 feet activity did not occur: Safety/medical concerns        Walk 50 feet activity   Assist Walk 50 feet with 2 turns activity did not occur: Safety/medical concerns         Walk 150 feet activity   Assist Walk 150 feet activity did not occur: Safety/medical concerns         Walk 10 feet on uneven surface  activity   Assist Walk 10 feet on uneven surfaces activity did not occur: Safety/medical concerns         Wheelchair     Assist Will patient use wheelchair at discharge?: Yes Type of Wheelchair: Manual    Wheelchair assist level: Supervision/Verbal cueing Max wheelchair distance: 15f    Wheelchair  50 feet with 2 turns activity    Assist        Assist Level: Supervision/Verbal cueing   Wheelchair 150 feet activity     Assist      Assist Level: Minimal Assistance - Patient > 75%   Blood pressure (!) 143/74, pulse (!) 58, temperature 98 F (36.7 C), resp. rate 18, height '5\' 11"'$  (1.803 m), weight (!) 144.1 kg, SpO2 95 %.  Medical Problem List and Plan: 1.  S/p transmetatarsal amputation of left foot             -patient may shower but incision must be covered             -ELOS/Goals: 2-3 weeks modI             -Continue CIR 2.  Antithrombotics: -DVT/anticoagulation:  Pharmaceutical: Other (comment)--eliquis             -antiplatelet therapy: N/A 3. Postoperative pain: Decrease hydrocodone to 1 tab q8H PRN.  4. Mood: LCSW to follow for evaluation and support.             -antipsychotic agents: N/A 5. Neuropsych: This patient is capable of making decisions on his own behalf. 6. Skin/Wound Care: Daily dressing changes to left foot. Pressure relief measures             --will order nystatin powder for candidal rash inguinal areas. 7. Fluids/Electrolytes/Nutrition: Monitor I/O. Check lytes in am. 8. Pre-diabetes: Hgb A1c-6.4 and discussed treating as new onset DM             --monitor BS ac/ch. CM diet  -recommended against Ensure due to its added sugar- discussed alternative foods that are good for wound healing 9.  Chronic systolic CHF: Compensated. Will monitor weights daily. Low salt diet. --continue Demadex, Lipitor, Coreg and Losartan. 10. Hypoalbuminemia: Will add juven bid to promote wound healing. 11. Gout flare: Started on colchicine on 08/09 12.  Osteomyelitis left toes s/p amputation: To continue Augmentin/doxycycline for 2 weeks--end date 08/14. Vitamin D level ordered.  13. Hyponatremia: Stable. Recheck on Monday 14. A fib: Monitor HR tid--continue coreg and eliquis. Magnesium level is 1.8. Supplement 2gm IV on 8/12 and repeat Magnesium level on  Monday 15. Peripheral arterial disease: continue nitrodur 0.'1mg'$  transdermal patch on left tibia. Discussed possible side effects to look out for- he did not experience any.  16. Urinary frequency with Torsemide: changed to with lunch as per his request.  17. Bradycardia: decrease Coreg dose. Discussed with patient.     LOS: 3 days A FACE TO FACE EVALUATION WAS PERFORMED  KIzora Ribas8/12/2020, 9:38 AM

## 2020-08-21 NOTE — Progress Notes (Signed)
Physical Therapy Session Note  Patient Details  Name: [name] MRN: [id number] Date of Birth: 09-Nov-1955  Today's Date: 08/21/2020 PT Individual Time: 0800-0830 PT Individual Time Calculation (min): 30 min   Short Term Goals: Week 1:  PT Short Term Goal 1 (Week 1): Pt will complete bed mobility with supervision PT Short Term Goal 2 (Week 1): Pt will complete sit<>stand transfer with minA and LRAD PT Short Term Goal 3 (Week 1): Pt will complete bed<>chair transfers with minA and LRAD PT Short Term Goal 4 (Week 1): Pt will initiate pre-gait training Week 2:    Week 3:     Skilled Therapeutic Interventions/Progress Updates:  Pain:  Pt reports no pain.  Treatment to tolerance.  Rest breaks and repositioning as needed.  Pt initially supine and agreeable to treatment session w/focus on transfer training, standing tolerance/endurance.  Supine to sit w/HOB elevated and use of rails w/supervision.  Total assist to don Darco shoe. Sit to stand from elevated bed w/cga NWB on LLE, pivots arduously but w/CGA w/RW to wc w/cga. Transported to gym Sit to stand in parallel bars for standing tolerance/global strength. Stands 2 min x 1, 2.5 min x 1 w/cga and bilat UE support, maintains NWB well at all times. Transported back to room at end of session. Pt left oob in wc w/alarm belt set and needs in reach Therapy Documentation Precautions:  Precautions Precautions: Fall Precaution Comments: venous stasis, NWB LLE w/ [facility] Required Braces or Orthoses: Other Brace Other Brace: L darco shoe Restrictions Weight Bearing Restrictions: Yes LLE Weight Bearing: Non weight bearing Other Position/Activity Restrictions: Pt unable to come to full stance without breaking LLE NWB precautions.    Therapy/Group: Individual Therapy Callie Fielding, [place] 08/21/2020, 12:33 PM

## 2020-08-21 NOTE — Progress Notes (Signed)
Physical Therapy Discharge Summary  Patient Details  Name: [name] MRN: [id number] Date of Birth: [birth year]  Patient has met 0 of 9 long term goals due to early d/c.    Reasons goals not met: Pt transferred to acute for surgical intervention, did not complete rehab POC.  Recommendation:  Patient will benefit from ongoing skilled PT services to continue to advance safe functional mobility, address ongoing impairments in balance, strength, edema, functional mobility, and minimize fall risk.  Equipment: No equipment provided  Reasons for discharge: change in medical status  Patient/family agrees with progress made and goals achieved: Yes  PT Discharge     Mickel Fuchs 08/21/2020, 6:26 PM

## 2020-08-22 LAB — GLUCOSE, CAPILLARY
Glucose-Capillary: 105 mg/dL — ABNORMAL HIGH (ref 70–99)
Glucose-Capillary: 92 mg/dL (ref 70–99)
Glucose-Capillary: 86 mg/dL (ref 70–99)
Glucose-Capillary: 110 mg/dL — ABNORMAL HIGH (ref 70–99)

## 2020-08-22 NOTE — Progress Notes (Signed)
Pt states CPAP pressure felt a little low. Current setting 5cmH20. Offered to adjust setting for comfort, but pt states he thinks 5cmH20 is what he wears at home. Advised pt to notify for RT if he feels pressure still isnt adequate. RT will continue to monitor.

## 2020-08-22 NOTE — Progress Notes (Signed)
Physical Therapy Session Note  Patient Details  Name: [name] MRN: [id number] Date of Birth: 06/28/1955  Today's Date: 08/22/2020 PT Individual Time: [id number] PT Individual Time Calculation (min): 30 min   Short Term Goals: Week 1:  PT Short Term Goal 1 (Week 1): Pt will complete bed mobility with supervision PT Short Term Goal 2 (Week 1): Pt will complete sit<>stand transfer with minA and LRAD PT Short Term Goal 3 (Week 1): Pt will complete bed<>chair transfers with minA and LRAD PT Short Term Goal 4 (Week 1): Pt will initiate pre-gait training  Skilled Therapeutic Interventions/Progress Updates:    pt received in bed and agreeable to therapy. No complaint of pain. Donned darco shoe with assist in supine. Supine> sit with supervision and use of bed features. Stand pivot transfer bed>w/c<>mat table with min A for balance while performing transfer. Discussed home set up and how he will transfer in his own space. Pt performed 2 x 5 Sit to stand with CGA, demoed fatigue with activity. Pt propelled w/c with BUE to/from gym with supervision. Pt returned to room and was left with all needs in reach and alarm active.   Therapy Documentation Precautions:  Precautions Precautions: Fall Precaution Comments: venous stasis, NWB LLE w/ DARCO Required Braces or Orthoses: Other Brace Other Brace: L darco shoe Restrictions Weight Bearing Restrictions: Yes LLE Weight Bearing: Non weight bearing Other Position/Activity Restrictions: Pt unable to come to full stance without breaking LLE NWB precautions.   Therapy/Group: Individual Therapy  Mickel Fuchs 08/22/2020, 12:24 PM

## 2020-08-23 DIAGNOSIS — I5022 Chronic systolic (congestive) heart failure: Secondary | ICD-10-CM | POA: Diagnosis not present

## 2020-08-23 DIAGNOSIS — E871 Hypo-osmolality and hyponatremia: Secondary | ICD-10-CM | POA: Diagnosis not present

## 2020-08-23 DIAGNOSIS — R0989 Other specified symptoms and signs involving the circulatory and respiratory systems: Secondary | ICD-10-CM

## 2020-08-23 DIAGNOSIS — R7303 Prediabetes: Secondary | ICD-10-CM

## 2020-08-23 DIAGNOSIS — G8918 Other acute postprocedural pain: Secondary | ICD-10-CM

## 2020-08-23 DIAGNOSIS — Z89432 Acquired absence of left foot: Secondary | ICD-10-CM | POA: Diagnosis not present

## 2020-08-23 DIAGNOSIS — I10 Essential (primary) hypertension: Secondary | ICD-10-CM

## 2020-08-23 LAB — GLUCOSE, CAPILLARY
Glucose-Capillary: 108 mg/dL — ABNORMAL HIGH (ref 70–99)
Glucose-Capillary: 98 mg/dL (ref 70–99)
Glucose-Capillary: 92 mg/dL (ref 70–99)
Glucose-Capillary: 121 mg/dL — ABNORMAL HIGH (ref 70–99)

## 2020-08-23 MED ORDER — GERHARDT'S BUTT CREAM
TOPICAL_CREAM | Freq: Two times a day (BID) | CUTANEOUS | Status: DC
  Filled 2020-08-23: qty 1

## 2020-08-23 MED ORDER — CALCIUM POLYCARBOPHIL 625 MG PO TABS
625.0000 mg | ORAL_TABLET | Freq: Every day | ORAL | Status: DC
  Administered 2020-08-23 – 2020-08-27 (×5): 625 mg via ORAL
  Filled 2020-08-23 (×5): qty 1

## 2020-08-23 NOTE — Progress Notes (Signed)
Physical Therapy Session Note  Patient Details  Name: [name] MRN: [id number] Date of Birth: Mar 07, 1955  Today's Date: 08/23/2020 PT Individual Time: 1000-1100; 1500-1545 PT Individual Time Calculation (min): 60 min and 45 min  Short Term Goals: Week 1:  PT Short Term Goal 1 (Week 1): Pt will complete bed mobility with supervision PT Short Term Goal 2 (Week 1): Pt will complete sit<>stand transfer with minA and LRAD PT Short Term Goal 3 (Week 1): Pt will complete bed<>chair transfers with minA and LRAD PT Short Term Goal 4 (Week 1): Pt will initiate pre-gait training  Skilled Therapeutic Interventions/Progress Updates:    Session 1: Pt received seated in w/c in room, agreeable to PT session. No complaints of pain. Manual w/c propulsion to/from therapy gym at Supervision level with use of BUE. Sit to stand in // bars with CGA to min A. Reviewed technique for "hopping" on RLE with use of shoulder depression. Pt able to perform hops 3 x 5 reps to fatigue, exhibits improved LE clearance with cues for technique. Pt reports onset of UE fatigue following hopping. Seated BLE strengthening therex: marches, LAQ, HS curls with OTB, hip abd with OTB, ankle pumps, hip add squeeze x 15-20 reps each. Sit to stand x 5 reps from w/c to RW with min A, increased time needed to complete as reps progress due to fatigue. Pt left seated in w/c in room with needs in reach, quick release belt in place at end of session.  Session 2: Pt received seated in w/c in room, agreeable to PT session. Pt reports some soreness in his hips from working them in earlier session, not rated. Sit to stand with min A in // bars. Standing LLE therex 2 x 15-20 reps: marches, hip abd, HS curls; RLE heel raises 2 x 10 reps, squats x 10 reps to fatigue. Seated core strengthening with 2 kg weighted ball: punch-outs, OH lift, R/L diagonals 2 x 10-15 reps each. Pt requests to return to bed at end of session. Stand pivot transfer w/c to bed  with CGA and RW. Sit to supine Supervision. Pt left seated in bed with needs in reach, bed alarm in place, family present.  Therapy Documentation Precautions:  Precautions Precautions: Fall Precaution Comments: venous stasis, NWB LLE w/ DARCO Required Braces or Orthoses: Other Brace Other Brace: L darco shoe Restrictions Weight Bearing Restrictions: Yes LLE Weight Bearing: Non weight bearing Other Position/Activity Restrictions: Pt unable to come to full stance without breaking LLE NWB precautions.    Therapy/Group: Individual Therapy   Excell Seltzer, PT, DPT, CSRS  08/23/2020, 12:18 PM

## 2020-08-23 NOTE — Progress Notes (Signed)
Occupational Therapy Session Note  Patient Details  Name: [name] MRN: [id number] Date of Birth: [birth year]  Today's Date: 08/23/2020 OT Individual Time: [id number] and 1300-1340 OT Individual Time Calculation (min): 56 min and 40 min   Short Term Goals: Week 1:  OT Short Term Goal 1 (Week 1): Pt will complete BSC/toilet transfer with LRAD and good precaution adherence with mod A OT Short Term Goal 2 (Week 1): Pt will perform LB dress w/ AE PRN with Mod A OT Short Term Goal 3 (Week 1): Pt will perform bathing tasks with no more than Min A with AE PRN OT Short Term Goal 4 (Week 1): Pt will perform sit <> stands in prep for ADL with Min A   Skilled Therapeutic Interventions/Progress Updates:    Session 1: Pt greeted at time of session semireclined in bed resting agreeable to OT session and no pain. Bed mobility Supervision to sit EOB and originally donned shoe with Supervision but assisted with donning sock instead as pt cannot "hop" well on RLE. Donned DARCO on LLE for the pt for time. Stand pivot bed > wheelchair CGA/Min with RW and self propel to sink for oral hygiene and face washing. OT also attempted to find bariatric heavy duty RW for pt but unable at this time. Self propel room <> gym with Supervision and extended time, stand pivot > mat Min A as well. Focused on core strength and global endurance training with rebounder throws and 2kg ball for tosses, toss + overhead and  toss + torso twist for 1x15 each. 3x10 pushups on push up blocks for tricep and shoulder strengthening with varying heights of mat table for challenge. Cues throughout session to not "plop" into chair and control descent. Stand pivot mat > chair same as above and set up in room alarm on call bell in reach.    Session 2: Pt greeted at time of session sitting up in chair no pain, agreeable to OT session. Pt wanting to go outside, cleared with RN prior. Pt self propel room <> elevator <> atrium and outside to sitting  area avoiding obstacles and maneuvering wheelchair with BUE propulsion for global cardiorespiratory endurance. Discussion throughout regarding DC, DME needs, assist at home, etc. 1 rest break needed each trip. Upon returning to room, sister present and left pt up in chair with alarm on call bell in reach.    Therapy Documentation Precautions:  Precautions Precautions: Fall Precaution Comments: venous stasis, NWB LLE w/ DARCO Required Braces or Orthoses: Other Brace Other Brace: L darco shoe Restrictions Weight Bearing Restrictions: Yes LLE Weight Bearing: Non weight bearing Other Position/Activity Restrictions: Pt unable to come to full stance without breaking LLE NWB precautions.     Therapy/Group: Individual Therapy  Viona Gilmore 08/23/2020, 7:15 AM

## 2020-08-23 NOTE — Progress Notes (Addendum)
PROGRESS NOTE   Subjective/Complaints: Patient seen sitting up in bed this morning.  He states he did not sleep well overnight due to diarrhea.  ROS: + Diarrhea.  Denies CP, shortness of breath, nausea, vomiting.   Objective:   No results found. No results for input(s): WBC, HGB, HCT, PLT in the last 72 hours.  No results for input(s): NA, K, CL, CO2, GLUCOSE, BUN, CREATININE, CALCIUM in the last 72 hours.   Intake/Output Summary (Last 24 hours) at 08/23/2020 0859 Last data filed at 08/22/2020 2030 Gross per 24 hour  Intake 577 ml  Output --  Net 577 ml      Pressure Injury 08/14/20 Buttocks Left;Medial Stage 2 -  Partial thickness loss of dermis presenting as a shallow open injury with a red, pink wound bed without slough. 1.4 by 0.4 (Active)  08/14/20 0210  Location: Buttocks  Location Orientation: Left;Medial  Staging: Stage 2 -  Partial thickness loss of dermis presenting as a shallow open injury with a red, pink wound bed without slough.  Wound Description (Comments): 1.4 by 0.4  Present on Admission: -- (unknown)    Physical Exam: Vital Signs Blood pressure (!) 151/75, pulse (!) 55, temperature 97.7 F (36.5 C), temperature source Oral, resp. rate 18, height '5\' 11"'$  (1.803 m), weight (!) 143.6 kg, SpO2 95 %. Constitutional: No distress . Vital signs reviewed. HENT: Normocephalic.  Atraumatic. Eyes: EOMI. No discharge. Cardiovascular: No JVD.  RRR. Respiratory: Normal effort.  No stridor.  Bilateral clear to auscultation. GI: Non-distended.  BS +. Skin: Warm and dry.  Left foot with dressing CDI. Psych: Normal mood.  Normal behavior. Musc: Lower extreme edema.  No tenderness in extremities. Neuro: Alert and oriented Motor: Grossly intact, some limitations due to pain and dressing.  Assessment/Plan: 1. Functional deficits which require 3+ hours per day of interdisciplinary therapy in a comprehensive  inpatient rehab setting. Physiatrist is providing close team supervision and 24 hour management of active medical problems listed below. Physiatrist and rehab team continue to assess barriers to discharge/monitor patient progress toward functional and medical goals  Care Tool:  Bathing    Body parts bathed by patient: Right arm, Chest, Abdomen, Left arm, Face   Body parts bathed by helper: Right lower leg Body parts n/a: Left lower leg   Bathing assist Assist Level: Set up assist     Upper Body Dressing/Undressing Upper body dressing   What is the patient wearing?: Pull over shirt    Upper body assist Assist Level: Set up assist    Lower Body Dressing/Undressing Lower body dressing      What is the patient wearing?: Pants     Lower body assist Assist for lower body dressing: Minimal Assistance - Patient > 75%     Toileting Toileting    Toileting assist Assist for toileting: Independent with assistive device Assistive Device Comment: urinal   Transfers Chair/bed transfer  Transfers assist     Chair/bed transfer assist level: Moderate Assistance - Patient 50 - 74% Chair/bed transfer assistive device: Programmer, multimedia   Ambulation assist   Ambulation activity did not occur: Safety/medical concerns (unable to comply with NWB precautions)  Walk 10 feet activity   Assist  Walk 10 feet activity did not occur: Safety/medical concerns        Walk 50 feet activity   Assist Walk 50 feet with 2 turns activity did not occur: Safety/medical concerns         Walk 150 feet activity   Assist Walk 150 feet activity did not occur: Safety/medical concerns         Walk 10 feet on uneven surface  activity   Assist Walk 10 feet on uneven surfaces activity did not occur: Safety/medical concerns         Wheelchair     Assist Will patient use wheelchair at discharge?: Yes Type of Wheelchair: Manual    Wheelchair  assist level: Supervision/Verbal cueing Max wheelchair distance: 130f    Wheelchair 50 feet with 2 turns activity    Assist        Assist Level: Supervision/Verbal cueing   Wheelchair 150 feet activity     Assist      Assist Level: Minimal Assistance - Patient > 75%   Blood pressure (!) 151/75, pulse (!) 55, temperature 97.7 F (36.5 C), temperature source Oral, resp. rate 18, height '5\' 11"'$  (1.803 m), weight (!) 143.6 kg, SpO2 95 %.  Medical Problem List and Plan: 1.  S/p transmetatarsal amputation of left foot Continue CIR 2.  Antithrombotics: -DVT/anticoagulation:  Pharmaceutical: Other (comment)--eliquis             -antiplatelet therapy: N/A 3. Postoperative pain: Decrease hydrocodone to 1 tab q8H PRN.   Controlled on 8/14 4. Mood: LCSW to follow for evaluation and support.             -antipsychotic agents: N/A 5. Neuropsych: This patient is capable of making decisions on his own behalf. 6. Skin/Wound Care: Daily dressing changes to left foot. Pressure relief measures             --will order nystatin powder for candidal rash inguinal areas.  Gerhardt ordered 7. Fluids/Electrolytes/Nutrition: Monitor I/Os. 8. Pre-diabetes: Hgb A1c-6.4 and discussed treating as new onset DM             --monitor BS ac/ch. CM diet  -recommended against Ensure due to its added sugar- discussed alternative foods that are good for wound healing  Mildly elevated on 8/14 9.  Chronic systolic CHF/HTN: Compensated. Will monitor weights daily. Low salt diet. --continue Demadex, Lipitor, Coreg and Losartan. Blood pressure is labile on 8/14, monitor for trend Filed Weights   08/21/20 0544 08/22/20 0533 08/23/20 0458  Weight: (!) 144.1 kg (!) 146.5 kg (!) 143.6 kg  10. Hypoalbuminemia: Added juven bid to promote wound healing. 11. Gout flare: Started on colchicine on 08/09 12.  Osteomyelitis left toes s/p amputation: To continue Augmentin/doxycycline for 2 weeks--end date 08/14.  Vitamin D level ordered.  13. Hyponatremia: Stable. Recheck tomorrow 14. A fib: Monitor HR tid--continue coreg and eliquis. Magnesium level is 1.8. Supplement 2gm IV on 8/12 and repeat Magnesium level tomorrow 15. Peripheral arterial disease: continue nitrodur 0.'1mg'$  transdermal patch on left tibia. Discussed possible side effects to look out for- he did not experience any.  16. Urinary frequency with Torsemide: changed to with lunch as per his request.  17. Bradycardia: decrease Coreg dose. Discussed with patient.  18.  Diarrhea  Likely secondary to antibiotics  FiberCon started   LOS: 5 days A FACE TO FACE EVALUATION WAS PERFORMED  Talal Fritchman ALorie Phenix8/14/2022, 8:59 AM

## 2020-08-24 ENCOUNTER — Ambulatory Visit: Payer: Medicare Other | Admitting: General Practice

## 2020-08-24 DIAGNOSIS — Z89432 Acquired absence of left foot: Secondary | ICD-10-CM | POA: Diagnosis not present

## 2020-08-24 LAB — BASIC METABOLIC PANEL
Anion gap: 8 (ref 5–15)
BUN: 21 mg/dL (ref 8–23)
CO2: 28 mmol/L (ref 22–32)
Calcium: 9.5 mg/dL (ref 8.9–10.3)
Chloride: 102 mmol/L (ref 98–111)
Creatinine, Ser: 1.05 mg/dL (ref 0.61–1.24)
GFR, Estimated: >60 mL/min (ref >60)
Glucose, Bld: 141 mg/dL — ABNORMAL HIGH (ref 70–99)
Potassium: 3.8 mmol/L (ref 3.5–5.1)
Sodium: 138 mmol/L (ref 135–145)

## 2020-08-24 LAB — CBC
HCT: 43.6 % (ref 39.0–52.0)
Hemoglobin: 13.8 g/dL (ref 13.0–17.0)
MCH: 30.1 pg (ref 26.0–34.0)
MCHC: 31.7 g/dL (ref 30.0–36.0)
MCV: 95.2 fL (ref 80.0–100.0)
Platelets: 291 10*3/uL (ref 150–400)
RBC: 4.58 MIL/uL (ref 4.22–5.81)
RDW: 14.5 % (ref 11.5–15.5)
WBC: 6.4 10*3/uL (ref 4.0–10.5)
nRBC: 0 % (ref 0.0–0.2)

## 2020-08-24 LAB — GLUCOSE, CAPILLARY
Glucose-Capillary: 112 mg/dL — ABNORMAL HIGH (ref 70–99)
Glucose-Capillary: 92 mg/dL (ref 70–99)
Glucose-Capillary: 106 mg/dL — ABNORMAL HIGH (ref 70–99)

## 2020-08-24 LAB — MAGNESIUM: Magnesium: 1.8 mg/dL (ref 1.7–2.4)

## 2020-08-24 MED ORDER — HYDROCODONE-ACETAMINOPHEN 10-325 MG PO TABS
1.0000 | ORAL_TABLET | Freq: Two times a day (BID) | ORAL | Status: DC | PRN
Start: 2020-08-24 — End: 2020-08-25

## 2020-08-24 NOTE — Progress Notes (Signed)
RT note: Patient refused to wear CPAP tonight. Vital stable at this time.

## 2020-08-24 NOTE — Progress Notes (Signed)
Patient ID: [name], male   DOB: 07/23/1955, 65 y.o.   MRN: [id number] Met with pot to check to see when sister and friend are coming in for education. He reports they can come in tomorrow at 9-11. 

## 2020-08-24 NOTE — Progress Notes (Signed)
PROGRESS NOTE   Subjective/Complaints: No complaints this morning.  ROS:  Denies CP, shortness of breath, nausea, vomiting.   Objective:   No results found. Recent Labs    08/24/20 0745  WBC 6.4  HGB 13.8  HCT 43.6  PLT 291   Recent Labs    08/24/20 0745  NA 138  K 3.8  CL 102  CO2 28  GLUCOSE 141*  BUN 21  CREATININE 1.05  CALCIUM 9.5    Intake/Output Summary (Last 24 hours) at 08/24/2020 1855 Last data filed at 08/24/2020 1200 Gross per 24 hour  Intake 413 ml  Output 2200 ml  Net -1787 ml     Pressure Injury 08/14/20 Buttocks Left;Medial Stage 2 -  Partial thickness loss of dermis presenting as a shallow open injury with a red, pink wound bed without slough. 1.4 by 0.4 (Active)  08/14/20 0210  Location: Buttocks  Location Orientation: Left;Medial  Staging: Stage 2 -  Partial thickness loss of dermis presenting as a shallow open injury with a red, pink wound bed without slough.  Wound Description (Comments): 1.4 by 0.4  Present on Admission: -- (unknown)    Physical Exam: Vital Signs Blood pressure 107/76, pulse 69, temperature 97.9 F (36.6 C), temperature source Oral, resp. rate 17, height '5\' 11"'$  (1.803 m), weight (!) 143.6 kg, SpO2 100 %. Gen: no distress, normal appearing HEENT: oral mucosa pink and moist, NCAT Cardio: Reg rate Chest: normal effort, normal rate of breathing Abd: soft, non-distended Ext: no edema Psych: pleasant, normal affect Skin: Warm and dry.  Left foot with dressing CDI. Psych: Normal mood.  Normal behavior. Musc: Lower extreme edema.  No tenderness in extremities. Neuro: Alert and oriented Motor: Grossly intact, some limitations due to pain and dressing.  Assessment/Plan: 1. Functional deficits which require 3+ hours per day of interdisciplinary therapy in a comprehensive inpatient rehab setting. Physiatrist is providing close team supervision and 24 hour management  of active medical problems listed below. Physiatrist and rehab team continue to assess barriers to discharge/monitor patient progress toward functional and medical goals  Care Tool:  Bathing    Body parts bathed by patient: Right arm, Chest, Abdomen, Left arm, Face   Body parts bathed by helper: Right lower leg Body parts n/a: Left lower leg   Bathing assist Assist Level: Set up assist     Upper Body Dressing/Undressing Upper body dressing   What is the patient wearing?: Pull over shirt    Upper body assist Assist Level: Set up assist    Lower Body Dressing/Undressing Lower body dressing      What is the patient wearing?: Pants     Lower body assist Assist for lower body dressing: Minimal Assistance - Patient > 75%     Toileting Toileting    Toileting assist Assist for toileting: Independent with assistive device Assistive Device Comment: urinal   Transfers Chair/bed transfer  Transfers assist     Chair/bed transfer assist level: Moderate Assistance - Patient 50 - 74% Chair/bed transfer assistive device: Programmer, multimedia   Ambulation assist   Ambulation activity did not occur: Safety/medical concerns (unable to comply with NWB precautions)  Walk 10 feet activity   Assist  Walk 10 feet activity did not occur: Safety/medical concerns        Walk 50 feet activity   Assist Walk 50 feet with 2 turns activity did not occur: Safety/medical concerns         Walk 150 feet activity   Assist Walk 150 feet activity did not occur: Safety/medical concerns         Walk 10 feet on uneven surface  activity   Assist Walk 10 feet on uneven surfaces activity did not occur: Safety/medical concerns         Wheelchair     Assist Will patient use wheelchair at discharge?: Yes Type of Wheelchair: Manual    Wheelchair assist level: Supervision/Verbal cueing Max wheelchair distance: 150'    Wheelchair 50 feet with  2 turns activity    Assist        Assist Level: Supervision/Verbal cueing   Wheelchair 150 feet activity     Assist      Assist Level: Supervision/Verbal cueing   Blood pressure 107/76, pulse 69, temperature 97.9 F (36.6 C), temperature source Oral, resp. rate 17, height '5\' 11"'$  (1.803 m), weight (!) 143.6 kg, SpO2 100 %.  Medical Problem List and Plan: 1.  S/p transmetatarsal amputation of left foot Continue CIR 2.  Antithrombotics: -DVT/anticoagulation:  Pharmaceutical: Other (comment)--eliquis             -antiplatelet therapy: N/A 3. Postoperative pain: Decrease hydrocodone to 1 tab q12H PRN.   Controlled on 8/15 4. Mood: LCSW to follow for evaluation and support.             -antipsychotic agents: N/A 5. Neuropsych: This patient is capable of making decisions on his own behalf. 6. Skin/Wound Care: Daily dressing changes to left foot. Pressure relief measures             --will order nystatin powder for candidal rash inguinal areas.  Gerhardt ordered 7. Fluids/Electrolytes/Nutrition: Monitor I/Os. 8. Pre-diabetes: Hgb A1c-6.4 and discussed treating as new onset DM             --monitor BS ac/ch. CM diet  -recommended against Ensure due to its added sugar- discussed alternative foods that are good for wound healing  Mildly elevated on 8/15. D/c ISS and CBGs.  9.  Chronic systolic CHF/HTN: Compensated. Will monitor weights daily. Low salt diet. --continue Demadex, Lipitor, Coreg and Losartan. Blood pressure is labile on 8/14, monitor for trend Filed Weights   08/22/20 0533 08/23/20 0458 08/24/20 0500  Weight: (!) 146.5 kg (!) 143.6 kg (!) 143.6 kg  10. Hypoalbuminemia: Added juven bid to promote wound healing. 11. Gout flare: Started on colchicine on 08/09 12.  Osteomyelitis left toes s/p amputation: To continue Augmentin/doxycycline for 2 weeks--end date 08/14. Vitamin D level ordered.  13. Hyponatremia: Stable. Recheck tomorrow 14. A fib: Monitor HR  tid--continue coreg and eliquis. Magnesium level is 1.8. Supplement 2gm IV on 8/12. Will not supplement further given diarrhea.  15. Peripheral arterial disease: continue nitrodur 0.'1mg'$  transdermal patch on left tibia. Discussed possible side effects to look out for- he did not experience any.  16. Urinary frequency with Torsemide: changed to with lunch as per his request.  17. Bradycardia: decrease Coreg dose. Discussed with patient.  18.  Diarrhea  Likely secondary to antibiotics  FiberCon started   LOS: 6 days A FACE TO FACE EVALUATION WAS PERFORMED  Clide Deutscher Milos Milligan 08/24/2020, 6:55 PM

## 2020-08-24 NOTE — Progress Notes (Signed)
Occupational Therapy Session Note  Patient Details  Name: [name] MRN: [id number] Date of Birth: Mar 22, 1955  Today's Date: 08/24/2020 OT Individual Time: 0930-1030 OT Individual Time Calculation (min): 60 min    Short Term Goals: Week 1:  OT Short Term Goal 1 (Week 1): Pt will complete BSC/toilet transfer with LRAD and good precaution adherence with mod A OT Short Term Goal 2 (Week 1): Pt will perform LB dress w/ AE PRN with Mod A OT Short Term Goal 3 (Week 1): Pt will perform bathing tasks with no more than Min A with AE PRN OT Short Term Goal 4 (Week 1): Pt will perform sit <> stands in prep for ADL with Min A  Skilled Therapeutic Interventions/Progress Updates:    Pt received supine with no c/o pain. Good recall of need for darco shoe and NWB LLE. Pt completed bed mobility with (S). Bed elevated to assist with sit > stand, pt reporting same height as home surfaces. CGA to stand and then to pivot to w/c with CGA, cueing required for NWB LLE- more like TDWB during initial and terminal phase of transfer. Pt navigated w/c into the bathroom with min A before transferring into the shower with CGA using grab bar. LLE and R peripheral IV occluded for water. Pt completed bathing seated with set up assist. Poor compliance with LLE NWB with transfer out of shower and with standing level LB dressing. Pt was however able to dress with CGA, UB with supervision. From the chair pt completed oral care and grooming tasks with set up assist. Pt was left sitting up with all needs met.   Therapy Documentation Precautions:  Precautions Precautions: Fall Precaution Comments: venous stasis, NWB LLE w/ DARCO Required Braces or Orthoses: Other Brace Other Brace: L darco shoe Restrictions Weight Bearing Restrictions: Yes LLE Weight Bearing: Non weight bearing Other Position/Activity Restrictions: Pt unable to come to full stance without breaking LLE NWB precautions.   Therapy/Group: Individual  Therapy  Curtis Sites 08/24/2020, 6:17 AM

## 2020-08-24 NOTE — Progress Notes (Signed)
Occupational Therapy Session Note  Patient Details  Name: [name] MRN: [id number] Date of Birth: 11-Dec-1955  Today's Date: 08/24/2020 OT Individual Time: 8472-0721 OT Individual Time Calculation (min): 28 min    Short Term Goals: Week 1:  OT Short Term Goal 1 (Week 1): Pt will complete BSC/toilet transfer with LRAD and good precaution adherence with mod A OT Short Term Goal 2 (Week 1): Pt will perform LB dress w/ AE PRN with Mod A OT Short Term Goal 3 (Week 1): Pt will perform bathing tasks with no more than Min A with AE PRN OT Short Term Goal 4 (Week 1): Pt will perform sit <> stands in prep for ADL with Min A   Skilled Therapeutic Interventions/Progress Updates:    Pt greeted at time of session sitting up in wheelchair with RN finishing up med pass, no pain reported throughout session. Self propel room <> gym with Supervision and extended time, focus on body mechanics and form with sit <> stands 3 trials with good NWB adherence today compared to previous sessions and CGA throughout, difficulty controlling descent x1 with fatigue. 2x35 ball throws/hits with 2# dowel for global cardio endurance training with one brief rest break. Back in room, call bell in reach all needs met.   Therapy Documentation Precautions:  Precautions Precautions: Fall Precaution Comments: venous stasis, NWB LLE w/ DARCO Required Braces or Orthoses: Other Brace Other Brace: L darco shoe Restrictions Weight Bearing Restrictions: Yes LLE Weight Bearing: Non weight bearing Other Position/Activity Restrictions: Pt unable to come to full stance without breaking LLE NWB precautions.    Therapy/Group: Individual Therapy  Viona Gilmore 08/24/2020, 12:54 PM

## 2020-08-24 NOTE — Progress Notes (Signed)
Physical Therapy Session Note  Patient Details  Name: [name] MRN: [id number] Date of Birth: Oct 13, 1955  Today's Date: 08/24/2020 PT Individual Time: 1440-1525 PT Individual Time Calculation (min): 45 min   Short Term Goals: Week 1:  PT Short Term Goal 1 (Week 1): Pt will complete bed mobility with supervision PT Short Term Goal 2 (Week 1): Pt will complete sit<>stand transfer with minA and LRAD PT Short Term Goal 3 (Week 1): Pt will complete bed<>chair transfers with minA and LRAD PT Short Term Goal 4 (Week 1): Pt will initiate pre-gait training  Skilled Therapeutic Interventions/Progress Updates:     Pt seated in w/c to start session and agreeable to PT tx. No reports of pain. Pt propelled himself with supervision in w/c to ortho rehab gym, ~34f. Completed stand<>pivot transfer with minA to mat table with cues for NWB compliance to LLE. Problem solved bathroom setup to replicate in rehab gym to simulate functional transfers as he will be required to hop a few feet into the bathroom. Arm chair placed ~369faway from him on his R. Sit<>stand to RW from mat table with minA and he was able to hop ~[id number] minA and RW to arm chair - compliant with NWB to LLE throughout. After brief seated rest, hopped back to the mat table with minA and RW but this time had greater difficulty producing efficient hops and foot clearance with fair compliance of NWB status. After prolonged seated rest, continued to work on hopping where he happed ~7ft39f) with minA and RW with +2 assist for w/c follow for safety - compliant with WB status. Next, worked on car transfers where he completed with minA and RW with poor compliance of WB status during transfer due to difficulty hopping backwards on RLE. Otherwise, was able to manage his LE's into and out of the car. Completed 3 +2 minutes of UE ergometer with SCIFIT at level 5.5 resistance and 4.0 resistance. Duration limited by shoulder fatigue. Returned to room in w/c  where he remained seated with all needs in reach.  Therapy Documentation Precautions:  Precautions Precautions: Fall Precaution Comments: venous stasis, NWB LLE w/ DARCO Required Braces or Orthoses: Other Brace Other Brace: L darco shoe Restrictions Weight Bearing Restrictions: Yes LLE Weight Bearing: Non weight bearing Other Position/Activity Restrictions: Pt unable to come to full stance without breaking LLE NWB precautions. General:    Therapy/Group: Individual Therapy  ChriAlger Simons5/2022, 7:52 AM

## 2020-08-25 DIAGNOSIS — Z89432 Acquired absence of left foot: Secondary | ICD-10-CM | POA: Diagnosis not present

## 2020-08-25 MED ORDER — CARVEDILOL 3.125 MG PO TABS
3.1250 mg | ORAL_TABLET | Freq: Two times a day (BID) | ORAL | Status: DC
  Administered 2020-08-25 – 2020-08-27 (×4): 3.125 mg via ORAL
  Filled 2020-08-25 (×4): qty 1

## 2020-08-25 MED ORDER — HYDROCODONE-ACETAMINOPHEN 10-325 MG PO TABS: 1.0000 | ORAL_TABLET | Freq: Every day | ORAL | Status: DC | PRN

## 2020-08-25 NOTE — Patient Care Conference (Signed)
Inpatient RehabilitationTeam Conference and Plan of Care Update Date: 08/25/2020   Time: 1:14 PM    Patient Name: [name]      Medical Record Number: [id number]  Date of Birth: Dec 07, 1955 Sex: Male         Room/Bed: 4M13C/4M13C-01 Payor Info: Payor: [place] / Plan: [employer] MEDICARE / Product Type: *No Product type* /    Admit Date/Time:  08/18/2020  1:41 PM  Primary Diagnosis:  Status post transmetatarsal amputation of left foot [facility])  Hospital Problems: Principal Problem:   Status post transmetatarsal amputation of left foot ([place]) Active Problems:   Hyponatremia   Chronic systolic congestive heart failure (HCC)   Prediabetes   Labile blood pressure   Essential hypertension   Postoperative pain    Expected Discharge Date: Expected Discharge Date: 08/14/20 (Transfered to acute 08/14/2020)  Team Members Present:       Current Status/Progress Goal Weekly Team Focus  Bowel/Bladder   Patient is continent x2. LBM 08/24/20  Maintain continent x2.  Staff will continue to offer toileting q2h and per patient request.   Swallow/Nutrition/ Hydration             ADL's             Mobility   supervision bed mobility with bed features, minA sit<>stand and stand<>pivot transfers using bari-RW (unable to hop backwards during transfer to get closer to seat prior to sitting), hopping ~5-87f with minA and RW, w/c propulsion 1552fsupervision. Working on improving LLE NWB during transfers and functional mobility  supervision transfers, mod-I w/c level mobility  UE strengthening, endurance/activity tolerance, transfer training, standing balance, gait training, w/c management and mobility, pt education, DME training   Communication             Safety/Cognition/ Behavioral Observations            Pain   Patient denies pain at this time.  Patient will report a pain level of < 3.  Staff will assess patient's pain level q shift and prn. Utilize prn pain medication when  needed.   Skin   Surgical incision-post transmetatarsal amputation of left foot. Surgical site free from s/s of infection.  Surgical site will continue to be free from s/s of infection.  Staff will assess left foot surgical incision q shift for s/s of infection. Continue dressing changes per order.     Discharge Planning:  Home with sister and friend assisting with his care. Family education today with both to prepare for discharge home   Team Discussion: Nitro patch to left foot for circulation and magnesium supplement per MD. Patient doing well overall but has difficulty maintaining weight bearing restrictions on left leg.  Patient on target to meet rehab goals: yes, currently mod I for bed mobility and min assist for transfers.  *See Care Plan and progress notes for long and short-term goals.   Revisions to Treatment Plan:  Practice with bumping up steps and curb Teaching Needs: Medications, skin care, safety, weight bearing restrictions, etc  Current Barriers to Discharge: Decreased caregiver support, Home enviroment access/layout, and Wound care  Possible Resolutions to Barriers: Family education completed 08/25/20 [facility] services recommended     Medical Summary               I attest that I was present, lead the team conference, and concur with the assessment and plan of the team.   ShDorien Chihuahua 08/25/2020, 1:14 PM

## 2020-08-25 NOTE — Progress Notes (Signed)
Stopped by to see the patient.  States he has been doing with physical therapy.  He seems to be in good spirits.  Likely discharge on Thursday.  I will change the bandage tomorrow.  Celesta Gentile, DPM

## 2020-08-25 NOTE — Progress Notes (Signed)
PROGRESS NOTE   Subjective/Complaints: No complaints this morning. Family did well with caregiver training Continues to have diarrhea, but it is improving Denies pain  ROS:  Denies CP, shortness of breath, nausea, vomiting, pain.   Objective:   No results found. Recent Labs    08/24/20 0745  WBC 6.4  HGB 13.8  HCT 43.6  PLT 291   Recent Labs    08/24/20 0745  NA 138  K 3.8  CL 102  CO2 28  GLUCOSE 141*  BUN 21  CREATININE 1.05  CALCIUM 9.5    Intake/Output Summary (Last 24 hours) at 08/25/2020 0853 Last data filed at 08/25/2020 0700 Gross per 24 hour  Intake 594 ml  Output 2850 ml  Net -2256 ml         Physical Exam: Vital Signs Blood pressure 137/78, pulse 64, temperature 98 F (36.7 C), temperature source Oral, resp. rate 20, height '5\' 11"'$  (1.803 m), weight (!) 139 kg, SpO2 96 %. Gen: no distress, normal appearing HEENT: oral mucosa pink and moist, NCAT Cardio: Reg rate Chest: normal effort, normal rate of breathing Abd: soft, non-distended Ext: no edema Psych: pleasant, normal affect Skin: Warm and dry.  Left foot with dressing CDI. Psych: Normal mood.  Normal behavior. Musc: Lower extreme edema.  No tenderness in extremities. Neuro: Alert and oriented Motor: Grossly intact, some limitations due to pain and dressing.  Assessment/Plan: 1. Functional deficits which require 3+ hours per day of interdisciplinary therapy in a comprehensive inpatient rehab setting. Physiatrist is providing close team supervision and 24 hour management of active medical problems listed below. Physiatrist and rehab team continue to assess barriers to discharge/monitor patient progress toward functional and medical goals  Care Tool:  Bathing    Body parts bathed by patient: Right arm, Chest, Abdomen, Left arm, Face   Body parts bathed by helper: Right lower leg Body parts n/a: Left lower leg   Bathing assist  Assist Level: Set up assist     Upper Body Dressing/Undressing Upper body dressing   What is the patient wearing?: Pull over shirt    Upper body assist Assist Level: Set up assist    Lower Body Dressing/Undressing Lower body dressing      What is the patient wearing?: Pants     Lower body assist Assist for lower body dressing: Minimal Assistance - Patient > 75%     Toileting Toileting    Toileting assist Assist for toileting: Independent with assistive device Assistive Device Comment: urinal   Transfers Chair/bed transfer  Transfers assist     Chair/bed transfer assist level: Moderate Assistance - Patient 50 - 74% Chair/bed transfer assistive device: Programmer, multimedia   Ambulation assist   Ambulation activity did not occur: Safety/medical concerns (unable to comply with NWB precautions)          Walk 10 feet activity   Assist  Walk 10 feet activity did not occur: Safety/medical concerns        Walk 50 feet activity   Assist Walk 50 feet with 2 turns activity did not occur: Safety/medical concerns         Walk 150 feet activity  Assist Walk 150 feet activity did not occur: Safety/medical concerns         Walk 10 feet on uneven surface  activity   Assist Walk 10 feet on uneven surfaces activity did not occur: Safety/medical concerns         Wheelchair     Assist Will patient use wheelchair at discharge?: Yes Type of Wheelchair: Manual    Wheelchair assist level: Supervision/Verbal cueing Max wheelchair distance: 150'    Wheelchair 50 feet with 2 turns activity    Assist        Assist Level: Supervision/Verbal cueing   Wheelchair 150 feet activity     Assist      Assist Level: Supervision/Verbal cueing   Blood pressure 137/78, pulse 64, temperature 98 F (36.7 C), temperature source Oral, resp. rate 20, height '5\' 11"'$  (1.803 m), weight (!) 139 kg, SpO2 96 %.  Medical Problem List and  Plan: 1.  S/p transmetatarsal amputation of left foot Continue CIR -Interdisciplinary Team Conference today   2.  Antithrombotics: -DVT/anticoagulation:  Pharmaceutical: Other (comment)--eliquis             -antiplatelet therapy: N/A 3. Postoperative pain: Decrease hydrocodone to 1 tab daily PRN.   Controlled on 8/16 4. Mood: LCSW to follow for evaluation and support.             -antipsychotic agents: N/A 5. Neuropsych: This patient is capable of making decisions on his own behalf. 6. Skin/Wound Care: Daily dressing changes to left foot. Pressure relief measures             --will order nystatin powder for candidal rash inguinal areas.  Continue Gerhardt  7. Fluids/Electrolytes/Nutrition: Monitor I/Os. 8. Pre-diabetes: Hgb A1c-6.4 and discussed treating as new onset DM             --monitor BS ac/ch. CM diet  -recommended against Ensure due to its added sugar- discussed alternative foods that are good for wound healing  D/c ISS and CBGs.  9.  Chronic systolic CHF/HTN: Compensated. Will monitor weights daily. Low salt diet. --continue Demadex, Lipitor, Coreg and Losartan. Blood pressure is well controlled on 8/16, monitor for trend Filed Weights   08/23/20 0458 08/24/20 0500 08/25/20 0444  Weight: (!) 143.6 kg (!) 143.6 kg (!) 139 kg  10. Hypoalbuminemia: Added juven bid to promote wound healing. 11. Gout flare: Started on colchicine on 08/09 12.  Osteomyelitis left toes s/p amputation: To continue Augmentin/doxycycline for 2 weeks--end date 08/14. Vitamin D level ordered.  13. Hyponatremia: resolved 14. A fib: Monitor HR tid--continue coreg and eliquis. Magnesium level is 1.8. Supplement 2gm IV on 8/12. Will not supplement further given diarrhea.  15. Peripheral arterial disease: continue nitrodur 0.'1mg'$  transdermal patch on left tibia. Discussed possible side effects to look out for- he did not experience any.  16. Urinary frequency with Torsemide: changed to with lunch as per his  request.  17. Bradycardia: decrease Coreg to 3.'125mg'$  BID. Discussed with patient.  18.  Diarrhea  Likely secondary to antibiotics  FiberCon started   LOS: 7 days A FACE TO FACE EVALUATION WAS PERFORMED  Clide Deutscher Kameo Bains 08/25/2020, 8:53 AM

## 2020-08-25 NOTE — Progress Notes (Signed)
Patient ID: [name], male   DOB: Jan 25, 1955, 65 y.o.   MRN: [id number]  Met with pt and sister along with friend who are here for education in preparation of discharge home this week. He does need wheelchair and bariatric commode-ordered via [employer]. Pt prefers home health. Update him regarding team conference goals of supervision-independent level and discharge 8/18. He is very pleased with this plan. Will work toward discharge Thursday

## 2020-08-25 NOTE — Progress Notes (Signed)
Physical Therapy Session Note  Patient Details  Name: [name] MRN: [id number] Date of Birth: 05/19/1955  Today's Date: 08/25/2020 PT Individual Time: 1000-1100 + 1400-1443 PT Individual Time Calculation (min): 60 min  + 43 min  Short Term Goals: Week 1:  PT Short Term Goal 1 (Week 1): Pt will complete bed mobility with supervision PT Short Term Goal 2 (Week 1): Pt will complete sit<>stand transfer with minA and LRAD PT Short Term Goal 3 (Week 1): Pt will complete bed<>chair transfers with minA and LRAD PT Short Term Goal 4 (Week 1): Pt will initiate pre-gait training  Skilled Therapeutic Interventions/Progress Updates:     1st session: Pt supine in bed to start session. Sister and a friend at bedside for scheduled family ed. Focus of session to complete family education and training in order to prepare for upcoming DC home. Lengthy discussion at start of session regarding pt's mobility status, LLE NWB status, DME rec's, role of f/u therapies, and home safety. Emphasized importance of monitoring skin integrity for both L foot AND R foot with use of hand mirror, daily. All questions and concerns addressed and family appreciative and report confidence in assisting pt when he returns home. Bed mobility completed mod I with bed features. Sit<>stand to RW with CGA from elevated bed height while maintaining NWB status. Stand<>pivot transfer with CGA and RW to w/c. He propels himself in w/c mod I with BUE, ~122f, prior to fatigue. Transported pt remaining distance outside to practice navigating curb in w/c as pt has 1 STE home. PT provided demonstration with pt going up curb facing forwards and down curb facing backwards. Sister than assisted with dependent w/c curb transfer and demonstrated good body mechanics and ability to manage pt in w/c. Pt returned upstairs and completed car transfer with CGA and RW with car height set to simulate their Range Rover - family actively observing with thoughtful  questions regarding care. Ptt returned to his room at end of session where he remained seated in w/c with all needs in reach. All questions and concerns addressed from family.   2nd session: Pt greeted seated in w/c to start session, agreeable to therapy with no reports of pain. Pt propelled himself ~1521fto main rehab gym with supervision on level ground. Completed stand<>pivot transfer with CGA and RW to mat table with cues for NWB to LLE. Completed seated there-ex: -1x10 shldr press with 6# dumbbell -1x10 bicep curl with 6# dumbbell -1x10 hammer curl with 6# dumbbell -1x10 shldr abd with 3# dowel rod -1x10 rows with 3# dowel rod -1x10 chest press with 3# dowel rod  Pt completed stand<>pivot transfer with minA and RW towards L side with increased difficulty complying with NWB status. Transported back to his room with totalA for time. Remained seated in w/c with all needs in reach at end of session.  Therapy Documentation Precautions:  Precautions Precautions: Fall Precaution Comments: venous stasis, NWB LLE w/ DARCO Required Braces or Orthoses: Other Brace Other Brace: L darco shoe Restrictions Weight Bearing Restrictions: Yes LLE Weight Bearing: Non weight bearing Other Position/Activity Restrictions: Pt unable to come to full stance without breaking LLE NWB precautions. General:     Therapy/Group: Individual Therapy  Rayne Loiseau P Terese Heier 08/25/2020, 7:26 AM

## 2020-08-25 NOTE — Progress Notes (Signed)
Occupational Therapy Session Note  Patient Details  Name: TODRICK OLSHEFSKI MRN: [id number] Date of Birth: 26-Jan-1955  Today's Date: 08/25/2020 OT Individual Time: 1300-1325 OT Individual Time Calculation (min): 25 min    Short Term Goals: Week 1:  OT Short Term Goal 1 (Week 1): Pt will complete BSC/toilet transfer with LRAD and good precaution adherence with mod A OT Short Term Goal 2 (Week 1): Pt will perform LB dress w/ AE PRN with Mod A OT Short Term Goal 3 (Week 1): Pt will perform bathing tasks with no more than Min A with AE PRN OT Short Term Goal 4 (Week 1): Pt will perform sit <> stands in prep for ADL with Min A  Skilled Therapeutic Interventions/Progress Updates:    Pt resting in recliner upon arrival. Initial focus on discharge planning. Pt will be staying downstairs and sleeping in recliner. With the exception of bathroom (1/2 bath), living quarters w/c accessible. Pt will have 24 hour assistance. Pt propelled w/c to ortho gym and engaged in standing activity x 2 at [employer]. No LOB noted. Pt propelled w/c to gym and engaged in rowing with 4# bar 2x20 in opposite directions. Pt propelled back to room and remained in w/c. All needs within reach.  Therapy Documentation Precautions:  Precautions Precautions: Fall Precaution Comments: venous stasis, NWB LLE w/ DARCO Required Braces or Orthoses: Other Brace Other Brace: L darco shoe Restrictions Weight Bearing Restrictions: Yes LLE Weight Bearing: Non weight bearing Other Position/Activity Restrictions: Pt unable to come to full stance without breaking LLE NWB precautions. Pain:  Pt denies pain this afternoon    Therapy/Group: Individual Therapy  Leroy Libman 08/25/2020, 3:16 PM

## 2020-08-25 NOTE — Progress Notes (Signed)
Patient refused CPAP at this time. On stand-by at bedside.Patient aware to call for assistance if needed.

## 2020-08-26 ENCOUNTER — Encounter: Payer: Self-pay | Admitting: Internal Medicine

## 2020-08-26 ENCOUNTER — Other Ambulatory Visit: Payer: Self-pay | Admitting: Cardiology

## 2020-08-26 DIAGNOSIS — Z89432 Acquired absence of left foot: Secondary | ICD-10-CM | POA: Diagnosis not present

## 2020-08-26 MED ORDER — TORSEMIDE 20 MG PO TABS: 20.0000 mg | ORAL_TABLET | Freq: Every day | ORAL | 2 refills | Status: DC

## 2020-08-26 MED ORDER — VITAMIN D3 25 MCG (1000 UNIT) PO TABS: 1000.0000 [IU] | ORAL_TABLET | Freq: Every day | ORAL | 0 refills | Status: DC

## 2020-08-26 MED ORDER — NITROGLYCERIN 0.1 MG/HR TD PT24: 0.1000 mg | MEDICATED_PATCH | Freq: Every day | TRANSDERMAL | 12 refills | Status: DC

## 2020-08-26 MED ORDER — SERTRALINE HCL 50 MG PO TABS: 50.0000 mg | ORAL_TABLET | Freq: Every day | ORAL | 0 refills | Status: AC

## 2020-08-26 MED ORDER — HYDROCODONE-ACETAMINOPHEN 10-325 MG PO TABS: 1.0000 | ORAL_TABLET | Freq: Every day | ORAL | 0 refills | Status: DC | PRN

## 2020-08-26 MED ORDER — CALCIUM POLYCARBOPHIL 625 MG PO TABS: 625.0000 mg | ORAL_TABLET | Freq: Every day | ORAL | 0 refills | Status: DC

## 2020-08-26 MED ORDER — ASCORBIC ACID 500 MG PO TABS: 500.0000 mg | ORAL_TABLET | Freq: Three times a day (TID) | ORAL | 0 refills | Status: DC

## 2020-08-26 MED ORDER — APIXABAN 5 MG PO TABS: 5.0000 mg | ORAL_TABLET | Freq: Two times a day (BID) | ORAL | 1 refills | Status: DC

## 2020-08-26 MED ORDER — KLOR-CON M20 20 MEQ PO TBCR: 20.0000 meq | EXTENDED_RELEASE_TABLET | Freq: Every day | ORAL | 1 refills | Status: DC

## 2020-08-26 MED ORDER — COLCHICINE 0.6 MG PO TABS
0.6000 mg | ORAL_TABLET | Freq: Two times a day (BID) | ORAL | 0 refills | Status: DC
Start: 2020-08-26 — End: 2020-11-09

## 2020-08-26 MED ORDER — OMEGA 3 340 MG PO CPDR: 340.0000 mg | DELAYED_RELEASE_CAPSULE | Freq: Two times a day (BID) | ORAL | 0 refills | Status: AC

## 2020-08-26 MED ORDER — TORSEMIDE 20 MG PO TABS: 20.0000 mg | ORAL_TABLET | Freq: Every day | ORAL | 0 refills | Status: DC

## 2020-08-26 MED ORDER — OMEPRAZOLE 40 MG PO CPDR: 40.0000 mg | DELAYED_RELEASE_CAPSULE | Freq: Every day | ORAL | 0 refills | Status: AC

## 2020-08-26 MED ORDER — LOSARTAN POTASSIUM 50 MG PO TABS: 50.0000 mg | ORAL_TABLET | Freq: Every day | ORAL | 0 refills | Status: DC

## 2020-08-26 MED ORDER — ATORVASTATIN CALCIUM 20 MG PO TABS: ORAL_TABLET | ORAL | 5 refills | Status: DC

## 2020-08-26 MED ORDER — ALBUTEROL SULFATE HFA 108 (90 BASE) MCG/ACT IN AERS: 2.0000 | INHALATION_SPRAY | Freq: Four times a day (QID) | RESPIRATORY_TRACT | 2 refills | Status: DC | PRN

## 2020-08-26 MED ORDER — CARVEDILOL 3.125 MG PO TABS: 3.1250 mg | ORAL_TABLET | Freq: Two times a day (BID) | ORAL | 0 refills | Status: DC

## 2020-08-26 MED ORDER — VITAMIN B-12 1000 MCG PO TABS: 1000.0000 ug | ORAL_TABLET | Freq: Every day | ORAL | 0 refills | Status: AC

## 2020-08-26 NOTE — Progress Notes (Signed)
Occupational Therapy Discharge Summary  Patient Details  Name: [name] MRN: [id number] Date of Birth: 08-28-1955  Today's Date: 08/26/2020 OT Individual Time: 1000-1100 OT Individual Time Calculation (min): 60 min    Patient has met 5 of 6 long term goals due to improved activity tolerance, improved balance, postural control, and ability to compensate for deficits.  Patient to discharge at overall Supervision level.  Patient's care partner is independent to provide the necessary physical and cognitive assistance at discharge.    Reasons goals not met: Pt has difficulty hopping on RLE for stand pivot to commode and still requires min assist to complete safely.  Recommendation:  Home Health OT to facilitate increased independence and safety during functional transfers including toilet transfers and shower transfers.  Equipment: Bariatric bedside commode  Skilled Intervention: Pt in bed, requesting to wash up and use the toilet.  Independent bed mobility supine to sit.  CGA stand pivot EOB (elevated) to w/c using RW.  Pt self propelled to sink and completed all grooming, bathing, and dressing with mod I.  Self propelled to bathroom and required min assist for stand pivot w/c (with slight TTWB through LLE despite multimodal cues for NWB) to commode with use of RW and grab bars.  Mod I for toileting after continent episode of bowel.  Min assist stand pivot back to w/c.  Call bell in reach, seat alarm on at end of session.  Reasons for discharge: treatment goals met and discharge from hospital  Patient/family agrees with progress made and goals achieved: Yes  OT Discharge Precautions/Restrictions  Precautions Precautions: Fall Precaution Comments: NWB LLE w/ DARCO (post-op shoe) Required Braces or Orthoses: Other Brace Other Brace: L darco shoe Restrictions Weight Bearing Restrictions: Yes LLE Weight Bearing: Non weight bearing Other Position/Activity Restrictions: Pt with  difficulty complying to LLE NWB precautions during transfers towards his L side. Pain Pain Assessment Pain Scale: 0-10 Pain Score: 0-No pain ADL ADL Grooming: Modified independent Where Assessed-Grooming: Sitting at sink Upper Body Bathing: Modified independent Where Assessed-Upper Body Bathing: Sitting at sink Lower Body Bathing: Modified independent Where Assessed-Lower Body Bathing: Sitting at sink, Standing at sink Upper Body Dressing: Modified independent (Device) Where Assessed-Upper Body Dressing: Other (Comment) (shower bench) Lower Body Dressing: Modified independent Where Assessed-Lower Body Dressing: Sitting at sink, Standing at sink Toileting: Modified independent Where Assessed-Toileting: Bed level Toilet Transfer: Minimal assistance Toilet Transfer Method: Stand pivot Science writer: Extra wide drop arm bedside commode Vision Baseline Vision/History: No visual deficits Patient Visual Report: No change from baseline Vision Assessment?: No apparent visual deficits Perception  Perception: Within Functional Limits Praxis Praxis: Intact Cognition Overall Cognitive Status: Within Functional Limits for tasks assessed Arousal/Alertness: Awake/alert Attention: Focused;Sustained;Selective Focused Attention: Appears intact Sustained Attention: Appears intact Selective Attention: Appears intact Memory: Appears intact Immediate Memory Recall: Sock;Blue;Bed Memory Recall Sock: Without Cue Memory Recall Blue: Without Cue Memory Recall Bed: Without Cue Awareness: Appears intact Problem Solving: Appears intact Safety/Judgment: Appears intact Sensation Sensation Light Touch: Impaired Detail Hot/Cold: Not tested Proprioception: Appears Intact Stereognosis: Appears Intact Coordination Gross Motor Movements are Fluid and Coordinated: No Fine Motor Movements are Fluid and Coordinated: Yes Coordination and Movement Description: grossly uncoordinated d/t NWB,  body habitus, and global deconditioning Motor  Motor Motor: Abnormal postural alignment and control Motor - Discharge Observations: Coordination and confidence with motor skills has improved since date of evaluation but remains persistent Mobility  Bed Mobility Bed Mobility: Supine to Sit;Sit to Supine;Rolling Left;Rolling Right Rolling Right: Independent Rolling  Left: Independent Left Sidelying to Sit: Independent with assistive device Supine to Sit: Independent with assistive device Sit to Supine: Independent with assistive device Transfers Sit to Stand: Contact Guard/Touching assist Stand to Sit: Contact Guard/Touching assist  Trunk/Postural Assessment  Cervical Assessment Cervical Assessment: Within Functional Limits Thoracic Assessment Thoracic Assessment: Exceptions to [facility] (rounded shoulders) Lumbar Assessment Lumbar Assessment: Within Functional Limits Postural Control Postural Control: Within Functional Limits  Balance Balance Balance Assessed: Yes Static Sitting Balance Static Sitting - Balance Support: No upper extremity supported;Feet supported Static Sitting - Level of Assistance: 7: Independent Dynamic Sitting Balance Dynamic Sitting - Balance Support: Feet supported Dynamic Sitting - Level of Assistance: 6: Modified independent (Device/Increase time) Static Standing Balance Static Standing - Balance Support: Bilateral upper extremity supported Static Standing - Level of Assistance: 5: Stand by assistance Dynamic Standing Balance Dynamic Standing - Balance Support: During functional activity;Bilateral upper extremity supported Dynamic Standing - Level of Assistance: 4: Min assist Extremity/Trunk Assessment RUE Assessment RUE Assessment: Within Functional Limits LUE Assessment LUE Assessment: Within Functional Limits   [name] 08/26/2020, 2:53 PM

## 2020-08-26 NOTE — Progress Notes (Signed)
Subjective: 65 year old male status post transmetatarsal amputation left foot on August 05, 2020.  Scheduled for discharge tomorrow morning he reports.  States has been doing well with physical therapy.  He has no fevers or chills.  Has no other concerns.  Objective: AAO x3, NAD On the left foot status post transmetatarsal amputation with sutures, staples intact.  There is no dehiscence.  Slight amount of dried bloody drainage on the bandage.  There is no evidence of dehiscence to the central aspect size and motion across the incision.  There is no drainage or pus.  Mild edema to the foot there is no erythema or warmth.  No fluctuation crepitation.  No malodor. No pain with calf compression, swelling, warmth, erythema  Assessment: Status post left transmetatarsal amputation; right toe ulcerations without signs of infection  Plan: Dressing was changed today.  Dressings were changed today.  Xeroform was applied followed by dry sterile dressing.  Continue daily dressing changes.  Elevation.  He can be discharged tomorrow most likely I will see him back in the office on Monday.  His family will do dressing changes at home and feels comfortable doing this.  Continue nonweightbearing.  Celesta Gentile, DPM

## 2020-08-26 NOTE — Progress Notes (Signed)
Inpatient Rehabilitation Care Coordinator Discharge Note  The overall goal for the admission was met for:   Discharge location: Yes-HOME WITH SISTER AND FRIEND ASSISTING  Length of Stay: Yes-10 DAYS  Discharge activity level: Yes-MOD/I-SUPERVISION AMBULATION  Home/community participation: Yes  Services provided included: MD, RD, PT, OT, RN, CM, Pharmacy, and SW  Financial Services: Other: [employer] MEDICARE  Choices offered to/list presented to:PT AND SISTER  Follow-up services arranged: Home Health: [facility] -PT & OT, DME: [facility]-WHEELCHAIR, and Patient/Family has no preference for HH/DME agencies Will take home a 20 x 18 wheelchairuntil a 22 x 18  can be switched out at his home. Pt aware and in agreement with this plan  Comments (or additional information):SISTER AND FRIEND WERE HERE FOR EDUCATION AND IT WENT WELL FEEL PREPARED FOR DISCHARGE  Patient/Family verbalized understanding of follow-up arrangements: Yes  Individual responsible for coordination of the follow-up plan: SELF [phone]  Confirmed correct DME delivered: Dupree, Rebecca G 08/26/2020    Dupree, Rebecca G 

## 2020-08-26 NOTE — Progress Notes (Signed)
Pt refusing CPAP tonight. Advised pt to notify for RT if he changes his mind.

## 2020-08-26 NOTE — Discharge Summary (Signed)
Physical Therapy Discharge Summary  Patient Details  Name: [name] MRN: [id number] Date of Birth: September 06, 1955  Today's Date: 08/26/2020 PT Individual Time: 1100-1200 + 1430-1530 PT Individual Time Calculation (min): 60 min  + 60 min  Patient has met 6 of 11 long term goals due to improved activity tolerance, improved balance, improved postural control, increased strength, ability to compensate for deficits, and improved coordination.  Patient to discharge at a wheelchair level [place].   Patient's care partner is independent to provide the necessary physical and cognitive assistance at discharge.  Reasons goals not met: Ambulation goal set to 36f but pt able to only hop ~5-13fprior to fatigue. Pt requires supervision for all mobility due to difficulty complying with NWB status to L foot. He also requires light minA for balance during transfers. To manage his 1 step to enter home, he requires a dependant transfer in w/c as he's unable to hop up the step. Family education as been provided to sister and his friend.  Recommendation:  Patient will benefit from ongoing skilled PT services in home health setting to continue to advance safe functional mobility, address ongoing impairments in functional mobility including gait, transfers, and w/c mobility/management in order to reduce caregiver burden and minimize fall risk.  Equipment: 22x18 w/c. Pt owns a RW  Reasons for discharge: treatment goals met and discharge from hospital  Patient/family agrees with progress made and goals achieved: Yes  PT Discharge Precautions/Restrictions Precautions Precautions: Fall Precaution Comments: NWB LLE w/ DARCO (post-op shoe) Required Braces or Orthoses: Other Brace Other Brace: L darco shoe Restrictions Weight Bearing Restrictions: Yes LLE Weight Bearing: Non weight bearing Other Position/Activity Restrictions: Pt with difficulty complying to LLE NWB precautions during transfers towards his L  side. Vital Signs Therapy Vitals Temp: (!) 97.5 F (36.4 C) Temp Source: Oral Pulse Rate: 67 Resp: 17 BP: 131/74 Patient Position (if appropriate): Lying Oxygen Therapy SpO2: 96 % O2 Device: Room Air Vision/Perception  Perception Perception: Within Functional Limits Praxis Praxis: Intact  Cognition Overall Cognitive Status: Within Functional Limits for tasks assessed Arousal/Alertness: Awake/alert Orientation Level: Oriented X4 Attention: Focused;Sustained;Selective Focused Attention: Appears intact Sustained Attention: Appears intact Selective Attention: Appears intact Memory: Appears intact Awareness: Appears intact Problem Solving: Appears intact Safety/Judgment: Appears intact Sensation Sensation Light Touch: Impaired Detail Peripheral sensation comments: diminished from mid-shin distally Light Touch Impaired Details: Impaired RLE;Impaired LLE Hot/Cold: Not tested Proprioception: Appears Intact Stereognosis: Appears Intact Coordination Gross Motor Movements are Fluid and Coordinated: No Fine Motor Movements are Fluid and Coordinated: Yes Coordination and Movement Description: grossly uncoordinated d/t NWB, body habitus, and global deconditioning Motor  Motor Motor: Abnormal postural alignment and control Motor - Skilled Clinical Observations: grossly uncoordinated d/t NWB LLE Motor - Discharge Observations: Coordination and confidence with motor skills has improved since date of evaluation but remains persistent  Mobility Bed Mobility Bed Mobility: Supine to Sit;Sit to Supine;Rolling Left;Rolling Right Rolling Right: Independent Rolling Left: Independent Left Sidelying to Sit: Independent with assistive device Supine to Sit: Independent with assistive device Sit to Supine: Independent with assistive device Transfers Transfers: Sit to Stand;Stand to Sit;Stand Pivot Transfers Sit to Stand: Contact Guard/Touching assist Stand to Sit: Contact Guard/Touching  assist Stand Pivot Transfers: Minimal Assistance - Patient > 75% Stand Pivot Transfer Details: Verbal cues for technique;Verbal cues for safe use of DME/AE;Verbal cues for precautions/safety;Verbal cues for sequencing;Tactile cues for weight shifting;Tactile cues for sequencing;Tactile cues for initiation;Visual cues for safe use of DME/AE;Tactile cues for weight beaing Stand  Pivot Transfer Details (indicate cue type and reason): cues for NWB LLE Transfer (Assistive device): Rolling walker Locomotion  Gait Ambulation: Yes Gait Assistance: Minimal Assistance - Patient > 75% Gait Distance (Feet): 7 Feet Assistive device: Rolling walker Gait Assistance Details: Verbal cues for gait pattern;Verbal cues for safe use of DME/AE;Verbal cues for technique;Verbal cues for precautions/safety;Verbal cues for sequencing;Tactile cues for weight shifting;Tactile cues for weight beaing Gait Assistance Details: VC for NWB LLE. VC for increasing forward hopping rather than vertical hopping to improve efficiency Gait Gait: Yes Gait Pattern: Impaired Gait Pattern: Poor foot clearance - right;Trunk flexed (hop-to gait pattern, almost shuffling on R foot. Very poor foot clearance. Unable to wear protective tennis shoe on R foot due to poor clearance) Gait velocity: decr Stairs / Additional Locomotion Stairs: Yes Stairs Assistance: Dependent - Patient 0% Stair Management Technique: Wheelchair Number of Stairs: 1 Height of Stairs: 6 Curb: Dependent - Patient 0% Product manager Mobility: Yes Wheelchair Assistance: Independent with Camera operator: Both upper extremities Wheelchair Parts Management: Supervision/cueing Distance: 129f  Trunk/Postural Assessment  Cervical Assessment Cervical Assessment: Within Functional Limits Thoracic Assessment Thoracic Assessment: Exceptions to [facility]) Lumbar Assessment Lumbar Assessment: Within Functional  Limits Postural Control Postural Control: Within Functional Limits  Balance Balance Balance Assessed: Yes Static Sitting Balance Static Sitting - Balance Support: No upper extremity supported;Feet supported Static Sitting - Level of Assistance: 7: Independent Dynamic Sitting Balance Dynamic Sitting - Balance Support: Feet supported Dynamic Sitting - Level of Assistance: 6: Modified independent (Device/Increase time) Static Standing Balance Static Standing - Balance Support: Bilateral upper extremity supported Static Standing - Level of Assistance: 5: Stand by assistance Dynamic Standing Balance Dynamic Standing - Balance Support: During functional activity;Bilateral upper extremity supported Dynamic Standing - Level of Assistance: 4: Min assist Extremity Assessment  RLE Assessment RLE Assessment: Within Functional Limits LLE Assessment LLE Assessment: Exceptions to [facility] Strength Comments: 4-/5 grossly, 3+/5 at ankle  Skilled Intervention:  1st session: Pt seated in w/c to start session - agreeable to PT tx. No reports of pain, feeling refreshed after getting a shower with OT during prior session. Pt propelled himself mod I in w/c to main rehab gym, ~1554f Positioned himself for setup to transfer without cues and able to manage w/c parts including leg rests and brakes. Sit<>stand with minA from w/c height to RW and stand<>pivot with minA for balance to mat table - cues for NWB compliance to L foot which he did a great job. Focused remainder of session on whole body strengthening with there-ex: -2x20 LAQ on R with 5# ankle weight -2x20 LAQ on L with 5# ankle weight -2x20 seated hip flex on R with 5# ankle weight -2x20 seated hip flex on L with 5# ankle weight *supine<>sit on mat table indep and used large wedge for shoulder support  -1x20 hip abduction on R with 5# ankle weight -1x20 hip abduction on L with 5# ankle weight -1x20 heel slides on R with 5# ankle weight --1x20  heel slides on L with 5# ankle weight *Supine<>sitting edge of mat indep  -1x20 D1 flex with level 2 theraband on R -1x20 D1 flex with level 2 theraband on L -1x20 lat pull downs with level 2 theraband on R -1x20 lat pull downs with level 2 theraband on L -1x20 rhomboid pull backs with level 2 theraband  Stand<>pivot transfer with minA and RW back to his w/c, decreased ability to comply with NWB status after fatigue from exercises.  Returned to his room in w/c with totalA for time and remained seated with all needs in reach at end of session.   2nd session: Pt sitting in w/c to start session - agreeable to therapy. No reports of pain. Propelled himself in w/c mod I, ~27f, to ortho rehab gym. Stand<>pivot transfer with minA and RW from w/c to mat table - cues for NWB compliance to L foot. Focused remainder of session on UE strengthening, per his request. -2x15 shldr press with 7# dowel rod -2x15 bicep curls with 7# dowel rod -2x15 chest press with 7# dowel rod -2x15 front shldr raise with 7# dowel rod -2x15 shldr abd with 5# dowel rod, bilaterally *Required rest breaks b/w sets and discussed DC planning, home safety, NWB compliance, progressions with mobility, etc during rest breaks.  Stand<>pivot with minA back to his w/c with RW and transported back to room for time. Remained seated in w/c at end of session with all needs met.  Christian P Manhard PT, DPT 08/26/2020, 7:42 AM

## 2020-08-26 NOTE — Progress Notes (Signed)
PROGRESS NOTE   Subjective/Complaints: No complaints this morning Caregiver training went well yesterday Pleased with d/c date Diarrhea is improving  ROS:  Denies CP, shortness of breath, nausea, vomiting, pain.   Objective:   No results found. Recent Labs    08/24/20 0745  WBC 6.4  HGB 13.8  HCT 43.6  PLT 291   Recent Labs    08/24/20 0745  NA 138  K 3.8  CL 102  CO2 28  GLUCOSE 141*  BUN 21  CREATININE 1.05  CALCIUM 9.5    Intake/Output Summary (Last 24 hours) at 08/26/2020 1118 Last data filed at 08/26/2020 0841 Gross per 24 hour  Intake 857 ml  Output 850 ml  Net 7 ml         Physical Exam: Vital Signs Blood pressure 131/74, pulse 67, temperature (!) 97.5 F (36.4 C), temperature source Oral, resp. rate 17, height '5\' 11"'$  (1.803 m), weight (!) 142.4 kg, SpO2 96 %. Gen: no distress, normal appearing HEENT: oral mucosa pink and moist, NCAT Cardio: Reg rate Chest: normal effort, normal rate of breathing Abd: soft, non-distended Ext: no edema Psych: pleasant, normal affect Skin: Warm and dry.  Left foot with dressing CDI. Psych: Normal mood.  Normal behavior. Musc: Lower extreme edema.  No tenderness in extremities. Neuro: Alert and oriented Motor: Grossly intact, some limitations due to pain and dressing.  Assessment/Plan: 1. Functional deficits which require 3+ hours per day of interdisciplinary therapy in a comprehensive inpatient rehab setting. Physiatrist is providing close team supervision and 24 hour management of active medical problems listed below. Physiatrist and rehab team continue to assess barriers to discharge/monitor patient progress toward functional and medical goals  Care Tool:  Bathing    Body parts bathed by patient: Right arm, Chest, Abdomen, Left arm, Face   Body parts bathed by helper: Right lower leg Body parts n/a: Left lower leg   Bathing assist Assist Level:  Set up assist     Upper Body Dressing/Undressing Upper body dressing   What is the patient wearing?: Pull over shirt    Upper body assist Assist Level: Set up assist    Lower Body Dressing/Undressing Lower body dressing      What is the patient wearing?: Pants     Lower body assist Assist for lower body dressing: Minimal Assistance - Patient > 75%     Toileting Toileting    Toileting assist Assist for toileting: Independent with assistive device Assistive Device Comment: urinal   Transfers Chair/bed transfer  Transfers assist     Chair/bed transfer assist level: Minimal Assistance - Patient > 75% Chair/bed transfer assistive device: Programmer, multimedia   Ambulation assist   Ambulation activity did not occur: Safety/medical concerns (unable to comply with NWB precautions)  Assist level: Minimal Assistance - Patient > 75% Assistive device: Walker-rolling Max distance: 82f   Walk 10 feet activity   Assist  Walk 10 feet activity did not occur: Safety/medical concerns        Walk 50 feet activity   Assist Walk 50 feet with 2 turns activity did not occur: Safety/medical concerns  Walk 150 feet activity   Assist Walk 150 feet activity did not occur: Safety/medical concerns         Walk 10 feet on uneven surface  activity   Assist Walk 10 feet on uneven surfaces activity did not occur: Safety/medical concerns         Wheelchair     Assist Will patient use wheelchair at discharge?: Yes Type of Wheelchair: Manual    Wheelchair assist level: Supervision/Verbal cueing Max wheelchair distance: 150'    Wheelchair 50 feet with 2 turns activity    Assist        Assist Level: Supervision/Verbal cueing   Wheelchair 150 feet activity     Assist      Assist Level: Supervision/Verbal cueing   Blood pressure 131/74, pulse 67, temperature (!) 97.5 F (36.4 C), temperature source Oral, resp. rate 17,  height '5\' 11"'$  (1.803 m), weight (!) 142.4 kg, SpO2 96 %.  Medical Problem List and Plan: 1.  S/[name] transmetatarsal amputation of left foot Continue CIR 2.  Antithrombotics: -DVT/anticoagulation:  Pharmaceutical: Other (comment)--eliquis             -antiplatelet therapy: N/A 3. Postoperative pain: Decrease hydrocodone to 1 tab daily PRN.   Controlled on 8/17 4. Mood: LCSW to follow for evaluation and support.             -antipsychotic agents: N/A 5. Neuropsych: This patient is capable of making decisions on his own behalf. 6. Skin/Wound Care: Daily dressing changes to left foot. Pressure relief measures             --will order nystatin powder for candidal rash inguinal areas.  Continue Gerhardt  7. Fluids/Electrolytes/Nutrition: Monitor I/Os. 8. Pre-diabetes: Hgb A1c-6.4 and discussed treating as new onset DM             --monitor BS ac/ch. CM diet  -recommended against Ensure due to its added sugar- discussed alternative foods that are good for wound healing  D/c ISS and CBGs.  9.  Chronic systolic CHF/HTN: Compensated. Will monitor weights daily. Low salt diet. --continue Demadex, Lipitor, Coreg and Losartan. Blood pressure is well controlled on 8/16, monitor for trend Filed Weights   08/24/20 0500 08/25/20 0444 08/26/20 0500  Weight: (!) 143.6 kg (!) 139 kg (!) 142.4 kg  10. Hypoalbuminemia: Added juven bid to promote wound healing. 11. Gout flare: Started on colchicine on 08/09 12.  Osteomyelitis left toes s/[name] amputation: To continue Augmentin/doxycycline for 2 weeks--end date 08/14. Vitamin D level ordered.  13. Hyponatremia: resolved 14. A fib: Monitor HR tid--continue coreg and eliquis. Magnesium level is 1.8. Supplement 2gm IV on 8/12. Will not supplement further given diarrhea.  15. Peripheral arterial disease: continue nitrodur 0.'1mg'$  transdermal patch on left tibia. Discussed possible side effects to look out for- he did not experience any.  16. Urinary frequency with  Torsemide: changed to with lunch as per his request. Improved, continue at this time.  17. Bradycardia: decrease Coreg to 3.'125mg'$  BID. Discussed with patient. Continue with this lower dose.  18.  Diarrhea  Likely secondary to antibiotics  Continue FiberCon, improving   LOS: 8 days A FACE TO FACE EVALUATION WAS PERFORMED  [name] [name] 08/26/2020, 11:18 AM

## 2020-08-26 NOTE — Telephone Encounter (Signed)
Rx(s) sent to pharmacy electronically.  

## 2020-08-26 NOTE — Progress Notes (Signed)
Nutrition Follow-up  DOCUMENTATION CODES:   Morbid obesity  INTERVENTION:  Continue carbohydrate modified diet.   Encourage adequate PO intake.   NUTRITION DIAGNOSIS:   Increased nutrient needs related to wound healing as evidenced by estimated needs; ongoing  GOAL:   Patient will meet greater than or equal to 90% of their needs; met  MONITOR:   PO intake, Supplement acceptance, Skin, Weight trends, Labs, I & O's  REASON FOR ASSESSMENT:   Consult Diet education  ASSESSMENT:   65 year old male with history of follicular lymphoma,, A. fib, recent ED visits for CHF exacerbation, left great toe amputation who was admitted on 08/04/2020 with acute osteomyelitis of left second and third toe with gangrenous changes of second toe. underwent left transverse metatarsal amputation on 07/26. Pt with functional deficits admitted to CIR.  Meal completion has been 100%. Diarrhea improving. Plans for discharge tomorrow. Recommend continuation of carb modified diet at home. Pt encouraged to eat his food at meals. Labs and medications reviewed.   Diet Order:   Diet Order             Diet Carb Modified Fluid consistency: Thin; Room service appropriate? Yes  Diet effective now                   EDUCATION NEEDS:   Education needs have been addressed  Skin:  Skin Assessment: Reviewed RN Assessment Skin Integrity Issues:: Incisions, Stage II Stage II: L buttocks Incisions: L foot  Last BM:  8/16  Height:   Ht Readings from Last 1 Encounters:  08/18/20 $RemoveB'5\' 11"'[id number]$  (1.803 m)    Weight:   Wt Readings from Last 1 Encounters:  08/26/20 (!) 142.4 kg   BMI:  Body mass index is 43.79 kg/m.  Estimated Nutritional Needs:   Kcal:  2000-2200  Protein:  120-135 grams  Fluid:  >/= 2 L/day  Corrin Parker, MS, RD, LDN RD pager number/after hours weekend pager number on Amion.

## 2020-08-27 ENCOUNTER — Encounter: Payer: Self-pay | Admitting: Internal Medicine

## 2020-08-27 DIAGNOSIS — Z89432 Acquired absence of left foot: Secondary | ICD-10-CM | POA: Diagnosis not present

## 2020-08-27 NOTE — Discharge Summary (Signed)
Physician Discharge Summary  Patient ID: [name] MRN: [id number] DOB/AGE: [birth year] 65 y.o.  Admit date: 08/18/2020 Discharge date: 08/27/2020  Discharge Diagnoses:  Principal Problem:   Status post transmetatarsal amputation of left foot (HCC) Active Problems:   OSA (obstructive sleep apnea)   Hyponatremia   Chronic systolic congestive heart failure (HCC)   Prediabetes   Essential hypertension   Postoperative pain   Discharged Condition: good  Significant Diagnostic Studies: N/A   Labs:  Basic Metabolic Panel: BMP Latest Ref Rng & Units 08/24/2020 08/19/2020 08/18/2020  Glucose 70 - 99 mg/dL 141(H) 110(H) 116(H)  BUN 8 - 23 mg/dL '21 17 15  '$ Creatinine 0.61 - 1.24 mg/dL 1.05 0.91 1.01  BUN/Creat Ratio 10 - 24 - - -  Sodium 135 - 145 mmol/L 138 136 134(L)  Potassium 3.5 - 5.1 mmol/L 3.8 4.4 3.9  Chloride 98 - 111 mmol/L 102 100 98  CO2 22 - 32 mmol/L '28 28 28  '$ Calcium 8.9 - 10.3 mg/dL 9.5 9.2 9.0     CBC: CBC Latest Ref Rng & Units 08/24/2020 08/19/2020 08/15/2020  WBC 4.0 - 10.5 K/uL 6.4 9.7 10.1  Hemoglobin 13.0 - 17.0 g/dL 13.8 13.0 13.3  Hematocrit 39.0 - 52.0 % 43.6 39.7 41.5  Platelets 150 - 400 K/uL 291 280 345     CBG: Recent Labs  Lab 08/23/20 1701 08/23/20 2110 08/24/20 0610 08/24/20 1128 08/24/20 1637  GLUCAP 92 121* 112* 92 106*    Brief HPI:   [name] is a 65 y.o. male with history of follicular lymphoma, morbid obesity-BMI 55, A. fib, recent exacerbation of CHF, left great toe amputation was admitted on 08/04/2020 with acute osteomyelitis of left second and third toe with gangrenous changes of second toe.  He was started on IV vancomycin and underwent left transverse metatarsal amputation by [name] on the same day.  Postop to be NWB with CAM boot and IV antibiotics transition to Augmentin and doxycycline x2 weeks.  He was admitted to CIR on 08/30 for intensive rehab program due to functional decline.  He will was noted to have problems  maintaining NWB but also noted to have venous stasis dialysis on second and third toe dorsum.   [facility]VS was consulted for input and recommended angiogram to evaluate circulation.  He was discharged to acute floor on 08/05 abdominal aortogram with BLE runoff by Dr. Oneida Alar.  This was negative for flow-limiting and he has element of small vessel disease discharge segment occlusion distal anterior tibial dorsalis pedis on the left foot with posterior tibial artery flow.  Dr. Alfonse Spruce recommended amputation of second and third toe of right foot wounds did not heal of continue to deteriorate.  PT/OT evaluations were completed showing ongoing functional decline and he was cleared to resume his CIR program.    Hospital Course: [name] was admitted to rehab 08/18/2020 for inpatient therapies to consist of PT  and OT at least three hours five days a week. Past admission physiatrist, therapy team and rehab RN have worked together to provide customized collaborative inpatient rehab. Blood pressures were monitored on TID basis and has been well controlled.  He was noted to have bradycardia therefore Coreg was decreased to 3.125 mg twice daily.  He has been educated on carb modified diet as well as importance of blood sugar control for wound healing.  Diabetes has been monitored with ac/hs CBG checks and SSI was use prn for tighter BS control.  BS have been  reasonably controlled on Carb modified diet.  [name] with podiatry has followed up for input on wound and recommends daily dressing changes with Xeroform and dry dressing as well as elevation for edema control.  Respiratory status has been stable and weight is relatively stable around 316 pounds.  He completed his antibiotic course on 08/14.  Serial check of labs showed low Mg levels at 1.8 which was treated with IV magnesium. He did develop diarrhea with supplementation therefore this was not repeated.  FiberCon was added to help with loose stools.  Follow up CBC  showed that H/H is stable and BMET is WNL.  Protein supplement was added due to low albumin levels.  Follow-up check of electrolytes shows mild hyponatremia and renal status to be within normal limits.  Serial CBC shows H&H is stable and is without evidence of leukocytosis.  Pain control has been reasonable and hydrocodone was weaned down to once prn.  He has made good gains and currently requires supervision at wheelchair level.  He will continue to receive follow-up HHPT and HHOT by [facility] after discharge.    Rehab course: During patient's stay in rehab weekly team conferences were held to monitor patient's progress, set goals and discuss barriers to discharge. At admission, patient required mod assist with basic self-care tasks and with mobility. He has had improvement in activity tolerance, balance, postural control as well as ability to compensate for deficits. He is able to complete all bathing and dressing tasks at sink at modified independent leve. He requires min assist for transfers and for toileting tasks due to difficulty maintaining nonweightbearing status. He requires light min assist for balance/transfers is able to hop 5 to 10 feet with supervision.  Family education has been completed regarding all aspects of safety and care    Discharge disposition: 01-Home or Self Care  Wound care: Apply xerofrom with dry sterile dressing to left foot transmet amputation site.   Diet: Carb modified.   Special Instructions: No weight on left foot with DARCO shoe. [name] Kitchen   Discharge Instructions     Ambulatory referral to Physical Medicine Rehab   Complete by: As directed    Moderate complexity follow-up 1 to 2 weeks left transmetatarsal amputation      Allergies as of 08/27/2020       Reactions   Latex Hives, Swelling        Medication List     STOP taking these medications    allopurinol 300 MG tablet Commonly known as: ZYLOPRIM   amoxicillin-clavulanate 875-125 MG  tablet Commonly known as: Augmentin   [employer] Digestive Probiotic 250 MG capsule Generic drug: saccharomyces boulardii   doxycycline 100 MG tablet Commonly known as: VIBRA-TABS       TAKE these medications    acetaminophen 325 MG tablet Commonly known as: TYLENOL Take 650 mg by mouth every 6 (six) hours as needed for moderate pain.   albuterol 108 (90 Base) MCG/ACT inhaler Commonly known as: VENTOLIN HFA Inhale 2 puffs into the lungs every 6 (six) hours as needed for wheezing or shortness of breath.   apixaban 5 MG Tabs tablet Commonly known as: Eliquis Take 1 tablet (5 mg total) by mouth 2 (two) times daily. What changed: how much to take   ascorbic acid 500 MG tablet Commonly known as: VITAMIN C Take 1 tablet (500 mg total) by mouth 3 (three) times daily.   atorvastatin 20 MG tablet Commonly known as: LIPITOR TAKE 1 TABLET BY MOUTH EVERY  DAY *PT OVERDUE FOR OFFICE VISIT*   carvedilol 3.125 MG tablet Commonly known as: COREG Take 1 tablet (3.125 mg total) by mouth 2 (two) times daily. What changed:  medication strength how much to take   cholecalciferol 25 MCG (1000 UNIT) tablet Commonly known as: VITAMIN D Take 1 tablet (1,000 Units total) by mouth daily.   colchicine 0.6 MG tablet Take 1 tablet (0.6 mg total) by mouth 2 (two) times daily. What changed: when to take this   HYDROcodone-acetaminophen 10-325 MG tablet--Rx# 30 pills Commonly known as: Norco Take 1 tablet by mouth daily as needed for severe pain. What changed:  when to take this reasons to take this   Klor-Con M20 20 MEQ tablet Generic drug: potassium chloride SA Take 1 tablet (20 mEq total) by mouth daily. What changed: how much to take   losartan 50 MG tablet Commonly known as: COZAAR Take 1 tablet (50 mg total) by mouth daily. What changed: See the new instructions.   nitroGLYCERIN 0.1 mg/hr patch Commonly known as: NITRODUR - Dosed in mg/24 hr Place 1 patch (0.1 mg total) onto the  skin daily.   Omega 3 340 MG Cpdr Take 1 capsule (340 mg total) by mouth 2 (two) times daily.   omeprazole 40 MG capsule Commonly known as: PRILOSEC Take 1 capsule (40 mg total) by mouth daily.   polycarbophil 625 MG tablet Commonly known as: FIBERCON Take 1 tablet (625 mg total) by mouth daily.   sertraline 50 MG tablet Commonly known as: ZOLOFT Take 1 tablet (50 mg total) by mouth daily.   torsemide 20 MG tablet Commonly known as: DEMADEX Take 1 tablet (20 mg total) by mouth daily. What changed:  how much to take how to take this when to take this additional instructions   vitamin B-12 1000 MCG tablet Commonly known as: CYANOCOBALAMIN Take 1 tablet (1,000 mcg total) by mouth daily.        Follow-up Information     Raulkar, Clide Deutscher, MD Follow up.   Specialty: Physical Medicine and Rehabilitation Why: 9/26 11:20am for [place] patches Contact information: [id number] [place] [address] [phone]         Trula Slade, DPM. Call.   Specialty: Podiatry Why: for follow up appointment Contact information: [address] [place] [address] [phone]         Waynetta Sandy, MD Follow up.   Specialties: Vascular Surgery, Cardiology Contact information: [address] [place] [address] [phone]         Ginger Organ., MD. Call.   Specialty: Internal Medicine Why: for post hospital follow up Contact information: [place] [address] [phone]         Minus Breeding, MD .   Specialty: Cardiology Contact information: [address] [place] [address] [phone]                 Signed: Bary Leriche 08/29/2020, 9:15 PM

## 2020-08-27 NOTE — Progress Notes (Signed)
INPATIENT REHABILITATION DISCHARGE NOTE   Discharge instructions by: Linna Hoff Pa  Verbalized understanding:yes  Skin care/Wound care:done; dressing changed this morning   Pain:none  IV's:none  Tubes/Drains:none  Safety instructions: done  Patient belongings:done  Discharged [id number]   Discharged [id number]  Notes:

## 2020-08-27 NOTE — Progress Notes (Signed)
PROGRESS NOTE   Subjective/Complaints: No complaints this morning Ready for d/c today Discussed plan for f/u Has not yet received wheelchair from [place].   ROS:  Denies CP, shortness of breath, nausea, vomiting, pain.   Objective:   No results found. No results for input(s): WBC, HGB, HCT, PLT in the last 72 hours.  No results for input(s): NA, K, CL, CO2, GLUCOSE, BUN, CREATININE, CALCIUM in the last 72 hours.   Intake/Output Summary (Last 24 hours) at 08/27/2020 1026 Last data filed at 08/27/2020 0825 Gross per 24 hour  Intake 600 ml  Output 600 ml  Net 0 ml         Physical Exam: Vital Signs Blood pressure 140/85, pulse 61, temperature 97.7 F (36.5 C), temperature source Oral, resp. rate 16, height '5\' 11"'$  (1.803 m), weight (!) 142.4 kg, SpO2 98 %. Gen: no distress, normal appearing HEENT: oral mucosa pink and moist, NCAT Cardio: Reg rate Chest: normal effort, normal rate of breathing Abd: soft, non-distended Ext: no edema Psych: pleasant, normal affect Skin: Warm and dry.  Left foot with dressing CDI. Psych: Normal mood.  Normal behavior. Musc: Lower extreme edema.  No tenderness in extremities. Neuro: Alert and oriented Motor: Grossly intact, some limitations due to pain and dressing.  Assessment/Plan: 1. Functional deficits which require 3+ hours per day of interdisciplinary therapy in a comprehensive inpatient rehab setting. Physiatrist is providing close team supervision and 24 hour management of active medical problems listed below. Physiatrist and rehab team continue to assess barriers to discharge/monitor patient progress toward functional and medical goals  Care Tool:  Bathing    Body parts bathed by patient: Right arm, Chest, Abdomen, Left arm, Face   Body parts bathed by helper: Right lower leg Body parts n/a: Left lower leg   Bathing assist Assist Level: Independent with assistive  device Assistive Device Comment: w/c   Upper Body Dressing/Undressing Upper body dressing   What is the patient wearing?: Pull over shirt    Upper body assist Assist Level: Independent with assistive device Assistive Device Comment: w/c propulsion for ADL item retrieval  Lower Body Dressing/Undressing Lower body dressing      What is the patient wearing?: Pants     Lower body assist Assist for lower body dressing: Independent with assitive device     Toileting Toileting    Toileting assist Assist for toileting: Independent with assistive device Assistive Device Comment: urinal   Transfers Chair/bed transfer  Transfers assist     Chair/bed transfer assist level: Minimal Assistance - Patient > 75% Chair/bed transfer assistive device: Programmer, multimedia   Ambulation assist   Ambulation activity did not occur: Safety/medical concerns (unable to comply with NWB precautions)  Assist level: Minimal Assistance - Patient > 75% Assistive device: Walker-rolling Max distance: 67f   Walk 10 feet activity   Assist  Walk 10 feet activity did not occur: Safety/medical concerns        Walk 50 feet activity   Assist Walk 50 feet with 2 turns activity did not occur: Safety/medical concerns         Walk 150 feet activity   Assist Walk 150  feet activity did not occur: Safety/medical concerns         Walk 10 feet on uneven surface  activity   Assist Walk 10 feet on uneven surfaces activity did not occur: Safety/medical concerns         Wheelchair     Assist Will patient use wheelchair at discharge?: Yes Type of Wheelchair: Manual    Wheelchair assist level: Supervision/Verbal cueing Max wheelchair distance: 150'    Wheelchair 50 feet with 2 turns activity    Assist        Assist Level: Supervision/Verbal cueing   Wheelchair 150 feet activity     Assist      Assist Level: Supervision/Verbal cueing   Blood  pressure 140/85, pulse 61, temperature 97.7 F (36.5 C), temperature source Oral, resp. rate 16, height '5\' 11"'$  (1.803 m), weight (!) 142.4 kg, SpO2 98 %.  Medical Problem List and Plan: 1.  S/p transmetatarsal amputation of left foot D/c home today 2.  Antithrombotics: -DVT/anticoagulation:  Pharmaceutical: Other (comment)--eliquis             -antiplatelet therapy: N/A 3. Postoperative pain: Discontinue hydrocodone 4. Mood: LCSW to follow for evaluation and support.             -antipsychotic agents: N/A 5. Neuropsych: This patient is capable of making decisions on his own behalf. 6. Skin/Wound Care: Daily dressing changes to left foot. Pressure relief measures             --will order nystatin powder for candidal rash inguinal areas.  Continue Gerhardt  7. Fluids/Electrolytes/Nutrition: Monitor I/Os. 8. Pre-diabetes: Hgb A1c-6.4 and discussed treating as new onset DM             --monitor BS ac/ch. CM diet  -recommended against Ensure due to its added sugar- discussed alternative foods that are good for wound healing  D/c ISS and CBGs.  9.  Chronic systolic CHF/HTN: Compensated. Will monitor weights daily. Low salt diet. --continue Demadex, Lipitor, Coreg and Losartan. Blood pressure is well controlled on 8/16, monitor for trend Filed Weights   08/24/20 0500 08/25/20 0444 08/26/20 0500  Weight: (!) 143.6 kg (!) 139 kg (!) 142.4 kg  10. Hypoalbuminemia: Added juven bid to promote wound healing. 11. Gout flare: Started on colchicine on 08/09 12.  Osteomyelitis left toes s/p amputation: To continue Augmentin/doxycycline for 2 weeks--end date 08/14. Vitamin D level ordered.  13. Hyponatremia: resolved, repeat outpatient.  14. A fib: Monitor HR tid--continue coreg and eliquis. Magnesium level is 1.8. Supplement 2gm IV on 8/12. Will not supplement further given diarrhea.  15. Peripheral arterial disease: continue nitrodur 0.'1mg'$  transdermal patch on left tibia. Discussed possible side  effects to look out for- he did not experience any. Discussed eating pomegranate and beets daily to naturally increase nitric oxide production, and breathing techniques.  16. Urinary frequency with Torsemide: changed to with lunch as per his request. Improved, continue at this time.  17. Bradycardia: decrease Coreg to 3.'125mg'$  BID. Discussed with patient. Continue with this lower dose.  18.  Diarrhea  Likely secondary to antibiotics  Continue FiberCon, improving    >30 minutes spent in discharge of patient including review of medications and follow-up appointments, physical examination, and in answering all patient's questions   LOS: 9 days A FACE TO FACE EVALUATION WAS [place] 08/27/2020, 10:26 AM

## 2020-08-31 ENCOUNTER — Other Ambulatory Visit: Payer: Self-pay

## 2020-08-31 ENCOUNTER — Encounter: Payer: Self-pay | Admitting: Podiatry

## 2020-08-31 ENCOUNTER — Ambulatory Visit (INDEPENDENT_AMBULATORY_CARE_PROVIDER_SITE_OTHER): Payer: Medicare Other | Admitting: Podiatry

## 2020-08-31 DIAGNOSIS — Z9889 Other specified postprocedural states: Secondary | ICD-10-CM

## 2020-08-31 DIAGNOSIS — L97522 Non-pressure chronic ulcer of other part of left foot with fat layer exposed: Secondary | ICD-10-CM

## 2020-08-31 DIAGNOSIS — L97511 Non-pressure chronic ulcer of other part of right foot limited to breakdown of skin: Secondary | ICD-10-CM

## 2020-09-01 DIAGNOSIS — E1151 Type 2 diabetes mellitus with diabetic peripheral angiopathy without gangrene: Secondary | ICD-10-CM | POA: Diagnosis not present

## 2020-09-01 DIAGNOSIS — I4891 Unspecified atrial fibrillation: Secondary | ICD-10-CM | POA: Diagnosis not present

## 2020-09-01 DIAGNOSIS — C8290 Follicular lymphoma, unspecified, unspecified site: Secondary | ICD-10-CM | POA: Diagnosis not present

## 2020-09-01 DIAGNOSIS — I5023 Acute on chronic systolic (congestive) heart failure: Secondary | ICD-10-CM | POA: Diagnosis not present

## 2020-09-01 DIAGNOSIS — D126 Benign neoplasm of colon, unspecified: Secondary | ICD-10-CM | POA: Diagnosis not present

## 2020-09-01 DIAGNOSIS — M170 Bilateral primary osteoarthritis of knee: Secondary | ICD-10-CM | POA: Diagnosis not present

## 2020-09-01 DIAGNOSIS — E785 Hyperlipidemia, unspecified: Secondary | ICD-10-CM | POA: Diagnosis not present

## 2020-09-01 DIAGNOSIS — I272 Pulmonary hypertension, unspecified: Secondary | ICD-10-CM | POA: Diagnosis not present

## 2020-09-01 DIAGNOSIS — K648 Other hemorrhoids: Secondary | ICD-10-CM | POA: Diagnosis not present

## 2020-09-01 DIAGNOSIS — Z89432 Acquired absence of left foot: Secondary | ICD-10-CM | POA: Diagnosis not present

## 2020-09-01 DIAGNOSIS — M160 Bilateral primary osteoarthritis of hip: Secondary | ICD-10-CM | POA: Diagnosis not present

## 2020-09-01 DIAGNOSIS — M109 Gout, unspecified: Secondary | ICD-10-CM | POA: Diagnosis not present

## 2020-09-01 DIAGNOSIS — I429 Cardiomyopathy, unspecified: Secondary | ICD-10-CM | POA: Diagnosis not present

## 2020-09-01 DIAGNOSIS — M19072 Primary osteoarthritis, left ankle and foot: Secondary | ICD-10-CM | POA: Diagnosis not present

## 2020-09-01 DIAGNOSIS — I110 Hypertensive heart disease with heart failure: Secondary | ICD-10-CM | POA: Diagnosis not present

## 2020-09-01 DIAGNOSIS — Z4781 Encounter for orthopedic aftercare following surgical amputation: Secondary | ICD-10-CM | POA: Diagnosis not present

## 2020-09-02 ENCOUNTER — Telehealth: Payer: Self-pay

## 2020-09-02 NOTE — Telephone Encounter (Signed)
error 

## 2020-09-02 NOTE — Telephone Encounter (Signed)
Mr. [name] said he was given Metformin in the hospital. But was not given an script at discharge. Does he need to be on  Metformin?  If so  please send a Rx.   Call back phone [phone].

## 2020-09-03 ENCOUNTER — Telehealth: Payer: Self-pay | Admitting: *Deleted

## 2020-09-03 NOTE — Telephone Encounter (Signed)
Shree PT [facility] called for approval of POC 1wk1.0wk1,1wk1,0wk1, then 1wk5. He is asking for a HHRN to go out because of a blister on the pateint's heel and another wound that is in the healing process. Approval given.

## 2020-09-04 NOTE — Telephone Encounter (Signed)
At home CBG reading have been between 90-130.

## 2020-09-07 ENCOUNTER — Other Ambulatory Visit: Payer: Self-pay | Admitting: Physical Medicine and Rehabilitation

## 2020-09-07 MED ORDER — METFORMIN HCL 500 MG PO TABS: 500.0000 mg | ORAL_TABLET | Freq: Every day | ORAL | 11 refills | Status: DC

## 2020-09-07 NOTE — Progress Notes (Signed)
Subjective: [name] is a 65 y.o. is seen today in office s/p left transmetatarsal amputation preformed on 08/05/2020.  States he been doing well.  Contrast left foot is much as possible.  He is back home after pain and rehab at the hospital.  Denies any fevers or chills.  States the wound on the right foot been doing well.  No other concerns.    Objective: General: No acute distress, AAOx3  DP/PT pulses palpable 2/4, CRT < 3 sec to all digits.  Left foot: Incision is well coapted without any evidence of dehisce with sutures, staples intact.  There is no surrounding erythema, ascending cellulitis, fluctuance, crepitus, malodor, drainage/purulence. There is mild edema around the surgical site. There is no pain along the surgical site.  Right foot: Superficial area skin present second third toes without any probing to monitor tunneling.  There is no surrounding erythema, ascending cellulitis.  No fluctuance or crepitation.  No malodor. No other areas of tenderness to bilateral lower extremities.  No other open lesions or pre-ulcerative lesions.  No pain with calf compression, swelling, warmth, erythema.   Assessment and Plan:  Status post left foot surgery, doing well with no complications   -Treatment options discussed including all alternatives, risks, and complications -I remove the sutures today as well as a few staples without any complications the incision is well coapted.  Nonadherent dressing was applied followed by a dry sterile dressing.  Encouraged elevation, nonweightbearing. -Monitor for any clinical signs or symptoms of infection and DVT/PE and directed to call the office immediately should any occur or go to the ER. -Follow-up as scheduled or sooner if any problems arise. In the meantime, encouraged to call the office with any questions, concerns, change in symptoms.   Celesta Gentile, DPM

## 2020-09-08 ENCOUNTER — Ambulatory Visit (INDEPENDENT_AMBULATORY_CARE_PROVIDER_SITE_OTHER): Payer: Medicare Other | Admitting: Podiatry

## 2020-09-08 ENCOUNTER — Other Ambulatory Visit: Payer: Self-pay

## 2020-09-08 ENCOUNTER — Encounter: Payer: Self-pay | Admitting: Podiatry

## 2020-09-08 DIAGNOSIS — Z9889 Other specified postprocedural states: Secondary | ICD-10-CM

## 2020-09-08 DIAGNOSIS — L97522 Non-pressure chronic ulcer of other part of left foot with fat layer exposed: Secondary | ICD-10-CM | POA: Diagnosis not present

## 2020-09-08 NOTE — Telephone Encounter (Signed)
Patient informed. 

## 2020-09-15 ENCOUNTER — Telehealth: Payer: Self-pay | Admitting: Physical Medicine and Rehabilitation

## 2020-09-15 NOTE — Telephone Encounter (Signed)
Sonia Side from [facility] wants [place] approval for  RN visits  her number [phone]

## 2020-09-15 NOTE — Progress Notes (Signed)
Subjective: [name] is a 65 y.o. is seen today in office s/p left transmetatarsal amputation preformed on 08/05/2020.  States he been doing well and he hopes that the staples are coming out today his family's been changing the bandages daily.  He has not tried to stay off the foot is much as possible.  No fevers or chills.  No other concerns.  Objective: General: No acute distress, AAOx3  DP/PT pulses palpable 2/4, CRT < 3 sec to all digits.  Left foot: Incision is well coapted without any evidence of dehisce with staples intact.  There is no surrounding erythema, ascending cellulitis.  There is no fluctuation crepitation.  There is no malodor.  Mild edema to the foot but there is no erythema.  No other areas of skin breakdown on the left side. Right foot: Superficial area skin present second third toes without any probing to monitor tunneling.  Pressure in with a scab today.  There is no surrounding erythema, ascending cellulitis.  No fluctuance or crepitation.  No malodor. No other areas of tenderness to bilateral lower extremities.  No other open lesions or pre-ulcerative lesions.  No pain with calf compression, swelling, warmth, erythema.   Assessment and Plan:  Status post left foot surgery, doing well with no complications   -Treatment options discussed including all alternatives, risks, and complications -I remove The staples today.  Incision is well coapted.  There is a scab along the incision before who could come off on its own.  Continue daily dressing changes.  Nonweightbearing. -Monitor for any clinical signs or symptoms of infection and DVT/PE and directed to call the office immediately should any occur or go to the ER. -Follow-up as scheduled for further staple removal or sooner if any problems arise. In the meantime, encouraged to call the office with any questions, concerns, change in symptoms.   Celesta Gentile, DPM

## 2020-09-16 ENCOUNTER — Other Ambulatory Visit: Payer: Self-pay

## 2020-09-16 ENCOUNTER — Ambulatory Visit (INDEPENDENT_AMBULATORY_CARE_PROVIDER_SITE_OTHER): Payer: Medicare Other | Admitting: Podiatry

## 2020-09-16 DIAGNOSIS — Z9889 Other specified postprocedural states: Secondary | ICD-10-CM

## 2020-09-16 NOTE — Progress Notes (Signed)
Patient in office today to have remaining staples removed. Staples removed without complications and patient tolerated it well. Patient denies nausea, vomiting, fever and chills. Wound redressed with DSD at this time.

## 2020-09-22 ENCOUNTER — Other Ambulatory Visit: Payer: Self-pay

## 2020-09-22 ENCOUNTER — Ambulatory Visit (INDEPENDENT_AMBULATORY_CARE_PROVIDER_SITE_OTHER): Payer: Medicare Other | Admitting: Podiatry

## 2020-09-22 DIAGNOSIS — Z9889 Other specified postprocedural states: Secondary | ICD-10-CM

## 2020-09-22 DIAGNOSIS — L97522 Non-pressure chronic ulcer of other part of left foot with fat layer exposed: Secondary | ICD-10-CM

## 2020-09-22 DIAGNOSIS — G4733 Obstructive sleep apnea (adult) (pediatric): Secondary | ICD-10-CM | POA: Diagnosis not present

## 2020-09-23 ENCOUNTER — Telehealth: Payer: Self-pay | Admitting: Podiatry

## 2020-09-23 NOTE — Telephone Encounter (Signed)
Patient was seen yesterday and stated he gave a nurse/or assistant some insurance paperwork that needed to be filled out last week. He forgot to ask if they had been completed. He wanted to make sure Dr. Jacqualyn Posey did receive them. He wanted to have his sister pick them up. Please advise

## 2020-09-27 DIAGNOSIS — I429 Cardiomyopathy, unspecified: Secondary | ICD-10-CM | POA: Diagnosis not present

## 2020-09-27 DIAGNOSIS — Z89432 Acquired absence of left foot: Secondary | ICD-10-CM | POA: Diagnosis not present

## 2020-09-27 DIAGNOSIS — I5022 Chronic systolic (congestive) heart failure: Secondary | ICD-10-CM | POA: Diagnosis not present

## 2020-09-27 DIAGNOSIS — E119 Type 2 diabetes mellitus without complications: Secondary | ICD-10-CM | POA: Diagnosis not present

## 2020-09-27 NOTE — Progress Notes (Signed)
Subjective: [name] is a 65 y.o. is seen today in office s/p left transmetatarsal amputation preformed on 08/05/2020.  States he has been doing well.  No increase in swelling but is remained stable.  No drainage or pus.  Bandage is changed daily.  He has no new concerns today.   Objective: General: No acute distress, AAOx3  DP/PT pulses palpable 2/4, CRT < 3 sec to all digits.  Left foot: Incision is well coapted without any evidence of dehisce and the sutures, staples have been removed.  On the central aspect dorsally there is a scab present.  There is no drainage or pus.  There is no fluctuation crepitation.  Able to remove some of the loose scab today and underlying skin has not completely healed. Right foot: Superficial area skin present second toe.  Otherwise other wound appears to be healed.  No edema, erythema or signs of infection.   No pain with calf compression, swelling, warmth, erythema.   Assessment and Plan:  Status post left foot surgery, healing  -Treatment options discussed including all alternatives, risks, and complications -Regards to the right foot wound to continue with dressing changes with silver collagen.  Continue nonweightbearing, elevation. -Right foot is healing well continue with daily dressing changes. -Monitor for any clinical signs or symptoms of infection and directed to call the office immediately should any occur or go to the ER.  Return in about 1 week (around 09/29/2020).  Trula Slade DPM

## 2020-09-28 ENCOUNTER — Encounter (HOSPITAL_COMMUNITY): Payer: Medicare Other

## 2020-09-28 DIAGNOSIS — E1152 Type 2 diabetes mellitus with diabetic peripheral angiopathy with gangrene: Secondary | ICD-10-CM | POA: Diagnosis not present

## 2020-09-28 DIAGNOSIS — S98132A Complete traumatic amputation of one left lesser toe, initial encounter: Secondary | ICD-10-CM | POA: Diagnosis not present

## 2020-09-28 DIAGNOSIS — L97529 Non-pressure chronic ulcer of other part of left foot with unspecified severity: Secondary | ICD-10-CM | POA: Diagnosis not present

## 2020-09-29 ENCOUNTER — Ambulatory Visit (INDEPENDENT_AMBULATORY_CARE_PROVIDER_SITE_OTHER): Payer: Medicare Other | Admitting: Podiatry

## 2020-09-29 ENCOUNTER — Encounter: Payer: Self-pay | Admitting: Podiatry

## 2020-09-29 ENCOUNTER — Other Ambulatory Visit: Payer: Self-pay

## 2020-09-29 DIAGNOSIS — Z9889 Other specified postprocedural states: Secondary | ICD-10-CM

## 2020-09-29 DIAGNOSIS — L97522 Non-pressure chronic ulcer of other part of left foot with fat layer exposed: Secondary | ICD-10-CM

## 2020-09-30 ENCOUNTER — Telehealth: Payer: Self-pay | Admitting: *Deleted

## 2020-09-30 DIAGNOSIS — L97522 Non-pressure chronic ulcer of other part of left foot with fat layer exposed: Secondary | ICD-10-CM | POA: Diagnosis not present

## 2020-09-30 NOTE — Telephone Encounter (Signed)
Sent request for supplies to [facility] today. [name]

## 2020-10-05 ENCOUNTER — Encounter
Payer: Medicare Other | Attending: Physical Medicine and Rehabilitation | Admitting: Physical Medicine and Rehabilitation

## 2020-10-05 ENCOUNTER — Other Ambulatory Visit: Payer: Self-pay

## 2020-10-05 ENCOUNTER — Ambulatory Visit (INDEPENDENT_AMBULATORY_CARE_PROVIDER_SITE_OTHER): Payer: Medicare Other | Admitting: Podiatry

## 2020-10-05 VITALS — BP 103/60 | HR 62 | Temp 98.3°F

## 2020-10-05 DIAGNOSIS — I739 Peripheral vascular disease, unspecified: Secondary | ICD-10-CM | POA: Insufficient documentation

## 2020-10-05 DIAGNOSIS — Z89432 Acquired absence of left foot: Secondary | ICD-10-CM | POA: Diagnosis not present

## 2020-10-05 DIAGNOSIS — Z9889 Other specified postprocedural states: Secondary | ICD-10-CM

## 2020-10-05 DIAGNOSIS — L97522 Non-pressure chronic ulcer of other part of left foot with fat layer exposed: Secondary | ICD-10-CM

## 2020-10-05 MED ORDER — FREESTYLE LIBRE 2 SENSOR MISC: 1.0000 | Freq: Every day | 3 refills | Status: DC

## 2020-10-05 MED ORDER — FREESTYLE LIBRE 2 READER DEVI: 1.0000 | Freq: Every day | 3 refills | Status: DC

## 2020-10-05 NOTE — Progress Notes (Addendum)
Subjective:    Patient ID: [name], male    DOB: 28-Jun-1955, 65 y.o.   MRN: [id number]  HPI [name] is a 65 year old who presents for hospital follow-up after CIR admission for transmetatarsal amputation.  He has been doing well at home.  Has no complaints today  Pain is 0/10  Does not require any refills  CBGs have been low 100s to 1teens- he has a glucometer at home  He continues to wear the nitrolgycerin patch without side effects of headaches   Still not cleared for weightbearing by [name] and not yet cleared to return to work.   Pain Inventory Average Pain 0 Pain Right Now 0 My pain is  no pain  In the last 24 hours, has pain interfered with the following? General activity 0 Relation with others 0 Enjoyment of life 0 What TIME of day is your pain at its worst? No pain Sleep (in general) Good  Pain is worse with:  no pain Pain improves with:  no pain Relief from Meds:  no pain  Family History  Problem Relation Age of Onset   Dementia Brother 61       frontal lobe    Dementia Mother    Arthritis Mother    Hypertension Mother    CVA Sister 81   Colon cancer Neg Hx    Pancreatic cancer Neg Hx    Stomach cancer Neg Hx    Rectal cancer Neg Hx    Liver cancer Neg Hx    Social History   Socioeconomic History   Marital status: Divorced    Spouse name: Not on file   Number of children: Not on file   Years of education: Not on file   Highest education level: Not on file  Occupational History   Occupation: cosmetologist  Tobacco Use   Smoking status: Former    Packs/day: 0.50    Years: 20.00    Pack years: 10.00    Types: Cigarettes    Quit date: 01/10/2009    Years since quitting: 11.7   Smokeless tobacco: Never  Vaping Use   Vaping Use: Never used  Substance and Sexual Activity   Alcohol use: Not Currently    Comment: 2-3 times per week   Drug use: No   Sexual activity: Not on file  Other Topics Concern   Not on file  Social History  Narrative   Not on file   Social Determinants of Health   Financial Resource Strain: Not on file  Food Insecurity: Not on file  Transportation Needs: Not on file  Physical Activity: Not on file  Stress: Not on file  Social Connections: Not on file   Past Surgical History:  Procedure Laterality Date   ABDOMINAL AORTOGRAM W/LOWER EXTREMITY N/A 08/14/2020   Procedure: ABDOMINAL AORTOGRAM W/LOWER EXTREMITY;  Surgeon: Elam Dutch, MD;  Location: [facility];  Service: Cardiovascular;  Laterality: N/A;   AMPUTATION TOE Left 05/08/2019   Procedure: AMPUTATION LEFT GREAT  TOE;  Surgeon: Trula Slade, DPM;  Location: [facility];  Service: Podiatry;  Laterality: Left;   BRONCHOSCOPY     ESOPHAGOGASTRODUODENOSCOPY ENDOSCOPY     Dr Royden Purl HERNIA REPAIR Left    2001   IR REMOVAL TUN ACCESS W/ PORT W/O FL MOD SED  02/03/2020   PORTA CATH INSERTION  2011   TRACHEOSTOMY  03/2017   with decannulation   TRANSMETATARSAL AMPUTATION Left 08/05/2020  Procedure: TRANSMETATARSAL AMPUTATION;  Surgeon: Trula Slade, DPM;  Location: [place] [facility];  Service: Podiatry;  Laterality: Left;   Past Surgical History:  Procedure Laterality Date   ABDOMINAL AORTOGRAM W/LOWER EXTREMITY N/A 08/14/2020   Procedure: ABDOMINAL AORTOGRAM W/LOWER EXTREMITY;  Surgeon: Elam Dutch, MD;  Location: [facility];  Service: Cardiovascular;  Laterality: N/A;   AMPUTATION TOE Left 05/08/2019   Procedure: AMPUTATION LEFT GREAT  TOE;  Surgeon: Trula Slade, DPM;  Location: [facility];  Service: Podiatry;  Laterality: Left;   BRONCHOSCOPY     ESOPHAGOGASTRODUODENOSCOPY ENDOSCOPY     Dr Royden Purl HERNIA REPAIR Left    2001   IR REMOVAL TUN ACCESS W/ PORT W/O FL MOD SED  02/03/2020   PORTA CATH INSERTION  2011   TRACHEOSTOMY  03/2017   with decannulation   TRANSMETATARSAL AMPUTATION Left 08/05/2020   Procedure: TRANSMETATARSAL AMPUTATION;  Surgeon: Trula Slade, DPM;  Location: [place];  Service: Podiatry;  Laterality: Left;   Past Medical History:  Diagnosis Date   Acute systolic HF (heart failure) (HCC)    Arthritis    lt foot, hips knees    Atrial fibrillation (HCC)    Diabetes mellitus, new onset ([place])    Diverticulosis    Dyslipidemia    Gout attack 01/16/2012   Hypertension    Internal hemorrhoids    Lymphoma ([place])    remission for about 2 years, chemo 3 years prior   Pneumonia due to COVID-19 virus 07/2018   Pulmonary hypertension ([place])    Sepsis ([place]) 02/2017   secondary to influenza; requiring trach   Tubular adenoma of colon    Venous stasis ulcers (HCC)    BP 103/60   Pulse 62   Temp 98.3 F (36.8 C) (Oral)   SpO2 96%   Opioid Risk Score:   Fall Risk Score:  `1  Depression screen PHQ 2/9  Depression screen PHQ 2/9 10/05/2020  Decreased Interest 0  Down, Depressed, Hopeless 0  PHQ - 2 Score 0  Some recent data might be hidden      Review of Systems     Objective:   Physical Exam Gen: no distress, normal appearing HEENT: oral mucosa pink and moist, NCAT Cardio: Reg rate Chest: normal effort, normal rate of breathing Abd: soft, non-distended Ext: no edema Psych: pleasant, normal affect Skin: intact Neuro: Alert and oriented x3 Musculoskeletal: Left foot dressing C/D/I     Assessment & Plan:  [name] is a 65 year old man who presents for hospital followup after CIR admission for transmetatarsal amputation.   1) Transmetatarsal amputation -Hold off on therapy until he can bear weight on foot -continue handicap placard -discussed return to work -continue nitroglycerin patch. Discussed mechanism of action. Discussed foods that contain nitric oxide, benefits of breathing techniques and exercise in increasing nitric oxide production  2) Type 2 DM -discussed risks and benefits of metformin, continue -check Cr with PCP -provided with script for [facility] 2 sensor and reader and discussed benefits of continuous glucose  monitor -try to incorporate into your diet some of the following foods which are good for diabetes: 1) cinnamon- imitates effects of insulin, increasing glucose transport into cells ([place] or [place] cinnamon is best, least processed) 2) nuts- can slow down the blood sugar response of carbohydrate rich foods 3) oatmeal- contains and anti-inflammatory compound avenanthramide 4) whole-milk yogurt (best types are no sugar, [place] yogurt, or goat/sheep yogurt) 5) beans- high in  protein, fiber, and vitamins, low glycemic index 6) broccoli- great source of vitamin A and C 7) quinoa- higher in protein and fiber than other grains 8) spinach- high in vitamin A, fiber, and protein 9) olive oil- reduces glucose levels, LDL, and triglycerides 10) salmon- excellent amount of omega-3-fatty acids 11) walnuts- rich in antioxidants 12) apples- high in fiber and quercetin 13) carrots- highly nutritious with low impact on blood sugar 14) eggs- improve HDL (good cholesterol), high in protein, keep you satiated 15) turmeric: improves blood sugars, cardiovascular disease, and protects kidney health 16) garlic: improves blood sugar, blood pressure, pain 17) tomatoes: highly nutritious with low impact on blood sugar

## 2020-10-06 ENCOUNTER — Telehealth: Payer: Self-pay | Admitting: *Deleted

## 2020-10-06 NOTE — Telephone Encounter (Signed)
Physical Therapist w/ [facility] is calling for the weight bearing status ,restrictions for wearing the boot since his last appointment. Please advise, call [phone])

## 2020-10-06 NOTE — Progress Notes (Signed)
Subjective: [name] is a 65 y.o. is seen today in office s/p left transmetatarsal amputation preformed on 08/05/2020.  States he has been doing well.  The dressing does get changed daily.  No purulence.  Small mount of bloody drainage.  No ankle swelling or redness.  He denies any fevers or chills.  He is still in the Darco wedge shoe and limiting his weightbearing and not been walking much.  Objective: General: No acute distress, AAOx3  DP/PT pulses palpable 2/4, CRT < 3 sec to all digits.  Left foot: Incision is well coapted without any evidence of dehisce.  On the dorsal aspect of the central portion of the incision is a scab present with a superficial area of skin breakdown.  There is no probing, undermining or tunneling.  Mild edema still evident but there is no erythema, ascending cellulitis there is no fluctuation crepitation but there is no malodor.   Right foot: Appears the wounds of healed on the right foot.  Arthritic changes present secondary.  This is been a chronic issue. No pain with calf compression, swelling, warmth, erythema.   Assessment and Plan:  Status post left foot surgery, healing  -Treatment options discussed including all alternatives, risks, and complications -Regards to the left foot wound to continue with dressing changes with silver collagen.  Continue nonweightbearing, elevation.  Debrided some of the loose hyperkeratotic tissue/scab today. -Right foot is healing well continue with daily dressing changes. -Monitor for any clinical signs or symptoms of infection and directed to call the office immediately should any occur or go to the ER.  Return in about 1 week  Trula Slade DPM

## 2020-10-07 NOTE — Telephone Encounter (Signed)
Returned call to OT(Jim) ,no answer, left vmessage to call back for doctor's recommendations.

## 2020-10-07 NOTE — Telephone Encounter (Signed)
Returned the call to OT(Jim) giving recommendations per Dr Jacqualyn Posey, verbalized understanding but wanted to know if this is for 10 minutes at a time for WB in the darco wedge shoe?Please advise.

## 2020-10-08 ENCOUNTER — Telehealth: Payer: Self-pay | Admitting: Physical Medicine and Rehabilitation

## 2020-10-08 DIAGNOSIS — M109 Gout, unspecified: Secondary | ICD-10-CM | POA: Diagnosis not present

## 2020-10-08 DIAGNOSIS — D126 Benign neoplasm of colon, unspecified: Secondary | ICD-10-CM | POA: Diagnosis not present

## 2020-10-08 DIAGNOSIS — Z4781 Encounter for orthopedic aftercare following surgical amputation: Secondary | ICD-10-CM | POA: Diagnosis not present

## 2020-10-08 DIAGNOSIS — M160 Bilateral primary osteoarthritis of hip: Secondary | ICD-10-CM | POA: Diagnosis not present

## 2020-10-08 DIAGNOSIS — K648 Other hemorrhoids: Secondary | ICD-10-CM | POA: Diagnosis not present

## 2020-10-08 DIAGNOSIS — I110 Hypertensive heart disease with heart failure: Secondary | ICD-10-CM | POA: Diagnosis not present

## 2020-10-08 DIAGNOSIS — I429 Cardiomyopathy, unspecified: Secondary | ICD-10-CM | POA: Diagnosis not present

## 2020-10-08 DIAGNOSIS — M19072 Primary osteoarthritis, left ankle and foot: Secondary | ICD-10-CM | POA: Diagnosis not present

## 2020-10-08 DIAGNOSIS — E1151 Type 2 diabetes mellitus with diabetic peripheral angiopathy without gangrene: Secondary | ICD-10-CM | POA: Diagnosis not present

## 2020-10-08 DIAGNOSIS — E785 Hyperlipidemia, unspecified: Secondary | ICD-10-CM | POA: Diagnosis not present

## 2020-10-08 DIAGNOSIS — I4891 Unspecified atrial fibrillation: Secondary | ICD-10-CM | POA: Diagnosis not present

## 2020-10-08 DIAGNOSIS — I272 Pulmonary hypertension, unspecified: Secondary | ICD-10-CM | POA: Diagnosis not present

## 2020-10-08 DIAGNOSIS — M170 Bilateral primary osteoarthritis of knee: Secondary | ICD-10-CM | POA: Diagnosis not present

## 2020-10-08 DIAGNOSIS — C8290 Follicular lymphoma, unspecified, unspecified site: Secondary | ICD-10-CM | POA: Diagnosis not present

## 2020-10-08 DIAGNOSIS — I5023 Acute on chronic systolic (congestive) heart failure: Secondary | ICD-10-CM | POA: Diagnosis not present

## 2020-10-08 DIAGNOSIS — Z89432 Acquired absence of left foot: Secondary | ICD-10-CM | POA: Diagnosis not present

## 2020-10-08 NOTE — Telephone Encounter (Signed)
Approval given

## 2020-10-08 NOTE — Telephone Encounter (Signed)
Herbert Deaner PT with [facility] needs to get verbal orders to see patient 2w3.  Please call him at [phone].

## 2020-10-09 NOTE — Progress Notes (Signed)
Subjective: [name] is a 65 y.o. is seen today in office s/p left transmetatarsal amputation preformed on 08/05/2020.  States he has been doing well.  They are concerned about the silver collagen pad as when he removes it some skin comes off with it.  He denies any drainage or pus except for small mount of bloody drainage at times.  There is no increase in swelling or redness and actually the swelling has improved.  He has no pain.     Objective: General: No acute distress, AAOx3  Left foot: Incision is well coapted along the central aspect of the incision there are scabbed areas which are loose.  Able debride these and there are 3 superficial area skin breakdown there is no probing, undermining or tunneling.  There is no surrounding erythema, ascending cellulitis.  No fluctuation or crepitation.  There is no malodor. On the right foot the wounds appear to be healed on the digits. No pain with calf compression, swelling, warmth, erythema.   Assessment and Plan:  Status post left foot surgery, healing  -Treatment options discussed including all alternatives, risks, and complications -I was able to debride some loose scab, hyperkeratotic tissue to reveal the underlying superficial wounds.  Continue with daily dressing changes.  Discussed with him continue Darco wedge shoe but he can start it becomes more active although limited.  Discussed he can start steps but he does not work with physical therapy prior to this.  Encouraged elevation. Monitor for any clinical signs or symptoms of infection and directed to call the office immediately should any occur or go to the ER.   Trula Slade DPM

## 2020-10-12 ENCOUNTER — Ambulatory Visit (INDEPENDENT_AMBULATORY_CARE_PROVIDER_SITE_OTHER): Payer: Medicare Other | Admitting: Podiatry

## 2020-10-12 ENCOUNTER — Other Ambulatory Visit: Payer: Self-pay

## 2020-10-12 DIAGNOSIS — M109 Gout, unspecified: Secondary | ICD-10-CM | POA: Diagnosis not present

## 2020-10-12 DIAGNOSIS — E785 Hyperlipidemia, unspecified: Secondary | ICD-10-CM | POA: Diagnosis not present

## 2020-10-12 DIAGNOSIS — E1152 Type 2 diabetes mellitus with diabetic peripheral angiopathy with gangrene: Secondary | ICD-10-CM | POA: Diagnosis not present

## 2020-10-12 DIAGNOSIS — Z9889 Other specified postprocedural states: Secondary | ICD-10-CM

## 2020-10-12 DIAGNOSIS — L97522 Non-pressure chronic ulcer of other part of left foot with fat layer exposed: Secondary | ICD-10-CM

## 2020-10-12 DIAGNOSIS — Z125 Encounter for screening for malignant neoplasm of prostate: Secondary | ICD-10-CM | POA: Diagnosis not present

## 2020-10-14 NOTE — Progress Notes (Signed)
Subjective: [name] is a 65 y.o. is seen today in office s/p left transmetatarsal amputation preformed on 08/05/2020.  States has been doing well.  The dressing does get changed daily.  They recently silver collagen again but discussed that irritation is given using a Betadine wet-to-dry which seems to be doing better.  Did not see any drainage or pus.  No increase in swelling or redness.  No fevers or chills.  He has become somewhat more active and doing steps and on his feet 10 minutes out of every hour.  No fevers or chills.  No other concerns.  Objective: General: No acute distress, AAOx3  Left foot: Incision is well coapted along the central aspect of the incision there is one scalloped area.  Just lateral to this area there is an area of skin breakdown that was present previously but appears to be smaller.  There is no probing to bone, undermining or tunneling.  No exposed bone or tendon.  Fibrotic wound base prior to debridement after debridement the wound base is granular.  No edema, erythema, drainage or pus or any signs of infection.  No fluctuation or crepitation.  There is no malodor. On the right foot the wounds appear to be healed on the digits. No pain with calf compression, swelling, warmth, erythema.      Assessment and Plan:  Status post left foot surgery, healing  -Treatment options discussed including all alternatives, risks, and complications -Sharply debrided the wound today utilizing #312 blade scalpel down to healthy, bleeding tissue.  He seems be doing better with the Betadine wet-to-dry she will continue with this and also applied this today.  Continue to elevate and were not to change his activity level at this point.  Continue to monitor any signs or symptoms of infection or any worsening let me know immediately.  Trula Slade DPM

## 2020-10-16 DIAGNOSIS — Z0000 Encounter for general adult medical examination without abnormal findings: Secondary | ICD-10-CM | POA: Diagnosis not present

## 2020-10-16 DIAGNOSIS — Z23 Encounter for immunization: Secondary | ICD-10-CM | POA: Diagnosis not present

## 2020-10-16 DIAGNOSIS — I2510 Atherosclerotic heart disease of native coronary artery without angina pectoris: Secondary | ICD-10-CM | POA: Diagnosis not present

## 2020-10-16 DIAGNOSIS — R82998 Other abnormal findings in urine: Secondary | ICD-10-CM | POA: Diagnosis not present

## 2020-10-16 DIAGNOSIS — I10 Essential (primary) hypertension: Secondary | ICD-10-CM | POA: Diagnosis not present

## 2020-10-16 DIAGNOSIS — E1151 Type 2 diabetes mellitus with diabetic peripheral angiopathy without gangrene: Secondary | ICD-10-CM | POA: Diagnosis not present

## 2020-10-18 NOTE — Progress Notes (Deleted)
Cardiology Clinic Note   Patient Name: [name] Date of Encounter: 10/18/2020  Primary Care Provider:  Ginger Organ., MD Primary Cardiologist:  Minus Breeding, MD  Patient Profile    [name] 65 year old male presents to the clinic today for follow-up evaluation of his chronic systolic CHF and permanent atrial fibrillation.  Past Medical History    Past Medical History:  Diagnosis Date   Acute systolic HF (heart failure) (HCC)    Arthritis    lt foot, hips knees    Atrial fibrillation (HCC)    Diabetes mellitus, new onset ([place])    Diverticulosis    Dyslipidemia    Gout attack 01/16/2012   Hypertension    Internal hemorrhoids    Lymphoma ([place])    remission for about 2 years, chemo 3 years prior   Pneumonia due to COVID-19 virus 07/2018   Pulmonary hypertension ([place])    Sepsis ([place]) 02/2017   secondary to influenza; requiring trach   Tubular adenoma of colon    Venous stasis ulcers ([place])    Past Surgical History:  Procedure Laterality Date   ABDOMINAL AORTOGRAM W/LOWER EXTREMITY N/A 08/14/2020   Procedure: ABDOMINAL AORTOGRAM W/LOWER EXTREMITY;  Surgeon: Elam Dutch, MD;  Location: [facility];  Service: Cardiovascular;  Laterality: N/A;   AMPUTATION TOE Left 05/08/2019   Procedure: AMPUTATION LEFT GREAT  TOE;  Surgeon: Trula Slade, DPM;  Location: [facility];  Service: Podiatry;  Laterality: Left;   BRONCHOSCOPY     ESOPHAGOGASTRODUODENOSCOPY ENDOSCOPY     Dr Royden Purl HERNIA REPAIR Left    2001   IR REMOVAL TUN ACCESS W/ PORT W/O FL MOD SED  02/03/2020   PORTA CATH INSERTION  2011   TRACHEOSTOMY  03/2017   with decannulation   TRANSMETATARSAL AMPUTATION Left 08/05/2020   Procedure: TRANSMETATARSAL AMPUTATION;  Surgeon: Trula Slade, DPM;  Location: [facility];  Service: Podiatry;  Laterality: Left;    Allergies  Allergies  Allergen Reactions   Latex Hives and Swelling    History of Present Illness    [name] is a  PMH of permanent atrial fibrillation, OSA, obesity, chronic HFrEF, chronic venous stasis, and lymphoma.  He was diagnosed with NH lymphoma in 2012 and completed chemotherapy treatment.  He was admitted 2/19 with acute respiratory failure.  He reported 3 weeks of worsening shortness of breath.  On arrival to the hospital he was found to be hypoxic in the 80s and placed on BiPAP.  He was treated with nebulizers and Solu-Medrol.  He was found to be in new onset atrial fibrillation with RVR.  His chest x-ray showed multi focal pneumonia.  He was placed on broad-spectrum antibiotics.  His respiratory virus panel was positive for influenza.  He developed ARDS secondary to influenza pneumonia and required intubation.  He failed repeat extubations which resulted in tracheostomy.  He underwent a month of aggressive voiding trials which resulted in him being decannulated.  His echocardiogram showed moderate LVH, EF 25-30%, and diffuse hypokinesis.  He was seen by Dr. Percival Spanish post hospitalization.  He was continued on losartan and carvedilol.  He reported compliance with his CPAP.  His follow-up echocardiogram 7/19 showed an EF of 50-55%.  He continued to follow-up with cardiology and his repeat echocardiogram 11/20 showed an EF of 45-50%.  He had been admitted to the hospital on 6/22 with heart failure and symptoms of fluid volume overload.  His BNP was elevated  to 900 and his creatinine was noted to be stable.  His EKG at that time showed acute ischemia and his troponins were flat.  His chest x-ray showed pulmonary edema.  He responded well to IV diuresis and improvement was noted with his symptoms.  He required 2 further doses of IV Lasix.  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  He did not follow-up with outpatient TOC chronic.  Several weeks later he again presented to the [facility] emergency department with similar symptoms.  He was enrolled in the heart  failure at home program and given IV diuresis which produced good response.  He was seen by Dr. Shan Levans on 7/22.  During that time his at home weight was around 334 pounds.  His previous weight was around 348 pounds.  He reported some low heart rates and slight headache the previous Saturday morning.  Saturday and Sunday he started taking 40 mg of torsemide daily.  He reported good urine output.  His weight decreased from 341 pounds down to 334 pounds.  He denied chest pain.  He was sleeping well using his CPAP and denied orthopnea.  He denied PND.  He was doing a better job monitoring his sodium intake in his diet.  He still reported shortness of breath with exertion.  He presents the clinic today for follow-up evaluation states***  *** denies chest pain, shortness of breath, lower extremity edema, fatigue, palpitations, melena, hematuria, hemoptysis, diaphoresis, weakness, presyncope, syncope, orthopnea, and PND.  Home Medications    Prior to Admission medications   Medication Sig Start Date End Date Taking? Authorizing Provider  acetaminophen (TYLENOL) 325 MG tablet Take 650 mg by mouth every 6 (six) hours as needed for moderate pain.     [provider]  albuterol (VENTOLIN HFA) 108 (90 Base) MCG/ACT inhaler Inhale 2 puffs into the lungs every 6 (six) hours as needed for wheezing or shortness of breath. 08/26/20   Angiulli, Lavon Paganini, PA-C  apixaban (ELIQUIS) 5 MG TABS tablet Take 1 tablet (5 mg total) by mouth 2 (two) times daily. 08/26/20   Angiulli, Lavon Paganini, PA-C  ascorbic acid (VITAMIN C) 500 MG tablet Take 1 tablet (500 mg total) by mouth 3 (three) times daily. 08/26/20   Angiulli, Lavon Paganini, PA-C  atorvastatin (LIPITOR) 20 MG tablet TAKE 1 TABLET BY MOUTH EVERY DAY *PT OVERDUE FOR OFFICE VISIT* 08/26/20   Angiulli, Lavon Paganini, PA-C  cholecalciferol (VITAMIN D) 25 MCG (1000 UNIT) tablet Take 1 tablet (1,000 Units total) by mouth daily. 08/26/20   Angiulli, Lavon Paganini, PA-C  clindamycin  (CLEOCIN) 300 MG capsule Take 300 mg by mouth 3 (three) times daily. 08/07/20   [provider]  colchicine 0.6 MG tablet Take 1 tablet (0.6 mg total) by mouth 2 (two) times daily. 08/26/20   Angiulli, Lavon Paganini, PA-C  Continuous Blood Gluc Receiver (FREESTYLE LIBRE 2 READER) DEVI 1 each by Does not apply route daily. 10/05/20   Raulkar, Clide Deutscher, MD  Continuous Blood Gluc Sensor (FREESTYLE LIBRE 2 SENSOR) MISC 1 each by Does not apply route daily. 10/05/20   Raulkar, Clide Deutscher, MD  KLOR-CON M20 20 MEQ tablet Take 1 tablet (20 mEq total) by mouth daily. 08/26/20   Angiulli, Lavon Paganini, PA-C  losartan (COZAAR) 50 MG tablet Take 1 tablet (50 mg total) by mouth daily. 08/26/20   Angiulli, Lavon Paganini, PA-C  metFORMIN (GLUCOPHAGE) 500 MG tablet Take 1 tablet (500 mg  total) by mouth daily with breakfast. 09/07/20 09/07/21  Raulkar, Clide Deutscher, MD  nitroGLYCERIN (NITRODUR - DOSED IN MG/24 HR) 0.1 mg/hr patch Place 1 patch (0.1 mg total) onto the skin daily. 08/26/20   Angiulli, Lavon Paganini, PA-C  Omega 3 340 MG CPDR Take 1 capsule (340 mg total) by mouth 2 (two) times daily. 08/26/20   Angiulli, Lavon Paganini, PA-C  omeprazole (PRILOSEC) 40 MG capsule Take 1 capsule (40 mg total) by mouth daily. 08/26/20   Angiulli, Lavon Paganini, PA-C  polycarbophil (FIBERCON) 625 MG tablet Take 1 tablet (625 mg total) by mouth daily. 08/26/20   Angiulli, Lavon Paganini, PA-C  sertraline (ZOLOFT) 50 MG tablet Take 1 tablet (50 mg total) by mouth daily. 08/26/20   Angiulli, Lavon Paganini, PA-C  torsemide (DEMADEX) 20 MG tablet Take 1 tablet (20 mg total) by mouth daily. 08/26/20   Angiulli, Lavon Paganini, PA-C  vitamin B-12 (CYANOCOBALAMIN) 1000 MCG tablet Take 1 tablet (1,000 mcg total) by mouth daily. 08/26/20   Angiulli, Lavon Paganini, PA-C  carvedilol (COREG) 6.25 MG tablet Take 6.25 mg by mouth 2 (two) times daily. 09/13/20 10/05/20  [provider]  sodium chloride (OCEAN) 0.65 % SOLN nasal spray Place 2 sprays into both nostrils as needed for congestion.  04/05/17 10/05/20  Bary Leriche, PA-C    Family History    Family History  Problem Relation Age of Onset   Dementia Brother 87       frontal lobe    Dementia Mother    Arthritis Mother    Hypertension Mother    CVA Sister 52   Colon cancer Neg Hx    Pancreatic cancer Neg Hx    Stomach cancer Neg Hx    Rectal cancer Neg Hx    Liver cancer Neg Hx    He indicated that his mother is deceased. He indicated that his father is deceased. He indicated that the status of his sister is unknown. He indicated that his brother is alive. He indicated that his maternal grandmother is deceased. He indicated that his maternal grandfather is deceased. He indicated that his paternal grandmother is deceased. He indicated that his paternal grandfather is deceased. He indicated that the status of his neg hx is unknown.  Social History    Social History   Socioeconomic History   Marital status: Divorced    Spouse name: Not on file   Number of children: Not on file   Years of education: Not on file   Highest education level: Not on file  Occupational History   Occupation: cosmetologist  Tobacco Use   Smoking status: Former    Packs/day: 0.50    Years: 20.00    Pack years: 10.00    Types: Cigarettes    Quit date: 01/10/2009    Years since quitting: 11.7   Smokeless tobacco: Never  Vaping Use   Vaping Use: Never used  Substance and Sexual Activity   Alcohol use: Not Currently    Comment: 2-3 times per week   Drug use: No   Sexual activity: Not on file  Other Topics Concern   Not on file  Social History Narrative   Not on file   Social Determinants of Health   Financial Resource Strain: Not on file  Food Insecurity: Not on file  Transportation Needs: Not on file  Physical Activity: Not on file  Stress: Not on file  Social Connections: Not on file  Intimate Partner Violence: Not on file  Review of Systems    General:  No chills, fever, night sweats or weight changes.   Cardiovascular:  No chest pain, dyspnea on exertion, edema, orthopnea, palpitations, paroxysmal nocturnal dyspnea. Dermatological: No rash, lesions/masses Respiratory: No cough, dyspnea Urologic: No hematuria, dysuria Abdominal:   No nausea, vomiting, diarrhea, bright red blood per rectum, melena, or hematemesis Neurologic:  No visual changes, wkns, changes in mental status. All other systems reviewed and are otherwise negative except as noted above.  Physical Exam    VS:  There were no vitals taken for this visit. , BMI There is no height or weight on file to calculate BMI. GEN: Well nourished, well developed, in no acute distress. HEENT: normal. Neck: Supple, no JVD, carotid bruits, or masses. Cardiac: RRR, no murmurs, rubs, or gallops. No clubbing, cyanosis, edema.  Radials/DP/PT 2+ and equal bilaterally.  Respiratory:  Respirations regular and unlabored, clear to auscultation bilaterally. GI: Soft, nontender, nondistended, BS + x 4. MS: no deformity or atrophy. Skin: warm and dry, no rash. Neuro:  Strength and sensation are intact. Psych: Normal affect.  Accessory Clinical Findings    Recent Labs: 07/27/2020: B Natriuretic Peptide 381.6 08/19/2020: ALT 28 08/24/2020: BUN 21; Creatinine, Ser 1.05; Hemoglobin 13.8; Magnesium 1.8; Platelets 291; Potassium 3.8; Sodium 138   Recent Lipid Panel    Component Value Date/Time   TRIG 122 02/26/2017 1306    ECG personally reviewed by me today- *** - No acute changes  EKG 08/14/2020 Atrial fibrillation with slow ventricular response right bundle branch block inferior infarct undetermined age, anterolateral infarct undetermined age 77 bpm  Echocardiogram11/30/2020  IMPRESSIONS     1. Technically difficult study. Left ventricular ejection fraction, by  visual estimation, is grossly 45 to 50%. There appears to be mildly  decreased global systolic function. Images are not sufficient to assess  for regional wall motion abnormalities.   There is mildly increased left ventricular hypertrophy.   2. Left ventricular diastolic parameters are indeterminate.   3. Global right ventricle has mildly reduced systolic function.The right  ventricular size is mildly enlarged.   4. Left atrial size was mildly dilated.   5. Right atrial size was normal.   6. The mitral valve is normal in structure. Trace mitral valve  regurgitation.   7. The tricuspid valve is not well visualized. Tricuspid valve  regurgitation is trivial.   8. The aortic valve is tricuspid. Aortic valve regurgitation is not  visualized.   9. The pulmonic valve was not well visualized. Pulmonic valve  regurgitation is not visualized.  10. There is dilatation of the aortic root measuring 40 mm.  11. The inferior vena cava is dilated in size with <50% respiratory  variability, suggesting right atrial pressure of 15 mmHg.  12. The tricuspid regurgitant velocity is 2.79 m/s, and with an assumed  right atrial pressure of 15 mmHg, the estimated right ventricular systolic  pressure is moderately elevated at 46.1 mmHg.   Assessment & Plan   1.  Chronic systolic CHF-no increased DOE or activity intolerance.  Echocardiogram 2/19 showed newly diagnosed atrial fibrillation with an EF of 25-30%.  Echocardiogram 7/19 showed EF 50-55%.  Echocardiogram 11/20 showed EF 45-50%.  Recommendation to avoid SGLT2 inhibitor due to ESBL UTI 2019. Continue carvedilol, losartan, torsemide Heart healthy low-sodium diet-salty 6 given Increase physical activity as tolerated Repeat echocardiogram  Cardiomyopathy-no increased work of breathing today or activity intolerance.  Weight  down to 365 pounds.  Breathing improved after increasing furosemide. Continue torsemide, carvedilol, losartan  Heart healthy low-sodium diet-salty 6 given Increase physical activity as tolerated Continue fluid restriction 48 ounces daily Daily weights-contact office with weight gain of 3 pounds overnight or 5  pounds in a week  Atrial fibrillation-heart rate today***.  Cardiac unaware.  Denies episodes of presyncope or syncope.  Reports compliance with apixaban and denies bleeding issues.  Bleeding precautions reviewed. Continue carvedilol, apixaban Heart healthy low-sodium diet-salty 6 given Increase physical activity as tolerated  Disposition: Follow-up with Dr. Percival Spanish or me in 3-4 months.  Jossie Ng. Dequavion Follette NP-C    10/18/2020, 4:37 PM [address] Office [phone] Fax [phone]  Notice: This dictation was prepared with Dragon dictation along with smaller phrase technology. Any transcriptional errors that result from this process are unintentional and may not be corrected upon review.  I spent***minutes examining this patient, reviewing medications, and using patient centered shared decision making involving her cardiac care.  Prior to her visit I spent greater than 20 minutes reviewing her past medical history,  medications, and prior cardiac tests.

## 2020-10-19 ENCOUNTER — Telehealth: Payer: Self-pay

## 2020-10-19 ENCOUNTER — Other Ambulatory Visit: Payer: Self-pay

## 2020-10-19 ENCOUNTER — Other Ambulatory Visit: Payer: Self-pay | Admitting: Internal Medicine

## 2020-10-19 ENCOUNTER — Ambulatory Visit: Payer: Medicare Other | Admitting: General Practice

## 2020-10-19 DIAGNOSIS — F17201 Nicotine dependence, unspecified, in remission: Secondary | ICD-10-CM

## 2020-10-19 DIAGNOSIS — Z0000 Encounter for general adult medical examination without abnormal findings: Secondary | ICD-10-CM

## 2020-10-19 NOTE — Telephone Encounter (Signed)
Patient was NOT late.he was not arrived in time and rescheduled.  Patient already paid a CO Pay and was not seen.  DO not charge another copay on the new appt.  10-19-20  VB

## 2020-10-20 ENCOUNTER — Ambulatory Visit (INDEPENDENT_AMBULATORY_CARE_PROVIDER_SITE_OTHER): Payer: Medicare Other | Admitting: Podiatry

## 2020-10-20 ENCOUNTER — Encounter: Payer: Self-pay | Admitting: Podiatry

## 2020-10-20 ENCOUNTER — Telehealth: Payer: Self-pay | Admitting: Internal Medicine

## 2020-10-20 DIAGNOSIS — L97522 Non-pressure chronic ulcer of other part of left foot with fat layer exposed: Secondary | ICD-10-CM

## 2020-10-20 DIAGNOSIS — H5213 Myopia, bilateral: Secondary | ICD-10-CM | POA: Diagnosis not present

## 2020-10-20 DIAGNOSIS — Z9889 Other specified postprocedural states: Secondary | ICD-10-CM

## 2020-10-20 NOTE — Telephone Encounter (Signed)
Reschedule for 12/28, message was left w pt

## 2020-10-21 ENCOUNTER — Telehealth: Payer: Self-pay

## 2020-10-21 ENCOUNTER — Other Ambulatory Visit: Payer: Self-pay | Admitting: Internal Medicine

## 2020-10-21 DIAGNOSIS — F17201 Nicotine dependence, unspecified, in remission: Secondary | ICD-10-CM

## 2020-10-21 NOTE — Telephone Encounter (Signed)
Herbert Deaner, PT from [facility] called to inform that [name] is being discharged from [facility]. He stated the patient will be following up with [name] weekly for surgical site evaluation and will be restarting work next week.

## 2020-10-22 DIAGNOSIS — G4733 Obstructive sleep apnea (adult) (pediatric): Secondary | ICD-10-CM | POA: Diagnosis not present

## 2020-10-24 NOTE — Progress Notes (Signed)
Subjective: [name] is a 65 y.o. is seen today in office s/p left transmetatarsal amputation preformed on 08/05/2020.  He states that he has been doing better.  They are not sure if they can drain or not but they started putting just a dry bandage on it and has been doing better.  No significant drainage today with pinhole pus that they have noticed.  No increase in swelling or redness.  He has no pain although he does have neuropathy.  No fevers or chills.  He has been somewhat more active around the house although limited.  He is eager to get back to work for part-time basis.  Objective: General: No acute distress, AAOx3  Left foot: Incision is well coapted along the central aspect of the incision there is still one central area that is open that improved compared to last visit.  There is no probing to bone, undermining or tunneling.  No exposed bone or tendon.  There is no surrounding erythema, ascending cellulitis.  No fluctuance or crepitation.  There is no malodor. The wounds on the right toes have healed.  There are some dry skin which I debrided today and there is no underlying ulcerations or signs of infection.  Arthritic changes present to the digits. No pain with calf compression, swelling, warmth, erythema.   Assessment and Plan:  Status post left foot surgery, healing  -Treatment options discussed including all alternatives, risks, and complications -Sharply debrided the wound today utilizing #312 blade scalpel down to healthy, bleeding tissue.  The wound has continued to improve.  Continue with daily dressing changes, offloading.  He is still limiting nighttime when he is on his feet.  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  Discussed with him that he needs to keep a very close eye on this because if there should be any worsening he is at risk of limb loss.  He understands this and he  has friends and family that help take care of him and dress the wounds and they are also can keep a close eye on this.  Monitoring signs or symptoms of infection. -Debrided some loose tissue on the right lesser digits without complications.  No significant open lesions.  Discussed changing socks regularly, drying thoroughly between the toes to help with any moisture.  Trula Slade DPM

## 2020-10-26 ENCOUNTER — Other Ambulatory Visit: Payer: Self-pay

## 2020-10-26 ENCOUNTER — Ambulatory Visit (INDEPENDENT_AMBULATORY_CARE_PROVIDER_SITE_OTHER): Payer: Medicare Other | Admitting: Podiatry

## 2020-10-26 DIAGNOSIS — L97522 Non-pressure chronic ulcer of other part of left foot with fat layer exposed: Secondary | ICD-10-CM

## 2020-10-26 DIAGNOSIS — Z9889 Other specified postprocedural states: Secondary | ICD-10-CM

## 2020-10-27 ENCOUNTER — Encounter: Payer: Medicare Other | Admitting: Podiatry

## 2020-10-27 DIAGNOSIS — I5022 Chronic systolic (congestive) heart failure: Secondary | ICD-10-CM | POA: Diagnosis not present

## 2020-10-27 DIAGNOSIS — Z89432 Acquired absence of left foot: Secondary | ICD-10-CM | POA: Diagnosis not present

## 2020-10-27 DIAGNOSIS — E119 Type 2 diabetes mellitus without complications: Secondary | ICD-10-CM | POA: Diagnosis not present

## 2020-10-27 DIAGNOSIS — I429 Cardiomyopathy, unspecified: Secondary | ICD-10-CM | POA: Diagnosis not present

## 2020-11-02 ENCOUNTER — Ambulatory Visit (INDEPENDENT_AMBULATORY_CARE_PROVIDER_SITE_OTHER): Payer: Medicare Other | Admitting: Podiatry

## 2020-11-02 ENCOUNTER — Encounter: Payer: Self-pay | Admitting: Podiatry

## 2020-11-02 ENCOUNTER — Other Ambulatory Visit: Payer: Self-pay

## 2020-11-02 DIAGNOSIS — L97522 Non-pressure chronic ulcer of other part of left foot with fat layer exposed: Secondary | ICD-10-CM

## 2020-11-02 DIAGNOSIS — Z9889 Other specified postprocedural states: Secondary | ICD-10-CM

## 2020-11-03 NOTE — Progress Notes (Signed)
Subjective: [name] is a 65 y.o. is seen today in office s/p left transmetatarsal amputation preformed on 08/05/2020.  States he is doing well.  Returned to work today on a short-term part-time basis.  He states he did well without any issues.  No increase in swelling.  No fever chills.  Dressing has been changed with him to keep a dry bandage on it without significant drainage.  Occasional bloody drainage but no pus.  Denies any fevers or chills.  No other concerns.   Objective: General: No acute distress, AAOx3  Left foot: Incision is well coapted along the central aspect of the incision there is still one central area that is open that improved compared to last visit.  After debridement still small fibrotic tissue present with some granulation tissue.  There is no exposed bone or tendon.  Clinically appears to be improving.  No increase in edema. On the right foot the wounds are healed. No pain with calf compression, swelling, warmth, erythema.   Assessment and Plan:  Status post left foot surgery, healing  -Treatment options discussed including all alternatives, risks, and complications -Sharply debrided the wound today utilizing #312 blade scalpel down to healthy, bleeding tissue.  The wound has continued to improve.  Continue with daily dressing changes.  He can wash with soap and water.  Continue compression, elevation.  Monitor the foot very closely for any worsening if there is any changes he is not to work and let me know immediately.   -Monitor for any clinical signs or symptoms of infection and directed to call the office immediately should any occur or go to the ER.  Return in about 1 week (around 11/02/2020).  Trula Slade DPM

## 2020-11-05 NOTE — Progress Notes (Signed)
Subjective: [name] is a 65 y.o. is seen today in office s/p left transmetatarsal amputation preformed on 08/05/2020 is continue to work part-time and he states he been doing well with this and the foot has not been swelling.  They been keeping a dry bandage on the wound and change the dressing daily.  Slight bloody drainage but no purulence.  No increasing redness or warmth.  No fevers or chills.  He has no other concerns.  Objective: General: No acute distress, AAOx3  Left foot: I dehiscence still noted along the central aspect of the incision.  Appears to be more superficial today.  There is some fibroglandular tissue.  The picture is prior to debridement.  After debridement the wound base is more granular.  There is no probing to bone, undermining or tunneling.  No exposed bone or tendon. On the right foot is able to debride a small mount of loose tissue of the second digit but there is no ulcerations identified.  No edema, erythema. No pain with calf compression, swelling, warmth, erythema.      Assessment and Plan:  Status post left foot surgery, healing  -Treatment options discussed including all alternatives, risks, and complications -Sharply debrided the wound today utilizing #312 blade scalpel down to healthy, bleeding tissue.  The wound has continued to improve.  Continue with daily dressing changes.  He can wash with soap and water.  Continue compression, elevation.  Monitor the foot very closely for any worsening if there is any changes he is not to work and let me know immediately.   -Debrided hyperkeratotic tissue right second toe with any complications or bleeding. -Monitor for any clinical signs or symptoms of infection and directed to call the office immediately should any occur or go to the ER.  Return in about 1 week  Trula Slade DPM

## 2020-11-09 ENCOUNTER — Encounter: Payer: Self-pay | Admitting: Physician Assistant

## 2020-11-09 ENCOUNTER — Encounter: Payer: Self-pay | Admitting: Podiatry

## 2020-11-09 ENCOUNTER — Encounter: Payer: Medicare Other | Admitting: Physician Assistant

## 2020-11-09 ENCOUNTER — Ambulatory Visit (INDEPENDENT_AMBULATORY_CARE_PROVIDER_SITE_OTHER): Payer: Medicare Other | Admitting: Podiatry

## 2020-11-09 ENCOUNTER — Other Ambulatory Visit: Payer: Self-pay

## 2020-11-09 DIAGNOSIS — L97522 Non-pressure chronic ulcer of other part of left foot with fat layer exposed: Secondary | ICD-10-CM

## 2020-11-09 DIAGNOSIS — Z9889 Other specified postprocedural states: Secondary | ICD-10-CM

## 2020-11-09 NOTE — Patient Instructions (Signed)
Pt left before being seen

## 2020-11-09 NOTE — Progress Notes (Signed)
  This encounter was created in error - please disregard.  Patient left due to prolonged wait time.

## 2020-11-11 NOTE — Progress Notes (Signed)
Subjective: [name] is a 65 y.o. is seen today in office s/p left transmetatarsal amputation preformed on 08/05/2020 is continue to work part-time and he states he been doing well with this and the foot has not been swelling.  Still been changing the bandage daily.  Minimal bloody drainage but no pus.  No increase in swelling or redness.  Denies any fevers or chills.  He has no other concerns.   Objective: General: No acute distress, AAOx3  Left foot: I dehiscence still noted along the central aspect of the incision.  Fibroglandular wound base prior to debridement.  See picture below.  After debridement granular wound base is present with bleeding edges.  No probing to bone, exposed bone or tendon.  No edema, erythema or signs of infection.  No fluctuation or crepitation but there is no malodor.  Wounds are healed on the right foot. No pain with calf compression, swelling, warmth, erythema.       Assessment and Plan:  Status post left foot surgery, healing  -Treatment options discussed including all alternatives, risks, and complications -Sharply debrided the wound today utilizing #312 blade scalpel down to healthy, bleeding tissue.  The wound has continued to improve, although slowly.  Continue with daily dressing changes.  He can wash with soap and water.  Continue compression, elevation.  Monitor the foot very closely for any worsening if there is any changes he is not to work and let me know immediately.   -Monitor for any clinical signs or symptoms of infection and directed to call the office immediately should any occur or go to the ER.  Trula Slade DPM

## 2020-11-13 NOTE — Addendum Note (Signed)
Addended by: Angelena Form on: 11/13/2020 01:15 PM   Modules accepted: Orders

## 2020-11-16 NOTE — Addendum Note (Signed)
Addended by: Angelena Form on: 11/16/2020 04:36 PM   Modules accepted: Orders

## 2020-11-16 NOTE — Addendum Note (Signed)
Addended by: Angelena Form on: 11/16/2020 04:37 PM   Modules accepted: Orders

## 2020-11-18 DIAGNOSIS — H524 Presbyopia: Secondary | ICD-10-CM | POA: Diagnosis not present

## 2020-11-22 DIAGNOSIS — G4733 Obstructive sleep apnea (adult) (pediatric): Secondary | ICD-10-CM | POA: Diagnosis not present

## 2020-11-23 ENCOUNTER — Ambulatory Visit (INDEPENDENT_AMBULATORY_CARE_PROVIDER_SITE_OTHER): Payer: Medicare Other | Admitting: Podiatry

## 2020-11-23 ENCOUNTER — Other Ambulatory Visit: Payer: Self-pay

## 2020-11-23 DIAGNOSIS — L97522 Non-pressure chronic ulcer of other part of left foot with fat layer exposed: Secondary | ICD-10-CM

## 2020-11-25 NOTE — Progress Notes (Signed)
Subjective: KHIYAN CRACE is a 65 y.o. is seen today in office s/p left transmetatarsal amputation preformed on 08/05/2020 is continue to work part-time and he states he been doing well with this and the foot has not been swelling.  He states the wound is doing better.  No purulence.  Amount of bloody drainage at times.  Denies any increase in swelling or redness of his foot.  No fevers or chills.  Has no other concerns.   Objective: General: No acute distress, AAOx3  Left foot: I dehiscence still noted along the central aspect of the incision.  It does appear to be more superficial and smaller today.  There is no probing to bone, undermining or tunneling.  Exposed bone or tendon.  There is no fluctuation crepitation.  There is no surrounding erythema, ascending cellulitis. No pain with calf compression, swelling, warmth, erythema.   Assessment and Plan:  Status post left foot surgery, healing although slowly  -Treatment options discussed including all alternatives, risks, and complications -Sharply debrided the wound today utilizing #312 blade scalpel down to healthy, bleeding tissue.  The wound has continued to improve, although slowly.  Continue with daily dressing changes.  Dry dressings applied today.  He can wash with soap and water.  Continue compression, elevation.  Monitor the foot very closely for any worsening if there is any changes he is not to work and let me know immediately.   -Monitor for any clinical signs or symptoms of infection and directed to call the office immediately should any occur or go to the ER.  Trula Slade DPM

## 2020-11-27 DIAGNOSIS — E119 Type 2 diabetes mellitus without complications: Secondary | ICD-10-CM | POA: Diagnosis not present

## 2020-11-27 DIAGNOSIS — Z89432 Acquired absence of left foot: Secondary | ICD-10-CM | POA: Diagnosis not present

## 2020-11-27 DIAGNOSIS — I429 Cardiomyopathy, unspecified: Secondary | ICD-10-CM | POA: Diagnosis not present

## 2020-11-27 DIAGNOSIS — I5022 Chronic systolic (congestive) heart failure: Secondary | ICD-10-CM | POA: Diagnosis not present

## 2020-12-07 ENCOUNTER — Other Ambulatory Visit: Payer: Self-pay

## 2020-12-07 ENCOUNTER — Ambulatory Visit (INDEPENDENT_AMBULATORY_CARE_PROVIDER_SITE_OTHER): Payer: Medicare Other | Admitting: Podiatry

## 2020-12-07 DIAGNOSIS — Z9889 Other specified postprocedural states: Secondary | ICD-10-CM

## 2020-12-07 DIAGNOSIS — L97522 Non-pressure chronic ulcer of other part of left foot with fat layer exposed: Secondary | ICD-10-CM

## 2020-12-09 NOTE — Progress Notes (Signed)
Subjective: [name] is a 65 y.o. is seen today in office s/p left transmetatarsal amputation preformed on 08/05/2020 is continue to work part-time and he states he been doing well with this and the foot has not been swelling.  Continues to need to the bandage daily with a dry dressing.  Thinks it is doing well.  No significant drainage or any purulence.  No increase in swelling or redness.  Denies any fevers or chills.  He has no other concerns.   Objective: General: No acute distress, AAOx3  Left foot: On the central aspect there is still dehiscence but is more superficial today and smaller.  Granular wound base there is no probing, amount or tunneling.  No exposed bone or tendon.  There is no fluctuation or crepitation.  There is no malodor. No pain with calf compression, swelling, warmth, erythema.   Assessment and Plan:  Status post left foot surgery, healing although slowly  -Treatment options discussed including all alternatives, risks, and complications -Sharply debrided the wound today utilizing #312 blade scalpel down to healthy, bleeding tissue.  The wound has continued to improve, although slowly but today is more superficial and smaller and clinically looks better.  Continue with daily dressing changes.  Dry dressings applied today.  He can wash with soap and water.  Continue compression, elevation.  Monitor the foot very closely for any worsening if there is any changes he is not to work and let me know immediately.   -Monitor for any clinical signs or symptoms of infection and directed to call the office immediately should any occur or go to the ER.  Trula Slade DPM

## 2020-12-21 ENCOUNTER — Ambulatory Visit (INDEPENDENT_AMBULATORY_CARE_PROVIDER_SITE_OTHER): Payer: Medicare Other | Admitting: Podiatry

## 2020-12-21 ENCOUNTER — Other Ambulatory Visit: Payer: Self-pay

## 2020-12-21 ENCOUNTER — Encounter: Payer: Self-pay | Admitting: Podiatry

## 2020-12-21 DIAGNOSIS — L97511 Non-pressure chronic ulcer of other part of right foot limited to breakdown of skin: Secondary | ICD-10-CM | POA: Diagnosis not present

## 2020-12-21 DIAGNOSIS — L97522 Non-pressure chronic ulcer of other part of left foot with fat layer exposed: Secondary | ICD-10-CM | POA: Diagnosis not present

## 2020-12-24 NOTE — Progress Notes (Signed)
Subjective: [name] is a 65 y.o. is seen today in office s/p left transmetatarsal amputation preformed on 08/05/2020 is continue to work part-time and he states he been doing well with this and the foot has not been swelling.  They continued with a dry dressing on the left foot we did apply a small Mehta Betadine as well that there is a bit of moisture.  Denies any fevers or chills.  No other concerns.  Objective: General: No acute distress, AAOx3  Left foot: On the central aspect there is still dehiscence but is more superficial today and smaller in size.  There is no probing, undermining or tunneling.  There is no surrounding erythema, ascending cellulitis.  No fluctuation or crepitation but there is no malodor.  No exposed bone or tendon. Right foot: Along the second digit there is superficial clean the wound, breakdown present.  There is macerated tissue.  Upon removal of the sock itself is also quite damp with moisture.  There is no surrounding erythema, ascending cellulitis.  No fluctuation crepitation but there is no malodor. No pain with calf compression, swelling, warmth, erythema.   Assessment and Plan:  Status post left foot surgery, healing although slowly; right foot toe ulceration  -Treatment options discussed including all alternatives, risks, and complications -Sharply debrided the wound today utilizing #312 blade scalpel down to healthy tissue.  The wound does continue to make progress and improved.  Switch to using Prisma at this point.  Discussed we can change the Prisma every other day but will change the outer bandage every day given the moisture.  Continue offloading as well.  Surgical shoe was dispensed today as opposed to Darco wedge shoe.  -On the right foot small mount of Betadine was applied given the macerated tissue and discussed daily dressing changes.  Discussed changing socks With moisture control.  -Monitor for any clinical signs or symptoms of infection and  directed to call the office immediately should any occur or go to the ER.  Trula Slade DPM

## 2020-12-27 DIAGNOSIS — I5022 Chronic systolic (congestive) heart failure: Secondary | ICD-10-CM | POA: Diagnosis not present

## 2020-12-27 DIAGNOSIS — Z89432 Acquired absence of left foot: Secondary | ICD-10-CM | POA: Diagnosis not present

## 2020-12-27 DIAGNOSIS — I429 Cardiomyopathy, unspecified: Secondary | ICD-10-CM | POA: Diagnosis not present

## 2020-12-27 DIAGNOSIS — E119 Type 2 diabetes mellitus without complications: Secondary | ICD-10-CM | POA: Diagnosis not present

## 2020-12-30 ENCOUNTER — Other Ambulatory Visit: Payer: Medicare Other

## 2020-12-30 ENCOUNTER — Ambulatory Visit: Payer: Self-pay | Admitting: Internal Medicine

## 2021-01-06 ENCOUNTER — Other Ambulatory Visit: Payer: BC Managed Care – PPO

## 2021-01-06 ENCOUNTER — Ambulatory Visit: Payer: BC Managed Care – PPO | Admitting: Internal Medicine

## 2021-01-12 ENCOUNTER — Other Ambulatory Visit: Payer: Self-pay

## 2021-01-12 ENCOUNTER — Ambulatory Visit (INDEPENDENT_AMBULATORY_CARE_PROVIDER_SITE_OTHER): Payer: Medicare Other | Admitting: Podiatry

## 2021-01-12 DIAGNOSIS — L97511 Non-pressure chronic ulcer of other part of right foot limited to breakdown of skin: Secondary | ICD-10-CM

## 2021-01-12 DIAGNOSIS — I739 Peripheral vascular disease, unspecified: Secondary | ICD-10-CM

## 2021-01-12 DIAGNOSIS — L97522 Non-pressure chronic ulcer of other part of left foot with fat layer exposed: Secondary | ICD-10-CM | POA: Diagnosis not present

## 2021-01-17 NOTE — Progress Notes (Signed)
Subjective: [name] is a 66 y.o. is seen today in office s/p left transmetatarsal amputation preformed on 08/05/2020 which resulted in dehiscence.  He states that the wound is doing better and almost healed in the left side.  He states that the right side to about the same.  Currently denies any fevers or chills.  He has no new concerns today.  Objective: General: No acute distress, AAOx3  Left foot: On the central aspect there is still dehiscence but is more superficial today and smaller in size.  There is more callus on the area today he states it is because he did not put any ointment on it today.  After I debrided this with a central area that is almost closed.  There is no surrounding erythema, ascending cellulitis.  No fluctuation or crepitation.  There is no malodor. Right foot: Along the second and third digit superficial granular wound present.  There is macerated tissue.  Upon removal of his sock the sock was still moist.  There is no surrounding erythema ascending cellulitis. No pain with calf compression, swelling, warmth, erythema.   Assessment and Plan:  Status post left foot surgery, healing although slowly; right foot toe ulceration.  Macerated tissue  -Treatment options discussed including all alternatives, risks, and complications -Sharply debrided the wound today utilizing #312 blade scalpel down to healthy tissue to remove the callus, devitalized nonviable tissue in order to help promote wound healing.  There was no blood loss present and tolerated the procedure well.  The wound does continue to make progress and improved.  Continue with Prisma on the wound.  Monitor closely for any signs or symptoms of infection. -Exam of the right foot.  He is back to having moisture on the area.  Is soft in general is damp.  Discussed antiperspirant spray that he can use to help with the moisture but do not apply this on the wound.  Continue Betadine. -Updated ABI ordered. -Referral to the  wound care center also placed to see if they can help to prevent any further amputations or ulcers. -Monitor for any clinical signs or symptoms of infection and directed to call the office immediately should any occur or go to the ER.  Trula Slade DPM

## 2021-01-20 ENCOUNTER — Other Ambulatory Visit: Payer: Self-pay

## 2021-01-20 ENCOUNTER — Ambulatory Visit (HOSPITAL_COMMUNITY)
Admission: RE | Admit: 2021-01-20 | Discharge: 2021-01-20 | Disposition: A | Discharge status: Home / Self Care | Payer: Medicare Other | Source: Ambulatory Visit | Attending: Podiatry | Admitting: Podiatry

## 2021-01-20 DIAGNOSIS — I739 Peripheral vascular disease, unspecified: Secondary | ICD-10-CM | POA: Diagnosis not present

## 2021-01-20 DIAGNOSIS — L97511 Non-pressure chronic ulcer of other part of right foot limited to breakdown of skin: Secondary | ICD-10-CM | POA: Insufficient documentation

## 2021-01-22 ENCOUNTER — Other Ambulatory Visit: Payer: Self-pay | Admitting: Medical Oncology

## 2021-01-22 DIAGNOSIS — C8212 Follicular lymphoma grade II, intrathoracic lymph nodes: Secondary | ICD-10-CM

## 2021-01-22 NOTE — Progress Notes (Deleted)
[facility] OFFICE PROGRESS NOTE  [address]inger [address] MD [place] [address]  DIAGNOSIS: Stage III follicular lymphoma, grade 2, diagnosed in June 2012   PRIOR THERAPY: 1) Systemic chemotherapy with CHOP/Rituxan, status post 8 cycles, last dose was given 07/19/2010.  2)  Maintenance therapy with Rituxan 375 mg/m2 given every 2 months for a total of 2 years, status post 12 cycles.  CURRENT THERAPY: Observation  INTERVAL HISTORY: [name] 66 y.o. male returns to the clinic today for an annual follow-up visit.  The patient is feeling fairly well today without any concerning complaints.  He denies any recent fever, chills, night sweats, unexplained weight loss, or lymphadenopathy.  He denies any early satiety.  Denies any frequent infections.  Denies any abnormal bleeding or bruising.  Denies any abdominal bloating.  The patient is here today for evaluation and repeat blood work.    MEDICAL HISTORY: Past Medical History:  Diagnosis Date   Acute systolic HF (heart failure) (HCC)    Arthritis    lt foot, hips knees    Atrial fibrillation ([place])    Diabetes mellitus, new onset ([place])    Diverticulosis    Dyslipidemia    Gout attack 01/16/2012   Hypertension    Internal hemorrhoids    Lymphoma ([place])    remission for about 2 years, chemo 3 years prior   Pneumonia due to COVID-19 virus 07/2018   Pulmonary hypertension ([place])    Sepsis ([place]) 02/2017   secondary to influenza; requiring trach   Tubular adenoma of colon    Venous stasis ulcers ([place])     ALLERGIES:  is allergic to latex.  MEDICATIONS:  Current Outpatient Medications  Medication Sig Dispense Refill   acetaminophen (TYLENOL) 325 MG tablet Take 650 mg by mouth every 6 (six) hours as needed for moderate pain.      albuterol (VENTOLIN HFA) 108 (90 Base) MCG/ACT inhaler Inhale 2 puffs into the lungs every 6 (six) hours as needed for wheezing or shortness of breath. 1 each 2   apixaban  (ELIQUIS) 5 MG TABS tablet Take 1 tablet (5 mg total) by mouth 2 (two) times daily. 180 tablet 1   ascorbic acid (VITAMIN C) 500 MG tablet Take 1 tablet (500 mg total) by mouth 3 (three) times daily. 30 tablet 0   atorvastatin (LIPITOR) 20 MG tablet TAKE 1 TABLET BY MOUTH EVERY DAY *PT OVERDUE FOR OFFICE VISIT* 30 tablet 5   cholecalciferol (VITAMIN D) 25 MCG (1000 UNIT) tablet Take 1 tablet (1,000 Units total) by mouth daily. 30 tablet 0   colchicine 0.6 MG tablet Take 0.6 mg by mouth 2 (two) times daily as needed.     Continuous Blood Gluc Receiver (FREESTYLE LIBRE 2 READER) DEVI 1 each by Does not apply route daily. 1 each 3   Continuous Blood Gluc Sensor (FREESTYLE LIBRE 2 SENSOR) MISC 1 each by Does not apply route daily. 1 each 3   KLOR-CON M20 20 MEQ tablet Take 1 tablet (20 mEq total) by mouth daily. 90 tablet 1   losartan (COZAAR) 50 MG tablet Take 1 tablet (50 mg total) by mouth daily. 30 tablet 0   metFORMIN (GLUCOPHAGE) 500 MG tablet Take 1 tablet (500 mg total) by mouth daily with breakfast. 30 tablet 11   nitroGLYCERIN (NITRODUR - DOSED IN MG/24 HR) 0.1 mg/hr patch Place 1 patch (0.1 mg total) onto the skin daily. 30 patch 12   Omega 3 340 MG CPDR  Take 1 capsule (340 mg total) by mouth 2 (two) times daily. 60 capsule 0   omeprazole (PRILOSEC) 40 MG capsule Take 1 capsule (40 mg total) by mouth daily. 30 capsule 0   polycarbophil (FIBERCON) 625 MG tablet Take 1 tablet (625 mg total) by mouth daily. 30 tablet 0   sertraline (ZOLOFT) 50 MG tablet Take 1 tablet (50 mg total) by mouth daily. 30 tablet 0   torsemide (DEMADEX) 20 MG tablet Take 1 tablet (20 mg total) by mouth daily. 90 tablet 2   vitamin B-12 (CYANOCOBALAMIN) 1000 MCG tablet Take 1 tablet (1,000 mcg total) by mouth daily. 30 tablet 0   No current facility-administered medications for this visit.   Facility-Administered Medications Ordered in Other Visits  Medication Dose Route Frequency Provider Last Rate Last Admin    sodium chloride 0.9 % injection 10 mL  10 mL Intravenous PRN Curt Bears, MD   10 mL at 05/04/15 1529    SURGICAL HISTORY:  Past Surgical History:  Procedure Laterality Date   ABDOMINAL AORTOGRAM W/LOWER EXTREMITY N/A 08/14/2020   Procedure: ABDOMINAL AORTOGRAM W/LOWER EXTREMITY;  Surgeon: Elam Dutch, MD;  Location: [facility];  Service: Cardiovascular;  Laterality: N/A;   AMPUTATION TOE Left 05/08/2019   Procedure: AMPUTATION LEFT GREAT  TOE;  Surgeon: Trula Slade, DPM;  Location: [facility];  Service: Podiatry;  Laterality: Left;   BRONCHOSCOPY     ESOPHAGOGASTRODUODENOSCOPY ENDOSCOPY     Dr Royden Purl HERNIA REPAIR Left    2001   IR REMOVAL TUN ACCESS W/ PORT W/O FL MOD SED  02/03/2020   PORTA CATH INSERTION  2011   TRACHEOSTOMY  03/2017   with decannulation   TRANSMETATARSAL AMPUTATION Left 08/05/2020   Procedure: TRANSMETATARSAL AMPUTATION;  Surgeon: Trula Slade, DPM;  Location: [facility];  Service: Podiatry;  Laterality: Left;    REVIEW OF SYSTEMS:   Review of Systems  Constitutional: Negative for appetite change, chills, fatigue, fever and unexpected weight change.  HENT:   Negative for mouth sores, nosebleeds, sore throat and trouble swallowing.   Eyes: Negative for eye problems and icterus.  Respiratory: Negative for cough, hemoptysis, shortness of breath and wheezing.   Cardiovascular: Negative for chest pain and leg swelling.  Gastrointestinal: Negative for abdominal pain, constipation, diarrhea, nausea and vomiting.  Genitourinary: Negative for bladder incontinence, difficulty urinating, dysuria, frequency and hematuria.   Musculoskeletal: Negative for back pain, gait problem, neck pain and neck stiffness.  Skin: Negative for itching and rash.  Neurological: Negative for dizziness, extremity weakness, gait problem, headaches, light-headedness and seizures.  Hematological: Negative for adenopathy. Does not bruise/bleed easily.   Psychiatric/Behavioral: Negative for confusion, depression and sleep disturbance. The patient is not nervous/anxious.     PHYSICAL EXAMINATION:  There were no vitals taken for this visit.  ECOG PERFORMANCE STATUS: {CHL ONC ECOG [id number]  Physical Exam  Constitutional: Oriented to person, place, and time and well-developed, well-nourished, and in no distress. No distress.  HENT:  Head: Normocephalic and atraumatic.  Mouth/Throat: Oropharynx is clear and moist. No oropharyngeal exudate.  Eyes: Conjunctivae are normal. Right eye exhibits no discharge. Left eye exhibits no discharge. No scleral icterus.  Neck: Normal range of motion. Neck supple.  Cardiovascular: Normal rate, regular rhythm, normal heart sounds and intact distal pulses.   Pulmonary/Chest: Effort normal and breath sounds normal. No respiratory distress. No wheezes. No rales.  Abdominal: Soft. Bowel sounds are normal. Exhibits no distension and no mass. There is  no tenderness.  Musculoskeletal: Normal range of motion. Exhibits no edema.  Lymphadenopathy:    No cervical adenopathy.  Neurological: Alert and oriented to person, place, and time. Exhibits normal muscle tone. Gait normal. Coordination normal.  Skin: Skin is warm and dry. No rash noted. Not diaphoretic. No erythema. No pallor.  Psychiatric: Mood, memory and judgment normal.  Vitals reviewed.  LABORATORY DATA: Lab Results  Component Value Date   WBC 6.4 08/24/2020   HGB 13.8 08/24/2020   HCT 43.6 08/24/2020   MCV 95.2 08/24/2020   PLT 291 08/24/2020      Chemistry      Component Value Date/Time   NA 138 08/24/2020 0745   NA 141 11/11/2019 1506   NA 137 10/24/2016 1440   K 3.8 08/24/2020 0745   K 3.6 10/24/2016 1440   CL 102 08/24/2020 0745   CL 105 05/14/2012 0840   CO2 28 08/24/2020 0745   CO2 26 10/24/2016 1440   BUN 21 08/24/2020 0745   BUN 16 11/11/2019 1506   BUN 16.2 10/24/2016 1440   CREATININE 1.05 08/24/2020 0745   CREATININE  0.84 01/07/2020 1007   CREATININE 0.8 10/24/2016 1440      Component Value Date/Time   CALCIUM 9.5 08/24/2020 0745   CALCIUM 9.2 10/24/2016 1440   ALKPHOS 135 (H) 08/19/2020 0455   ALKPHOS 131 10/24/2016 1440   AST 32 08/19/2020 0455   AST 22 01/07/2020 1007   AST 31 10/24/2016 1440   ALT 28 08/19/2020 0455   ALT 17 01/07/2020 1007   ALT 31 10/24/2016 1440   BILITOT 1.2 08/19/2020 0455   BILITOT 0.9 01/07/2020 1007   BILITOT 0.61 10/24/2016 1440       RADIOGRAPHIC STUDIES:  VAS [place] ABI WITH/WO TBI  Result Date: 01/21/2021  LOWER EXTREMITY DOPPLER STUDY Patient Name:  [name]  Date of Exam:   01/20/2021 Medical Rec #: [id number]     Accession #:    [id number] Date of Birth: 06-21-1955     Patient Gender: M Patient Age:   27 years Exam Location:  [facility] Procedure:      VAS [place] ABI WITH/WO TBI Referring Phys: Celesta Gentile --------------------------------------------------------------------------------  Indications: Peripheral artery disease, and left transmetatarsal amputation.  Performing Technologist: Ralene Cork RVT  Examination Guidelines: A complete evaluation includes at minimum, Doppler waveform signals and systolic blood pressure reading at the level of bilateral brachial, anterior tibial, and posterior tibial arteries, when vessel segments are accessible. Bilateral testing is considered an integral part of a complete examination. Photoelectric Plethysmograph (PPG) waveforms and toe systolic pressure readings are included as required and additional duplex testing as needed. Limited examinations for reoccurring indications may be performed as noted.  ABI Findings: +---------+------------------+-----+----------+--------+  Right     Rt Pressure (mmHg) Index Waveform   Comment   +---------+------------------+-----+----------+--------+  Brachial  160                                           +---------+------------------+-----+----------+--------+  PTA       192                 1.06  monophasic           +---------+------------------+-----+----------+--------+  DP        159  0.88  monophasic           +---------+------------------+-----+----------+--------+  Great Toe 145                0.80                       +---------+------------------+-----+----------+--------+ +--------+------------------+-----+---------+-------+  Left     Lt Pressure (mmHg) Index Waveform  Comment  +--------+------------------+-----+---------+-------+  Brachial 181                                         +--------+------------------+-----+---------+-------+  PTA      204                1.13  triphasic          +--------+------------------+-----+---------+-------+  DP       194                1.07  biphasic           +--------+------------------+-----+---------+-------+ +-------+-----------+-----------+------------+------------+  ABI/TBI Today's ABI Today's TBI Previous ABI Previous TBI  +-------+-----------+-----------+------------+------------+  Right   1.06        0.8         1.26         0.65          +-------+-----------+-----------+------------+------------+  Left    1.13        amputation  1.15         amputation    +-------+-----------+-----------+------------+------------+  Previous ABI on 08/06/20 at [facility] Pedal pressures falsely elevated due to medial calcification.  Summary: Right: Resting right ankle-brachial index is within normal range. The right toe-brachial index is normal. Left: Resting left ankle-brachial index is within normal range.  *See table(s) above for measurements and observations.  Electronically signed by Deitra Mayo MD on 01/21/2021 at 8:19:56 AM.    Final      ASSESSMENT/PLAN:  This is a very pleasant 66 year old Caucasian male diagnosed with stage IIIa follicular lymphoma.  He was diagnosed in 2012.  The patient underwent treatment with R-CHOP followed by maintenance Rituxan for 2 years.  He has been on observation for several  years  The patient was seen with Dr. Julien Nordmann today.  He had repeat lab work performed which showed ***.  Dr. Julien Nordmann recommends that he continue on observation with repeat blood work in 1 year.  The patient was advised to call immediately if she has any concerning symptoms in the interval. The patient voices understanding of current disease status and treatment options and is in agreement with the current care plan. All questions were answered. The patient knows to call the clinic with any problems, questions or concerns. We can certainly see the patient much sooner if necessary       No orders of the defined types were placed in this encounter.    I spent {CHL ONC TIME VISIT - KDXIP:[id number]} counseling the patient face to face. The total time spent in the appointment was {CHL ONC TIME VISIT - BHALP:[id number]}.  Benay Pomeroy L Amberrose Friebel, PA-C 01/22/21

## 2021-01-25 ENCOUNTER — Inpatient Hospital Stay: Payer: Medicare Other | Admitting: Physician Assistant

## 2021-01-25 ENCOUNTER — Inpatient Hospital Stay: Payer: Medicare Other

## 2021-01-26 ENCOUNTER — Ambulatory Visit (INDEPENDENT_AMBULATORY_CARE_PROVIDER_SITE_OTHER): Payer: Medicare Other | Admitting: Podiatry

## 2021-01-26 ENCOUNTER — Other Ambulatory Visit: Payer: Self-pay

## 2021-01-26 DIAGNOSIS — Z7901 Long term (current) use of anticoagulants: Secondary | ICD-10-CM | POA: Diagnosis not present

## 2021-01-26 DIAGNOSIS — L97511 Non-pressure chronic ulcer of other part of right foot limited to breakdown of skin: Secondary | ICD-10-CM | POA: Diagnosis not present

## 2021-01-26 DIAGNOSIS — B351 Tinea unguium: Secondary | ICD-10-CM | POA: Diagnosis not present

## 2021-01-26 DIAGNOSIS — L97522 Non-pressure chronic ulcer of other part of left foot with fat layer exposed: Secondary | ICD-10-CM

## 2021-01-26 DIAGNOSIS — I739 Peripheral vascular disease, unspecified: Secondary | ICD-10-CM

## 2021-01-28 NOTE — Progress Notes (Deleted)
[facility] OFFICE PROGRESS NOTE  Ginger Organ., MD [place] [address]  DIAGNOSIS: ***  PRIOR THERAPY:  CURRENT THERAPY:  INTERVAL HISTORY: [name] 66 y.o. male returns for *** regular *** visit for followup of ***   MEDICAL HISTORY: Past Medical History:  Diagnosis Date   Acute systolic HF (heart failure) (HCC)    Arthritis    lt foot, hips knees    Atrial fibrillation ([place])    Diabetes mellitus, new onset ([place])    Diverticulosis    Dyslipidemia    Gout attack 01/16/2012   Hypertension    Internal hemorrhoids    Lymphoma ([place])    remission for about 2 years, chemo 3 years prior   Pneumonia due to COVID-19 virus 07/2018   Pulmonary hypertension ([place])    Sepsis ([place]) 02/2017   secondary to influenza; requiring trach   Tubular adenoma of colon    Venous stasis ulcers ([place])     ALLERGIES:  is allergic to latex.  MEDICATIONS:  Current Outpatient Medications  Medication Sig Dispense Refill   acetaminophen (TYLENOL) 325 MG tablet Take 650 mg by mouth every 6 (six) hours as needed for moderate pain.      albuterol (VENTOLIN HFA) 108 (90 Base) MCG/ACT inhaler Inhale 2 puffs into the lungs every 6 (six) hours as needed for wheezing or shortness of breath. 1 each 2   apixaban (ELIQUIS) 5 MG TABS tablet Take 1 tablet (5 mg total) by mouth 2 (two) times daily. 180 tablet 1   ascorbic acid (VITAMIN C) 500 MG tablet Take 1 tablet (500 mg total) by mouth 3 (three) times daily. 30 tablet 0   atorvastatin (LIPITOR) 20 MG tablet TAKE 1 TABLET BY MOUTH EVERY DAY *PT OVERDUE FOR OFFICE VISIT* 30 tablet 5   cholecalciferol (VITAMIN D) 25 MCG (1000 UNIT) tablet Take 1 tablet (1,000 Units total) by mouth daily. 30 tablet 0   colchicine 0.6 MG tablet Take 0.6 mg by mouth 2 (two) times daily as needed.     Continuous Blood Gluc Receiver (FREESTYLE LIBRE 2 READER) DEVI 1 each by Does not apply route daily. 1 each 3   Continuous Blood Gluc Sensor  (FREESTYLE LIBRE 2 SENSOR) MISC 1 each by Does not apply route daily. 1 each 3   KLOR-CON M20 20 MEQ tablet Take 1 tablet (20 mEq total) by mouth daily. 90 tablet 1   losartan (COZAAR) 50 MG tablet Take 1 tablet (50 mg total) by mouth daily. 30 tablet 0   metFORMIN (GLUCOPHAGE) 500 MG tablet Take 1 tablet (500 mg total) by mouth daily with breakfast. 30 tablet 11   nitroGLYCERIN (NITRODUR - DOSED IN MG/24 HR) 0.1 mg/hr patch Place 1 patch (0.1 mg total) onto the skin daily. 30 patch 12   Omega 3 340 MG CPDR Take 1 capsule (340 mg total) by mouth 2 (two) times daily. 60 capsule 0   omeprazole (PRILOSEC) 40 MG capsule Take 1 capsule (40 mg total) by mouth daily. 30 capsule 0   polycarbophil (FIBERCON) 625 MG tablet Take 1 tablet (625 mg total) by mouth daily. 30 tablet 0   sertraline (ZOLOFT) 50 MG tablet Take 1 tablet (50 mg total) by mouth daily. 30 tablet 0   torsemide (DEMADEX) 20 MG tablet Take 1 tablet (20 mg total) by mouth daily. 90 tablet 2   vitamin B-12 (CYANOCOBALAMIN) 1000 MCG tablet Take 1 tablet (1,000 mcg total) by mouth daily. 30 tablet  0   No current facility-administered medications for this visit.   Facility-Administered Medications Ordered in Other Visits  Medication Dose Route Frequency Provider Last Rate Last Admin   sodium chloride 0.9 % injection 10 mL  10 mL Intravenous PRN Curt Bears, MD   10 mL at 05/04/15 1529    SURGICAL HISTORY:  Past Surgical History:  Procedure Laterality Date   ABDOMINAL AORTOGRAM W/LOWER EXTREMITY N/A 08/14/2020   Procedure: ABDOMINAL AORTOGRAM W/LOWER EXTREMITY;  Surgeon: Elam Dutch, MD;  Location: [facility];  Service: Cardiovascular;  Laterality: N/A;   AMPUTATION TOE Left 05/08/2019   Procedure: AMPUTATION LEFT GREAT  TOE;  Surgeon: Trula Slade, DPM;  Location: [facility];  Service: Podiatry;  Laterality: Left;   BRONCHOSCOPY     ESOPHAGOGASTRODUODENOSCOPY ENDOSCOPY     Dr Royden Purl HERNIA REPAIR Left     2001   IR REMOVAL TUN ACCESS W/ PORT W/O FL MOD SED  02/03/2020   PORTA CATH INSERTION  2011   TRACHEOSTOMY  03/2017   with decannulation   TRANSMETATARSAL AMPUTATION Left 08/05/2020   Procedure: TRANSMETATARSAL AMPUTATION;  Surgeon: Trula Slade, DPM;  Location: [facility];  Service: Podiatry;  Laterality: Left;    REVIEW OF SYSTEMS:   Review of Systems  Constitutional: Negative for appetite change, chills, fatigue, fever and unexpected weight change.  HENT:   Negative for mouth sores, nosebleeds, sore throat and trouble swallowing.   Eyes: Negative for eye problems and icterus.  Respiratory: Negative for cough, hemoptysis, shortness of breath and wheezing.   Cardiovascular: Negative for chest pain and leg swelling.  Gastrointestinal: Negative for abdominal pain, constipation, diarrhea, nausea and vomiting.  Genitourinary: Negative for bladder incontinence, difficulty urinating, dysuria, frequency and hematuria.   Musculoskeletal: Negative for back pain, gait problem, neck pain and neck stiffness.  Skin: Negative for itching and rash.  Neurological: Negative for dizziness, extremity weakness, gait problem, headaches, light-headedness and seizures.  Hematological: Negative for adenopathy. Does not bruise/bleed easily.  Psychiatric/Behavioral: Negative for confusion, depression and sleep disturbance. The patient is not nervous/anxious.     PHYSICAL EXAMINATION:  There were no vitals taken for this visit.  ECOG PERFORMANCE STATUS: {CHL ONC ECOG [id number]  Physical Exam  Constitutional: Oriented to person, place, and time and well-developed, well-nourished, and in no distress. No distress.  HENT:  Head: Normocephalic and atraumatic.  Mouth/Throat: Oropharynx is clear and moist. No oropharyngeal exudate.  Eyes: Conjunctivae are normal. Right eye exhibits no discharge. Left eye exhibits no discharge. No scleral icterus.  Neck: Normal range of motion. Neck supple.  Cardiovascular:  Normal rate, regular rhythm, normal heart sounds and intact distal pulses.   Pulmonary/Chest: Effort normal and breath sounds normal. No respiratory distress. No wheezes. No rales.  Abdominal: Soft. Bowel sounds are normal. Exhibits no distension and no mass. There is no tenderness.  Musculoskeletal: Normal range of motion. Exhibits no edema.  Lymphadenopathy:    No cervical adenopathy.  Neurological: Alert and oriented to person, place, and time. Exhibits normal muscle tone. Gait normal. Coordination normal.  Skin: Skin is warm and dry. No rash noted. Not diaphoretic. No erythema. No pallor.  Psychiatric: Mood, memory and judgment normal.  Vitals reviewed.  LABORATORY DATA: Lab Results  Component Value Date   WBC 6.4 08/24/2020   HGB 13.8 08/24/2020   HCT 43.6 08/24/2020   MCV 95.2 08/24/2020   PLT 291 08/24/2020      Chemistry      Component Value  Date/Time   NA 138 08/24/2020 0745   NA 141 11/11/2019 1506   NA 137 10/24/2016 1440   K 3.8 08/24/2020 0745   K 3.6 10/24/2016 1440   CL 102 08/24/2020 0745   CL 105 05/14/2012 0840   CO2 28 08/24/2020 0745   CO2 26 10/24/2016 1440   BUN 21 08/24/2020 0745   BUN 16 11/11/2019 1506   BUN 16.2 10/24/2016 1440   CREATININE 1.05 08/24/2020 0745   CREATININE 0.84 01/07/2020 1007   CREATININE 0.8 10/24/2016 1440      Component Value Date/Time   CALCIUM 9.5 08/24/2020 0745   CALCIUM 9.2 10/24/2016 1440   ALKPHOS 135 (H) 08/19/2020 0455   ALKPHOS 131 10/24/2016 1440   AST 32 08/19/2020 0455   AST 22 01/07/2020 1007   AST 31 10/24/2016 1440   ALT 28 08/19/2020 0455   ALT 17 01/07/2020 1007   ALT 31 10/24/2016 1440   BILITOT 1.2 08/19/2020 0455   BILITOT 0.9 01/07/2020 1007   BILITOT 0.61 10/24/2016 1440       RADIOGRAPHIC STUDIES:  VAS [place] ABI WITH/WO TBI  Result Date: 01/21/2021  LOWER EXTREMITY DOPPLER STUDY Patient Name:  [name]  Date of Exam:   01/20/2021 Medical Rec #: [id number]     Accession #:    [id number]  Date of Birth: October 22, 1955     Patient Gender: M Patient Age:   8 years Exam Location:  [facility] Procedure:      VAS [place] ABI WITH/WO TBI Referring Phys: Celesta Gentile --------------------------------------------------------------------------------  Indications: Peripheral artery disease, and left transmetatarsal amputation.  Performing Technologist: Ralene Cork RVT  Examination Guidelines: A complete evaluation includes at minimum, Doppler waveform signals and systolic blood pressure reading at the level of bilateral brachial, anterior tibial, and posterior tibial arteries, when vessel segments are accessible. Bilateral testing is considered an integral part of a complete examination. Photoelectric Plethysmograph (PPG) waveforms and toe systolic pressure readings are included as required and additional duplex testing as needed. Limited examinations for reoccurring indications may be performed as noted.  ABI Findings: +---------+------------------+-----+----------+--------+  Right     Rt Pressure (mmHg) Index Waveform   Comment   +---------+------------------+-----+----------+--------+  Brachial  160                                           +---------+------------------+-----+----------+--------+  PTA       192                1.06  monophasic           +---------+------------------+-----+----------+--------+  DP        159                0.88  monophasic           +---------+------------------+-----+----------+--------+  Great Toe 145                0.80                       +---------+------------------+-----+----------+--------+ +--------+------------------+-----+---------+-------+  Left     Lt Pressure (mmHg) Index Waveform  Comment  +--------+------------------+-----+---------+-------+  Brachial 181                                         +--------+------------------+-----+---------+-------+  PTA      204                1.13  triphasic           +--------+------------------+-----+---------+-------+  DP       194                1.07  biphasic           +--------+------------------+-----+---------+-------+ +-------+-----------+-----------+------------+------------+  ABI/TBI Today's ABI Today's TBI Previous ABI Previous TBI  +-------+-----------+-----------+------------+------------+  Right   1.06        0.8         1.26         0.65          +-------+-----------+-----------+------------+------------+  Left    1.13        amputation  1.15         amputation    +-------+-----------+-----------+------------+------------+  Previous [facility] on 08/06/20 at [facility] Pedal pressures falsely elevated due to medial calcification.  Summary: Right: Resting right ankle-brachial index is within normal range. The right toe-brachial index is normal. Left: Resting left ankle-brachial index is within normal range.  *See table(s) above for measurements and observations.  Electronically signed by Deitra Mayo MD on 01/21/2021 at 8:19:56 AM.    Final      ASSESSMENT/PLAN:  No problem-specific Assessment & Plan notes found for this encounter.   No orders of the defined types were placed in this encounter.    I spent {CHL ONC TIME VISIT - MLYYT:[id number]} counseling the patient face to face. The total time spent in the appointment was {CHL ONC TIME VISIT - XNTZG:[id number]}.  Rielle Schlauch L Zuma Hust, PA-C 01/28/21

## 2021-01-29 NOTE — Progress Notes (Signed)
Subjective: [name] is a 66 y.o. is seen today in office s/p left transmetatarsal amputation preformed on 08/05/2020 which resulted in dehiscence.  This is almost healed he states.  The wound on the right toes are doing better.  He has been using the antiperspirant to help with moisture control which has been helping.  He has not seen any drainage or pus or any increase in swelling or redness.  Also recently had ABI preformed.  Also asked for the nails on the right foot to be trimmed today.  No fevers or chills.  No other concerns.  Objective: General: No acute distress, AAOx3  Nails are hypertrophic, dystrophic, discolored x5 on the right foot.  No edema, erythema to the toenail sites. Left foot: On the central aspect there is still dehiscence but is more superficial today and smaller in size and appears almost completely healed at this time and only 1 small area still open.  There is no surrounding erythema, ascending cellulitis.  There is no drainage or pus.  No fluctuation or crepitation.  There is no malodor. Right foot: Along the second and third digit superficial granular wound present.  Somewhat smaller today.  There is macerated tissue.  Upon removal of his sock the sock was still moist but not as much.  There is no surrounding erythema ascending cellulitis. No pain with calf compression, swelling, warmth, erythema.   Assessment and Plan:  Status post left foot surgery, healing although slowly; right foot toe ulceration.  Onychomycosis, currently on Eliquis  -Treatment options discussed including all alternatives, risks, and complications -Sharply debrided the wound today utilizing #312 blade scalpel down to healthy tissue to remove the callus, devitalized nonviable tissue in order to help promote wound healing.  There was no blood loss present and tolerated the procedure well.  The wound does continue to make progress and improved.  Continue with Prisma on the wound.  Monitor closely for  any signs or symptoms of infection. -On the right foot the wounds are somewhat improved.  We will continue with Betadine wet-to-dry for now given the moisture. -ABI reviewed.  Referral to vascular surgery for reevaluation. -Nails were sharply debrided x5 without any complications or bleeding.  Trula Slade DPM

## 2021-02-01 ENCOUNTER — Inpatient Hospital Stay: Payer: Medicare Other | Admitting: Physician Assistant

## 2021-02-01 ENCOUNTER — Inpatient Hospital Stay: Payer: Medicare Other

## 2021-02-08 ENCOUNTER — Encounter: Payer: Medicare Other | Admitting: Podiatry

## 2021-02-09 ENCOUNTER — Telehealth: Payer: Self-pay | Admitting: Podiatry

## 2021-02-09 ENCOUNTER — Other Ambulatory Visit: Payer: Self-pay

## 2021-02-09 ENCOUNTER — Ambulatory Visit (INDEPENDENT_AMBULATORY_CARE_PROVIDER_SITE_OTHER): Payer: Medicare Other | Admitting: Podiatry

## 2021-02-09 ENCOUNTER — Ambulatory Visit: Payer: Medicare Other

## 2021-02-09 ENCOUNTER — Ambulatory Visit (INDEPENDENT_AMBULATORY_CARE_PROVIDER_SITE_OTHER): Payer: Medicare Other

## 2021-02-09 DIAGNOSIS — L97511 Non-pressure chronic ulcer of other part of right foot limited to breakdown of skin: Secondary | ICD-10-CM | POA: Diagnosis not present

## 2021-02-09 DIAGNOSIS — Z89422 Acquired absence of other left toe(s): Secondary | ICD-10-CM

## 2021-02-09 DIAGNOSIS — E08621 Diabetes mellitus due to underlying condition with foot ulcer: Secondary | ICD-10-CM

## 2021-02-09 DIAGNOSIS — L97522 Non-pressure chronic ulcer of other part of left foot with fat layer exposed: Secondary | ICD-10-CM

## 2021-02-09 DIAGNOSIS — Z89412 Acquired absence of left great toe: Secondary | ICD-10-CM

## 2021-02-09 NOTE — Telephone Encounter (Signed)
Per Guinevere Ferrari @ [employer] pts plan is effective as of 1.1.2023 must be in network and Dr Jacqualyn Posey is in network. [phone] are covered @ 80% no deductible applies. No Josem Kaufmann is needed unless dme is over 1200.00 out of pocket is 3700.00(met 75.00) reference number Guinevere Ferrari 1.31.2023

## 2021-02-09 NOTE — Progress Notes (Signed)
SITUATION Reason for Consult: Evaluation for Prefabricated Diabetic Shoes and Bilateral Custom Diabetic Inserts. Patient / Caregiver Report: Patient would like well fitting shoes  OBJECTIVE DATA: Patient History / Diagnosis:    ICD-10-CM   1. Diabetic ulcer of left midfoot associated with diabetes mellitus due to underlying condition, with fat layer exposed ([place])  P79.480    L97.422     2. Acquired absence of left great toe ([place])  Z89.412     3. Acquired absence of other left toe(s) (HCC)  [id number]       Current or Previous Devices:   None and no history  In-Person Foot Examination: Ulcers & Callousing:   Historical left foot, right toes dorsal  Toe / Foot Deformities:   - Pes planus - Hammertoes   Shoe Size: 13W  ORTHOTIC RECOMMENDATION Recommended Devices: - 1x pair prefabricated PDAC approved diabetic shoes: Patient Selected - [id number] 13W - 3x pair custom-to-patient vacuum formed diabetic insoles.   GOALS OF SHOES AND INSOLES - Reduce shear and pressure - Reduce / Prevent callus formation - Reduce / Prevent ulceration - Protect the fragile healing compromised diabetic foot.  Patient would benefit from diabetic shoes and inserts as patient has diabetes mellitus and the patient has one or more of the following conditions: - History of partial or complete amputation of the foot - History of previous foot ulceration. - History of pre-ulcerative callus - Peripheral neuropathy with evidence of callus formation - Foot deformity - Poor circulation  ACTIONS PERFORMED Patient was casted for insoles via crush box and measured for shoes via brannock device. Procedure was explained and patient tolerated procedure well. All questions were answered and concerns addressed.  PLAN Patient is to ensure treating physician receives and completes diabetic paperwork. Casts and shoe order are to be held until paperwork is received. Once received patient is to be scheduled for fitting in  four weeks.

## 2021-02-11 ENCOUNTER — Ambulatory Visit: Payer: Medicare Other

## 2021-02-14 NOTE — Progress Notes (Signed)
Subjective: [name] is a 66 y.o. is seen today in office s/p left transmetatarsal amputation preformed on 08/05/2020 which resulted in dehiscence.  He is continue with dressings on the foot on the left side except the last couple days has been leaving it open.  He is doing better he denies any swelling or redness or any drainage.  On the right side is continued with Betadine on the wounds.  Getting the Xarelto doing better.  Has also tried changing socks is using antiperspirant as well to help control moisture.  No new open lesions that he notes.  No fevers or chills.  Objective: General: No acute distress, AAOx3  Left foot: On the central aspect is a hyperkeratotic lesion.  Upon debridement it is superficial, and is almost healed at this time.  There is no surrounding erythema, ascending cellulitis.  No fluctuation crepitation.  No signs of infection. Right foot: Along the second and third digit superficial granular wound present.   There is macerated tissue.  There is no surrounding erythema, ascending cellulitis.  No fluctuation or crepitation but there is no malodor.   No pain with calf compression, swelling, warmth, erythema.        Assessment and Plan:  Status post left foot surgery, healing although slowly; right foot toe ulceration.  Onychomycosis, currently on Eliquis  -Treatment options discussed including all alternatives, risks, and complications -On the left side sharply debrided the hyperkeratotic tissue without any complications.  Continue with dressing changes daily.  The right side I did clean the wounds as well and utilizing #312 scalpel to debride the fibrotic tissue that was present.  Continue with Betadine dressing changes for now. -Scheduled to follow up at the wound care center as well to see if they can help assist is accelerated wound healing. -Monitor for any clinical signs or symptoms of infection and directed to call the office immediately should any occur or go to  the ER.  Trula Slade DPM

## 2021-02-15 ENCOUNTER — Encounter (HOSPITAL_BASED_OUTPATIENT_CLINIC_OR_DEPARTMENT_OTHER): Payer: Medicare Other | Admitting: Internal Medicine

## 2021-02-19 NOTE — Progress Notes (Signed)
[place] OFFICE PROGRESS NOTE  Ginger Organ., MD [place] [address]  DIAGNOSIS: Stage III follicular lymphoma, grade 2, diagnosed in June 2012   PRIOR THERAPY: 1) Systemic chemotherapy with CHOP/Rituxan, status post 8 cycles, last dose was given 07/19/2010.  2)  Maintenance therapy with Rituxan 375 mg/m2 given every 2 months for a total of 2 years, status post 12 cycles.  CURRENT THERAPY: Observation.  INTERVAL HISTORY: [name] 66 y.o. male returns to the clinic today for an annual follow-up visit accompanied by his sister.  The patient is feeling fairly well today without any concerning complaints. In the interval, he had his toes amputated. He denies any recent fever, chills, night sweats, unexplained weight loss, or lymphadenopathy.  He has been trying to lose some weight. Denies any recent signs and symptoms of infection including sore throat, nasal congestion, cough, skin infections, dysuria, diarrhea, or nausea.  Denies any abdominal fullness or early satiety.  Denies any abnormal bleeding or bruising.  The patient is here today for evaluation and repeat blood work.   MEDICAL HISTORY: Past Medical History:  Diagnosis Date   Acute systolic HF (heart failure) (HCC)    Arthritis    lt foot, hips knees    Atrial fibrillation ([place])    Diabetes mellitus, new onset ([place])    Diverticulosis    Dyslipidemia    Gout attack 01/16/2012   Hypertension    Internal hemorrhoids    Lymphoma ([place])    remission for about 2 years, chemo 3 years prior   Pneumonia due to COVID-19 virus 07/2018   Pulmonary hypertension ([place])    Sepsis ([place]) 02/2017   secondary to influenza; requiring trach   Tubular adenoma of colon    Venous stasis ulcers ([place])     ALLERGIES:  is allergic to latex.  MEDICATIONS:  Current Outpatient Medications  Medication Sig Dispense Refill   acetaminophen (TYLENOL) 325 MG tablet Take 650 mg by mouth every 6 (six) hours as  needed for moderate pain.      albuterol (VENTOLIN HFA) 108 (90 Base) MCG/ACT inhaler Inhale 2 puffs into the lungs every 6 (six) hours as needed for wheezing or shortness of breath. 1 each 2   apixaban (ELIQUIS) 5 MG TABS tablet Take 1 tablet (5 mg total) by mouth 2 (two) times daily. 180 tablet 1   ascorbic acid (VITAMIN C) 500 MG tablet Take 1 tablet (500 mg total) by mouth 3 (three) times daily. 30 tablet 0   atorvastatin (LIPITOR) 20 MG tablet TAKE 1 TABLET BY MOUTH EVERY DAY *PT OVERDUE FOR OFFICE VISIT* 30 tablet 5   cholecalciferol (VITAMIN D) 25 MCG (1000 UNIT) tablet Take 1 tablet (1,000 Units total) by mouth daily. 30 tablet 0   colchicine 0.6 MG tablet Take 0.6 mg by mouth 2 (two) times daily as needed.     Continuous Blood Gluc Receiver (FREESTYLE LIBRE 2 READER) DEVI 1 each by Does not apply route daily. (Patient not taking: Reported on 02/22/2021) 1 each 3   Continuous Blood Gluc Sensor (FREESTYLE LIBRE 2 SENSOR) MISC 1 each by Does not apply route daily. (Patient not taking: Reported on 02/22/2021) 1 each 3   KLOR-CON M20 20 MEQ tablet Take 1 tablet (20 mEq total) by mouth daily. 90 tablet 1   losartan (COZAAR) 50 MG tablet Take 1 tablet (50 mg total) by mouth daily. 30 tablet 0   metFORMIN (GLUCOPHAGE) 500 MG tablet Take 1 tablet (  500 mg total) by mouth daily with breakfast. (Patient not taking: Reported on 02/22/2021) 30 tablet 11   nitroGLYCERIN (NITRODUR - DOSED IN MG/24 HR) 0.1 mg/hr patch Place 1 patch (0.1 mg total) onto the skin daily. 30 patch 12   Omega 3 340 MG CPDR Take 1 capsule (340 mg total) by mouth 2 (two) times daily. 60 capsule 0   omeprazole (PRILOSEC) 40 MG capsule Take 1 capsule (40 mg total) by mouth daily. 30 capsule 0   polycarbophil (FIBERCON) 625 MG tablet Take 1 tablet (625 mg total) by mouth daily. 30 tablet 0   sertraline (ZOLOFT) 50 MG tablet Take 1 tablet (50 mg total) by mouth daily. 30 tablet 0   torsemide (DEMADEX) 20 MG tablet Take 1 tablet (20 mg  total) by mouth daily. 90 tablet 2   vitamin B-12 (CYANOCOBALAMIN) 1000 MCG tablet Take 1 tablet (1,000 mcg total) by mouth daily. 30 tablet 0   No current facility-administered medications for this visit.   Facility-Administered Medications Ordered in Other Visits  Medication Dose Route Frequency Provider Last Rate Last Admin   sodium chloride 0.9 % injection 10 mL  10 mL Intravenous PRN Curt Bears, MD   10 mL at 05/04/15 1529    SURGICAL HISTORY:  Past Surgical History:  Procedure Laterality Date   ABDOMINAL AORTOGRAM W/LOWER EXTREMITY N/A 08/14/2020   Procedure: ABDOMINAL AORTOGRAM W/LOWER EXTREMITY;  Surgeon: Elam Dutch, MD;  Location: [facility];  Service: Cardiovascular;  Laterality: N/A;   AMPUTATION TOE Left 05/08/2019   Procedure: AMPUTATION LEFT GREAT  TOE;  Surgeon: Trula Slade, DPM;  Location: [facility];  Service: Podiatry;  Laterality: Left;   BRONCHOSCOPY     ESOPHAGOGASTRODUODENOSCOPY ENDOSCOPY     Dr Royden Purl HERNIA REPAIR Left    2001   IR REMOVAL TUN ACCESS W/ PORT W/O FL MOD SED  02/03/2020   PORTA CATH INSERTION  2011   TRACHEOSTOMY  03/2017   with decannulation   TRANSMETATARSAL AMPUTATION Left 08/05/2020   Procedure: TRANSMETATARSAL AMPUTATION;  Surgeon: Trula Slade, DPM;  Location: [facility];  Service: Podiatry;  Laterality: Left;    REVIEW OF SYSTEMS:   Review of Systems  Constitutional: Negative for appetite change, chills, fatigue, fever and unexpected weight change.  HENT: Negative for mouth sores, nosebleeds, sore throat and trouble swallowing.   Eyes: Negative for eye problems and icterus.  Respiratory: Negative for cough, hemoptysis, shortness of breath and wheezing.   Cardiovascular: Negative for chest pain and leg swelling.  Gastrointestinal: Negative for abdominal pain, constipation, diarrhea, nausea and vomiting.  Genitourinary: Negative for bladder incontinence, difficulty urinating, dysuria, frequency and  hematuria.   Musculoskeletal: Negative for back pain, gait problem, neck pain and neck stiffness.  Skin: Negative for itching and rash.  Neurological: Negative for dizziness, extremity weakness, gait problem, headaches, light-headedness and seizures.  Hematological: Negative for adenopathy. Does not bruise/bleed easily.  Psychiatric/Behavioral: Negative for confusion, depression and sleep disturbance. The patient is not nervous/anxious.     PHYSICAL EXAMINATION:  Blood pressure 137/90, pulse (!) 59, temperature (!) 97.3 F (36.3 C), temperature source Tympanic, resp. rate 17, weight (!) 339 lb 4 oz (153.9 kg), SpO2 92 %.  ECOG PERFORMANCE STATUS: 0  Physical Exam  Constitutional: Oriented to person, place, and time and well-developed, well-nourished, and in no distress. HENT:  Head: Normocephalic and atraumatic.  Mouth/Throat: Oropharynx is clear and moist. No oropharyngeal exudate.  Eyes: Conjunctivae are normal. Right eye exhibits  no discharge. Left eye exhibits no discharge. No scleral icterus.  Neck: Normal range of motion. Neck supple.  Cardiovascular: Normal rate, regular rhythm, normal heart sounds and intact distal pulses.   Pulmonary/Chest: Effort normal and breath sounds normal. No respiratory distress. No wheezes. No rales.  Abdominal: Soft. Bowel sounds are normal. Exhibits no distension and no mass. There is no tenderness.  Musculoskeletal: Normal range of motion. Exhibits no edema. Boot on left foot. Positive for left toe amputation.  Lymphadenopathy:    No cervical adenopathy.  Neurological: Alert and oriented to person, place, and time. Exhibits normal muscle tone. The patient examined in the wheelchair.  Skin: Skin is warm and dry. No rash noted. Not diaphoretic. No erythema. No pallor.  Psychiatric: Mood, memory and judgment normal.  Vitals reviewed.  LABORATORY DATA: Lab Results  Component Value Date   WBC 6.8 02/22/2021   HGB 13.9 02/22/2021   HCT 41.6  02/22/2021   MCV 93.3 02/22/2021   PLT 169 02/22/2021      Chemistry      Component Value Date/Time   NA 139 02/22/2021 1452   NA 141 11/11/2019 1506   NA 137 10/24/2016 1440   K 3.8 02/22/2021 1452   K 3.6 10/24/2016 1440   CL 102 02/22/2021 1452   CL 105 05/14/2012 0840   CO2 28 02/22/2021 1452   CO2 26 10/24/2016 1440   BUN 22 02/22/2021 1452   BUN 16 11/11/2019 1506   BUN 16.2 10/24/2016 1440   CREATININE 1.04 02/22/2021 1452   CREATININE 0.8 10/24/2016 1440      Component Value Date/Time   CALCIUM 9.4 02/22/2021 1452   CALCIUM 9.2 10/24/2016 1440   ALKPHOS 153 (H) 02/22/2021 1452   ALKPHOS 131 10/24/2016 1440   AST 22 02/22/2021 1452   AST 31 10/24/2016 1440   ALT 18 02/22/2021 1452   ALT 31 10/24/2016 1440   BILITOT 1.6 (H) 02/22/2021 1452   BILITOT 0.61 10/24/2016 1440       RADIOGRAPHIC STUDIES:  DG Foot Complete Right  Result Date: 02/09/2021 Please see detailed radiograph report in office note.    ASSESSMENT/PLAN:  This is a very pleasant 66 year old Caucasian male with a history of stage IIIa follicular lymphoma.  The patient is status post Rituxan CHOP followed by maintenance Rituxan for 2 years.  He has been on observation since 11/19/2012.   The patient is feeling fine today without any concerning complaints.  His repeat CBC, CMP, and LDH today show a CBC WNL. His CMP is unremarkable except he typically has mild intermittent elevations of his alk phos and bilirubin. His LDH is normal. His exam is unremarkable without any signs of disease recurrence.   The patient was seen with Dr. Julien Nordmann today.  Dr. Julien Nordmann recommends that the patient continue on observation with repeat blood work in 1 year  The patient was advised to call immediately if he has any concerning symptoms in the interval. The patient voices understanding of current disease status and treatment options and is in agreement with the current care plan. All questions were answered. The  patient knows to call the clinic with any problems, questions or concerns. We can certainly see the patient much sooner if necessary       Orders Placed This Encounter  Procedures   CBC with Differential ([facility] Only)    Standing Status:   Future    Standing Expiration Date:   02/22/2022   CMP ([facility] only)  Standing Status:   Future    Standing Expiration Date:   02/22/2022   Lactate dehydrogenase (LDH)    Standing Status:   Future    Standing Expiration Date:   02/22/2022       Tobe Sos Leotis Isham, PA-C 02/22/21  ADDENDUM: Hematology/Oncology Attending: I had a face-to-face encounter with the patient today.  I reviewed his lab and recommended his care plan.  This is a very pleasant 66 years old white male with history of stage III follicular lymphoma status post systemic chemotherapy with R-CHOP followed by maintenance Rituxan for 2 years and he has been in observation since November 2014. The patient is doing fine with no concerning complaints.  He has some toe amputation several months ago because of progressive infection. He has repeat CBC, comprehensive metabolic panel and LDH performed today that were unremarkable except for mild baseline elevated bilirubin. I recommended for the patient to continue on observation with repeat blood work in 1 year. He was advised to call immediately if he has any concerning symptoms in the interval. Disclaimer: This note was dictated with voice recognition software. Similar sounding words can inadvertently be transcribed and may be missed upon review. Eilleen Kempf, MD 02/22/21

## 2021-02-22 ENCOUNTER — Inpatient Hospital Stay (HOSPITAL_BASED_OUTPATIENT_CLINIC_OR_DEPARTMENT_OTHER): Payer: Medicare Other | Admitting: Physician Assistant

## 2021-02-22 ENCOUNTER — Other Ambulatory Visit: Payer: Self-pay

## 2021-02-22 ENCOUNTER — Inpatient Hospital Stay: Payer: Medicare Other | Attending: Physician Assistant

## 2021-02-22 VITALS — BP 137/90 | HR 59 | Temp 97.3°F | Resp 17 | Wt 339.2 lb

## 2021-02-22 DIAGNOSIS — C8212 Follicular lymphoma grade II, intrathoracic lymph nodes: Secondary | ICD-10-CM

## 2021-02-22 DIAGNOSIS — I4891 Unspecified atrial fibrillation: Secondary | ICD-10-CM | POA: Diagnosis not present

## 2021-02-22 DIAGNOSIS — C8590 Non-Hodgkin lymphoma, unspecified, unspecified site: Secondary | ICD-10-CM | POA: Diagnosis not present

## 2021-02-22 DIAGNOSIS — Z7901 Long term (current) use of anticoagulants: Secondary | ICD-10-CM | POA: Diagnosis not present

## 2021-02-22 DIAGNOSIS — Z8572 Personal history of non-Hodgkin lymphomas: Secondary | ICD-10-CM | POA: Insufficient documentation

## 2021-02-22 DIAGNOSIS — Z9221 Personal history of antineoplastic chemotherapy: Secondary | ICD-10-CM | POA: Insufficient documentation

## 2021-02-22 LAB — CBC WITH DIFFERENTIAL (CANCER CENTER ONLY)
Abs Immature Granulocytes: 0.02 10*3/uL (ref 0.00–0.07)
Basophils Absolute: 0.1 10*3/uL (ref 0.0–0.1)
Basophils Relative: 1 %
Eosinophils Absolute: 0.3 10*3/uL (ref 0.0–0.5)
Eosinophils Relative: 4 %
HCT: 41.6 % (ref 39.0–52.0)
Hemoglobin: 13.9 g/dL (ref 13.0–17.0)
Immature Granulocytes: 0 %
Lymphocytes Relative: 19 %
Lymphs Abs: 1.3 10*3/uL (ref 0.7–4.0)
MCH: 31.2 pg (ref 26.0–34.0)
MCHC: 33.4 g/dL (ref 30.0–36.0)
MCV: 93.3 fL (ref 80.0–100.0)
Monocytes Absolute: 0.8 10*3/uL (ref 0.1–1.0)
Monocytes Relative: 11 %
Neutro Abs: 4.3 10*3/uL (ref 1.7–7.7)
Neutrophils Relative %: 65 %
Platelet Count: 169 10*3/uL (ref 150–400)
RBC: 4.46 MIL/uL (ref 4.22–5.81)
RDW: 15.5 % (ref 11.5–15.5)
WBC Count: 6.8 10*3/uL (ref 4.0–10.5)
nRBC: 0 % (ref 0.0–0.2)

## 2021-02-22 LAB — CMP (CANCER CENTER ONLY)
ALT: 18 U/L (ref 0–44)
AST: 22 U/L (ref 15–41)
Albumin: 4 g/dL (ref 3.5–5.0)
Alkaline Phosphatase: 153 U/L — ABNORMAL HIGH (ref 38–126)
Anion gap: 9 (ref 5–15)
BUN: 22 mg/dL (ref 8–23)
CO2: 28 mmol/L (ref 22–32)
Calcium: 9.4 mg/dL (ref 8.9–10.3)
Chloride: 102 mmol/L (ref 98–111)
Creatinine: 1.04 mg/dL (ref 0.61–1.24)
GFR, Estimated: >60 mL/min (ref >60)
Glucose, Bld: 92 mg/dL (ref 70–99)
Potassium: 3.8 mmol/L (ref 3.5–5.1)
Sodium: 139 mmol/L (ref 135–145)
Total Bilirubin: 1.6 mg/dL — ABNORMAL HIGH (ref 0.3–1.2)
Total Protein: 7.3 g/dL (ref 6.5–8.1)

## 2021-02-22 LAB — LACTATE DEHYDROGENASE: LDH: 186 U/L (ref 98–192)

## 2021-03-01 ENCOUNTER — Encounter: Payer: Self-pay | Admitting: Podiatry

## 2021-03-01 ENCOUNTER — Other Ambulatory Visit: Payer: Self-pay

## 2021-03-01 ENCOUNTER — Ambulatory Visit
Admission: RE | Admit: 2021-03-01 | Discharge: 2021-03-01 | Disposition: A | Discharge status: Home / Self Care | Payer: Medicare Other | Source: Ambulatory Visit | Attending: Internal Medicine | Admitting: Internal Medicine

## 2021-03-01 ENCOUNTER — Ambulatory Visit (INDEPENDENT_AMBULATORY_CARE_PROVIDER_SITE_OTHER): Payer: Medicare Other | Admitting: Podiatry

## 2021-03-01 DIAGNOSIS — Z87891 Personal history of nicotine dependence: Secondary | ICD-10-CM | POA: Diagnosis not present

## 2021-03-01 DIAGNOSIS — F17201 Nicotine dependence, unspecified, in remission: Secondary | ICD-10-CM

## 2021-03-01 DIAGNOSIS — Z8572 Personal history of non-Hodgkin lymphomas: Secondary | ICD-10-CM | POA: Diagnosis not present

## 2021-03-01 DIAGNOSIS — I2510 Atherosclerotic heart disease of native coronary artery without angina pectoris: Secondary | ICD-10-CM | POA: Diagnosis not present

## 2021-03-01 DIAGNOSIS — J849 Interstitial pulmonary disease, unspecified: Secondary | ICD-10-CM | POA: Diagnosis not present

## 2021-03-01 DIAGNOSIS — L97522 Non-pressure chronic ulcer of other part of left foot with fat layer exposed: Secondary | ICD-10-CM

## 2021-03-01 DIAGNOSIS — L97511 Non-pressure chronic ulcer of other part of right foot limited to breakdown of skin: Secondary | ICD-10-CM | POA: Diagnosis not present

## 2021-03-01 NOTE — Progress Notes (Unsigned)
[facility] [place]  ASSESSMENT / PLAN: [name] is a 66 y.o. male with atherosclerosis of *** native arteries of *** causing {Chronic PAD levels:25303}.  Patient counseled {pad risk2:26283}  WIfI score calculated based on clinical exam and non-invasive measurements. {WIFIvascular:26096}  Recommend the following which can slow the progression of atherosclerosis and reduce the risk of major adverse cardiac / limb events:  Complete cessation from all tobacco products. Blood glucose control with goal A1c < 7%. Blood pressure control with goal blood pressure < 140/90 mmHg. Lipid reduction therapy with goal LDL-C <100 mg/dL (<70 if symptomatic from PAD).  Aspirin 81mg  PO QD.  *** Clopidogrel 75mg  PO QD. *** Rivaroxaban 2.5mg  PO BID. *** Cilostozal 100mg  PO BID for intermittent claudication without evidence of heart failure. Atorvastatin 40-80mg  PO QD (or other "high intensity" statin therapy). *** Daily walking to and past the point of discomfort. Patient counseled to keep a log of exercise distance. *** Adequate hydration (at least 2 liters / day) if patient's heart and kidney function is adequate.  Plan *** lower extremity angiogram with possible intervention via *** approach in cath lab ***.    CHIEF COMPLAINT: ***  HISTORY OF PRESENT ILLNESS: [name] is a 66 y.o. male ***  VASCULAR SURGICAL HISTORY: ***  VASCULAR RISK FACTORS: {FINDINGS; POSITIVE NEGATIVE:435-410-2110} history of stroke / transient ischemic attack. {FINDINGS; POSITIVE NEGATIVE:435-410-2110} history of coronary artery disease. *** history of PCI. *** history of CABG.  {FINDINGS; POSITIVE NEGATIVE:435-410-2110} history of diabetes mellitus. Last A1c ***. {FINDINGS; POSITIVE NEGATIVE:435-410-2110} history of smoking. *** actively smoking. {FINDINGS; POSITIVE NEGATIVE:435-410-2110} history of hypertension. *** drug regimen with *** control. {FINDINGS; POSITIVE NEGATIVE:435-410-2110} history of  chronic kidney disease.  Last GFR ***. CKD {stage:30421363}. {FINDINGS; POSITIVE NEGATIVE:435-410-2110} history of chronic obstructive pulmonary disease, treated with ***.  FUNCTIONAL STATUS: ECOG performance status: {findings; ecog performance status:31780} Ambulatory status: {TNHAmbulation:25868}  Past Medical History:  Diagnosis Date   Acute systolic HF (heart failure) (HCC)    Arthritis    lt foot, hips knees    Atrial fibrillation ([place])    Diabetes mellitus, new onset ([place])    Diverticulosis    Dyslipidemia    Gout attack 01/16/2012   Hypertension    Internal hemorrhoids    Lymphoma ([place])    remission for about 2 years, chemo 3 years prior   Pneumonia due to COVID-19 virus 07/2018   Pulmonary hypertension ([place])    Sepsis ([place]) 02/2017   secondary to influenza; requiring trach   Tubular adenoma of colon    Venous stasis ulcers ([place])     Past Surgical History:  Procedure Laterality Date   ABDOMINAL AORTOGRAM W/LOWER EXTREMITY N/A 08/14/2020   Procedure: ABDOMINAL AORTOGRAM W/LOWER EXTREMITY;  Surgeon: Elam Dutch, MD;  Location: [facility];  Service: Cardiovascular;  Laterality: N/A;   AMPUTATION TOE Left 05/08/2019   Procedure: AMPUTATION LEFT GREAT  TOE;  Surgeon: Trula Slade, DPM;  Location: [facility];  Service: Podiatry;  Laterality: Left;   BRONCHOSCOPY     ESOPHAGOGASTRODUODENOSCOPY ENDOSCOPY     Dr Royden Purl HERNIA REPAIR Left    2001   IR REMOVAL TUN ACCESS W/ PORT W/O FL MOD SED  02/03/2020   PORTA CATH INSERTION  2011   TRACHEOSTOMY  03/2017   with decannulation   TRANSMETATARSAL AMPUTATION Left 08/05/2020   Procedure: TRANSMETATARSAL AMPUTATION;  Surgeon: Trula Slade, DPM;  Location: [facility];  Service: Podiatry;  Laterality: Left;  Family History  Problem Relation Age of Onset   Dementia Brother 85       frontal lobe    Dementia Mother    Arthritis Mother    Hypertension Mother    CVA Sister 102   Colon cancer Neg Hx     Pancreatic cancer Neg Hx    Stomach cancer Neg Hx    Rectal cancer Neg Hx    Liver cancer Neg Hx     Social History   Socioeconomic History   Marital status: Divorced    Spouse name: Not on file   Number of children: Not on file   Years of education: Not on file   Highest education level: Not on file  Occupational History   Occupation: cosmetologist  Tobacco Use   Smoking status: Former    Packs/day: 0.50    Years: 20.00    Pack years: 10.00    Types: Cigarettes    Quit date: 01/10/2009    Years since quitting: 12.1   Smokeless tobacco: Never  Vaping Use   Vaping Use: Never used  Substance and Sexual Activity   Alcohol use: Not Currently    Comment: 2-3 times per week   Drug use: No   Sexual activity: Not on file  Other Topics Concern   Not on file  Social History Narrative   Not on file   Social Determinants of Health   Financial Resource Strain: Not on file  Food Insecurity: Not on file  Transportation Needs: Not on file  Physical Activity: Not on file  Stress: Not on file  Social Connections: Not on file  Intimate Partner Violence: Not on file    Allergies  Allergen Reactions   Latex Hives and Swelling    Current Outpatient Medications  Medication Sig Dispense Refill   acetaminophen (TYLENOL) 325 MG tablet Take 650 mg by mouth every 6 (six) hours as needed for moderate pain.      albuterol (VENTOLIN HFA) 108 (90 Base) MCG/ACT inhaler Inhale 2 puffs into the lungs every 6 (six) hours as needed for wheezing or shortness of breath. 1 each 2   apixaban (ELIQUIS) 5 MG TABS tablet Take 1 tablet (5 mg total) by mouth 2 (two) times daily. 180 tablet 1   ascorbic acid (VITAMIN C) 500 MG tablet Take 1 tablet (500 mg total) by mouth 3 (three) times daily. 30 tablet 0   atorvastatin (LIPITOR) 20 MG tablet TAKE 1 TABLET BY MOUTH EVERY DAY *PT OVERDUE FOR OFFICE VISIT* 30 tablet 5   cholecalciferol (VITAMIN D) 25 MCG (1000 UNIT) tablet Take 1 tablet (1,000 Units total)  by mouth daily. 30 tablet 0   colchicine 0.6 MG tablet Take 0.6 mg by mouth 2 (two) times daily as needed.     Continuous Blood Gluc Receiver (FREESTYLE LIBRE 2 READER) DEVI 1 each by Does not apply route daily. (Patient not taking: Reported on 02/22/2021) 1 each 3   Continuous Blood Gluc Sensor (FREESTYLE LIBRE 2 SENSOR) MISC 1 each by Does not apply route daily. (Patient not taking: Reported on 02/22/2021) 1 each 3   KLOR-CON M20 20 MEQ tablet Take 1 tablet (20 mEq total) by mouth daily. 90 tablet 1   losartan (COZAAR) 50 MG tablet Take 1 tablet (50 mg total) by mouth daily. 30 tablet 0   metFORMIN (GLUCOPHAGE) 500 MG tablet Take 1 tablet (500 mg total) by mouth daily with breakfast. (Patient not taking: Reported on 02/22/2021) 30 tablet 11   nitroGLYCERIN (  NITRODUR - DOSED IN MG/24 HR) 0.1 mg/hr patch Place 1 patch (0.1 mg total) onto the skin daily. 30 patch 12   Omega 3 340 MG CPDR Take 1 capsule (340 mg total) by mouth 2 (two) times daily. 60 capsule 0   omeprazole (PRILOSEC) 40 MG capsule Take 1 capsule (40 mg total) by mouth daily. 30 capsule 0   polycarbophil (FIBERCON) 625 MG tablet Take 1 tablet (625 mg total) by mouth daily. 30 tablet 0   sertraline (ZOLOFT) 50 MG tablet Take 1 tablet (50 mg total) by mouth daily. 30 tablet 0   torsemide (DEMADEX) 20 MG tablet Take 1 tablet (20 mg total) by mouth daily. 90 tablet 2   vitamin B-12 (CYANOCOBALAMIN) 1000 MCG tablet Take 1 tablet (1,000 mcg total) by mouth daily. 30 tablet 0   No current facility-administered medications for this visit.   Facility-Administered Medications Ordered in Other Visits  Medication Dose Route Frequency Provider Last Rate Last Admin   sodium chloride 0.9 % injection 10 mL  10 mL Intravenous PRN Curt Bears, MD   10 mL at 05/04/15 1529    PHYSICAL EXAM There were no vitals filed for this visit.  Constitutional: *** appearing. *** distress. Appears *** nourished.  Neurologic: CN ***. *** focal findings. ***  sensory loss. Psychiatric: *** Mood and affect symmetric and appropriate. Eyes: *** No icterus. No conjunctival pallor. Ears, nose, throat: *** mucous membranes moist. Midline trachea.  Cardiac: *** rate and rhythm.  Respiratory: *** unlabored. Abdominal: *** soft, non-tender, non-distended.  Peripheral vascular: *** Extremity: *** edema. *** cyanosis. *** pallor.  Skin: *** gangrene. *** ulceration.  Lymphatic: *** Stemmer's sign. *** palpable lymphadenopathy.  PERTINENT LABORATORY AND RADIOLOGIC DATA  Most recent CBC CBC Latest Ref Rng & Units 02/22/2021 08/24/2020 08/19/2020  WBC 4.0 - 10.5 K/uL 6.8 6.4 9.7  Hemoglobin 13.0 - 17.0 g/dL 13.9 13.8 13.0  Hematocrit 39.0 - 52.0 % 41.6 43.6 39.7  Platelets 150 - 400 K/uL 169 291 280     Most recent CMP CMP Latest Ref Rng & Units 02/22/2021 08/24/2020 08/19/2020  Glucose 70 - 99 mg/dL 92 141(H) 110(H)  BUN 8 - 23 mg/dL 22 21 17   Creatinine 0.61 - 1.24 mg/dL 1.04 1.05 0.91  Sodium 135 - 145 mmol/L 139 138 136  Potassium 3.5 - 5.1 mmol/L 3.8 3.8 4.4  Chloride 98 - 111 mmol/L 102 102 100  CO2 22 - 32 mmol/L 28 28 28   Calcium 8.9 - 10.3 mg/dL 9.4 9.5 9.2  Total Protein 6.5 - 8.1 g/dL 7.3 - 7.1  Total Bilirubin 0.3 - 1.2 mg/dL 1.6(H) - 1.2  Alkaline Phos 38 - 126 U/L 153(H) - 135(H)  AST 15 - 41 U/L 22 - 32  ALT 0 - 44 U/L 18 - 28    Renal function Estimated Creatinine Clearance: 106.9 mL/min (by C-G formula based on SCr of 1.04 mg/dL).  Hgb A1c MFr Bld (%)  Date Value  08/04/2020 6.4 (H)    No results found for: LDLCALC, LDLC, HIRISKLDL, POCLDL, LDLDIRECT, REALLDLC, TOTLDLC   Vascular Imaging: ***  Cailen Texeira N. Stanford Breed, MD [facility] Phone Number: [phone] 03/01/2021 7:36 PM  Total time spent on preparing this encounter including chart review, data review, collecting history, examining the patient, coordinating care for this {tnhtimebilling:26202}  Portions of this report may have  been transcribed using voice recognition software.  Every effort has been made to ensure accuracy; however, inadvertent computerized transcription errors  may still be present.

## 2021-03-02 ENCOUNTER — Encounter: Payer: Medicare Other | Admitting: Vascular Surgery

## 2021-03-06 NOTE — Progress Notes (Signed)
Subjective: [name] is a 66 y.o. is seen today in office s/p left transmetatarsal amputation preformed on 08/05/2020 which resulted in dehiscence.  He states he has been doing better.  On the left side he is not seeing very pus in the area has callused over.  On the right side he states the wound is also doing better.  Denies any drainage or pus.  No fevers or chills.  No other concerns today.   Objective: General: No acute distress, AAOx3  Left foot: On the central aspect is a hyperkeratotic lesion.  After debridement the wound appears to be healed.  There is no drainage or pus or any open sores.  No fluctuation or crepitation.  There is no malodor. Right foot: Along the second and third digit superficial granular wound present with fibrotic tissue also noted.   There is macerated tissue.  There is no surrounding erythema, ascending cellulitis.  No fluctuation or crepitation but there is no malodor.   No pain with calf compression, swelling, warmth, erythema.   (Pictures did not save)   Assessment and Plan:  Status post left foot surgery, healing although slowly; right foot toe ulceration.   -Treatment options discussed including all alternatives, risks, and complications -The left side sharply debrided the hyperkeratotic tissue without any complications or bleeding.  It appears that the ulcer is healed but still need to keep close monitoring of this. -Debrided the wound on the right side.  Continue small mount of Betadine to the wounds daily.  Been using antiperspirant sprays to help with the moisture control which has been helping as well. -He has follow-up with wound care center as well as vascular surgery scheduled. -Monitor for any clinical signs or symptoms of infection and directed to call the office immediately should any occur or go to the ER.  Trula Slade DPM

## 2021-03-08 DIAGNOSIS — E1151 Type 2 diabetes mellitus with diabetic peripheral angiopathy without gangrene: Secondary | ICD-10-CM | POA: Insufficient documentation

## 2021-03-08 DIAGNOSIS — S98139A Complete traumatic amputation of one unspecified lesser toe, initial encounter: Secondary | ICD-10-CM | POA: Insufficient documentation

## 2021-03-08 DIAGNOSIS — D6859 Other primary thrombophilia: Secondary | ICD-10-CM | POA: Insufficient documentation

## 2021-03-08 NOTE — Progress Notes (Deleted)
[facility] [place]  ASSESSMENT / PLAN: [name] is a 66 y.o. male with atherosclerosis of *** native arteries of *** causing {Chronic PAD levels:25303}.  Patient counseled {pad risk2:26283}  WIfI score calculated based on clinical exam and non-invasive measurements. {WIFIvascular:26096}  Recommend the following which can slow the progression of atherosclerosis and reduce the risk of major adverse cardiac / limb events:  Complete cessation from all tobacco products. Blood glucose control with goal A1c < 7%. Blood pressure control with goal blood pressure < 140/90 mmHg. Lipid reduction therapy with goal LDL-C <100 mg/dL (<70 if symptomatic from PAD).  Aspirin 81mg  PO QD.  *** Clopidogrel 75mg  PO QD. *** Rivaroxaban 2.5mg  PO BID. *** Cilostozal 100mg  PO BID for intermittent claudication without evidence of heart failure. Atorvastatin 40-80mg  PO QD (or other "high intensity" statin therapy). *** Daily walking to and past the point of discomfort. Patient counseled to keep a log of exercise distance. *** Adequate hydration (at least 2 liters / day) if patient's heart and kidney function is adequate.  Plan *** lower extremity angiogram with possible intervention via *** approach in cath lab ***.    CHIEF COMPLAINT: ***  HISTORY OF PRESENT ILLNESS: [name] is a 66 y.o. male ***  VASCULAR SURGICAL HISTORY: ***  VASCULAR RISK FACTORS: {FINDINGS; POSITIVE [phone]} history of stroke / transient ischemic attack. {FINDINGS; POSITIVE NEGATIVE:([phone]} history of coronary artery disease. *** history of PCI. *** history of CABG.  {FINDINGS; POSITIVE NEGATIVE:([phone]} history of diabetes mellitus. Last A1c ***. {FINDINGS; POSITIVE NEGATIVE:([phone]} history of smoking. *** actively smoking. {FINDINGS; POSITIVE NEGATIVE:([phone]} history of hypertension. *** drug regimen with *** control. {FINDINGS; POSITIVE NEGATIVE:([phone]} history of  chronic kidney disease.  Last GFR ***. CKD {stage:30421363}. {FINDINGS; POSITIVE NEGATIVE:([phone]} history of chronic obstructive pulmonary disease, treated with ***.  FUNCTIONAL STATUS: ECOG performance status: {findings; ecog performance status:31780} Ambulatory status: {TNHAmbulation:25868}  Past Medical History:  Diagnosis Date   Acute systolic HF (heart failure) (HCC)    Arthritis    lt foot, hips knees    Atrial fibrillation ([place])    Diabetes mellitus, new onset ([place])    Diverticulosis    Dyslipidemia    Gout attack 01/16/2012   Hypertension    Internal hemorrhoids    Lymphoma ([place])    remission for about 2 years, chemo 3 years prior   Pneumonia due to COVID-19 virus 07/2018   Pulmonary hypertension ([place])    Sepsis ([place]) 02/2017   secondary to influenza; requiring trach   Tubular adenoma of colon    Venous stasis ulcers ([place])     Past Surgical History:  Procedure Laterality Date   ABDOMINAL AORTOGRAM W/LOWER EXTREMITY N/A 08/14/2020   Procedure: ABDOMINAL AORTOGRAM W/LOWER EXTREMITY;  Surgeon: Elam Dutch, MD;  Location: [facility];  Service: Cardiovascular;  Laterality: N/A;   AMPUTATION TOE Left 05/08/2019   Procedure: AMPUTATION LEFT GREAT  TOE;  Surgeon: Trula Slade, DPM;  Location: [facility];  Service: Podiatry;  Laterality: Left;   BRONCHOSCOPY     ESOPHAGOGASTRODUODENOSCOPY ENDOSCOPY     Dr Royden Purl HERNIA REPAIR Left    2001   IR REMOVAL TUN ACCESS W/ PORT W/O FL MOD SED  02/03/2020   PORTA CATH INSERTION  2011   TRACHEOSTOMY  03/2017   with decannulation   TRANSMETATARSAL AMPUTATION Left 08/05/2020   Procedure: TRANSMETATARSAL AMPUTATION;  Surgeon: Trula Slade, DPM;  Location: [facility];  Service: Podiatry;  Laterality: Left;  Family History  Problem Relation Age of Onset   Dementia Brother 70       frontal lobe    Dementia Mother    Arthritis Mother    Hypertension Mother    CVA Sister 58   Colon cancer Neg Hx     Pancreatic cancer Neg Hx    Stomach cancer Neg Hx    Rectal cancer Neg Hx    Liver cancer Neg Hx     Social History   Socioeconomic History   Marital status: Divorced    Spouse name: Not on file   Number of children: Not on file   Years of education: Not on file   Highest education level: Not on file  Occupational History   Occupation: cosmetologist  Tobacco Use   Smoking status: Former    Packs/day: 0.50    Years: 20.00    Pack years: 10.00    Types: Cigarettes    Quit date: 01/10/2009    Years since quitting: 12.1   Smokeless tobacco: Never  Vaping Use   Vaping Use: Never used  Substance and Sexual Activity   Alcohol use: Not Currently    Comment: 2-3 times per week   Drug use: No   Sexual activity: Not on file  Other Topics Concern   Not on file  Social History Narrative   Not on file   Social Determinants of Health   Financial Resource Strain: Not on file  Food Insecurity: Not on file  Transportation Needs: Not on file  Physical Activity: Not on file  Stress: Not on file  Social Connections: Not on file  Intimate Partner Violence: Not on file    Allergies  Allergen Reactions   Latex Hives and Swelling    Current Outpatient Medications  Medication Sig Dispense Refill   acetaminophen (TYLENOL) 325 MG tablet Take 650 mg by mouth every 6 (six) hours as needed for moderate pain.      albuterol (VENTOLIN HFA) 108 (90 Base) MCG/ACT inhaler Inhale 2 puffs into the lungs every 6 (six) hours as needed for wheezing or shortness of breath. 1 each 2   apixaban (ELIQUIS) 5 MG TABS tablet Take 1 tablet (5 mg total) by mouth 2 (two) times daily. 180 tablet 1   ascorbic acid (VITAMIN C) 500 MG tablet Take 1 tablet (500 mg total) by mouth 3 (three) times daily. 30 tablet 0   atorvastatin (LIPITOR) 20 MG tablet TAKE 1 TABLET BY MOUTH EVERY DAY *PT OVERDUE FOR OFFICE VISIT* 30 tablet 5   cholecalciferol (VITAMIN D) 25 MCG (1000 UNIT) tablet Take 1 tablet (1,000 Units total)  by mouth daily. 30 tablet 0   colchicine 0.6 MG tablet Take 0.6 mg by mouth 2 (two) times daily as needed.     Continuous Blood Gluc Receiver (FREESTYLE LIBRE 2 READER) DEVI 1 each by Does not apply route daily. (Patient not taking: Reported on 02/22/2021) 1 each 3   Continuous Blood Gluc Sensor (FREESTYLE LIBRE 2 SENSOR) MISC 1 each by Does not apply route daily. (Patient not taking: Reported on 02/22/2021) 1 each 3   KLOR-CON M20 20 MEQ tablet Take 1 tablet (20 mEq total) by mouth daily. 90 tablet 1   losartan (COZAAR) 50 MG tablet Take 1 tablet (50 mg total) by mouth daily. 30 tablet 0   metFORMIN (GLUCOPHAGE) 500 MG tablet Take 1 tablet (500 mg total) by mouth daily with breakfast. (Patient not taking: Reported on 02/22/2021) 30 tablet 11   nitroGLYCERIN (  NITRODUR - DOSED IN MG/24 HR) 0.1 mg/hr patch Place 1 patch (0.1 mg total) onto the skin daily. 30 patch 12   Omega 3 340 MG CPDR Take 1 capsule (340 mg total) by mouth 2 (two) times daily. 60 capsule 0   omeprazole (PRILOSEC) 40 MG capsule Take 1 capsule (40 mg total) by mouth daily. 30 capsule 0   polycarbophil (FIBERCON) 625 MG tablet Take 1 tablet (625 mg total) by mouth daily. 30 tablet 0   sertraline (ZOLOFT) 50 MG tablet Take 1 tablet (50 mg total) by mouth daily. 30 tablet 0   torsemide (DEMADEX) 20 MG tablet Take 1 tablet (20 mg total) by mouth daily. 90 tablet 2   vitamin B-12 (CYANOCOBALAMIN) 1000 MCG tablet Take 1 tablet (1,000 mcg total) by mouth daily. 30 tablet 0   No current facility-administered medications for this visit.   Facility-Administered Medications Ordered in Other Visits  Medication Dose Route Frequency Provider Last Rate Last Admin   sodium chloride 0.9 % injection 10 mL  10 mL Intravenous PRN Curt Bears, MD   10 mL at 05/04/15 1529    PHYSICAL EXAM There were no vitals filed for this visit.  Constitutional: *** appearing. *** distress. Appears *** nourished.  Neurologic: CN ***. *** focal findings. ***  sensory loss. Psychiatric: *** Mood and affect symmetric and appropriate. Eyes: *** No icterus. No conjunctival pallor. Ears, nose, throat: *** mucous membranes moist. Midline trachea.  Cardiac: *** rate and rhythm.  Respiratory: *** unlabored. Abdominal: *** soft, non-tender, non-distended.  Peripheral vascular: *** Extremity: *** edema. *** cyanosis. *** pallor.  Skin: *** gangrene. *** ulceration.  Lymphatic: *** Stemmer's sign. *** palpable lymphadenopathy.  PERTINENT LABORATORY AND RADIOLOGIC DATA  Most recent CBC CBC Latest Ref Rng & Units 02/22/2021 08/24/2020 08/19/2020  WBC 4.0 - 10.5 K/uL 6.8 6.4 9.7  Hemoglobin 13.0 - 17.0 g/dL 13.9 13.8 13.0  Hematocrit 39.0 - 52.0 % 41.6 43.6 39.7  Platelets 150 - 400 K/uL 169 291 280     Most recent CMP CMP Latest Ref Rng & Units 02/22/2021 08/24/2020 08/19/2020  Glucose 70 - 99 mg/dL 92 141(H) 110(H)  BUN 8 - 23 mg/dL 22 21 17   Creatinine 0.61 - 1.24 mg/dL 1.04 1.05 0.91  Sodium 135 - 145 mmol/L 139 138 136  Potassium 3.5 - 5.1 mmol/L 3.8 3.8 4.4  Chloride 98 - 111 mmol/L 102 102 100  CO2 22 - 32 mmol/L 28 28 28   Calcium 8.9 - 10.3 mg/dL 9.4 9.5 9.2  Total Protein 6.5 - 8.1 g/dL 7.3 - 7.1  Total Bilirubin 0.3 - 1.2 mg/dL 1.6(H) - 1.2  Alkaline Phos 38 - 126 U/L 153(H) - 135(H)  AST 15 - 41 U/L 22 - 32  ALT 0 - 44 U/L 18 - 28    Renal function CrCl cannot be calculated (Unknown ideal weight.).  Hgb A1c MFr Bld (%)  Date Value  08/04/2020 6.4 (H)    No results found for: LDLCALC, LDLC, HIRISKLDL, POCLDL, LDLDIRECT, REALLDLC, TOTLDLC   Vascular Imaging: ***  Florentino Laabs N. Stanford Breed, MD [facility] Phone Number: [phone] 03/08/2021 4:33 PM  Total time spent on preparing this encounter including chart review, data review, collecting history, examining the patient, coordinating care for this {tnhtimebilling:26202}  Portions of this report may have been transcribed using voice  recognition software.  Every effort has been made to ensure accuracy; however, inadvertent computerized transcription errors may still be present.

## 2021-03-09 ENCOUNTER — Encounter: Payer: Medicare Other | Admitting: Vascular Surgery

## 2021-03-16 ENCOUNTER — Telehealth: Payer: Self-pay

## 2021-03-16 NOTE — Telephone Encounter (Signed)
Casts sent to central fabrication °

## 2021-03-19 NOTE — Progress Notes (Addendum)
[name] ([id number]) , Visit Report for 03/22/2021 Allergy List Details Patient Name: Date of Service: [name], [name] 03/22/2021 1:15 PM Medical Record Number: [id number] Patient Account Number: [id number] Date of Birth/Sex: Treating RN: 1955/06/06 (66 y.o. M) Primary Care Ronit Marczak: [name] Other Clinician: Referring Azhane Eckart: Treating Anusha Claus/Extender: Fransico Michael in Treatment: 0 Allergies Active Allergies latex Reaction: Hives, Swelling Severity: Severe Active: 04/12/2017 ACE Inhibitors Reaction: Not Specified Severity: Severe Active: 10/16/2020 Allergy Notes Electronic Signature(s) Signed: 03/22/2021 5:11:36 PM By: Dellie Catholic RN Previous Signature: 03/19/2021 1:02:27 PM Version By: Donavan Burnet CHT EMT BS , , Previous Signature: 03/19/2021 1:01:11 PM Version By: Donavan Burnet CHT EMT BS , , Entered By: Dellie Catholic on 03/22/2021 14:27:35 -------------------------------------------------------------------------------- Arrival Information Details Patient Name: Date of Service: [name], [name] 03/22/2021 1:15 PM Medical Record Number: [id number] Patient Account Number: [id number] Date of Birth/Sex: Treating RN: 09/01/1955 (66 y.o. Collene Gobble Primary Care Kenyada Hy: [name] Other Clinician: Referring Sherlynn Tourville: Treating Mikyle Sox/Extender: Betsy Pries in Treatment: 0 Visit Information Patient Arrived: Charlyn Minerva Time: 14:20 Accompanied By: alone Transfer Assistance: None History Since Last Visit Added or deleted any medications: No Any new allergies or adverse reactions: No Had a fall or experienced change in activities of daily living that may affect risk of falls: No Signs or symptoms of abuse/neglect since last visito No Hospitalized since last visit: No Implantable device outside of the clinic excluding cellular tissue based products placed in the center since last visit:  No Electronic Signature(s) Signed: 03/22/2021 5:11:36 PM By: Dellie Catholic RN Entered By: Dellie Catholic on 03/22/2021 14:25:28 -------------------------------------------------------------------------------- Encounter Discharge Information Details Patient Name: Date of Service: [name], [name] 03/22/2021 1:15 PM Medical Record Number: [id number] Patient Account Number: [id number] Date of Birth/Sex: Treating RN: 03-15-1955 (66 y.o. Collene Gobble Primary Care Elpidio Thielen: [name] Other Clinician: Referring Graycie Halley: Treating Suezette Lafave/Extender: Betsy Pries in Treatment: 0 Encounter Discharge Information Items Post Procedure Vitals Discharge Condition: Stable Temperature (F): 98.5 Ambulatory Status: Wheelchair Pulse (bpm): 74 Discharge Destination: Home Respiratory Rate (breaths/min): 20 Transportation: Private Auto Blood Pressure (mmHg): 162/80 Accompanied By: self Schedule Follow-up Appointment: Yes Clinical Summary of Care: Electronic Signature(s) Signed: 03/22/2021 5:11:36 PM By: Dellie Catholic RN Entered By: Dellie Catholic on 03/22/2021 17:10:50 -------------------------------------------------------------------------------- Lower Extremity Assessment Details Patient Name: Date of Service: [name] 03/22/2021 1:15 PM Medical Record Number: [id number] Patient Account Number: [id number] Date of Birth/Sex: Treating RN: [birth year] (66 y.o. Collene Gobble Primary Care Yashas Camilli: [name] Other Clinician: Referring Alyce Inscore: Treating Erlene Devita/Extender: Betsy Pries in Treatment: 0 Edema Assessment Assessed: [name]: No] [name]: No] E[Left: dema] [Right: :] Calf Left: Right: Point of Measurement: From Medial Instep 44 cm Ankle Left: Right: Point of Measurement: From Medial Instep 29 cm Electronic Signature(s) Signed: 03/22/2021 5:11:36 PM By: Dellie Catholic RN Entered By: Dellie Catholic on  03/22/2021 14:48:32 -------------------------------------------------------------------------------- Multi-Disciplinary Care Plan Details Patient Name: Date of Service: [name], [name] 03/22/2021 1:15 PM Medical Record Number: [id number] Patient Account Number: [id number] Date of Birth/Sex: Treating RN: [birth year] (66 y.o. Collene Gobble Primary Care Debralee Braaksma: [name] Other Clinician: Referring Jaydah Stahle: Treating Ramal Eckhardt/Extender: Betsy Pries in Treatment: 0 Active Inactive Wound/Skin Impairment Nursing Diagnoses: Impaired tissue integrity Knowledge deficit related to ulceration/compromised skin integrity Goals: Patient/caregiver will verbalize understanding of skin care regimen Date Initiated: 03/22/2021 Target Resolution Date: 04/22/2021 Goal Status: Active Interventions: Assess patient/caregiver ability to obtain necessary  supplies Assess patient/caregiver ability to perform ulcer/skin care regimen upon admission and as needed Assess ulceration(s) every visit Treatment Activities: Skin care regimen initiated : 03/22/2021 Notes: Electronic Signature(s) Signed: 03/22/2021 5:11:36 PM By: Dellie Catholic RN Entered By: Dellie Catholic on 03/22/2021 16:54:42 -------------------------------------------------------------------------------- Pain Assessment Details Patient Name: Date of Service: [name], [name] 03/22/2021 1:15 PM Medical Record Number: [id number] Patient Account Number: [id number] Date of Birth/Sex: Treating RN: 03/03/1955 (66 y.o. Collene Gobble Primary Care Sherlyne Crownover: [name] Other Clinician: Referring Bertine Schlottman: Treating Xeng Kucher/Extender: Betsy Pries in Treatment: 0 Active Problems Location of Pain Severity and Description of Pain Patient Has Paino No Site Locations Pain Management and Medication Current Pain Management: Electronic Signature(s) Signed: 03/22/2021 5:11:36 PM By:  Dellie Catholic RN Entered By: Dellie Catholic on 03/22/2021 14:50:59 -------------------------------------------------------------------------------- Patient/Caregiver Education Details Patient Name: Date of Service: [name] 3/13/2023andnbsp1:15 PM Medical Record Number: [id number] Patient Account Number: [id number] Date of Birth/Gender: Treating RN: 1955/09/21 (66 y.o. Collene Gobble Primary Care Physician: [name] Other Clinician: Referring Physician: Treating Physician/Extender: Betsy Pries in Treatment: 0 Education Assessment Education Provided To: Patient Education Topics Provided Wound/Skin Impairment: Methods: Explain/Verbal Responses: Return demonstration correctly Electronic Signature(s) Signed: 03/22/2021 5:11:36 PM By: Dellie Catholic RN Entered By: Dellie Catholic on 03/22/2021 16:54:57 -------------------------------------------------------------------------------- Wound Assessment Details Patient Name: Date of Service: Tyminski, [name] 03/22/2021 1:15 PM Medical Record Number: [id number] Patient Account Number: [id number] Date of Birth/Sex: Treating RN: 1955/07/22 (66 y.o. M) Primary Care Tais Koestner: [name] Other Clinician: Referring Nirali Magouirk: Treating Aadam Zhen/Extender: Betsy Pries in Treatment: 0 Wound Status Wound Number: 5 Primary T be determined o Etiology: Wound Location: Right T Second oe Wound Open Wounding Event: Gradually Appeared Status: Date Acquired: 08/10/2020 Comorbid Sleep Apnea, Arrhythmia, Hypertension, Hypotension, Peripheral Weeks Of Treatment: 0 History: Arterial Disease, Type II Diabetes, Osteoarthritis Clustered Wound: No Photos Wound Measurements Length: (cm) 0.3 Width: (cm) 1.1 Depth: (cm) 0.2 Area: (cm) 0.259 Volume: (cm) 0.052 % Reduction in Area: 0% % Reduction in Volume: 0% Epithelialization: None Tunneling: No Undermining: No Wound  Description Classification: Full Thickness Without Exposed Support Structures Wound Margin: Distinct, outline attached Exudate Amount: Medium Exudate Type: Serosanguineous Exudate Color: red, brown Foul Odor After Cleansing: No Slough/Fibrino Yes Wound Bed Granulation Amount: Large (67-100%) Exposed Structure Granulation Quality: Red Fascia Exposed: No Necrotic Amount: Small (1-33%) Fat Layer (Subcutaneous Tissue) Exposed: Yes Necrotic Quality: Adherent Slough Tendon Exposed: No Muscle Exposed: No Joint Exposed: No Bone Exposed: No Treatment Notes Wound #5 (Toe Second) Wound Laterality: Right Cleanser Soap and Water Discharge Instruction: May shower and wash wound with dial antibacterial soap and water prior to dressing change. Wound Cleanser Discharge Instruction: Cleanse the wound with wound cleanser prior to applying a clean dressing using gauze sponges, not tissue or cotton balls. Peri-Wound Care Topical Primary Dressing KerraCel Ag Gelling Fiber Dressing, 2x2 in (silver alginate) Discharge Instruction: Apply silver alginate to wound bed as instructed Optifoam Non-Adhesive Dressing, 4x4 in Discharge Instruction: Apply to wound bed as instructed Secondary Dressing Woven Gauze Sponges 2x2 in Discharge Instruction: Apply over primary dressing as directed. Secured With 30M Medipore H Soft Cloth Surgical T ape, 4 x 10 (in/yd) Discharge Instruction: Secure with tape as directed. Compression Wrap Compression Stockings Add-Ons Electronic Signature(s) Signed: 03/22/2021 5:11:36 PM By: Dellie Catholic RN Entered By: Dellie Catholic on 03/22/2021 14:50:39 -------------------------------------------------------------------------------- Wound Assessment Details Patient Name: Date of Service: Matthew, [name] 03/22/2021 1:15 PM Medical Record Number:  [id number] Patient Account Number: [id number] Date of Birth/Sex: Treating RN: 1955/11/12 (66 y.o. M) Primary Care Denitra Donaghey: [name] Other Clinician: Referring Shalin Vonbargen: Treating [name] [name]/Extender: Betsy Pries in Treatment: 0 Wound Status Wound Number: 6 Primary T be determined o Etiology: Wound Location: Right T Third oe Wound Open Wounding Event: Gradually Appeared Status: Date Acquired: 08/10/2020 Comorbid Sleep Apnea, Arrhythmia, Hypertension, Hypotension, Peripheral Weeks Of Treatment: 0 History: Arterial Disease, Type II Diabetes, Osteoarthritis Clustered Wound: No Photos Wound Measurements Length: (cm) 0.3 Width: (cm) 0.2 Depth: (cm) 0.1 Area: (cm) 0.047 Volume: (cm) 0.005 % Reduction in Area: 0% % Reduction in Volume: 0% Epithelialization: None Tunneling: No Undermining: No Wound Description Classification: Full Thickness Without Exposed Support Structures Wound Margin: Distinct, outline attached Exudate Amount: Medium Exudate Type: Serosanguineous Exudate Color: red, brown Foul Odor After Cleansing: No Slough/Fibrino Yes Wound Bed Granulation Amount: Large (67-100%) Exposed Structure Granulation Quality: Red Fascia Exposed: No Necrotic Amount: Small (1-33%) Fat Layer (Subcutaneous Tissue) Exposed: Yes Necrotic Quality: Adherent Slough Tendon Exposed: No Muscle Exposed: No Joint Exposed: No Bone Exposed: No Treatment Notes Wound #6 (Toe Third) Wound Laterality: Right Cleanser Peri-Wound Care Topical Primary Dressing Secondary Dressing Secured With Compression Wrap Compression Stockings Add-Ons Electronic Signature(s) Signed: 03/22/2021 5:11:36 PM By: Dellie Catholic RN Entered By: Dellie Catholic on 03/22/2021 14:49:44 -------------------------------------------------------------------------------- Vitals Details Patient Name: Date of Service: Baskin, [name] 03/22/2021 1:15 PM Medical Record Number: [id number] Patient Account Number: [id number] Date of Birth/Sex: Treating RN: 08/10/1955 (66 y.o. Collene Gobble Primary Care  Guenther Dunshee: [name] Other Clinician: Referring Melton Walls: Treating Ala Kratz/Extender: Betsy Pries in Treatment: 0 Vital Signs Time Taken: 14:20 Temperature (F): 98.5 Pulse (bpm): 74 Respiratory Rate (breaths/min): 20 Blood Pressure (mmHg): 162/80 Reference Range: 80 - 120 mg / dl Electronic Signature(s) Signed: 03/22/2021 5:11:36 PM By: Dellie Catholic RN Entered By: Dellie Catholic on 03/22/2021 14:24:31

## 2021-03-22 ENCOUNTER — Encounter: Payer: Medicare Other | Admitting: Podiatry

## 2021-03-22 ENCOUNTER — Encounter (HOSPITAL_BASED_OUTPATIENT_CLINIC_OR_DEPARTMENT_OTHER): Payer: Medicare Other | Attending: Internal Medicine | Admitting: General Surgery

## 2021-03-22 ENCOUNTER — Other Ambulatory Visit: Payer: Self-pay

## 2021-03-22 DIAGNOSIS — I2510 Atherosclerotic heart disease of native coronary artery without angina pectoris: Secondary | ICD-10-CM | POA: Insufficient documentation

## 2021-03-22 DIAGNOSIS — Z899 Acquired absence of limb, unspecified: Secondary | ICD-10-CM | POA: Insufficient documentation

## 2021-03-22 DIAGNOSIS — L97511 Non-pressure chronic ulcer of other part of right foot limited to breakdown of skin: Secondary | ICD-10-CM | POA: Diagnosis not present

## 2021-03-22 DIAGNOSIS — I5022 Chronic systolic (congestive) heart failure: Secondary | ICD-10-CM | POA: Diagnosis not present

## 2021-03-22 DIAGNOSIS — I872 Venous insufficiency (chronic) (peripheral): Secondary | ICD-10-CM | POA: Insufficient documentation

## 2021-03-22 DIAGNOSIS — E1151 Type 2 diabetes mellitus with diabetic peripheral angiopathy without gangrene: Secondary | ICD-10-CM | POA: Diagnosis not present

## 2021-03-22 DIAGNOSIS — E11621 Type 2 diabetes mellitus with foot ulcer: Secondary | ICD-10-CM | POA: Diagnosis not present

## 2021-03-22 DIAGNOSIS — I739 Peripheral vascular disease, unspecified: Secondary | ICD-10-CM | POA: Diagnosis not present

## 2021-03-22 DIAGNOSIS — L97512 Non-pressure chronic ulcer of other part of right foot with fat layer exposed: Secondary | ICD-10-CM | POA: Diagnosis not present

## 2021-03-22 NOTE — Progress Notes (Signed)
[name] [name]. ([id number]) ?, ?Visit Report for 03/22/2021 ?Abuse Risk Screen Details ?Patient Name: Date of Service: ?[name], [name] 03/22/2021 1:15 PM ?Medical Record Number: [id number] ?Patient Account Number: [id number] ?Date of Birth/Sex: Treating RN: ?1955/05/15 (66 y.o. [name]) Dellie Catholic ?Primary Care Bentleigh Stankus: [name] Other Clinician: ?Referring Jaelyne Deeg: ?Treating Janiya Millirons/Extender: Fredirick Maudlin ?Celesta Gentile ?Weeks in Treatment: 0 ?Abuse Risk Screen Items ?Answer ?ABUSE RISK SCREEN: ?Has anyone close to you tried to hurt or harm you recentlyo No ?Do you feel uncomfortable with anyone in your familyo No ?Has anyone forced you do things that you didnt want to doo No ?Electronic Signature(s) ?Signed: 03/22/2021 5:11:36 PM By: Dellie Catholic RN ?Entered By: Dellie Catholic on 03/22/2021 14:38:33 ?-------------------------------------------------------------------------------- ?Activities of Daily Living Details ?Patient Name: Date of Service: ?[name], [name] 03/22/2021 1:15 PM ?Medical Record Number: [id number] ?Patient Account Number: [id number] ?Date of Birth/Sex: Treating RN: ?02/14/1955 (66 y.o. [name]) Dellie Catholic ?Primary Care Garlan Drewes: [name] Other Clinician: ?Referring Shermar Friedland: ?Treating Darleth Eustache/Extender: Fredirick Maudlin ?Celesta Gentile ?Weeks in Treatment: 0 ?Activities of Daily Living Items ?Answer ?Activities of Daily Living (Please select one for each item) ?[place] ?T Medications ?ake Completely Able ?Use T elephone Completely Able ?Care for Appearance Completely Able ?Use T oilet Completely Able ?Bath / Shower Completely Able ?Dress Self Completely Able ?Feed Self Completely Able ?Walk Completely Able ?Get In / Out Bed Completely Able ?Housework Completely Able ?Prepare Meals Completely Able ?Handle Money Completely Able ?Shop for Self Completely Able ?Electronic Signature(s) ?Signed: 03/22/2021 5:11:36 PM By: Dellie Catholic RN ?Entered By: Dellie Catholic on 03/22/2021 14:39:16 ?-------------------------------------------------------------------------------- ?Education Screening Details ?Patient Name: ?Date of Service: ?[name], [name]. 03/22/2021 1:15 PM ?Medical Record Number: [id number] ?Patient Account Number: [id number] ?Date of Birth/Sex: ?Treating RN: ?02-19-1955 (66 y.o. [name]) Dellie Catholic ?Primary Care Page Lancon: [name] ?Other Clinician: ?Referring Booker Bhatnagar: ?Treating Jourdin Connors/Extender: Fredirick Maudlin ?Celesta Gentile ?Weeks in Treatment: 0 ?Primary Learner Assessed: Patient ?Learning Preferences/Education Level/Primary Language ?Learning Preference: Explanation, Demonstration, Printed Material ?Highest Education Level: College or Above ?Preferred Language: English ?Cognitive Barrier ?Language Barrier: No ?Translator Needed: No ?Memory Deficit: No ?Emotional Barrier: No ?Cultural/Religious Beliefs Affecting Medical Care: No ?Physical Barrier ?Impaired Vision: No ?Impaired Hearing: No ?Decreased Hand dexterity: No ?Knowledge/Comprehension ?Knowledge Level: High ?Comprehension Level: High ?Ability to understand written instructions: High ?Ability to understand verbal instructions: High ?Motivation ?Anxiety Level: Calm ?Cooperation: Cooperative ?Education Importance: Acknowledges Need ?Interest in Health Problems: Asks Questions ?Perception: Coherent ?Willingness to Engage in Self-Management High ?Activities: ?Readiness to Engage in Self-Management High ?Activities: ?Electronic Signature(s) ?Signed: 03/22/2021 5:11:36 PM By: Dellie Catholic RN ?Entered By: Dellie Catholic on 03/22/2021 14:42:23 ?-------------------------------------------------------------------------------- ?Fall Risk Assessment Details ?Patient Name: ?Date of Service: ?[name], [name]. 03/22/2021 1:15 PM ?Medical Record Number: [id number] ?Patient Account Number: [id number] ?Date of Birth/Sex: ?Treating RN: ?09/29/1955 (66 y.o. [name]) Dellie Catholic ?Primary Care Dashon Mcintire: [name] ?Other Clinician: ?Referring Afsheen Antony: ?Treating Kaylen Nghiem/Extender: Fredirick Maudlin ?Celesta Gentile ?Weeks in Treatment: 0 ?Fall Risk Assessment Items ?Have you had 2 or more falls in the last 12 monthso 0 No ?Have you had any fall that resulted in injury in the last 12 monthso 0 No ?FALLS RISK SCREEN ?History of falling - immediate or within 3 months 0 No ?Secondary diagnosis (Do you have 2 or more medical diagnoseso) 0 No ?Ambulatory aid ?None/bed rest/wheelchair/nurse 0 No ?Crutches/cane/walker 15 Yes ?Furniture 0 No ?Intravenous therapy Access/Saline/Heparin Lock 0 No ?Gait/Transferring ?Normal/ bed rest/ wheelchair 0 No ?Weak (short steps with or  without shuffle, stooped but able to lift head while walking, may seek 0 No ?support from furniture) ?Impaired (short steps with shuffle, may have difficulty arising from chair, head down, impaired 0 No ?balance) ?Mental Status ?Oriented to own ability 0 No ?Electronic Signature(s) ?Signed: 03/22/2021 5:11:36 PM By: Dellie Catholic RN ?Entered By: Dellie Catholic on 03/22/2021 14:44:18 ?-------------------------------------------------------------------------------- ?Foot Assessment Details ?Patient Name: ?Date of Service: ?[name], [name]. 03/22/2021 1:15 PM ?Medical Record Number: [id number] ?Patient Account Number: [id number] ?Date of Birth/Sex: ?Treating RN: ?1955-01-29 (66 y.o. [name]) Dellie Catholic ?Primary Care Ellijah Leffel: [name] ?Other Clinician: ?Referring Vincie Linn: ?Treating Birtha Hatler/Extender: Fredirick Maudlin ?Celesta Gentile ?Weeks in Treatment: 0 ?Foot Assessment Items ?Site Locations ?+ = Sensation present, - = Sensation absent, C = Callus, U = Ulcer ?R = Redness, W = Warmth, M = Maceration, PU = Pre-ulcerative lesion ?F = Fissure, S = Swelling, D = Dryness ?Assessment ?Right: Left: ?Other Deformity: No No ?Prior Foot Ulcer: No No ?Prior Amputation: No No ?Charcot Joint: No No ?Ambulatory Status: Ambulatory With Help ?Assistance Device:  Walker ?Gait: Steady ?Electronic Signature(s) ?Signed: 03/22/2021 5:11:36 PM By: Dellie Catholic RN ?Entered By: Dellie Catholic on 03/22/2021 14:47:04 ?-------------------------------------------------------------------------------- ?Nutrition Risk Screening Details ?Patient Name: ?Date of Service: ?Jacinto, [name]. 03/22/2021 1:15 PM ?Medical Record Number: [id number] ?Patient Account Number: [id number] ?Date of Birth/Sex: ?Treating RN: ?[birth year] (66 y.o. [name]) Dellie Catholic ?Primary Care Tavaris Eudy: [name] ?Other Clinician: ?Referring Krishav Mamone: ?Treating Zita Ozimek/Extender: Fredirick Maudlin ?Celesta Gentile ?Weeks in Treatment: 0 ?Height (in): ?Weight (lbs): ?Body Mass Index (BMI): ?Nutrition Risk Screening Items ?Score Screening ?NUTRITION RISK SCREEN: ?I have an illness or condition that made me change the kind and/or amount of food I eat 0 No ?I eat fewer than two meals per day 0 No ?I eat few fruits and vegetables, or milk products 0 No ?I have three or more drinks of beer, liquor or wine almost every day 0 No ?I have tooth or mouth problems that make it hard for me to eat 0 No ?I don't always have enough money to buy the food I need 0 No ?I eat alone most of the time 0 No ?I take three or more different prescribed or over-the-counter drugs a day 0 No ?Without wanting to, I have lost or gained 10 pounds in the last six months 0 No ?I am not always physically able to shop, cook and/or feed myself 0 No ?Nutrition Protocols ?Good Risk Protocol 0 No interventions needed ?Moderate Risk Protocol ?High Risk Proctocol ?Risk Level: Good Risk ?Score: 0 ?Electronic Signature(s) ?Signed: 03/22/2021 5:11:36 PM By: Dellie Catholic RN ?Entered By: Dellie Catholic on 03/22/2021 14:44:59 ?

## 2021-03-22 NOTE — Progress Notes (Signed)
[name] ([id number]) , Visit Report for 03/22/2021 Debridement Details Patient Name: Date of Service: [name], [name] 03/22/2021 1:15 PM Medical Record Number: [id number] Patient Account Number: [id number] Date of Birth/Sex: Treating RN: 03-16-1955 (66 y.o. [name] Primary Care Provider: [name] Other Clinician: Referring Provider: Treating Provider/Extender: Betsy Pries in Treatment: 0 Debridement Performed for Assessment: Wound #6 Right T Third oe Performed By: Physician [name] Maudlin, MD Debridement Type: Debridement Level of Consciousness (Pre-procedure): Awake and Alert Pre-procedure Verification/Time Out Yes - 15:12 Taken: Start Time: 15:12 Pain Control: Lidocaine 4% T opical Solution T Area Debrided (L x W): otal 0.3 (cm) x 0.2 (cm) = 0.06 (cm) Tissue and other material debrided: Non-Viable, Callus, Slough, Subcutaneous, Slough Level: Skin/Subcutaneous Tissue Debridement Description: Excisional Instrument: Curette Bleeding: Minimum Hemostasis Achieved: Pressure End Time: 15:14 Procedural Pain: 0 Post Procedural Pain: 0 Response to Treatment: Procedure was tolerated well Level of Consciousness (Post- Awake and Alert procedure): Post Debridement Measurements of Total Wound Length: (cm) 0.3 Width: (cm) 0.2 Depth: (cm) 0.1 Volume: (cm) 0.005 Character of Wound/Ulcer Post Debridement: Improved Post Procedure Diagnosis Same as Pre-procedure Electronic Signature(s) Signed: 03/22/2021 5:11:36 PM By: Dellie Catholic RN Signed: 03/22/2021 5:17:46 PM By: [name] Maudlin MD FACS Entered By: Dellie Catholic on 03/22/2021 15:15:52 -------------------------------------------------------------------------------- Debridement Details Patient Name: Date of Service: [name], [name] [name] 03/22/2021 1:15 PM Medical Record Number: [id number] Patient Account Number: [id number] Date of Birth/Sex: Treating RN: 19-Dec-1955 (66 y.o. [name] Primary Care Provider: [name] Other Clinician: Referring Provider: Treating Provider/Extender: Betsy Pries in Treatment: 0 Debridement Performed for Assessment: Wound #5 Right T Second oe Performed By: Physician [name] Maudlin, MD Debridement Type: Debridement Level of Consciousness (Pre-procedure): Awake and Alert Pre-procedure Verification/Time Out Yes - 15:12 Taken: Start Time: 15:12 Pain Control: Lidocaine 4% T opical Solution T Area Debrided (L x W): otal 0.3 (cm) x 1.1 (cm) = 0.33 (cm) Tissue and other material debrided: Non-Viable, Callus, Slough, Subcutaneous, Slough Level: Skin/Subcutaneous Tissue Debridement Description: Excisional Instrument: Curette Bleeding: Minimum Hemostasis Achieved: Pressure End Time: 15:14 Procedural Pain: 0 Post Procedural Pain: 0 Response to Treatment: Procedure was tolerated well Level of Consciousness (Post- Awake and Alert procedure): Post Debridement Measurements of Total Wound Length: (cm) 0.3 Width: (cm) 1.1 Depth: (cm) 0.2 Volume: (cm) 0.052 Character of Wound/Ulcer Post Debridement: Improved Post Procedure Diagnosis Same as Pre-procedure Electronic Signature(s) Signed: 03/22/2021 5:11:36 PM By: Dellie Catholic RN Signed: 03/22/2021 5:17:46 PM By: [name] Maudlin MD FACS Entered By: Dellie Catholic on 03/22/2021 15:16:42 -------------------------------------------------------------------------------- Physician Orders Details Patient Name: Date of Service: [name], [name] [name] 03/22/2021 1:15 PM Medical Record Number: [id number] Patient Account Number: [id number] Date of Birth/Sex: Treating RN: 1955/11/20 (66 y.o. [name] Primary Care Provider: [name] Other Clinician: Referring Provider: Treating Provider/Extender: Betsy Pries in Treatment: 0 Verbal / Phone Orders: No Diagnosis Coding Follow-up Appointments ppointment in 1  week. - Dr Celine Ahr Return A Bathing/ Shower/ Hygiene May shower and wash wound with soap and water. Edema Control - Lymphedema / SCD / Other Avoid standing for long periods of time. Moisturize legs daily. Off-Loading Open toe surgical shoe to: - Right foot Wound Treatment Wound #5 - T Second oe Wound Laterality: Right Cleanser: Soap and Water Every Other Day/30 Days Discharge Instructions: May shower and wash wound with dial antibacterial soap and water prior to dressing change. Cleanser: Wound Cleanser Every Other Day/30 Days Discharge Instructions: Cleanse the wound with  wound cleanser prior to applying a clean dressing using gauze sponges, not tissue or cotton balls. Prim Dressing: KerraCel Ag Gelling Fiber Dressing, 2x2 in (silver alginate) Every Other Day/30 Days ary Discharge Instructions: Apply silver alginate to wound bed as instructed Prim Dressing: Optifoam Non-Adhesive Dressing, 4x4 in Every Other Day/30 Days ary Discharge Instructions: Apply to wound bed as instructed Secondary Dressing: Woven Gauze Sponges 2x2 in Every Other Day/30 Days Discharge Instructions: Apply over primary dressing as directed. Secured With: 75M Medipore H Soft Cloth Surgical T ape, 4 x 10 (in/yd) Every Other Day/30 Days Discharge Instructions: Secure with tape as directed. Electronic Signature(s) Signed: 03/22/2021 5:11:36 PM By: Dellie Catholic RN Signed: 03/22/2021 5:17:46 PM By: [name] Maudlin MD FACS Entered By: Dellie Catholic on 03/22/2021 15:20:37 -------------------------------------------------------------------------------- HxROS Details Patient Name: Date of Service: [name], [name] 03/22/2021 1:15 PM Medical Record Number: [id number] Patient Account Number: [id number] Date of Birth/Sex: Treating RN: 1955/04/19 (66 y.o. M) Primary Care Provider: [name] Other Clinician: Referring Provider: Treating Provider/Extender: Fransico Michael in Treatment:  0 Information Obtained From Patient Constitutional Symptoms (General Health) Complaints and Symptoms: Negative for: Fatigue; Fever; Chills; Marked Weight Change Eyes Complaints and Symptoms: Negative for: Dry Eyes; Vision Changes; Glasses / Contacts Ear/Nose/Mouth/Throat Complaints and Symptoms: Negative for: Chronic sinus problems or rhinitis Integumentary (Skin) Complaints and Symptoms: Positive for: Wounds - Right 2nd and 3rd toes Medical History: Past Medical History Notes: Psoriasis Neurologic Complaints and Symptoms: Negative for: Numbness/parasthesias Hematologic/Lymphatic Medical History: Past Medical History Notes: 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 Respiratory Medical History: Positive for: Sleep Apnea - wears CPAP Past Medical History Notes: ARDS, Recurrent pneumonia, hypoxic Cardiovascular Medical History: Positive for: Arrhythmia - Atrial Fibrillation; Hypertension; Hypotension; Peripheral Arterial Disease Past Medical History Notes: Acute/systolic (congestive) heart failure, cardiomyopathy, Permanent Atrial Fibrillation, Type 2 diabetes mellitus with peripheral angiopathy Gastrointestinal Medical History: Past Medical History Notes: Dysphagia Endocrine Medical History: Positive for: Type II Diabetes Genitourinary Medical History: Past Medical History Notes: UTI due to extended-spectrum beta lactamase (ESBL) producing Escherichia coli Musculoskeletal Medical History: Positive for: Osteoarthritis - Hand Oncologic Medical History: Past Medical History Notes: Non Hodgkins Lymphoma Immunizations Pneumococcal Vaccine: Received Pneumococcal Vaccination: Yes Received Pneumococcal Vaccination On or After 60th Birthday: No Implantable Devices No devices added Family and Social History Unknown History: Yes; Former smoker - quit - ended on 03/24/2013; Marital Status - Divorced; Alcohol Use: Rarely; Drug Use: No History; Caffeine  Use: Daily - T Financial Concerns: No; Food, Clothing or Shelter Needs: No; Support System Lacking: No; Transportation Concerns: No ea; Electronic Signature(s) Signed: 03/22/2021 5:11:36 PM By: Dellie Catholic RN Signed: 03/22/2021 6:06:32 PM By: Linton Ham MD Previous Signature: 03/22/2021 12:11:07 PM Version By: Donavan Burnet CHT EMT BS , , Entered By: Dellie Catholic on 03/22/2021 14:38:02

## 2021-03-24 ENCOUNTER — Other Ambulatory Visit: Payer: Self-pay | Admitting: Cardiology

## 2021-03-24 DIAGNOSIS — G4733 Obstructive sleep apnea (adult) (pediatric): Secondary | ICD-10-CM | POA: Diagnosis not present

## 2021-03-25 DIAGNOSIS — S81801A Unspecified open wound, right lower leg, initial encounter: Secondary | ICD-10-CM | POA: Diagnosis not present

## 2021-03-25 DIAGNOSIS — S81802A Unspecified open wound, left lower leg, initial encounter: Secondary | ICD-10-CM | POA: Diagnosis not present

## 2021-03-29 ENCOUNTER — Ambulatory Visit (INDEPENDENT_AMBULATORY_CARE_PROVIDER_SITE_OTHER): Payer: Medicare Other | Admitting: Podiatry

## 2021-03-29 ENCOUNTER — Other Ambulatory Visit: Payer: Self-pay

## 2021-03-29 ENCOUNTER — Encounter (HOSPITAL_BASED_OUTPATIENT_CLINIC_OR_DEPARTMENT_OTHER): Payer: Medicare Other | Admitting: General Surgery

## 2021-03-29 DIAGNOSIS — L97511 Non-pressure chronic ulcer of other part of right foot limited to breakdown of skin: Secondary | ICD-10-CM | POA: Diagnosis not present

## 2021-03-29 DIAGNOSIS — L97522 Non-pressure chronic ulcer of other part of left foot with fat layer exposed: Secondary | ICD-10-CM | POA: Diagnosis not present

## 2021-03-30 ENCOUNTER — Encounter (HOSPITAL_BASED_OUTPATIENT_CLINIC_OR_DEPARTMENT_OTHER): Payer: Medicare Other | Admitting: General Surgery

## 2021-04-05 NOTE — Progress Notes (Signed)
Subjective: ?[name] is a 66 y.o. is seen today in office s/p left transmetatarsal amputation preformed on 08/05/2020 which resulted in dehiscence and for wounds of the right second third toes.  He has follow-up with wound care center and he states they are doing about the same as what he has been doing.  He has not seen increasing swelling or redness or drainage.  He feels that both feet are doing better.  He has no other concerns today.  ? ?Objective: ?General: No acute distress, AAOx3  ?Left foot: On the central aspect is a hyperkeratotic lesion.  After debridement of this area is preulcerative but there is no definitive skin breakdown identified today.  There is no edema, erythema, drainage or pus or any signs of infection today.  ?Right foot: Along the second and third digit superficial granular wound present with fibrotic tissue also noted.   Wounds appear to be smaller today.  There is no surrounding erythema, ascending cellulitis.  No fluctuation or crepitation but there is no malodor.   ?No pain with calf compression, swelling, warmth, erythema.  ? ? ?Assessment and Plan:  ?Status post left foot surgery, healing although slowly; right foot toe ulceration.  ? ?-Treatment options discussed including all alternatives, risks, and complications ?-Sharply debrided the hyperkeratotic tissue on the left foot.  The area is preulcerative he still is to monitor this very closely for any further skin breakdown but appears to be improving. ?-On the right foot there is still granular wound present although they are getting smaller.  No obvious signs of infection.  Continue with Betadine dressing changes for now.  Discussed changing shoes and socks on a regular basis as well as the antiperspirant spray to help with the moisture control. ?-Monitor for any clinical signs or symptoms of infection and directed to call the office immediately should any occur or go to the ER. ? ?Return in about 3 weeks (around 04/19/2021)  for ulcer. ? ?Trula Slade DPM ?

## 2021-04-09 DIAGNOSIS — I2510 Atherosclerotic heart disease of native coronary artery without angina pectoris: Secondary | ICD-10-CM | POA: Diagnosis not present

## 2021-04-09 DIAGNOSIS — I10 Essential (primary) hypertension: Secondary | ICD-10-CM | POA: Diagnosis not present

## 2021-04-09 DIAGNOSIS — I480 Paroxysmal atrial fibrillation: Secondary | ICD-10-CM | POA: Diagnosis not present

## 2021-04-09 DIAGNOSIS — E785 Hyperlipidemia, unspecified: Secondary | ICD-10-CM | POA: Diagnosis not present

## 2021-04-19 ENCOUNTER — Ambulatory Visit (INDEPENDENT_AMBULATORY_CARE_PROVIDER_SITE_OTHER): Payer: Medicare Other | Admitting: Podiatry

## 2021-04-19 ENCOUNTER — Ambulatory Visit (INDEPENDENT_AMBULATORY_CARE_PROVIDER_SITE_OTHER): Payer: Medicare Other

## 2021-04-19 DIAGNOSIS — L97511 Non-pressure chronic ulcer of other part of right foot limited to breakdown of skin: Secondary | ICD-10-CM

## 2021-04-19 DIAGNOSIS — I83013 Varicose veins of right lower extremity with ulcer of ankle: Secondary | ICD-10-CM | POA: Diagnosis not present

## 2021-04-19 DIAGNOSIS — L97522 Non-pressure chronic ulcer of other part of left foot with fat layer exposed: Secondary | ICD-10-CM

## 2021-04-19 DIAGNOSIS — L97311 Non-pressure chronic ulcer of right ankle limited to breakdown of skin: Secondary | ICD-10-CM

## 2021-04-19 DIAGNOSIS — L97921 Non-pressure chronic ulcer of unspecified part of left lower leg limited to breakdown of skin: Secondary | ICD-10-CM

## 2021-04-19 MED ORDER — SULFAMETHOXAZOLE-TRIMETHOPRIM 800-160 MG PO TABS: 1.0000 | ORAL_TABLET | Freq: Two times a day (BID) | ORAL | 0 refills | Status: DC

## 2021-04-19 NOTE — Progress Notes (Signed)
Subjective: ?[name] is a 66 y.o. is seen today in office s/p left transmetatarsal amputation preformed on 08/05/2020 which resulted in dehiscence and for wounds of the right second third toes.  Also has a new wound on the left anterior leg from worsening dropped on the leg.  Also given the wounds on his toes he is not on the compression socks and the venous wound on the right medial ankle still cannot open.  Is not seeing any drainage or pus.  Currently denies fevers or chills.  Has no other concerns today. ? ?Objective: ?General: No acute distress, AAOx3  ?Left foot: On the central aspect is a hyperkeratotic lesion.  There is no underlying ulceration drainage or signs of infection.  There is a superficial wound present in the anterior aspect of the leg measuring about 2 x 1.5 cm.  This is superficial.  No drainage or pus.  Mild amount of erythema.  No fluctuation or crepitation but there is malodor. ?Right foot: Along the second and third digit superficial granular wound present with fibrotic tissue also noted.   When the second toe appears to be almost healed scab is present.  Superficial wound noted on the third toe which is doing better.  Wound has superficial opening present.  There is an male wound present on the medial aspect ankle measuring 0.7 x 0.5 cm. ? ? ?Assessment and Plan:  ?Right toe ulcerations, venous wound right ankle, wound anterior leg left side due to injury ? ? ?-Treatment options discussed including all alternatives, risks, and complications ?-Sharp debrided the wound of the right second toe without any complications with the left third toe with scalpel.  No blood loss.  I cleaned all the wounds today.  Small amount of antibiotic ointment and bandage applied.  Discussed wound to the dressing changes.  Given the new wounds but due to the venous wound on the right side do recommend compression socks again but he is to monitor the toes very closely.  Encouraged elevation.  Given the area of  erythema prescribed Bactrim. ?-Monitor for any clinical signs or symptoms of infection and directed to call the office immediately should any occur or go to the ER. ? ?Return in about 2 weeks (around 05/03/2021). ? ? ?Trula Slade DPM ?

## 2021-04-24 DIAGNOSIS — G4733 Obstructive sleep apnea (adult) (pediatric): Secondary | ICD-10-CM | POA: Diagnosis not present

## 2021-04-30 ENCOUNTER — Other Ambulatory Visit: Payer: Self-pay | Admitting: Cardiology

## 2021-05-03 ENCOUNTER — Other Ambulatory Visit: Payer: Self-pay | Admitting: Cardiology

## 2021-05-03 ENCOUNTER — Ambulatory Visit (INDEPENDENT_AMBULATORY_CARE_PROVIDER_SITE_OTHER): Payer: Medicare Other | Admitting: Podiatry

## 2021-05-03 DIAGNOSIS — L97511 Non-pressure chronic ulcer of other part of right foot limited to breakdown of skin: Secondary | ICD-10-CM | POA: Diagnosis not present

## 2021-05-03 DIAGNOSIS — I83013 Varicose veins of right lower extremity with ulcer of ankle: Secondary | ICD-10-CM | POA: Diagnosis not present

## 2021-05-03 DIAGNOSIS — L97522 Non-pressure chronic ulcer of other part of left foot with fat layer exposed: Secondary | ICD-10-CM

## 2021-05-03 DIAGNOSIS — L97311 Non-pressure chronic ulcer of right ankle limited to breakdown of skin: Secondary | ICD-10-CM | POA: Diagnosis not present

## 2021-05-03 DIAGNOSIS — I4821 Permanent atrial fibrillation: Secondary | ICD-10-CM

## 2021-05-03 MED ORDER — SANTYL 250 UNIT/GM EX OINT
1.0000 | TOPICAL_OINTMENT | Freq: Every day | CUTANEOUS | 0 refills | Status: DC
Start: 2021-05-03 — End: 2021-07-05

## 2021-05-04 DIAGNOSIS — I2510 Atherosclerotic heart disease of native coronary artery without angina pectoris: Secondary | ICD-10-CM | POA: Diagnosis not present

## 2021-05-04 DIAGNOSIS — E1151 Type 2 diabetes mellitus with diabetic peripheral angiopathy without gangrene: Secondary | ICD-10-CM | POA: Diagnosis not present

## 2021-05-04 DIAGNOSIS — I10 Essential (primary) hypertension: Secondary | ICD-10-CM | POA: Diagnosis not present

## 2021-05-04 DIAGNOSIS — I700 Atherosclerosis of aorta: Secondary | ICD-10-CM | POA: Diagnosis not present

## 2021-05-04 NOTE — Progress Notes (Signed)
Subjective: ?[name] is a 66 y.o. is seen today in office s/p left transmetatarsal amputation preformed on 08/05/2020 which resulted in dehiscence and for wounds of the right second third toes.  Also presents for follow-up evaluation of the venous ulceration medial right ankle has been applying Vaseline to this since he is doing better with that.  He keeps a foam dressing the anterior aspect of the left leg wound.  Denies any drainage or pus today.  He has no fevers or chills.  Has no other concerns today.  ? ?Objective: ?General: No acute distress, AAOx3  ?Left foot: On the central aspect is a hyperkeratotic lesion.  There is no underlying ulceration drainage or signs of infection.  The anterior aspect of the leg is a superficial wound measuring still 2 x 1.5 cm there is no drainage or pus.  No fluctuation or crepitation.  No malodor. ?Right foot: Hammertoes are present.  There is hyperkeratotic lesions which are preulcerative in the dorsal aspect of the right second and third toes.  There is no edema, erythema, drainage or pus or signs of infection.  On the medial aspect of the ankle there is a fibrogranular wound measuring 2.5 x 1.5 x 0.3 cm without any probing, and or tunneling.  No surrounding erythema, ascending cellulitis.  No fluctuation or crepitation.  There is no malodor. ?No pain with calf compression, erythema or warmth.  There is chronic venous insufficiency present bilaterally. ? ? ? ? ? ? ? ? ? ? ? ?Assessment and Plan:  ?Right toe ulcerations, venous wound right ankle, wound anterior leg left side due to injury ? ? ?-Treatment options discussed including all alternatives, risks, and complications ?-Sharp debrided the hyperkeratotic lesions on the right second and third toes.  Areas do much better.  Continue Betadine dressing changes daily. ?-For the right ankle venous ulceration prescribed Santyl to apply to the wound daily.  Also recommended compression, elevation help with the venous  insufficiency. ?-Continue daily dressing changes for the anterior ankle wound on the left side.  He can apply small mount of Vaseline to this area at night but keep the foam dressing on during the day. ?-Debrided the hyperkeratotic tissue on the left transmetatarsal potation site without any complications or bleeding.  Wound is healed. ?-Monitor for any clinical signs or symptoms of infection and directed to call the office immediately should any occur or go to the ER. ? ?Return in about 3 weeks (around 05/24/2021) for ulcers. ? ?Trula Slade DPM ?

## 2021-05-04 NOTE — Telephone Encounter (Signed)
Prescription refill request for Eliquis received. ?Indication:Afib  ?Last office visit:07/27/20 ([facility]) ?Scr: 1.04 (02/22/21) ?Age: 66 ?Weight: 153.9kg ? ?Appropriate dose and refill sent to requested pharmacy.  ?

## 2021-05-24 ENCOUNTER — Institutional Professional Consult (permissible substitution): Payer: Medicare Other | Admitting: Internal Medicine

## 2021-05-24 DIAGNOSIS — G4733 Obstructive sleep apnea (adult) (pediatric): Secondary | ICD-10-CM | POA: Diagnosis not present

## 2021-05-25 ENCOUNTER — Encounter: Payer: Medicare Other | Admitting: Podiatry

## 2021-05-31 ENCOUNTER — Ambulatory Visit (INDEPENDENT_AMBULATORY_CARE_PROVIDER_SITE_OTHER): Payer: Medicare Other | Admitting: Podiatry

## 2021-05-31 DIAGNOSIS — L97311 Non-pressure chronic ulcer of right ankle limited to breakdown of skin: Secondary | ICD-10-CM

## 2021-05-31 DIAGNOSIS — I83013 Varicose veins of right lower extremity with ulcer of ankle: Secondary | ICD-10-CM

## 2021-05-31 DIAGNOSIS — I872 Venous insufficiency (chronic) (peripheral): Secondary | ICD-10-CM | POA: Diagnosis not present

## 2021-06-02 NOTE — Progress Notes (Signed)
Subjective: [name] is a 66 y.o. is seen today in office today for follow-up evaluation of wounds of both of his legs.  The amputation site on the left transmetatarsal potation is healed as well as the toes on the right third toe there is a small dried scab present but no open lesions.  Still has the wound on the medial aspect of right ankle as well as the anterior left leg.  Get some clear drainage at times but no pus.  He has been trying to keep compression, elevation.  He has socks on Dr. Sharol Given as well.  He has not been wearing these.  He has had an ongoing history since he was 66 years old and venous stasis ulcerations and he states it takes a long time for him to heal these.  Currently denies any fevers or chills.  Objective: General: No acute distress, AAOx3  Left foot: On the central aspect is a hyperkeratotic lesion. Ulceration.  The anterior aspect of the left leg superficial wound measure 2 x 2 cm and superficial any probing, amount or tunneling.  No surrounding erythema, ascending cellulitis.  There is no fluctuation or crepitation.  There is no malodor. Right foot: Hammertoes are present.  There is hyperkeratotic lesions which are preulcerative in the dorsal aspect of the right second and third toes.  Wounds appear to have healed.  On the medial aspect of the ankle there is to be a wound measuring 2.5 x 1.5 x 0.2 cm with fibrogranular wound base.  No slight erythema.  Macerated tissue in the periwound.  No probing, undermining, tunneling.  No pain with calf compression, erythema or warmth.  There is chronic venous insufficiency present bilaterally.  Assessment and Plan:  Right toe ulcerations, venous wound right ankle, wound anterior leg left side due to injury   -Treatment options discussed including all alternatives, risks, and complications -Debrided the wounds without any complications.  We discussed different types of compression, elevation.  Will order juxta lite compression  stockings that he can wrap is he will likely build up with the swelling better.  Have ordered them through [facility].  -Left transmetatarsal amputation site appears to be healed. -Minimal callus on the right third toe.  Ulcerations appear to be healed. -Monitor for any clinical signs or symptoms of infection and directed to call the office immediately should any occur or go to the ER.  Trula Slade DPM

## 2021-06-04 DIAGNOSIS — I83013 Varicose veins of right lower extremity with ulcer of ankle: Secondary | ICD-10-CM | POA: Diagnosis not present

## 2021-06-14 ENCOUNTER — Encounter: Payer: Medicare Other | Admitting: Podiatry

## 2021-06-22 ENCOUNTER — Ambulatory Visit (INDEPENDENT_AMBULATORY_CARE_PROVIDER_SITE_OTHER): Payer: Medicare Other | Admitting: Podiatry

## 2021-06-22 DIAGNOSIS — L97311 Non-pressure chronic ulcer of right ankle limited to breakdown of skin: Secondary | ICD-10-CM | POA: Diagnosis not present

## 2021-06-22 DIAGNOSIS — I872 Venous insufficiency (chronic) (peripheral): Secondary | ICD-10-CM | POA: Diagnosis not present

## 2021-06-22 DIAGNOSIS — I83013 Varicose veins of right lower extremity with ulcer of ankle: Secondary | ICD-10-CM

## 2021-06-30 NOTE — Progress Notes (Signed)
Subjective: [name] is a 66 y.o. is seen today in office today for follow-up evaluation of wounds of both of his legs.  He been wearing his compression socks.  He feels the wounds are getting better and he has no pain.  He does have some clear drainage but no pus.  No present swelling or redness.  No fevers or chills.  No other concerns.    Amputation site appears to be doing well as well as the wound on the toes on the right side.    He has had an ongoing history since he was 66 years old and venous stasis ulcerations and he states it takes a long time for him to heal these.    Objective: General: No acute distress, AAOx3  Left foot: On the central aspect is a hyperkeratotic lesion. Ulceration.  The anterior aspect of the left leg superficial wound measure 2 x 2 cm and superficial any probing, amount or tunneling.  No surrounding erythema, ascending cellulitis.  There is no fluctuation or crepitation.  There is no malodor.  Peers are about the same Right foot: Hammertoes are present.  There is hyperkeratotic lesions which are preulcerative in the dorsal aspect of the right second and third toes.  Wounds appear to have healed.  On the medial aspect of the ankle there is to be a wound measuring 2.5 x 1.5 x 0.2 cm with fibrogranular wound base.  No slight erythema.  Macerated tissue in the periwound.  Also wound present on the lateral aspect of the ankle granular wound base with any probing, or tunneling with macerated periwound.  No probing, undermining, tunneling.  No pain with calf compression, erythema or warmth.  There is chronic venous insufficiency present bilaterally.  Assessment and Plan:  Right toe ulcerations, venous wound right ankle, wound anterior leg left side due to injury   -Treatment options discussed including all alternatives, risks, and complications -I debrided the limits of any complications or bleeding.  Area is still quite macerated and wet.  We will work on trying to  get clean and dry.  Betadine was applied followed by dressing.  Continue compression, elevation.  I discussed referral to wound care center.  He does not feel he will do anything different.  We will continue to see him but if the wounds are not improving will refer for another opinion. -Monitor for any clinical signs or symptoms of infection and directed to call the office immediately should any occur or go to the ER.  Return in about 3 weeks (around 07/13/2021).  Trula Slade DPM

## 2021-07-05 ENCOUNTER — Other Ambulatory Visit: Payer: Self-pay | Admitting: Podiatry

## 2021-07-09 DIAGNOSIS — M109 Gout, unspecified: Secondary | ICD-10-CM | POA: Diagnosis not present

## 2021-07-09 DIAGNOSIS — E785 Hyperlipidemia, unspecified: Secondary | ICD-10-CM | POA: Diagnosis not present

## 2021-07-09 DIAGNOSIS — I5022 Chronic systolic (congestive) heart failure: Secondary | ICD-10-CM | POA: Diagnosis not present

## 2021-07-14 ENCOUNTER — Telehealth: Payer: Self-pay | Admitting: Podiatry

## 2021-07-14 NOTE — Telephone Encounter (Signed)
"  I know Dr. Jacqualyn Posey is out of town.  I need someone to call in a prescription for an antibiotic.  My foot is inflammed and hurts like crazy.  The last time it did this, it hurt like crazy.  He called in something that took care of it.  Let me know.

## 2021-07-15 ENCOUNTER — Other Ambulatory Visit: Payer: Self-pay | Admitting: Podiatry

## 2021-07-15 MED ORDER — SULFAMETHOXAZOLE-TRIMETHOPRIM 800-160 MG PO TABS: 1.0000 | ORAL_TABLET | Freq: Two times a day (BID) | ORAL | 0 refills | Status: DC

## 2021-07-19 ENCOUNTER — Telehealth: Payer: Self-pay | Admitting: Cardiology

## 2021-07-19 ENCOUNTER — Encounter: Payer: Self-pay | Admitting: Cardiology

## 2021-07-19 NOTE — Telephone Encounter (Signed)
Pt is requesting a provider switch from Dr. Percival Spanish to Dr. Gwenlyn Found.

## 2021-07-19 NOTE — Telephone Encounter (Signed)
error 

## 2021-07-20 ENCOUNTER — Ambulatory Visit (INDEPENDENT_AMBULATORY_CARE_PROVIDER_SITE_OTHER): Payer: Medicare Other | Admitting: Podiatry

## 2021-07-20 DIAGNOSIS — L97311 Non-pressure chronic ulcer of right ankle limited to breakdown of skin: Secondary | ICD-10-CM | POA: Diagnosis not present

## 2021-07-20 DIAGNOSIS — I83013 Varicose veins of right lower extremity with ulcer of ankle: Secondary | ICD-10-CM

## 2021-07-21 NOTE — Progress Notes (Signed)
Subjective: [name] is a 66 y.o. is seen today in office today for follow-up evaluation of wounds of both of his legs.  He been wearing his compression socks.  He is continue with Santyl on the wounds as well as trying to keep compression, elevation.  Denies any fevers or chills.  No new concerns.  Patient called stating that his side on the right has worsened and I did call in Bactrim for which he is still taking.  Amputation site appears to be doing well as well as the wound on the toes on the right side.    He has had an ongoing history since he was 66 years old and venous stasis ulcerations and he states it takes a long time for him to heal these.    Objective: General: No acute distress, AAOx3  Left foot: On the central aspect is a hyperkeratotic lesion. Ulceration.  On the left leg superficial wound present which appear to be scabbing over.  There is no surrounding erythema, ascending cellulitis.  No fluctuance or crepitation but there is no odor.  Right foot: Hammertoes are present.  Area the previous wounds appear to have opened up some of the second and third toes but there is no probing, undermining or tunneling.  No fluctuance or crepitation is noted.  The wound present the medial aspect of the ankle is gotten much larger.  Is about 5 x 3 cm with fibrogranular tissue.  There is no probing to bone, and or tunneling.  There is no fluctuation or crepitation is noted.  Granular wound present the lateral aspect the ankle as well without any probing. No pain with calf compression, erythema or warmth.  There is chronic venous insufficiency present bilaterally.  Assessment and Plan:  Right toe ulcerations, venous wound right ankle, wound anterior leg left side due to injury   -Treatment options discussed including all alternatives, risks, and complications -As the wound is worsened on the right ankle on return to the wound care center.  He is to continue Santyl for now.  Continue  compression, elevation.  He states that he has had venous stasis wounds for a long time and they have been worse than this however I am still very concerned about this. -Monitor for any clinical signs or symptoms of infection and directed to call the office immediately should any occur or go to the ER.  Trula Slade DPM

## 2021-07-26 ENCOUNTER — Other Ambulatory Visit (HOSPITAL_COMMUNITY): Payer: Self-pay | Admitting: Radiology

## 2021-07-26 DIAGNOSIS — J849 Interstitial pulmonary disease, unspecified: Secondary | ICD-10-CM

## 2021-07-27 DIAGNOSIS — R0609 Other forms of dyspnea: Secondary | ICD-10-CM | POA: Diagnosis not present

## 2021-07-27 DIAGNOSIS — E1151 Type 2 diabetes mellitus with diabetic peripheral angiopathy without gangrene: Secondary | ICD-10-CM | POA: Diagnosis not present

## 2021-07-27 DIAGNOSIS — I509 Heart failure, unspecified: Secondary | ICD-10-CM | POA: Diagnosis not present

## 2021-07-27 DIAGNOSIS — I5022 Chronic systolic (congestive) heart failure: Secondary | ICD-10-CM | POA: Diagnosis not present

## 2021-08-01 NOTE — Progress Notes (Signed)
Office Visit    Patient Name: [name] Date of Encounter: 08/02/2021  PCP:  Ginger Organ., MD   [facility]  Cardiologist:  Quay Burow, MD  Advanced Practice Provider:  No care team member to display Electrophysiologist:  None      Chief Complaint    [name] is a 66 y.o. male presents today for dyspnea   Past Medical History    Past Medical History:  Diagnosis Date   Acute systolic HF (heart failure) ([place])    Arthritis    lt foot, hips knees    Atrial fibrillation ([place])    Diabetes mellitus, new onset ([place])    Diverticulosis    Dyslipidemia    Gout attack 01/16/2012   Hypertension    Internal hemorrhoids    Lymphoma ([place])    remission for about 2 years, chemo 3 years prior   Pneumonia due to COVID-19 virus 07/2018   Pulmonary hypertension ([place])    Sepsis ([place]) 02/2017   secondary to influenza; requiring trach   Tubular adenoma of colon    Venous stasis ulcers ([place])    Past Surgical History:  Procedure Laterality Date   ABDOMINAL AORTOGRAM W/LOWER EXTREMITY N/A 08/14/2020   Procedure: ABDOMINAL AORTOGRAM W/LOWER EXTREMITY;  Surgeon: Elam Dutch, MD;  Location: [facility];  Service: Cardiovascular;  Laterality: N/A;   AMPUTATION TOE Left 05/08/2019   Procedure: AMPUTATION LEFT GREAT  TOE;  Surgeon: Trula Slade, DPM;  Location: [facility];  Service: Podiatry;  Laterality: Left;   BRONCHOSCOPY     ESOPHAGOGASTRODUODENOSCOPY ENDOSCOPY     Dr Royden Purl HERNIA REPAIR Left    2001   IR REMOVAL TUN ACCESS W/ PORT W/O FL MOD SED  02/03/2020   PORTA CATH INSERTION  2011   TRACHEOSTOMY  03/2017   with decannulation   TRANSMETATARSAL AMPUTATION Left 08/05/2020   Procedure: TRANSMETATARSAL AMPUTATION;  Surgeon: Trula Slade, DPM;  Location: [facility];  Service: Podiatry;  Laterality: Left;    Allergies  Allergies  Allergen Reactions   Latex Hives and Swelling   Ace Inhibitors     Other reaction(s):  cough   Penicillin G     Other reaction(s): rash    History of Present Illness    [name] is a 66 y.o. male with a hx of chronic HFrEF, permanent rate controlled atrial fibrillation, OSA, obesity, chronic venous stasis, NH lymphoma s/p R-CHOP chemotherapy last seen by the Heart and vascular TOC clinic 07/27/20.  February 2019 admitted for acute respiratory failure after 3 weeks of worsening shortness of breath. Required BiPAP, nebs, solumedrol. New onset atrial fib with RVR. CXR with multifocal PNA treated with abx. Developed ARDS secondary to influenza requiring intubation and ultimately tracheostomy and eventually de-cannulated. Echo with moderate LVH, EF 25-30%, diffuse hypokinesis. Followed with Dr. Percival Spanish post admission. Repeat echo 07/2017 EF 50-55%. Repeat echo 11/2018 LVEF 45-50%.  Readmitted 06/2020 with shortness of breath. CXR with pulmonary edema. Treated with IV abx through H&V TOC clinic. Repeat ED visit a few weeks later with similar symptoms. At last clinic visit with H&V TOC clinic 07/17/20 weight was down to 334 lbs. NYH II symptoms, Coreg reduced due to bradycardia/hypotension 12.'5mg'$  BID, Losartan '50mg'$  continued, Lasix '40mg'$  and '20mg'$  QOD. No SLGTi due to ESBL UTI 2019.   He presents today for follow up with his friend [name]. Week before last was more symptomatic with his atrial fibrillation  describes as "just felt weird". No palpitations, tachycardia. No shortness of breath at rest. Does endorse exertional dyspnea. PCP had him increase to Torsemide '60mg'$  in the AM and '40mg'$  in the afternoon last week after he presented with dyspnea.  Reports no chest pain, pressure, or tightness. No orthopnea, PND. Reports no palpitations.  Reports his LE edema is stable at his baseline.  Eats one meal per day between 5 and 6 pm. Drinking unsweet tea, bottles of water with a total of <2L fluid intake per day.   BP at home 120s/80s. Heart rate in the 70s. 2 weeks ago O2 92-93 back to 99 since  increased diuresis.   He is going to [place] at the end of August and wants to ensure he has a plan in place for volume management before leaving.   EKGs/Labs/Other Studies Reviewed:   The following studies were reviewed today:  Echo 11/2018   1. Technically difficult study. Left ventricular ejection fraction, by  visual estimation, is grossly 45 to 50%. There appears to be mildly  decreased global systolic function. Images are not sufficient to assess  for regional wall motion abnormalities.  There is mildly increased left ventricular hypertrophy.   2. Left ventricular diastolic parameters are indeterminate.   3. Global right ventricle has mildly reduced systolic function.The right  ventricular size is mildly enlarged.   4. Left atrial size was mildly dilated.   5. Right atrial size was normal.   6. The mitral valve is normal in structure. Trace mitral valve  regurgitation.   7. The tricuspid valve is not well visualized. Tricuspid valve  regurgitation is trivial.   8. The aortic valve is tricuspid. Aortic valve regurgitation is not  visualized.   9. The pulmonic valve was not well visualized. Pulmonic valve  regurgitation is not visualized.  10. There is dilatation of the aortic root measuring 40 mm.  11. The inferior vena cava is dilated in size with <50% respiratory  variability, suggesting right atrial pressure of 15 mmHg.  12. The tricuspid regurgitant velocity is 2.79 m/s, and with an assumed  right atrial pressure of 15 mmHg, the estimated right ventricular systolic  pressure is moderately elevated at 46.1 mmHg.   Abs aortogram 08/14/20 Operative management: Patient has inline flow to both feet with no significant flow-limiting lesion that would be amenable to percutaneous or open revascularization.  He does have an element of small vessel disease and has a short segment occlusion of the distal anterior tibial/dorsalis pedis artery on the left foot but does have posterior  tibial artery flow.  Would recommend management of wounds with amputation of toes 2 and 3 on the right foot if the wounds do not heal and deteriorate.  Transmetatarsal amputation on the left side has as good a blood supply as it is going to get and hopefully this will heal in the future.   ABI 01/20/21 Summary:  Right: Resting right ankle-brachial index is within normal range. The  right toe-brachial index is normal.   Left: Resting left ankle-brachial index is within normal range.      EKG:  EKG is ordered today.  The ekg ordered today demonstrates rate controlled atrial fibrillation 73 bpm with RBBB.   Recent Labs: 08/24/2020: Magnesium 1.8 02/22/2021: ALT 18; BUN 22; Creatinine 1.04; Hemoglobin 13.9; Platelet Count 169; Potassium 3.8; Sodium 139  Recent Lipid Panel    Component Value Date/Time   TRIG 122 02/26/2017 1306   Risk Assessment/Calculations:   CHA2DS2-VASc Score =  3   This indicates a 3.2% annual risk of stroke. The patient's score is based upon: CHF History: 1 HTN History: 1 Diabetes History: 0 Stroke History: 0 Vascular Disease History: 0 Age Score: 1 Gender Score: 0     Home Medications   Current Meds  Medication Sig   acetaminophen (TYLENOL) 325 MG tablet Take 650 mg by mouth every 6 (six) hours as needed for moderate pain.    albuterol (VENTOLIN HFA) 108 (90 Base) MCG/ACT inhaler Inhale 2 puffs into the lungs every 6 (six) hours as needed for wheezing or shortness of breath.   ascorbic acid (VITAMIN C) 500 MG tablet Take 1 tablet (500 mg total) by mouth 3 (three) times daily.   atorvastatin (LIPITOR) 20 MG tablet TAKE 1 TABLET BY MOUTH EVERY DAY *PT OVERDUE FOR OFFICE VISIT*   cholecalciferol (VITAMIN D) 25 MCG (1000 UNIT) tablet Take 1 tablet (1,000 Units total) by mouth daily.   colchicine 0.6 MG tablet Take 0.6 mg by mouth 2 (two) times daily as needed.   Continuous Blood Gluc Receiver (FREESTYLE LIBRE 2 READER) DEVI 1 each by Does not apply route  daily.   Continuous Blood Gluc Sensor (FREESTYLE LIBRE 2 SENSOR) MISC 1 each by Does not apply route daily.   ELIQUIS 5 MG TABS tablet TAKE 1 TABLET BY MOUTH TWICE A DAY   metFORMIN (GLUCOPHAGE) 500 MG tablet Take 1 tablet (500 mg total) by mouth daily with breakfast.   nitroGLYCERIN (NITRODUR - DOSED IN MG/24 HR) 0.1 mg/hr patch Place 1 patch (0.1 mg total) onto the skin daily.   Omega 3 340 MG CPDR Take 1 capsule (340 mg total) by mouth 2 (two) times daily.   omeprazole (PRILOSEC) 40 MG capsule Take 1 capsule (40 mg total) by mouth daily.   polycarbophil (FIBERCON) 625 MG tablet Take 1 tablet (625 mg total) by mouth daily.   SANTYL 250 UNIT/GM ointment APPLY 1 APPLICATION. TOPICALLY DAILY.   sertraline (ZOLOFT) 50 MG tablet Take 1 tablet (50 mg total) by mouth daily.   sulfamethoxazole-trimethoprim (BACTRIM DS) 800-160 MG tablet Take 1 tablet by mouth 2 (two) times daily.   vitamin B-12 (CYANOCOBALAMIN) 1000 MCG tablet Take 1 tablet (1,000 mcg total) by mouth daily.   [DISCONTINUED] torsemide (DEMADEX) 20 MG tablet Take 1 tablet (20 mg total) by mouth daily.     Review of Systems      All other systems reviewed and are otherwise negative except as noted above.  Physical Exam    VS:  BP 104/62   Pulse 73   Ht '5\' 11"'$  (1.803 m)   Wt (!) 353 lb (160.1 kg)   BMI 49.23 kg/m  , BMI Body mass index is 49.23 kg/m.  Wt Readings from Last 3 Encounters:  08/02/21 (!) 353 lb (160.1 kg)  02/22/21 (!) 339 lb 4 oz (153.9 kg)  11/09/20 (!) 320 lb (145.2 kg)     GEN: Well nourished, obese, well developed, in no acute distress. HEENT: normal. Neck: Supple, no JVD, carotid bruits, or masses. Cardiac: IRIR, no murmurs, rubs, or gallops. No clubbing, cyanosis, edema.  Radials 2+ and equal bilaterally. PT 1+ and equal bilaterally.  Respiratory:  Respirations regular and unlabored, clear to auscultation bilaterally. GI: Soft, nontender, nondistended. MS: No deformity or atrophy. Skin: Warm and  dry, no rash. Bilateral LE with compression stockings and scattered gauze bandages.  Neuro:  Strength and sensation are intact. Psych: Normal affect.  Assessment & Plan    HFrEF /  Dyspnea - Echo 11/2018 EF 45-50%. Seen by PCP last week with increased dyspnea. Torsemide was increased from '20mg'$  QD to '60mg'$  AM and '40mg'$  PM. Notes dyspnea much improved. Update echo, BMP, BNP, CBC. Continue Torsemide '40mg'$  BID in the interim until labs result. Low sodium diet, fluid restriction <2L, and daily weights encouraged. Educated to contact our office for weight gain of 2 lbs overnight or 5 lbs in one week. Consider transition to [facility] pending lab results.   Permanent atrial fibrillation / Hypercoagulable state - Reate controlled on Carvedilol 6.'25mg'$  BID. Unaware of his atrial fibrillation. Continue Eliquis '5mg'$  BID. Denies bleeding complications. Does not meet dose reduction criteria. CHA2DS2-VASc Score = 3 [CHF History: 1, HTN History: 1, Diabetes History: 0, Stroke History: 0, Vascular Disease History: 0, Age Score: 1, Gender Score: 0].  Therefore, the patient's annual risk of stroke is 3.2 %.    Patient assistance paperwork provided to patient and he will bring to next clinic visit to be faxed.  Venous stasis ulcers - Following with podiatry and wound care center. 08/2020 transmetatarsal amputation. ABI 01/2021 normal ABI bilaterally. No new claudication symptoms.   OSA - CPAP compliance encouraged. Endorses using regularly.   RBBB - Stable finding by EKG. No lightheadedness, dizziness. Monitor with periodic EKG.   Obesity - Weight loss via diet and exercise encouraged. Discussed the impact being overweight would have on cardiovascular risk.   Disposition: Follow up  as scheduled  with Quay Burow, MD   Signed, Loel Dubonnet, NP 08/02/2021, 2:23 PM [place]

## 2021-08-02 ENCOUNTER — Encounter (HOSPITAL_BASED_OUTPATIENT_CLINIC_OR_DEPARTMENT_OTHER): Payer: Self-pay | Admitting: Family

## 2021-08-02 ENCOUNTER — Ambulatory Visit (HOSPITAL_BASED_OUTPATIENT_CLINIC_OR_DEPARTMENT_OTHER): Payer: Medicare Other | Admitting: Family

## 2021-08-02 VITALS — BP 104/62 | HR 73 | Ht 71.0 in | Wt 353.0 lb

## 2021-08-02 DIAGNOSIS — I5020 Unspecified systolic (congestive) heart failure: Secondary | ICD-10-CM

## 2021-08-02 DIAGNOSIS — G4733 Obstructive sleep apnea (adult) (pediatric): Secondary | ICD-10-CM | POA: Diagnosis not present

## 2021-08-02 DIAGNOSIS — D6859 Other primary thrombophilia: Secondary | ICD-10-CM

## 2021-08-02 DIAGNOSIS — I4510 Unspecified right bundle-branch block: Secondary | ICD-10-CM

## 2021-08-02 DIAGNOSIS — Z6841 Body Mass Index (BMI) 40.0 and over, adult: Secondary | ICD-10-CM

## 2021-08-02 DIAGNOSIS — I4821 Permanent atrial fibrillation: Secondary | ICD-10-CM

## 2021-08-02 MED ORDER — TORSEMIDE 20 MG PO TABS: 40.0000 mg | ORAL_TABLET | Freq: Two times a day (BID) | ORAL | 1 refills | Status: DC

## 2021-08-02 NOTE — Patient Instructions (Signed)
Medication Instructions:  Your physician has recommended you make the following change in your medication:   CONTINUE Torsemide 2 tablets twice daily until labs result.   *If you need a refill on your cardiac medications before your next appointment, please call your pharmacy*   Lab Work: Your physician recommends that you return for lab work today: BMP, CBC, BNP  If you have labs (blood work) drawn today and your tests are completely normal, you will receive your results only by: MyChart Message (if you have MyChart) OR A paper copy in the mail If you have any lab test that is abnormal or we need to change your treatment, we will call you to review the results.   Testing/Procedures: Your EKG today showed rate controlled atrial fibrillation.   Follow-Up: At [facility], you and your health needs are our priority.  As part of our continuing mission to provide you with exceptional heart care, we have created designated Provider Care Teams.  These Care Teams include your primary Cardiologist (physician) and Advanced Practice Providers (APPs -  Physician Assistants and Nurse Practitioners) who all work together to provide you with the care you need, when you need it.  We recommend signing up for the patient portal called "MyChart".  Sign up information is provided on this After Visit Summary.  MyChart is used to connect with patients for Virtual Visits (Telemedicine).  Patients are able to view lab/test results, encounter notes, upcoming appointments, etc.  Non-urgent messages can be sent to your provider as well.   To learn more about what you can do with MyChart, go to [email or web address].    Your next appointment:   As scheduled with Dr. Gwenlyn Found   Other Instructions  Recommend weighing daily and keeping a log. Please call our office if you have weight gain of 2 pounds overnight or 5 pounds in 1 week.   Date  Time Weight                                             Heart Healthy Diet Recommendations: A low-salt diet is recommended. Meats should be grilled, baked, or boiled. Avoid fried foods. Focus on lean protein sources like fish or chicken with vegetables and fruits. The American Heart Association is a Microbiologist!  American Heart Association Diet and Lifeystyle Recommendations   Exercise recommendations: The American Heart Association recommends 150 minutes of moderate intensity exercise weekly. Try 30 minutes of moderate intensity exercise 4-5 times per week. This could include walking, jogging, or swimming.   Important Information About Sugar

## 2021-08-03 ENCOUNTER — Telehealth (HOSPITAL_BASED_OUTPATIENT_CLINIC_OR_DEPARTMENT_OTHER): Payer: Self-pay | Admitting: Family

## 2021-08-03 LAB — BRAIN NATRIURETIC PEPTIDE: BNP: 475.7 pg/mL — ABNORMAL HIGH (ref 0.0–100.0)

## 2021-08-03 LAB — CBC
Hematocrit: 39 % (ref 37.5–51.0)
Hemoglobin: 12.6 g/dL — ABNORMAL LOW (ref 13.0–17.7)
MCH: 29.4 pg (ref 26.6–33.0)
MCHC: 32.3 g/dL (ref 31.5–35.7)
MCV: 91 fL (ref 79–97)
Platelets: 170 10*3/uL (ref 150–450)
RBC: 4.28 x10E6/uL (ref 4.14–5.80)
RDW: 15.3 % (ref 11.6–15.4)
WBC: 6.9 10*3/uL (ref 3.4–10.8)

## 2021-08-03 LAB — BASIC METABOLIC PANEL
BUN/Creatinine Ratio: 16 (ref 10–24)
BUN: 14 mg/dL (ref 8–27)
CO2: 27 mmol/L (ref 20–29)
Calcium: 9.1 mg/dL (ref 8.6–10.2)
Chloride: 96 mmol/L (ref 96–106)
Creatinine, Ser: 0.9 mg/dL (ref 0.76–1.27)
Glucose: 97 mg/dL (ref 70–99)
Potassium: 3.7 mmol/L (ref 3.5–5.2)
Sodium: 138 mmol/L (ref 134–144)
eGFR: 95 mL/min/{1.73_m2} (ref >59)

## 2021-08-03 NOTE — Telephone Encounter (Signed)
Left message for patient to call and schedule Echocardiogram ordered by Laurann Montana, NP

## 2021-08-04 ENCOUNTER — Telehealth (HOSPITAL_BASED_OUTPATIENT_CLINIC_OR_DEPARTMENT_OTHER): Payer: Self-pay

## 2021-08-04 ENCOUNTER — Encounter (HOSPITAL_BASED_OUTPATIENT_CLINIC_OR_DEPARTMENT_OTHER): Payer: Medicare Other | Attending: Internal Medicine | Admitting: Internal Medicine

## 2021-08-04 DIAGNOSIS — E1151 Type 2 diabetes mellitus with diabetic peripheral angiopathy without gangrene: Secondary | ICD-10-CM | POA: Diagnosis not present

## 2021-08-04 DIAGNOSIS — L97822 Non-pressure chronic ulcer of other part of left lower leg with fat layer exposed: Secondary | ICD-10-CM | POA: Diagnosis not present

## 2021-08-04 DIAGNOSIS — I5020 Unspecified systolic (congestive) heart failure: Secondary | ICD-10-CM

## 2021-08-04 DIAGNOSIS — I509 Heart failure, unspecified: Secondary | ICD-10-CM | POA: Insufficient documentation

## 2021-08-04 DIAGNOSIS — I872 Venous insufficiency (chronic) (peripheral): Secondary | ICD-10-CM | POA: Diagnosis not present

## 2021-08-04 DIAGNOSIS — I890 Lymphedema, not elsewhere classified: Secondary | ICD-10-CM | POA: Insufficient documentation

## 2021-08-04 DIAGNOSIS — I87333 Chronic venous hypertension (idiopathic) with ulcer and inflammation of bilateral lower extremity: Secondary | ICD-10-CM | POA: Diagnosis not present

## 2021-08-04 DIAGNOSIS — I110 Hypertensive heart disease with heart failure: Secondary | ICD-10-CM | POA: Insufficient documentation

## 2021-08-04 DIAGNOSIS — L97312 Non-pressure chronic ulcer of right ankle with fat layer exposed: Secondary | ICD-10-CM | POA: Diagnosis not present

## 2021-08-04 DIAGNOSIS — L97828 Non-pressure chronic ulcer of other part of left lower leg with other specified severity: Secondary | ICD-10-CM | POA: Diagnosis not present

## 2021-08-04 DIAGNOSIS — I2510 Atherosclerotic heart disease of native coronary artery without angina pectoris: Secondary | ICD-10-CM | POA: Diagnosis not present

## 2021-08-04 DIAGNOSIS — L97818 Non-pressure chronic ulcer of other part of right lower leg with other specified severity: Secondary | ICD-10-CM | POA: Diagnosis not present

## 2021-08-04 DIAGNOSIS — E11621 Type 2 diabetes mellitus with foot ulcer: Secondary | ICD-10-CM | POA: Diagnosis not present

## 2021-08-04 DIAGNOSIS — L97518 Non-pressure chronic ulcer of other part of right foot with other specified severity: Secondary | ICD-10-CM | POA: Diagnosis not present

## 2021-08-04 MED ORDER — ENTRESTO 24-26 MG PO TABS: 1.0000 | ORAL_TABLET | Freq: Two times a day (BID) | ORAL | 6 refills | Status: DC

## 2021-08-04 MED ORDER — KLOR-CON M20 20 MEQ PO TBCR: 20.0000 meq | EXTENDED_RELEASE_TABLET | Freq: Every day | ORAL | 3 refills | Status: DC

## 2021-08-04 NOTE — Telephone Encounter (Addendum)
Called results to patient and left results on VM (ok per DPR), instructions left to call office back if patient has any questions! Labs ordered and mailed, prescriptions updated and sent to pharmacy on file .    Entresto prior auth started; Key: [id number]; AWAITING DETERMINATION     ----- Message from Loel Dubonnet, NP sent at 08/03/2021  4:24 PM EDT ----- Hemoglobin with mild anemia. Increase dietary iron (legumes, green leafy vegetables).   BNP with volume overload. Normal kidneys, electrolytes.   Stop Losartan. Start Entresto 24-'26mg'$  BID (will need free 30-day coupon). Continue torsemide '40mg'$  BID. Start Potassium 31mq daily.   BMP in 1-2 weeks for monitoring.

## 2021-08-04 NOTE — Progress Notes (Signed)
[facility] ([id number]) , Visit Report for 08/04/2021 Abuse Risk Screen Details Patient Name: Date of Service: HUGH, KAMARA 08/04/2021 9:45 A M Medical Record Number: [id number] Patient Account Number: [id number] Date of Birth/Sex: Treating RN: 13-Oct-1955 (66 y.o. [name] Primary Care Kimmie Berggren: [name] Other Clinician: Referring Atina Feeley: Treating Sissy Goetzke/Extender: Fransico Michael in Treatment: 0 Abuse Risk Screen Items Answer ABUSE RISK SCREEN: Has anyone close to you tried to hurt or harm you recentlyo No Do you feel uncomfortable with anyone in your familyo No Has anyone forced you do things that you didnt want to doo No Electronic Signature(s) Signed: 08/04/2021 5:11:09 PM By: Deon Pilling RN, BSN Entered By: Deon Pilling on 08/04/2021 10:01:22 -------------------------------------------------------------------------------- Activities of Daily Living Details Patient Name: Date of Service: DAMEAN, POFFENBERGER D. 08/04/2021 9:45 A M Medical Record Number: [id number] Patient Account Number: [id number] Date of Birth/Sex: Treating RN: [birth year] (66 y.o. [name] Primary Care Malayasia Mirkin: [name] Other Clinician: Referring Tailor Westfall: Treating Jannae Fagerstrom/Extender: Fransico Michael in Treatment: 0 Activities of Daily Living Items Answer Activities of Daily Living (Please select one for each item) Drive Automobile Completely Able T Medications ake Completely Able Use T elephone Completely Able Care for Appearance Completely Able Use T oilet Completely Able Bath / Shower Completely Able Dress Self Completely Able Feed Self Completely Able Walk Completely Able Get In / Out Bed Completely Able Housework Completely Able Prepare Meals Completely [place] Completely Able Shop for Self Completely Able Electronic Signature(s) Signed: 08/04/2021 5:11:09 PM By: Deon Pilling RN, BSN Entered By: Deon Pilling  on 08/04/2021 10:01:39 -------------------------------------------------------------------------------- Education Screening Details Patient Name: Date of Service: Canner, [name] 08/04/2021 9:45 A M Medical Record Number: [id number] Patient Account Number: [id number] Date of Birth/Sex: Treating RN: 09/20/1955 (66 y.o. [name], [name] Primary Care Dariush Mcnellis: [name] Other Clinician: Referring Brandolyn Shortridge: Treating Shakoya Gilmore/Extender: Fransico Michael in Treatment: 0 Primary Learner Assessed: Patient Learning Preferences/Education Level/Primary Language Learning Preference: Explanation, Demonstration, Printed Material Highest Education Level: College or Above Preferred Language: English Cognitive Barrier Language Barrier: No Translator Needed: No Memory Deficit: No Emotional Barrier: No Cultural/Religious Beliefs Affecting Medical Care: No Physical Barrier Impaired Vision: Yes Contacts Impaired Hearing: No Decreased Hand dexterity: No Knowledge/Comprehension Knowledge Level: High Comprehension Level: High Ability to understand written instructions: High Ability to understand verbal instructions: High Motivation Anxiety Level: Calm Cooperation: Cooperative Education Importance: Acknowledges Need Interest in Health Problems: Asks Questions Perception: Coherent Willingness to Engage in Self-Management High Activities: Readiness to Engage in Self-Management High Activities: Electronic Signature(s) Signed: 08/04/2021 5:11:09 PM By: Deon Pilling RN, BSN Entered By: Deon Pilling on 08/04/2021 10:02:06 -------------------------------------------------------------------------------- Fall Risk Assessment Details Patient Name: Date of Service: Raudenbush, [name] 08/04/2021 9:45 A M Medical Record Number: [id number] Patient Account Number: [id number] Date of Birth/Sex: Treating RN: 06/04/1955 (66 y.o. [name], [name] Primary Care Jonessa Triplett: [name] Other Clinician: Referring Harjit Douds: Treating Laveda Demedeiros/Extender: Fransico Michael in Treatment: 0 Fall Risk Assessment Items Have you had 2 or more falls in the last 12 monthso 0 Yes Have you had any fall that resulted in injury in the last 12 monthso 0 No FALLS RISK SCREEN History of falling - immediate or within 3 months 0 No Secondary diagnosis (Do you have 2 or more medical diagnoseso) 0 No Ambulatory aid None/bed rest/wheelchair/nurse 0 No Crutches/cane/walker 15 Yes Furniture 0 No Intravenous therapy Access/Saline/Heparin Lock 0 No Gait/Transferring Normal/ bed rest/  wheelchair 0 Yes Weak (short steps with or without shuffle, stooped but able to lift head while walking, may seek 0 No support from furniture) Impaired (short steps with shuffle, may have difficulty arising from chair, head down, impaired 0 No balance) Mental Status Oriented to own ability 0 Yes Electronic Signature(s) Signed: 08/04/2021 5:11:09 PM By: Deon Pilling RN, BSN Entered By: Deon Pilling on 08/04/2021 10:02:29 -------------------------------------------------------------------------------- Foot Assessment Details Patient Name: Date of Service: Cashen, [name] 08/04/2021 9:45 A M Medical Record Number: [id number] Patient Account Number: [id number] Date of Birth/Sex: Treating RN: Mar 17, 1955 (66 y.o. [name] Primary Care Faline Langer: [name] Other Clinician: Referring Detroit Frieden: Treating Rayaan Garguilo/Extender: Fransico Michael in Treatment: 0 Foot Assessment Items Site Locations + = Sensation present, - = Sensation absent, C = Callus, U = Ulcer R = Redness, W = Warmth, M = Maceration, PU = Pre-ulcerative lesion F = Fissure, S = Swelling, D = Dryness Assessment Right: Left: Other Deformity: No No Prior Foot Ulcer: No No Prior Amputation: No Yes Charcot Joint: No No Ambulatory Status: Ambulatory With Help Assistance Device:  Walker GaitEnergy manager) Signed: 08/04/2021 5:11:09 PM By: Deon Pilling RN, BSN Entered By: Deon Pilling on 08/04/2021 10:31:03 -------------------------------------------------------------------------------- Nutrition Risk Screening Details Patient Name: Date of Service: Kempa, [name] 08/04/2021 9:45 A M Medical Record Number: [id number] Patient Account Number: [id number] Date of Birth/Sex: Treating RN: Feb 09, 1955 (66 y.o. [name] Primary Care Zylpha Poynor: [name] Other Clinician: Referring Lennis Rader: Treating Rigdon Macomber/Extender: Fransico Michael in Treatment: 0 Height (in): Weight (lbs): Body Mass Index (BMI): Nutrition Risk Screening Items Score Screening NUTRITION RISK SCREEN: I have an illness or condition that made me change the kind and/or amount of food I eat 2 Yes I eat fewer than two meals per day 0 No I eat few fruits and vegetables, or milk products 0 No I have three or more drinks of beer, liquor or wine almost every day 0 No I have tooth or mouth problems that make it hard for me to eat 0 No I don't always have enough money to buy the food I need 0 No I eat alone most of the time 0 No I take three or more different prescribed or over-the-counter drugs a day 1 Yes Without wanting to, I have lost or gained 10 pounds in the last six months 0 No I am not always physically able to shop, cook and/or feed myself 0 No Nutrition Protocols Good Risk Protocol Moderate Risk Protocol 0 Provide education on nutrition High Risk Proctocol Risk Level: Moderate Risk Score: 3 Electronic Signature(s) Signed: 08/04/2021 5:11:09 PM By: Deon Pilling RN, BSN Entered By: Deon Pilling on 08/04/2021 10:02:36

## 2021-08-04 NOTE — Telephone Encounter (Signed)
Per [facility] prior auth not required but patient is in the doughnut hole, RN to mail pt. Patient assistance for Entresto and Eliquis. Patient updated via mychart message

## 2021-08-04 NOTE — Progress Notes (Signed)
[name] ([id number]) , Visit Report for 08/04/2021 Chief Complaint Document Details Patient Name: Date of Service: [name] 08/04/2021 9:45 A M Medical Record Number: [id number] Patient Account Number: [id number] Date of Birth/Sex: Treating RN: May 19, 1955 (66 y.o. [name] Primary Care Provider: [name] Other Clinician: Referring Provider: Treating Provider/Extender: Fransico Mitsuru Dault in Treatment: 0 Information Obtained from: Patient Chief Complaint Patients presents for treatment of an open diabetic ulcer (x2) 08/04/2021; patient returns to clinic with bilateral leg wounds as well as areas on the right foot Electronic Signature(s) Signed: 08/04/2021 4:38:07 PM By: Linton Ham MD Entered By: Linton Ham on 08/04/2021 12:44:58 -------------------------------------------------------------------------------- Debridement Details Patient Name: Date of Service: [name] Gloss D. 08/04/2021 9:45 A M Medical Record Number: [id number] Patient Account Number: [id number] Date of Birth/Sex: Treating RN: [birth year] (66 y.o. [name] Primary Care Provider: [name] Other Clinician: Referring Provider: Treating Provider/Extender: Fransico Ishanvi Mcquitty in Treatment: 0 Debridement Performed for Assessment: Wound #10 Right T Third oe Performed By: Clinician Deon Pilling, RN Debridement Type: Chemical/Enzymatic/Mechanical Agent Used: gauze and wound cleanser Severity of Tissue Pre Debridement: Fat layer exposed Level of Consciousness (Pre-procedure): Awake and Alert Pre-procedure Verification/Time Out No Taken: Bleeding: None Response to Treatment: Procedure was tolerated well Level of Consciousness (Post- Awake and Alert procedure): Post Debridement Measurements of Total Wound Length: (cm) 0.7 Width: (cm) 0.5 Depth: (cm) 0.1 Volume: (cm) 0.027 Character of Wound/Ulcer Post Debridement: Stable Severity of  Tissue Post Debridement: Fat layer exposed Post Procedure Diagnosis Same as Pre-procedure Electronic Signature(s) Signed: 08/04/2021 4:38:07 PM By: Linton Ham MD Signed: 08/04/2021 5:11:09 PM By: Deon Pilling RN, BSN Signed: 08/04/2021 5:11:09 PM By: Deon Pilling RN, BSN Entered By: Deon Pilling on 08/04/2021 10:52:24 -------------------------------------------------------------------------------- Debridement Details Patient Name: Date of Service: [name] Gloss D. 08/04/2021 9:45 A M Medical Record Number: [id number] Patient Account Number: [id number] Date of Birth/Sex: Treating RN: 03/22/1955 (66 y.o. [name] Primary Care Provider: [name] Other Clinician: Referring Provider: Treating Provider/Extender: Fransico Lamyiah Crawshaw in Treatment: 0 Debridement Performed for Assessment: Wound #9 Right T Second oe Performed By: Clinician Deon Pilling, RN Debridement Type: Chemical/Enzymatic/Mechanical Agent Used: gauze and wound cleanser Severity of Tissue Pre Debridement: Fat layer exposed Level of Consciousness (Pre-procedure): Awake and Alert Pre-procedure Verification/Time Out No Taken: Bleeding: None Response to Treatment: Procedure was tolerated well Level of Consciousness (Post- Awake and Alert procedure): Post Debridement Measurements of Total Wound Length: (cm) 0.7 Width: (cm) 1.2 Depth: (cm) 0.1 Volume: (cm) 0.066 Character of Wound/Ulcer Post Debridement: Stable Severity of Tissue Post Debridement: Fat layer exposed Post Procedure Diagnosis Same as Pre-procedure Electronic Signature(s) Signed: 08/04/2021 4:38:07 PM By: Linton Ham MD Signed: 08/04/2021 5:11:09 PM By: Deon Pilling RN, BSN Entered By: Deon Pilling on 08/04/2021 10:52:46 -------------------------------------------------------------------------------- Debridement Details Patient Name: Date of Service: [name], [name] 08/04/2021 9:45 A M Medical Record Number:  [id number] Patient Account Number: [id number] Date of Birth/Sex: Treating RN: [birth year] (66 y.o. [name] Primary Care Provider: [name] Other Clinician: Referring Provider: Treating Provider/Extender: Fransico Samuell Knoble in Treatment: 0 Debridement Performed for Assessment: Wound #11 Left,Medial Lower Leg Performed By: Physician [name] Dillon., MD Debridement Type: Debridement Severity of Tissue Pre Debridement: Fat layer exposed Level of Consciousness (Pre-procedure): Awake and Alert Pre-procedure Verification/Time Out Yes - 10:40 Taken: Start Time: 10:41 Pain Control: Lidocaine 5% topical ointment T Area Debrided (L x W): otal 2.8 (cm) x 2.5 (  cm) = 7 (cm) Tissue and other material debrided: Viable, Non-Viable, Slough, Subcutaneous, Skin: Dermis , Skin: Epidermis, Slough Level: Skin/Subcutaneous Tissue Debridement Description: Excisional Instrument: Curette Bleeding: Minimum Hemostasis Achieved: Pressure End Time: 10:46 Procedural Pain: 0 Post Procedural Pain: 3 Response to Treatment: Procedure was tolerated well Level of Consciousness (Post- Awake and Alert procedure): Post Debridement Measurements of Total Wound Length: (cm) 2.8 Width: (cm) 2.5 Depth: (cm) 0.1 Volume: (cm) 0.55 Character of Wound/Ulcer Post Debridement: Requires Further Debridement Severity of Tissue Post Debridement: Fat layer exposed Post Procedure Diagnosis Same as Pre-procedure Electronic Signature(s) Signed: 08/04/2021 4:38:07 PM By: Linton Ham MD Signed: 08/04/2021 5:11:09 PM By: Deon Pilling RN, BSN Entered By: Linton Ham on 08/04/2021 12:43:33 -------------------------------------------------------------------------------- Debridement Details Patient Name: Date of Service: [name] Gloss D. 08/04/2021 9:45 A M Medical Record Number: [id number] Patient Account Number: [id number] Date of Birth/Sex: Treating RN: 09/26/1955 (66 y.o. [name],  [name] Primary Care Provider: [name] Other Clinician: Referring Provider: Treating Provider/Extender: Fransico [name] in Treatment: 0 Debridement Performed for Assessment: Wound #12 Left,Anterior Lower Leg Performed By: Physician [name] Dillon., MD Debridement Type: Debridement Severity of Tissue Pre Debridement: Fat layer exposed Level of Consciousness (Pre-procedure): Awake and Alert Pre-procedure Verification/Time Out Yes - 10:40 Taken: Start Time: 10:41 Pain Control: Lidocaine 5% topical ointment T Area Debrided (L x W): otal 3 (cm) x 5.8 (cm) = 17.4 (cm) Tissue and other material debrided: Viable, Non-Viable, Slough, Subcutaneous, Skin: Dermis , Skin: Epidermis, Slough Level: Skin/Subcutaneous Tissue Debridement Description: Excisional Instrument: Curette Bleeding: Minimum Hemostasis Achieved: Pressure End Time: 10:46 Procedural Pain: 0 Post Procedural Pain: 3 Response to Treatment: Procedure was tolerated well Level of Consciousness (Post- Awake and Alert procedure): Post Debridement Measurements of Total Wound Length: (cm) 3 Width: (cm) 5.8 Depth: (cm) 0.1 Volume: (cm) 1.367 Character of Wound/Ulcer Post Debridement: Requires Further Debridement Severity of Tissue Post Debridement: Fat layer exposed Post Procedure Diagnosis Same as Pre-procedure Electronic Signature(s) Signed: 08/04/2021 4:38:07 PM By: Linton Ham MD Signed: 08/04/2021 5:11:09 PM By: Deon Pilling RN, BSN Entered By: Linton Ham on 08/04/2021 12:43:44 -------------------------------------------------------------------------------- Debridement Details Patient Name: Date of Service: [name] Gloss D. 08/04/2021 9:45 A M Medical Record Number: [id number] Patient Account Number: [id number] Date of Birth/Sex: Treating RN: [birth year] (66 y.o. [name], [name] Primary Care Provider: [name] Other Clinician: Referring Provider: Treating  Provider/Extender: Fransico Usiel Astarita in Treatment: 0 Debridement Performed for Assessment: Wound #7 Right,Medial Malleolus Performed By: Physician [name] Dillon., MD Debridement Type: Debridement Severity of Tissue Pre Debridement: Fat layer exposed Level of Consciousness (Pre-procedure): Awake and Alert Pre-procedure Verification/Time Out Yes - 10:40 Taken: Start Time: 10:41 Pain Control: Lidocaine 5% topical ointment T Area Debrided (L x W): otal 6.5 (cm) x 7.5 (cm) = 48.75 (cm) Tissue and other material debrided: Viable, Non-Viable, Slough, Subcutaneous, Skin: Dermis , Skin: Epidermis, Slough Level: Skin/Subcutaneous Tissue Debridement Description: Excisional Instrument: Curette Bleeding: Minimum Hemostasis Achieved: Pressure End Time: 10:46 Procedural Pain: 0 Post Procedural Pain: 3 Response to Treatment: Procedure was tolerated well Level of Consciousness (Post- Awake and Alert procedure): Post Debridement Measurements of Total Wound Length: (cm) 6.5 Width: (cm) 7.5 Depth: (cm) 0.7 Volume: (cm) 26.802 Character of Wound/Ulcer Post Debridement: Requires Further Debridement Severity of Tissue Post Debridement: Fat layer exposed Post Procedure Diagnosis Same as Pre-procedure Electronic Signature(s) Signed: 08/04/2021 4:38:07 PM By: Linton Ham MD Signed: 08/04/2021 5:11:09 PM By: Deon Pilling RN, BSN Entered By: Linton Ham on 08/04/2021 12:44:01 --------------------------------------------------------------------------------  Debridement Details Patient Name: Date of Service: TORIBIO, SEIBER 08/04/2021 9:45 A M Medical Record Number: [id number] Patient Account Number: [id number] Date of Birth/Sex: Treating RN: 09-12-1955 (66 y.o. [name], [name] Primary Care Provider: Other Clinician: [name] Referring Provider: Treating Provider/Extender: Fransico Malaia Buchta in Treatment: 0 Debridement Performed for  Assessment: Wound #8 Right,Lateral Malleolus Performed By: Physician [name] Dillon., MD Debridement Type: Debridement Severity of Tissue Pre Debridement: Fat layer exposed Level of Consciousness (Pre-procedure): Awake and Alert Pre-procedure Verification/Time Out Yes - 10:40 Taken: Start Time: 10:41 Pain Control: Lidocaine 5% topical ointment T Area Debrided (L x W): otal 9.8 (cm) x 3 (cm) = 29.4 (cm) Tissue and other material debrided: Viable, Non-Viable, Slough, Subcutaneous, Skin: Dermis , Skin: Epidermis, Slough Level: Skin/Subcutaneous Tissue Debridement Description: Excisional Instrument: Curette Bleeding: Minimum Hemostasis Achieved: Pressure End Time: 10:46 Procedural Pain: 0 Post Procedural Pain: 3 Response to Treatment: Procedure was tolerated well Level of Consciousness (Post- Awake and Alert procedure): Post Debridement Measurements of Total Wound Length: (cm) 9.8 Width: (cm) 3 Depth: (cm) 0.2 Volume: (cm) 4.618 Character of Wound/Ulcer Post Debridement: Requires Further Debridement Severity of Tissue Post Debridement: Fat layer exposed Post Procedure Diagnosis Same as Pre-procedure Electronic Signature(s) Signed: 08/04/2021 4:38:07 PM By: Linton Ham MD Signed: 08/04/2021 5:11:09 PM By: Deon Pilling RN, BSN Entered By: Linton Ham on 08/04/2021 12:44:15 -------------------------------------------------------------------------------- HPI Details Patient Name: Date of Service: [place], [name] 08/04/2021 9:45 A M Medical Record Number: [id number] Patient Account Number: [id number] Date of Birth/Sex: Treating RN: 1956/01/02 (66 y.o. [name] Primary Care Provider: [name] Other Clinician: Referring Provider: Treating Provider/Extender: Fransico Anadia Helmes in Treatment: 0 History of Present Illness HPI Description: ADMISSION 03/22/2021 This is a 66 year old man with a past medical history significant for diabetes  type 2, congestive heart failure, peripheral arterial disease, morbid obesity, venous insufficiency, and coronary artery disease. He has been followed by [name] in podiatry, who performed a transmetatarsal amputation on the left foot in August 2022. He had issues healing that wound, but based upon Dr. [name] notes, ultimately the TMA wound healed. During his recovery from that surgery, however, ulcers opened up over the DIP joint of the right second and third toe. These have apparently closed and reopened multiple times. It sounds like one of the issues has been moisture accumulation and maceration of the tissues causing them to reopen. At his last visit with [name], on March 01, 2021, there continues to be problems with moisture and he was referred to wound care for further evaluation and management. He had a formal aortogram with runoff performed prior to his TMA. The findings are copied here: Patient has inline flow to both feet with no significant flow-limiting lesion that would be amenable to percutaneous or open revascularization. He does have an element of small vessel disease and has a short segment occlusion of the distal anterior tibial/dorsalis pedis artery on the left foot but does have posterior tibial artery flow. Would recommend management of wounds with amputation of toes 2 and 3 on the right foot if the wounds do not heal and deteriorate. Transmetatarsal amputation on the left side has as good a blood supply as it is going to get and hopefully this will heal in the future. Formal ABIs were done in January 2023. They are normal bilaterally. ABI Findings: +---------+------------------+-----+----------+--------+ Right Rt Pressure (mmHg)IndexWaveform Comment  +---------+------------------+-----+----------+--------+ Brachial 160     +---------+------------------+-----+----------+--------+ PTA 192 1.06 monophasic   +---------+------------------+-----+----------+--------+  DP 159 0.88 monophasic  +---------+------------------+-----+----------+--------+ Great T oe145 0.80    +---------+------------------+-----+----------+--------+ +--------+------------------+-----+---------+-------+ Left Lt Pressure (mmHg)IndexWaveform Comment +--------+------------------+-----+---------+-------+ [id number]     +--------+------------------+-----+---------+-------+ PTA 204 1.13 triphasic  +--------+------------------+-----+---------+-------+ DP 194 1.07 biphasic   +--------+------------------+-----+---------+-------+ +-------+-----------+-----------+------------+------------+ ABI/TBIT oday's ABIT oday's TBIPrevious ABIPrevious TBI +-------+-----------+-----------+------------+------------+ Right 1.06 0.8 1.26 0.65  +-------+-----------+-----------+------------+------------+ Left 1.13 amputation 1.15 amputation  +-------+-----------+-----------+------------+------------+ Previous ABI on 08/06/20 at [facility] Pedal pressures falsely elevated due to medial calcification. Summary: Right: Resting right ankle-brachial index is within normal range. The right toe-brachial index is normal. Left: Resting left ankle-brachial index is within normal range. READMISSION 08/04/2021 Mr. Grimley is now a 66 year old man who I remember from this clinic many years ago I think he had a right lower extremity predominantly venous wound at the time. He was here for 1 visit in March of this year had wounds on his right second and third toes we apparently dressed them many and they healed so he did not come back. He is listed in [place] is being a diabetic although the patient denies this says he is verified it with his primary doctor. In any case over the last several weeks or so according the patient although these wounds look somewhat more chronic than that he has developed  predominantly large wounds on the right medial and right lateral ankle smaller areas on the left leg and areas on the dorsal aspect of his right second and third toes. Its not clear how he has been dressing these. 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 The patient has known PAD. He had an angiogram in August 2022 at which time he had nonhealing wounds in both feet. On the left his major vessels in the thigh were all patent. He had three-vessel patent to the level of the ankle. He had a very short occlusion in the left anterior tibial. On the right lower extremity the major vessels in his thigh were all patent. He had three-vessel runoff to the foot sluggish filling of the anterior tibial artery. He was felt to have some component of small vessel disease but nothing that was amenable or needed revascularization. It was recommended that he have amputation of the second and third toes on the right foot if they did not heal He has been following with [name] of podiatry. [name] got him juxta lite stockings although I do not think he had them on properly he has uncontrolled edema in both legs Past medical history includes type 2 diabetes [although the patient really denies this], left TMA in 2022,lower extremity wounds in fact attendance at this clinic in 2009-2010, A-fib on Eliquis, chronic venous insufficiency with secondary lymphedema history of non-Hodgkin's lymphoma. Electronic Signature(s) Signed: 08/04/2021 4:38:07 PM By: Linton Ham MD Entered By: Linton Ham on 08/04/2021 12:51:02 -------------------------------------------------------------------------------- Physical Exam Details Patient Name: Date of Service: Kahan, [name] 08/04/2021 9:45 A M Medical Record Number: [id number] Patient Account Number: [id number] Date of Birth/Sex: Treating RN: Aug 31, 1955 (66 y.o. [name] Primary Care  Provider: [name] Other Clinician: Referring Provider: Treating Provider/Extender: Fransico Megan Presti in Treatment: 0 Constitutional Sitting or standing Blood Pressure is within target range for patient.. Pulse regular and within target range for patient.[name] Kitchen Respirations regular, non-labored and within target range.. Temperature is normal and within the target range for the patient.[name] Kitchen Appears in no distress. Cardiovascular Pedal pulses are palpable in his feet.[name] Kitchen He  has uncontrolled swelling in both lower extremities in fact the swelling extends up into his thighs posteriorly.. Notes Wound exam; he has small open areas on the left anterior and left medial both of these are nonviable surface. The major wound is on the right medial ankle and a large wound on the right lateral ankle. He has severe chronic venous insufficiency with skin changes associated with this in both legs. I note the left TMA -He has 2 wounds on the dorsal aspect of his right second and third toe. I did not debride these mechanically and truthfully I did not think these look too bad. Certainly not at imminent risk of amputation Electronic Signature(s) Signed: 08/04/2021 4:38:07 PM By: Linton Ham MD Entered By: Linton Ham on 08/04/2021 12:56:55 -------------------------------------------------------------------------------- Physician Orders Details Patient Name: Date of Service: [place]au, [name] 08/04/2021 9:45 A M Medical Record Number: [id number] Patient Account Number: [id number] Date of Birth/Sex: Treating RN: 1955-03-07 (66 y.o. [name] Primary Care Provider: [name] Other Clinician: Referring Provider: Treating Provider/Extender: Fransico Osa Campoli in Treatment: 0 Verbal / Phone Orders: No Diagnosis Coding ICD-10 Coding Code Description I89.0 Lymphedema, not elsewhere classified Follow-up Appointments ppointment in 1 week. - Wednesday Abigail Butts, Overflow room 6 230pm 8/2/223 Return A Other: - [employer] wound care supplies for your toes. Bathing/ Shower/ Hygiene May shower with protection but do not get wound dressing(s) wet. Edema Control - Lymphedema / SCD / Other Elevate legs to the level of the heart or above for 30 minutes daily and/or when sitting, a frequency of: - 3-4 times a day throughout the day. Avoid standing for long periods of time. Exercise regularly Wound Treatment Wound #10 - T Third oe Wound Laterality: Right Cleanser: Wound Cleanser Every Other Day/30 Days Discharge Instructions: Cleanse the wound with wound cleanser prior to applying a clean dressing using gauze sponges, not tissue or cotton balls. Prim Dressing: KerraCel Ag Gelling Fiber Dressing, 2x2 in (silver alginate) Every Other Day/30 Days ary Discharge Instructions: Apply silver alginate to wound bed as instructed Secondary Dressing: Woven Gauze Sponges 2x2 in Every Other Day/30 Days Discharge Instructions: Apply over primary dressing as directed. Secured With: 33M Medipore Public affairs consultant Surgical T 2x10 (in/yd) Every Other Day/30 Days ape Discharge Instructions: Secure with tape as directed. Wound #11 - Lower Leg Wound Laterality: Left, Medial Peri-Wound Care: Zinc Oxide Ointment 30g tube 1 x Per Week/30 Days Discharge Instructions: Apply Zinc Oxide to periwound with each dressing change Peri-Wound Care: Sween Lotion (Moisturizing lotion) 1 x Per Week/30 Days Discharge Instructions: Apply moisturizing lotion as directed Prim Dressing: IODOFLEX 0.9% Cadexomer Iodine Pad 4x6 cm 1 x Per Week/30 Days ary Discharge Instructions: Apply to wound bed as instructed Secondary Dressing: ABD Pad, 8x10 1 x Per Week/30 Days Discharge Instructions: Apply over primary dressing as directed. Secondary Dressing: CarboFLEX Odor Control Dressing, 4x4 in 1 x Per Week/30 Days Discharge Instructions: Apply over primary dressing as directed. Secondary Dressing: Zetuvit  Plus 4x8 in 1 x Per Week/30 Days Discharge Instructions: Apply over primary dressing as directed. Compression Wrap: ThreePress (3 layer compression wrap) 1 x Per Week/30 Days Discharge Instructions: Apply three layer compression as directed. Wound #12 - Lower Leg Wound Laterality: Left, Anterior Peri-Wound Care: Zinc Oxide Ointment 30g tube 1 x Per Week/30 Days Discharge Instructions: Apply Zinc Oxide to periwound with each dressing change Peri-Wound Care: Sween Lotion (Moisturizing lotion) 1 x Per Week/30 Days Discharge Instructions: Apply moisturizing lotion as directed Prim Dressing:  IODOFLEX 0.9% Cadexomer Iodine Pad 4x6 cm 1 x Per Week/30 Days ary Discharge Instructions: Apply to wound bed as instructed Secondary Dressing: ABD Pad, 8x10 1 x Per Week/30 Days Discharge Instructions: Apply over primary dressing as directed. Secondary Dressing: CarboFLEX Odor Control Dressing, 4x4 in 1 x Per Week/30 Days Discharge Instructions: Apply over primary dressing as directed. Secondary Dressing: Zetuvit Plus 4x8 in 1 x Per Week/30 Days Discharge Instructions: Apply over primary dressing as directed. Compression Wrap: ThreePress (3 layer compression wrap) 1 x Per Week/30 Days Discharge Instructions: Apply three layer compression as directed. Wound #7 - Malleolus Wound Laterality: Right, Medial Peri-Wound Care: Zinc Oxide Ointment 30g tube 1 x Per Week/30 Days Discharge Instructions: Apply Zinc Oxide to periwound with each dressing change Peri-Wound Care: Sween Lotion (Moisturizing lotion) 1 x Per Week/30 Days Discharge Instructions: Apply moisturizing lotion as directed Prim Dressing: IODOFLEX 0.9% Cadexomer Iodine Pad 4x6 cm 1 x Per Week/30 Days ary Discharge Instructions: Apply to wound bed as instructed Secondary Dressing: ABD Pad, 8x10 1 x Per Week/30 Days Discharge Instructions: Apply over primary dressing as directed. Secondary Dressing: CarboFLEX Odor Control Dressing, 4x4 in 1 x Per  Week/30 Days Discharge Instructions: Apply over primary dressing as directed. Secondary Dressing: Zetuvit Plus 4x8 in 1 x Per Week/30 Days Discharge Instructions: Apply over primary dressing as directed. Compression Wrap: ThreePress (3 layer compression wrap) 1 x Per Week/30 Days Discharge Instructions: Apply three layer compression as directed. Wound #8 - Malleolus Wound Laterality: Right, Lateral Peri-Wound Care: Zinc Oxide Ointment 30g tube 1 x Per Week/30 Days Discharge Instructions: Apply Zinc Oxide to periwound with each dressing change Peri-Wound Care: Sween Lotion (Moisturizing lotion) 1 x Per Week/30 Days Discharge Instructions: Apply moisturizing lotion as directed Prim Dressing: IODOFLEX 0.9% Cadexomer Iodine Pad 4x6 cm 1 x Per Week/30 Days ary Discharge Instructions: Apply to wound bed as instructed Secondary Dressing: ABD Pad, 8x10 1 x Per Week/30 Days Discharge Instructions: Apply over primary dressing as directed. Secondary Dressing: CarboFLEX Odor Control Dressing, 4x4 in 1 x Per Week/30 Days Discharge Instructions: Apply over primary dressing as directed. Secondary Dressing: Zetuvit Plus 4x8 in 1 x Per Week/30 Days Discharge Instructions: Apply over primary dressing as directed. Compression Wrap: ThreePress (3 layer compression wrap) 1 x Per Week/30 Days Discharge Instructions: Apply three layer compression as directed. Wound #9 - T Second oe Wound Laterality: Right Cleanser: Wound Cleanser Every Other Day/30 Days Discharge Instructions: Cleanse the wound with wound cleanser prior to applying a clean dressing using gauze sponges, not tissue or cotton balls. Prim Dressing: KerraCel Ag Gelling Fiber Dressing, 2x2 in (silver alginate) Every Other Day/30 Days ary Discharge Instructions: Apply silver alginate to wound bed as instructed Secondary Dressing: Woven Gauze Sponges 2x2 in Every Other Day/30 Days Discharge Instructions: Apply over primary dressing as  directed. Secured With: 3M Medipore Public affairs consultant Surgical T 2x10 (in/yd) Every Other Day/30 Days ape Discharge Instructions: Secure with tape as directed. Patient Medications llergies: latex, ACE Inhibitors, penicillin A Notifications Medication Indication Start End lidocaine DOSE topical 5 % gel - gel topical apply only in clinic Electronic Signature(s) Signed: 08/04/2021 4:38:07 PM By: Linton Ham MD Signed: 08/04/2021 5:11:09 PM By: Deon Pilling RN, BSN Entered By: Deon Pilling on 08/04/2021 10:55:37 Prescription 08/04/2021 Sw[facility] Tarri Fuller -------------------------------------------------------------------------------- , Linton Ham MD Patient Name: Provider: Feb 13, 1955 [id number] Date of Birth: NPI#: Jerilynn Mages [id number] Sex: DEA #: [phone] Phone #: License #: [place] Patient Address: [address]  [place], [place] [address] [place], [place] [address] [phone] Allergies latex; ACE Inhibitors; penicillin Medication Medication: Route: Strength: Form: lidocaine topical 5% gel Class: TOPICAL LOCAL ANESTHETICS Dose: Frequency / Time: Indication: gel topical apply only in clinic Number of Refills: Number of Units: 0 Generic Substitution: Start Date: End Date: Administered at Facility: Substitution Permitted Yes Time Administered: Time Discontinued: Note to Pharmacy: Hand Signature: Date(s): Electronic Signature(s) Signed: 08/04/2021 4:38:07 PM By: Linton Ham MD Signed: 08/04/2021 5:11:09 PM By: Deon Pilling RN, BSN Entered By: Deon Pilling on 08/04/2021 10:55:37 -------------------------------------------------------------------------------- Problem List Details Patient Name: Date of Service: Mccallister, [name] 08/04/2021 9:45 A M Medical Record Number: [id number] Patient Account Number: [id number] Date of Birth/Sex: Treating RN: 22-Sep-1955 (66 y.o. [name], [name] Primary  Care Provider: [name] Other Clinician: Referring Provider: Treating Provider/Extender: Fransico Dareen Gutzwiller in Treatment: 0 Active Problems ICD-10 Encounter Code Description Active Date MDM Diagnosis I89.0 Lymphedema, not elsewhere classified 08/04/2021 No Yes I87.333 Chronic venous hypertension (idiopathic) with ulcer and inflammation of 08/04/2021 No Yes bilateral lower extremity L97.828 Non-pressure chronic ulcer of other part of left lower leg with other specified 08/04/2021 No Yes severity L97.818 Non-pressure chronic ulcer of other part of right lower leg with other specified 08/04/2021 No Yes severity E11.621 Type 2 diabetes mellitus with foot ulcer 08/04/2021 No Yes L97.518 Non-pressure chronic ulcer of other part of right foot with other specified 08/04/2021 No Yes severity Inactive Problems Resolved Problems Electronic Signature(s) Signed: 08/04/2021 4:38:07 PM By: Linton Ham MD Entered By: Linton Ham on 08/04/2021 12:56:24 -------------------------------------------------------------------------------- Progress Note Details Patient Name: Date of Service: Ingalls, [name] 08/04/2021 9:45 A M Medical Record Number: [id number] Patient Account Number: [id number] Date of Birth/Sex: Treating RN: 27-Jan-1955 (66 y.o. [name] Primary Care Provider: [name] Other Clinician: Referring Provider: Treating Provider/Extender: Fransico Aubriegh Minch in Treatment: 0 Subjective Chief Complaint Information obtained from Patient Patients presents for treatment of an open diabetic ulcer (x2) 08/04/2021; patient returns to clinic with bilateral leg wounds as well as areas on the right foot History of Present Illness (HPI) ADMISSION 03/22/2021 This is a 66 year old man with a past medical history significant for diabetes type 2, congestive heart failure, peripheral arterial disease, morbid obesity, venous insufficiency, and  coronary artery disease. He has been followed by [name] in podiatry, who performed a transmetatarsal amputation on the left foot in August 2022. He had issues healing that wound, but based upon Dr. [name] notes, ultimately the TMA wound healed. During his recovery from that surgery, however, ulcers opened up over the DIP joint of the right second and third toe. These have apparently closed and reopened multiple times. It sounds like one of the issues has been moisture accumulation and maceration of the tissues causing them to reopen. At his last visit with [name], on March 01, 2021, there continues to be problems with moisture and he was referred to wound care for further evaluation and management. He had a formal aortogram with runoff performed prior to his TMA. The findings are copied here: Patient has inline flow to both feet with no significant flow-limiting lesion that would be amenable to percutaneous or open revascularization. He does have an element of small vessel disease and has a short segment occlusion of the distal anterior tibial/dorsalis pedis artery on the left foot but does have posterior tibial artery flow. Would recommend management of wounds with amputation of toes 2 and 3  on the right foot if the wounds do not heal and deteriorate. Transmetatarsal amputation on the left side has as good a blood supply as it is going to get and hopefully this will heal in the future. Formal ABIs were done in January 2023. They are normal bilaterally. ABI Findings: +---------+------------------+-----+----------+--------+ Right Rt Pressure (mmHg)IndexWaveform Comment  +---------+------------------+-----+----------+--------+ Brachial 160    +---------+------------------+-----+----------+--------+ PTA 192 1.06 monophasic  +---------+------------------+-----+----------+--------+ DP 159 0.88 monophasic   +---------+------------------+-----+----------+--------+ Great T oe145 0.80    +---------+------------------+-----+----------+--------+ +--------+------------------+-----+---------+-------+ Left Lt Pressure (mmHg)IndexWaveform Comment +--------+------------------+-----+---------+-------+ [id number]    +--------+------------------+-----+---------+-------+ PTA 204 1.13 triphasic  +--------+------------------+-----+---------+-------+ DP 194 1.07 biphasic   +--------+------------------+-----+---------+-------+ +-------+-----------+-----------+------------+------------+ ABI/TBIT oday's ABIT oday's TBIPrevious ABIPrevious TBI +-------+-----------+-----------+------------+------------+ Right 1.06 0.8 1.26 0.65  +-------+-----------+-----------+------------+------------+ Left 1.13 amputation 1.15 amputation  +-------+-----------+-----------+------------+------------+ Previous ABI on 08/06/20 at [facility] Pedal pressures falsely elevated due to medial calcification. Summary: Right: Resting right ankle-brachial index is within normal range. The right toe-brachial index is normal. Left: Resting left ankle-brachial index is within normal range. READMISSION 08/04/2021 Mr. Ratz is now a 66 year old man who I remember from this clinic many years ago I think he had a right lower extremity predominantly venous wound at the time. He was here for 1 visit in March of this year had wounds on his right second and third toes we apparently dressed them many and they healed so he did not come back. He is listed in [place] is being a diabetic although the patient denies this says he is verified it with his primary doctor. In any case over the last several weeks or so according the patient although these wounds look somewhat more chronic than that he has developed predominantly large wounds on the right medial and right lateral ankle smaller areas on the  left leg and areas on the dorsal aspect of his right second and third toes. Its not clear how he has been dressing these. 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 The patient has known PAD. He had an angiogram in August 2022 at which time he had nonhealing wounds in both feet. On the left his major vessels in the thigh were all patent. He had three-vessel patent to the level of the ankle. He had a very short occlusion in the left anterior tibial. On the right lower extremity the major vessels in his thigh were all patent. He had three-vessel runoff to the foot sluggish filling of the anterior tibial artery. He was felt to have some component of small vessel disease but nothing that was amenable or needed revascularization. It was recommended that he have amputation of the second and third toes on the right foot if they did not heal He has been following with [name] of podiatry. [name] got him juxta lite stockings although I do not think he had them on properly he has uncontrolled edema in both legs Past medical history includes type 2 diabetes [although the patient really denies this], left TMA in 2022,lower extremity wounds in fact attendance at this clinic in 2009-2010, A-fib on Eliquis, chronic venous insufficiency with secondary lymphedema history of non-Hodgkin's lymphoma. Patient History Information obtained from Patient, Chart. Allergies latex (Severity: Severe, Reaction: Hives, Swelling), ACE Inhibitors (Severity: Severe, Reaction: Not Specified), penicillin (Reaction: rash) Family History Hypertension - Mother, Stroke - Siblings. Social History Former smoker - quit - ended on 03/24/2013, Marital Status - Divorced, Alcohol Use - Rarely, Drug Use -  No History, Caffeine Use - Daily - Tea. Medical History Respiratory Patient has history of Sleep Apnea - wears CPAP Cardiovascular Patient has history of  Arrhythmia - Atrial Fibrillation, Congestive Heart Failure, Hypertension, Hypotension, Peripheral Arterial Disease, Peripheral Venous Disease Endocrine Patient has history of Type II Diabetes Denies history of Type I Diabetes Musculoskeletal Patient has history of Gout, Osteoarthritis - Hand Patient is treated with Controlled Diet. Blood sugar is not tested. Hospitalization/Surgery History - 07/2020 left transmet. - 08/2020 aortogram. 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 Pulse regular and within target range for patient.[name] Kitchen Respirations regular, non-labored and within target range.. Temperature is normal and within the target range for the patient.[name] Kitchen Appears in no distress. Vitals Time Taken: 9:51 AM, Height: 71 in, Source: Stated, Weight: 350 lbs, Source: Stated, BMI: 48.8, Temperature: 98.6 F, Pulse: 73 bpm, Respiratory Rate: 20 breaths/min, Blood Pressure: 112/74 mmHg. Cardiovascular Pedal pulses are palpable in his feet.[name] Kitchen He has uncontrolled swelling in both lower extremities in fact the swelling extends up into his thighs posteriorly.. General Notes: Wound exam; he has small open areas on the left anterior and left medial both of these are nonviable surface. The major wound is on the right medial ankle and a large wound on the right lateral ankle. He has severe chronic venous insufficiency with skin changes associated with this in both legs.  I note the left TMA Integumentary (Hair, Skin) Wound #10 status is Open. Original cause of wound was Gradually Appeared. The date acquired was: 07/16/2021. The wound is located on the Right T Third. oe The wound measures 0.7cm length x 0.5cm width x 0.1cm depth; 0.275cm^2 area and 0.027cm^3 volume. There is Fat Layer (Subcutaneous Tissue) exposed. There is no tunneling or undermining noted. There is a medium amount of serosanguineous drainage noted. The wound margin is distinct with the outline attached to the wound base. There is small (1-33%) red granulation within the wound bed. There is a large (67-100%) amount of necrotic tissue within the wound bed including Adherent Slough. Wound #11 status is Open. Original cause of wound was Gradually Appeared. The date acquired was: 07/22/2021. The wound is located on the Left,Medial Lower Leg. The wound measures 2.8cm length x 2.5cm width x 0.1cm depth; 5.498cm^2 area and 0.55cm^3 volume. There is Fat Layer (Subcutaneous Tissue) exposed. There is no tunneling or undermining noted. There is a large amount of serosanguineous drainage noted. Foul odor after cleansing was noted. The wound margin is distinct with the outline attached to the wound base. There is medium (34-66%) red, pink granulation within the wound bed. There is a medium (34-66%) amount of necrotic tissue within the wound bed including Adherent Slough. Wound #12 status is Open. Original cause of wound was Gradually Appeared. The date acquired was: 07/15/2021. The wound is located on the Left,Anterior Lower Leg. The wound measures 3cm length x 5.8cm width x 0.1cm depth; 13.666cm^2 area and 1.367cm^3 volume. There is Fat Layer (Subcutaneous Tissue) exposed. There is no tunneling or undermining noted. There is a large amount of serosanguineous drainage noted. Foul odor after cleansing was noted. The wound margin is distinct with the outline attached to  the wound base. There is small (1-33%) red  granulation within the wound bed. There is a large (67-100%) amount of necrotic tissue within the wound bed including Adherent Slough. Wound #7 status is Open. Original cause of wound was Blister. The date acquired was: 07/21/2021. The wound is located on the Right,Medial Malleolus. The wound measures 6.5cm length x 7.5cm width x 0.7cm depth; 38.288cm^2 area and 26.802cm^3 volume. There is Fat Layer (Subcutaneous Tissue) exposed. There is no tunneling or undermining noted. There is a large amount of serosanguineous drainage noted. Foul odor after cleansing was noted. The wound margin is distinct with the outline attached to the wound base. There is small (1-33%) pink granulation within the wound bed. There is a large (67-100%) amount of necrotic tissue within the wound bed including Adherent Slough. Wound #8 status is Open. Original cause of wound was Gradually Appeared. The date acquired was: 07/22/2021. The wound is located on the Right,Lateral Malleolus. The wound measures 9.8cm length x 3cm width x 0.2cm depth; 23.091cm^2 area and 4.618cm^3 volume. There is Fat Layer (Subcutaneous Tissue) exposed. There is no tunneling or undermining noted. There is a large amount of serosanguineous drainage noted. Foul odor after cleansing was noted. The wound margin is distinct with the outline attached to the wound base. There is small (1-33%) red, pink granulation within the wound bed. There is a large (67- 100%) amount of necrotic tissue within the wound bed including Adherent Slough. Wound #9 status is Open. Original cause of wound was Gradually Appeared. The date acquired was: 07/15/2021. The wound is located on the Right T Second.oe The wound measures 0.7cm length x 1.2cm width x 0.1cm depth; 0.66cm^2 area and 0.066cm^3 volume. There is Fat Layer (Subcutaneous Tissue) exposed. There is no tunneling or undermining noted. There is a medium amount of serosanguineous drainage noted. Foul odor after cleansing was  noted. The wound margin is distinct with the outline attached to the wound base. There is small (1-33%) red granulation within the wound bed. There is a large (67-100%) amount of necrotic tissue within the wound bed including Adherent Slough. Assessment Active Problems ICD-10 Lymphedema, not elsewhere classified Chronic venous hypertension (idiopathic) with ulcer and inflammation of bilateral lower extremity Non-pressure chronic ulcer of other part of left lower leg with other specified severity Non-pressure chronic ulcer of other part of right lower leg with other specified severity Type 2 diabetes mellitus with foot ulcer Non-pressure chronic ulcer of other part of left foot with other specified severity Procedures Wound #10 Pre-procedure diagnosis of Wound #10 is a Diabetic Wound/Ulcer of the Lower Extremity located on the Right T Third .Severity of Tissue Pre Debridement oe is: Fat layer exposed. There was a Chemical/Enzymatic/Mechanical debridement performed by Deon Pilling, RN.. Other agent used was gauze and wound cleanser. There was no bleeding. The procedure was tolerated well. Post Debridement Measurements: 0.7cm length x 0.5cm width x 0.1cm depth; 0.027cm^3 volume. Character of Wound/Ulcer Post Debridement is stable. Severity of Tissue Post Debridement is: Fat layer exposed. Post procedure Diagnosis Wound #10: Same as Pre-Procedure Wound #11 Pre-procedure diagnosis of Wound #11 is a Venous Leg Ulcer located on the Left,Medial Lower Leg .Severity of Tissue Pre Debridement is: Fat layer exposed. There was a Excisional Skin/Subcutaneous Tissue Debridement with a total area of 7 sq cm performed by [name] Dillon., MD. With the following instrument(s): Curette to remove Viable and Non-Viable tissue/material. Material removed includes Subcutaneous Tissue, Slough, Skin: Dermis, and Skin: Epidermis after achieving pain control using Lidocaine  5% topical ointment. A time out was  conducted at 10:40, prior to the start of the procedure. A Minimum amount of bleeding was controlled with Pressure. The procedure was tolerated well with a pain level of 0 throughout and a pain level of 3 following the procedure. Post Debridement Measurements: 2.8cm length x 2.5cm width x 0.1cm depth; 0.55cm^3 volume. Character of Wound/Ulcer Post Debridement requires further debridement. Severity of Tissue Post Debridement is: Fat layer exposed. Post procedure Diagnosis Wound #11: Same as Pre-Procedure Pre-procedure diagnosis of Wound #11 is a Venous Leg Ulcer located on the Left,Medial Lower Leg . There was a Three Layer Compression Therapy Procedure by Deon Pilling, RN. Post procedure Diagnosis Wound #11: Same as Pre-Procedure Wound #12 Pre-procedure diagnosis of Wound #12 is a Venous Leg Ulcer located on the Left,Anterior Lower Leg .Severity of Tissue Pre Debridement is: Fat layer exposed. There was a Excisional Skin/Subcutaneous Tissue Debridement with a total area of 17.4 sq cm performed by [name] Dillon., MD. With the following instrument(s): Curette to remove Viable and Non-Viable tissue/material. Material removed includes Subcutaneous Tissue, Slough, Skin: Dermis, and Skin: Epidermis after achieving pain control using Lidocaine 5% topical ointment. A time out was conducted at 10:40, prior to the start of the procedure. A Minimum amount of bleeding was controlled with Pressure. The procedure was tolerated well with a pain level of 0 throughout and a pain level of 3 following the procedure. Post Debridement Measurements: 3cm length x 5.8cm width x 0.1cm depth; 1.367cm^3 volume. Character of Wound/Ulcer Post Debridement requires further debridement. Severity of Tissue Post Debridement is: Fat layer exposed. Post procedure Diagnosis Wound #12: Same as Pre-Procedure Pre-procedure diagnosis of Wound #12 is a Venous Leg Ulcer located on the Left,Anterior Lower Leg . There was a Three Layer  Compression Therapy Procedure by Deon Pilling, RN. Post procedure Diagnosis Wound #12: Same as Pre-Procedure Wound #7 Pre-procedure diagnosis of Wound #7 is a Venous Leg Ulcer located on the Right,Medial Malleolus .Severity of Tissue Pre Debridement is: Fat layer exposed. There was a Excisional Skin/Subcutaneous Tissue Debridement with a total area of 48.75 sq cm performed by [name] Dillon., MD. With the following instrument(s): Curette to remove Viable and Non-Viable tissue/material. Material removed includes Subcutaneous Tissue, Slough, Skin: Dermis, and Skin: Epidermis after achieving pain control using Lidocaine 5% topical ointment. A time out was conducted at 10:40, prior to the start of the procedure. A Minimum amount of bleeding was controlled with Pressure. The procedure was tolerated well with a pain level of 0 throughout and a pain level of 3 following the procedure. Post Debridement Measurements: 6.5cm length x 7.5cm width x 0.7cm depth; 26.802cm^3 volume. Character of Wound/Ulcer Post Debridement requires further debridement. Severity of Tissue Post Debridement is: Fat layer exposed. Post procedure Diagnosis Wound #7: Same as Pre-Procedure Pre-procedure diagnosis of Wound #7 is a Venous Leg Ulcer located on the Right,Medial Malleolus . There was a Three Layer Compression Therapy Procedure by Deon Pilling, RN. Post procedure Diagnosis Wound #7: Same as Pre-Procedure Wound #8 Pre-procedure diagnosis of Wound #8 is a Venous Leg Ulcer located on the Right,Lateral Malleolus .Severity of Tissue Pre Debridement is: Fat layer exposed. There was a Excisional Skin/Subcutaneous Tissue Debridement with a total area of 29.4 sq cm performed by [name] Dillon., MD. With the following instrument(s): Curette to remove Viable and Non-Viable tissue/material. Material removed includes Subcutaneous Tissue, Slough, Skin: Dermis, and Skin: Epidermis after achieving pain control using Lidocaine 5%  topical ointment. A time out  was conducted at 10:40, prior to the start of the procedure. A Minimum amount of bleeding was controlled with Pressure. The procedure was tolerated well with a pain level of 0 throughout and a pain level of 3 following the procedure. Post Debridement Measurements: 9.8cm length x 3cm width x 0.2cm depth; 4.618cm^3 volume. Character of Wound/Ulcer Post Debridement requires further debridement. Severity of Tissue Post Debridement is: Fat layer exposed. Post procedure Diagnosis Wound #8: Same as Pre-Procedure Pre-procedure diagnosis of Wound #8 is a Venous Leg Ulcer located on the Right,Lateral Malleolus . There was a Three Layer Compression Therapy Procedure by Deon Pilling, RN. Post procedure Diagnosis Wound #8: Same as Pre-Procedure Wound #9 Pre-procedure diagnosis of Wound #9 is a Diabetic Wound/Ulcer of the Lower Extremity located on the Right T Second .Severity of Tissue Pre Debridement oe is: Fat layer exposed. There was a Chemical/Enzymatic/Mechanical debridement performed by Deon Pilling, RN.. Other agent used was gauze and wound cleanser. There was no bleeding. The procedure was tolerated well. Post Debridement Measurements: 0.7cm length x 1.2cm width x 0.1cm depth; 0.066cm^3 volume. Character of Wound/Ulcer Post Debridement is stable. Severity of Tissue Post Debridement is: Fat layer exposed. Post procedure Diagnosis Wound #9: Same as Pre-Procedure Plan Follow-up Appointments: Return Appointment in 1 week. - Wednesday [facility] and [facility], Overflow room 6 230pm 8/2/223 Other: - [employer] wound care supplies for your toes. Bathing/ Shower/ Hygiene: May shower with protection but do not get wound dressing(s) wet. Edema Control - Lymphedema / SCD / Other: Elevate legs to the level of the heart or above for 30 minutes daily and/or when sitting, a frequency of: - 3-4 times a day throughout the day. Avoid standing for long periods of time. Exercise regularly The  following medication(s) was prescribed: lidocaine topical 5 % gel gel topical apply only in clinic was prescribed at facility WOUND #10: - T Third Wound Laterality: Right oe Cleanser: Wound Cleanser Every Other Day/30 Days Discharge Instructions: Cleanse the wound with wound cleanser prior to applying a clean dressing using gauze sponges, not tissue or cotton balls. Prim Dressing: KerraCel Ag Gelling Fiber Dressing, 2x2 in (silver alginate) Every Other Day/30 Days ary Discharge Instructions: Apply silver alginate to wound bed as instructed Secondary Dressing: Woven Gauze Sponges 2x2 in Every Other Day/30 Days Discharge Instructions: Apply over primary dressing as directed. Secured With: 9M Medipore Public affairs consultant Surgical T 2x10 (in/yd) Every Other Day/30 Days ape Discharge Instructions: Secure with tape as directed. WOUND #11: - Lower Leg Wound Laterality: Left, Medial Peri-Wound Care: Zinc Oxide Ointment 30g tube 1 x Per Week/30 Days Discharge Instructions: Apply Zinc Oxide to periwound with each dressing change Peri-Wound Care: Sween Lotion (Moisturizing lotion) 1 x Per Week/30 Days Discharge Instructions: Apply moisturizing lotion as directed Prim Dressing: IODOFLEX 0.9% Cadexomer Iodine Pad 4x6 cm 1 x Per Week/30 Days ary Discharge Instructions: Apply to wound bed as instructed Secondary Dressing: ABD Pad, 8x10 1 x Per Week/30 Days Discharge Instructions: Apply over primary dressing as directed. Secondary Dressing: CarboFLEX Odor Control Dressing, 4x4 in 1 x Per Week/30 Days Discharge Instructions: Apply over primary dressing as directed. Secondary Dressing: Zetuvit Plus 4x8 in 1 x Per Week/30 Days Discharge Instructions: Apply over primary dressing as directed. Com pression Wrap: ThreePress (3 layer compression wrap) 1 x Per Week/30 Days Discharge Instructions: Apply three layer compression as directed. WOUND #12: - Lower Leg Wound Laterality: Left, Anterior Peri-Wound Care: Zinc  Oxide Ointment 30g tube 1 x Per Week/30 Days Discharge Instructions: Apply Zinc  Oxide to periwound with each dressing change Peri-Wound Care: Sween Lotion (Moisturizing lotion) 1 x Per Week/30 Days Discharge Instructions: Apply moisturizing lotion as directed Prim Dressing: IODOFLEX 0.9% Cadexomer Iodine Pad 4x6 cm 1 x Per Week/30 Days ary Discharge Instructions: Apply to wound bed as instructed Secondary Dressing: ABD Pad, 8x10 1 x Per Week/30 Days Discharge Instructions: Apply over primary dressing as directed. Secondary Dressing: CarboFLEX Odor Control Dressing, 4x4 in 1 x Per Week/30 Days Discharge Instructions: Apply over primary dressing as directed. Secondary Dressing: Zetuvit Plus 4x8 in 1 x Per Week/30 Days Discharge Instructions: Apply over primary dressing as directed. Com pression Wrap: ThreePress (3 layer compression wrap) 1 x Per Week/30 Days Discharge Instructions: Apply three layer compression as directed. WOUND #7: - Malleolus Wound Laterality: Right, Medial Peri-Wound Care: Zinc Oxide Ointment 30g tube 1 x Per Week/30 Days Discharge Instructions: Apply Zinc Oxide to periwound with each dressing change Peri-Wound Care: Sween Lotion (Moisturizing lotion) 1 x Per Week/30 Days Discharge Instructions: Apply moisturizing lotion as directed Prim Dressing: IODOFLEX 0.9% Cadexomer Iodine Pad 4x6 cm 1 x Per Week/30 Days ary Discharge Instructions: Apply to wound bed as instructed Secondary Dressing: ABD Pad, 8x10 1 x Per Week/30 Days Discharge Instructions: Apply over primary dressing as directed. Secondary Dressing: CarboFLEX Odor Control Dressing, 4x4 in 1 x Per Week/30 Days Discharge Instructions: Apply over primary dressing as directed. Secondary Dressing: Zetuvit Plus 4x8 in 1 x Per Week/30 Days Discharge Instructions: Apply over primary dressing as directed. Com pression Wrap: ThreePress (3 layer compression wrap) 1 x Per Week/30 Days Discharge Instructions: Apply three  layer compression as directed. WOUND #8: - Malleolus Wound Laterality: Right, Lateral Peri-Wound Care: Zinc Oxide Ointment 30g tube 1 x Per Week/30 Days Discharge Instructions: Apply Zinc Oxide to periwound with each dressing change Peri-Wound Care: Sween Lotion (Moisturizing lotion) 1 x Per Week/30 Days Discharge Instructions: Apply moisturizing lotion as directed Prim Dressing: IODOFLEX 0.9% Cadexomer Iodine Pad 4x6 cm 1 x Per Week/30 Days ary Discharge Instructions: Apply to wound bed as instructed Secondary Dressing: ABD Pad, 8x10 1 x Per Week/30 Days Discharge Instructions: Apply over primary dressing as directed. Secondary Dressing: CarboFLEX Odor Control Dressing, 4x4 in 1 x Per Week/30 Days Discharge Instructions: Apply over primary dressing as directed. Secondary Dressing: Zetuvit Plus 4x8 in 1 x Per Week/30 Days Discharge Instructions: Apply over primary dressing as directed. Com pression Wrap: ThreePress (3 layer compression wrap) 1 x Per Week/30 Days Discharge Instructions: Apply three layer compression as directed. WOUND #9: - T Second Wound Laterality: Right oe Cleanser: Wound Cleanser Every Other Day/30 Days Discharge Instructions: Cleanse the wound with wound cleanser prior to applying a clean dressing using gauze sponges, not tissue or cotton balls. Prim Dressing: KerraCel Ag Gelling Fiber Dressing, 2x2 in (silver alginate) Every Other Day/30 Days ary Discharge Instructions: Apply silver alginate to wound bed as instructed Secondary Dressing: Woven Gauze Sponges 2x2 in Every Other Day/30 Days Discharge Instructions: Apply over primary dressing as directed. Secured With: 82M Medipore Public affairs consultant Surgical T 2x10 (in/yd) Every Other Day/30 Days ape Discharge Instructions: Secure with tape as directed. 1. On his legs extensive debridement with an open curette. Especially the wound on the right medial and right lateral lower leg. 2. We will use Iodoflex to all wound areas on  the legs and silver alginate to his toes 3. I elected to use 3 layer compression bilaterally 4. Prolonged discussion with the patient and his friend who is present about trying to  keep these legs elevated while he is working. He is planning trips to [place] I think next month I emphasized that this could be worse if we do not control the swelling. 5. His edema actually extends up into his thighs which I think is pitting. They have already contacted his cardiologist I think to see about adjustment in medications. I did not review his cardiac history although he does have A-fib on Eliquis. I certainly support contacting cardiology 6. The toe wounds dorsally actually do not look too bad. Certainly do not look like there is an urgent need of amputation 7. The patient has had angiograms done as noted in 2022 he does have some degree of small vessel disease especially in the right foot we will have to see how he does. 8. I am concerned that the wounds on his right leg may become circumferential if we do not get the edema under control and I talked to the patient's about this. Electronic Signature(s) Signed: 08/04/2021 4:38:07 PM By: Linton Ham MD Entered By: Linton Ham on 08/04/2021 12:55:21 -------------------------------------------------------------------------------- HxROS Details Patient Name: Date of Service: [place], [name] 08/04/2021 9:45 A M Medical Record Number: [id number] Patient Account Number: [id number] Date of Birth/Sex: Treating RN: [birth year] (66 y.o. 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 Respiratory Medical History: Positive for: Sleep Apnea - wears CPAP Past Medical  History Notes: ARDS, Recurrent pneumonia, hypoxic pulmonary HTN Cardiovascular Medical History: Positive for: Arrhythmia - Atrial Fibrillation; Congestive Heart Failure; Hypertension; Hypotension; Peripheral Arterial Disease; Peripheral Venous Disease Past Medical History Notes: Acute/systolic (congestive) heart failure, cardiomyopathy, type 2 diabetes mellitus with peripheral angiopathy Gastrointestinal Medical History: Past Medical History Notes: Dysphagia Endocrine Medical History: Positive for: Type II Diabetes Negative for: Type I Diabetes Treated with: Diet Blood sugar tested every day: No Genitourinary Medical History: Past Medical History Notes: UTI due to extended-spectrum beta lactamase (ESBL) producing Escherichia coli Integumentary (Skin) Medical History: Past Medical History Notes: Psoriasis Musculoskeletal Medical History: Positive for: Gout; Osteoarthritis - Hand Oncologic Medical History: Past Medical History Notes: Non Hodgkins Lymphoma Immunizations Pneumococcal Vaccine: Received Pneumococcal Vaccination: Yes Received Pneumococcal Vaccination On or After 60th Birthday: No Implantable Devices No devices added Hospitalization / Surgery History Type of Hospitalization/Surgery 07/2020 left transmet 08/2020 aortogram Family and Social History Hypertension: Yes - Mother; Stroke: Yes - Siblings; Former smoker - quit - ended on 03/24/2013; Marital Status - Divorced; Alcohol Use: Rarely; Drug Use: No History; Caffeine Use: Daily - T Financial Concerns: No; Food, Clothing or Shelter Needs: No; Support System Lacking: No; Transportation Concerns: No Loss adjuster, chartered) Signed: 08/04/2021 4:38:07 PM By: Linton Ham MD Signed: 08/04/2021 5:11:09 PM By: Deon Pilling RN, BSN Entered By: Deon Pilling on 08/04/2021 10:42:08 -------------------------------------------------------------------------------- SuperBill Details Patient Name: Date of  Service: [place], [name] 08/04/2021 Medical Record Number: [id number] Patient Account Number: [id number] Date of Birth/Sex: Treating RN: December 28, 1955 (66 y.o. [name] Primary Care Provider: [name] Other Clinician: Referring Provider: Treating Provider/Extender: Fransico Daquann Merriott in Treatment: 0 Diagnosis Coding ICD-10 Codes Code Description I89.0 Lymphedema, not elsewhere classified I87.333 Chronic venous hypertension (idiopathic) with ulcer and inflammation of bilateral lower extremity L97.828 Non-pressure chronic ulcer of other part of left lower leg with other specified severity L97.818 Non-pressure chronic ulcer of other part of  right lower leg with other specified severity E11.621 Type 2 diabetes mellitus with foot ulcer L97.518 Non-pressure chronic ulcer of other part of right foot with other specified severity Facility Procedures CPT4 Code: [id number] Description: 26333 - DEB SUBQ TISSUE 20 SQ CM/< ICD-10 Diagnosis Description L97.818 Non-pressure chronic ulcer of other part of right lower leg with other specified L97.828 Non-pressure chronic ulcer of other part of left lower leg with other specified  s Modifier: severity everity Quantity: 1 CPT4 Code: 54562563 Description: 89373 - DEB SUBQ TISS EA ADDL 20CM ICD-10 Diagnosis Description L97.818 Non-pressure chronic ulcer of other part of right lower leg with other specified L97.828 Non-pressure chronic ulcer of other part of left lower leg with other specified  s Modifier: severity everity Quantity: 5 CPT4 Code: 42876811 Description: 57262 - DEBRIDE W/O ANES NON SELECT Modifier: Quantity: 1 Physician Procedures : CPT4 Code Description Modifier [id number] 16384 - WC PHYS LEVEL 5 - EST PT 25 ICD-10 Diagnosis Description I87.333 Chronic venous hypertension (idiopathic) with ulcer and inflammation of bilateral lower extremity L97.828 Non-pressure chronic ulcer of  other part of left lower leg with  other specified severity L97.818 Non-pressure chronic ulcer of other part of right lower leg with other specified severity L97.518 Non-pressure chronic ulcer of other part of right foot with other specified severity Quantity: 1 : [id number] 11042 - WC PHYS SUBQ TISS 20 SQ CM ICD-10 Diagnosis Description L97.818 Non-pressure chronic ulcer of other part of right lower leg with other specified severity L97.828 Non-pressure chronic ulcer of other part of left lower leg with other  specified severity Quantity: 1 Electronic Signature(s) Signed: 08/04/2021 4:38:07 PM By: Linton Ham MD Entered By: Linton Ham on 08/04/2021 12:57:54

## 2021-08-04 NOTE — Progress Notes (Signed)
[name] ([id number]) , Visit Report for 08/04/2021 Allergy List Details Patient Name: Date of Service: [name] 08/04/2021 9:45 A M Medical Record Number: [id number] Patient Account Number: [id number] Date of Birth/Sex: Treating RN: 22-May-1955 (66 y.o. [name] Primary Care Matilyn Fehrman: [name] Other Clinician: Referring Odilia Damico: Treating Guiselle Mian/Extender: Fransico Michael in Treatment: 0 Allergies Active Allergies latex Reaction: Hives, Swelling Severity: Severe Active: 04/12/2017 ACE Inhibitors Reaction: Not Specified Severity: Severe Active: 10/16/2020 penicillin Reaction: rash Allergy Notes Electronic Signature(s) Signed: 08/04/2021 5:11:09 PM By: Deon Pilling RN, BSN Entered By: Deon Pilling on 08/03/2021 10:38:35 -------------------------------------------------------------------------------- Arrival Information Details Patient Name: Date of Service: [name], [name] 08/04/2021 9:45 A M Medical Record Number: [id number] Patient Account Number: [id number] Date of Birth/Sex: Treating RN: [birth year] (66 y.o. [name], [name] Primary Care Melody Cirrincione: [name] Other Clinician: Referring Jayleen Afonso: Treating Athziri Freundlich/Extender: Fransico Michael in Treatment: 0 Visit Information Patient Arrived: Wheel Chair Arrival Time: 09:51 Accompanied By: friend [name] Transfer Assistance: Manual Patient Identification Verified: Yes Secondary Verification Process Completed: Yes Patient Requires Transmission-Based Precautions: No Patient Has Alerts: Yes Patient Alerts: Patient on Blood Thinner 01/2021 ABI L 1.13 R 1.06 01/2021 TBI L amp R 0.8 History Since Last Visit Added or deleted any medications: No Any new allergies or adverse reactions: No Had a fall or experienced change in activities of daily living that may affect risk of falls: No Signs or symptoms of abuse/neglect since last visito No Hospitalized since  last visit: No Implantable device outside of the clinic excluding cellular tissue based products placed in the center since last visit: No Has Dressing in Place as Prescribed: Yes Has Footwear/Offloading in Place as Prescribed: Yes Left: Surgical Shoe with Pressure Relief Insole Pain Present Now: Yes Electronic Signature(s) Signed: 08/04/2021 5:11:09 PM By: Deon Pilling RN, BSN Entered By: Deon Pilling on 08/04/2021 09:57:11 -------------------------------------------------------------------------------- Clinic Level of Care Assessment Details Patient Name: Date of Service: [name] 08/04/2021 9:45 A M Medical Record Number: [id number] Patient Account Number: [id number] Date of Birth/Sex: Treating RN: [birth year] (66 y.o. [name] Primary Care Nevaan Bunton: [name] Other Clinician: Referring Pollie Poma: Treating Loida Calamia/Extender: Fransico Michael in Treatment: 0 Clinic Level of Care Assessment Items TOOL 1 Quantity Score X- 1 0 Use when EandM and Procedure is performed on INITIAL visit ASSESSMENTS - Nursing Assessment / Reassessment X- 1 20 General Physical Exam (combine w/ comprehensive assessment (listed just below) when performed on new pt. evals) X- 1 25 Comprehensive Assessment (HX, ROS, Risk Assessments, Wounds Hx, etc.) ASSESSMENTS - Wound and Skin Assessment / Reassessment X- 1 10 Dermatologic / Skin Assessment (not related to wound area) ASSESSMENTS - Ostomy and/or Continence Assessment and Care []  - 0 Incontinence Assessment and Management []  - 0 Ostomy Care Assessment and Management (repouching, etc.) PROCESS - Coordination of Care []  - 0 Simple Patient / Family Education for ongoing care X- 1 20 Complex (extensive) Patient / Family Education for ongoing care X- 1 10 Staff obtains Programmer, systems, Records, T Results / Process Orders est []  - 0 Staff telephones HHA, Nursing Homes / Clarify orders / etc []  - 0 Routine Transfer to  another Facility (non-emergent condition) []  - 0 Routine Hospital Admission (non-emergent condition) X- 1 15 New Admissions / Biomedical engineer / Ordering NPWT Apligraf, etc. , []  - 0 Emergency Hospital Admission (emergent condition) PROCESS - Special Needs []  - 0 Pediatric / Minor Patient Management []  - 0 Isolation Patient  Management []  - 0 Hearing / Language / Visual special needs []  - 0 Assessment of Community assistance (transportation, D/C planning, etc.) []  - 0 Additional assistance / Altered mentation []  - 0 Support Surface(s) Assessment (bed, cushion, seat, etc.) INTERVENTIONS - Miscellaneous []  - 0 External ear exam []  - 0 Patient Transfer (multiple staff / Civil Service fast streamer / Similar devices) []  - 0 Simple Staple / Suture removal (25 or less) []  - 0 Complex Staple / Suture removal (26 or more) []  - 0 Hypo/Hyperglycemic Management (do not check if billed separately) []  - 0 Ankle / Brachial Index (ABI) - do not check if billed separately Has the patient been seen at the hospital within the last three years: Yes Total Score: 100 Level Of Care: New/Established - Level 3 Electronic Signature(s) Signed: 08/04/2021 5:11:09 PM By: Deon Pilling RN, BSN Entered By: Deon Pilling on 08/04/2021 10:55:58 -------------------------------------------------------------------------------- Compression Therapy Details Patient Name: Date of Service: [name], [name] 08/04/2021 9:45 A M Medical Record Number: [id number] Patient Account Number: [id number] Date of Birth/Sex: Treating RN: 10-Jan-1956 (66 y.o. [name] Primary Care Deion Swift: [name] Other Clinician: Referring Marvette Schamp: Treating Aimar Shrewsbury/Extender: Fransico Michael in Treatment: 0 Compression Therapy Performed for Wound Assessment: Wound #11 Left,Medial Lower Leg Performed By: Clinician Deon Pilling, RN Compression Type: Three Layer Post Procedure Diagnosis Same as  Pre-procedure Electronic Signature(s) Signed: 08/04/2021 5:11:09 PM By: Deon Pilling RN, BSN Entered By: Deon Pilling on 08/04/2021 10:52:59 -------------------------------------------------------------------------------- Compression Therapy Details Patient Name: Date of Service: [name] 08/04/2021 9:45 A M Medical Record Number: [id number] Patient Account Number: [id number] Date of Birth/Sex: Treating RN: 1955-07-06 (66 y.o. [name] Primary Care Raziel Koenigs: [name] Other Clinician: Referring Elea Holtzclaw: Treating Rondo Spittler/Extender: Fransico Michael in Treatment: 0 Compression Therapy Performed for Wound Assessment: Wound #7 Right,Medial Malleolus Performed By: Clinician Deon Pilling, RN Compression Type: Three Layer Post Procedure Diagnosis Same as Pre-procedure Electronic Signature(s) Signed: 08/04/2021 5:11:09 PM By: Deon Pilling RN, BSN Entered By: Deon Pilling on 08/04/2021 10:52:59 -------------------------------------------------------------------------------- Compression Therapy Details Patient Name: Date of Service: [name] 08/04/2021 9:45 A M Medical Record Number: [id number] Patient Account Number: [id number] Date of Birth/Sex: Treating RN: 1955/10/27 (66 y.o. [name] Primary Care Daejon Lich: [name] Other Clinician: Referring Damen Windsor: Treating Adriane Guglielmo/Extender: Fransico Michael in Treatment: 0 Compression Therapy Performed for Wound Assessment: Wound #12 Left,Anterior Lower Leg Performed By: Clinician Deon Pilling, RN Compression Type: Three Layer Post Procedure Diagnosis Same as Pre-procedure Electronic Signature(s) Signed: 08/04/2021 5:11:09 PM By: Deon Pilling RN, BSN Entered By: Deon Pilling on 08/04/2021 10:52:59 -------------------------------------------------------------------------------- Compression Therapy Details Patient Name: Date of Service: [name]  08/04/2021 9:45 A M Medical Record Number: [id number] Patient Account Number: [id number] Date of Birth/Sex: Treating RN: [birth year] (66 y.o. [name] Primary Care Trigg Delarocha: [name] Other Clinician: Referring Keandria Berrocal: Treating Malaiya Paczkowski/Extender: Fransico Michael in Treatment: 0 Compression Therapy Performed for Wound Assessment: Wound #8 Right,Lateral Malleolus Performed By: Clinician Deon Pilling, RN Compression Type: Three Layer Post Procedure Diagnosis Same as Pre-procedure Electronic Signature(s) Signed: 08/04/2021 5:11:09 PM By: Deon Pilling RN, BSN Entered By: Deon Pilling on 08/04/2021 10:52:59 -------------------------------------------------------------------------------- Encounter Discharge Information Details Patient Name: Date of Service: [place]na[place]on, [name] 08/04/2021 9:45 A M Medical Record Number: [id number] Patient Account Number: [id number] Date of Birth/Sex: Treating RN: [birth year] (66 y.o. [name] Primary Care Elysha Daw: [name] Other Clinician: Referring Solstice Lastinger: Treating Taj Nevins/Extender:  Fransico Michael in Treatment: 0 Encounter Discharge Information Items Post Procedure Vitals Discharge Condition: Stable Temperature (F): 98.6 Ambulatory Status: Wheelchair Pulse (bpm): 73 Discharge Destination: Home Respiratory Rate (breaths/min): 20 Transportation: Private Auto Blood Pressure (mmHg): 112/74 Accompanied By: friend Schedule Follow-up Appointment: Yes Clinical Summary of Care: Electronic Signature(s) Signed: 08/04/2021 5:11:09 PM By: Deon Pilling RN, BSN Entered By: Deon Pilling on 08/04/2021 10:56:52 -------------------------------------------------------------------------------- Lower Extremity Assessment Details Patient Name: Date of Service: Salah, [name] 08/04/2021 9:45 A M Medical Record Number: [id number] Patient Account Number: [id number] Date of Birth/Sex: Treating  RN: 11-24-1955 (66 y.o. [name] Primary Care Chrissi Crow: [name] Other Clinician: Referring Alexianna Nachreiner: Treating Candido Flott/Extender: Fransico Michael in Treatment: 0 Edema Assessment Assessed: [name]: Yes] [name]: Yes] Edema: [Left: Yes] [Right: Yes] Calf Left: Right: Point of Measurement: 31 cm From Medial Instep 48.5 cm 45 cm Ankle Left: Right: Point of Measurement: 11 cm From Medial Instep 30 cm 28.5 cm Knee To Floor Left: Right: From Medial Instep 48 cm 48 cm Vascular Assessment Pulses: Dorsalis Pedis Palpable: [Left:Yes] [Right:Yes] Electronic Signature(s) Signed: 08/04/2021 5:11:09 PM By: Deon Pilling RN, BSN Entered By: Deon Pilling on 08/04/2021 10:19:51 -------------------------------------------------------------------------------- Multi Wound Chart Details Patient Name: Date of Service: [name] 08/04/2021 9:45 A M Medical Record Number: [id number] Patient Account Number: [id number] Date of Birth/Sex: Treating RN: Aug 05, 1955 (66 y.o. [name], [name] Primary Care Edona Schreffler: [name] Other Clinician: Referring Midge Momon: Treating Aprel Egelhoff/Extender: Fransico Michael in Treatment: 0 Vital Signs Height(in): 73 Pulse(bpm): 68 Weight(lbs): 350 Blood Pressure(mmHg): 112/74 Body Mass Index(BMI): 48.8 Temperature(F): 98.6 Respiratory Rate(breaths/min): 20 Photos: Right T Third oe Left, Medial Lower Leg Left, Anterior Lower Leg Wound Location: Gradually Appeared Gradually Appeared Gradually Appeared Wounding Event: Diabetic Wound/Ulcer of the Lower Venous Leg Ulcer Venous Leg Ulcer Primary Etiology: Extremity N/A Lymphedema Lymphedema Secondary Etiology: Sleep Apnea, Arrhythmia, Congestive Sleep Apnea, Arrhythmia, Congestive Sleep Apnea, Arrhythmia, Congestive Comorbid History: Heart Failure, Hypertension, Heart Failure, Hypertension, Heart Failure, Hypertension, Hypotension, Peripheral  Arterial Hypotension, Peripheral Arterial Hypotension, Peripheral Arterial Disease, Peripheral Venous Disease, Disease, Peripheral Venous Disease, Disease, Peripheral Venous Disease, Type II Diabetes, Gout, Osteoarthritis Gout, Osteoarthritis Gout, Osteoarthritis 07/16/2021 07/22/2021 07/15/2021 Date Acquired: 0 0 0 Weeks of Treatment: Open Open Open Wound Status: No No No Wound Recurrence: No No No Clustered Wound: N/A N/A N/A Clustered Quantity: 0.7x0.5x0.1 2.8x2.5x0.1 3x5.8x0.1 Measurements L x W x D (cm) 0.275 5.498 13.666 A (cm) : rea 0.027 0.55 1.367 Volume (cm) : 0.00% 0.00% 0.00% % Reduction in Area: 0.00% 0.00% 0.00% % Reduction in Volume: Grade 2 Full Thickness Without Exposed Full Thickness Without Exposed Classification: Support Structures Support Structures Medium Large Large Exudate Amount: Serosanguineous Serosanguineous Serosanguineous Exudate Type: red, brown red, brown red, brown Exudate Color: No Yes Yes Foul Odor A Cleansing: fter N/A No No Odor Anticipated Due to Product Use: Distinct, outline attached Distinct, outline attached Distinct, outline attached Wound Margin: Small (1-33%) Medium (34-66%) Small (1-33%) Granulation A mount: Red Red, Pink Red Granulation Quality: Large (67-100%) Medium (34-66%) Large (67-100%) Necrotic Amount: Fat Layer (Subcutaneous Tissue): Yes Fat Layer (Subcutaneous Tissue): Yes Fat Layer (Subcutaneous Tissue): Yes Exposed Structures: Fascia: No Fascia: No Fascia: No Tendon: No Tendon: No Tendon: No Muscle: No Muscle: No Muscle: No Joint: No Joint: No Joint: No Bone: No Bone: No Bone: No Small (1-33%) Small (1-33%) Small (1-33%) Epithelialization: Chemical/Enzymatic/Mechanical Debridement - Excisional Debridement - Excisional Debridement: Pre-procedure Verification/Time Out N/A 10:40 10:40 Taken:  N/A Lidocaine 5% topical ointment Lidocaine 5% topical ointment Pain Control: N/A Subcutaneous, Slough  Subcutaneous, Slough Tissue Debrided: N/A Skin/Subcutaneous Tissue Skin/Subcutaneous Tissue Level: N/A 7 17.4 Debridement A (sq cm): rea N/A Curette Curette Instrument: None Minimum Minimum Bleeding: N/A Pressure Pressure Hemostasis A chieved: N/A 0 0 Procedural Pain: N/A 3 3 Post Procedural Pain: Procedure was tolerated well Procedure was tolerated well Procedure was tolerated well Debridement Treatment Response: 0.7x0.5x0.1 2.8x2.5x0.1 3x5.8x0.1 Post Debridement Measurements L x W x D (cm) 0.027 0.55 1.367 Post Debridement Volume: (cm) Debridement Compression Therapy Compression Therapy Procedures Performed: Debridement Debridement Wound Number: [id number]  Photos: Right, Medial Malleolus Right, Lateral Malleolus Right T Second oe Wound Location: Blister Gradually Appeared Gradually Appeared Wounding Event: Venous Leg Ulcer Venous Leg Ulcer Diabetic Wound/Ulcer of the Lower Primary Etiology: Extremity Lymphedema Lymphedema N/A Secondary Etiology: Sleep Apnea, Arrhythmia, Congestive Sleep Apnea, Arrhythmia, Congestive Sleep Apnea, Arrhythmia, Congestive Comorbid History: Heart Failure, Hypertension, Heart Failure, Hypertension, Heart Failure, Hypertension, Hypotension, Peripheral Arterial Hypotension, Peripheral Arterial Hypotension, Peripheral Arterial Disease, Peripheral Venous Disease, Disease, Peripheral Venous Disease, Disease, Peripheral Venous Disease, Gout, Osteoarthritis Gout, Osteoarthritis Type II Diabetes, Gout, Osteoarthritis 07/21/2021 07/22/2021 07/15/2021 Date Acquired: 0 0 0 Weeks of Treatment: Open Open Open Wound Status: No No No Wound Recurrence: No Yes No Clustered Wound: N/A 3 N/A Clustered Quantity: 6.5x7.5x0.7 9.8x3x0.2 0.7x1.2x0.1 Measurements L x W x D (cm) 38.288 23.091 0.66 A (cm) : rea 26.802 4.618 0.066 Volume (cm) : 0.00% 0.00% 0.00% % Reduction in Area: 0.00% 0.00% 0.00% % Reduction in Volume: Full Thickness With Exposed  Support Full Thickness Without Exposed Grade 2 Classification: Structures Support Structures Large Large Medium Exudate Amount: Serosanguineous Serosanguineous Serosanguineous Exudate Type: red, brown red, brown red, brown Exudate Color: Yes Yes Yes Foul Odor A Cleansing: fter No No No Odor Anticipated Due to Product Use: Distinct, outline attached Distinct, outline attached Distinct, outline attached Wound Margin: Small (1-33%) Small (1-33%) Small (1-33%) Granulation A mount: Pink Red, Pink Red Granulation Quality: Large (67-100%) Large (67-100%) Large (67-100%) Necrotic Amount: Fat Layer (Subcutaneous Tissue): Yes Fat Layer (Subcutaneous Tissue): Yes Fat Layer (Subcutaneous Tissue): Yes Exposed Structures: Fascia: No Fascia: No Fascia: No Tendon: No Tendon: No Tendon: No Muscle: No Muscle: No Muscle: No Joint: No Joint: No Joint: No Bone: No Bone: No Bone: No None Small (1-33%) Small (1-33%) Epithelialization: Debridement - Excisional Debridement - Excisional Chemical/Enzymatic/Mechanical Debridement: Pre-procedure Verification/Time Out 10:40 10:40 N/A Taken: Lidocaine 5% topical ointment Lidocaine 5% topical ointment N/A Pain Control: Subcutaneous, Slough Subcutaneous, Slough N/A Tissue Debrided: Skin/Subcutaneous Tissue Skin/Subcutaneous Tissue N/A Level: 48.75 29.4 N/A Debridement A (sq cm): rea Curette Curette N/A Instrument: Minimum Minimum None Bleeding: Pressure Pressure N/A Hemostasis A chieved: 0 0 N/A Procedural Pain: 3 3 N/A Post Procedural Pain: Procedure was tolerated well Procedure was tolerated well Procedure was tolerated well Debridement Treatment Response: 6.5x7.5x0.7 9.8x3x0.2 0.7x1.2x0.1 Post Debridement Measurements L x W x D (cm) 26.802 4.618 0.066 Post Debridement Volume: (cm) Compression Therapy Compression Therapy Debridement Procedures Performed: Debridement Debridement Treatment Notes Wound #10 (Toe Third) Wound  Laterality: Right Cleanser Wound Cleanser Discharge Instruction: Cleanse the wound with wound cleanser prior to applying a clean dressing using gauze sponges, not tissue or cotton balls. Peri-Wound Care Topical Primary Dressing KerraCel Ag Gelling Fiber Dressing, 2x2 in (silver alginate) Discharge Instruction: Apply silver alginate to wound bed as instructed Secondary Dressing Woven Gauze Sponges 2x2 in Discharge Instruction: Apply over primary dressing as directed. Secured With 09/15/2021 Surgical  T 2x10 (in/yd) ape Discharge Instruction: Secure with tape as directed. Compression Wrap Compression Stockings Add-Ons Wound #11 (Lower Leg) Wound Laterality: Left, Medial Cleanser Peri-Wound Care Zinc Oxide Ointment 30g tube Discharge Instruction: Apply Zinc Oxide to periwound with each dressing change Sween Lotion (Moisturizing lotion) Discharge Instruction: Apply moisturizing lotion as directed Topical Primary Dressing IODOFLEX 0.9% Cadexomer Iodine Pad 4x6 cm Discharge Instruction: Apply to wound bed as instructed Secondary Dressing ABD Pad, 8x10 Discharge Instruction: Apply over primary dressing as directed. CarboFLEX Odor Control Dressing, 4x4 in Discharge Instruction: Apply over primary dressing as directed. Zetuvit Plus 4x8 in Discharge Instruction: Apply over primary dressing as directed. Secured With Compression Wrap ThreePress (3 layer compression wrap) Discharge Instruction: Apply three layer compression as directed. Compression Stockings Add-Ons Wound #12 (Lower Leg) Wound Laterality: Left, Anterior Cleanser Peri-Wound Care Zinc Oxide Ointment 30g tube Discharge Instruction: Apply Zinc Oxide to periwound with each dressing change Sween Lotion (Moisturizing lotion) Discharge Instruction: Apply moisturizing lotion as directed Topical Primary Dressing IODOFLEX 0.9% Cadexomer Iodine Pad 4x6 cm Discharge Instruction: Apply to wound bed as  instructed Secondary Dressing ABD Pad, 8x10 Discharge Instruction: Apply over primary dressing as directed. CarboFLEX Odor Control Dressing, 4x4 in Discharge Instruction: Apply over primary dressing as directed. Zetuvit Plus 4x8 in Discharge Instruction: Apply over primary dressing as directed. Secured With Compression Wrap ThreePress (3 layer compression wrap) Discharge Instruction: Apply three layer compression as directed. Compression Stockings Add-Ons Wound #7 (Malleolus) Wound Laterality: Right, Medial Cleanser Peri-Wound Care Zinc Oxide Ointment 30g tube Discharge Instruction: Apply Zinc Oxide to periwound with each dressing change Sween Lotion (Moisturizing lotion) Discharge Instruction: Apply moisturizing lotion as directed Topical Primary Dressing IODOFLEX 0.9% Cadexomer Iodine Pad 4x6 cm Discharge Instruction: Apply to wound bed as instructed Secondary Dressing ABD Pad, 8x10 Discharge Instruction: Apply over primary dressing as directed. CarboFLEX Odor Control Dressing, 4x4 in Discharge Instruction: Apply over primary dressing as directed. Zetuvit Plus 4x8 in Discharge Instruction: Apply over primary dressing as directed. Secured With Compression Wrap ThreePress (3 layer compression wrap) Discharge Instruction: Apply three layer compression as directed. Compression Stockings Add-Ons Wound #8 (Malleolus) Wound Laterality: Right, Lateral Cleanser Peri-Wound Care Zinc Oxide Ointment 30g tube Discharge Instruction: Apply Zinc Oxide to periwound with each dressing change Sween Lotion (Moisturizing lotion) Discharge Instruction: Apply moisturizing lotion as directed Topical Primary Dressing IODOFLEX 0.9% Cadexomer Iodine Pad 4x6 cm Discharge Instruction: Apply to wound bed as instructed Secondary Dressing ABD Pad, 8x10 Discharge Instruction: Apply over primary dressing as directed. CarboFLEX Odor Control Dressing, 4x4 in Discharge Instruction: Apply over  primary dressing as directed. Zetuvit Plus 4x8 in Discharge Instruction: Apply over primary dressing as directed. Secured With Compression Wrap ThreePress (3 layer compression wrap) Discharge Instruction: Apply three layer compression as directed. Compression Stockings Add-Ons Wound #9 (Toe Second) Wound Laterality: Right Cleanser Wound Cleanser Discharge Instruction: Cleanse the wound with wound cleanser prior to applying a clean dressing using gauze sponges, not tissue or cotton balls. Peri-Wound Care Topical Primary Dressing KerraCel Ag Gelling Fiber Dressing, 2x2 in (silver alginate) Discharge Instruction: Apply silver alginate to wound bed as instructed Secondary Dressing Woven Gauze Sponges 2x2 in Discharge Instruction: Apply over primary dressing as directed. Secured With [employer] Surgical T 2x10 (in/yd) ape Discharge Instruction: Secure with tape as directed. Compression Wrap Compression Stockings Add-Ons Electronic Signature(s) Signed: 08/04/2021 4:38:07 PM By: Linton Ham MD Signed: 08/04/2021 5:11:09 PM By: Deon Pilling RN, BSN Entered By: Linton Ham on 08/04/2021 12:43:20 -------------------------------------------------------------------------------- Multi-Disciplinary Care Plan  Details Patient Name: Date of Service: DANEIL, BEEM 08/04/2021 9:45 A M Medical Record Number: [id number] Patient Account Number: [id number] Date of Birth/Sex: Treating RN: Jan 03, 1956 (66 y.o. [name], [name] Primary Care Lorrene Graef: [name] Other Clinician: Referring Luqman Perrelli: Treating Ajax Schroll/Extender: Fransico Michael in Treatment: 0 Active Inactive Orientation to the Wound Care Program Nursing Diagnoses: Knowledge deficit related to the wound healing center program Goals: Patient/caregiver will verbalize understanding of the [facility] Date Initiated: 08/04/2021 Target Resolution Date: 08/12/2021 Goal  Status: Active Interventions: Provide education on orientation to the wound center Notes: Pain, Acute or Chronic Nursing Diagnoses: Pain, acute or chronic: actual or potential Potential alteration in comfort, pain Goals: Patient will verbalize adequate pain control and receive pain control interventions during procedures as needed Date Initiated: 08/04/2021 Target Resolution Date: 09/10/2021 Goal Status: Active Patient/caregiver will verbalize comfort level met Date Initiated: 08/04/2021 Target Resolution Date: 09/08/2021 Goal Status: Active Interventions: Encourage patient to take pain medications as prescribed Provide education on pain management Reposition patient for comfort Treatment Activities: Administer pain control measures as ordered : 08/04/2021 Notes: Venous Leg Ulcer Nursing Diagnoses: Knowledge deficit related to disease process and management Goals: Non-invasive venous studies are completed as ordered Date Initiated: 08/04/2021 Target Resolution Date: 08/13/2021 Goal Status: Active Patient/caregiver will verbalize understanding of disease process and disease management Date Initiated: 08/04/2021 Target Resolution Date: 09/16/2021 Goal Status: Active Interventions: Assess peripheral edema status every visit. Compression as ordered Treatment Activities: Non-invasive vascular studies : 08/04/2021 Notes: Wound/Skin Impairment Nursing Diagnoses: Knowledge deficit related to ulceration/compromised skin integrity Goals: Patient/caregiver will verbalize understanding of skin care regimen Date Initiated: 08/04/2021 Target Resolution Date: 08/13/2021 Goal Status: Active Interventions: Assess patient/caregiver ability to perform ulcer/skin care regimen upon admission and as needed Assess ulceration(s) every visit Provide education on ulcer and skin care Treatment Activities: Skin care regimen initiated : 08/04/2021 Topical wound management initiated :  08/04/2021 Notes: Electronic Signature(s) Signed: 08/04/2021 5:11:09 PM By: Deon Pilling RN, BSN Entered By: Deon Pilling on 08/04/2021 10:41:17 -------------------------------------------------------------------------------- Pain Assessment Details Patient Name: Date of Service: Bullinger, [name] 08/04/2021 9:45 A M Medical Record Number: [id number] Patient Account Number: [id number] Date of Birth/Sex: Treating RN: 08/21/1955 (66 y.o. [name] Primary Care Ricki Vanhandel: [name] Other Clinician: Referring Fortino Haag: Treating Shaneal Barasch/Extender: Fransico Michael in Treatment: 0 Active Problems Location of Pain Severity and Description of Pain Patient Has Paino Yes Site Locations Pain Location: Generalized Pain, Pain in Ulcers Rate the pain. Current Pain Level: 7 Pain Management and Medication Current Pain Management: Medication: No Cold Application: No Rest: No Massage: No Activity: No T.E.N.S.: No Heat Application: No Leg drop or elevation: No Is the Current Pain Management Adequate: Adequate How does your wound impact your activities of daily livingo Sleep: No Bathing: No Appetite: No Relationship With Others: No Bladder Continence: No Emotions: No Bowel Continence: No Work: No Toileting: No Drive: No Dressing: No Hobbies: No Engineer, maintenance) Signed: 08/04/2021 5:11:09 PM By: Deon Pilling RN, BSN Entered By: Deon Pilling on 08/04/2021 09:57:32 -------------------------------------------------------------------------------- Patient/Caregiver Education Details Patient Name: Date of Service: Behrens, [name] 7/26/2023andnbsp9:45 A M Medical Record Number: [id number] Patient Account Number: [id number] Date of Birth/Gender: Treating RN: [birth year] (66 y.o. [name] Primary Care Physician: [name] Other Clinician: Referring Physician: Treating Physician/Extender: Fransico Michael in  Treatment: 0 Education Assessment Education Provided To: Patient Education Topics Provided Welcome T [facility]: o Handouts: Welcome T  [facility] [place] o Methods: Explain/Verbal Responses: Reinforcements needed Electronic Signature(s) Signed: 08/04/2021 5:11:09 PM By: Deon Pilling RN, BSN Signed: 08/04/2021 5:11:09 PM By: Deon Pilling RN, BSN Entered By: Deon Pilling on 08/04/2021 10:41:37 -------------------------------------------------------------------------------- Wound Assessment Details Patient Name: Date of Service: Granberg, [name] 08/04/2021 9:45 A M Medical Record Number: [id number] Patient Account Number: [id number] Date of Birth/Sex: Treating RN: [birth year] (66 y.o. [name], [name] Primary Care Jurgen Groeneveld: [name] Other Clinician: Referring Karina Lenderman: Treating Sharetta Ricchio/Extender: Fransico Michael in Treatment: 0 Wound Status Wound Number: 10 Primary Diabetic Wound/Ulcer of the Lower Extremity Etiology: Wound Location: Right T Third oe Wound Open Wounding Event: Gradually Appeared Status: Date Acquired: 07/16/2021 Comorbid Sleep Apnea, Arrhythmia, Congestive Heart Failure, Hypertension, Weeks Of Treatment: 0 History: Hypotension, Peripheral Arterial Disease, Peripheral Venous Clustered Wound: No Disease, Type II Diabetes, Gout, Osteoarthritis Photos Wound Measurements Length: (cm) 0.7 Width: (cm) 0.5 Depth: (cm) 0.1 Area: (cm) 0.275 Volume: (cm) 0.027 % Reduction in Area: 0% % Reduction in Volume: 0% Epithelialization: Small (1-33%) Tunneling: No Undermining: No Wound Description Classification: Grade 2 Wound Margin: Distinct, outline attached Exudate Amount: Medium Exudate Type: Serosanguineous Exudate Color: red, brown Foul Odor After Cleansing: No Slough/Fibrino Yes Wound Bed Granulation Amount: Small (1-33%) Exposed Structure Granulation Quality: Red Fascia Exposed: No Necrotic Amount: Large  (67-100%) Fat Layer (Subcutaneous Tissue) Exposed: Yes Necrotic Quality: Adherent Slough Tendon Exposed: No Muscle Exposed: No Joint Exposed: No Bone Exposed: No Treatment Notes Wound #10 (Toe Third) Wound Laterality: Right Cleanser Wound Cleanser Discharge Instruction: Cleanse the wound with wound cleanser prior to applying a clean dressing using gauze sponges, not tissue or cotton balls. Peri-Wound Care Topical Primary Dressing KerraCel Ag Gelling Fiber Dressing, 2x2 in (silver alginate) Discharge Instruction: Apply silver alginate to wound bed as instructed Secondary Dressing Woven Gauze Sponges 2x2 in Discharge Instruction: Apply over primary dressing as directed. Secured With [employer] Surgical T 2x10 (in/yd) ape Discharge Instruction: Secure with tape as directed. Compression Wrap Compression Stockings Add-Ons Electronic Signature(s) Signed: 08/04/2021 5:11:09 PM By: Deon Pilling RN, BSN Entered By: Deon Pilling on 08/04/2021 10:42:51 -------------------------------------------------------------------------------- Wound Assessment Details Patient Name: Date of Service: Matney, [name] 08/04/2021 9:45 A M Medical Record Number: [id number] Patient Account Number: [id number] Date of Birth/Sex: Treating RN: 1955/02/27 (66 y.o. [name], [name] Primary Care Shelle Galdamez: [name] Other Clinician: Referring Aigner Horseman: Treating Merian Wroe/Extender: Fransico Michael in Treatment: 0 Wound Status Wound Number: 11 Primary Venous Leg Ulcer Etiology: Wound Location: Left, Medial Lower Leg Secondary Lymphedema Wounding Event: Gradually Appeared Etiology: Date Acquired: 07/22/2021 Wound Open Weeks Of Treatment: 0 Status: Clustered Wound: No Comorbid Sleep Apnea, Arrhythmia, Congestive Heart Failure, Hypertension, History: Hypotension, Peripheral Arterial Disease, Peripheral Venous Disease, Gout, Osteoarthritis Photos Wound  Measurements Length: (cm) 2.8 Width: (cm) 2.5 Depth: (cm) 0.1 Area: (cm) 5.498 Volume: (cm) 0.55 % Reduction in Area: 0% % Reduction in Volume: 0% Epithelialization: Small (1-33%) Tunneling: No Undermining: No Wound Description Classification: Full Thickness Without Exposed Support Structures Wound Margin: Distinct, outline attached Exudate Amount: Large Exudate Type: Serosanguineous Exudate Color: red, brown Foul Odor After Cleansing: Yes Due to Product Use: No Slough/Fibrino Yes Wound Bed Granulation Amount: Medium (34-66%) Exposed Structure Granulation Quality: Red, Pink Fascia Exposed: No Necrotic Amount: Medium (34-66%) Fat Layer (Subcutaneous Tissue) Exposed: Yes Necrotic Quality: Adherent Slough Tendon Exposed: No Muscle Exposed: No Joint Exposed: No Bone Exposed: No Treatment Notes Wound #11 (Lower Leg) Wound Laterality: Left, Medial Cleanser Peri-Wound  Care Zinc Oxide Ointment 30g tube Discharge Instruction: Apply Zinc Oxide to periwound with each dressing change Sween Lotion (Moisturizing lotion) Discharge Instruction: Apply moisturizing lotion as directed Topical Primary Dressing IODOFLEX 0.9% Cadexomer Iodine Pad 4x6 cm Discharge Instruction: Apply to wound bed as instructed Secondary Dressing ABD Pad, 8x10 Discharge Instruction: Apply over primary dressing as directed. CarboFLEX Odor Control Dressing, 4x4 in Discharge Instruction: Apply over primary dressing as directed. Zetuvit Plus 4x8 in Discharge Instruction: Apply over primary dressing as directed. Secured With Compression Wrap ThreePress (3 layer compression wrap) Discharge Instruction: Apply three layer compression as directed. Compression Stockings Add-Ons Electronic Signature(s) Signed: 08/04/2021 5:11:09 PM By: Deon Pilling RN, BSN Entered By: Deon Pilling on 08/04/2021 10:28:06 -------------------------------------------------------------------------------- Wound Assessment  Details Patient Name: Date of Service: Turri, [name] 08/04/2021 9:45 A M Medical Record Number: [id number] Patient Account Number: [id number] Date of Birth/Sex: Treating RN: 1955/04/06 (65 y.o. [name], [name] Primary Care Hades Mathew: [name] Other Clinician: Referring Stephanieann Popescu: Treating Brailon Don/Extender: Fransico Michael in Treatment: 0 Wound Status Wound Number: 12 Primary Venous Leg Ulcer Etiology: Wound Location: Left, Anterior Lower Leg Secondary Lymphedema Wounding Event: Gradually Appeared Etiology: Date Acquired: 07/15/2021 Wound Open Weeks Of Treatment: 0 Status: Clustered Wound: No Comorbid Sleep Apnea, Arrhythmia, Congestive Heart Failure, Hypertension, History: Hypotension, Peripheral Arterial Disease, Peripheral Venous Disease, Gout, Osteoarthritis Photos Wound Measurements Length: (cm) 3 Width: (cm) 5.8 Depth: (cm) 0.1 Area: (cm) 13.666 Volume: (cm) 1.367 % Reduction in Area: 0% % Reduction in Volume: 0% Epithelialization: Small (1-33%) Tunneling: No Undermining: No Wound Description Classification: Full Thickness Without Exposed Support Structures Wound Margin: Distinct, outline attached Exudate Amount: Large Exudate Type: Serosanguineous Exudate Color: red, brown Foul Odor After Cleansing: Yes Due to Product Use: No Slough/Fibrino Yes Wound Bed Granulation Amount: Small (1-33%) Exposed Structure Granulation Quality: Red Fascia Exposed: No Necrotic Amount: Large (67-100%) Fat Layer (Subcutaneous Tissue) Exposed: Yes Necrotic Quality: Adherent Slough Tendon Exposed: No Muscle Exposed: No Joint Exposed: No Bone Exposed: No Treatment Notes Wound #12 (Lower Leg) Wound Laterality: Left, Anterior Cleanser Peri-Wound Care Zinc Oxide Ointment 30g tube Discharge Instruction: Apply Zinc Oxide to periwound with each dressing change Sween Lotion (Moisturizing lotion) Discharge Instruction: Apply moisturizing lotion as  directed Topical Primary Dressing IODOFLEX 0.9% Cadexomer Iodine Pad 4x6 cm Discharge Instruction: Apply to wound bed as instructed Secondary Dressing ABD Pad, 8x10 Discharge Instruction: Apply over primary dressing as directed. CarboFLEX Odor Control Dressing, 4x4 in Discharge Instruction: Apply over primary dressing as directed. Zetuvit Plus 4x8 in Discharge Instruction: Apply over primary dressing as directed. Secured With Compression Wrap ThreePress (3 layer compression wrap) Discharge Instruction: Apply three layer compression as directed. Compression Stockings Add-Ons Electronic Signature(s) Signed: 08/04/2021 5:11:09 PM By: Deon Pilling RN, BSN Entered By: Deon Pilling on 08/04/2021 10:28:21 -------------------------------------------------------------------------------- Wound Assessment Details Patient Name: Date of Service: Lovick, [name] 08/04/2021 9:45 A M Medical Record Number: [id number] Patient Account Number: [id number] Date of Birth/Sex: Treating RN: 05-18-1955 (66 y.o. [name] Primary Care Lynna Zamorano: [name] Other Clinician: Referring Sinclair Arrazola: Treating Mavis Gravelle/Extender: Fransico Michael in Treatment: 0 Wound Status Wound Number: 7 Primary Venous Leg Ulcer Etiology: Wound Location: Right, Medial Malleolus Secondary Lymphedema Wounding Event: Blister Etiology: Date Acquired: 07/21/2021 Wound Open Weeks Of Treatment: 0 Status: Clustered Wound: No Comorbid Sleep Apnea, Arrhythmia, Congestive Heart Failure, Hypertension, History: Hypotension, Peripheral Arterial Disease, Peripheral Venous Disease, Gout, Osteoarthritis Photos Wound Measurements Length: (cm) 6.5 Width: (cm) 7.5 Depth: (cm) 0.7 Area: (  cm) 38.288 Volume: (cm) 26.802 % Reduction in Area: 0% % Reduction in Volume: 0% Epithelialization: None Tunneling: No Undermining: No Wound Description Classification: Full Thickness With Exposed Support  Structures Wound Margin: Distinct, outline attached Exudate Amount: Large Exudate Type: Serosanguineous Exudate Color: red, brown Foul Odor After Cleansing: Yes Due to Product Use: No Slough/Fibrino Yes Wound Bed Granulation Amount: Small (1-33%) Exposed Structure Granulation Quality: Pink Fascia Exposed: No Necrotic Amount: Large (67-100%) Fat Layer (Subcutaneous Tissue) Exposed: Yes Necrotic Quality: Adherent Slough Tendon Exposed: No Muscle Exposed: No Joint Exposed: No Bone Exposed: No Treatment Notes Wound #7 (Malleolus) Wound Laterality: Right, Medial Cleanser Peri-Wound Care Zinc Oxide Ointment 30g tube Discharge Instruction: Apply Zinc Oxide to periwound with each dressing change Sween Lotion (Moisturizing lotion) Discharge Instruction: Apply moisturizing lotion as directed Topical Primary Dressing IODOFLEX 0.9% Cadexomer Iodine Pad 4x6 cm Discharge Instruction: Apply to wound bed as instructed Secondary Dressing ABD Pad, 8x10 Discharge Instruction: Apply over primary dressing as directed. CarboFLEX Odor Control Dressing, 4x4 in Discharge Instruction: Apply over primary dressing as directed. Zetuvit Plus 4x8 in Discharge Instruction: Apply over primary dressing as directed. Secured With Compression Wrap ThreePress (3 layer compression wrap) Discharge Instruction: Apply three layer compression as directed. Compression Stockings Add-Ons Electronic Signature(s) Signed: 08/04/2021 5:11:09 PM By: Deon Pilling RN, BSN Entered By: Deon Pilling on 08/04/2021 10:27:28 -------------------------------------------------------------------------------- Wound Assessment Details Patient Name: Date of Service: Yaffe, [name] 08/04/2021 9:45 A M Medical Record Number: [id number] Patient Account Number: [id number] Date of Birth/Sex: Treating RN: April 12, 1955 (66 y.o. [name], [name] Primary Care Adi Seales: [name] Other Clinician: Referring Connie Hilgert: Treating  Kawhi Diebold/Extender: Fransico Michael in Treatment: 0 Wound Status Wound Number: 8 Primary Venous Leg Ulcer Etiology: Wound Location: Right, Lateral Malleolus Secondary Lymphedema Wounding Event: Gradually Appeared Etiology: Date Acquired: 07/22/2021 Wound Open Weeks Of Treatment: 0 Status: Clustered Wound: Yes Comorbid Sleep Apnea, Arrhythmia, Congestive Heart Failure, Hypertension, History: Hypotension, Peripheral Arterial Disease, Peripheral Venous Disease, Gout, Osteoarthritis Photos Wound Measurements Length: (cm) 9.8 Width: (cm) 3 Depth: (cm) 0.2 Clustered Quantity: 3 Area: (cm) 23.091 Volume: (cm) 4.618 % Reduction in Area: 0% % Reduction in Volume: 0% Epithelialization: Small (1-33%) Tunneling: No Undermining: No Wound Description Classification: Full Thickness Without Exposed Support Structures Wound Margin: Distinct, outline attached Exudate Amount: Large Exudate Type: Serosanguineous Exudate Color: red, brown Foul Odor After Cleansing: Yes Due to Product Use: No Slough/Fibrino Yes Wound Bed Granulation Amount: Small (1-33%) Exposed Structure Granulation Quality: Red, Pink Fascia Exposed: No Necrotic Amount: Large (67-100%) Fat Layer (Subcutaneous Tissue) Exposed: Yes Necrotic Quality: Adherent Slough Tendon Exposed: No Muscle Exposed: No Joint Exposed: No Bone Exposed: No Treatment Notes Wound #8 (Malleolus) Wound Laterality: Right, Lateral Cleanser Peri-Wound Care Zinc Oxide Ointment 30g tube Discharge Instruction: Apply Zinc Oxide to periwound with each dressing change Sween Lotion (Moisturizing lotion) Discharge Instruction: Apply moisturizing lotion as directed Topical Primary Dressing IODOFLEX 0.9% Cadexomer Iodine Pad 4x6 cm Discharge Instruction: Apply to wound bed as instructed Secondary Dressing ABD Pad, 8x10 Discharge Instruction: Apply over primary dressing as directed. CarboFLEX Odor Control Dressing, 4x4  in Discharge Instruction: Apply over primary dressing as directed. Zetuvit Plus 4x8 in Discharge Instruction: Apply over primary dressing as directed. Secured With Compression Wrap ThreePress (3 layer compression wrap) Discharge Instruction: Apply three layer compression as directed. Compression Stockings Add-Ons Electronic Signature(s) Signed: 08/04/2021 5:11:09 PM By: Deon Pilling RN, BSN Entered By: Deon Pilling on 08/04/2021 10:27:50 -------------------------------------------------------------------------------- Wound Assessment Details Patient Name: Date of Service:  Groh, [name] 08/04/2021 9:45 A M Medical Record Number: [id number] Patient Account Number: [id number] Date of Birth/Sex: Treating RN: 11-02-1955 (66 y.o. [name], [name] Primary Care Sahira Cataldi: [name] Other Clinician: Referring Saachi Zale: Treating Gaege Sangalang/Extender: Fransico Michael in Treatment: 0 Wound Status Wound Number: 9 Primary Diabetic Wound/Ulcer of the Lower Extremity Etiology: Wound Location: Right T Second oe Wound Open Wounding Event: Gradually Appeared Status: Date Acquired: 07/15/2021 Comorbid Sleep Apnea, Arrhythmia, Congestive Heart Failure, Hypertension, Weeks Of Treatment: 0 History: Hypotension, Peripheral Arterial Disease, Peripheral Venous Clustered Wound: No Disease, Type II Diabetes, Gout, Osteoarthritis Photos Wound Measurements Length: (cm) 0.7 Width: (cm) 1.2 Depth: (cm) 0.1 Area: (cm) 0.66 Volume: (cm) 0.066 % Reduction in Area: 0% % Reduction in Volume: 0% Epithelialization: Small (1-33%) Tunneling: No Undermining: No Wound Description Classification: Grade 2 Wound Margin: Distinct, outline attached Exudate Amount: Medium Exudate Type: Serosanguineous Exudate Color: red, brown Foul Odor After Cleansing: Yes Due to Product Use: No Slough/Fibrino Yes Wound Bed Granulation Amount: Small (1-33%) Exposed Structure Granulation  Quality: Red Fascia Exposed: No Necrotic Amount: Large (67-100%) Fat Layer (Subcutaneous Tissue) Exposed: Yes Necrotic Quality: Adherent Slough Tendon Exposed: No Muscle Exposed: No Joint Exposed: No Bone Exposed: No Treatment Notes Wound #9 (Toe Second) Wound Laterality: Right Cleanser Wound Cleanser Discharge Instruction: Cleanse the wound with wound cleanser prior to applying a clean dressing using gauze sponges, not tissue or cotton balls. Peri-Wound Care Topical Primary Dressing KerraCel Ag Gelling Fiber Dressing, 2x2 in (silver alginate) Discharge Instruction: Apply silver alginate to wound bed as instructed Secondary Dressing Woven Gauze Sponges 2x2 in Discharge Instruction: Apply over primary dressing as directed. Secured With [employer] Surgical T 2x10 (in/yd) ape Discharge Instruction: Secure with tape as directed. Compression Wrap Compression Stockings Add-Ons Electronic Signature(s) Signed: 08/04/2021 5:11:09 PM By: Deon Pilling RN, BSN Entered By: Deon Pilling on 08/04/2021 10:42:37 -------------------------------------------------------------------------------- Vitals Details Patient Name: Date of Service: Noyce, [name] 08/04/2021 9:45 A M Medical Record Number: [id number] Patient Account Number: [id number] Date of Birth/Sex: Treating RN: 15-May-1955 (66 y.o. [name], [name] Primary Care Lashawn Bromwell: [name] Other Clinician: Referring Brayton Baumgartner: Treating Margurette Brener/Extender: Fransico Michael in Treatment: 0 Vital Signs Time Taken: 09:51 Temperature (F): 98.6 Height (in): 71 Pulse (bpm): 73 Source: Stated Respiratory Rate (breaths/min): 20 Weight (lbs): 350 Blood Pressure (mmHg): 112/74 Source: Stated Reference Range: 80 - 120 mg / dl Body Mass Index (BMI): 48.8 Electronic Signature(s) Signed: 08/04/2021 5:11:09 PM By: Deon Pilling RN, BSN Entered By: Deon Pilling on 08/04/2021 10:03:55

## 2021-08-05 DIAGNOSIS — I83013 Varicose veins of right lower extremity with ulcer of ankle: Secondary | ICD-10-CM | POA: Diagnosis not present

## 2021-08-05 NOTE — Telephone Encounter (Signed)
Called to discuss scheduling the Echocardiogram ordered by Laurann Montana, NP

## 2021-08-10 DIAGNOSIS — I2510 Atherosclerotic heart disease of native coronary artery without angina pectoris: Secondary | ICD-10-CM | POA: Diagnosis not present

## 2021-08-10 DIAGNOSIS — I10 Essential (primary) hypertension: Secondary | ICD-10-CM | POA: Diagnosis not present

## 2021-08-10 DIAGNOSIS — E1151 Type 2 diabetes mellitus with diabetic peripheral angiopathy without gangrene: Secondary | ICD-10-CM | POA: Diagnosis not present

## 2021-08-10 NOTE — Telephone Encounter (Signed)
Left message for patient to call and schedule Echocardiogram ordered by Laurann Montana, NP

## 2021-08-11 ENCOUNTER — Encounter (HOSPITAL_BASED_OUTPATIENT_CLINIC_OR_DEPARTMENT_OTHER): Payer: Medicare Other | Attending: Physician Assistant | Admitting: Physician Assistant

## 2021-08-11 DIAGNOSIS — E1151 Type 2 diabetes mellitus with diabetic peripheral angiopathy without gangrene: Secondary | ICD-10-CM | POA: Diagnosis not present

## 2021-08-11 DIAGNOSIS — L97828 Non-pressure chronic ulcer of other part of left lower leg with other specified severity: Secondary | ICD-10-CM | POA: Diagnosis not present

## 2021-08-11 DIAGNOSIS — L97312 Non-pressure chronic ulcer of right ankle with fat layer exposed: Secondary | ICD-10-CM | POA: Diagnosis not present

## 2021-08-11 DIAGNOSIS — L97822 Non-pressure chronic ulcer of other part of left lower leg with fat layer exposed: Secondary | ICD-10-CM | POA: Diagnosis not present

## 2021-08-11 DIAGNOSIS — Z87891 Personal history of nicotine dependence: Secondary | ICD-10-CM | POA: Diagnosis not present

## 2021-08-11 DIAGNOSIS — E11621 Type 2 diabetes mellitus with foot ulcer: Secondary | ICD-10-CM | POA: Diagnosis not present

## 2021-08-11 DIAGNOSIS — I87333 Chronic venous hypertension (idiopathic) with ulcer and inflammation of bilateral lower extremity: Secondary | ICD-10-CM | POA: Insufficient documentation

## 2021-08-11 DIAGNOSIS — L97818 Non-pressure chronic ulcer of other part of right lower leg with other specified severity: Secondary | ICD-10-CM | POA: Insufficient documentation

## 2021-08-11 DIAGNOSIS — Z8572 Personal history of non-Hodgkin lymphomas: Secondary | ICD-10-CM | POA: Insufficient documentation

## 2021-08-11 DIAGNOSIS — I5022 Chronic systolic (congestive) heart failure: Secondary | ICD-10-CM | POA: Insufficient documentation

## 2021-08-11 DIAGNOSIS — L97518 Non-pressure chronic ulcer of other part of right foot with other specified severity: Secondary | ICD-10-CM | POA: Diagnosis not present

## 2021-08-11 DIAGNOSIS — G4730 Sleep apnea, unspecified: Secondary | ICD-10-CM | POA: Diagnosis not present

## 2021-08-11 DIAGNOSIS — I110 Hypertensive heart disease with heart failure: Secondary | ICD-10-CM | POA: Diagnosis not present

## 2021-08-11 DIAGNOSIS — L97512 Non-pressure chronic ulcer of other part of right foot with fat layer exposed: Secondary | ICD-10-CM | POA: Diagnosis not present

## 2021-08-11 DIAGNOSIS — I890 Lymphedema, not elsewhere classified: Secondary | ICD-10-CM | POA: Insufficient documentation

## 2021-08-11 NOTE — Progress Notes (Addendum)
[name] ([id number]) , Visit Report for 08/11/2021 Chief Complaint Document Details Patient Name: Date of Service: [name], [name] 08/11/2021 3:15 PM Medical Record Number: [id number] Patient Account Number: [id number] Date of Birth/Sex: Treating RN: 1955-02-23 (66 y.o. [name], [name] Primary Care Provider: [name] Other Clinician: Referring Provider: Treating Provider/Extender: [name] in Treatment: 1 Information Obtained from: Patient Chief Complaint Patients presents for treatment of an open diabetic ulcer (x2) 08/04/2021; patient returns to clinic with bilateral leg wounds as well as areas on the right foot Electronic Signature(s) Signed: 08/11/2021 3:33:47 PM By: [name] PA-C Entered By: [name] on 08/11/2021 15:33:47 -------------------------------------------------------------------------------- HPI Details Patient Name: Date of Service: [name], [name] [name]. 08/11/2021 3:15 PM Medical Record Number: [id number] Patient Account Number: [id number] Date of Birth/Sex: Treating RN: May 31, 1955 (66 y.o. [name], [name] Primary Care Provider: [name] Other Clinician: Referring Provider: Treating Provider/Extender: [name] in Treatment: 1 History of Present Illness HPI Description: ADMISSION 03/22/2021 This is a 66 year old man with a past medical history significant for diabetes type 2, congestive heart failure, peripheral arterial disease, morbid obesity, venous insufficiency, and coronary artery disease. He has been followed by [name] in podiatry, who performed a transmetatarsal amputation on the left foot in August 2022. He had issues healing that wound, but based upon Dr. [name] notes, ultimately the TMA wound healed. During his recovery from that surgery, however, ulcers opened up over the DIP joint of the right second and third toe. These have apparently closed and reopened multiple  times. It sounds like one of the issues has been moisture accumulation and maceration of the tissues causing them to reopen. At his last visit with [name], on March 01, 2021, there continues to be problems with moisture and he was referred to wound care for further evaluation and management. He had a formal aortogram with runoff performed prior to his TMA. The findings are copied here: Patient has inline flow to both feet with no significant flow-limiting lesion that would be amenable to percutaneous or open revascularization. He does have an element of small vessel disease and has a short segment occlusion of the distal anterior tibial/dorsalis pedis artery on the left foot but does have posterior tibial artery flow. Would recommend management of wounds with amputation of toes 2 and 3 on the right foot if the wounds do not heal and deteriorate. Transmetatarsal amputation on the left side has as good a blood supply as it is going to get and hopefully this will heal in the future. Formal ABIs were done in January 2023. They are normal bilaterally. ABI Findings: +---------+------------------+-----+----------+--------+ Right Rt Pressure (mmHg)IndexWaveform Comment  +---------+------------------+-----+----------+--------+ Brachial 160     +---------+------------------+-----+----------+--------+ PTA 192 1.06 monophasic  +---------+------------------+-----+----------+--------+ DP 159 0.88 monophasic  +---------+------------------+-----+----------+--------+ Great T oe145 0.80    +---------+------------------+-----+----------+--------+ +--------+------------------+-----+---------+-------+ Left Lt Pressure (mmHg)IndexWaveform Comment +--------+------------------+-----+---------+-------+ [id number]     +--------+------------------+-----+---------+-------+ PTA 204 1.13 triphasic  +--------+------------------+-----+---------+-------+ DP 194  1.07 biphasic   +--------+------------------+-----+---------+-------+ +-------+-----------+-----------+------------+------------+ ABI/TBIT oday's ABIT oday's TBIPrevious ABIPrevious TBI +-------+-----------+-----------+------------+------------+ Right 1.06 0.8 1.26 0.65  +-------+-----------+-----------+------------+------------+ Left 1.13 amputation 1.15 amputation  +-------+-----------+-----------+------------+------------+ Previous ABI on 08/06/20 at [facility] Pedal pressures falsely elevated due to medial calcification. Summary: Right: Resting right ankle-brachial index is within normal range. The right toe-brachial index is normal. Left: Resting left ankle-brachial index is within normal range. READMISSION 08/04/2021 Mr. Saetern is now a 66 year old man who I remember from this clinic many  years ago I think he had a right lower extremity predominantly venous wound at the time. He was here for 1 visit in March of this year had wounds on his right second and third toes we apparently dressed them many and they healed so he did not come back. He is listed in [place] is being a diabetic although the patient denies this says he is verified it with his primary doctor. In any case over the last several weeks or so according the patient although these wounds look somewhat more chronic than that he has developed predominantly large wounds on the right medial and right lateral ankle smaller areas on the left leg and areas on the dorsal aspect of his right second and third toes. Its not clear how he has been dressing these. 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 The patient has known PAD. He had an angiogram in August 2022 at which time he had nonhealing wounds in both feet. On the left his major vessels in the thigh were all patent. He had three-vessel patent to the level of the ankle. He had a  very short occlusion in the left anterior tibial. On the right lower extremity the major vessels in his thigh were all patent. He had three-vessel runoff to the foot sluggish filling of the anterior tibial artery. He was felt to have some component of small vessel disease but nothing that was amenable or needed revascularization. It was recommended that he have amputation of the second and third toes on the right foot if they did not heal He has been following with [name] of podiatry. [name] got him juxta lite stockings although I do not think he had them on properly he has uncontrolled edema in both legs Past medical history includes type 2 diabetes [although the patient really denies this], left TMA in 2022,lower extremity wounds in fact attendance at this clinic in 2009-2010, A-fib on Eliquis, chronic venous insufficiency with secondary lymphedema history of non-Hodgkin's lymphoma. 08-11-2021 upon evaluation today patient presents for follow-up evaluation he was seen last Wednesday initially for inspection here in our clinic. With that being said he tells me that he unfortunately has been having a lot of drainage and is actually coming through his wrap. Fortunately I do not see any evidence of active infection locally or systemically at this time which is great news. No fevers, chills, nausea, vomiting, or diarrhea. With that being said there does appear to be some evidence of local infection based on what I am seeing today. Electronic Signature(s) Signed: 08/11/2021 4:52:45 PM By: [name] PA-C Entered By: [name] on 08/11/2021 16:52:44 -------------------------------------------------------------------------------- Physical Exam Details Patient Name: Date of Service: [name], [name] [name]. 08/11/2021 3:15 PM Medical Record Number: [id number] Patient Account Number: [id number] Date of Birth/Sex: Treating RN: 01/07/1956 (66 y.o. [name] Noe Primary Care Provider: [name] Other Clinician: Referring Provider: Treating Provider/Extender: [name] in Treatment: 1 Constitutional Chronically ill appearing but in no apparent acute distress. Respiratory normal breathing without difficulty. Psychiatric this patient is able to make decisions and demonstrates good insight into disease process. Alert and Oriented x 3. pleasant and cooperative. Notes Upon inspection patient's wound bed actually showed signs of good granulation and epithelization at this point. Fortunately I do not see any systemic infection here the locally I do believe there is some issues around the  right lower extremity especially on the anterior to medial side. With that being said I believe Levaquin may be a good option for [place] to start with. The patient is in agreement with that plan. Electronic Signature(s) Signed: 08/11/2021 4:53:29 PM By: [name] PA-C Entered By: [name] on 08/11/2021 16:53:29 -------------------------------------------------------------------------------- Physician Orders Details Patient Name: Date of Service: [name], [name] [name]. 08/11/2021 3:15 PM Medical Record Number: [id number] Patient Account Number: [id number] Date of Birth/Sex: Treating RN: Sep 03, 1955 (66 y.o. [name], [name] Primary Care Provider: [name] Other Clinician: Referring Provider: Treating Provider/Extender: [name] in Treatment: 1 Verbal / Phone Orders: No Diagnosis Coding ICD-10 Coding Code Description I89.0 Lymphedema, not elsewhere classified I87.333 Chronic venous hypertension (idiopathic) with ulcer and inflammation of bilateral lower extremity L97.828 Non-pressure chronic ulcer of other part of left lower leg with other specified severity L97.818 Non-pressure chronic ulcer of other part of right lower leg with other specified severity E11.621 Type 2 diabetes mellitus with foot ulcer L97.518 Non-pressure chronic  ulcer of other part of right foot with other specified severity Follow-up Appointments ppointment in 1 week. - Wednesday Toney Rakes, Room 9 315pm 08/18/2021 Return A Nurse Visit: - Monday 245pm 08/16/2021 Leveda Anna, Room 7) Other: - ***Pick up oral antibiotics at your pharmacy now and start today.**** [facility] wound care supplies for your toes. Bathing/ Shower/ Hygiene May shower with protection but do not get wound dressing(s) wet. Edema Control - Lymphedema / SCD / Other Elevate legs to the level of the heart or above for 30 minutes daily and/or when sitting, a frequency of: - 3-4 times a day throughout the day. Avoid standing for long periods of time. Exercise regularly Wound Treatment Wound #10 - T Third oe Wound Laterality: Right Cleanser: Wound Cleanser Every Other Day/30 Days Discharge Instructions: Cleanse the wound with wound cleanser prior to applying a clean dressing using gauze sponges, not tissue or cotton balls. Prim Dressing: KerraCel Ag Gelling Fiber Dressing, 2x2 in (silver alginate) Every Other Day/30 Days ary Discharge Instructions: Apply silver alginate to wound bed as instructed Secondary Dressing: Woven Gauze Sponges 2x2 in Every Other Day/30 Days Discharge Instructions: Apply over primary dressing as directed. Secured With: 78M Medipore Public affairs consultant Surgical T 2x10 (in/yd) Every Other Day/30 Days ape Discharge Instructions: Secure with tape as directed. Wound #11 - Lower Leg Wound Laterality: Left, Medial Peri-Wound Care: Zinc Oxide Ointment 30g tube 1 x Per Week/30 Days Discharge Instructions: Apply Zinc Oxide to periwound with each dressing change Peri-Wound Care: Sween Lotion (Moisturizing lotion) 1 x Per Week/30 Days Discharge Instructions: Apply moisturizing lotion as directed Prim Dressing: IODOFLEX 0.9% Cadexomer Iodine Pad 4x6 cm 1 x Per Week/30 Days ary Discharge Instructions: Apply to wound bed as instructed Secondary Dressing: ABD Pad, 8x10 1 x Per Week/30  Days Discharge Instructions: Apply over primary dressing as directed. Secondary Dressing: CarboFLEX Odor Control Dressing, 4x4 in 1 x Per Week/30 Days Discharge Instructions: Apply over primary dressing as directed. Secondary Dressing: Zetuvit Plus 4x8 in 1 x Per Week/30 Days Discharge Instructions: Apply over primary dressing as directed. Compression Wrap: ThreePress (3 layer compression wrap) 1 x Per Week/30 Days Discharge Instructions: Apply three layer compression as directed. Wound #12 - Lower Leg Wound Laterality: Left, Anterior Peri-Wound Care: Zinc Oxide Ointment 30g tube 1 x Per Week/30 Days Discharge Instructions: Apply Zinc Oxide to periwound with each dressing change Peri-Wound Care: Sween Lotion (Moisturizing lotion) 1 x Per Week/30 Days Discharge Instructions:  Apply moisturizing lotion as directed Prim Dressing: IODOFLEX 0.9% Cadexomer Iodine Pad 4x6 cm 1 x Per Week/30 Days ary Discharge Instructions: Apply to wound bed as instructed Secondary Dressing: ABD Pad, 8x10 1 x Per Week/30 Days Discharge Instructions: Apply over primary dressing as directed. Secondary Dressing: CarboFLEX Odor Control Dressing, 4x4 in 1 x Per Week/30 Days Discharge Instructions: Apply over primary dressing as directed. Secondary Dressing: Zetuvit Plus 4x8 in 1 x Per Week/30 Days Discharge Instructions: Apply over primary dressing as directed. Compression Wrap: ThreePress (3 layer compression wrap) 1 x Per Week/30 Days Discharge Instructions: Apply three layer compression as directed. Wound #7 - Malleolus Wound Laterality: Right, Medial Peri-Wound Care: Zinc Oxide Ointment 30g tube 1 x Per Week/30 Days Discharge Instructions: Apply Zinc Oxide to periwound with each dressing change Peri-Wound Care: Sween Lotion (Moisturizing lotion) 1 x Per Week/30 Days Discharge Instructions: Apply moisturizing lotion as directed Prim Dressing: IODOFLEX 0.9% Cadexomer Iodine Pad 4x6 cm 1 x Per Week/30  Days ary Discharge Instructions: Apply to wound bed as instructed Secondary Dressing: ABD Pad, 8x10 1 x Per Week/30 Days Discharge Instructions: Apply over primary dressing as directed. Secondary Dressing: CarboFLEX Odor Control Dressing, 4x4 in 1 x Per Week/30 Days Discharge Instructions: Apply over primary dressing as directed. Secondary Dressing: Zetuvit Plus 4x8 in 1 x Per Week/30 Days Discharge Instructions: Apply over primary dressing as directed. Compression Wrap: ThreePress (3 layer compression wrap) 1 x Per Week/30 Days Discharge Instructions: Apply three layer compression as directed. Wound #8 - Malleolus Wound Laterality: Right, Lateral Peri-Wound Care: Zinc Oxide Ointment 30g tube 1 x Per Week/30 Days Discharge Instructions: Apply Zinc Oxide to periwound with each dressing change Peri-Wound Care: Sween Lotion (Moisturizing lotion) 1 x Per Week/30 Days Discharge Instructions: Apply moisturizing lotion as directed Prim Dressing: IODOFLEX 0.9% Cadexomer Iodine Pad 4x6 cm 1 x Per Week/30 Days ary Discharge Instructions: Apply to wound bed as instructed Secondary Dressing: ABD Pad, 8x10 1 x Per Week/30 Days Discharge Instructions: Apply over primary dressing as directed. Secondary Dressing: CarboFLEX Odor Control Dressing, 4x4 in 1 x Per Week/30 Days Discharge Instructions: Apply over primary dressing as directed. Secondary Dressing: Zetuvit Plus 4x8 in 1 x Per Week/30 Days Discharge Instructions: Apply over primary dressing as directed. Compression Wrap: ThreePress (3 layer compression wrap) 1 x Per Week/30 Days Discharge Instructions: Apply three layer compression as directed. Wound #9 - T Second oe Wound Laterality: Right Cleanser: Wound Cleanser Every Other Day/30 Days Discharge Instructions: Cleanse the wound with wound cleanser prior to applying a clean dressing using gauze sponges, not tissue or cotton balls. Prim Dressing: KerraCel Ag Gelling Fiber Dressing, 2x2 in  (silver alginate) Every Other Day/30 Days ary Discharge Instructions: Apply silver alginate to wound bed as instructed Secondary Dressing: Woven Gauze Sponges 2x2 in Every Other Day/30 Days Discharge Instructions: Apply over primary dressing as directed. Secured With: 35M Medipore Public affairs consultant Surgical T 2x10 (in/yd) Every Other Day/30 Days ape Discharge Instructions: Secure with tape as directed. Laboratory erobe culture (MICRO) - right medial lower leg PCR culture - (ICD10 L97.818 - Non-pressure Bacteria identified in Unspecified specimen by A chronic ulcer of other part of right lower leg with other specified severity) LOINC Code: 892-1 Convenience Name: Aerobic culture-specimen not specified Patient Medications llergies: latex, ACE Inhibitors, penicillin A Notifications Medication Indication Start End 08/11/2021 levofloxacin DOSE 1 - oral 750 mg tablet - 1 tablet oral taken 1 time per day for 14 days Electronic Signature(s) Signed: 08/11/2021 4:54:42 PM By: Joaquim Lai  III, Milan Perkins PA-C Entered By: [name] on 08/11/2021 16:54:41 -------------------------------------------------------------------------------- Problem List Details Patient Name: Date of Service: [name], [name] 08/11/2021 3:15 PM Medical Record Number: [id number] Patient Account Number: [id number] Date of Birth/Sex: Treating RN: Oct 10, 1955 (66 y.o. [name], [name] Primary Care Provider: [name] Other Clinician: Referring Provider: Treating Provider/Extender: [name] in Treatment: 1 Active Problems ICD-10 Encounter Code Description Active Date MDM Diagnosis I89.0 Lymphedema, not elsewhere classified 08/04/2021 No Yes I87.333 Chronic venous hypertension (idiopathic) with ulcer and inflammation of 08/04/2021 No Yes bilateral lower extremity L97.828 Non-pressure chronic ulcer of other part of left lower leg with other specified 08/04/2021 No Yes severity L97.818 Non-pressure  chronic ulcer of other part of right lower leg with other specified 08/04/2021 No Yes severity E11.621 Type 2 diabetes mellitus with foot ulcer 08/04/2021 No Yes L97.518 Non-pressure chronic ulcer of other part of right foot with other specified 08/04/2021 No Yes severity Inactive Problems Resolved Problems Electronic Signature(s) Signed: 08/11/2021 3:33:38 PM By: [name] PA-C Entered By: [name] on 08/11/2021 15:33:38 -------------------------------------------------------------------------------- Progress Note Details Patient Name: Date of Service: [name], [name] [name] 08/11/2021 3:15 PM Medical Record Number: [id number] Patient Account Number: [id number] Date of Birth/Sex: Treating RN: [birth year] (66 y.o. [name], [name] Primary Care Provider: [name] Other Clinician: Referring Provider: Treating Provider/Extender: [name] in Treatment: 1 Subjective Chief Complaint Information obtained from Patient Patients presents for treatment of an open diabetic ulcer (x2) 08/04/2021; patient returns to clinic with bilateral leg wounds as well as areas on the right foot History of Present Illness (HPI) ADMISSION 03/22/2021 This is a 66 year old man with a past medical history significant for diabetes type 2, congestive heart failure, peripheral arterial disease, morbid obesity, venous insufficiency, and coronary artery disease. He has been followed by [name] in podiatry, who performed a transmetatarsal amputation on the left foot in August 2022. He had issues healing that wound, but based upon Dr. [name] notes, ultimately the TMA wound healed. During his recovery from that surgery, however, ulcers opened up over the DIP joint of the right second and third toe. These have apparently closed and reopened multiple times. It sounds like one of the issues has been moisture accumulation and maceration of the tissues causing them to reopen. At his last  visit with [name], on March 01, 2021, there continues to be problems with moisture and he was referred to wound care for further evaluation and management. He had a formal aortogram with runoff performed prior to his TMA. The findings are copied here: Patient has inline flow to both feet with no significant flow-limiting lesion that would be amenable to percutaneous or open revascularization. He does have an element of small vessel disease and has a short segment occlusion of the distal anterior tibial/dorsalis pedis artery on the left foot but does have posterior tibial artery flow. Would recommend management of wounds with amputation of toes 2 and 3 on the right foot if the wounds do not heal and deteriorate. Transmetatarsal amputation on the left side has as good a blood supply as it is going to get and hopefully this will heal in the future. Formal ABIs were done in January 2023. They are normal bilaterally. ABI Findings: +---------+------------------+-----+----------+--------+ Right Rt Pressure (mmHg)IndexWaveform Comment  +---------+------------------+-----+----------+--------+ Brachial 160    +---------+------------------+-----+----------+--------+ PTA 192 1.06 monophasic  +---------+------------------+-----+----------+--------+ DP 159 0.88 monophasic  +---------+------------------+-----+----------+--------+ Great T oe145 0.80    +---------+------------------+-----+----------+--------+ +--------+------------------+-----+---------+-------+  Left Lt Pressure (mmHg)IndexWaveform Comment +--------+------------------+-----+---------+-------+ [id number]    +--------+------------------+-----+---------+-------+ PTA 204 1.13 triphasic  +--------+------------------+-----+---------+-------+ DP 194 1.07 biphasic    +--------+------------------+-----+---------+-------+ +-------+-----------+-----------+------------+------------+ ABI/TBIT oday's ABIT oday's TBIPrevious ABIPrevious TBI +-------+-----------+-----------+------------+------------+ Right 1.06 0.8 1.26 0.65  +-------+-----------+-----------+------------+------------+ Left 1.13 amputation 1.15 amputation  +-------+-----------+-----------+------------+------------+ Previous ABI on 08/06/20 at [facility] Pedal pressures falsely elevated due to medial calcification. Summary: Right: Resting right ankle-brachial index is within normal range. The right toe-brachial index is normal. Left: Resting left ankle-brachial index is within normal range. READMISSION 08/04/2021 Mr. Burright is now a 66 year old man who I remember from this clinic many years ago I think he had a right lower extremity predominantly venous wound at the time. He was here for 1 visit in March of this year had wounds on his right second and third toes we apparently dressed them many and they healed so he did not come back. He is listed in [place] is being a diabetic although the patient denies this says he is verified it with his primary doctor. In any case over the last several weeks or so according the patient although these wounds look somewhat more chronic than that he has developed predominantly large wounds on the right medial and right lateral ankle smaller areas on the left leg and areas on the dorsal aspect of his right second and third toes. Its not clear how he has been dressing these. 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 The patient has known PAD. He had an angiogram in August 2022 at which time he had nonhealing wounds in both feet. On the left his major vessels in the thigh were all patent. He had three-vessel patent to the level of the ankle. He had a very short  occlusion in the left anterior tibial. On the right lower extremity the major vessels in his thigh were all patent. He had three-vessel runoff to the foot sluggish filling of the anterior tibial artery. He was felt to have some component of small vessel disease but nothing that was amenable or needed revascularization. It was recommended that he have amputation of the second and third toes on the right foot if they did not heal He has been following with [name] of podiatry. [name] got him juxta lite stockings although I do not think he had them on properly he has uncontrolled edema in both legs Past medical history includes type 2 diabetes [although the patient really denies this], left TMA in 2022,lower extremity wounds in fact attendance at this clinic in 2009-2010, A-fib on Eliquis, chronic venous insufficiency with secondary lymphedema history of non-Hodgkin's lymphoma. 08-11-2021 upon evaluation today patient presents for follow-up evaluation he was seen last Wednesday initially for inspection here in our clinic. With that being said he tells me that he unfortunately has been having a lot of drainage and is actually coming through his wrap. Fortunately I do not see any evidence of active infection locally or systemically at this time which is great news. No fevers, chills, nausea, vomiting, or diarrhea. With that being said there does appear to be some evidence of local infection based on what I am seeing today. Objective Constitutional Chronically ill appearing but in no apparent acute distress. Vitals Time Taken: 4:07 PM, Height: 71 in, Weight: 350 lbs, BMI: 48.8, Temperature: 98.5 F, Pulse: 85 bpm, Respiratory Rate: 20 breaths/min, Blood Pressure: 123/78 mmHg, Capillary Blood Glucose: 113 mg/dl. Respiratory normal breathing  without difficulty. Psychiatric this patient is able to make decisions and demonstrates good insight into disease process. Alert and Oriented x 3. pleasant and  cooperative. General Notes: Upon inspection patient's wound bed actually showed signs of good granulation and epithelization at this point. Fortunately I do not see any systemic infection here the locally I do believe there is some issues around the right lower extremity especially on the anterior to medial side. With that being said I believe Levaquin may be a good option for [place] to start with. The patient is in agreement with that plan. Integumentary (Hair, Skin) Wound #10 status is Open. Original cause of wound was Gradually Appeared. The date acquired was: 07/16/2021. The wound has been in treatment 1 weeks. The wound is located on the Right T Third. The wound measures 0.3cm length x 0.2cm width x 0.1cm depth; 0.047cm^2 area and 0.005cm^3 volume. There is Fat oe Layer (Subcutaneous Tissue) exposed. There is no tunneling or undermining noted. There is a medium amount of serosanguineous drainage noted. The wound margin is distinct with the outline attached to the wound base. There is medium (34-66%) red granulation within the wound bed. There is a medium (34-66%) amount of necrotic tissue within the wound bed including Adherent Slough. Wound #11 status is Open. Original cause of wound was Gradually Appeared. The date acquired was: 07/22/2021. The wound has been in treatment 1 weeks. The wound is located on the Left,Medial Lower Leg. The wound measures 2.7cm length x 1.6cm width x 0.1cm depth; 3.393cm^2 area and 0.339cm^3 volume. There is Fat Layer (Subcutaneous Tissue) exposed. There is no tunneling or undermining noted. There is a large amount of serosanguineous drainage noted. Foul odor after cleansing was noted. The wound margin is distinct with the outline attached to the wound base. There is medium (34-66%) red, pink granulation within the wound bed. There is a medium (34-66%) amount of necrotic tissue within the wound bed including Adherent Slough. Wound #12 status is Open. Original cause of  wound was Gradually Appeared. The date acquired was: 07/15/2021. The wound has been in treatment 1 weeks. The wound is located on the Left,Anterior Lower Leg. The wound measures 4cm length x 5.6cm width x 0.1cm depth; 17.593cm^2 area and 1.759cm^3 volume. There is Fat Layer (Subcutaneous Tissue) exposed. There is no tunneling or undermining noted. There is a large amount of serosanguineous drainage noted. Foul odor after cleansing was noted. The wound margin is distinct with the outline attached to the wound base. There is small (1-33%) red granulation within the wound bed. There is a large (67-100%) amount of necrotic tissue within the wound bed including Adherent Slough. Wound #7 status is Open. Original cause of wound was Blister. The date acquired was: 07/21/2021. The wound has been in treatment 1 weeks. The wound is located on the Right,Medial Malleolus. The wound measures 5.5cm length x 8.5cm width x 0.3cm depth; 36.717cm^2 area and 11.015cm^3 volume. There is Fat Layer (Subcutaneous Tissue) exposed. There is no tunneling or undermining noted. There is a large amount of serosanguineous drainage noted. Foul odor after cleansing was noted. The wound margin is distinct with the outline attached to the wound base. There is small (1-33%) pink granulation within the wound bed. There is a large (67-100%) amount of necrotic tissue within the wound bed including Eschar and Adherent Slough. Wound #8 status is Open. Original cause of wound was Gradually Appeared. The date acquired was: 07/22/2021. The wound has been in treatment 1 weeks. The wound is located  on the Right,Lateral Malleolus. The wound measures 3cm length x 4cm width x 0.1cm depth; 9.425cm^2 area and 0.942cm^3 volume. There is Fat Layer (Subcutaneous Tissue) exposed. There is no tunneling or undermining noted. There is a large amount of serosanguineous drainage noted. Foul odor after cleansing was noted. The wound margin is distinct with the  outline attached to the wound base. There is small (1-33%) red, pink granulation within the wound bed. There is a large (67-100%) amount of necrotic tissue within the wound bed including Adherent Slough. Wound #9 status is Open. Original cause of wound was Gradually Appeared. The date acquired was: 07/15/2021. The wound has been in treatment 1 weeks. The wound is located on the Right T Second. The wound measures 0.6cm length x 1.1cm width x 0.1cm depth; 0.518cm^2 area and 0.052cm^3 volume. There is oe Fat Layer (Subcutaneous Tissue) exposed. There is no tunneling or undermining noted. There is a medium amount of serosanguineous drainage noted. Foul odor after cleansing was noted. The wound margin is distinct with the outline attached to the wound base. There is large (67-100%) red granulation within the wound bed. There is a small (1-33%) amount of necrotic tissue within the wound bed including Adherent Slough. 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 There was a Three Layer Compression Therapy Procedure by Deon Pilling, RN. Post procedure Diagnosis Wound #11: Same as Pre-Procedure Wound #12 Pre-procedure diagnosis of Wound #12 is a Venous Leg Ulcer located on the Left,Anterior Lower Leg . There was a Three Layer Compression Therapy Procedure by Deon Pilling, RN. Post procedure Diagnosis Wound #12: Same as Pre-Procedure Wound #7 Pre-procedure  diagnosis of Wound #7 is a Venous Leg Ulcer located on the Right,Medial Malleolus . There was a Three Layer Compression Therapy Procedure by Deon Pilling, RN. Post procedure Diagnosis Wound #7: Same as Pre-Procedure Wound #8 Pre-procedure diagnosis of Wound #8 is a Venous Leg Ulcer located on the Right,Lateral Malleolus . There was a Three Layer Compression Therapy Procedure by Deon Pilling, RN. Post procedure Diagnosis Wound #8: Same as Pre-Procedure Plan Follow-up Appointments: Return Appointment in 1 week. - Wednesday Toney Rakes, Room 9 315pm 08/18/2021 Nurse Visit: - Monday 245pm 08/16/2021 Leveda Anna, Room 7) Other: - ***Pick up oral antibiotics at your pharmacy now and start today.**** [facility] wound care supplies for your toes. Bathing/ Shower/ Hygiene: May shower with protection but do not get wound dressing(s) wet. Edema Control - Lymphedema / SCD / Other: Elevate legs to the level of the heart or above for 30 minutes daily and/or when sitting, a frequency of: - 3-4 times a day throughout the day. Avoid standing for long periods of time. Exercise regularly Laboratory ordered were: Aerobic culture-specimen not specified - right medial lower leg PCR culture The following medication(s) was prescribed: levofloxacin oral 750 mg tablet 1 1 tablet oral taken 1 time per day for 14 days starting 08/11/2021 WOUND #10: - T Third Wound Laterality: Right oe Cleanser: Wound Cleanser Every Other Day/30 Days Discharge Instructions: Cleanse the wound with wound cleanser  prior to applying a clean dressing using gauze sponges, not tissue or cotton balls. Prim Dressing: KerraCel Ag Gelling Fiber Dressing, 2x2 in (silver alginate) Every Other Day/30 Days ary Discharge Instructions: Apply silver alginate to wound bed as instructed Secondary Dressing: Woven Gauze Sponges 2x2 in Every Other Day/30 Days Discharge Instructions: Apply over primary dressing as directed. Secured With: 54M Medipore Public affairs consultant  Surgical T 2x10 (in/yd) Every Other Day/30 Days ape Discharge Instructions: Secure with tape as directed. WOUND #11: - Lower Leg Wound Laterality: Left, Medial Peri-Wound Care: Zinc Oxide Ointment 30g tube 1 x Per Week/30 Days Discharge Instructions: Apply Zinc Oxide to periwound with each dressing change Peri-Wound Care: Sween Lotion (Moisturizing lotion) 1 x Per Week/30 Days Discharge Instructions: Apply moisturizing lotion as directed Prim Dressing: IODOFLEX 0.9% Cadexomer Iodine Pad 4x6 cm 1 x Per Week/30 Days ary Discharge Instructions: Apply to wound bed as instructed Secondary Dressing: ABD Pad, 8x10 1 x Per Week/30 Days Discharge Instructions: Apply over primary dressing as directed. Secondary Dressing: CarboFLEX Odor Control Dressing, 4x4 in 1 x Per Week/30 Days Discharge Instructions: Apply over primary dressing as directed. Secondary Dressing: Zetuvit Plus 4x8 in 1 x Per Week/30 Days Discharge Instructions: Apply over primary dressing as directed. Com pression Wrap: ThreePress (3 layer compression wrap) 1 x Per Week/30 Days Discharge Instructions: Apply three layer compression as directed. WOUND #12: - Lower Leg Wound Laterality: Left, Anterior Peri-Wound Care: Zinc Oxide Ointment 30g tube 1 x Per Week/30 Days Discharge Instructions: Apply Zinc Oxide to periwound with each dressing change Peri-Wound Care: Sween Lotion (Moisturizing lotion) 1 x Per Week/30 Days Discharge Instructions: Apply moisturizing lotion as directed Prim Dressing: IODOFLEX 0.9% Cadexomer Iodine Pad 4x6 cm 1 x Per Week/30 Days ary Discharge Instructions: Apply to wound bed as instructed Secondary Dressing: ABD Pad, 8x10 1 x Per Week/30 Days Discharge Instructions: Apply over primary dressing as directed. Secondary Dressing: CarboFLEX Odor Control Dressing, 4x4 in 1 x Per Week/30 Days Discharge Instructions: Apply over primary dressing as directed. Secondary Dressing: Zetuvit Plus 4x8 in 1 x Per Week/30  Days Discharge Instructions: Apply over primary dressing as directed. Com pression Wrap: ThreePress (3 layer compression wrap) 1 x Per Week/30 Days Discharge Instructions: Apply three layer compression as directed. WOUND #7: - Malleolus Wound Laterality: Right, Medial Peri-Wound Care: Zinc Oxide Ointment 30g tube 1 x Per Week/30 Days Discharge Instructions: Apply Zinc Oxide to periwound with each dressing change Peri-Wound Care: Sween Lotion (Moisturizing lotion) 1 x Per Week/30 Days Discharge Instructions: Apply moisturizing lotion as directed Prim Dressing: IODOFLEX 0.9% Cadexomer Iodine Pad 4x6 cm 1 x Per Week/30 Days ary Discharge Instructions: Apply to wound bed as instructed Secondary Dressing: ABD Pad, 8x10 1 x Per Week/30 Days Discharge Instructions: Apply over primary dressing as directed. Secondary Dressing: CarboFLEX Odor Control Dressing, 4x4 in 1 x Per Week/30 Days Discharge Instructions: Apply over primary dressing as directed. Secondary Dressing: Zetuvit Plus 4x8 in 1 x Per Week/30 Days Discharge Instructions: Apply over primary dressing as directed. Com pression Wrap: ThreePress (3 layer compression wrap) 1 x Per Week/30 Days Discharge Instructions: Apply three layer compression as directed. WOUND #8: - Malleolus Wound Laterality: Right, Lateral Peri-Wound Care: Zinc Oxide Ointment 30g tube 1 x Per Week/30 Days Discharge Instructions: Apply Zinc Oxide to periwound with each dressing change Peri-Wound Care: Sween Lotion (Moisturizing lotion) 1 x Per Week/30 Days Discharge Instructions: Apply moisturizing lotion as directed Prim Dressing: IODOFLEX 0.9% Cadexomer Iodine Pad 4x6 cm 1 x Per Week/30  Days ary Discharge Instructions: Apply to wound bed as instructed Secondary Dressing: ABD Pad, 8x10 1 x Per Week/30 Days Discharge Instructions: Apply over primary dressing as directed. Secondary Dressing: CarboFLEX Odor Control Dressing, 4x4 in 1 x Per Week/30 Days Discharge  Instructions: Apply over primary dressing as directed. Secondary Dressing: Zetuvit Plus 4x8 in 1 x Per Week/30 Days Discharge Instructions: Apply over primary dressing as directed. Com pression Wrap: ThreePress (3 layer compression wrap) 1 x Per Week/30 Days Discharge Instructions: Apply three layer compression as directed. WOUND #9: - T Second Wound Laterality: Right oe Cleanser: Wound Cleanser Every Other Day/30 Days Discharge Instructions: Cleanse the wound with wound cleanser prior to applying a clean dressing using gauze sponges, not tissue or cotton balls. Prim Dressing: KerraCel Ag Gelling Fiber Dressing, 2x2 in (silver alginate) Every Other Day/30 Days ary Discharge Instructions: Apply silver alginate to wound bed as instructed Secondary Dressing: Woven Gauze Sponges 2x2 in Every Other Day/30 Days Discharge Instructions: Apply over primary dressing as directed. Secured With: 11M Medipore Public affairs consultant Surgical T 2x10 (in/yd) Every Other Day/30 Days ape Discharge Instructions: Secure with tape as directed. 1. I would recommend currently that we go ahead and initiate treatment with Levaquin I am going to send this into the pharmacy for him. He has been on this beforeAnd that is good news in respect to the fact that since he has been on this he should be able to tolerate it without any complications. 2. I am also can recommend that we go ahead and do a continuation of the 3 layer compression wraps bilaterally. 3. We will also continue with Iodoflex which I think can help to kill bacteria as well as clean up some of the surface of the wound here. 4. I am also can recommend that we use CarboFlex to help with some motor control and absorptive properties as well. 5. I am also going to go ahead and plan to see the patient back in 1 week's time I want to keep a close eye on this although I do think that doing a nurse visit in between probably on Monday is can be a good option to try to keep this  from becoming overly saturated which would lead to increased issues with infection. 6. I am sending in a PCR culture depend on the results of that make any adjustments in therapy as needed following. We will see patient back for reevaluation in 1 week here in the clinic. If anything worsens or changes patient will contact our office for additional recommendations. Electronic Signature(s) Signed: 08/11/2021 4:56:11 PM By: [name] PA-C Entered By: [name] on 08/11/2021 16:56:10 -------------------------------------------------------------------------------- SuperBill Details Patient Name: Date of Service: [name], [name] [name]. 08/11/2021 Medical Record Number: [id number] Patient Account Number: [id number] Date of Birth/Sex: Treating RN: 05-13-1955 (66 y.o. [name], [name] Primary Care Provider: [name] Other Clinician: Referring Provider: Treating Provider/Extender: [name] in Treatment: 1 Diagnosis Coding ICD-10 Codes Code Description I89.0 Lymphedema, not elsewhere classified I87.333 Chronic venous hypertension (idiopathic) with ulcer and inflammation of bilateral lower extremity L97.828 Non-pressure chronic ulcer of other part of left lower leg with other specified severity L97.818 Non-pressure chronic ulcer of other part of right lower leg with other specified severity E11.621 Type 2 diabetes mellitus with foot ulcer L97.518 Non-pressure chronic ulcer of other part of right foot with other specified severity Facility Procedures CPT4: Code [id number] foot Description: 1 BILATERAL: Application of multi-layer venous compression system;  leg (below knee), including ankle and . Modifier: Quantity: 1 Physician Procedures : CPT4 Code Description Modifier [id number] - WC PHYS LEVEL 4 - EST PT ICD-10 Diagnosis Description I89.0 Lymphedema, not elsewhere classified I87.333 Chronic venous hypertension (idiopathic) with ulcer and inflammation of  bilateral lower extremity  L97.828 Non-pressure chronic ulcer of other part of left lower leg with other specified severity L97.818 Non-pressure chronic ulcer of other part of right lower leg with other specified severity Quantity: 1 Electronic Signature(s) Signed: 08/11/2021 4:56:46 PM By: [name] PA-C Previous Signature: 08/11/2021 4:56:30 PM Version By: [name] PA-C Entered By: [name] on 08/11/2021 16:56:45

## 2021-08-12 NOTE — Progress Notes (Signed)
[name] ([id number]) , Visit Report for 08/11/2021 Arrival Information Details Patient Name: Date of Service: [name], [name] 08/11/2021 3:15 PM Medical Record Number: [id number] Patient Account Number: [id number] Date of Birth/Sex: Treating RN: October 30, 1955 (66 y.o. [name], [name] Primary Care Marabelle Cushman: [name] Other Clinician: Referring Dayvion Sans: Treating Analysa Nutting/Extender: [name] in Treatment: 1 Visit Information History Since Last Visit Added or deleted any medications: No Patient Arrived: Wheel Chair Any new allergies or adverse reactions: No Arrival Time: 16:07 Had a fall or experienced change in No Accompanied By: wife activities of daily living that may affect Transfer Assistance: Manual risk of falls: Patient Identification Verified: Yes Signs or symptoms of abuse/neglect since last visito No Secondary Verification Process Completed: Yes Hospitalized since last visit: No Patient Requires Transmission-Based Precautions: No Implantable device outside of the clinic excluding No Patient Has Alerts: Yes cellular tissue based products placed in the center Patient Alerts: Patient on Blood Thinner since last visit: 01/2021 ABI L 1.13 R 1.06 Has Dressing in Place as Prescribed: Yes 01/2021 TBI L amp R 0.8 Pain Present Now: Yes Electronic Signature(s) Signed: 08/11/2021 4:10:56 PM By: Sandre Kitty Entered By: Sandre Kitty on 08/11/2021 16:07:40 -------------------------------------------------------------------------------- Compression Therapy Details Patient Name: Date of Service: [name] Gloss D. 08/11/2021 3:15 PM Medical Record Number: [id number] Patient Account Number: [id number] Date of Birth/Sex: Treating RN: 20-Jan-1955 (66 y.o. [name] Primary Care Shinika Estelle: [name] Other Clinician: Referring Grey Schlauch: Treating Ericberto Padget/Extender: [name] in Treatment: 1 Compression Therapy  Performed for Wound Assessment: Wound #12 Left,Anterior Lower Leg Performed By: Clinician Deon Pilling, RN Compression Type: Three Layer Post Procedure Diagnosis Same as Pre-procedure Electronic Signature(s) Signed: 08/11/2021 6:08:09 PM By: Deon Pilling RN, BSN Entered By: Deon Pilling on 08/11/2021 16:48:07 -------------------------------------------------------------------------------- Compression Therapy Details Patient Name: Date of Service: [name], [name] [name] 08/11/2021 3:15 PM Medical Record Number: [id number] Patient Account Number: [id number] Date of Birth/Sex: Treating RN: [birth year] (66 y.o. [name] Primary Care Lateshia Schmoker: [name] Other Clinician: Referring Earlie Arciga: Treating Ednamae Schiano/Extender: [name] in Treatment: 1 Compression Therapy Performed for Wound Assessment: Wound #11 Left,Medial Lower Leg Performed By: Clinician Deon Pilling, RN Compression Type: Three Layer Post Procedure Diagnosis Same as Pre-procedure Electronic Signature(s) Signed: 08/11/2021 6:08:09 PM By: Deon Pilling RN, BSN Entered By: Deon Pilling on 08/11/2021 16:48:07 -------------------------------------------------------------------------------- Compression Therapy Details Patient Name: Date of Service: [name], [name] [name] 08/11/2021 3:15 PM Medical Record Number: [id number] Patient Account Number: [id number] Date of Birth/Sex: Treating RN: 1955/10/29 (66 y.o. [name] Primary Care Tezra Mahr: [name] Other Clinician: Referring Gwendolin Briel: Treating Lyndell Gillyard/Extender: [name] in Treatment: 1 Compression Therapy Performed for Wound Assessment: Wound #7 Right,Medial Malleolus Performed By: Clinician Deon Pilling, RN Compression Type: Three Layer Post Procedure Diagnosis Same as Pre-procedure Electronic Signature(s) Signed: 08/11/2021 6:08:09 PM By: Deon Pilling RN, BSN Entered By: Deon Pilling on 08/11/2021  16:48:07 -------------------------------------------------------------------------------- Compression Therapy Details Patient Name: Date of Service: [name], [name] [name] 08/11/2021 3:15 PM Medical Record Number: [id number] Patient Account Number: [id number] Date of Birth/Sex: Treating RN: 10-12-1955 (66 y.o. [name] Primary Care Clifford Benninger: [name] Other Clinician: Referring Jeniya Flannigan: Treating Callaghan Laverdure/Extender: [name] in Treatment: 1 Compression Therapy Performed for Wound Assessment: Wound #8 Right,Lateral Malleolus Performed By: Clinician Deon Pilling, RN Compression Type: Three Layer Post Procedure Diagnosis Same as Pre-procedure Electronic Signature(s) Signed: 08/11/2021 6:08:09 PM By: Deon Pilling  RN, BSN Signed: 08/11/2021 6:08:09 PM By: Deon Pilling RN, BSN Entered By: Deon Pilling on 08/11/2021 16:48:07 -------------------------------------------------------------------------------- Encounter Discharge Information Details Patient Name: Date of Service: [name], [name] 08/11/2021 3:15 PM Medical Record Number: [id number] Patient Account Number: [id number] Date of Birth/Sex: Treating RN: 1955/05/26 (66 y.o. [name] Primary Care Mayana Irigoyen: [name] Other Clinician: Referring Trula Frede: Treating Ronaldo Crilly/Extender: [name] in Treatment: 1 Encounter Discharge Information Items Discharge Condition: Stable Ambulatory Status: Wheelchair Discharge Destination: Home Transportation: Private Auto Accompanied By: friend Schedule Follow-up Appointment: Yes Clinical Summary of Care: Electronic Signature(s) Signed: 08/11/2021 6:08:09 PM By: Deon Pilling RN, BSN Entered By: Deon Pilling on 08/11/2021 16:54:31 -------------------------------------------------------------------------------- Lower Extremity Assessment Details Patient Name: Date of Service: [name], [name] [name] 08/11/2021 3:15 PM Medical Record  Number: [id number] Patient Account Number: [id number] Date of Birth/Sex: Treating RN: May 19, 1955 (66 y.o. Janyth Contes Primary Care Tristin Gladman: [name] Other Clinician: Referring Savilla Turbyfill: Treating Levante Simones/Extender: [name] in Treatment: 1 Edema Assessment Assessed: [name]: No] [name]: No] Edema: [Left: Yes] [Right: Yes] Calf Left: Right: Point of Measurement: 31 cm From Medial Instep 51.3 cm 45.7 cm Ankle Left: Right: Point of Measurement: 11 cm From Medial Instep 27.9 cm 28.5 cm Vascular Assessment Pulses: Dorsalis Pedis Palpable: [Left:Yes] [Right:Yes] Electronic Signature(s) Signed: 08/11/2021 5:11:01 PM By: Adline Peals Entered By: Adline Peals on 08/11/2021 16:15:14 -------------------------------------------------------------------------------- Multi-Disciplinary Care Plan Details Patient Name: Date of Service: [name] Gloss D. 08/11/2021 3:15 PM Medical Record Number: [id number] Patient Account Number: [id number] Date of Birth/Sex: Treating RN: [birth year] (66 y.o. Janyth Contes Primary Care Saprina Chuong: [name] Other Clinician: Referring Isamar Wellbrock: Treating Sharita Bienaime/Extender: [name] in Treatment: 1 Multidisciplinary Care Plan reviewed with physician Active Inactive Orientation to the Wound Care Program Nursing Diagnoses: Knowledge deficit related to the wound healing center program Goals: Patient/caregiver will verbalize understanding of the [facility] Date Initiated: 08/04/2021 Target Resolution Date: 08/12/2021 Goal Status: Active Interventions: Provide education on orientation to the wound center Notes: Pain, Acute or Chronic Nursing Diagnoses: Pain, acute or chronic: actual or potential Potential alteration in comfort, pain Goals: Patient will verbalize adequate pain control and receive pain control interventions during procedures as needed Date  Initiated: 08/04/2021 Target Resolution Date: 09/10/2021 Goal Status: Active Patient/caregiver will verbalize comfort level met Date Initiated: 08/04/2021 Target Resolution Date: 09/08/2021 Goal Status: Active Interventions: Encourage patient to take pain medications as prescribed Provide education on pain management Reposition patient for comfort Treatment Activities: Administer pain control measures as ordered : 08/04/2021 Notes: Venous Leg Ulcer Nursing Diagnoses: Knowledge deficit related to disease process and management Goals: Non-invasive venous studies are completed as ordered Date Initiated: 08/04/2021 Target Resolution Date: 08/13/2021 Goal Status: Active Patient/caregiver will verbalize understanding of disease process and disease management Date Initiated: 08/04/2021 Target Resolution Date: 09/16/2021 Goal Status: Active Interventions: Assess peripheral edema status every visit. Compression as ordered Treatment Activities: Non-invasive vascular studies : 08/04/2021 Notes: Wound/Skin Impairment Nursing Diagnoses: Knowledge deficit related to ulceration/compromised skin integrity Goals: Patient/caregiver will verbalize understanding of skin care regimen Date Initiated: 08/04/2021 Target Resolution Date: 08/13/2021 Goal Status: Active Interventions: Assess patient/caregiver ability to perform ulcer/skin care regimen upon admission and as needed Assess ulceration(s) every visit Provide education on ulcer and skin care Treatment Activities: Skin care regimen initiated : 08/04/2021 Topical wound management initiated : 08/04/2021 Notes: Electronic Signature(s) Signed: 08/11/2021 5:11:01 PM By: Adline Peals Entered By: Adline Peals on 08/11/2021 16:09:56 --------------------------------------------------------------------------------  Pain Assessment Details Patient Name: Date of Service: [name], [name] 08/11/2021 3:15 PM Medical Record Number: [id number] Patient  Account Number: [id number] Date of Birth/Sex: Treating RN: 07-Jul-1955 (66 y.o. [name], [name] Primary Care Alannis Hsia: [name] Other Clinician: Referring Braeden Dolinski: Treating Leary Mcnulty/Extender: [name] in Treatment: 1 Active Problems Location of Pain Severity and Description of Pain Patient Has Paino Yes Site Locations Rate the pain. Current Pain Level: 5 Pain Management and Medication Current Pain Management: Electronic Signature(s) Signed: 08/11/2021 4:10:56 PM By: Sandre Kitty Signed: 08/12/2021 3:56:15 PM By: Rhae Hammock RN Entered By: Sandre Kitty on 08/11/2021 16:08:26 -------------------------------------------------------------------------------- Patient/Caregiver Education Details Patient Name: Date of Service: [name] 8/2/2023andnbsp3:15 PM Medical Record Number: [id number] Patient Account Number: [id number] Date of Birth/Gender: Treating RN: 04-06-1955 (66 y.o. Janyth Contes Primary Care Physician: [name] Other Clinician: Referring Physician: Treating Physician/Extender: [name] in Treatment: 1 Education Assessment Education Provided To: Patient Education Topics Provided Wound/Skin Impairment: Methods: Explain/Verbal Responses: Reinforcements needed, State content correctly Electronic Signature(s) Signed: 08/11/2021 5:11:01 PM By: Adline Peals Entered By: Adline Peals on 08/11/2021 16:10:07 -------------------------------------------------------------------------------- Wound Assessment Details Patient Name: Date of Service: [name], [name] [name] 08/11/2021 3:15 PM Medical Record Number: [id number] Patient Account Number: [id number] Date of Birth/Sex: Treating RN: 1955/04/24 (66 y.o. [name], [name] Primary Care Sheva Mcdougle: [name] Other Clinician: Referring Bonham Zingale: Treating Jaquasia Doscher/Extender: [name] in Treatment:  1 Wound Status Wound Number: 10 Primary Diabetic Wound/Ulcer of the Lower Extremity Etiology: Wound Location: Right T Third oe Wound Open Wounding Event: Gradually Appeared Status: Date Acquired: 07/16/2021 Comorbid Sleep Apnea, Arrhythmia, Congestive Heart Failure, Hypertension, Weeks Of Treatment: 1 History: Hypotension, Peripheral Arterial Disease, Peripheral Venous Clustered Wound: No Disease, Type II Diabetes, Gout, Osteoarthritis Photos Wound Measurements Length: (cm) 0.3 Width: (cm) 0.2 Depth: (cm) 0.1 Area: (cm) 0.047 Volume: (cm) 0.005 % Reduction in Area: 82.9% % Reduction in Volume: 81.5% Epithelialization: Small (1-33%) Tunneling: No Undermining: No Wound Description Classification: Grade 2 Wound Margin: Distinct, outline attached Exudate Amount: Medium Exudate Type: Serosanguineous Exudate Color: red, brown Foul Odor After Cleansing: No Slough/Fibrino Yes Wound Bed Granulation Amount: Medium (34-66%) Exposed Structure Granulation Quality: Red Fascia Exposed: No Necrotic Amount: Medium (34-66%) Fat Layer (Subcutaneous Tissue) Exposed: Yes Necrotic Quality: Adherent Slough Tendon Exposed: No Muscle Exposed: No Joint Exposed: No Bone Exposed: No Treatment Notes Wound #10 (Toe Third) Wound Laterality: Right Cleanser Wound Cleanser Discharge Instruction: Cleanse the wound with wound cleanser prior to applying a clean dressing using gauze sponges, not tissue or cotton balls. Peri-Wound Care Topical Primary Dressing KerraCel Ag Gelling Fiber Dressing, 2x2 in (silver alginate) Discharge Instruction: Apply silver alginate to wound bed as instructed Secondary Dressing Woven Gauze Sponges 2x2 in Discharge Instruction: Apply over primary dressing as directed. Secured With [employer] Surgical T 2x10 (in/yd) ape Discharge Instruction: Secure with tape as directed. Compression Wrap Compression Stockings Add-Ons Electronic  Signature(s) Signed: 08/11/2021 5:11:01 PM By: Adline Peals Signed: 08/12/2021 3:56:15 PM By: Rhae Hammock RN Previous Signature: 08/11/2021 4:10:56 PM Version By: Sandre Kitty Entered By: Adline Peals on 08/11/2021 16:11:36 -------------------------------------------------------------------------------- Wound Assessment Details Patient Name: Date of Service: Leath, [name] 08/11/2021 3:15 PM Medical Record Number: [id number] Patient Account Number: [id number] Date of Birth/Sex: Treating RN: 05/26/1955 (66 y.o. [name], [name] Primary Care Ori Kreiter: [name] Other Clinician: Referring Aryah Doering: Treating Shashwat Cleary/Extender: [name] in Treatment: 1 Wound Status  Wound Number: 11 Primary Venous Leg Ulcer Etiology: Wound Location: Left, Medial Lower Leg Secondary Lymphedema Wounding Event: Gradually Appeared Etiology: Date Acquired: 07/22/2021 Wound Open Weeks Of Treatment: 1 Status: Clustered Wound: No Comorbid Sleep Apnea, Arrhythmia, Congestive Heart Failure, History: Hypertension, Hypotension, Peripheral Arterial Disease, Peripheral Venous Disease, Type II Diabetes, Gout, Osteoarthritis Photos Wound Measurements Length: (cm) 2.7 Width: (cm) 1.6 Depth: (cm) 0.1 Area: (cm) 3.393 Volume: (cm) 0.339 % Reduction in Area: 38.3% % Reduction in Volume: 38.4% Epithelialization: Small (1-33%) Tunneling: No Undermining: No Wound Description Classification: Full Thickness Without Exposed Support Structures Wound Margin: Distinct, outline attached Exudate Amount: Large Exudate Type: Serosanguineous Exudate Color: red, brown Foul Odor After Cleansing: Yes Due to Product Use: No Slough/Fibrino Yes Wound Bed Granulation Amount: Medium (34-66%) Exposed Structure Granulation Quality: Red, Pink Fascia Exposed: No Necrotic Amount: Medium (34-66%) Fat Layer (Subcutaneous Tissue) Exposed: Yes Necrotic Quality: Adherent  Slough Tendon Exposed: No Muscle Exposed: No Joint Exposed: No Bone Exposed: No Treatment Notes Wound #11 (Lower Leg) Wound Laterality: Left, Medial Cleanser Peri-Wound Care Zinc Oxide Ointment 30g tube Discharge Instruction: Apply Zinc Oxide to periwound with each dressing change Sween Lotion (Moisturizing lotion) Discharge Instruction: Apply moisturizing lotion as directed Topical Primary Dressing IODOFLEX 0.9% Cadexomer Iodine Pad 4x6 cm Discharge Instruction: Apply to wound bed as instructed Secondary Dressing ABD Pad, 8x10 Discharge Instruction: Apply over primary dressing as directed. CarboFLEX Odor Control Dressing, 4x4 in Discharge Instruction: Apply over primary dressing as directed. Zetuvit Plus 4x8 in Discharge Instruction: Apply over primary dressing as directed. Secured With Compression Wrap ThreePress (3 layer compression wrap) Discharge Instruction: Apply three layer compression as directed. Compression Stockings Add-Ons Electronic Signature(s) Signed: 08/11/2021 5:11:01 PM By: Adline Peals Signed: 08/12/2021 3:56:15 PM By: Rhae Hammock RN Previous Signature: 08/11/2021 4:10:56 PM Version By: Sandre Kitty Entered By: Adline Peals on 08/11/2021 16:12:10 -------------------------------------------------------------------------------- Wound Assessment Details Patient Name: Date of Service: [name], [name] 08/11/2021 3:15 PM Medical Record Number: [id number] Patient Account Number: [id number] Date of Birth/Sex: Treating RN: 01/27/1955 (66 y.o. [name], [name] Primary Care Maurilio Puryear: [name] Other Clinician: Referring Maksym Pfiffner: Treating Arilynn Blakeney/Extender: [name] in Treatment: 1 Wound Status Wound Number: 12 Primary Venous Leg Ulcer Etiology: Wound Location: Left, Anterior Lower Leg Secondary Lymphedema Wounding Event: Gradually Appeared Etiology: Date Acquired: 07/15/2021 Wound Open Weeks Of  Treatment: 1 Status: Clustered Wound: No Comorbid Sleep Apnea, Arrhythmia, Congestive Heart Failure, History: Hypertension, Hypotension, Peripheral Arterial Disease, Peripheral Venous Disease, Type II Diabetes, Gout, Osteoarthritis Photos Wound Measurements Length: (cm) 4 Width: (cm) 5.6 Depth: (cm) 0.1 Area: (cm) 17.593 Volume: (cm) 1.759 % Reduction in Area: -28.7% % Reduction in Volume: -28.7% Epithelialization: Small (1-33%) Tunneling: No Undermining: No Wound Description Classification: Full Thickness Without Exposed Support Structures Wound Margin: Distinct, outline attached Exudate Amount: Large Exudate Type: Serosanguineous Exudate Color: red, brown Foul Odor After Cleansing: Yes Due to Product Use: No Slough/Fibrino Yes Wound Bed Granulation Amount: Small (1-33%) Exposed Structure Granulation Quality: Red Fascia Exposed: No Necrotic Amount: Large (67-100%) Fat Layer (Subcutaneous Tissue) Exposed: Yes Necrotic Quality: Adherent Slough Tendon Exposed: No Muscle Exposed: No Joint Exposed: No Bone Exposed: No Treatment Notes Wound #12 (Lower Leg) Wound Laterality: Left, Anterior Cleanser Peri-Wound Care Zinc Oxide Ointment 30g tube Discharge Instruction: Apply Zinc Oxide to periwound with each dressing change Sween Lotion (Moisturizing lotion) Discharge Instruction: Apply moisturizing lotion as directed Topical Primary Dressing IODOFLEX 0.9% Cadexomer Iodine Pad 4x6 cm Discharge Instruction: Apply to wound bed as instructed  Secondary Dressing ABD Pad, 8x10 Discharge Instruction: Apply over primary dressing as directed. CarboFLEX Odor Control Dressing, 4x4 in Discharge Instruction: Apply over primary dressing as directed. Zetuvit Plus 4x8 in Discharge Instruction: Apply over primary dressing as directed. Secured With Compression Wrap ThreePress (3 layer compression wrap) Discharge Instruction: Apply three layer compression as directed. Compression  Stockings Add-Ons Electronic Signature(s) Signed: 08/11/2021 5:11:01 PM By: Adline Peals Signed: 08/12/2021 3:56:15 PM By: Rhae Hammock RN Previous Signature: 08/11/2021 4:10:56 PM Version By: Sandre Kitty Entered By: Adline Peals on 08/11/2021 16:12:42 -------------------------------------------------------------------------------- Wound Assessment Details Patient Name: Date of Service: Tirrell, [name] 08/11/2021 3:15 PM Medical Record Number: [id number] Patient Account Number: [id number] Date of Birth/Sex: Treating RN: [birth year] (66 y.o. [name], [name] Primary Care Nelida Mandarino: [name] Other Clinician: Referring Tiffany Calmes: Treating Mahathi Pokorney/Extender: [name] in Treatment: 1 Wound Status Wound Number: 7 Primary Venous Leg Ulcer Etiology: Wound Location: Right, Medial Malleolus Secondary Lymphedema Wounding Event: Blister Etiology: Date Acquired: 07/21/2021 Wound Open Weeks Of Treatment: 1 Status: Clustered Wound: No Comorbid Sleep Apnea, Arrhythmia, Congestive Heart Failure, History: Hypertension, Hypotension, Peripheral Arterial Disease, Peripheral Venous Disease, Type II Diabetes, Gout, Osteoarthritis Photos Wound Measurements Length: (cm) 5.5 Width: (cm) 8.5 Depth: (cm) 0.3 Area: (cm) 36.717 Volume: (cm) 11.015 % Reduction in Area: 4.1% % Reduction in Volume: 58.9% Epithelialization: None Tunneling: No Undermining: No Wound Description Classification: Full Thickness With Exposed Support Structures Wound Margin: Distinct, outline attached Exudate Amount: Large Exudate Type: Serosanguineous Exudate Color: red, brown Foul Odor After Cleansing: Yes Due to Product Use: No Slough/Fibrino Yes Wound Bed Granulation Amount: Small (1-33%) Exposed Structure Granulation Quality: Pink Fascia Exposed: No Necrotic Amount: Large (67-100%) Fat Layer (Subcutaneous Tissue) Exposed: Yes Necrotic Quality: Eschar, Adherent  Slough Tendon Exposed: No Muscle Exposed: No Joint Exposed: No Bone Exposed: No Treatment Notes Wound #7 (Malleolus) Wound Laterality: Right, Medial Cleanser Peri-Wound Care Zinc Oxide Ointment 30g tube Discharge Instruction: Apply Zinc Oxide to periwound with each dressing change Sween Lotion (Moisturizing lotion) Discharge Instruction: Apply moisturizing lotion as directed Topical Primary Dressing IODOFLEX 0.9% Cadexomer Iodine Pad 4x6 cm Discharge Instruction: Apply to wound bed as instructed Secondary Dressing ABD Pad, 8x10 Discharge Instruction: Apply over primary dressing as directed. CarboFLEX Odor Control Dressing, 4x4 in Discharge Instruction: Apply over primary dressing as directed. Zetuvit Plus 4x8 in Discharge Instruction: Apply over primary dressing as directed. Secured With Compression Wrap ThreePress (3 layer compression wrap) Discharge Instruction: Apply three layer compression as directed. Compression Stockings Add-Ons Electronic Signature(s) Signed: 08/11/2021 5:11:01 PM By: Adline Peals Signed: 08/12/2021 3:56:15 PM By: Rhae Hammock RN Previous Signature: 08/11/2021 4:10:56 PM Version By: Sandre Kitty Entered By: Adline Peals on 08/11/2021 16:13:14 -------------------------------------------------------------------------------- Wound Assessment Details Patient Name: Date of Service: Landa, [name] 08/11/2021 3:15 PM Medical Record Number: [id number] Patient Account Number: [id number] Date of Birth/Sex: Treating RN: [birth year] (66 y.o. [name], [name] Primary Care Jep Dyas: [name] Other Clinician: Referring Melat Wrisley: Treating Trejan Buda/Extender: [name] in Treatment: 1 Wound Status Wound Number: 8 Primary Venous Leg Ulcer Etiology: Wound Location: Right, Lateral Malleolus Secondary Lymphedema Wounding Event: Gradually Appeared Etiology: Date Acquired: 07/22/2021 Wound Open Weeks Of  Treatment: 1 Status: Clustered Wound: Yes Comorbid Sleep Apnea, Arrhythmia, Congestive Heart Failure, History: Hypertension, Hypotension, Peripheral Arterial Disease, Peripheral Venous Disease, Type II Diabetes, Gout, Osteoarthritis Photos Wound Measurements Length: (cm) 3 Width: (cm) 4 Depth: (cm) 0.1 Clustered Quantity: 3 Area: (cm) 9.425 Volume: (cm) 0.942 % Reduction in  Area: 59.2% % Reduction in Volume: 79.6% Epithelialization: Small (1-33%) Tunneling: No Undermining: No Wound Description Classification: Full Thickness Without Exposed Support Structures Wound Margin: Distinct, outline attached Exudate Amount: Large Exudate Type: Serosanguineous Exudate Color: red, brown Wound Bed Granulation Amount: Small (1-33%) Granulation Quality: Red, Pink Necrotic Amount: Large (67-100%) Necrotic Quality: Adherent Slough Foul Odor After Cleansing: Yes Due to Product Use: No Slough/Fibrino Yes Exposed Structure Fascia Exposed: No Fat Layer (Subcutaneous Tissue) Exposed: Yes Tendon Exposed: No Muscle Exposed: No Joint Exposed: No Bone Exposed: No Treatment Notes Wound #8 (Malleolus) Wound Laterality: Right, Lateral Cleanser Peri-Wound Care Zinc Oxide Ointment 30g tube Discharge Instruction: Apply Zinc Oxide to periwound with each dressing change Sween Lotion (Moisturizing lotion) Discharge Instruction: Apply moisturizing lotion as directed Topical Primary Dressing IODOFLEX 0.9% Cadexomer Iodine Pad 4x6 cm Discharge Instruction: Apply to wound bed as instructed Secondary Dressing ABD Pad, 8x10 Discharge Instruction: Apply over primary dressing as directed. CarboFLEX Odor Control Dressing, 4x4 in Discharge Instruction: Apply over primary dressing as directed. Zetuvit Plus 4x8 in Discharge Instruction: Apply over primary dressing as directed. Secured With Compression Wrap ThreePress (3 layer compression wrap) Discharge Instruction: Apply three layer compression as  directed. Compression Stockings Add-Ons Electronic Signature(s) Signed: 08/11/2021 5:11:01 PM By: Adline Peals Signed: 08/12/2021 3:56:15 PM By: Rhae Hammock RN Previous Signature: 08/11/2021 4:10:56 PM Version By: Sandre Kitty Entered By: Adline Peals on 08/11/2021 16:13:45 -------------------------------------------------------------------------------- Wound Assessment Details Patient Name: Date of Service: Mankins, [name] 08/11/2021 3:15 PM Medical Record Number: [id number] Patient Account Number: [id number] Date of Birth/Sex: Treating RN: 1955/01/31 (66 y.o. [name], [name] Primary Care Kaysan Peixoto: [name] Other Clinician: Referring Kynlie Jane: Treating Sherri Levenhagen/Extender: [name] in Treatment: 1 Wound Status Wound Number: 9 Primary Diabetic Wound/Ulcer of the Lower Extremity Etiology: Wound Location: Right T Second oe Wound Open Wounding Event: Gradually Appeared Status: Date Acquired: 07/15/2021 Comorbid Sleep Apnea, Arrhythmia, Congestive Heart Failure, Hypertension, Weeks Of Treatment: 1 Weeks Of Treatment: 1 History: Hypotension, Peripheral Arterial Disease, Peripheral Venous Clustered Wound: No Disease, Type II Diabetes, Gout, Osteoarthritis Photos Wound Measurements Length: (cm) 0.6 Width: (cm) 1.1 Depth: (cm) 0.1 Area: (cm) 0.518 Volume: (cm) 0.052 % Reduction in Area: 21.5% % Reduction in Volume: 21.2% Epithelialization: Small (1-33%) Tunneling: No Undermining: No Wound Description Classification: Grade 2 Wound Margin: Distinct, outline attached Exudate Amount: Medium Exudate Type: Serosanguineous Exudate Color: red, brown Foul Odor After Cleansing: Yes Due to Product Use: No Slough/Fibrino Yes Wound Bed Granulation Amount: Large (67-100%) Exposed Structure Granulation Quality: Red Fascia Exposed: No Necrotic Amount: Small (1-33%) Fat Layer (Subcutaneous Tissue) Exposed: Yes Necrotic Quality:  Adherent Slough Tendon Exposed: No Muscle Exposed: No Joint Exposed: No Bone Exposed: No Treatment Notes Wound #9 (Toe Second) Wound Laterality: Right Cleanser Wound Cleanser Discharge Instruction: Cleanse the wound with wound cleanser prior to applying a clean dressing using gauze sponges, not tissue or cotton balls. Peri-Wound Care Topical Primary Dressing KerraCel Ag Gelling Fiber Dressing, 2x2 in (silver alginate) Discharge Instruction: Apply silver alginate to wound bed as instructed Secondary Dressing Woven Gauze Sponges 2x2 in Discharge Instruction: Apply over primary dressing as directed. Secured With [employer] Surgical T 2x10 (in/yd) ape Discharge Instruction: Secure with tape as directed. Compression Wrap Compression Stockings Add-Ons Electronic Signature(s) Signed: 08/11/2021 5:11:01 PM By: Adline Peals Signed: 08/12/2021 3:56:15 PM By: Rhae Hammock RN Previous Signature: 08/11/2021 4:10:56 PM Version By: Sandre Kitty Entered By: Adline Peals on 08/11/2021 16:14:16 -------------------------------------------------------------------------------- Vitals Details Patient Name: Date  of Service: KYRUS, HYDE 08/11/2021 3:15 PM Medical Record Number: [id number] Patient Account Number: [id number] Date of Birth/Sex: Treating RN: [birth year] (66 y.o. [name], [name] Primary Care Ivey Nembhard: [name] Other Clinician: Referring Deann Mclaine: Treating Toma Arts/Extender: [name] in Treatment: 1 Vital Signs Time Taken: 16:07 Temperature (F): 98.5 Height (in): 71 Pulse (bpm): 85 Weight (lbs): 350 Respiratory Rate (breaths/min): 20 Body Mass Index (BMI): 48.8 Blood Pressure (mmHg): 123/78 Capillary Blood Glucose (mg/dl): 113 Reference Range: 80 - 120 mg / dl Electronic Signature(s) Signed: 08/11/2021 4:10:56 PM By: Sandre Kitty Entered By: Sandre Kitty on 08/11/2021 16:08:37

## 2021-08-16 ENCOUNTER — Encounter (HOSPITAL_BASED_OUTPATIENT_CLINIC_OR_DEPARTMENT_OTHER): Payer: Medicare Other | Admitting: Internal Medicine

## 2021-08-16 DIAGNOSIS — L97518 Non-pressure chronic ulcer of other part of right foot with other specified severity: Secondary | ICD-10-CM | POA: Diagnosis not present

## 2021-08-16 DIAGNOSIS — I890 Lymphedema, not elsewhere classified: Secondary | ICD-10-CM | POA: Diagnosis not present

## 2021-08-16 DIAGNOSIS — I5022 Chronic systolic (congestive) heart failure: Secondary | ICD-10-CM | POA: Diagnosis not present

## 2021-08-16 DIAGNOSIS — I110 Hypertensive heart disease with heart failure: Secondary | ICD-10-CM | POA: Diagnosis not present

## 2021-08-16 DIAGNOSIS — E1151 Type 2 diabetes mellitus with diabetic peripheral angiopathy without gangrene: Secondary | ICD-10-CM | POA: Diagnosis not present

## 2021-08-16 DIAGNOSIS — Z8572 Personal history of non-Hodgkin lymphomas: Secondary | ICD-10-CM | POA: Diagnosis not present

## 2021-08-16 DIAGNOSIS — L97828 Non-pressure chronic ulcer of other part of left lower leg with other specified severity: Secondary | ICD-10-CM | POA: Diagnosis not present

## 2021-08-16 DIAGNOSIS — E11621 Type 2 diabetes mellitus with foot ulcer: Secondary | ICD-10-CM | POA: Diagnosis not present

## 2021-08-16 DIAGNOSIS — Z87891 Personal history of nicotine dependence: Secondary | ICD-10-CM | POA: Diagnosis not present

## 2021-08-16 DIAGNOSIS — I87333 Chronic venous hypertension (idiopathic) with ulcer and inflammation of bilateral lower extremity: Secondary | ICD-10-CM | POA: Diagnosis not present

## 2021-08-16 DIAGNOSIS — I5020 Unspecified systolic (congestive) heart failure: Secondary | ICD-10-CM | POA: Diagnosis not present

## 2021-08-16 DIAGNOSIS — G4730 Sleep apnea, unspecified: Secondary | ICD-10-CM | POA: Diagnosis not present

## 2021-08-16 DIAGNOSIS — L97818 Non-pressure chronic ulcer of other part of right lower leg with other specified severity: Secondary | ICD-10-CM | POA: Diagnosis not present

## 2021-08-16 NOTE — Progress Notes (Signed)
[name] ([id number]) , Visit Report for 08/16/2021 SuperBill Details Patient Name: Date of Service: [name], [name] 08/16/2021 Medical Record Number: [id number] Patient Account Number: [id number] Date of Birth/Sex: Treating RN: [birth year] (66 y.o. [name] Primary Care Provider: [name] Other Clinician: Referring Provider: Treating Provider/Extender: Lucky Rathke in Treatment: 1 Diagnosis Coding ICD-10 Codes Code Description I89.0 Lymphedema, not elsewhere classified I87.333 Chronic venous hypertension (idiopathic) with ulcer and inflammation of bilateral lower extremity L97.828 Non-pressure chronic ulcer of other part of left lower leg with other specified severity L97.818 Non-pressure chronic ulcer of other part of right lower leg with other specified severity E11.621 Type 2 diabetes mellitus with foot ulcer L97.518 Non-pressure chronic ulcer of other part of right foot with other specified severity Facility Procedures CPT4 Description Modifier Quantity Code [id number] BILATERAL: Application of multi-layer venous compression system; leg (below knee), including ankle and 1 foot. ICD-10 Diagnosis Description L97.828 Non-pressure chronic ulcer of other part of left lower leg with other specified severity L97.818 Non-pressure chronic ulcer of other part of right lower leg with other specified severity Electronic Signature(s) Signed: 08/16/2021 4:20:20 PM By: Kalman Shan DO Signed: 08/16/2021 4:32:44 PM By: Lorrin Jackson Entered By: Lorrin Jackson on 08/16/2021 15:33:37

## 2021-08-16 NOTE — Progress Notes (Signed)
[name] ([id number]) , Visit Report for 08/16/2021 Arrival Information Details Patient Name: Date of Service: [name] 08/16/2021 2:45 PM Medical Record Number: [id number] Patient Account Number: [id number] Date of Birth/Sex: Treating RN: September 28, 1955 (66 y.o. [name] Primary Care Maleeah Crossman: [name] Other Clinician: Referring Tassie Pollett: Treating Kendall Justo/Extender: Lucky Rathke in Treatment: 1 Visit Information History Since Last Visit Added or deleted any medications: No Patient Arrived: Wheel Chair Any new allergies or adverse reactions: No Arrival Time: 14:40 Had a fall or experienced change in No Accompanied By: wife activities of daily living that may affect Transfer Assistance: Manual risk of falls: Patient Identification Verified: Yes Signs or symptoms of abuse/neglect since last visito No Secondary Verification Process Completed: Yes Hospitalized since last visit: No Patient Requires Transmission-Based Precautions: No Implantable device outside of the clinic excluding No Patient Has Alerts: Yes cellular tissue based products placed in the center Patient Alerts: Patient on Blood Thinner since last visit: 01/2021 ABI L 1.13 R 1.06 Has Dressing in Place as Prescribed: Yes 01/2021 TBI L amp R 0.8 Has Compression in Place as Prescribed: Yes Pain Present Now: No Electronic Signature(s) Signed: 08/16/2021 4:32:44 PM By: Lorrin Jackson Entered By: Lorrin Jackson on 08/16/2021 14:41:26 -------------------------------------------------------------------------------- Compression Therapy Details Patient Name: Date of Service: [name] Gloss D. 08/16/2021 2:45 PM Medical Record Number: [id number] Patient Account Number: [id number] Date of Birth/Sex: Treating RN: October 07, 1955 (66 y.o. [name] Primary Care Elfie Costanza: [name] Other Clinician: Referring Juanjose Mojica: Treating Evalette Montrose/Extender: Lucky Rathke in  Treatment: 1 Compression Therapy Performed for Wound Assessment: Wound #11 Left,Medial Lower Leg Performed By: Clinician Lorrin Jackson, RN Compression Type: Three Layer Electronic Signature(s) Signed: 08/16/2021 4:32:44 PM By: Lorrin Jackson Entered By: Lorrin Jackson on 08/16/2021 15:32:28 -------------------------------------------------------------------------------- Compression Therapy Details Patient Name: Date of Service: [name], [name] 08/16/2021 2:45 PM Medical Record Number: [id number] Patient Account Number: [id number] Date of Birth/Sex: Treating RN: [birth year] (66 y.o. [name] Primary Care Karmel Patricelli: [name] Other Clinician: Referring Ellin Fitzgibbons: Treating Gwendoline Judy/Extender: Lucky Rathke in Treatment: 1 Compression Therapy Performed for Wound Assessment: Wound #12 Left,Anterior Lower Leg Performed By: Clinician Lorrin Jackson, RN Compression Type: Three Layer Electronic Signature(s) Signed: 08/16/2021 4:32:44 PM By: Lorrin Jackson Entered By: Lorrin Jackson on 08/16/2021 15:32:28 -------------------------------------------------------------------------------- Compression Therapy Details Patient Name: Date of Service: [name], [name] 08/16/2021 2:45 PM Medical Record Number: [id number] Patient Account Number: [id number] Date of Birth/Sex: Treating RN: 01-30-1955 (66 y.o. [name] Primary Care Idy Rawling: [name] Other Clinician: Referring Honest Vanleer: Treating Tatsuya Okray/Extender: Lucky Rathke in Treatment: 1 Compression Therapy Performed for Wound Assessment: Wound #7 Right,Medial Malleolus Performed By: Clinician Lorrin Jackson, RN Compression Type: Three Layer Electronic Signature(s) Signed: 08/16/2021 4:32:44 PM By: Lorrin Jackson Entered By: Lorrin Jackson on 08/16/2021 15:32:28 -------------------------------------------------------------------------------- Compression Therapy Details Patient  Name: Date of Service: [name], [name] 08/16/2021 2:45 PM Medical Record Number: [id number] Patient Account Number: [id number] Date of Birth/Sex: Treating RN: 1955-12-03 (66 y.o. [name] Primary Care Trigger Frasier: [name] Other Clinician: Referring Mahlik Lenn: Treating Kimba Lottes/Extender: Lucky Rathke in Treatment: 1 Compression Therapy Performed for Wound Assessment: Wound #8 Right,Lateral Malleolus Performed By: Clinician Lorrin Jackson, RN Compression Type: Three Layer Electronic Signature(s) Signed: 08/16/2021 4:32:44 PM By: Lorrin Jackson Entered By: Lorrin Jackson on 08/16/2021 15:32:29 -------------------------------------------------------------------------------- Encounter Discharge Information Details Patient Name: Date of Service: [name], [name] 08/16/2021 2:45 PM Medical Record Number: [id number]  Patient Account Number: [id number] Date of Birth/Sex: Treating RN: 09-29-1955 (66 y.o. [name] Primary Care Daysha Ashmore: Other Clinician: [name] Referring Johnn Krasowski: Treating Griff Badley/Extender: Lucky Rathke in Treatment: 1 Encounter Discharge Information Items Discharge Condition: Stable Ambulatory Status: Wheelchair Discharge Destination: Home Transportation: Private Auto Accompanied By: wife Schedule Follow-up Appointment: Yes Clinical Summary of Care: Provided on 08/16/2021 Form Type Recipient Paper Patient Patient Electronic Signature(s) Signed: 08/16/2021 4:32:44 PM By: Lorrin Jackson Entered By: Lorrin Jackson on 08/16/2021 15:33:21 -------------------------------------------------------------------------------- Patient/Caregiver Education Details Patient Name: Date of Service: [name] 8/7/2023andnbsp2:45 PM Medical Record Number: [id number] Patient Account Number: [id number] Date of Birth/Gender: Treating RN: 17-Apr-1955 (66 y.o. [name] Primary Care Physician: [name] Other Clinician: Referring Physician: Treating Physician/Extender: Lucky Rathke in Treatment: 1 Education Assessment Education Provided To: Patient Education Topics Provided Venous: Methods: Explain/Verbal, Printed Responses: State content correctly Wound/Skin Impairment: Methods: Explain/Verbal, Printed Responses: State content correctly Electronic Signature(s) Signed: 08/16/2021 4:32:44 PM By: Lorrin Jackson Entered By: Lorrin Jackson on 08/16/2021 15:32:59 -------------------------------------------------------------------------------- Wound Assessment Details Patient Name: Date of Service: Horrell, [name] 08/16/2021 2:45 PM Medical Record Number: [id number] Patient Account Number: [id number] Date of Birth/Sex: Treating RN: February 03, 1955 (66 y.o. [name] Primary Care Tashiba Timoney: [name] Other Clinician: Referring Catrell Morrone: Treating Johneric Mcfadden/Extender: Lucky Rathke in Treatment: 1 Wound Status Wound Number: 11 Primary Venous Leg Ulcer Etiology: Wound Location: Left, Medial Lower Leg Secondary Lymphedema Wounding Event: Gradually Appeared Etiology: Date Acquired: 07/22/2021 Wound Open Weeks Of Treatment: 1 Status: Clustered Wound: No Comorbid Sleep Apnea, Arrhythmia, Congestive Heart Failure, History: Hypertension, Hypotension, Peripheral Arterial Disease, Peripheral Venous Disease, Type II Diabetes, Gout, Osteoarthritis Wound Measurements Length: (cm) 2.7 Width: (cm) 1.6 Depth: (cm) 0.1 Area: (cm) 3.393 Volume: (cm) 0.339 % Reduction in Area: 38.3% % Reduction in Volume: 38.4% Epithelialization: Small (1-33%) Tunneling: No Undermining: No Wound Description Classification: Full Thickness Without Exposed Support Structures Wound Margin: Distinct, outline attached Exudate Amount: Large Exudate Type: Serosanguineous Exudate Color: red, brown Foul Odor After Cleansing: Yes Due to Product Use:  No Slough/Fibrino Yes Wound Bed Granulation Amount: Medium (34-66%) Exposed Structure Granulation Quality: Red, Pink Fascia Exposed: No Necrotic Amount: Medium (34-66%) Fat Layer (Subcutaneous Tissue) Exposed: Yes Necrotic Quality: Adherent Slough Tendon Exposed: No Muscle Exposed: No Joint Exposed: No Bone Exposed: No Treatment Notes Wound #11 (Lower Leg) Wound Laterality: Left, Medial Cleanser Peri-Wound Care Zinc Oxide Ointment 30g tube Discharge Instruction: Apply Zinc Oxide to periwound with each dressing change Sween Lotion (Moisturizing lotion) Discharge Instruction: Apply moisturizing lotion as directed Topical Primary Dressing IODOFLEX 0.9% Cadexomer Iodine Pad 4x6 cm Discharge Instruction: Apply to wound bed as instructed Secondary Dressing ABD Pad, 8x10 Discharge Instruction: Apply over primary dressing as directed. CarboFLEX Odor Control Dressing, 4x4 in Discharge Instruction: Apply over primary dressing as directed. Zetuvit Plus 4x8 in Discharge Instruction: Apply over primary dressing as directed. Secured With Compression Wrap ThreePress (3 layer compression wrap) Discharge Instruction: Apply three layer compression as directed. Compression Stockings Add-Ons Electronic Signature(s) Signed: 08/16/2021 4:32:44 PM By: Lorrin Jackson Entered By: Lorrin Jackson on 08/16/2021 14:42:05 -------------------------------------------------------------------------------- Wound Assessment Details Patient Name: Date of Service: [place], [name] 08/16/2021 2:45 PM Medical Record Number: [id number] Patient Account Number: [id number] Date of Birth/Sex: Treating RN: 1955-10-26 (66 y.o. [name] Primary Care Ritisha Deitrick: [name] Other Clinician: Referring Kalyn Hofstra: Treating Jenniferann Stuckert/Extender: Lucky Rathke in Treatment: 1 Wound Status Wound Number: 12 Primary Venous Leg  Ulcer Etiology: Wound Location: Left, Anterior Lower Leg Secondary  Lymphedema Wounding Event: Gradually Appeared Etiology: Date Acquired: 07/15/2021 Wound Open Weeks Of Treatment: 1 Status: Clustered Wound: No Comorbid Sleep Apnea, Arrhythmia, Congestive Heart Failure, History: Hypertension, Hypotension, Peripheral Arterial Disease, Peripheral Venous Disease, Type II Diabetes, Gout, Osteoarthritis Wound Measurements Length: (cm) 4 Width: (cm) 5.6 Depth: (cm) 0.1 Area: (cm) 17.593 Volume: (cm) 1.759 % Reduction in Area: -28.7% % Reduction in Volume: -28.7% Epithelialization: Small (1-33%) Tunneling: No Undermining: No Wound Description Classification: Full Thickness Without Exposed Support Structures Wound Margin: Distinct, outline attached Exudate Amount: Large Exudate Type: Serosanguineous Exudate Color: red, brown Foul Odor After Cleansing: Yes Due to Product Use: No Slough/Fibrino Yes Wound Bed Granulation Amount: Small (1-33%) Exposed Structure Granulation Quality: Red Fascia Exposed: No Necrotic Amount: Large (67-100%) Fat Layer (Subcutaneous Tissue) Exposed: Yes Necrotic Quality: Adherent Slough Tendon Exposed: No Muscle Exposed: No Joint Exposed: No Bone Exposed: No Treatment Notes Wound #12 (Lower Leg) Wound Laterality: Left, Anterior Cleanser Peri-Wound Care Zinc Oxide Ointment 30g tube Discharge Instruction: Apply Zinc Oxide to periwound with each dressing change Sween Lotion (Moisturizing lotion) Discharge Instruction: Apply moisturizing lotion as directed Topical Primary Dressing IODOFLEX 0.9% Cadexomer Iodine Pad 4x6 cm Discharge Instruction: Apply to wound bed as instructed Secondary Dressing ABD Pad, 8x10 Discharge Instruction: Apply over primary dressing as directed. CarboFLEX Odor Control Dressing, 4x4 in Discharge Instruction: Apply over primary dressing as directed. Zetuvit Plus 4x8 in Discharge Instruction: Apply over primary dressing as directed. Secured With Compression Wrap ThreePress (3 layer  compression wrap) Discharge Instruction: Apply three layer compression as directed. Compression Stockings Add-Ons Electronic Signature(s) Signed: 08/16/2021 4:32:44 PM By: Lorrin Jackson Entered By: Lorrin Jackson on 08/16/2021 14:42:28 -------------------------------------------------------------------------------- Wound Assessment Details Patient Name: Date of Service: Delio, [name] 08/16/2021 2:45 PM Medical Record Number: [id number] Patient Account Number: [id number] Date of Birth/Sex: Treating RN: [birth year] (66 y.o. [name] Primary Care Ani Deoliveira: [name] Other Clinician: Referring Diondre Pulis: Treating Shanyla Marconi/Extender: Lucky Rathke in Treatment: 1 Wound Status Wound Number: 7 Primary Venous Leg Ulcer Etiology: Wound Location: Right, Medial Malleolus Secondary Lymphedema Wounding Event: Blister Etiology: Date Acquired: 07/21/2021 Wound Open Weeks Of Treatment: 1 Status: Clustered Wound: No Comorbid Sleep Apnea, Arrhythmia, Congestive Heart Failure, History: Hypertension, Hypotension, Peripheral Arterial Disease, Peripheral Venous Disease, Type II Diabetes, Gout, Osteoarthritis Wound Measurements Length: (cm) 5.5 Width: (cm) 8.5 Depth: (cm) 0.3 Area: (cm) 36.717 Volume: (cm) 11.015 % Reduction in Area: 4.1% % Reduction in Volume: 58.9% Epithelialization: None Tunneling: No Undermining: No Wound Description Classification: Full Thickness With Exposed Support Structures Wound Margin: Distinct, outline attached Exudate Amount: Large Exudate Type: Serosanguineous Exudate Color: red, brown Foul Odor After Cleansing: Yes Due to Product Use: No Slough/Fibrino Yes Wound Bed Granulation Amount: Small (1-33%) Exposed Structure Granulation Quality: Pink Fascia Exposed: No Necrotic Amount: Large (67-100%) Fat Layer (Subcutaneous Tissue) Exposed: Yes Necrotic Quality: Eschar, Adherent Slough Tendon Exposed: No Muscle Exposed:  No Joint Exposed: No Bone Exposed: No Treatment Notes Wound #7 (Malleolus) Wound Laterality: Right, Medial Cleanser Peri-Wound Care Zinc Oxide Ointment 30g tube Discharge Instruction: Apply Zinc Oxide to periwound with each dressing change Sween Lotion (Moisturizing lotion) Discharge Instruction: Apply moisturizing lotion as directed Topical Primary Dressing IODOFLEX 0.9% Cadexomer Iodine Pad 4x6 cm Discharge Instruction: Apply to wound bed as instructed Secondary Dressing ABD Pad, 8x10 Discharge Instruction: Apply over primary dressing as directed. CarboFLEX Odor Control Dressing, 4x4 in Discharge Instruction: Apply over primary dressing as directed. Zetuvit Plus  4x8 in Discharge Instruction: Apply over primary dressing as directed. Secured With Compression Wrap ThreePress (3 layer compression wrap) Discharge Instruction: Apply three layer compression as directed. Compression Stockings Add-Ons Electronic Signature(s) Signed: 08/16/2021 4:32:44 PM By: Lorrin Jackson Entered By: Lorrin Jackson on 08/16/2021 14:42:51 -------------------------------------------------------------------------------- Wound Assessment Details Patient Name: Date of Service: [place], [name] 08/16/2021 2:45 PM Medical Record Number: [id number] Patient Account Number: [id number] Date of Birth/Sex: Treating RN: 01/29/1955 (66 y.o. [name] Primary Care Ada Holness: [name] Other Clinician: Referring Remingtyn Depaola: Treating Tova Vater/Extender: Lucky Rathke in Treatment: 1 Wound Status Wound Number: 8 Primary Venous Leg Ulcer Etiology: Wound Location: Right, Lateral Malleolus Secondary Lymphedema Wounding Event: Gradually Appeared Etiology: Date Acquired: 07/22/2021 Wound Open Weeks Of Treatment: 1 Status: Clustered Wound: Yes Comorbid Sleep Apnea, Arrhythmia, Congestive Heart Failure, History: Hypertension, Hypotension, Peripheral Arterial Disease, Peripheral  Venous Disease, Type II Diabetes, Gout, Osteoarthritis Wound Measurements Length: (cm) 3 Width: (cm) 4 Depth: (cm) 0.1 Clustered Quantity: 3 Area: (cm) 9.425 Volume: (cm) 0.942 % Reduction in Area: 59.2% % Reduction in Volume: 79.6% Epithelialization: Small (1-33%) Tunneling: No Undermining: No Wound Description Classification: Full Thickness Without Exposed Support Structures Wound Margin: Distinct, outline attached Exudate Amount: Large Exudate Type: Serosanguineous Exudate Color: red, brown Wound Bed Granulation Amount: Small (1-33%) Granulation Quality: Red, Pink Necrotic Amount: Large (67-100%) Necrotic Quality: Adherent Slough Foul Odor After Cleansing: Yes Due to Product Use: No Slough/Fibrino Yes Exposed Structure Fascia Exposed: No Fat Layer (Subcutaneous Tissue) Exposed: Yes Tendon Exposed: No Muscle Exposed: No Joint Exposed: No Bone Exposed: No Treatment Notes Wound #8 (Malleolus) Wound Laterality: Right, Lateral Cleanser Peri-Wound Care Zinc Oxide Ointment 30g tube Discharge Instruction: Apply Zinc Oxide to periwound with each dressing change Sween Lotion (Moisturizing lotion) Discharge Instruction: Apply moisturizing lotion as directed Topical Primary Dressing IODOFLEX 0.9% Cadexomer Iodine Pad 4x6 cm Discharge Instruction: Apply to wound bed as instructed Secondary Dressing ABD Pad, 8x10 Discharge Instruction: Apply over primary dressing as directed. CarboFLEX Odor Control Dressing, 4x4 in Discharge Instruction: Apply over primary dressing as directed. Zetuvit Plus 4x8 in Discharge Instruction: Apply over primary dressing as directed. Secured With Compression Wrap ThreePress (3 layer compression wrap) Discharge Instruction: Apply three layer compression as directed. Compression Stockings Add-Ons Electronic Signature(s) Signed: 08/16/2021 4:32:44 PM By: Lorrin Jackson Entered By: Lorrin Jackson on 08/16/2021  14:43:12 -------------------------------------------------------------------------------- Vitals Details Patient Name: Date of Service: [place], [name] 08/16/2021 2:45 PM Medical Record Number: [id number] Patient Account Number: [id number] Date of Birth/Sex: Treating RN: 05-24-1955 (66 y.o. [name] Primary Care Jamyron Redd: [name] Other Clinician: Referring Kaylianna Detert: Treating Zailyn Rowser/Extender: Lucky Rathke in Treatment: 1 Vital Signs Time Taken: 14:46 Temperature (F): 98.4 Height (in): 71 Pulse (bpm): 71 Weight (lbs): 350 Respiratory Rate (breaths/min): 20 Body Mass Index (BMI): 48.8 Blood Pressure (mmHg): 92/51 Reference Range: 80 - 120 mg / dl Electronic Signature(s) Signed: 08/16/2021 4:32:44 PM By: Lorrin Jackson Entered By: Lorrin Jackson on 08/16/2021 14:49:01

## 2021-08-17 ENCOUNTER — Telehealth (HOSPITAL_BASED_OUTPATIENT_CLINIC_OR_DEPARTMENT_OTHER): Payer: Self-pay

## 2021-08-17 DIAGNOSIS — I5020 Unspecified systolic (congestive) heart failure: Secondary | ICD-10-CM

## 2021-08-17 LAB — BASIC METABOLIC PANEL WITH GFR
BUN/Creatinine Ratio: 15 (ref 10–24)
BUN: 24 mg/dL (ref 8–27)
CO2: 24 mmol/L (ref 20–29)
Calcium: 9.1 mg/dL (ref 8.6–10.2)
Chloride: 95 mmol/L — ABNORMAL LOW (ref 96–106)
Creatinine, Ser: 1.57 mg/dL — ABNORMAL HIGH (ref 0.76–1.27)
Glucose: 103 mg/dL — ABNORMAL HIGH (ref 70–99)
Potassium: 3.5 mmol/L (ref 3.5–5.2)
Sodium: 136 mmol/L (ref 134–144)
eGFR: 48 mL/min/1.73 — ABNORMAL LOW

## 2021-08-17 NOTE — Telephone Encounter (Addendum)
Called results to patient and left results on VM (ok per DPR), instructions left to call office back if patient has any questions!       ----- Message from Loel Dubonnet, NP sent at 08/17/2021  7:51 AM EDT ----- Kidney function decreased from previous.  Normal electrolytes.  Please instruct him to hold torsemide for 2 days.  Ensure restriction to less than 2 L of fluid intake per day.  After 2 days, resume torsemide at 20 mg daily.  BMP on 8/14 (day of his echocardiogram)

## 2021-08-18 ENCOUNTER — Encounter (HOSPITAL_BASED_OUTPATIENT_CLINIC_OR_DEPARTMENT_OTHER): Payer: Medicare Other | Admitting: Physician Assistant

## 2021-08-18 DIAGNOSIS — E1151 Type 2 diabetes mellitus with diabetic peripheral angiopathy without gangrene: Secondary | ICD-10-CM | POA: Diagnosis not present

## 2021-08-18 DIAGNOSIS — L97828 Non-pressure chronic ulcer of other part of left lower leg with other specified severity: Secondary | ICD-10-CM | POA: Diagnosis not present

## 2021-08-18 DIAGNOSIS — L97512 Non-pressure chronic ulcer of other part of right foot with fat layer exposed: Secondary | ICD-10-CM | POA: Diagnosis not present

## 2021-08-18 DIAGNOSIS — I87333 Chronic venous hypertension (idiopathic) with ulcer and inflammation of bilateral lower extremity: Secondary | ICD-10-CM | POA: Diagnosis not present

## 2021-08-18 DIAGNOSIS — I5022 Chronic systolic (congestive) heart failure: Secondary | ICD-10-CM | POA: Diagnosis not present

## 2021-08-18 DIAGNOSIS — Z8572 Personal history of non-Hodgkin lymphomas: Secondary | ICD-10-CM | POA: Diagnosis not present

## 2021-08-18 DIAGNOSIS — G4730 Sleep apnea, unspecified: Secondary | ICD-10-CM | POA: Diagnosis not present

## 2021-08-18 DIAGNOSIS — L97312 Non-pressure chronic ulcer of right ankle with fat layer exposed: Secondary | ICD-10-CM | POA: Diagnosis not present

## 2021-08-18 DIAGNOSIS — L97518 Non-pressure chronic ulcer of other part of right foot with other specified severity: Secondary | ICD-10-CM | POA: Diagnosis not present

## 2021-08-18 DIAGNOSIS — I110 Hypertensive heart disease with heart failure: Secondary | ICD-10-CM | POA: Diagnosis not present

## 2021-08-18 DIAGNOSIS — L97818 Non-pressure chronic ulcer of other part of right lower leg with other specified severity: Secondary | ICD-10-CM | POA: Diagnosis not present

## 2021-08-18 DIAGNOSIS — Z87891 Personal history of nicotine dependence: Secondary | ICD-10-CM | POA: Diagnosis not present

## 2021-08-18 DIAGNOSIS — E11621 Type 2 diabetes mellitus with foot ulcer: Secondary | ICD-10-CM | POA: Diagnosis not present

## 2021-08-18 DIAGNOSIS — I890 Lymphedema, not elsewhere classified: Secondary | ICD-10-CM | POA: Diagnosis not present

## 2021-08-18 NOTE — Progress Notes (Addendum)
ORSON RHO ([id number]) , Visit Report for 08/18/2021 Chief Complaint Document Details Patient Name: Date of Service: [name], MAHLER 08/18/2021 3:15 PM Medical Record Number: [id number] Patient Account Number: [id number] Date of Birth/Sex: Treating RN: 23-Aug-1955 (66 y.o. [name], [name] Primary Care Provider: [name] Other Clinician: Referring Provider: Treating Provider/Extender: [name] in Treatment: 2 Information Obtained from: Patient Chief Complaint Patients presents for treatment of an open diabetic ulcer (x2) 08/04/2021; patient returns to clinic with bilateral leg wounds as well as areas on the right foot Electronic Signature(s) Signed: 08/18/2021 3:35:13 PM By: [name] PA-C Entered By: [name] on 08/18/2021 15:35:13 -------------------------------------------------------------------------------- HPI Details Patient Name: Date of Service: [name], [name] 08/18/2021 3:15 PM Medical Record Number: [id number] Patient Account Number: [id number] Date of Birth/Sex: Treating RN: [birth year] (66 y.o. [name], [name] Primary Care Provider: [name] Other Clinician: Referring Provider: Treating Provider/Extender: [name] in Treatment: 2 History of Present Illness HPI Description: ADMISSION 03/22/2021 This is a 66 year old man with a past medical history significant for diabetes type 2, congestive heart failure, peripheral arterial disease, morbid obesity, venous insufficiency, and coronary artery disease. He has been followed by [name] in podiatry, who performed a transmetatarsal amputation on the left foot in August 2022. He had issues healing that wound, but based upon Dr. [name] notes, ultimately the TMA wound healed. During his recovery from that surgery, however, ulcers opened up over the DIP joint of the right second and third toe. These have apparently closed and reopened multiple  times. It sounds like one of the issues has been moisture accumulation and maceration of the tissues causing them to reopen. At his last visit with [name], on March 01, 2021, there continues to be problems with moisture and he was referred to wound care for further evaluation and management. He had a formal aortogram with runoff performed prior to his TMA. The findings are copied here: Patient has inline flow to both feet with no significant flow-limiting lesion that would be amenable to percutaneous or open revascularization. He does have an element of small vessel disease and has a short segment occlusion of the distal anterior tibial/dorsalis pedis artery on the left foot but does have posterior tibial artery flow. Would recommend management of wounds with amputation of toes 2 and 3 on the right foot if the wounds do not heal and deteriorate. Transmetatarsal amputation on the left side has as good a blood supply as it is going to get and hopefully this will heal in the future. Formal ABIs were done in January 2023. They are normal bilaterally. ABI Findings: +---------+------------------+-----+----------+--------+ Right Rt Pressure (mmHg)IndexWaveform Comment  +---------+------------------+-----+----------+--------+ Brachial 160     +---------+------------------+-----+----------+--------+ PTA 192 1.06 monophasic  +---------+------------------+-----+----------+--------+ DP 159 0.88 monophasic  +---------+------------------+-----+----------+--------+ Great T oe145 0.80    +---------+------------------+-----+----------+--------+ +--------+------------------+-----+---------+-------+ Left Lt Pressure (mmHg)IndexWaveform Comment +--------+------------------+-----+---------+-------+ [id number]     +--------+------------------+-----+---------+-------+ PTA 204 1.13 triphasic  +--------+------------------+-----+---------+-------+ DP 194  1.07 biphasic   +--------+------------------+-----+---------+-------+ +-------+-----------+-----------+------------+------------+ ABI/TBIT oday's ABIT oday's TBIPrevious ABIPrevious TBI +-------+-----------+-----------+------------+------------+ Right 1.06 0.8 1.26 0.65  +-------+-----------+-----------+------------+------------+ Left 1.13 amputation 1.15 amputation  +-------+-----------+-----------+------------+------------+ Previous ABI on 08/06/20 at [facility] Pedal pressures falsely elevated due to medial calcification. Summary: Right: Resting right ankle-brachial index is within normal range. The right toe-brachial index is normal. Left: Resting left ankle-brachial index is within normal range. READMISSION 08/04/2021 [name] is now a 66 year old man who I remember from this clinic many  years ago I think he had a right lower extremity predominantly venous wound at the time. He was here for 1 visit in March of this year had wounds on his right second and third toes we apparently dressed them many and they healed so he did not come back. He is listed in [place] is being a diabetic although the patient denies this says he is verified it with his primary doctor. In any case over the last several weeks or so according the patient although these wounds look somewhat more chronic than that he has developed predominantly large wounds on the right medial and right lateral ankle smaller areas on the left leg and areas on the dorsal aspect of his right second and third toes. Its not clear how he has been dressing these. 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 The patient has known PAD. He had an angiogram in August 2022 at which time he had nonhealing wounds in both feet. On the left his major vessels in the thigh were all patent. He had three-vessel patent to the level of the ankle. He had a  very short occlusion in the left anterior tibial. On the right lower extremity the major vessels in his thigh were all patent. He had three-vessel runoff to the foot sluggish filling of the anterior tibial artery. He was felt to have some component of small vessel disease but nothing that was amenable or needed revascularization. It was recommended that he have amputation of the second and third toes on the right foot if they did not heal He has been following with [name] of podiatry. [name] got him juxta lite stockings although I do not think he had them on properly he has uncontrolled edema in both legs Past medical history includes type 2 diabetes [although the patient really denies this], left TMA in 2022,lower extremity wounds in fact attendance at this clinic in 2009-2010, A-fib on Eliquis, chronic venous insufficiency with secondary lymphedema history of non-Hodgkin's lymphoma. 08-11-2021 upon evaluation today patient presents for follow-up evaluation he was seen last Wednesday initially for inspection here in our clinic. With that being said he tells me that he unfortunately has been having a lot of drainage and is actually coming through his wrap. Fortunately I do not see any evidence of active infection locally or systemically at this time which is great news. No fevers, chills, nausea, vomiting, or diarrhea. With that being said there does appear to be some evidence of local infection based on what I am seeing today. 08-18-2021 upon evaluation today patient appears to be doing okay currently in regard to his wounds with that being said that he is doing much better but still has a long ways to go to get to where he wanted to be. I think the infection is significantly improved. He has another week of the antibiotic at this point. Electronic Signature(s) Signed: 08/20/2021 5:37:07 PM By: [name] PA-C Previous Signature: 08/20/2021 5:36:55 PM Version By: [name] PA-C Entered  By: [name] on 08/20/2021 17:37:07 -------------------------------------------------------------------------------- Physical Exam Details Patient Name: Date of Service: [name], [name] 08/18/2021 3:15 PM Medical Record Number: [id number] Patient Account Number: [id number] Date of Birth/Sex: Treating RN: 05/23/1955 (66 y.o. [name] Noe Primary Care Provider: [name] Other Clinician: Referring Provider: Treating Provider/Extender: [name] in Treatment: 2 Constitutional Well-nourished and well-hydrated in  no acute distress. Respiratory normal breathing without difficulty. Psychiatric this patient is able to make decisions and demonstrates good insight into disease process. Alert and Oriented x 3. pleasant and cooperative. Notes Patient's wounds currently are actually showing signs of significant improvement which is great news and overall very pleased in that regard. I do not see any evidence of active infection locally or systemically at this time which is great news and overall very pleased. Electronic Signature(s) Signed: 08/20/2021 5:37:19 PM By: [name] PA-C Entered By: [name] on 08/20/2021 17:37:19 -------------------------------------------------------------------------------- Physician Orders Details Patient Name: Date of Service: [place], [name] 08/18/2021 3:15 PM Medical Record Number: [id number] Patient Account Number: [id number] Date of Birth/Sex: Treating RN: October 02, 1955 (66 y.o. [name], [name] Primary Care Provider: [name] Other Clinician: Referring Provider: Treating Provider/Extender: [name] in Treatment: 2 Verbal / Phone Orders: No Diagnosis Coding ICD-10 Coding Code Description I89.0 Lymphedema, not elsewhere classified I87.333 Chronic venous hypertension (idiopathic) with ulcer and inflammation of bilateral lower extremity L97.828 Non-pressure chronic ulcer of  other part of left lower leg with other specified severity L97.818 Non-pressure chronic ulcer of other part of right lower leg with other specified severity E11.621 Type 2 diabetes mellitus with foot ulcer L97.518 Non-pressure chronic ulcer of other part of right foot with other specified severity Follow-up Appointments ppointment in 1 week. - 08/25/21 @ 0800 w/ Jeri Cos and [place] Rm # 7 Return A 09/01/21 @ 3:00 w/ Jeri Cos and [place], [place] # 8 Nurse Visit: - Monday 08/23/21 @ 3:45 w/ [name] Rm # 8 Friday 09/03/21 @ 8:00 Overflow Rm #6 Other: - Finish taking your antibiotics. We are sending your culture to [facility] so that a compounded antibiotic ointment will be made and sent to you via mail. [facility] wound care supplies for your toes. Bathing/ Shower/ Hygiene May shower with protection but do not get wound dressing(s) wet. Edema Control - Lymphedema / SCD / Other Elevate legs to the level of the heart or above for 30 minutes daily and/or when sitting, a frequency of: - 3-4 times a day throughout the day. Avoid standing for long periods of time. Exercise regularly Wound Treatment Wound #10 - T Third oe Wound Laterality: Right Cleanser: Wound Cleanser Every Other Day/30 Days Discharge Instructions: Cleanse the wound with wound cleanser prior to applying a clean dressing using gauze sponges, not tissue or cotton balls. Topical: keystone Every Other Day/30 Days Discharge Instructions: Begin using topically to wounds once it gets sent to your house. Prim Dressing: KerraCel Ag Gelling Fiber Dressing, 4x5 in (silver alginate) Every Other Day/30 Days ary Discharge Instructions: Apply silver alginate to wound bed as instructed Secondary Dressing: Woven Gauze Sponges 2x2 in Every Other Day/30 Days Discharge Instructions: Apply over primary dressing as directed. Secured With: 62M Medipore Public affairs consultant Surgical T 2x10 (in/yd) Every Other Day/30 Days ape Discharge Instructions: Secure with  tape as directed. Wound #11 - Lower Leg Wound Laterality: Left, Medial Peri-Wound Care: Zinc Oxide Ointment 30g tube 1 x Per Week/30 Days Discharge Instructions: Apply Zinc Oxide to periwound with each dressing change Peri-Wound Care: Sween Lotion (Moisturizing lotion) 1 x Per Week/30 Days Discharge Instructions: Apply moisturizing lotion as directed Topical: Keystone 1 x Per Week/30 Days Prim Dressing: IODOFLEX 0.9% Cadexomer Iodine Pad 4x6 cm 1 x Per Week/30 Days ary Discharge Instructions: Apply to wound bed as instructed Secondary Dressing: ABD Pad, 8x10 1 x Per Week/30 Days Discharge Instructions: Apply over primary  dressing as directed. Secondary Dressing: CarboFLEX Odor Control Dressing, 4x4 in 1 x Per Week/30 Days Discharge Instructions: Apply over primary dressing as directed. Secondary Dressing: Zetuvit Plus 4x8 in 1 x Per Week/30 Days Discharge Instructions: Apply over primary dressing as directed. Compression Wrap: ThreePress (3 layer compression wrap) 1 x Per Week/30 Days Discharge Instructions: Apply three layer compression as directed. Wound #12 - Lower Leg Wound Laterality: Left, Anterior Peri-Wound Care: Zinc Oxide Ointment 30g tube 1 x Per Week/30 Days Discharge Instructions: Apply Zinc Oxide to periwound with each dressing change Peri-Wound Care: Sween Lotion (Moisturizing lotion) 1 x Per Week/30 Days Discharge Instructions: Apply moisturizing lotion as directed Topical: Keystone 1 x Per Week/30 Days Prim Dressing: IODOFLEX 0.9% Cadexomer Iodine Pad 4x6 cm 1 x Per Week/30 Days ary Discharge Instructions: Apply to wound bed as instructed Secondary Dressing: ABD Pad, 8x10 1 x Per Week/30 Days Discharge Instructions: Apply over primary dressing as directed. Secondary Dressing: CarboFLEX Odor Control Dressing, 4x4 in 1 x Per Week/30 Days Discharge Instructions: Apply over primary dressing as directed. Secondary Dressing: Zetuvit Plus 4x8 in 1 x Per Week/30  Days Discharge Instructions: Apply over primary dressing as directed. Compression Wrap: ThreePress (3 layer compression wrap) 1 x Per Week/30 Days Discharge Instructions: Apply three layer compression as directed. Wound #7 - Malleolus Wound Laterality: Right, Medial Peri-Wound Care: Zinc Oxide Ointment 30g tube 1 x Per Week/30 Days Discharge Instructions: Apply Zinc Oxide to periwound with each dressing change Peri-Wound Care: Sween Lotion (Moisturizing lotion) 1 x Per Week/30 Days Discharge Instructions: Apply moisturizing lotion as directed Topical: Keystone 1 x Per Week/30 Days Prim Dressing: IODOFLEX 0.9% Cadexomer Iodine Pad 4x6 cm 1 x Per Week/30 Days ary Discharge Instructions: Apply to wound bed as instructed Secondary Dressing: ABD Pad, 8x10 1 x Per Week/30 Days Discharge Instructions: Apply over primary dressing as directed. Secondary Dressing: CarboFLEX Odor Control Dressing, 4x4 in 1 x Per Week/30 Days Discharge Instructions: Apply over primary dressing as directed. Secondary Dressing: Zetuvit Plus 4x8 in 1 x Per Week/30 Days Discharge Instructions: Apply over primary dressing as directed. Compression Wrap: ThreePress (3 layer compression wrap) 1 x Per Week/30 Days Discharge Instructions: Apply three layer compression as directed. Wound #8 - Malleolus Wound Laterality: Right, Lateral Peri-Wound Care: Zinc Oxide Ointment 30g tube 1 x Per Week/30 Days Discharge Instructions: Apply Zinc Oxide to periwound with each dressing change Peri-Wound Care: Sween Lotion (Moisturizing lotion) 1 x Per Week/30 Days Discharge Instructions: Apply moisturizing lotion as directed Topical: Keystone 1 x Per Week/30 Days Prim Dressing: IODOFLEX 0.9% Cadexomer Iodine Pad 4x6 cm 1 x Per Week/30 Days ary Discharge Instructions: Apply to wound bed as instructed Secondary Dressing: ABD Pad, 8x10 1 x Per Week/30 Days Discharge Instructions: Apply over primary dressing as directed. Secondary Dressing:  CarboFLEX Odor Control Dressing, 4x4 in 1 x Per Week/30 Days Discharge Instructions: Apply over primary dressing as directed. Secondary Dressing: Zetuvit Plus 4x8 in 1 x Per Week/30 Days Discharge Instructions: Apply over primary dressing as directed. Compression Wrap: ThreePress (3 layer compression wrap) 1 x Per Week/30 Days Discharge Instructions: Apply three layer compression as directed. Wound #9 - T Second oe Wound Laterality: Right Cleanser: Wound Cleanser Every Other Day/30 Days Discharge Instructions: Cleanse the wound with wound cleanser prior to applying a clean dressing using gauze sponges, not tissue or cotton balls. Topical: keystone Every Other Day/30 Days Discharge Instructions: Begin using topically to wounds once it gets sent to your house. Prim Dressing: KerraCel Ag Heidi Dach  Fiber Dressing, 4x5 in (silver alginate) Every Other Day/30 Days ary Discharge Instructions: Apply silver alginate to wound bed as instructed Secondary Dressing: Woven Gauze Sponges 2x2 in Every Other Day/30 Days Discharge Instructions: Apply over primary dressing as directed. Secured With: 42M Medipore Public affairs consultant Surgical T 2x10 (in/yd) Every Other Day/30 Days ape Discharge Instructions: Secure with tape as directed. Electronic Signature(s) Signed: 08/19/2021 3:04:24 PM By: [name] PA-C Signed: 08/19/2021 5:01:19 PM By: Rhae Hammock RN Entered By: Rhae Hammock on 08/18/2021 17:01:30 -------------------------------------------------------------------------------- Problem List Details Patient Name: Date of Service: [name], [name] 08/18/2021 3:15 PM Medical Record Number: [id number] Patient Account Number: [id number] Date of Birth/Sex: Treating RN: 1955/09/22 (65 y.o. [name], [name] Primary Care Provider: [name] Other Clinician: Referring Provider: Treating Provider/Extender: [name] in Treatment: 2 Active Problems ICD-10 Encounter Code  Description Active Date MDM Diagnosis I89.0 Lymphedema, not elsewhere classified 08/04/2021 No Yes I87.333 Chronic venous hypertension (idiopathic) with ulcer and inflammation of 08/04/2021 No Yes bilateral lower extremity L97.828 Non-pressure chronic ulcer of other part of left lower leg with other specified 08/04/2021 No Yes severity L97.818 Non-pressure chronic ulcer of other part of right lower leg with other specified 08/04/2021 No Yes severity E11.621 Type 2 diabetes mellitus with foot ulcer 08/04/2021 No Yes L97.518 Non-pressure chronic ulcer of other part of right foot with other specified 08/04/2021 No Yes severity Inactive Problems Resolved Problems Electronic Signature(s) Signed: 08/18/2021 3:35:01 PM By: [name] PA-C Entered By: [name] on 08/18/2021 15:35:01 -------------------------------------------------------------------------------- Progress Note Details Patient Name: Date of Service: [name], [name] 08/18/2021 3:15 PM Medical Record Number: [id number] Patient Account Number: [id number] Date of Birth/Sex: Treating RN: 04/08/1955 (66 y.o. [name], [name] Primary Care Provider: [name] Other Clinician: Referring Provider: Treating Provider/Extender: [name] in Treatment: 2 Subjective Chief Complaint Information obtained from Patient Patients presents for treatment of an open diabetic ulcer (x2) 08/04/2021; patient returns to clinic with bilateral leg wounds as well as areas on the right foot History of Present Illness (HPI) ADMISSION 03/22/2021 This is a 66 year old man with a past medical history significant for diabetes type 2, congestive heart failure, peripheral arterial disease, morbid obesity, venous insufficiency, and coronary artery disease. He has been followed by [name] in podiatry, who performed a transmetatarsal amputation on the left foot in August 2022. He had issues healing that wound, but based upon Dr.  [name] notes, ultimately the TMA wound healed. During his recovery from that surgery, however, ulcers opened up over the DIP joint of the right second and third toe. These have apparently closed and reopened multiple times. It sounds like one of the issues has been moisture accumulation and maceration of the tissues causing them to reopen. At his last visit with [name], on March 01, 2021, there continues to be problems with moisture and he was referred to wound care for further evaluation and management. He had a formal aortogram with runoff performed prior to his TMA. The findings are copied here: Patient has inline flow to both feet with no significant flow-limiting lesion that would be amenable to percutaneous or open revascularization. He does have an element of small vessel disease and has a short segment occlusion of the distal anterior tibial/dorsalis pedis artery on the left foot but does have posterior tibial artery flow. Would recommend management of wounds with amputation of toes 2 and 3 on the right foot if the wounds do not  heal and deteriorate. Transmetatarsal amputation on the left side has as good a blood supply as it is going to get and hopefully this will heal in the future. Formal ABIs were done in January 2023. They are normal bilaterally. ABI Findings: +---------+------------------+-----+----------+--------+ Right Rt Pressure (mmHg)IndexWaveform Comment  +---------+------------------+-----+----------+--------+ Brachial 160    +---------+------------------+-----+----------+--------+ PTA 192 1.06 monophasic  +---------+------------------+-----+----------+--------+ DP 159 0.88 monophasic  +---------+------------------+-----+----------+--------+ Great T oe145 0.80    +---------+------------------+-----+----------+--------+ +--------+------------------+-----+---------+-------+ Left Lt Pressure (mmHg)IndexWaveform  Comment +--------+------------------+-----+---------+-------+ [id number]    +--------+------------------+-----+---------+-------+ PTA 204 1.13 triphasic  +--------+------------------+-----+---------+-------+ DP 194 1.07 biphasic   +--------+------------------+-----+---------+-------+ +-------+-----------+-----------+------------+------------+ ABI/TBIT oday's ABIT oday's TBIPrevious ABIPrevious TBI +-------+-----------+-----------+------------+------------+ Right 1.06 0.8 1.26 0.65  +-------+-----------+-----------+------------+------------+ Left 1.13 amputation 1.15 amputation  +-------+-----------+-----------+------------+------------+ Previous ABI on 08/06/20 at [facility] Pedal pressures falsely elevated due to medial calcification. Summary: Right: Resting right ankle-brachial index is within normal range. The right toe-brachial index is normal. Left: Resting left ankle-brachial index is within normal range. READMISSION 08/04/2021 Mr. Bookwalter is now a 66 year old man who I remember from this clinic many years ago I think he had a right lower extremity predominantly venous wound at the time. He was here for 1 visit in March of this year had wounds on his right second and third toes we apparently dressed them many and they healed so he did not come back. He is listed in [place] is being a diabetic although the patient denies this says he is verified it with his primary doctor. In any case over the last several weeks or so according the patient although these wounds look somewhat more chronic than that he has developed predominantly large wounds on the right medial and right lateral ankle smaller areas on the left leg and areas on the dorsal aspect of his right second and third toes. Its not clear how he has been dressing these. 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 The patient has known PAD. He had an angiogram in August 2022 at which time he had nonhealing wounds in both feet. On the left his major vessels in the thigh were all patent. He had three-vessel patent to the level of the ankle. He had a very short occlusion in the left anterior tibial. On the right lower extremity the major vessels in his thigh were all patent. He had three-vessel runoff to the foot sluggish filling of the anterior tibial artery. He was felt to have some component of small vessel disease but nothing that was amenable or needed revascularization. It was recommended that he have amputation of the second and third toes on the right foot if they did not heal He has been following with [name] of podiatry. [name] got him juxta lite stockings although I do not think he had them on properly he has uncontrolled edema in both legs Past medical history includes type 2 diabetes [although the patient really denies this], left TMA in 2022,lower extremity wounds in fact attendance at this clinic in 2009-2010, A-fib on Eliquis, chronic venous insufficiency with secondary lymphedema history of non-Hodgkin's lymphoma. 08-11-2021 upon evaluation today patient presents for follow-up evaluation he was seen last Wednesday initially for inspection here in our clinic. With that being said he tells me that he unfortunately has been having a lot of drainage and is actually coming through his wrap. Fortunately I do not see any evidence of active infection locally or systemically at this time which  is great news. No fevers, chills, nausea, vomiting, or diarrhea. With that being said there does appear to be some evidence of local infection based on what I am seeing today. 08-18-2021 upon evaluation today patient appears to be doing okay currently in regard to his wounds with that being said that he is doing much better but still has a long ways to go to get to where he wanted to be. I think the  infection is significantly improved. He has another week of the antibiotic at this point. Objective Constitutional Well-nourished and well-hydrated in no acute distress. Vitals Time Taken: 3:45 PM, Height: 71 in, Weight: 350 lbs, BMI: 48.8, Temperature: 97.7 F, Pulse: 78 bpm, Respiratory Rate: 18 breaths/min, Blood Pressure: 114/75 mmHg. Respiratory normal breathing without difficulty. Psychiatric this patient is able to make decisions and demonstrates good insight into disease process. Alert and Oriented x 3. pleasant and cooperative. General Notes: Patient's wounds currently are actually showing signs of significant improvement which is great news and overall very pleased in that regard. I do not see any evidence of active infection locally or systemically at this time which is great news and overall very pleased. Integumentary (Hair, Skin) Wound #10 status is Open. Original cause of wound was Gradually Appeared. The date acquired was: 07/16/2021. The wound has been in treatment 2 weeks. The wound is located on the Right T Third. The wound measures 0.4cm length x 0.3cm width x 0.1cm depth; 0.094cm^2 area and 0.009cm^3 volume. There is Fat oe Layer (Subcutaneous Tissue) exposed. There is a medium amount of serosanguineous drainage noted. The wound margin is distinct with the outline attached to the wound base. There is medium (34-66%) red granulation within the wound bed. There is a medium (34-66%) amount of necrotic tissue within the wound bed including Adherent Slough. Wound #11 status is Open. Original cause of wound was Gradually Appeared. The date acquired was: 07/22/2021. The wound has been in treatment 2 weeks. The wound is located on the Left,Medial Lower Leg. The wound measures 2.4cm length x 1.2cm width x 0.1cm depth; 2.262cm^2 area and 0.226cm^3 volume. There is Fat Layer (Subcutaneous Tissue) exposed. There is a large amount of serosanguineous drainage noted. Foul odor after  cleansing was noted. The wound margin is distinct with the outline attached to the wound base. There is medium (34-66%) red, pink granulation within the wound bed. There is a medium (34-66%) amount of necrotic tissue within the wound bed including Adherent Slough. Wound #12 status is Open. Original cause of wound was Gradually Appeared. The date acquired was: 07/15/2021. The wound has been in treatment 2 weeks. The wound is located on the Left,Anterior Lower Leg. The wound measures 4cm length x 5.7cm width x 0.2cm depth; 17.907cm^2 area and 3.581cm^3 volume. There is Fat Layer (Subcutaneous Tissue) exposed. There is a large amount of serosanguineous drainage noted. Foul odor after cleansing was noted. The wound margin is distinct with the outline attached to the wound base. There is small (1-33%) red granulation within the wound bed. There is a large (67-100%) amount of necrotic tissue within the wound bed including Adherent Slough. Wound #7 status is Open. Original cause of wound was Blister. The date acquired was: 07/21/2021. The wound has been in treatment 2 weeks. The wound is located on the Right,Medial Malleolus. The wound measures 6.5cm length x 9.5cm width x 0.5cm depth; 48.498cm^2 area and 24.249cm^3 volume. There is Fat Layer (Subcutaneous Tissue) exposed. There is no tunneling or undermining noted. There is a large  amount of serosanguineous drainage noted. Foul odor after cleansing was noted. The wound margin is distinct with the outline attached to the wound base. There is small (1-33%) pink granulation within the wound bed. There is a large (67-100%) amount of necrotic tissue within the wound bed including Eschar and Adherent Slough. Wound #8 status is Open. Original cause of wound was Gradually Appeared. The date acquired was: 07/22/2021. The wound has been in treatment 2 weeks. The wound is located on the Right,Lateral Malleolus. The wound measures 3.5cm length x 9.4cm width x 0.1cm depth;  25.84cm^2 area and 2.584cm^3 volume. There is Fat Layer (Subcutaneous Tissue) exposed. There is no tunneling or undermining noted. There is a large amount of serosanguineous drainage noted. Foul odor after cleansing was noted. The wound margin is distinct with the outline attached to the wound base. There is small (1-33%) red, pink granulation within the wound bed. There is a large (67-100%) amount of necrotic tissue within the wound bed including Adherent Slough. Wound #9 status is Open. Original cause of wound was Gradually Appeared. The date acquired was: 07/15/2021. The wound has been in treatment 2 weeks. The wound is located on the Right T Second. The wound measures 0.5cm length x 1cm width x 0.1cm depth; 0.393cm^2 area and 0.039cm^3 volume. There is Fat oe Layer (Subcutaneous Tissue) exposed. There is no tunneling or undermining noted. There is a medium amount of serosanguineous drainage noted. Foul odor after cleansing was noted. The wound margin is distinct with the outline attached to the wound base. There is large (67-100%) red granulation within the wound bed. There is a small (1-33%) amount of necrotic tissue within the wound bed including Adherent Slough. 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 There was a Three Layer oe Compression Therapy Procedure by Rhae Hammock, RN. Post procedure Diagnosis Wound #10: Same as  Pre-Procedure Wound #11 Pre-procedure diagnosis of Wound #11 is a Venous Leg Ulcer located on the Left,Medial Lower Leg . There was a Three Layer Compression Therapy Procedure by Rhae Hammock, RN. Post procedure Diagnosis Wound #11: Same as Pre-Procedure Wound #12 Pre-procedure diagnosis of Wound #12 is a Venous Leg Ulcer located on the Left,Anterior Lower Leg . There was a Three Layer Compression Therapy Procedure by Rhae Hammock, RN. Post procedure Diagnosis Wound #12: Same as Pre-Procedure Wound #7 Pre-procedure diagnosis of Wound #7 is a Venous Leg Ulcer located on the Right,Medial Malleolus . There was a Three Layer Compression Therapy Procedure by Rhae Hammock, RN. Post procedure Diagnosis Wound #7: Same as Pre-Procedure Wound #8 Pre-procedure diagnosis of Wound #8 is a Venous Leg Ulcer located on the Right,Lateral Malleolus . There was a Three Layer Compression Therapy Procedure by Rhae Hammock, RN. Post procedure Diagnosis Wound #8: Same as Pre-Procedure Wound #9 Pre-procedure diagnosis of Wound #9 is a Diabetic Wound/Ulcer of the Lower Extremity located on the Right T Second . There was a Three Layer oe Compression Therapy Procedure by Rhae Hammock, RN. Post procedure Diagnosis Wound #9: Same as Pre-Procedure  Plan Follow-up Appointments: Return Appointment in 1 week. - 08/25/21 @ 0800 w/ Jeri Cos and [place] Rm # 7 09/01/21 @ 3:00 w/ Jeri Cos and [place], [place] # 8 Nurse Visit: - Monday 08/23/21 @ 3:45 w/ [name] Rm # 8 Friday 09/03/21 @ 8:00 [place] Rm #6 Other: - Finish taking your antibiotics. We are sending your culture to [facility] so that a compounded antibiotic ointment will be made and sent to you via mail. [facility] wound care supplies for your toes. Bathing/ Shower/ Hygiene: May shower with protection but do not get wound dressing(s) wet. Edema Control - Lymphedema / SCD / Other: Elevate legs to the level of the heart or above for 30 minutes  daily and/or when sitting, a frequency of: - 3-4 times a day throughout the day. Avoid standing for long periods of time. Exercise regularly WOUND #10: - T Third Wound Laterality: Right oe Cleanser: Wound Cleanser Every Other Day/30 Days Discharge Instructions: Cleanse the wound with wound cleanser prior to applying a clean dressing using gauze sponges, not tissue or cotton balls. Topical: keystone Every Other Day/30 Days Discharge Instructions: Begin using topically to wounds once it gets sent to your house. Prim Dressing: KerraCel Ag Gelling Fiber Dressing, 4x5 in (silver alginate) Every Other Day/30 Days ary Discharge Instructions: Apply silver alginate to wound bed as instructed Secondary Dressing: Woven Gauze Sponges 2x2 in Every Other Day/30 Days Discharge Instructions: Apply over primary dressing as directed. Secured With: 67M Medipore Public affairs consultant Surgical T 2x10 (in/yd) Every Other Day/30 Days ape Discharge Instructions: Secure with tape as directed. WOUND #11: - Lower Leg Wound Laterality: Left, Medial Peri-Wound Care: Zinc Oxide Ointment 30g tube 1 x Per Week/30 Days Discharge Instructions: Apply Zinc Oxide to periwound with each dressing change Peri-Wound Care: Sween Lotion (Moisturizing lotion) 1 x Per Week/30 Days Discharge Instructions: Apply moisturizing lotion as directed Topical: Keystone 1 x Per Week/30 Days Prim Dressing: IODOFLEX 0.9% Cadexomer Iodine Pad 4x6 cm 1 x Per Week/30 Days ary Discharge Instructions: Apply to wound bed as instructed Secondary Dressing: ABD Pad, 8x10 1 x Per Week/30 Days Discharge Instructions: Apply over primary dressing as directed. Secondary Dressing: CarboFLEX Odor Control Dressing, 4x4 in 1 x Per Week/30 Days Discharge Instructions: Apply over primary dressing as directed. Secondary Dressing: Zetuvit Plus 4x8 in 1 x Per Week/30 Days Discharge Instructions: Apply over primary dressing as directed. Com pression Wrap: ThreePress (3 layer  compression wrap) 1 x Per Week/30 Days Discharge Instructions: Apply three layer compression as directed. WOUND #12: - Lower Leg Wound Laterality: Left, Anterior Peri-Wound Care: Zinc Oxide Ointment 30g tube 1 x Per Week/30 Days Discharge Instructions: Apply Zinc Oxide to periwound with each dressing change Peri-Wound Care: Sween Lotion (Moisturizing lotion) 1 x Per Week/30 Days Discharge Instructions: Apply moisturizing lotion as directed Topical: Keystone 1 x Per Week/30 Days Prim Dressing: IODOFLEX 0.9% Cadexomer Iodine Pad 4x6 cm 1 x Per Week/30 Days ary Discharge Instructions: Apply to wound bed as instructed Secondary Dressing: ABD Pad, 8x10 1 x Per Week/30 Days Discharge Instructions: Apply over primary dressing as directed. Secondary Dressing: CarboFLEX Odor Control Dressing, 4x4 in 1 x Per Week/30 Days Discharge Instructions: Apply over primary dressing as directed. Secondary Dressing: Zetuvit Plus 4x8 in 1 x Per Week/30 Days Discharge Instructions: Apply over primary dressing as directed. Com pression Wrap: ThreePress (3 layer compression wrap) 1 x Per Week/30 Days Discharge Instructions: Apply three layer compression as directed. WOUND #7: - Malleolus Wound Laterality: Right, Medial Peri-Wound Care:  Zinc Oxide Ointment 30g tube 1 x Per Week/30 Days Discharge Instructions: Apply Zinc Oxide to periwound with each dressing change Peri-Wound Care: Sween Lotion (Moisturizing lotion) 1 x Per Week/30 Days Discharge Instructions: Apply moisturizing lotion as directed Topical: Keystone 1 x Per Week/30 Days Prim Dressing: IODOFLEX 0.9% Cadexomer Iodine Pad 4x6 cm 1 x Per Week/30 Days ary Discharge Instructions: Apply to wound bed as instructed Secondary Dressing: ABD Pad, 8x10 1 x Per Week/30 Days Discharge Instructions: Apply over primary dressing as directed. Secondary Dressing: CarboFLEX Odor Control Dressing, 4x4 in 1 x Per Week/30 Days Discharge Instructions: Apply over primary  dressing as directed. Secondary Dressing: Zetuvit Plus 4x8 in 1 x Per Week/30 Days Discharge Instructions: Apply over primary dressing as directed. Com pression Wrap: ThreePress (3 layer compression wrap) 1 x Per Week/30 Days Discharge Instructions: Apply three layer compression as directed. WOUND #8: - Malleolus Wound Laterality: Right, Lateral Peri-Wound Care: Zinc Oxide Ointment 30g tube 1 x Per Week/30 Days Discharge Instructions: Apply Zinc Oxide to periwound with each dressing change Peri-Wound Care: Sween Lotion (Moisturizing lotion) 1 x Per Week/30 Days Discharge Instructions: Apply moisturizing lotion as directed Topical: Keystone 1 x Per Week/30 Days Prim Dressing: IODOFLEX 0.9% Cadexomer Iodine Pad 4x6 cm 1 x Per Week/30 Days ary Discharge Instructions: Apply to wound bed as instructed Secondary Dressing: ABD Pad, 8x10 1 x Per Week/30 Days Discharge Instructions: Apply over primary dressing as directed. Secondary Dressing: CarboFLEX Odor Control Dressing, 4x4 in 1 x Per Week/30 Days Discharge Instructions: Apply over primary dressing as directed. Secondary Dressing: Zetuvit Plus 4x8 in 1 x Per Week/30 Days Discharge Instructions: Apply over primary dressing as directed. Com pression Wrap: ThreePress (3 layer compression wrap) 1 x Per Week/30 Days Discharge Instructions: Apply three layer compression as directed. WOUND #9: - T Second Wound Laterality: Right oe Cleanser: Wound Cleanser Every Other Day/30 Days Discharge Instructions: Cleanse the wound with wound cleanser prior to applying a clean dressing using gauze sponges, not tissue or cotton balls. Topical: keystone Every Other Day/30 Days Discharge Instructions: Begin using topically to wounds once it gets sent to your house. Prim Dressing: KerraCel Ag Gelling Fiber Dressing, 4x5 in (silver alginate) Every Other Day/30 Days ary Discharge Instructions: Apply silver alginate to wound bed as instructed Secondary Dressing:  Woven Gauze Sponges 2x2 in Every Other Day/30 Days Discharge Instructions: Apply over primary dressing as directed. Secured With: 73M Medipore Public affairs consultant Surgical T 2x10 (in/yd) Every Other Day/30 Days ape Discharge Instructions: Secure with tape as directed. 1. I am good recommend that we go ahead and continue with the recommendation for wound care measures as before and the patient is in agreement with plan this includes the use of the Iodoflex which I think is doing well on the wounds. 2. I am also can recommend the CarboFlex to cover followed by Zetuvit, 3. I am also can recommend the continuation of the 3 layer compression wraps which I feel like is doing a good job keeping edema under control. We will see patient back for reevaluation in 1 week here in the clinic. If anything worsens or changes patient will contact our office for additional recommendations. Electronic Signature(s) Signed: 08/20/2021 5:38:00 PM By: [name] PA-C Entered By: [name] on 08/20/2021 17:38:00 -------------------------------------------------------------------------------- SuperBill Details Patient Name: Date of Service: [name], [name] 08/18/2021 Medical Record Number: [id number] Patient Account Number: [id number] Date of Birth/Sex: Treating RN: 01-31-1955 (66 y.o. 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 Modifier: Quantity: 1 Physician Procedures : CPT4 Code Description Modifier [id number] - WC PHYS LEVEL 4 - EST PT ICD-10 Diagnosis Description I89.0 Lymphedema, not elsewhere classified I87.333 Chronic venous hypertension (idiopathic) with ulcer and inflammation of bilateral lower extremity  L97.828 Non-pressure chronic ulcer of other part of left lower leg with other specified severity L97.818 Non-pressure chronic ulcer of other part of right lower leg with other specified severity Quantity: 1 Electronic Signature(s) Signed: 08/20/2021 5:40:48 PM By: [name] PA-C Previous Signature: 08/19/2021 3:04:24 PM Version By: [name] PA-C Previous Signature: 08/19/2021 5:01:19 PM Version By: Rhae Hammock RN Entered By: [name] on 08/20/2021 17:40:47

## 2021-08-19 NOTE — Progress Notes (Signed)
ROGELIO WAYNICK ([id number]) , Visit Report for 08/18/2021 Arrival Information Details Patient Name: Date of Service: RYDEN, WAINER 08/18/2021 3:15 PM Medical Record Number: [id number] Patient Account Number: [id number] Date of Birth/Sex: Treating RN: 05/11/1955 (66 y.o. [name], [name] Primary Care Khalaya Mcgurn: [name] Other Clinician: Referring Ramiz Turpin: Treating Dominico Rod/Extender: [name] in Treatment: 2 Visit Information History Since Last Visit Added or deleted any medications: No Patient Arrived: Wheel Chair Any new allergies or adverse reactions: No Arrival Time: 15:45 Had a fall or experienced change in No Accompanied By: wife activities of daily living that may affect Transfer Assistance: None risk of falls: Patient Identification Verified: Yes Signs or symptoms of abuse/neglect since last visito No Secondary Verification Process Completed: Yes Hospitalized since last visit: No Patient Requires Transmission-Based Precautions: No Implantable device outside of the clinic excluding No Patient Has Alerts: Yes cellular tissue based products placed in the center Patient Alerts: Patient on Blood Thinner since last visit: 01/2021 ABI L 1.13 R 1.06 Has Dressing in Place as Prescribed: Yes 01/2021 TBI L amp R 0.8 Has Compression in Place as Prescribed: Yes Pain Present Now: No Electronic Signature(s) Signed: 08/19/2021 4:44:02 PM By: Erenest Blank Entered By: Erenest Blank on 08/18/2021 15:45:41 -------------------------------------------------------------------------------- Compression Therapy Details Patient Name: Date of Service: [name] 08/18/2021 3:15 PM Medical Record Number: [id number] Patient Account Number: [id number] Date of Birth/Sex: Treating RN: [birth year] (66 y.o. [name] Noe Primary Care Keyatta Tolles: [name] Other Clinician: Referring Patria Warzecha: Treating Roselee Tayloe/Extender: [name]  in Treatment: 2 Compression Therapy Performed for Wound Assessment: Wound #8 Right,Lateral Malleolus Performed By: Clinician Rhae Hammock, RN Compression Type: Three Layer Post Procedure Diagnosis Same as Pre-procedure Electronic Signature(s) Signed: 08/19/2021 5:01:19 PM By: Rhae Hammock RN Entered By: Rhae Hammock on 08/18/2021 17:07:50 -------------------------------------------------------------------------------- Compression Therapy Details Patient Name: Date of Service: Arrants, [name] 08/18/2021 3:15 PM Medical Record Number: [id number] Patient Account Number: [id number] Date of Birth/Sex: Treating RN: [birth year] (66 y.o. [name] Noe Primary Care Akiel Fennell: [name] Other Clinician: Referring Aydia Maj: Treating Yoshiye Kraft/Extender: [name] in Treatment: 2 Compression Therapy Performed for Wound Assessment: Wound #10 Right T Third oe Performed By: Clinician Rhae Hammock, RN Compression Type: Three Layer Post Procedure Diagnosis Same as Pre-procedure Electronic Signature(s) Signed: 08/19/2021 5:01:19 PM By: Rhae Hammock RN Entered By: Rhae Hammock on 08/18/2021 17:07:50 -------------------------------------------------------------------------------- Compression Therapy Details Patient Name: Date of Service: Macmillan, [name] 08/18/2021 3:15 PM Medical Record Number: [id number] Patient Account Number: [id number] Date of Birth/Sex: Treating RN: 08-16-1955 (66 y.o. [name] Noe Primary Care Kamali Nephew: [name] Other Clinician: Referring Kaylem Gidney: Treating Nahla Lukin/Extender: [name] in Treatment: 2 Compression Therapy Performed for Wound Assessment: Wound #11 Left,Medial Lower Leg Performed By: Clinician Rhae Hammock, RN Compression Type: Three Layer Post Procedure Diagnosis Same as Pre-procedure Electronic Signature(s) Signed: 08/19/2021 5:01:19 PM By:  Rhae Hammock RN Entered By: Rhae Hammock on 08/18/2021 17:07:51 -------------------------------------------------------------------------------- Compression Therapy Details Patient Name: Date of Service: Caver, [name] 08/18/2021 3:15 PM Medical Record Number: [id number] Patient Account Number: [id number] Date of Birth/Sex: Treating RN: 27-Feb-1955 (66 y.o. [name] Noe Primary Care Simone Tuckey: [name] Other Clinician: Referring Sindhu Nguyen: Treating Guhan Bruington/Extender: [name] in Treatment: 2 Compression Therapy Performed for Wound Assessment: Wound #12 Left,Anterior Lower Leg Performed By: Clinician Rhae Hammock, RN Compression Type: Three Layer Post Procedure Diagnosis Same as Pre-procedure Electronic Signature(s) Signed:  08/19/2021 5:01:19 PM By: Rhae Hammock RN Entered By: Rhae Hammock on 08/18/2021 17:07:51 -------------------------------------------------------------------------------- Compression Therapy Details Patient Name: Date of Service: Paget, [name] 08/18/2021 3:15 PM Medical Record Number: [id number] Patient Account Number: [id number] Date of Birth/Sex: Treating RN: Jan 17, 1955 (66 y.o. [name] Noe Primary Care Graysen Depaula: [name] Other Clinician: Referring Celsa Nordahl: Treating Wei Poplaski/Extender: [name] in Treatment: 2 Compression Therapy Performed for Wound Assessment: Wound #7 Right,Medial Malleolus Performed By: Clinician Rhae Hammock, RN Compression Type: Three Layer Post Procedure Diagnosis Same as Pre-procedure Electronic Signature(s) Signed: 08/19/2021 5:01:19 PM By: Rhae Hammock RN Entered By: Rhae Hammock on 08/18/2021 17:07:51 -------------------------------------------------------------------------------- Compression Therapy Details Patient Name: Date of Service: Pakula, [name] 08/18/2021 3:15 PM Medical Record Number:  [id number] Patient Account Number: [id number] Date of Birth/Sex: Treating RN: 02-Oct-1955 (66 y.o. [name] Noe Primary Care Johnrobert Foti: [name] Other Clinician: Referring Pierre Cumpton: Treating Dayn Barich/Extender: [name] in Treatment: 2 Compression Therapy Performed for Wound Assessment: Wound #9 Right T Second oe Performed By: Clinician Rhae Hammock, RN Compression Type: Three Layer Post Procedure Diagnosis Same as Pre-procedure Electronic Signature(s) Signed: 08/19/2021 5:01:19 PM By: Rhae Hammock RN Entered By: Rhae Hammock on 08/18/2021 17:07:51 -------------------------------------------------------------------------------- Encounter Discharge Information Details Patient Name: Date of Service: [place], [name] 08/18/2021 3:15 PM Medical Record Number: [id number] Patient Account Number: [id number] Date of Birth/Sex: Treating RN: Dec 13, 1955 (66 y.o. [name], [name] Primary Care Abdel Effinger: Other Clinician: [name] Referring Kahlia Lagunes: Treating Nicolas Sisler/Extender: [name] in Treatment: 2 Encounter Discharge Information Items Discharge Condition: Stable Ambulatory Status: Ambulatory Discharge Destination: Home Transportation: Private Auto Accompanied By: self Schedule Follow-up Appointment: Yes Clinical Summary of Care: Patient Declined Electronic Signature(s) Signed: 08/19/2021 5:01:19 PM By: Rhae Hammock RN Entered By: Rhae Hammock on 08/18/2021 17:29:10 -------------------------------------------------------------------------------- Lower Extremity Assessment Details Patient Name: Date of Service: Ginley, [name] 08/18/2021 3:15 PM Medical Record Number: [id number] Patient Account Number: [id number] Date of Birth/Sex: Treating RN: 28-Jul-1955 (66 y.o. [name], [name] Primary Care Gracelyn Coventry: [name] Other Clinician: Referring Thoren Hosang: Treating Ermalee Mealy/Extender:  [name] in Treatment: 2 Edema Assessment Assessed: [name]: No] [name]: No] Edema: [Left: Yes] [Right: Yes] Calf Left: Right: Point of Measurement: 31 cm From Medial Instep 49 cm 47.8 cm Ankle Left: Right: Point of Measurement: 11 cm From Medial Instep 29 cm 27.5 cm Electronic Signature(s) Signed: 08/19/2021 4:44:02 PM By: Erenest Blank Signed: 08/19/2021 5:01:19 PM By: Rhae Hammock RN Entered By: Erenest Blank on 08/18/2021 16:06:06 -------------------------------------------------------------------------------- Multi-Disciplinary Care Plan Details Patient Name: Date of Service: [name] 08/18/2021 3:15 PM Medical Record Number: [id number] Patient Account Number: [id number] Date of Birth/Sex: Treating RN: 06/02/1955 (66 y.o. [name], [name] Primary Care Kaytlan Behrman: [name] Other Clinician: Referring Juell Radney: Treating Jasemine Nawaz/Extender: [name] in Treatment: 2 Multidisciplinary Care Plan reviewed with physician Active Inactive Pain, Acute or Chronic Nursing Diagnoses: Pain, acute or chronic: actual or potential Potential alteration in comfort, pain Goals: Patient will verbalize adequate pain control and receive pain control interventions during procedures as needed Date Initiated: 08/04/2021 Target Resolution Date: 09/10/2021 Goal Status: Active Patient/caregiver will verbalize comfort level met Date Initiated: 08/04/2021 Target Resolution Date: 09/08/2021 Goal Status: Active Interventions: Encourage patient to take pain medications as prescribed Provide education on pain management Reposition patient for comfort Treatment Activities: Administer pain control measures as ordered : 08/04/2021 Notes: Venous Leg Ulcer Nursing Diagnoses: Knowledge deficit related to  disease process and management Goals: Non-invasive venous studies are completed as ordered Date Initiated: 08/04/2021 Target Resolution  Date: 08/13/2021 Goal Status: Active Patient/caregiver will verbalize understanding of disease process and disease management Date Initiated: 08/04/2021 Target Resolution Date: 09/16/2021 Goal Status: Active Interventions: Assess peripheral edema status every visit. Compression as ordered Treatment Activities: Non-invasive vascular studies : 08/04/2021 Notes: Wound/Skin Impairment Nursing Diagnoses: Knowledge deficit related to ulceration/compromised skin integrity Goals: Patient/caregiver will verbalize understanding of skin care regimen Date Initiated: 08/04/2021 Target Resolution Date: 08/13/2021 Goal Status: Active Interventions: Assess patient/caregiver ability to perform ulcer/skin care regimen upon admission and as needed Assess ulceration(s) every visit Provide education on ulcer and skin care Treatment Activities: Skin care regimen initiated : 08/04/2021 Topical wound management initiated : 08/04/2021 Notes: Electronic Signature(s) Signed: 08/19/2021 5:01:19 PM By: Rhae Hammock RN Entered By: Rhae Hammock on 08/18/2021 17:04:50 -------------------------------------------------------------------------------- Pain Assessment Details Patient Name: Date of Service: Laconte, [name] 08/18/2021 3:15 PM Medical Record Number: [id number] Patient Account Number: [id number] Date of Birth/Sex: Treating RN: 1955-07-14 (66 y.o. [name], [name] Primary Care Oceana Walthall: [name] Other Clinician: Referring Trystan Akhtar: Treating Jason Hauge/Extender: [name] in Treatment: 2 Active Problems Location of Pain Severity and Description of Pain Patient Has Paino No Site Locations Pain Management and Medication Current Pain Management: Electronic Signature(s) Signed: 08/19/2021 4:44:02 PM By: Erenest Blank Signed: 08/19/2021 5:01:19 PM By: Rhae Hammock RN Entered By: Erenest Blank on 08/18/2021  15:48:19 -------------------------------------------------------------------------------- Patient/Caregiver Education Details Patient Name: Date of Service: [name] 8/9/2023andnbsp3:15 PM Medical Record Number: [id number] Patient Account Number: [id number] Date of Birth/Gender: Treating RN: 1955/06/16 (66 y.o. [name] Noe Primary Care Physician: [name] Other Clinician: Referring Physician: Treating Physician/Extender: [name] in Treatment: 2 Education Assessment Education Provided To: Patient Education Topics Provided Pain: Methods: Explain/Verbal Responses: State content correctly Wound/Skin Impairment: Methods: Explain/Verbal Responses: Reinforcements needed, State content correctly Electronic Signature(s) Signed: 08/19/2021 5:01:19 PM By: Rhae Hammock RN Entered By: Rhae Hammock on 08/18/2021 17:05:08 -------------------------------------------------------------------------------- Wound Assessment Details Patient Name: Date of Service: Diiorio, [name] 08/18/2021 3:15 PM Medical Record Number: [id number] Patient Account Number: [id number] Date of Birth/Sex: Treating RN: 06-24-1955 (66 y.o. [name], [name] Primary Care Mennie Spiller: [name] Other Clinician: Referring Kion Huntsberry: Treating Guage Efferson/Extender: [name] in Treatment: 2 Wound Status Wound Number: 10 Primary Diabetic Wound/Ulcer of the Lower Extremity Etiology: Wound Location: Right T Third oe Wound Open Wounding Event: Gradually Appeared Status: Date Acquired: 07/16/2021 Comorbid Sleep Apnea, Arrhythmia, Congestive Heart Failure, Hypertension, Weeks Of Treatment: 2 History: Hypotension, Peripheral Arterial Disease, Peripheral Venous Clustered Wound: No Disease, Type II Diabetes, Gout, Osteoarthritis Photos Wound Measurements Length: (cm) 0.4 Width: (cm) 0.3 Depth: (cm) 0.1 Area: (cm) 0.094 Volume: (cm)  0.009 % Reduction in Area: 65.8% % Reduction in Volume: 66.7% Epithelialization: Small (1-33%) Wound Description Classification: Grade 2 Wound Margin: Distinct, outline attached Exudate Amount: Medium Exudate Type: Serosanguineous Exudate Color: red, brown Foul Odor After Cleansing: No Slough/Fibrino Yes Wound Bed Granulation Amount: Medium (34-66%) Exposed Structure Granulation Quality: Red Fascia Exposed: No Necrotic Amount: Medium (34-66%) Fat Layer (Subcutaneous Tissue) Exposed: Yes Necrotic Quality: Adherent Slough Tendon Exposed: No Muscle Exposed: No Joint Exposed: No Bone Exposed: No Treatment Notes Wound #10 (Toe Third) Wound Laterality: Right Cleanser Wound Cleanser Discharge Instruction: Cleanse the wound with wound cleanser prior to applying a clean dressing using gauze sponges, not tissue or cotton balls. Peri-Wound Care Topical keystone Discharge Instruction: Begin  using topically to wounds once it gets sent to your house. Primary Dressing KerraCel Ag Gelling Fiber Dressing, 4x5 in (silver alginate) Discharge Instruction: Apply silver alginate to wound bed as instructed Secondary Dressing Woven Gauze Sponges 2x2 in Discharge Instruction: Apply over primary dressing as directed. Secured With [employer] Surgical T 2x10 (in/yd) ape Discharge Instruction: Secure with tape as directed. Compression Wrap Compression Stockings Add-Ons Electronic Signature(s) Signed: 08/19/2021 4:44:02 PM By: Erenest Blank Signed: 08/19/2021 5:01:19 PM By: Rhae Hammock RN Entered By: Erenest Blank on 08/18/2021 16:08:09 -------------------------------------------------------------------------------- Wound Assessment Details Patient Name: Date of Service: [place], [name] 08/18/2021 3:15 PM Medical Record Number: [id number] Patient Account Number: [id number] Date of Birth/Sex: Treating RN: 1955/02/07 (66 y.o. [name], [name] Primary Care Jaielle Dlouhy: [name] Other Clinician: Referring Bunyan Brier: Treating Owen Pratte/Extender: [name] in Treatment: 2 Wound Status Wound Number: 11 Primary Venous Leg Ulcer Etiology: Wound Location: Left, Medial Lower Leg Secondary Lymphedema Wounding Event: Gradually Appeared Etiology: Date Acquired: 07/22/2021 Wound Open Weeks Of Treatment: 2 Status: Clustered Wound: No Comorbid Sleep Apnea, Arrhythmia, Congestive Heart Failure, History: Hypertension, Hypotension, Peripheral Arterial Disease, Peripheral Venous Disease, Type II Diabetes, Gout, Osteoarthritis Photos Wound Measurements Length: (cm) 2.4 Width: (cm) 1.2 Depth: (cm) 0.1 Area: (cm) 2.262 Volume: (cm) 0.226 % Reduction in Area: 58.9% % Reduction in Volume: 58.9% Epithelialization: Small (1-33%) Wound Description Classification: Full Thickness Without Exposed Support Structures Wound Margin: Distinct, outline attached Exudate Amount: Large Exudate Type: Serosanguineous Exudate Color: red, brown Foul Odor After Cleansing: Yes Due to Product Use: No Slough/Fibrino Yes Wound Bed Granulation Amount: Medium (34-66%) Exposed Structure Granulation Quality: Red, Pink Fascia Exposed: No Necrotic Amount: Medium (34-66%) Fat Layer (Subcutaneous Tissue) Exposed: Yes Necrotic Quality: Adherent Slough Tendon Exposed: No Muscle Exposed: No Joint Exposed: No Bone Exposed: No Treatment Notes Wound #11 (Lower Leg) Wound Laterality: Left, Medial Cleanser Peri-Wound Care Zinc Oxide Ointment 30g tube Discharge Instruction: Apply Zinc Oxide to periwound with each dressing change Sween Lotion (Moisturizing lotion) Discharge Instruction: Apply moisturizing lotion as directed Topical Keystone Primary Dressing IODOFLEX 0.9% Cadexomer Iodine Pad 4x6 cm Discharge Instruction: Apply to wound bed as instructed Secondary Dressing ABD Pad, 8x10 Discharge Instruction: Apply over primary dressing as  directed. CarboFLEX Odor Control Dressing, 4x4 in Discharge Instruction: Apply over primary dressing as directed. Zetuvit Plus 4x8 in Discharge Instruction: Apply over primary dressing as directed. Secured With Compression Wrap ThreePress (3 layer compression wrap) Discharge Instruction: Apply three layer compression as directed. Compression Stockings Add-Ons Electronic Signature(s) Signed: 08/19/2021 4:44:02 PM By: Erenest Blank Signed: 08/19/2021 5:01:19 PM By: Rhae Hammock RN Entered By: Erenest Blank on 08/18/2021 16:09:55 -------------------------------------------------------------------------------- Wound Assessment Details Patient Name: Date of Service: Schuessler, [name] 08/18/2021 3:15 PM Medical Record Number: [id number] Patient Account Number: [id number] Date of Birth/Sex: Treating RN: [birth year] (66 y.o. [name], [name] Primary Care Gerrick Ray: [name] Other Clinician: Referring Chenae Brager: Treating Lavon Bothwell/Extender: [name] in Treatment: 2 Wound Status Wound Number: 12 Primary Venous Leg Ulcer Etiology: Wound Location: Left, Anterior Lower Leg Secondary Lymphedema Wounding Event: Gradually Appeared Etiology: Date Acquired: 07/15/2021 Wound Open Weeks Of Treatment: 2 Status: Clustered Wound: No Comorbid Sleep Apnea, Arrhythmia, Congestive Heart Failure, History: Hypertension, Hypotension, Peripheral Arterial Disease, Peripheral Venous Disease, Type II Diabetes, Gout, Osteoarthritis Photos Wound Measurements Length: (cm) 4 Width: (cm) 5.7 Depth: (cm) 0.2 Area: (cm) 17.907 Volume: (cm) 3.581 % Reduction in Area: -31% % Reduction in Volume: -162% Epithelialization: Small (  1-33%) Wound Description Classification: Full Thickness Without Exposed Support Structures Wound Margin: Distinct, outline attached Exudate Amount: Large Exudate Type: Serosanguineous Exudate Color: red, brown Foul Odor After Cleansing: Yes Due  to Product Use: No Slough/Fibrino Yes Wound Bed Granulation Amount: Small (1-33%) Exposed Structure Granulation Quality: Red Fascia Exposed: No Necrotic Amount: Large (67-100%) Fat Layer (Subcutaneous Tissue) Exposed: Yes Necrotic Quality: Adherent Slough Tendon Exposed: No Muscle Exposed: No Joint Exposed: No Bone Exposed: No Treatment Notes Wound #12 (Lower Leg) Wound Laterality: Left, Anterior Cleanser Peri-Wound Care Zinc Oxide Ointment 30g tube Discharge Instruction: Apply Zinc Oxide to periwound with each dressing change Sween Lotion (Moisturizing lotion) Discharge Instruction: Apply moisturizing lotion as directed Topical Keystone Primary Dressing IODOFLEX 0.9% Cadexomer Iodine Pad 4x6 cm Discharge Instruction: Apply to wound bed as instructed Secondary Dressing ABD Pad, 8x10 Discharge Instruction: Apply over primary dressing as directed. CarboFLEX Odor Control Dressing, 4x4 in Discharge Instruction: Apply over primary dressing as directed. Zetuvit Plus 4x8 in Discharge Instruction: Apply over primary dressing as directed. Secured With Compression Wrap ThreePress (3 layer compression wrap) Discharge Instruction: Apply three layer compression as directed. Compression Stockings Add-Ons Electronic Signature(s) Signed: 08/19/2021 4:44:02 PM By: Erenest Blank Signed: 08/19/2021 5:01:19 PM By: Rhae Hammock RN Entered By: Erenest Blank on 08/18/2021 16:09:39 -------------------------------------------------------------------------------- Wound Assessment Details Patient Name: Date of Service: Pamer, [name] 08/18/2021 3:15 PM Medical Record Number: [id number] Patient Account Number: [id number] Date of Birth/Sex: Treating RN: 01/07/1956 (66 y.o. [name], [name] Primary Care Tudor Chandley: [name] Other Clinician: Referring Indi Willhite: Treating Shree Espey/Extender: [name] in Treatment: 2 Wound Status Wound Number: 7 Primary  Venous Leg Ulcer Etiology: Wound Location: Right, Medial Malleolus Secondary Lymphedema Wounding Event: Blister Etiology: Date Acquired: 07/21/2021 Wound Open Weeks Of Treatment: 2 Status: Clustered Wound: No Comorbid Sleep Apnea, Arrhythmia, Congestive Heart Failure, History: Hypertension, Hypotension, Peripheral Arterial Disease, Peripheral Venous Disease, Type II Diabetes, Gout, Osteoarthritis Photos Wound Measurements Length: (cm) 6.5 Width: (cm) 9.5 Depth: (cm) 0.5 Area: (cm) 48.498 Volume: (cm) 24.249 % Reduction in Area: -26.7% % Reduction in Volume: 9.5% Epithelialization: None Tunneling: No Undermining: No Wound Description Classification: Full Thickness With Exposed Support Struct Wound Margin: Distinct, outline attached Exudate Amount: Large Exudate Type: Serosanguineous Exudate Color: red, brown ures Foul Odor After Cleansing: Yes Due to Product Use: No Slough/Fibrino Yes Wound Bed Granulation Amount: Small (1-33%) Exposed Structure Granulation Quality: Pink Fascia Exposed: No Necrotic Amount: Large (67-100%) Fat Layer (Subcutaneous Tissue) Exposed: Yes Necrotic Quality: Eschar, Adherent Slough Tendon Exposed: No Muscle Exposed: No Joint Exposed: No Bone Exposed: No Treatment Notes Wound #7 (Malleolus) Wound Laterality: Right, Medial Cleanser Peri-Wound Care Zinc Oxide Ointment 30g tube Discharge Instruction: Apply Zinc Oxide to periwound with each dressing change Sween Lotion (Moisturizing lotion) Discharge Instruction: Apply moisturizing lotion as directed Topical Keystone Primary Dressing IODOFLEX 0.9% Cadexomer Iodine Pad 4x6 cm Discharge Instruction: Apply to wound bed as instructed Secondary Dressing ABD Pad, 8x10 Discharge Instruction: Apply over primary dressing as directed. CarboFLEX Odor Control Dressing, 4x4 in Discharge Instruction: Apply over primary dressing as directed. Zetuvit Plus 4x8 in Discharge Instruction: Apply over  primary dressing as directed. Secured With Compression Wrap ThreePress (3 layer compression wrap) Discharge Instruction: Apply three layer compression as directed. Compression Stockings Add-Ons Electronic Signature(s) Signed: 08/19/2021 4:44:02 PM By: Erenest Blank Signed: 08/19/2021 5:01:19 PM By: Rhae Hammock RN Entered By: Erenest Blank on 08/18/2021 16:08:34 -------------------------------------------------------------------------------- Wound Assessment Details Patient Name: Date of Service: Blagg, [name] 08/18/2021 3:15  PM Medical Record Number: [id number] Patient Account Number: [id number] Date of Birth/Sex: Treating RN: 1955-09-05 (66 y.o. [name], [name] Primary Care Keta [place]: [name] Other Clinician: Referring Deran Barro: Treating Simara Rhyner/Extender: [name] in Treatment: 2 Wound Status Wound Number: 8 Primary Venous Leg Ulcer Etiology: Wound Location: Right, Lateral Malleolus Secondary Lymphedema Wounding Event: Gradually Appeared Etiology: Date Acquired: 07/22/2021 Wound Open Weeks Of Treatment: 2 Status: Clustered Wound: Yes Comorbid Sleep Apnea, Arrhythmia, Congestive Heart Failure, History: Hypertension, Hypotension, Peripheral Arterial Disease, Peripheral Venous Disease, Type II Diabetes, Gout, Osteoarthritis Photos Wound Measurements Length: (cm) 3.5 Width: (cm) 9.4 Depth: (cm) 0.1 Clustered Quantity: 2 Area: (cm) 25.84 Volume: (cm) 2.584 % Reduction in Area: -11.9% % Reduction in Volume: 44% Epithelialization: Small (1-33%) Tunneling: No Undermining: No Wound Description Classification: Full Thickness Without Exposed Support Structures Wound Margin: Distinct, outline attached Exudate Amount: Large Exudate Type: Serosanguineous Exudate Color: red, brown Foul Odor After Cleansing: Yes Due to Product Use: No Slough/Fibrino Yes Wound Bed Granulation Amount: Small (1-33%) Exposed  Structure Granulation Quality: Red, Pink Fascia Exposed: No Necrotic Amount: Large (67-100%) Fat Layer (Subcutaneous Tissue) Exposed: Yes Necrotic Quality: Adherent Slough Tendon Exposed: No Muscle Exposed: No Joint Exposed: No Bone Exposed: No Treatment Notes Wound #8 (Malleolus) Wound Laterality: Right, Lateral Cleanser Peri-Wound Care Zinc Oxide Ointment 30g tube Discharge Instruction: Apply Zinc Oxide to periwound with each dressing change Sween Lotion (Moisturizing lotion) Discharge Instruction: Apply moisturizing lotion as directed Topical Keystone Primary Dressing IODOFLEX 0.9% Cadexomer Iodine Pad 4x6 cm Discharge Instruction: Apply to wound bed as instructed Secondary Dressing ABD Pad, 8x10 Discharge Instruction: Apply over primary dressing as directed. CarboFLEX Odor Control Dressing, 4x4 in Discharge Instruction: Apply over primary dressing as directed. Zetuvit Plus 4x8 in Discharge Instruction: Apply over primary dressing as directed. Secured With Compression Wrap ThreePress (3 layer compression wrap) Discharge Instruction: Apply three layer compression as directed. Compression Stockings Add-Ons Electronic Signature(s) Signed: 08/19/2021 4:44:02 PM By: Erenest Blank Signed: 08/19/2021 5:01:19 PM By: Rhae Hammock RN Entered By: Erenest Blank on 08/18/2021 16:09:02 -------------------------------------------------------------------------------- Wound Assessment Details Patient Name: Date of Service: Flud, [name] 08/18/2021 3:15 PM Medical Record Number: [id number] Patient Account Number: [id number] Date of Birth/Sex: Treating RN: 09/25/1955 (66 y.o. [name], [name] Primary Care Rocquel Askren: [name] Other Clinician: Referring Agustina Witzke: Treating Mickelle Goupil/Extender: [name] in Treatment: 2 Wound Status Wound Number: 9 Primary Diabetic Wound/Ulcer of the Lower Extremity Etiology: Wound Location: Right T  Second oe Wound Open Wounding Event: Gradually Appeared Status: Date Acquired: 07/15/2021 Comorbid Sleep Apnea, Arrhythmia, Congestive Heart Failure, Hypertension, Weeks Of Treatment: 2 History: Hypotension, Peripheral Arterial Disease, Peripheral Venous Clustered Wound: No Disease, Type II Diabetes, Gout, Osteoarthritis Photos Wound Measurements Length: (cm) 0.5 Width: (cm) 1 Depth: (cm) 0.1 Area: (cm) 0.393 Volume: (cm) 0.039 % Reduction in Area: 40.5% % Reduction in Volume: 40.9% Epithelialization: Small (1-33%) Tunneling: No Undermining: No Wound Description Classification: Grade 2 Wound Margin: Distinct, outline attached Exudate Amount: Medium Exudate Type: Serosanguineous Exudate Color: red, brown Foul Odor After Cleansing: Yes Due to Product Use: No Slough/Fibrino Yes Wound Bed Granulation Amount: Large (67-100%) Exposed Structure Granulation Quality: Red Fascia Exposed: No Necrotic Amount: Small (1-33%) Fat Layer (Subcutaneous Tissue) Exposed: Yes Necrotic Quality: Adherent Slough Tendon Exposed: No Muscle Exposed: No Joint Exposed: No Bone Exposed: No Treatment Notes Wound #9 (Toe Second) Wound Laterality: Right Cleanser Wound Cleanser Discharge Instruction: Cleanse the wound with wound cleanser prior to applying a clean dressing using  gauze sponges, not tissue or cotton balls. Peri-Wound Care Topical keystone Discharge Instruction: Begin using topically to wounds once it gets sent to your house. Primary Dressing KerraCel Ag Gelling Fiber Dressing, 4x5 in (silver alginate) Discharge Instruction: Apply silver alginate to wound bed as instructed Secondary Dressing Woven Gauze Sponges 2x2 in Discharge Instruction: Apply over primary dressing as directed. Secured With [employer] Surgical T 2x10 (in/yd) ape Discharge Instruction: Secure with tape as directed. Compression Wrap Compression Stockings Add-Ons Electronic  Signature(s) Signed: 08/19/2021 4:44:02 PM By: Erenest Blank Signed: 08/19/2021 5:01:19 PM By: Rhae Hammock RN Entered By: Erenest Blank on 08/18/2021 16:09:22 -------------------------------------------------------------------------------- Vitals Details Patient Name: Date of Service: Crandle, [name] 08/18/2021 3:15 PM Medical Record Number: [id number] Patient Account Number: [id number] Date of Birth/Sex: Treating RN: [birth year] (66 y.o. [name], [name] Primary Care Nobie Alleyne: [name] Other Clinician: Referring Kalie Cabral: Treating Belmira Daley/Extender: [name] in Treatment: 2 Vital Signs Time Taken: 15:45 Temperature (F): 97.7 Height (in): 71 Pulse (bpm): 78 Weight (lbs): 350 Respiratory Rate (breaths/min): 18 Body Mass Index (BMI): 48.8 Blood Pressure (mmHg): 114/75 Reference Range: 80 - 120 mg / dl Electronic Signature(s) Signed: 08/19/2021 4:44:02 PM By: Erenest Blank Entered By: Erenest Blank on 08/18/2021 15:48:12

## 2021-08-23 ENCOUNTER — Encounter (HOSPITAL_BASED_OUTPATIENT_CLINIC_OR_DEPARTMENT_OTHER): Payer: Medicare Other | Admitting: Internal Medicine

## 2021-08-23 ENCOUNTER — Ambulatory Visit (INDEPENDENT_AMBULATORY_CARE_PROVIDER_SITE_OTHER): Payer: Medicare Other

## 2021-08-23 DIAGNOSIS — I4439 Other atrioventricular block: Secondary | ICD-10-CM | POA: Diagnosis not present

## 2021-08-23 DIAGNOSIS — Z880 Allergy status to penicillin: Secondary | ICD-10-CM | POA: Diagnosis not present

## 2021-08-23 DIAGNOSIS — J9601 Acute respiratory failure with hypoxia: Secondary | ICD-10-CM | POA: Diagnosis not present

## 2021-08-23 DIAGNOSIS — I10 Essential (primary) hypertension: Secondary | ICD-10-CM | POA: Diagnosis not present

## 2021-08-23 DIAGNOSIS — N170 Acute kidney failure with tubular necrosis: Secondary | ICD-10-CM | POA: Diagnosis not present

## 2021-08-23 DIAGNOSIS — I4821 Permanent atrial fibrillation: Secondary | ICD-10-CM | POA: Diagnosis not present

## 2021-08-23 DIAGNOSIS — I700 Atherosclerosis of aorta: Secondary | ICD-10-CM | POA: Diagnosis not present

## 2021-08-23 DIAGNOSIS — I5082 Biventricular heart failure: Secondary | ICD-10-CM | POA: Diagnosis present

## 2021-08-23 DIAGNOSIS — I5023 Acute on chronic systolic (congestive) heart failure: Secondary | ICD-10-CM | POA: Diagnosis not present

## 2021-08-23 DIAGNOSIS — I5022 Chronic systolic (congestive) heart failure: Secondary | ICD-10-CM | POA: Diagnosis not present

## 2021-08-23 DIAGNOSIS — Z7901 Long term (current) use of anticoagulants: Secondary | ICD-10-CM | POA: Diagnosis not present

## 2021-08-23 DIAGNOSIS — I50812 Chronic right heart failure: Secondary | ICD-10-CM | POA: Diagnosis not present

## 2021-08-23 DIAGNOSIS — I459 Conduction disorder, unspecified: Secondary | ICD-10-CM | POA: Diagnosis not present

## 2021-08-23 DIAGNOSIS — I4891 Unspecified atrial fibrillation: Secondary | ICD-10-CM | POA: Diagnosis not present

## 2021-08-23 DIAGNOSIS — I83028 Varicose veins of left lower extremity with ulcer other part of lower leg: Secondary | ICD-10-CM | POA: Diagnosis present

## 2021-08-23 DIAGNOSIS — I2510 Atherosclerotic heart disease of native coronary artery without angina pectoris: Secondary | ICD-10-CM | POA: Diagnosis not present

## 2021-08-23 DIAGNOSIS — Z8616 Personal history of COVID-19: Secondary | ICD-10-CM | POA: Diagnosis not present

## 2021-08-23 DIAGNOSIS — I5020 Unspecified systolic (congestive) heart failure: Secondary | ICD-10-CM

## 2021-08-23 DIAGNOSIS — T887XXA Unspecified adverse effect of drug or medicament, initial encounter: Secondary | ICD-10-CM | POA: Diagnosis not present

## 2021-08-23 DIAGNOSIS — R0902 Hypoxemia: Secondary | ICD-10-CM | POA: Diagnosis not present

## 2021-08-23 DIAGNOSIS — Z79899 Other long term (current) drug therapy: Secondary | ICD-10-CM | POA: Diagnosis not present

## 2021-08-23 DIAGNOSIS — I2721 Secondary pulmonary arterial hypertension: Secondary | ICD-10-CM | POA: Diagnosis not present

## 2021-08-23 DIAGNOSIS — E1151 Type 2 diabetes mellitus with diabetic peripheral angiopathy without gangrene: Secondary | ICD-10-CM | POA: Diagnosis not present

## 2021-08-23 DIAGNOSIS — I272 Pulmonary hypertension, unspecified: Secondary | ICD-10-CM | POA: Diagnosis not present

## 2021-08-23 DIAGNOSIS — J811 Chronic pulmonary edema: Secondary | ICD-10-CM | POA: Diagnosis not present

## 2021-08-23 DIAGNOSIS — Z95810 Presence of automatic (implantable) cardiac defibrillator: Secondary | ICD-10-CM | POA: Diagnosis not present

## 2021-08-23 DIAGNOSIS — R55 Syncope and collapse: Secondary | ICD-10-CM | POA: Diagnosis not present

## 2021-08-23 DIAGNOSIS — I959 Hypotension, unspecified: Secondary | ICD-10-CM | POA: Diagnosis not present

## 2021-08-23 DIAGNOSIS — N179 Acute kidney failure, unspecified: Secondary | ICD-10-CM | POA: Diagnosis not present

## 2021-08-23 DIAGNOSIS — Z6841 Body Mass Index (BMI) 40.0 and over, adult: Secondary | ICD-10-CM | POA: Diagnosis not present

## 2021-08-23 DIAGNOSIS — I428 Other cardiomyopathies: Secondary | ICD-10-CM | POA: Diagnosis not present

## 2021-08-23 DIAGNOSIS — E785 Hyperlipidemia, unspecified: Secondary | ICD-10-CM | POA: Diagnosis not present

## 2021-08-23 DIAGNOSIS — Z8701 Personal history of pneumonia (recurrent): Secondary | ICD-10-CM | POA: Diagnosis not present

## 2021-08-23 DIAGNOSIS — I5021 Acute systolic (congestive) heart failure: Secondary | ICD-10-CM | POA: Diagnosis not present

## 2021-08-23 DIAGNOSIS — E1169 Type 2 diabetes mellitus with other specified complication: Secondary | ICD-10-CM | POA: Diagnosis not present

## 2021-08-23 DIAGNOSIS — I442 Atrioventricular block, complete: Secondary | ICD-10-CM | POA: Diagnosis not present

## 2021-08-23 DIAGNOSIS — I5042 Chronic combined systolic (congestive) and diastolic (congestive) heart failure: Secondary | ICD-10-CM | POA: Diagnosis not present

## 2021-08-23 DIAGNOSIS — G4733 Obstructive sleep apnea (adult) (pediatric): Secondary | ICD-10-CM | POA: Diagnosis not present

## 2021-08-23 DIAGNOSIS — I482 Chronic atrial fibrillation, unspecified: Secondary | ICD-10-CM | POA: Diagnosis not present

## 2021-08-23 DIAGNOSIS — I110 Hypertensive heart disease with heart failure: Secondary | ICD-10-CM | POA: Diagnosis not present

## 2021-08-23 DIAGNOSIS — I517 Cardiomegaly: Secondary | ICD-10-CM | POA: Diagnosis not present

## 2021-08-23 DIAGNOSIS — R001 Bradycardia, unspecified: Secondary | ICD-10-CM | POA: Diagnosis not present

## 2021-08-23 DIAGNOSIS — L97819 Non-pressure chronic ulcer of other part of right lower leg with unspecified severity: Secondary | ICD-10-CM | POA: Diagnosis not present

## 2021-08-23 DIAGNOSIS — I87333 Chronic venous hypertension (idiopathic) with ulcer and inflammation of bilateral lower extremity: Secondary | ICD-10-CM | POA: Diagnosis not present

## 2021-08-23 DIAGNOSIS — L97312 Non-pressure chronic ulcer of right ankle with fat layer exposed: Secondary | ICD-10-CM | POA: Diagnosis not present

## 2021-08-23 DIAGNOSIS — L97822 Non-pressure chronic ulcer of other part of left lower leg with fat layer exposed: Secondary | ICD-10-CM | POA: Diagnosis not present

## 2021-08-23 DIAGNOSIS — I83018 Varicose veins of right lower extremity with ulcer other part of lower leg: Secondary | ICD-10-CM | POA: Diagnosis not present

## 2021-08-23 DIAGNOSIS — E669 Obesity, unspecified: Secondary | ICD-10-CM | POA: Diagnosis not present

## 2021-08-23 DIAGNOSIS — L97829 Non-pressure chronic ulcer of other part of left lower leg with unspecified severity: Secondary | ICD-10-CM | POA: Diagnosis not present

## 2021-08-23 DIAGNOSIS — I509 Heart failure, unspecified: Secondary | ICD-10-CM | POA: Diagnosis not present

## 2021-08-23 DIAGNOSIS — I2781 Cor pulmonale (chronic): Secondary | ICD-10-CM | POA: Diagnosis not present

## 2021-08-23 DIAGNOSIS — Z8249 Family history of ischemic heart disease and other diseases of the circulatory system: Secondary | ICD-10-CM | POA: Diagnosis not present

## 2021-08-23 DIAGNOSIS — I872 Venous insufficiency (chronic) (peripheral): Secondary | ICD-10-CM | POA: Diagnosis not present

## 2021-08-23 LAB — ECHOCARDIOGRAM COMPLETE
Area-P 1/2: 4.33 cm2
S' Lateral: 4.21 cm

## 2021-08-23 MED ORDER — PERFLUTREN LIPID MICROSPHERE
1.0000 mL | INTRAVENOUS | Status: AC | PRN
  Administered 2021-08-23: 2 mL via INTRAVENOUS

## 2021-08-24 ENCOUNTER — Encounter: Payer: Self-pay | Admitting: Cardiovascular Disease

## 2021-08-24 ENCOUNTER — Ambulatory Visit: Payer: Medicare Other | Admitting: Cardiovascular Disease

## 2021-08-24 DIAGNOSIS — I5021 Acute systolic (congestive) heart failure: Secondary | ICD-10-CM

## 2021-08-24 DIAGNOSIS — G4733 Obstructive sleep apnea (adult) (pediatric): Secondary | ICD-10-CM

## 2021-08-24 DIAGNOSIS — I872 Venous insufficiency (chronic) (peripheral): Secondary | ICD-10-CM | POA: Diagnosis not present

## 2021-08-24 DIAGNOSIS — I4821 Permanent atrial fibrillation: Secondary | ICD-10-CM

## 2021-08-24 DIAGNOSIS — I10 Essential (primary) hypertension: Secondary | ICD-10-CM | POA: Diagnosis not present

## 2021-08-24 MED ORDER — LOSARTAN POTASSIUM 50 MG PO TABS: 50.0000 mg | ORAL_TABLET | Freq: Every day | ORAL | 0 refills | Status: DC

## 2021-08-24 NOTE — Assessment & Plan Note (Signed)
History of venous stasis ulcers being treated at the wound care clinic.  His legs are currently wrapped.

## 2021-08-24 NOTE — Assessment & Plan Note (Signed)
Rate controlled on Eliquis oral anticoagulation.

## 2021-08-24 NOTE — Assessment & Plan Note (Signed)
History of obstructive sleep apnea on CPAP. 

## 2021-08-24 NOTE — Patient Instructions (Addendum)
Medication Instructions:  Your physician recommends that you continue on your current medications as directed. Please refer to the Current Medication list given to you today.  *If you need a refill on your cardiac medications before your next appointment, please call your pharmacy*   Lab Work: Your physician recommends that you have labs drawn: BMET  If you have labs (blood work) drawn today and your tests are completely normal, you will receive your results only by: [place] (if you have MyChart) OR A paper copy in the mail If you have any lab test that is abnormal or we need to change your treatment, we will call you to review the results.   Follow-Up: At [facility], you and your health needs are our priority.  As part of our continuing mission to provide you with exceptional heart care, we have created designated Provider Care Teams.  These Care Teams include your primary Cardiologist (physician) and Advanced Practice Providers (APPs -  Physician Assistants and Nurse Practitioners) who all work together to provide you with the care you need, when you need it.  We recommend signing up for the patient portal called "MyChart".  Sign up information is provided on this After Visit Summary.  MyChart is used to connect with patients for Virtual Visits (Telemedicine).  Patients are able to view lab/test results, encounter notes, upcoming appointments, etc.  Non-urgent messages can be sent to your provider as well.   To learn more about what you can do with MyChart, go to [email or web address].    Your next appointment:   6 month(s)  The format for your next appointment:   In Person  Provider:   Quay Burow, MD

## 2021-08-24 NOTE — Assessment & Plan Note (Signed)
History of essential hypertension a blood pressure measured today at 110/74.  He is on losartan but does not appear to be on a beta-blocker.

## 2021-08-24 NOTE — Assessment & Plan Note (Addendum)
History of nonischemic cardiomyopathy since 2019 when he developed a viral illness.  His EF has been as high as 45% but most recently down to 30 to 35% by 2D echo 08/23/2021.  He did have severe left ventricular hypertrophy and mild MR and TR.  PA pressures were 44.  He is on torsemide 20 mg p.o. twice daily and has been on higher doses with decreasing renal function.  He currently is on losartan as well but was scheduled to transition to [facility] but unfortunately because of cost constraints has not yet done this.  His serum creatinine increased as result of the torsemide from 0.90-1.57 and he is going back to 20 mg twice daily.  He has lost 20 pounds as result of torsemide and his weight loss drug moan job the [employer] .  I am going to check a basic metabolic panel today and refer him to the advanced heart failure clinic for medication initiation and titration.

## 2021-08-24 NOTE — Progress Notes (Signed)
08/24/2021 [name]   1955/04/03  [id number]  Primary Physician Ginger Organ., MD Primary Cardiologist: Lorretta Harp MD FACP, [place], [place], [place]  HPI:  [name] is a 66 y.o. morbidly overweight single Caucasian male with no children who is accompanied by his friend [name] today.  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  His primary care provider is Dr. Lang Snow at [facility] and his cardiologist was Dr. Percival Spanish however he has transition to myself.  He recently saw Laurann Montana, NP in the office 08/02/2021.  He has a 20-pack-year history tobacco abuse having quit 20 years ago.  He has treated hypertension, diabetes and hyperlipidemia.  There is no family history of heart disease Roselinda Bahena Cinoman heart tach or stroke.  He does have venous stasis ulcers being treated by the wound care center and severe lower extremity edema on torsemide.  He had a viral illness back in 2019 and subsequent to that developed a nonischemic cardiomyopathy with an EF in the 30 to 45% range.  3 years ago his EF was 45 to 50% but most recently on 08/23/2021 had declined to 30 to 35%.  He was recently transition from losartan to [facility].  His lower extremity edema has improved with increasing his torsemide from 20 mg p.o. twice daily to 40 mg p.o. twice daily however his serum creatinine is increased from 0.90 up to 1.57.  He does have significant dyspnea on exertion.   Current Meds  Medication Sig   acetaminophen (TYLENOL) 325 MG tablet Take 650 mg by mouth every 6 (six) hours as needed for moderate pain.    albuterol (VENTOLIN HFA) 108 (90 Base) MCG/ACT inhaler Inhale 2 puffs into the lungs every 6 (six) hours as needed for wheezing or shortness of breath.   ascorbic acid (VITAMIN C) 500 MG tablet Take 1 tablet (500 mg total) by mouth 3 (three) times daily.   atorvastatin (LIPITOR) 20 MG tablet TAKE 1 TABLET BY MOUTH EVERY DAY *PT OVERDUE FOR OFFICE VISIT*    cholecalciferol (VITAMIN D) 25 MCG (1000 UNIT) tablet Take 1 tablet (1,000 Units total) by mouth daily.   colchicine 0.6 MG tablet Take 0.6 mg by mouth 2 (two) times daily as needed.   Continuous Blood Gluc Receiver (FREESTYLE LIBRE 2 READER) DEVI 1 each by Does not apply route daily.   Continuous Blood Gluc Sensor (FREESTYLE LIBRE 2 SENSOR) MISC 1 each by Does not apply route daily.   ELIQUIS 5 MG TABS tablet TAKE 1 TABLET BY MOUTH TWICE A DAY   KLOR-CON M20 20 MEQ tablet Take 1 tablet (20 mEq total) by mouth daily.   Omega 3 340 MG CPDR Take 1 capsule (340 mg total) by mouth 2 (two) times daily.   omeprazole (PRILOSEC) 40 MG capsule Take 1 capsule (40 mg total) by mouth daily.   polycarbophil (FIBERCON) 625 MG tablet Take 1 tablet (625 mg total) by mouth daily.   sacubitril-valsartan (ENTRESTO) 24-26 MG Take 1 tablet by mouth 2 (two) times daily.   SANTYL 250 UNIT/GM ointment APPLY 1 APPLICATION. TOPICALLY DAILY.   sertraline (ZOLOFT) 50 MG tablet Take 1 tablet (50 mg total) by mouth daily.   torsemide (DEMADEX) 20 MG tablet Take 2 tablets (40 mg total) by mouth 2 (two) times daily.   vitamin B-12 (CYANOCOBALAMIN) 1000 MCG tablet Take 1 tablet (1,000 mcg total) by mouth daily.     Allergies  Allergen Reactions  Latex Hives and Swelling   Ace Inhibitors     Other reaction(s): cough   Penicillin G     Other reaction(s): rash    Social History   Socioeconomic History   Marital status: Divorced    Spouse name: Not on file   Number of children: Not on file   Years of education: Not on file   Highest education level: Not on file  Occupational History   Occupation: cosmetologist  Tobacco Use   Smoking status: Former    Packs/day: 0.50    Years: 20.00    Total pack years: 10.00    Types: Cigarettes    Quit date: 01/10/2009    Years since quitting: 12.6   Smokeless tobacco: Never  Vaping Use   Vaping Use: Never used  Substance and Sexual Activity   Alcohol use: Not Currently     Comment: 2-3 times per week   Drug use: No   Sexual activity: Not on file  Other Topics Concern   Not on file  Social History Narrative   Not on file   Social Determinants of Health   Financial Resource Strain: Not on file  Food Insecurity: Not on file  Transportation Needs: Not on file  Physical Activity: Not on file  Stress: Not on file  Social Connections: Not on file  Intimate Partner Violence: Not on file     Review of Systems: General: negative for chills, fever, night sweats or weight changes.  Cardiovascular: negative for chest pain, dyspnea on exertion, edema, orthopnea, palpitations, paroxysmal nocturnal dyspnea or shortness of breath Dermatological: negative for rash Respiratory: negative for cough or wheezing Urologic: negative for hematuria Abdominal: negative for nausea, vomiting, diarrhea, bright red blood per rectum, melena, or hematemesis Neurologic: negative for visual changes, syncope, or dizziness All other systems reviewed and are otherwise negative except as noted above.    Blood pressure 110/74, pulse 68, resp. rate 20, height '5\' 11"'$  (1.803 m), weight (!) 336 lb (152.4 kg), SpO2 97 %.  General appearance: alert and no distress Neck: no adenopathy, no carotid bruit, no JVD, supple, symmetrical, trachea midline, and thyroid not enlarged, symmetric, no tenderness/mass/nodules Lungs: clear to auscultation bilaterally Heart: irregularly irregular rhythm Extremities: Legs are wrapped but he apparently has significant peripheral edema. Pulses: 2+ and symmetric Skin: Skin color, texture, turgor normal. No rashes or lesions or venous stasis ulcers Neurologic: Grossly normal  EKG not performed today but history of this in A-fib and right bundle branch block.  ASSESSMENT AND PLAN:   Benign essential HTN History of essential hypertension a blood pressure measured today at 110/74.  He is on losartan but does not appear to be on a beta-blocker.  OSA  (obstructive sleep apnea) History of obstructive sleep apnea on CPAP  Acute systolic heart failure ([place]) History of nonischemic cardiomyopathy since 2019 when he developed a viral illness.  His EF has been as high as 45% but most recently down to 30 to 35% by 2D echo 08/23/2021.  He did have severe left ventricular hypertrophy and mild MR and TR.  PA pressures were 44.  He is on torsemide 20 mg p.o. twice daily and has been on higher doses with decreasing renal function.  He currently is on losartan as well but was scheduled to transition to [facility] but unfortunately because of cost constraints has not yet done this.  His serum creatinine increased as result of the torsemide from 0.90-1.57 and he is going back to 20 mg twice daily.  He has lost 20 pounds as result of torsemide and his weight loss drug moan job the [employer] .  I am going to check a basic metabolic panel today and refer him to the advanced heart failure clinic for medication initiation and titration.  Peripheral venous insufficiency History of venous stasis ulcers being treated at the wound care clinic.  His legs are currently wrapped.  Permanent atrial fibrillation (HCC) Rate controlled on Eliquis oral anticoagulation.     Lorretta Harp MD FACP,FACC,FAHA, [facility] 08/24/2021 3:21 PM

## 2021-08-25 ENCOUNTER — Encounter (HOSPITAL_BASED_OUTPATIENT_CLINIC_OR_DEPARTMENT_OTHER): Payer: Medicare Other | Admitting: Physician Assistant

## 2021-08-25 DIAGNOSIS — I87333 Chronic venous hypertension (idiopathic) with ulcer and inflammation of bilateral lower extremity: Secondary | ICD-10-CM | POA: Diagnosis not present

## 2021-08-25 DIAGNOSIS — L97822 Non-pressure chronic ulcer of other part of left lower leg with fat layer exposed: Secondary | ICD-10-CM | POA: Diagnosis not present

## 2021-08-25 DIAGNOSIS — L97312 Non-pressure chronic ulcer of right ankle with fat layer exposed: Secondary | ICD-10-CM | POA: Diagnosis not present

## 2021-08-25 DIAGNOSIS — I872 Venous insufficiency (chronic) (peripheral): Secondary | ICD-10-CM | POA: Diagnosis not present

## 2021-08-25 LAB — BASIC METABOLIC PANEL
BUN/Creatinine Ratio: 16 (ref 10–24)
BUN: 19 mg/dL (ref 8–27)
CO2: 24 mmol/L (ref 20–29)
Calcium: 9.2 mg/dL (ref 8.6–10.2)
Chloride: 102 mmol/L (ref 96–106)
Creatinine, Ser: 1.22 mg/dL (ref 0.76–1.27)
Glucose: 82 mg/dL (ref 70–99)
Potassium: 4.6 mmol/L (ref 3.5–5.2)
Sodium: 141 mmol/L (ref 134–144)
eGFR: 65 mL/min/{1.73_m2} (ref >59)

## 2021-08-25 NOTE — Progress Notes (Signed)
[name] ([id number]) , Visit Report for 08/25/2021 Chief Complaint Document Details Patient Name: Date of Service: [name], HEIDEMAN 08/25/2021 8:00 A M Medical Record Number: [id number] Patient Account Number: [id number] Date of Birth/Sex: Treating RN: July 29, 1955 (66 y.o. [name] Primary Care Provider: [name] Other Clinician: Referring Provider: Treating Provider/Extender: [name] in Treatment: 3 Information Obtained from: Patient Chief Complaint Patients presents for treatment of an open diabetic ulcer (x2) 08/04/2021; patient returns to clinic with bilateral leg wounds as well as areas on the right foot Electronic Signature(s) Signed: 08/25/2021 8:29:30 AM By: [name] PA-C Entered By: [name] on 08/25/2021 08:29:29 -------------------------------------------------------------------------------- Debridement Details Patient Name: Date of Service: [name], [name] 08/25/2021 8:00 A M Medical Record Number: [id number] Patient Account Number: [id number] Date of Birth/Sex: Treating RN: [birth year] (66 y.o. [name] Primary Care Provider: [name] Other Clinician: Referring Provider: Treating Provider/Extender: [name] in Treatment: 3 Debridement Performed for Assessment: Wound #7 Right,Medial Malleolus Performed By: Physician [name], PA Debridement Type: Debridement Severity of Tissue Pre Debridement: Fat layer exposed Level of Consciousness (Pre-procedure): Awake and Alert Pre-procedure Verification/Time Out Yes - 08:29 Taken: Start Time: 08:30 Pain Control: Lidocaine 4% T opical Solution T Area Debrided (L x W): otal 6.5 (cm) x 8 (cm) = 52 (cm) Tissue and other material debrided: Non-Viable, Slough, Subcutaneous, Slough Level: Skin/Subcutaneous Tissue Debridement Description: Excisional Instrument: Curette Bleeding: Minimum Hemostasis Achieved: Pressure End Time:  08:34 Response to Treatment: Procedure was tolerated well Level of Consciousness (Post- Awake and Alert procedure): Post Debridement Measurements of Total Wound Length: (cm) 6.5 Width: (cm) 8 Depth: (cm) 0.5 Volume: (cm) 20.42 Character of Wound/Ulcer Post Debridement: Stable Severity of Tissue Post Debridement: Fat layer exposed Post Procedure Diagnosis Same as Pre-procedure Electronic Signature(s) Signed: 08/25/2021 4:35:53 PM By: [name] PA-C Signed: 08/25/2021 5:13:36 PM By: Lorrin Jackson Entered By: Lorrin Jackson on 08/25/2021 08:42:03 -------------------------------------------------------------------------------- Debridement Details Patient Name: Date of Service: [name], [name] 08/25/2021 8:00 A M Medical Record Number: [id number] Patient Account Number: [id number] Date of Birth/Sex: Treating RN: 1955/02/09 (66 y.o. [name] Primary Care Provider: [name] Other Clinician: Referring Provider: Treating Provider/Extender: [name] in Treatment: 3 Debridement Performed for Assessment: Wound #8 Right,Lateral Malleolus Performed By: Physician [name], PA Debridement Type: Debridement Severity of Tissue Pre Debridement: Fat layer exposed Level of Consciousness (Pre-procedure): Awake and Alert Pre-procedure Verification/Time Out Yes - 08:29 Taken: Start Time: 08:34 Pain Control: Lidocaine 4% T opical Solution T Area Debrided (L x W): otal 2.5 (cm) x 4 (cm) = 10 (cm) Tissue and other material debrided: Non-Viable, Slough, Subcutaneous, Slough Level: Skin/Subcutaneous Tissue Debridement Description: Excisional Instrument: Curette Bleeding: Minimum Hemostasis Achieved: Pressure End Time: 08:38 Response to Treatment: Procedure was tolerated well Level of Consciousness (Post- Awake and Alert procedure): Post Debridement Measurements of Total Wound Length: (cm) 2.5 Width: (cm) 4 Depth: (cm) 0.1 Volume: (cm)  0.785 Character of Wound/Ulcer Post Debridement: Stable Severity of Tissue Post Debridement: Fat layer exposed Post Procedure Diagnosis Same as Pre-procedure Electronic Signature(s) Signed: 08/25/2021 4:35:53 PM By: [name] PA-C Signed: 08/25/2021 5:13:36 PM By: Lorrin Jackson Entered By: Lorrin Jackson on 08/25/2021 08:46:22 -------------------------------------------------------------------------------- Debridement Details Patient Name: Date of Service: [name], [name] 08/25/2021 8:00 A M Medical Record Number: [id number] Patient Account Number: [id number] Date of Birth/Sex: Treating RN: 04-Feb-1955 (66 y.o. [name]  Primary Care Provider: Other Clinician: [name] Referring Provider: Treating Provider/Extender: [name] in Treatment: 3 Debridement Performed for Assessment: Wound #11 Left,Medial Lower Leg Performed By: Physician [name], PA Debridement Type: Debridement Severity of Tissue Pre Debridement: Fat layer exposed Level of Consciousness (Pre-procedure): Awake and Alert Pre-procedure Verification/Time Out Yes - 08:29 Taken: Start Time: 08:38 Pain Control: Lidocaine 4% T opical Solution T Area Debrided (L x W): otal 2.8 (cm) x 1.3 (cm) = 3.64 (cm) Tissue and other material debrided: Non-Viable, Slough, Subcutaneous, Slough Level: Skin/Subcutaneous Tissue Debridement Description: Excisional Instrument: Curette Bleeding: Minimum Hemostasis Achieved: Pressure End Time: 08:42 Response to Treatment: Procedure was tolerated well Level of Consciousness (Post- Awake and Alert procedure): Post Debridement Measurements of Total Wound Length: (cm) 2.8 Width: (cm) 1.3 Depth: (cm) 0.1 Volume: (cm) 0.286 Character of Wound/Ulcer Post Debridement: Stable Severity of Tissue Post Debridement: Fat layer exposed Post Procedure Diagnosis Same as Pre-procedure Electronic Signature(s) Signed: 08/25/2021 4:35:53 PM By:  [name] PA-C Signed: 08/25/2021 5:13:36 PM By: Lorrin Jackson Entered By: Lorrin Jackson on 08/25/2021 08:51:30 -------------------------------------------------------------------------------- Debridement Details Patient Name: Date of Service: [name], [name] 08/25/2021 8:00 A M Medical Record Number: [id number] Patient Account Number: [id number] Date of Birth/Sex: Treating RN: 1955-11-02 (66 y.o. [name] Primary Care Provider: [name] Other Clinician: Referring Provider: Treating Provider/Extender: [name] in Treatment: 3 Debridement Performed for Assessment: Wound #12 Left,Anterior Lower Leg Performed By: Physician [name], PA Debridement Type: Debridement Severity of Tissue Pre Debridement: Fat layer exposed Level of Consciousness (Pre-procedure): Awake and Alert Pre-procedure Verification/Time Out Yes - 08:29 Taken: Start Time: 08:42 Pain Control: Lidocaine 4% T opical Solution T Area Debrided (L x W): otal 3.5 (cm) x 5.5 (cm) = 19.25 (cm) Tissue and other material debrided: Non-Viable, Slough, Subcutaneous, Slough Level: Skin/Subcutaneous Tissue Debridement Description: Excisional Instrument: Curette Bleeding: Minimum Hemostasis Achieved: Pressure End Time: 08:46 Response to Treatment: Procedure was tolerated well Level of Consciousness (Post- Level of Consciousness (Post- Awake and Alert procedure): Post Debridement Measurements of Total Wound Length: (cm) 3.5 Width: (cm) 5.5 Depth: (cm) 0.2 Volume: (cm) 3.024 Character of Wound/Ulcer Post Debridement: Stable Severity of Tissue Post Debridement: Fat layer exposed Post Procedure Diagnosis Same as Pre-procedure Electronic Signature(s) Signed: 08/25/2021 4:35:53 PM By: [name] PA-C Signed: 08/25/2021 5:13:36 PM By: Lorrin Jackson Entered By: Lorrin Jackson on 08/25/2021  08:52:27 -------------------------------------------------------------------------------- HPI Details Patient Name: Date of Service: Mccollum, [name] 08/25/2021 8:00 A M Medical Record Number: [id number] Patient Account Number: [id number] Date of Birth/Sex: Treating RN: [birth year] (66 y.o. [name] Primary Care Provider: [name] Other Clinician: Referring Provider: Treating Provider/Extender: [name] in Treatment: 3 History of Present Illness HPI Description: ADMISSION 03/22/2021 This is a 66 year old man with a past medical history significant for diabetes type 2, congestive heart failure, peripheral arterial disease, morbid obesity, venous insufficiency, and coronary artery disease. He has been followed by [name] in podiatry, who performed a transmetatarsal amputation on the left foot in August 2022. He had issues healing that wound, but based upon Dr. [name] notes, ultimately the TMA wound healed. During his recovery from that surgery, however, ulcers opened up over the DIP joint of the right second and third toe. These have apparently closed and reopened multiple times. It sounds like one of the issues has been moisture accumulation and maceration of the tissues causing them to reopen. At his  last visit with [name], on March 01, 2021, there continues to be problems with moisture and he was referred to wound care for further evaluation and management. He had a formal aortogram with runoff performed prior to his TMA. The findings are copied here: Patient has inline flow to both feet with no significant flow-limiting lesion that would be amenable to percutaneous or open revascularization. He does have an element of small vessel disease and has a short segment occlusion of the distal anterior tibial/dorsalis pedis artery on the left foot but does have posterior tibial artery flow. Would recommend management of wounds with amputation of toes  2 and 3 on the right foot if the wounds do not heal and deteriorate. Transmetatarsal amputation on the left side has as good a blood supply as it is going to get and hopefully this will heal in the future. Formal ABIs were done in January 2023. They are normal bilaterally. ABI Findings: +---------+------------------+-----+----------+--------+ Right Rt Pressure (mmHg)IndexWaveform Comment  +---------+------------------+-----+----------+--------+ Brachial 160     +---------+------------------+-----+----------+--------+ PTA 192 1.06 monophasic  +---------+------------------+-----+----------+--------+ DP 159 0.88 monophasic  +---------+------------------+-----+----------+--------+ Great T oe145 0.80    +---------+------------------+-----+----------+--------+ +--------+------------------+-----+---------+-------+ Left Lt Pressure (mmHg)IndexWaveform Comment +--------+------------------+-----+---------+-------+ [id number]     +--------+------------------+-----+---------+-------+ PTA 204 1.13 triphasic  +--------+------------------+-----+---------+-------+ DP 194 1.07 biphasic   +--------+------------------+-----+---------+-------+ +-------+-----------+-----------+------------+------------+ ABI/TBIT oday's ABIT oday's TBIPrevious ABIPrevious TBI +-------+-----------+-----------+------------+------------+ Right 1.06 0.8 1.26 0.65  +-------+-----------+-----------+------------+------------+ Left 1.13 amputation 1.15 amputation  +-------+-----------+-----------+------------+------------+ Previous ABI on 08/06/20 at [facility] Pedal pressures falsely elevated due to medial calcification. Summary: Right: Resting right ankle-brachial index is within normal range. The right toe-brachial index is normal. Left: Resting left ankle-brachial index is within normal range. READMISSION 08/04/2021 Mr. Calderwood is now a 66 year old man  who I remember from this clinic many years ago I think he had a right lower extremity predominantly venous wound at the time. He was here for 1 visit in March of this year had wounds on his right second and third toes we apparently dressed them many and they healed so he did not come back. He is listed in [place] is being a diabetic although the patient denies this says he is verified it with his primary doctor. In any case over the last several weeks or so according the patient although these wounds look somewhat more chronic than that he has developed predominantly large wounds on the right medial and right lateral ankle smaller areas on the left leg and areas on the dorsal aspect of his right second and third toes. Its not clear how he has been dressing these. 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 The patient has known PAD. He had an angiogram in August 2022 at which time he had nonhealing wounds in both feet. On the left his major vessels in the thigh were all patent. He had three-vessel patent to the level of the ankle. He had a very short occlusion in the left anterior tibial. On the right lower extremity the major vessels in his thigh were all patent. He had three-vessel runoff to the foot sluggish filling of the anterior tibial artery. He was felt to have some component of small vessel disease but nothing that was amenable or needed revascularization. It was recommended that he have amputation of the second and third toes on the right foot if they did not heal He has been following with [name] of podiatry. [name] got him  juxta lite stockings although I do not think he had them on properly he has uncontrolled edema in both legs Past medical history includes type 2 diabetes [although the patient really denies this], left TMA in 2022,lower extremity wounds in fact attendance at this clinic in 2009-2010,  A-fib on Eliquis, chronic venous insufficiency with secondary lymphedema history of non-Hodgkin's lymphoma. 08-11-2021 upon evaluation today patient presents for follow-up evaluation he was seen last Wednesday initially for inspection here in our clinic. With that being said he tells me that he unfortunately has been having a lot of drainage and is actually coming through his wrap. Fortunately I do not see any evidence of active infection locally or systemically at this time which is great news. No fevers, chills, nausea, vomiting, or diarrhea. With that being said there does appear to be some evidence of local infection based on what I am seeing today. 08-18-2021 upon evaluation today patient appears to be doing okay currently in regard to his wounds with that being said that he is doing much better but still has a long ways to go to get to where he wanted to be. I think the infection is significantly improved. He has another week of the antibiotic at this point. 08-25-2021 upon evaluation today patient's wounds are actually doing decently well he has erythema has significantly improved. I think the cellulitis is under controlled I am going to continue him on 2 more weeks of the Levaquin at 500 mg this is a lower dose but I am hoping it will be better for him. Electronic Signature(s) Signed: 08/25/2021 4:33:53 PM By: [name] PA-C Entered By: [name] on 08/25/2021 16:33:53 -------------------------------------------------------------------------------- Physical Exam Details Patient Name: Date of Service: Juul, [name] 08/25/2021 8:00 A M Medical Record Number: [id number] Patient Account Number: [id number] Date of Birth/Sex: Treating RN: 01/03/1956 (66 y.o. [name] Primary Care Provider: [name] Other Clinician: Referring Provider: Treating Provider/Extender: [name] in Treatment: 3 Constitutional Well-nourished and well-hydrated in no  acute distress. Respiratory normal breathing without difficulty. Psychiatric this patient is able to make decisions and demonstrates good insight into disease process. Alert and Oriented x 3. pleasant and cooperative. Notes With regard to the patient's wounds he did require sharp debridement today to clearway some of the necrotic debris he tolerated this debridement without complication and postdebridement the wound bed appears to be doing significantly better which is great news. No fevers, chills, nausea, vomiting, or diarrhea. Electronic Signature(s) Signed: 08/25/2021 4:34:11 PM By: [name] PA-C Entered By: [name] on 08/25/2021 16:34:11 -------------------------------------------------------------------------------- Physician Orders Details Patient Name: Date of Service: Vandegrift, [name] 08/25/2021 8:00 A M Medical Record Number: [id number] Patient Account Number: [id number] Date of Birth/Sex: Treating RN: [birth year] (66 y.o. [name] Primary Care Provider: [name] Other Clinician: Referring Provider: Treating Provider/Extender: [name] in Treatment: 3 Verbal / Phone Orders: No Diagnosis Coding ICD-10 Coding Code Description I89.0 Lymphedema, not elsewhere classified I87.333 Chronic venous hypertension (idiopathic) with ulcer and inflammation of bilateral lower extremity L97.828 Non-pressure chronic ulcer of other part of left lower leg with other specified severity L97.818 Non-pressure chronic ulcer of other part of right lower leg with other specified severity E11.621 Type 2 diabetes mellitus with foot ulcer L97.518 Non-pressure chronic ulcer of other part of right foot with other specified severity Follow-up Appointments ppointment in 1 week. - 09/01/21 @ 3:00 w/ Jeri Cos and [place], [place] #  8 Return A Nurse Visit: - Friday 09/03/21 @ 8:00 Overflow Rm #6 Other: - -Refill of Levaquin sent to pharmacy, take for 2  weeks. -[facility] will send antibiotic compound, bring to your appointments. -[facility] wound care supplies for your toes. Anesthetic (In clinic) Topical Lidocaine 5% applied to wound bed (In clinic) Topical Lidocaine 4% applied to wound bed Bathing/ Shower/ Hygiene May shower with protection but do not get wound dressing(s) wet. Edema Control - Lymphedema / SCD / Other Elevate legs to the level of the heart or above for 30 minutes daily and/or when sitting, a frequency of: - 3-4 times a day throughout the day. Avoid standing for long periods of time. Exercise regularly Wound Treatment Wound #10 - T Third oe Wound Laterality: Right Cleanser: Wound Cleanser Every Other Day/30 Days Discharge Instructions: Cleanse the wound with wound cleanser prior to applying a clean dressing using gauze sponges, not tissue or cotton balls. Topical: keystone Every Other Day/30 Days Discharge Instructions: Begin using topically to wounds once it gets sent to your house. Prim Dressing: KerraCel Ag Gelling Fiber Dressing, 4x5 in (silver alginate) Every Other Day/30 Days ary Discharge Instructions: Apply silver alginate to wound bed as instructed Secondary Dressing: Woven Gauze Sponges 2x2 in Every Other Day/30 Days Discharge Instructions: Apply over primary dressing as directed. Secured With: 58M Medipore Public affairs consultant Surgical T 2x10 (in/yd) Every Other Day/30 Days ape Discharge Instructions: Secure with tape as directed. Wound #11 - Lower Leg Wound Laterality: Left, Medial Peri-Wound Care: Zinc Oxide Ointment 30g tube 2 x Per Week/30 Days Discharge Instructions: Apply Zinc Oxide to periwound with each dressing change Peri-Wound Care: Sween Lotion (Moisturizing lotion) 2 x Per Week/30 Days Discharge Instructions: Apply moisturizing lotion as directed Topical: Keystone 2 x Per Week/30 Days Prim Dressing: IODOFLEX 0.9% Cadexomer Iodine Pad 4x6 cm 2 x Per Week/30 Days ary Discharge Instructions: Apply to  wound bed as instructed Secondary Dressing: ABD Pad, 8x10 2 x Per Week/30 Days Discharge Instructions: Apply over primary dressing as directed. Secondary Dressing: CarboFLEX Odor Control Dressing, 4x4 in 2 x Per Week/30 Days Discharge Instructions: Apply over primary dressing as directed. Secondary Dressing: Zetuvit Plus 4x8 in 2 x Per Week/30 Days Discharge Instructions: Apply over primary dressing as directed. Compression Wrap: ThreePress (3 layer compression wrap) 2 x Per Week/30 Days Discharge Instructions: Apply three layer compression as directed. Wound #12 - Lower Leg Wound Laterality: Left, Anterior Peri-Wound Care: Zinc Oxide Ointment 30g tube 2 x Per Week/30 Days Discharge Instructions: Apply Zinc Oxide to periwound with each dressing change Peri-Wound Care: Sween Lotion (Moisturizing lotion) 2 x Per Week/30 Days Discharge Instructions: Apply moisturizing lotion as directed Topical: Keystone 2 x Per Week/30 Days Prim Dressing: IODOFLEX 0.9% Cadexomer Iodine Pad 4x6 cm 2 x Per Week/30 Days ary Discharge Instructions: Apply to wound bed as instructed Secondary Dressing: ABD Pad, 8x10 2 x Per Week/30 Days Discharge Instructions: Apply over primary dressing as directed. Secondary Dressing: CarboFLEX Odor Control Dressing, 4x4 in 2 x Per Week/30 Days Discharge Instructions: Apply over primary dressing as directed. Secondary Dressing: Zetuvit Plus 4x8 in 2 x Per Week/30 Days Discharge Instructions: Apply over primary dressing as directed. Compression Wrap: ThreePress (3 layer compression wrap) 2 x Per Week/30 Days Discharge Instructions: Apply three layer compression as directed. Wound #7 - Malleolus Wound Laterality: Right, Medial Peri-Wound Care: Zinc Oxide Ointment 30g tube 2 x Per Week/30 Days Discharge Instructions: Apply Zinc Oxide to periwound with each dressing change Peri-Wound Care: Sween Lotion (  Moisturizing lotion) 2 x Per Week/30 Days Discharge Instructions: Apply  moisturizing lotion as directed Topical: Keystone 2 x Per Week/30 Days Prim Dressing: IODOFLEX 0.9% Cadexomer Iodine Pad 4x6 cm 2 x Per Week/30 Days ary Discharge Instructions: Apply to wound bed as instructed Secondary Dressing: ABD Pad, 8x10 2 x Per Week/30 Days Discharge Instructions: Apply over primary dressing as directed. Secondary Dressing: CarboFLEX Odor Control Dressing, 4x4 in 2 x Per Week/30 Days Discharge Instructions: Apply over primary dressing as directed. Secondary Dressing: Zetuvit Plus 4x8 in 2 x Per Week/30 Days Discharge Instructions: Apply over primary dressing as directed. Compression Wrap: ThreePress (3 layer compression wrap) 2 x Per Week/30 Days Discharge Instructions: Apply three layer compression as directed. Wound #8 - Malleolus Wound Laterality: Right, Lateral Peri-Wound Care: Zinc Oxide Ointment 30g tube 2 x Per Week/30 Days Discharge Instructions: Apply Zinc Oxide to periwound with each dressing change Peri-Wound Care: Sween Lotion (Moisturizing lotion) 2 x Per Week/30 Days Discharge Instructions: Apply moisturizing lotion as directed Topical: Keystone 2 x Per Week/30 Days Prim Dressing: IODOFLEX 0.9% Cadexomer Iodine Pad 4x6 cm 2 x Per Week/30 Days ary Discharge Instructions: Apply to wound bed as instructed Secondary Dressing: ABD Pad, 8x10 2 x Per Week/30 Days Discharge Instructions: Apply over primary dressing as directed. Secondary Dressing: CarboFLEX Odor Control Dressing, 4x4 in 2 x Per Week/30 Days Discharge Instructions: Apply over primary dressing as directed. Secondary Dressing: Zetuvit Plus 4x8 in 2 x Per Week/30 Days Discharge Instructions: Apply over primary dressing as directed. Compression Wrap: ThreePress (3 layer compression wrap) 2 x Per Week/30 Days Discharge Instructions: Apply three layer compression as directed. Wound #9 - T Second oe Wound Laterality: Right Cleanser: Wound Cleanser Every Other Day/30 Days Discharge Instructions:  Cleanse the wound with wound cleanser prior to applying a clean dressing using gauze sponges, not tissue or cotton balls. Topical: keystone Every Other Day/30 Days Discharge Instructions: Begin using topically to wounds once it gets sent to your house. Prim Dressing: KerraCel Ag Gelling Fiber Dressing, 4x5 in (silver alginate) Every Other Day/30 Days ary Discharge Instructions: Apply silver alginate to wound bed as instructed Secondary Dressing: Woven Gauze Sponges 2x2 in Every Other Day/30 Days Discharge Instructions: Apply over primary dressing as directed. Secured With: 50M Medipore Public affairs consultant Surgical T 2x10 (in/yd) Every Other Day/30 Days ape Discharge Instructions: Secure with tape as directed. Patient Medications llergies: latex, ACE Inhibitors, penicillin A Notifications Medication Indication Start End 08/25/2021 levofloxacin DOSE 1 - oral 500 mg tablet - 1 tablet oral taken 1 time per day for 14 days Electronic Signature(s) Signed: 08/25/2021 10:02:11 AM By: [name] PA-C Entered By: [name] on 08/25/2021 10:02:10 -------------------------------------------------------------------------------- Problem List Details Patient Name: Date of Service: Dudzik, [name] 08/25/2021 8:00 A M Medical Record Number: [id number] Patient Account Number: [id number] Date of Birth/Sex: Treating RN: [birth year] (66 y.o. [name] Primary Care Provider: [name] Other Clinician: Referring Provider: Treating Provider/Extender: [name] in Treatment: 3 Active Problems ICD-10 Encounter Code Description Active Date MDM Diagnosis I89.0 Lymphedema, not elsewhere classified 08/04/2021 No Yes I87.333 Chronic venous hypertension (idiopathic) with ulcer and inflammation of 08/04/2021 No Yes bilateral lower extremity L97.828 Non-pressure chronic ulcer of other part of left lower leg with other specified 08/04/2021 No Yes severity L97.818 Non-pressure  chronic ulcer of other part of right lower leg with other specified 08/04/2021 No Yes severity E11.621 Type 2 diabetes mellitus with foot ulcer 08/04/2021 No Yes L97.518 Non-pressure chronic  ulcer of other part of right foot with other specified 08/04/2021 No Yes severity Inactive Problems Resolved Problems Electronic Signature(s) Signed: 08/25/2021 8:29:24 AM By: [name] PA-C Entered By: [name] on 08/25/2021 54:62:70 -------------------------------------------------------------------------------- Progress Note Details Patient Name: Date of Service: [name], [name] 08/25/2021 8:00 A M Medical Record Number: [id number] Patient Account Number: [id number] Date of Birth/Sex: Treating RN: 1955/09/05 (66 y.o. [name] Primary Care Provider: [name] Other Clinician: Referring Provider: Treating Provider/Extender: [name] in Treatment: 3 Subjective Chief Complaint Information obtained from Patient Patients presents for treatment of an open diabetic ulcer (x2) 08/04/2021; patient returns to clinic with bilateral leg wounds as well as areas on the right foot History of Present Illness (HPI) ADMISSION 03/22/2021 This is a 66 year old man with a past medical history significant for diabetes type 2, congestive heart failure, peripheral arterial disease, morbid obesity, venous insufficiency, and coronary artery disease. He has been followed by [name] in podiatry, who performed a transmetatarsal amputation on the left foot in August 2022. He had issues healing that wound, but based upon Dr. [name] notes, ultimately the TMA wound healed. During his recovery from that surgery, however, ulcers opened up over the DIP joint of the right second and third toe. These have apparently closed and reopened multiple times. It sounds like one of the issues has been moisture accumulation and maceration of the tissues causing them to reopen. At his last  visit with [name], on March 01, 2021, there continues to be problems with moisture and he was referred to wound care for further evaluation and management. He had a formal aortogram with runoff performed prior to his TMA. The findings are copied here: Patient has inline flow to both feet with no significant flow-limiting lesion that would be amenable to percutaneous or open revascularization. He does have an element of small vessel disease and has a short segment occlusion of the distal anterior tibial/dorsalis pedis artery on the left foot but does have posterior tibial artery flow. Would recommend management of wounds with amputation of toes 2 and 3 on the right foot if the wounds do not heal and deteriorate. Transmetatarsal amputation on the left side has as good a blood supply as it is going to get and hopefully this will heal in the future. Formal ABIs were done in January 2023. They are normal bilaterally. ABI Findings: +---------+------------------+-----+----------+--------+ Right Rt Pressure (mmHg)IndexWaveform Comment  +---------+------------------+-----+----------+--------+ Brachial 160    +---------+------------------+-----+----------+--------+ PTA 192 1.06 monophasic  +---------+------------------+-----+----------+--------+ DP 159 0.88 monophasic  +---------+------------------+-----+----------+--------+ Great T [id number] 0.80    +---------+------------------+-----+----------+--------+ +--------+------------------+-----+---------+-------+ Left Lt Pressure (mmHg)IndexWaveform Comment +--------+------------------+-----+---------+-------+ [id number]    +--------+------------------+-----+---------+-------+ PTA 204 1.13 triphasic  +--------+------------------+-----+---------+-------+ DP 194 1.07 biphasic    +--------+------------------+-----+---------+-------+ +-------+-----------+-----------+------------+------------+ ABI/TBIT oday's ABIT oday's TBIPrevious ABIPrevious TBI +-------+-----------+-----------+------------+------------+ Right 1.06 0.8 1.26 0.65  +-------+-----------+-----------+------------+------------+ Left 1.13 amputation 1.15 amputation  +-------+-----------+-----------+------------+------------+ Previous ABI on 08/06/20 at [facility] Pedal pressures falsely elevated due to medial calcification. Summary: Right: Resting right ankle-brachial index is within normal range. The right toe-brachial index is normal. Left: Resting left ankle-brachial index is within normal range. READMISSION 08/04/2021 Mr. Persky is now a 66 year old man who I remember from this clinic many years ago I think he had a right lower extremity predominantly venous wound at the time. He was here for 1 visit in March of this year had wounds on his right second and third toes we apparently dressed them many and they healed so he  did not come back. He is listed in [place] is being a diabetic although the patient denies this says he is verified it with his primary doctor. In any case over the last several weeks or so according the patient although these wounds look somewhat more chronic than that he has developed predominantly large wounds on the right medial and right lateral ankle smaller areas on the left leg and areas on the dorsal aspect of his right second and third toes. Its not clear how he has been dressing these. 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 The patient has known PAD. He had an angiogram in August 2022 at which time he had nonhealing wounds in both feet. On the left his major vessels in the thigh were all patent. He had three-vessel patent to the level of the ankle. He had a very short  occlusion in the left anterior tibial. On the right lower extremity the major vessels in his thigh were all patent. He had three-vessel runoff to the foot sluggish filling of the anterior tibial artery. He was felt to have some component of small vessel disease but nothing that was amenable or needed revascularization. It was recommended that he have amputation of the second and third toes on the right foot if they did not heal He has been following with [name] of podiatry. [name] got him juxta lite stockings although I do not think he had them on properly he has uncontrolled edema in both legs Past medical history includes type 2 diabetes [although the patient really denies this], left TMA in 2022,lower extremity wounds in fact attendance at this clinic in 2009-2010, A-fib on Eliquis, chronic venous insufficiency with secondary lymphedema history of non-Hodgkin's lymphoma. 08-11-2021 upon evaluation today patient presents for follow-up evaluation he was seen last Wednesday initially for inspection here in our clinic. With that being said he tells me that he unfortunately has been having a lot of drainage and is actually coming through his wrap. Fortunately I do not see any evidence of active infection locally or systemically at this time which is great news. No fevers, chills, nausea, vomiting, or diarrhea. With that being said there does appear to be some evidence of local infection based on what I am seeing today. 08-18-2021 upon evaluation today patient appears to be doing okay currently in regard to his wounds with that being said that he is doing much better but still has a long ways to go to get to where he wanted to be. I think the infection is significantly improved. He has another week of the antibiotic at this point. 08-25-2021 upon evaluation today patient's wounds are actually doing decently well he has erythema has significantly improved. I think the cellulitis is under controlled I  am going to continue him on 2 more weeks of the Levaquin at 500 mg this is a lower dose but I am hoping it will be better for him. Objective Constitutional Well-nourished and well-hydrated in no acute distress. Vitals Time Taken: 8:05 AM, Height: 71 in, Weight: 350 lbs, BMI: 48.8, Temperature: 97.9 F, Pulse: 71 bpm, Respiratory Rate: 18 breaths/min, Blood Pressure: 134/83 mmHg. Respiratory normal breathing without difficulty. Psychiatric this patient is able to make decisions and demonstrates good insight into disease process. Alert and Oriented x 3. pleasant and cooperative. General Notes: With regard to the patient's wounds he did require sharp debridement today to clearway some  of the necrotic debris he tolerated this debridement without complication and postdebridement the wound bed appears to be doing significantly better which is great news. No fevers, chills, nausea, vomiting, or diarrhea. Integumentary (Hair, Skin) Wound #10 status is Open. Original cause of wound was Gradually Appeared. The date acquired was: 07/16/2021. The wound has been in treatment 3 weeks. The wound is located on the Right T Third. The wound measures 0.3cm length x 0.3cm width x 0.1cm depth; 0.071cm^2 area and 0.007cm^3 volume. There is Fat oe Layer (Subcutaneous Tissue) exposed. There is no tunneling or undermining noted. There is a medium amount of serosanguineous drainage noted. The wound margin is distinct with the outline attached to the wound base. There is large (67-100%) red, pink granulation within the wound bed. There is a small (1-33%) amount of necrotic tissue within the wound bed including Adherent Slough. Wound #11 status is Open. Original cause of wound was Gradually Appeared. The date acquired was: 07/22/2021. The wound has been in treatment 3 weeks. The wound is located on the Left,Medial Lower Leg. The wound measures 2.8cm length x 1.3cm width x 0.1cm depth; 2.859cm^2 area and 0.286cm^3 volume.  There is Fat Layer (Subcutaneous Tissue) exposed. There is no tunneling or undermining noted. There is a large amount of serosanguineous drainage noted. The wound margin is distinct with the outline attached to the wound base. There is medium (34-66%) red, pink granulation within the wound bed. There is a medium (34-66%) amount of necrotic tissue within the wound bed including Adherent Slough. Wound #12 status is Open. Original cause of wound was Gradually Appeared. The date acquired was: 07/15/2021. The wound has been in treatment 3 weeks. The wound is located on the Left,Anterior Lower Leg. The wound measures 3.5cm length x 5.5cm width x 0.2cm depth; 15.119cm^2 area and 3.024cm^3 volume. There is Fat Layer (Subcutaneous Tissue) exposed. There is no tunneling or undermining noted. There is a large amount of serosanguineous drainage noted. The wound margin is distinct with the outline attached to the wound base. There is medium (34-66%) red, pink granulation within the wound bed. There is a medium (34-66%) amount of necrotic tissue within the wound bed including Adherent Slough. Wound #7 status is Open. Original cause of wound was Blister. The date acquired was: 07/21/2021. The wound has been in treatment 3 weeks. The wound is located on the Right,Medial Malleolus. The wound measures 6.5cm length x 8cm width x 0.5cm depth; 40.841cm^2 area and 20.42cm^3 volume. There is Fat Layer (Subcutaneous Tissue) exposed. There is no tunneling or undermining noted. There is a large amount of serosanguineous drainage noted. The wound margin is distinct with the outline attached to the wound base. There is small (1-33%) red granulation within the wound bed. There is a large (67-100%) amount of necrotic tissue within the wound bed including Adherent Slough. Wound #8 status is Open. Original cause of wound was Gradually Appeared. The date acquired was: 07/22/2021. The wound has been in treatment 3 weeks. The wound is  located on the Right,Lateral Malleolus. The wound measures 2.5cm length x 4cm width x 0.1cm depth; 7.854cm^2 area and 0.785cm^3 volume. There is Fat Layer (Subcutaneous Tissue) exposed. There is no tunneling or undermining noted. There is a large amount of serosanguineous drainage noted. The wound margin is distinct with the outline attached to the wound base. There is small (1-33%) red, pink granulation within the wound bed. There is a large (67- 100%) amount of necrotic tissue within the wound bed including Adherent Slough.  Wound #9 status is Open. Original cause of wound was Gradually Appeared. The date acquired was: 07/15/2021. The wound has been in treatment 3 weeks. The wound is located on the Right T Second. The wound measures 0.4cm length x 0.6cm width x 0.1cm depth; 0.188cm^2 area and 0.019cm^3 volume. There is oe Fat Layer (Subcutaneous Tissue) exposed. There is no tunneling or undermining noted. There is a medium amount of serosanguineous drainage noted. The wound margin is distinct with the outline attached to the wound base. There is large (67-100%) red, pink granulation within the wound bed. There is a small (1- 33%) amount of necrotic tissue within the wound bed including Adherent Slough. Assessment Active Problems ICD-10 Lymphedema, not elsewhere classified Chronic venous hypertension (idiopathic) with ulcer and inflammation of bilateral lower extremity Non-pressure chronic ulcer of other part of left lower leg with other specified severity Non-pressure chronic ulcer of other part of right lower leg with other specified severity Type 2 diabetes mellitus with foot ulcer Non-pressure chronic ulcer of other part of right foot with other specified severity Procedures Wound #11 Pre-procedure diagnosis of Wound #11 is a Venous Leg Ulcer located on the Left,Medial Lower Leg .Severity of Tissue Pre Debridement is: Fat layer exposed. There was a Excisional Skin/Subcutaneous Tissue  Debridement with a total area of 3.64 sq cm performed by [name], PA. With the following instrument(s): Curette to remove Non-Viable tissue/material. Material removed includes Subcutaneous Tissue and Slough and after achieving pain control using Lidocaine 4% T opical Solution. No specimens were taken. A time out was conducted at 08:29, prior to the start of the procedure. A Minimum amount of bleeding was controlled with Pressure. The procedure was tolerated well. Post Debridement Measurements: 2.8cm length x 1.3cm width x 0.1cm depth; 0.286cm^3 volume. Character of Wound/Ulcer Post Debridement is stable. Severity of Tissue Post Debridement is: Fat layer exposed. Post procedure Diagnosis Wound #11: Same as Pre-Procedure Pre-procedure diagnosis of Wound #11 is a Venous Leg Ulcer located on the Left,Medial Lower Leg . There was a Three Layer Compression Therapy Procedure by Lorrin Jackson, RN. Post procedure Diagnosis Wound #11: Same as Pre-Procedure Wound #12 Pre-procedure diagnosis of Wound #12 is a Venous Leg Ulcer located on the Left,Anterior Lower Leg .Severity of Tissue Pre Debridement is: Fat layer exposed. There was a Excisional Skin/Subcutaneous Tissue Debridement with a total area of 19.25 sq cm performed by [name], PA. With the following instrument(s): Curette to remove Non-Viable tissue/material. Material removed includes Subcutaneous Tissue and Slough and after achieving pain control using Lidocaine 4% T opical Solution. No specimens were taken. A time out was conducted at 08:29, prior to the start of the procedure. A Minimum amount of bleeding was controlled with Pressure. The procedure was tolerated well. Post Debridement Measurements: 3.5cm length x 5.5cm width x 0.2cm depth; 3.024cm^3 volume. Character of Wound/Ulcer Post Debridement is stable. Severity of Tissue Post Debridement is: Fat layer exposed. Post procedure Diagnosis Wound #12: Same as  Pre-Procedure Pre-procedure diagnosis of Wound #12 is a Venous Leg Ulcer located on the Left,Anterior Lower Leg . There was a Three Layer Compression Therapy Procedure by Lorrin Jackson, RN. Post procedure Diagnosis Wound #12: Same as Pre-Procedure Wound #7 Pre-procedure diagnosis of Wound #7 is a Venous Leg Ulcer located on the Right,Medial Malleolus .Severity of Tissue Pre Debridement is: Fat layer exposed. There was a Excisional Skin/Subcutaneous Tissue Debridement with a total area of 52 sq cm performed by [name], PA. With the following instrument(s): Curette  to remove Non-Viable tissue/material. Material removed includes Subcutaneous Tissue and Slough and after achieving pain control using Lidocaine 4% T opical Solution. No specimens were taken. A time out was conducted at 08:29, prior to the start of the procedure. A Minimum amount of bleeding was controlled with Pressure. The procedure was tolerated well. Post Debridement Measurements: 6.5cm length x 8cm width x 0.5cm depth; 20.42cm^3 volume. Character of Wound/Ulcer Post Debridement is stable. Severity of Tissue Post Debridement is: Fat layer exposed. Post procedure Diagnosis Wound #7: Same as Pre-Procedure Pre-procedure diagnosis of Wound #7 is a Venous Leg Ulcer located on the Right,Medial Malleolus . There was a Three Layer Compression Therapy Procedure by Lorrin Jackson, RN. Post procedure Diagnosis Wound #7: Same as Pre-Procedure Wound #8 Pre-procedure diagnosis of Wound #8 is a Venous Leg Ulcer located on the Right,Lateral Malleolus .Severity of Tissue Pre Debridement is: Fat layer exposed. There was a Excisional Skin/Subcutaneous Tissue Debridement with a total area of 10 sq cm performed by [name], PA. With the following instrument(s): Curette to remove Non-Viable tissue/material. Material removed includes Subcutaneous Tissue and Slough and after achieving pain control using Lidocaine 4% T opical Solution. No  specimens were taken. A time out was conducted at 08:29, prior to the start of the procedure. A Minimum amount of bleeding was controlled with Pressure. The procedure was tolerated well. Post Debridement Measurements: 2.5cm length x 4cm width x 0.1cm depth; 0.785cm^3 volume. Character of Wound/Ulcer Post Debridement is stable. Severity of Tissue Post Debridement is: Fat layer exposed. Post procedure Diagnosis Wound #8: Same as Pre-Procedure Pre-procedure diagnosis of Wound #8 is a Venous Leg Ulcer located on the Right,Lateral Malleolus . There was a Three Layer Compression Therapy Procedure by Lorrin Jackson, RN. Post procedure Diagnosis Wound #8: Same as Pre-Procedure Plan Follow-up Appointments: Return Appointment in 1 week. - 09/01/21 @ 3:00 w/ Jeri Cos and [place], [place] # 8 Nurse Visit: - Friday 09/03/21 @ 8:00 Overflow Rm #6 Other: - -Refill of Levaquin sent to pharmacy, take for 2 weeks. -[facility] will send antibiotic compound, bring to your appointments. -[facility] wound care supplies for your toes. Anesthetic: (In clinic) Topical Lidocaine 5% applied to wound bed (In clinic) Topical Lidocaine 4% applied to wound bed Bathing/ Shower/ Hygiene: May shower with protection but do not get wound dressing(s) wet. Edema Control - Lymphedema / SCD / Other: Elevate legs to the level of the heart or above for 30 minutes daily and/or when sitting, a frequency of: - 3-4 times a day throughout the day. Avoid standing for long periods of time. Exercise regularly The following medication(s) was prescribed: levofloxacin oral 500 mg tablet 1 1 tablet oral taken 1 time per day for 14 days starting 08/25/2021 WOUND #10: - T Third Wound Laterality: Right oe Cleanser: Wound Cleanser Every Other Day/30 Days Discharge Instructions: Cleanse the wound with wound cleanser prior to applying a clean dressing using gauze sponges, not tissue or cotton balls. Topical: keystone Every Other Day/30 Days Discharge  Instructions: Begin using topically to wounds once it gets sent to your house. Prim Dressing: KerraCel Ag Gelling Fiber Dressing, 4x5 in (silver alginate) Every Other Day/30 Days ary Discharge Instructions: Apply silver alginate to wound bed as instructed Secondary Dressing: Woven Gauze Sponges 2x2 in Every Other Day/30 Days Discharge Instructions: Apply over primary dressing as directed. Secured With: 25M Medipore Public affairs consultant Surgical T 2x10 (in/yd) Every Other Day/30 Days ape Discharge Instructions: Secure with tape as directed. WOUND #11: - Lower Leg Wound  Laterality: Left, Medial Peri-Wound Care: Zinc Oxide Ointment 30g tube 2 x Per Week/30 Days Discharge Instructions: Apply Zinc Oxide to periwound with each dressing change Peri-Wound Care: Sween Lotion (Moisturizing lotion) 2 x Per Week/30 Days Discharge Instructions: Apply moisturizing lotion as directed Topical: Keystone 2 x Per Week/30 Days Prim Dressing: IODOFLEX 0.9% Cadexomer Iodine Pad 4x6 cm 2 x Per Week/30 Days ary Discharge Instructions: Apply to wound bed as instructed Secondary Dressing: ABD Pad, 8x10 2 x Per Week/30 Days Discharge Instructions: Apply over primary dressing as directed. Secondary Dressing: CarboFLEX Odor Control Dressing, 4x4 in 2 x Per Week/30 Days Discharge Instructions: Apply over primary dressing as directed. Secondary Dressing: Zetuvit Plus 4x8 in 2 x Per Week/30 Days Discharge Instructions: Apply over primary dressing as directed. Com pression Wrap: ThreePress (3 layer compression wrap) 2 x Per Week/30 Days Discharge Instructions: Apply three layer compression as directed. WOUND #12: - Lower Leg Wound Laterality: Left, Anterior Peri-Wound Care: Zinc Oxide Ointment 30g tube 2 x Per Week/30 Days Discharge Instructions: Apply Zinc Oxide to periwound with each dressing change Peri-Wound Care: Sween Lotion (Moisturizing lotion) 2 x Per Week/30 Days Discharge Instructions: Apply moisturizing lotion as  directed Topical: Keystone 2 x Per Week/30 Days Prim Dressing: IODOFLEX 0.9% Cadexomer Iodine Pad 4x6 cm 2 x Per Week/30 Days ary Discharge Instructions: Apply to wound bed as instructed Secondary Dressing: ABD Pad, 8x10 2 x Per Week/30 Days Discharge Instructions: Apply over primary dressing as directed. Secondary Dressing: CarboFLEX Odor Control Dressing, 4x4 in 2 x Per Week/30 Days Discharge Instructions: Apply over primary dressing as directed. Secondary Dressing: Zetuvit Plus 4x8 in 2 x Per Week/30 Days Discharge Instructions: Apply over primary dressing as directed. Com pression Wrap: ThreePress (3 layer compression wrap) 2 x Per Week/30 Days Discharge Instructions: Apply three layer compression as directed. WOUND #7: - Malleolus Wound Laterality: Right, Medial Peri-Wound Care: Zinc Oxide Ointment 30g tube 2 x Per Week/30 Days Discharge Instructions: Apply Zinc Oxide to periwound with each dressing change Peri-Wound Care: Sween Lotion (Moisturizing lotion) 2 x Per Week/30 Days Discharge Instructions: Apply moisturizing lotion as directed Topical: Keystone 2 x Per Week/30 Days Prim Dressing: IODOFLEX 0.9% Cadexomer Iodine Pad 4x6 cm 2 x Per Week/30 Days ary Discharge Instructions: Apply to wound bed as instructed Secondary Dressing: ABD Pad, 8x10 2 x Per Week/30 Days Discharge Instructions: Apply over primary dressing as directed. Secondary Dressing: CarboFLEX Odor Control Dressing, 4x4 in 2 x Per Week/30 Days Discharge Instructions: Apply over primary dressing as directed. Secondary Dressing: Zetuvit Plus 4x8 in 2 x Per Week/30 Days Discharge Instructions: Apply over primary dressing as directed. Com pression Wrap: ThreePress (3 layer compression wrap) 2 x Per Week/30 Days Discharge Instructions: Apply three layer compression as directed. WOUND #8: - Malleolus Wound Laterality: Right, Lateral Peri-Wound Care: Zinc Oxide Ointment 30g tube 2 x Per Week/30 Days Discharge  Instructions: Apply Zinc Oxide to periwound with each dressing change Peri-Wound Care: Sween Lotion (Moisturizing lotion) 2 x Per Week/30 Days Discharge Instructions: Apply moisturizing lotion as directed Topical: Keystone 2 x Per Week/30 Days Prim Dressing: IODOFLEX 0.9% Cadexomer Iodine Pad 4x6 cm 2 x Per Week/30 Days ary Discharge Instructions: Apply to wound bed as instructed Secondary Dressing: ABD Pad, 8x10 2 x Per Week/30 Days Discharge Instructions: Apply over primary dressing as directed. Secondary Dressing: CarboFLEX Odor Control Dressing, 4x4 in 2 x Per Week/30 Days Discharge Instructions: Apply over primary dressing as directed. Secondary Dressing: Zetuvit Plus 4x8 in 2 x Per Week/30  Days Discharge Instructions: Apply over primary dressing as directed. Com pression Wrap: ThreePress (3 layer compression wrap) 2 x Per Week/30 Days Discharge Instructions: Apply three layer compression as directed. WOUND #9: - T Second Wound Laterality: Right oe Cleanser: Wound Cleanser Every Other Day/30 Days Discharge Instructions: Cleanse the wound with wound cleanser prior to applying a clean dressing using gauze sponges, not tissue or cotton balls. Topical: keystone Every Other Day/30 Days Discharge Instructions: Begin using topically to wounds once it gets sent to your house. Prim Dressing: KerraCel Ag Gelling Fiber Dressing, 4x5 in (silver alginate) Every Other Day/30 Days ary Discharge Instructions: Apply silver alginate to wound bed as instructed Secondary Dressing: Woven Gauze Sponges 2x2 in Every Other Day/30 Days Discharge Instructions: Apply over primary dressing as directed. Secured With: 15M Medipore Public affairs consultant Surgical T 2x10 (in/yd) Every Other Day/30 Days ape Discharge Instructions: Secure with tape as directed. 1. I am good recommend that we go ahead and continue with the wound care measures as before I think this is really doing a great job this includes the use of the zinc  around the edges of the wound followed by the [facility] topical antibiotics and right now we are using the Iodoflex to help clean up the surface of the wounds. 2. Also can recommend that we continue with the ABD pads to cover followed by CarboFlex and Zetuvit. 3. I am also can recommend patient should continue with the 3 layer compression wrapping which is keeping everything under good control. 4. I am also going to suggest based on what we are seeing that likely next week we will switch over to [facility] to all wounds if it is still doing as well as it is currently I want to do 1 more week with the Iodoflex however. We will see patient back for reevaluation in 1 week here in the clinic. If anything worsens or changes patient will contact our office for additional recommendations. Electronic Signature(s) Signed: 08/25/2021 4:35:07 PM By: [name] PA-C Entered By: [name] on 08/25/2021 16:35:07 -------------------------------------------------------------------------------- SuperBill Details Patient Name: Date of Service: [name], [name] 08/25/2021 Medical Record Number: [id number] Patient Account Number: [id number] Date of Birth/Sex: Treating RN: [birth year] (66 y.o. [name] Primary Care Provider: [name] Other Clinician: Referring Provider: Treating Provider/Extender: [name] in Treatment: 3 Diagnosis Coding ICD-10 Codes Code Description I89.0 Lymphedema, not elsewhere classified I87.333 Chronic venous hypertension (idiopathic) with ulcer and inflammation of bilateral lower extremity L97.828 Non-pressure chronic ulcer of other part of left lower leg with other specified severity L97.818 Non-pressure chronic ulcer of other part of right lower leg with other specified severity E11.621 Type 2 diabetes mellitus with foot ulcer L97.518 Non-pressure chronic ulcer of other part of right foot with other specified severity Facility  Procedures CPT4 Code: 85631497 Description: 11042 - DEB SUBQ TISSUE 20 SQ CM/< ICD-10 Diagnosis Description L97.828 Non-pressure chronic ulcer of other part of left lower leg with other specified L97.818 Non-pressure chronic ulcer of other part of right lower leg with other specified  L97.518 Non-pressure chronic ulcer of other part of right foot with other specified seve Modifier: severity severity rity Quantity: 1 CPT4 Code: [id number] Description: 85027 - DEB SUBQ TISS EA ADDL 20CM ICD-10 Diagnosis Description L97.828 Non-pressure chronic ulcer of other part of left lower leg with other specified L97.818 Non-pressure chronic ulcer of other part of right lower leg with other specified  L97.518 Non-pressure chronic ulcer of other part  of right foot with other specified seve Modifier: severity severity rity Quantity: 4 Physician Procedures : CPT4 Code Description Modifier [id number] 11042 - WC PHYS SUBQ TISS 20 SQ CM ICD-10 Diagnosis Description L97.828 Non-pressure chronic ulcer of other part of left lower leg with other specified severity L97.818 Non-pressure chronic ulcer of other part of  right lower leg with other specified severity L97.518 Non-pressure chronic ulcer of other part of right foot with other specified severity Quantity: 1 : [id number] 11045 - WC PHYS SUBQ TISS EA ADDL 20 CM ICD-10 Diagnosis Description L97.828 Non-pressure chronic ulcer of other part of left lower leg with other specified severity L97.818 Non-pressure chronic ulcer of other part of right lower leg with other  specified severity L97.518 Non-pressure chronic ulcer of other part of right foot with other specified severity Quantity: 4 Electronic Signature(s) Signed: 08/25/2021 4:35:31 PM By: [name] PA-C Entered By: [name] on 08/25/2021 16:35:30

## 2021-08-26 ENCOUNTER — Other Ambulatory Visit: Payer: Self-pay

## 2021-08-26 ENCOUNTER — Encounter (HOSPITAL_COMMUNITY)
Admission: EM | Disposition: A | Discharge status: Home Health | Payer: Self-pay | Source: Home / Self Care | Attending: Cardiovascular Disease

## 2021-08-26 ENCOUNTER — Encounter (HOSPITAL_COMMUNITY): Payer: Self-pay

## 2021-08-26 ENCOUNTER — Inpatient Hospital Stay (HOSPITAL_COMMUNITY)
Admission: EM | Admit: 2021-08-26 | Discharge: 2021-09-04 | DRG: 222 | Disposition: A | Discharge status: Home Health | Payer: Medicare Other | Attending: Internal Medicine | Admitting: Internal Medicine

## 2021-08-26 ENCOUNTER — Emergency Department (HOSPITAL_COMMUNITY): Payer: Medicare Other

## 2021-08-26 DIAGNOSIS — I4821 Permanent atrial fibrillation: Secondary | ICD-10-CM | POA: Diagnosis present

## 2021-08-26 DIAGNOSIS — C8210 Follicular lymphoma grade II, unspecified site: Secondary | ICD-10-CM

## 2021-08-26 DIAGNOSIS — L97829 Non-pressure chronic ulcer of other part of left lower leg with unspecified severity: Secondary | ICD-10-CM | POA: Diagnosis present

## 2021-08-26 DIAGNOSIS — I272 Pulmonary hypertension, unspecified: Secondary | ICD-10-CM | POA: Diagnosis present

## 2021-08-26 DIAGNOSIS — I4439 Other atrioventricular block: Secondary | ICD-10-CM | POA: Diagnosis not present

## 2021-08-26 DIAGNOSIS — L97819 Non-pressure chronic ulcer of other part of right lower leg with unspecified severity: Secondary | ICD-10-CM | POA: Diagnosis present

## 2021-08-26 DIAGNOSIS — I5020 Unspecified systolic (congestive) heart failure: Secondary | ICD-10-CM

## 2021-08-26 DIAGNOSIS — I2781 Cor pulmonale (chronic): Secondary | ICD-10-CM | POA: Diagnosis not present

## 2021-08-26 DIAGNOSIS — R001 Bradycardia, unspecified: Secondary | ICD-10-CM | POA: Diagnosis present

## 2021-08-26 DIAGNOSIS — Z6841 Body Mass Index (BMI) 40.0 and over, adult: Secondary | ICD-10-CM | POA: Diagnosis not present

## 2021-08-26 DIAGNOSIS — I459 Conduction disorder, unspecified: Secondary | ICD-10-CM

## 2021-08-26 DIAGNOSIS — I5023 Acute on chronic systolic (congestive) heart failure: Secondary | ICD-10-CM | POA: Diagnosis present

## 2021-08-26 DIAGNOSIS — N170 Acute kidney failure with tubular necrosis: Secondary | ICD-10-CM | POA: Diagnosis present

## 2021-08-26 DIAGNOSIS — E1151 Type 2 diabetes mellitus with diabetic peripheral angiopathy without gangrene: Secondary | ICD-10-CM | POA: Diagnosis present

## 2021-08-26 DIAGNOSIS — M160 Bilateral primary osteoarthritis of hip: Secondary | ICD-10-CM | POA: Diagnosis present

## 2021-08-26 DIAGNOSIS — Z79899 Other long term (current) drug therapy: Secondary | ICD-10-CM

## 2021-08-26 DIAGNOSIS — I2510 Atherosclerotic heart disease of native coronary artery without angina pectoris: Secondary | ICD-10-CM | POA: Diagnosis not present

## 2021-08-26 DIAGNOSIS — Z87891 Personal history of nicotine dependence: Secondary | ICD-10-CM

## 2021-08-26 DIAGNOSIS — E785 Hyperlipidemia, unspecified: Secondary | ICD-10-CM | POA: Diagnosis not present

## 2021-08-26 DIAGNOSIS — J9601 Acute respiratory failure with hypoxia: Secondary | ICD-10-CM | POA: Diagnosis present

## 2021-08-26 DIAGNOSIS — Z8701 Personal history of pneumonia (recurrent): Secondary | ICD-10-CM

## 2021-08-26 DIAGNOSIS — I482 Chronic atrial fibrillation, unspecified: Secondary | ICD-10-CM | POA: Diagnosis not present

## 2021-08-26 DIAGNOSIS — I959 Hypotension, unspecified: Secondary | ICD-10-CM | POA: Diagnosis present

## 2021-08-26 DIAGNOSIS — M19072 Primary osteoarthritis, left ankle and foot: Secondary | ICD-10-CM | POA: Diagnosis present

## 2021-08-26 DIAGNOSIS — I83028 Varicose veins of left lower extremity with ulcer other part of lower leg: Secondary | ICD-10-CM | POA: Diagnosis present

## 2021-08-26 DIAGNOSIS — I83018 Varicose veins of right lower extremity with ulcer other part of lower leg: Secondary | ICD-10-CM | POA: Diagnosis present

## 2021-08-26 DIAGNOSIS — Z9221 Personal history of antineoplastic chemotherapy: Secondary | ICD-10-CM

## 2021-08-26 DIAGNOSIS — I2721 Secondary pulmonary arterial hypertension: Secondary | ICD-10-CM | POA: Diagnosis not present

## 2021-08-26 DIAGNOSIS — I50812 Chronic right heart failure: Secondary | ICD-10-CM | POA: Diagnosis not present

## 2021-08-26 DIAGNOSIS — Z818 Family history of other mental and behavioral disorders: Secondary | ICD-10-CM

## 2021-08-26 DIAGNOSIS — I4891 Unspecified atrial fibrillation: Secondary | ICD-10-CM | POA: Diagnosis not present

## 2021-08-26 DIAGNOSIS — Z7901 Long term (current) use of anticoagulants: Secondary | ICD-10-CM | POA: Diagnosis not present

## 2021-08-26 DIAGNOSIS — E1169 Type 2 diabetes mellitus with other specified complication: Secondary | ICD-10-CM | POA: Diagnosis present

## 2021-08-26 DIAGNOSIS — I700 Atherosclerosis of aorta: Secondary | ICD-10-CM | POA: Diagnosis not present

## 2021-08-26 DIAGNOSIS — Z91040 Latex allergy status: Secondary | ICD-10-CM

## 2021-08-26 DIAGNOSIS — M170 Bilateral primary osteoarthritis of knee: Secondary | ICD-10-CM | POA: Diagnosis present

## 2021-08-26 DIAGNOSIS — I110 Hypertensive heart disease with heart failure: Secondary | ICD-10-CM | POA: Diagnosis present

## 2021-08-26 DIAGNOSIS — Z880 Allergy status to penicillin: Secondary | ICD-10-CM

## 2021-08-26 DIAGNOSIS — E669 Obesity, unspecified: Secondary | ICD-10-CM | POA: Diagnosis not present

## 2021-08-26 DIAGNOSIS — I5022 Chronic systolic (congestive) heart failure: Secondary | ICD-10-CM | POA: Diagnosis not present

## 2021-08-26 DIAGNOSIS — Z888 Allergy status to other drugs, medicaments and biological substances status: Secondary | ICD-10-CM

## 2021-08-26 DIAGNOSIS — I5082 Biventricular heart failure: Secondary | ICD-10-CM | POA: Diagnosis present

## 2021-08-26 DIAGNOSIS — I442 Atrioventricular block, complete: Secondary | ICD-10-CM | POA: Diagnosis present

## 2021-08-26 DIAGNOSIS — I5042 Chronic combined systolic (congestive) and diastolic (congestive) heart failure: Secondary | ICD-10-CM | POA: Diagnosis not present

## 2021-08-26 DIAGNOSIS — Z8616 Personal history of COVID-19: Secondary | ICD-10-CM | POA: Diagnosis not present

## 2021-08-26 DIAGNOSIS — Z8261 Family history of arthritis: Secondary | ICD-10-CM

## 2021-08-26 DIAGNOSIS — G4733 Obstructive sleep apnea (adult) (pediatric): Secondary | ICD-10-CM | POA: Diagnosis present

## 2021-08-26 DIAGNOSIS — N179 Acute kidney failure, unspecified: Secondary | ICD-10-CM | POA: Diagnosis not present

## 2021-08-26 DIAGNOSIS — I428 Other cardiomyopathies: Secondary | ICD-10-CM | POA: Diagnosis present

## 2021-08-26 DIAGNOSIS — Z823 Family history of stroke: Secondary | ICD-10-CM

## 2021-08-26 DIAGNOSIS — Z8249 Family history of ischemic heart disease and other diseases of the circulatory system: Secondary | ICD-10-CM

## 2021-08-26 DIAGNOSIS — C8590 Non-Hodgkin lymphoma, unspecified, unspecified site: Secondary | ICD-10-CM | POA: Diagnosis present

## 2021-08-26 DIAGNOSIS — R55 Syncope and collapse: Secondary | ICD-10-CM | POA: Diagnosis present

## 2021-08-26 HISTORY — PX: TEMPORARY PACEMAKER: CATH118268

## 2021-08-26 HISTORY — PX: ARTERIAL LINE INSERTION: CATH118227

## 2021-08-26 LAB — HEMOGLOBIN A1C
Hgb A1c MFr Bld: 6 % — ABNORMAL HIGH (ref 4.8–5.6)
Mean Plasma Glucose: 125.5 mg/dL

## 2021-08-26 LAB — COMPREHENSIVE METABOLIC PANEL
ALT: 10 U/L (ref 0–44)
AST: 21 U/L (ref 15–41)
Albumin: 3.3 g/dL — ABNORMAL LOW (ref 3.5–5.0)
Alkaline Phosphatase: 120 U/L (ref 38–126)
Anion gap: 10 (ref 5–15)
BUN: 20 mg/dL (ref 8–23)
CO2: 22 mmol/L (ref 22–32)
Calcium: 8.9 mg/dL (ref 8.9–10.3)
Chloride: 105 mmol/L (ref 98–111)
Creatinine, Ser: 1.51 mg/dL — ABNORMAL HIGH (ref 0.61–1.24)
GFR, Estimated: 51 mL/min — ABNORMAL LOW (ref >60)
Glucose, Bld: 109 mg/dL — ABNORMAL HIGH (ref 70–99)
Potassium: 5 mmol/L (ref 3.5–5.1)
Sodium: 137 mmol/L (ref 135–145)
Total Bilirubin: 1.8 mg/dL — ABNORMAL HIGH (ref 0.3–1.2)
Total Protein: 6.8 g/dL (ref 6.5–8.1)

## 2021-08-26 LAB — CBC WITH DIFFERENTIAL/PLATELET
Abs Immature Granulocytes: 0.03 10*3/uL (ref 0.00–0.07)
Basophils Absolute: 0.1 10*3/uL (ref 0.0–0.1)
Basophils Relative: 1 %
Eosinophils Absolute: 0.1 10*3/uL (ref 0.0–0.5)
Eosinophils Relative: 1 %
HCT: 41.3 % (ref 39.0–52.0)
Hemoglobin: 13 g/dL (ref 13.0–17.0)
Immature Granulocytes: 0 %
Lymphocytes Relative: 13 %
Lymphs Abs: 0.9 10*3/uL (ref 0.7–4.0)
MCH: 30 pg (ref 26.0–34.0)
MCHC: 31.5 g/dL (ref 30.0–36.0)
MCV: 95.4 fL (ref 80.0–100.0)
Monocytes Absolute: 0.7 10*3/uL (ref 0.1–1.0)
Monocytes Relative: 11 %
Neutro Abs: 5.1 10*3/uL (ref 1.7–7.7)
Neutrophils Relative %: 74 %
Platelets: 129 10*3/uL — ABNORMAL LOW (ref 150–400)
RBC: 4.33 MIL/uL (ref 4.22–5.81)
RDW: 17.2 % — ABNORMAL HIGH (ref 11.5–15.5)
WBC: 6.9 10*3/uL (ref 4.0–10.5)
nRBC: 0 % (ref 0.0–0.2)

## 2021-08-26 LAB — I-STAT CHEM 8, ED
BUN: 21 mg/dL (ref 8–23)
Calcium, Ion: 1 mmol/L — ABNORMAL LOW (ref 1.15–1.40)
Chloride: 104 mmol/L (ref 98–111)
Creatinine, Ser: 1.5 mg/dL — ABNORMAL HIGH (ref 0.61–1.24)
Glucose, Bld: 104 mg/dL — ABNORMAL HIGH (ref 70–99)
HCT: 41 % (ref 39.0–52.0)
Hemoglobin: 13.9 g/dL (ref 13.0–17.0)
Potassium: 4.8 mmol/L (ref 3.5–5.1)
Sodium: 136 mmol/L (ref 135–145)
TCO2: 19 mmol/L — ABNORMAL LOW (ref 22–32)

## 2021-08-26 LAB — CBG MONITORING, ED: Glucose-Capillary: 97 mg/dL (ref 70–99)

## 2021-08-26 LAB — LACTIC ACID, PLASMA: Lactic Acid, Venous: 2 mmol/L (ref 0.5–1.9)

## 2021-08-26 LAB — TROPONIN I (HIGH SENSITIVITY)
Troponin I (High Sensitivity): 17 ng/L (ref <18)
Troponin I (High Sensitivity): 18 ng/L — ABNORMAL HIGH (ref <18)

## 2021-08-26 LAB — HIV ANTIBODY (ROUTINE TESTING W REFLEX): HIV Screen 4th Generation wRfx: NONREACTIVE

## 2021-08-26 LAB — T4, FREE: Free T4: 2.21 ng/dL — ABNORMAL HIGH (ref 0.61–1.12)

## 2021-08-26 LAB — BRAIN NATRIURETIC PEPTIDE: B Natriuretic Peptide: 655.7 pg/mL — ABNORMAL HIGH (ref 0.0–100.0)

## 2021-08-26 LAB — GLUCOSE, CAPILLARY: Glucose-Capillary: 95 mg/dL (ref 70–99)

## 2021-08-26 LAB — TSH: TSH: 5.805 u[IU]/mL — ABNORMAL HIGH (ref 0.350–4.500)

## 2021-08-26 LAB — MAGNESIUM: Magnesium: 1.8 mg/dL (ref 1.7–2.4)

## 2021-08-26 LAB — PHOSPHORUS: Phosphorus: 4.1 mg/dL (ref 2.5–4.6)

## 2021-08-26 SURGERY — TEMPORARY PACEMAKER
Anesthesia: LOCAL | Laterality: Right

## 2021-08-26 MED ORDER — HEPARIN SODIUM (PORCINE) 5000 UNIT/ML IJ SOLN
5000.0000 [IU] | Freq: Three times a day (TID) | INTRAMUSCULAR | Status: DC
  Administered 2021-08-27: 5000 [IU] via SUBCUTANEOUS
  Filled 2021-08-26: qty 1

## 2021-08-26 MED ORDER — HEPARIN SODIUM (PORCINE) 5000 UNIT/ML IJ SOLN
5000.0000 [IU] | Freq: Three times a day (TID) | INTRAMUSCULAR | Status: DC
  Administered 2021-08-26: 5000 [IU] via SUBCUTANEOUS
  Filled 2021-08-26: qty 1

## 2021-08-26 MED ORDER — DOPAMINE-DEXTROSE 3.2-5 MG/ML-% IV SOLN
INTRAVENOUS | Status: AC
  Administered 2021-08-26: 10 ug/kg/min
  Filled 2021-08-26: qty 250

## 2021-08-26 MED ORDER — SODIUM CHLORIDE 0.9% FLUSH
3.0000 mL | Freq: Two times a day (BID) | INTRAVENOUS | Status: DC
  Administered 2021-08-27 – 2021-09-03 (×9): 3 mL via INTRAVENOUS

## 2021-08-26 MED ORDER — ATROPINE SULFATE 1 MG/ML IV SOLN: 0.5000 mg | INTRAVENOUS | Status: DC | PRN

## 2021-08-26 MED ORDER — ATROPINE SULFATE 1 MG/10ML IJ SOSY
PREFILLED_SYRINGE | INTRAMUSCULAR | Status: AC
  Administered 2021-08-26: 1 mg
  Filled 2021-08-26: qty 20

## 2021-08-26 MED ORDER — ASPIRIN 81 MG PO CHEW: 81.0000 mg | CHEWABLE_TABLET | ORAL | Status: DC

## 2021-08-26 MED ORDER — SODIUM CHLORIDE 0.9% FLUSH: 3.0000 mL | INTRAVENOUS | Status: DC | PRN

## 2021-08-26 MED ORDER — ONDANSETRON HCL 4 MG/2ML IJ SOLN: 4.0000 mg | Freq: Four times a day (QID) | INTRAMUSCULAR | Status: DC | PRN

## 2021-08-26 MED ORDER — INSULIN ASPART 100 UNIT/ML IJ SOLN
0.0000 [IU] | Freq: Three times a day (TID) | INTRAMUSCULAR | Status: DC
  Administered 2021-08-29: 2 [IU] via SUBCUTANEOUS

## 2021-08-26 MED ORDER — ATROPINE SULFATE 1 MG/10ML IJ SOSY: 0.5000 mg | PREFILLED_SYRINGE | INTRAMUSCULAR | Status: DC | PRN

## 2021-08-26 MED ORDER — ATORVASTATIN CALCIUM 10 MG PO TABS
20.0000 mg | ORAL_TABLET | Freq: Every day | ORAL | Status: DC
  Filled 2021-08-26: qty 2

## 2021-08-26 MED ORDER — HEPARIN (PORCINE) IN NACL 1000-0.9 UT/500ML-% IV SOLN
INTRAVENOUS | Status: DC | PRN
  Administered 2021-08-26: 500 mL

## 2021-08-26 MED ORDER — LIDOCAINE HCL (PF) 1 % IJ SOLN
INTRAMUSCULAR | Status: DC | PRN
  Administered 2021-08-26: 10 mL

## 2021-08-26 MED ORDER — HEPARIN (PORCINE) IN NACL 1000-0.9 UT/500ML-% IV SOLN
INTRAVENOUS | Status: AC
  Filled 2021-08-26: qty 500

## 2021-08-26 MED ORDER — LIDOCAINE HCL (PF) 1 % IJ SOLN
INTRAMUSCULAR | Status: AC
  Filled 2021-08-26: qty 30

## 2021-08-26 MED ORDER — SODIUM CHLORIDE 0.9 % IV SOLN: 250.0000 mL | INTRAVENOUS | Status: DC | PRN

## 2021-08-26 MED ORDER — LEVOFLOXACIN 750 MG PO TABS
750.0000 mg | ORAL_TABLET | Freq: Every day | ORAL | Status: DC
  Filled 2021-08-26: qty 1

## 2021-08-26 MED ORDER — SODIUM CHLORIDE 0.9% FLUSH
3.0000 mL | Freq: Two times a day (BID) | INTRAVENOUS | Status: DC
  Administered 2021-08-26 – 2021-09-03 (×9): 3 mL via INTRAVENOUS

## 2021-08-26 MED ORDER — ACETAMINOPHEN 325 MG PO TABS: 650.0000 mg | ORAL_TABLET | ORAL | Status: DC | PRN

## 2021-08-26 MED ORDER — ATROPINE SULFATE 1 MG/10ML IJ SOSY
PREFILLED_SYRINGE | INTRAMUSCULAR | Status: AC
  Administered 2021-08-26: 1 mg
  Filled 2021-08-26: qty 10

## 2021-08-26 MED ORDER — SODIUM CHLORIDE 0.9 % IV SOLN: INTRAVENOUS | Status: DC

## 2021-08-26 MED ORDER — SERTRALINE HCL 50 MG PO TABS
50.0000 mg | ORAL_TABLET | Freq: Every day | ORAL | Status: DC
  Filled 2021-08-26: qty 1

## 2021-08-26 SURGICAL SUPPLY — 12 items
CABLE ADAPT PACING TEMP 12FT (ADAPTER) IMPLANT
GLIDESHEATH SLEND SS 6F .021 (SHEATH) IMPLANT
KIT MICROPUNCTURE NIT STIFF (SHEATH) IMPLANT
MAT PREVALON FULL STRYKER (MISCELLANEOUS) IMPLANT
PACK CARDIAC CATHETERIZATION (CUSTOM PROCEDURE TRAY) ×3 IMPLANT
PROTECTION STATION PRESSURIZED (MISCELLANEOUS) ×2
SHEATH PINNACLE 6F 10CM (SHEATH) IMPLANT
SHEATH PROBE COVER 6X72 (BAG) IMPLANT
SLEEVE REPOSITIONING LENGTH 30 (MISCELLANEOUS) IMPLANT
STATION PROTECTION PRESSURIZED (MISCELLANEOUS) IMPLANT
WIRE MICRO SET SILHO 5FR 7 (SHEATH) IMPLANT
WIRE PACING TEMP ST TIP 5 (CATHETERS) IMPLANT

## 2021-08-26 NOTE — H&P (Addendum)
Cardiology Admission History and Physical:   Patient ID: [name] MRN: [id number]; DOB: November 14, 1955   Admission date: 08/26/2021  PCP:  Ginger Organ., MD   [facility] Providers Cardiologist:  Quay Burow, MD     Chief Complaint:  Near Syncope, Bradycardia   Patient Profile:   [name] is a 66 y.o. male with HFrEF, permanent atrial fibrillation, OSA, HTN, DM, HLD, history of tobacco use, venous stasis ulcers who is being seen 08/26/2021 for the evaluation of near syncope, bradycardia.   History of Present Illness:   [name] is a 66 year old male with above medical history who is followed by Dr. Henrene Pastor, previously followed by Dr. Percival Spanish.  Per chart review, in February 2019 patient was admitted for acute respiratory failure after 3 weeks of worsening shortness of breath.  That admission he required BiPAP, DuoNebs, Solu-Medrol.  He developed new onset atrial fibrillation with RVR.  Chest x-ray with multifocal pneumonia, went on to develop hard secondary to influenza and required intubation and ultimately tracheostomy.  Echocardiogram on 02/17/2017 showed moderate LVH, EF 25-30%, diffuse hypokinesis.  After that admission, patient followed with Dr. Percival Spanish.  Follow-up echocardiogram on 07/24/2017 showed EF had improved to 50-55%, normal wall thickness, mildly reduced RV systolic function.  Patient also wore a cardiac monitor in 08/2017 that showed atrial fibrillation with controlled ventricular rate.  Another repeat echocardiogram on 12/09/2020 showed EF 45-50%, mild LVH, mildly reduced RV systolic function.  Patient was seen by his PCP in 07/2021 and complained of shortness of breath.  He was sent to our cardiology office and seen by Laurann Montana on 08/02/2021.  Noted that his torsemide dose was increased. EKG at that visit showed atrial fibrillation, right bundle branch block, heart rate 73 bpm. She repeated an echocardiogram that was completed on 08/23/2021 and showed EF  30-35%, severe LVH, moderately reduced RV systolic function, moderate TVR.    Patient was last seen by cardiology on 08/24/2021.  At that time, patient was in atrial fibrillation with a heart rate of 68 bpm.   Patient presented to the ED on 08/26/2021 after he had a near syncopal episode while at work.  EMS was called, found patient to be hypotensive and bradycardic. Labs in the ED showed Na 137, K 5.0, creatinine 1.51, mag 1.8, WBC 6.9, hemoglobin 13.0, platelets 129. Initial hsTn 17.   Initial EKG showed atrial fibrillation, HR 28 BPM, RBBB. Follow up EKG showed atrial fibrillation, HR 53 BPM, RBBB.   On interview, patient reported that he got out of the shower this AM and felt very dizzy. Vision went white when he looked out the window, but he did not lose consciousness. Had some pain on the top of his shoulders, but denies having any chest pain or palpitations. Did have some trouble breathing during the episode. He went to work as usual. He was sitting down resting when he felt like he was going to faint again. He asked his coworker to take his pulse, but they were unable to. EMS was called.   Patient reports that he takes carvedilol 6.25 mg BID. He recently had his dose of torsemide increased, and he has had significantly increased urine output. He also started mounjaro about two weeks ago, and he has not been eating much since then. Believes he has been drinking enough water. Has noticed some dizziness upon standing recently.   Past Medical History:  Diagnosis Date   Acute systolic HF (heart failure) ([place])  Arthritis    lt foot, hips knees    Atrial fibrillation (HCC)    Diabetes mellitus, new onset ([place])    Diverticulosis    Dyslipidemia    Gout attack 01/16/2012   Hypertension    Internal hemorrhoids    Lymphoma ([place])    remission for about 2 years, chemo 3 years prior   Pneumonia due to COVID-19 virus 07/2018   Pulmonary hypertension ([place])    Sepsis ([place]) 02/2017   secondary to  influenza; requiring trach   Tubular adenoma of colon    Venous stasis ulcers ([place])     Past Surgical History:  Procedure Laterality Date   ABDOMINAL AORTOGRAM W/LOWER EXTREMITY N/A 08/14/2020   Procedure: ABDOMINAL AORTOGRAM W/LOWER EXTREMITY;  Surgeon: Elam Dutch, MD;  Location: [facility];  Service: Cardiovascular;  Laterality: N/A;   AMPUTATION TOE Left 05/08/2019   Procedure: AMPUTATION LEFT GREAT  TOE;  Surgeon: Trula Slade, DPM;  Location: [facility];  Service: Podiatry;  Laterality: Left;   BRONCHOSCOPY     ESOPHAGOGASTRODUODENOSCOPY ENDOSCOPY     Dr Royden Purl HERNIA REPAIR Left    2001   IR REMOVAL TUN ACCESS W/ PORT W/O FL MOD SED  02/03/2020   PORTA CATH INSERTION  2011   TRACHEOSTOMY  03/2017   with decannulation   TRANSMETATARSAL AMPUTATION Left 08/05/2020   Procedure: TRANSMETATARSAL AMPUTATION;  Surgeon: Trula Slade, DPM;  Location: [place];  Service: Podiatry;  Laterality: Left;     Medications Prior to Admission: Prior to Admission medications   Medication Sig Start Date End Date Taking? Authorizing Provider  acetaminophen (TYLENOL) 325 MG tablet Take 650 mg by mouth every 6 (six) hours as needed for moderate pain.     [provider]  albuterol (VENTOLIN HFA) 108 (90 Base) MCG/ACT inhaler Inhale 2 puffs into the lungs every 6 (six) hours as needed for wheezing or shortness of breath. 08/26/20   Angiulli, Lavon Paganini, PA-C  ascorbic acid (VITAMIN C) 500 MG tablet Take 1 tablet (500 mg total) by mouth 3 (three) times daily. 08/26/20   Angiulli, Lavon Paganini, PA-C  atorvastatin (LIPITOR) 20 MG tablet TAKE 1 TABLET BY MOUTH EVERY DAY *PT OVERDUE FOR OFFICE VISIT* 08/26/20   Angiulli, Lavon Paganini, PA-C  cholecalciferol (VITAMIN D) 25 MCG (1000 UNIT) tablet Take 1 tablet (1,000 Units total) by mouth daily. 08/26/20   Angiulli, Lavon Paganini, PA-C  colchicine 0.6 MG tablet Take 0.6 mg by mouth 2 (two) times daily as needed.    [provider]   Continuous Blood Gluc Receiver (FREESTYLE LIBRE 2 READER) DEVI 1 each by Does not apply route daily. 10/05/20   Raulkar, Clide Deutscher, MD  Continuous Blood Gluc Sensor (FREESTYLE LIBRE 2 SENSOR) MISC 1 each by Does not apply route daily. 10/05/20   Raulkar, Clide Deutscher, MD  ELIQUIS 5 MG TABS tablet TAKE 1 TABLET BY MOUTH TWICE A DAY 05/04/21   Minus Breeding, MD  KLOR-CON M20 20 MEQ tablet Take 1 tablet (20 mEq total) by mouth daily. 08/04/21   Loel Dubonnet, NP  losartan (COZAAR) 50 MG tablet Take 1 tablet (50 mg total) by mouth daily. 08/24/21   Lorretta Harp, MD  Omega 3 340 MG CPDR Take 1 capsule (340 mg total) by mouth 2 (two) times daily. 08/26/20   Angiulli, Lavon Paganini, PA-C  omeprazole (PRILOSEC) 40 MG capsule Take 1 capsule (40 mg total) by mouth daily. 08/26/20   Lauraine Rinne  J, PA-C  polycarbophil (FIBERCON) 625 MG tablet Take 1 tablet (625 mg total) by mouth daily. 08/26/20   Angiulli, Lavon Paganini, PA-C  sacubitril-valsartan (ENTRESTO) 24-26 MG Take 1 tablet by mouth 2 (two) times daily. 08/04/21   Loel Dubonnet, NP  SANTYL 250 UNIT/GM ointment APPLY 1 APPLICATION. TOPICALLY DAILY. 07/05/21   Trula Slade, DPM  sertraline (ZOLOFT) 50 MG tablet Take 1 tablet (50 mg total) by mouth daily. 08/26/20   Angiulli, Lavon Paganini, PA-C  torsemide (DEMADEX) 20 MG tablet Take 2 tablets (40 mg total) by mouth 2 (two) times daily. 08/02/21   Loel Dubonnet, NP  vitamin B-12 (CYANOCOBALAMIN) 1000 MCG tablet Take 1 tablet (1,000 mcg total) by mouth daily. 08/26/20   Angiulli, Lavon Paganini, PA-C  carvedilol (COREG) 6.25 MG tablet Take 6.25 mg by mouth 2 (two) times daily. 09/13/20 10/05/20  [provider]  sodium chloride (OCEAN) 0.65 % SOLN nasal spray Place 2 sprays into both nostrils as needed for congestion. 04/05/17 10/05/20  Bary Leriche, PA-C     Allergies:    Allergies  Allergen Reactions   Latex Hives and Swelling   Ace Inhibitors     Other reaction(s): cough   Penicillin G     Other  reaction(s): rash    Social History:   Social History   Socioeconomic History   Marital status: Divorced    Spouse name: Not on file   Number of children: Not on file   Years of education: Not on file   Highest education level: Not on file  Occupational History   Occupation: cosmetologist  Tobacco Use   Smoking status: Former    Packs/day: 0.50    Years: 20.00    Total pack years: 10.00    Types: Cigarettes    Quit date: 01/10/2009    Years since quitting: 12.6   Smokeless tobacco: Never  Vaping Use   Vaping Use: Never used  Substance and Sexual Activity   Alcohol use: Not Currently    Comment: 2-3 times per week   Drug use: No   Sexual activity: Not on file  Other Topics Concern   Not on file  Social History Narrative   Not on file   Social Determinants of Health   Financial Resource Strain: Not on file  Food Insecurity: Not on file  Transportation Needs: Not on file  Physical Activity: Not on file  Stress: Not on file  Social Connections: Not on file  Intimate Partner Violence: Not on file    Family History:   The patient's family history includes Arthritis in his mother; CVA (age of onset: 53) in his sister; Dementia in his mother; Dementia (age of onset: 9) in his brother; Hypertension in his mother. There is no history of Colon cancer, Pancreatic cancer, Stomach cancer, Rectal cancer, or Liver cancer.    ROS:  Please see the history of present illness.  All other ROS reviewed and negative.     Physical Exam/Data:   Vitals:   08/26/21 1238 08/26/21 1243 08/26/21 1250 08/26/21 1300  BP: (!) 72/58 96/67 106/84 (!) 89/59  Pulse: (!) 47 (!) 36 (!) 32 (!) 30  Resp: '18 12 18 15  '$ Temp:      TempSrc:      SpO2: 92% 96% 100% (!) 77%   No intake or output data in the 24 hours ending 08/26/21 1321    08/24/2021    2:38 PM 08/02/2021   11:02 AM 02/22/2021  3:01 PM  Last 3 Weights  Weight (lbs) 336 lb 353 lb 339 lb 4 oz  Weight (kg) 152.409 kg 160.12 kg  153.883 kg     There is no height or weight on file to calculate BMI.  General:  Obese, pleasant male who is sitting uncomfortably in the bed  HEENT: normal Neck: unable to assess JVD due to body habitus  Vascular: Radial pulses 2+ bilaterally   Cardiac:  distant heart sounds normal S1, S2; RRR; no murmur  Lungs:  clear to auscultation bilaterally, no wheezing, rhonchi or rales  Abd: soft, nontender, no hepatomegaly  Ext: trace edema, bilateral legs wrapped in bandages  Musculoskeletal:  No deformities, BUE and BLE strength normal and equal Skin: warm and dry  Neuro:  CNs 2-12 intact, no focal abnormalities noted Psych:  Normal affect    EKG:  The ECG that was done 08/26/2021 was personally reviewed and demonstrates atrial fibrillation, HR 28 BPM, RBBB  Relevant CV Studies:  Echocardiogram 08/23/2021  1. AFIB with occasional pauses noted. Left ventricular ejection fraction,  by estimation, is 30 to 35%. The left ventricle has moderately decreased  function. The left ventricle demonstrates global hypokinesis. There is  severe left ventricular  hypertrophy. Left ventricular diastolic parameters were normal.   2. Right ventricular systolic function is moderately reduced. The right  ventricular size is moderately enlarged. There is mildly elevated  pulmonary artery systolic pressure. The estimated right ventricular  systolic pressure is 95.1 mmHg.   3. Left atrial size was mildly dilated.   4. Right atrial size was mildly dilated.   5. The mitral valve is normal in structure. Mild mitral valve  regurgitation. No evidence of mitral stenosis.   6. Tricuspid valve regurgitation is moderate.   7. The aortic valve is tricuspid. There is mild calcification of the  aortic valve. There is mild thickening of the aortic valve. Aortic valve  regurgitation is not visualized. Aortic valve sclerosis is present, with  no evidence of aortic valve stenosis.   8. The inferior vena cava is dilated in  size with >50% respiratory  variability, suggesting right atrial pressure of 8 mmHg.   Comparison(s): Prior images reviewed side by side.   Laboratory Data:  High Sensitivity Troponin:  No results for input(s): "TROPONINIHS" in the last 720 hours.    Chemistry Recent Labs  Lab 08/24/21 1546 08/26/21 1232  NA 141 136  K 4.6 4.8  CL 102 104  CO2 24  --   GLUCOSE 82 104*  BUN 19 21  CREATININE 1.22 1.50*  CALCIUM 9.2  --     No results for input(s): "PROT", "ALBUMIN", "AST", "ALT", "ALKPHOS", "BILITOT" in the last 168 hours. Lipids No results for input(s): "CHOL", "TRIG", "HDL", "LABVLDL", "LDLCALC", "CHOLHDL" in the last 168 hours. Hematology Recent Labs  Lab 08/26/21 1232 08/26/21 1253  WBC  --  6.9  RBC  --  4.33  HGB 13.9 13.0  HCT 41.0 41.3  MCV  --  95.4  MCH  --  30.0  MCHC  --  31.5  RDW  --  17.2*  PLT  --  129*   Thyroid No results for input(s): "TSH", "FREET4" in the last 168 hours. BNPNo results for input(s): "BNP", "PROBNP" in the last 168 hours.  DDimer No results for input(s): "DDIMER" in the last 168 hours.   Radiology/Studies:  No results found.   Assessment and Plan:   Near Syncope  - Patient had 2 near syncopal episodes  today- one while standing and one while sitting. Denied chest pain, palpitations during episode, did have some shoulder pain and sob  - Initial HsTn negative  - Patient has a few reasons why he could have had near syncope-- his torsemide dose had recently increased and he has had significantly increased urine output since. His HR and BP were low with EMS (HR 28 upon arrival to ED). Also, patient recently started [facility] and has not been eating very much for the past few days, did not eat breakfast.  - Recent echocardiogram from 08/23/2021 showed EF 30-35%, severe LVH, moderately reduced RV systolic function - Creatinine up to 1.51 from 0.90 on 7/24 (date that torsemide was increased). Possible he is being overdiuresed   - Ordered  orthostatic vital signs  - Possibly related to bradycardia (HR was low with EMS and EKG showed HR 28 BPM). Follow telemetry while admitted  - Consider outpatient monitor at discharge if no obvious cause of syncope on telemetry  - R/L heart cath this admission due to reduced EF, sob  Bradycardia  - Initial EKG showed atrial fibrillation with HR 28 BPM  - Now, telemetry shows HR has improved to the 50s-60s with occasional 2 second pauses  - Hold carvedilol and follow HR on telemetry as carvedilol is washed out of his system - Given patient's very wide RBBB on EKG, may benefit from bi-ventricular ppm - TSH elevated, ordered free t4  Chronic HFrEF - Most recent echocardiogram from 08/23/2021 showed EF 30-35%, severe LVH, moderately reduced RV systolic function  - BNP elevated to  - Patient with clear lungs on exam, reports that his ankle edema has significantly improved since increased torsemide to 40 mg BID  - Ordered CXR -- patient does report having chronic SOB that has been somewhat stable, also followed by pulmonology  - Hold losartan, torsemide for now due to soft BP and AKI  - Patient does not appear to have had an ischemic evaluation-- plan R/L heart cath this admission. Hold eliquis in anticipation  Atrial Fibrillation - Patient in permanent atrial fibrillation - Hold Eliquis as Above  - Hold carvedilol as above  - Monitor on telemetry    Risk Assessment/Risk Scores:    New York Heart Association (NYHA) Functional Class NYHA Class II     Severity of Illness: The appropriate patient status for this patient is OBSERVATION. Observation status is judged to be reasonable and necessary in order to provide the required intensity of service to ensure the patient's safety. The patient's presenting symptoms, physical exam findings, and initial radiographic and laboratory data in the context of their medical condition is felt to place them at decreased risk for further clinical  deterioration. Furthermore, it is anticipated that the patient will be medically stable for discharge from the hospital within 2 midnights of admission.    For questions or updates, please contact [place] Please consult [email or web address] for contact info under     Signed, Margie Billet, PA-C  08/26/2021 1:21 PM    I have seen and examined the patient along with Margie Billet, PA-C , .  I have reviewed the chart, notes and new data.  I agree with PA/NP's note.  Key new complaints: complex history of cardiomyopathy (first documented 2019) with waxing and waning LV systolic dysfunction, no previous ischemic workup. In longstanding persistent atrial fibrillation (at least since 2019). Both disorders were initially diagnosed following admission with influenza related ARDS/respiratory failure in early 2019. Longstanding OSA, reports 100%  compliance with CPAP. Now presenting with syncope related to severe bradycardia and evidence of periods of high grade AV block with pauses up to 5 seconds, prolonged bradycardia down to 28 bpm (on carvedilol 6.25 mg twice daily, was on 25 mg twice daily in 2019). Remote history of potentially cardiotoxic chemo (CHOP-R) for follicular lymphoma in 9242. No history of CAD, no angina, but multiple risk factors (including DM). Recent increase in loop diuretic and reduced PO intake on Mounjaro. Key examination changes: Obese. Irregular rhythm with frequent episodes of >2.5" pauses. Clear lungs. Substantial lower extremity edema, symmetrical , chronic appearing. Cannot evaluate JVD due to adiposity. Key new findings / data: Reviewed the echos from 2019, 2020 and a few days ago. There is a clear decline in LVEF and an even more striking drop in RVEF from 2020 to now. Increase in creatinine from baseline. Very broad IVCD on ECG (QRS 170 ms) , atypical RBBB w left axis deviation. Creat 1.5 (baseline 1.2). BNP 656 (previous range 251-917). No significant troponin  abnormality. Chest CT for cancer screening shows aortic and 3-vessel coronary calcifications.  PLAN: Has biventricular heart failure, currently with extensive edema (RHF), but without overt left heart failure findings. Suspect syncope was due to severe bradycardia due to progressive AV block. Cannot fully exclude ventricular arrhythmia. Needs coronary angiography to exclude ischemia as a cause of his cardiomyopathy. Scheduled for R and L heart cath tomorrow w Dr. [place]. Has just been started on Entresto, but we need to stop beta blockers. Will benefit from SGLT2 inhibitors and spironolactone. Ultimately, I strongly believe that he will benefit from CRT (although the IVCD is an atypical RBBB, the QRS is extremely broad, 170-195 ms).  This will take care of the AV block and allow beta blocker therapy to be re-instituted. Reevaluate OSA therapy/BiPAP settings since he has worsening RV function. Complex situation - he will benefit from HF service referral.   Sanda Klein, MD, [place] [phone] 08/26/2021, 4:46 PM

## 2021-08-26 NOTE — ED Notes (Signed)
Pt having frequent, multi second pauses on cardiac monitoring, MD paged and notified. PD asymptomatic at this time. Will monitor for changes in status and update MD if needed.

## 2021-08-26 NOTE — ED Provider Notes (Signed)
[facility] DEPARTMENT Provider Note   CSN: [id number] Arrival date & time: 08/26/21  1153     History  Chief Complaint  Patient presents with   Near Syncope   Bradycardia    [name] is a 66 y.o. male PMH HFrEF, A fib, HTN, T2DM, COVID pneumonia c/b acute hypoxic respiratory failure requiring tracheostomy who is presenting with near syncope.  Patient brought in by EMS, who contribute the history.  They report that the patient was at work earlier today, when he suddenly felt dizzy/lightheaded.  EMS arrived, and found patient to be bradycardic and hypotensive.  They gave him 300 cc IV fluid prior to arrival, and systolics improved from 90 to 110s.  Patient also noted some shortness of breath and an episode of seeing "bright lights".  Currently, patient denies any chest pain, difficulty breathing, abdominal pain, worsening peripheral edema.  Patient reports some pain in his shoulders and shortness of breath, that is worsened when he attempts to stand up or ambulate.  Denies any recent fevers.  Notes some congestion in the past few days, for which he is taking Mucinex.  Patient also has a foot wound and reports that he is taking levofloxacin for the last 2 weeks.  Home Medications Prior to Admission medications   Medication Sig Start Date End Date Taking? Authorizing Provider  acetaminophen (TYLENOL) 325 MG tablet Take 650 mg by mouth every 6 (six) hours as needed for moderate pain.     [provider]  albuterol (VENTOLIN HFA) 108 (90 Base) MCG/ACT inhaler Inhale 2 puffs into the lungs every 6 (six) hours as needed for wheezing or shortness of breath. 08/26/20   Angiulli, Lavon Paganini, PA-C  ascorbic acid (VITAMIN C) 500 MG tablet Take 1 tablet (500 mg total) by mouth 3 (three) times daily. 08/26/20   Angiulli, Lavon Paganini, PA-C  atorvastatin (LIPITOR) 20 MG tablet TAKE 1 TABLET BY MOUTH EVERY DAY *PT OVERDUE FOR OFFICE VISIT* 08/26/20   Angiulli, Lavon Paganini, PA-C   cholecalciferol (VITAMIN D) 25 MCG (1000 UNIT) tablet Take 1 tablet (1,000 Units total) by mouth daily. 08/26/20   Angiulli, Lavon Paganini, PA-C  colchicine 0.6 MG tablet Take 0.6 mg by mouth 2 (two) times daily as needed.    [provider]  Continuous Blood Gluc Receiver (FREESTYLE LIBRE 2 READER) DEVI 1 each by Does not apply route daily. 10/05/20   Raulkar, Clide Deutscher, MD  Continuous Blood Gluc Sensor (FREESTYLE LIBRE 2 SENSOR) MISC 1 each by Does not apply route daily. 10/05/20   Raulkar, Clide Deutscher, MD  ELIQUIS 5 MG TABS tablet TAKE 1 TABLET BY MOUTH TWICE A DAY 05/04/21   Minus Breeding, MD  KLOR-CON M20 20 MEQ tablet Take 1 tablet (20 mEq total) by mouth daily. 08/04/21   Loel Dubonnet, NP  losartan (COZAAR) 50 MG tablet Take 1 tablet (50 mg total) by mouth daily. 08/24/21   Lorretta Harp, MD  Omega 3 340 MG CPDR Take 1 capsule (340 mg total) by mouth 2 (two) times daily. 08/26/20   Angiulli, Lavon Paganini, PA-C  omeprazole (PRILOSEC) 40 MG capsule Take 1 capsule (40 mg total) by mouth daily. 08/26/20   Angiulli, Lavon Paganini, PA-C  polycarbophil (FIBERCON) 625 MG tablet Take 1 tablet (625 mg total) by mouth daily. 08/26/20   Angiulli, Lavon Paganini, PA-C  sacubitril-valsartan (ENTRESTO) 24-26 MG Take 1 tablet by mouth 2 (two) times daily. 08/04/21   Loel Dubonnet, NP  SANTYL 250 UNIT/GM ointment APPLY 1 APPLICATION. TOPICALLY DAILY. 07/05/21   Trula Slade, DPM  sertraline (ZOLOFT) 50 MG tablet Take 1 tablet (50 mg total) by mouth daily. 08/26/20   Angiulli, Lavon Paganini, PA-C  torsemide (DEMADEX) 20 MG tablet Take 2 tablets (40 mg total) by mouth 2 (two) times daily. 08/02/21   Loel Dubonnet, NP  vitamin B-12 (CYANOCOBALAMIN) 1000 MCG tablet Take 1 tablet (1,000 mcg total) by mouth daily. 08/26/20   Angiulli, Lavon Paganini, PA-C  carvedilol (COREG) 6.25 MG tablet Take 6.25 mg by mouth 2 (two) times daily. 09/13/20 10/05/20  [provider]  sodium chloride (OCEAN) 0.65 % SOLN nasal spray Place  2 sprays into both nostrils as needed for congestion. 04/05/17 10/05/20  Bary Leriche, PA-C      Allergies    Latex, Ace inhibitors, and Penicillin g    Review of Systems   Review of Systems As in HPI.  Physical Exam Updated Vital Signs BP (!) 90/40 (BP Location: Right Arm)   Pulse (!) 28   Temp 98.1 F (36.7 C) (Oral)   Resp 13   SpO2 97%  Physical Exam Vitals and nursing note reviewed.  Constitutional:      General: He is not in acute distress.    Appearance: Normal appearance. He is well-developed. He is obese. He is not ill-appearing or diaphoretic.  HENT:     Head: Normocephalic and atraumatic.  Eyes:     Conjunctiva/sclera: Conjunctivae normal.  Neck:     Comments: Well-healed tracheostomy scar. Cardiovascular:     Rate and Rhythm: Regular rhythm. Bradycardia present.     Heart sounds: No murmur heard. Pulmonary:     Effort: Pulmonary effort is normal. No respiratory distress.     Breath sounds: Normal breath sounds. No wheezing or rales.  Abdominal:     Palpations: Abdomen is soft.     Tenderness: There is no abdominal tenderness.  Musculoskeletal:        General: No swelling.     Cervical back: Neck supple.     Right lower leg: Edema present.     Left lower leg: Edema present.     Comments: Bilateral lower extremity edema which patient reports is improving.  Skin:    General: Skin is warm and dry.  Neurological:     Mental Status: He is alert.     ED Results / Procedures / Treatments   Labs (all labs ordered are listed, but only abnormal results are displayed) Labs Reviewed  COMPREHENSIVE METABOLIC PANEL  CBC WITH DIFFERENTIAL/PLATELET  LACTIC ACID, PLASMA  LACTIC ACID, PLASMA  BRAIN NATRIURETIC PEPTIDE  MAGNESIUM  PHOSPHORUS  TSH  I-STAT CHEM 8, ED  TROPONIN I (HIGH SENSITIVITY)    EKG EKG Interpretation  Date/Time:  Thursday August 26 2021 12:17:49 EDT Ventricular Rate:  56 PR Interval:    QRS Duration: 214 QT Interval:  610 QTC  Calculation: 534 R Axis:   252 Text Interpretation: Ventricular escape complexes or rhythm Ventricular premature complexes Confirmed by Varney Biles [phone]) on 08/26/2021 1:02:30 PM  Radiology DG CHEST PORT 1 VIEW  Result Date: 08/26/2021 CLINICAL DATA:  Near syncopal episode.  CHF. EXAM: PORTABLE CHEST 1 VIEW COMPARISON:  07/22/2020 FINDINGS: Cardiac pads overlying the left side of the chest. Low lung volumes. Heart is enlarged for size. Again noted are prominent central vascular markings. Slightly prominent interstitial markings in right suprahilar region appear chronic. Negative for a pneumothorax. IMPRESSION: 1. Cardiomegaly with low lung  volumes. 2. Prominent central vascular markings without overt pulmonary edema. Electronically Signed   By: Markus Daft M.D.   On: 08/26/2021 14:49    Procedures Procedures   Medications Ordered in ED Medications  atropine injection 0.5 mg (has no administration in time range)  atropine 1 MG/10ML injection (1 mg  Given 08/26/21 1214)  atropine 1 MG/10ML injection (1 mg  Given 08/26/21 1220)    ED Course/ Medical Decision Making/ A&P                           Medical Decision Making Amount and/or Complexity of Data Reviewed Labs: ordered.  Risk Prescription drug management. Decision regarding hospitalization.   [name] is a 66 y.o. male PMH HFrEF, A fib, HTN, T2DM, COVID pneumonia c/b acute hypoxic respiratory failure requiring tracheostomy who is presenting with near syncope.    Vitals at presentation concerning for bradycardia with hypotension.  Patient is afebrile, satting well on room air.  Physical exam with normal heart sounds.  Lungs clear to auscultation bilaterally.  Abdomen is soft, nontender to palpation.  Significant pitting edema.  Patient is initially mentating well upon arrival.  Given his history of HFrEF, I will hold off on fluids for now.  Patient given 1 mg of atropine, without significant increase in heart rate.  Labs  have been ordered, are pending.  We will also plan to consult cardiology for additional management.  Lab work obtained, resulted notable for BNP elevated at 655.7.  Initial troponin 17.  CBC without leukocytosis or anemia.  CMP shows electrolytes within normal limits.  Creatinine 1.51, mildly increased from prior, but not wildly inconsistent with patient's baseline.  No elevated LFTs or anion gap.  Initial lactate elevated at 2.    Imaging was pursued, notable for chest x-ray with cardiomegaly without overt pulmonary edema per radiology read.  EKG obtained, demonstrates ventricular rate 56 bpm.  No evidence of abnormal intervals or acute ischemia.  Interpreted by myself and my attending.  Interventions include 2 mg atropine.  Cardiology consulted, came and evaluated the patient at bedside.  Please see their note for additional details.  They have agreed to accept the patient to their service.  The plan for this patient was discussed with Dr. Kathrynn Humble, who voiced agreement and who oversaw evaluation and treatment of this patient.   Final Clinical Impression(s) / ED Diagnoses Final diagnoses:  Bradycardia    Rx / DC Orders ED Discharge Orders     None         Faylene Million, MD 08/26/21 [place], [place], MD 08/28/21 [phone]

## 2021-08-26 NOTE — Progress Notes (Signed)
[name] ([id number]) , Visit Report for 08/25/2021 Arrival Information Details Patient Name: Date of Service: [place], [place] 08/25/2021 8:00 A M Medical Record Number: [id number] Patient Account Number: [id number] Date of Birth/Sex: Treating RN: October 10, 1955 (66 y.o. [name] Primary Care Tisha Cline: [name] Other Clinician: Referring Semaya Vida: Treating Merit Maybee/Extender: [name] in Treatment: 3 Visit Information History Since Last Visit Added or deleted any medications: No Patient Arrived: Wheel Chair Any new allergies or adverse reactions: No Arrival Time: 08:04 Had a fall or experienced change in No Accompanied By: wife activities of daily living that may affect Transfer Assistance: Manual risk of falls: Patient Identification Verified: Yes Signs or symptoms of abuse/neglect since last visito No Secondary Verification Process Completed: Yes Hospitalized since last visit: No Patient Requires Transmission-Based Precautions: No Implantable device outside of the clinic excluding No Patient Has Alerts: Yes cellular tissue based products placed in the center Patient Alerts: Patient on Blood Thinner since last visit: 01/2021 ABI L 1.13 R 1.06 Has Dressing in Place as Prescribed: Yes 01/2021 TBI L amp R 0.8 Has Compression in Place as Prescribed: Yes Pain Present Now: No Electronic Signature(s) Signed: 08/25/2021 5:13:36 PM By: Lorrin Jackson Entered By: Lorrin Jackson on 08/25/2021 08:04:54 -------------------------------------------------------------------------------- Compression Therapy Details Patient Name: Date of Service: [place], [name] 08/25/2021 8:00 A M Medical Record Number: [id number] Patient Account Number: [id number] Date of Birth/Sex: Treating RN: 1955-04-28 (66 y.o. [name] Primary Care Nalani Andreen: [name] Other Clinician: Referring [name]: Treating [name]/Extender: [name]  in Treatment: 3 Compression Therapy Performed for Wound Assessment: Wound #11 Left,Medial Lower Leg Performed By: Clinician Lorrin Jackson, RN Compression Type: Three Layer Post Procedure Diagnosis Same as Pre-procedure Electronic Signature(s) Signed: 08/25/2021 5:13:36 PM By: Lorrin Jackson Entered By: Lorrin Jackson on 08/25/2021 08:39:54 -------------------------------------------------------------------------------- Compression Therapy Details Patient Name: Date of Service: [place], [name] 08/25/2021 8:00 A M Medical Record Number: [id number] Patient Account Number: [id number] Date of Birth/Sex: Treating RN: 08/30/1955 (66 y.o. [name] Primary Care [name]: [name] Other Clinician: Referring [name]: Treating [name]/Extender: [name] in Treatment: 3 Compression Therapy Performed for Wound Assessment: Wound #12 Left,Anterior Lower Leg Performed By: Clinician Lorrin Jackson, RN Compression Type: Three Layer Post Procedure Diagnosis Same as Pre-procedure Electronic Signature(s) Signed: 08/25/2021 5:13:36 PM By: Lorrin Jackson Entered By: Lorrin Jackson on 08/25/2021 08:39:54 -------------------------------------------------------------------------------- Compression Therapy Details Patient Name: Date of Service: [name], [name] 08/25/2021 8:00 A M Medical Record Number: [id number] Patient Account Number: [id number] Date of Birth/Sex: Treating RN: 07-05-1955 (66 y.o. [name] Primary Care [name]: [name] Other Clinician: Referring [name]: Treating [name]/Extender: [name] in Treatment: 3 Compression Therapy Performed for Wound Assessment: Wound #7 Right,Medial Malleolus Performed By: Clinician Lorrin Jackson, RN Compression Type: Three Layer Post Procedure Diagnosis Same as Pre-procedure Electronic Signature(s) Signed: 08/25/2021 5:13:36 PM By: Lorrin Jackson Entered By:  Lorrin Jackson on 08/25/2021 08:39:54 -------------------------------------------------------------------------------- Compression Therapy Details Patient Name: Date of Service: [name], [name] 08/25/2021 8:00 A M Medical Record Number: [id number] Patient Account Number: [id number] Date of Birth/Sex: Treating RN: Jul 15, 1955 (66 y.o. [name] Primary Care [name]: [name] Other Clinician: Referring [name]: Treating [name]/Extender: [name] in Treatment: 3 Compression Therapy Performed for Wound Assessment: Wound #8 Right,Lateral Malleolus Performed By: Clinician Lorrin Jackson, RN Compression Type: Three Layer Post Procedure Diagnosis Same as Pre-procedure Electronic Signature(s) Signed:  08/25/2021 5:13:36 PM By: Lorrin Jackson Entered By: Lorrin Jackson on 08/25/2021 08:39:54 -------------------------------------------------------------------------------- Encounter Discharge Information Details Patient Name: Date of Service: [name], [name] 08/25/2021 8:00 A M Medical Record Number: [id number] Patient Account Number: [id number] Date of Birth/Sex: Treating RN: 07-22-1955 (66 y.o. [name] Primary Care [name]: [name] Other Clinician: Referring [name]: Treating [name]/Extender: [name] in Treatment: 3 Encounter Discharge Information Items Post Procedure Vitals Discharge Condition: Stable Temperature (F): 97.9 Ambulatory Status: Wheelchair Pulse (bpm): 71 Discharge Destination: Home Respiratory Rate (breaths/min): 18 Transportation: Private Auto Blood Pressure (mmHg): 134/83 Accompanied By: wife Schedule Follow-up Appointment: Yes Clinical Summary of Care: Provided on 08/25/2021 Form Type Recipient Paper Patient Patient Electronic Signature(s) Signed: 08/25/2021 5:13:36 PM By: Lorrin Jackson Entered By: Lorrin Jackson on 08/25/2021  08:56:28 -------------------------------------------------------------------------------- Lower Extremity Assessment Details Patient Name: Date of Service: Bain, [name] 08/25/2021 8:00 A M Medical Record Number: [id number] Patient Account Number: [id number] Date of Birth/Sex: Treating RN: Mar 06, 1955 (66 y.o. [name] Primary Care Steen Bisig: [name] Other Clinician: Referring Shamel Galyean: Treating Danil Wedge/Extender: [name] in Treatment: 3 Edema Assessment Assessed: [name]: Yes] [name]: Yes] Edema: [Left: Yes] [Right: Yes] Calf Left: Right: Point of Measurement: 31 cm From Medial Instep 45 cm 44 cm Ankle Left: Right: Point of Measurement: 11 cm From Medial Instep 26 cm 28 cm Vascular Assessment Pulses: Dorsalis Pedis Palpable: [Left:Yes] [Right:Yes] Electronic Signature(s) Signed: 08/25/2021 5:13:36 PM By: Lorrin Jackson Entered By: Lorrin Jackson on 08/25/2021 08:18:22 -------------------------------------------------------------------------------- Multi-Disciplinary Care Plan Details Patient Name: Date of Service: Seaman, [name] 08/25/2021 8:00 A M Medical Record Number: [id number] Patient Account Number: [id number] Date of Birth/Sex: Treating RN: July 23, 1955 (66 y.o. [name] Primary Care Fahed Morten: [name] Other Clinician: Referring Lesslie Mossa: Treating Eshika Reckart/Extender: [name] in Treatment: 3 Multidisciplinary Care Plan reviewed with physician Active Inactive Pain, Acute or Chronic Nursing Diagnoses: Pain, acute or chronic: actual or potential Potential alteration in comfort, pain Goals: Patient will verbalize adequate pain control and receive pain control interventions during procedures as needed Date Initiated: 08/04/2021 Target Resolution Date: 09/10/2021 Goal Status: Active Patient/caregiver will verbalize comfort level met Date Initiated: 08/04/2021 Target Resolution Date:  09/08/2021 Goal Status: Active Interventions: Encourage patient to take pain medications as prescribed Provide education on pain management Reposition patient for comfort Treatment Activities: Administer pain control measures as ordered : 08/04/2021 Notes: Venous Leg Ulcer Nursing Diagnoses: Knowledge deficit related to disease process and management Goals: Non-invasive venous studies are completed as ordered Date Initiated: 08/04/2021 Date Inactivated: 08/25/2021 Target Resolution Date: 08/13/2021 Goal Status: Met Patient/caregiver will verbalize understanding of disease process and disease management Date Initiated: 08/04/2021 Target Resolution Date: 09/16/2021 Goal Status: Active Interventions: Assess peripheral edema status every visit. Compression as ordered Treatment Activities: Non-invasive vascular studies : 08/04/2021 Notes: Wound/Skin Impairment Nursing Diagnoses: Knowledge deficit related to ulceration/compromised skin integrity Goals: Patient/caregiver will verbalize understanding of skin care regimen Date Initiated: 08/04/2021 Target Resolution Date: 09/15/2021 Goal Status: Active Interventions: Assess patient/caregiver ability to perform ulcer/skin care regimen upon admission and as needed Assess ulceration(s) every visit Provide education on ulcer and skin care Treatment Activities: Skin care regimen initiated : 08/04/2021 Topical wound management initiated : 08/04/2021 Notes: Electronic Signature(s) Signed: 08/25/2021 5:13:36 PM By: Lorrin Jackson Entered By: Lorrin Jackson on 08/25/2021 07:58:34 -------------------------------------------------------------------------------- Pain Assessment Details Patient Name: Date of Service: Maynez, [name] 08/25/2021 8:00 A M Medical Record Number: [id number] Patient Account Number:  [id number] Date of Birth/Sex: Treating RN: 02/11/1955 (66 y.o. [name] Primary Care Kalanie Fewell: [name] Other  Clinician: Referring Shawn Carattini: Treating Heinrich Fertig/Extender: [name] in Treatment: 3 Active Problems Location of Pain Severity and Description of Pain Patient Has Paino No Site Locations Pain Management and Medication Current Pain Management: Electronic Signature(s) Signed: 08/25/2021 5:13:36 PM By: Lorrin Jackson Entered By: Lorrin Jackson on 08/25/2021 08:06:01 -------------------------------------------------------------------------------- Patient/Caregiver Education Details Patient Name: Date of Service: [place]nax, [name] 8/16/2023andnbsp8:00 A M Medical Record Number: [id number] Patient Account Number: [id number] Date of Birth/Gender: Treating RN: [birth year] (66 y.o. [name] Primary Care Physician: [name] Other Clinician: Referring Physician: Treating Physician/Extender: [name] in Treatment: 3 Education Assessment Education Provided To: Patient Education Topics Provided Infection: Methods: Explain/Verbal, Printed Responses: State content correctly Venous: Methods: Explain/Verbal, Printed Responses: State content correctly Wound/Skin Impairment: Methods: Explain/Verbal, Printed Responses: State content correctly Electronic Signature(s) Signed: 08/25/2021 5:13:36 PM By: Lorrin Jackson Entered By: Lorrin Jackson on 08/25/2021 07:58:59 -------------------------------------------------------------------------------- Wound Assessment Details Patient Name: Date of Service: Barraclough, [name] 08/25/2021 8:00 A M Medical Record Number: [id number] Patient Account Number: [id number] Date of Birth/Sex: Treating RN: 11-29-1955 (66 y.o. [name] Primary Care Mirca Yale: [name] Other Clinician: Referring Siddarth Hsiung: Treating Gracee Ratterree/Extender: [name] in Treatment: 3 Wound Status Wound Number: 10 Primary Diabetic Wound/Ulcer of the Lower Extremity Etiology: Wound  Location: Right T Third oe Wound Open Wounding Event: Gradually Appeared Status: Date Acquired: 07/16/2021 Comorbid Sleep Apnea, Arrhythmia, Congestive Heart Failure, Hypertension, Weeks Of Treatment: 3 History: Hypotension, Peripheral Arterial Disease, Peripheral Venous Clustered Wound: No Disease, Type II Diabetes, Gout, Osteoarthritis Photos Wound Measurements Length: (cm) 0.3 Width: (cm) 0.3 Depth: (cm) 0.1 Area: (cm) 0.071 Volume: (cm) 0.007 % Reduction in Area: 74.2% % Reduction in Volume: 74.1% Epithelialization: Medium (34-66%) Tunneling: No Undermining: No Wound Description Classification: Grade 2 Wound Margin: Distinct, outline attached Exudate Amount: Medium Exudate Type: Serosanguineous Exudate Color: red, brown Foul Odor After Cleansing: No Slough/Fibrino Yes Wound Bed Granulation Amount: Large (67-100%) Exposed Structure Granulation Quality: Red, Pink Fascia Exposed: No Necrotic Amount: Small (1-33%) Fat Layer (Subcutaneous Tissue) Exposed: Yes Necrotic Quality: Adherent Slough Tendon Exposed: No Muscle Exposed: No Joint Exposed: No Bone Exposed: No Treatment Notes Wound #10 (Toe Third) Wound Laterality: Right Cleanser Wound Cleanser Discharge Instruction: Cleanse the wound with wound cleanser prior to applying a clean dressing using gauze sponges, not tissue or cotton balls. Peri-Wound Care Topical keystone Discharge Instruction: Begin using topically to wounds once it gets sent to your house. Primary Dressing KerraCel Ag Gelling Fiber Dressing, 4x5 in (silver alginate) Discharge Instruction: Apply silver alginate to wound bed as instructed Secondary Dressing Woven Gauze Sponges 2x2 in Discharge Instruction: Apply over primary dressing as directed. Secured With [employer] Surgical T 2x10 (in/yd) ape Discharge Instruction: Secure with tape as directed. Compression Wrap Compression Stockings Add-Ons Electronic  Signature(s) Signed: 08/25/2021 4:57:18 PM By: Erenest Blank Signed: 08/25/2021 5:13:36 PM By: Lorrin Jackson Entered By: Erenest Blank on 08/25/2021 08:25:33 -------------------------------------------------------------------------------- Wound Assessment Details Patient Name: Date of Service: Lequire, [name] 08/25/2021 8:00 A M Medical Record Number: [id number] Patient Account Number: [id number] Date of Birth/Sex: Treating RN: November 06, 1955 (66 y.o. [name] Primary Care Florinda Taflinger: [name] Other Clinician: Referring Meghan Warshawsky: Treating Reyanne Hussar/Extender: [name] in Treatment: 3 Wound Status Wound Number: 11 Primary Venous Leg Ulcer Etiology: Wound Location: Left, Medial Lower  Leg Secondary Lymphedema Wounding Event: Gradually Appeared Etiology: Date Acquired: 07/22/2021 Wound Open Weeks Of Treatment: 3 Status: Clustered Wound: No Comorbid Sleep Apnea, Arrhythmia, Congestive Heart Failure, History: Hypertension, Hypotension, Peripheral Arterial Disease, Peripheral Venous Disease, Type II Diabetes, Gout, Osteoarthritis Photos Wound Measurements Length: (cm) 2.8 Width: (cm) 1.3 Depth: (cm) 0.1 Area: (cm) 2.859 Volume: (cm) 0.286 % Reduction in Area: 48% % Reduction in Volume: 48% Epithelialization: Medium (34-66%) Tunneling: No Undermining: No Wound Description Classification: Full Thickness Without Exposed Support Structures Wound Margin: Distinct, outline attached Exudate Amount: Large Exudate Type: Serosanguineous Exudate Color: red, brown Foul Odor After Cleansing: No Slough/Fibrino Yes Wound Bed Granulation Amount: Medium (34-66%) Exposed Structure Granulation Quality: Red, Pink Fascia Exposed: No Necrotic Amount: Medium (34-66%) Fat Layer (Subcutaneous Tissue) Exposed: Yes Necrotic Quality: Adherent Slough Tendon Exposed: No Muscle Exposed: No Joint Exposed: No Bone Exposed: No Treatment Notes Wound #11 (Lower  Leg) Wound Laterality: Left, Medial Cleanser Peri-Wound Care Zinc Oxide Ointment 30g tube Discharge Instruction: Apply Zinc Oxide to periwound with each dressing change Sween Lotion (Moisturizing lotion) Discharge Instruction: Apply moisturizing lotion as directed Topical Keystone Primary Dressing IODOFLEX 0.9% Cadexomer Iodine Pad 4x6 cm Discharge Instruction: Apply to wound bed as instructed Secondary Dressing ABD Pad, 8x10 Discharge Instruction: Apply over primary dressing as directed. CarboFLEX Odor Control Dressing, 4x4 in Discharge Instruction: Apply over primary dressing as directed. Zetuvit Plus 4x8 in Discharge Instruction: Apply over primary dressing as directed. Secured With Compression Wrap ThreePress (3 layer compression wrap) Discharge Instruction: Apply three layer compression as directed. Compression Stockings Add-Ons Electronic Signature(s) Signed: 08/25/2021 4:57:18 PM By: Erenest Blank Signed: 08/25/2021 5:13:36 PM By: Lorrin Jackson Entered By: Erenest Blank on 08/25/2021 16:10:96 -------------------------------------------------------------------------------- Wound Assessment Details Patient Name: Date of Service: Suchy, [name] 08/25/2021 8:00 A M Medical Record Number: [id number] Patient Account Number: [id number] Date of Birth/Sex: Treating RN: [birth year] (66 y.o. [name] Primary Care Tanisia Yokley: [name] Other Clinician: Referring Orvan Papadakis: Treating Sammy Cassar/Extender: [name] in Treatment: 3 Wound Status Wound Number: 12 Primary Venous Leg Ulcer Etiology: Wound Location: Left, Anterior Lower Leg Secondary Lymphedema Wounding Event: Gradually Appeared Etiology: Date Acquired: 07/15/2021 Wound Open Weeks Of Treatment: 3 Status: Clustered Wound: No Comorbid Sleep Apnea, Arrhythmia, Congestive Heart Failure, History: Hypertension, Hypotension, Peripheral Arterial Disease, Peripheral Venous Disease,  Type II Diabetes, Gout, Osteoarthritis Photos Wound Measurements Length: (cm) 3.5 Width: (cm) 5.5 Depth: (cm) 0.2 Area: (cm) 15.119 Volume: (cm) 3.024 % Reduction in Area: -10.6% % Reduction in Volume: -121.2% Epithelialization: None Tunneling: No Undermining: No Wound Description Classification: Full Thickness Without Exposed Support Struct Wound Margin: Distinct, outline attached Exudate Amount: Large Exudate Type: Serosanguineous Exudate Color: red, brown ures Wound Bed Granulation Amount: Medium (34-66%) Exposed Structure Granulation Quality: Red, Pink Fascia Exposed: No Necrotic Amount: Medium (34-66%) Fat Layer (Subcutaneous Tissue) Exposed: Yes Necrotic Quality: Adherent Slough Tendon Exposed: No Muscle Exposed: No Joint Exposed: No Bone Exposed: No Treatment Notes Wound #12 (Lower Leg) Wound Laterality: Left, Anterior Cleanser Peri-Wound Care Zinc Oxide Ointment 30g tube Discharge Instruction: Apply Zinc Oxide to periwound with each dressing change Sween Lotion (Moisturizing lotion) Discharge Instruction: Apply moisturizing lotion as directed Topical Keystone Primary Dressing IODOFLEX 0.9% Cadexomer Iodine Pad 4x6 cm Discharge Instruction: Apply to wound bed as instructed Secondary Dressing ABD Pad, 8x10 Discharge Instruction: Apply over primary dressing as directed. CarboFLEX Odor Control Dressing, 4x4 in Discharge Instruction: Apply over primary dressing as directed. Zetuvit Plus 4x8 in Discharge Instruction: Apply over primary  dressing as directed. Secured With Compression Wrap ThreePress (3 layer compression wrap) Discharge Instruction: Apply three layer compression as directed. Compression Stockings Add-Ons Electronic Signature(s) Signed: 08/25/2021 4:57:18 PM By: Erenest Blank Signed: 08/25/2021 5:13:36 PM By: Lorrin Jackson Entered By: Erenest Blank on 08/25/2021  08:29:45 -------------------------------------------------------------------------------- Wound Assessment Details Patient Name: Date of Service: Fair, [name] 08/25/2021 8:00 A M Medical Record Number: [id number] Patient Account Number: [id number] Date of Birth/Sex: Treating RN: September 22, 1955 (66 y.o. [name] Primary Care Aziz Slape: [name] Other Clinician: Referring Keone Kamer: Treating Zyrell Carmean/Extender: [name] in Treatment: 3 Wound Status Wound Number: 7 Primary Venous Leg Ulcer Etiology: Wound Location: Right, Medial Malleolus Secondary Lymphedema Wounding Event: Blister Etiology: Date Acquired: 07/21/2021 Wound Open Weeks Of Treatment: 3 Status: Clustered Wound: No Comorbid Sleep Apnea, Arrhythmia, Congestive Heart Failure, History: Hypertension, Hypotension, Peripheral Arterial Disease, Peripheral Venous Disease, Type II Diabetes, Gout, Osteoarthritis Photos Wound Measurements Length: (cm) 6.5 Width: (cm) 8 Depth: (cm) 0.5 Area: (cm) 40.841 Volume: (cm) 20.42 % Reduction in Area: -6.7% % Reduction in Volume: 23.8% Epithelialization: None Tunneling: No Undermining: No Wound Description Classification: Full Thickness With Exposed Support Structures Wound Margin: Distinct, outline attached Exudate Amount: Large Exudate Type: Serosanguineous Exudate Color: red, brown Foul Odor After Cleansing: No Slough/Fibrino Yes Wound Bed Granulation Amount: Small (1-33%) Exposed Structure Granulation Quality: Red Fascia Exposed: No Necrotic Amount: Large (67-100%) Fat Layer (Subcutaneous Tissue) Exposed: Yes Necrotic Quality: Adherent Slough Tendon Exposed: No Muscle Exposed: No Joint Exposed: No Bone Exposed: No Treatment Notes Wound #7 (Malleolus) Wound Laterality: Right, Medial Cleanser Peri-Wound Care Zinc Oxide Ointment 30g tube Discharge Instruction: Apply Zinc Oxide to periwound with each dressing change Sween  Lotion (Moisturizing lotion) Discharge Instruction: Apply moisturizing lotion as directed Topical Keystone Primary Dressing IODOFLEX 0.9% Cadexomer Iodine Pad 4x6 cm Discharge Instruction: Apply to wound bed as instructed Secondary Dressing ABD Pad, 8x10 Discharge Instruction: Apply over primary dressing as directed. CarboFLEX Odor Control Dressing, 4x4 in Discharge Instruction: Apply over primary dressing as directed. Zetuvit Plus 4x8 in Discharge Instruction: Apply over primary dressing as directed. Secured With Compression Wrap ThreePress (3 layer compression wrap) Discharge Instruction: Apply three layer compression as directed. Compression Stockings Add-Ons Electronic Signature(s) Signed: 08/25/2021 4:57:18 PM By: Erenest Blank Signed: 08/25/2021 5:13:36 PM By: Lorrin Jackson Entered By: Erenest Blank on 08/25/2021 08:31:10 -------------------------------------------------------------------------------- Wound Assessment Details Patient Name: Date of Service: [place], [name] 08/25/2021 8:00 A M Medical Record Number: [id number] Patient Account Number: [id number] Date of Birth/Sex: Treating RN: [birth year] (66 y.o. [name] Primary Care Rana Adorno: [name] Other Clinician: Referring Zade Falkner: Treating Toshiko Kemler/Extender: [name] in Treatment: 3 Wound Status Wound Number: 8 Primary Venous Leg Ulcer Etiology: Wound Location: Right, Lateral Malleolus Secondary Lymphedema Wounding Event: Gradually Appeared Etiology: Date Acquired: 07/22/2021 Wound Open Weeks Of Treatment: 3 Status: Clustered Wound: Yes Comorbid Sleep Apnea, Arrhythmia, Congestive Heart Failure, History: Hypertension, Hypotension, Peripheral Arterial Disease, Peripheral Venous Disease, Type II Diabetes, Gout, Osteoarthritis Photos Wound Measurements Length: (cm) 2.5 Width: (cm) 4 Depth: (cm) 0.1 Area: (cm) 7.854 Volume: (cm) 0.785 % Reduction in Area:  66% % Reduction in Volume: 83% Epithelialization: Medium (34-66%) Tunneling: No Undermining: No Wound Description Classification: Full Thickness Without Exposed Support Structures Wound Margin: Distinct, outline attached Exudate Amount: Large Exudate Type: Serosanguineous Exudate Color: red, brown Foul Odor After Cleansing: No Slough/Fibrino Yes Wound Bed Granulation Amount: Small (1-33%) Exposed Structure Granulation Quality: Red, Pink Fascia Exposed: No Necrotic Amount:  Large (67-100%) Fat Layer (Subcutaneous Tissue) Exposed: Yes Necrotic Quality: Adherent Slough Tendon Exposed: No Muscle Exposed: No Joint Exposed: No Bone Exposed: No Treatment Notes Wound #8 (Malleolus) Wound Laterality: Right, Lateral Cleanser Peri-Wound Care Zinc Oxide Ointment 30g tube Discharge Instruction: Apply Zinc Oxide to periwound with each dressing change Sween Lotion (Moisturizing lotion) Discharge Instruction: Apply moisturizing lotion as directed Topical Keystone Primary Dressing IODOFLEX 0.9% Cadexomer Iodine Pad 4x6 cm Discharge Instruction: Apply to wound bed as instructed Secondary Dressing ABD Pad, 8x10 Discharge Instruction: Apply over primary dressing as directed. CarboFLEX Odor Control Dressing, 4x4 in Discharge Instruction: Apply over primary dressing as directed. Zetuvit Plus 4x8 in Discharge Instruction: Apply over primary dressing as directed. Secured With Compression Wrap ThreePress (3 layer compression wrap) Discharge Instruction: Apply three layer compression as directed. Compression Stockings Add-Ons Electronic Signature(s) Signed: 08/25/2021 4:57:18 PM By: Erenest Blank Signed: 08/25/2021 5:13:36 PM By: Lorrin Jackson Entered By: Erenest Blank on 08/25/2021 08:30:36 -------------------------------------------------------------------------------- Wound Assessment Details Patient Name: Date of Service: [place], [name] 08/25/2021 8:00 A M Medical Record Number:  [id number] Patient Account Number: [id number] Date of Birth/Sex: Treating RN: [birth year] (66 y.o. [name] Primary Care Diahn Waidelich: [name] Other Clinician: Referring Kellye Mizner: Treating Dyshon Philbin/Extender: [name] in Treatment: 3 Wound Status Wound Number: 9 Primary Diabetic Wound/Ulcer of the Lower Extremity Etiology: Wound Location: Right T Second oe Wound Open Wounding Event: Gradually Appeared Wounding Event: Gradually Appeared Status: Date Acquired: 07/15/2021 Comorbid Sleep Apnea, Arrhythmia, Congestive Heart Failure, Hypertension, Weeks Of Treatment: 3 History: Hypotension, Peripheral Arterial Disease, Peripheral Venous Clustered Wound: No Disease, Type II Diabetes, Gout, Osteoarthritis Photos Wound Measurements Length: (cm) 0.4 Width: (cm) 0.6 Depth: (cm) 0.1 Area: (cm) 0.188 Volume: (cm) 0.019 % Reduction in Area: 71.5% % Reduction in Volume: 71.2% Epithelialization: Medium (34-66%) Tunneling: No Undermining: No Wound Description Classification: Grade 2 Wound Margin: Distinct, outline attached Exudate Amount: Medium Exudate Type: Serosanguineous Exudate Color: red, brown Foul Odor After Cleansing: No Slough/Fibrino Yes Wound Bed Granulation Amount: Large (67-100%) Exposed Structure Granulation Quality: Red, Pink Fascia Exposed: No Necrotic Amount: Small (1-33%) Fat Layer (Subcutaneous Tissue) Exposed: Yes Necrotic Quality: Adherent Slough Tendon Exposed: No Muscle Exposed: No Joint Exposed: No Bone Exposed: No Treatment Notes Wound #9 (Toe Second) Wound Laterality: Right Cleanser Wound Cleanser Discharge Instruction: Cleanse the wound with wound cleanser prior to applying a clean dressing using gauze sponges, not tissue or cotton balls. Peri-Wound Care Topical keystone Discharge Instruction: Begin using topically to wounds once it gets sent to your house. Primary Dressing KerraCel Ag Gelling Fiber  Dressing, 4x5 in (silver alginate) Discharge Instruction: Apply silver alginate to wound bed as instructed Secondary Dressing Woven Gauze Sponges 2x2 in Discharge Instruction: Apply over primary dressing as directed. Secured With [employer] Surgical T 2x10 (in/yd) ape Discharge Instruction: Secure with tape as directed. Compression Wrap Compression Stockings Add-Ons Electronic Signature(s) Signed: 08/25/2021 4:57:18 PM By: Erenest Blank Signed: 08/25/2021 5:13:36 PM By: Lorrin Jackson Entered By: Erenest Blank on 08/25/2021 08:26:51 -------------------------------------------------------------------------------- Vitals Details Patient Name: Date of Service: Meline, [name] 08/25/2021 8:00 A M Medical Record Number: [id number] Patient Account Number: [id number] Date of Birth/Sex: Treating RN: 1955/12/09 (66 y.o. [name] Primary Care Mauricia Mertens: [name] Other Clinician: Referring Malaina Mortellaro: Treating Brannon Levene/Extender: [name] in Treatment: 3 Vital Signs Time Taken: 08:05 Temperature (F): 97.9 Height (in): 71 Pulse (bpm): 71 Weight (lbs): 350 Respiratory Rate (breaths/min): 18 Body Mass Index (BMI):  48.8 Blood Pressure (mmHg): 134/83 Reference Range: 80 - 120 mg / dl Electronic Signature(s) Signed: 08/25/2021 5:13:36 PM By: Lorrin Jackson Entered By: Lorrin Jackson on 08/25/2021 08:05:53

## 2021-08-26 NOTE — Progress Notes (Signed)
He is developing increasingly frequent and lengthy episodes of bradycardia that appeared to be due to severe paroxysmal AV block.  Periods of heart rate in the 30s are associated with hypotension.  We will go ahead with temporary transvenous pacemaker wire.

## 2021-08-26 NOTE — Progress Notes (Addendum)
Rt IJ venous sheath w/0.9NS at 10cc/hr. Temporary pacing wire rt IJ rate 50, MA 5, sensitivity 2, VVI, 45cm at entrance. 0.9NS infusing at 10cc/hr to left ac.

## 2021-08-26 NOTE — ED Notes (Signed)
Pt becoming increasingly hypotensive and bradycardic (25-35bpm). Cardiology paged and MD notified. Pt likely to be cath lab for temporary pacemaker.  Pt reports that he is still asymptomatic. Denies feeling lightheaded, short of breath, dizzy, or having chest pain. Bed placed in trendelenburg position.

## 2021-08-26 NOTE — ED Triage Notes (Signed)
Pt BIB [facility] from work c/o a near syncopal episode when EMS checked his prssure he was found to be hypotensive and brady on their monitor. Pt endorses some shoulder blade pain, SHOB and nausea.

## 2021-08-27 ENCOUNTER — Inpatient Hospital Stay (HOSPITAL_COMMUNITY): Payer: Medicare Other

## 2021-08-27 ENCOUNTER — Encounter (HOSPITAL_COMMUNITY)
Admission: EM | Disposition: A | Discharge status: Home Health | Payer: Self-pay | Source: Home / Self Care | Attending: Cardiovascular Disease

## 2021-08-27 ENCOUNTER — Encounter: Payer: Self-pay | Admitting: Internal Medicine

## 2021-08-27 ENCOUNTER — Ambulatory Visit (HOSPITAL_COMMUNITY): Admit: 2021-08-27 | Payer: Medicare Other | Admitting: Cardiology

## 2021-08-27 ENCOUNTER — Other Ambulatory Visit (HOSPITAL_COMMUNITY): Payer: Self-pay

## 2021-08-27 ENCOUNTER — Telehealth (HOSPITAL_COMMUNITY): Payer: Self-pay | Admitting: Pharmacy Technician

## 2021-08-27 ENCOUNTER — Encounter (HOSPITAL_COMMUNITY): Payer: Self-pay | Admitting: Internal Medicine

## 2021-08-27 DIAGNOSIS — E1169 Type 2 diabetes mellitus with other specified complication: Secondary | ICD-10-CM

## 2021-08-27 DIAGNOSIS — G4733 Obstructive sleep apnea (adult) (pediatric): Secondary | ICD-10-CM

## 2021-08-27 DIAGNOSIS — I700 Atherosclerosis of aorta: Secondary | ICD-10-CM

## 2021-08-27 DIAGNOSIS — I4439 Other atrioventricular block: Secondary | ICD-10-CM

## 2021-08-27 DIAGNOSIS — I5023 Acute on chronic systolic (congestive) heart failure: Secondary | ICD-10-CM

## 2021-08-27 DIAGNOSIS — I50812 Chronic right heart failure: Secondary | ICD-10-CM | POA: Diagnosis not present

## 2021-08-27 DIAGNOSIS — I5042 Chronic combined systolic (congestive) and diastolic (congestive) heart failure: Secondary | ICD-10-CM | POA: Diagnosis not present

## 2021-08-27 DIAGNOSIS — E669 Obesity, unspecified: Secondary | ICD-10-CM

## 2021-08-27 DIAGNOSIS — I4891 Unspecified atrial fibrillation: Secondary | ICD-10-CM

## 2021-08-27 DIAGNOSIS — N179 Acute kidney failure, unspecified: Secondary | ICD-10-CM

## 2021-08-27 HISTORY — PX: RIGHT/LEFT HEART CATH AND CORONARY ANGIOGRAPHY: CATH118266

## 2021-08-27 LAB — GLUCOSE, CAPILLARY
Glucose-Capillary: 80 mg/dL (ref 70–99)
Glucose-Capillary: 87 mg/dL (ref 70–99)
Glucose-Capillary: 91 mg/dL (ref 70–99)
Glucose-Capillary: 97 mg/dL (ref 70–99)

## 2021-08-27 LAB — POCT I-STAT EG7
Acid-Base Excess: 0 mmol/L (ref 0.0–2.0)
Acid-base deficit: 1 mmol/L (ref 0.0–2.0)
Bicarbonate: 25.5 mmol/L (ref 20.0–28.0)
Bicarbonate: 26.6 mmol/L (ref 20.0–28.0)
Calcium, Ion: 1.23 mmol/L (ref 1.15–1.40)
Calcium, Ion: 1.23 mmol/L (ref 1.15–1.40)
HCT: 38 % — ABNORMAL LOW (ref 39.0–52.0)
HCT: 39 % (ref 39.0–52.0)
Hemoglobin: 12.9 g/dL — ABNORMAL LOW (ref 13.0–17.0)
Hemoglobin: 13.3 g/dL (ref 13.0–17.0)
O2 Saturation: 67 %
O2 Saturation: 69 %
Potassium: 4.7 mmol/L (ref 3.5–5.1)
Potassium: 4.9 mmol/L (ref 3.5–5.1)
Sodium: 139 mmol/L (ref 135–145)
Sodium: 140 mmol/L (ref 135–145)
TCO2: 27 mmol/L (ref 22–32)
TCO2: 28 mmol/L (ref 22–32)
pCO2, Ven: 46.8 mmHg (ref 44–60)
pCO2, Ven: 50.3 mmHg (ref 44–60)
pH, Ven: 7.331 (ref 7.25–7.43)
pH, Ven: 7.345 (ref 7.25–7.43)
pO2, Ven: 37 mmHg (ref 32–45)
pO2, Ven: 39 mmHg (ref 32–45)

## 2021-08-27 LAB — MRSA NEXT GEN BY PCR, NASAL: MRSA by PCR Next Gen: DETECTED — AB

## 2021-08-27 LAB — BASIC METABOLIC PANEL
Anion gap: 12 (ref 5–15)
BUN: 25 mg/dL — ABNORMAL HIGH (ref 8–23)
CO2: 21 mmol/L — ABNORMAL LOW (ref 22–32)
Calcium: 9 mg/dL (ref 8.9–10.3)
Chloride: 102 mmol/L (ref 98–111)
Creatinine, Ser: 1.68 mg/dL — ABNORMAL HIGH (ref 0.61–1.24)
GFR, Estimated: 45 mL/min — ABNORMAL LOW (ref >60)
Glucose, Bld: 90 mg/dL (ref 70–99)
Potassium: 4.8 mmol/L (ref 3.5–5.1)
Sodium: 135 mmol/L (ref 135–145)

## 2021-08-27 LAB — POCT I-STAT 7, (LYTES, BLD GAS, ICA,H+H)
Acid-base deficit: 5 mmol/L — ABNORMAL HIGH (ref 0.0–2.0)
Bicarbonate: 20.8 mmol/L (ref 20.0–28.0)
Calcium, Ion: 0.84 mmol/L — CL (ref 1.15–1.40)
HCT: 33 % — ABNORMAL LOW (ref 39.0–52.0)
Hemoglobin: 11.2 g/dL — ABNORMAL LOW (ref 13.0–17.0)
O2 Saturation: 97 %
Potassium: 3.7 mmol/L (ref 3.5–5.1)
Sodium: 147 mmol/L — ABNORMAL HIGH (ref 135–145)
TCO2: 22 mmol/L (ref 22–32)
pCO2 arterial: 39.5 mmHg (ref 32–48)
pH, Arterial: 7.329 — ABNORMAL LOW (ref 7.35–7.45)
pO2, Arterial: 92 mmHg (ref 83–108)

## 2021-08-27 LAB — APTT: aPTT: 37 seconds — ABNORMAL HIGH (ref 24–36)

## 2021-08-27 LAB — HEPARIN LEVEL (UNFRACTIONATED): Heparin Unfractionated: >1.1 IU/mL — ABNORMAL HIGH (ref 0.30–0.70)

## 2021-08-27 SURGERY — RIGHT/LEFT HEART CATH AND CORONARY ANGIOGRAPHY
Anesthesia: LOCAL

## 2021-08-27 MED ORDER — SODIUM CHLORIDE 0.9% FLUSH
3.0000 mL | Freq: Two times a day (BID) | INTRAVENOUS | Status: DC
  Administered 2021-08-27 – 2021-09-03 (×12): 3 mL via INTRAVENOUS

## 2021-08-27 MED ORDER — SODIUM CHLORIDE 0.9% FLUSH: 3.0000 mL | INTRAVENOUS | Status: DC | PRN

## 2021-08-27 MED ORDER — MIDAZOLAM HCL 2 MG/2ML IJ SOLN
INTRAMUSCULAR | Status: DC | PRN
  Administered 2021-08-27: 2 mg via INTRAVENOUS

## 2021-08-27 MED ORDER — HEPARIN (PORCINE) IN NACL 1000-0.9 UT/500ML-% IV SOLN
INTRAVENOUS | Status: AC
Start: 2021-08-27 — End: ?
  Filled 2021-08-27: qty 1000

## 2021-08-27 MED ORDER — MUPIROCIN 2 % EX OINT
TOPICAL_OINTMENT | Freq: Two times a day (BID) | CUTANEOUS | Status: DC
  Administered 2021-08-31: 1 via NASAL
  Filled 2021-08-27: qty 22

## 2021-08-27 MED ORDER — SERTRALINE HCL 50 MG PO TABS
50.0000 mg | ORAL_TABLET | Freq: Every day | ORAL | Status: DC
  Administered 2021-08-27 – 2021-09-04 (×9): 50 mg via ORAL
  Filled 2021-08-27 (×8): qty 1

## 2021-08-27 MED ORDER — ASPIRIN 81 MG PO CHEW
81.0000 mg | CHEWABLE_TABLET | Freq: Every day | ORAL | Status: DC
  Administered 2021-08-27 – 2021-08-31 (×5): 81 mg via ORAL
  Filled 2021-08-27 (×5): qty 1

## 2021-08-27 MED ORDER — LIDOCAINE HCL (PF) 1 % IJ SOLN
INTRAMUSCULAR | Status: DC | PRN
  Administered 2021-08-27 (×2): 2 mL

## 2021-08-27 MED ORDER — FENTANYL CITRATE (PF) 100 MCG/2ML IJ SOLN
INTRAMUSCULAR | Status: DC | PRN
  Administered 2021-08-27: 25 ug via INTRAVENOUS

## 2021-08-27 MED ORDER — VERAPAMIL HCL 2.5 MG/ML IV SOLN
INTRAVENOUS | Status: AC
  Filled 2021-08-27: qty 2

## 2021-08-27 MED ORDER — HEPARIN (PORCINE) 25000 UT/250ML-% IV SOLN
1450.0000 [IU]/h | INTRAVENOUS | Status: DC
  Administered 2021-08-27: 1300 [IU]/h via INTRAVENOUS
  Filled 2021-08-27 (×2): qty 250

## 2021-08-27 MED ORDER — SODIUM CHLORIDE 0.9 % IV SOLN: INTRAVENOUS | Status: DC

## 2021-08-27 MED ORDER — LIDOCAINE HCL (PF) 1 % IJ SOLN
INTRAMUSCULAR | Status: AC
  Filled 2021-08-27: qty 30

## 2021-08-27 MED ORDER — HEPARIN (PORCINE) IN NACL 1000-0.9 UT/500ML-% IV SOLN
INTRAVENOUS | Status: DC | PRN
  Administered 2021-08-27 (×2): 500 mL

## 2021-08-27 MED ORDER — SODIUM CHLORIDE 0.9 % IV SOLN
INTRAVENOUS | Status: AC | PRN
  Administered 2021-08-27: 10 mL/h via INTRAVENOUS

## 2021-08-27 MED ORDER — CHLORHEXIDINE GLUCONATE CLOTH 2 % EX PADS
6.0000 | MEDICATED_PAD | Freq: Every day | CUTANEOUS | Status: DC
  Administered 2021-08-27 – 2021-09-03 (×9): 6 via TOPICAL

## 2021-08-27 MED ORDER — LABETALOL HCL 5 MG/ML IV SOLN: 10.0000 mg | INTRAVENOUS | Status: AC | PRN

## 2021-08-27 MED ORDER — ONDANSETRON HCL 4 MG/2ML IJ SOLN: 4.0000 mg | Freq: Four times a day (QID) | INTRAMUSCULAR | Status: DC | PRN

## 2021-08-27 MED ORDER — VERAPAMIL HCL 2.5 MG/ML IV SOLN
INTRAVENOUS | Status: DC | PRN
  Administered 2021-08-27: 10 mL via INTRA_ARTERIAL

## 2021-08-27 MED ORDER — ATORVASTATIN CALCIUM 10 MG PO TABS
20.0000 mg | ORAL_TABLET | Freq: Every day | ORAL | Status: DC
  Administered 2021-08-28: 20 mg via ORAL
  Filled 2021-08-27: qty 2

## 2021-08-27 MED ORDER — SODIUM CHLORIDE 0.9 % IV SOLN
250.0000 mL | INTRAVENOUS | Status: DC | PRN
  Administered 2021-08-27 – 2021-09-02 (×2): 250 mL via INTRAVENOUS

## 2021-08-27 MED ORDER — HYDRALAZINE HCL 20 MG/ML IJ SOLN: 10.0000 mg | INTRAMUSCULAR | Status: AC | PRN

## 2021-08-27 MED ORDER — LEVOFLOXACIN 750 MG PO TABS
750.0000 mg | ORAL_TABLET | Freq: Every day | ORAL | Status: DC
  Administered 2021-08-27 – 2021-09-03 (×7): 750 mg via ORAL
  Filled 2021-08-27 (×9): qty 1

## 2021-08-27 MED ORDER — FENTANYL CITRATE (PF) 100 MCG/2ML IJ SOLN
INTRAMUSCULAR | Status: AC
  Filled 2021-08-27: qty 2

## 2021-08-27 MED ORDER — HEPARIN SODIUM (PORCINE) 1000 UNIT/ML IJ SOLN
INTRAMUSCULAR | Status: AC
  Filled 2021-08-27: qty 10

## 2021-08-27 MED ORDER — ACETAMINOPHEN 325 MG PO TABS
650.0000 mg | ORAL_TABLET | ORAL | Status: DC | PRN
  Administered 2021-08-28 – 2021-08-31 (×5): 650 mg via ORAL
  Filled 2021-08-27 (×5): qty 2

## 2021-08-27 MED ORDER — MIDAZOLAM HCL 2 MG/2ML IJ SOLN
INTRAMUSCULAR | Status: AC
  Filled 2021-08-27: qty 2

## 2021-08-27 MED ORDER — IOHEXOL 350 MG/ML SOLN
INTRAVENOUS | Status: DC | PRN
  Administered 2021-08-27: 25 mL

## 2021-08-27 MED ORDER — HEPARIN SODIUM (PORCINE) 1000 UNIT/ML IJ SOLN
INTRAMUSCULAR | Status: DC | PRN
  Administered 2021-08-27: 7000 [IU] via INTRAVENOUS

## 2021-08-27 SURGICAL SUPPLY — 12 items
BAND CMPR LRG ZPHR (HEMOSTASIS) ×1
BAND ZEPHYR COMPRESS 30 LONG (HEMOSTASIS) IMPLANT
CATH BALLN WEDGE 5F 110CM (CATHETERS) IMPLANT
CATH INFINITI 5FR MULTPACK ANG (CATHETERS) IMPLANT
GLIDESHEATH SLEND SS 6F .021 (SHEATH) IMPLANT
GUIDEWIRE .025 260CM (WIRE) IMPLANT
GUIDEWIRE INQWIRE 1.5J.035X260 (WIRE) IMPLANT
INQWIRE 1.5J .035X260CM (WIRE) ×1
PACK CARDIAC CATHETERIZATION (CUSTOM PROCEDURE TRAY) ×2 IMPLANT
SHEATH GLIDE SLENDER 4/5FR (SHEATH) IMPLANT
TRANSDUCER W/STOPCOCK (MISCELLANEOUS) ×2 IMPLANT
WIRE HI TORQ VERSACORE-J 145CM (WIRE) IMPLANT

## 2021-08-27 NOTE — Progress Notes (Signed)
ANTICOAGULATION CONSULT NOTE - Initial Consult  Pharmacy Consult for IV Heparin Indication: atrial fibrillation  Allergies  Allergen Reactions   Latex Hives and Swelling   Ace Inhibitors     Other reaction(s): cough   Penicillin G     Other reaction(s): rash Has tolerated amoxicillin    Patient Measurements: Height: '5\' 11"'$  (180.3 cm) Weight: (!) 161.9 kg (356 lb 14.8 oz) IBW/kg (Calculated) : 75.3 Heparin Dosing Weight: 111.6 kg  Vital Signs: Temp: 97.6 F (36.4 C) (08/18 1600) Temp Source: Oral (08/18 1600) BP: 139/59 (08/18 1800) Pulse Rate: 50 (08/18 1800)  Labs: Recent Labs    08/26/21 1232 08/26/21 1253 08/26/21 1546 08/27/21 0633 08/27/21 1521 08/27/21 1528  HGB 13.9 13.0  --   --  11.2* 12.9*  13.3  HCT 41.0 41.3  --   --  33.0* 38.0*  39.0  PLT  --  129*  --   --   --   --   CREATININE 1.50* 1.51*  --  1.68*  --   --   TROPONINIHS  --  17 18*  --   --   --     Estimated Creatinine Clearance: 67.2 mL/min (A) (by C-G formula based on SCr of 1.68 mg/dL (H)).   Medical History: Past Medical History:  Diagnosis Date   Acute systolic HF (heart failure) (HCC)    Arthritis    lt foot, hips knees    Atrial fibrillation (HCC)    Diabetes mellitus, new onset ([place])    Diverticulosis    Dyslipidemia    Gout attack 01/16/2012   Hypertension    Internal hemorrhoids    Lymphoma ([place])    remission for about 2 years, chemo 3 years prior   Pneumonia due to COVID-19 virus 07/2018   Pulmonary hypertension ([place])    Sepsis ([place]) 02/2017   secondary to influenza; requiring trach   Tubular adenoma of colon    Venous stasis ulcers ([place])     Assessment: 66 years of age male on Apixaban prior to admission for atrial fibrillation. Last dose of Apixaban was on 8/17 AM. Pharmacy consulted post cath to start IV Heparin 2 hr after TR band removal.   TR band down to 0 at 17:15. Will start.  Goal of Therapy:  Heparin level 0.3-0.7 units/ml aPTT 66-102  seconds Monitor platelets by anticoagulation protocol: Yes   Plan:  aPTT and Heparin level baseline - due to recent Apixaban.  Start Heparin at 1300 units/hr.  Check heparin level and aPTT.  Daily heparin level, aPTT and CBC.   Sloan Leiter, PharmD, BCPS, BCCCP Clinical Pharmacist Please refer to [facility] for [place] numbers 08/27/2021,7:37 PM

## 2021-08-27 NOTE — H&P (View-Only) (Signed)
Advanced Heart Failure Team Consult Note   Primary Physician: Ginger Organ., MD PCP-Cardiologist:  Quay Burow, MD  Reason for Consultation: acute on chronic systolic heart failure   HPI:    [name] is seen today for evaluation of acute on chronic systolic heart failure at the request of Dr. Sallyanne Kuster.   66 y/o male w/ h/o chronic systolic heart failure, chronic afib, OSA, HTN, HLD, type 2DM, OSA on CPAP, tobacco use and venous stasis ulcers, followed by wound clinic. Also history of chemotherapy with potential cardiotoxic effects (R-CHOP for lymphoma in 2011).   Admitted in 02/2017 for acute hypoxic respiratory failure requiring intubation and ultimately tracheostomy. This was in the setting of multifocal PNA/ influenza and new onset atrial fibrillation w/ RVR. Echo showed mod LVH and severely reduced LVEF 25-30% w/ diffuse HK. RV normal. At the time, his CM was felt to be viral. Did not get cath. Per d/c summary, DCCV not pursued. Afib treated w/ rate control and Eliquis.   He followed by cardiology post hospital. There was discussion regarding outpatient DCCV, but I don't see that this was every perused.   Repeat echo 07/2017 EF improved to 50-55%, RV mildly dilated w/ mildly reduced systolic function. He was noted to be in afib at the time of study.   08/2017, wore Zio that showed Afib, well rated controlled.   Echo 11/2018 EF 45-50%, RV mildly reduced   Unfortunately, lost to f/u by cardiology for a period of time. Had been seeing [facility] for PAD, s/p left transmetatarsal amputation. Recently saw PCP and complained of increased dyspnea and edema. Referred back to cardiology 7/23.  Torsemide was increased and he was ordered to get repeat echo that was completed on 08/23/2021 and showed EF 30-35%, severe LVH, moderately reduced RV systolic function, moderate TVR. EKG showed atrial fibrillation, right bundle branch block, heart rate 73 bpm.  On 8/17, pt presented to ED given  presyncopal spell while at work. Got lightheaded, dizzy and proudly weak. No LOC. Denies CP.  Found to be hypotensive and bradycardic. Labs in the ED showed Na 137, K 5.0, creatinine 1.51 (b/l < 1.0), mag 1.8, WBC 6.9, hemoglobin 13.0, platelets 129. Initial hsTn 17>>18. BNP 655.    Initial EKG showed atrial fibrillation w/ SVR, HR 28 BPM, RBBB. Follow up EKG showed atrial fibrillation, HR 53 BPM, RBBB. Tele showed several 2 sec pauses  He was admitted. Coreg held given bradycardia. Losartan and torsemide held w/ AKI and hypotension.   He developed worsening periods of high-grade AV block/atrial fibrillation with slow ventricular response and lengthy pauses w/ associated hypotension. Underwent placement of a temporary transvenous pacemaker via right IJ approach last night with improvement in symptoms and blood pressure.  Feels better today. Denies resting dyspnea. TSH elevated 5.8, Free T4 elevated 2.21   Scheduled for [facility] today. [facility] team consulted.   SCr higher, 1.51>>1.68. Denies CP    Echo 08/23/21 AFIB with occasional pauses noted. Left ventricular ejection fraction, by estimation, is 30 to 35%. The left ventricle has moderately decreased function. The left ventricle demonstrates global hypokinesis. There is severe left ventricular hypertrophy. Left ventricular diastolic parameters were normal. 1. Right ventricular systolic function is moderately reduced. The right ventricular size is moderately enlarged. There is mildly elevated pulmonary artery systolic pressure. The estimated right ventricular systolic pressure is 57.3 mmHg. 2. 3. Left atrial size was mildly dilated. 4. Right atrial size was mildly dilated. The mitral valve is normal in  structure. Mild mitral valve regurgitation. No evidence of mitral stenosis. 5. 6. Tricuspid valve regurgitation is moderate. The aortic valve is tricuspid. There is mild calcification of the aortic valve. There is mild thickening of the aortic  valve. Aortic valve regurgitation is not visualized. Aortic valve sclerosis is present, with no evidence of aortic valve stenosis. 7. The inferior vena cava is dilated in size with >50% respiratory variability, suggesting right atrial pressure of 8 mmHg.  Review of Systems: [y] = yes, '[ ]'$  = no   General: Weight gain '[ ]'$ ; Weight loss '[ ]'$ ; Anorexia '[ ]'$ ; Fatigue '[ ]'$ ; Fever '[ ]'$ ; Chills '[ ]'$ ; Weakness [ Y]  Cardiac: Chest pain/pressure '[ ]'$ ; Resting SOB '[ ]'$ ; Exertional SOB [Y ]; Orthopnea '[ ]'$ ; Pedal Edema '[ ]'$ ; Palpitations '[ ]'$ ; Syncope '[ ]'$ ; Presyncope [ Y]; Paroxysmal nocturnal dyspnea'[ ]'$   Pulmonary: Cough '[ ]'$ ; Wheezing'[ ]'$ ; Hemoptysis'[ ]'$ ; Sputum '[ ]'$ ; Snoring '[ ]'$   GI: Vomiting'[ ]'$ ; Dysphagia'[ ]'$ ; Melena'[ ]'$ ; Hematochezia '[ ]'$ ; Heartburn'[ ]'$ ; Abdominal pain '[ ]'$ ; Constipation '[ ]'$ ; Diarrhea '[ ]'$ ; BRBPR '[ ]'$   GU: Hematuria'[ ]'$ ; Dysuria '[ ]'$ ; Nocturia'[ ]'$   Vascular: Pain in legs with walking '[ ]'$ ; Pain in feet with lying flat '[ ]'$ ; Non-healing sores [Y ]; Stroke '[ ]'$ ; TIA '[ ]'$ ; Slurred speech '[ ]'$ ;  Neuro: Headaches'[ ]'$ ; Vertigo'[ ]'$ ; Seizures'[ ]'$ ; Paresthesias'[ ]'$ ;Blurred vision '[ ]'$ ; Diplopia '[ ]'$ ; Vision changes '[ ]'$   Ortho/Skin: Arthritis '[ ]'$ ; Joint pain '[ ]'$ ; Muscle pain '[ ]'$ ; Joint swelling '[ ]'$ ; Back Pain '[ ]'$ ; Rash '[ ]'$   Psych: Depression'[ ]'$ ; Anxiety'[ ]'$   Heme: Bleeding problems '[ ]'$ ; Clotting disorders '[ ]'$ ; Anemia '[ ]'$   Endocrine: Diabetes [Y ]; Thyroid dysfunction[Y ]  Home Medications Prior to Admission medications   Medication Sig Start Date End Date Taking? Authorizing Provider  acetaminophen (TYLENOL) 325 MG tablet Take 650 mg by mouth every 6 (six) hours as needed for moderate pain.    Yes [provider]  ascorbic acid (VITAMIN C) 500 MG tablet Take 1 tablet (500 mg total) by mouth 3 (three) times daily. Patient taking differently: Take 500 mg by mouth 2 (two) times daily. 08/26/20  Yes Angiulli, Lavon Paganini, PA-C  atorvastatin (LIPITOR) 20 MG tablet TAKE 1 TABLET BY MOUTH EVERY DAY *PT OVERDUE FOR OFFICE VISIT*  08/26/20  Yes Angiulli, Lavon Paganini, PA-C  cholecalciferol (VITAMIN D) 25 MCG (1000 UNIT) tablet Take 1 tablet (1,000 Units total) by mouth daily. 08/26/20  Yes Angiulli, Lavon Paganini, PA-C  colchicine 0.6 MG tablet Take 0.6 mg by mouth 2 (two) times daily as needed.   Yes [provider]  ELIQUIS 5 MG TABS tablet TAKE 1 TABLET BY MOUTH TWICE A DAY 05/04/21  Yes Hochrein, Jeneen Rinks, MD  KLOR-CON M20 20 MEQ tablet Take 1 tablet (20 mEq total) by mouth daily. 08/04/21  Yes Loel Dubonnet, NP  levofloxacin (LEVAQUIN) 750 MG tablet Take 750 mg by mouth daily. 08/12/21  Yes [provider]  losartan (COZAAR) 50 MG tablet Take 1 tablet (50 mg total) by mouth daily. 08/24/21  Yes Lorretta Harp, MD  Omega 3 340 MG CPDR Take 1 capsule (340 mg total) by mouth 2 (two) times daily. Patient taking differently: Take 340 mg by mouth daily. 08/26/20  Yes Angiulli, Lavon Paganini, PA-C  omeprazole (PRILOSEC) 40 MG capsule Take 1 capsule (40 mg total) by mouth daily. 08/26/20  Yes Angiulli, Lavon Paganini, PA-C  SANTYL 250 UNIT/GM ointment  APPLY 1 APPLICATION. TOPICALLY DAILY. 07/05/21  Yes Trula Slade, DPM  sertraline (ZOLOFT) 50 MG tablet Take 1 tablet (50 mg total) by mouth daily. 08/26/20  Yes Angiulli, Lavon Paganini, PA-C  Tirzepatide [facility]) Inject 1 Dose into the skin once a week.   Yes [provider]  torsemide (DEMADEX) 20 MG tablet Take 2 tablets (40 mg total) by mouth 2 (two) times daily. Patient taking differently: Take 40 mg by mouth daily. 08/02/21  Yes Loel Dubonnet, NP  vitamin B-12 (CYANOCOBALAMIN) 1000 MCG tablet Take 1 tablet (1,000 mcg total) by mouth daily. 08/26/20  Yes Angiulli, Lavon Paganini, PA-C  albuterol (VENTOLIN HFA) 108 (90 Base) MCG/ACT inhaler Inhale 2 puffs into the lungs every 6 (six) hours as needed for wheezing or shortness of breath. Patient not taking: Reported on 08/26/2021 08/26/20   Angiulli, Lavon Paganini, PA-C  Continuous Blood Gluc Receiver (FREESTYLE LIBRE 2 READER) DEVI  1 each by Does not apply route daily. 10/05/20   Raulkar, Clide Deutscher, MD  Continuous Blood Gluc Sensor (FREESTYLE LIBRE 2 SENSOR) MISC 1 each by Does not apply route daily. 10/05/20   Raulkar, Clide Deutscher, MD  polycarbophil (FIBERCON) 625 MG tablet Take 1 tablet (625 mg total) by mouth daily. Patient not taking: Reported on 08/26/2021 08/26/20   Angiulli, Lavon Paganini, PA-C  sacubitril-valsartan (ENTRESTO) 24-26 MG Take 1 tablet by mouth 2 (two) times daily. 08/04/21   Loel Dubonnet, NP  carvedilol (COREG) 6.25 MG tablet Take 6.25 mg by mouth 2 (two) times daily. 09/13/20 10/05/20  [provider]  sodium chloride (OCEAN) 0.65 % SOLN nasal spray Place 2 sprays into both nostrils as needed for congestion. 04/05/17 10/05/20  Bary Leriche, PA-C    Past Medical History: Past Medical History:  Diagnosis Date   Acute systolic HF (heart failure) (HCC)    Arthritis    lt foot, hips knees    Atrial fibrillation (HCC)    Diabetes mellitus, new onset ([place])    Diverticulosis    Dyslipidemia    Gout attack 01/16/2012   Hypertension    Internal hemorrhoids    Lymphoma ([place])    remission for about 2 years, chemo 3 years prior   Pneumonia due to COVID-19 virus 07/2018   Pulmonary hypertension ([place])    Sepsis ([place]) 02/2017   secondary to influenza; requiring trach   Tubular adenoma of colon    Venous stasis ulcers ([place])     Past Surgical History: Past Surgical History:  Procedure Laterality Date   ABDOMINAL AORTOGRAM W/LOWER EXTREMITY N/A 08/14/2020   Procedure: ABDOMINAL AORTOGRAM W/LOWER EXTREMITY;  Surgeon: Elam Dutch, MD;  Location: [place] [facility];  Service: Cardiovascular;  Laterality: N/A;   AMPUTATION TOE Left 05/08/2019   Procedure: AMPUTATION LEFT GREAT  TOE;  Surgeon: Trula Slade, DPM;  Location: [facility];  Service: Podiatry;  Laterality: Left;   ARTERIAL LINE INSERTION Right 08/26/2021   Procedure: ARTERIAL LINE INSERTION;  Surgeon: Early Osmond, MD;  Location: [place] [facility];  Service: Cardiovascular;  Laterality: Right;  rt radial   BRONCHOSCOPY     ESOPHAGOGASTRODUODENOSCOPY ENDOSCOPY     Dr Royden Purl HERNIA REPAIR Left    2001   IR REMOVAL TUN ACCESS W/ PORT W/O FL MOD SED  02/03/2020   PORTA CATH INSERTION  2011   TEMPORARY PACEMAKER N/A 08/26/2021   Procedure: TEMPORARY PACEMAKER;  Surgeon: Early Osmond, MD;  Location: [place] [facility];  Service: Cardiovascular;  Laterality: N/A;   TRACHEOSTOMY  03/2017   with decannulation   TRANSMETATARSAL AMPUTATION Left 08/05/2020   Procedure: TRANSMETATARSAL AMPUTATION;  Surgeon: Trula Slade, DPM;  Location: [place];  Service: Podiatry;  Laterality: Left;    Family History: Family History  Problem Relation Age of Onset   Dementia Brother 70       frontal lobe    Dementia Mother    Arthritis Mother    Hypertension Mother    CVA Sister 94   Colon cancer Neg Hx    Pancreatic cancer Neg Hx    Stomach cancer Neg Hx    Rectal cancer Neg Hx    Liver cancer Neg Hx     Social History: Social History   Socioeconomic History   Marital status: Divorced    Spouse name: Not on file   Number of children: Not on file   Years of education: Not on file   Highest education level: Not on file  Occupational History   Occupation: cosmetologist  Tobacco Use   Smoking status: Former    Packs/day: 0.50    Years: 20.00    Total pack years: 10.00    Types: Cigarettes    Quit date: 01/10/2009    Years since quitting: 12.6   Smokeless tobacco: Never  Vaping Use   Vaping Use: Never used  Substance and Sexual Activity   Alcohol use: Not Currently    Comment: 2-3 times per week   Drug use: No   Sexual activity: Not on file  Other Topics Concern   Not on file  Social History Narrative   Not on file   Social Determinants of Health   Financial Resource Strain: Not on file  Food Insecurity: Not on file  Transportation Needs: Not on file  Physical Activity: Not on file  Stress: Not  on file  Social Connections: Not on file    Allergies:  Allergies  Allergen Reactions   Latex Hives and Swelling   Ace Inhibitors     Other reaction(s): cough   Penicillin G     Other reaction(s): rash    Objective:    Vital Signs:   Temp:  [97.9 F (36.6 C)-98.4 F (36.9 C)] 97.9 F (36.6 C) (08/18 0800) Pulse Rate:  [25-144] 56 (08/18 0600) Resp:  [9-21] 13 (08/18 0600) BP: (40-146)/(25-124) 130/76 (08/18 0600) SpO2:  [77 %-100 %] 96 % (08/18 0600) Arterial Line BP: (85-134)/(43-88) 92/78 (08/18 0400) Weight:  [161.9 kg] 161.9 kg (08/18 0600) Last BM Date : 08/26/21  Weight change: Filed Weights   08/27/21 0600  Weight: (!) 161.9 kg    Intake/Output:   Intake/Output Summary (Last 24 hours) at 08/27/2021 0912 Last data filed at 08/27/2021 0600 Gross per 24 hour  Intake --  Output 500 ml  Net -500 ml      Physical Exam    General:  morbidly obese. No resp difficulty HEENT: normal Neck: supple. Thick neck, JVP not visualized . Carotids 2+ bilat; no bruits. No lymphadenopathy or thyromegaly appreciated. + rt IJ TVP  Cor: PMI nondisplaced. Regular rate & rhythm. No rubs, gallops or murmurs. Lungs: clear Abdomen: obese, soft, nontender, nondistended. No hepatosplenomegaly. No bruits or masses. Good bowel sounds. Extremities: no cyanosis, clubbing, rash, trace b/l LE edema + unna boots  Neuro: alert & orientedx3, cranial nerves grossly intact. moves all 4 extremities w/o difficulty. Affect pleasant   Telemetry   Afib 50s, paced   EKG  Afib w/ SVR 28 bpm w/ RBBB   Labs   Basic Metabolic Panel: Recent Labs  Lab 08/24/21 1546 08/26/21 1232 08/26/21 1253 08/27/21 0633  NA 141 136 137 135  K 4.6 4.8 5.0 4.8  CL 102 104 105 102  CO2 24  --  22 21*  GLUCOSE 82 104* 109* 90  BUN '19 21 20 '$ 25*  CREATININE 1.22 1.50* 1.51* 1.68*  CALCIUM 9.2  --  8.9 9.0  MG  --   --  1.8  --   PHOS  --   --  4.1  --     Liver Function Tests: Recent Labs  Lab  08/26/21 1253  AST 21  ALT 10  ALKPHOS 120  BILITOT 1.8*  PROT 6.8  ALBUMIN 3.3*   No results for input(s): "LIPASE", "AMYLASE" in the last 168 hours. No results for input(s): "AMMONIA" in the last 168 hours.  CBC: Recent Labs  Lab 08/26/21 1232 08/26/21 1253  WBC  --  6.9  NEUTROABS  --  5.1  HGB 13.9 13.0  HCT 41.0 41.3  MCV  --  95.4  PLT  --  129*    Cardiac Enzymes: No results for input(s): "CKTOTAL", "CKMB", "CKMBINDEX", "TROPONINI" in the last 168 hours.  BNP: BNP (last 3 results) Recent Labs    08/02/21 1150 08/26/21 1259  BNP 475.7* 655.7*    ProBNP (last 3 results) No results for input(s): "PROBNP" in the last 8760 hours.   CBG: Recent Labs  Lab 08/26/21 1819 08/26/21 2136 08/27/21 0051  GLUCAP 97 95 87    Coagulation Studies: No results for input(s): "LABPROT", "INR" in the last 72 hours.   Imaging   DG CHEST PORT 1 VIEW  Result Date: 08/27/2021 CLINICAL DATA:  Heart block, temporary pacemaker EXAM: PORTABLE CHEST 1 VIEW COMPARISON:  08/26/2021 FINDINGS: Single frontal view of the chest demonstrates right internal jugular transvenous pacer, tip overlying region of the right ventricle. Cardiac silhouette is enlarged but stable. Continued central vascular congestion, without focal consolidation, effusion, or pneumothorax. External defibrillator pads overlie the left chest. IMPRESSION: 1. Transvenous pacer via right internal jugular approach tip overlying the right ventricle. 2. Continued central vascular congestion without overt edema. Electronically Signed   By: Randa Ngo M.D.   On: 08/27/2021 08:20   CARDIAC CATHETERIZATION  Result Date: 08/27/2021 1.  Successful temporary pacemaker placement with a backup rate of 50 bpm, an output of 10 mA, and a threshold of 0.6 mA. 2.  Successful right radial arterial line. Recommendation: Continued observation in the 2 Heart unit and EP consultation in the morning.   DG CHEST PORT 1 VIEW  Result  Date: 08/26/2021 CLINICAL DATA:  Near syncopal episode.  CHF. EXAM: PORTABLE CHEST 1 VIEW COMPARISON:  07/22/2020 FINDINGS: Cardiac pads overlying the left side of the chest. Low lung volumes. Heart is enlarged for size. Again noted are prominent central vascular markings. Slightly prominent interstitial markings in right suprahilar region appear chronic. Negative for a pneumothorax. IMPRESSION: 1. Cardiomegaly with low lung volumes. 2. Prominent central vascular markings without overt pulmonary edema. Electronically Signed   By: Markus Daft M.D.   On: 08/26/2021 14:49     Medications:     Current Medications:  aspirin  81 mg Oral Daily   atorvastatin  20 mg Oral Daily   Chlorhexidine Gluconate Cloth  6 each Topical Daily   heparin  5,000 Units Subcutaneous Q8H   insulin aspart  0-15 Units Subcutaneous TID WC  levofloxacin  750 mg Oral Daily   sertraline  50 mg Oral Daily   sodium chloride flush  3 mL Intravenous Q12H   sodium chloride flush  3 mL Intravenous Q12H    Infusions:  sodium chloride        Patient Profile   66 y/o male w/ h/o HFrEF>>HFimEF, chronic afib, OSA on CPAP, HTN, HLD, type 2DM, tobacco use, PAD and chronic venous stasis ulcers, followed by wound clinic. Also history of chemotherapy with potential cardiotoxic effects (R-CHOP for lymphoma in 2011). Admitted for near syncope, hypotension and profound bradycardia, requiring TVP. Recent outpatient echo showed drop in EF, 30-35%, severe LVH, moderately reduced RV systolic function, moderate TVR.  Assessment/Plan   Acute on Chronic Systolic Heart Failure - Echo 02/2017 EF 25-30% w/ diffuse HK. RV normal. Felt to be viral CM +/- component of tachymediated in setting of influenza PNA and rapid Afib  - Echo 07/2017 EF recovered, 50-55%, RV mildly reduced - Echo 11/2018 EF 45-50%, RV mildly reduced (in rate controlled afib)  - Echo 08/23/21 EF 30-35%, severe LVH, moderately reduced RV systolic function, moderate TVR - NYHA  Class II-III, confounded by obesity  - volume assessment difficult on exam - Plan R/LHC today to exclude underlying CAD, assess filling pressures and CO  - given concomitant conduction disease, profound bradycardia and LVH, consider Amyloid w/u  - GDMT currently limited by AKI and recent hypotension   2. Profound Bradycardia w/ High-grade AV block - s/p placement of TVP  - off coreg - has known RBBB  - Hs trop not c/w ACS but multiple risk factors for ischemic HD + evidence of coronary calcifications. Plan LHC to exclude CAD/ high grade RCA disease   - if PPM needed, may need to consider leadless pacer given chronic LEE wounds.   3. Chronic Afib - dates back to at least 2019, has been historically well rate controlled - eliquis PTA, holding for planned cath  - has never had attempt at [place]. Given chronicity , tx w/ rhythm control at this point may be difficult  - size may prohibit ablation  - continue tx of OSA w/ CPAP   4. AKI  - b/w Scr 0.9 - 1.51 on admit, >>1.68 today  - suspect 2/2 hypotension/ bradycardia - BP/HR now improved w/ TVP - plan RHC to assess CO  - follow BMP   4. PAD/ Chronic Venous Stasis Wounds  - s/p left transmetatarsal amputation - followed by [facility]VS and Wound clinic  - on levofloxacin 750 mg qd   5. H/o Lymphoma - s/p R-CHOP in 2011 (potential cardiotoxic effects)   6. Type 2DM - Hgb A1c 6.0    Length of Stay: 1  Brittainy Simmons, PA-C  08/27/2021, 9:12 AM  Advanced Heart Failure Team Pager [phone] (M-F; 7a - 5p)  Please contact [facility] for night-coverage after hours (4p -7a ) and weekends on [email or web address]  Agree with above.  66 yo male with obesity, OSA, HTN, DM2, chronic AF, chronic LE edema and previous lymphoma s/p R-CHOP 2011  Now admitted with recurrent HF c/b CHB requiring TVP. Hstrop negative.   SOB improved. No CP.   General:  Obese male lying in bed  No resp difficulty HEENT: normal Neck: supple. JVP to jaw Carotids 2+  bilat; no bruits. No lymphadenopathy or thryomegaly appreciated. Cor: PMI nondisplaced. Regular rate & rhythm. No rubs, gallops or murmurs. Lungs: clear Abdomen: obese soft, nontender, nondistended. No hepatosplenomegaly. No bruits or masses. Good bowel sounds.  Extremities: no cyanosis, clubbing, rash, 2+ edema with UNNA Neuro: alert & orientedx3, cranial nerves grossly intact. moves all 4 extremities w/o difficulty. Affect pleasant  He is at high risk for severe underlying CAD yet I am not sure that is driving his current presentation. Concern also for possible infiltrative CM.   Agree with plan for R/L cath today followed by cMRI if able. Will likely need biv pacing.   D/w Dr. Sallyanne Kuster  CRITICAL CARE Performed by: Glori Bickers  Total critical care time: 35 minutes  Critical care time was exclusive of separately billable procedures and treating other patients.  Critical care was necessary to treat or prevent imminent or life-threatening deterioration.  Critical care was time spent personally by me (independent of midlevel providers or residents) on the following activities: development of treatment plan with patient and/or surrogate as well as nursing, discussions with consultants, evaluation of patient's response to treatment, examination of patient, obtaining history from patient or surrogate, ordering and performing treatments and interventions, ordering and review of laboratory studies, ordering and review of radiographic studies, pulse oximetry and re-evaluation of patient's condition.  Glori Bickers, MD  11:39 AM

## 2021-08-27 NOTE — Telephone Encounter (Signed)
Pharmacy Patient Advocate Encounter  Insurance verification completed.    The patient is insured through [facility] Part D   The patient is currently admitted and ran test claims for the following: Delene Loll, Farxiga, Jardiance.  Copays and coinsurance results were relayed to Inpatient clinical team.

## 2021-08-27 NOTE — Interval H&P Note (Signed)
History and Physical Interval Note:  08/27/2021 3:09 PM  [name]  has presented today for surgery, with the diagnosis of systolic heart failure.  The various methods of treatment have been discussed with the patient and family. After consideration of risks, benefits and other options for treatment, the patient has consented to  Procedure(s): RIGHT/LEFT HEART CATH AND CORONARY ANGIOGRAPHY (N/A) and possible coronary angioplasty as a surgical intervention.  The patient's history has been reviewed, patient examined, no change in status, stable for surgery.  I have reviewed the patient's chart and labs.  Questions were answered to the patient's satisfaction.     Diron Haddon

## 2021-08-27 NOTE — Progress Notes (Signed)
PEDER ALLUMS ([id number]) , Visit Report for 08/23/2021 Arrival Information Details Patient Name: Date of Service: MALAKHI, MARKWOOD 08/23/2021 3:45 PM Medical Record Number: [id number] Patient Account Number: [id number] Date of Birth/Sex: Treating RN: 05-17-1955 (66 y.o. [name], [name] Primary Care Cyndra Feinberg: [name] Other Clinician: Referring Elliott Lasecki: Treating Everard Interrante/Extender: Lucky Rathke in Treatment: 2 Visit Information History Since Last Visit Added or deleted any medications: No Patient Arrived: Wheel Chair Any new allergies or adverse reactions: No Arrival Time: 15:45 Had a fall or experienced change in No Accompanied By: friend activities of daily living that may affect Transfer Assistance: None risk of falls: Patient Identification Verified: Yes Signs or symptoms of abuse/neglect since last visito No Secondary Verification Process Completed: Yes Hospitalized since last visit: No Patient Requires Transmission-Based Precautions: No Implantable device outside of the clinic excluding No Patient Has Alerts: Yes cellular tissue based products placed in the center Patient Alerts: Patient on Blood Thinner since last visit: 01/2021 ABI L 1.13 R 1.06 Has Dressing in Place as Prescribed: Yes 01/2021 TBI L amp R 0.8 Has Compression in Place as Prescribed: Yes Pain Present Now: No Electronic Signature(s) Signed: 08/23/2021 4:35:20 PM By: Deon Pilling RN, BSN Entered By: Deon Pilling on 08/23/2021 16:28:29 -------------------------------------------------------------------------------- Compression Therapy Details Patient Name: Date of Service: Eplin, [name] 08/23/2021 3:45 PM Medical Record Number: [id number] Patient Account Number: [id number] Date of Birth/Sex: Treating RN: 09-16-1955 (66 y.o. [name] Primary Care Taneia Mealor: [name] Other Clinician: Referring Aziz Slape: Treating Carmichael Burdette/Extender: Lucky Rathke in Treatment: 2 Compression Therapy Performed for Wound Assessment: Wound #11 Left,Medial Lower Leg Performed By: Clinician Deon Pilling, RN Compression Type: Three Hydrologist) Signed: 08/23/2021 4:35:20 PM By: Deon Pilling RN, BSN Entered By: Deon Pilling on 08/23/2021 16:29:21 -------------------------------------------------------------------------------- Compression Therapy Details Patient Name: Date of Service: Dahan, [name] 08/23/2021 3:45 PM Medical Record Number: [id number] Patient Account Number: [id number] Date of Birth/Sex: Treating RN: 12-08-1955 (66 y.o. [name] Primary Care Inella Kuwahara: [name] Other Clinician: Referring Alizea Pell: Treating Dnaiel Voller/Extender: Lucky Rathke in Treatment: 2 Compression Therapy Performed for Wound Assessment: Wound #7 Right,Medial Malleolus Performed By: Clinician Deon Pilling, RN Compression Type: Three Hydrologist) Signed: 08/23/2021 4:35:20 PM By: Deon Pilling RN, BSN Entered By: Deon Pilling on 08/23/2021 16:29:21 -------------------------------------------------------------------------------- Compression Therapy Details Patient Name: Date of Service: Yaklin, [name] 08/23/2021 3:45 PM Medical Record Number: [id number] Patient Account Number: [id number] Date of Birth/Sex: Treating RN: May 22, 1955 (66 y.o. [name] Primary Care Bradyn Soward: [name] Other Clinician: Referring Alison Breeding: Treating Malyna Budney/Extender: Lucky Rathke in Treatment: 2 Compression Therapy Performed for Wound Assessment: Wound #12 Left,Anterior Lower Leg Performed By: Clinician Deon Pilling, RN Compression Type: Three Hydrologist) Signed: 08/23/2021 4:35:20 PM By: Deon Pilling RN, BSN Entered By: Deon Pilling on 08/23/2021  16:29:21 -------------------------------------------------------------------------------- Compression Therapy Details Patient Name: Date of Service: Misener, [name] 08/23/2021 3:45 PM Medical Record Number: [id number] Patient Account Number: [id number] Date of Birth/Sex: Treating RN: 08-05-1955 (66 y.o. [name] Primary Care Maliik Karner: [name] Other Clinician: Referring Alyanah Elliott: Treating Kelcey Wickstrom/Extender: Lucky Rathke in Treatment: 2 Compression Therapy Performed for Wound Assessment: Wound #8 Right,Lateral Malleolus Performed By: Clinician Deon Pilling, RN Compression Type: Three Hydrologist) Signed: 08/23/2021 4:35:20 PM By: Deon Pilling RN, BSN Entered By: Deon Pilling on 08/23/2021 16:29:21 -------------------------------------------------------------------------------- Encounter Discharge Information Details Patient Name: Date of Service: Ridolfi,  [name] 08/23/2021 3:45 PM Medical Record Number: [id number] Patient Account Number: [id number] Date of Birth/Sex: Treating RN: 1955/05/09 (66 y.o. [name] Primary Care Wylan Gentzler: Other Clinician: [name] Referring Mica Releford: Treating Aliyanah Rozas/Extender: Lucky Rathke in Treatment: 2 Encounter Discharge Information Items Discharge Condition: Stable Ambulatory Status: Wheelchair Discharge Destination: Home Transportation: Private Auto Accompanied By: friend Schedule Follow-up Appointment: Yes Clinical Summary of Care: Electronic Signature(s) Signed: 08/23/2021 4:35:20 PM By: Deon Pilling RN, BSN Entered By: Deon Pilling on 08/23/2021 16:30:26 -------------------------------------------------------------------------------- Patient/Caregiver Education Details Patient Name: Date of Service: [name] 8/14/2023andnbsp3:45 PM Medical Record Number: [id number] Patient Account Number: [id number] Date of Birth/Gender: Treating  RN: 05-Mar-1955 (66 y.o. [name] Primary Care Physician: [name] Other Clinician: Referring Physician: Treating Physician/Extender: Lucky Rathke in Treatment: 2 Education Assessment Education Provided To: Patient Education Topics Provided Wound/Skin Impairment: Handouts: Skin Care Do's and Dont's Methods: Explain/Verbal Responses: Reinforcements needed Electronic Signature(s) Signed: 08/23/2021 4:35:20 PM By: Deon Pilling RN, BSN Entered By: Deon Pilling on 08/23/2021 16:29:48 -------------------------------------------------------------------------------- Wound Assessment Details Patient Name: Date of Service: Bruhn, [name] 08/23/2021 3:45 PM Medical Record Number: [id number] Patient Account Number: [id number] Date of Birth/Sex: Treating RN: [birth year] (66 y.o. [name], [name] Primary Care [name]: [name] Other Clinician: Referring [place]: Treating Dinh Ayotte/Extender: Lucky Rathke in Treatment: 2 Wound Status Wound Number: 10 Primary Etiology: Diabetic Wound/Ulcer of the Lower Extremity Wound Location: Right T Third oe Wound Status: Open Wounding Event: Gradually Appeared Date Acquired: 07/16/2021 Weeks Of Treatment: 2 Clustered Wound: No Wound Measurements Length: (cm) 0.4 Width: (cm) 0.3 Depth: (cm) 0.1 Area: (cm) 0.094 Volume: (cm) 0.009 % Reduction in Area: 65.8% % Reduction in Volume: 66.7% Wound Description Classification: Grade 2 Exudate Amount: Medium Exudate Type: Serosanguineous Exudate Color: red, brown Electronic Signature(s) Signed: 08/23/2021 4:35:20 PM By: Deon Pilling RN, BSN Entered By: Deon Pilling on 08/23/2021 16:29:02 -------------------------------------------------------------------------------- Wound Assessment Details Patient Name: Date of Service: Mogg, [name] 08/23/2021 3:45 PM Medical Record Number: [id number] Patient Account Number: [id number] Date of  Birth/Sex: Treating RN: 1955/03/20 (66 y.o. [name], [name] Primary Care Keonta Monceaux: [name] Other Clinician: Referring Pattricia Weiher: Treating Lamyah Creed/Extender: Lucky Rathke in Treatment: 2 Wound Status Wound Number: 11 Primary Etiology: Venous Leg Ulcer Wound Location: Left, Medial Lower Leg Secondary Etiology: Lymphedema Wounding Event: Gradually Appeared Wound Status: Open Date Acquired: 07/22/2021 Weeks Of Treatment: 2 Clustered Wound: No Wound Measurements Length: (cm) 2.4 Width: (cm) 1.2 Depth: (cm) 0.1 Area: (cm) 2.262 Volume: (cm) 0.226 % Reduction in Area: 58.9% % Reduction in Volume: 58.9% Wound Description Classification: Full Thickness Without Exposed Support Structu Exudate Amount: Large Exudate Type: Serosanguineous Exudate Color: red, brown res Electronic Signature(s) Signed: 08/23/2021 4:35:20 PM By: Deon Pilling RN, BSN Entered By: Deon Pilling on 08/23/2021 16:29:02 -------------------------------------------------------------------------------- Wound Assessment Details Patient Name: Date of Service: Glockner, [name] 08/23/2021 3:45 PM Medical Record Number: [id number] Patient Account Number: [id number] Date of Birth/Sex: Treating RN: 08/05/1955 (66 y.o. [name], [name] Primary Care Austyn Seier: [name] Other Clinician: Referring Erin Uecker: Treating Mcguire Gasparyan/Extender: Lucky Rathke in Treatment: 2 Wound Status Wound Number: 12 Primary Etiology: Venous Leg Ulcer Wound Location: Left, Anterior Lower Leg Secondary Etiology: Lymphedema Wounding Event: Gradually Appeared Wound Status: Open Date Acquired: 07/15/2021 Weeks Of Treatment: 2 Clustered Wound: No Wound Measurements Length: (cm) 4 Width: (cm) 5.7 Depth: (cm) 0.2 Area: (cm) 17.907 Volume: (cm) 3.581 % Reduction in Area: -  31% % Reduction in Volume: -162% Wound Description Classification: Full Thickness Without Exposed Support  Structu Exudate Amount: Large Exudate Type: Serosanguineous Exudate Color: red, brown res Electronic Signature(s) Signed: 08/23/2021 4:35:20 PM By: Deon Pilling RN, BSN Entered By: Deon Pilling on 08/23/2021 16:29:02 -------------------------------------------------------------------------------- Wound Assessment Details Patient Name: Date of Service: Mcclellan, [name] 08/23/2021 3:45 PM Medical Record Number: [id number] Patient Account Number: [id number] Date of Birth/Sex: Treating RN: [birth year] (66 y.o. [name], [name] Primary Care Yeilyn Gent: [name] Other Clinician: Referring Janae Bonser: Treating Amauria Younts/Extender: Lucky Rathke in Treatment: 2 Wound Status Wound Number: 7 Primary Etiology: Venous Leg Ulcer Wound Location: Right, Medial Malleolus Secondary Etiology: Lymphedema Wounding Event: Blister Wound Status: Open Date Acquired: 07/21/2021 Weeks Of Treatment: 2 Clustered Wound: No Wound Measurements Length: (cm) 6.5 Width: (cm) 9.5 Depth: (cm) 0.5 Area: (cm) 48.498 Volume: (cm) 24.249 % Reduction in Area: -26.7% % Reduction in Volume: 9.5% Wound Description Classification: Full Thickness With Exposed Support Structures Exudate Amount: Large Exudate Type: Serosanguineous Exudate Color: red, brown Electronic Signature(s) Signed: 08/23/2021 4:35:20 PM By: Deon Pilling RN, BSN Entered By: Arlana Lindau on 08/23/2021 16:29:02 -------------------------------------------------------------------------------- Wound Assessment Details Patient Name: Date of Service: Mah, [name] 08/23/2021 3:45 PM Medical Record Number: [id number] Patient Account Number: [id number] Date of Birth/Sex: Treating RN: 30-Jun-1955 (66 y.o. [name], [name] Primary Care Emiliya Chretien: [name] Other Clinician: Referring Braeden Kennan: Treating Alyxis Grippi/Extender: Lucky Rathke in Treatment: 2 Wound Status Wound Number: 8 Primary  Etiology: Venous Leg Ulcer Wound Location: Right, Lateral Malleolus Secondary Etiology: Lymphedema Wounding Event: Gradually Appeared Wound Status: Open Date Acquired: 07/22/2021 Weeks Of Treatment: 2 Clustered Wound: Yes Wound Measurements Length: (cm) 3.5 Width: (cm) 9.4 Depth: (cm) 0.1 Area: (cm) 25.84 Volume: (cm) 2.584 % Reduction in Area: -11.9% % Reduction in Volume: 44% Wound Description Classification: Full Thickness Without Exposed Support Structu Exudate Amount: Large Exudate Type: Serosanguineous Exudate Color: red, brown res Electronic Signature(s) Signed: 08/23/2021 4:35:20 PM By: Deon Pilling RN, BSN Entered By: Deon Pilling on 08/23/2021 16:29:02 -------------------------------------------------------------------------------- Wound Assessment Details Patient Name: Date of Service: Dede, [name] 08/23/2021 3:45 PM Medical Record Number: [id number] Patient Account Number: [id number] Date of Birth/Sex: Treating RN: [birth year] (66 y.o. [name], [name] Primary Care Oprah Camarena: [name] Other Clinician: Referring Kalika Smay: Treating Treyten Monestime/Extender: Lucky Rathke in Treatment: 2 Wound Status Wound Number: 9 Primary Etiology: Diabetic Wound/Ulcer of the Lower Extremity Wound Location: Right T Second oe Wound Status: Open Wounding Event: Gradually Appeared Date Acquired: 07/15/2021 Weeks Of Treatment: 2 Clustered Wound: No Wound Measurements Length: (cm) 0.5 Width: (cm) 1 Depth: (cm) 0.1 Area: (cm) 0.393 Volume: (cm) 0.039 % Reduction in Area: 40.5% % Reduction in Volume: 40.9% Wound Description Classification: Grade 2 Exudate Amount: Medium Exudate Type: Serosanguineous Exudate Color: red, brown Electronic Signature(s) Signed: 08/23/2021 4:35:20 PM By: Deon Pilling RN, BSN Entered By: Deon Pilling on 08/23/2021 16:29:02

## 2021-08-27 NOTE — Consult Note (Addendum)
Advanced Heart Failure Team Consult Note   Primary Physician: Ginger Organ., MD PCP-Cardiologist:  Quay Burow, MD  Reason for Consultation: acute on chronic systolic heart failure   HPI:    [name] is seen today for evaluation of acute on chronic systolic heart failure at the request of Dr. Sallyanne Kuster.   66 y/o male w/ h/o chronic systolic heart failure, chronic afib, OSA, HTN, HLD, type 2DM, OSA on CPAP, tobacco use and venous stasis ulcers, followed by wound clinic. Also history of chemotherapy with potential cardiotoxic effects (R-CHOP for lymphoma in 2011).   Admitted in 02/2017 for acute hypoxic respiratory failure requiring intubation and ultimately tracheostomy. This was in the setting of multifocal PNA/ influenza and new onset atrial fibrillation w/ RVR. Echo showed mod LVH and severely reduced LVEF 25-30% w/ diffuse HK. RV normal. At the time, his CM was felt to be viral. Did not get cath. Per d/c summary, DCCV not pursued. Afib treated w/ rate control and Eliquis.   He followed by cardiology post hospital. There was discussion regarding outpatient DCCV, but I don't see that this was every perused.   Repeat echo 07/2017 EF improved to 50-55%, RV mildly dilated w/ mildly reduced systolic function. He was noted to be in afib at the time of study.   08/2017, wore Zio that showed Afib, well rated controlled.   Echo 11/2018 EF 45-50%, RV mildly reduced   Unfortunately, lost to f/u by cardiology for a period of time. Had been seeing [facility] for PAD, s/p left transmetatarsal amputation. Recently saw PCP and complained of increased dyspnea and edema. Referred back to cardiology 7/23.  Torsemide was increased and he was ordered to get repeat echo that was completed on 08/23/2021 and showed EF 30-35%, severe LVH, moderately reduced RV systolic function, moderate TVR. EKG showed atrial fibrillation, right bundle branch block, heart rate 73 bpm.  On 8/17, pt presented to ED given  presyncopal spell while at work. Got lightheaded, dizzy and proudly weak. No LOC. Denies CP.  Found to be hypotensive and bradycardic. Labs in the ED showed Na 137, K 5.0, creatinine 1.51 (b/l < 1.0), mag 1.8, WBC 6.9, hemoglobin 13.0, platelets 129. Initial hsTn 17>>18. BNP 655.    Initial EKG showed atrial fibrillation w/ SVR, HR 28 BPM, RBBB. Follow up EKG showed atrial fibrillation, HR 53 BPM, RBBB. Tele showed several 2 sec pauses  He was admitted. Coreg held given bradycardia. Losartan and torsemide held w/ AKI and hypotension.   He developed worsening periods of high-grade AV block/atrial fibrillation with slow ventricular response and lengthy pauses w/ associated hypotension. Underwent placement of a temporary transvenous pacemaker via right IJ approach last night with improvement in symptoms and blood pressure.  Feels better today. Denies resting dyspnea. TSH elevated 5.8, Free T4 elevated 2.21   Scheduled for [facility] today. [facility] team consulted.   SCr higher, 1.51>>1.68. Denies CP    Echo 08/23/21 AFIB with occasional pauses noted. Left ventricular ejection fraction, by estimation, is 30 to 35%. The left ventricle has moderately decreased function. The left ventricle demonstrates global hypokinesis. There is severe left ventricular hypertrophy. Left ventricular diastolic parameters were normal. 1. Right ventricular systolic function is moderately reduced. The right ventricular size is moderately enlarged. There is mildly elevated pulmonary artery systolic pressure. The estimated right ventricular systolic pressure is 37.1 mmHg. 2. 3. Left atrial size was mildly dilated. 4. Right atrial size was mildly dilated. The mitral valve is normal in  structure. Mild mitral valve regurgitation. No evidence of mitral stenosis. 5. 6. Tricuspid valve regurgitation is moderate. The aortic valve is tricuspid. There is mild calcification of the aortic valve. There is mild thickening of the aortic  valve. Aortic valve regurgitation is not visualized. Aortic valve sclerosis is present, with no evidence of aortic valve stenosis. 7. The inferior vena cava is dilated in size with >50% respiratory variability, suggesting right atrial pressure of 8 mmHg.  Review of Systems: [y] = yes, '[ ]'$  = no   General: Weight gain '[ ]'$ ; Weight loss '[ ]'$ ; Anorexia '[ ]'$ ; Fatigue '[ ]'$ ; Fever '[ ]'$ ; Chills '[ ]'$ ; Weakness [ Y]  Cardiac: Chest pain/pressure '[ ]'$ ; Resting SOB '[ ]'$ ; Exertional SOB [Y ]; Orthopnea '[ ]'$ ; Pedal Edema '[ ]'$ ; Palpitations '[ ]'$ ; Syncope '[ ]'$ ; Presyncope [ Y]; Paroxysmal nocturnal dyspnea'[ ]'$   Pulmonary: Cough '[ ]'$ ; Wheezing'[ ]'$ ; Hemoptysis'[ ]'$ ; Sputum '[ ]'$ ; Snoring '[ ]'$   GI: Vomiting'[ ]'$ ; Dysphagia'[ ]'$ ; Melena'[ ]'$ ; Hematochezia '[ ]'$ ; Heartburn'[ ]'$ ; Abdominal pain '[ ]'$ ; Constipation '[ ]'$ ; Diarrhea '[ ]'$ ; BRBPR '[ ]'$   GU: Hematuria'[ ]'$ ; Dysuria '[ ]'$ ; Nocturia'[ ]'$   Vascular: Pain in legs with walking '[ ]'$ ; Pain in feet with lying flat '[ ]'$ ; Non-healing sores [Y ]; Stroke '[ ]'$ ; TIA '[ ]'$ ; Slurred speech '[ ]'$ ;  Neuro: Headaches'[ ]'$ ; Vertigo'[ ]'$ ; Seizures'[ ]'$ ; Paresthesias'[ ]'$ ;Blurred vision '[ ]'$ ; Diplopia '[ ]'$ ; Vision changes '[ ]'$   Ortho/Skin: Arthritis '[ ]'$ ; Joint pain '[ ]'$ ; Muscle pain '[ ]'$ ; Joint swelling '[ ]'$ ; Back Pain '[ ]'$ ; Rash '[ ]'$   Psych: Depression'[ ]'$ ; Anxiety'[ ]'$   Heme: Bleeding problems '[ ]'$ ; Clotting disorders '[ ]'$ ; Anemia '[ ]'$   Endocrine: Diabetes [Y ]; Thyroid dysfunction[Y ]  Home Medications Prior to Admission medications   Medication Sig Start Date End Date Taking? Authorizing Provider  acetaminophen (TYLENOL) 325 MG tablet Take 650 mg by mouth every 6 (six) hours as needed for moderate pain.    Yes [provider]  ascorbic acid (VITAMIN C) 500 MG tablet Take 1 tablet (500 mg total) by mouth 3 (three) times daily. Patient taking differently: Take 500 mg by mouth 2 (two) times daily. 08/26/20  Yes Angiulli, Lavon Paganini, PA-C  atorvastatin (LIPITOR) 20 MG tablet TAKE 1 TABLET BY MOUTH EVERY DAY *PT OVERDUE FOR OFFICE VISIT*  08/26/20  Yes Angiulli, Lavon Paganini, PA-C  cholecalciferol (VITAMIN D) 25 MCG (1000 UNIT) tablet Take 1 tablet (1,000 Units total) by mouth daily. 08/26/20  Yes Angiulli, Lavon Paganini, PA-C  colchicine 0.6 MG tablet Take 0.6 mg by mouth 2 (two) times daily as needed.   Yes [provider]  ELIQUIS 5 MG TABS tablet TAKE 1 TABLET BY MOUTH TWICE A DAY 05/04/21  Yes Hochrein, Jeneen Rinks, MD  KLOR-CON M20 20 MEQ tablet Take 1 tablet (20 mEq total) by mouth daily. 08/04/21  Yes Loel Dubonnet, NP  levofloxacin (LEVAQUIN) 750 MG tablet Take 750 mg by mouth daily. 08/12/21  Yes [provider]  losartan (COZAAR) 50 MG tablet Take 1 tablet (50 mg total) by mouth daily. 08/24/21  Yes Lorretta Harp, MD  Omega 3 340 MG CPDR Take 1 capsule (340 mg total) by mouth 2 (two) times daily. Patient taking differently: Take 340 mg by mouth daily. 08/26/20  Yes Angiulli, Lavon Paganini, PA-C  omeprazole (PRILOSEC) 40 MG capsule Take 1 capsule (40 mg total) by mouth daily. 08/26/20  Yes Angiulli, Lavon Paganini, PA-C  SANTYL 250 UNIT/GM ointment  APPLY 1 APPLICATION. TOPICALLY DAILY. 07/05/21  Yes Trula Slade, DPM  sertraline (ZOLOFT) 50 MG tablet Take 1 tablet (50 mg total) by mouth daily. 08/26/20  Yes Angiulli, Lavon Paganini, PA-C  Tirzepatide [facility]) Inject 1 Dose into the skin once a week.   Yes [provider]  torsemide (DEMADEX) 20 MG tablet Take 2 tablets (40 mg total) by mouth 2 (two) times daily. Patient taking differently: Take 40 mg by mouth daily. 08/02/21  Yes Loel Dubonnet, NP  vitamin B-12 (CYANOCOBALAMIN) 1000 MCG tablet Take 1 tablet (1,000 mcg total) by mouth daily. 08/26/20  Yes Angiulli, Lavon Paganini, PA-C  albuterol (VENTOLIN HFA) 108 (90 Base) MCG/ACT inhaler Inhale 2 puffs into the lungs every 6 (six) hours as needed for wheezing or shortness of breath. Patient not taking: Reported on 08/26/2021 08/26/20   Angiulli, Lavon Paganini, PA-C  Continuous Blood Gluc Receiver (FREESTYLE LIBRE 2 READER) DEVI  1 each by Does not apply route daily. 10/05/20   Raulkar, Clide Deutscher, MD  Continuous Blood Gluc Sensor (FREESTYLE LIBRE 2 SENSOR) MISC 1 each by Does not apply route daily. 10/05/20   Raulkar, Clide Deutscher, MD  polycarbophil (FIBERCON) 625 MG tablet Take 1 tablet (625 mg total) by mouth daily. Patient not taking: Reported on 08/26/2021 08/26/20   Angiulli, Lavon Paganini, PA-C  sacubitril-valsartan (ENTRESTO) 24-26 MG Take 1 tablet by mouth 2 (two) times daily. 08/04/21   Loel Dubonnet, NP  carvedilol (COREG) 6.25 MG tablet Take 6.25 mg by mouth 2 (two) times daily. 09/13/20 10/05/20  [provider]  sodium chloride (OCEAN) 0.65 % SOLN nasal spray Place 2 sprays into both nostrils as needed for congestion. 04/05/17 10/05/20  Bary Leriche, PA-C    Past Medical History: Past Medical History:  Diagnosis Date   Acute systolic HF (heart failure) (HCC)    Arthritis    lt foot, hips knees    Atrial fibrillation (HCC)    Diabetes mellitus, new onset ([place])    Diverticulosis    Dyslipidemia    Gout attack 01/16/2012   Hypertension    Internal hemorrhoids    Lymphoma ([place])    remission for about 2 years, chemo 3 years prior   Pneumonia due to COVID-19 virus 07/2018   Pulmonary hypertension ([place])    Sepsis ([place]) 02/2017   secondary to influenza; requiring trach   Tubular adenoma of colon    Venous stasis ulcers ([place])     Past Surgical History: Past Surgical History:  Procedure Laterality Date   ABDOMINAL AORTOGRAM W/LOWER EXTREMITY N/A 08/14/2020   Procedure: ABDOMINAL AORTOGRAM W/LOWER EXTREMITY;  Surgeon: Elam Dutch, MD;  Location: [place] [facility];  Service: Cardiovascular;  Laterality: N/A;   AMPUTATION TOE Left 05/08/2019   Procedure: AMPUTATION LEFT GREAT  TOE;  Surgeon: Trula Slade, DPM;  Location: [facility];  Service: Podiatry;  Laterality: Left;   ARTERIAL LINE INSERTION Right 08/26/2021   Procedure: ARTERIAL LINE INSERTION;  Surgeon: Early Osmond, MD;  Location: [place] [facility];  Service: Cardiovascular;  Laterality: Right;  rt radial   BRONCHOSCOPY     ESOPHAGOGASTRODUODENOSCOPY ENDOSCOPY     Dr Royden Purl HERNIA REPAIR Left    2001   IR REMOVAL TUN ACCESS W/ PORT W/O FL MOD SED  02/03/2020   PORTA CATH INSERTION  2011   TEMPORARY PACEMAKER N/A 08/26/2021   Procedure: TEMPORARY PACEMAKER;  Surgeon: Early Osmond, MD;  Location: [place] [facility];  Service: Cardiovascular;  Laterality: N/A;   TRACHEOSTOMY  03/2017   with decannulation   TRANSMETATARSAL AMPUTATION Left 08/05/2020   Procedure: TRANSMETATARSAL AMPUTATION;  Surgeon: Trula Slade, DPM;  Location: [place];  Service: Podiatry;  Laterality: Left;    Family History: Family History  Problem Relation Age of Onset   Dementia Brother 33       frontal lobe    Dementia Mother    Arthritis Mother    Hypertension Mother    CVA Sister 31   Colon cancer Neg Hx    Pancreatic cancer Neg Hx    Stomach cancer Neg Hx    Rectal cancer Neg Hx    Liver cancer Neg Hx     Social History: Social History   Socioeconomic History   Marital status: Divorced    Spouse name: Not on file   Number of children: Not on file   Years of education: Not on file   Highest education level: Not on file  Occupational History   Occupation: cosmetologist  Tobacco Use   Smoking status: Former    Packs/day: 0.50    Years: 20.00    Total pack years: 10.00    Types: Cigarettes    Quit date: 01/10/2009    Years since quitting: 12.6   Smokeless tobacco: Never  Vaping Use   Vaping Use: Never used  Substance and Sexual Activity   Alcohol use: Not Currently    Comment: 2-3 times per week   Drug use: No   Sexual activity: Not on file  Other Topics Concern   Not on file  Social History Narrative   Not on file   Social Determinants of Health   Financial Resource Strain: Not on file  Food Insecurity: Not on file  Transportation Needs: Not on file  Physical Activity: Not on file  Stress: Not  on file  Social Connections: Not on file    Allergies:  Allergies  Allergen Reactions   Latex Hives and Swelling   Ace Inhibitors     Other reaction(s): cough   Penicillin G     Other reaction(s): rash    Objective:    Vital Signs:   Temp:  [97.9 F (36.6 C)-98.4 F (36.9 C)] 97.9 F (36.6 C) (08/18 0800) Pulse Rate:  [25-144] 56 (08/18 0600) Resp:  [9-21] 13 (08/18 0600) BP: (40-146)/(25-124) 130/76 (08/18 0600) SpO2:  [77 %-100 %] 96 % (08/18 0600) Arterial Line BP: (85-134)/(43-88) 92/78 (08/18 0400) Weight:  [161.9 kg] 161.9 kg (08/18 0600) Last BM Date : 08/26/21  Weight change: Filed Weights   08/27/21 0600  Weight: (!) 161.9 kg    Intake/Output:   Intake/Output Summary (Last 24 hours) at 08/27/2021 0912 Last data filed at 08/27/2021 0600 Gross per 24 hour  Intake --  Output 500 ml  Net -500 ml      Physical Exam    General:  morbidly obese. No resp difficulty HEENT: normal Neck: supple. Thick neck, JVP not visualized . Carotids 2+ bilat; no bruits. No lymphadenopathy or thyromegaly appreciated. + rt IJ TVP  Cor: PMI nondisplaced. Regular rate & rhythm. No rubs, gallops or murmurs. Lungs: clear Abdomen: obese, soft, nontender, nondistended. No hepatosplenomegaly. No bruits or masses. Good bowel sounds. Extremities: no cyanosis, clubbing, rash, trace b/l LE edema + unna boots  Neuro: alert & orientedx3, cranial nerves grossly intact. moves all 4 extremities w/o difficulty. Affect pleasant   Telemetry   Afib 50s, paced   EKG  Afib w/ SVR 28 bpm w/ RBBB   Labs   Basic Metabolic Panel: Recent Labs  Lab 08/24/21 1546 08/26/21 1232 08/26/21 1253 08/27/21 0633  NA 141 136 137 135  K 4.6 4.8 5.0 4.8  CL 102 104 105 102  CO2 24  --  22 21*  GLUCOSE 82 104* 109* 90  BUN '19 21 20 '$ 25*  CREATININE 1.22 1.50* 1.51* 1.68*  CALCIUM 9.2  --  8.9 9.0  MG  --   --  1.8  --   PHOS  --   --  4.1  --     Liver Function Tests: Recent Labs  Lab  08/26/21 1253  AST 21  ALT 10  ALKPHOS 120  BILITOT 1.8*  PROT 6.8  ALBUMIN 3.3*   No results for input(s): "LIPASE", "AMYLASE" in the last 168 hours. No results for input(s): "AMMONIA" in the last 168 hours.  CBC: Recent Labs  Lab 08/26/21 1232 08/26/21 1253  WBC  --  6.9  NEUTROABS  --  5.1  HGB 13.9 13.0  HCT 41.0 41.3  MCV  --  95.4  PLT  --  129*    Cardiac Enzymes: No results for input(s): "CKTOTAL", "CKMB", "CKMBINDEX", "TROPONINI" in the last 168 hours.  BNP: BNP (last 3 results) Recent Labs    08/02/21 1150 08/26/21 1259  BNP 475.7* 655.7*    ProBNP (last 3 results) No results for input(s): "PROBNP" in the last 8760 hours.   CBG: Recent Labs  Lab 08/26/21 1819 08/26/21 2136 08/27/21 0051  GLUCAP 97 95 87    Coagulation Studies: No results for input(s): "LABPROT", "INR" in the last 72 hours.   Imaging   DG CHEST PORT 1 VIEW  Result Date: 08/27/2021 CLINICAL DATA:  Heart block, temporary pacemaker EXAM: PORTABLE CHEST 1 VIEW COMPARISON:  08/26/2021 FINDINGS: Single frontal view of the chest demonstrates right internal jugular transvenous pacer, tip overlying region of the right ventricle. Cardiac silhouette is enlarged but stable. Continued central vascular congestion, without focal consolidation, effusion, or pneumothorax. External defibrillator pads overlie the left chest. IMPRESSION: 1. Transvenous pacer via right internal jugular approach tip overlying the right ventricle. 2. Continued central vascular congestion without overt edema. Electronically Signed   By: Randa Ngo M.D.   On: 08/27/2021 08:20   CARDIAC CATHETERIZATION  Result Date: 08/27/2021 1.  Successful temporary pacemaker placement with a backup rate of 50 bpm, an output of 10 mA, and a threshold of 0.6 mA. 2.  Successful right radial arterial line. Recommendation: Continued observation in the 2 Heart unit and EP consultation in the morning.   DG CHEST PORT 1 VIEW  Result  Date: 08/26/2021 CLINICAL DATA:  Near syncopal episode.  CHF. EXAM: PORTABLE CHEST 1 VIEW COMPARISON:  07/22/2020 FINDINGS: Cardiac pads overlying the left side of the chest. Low lung volumes. Heart is enlarged for size. Again noted are prominent central vascular markings. Slightly prominent interstitial markings in right suprahilar region appear chronic. Negative for a pneumothorax. IMPRESSION: 1. Cardiomegaly with low lung volumes. 2. Prominent central vascular markings without overt pulmonary edema. Electronically Signed   By: Markus Daft M.D.   On: 08/26/2021 14:49     Medications:     Current Medications:  aspirin  81 mg Oral Daily   atorvastatin  20 mg Oral Daily   Chlorhexidine Gluconate Cloth  6 each Topical Daily   heparin  5,000 Units Subcutaneous Q8H   insulin aspart  0-15 Units Subcutaneous TID WC  levofloxacin  750 mg Oral Daily   sertraline  50 mg Oral Daily   sodium chloride flush  3 mL Intravenous Q12H   sodium chloride flush  3 mL Intravenous Q12H    Infusions:  sodium chloride        Patient Profile   66 y/o male w/ h/o HFrEF>>HFimEF, chronic afib, OSA on CPAP, HTN, HLD, type 2DM, tobacco use, PAD and chronic venous stasis ulcers, followed by wound clinic. Also history of chemotherapy with potential cardiotoxic effects (R-CHOP for lymphoma in 2011). Admitted for near syncope, hypotension and profound bradycardia, requiring TVP. Recent outpatient echo showed drop in EF, 30-35%, severe LVH, moderately reduced RV systolic function, moderate TVR.  Assessment/Plan   Acute on Chronic Systolic Heart Failure - Echo 02/2017 EF 25-30% w/ diffuse HK. RV normal. Felt to be viral CM +/- component of tachymediated in setting of influenza PNA and rapid Afib  - Echo 07/2017 EF recovered, 50-55%, RV mildly reduced - Echo 11/2018 EF 45-50%, RV mildly reduced (in rate controlled afib)  - Echo 08/23/21 EF 30-35%, severe LVH, moderately reduced RV systolic function, moderate TVR - NYHA  Class II-III, confounded by obesity  - volume assessment difficult on exam - Plan R/LHC today to exclude underlying CAD, assess filling pressures and CO  - given concomitant conduction disease, profound bradycardia and LVH, consider Amyloid w/u  - GDMT currently limited by AKI and recent hypotension   2. Profound Bradycardia w/ High-grade AV block - s/p placement of TVP  - off coreg - has known RBBB  - Hs trop not c/w ACS but multiple risk factors for ischemic HD + evidence of coronary calcifications. Plan LHC to exclude CAD/ high grade RCA disease   - if PPM needed, may need to consider leadless pacer given chronic LEE wounds.   3. Chronic Afib - dates back to at least 2019, has been historically well rate controlled - eliquis PTA, holding for planned cath  - has never had attempt at [place]. Given chronicity , tx w/ rhythm control at this point may be difficult  - size may prohibit ablation  - continue tx of OSA w/ CPAP   4. AKI  - b/w Scr 0.9 - 1.51 on admit, >>1.68 today  - suspect 2/2 hypotension/ bradycardia - BP/HR now improved w/ TVP - plan RHC to assess CO  - follow BMP   4. PAD/ Chronic Venous Stasis Wounds  - s/p left transmetatarsal amputation - followed by [facility] and Wound clinic  - on levofloxacin 750 mg qd   5. H/o Lymphoma - s/p R-CHOP in 2011 (potential cardiotoxic effects)   6. Type 2DM - Hgb A1c 6.0    Length of Stay: 1  Brittainy Simmons, PA-C  08/27/2021, 9:12 AM  Advanced Heart Failure Team Pager [phone] (M-F; 7a - 5p)  Please contact [facility] for night-coverage after hours (4p -7a ) and weekends on [email or web address]  Agree with above.  66 yo male with obesity, OSA, HTN, DM2, chronic AF, chronic LE edema and previous lymphoma s/p R-CHOP 2011  Now admitted with recurrent HF c/b CHB requiring TVP. Hstrop negative.   SOB improved. No CP.   General:  Obese male lying in bed  No resp difficulty HEENT: normal Neck: supple. JVP to jaw Carotids 2+  bilat; no bruits. No lymphadenopathy or thryomegaly appreciated. Cor: PMI nondisplaced. Regular rate & rhythm. No rubs, gallops or murmurs. Lungs: clear Abdomen: obese soft, nontender, nondistended. No hepatosplenomegaly. No bruits or masses. Good bowel sounds.  Extremities: no cyanosis, clubbing, rash, 2+ edema with UNNA Neuro: alert & orientedx3, cranial nerves grossly intact. moves all 4 extremities w/o difficulty. Affect pleasant  He is at high risk for severe underlying CAD yet I am not sure that is driving his current presentation. Concern also for possible infiltrative CM.   Agree with plan for R/L cath today followed by cMRI if able. Will likely need biv pacing.   D/w Dr. Sallyanne Kuster  CRITICAL CARE Performed by: Glori Bickers  Total critical care time: 35 minutes  Critical care time was exclusive of separately billable procedures and treating other patients.  Critical care was necessary to treat or prevent imminent or life-threatening deterioration.  Critical care was time spent personally by me (independent of midlevel providers or residents) on the following activities: development of treatment plan with patient and/or surrogate as well as nursing, discussions with consultants, evaluation of patient's response to treatment, examination of patient, obtaining history from patient or surrogate, ordering and performing treatments and interventions, ordering and review of laboratory studies, ordering and review of radiographic studies, pulse oximetry and re-evaluation of patient's condition.  Glori Bickers, MD  11:39 AM

## 2021-08-27 NOTE — TOC Benefit Eligibility Note (Signed)
Patient Teacher, English as a foreign language completed.    The patient is currently admitted and upon discharge could be taking Entresto 24-26 mg.  The current 30 day co-pay is $37.00.   The patient is currently admitted and upon discharge could be taking Farxiga 10 mg.  The current 30 day co-pay is $37.00.   The patient is currently admitted and upon discharge could be taking Jardiance 10 mg.  The current 30 day co-pay is $37.00.   The patient is insured through [place], [place] Patient Advocate Specialist [place] Patient Advocate Team Direct Number: [phone]  Fax: [phone]

## 2021-08-27 NOTE — Progress Notes (Addendum)
Progress Note  Patient Name: [name] Date of Encounter: 08/27/2021  [facility] Cardiologist: Quay Burow, MD   Subjective   Required insertion of a temporary transvenous pacemaker via right IJ yesterday due to worsening periods of high-grade AV block/atrial fibrillation with slow ventricular sponsor and lengthy pauses. Once pacemaker was placed, has not had problems with hypotension.  He also reports that his breathing is better. Has not had any complaints of angina throughout. Slight worsening of renal function likely reflects period of reduced cardiac output yesterday, but has good urine output.  Inpatient Medications    Scheduled Meds:  aspirin  81 mg Oral Daily   atorvastatin  20 mg Oral Daily   Chlorhexidine Gluconate Cloth  6 each Topical Daily   heparin  5,000 Units Subcutaneous Q8H   insulin aspart  0-15 Units Subcutaneous TID WC   levofloxacin  750 mg Oral Daily   sertraline  50 mg Oral Daily   sodium chloride flush  3 mL Intravenous Q12H   sodium chloride flush  3 mL Intravenous Q12H   Continuous Infusions:  sodium chloride     PRN Meds: sodium chloride, acetaminophen, atropine, ondansetron (ZOFRAN) IV, sodium chloride flush   Vital Signs    Vitals:   08/27/21 0530 08/27/21 0545 08/27/21 0600 08/27/21 0800  BP:   130/76   Pulse: 71 72 (!) 56   Resp: '19 18 13   '$ Temp:    97.9 F (36.6 C)  TempSrc:    Oral  SpO2: 90% (!) 89% 96%   Weight:   (!) 161.9 kg     Intake/Output Summary (Last 24 hours) at 08/27/2021 0845 Last data filed at 08/27/2021 0600 Gross per 24 hour  Intake --  Output 500 ml  Net -500 ml      08/27/2021    6:00 AM 08/24/2021    2:38 PM 08/02/2021   11:02 AM  Last 3 Weights  Weight (lbs) 356 lb 14.8 oz 336 lb 353 lb  Weight (kg) 161.9 kg 152.409 kg 160.12 kg      Telemetry    Atrial fibrillation with slow ventricular response.  Frequent intervention of pacemaker set at backup pacing rate of 50 bpm.- Personally  Reviewed  ECG    Fibrillation with slow ventricular response and a very broad intraventricular conduction delay- Personally Reviewed  Physical Exam  Obese.  Appears quite comfortable lying in bed with head of bed at 20 degrees elevation GEN: No acute distress.   Neck: Right IJ temporary transvenous pacemaker site looks healthy.  Unable to evaluate JVD due to body habitus Cardiac: Irregular, no murmurs, rubs, or gallops.  Respiratory: Clear to auscultation bilaterally. GI: Soft, nontender, non-distended  MS: 3+ bilateral lower extremity edema; No deformity. Neuro:  Nonfocal  Psych: Normal affect   Labs    High Sensitivity Troponin:   Recent Labs  Lab 08/26/21 1253 08/26/21 1546  TROPONINIHS 17 18*     Chemistry Recent Labs  Lab 08/24/21 1546 08/26/21 1232 08/26/21 1253 08/27/21 0633  NA 141 136 137 135  K 4.6 4.8 5.0 4.8  CL 102 104 105 102  CO2 24  --  22 21*  GLUCOSE 82 104* 109* 90  BUN '19 21 20 '$ 25*  CREATININE 1.22 1.50* 1.51* 1.68*  CALCIUM 9.2  --  8.9 9.0  MG  --   --  1.8  --   PROT  --   --  6.8  --   ALBUMIN  --   --  3.3*  --   AST  --   --  21  --   ALT  --   --  10  --   ALKPHOS  --   --  120  --   BILITOT  --   --  1.8*  --   GFRNONAA  --   --  51* 45*  ANIONGAP  --   --  10 12    Lipids No results for input(s): "CHOL", "TRIG", "HDL", "LABVLDL", "LDLCALC", "CHOLHDL" in the last 168 hours.  Hematology Recent Labs  Lab 08/26/21 1232 08/26/21 1253  WBC  --  6.9  RBC  --  4.33  HGB 13.9 13.0  HCT 41.0 41.3  MCV  --  95.4  MCH  --  30.0  MCHC  --  31.5  RDW  --  17.2*  PLT  --  129*   Thyroid  Recent Labs  Lab 08/26/21 1300 08/26/21 1546  TSH 5.805*  --   FREET4  --  2.21*    BNP Recent Labs  Lab 08/26/21 1259  BNP 655.7*    DDimer No results for input(s): "DDIMER" in the last 168 hours.   Radiology    DG CHEST PORT 1 VIEW  Result Date: 08/27/2021 CLINICAL DATA:  Heart block, temporary pacemaker EXAM: PORTABLE CHEST 1 VIEW  COMPARISON:  08/26/2021 FINDINGS: Single frontal view of the chest demonstrates right internal jugular transvenous pacer, tip overlying region of the right ventricle. Cardiac silhouette is enlarged but stable. Continued central vascular congestion, without focal consolidation, effusion, or pneumothorax. External defibrillator pads overlie the left chest. IMPRESSION: 1. Transvenous pacer via right internal jugular approach tip overlying the right ventricle. 2. Continued central vascular congestion without overt edema. Electronically Signed   By: Randa Ngo M.D.   On: 08/27/2021 08:20   CARDIAC CATHETERIZATION  Result Date: 08/27/2021 1.  Successful temporary pacemaker placement with a backup rate of 50 bpm, an output of 10 mA, and a threshold of 0.6 mA. 2.  Successful right radial arterial line. Recommendation: Continued observation in the 2 Heart unit and EP consultation in the morning.   DG CHEST PORT 1 VIEW  Result Date: 08/26/2021 CLINICAL DATA:  Near syncopal episode.  CHF. EXAM: PORTABLE CHEST 1 VIEW COMPARISON:  07/22/2020 FINDINGS: Cardiac pads overlying the left side of the chest. Low lung volumes. Heart is enlarged for size. Again noted are prominent central vascular markings. Slightly prominent interstitial markings in right suprahilar region appear chronic. Negative for a pneumothorax. IMPRESSION: 1. Cardiomegaly with low lung volumes. 2. Prominent central vascular markings without overt pulmonary edema. Electronically Signed   By: Markus Daft M.D.   On: 08/26/2021 14:49    Cardiac Studies   Echocardiogram 08/23/2021  1. AFIB with occasional pauses noted. Left ventricular ejection fraction,  by estimation, is 30 to 35%. The left ventricle has moderately decreased  function. The left ventricle demonstrates global hypokinesis. There is  severe left ventricular  hypertrophy. Left ventricular diastolic parameters were normal.   2. Right ventricular systolic function is moderately reduced.  The right  ventricular size is moderately enlarged. There is mildly elevated  pulmonary artery systolic pressure. The estimated right ventricular  systolic pressure is 90.2 mmHg.   3. Left atrial size was mildly dilated.   4. Right atrial size was mildly dilated.   5. The mitral valve is normal in structure. Mild mitral valve  regurgitation. No evidence of mitral stenosis.   6. Tricuspid valve regurgitation is moderate.  7. The aortic valve is tricuspid. There is mild calcification of the  aortic valve. There is mild thickening of the aortic valve. Aortic valve  regurgitation is not visualized. Aortic valve sclerosis is present, with  no evidence of aortic valve stenosis.   8. The inferior vena cava is dilated in size with >50% respiratory  variability, suggesting right atrial pressure of 8 mmHg.   Comparison(s): Prior images reviewed side by side.   Patient Profile     66 y.o. male with lengthy history of cardiomyopathy and persistent atrial fibrillation, major intraventricular conduction delay since 2019, on background of morbid obesity, OSA on CPAP, HTN, DM type II, hypercholesterolemia, previous history of tobacco use, chronic lower extremity edema with venous stasis ulcers, presenting with atrial fibrillation with slow ventricular response and lengthy pauses with near syncope  Assessment & Plan    1.  Bradycardia: Has longstanding persistent atrial fibrillation.  At one point was on carvedilol 25 mg twice daily, currently on carvedilol 6.25 mg twice daily.  Now presents with lengthy pauses and severe bradycardia (28 bpm) associated with hypotension and near syncope.  Underwent placement of a temporary transvenous pacemaker via right IJ approach last night with improvement in symptoms and blood pressure.  Last dose of carvedilol was running of 08/26/2021.  We will wait for at least another 24 hours before deciding whether he needs a permanent device implanted.  Since he has a very broad  QRS complex and severely depressed left ventricular systolic function he would benefit from implantation of a biventricular pacemaker defibrillator (CRT-D).  If atrioventricular block improves then he does not require immediate device implantation, would delay a decision regarding type of device that he will need until he has undergone full evaluation and treatment for his cardiomyopathy for about 3 months.  Entresto currently on hold due to hypotension and acute kidney injury.  Beta-blockers should not be restarted until he has a backup cardiac rhythm device.  Consider SGLT2 inhibitors once renal function stabilizes. 2.  Atrial fibrillation: Present at least since 2019.  Likely a permanent arrhythmia.  He was receiving Eliquis 5 mg twice daily, last dose was administered morning of 08/26/2021, currently on hold for cardiac catheterization.  Depending on results of heart catheterization we will resume anticoagulation with Eliquis or intravenous heparin after the procedure. 3.  Acute on chronic systolic heart failure: He is clearly hypervolemic but has predominant right heart failure findings with severe lower extremity edema, dyspnea has improved after pacemaker implantation.  Etiology of cardiomyopathy has never been firmly established.  It was first noted after critical respiratory illness in 2019, with some recovery of EF to mildly depressed range in 2020, but now with severe reduction in left ventricular systolic function.  He does not have angina pectoris or history of myocardial infarction but has numerous risk factors for coronary artery disease including age/gender, diabetes mellitus, previous history of smoking, dyslipidemia, hypertension.  Also has evidence of calcific aortic and coronary atherosclerosis on noncardiac imaging studies.  Plan coronary angiography today.  He also has a history of chemotherapy with potential cardiotoxic effects (R-CHOP for lymphoma in 2011). 4. AKI: Relatively mild and seems  to be reaching a plateau.  Baseline creatinine seems to be around 1.2, yesterday 1.5, today up to 1.68.  Likely related to reduced cardiac output/hypotension yesterday.  Was on Entresto (just started 2 days earlier) and torsemide prior to admission, both on hold.  Avoid all nephrotoxic drugs.  Use minimum contrast for angiography.  It is  unlikely that he will have coronary anatomy that would require percutaneous revascularization, but if that is the case he would probably benefit from a staged procedure to reduce overall contrast load. 5. IVCD: He has a very broad QRS complex at 170-195 ms.  Despite the fact that this looks more like an atypical right bundle branch block, I suspect that he will derive significant benefit from CRT nonetheless.   CRITICAL CARE Performed by: Dani Gobble Jaelah Hauth   Total critical care time: 55 minutes  Critical care time was exclusive of separately billable procedures and treating other patients.  Critical care was necessary to treat or prevent imminent or life-threatening deterioration.  Critical care was time spent personally by me on the following activities: development of treatment plan with patient and/or surrogate as well as nursing, discussions with consultants, evaluation of patient's response to treatment, examination of patient, obtaining history from patient or surrogate, ordering and performing treatments and interventions, ordering and review of laboratory studies, ordering and review of radiographic studies, pulse oximetry and re-evaluation of patient's condition.   For questions or updates, please contact [place] Please consult [email or web address] for contact info under        Signed, Sanda Klein, MD  08/27/2021, 8:45 AM

## 2021-08-27 NOTE — Progress Notes (Signed)
[name] ([id number]) , Visit Report for 08/23/2021 SuperBill Details Patient Name: Date of Service: AMIRR, ACHORD 08/23/2021 Medical Record Number: [id number] Patient Account Number: [id number] Date of Birth/Sex: Treating RN: 05-Oct-1955 (66 y.o. [name], [name] Primary Care Provider: [name] Other Clinician: Referring Provider: Treating Provider/Extender: Lucky Rathke in Treatment: 2 Diagnosis Coding ICD-10 Codes Code Description I89.0 Lymphedema, not elsewhere classified I87.333 Chronic venous hypertension (idiopathic) with ulcer and inflammation of bilateral lower extremity L97.828 Non-pressure chronic ulcer of other part of left lower leg with other specified severity L97.818 Non-pressure chronic ulcer of other part of right lower leg with other specified severity E11.621 Type 2 diabetes mellitus with foot ulcer L97.518 Non-pressure chronic ulcer of other part of right foot with other specified severity Facility Procedures CPT4 Description Modifier Quantity Code [id number] BILATERAL: Application of multi-layer venous compression system; leg (below knee), including ankle and 1 foot. Electronic Signature(s) Signed: 08/23/2021 4:35:20 PM By: Deon Pilling RN, BSN Signed: 08/24/2021 9:21:42 AM By: Kalman Shan DO Entered By: Deon Pilling on 08/23/2021 16:30:32

## 2021-08-28 DIAGNOSIS — I2510 Atherosclerotic heart disease of native coronary artery without angina pectoris: Secondary | ICD-10-CM

## 2021-08-28 DIAGNOSIS — I5042 Chronic combined systolic (congestive) and diastolic (congestive) heart failure: Secondary | ICD-10-CM | POA: Diagnosis not present

## 2021-08-28 DIAGNOSIS — I4439 Other atrioventricular block: Secondary | ICD-10-CM | POA: Diagnosis not present

## 2021-08-28 DIAGNOSIS — N179 Acute kidney failure, unspecified: Secondary | ICD-10-CM

## 2021-08-28 DIAGNOSIS — E1169 Type 2 diabetes mellitus with other specified complication: Secondary | ICD-10-CM

## 2021-08-28 DIAGNOSIS — E669 Obesity, unspecified: Secondary | ICD-10-CM

## 2021-08-28 LAB — BASIC METABOLIC PANEL
Anion gap: 7 (ref 5–15)
BUN: 25 mg/dL — ABNORMAL HIGH (ref 8–23)
CO2: 25 mmol/L (ref 22–32)
Calcium: 9 mg/dL (ref 8.9–10.3)
Chloride: 105 mmol/L (ref 98–111)
Creatinine, Ser: 1.48 mg/dL — ABNORMAL HIGH (ref 0.61–1.24)
GFR, Estimated: 52 mL/min — ABNORMAL LOW (ref >60)
Glucose, Bld: 104 mg/dL — ABNORMAL HIGH (ref 70–99)
Potassium: 5.6 mmol/L — ABNORMAL HIGH (ref 3.5–5.1)
Sodium: 137 mmol/L (ref 135–145)

## 2021-08-28 LAB — LIPID PANEL
Cholesterol: 109 mg/dL (ref 0–200)
HDL: 21 mg/dL — ABNORMAL LOW (ref >40)
LDL Cholesterol: 77 mg/dL (ref 0–99)
Total CHOL/HDL Ratio: 5.2 RATIO
Triglycerides: 56 mg/dL (ref <150)
VLDL: 11 mg/dL (ref 0–40)

## 2021-08-28 LAB — GLUCOSE, CAPILLARY
Glucose-Capillary: 101 mg/dL — ABNORMAL HIGH (ref 70–99)
Glucose-Capillary: 108 mg/dL — ABNORMAL HIGH (ref 70–99)
Glucose-Capillary: 111 mg/dL — ABNORMAL HIGH (ref 70–99)
Glucose-Capillary: 124 mg/dL — ABNORMAL HIGH (ref 70–99)

## 2021-08-28 LAB — APTT
aPTT: 136 seconds — ABNORMAL HIGH (ref 24–36)
aPTT: 51 seconds — ABNORMAL HIGH (ref 24–36)

## 2021-08-28 MED ORDER — ORAL CARE MOUTH RINSE: 15.0000 mL | OROMUCOSAL | Status: DC | PRN

## 2021-08-28 NOTE — Progress Notes (Signed)
Progress Note  Patient Name: [name] Date of Encounter: 08/28/2021  [facility] Cardiologist: Quay Burow, MD   Subjective   Denies angina or dyspnea.  He inquires whether he needs to stay on oxygen all the time (he is currently on 4 L/minute by nasal cannula with O2 sat 96%).  He does not appear to notice when he becomes hypoxic. Denies any discomfort at cath access site or right IJ temporary transvenous pacemaker site. He is pacing 100% of the time at a lower rate limit of 50 bpm (background atrial fibrillation).  Underlying rate is 20-40 bpm when the pacemaker is turned down. Catheterization showed very widespread coronary atherosclerosis but no meaningful stenoses other than a 90% lesion in the PDA. Hemodynamics show biventricular failure, right worse than left.  Inpatient Medications    Scheduled Meds:  aspirin  81 mg Oral Daily   atorvastatin  20 mg Oral QHS   Chlorhexidine Gluconate Cloth  6 each Topical Daily   insulin aspart  0-15 Units Subcutaneous TID WC   levofloxacin  750 mg Oral QHS   mupirocin ointment   Nasal BID   sertraline  50 mg Oral Daily   sodium chloride flush  3 mL Intravenous Q12H   sodium chloride flush  3 mL Intravenous Q12H   sodium chloride flush  3 mL Intravenous Q12H   Continuous Infusions:  sodium chloride     sodium chloride 100 mL/hr at 08/28/21 0649   sodium chloride 10 mL/hr at 08/28/21 0600   heparin 1,300 Units/hr (08/28/21 0600)   PRN Meds: sodium chloride, sodium chloride, acetaminophen, atropine, ondansetron (ZOFRAN) IV, mouth rinse, sodium chloride flush, sodium chloride flush   Vital Signs    Vitals:   08/28/21 0500 08/28/21 0530 08/28/21 0600 08/28/21 0630  BP: 133/76  (!) 138/58   Pulse: (!) 49 (!) 54 (!) 51 (!) 49  Resp: '20 16 16 20  '$ Temp:      TempSrc:      SpO2: 96% 96% 97% 96%  Weight:      Height:        Intake/Output Summary (Last 24 hours) at 08/28/2021 0807 Last data filed at 08/28/2021 0600 Gross  per 24 hour  Intake 207.26 ml  Output 1275 ml  Net -1067.74 ml      08/27/2021    6:00 AM 08/24/2021    2:38 PM 08/02/2021   11:02 AM  Last 3 Weights  Weight (lbs) 356 lb 14.8 oz 336 lb 353 lb  Weight (kg) 161.9 kg 152.409 kg 160.12 kg      Telemetry    Atrial fibrillation, ventricular pacing- Personally Reviewed  ECG    Atrial fibrillation with very slow ventricular rates, right bundle branch block type IVCD with QRS duration 190 ms- Personally Reviewed  Physical Exam  Obese, appears very comfortable GEN: No acute distress.   Neck: Right IJ transvenous pacemaker.  Unable to evaluate jugular veins due to body habitus.  No bleeding at the site. Cardiac: RRR, split second heart sound, no murmurs, rubs, or gallops.  Respiratory: Clear to auscultation bilaterally. GI: Soft, nontender, non-distended  MS: Bilateral 3+ lower extremity edema; No deformity. Neuro:  Nonfocal  Psych: Normal affect   Labs    High Sensitivity Troponin:   Recent Labs  Lab 08/26/21 1253 08/26/21 1546  TROPONINIHS 17 18*     Chemistry Recent Labs  Lab 08/26/21 1253 08/27/21 0633 08/27/21 1521 08/27/21 1528 08/28/21 0439  NA 137 135 147* 140  139 137  K 5.0 4.8 3.7 4.7  4.9 5.6*  CL 105 102  --   --  105  CO2 22 21*  --   --  25  GLUCOSE 109* 90  --   --  104*  BUN 20 25*  --   --  25*  CREATININE 1.51* 1.68*  --   --  1.48*  CALCIUM 8.9 9.0  --   --  9.0  MG 1.8  --   --   --   --   PROT 6.8  --   --   --   --   ALBUMIN 3.3*  --   --   --   --   AST 21  --   --   --   --   ALT 10  --   --   --   --   ALKPHOS 120  --   --   --   --   BILITOT 1.8*  --   --   --   --   GFRNONAA 51* 45*  --   --  52*  ANIONGAP 10 12  --   --  7    Lipids  Recent Labs  Lab 08/28/21 0439  CHOL 109  TRIG 56  HDL 21*  LDLCALC 77  CHOLHDL 5.2    Hematology Recent Labs  Lab 08/26/21 1253 08/27/21 1521 08/27/21 1528  WBC 6.9  --   --   RBC 4.33  --   --   HGB 13.0 11.2* 12.9*  13.3  HCT  41.3 33.0* 38.0*  39.0  MCV 95.4  --   --   MCH 30.0  --   --   MCHC 31.5  --   --   RDW 17.2*  --   --   PLT 129*  --   --    Thyroid  Recent Labs  Lab 08/26/21 1300 08/26/21 1546  TSH 5.805*  --   FREET4  --  2.21*    BNP Recent Labs  Lab 08/26/21 1259  BNP 655.7*    DDimer No results for input(s): "DDIMER" in the last 168 hours.   Radiology    CARDIAC CATHETERIZATION  Result Date: 08/27/2021   Mid RCA lesion is 40% stenosed.   RPDA lesion is 95% stenosed.   Ost LM lesion is 30% stenosed.   Mid LAD lesion is 30% stenosed.   2nd Diag lesion is 50% stenosed.   Mid Cx lesion is 30% stenosed. Findings: Ao = 109/58 (78) LV = 119/19 RA =  21 RV = 57/19 PA = 56/22 (36) PCW = 24 (v = 30) Fick cardiac output/index = 7.3/2.7 PVR = 1.8 WU Ao sat = 99% PA sat = 68%, 69% PAPi = 2.1 Assessment: 1. Mostly non-obstructive CAD with high-grade lesion in mid to distal RPDA 2. Nonischemic CM 3. Mild to moderate pulmonary venous HTN with evidence of RV dysfunction Plan/Discussion: Continue medical therapy. If patient develops angina can consider PCI of PDA; otherwise medical therapy. Glori Bickers, MD 4:34 PM  DG CHEST PORT 1 VIEW  Result Date: 08/27/2021 CLINICAL DATA:  Heart block, temporary pacemaker EXAM: PORTABLE CHEST 1 VIEW COMPARISON:  08/26/2021 FINDINGS: Single frontal view of the chest demonstrates right internal jugular transvenous pacer, tip overlying region of the right ventricle. Cardiac silhouette is enlarged but stable. Continued central vascular congestion, without focal consolidation, effusion, or pneumothorax. External defibrillator pads overlie the left chest. IMPRESSION: 1. Transvenous pacer via  right internal jugular approach tip overlying the right ventricle. 2. Continued central vascular congestion without overt edema. Electronically Signed   By: Randa Ngo M.D.   On: 08/27/2021 08:20   CARDIAC CATHETERIZATION  Result Date: 08/27/2021 1.  Successful temporary  pacemaker placement with a backup rate of 50 bpm, an output of 10 mA, and a threshold of 0.6 mA. 2.  Successful right radial arterial line. Recommendation: Continued observation in the 2 Heart unit and EP consultation in the morning.   DG CHEST PORT 1 VIEW  Result Date: 08/26/2021 CLINICAL DATA:  Near syncopal episode.  CHF. EXAM: PORTABLE CHEST 1 VIEW COMPARISON:  07/22/2020 FINDINGS: Cardiac pads overlying the left side of the chest. Low lung volumes. Heart is enlarged for size. Again noted are prominent central vascular markings. Slightly prominent interstitial markings in right suprahilar region appear chronic. Negative for a pneumothorax. IMPRESSION: 1. Cardiomegaly with low lung volumes. 2. Prominent central vascular markings without overt pulmonary edema. Electronically Signed   By: Markus Daft M.D.   On: 08/26/2021 14:49    Cardiac Studies   ECHO 08/23/2021    1. AFIB with occasional pauses noted. Left ventricular ejection fraction,  by estimation, is 30 to 35%. The left ventricle has moderately decreased  function. The left ventricle demonstrates global hypokinesis. There is  severe left ventricular  hypertrophy. Left ventricular diastolic parameters were normal.   2. Right ventricular systolic function is moderately reduced. The right  ventricular size is moderately enlarged. There is mildly elevated  pulmonary artery systolic pressure. The estimated right ventricular  systolic pressure is 12.4 mmHg.   3. Left atrial size was mildly dilated.   4. Right atrial size was mildly dilated.   5. The mitral valve is normal in structure. Mild mitral valve  regurgitation. No evidence of mitral stenosis.   6. Tricuspid valve regurgitation is moderate.   7. The aortic valve is tricuspid. There is mild calcification of the  aortic valve. There is mild thickening of the aortic valve. Aortic valve  regurgitation is not visualized. Aortic valve sclerosis is present, with  no evidence of aortic  valve stenosis.   8. The inferior vena cava is dilated in size with >50% respiratory  variability, suggesting right atrial pressure of 8 mmHg.   Comparison(s): Prior images reviewed side by side.    Cardiac Cath 08/27/2021  Dominance: Right       Mid RCA lesion is 40% stenosed.   RPDA lesion is 95% stenosed.   Ost LM lesion is 30% stenosed.   Mid LAD lesion is 30% stenosed.   2nd Diag lesion is 50% stenosed.   Mid Cx lesion is 30% stenosed.   Findings:   Ao = 109/58 (78) LV = 119/19 RA =  21 RV = 57/19 PA = 56/22 (36) PCW = 24 (v = 30) Fick cardiac output/index = 7.3/2.7 PVR = 1.8 WU Ao sat = 99% PA sat = 68%, 69% PAPi = 2.1   Assessment: 1. Mostly non-obstructive CAD with high-grade lesion in mid to distal RPDA 2. Nonischemic CM  3. Mild to moderate pulmonary venous HTN with evidence of RV dysfunction   Plan/Discussion:   Continue medical therapy. If patient develops angina can consider PCI of PDA; otherwise medical therapy.  Patient Profile     66 y.o. male presenting with near syncope due to severe bradycardia/pauses in the setting of atrial fibrillation with slow ventricular response, moderate to severe nonischemic cardiomyopathy with LVEF 30-35% and severe right ventricular  dysfunction, permanent atrial fibrillation, diffuse (mostly nonobstructive) coronary artery disease, OSA compliant with CPAP, suspected obesity hypoventilation syndrome, diabetes mellitus type 2, chronic edema of the lower extremities with venous stasis ulcers  Assessment & Plan    1.  High grade atrioventricular block: At lower rate limit of 50 bpm still has 100% ventricular pacing and long pauses when the pacer rate is taken slower.  Last dose of carvedilol was morning of 08/26/2021.  It appears that he will need a permanent pacing device implanted.  Since he has a very broad QRS complex and severely depressed left ventricular systolic function he would benefit from implantation of a  biventricular pacemaker defibrillator (CRT-D).   Beta-blockers should not be restarted until he has a backup cardiac rhythm device.  Consider SGLT2 inhibitors once renal function stabilizes. 2.  Atrial fibrillation: Present at least since 2019.  Continue intravenous heparin until he receives his cardiac rhythm device and then resume Eliquis  3.  Acute on chronic systolic heart failure: He is clearly hypervolemic but has predominant right heart failure findings with severe lower extremity edema, dyspnea has improved after pacemaker implantation.  Although he does have some CAD, this cannot explain the severity of LV dysfunction, the cardiomyopathy is nonischemic.  Cardiomyopathy was first noted after critical respiratory illness in 2019, with some recovery of EF to mildly depressed range in 2020, but now with severe reduction in left ventricular systolic function.  He also has a history of chemotherapy with potential cardiotoxic effects (R-CHOP for lymphoma in 2011). 4. AKI: Likely due to reduced cardiac output secondary to severe bradycardia/cardiomyopathy.  Improving, but with mild hyperkalemia today.  We will hold off restarting Entresto for another day.  Baseline creatinine seems to be around 1.2.   Avoid all nephrotoxic drugs.  5. IVCD: He has a very broad QRS complex at 170-195 ms.  Despite the fact that this looks more like an atypical right bundle branch block, I suspect that he will derive significant benefit from CRT nonetheless.  He will need biventricular device anyway since he is likely to paced 100% of the time. 6. CAD: Has a high-grade stenosis in the PDA artery, but otherwise just diffuse plaque.  Revascularization is not indicated in the absence of angina.  Focus on risk factor modification.  LDL cholesterol not terrible at 77, but should shoot for target less than 70.  HDL is severely decreased at only 21. 7. DM: Well-controlled with recent hemoglobin A1c 6.0%.  CRITICAL CARE Performed by:  Dani Gobble Epifania Littrell   Total critical care time: 45 minutes  Critical care time was exclusive of separately billable procedures and treating other patients.  Critical care was necessary to treat or prevent imminent or life-threatening deterioration.  Critical care was time spent personally by me on the following activities: development of treatment plan with patient and/or surrogate as well as nursing, discussions with consultants, evaluation of patient's response to treatment, examination of patient, obtaining history from patient or surrogate, ordering and performing treatments and interventions, ordering and review of laboratory studies, ordering and review of radiographic studies, pulse oximetry and re-evaluation of patient's condition.   For questions or updates, please contact [place] Please consult [email or web address] for contact info under        Signed, Sanda Klein, MD  08/28/2021, 8:07 AM

## 2021-08-28 NOTE — Progress Notes (Signed)
ANTICOAGULATION CONSULT NOTE - Follow Up Consult  Pharmacy Consult for IV Heparin Indication: atrial fibrillation  Allergies  Allergen Reactions   Latex Hives and Swelling   Ace Inhibitors     Other reaction(s): cough   Penicillin G     Other reaction(s): rash Has tolerated amoxicillin    Patient Measurements: Height: '5\' 11"'$  (180.3 cm) Weight: (!) 161.9 kg (356 lb 14.8 oz) IBW/kg (Calculated) : 75.3 Heparin Dosing Weight: 111.6 kg  Vital Signs: Temp: 97.8 F (36.6 C) (08/19 0400) Temp Source: Oral (08/19 0400) BP: 129/46 (08/19 1000) Pulse Rate: 50 (08/19 1000)  Labs: Recent Labs    08/26/21 1253 08/26/21 1546 08/27/21 0633 08/27/21 1521 08/27/21 1528 08/27/21 2029 08/28/21 0439 08/28/21 0646  HGB 13.0  --   --  11.2* 12.9*  13.3  --   --   --   HCT 41.3  --   --  33.0* 38.0*  39.0  --   --   --   PLT 129*  --   --   --   --   --   --   --   APTT  --   --   --   --   --  37* 136* 51*  HEPARINUNFRC  --   --   --   --   --  >1.10*  --   --   CREATININE 1.51*  --  1.68*  --   --   --  1.48*  --   TROPONINIHS 17 18*  --   --   --   --   --   --      Estimated Creatinine Clearance: 76.3 mL/min (A) (by C-G formula based on SCr of 1.48 mg/dL (H)).   Medical History: Past Medical History:  Diagnosis Date   Acute systolic HF (heart failure) (HCC)    Arthritis    lt foot, hips knees    Atrial fibrillation (HCC)    Diabetes mellitus, new onset ([place])    Diverticulosis    Dyslipidemia    Gout attack 01/16/2012   Hypertension    Internal hemorrhoids    Lymphoma ([place])    remission for about 2 years, chemo 3 years prior   Pneumonia due to COVID-19 virus 07/2018   Pulmonary hypertension ([place])    Sepsis ([place]) 02/2017   secondary to influenza; requiring trach   Tubular adenoma of colon    Venous stasis ulcers ([place])     Assessment: 66 years of age male on Apixaban prior to admission for atrial fibrillation. Last dose of Apixaban was on 8/17 AM. Pharmacy  consulted post cath to start IV Heparin 2 hr after TR band removal.   Heparin drip 1300 uts/hr aptt 51 sec < goal Cbc stable no bleeding  Follow up planning PPM and restart DOAC  Goal of Therapy:  Heparin level 0.3-0.7 units/ml aPTT 66-102 seconds Monitor platelets by anticoagulation protocol: Yes   Plan:  Increase  Heparin at 1450 units/hr.  Daily heparin level, aPTT and CBC.    Bonnita Nasuti Pharm.D. CPP, BCPS Clinical Pharmacist [phone] 08/28/2021 3:38 PM   Please refer to AMION for [place] numbers 08/28/2021,3:37 PM

## 2021-08-28 NOTE — TOC Initial Note (Signed)
Transition of Care [facility]) - Initial/Assessment Note    Patient Details  Name: [name] MRN: [id number] Date of Birth: 28-Mar-1955  Transition of Care [facility]) CM/SW Contact:    Erenest Rasher, RN Phone Number: [phone] 08/28/2021, 8:15 AM  Clinical Narrative:                  HF TOC CM in room to speak to pt and pt at procedure. Sister states pt lives independently PTA. Drives to appt and works. Explained CM will continue to follow for dc needs.     Expected Discharge Plan: Home/Self Care Barriers to Discharge: Continued Medical Work up   Patient Goals and CMS Choice        Expected Discharge Plan and Services Expected Discharge Plan: Home/Self Care   Discharge Planning Services: CM Consult   Living arrangements for the past 2 months: Single Family Home                                      Prior Living Arrangements/Services Living arrangements for the past 2 months: Single Family Home Lives with:: Self          Need for Family Participation in Patient Care: No (Comment) Care giver support system in place?: Yes (comment)   Criminal Activity/Legal Involvement Pertinent to Current Situation/Hospitalization: No - Comment as needed  Activities of Daily Living      Permission Sought/Granted Permission sought to share information with : Case Manager, PCP, Family Supports Permission granted to share information with : Yes, Verbal Permission Granted  Share Information with NAME: Margaretmary Eddy     Permission granted to share info w Relationship: sister  Permission granted to share info w Contact Information: [phone]  Emotional Assessment           Psych Involvement: No (comment)  Admission diagnosis:  Bradycardia [K48.1] Systolic and diastolic CHF, chronic (HCC) [I50.42] Acute on chronic systolic (congestive) heart failure (HCC) [I50.23] Patient Active Problem List   Diagnosis Date Noted   Systolic and diastolic CHF, chronic ([place])  08/26/2021   Acute on chronic systolic (congestive) heart failure ([place]) 08/26/2021   Type 2 diabetes mellitus with peripheral angiopathy ([place]) 03/08/2021   Hypercoagulable state ([place]) 03/08/2021   Traumatic amputation of toe or toes without complication ([place]) 85/63/1497   Hyponatremia    Chronic systolic congestive heart failure ([place])    Prediabetes    Essential hypertension    Postoperative pain    Toe ulcer, right ([place]) 08/21/2020   Status post transmetatarsal amputation of left foot ([place]) 08/18/2020   PAD (peripheral artery disease) ([place]) 08/15/2020   S/P transmetatarsal amputation of foot, right ([place]) 08/11/2020   Gangrene ([place]) 08/04/2020   Permanent atrial fibrillation ([place]) 02/63/7858   Chronic systolic HF (heart failure) ([place]) 07/21/2019   Cough 07/12/2019   Educated about COVID-19 virus infection 05/12/2019   Non-pressure chronic ulcer of other part of left foot with unspecified severity ([place]) 04/11/2019   Pain in limb 04/11/2019   Adverse effect of other vaccines and biological substances, initial encounter 04/10/2019   COVID-19 virus infection 08/07/2018   Recurrent pneumonia 02/26/2018   Port-A-Cath in place 01/08/2018   History of colonic polyps 12/11/2017   Thrombophilia ([place]) 10/30/2017   Heart failure ([place]) 07/04/2017   Dyspnea on exertion 06/19/2017   Chronic anticoagulation 05/22/2017   Cardiomyopathy ([place]) 05/22/2017   Hypotension 05/22/2017  RBBB 05/22/2017   UTI due to extended-spectrum beta lactamase (ESBL) producing Escherichia coli 04/06/2017   Debility    Wound infection    OSA (obstructive sleep apnea)    Acute systolic heart failure (HCC)    Morbid obesity ([place])    Diabetes mellitus, new onset ([place])    Dysuria    Acute respiratory distress syndrome (ARDS) (HCC)    Influenza A with pneumonia    Anxiety    Current moderate episode of major depressive disorder without prior episode (HCC)    Obesity, Class III, BMI 40-49.9 (morbid obesity) ([place])     Community acquired pneumonia    Critical illness myopathy    Dysphagia    Non-Hodgkin's lymphoma ([place])    Benign essential HTN    Leukocytosis    Hypokalemia    Tracheostomy status (HCC)    Hypoxic    Pressure injury of skin 02/28/2017   Status post tracheostomy ([place])    Follicular lymphoma grade II of intrathoracic lymph nodes ([place]) 05/02/2016   Portacath in place 05/04/2015   Localized, primary osteoarthritis of hand 09/08/2014   Atherosclerotic heart disease of native coronary artery without angina pectoris 08/26/2013   Tobacco user 07/30/2012   Gout attack 01/16/2012   Psoriasis 15/40/0867   Follicular lymphoma grade II ([place]) 12/16/2010   Personal history of non-Hodgkin lymphomas 09/20/2010   Peripheral venous insufficiency 11/16/2009   Encounter for general adult medical examination without abnormal findings 06/16/2009   Hyperlipidemia 06/16/2009   Impaired fasting glucose 06/16/2009   Proteinuria 06/16/2009   Varicose veins of other specified sites 06/16/2009   PCP:  Ginger Organ., MD Pharmacy:   [id number] [place] [id number]: [phone]: [phone]    Social Determinants of Health (SDOH) Interventions    Readmission Risk Interventions     No data to display

## 2021-08-28 NOTE — Plan of Care (Signed)
Patient remains on transvenous pacer. Still requiring 4L/min of supplemental O2. Plan for permanent pacemaker on Monday.   Problem: Education: Goal: Ability to describe self-care measures that may prevent or decrease complications (Diabetes Survival Skills Education) will improve Outcome: Progressing Goal: Individualized Educational Video(s) Outcome: Progressing   Problem: Coping: Goal: Ability to adjust to condition or change in health will improve Outcome: Progressing   Problem: Fluid Volume: Goal: Ability to maintain a balanced intake and output will improve Outcome: Progressing   Problem: Health Behavior/Discharge Planning: Goal: Ability to identify and utilize available resources and services will improve Outcome: Progressing Goal: Ability to manage health-related needs will improve Outcome: Progressing   Problem: Metabolic: Goal: Ability to maintain appropriate glucose levels will improve Outcome: Progressing   Problem: Nutritional: Goal: Maintenance of adequate nutrition will improve Outcome: Progressing Goal: Progress toward achieving an optimal weight will improve Outcome: Progressing   Problem: Skin Integrity: Goal: Risk for impaired skin integrity will decrease Outcome: Progressing   Problem: Tissue Perfusion: Goal: Adequacy of tissue perfusion will improve Outcome: Progressing   Problem: Education: Goal: Understanding of CV disease, CV risk reduction, and recovery process will improve Outcome: Progressing Goal: Individualized Educational Video(s) Outcome: Progressing   Problem: Activity: Goal: Ability to return to baseline activity level will improve Outcome: Progressing   Problem: Cardiovascular: Goal: Ability to achieve and maintain adequate cardiovascular perfusion will improve Outcome: Progressing Goal: Vascular access site(s) Level 0-1 will be maintained Outcome: Progressing   Problem: Health Behavior/Discharge Planning: Goal: Ability to  safely manage health-related needs after discharge will improve Outcome: Progressing   Problem: Education: Goal: Knowledge of General Education information will improve Description: Including pain rating scale, medication(s)/side effects and non-pharmacologic comfort measures Outcome: Progressing   Problem: Health Behavior/Discharge Planning: Goal: Ability to manage health-related needs will improve Outcome: Progressing   Problem: Clinical Measurements: Goal: Ability to maintain clinical measurements within normal limits will improve Outcome: Progressing Goal: Will remain free from infection Outcome: Progressing Goal: Diagnostic test results will improve Outcome: Progressing Goal: Respiratory complications will improve Outcome: Progressing Goal: Cardiovascular complication will be avoided Outcome: Progressing   Problem: Activity: Goal: Risk for activity intolerance will decrease Outcome: Progressing   Problem: Nutrition: Goal: Adequate nutrition will be maintained Outcome: Progressing   Problem: Coping: Goal: Level of anxiety will decrease Outcome: Progressing   Problem: Elimination: Goal: Will not experience complications related to bowel motility Outcome: Progressing Goal: Will not experience complications related to urinary retention Outcome: Progressing   Problem: Pain Managment: Goal: General experience of comfort will improve Outcome: Progressing   Problem: Safety: Goal: Ability to remain free from injury will improve Outcome: Progressing   Problem: Skin Integrity: Goal: Risk for impaired skin integrity will decrease Outcome: Progressing

## 2021-08-29 DIAGNOSIS — I5023 Acute on chronic systolic (congestive) heart failure: Secondary | ICD-10-CM | POA: Diagnosis not present

## 2021-08-29 DIAGNOSIS — I442 Atrioventricular block, complete: Secondary | ICD-10-CM

## 2021-08-29 DIAGNOSIS — N179 Acute kidney failure, unspecified: Secondary | ICD-10-CM | POA: Diagnosis not present

## 2021-08-29 DIAGNOSIS — I5042 Chronic combined systolic (congestive) and diastolic (congestive) heart failure: Secondary | ICD-10-CM | POA: Diagnosis not present

## 2021-08-29 DIAGNOSIS — I2781 Cor pulmonale (chronic): Secondary | ICD-10-CM

## 2021-08-29 DIAGNOSIS — I4821 Permanent atrial fibrillation: Secondary | ICD-10-CM | POA: Diagnosis not present

## 2021-08-29 DIAGNOSIS — I2721 Secondary pulmonary arterial hypertension: Secondary | ICD-10-CM

## 2021-08-29 LAB — BASIC METABOLIC PANEL
Anion gap: 7 (ref 5–15)
BUN: 18 mg/dL (ref 8–23)
CO2: 23 mmol/L (ref 22–32)
Calcium: 8.8 mg/dL — ABNORMAL LOW (ref 8.9–10.3)
Chloride: 107 mmol/L (ref 98–111)
Creatinine, Ser: 0.94 mg/dL (ref 0.61–1.24)
GFR, Estimated: >60 mL/min (ref >60)
Glucose, Bld: 110 mg/dL — ABNORMAL HIGH (ref 70–99)
Potassium: 4.4 mmol/L (ref 3.5–5.1)
Sodium: 137 mmol/L (ref 135–145)

## 2021-08-29 LAB — GLUCOSE, CAPILLARY
Glucose-Capillary: 122 mg/dL — ABNORMAL HIGH (ref 70–99)
Glucose-Capillary: 99 mg/dL (ref 70–99)
Glucose-Capillary: 97 mg/dL (ref 70–99)
Glucose-Capillary: 102 mg/dL — ABNORMAL HIGH (ref 70–99)

## 2021-08-29 LAB — CBC
HCT: 39.8 % (ref 39.0–52.0)
Hemoglobin: 12.6 g/dL — ABNORMAL LOW (ref 13.0–17.0)
MCH: 30.1 pg (ref 26.0–34.0)
MCHC: 31.7 g/dL (ref 30.0–36.0)
MCV: 95.2 fL (ref 80.0–100.0)
Platelets: 108 10*3/uL — ABNORMAL LOW (ref 150–400)
RBC: 4.18 MIL/uL — ABNORMAL LOW (ref 4.22–5.81)
RDW: 17.2 % — ABNORMAL HIGH (ref 11.5–15.5)
WBC: 6.5 10*3/uL (ref 4.0–10.5)
nRBC: 0 % (ref 0.0–0.2)

## 2021-08-29 LAB — APTT: aPTT: 101 seconds — ABNORMAL HIGH (ref 24–36)

## 2021-08-29 MED ORDER — ATORVASTATIN CALCIUM 40 MG PO TABS
40.0000 mg | ORAL_TABLET | Freq: Every day | ORAL | Status: DC
  Administered 2021-08-29 – 2021-09-03 (×6): 40 mg via ORAL
  Filled 2021-08-29 (×6): qty 1

## 2021-08-29 MED ORDER — HEPARIN (PORCINE) 25000 UT/250ML-% IV SOLN
1400.0000 [IU]/h | INTRAVENOUS | Status: DC
Start: 2021-08-29 — End: 2021-08-30
  Administered 2021-08-29: 1450 [IU]/h via INTRAVENOUS
  Administered 2021-08-30: 1400 [IU]/h via INTRAVENOUS
  Filled 2021-08-29 (×2): qty 250

## 2021-08-29 NOTE — Progress Notes (Signed)
Cardiology notified for systolic greater than 718. BP in 150s acceptable per Lovena Le MD. No new orders at this time.

## 2021-08-29 NOTE — Progress Notes (Signed)
ANTICOAGULATION CONSULT NOTE - Follow Up Consult  Pharmacy Consult for IV Heparin Indication: atrial fibrillation  Allergies  Allergen Reactions   Latex Hives and Swelling   Ace Inhibitors     Other reaction(s): cough   Penicillin G     Other reaction(s): rash Has tolerated amoxicillin    Patient Measurements: Height: '5\' 11"'$  (180.3 cm) Weight: (!) 161.9 kg (356 lb 14.8 oz) IBW/kg (Calculated) : 75.3 Heparin Dosing Weight: 111.6 kg  Vital Signs: Temp: 98.2 F (36.8 C) (08/20 1122) Temp Source: Oral (08/20 1122) BP: 146/63 (08/20 1100) Pulse Rate: 50 (08/20 1122)  Labs: Recent Labs    08/26/21 1253 08/26/21 1546 08/27/21 4235 08/27/21 1521 08/27/21 1528 08/27/21 2029 08/27/21 2029 08/28/21 0439 08/28/21 0646 08/29/21 0904  HGB 13.0  --   --  11.2* 12.9*  13.3  --   --   --   --  12.6*  HCT 41.3  --   --  33.0* 38.0*  39.0  --   --   --   --  39.8  PLT 129*  --   --   --   --   --   --   --   --  108*  APTT  --   --   --   --   --  37*   < > 136* 51* 101*  HEPARINUNFRC  --   --   --   --   --  >1.10*  --   --   --   --   CREATININE 1.51*  --  1.68*  --   --   --   --  1.48*  --  0.94  TROPONINIHS 17 18*  --   --   --   --   --   --   --   --    < > = values in this interval not displayed.     Estimated Creatinine Clearance: 120.2 mL/min (by C-G formula based on SCr of 0.94 mg/dL).   Medical History: Past Medical History:  Diagnosis Date   Acute systolic HF (heart failure) (HCC)    Arthritis    lt foot, hips knees    Atrial fibrillation (HCC)    Diabetes mellitus, new onset ([place])    Diverticulosis    Dyslipidemia    Gout attack 01/16/2012   Hypertension    Internal hemorrhoids    Lymphoma ([place])    remission for about 2 years, chemo 3 years prior   Pneumonia due to COVID-19 virus 07/2018   Pulmonary hypertension ([place])    Sepsis ([place]) 02/2017   secondary to influenza; requiring trach   Tubular adenoma of colon    Venous stasis ulcers ([place])      Assessment: 66 years of age male on Apixaban prior to admission for atrial fibrillation. Last dose of Apixaban was on 8/17 AM. Pharmacy consulted post cath to start IV Heparin 2 hr after TR band removal.   Heparin level at upper end of therapeutic range today.  CBC stable, no overt bleeding or complications noted.  Follow up planning PPM and restart DOAC  Goal of Therapy:  Heparin level 0.3-0.7 units/ml aPTT 66-102 seconds Monitor platelets by anticoagulation protocol: Yes   Plan:  Decrease IV heparin 1400 units/hr to keep in therapeutic range. Daily heparin level, aPTT and CBC.   [place] Crane, Roylene Reason, BCCP Clinical Pharmacist  08/29/2021 12:11 PM   [facility] pharmacy phone numbers are listed on [email or web address]

## 2021-08-29 NOTE — Consult Note (Signed)
Cardiology Consultation:   Patient ID: [name] MRN: [id number]; DOB: 15-Nov-1955  Admit date: 08/26/2021 Date of Consult: 08/29/2021  PCP:  Ginger Organ., MD   [facility] Providers Cardiologist:  Quay Burow, MD        Patient Profile:   [name] is a 66 y.o. male with a hx of HFREF, perm atrial fib, obesity, HTN, DM and sleep apnea who is being seen 08/29/2021 for the evaluation of complete heart block at the request of Dr Sallyanne Kuster.  History of Present Illness:   [name] is a very pleasant 66 year old man with multiple medical problems including chronic atrial fibrillation, chronic systolic heart failure with progressively worsening left ventricular dysfunction despite maximal medical therapy.  He presented to the hospital with symptomatic bradycardia with heart rates in the 20s in the setting of atrial fibrillation.  A temporary transvenous pacemaker was inserted and his beta-blocker was held.  This occurred approximately 3 days ago.  The patient remains pacemaker dependent with heart rates less than 30 beats a minute as he continued to pace when I temporarily turned his pacemaker down to 30.  The patient has been stabilized.  His heart failure symptoms are improved.  His ejection fraction is 30% by echo.  Previously he was on Entresto and a beta-blocker.  He has not had frank syncope.  He underwent cardiac catheterization with no culprit lesions.   Past Medical History:  Diagnosis Date   Acute systolic HF (heart failure) (HCC)    Arthritis    lt foot, hips knees    Atrial fibrillation (HCC)    Diabetes mellitus, new onset ([place])    Diverticulosis    Dyslipidemia    Gout attack 01/16/2012   Hypertension    Internal hemorrhoids    Lymphoma ([place])    remission for about 2 years, chemo 3 years prior   Pneumonia due to COVID-19 virus 07/2018   Pulmonary hypertension ([place])    Sepsis ([place]) 02/2017   secondary to influenza; requiring trach   Tubular adenoma of  colon    Venous stasis ulcers ([place])     Past Surgical History:  Procedure Laterality Date   ABDOMINAL AORTOGRAM W/LOWER EXTREMITY N/A 08/14/2020   Procedure: ABDOMINAL AORTOGRAM W/LOWER EXTREMITY;  Surgeon: Elam Dutch, MD;  Location: [place] [facility];  Service: Cardiovascular;  Laterality: N/A;   AMPUTATION TOE Left 05/08/2019   Procedure: AMPUTATION LEFT GREAT  TOE;  Surgeon: Trula Slade, DPM;  Location: [facility];  Service: Podiatry;  Laterality: Left;   ARTERIAL LINE INSERTION Right 08/26/2021   Procedure: ARTERIAL LINE INSERTION;  Surgeon: Early Osmond, MD;  Location: [place] [facility];  Service: Cardiovascular;  Laterality: Right;  rt radial   BRONCHOSCOPY     ESOPHAGOGASTRODUODENOSCOPY ENDOSCOPY     Dr Royden Purl HERNIA REPAIR Left    2001   IR REMOVAL TUN ACCESS W/ PORT W/O FL MOD SED  02/03/2020   PORTA CATH INSERTION  2011   TEMPORARY PACEMAKER N/A 08/26/2021   Procedure: TEMPORARY PACEMAKER;  Surgeon: Early Osmond, MD;  Location: [place] [facility];  Service: Cardiovascular;  Laterality: N/A;   TRACHEOSTOMY  03/2017   with decannulation   TRANSMETATARSAL AMPUTATION Left 08/05/2020   Procedure: TRANSMETATARSAL AMPUTATION;  Surgeon: Trula Slade, DPM;  Location: [place];  Service: Podiatry;  Laterality: Left;     Home Medications:  Prior to Admission medications   Medication Sig Start Date End Date  Taking? Authorizing Provider  acetaminophen (TYLENOL) 325 MG tablet Take 650 mg by mouth every 6 (six) hours as needed for moderate pain.    Yes [provider]  ascorbic acid (VITAMIN C) 500 MG tablet Take 1 tablet (500 mg total) by mouth 3 (three) times daily. Patient taking differently: Take 500 mg by mouth 2 (two) times daily. 08/26/20  Yes Angiulli, Lavon Paganini, PA-C  atorvastatin (LIPITOR) 20 MG tablet TAKE 1 TABLET BY MOUTH EVERY DAY *PT OVERDUE FOR OFFICE VISIT* 08/26/20  Yes Angiulli, Lavon Paganini, PA-C  cholecalciferol (VITAMIN D) 25 MCG (1000  UNIT) tablet Take 1 tablet (1,000 Units total) by mouth daily. 08/26/20  Yes Angiulli, Lavon Paganini, PA-C  colchicine 0.6 MG tablet Take 0.6 mg by mouth 2 (two) times daily as needed.   Yes [provider]  ELIQUIS 5 MG TABS tablet TAKE 1 TABLET BY MOUTH TWICE A DAY 05/04/21  Yes Hochrein, Jeneen Rinks, MD  KLOR-CON M20 20 MEQ tablet Take 1 tablet (20 mEq total) by mouth daily. 08/04/21  Yes Loel Dubonnet, NP  levofloxacin (LEVAQUIN) 750 MG tablet Take 750 mg by mouth daily. 08/12/21  Yes [provider]  losartan (COZAAR) 50 MG tablet Take 1 tablet (50 mg total) by mouth daily. 08/24/21  Yes Lorretta Harp, MD  Omega 3 340 MG CPDR Take 1 capsule (340 mg total) by mouth 2 (two) times daily. Patient taking differently: Take 340 mg by mouth daily. 08/26/20  Yes Angiulli, Lavon Paganini, PA-C  omeprazole (PRILOSEC) 40 MG capsule Take 1 capsule (40 mg total) by mouth daily. 08/26/20  Yes Angiulli, Lavon Paganini, PA-C  SANTYL 250 UNIT/GM ointment APPLY 1 APPLICATION. TOPICALLY DAILY. 07/05/21  Yes Trula Slade, DPM  sertraline (ZOLOFT) 50 MG tablet Take 1 tablet (50 mg total) by mouth daily. 08/26/20  Yes Angiulli, Lavon Paganini, PA-C  Tirzepatide [facility]) Inject 1 Dose into the skin once a week.   Yes [provider]  torsemide (DEMADEX) 20 MG tablet Take 2 tablets (40 mg total) by mouth 2 (two) times daily. Patient taking differently: Take 40 mg by mouth daily. 08/02/21  Yes Loel Dubonnet, NP  vitamin B-12 (CYANOCOBALAMIN) 1000 MCG tablet Take 1 tablet (1,000 mcg total) by mouth daily. 08/26/20  Yes Angiulli, Lavon Paganini, PA-C  albuterol (VENTOLIN HFA) 108 (90 Base) MCG/ACT inhaler Inhale 2 puffs into the lungs every 6 (six) hours as needed for wheezing or shortness of breath. Patient not taking: Reported on 08/26/2021 08/26/20   Angiulli, Lavon Paganini, PA-C  Continuous Blood Gluc Receiver (FREESTYLE LIBRE 2 READER) DEVI 1 each by Does not apply route daily. 10/05/20   Raulkar, Clide Deutscher, MD   Continuous Blood Gluc Sensor (FREESTYLE LIBRE 2 SENSOR) MISC 1 each by Does not apply route daily. 10/05/20   Raulkar, Clide Deutscher, MD  polycarbophil (FIBERCON) 625 MG tablet Take 1 tablet (625 mg total) by mouth daily. Patient not taking: Reported on 08/26/2021 08/26/20   Angiulli, Lavon Paganini, PA-C  sacubitril-valsartan (ENTRESTO) 24-26 MG Take 1 tablet by mouth 2 (two) times daily. 08/04/21   Loel Dubonnet, NP  carvedilol (COREG) 6.25 MG tablet Take 6.25 mg by mouth 2 (two) times daily. 09/13/20 10/05/20  [provider]  sodium chloride (OCEAN) 0.65 % SOLN nasal spray Place 2 sprays into both nostrils as needed for congestion. 04/05/17 10/05/20  Bary Leriche, PA-C    Inpatient Medications: Scheduled Meds:  aspirin  81 mg Oral Daily   atorvastatin  40 mg Oral QHS   Chlorhexidine Gluconate Cloth  6 each Topical Daily   insulin aspart  0-15 Units Subcutaneous TID WC   levofloxacin  750 mg Oral QHS   mupirocin ointment   Nasal BID   sertraline  50 mg Oral Daily   sodium chloride flush  3 mL Intravenous Q12H   sodium chloride flush  3 mL Intravenous Q12H   sodium chloride flush  3 mL Intravenous Q12H   Continuous Infusions:  sodium chloride     sodium chloride 20 mL/hr at 08/29/21 0800   sodium chloride 10 mL/hr at 08/29/21 0800   heparin 1,450 Units/hr (08/29/21 0800)   PRN Meds: sodium chloride, sodium chloride, acetaminophen, atropine, ondansetron (ZOFRAN) IV, mouth rinse, sodium chloride flush, sodium chloride flush  Allergies:    Allergies  Allergen Reactions   Latex Hives and Swelling   Ace Inhibitors     Other reaction(s): cough   Penicillin G     Other reaction(s): rash Has tolerated amoxicillin    Social History:   Social History   Socioeconomic History   Marital status: Divorced    Spouse name: Not on file   Number of children: Not on file   Years of education: Not on file   Highest education level: Not on file  Occupational History   Occupation:  cosmetologist  Tobacco Use   Smoking status: Former    Packs/day: 0.50    Years: 20.00    Total pack years: 10.00    Types: Cigarettes    Quit date: 01/10/2009    Years since quitting: 12.6   Smokeless tobacco: Never  Vaping Use   Vaping Use: Never used  Substance and Sexual Activity   Alcohol use: Not Currently    Comment: 2-3 times per week   Drug use: No   Sexual activity: Not on file  Other Topics Concern   Not on file  Social History Narrative   Not on file   Social Determinants of Health   Financial Resource Strain: Not on file  Food Insecurity: Not on file  Transportation Needs: Not on file  Physical Activity: Not on file  Stress: Not on file  Social Connections: Not on file  Intimate Partner Violence: Not on file    Family History:     Family History  Problem Relation Age of Onset   Dementia Brother 39       frontal lobe    Dementia Mother    Arthritis Mother    Hypertension Mother    CVA Sister 75   Colon cancer Neg Hx    Pancreatic cancer Neg Hx    Stomach cancer Neg Hx    Rectal cancer Neg Hx    Liver cancer Neg Hx      ROS:  Please see the history of present illness.   All other ROS reviewed and negative.     Physical Exam/Data:   Vitals:   08/29/21 0600 08/29/21 0700 08/29/21 0800 08/29/21 0820  BP: (!) 147/62 (!) 140/64 (!) 136/53   Pulse: (!) 50 (!) 50 (!) 50   Resp: '18 20 19   '$ Temp:    98.1 F (36.7 C)  TempSrc:    Oral  SpO2: 95% 95% 95%   Weight:      Height:        Intake/Output Summary (Last 24 hours) at 08/29/2021 1005 Last data filed at 08/29/2021 0800 Gross per 24 hour  Intake 3141.54 ml  Output 625 ml  Net 2516.54 ml      08/29/2021    5:00 AM 08/27/2021    6:00 AM 08/24/2021    2:38 PM  Last 3 Weights  Weight (lbs) 356 lb 14.8 oz 356 lb 14.8 oz 336 lb  Weight (kg) 161.9 kg 161.9 kg 152.409 kg     Body mass index is 49.78 kg/m.  General: Morbidly obese, well nourished, well developed, in no acute distress HEENT:  normal Neck: no JVD, indwelling temporary transvenous pacing wire in the right internal jugular vein Vascular: No carotid bruits; Distal pulses 2+ bilaterally Cardiac: Heart sounds are distant.  Regular bradycardia. Lungs:  clear to auscultation bilaterally, no wheezing, rhonchi or rales  Abd: soft, nontender, no hepatomegaly  Ext: no edema Musculoskeletal:  No deformities, BUE and BLE strength normal and equal Skin: warm and dry  Neuro:  CNs 2-12 intact, no focal abnormalities noted Psych:  Normal affect   EKG:  The EKG was personally reviewed and demonstrates: Atrial fibrillation with a slow ventricular response and a marked intraventricular conduction delay Telemetry:  Telemetry was personally reviewed and demonstrates: Atrial fibrillation with ventricular pacing at 50 bpm.  Relevant CV Studies: See above  Laboratory Data:  High Sensitivity Troponin:   Recent Labs  Lab 08/26/21 1253 08/26/21 1546  TROPONINIHS 17 18*     Chemistry Recent Labs  Lab 08/26/21 1253 08/27/21 0633 08/27/21 1521 08/27/21 1528 08/28/21 0439  NA 137 135 147* 140  139 137  K 5.0 4.8 3.7 4.7  4.9 5.6*  CL 105 102  --   --  105  CO2 22 21*  --   --  25  GLUCOSE 109* 90  --   --  104*  BUN 20 25*  --   --  25*  CREATININE 1.51* 1.68*  --   --  1.48*  CALCIUM 8.9 9.0  --   --  9.0  MG 1.8  --   --   --   --   GFRNONAA 51* 45*  --   --  52*  ANIONGAP 10 12  --   --  7    Recent Labs  Lab 08/26/21 1253  PROT 6.8  ALBUMIN 3.3*  AST 21  ALT 10  ALKPHOS 120  BILITOT 1.8*   Lipids  Recent Labs  Lab 08/28/21 0439  CHOL 109  TRIG 56  HDL 21*  LDLCALC 77  CHOLHDL 5.2    Hematology Recent Labs  Lab 08/26/21 1253 08/27/21 1521 08/27/21 1528  WBC 6.9  --   --   RBC 4.33  --   --   HGB 13.0 11.2* 12.9*  13.3  HCT 41.3 33.0* 38.0*  39.0  MCV 95.4  --   --   MCH 30.0  --   --   MCHC 31.5  --   --   RDW 17.2*  --   --   PLT 129*  --   --    Thyroid  Recent Labs  Lab  08/26/21 1300 08/26/21 1546  TSH 5.805*  --   FREET4  --  2.21*    BNP Recent Labs  Lab 08/26/21 1259  BNP 655.7*    DDimer No results for input(s): "DDIMER" in the last 168 hours.   Radiology/Studies:  CARDIAC CATHETERIZATION  Result Date: 08/27/2021   Mid RCA lesion is 40% stenosed.   RPDA lesion is 95% stenosed.   Ost LM lesion is 30% stenosed.   Mid LAD lesion is 30% stenosed.  2nd Diag lesion is 50% stenosed.   Mid Cx lesion is 30% stenosed. Findings: Ao = 109/58 (78) LV = 119/19 RA =  21 RV = 57/19 PA = 56/22 (36) PCW = 24 (v = 30) Fick cardiac output/index = 7.3/2.7 PVR = 1.8 WU Ao sat = 99% PA sat = 68%, 69% PAPi = 2.1 Assessment: 1. Mostly non-obstructive CAD with high-grade lesion in mid to distal RPDA 2. Nonischemic CM 3. Mild to moderate pulmonary venous HTN with evidence of RV dysfunction Plan/Discussion: Continue medical therapy. If patient develops angina can consider PCI of PDA; otherwise medical therapy. Glori Bickers, MD 4:34 PM  DG CHEST PORT 1 VIEW  Result Date: 08/27/2021 CLINICAL DATA:  Heart block, temporary pacemaker EXAM: PORTABLE CHEST 1 VIEW COMPARISON:  08/26/2021 FINDINGS: Single frontal view of the chest demonstrates right internal jugular transvenous pacer, tip overlying region of the right ventricle. Cardiac silhouette is enlarged but stable. Continued central vascular congestion, without focal consolidation, effusion, or pneumothorax. External defibrillator pads overlie the left chest. IMPRESSION: 1. Transvenous pacer via right internal jugular approach tip overlying the right ventricle. 2. Continued central vascular congestion without overt edema. Electronically Signed   By: Randa Ngo M.D.   On: 08/27/2021 08:20   CARDIAC CATHETERIZATION  Result Date: 08/27/2021 1.  Successful temporary pacemaker placement with a backup rate of 50 bpm, an output of 10 mA, and a threshold of 0.6 mA. 2.  Successful right radial arterial line. Recommendation:  Continued observation in the 2 Heart unit and EP consultation in the morning.   DG CHEST PORT 1 VIEW  Result Date: 08/26/2021 CLINICAL DATA:  Near syncopal episode.  CHF. EXAM: PORTABLE CHEST 1 VIEW COMPARISON:  07/22/2020 FINDINGS: Cardiac pads overlying the left side of the chest. Low lung volumes. Heart is enlarged for size. Again noted are prominent central vascular markings. Slightly prominent interstitial markings in right suprahilar region appear chronic. Negative for a pneumothorax. IMPRESSION: 1. Cardiomegaly with low lung volumes. 2. Prominent central vascular markings without overt pulmonary edema. Electronically Signed   By: Markus Daft M.D.   On: 08/26/2021 14:49     Assessment and Plan:   Complete heart block -the patient has not had recurrence of his AV conduction.  He will undergo permanent pacemaker insertion but with his severe left ventricular dysfunction biventricular ICD insertion as he will pace essentially 100% of the time. Chronic systolic heart failure -his ejection fraction is 30% despite maximal medical therapy.  Hopefully with resynchronization and an increase in his ventricular rate, his symptoms and his ejection fraction will improve. Morbid obesity -weight loss following device implantation will be encouraged.   For questions or updates, please contact [place] Please consult [email or web address] for contact info under    Signed, Cristopher Peru, MD  08/29/2021 10:05 AM

## 2021-08-29 NOTE — Progress Notes (Signed)
Progress Note  Patient Name: [name] Date of Encounter: 08/29/2021  [facility] Cardiologist: Quay Burow, MD   Subjective   Denies any problems with dyspnea, chest pain overnight. Continues to have 100% ventricular paced rhythm with temporary transvenous pacemaker set at 50 bpm.  Background atrial fibrillation with slow ventricular response and pauses.  Inpatient Medications    Scheduled Meds:  aspirin  81 mg Oral Daily   atorvastatin  20 mg Oral QHS   Chlorhexidine Gluconate Cloth  6 each Topical Daily   insulin aspart  0-15 Units Subcutaneous TID WC   levofloxacin  750 mg Oral QHS   mupirocin ointment   Nasal BID   sertraline  50 mg Oral Daily   sodium chloride flush  3 mL Intravenous Q12H   sodium chloride flush  3 mL Intravenous Q12H   sodium chloride flush  3 mL Intravenous Q12H   Continuous Infusions:  sodium chloride     sodium chloride 20 mL/hr at 08/29/21 0800   sodium chloride 10 mL/hr at 08/29/21 0800   heparin 1,450 Units/hr (08/29/21 0800)   PRN Meds: sodium chloride, sodium chloride, acetaminophen, atropine, ondansetron (ZOFRAN) IV, mouth rinse, sodium chloride flush, sodium chloride flush   Vital Signs    Vitals:   08/29/21 0600 08/29/21 0700 08/29/21 0800 08/29/21 0820  BP: (!) 147/62 (!) 140/64 (!) 136/53   Pulse: (!) 50 (!) 50 (!) 50   Resp: '18 20 19   '$ Temp:    98.1 F (36.7 C)  TempSrc:    Oral  SpO2: 95% 95% 95%   Weight:      Height:        Intake/Output Summary (Last 24 hours) at 08/29/2021 0923 Last data filed at 08/29/2021 0800 Gross per 24 hour  Intake 3254.55 ml  Output 925 ml  Net 2329.55 ml      08/29/2021    5:00 AM 08/27/2021    6:00 AM 08/24/2021    2:38 PM  Last 3 Weights  Weight (lbs) 356 lb 14.8 oz 356 lb 14.8 oz 336 lb  Weight (kg) 161.9 kg 161.9 kg 152.409 kg      Telemetry    100% ventricular paced rhythm, background atrial fibrillation- Personally Reviewed  ECG    No new tracing- Personally  Reviewed  Physical Exam  Appears comfortable.  Morbidly obese. GEN: No acute distress.   Neck: Unable to evaluate JVD due to body habitus and right IJ pacemaker Cardiac: RRR, no murmurs, rubs, or gallops.  Respiratory: Clear to auscultation bilaterally. GI: Soft, nontender, non-distended  MS: Bilateral symmetrical 3+ edema; No deformity. Neuro:  Nonfocal  Psych: Normal affect   Labs    High Sensitivity Troponin:   Recent Labs  Lab 08/26/21 1253 08/26/21 1546  TROPONINIHS 17 18*     Chemistry Recent Labs  Lab 08/26/21 1253 08/27/21 0633 08/27/21 1521 08/27/21 1528 08/28/21 0439  NA 137 135 147* 140  139 137  K 5.0 4.8 3.7 4.7  4.9 5.6*  CL 105 102  --   --  105  CO2 22 21*  --   --  25  GLUCOSE 109* 90  --   --  104*  BUN 20 25*  --   --  25*  CREATININE 1.51* 1.68*  --   --  1.48*  CALCIUM 8.9 9.0  --   --  9.0  MG 1.8  --   --   --   --   PROT 6.8  --   --   --   --  ALBUMIN 3.3*  --   --   --   --   AST 21  --   --   --   --   ALT 10  --   --   --   --   ALKPHOS 120  --   --   --   --   BILITOT 1.8*  --   --   --   --   GFRNONAA 51* 45*  --   --  52*  ANIONGAP 10 12  --   --  7    Lipids  Recent Labs  Lab 08/28/21 0439  CHOL 109  TRIG 56  HDL 21*  LDLCALC 77  CHOLHDL 5.2    Hematology Recent Labs  Lab 08/26/21 1253 08/27/21 1521 08/27/21 1528  WBC 6.9  --   --   RBC 4.33  --   --   HGB 13.0 11.2* 12.9*  13.3  HCT 41.3 33.0* 38.0*  39.0  MCV 95.4  --   --   MCH 30.0  --   --   MCHC 31.5  --   --   RDW 17.2*  --   --   PLT 129*  --   --    Thyroid  Recent Labs  Lab 08/26/21 1300 08/26/21 1546  TSH 5.805*  --   FREET4  --  2.21*    BNP Recent Labs  Lab 08/26/21 1259  BNP 655.7*    DDimer No results for input(s): "DDIMER" in the last 168 hours.   Radiology    CARDIAC CATHETERIZATION  Result Date: 08/27/2021   Mid RCA lesion is 40% stenosed.   RPDA lesion is 95% stenosed.   Ost LM lesion is 30% stenosed.   Mid LAD lesion  is 30% stenosed.   2nd Diag lesion is 50% stenosed.   Mid Cx lesion is 30% stenosed. Findings: Ao = 109/58 (78) LV = 119/19 RA =  21 RV = 57/19 PA = 56/22 (36) PCW = 24 (v = 30) Fick cardiac output/index = 7.3/2.7 PVR = 1.8 WU Ao sat = 99% PA sat = 68%, 69% PAPi = 2.1 Assessment: 1. Mostly non-obstructive CAD with high-grade lesion in mid to distal RPDA 2. Nonischemic CM 3. Mild to moderate pulmonary venous HTN with evidence of RV dysfunction Plan/Discussion: Continue medical therapy. If patient develops angina can consider PCI of PDA; otherwise medical therapy. Glori Bickers, MD 4:34 PM   Cardiac Studies   ECHO 08/23/2021     1. AFIB with occasional pauses noted. Left ventricular ejection fraction,  by estimation, is 30 to 35%. The left ventricle has moderately decreased  function. The left ventricle demonstrates global hypokinesis. There is  severe left ventricular  hypertrophy. Left ventricular diastolic parameters were normal.   2. Right ventricular systolic function is moderately reduced. The right  ventricular size is moderately enlarged. There is mildly elevated  pulmonary artery systolic pressure. The estimated right ventricular  systolic pressure is 31.4 mmHg.   3. Left atrial size was mildly dilated.   4. Right atrial size was mildly dilated.   5. The mitral valve is normal in structure. Mild mitral valve  regurgitation. No evidence of mitral stenosis.   6. Tricuspid valve regurgitation is moderate.   7. The aortic valve is tricuspid. There is mild calcification of the  aortic valve. There is mild thickening of the aortic valve. Aortic valve  regurgitation is not visualized. Aortic valve sclerosis is present, with  no evidence  of aortic valve stenosis.   8. The inferior vena cava is dilated in size with >50% respiratory  variability, suggesting right atrial pressure of 8 mmHg.   Comparison(s): Prior images reviewed side by side.      Cardiac Cath 08/27/2021   Dominance:  Right          Mid RCA lesion is 40% stenosed.   RPDA lesion is 95% stenosed.   Ost LM lesion is 30% stenosed.   Mid LAD lesion is 30% stenosed.   2nd Diag lesion is 50% stenosed.   Mid Cx lesion is 30% stenosed.   Findings:   Ao = 109/58 (78) LV = 119/19 RA =  21 RV = 57/19 PA = 56/22 (36) PCW = 24 (v = 30) Fick cardiac output/index = 7.3/2.7 PVR = 1.8 WU Ao sat = 99% PA sat = 68%, 69% PAPi = 2.1   Assessment: 1. Mostly non-obstructive CAD with high-grade lesion in mid to distal RPDA 2. Nonischemic CM  3. Mild to moderate pulmonary venous HTN with evidence of RV dysfunction   Plan/Discussion:   Continue medical therapy. If patient develops angina can consider PCI of PDA; otherwise medical therapy.  Patient Profile     66 y.o. male male presenting with near syncope due to severe bradycardia/pauses in the setting of atrial fibrillation with slow ventricular response, moderate to severe nonischemic cardiomyopathy with LVEF 30-35% and severe right ventricular dysfunction, permanent atrial fibrillation, diffuse (mostly nonobstructive) coronary artery disease, OSA compliant with CPAP, suspected obesity hypoventilation syndrome, diabetes mellitus type 2, chronic edema of the lower extremities with venous stasis ulcers  Assessment & Plan    1.  High grade atrioventricular block: Persist despite more than 72 hours since last dose of carvedilol.  Plan for biventricular ICD tomorrow.  Beta-blockers should not be restarted until he has a backup cardiac rhythm device.   2.  Atrial fibrillation: Present at least since 2019.  After his device is implanted can resume Eliquis. 3.  Acute on chronic systolic heart failure: He is clearly hypervolemic but has predominant right heart failure findings with severe lower extremity edema, dyspnea has improved after pacemaker implantation.  Although he does have some CAD, this cannot explain the severity of LV dysfunction, the cardiomyopathy is  nonischemic.  Cardiomyopathy was first noted after critical respiratory illness in 2019, with some recovery of EF to mildly depressed range in 2020, but now with severe reduction in left ventricular systolic function.  He also has a history of chemotherapy with potential cardiotoxic effects (R-CHOP for lymphoma in 2011).  Once renal function back to baseline, plan to start Entresto and SGLT2 inhibitor possibly also spironolactone (but he appears to have a tendency to hyperkalemia).  Can also resume carvedilol once we have reliable ventricular pacing. 4. AKI: Likely due to reduced cardiac output secondary to severe bradycardia/cardiomyopathy.  Improving trend, labs pending today. Baseline creatinine seems to be around 1.2.   Avoid all nephrotoxic drugs.  5. IVCD: very broad QRS. Will get a CRT device. 6. CAD: Has a high-grade stenosis in the PDA artery, but otherwise just diffuse plaque.  Revascularization is not indicated in the absence of angina.  Focus on risk factor modification.   7. HLP: Increase atorvastatin to 40 mg daily.  LDL cholesterol not terrible at 77, but should shoot for target less than 70.  HDL is severely decreased at only 21. 8. DM: Well-controlled with recent hemoglobin A1c 6.0%. 9. OSA: He reports 100% compliance with CPAP ever  since the diagnosis was made many years ago.  Despite this he has prominent findings of pulmonary hypertension and right heart failure.  Need to review CPAP settings.  Suspect he may also have obesity hypoventilation syndrome and may need oxygen 24 hours a day. 10.  Morbid obesity  CRITICAL CARE Performed by: Dani Gobble Hajar Penninger   Total critical care time: 40 minutes  Critical care time was exclusive of separately billable procedures and treating other patients.  Critical care was necessary to treat or prevent imminent or life-threatening deterioration.  Critical care was time spent personally by me on the following activities: development of treatment plan  with patient and/or surrogate as well as nursing, discussions with consultants, evaluation of patient's response to treatment, examination of patient, obtaining history from patient or surrogate, ordering and performing treatments and interventions, ordering and review of laboratory studies, ordering and review of radiographic studies, pulse oximetry and re-evaluation of patient's condition.   For questions or updates, please contact [place] Please consult [email or web address] for contact info under        Signed, Sanda Klein, MD  08/29/2021, 9:23 AM

## 2021-08-30 ENCOUNTER — Encounter (HOSPITAL_COMMUNITY): Payer: Self-pay | Admitting: Internal Medicine

## 2021-08-30 ENCOUNTER — Encounter (HOSPITAL_COMMUNITY)
Admission: EM | Disposition: A | Discharge status: Home Health | Payer: Self-pay | Source: Home / Self Care | Attending: Cardiovascular Disease

## 2021-08-30 ENCOUNTER — Institutional Professional Consult (permissible substitution): Payer: Medicare Other | Admitting: Pulmonary Disease

## 2021-08-30 DIAGNOSIS — I5023 Acute on chronic systolic (congestive) heart failure: Secondary | ICD-10-CM | POA: Diagnosis not present

## 2021-08-30 LAB — BASIC METABOLIC PANEL WITH GFR
Anion gap: 5 (ref 5–15)
BUN: 16 mg/dL (ref 8–23)
CO2: 25 mmol/L (ref 22–32)
Calcium: 8.8 mg/dL — ABNORMAL LOW (ref 8.9–10.3)
Chloride: 108 mmol/L (ref 98–111)
Creatinine, Ser: 0.93 mg/dL (ref 0.61–1.24)
GFR, Estimated: >60 mL/min
Glucose, Bld: 100 mg/dL — ABNORMAL HIGH (ref 70–99)
Potassium: 4.4 mmol/L (ref 3.5–5.1)
Sodium: 138 mmol/L (ref 135–145)

## 2021-08-30 LAB — APTT
aPTT: 54 seconds — ABNORMAL HIGH (ref 24–36)
aPTT: 47 seconds — ABNORMAL HIGH (ref 24–36)

## 2021-08-30 LAB — GLUCOSE, CAPILLARY
Glucose-Capillary: 107 mg/dL — ABNORMAL HIGH (ref 70–99)
Glucose-Capillary: 81 mg/dL (ref 70–99)
Glucose-Capillary: 97 mg/dL (ref 70–99)

## 2021-08-30 LAB — CBC
HCT: 41.7 % (ref 39.0–52.0)
Hemoglobin: 12.9 g/dL — ABNORMAL LOW (ref 13.0–17.0)
MCH: 29.7 pg (ref 26.0–34.0)
MCHC: 30.9 g/dL (ref 30.0–36.0)
MCV: 96.1 fL (ref 80.0–100.0)
Platelets: 118 K/uL — ABNORMAL LOW (ref 150–400)
RBC: 4.34 MIL/uL (ref 4.22–5.81)
RDW: 17.2 % — ABNORMAL HIGH (ref 11.5–15.5)
WBC: 6.5 K/uL (ref 4.0–10.5)
nRBC: 0 % (ref 0.0–0.2)

## 2021-08-30 LAB — SURGICAL PCR SCREEN
MRSA, PCR: POSITIVE — AB
Staphylococcus aureus: POSITIVE — AB

## 2021-08-30 LAB — HEPARIN LEVEL (UNFRACTIONATED)
Heparin Unfractionated: 0.27 [IU]/mL — ABNORMAL LOW (ref 0.30–0.70)
Heparin Unfractionated: 0.16 IU/mL — ABNORMAL LOW (ref 0.30–0.70)

## 2021-08-30 SURGERY — BIV ICD INSERTION CRT-D

## 2021-08-30 MED ORDER — SODIUM CHLORIDE 0.9 % IV SOLN
80.0000 mg | INTRAVENOUS | Status: DC
  Filled 2021-08-30: qty 2

## 2021-08-30 MED ORDER — SACUBITRIL-VALSARTAN 24-26 MG PO TABS
1.0000 | ORAL_TABLET | Freq: Two times a day (BID) | ORAL | Status: DC
  Administered 2021-08-30 – 2021-09-04 (×11): 1 via ORAL
  Filled 2021-08-30 (×12): qty 1

## 2021-08-30 MED ORDER — SODIUM CHLORIDE 0.9 % IV SOLN
INTRAVENOUS | Status: DC
Start: 2021-08-30 — End: 2021-08-31

## 2021-08-30 MED ORDER — HEPARIN (PORCINE) 25000 UT/250ML-% IV SOLN
1650.0000 [IU]/h | INTRAVENOUS | Status: AC
  Administered 2021-08-30: 1200 [IU]/h via INTRAVENOUS
  Administered 2021-08-31: 1350 [IU]/h via INTRAVENOUS
  Administered 2021-08-31: 1650 [IU]/h via INTRAVENOUS
  Filled 2021-08-30 (×2): qty 250

## 2021-08-30 MED ORDER — VANCOMYCIN HCL 1500 MG/300ML IV SOLN
1500.0000 mg | INTRAVENOUS | Status: DC
  Filled 2021-08-30: qty 300

## 2021-08-30 NOTE — Progress Notes (Addendum)
Electrophysiology Rounding Note  Patient Name: [name] Date of Encounter: 08/30/2021  Primary Cardiologist: Quay Burow, MD Electrophysiologist: New   Subjective   NAEO.     Strong odor of wound noted in room.  Pending WOC eval.   Inpatient Medications    Scheduled Meds:  aspirin  81 mg Oral Daily   atorvastatin  40 mg Oral QHS   Chlorhexidine Gluconate Cloth  6 each Topical Daily   insulin aspart  0-15 Units Subcutaneous TID WC   levofloxacin  750 mg Oral QHS   mupirocin ointment   Nasal BID   sacubitril-valsartan  1 tablet Oral BID   sertraline  50 mg Oral Daily   sodium chloride flush  3 mL Intravenous Q12H   sodium chloride flush  3 mL Intravenous Q12H   sodium chloride flush  3 mL Intravenous Q12H   Continuous Infusions:  sodium chloride     sodium chloride Stopped (08/29/21 1738)   sodium chloride 10 mL/hr at 08/30/21 0600   sodium chloride 50 mL/hr at 08/30/21 0742   heparin 1,200 Units/hr (08/30/21 0810)   PRN Meds: sodium chloride, sodium chloride, acetaminophen, atropine, ondansetron (ZOFRAN) IV, mouth rinse, sodium chloride flush, sodium chloride flush   Vital Signs    Vitals:   08/30/21 0200 08/30/21 0300 08/30/21 0400 08/30/21 0500  BP: (!) 147/84 (!) 154/67    Pulse: (!) 50 (!) 50    Resp: 10 15    Temp:   (!) 97.5 F (36.4 C)   TempSrc:      SpO2: 95% 96%    Weight:    (!) 160.2 kg  Height:        Intake/Output Summary (Last 24 hours) at 08/30/2021 0817 Last data filed at 08/30/2021 0600 Gross per 24 hour  Intake 1800.82 ml  Output 945 ml  Net 855.82 ml   Filed Weights   08/27/21 0600 08/29/21 0500 08/30/21 0500  Weight: (!) 161.9 kg (!) 161.9 kg (!) 160.2 kg    Physical Exam    GEN- The patient is well appearing, alert and oriented x 3 today.   Head- normocephalic, atraumatic Eyes-  Sclera clear, conjunctiva pink Ears- hearing intact Oropharynx- clear Neck- supple Lungs- Clear to ausculation bilaterally, normal work  of breathing Heart- Regular rate and rhythm, no murmurs, rubs or gallops GI- soft, NT, ND, + BS Extremities/Skin- Bilateral LEs with compressive wound dressings Psych- euthymic mood, full affect Neuro- strength and sensation are intact  Labs    CBC Recent Labs    08/29/21 0904 08/30/21 0541  WBC 6.5 6.5  HGB 12.6* 12.9*  HCT 39.8 41.7  MCV 95.2 96.1  PLT 108* 989*   Basic Metabolic Panel Recent Labs    08/29/21 0904 08/30/21 0541  NA 137 138  K 4.4 4.4  CL 107 108  CO2 23 25  GLUCOSE 110* 100*  BUN 18 16  CREATININE 0.94 0.93  CALCIUM 8.8* 8.8*   Liver Function Tests No results for input(s): "AST", "ALT", "ALKPHOS", "BILITOT", "PROT", "ALBUMIN" in the last 72 hours. No results for input(s): "LIPASE", "AMYLASE" in the last 72 hours. Cardiac Enzymes No results for input(s): "CKTOTAL", "CKMB", "CKMBINDEX", "TROPONINI" in the last 72 hours.   Telemetry    V pacing at 50 (personally reviewed)  Radiology    No results found.  Patient Profile     [name] is a 66 y.o. male with a hx of HFREF, perm atrial fib, obesity, HTN, DM and sleep  apnea who is being seen for the evaluation of complete heart block   Assessment & Plan    Complete Heart block 2. IVCD The patient has not had recurrence of his AV conduction.  He will ultimately require PPM insertion.  Pt has a very broad QRS at 170-195 ms and given his severe LV dysfunction, CRT would be preferable, but at this time he has bilateral severe LE wounds.   TVP threshold is 0.6V.   It is possible he will require a temp perm vs leadless pacer to temporize him to the point of being appropriate for permanent transvenous pacing.   3. LE wounds Most recent picture in chart 06/2021.  Strong smell of decay in room today. WOC to assess.     4. Acute on chronic systolic CHF HF team following, adjusting GDMT as tolerated.   Dr. Quentin Ore to see. Plan will hinge somewhat on WOC assessment and recommendations.  At  this time a permanent transvenous device has a significantly elevated risk of infection.   For questions or updates, please contact [place] Please consult [email or web address] for contact info under Cardiology/STEMI.  Signed, Shirley Friar, PA-C  08/30/2021, 8:17 AM

## 2021-08-30 NOTE — Progress Notes (Addendum)
Advanced Heart Failure Rounding Note   Subjective:    Remains paced via TVP. Denies CP or SOB. SBP in 150s   Room with strong odor from his LE wound    Objective:   Weight Range:  Vital Signs:   Temp:  [96.9 F (36.1 C)-98.2 F (36.8 C)] 97 F (36.1 C) (08/21 0000) Pulse Rate:  [49-54] 50 (08/21 0300) Resp:  [5-28] 15 (08/21 0300) BP: (130-157)/(47-94) 154/67 (08/21 0300) SpO2:  [95 %-98 %] 96 % (08/21 0300) Last BM Date : 08/29/21  Weight change: Filed Weights   08/27/21 0600 08/29/21 0500  Weight: (!) 161.9 kg (!) 161.9 kg    Intake/Output:   Intake/Output Summary (Last 24 hours) at 08/30/2021 0629 Last data filed at 08/29/2021 1800 Gross per 24 hour  Intake 1103.61 ml  Output 550 ml  Net 553.61 ml     Physical Exam: General:  Lying in bed . No resp difficulty HEENT: normal Neck: supple. RIJ TVP Carotids 2+ bilat; no bruits. No lymphadenopathy or thryomegaly appreciated. Cor: PMI nondisplaced. Regular rate & rhythm. No rubs, gallops or murmurs. Lungs: clear Abdomen: obese soft, nontender, nondistended. No hepatosplenomegaly. No bruits or masses. Good bowel sounds. Extremities: no cyanosis, clubbing, rash, edema  + LE wraps with strong wound odor Neuro: alert & orientedx3, cranial nerves grossly intact. moves all 4 extremities w/o difficulty. Affect pleasant  Telemetry: VP pacing 50 with underlying AF and heart block Personally reviewed  Labs: Basic Metabolic Panel: Recent Labs  Lab 08/24/21 1546 08/24/21 1546 08/26/21 1232 08/26/21 1253 08/27/21 5701 08/27/21 1521 08/27/21 1528 08/28/21 0439 08/29/21 0904  NA 141   < > 136 137 135 147* 140  139 137 137  K 4.6  --  4.8 5.0 4.8 3.7 4.7  4.9 5.6* 4.4  CL 102  --  104 105 102  --   --  105 107  CO2 24  --   --  22 21*  --   --  25 23  GLUCOSE 82  --  104* 109* 90  --   --  104* 110*  BUN 19  --  21 20 25*  --   --  25* 18  CREATININE 1.22  --  1.50* 1.51* 1.68*  --   --  1.48* 0.94  CALCIUM  9.2  --   --  8.9 9.0  --   --  9.0 8.8*  MG  --   --   --  1.8  --   --   --   --   --   PHOS  --   --   --  4.1  --   --   --   --   --    < > = values in this interval not displayed.    Liver Function Tests: Recent Labs  Lab 08/26/21 1253  AST 21  ALT 10  ALKPHOS 120  BILITOT 1.8*  PROT 6.8  ALBUMIN 3.3*   No results for input(s): "LIPASE", "AMYLASE" in the last 168 hours. No results for input(s): "AMMONIA" in the last 168 hours.  CBC: Recent Labs  Lab 08/26/21 1232 08/26/21 1253 08/27/21 1521 08/27/21 1528 08/29/21 0904  WBC  --  6.9  --   --  6.5  NEUTROABS  --  5.1  --   --   --   HGB 13.9 13.0 11.2* 12.9*  13.3 12.6*  HCT 41.0 41.3 33.0* 38.0*  39.0 39.8  MCV  --  95.4  --   --  95.2  PLT  --  129*  --   --  108*    Cardiac Enzymes: No results for input(s): "CKTOTAL", "CKMB", "CKMBINDEX", "TROPONINI" in the last 168 hours.  BNP: BNP (last 3 results) Recent Labs    08/02/21 1150 08/26/21 1259  BNP 475.7* 655.7*    ProBNP (last 3 results) No results for input(s): "PROBNP" in the last 8760 hours.    Other results:  Imaging: No results found.   Medications:     Scheduled Medications:  aspirin  81 mg Oral Daily   atorvastatin  40 mg Oral QHS   Chlorhexidine Gluconate Cloth  6 each Topical Daily   gentamicin (GARAMYCIN) 80 mg in sodium chloride 0.9 % 500 mL irrigation  80 mg Irrigation On Call   insulin aspart  0-15 Units Subcutaneous TID WC   levofloxacin  750 mg Oral QHS   mupirocin ointment   Nasal BID   sertraline  50 mg Oral Daily   sodium chloride flush  3 mL Intravenous Q12H   sodium chloride flush  3 mL Intravenous Q12H   sodium chloride flush  3 mL Intravenous Q12H    Infusions:  sodium chloride     sodium chloride Stopped (08/29/21 1738)   sodium chloride 10 mL/hr at 08/29/21 1800   sodium chloride     heparin 1,400 Units/hr (08/30/21 0517)   vancomycin      PRN Medications: sodium chloride, sodium chloride,  acetaminophen, atropine, ondansetron (ZOFRAN) IV, mouth rinse, sodium chloride flush, sodium chloride flush   Assessment/Plan   Acute on Chronic Systolic Biventricular Heart Failure - Echo 02/2017 EF 25-30% w/ diffuse HK. RV normal. Felt to be viral CM +/- component of tachymediated in setting of influenza PNA and rapid Afib  - Echo 07/2017 EF recovered, 50-55%, RV mildly reduced - Echo 11/2018 EF 45-50%, RV mildly reduced (in rate controlled afib)  - Echo 08/23/21 EF 30-35%, severe LVH, moderately reduced RV systolic function, moderate TVR - NYHA Class III, confounded by obesity  - R/L cath 08/27/21. Diffuse non-obstructive CAD with 90% lesion in dLAD - RHC cath with biventricular failure R>L: RA 21 PA 56/22 (36) PCW 24 Fick 7.3/2.7 PaPi 2.1 - given concomitant conduction disease, profound bradycardia and LVH, consider Amyloid w/u - unable to get cMRI with TVP  - GDMT has been limited by AKI and hypotension  - Add GDMT as tolerated. Start low-dose Entresto today   2. Profound Bradycardia w/ High-grade AV block - s/p placement of TVP  - off coreg - has known RBBB  - Plan for CRT-D today but I am a bit concerned about timing with ongoing LE wound. Will have Wound Care see today.   3. Chronic Afib - dates back to at least 2019, has been historically well rate controlled - eliquis PTA, holding for planned cath  - has never had attempt at [place]. Given chronicity , tx w/ rhythm control at this point may be difficult  - size may prohibit ablation  - continue tx of OSA w/ CPAP    4. AKI due to ATN - b/w Scr 0.9 - 1.51 on admit, >>1.68 -> 1.48 -> 0.94 (? Accurate) - suspect 2/2 hypotension/ bradycardia - follow BMP    5. PAD/ Chronic Venous Stasis Wounds  - s/p left transmetatarsal amputation - followed by [facility] and Wound clinic  - room with strong odor of active wound. Will ask wound care to see.  - on levofloxacin  750 mg qd    6. H/o Lymphoma - s/p R-CHOP in 2011 (potential cardiotoxic  effects)    7. Type 2DM - Hgb A1c 6.0   8. CAD - diffuse mostly non-obstructive CAD on cath. Not cause of CM - medical management with ASA/statin  - If has angina can consider PCI PDA   9. Morbid obesity - needs weight loss - consider GLP1RA  10. Hypertension - Renal function improving. Start low-dose Delene Loll  - repeat BMET this am  Length of Stay: 4   Glori Bickers MD 08/30/2021, 6:29 AM  Advanced Heart Failure Team Pager [phone] (M-F; [place])  Please contact [facility] for night-coverage after hours (4p -7a ) and weekends on [email or web address]

## 2021-08-30 NOTE — Progress Notes (Signed)
Orthopedic Tech Progress Note Patient Details:  [name] [birth year] [id number]  Ortho Devices Type of Ortho Device: Haematologist Ortho Device/Splint Location: LLE and RLE Ortho Device/Splint Interventions: Ordered, Application, Adjustment   Post Interventions Patient Tolerated: Well Instructions Provided: Care of device  Arville Go 08/30/2021, 6:55 PM

## 2021-08-30 NOTE — Consult Note (Signed)
WOC Nurse Consult Note: Reason for Consult:chronic nonhealing wounds to lower extremities.  Venous stasis ulcers in the setting of lymphedema.  Seen at wound care center with Unna boot +kerlix layer in place.  Was anticipating starting an antibiotic topical compound that has not arrived in the mail yet. Is awaiting pacemaker placement.   History left transmetatarsal amputation, healed.  NOnintact lesion to right second and third metatarsal. Wound type: venous insufficiency Pressure Injury POA:NA Measurement: Right medial lower leg:  7 cm x 8 cm x 1.0 cm  Right lateral leg:  2 cm x 4.2 cm x 0.3 cm  Left medial leg: 3 cm x 1.2 cm x 0.2 cm Left anterior lower leg: 3.5 cm x 6 cm x 0.2 cm  Wound bed: Pale pink, nongraulating.  No devitalized tissue present Drainage (amount, consistency, odor) Moderate serosanguinous  Musty odor, pungent Periwound: chronic skin changes, edema Dressing procedure/placement/frequency: Bedside RN to perform the following prior to ortho tech wrapping compression. Cleanse wounds to bilateral lower legs and right toes with soap and water and pat dry. Moisturize legs with emollient cream.  Apply Aquacel Ag to open wounds and wrap legs with kerilx from below toes to below knees.  Cover toe wounds with dry dressing and tape.   Ortho tech to wrap with [employer] twice weekly.  Will not follow at this time.  Please re-consult if needed.  Domenic Moras MSN, RN, FNP-BC CWON Wound, Ostomy, Continence Nurse Pager [phone]

## 2021-08-30 NOTE — Progress Notes (Signed)
ANTICOAGULATION CONSULT NOTE - Follow Up Consult  Pharmacy Consult for IV Heparin Indication: atrial fibrillation  Allergies  Allergen Reactions   Latex Hives and Swelling   Ace Inhibitors     Other reaction(s): cough   Penicillin G     Other reaction(s): rash Has tolerated amoxicillin    Patient Measurements: Height: '5\' 11"'$  (180.3 cm) Weight: (!) 160.2 kg (353 lb 2.8 oz) IBW/kg (Calculated) : 75.3 Heparin Dosing Weight: 111.6 kg  Vital Signs: Temp: 97.5 F (36.4 C) (08/21 0400) BP: 154/67 (08/21 0300) Pulse Rate: 50 (08/21 0300)  Labs: Recent Labs    08/27/21 1528 08/27/21 2029 08/27/21 2029 08/28/21 0439 08/28/21 0646 08/29/21 0904 08/30/21 0541  HGB 12.9*  13.3  --   --   --   --  12.6* 12.9*  HCT 38.0*  39.0  --   --   --   --  39.8 41.7  PLT  --   --   --   --   --  108* 118*  APTT  --  37*   < > 136* 51* 101* 54*  HEPARINUNFRC  --  >1.10*  --   --   --   --  0.27*  CREATININE  --   --   --  1.48*  --  0.94 0.93   < > = values in this interval not displayed.     Estimated Creatinine Clearance: 120.8 mL/min (by C-G formula based on SCr of 0.93 mg/dL).   Medical History: Past Medical History:  Diagnosis Date   Acute systolic HF (heart failure) (HCC)    Arthritis    lt foot, hips knees    Atrial fibrillation (HCC)    Diabetes mellitus, new onset ([place])    Diverticulosis    Dyslipidemia    Gout attack 01/16/2012   Hypertension    Internal hemorrhoids    Lymphoma ([place])    remission for about 2 years, chemo 3 years prior   Pneumonia due to COVID-19 virus 07/2018   Pulmonary hypertension ([place])    Sepsis ([place]) 02/2017   secondary to influenza; requiring trach   Tubular adenoma of colon    Venous stasis ulcers ([place])     Assessment: 66 years of age male on Apixaban prior to admission for atrial fibrillation. Last dose of Apixaban was on 8/17 AM. Pharmacy consulted post cath to start IV Heparin 2 hr after TR band removal.   Heparin level at upper  end of therapeutic range yesterday and drip rate decreased - then turned off for procedure - which is now canceled today - restart heparin - resume a t lower rate and reassess with labs this afternoon CBC stable, no overt bleeding or complications noted.  Follow up planning PPM and restart DOAC  Goal of Therapy:  Heparin level 0.3-0.7 units/ml aPTT 66-102 seconds Monitor platelets by anticoagulation protocol: Yes   Plan:  Restart  IV heparin 1200 units/hr  Draw heparin level and aptt 6hr after restart  Daily heparin level, aPTT and CBC.     Bonnita Nasuti Pharm.D. CPP, BCPS Clinical Pharmacist [phone] 08/30/2021 8:15 AM   [facility] pharmacy phone numbers are listed on [email or web address]

## 2021-08-30 NOTE — Progress Notes (Signed)
ANTICOAGULATION CONSULT NOTE - Follow Up Consult  Pharmacy Consult for IV Heparin Indication: atrial fibrillation  Allergies  Allergen Reactions   Latex Hives and Swelling   Ace Inhibitors     Other reaction(s): cough   Penicillin G     Other reaction(s): rash Has tolerated amoxicillin    Patient Measurements: Height: '5\' 11"'$  (180.3 cm) Weight: (!) 160.2 kg (353 lb 2.8 oz) IBW/kg (Calculated) : 75.3 Heparin Dosing Weight: 111.6 kg  Vital Signs: Temp: 98.7 F (37.1 C) (08/21 1613) Temp Source: Oral (08/21 1613) BP: 91/81 (08/21 1200) Pulse Rate: 55 (08/21 1300)  Labs: Recent Labs    08/27/21 2029 08/28/21 0439 08/28/21 0646 08/29/21 0904 08/30/21 0541 08/30/21 1402  HGB  --   --   --  12.6* 12.9*  --   HCT  --   --   --  39.8 41.7  --   PLT  --   --   --  108* 118*  --   APTT 37* 136*   < > 101* 54* 47*  HEPARINUNFRC >1.10*  --   --   --  0.27* 0.16*  CREATININE  --  1.48*  --  0.94 0.93  --    < > = values in this interval not displayed.     Estimated Creatinine Clearance: 120.8 mL/min (by C-G formula based on SCr of 0.93 mg/dL).   Medical History: Past Medical History:  Diagnosis Date   Acute systolic HF (heart failure) (HCC)    Arthritis    lt foot, hips knees    Atrial fibrillation (HCC)    Diabetes mellitus, new onset ([place])    Diverticulosis    Dyslipidemia    Gout attack 01/16/2012   Hypertension    Internal hemorrhoids    Lymphoma ([place])    remission for about 2 years, chemo 3 years prior   Pneumonia due to COVID-19 virus 07/2018   Pulmonary hypertension ([place])    Sepsis ([place]) 02/2017   secondary to influenza; requiring trach   Tubular adenoma of colon    Venous stasis ulcers ([place])     Assessment: 66 years of age male on Apixaban prior to admission for atrial fibrillation. Last dose of Apixaban was on 8/17 AM. Pharmacy consulted post cath to start IV Heparin 2 hr after TR band removal.   Heparin level 0.16 on heparin drip 1200 uts/hr   CBC stable, no overt bleeding or complications noted.  Follow up planning PPM and restart DOAC  Goal of Therapy:  Heparin level 0.3-0.7 units/ml aPTT 66-102 seconds Monitor platelets by anticoagulation protocol: Yes   Plan:  Increase IV heparin 1350 units/hr  Daily heparin level, aPTT and CBC.     Bonnita Nasuti Pharm.D. CPP, BCPS Clinical Pharmacist [phone] 08/30/2021 4:24 PM   [facility] pharmacy phone numbers are listed on [email or web address]

## 2021-08-31 DIAGNOSIS — I5042 Chronic combined systolic (congestive) and diastolic (congestive) heart failure: Secondary | ICD-10-CM | POA: Diagnosis not present

## 2021-08-31 DIAGNOSIS — I5023 Acute on chronic systolic (congestive) heart failure: Secondary | ICD-10-CM | POA: Diagnosis not present

## 2021-08-31 DIAGNOSIS — I5082 Biventricular heart failure: Secondary | ICD-10-CM

## 2021-08-31 LAB — CBC
HCT: 39.2 % (ref 39.0–52.0)
Hemoglobin: 12.7 g/dL — ABNORMAL LOW (ref 13.0–17.0)
MCH: 30.5 pg (ref 26.0–34.0)
MCHC: 32.4 g/dL (ref 30.0–36.0)
MCV: 94.2 fL (ref 80.0–100.0)
Platelets: 115 10*3/uL — ABNORMAL LOW (ref 150–400)
RBC: 4.16 MIL/uL — ABNORMAL LOW (ref 4.22–5.81)
RDW: 17.2 % — ABNORMAL HIGH (ref 11.5–15.5)
WBC: 6.3 10*3/uL (ref 4.0–10.5)
nRBC: 0 % (ref 0.0–0.2)

## 2021-08-31 LAB — GLUCOSE, CAPILLARY
Glucose-Capillary: 85 mg/dL (ref 70–99)
Glucose-Capillary: 87 mg/dL (ref 70–99)
Glucose-Capillary: 92 mg/dL (ref 70–99)
Glucose-Capillary: 94 mg/dL (ref 70–99)
Glucose-Capillary: 99 mg/dL (ref 70–99)

## 2021-08-31 LAB — LIPOPROTEIN A (LPA): Lipoprotein (a): 11.9 nmol/L (ref <75.0)

## 2021-08-31 LAB — BASIC METABOLIC PANEL
Anion gap: 6 (ref 5–15)
BUN: 14 mg/dL (ref 8–23)
CO2: 23 mmol/L (ref 22–32)
Calcium: 8.7 mg/dL — ABNORMAL LOW (ref 8.9–10.3)
Chloride: 108 mmol/L (ref 98–111)
Creatinine, Ser: 0.76 mg/dL (ref 0.61–1.24)
GFR, Estimated: >60 mL/min (ref >60)
Glucose, Bld: 100 mg/dL — ABNORMAL HIGH (ref 70–99)
Potassium: 4.4 mmol/L (ref 3.5–5.1)
Sodium: 137 mmol/L (ref 135–145)

## 2021-08-31 LAB — HEPARIN LEVEL (UNFRACTIONATED)
Heparin Unfractionated: 0.13 IU/mL — ABNORMAL LOW (ref 0.30–0.70)
Heparin Unfractionated: 0.17 IU/mL — ABNORMAL LOW (ref 0.30–0.70)

## 2021-08-31 LAB — APTT: aPTT: 49 seconds — ABNORMAL HIGH (ref 24–36)

## 2021-08-31 MED ORDER — SODIUM CHLORIDE 0.9 % IV SOLN: INTRAVENOUS | Status: DC

## 2021-08-31 MED ORDER — TORSEMIDE 20 MG PO TABS: 20.0000 mg | ORAL_TABLET | Freq: Every day | ORAL | Status: DC

## 2021-08-31 MED ORDER — TORSEMIDE 20 MG PO TABS
40.0000 mg | ORAL_TABLET | Freq: Once | ORAL | Status: AC
  Administered 2021-08-31: 40 mg via ORAL
  Filled 2021-08-31: qty 2

## 2021-08-31 MED ORDER — SPIRONOLACTONE 12.5 MG HALF TABLET
12.5000 mg | ORAL_TABLET | Freq: Every day | ORAL | Status: DC
  Administered 2021-08-31 – 2021-09-03 (×4): 12.5 mg via ORAL
  Filled 2021-08-31 (×4): qty 1

## 2021-08-31 NOTE — Progress Notes (Signed)
Date and time results received: 08/31/21 0748   Test: EKG Critical Value: Acute MI  Name of Provider Notified: Oda Kilts PA  Orders Received? Or Actions Taken?: None

## 2021-08-31 NOTE — Progress Notes (Signed)
ANTICOAGULATION CONSULT NOTE - Follow Up Consult  Pharmacy Consult for IV Heparin Indication: atrial fibrillation  Allergies  Allergen Reactions   Latex Hives and Swelling   Ace Inhibitors     Other reaction(s): cough   Penicillin G     Other reaction(s): rash Has tolerated amoxicillin    Patient Measurements: Height: '5\' 11"'$  (180.3 cm) Weight: (!) 160.2 kg (353 lb 2.8 oz) IBW/kg (Calculated) : 75.3 Heparin Dosing Weight: 111.6 kg  Vital Signs: Temp: 97.9 F (36.6 C) (08/22 1207) Temp Source: Oral (08/22 1207) BP: 125/93 (08/22 1207) Pulse Rate: 67 (08/22 1207)  Labs: Recent Labs    08/29/21 0904 08/29/21 0904 08/30/21 0541 08/30/21 1402 08/31/21 0100 08/31/21 1044  HGB 12.6*  --  12.9*  --  12.7*  --   HCT 39.8  --  41.7  --  39.2  --   PLT 108*  --  118*  --  115*  --   APTT 101*  --  54* 47* 49*  --   HEPARINUNFRC  --    < > 0.27* 0.16* 0.13* 0.17*  CREATININE 0.94  --  0.93  --  0.76  --    < > = values in this interval not displayed.     Estimated Creatinine Clearance: 140.4 mL/min (by C-G formula based on SCr of 0.76 mg/dL).   Assessment: 66 years of age male on Apixaban prior to admission for atrial fibrillation. Last dose of Apixaban was on 8/17 AM. Pharmacy consulted post cath to start IV Heparin 2 hr after TR band removal.   Heparin level remains subtherapeutic (0.17) on infusion at 1550 units/hr. No issues with line heparin running in PIV and lab draw  No bleeding  Goal of Therapy:  Heparin level 0.3-0.7 units/ml aPTT 66-102 seconds Monitor platelets by anticoagulation protocol: Yes   Plan:  Increase IV heparin to 1650 units/hr  Daily heparin level and cbc   Bonnita Nasuti Pharm.D. CPP, BCPS Clinical Pharmacist [phone] 08/31/2021 2:13 PM   Please see [email or web address] for complete clinical pharmacist phone list 08/31/2021 2:10 PM

## 2021-08-31 NOTE — Progress Notes (Signed)
ANTICOAGULATION CONSULT NOTE - Follow Up Consult  Pharmacy Consult for IV Heparin Indication: atrial fibrillation  Allergies  Allergen Reactions   Latex Hives and Swelling   Ace Inhibitors     Other reaction(s): cough   Penicillin G     Other reaction(s): rash Has tolerated amoxicillin    Patient Measurements: Height: '5\' 11"'$  (180.3 cm) Weight: (!) 160.2 kg (353 lb 2.8 oz) IBW/kg (Calculated) : 75.3 Heparin Dosing Weight: 111.6 kg  Vital Signs: Temp: 98.1 F (36.7 C) (08/21 2000) Temp Source: Oral (08/21 2000) BP: 135/72 (08/21 2300) Pulse Rate: 66 (08/21 2300)  Labs: Recent Labs    08/28/21 0439 08/28/21 0646 08/29/21 0904 08/30/21 0541 08/30/21 1402 08/31/21 0100  HGB  --    < > 12.6* 12.9*  --  12.7*  HCT  --   --  39.8 41.7  --  39.2  PLT  --   --  108* 118*  --  115*  APTT 136*   < > 101* 54* 47* 49*  HEPARINUNFRC  --   --   --  0.27* 0.16* 0.13*  CREATININE 1.48*  --  0.94 0.93  --   --    < > = values in this interval not displayed.     Estimated Creatinine Clearance: 120.8 mL/min (by C-G formula based on SCr of 0.93 mg/dL).   Assessment: 66 years of age male on Apixaban prior to admission for atrial fibrillation. Last dose of Apixaban was on 8/17 AM. Pharmacy consulted post cath to start IV Heparin 2 hr after TR band removal.   Heparin level remains subtherapeutic (0.13) on infusion at 1350 units/hr. No issues with line or bleeding reported per RN.   Goal of Therapy:  Heparin level 0.3-0.7 units/ml aPTT 66-102 seconds Monitor platelets by anticoagulation protocol: Yes   Plan:  Increase IV heparin to 1550 units/hr  F/u heparin level in 6 hours D/c aPTT  Sherlon Handing, PharmD, BCPS Please see [email or web address] for complete clinical pharmacist phone list 08/31/2021 2:21 AM

## 2021-08-31 NOTE — Progress Notes (Signed)
Notified Bensimhon MD and Tillery PA of patient's continued frequent 4-6 second pauses with TVP unplugged (per order). Order received to plug TVP back in. Patient did develop asystole long enough to lose consciousness briefly. TVP plugged back in and functioning properly and patient alert and oriented. Vitals otherwise stable.

## 2021-08-31 NOTE — Progress Notes (Addendum)
Electrophysiology Rounding Note  Patient Name: [name] Date of Encounter: 08/31/2021  Primary Cardiologist: Quay Burow, MD Electrophysiologist: New   Subjective   NAEO.   No complaint currently at rest.   Inpatient Medications    Scheduled Meds:  aspirin  81 mg Oral Daily   atorvastatin  40 mg Oral QHS   Chlorhexidine Gluconate Cloth  6 each Topical Daily   insulin aspart  0-15 Units Subcutaneous TID WC   levofloxacin  750 mg Oral QHS   mupirocin ointment   Nasal BID   sacubitril-valsartan  1 tablet Oral BID   sertraline  50 mg Oral Daily   sodium chloride flush  3 mL Intravenous Q12H   sodium chloride flush  3 mL Intravenous Q12H   sodium chloride flush  3 mL Intravenous Q12H   Continuous Infusions:  sodium chloride     sodium chloride Stopped (08/29/21 1738)   sodium chloride 50 mL/hr at 08/30/21 0619   sodium chloride 10 mL/hr at 08/31/21 0200   heparin 1,350 Units/hr (08/31/21 0200)   PRN Meds: sodium chloride, sodium chloride, acetaminophen, atropine, ondansetron (ZOFRAN) IV, mouth rinse, sodium chloride flush, sodium chloride flush   Vital Signs    Vitals:   08/31/21 0300 08/31/21 0400 08/31/21 0500 08/31/21 0600  BP: (!) 140/74 115/71 (!) 136/120 135/87  Pulse: 78 67 84 82  Resp: 12 14 (!) 27 (!) 9  Temp:  97.6 F (36.4 C)    TempSrc:  Oral    SpO2: (!) 88% 95% 93% 90%  Weight:      Height:        Intake/Output Summary (Last 24 hours) at 08/31/2021 0724 Last data filed at 08/31/2021 0300 Gross per 24 hour  Intake 1416.75 ml  Output 1565 ml  Net -148.25 ml   Filed Weights   08/27/21 0600 08/29/21 0500 08/30/21 0500  Weight: (!) 161.9 kg (!) 161.9 kg (!) 160.2 kg    Physical Exam    GEN- The patient is well appearing, alert and oriented x 3 today.   Head- normocephalic, atraumatic Eyes-  Sclera clear, conjunctiva pink Ears- hearing intact Oropharynx- clear Neck- supple Lungs- Clear to ausculation bilaterally, normal work of  breathing Heart- Irregularly irregular rate and rhythm, no murmurs, rubs or gallops GI- morbidly obese, soft, NT, ND, + BS Extremities- Dressings in place. Bilateral chronic type lymphedema.  Skin- no rash or lesion Psych- flat but appropriate affect Neuro- strength and sensation are intact  Labs    CBC Recent Labs    08/30/21 0541 08/31/21 0100  WBC 6.5 6.3  HGB 12.9* 12.7*  HCT 41.7 39.2  MCV 96.1 94.2  PLT 118* 203*   Basic Metabolic Panel Recent Labs    08/30/21 0541 08/31/21 0100  NA 138 137  K 4.4 4.4  CL 108 108  CO2 25 23  GLUCOSE 100* 100*  BUN 16 14  CREATININE 0.93 0.76  CALCIUM 8.8* 8.7*   Liver Function Tests No results for input(s): "AST", "ALT", "ALKPHOS", "BILITOT", "PROT", "ALBUMIN" in the last 72 hours. No results for input(s): "LIPASE", "AMYLASE" in the last 72 hours. Cardiac Enzymes No results for input(s): "CKTOTAL", "CKMB", "CKMBINDEX", "TROPONINI" in the last 72 hours.   Telemetry    AF underlying, wide QRS rhythm in 60-70s (personally reviewed)  Radiology    No results found.  Patient Profile     [name] is a 66 y.o. male with a hx of HFREF, perm atrial fib, obesity, HTN,  DM and sleep apnea who is being seen for the evaluation of complete heart block   Assessment & Plan    Complete Heart block 2. IVCD The pt is currently conducting, will get EKG to confirm.   Pt has a very broad QRS at 170-195 ms and given his severe LV dysfunction he would still benefit from CRT, but some of the urgency will be removed if conduction remains stable.  EP MDs meeting this am to discuss best course.    3. LE wounds Most recent picture in chart 06/2021.  WOC assessment is this will likely be a continued and chronic issue. Unlikely to have significant improvement anytime soon given chronic lymphedema.    4. Acute on chronic systolic CHF HF team following, adjusting GDMT as tolerated.   MD to see. Plan for type and timing of pacing pending MD  discussion.    For questions or updates, please contact [place] Please consult [email or web address] for contact info under Cardiology/STEMI.  Signed, Shirley Friar, PA-C  08/31/2021, 7:24 AM

## 2021-08-31 NOTE — Progress Notes (Addendum)
Advanced Heart Failure Rounding Note   Subjective:    Remains pacer dependent. Trialed off TVP support, c/w frequent pauses, longest 14 sec w/ brief LOC. TVP turned back on.    No dyspnea or CP.   BP tolerating Entresto start. SCr and K normal.    Objective:   Weight Range:  Vital Signs:   Temp:  [97.6 F (36.4 C)-98.9 F (37.2 C)] 97.6 F (36.4 C) (08/22 0400) Pulse Rate:  [55-84] 79 (08/22 0700) Resp:  [6-27] 19 (08/22 0700) BP: (91-158)/(53-120) 122/73 (08/22 0700) SpO2:  [88 %-97 %] 94 % (08/22 0700) Weight:  [160.2 kg] 160.2 kg (08/22 0500) Last BM Date : 08/29/21  Weight change: Filed Weights   08/29/21 0500 08/30/21 0500 08/31/21 0500  Weight: (!) 161.9 kg (!) 160.2 kg (!) 160.2 kg    Intake/Output:   Intake/Output Summary (Last 24 hours) at 08/31/2021 0809 Last data filed at 08/31/2021 0600 Gross per 24 hour  Intake 1466.31 ml  Output 2040 ml  Net -573.69 ml     PHYSICAL EXAM: General:  Well appearing, obese. No respiratory difficulty HEENT: normal Neck: supple. Thick neck, JVD not well visualized Carotids 2+ bilat; no bruits. No lymphadenopathy or thyromegaly appreciated. Cor: PMI nondisplaced. Irregular rhythm and rate. No rubs, gallops or murmurs. Lungs: clear Abdomen: soft, nontender, nondistended. No hepatosplenomegaly. No bruits or masses. Good bowel sounds. Extremities: no cyanosis, clubbing, rash, chronic venous stasis ulcers b/l booth legs wrapped.  Neuro: alert & oriented x 3, cranial nerves grossly intact. moves all 4 extremities w/o difficulty. Affect pleasant.   Telemetry:  AF 50s-60s, wide QRS  Personally reviewed  Labs: Basic Metabolic Panel: Recent Labs  Lab 08/26/21 1253 08/27/21 0633 08/27/21 1521 08/27/21 1528 08/28/21 0439 08/29/21 0904 08/30/21 0541 08/31/21 0100  NA 137 135   < > 140  139 137 137 138 137  K 5.0 4.8   < > 4.7  4.9 5.6* 4.4 4.4 4.4  CL 105 102  --   --  105 107 108 108  CO2 22 21*  --   --  '25 23  25 23  '$ GLUCOSE 109* 90  --   --  104* 110* 100* 100*  BUN 20 25*  --   --  25* '18 16 14  '$ CREATININE 1.51* 1.68*  --   --  1.48* 0.94 0.93 0.76  CALCIUM 8.9 9.0  --   --  9.0 8.8* 8.8* 8.7*  MG 1.8  --   --   --   --   --   --   --   PHOS 4.1  --   --   --   --   --   --   --    < > = values in this interval not displayed.    Liver Function Tests: Recent Labs  Lab 08/26/21 1253  AST 21  ALT 10  ALKPHOS 120  BILITOT 1.8*  PROT 6.8  ALBUMIN 3.3*   No results for input(s): "LIPASE", "AMYLASE" in the last 168 hours. No results for input(s): "AMMONIA" in the last 168 hours.  CBC: Recent Labs  Lab 08/26/21 1253 08/27/21 1521 08/27/21 1528 08/29/21 0904 08/30/21 0541 08/31/21 0100  WBC 6.9  --   --  6.5 6.5 6.3  NEUTROABS 5.1  --   --   --   --   --   HGB 13.0 11.2* 12.9*  13.3 12.6* 12.9* 12.7*  HCT 41.3 33.0* 38.0*  39.0 39.8  41.7 39.2  MCV 95.4  --   --  95.2 96.1 94.2  PLT 129*  --   --  108* 118* 115*    Cardiac Enzymes: No results for input(s): "CKTOTAL", "CKMB", "CKMBINDEX", "TROPONINI" in the last 168 hours.  BNP: BNP (last 3 results) Recent Labs    08/02/21 1150 08/26/21 1259  BNP 475.7* 655.7*    ProBNP (last 3 results) No results for input(s): "PROBNP" in the last 8760 hours.    Other results:  Imaging: No results found.   Medications:     Scheduled Medications:  aspirin  81 mg Oral Daily   atorvastatin  40 mg Oral QHS   Chlorhexidine Gluconate Cloth  6 each Topical Daily   insulin aspart  0-15 Units Subcutaneous TID WC   levofloxacin  750 mg Oral QHS   mupirocin ointment   Nasal BID   sacubitril-valsartan  1 tablet Oral BID   sertraline  50 mg Oral Daily   sodium chloride flush  3 mL Intravenous Q12H   sodium chloride flush  3 mL Intravenous Q12H   sodium chloride flush  3 mL Intravenous Q12H    Infusions:  sodium chloride     sodium chloride Stopped (08/29/21 1738)   sodium chloride 50 mL/hr at 08/30/21 0619   sodium chloride  10 mL/hr at 08/31/21 0600   heparin 1,550 Units/hr (08/31/21 0600)    PRN Medications: sodium chloride, sodium chloride, acetaminophen, atropine, ondansetron (ZOFRAN) IV, mouth rinse, sodium chloride flush, sodium chloride flush   Assessment/Plan   Acute on Chronic Systolic Biventricular Heart Failure - Echo 02/2017 EF 25-30% w/ diffuse HK. RV normal. Felt to be viral CM +/- component of tachymediated in setting of influenza PNA and rapid Afib  - Echo 07/2017 EF recovered, 50-55%, RV mildly reduced - Echo 11/2018 EF 45-50%, RV mildly reduced (in rate controlled afib)  - Echo 08/23/21 EF 30-35%, severe LVH, moderately reduced RV systolic function, moderate TVR - NYHA Class III, confounded by obesity  - R/L cath 08/27/21. Diffuse non-obstructive CAD with 90% lesion in dLAD - RHC cath with biventricular failure R>L: RA 21 PA 56/22 (36) PCW 24 Fick 7.3/2.7 PaPi 2.1 - given concomitant conduction disease, profound bradycardia and LVH, consider Amyloid w/u - unable to get cMRI with TVP  - GDMT initially limited by AKI and hypotension, now resolved   - continue GDMT titration as tolerated.  - Continue Entresto 24-26 mg bid  - Start Spiro 12.5 mg daily  - SGLT2i next  - no ? blocker/digoxin w/ bradycardia     2. Profound Bradycardia w/ High-grade AV block - s/p placement of TVP  - off coreg - has known RBBB  - remains pacer dependent w/ TVP. EP planning BiV PPM implant tomorrow   3. Chronic Afib - dates back to at least 2019, has been historically well rate controlled - eliquis PTA, holding for possible device implant. Continue heparin gtt  - has never had attempt at DCCV. Given chronicity , tx w/ rhythm control at this point may be difficult  - size may prohibit ablation  - continue tx of OSA w/ CPAP    4. AKI due to ATN - b/w Scr 0.9 - 1.51 on admit, >>1.68 -> 1.48 -> 0.94 ->0.76  - suspect 2/2 hypotension/ bradycardia - follow BMP    5. PAD/ Chronic Venous Stasis Wounds  - s/p  left transmetatarsal amputation - followed by [facility] and Wound clinic  - Seen by [place] RN. Wounds felt  to be chronic 2/2  lymphedema. - Bedside RN to conduct dressing changes. Will need f/u in wound clinic post d/c  - on levofloxacin 750 mg qd    6. H/o Lymphoma - s/p R-CHOP in 2011 (potential cardiotoxic effects)    7. Type 2DM - Hgb A1c 6.0   8. CAD - diffuse mostly non-obstructive CAD on cath. Not cause of CM - medical management with ASA/statin  - If has angina can consider PCI PDA   9. Morbid obesity - needs weight loss - consider GLP1RA  10. Hypertension - Improving w/ Entresto, further titration as tolerated  - SCr/K stable   Length of Stay: [place] PA-C  08/31/2021, 8:09 AM  Advanced Heart Failure Team Pager [phone] (M-F; 7a - 4p)  Please contact [facility] for night-coverage after hours (4p -7a ) and weekends on [email or web address]   Agree with above.   Has had some return of conduction. Pacer now turned down to 40. Denies CP or SOB. Wound care following with large venous stasis ulcer.   General:  Lying in bed  No resp difficulty HEENT: normal Neck: supple. RIJ TVP . Carotids 2+ bilat; no bruits. No lymphadenopathy or thryomegaly appreciated. Cor: PMI nondisplaced. Regular rate & rhythm. No rubs, gallops or murmurs. Lungs: clear Abdomen: obese  soft, nontender, nondistended. No hepatosplenomegaly. No bruits or masses. Good bowel sounds. Extremities: no cyanosis, clubbing, rash, wrapped 1+ edema Neuro: alert & orientedx3, cranial nerves grossly intact. moves all 4 extremities w/o difficulty. Affect pleasant  Has had some return of conduction however we turned pacer off for a bit and he began to have prolonged symptomatic pauses. D/w EP regarding temp perm device given LE wounds versus CRT. Will plan for CRT tomorrow.   Volume status hard to assess but appears elevated. Wil restart  torsemide. Titrate GDMT as tolerated,   CRITICAL CARE Performed by:  Glori Bickers  Total critical care time: 35 minutes  Critical care time was exclusive of separately billable procedures and treating other patients.  Critical care was necessary to treat or prevent imminent or life-threatening deterioration.  Critical care was time spent personally by me (independent of midlevel providers or residents) on the following activities: development of treatment plan with patient and/or surrogate as well as nursing, discussions with consultants, evaluation of patient's response to treatment, examination of patient, obtaining history from patient or surrogate, ordering and performing treatments and interventions, ordering and review of laboratory studies, ordering and review of radiographic studies, pulse oximetry and re-evaluation of patient's condition.  Glori Bickers, MD  5:34 PM

## 2021-09-01 ENCOUNTER — Telehealth (HOSPITAL_COMMUNITY): Payer: Self-pay

## 2021-09-01 ENCOUNTER — Other Ambulatory Visit (HOSPITAL_COMMUNITY): Payer: Self-pay

## 2021-09-01 ENCOUNTER — Encounter (HOSPITAL_COMMUNITY)
Admission: EM | Disposition: A | Discharge status: Home Health | Payer: Self-pay | Source: Home / Self Care | Attending: Cardiovascular Disease

## 2021-09-01 ENCOUNTER — Ambulatory Visit (HOSPITAL_BASED_OUTPATIENT_CLINIC_OR_DEPARTMENT_OTHER): Payer: Medicare Other | Admitting: Physician Assistant

## 2021-09-01 DIAGNOSIS — I5022 Chronic systolic (congestive) heart failure: Secondary | ICD-10-CM

## 2021-09-01 DIAGNOSIS — I482 Chronic atrial fibrillation, unspecified: Secondary | ICD-10-CM

## 2021-09-01 DIAGNOSIS — I442 Atrioventricular block, complete: Secondary | ICD-10-CM

## 2021-09-01 HISTORY — PX: BIV ICD INSERTION CRT-D: EP1195

## 2021-09-01 LAB — GLUCOSE, CAPILLARY
Glucose-Capillary: 87 mg/dL (ref 70–99)
Glucose-Capillary: 98 mg/dL (ref 70–99)

## 2021-09-01 LAB — BASIC METABOLIC PANEL
Anion gap: 7 (ref 5–15)
BUN: 12 mg/dL (ref 8–23)
CO2: 29 mmol/L (ref 22–32)
Calcium: 8.9 mg/dL (ref 8.9–10.3)
Chloride: 102 mmol/L (ref 98–111)
Creatinine, Ser: 0.86 mg/dL (ref 0.61–1.24)
GFR, Estimated: >60 mL/min (ref >60)
Glucose, Bld: 95 mg/dL (ref 70–99)
Potassium: 4 mmol/L (ref 3.5–5.1)
Sodium: 138 mmol/L (ref 135–145)

## 2021-09-01 LAB — CBC
HCT: 41.1 % (ref 39.0–52.0)
Hemoglobin: 13.2 g/dL (ref 13.0–17.0)
MCH: 30.1 pg (ref 26.0–34.0)
MCHC: 32.1 g/dL (ref 30.0–36.0)
MCV: 93.8 fL (ref 80.0–100.0)
Platelets: 122 10*3/uL — ABNORMAL LOW (ref 150–400)
RBC: 4.38 MIL/uL (ref 4.22–5.81)
RDW: 17.1 % — ABNORMAL HIGH (ref 11.5–15.5)
WBC: 6.8 10*3/uL (ref 4.0–10.5)
nRBC: 0 % (ref 0.0–0.2)

## 2021-09-01 SURGERY — BIV ICD INSERTION CRT-D

## 2021-09-01 MED ORDER — SODIUM CHLORIDE 0.9 % IV SOLN
80.0000 mg | INTRAVENOUS | Status: AC
  Administered 2021-09-01: 80 mg
  Filled 2021-09-01: qty 2

## 2021-09-01 MED ORDER — SODIUM CHLORIDE 0.9 % IV SOLN
INTRAVENOUS | Status: DC
  Administered 2021-09-01: 10 mL/h via INTRAVENOUS

## 2021-09-01 MED ORDER — VANCOMYCIN HCL 1500 MG/300ML IV SOLN
1500.0000 mg | Freq: Once | INTRAVENOUS | Status: AC
  Administered 2021-09-02: 1500 mg via INTRAVENOUS
  Filled 2021-09-01: qty 300

## 2021-09-01 MED ORDER — MUPIROCIN 2 % EX OINT
1.0000 | TOPICAL_OINTMENT | Freq: Two times a day (BID) | CUTANEOUS | Status: DC
  Administered 2021-09-01 – 2021-09-03 (×6): 1 via NASAL
  Filled 2021-09-01 (×2): qty 22

## 2021-09-01 MED ORDER — HEPARIN (PORCINE) IN NACL 1000-0.9 UT/500ML-% IV SOLN
INTRAVENOUS | Status: DC | PRN
  Administered 2021-09-01: 500 mL

## 2021-09-01 MED ORDER — TORSEMIDE 20 MG PO TABS
20.0000 mg | ORAL_TABLET | Freq: Every day | ORAL | Status: DC
  Administered 2021-09-02 – 2021-09-03 (×2): 20 mg via ORAL
  Filled 2021-09-01 (×2): qty 1

## 2021-09-01 MED ORDER — MIDAZOLAM HCL 5 MG/5ML IJ SOLN
INTRAMUSCULAR | Status: DC | PRN
  Administered 2021-09-01: 1 mg via INTRAVENOUS
  Administered 2021-09-01: 2 mg via INTRAVENOUS
  Administered 2021-09-01: 1 mg via INTRAVENOUS

## 2021-09-01 MED ORDER — VANCOMYCIN HCL 1500 MG/300ML IV SOLN
1500.0000 mg | INTRAVENOUS | Status: AC
  Administered 2021-09-01: 1500 mg via INTRAVENOUS
  Filled 2021-09-01 (×2): qty 300

## 2021-09-01 MED ORDER — LIDOCAINE HCL (PF) 1 % IJ SOLN
INTRAMUSCULAR | Status: DC | PRN
  Administered 2021-09-01: 80 mL

## 2021-09-01 MED ORDER — ASPIRIN 81 MG PO CHEW
81.0000 mg | CHEWABLE_TABLET | Freq: Every day | ORAL | Status: DC
  Administered 2021-09-02 – 2021-09-04 (×3): 81 mg via ORAL
  Filled 2021-09-01 (×3): qty 1

## 2021-09-01 MED ORDER — LIDOCAINE HCL 1 % IJ SOLN
INTRAMUSCULAR | Status: AC
  Filled 2021-09-01: qty 60

## 2021-09-01 MED ORDER — DAPAGLIFLOZIN PROPANEDIOL 10 MG PO TABS
10.0000 mg | ORAL_TABLET | Freq: Every day | ORAL | Status: DC
  Administered 2021-09-02 – 2021-09-04 (×3): 10 mg via ORAL
  Filled 2021-09-01 (×4): qty 1

## 2021-09-01 MED ORDER — ACETAMINOPHEN 325 MG PO TABS
325.0000 mg | ORAL_TABLET | ORAL | Status: DC | PRN
  Administered 2021-09-01 – 2021-09-03 (×4): 650 mg via ORAL
  Filled 2021-09-01 (×4): qty 2

## 2021-09-01 MED ORDER — CHLORHEXIDINE GLUCONATE CLOTH 2 % EX PADS
6.0000 | MEDICATED_PAD | Freq: Every day | CUTANEOUS | Status: DC
  Administered 2021-09-02: 6 via TOPICAL

## 2021-09-01 MED ORDER — FENTANYL CITRATE (PF) 100 MCG/2ML IJ SOLN
INTRAMUSCULAR | Status: DC | PRN
Start: 2021-09-01 — End: 2021-09-01
  Administered 2021-09-01 (×2): 12.5 ug via INTRAVENOUS
  Administered 2021-09-01: 25 ug via INTRAVENOUS

## 2021-09-01 MED ORDER — SODIUM CHLORIDE 0.9 % IV SOLN
INTRAVENOUS | Status: AC
  Filled 2021-09-01: qty 2

## 2021-09-01 MED ORDER — ONDANSETRON HCL 4 MG/2ML IJ SOLN: 4.0000 mg | Freq: Four times a day (QID) | INTRAMUSCULAR | Status: DC | PRN

## 2021-09-01 MED ORDER — IOHEXOL 350 MG/ML SOLN
INTRAVENOUS | Status: DC | PRN
  Administered 2021-09-01: 10 mL
  Administered 2021-09-01: 30 mL

## 2021-09-01 SURGICAL SUPPLY — 23 items
BALLN ATTAIN 80 (BALLOONS) ×1
BALLOON ATTAIN 80 (BALLOONS) IMPLANT
CABLE SURGICAL S-101-97-12 (CABLE) ×2 IMPLANT
CATH ATTAIN COM SURV 6250V-EH (CATHETERS) IMPLANT
CATH ATTAIN COM SURV 6250V-MB2 (CATHETERS) IMPLANT
CATH HEX JOS 2-5-2 65CM 6F REP (CATHETERS) IMPLANT
CATH RIGHTSITE C315HIS02 (CATHETERS) IMPLANT
ICD EVERA XT MRI DF1  DDMB1D1 (ICD Generator) ×1 IMPLANT
ICD EVERA XT MRI DF1 DDMB1D1 (ICD Generator) IMPLANT
LEAD ATTAIN PERFORMA S 4598-88 (Lead) IMPLANT
LEAD ATTAIN QUAD 4798-88 (Lead) IMPLANT
LEAD CAPSURE NOVUS 5076-58CM (Lead) IMPLANT
LEAD SELECT SECURE 3830 383069 (Lead) IMPLANT
LEAD SPRINT QUAT SEC 6935-65CM (Lead) IMPLANT
MAT PREVALON FULL STRYKER (MISCELLANEOUS) IMPLANT
PAD DEFIB RADIO PHYSIO CONN (PAD) ×2 IMPLANT
SELECT SECURE 3830 383069 (Lead) ×1 IMPLANT
SHEATH 7FR PRELUDE SNAP 13 (SHEATH) IMPLANT
SHEATH 9.5FR PRELUDE SNAP 13 (SHEATH) IMPLANT
SLITTER 6232ADJ (MISCELLANEOUS) IMPLANT
TRAY PACEMAKER INSERTION (PACKS) ×2 IMPLANT
WIRE ACUITY WHISPER EDS 4648 (WIRE) IMPLANT
WIRE HI TORQ VERSACORE-J 145CM (WIRE) IMPLANT

## 2021-09-01 NOTE — Telephone Encounter (Signed)
Pharmacy Patient Advocate Encounter  Insurance verification completed.    The patient is insured through [facility]   The patient is currently admitted and ran test claims for the following: [place].  Copays and coinsurance results were relayed to Inpatient clinical team.

## 2021-09-01 NOTE — Progress Notes (Addendum)
Advanced Heart Failure Rounding Note   Subjective:    Remains pacer dependent w/ TVP. Getting BiV ICD placed today.   Torsemide restarted yesterday. Brisk diuresis, 8.4L in UOP. SCr stable, 0.86. K 4.0   Feels well today. No complaints. Denies dyspnea.    Objective:   Weight Range:  Vital Signs:   Temp:  [97.6 F (36.4 C)-98.2 F (36.8 C)] 98 F (36.7 C) (08/23 0400) Pulse Rate:  [60-85] 79 (08/23 0600) Resp:  [8-27] 21 (08/23 0600) BP: (107-138)/(60-93) 122/79 (08/23 0600) SpO2:  [84 %-98 %] 96 % (08/23 0600) Last BM Date : 08/29/21  Weight change: Filed Weights   08/29/21 0500 08/30/21 0500 08/31/21 0500  Weight: (!) 161.9 kg (!) 160.2 kg (!) 160.2 kg    Intake/Output:   Intake/Output Summary (Last 24 hours) at 09/01/2021 0757 Last data filed at 09/01/2021 0200 Gross per 24 hour  Intake 361.14 ml  Output 7725 ml  Net -7363.86 ml     PHYSICAL EXAM: General:  Well appearing, obese. No respiratory difficulty HEENT: normal Neck: supple, thick neck JVD not well visualized, Rt IJ TVP Carotids 2+ bilat; no bruits. No lymphadenopathy or thyromegaly appreciated. Cor: PMI nondisplaced. Regular rate & rhythm. No rubs, gallops or murmurs. Lungs: clear Abdomen: soft, nontender, nondistended. No hepatosplenomegaly. No bruits or masses. Good bowel sounds. Extremities: no cyanosis, clubbing, rash, b/l legs wrapped, trace edema  Neuro: alert & oriented x 3, cranial nerves grossly intact. moves all 4 extremities w/o difficulty. Affect pleasant.   Telemetry:  AF 70s, wide QRS  Personally reviewed  Labs: Basic Metabolic Panel: Recent Labs  Lab 08/26/21 1253 08/27/21 0633 08/27/21 1521 08/27/21 1528 08/28/21 0439 08/29/21 0904 08/30/21 0541 08/31/21 0100  NA 137 135   < > 140  139 137 137 138 137  K 5.0 4.8   < > 4.7  4.9 5.6* 4.4 4.4 4.4  CL 105 102  --   --  105 107 108 108  CO2 22 21*  --   --  '25 23 25 23  '$ GLUCOSE 109* 90  --   --  104* 110* 100* 100*   BUN 20 25*  --   --  25* '18 16 14  '$ CREATININE 1.51* 1.68*  --   --  1.48* 0.94 0.93 0.76  CALCIUM 8.9 9.0  --   --  9.0 8.8* 8.8* 8.7*  MG 1.8  --   --   --   --   --   --   --   PHOS 4.1  --   --   --   --   --   --   --    < > = values in this interval not displayed.    Liver Function Tests: Recent Labs  Lab 08/26/21 1253  AST 21  ALT 10  ALKPHOS 120  BILITOT 1.8*  PROT 6.8  ALBUMIN 3.3*   No results for input(s): "LIPASE", "AMYLASE" in the last 168 hours. No results for input(s): "AMMONIA" in the last 168 hours.  CBC: Recent Labs  Lab 08/26/21 1253 08/27/21 1521 08/27/21 1528 08/29/21 0904 08/30/21 0541 08/31/21 0100 09/01/21 0715  WBC 6.9  --   --  6.5 6.5 6.3 6.8  NEUTROABS 5.1  --   --   --   --   --   --   HGB 13.0   < > 12.9*  13.3 12.6* 12.9* 12.7* 13.2  HCT 41.3   < > 38.0*  39.0 39.8 41.7 39.2 41.1  MCV 95.4  --   --  95.2 96.1 94.2 93.8  PLT 129*  --   --  108* 118* 115* 122*   < > = values in this interval not displayed.    Cardiac Enzymes: No results for input(s): "CKTOTAL", "CKMB", "CKMBINDEX", "TROPONINI" in the last 168 hours.  BNP: BNP (last 3 results) Recent Labs    08/02/21 1150 08/26/21 1259  BNP 475.7* 655.7*    ProBNP (last 3 results) No results for input(s): "PROBNP" in the last 8760 hours.    Other results:  Imaging: No results found.   Medications:     Scheduled Medications:  aspirin  81 mg Oral Daily   atorvastatin  40 mg Oral QHS   Chlorhexidine Gluconate Cloth  6 each Topical Daily   gentamicin (GARAMYCIN) 80 mg in sodium chloride 0.9 % 500 mL irrigation  80 mg Irrigation On Call   insulin aspart  0-15 Units Subcutaneous TID WC   levofloxacin  750 mg Oral QHS   mupirocin ointment   Nasal BID   sacubitril-valsartan  1 tablet Oral BID   sertraline  50 mg Oral Daily   sodium chloride flush  3 mL Intravenous Q12H   sodium chloride flush  3 mL Intravenous Q12H   sodium chloride flush  3 mL Intravenous Q12H    spironolactone  12.5 mg Oral Daily   torsemide  20 mg Oral Daily    Infusions:  sodium chloride     sodium chloride 50 mL/hr at 08/30/21 8341   sodium chloride     vancomycin      PRN Medications: sodium chloride, sodium chloride, acetaminophen, atropine, ondansetron (ZOFRAN) IV, mouth rinse, sodium chloride flush, sodium chloride flush   Assessment/Plan   Acute on Chronic Systolic Biventricular Heart Failure - Echo 02/2017 EF 25-30% w/ diffuse HK. RV normal. Felt to be viral CM +/- component of tachymediated in setting of influenza PNA and rapid Afib  - Echo 07/2017 EF recovered, 50-55%, RV mildly reduced - Echo 11/2018 EF 45-50%, RV mildly reduced (in rate controlled afib)  - Echo 08/23/21 EF 30-35%, severe LVH, moderately reduced RV systolic function, moderate TVR - NYHA Class III, confounded by obesity  - R/L cath 08/27/21. Diffuse non-obstructive CAD with 90% lesion in dLAD - RHC cath with biventricular failure R>L: RA 21 PA 56/22 (36) PCW 24 Fick 7.3/2.7 PaPi 2.1 - given concomitant conduction disease, profound bradycardia and LVH, consider Amyloid w/u - unable to get cMRI with TVP  - GDMT initially limited by AKI and hypotension, now resolved   - continue GDMT titration as tolerated.  - Continue Entresto 24-26 mg bid  - Continue Spiro 12.5 mg daily  - Add Farxiga 10 mg daily  - Continue Torsemide 20 mg daily  - no ? blocker/digoxin w/ bradycardia     2. Profound Bradycardia w/ High-grade AV block - s/p placement of TVP  - off coreg - has known RBBB  - remains pacer dependent w/ TVP - EP planning BiV ICD today   3. Chronic Afib - dates back to at least 2019, has been historically well rate controlled - eliquis PTA, holding for possible device implant. Continue heparin gtt  - has never had attempt at DCCV. Given chronicity , tx w/ rhythm control at this point may be difficult  - size may prohibit ablation  - continue tx of OSA w/ CPAP    4. AKI due to ATN - b/w Scr  0.9 - 1.51 on admit, >>1.68 -> 1.48 -> 0.94 ->0.76->0.86  - suspect 2/2 hypotension/ bradycardia - follow BMP    5. PAD/ Chronic Venous Stasis Wounds  - s/p left transmetatarsal amputation - followed by [facility] and Wound clinic  - Seen by [place] RN. Wounds felt to be chronic 2/2  lymphedema. - Bedside RN to conduct dressing changes. Will need f/u in wound clinic post d/c  - on levofloxacin 750 mg qd    6. H/o Lymphoma - s/p R-CHOP in 2011 (potential cardiotoxic effects)    7. Type 2DM - Hgb A1c 6.0   8. CAD - diffuse mostly non-obstructive CAD on cath. Not cause of CM - medical management with ASA/statin  - If has angina can consider PCI PDA   9. Morbid obesity - needs weight loss - consider GLP1RA  10. Hypertension - Improving w/ Entresto, further titration as tolerated  - SCr/K stable   Length of Stay: [place] PA-C  09/01/2021, 7:57 AM  Advanced Heart Failure Team Pager [phone] (M-F; [place])  Please contact [facility] for night-coverage after hours (4p -7a ) and weekends on [email or web address]   Patient seen and examined with the above-signed Advanced Practice Provider and/or Housestaff. I personally reviewed laboratory data, imaging studies and relevant notes. I independently examined the patient and formulated the important aspects of the plan. I have edited the note to reflect any of my changes or salient points. I have personally discussed the plan with the patient and/or family.  Remains pacer dependent. For CRT-D today.   Diuresed briskly with po torsemide yesterday. Feels better. Renal function stable.   LE wounds being treated   General:  Obese male lying in bed. No resp difficulty HEENT: normal Neck: supple. JVP to jaw  RIJ TVP  Carotids 2+ bilat; no bruits. No lymphadenopathy or thryomegaly appreciated. Cor: PMI nondisplaced. Regular rate & rhythm. No rubs, gallops or murmurs. Lungs: clear Abdomen: obese soft, nontender, nondistended. No  hepatosplenomegaly. No bruits or masses. Good bowel sounds. Extremities: no cyanosis, clubbing, rash, 2+ edema wrapped  Neuro: alert & orientedx3, cranial nerves grossly intact. moves all 4 extremities w/o difficulty. Affect pleasant  Agree with CRT-D. Will continue po diuresis. Adjust GDMT as tolerated. Aggressive care of leg wounds. Aware of risk of device infection with LE wounds but benefits clearly outweigh risks at this point.   Glori Bickers, MD  11:53 AM

## 2021-09-01 NOTE — H&P (View-Only) (Signed)
Electrophysiology Rounding Note  Patient Name: [name] Date of Encounter: 09/01/2021  Primary Cardiologist: Quay Burow, MD Electrophysiologist: None   Subjective   The patient is doing well today.  At this time, the patient denies chest pain, shortness of breath, or any new concerns.  Inpatient Medications    Scheduled Meds:  aspirin  81 mg Oral Daily   atorvastatin  40 mg Oral QHS   Chlorhexidine Gluconate Cloth  6 each Topical Daily   gentamicin (GARAMYCIN) 80 mg in sodium chloride 0.9 % 500 mL irrigation  80 mg Irrigation On Call   insulin aspart  0-15 Units Subcutaneous TID WC   levofloxacin  750 mg Oral QHS   mupirocin ointment   Nasal BID   sacubitril-valsartan  1 tablet Oral BID   sertraline  50 mg Oral Daily   sodium chloride flush  3 mL Intravenous Q12H   sodium chloride flush  3 mL Intravenous Q12H   sodium chloride flush  3 mL Intravenous Q12H   spironolactone  12.5 mg Oral Daily   torsemide  20 mg Oral Daily   Continuous Infusions:  sodium chloride     sodium chloride 50 mL/hr at 08/30/21 0619   sodium chloride     vancomycin     PRN Meds: sodium chloride, sodium chloride, acetaminophen, atropine, ondansetron (ZOFRAN) IV, mouth rinse, sodium chloride flush, sodium chloride flush   Vital Signs    Vitals:   09/01/21 0400 09/01/21 0500 09/01/21 0600 09/01/21 0700  BP: 112/60 110/63 122/79   Pulse: 73 74 79 79  Resp: (!) 25 16 (!) 21 20  Temp: 98 F (36.7 C)     TempSrc: Oral     SpO2: 93% 95% 96% 95%  Weight:      Height:        Intake/Output Summary (Last 24 hours) at 09/01/2021 0813 Last data filed at 09/01/2021 0600 Gross per 24 hour  Intake 335.66 ml  Output 8425 ml  Net -8089.34 ml   Filed Weights   08/29/21 0500 08/30/21 0500 08/31/21 0500  Weight: (!) 161.9 kg (!) 160.2 kg (!) 160.2 kg    Physical Exam    GEN- The patient is obese and chronically ill appearing, alert and oriented x 3 today.   Head- normocephalic,  atraumatic Eyes-  Sclera clear, conjunctiva pink Ears- hearing intact Oropharynx- clear Neck- supple Lungs- Clear to ausculation bilaterally, normal work of breathing Heart- Irregularly irregular rate and rhythm, no murmurs, rubs or gallops GI- soft, NT, ND, + BS Extremities- Bilateral chronic lymphedema. S/p left transmetarsal amputation. Compressive wound dressings in place.  Skin- no rash or lesion Psych- appropriate affect.   Labs    CBC Recent Labs    08/31/21 0100 09/01/21 0715  WBC 6.3 6.8  HGB 12.7* 13.2  HCT 39.2 41.1  MCV 94.2 93.8  PLT 115* 027*   Basic Metabolic Panel Recent Labs    08/31/21 0100 09/01/21 0715  NA 137 138  K 4.4 4.0  CL 108 102  CO2 23 29  GLUCOSE 100* 95  BUN 14 12  CREATININE 0.76 0.86  CALCIUM 8.7* 8.9   Liver Function Tests No results for input(s): "AST", "ALT", "ALKPHOS", "BILITOT", "PROT", "ALBUMIN" in the last 72 hours. No results for input(s): "LIPASE", "AMYLASE" in the last 72 hours. Cardiac Enzymes No results for input(s): "CKTOTAL", "CKMB", "CKMBINDEX", "TROPONINI" in the last 72 hours.   Telemetry    AF with variable rates and occasional pauses with paced  beat following rates 60-80s (personally reviewed)  Radiology    No results found.  Patient Profile     CAMDEN KNOTEK is a 66 y.o. male with a hx of HFREF, perm atrial fib, obesity, HTN, DM and sleep apnea who is being seen for the evaluation of complete heart block   Assessment & Plan    Complete Heart block 2. IVCD The patients conduction has improve, but he continues to have 4-7 second, symptomatic pauses when pacing is turned down.  Pt has a very broad QRS at 170-195 ms and given his severe LV dysfunction will plan for CRT-D by Dr. Lovena Le today.  Explained risks, benefits, and alternatives to ICD implantation, including but not limited to bleeding, infection, pneumothorax, pericardial effusion, lead dislodgement, heart attack, stroke, or death.  Pt verbalized  understanding and agrees to proceed.  He understands he is at a higher risk of infection, and may end up requiring chronic suppressive antibiotics.   3. LE wounds Most recent picture in chart 06/2021.  WOC assessment is this will likely be a continued and chronic issue. Unlikely to have significant improvement anytime soon given chronic lymphedema.  May require chronic suppressive antibiotics   4. Acute on chronic systolic CHF HF team following, adjusting GDMT as tolerated.  Planning for CRTD today which will allow more aggressive up titration of BB  Dr. Lovena Le has seen.    For questions or updates, please contact [place] Please consult [email or web address] for contact info under Cardiology/STEMI.  Signed, Shirley Friar, PA-C  09/01/2021, 8:13 AM   EP Attending  Patient seen and examined. Agree with above. The patient has persistent heart block though intermittent with long pauses, requiring back up pacing. He has chronic systolic heart failure and an IVCD. Hehas chronic atrial fib. His exam demonstrates a stable appearing obese man, NAD with an indwelling right IJ pacing lead, IRIR rhythm and warm extremities. Tele with atrial fib. I have discussed the options including biv PPM vs ICD and he wishes to have the ICD placed. The risks/benefits/goals/expectations were reviewed he would like to proceed.  Carleene Overlie Keean Wilmeth,MD

## 2021-09-01 NOTE — TOC Benefit Eligibility Note (Signed)
Patient Research scientist (life sciences) completed.     The patient is currently admitted and upon discharge could be taking Jardiance.   The current 30 day co-pay is, $37.   The patient is insured through [facility].

## 2021-09-01 NOTE — Telephone Encounter (Signed)
Pharmacy Patient Advocate Encounter  Insurance verification completed.    The patient is insured through [facility]   The patient is currently admitted and ran test claims for the following: [place].  Copays and coinsurance results were relayed to Inpatient clinical team.

## 2021-09-01 NOTE — TOC Benefit Eligibility Note (Signed)
Patient Research scientist (life sciences) completed.     The patient is currently admitted and upon discharge could be taking [place].   The current 30 day co-pay is, $37.   The patient is insured through [facility].

## 2021-09-01 NOTE — Progress Notes (Signed)
ANTICOAGULATION CONSULT NOTE - Follow Up Consult  Pharmacy Consult for IV Heparin Indication: atrial fibrillation  Allergies  Allergen Reactions   Latex Hives and Swelling   Ace Inhibitors     Other reaction(s): cough   Penicillin G     Other reaction(s): rash Has tolerated amoxicillin    Patient Measurements: Height: '5\' 11"'$  (180.3 cm) Weight: (!) 160.2 kg (353 lb 2.8 oz) IBW/kg (Calculated) : 75.3 Heparin Dosing Weight: 111.6 kg  Vital Signs: Temp: 98 F (36.7 C) (08/23 0400) Temp Source: Oral (08/23 0400) BP: 112/95 (08/23 0800) Pulse Rate: 75 (08/23 0800)  Labs: Recent Labs    08/30/21 0541 08/30/21 1402 08/31/21 0100 08/31/21 1044 09/01/21 0715  HGB 12.9*  --  12.7*  --  13.2  HCT 41.7  --  39.2  --  41.1  PLT 118*  --  115*  --  122*  APTT 54* 47* 49*  --   --   HEPARINUNFRC 0.27* 0.16* 0.13* 0.17*  --   CREATININE 0.93  --  0.76  --  0.86     Estimated Creatinine Clearance: 130.6 mL/min (by C-G formula based on SCr of 0.86 mg/dL).   Assessment: 66 years of age male on Apixaban prior to admission for atrial fibrillation. Last dose of Apixaban was on 8/17 AM. Pharmacy consulted post cath to start IV Heparin 2 hr after TR band removal.   Heparin drip turned off last pm in plans for pacer placement today  No bleeding noted cbc stable   Goal of Therapy:  Heparin level 0.3-0.7 units/ml aPTT 66-102 seconds Monitor platelets by anticoagulation protocol: Yes   Plan:  Follow up post pacer placement for anticoagulation plans  Bmet cbc mag in am    Aristes.D. CPP, BCPS Clinical Pharmacist [phone] 09/01/2021 9:22 AM   Please see amion for complete clinical pharmacist phone list 09/01/2021 9:22 AM

## 2021-09-01 NOTE — Progress Notes (Signed)
Wound care/dsg note:  BLE wounds not measured d/t una boots being in place.  Will place nursing care order for this to be done before una boots changed on Thursday.

## 2021-09-01 NOTE — Progress Notes (Addendum)
Electrophysiology Rounding Note  Patient Name: [name] Date of Encounter: 09/01/2021  Primary Cardiologist: Quay Burow, MD Electrophysiologist: None   Subjective   The patient is doing well today.  At this time, the patient denies chest pain, shortness of breath, or any new concerns.  Inpatient Medications    Scheduled Meds:  aspirin  81 mg Oral Daily   atorvastatin  40 mg Oral QHS   Chlorhexidine Gluconate Cloth  6 each Topical Daily   gentamicin (GARAMYCIN) 80 mg in sodium chloride 0.9 % 500 mL irrigation  80 mg Irrigation On Call   insulin aspart  0-15 Units Subcutaneous TID WC   levofloxacin  750 mg Oral QHS   mupirocin ointment   Nasal BID   sacubitril-valsartan  1 tablet Oral BID   sertraline  50 mg Oral Daily   sodium chloride flush  3 mL Intravenous Q12H   sodium chloride flush  3 mL Intravenous Q12H   sodium chloride flush  3 mL Intravenous Q12H   spironolactone  12.5 mg Oral Daily   torsemide  20 mg Oral Daily   Continuous Infusions:  sodium chloride     sodium chloride 50 mL/hr at 08/30/21 0619   sodium chloride     vancomycin     PRN Meds: sodium chloride, sodium chloride, acetaminophen, atropine, ondansetron (ZOFRAN) IV, mouth rinse, sodium chloride flush, sodium chloride flush   Vital Signs    Vitals:   09/01/21 0400 09/01/21 0500 09/01/21 0600 09/01/21 0700  BP: 112/60 110/63 122/79   Pulse: 73 74 79 79  Resp: (!) 25 16 (!) 21 20  Temp: 98 F (36.7 C)     TempSrc: Oral     SpO2: 93% 95% 96% 95%  Weight:      Height:        Intake/Output Summary (Last 24 hours) at 09/01/2021 0813 Last data filed at 09/01/2021 0600 Gross per 24 hour  Intake 335.66 ml  Output 8425 ml  Net -8089.34 ml   Filed Weights   08/29/21 0500 08/30/21 0500 08/31/21 0500  Weight: (!) 161.9 kg (!) 160.2 kg (!) 160.2 kg    Physical Exam    GEN- The patient is obese and chronically ill appearing, alert and oriented x 3 today.   Head- normocephalic,  atraumatic Eyes-  Sclera clear, conjunctiva pink Ears- hearing intact Oropharynx- clear Neck- supple Lungs- Clear to ausculation bilaterally, normal work of breathing Heart- Irregularly irregular rate and rhythm, no murmurs, rubs or gallops GI- soft, NT, ND, + BS Extremities- Bilateral chronic lymphedema. S/p left transmetarsal amputation. Compressive wound dressings in place.  Skin- no rash or lesion Psych- appropriate affect.   Labs    CBC Recent Labs    08/31/21 0100 09/01/21 0715  WBC 6.3 6.8  HGB 12.7* 13.2  HCT 39.2 41.1  MCV 94.2 93.8  PLT 115* 016*   Basic Metabolic Panel Recent Labs    08/31/21 0100 09/01/21 0715  NA 137 138  K 4.4 4.0  CL 108 102  CO2 23 29  GLUCOSE 100* 95  BUN 14 12  CREATININE 0.76 0.86  CALCIUM 8.7* 8.9   Liver Function Tests No results for input(s): "AST", "ALT", "ALKPHOS", "BILITOT", "PROT", "ALBUMIN" in the last 72 hours. No results for input(s): "LIPASE", "AMYLASE" in the last 72 hours. Cardiac Enzymes No results for input(s): "CKTOTAL", "CKMB", "CKMBINDEX", "TROPONINI" in the last 72 hours.   Telemetry    AF with variable rates and occasional pauses with paced  beat following rates 60-80s (personally reviewed)  Radiology    No results found.  Patient Profile     [name] is a 66 y.o. male with a hx of HFREF, perm atrial fib, obesity, HTN, DM and sleep apnea who is being seen for the evaluation of complete heart block   Assessment & Plan    Complete Heart block 2. IVCD The patients conduction has improve, but he continues to have 4-7 second, symptomatic pauses when pacing is turned down.  Pt has a very broad QRS at 170-195 ms and given his severe LV dysfunction will plan for CRT-D by Dr. Lovena Le today.  Explained risks, benefits, and alternatives to ICD implantation, including but not limited to bleeding, infection, pneumothorax, pericardial effusion, lead dislodgement, heart attack, stroke, or death.  Pt verbalized  understanding and agrees to proceed.  He understands he is at a higher risk of infection, and may end up requiring chronic suppressive antibiotics.   3. LE wounds Most recent picture in chart 06/2021.  WOC assessment is this will likely be a continued and chronic issue. Unlikely to have significant improvement anytime soon given chronic lymphedema.  May require chronic suppressive antibiotics   4. Acute on chronic systolic CHF HF team following, adjusting GDMT as tolerated.  Planning for CRTD today which will allow more aggressive up titration of BB  Dr. Lovena Le has seen.    For questions or updates, please contact [place] Please consult [email or web address] for contact info under Cardiology/STEMI.  Signed, Shirley Friar, PA-C  09/01/2021, 8:13 AM   EP Attending  Patient seen and examined. Agree with above. The patient has persistent heart block though intermittent with long pauses, requiring back up pacing. He has chronic systolic heart failure and an IVCD. Hehas chronic atrial fib. His exam demonstrates a stable appearing obese man, NAD with an indwelling right IJ pacing lead, IRIR rhythm and warm extremities. Tele with atrial fib. I have discussed the options including biv PPM vs ICD and he wishes to have the ICD placed. The risks/benefits/goals/expectations were reviewed he would like to proceed.  Carleene Overlie Traci Plemons,MD

## 2021-09-01 NOTE — Interval H&P Note (Signed)
History and Physical Interval Note:  09/01/2021 3:34 PM  [name]  has presented today for surgery, with the diagnosis of heart block.  The various methods of treatment have been discussed with the patient and family. After consideration of risks, benefits and other options for treatment, the patient has consented to  Procedure(s): BIV ICD INSERTION CRT-D (N/A) as a surgical intervention.  The patient's history has been reviewed, patient examined, no change in status, stable for surgery.  I have reviewed the patient's chart and labs.  Questions were answered to the patient's satisfaction.     Cristopher Peru

## 2021-09-02 ENCOUNTER — Inpatient Hospital Stay (HOSPITAL_COMMUNITY): Payer: Medicare Other

## 2021-09-02 ENCOUNTER — Encounter (HOSPITAL_COMMUNITY): Payer: Self-pay | Admitting: Internal Medicine

## 2021-09-02 DIAGNOSIS — I442 Atrioventricular block, complete: Secondary | ICD-10-CM | POA: Diagnosis not present

## 2021-09-02 DIAGNOSIS — I5023 Acute on chronic systolic (congestive) heart failure: Secondary | ICD-10-CM | POA: Diagnosis not present

## 2021-09-02 DIAGNOSIS — I5082 Biventricular heart failure: Secondary | ICD-10-CM | POA: Diagnosis not present

## 2021-09-02 LAB — CBC
HCT: 38.9 % — ABNORMAL LOW (ref 39.0–52.0)
Hemoglobin: 12.4 g/dL — ABNORMAL LOW (ref 13.0–17.0)
MCH: 30.2 pg (ref 26.0–34.0)
MCHC: 31.9 g/dL (ref 30.0–36.0)
MCV: 94.9 fL (ref 80.0–100.0)
Platelets: 114 10*3/uL — ABNORMAL LOW (ref 150–400)
RBC: 4.1 MIL/uL — ABNORMAL LOW (ref 4.22–5.81)
RDW: 17 % — ABNORMAL HIGH (ref 11.5–15.5)
WBC: 6.9 10*3/uL (ref 4.0–10.5)
nRBC: 0 % (ref 0.0–0.2)

## 2021-09-02 LAB — BASIC METABOLIC PANEL
Anion gap: 9 (ref 5–15)
BUN: 12 mg/dL (ref 8–23)
CO2: 28 mmol/L (ref 22–32)
Calcium: 8.4 mg/dL — ABNORMAL LOW (ref 8.9–10.3)
Chloride: 102 mmol/L (ref 98–111)
Creatinine, Ser: 0.8 mg/dL (ref 0.61–1.24)
GFR, Estimated: >60 mL/min (ref >60)
Glucose, Bld: 95 mg/dL (ref 70–99)
Potassium: 4 mmol/L (ref 3.5–5.1)
Sodium: 139 mmol/L (ref 135–145)

## 2021-09-02 LAB — GLUCOSE, CAPILLARY
Glucose-Capillary: 107 mg/dL — ABNORMAL HIGH (ref 70–99)
Glucose-Capillary: 98 mg/dL (ref 70–99)
Glucose-Capillary: 81 mg/dL (ref 70–99)
Glucose-Capillary: 94 mg/dL (ref 70–99)
Glucose-Capillary: 148 mg/dL — ABNORMAL HIGH (ref 70–99)

## 2021-09-02 LAB — MAGNESIUM: Magnesium: 1.4 mg/dL — ABNORMAL LOW (ref 1.7–2.4)

## 2021-09-02 MED ORDER — MAGNESIUM SULFATE 4 GM/100ML IV SOLN
4.0000 g | Freq: Once | INTRAVENOUS | Status: DC
  Filled 2021-09-02: qty 100

## 2021-09-02 MED ORDER — MAGNESIUM SULFATE 2 GM/50ML IV SOLN
2.0000 g | Freq: Once | INTRAVENOUS | Status: AC
Start: 2021-09-02 — End: 2021-09-02
  Administered 2021-09-02: 2 g via INTRAVENOUS

## 2021-09-02 MED ORDER — APIXABAN 5 MG PO TABS: 5.0000 mg | ORAL_TABLET | Freq: Two times a day (BID) | ORAL | Status: DC

## 2021-09-02 MED ORDER — MAGNESIUM SULFATE 2 GM/50ML IV SOLN
2.0000 g | Freq: Once | INTRAVENOUS | Status: DC
  Filled 2021-09-02: qty 50

## 2021-09-02 MED ORDER — MAGNESIUM SULFATE 4 GM/100ML IV SOLN
4.0000 g | Freq: Once | INTRAVENOUS | Status: AC
Start: 2021-09-02 — End: 2021-09-02
  Administered 2021-09-02: 4 g via INTRAVENOUS

## 2021-09-02 NOTE — Progress Notes (Signed)
Orthopedic Tech Progress Note Patient Details:  [name] [birth year] [id number]  Patient ID: [name], male   DOB: Nov 29, 1955, 66 y.o.   MRN: [id number] I spoked with the rn. They said the patient has their arm sling. Karolee Stamps 09/02/2021, 5:21 AM

## 2021-09-02 NOTE — Progress Notes (Addendum)
Electrophysiology Rounding Note  Patient Name: [name] Date of Encounter: 09/02/2021  Primary Cardiologist: Quay Burow, MD Electrophysiologist: Dr. Lovena Le   Subjective   Slightly sore, but otherwise no complaints.  Inpatient Medications    Scheduled Meds:  [START ON 09/05/2021] apixaban  5 mg Oral BID   aspirin  81 mg Oral Daily   atorvastatin  40 mg Oral QHS   Chlorhexidine Gluconate Cloth  6 each Topical Daily   Chlorhexidine Gluconate Cloth  6 each Topical Q0600   dapagliflozin propanediol  10 mg Oral Daily   insulin aspart  0-15 Units Subcutaneous TID WC   levofloxacin  750 mg Oral QHS   mupirocin ointment  1 Application Nasal BID   sacubitril-valsartan  1 tablet Oral BID   sertraline  50 mg Oral Daily   sodium chloride flush  3 mL Intravenous Q12H   sodium chloride flush  3 mL Intravenous Q12H   sodium chloride flush  3 mL Intravenous Q12H   spironolactone  12.5 mg Oral Daily   torsemide  20 mg Oral Daily   Continuous Infusions:  sodium chloride     sodium chloride Stopped (09/02/21 0355)   sodium chloride 50 mL/hr at 09/02/21 0808   PRN Meds: sodium chloride, sodium chloride, acetaminophen, atropine, ondansetron (ZOFRAN) IV, mouth rinse, sodium chloride flush, sodium chloride flush   Vital Signs    Vitals:   09/02/21 0500 09/02/21 0530 09/02/21 0600 09/02/21 0630  BP: (!) 98/55 99/62 105/67 106/70  Pulse: 69 69 70 70  Resp: 19 (!) 25 19 (!) 26  Temp:      TempSrc:      SpO2: 95% 96% 95% 95%  Weight: (!) 153.3 kg     Height:        Intake/Output Summary (Last 24 hours) at 09/02/2021 0850 Last data filed at 09/02/2021 0600 Gross per 24 hour  Intake 1570.95 ml  Output 1450 ml  Net 120.95 ml   Filed Weights   08/30/21 0500 08/31/21 0500 09/02/21 0500  Weight: (!) 160.2 kg (!) 160.2 kg (!) 153.3 kg    Physical Exam    GEN- The patient is well appearing, alert and oriented x 3 today.   Head- normocephalic, atraumatic Eyes-  Sclera  clear, conjunctiva pink Ears- hearing intact Oropharynx- clear Neck- supple Lungs- Clear to ausculation bilaterally, normal work of breathing Heart- Regular rate and rhythm, no murmurs, rubs or gallops GI- soft, NT, ND, + BS Extremities- no clubbing or cyanosis. No edema Skin- no rash or lesion Psych- euthymic mood, full affect Neuro- strength and sensation are intact  Labs    CBC Recent Labs    09/01/21 0715 09/02/21 0617  WBC 6.8 6.9  HGB 13.2 12.4*  HCT 41.1 38.9*  MCV 93.8 94.9  PLT 122* 992*   Basic Metabolic Panel Recent Labs    09/01/21 0715 09/02/21 0617  NA 138 139  K 4.0 4.0  CL 102 102  CO2 29 28  GLUCOSE 95 95  BUN 12 12  CREATININE 0.86 0.80  CALCIUM 8.9 8.4*  MG  --  1.4*   Liver Function Tests No results for input(s): "AST", "ALT", "ALKPHOS", "BILITOT", "PROT", "ALBUMIN" in the last 72 hours. No results for input(s): "LIPASE", "AMYLASE" in the last 72 hours. Cardiac Enzymes No results for input(s): "CKTOTAL", "CKMB", "CKMBINDEX", "TROPONINI" in the last 72 hours.   Telemetry    V pacing 60-70s (personally reviewed)  Radiology    EP PPM/ICD IMPLANT  Result  Date: 09/01/2021 Conclusion: Successful ICD implantation in a patient with complete heart block, chronic systolic heart failure, and chronic atrial fibrillation.  A left bundle area lead was placed in the atrial port with a very nice narrow QRS. Cristopher Peru, MD    Patient Profile     [name] is a 66 y.o. male with a hx of HFREF, perm atrial fib, obesity, HTN, DM and sleep apnea who is being seen for the evaluation of complete heart block   Assessment & Plan    Complete Heart block 2. IVCD S/p MDT BiV ICD 09/01/2021 Site stable.  Wound care and arm restrictions reviewed personally with patient and placed in AVS.  Usual follow up arranged.  CXR pending He understands high risk of infection without aggressive LE wound management, and that he may end up requiring chronic  suppressive antibiotics.    3. LE wounds Most recent picture in chart 06/2021.  WOC assessment is this will likely be a continued and chronic issue. Unlikely to have significant improvement anytime soon given chronic lymphedema.  May require chronic suppressive antibiotics   4. Acute on chronic systolic CHF HF team following, adjusting GDMT as tolerated.   5. Permanent atrial fib Titrate BB for CHF as tolerated now s/p CRT-D Resume Eliquis Sunday, 8/27 with the evening dose.    For questions or updates, please contact [place] Please consult [email or web address] for contact info under Cardiology/STEMI.  Signed, Shirley Friar, PA-C  09/02/2021, 8:50 AM    EP attending  Patient seen and examined.  Agree with the findings as noted above.  The patient is status post dual-chamber ICD insertion with an ICD lead in the RV apical septum and a left bundle area lead placed and is pacing normally.  Interrogation of his dual-chamber ICD demonstrates normal device function.  His pacemaker pocket demonstrates no hematoma.  Chest x-ray demonstrates stable lead position.  The patient is doing well today and can be discharged home whenever he is felt to be stable enough from a heart failure perspective.  Usual device follow-up as noted above.  Cristopher Peru, MD

## 2021-09-02 NOTE — Discharge Instructions (Signed)
After Your ICD (Implantable Cardiac Defibrillator)   You have a Medtronic ICD  ACTIVITY Do not lift your arm above shoulder height for 1 week after your procedure. After 7 days, you may progress as below.  You should remove your sling 24 hours after your procedure, unless otherwise instructed by your provider.     Thursday September 09, 2021  Friday September 10, 2021 Saturday September 11, 2021 Sunday September 12, 2021   Do not lift, push, pull, or carry anything over 10 pounds with the affected arm until 6 weeks (Thursday October 14, 2021 ) after your procedure.   You may drive AFTER your wound check, unless you have been told otherwise by your provider.   Ask your healthcare provider when you can go back to work   INCISION/Dressing Resume Eliquis 5 mg on Sunday evening, 09/05/2021  If large square, outer bandage is left in place, this can be removed after 24 hours from your procedure. Do not remove steri-strips or glue as below.   Monitor your defibrillator site for redness, swelling, and drainage. Call the device clinic at [phone] if you experience these symptoms or fever/chills.  If your incision is sealed with Steri-strips or staples, you may shower 7 days after your procedure or when told by your provider. Do not remove the steri-strips or let the shower hit directly on your site. You may wash around your site with soap and water.    If you were discharged in a sling, please do not wear this during the day more than 48 hours after your surgery unless otherwise instructed. This may increase the risk of stiffness and soreness in your shoulder.   Avoid lotions, ointments, or perfumes over your incision until it is well-healed.  You may use a hot tub or a pool AFTER your wound check appointment if the incision is completely closed.  Your ICD is designed to protect you from life threatening heart rhythms. Because of this, you may receive a shock.   1 shock with no symptoms:  Call  the office during business hours. 1 shock with symptoms (chest pain, chest pressure, dizziness, lightheadedness, shortness of breath, overall feeling unwell):  Call 911. If you experience 2 or more shocks in 24 hours:  Call 911. If you receive a shock, you should not drive for 6 months per the [place] DMV IF you receive appropriate therapy from your ICD.   ICD Alerts:  Some alerts are vibratory and others beep. These are NOT emergencies. Please call our office to let us know. If this occurs at night or on weekends, it can wait until the next business day. Send a remote transmission.  If your device is capable of reading fluid status (for heart failure), you will be offered monthly monitoring to review this with you.   DEVICE MANAGEMENT Remote monitoring is used to monitor your ICD from home. This monitoring is scheduled every 91 days by our office. It allows [place] to keep an eye on the functioning of your device to ensure it is working properly. You will routinely see your Electrophysiologist annually (more often if necessary).   You should receive your ID card for your new device in 4-8 weeks. Keep this card with you at all times once received. Consider wearing a medical alert bracelet or necklace.  Your ICD  may be MRI compatible. This will be discussed at your next office visit/wound check.  You should avoid contact with strong electric or magnetic fields.   Do not  use amateur (ham) Market researcher (arc) Clinical cytogeneticist. MP3 player headphones with magnets should not be used. Some devices are safe to use if held at least 12 inches (30 cm) from your defibrillator. These include power tools, lawn mowers, and speakers. If you are unsure if something is safe to use, ask your health care provider.  When using your cell phone, hold it to the ear that is on the opposite side from the defibrillator. Do not leave your cell phone in a pocket over the defibrillator.  You may safely use electric  blankets, heating pads, computers, and microwave ovens.  Call the office right away if: You have chest pain. You feel more than one shock. You feel more short of breath than you have felt before. You feel more light-headed than you have felt before. Your incision starts to open up.  This information is not intended to replace advice given to you by your health care provider. Make sure you discuss any questions you have with your health care provider.

## 2021-09-02 NOTE — Progress Notes (Addendum)
Advanced Heart Failure Rounding Note   Subjective:    S/p dual chamber Medtronic ICD yesterday. Device check and CXR pending.   Feels "groggy" today. Otherwise no complaints. Denies dyspnea. No chest.   BMP pending.    Objective:   Weight Range:  Vital Signs:   Temp:  [98.5 F (36.9 C)-98.7 F (37.1 C)] 98.6 F (37 C) (08/24 0400) Pulse Rate:  [0-109] 70 (08/24 0630) Resp:  [14-37] 26 (08/24 0630) BP: (85-142)/(48-120) 106/70 (08/24 0630) SpO2:  [86 %-100 %] 95 % (08/24 0630) Weight:  [153.3 kg] 153.3 kg (08/24 0500) Last BM Date : 08/29/21  Weight change: Filed Weights   08/30/21 0500 08/31/21 0500 09/02/21 0500  Weight: (!) 160.2 kg (!) 160.2 kg (!) 153.3 kg    Intake/Output:   Intake/Output Summary (Last 24 hours) at 09/02/2021 0734 Last data filed at 09/02/2021 0600 Gross per 24 hour  Intake 1570.95 ml  Output 1925 ml  Net -354.05 ml     PHYSICAL EXAM: General:  obese, well appearing No respiratory difficulty HEENT: normal Neck: supple. Thick neck JVD not well visualized. Carotids 2+ bilat; no bruits. No lymphadenopathy or thyromegaly appreciated. Cor: PMI nondisplaced. Regular rate & rhythm. No rubs, gallops or murmurs. Device pocket left upper chest bandaged. No drainage  Lungs: clear Abdomen: obese, soft, nontender, nondistended. No hepatosplenomegaly. No bruits or masses. Good bowel sounds. Extremities: no cyanosis, clubbing, rash, trace b/l LE edema, b/l legs wrapped, LUE in arm sling  Neuro: alert & oriented x 3, cranial nerves grossly intact. moves all 4 extremities w/o difficulty. Affect pleasant.   Telemetry: A paced 70s   Personally reviewed  Labs: Basic Metabolic Panel: Recent Labs  Lab 08/26/21 1253 08/27/21 0633 08/28/21 0439 08/29/21 0904 08/30/21 0541 08/31/21 0100 09/01/21 0715  NA 137   < > 137 137 138 137 138  K 5.0   < > 5.6* 4.4 4.4 4.4 4.0  CL 105   < > 105 107 108 108 102  CO2 22   < > '25 23 25 23 29  '$ GLUCOSE 109*    < > 104* 110* 100* 100* 95  BUN 20   < > 25* '18 16 14 12  '$ CREATININE 1.51*   < > 1.48* 0.94 0.93 0.76 0.86  CALCIUM 8.9   < > 9.0 8.8* 8.8* 8.7* 8.9  MG 1.8  --   --   --   --   --   --   PHOS 4.1  --   --   --   --   --   --    < > = values in this interval not displayed.    Liver Function Tests: Recent Labs  Lab 08/26/21 1253  AST 21  ALT 10  ALKPHOS 120  BILITOT 1.8*  PROT 6.8  ALBUMIN 3.3*   No results for input(s): "LIPASE", "AMYLASE" in the last 168 hours. No results for input(s): "AMMONIA" in the last 168 hours.  CBC: Recent Labs  Lab 08/26/21 1253 08/27/21 1521 08/27/21 1528 08/29/21 0904 08/30/21 0541 08/31/21 0100 09/01/21 0715  WBC 6.9  --   --  6.5 6.5 6.3 6.8  NEUTROABS 5.1  --   --   --   --   --   --   HGB 13.0   < > 12.9*  13.3 12.6* 12.9* 12.7* 13.2  HCT 41.3   < > 38.0*  39.0 39.8 41.7 39.2 41.1  MCV 95.4  --   --  95.2 96.1 94.2 93.8  PLT 129*  --   --  108* 118* 115* 122*   < > = values in this interval not displayed.    Cardiac Enzymes: No results for input(s): "CKTOTAL", "CKMB", "CKMBINDEX", "TROPONINI" in the last 168 hours.  BNP: BNP (last 3 results) Recent Labs    08/02/21 1150 08/26/21 1259  BNP 475.7* 655.7*    ProBNP (last 3 results) No results for input(s): "PROBNP" in the last 8760 hours.    Other results:  Imaging: EP PPM/ICD IMPLANT  Result Date: 09/01/2021 Conclusion: Successful ICD implantation in a patient with complete heart block, chronic systolic heart failure, and chronic atrial fibrillation.  A left bundle area lead was placed in the atrial port with a very nice narrow QRS. Cristopher Peru, MD     Medications:     Scheduled Medications:  aspirin  81 mg Oral Daily   atorvastatin  40 mg Oral QHS   Chlorhexidine Gluconate Cloth  6 each Topical Daily   Chlorhexidine Gluconate Cloth  6 each Topical Q0600   dapagliflozin propanediol  10 mg Oral Daily   insulin aspart  0-15 Units Subcutaneous TID WC    levofloxacin  750 mg Oral QHS   mupirocin ointment  1 Application Nasal BID   sacubitril-valsartan  1 tablet Oral BID   sertraline  50 mg Oral Daily   sodium chloride flush  3 mL Intravenous Q12H   sodium chloride flush  3 mL Intravenous Q12H   sodium chloride flush  3 mL Intravenous Q12H   spironolactone  12.5 mg Oral Daily   torsemide  20 mg Oral Daily    Infusions:  sodium chloride     sodium chloride Stopped (09/02/21 0355)   sodium chloride 50 mL/hr at 09/02/21 0500    PRN Medications: sodium chloride, sodium chloride, acetaminophen, atropine, ondansetron (ZOFRAN) IV, mouth rinse, sodium chloride flush, sodium chloride flush   Assessment/Plan   Acute on Chronic Systolic Biventricular Heart Failure - Echo 02/2017 EF 25-30% w/ diffuse HK. RV normal. Felt to be viral CM +/- component of tachymediated in setting of influenza PNA and rapid Afib  - Echo 07/2017 EF recovered, 50-55%, RV mildly reduced - Echo 11/2018 EF 45-50%, RV mildly reduced (in rate controlled afib)  - Echo 08/23/21 EF 30-35%, severe LVH, moderately reduced RV systolic function, moderate TVR - NYHA Class III, confounded by obesity  - R/L cath 08/27/21. Diffuse non-obstructive CAD with 90% lesion in dLAD - RHC cath with biventricular failure R>L: RA 21 PA 56/22 (36) PCW 24 Fick 7.3/2.7 PaPi 2.1 - given concomitant conduction disease, profound bradycardia and LVH, ? Amyloid w/u - unable to get cMRI with TVP  - GDMT initially limited by AKI and hypotension, now resolved   - continue GDMT titration as tolerated.  - Continue Entresto 24-26 mg bid (BP too soft for titration)  - Continue Spiro 12.5 mg daily  - Continue Farxiga 10 mg daily  - Continue Torsemide 20 mg daily  - now w/ PPM, may consider  ? blocker/digoxin further down the road if needed      2. Profound Bradycardia w/ High-grade AV block - s/p placement of TVP  - off coreg - has known RBBB  - LHC w/o high grade CAD  - pacer dependent, s/p BiV ICD  -  appreciate EP's assistance   3. Chronic Afib - dates back to at least 2019, has been historically well rate controlled - has never had attempt at DCCV. Given  chronicity , tx w/ rhythm control at this point may be difficult  - size may prohibit ablation  - continue tx of OSA w/ CPAP  - Eliquis has been on hold. Completed procedures. Restart once ok by EP    4. AKI due to ATN - b/w Scr 0.9 - 1.51 on admit, >>1.68 -> 1.48 -> 0.94 ->0.76->0.86->pending  - suspect 2/2 hypotension/ bradycardia - follow BMP    5. PAD/ Chronic Venous Stasis Wounds  - s/p left transmetatarsal amputation - followed by [facility] and Wound clinic  - Seen by [place] RN. Wounds felt to be chronic 2/2  lymphedema. - Bedside RN to conduct dressing changes. Will need f/u in wound clinic post d/c  - on levofloxacin 750 mg qd    6. H/o Lymphoma - s/p R-CHOP in 2011 (potential cardiotoxic effects)    7. Type 2DM - Hgb A1c 6.0   8. CAD - diffuse mostly non-obstructive CAD on cath. Not cause of CM - medical management with ASA/statin  - If has angina can consider PCI PDA   9. Morbid obesity - needs weight loss - consider GLP1RA  10. Hypertension - Improving w/ Entresto, further titration as tolerated  - BMP pending   11. Debility/ Weakness - needs PT/OT, consult placed   Ok to d/c out of ICU today to PCU today. Consult PT/OT.  D/w  Dr. Haroldine Laws, will ask [facility] to pick up once transferred given other ongoing medical needs. We will continue to follow along.     Length of Stay: [place] PA-C  09/02/2021, 7:34 AM  Advanced Heart Failure Team Pager [phone] (M-F; 7a - 4p)  Please contact [facility] for night-coverage after hours (4p -7a ) and weekends on [email or web address]   Patient seen and examined with the above-signed Advanced Practice Provider and/or Housestaff. I personally reviewed laboratory data, imaging studies and relevant notes. I independently examined the patient and formulated the  important aspects of the plan. I have edited the note to reflect any of my changes or salient points. I have personally discussed the plan with the patient and/or family.  S/p CRT-D placement yesterday. Feels sore but ok. Has diuresed well. Denies SOB, orthopnea or PND.  Has not been out of bed for > 1 week   General:  Lying in bed. No resp difficulty HEENT: normal Neck: supple. no JVD. Carotids 2+ bilat; no bruits. No lymphadenopathy or thryomegaly appreciated. Cor: PMI nondisplaced. Regular rate & rhythm. No rubs, gallops or murmurs. Pacer site dressing ok  Lungs: clear Abdomen: obese soft, nontender, nondistended. No hepatosplenomegaly. No bruits or masses. Good bowel sounds. Extremities: no cyanosis, clubbing, rash, tr edema  Wrapped  Neuro: alert & orientedx3, cranial nerves grossly intact. moves all 4 extremities w/o difficulty. Affect pleasant  Now s/p CRT-D. Volume status improved as well. BP soft but stable.   Suspect he will significant rehab. Can transfer to floor (ask [facility] to pick up if possible).   Titrate GDMT as tolerated.  Glori Bickers, MD  10:25 AM

## 2021-09-02 NOTE — Progress Notes (Signed)
Orthopedic Tech Progress Note Patient Details:  [name] December 16, 1955 [id number]  Ortho Devices Type of Ortho Device: Haematologist Ortho Device/Splint Location: Bi LE Ortho Device/Splint Interventions: Application   Post Interventions Patient Tolerated: Well Instructions Provided: Care of device  Shaili Donalson E Courtnie Brenes 09/02/2021, 2:25 PM

## 2021-09-02 NOTE — Evaluation (Signed)
Physical Therapy Evaluation Patient Details Name: [name] MRN: [id number] DOB: Aug 26, 1955 Today's Date: 09/02/2021  History of Present Illness  66 yo male admitted 08/26/21 with bradycardia/syncope s/p biv ICD placed 09/01/21. PMhx: left TMA, HFrEF, Afib, obesity, HTN, DM, sleep apnea, gout  Clinical Impression  Pt pleasant and eager to get up and move. Pt educated for all precautions, he normally sleeps on left side of bed and educated for preference for right with precautions. PT lives alone but has friend and sister who can assist at D/C. Pt will not be allowed to push/pull for minimum of 3 weeks and relies heavily on arms for transfers. If family/friend cannot provide 3weeks of assist pt may require sNF stay until precautions cleared for mobility vs HHPT. Pt with decreased transfers, function and gait who will benefit from acute therapy  to maximize function and independence .  92% on 4L HR 70     Recommendations for follow up therapy are one component of a multi-disciplinary discharge planning process, led by the attending physician.  Recommendations may be updated based on patient status, additional functional criteria and insurance authorization.  Follow Up Recommendations Skilled nursing-short term rehab (<3 hours/day) (if family/friends unable to provide 3 weeks of assist at home with HHPT) Can patient physically be transported by private vehicle: Yes    Assistance Recommended at Discharge Frequent or constant Supervision/Assistance  Patient can return home with the following  A lot of help with walking and/or transfers;A lot of help with bathing/dressing/bathroom;Assist for transportation;Assistance with cooking/housework;Help with stairs or ramp for entrance    Equipment Recommendations None recommended by PT  Recommendations for Other Services  OT consult    Functional Status Assessment Patient has had a recent decline in their functional status and demonstrates the ability  to make significant improvements in function in a reasonable and predictable amount of time.     Precautions / Restrictions Precautions Precautions: Fall;ICD/Pacemaker Precaution Comments: bil darco shoes      Mobility  Bed Mobility Overal bed mobility: Needs Assistance Bed Mobility: Rolling, Sidelying to Sit Rolling: Min assist Sidelying to sit: Min assist, HOB elevated       General bed mobility comments: HOB 30 degrees with assist to roll to right and rise from side    Transfers Overall transfer level: Needs assistance   Transfers: Sit to/from Stand Sit to Stand: Min assist, Mod assist, From elevated surface           General transfer comment: min assist to rise from elevated bed surface, mod assist to rise from reclienr    Ambulation/Gait Ambulation/Gait assistance: Min guard Gait Distance (Feet): 80 Feet Assistive device: Rolling walker (2 wheels) Gait Pattern/deviations: Step-through pattern, Decreased stride length, Trunk flexed   Gait velocity interpretation: <1.8 ft/sec, indicate of risk for recurrent falls   General Gait Details: mod cues for proximity to RW to maintain precautions and trunk position. Pt walked 5' then 10' to bathroom after sitting  Stairs            Wheelchair Mobility    Modified Rankin (Stroke Patients Only)       Balance Overall balance assessment: Needs assistance Sitting-balance support: No upper extremity supported, Feet supported Sitting balance-Leahy Scale: Fair Sitting balance - Comments: static sitting without assist   Standing balance support: Bilateral upper extremity supported Standing balance-Leahy Scale: Poor Standing balance comment: reliant on RW in standing  Pertinent Vitals/Pain Pain Assessment Pain Assessment: No/denies pain    Home Living Family/patient expects to be discharged to:: Private residence Living Arrangements: Alone Available Help at  Discharge: Friend(s);Available 24 hours/day Type of Home: House Home Access: Stairs to enter Entrance Stairs-Rails: None Entrance Stairs-Number of Steps: 1 Alternate Level Stairs-Number of Steps: 6 steps then 6 steps Home Layout: Two level;Able to live on main level with bedroom/bathroom Home Equipment: Rolling Walker (2 wheels);Cane - single point;Wheelchair - power      Prior Function Prior Level of Function : Independent/Modified Independent                     Journalist, newspaper        Extremity/Trunk Assessment   Upper Extremity Assessment Upper Extremity Assessment: Generalized weakness    Lower Extremity Assessment Lower Extremity Assessment: Generalized weakness    Cervical / Trunk Assessment Cervical / Trunk Assessment: Kyphotic (increased body habitus)  Communication   Communication: No difficulties  Cognition Arousal/Alertness: Awake/alert Behavior During Therapy: WFL for tasks assessed/performed Overall Cognitive Status: Within Functional Limits for tasks assessed                                          General Comments      Exercises     Assessment/Plan    PT Assessment Patient needs continued PT services  PT Problem List Decreased strength;Decreased mobility;Decreased safety awareness;Decreased activity tolerance;Decreased balance;Decreased knowledge of use of DME       PT Treatment Interventions Gait training;Balance training;Stair training;Therapeutic activities;Functional mobility training;Patient/family education;DME instruction;Therapeutic exercise    PT Goals (Current goals can be found in the Care Plan section)  Acute Rehab PT Goals 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 PT Goal Formulation: With patient Time For Goal Achievement: 09/16/21 Potential to Achieve Goals: Good    Frequency Min 3X/week     Co-evaluation               AM-PAC PT "6 Clicks" Mobility  Outcome Measure Help needed  turning from your back to your side while in a flat bed without using bedrails?: A Little Help needed moving from lying on your back to sitting on the side of a flat bed without using bedrails?: A Little Help needed moving to and from a bed to a chair (including a wheelchair)?: A Lot Help needed standing up from a chair using your arms (e.g., wheelchair or bedside chair)?: A Lot Help needed to walk in hospital room?: A Little Help needed climbing 3-5 steps with a railing? : A Lot 6 Click Score: 15    End of Session Equipment Utilized During Treatment: Oxygen Activity Tolerance: Patient tolerated treatment well Patient left: with call bell/phone within reach;Other (comment) (in bathroom with friend present and RN aware) Nurse Communication: Mobility status;Precautions PT Visit Diagnosis: Other abnormalities of gait and mobility (R26.89);Difficulty in walking, not elsewhere classified (R26.2);Muscle weakness (generalized) (M62.81)    Time: 9163-8466 PT Time Calculation (min) (ACUTE ONLY): 36 min   Charges:   PT Evaluation $PT Eval Moderate Complexity: 1 Mod PT Treatments $Gait Training: 8-22 mins        Bayard Males, PT Acute Rehabilitation Services Office: [phone]   Sandy Salaam Ambera Fedele 09/02/2021, 2:21 PM

## 2021-09-03 ENCOUNTER — Other Ambulatory Visit (HOSPITAL_COMMUNITY): Payer: Self-pay

## 2021-09-03 ENCOUNTER — Ambulatory Visit (HOSPITAL_BASED_OUTPATIENT_CLINIC_OR_DEPARTMENT_OTHER): Payer: Medicare Other | Admitting: Internal Medicine

## 2021-09-03 ENCOUNTER — Encounter: Payer: Self-pay | Admitting: Internal Medicine

## 2021-09-03 DIAGNOSIS — C8590 Non-Hodgkin lymphoma, unspecified, unspecified site: Secondary | ICD-10-CM | POA: Diagnosis present

## 2021-09-03 DIAGNOSIS — N179 Acute kidney failure, unspecified: Secondary | ICD-10-CM | POA: Diagnosis present

## 2021-09-03 DIAGNOSIS — I5023 Acute on chronic systolic (congestive) heart failure: Secondary | ICD-10-CM | POA: Diagnosis not present

## 2021-09-03 DIAGNOSIS — I482 Chronic atrial fibrillation, unspecified: Secondary | ICD-10-CM | POA: Diagnosis not present

## 2021-09-03 DIAGNOSIS — E785 Hyperlipidemia, unspecified: Secondary | ICD-10-CM

## 2021-09-03 DIAGNOSIS — I442 Atrioventricular block, complete: Secondary | ICD-10-CM | POA: Diagnosis present

## 2021-09-03 DIAGNOSIS — E1169 Type 2 diabetes mellitus with other specified complication: Secondary | ICD-10-CM | POA: Diagnosis present

## 2021-09-03 DIAGNOSIS — I2510 Atherosclerotic heart disease of native coronary artery without angina pectoris: Secondary | ICD-10-CM | POA: Diagnosis present

## 2021-09-03 LAB — CBC
HCT: 38.1 % — ABNORMAL LOW (ref 39.0–52.0)
Hemoglobin: 12.4 g/dL — ABNORMAL LOW (ref 13.0–17.0)
MCH: 30.5 pg (ref 26.0–34.0)
MCHC: 32.5 g/dL (ref 30.0–36.0)
MCV: 93.6 fL (ref 80.0–100.0)
Platelets: 113 10*3/uL — ABNORMAL LOW (ref 150–400)
RBC: 4.07 MIL/uL — ABNORMAL LOW (ref 4.22–5.81)
RDW: 17.1 % — ABNORMAL HIGH (ref 11.5–15.5)
WBC: 7.2 10*3/uL (ref 4.0–10.5)
nRBC: 0 % (ref 0.0–0.2)

## 2021-09-03 LAB — BASIC METABOLIC PANEL
Anion gap: 1 — ABNORMAL LOW (ref 5–15)
BUN: 10 mg/dL (ref 8–23)
CO2: 31 mmol/L (ref 22–32)
Calcium: 8.1 mg/dL — ABNORMAL LOW (ref 8.9–10.3)
Chloride: 104 mmol/L (ref 98–111)
Creatinine, Ser: 0.68 mg/dL (ref 0.61–1.24)
GFR, Estimated: >60 mL/min (ref >60)
Glucose, Bld: 106 mg/dL — ABNORMAL HIGH (ref 70–99)
Potassium: 3.5 mmol/L (ref 3.5–5.1)
Sodium: 136 mmol/L (ref 135–145)

## 2021-09-03 LAB — GLUCOSE, CAPILLARY
Glucose-Capillary: 96 mg/dL (ref 70–99)
Glucose-Capillary: 77 mg/dL (ref 70–99)
Glucose-Capillary: 111 mg/dL — ABNORMAL HIGH (ref 70–99)
Glucose-Capillary: 119 mg/dL — ABNORMAL HIGH (ref 70–99)

## 2021-09-03 LAB — MAGNESIUM: Magnesium: 2 mg/dL (ref 1.7–2.4)

## 2021-09-03 MED ORDER — SPIRONOLACTONE 12.5 MG HALF TABLET
12.5000 mg | ORAL_TABLET | Freq: Once | ORAL | Status: AC
  Administered 2021-09-03: 12.5 mg via ORAL
  Filled 2021-09-03: qty 1

## 2021-09-03 MED ORDER — ASPIRIN 81 MG PO CHEW
81.0000 mg | CHEWABLE_TABLET | Freq: Every day | ORAL | 11 refills | Status: DC
  Filled 2021-09-03: qty 30, 30d supply, fill #0

## 2021-09-03 MED ORDER — TORSEMIDE 20 MG PO TABS
20.0000 mg | ORAL_TABLET | Freq: Every day | ORAL | 6 refills | Status: DC
  Filled 2021-09-03: qty 30, 30d supply, fill #0

## 2021-09-03 MED ORDER — ENTRESTO 24-26 MG PO TABS
1.0000 | ORAL_TABLET | Freq: Two times a day (BID) | ORAL | 6 refills | Status: DC
  Filled 2021-09-03: qty 60, 30d supply, fill #0

## 2021-09-03 MED ORDER — SPIRONOLACTONE 25 MG PO TABS: 25.0000 mg | ORAL_TABLET | Freq: Every day | ORAL | Status: DC

## 2021-09-03 MED ORDER — SPIRONOLACTONE 25 MG PO TABS
25.0000 mg | ORAL_TABLET | Freq: Every day | ORAL | 6 refills | Status: DC
  Filled 2021-09-03: qty 30, 30d supply, fill #0

## 2021-09-03 MED ORDER — ATORVASTATIN CALCIUM 40 MG PO TABS
ORAL_TABLET | ORAL | 11 refills | Status: DC
  Filled 2021-09-03: qty 30, 30d supply, fill #0

## 2021-09-03 MED ORDER — DAPAGLIFLOZIN PROPANEDIOL 10 MG PO TABS
10.0000 mg | ORAL_TABLET | Freq: Every day | ORAL | 11 refills | Status: DC
  Filled 2021-09-03: qty 30, 30d supply, fill #0

## 2021-09-03 NOTE — Assessment & Plan Note (Signed)
Patient with rate control atrial fibrillation, now has a pacer implanted. Continue anticoagulation with apixaban. To be resumed on 09/05/21.

## 2021-09-03 NOTE — Progress Notes (Signed)
Orthopedic Tech Progress Note Patient Details:  [name] June 11, 1955 [id number]  Ortho Devices Type of Ortho Device: Haematologist Ortho Device/Splint Location: RLE Ortho Device/Splint Interventions: Application, Ordered, Adjustment   Post Interventions Patient Tolerated: Well Instructions Provided: Care of device New unna boot applied to the RLE due to wound drainage. [name] 09/03/2021, 7:11 PM

## 2021-09-03 NOTE — Assessment & Plan Note (Signed)
Patient had chemotherapy in 2011.

## 2021-09-03 NOTE — Assessment & Plan Note (Signed)
Patient was admitted to the intensive care unit.  B blocker was discontinued.  He had persistent bradycardia and complete heart block.  Requires transvenous pace maker and then had a permanent Bi ventricular pacer implanted with ICD. Patient with prolonged qrs and reduced LV systolic function.  Plan to follow up with EP as outpatient.

## 2021-09-03 NOTE — Assessment & Plan Note (Signed)
Calculated BMI is 46,4

## 2021-09-03 NOTE — TOC CM/SW Note (Signed)
SATURATION QUALIFICATIONS: (This note is used to comply with regulatory documentation for home oxygen)  Patient Saturations on Room Air at Rest = 91%  Patient Saturations on Room Air while Ambulating = 88%  Patient Saturations on 2 Liters of oxygen while Ambulating = 93%  Please briefly explain why patient needs home oxygen: CHF

## 2021-09-03 NOTE — Assessment & Plan Note (Signed)
Patient was placed on insulin therapy for glucose cover and monitoring. Continue close follow up as outpatient.

## 2021-09-03 NOTE — Assessment & Plan Note (Signed)
Patient had aggressive diuresis during his hospitalization. At the time of his discharge his serum cr is 0,68 with K at 3,5 and serum bicarbonate at 31. Plan to continue diuretic therapy and follow up renal function as outpatient.

## 2021-09-03 NOTE — TOC Initial Note (Signed)
Transition of Care [facility]) - Initial/Assessment Note    Patient Details  Name: [name] MRN: [id number] Date of Birth: 28-Mar-1955  [facility] of [facility]) CM/SW Contact:    Erenest Rasher, RN Phone Number: [phone] 09/03/2021, 4:33 PM  Clinical Narrative:                 HF TOC CM spoke to pt and offered choice for [facility] (medicare.gov list with ratings placed on chart) Pt states he had [place] in the past. Contacted [facility] ([place], Caryl Pina) with new referral. Pt has a scale at home. [place] for delivery of oxygen to room.   Expected Discharge Plan: [place] Barriers to Discharge: No Barriers Identified   Patient Goals and CMS Choice Patient states their goals for this hospitalization and ongoing recovery are:: wants to remain independent CMS Medicare.gov Compare Post Acute Care list provided to:: Patient Choice offered to / list presented to : Patient  Expected Discharge Plan and Services Expected Discharge Plan: [place] In-house Referral: Clinical Social Work Discharge Planning Services: CM Consult Post Acute Care Choice: [place] arrangements for the past 2 months: Single Family Home                 DME Arranged: Oxygen DME Agency: [employer] Date DME Agency Contacted: 09/03/21 Time DME Agency Contacted: [phone] Representative spoke with at DME Agency: Andree Coss HH Arranged: RN, PT [place] Agency: [place] ([place]) Date [place]: 09/03/21 Time [place]: 1631    Prior Living Arrangements/Services Living arrangements for the past 2 months: [place] with:: Self          Need for Family Participation in Patient Care: No (Comment) Care giver support system in place?: Yes (comment) Current home services: DME (rolling walker, cane, CPAP, electric wheelchair) Criminal Activity/Legal Involvement Pertinent to  Current Situation/Hospitalization: No - Comment as needed  Activities of Daily Living Home Assistive Devices/Equipment: None ADL Screening (condition at time of admission) Patient's cognitive ability adequate to safely complete daily activities?: Yes Is the patient deaf or have difficulty hearing?: No Does the patient have difficulty seeing, even when wearing glasses/contacts?: No Does the patient have difficulty concentrating, remembering, or making decisions?: No Patient able to express need for assistance with ADLs?: Yes Does the patient have difficulty dressing or bathing?: Yes Independently performs ADLs?: No Does the patient have difficulty walking or climbing stairs?: Yes Weakness of Legs: Both Weakness of Arms/Hands: Both  Permission Sought/Granted Permission sought to share information with : Case Manager, PCP, Family Supports Permission granted to share information with : Yes, Verbal Permission Granted  Share Information with NAME: Margaretmary Eddy     Permission granted to share info w Relationship: sister  Permission granted to share info w Contact Information: [phone]  Emotional Assessment   Attitude/Demeanor/Rapport: Engaged Affect (typically observed): Pleasant, Accepting Orientation: : Oriented to Self, Oriented to Place, Oriented to  Time, Oriented to Situation   Psych Involvement: No (comment)  Admission diagnosis:  Bradycardia [E33.2] Systolic and diastolic CHF, chronic ([place]) [I50.42] Acute on chronic systolic (congestive) heart failure ([place]) [I50.23] Patient Active Problem List   Diagnosis Date Noted   Complete heart block ([place]) 09/03/2021   Chronic atrial fibrillation with RVR ([place]) 09/03/2021   AKI (acute kidney injury) ([place]) 09/03/2021   Coronary artery disease 09/03/2021   Lymphoma ([place]) 09/03/2021   Type 2  diabetes mellitus with hyperlipidemia ([place]) 09/03/2021   Class 3 obesity ([place]) 09/03/2021   Acute on chronic systolic (congestive) heart  failure ([place]) 08/26/2021   Type 2 diabetes mellitus with peripheral angiopathy ([place]) 03/08/2021   Hypercoagulable state ([place]) 03/08/2021   Traumatic amputation of toe or toes without complication ([place]) 03/83/3383   Hyponatremia    Chronic systolic congestive heart failure ([place])    Prediabetes    Essential hypertension    Postoperative pain    Toe ulcer, right ([place]) 08/21/2020   Status post transmetatarsal amputation of left foot ([place]) 08/18/2020   PAD (peripheral artery disease) ([place]) 08/15/2020   S/P transmetatarsal amputation of foot, right ([place]) 08/11/2020   Gangrene ([place]) 08/04/2020   Permanent atrial fibrillation ([place]) 29/19/1660   Chronic systolic HF (heart failure) ([place]) 07/21/2019   Cough 07/12/2019   Educated about COVID-19 virus infection 05/12/2019   Non-pressure chronic ulcer of other part of left foot with unspecified severity ([place]) 04/11/2019   Pain in limb 04/11/2019   Adverse effect of other vaccines and biological substances, initial encounter 04/10/2019   COVID-19 virus infection 08/07/2018   Recurrent pneumonia 02/26/2018   Port-A-Cath in place 01/08/2018   History of colonic polyps 12/11/2017   Thrombophilia ([place]) 10/30/2017   Heart failure ([place]) 07/04/2017   Dyspnea on exertion 06/19/2017   Chronic anticoagulation 05/22/2017   Cardiomyopathy ([place]) 05/22/2017   Hypotension 05/22/2017   RBBB 05/22/2017   UTI due to extended-spectrum beta lactamase (ESBL) producing Escherichia coli 04/06/2017   Debility    Wound infection    OSA (obstructive sleep apnea)    Acute systolic heart failure (HCC)    Morbid obesity ([place])    Diabetes mellitus, new onset ([place])    Dysuria    Acute respiratory distress syndrome (ARDS) (HCC)    Influenza A with pneumonia    Anxiety    Current moderate episode of major depressive disorder without prior episode ([place])    Obesity, Class III, BMI 40-49.9 (morbid obesity) ([place])    Community acquired pneumonia    Critical illness  myopathy    Dysphagia    Non-Hodgkin's lymphoma ([place])    Leukocytosis    Hypokalemia    Tracheostomy status ([place])    Hypoxic    Pressure injury of skin 02/28/2017   Status post tracheostomy ([place])    Follicular lymphoma grade II of intrathoracic lymph nodes ([place]) 05/02/2016   Portacath in place 05/04/2015   Localized, primary osteoarthritis of hand 09/08/2014   Atherosclerotic heart disease of native coronary artery without angina pectoris 08/26/2013   Tobacco user 07/30/2012   Gout attack 01/16/2012   Psoriasis 60/04/5995   Follicular lymphoma grade II ([place]) 12/16/2010   Personal history of non-Hodgkin lymphomas 09/20/2010   Peripheral venous insufficiency 11/16/2009   Encounter for general adult medical examination without abnormal findings 06/16/2009   Hyperlipidemia 06/16/2009   Impaired fasting glucose 06/16/2009   Proteinuria 06/16/2009   Varicose veins of other specified sites 06/16/2009   PCP:  Ginger Organ., MD Pharmacy:   [id number] [place], [place] - [address]Phone: [phone]: [id number]: [phone]: [phone]    Social Determinants of Health (SDOH) Interventions    Readmission Risk Interventions     No data to display

## 2021-09-03 NOTE — Discharge Summary (Signed)
Physician Discharge Summary   Patient: [name] MRN: [id number] DOB: 1955-05-17  Admit date:     08/26/2021  Discharge date: 09/04/21  Discharge Physician: [name]   PCP: [name] Organ., MD   Recommendations at discharge:    Patient will continue wound care follow up at the wound care clinic, continue with oral antibiotic therapy with levofloxacin.  Resume anticoagulation on 09/05/21. Follow up with Dr [name] in 7 to 10 days Follow up with cardiology as scheduled.    Discharge Diagnoses: Principal Problem:   Complete heart block (HCC) Active Problems:   Acute on chronic systolic (congestive) heart failure (HCC)   Chronic atrial fibrillation with RVR (HCC)   Coronary artery disease   AKI (acute kidney injury) ([place])   Type 2 diabetes mellitus with hyperlipidemia (HCC)   Lymphoma (HCC)   Class 3 obesity ([place])  Resolved Problems:   * No resolved hospital problems. [facility] Course: [name] was admitted to the hospital with the working diagnosis of decompensated heart failure.  66 yo male with the past medical history of heart failure, permanent atrial fibrillation, hypertension, OSA, T2DM how presented with bradycardia and near syncope episode. Patient had a near syncope episode at home after he got out of the shower, later during the day at work he had another event of near syncope episode while seating. EMS was called and patient was brought to the hospital. On his initial physical examination his blood pressure was 72/58, HR 47-36, rr 18 and 02 saturation 92%,heart with S1 and S2 present and bradycardic, regular in rhythm, lungs with no wheezing or rhonchi, abdomen not distended and trace lower extremity edema. Had bilateral wraps lower extremities.   Na 137, K 5.0 CL 105 bicarbonate 22, glucose 109, bun 20 and cr at 1,51 BNP 655 Wbc 6,9 hgb 13.0 plt 129  TSH 5,8   Chest radiograph with cardiomegaly with no infiltrates or effusions   EKG 28 bpm,  right axis, right bundle branch block, atrial fibrillation rhythm with no ST segment or T wave changes   Patient had worsening bradycardia and hypotension. Decision was made to place a temporary transvenous pacer placed and he was admitted to the intensive care unit.   Echocardiogram with reduced LV systolic function 30 to 99%.   08/18 cardiac catheterization showed widespread coronary atherosclerosis, 90% lesion in the PDA, hemodynamics with biventricular failure worse on the left.    Despite holding B blockade patient continue with bradycardia.  Patient with complete heart block with no recurrence of AV conduction.  09/01/21 BiV ICD implanted.   Patient was placed on goal directed therapy for heart failure and will follow up as outpatient.    Assessment and Plan: * Complete heart block [facility]) Patient was admitted to the intensive care unit.  B blocker was discontinued.  He had persistent bradycardia and complete heart block.  Requires transvenous pace maker and then had a permanent Bi ventricular pacer implanted with ICD. Patient with prolonged qrs and reduced LV systolic function.  Plan to follow up with EP as outpatient.   Acute on chronic systolic (congestive) heart failure (HCC) Echocardiogram with reduced LV systolic function with EF 30 to 35%, global hypokinesis. Moderate reduction in RV systolic function. RVSP 44,7. Moderate TR.  08/18 cardiac catheterization RV 57/19 PA 56/22 mean 36 PCWP 24. PVR 1,8 Cardiac output 7.3 (fick) Cardiac index 2,7 (fick)  Pre capillary pulmonary hypertension.   Patient had diuresis with good response, at the  time of his discharge, volume balance was negative at -11,413 ml.   Acute decompensation of heart failure.  Patient was placed on entresto, spironolactone, empagliflozin and torsemide.  Holding b blockade for now, possible starting as outpatient.   Essential hypertension, continue blood pressure control with entresto.     Chronic atrial fibrillation with RVR ([place]) Patient with rate control atrial fibrillation, now has a pacer implanted. Continue anticoagulation with apixaban. To be resumed on 09/05/21.    Coronary artery disease Positive coronary artery disease, with no significant critical stenosis. Plan to continue medical therapy.   Peripheral vascular disease. Positive wound on right heal. Patient will continue antibiotic therapy and wound care as outpatient with wound care clinic.   Type 2 diabetes mellitus with hyperlipidemia ([place]) Patient was placed on insulin therapy for glucose cover and monitoring. Continue close follow up as outpatient.   AKI (acute kidney injury) [facility]) Patient had aggressive diuresis during his hospitalization. At the time of his discharge his serum cr is 0,68 with K at 3,5 and serum bicarbonate at 31. Plan to continue diuretic therapy and follow up renal function as outpatient.   Lymphoma [name]) Patient had chemotherapy in 2011.   Class 3 obesity (HCC) Calculated BMI is 46,4          Consultants: cardiology and EP Procedures performed: pacemaker transvenous, pacemaker biventricular and defibrillator   Disposition: Home Diet recommendation:  Cardiac diet DISCHARGE MEDICATION: Allergies as of 09/04/2021       Reactions   Latex Hives, Swelling   Ace Inhibitors    Other reaction(s): cough   Penicillin G    Other reaction(s): rash Has tolerated amoxicillin        Medication List     STOP taking these medications    Klor-Con M20 20 MEQ tablet Generic drug: potassium chloride SA   losartan 50 MG tablet Commonly known as: COZAAR       TAKE these medications    acetaminophen 325 MG tablet Commonly known as: TYLENOL Take 650 mg by mouth every 6 (six) hours as needed for moderate pain.   albuterol 108 (90 Base) MCG/ACT inhaler Commonly known as: VENTOLIN HFA Inhale 2 puffs into the lungs every 6 (six) hours as needed for wheezing or  shortness of breath.   ascorbic acid 500 MG tablet Commonly known as: VITAMIN C Take 1 tablet (500 mg total) by mouth 3 (three) times daily. What changed: when to take this   Aspirin Low Dose 81 MG chewable tablet Generic drug: aspirin Chew 1 tablet (81 mg total) by mouth daily.   atorvastatin 40 MG tablet Commonly known as: LIPITOR TAKE 1 TABLET BY MOUTH EVERY DAY *PT OVERDUE FOR OFFICE VISIT* What changed: medication strength   cholecalciferol 25 MCG (1000 UNIT) tablet Commonly known as: VITAMIN D3 Take 1 tablet (1,000 Units total) by mouth daily.   colchicine 0.6 MG tablet Take 0.6 mg by mouth 2 (two) times daily as needed.   cyanocobalamin 1000 MCG tablet Commonly known as: VITAMIN B12 Take 1 tablet (1,000 mcg total) by mouth daily.   diclofenac Sodium 1 % Gel Commonly known as: VOLTAREN Apply 2 g topically 4 (four) times daily for 7 days.   Eliquis 5 MG Tabs tablet Generic drug: apixaban TAKE 1 TABLET BY MOUTH TWICE A DAY   Entresto 24-26 MG Generic drug: sacubitril-valsartan Take 1 tablet by mouth 2 (two) times daily.   Farxiga 10 MG Tabs tablet Generic drug: dapagliflozin propanediol Take 1 tablet (10 mg total)  by mouth daily.   FreeStyle Libre 2 Reader [place] 1 each by Does not apply route daily.   FreeStyle Libre 2 Sensor Misc 1 each by Does not apply route daily.   levofloxacin 750 MG tablet Commonly known as: LEVAQUIN Take 750 mg by mouth daily.   MOUNJARO Howe Inject 1 Dose into the skin once a week.   Omega 3 340 MG Cpdr Take 1 capsule (340 mg total) by mouth 2 (two) times daily. What changed: when to take this   omeprazole 40 MG capsule Commonly known as: PRILOSEC Take 1 capsule (40 mg total) by mouth daily.   polycarbophil 625 MG tablet Commonly known as: FIBERCON Take 1 tablet (625 mg total) by mouth daily.   Santyl 250 UNIT/GM ointment Generic drug: collagenase APPLY 1 APPLICATION. TOPICALLY DAILY.   sertraline 50 MG  tablet Commonly known as: ZOLOFT Take 1 tablet (50 mg total) by mouth daily.   spironolactone 25 MG tablet Commonly known as: ALDACTONE Take 1 tablet (25 mg total) by mouth daily.   torsemide 20 MG tablet Commonly known as: DEMADEX Take 1 tablet (20 mg total) by mouth daily. What changed:  how much to take when to take this               Durable Medical Equipment  (From admission, onward)           Start     Ordered   09/03/21 1510  For home use only DME oxygen  Once       Comments: POC evaluation  Question Answer Comment  Length of Need Lifetime   Mode or (Route) Nasal cannula   Liters per Minute 2   Frequency Continuous (stationary and portable oxygen unit needed)   Oxygen delivery system Gas      09/03/21 1511            Follow-up Information     [facility] Follow up.   Why: on 9/6 at [facility] for post defibrillator check Contact information: [place] [address] [phone]        [place] Follow up on 09/20/2021.   Specialty: Cardiology Why: [facility] 9:30 am Entrance C, Free Valet Parking Contact information: [address] [phone] mc [address] [place]        [name] Organ., MD Follow up.   Specialty: Internal Medicine Why: The office will call patient. Contact information: [address] [place] [address] [phone]         Lorretta Harp, MD .   Specialties: Cardiology, Radiology Contact information: [address] [place] [address] [phone]                Discharge Exam: Danley Danker Weights   09/02/21 0500 09/03/21 0500 09/03/21 1237  Weight: (!) 153.3 kg (!) 149.5 kg (!) 150.9 kg   BP 129/62 (BP Location: Left Arm)   [name] 70   Temp 97.6 F (36.4 C) (Oral)   Resp 20   Ht '5\' 11"'$   (1.803 m)   Wt (!) 150.9 kg   SpO2 97%   BMI 46.40 kg/m   Patient is feeling well, no dyspnea or chest pain  Neurology awake and alert ENT with mild pallor Cardiovascular with S1 and S2 present and rhythmic Respiratory with no rales or rhonchi Abdomen not distended Right lower extremity with dressing in place, positive  heal ulcerated wound Left foot with transmetatarsal amputation  Condition at discharge: stable  The results of significant diagnostics from this hospitalization (including imaging, microbiology, ancillary and laboratory) are listed below for reference.   Imaging Studies: DG Chest 2 View  Result Date: 09/02/2021 CLINICAL DATA:  Cardiac implant. EXAM: CHEST - 2 VIEW COMPARISON:  Chest x-ray 08/27/2021. FINDINGS: Streaky right midlung and left basilar opacities. Low lung volumes. Similar enlarged cardiac silhouette and pulmonary vascular congestion without overt pulmonary edema. No visible pneumothorax. Left subclavian approach cardiac rhythm maintenance device. IMPRESSION: 1. Similar cardiomegaly and pulmonary vascular congestion without overt pulmonary edema. 2. Streaky right midlung and left basilar opacities, probably atelectasis/scar. Electronically Signed   By: Margaretha Sheffield M.D.   On: 09/02/2021 13:24   EP PPM/ICD IMPLANT  Result Date: 09/01/2021 Conclusion: Successful ICD implantation in a patient with complete heart block, chronic systolic heart failure, and chronic atrial fibrillation.  A left bundle area lead was placed in the atrial port with a very nice narrow QRS. Cristopher Peru, MD   CARDIAC CATHETERIZATION  Result Date: 08/27/2021   Mid RCA lesion is 40% stenosed.   RPDA lesion is 95% stenosed.   Ost LM lesion is 30% stenosed.   Mid LAD lesion is 30% stenosed.   2nd Diag lesion is 50% stenosed.   Mid Cx lesion is 30% stenosed. Findings: Ao = 109/58 (78) LV = 119/19 RA =  21 RV = 57/19 PA = 56/22 (36) PCW = 24 (v = 30) Fick cardiac output/index = 7.3/2.7  PVR = 1.8 WU Ao sat = 99% PA sat = 68%, 69% PAPi = 2.1 Assessment: 1. Mostly non-obstructive CAD with high-grade lesion in mid to distal RPDA 2. Nonischemic CM 3. Mild to moderate pulmonary venous HTN with evidence of RV dysfunction Plan/Discussion: Continue medical therapy. If patient develops angina can consider PCI of PDA; otherwise medical therapy. Glori Bickers, MD 4:34 PM  DG CHEST PORT 1 VIEW  Result Date: 08/27/2021 CLINICAL DATA:  Heart block, temporary pacemaker EXAM: PORTABLE CHEST 1 VIEW COMPARISON:  08/26/2021 FINDINGS: Single frontal view of the chest demonstrates right internal jugular transvenous pacer, tip overlying region of the right ventricle. Cardiac silhouette is enlarged but stable. Continued central vascular congestion, without focal consolidation, effusion, or pneumothorax. External defibrillator pads overlie the left chest. IMPRESSION: 1. Transvenous pacer via right internal jugular approach tip overlying the right ventricle. 2. Continued central vascular congestion without overt edema. Electronically Signed   By: Randa Ngo M.D.   On: 08/27/2021 08:20   CARDIAC CATHETERIZATION  Result Date: 08/27/2021 1.  Successful temporary pacemaker placement with a backup rate of 50 bpm, an output of 10 mA, and a threshold of 0.6 mA. 2.  Successful right radial arterial line. Recommendation: Continued observation in the 2 Heart unit and EP consultation in the morning.   DG CHEST PORT 1 VIEW  Result Date: 08/26/2021 CLINICAL DATA:  Near syncopal episode.  CHF. EXAM: PORTABLE CHEST 1 VIEW COMPARISON:  07/22/2020 FINDINGS: Cardiac pads overlying the left side of the chest. Low lung volumes. Heart is enlarged for size. Again noted are prominent central vascular markings. Slightly prominent interstitial markings in right suprahilar region appear chronic. Negative for a pneumothorax. IMPRESSION: 1. Cardiomegaly with low lung volumes. 2. Prominent central vascular markings without overt  pulmonary edema. Electronically Signed   By: Markus Daft M.D.   On: 08/26/2021 14:49   ECHOCARDIOGRAM COMPLETE  Result Date: 08/23/2021    ECHOCARDIOGRAM REPORT   Patient Name:  [facility] D Plemons Date of Exam: 08/23/2021 Medical Rec #:  [id number]    Height:       71.0 in Accession #:    [id number]   Weight:       353.0 lb Date of Birth:  04/02/1955    BSA:          2.684 m Patient Age:    87 years     BP:           110/65 mmHg Patient Gender: M            HR:           79 bpm. Exam Location:  Outpatient Procedure: 2D Echo, 3D Echo, Intracardiac Opacification Agent, Color Doppler and            Cardiac Doppler Indications:    heart failure with reduced ejection fraction  History:        Patient has prior history of Echocardiogram examinations, most                 recent 12/10/2018. Arrythmias:Atrial Fibrillation,                 Signs/Symptoms:Dyspnea; Risk Factors:Former Smoker,                 Dyslipidemia, Hypertension and Diabetes. Covid 19; RBBB.  Sonographer:    Leavy Cella RDCS Referring Phys: [id number] [place]  Sonographer Comments: Image acquisition challenging due to patient body habitus. Pre Blood Pressure 110/65 Post Definity blood pressure 120/78 IMPRESSIONS  1. AFIB with occasional pauses noted. Left ventricular ejection fraction, by estimation, is 30 to 35%. The left ventricle has moderately decreased function. The left ventricle demonstrates global hypokinesis. There is severe left ventricular hypertrophy. Left ventricular diastolic parameters were normal.  2. Right ventricular systolic function is moderately reduced. The right ventricular size is moderately enlarged. There is mildly elevated pulmonary artery systolic pressure. The estimated right ventricular systolic pressure is 59.1 mmHg.  3. Left atrial size was mildly dilated.  4. Right atrial size was mildly dilated.  5. The mitral valve is normal in structure. Mild mitral valve regurgitation. No evidence of mitral stenosis.  6.  Tricuspid valve regurgitation is moderate.  7. The aortic valve is tricuspid. There is mild calcification of the aortic valve. There is mild thickening of the aortic valve. Aortic valve regurgitation is not visualized. Aortic valve sclerosis is present, with no evidence of aortic valve stenosis.  8. The inferior vena cava is dilated in size with >50% respiratory variability, suggesting right atrial pressure of 8 mmHg. Comparison(s): Prior images reviewed side by side. FINDINGS  Left Ventricle: AFIB with occasional pauses noted. Left ventricular ejection fraction, by estimation, is 30 to 35%. The left ventricle has moderately decreased function. The left ventricle demonstrates global hypokinesis. Definity contrast agent was given IV to delineate the left ventricular endocardial borders. The left ventricular internal cavity size was normal in size. There is severe left ventricular hypertrophy. Left ventricular diastolic parameters were normal. Right Ventricle: The right ventricular size is moderately enlarged. No increase in right ventricular wall thickness. Right ventricular systolic function is moderately reduced. There is mildly elevated pulmonary artery systolic pressure. The tricuspid regurgitant velocity is 3.03 m/s, and with an assumed right atrial pressure of 8 mmHg, the estimated right ventricular systolic pressure is 63.8 mmHg. Left Atrium: Left atrial size was mildly dilated. Right Atrium: Right atrial size was mildly dilated. Pericardium: There is no evidence of pericardial effusion. Mitral  Valve: The mitral valve is normal in structure. Mild mitral valve regurgitation. No evidence of mitral valve stenosis. Tricuspid Valve: The tricuspid valve is normal in structure. Tricuspid valve regurgitation is moderate . No evidence of tricuspid stenosis. Aortic Valve: The aortic valve is tricuspid. There is mild calcification of the aortic valve. There is mild thickening of the aortic valve. Aortic valve  regurgitation is not visualized. Aortic valve sclerosis is present, with no evidence of aortic valve stenosis. Pulmonic Valve: The pulmonic valve was normal in structure. Pulmonic valve regurgitation is not visualized. No evidence of pulmonic stenosis. Aorta: The aortic root is normal in size and structure. Venous: The inferior vena cava is dilated in size with greater than 50% respiratory variability, suggesting right atrial pressure of 8 mmHg. IAS/Shunts: No atrial level shunt detected by color flow Doppler.  LEFT VENTRICLE PLAX 2D LVIDd:         5.23 cm   Diastology LVIDs:         4.21 cm   LV e' medial:    6.64 cm/s LV PW:         2.04 cm   LV E/e' medial:  17.8 LV IVS:        1.62 cm   LV e' lateral:   8.81 cm/s LVOT diam:     2.20 cm   LV E/e' lateral: 13.4 LV SV:         40 LV SV Index:   15 LVOT Area:     3.80 cm  RIGHT VENTRICLE RV S prime:     7.94 cm/s TAPSE (M-mode): 1.3 cm LEFT ATRIUM             Index LA diam:        4.40 cm 1.64 cm/m LA Vol (A2C):   68.0 ml 25.33 ml/m LA Vol (A4C):   93.0 ml 34.64 ml/m LA Biplane Vol: 85.9 ml 32.00 ml/m  AORTIC VALVE LVOT Vmax:   50.50 cm/s LVOT Vmean:  31.600 cm/s LVOT VTI:    0.106 m  AORTA Ao Root diam: 2.80 cm Ao Asc diam:  3.60 cm MITRAL VALVE                TRICUSPID VALVE MV Area (PHT): 4.33 cm     TR Peak grad:   36.7 mmHg MV Decel Time: 175 msec     TR Vmax:        303.00 cm/s MV E velocity: 118.00 cm/s MV A velocity: 40.90 cm/s   SHUNTS MV E/A ratio:  2.89         Systemic VTI:  0.11 m                             Systemic Diam: 2.20 cm Candee Furbish MD Electronically signed by Candee Furbish MD Signature Date/Time: 08/23/2021/4:36:37 PM    Final     Microbiology: Results for orders placed or performed during the hospital encounter of 08/26/21  MRSA Next Gen by PCR, Nasal     Status: Abnormal   Collection Time: 08/27/21 11:07 AM   Specimen: Nasal Mucosa; Nasal Swab  Result Value Ref Range Status   MRSA by PCR Next Gen DETECTED (A) NOT DETECTED Final     Comment: RESULT CALLED TO, READ BACK BY AND VERIFIED WITH: Peyton Najjar, RN [phone] AT 6811 BY EC (NOTE) The GeneXpert MRSA Assay (FDA approved for NASAL specimens only), is one component of a comprehensive MRSA  colonization surveillance program. It is not intended to diagnose MRSA infection nor to guide or monitor treatment for MRSA infections. Test performance is not FDA approved in patients less than 77 years old. Performed at [facility], [place] [address] [place], [place] [address]   Surgical PCR screen     Status: Abnormal   Collection Time: 08/30/21  4:03 AM   Specimen: Nasal Mucosa; Nasal Swab  Result Value Ref Range Status   MRSA, PCR POSITIVE (A) NEGATIVE Final    Comment: RESULT CALLED TO, READ BACK BY AND VERIFIED WITH: F STIBLIN,RN'@0538'$  08/30/21 [name]    Staphylococcus aureus POSITIVE (A) NEGATIVE Final    Comment: (NOTE) The Xpert SA Assay (FDA approved for NASAL specimens in patients 38 years of age and older), is one component of a comprehensive surveillance program. It is not intended to diagnose infection nor to guide or monitor treatment. Performed at [facility], [place] [address], [place], [place] [address]     Labs: CBC: Recent Labs  Lab 08/30/21 0541 08/31/21 0100 09/01/21 0715 09/02/21 0617 09/03/21 0646  WBC 6.5 6.3 6.8 6.9 7.2  HGB 12.9* 12.7* 13.2 12.4* 12.4*  HCT 41.7 39.2 41.1 38.9* 38.1*  MCV 96.1 94.2 93.8 94.9 93.6  PLT 118* 115* 122* 114* 041*   Basic Metabolic Panel: Recent Labs  Lab 08/30/21 0541 08/31/21 0100 09/01/21 0715 09/02/21 0617 09/03/21 0646  NA 138 137 138 139 136  K 4.4 4.4 4.0 4.0 3.5  CL 108 108 102 102 104  CO2 '25 23 29 28 31  '$ GLUCOSE 100* 100* 95 95 106*  BUN '16 14 12 12 10  '$ CREATININE 0.93 0.76 0.86 0.80 0.68  CALCIUM 8.8* 8.7* 8.9 8.4* 8.1*  MG  --   --   --  1.4* 2.0   Liver Function Tests: No results for input(s): "AST", "ALT", "ALKPHOS", "BILITOT", "PROT", "ALBUMIN" in the last 168  hours. CBG: Recent Labs  Lab 09/02/21 1208 09/02/21 1625 09/02/21 2132 09/03/21 0657 09/03/21 1149  GLUCAP 81 94 148* 96 77    Discharge time spent: greater than 30 minutes.  Signed: Tawni Millers, MD [facility] 09/03/2021

## 2021-09-03 NOTE — Evaluation (Signed)
Occupational Therapy Evaluation Patient Details Name: [name] MRN: [id number] DOB: July 17, 1955 Today's Date: 09/03/2021   History of Present Illness 66 yo male admitted 08/26/21 with bradycardia/syncope s/p biv ICD placed 09/01/21. PMhx: left TMA, HFrEF, Afib, obesity, HTN, DM, sleep apnea, gout   Clinical Impression   Patient admitted for the diagnosis above.  PTA he lives alone, but has assist as needed from friends and family.  Currently he is needing up to Mod A for initial sit to stand from the recliner, and up to Mod A for lower body ADL from a sit to stand level.  OT will follow in the acute setting, he plans on discharging home with 24 hour assist if needed, and [facility] will be considered if he does not progress as expected.        Recommendations for follow up therapy are one component of a multi-disciplinary discharge planning process, led by the attending physician.  Recommendations may be updated based on patient status, additional functional criteria and insurance authorization.   Follow Up Recommendations  No OT follow up    Assistance Recommended at Discharge Intermittent Supervision/Assistance  Patient can return home with the following Assist for transportation;Assistance with cooking/housework    Functional Status Assessment  Patient has had a recent decline in their functional status and demonstrates the ability to make significant improvements in function in a reasonable and predictable amount of time.  Equipment Recommendations  Tub/shower seat    Recommendations for Other Services       Precautions / Restrictions Precautions Precautions: Fall;ICD/Pacemaker Precaution Comments: bil darco shoes, watch sats on RA Restrictions Weight Bearing Restrictions: No      Mobility Bed Mobility Overal bed mobility: Needs Assistance Bed Mobility: Sit to Supine       Sit to supine: Mod assist        Transfers Overall transfer level: Needs assistance Equipment  used: Rolling walker (2 wheels) Transfers: Sit to/from Stand Sit to Stand: Mod assist                  Balance Overall balance assessment: Needs assistance Sitting-balance support: Feet supported Sitting balance-Leahy Scale: Good     Standing balance support: Reliant on assistive device for balance Standing balance-Leahy Scale: Poor                             ADL either performed or assessed with clinical judgement   ADL       Grooming: Wash/dry hands;Wash/dry face;Set up;Sitting           Upper Body Dressing : Minimal assistance;Sitting   Lower Body Dressing: Moderate assistance;Sit to/from stand   Toilet Transfer: Min guard;Rolling walker (2 wheels);Regular Toilet                   Vision Patient Visual Report: No change from baseline       Perception Perception Perception: Within Functional Limits   Praxis Praxis Praxis: Intact    Pertinent Vitals/Pain Pain Assessment Pain Assessment: No/denies pain     Hand Dominance Right   Extremity/Trunk Assessment Upper Extremity Assessment Upper Extremity Assessment: Generalized weakness   Lower Extremity Assessment Lower Extremity Assessment: Defer to PT evaluation   Cervical / Trunk Assessment Cervical / Trunk Assessment: Normal;Other exceptions Cervical / Trunk Exceptions: Body habitus   Communication Communication Communication: No difficulties   Cognition Arousal/Alertness: Awake/alert Behavior During Therapy: WFL for tasks assessed/performed Overall Cognitive Status: Within  Functional Limits for tasks assessed                                       General Comments   Desaturated to 87% on RA, but rebounded quickly with rest break.    Exercises     Shoulder Instructions      Home Living Family/patient expects to be discharged to:: Private residence Living Arrangements: Alone Available Help at Discharge: Friend(s);Available 24 hours/day Type of Home:  House Home Access: Stairs to enter [employer] of Steps: 1 Entrance Stairs-Rails: None Home Layout: Two level;Able to live on main level with bedroom/bathroom Alternate Level Stairs-Number of Steps: 6 steps then 6 steps Alternate Level Stairs-Rails: Right Bathroom Shower/Tub: Tub/shower unit;Walk-in shower (Tub/shower is the BR he uses)   Biochemist, clinical: Handicapped height Bathroom Accessibility: Yes How Accessible: Accessible via walker Home Equipment: Conservation officer, nature (2 wheels);Cane - single point;Wheelchair - power;Adaptive equipment Adaptive Equipment: Sock aid;Long-handled shoe horn;Long-handled sponge        Prior Functioning/Environment Prior Level of Function : Independent/Modified Independent                        OT Problem List: Decreased strength;Decreased activity tolerance;Impaired balance (sitting and/or standing);Obesity      OT Treatment/Interventions: Self-care/ADL training;Therapeutic activities;DME and/or AE instruction;Patient/family education;Balance training    OT Goals(Current goals can be found in the care plan section) Acute Rehab OT Goals Patient Stated Goal: Return home with assist as needed OT Goal Formulation: With patient Time For Goal Achievement: 09/17/21 Potential to Achieve Goals: Good ADL Goals Pt Will Perform Grooming: with modified independence;standing Pt Will Perform Lower Body Dressing: with modified independence;sit to/from stand Pt Will Transfer to Toilet: with modified independence;ambulating;regular height toilet  OT Frequency: Min 2X/week    Co-evaluation              AM-PAC OT "6 Clicks" Daily Activity     Outcome Measure Help from another person eating meals?: None Help from another person taking care of personal grooming?: None Help from another person toileting, which includes using toliet, bedpan, or urinal?: A Little Help from another person bathing (including washing, rinsing, drying)?: A  Lot Help from another person to put on and taking off regular upper body clothing?: A Little Help from another person to put on and taking off regular lower body clothing?: A Lot 6 Click Score: 18   End of Session Equipment Utilized During Treatment: Gait belt;Rolling walker (2 wheels) Nurse Communication: Mobility status  Activity Tolerance: Patient tolerated treatment well Patient left: in bed;with call bell/phone within reach;with nursing/sitter in room  OT Visit Diagnosis: Unsteadiness on feet (R26.81);Muscle weakness (generalized) (M62.81)                Time: 1010-1032 OT Time Calculation (min): 22 min Charges:  OT General Charges $OT Visit: 1 Visit OT Evaluation $OT Eval Moderate Complexity: 1 Mod  09/03/2021  RP, OTR/L  Acute Rehabilitation Services  Office:  [phone]   Metta Clines 09/03/2021, 10:55 AM

## 2021-09-03 NOTE — Assessment & Plan Note (Addendum)
Positive coronary artery disease, with no significant critical stenosis. Plan to continue medical therapy.   Peripheral vascular disease. Positive wound on right heal. Patient will continue antibiotic therapy and wound care as outpatient with wound care clinic.

## 2021-09-03 NOTE — Progress Notes (Signed)
Physical Therapy Treatment Patient Details Name: SHONTE SODERLUND MRN: [id number] DOB: Aug 06, 1955 Today's Date: 09/03/2021   History of Present Illness 66 yo male admitted 08/26/21 with bradycardia/syncope s/p biv ICD placed 09/01/21. PMhx: left TMA, HFrEF, Afib, obesity, HTN, DM, sleep apnea, gout    PT Comments    Pt pleasant and very willing to mobilize. Discussed options of SNF vs HH and pt reports family and friends can provide sufficient assist while he is unable to push/pull for transfers for minimum of 3 weeks and assist with maintaining ICD precautions. Pt with slowly improving ability with bed mobility and gait this session with education for HEP. Will continue to follow and encouraged IS use and mobility with nursing. Pt achieving 1500cc on IS but requiring supplemental O2 with walking and seated HEP.  HR 72 SpO2 91% on RA at rest, 88% on RA with gait requiring 2L for 93%    Recommendations for follow up therapy are one component of a multi-disciplinary discharge planning process, led by the attending physician.  Recommendations may be updated based on patient status, additional functional criteria and insurance authorization.  Follow Up Recommendations  Home health PT Can patient physically be transported by private vehicle: Yes   Assistance Recommended at Discharge Frequent or constant Supervision/Assistance  Patient can return home with the following A lot of help with walking and/or transfers;A lot of help with bathing/dressing/bathroom;Assist for transportation;Assistance with cooking/housework;Help with stairs or ramp for entrance   Equipment Recommendations  None recommended by PT    Recommendations for Other Services       Precautions / Restrictions Precautions Precautions: Fall;ICD/Pacemaker Precaution Comments: bil darco shoes, watch sats     Mobility  Bed Mobility Overal bed mobility: Needs Assistance Bed Mobility: Supine to Sit     Supine to sit: HOB  elevated, Min guard     General bed mobility comments: HOB 25 degrees with cues for safety and guarding to keep LUE at body to maintain precautions, momentum and increased effort    Transfers Overall transfer level: Needs assistance   Transfers: Sit to/from Stand Sit to Stand: Min assist           General transfer comment: min assist to rise from slightly elevated bed with cues for hand placement, precautions and safety with rocking momentum to achieve    Ambulation/Gait Ambulation/Gait assistance: Min guard Gait Distance (Feet): 110 Feet Assistive device: Rolling walker (2 wheels) Gait Pattern/deviations: Step-through pattern, Decreased stride length, Trunk flexed   Gait velocity interpretation: <1.8 ft/sec, indicate of risk for recurrent falls   General Gait Details: mod cues for proximity to RW to maintain precautions and trunk position. Pt with desaturation to 88% on RA with gait requiring 2L to maintain 93%   Stairs             Wheelchair Mobility    Modified Rankin (Stroke Patients Only)       Balance Overall balance assessment: Needs assistance   Sitting balance-Leahy Scale: Fair Sitting balance - Comments: static sitting without assist   Standing balance support: Bilateral upper extremity supported Standing balance-Leahy Scale: Poor Standing balance comment: reliant on RW in standing                            Cognition Arousal/Alertness: Awake/alert Behavior During Therapy: WFL for tasks assessed/performed Overall Cognitive Status: Within Functional Limits for tasks assessed  Exercises General Exercises - Lower Extremity Long Arc Quad: AROM, Both, Seated, 20 reps Hip Flexion/Marching: AROM, Both, Seated, 20 reps    General Comments        Pertinent Vitals/Pain Pain Assessment Pain Assessment: No/denies pain    Home Living                          Prior  Function            PT Goals (current goals can now be found in the care plan section) Progress towards PT goals: Progressing toward goals    Frequency    Min 3X/week      PT Plan Discharge plan needs to be updated    Co-evaluation              AM-PAC PT "6 Clicks" Mobility   Outcome Measure  Help needed turning from your back to your side while in a flat bed without using bedrails?: A Little Help needed moving from lying on your back to sitting on the side of a flat bed without using bedrails?: A Little Help needed moving to and from a bed to a chair (including a wheelchair)?: A Lot Help needed standing up from a chair using your arms (e.g., wheelchair or bedside chair)?: A Lot Help needed to walk in hospital room?: A Little Help needed climbing 3-5 steps with a railing? : A Lot 6 Click Score: 15    End of Session Equipment Utilized During Treatment: Oxygen Activity Tolerance: Patient tolerated treatment well Patient left: with call bell/phone within reach;in chair Nurse Communication: Mobility status;Precautions PT Visit Diagnosis: Other abnormalities of gait and mobility (R26.89);Difficulty in walking, not elsewhere classified (R26.2);Muscle weakness (generalized) (M62.81)     Time: 9381-0175 PT Time Calculation (min) (ACUTE ONLY): 31 min  Charges:  $Gait Training: 8-22 mins $Therapeutic Exercise: 8-22 mins                     Bayard Males, PT Acute Rehabilitation Services Office: [place] 09/03/2021, 8:55 AM

## 2021-09-03 NOTE — Hospital Course (Signed)
[name] was admitted to the hospital with the working diagnosis of decompensated heart failure.  66 yo male with the past medical history of heart failure, permanent atrial fibrillation, hypertension, OSA, T2DM how presented with bradycardia and near syncope episode. Patient had a near syncope episode at home after he got out of the shower, later during the day at work he had another event of near syncope episode while seating. EMS was called and patient was brought to the hospital. On his initial physical examination his blood pressure was 72/58, HR 47-36, rr 18 and 02 saturation 92%,heart with S1 and S2 present and bradycardic, regular in rhythm, lungs with no wheezing or rhonchi, abdomen not distended and trace lower extremity edema. Had bilateral wraps lower extremities.   Na 137, K 5.0 CL 105 bicarbonate 22, glucose 109, bun 20 and cr at 1,51 BNP 655 Wbc 6,9 hgb 13.0 plt 129  TSH 5,8   Chest radiograph with cardiomegaly with no infiltrates or effusions   EKG 28 bpm, right axis, right bundle branch block, atrial fibrillation rhythm with no ST segment or T wave changes   Patient had worsening bradycardia and hypotension. Decision was made to place a temporary transvenous pacer placed and he was admitted to the intensive care unit.   Echocardiogram with reduced LV systolic function 30 to 25%.   08/18 cardiac catheterization showed widespread coronary atherosclerosis, 90% lesion in the PDA, hemodynamics with biventricular failure worse on the left.    Despite holding B blockade patient continue with bradycardia.  Patient with complete heart block with no recurrence of AV conduction.  09/01/21 BiV ICD implanted.   Patient was placed on goal directed therapy for heart failure and will follow up as outpatient.

## 2021-09-03 NOTE — Progress Notes (Signed)
Mobility Specialist Progress Note:   09/03/21 1234  Mobility  Activity Transferred from chair to bed  Level of Assistance Moderate assist, patient does 50-74% (+2)  Assistive Device  (HHA)  Distance Ambulated (ft) 4 ft  Activity Response Tolerated well  $Mobility charge 1 Mobility   Pt in wheelchair needing to get into bed. No complaints of pain. ModA+2 to stand and step to bed. Left in bed with call bell in reach and all needs met.   [facility] Daschel Roughton Mobility Specialist

## 2021-09-03 NOTE — Assessment & Plan Note (Addendum)
Echocardiogram with reduced LV systolic function with EF 30 to 35%, global hypokinesis. Moderate reduction in RV systolic function. RVSP 44,7. Moderate TR.  08/18 cardiac catheterization RV 57/19 PA 56/22 mean 36 PCWP 24. PVR 1,8 Cardiac output 7.3 (fick) Cardiac index 2,7 (fick)  Pre capillary pulmonary hypertension.   Patient had diuresis with good response, at the time of his discharge, volume balance was negative at -11,413 ml.   Acute decompensation of heart failure.  Patient was placed on entresto, spironolactone, empagliflozin and torsemide.  Holding b blockade for now, possible starting as outpatient.   Essential hypertension, continue blood pressure control with entresto.

## 2021-09-03 NOTE — Progress Notes (Addendum)
Advanced Heart Failure Rounding Note   Subjective:    S/p dual chamber Medtronic ICD 08/23.   Seen while working with OT.  No dyspnea or discomfort at ICD site.     Objective:   Weight Range:  Vital Signs:   Temp:  [97.4 F (36.3 C)-98.4 F (36.9 C)] 97.9 F (36.6 C) (08/24 2134) Pulse Rate:  [67-76] 70 (08/25 0700) Resp:  [14-30] 22 (08/25 0700) BP: (92-144)/(53-104) 144/85 (08/25 0700) SpO2:  [88 %-99 %] 88 % (08/25 0851) Weight:  [149.5 kg] 149.5 kg (08/25 0500) Last BM Date : 09/02/21  Weight change: Filed Weights   08/31/21 0500 09/02/21 0500 09/03/21 0500  Weight: (!) 160.2 kg (!) 153.3 kg (!) 149.5 kg    Intake/Output:   Intake/Output Summary (Last 24 hours) at 09/03/2021 1016 Last data filed at 09/03/2021 0900 Gross per 24 hour  Intake 446.51 ml  Output 2950 ml  Net -2503.49 ml     PHYSICAL EXAM: General:  Well appearing. Sitting on side of bed HEENT: normal Neck: supple. JVP difficult d/t thick neck. Carotids 2+ bilat; no bruits.  Cor: PMI nondisplaced. Regular rate & rhythm. No rubs, gallops or murmurs. Dressing over ICD site Lungs: clear Abdomen: obese, soft, nontender, nondistended.  Extremities: no cyanosis, clubbing, rash, + UNNA b/l lower extremities, s/p left TMA Neuro: alert & orientedx3, cranial nerves grossly intact. moves all 4 extremities w/o difficulty. Affect pleasant    Telemetry: V paced 60s-70s  Labs: Basic Metabolic Panel: Recent Labs  Lab 08/30/21 0541 08/31/21 0100 09/01/21 0715 09/02/21 0617 09/03/21 0646  NA 138 137 138 139 136  K 4.4 4.4 4.0 4.0 3.5  CL 108 108 102 102 104  CO2 '25 23 29 28 31  '$ GLUCOSE 100* 100* 95 95 106*  BUN '16 14 12 12 10  '$ CREATININE 0.93 0.76 0.86 0.80 0.68  CALCIUM 8.8* 8.7* 8.9 8.4* 8.1*  MG  --   --   --  1.4* 2.0    Liver Function Tests: No results for input(s): "AST", "ALT", "ALKPHOS", "BILITOT", "PROT", "ALBUMIN" in the last 168 hours.  No results for input(s): "LIPASE",  "AMYLASE" in the last 168 hours. No results for input(s): "AMMONIA" in the last 168 hours.  CBC: Recent Labs  Lab 08/30/21 0541 08/31/21 0100 09/01/21 0715 09/02/21 0617 09/03/21 0646  WBC 6.5 6.3 6.8 6.9 7.2  HGB 12.9* 12.7* 13.2 12.4* 12.4*  HCT 41.7 39.2 41.1 38.9* 38.1*  MCV 96.1 94.2 93.8 94.9 93.6  PLT 118* 115* 122* 114* 113*    Cardiac Enzymes: No results for input(s): "CKTOTAL", "CKMB", "CKMBINDEX", "TROPONINI" in the last 168 hours.  BNP: BNP (last 3 results) Recent Labs    08/02/21 1150 08/26/21 1259  BNP 475.7* 655.7*    ProBNP (last 3 results) No results for input(s): "PROBNP" in the last 8760 hours.    Other results:  Imaging: DG Chest 2 View  Result Date: 09/02/2021 CLINICAL DATA:  Cardiac implant. EXAM: CHEST - 2 VIEW COMPARISON:  Chest x-ray 08/27/2021. FINDINGS: Streaky right midlung and left basilar opacities. Low lung volumes. Similar enlarged cardiac silhouette and pulmonary vascular congestion without overt pulmonary edema. No visible pneumothorax. Left subclavian approach cardiac rhythm maintenance device. IMPRESSION: 1. Similar cardiomegaly and pulmonary vascular congestion without overt pulmonary edema. 2. Streaky right midlung and left basilar opacities, probably atelectasis/scar. Electronically Signed   By: Margaretha Sheffield M.D.   On: 09/02/2021 13:24   EP PPM/ICD IMPLANT  Result Date: 09/01/2021 Conclusion: Successful  ICD implantation in a patient with complete heart block, chronic systolic heart failure, and chronic atrial fibrillation.  A left bundle area lead was placed in the atrial port with a very nice narrow QRS. Cristopher Peru, MD     Medications:     Scheduled Medications:  [START ON 09/05/2021] apixaban  5 mg Oral BID   aspirin  81 mg Oral Daily   atorvastatin  40 mg Oral QHS   Chlorhexidine Gluconate Cloth  6 each Topical Daily   Chlorhexidine Gluconate Cloth  6 each Topical Q0600   dapagliflozin propanediol  10 mg Oral  Daily   insulin aspart  0-15 Units Subcutaneous TID WC   levofloxacin  750 mg Oral QHS   mupirocin ointment  1 Application Nasal BID   sacubitril-valsartan  1 tablet Oral BID   sertraline  50 mg Oral Daily   sodium chloride flush  3 mL Intravenous Q12H   sodium chloride flush  3 mL Intravenous Q12H   sodium chloride flush  3 mL Intravenous Q12H   spironolactone  12.5 mg Oral Daily   torsemide  20 mg Oral Daily    Infusions:  sodium chloride     sodium chloride Stopped (09/02/21 0355)   sodium chloride Stopped (09/02/21 1607)    PRN Medications: sodium chloride, sodium chloride, acetaminophen, atropine, ondansetron (ZOFRAN) IV, mouth rinse, sodium chloride flush, sodium chloride flush   Assessment/Plan   Acute on Chronic Systolic Biventricular Heart Failure - Echo 02/2017 EF 25-30% w/ diffuse HK. RV normal. Felt to be viral CM +/- component of tachymediated in setting of influenza PNA and rapid Afib  - Echo 07/2017 EF recovered, 50-55%, RV mildly reduced - Echo 11/2018 EF 45-50%, RV mildly reduced (in rate controlled afib)  - Echo 08/23/21 EF 30-35%, severe LVH, moderately reduced RV systolic function, moderate TVR - NYHA Class III, confounded by obesity  - R/L cath 08/27/21. Diffuse non-obstructive CAD with 90% lesion in dLAD - RHC cath with biventricular failure R>L: RA 21 PA 56/22 (36) PCW 24 Fick 7.3/2.7 PaPi 2.1 - given concomitant conduction disease, profound bradycardia and LVH, will need to consider amyloid workup. Can complete as outpatient. - GDMT initially limited by AKI and hypotension, now resolved   - continue GDMT titration as tolerated.  - Continue Entresto 24-26 mg bid (titrate further as outpatient if room) - Increase spiro to 25 mg daily - Continue Farxiga 10 mg daily  - Continue Torsemide 20 mg daily  - now w/ PPM, may consider ? blocker/digoxin further down the road if needed    2. Profound Bradycardia w/ High-grade AV block - s/p placement of TVP  - off  coreg - has known RBBB  - LHC w/o high grade CAD  - pacer dependent, s/p BiV ICD  - appreciate EP's assistance   3. Chronic Afib - dates back to at least 2019, has been historically well rate controlled - has never had attempt at DCCV. Given chronicity , tx w/ rhythm control at this point may be difficult  - size may prohibit ablation  - continue tx of OSA w/ CPAP  - Eliquis has been on hold. Completed procedures. Resume Eliquis evening of 08/27 per EP   4. AKI due to ATN - b/w Scr 0.9 - 1.51 on admit, >>1.68 -> 1.48 -> 0.94 ->0.76->0.86->0.80>0.68 - suspect 2/2 hypotension/ bradycardia - follow BMP    5. PAD/ Chronic Venous Stasis Wounds  - s/p left transmetatarsal amputation - followed by [facility] and Wound clinic  -  Seen by [facility] RN. Wounds felt to be chronic 2/2  lymphedema. - Bedside RN to conduct dressing changes. Will need f/u in wound clinic post d/c  - on levofloxacin 750 mg qd    6. H/o Lymphoma - s/p R-CHOP in 2011 (potential cardiotoxic effects)    7. Type 2DM - Hgb A1c 6.0   8. CAD - diffuse mostly non-obstructive CAD on cath. Not cause of CM - medical management with ASA/statin  - If has angina can consider PCI PDA   9. Morbid obesity - needs weight loss - consider GLP1RA as outpatient  10. Hypertension - Improving w/ Entresto  - Further titration as tolerated   11. Debility/ Weakness - Needs aggressive PT/OT - HH PT recommended at discharge  Okay for discharge from HF perspective. Will arrange for clinic f/u.  HF medications Eliquis 5 BID (resume evening of 08/27) Arlyce Harman 25 mg daily Torsemide 20 mg daily Entresto 24/26 mg BID Farxiga 10 mg daily Atorvastatin 40 mg daily Aspirin 81 mg daily   Length of Stay: 8   [facility], LINDSAY N PA-C  09/03/2021, 10:16 AM  Advanced Heart Failure Team Pager [phone] (M-F; 7a - 4p)  Please contact [facility] for night-coverage after hours (4p -7a ) and weekends on [email or web address]   Patient seen and examined  with the above-signed Advanced Practice Provider and/or Housestaff. I personally reviewed laboratory data, imaging studies and relevant notes. I independently examined the patient and formulated the important aspects of the plan. I have edited the note to reflect any of my changes or salient points. I have personally discussed the plan with the patient and/or family.  Remains weak. No CP or SOB. Pacer working well  General:  Lying in bed  No resp difficulty HEENT: normal Neck: supple. no JVD. Carotids 2+ bilat; no bruits. No lymphadenopathy or thryomegaly appreciated. Cor: PMI nondisplaced. Regular rate & rhythm. No rubs, gallops or murmurs. Pacer site ok  Lungs: clear Abdomen: obese soft, nontender, nondistended. No hepatosplenomegaly. No bruits or masses. Good bowel sounds. Extremities: no cyanosis, clubbing, rash, wrapped trace edema Neuro: alert & orientedx3, cranial nerves grossly intact. moves all 4 extremities w/o difficulty. Affect pleasant  Much improved. Ok for d/c from cardiac perspective on meds listed above (reviewed with PharmD).   Will arrange outpatient f/u.   Glori Bickers, MD  2:27 PM

## 2021-09-04 ENCOUNTER — Other Ambulatory Visit (HOSPITAL_COMMUNITY): Payer: Self-pay

## 2021-09-04 DIAGNOSIS — I442 Atrioventricular block, complete: Secondary | ICD-10-CM | POA: Diagnosis not present

## 2021-09-04 LAB — BASIC METABOLIC PANEL
Anion gap: 9 (ref 5–15)
BUN: 11 mg/dL (ref 8–23)
CO2: 27 mmol/L (ref 22–32)
Calcium: 8.5 mg/dL — ABNORMAL LOW (ref 8.9–10.3)
Chloride: 100 mmol/L (ref 98–111)
Creatinine, Ser: 0.77 mg/dL (ref 0.61–1.24)
GFR, Estimated: >60 mL/min (ref >60)
Glucose, Bld: 94 mg/dL (ref 70–99)
Potassium: 3.6 mmol/L (ref 3.5–5.1)
Sodium: 136 mmol/L (ref 135–145)

## 2021-09-04 LAB — GLUCOSE, CAPILLARY: Glucose-Capillary: 97 mg/dL (ref 70–99)

## 2021-09-04 LAB — CBC
HCT: 36.4 % — ABNORMAL LOW (ref 39.0–52.0)
Hemoglobin: 12.1 g/dL — ABNORMAL LOW (ref 13.0–17.0)
MCH: 30.6 pg (ref 26.0–34.0)
MCHC: 33.2 g/dL (ref 30.0–36.0)
MCV: 92.2 fL (ref 80.0–100.0)
Platelets: 114 10*3/uL — ABNORMAL LOW (ref 150–400)
RBC: 3.95 MIL/uL — ABNORMAL LOW (ref 4.22–5.81)
RDW: 17.2 % — ABNORMAL HIGH (ref 11.5–15.5)
WBC: 6.8 10*3/uL (ref 4.0–10.5)
nRBC: 0 % (ref 0.0–0.2)

## 2021-09-04 LAB — MAGNESIUM: Magnesium: 1.9 mg/dL (ref 1.7–2.4)

## 2021-09-04 MED ORDER — DICLOFENAC SODIUM 1 % EX GEL
2.0000 g | Freq: Four times a day (QID) | CUTANEOUS | Status: DC
  Administered 2021-09-04: 2 g via TOPICAL
  Filled 2021-09-04: qty 100

## 2021-09-04 MED ORDER — DICLOFENAC SODIUM 1 % EX GEL: 2.0000 g | Freq: Four times a day (QID) | CUTANEOUS | 0 refills | Status: AC

## 2021-09-04 NOTE — Plan of Care (Signed)
Discharge education discussed with patient and spouse. All belongings gathered and education concerning home meds, upcoming appointments and numbers to call for questions or concerns was discussed.  Both patient and spouse expressed understanding.

## 2021-09-04 NOTE — TOC Transition Note (Signed)
Transition of Care [facility]) - CM/SW Discharge Note   Patient Details  Name: SANTONIO SPEAKMAN MRN: [id number] Date of Birth: 1955/01/27  Transition of Care [facility]) CM/SW Contact:  Carles Collet, RN Phone Number: 09/04/2021, 10:26 AM   Clinical Narrative:    Notified [place] planned for today. Oxygen ordered for home yesterday, it is in the room will go home with patient today. 3/1 ordered to be delivered to the house per patient preference. No other TOC needs identified for DC    Final next level of care: Home w Home Health Services Barriers to Discharge: No Barriers Identified   Patient Goals and CMS Choice Patient states their goals for this hospitalization and ongoing recovery are:: wants to remain independent CMS Medicare.gov Compare Post Acute Care list provided to:: Patient Choice offered to / list presented to : Patient  Discharge Placement                       Discharge Plan and Services In-house Referral: Clinical Social Work Discharge Planning Services: CM Consult Post Acute Care Choice: Home Health          DME Arranged: 3-N-1 DME Agency: [employer] Date DME Agency Contacted: 09/03/21 Time DME Agency Contacted: [phone] Representative spoke with at DME Agency: Jessie: RN, PT [facility] Agency: [place]) Date [name]: 09/03/21 Time [place]: 1631    Social Determinants of Health (SDOH) Interventions     Readmission Risk Interventions     No data to display

## 2021-09-04 NOTE — Progress Notes (Signed)
Patient is feeling well this am, he has a new left shoulder pain, worse with movement. No dyspnea or chest pain.  His discharge was delayed because no family at his  home yesterday  BP (!) 131/59 (BP Location: Left Arm)   Pulse 69   Temp 98.2 F (36.8 C) (Oral)   Resp (!) 21   Ht '5\' 11"'$  (1.803 m)   Wt (!) 149 kg   SpO2 97%   BMI 45.81 kg/m   Neurology awake and alert ENT with no pallor Cardiovascular with S1 and S2 present  Respiratory with no rales or wheezing Abdomen not distended No lower extremity edema  Heart failure exacerbation and heart block sp pacer.  Plan to add topical diclofenac to left shoulder and discharge home today with outpatient follow up.

## 2021-09-04 NOTE — Plan of Care (Signed)
  Problem: Coping: Goal: Ability to adjust to condition or change in health will improve Outcome: Progressing   Problem: Fluid Volume: Goal: Ability to maintain a balanced intake and output will improve Outcome: Progressing   Problem: Health Behavior/Discharge Planning: Goal: Ability to manage health-related needs will improve Outcome: Progressing   

## 2021-09-05 DIAGNOSIS — M150 Primary generalized (osteo)arthritis: Secondary | ICD-10-CM | POA: Diagnosis not present

## 2021-09-05 DIAGNOSIS — L97829 Non-pressure chronic ulcer of other part of left lower leg with unspecified severity: Secondary | ICD-10-CM | POA: Diagnosis not present

## 2021-09-05 DIAGNOSIS — I272 Pulmonary hypertension, unspecified: Secondary | ICD-10-CM | POA: Diagnosis not present

## 2021-09-05 DIAGNOSIS — I4821 Permanent atrial fibrillation: Secondary | ICD-10-CM | POA: Diagnosis not present

## 2021-09-05 DIAGNOSIS — I2510 Atherosclerotic heart disease of native coronary artery without angina pectoris: Secondary | ICD-10-CM | POA: Diagnosis not present

## 2021-09-05 DIAGNOSIS — G4733 Obstructive sleep apnea (adult) (pediatric): Secondary | ICD-10-CM | POA: Diagnosis not present

## 2021-09-05 DIAGNOSIS — I5023 Acute on chronic systolic (congestive) heart failure: Secondary | ICD-10-CM | POA: Diagnosis not present

## 2021-09-05 DIAGNOSIS — Z6841 Body Mass Index (BMI) 40.0 and over, adult: Secondary | ICD-10-CM | POA: Diagnosis not present

## 2021-09-05 DIAGNOSIS — E1169 Type 2 diabetes mellitus with other specified complication: Secondary | ICD-10-CM | POA: Diagnosis not present

## 2021-09-05 DIAGNOSIS — E785 Hyperlipidemia, unspecified: Secondary | ICD-10-CM | POA: Diagnosis not present

## 2021-09-05 DIAGNOSIS — L97819 Non-pressure chronic ulcer of other part of right lower leg with unspecified severity: Secondary | ICD-10-CM | POA: Diagnosis not present

## 2021-09-05 DIAGNOSIS — N179 Acute kidney failure, unspecified: Secondary | ICD-10-CM | POA: Diagnosis not present

## 2021-09-05 DIAGNOSIS — E1151 Type 2 diabetes mellitus with diabetic peripheral angiopathy without gangrene: Secondary | ICD-10-CM | POA: Diagnosis not present

## 2021-09-05 DIAGNOSIS — I110 Hypertensive heart disease with heart failure: Secondary | ICD-10-CM | POA: Diagnosis not present

## 2021-09-05 DIAGNOSIS — I890 Lymphedema, not elsewhere classified: Secondary | ICD-10-CM | POA: Diagnosis not present

## 2021-09-06 ENCOUNTER — Telehealth (HOSPITAL_COMMUNITY): Payer: Self-pay | Admitting: Pharmacy Technician

## 2021-09-06 ENCOUNTER — Encounter (HOSPITAL_BASED_OUTPATIENT_CLINIC_OR_DEPARTMENT_OTHER): Payer: Medicare Other | Admitting: Internal Medicine

## 2021-09-06 DIAGNOSIS — G4730 Sleep apnea, unspecified: Secondary | ICD-10-CM | POA: Diagnosis not present

## 2021-09-06 DIAGNOSIS — E11621 Type 2 diabetes mellitus with foot ulcer: Secondary | ICD-10-CM | POA: Diagnosis not present

## 2021-09-06 DIAGNOSIS — Z8572 Personal history of non-Hodgkin lymphomas: Secondary | ICD-10-CM | POA: Diagnosis not present

## 2021-09-06 DIAGNOSIS — G4733 Obstructive sleep apnea (adult) (pediatric): Secondary | ICD-10-CM | POA: Diagnosis not present

## 2021-09-06 DIAGNOSIS — Z87891 Personal history of nicotine dependence: Secondary | ICD-10-CM | POA: Diagnosis not present

## 2021-09-06 DIAGNOSIS — I87333 Chronic venous hypertension (idiopathic) with ulcer and inflammation of bilateral lower extremity: Secondary | ICD-10-CM | POA: Diagnosis not present

## 2021-09-06 DIAGNOSIS — L97518 Non-pressure chronic ulcer of other part of right foot with other specified severity: Secondary | ICD-10-CM | POA: Diagnosis not present

## 2021-09-06 DIAGNOSIS — I890 Lymphedema, not elsewhere classified: Secondary | ICD-10-CM | POA: Diagnosis not present

## 2021-09-06 DIAGNOSIS — R5381 Other malaise: Secondary | ICD-10-CM | POA: Diagnosis not present

## 2021-09-06 DIAGNOSIS — I110 Hypertensive heart disease with heart failure: Secondary | ICD-10-CM | POA: Diagnosis not present

## 2021-09-06 DIAGNOSIS — E1151 Type 2 diabetes mellitus with diabetic peripheral angiopathy without gangrene: Secondary | ICD-10-CM | POA: Diagnosis not present

## 2021-09-06 DIAGNOSIS — L97828 Non-pressure chronic ulcer of other part of left lower leg with other specified severity: Secondary | ICD-10-CM | POA: Diagnosis not present

## 2021-09-06 DIAGNOSIS — L97818 Non-pressure chronic ulcer of other part of right lower leg with other specified severity: Secondary | ICD-10-CM | POA: Diagnosis not present

## 2021-09-06 DIAGNOSIS — L089 Local infection of the skin and subcutaneous tissue, unspecified: Secondary | ICD-10-CM | POA: Diagnosis not present

## 2021-09-06 DIAGNOSIS — T148XXA Other injury of unspecified body region, initial encounter: Secondary | ICD-10-CM | POA: Diagnosis not present

## 2021-09-06 DIAGNOSIS — I5022 Chronic systolic (congestive) heart failure: Secondary | ICD-10-CM | POA: Diagnosis not present

## 2021-09-06 NOTE — Progress Notes (Signed)
[name] ([id number]) , Visit Report for 09/06/2021 Arrival Information Details Patient Name: Date of Service: [name], [name] 09/06/2021 3:30 PM Medical Record Number: [id number] Patient Account Number: [id number] Date of Birth/Sex: Treating RN: [birth year] (66 y.o. [name] Primary Care Neomia Herbel: [name] Other Clinician: Referring Myrth Dahan: Treating Renatha Rosen/Extender: Lucky Rathke in Treatment: 4 Visit Information History Since Last Visit Added or deleted any medications: No Patient Arrived: Wheel Chair Any new allergies or adverse reactions: No Arrival Time: 15:23 Had a fall or experienced change in No Transfer Assistance: Manual activities of daily living that may affect Patient Identification Verified: Yes risk of falls: Secondary Verification Process Completed: Yes Signs or symptoms of abuse/neglect since last visito No Patient Requires Transmission-Based Precautions: No Hospitalized since last visit: No Patient Has Alerts: Yes Implantable device outside of the clinic excluding No Patient Alerts: Patient on Blood Thinner cellular tissue based products placed in the center 01/2021 ABI L 1.13 R 1.06 since last visit: 01/2021 TBI L amp R 0.8 Has Dressing in Place as Prescribed: Yes Has Compression in Place as Prescribed: Yes Pain Present Now: No Electronic Signature(s) Signed: 09/06/2021 5:12:03 PM By: Lorrin Jackson Entered By: Lorrin Jackson on 09/06/2021 15:23:58 -------------------------------------------------------------------------------- Compression Therapy Details Patient Name: Date of Service: [name] Gloss D. 09/06/2021 3:30 PM Medical Record Number: [id number] Patient Account Number: [id number] Date of Birth/Sex: Treating RN: Nov 28, 1955 (66 y.o. [name] Primary Care Larrell Rapozo: [name] Other Clinician: Referring Keola Heninger: Treating Vasco Chong/Extender: Lucky Rathke in Treatment:  4 Compression Therapy Performed for Wound Assessment: Wound #11 Left,Medial Lower Leg Performed By: Clinician Lorrin Jackson, RN Compression Type: Three Layer Electronic Signature(s) Signed: 09/06/2021 5:12:03 PM By: Lorrin Jackson Entered By: Lorrin Jackson on 09/06/2021 15:35:46 -------------------------------------------------------------------------------- Compression Therapy Details Patient Name: Date of Service: [name] Gloss D. 09/06/2021 3:30 PM Medical Record Number: [id number] Patient Account Number: [id number] Date of Birth/Sex: Treating RN: 02/19/1955 (66 y.o. [name] Primary Care Ruford Dudzinski: [name] Other Clinician: Referring Guy Seese: Treating Ether Wolters/Extender: Lucky Rathke in Treatment: 4 Compression Therapy Performed for Wound Assessment: Wound #10 Right T Third oe Performed By: Clinician Lorrin Jackson, RN Compression Type: Three Layer Electronic Signature(s) Signed: 09/06/2021 5:12:03 PM By: Lorrin Jackson Entered By: Lorrin Jackson on 09/06/2021 15:35:46 -------------------------------------------------------------------------------- Compression Therapy Details Patient Name: Date of Service: [name] Gloss D. 09/06/2021 3:30 PM Medical Record Number: [id number] Patient Account Number: [id number] Date of Birth/Sex: Treating RN: [birth year] (66 y.o. [name] Primary Care Isaack Preble: [name] Other Clinician: Referring Adelae Yodice: Treating Edwena Mayorga/Extender: Lucky Rathke in Treatment: 4 Compression Therapy Performed for Wound Assessment: Wound #12 Left,Anterior Lower Leg Performed By: Clinician Lorrin Jackson, RN Compression Type: Three Layer Electronic Signature(s) Signed: 09/06/2021 5:12:03 PM By: Lorrin Jackson Entered By: Lorrin Jackson on 09/06/2021 15:35:46 -------------------------------------------------------------------------------- Compression Therapy Details Patient Name: Date  of Service: [name] Gloss D. 09/06/2021 3:30 PM Medical Record Number: [id number] Patient Account Number: [id number] Date of Birth/Sex: Treating RN: October 22, 1955 (66 y.o. [name] Primary Care Aland Chestnutt: [name] Other Clinician: Referring Jaylena Holloway: Treating Rayden Scheper/Extender: Lucky Rathke in Treatment: 4 Compression Therapy Performed for Wound Assessment: Wound #7 Right,Medial Malleolus Performed By: Clinician Lorrin Jackson, RN Compression Type: Three Layer Electronic Signature(s) Signed: 09/06/2021 5:12:03 PM By: Lorrin Jackson Entered By: Lorrin Jackson on 09/06/2021 15:35:46 -------------------------------------------------------------------------------- Compression Therapy Details Patient Name: Date of Service: [name] Gloss D. 09/06/2021 3:30 PM Medical Record Number: [id number] Patient Account  Number: [id number] Date of Birth/Sex: Treating RN: 1955-08-02 (66 y.o. [name] Primary Care [name]: Other Clinician: [name] Referring Brantleigh Mifflin: Treating Thena Devora/Extender: Lucky Rathke in Treatment: 4 Compression Therapy Performed for Wound Assessment: Wound #8 Right,Lateral Malleolus Performed By: Clinician Lorrin Jackson, RN Compression Type: Three Layer Electronic Signature(s) Signed: 09/06/2021 5:12:03 PM By: Lorrin Jackson Entered By: Lorrin Jackson on 09/06/2021 15:35:46 -------------------------------------------------------------------------------- Compression Therapy Details Patient Name: Date of Service: [name] Gloss D. 09/06/2021 3:30 PM Medical Record Number: [id number] Patient Account Number: [id number] Date of Birth/Sex: Treating RN: 1955/02/13 (66 y.o. [name] Primary Care [name]: [name] Other Clinician: Referring Aliyanna Wassmer: Treating Cletis Clack/Extender: Lucky Rathke in Treatment: 4 Compression Therapy Performed for Wound Assessment: Wound #9  Right T Second oe Performed By: Clinician Lorrin Jackson, RN Compression Type: Three Layer Electronic Signature(s) Signed: 09/06/2021 5:12:03 PM By: Lorrin Jackson Entered By: Lorrin Jackson on 09/06/2021 15:35:46 -------------------------------------------------------------------------------- Encounter Discharge Information Details Patient Name: Date of Service: Pickler, [name] 09/06/2021 3:30 PM Medical Record Number: [id number] Patient Account Number: [id number] Date of Birth/Sex: Treating RN: 12/31/1955 (66 y.o. [name] Primary Care Conlin Brahm: [name] Other Clinician: Referring Ashanti [name]: Treating Kebra Lowrimore/Extender: Lucky Rathke in Treatment: 4 Encounter Discharge Information Items Discharge Condition: Stable Ambulatory Status: Wheelchair Discharge Destination: Home Transportation: Private Auto Schedule Follow-up Appointment: Yes Clinical Summary of Care: Provided on 09/06/2021 Form Type Recipient Paper Patient Patient Electronic Signature(s) Signed: 09/06/2021 5:12:03 PM By: Lorrin Jackson Entered By: Lorrin Jackson on 09/06/2021 15:36:58 -------------------------------------------------------------------------------- Patient/Caregiver Education Details Patient Name: Date of Service: [name] 8/28/2023andnbsp3:30 PM Medical Record Number: [id number] Patient Account Number: [id number] Date of Birth/Gender: Treating RN: 1955/05/14 (66 y.o. [name] Primary Care Physician: [name] Other Clinician: Referring Physician: Treating Physician/Extender: Lucky Rathke in Treatment: 4 Education Assessment Education Provided To: Patient Education Topics Provided Infection: Methods: Explain/Verbal, Printed Responses: State content correctly Venous: Methods: Explain/Verbal, Printed Responses: State content correctly Wound/Skin Impairment: Methods: Demonstration, Explain/Verbal Responses:  State content correctly Electronic Signature(s) Signed: 09/06/2021 5:12:03 PM By: Lorrin Jackson Entered By: Lorrin Jackson on 09/06/2021 15:36:40 -------------------------------------------------------------------------------- Wound Assessment Details Patient Name: Date of Service: Reeser, [name] 09/06/2021 3:30 PM Medical Record Number: [id number] Patient Account Number: [id number] Date of Birth/Sex: Treating RN: May 28, 1955 (66 y.o. [name] Primary Care Erle Guster: [name] Other Clinician: Referring Maddisyn Hegwood: Treating Jerrad Mendibles/Extender: Lucky Rathke in Treatment: 4 Wound Status Wound Number: 10 Primary Diabetic Wound/Ulcer of the Lower Extremity Etiology: Wound Location: Right T Third oe Wound Open Wounding Event: Gradually Appeared Status: Date Acquired: 07/16/2021 Comorbid Sleep Apnea, Arrhythmia, Congestive Heart Failure, Hypertension, Weeks Of Treatment: 4 History: Hypotension, Peripheral Arterial Disease, Peripheral Venous Clustered Wound: No Disease, Type II Diabetes, Gout, Osteoarthritis Wound Measurements Length: (cm) 0.3 Width: (cm) 0.3 Depth: (cm) 0.1 Area: (cm) 0.071 Volume: (cm) 0.007 % Reduction in Area: 74.2% % Reduction in Volume: 74.1% Epithelialization: Medium (34-66%) Tunneling: No Undermining: No Wound Description Classification: Grade 2 Wound Margin: Distinct, outline attached Exudate Amount: Medium Exudate Type: Serosanguineous Exudate Color: red, brown Foul Odor After Cleansing: No Slough/Fibrino Yes Wound Bed Granulation Amount: Large (67-100%) Exposed Structure Granulation Quality: Red, Pink Fascia Exposed: No Necrotic Amount: Small (1-33%) Fat Layer (Subcutaneous Tissue) Exposed: Yes Necrotic Quality: Adherent Slough Tendon Exposed: No Muscle Exposed: No Joint Exposed: No Bone Exposed: No Treatment Notes Wound #10 (Toe Third) Wound Laterality: Right Cleanser Wound Cleanser Discharge  Instruction: Cleanse the wound  with wound cleanser prior to applying a clean dressing using gauze sponges, not tissue or cotton balls. Peri-Wound Care Topical keystone Discharge Instruction: Begin using topically to wounds once it gets sent to your house. Primary Dressing KerraCel Ag Gelling Fiber Dressing, 4x5 in (silver alginate) Discharge Instruction: Apply silver alginate to wound bed as instructed Secondary Dressing Woven Gauze Sponges 2x2 in Discharge Instruction: Apply over primary dressing as directed. Secured With [employer] Surgical T 2x10 (in/yd) ape Discharge Instruction: Secure with tape as directed. Compression Wrap Compression Stockings Add-Ons Electronic Signature(s) Signed: 09/06/2021 5:12:03 PM By: Lorrin Jackson Entered By: Lorrin Jackson on 09/06/2021 15:32:41 -------------------------------------------------------------------------------- Wound Assessment Details Patient Name: Date of Service: Wiegman, [name] 09/06/2021 3:30 PM Medical Record Number: [id number] Patient Account Number: [id number] Date of Birth/Sex: Treating RN: 1955/05/19 (66 y.o. [name] Primary Care Esker Dever: [name] Other Clinician: Referring Merit Gadsby: Treating Tijana Walder/Extender: Lucky Rathke in Treatment: 4 Wound Status Wound Number: 11 Primary Venous Leg Ulcer Etiology: Wound Location: Left, Medial Lower Leg Secondary Lymphedema Wounding Event: Gradually Appeared Etiology: Date Acquired: 07/22/2021 Wound Open Weeks Of Treatment: 4 Status: Clustered Wound: No Comorbid Sleep Apnea, Arrhythmia, Congestive Heart Failure, History: Hypertension, Hypotension, Peripheral Arterial Disease, Peripheral Venous Disease, Type II Diabetes, Gout, Osteoarthritis Wound Measurements Length: (cm) 2.8 Width: (cm) 1.3 Depth: (cm) 0.1 Area: (cm) 2.859 Volume: (cm) 0.286 % Reduction in Area: 48% % Reduction in Volume: 48% Epithelialization:  Medium (34-66%) Tunneling: No Undermining: No Wound Description Classification: Full Thickness Without Exposed Support Structures Wound Margin: Distinct, outline attached Exudate Amount: Large Exudate Type: Serosanguineous Exudate Color: red, brown Foul Odor After Cleansing: No Slough/Fibrino Yes Wound Bed Granulation Amount: Medium (34-66%) Exposed Structure Granulation Quality: Red, Pink Fascia Exposed: No Necrotic Amount: Medium (34-66%) Fat Layer (Subcutaneous Tissue) Exposed: Yes Necrotic Quality: Adherent Slough Tendon Exposed: No Muscle Exposed: No Joint Exposed: No Bone Exposed: No Treatment Notes Wound #11 (Lower Leg) Wound Laterality: Left, Medial Cleanser Peri-Wound Care Zinc Oxide Ointment 30g tube Discharge Instruction: Apply Zinc Oxide to periwound with each dressing change Sween Lotion (Moisturizing lotion) Discharge Instruction: Apply moisturizing lotion as directed Topical Keystone Primary Dressing IODOFLEX 0.9% Cadexomer Iodine Pad 4x6 cm Discharge Instruction: Apply to wound bed as instructed Secondary Dressing ABD Pad, 8x10 Discharge Instruction: Apply over primary dressing as directed. CarboFLEX Odor Control Dressing, 4x4 in Discharge Instruction: Apply over primary dressing as directed. Zetuvit Plus 4x8 in Discharge Instruction: Apply over primary dressing as directed. Secured With Compression Wrap ThreePress (3 layer compression wrap) Discharge Instruction: Apply three layer compression as directed. Compression Stockings Add-Ons Electronic Signature(s) Signed: 09/06/2021 5:12:03 PM By: Lorrin Jackson Entered By: Lorrin Jackson on 09/06/2021 15:33:06 -------------------------------------------------------------------------------- Wound Assessment Details Patient Name: Date of Service: [name] Gloss D. 09/06/2021 3:30 PM Medical Record Number: [id number] Patient Account Number: [id number] Date of Birth/Sex: Treating RN: 03/30/1955 (66 y.o. [name] Primary Care Laney Louderback: [name] Other Clinician: Referring Hersh Minney: Treating Hedy Garro/Extender: Lucky Rathke in Treatment: 4 Wound Status Wound Number: 12 Primary Venous Leg Ulcer Etiology: Wound Location: Left, Anterior Lower Leg Secondary Lymphedema Wounding Event: Gradually Appeared Etiology: Date Acquired: 07/15/2021 Wound Open Weeks Of Treatment: 4 Status: Clustered Wound: No Comorbid Sleep Apnea, Arrhythmia, Congestive Heart Failure, History: Hypertension, Hypotension, Peripheral Arterial Disease, Peripheral Venous Disease, Type II Diabetes, Gout, Osteoarthritis Wound Measurements Length: (cm) 3.5 Width: (cm) 5.5 Depth: (cm) 0.2 Area: (cm) 15.119 Volume: (cm) 3.024 % Reduction in Area: -10.6% % Reduction in Volume: -  121.2% Epithelialization: None Tunneling: No Undermining: No Wound Description Classification: Full Thickness Without Exposed Support Structures Wound Margin: Distinct, outline attached Exudate Amount: Large Exudate Type: Serosanguineous Exudate Color: red, brown Foul Odor After Cleansing: No Slough/Fibrino Yes Wound Bed Granulation Amount: Large (67-100%) Exposed Structure Granulation Quality: Red, Pink Fascia Exposed: No Necrotic Amount: Small (1-33%) Fat Layer (Subcutaneous Tissue) Exposed: Yes Necrotic Quality: Adherent Slough Tendon Exposed: No Muscle Exposed: No Joint Exposed: No Bone Exposed: No Treatment Notes Wound #12 (Lower Leg) Wound Laterality: Left, Anterior Cleanser Peri-Wound Care Zinc Oxide Ointment 30g tube Discharge Instruction: Apply Zinc Oxide to periwound with each dressing change Sween Lotion (Moisturizing lotion) Discharge Instruction: Apply moisturizing lotion as directed Topical Keystone Primary Dressing IODOFLEX 0.9% Cadexomer Iodine Pad 4x6 cm Discharge Instruction: Apply to wound bed as instructed Secondary Dressing ABD Pad, 8x10 Discharge Instruction: Apply  over primary dressing as directed. CarboFLEX Odor Control Dressing, 4x4 in Discharge Instruction: Apply over primary dressing as directed. Zetuvit Plus 4x8 in Discharge Instruction: Apply over primary dressing as directed. Secured With Compression Wrap ThreePress (3 layer compression wrap) Discharge Instruction: Apply three layer compression as directed. Compression Stockings Add-Ons Electronic Signature(s) Signed: 09/06/2021 5:12:03 PM By: Lorrin Jackson Entered By: Lorrin Jackson on 09/06/2021 15:33:40 -------------------------------------------------------------------------------- Wound Assessment Details Patient Name: Date of Service: Sterling, [name] 09/06/2021 3:30 PM Medical Record Number: [id number] Patient Account Number: [id number] Date of Birth/Sex: Treating RN: Apr 06, 1955 (66 y.o. [name] Primary Care Sakiya Stepka: [name] Other Clinician: Referring Marissa Lowrey: Treating Greenley Martone/Extender: Lucky Rathke in Treatment: 4 Wound Status Wound Number: 7 Primary Venous Leg Ulcer Etiology: Wound Location: Right, Medial Malleolus Secondary Lymphedema Wounding Event: Blister Etiology: Date Acquired: 07/21/2021 Wound Open Weeks Of Treatment: 4 Status: Clustered Wound: No Comorbid Sleep Apnea, Arrhythmia, Congestive Heart Failure, History: Hypertension, Hypotension, Peripheral Arterial Disease, Peripheral Venous Disease, Type II Diabetes, Gout, Osteoarthritis Wound Measurements Length: (cm) 6.5 Width: (cm) 8 Depth: (cm) 0.5 Area: (cm) 40.841 Volume: (cm) 20.42 % Reduction in Area: -6.7% % Reduction in Volume: 23.8% Epithelialization: None Tunneling: No Undermining: No Wound Description Classification: Full Thickness With Exposed Support Structures Wound Margin: Distinct, outline attached Exudate Amount: Large Exudate Type: Serosanguineous Exudate Color: red, brown Foul Odor After Cleansing: No Slough/Fibrino Yes Wound  Bed Granulation Amount: Small (1-33%) Exposed Structure Granulation Quality: Red Fascia Exposed: No Necrotic Amount: Large (67-100%) Fat Layer (Subcutaneous Tissue) Exposed: Yes Necrotic Quality: Adherent Slough Tendon Exposed: No Muscle Exposed: No Joint Exposed: No Bone Exposed: No Treatment Notes Wound #7 (Malleolus) Wound Laterality: Right, Medial Cleanser Peri-Wound Care Zinc Oxide Ointment 30g tube Discharge Instruction: Apply Zinc Oxide to periwound with each dressing change Sween Lotion (Moisturizing lotion) Discharge Instruction: Apply moisturizing lotion as directed Topical Keystone Primary Dressing IODOFLEX 0.9% Cadexomer Iodine Pad 4x6 cm Discharge Instruction: Apply to wound bed as instructed Secondary Dressing ABD Pad, 8x10 Discharge Instruction: Apply over primary dressing as directed. CarboFLEX Odor Control Dressing, 4x4 in Discharge Instruction: Apply over primary dressing as directed. Zetuvit Plus 4x8 in Discharge Instruction: Apply over primary dressing as directed. Secured With Compression Wrap ThreePress (3 layer compression wrap) Discharge Instruction: Apply three layer compression as directed. Compression Stockings Add-Ons Electronic Signature(s) Signed: 09/06/2021 5:12:03 PM By: Lorrin Jackson Entered By: Lorrin Jackson on 09/06/2021 15:34:12 -------------------------------------------------------------------------------- Wound Assessment Details Patient Name: Date of Service: Alcock, [name] 09/06/2021 3:30 PM Medical Record Number: [id number] Patient Account Number: [id number] Date of Birth/Sex: Treating RN: 1955-09-15 (66 y.o. [name] Primary Care Tallyn Holroyd: Brigitte Pulse,  Gwyndolyn Saxon Other Clinician: Referring Iyari Hagner: Treating Traven Davids/Extender: Lucky Rathke in Treatment: 4 Wound Status Wound Number: 8 Primary Venous Leg Ulcer Etiology: Wound Location: Right, Lateral Malleolus Secondary Lymphedema Wounding  Event: Gradually Appeared Etiology: Date Acquired: 07/22/2021 Wound Open Weeks Of Treatment: 4 Status: Clustered Wound: Yes Comorbid Sleep Apnea, Arrhythmia, Congestive Heart Failure, History: Hypertension, Hypotension, Peripheral Arterial Disease, Peripheral Venous Disease, Type II Diabetes, Gout, Osteoarthritis Wound Measurements Length: (cm) 2.5 Width: (cm) 4 Depth: (cm) 0.1 Area: (cm) 7.854 Volume: (cm) 0.785 % Reduction in Area: 66% % Reduction in Volume: 83% Epithelialization: Medium (34-66%) Tunneling: No Undermining: No Wound Description Classification: Full Thickness Without Exposed Support Structures Wound Margin: Distinct, outline attached Exudate Amount: Large Exudate Type: Serosanguineous Exudate Color: red, brown Wound Bed Granulation Amount: Large (67-100%) Granulation Quality: Red, Pink Necrotic Amount: Small (1-33%) Necrotic Quality: Adherent Slough Foul Odor After Cleansing: No Slough/Fibrino Yes Exposed Structure Fascia Exposed: No Fat Layer (Subcutaneous Tissue) Exposed: Yes Tendon Exposed: No Muscle Exposed: No Joint Exposed: No Bone Exposed: No Treatment Notes Wound #8 (Malleolus) Wound Laterality: Right, Lateral Cleanser Peri-Wound Care Zinc Oxide Ointment 30g tube Discharge Instruction: Apply Zinc Oxide to periwound with each dressing change Sween Lotion (Moisturizing lotion) Discharge Instruction: Apply moisturizing lotion as directed Topical Keystone Primary Dressing IODOFLEX 0.9% Cadexomer Iodine Pad 4x6 cm Discharge Instruction: Apply to wound bed as instructed Secondary Dressing ABD Pad, 8x10 Discharge Instruction: Apply over primary dressing as directed. CarboFLEX Odor Control Dressing, 4x4 in Discharge Instruction: Apply over primary dressing as directed. Zetuvit Plus 4x8 in Discharge Instruction: Apply over primary dressing as directed. Secured With Compression Wrap ThreePress (3 layer compression wrap) Discharge  Instruction: Apply three layer compression as directed. Compression Stockings Add-Ons Electronic Signature(s) Signed: 09/06/2021 5:12:03 PM By: Lorrin Jackson Entered By: Lorrin Jackson on 09/06/2021 15:34:55 -------------------------------------------------------------------------------- Wound Assessment Details Patient Name: Date of Service: Griffey, [name] 09/06/2021 3:30 PM Medical Record Number: [id number] Patient Account Number: [id number] Date of Birth/Sex: Treating RN: 22-Jun-1955 (66 y.o. [name] Primary Care Abriel Geesey: [name] Other Clinician: Referring Rosette Bellavance: Treating Tinley Rought/Extender: Lucky Rathke in Treatment: 4 Wound Status Wound Number: 9 Primary Diabetic Wound/Ulcer of the Lower Extremity Etiology: Wound Location: Right T Second oe Wound Open Wounding Event: Gradually Appeared Status: Date Acquired: 07/15/2021 Comorbid Sleep Apnea, Arrhythmia, Congestive Heart Failure, Hypertension, Weeks Of Treatment: 4 History: Hypotension, Peripheral Arterial Disease, Peripheral Venous Clustered Wound: No Disease, Type II Diabetes, Gout, Osteoarthritis Wound Measurements Length: (cm) 0.4 Width: (cm) 0.6 Depth: (cm) 0.1 Area: (cm) 0.188 Volume: (cm) 0.019 % Reduction in Area: 71.5% % Reduction in Volume: 71.2% Epithelialization: Medium (34-66%) Tunneling: No Undermining: No Wound Description Classification: Grade 2 Wound Margin: Distinct, outline attached Exudate Amount: Medium Exudate Type: Serosanguineous Exudate Color: red, brown Foul Odor After Cleansing: No Slough/Fibrino Yes Wound Bed Granulation Amount: Large (67-100%) Exposed Structure Granulation Quality: Red, Pink Fascia Exposed: No Necrotic Amount: Small (1-33%) Fat Layer (Subcutaneous Tissue) Exposed: Yes Necrotic Quality: Adherent Slough Tendon Exposed: No Muscle Exposed: No Joint Exposed: No Bone Exposed: No Treatment Notes Wound #9 (Toe Second)  Wound Laterality: Right Cleanser Wound Cleanser Discharge Instruction: Cleanse the wound with wound cleanser prior to applying a clean dressing using gauze sponges, not tissue or cotton balls. Peri-Wound Care Topical keystone Discharge Instruction: Begin using topically to wounds once it gets sent to your house. Primary Dressing KerraCel Ag Gelling Fiber Dressing, 4x5 in (silver alginate) Discharge Instruction: Apply silver alginate to wound bed as instructed Secondary  Dressing Woven Gauze Sponges 2x2 in Discharge Instruction: Apply over primary dressing as directed. Secured With [employer] Surgical T 2x10 (in/yd) ape Discharge Instruction: Secure with tape as directed. Compression Wrap Compression Stockings Add-Ons Electronic Signature(s) Signed: 09/06/2021 5:12:03 PM By: Lorrin Jackson Entered By: Lorrin Jackson on 09/06/2021 15:35:16 -------------------------------------------------------------------------------- Vitals Details Patient Name: Date of Service: Benak, [name] 09/06/2021 3:30 PM Medical Record Number: [id number] Patient Account Number: [id number] Date of Birth/Sex: Treating RN: Jun 30, 1955 (66 y.o. [name] Primary Care Maximo Spratling: [name] Other Clinician: Referring Regenia Erck: Treating Venita Seng/Extender: Lucky Rathke in Treatment: 4 Vital Signs Time Taken: 15:29 Temperature (F): 98 Height (in): 71 Pulse (bpm): 82 Weight (lbs): 350 Respiratory Rate (breaths/min): 22 Body Mass Index (BMI): 48.8 Blood Pressure (mmHg): 133/77 Reference Range: 80 - 120 mg / dl Electronic Signature(s) Signed: 09/06/2021 5:12:03 PM By: Lorrin Jackson Entered By: Lorrin Jackson on 09/06/2021 15:31:52

## 2021-09-06 NOTE — Progress Notes (Signed)
[name] ([id number]) , Visit Report for 09/06/2021 SuperBill Details Patient Name: Date of Service: [name], [name] 09/06/2021 Medical Record Number: [id number] Patient Account Number: [id number] Date of Birth/Sex: Treating RN: Apr 24, 1955 (66 y.o. [name] Primary Care Provider: [name] Other Clinician: Referring Provider: Treating Provider/Extender: Lucky Rathke in Treatment: 4 Diagnosis Coding ICD-10 Codes Code Description I89.0 Lymphedema, not elsewhere classified I87.333 Chronic venous hypertension (idiopathic) with ulcer and inflammation of bilateral lower extremity L97.828 Non-pressure chronic ulcer of other part of left lower leg with other specified severity L97.818 Non-pressure chronic ulcer of other part of right lower leg with other specified severity E11.621 Type 2 diabetes mellitus with foot ulcer L97.518 Non-pressure chronic ulcer of other part of right foot with other specified severity Facility Procedures CPT4 Description Modifier Quantity Code [id number] BILATERAL: Application of multi-layer venous compression system; leg (below knee), including ankle and 1 foot. ICD-10 Diagnosis Description L97.828 Non-pressure chronic ulcer of other part of left lower leg with other specified severity L97.818 Non-pressure chronic ulcer of other part of right lower leg with other specified severity Electronic Signature(s) Signed: 09/06/2021 4:36:36 PM By: Kalman Shan DO Signed: 09/06/2021 5:12:03 PM By: Lorrin Jackson Entered By: Lorrin Jackson on 09/06/2021 15:37:14

## 2021-09-06 NOTE — Telephone Encounter (Signed)
Advanced Heart Failure Patient Advocate Encounter  Sent in BMS application via fax on 15/52. OOP included. Document scanned to chart.

## 2021-09-08 ENCOUNTER — Encounter (HOSPITAL_BASED_OUTPATIENT_CLINIC_OR_DEPARTMENT_OTHER): Payer: Medicare Other | Admitting: Physician Assistant

## 2021-09-08 DIAGNOSIS — L97828 Non-pressure chronic ulcer of other part of left lower leg with other specified severity: Secondary | ICD-10-CM | POA: Diagnosis not present

## 2021-09-08 DIAGNOSIS — L97812 Non-pressure chronic ulcer of other part of right lower leg with fat layer exposed: Secondary | ICD-10-CM | POA: Diagnosis not present

## 2021-09-08 DIAGNOSIS — I5022 Chronic systolic (congestive) heart failure: Secondary | ICD-10-CM | POA: Diagnosis not present

## 2021-09-08 DIAGNOSIS — Z87891 Personal history of nicotine dependence: Secondary | ICD-10-CM | POA: Diagnosis not present

## 2021-09-08 DIAGNOSIS — Z8572 Personal history of non-Hodgkin lymphomas: Secondary | ICD-10-CM | POA: Diagnosis not present

## 2021-09-08 DIAGNOSIS — G4730 Sleep apnea, unspecified: Secondary | ICD-10-CM | POA: Diagnosis not present

## 2021-09-08 DIAGNOSIS — I87333 Chronic venous hypertension (idiopathic) with ulcer and inflammation of bilateral lower extremity: Secondary | ICD-10-CM | POA: Diagnosis not present

## 2021-09-08 DIAGNOSIS — L97818 Non-pressure chronic ulcer of other part of right lower leg with other specified severity: Secondary | ICD-10-CM | POA: Diagnosis not present

## 2021-09-08 DIAGNOSIS — L97518 Non-pressure chronic ulcer of other part of right foot with other specified severity: Secondary | ICD-10-CM | POA: Diagnosis not present

## 2021-09-08 DIAGNOSIS — L97322 Non-pressure chronic ulcer of left ankle with fat layer exposed: Secondary | ICD-10-CM | POA: Diagnosis not present

## 2021-09-08 DIAGNOSIS — I110 Hypertensive heart disease with heart failure: Secondary | ICD-10-CM | POA: Diagnosis not present

## 2021-09-08 DIAGNOSIS — I890 Lymphedema, not elsewhere classified: Secondary | ICD-10-CM | POA: Diagnosis not present

## 2021-09-08 DIAGNOSIS — E11621 Type 2 diabetes mellitus with foot ulcer: Secondary | ICD-10-CM | POA: Diagnosis not present

## 2021-09-08 DIAGNOSIS — E1151 Type 2 diabetes mellitus with diabetic peripheral angiopathy without gangrene: Secondary | ICD-10-CM | POA: Diagnosis not present

## 2021-09-08 NOTE — Progress Notes (Signed)
[name] ([id number]) , Visit Report for 09/08/2021 Arrival Information Details Patient Name: Date of Service: [name], [name] 09/08/2021 2:15 PM Medical Record Number: [id number] Patient Account Number: [id number] Date of Birth/Sex: Treating RN: April 03, 1955 (66 y.o. [name], [name] Primary Care Ramirez Fullbright: [name] Other Clinician: Referring Aliene Tamura: Treating Consuello Lassalle/Extender: [name] in Treatment: 5 Visit Information History Since Last Visit Added or deleted any medications: Yes Patient Arrived: Wheel Chair Any new allergies or adverse reactions: No Arrival Time: 14:30 Had a fall or experienced change in No Accompanied By: friend activities of daily living that may affect Transfer Assistance: Manual risk of falls: Patient Identification Verified: Yes Hospitalized since last visit: Yes Secondary Verification Process Completed: Yes Implantable device outside of the clinic excluding No Patient Requires Transmission-Based Precautions: No cellular tissue based products placed in the center Patient Has Alerts: Yes since last visit: Patient Alerts: Patient on Blood Thinner Has Dressing in Place as Prescribed: Yes 01/2021 ABI L 1.13 R 1.06 Has Compression in Place as Prescribed: Yes 01/2021 TBI L amp R 0.8 Pain Present Now: No Electronic Signature(s) Signed: 09/08/2021 4:57:16 PM By: Deon Pilling RN, BSN Entered By: Deon Pilling on 09/08/2021 14:37:55 -------------------------------------------------------------------------------- Compression Therapy Details Patient Name: Date of Service: Koc, [name] [name] Merino D. 09/08/2021 2:15 PM Medical Record Number: [id number] Patient Account Number: [id number] Date of Birth/Sex: Treating RN: [birth year] (66 y.o. [name] Primary Care Jakoby Melendrez: [name] Other Clinician: Referring Lakrisha Iseman: Treating Nycole Kawahara/Extender: [name] in Treatment: 5 Compression Therapy Performed for  Wound Assessment: Wound #12 Left,Anterior Lower Leg Performed By: Clinician Deon Pilling, RN Compression Type: Three Layer Post Procedure Diagnosis Same as Pre-procedure Electronic Signature(s) Signed: 09/08/2021 4:57:16 PM By: Deon Pilling RN, BSN Entered By: Deon Pilling on 09/08/2021 15:22:26 -------------------------------------------------------------------------------- Compression Therapy Details Patient Name: Date of Service: Eslick, [name] [name] Merino D. 09/08/2021 2:15 PM Medical Record Number: [id number] Patient Account Number: [id number] Date of Birth/Sex: Treating RN: [birth year] (66 y.o. [name] Primary Care [name]: [name] Other Clinician: Referring [name]: Treating [name]/Extender: [name] in Treatment: 5 Compression Therapy Performed for Wound Assessment: Wound #7 Right,Medial Malleolus Performed By: Clinician Deon Pilling, RN Compression Type: Three Layer Post Procedure Diagnosis Same as Pre-procedure Electronic Signature(s) Signed: 09/08/2021 4:57:16 PM By: Deon Pilling RN, BSN Entered By: Deon Pilling on 09/08/2021 15:22:26 -------------------------------------------------------------------------------- Compression Therapy Details Patient Name: Date of Service: [name], [name] [name] Merino D. 09/08/2021 2:15 PM Medical Record Number: [id number] Patient Account Number: [id number] Date of Birth/Sex: Treating RN: [birth year] (66 y.o. [name] Primary Care [name]: [name] Other Clinician: Referring Kagan Mutchler: Treating [name]/Extender: [name] in Treatment: 5 Compression Therapy Performed for Wound Assessment: Wound #8 Right,Lateral Malleolus Performed By: Clinician Deon Pilling, RN Compression Type: Three Layer Post Procedure Diagnosis Same as Pre-procedure Electronic Signature(s) Signed: 09/08/2021 4:57:16 PM By: Deon Pilling RN, BSN Entered By: Deon Pilling on 09/08/2021  15:22:26 -------------------------------------------------------------------------------- Encounter Discharge Information Details Patient Name: Date of Service: [name], [name] [name] Merino D. 09/08/2021 2:15 PM Medical Record Number: [id number] Patient Account Number: [id number] Date of Birth/Sex: Treating RN: June 24, 1955 (66 y.o. [name] Primary Care [name]: [name] Other Clinician: Referring [name]: Treating Cornellius Kropp/Extender: [name] in Treatment: 5 Encounter Discharge Information Items Discharge Condition: Stable Ambulatory Status: Wheelchair Discharge Destination: Home Transportation: Private Auto Accompanied By: friend Schedule Follow-up Appointment: Yes Clinical Summary of Care: Electronic Signature(s) Signed: 09/08/2021 4:57:16 PM By: [name]  [name] RN, BSN Entered By: Deon Pilling on 09/08/2021 15:24:52 -------------------------------------------------------------------------------- Lower Extremity Assessment Details Patient Name: Date of Service: [name] 09/08/2021 2:15 PM Medical Record Number: [id number] Patient Account Number: [id number] Date of Birth/Sex: Treating RN: 25-Jun-1955 (66 y.o. [name] Primary Care Kizzi Overbey: [name] Other Clinician: Referring Mylasia Vorhees: Treating Drayk Humbarger/Extender: [name] in Treatment: 5 Edema Assessment Assessed: [name]: Yes] [name]: Yes] Edema: [Left: Yes] [Right: Yes] Calf Left: Right: Point of Measurement: 31 cm From Medial Instep 41 cm 40 cm Ankle Left: Right: Point of Measurement: 11 cm From Medial Instep 29 cm 27 cm Vascular Assessment Pulses: Dorsalis Pedis Palpable: [Left:Yes] [Right:Yes] Electronic Signature(s) Signed: 09/08/2021 4:57:16 PM By: Deon Pilling RN, BSN Entered By: Deon Pilling on 09/08/2021 14:50:33 -------------------------------------------------------------------------------- [place]  Details Patient Name: Date of Service: Mizell, [name] Huger D. 09/08/2021 2:15 PM Medical Record Number: [id number] Patient Account Number: [id number] Date of Birth/Sex: Treating RN: [birth year] (66 y.o. [name] Primary Care Annisha Baar: [name] Other Clinician: Referring Julie-Ann Vanmaanen: Treating Nussen Pullin/Extender: [name] in Treatment: 5 Multidisciplinary Care Plan reviewed with physician Active Inactive Pain, Acute or Chronic Nursing Diagnoses: Pain, acute or chronic: actual or potential Potential alteration in comfort, pain Goals: Patient will verbalize adequate pain control and receive pain control interventions during procedures as needed Date Initiated: 08/04/2021 Target Resolution Date: 10/08/2021 Goal Status: Active Patient/caregiver will verbalize comfort level met Date Initiated: 08/04/2021 Target Resolution Date: 09/10/2021 Goal Status: Active Interventions: Encourage patient to take pain medications as prescribed Provide education on pain management Reposition patient for comfort Treatment Activities: Administer pain control measures as ordered : 08/04/2021 Notes: Venous Leg Ulcer Nursing Diagnoses: Knowledge deficit related to disease process and management Goals: Non-invasive venous studies are completed as ordered Date Initiated: 08/04/2021 Date Inactivated: 08/25/2021 Target Resolution Date: 08/13/2021 Goal Status: Met Patient/caregiver will verbalize understanding of disease process and disease management Date Initiated: 08/04/2021 Target Resolution Date: 10/08/2021 Goal Status: Active Interventions: Assess peripheral edema status every visit. Compression as ordered Treatment Activities: Non-invasive vascular studies : 08/04/2021 Notes: Wound/Skin Impairment Nursing Diagnoses: Knowledge deficit related to ulceration/compromised skin integrity Goals: Patient/caregiver will verbalize understanding of skin care regimen Date Initiated:  08/04/2021 Target Resolution Date: 10/08/2021 Goal Status: Active Interventions: Assess patient/caregiver ability to perform ulcer/skin care regimen upon admission and as needed Assess ulceration(s) every visit Provide education on ulcer and skin care Treatment Activities: Skin care regimen initiated : 08/04/2021 Topical wound management initiated : 08/04/2021 Notes: Electronic Signature(s) Signed: 09/08/2021 4:57:16 PM By: Deon Pilling RN, BSN Entered By: Deon Pilling on 09/08/2021 15:20:13 -------------------------------------------------------------------------------- Pain Assessment Details Patient Name: Date of Service: Ahrendt, [name] [name] Merino D. 09/08/2021 2:15 PM Medical Record Number: [id number] Patient Account Number: [id number] Date of Birth/Sex: Treating RN: [birth year] (66 y.o. [name] Primary Care Wenda Vanschaick: Other Clinician: [name] Referring Farah Benish: Treating Gawain Crombie/Extender: [name] in Treatment: 5 Active Problems Location of Pain Severity and Description of Pain Patient Has Paino Yes Site Locations Pain Location: Generalized Pain, Pain in Ulcers Rate the pain. Current Pain Level: 8 Pain Management and Medication Current Pain Management: Medication: No Cold Application: No Rest: No Massage: No Activity: No T.E.N.S.: No Heat Application: No Leg drop or elevation: No Is the Current Pain Management Adequate: Adequate How does your wound impact your activities of daily livingo Sleep: No Bathing: No Appetite: No Relationship With Others: No Bladder Continence: No Emotions: No Bowel Continence: No Work: No Toileting:  No Drive: No Dressing: No Hobbies: No Notes right hand questioning gout pain per patient taking colchicine. per patient as a PRN order. Electronic Signature(s) Signed: 09/08/2021 4:57:16 PM By: Deon Pilling RN, BSN Entered By: Deon Pilling on 09/08/2021  14:39:13 -------------------------------------------------------------------------------- Patient/Caregiver Education Details Patient Name: Date of Service: [name] 8/30/2023andnbsp2:15 PM Medical Record Number: [id number] Patient Account Number: [id number] Date of Birth/Gender: Treating RN: June 28, 1955 (66 y.o. [name] Primary Care Physician: [name] Other Clinician: Referring Physician: Treating Physician/Extender: [name] in Treatment: 5 Education Assessment Education Provided To: Patient Education Topics Provided Wound/Skin Impairment: Handouts: Skin Care Do's and Dont's Methods: Explain/Verbal Responses: Reinforcements needed Electronic Signature(s) Signed: 09/08/2021 4:57:16 PM By: Deon Pilling RN, BSN Entered By: Deon Pilling on 09/08/2021 15:20:25 -------------------------------------------------------------------------------- Wound Assessment Details Patient Name: Date of Service: Chisholm, [name] [name] Merino D. 09/08/2021 2:15 PM Medical Record Number: [id number] Patient Account Number: [id number] Date of Birth/Sex: Treating RN: June 27, 1955 (66 y.o. [name], [name] Primary Care Derell Bruun: [name] Other Clinician: Referring Fenix Rorke: Treating Jaymes Hang/Extender: [name] in Treatment: 5 Wound Status Wound Number: 10 Primary Diabetic Wound/Ulcer of the Lower Extremity Etiology: Wound Location: Right T Third oe Wound Healed - Epithelialized Wounding Event: Gradually Appeared Status: Date Acquired: 07/16/2021 Comorbid Sleep Apnea, Arrhythmia, Congestive Heart Failure, Hypertension, Weeks Of Treatment: 5 History: Hypotension, Peripheral Arterial Disease, Peripheral Venous Clustered Wound: No Disease, Type II Diabetes, Gout, Osteoarthritis Photos Wound Measurements Length: (cm) Width: (cm) Depth: (cm) Area: (cm) Volume: (cm) 0 % Reduction in Area: 100% 0 % Reduction in Volume: 100% 0  Epithelialization: Large (67-100%) 0 Tunneling: No 0 Undermining: No Wound Description Classification: Grade 2 Wound Margin: Distinct, outline attached Exudate Amount: None Present Foul Odor After Cleansing: No Slough/Fibrino No Wound Bed Granulation Amount: None Present (0%) Exposed Structure Necrotic Amount: None Present (0%) Fascia Exposed: No Fat Layer (Subcutaneous Tissue) Exposed: No Tendon Exposed: No Muscle Exposed: No Joint Exposed: No Bone Exposed: No Electronic Signature(s) Signed: 09/08/2021 4:57:16 PM By: Deon Pilling RN, BSN Entered By: Deon Pilling on 09/08/2021 14:59:22 -------------------------------------------------------------------------------- Wound Assessment Details Patient Name: Date of Service: Novello, [name] [name] Merino D. 09/08/2021 2:15 PM Medical Record Number: [id number] Patient Account Number: [id number] Date of Birth/Sex: Treating RN: [birth year] (66 y.o. [name], [name] Primary Care Sundus Pete: [name] Other Clinician: Referring Lennyn Gange: Treating Wava Kildow/Extender: [name] in Treatment: 5 Wound Status Wound Number: 11 Primary Venous Leg Ulcer Etiology: Wound Location: Left, Medial Lower Leg Secondary Lymphedema Wounding Event: Gradually Appeared Etiology: Date Acquired: 07/22/2021 Wound Healed - Epithelialized Weeks Of Treatment: 5 Status: Clustered Wound: No Comorbid Sleep Apnea, Arrhythmia, Congestive Heart Failure, History: Hypertension, Hypotension, Peripheral Arterial Disease, Peripheral Venous Disease, Type II Diabetes, Gout, Osteoarthritis Photos Wound Measurements Length: (cm) Width: (cm) Depth: (cm) Area: (cm) Volume: (cm) 0 % Reduction in Area: 100% 0 % Reduction in Volume: 100% 0 Epithelialization: Large (67-100%) 0 Tunneling: No 0 Undermining: No Wound Description Classification: Full Thickness Without Exposed Support Structures Wound Margin: Distinct, outline attached Exudate Amount:  None Present Foul Odor After Cleansing: No Slough/Fibrino No Wound Bed Granulation Amount: None Present (0%) Exposed Structure Necrotic Amount: None Present (0%) Fascia Exposed: No Fat Layer (Subcutaneous Tissue) Exposed: No Tendon Exposed: No Muscle Exposed: No Joint Exposed: No Bone Exposed: No Electronic Signature(s) Signed: 09/08/2021 4:57:16 PM By: Deon Pilling RN, BSN Entered By: Deon Pilling on 09/08/2021 14:59:41 -------------------------------------------------------------------------------- Wound Assessment Details Patient Name: Date of Service: Rogerson, [name] NNY  D. 09/08/2021 2:15 PM Medical Record Number: [id number] Patient Account Number: [id number] Date of Birth/Sex: Treating RN: [birth year] (67 y.o. [name], [name] Primary Care Lanetta Figuero: [name] Other Clinician: Referring Avelynn Sellin: Treating Delonte Musich/Extender: [name] in Treatment: 5 Wound Status Wound Number: 12 Primary Venous Leg Ulcer Etiology: Wound Location: Left, Anterior Lower Leg Secondary Lymphedema Wounding Event: Gradually Appeared Etiology: Date Acquired: 07/15/2021 Wound Open Weeks Of Treatment: 5 Status: Clustered Wound: No Comorbid Sleep Apnea, Arrhythmia, Congestive Heart Failure, History: Hypertension, Hypotension, Peripheral Arterial Disease, Peripheral Venous Disease, Type II Diabetes, Gout, Osteoarthritis Photos Wound Measurements Length: (cm) 1.2 Width: (cm) 2.5 Depth: (cm) 0.1 Area: (cm) 2.356 Volume: (cm) 0.236 % Reduction in Area: 82.8% % Reduction in Volume: 82.7% Epithelialization: Medium (34-66%) Tunneling: No Undermining: No Wound Description Classification: Full Thickness Without Exposed Support Structures Wound Margin: Distinct, outline attached Exudate Amount: Large Exudate Type: Serosanguineous Exudate Color: red, brown Foul Odor After Cleansing: No Slough/Fibrino Yes Wound Bed Granulation Amount: Large (67-100%) Exposed  Structure Granulation Quality: Red, Pink Fascia Exposed: No Necrotic Amount: Small (1-33%) Fat Layer (Subcutaneous Tissue) Exposed: Yes Necrotic Quality: Adherent Slough Tendon Exposed: No Muscle Exposed: No Joint Exposed: No Bone Exposed: No Treatment Notes Wound #12 (Lower Leg) Wound Laterality: Left, Anterior Cleanser Peri-Wound Care Zinc Oxide Ointment 30g tube Discharge Instruction: Apply Zinc Oxide to periwound with each dressing change Sween Lotion (Moisturizing lotion) Discharge Instruction: Apply moisturizing lotion as directed Topical Keystone topical compounding antibiotics Discharge Instruction: apply directly to wound bed. Primary Dressing IODOFLEX 0.9% Cadexomer Iodine Pad 4x6 cm Discharge Instruction: Apply to wound bed as instructed Secondary Dressing ABD Pad, 8x10 Discharge Instruction: Apply over primary dressing as directed. CarboFLEX Odor Control Dressing, 4x4 in Discharge Instruction: Apply over primary dressing as directed. Zetuvit Plus 4x8 in Discharge Instruction: Apply over primary dressing as directed. Secured With Compression Wrap ThreePress (3 layer compression wrap) Discharge Instruction: Apply three layer compression as directed. Compression Stockings Add-Ons Electronic Signature(s) Signed: 09/08/2021 4:57:16 PM By: Deon Pilling RN, BSN Entered By: Deon Pilling on 09/08/2021 15:00:21 -------------------------------------------------------------------------------- Wound Assessment Details Patient Name: Date of Service: Chao, [name] 09/08/2021 2:15 PM Medical Record Number: [id number] Patient Account Number: [id number] Date of Birth/Sex: Treating RN: 1955/05/11 (66 y.o. [name], [name] Primary Care Marcellius Montagna: [name] Other Clinician: Referring Fatime Biswell: Treating Kevontae Burgoon/Extender: [name] in Treatment: 5 Wound Status Wound Number: 7 Primary Venous Leg Ulcer Etiology: Wound Location: Right, Medial  Malleolus Secondary Lymphedema Wounding Event: Blister Etiology: Date Acquired: 07/21/2021 Wound Open Weeks Of Treatment: 5 Status: Clustered Wound: No Comorbid Sleep Apnea, Arrhythmia, Congestive Heart Failure, History: Hypertension, Hypotension, Peripheral Arterial Disease, Peripheral Venous Disease, Type II Diabetes, Gout, Osteoarthritis Photos Wound Measurements Length: (cm) 6.5 Width: (cm) 6.8 Depth: (cm) 0.7 Area: (cm) 34.715 Volume: (cm) 24.3 % Reduction in Area: 9.3% % Reduction in Volume: 9.3% Epithelialization: Small (1-33%) Tunneling: No Undermining: No Wound Description Classification: Full Thickness With Exposed Support Structures Wound Margin: Distinct, outline attached Exudate Amount: Medium Exudate Type: Serosanguineous Exudate Color: red, brown Foul Odor After Cleansing: No Slough/Fibrino Yes Wound Bed Granulation Amount: Small (1-33%) Exposed Structure Granulation Quality: Red Fascia Exposed: No Necrotic Amount: Large (67-100%) Fat Layer (Subcutaneous Tissue) Exposed: Yes Necrotic Quality: Adherent Slough Tendon Exposed: No Muscle Exposed: No Joint Exposed: No Bone Exposed: No Treatment Notes Wound #7 (Malleolus) Wound Laterality: Right, Medial Cleanser Peri-Wound Care Zinc Oxide Ointment 30g tube Discharge Instruction: Apply Zinc Oxide to periwound with each dressing change Sween  Lotion (Moisturizing lotion) Discharge Instruction: Apply moisturizing lotion as directed Topical Keystone topical compounding antibiotics Discharge Instruction: apply directly to wound bed. Primary Dressing IODOFLEX 0.9% Cadexomer Iodine Pad 4x6 cm Discharge Instruction: Apply to wound bed as instructed Secondary Dressing ABD Pad, 8x10 Discharge Instruction: Apply over primary dressing as directed. CarboFLEX Odor Control Dressing, 4x4 in Discharge Instruction: Apply over primary dressing as directed. Zetuvit Plus 4x8 in Discharge Instruction: Apply over  primary dressing as directed. Secured With Compression Wrap ThreePress (3 layer compression wrap) Discharge Instruction: Apply three layer compression as directed. Compression Stockings Add-Ons Electronic Signature(s) Signed: 09/08/2021 4:57:16 PM By: Deon Pilling RN, BSN Entered By: Deon Pilling on 09/08/2021 15:00:41 -------------------------------------------------------------------------------- Wound Assessment Details Patient Name: Date of Service: Lengyel, [name] 09/08/2021 2:15 PM Medical Record Number: [id number] Patient Account Number: [id number] Date of Birth/Sex: Treating RN: 1955-10-06 (66 y.o. [name], [name] Primary Care Breslin Hemann: [name] Other Clinician: Referring Keona Sheffler: Treating Jakai Onofre/Extender: [name] in Treatment: 5 Wound Status Wound Number: 8 Primary Venous Leg Ulcer Etiology: Wound Location: Right, Lateral Malleolus Secondary Lymphedema Wounding Event: Gradually Appeared Etiology: Date Acquired: 07/22/2021 Wound Open Weeks Of Treatment: 5 Status: Clustered Wound: Yes Comorbid Sleep Apnea, Arrhythmia, Congestive Heart Failure, History: Hypertension, Hypotension, Peripheral Arterial Disease, Peripheral Venous Disease, Type II Diabetes, Gout, Osteoarthritis Photos Wound Measurements Length: (cm) 2 Width: (cm) 3.3 Depth: (cm) 0.2 Clustered Quantity: 1 Area: (cm) 5. Volume: (cm) 1. % Reduction in Area: 77.5% % Reduction in Volume: 77.5% Epithelialization: Medium (34-66%) Tunneling: No 184 Undermining: No 037 Wound Description Classification: Full Thickness Without Exposed Support Stru Wound Margin: Distinct, outline attached Exudate Amount: Medium Exudate Type: Serosanguineous Exudate Color: red, brown ctures Foul Odor After Cleansing: No Slough/Fibrino Yes Wound Bed Granulation Amount: Large (67-100%) Exposed Structure Granulation Quality: Red, Pink Fascia Exposed: No Necrotic Amount: Small  (1-33%) Fat Layer (Subcutaneous Tissue) Exposed: Yes Necrotic Quality: Adherent Slough Tendon Exposed: No Muscle Exposed: No Joint Exposed: No Bone Exposed: No Treatment Notes Wound #8 (Malleolus) Wound Laterality: Right, Lateral Cleanser Peri-Wound Care Zinc Oxide Ointment 30g tube Discharge Instruction: Apply Zinc Oxide to periwound with each dressing change Sween Lotion (Moisturizing lotion) Discharge Instruction: Apply moisturizing lotion as directed Topical Keystone topical compounding antibiotics Discharge Instruction: apply directly to wound bed. Primary Dressing IODOFLEX 0.9% Cadexomer Iodine Pad 4x6 cm Discharge Instruction: Apply to wound bed as instructed Secondary Dressing ABD Pad, 8x10 Discharge Instruction: Apply over primary dressing as directed. CarboFLEX Odor Control Dressing, 4x4 in Discharge Instruction: Apply over primary dressing as directed. Zetuvit Plus 4x8 in Discharge Instruction: Apply over primary dressing as directed. Secured With Compression Wrap ThreePress (3 layer compression wrap) Discharge Instruction: Apply three layer compression as directed. Compression Stockings Add-Ons Electronic Signature(s) Signed: 09/08/2021 4:57:16 PM By: Deon Pilling RN, BSN Entered By: Deon Pilling on 09/08/2021 15:02:21 -------------------------------------------------------------------------------- Wound Assessment Details Patient Name: Date of Service: Auxier, [name] 09/08/2021 2:15 PM Medical Record Number: [id number] Patient Account Number: [id number] Date of Birth/Sex: Treating RN: 1955/04/03 (66 y.o. [name] Primary Care Kavari Parrillo: [name] Other Clinician: Referring Rayn Shorb: Treating Franz Svec/Extender: [name] in Treatment: 5 Wound Status Wound Number: 9 Primary Diabetic Wound/Ulcer of the Lower Extremity Etiology: Wound Location: Right T Second oe Wound Healed - Epithelialized Wounding Event:  Gradually Appeared Status: Date Acquired: 07/15/2021 Comorbid Sleep Apnea, Arrhythmia, Congestive Heart Failure, Hypertension, Weeks Of Treatment: 5 History: Hypotension, Peripheral Arterial Disease, Peripheral Venous Clustered Wound: No Disease, Type II Diabetes,  Gout, Osteoarthritis Photos Wound Measurements Length: (cm) Width: (cm) Depth: (cm) Area: (cm) Volume: (cm) 0 % Reduction in Area: 100% 0 % Reduction in Volume: 100% 0 Epithelialization: Large (67-100%) 0 Tunneling: No 0 Undermining: No Wound Description Classification: Grade 2 Wound Margin: Distinct, outline attached Exudate Amount: None Present Wound Bed Granulation Amount: None Present (0%) Necrotic Amount: None Present (0%) Foul Odor After Cleansing: No Slough/Fibrino No Exposed Structure Fascia Exposed: No Fat Layer (Subcutaneous Tissue) Exposed: No Tendon Exposed: No Muscle Exposed: No Joint Exposed: No Bone Exposed: No Electronic Signature(s) Signed: 09/08/2021 4:57:16 PM By: Deon Pilling RN, BSN Entered By: Deon Pilling on 09/08/2021 15:00:02 -------------------------------------------------------------------------------- [place] Details Patient Name: Date of Service: Skeens, [name] 09/08/2021 2:15 PM Medical Record Number: [id number] Patient Account Number: [id number] Date of Birth/Sex: Treating RN: [birth year] (66 y.o. [name], [name] Primary Care Merrilee Ancona: [name] Other Clinician: Referring Lukka Black: Treating Anndee Connett/Extender: [name] in Treatment: 5 Vital Signs Time Taken: 14:30 Temperature (F): 98 Height (in): 71 Pulse (bpm): 74 Weight (lbs): 350 Respiratory Rate (breaths/min): 20 Body Mass Index (BMI): 48.8 Blood Pressure (mmHg): 119/68 Reference Range: 80 - 120 mg / dl Electronic Signature(s) Signed: 09/08/2021 4:57:16 PM By: Deon Pilling RN, BSN Entered By: Deon Pilling on 09/08/2021 14:38:11

## 2021-09-08 NOTE — Progress Notes (Addendum)
[name] ([id number]) , Visit Report for 09/08/2021 Chief Complaint Document Details Patient Name: Date of Service: [name], [name] 09/08/2021 2:15 PM Medical Record Number: [id number] Patient Account Number: [id number] Date of Birth/Sex: Treating RN: September 01, 1955 (66 y.o. M) Primary Care Provider: [name] Other Clinician: Referring Provider: Treating Provider/Extender: [name] in Treatment: 5 Information Obtained from: Patient Chief Complaint 08/04/2021; patient returns to clinic with bilateral leg wounds as well as areas on the right foot Electronic Signature(s) Signed: 09/08/2021 2:32:09 PM By: [name] PA-C Entered By: [name] on 09/08/2021 14:32:08 -------------------------------------------------------------------------------- HPI Details Patient Name: Date of Service: [name], [name] 09/08/2021 2:15 PM Medical Record Number: [id number] Patient Account Number: [id number] Date of Birth/Sex: Treating RN: 1955-03-18 (66 y.o. M) Primary Care Provider: [name] Other Clinician: Referring Provider: Treating Provider/Extender: [name] in Treatment: 5 History of Present Illness HPI Description: ADMISSION 03/22/2021 This is a 66 year old man with a past medical history significant for diabetes type 2, congestive heart failure, peripheral arterial disease, morbid obesity, venous insufficiency, and coronary artery disease. He has been followed by [name] in podiatry, who performed a transmetatarsal amputation on the left foot in August 2022. He had issues healing that wound, but based upon Dr. [name] notes, ultimately the TMA wound healed. During his recovery from that surgery, however, ulcers opened up over the DIP joint of the right second and third toe. These have apparently closed and reopened multiple times. It sounds like one of the issues has been moisture accumulation and maceration of the tissues  causing them to reopen. At his last visit with [name], on March 01, 2021, there continues to be problems with moisture and he was referred to wound care for further evaluation and management. He had a formal aortogram with runoff performed prior to his TMA. The findings are copied here: Patient has inline flow to both feet with no significant flow-limiting lesion that would be amenable to percutaneous or open revascularization. He does have an element of small vessel disease and has a short segment occlusion of the distal anterior tibial/dorsalis pedis artery on the left foot but does have posterior tibial artery flow. Would recommend management of wounds with amputation of toes 2 and 3 on the right foot if the wounds do not heal and deteriorate. Transmetatarsal amputation on the left side has as good a blood supply as it is going to get and hopefully this will heal in the future. Formal ABIs were done in January 2023. They are normal bilaterally. ABI Findings: +---------+------------------+-----+----------+--------+ Right Rt Pressure (mmHg)IndexWaveform Comment  +---------+------------------+-----+----------+--------+ Brachial 160     +---------+------------------+-----+----------+--------+ PTA 192 1.06 monophasic  +---------+------------------+-----+----------+--------+ DP 159 0.88 monophasic  +---------+------------------+-----+----------+--------+ Great T oe145 0.80    +---------+------------------+-----+----------+--------+ +--------+------------------+-----+---------+-------+ Left Lt Pressure (mmHg)IndexWaveform Comment +--------+------------------+-----+---------+-------+ [id number]     +--------+------------------+-----+---------+-------+ PTA 204 1.13 triphasic  +--------+------------------+-----+---------+-------+ DP 194 1.07 biphasic    +--------+------------------+-----+---------+-------+ +-------+-----------+-----------+------------+------------+ ABI/TBIT oday's ABIT oday's TBIPrevious ABIPrevious TBI +-------+-----------+-----------+------------+------------+ Right 1.06 0.8 1.26 0.65  +-------+-----------+-----------+------------+------------+ Left 1.13 amputation 1.15 amputation  +-------+-----------+-----------+------------+------------+ Previous ABI on 08/06/20 at [facility] Pedal pressures falsely elevated due to medial calcification. Summary: Right: Resting right ankle-brachial index is within normal range. The right toe-brachial index is normal. Left: Resting left ankle-brachial index is within normal range. READMISSION 08/04/2021 Mr. [name] is now a 66 year old man who I remember from this clinic many years ago I think he had a right lower extremity predominantly venous wound at  the time. He was here for 1 visit in March of this year had wounds on his right second and third toes we apparently dressed them many and they healed so he did not come back. He is listed in [place] is being a diabetic although the patient denies this says he is verified it with his primary doctor. In any case over the last several weeks or so according the patient although these wounds look somewhat more chronic than that he has developed predominantly large wounds on the right medial and right lateral ankle smaller areas on the left leg and areas on the dorsal aspect of his right second and third toes. Its not clear how he has been dressing these. 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 The patient has known PAD. He had an angiogram in August 2022 at which time he had nonhealing wounds in both feet. On the left his major vessels in the thigh were all patent. He had three-vessel patent to the level of the ankle. He had a very short  occlusion in the left anterior tibial. On the right lower extremity the major vessels in his thigh were all patent. He had three-vessel runoff to the foot sluggish filling of the anterior tibial artery. He was felt to have some component of small vessel disease but nothing that was amenable or needed revascularization. It was recommended that he have amputation of the second and third toes on the right foot if they did not heal He has been following with [name] of podiatry. [name] got him juxta lite stockings although I do not think he had them on properly he has uncontrolled edema in both legs Past medical history includes type 2 diabetes [although the patient really denies this], left TMA in 2022,lower extremity wounds in fact attendance at this clinic in 2009-2010, A-fib on Eliquis, chronic venous insufficiency with secondary lymphedema history of non-Hodgkin's lymphoma. 08-11-2021 upon evaluation today patient presents for follow-up evaluation he was seen last Wednesday initially for inspection here in our clinic. With that being said he tells me that he unfortunately has been having a lot of drainage and is actually coming through his wrap. Fortunately I do not see any evidence of active infection locally or systemically at this time which is great news. No fevers, chills, nausea, vomiting, or diarrhea. With that being said there does appear to be some evidence of local infection based on what I am seeing today. 08-18-2021 upon evaluation today patient appears to be doing okay currently in regard to his wounds with that being said that he is doing much better but still has a long ways to go to get to where he wanted to be. I think the infection is significantly improved. He has another week of the antibiotic at this point. 08-25-2021 upon evaluation today patient's wounds are actually doing decently well he has erythema has significantly improved. I think the cellulitis is under controlled I  am going to continue him on 2 more weeks of the Levaquin at 500 mg this is a lower dose but I am hoping it will be better for him. 09-08-2021 upon evaluation today patient appears to be doing excellent in regard to his wounds. Since I last saw him he was actually in the hospital where he ended up having a pacemaker put in. Subsequently he tells me that he is actually doing quite well although they were  unsure whether they were going to do it due to the fact that he had the wounds on his legs. And then I am glad they did anything seem to be doing well. Electronic Signature(s) Signed: 09/08/2021 3:48:59 PM By: [name] PA-C Entered By: [name] on 09/08/2021 15:48:58 -------------------------------------------------------------------------------- Physical Exam Details Patient Name: Date of Service: Cartwright, [name] [name] 09/08/2021 2:15 PM Medical Record Number: [id number] Patient Account Number: [id number] Date of Birth/Sex: Treating RN: [birth year] (66 y.o. M) Primary Care Provider: [name] Other Clinician: Referring Provider: Treating Provider/Extender: [name] in Treatment: 5 Constitutional Well-nourished and well-hydrated in no acute distress. Respiratory normal breathing without difficulty. Psychiatric this patient is able to make decisions and demonstrates good insight into disease process. Alert and Oriented x 3. pleasant and cooperative. Notes Upon inspection patient's wound bed actually showed signs of good granulation and epithelization across the board there was no need for sharp debridement today. Overall I am extremely pleased with where we stand currently. Electronic Signature(s) Signed: 09/08/2021 5:53:42 PM By: [name] PA-C Entered By: [name] on 09/08/2021 17:53:42 -------------------------------------------------------------------------------- Physician Orders Details Patient Name: Date of Service: Kirn, [name] [name]  09/08/2021 2:15 PM Medical Record Number: [id number] Patient Account Number: [id number] Date of Birth/Sex: Treating RN: Jul 07, 1955 (66 y.o. [name], [name] Primary Care Provider: [name] Other Clinician: Referring Provider: Treating Provider/Extender: [name] in Treatment: 5 Verbal / Phone Orders: No Diagnosis Coding ICD-10 Coding Code Description I89.0 Lymphedema, not elsewhere classified I87.333 Chronic venous hypertension (idiopathic) with ulcer and inflammation of bilateral lower extremity L97.828 Non-pressure chronic ulcer of other part of left lower leg with other specified severity L97.818 Non-pressure chronic ulcer of other part of right lower leg with other specified severity E11.621 Type 2 diabetes mellitus with foot ulcer L97.518 Non-pressure chronic ulcer of other part of right foot with other specified severity Follow-up Appointments ppointment in 1 week. Margarita Grizzle and [place], Room 8 [facility] Return A ppointment in 2 weeks. Margarita Grizzle and [place], Room 8 [facility] Return A Other: - [place] topical compounding antibiotics- bring to your appointments. Anesthetic (In clinic) Topical Lidocaine 5% applied to wound bed Bathing/ Shower/ Hygiene May shower with protection but do not get wound dressing(s) wet. Edema Control - Lymphedema / SCD / Other Elevate legs to the level of the heart or above for 30 minutes daily and/or when sitting, a frequency of: - 3-4 times a day throughout the day. Avoid standing for long periods of time. Exercise regularly Wound Treatment Wound #12 - Lower Leg Wound Laterality: Left, Anterior Peri-Wound Care: Zinc Oxide Ointment 30g tube 1 x Per Week/30 Days Discharge Instructions: Apply Zinc Oxide to periwound with each dressing change Peri-Wound Care: Sween Lotion (Moisturizing lotion) 1 x Per Week/30 Days Discharge Instructions: Apply moisturizing lotion as directed Topical: Keystone topical compounding  antibiotics 1 x Per Week/30 Days Discharge Instructions: apply directly to wound bed. Prim Dressing: IODOFLEX 0.9% Cadexomer Iodine Pad 4x6 cm 1 x Per Week/30 Days ary Discharge Instructions: Apply to wound bed as instructed Secondary Dressing: ABD Pad, 8x10 1 x Per Week/30 Days Discharge Instructions: Apply over primary dressing as directed. Secondary Dressing: CarboFLEX Odor Control Dressing, 4x4 in 1 x Per Week/30 Days Discharge Instructions: Apply over primary dressing as directed. Secondary Dressing: Zetuvit Plus 4x8 in 1 x Per Week/30 Days Discharge Instructions: Apply over primary dressing as directed. Compression Wrap: ThreePress (3 layer compression wrap) 1 x  Per Week/30 Days Discharge Instructions: Apply three layer compression as directed. Wound #7 - Malleolus Wound Laterality: Right, Medial Peri-Wound Care: Zinc Oxide Ointment 30g tube 1 x Per Week/30 Days Discharge Instructions: Apply Zinc Oxide to periwound with each dressing change Peri-Wound Care: Sween Lotion (Moisturizing lotion) 1 x Per Week/30 Days Discharge Instructions: Apply moisturizing lotion as directed Topical: Keystone topical compounding antibiotics 1 x Per Week/30 Days Discharge Instructions: apply directly to wound bed. Prim Dressing: IODOFLEX 0.9% Cadexomer Iodine Pad 4x6 cm 1 x Per Week/30 Days ary Discharge Instructions: Apply to wound bed as instructed Secondary Dressing: ABD Pad, 8x10 1 x Per Week/30 Days Discharge Instructions: Apply over primary dressing as directed. Secondary Dressing: CarboFLEX Odor Control Dressing, 4x4 in 1 x Per Week/30 Days Discharge Instructions: Apply over primary dressing as directed. Secondary Dressing: Zetuvit Plus 4x8 in 1 x Per Week/30 Days Discharge Instructions: Apply over primary dressing as directed. Compression Wrap: ThreePress (3 layer compression wrap) 1 x Per Week/30 Days Discharge Instructions: Apply three layer compression as directed. Wound #8 - Malleolus  Wound Laterality: Right, Lateral Peri-Wound Care: Zinc Oxide Ointment 30g tube 1 x Per Week/30 Days Discharge Instructions: Apply Zinc Oxide to periwound with each dressing change Peri-Wound Care: Sween Lotion (Moisturizing lotion) 1 x Per Week/30 Days Discharge Instructions: Apply moisturizing lotion as directed Topical: Keystone topical compounding antibiotics 1 x Per Week/30 Days Discharge Instructions: apply directly to wound bed. Prim Dressing: IODOFLEX 0.9% Cadexomer Iodine Pad 4x6 cm 1 x Per Week/30 Days ary Discharge Instructions: Apply to wound bed as instructed Secondary Dressing: ABD Pad, 8x10 1 x Per Week/30 Days Discharge Instructions: Apply over primary dressing as directed. Secondary Dressing: CarboFLEX Odor Control Dressing, 4x4 in 1 x Per Week/30 Days Discharge Instructions: Apply over primary dressing as directed. Secondary Dressing: Zetuvit Plus 4x8 in 1 x Per Week/30 Days Discharge Instructions: Apply over primary dressing as directed. Compression Wrap: ThreePress (3 layer compression wrap) 1 x Per Week/30 Days Discharge Instructions: Apply three layer compression as directed. Electronic Signature(s) Signed: 09/08/2021 4:57:16 PM By: Deon Pilling RN, BSN Signed: 09/08/2021 6:03:16 PM By: [name] PA-C Entered By: Deon Pilling on 09/08/2021 15:23:38 -------------------------------------------------------------------------------- Problem List Details Patient Name: Date of Service: [name], [name] Huger D. 09/08/2021 2:15 PM Medical Record Number: [id number] Patient Account Number: [id number] Date of Birth/Sex: Treating RN: 06-02-1955 (66 y.o. M) Primary Care Provider: [name] Other Clinician: Referring Provider: Treating Provider/Extender: [name] in Treatment: 5 Active Problems ICD-10 Encounter Code Description Active Date MDM Diagnosis I89.0 Lymphedema, not elsewhere classified 08/04/2021 No Yes I87.333 Chronic venous  hypertension (idiopathic) with ulcer and inflammation of 08/04/2021 No Yes bilateral lower extremity L97.828 Non-pressure chronic ulcer of other part of left lower leg with other specified 08/04/2021 No Yes severity L97.818 Non-pressure chronic ulcer of other part of right lower leg with other specified 08/04/2021 No Yes severity E11.621 Type 2 diabetes mellitus with foot ulcer 08/04/2021 No Yes L97.518 Non-pressure chronic ulcer of other part of right foot with other specified 08/04/2021 No Yes severity Inactive Problems Resolved Problems Electronic Signature(s) Signed: 09/08/2021 2:31:45 PM By: [name] PA-C Entered By: [name] on 09/08/2021 14:31:44 -------------------------------------------------------------------------------- Progress Note Details Patient Name: Date of Service: [name], [name] [name] 09/08/2021 2:15 PM Medical Record Number: [id number] Patient Account Number: [id number] Date of Birth/Sex: Treating RN: [birth year] (66 y.o. M) Primary Care Provider: [name] Other Clinician: Referring Provider: Treating Provider/Extender: [name] in Treatment:  5 Subjective Chief Complaint Information obtained from Patient 08/04/2021; patient returns to clinic with bilateral leg wounds as well as areas on the right foot History of Present Illness (HPI) ADMISSION 03/22/2021 This is a 66 year old man with a past medical history significant for diabetes type 2, congestive heart failure, peripheral arterial disease, morbid obesity, venous insufficiency, and coronary artery disease. He has been followed by [name] in podiatry, who performed a transmetatarsal amputation on the left foot in August 2022. He had issues healing that wound, but based upon Dr. [name] notes, ultimately the TMA wound healed. During his recovery from that surgery, however, ulcers opened up over the DIP joint of the right second and third toe. These have apparently closed  and reopened multiple times. It sounds like one of the issues has been moisture accumulation and maceration of the tissues causing them to reopen. At his last visit with [name], on March 01, 2021, there continues to be problems with moisture and he was referred to wound care for further evaluation and management. He had a formal aortogram with runoff performed prior to his TMA. The findings are copied here: Patient has inline flow to both feet with no significant flow-limiting lesion that would be amenable to percutaneous or open revascularization. He does have an element of small vessel disease and has a short segment occlusion of the distal anterior tibial/dorsalis pedis artery on the left foot but does have posterior tibial artery flow. Would recommend management of wounds with amputation of toes 2 and 3 on the right foot if the wounds do not heal and deteriorate. Transmetatarsal amputation on the left side has as good a blood supply as it is going to get and hopefully this will heal in the future. Formal ABIs were done in January 2023. They are normal bilaterally. ABI Findings: +---------+------------------+-----+----------+--------+ Right Rt Pressure (mmHg)IndexWaveform Comment  +---------+------------------+-----+----------+--------+ Brachial 160    +---------+------------------+-----+----------+--------+ PTA 192 1.06 monophasic  +---------+------------------+-----+----------+--------+ DP 159 0.88 monophasic  +---------+------------------+-----+----------+--------+ Great T oe145 0.80    +---------+------------------+-----+----------+--------+ +--------+------------------+-----+---------+-------+ Left Lt Pressure (mmHg)IndexWaveform Comment +--------+------------------+-----+---------+-------+ [id number]    +--------+------------------+-----+---------+-------+ PTA 204 1.13 triphasic   +--------+------------------+-----+---------+-------+ DP 194 1.07 biphasic   +--------+------------------+-----+---------+-------+ +-------+-----------+-----------+------------+------------+ ABI/TBIT oday's ABIT oday's TBIPrevious ABIPrevious TBI +-------+-----------+-----------+------------+------------+ Right 1.06 0.8 1.26 0.65  +-------+-----------+-----------+------------+------------+ Left 1.13 amputation 1.15 amputation  +-------+-----------+-----------+------------+------------+ Previous ABI on 08/06/20 at [facility] Pedal pressures falsely elevated due to medial calcification. Summary: Right: Resting right ankle-brachial index is within normal range. The right toe-brachial index is normal. Left: Resting left ankle-brachial index is within normal range. READMISSION 08/04/2021 Mr. Carrigan is now a 66 year old man who I remember from this clinic many years ago I think he had a right lower extremity predominantly venous wound at the time. He was here for 1 visit in March of this year had wounds on his right second and third toes we apparently dressed them many and they healed so he did not come back. He is listed in [place] is being a diabetic although the patient denies this says he is verified it with his primary doctor. In any case over the last several weeks or so according the patient although these wounds look somewhat more chronic than that he has developed predominantly large wounds on the right medial and right lateral ankle smaller areas on the left leg and areas on the dorsal aspect of his right second and third toes. Its not clear how he has been dressing these. 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 The patient has known PAD. He had an angiogram in August 2022 at which time he had nonhealing wounds in both feet. On the left his major vessels in the thigh were all  patent. He had three-vessel patent to the level of the ankle. He had a very short occlusion in the left anterior tibial. On the right lower extremity the major vessels in his thigh were all patent. He had three-vessel runoff to the foot sluggish filling of the anterior tibial artery. He was felt to have some component of small vessel disease but nothing that was amenable or needed revascularization. It was recommended that he have amputation of the second and third toes on the right foot if they did not heal He has been following with [name] of podiatry. [name] got him juxta lite stockings although I do not think he had them on properly he has uncontrolled edema in both legs Past medical history includes type 2 diabetes [although the patient really denies this], left TMA in 2022,lower extremity wounds in fact attendance at this clinic in 2009-2010, A-fib on Eliquis, chronic venous insufficiency with secondary lymphedema history of non-Hodgkin's lymphoma. 08-11-2021 upon evaluation today patient presents for follow-up evaluation he was seen last Wednesday initially for inspection here in our clinic. With that being said he tells me that he unfortunately has been having a lot of drainage and is actually coming through his wrap. Fortunately I do not see any evidence of active infection locally or systemically at this time which is great news. No fevers, chills, nausea, vomiting, or diarrhea. With that being said there does appear to be some evidence of local infection based on what I am seeing today. 08-18-2021 upon evaluation today patient appears to be doing okay currently in regard to his wounds with that being said that he is doing much better but still has a long ways to go to get to where he wanted to be. I think the infection is significantly improved. He has another week of the antibiotic at this point. 08-25-2021 upon evaluation today patient's wounds are actually doing decently well he has  erythema has significantly improved. I think the cellulitis is under controlled I am going to continue him on 2 more weeks of the Levaquin at 500 mg this is a lower dose but I am hoping it will be better for him. 09-08-2021 upon evaluation today patient appears to be doing excellent in regard to his wounds. Since I last saw him he was actually in the hospital where he ended up having a pacemaker put in. Subsequently he tells me that he is actually doing quite well although they were unsure whether they were going to do it due to the fact that he had the wounds on his legs. And then I am glad they did anything seem to be doing well. Patient History Information obtained from Patient, Chart. Family History Hypertension - Mother, Stroke - Siblings. Social History Former smoker - quit - ended on 03/24/2013, Marital Status - Divorced, Alcohol Use - Rarely, Drug Use - No History, Caffeine Use - Daily - Tea. Medical History Respiratory Patient has history of Sleep Apnea - wears CPAP Cardiovascular Patient has history of Arrhythmia - Atrial Fibrillation, Congestive Heart Failure, Hypertension, Hypotension, Peripheral Arterial Disease, Peripheral Venous Disease Endocrine Patient has history of Type II Diabetes Denies history of Type I Diabetes Musculoskeletal Patient has history of Gout, Osteoarthritis - Hand Hospitalization/Surgery History -  07/2020 left transmet. - 08/2020 aortogram. - Pacemaker 08/30/2021. Medical A Surgical History Notes nd Hematologic/Lymphatic 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 Respiratory ARDS, Recurrent pneumonia, hypoxic pulmonary HTN Cardiovascular Acute/systolic (congestive) heart failure, cardiomyopathy, type 2 diabetes mellitus with peripheral angiopathy Gastrointestinal Dysphagia Genitourinary UTI due to extended-spectrum beta lactamase (ESBL) producing Escherichia coli Integumentary (Skin) Psoriasis Oncologic Non Hodgkins  Lymphoma Objective Constitutional Well-nourished and well-hydrated in no acute distress. Vitals Time Taken: 2:30 PM, Height: 71 in, Weight: 350 lbs, BMI: 48.8, Temperature: 98 F, Pulse: 74 bpm, Respiratory Rate: 20 breaths/min, Blood Pressure: 119/68 mmHg. Respiratory normal breathing without difficulty. Psychiatric this patient is able to make decisions and demonstrates good insight into disease process. Alert and Oriented x 3. pleasant and cooperative. General Notes: Upon inspection patient's wound bed actually showed signs of good granulation and epithelization across the board there was no need for sharp debridement today. Overall I am extremely pleased with where we stand currently. Integumentary (Hair, Skin) Wound #10 status is Healed - Epithelialized. Original cause of wound was Gradually Appeared. The date acquired was: 07/16/2021. The wound has been in treatment 5 weeks. The wound is located on the Right T Third. The wound measures 0cm length x 0cm width x 0cm depth; 0cm^2 area and 0cm^3 volume. oe There is no tunneling or undermining noted. There is a none present amount of drainage noted. The wound margin is distinct with the outline attached to the wound base. There is no granulation within the wound bed. There is no necrotic tissue within the wound bed. Wound #11 status is Healed - Epithelialized. Original cause of wound was Gradually Appeared. The date acquired was: 07/22/2021. The wound has been in treatment 5 weeks. The wound is located on the Left,Medial Lower Leg. The wound measures 0cm length x 0cm width x 0cm depth; 0cm^2 area and 0cm^3 volume. There is no tunneling or undermining noted. There is a none present amount of drainage noted. The wound margin is distinct with the outline attached to the wound base. There is no granulation within the wound bed. There is no necrotic tissue within the wound bed. Wound #12 status is Open. Original cause of wound was Gradually Appeared.  The date acquired was: 07/15/2021. The wound has been in treatment 5 weeks. The wound is located on the Left,Anterior Lower Leg. The wound measures 1.2cm length x 2.5cm width x 0.1cm depth; 2.356cm^2 area and 0.236cm^3 volume. There is Fat Layer (Subcutaneous Tissue) exposed. There is no tunneling or undermining noted. There is a large amount of serosanguineous drainage noted. The wound margin is distinct with the outline attached to the wound base. There is large (67-100%) red, pink granulation within the wound bed. There is a small (1-33%) amount of necrotic tissue within the wound bed including Adherent Slough. Wound #7 status is Open. Original cause of wound was Blister. The date acquired was: 07/21/2021. The wound has been in treatment 5 weeks. The wound is located on the Right,Medial Malleolus. The wound measures 6.5cm length x 6.8cm width x 0.7cm depth; 34.715cm^2 area and 24.3cm^3 volume. There is Fat Layer (Subcutaneous Tissue) exposed. There is no tunneling or undermining noted. There is a medium amount of serosanguineous drainage noted. The wound margin is distinct with the outline attached to the wound base. There is small (1-33%) red granulation within the wound bed. There is a large (67-100%) amount of necrotic tissue within the wound bed including Adherent Slough. Wound #8 status is Open. Original cause of wound was Gradually  Appeared. The date acquired was: 07/22/2021. The wound has been in treatment 5 weeks. The wound is located on the Right,Lateral Malleolus. The wound measures 2cm length x 3.3cm width x 0.2cm depth; 5.184cm^2 area and 1.037cm^3 volume. There is Fat Layer (Subcutaneous Tissue) exposed. There is no tunneling or undermining noted. There is a medium amount of serosanguineous drainage noted. The wound margin is distinct with the outline attached to the wound base. There is large (67-100%) red, pink granulation within the wound bed. There is a small (1- 33%) amount of necrotic  tissue within the wound bed including Adherent Slough. Wound #9 status is Healed - Epithelialized. Original cause of wound was Gradually Appeared. The date acquired was: 07/15/2021. The wound has been in treatment 5 weeks. The wound is located on the Right T Second. The wound measures 0cm length x 0cm width x 0cm depth; 0cm^2 area and 0cm^3 volume. oe There is no tunneling or undermining noted. There is a none present amount of drainage noted. The wound margin is distinct with the outline attached to the wound base. There is no granulation within the wound bed. There is no necrotic tissue within the wound bed. 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 There was a Three Layer Compression Therapy Procedure by Deon Pilling, RN. Post procedure Diagnosis Wound #12: Same as Pre-Procedure Wound #7 Pre-procedure diagnosis of Wound #7 is a Venous Leg Ulcer located on the Right,Medial Malleolus . There was a Three Layer Compression Therapy Procedure by Deon Pilling, RN. Post procedure Diagnosis Wound #7: Same as Pre-Procedure Wound #8 Pre-procedure diagnosis of Wound #8 is a Venous Leg Ulcer located on the Right,Lateral Malleolus . There was a Three Layer Compression Therapy Procedure by Deon Pilling, RN. Post procedure Diagnosis Wound #8: Same as Pre-Procedure Plan Follow-up Appointments: Return Appointment in 1 week. Margarita Grizzle and [place], Room 8  [facility] Return Appointment in 2 weeks. Margarita Grizzle and [facility], Room 8 [facility] Other: - [place] topical compounding antibiotics- bring to your appointments. Anesthetic: (In clinic) Topical Lidocaine 5% applied to wound bed Bathing/ Shower/ Hygiene: May shower with protection but do not get wound dressing(s) wet. Edema Control - Lymphedema / SCD / Other: Elevate legs to the level of the heart or above for 30 minutes daily and/or when sitting, a frequency of: - 3-4 times a day throughout the day. Avoid standing for long periods of time. Exercise regularly WOUND #12: - Lower Leg Wound Laterality: Left, Anterior Peri-Wound Care: Zinc Oxide Ointment 30g tube 1 x Per Week/30 Days Discharge Instructions: Apply Zinc Oxide to periwound with each dressing change Peri-Wound Care: Sween Lotion (Moisturizing lotion) 1 x Per Week/30 Days Discharge Instructions: Apply moisturizing lotion as directed Topical: Keystone topical compounding antibiotics 1 x Per Week/30 Days Discharge Instructions: apply directly to wound bed. Prim Dressing: IODOFLEX 0.9% Cadexomer Iodine Pad 4x6 cm 1 x Per Week/30 Days ary Discharge Instructions: Apply to wound bed as instructed Secondary Dressing: ABD Pad, 8x10 1 x Per Week/30 Days Discharge Instructions: Apply over primary dressing  as directed. Secondary Dressing: CarboFLEX Odor Control Dressing, 4x4 in 1 x Per Week/30 Days Discharge Instructions: Apply over primary dressing as directed. Secondary Dressing: Zetuvit Plus 4x8 in 1 x Per Week/30 Days Discharge Instructions: Apply over primary dressing as directed. Com pression Wrap: ThreePress (3 layer compression wrap) 1 x Per Week/30 Days Discharge Instructions: Apply three layer compression as directed. WOUND #7: - Malleolus Wound Laterality: Right, Medial Peri-Wound Care: Zinc Oxide Ointment 30g tube 1 x Per Week/30 Days Discharge Instructions: Apply Zinc Oxide to periwound with each dressing change Peri-Wound  Care: Sween Lotion (Moisturizing lotion) 1 x Per Week/30 Days Discharge Instructions: Apply moisturizing lotion as directed Topical: Keystone topical compounding antibiotics 1 x Per Week/30 Days Discharge Instructions: apply directly to wound bed. Prim Dressing: IODOFLEX 0.9% Cadexomer Iodine Pad 4x6 cm 1 x Per Week/30 Days ary Discharge Instructions: Apply to wound bed as instructed Secondary Dressing: ABD Pad, 8x10 1 x Per Week/30 Days Discharge Instructions: Apply over primary dressing as directed. Secondary Dressing: CarboFLEX Odor Control Dressing, 4x4 in 1 x Per Week/30 Days Discharge Instructions: Apply over primary dressing as directed. Secondary Dressing: Zetuvit Plus 4x8 in 1 x Per Week/30 Days Discharge Instructions: Apply over primary dressing as directed. Com pression Wrap: ThreePress (3 layer compression wrap) 1 x Per Week/30 Days Discharge Instructions: Apply three layer compression as directed. WOUND #8: - Malleolus Wound Laterality: Right, Lateral Peri-Wound Care: Zinc Oxide Ointment 30g tube 1 x Per Week/30 Days Discharge Instructions: Apply Zinc Oxide to periwound with each dressing change Peri-Wound Care: Sween Lotion (Moisturizing lotion) 1 x Per Week/30 Days Discharge Instructions: Apply moisturizing lotion as directed Topical: Keystone topical compounding antibiotics 1 x Per Week/30 Days Discharge Instructions: apply directly to wound bed. Prim Dressing: IODOFLEX 0.9% Cadexomer Iodine Pad 4x6 cm 1 x Per Week/30 Days ary Discharge Instructions: Apply to wound bed as instructed Secondary Dressing: ABD Pad, 8x10 1 x Per Week/30 Days Discharge Instructions: Apply over primary dressing as directed. Secondary Dressing: CarboFLEX Odor Control Dressing, 4x4 in 1 x Per Week/30 Days Discharge Instructions: Apply over primary dressing as directed. Secondary Dressing: Zetuvit Plus 4x8 in 1 x Per Week/30 Days Discharge Instructions: Apply over primary dressing as  directed. Com pression Wrap: ThreePress (3 layer compression wrap) 1 x Per Week/30 Days Discharge Instructions: Apply three layer compression as directed. 1. I would recommend that we go ahead and continue with the recommendation for the wound care measures as before. We are continuing currently with the zinc around the edges of the wound were using as well the Iodoflex which I think is doing a good job keeping this nice and clean. By next week I think we should be able to switch over to something different. 2. I am also can recommend that we have the patient continue with the 3 layer compression wrap which is doing quite well. We will see patient back for reevaluation in 1 week here in the clinic. If anything worsens or changes patient will contact our office for additional recommendations. Electronic Signature(s) Signed: 09/08/2021 5:54:15 PM By: [name] PA-C Entered By: [name] on 09/08/2021 17:54:15 -------------------------------------------------------------------------------- HxROS Details Patient Name: Date of Service: [name], [name] 09/08/2021 2:15 PM Medical Record Number: [id number] Patient Account Number: [id number] Date of Birth/Sex: Treating RN: 1955/06/06 (66 y.o. 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 Respiratory Medical History: Positive for: Sleep Apnea - wears CPAP Past Medical History Notes: ARDS, Recurrent pneumonia, hypoxic pulmonary HTN Cardiovascular Medical History: Positive for: Arrhythmia - Atrial Fibrillation; Congestive Heart Failure; Hypertension; Hypotension; Peripheral Arterial Disease; Peripheral Venous Disease Past Medical History  Notes: Acute/systolic (congestive) heart failure, cardiomyopathy, type 2 diabetes mellitus with peripheral angiopathy Gastrointestinal Medical History: Past Medical History Notes: Dysphagia Endocrine Medical History: Positive for: Type II Diabetes Negative for: Type I Diabetes Treated with: Diet Blood sugar tested every day: No Genitourinary Medical History: Past Medical History Notes: UTI due to extended-spectrum beta lactamase (ESBL) producing Escherichia coli Integumentary (Skin) Medical History: Past Medical History Notes: Psoriasis Musculoskeletal Medical History: Positive for: Gout; Osteoarthritis - Hand Oncologic Medical History: Past Medical History Notes: Non Hodgkins Lymphoma Immunizations Pneumococcal Vaccine: Received Pneumococcal Vaccination: Yes Received Pneumococcal Vaccination On or After 60th Birthday: No Implantable Devices No devices added Hospitalization / Surgery History Type of Hospitalization/Surgery 07/2020 left transmet 08/2020 aortogram Pacemaker 08/30/2021 Family and Social History Hypertension: Yes - Mother; Stroke: Yes - Siblings; Former smoker - quit - ended on 03/24/2013; Marital Status - Divorced; Alcohol Use: Rarely; Drug Use: No History; Caffeine Use: Daily - T Financial Concerns: No; Food, Clothing or Shelter Needs: No; Support System Lacking: No; 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 09/08/2021 Medical Record Number: [id number] Patient Account Number: [id number] Date of Birth/Sex: Treating RN: 09-27-1955 (66 y.o. [name], [name] Primary Care Provider: [name] Other Clinician: Referring Provider: Treating  Provider/Extender: [name] in Treatment: 5 Diagnosis Coding ICD-10 Codes Code Description I89.0 Lymphedema, not elsewhere classified I87.333 Chronic venous hypertension (idiopathic) with ulcer and inflammation of bilateral lower extremity L97.828 Non-pressure chronic ulcer of other part of left lower leg with other specified severity L97.818 Non-pressure chronic ulcer of other part of right lower leg with other specified severity E11.621 Type 2 diabetes mellitus with foot ulcer L97.518 Non-pressure chronic ulcer of other part of right foot with other specified severity Facility Procedures CPT4: Code [id number] Description: 81 BILATERAL: Application of multi-layer venous compression system; leg (below knee), including ankle and t. Modifier: Quantity: 1 Physician Procedures : CPT4 Code Description Modifier [id number] - WC PHYS LEVEL 3 - EST PT ICD-10 Diagnosis Description I89.0 Lymphedema, not elsewhere classified I87.333 Chronic venous hypertension (idiopathic) with ulcer and inflammation of bilateral lower extremity  L97.828 Non-pressure chronic ulcer of other part of left lower leg with other specified severity L97.818 Non-pressure chronic ulcer of other part of right lower leg with other specified severity Quantity: 1 Electronic Signature(s) Signed: 09/08/2021 5:54:45 PM By: [name] PA-C Previous Signature: 09/08/2021 4:57:16 PM Version By: Deon Pilling RN, BSN Entered By: [name] on 09/08/2021 17:54:45

## 2021-09-14 ENCOUNTER — Other Ambulatory Visit (HOSPITAL_COMMUNITY): Payer: Self-pay

## 2021-09-14 MED ORDER — ENTRESTO 24-26 MG PO TABS: 1.0000 | ORAL_TABLET | Freq: Two times a day (BID) | ORAL | 3 refills | Status: DC

## 2021-09-15 ENCOUNTER — Ambulatory Visit: Payer: Medicare Other | Attending: Cardiology

## 2021-09-15 ENCOUNTER — Telehealth (HOSPITAL_COMMUNITY): Payer: Self-pay | Admitting: Pharmacy Technician

## 2021-09-15 ENCOUNTER — Encounter (HOSPITAL_BASED_OUTPATIENT_CLINIC_OR_DEPARTMENT_OTHER): Payer: Medicare Other | Attending: Physician Assistant | Admitting: Physician Assistant

## 2021-09-15 DIAGNOSIS — L97312 Non-pressure chronic ulcer of right ankle with fat layer exposed: Secondary | ICD-10-CM | POA: Diagnosis not present

## 2021-09-15 DIAGNOSIS — I442 Atrioventricular block, complete: Secondary | ICD-10-CM | POA: Diagnosis not present

## 2021-09-15 DIAGNOSIS — Z6841 Body Mass Index (BMI) 40.0 and over, adult: Secondary | ICD-10-CM | POA: Insufficient documentation

## 2021-09-15 DIAGNOSIS — Z89432 Acquired absence of left foot: Secondary | ICD-10-CM | POA: Insufficient documentation

## 2021-09-15 DIAGNOSIS — L97828 Non-pressure chronic ulcer of other part of left lower leg with other specified severity: Secondary | ICD-10-CM | POA: Diagnosis not present

## 2021-09-15 DIAGNOSIS — I890 Lymphedema, not elsewhere classified: Secondary | ICD-10-CM | POA: Insufficient documentation

## 2021-09-15 DIAGNOSIS — L97518 Non-pressure chronic ulcer of other part of right foot with other specified severity: Secondary | ICD-10-CM | POA: Diagnosis not present

## 2021-09-15 DIAGNOSIS — E11621 Type 2 diabetes mellitus with foot ulcer: Secondary | ICD-10-CM | POA: Insufficient documentation

## 2021-09-15 DIAGNOSIS — I872 Venous insufficiency (chronic) (peripheral): Secondary | ICD-10-CM | POA: Diagnosis not present

## 2021-09-15 DIAGNOSIS — I87333 Chronic venous hypertension (idiopathic) with ulcer and inflammation of bilateral lower extremity: Secondary | ICD-10-CM | POA: Diagnosis not present

## 2021-09-15 DIAGNOSIS — E1151 Type 2 diabetes mellitus with diabetic peripheral angiopathy without gangrene: Secondary | ICD-10-CM | POA: Diagnosis not present

## 2021-09-15 DIAGNOSIS — L97818 Non-pressure chronic ulcer of other part of right lower leg with other specified severity: Secondary | ICD-10-CM | POA: Insufficient documentation

## 2021-09-15 DIAGNOSIS — I5022 Chronic systolic (congestive) heart failure: Secondary | ICD-10-CM

## 2021-09-15 LAB — CUP PACEART INCLINIC DEVICE CHECK
Battery Remaining Longevity: 99 mo
Battery Voltage: 3.13 V
Brady Statistic AP VP Percent: 98.66 %
Brady Statistic AP VS Percent: 0.01 %
Brady Statistic AS VP Percent: 0.24 %
Brady Statistic AS VS Percent: 1.08 %
Brady Statistic RA Percent Paced: 83.8 %
Brady Statistic RV Percent Paced: 79.58 %
Date Time Interrogation Session: 20230906090000
HighPow Impedance: 74 Ohm
Implantable Lead Implant Date: 20230823
Implantable Lead Implant Date: 20230823
Implantable Lead Location: 753859
Implantable Lead Location: 753860
Implantable Lead Model: 3830
Implantable Lead Model: 6935
Implantable Pulse Generator Implant Date: 20230823
Lead Channel Impedance Value: 323 Ohm
Lead Channel Impedance Value: 437 Ohm
Lead Channel Impedance Value: 494 Ohm
Lead Channel Sensing Intrinsic Amplitude: 13 mV
Lead Channel Sensing Intrinsic Amplitude: 14.625 mV
Lead Channel Sensing Intrinsic Amplitude: 4.75 mV
Lead Channel Sensing Intrinsic Amplitude: 4.75 mV
Lead Channel Setting Pacing Amplitude: 1.5 V
Lead Channel Setting Pacing Amplitude: 3.5 V
Lead Channel Setting Pacing Pulse Width: 0.4 ms
Lead Channel Setting Sensing Sensitivity: 0.3 mV

## 2021-09-15 NOTE — Patient Instructions (Addendum)
   After Your ICD (Implantable Cardiac Defibrillator)    Monitor your defibrillator site for redness, swelling, and drainage. Call the device clinic at [phone] if you experience these symptoms or fever/chills.  Your incision was closed with Steri-strips or staples:  You may shower 7 days after your procedure and wash your incision with soap and water. Avoid lotions, ointments, or perfumes over your incision until it is well-healed.  You may use a hot tub or a pool after your wound check appointment if the incision is completely closed.  Do not lift, push or pull greater than 10 pounds with the affected arm until 6 weeks after your procedure. There are no other restrictions in arm movement after your wound check appointment.   Your ICD is designed to protect you from life threatening heart rhythms. Because of this, you may receive a shock.   1 shock with no symptoms:  Call the office during business hours. 1 shock with symptoms (chest pain, chest pressure, dizziness, lightheadedness, shortness of breath, overall feeling unwell):  Call 911. If you experience 2 or more shocks in 24 hours:  Call 911. If you receive a shock, you should not drive.  [place] DMV - no driving for 6 months if you receive appropriate therapy from your ICD.   ICD Alerts:  Some alerts are vibratory and others beep. These are NOT emergencies. Please call our office to let us know. If this occurs at night or on weekends, it can wait until the next business day. Send a remote transmission.  If your device is capable of reading fluid status (for heart failure), you will be offered monthly monitoring to review this with you.   Remote monitoring is used to monitor your ICD from home. This monitoring is scheduled every 91 days by our office. It allows [place] to keep an eye on the functioning of your device to ensure it is working properly. You will routinely see your Electrophysiologist annually (more often if necessary).    Treatment for nasal congestion:  Saline nasal spray or netty pot.  Flonase nasal spray can be helpful.  Afrin is good for congestion but only use for 3 days.  Antihistamines like Claritin, zyrtec or allegra are ok to use.  Only avoid sudafed or sudafed PE products.  Avoid combination products because they often have NSAID's.

## 2021-09-15 NOTE — Telephone Encounter (Signed)
Advanced Heart Failure Patient Advocate Encounter  Sent in [place] application via fax, 00/92. Document scanned to chart.   Will follow up.

## 2021-09-15 NOTE — Progress Notes (Addendum)
[name] ([id number]) , Visit Report for 09/15/2021 Chief Complaint Document Details Patient Name: Date of Service: PASQUALE, MATTERS 09/15/2021 2:15 PM Medical Record Number: [id number] Patient Account Number: [id number] Date of Birth/Sex: Treating RN: 09-15-1955 (66 y.o. M) Primary Care Provider: [name] Other Clinician: Referring Provider: Treating Provider/Extender: [name] in Treatment: 6 Information Obtained from: Patient Chief Complaint 08/04/2021; patient returns to clinic with bilateral leg wounds as well as areas on the right foot Electronic Signature(s) Signed: 09/15/2021 2:34:12 PM By: [name] PA-C Entered By: [name] on 09/15/2021 14:34:12 -------------------------------------------------------------------------------- Debridement Details Patient Name: Date of Service: Gero, [name] 09/15/2021 2:15 PM Medical Record Number: [id number] Patient Account Number: [id number] Date of Birth/Sex: Treating RN: June 14, 1955 (66 y.o. [name], [name] Primary Care Provider: [name] Other Clinician: Referring Provider: Treating Provider/Extender: [name] in Treatment: 6 Debridement Performed for Assessment: Wound #8 Right,Lateral Malleolus Performed By: Physician [name], PA Debridement Type: Debridement Severity of Tissue Pre Debridement: Fat layer exposed Level of Consciousness (Pre-procedure): Awake and Alert Pre-procedure Verification/Time Out Yes - 14:50 Taken: Start Time: 14:51 Pain Control: Lidocaine 5% topical ointment T Area Debrided (L x W): otal 2 (cm) x 2 (cm) = 4 (cm) Tissue and other material debrided: Viable, Non-Viable, Skin: Dermis , Skin: Epidermis, Other: dressing Level: Skin/Epidermis Debridement Description: Selective/Open Wound Instrument: Curette Bleeding: None End Time: 14:55 Procedural Pain: 0 Post Procedural Pain: 0 Response to Treatment: Procedure was  tolerated well Level of Consciousness (Post- Awake and Alert procedure): Post Debridement Measurements of Total Wound Length: (cm) 1 Width: (cm) 1 Depth: (cm) 0.1 Volume: (cm) 0.079 Character of Wound/Ulcer Post Debridement: Improved Severity of Tissue Post Debridement: Fat layer exposed Post Procedure Diagnosis Same as Pre-procedure Electronic Signature(s) Signed: 09/15/2021 5:44:03 PM By: Deon Pilling RN, BSN Signed: 09/15/2021 7:10:08 PM By: [name] PA-C Entered By: Deon Pilling on 09/15/2021 14:55:41 -------------------------------------------------------------------------------- Debridement Details Patient Name: Date of Service: [place]ow, [name] 09/15/2021 2:15 PM Medical Record Number: [id number] Patient Account Number: [id number] Date of Birth/Sex: Treating RN: 04/16/1955 (66 y.o. [name], [name] Primary Care Provider: [name] Other Clinician: Referring Provider: Treating Provider/Extender: [name] in Treatment: 6 Debridement Performed for Assessment: Wound #7 Right,Medial Malleolus Performed By: Physician [name], PA Debridement Type: Debridement Severity of Tissue Pre Debridement: Fat layer exposed Level of Consciousness (Pre-procedure): Awake and Alert Pre-procedure Verification/Time Out Yes - 14:50 Taken: Start Time: 14:51 Pain Control: Lidocaine 5% topical ointment T Area Debrided (L x W): otal 6.3 (cm) x 7 (cm) = 44.1 (cm) Tissue and other material debrided: Viable, Non-Viable, Slough, Subcutaneous, Skin: Dermis , Skin: Epidermis, Slough, Other: dressing Level: Skin/Subcutaneous Tissue Debridement Description: Excisional Instrument: Curette Bleeding: None End Time: 14:55 Procedural Pain: 0 Post Procedural Pain: 0 Response to Treatment: Procedure was tolerated well Level of Consciousness (Post- Awake and Alert procedure): Post Debridement Measurements of Total Wound Length: (cm) 6.3 Width: (cm) 7 Depth:  (cm) 0.9 Volume: (cm) 31.172 Character of Wound/Ulcer Post Debridement: Improved Severity of Tissue Post Debridement: Fat layer exposed Post Procedure Diagnosis Same as Pre-procedure Electronic Signature(s) Signed: 09/15/2021 5:44:03 PM By: Deon Pilling RN, BSN Signed: 09/15/2021 7:10:08 PM By: [name] PA-C Entered By: Deon Pilling on 09/15/2021 14:56:13 -------------------------------------------------------------------------------- HPI Details Patient Name: Date of Service: Bjorn, [name] 09/15/2021 2:15 PM Medical Record Number: [id number] Patient Account Number: [id number] Date of Birth/Sex: Treating  RN: 04/04/1955 (66 y.o. M) Primary Care Provider: [name] Other Clinician: Referring Provider: Treating Provider/Extender: [name] in Treatment: 6 History of Present Illness HPI Description: ADMISSION 03/22/2021 This is a 66 year old man with a past medical history significant for diabetes type 2, congestive heart failure, peripheral arterial disease, morbid obesity, venous insufficiency, and coronary artery disease. He has been followed by [name] in podiatry, who performed a transmetatarsal amputation on the left foot in August 2022. He had issues healing that wound, but based upon Dr. [name] notes, ultimately the TMA wound healed. During his recovery from that surgery, however, ulcers opened up over the DIP joint of the right second and third toe. These have apparently closed and reopened multiple times. It sounds like one of the issues has been moisture accumulation and maceration of the tissues causing them to reopen. At his last visit with [name], on March 01, 2021, there continues to be problems with moisture and he was referred to wound care for further evaluation and management. He had a formal aortogram with runoff performed prior to his TMA. The findings are copied here: Patient has inline flow to both feet with no  significant flow-limiting lesion that would be amenable to percutaneous or open revascularization. He does have an element of small vessel disease and has a short segment occlusion of the distal anterior tibial/dorsalis pedis artery on the left foot but does have posterior tibial artery flow. Would recommend management of wounds with amputation of toes 2 and 3 on the right foot if the wounds do not heal and deteriorate. Transmetatarsal amputation on the left side has as good a blood supply as it is going to get and hopefully this will heal in the future. Formal ABIs were done in January 2023. They are normal bilaterally. ABI Findings: +---------+------------------+-----+----------+--------+ Right Rt Pressure (mmHg)IndexWaveform Comment  +---------+------------------+-----+----------+--------+ Brachial 160     +---------+------------------+-----+----------+--------+ PTA 192 1.06 monophasic  +---------+------------------+-----+----------+--------+ DP 159 0.88 monophasic  +---------+------------------+-----+----------+--------+ Great T oe145 0.80    +---------+------------------+-----+----------+--------+ +--------+------------------+-----+---------+-------+ Left Lt Pressure (mmHg)IndexWaveform Comment +--------+------------------+-----+---------+-------+ [id number]     +--------+------------------+-----+---------+-------+ PTA 204 1.13 triphasic  +--------+------------------+-----+---------+-------+ DP 194 1.07 biphasic   +--------+------------------+-----+---------+-------+ +-------+-----------+-----------+------------+------------+ ABI/TBIT oday's ABIT oday's TBIPrevious ABIPrevious TBI +-------+-----------+-----------+------------+------------+ Right 1.06 0.8 1.26 0.65  +-------+-----------+-----------+------------+------------+ Left 1.13 amputation 1.15 amputation   +-------+-----------+-----------+------------+------------+ Previous ABI on 08/06/20 at [facility] Pedal pressures falsely elevated due to medial calcification. Summary: Right: Resting right ankle-brachial index is within normal range. The right toe-brachial index is normal. Left: Resting left ankle-brachial index is within normal range. READMISSION 08/04/2021 Mr. Tietze is now a 66 year old man who I remember from this clinic many years ago I think he had a right lower extremity predominantly venous wound at the time. He was here for 1 visit in March of this year had wounds on his right second and third toes we apparently dressed them many and they healed so he did not come back. He is listed in [place] is being a diabetic although the patient denies this says he is verified it with his primary doctor. In any case over the last several weeks or so according the patient although these wounds look somewhat more chronic than that he has developed predominantly large wounds on the right medial and right lateral ankle smaller areas on the left leg and areas on the dorsal aspect of his right second and third toes. Its not clear how he has been dressing these. 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 The patient has known PAD. He had an angiogram in August 2022 at which time he had nonhealing wounds in both feet. On the left his major vessels in the thigh were all patent. He had three-vessel patent to the level of the ankle. He had a very short occlusion in the left anterior tibial. On the right lower extremity the major vessels in his thigh were all patent. He had three-vessel runoff to the foot sluggish filling of the anterior tibial artery. He was felt to have some component of small vessel disease but nothing that was amenable or needed revascularization. It was recommended that he have amputation of the second and  third toes on the right foot if they did not heal He has been following with [name] of podiatry. [name] got him juxta lite stockings although I do not think he had them on properly he has uncontrolled edema in both legs Past medical history includes type 2 diabetes [although the patient really denies this], left TMA in 2022,lower extremity wounds in fact attendance at this clinic in 2009-2010, A-fib on Eliquis, chronic venous insufficiency with secondary lymphedema history of non-Hodgkin's lymphoma. 08-11-2021 upon evaluation today patient presents for follow-up evaluation he was seen last Wednesday initially for inspection here in our clinic. With that being said he tells me that he unfortunately has been having a lot of drainage and is actually coming through his wrap. Fortunately I do not see any evidence of active infection locally or systemically at this time which is great news. No fevers, chills, nausea, vomiting, or diarrhea. With that being said there does appear to be some evidence of local infection based on what I am seeing today. 08-18-2021 upon evaluation today patient appears to be doing okay currently in regard to his wounds with that being said that he is doing much better but still has a long ways to go to get to where he wanted to be. I think the infection is significantly improved. He has another week of the antibiotic at this point. 08-25-2021 upon evaluation today patient's wounds are actually doing decently well he has erythema has significantly improved. I think the cellulitis is under controlled I am going to continue him on 2 more weeks of the Levaquin at 500 mg this is a lower dose but I am hoping it will be better for him. 09-08-2021 upon evaluation today patient appears to be doing excellent in regard to his wounds. Since I last saw him he was actually in the hospital where he ended up having a pacemaker put in. Subsequently he tells me that he is actually doing quite  well although they were unsure whether they were going to do it due to the fact that he had the wounds on his legs. And then I am glad they did anything seem to be doing well. 09-15-2021 upon evaluation patient's wounds are actually showing signs of improvement. The right side wounds do appear to have some need for sharp debridement today and I Georgina Peer go ahead and proceed with that. I think that if we get the wounds cleaned up he will actually show signs of continued improvement. I am also leaning towards switching to [facility] which I think will be a much better option for him. Electronic Signature(s) Signed: 09/15/2021 5:46:40 PM By: [name] PA-C Entered By: [name] on 09/15/2021 17:46:40 -------------------------------------------------------------------------------- Physical Exam Details Patient Name: Date  of Service: ELLIS, MEHAFFEY 09/15/2021 2:15 PM Medical Record Number: [id number] Patient Account Number: [id number] Date of Birth/Sex: Treating RN: 14-May-1955 (66 y.o. M) Primary Care Provider: [name] Other Clinician: Referring Provider: Treating Provider/Extender: [name] in Treatment: 6 Constitutional Obese and well-hydrated in no acute distress. Respiratory normal breathing without difficulty. Psychiatric this patient is able to make decisions and demonstrates good insight into disease process. Alert and Oriented x 3. pleasant and cooperative. Notes Upon inspection patient's wound bed showed signs of good granulation overall I did have to perform some debridement on the right lateral ankle as well as the right lower extremity. He tolerated these debridements today without complication postdebridement the wound bed appears to be doing significantly better which is great news. Electronic Signature(s) Signed: 09/15/2021 5:46:58 PM By: [name] PA-C Entered By: [name] on 09/15/2021  17:46:58 -------------------------------------------------------------------------------- Physician Orders Details Patient Name: Date of Service: Sobel, [name] 09/15/2021 2:15 PM Medical Record Number: [id number] Patient Account Number: [id number] Date of Birth/Sex: Treating RN: 10-May-1955 (66 y.o. [name], [name] Primary Care Provider: [name] Other Clinician: Referring Provider: Treating Provider/Extender: [name] in Treatment: 6 Verbal / Phone Orders: No Diagnosis Coding ICD-10 Coding Code Description I89.0 Lymphedema, not elsewhere classified I87.333 Chronic venous hypertension (idiopathic) with ulcer and inflammation of bilateral lower extremity L97.828 Non-pressure chronic ulcer of other part of left lower leg with other specified severity L97.818 Non-pressure chronic ulcer of other part of right lower leg with other specified severity E11.621 Type 2 diabetes mellitus with foot ulcer L97.518 Non-pressure chronic ulcer of other part of right foot with other specified severity Follow-up Appointments ppointment in 1 week. Margarita Grizzle and [place], Room 8 [facility] Return A ppointment in 2 weeks. Margarita Grizzle and [place], Room 8 [facility] Return A Other: - [place] topical compounding antibiotics- bring to your appointments. Anesthetic (In clinic) Topical Lidocaine 5% applied to wound bed Bathing/ Shower/ Hygiene May shower with protection but do not get wound dressing(s) wet. Edema Control - Lymphedema / SCD / Other Elevate legs to the level of the heart or above for 30 minutes daily and/or when sitting, a frequency of: - 3-4 times a day throughout the day. Avoid standing for long periods of time. Exercise regularly Wound Treatment Wound #12 - Lower Leg Wound Laterality: Left, Anterior Peri-Wound Care: Zinc Oxide Ointment 30g tube 1 x Per Week/30 Days Discharge Instructions: Apply Zinc Oxide to periwound with each dressing change Peri-Wound Care:  Sween Lotion (Moisturizing lotion) 1 x Per Week/30 Days Discharge Instructions: Apply moisturizing lotion as directed Topical: Keystone topical compounding antibiotics 1 x Per Week/30 Days Discharge Instructions: apply directly to wound bed. Prim Dressing: Hydrofera Blue Ready Foam, 4x5 in 1 x Per Week/30 Days ary Discharge Instructions: Apply to wound bed as instructed Secondary Dressing: ABD Pad, 8x10 1 x Per Week/30 Days Discharge Instructions: Apply over primary dressing as directed. Secondary Dressing: Optifoam Non-Adhesive Dressing, 4x4 in 1 x Per Week/30 Days Discharge Instructions: Apply TO LEFT ANTERIOR ANKLE TO PROTECT ANKLE. Secondary Dressing: Zetuvit Plus 4x8 in 1 x Per Week/30 Days Discharge Instructions: Apply over primary dressing as directed. Compression Wrap: ThreePress (3 layer compression wrap) 1 x Per Week/30 Days Discharge Instructions: Apply three layer compression as directed. Wound #7 - Malleolus Wound Laterality: Right, Medial Peri-Wound Care: Zinc Oxide Ointment 30g tube 1 x Per Week/30 Days Discharge Instructions: Apply Zinc Oxide to periwound with each dressing change  Peri-Wound Care: Sween Lotion (Moisturizing lotion) 1 x Per Week/30 Days Discharge Instructions: Apply moisturizing lotion as directed Topical: Keystone topical compounding antibiotics 1 x Per Week/30 Days Discharge Instructions: apply directly to wound bed. Prim Dressing: Hydrofera Blue Ready Foam, 4x5 in 1 x Per Week/30 Days ary Discharge Instructions: Apply to wound bed as instructed Secondary Dressing: ABD Pad, 8x10 1 x Per Week/30 Days Discharge Instructions: Apply over primary dressing as directed. Secondary Dressing: Zetuvit Plus 4x8 in 1 x Per Week/30 Days Discharge Instructions: Apply over primary dressing as directed. Compression Wrap: ThreePress (3 layer compression wrap) 1 x Per Week/30 Days Discharge Instructions: Apply three layer compression as directed. Wound #8 - Malleolus  Wound Laterality: Right, Lateral Peri-Wound Care: Zinc Oxide Ointment 30g tube 1 x Per Week/30 Days Discharge Instructions: Apply Zinc Oxide to periwound with each dressing change Peri-Wound Care: Sween Lotion (Moisturizing lotion) 1 x Per Week/30 Days Discharge Instructions: Apply moisturizing lotion as directed Topical: Keystone topical compounding antibiotics 1 x Per Week/30 Days Discharge Instructions: apply directly to wound bed. Prim Dressing: Hydrofera Blue Ready Foam, 4x5 in 1 x Per Week/30 Days ary Discharge Instructions: Apply to wound bed as instructed Secondary Dressing: ABD Pad, 8x10 1 x Per Week/30 Days Discharge Instructions: Apply over primary dressing as directed. Secondary Dressing: Zetuvit Plus 4x8 in 1 x Per Week/30 Days Discharge Instructions: Apply over primary dressing as directed. Compression Wrap: ThreePress (3 layer compression wrap) 1 x Per Week/30 Days Discharge Instructions: Apply three layer compression as directed. Electronic Signature(s) Signed: 09/15/2021 5:44:03 PM By: Deon Pilling RN, BSN Signed: 09/15/2021 7:10:08 PM By: [name] PA-C Entered By: Deon Pilling on 09/15/2021 14:56:21 -------------------------------------------------------------------------------- Problem List Details Patient Name: Date of Service: [place], [name] 09/15/2021 2:15 PM Medical Record Number: [id number] Patient Account Number: [id number] Date of Birth/Sex: Treating RN: September 08, 1955 (66 y.o. M) Primary Care Provider: [name] Other Clinician: Referring Provider: Treating Provider/Extender: [name] in Treatment: 6 Active Problems ICD-10 Encounter Code Description Active Date MDM Diagnosis I89.0 Lymphedema, not elsewhere classified 08/04/2021 No Yes I87.333 Chronic venous hypertension (idiopathic) with ulcer and inflammation of 08/04/2021 No Yes bilateral lower extremity L97.828 Non-pressure chronic ulcer of other part of left lower  leg with other specified 08/04/2021 No Yes severity L97.818 Non-pressure chronic ulcer of other part of right lower leg with other specified 08/04/2021 No Yes severity E11.621 Type 2 diabetes mellitus with foot ulcer 08/04/2021 No Yes L97.518 Non-pressure chronic ulcer of other part of right foot with other specified 08/04/2021 No Yes severity Inactive Problems Resolved Problems Electronic Signature(s) Signed: 09/15/2021 2:34:06 PM By: [name] PA-C Entered By: [name] on 09/15/2021 14:34:05 -------------------------------------------------------------------------------- Progress Note Details Patient Name: Date of Service: [place], [name] 09/15/2021 2:15 PM Medical Record Number: [id number] Patient Account Number: [id number] Date of Birth/Sex: Treating RN: 1955-08-11 (66 y.o. M) Primary Care Provider: [name] Other Clinician: Referring Provider: Treating Provider/Extender: [name] in Treatment: 6 Subjective Chief Complaint Information obtained from Patient 08/04/2021; patient returns to clinic with bilateral leg wounds as well as areas on the right foot History of Present Illness (HPI) ADMISSION 03/22/2021 This is a 66 year old man with a past medical history significant for diabetes type 2, congestive heart failure, peripheral arterial disease, morbid obesity, venous insufficiency, and coronary artery disease. He has been followed by [name] in podiatry, who performed a transmetatarsal amputation on the left foot in August 2022. He had issues healing  that wound, but based upon Dr. [name] notes, ultimately the TMA wound healed. During his recovery from that surgery, however, ulcers opened up over the DIP joint of the right second and third toe. These have apparently closed and reopened multiple times. It sounds like one of the issues has been moisture accumulation and maceration of the tissues causing them to reopen. At his last visit with  [name], on March 01, 2021, there continues to be problems with moisture and he was referred to wound care for further evaluation and management. He had a formal aortogram with runoff performed prior to his TMA. The findings are copied here: Patient has inline flow to both feet with no significant flow-limiting lesion that would be amenable to percutaneous or open revascularization. He does have an element of small vessel disease and has a short segment occlusion of the distal anterior tibial/dorsalis pedis artery on the left foot but does have posterior tibial artery flow. Would recommend management of wounds with amputation of toes 2 and 3 on the right foot if the wounds do not heal and deteriorate. Transmetatarsal amputation on the left side has as good a blood supply as it is going to get and hopefully this will heal in the future. Formal ABIs were done in January 2023. They are normal bilaterally. ABI Findings: +---------+------------------+-----+----------+--------+ Right Rt Pressure (mmHg)IndexWaveform Comment  +---------+------------------+-----+----------+--------+ Brachial 160    +---------+------------------+-----+----------+--------+ PTA 192 1.06 monophasic  +---------+------------------+-----+----------+--------+ DP 159 0.88 monophasic  +---------+------------------+-----+----------+--------+ Great T oe145 0.80    +---------+------------------+-----+----------+--------+ +--------+------------------+-----+---------+-------+ Left Lt Pressure (mmHg)IndexWaveform Comment +--------+------------------+-----+---------+-------+ [id number]    +--------+------------------+-----+---------+-------+ PTA 204 1.13 triphasic  +--------+------------------+-----+---------+-------+ DP 194 1.07 biphasic   +--------+------------------+-----+---------+-------+ +-------+-----------+-----------+------------+------------+ ABI/TBIT  oday's ABIT oday's TBIPrevious ABIPrevious TBI +-------+-----------+-----------+------------+------------+ Right 1.06 0.8 1.26 0.65  +-------+-----------+-----------+------------+------------+ Left 1.13 amputation 1.15 amputation  +-------+-----------+-----------+------------+------------+ Previous ABI on 08/06/20 at [facility] Pedal pressures falsely elevated due to medial calcification. Summary: Right: Resting right ankle-brachial index is within normal range. The right toe-brachial index is normal. Left: Resting left ankle-brachial index is within normal range. READMISSION 08/04/2021 Mr. Hunley is now a 66 year old man who I remember from this clinic many years ago I think he had a right lower extremity predominantly venous wound at the time. He was here for 1 visit in March of this year had wounds on his right second and third toes we apparently dressed them many and they healed so he did not come back. He is listed in [place] is being a diabetic although the patient denies this says he is verified it with his primary doctor. In any case over the last several weeks or so according the patient although these wounds look somewhat more chronic than that he has developed predominantly large wounds on the right medial and right lateral ankle smaller areas on the left leg and areas on the dorsal aspect of his right second and third toes. Its not clear how he has been dressing these. 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 The patient has known PAD. He had an angiogram in August 2022 at which time he had nonhealing wounds in both feet. On the left his major vessels in the thigh were all patent. He had three-vessel patent to the level of the ankle. He had a very short occlusion in the left anterior tibial. On the right lower extremity the major vessels in his thigh were all patent. He had three-vessel  runoff to  the foot sluggish filling of the anterior tibial artery. He was felt to have some component of small vessel disease but nothing that was amenable or needed revascularization. It was recommended that he have amputation of the second and third toes on the right foot if they did not heal He has been following with [name] of podiatry. [name] got him juxta lite stockings although I do not think he had them on properly he has uncontrolled edema in both legs Past medical history includes type 2 diabetes [although the patient really denies this], left TMA in 2022,lower extremity wounds in fact attendance at this clinic in 2009-2010, A-fib on Eliquis, chronic venous insufficiency with secondary lymphedema history of non-Hodgkin's lymphoma. 08-11-2021 upon evaluation today patient presents for follow-up evaluation he was seen last Wednesday initially for inspection here in our clinic. With that being said he tells me that he unfortunately has been having a lot of drainage and is actually coming through his wrap. Fortunately I do not see any evidence of active infection locally or systemically at this time which is great news. No fevers, chills, nausea, vomiting, or diarrhea. With that being said there does appear to be some evidence of local infection based on what I am seeing today. 08-18-2021 upon evaluation today patient appears to be doing okay currently in regard to his wounds with that being said that he is doing much better but still has a long ways to go to get to where he wanted to be. I think the infection is significantly improved. He has another week of the antibiotic at this point. 08-25-2021 upon evaluation today patient's wounds are actually doing decently well he has erythema has significantly improved. I think the cellulitis is under controlled I am going to continue him on 2 more weeks of the Levaquin at 500 mg this is a lower dose but I am hoping it will be better for  him. 09-08-2021 upon evaluation today patient appears to be doing excellent in regard to his wounds. Since I last saw him he was actually in the hospital where he ended up having a pacemaker put in. Subsequently he tells me that he is actually doing quite well although they were unsure whether they were going to do it due to the fact that he had the wounds on his legs. And then I am glad they did anything seem to be doing well. 09-15-2021 upon evaluation patient's wounds are actually showing signs of improvement. The right side wounds do appear to have some need for sharp debridement today and I Georgina Peer go ahead and proceed with that. I think that if we get the wounds cleaned up he will actually show signs of continued improvement. I am also leaning towards switching to [facility] which I think will be a much better option for him. Objective Constitutional Obese and well-hydrated in no acute distress. Vitals Time Taken: 2:15 PM, Height: 71 in, Weight: 350 lbs, BMI: 48.8, Temperature: 97.7 F, Pulse: 79 bpm, Respiratory Rate: 18 breaths/min, Blood Pressure: 119/75 mmHg. Respiratory normal breathing without difficulty. Psychiatric this patient is able to make decisions and demonstrates good insight into disease process. Alert and Oriented x 3. pleasant and cooperative. General Notes: Upon inspection patient's wound bed showed signs of good granulation overall I did have to perform some debridement on the right lateral ankle as well as the right lower extremity. He tolerated these debridements today without complication postdebridement the wound bed appears to be doing significantly better which is  great news. Integumentary (Hair, Skin) Wound #12 status is Open. Original cause of wound was Gradually Appeared. The date acquired was: 07/15/2021. The wound has been in treatment 6 weeks. The wound is located on the Left,Anterior Lower Leg. The wound measures 1.2cm length x 1.7cm width x 0.1cm depth;  1.602cm^2 area and 0.16cm^3 volume. There is Fat Layer (Subcutaneous Tissue) exposed. There is no tunneling or undermining noted. There is a large amount of serosanguineous drainage noted. The wound margin is distinct with the outline attached to the wound base. There is large (67-100%) red, pink granulation within the wound bed. There is no necrotic tissue within the wound bed. Wound #7 status is Open. Original cause of wound was Blister. The date acquired was: 07/21/2021. The wound has been in treatment 6 weeks. The wound is located on the Right,Medial Malleolus. The wound measures 6.3cm length x 7cm width x 0.9cm depth; 34.636cm^2 area and 31.172cm^3 volume. There is Fat Layer (Subcutaneous Tissue) exposed. There is no tunneling or undermining noted. There is a medium amount of serosanguineous drainage noted. The wound margin is distinct with the outline attached to the wound base. There is medium (34-66%) red granulation within the wound bed. There is a medium (34-66%) amount of necrotic tissue within the wound bed including Adherent Slough. Wound #8 status is Open. Original cause of wound was Gradually Appeared. The date acquired was: 07/22/2021. The wound has been in treatment 6 weeks. The wound is located on the Right,Lateral Malleolus. The wound measures 2cm length x 2cm width x 0.1cm depth; 3.142cm^2 area and 0.314cm^3 volume. There is Fat Layer (Subcutaneous Tissue) exposed. There is no tunneling or undermining noted. There is a medium amount of serosanguineous drainage noted. The wound margin is distinct with the outline attached to the wound base. There is small (1-33%) red, pink granulation within the wound bed. There is a large (67- 100%) amount of necrotic tissue within the wound bed including Adherent Slough. Assessment Active Problems ICD-10 Lymphedema, not elsewhere classified Chronic venous hypertension (idiopathic) with ulcer and inflammation of bilateral lower  extremity Non-pressure chronic ulcer of other part of left lower leg with other specified severity Non-pressure chronic ulcer of other part of right lower leg with other specified severity Type 2 diabetes mellitus with foot ulcer Non-pressure chronic ulcer of other part of right foot with other specified severity Procedures Wound #7 Pre-procedure diagnosis of Wound #7 is a Venous Leg Ulcer located on the Right,Medial Malleolus .Severity of Tissue Pre Debridement is: Fat layer exposed. There was a Excisional Skin/Subcutaneous Tissue Debridement with a total area of 44.1 sq cm performed by [name], PA. With the following instrument(s): Curette to remove Viable and Non-Viable tissue/material. Material removed includes Subcutaneous Tissue, Slough, Skin: Dermis, Skin: Epidermis, and Other: dressing after achieving pain control using Lidocaine 5% topical ointment. A time out was conducted at 14:50, prior to the start of the procedure. There was no bleeding. The procedure was tolerated well with a pain level of 0 throughout and a pain level of 0 following the procedure. Post Debridement Measurements: 6.3cm length x 7cm width x 0.9cm depth; 31.172cm^3 volume. Character of Wound/Ulcer Post Debridement is improved. Severity of Tissue Post Debridement is: Fat layer exposed. Post procedure Diagnosis Wound #7: Same as Pre-Procedure Pre-procedure diagnosis of Wound #7 is a Venous Leg Ulcer located on the Right,Medial Malleolus . There was a Three Layer Compression Therapy Procedure by Deon Pilling, RN. Post procedure Diagnosis Wound #7: Same as Pre-Procedure Wound #8 Pre-procedure  diagnosis of Wound #8 is a Venous Leg Ulcer located on the Right,Lateral Malleolus .Severity of Tissue Pre Debridement is: Fat layer exposed. There was a Selective/Open Wound Skin/Epidermis Debridement with a total area of 4 sq cm performed by [name], PA. With the following instrument(s): Curette to remove Viable  and Non-Viable tissue/material. Material removed includes Skin: Dermis, Skin: Epidermis, and Other: dressing after achieving pain control using Lidocaine 5% topical ointment. A time out was conducted at 14:50, prior to the start of the procedure. There was no bleeding. The procedure was tolerated well with a pain level of 0 throughout and a pain level of 0 following the procedure. Post Debridement Measurements: 1cm length x 1cm width x 0.1cm depth; 0.079cm^3 volume. Character of Wound/Ulcer Post Debridement is improved. Severity of Tissue Post Debridement is: Fat layer exposed. Post procedure Diagnosis Wound #8: Same as Pre-Procedure Pre-procedure diagnosis of Wound #8 is a Venous Leg Ulcer located on the Right,Lateral Malleolus . There was a Three Layer Compression Therapy Procedure by Deon Pilling, RN. Post procedure Diagnosis Wound #8: Same as Pre-Procedure Wound #12 Pre-procedure diagnosis of Wound #12 is a Venous Leg Ulcer located on the Left,Anterior Lower Leg . There was a Three Layer Compression Therapy Procedure by Deon Pilling, RN. Post procedure Diagnosis Wound #12: Same as Pre-Procedure Plan Follow-up Appointments: Return Appointment in 1 week. Margarita Grizzle and [place], Room 8 [facility] Return Appointment in 2 weeks. Margarita Grizzle and [place], Room 8 [facility] Other: - [facility] topical compounding antibiotics- bring to your appointments. Anesthetic: (In clinic) Topical Lidocaine 5% applied to wound bed Bathing/ Shower/ Hygiene: May shower with protection but do not get wound dressing(s) wet. Edema Control - Lymphedema / SCD / Other: Elevate legs to the level of the heart or above for 30 minutes daily and/or when sitting, a frequency of: - 3-4 times a day throughout the day. Avoid standing for long periods of time. Exercise regularly WOUND #12: - Lower Leg Wound Laterality: Left, Anterior Peri-Wound Care: Zinc Oxide Ointment 30g tube 1 x Per Week/30 Days Discharge Instructions: Apply  Zinc Oxide to periwound with each dressing change Peri-Wound Care: Sween Lotion (Moisturizing lotion) 1 x Per Week/30 Days Discharge Instructions: Apply moisturizing lotion as directed Topical: Keystone topical compounding antibiotics 1 x Per Week/30 Days Discharge Instructions: apply directly to wound bed. Prim Dressing: Hydrofera Blue Ready Foam, 4x5 in 1 x Per Week/30 Days ary Discharge Instructions: Apply to wound bed as instructed Secondary Dressing: ABD Pad, 8x10 1 x Per Week/30 Days Discharge Instructions: Apply over primary dressing as directed. Secondary Dressing: Optifoam Non-Adhesive Dressing, 4x4 in 1 x Per Week/30 Days Discharge Instructions: Apply TO LEFT ANTERIOR ANKLE TO PROTECT ANKLE. Secondary Dressing: Zetuvit Plus 4x8 in 1 x Per Week/30 Days Discharge Instructions: Apply over primary dressing as directed. Com pression Wrap: ThreePress (3 layer compression wrap) 1 x Per Week/30 Days Discharge Instructions: Apply three layer compression as directed. WOUND #7: - Malleolus Wound Laterality: Right, Medial Peri-Wound Care: Zinc Oxide Ointment 30g tube 1 x Per Week/30 Days Discharge Instructions: Apply Zinc Oxide to periwound with each dressing change Peri-Wound Care: Sween Lotion (Moisturizing lotion) 1 x Per Week/30 Days Discharge Instructions: Apply moisturizing lotion as directed Topical: Keystone topical compounding antibiotics 1 x Per Week/30 Days Discharge Instructions: apply directly to wound bed. Prim Dressing: Hydrofera Blue Ready Foam, 4x5 in 1 x Per Week/30 Days ary Discharge Instructions: Apply to wound bed as instructed Secondary Dressing: ABD Pad, 8x10 1 x Per Week/30  Days Discharge Instructions: Apply over primary dressing as directed. Secondary Dressing: Zetuvit Plus 4x8 in 1 x Per Week/30 Days Discharge Instructions: Apply over primary dressing as directed. Com pression Wrap: ThreePress (3 layer compression wrap) 1 x Per Week/30 Days Discharge  Instructions: Apply three layer compression as directed. WOUND #8: - Malleolus Wound Laterality: Right, Lateral Peri-Wound Care: Zinc Oxide Ointment 30g tube 1 x Per Week/30 Days Discharge Instructions: Apply Zinc Oxide to periwound with each dressing change Peri-Wound Care: Sween Lotion (Moisturizing lotion) 1 x Per Week/30 Days Discharge Instructions: Apply moisturizing lotion as directed Topical: Keystone topical compounding antibiotics 1 x Per Week/30 Days Discharge Instructions: apply directly to wound bed. Prim Dressing: Hydrofera Blue Ready Foam, 4x5 in 1 x Per Week/30 Days ary Discharge Instructions: Apply to wound bed as instructed Secondary Dressing: ABD Pad, 8x10 1 x Per Week/30 Days Discharge Instructions: Apply over primary dressing as directed. Secondary Dressing: Zetuvit Plus 4x8 in 1 x Per Week/30 Days Discharge Instructions: Apply over primary dressing as directed. Com pression Wrap: ThreePress (3 layer compression wrap) 1 x Per Week/30 Days Discharge Instructions: Apply three layer compression as directed. 1. I am going to recommend that we go ahead and continue with the recommendation for wound care measures as before and the patient is in agreement with plan this includes the use of the [facility] which I think should do quite well with use of the [facility] topical antibiotics as well. 2. I am also can recommend that we have the patient continue with ABD pad to cover followed by the Zetuvit. 3. I am also can recommend continuation of the 3 layer compression wraps this seems to be doing quite well for him as well. We will see patient back for reevaluation in 1 week here in the clinic. If anything worsens or changes patient will contact our office for additional recommendations. Electronic Signature(s) Signed: 09/15/2021 5:47:38 PM By: [name] PA-C Entered By: [name] on 09/15/2021  17:47:38 -------------------------------------------------------------------------------- SuperBill Details Patient Name: Date of Service: Mackel, [name] 09/15/2021 Medical Record Number: [id number] Patient Account Number: [id number] Date of Birth/Sex: Treating RN: 09-15-1955 (66 y.o. [name], [name] Primary Care Provider: [name] Other Clinician: Referring Provider: Treating Provider/Extender: [name] in Treatment: 6 Diagnosis Coding ICD-10 Codes Code Description I89.0 Lymphedema, not elsewhere classified I87.333 Chronic venous hypertension (idiopathic) with ulcer and inflammation of bilateral lower extremity L97.828 Non-pressure chronic ulcer of other part of left lower leg with other specified severity L97.818 Non-pressure chronic ulcer of other part of right lower leg with other specified severity E11.621 Type 2 diabetes mellitus with foot ulcer L97.518 Non-pressure chronic ulcer of other part of right foot with other specified severity Facility Procedures CPT4 Code: [id number] Description: 11042 - DEB SUBQ TISSUE 20 SQ CM/< ICD-10 Diagnosis Description L97.818 Non-pressure chronic ulcer of other part of right lower leg with other specified s Modifier: everity Quantity: 1 CPT4 Code: [id number] Description: 11045 - DEB SUBQ TISS EA ADDL 20CM ICD-10 Diagnosis Description L97.818 Non-pressure chronic ulcer of other part of right lower leg with other specified s Modifier: everity Quantity: 2 CPT4 Code: [id number] Description: 01779 - DEBRIDE WOUND 1ST 20 SQ CM OR < ICD-10 Diagnosis Description L97.518 Non-pressure chronic ulcer of other part of right foot with other specified severi Modifier: ty Quantity: 1 Physician Procedures : CPT4 Code Description Modifier [id number] 11042 - WC PHYS SUBQ TISS 20 SQ CM ICD-10 Diagnosis Description L97.818 Non-pressure chronic ulcer of other  part of right lower leg with other specified severity Quantity: 1 : [id number]  11045 - WC PHYS SUBQ TISS EA ADDL 20 CM ICD-10 Diagnosis Description L97.818 Non-pressure chronic ulcer of other part of right lower leg with other specified severity Quantity: 2 : [id number] 58850 - WC PHYS DEBR WO ANESTH 20 SQ CM ICD-10 Diagnosis Description L97.518 Non-pressure chronic ulcer of other part of right foot with other specified severity Quantity: 1 Electronic Signature(s) Signed: 09/15/2021 5:48:26 PM By: [name] PA-C Previous Signature: 09/15/2021 5:44:03 PM Version By: Deon Pilling RN, BSN Entered By: [name] on 09/15/2021 17:48:26

## 2021-09-15 NOTE — Progress Notes (Signed)
Left bundle lead is in atrial port in dual chamber ICD.  Wound check appointment. Steri-strips removed. Wound without redness or edema. Incision edges approximated, wound well healed. Normal device function. Thresholds, sensing, and impedances consistent with implant measurements. Device programmed at 3.5V for extra safety margin until 3 month visit. Histogram distribution appropriate for patient and level of activity. No mode switches or ventricular arrhythmias noted. Patient educated about wound care, arm mobility, lifting restrictions, shock plan. ROV in 3 months with implanting physician.

## 2021-09-15 NOTE — Progress Notes (Signed)
[name] ([id number]) , Visit Report for 09/15/2021 Arrival Information Details Patient Name: Date of Service: [name], [name] 09/15/2021 2:15 PM Medical Record Number: [id number] Patient Account Number: [id number] Date of Birth/Sex: Treating RN: 09/03/1955 (66 y.o. M) Primary Care Savera Donson: [name] Other Clinician: Referring Marguriete Wootan: Treating Sharada Albornoz/Extender: [name] in Treatment: 6 Visit Information History Since Last Visit Added or deleted any medications: No Patient Arrived: [name] Any new allergies or adverse reactions: No Arrival Time: 14:13 Had a fall or experienced change in No Accompanied By: friend activities of daily living that may affect Transfer Assistance: None risk of falls: Patient Identification Verified: Yes Signs or symptoms of abuse/neglect since last visito No Secondary Verification Process Completed: Yes Hospitalized since last visit: No Patient Requires Transmission-Based Precautions: No Implantable device outside of the clinic excluding No Patient Has Alerts: Yes cellular tissue based products placed in the center Patient Alerts: Patient on Blood Thinner since last visit: 01/2021 ABI L 1.13 R 1.06 Has Compression in Place as Prescribed: Yes 01/2021 TBI L amp R 0.8 Pain Present Now: No Electronic Signature(s) Signed: 09/15/2021 4:42:40 PM By: Erenest Blank Entered By: Erenest Blank on 09/15/2021 14:14:09 -------------------------------------------------------------------------------- Compression Therapy Details Patient Name: Date of Service: [name], [name] Huger D. 09/15/2021 2:15 PM Medical Record Number: [id number] Patient Account Number: [id number] Date of Birth/Sex: Treating RN: 1955-06-09 (66 y.o. [name] Primary Care Annalicia Renfrew: [name] Other Clinician: Referring Eliabeth Shoff: Treating Socrates Cahoon/Extender: [name] in Treatment: 6 Compression Therapy Performed for Wound Assessment:  Wound #12 Left,Anterior Lower Leg Performed By: Clinician Deon Pilling, RN Compression Type: Three Layer Post Procedure Diagnosis Same as Pre-procedure Electronic Signature(s) Signed: 09/15/2021 5:44:03 PM By: Deon Pilling RN, BSN Entered By: Deon Pilling on 09/15/2021 14:51:15 -------------------------------------------------------------------------------- Compression Therapy Details Patient Name: Date of Service: [name], [name] D. 09/15/2021 2:15 PM Medical Record Number: [id number] Patient Account Number: [id number] Date of Birth/Sex: Treating RN: [birth year] (66 y.o. [name] Primary Care Parul Porcelli: [name] Other Clinician: Referring Nthony Lefferts: Treating Adriell Polansky/Extender: [name] in Treatment: 6 Compression Therapy Performed for Wound Assessment: Wound #7 Right,Medial Malleolus Performed By: Clinician Deon Pilling, RN Compression Type: Three Layer Post Procedure Diagnosis Same as Pre-procedure Electronic Signature(s) Signed: 09/15/2021 5:44:03 PM By: Deon Pilling RN, BSN Entered By: Deon Pilling on 09/15/2021 14:51:15 -------------------------------------------------------------------------------- Compression Therapy Details Patient Name: Date of Service: [name], [name] D. 09/15/2021 2:15 PM Medical Record Number: [id number] Patient Account Number: [id number] Date of Birth/Sex: Treating RN: 1955-11-25 (66 y.o. [name] Primary Care Alazia Crocket: [name] Other Clinician: Referring Yemaya Barnier: Treating Kierah Goatley/Extender: [name] in Treatment: 6 Compression Therapy Performed for Wound Assessment: Wound #8 Right,Lateral Malleolus Performed By: Clinician Deon Pilling, RN Compression Type: Three Layer Post Procedure Diagnosis Same as Pre-procedure Electronic Signature(s) Signed: 09/15/2021 5:44:03 PM By: Deon Pilling RN, BSN Entered By: Deon Pilling on 09/15/2021  14:51:15 -------------------------------------------------------------------------------- Encounter Discharge Information Details Patient Name: Date of Service: [name], [name] D. 09/15/2021 2:15 PM Medical Record Number: [id number] Patient Account Number: [id number] Date of Birth/Sex: Treating RN: [birth year] (66 y.o. [name] Primary Care Roy Tokarz: [name] Other Clinician: Referring Desteni Piscopo: Treating Jessicia Napolitano/Extender: [name] in Treatment: 6 Encounter Discharge Information Items Post Procedure Vitals Discharge Condition: Stable Temperature (F): 97.7 Ambulatory Status: Walker Pulse (bpm): 79 Discharge Destination: Home Respiratory Rate (breaths/min): 20 Transportation: Private Auto Blood Pressure (mmHg): 119/75 Accompanied By: friend Schedule Follow-up  Appointment: Yes Clinical Summary of Care: Electronic Signature(s) Signed: 09/15/2021 5:44:03 PM By: Deon Pilling RN, BSN Entered By: Deon Pilling on 09/15/2021 14:59:14 -------------------------------------------------------------------------------- Lower Extremity Assessment Details Patient Name: Date of Service: [name], [name] D. 09/15/2021 2:15 PM Medical Record Number: [id number] Patient Account Number: [id number] Date of Birth/Sex: Treating RN: 03-Jul-1955 (66 y.o. M) Primary Care Laken Lobato: [name] Other Clinician: Referring Morgen Ritacco: Treating Meshawn Oconnor/Extender: [name] in Treatment: 6 Edema Assessment Assessed: [name]: Yes] [name]: Yes] Edema: [Left: Yes] [Right: Yes] Calf Left: Right: Point of Measurement: 31 cm From Medial Instep 41 cm 40 cm Ankle Left: Right: Point of Measurement: 11 cm From Medial Instep 24.6 cm 29 cm Vascular Assessment Pulses: Dorsalis Pedis Palpable: [Left:Yes] [Right:Yes] Electronic Signature(s) Signed: 09/15/2021 4:42:40 PM By: Erenest Blank Entered By: Erenest Blank on 09/15/2021  14:44:22 -------------------------------------------------------------------------------- Multi-Disciplinary Care Plan Details Patient Name: Date of Service: [name], [name] [name] 09/15/2021 2:15 PM Medical Record Number: [id number] Patient Account Number: [id number] Date of Birth/Sex: Treating RN: 08/16/1955 (66 y.o. [name] Primary Care Kolbe Delmonaco: [name] Other Clinician: Referring Hiilei Gerst: Treating Shizue Kaseman/Extender: [name] in Treatment: 6 Multidisciplinary Care Plan reviewed with physician Active Inactive Pain, Acute or Chronic Nursing Diagnoses: Pain, acute or chronic: actual or potential Potential alteration in comfort, pain Goals: Patient will verbalize adequate pain control and receive pain control interventions during procedures as needed Date Initiated: 08/04/2021 Target Resolution Date: 10/08/2021 Goal Status: Active Patient/caregiver will verbalize comfort level met Date Initiated: 08/04/2021 Target Resolution Date: 10/08/2021 Goal Status: Active Interventions: Encourage patient to take pain medications as prescribed Provide education on pain management Reposition patient for comfort Treatment Activities: Administer pain control measures as ordered : 08/04/2021 Notes: Venous Leg Ulcer Nursing Diagnoses: Knowledge deficit related to disease process and management Goals: Non-invasive venous studies are completed as ordered Date Initiated: 08/04/2021 Date Inactivated: 08/25/2021 Target Resolution Date: 08/13/2021 Goal Status: Met Patient/caregiver will verbalize understanding of disease process and disease management Date Initiated: 08/04/2021 Target Resolution Date: 10/08/2021 Goal Status: Active Interventions: Assess peripheral edema status every visit. Compression as ordered Treatment Activities: Non-invasive vascular studies : 08/04/2021 Notes: Wound/Skin Impairment Nursing Diagnoses: Knowledge deficit related to  ulceration/compromised skin integrity Goals: Patient/caregiver will verbalize understanding of skin care regimen Date Initiated: 08/04/2021 Target Resolution Date: 10/08/2021 Goal Status: Active Interventions: Assess patient/caregiver ability to perform ulcer/skin care regimen upon admission and as needed Assess ulceration(s) every visit Provide education on ulcer and skin care Treatment Activities: Skin care regimen initiated : 08/04/2021 Topical wound management initiated : 08/04/2021 Notes: Electronic Signature(s) Signed: 09/15/2021 5:44:03 PM By: Deon Pilling RN, BSN Entered By: Deon Pilling on 09/15/2021 14:51:27 -------------------------------------------------------------------------------- Pain Assessment Details Patient Name: Date of Service: [name], [name] [name] 09/15/2021 2:15 PM Medical Record Number: [id number] Patient Account Number: [id number] Date of Birth/Sex: Treating RN: Mar 08, 1955 (66 y.o. M) Primary Care Karie Skowron: Other Clinician: [name] Referring Kush Farabee: Treating Caliann Leckrone/Extender: [name] in Treatment: 6 Active Problems Location of Pain Severity and Description of Pain Patient Has Paino No Site Locations Pain Management and Medication Current Pain Management: Electronic Signature(s) Signed: 09/15/2021 4:42:40 PM By: Erenest Blank Entered By: Erenest Blank on 09/15/2021 14:16:09 -------------------------------------------------------------------------------- Patient/Caregiver Education Details Patient Name: Date of Service: [name] 9/6/2023andnbsp2:15 PM Medical Record Number: [id number] Patient Account Number: [id number] Date of Birth/Gender: Treating RN: 09/22/1955 (66 y.o. [name] Primary Care Physician: [name] Other Clinician: Referring Physician: Treating Physician/Extender: Arletha Grippe,  [place] Dom in Treatment: 6 Education Assessment Education Provided  To: Patient Education Topics Provided Venous: Handouts: Controlling Swelling with Multilayered Compression Wraps Methods: Explain/Verbal Responses: Reinforcements needed Electronic Signature(s) Signed: 09/15/2021 5:44:03 PM By: Deon Pilling RN, BSN Entered By: Deon Pilling on 09/15/2021 14:51:50 -------------------------------------------------------------------------------- Wound Assessment Details Patient Name: Date of Service: [name], [name] D. 09/15/2021 2:15 PM Medical Record Number: [id number] Patient Account Number: [id number] Date of Birth/Sex: Treating RN: Jan 05, 1956 (66 y.o. M) Primary Care Denai Caba: [name] Other Clinician: Referring Betrice Wanat: Treating Jennafer Gladue/Extender: [name] in Treatment: 6 Wound Status Wound Number: 12 Primary Venous Leg Ulcer Etiology: Wound Location: Left, Anterior Lower Leg Secondary Lymphedema Wounding Event: Gradually Appeared Etiology: Date Acquired: 07/15/2021 Wound Open Weeks Of Treatment: 6 Status: Clustered Wound: No Comorbid Sleep Apnea, Arrhythmia, Congestive Heart Failure, History: Hypertension, Hypotension, Peripheral Arterial Disease, Peripheral Venous Disease, Type II Diabetes, Gout, Osteoarthritis Photos Wound Measurements Length: (cm) 1.2 Width: (cm) 1.7 Depth: (cm) 0.1 Area: (cm) 1.602 Volume: (cm) 0.16 % Reduction in Area: 88.3% % Reduction in Volume: 88.3% Epithelialization: Medium (34-66%) Tunneling: No Undermining: No Wound Description Classification: Full Thickness Without Exposed Support Structures Wound Margin: Distinct, outline attached Exudate Amount: Large Exudate Type: Serosanguineous Exudate Color: red, brown Foul Odor After Cleansing: No Slough/Fibrino No Wound Bed Granulation Amount: Large (67-100%) Exposed Structure Granulation Quality: Red, Pink Fascia Exposed: No Necrotic Amount: None Present (0%) Fat Layer (Subcutaneous Tissue) Exposed: Yes Tendon  Exposed: No Muscle Exposed: No Joint Exposed: No Bone Exposed: No Treatment Notes Wound #12 (Lower Leg) Wound Laterality: Left, Anterior Cleanser Peri-Wound Care Zinc Oxide Ointment 30g tube Discharge Instruction: Apply Zinc Oxide to periwound with each dressing change Sween Lotion (Moisturizing lotion) Discharge Instruction: Apply moisturizing lotion as directed Topical Keystone topical compounding antibiotics Discharge Instruction: apply directly to wound bed. Primary Dressing Hydrofera Blue Ready Foam, 4x5 in Discharge Instruction: Apply to wound bed as instructed Secondary Dressing ABD Pad, 8x10 Discharge Instruction: Apply over primary dressing as directed. Optifoam Non-Adhesive Dressing, 4x4 in Discharge Instruction: Apply TO LEFT ANTERIOR ANKLE TO PROTECT ANKLE. Zetuvit Plus 4x8 in Discharge Instruction: Apply over primary dressing as directed. Secured With Compression Wrap ThreePress (3 layer compression wrap) Discharge Instruction: Apply three layer compression as directed. Compression Stockings Add-Ons Electronic Signature(s) Signed: 09/15/2021 4:42:40 PM By: Erenest Blank Entered By: Erenest Blank on 09/15/2021 14:44:53 -------------------------------------------------------------------------------- Wound Assessment Details Patient Name: Date of Service: Pavlak, [name] D. 09/15/2021 2:15 PM Medical Record Number: [id number] Patient Account Number: [id number] Date of Birth/Sex: Treating RN: November 14, 1955 (66 y.o. M) Primary Care Elam Ellis: [name] Other Clinician: Referring Blondina Coderre: Treating Ashiya Kinkead/Extender: [name] in Treatment: 6 Wound Status Wound Number: 7 Primary Venous Leg Ulcer Etiology: Wound Location: Right, Medial Malleolus Secondary Lymphedema Wounding Event: Blister Etiology: Date Acquired: 07/21/2021 Wound Open Weeks Of Treatment: 6 Status: Clustered Wound: No Comorbid Sleep Apnea, Arrhythmia, Congestive  Heart Failure, History: Hypertension, Hypotension, Peripheral Arterial Disease, Peripheral Venous Disease, Type II Diabetes, Gout, Osteoarthritis Photos Wound Measurements Length: (cm) 6.3 Width: (cm) 7 Depth: (cm) 0.9 Area: (cm) 34.636 Volume: (cm) 31.172 % Reduction in Area: 9.5% % Reduction in Volume: -16.3% Epithelialization: Small (1-33%) Tunneling: No Undermining: No Wound Description Classification: Full Thickness With Exposed Support Structures Wound Margin: Distinct, outline attached Exudate Amount: Medium Exudate Type: Serosanguineous Exudate Color: red, brown Foul Odor After Cleansing: No Slough/Fibrino Yes Wound Bed Granulation Amount: Medium (34-66%) Exposed Structure Granulation Quality: Red Fascia Exposed: No Necrotic Amount: Medium (34-66%) Fat Layer (  Subcutaneous Tissue) Exposed: Yes Necrotic Quality: Adherent Slough Tendon Exposed: No Muscle Exposed: No Joint Exposed: No Bone Exposed: No Treatment Notes Wound #7 (Malleolus) Wound Laterality: Right, Medial Cleanser Peri-Wound Care Zinc Oxide Ointment 30g tube Discharge Instruction: Apply Zinc Oxide to periwound with each dressing change Sween Lotion (Moisturizing lotion) Discharge Instruction: Apply moisturizing lotion as directed Topical Keystone topical compounding antibiotics Discharge Instruction: apply directly to wound bed. Primary Dressing Hydrofera Blue Ready Foam, 4x5 in Discharge Instruction: Apply to wound bed as instructed Secondary Dressing ABD Pad, 8x10 Discharge Instruction: Apply over primary dressing as directed. Zetuvit Plus 4x8 in Discharge Instruction: Apply over primary dressing as directed. Secured With Compression Wrap ThreePress (3 layer compression wrap) Discharge Instruction: Apply three layer compression as directed. Compression Stockings Add-Ons Electronic Signature(s) Signed: 09/15/2021 4:42:40 PM By: Erenest Blank Entered By: Erenest Blank on 09/15/2021  14:45:37 -------------------------------------------------------------------------------- Wound Assessment Details Patient Name: Date of Service: Cleckley, [name] D. 09/15/2021 2:15 PM Medical Record Number: [id number] Patient Account Number: [id number] Date of Birth/Sex: Treating RN: 1955/02/11 (66 y.o. M) Primary Care Klani Caridi: [name] Other Clinician: Referring Jazlyn Tippens: Treating Reise Gladney/Extender: [name] in Treatment: 6 Wound Status Wound Number: 8 Primary Venous Leg Ulcer Etiology: Wound Location: Right, Lateral Malleolus Secondary Lymphedema Wounding Event: Gradually Appeared Etiology: Date Acquired: 07/22/2021 Wound Open Weeks Of Treatment: 6 Status: Clustered Wound: Yes Comorbid Sleep Apnea, Arrhythmia, Congestive Heart Failure, History: Hypertension, Hypotension, Peripheral Arterial Disease, Peripheral Venous Disease, Type II Diabetes, Gout, Osteoarthritis Photos Wound Measurements Length: (cm) 2 Width: (cm) 2 Depth: (cm) 0.1 Clustered Quantity: 1 Area: (cm) 3. Volume: (cm) 0. % Reduction in Area: 86.4% % Reduction in Volume: 93.2% Epithelialization: Medium (34-66%) Tunneling: No 142 Undermining: No 314 Wound Description Classification: Full Thickness Without Exposed Support Stru Wound Margin: Distinct, outline attached Exudate Amount: Medium Exudate Type: Serosanguineous Exudate Color: red, brown ctures Foul Odor After Cleansing: No Slough/Fibrino Yes Wound Bed Granulation Amount: Small (1-33%) Exposed Structure Granulation Quality: Red, Pink Fascia Exposed: No Necrotic Amount: Large (67-100%) Fat Layer (Subcutaneous Tissue) Exposed: Yes Necrotic Quality: Adherent Slough Tendon Exposed: No Muscle Exposed: No Joint Exposed: No Bone Exposed: No Treatment Notes Wound #8 (Malleolus) Wound Laterality: Right, Lateral Cleanser Peri-Wound Care Zinc Oxide Ointment 30g tube Discharge Instruction: Apply Zinc Oxide to  periwound with each dressing change Sween Lotion (Moisturizing lotion) Discharge Instruction: Apply moisturizing lotion as directed Topical Keystone topical compounding antibiotics Discharge Instruction: apply directly to wound bed. Primary Dressing Hydrofera Blue Ready Foam, 4x5 in Discharge Instruction: Apply to wound bed as instructed Secondary Dressing ABD Pad, 8x10 Discharge Instruction: Apply over primary dressing as directed. Zetuvit Plus 4x8 in Discharge Instruction: Apply over primary dressing as directed. Secured With Compression Wrap ThreePress (3 layer compression wrap) Discharge Instruction: Apply three layer compression as directed. Compression Stockings Add-Ons Electronic Signature(s) Signed: 09/15/2021 4:42:40 PM By: Erenest Blank Entered By: Erenest Blank on 09/15/2021 14:46:08 -------------------------------------------------------------------------------- Vitals Details Patient Name: Date of Service: Judson, [name] D. 09/15/2021 2:15 PM Medical Record Number: [id number] Patient Account Number: [id number] Date of Birth/Sex: Treating RN: 1955/05/01 (66 y.o. M) Primary Care Osie Merkin: [name] Other Clinician: Referring Avenly Roberge: Treating Lisia Westbay/Extender: [name] in Treatment: 6 Vital Signs Time Taken: 14:15 Temperature (F): 97.7 Height (in): 71 Pulse (bpm): 79 Weight (lbs): 350 Respiratory Rate (breaths/min): 18 Body Mass Index (BMI): 48.8 Blood Pressure (mmHg): 119/75 Reference Range: 80 - 120 mg / dl Electronic Signature(s) Signed: 09/15/2021 4:42:40 PM By: Erenest Blank Entered  By: Erenest Blank on 09/15/2021 14:16:01

## 2021-09-17 NOTE — Telephone Encounter (Signed)
Advanced Heart Failure Patient Advocate Encounter  [employer] requires POI. Left voicemail for patient to contact [employer] or [facility] / [facility] directly.  Clista Bernhardt, CPhT Rx Patient Advocate Phone: [phone]

## 2021-09-17 NOTE — Progress Notes (Incomplete)
ADVANCED HF CLINIC CONSULT NOTE  Referring Physician: PCP: Ginger Organ., MD HF Cardiologist: Dr. Haroldine Laws  HPI: 66 y/o male w/ h/o chronic systolic heart failure, chronic afib, OSA, HTN, HLD, type 2DM, OSA on CPAP, tobacco use and venous stasis ulcers, followed by wound clinic. Also history of chemotherapy with potential cardiotoxic effects (R-CHOP for lymphoma in 2011).    Admitted in 02/2017 for acute hypoxic respiratory failure requiring intubation and ultimately tracheostomy. This was in the setting of multifocal PNA/ influenza and new onset atrial fibrillation w/ RVR. Echo showed mod LVH and severely reduced LVEF 25-30% w/ diffuse HK. RV normal. At the time, his CM was felt to be viral. Did not get cath. Per d/c summary, DCCV not pursued. Afib treated w/ rate control and Eliquis.    He followed by cardiology post hospital. There was discussion regarding outpatient DCCV, but I don't see that this was every perused.    Repeat echo 07/2017 EF improved to 50-55%, RV mildly dilated w/ mildly reduced systolic function. He was noted to be in afib at the time of study.    08/2017, wore Zio that showed Afib, well rated controlled.    Echo 11/2018 EF 45-50%, RV mildly reduced    Unfortunately, lost to f/u by cardiology for a period of time. Had been seeing [facility] for PAD, s/p left transmetatarsal amputation. Recently saw PCP and complained of increased dyspnea and edema. Referred back to cardiology 7/23.  Torsemide was increased and he was ordered to get repeat echo that was completed on 08/23/2021 and showed EF 30-35%, severe LVH, moderately reduced RV systolic function, moderate TVR. EKG showed atrial fibrillation, right bundle branch block, heart rate 73 bpm.   On 8/17, pt presented to ED given presyncopal spell while at work. Got lightheaded, dizzy and proudly weak. No LOC. Denies CP.  Found to be hypotensive and bradycardic. Labs in the ED showed Na 137, K 5.0, creatinine 1.51 (b/l < 1.0),  mag 1.8, WBC 6.9, hemoglobin 13.0, platelets 129. Initial hsTn 17>>18. BNP 655.    Initial EKG showed atrial fibrillation w/ SVR, HR 28 BPM, RBBB. Follow up EKG showed atrial fibrillation, HR 53 BPM, RBBB. Tele showed several 2 sec pauses   He was admitted. Coreg held given bradycardia. Losartan and torsemide held w/ AKI and hypotension.   He developed worsening periods of high-grade AV block/atrial fibrillation with slow ventricular response and lengthy pauses w/ associated hypotension. Underwent placement of a temporary transvenous pacemaker via right IJ approach last night with improvement in symptoms and blood pressure.   Feels better today. Denies resting dyspnea. TSH elevated 5.8, Free T4 elevated 2.21    Scheduled for [facility] today. [facility] team consulted.    SCr higher, 1.51>>1.68. Denies CP      Echo 08/23/21 AFIB with occasional pauses noted. Left ventricular ejection fraction, by estimation, is 30 to 35%. The left ventricle has moderately decreased function. The left ventricle demonstrates global hypokinesis. There is severe left ventricular hypertrophy. Left ventricular diastolic parameters were normal. 1. Right ventricular systolic function is moderately reduced. The right ventricular size is moderately enlarged. There is mildly elevated pulmonary artery systolic pressure. The estimated right ventricular systolic pressure is 25.3 mmHg. 2. 3. Left atrial size was mildly dilated. 4. Right atrial size was mildly dilated. The mitral valve is normal in structure. Mild mitral valve regurgitation. No evidence of mitral stenosis. 5. 6. Tricuspid valve regurgitation is moderate. The aortic valve is tricuspid. There is mild calcification of  the aortic valve. There is mild thickening of the aortic valve. Aortic valve regurgitation is not visualized. Aortic valve sclerosis is present, with no evidence of aortic valve stenosis. 7. The inferior vena cava is dilated in size with >50%  respiratory variability, suggesting right atrial pressure of 8 mmHg.   Review of Systems: [y] = yes, '[ ]'$  = no    General: Weight gain '[ ]'$ ; Weight loss '[ ]'$ ; Anorexia '[ ]'$ ; Fatigue '[ ]'$ ; Fever '[ ]'$ ; Chills '[ ]'$ ; Weakness [ Y]  Cardiac: Chest pain/pressure '[ ]'$ ; Resting SOB '[ ]'$ ; Exertional SOB [Y ]; Orthopnea '[ ]'$ ; Pedal Edema '[ ]'$ ; Palpitations '[ ]'$ ; Syncope '[ ]'$ ; Presyncope [ Y]; Paroxysmal nocturnal dyspnea'[ ]'$   Pulmonary: Cough '[ ]'$ ; Wheezing'[ ]'$ ; Hemoptysis'[ ]'$ ; Sputum '[ ]'$ ; Snoring '[ ]'$   GI: Vomiting'[ ]'$ ; Dysphagia'[ ]'$ ; Melena'[ ]'$ ; Hematochezia '[ ]'$ ; Heartburn'[ ]'$ ; Abdominal pain '[ ]'$ ; Constipation '[ ]'$ ; Diarrhea '[ ]'$ ; BRBPR '[ ]'$   GU: Hematuria'[ ]'$ ; Dysuria '[ ]'$ ; Nocturia'[ ]'$   Vascular: Pain in legs with walking '[ ]'$ ; Pain in feet with lying flat '[ ]'$ ; Non-healing sores [Y ]; Stroke '[ ]'$ ; TIA '[ ]'$ ; Slurred speech '[ ]'$ ;  Neuro: Headaches'[ ]'$ ; Vertigo'[ ]'$ ; Seizures'[ ]'$ ; Paresthesias'[ ]'$ ;Blurred vision '[ ]'$ ; Diplopia '[ ]'$ ; Vision changes '[ ]'$   Ortho/Skin: Arthritis '[ ]'$ ; Joint pain '[ ]'$ ; Muscle pain '[ ]'$ ; Joint swelling '[ ]'$ ; Back Pain '[ ]'$ ; Rash '[ ]'$   Psych: Depression'[ ]'$ ; Anxiety'[ ]'$   Heme: Bleeding problems '[ ]'$ ; Clotting disorders '[ ]'$ ; Anemia '[ ]'$   Endocrine: Diabetes [Y ]; Thyroid dysfunction[Y ]   Past Medical History:  Diagnosis Date   Acute systolic HF (heart failure) (HCC)    Arthritis    lt foot, hips knees    Atrial fibrillation ([place])    Diabetes mellitus, new onset ([place])    Diverticulosis    Dyslipidemia    Gout attack 01/16/2012   Hypertension    Internal hemorrhoids    Lymphoma ([place])    remission for about 2 years, chemo 3 years prior   Pneumonia due to COVID-19 virus 07/2018   Pulmonary hypertension ([place])    Sepsis ([place]) 02/2017   secondary to influenza; requiring trach   Tubular adenoma of colon    Venous stasis ulcers (HCC)     Current Outpatient Medications  Medication Sig Dispense Refill   acetaminophen (TYLENOL) 325 MG tablet Take 650 mg by mouth every 6 (six) hours as needed for moderate pain.       albuterol (VENTOLIN HFA) 108 (90 Base) MCG/ACT inhaler Inhale 2 puffs into the lungs every 6 (six) hours as needed for wheezing or shortness of breath. (Patient not taking: Reported on 08/26/2021) 1 each 2   ascorbic acid (VITAMIN C) 500 MG tablet Take 1 tablet (500 mg total) by mouth 3 (three) times daily. (Patient taking differently: Take 500 mg by mouth 2 (two) times daily.) 30 tablet 0   aspirin 81 MG chewable tablet Chew 1 tablet (81 mg total) by mouth daily. 30 tablet 11   atorvastatin (LIPITOR) 40 MG tablet TAKE 1 TABLET BY MOUTH EVERY DAY *PT OVERDUE FOR OFFICE VISIT* 30 tablet 11   cholecalciferol (VITAMIN D) 25 MCG (1000 UNIT) tablet Take 1 tablet (1,000 Units total) by mouth daily. 30 tablet 0   colchicine 0.6 MG tablet Take 0.6 mg by mouth 2 (two) times daily as needed.     Continuous Blood Gluc Receiver (FREESTYLE LIBRE 2 READER) DEVI 1 each by  Does not apply route daily. 1 each 3   Continuous Blood Gluc Sensor (FREESTYLE LIBRE 2 SENSOR) MISC 1 each by Does not apply route daily. 1 each 3   dapagliflozin propanediol (FARXIGA) 10 MG TABS tablet Take 1 tablet (10 mg total) by mouth daily. 30 tablet 11   ELIQUIS 5 MG TABS tablet TAKE 1 TABLET BY MOUTH TWICE A DAY 180 tablet 1   levofloxacin (LEVAQUIN) 750 MG tablet Take 750 mg by mouth daily.     Omega 3 340 MG CPDR Take 1 capsule (340 mg total) by mouth 2 (two) times daily. (Patient taking differently: Take 340 mg by mouth daily.) 60 capsule 0   omeprazole (PRILOSEC) 40 MG capsule Take 1 capsule (40 mg total) by mouth daily. 30 capsule 0   polycarbophil (FIBERCON) 625 MG tablet Take 1 tablet (625 mg total) by mouth daily. (Patient not taking: Reported on 08/26/2021) 30 tablet 0   sacubitril-valsartan (ENTRESTO) 24-26 MG Take 1 tablet by mouth 2 (two) times daily. 180 tablet 3   SANTYL 250 UNIT/GM ointment APPLY 1 APPLICATION. TOPICALLY DAILY. 90 g 0   sertraline (ZOLOFT) 50 MG tablet Take 1 tablet (50 mg total) by mouth daily. 30 tablet 0    spironolactone (ALDACTONE) 25 MG tablet Take 1 tablet (25 mg total) by mouth daily. 30 tablet 6   Tirzepatide (MOUNJARO [place]) Inject 1 Dose into the skin once a week.     torsemide (DEMADEX) 20 MG tablet Take 1 tablet (20 mg total) by mouth daily. 30 tablet 6   vitamin B-12 (CYANOCOBALAMIN) 1000 MCG tablet Take 1 tablet (1,000 mcg total) by mouth daily. 30 tablet 0   No current facility-administered medications for this visit.    Allergies  Allergen Reactions   Latex Hives and Swelling   Ace Inhibitors     Other reaction(s): cough   Penicillin G     Other reaction(s): rash Has tolerated amoxicillin      Social History   Socioeconomic History   Marital status: Divorced    Spouse name: Not on file   Number of children: Not on file   Years of education: Not on file   Highest education level: Not on file  Occupational History   Occupation: cosmetologist  Tobacco Use   Smoking status: Former    Packs/day: 0.50    Years: 20.00    Total pack years: 10.00    Types: Cigarettes    Quit date: 01/10/2009    Years since quitting: 12.6   Smokeless tobacco: Never  Vaping Use   Vaping Use: Never used  Substance and Sexual Activity   Alcohol use: Not Currently    Comment: 2-3 times per week   Drug use: No   Sexual activity: Not on file  Other Topics Concern   Not on file  Social History Narrative   Not on file   Social Determinants of Health   Financial Resource Strain: Not on file  Food Insecurity: Not on file  Transportation Needs: Not on file  Physical Activity: Not on file  Stress: Not on file  Social Connections: Not on file  Intimate Partner Violence: Not on file      Family History  Problem Relation Age of Onset   Dementia Brother 25       frontal lobe    Dementia Mother    Arthritis Mother    Hypertension Mother    CVA Sister 45   Colon cancer Neg Hx    Pancreatic  cancer Neg Hx    Stomach cancer Neg Hx    Rectal cancer Neg Hx    Liver cancer Neg Hx      There were no vitals filed for this visit.  PHYSICAL EXAM: General:  Well appearing. No respiratory difficulty HEENT: normal Neck: supple. no JVD. Carotids 2+ bilat; no bruits. No lymphadenopathy or thryomegaly appreciated. Cor: PMI nondisplaced. Regular rate & rhythm. No rubs, gallops or murmurs. Lungs: clear Abdomen: soft, nontender, nondistended. No hepatosplenomegaly. No bruits or masses. Good bowel sounds. Extremities: no cyanosis, clubbing, rash, edema Neuro: alert & oriented x 3, cranial nerves grossly intact. moves all 4 extremities w/o difficulty. Affect pleasant.  ECG:   ASSESSMENT & PLAN: Acute on Chronic Systolic Biventricular Heart Failure - Echo 02/2017 EF 25-30% w/ diffuse HK. RV normal. Felt to be viral CM +/- component of tachymediated in setting of influenza PNA and rapid Afib  - Echo 07/2017 EF recovered, 50-55%, RV mildly reduced - Echo 11/2018 EF 45-50%, RV mildly reduced (in rate controlled afib)  - Echo 08/23/21 EF 30-35%, severe LVH, moderately reduced RV systolic function, moderate TVR - NYHA Class III, confounded by obesity  - R/L cath 08/27/21. Diffuse non-obstructive CAD with 90% lesion in dLAD - RHC cath with biventricular failure R>L: RA 21 PA 56/22 (36) PCW 24 Fick 7.3/2.7 PaPi 2.1 - given concomitant conduction disease, profound bradycardia and LVH, will need to consider amyloid workup. Can complete as outpatient. - GDMT initially limited by AKI and hypotension, now resolved   - continue GDMT titration as tolerated.  - Continue Entresto 24-26 mg bid (titrate further as outpatient if room) - Increase spiro to 25 mg daily - Continue Farxiga 10 mg daily  - Continue Torsemide 20 mg daily  - now w/ PPM, may consider ? blocker/digoxin further down the road if needed     2. Profound Bradycardia w/ High-grade AV block - s/p placement of TVP  - off coreg - has known RBBB  - LHC w/o high grade CAD  - pacer dependent, s/p BiV ICD  - appreciate EP's  assistance    3. Chronic Afib - dates back to at least 2019, has been historically well rate controlled - has never had attempt at DCCV. Given chronicity , tx w/ rhythm control at this point may be difficult  - size may prohibit ablation  - continue tx of OSA w/ CPAP  - Eliquis has been on hold. Completed procedures. Resume Eliquis evening of 08/27 per EP   4. AKI due to ATN - b/w Scr 0.9 - 1.51 on admit, >>1.68 -> 1.48 -> 0.94 ->0.76->0.86->0.80>0.68 - suspect 2/2 hypotension/ bradycardia - follow BMP    5. PAD/ Chronic Venous Stasis Wounds  - s/p left transmetatarsal amputation - followed by [facility] and Wound clinic  - Seen by [place] RN. Wounds felt to be chronic 2/2  lymphedema. - Bedside RN to conduct dressing changes. Will need f/u in wound clinic post d/c  - on levofloxacin 750 mg qd    6. H/o Lymphoma - s/p R-CHOP in 2011 (potential cardiotoxic effects)    7. Type 2DM - Hgb A1c 6.0    8. CAD - diffuse mostly non-obstructive CAD on cath. Not cause of CM - medical management with ASA/statin  - If has angina can consider PCI PDA   9. Morbid obesity - needs weight loss - consider GLP1RA as outpatient   10. Hypertension - Improving w/ Delene Loll  - Further titration as tolerated  11. Debility/ Weakness - Needs aggressive PT/OT - HH PT recommended at discharge   Okay for discharge from HF perspective. Will arrange for clinic f/u.   HF medications Eliquis 5 BID (resume evening of 08/27) Arlyce Harman 25 mg daily Torsemide 20 mg daily Entresto 24/26 mg BID Farxiga 10 mg daily Atorvastatin 40 mg daily Aspirin 81 mg daily

## 2021-09-20 ENCOUNTER — Ambulatory Visit (HOSPITAL_COMMUNITY)
Admit: 2021-09-20 | Discharge: 2021-09-20 | Disposition: A | Discharge status: Home / Self Care | Payer: Medicare Other | Attending: Family Medicine | Admitting: Family Medicine

## 2021-09-20 ENCOUNTER — Inpatient Hospital Stay (HOSPITAL_COMMUNITY): Admit: 2021-09-20 | Discharge: 2021-09-20 | Disposition: A | Discharge status: Home / Self Care | Payer: Medicare Other

## 2021-09-20 ENCOUNTER — Encounter (HOSPITAL_COMMUNITY): Payer: Self-pay

## 2021-09-20 VITALS — BP 124/82 | HR 75 | Ht 71.0 in | Wt 297.6 lb

## 2021-09-20 DIAGNOSIS — Z7901 Long term (current) use of anticoagulants: Secondary | ICD-10-CM | POA: Insufficient documentation

## 2021-09-20 DIAGNOSIS — I10 Essential (primary) hypertension: Secondary | ICD-10-CM

## 2021-09-20 DIAGNOSIS — I428 Other cardiomyopathies: Secondary | ICD-10-CM | POA: Diagnosis not present

## 2021-09-20 DIAGNOSIS — I442 Atrioventricular block, complete: Secondary | ICD-10-CM

## 2021-09-20 DIAGNOSIS — Z6841 Body Mass Index (BMI) 40.0 and over, adult: Secondary | ICD-10-CM | POA: Insufficient documentation

## 2021-09-20 DIAGNOSIS — L98499 Non-pressure chronic ulcer of skin of other sites with unspecified severity: Secondary | ICD-10-CM

## 2021-09-20 DIAGNOSIS — Z7984 Long term (current) use of oral hypoglycemic drugs: Secondary | ICD-10-CM | POA: Insufficient documentation

## 2021-09-20 DIAGNOSIS — Z9221 Personal history of antineoplastic chemotherapy: Secondary | ICD-10-CM | POA: Diagnosis not present

## 2021-09-20 DIAGNOSIS — I4510 Unspecified right bundle-branch block: Secondary | ICD-10-CM | POA: Diagnosis not present

## 2021-09-20 DIAGNOSIS — G4733 Obstructive sleep apnea (adult) (pediatric): Secondary | ICD-10-CM | POA: Diagnosis not present

## 2021-09-20 DIAGNOSIS — R5381 Other malaise: Secondary | ICD-10-CM

## 2021-09-20 DIAGNOSIS — Z95810 Presence of automatic (implantable) cardiac defibrillator: Secondary | ICD-10-CM | POA: Diagnosis not present

## 2021-09-20 DIAGNOSIS — I272 Pulmonary hypertension, unspecified: Secondary | ICD-10-CM | POA: Insufficient documentation

## 2021-09-20 DIAGNOSIS — I5082 Biventricular heart failure: Secondary | ICD-10-CM | POA: Insufficient documentation

## 2021-09-20 DIAGNOSIS — I5022 Chronic systolic (congestive) heart failure: Secondary | ICD-10-CM | POA: Diagnosis not present

## 2021-09-20 DIAGNOSIS — I482 Chronic atrial fibrillation, unspecified: Secondary | ICD-10-CM | POA: Insufficient documentation

## 2021-09-20 DIAGNOSIS — I2510 Atherosclerotic heart disease of native coronary artery without angina pectoris: Secondary | ICD-10-CM | POA: Diagnosis not present

## 2021-09-20 DIAGNOSIS — Z8572 Personal history of non-Hodgkin lymphomas: Secondary | ICD-10-CM | POA: Insufficient documentation

## 2021-09-20 DIAGNOSIS — Z7982 Long term (current) use of aspirin: Secondary | ICD-10-CM | POA: Insufficient documentation

## 2021-09-20 DIAGNOSIS — Z79899 Other long term (current) drug therapy: Secondary | ICD-10-CM | POA: Insufficient documentation

## 2021-09-20 DIAGNOSIS — E119 Type 2 diabetes mellitus without complications: Secondary | ICD-10-CM | POA: Insufficient documentation

## 2021-09-20 DIAGNOSIS — I5020 Unspecified systolic (congestive) heart failure: Secondary | ICD-10-CM

## 2021-09-20 DIAGNOSIS — I110 Hypertensive heart disease with heart failure: Secondary | ICD-10-CM | POA: Diagnosis not present

## 2021-09-20 DIAGNOSIS — I4821 Permanent atrial fibrillation: Secondary | ICD-10-CM

## 2021-09-20 DIAGNOSIS — I739 Peripheral vascular disease, unspecified: Secondary | ICD-10-CM | POA: Diagnosis not present

## 2021-09-20 DIAGNOSIS — E11622 Type 2 diabetes mellitus with other skin ulcer: Secondary | ICD-10-CM

## 2021-09-20 LAB — BRAIN NATRIURETIC PEPTIDE: B Natriuretic Peptide: 433.5 pg/mL — ABNORMAL HIGH (ref 0.0–100.0)

## 2021-09-20 LAB — CBC
HCT: 47.8 % (ref 39.0–52.0)
Hemoglobin: 15.3 g/dL (ref 13.0–17.0)
MCH: 29.6 pg (ref 26.0–34.0)
MCHC: 32 g/dL (ref 30.0–36.0)
MCV: 92.5 fL (ref 80.0–100.0)
Platelets: 223 10*3/uL (ref 150–400)
RBC: 5.17 MIL/uL (ref 4.22–5.81)
RDW: 16.9 % — ABNORMAL HIGH (ref 11.5–15.5)
WBC: 7.6 10*3/uL (ref 4.0–10.5)
nRBC: 0 % (ref 0.0–0.2)

## 2021-09-20 LAB — BASIC METABOLIC PANEL
Anion gap: 10 (ref 5–15)
BUN: 16 mg/dL (ref 8–23)
CO2: 26 mmol/L (ref 22–32)
Calcium: 9.5 mg/dL (ref 8.9–10.3)
Chloride: 103 mmol/L (ref 98–111)
Creatinine, Ser: 0.94 mg/dL (ref 0.61–1.24)
GFR, Estimated: >60 mL/min (ref >60)
Glucose, Bld: 112 mg/dL — ABNORMAL HIGH (ref 70–99)
Potassium: 4.4 mmol/L (ref 3.5–5.1)
Sodium: 139 mmol/L (ref 135–145)

## 2021-09-20 MED ORDER — ENTRESTO 49-51 MG PO TABS: 1.0000 | ORAL_TABLET | Freq: Two times a day (BID) | ORAL | 6 refills | Status: DC

## 2021-09-20 MED ORDER — TORSEMIDE 20 MG PO TABS: 20.0000 mg | ORAL_TABLET | ORAL | 6 refills | Status: DC | PRN

## 2021-09-20 NOTE — Addendum Note (Signed)
Encounter addended by: Rafael Bihari, FNP on: 09/20/2021 11:29 AM  Actions taken: Clinical Note Signed

## 2021-09-20 NOTE — Patient Instructions (Addendum)
Thank you for coming in today  Labs were done today, if any labs are abnormal the clinic will call you No news is good news  CHANGE Torsemide to 20 mg as needed daily   INCREASE Entresto to 49/51 mg 1 tablet twice daily  [facility] with the prescription you been given for a urine immunofixiation  Your physician recommends that you return for lab work in: 2 weeks BMET  Your physician recommends that you schedule a follow-up appointment in:  4 weeks in clinic 12 weeks with Dr. Haroldine Laws with echocardiogram  Your physician has requested that you have an echocardiogram. Echocardiography is a painless test that uses sound waves to create images of your heart. It provides your doctor with information about the size and shape of your heart and how well your heart's chambers and valves are working. This procedure takes approximately one hour. There are no restrictions for this procedure.     Do the following things EVERYDAY: Weigh yourself in the morning before breakfast. Write it down and keep it in a log. Take your medicines as prescribed Eat low salt foods--Limit salt (sodium) to 2000 mg per day.  Stay as active as you can everyday Limit all fluids for the day to less than 2 liters  At the [facility], you and your health needs are our priority. As part of our continuing mission to provide you with exceptional heart care, we have created designated Provider Care Teams. These Care Teams include your primary Cardiologist (physician) and Advanced Practice Providers (APPs- Physician Assistants and Nurse Practitioners) who all work together to provide you with the care you need, when you need it.   You may see any of the following providers on your designated Care Team at your next follow up: Dr Glori Bickers Dr Loralie Champagne Dr. Roxana Hires, NP Lyda Jester, [place] [facility] [place], [place] Forestine Na, NP Audry Riles, PharmD   Please  be sure to bring in all your medications bottles to every appointment.   If you have any questions or concerns before your next appointment please send [place] a message through [place] or call our office at [phone].    TO LEAVE A MESSAGE FOR THE NURSE SELECT OPTION 2, PLEASE LEAVE A MESSAGE INCLUDING: YOUR NAME DATE OF BIRTH CALL BACK NUMBER REASON FOR CALL**this is important as we prioritize the call backs  YOU WILL RECEIVE A CALL BACK THE SAME DAY AS LONG AS YOU CALL BEFORE 4:00 PM

## 2021-09-22 ENCOUNTER — Encounter (HOSPITAL_BASED_OUTPATIENT_CLINIC_OR_DEPARTMENT_OTHER): Payer: Medicare Other | Admitting: Physician Assistant

## 2021-09-22 DIAGNOSIS — Z6841 Body Mass Index (BMI) 40.0 and over, adult: Secondary | ICD-10-CM | POA: Diagnosis not present

## 2021-09-22 DIAGNOSIS — E11621 Type 2 diabetes mellitus with foot ulcer: Secondary | ICD-10-CM | POA: Diagnosis not present

## 2021-09-22 DIAGNOSIS — L97518 Non-pressure chronic ulcer of other part of right foot with other specified severity: Secondary | ICD-10-CM | POA: Diagnosis not present

## 2021-09-22 DIAGNOSIS — E1151 Type 2 diabetes mellitus with diabetic peripheral angiopathy without gangrene: Secondary | ICD-10-CM | POA: Diagnosis not present

## 2021-09-22 DIAGNOSIS — L97828 Non-pressure chronic ulcer of other part of left lower leg with other specified severity: Secondary | ICD-10-CM | POA: Diagnosis not present

## 2021-09-22 DIAGNOSIS — Z89432 Acquired absence of left foot: Secondary | ICD-10-CM | POA: Diagnosis not present

## 2021-09-22 DIAGNOSIS — L97312 Non-pressure chronic ulcer of right ankle with fat layer exposed: Secondary | ICD-10-CM | POA: Diagnosis not present

## 2021-09-22 DIAGNOSIS — I890 Lymphedema, not elsewhere classified: Secondary | ICD-10-CM | POA: Diagnosis not present

## 2021-09-22 DIAGNOSIS — I87333 Chronic venous hypertension (idiopathic) with ulcer and inflammation of bilateral lower extremity: Secondary | ICD-10-CM | POA: Diagnosis not present

## 2021-09-22 DIAGNOSIS — L97818 Non-pressure chronic ulcer of other part of right lower leg with other specified severity: Secondary | ICD-10-CM | POA: Diagnosis not present

## 2021-09-22 DIAGNOSIS — I87331 Chronic venous hypertension (idiopathic) with ulcer and inflammation of right lower extremity: Secondary | ICD-10-CM | POA: Diagnosis not present

## 2021-09-22 DIAGNOSIS — I872 Venous insufficiency (chronic) (peripheral): Secondary | ICD-10-CM | POA: Diagnosis not present

## 2021-09-22 NOTE — Progress Notes (Addendum)
[name] ([id number]) , Visit Report for 09/22/2021 Chief Complaint Document Details Patient Name: Date of Service: [name], [name] 09/22/2021 3:00 PM Medical Record Number: [id number] Patient Account Number: [id number] Date of Birth/Sex: Treating RN: [birth year] (66 y.o. M) Primary Care Provider: [name] Other Clinician: Referring Provider: Treating Provider/Extender: [name] in Treatment: 7 Information Obtained from: Patient Chief Complaint 08/04/2021; patient returns to clinic with bilateral leg wounds as well as areas on the right foot Electronic Signature(s) Signed: 09/22/2021 3:05:06 PM By: [name] PA-C Entered By: [name] on 09/22/2021 15:05:06 -------------------------------------------------------------------------------- HPI Details Patient Name: Date of Service: [name], [name] 09/22/2021 3:00 PM Medical Record Number: [id number] Patient Account Number: [id number] Date of Birth/Sex: Treating RN: [birth year] (66 y.o. M) Primary Care Provider: [name] Other Clinician: Referring Provider: Treating Provider/Extender: [name] in Treatment: 7 History of Present Illness HPI Description: ADMISSION 03/22/2021 This is a 66 year old man with a past medical history significant for diabetes type 2, congestive heart failure, peripheral arterial disease, morbid obesity, venous insufficiency, and coronary artery disease. He has been followed by [name] in podiatry, who performed a transmetatarsal amputation on the left foot in August 2022. He had issues healing that wound, but based upon Dr. [name] notes, ultimately the TMA wound healed. During his recovery from that surgery, however, ulcers opened up over the DIP joint of the right second and third toe. These have apparently closed and reopened multiple times. It sounds like one of the issues has been moisture accumulation and maceration of the tissues  causing them to reopen. At his last visit with [name], on March 01, 2021, there continues to be problems with moisture and he was referred to wound care for further evaluation and management. He had a formal aortogram with runoff performed prior to his TMA. The findings are copied here: Patient has inline flow to both feet with no significant flow-limiting lesion that would be amenable to percutaneous or open revascularization. He does have an element of small vessel disease and has a short segment occlusion of the distal anterior tibial/dorsalis pedis artery on the left foot but does have posterior tibial artery flow. Would recommend management of wounds with amputation of toes 2 and 3 on the right foot if the wounds do not heal and deteriorate. Transmetatarsal amputation on the left side has as good a blood supply as it is going to get and hopefully this will heal in the future. Formal ABIs were done in January 2023. They are normal bilaterally. ABI Findings: +---------+------------------+-----+----------+--------+ Right Rt Pressure (mmHg)IndexWaveform Comment  +---------+------------------+-----+----------+--------+ Brachial 160     +---------+------------------+-----+----------+--------+ PTA 192 1.06 monophasic  +---------+------------------+-----+----------+--------+ DP 159 0.88 monophasic  +---------+------------------+-----+----------+--------+ Great T oe145 0.80    +---------+------------------+-----+----------+--------+ +--------+------------------+-----+---------+-------+ Left Lt Pressure (mmHg)IndexWaveform Comment +--------+------------------+-----+---------+-------+ [id number]     +--------+------------------+-----+---------+-------+ PTA 204 1.13 triphasic  +--------+------------------+-----+---------+-------+ DP 194 1.07 biphasic    +--------+------------------+-----+---------+-------+ +-------+-----------+-----------+------------+------------+ ABI/TBIT oday's ABIT oday's TBIPrevious ABIPrevious TBI +-------+-----------+-----------+------------+------------+ Right 1.06 0.8 1.26 0.65  +-------+-----------+-----------+------------+------------+ Left 1.13 amputation 1.15 amputation  +-------+-----------+-----------+------------+------------+ Previous ABI on 08/06/20 at [facility] Pedal pressures falsely elevated due to medial calcification. Summary: Right: Resting right ankle-brachial index is within normal range. The right toe-brachial index is normal. Left: Resting left ankle-brachial index is within normal range. READMISSION 08/04/2021 Mr. [name] is now a 66 year old man who I remember from this clinic many years ago I think he had a right lower extremity predominantly venous wound at  the time. He was here for 1 visit in March of this year had wounds on his right second and third toes we apparently dressed them many and they healed so he did not come back. He is listed in [place] is being a diabetic although the patient denies this says he is verified it with his primary doctor. In any case over the last several weeks or so according the patient although these wounds look somewhat more chronic than that he has developed predominantly large wounds on the right medial and right lateral ankle smaller areas on the left leg and areas on the dorsal aspect of his right second and third toes. Its not clear how he has been dressing these. 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 The patient has known PAD. He had an angiogram in August 2022 at which time he had nonhealing wounds in both feet. On the left his major vessels in the thigh were all patent. He had three-vessel patent to the level of the ankle. He had a very short  occlusion in the left anterior tibial. On the right lower extremity the major vessels in his thigh were all patent. He had three-vessel runoff to the foot sluggish filling of the anterior tibial artery. He was felt to have some component of small vessel disease but nothing that was amenable or needed revascularization. It was recommended that he have amputation of the second and third toes on the right foot if they did not heal He has been following with [name] of podiatry. [name] got him juxta lite stockings although I do not think he had them on properly he has uncontrolled edema in both legs Past medical history includes type 2 diabetes [although the patient really denies this], left TMA in 2022,lower extremity wounds in fact attendance at this clinic in 2009-2010, A-fib on Eliquis, chronic venous insufficiency with secondary lymphedema history of non-Hodgkin's lymphoma. 08-11-2021 upon evaluation today patient presents for follow-up evaluation he was seen last Wednesday initially for inspection here in our clinic. With that being said he tells me that he unfortunately has been having a lot of drainage and is actually coming through his wrap. Fortunately I do not see any evidence of active infection locally or systemically at this time which is great news. No fevers, chills, nausea, vomiting, or diarrhea. With that being said there does appear to be some evidence of local infection based on what I am seeing today. 08-18-2021 upon evaluation today patient appears to be doing okay currently in regard to his wounds with that being said that he is doing much better but still has a long ways to go to get to where he wanted to be. I think the infection is significantly improved. He has another week of the antibiotic at this point. 08-25-2021 upon evaluation today patient's wounds are actually doing decently well he has erythema has significantly improved. I think the cellulitis is under controlled I  am going to continue him on 2 more weeks of the Levaquin at 500 mg this is a lower dose but I am hoping it will be better for him. 09-08-2021 upon evaluation today patient appears to be doing excellent in regard to his wounds. Since I last saw him he was actually in the hospital where he ended up having a pacemaker put in. Subsequently he tells me that he is actually doing quite well although they were  unsure whether they were going to do it due to the fact that he had the wounds on his legs. And then I am glad they did anything seem to be doing well. 09-15-2021 upon evaluation patient's wounds are actually showing signs of improvement. The right side wounds do appear to have some need for sharp debridement today and I Georgina Peer go ahead and proceed with that. I think that if we get the wounds cleaned up he will actually show signs of continued improvement. I am also leaning towards switching to [facility] which I think will be a much better option for him. 09-22-2021 patient's wounds are showing signs of excellent improvement. I am actually extremely pleased with where we stand and I think that the patient is making great progress. There does not appear to be any signs of active infection. Electronic Signature(s) Signed: 09/24/2021 1:52:33 PM By: [name] PA-C Entered By: [name] on 09/24/2021 13:52:33 -------------------------------------------------------------------------------- Chemical Cauterization Details Patient Name: Date of Service: [place]ke, [name] 09/22/2021 3:00 PM Medical Record Number: [id number] Patient Account Number: [id number] Date of Birth/Sex: Treating RN: 1955/05/24 (66 y.o. [name] Primary Care Provider: [name] Other Clinician: Referring Provider: Treating Provider/Extender: [name] in Treatment: 7 Procedure Performed for: Wound #7 Right,Medial Malleolus Performed By: Physician [name], PA Post Procedure  Diagnosis Same as Pre-procedure Notes silver nitrate used. Electronic Signature(s) Signed: 09/22/2021 5:46:43 PM By: [name] PA-C Signed: 09/22/2021 5:59:54 PM By: Deon Pilling RN, BSN Entered By: Deon Pilling on 09/22/2021 16:16:14 -------------------------------------------------------------------------------- Physical Exam Details Patient Name: Date of Service: Weiss, [name] 09/22/2021 3:00 PM Medical Record Number: [id number] Patient Account Number: [id number] Date of Birth/Sex: Treating RN: [birth year] (66 y.o. M) Primary Care Provider: [name] Other Clinician: Referring Provider: Treating Provider/Extender: [name] in Treatment: 7 Constitutional Obese and well-hydrated in no acute distress. Respiratory normal breathing without difficulty. Psychiatric this patient is able to make decisions and demonstrates good insight into disease process. Alert and Oriented x 3. pleasant and cooperative. Notes Patient's wounds did not require active sharp debridement today. He actually was able to be cleaned off with just saline and gauze and no sharp debridement. Post this cleaned it off and debridement he actually was significantly improved overall I am extremely pleased with where we stand. Electronic Signature(s) Signed: 09/24/2021 1:52:50 PM By: [name] PA-C Entered By: [name] on 09/24/2021 13:52:50 -------------------------------------------------------------------------------- Physician Orders Details Patient Name: Date of Service: [place], [name] 09/22/2021 3:00 PM Medical Record Number: [id number] Patient Account Number: [id number] Date of Birth/Sex: Treating RN: [birth year] (66 y.o. [name], [name] Primary Care Provider: [name] Other Clinician: Referring Provider: Treating Provider/Extender: [name] in Treatment: 7 Verbal / Phone Orders: No Diagnosis Coding ICD-10 Coding Code  Description I89.0 Lymphedema, not elsewhere classified I87.333 Chronic venous hypertension (idiopathic) with ulcer and inflammation of bilateral lower extremity L97.828 Non-pressure chronic ulcer of other part of left lower leg with other specified severity L97.818 Non-pressure chronic ulcer of other part of right lower leg with other specified severity E11.621 Type 2 diabetes mellitus with foot ulcer L97.518 Non-pressure chronic ulcer of other part of right foot with other specified severity Follow-up Appointments ppointment in 1 week. Margarita Grizzle and [place], Room 8 [facility] Return A ppointment in 2 weeks. Margarita Grizzle and [place], Room 8 [name] Return A Other: - [place] topical compounding antibiotics- bring to  your appointments. Anesthetic (In clinic) Topical Lidocaine 5% applied to wound bed Bathing/ Shower/ Hygiene May shower with protection but do not get wound dressing(s) wet. Edema Control - Lymphedema / SCD / Other Elevate legs to the level of the heart or above for 30 minutes daily and/or when sitting, a frequency of: - 3-4 times a day throughout the day. Avoid standing for long periods of time. Exercise regularly Wound Treatment Wound #12 - Lower Leg Wound Laterality: Left, Anterior Peri-Wound Care: Triamcinolone 15 (g) 1 x Per Week/30 Days Discharge Instructions: Use triamcinolone 15 (g) as directed Peri-Wound Care: Zinc Oxide Ointment 30g tube 1 x Per Week/30 Days Discharge Instructions: Apply Zinc Oxide to periwound with each dressing change Peri-Wound Care: Sween Lotion (Moisturizing lotion) 1 x Per Week/30 Days Discharge Instructions: Apply moisturizing lotion as directed Topical: [place]stone topical compounding antibiotics 1 x Per Week/30 Days Discharge Instructions: apply directly to wound bed. Prim Dressing: KerraCel Ag Gelling Fiber Dressing, 2x2 in (silver alginate) 1 x Per Week/30 Days ary Discharge Instructions: Apply silver alginate to wound bed as  instructed Secondary Dressing: ABD Pad, 8x10 1 x Per Week/30 Days Discharge Instructions: Apply over primary dressing as directed. Secondary Dressing: Optifoam Non-Adhesive Dressing, 4x4 in 1 x Per Week/30 Days Discharge Instructions: Apply TO LEFT ANTERIOR ANKLE TO PROTECT ANKLE. Compression Wrap: ThreePress (3 layer compression wrap) 1 x Per Week/30 Days Discharge Instructions: Apply three layer compression as directed. Wound #7 - Malleolus Wound Laterality: Right, Medial Peri-Wound Care: Triamcinolone 15 (g) 1 x Per Week/30 Days Discharge Instructions: Use triamcinolone 15 (g) as directed Peri-Wound Care: Zinc Oxide Ointment 30g tube 1 x Per Week/30 Days Discharge Instructions: Apply Zinc Oxide to periwound with each dressing change Peri-Wound Care: Sween Lotion (Moisturizing lotion) 1 x Per Week/30 Days Discharge Instructions: Apply moisturizing lotion as directed Topical: Keystone topical compounding antibiotics 1 x Per Week/30 Days Discharge Instructions: apply directly to wound bed. Prim Dressing: KerraCel Ag Gelling Fiber Dressing, 4x5 in (silver alginate) 1 x Per Week/30 Days ary Discharge Instructions: Apply silver alginate to wound bed as instructed Secondary Dressing: ABD Pad, 8x10 1 x Per Week/30 Days Discharge Instructions: Apply over primary dressing as directed. Compression Wrap: ThreePress (3 layer compression wrap) 1 x Per Week/30 Days Discharge Instructions: Apply three layer compression as directed. Electronic Signature(s) Signed: 09/22/2021 5:46:43 PM By: [name] PA-C Signed: 09/22/2021 5:59:54 PM By: Deon Pilling RN, BSN Entered By: Deon Pilling on 09/22/2021 16:18:50 -------------------------------------------------------------------------------- Problem List Details Patient Name: Date of Service: [name], [name] 09/22/2021 3:00 PM Medical Record Number: [id number] Patient Account Number: [id number] Date of Birth/Sex: Treating RN: [birth year] (66 y.o.  M) Primary Care Provider: [name] Other Clinician: Referring Provider: Treating Provider/Extender: [name] in Treatment: 7 Active Problems ICD-10 Encounter Code Description Active Date MDM Diagnosis I89.0 Lymphedema, not elsewhere classified 08/04/2021 No Yes I87.333 Chronic venous hypertension (idiopathic) with ulcer and inflammation of 08/04/2021 No Yes bilateral lower extremity L97.828 Non-pressure chronic ulcer of other part of left lower leg with other specified 08/04/2021 No Yes severity L97.818 Non-pressure chronic ulcer of other part of right lower leg with other specified 08/04/2021 No Yes severity E11.621 Type 2 diabetes mellitus with foot ulcer 08/04/2021 No Yes L97.518 Non-pressure chronic ulcer of other part of right foot with other specified 08/04/2021 No Yes severity Inactive Problems Resolved Problems Electronic Signature(s) Signed: 09/22/2021 3:04:57 PM By: [name] PA-C Entered By: [name] on 09/22/2021 15:04:57 -------------------------------------------------------------------------------- Progress Note  Details Patient Name: Date of Service: [name], [name] 09/22/2021 3:00 PM Medical Record Number: [id number] Patient Account Number: [id number] Date of Birth/Sex: Treating RN: December 06, 1955 (66 y.o. M) Primary Care Provider: [name] Other Clinician: Referring Provider: Treating Provider/Extender: [name] in Treatment: 7 Subjective Chief Complaint Information obtained from Patient 08/04/2021; patient returns to clinic with bilateral leg wounds as well as areas on the right foot History of Present Illness (HPI) ADMISSION 03/22/2021 This is a 66 year old man with a past medical history significant for diabetes type 2, congestive heart failure, peripheral arterial disease, morbid obesity, venous insufficiency, and coronary artery disease. He has been followed by [name] in podiatry, who  performed a transmetatarsal amputation on the left foot in August 2022. He had issues healing that wound, but based upon Dr. [name] notes, ultimately the TMA wound healed. During his recovery from that surgery, however, ulcers opened up over the DIP joint of the right second and third toe. These have apparently closed and reopened multiple times. It sounds like one of the issues has been moisture accumulation and maceration of the tissues causing them to reopen. At his last visit with [name], on March 01, 2021, there continues to be problems with moisture and he was referred to wound care for further evaluation and management. He had a formal aortogram with runoff performed prior to his TMA. The findings are copied here: Patient has inline flow to both feet with no significant flow-limiting lesion that would be amenable to percutaneous or open revascularization. He does have an element of small vessel disease and has a short segment occlusion of the distal anterior tibial/dorsalis pedis artery on the left foot but does have posterior tibial artery flow. Would recommend management of wounds with amputation of toes 2 and 3 on the right foot if the wounds do not heal and deteriorate. Transmetatarsal amputation on the left side has as good a blood supply as it is going to get and hopefully this will heal in the future. Formal ABIs were done in January 2023. They are normal bilaterally. ABI Findings: +---------+------------------+-----+----------+--------+ Right Rt Pressure (mmHg)IndexWaveform Comment  +---------+------------------+-----+----------+--------+ Brachial 160    +---------+------------------+-----+----------+--------+ PTA 192 1.06 monophasic  +---------+------------------+-----+----------+--------+ DP 159 0.88 monophasic  +---------+------------------+-----+----------+--------+ Great T oe145 0.80     +---------+------------------+-----+----------+--------+ +--------+------------------+-----+---------+-------+ Left Lt Pressure (mmHg)IndexWaveform Comment +--------+------------------+-----+---------+-------+ [id number]    +--------+------------------+-----+---------+-------+ PTA 204 1.13 triphasic  +--------+------------------+-----+---------+-------+ DP 194 1.07 biphasic   +--------+------------------+-----+---------+-------+ +-------+-----------+-----------+------------+------------+ ABI/TBIT oday's ABIT oday's TBIPrevious ABIPrevious TBI +-------+-----------+-----------+------------+------------+ Right 1.06 0.8 1.26 0.65  +-------+-----------+-----------+------------+------------+ Left 1.13 amputation 1.15 amputation  +-------+-----------+-----------+------------+------------+ Previous ABI on 08/06/20 at [facility] Pedal pressures falsely elevated due to medial calcification. Summary: Right: Resting right ankle-brachial index is within normal range. The right toe-brachial index is normal. Left: Resting left ankle-brachial index is within normal range. READMISSION 08/04/2021 Mr. Ortwein is now a 66 year old man who I remember from this clinic many years ago I think he had a right lower extremity predominantly venous wound at the time. He was here for 1 visit in March of this year had wounds on his right second and third toes we apparently dressed them many and they healed so he did not come back. He is listed in [place] is being a diabetic although the patient denies this says he is verified it with his primary doctor. In any case over the last several weeks or so according the patient although these wounds look somewhat more chronic than that he has developed  predominantly large wounds on the right medial and right lateral ankle smaller areas on the left leg and areas on the dorsal aspect of his right second and third toes. Its not clear  how he has been dressing these. 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 The patient has known PAD. He had an angiogram in August 2022 at which time he had nonhealing wounds in both feet. On the left his major vessels in the thigh were all patent. He had three-vessel patent to the level of the ankle. He had a very short occlusion in the left anterior tibial. On the right lower extremity the major vessels in his thigh were all patent. He had three-vessel runoff to the foot sluggish filling of the anterior tibial artery. He was felt to have some component of small vessel disease but nothing that was amenable or needed revascularization. It was recommended that he have amputation of the second and third toes on the right foot if they did not heal He has been following with [name] of podiatry. [name] got him juxta lite stockings although I do not think he had them on properly he has uncontrolled edema in both legs Past medical history includes type 2 diabetes [although the patient really denies this], left TMA in 2022,lower extremity wounds in fact attendance at this clinic in 2009-2010, A-fib on Eliquis, chronic venous insufficiency with secondary lymphedema history of non-Hodgkin's lymphoma. 08-11-2021 upon evaluation today patient presents for follow-up evaluation he was seen last Wednesday initially for inspection here in our clinic. With that being said he tells me that he unfortunately has been having a lot of drainage and is actually coming through his wrap. Fortunately I do not see any evidence of active infection locally or systemically at this time which is great news. No fevers, chills, nausea, vomiting, or diarrhea. With that being said there does appear to be some evidence of local infection based on what I am seeing today. 08-18-2021 upon evaluation today patient appears to be doing okay currently in regard  to his wounds with that being said that he is doing much better but still has a long ways to go to get to where he wanted to be. I think the infection is significantly improved. He has another week of the antibiotic at this point. 08-25-2021 upon evaluation today patient's wounds are actually doing decently well he has erythema has significantly improved. I think the cellulitis is under controlled I am going to continue him on 2 more weeks of the Levaquin at 500 mg this is a lower dose but I am hoping it will be better for him. 09-08-2021 upon evaluation today patient appears to be doing excellent in regard to his wounds. Since I last saw him he was actually in the hospital where he ended up having a pacemaker put in. Subsequently he tells me that he is actually doing quite well although they were unsure whether they were going to do it due to the fact that he had the wounds on his legs. And then I am glad they did anything seem to be doing well. 09-15-2021 upon evaluation patient's wounds are actually showing signs of improvement. The right side wounds do appear to have some need for sharp debridement today and I Georgina Peer go ahead and proceed with that. I think that if we get the wounds cleaned up he will actually show signs of continued improvement.  I am also leaning towards switching to [facility] which I think will be a much better option for him. 09-22-2021 patient's wounds are showing signs of excellent improvement. I am actually extremely pleased with where we stand and I think that the patient is making great progress. There does not appear to be any signs of active infection. Objective Constitutional Obese and well-hydrated in no acute distress. Vitals Time Taken: 3:00 PM, Height: 71 in, Weight: 350 lbs, BMI: 48.8, Temperature: 97.8 F, Pulse: 76 bpm, Respiratory Rate: 18 breaths/min, Blood Pressure: 121/74 mmHg. Respiratory normal breathing without difficulty. Psychiatric this patient is  able to make decisions and demonstrates good insight into disease process. Alert and Oriented x 3. pleasant and cooperative. General Notes: Patient's wounds did not require active sharp debridement today. He actually was able to be cleaned off with just saline and gauze and no sharp debridement. Post this cleaned it off and debridement he actually was significantly improved overall I am extremely pleased with where we stand. Integumentary (Hair, Skin) Wound #12 status is Open. Original cause of wound was Gradually Appeared. The date acquired was: 07/15/2021. The wound has been in treatment 7 weeks. The wound is located on the Left,Anterior Lower Leg. The wound measures 0.7cm length x 1.8cm width x 0.1cm depth; 0.99cm^2 area and 0.099cm^3 volume. There is Fat Layer (Subcutaneous Tissue) exposed. There is no tunneling or undermining noted. There is a large amount of serosanguineous drainage noted. The wound margin is distinct with the outline attached to the wound base. There is large (67-100%) red, pink granulation within the wound bed. There is no necrotic tissue within the wound bed. Wound #7 status is Open. Original cause of wound was Blister. The date acquired was: 07/21/2021. The wound has been in treatment 7 weeks. The wound is located on the Right,Medial Malleolus. The wound measures 6.5cm length x 5.9cm width x 0.9cm depth; 30.12cm^2 area and 27.108cm^3 volume. There is Fat Layer (Subcutaneous Tissue) exposed. There is no tunneling or undermining noted. There is a medium amount of serosanguineous drainage noted. The wound margin is distinct with the outline attached to the wound base. There is medium (34-66%) red granulation within the wound bed. There is a medium (34-66%) amount of necrotic tissue within the wound bed including Adherent Slough. Wound #8 status is Healed - Epithelialized. Original cause of wound was Gradually Appeared. The date acquired was: 07/22/2021. The wound has been  in treatment 7 weeks. The wound is located on the Right,Lateral Malleolus. The wound measures 0cm length x 0cm width x 0cm depth; 0cm^2 area and 0cm^3 volume. There is Fat Layer (Subcutaneous Tissue) exposed. There is no tunneling or undermining noted. There is a medium amount of serosanguineous drainage noted. The wound margin is distinct with the outline attached to the wound base. There is small (1-33%) red, pink granulation within the wound bed. There is a large (67-100%) amount of necrotic tissue within the wound bed. 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 There was a Three Layer Compression Therapy Procedure  by Deon Pilling, RN. Post procedure Diagnosis Wound #12: Same as Pre-Procedure Wound #7 Pre-procedure diagnosis of Wound #7 is a Venous Leg Ulcer located on the Right,Medial Malleolus . There was a Three Layer Compression Therapy Procedure by Deon Pilling, RN. Post procedure Diagnosis Wound #7: Same as Pre-Procedure Pre-procedure diagnosis of Wound #7 is a Venous Leg Ulcer located on the Right,Medial Malleolus . An Chemical Cauterization procedure was performed by [name], PA. Post procedure Diagnosis Wound #7: Same as Pre-Procedure Notes: silver nitrate used. Plan Follow-up Appointments: Return Appointment in 1 week. Margarita Grizzle and Anna, Room 8 [facility] Return Appointment in 2 weeks. Margarita Grizzle and Colby, Room 8  [facility] Other: - Nambe topical compounding antibiotics- bring to your appointments. Anesthetic: (In clinic) Topical Lidocaine 5% applied to wound bed Bathing/ Shower/ Hygiene: May shower with protection but do not get wound dressing(s) wet. Edema Control - Lymphedema / SCD / Other: Elevate legs to the level of the heart or above for 30 minutes daily and/or when sitting, a frequency of: - 3-4 times a day throughout the day. Avoid standing for long periods of time. Exercise regularly WOUND #12: - Lower Leg Wound Laterality: Left, Anterior Peri-Wound Care: Triamcinolone 15 (g) 1 x Per Week/30 Days Discharge Instructions: Use triamcinolone 15 (g) as directed Peri-Wound Care: Zinc Oxide Ointment 30g tube 1 x Per Week/30 Days Discharge Instructions: Apply Zinc Oxide to periwound with each dressing change Peri-Wound Care: Sween Lotion (Moisturizing lotion) 1 x Per Week/30 Days Discharge Instructions: Apply moisturizing lotion as directed Topical: Keystone topical compounding antibiotics 1 x Per Week/30 Days Discharge Instructions: apply directly to wound bed. Prim Dressing: KerraCel Ag Gelling Fiber Dressing, 2x2 in (silver alginate) 1 x Per Week/30 Days ary Discharge Instructions: Apply silver alginate to wound bed as instructed Secondary Dressing: ABD Pad, 8x10 1 x Per Week/30 Days Discharge Instructions: Apply over primary dressing as directed. Secondary Dressing: Optifoam Non-Adhesive Dressing, 4x4 in 1 x Per Week/30 Days Discharge Instructions: Apply TO LEFT ANTERIOR ANKLE TO PROTECT ANKLE. Com pression Wrap: ThreePress (3 layer compression wrap) 1 x Per Week/30 Days Discharge Instructions: Apply three layer compression as directed. WOUND #7: - Malleolus Wound Laterality: Right, Medial Peri-Wound Care: Triamcinolone 15 (g) 1 x Per Week/30 Days Discharge Instructions: Use triamcinolone 15 (g) as directed Peri-Wound Care: Zinc Oxide Ointment 30g tube 1 x Per Week/30  Days Discharge Instructions: Apply Zinc Oxide to periwound with each dressing change Peri-Wound Care: Sween Lotion (Moisturizing lotion) 1 x Per Week/30 Days Discharge Instructions: Apply moisturizing lotion as directed Topical: Keystone topical compounding antibiotics 1 x Per Week/30 Days Discharge Instructions: apply directly to wound bed. Prim Dressing: KerraCel Ag Gelling Fiber Dressing, 4x5 in (silver alginate) 1 x Per Week/30 Days ary Discharge Instructions: Apply silver alginate to wound bed as instructed Secondary Dressing: ABD Pad, 8x10 1 x Per Week/30 Days Discharge Instructions: Apply over primary dressing as directed. Com pression Wrap: ThreePress (3 layer compression wrap) 1 x Per Week/30 Days Discharge Instructions: Apply three layer compression as directed. 1. I am going to suggest that we go ahead and continue with the recommendation for wound care measures as before and the patient is in agreement with plan. This includes the use of the silver alginate dressing which I think is doing quite well. 2. We will get a continue with ABD pad to cover. 3. We will continue with 3 layer compression wraps bilaterally. We will see patient back for reevaluation in 1 week here in the  clinic. If anything worsens or changes patient will contact our office for additional recommendations. Electronic Signature(s) Signed: 09/24/2021 1:53:15 PM By: [name] PA-C Entered By: [name] on 09/24/2021 13:53:15 -------------------------------------------------------------------------------- SuperBill Details Patient Name: Date of Service: [name], [name] 09/22/2021 Medical Record Number: [id number] Patient Account Number: [id number] Date of Birth/Sex: Treating RN: 1956/01/05 (66 y.o. [name], [name] Primary Care Provider: [name] Other Clinician: Referring Provider: Treating Provider/Extender: [name] in Treatment: 7 Diagnosis Coding ICD-10  Codes Code Description I89.0 Lymphedema, not elsewhere classified I87.333 Chronic venous hypertension (idiopathic) with ulcer and inflammation of bilateral lower extremity L97.828 Non-pressure chronic ulcer of other part of left lower leg with other specified severity L97.818 Non-pressure chronic ulcer of other part of right lower leg with other specified severity E11.621 Type 2 diabetes mellitus with foot ulcer L97.518 Non-pressure chronic ulcer of other part of right foot with other specified severity Facility Procedures CPT4: Code [id number] foot Description: 1 BILATERAL: Application of multi-layer venous compression system; leg (below knee), including ankle and . Modifier: 35 Quantity: 1 CPT4: [id number] Description: 0 - CHEM CAUT GRANULATION TISS ICD-10 Diagnosis Description L97.818 Non-pressure chronic ulcer of other part of right lower leg with other specified severity Modifier: Quantity: 1 Physician Procedures : CPT4 Code Description Modifier [id number] - WC PHYS LEVEL 3 - EST PT 25 ICD-10 Diagnosis Description I89.0 Lymphedema, not elsewhere classified I87.333 Chronic venous hypertension (idiopathic) with ulcer and inflammation of bilateral lower extremity  L97.828 Non-pressure chronic ulcer of other part of left lower leg with other specified severity L97.818 Non-pressure chronic ulcer of other part of right lower leg with other specified severity Quantity: 1 : [id number] - WC PHYS CHEM CAUT GRAN TISSUE ICD-10 Diagnosis Description L97.818 Non-pressure chronic ulcer of other part of right lower leg with other specified severity Quantity: 1 Electronic Signature(s) Signed: 09/24/2021 1:54:08 PM By: [name] PA-C Previous Signature: 09/22/2021 5:46:43 PM Version By: [name] PA-C Previous Signature: 09/22/2021 5:59:54 PM Version By: Deon Pilling RN, BSN Entered By: [name] on 09/24/2021 13:54:08

## 2021-09-23 NOTE — Progress Notes (Signed)
[name] ([id number]) , Visit Report for 09/22/2021 Arrival Information Details Patient Name: Date of Service: [name], [name] 09/22/2021 3:00 PM Medical Record Number: [id number] Patient Account Number: [id number] Date of Birth/Sex: Treating RN: [birth year] (66 y.o. M) Primary Care Klare Criss: [name] Other Clinician: Referring Nikkole Placzek: Treating Jalynn Waddell/Extender: [name] in Treatment: 7 Visit Information History Since Last Visit Added or deleted any medications: Yes Patient Arrived: Gilford Rile Any new allergies or adverse reactions: No Arrival Time: 15:00 Had a fall or experienced change in No Transfer Assistance: None activities of daily living that may affect Patient Requires Transmission-Based Precautions: No risk of falls: Patient Has Alerts: Yes Signs or symptoms of abuse/neglect since last visito No Patient Alerts: Patient on Blood Thinner Hospitalized since last visit: No 01/2021 ABI L 1.13 R 1.06 Implantable device outside of the clinic excluding No 01/2021 TBI L amp R 0.8 cellular tissue based products placed in the center since last visit: Has Compression in Place as Prescribed: Yes Pain Present Now: No Electronic Signature(s) Signed: 09/23/2021 4:42:06 PM By: Erenest Blank Entered By: Erenest Blank on 09/22/2021 15:00:47 -------------------------------------------------------------------------------- Compression Therapy Details Patient Name: Date of Service: [name], [name] [name] 09/22/2021 3:00 PM Medical Record Number: [id number] Patient Account Number: [id number] Date of Birth/Sex: Treating RN: [birth year] (66 y.o. [name] Primary Care Jayce Kainz: [name] Other Clinician: Referring Shariece Viveiros: Treating Gunnar Hereford/Extender: [name] in Treatment: 7 Compression Therapy Performed for Wound Assessment: Wound #7 Right,Medial Malleolus Performed By: Clinician Deon Pilling, RN Compression Type: Three  Layer Post Procedure Diagnosis Same as Pre-procedure Electronic Signature(s) Signed: 09/22/2021 5:59:54 PM By: Deon Pilling RN, BSN Entered By: Deon Pilling on 09/22/2021 16:15:52 -------------------------------------------------------------------------------- Compression Therapy Details Patient Name: Date of Service: [name], [name] 09/22/2021 3:00 PM Medical Record Number: [id number] Patient Account Number: [id number] Date of Birth/Sex: Treating RN: 05/28/1955 (66 y.o. [name] Primary Care Sesilia Poucher: [name] Other Clinician: Referring Jermani Eberlein: Treating Oral Remache/Extender: [name] in Treatment: 7 Compression Therapy Performed for Wound Assessment: Wound #12 Left,Anterior Lower Leg Performed By: Clinician Deon Pilling, RN Compression Type: Three Layer Post Procedure Diagnosis Same as Pre-procedure Electronic Signature(s) Signed: 09/22/2021 5:59:54 PM By: Deon Pilling RN, BSN Entered By: Deon Pilling on 09/22/2021 16:15:52 -------------------------------------------------------------------------------- Encounter Discharge Information Details Patient Name: Date of Service: [name], [name] 09/22/2021 3:00 PM Medical Record Number: [id number] Patient Account Number: [id number] Date of Birth/Sex: Treating RN: 01-Aug-1955 (66 y.o. [name] Primary Care Eustacia Urbanek: [name] Other Clinician: Referring Jasneet Schobert: Treating Shailee Foots/Extender: [name] in Treatment: 7 Encounter Discharge Information Items Discharge Condition: Stable Ambulatory Status: Walker Discharge Destination: Home Transportation: Private Auto Accompanied By: friend Schedule Follow-up Appointment: Yes Clinical Summary of Care: Electronic Signature(s) Signed: 09/22/2021 5:59:54 PM By: Deon Pilling RN, BSN Entered By: Deon Pilling on 09/22/2021  16:19:35 -------------------------------------------------------------------------------- Lower Extremity Assessment Details Patient Name: Date of Service: [name], [name] [name] 09/22/2021 3:00 PM Medical Record Number: [id number] Patient Account Number: [id number] Date of Birth/Sex: Treating RN: May 05, 1955 (66 y.o. M) Primary Care Jacobe Study: [name] Other Clinician: Referring Eder Macek: Treating Brunetta Newingham/Extender: [name] in Treatment: 7 Edema Assessment Assessed: [Left: No] [Right: No] Edema: [Left: Yes] [Right: Yes] Calf Left: Right: Point of Measurement: 31 cm From Medial Instep 42.2 cm 40 cm Ankle Left: Right: Point of Measurement: 11 cm From Medial Instep 26.4 cm 29 cm Electronic Signature(s) Signed: 09/23/2021 4:42:06 PM By: Barbarann Ehlers,  Doyle Askew By: Erenest Blank on 09/22/2021 15:27:40 -------------------------------------------------------------------------------- Multi-Disciplinary Care Plan Details Patient Name: Date of Service: [name], [name] 09/22/2021 3:00 PM Medical Record Number: [id number] Patient Account Number: [id number] Date of Birth/Sex: Treating RN: 12/10/1955 (66 y.o. [name] Primary Care Barnett Elzey: [name] Other Clinician: Referring Antrice Pal: Treating Hendrik Donath/Extender: [name] in Treatment: 7 Multidisciplinary Care Plan reviewed with physician Active Inactive Pain, Acute or Chronic Nursing Diagnoses: Pain, acute or chronic: actual or potential Potential alteration in comfort, pain Goals: Patient will verbalize adequate pain control and receive pain control interventions during procedures as needed Date Initiated: 08/04/2021 Target Resolution Date: 10/08/2021 Goal Status: Active Patient/caregiver will verbalize comfort level met Date Initiated: 08/04/2021 Target Resolution Date: 10/08/2021 Goal Status: Active Interventions: Encourage patient to take pain medications as  prescribed Provide education on pain management Reposition patient for comfort Treatment Activities: Administer pain control measures as ordered : 08/04/2021 Notes: Venous Leg Ulcer Nursing Diagnoses: Knowledge deficit related to disease process and management Goals: Non-invasive venous studies are completed as ordered Date Initiated: 08/04/2021 Date Inactivated: 08/25/2021 Target Resolution Date: 08/13/2021 Goal Status: Met Patient/caregiver will verbalize understanding of disease process and disease management Date Initiated: 08/04/2021 Target Resolution Date: 10/08/2021 Goal Status: Active Interventions: Assess peripheral edema status every visit. Compression as ordered Treatment Activities: Non-invasive vascular studies : 08/04/2021 Notes: Wound/Skin Impairment Nursing Diagnoses: Knowledge deficit related to ulceration/compromised skin integrity Goals: Patient/caregiver will verbalize understanding of skin care regimen Date Initiated: 08/04/2021 Target Resolution Date: 10/08/2021 Goal Status: Active Interventions: Assess patient/caregiver ability to perform ulcer/skin care regimen upon admission and as needed Assess ulceration(s) every visit Provide education on ulcer and skin care Treatment Activities: Skin care regimen initiated : 08/04/2021 Topical wound management initiated : 08/04/2021 Notes: Electronic Signature(s) Signed: 09/22/2021 5:59:54 PM By: Deon Pilling RN, BSN Entered By: Deon Pilling on 09/22/2021 15:43:44 -------------------------------------------------------------------------------- Pain Assessment Details Patient Name: Date of Service: [name], [name] 09/22/2021 3:00 PM Medical Record Number: [id number] Patient Account Number: [id number] Date of Birth/Sex: Treating RN: 07-15-1955 (66 y.o. M) Primary Care Jacksen Isip: [name] Other Clinician: Referring Tj Kitchings: Treating Anarely Nicholls/Extender: [name] in Treatment:  7 Active Problems Location of Pain Severity and Description of Pain Patient Has Paino No Site Locations Pain Management and Medication Current Pain Management: Electronic Signature(s) Signed: 09/23/2021 4:42:06 PM By: Erenest Blank Signed: 09/23/2021 4:42:06 PM By: Erenest Blank Entered By: Erenest Blank on 09/22/2021 15:27:22 -------------------------------------------------------------------------------- Patient/Caregiver Education Details Patient Name: Date of Service: [name] Gloss D. 9/13/2023andnbsp3:00 PM Medical Record Number: [id number] Patient Account Number: [id number] Date of Birth/Gender: Treating RN: [birth year] (66 y.o. [name] Primary Care Physician: [name] Other Clinician: Referring Physician: Treating Physician/Extender: [name] in Treatment: 7 Education Assessment Education Provided To: Patient Education Topics Provided Wound/Skin Impairment: Handouts: Skin Care Do's and Dont's Methods: Explain/Verbal Responses: Reinforcements needed Electronic Signature(s) Signed: 09/22/2021 5:59:54 PM By: Deon Pilling RN, BSN Entered By: Deon Pilling on 09/22/2021 15:45:58 -------------------------------------------------------------------------------- Wound Assessment Details Patient Name: Date of Service: [name], [name] 09/22/2021 3:00 PM Medical Record Number: [id number] Patient Account Number: [id number] Date of Birth/Sex: Treating RN: 10/21/1955 (66 y.o. M) Primary Care Siris Hoos: [name] Other Clinician: Referring Arad Burston: Treating Veyda Kaufman/Extender: [name] in Treatment: 7 Wound Status Wound Number: 12 Primary Venous Leg Ulcer Etiology: Wound Location: Left, Anterior Lower Leg Secondary Lymphedema Wounding Event: Gradually Appeared Etiology: Date Acquired: 07/15/2021 Wound Open Weeks Of Treatment:  7 Status: Clustered Wound: No Comorbid Sleep Apnea, Arrhythmia, Congestive  Heart Failure, History: Hypertension, Hypotension, Peripheral Arterial Disease, Peripheral Venous Disease, Type II Diabetes, Gout, Osteoarthritis Wound Measurements Length: (cm) 0.7 Width: (cm) 1.8 Depth: (cm) 0.1 Area: (cm) 0.99 Volume: (cm) 0.099 % Reduction in Area: 92.8% % Reduction in Volume: 92.8% Epithelialization: Medium (34-66%) Tunneling: No Undermining: No Wound Description Classification: Full Thickness Without Exposed Support Structures Wound Margin: Distinct, outline attached Exudate Amount: Large Exudate Type: Serosanguineous Exudate Color: red, brown Foul Odor After Cleansing: No Slough/Fibrino No Wound Bed Granulation Amount: Large (67-100%) Exposed Structure Granulation Quality: Red, Pink Fascia Exposed: No Necrotic Amount: None Present (0%) Fat Layer (Subcutaneous Tissue) Exposed: Yes Tendon Exposed: No Muscle Exposed: No Joint Exposed: No Bone Exposed: No Treatment Notes Wound #12 (Lower Leg) Wound Laterality: Left, Anterior Cleanser Peri-Wound Care Triamcinolone 15 (g) Discharge Instruction: Use triamcinolone 15 (g) as directed Zinc Oxide Ointment 30g tube Discharge Instruction: Apply Zinc Oxide to periwound with each dressing change Sween Lotion (Moisturizing lotion) Discharge Instruction: Apply moisturizing lotion as directed Topical Keystone topical compounding antibiotics Discharge Instruction: apply directly to wound bed. Primary Dressing KerraCel Ag Gelling Fiber Dressing, 2x2 in (silver alginate) Discharge Instruction: Apply silver alginate to wound bed as instructed Secondary Dressing ABD Pad, 8x10 Discharge Instruction: Apply over primary dressing as directed. Optifoam Non-Adhesive Dressing, 4x4 in Discharge Instruction: Apply TO LEFT ANTERIOR ANKLE TO PROTECT ANKLE. Secured With Compression Wrap ThreePress (3 layer compression wrap) Discharge Instruction: Apply three layer compression as directed. Compression  Stockings Add-Ons Electronic Signature(s) Signed: 09/23/2021 4:42:06 PM By: Erenest Blank Entered By: Erenest Blank on 09/22/2021 15:28:14 -------------------------------------------------------------------------------- Wound Assessment Details Patient Name: Date of Service: [name], [name] 09/22/2021 3:00 PM Medical Record Number: [id number] Patient Account Number: [id number] Date of Birth/Sex: Treating RN: [birth year] (66 y.o. M) Primary Care Duane Trias: [name] Other Clinician: Referring Patrisha Hausmann: Treating Ariyan Brisendine/Extender: [name] in Treatment: 7 Wound Status Wound Number: 7 Primary Venous Leg Ulcer Etiology: Wound Location: Right, Medial Malleolus Secondary Lymphedema Wounding Event: Blister Etiology: Date Acquired: 07/21/2021 Wound Open Weeks Of Treatment: 7 Status: Clustered Wound: No Comorbid Sleep Apnea, Arrhythmia, Congestive Heart Failure, History: Hypertension, Hypotension, Peripheral Arterial Disease, Peripheral Venous Disease, Type II Diabetes, Gout, Osteoarthritis Photos Wound Measurements Length: (cm) 6.5 Width: (cm) 5.9 Depth: (cm) 0.9 Area: (cm) 30.12 Volume: (cm) 27.108 % Reduction in Area: 21.3% % Reduction in Volume: -1.1% Epithelialization: Small (1-33%) Tunneling: No Undermining: No Wound Description Classification: Full Thickness With Exposed Support Structures Wound Margin: Distinct, outline attached Exudate Amount: Medium Exudate Type: Serosanguineous Exudate Color: red, brown Foul Odor After Cleansing: No Slough/Fibrino Yes Wound Bed Granulation Amount: Medium (34-66%) Exposed Structure Granulation Quality: Red Fascia Exposed: No Necrotic Amount: Medium (34-66%) Fat Layer (Subcutaneous Tissue) Exposed: Yes Necrotic Quality: Adherent Slough Tendon Exposed: No Muscle Exposed: No Joint Exposed: No Bone Exposed: No Treatment Notes Wound #7 (Malleolus) Wound Laterality: Right,  Medial Cleanser Peri-Wound Care Triamcinolone 15 (g) Discharge Instruction: Use triamcinolone 15 (g) as directed Zinc Oxide Ointment 30g tube Discharge Instruction: Apply Zinc Oxide to periwound with each dressing change Sween Lotion (Moisturizing lotion) Discharge Instruction: Apply moisturizing lotion as directed Topical Keystone topical compounding antibiotics Discharge Instruction: apply directly to wound bed. Primary Dressing KerraCel Ag Gelling Fiber Dressing, 4x5 in (silver alginate) Discharge Instruction: Apply silver alginate to wound bed as instructed Secondary Dressing ABD Pad, 8x10 Discharge Instruction: Apply over primary dressing as directed. Secured With Compression Wrap ThreePress (3 layer compression wrap) Discharge  Instruction: Apply three layer compression as directed. Compression Stockings Add-Ons Electronic Signature(s) Signed: 09/23/2021 4:42:06 PM By: Erenest Blank Entered By: Erenest Blank on 09/22/2021 15:31:55 -------------------------------------------------------------------------------- Wound Assessment Details Patient Name: Date of Service: [name], [name] 09/22/2021 3:00 PM Medical Record Number: [id number] Patient Account Number: [id number] Date of Birth/Sex: Treating RN: [birth year] (66 y.o. [name], [name] Primary Care Mikeal Winstanley: [name] Other Clinician: Referring Gery Sabedra: Treating Iqra Rotundo/Extender: [name] in Treatment: 7 Wound Status Wound Number: 8 Primary Venous Leg Ulcer Etiology: Wound Location: Right, Lateral Malleolus Secondary Lymphedema Wounding Event: Gradually Appeared Etiology: Date Acquired: 07/22/2021 Wound Healed - Epithelialized Weeks Of Treatment: 7 Status: Clustered Wound: Yes Comorbid Sleep Apnea, Arrhythmia, Congestive Heart Failure, History: Hypertension, Hypotension, Peripheral Arterial Disease, Peripheral Venous Disease, Type II Diabetes, Gout,  Osteoarthritis Photos Wound Measurements Length: (cm) Width: (cm) Depth: (cm) Clustered Quantity: Area: (cm) Volume: (cm) 0 % Reduction in Area: 100% 0 % Reduction in Volume: 100% 0 Epithelialization: Medium (34-66%) 1 Tunneling: No 0 Undermining: No 0 Wound Description Classification: Full Thickness Without Exposed Support Structures Wound Margin: Distinct, outline attached Exudate Amount: Medium Exudate Type: Serosanguineous Exudate Color: red, brown Foul Odor After Cleansing: No Slough/Fibrino Yes Wound Bed Granulation Amount: Small (1-33%) Exposed Structure Granulation Quality: Red, Pink Fascia Exposed: No Necrotic Amount: Large (67-100%) Fat Layer (Subcutaneous Tissue) Exposed: Yes Tendon Exposed: No Muscle Exposed: No Joint Exposed: No Bone Exposed: No Treatment Notes Wound #8 (Malleolus) Wound Laterality: Right, Lateral Cleanser Peri-Wound Care Topical Primary Dressing Secondary Dressing Secured With Compression Wrap Compression Stockings Add-Ons Electronic Signature(s) Signed: 09/22/2021 5:59:54 PM By: Deon Pilling RN, BSN Entered By: Deon Pilling on 09/22/2021 16:18:03 -------------------------------------------------------------------------------- Vitals Details Patient Name: Date of Service: [place], [name] 09/22/2021 3:00 PM Medical Record Number: [id number] Patient Account Number: [id number] Date of Birth/Sex: Treating RN: [birth year] (66 y.o. M) Primary Care Fajr Fife: [name] Other Clinician: Referring Tamisha Nordstrom: Treating Melania Kirks/Extender: [name] in Treatment: 7 Vital Signs Time Taken: 15:00 Temperature (F): 97.8 Height (in): 71 Pulse (bpm): 76 Weight (lbs): 350 Respiratory Rate (breaths/min): 18 Body Mass Index (BMI): 48.8 Blood Pressure (mmHg): 121/74 Reference Range: 80 - 120 mg / dl Electronic Signature(s) Signed: 09/23/2021 4:42:06 PM By: Erenest Blank Entered By: Erenest Blank on  09/22/2021 15:03:34

## 2021-09-24 LAB — MULTIPLE MYELOMA PANEL, SERUM
Albumin SerPl Elph-Mcnc: 3.7 g/dL (ref 2.9–4.4)
Albumin/Glob SerPl: 1.2 (ref 0.7–1.7)
Alpha 1: 0.2 g/dL (ref 0.0–0.4)
Alpha2 Glob SerPl Elph-Mcnc: 0.7 g/dL (ref 0.4–1.0)
B-Globulin SerPl Elph-Mcnc: 1.1 g/dL (ref 0.7–1.3)
Gamma Glob SerPl Elph-Mcnc: 1.3 g/dL (ref 0.4–1.8)
Globulin, Total: 3.3 g/dL (ref 2.2–3.9)
IgA: 268 mg/dL (ref 61–437)
IgG (Immunoglobin G), Serum: 1318 mg/dL (ref 603–1613)
IgM (Immunoglobulin M), Srm: 68 mg/dL (ref 20–172)
Total Protein ELP: 7 g/dL (ref 6.0–8.5)

## 2021-09-27 ENCOUNTER — Ambulatory Visit: Payer: Medicare Other | Admitting: Pulmonary Disease

## 2021-09-27 ENCOUNTER — Other Ambulatory Visit (HOSPITAL_COMMUNITY): Payer: Self-pay

## 2021-09-27 ENCOUNTER — Encounter: Payer: Self-pay | Admitting: Pulmonary Disease

## 2021-09-27 VITALS — BP 124/62 | HR 85 | Temp 97.7°F | Ht 71.0 in | Wt 301.2 lb

## 2021-09-27 DIAGNOSIS — G4733 Obstructive sleep apnea (adult) (pediatric): Secondary | ICD-10-CM

## 2021-09-27 DIAGNOSIS — J849 Interstitial pulmonary disease, unspecified: Secondary | ICD-10-CM

## 2021-09-27 LAB — CBC WITH DIFFERENTIAL/PLATELET
Basophils Absolute: 0.1 10*3/uL (ref 0.0–0.1)
Basophils Relative: 1.2 % (ref 0.0–3.0)
Eosinophils Absolute: 0.1 10*3/uL (ref 0.0–0.7)
Eosinophils Relative: 2.1 % (ref 0.0–5.0)
HCT: 45.5 % (ref 39.0–52.0)
Hemoglobin: 14.9 g/dL (ref 13.0–17.0)
Lymphocytes Relative: 18.9 % (ref 12.0–46.0)
Lymphs Abs: 1.3 10*3/uL (ref 0.7–4.0)
MCHC: 32.7 g/dL (ref 30.0–36.0)
MCV: 90.8 fl (ref 78.0–100.0)
Monocytes Absolute: 0.7 10*3/uL (ref 0.1–1.0)
Monocytes Relative: 10.6 % (ref 3.0–12.0)
Neutro Abs: 4.6 10*3/uL (ref 1.4–7.7)
Neutrophils Relative %: 67.2 % (ref 43.0–77.0)
Platelets: 138 10*3/uL — ABNORMAL LOW (ref 150.0–400.0)
RBC: 5.02 Mil/uL (ref 4.22–5.81)
RDW: 18.4 % — ABNORMAL HIGH (ref 11.5–15.5)
WBC: 6.8 10*3/uL (ref 4.0–10.5)

## 2021-09-27 MED ORDER — SPIRONOLACTONE 25 MG PO TABS: 25.0000 mg | ORAL_TABLET | Freq: Every day | ORAL | 11 refills | Status: DC

## 2021-09-27 NOTE — Progress Notes (Unsigned)
[name]    [id number]    [birth year]  Primary Care Physician:[name], Emily Filbert., MD  Referring Physician: Ginger Organ., MD [address] [place],  [place] [address]  Chief complaint: Consult for interstitial lung disease  HPI: 66 y.o. who  has a past medical history of Acute systolic HF (heart failure) ([place]), Arthritis, Atrial fibrillation ([place]), Diabetes mellitus, new onset ([place]), Diverticulosis, Dyslipidemia, Gout attack (01/16/2012), Hypertension, Internal hemorrhoids, Lymphoma ([place]), Pneumonia due to COVID-19 virus (07/2018), Pulmonary hypertension ([place]), Sepsis ([place]) (02/2017), Tubular adenoma of colon, and Venous stasis ulcers ([place]).   History notable for hospitalization in February 2019 with ARDS following influenza.  Patient was not tested for COVID and believes that he may have had COVID infection during early months of the pandemic.  He also had newly diagnosed systolic CHF with EF 84%.  Had a tracheostomy which was eventually decannulated on March 22, 2017.  Other history of OSA, on CPAP.  Needs a new mask as he is lost some weight  Follows with cardiology for chronic systolic biventricular heart failure with a EF 25-30%, bradycardia, BiV ICD, chronic A-fib.  Diagnosed with lymphoma s/p R-[facility] in 2011.  Referred for evaluation of interstitial lung disease noted on screening CT of the chest.  Has chronic dyspnea on exertion which is slightly better now after he got the pacer/ICD and weight loss.  Denies any cough, congestion, sputum production.  Pets: Dogs, cats in the past Occupation: Works as a Customer service manager Exposures: Exposure to some hair spray at work.  No mold, hot tub, Jacuzzi.  He has down pillows that he has been using for many years Smoking history: 10-pack-year smoker.  Quit in 2010 Travel history: No significant travel history Relevant family history: No family history of lung disease  Outpatient Encounter Medications as of  09/27/2021  Medication Sig   acetaminophen (TYLENOL) 325 MG tablet Take 650 mg by mouth every 6 (six) hours as needed for moderate pain.    albuterol (VENTOLIN HFA) 108 (90 Base) MCG/ACT inhaler Inhale 2 puffs into the lungs every 6 (six) hours as needed for wheezing or shortness of breath.   ascorbic acid (VITAMIN C) 500 MG tablet Take 1 tablet (500 mg total) by mouth 3 (three) times daily. (Patient taking differently: Take 500 mg by mouth 2 (two) times daily.)   aspirin 81 MG chewable tablet Chew 1 tablet (81 mg total) by mouth daily.   atorvastatin (LIPITOR) 40 MG tablet TAKE 1 TABLET BY MOUTH EVERY DAY *PT OVERDUE FOR OFFICE VISIT*   cholecalciferol (VITAMIN D) 25 MCG (1000 UNIT) tablet Take 1 tablet (1,000 Units total) by mouth daily.   colchicine 0.6 MG tablet Take 0.6 mg by mouth 2 (two) times daily as needed.   Continuous Blood Gluc Receiver (FREESTYLE LIBRE 2 READER) DEVI 1 each by Does not apply route daily.   Continuous Blood Gluc Sensor (FREESTYLE LIBRE 2 SENSOR) MISC 1 each by Does not apply route daily.   dapagliflozin propanediol (FARXIGA) 10 MG TABS tablet Take 1 tablet (10 mg total) by mouth daily.   ELIQUIS 5 MG TABS tablet TAKE 1 TABLET BY MOUTH TWICE A DAY   Omega 3 340 MG CPDR Take 1 capsule (340 mg total) by mouth 2 (two) times daily. (Patient taking differently: Take 340 mg by mouth daily.)   omeprazole (PRILOSEC) 40 MG capsule Take 1 capsule (40 mg total) by mouth daily.   polycarbophil (FIBERCON) 625  MG tablet Take 1 tablet (625 mg total) by mouth daily.   sacubitril-valsartan (ENTRESTO) 49-51 MG Take 1 tablet by mouth 2 (two) times daily.   SANTYL 250 UNIT/GM ointment APPLY 1 APPLICATION. TOPICALLY DAILY.   sertraline (ZOLOFT) 50 MG tablet Take 1 tablet (50 mg total) by mouth daily.   spironolactone (ALDACTONE) 25 MG tablet Take 1 tablet (25 mg total) by mouth daily.   Tirzepatide (MOUNJARO [place]) Inject 1 Dose into the skin once a week.   torsemide (DEMADEX) 20 MG tablet  Take 1 tablet (20 mg total) by mouth as needed.   vitamin B-12 (CYANOCOBALAMIN) 1000 MCG tablet Take 1 tablet (1,000 mcg total) by mouth daily.   [DISCONTINUED] carvedilol (COREG) 6.25 MG tablet Take 6.25 mg by mouth 2 (two) times daily.   [DISCONTINUED] levofloxacin (LEVAQUIN) 750 MG tablet Take 750 mg by mouth daily.   [DISCONTINUED] sodium chloride (OCEAN) 0.65 % SOLN nasal spray Place 2 sprays into both nostrils as needed for congestion.   No facility-administered encounter medications on file as of 09/27/2021.    Allergies as of 09/27/2021 - Review Complete 09/27/2021  Allergen Reaction Noted   Latex Hives and Swelling 04/12/2017   Ace inhibitors  10/16/2020   Penicillin g  10/16/2020    Past Medical History:  Diagnosis Date   Acute systolic HF (heart failure) (HCC)    Arthritis    lt foot, hips knees    Atrial fibrillation ([place])    Diabetes mellitus, new onset ([place])    Diverticulosis    Dyslipidemia    Gout attack 01/16/2012   Hypertension    Internal hemorrhoids    Lymphoma ([place])    remission for about 2 years, chemo 3 years prior   Pneumonia due to COVID-19 virus 07/2018   Pulmonary hypertension ([place])    Sepsis ([place]) 02/2017   secondary to influenza; requiring trach   Tubular adenoma of colon    Venous stasis ulcers ([place])     Past Surgical History:  Procedure Laterality Date   ABDOMINAL AORTOGRAM W/LOWER EXTREMITY N/A 08/14/2020   Procedure: ABDOMINAL AORTOGRAM W/LOWER EXTREMITY;  Surgeon: Elam Dutch, MD;  Location: [facility];  Service: Cardiovascular;  Laterality: N/A;   AMPUTATION TOE Left 05/08/2019   Procedure: AMPUTATION LEFT GREAT  TOE;  Surgeon: Trula Slade, DPM;  Location: [facility];  Service: Podiatry;  Laterality: Left;   ARTERIAL LINE INSERTION Right 08/26/2021   Procedure: ARTERIAL LINE INSERTION;  Surgeon: Early Osmond, MD;  Location: [facility];  Service: Cardiovascular;  Laterality: Right;  rt radial   BIV ICD INSERTION  CRT-D N/A 09/01/2021   Procedure: BIV ICD INSERTION CRT-D;  Surgeon: Evans Lance, MD;  Location: [facility];  Service: Cardiovascular;  Laterality: N/A;   BRONCHOSCOPY     ESOPHAGOGASTRODUODENOSCOPY ENDOSCOPY     Dr Royden Purl HERNIA REPAIR Left    2001   IR REMOVAL TUN ACCESS W/ PORT W/O FL MOD SED  02/03/2020   PORTA CATH INSERTION  2011   RIGHT/LEFT HEART CATH AND CORONARY ANGIOGRAPHY N/A 08/27/2021   Procedure: RIGHT/LEFT HEART CATH AND CORONARY ANGIOGRAPHY;  Surgeon: Jolaine Artist, MD;  Location: [facility];  Service: Cardiovascular;  Laterality: N/A;   TEMPORARY PACEMAKER N/A 08/26/2021   Procedure: TEMPORARY PACEMAKER;  Surgeon: Early Osmond, MD;  Location: [facility];  Service: Cardiovascular;  Laterality: N/A;   TRACHEOSTOMY  03/2017   with decannulation  TRANSMETATARSAL AMPUTATION Left 08/05/2020   Procedure: TRANSMETATARSAL AMPUTATION;  Surgeon: Trula Slade, DPM;  Location: [facility];  Service: Podiatry;  Laterality: Left;    Family History  Problem Relation Age of Onset   Dementia Brother 75       frontal lobe    Dementia Mother    Arthritis Mother    Hypertension Mother    CVA Sister 62   Colon cancer Neg Hx    Pancreatic cancer Neg Hx    Stomach cancer Neg Hx    Rectal cancer Neg Hx    Liver cancer Neg Hx     Social History   Socioeconomic History   Marital status: Divorced    Spouse name: Not on file   Number of children: Not on file   Years of education: Not on file   Highest education level: Not on file  Occupational History   Occupation: cosmetologist  Tobacco Use   Smoking status: Former    Packs/day: 0.50    Years: 20.00    Total pack years: 10.00    Types: Cigarettes    Quit date: 01/10/2009    Years since quitting: 12.7   Smokeless tobacco: Never  Vaping Use   Vaping Use: Never used  Substance and Sexual Activity   Alcohol use: Not Currently    Comment: 2-3 times per week   Drug use: No   Sexual  activity: Not on file  Other Topics Concern   Not on file  Social History Narrative   Not on file   Social Determinants of Health   Financial Resource Strain: Not on file  Food Insecurity: No Food Insecurity (09/20/2021)   Hunger Vital Sign    Worried About Running Out of Food in the Last Year: Never true    Ran Out of Food in the Last Year: Never true  Transportation Needs: No Transportation Needs (09/20/2021)   PRAPARE - Hydrologist (Medical): No    Lack of Transportation (Non-Medical): No  Physical Activity: Not on file  Stress: Not on file  Social Connections: Not on file  Intimate Partner Violence: Not on file    Review of systems: Review of Systems  Constitutional: Negative for fever and chills.  HENT: Negative.   Eyes: Negative for blurred vision.  Respiratory: as per HPI  Cardiovascular: Negative for chest pain and palpitations.  Gastrointestinal: Negative for vomiting, diarrhea, blood per rectum. Genitourinary: Negative for dysuria, urgency, frequency and hematuria.  Musculoskeletal: Negative for myalgias, back pain and joint pain.  Skin: Negative for itching and rash.  Neurological: Negative for dizziness, tremors, focal weakness, seizures and loss of consciousness.  Endo/Heme/Allergies: Negative for environmental allergies.  Psychiatric/Behavioral: Negative for depression, suicidal ideas and hallucinations.  All other systems reviewed and are negative.  Physical Exam: Blood pressure 124/62, pulse 85, temperature 97.7 F (36.5 C), temperature source Oral, height '5\' 11"'$  (1.803 m), weight (!) 301 lb 3.2 oz (136.6 kg), SpO2 98 %. Gen:      No acute distress, overweight HEENT:  EOMI, sclera anicteric Neck:     No masses; no thyromegaly Lungs:    Clear to auscultation bilaterally; normal respiratory effort CV:         Regular rate and rhythm; no murmurs Abd:      + bowel sounds; soft, non-tender; no palpable masses, no distension Ext:     No edema; adequate peripheral perfusion Skin:      Warm and dry; no rash Neuro:  alert and oriented x 3 Psych: normal mood and affect  Data Reviewed: Imaging: Screening CT chest 03/01/2021-patchy thickening of peribronchovascular interstitium with groundglass attenuation, septal thickening in the mid to upper lungs.  This is new compared to prior.  I have reviewed the images personally.  PFTs: 06/19/2017 FVC 3.90 [82%], FEV1 3.35 [94%], F/F 86, TLC 5.13 [73%], DLCO 16.92 [52%] Minimal restriction, moderate diffusion defect  Labs:   Assessment:  Consult for interstitial lung disease His CT scan which is a low-dose screening CT does show some vague changes of upper lobe predominant groundglass opacification and interstitial thickening.  This is not very evident from prior CT scans.  He does have a hospitalization in 2019 for severe ARDS secondary to influenza, suspected COVID-19 and also ongoing exposure to down pillows for many years but this change appears to be new  We will start off by getting basic CTD serologies, schedule PFTs and lung function test for better evaluation of the lung I have advised him to get rid of the down pillows.  OSA On CPAP per his primary care.  He needs new mask fitting and is going to contact his DME company for this.  Plan/Recommendations: Check labs, CTD serologies High-res CT, PFTs  Marshell Garfinkel MD [facility] 09/27/2021, 2:46 PM  CC: Ginger Organ., MD

## 2021-09-27 NOTE — Patient Instructions (Signed)
We will check some labs today including CBC differential, IgE, connective tissue serologies and hypersensitivity panel We will schedule high-resolution CT and PFTs for better evaluation of the lung Follow-up in clinic in 2 months for review and plan for next steps.

## 2021-09-29 ENCOUNTER — Encounter (HOSPITAL_BASED_OUTPATIENT_CLINIC_OR_DEPARTMENT_OTHER): Payer: Medicare Other | Admitting: Physician Assistant

## 2021-09-29 DIAGNOSIS — L97518 Non-pressure chronic ulcer of other part of right foot with other specified severity: Secondary | ICD-10-CM | POA: Diagnosis not present

## 2021-09-29 DIAGNOSIS — Z6841 Body Mass Index (BMI) 40.0 and over, adult: Secondary | ICD-10-CM | POA: Diagnosis not present

## 2021-09-29 DIAGNOSIS — E1151 Type 2 diabetes mellitus with diabetic peripheral angiopathy without gangrene: Secondary | ICD-10-CM | POA: Diagnosis not present

## 2021-09-29 DIAGNOSIS — E11621 Type 2 diabetes mellitus with foot ulcer: Secondary | ICD-10-CM | POA: Diagnosis not present

## 2021-09-29 DIAGNOSIS — I87333 Chronic venous hypertension (idiopathic) with ulcer and inflammation of bilateral lower extremity: Secondary | ICD-10-CM | POA: Diagnosis not present

## 2021-09-29 DIAGNOSIS — L97818 Non-pressure chronic ulcer of other part of right lower leg with other specified severity: Secondary | ICD-10-CM | POA: Diagnosis not present

## 2021-09-29 DIAGNOSIS — L97822 Non-pressure chronic ulcer of other part of left lower leg with fat layer exposed: Secondary | ICD-10-CM | POA: Diagnosis not present

## 2021-09-29 DIAGNOSIS — I872 Venous insufficiency (chronic) (peripheral): Secondary | ICD-10-CM | POA: Diagnosis not present

## 2021-09-29 DIAGNOSIS — L97312 Non-pressure chronic ulcer of right ankle with fat layer exposed: Secondary | ICD-10-CM | POA: Diagnosis not present

## 2021-09-29 DIAGNOSIS — Z89432 Acquired absence of left foot: Secondary | ICD-10-CM | POA: Diagnosis not present

## 2021-09-29 DIAGNOSIS — L97828 Non-pressure chronic ulcer of other part of left lower leg with other specified severity: Secondary | ICD-10-CM | POA: Diagnosis not present

## 2021-09-29 DIAGNOSIS — I890 Lymphedema, not elsewhere classified: Secondary | ICD-10-CM | POA: Diagnosis not present

## 2021-09-29 LAB — ANA: Anti Nuclear Antibody (ANA): NEGATIVE

## 2021-09-29 LAB — IGE: IgE (Immunoglobulin E), Serum: 2791 kU/L — ABNORMAL HIGH (ref <=114)

## 2021-09-29 LAB — ANTI-DNA ANTIBODY, DOUBLE-STRANDED: ds DNA Ab: <1 IU/mL

## 2021-09-29 LAB — RHEUMATOID FACTOR: Rheumatoid fact SerPl-aCnc: <14 IU/mL (ref <14)

## 2021-09-29 LAB — CYCLIC CITRUL PEPTIDE ANTIBODY, IGG: Cyclic Citrullin Peptide Ab: <16 UNITS

## 2021-09-29 NOTE — Progress Notes (Signed)
EARLEY GROBE ([id number]) , Visit Report for 09/29/2021 Arrival Information Details Patient Name: Date of Service: REGNALD, BOWENS 09/29/2021 3:00 PM Medical Record Number: [id number] Patient Account Number: [id number] Date of Birth/Sex: Treating RN: 11/30/1955 (66 y.o. [name], [name] Primary Care Rowyn Mustapha: [name] Other Clinician: Referring Maveryk Renstrom: Treating Alexiss Iturralde/Extender: [name] in Treatment: 8 Visit Information History Since Last Visit Added or deleted any medications: No Patient Arrived: Gilford Rile Any new allergies or adverse reactions: No Arrival Time: 15:05 Had a fall or experienced change in No Accompanied By: friend activities of daily living that may affect Transfer Assistance: None risk of falls: Patient Identification Verified: Yes Signs or symptoms of abuse/neglect since last visito No Secondary Verification Process Completed: Yes Hospitalized since last visit: No Patient Requires Transmission-Based Precautions: No Implantable device outside of the clinic excluding No Patient Has Alerts: Yes cellular tissue based products placed in the center Patient Alerts: Patient on Blood Thinner since last visit: 01/2021 ABI L 1.13 R 1.06 Has Dressing in Place as Prescribed: Yes 01/2021 TBI L amp R 0.8 Has Compression in Place as Prescribed: Yes Pain Present Now: No Electronic Signature(s) Signed: 09/29/2021 5:32:30 PM By: Deon Pilling RN, BSN Entered By: Deon Pilling on 09/29/2021 15:12:39 -------------------------------------------------------------------------------- Compression Therapy Details Patient Name: Date of Service: Sofia, [name] 09/29/2021 3:00 PM Medical Record Number: [id number] Patient Account Number: [id number] Date of Birth/Sex: Treating RN: [birth year] (66 y.o. [name] Primary Care Murel Shenberger: [name] Other Clinician: Referring Melissia Lahman: Treating Malaya Cagley/Extender: [name] in  Treatment: 8 Compression Therapy Performed for Wound Assessment: Wound #12 Left,Anterior Lower Leg Performed By: Clinician Deon Pilling, RN Compression Type: Three Layer Post Procedure Diagnosis Same as Pre-procedure Electronic Signature(s) Signed: 09/29/2021 5:32:30 PM By: Deon Pilling RN, BSN Entered By: Deon Pilling on 09/29/2021 15:56:15 -------------------------------------------------------------------------------- Compression Therapy Details Patient Name: Date of Service: Bollman, [name] 09/29/2021 3:00 PM Medical Record Number: [id number] Patient Account Number: [id number] Date of Birth/Sex: Treating RN: Jun 10, 1955 (66 y.o. [name] Primary Care Malek Skog: [name] Other Clinician: Referring Hideo Googe: Treating Trenace Coughlin/Extender: [name] in Treatment: 8 Compression Therapy Performed for Wound Assessment: Wound #7 Right,Medial Malleolus Performed By: Clinician Deon Pilling, RN Compression Type: Three Layer Post Procedure Diagnosis Same as Pre-procedure Electronic Signature(s) Signed: 09/29/2021 5:32:30 PM By: Deon Pilling RN, BSN Entered By: Deon Pilling on 09/29/2021 15:56:15 -------------------------------------------------------------------------------- Encounter Discharge Information Details Patient Name: Date of Service: Dunshee, [name] 09/29/2021 3:00 PM Medical Record Number: [id number] Patient Account Number: [id number] Date of Birth/Sex: Treating RN: 07/07/1955 (66 y.o. [name] Primary Care Perez Dirico: [name] Other Clinician: Referring Darnette Lampron: Treating Jerrell Hart/Extender: [name] in Treatment: 8 Encounter Discharge Information Items Post Procedure Vitals Discharge Condition: Stable Temperature (F): 97.9 Ambulatory Status: Walker Pulse (bpm): 71 Discharge Destination: Home Respiratory Rate (breaths/min): 20 Transportation: Private Auto Blood Pressure (mmHg):  114/75 Accompanied By: Denman George Schedule Follow-up Appointment: Yes Clinical Summary of Care: Electronic Signature(s) Signed: 09/29/2021 5:32:30 PM By: Deon Pilling RN, BSN Entered By: Deon Pilling on 09/29/2021 15:58:04 -------------------------------------------------------------------------------- Lower Extremity Assessment Details Patient Name: Date of Service: Wasser, [name] 09/29/2021 3:00 PM Medical Record Number: [id number] Patient Account Number: [id number] Date of Birth/Sex: Treating RN: 11/05/1955 (66 y.o. [name] Primary Care Dvaughn Fickle: [name] Other Clinician: Referring Preston Garabedian: Treating Nikira Kushnir/Extender: [name] in Treatment: 8 Edema Assessment Assessed: [name]: Yes] [Right: Yes] Edema: [  Left: Yes] [Right: Yes] Calf Left: Right: Point of Measurement: 31 cm From Medial Instep 37 cm 41 cm Ankle Left: Right: Point of Measurement: 11 cm From Medial Instep 27 cm 25 cm Knee To Floor Left: Right: From Medial Instep 48 cm 48 cm Vascular Assessment Pulses: Dorsalis Pedis Palpable: [Left:Yes] [Right:Yes] Electronic Signature(s) Signed: 09/29/2021 5:32:30 PM By: Deon Pilling RN, BSN Entered By: Deon Pilling on 09/29/2021 15:49:56 -------------------------------------------------------------------------------- [place] Details Patient Name: Date of Service: Wiggs, [name] 09/29/2021 3:00 PM Medical Record Number: [id number] Patient Account Number: [id number] Date of Birth/Sex: Treating RN: 26-Oct-1955 (66 y.o. [name] Primary Care Shiraz Bastyr: [name] Other Clinician: Referring Kirin Brandenburger: Treating Cleo Santucci/Extender: [name] in Treatment: 8 Multidisciplinary Care Plan reviewed with physician Active Inactive Pain, Acute or Chronic Nursing Diagnoses: Pain, acute or chronic: actual or potential Potential alteration in comfort, pain Goals: Patient will verbalize  adequate pain control and receive pain control interventions during procedures as needed Date Initiated: 08/04/2021 Target Resolution Date: 10/08/2021 Goal Status: Active Patient/caregiver will verbalize comfort level met Date Initiated: 08/04/2021 Target Resolution Date: 10/08/2021 Goal Status: Active Interventions: Encourage patient to take pain medications as prescribed Provide education on pain management Reposition patient for comfort Treatment Activities: Administer pain control measures as ordered : 08/04/2021 Notes: Venous Leg Ulcer Nursing Diagnoses: Knowledge deficit related to disease process and management Goals: Non-invasive venous studies are completed as ordered Date Initiated: 08/04/2021 Date Inactivated: 08/25/2021 Target Resolution Date: 08/13/2021 Goal Status: Met Patient/caregiver will verbalize understanding of disease process and disease management Date Initiated: 08/04/2021 Target Resolution Date: 10/08/2021 Goal Status: Active Interventions: Assess peripheral edema status every visit. Compression as ordered Treatment Activities: Non-invasive vascular studies : 08/04/2021 Notes: Wound/Skin Impairment Nursing Diagnoses: Knowledge deficit related to ulceration/compromised skin integrity Goals: Patient/caregiver will verbalize understanding of skin care regimen Date Initiated: 08/04/2021 Target Resolution Date: 10/08/2021 Goal Status: Active Interventions: Assess patient/caregiver ability to perform ulcer/skin care regimen upon admission and as needed Assess ulceration(s) every visit Provide education on ulcer and skin care Treatment Activities: Skin care regimen initiated : 08/04/2021 Topical wound management initiated : 08/04/2021 Notes: Electronic Signature(s) Signed: 09/29/2021 5:32:30 PM By: Deon Pilling RN, BSN Entered By: Deon Pilling on 09/29/2021 15:23:11 -------------------------------------------------------------------------------- Pain  Assessment Details Patient Name: Date of Service: Dye, [name] 09/29/2021 3:00 PM Medical Record Number: [id number] Patient Account Number: [id number] Date of Birth/Sex: Treating RN: Mar 13, 1955 (66 y.o. [name] Primary Care Gennett Garcia: [name] Other Clinician: Referring Burnett Lieber: Treating Kaiser Belluomini/Extender: [name] in Treatment: 8 Active Problems Location of Pain Severity and Description of Pain Patient Has Paino No Site Locations Rate the pain. Rate the pain. Current Pain Level: 0 Pain Management and Medication Current Pain Management: Medication: No Cold Application: No Rest: No Massage: No Activity: No T.E.N.S.: No Heat Application: No Leg drop or elevation: No Is the Current Pain Management Adequate: Adequate How does your wound impact your activities of daily livingo Sleep: No Bathing: No Appetite: No Relationship With Others: No Bladder Continence: No Emotions: No Bowel Continence: No Work: No Toileting: No Drive: No Dressing: No Hobbies: No Engineer, maintenance) Signed: 09/29/2021 5:32:30 PM By: Deon Pilling RN, BSN Entered By: Deon Pilling on 09/29/2021 15:17:24 -------------------------------------------------------------------------------- Patient/Caregiver Education Details Patient Name: Date of Service: Scheib, [name] 9/20/2023andnbsp3:00 PM Medical Record Number: [id number] Patient Account Number: [id number] Date of Birth/Gender: Treating RN: Jul 18, 1955 (66 y.o. [name] Primary Care Physician: [name] Other Clinician:  Referring Physician: Treating Physician/Extender: [name] in Treatment: 8 Education Assessment Education Provided To: Patient Education Topics Provided Pain: Handouts: A Guide to Pain Control Methods: Explain/Verbal Responses: Reinforcements needed Electronic Signature(s) Signed: 09/29/2021 5:32:30 PM By: Deon Pilling RN, BSN Entered  By: Deon Pilling on 09/29/2021 15:23:46 -------------------------------------------------------------------------------- Wound Assessment Details Patient Name: Date of Service: Westerman, [name] 09/29/2021 3:00 PM Medical Record Number: [id number] Patient Account Number: [id number] Date of Birth/Sex: Treating RN: Dec 22, 1955 (66 y.o. [name], [name] Primary Care Fontaine Kossman: [name] Other Clinician: Referring Mylo Driskill: Treating Kataleia Quaranta/Extender: [name] in Treatment: 8 Wound Status Wound Number: 12 Primary Venous Leg Ulcer Etiology: Wound Location: Left, Anterior Lower Leg Secondary Lymphedema Wounding Event: Gradually Appeared Etiology: Date Acquired: 07/15/2021 Wound Open Weeks Of Treatment: 8 Status: Clustered Wound: No Comorbid Sleep Apnea, Arrhythmia, Congestive Heart Failure, History: Hypertension, Hypotension, Peripheral Arterial Disease, Peripheral Venous Disease, Type II Diabetes, Gout, Osteoarthritis Photos Wound Measurements Length: (cm) 0.5 Width: (cm) 1.5 Depth: (cm) 0.1 Area: (cm) 0.589 Volume: (cm) 0.059 % Reduction in Area: 95.7% % Reduction in Volume: 95.7% Epithelialization: Medium (34-66%) Tunneling: No Undermining: No Wound Description Classification: Full Thickness Without Exposed Support Structures Wound Margin: Distinct, outline attached Exudate Amount: Large Exudate Type: Serosanguineous Exudate Color: red, brown Foul Odor After Cleansing: No Slough/Fibrino No Wound Bed Granulation Amount: Small (1-33%) Exposed Structure Granulation Quality: Red, Pink Fascia Exposed: No Necrotic Amount: Large (67-100%) Fat Layer (Subcutaneous Tissue) Exposed: Yes Necrotic Quality: Adherent Slough Tendon Exposed: No Muscle Exposed: No Joint Exposed: No Bone Exposed: No Treatment Notes Wound #12 (Lower Leg) Wound Laterality: Left, Anterior Cleanser Peri-Wound Care Triamcinolone 15 (g) Discharge Instruction: Use  triamcinolone 15 (g) as directed Sween Lotion (Moisturizing lotion) Discharge Instruction: Apply moisturizing lotion as directed Topical Primary Dressing Promogran Prisma Matrix, 4.34 (sq in) (silver collagen) Discharge Instruction: Moisten collagen with saline or hydrogel Secondary Dressing ABD Pad, 8x10 Discharge Instruction: Apply over primary dressing as directed. Optifoam Non-Adhesive Dressing, 4x4 in Discharge Instruction: Apply TO LEFT ANTERIOR ANKLE TO PROTECT ANKLE. Secured With Compression Wrap ThreePress (3 layer compression wrap) Discharge Instruction: Apply three layer use kerlix instead of cotton compression as directed. Compression Stockings Add-Ons Electronic Signature(s) Signed: 09/29/2021 5:32:30 PM By: Deon Pilling RN, BSN Entered By: Deon Pilling on 09/29/2021 15:24:57 -------------------------------------------------------------------------------- Wound Assessment Details Patient Name: Date of Service: Jesus, [name] 09/29/2021 3:00 PM Medical Record Number: [id number] Patient Account Number: [id number] Date of Birth/Sex: Treating RN: 1955-02-24 (66 y.o. [name], [name] Primary Care Yarithza Mink: [name] Other Clinician: Referring Blair Mesina: Treating Waynesha Rammel/Extender: [name] in Treatment: 8 Wound Status Wound Number: 7 Primary Venous Leg Ulcer Etiology: Wound Location: Right, Medial Malleolus Secondary Lymphedema Wounding Event: Blister Etiology: Date Acquired: 07/21/2021 Wound Open Weeks Of Treatment: 8 Status: Clustered Wound: No Comorbid Sleep Apnea, Arrhythmia, Congestive Heart Failure, History: Hypertension, Hypotension, Peripheral Arterial Disease, Peripheral Venous Disease, Type II Diabetes, Gout, Osteoarthritis Photos Wound Measurements Length: (cm) 6.5 Width: (cm) 6 Depth: (cm) 0.3 Area: (cm) 30.631 Volume: (cm) 9.189 Wound Description Classification: Full Thickness With Exposed Support  Struct Wound Margin: Distinct, outline attached Exudate Amount: Medium Exudate Type: Serosanguineous Exudate Color: red, brown ures Foul Odor After Cleansing: No Slough/Fibrino Yes % Reduction in Area: 20% % Reduction in Volume: 65.7% Epithelialization: Medium (34-66%) Tunneling: No Undermining: No Wound Bed Granulation Amount: Large (67-100%) Exposed Structure Granulation Quality: Red Fascia Exposed: No Necrotic Amount: Small (1-33%) Fat Layer (Subcutaneous Tissue) Exposed: Yes  Necrotic Quality: Adherent Slough Tendon Exposed: No Muscle Exposed: No Joint Exposed: No Bone Exposed: No Treatment Notes Wound #7 (Malleolus) Wound Laterality: Right, Medial Cleanser Peri-Wound Care Triamcinolone 15 (g) Discharge Instruction: Use triamcinolone 15 (g) as directed Sween Lotion (Moisturizing lotion) Discharge Instruction: Apply moisturizing lotion as directed Topical Keystone topical compounding antibiotics Discharge Instruction: **HOLD FOR THIS WEEK**apply directly to wound bed. Primary Dressing Promogran Prisma Matrix, 4.34 (sq in) (silver collagen) Discharge Instruction: Moisten collagen with saline or hydrogel Secondary Dressing ABD Pad, 8x10 Discharge Instruction: Apply over primary dressing as directed. Drawtex 4x4 in Discharge Instruction: Apply over primary dressing as directed. Secured With Compression Wrap ThreePress (3 layer compression wrap) Discharge Instruction: Apply three layer use kerlix instead of cotton compression as directed. Compression Stockings Add-Ons Electronic Signature(s) Signed: 09/29/2021 5:32:30 PM By: Deon Pilling RN, BSN Entered By: Deon Pilling on 09/29/2021 15:25:29 -------------------------------------------------------------------------------- Vitals Details Patient Name: Date of Service: Lish, [name] 09/29/2021 3:00 PM Medical Record Number: [id number] Patient Account Number: [id number] Date of Birth/Sex: Treating RN: 1955/10/07  (66 y.o. [name], [name] Primary Care Linzy Laury: [name] Other Clinician: Referring Masey Scheiber: Treating Dinisha Cai/Extender: [name] in Treatment: 8 Vital Signs Time Taken: 15:18 Temperature (F): 97.9 Height (in): 71 Pulse (bpm): 71 Weight (lbs): 350 Respiratory Rate (breaths/min): 20 Body Mass Index (BMI): 48.8 Blood Pressure (mmHg): 114/75 Reference Range: 80 - 120 mg / dl Electronic Signature(s) Signed: 09/29/2021 5:32:30 PM By: Deon Pilling RN, BSN Entered By: Deon Pilling on 09/29/2021 15:18:45

## 2021-09-29 NOTE — Progress Notes (Addendum)
DEQUANN VANDERVELDEN ([id number]) , Visit Report for 09/29/2021 Chief Complaint Document Details Patient Name: Date of Service: CHRISTROPHER, GINTZ 09/29/2021 3:00 PM Medical Record Number: [id number] Patient Account Number: [id number] Date of Birth/Sex: Treating RN: 12/20/1955 (66 y.o. M) Primary Care Provider: [name] Other Clinician: Referring Provider: Treating Provider/Extender: [name] in Treatment: 8 Information Obtained from: Patient Chief Complaint 08/04/2021; patient returns to clinic with bilateral leg wounds as well as areas on the right foot Electronic Signature(s) Signed: 09/29/2021 3:12:19 PM By: [name] PA-C Entered By: [name] on 09/29/2021 15:12:18 -------------------------------------------------------------------------------- Debridement Details Patient Name: Date of Service: Liguori, [name] 09/29/2021 3:00 PM Medical Record Number: [id number] Patient Account Number: [id number] Date of Birth/Sex: Treating RN: 08/30/1955 (66 y.o. M) [name], [name] Primary Care Provider: [name] Other Clinician: Referring Provider: Treating Provider/Extender: [name] in Treatment: 8 Debridement Performed for Assessment: Wound #7 Right,Medial Malleolus Performed By: Physician [name], PA Debridement Type: Debridement Severity of Tissue Pre Debridement: Fat layer exposed Level of Consciousness (Pre-procedure): Awake and Alert Pre-procedure Verification/Time Out Yes - 15:45 Taken: Start Time: 15:46 Pain Control: Lidocaine 5% topical ointment T Area Debrided (L x W): otal 6 (cm) x 1 (cm) = 6 (cm) Tissue and other material debrided: Viable, Non-Viable, Skin: Dermis , Skin: Epidermis Level: Skin/Epidermis Debridement Description: Selective/Open Wound Instrument: Curette Bleeding: Minimum Hemostasis Achieved: Pressure End Time: 15:54 Procedural Pain: 0 Post Procedural Pain: 0 Response to Treatment:  Procedure was tolerated well Level of Consciousness (Post- Awake and Alert procedure): Post Debridement Measurements of Total Wound Length: (cm) 6.5 Width: (cm) 6 Depth: (cm) 0.3 Volume: (cm) 9.189 Character of Wound/Ulcer Post Debridement: Improved Severity of Tissue Post Debridement: Fat layer exposed Post Procedure Diagnosis Same as Pre-procedure Electronic Signature(s) Signed: 09/29/2021 5:32:30 PM By: Deon Pilling RN, BSN Signed: 09/29/2021 5:51:27 PM By: [name] PA-C Entered By: Deon Pilling on 09/29/2021 15:55:30 -------------------------------------------------------------------------------- HPI Details Patient Name: Date of Service: Panchal, [name] 09/29/2021 3:00 PM Medical Record Number: [id number] Patient Account Number: [id number] Date of Birth/Sex: Treating RN: Sep 05, 1955 (66 y.o. M) M) Primary Care Provider: [name] Other Clinician: Referring Provider: Treating Provider/Extender: [name] in Treatment: 8 History of Present Illness HPI Description: ADMISSION 03/22/2021 This is a 66 year old man with a past medical history significant for diabetes type 2, congestive heart failure, peripheral arterial disease, morbid obesity, venous insufficiency, and coronary artery disease. He has been followed by [name] in podiatry, who performed a transmetatarsal amputation on the left foot in August 2022. He had issues healing that wound, but based upon Dr. [name] notes, ultimately the TMA wound healed. During his recovery from that surgery, however, ulcers opened up over the DIP joint of the right second and third toe. These have apparently closed and reopened multiple times. It sounds like one of the issues has been moisture accumulation and maceration of the tissues causing them to reopen. At his last visit with [name], on March 01, 2021, there continues to be problems with moisture and he was referred to wound care for further  evaluation and management. He had a formal aortogram with runoff performed prior to his TMA. The findings are copied here: Patient has inline flow to both feet with no significant flow-limiting lesion that would be amenable to percutaneous or open revascularization. He does have an element of small vessel disease and has a short segment occlusion of the  distal anterior tibial/dorsalis pedis artery on the left foot but does have posterior tibial artery flow. Would recommend management of wounds with amputation of toes 2 and 3 on the right foot if the wounds do not heal and deteriorate. Transmetatarsal amputation on the left side has as good a blood supply as it is going to get and hopefully this will heal in the future. Formal ABIs were done in January 2023. They are normal bilaterally. ABI Findings: +---------+------------------+-----+----------+--------+ Right Rt Pressure (mmHg)IndexWaveform Comment  +---------+------------------+-----+----------+--------+ Brachial 160     +---------+------------------+-----+----------+--------+ PTA 192 1.06 monophasic  +---------+------------------+-----+----------+--------+ DP 159 0.88 monophasic  +---------+------------------+-----+----------+--------+ Great T oe145 0.80    +---------+------------------+-----+----------+--------+ +--------+------------------+-----+---------+-------+ Left Lt Pressure (mmHg)IndexWaveform Comment +--------+------------------+-----+---------+-------+ [id number]     +--------+------------------+-----+---------+-------+ PTA 204 1.13 triphasic  +--------+------------------+-----+---------+-------+ DP 194 1.07 biphasic   +--------+------------------+-----+---------+-------+ +-------+-----------+-----------+------------+------------+ ABI/TBIT oday's ABIT oday's TBIPrevious ABIPrevious TBI +-------+-----------+-----------+------------+------------+ Right 1.06  0.8 1.26 0.65  +-------+-----------+-----------+------------+------------+ Left 1.13 amputation 1.15 amputation  +-------+-----------+-----------+------------+------------+ Previous ABI on 08/06/20 at [facility] Pedal pressures falsely elevated due to medial calcification. Summary: Right: Resting right ankle-brachial index is within normal range. The right toe-brachial index is normal. Left: Resting left ankle-brachial index is within normal range. READMISSION 08/04/2021 Mr. Eskew is now a 66 year old man who I remember from this clinic many years ago I think he had a right lower extremity predominantly venous wound at the time. He was here for 1 visit in March of this year had wounds on his right second and third toes we apparently dressed them many and they healed so he did not come back. He is listed in [place] is being a diabetic although the patient denies this says he is verified it with his primary doctor. In any case over the last several weeks or so according the patient although these wounds look somewhat more chronic than that he has developed predominantly large wounds on the right medial and right lateral ankle smaller areas on the left leg and areas on the dorsal aspect of his right second and third toes. Its not clear how he has been dressing these. 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 The patient has known PAD. He had an angiogram in August 2022 at which time he had nonhealing wounds in both feet. On the left his major vessels in the thigh were all patent. He had three-vessel patent to the level of the ankle. He had a very short occlusion in the left anterior tibial. On the right lower extremity the major vessels in his thigh were all patent. He had three-vessel runoff to the foot sluggish filling of the anterior tibial artery. He was felt to have some component of small vessel disease  but nothing that was amenable or needed revascularization. It was recommended that he have amputation of the second and third toes on the right foot if they did not heal He has been following with [name] of podiatry. [name] got him juxta lite stockings although I do not think he had them on properly he has uncontrolled edema in both legs Past medical history includes type 2 diabetes [although the patient really denies this], left TMA in 2022,lower extremity wounds in fact attendance at this clinic in 2009-2010, A-fib on Eliquis, chronic venous insufficiency with secondary lymphedema history of non-Hodgkin's lymphoma. 08-11-2021 upon evaluation today patient presents for follow-up evaluation he was seen last Wednesday initially for inspection here in our clinic. With that  being said he tells me that he unfortunately has been having a lot of drainage and is actually coming through his wrap. Fortunately I do not see any evidence of active infection locally or systemically at this time which is great news. No fevers, chills, nausea, vomiting, or diarrhea. With that being said there does appear to be some evidence of local infection based on what I am seeing today. 08-18-2021 upon evaluation today patient appears to be doing okay currently in regard to his wounds with that being said that he is doing much better but still has a long ways to go to get to where he wanted to be. I think the infection is significantly improved. He has another week of the antibiotic at this point. 08-25-2021 upon evaluation today patient's wounds are actually doing decently well he has erythema has significantly improved. I think the cellulitis is under controlled I am going to continue him on 2 more weeks of the Levaquin at 500 mg this is a lower dose but I am hoping it will be better for him. 09-08-2021 upon evaluation today patient appears to be doing excellent in regard to his wounds. Since I last saw him he was actually  in the hospital where he ended up having a pacemaker put in. Subsequently he tells me that he is actually doing quite well although they were unsure whether they were going to do it due to the fact that he had the wounds on his legs. And then I am glad they did anything seem to be doing well. 09-15-2021 upon evaluation patient's wounds are actually showing signs of improvement. The right side wounds do appear to have some need for sharp debridement today and I Georgina Peer go ahead and proceed with that. I think that if we get the wounds cleaned up he will actually show signs of continued improvement. I am also leaning towards switching to [facility] which I think will be a much better option for him. 09-22-2021 patient's wounds are showing signs of excellent improvement. I am actually extremely pleased with where we stand and I think that the patient is making great progress. There does not appear to be any signs of active infection. 09-29-2021 upon evaluation today patient appears to be doing excellent in regard to his wounds. He is actually tolerating the dressing changes without complication. Fortunately I see no evidence of active infection locally or systemically at this time which is great news and overall I am extremely pleased with where we stand currently. Electronic Signature(s) Signed: 09/29/2021 5:20:31 PM By: [name] PA-C Entered By: [name] on 09/29/2021 17:20:31 -------------------------------------------------------------------------------- Physical Exam Details Patient Name: Date of Service: Mcfann, [name] 09/29/2021 3:00 PM Medical Record Number: [id number] Patient Account Number: [id number] Date of Birth/Sex: Treating RN: Dec 22, 1955 (66 y.o. M) Primary Care Provider: [name] Other Clinician: Referring Provider: Treating Provider/Extender: [name] in Treatment: 8 Constitutional Well-nourished and well-hydrated in no acute  distress. Respiratory normal breathing without difficulty. Psychiatric this patient is able to make decisions and demonstrates good insight into disease process. Alert and Oriented x 3. pleasant and cooperative. Notes Patient's wound on the medial aspect of the right ankle region did require sharp debridement today he tolerated that without complication and postdebridement the wound bed appears to be doing significantly better which is great news. Electronic Signature(s) Signed: 09/29/2021 5:20:46 PM By: [name] PA-C Entered By: [name] on 09/29/2021 17:20:45 -------------------------------------------------------------------------------- Physician Orders Details  Patient Name: Date of Service: ROLLINS, WRIGHTSON 09/29/2021 3:00 PM Medical Record Number: [id number] Patient Account Number: [id number] Date of Birth/Sex: Treating RN: [birth year] (66 y.o. [name], [name] Primary Care Provider: [name] Other Clinician: Referring Provider: Treating Provider/Extender: [name] in Treatment: 8 Verbal / Phone Orders: No Diagnosis Coding ICD-10 Coding Code Description I89.0 Lymphedema, not elsewhere classified I87.333 Chronic venous hypertension (idiopathic) with ulcer and inflammation of bilateral lower extremity L97.828 Non-pressure chronic ulcer of other part of left lower leg with other specified severity L97.818 Non-pressure chronic ulcer of other part of right lower leg with other specified severity E11.621 Type 2 diabetes mellitus with foot ulcer L97.518 Non-pressure chronic ulcer of other part of right foot with other specified severity Follow-up Appointments ppointment in 1 week. Margarita Grizzle and [place], Room 8 [facility] Return A ppointment in 2 weeks. Margarita Grizzle and [place], Room 8 [facility] Return A Other: - [place] topical compounding antibiotics- bring to your appointments. Anesthetic (In clinic) Topical Lidocaine 5% applied to wound  bed Bathing/ Shower/ Hygiene May shower with protection but do not get wound dressing(s) wet. Edema Control - Lymphedema / SCD / Other Elevate legs to the level of the heart or above for 30 minutes daily and/or when sitting, a frequency of: - 3-4 times a day throughout the day. Avoid standing for long periods of time. Exercise regularly Wound Treatment Wound #12 - Lower Leg Wound Laterality: Left, Anterior Peri-Wound Care: Triamcinolone 15 (g) 1 x Per Week/30 Days Discharge Instructions: Use triamcinolone 15 (g) as directed Peri-Wound Care: Sween Lotion (Moisturizing lotion) 1 x Per Week/30 Days Discharge Instructions: Apply moisturizing lotion as directed Prim Dressing: Promogran Prisma Matrix, 4.34 (sq in) (silver collagen) 1 x Per Week/30 Days ary Discharge Instructions: Moisten collagen with saline or hydrogel Secondary Dressing: ABD Pad, 8x10 1 x Per Week/30 Days Discharge Instructions: Apply over primary dressing as directed. Secondary Dressing: Optifoam Non-Adhesive Dressing, 4x4 in 1 x Per Week/30 Days Discharge Instructions: Apply TO LEFT ANTERIOR ANKLE TO PROTECT ANKLE. Compression Wrap: ThreePress (3 layer compression wrap) 1 x Per Week/30 Days Discharge Instructions: Apply three layer use kerlix instead of cotton compression as directed. Wound #7 - Malleolus Wound Laterality: Right, Medial Peri-Wound Care: Triamcinolone 15 (g) 1 x Per Week/30 Days Discharge Instructions: Use triamcinolone 15 (g) as directed Peri-Wound Care: Sween Lotion (Moisturizing lotion) 1 x Per Week/30 Days Discharge Instructions: Apply moisturizing lotion as directed Topical: Keystone topical compounding antibiotics 1 x Per Week/30 Days Discharge Instructions: **HOLD FOR THIS WEEK**apply directly to wound bed. Prim Dressing: Promogran Prisma Matrix, 4.34 (sq in) (silver collagen) 1 x Per Week/30 Days ary Discharge Instructions: Moisten collagen with saline or hydrogel Secondary Dressing: ABD Pad,  8x10 1 x Per Week/30 Days Discharge Instructions: Apply over primary dressing as directed. Secondary Dressing: Drawtex 4x4 in 1 x Per Week/30 Days Discharge Instructions: Apply over primary dressing as directed. Compression Wrap: ThreePress (3 layer compression wrap) 1 x Per Week/30 Days Discharge Instructions: Apply three layer use kerlix instead of cotton compression as directed. Electronic Signature(s) Signed: 09/29/2021 5:32:30 PM By: Deon Pilling RN, BSN Signed: 09/29/2021 5:51:27 PM By: [name] PA-C Entered By: Deon Pilling on 09/29/2021 15:57:07 -------------------------------------------------------------------------------- Problem List Details Patient Name: Date of Service: Romney, [name] 09/29/2021 3:00 PM Medical Record Number: [id number] Patient Account Number: [id number] Date of Birth/Sex: Treating RN: 07-04-1955 (66 y.o. M) Primary Care Provider: [name] Other Clinician: Referring Provider: Treating Provider/Extender: Melburn Hake,  Willia Craze, Gwyndolyn Saxon Weeks in Treatment: 8 Active Problems ICD-10 Encounter Code Description Active Date MDM Diagnosis I89.0 Lymphedema, not elsewhere classified 08/04/2021 No Yes I87.333 Chronic venous hypertension (idiopathic) with ulcer and inflammation of 08/04/2021 No Yes bilateral lower extremity L97.828 Non-pressure chronic ulcer of other part of left lower leg with other specified 08/04/2021 No Yes severity L97.818 Non-pressure chronic ulcer of other part of right lower leg with other specified 08/04/2021 No Yes severity E11.621 Type 2 diabetes mellitus with foot ulcer 08/04/2021 No Yes L97.518 Non-pressure chronic ulcer of other part of right foot with other specified 08/04/2021 No Yes severity Inactive Problems Resolved Problems Electronic Signature(s) Signed: 09/29/2021 3:12:10 PM By: [name] PA-C Entered By: [name] on 09/29/2021  15:12:10 -------------------------------------------------------------------------------- Progress Note Details Patient Name: Date of Service: Vanduzer, [name] 09/29/2021 3:00 PM Medical Record Number: [id number] Patient Account Number: [id number] Date of Birth/Sex: Treating RN: 19-Apr-1955 (66 y.o. M) Primary Care Provider: [name] Other Clinician: Referring Provider: Treating Provider/Extender: [name] in Treatment: 8 Subjective Chief Complaint Information obtained from Patient 08/04/2021; patient returns to clinic with bilateral leg wounds as well as areas on the right foot History of Present Illness (HPI) ADMISSION 03/22/2021 This is a 66 year old man with a past medical history significant for diabetes type 2, congestive heart failure, peripheral arterial disease, morbid obesity, venous insufficiency, and coronary artery disease. He has been followed by [name] in podiatry, who performed a transmetatarsal amputation on the left foot in August 2022. He had issues healing that wound, but based upon Dr. [name] notes, ultimately the TMA wound healed. During his recovery from that surgery, however, ulcers opened up over the DIP joint of the right second and third toe. These have apparently closed and reopened multiple times. It sounds like one of the issues has been moisture accumulation and maceration of the tissues causing them to reopen. At his last visit with [name], on March 01, 2021, there continues to be problems with moisture and he was referred to wound care for further evaluation and management. He had a formal aortogram with runoff performed prior to his TMA. The findings are copied here: Patient has inline flow to both feet with no significant flow-limiting lesion that would be amenable to percutaneous or open revascularization. He does have an element of small vessel disease and has a short segment occlusion of the distal anterior  tibial/dorsalis pedis artery on the left foot but does have posterior tibial artery flow. Would recommend management of wounds with amputation of toes 2 and 3 on the right foot if the wounds do not heal and deteriorate. Transmetatarsal amputation on the left side has as good a blood supply as it is going to get and hopefully this will heal in the future. Formal ABIs were done in January 2023. They are normal bilaterally. ABI Findings: +---------+------------------+-----+----------+--------+ Right Rt Pressure (mmHg)IndexWaveform Comment  +---------+------------------+-----+----------+--------+ Brachial 160    +---------+------------------+-----+----------+--------+ PTA 192 1.06 monophasic  +---------+------------------+-----+----------+--------+ DP 159 0.88 monophasic  +---------+------------------+-----+----------+--------+ Great T oe145 0.80    +---------+------------------+-----+----------+--------+ +--------+------------------+-----+---------+-------+ Left Lt Pressure (mmHg)IndexWaveform Comment +--------+------------------+-----+---------+-------+ [id number]    +--------+------------------+-----+---------+-------+ PTA 204 1.13 triphasic  +--------+------------------+-----+---------+-------+ DP 194 1.07 biphasic   +--------+------------------+-----+---------+-------+ +-------+-----------+-----------+------------+------------+ ABI/TBIT oday's ABIT oday's TBIPrevious ABIPrevious TBI +-------+-----------+-----------+------------+------------+ Right 1.06 0.8 1.26 0.65  +-------+-----------+-----------+------------+------------+ Left 1.13 amputation 1.15 amputation  +-------+-----------+-----------+------------+------------+ Previous ABI on 08/06/20 at [facility] Pedal pressures falsely elevated due to medial calcification. Summary: Right: Resting right ankle-brachial index is within normal  range. The right  toe-brachial index is normal. Left: Resting left ankle-brachial index is within normal range. READMISSION 08/04/2021 Mr. Merriott is now a 66 year old man who I remember from this clinic many years ago I think he had a right lower extremity predominantly venous wound at the time. He was here for 1 visit in March of this year had wounds on his right second and third toes we apparently dressed them many and they healed so he did not come back. He is listed in [place] is being a diabetic although the patient denies this says he is verified it with his primary doctor. In any case over the last several weeks or so according the patient although these wounds look somewhat more chronic than that he has developed predominantly large wounds on the right medial and right lateral ankle smaller areas on the left leg and areas on the dorsal aspect of his right second and third toes. Its not clear how he has been dressing these. 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 The patient has known PAD. He had an angiogram in August 2022 at which time he had nonhealing wounds in both feet. On the left his major vessels in the thigh were all patent. He had three-vessel patent to the level of the ankle. He had a very short occlusion in the left anterior tibial. On the right lower extremity the major vessels in his thigh were all patent. He had three-vessel runoff to the foot sluggish filling of the anterior tibial artery. He was felt to have some component of small vessel disease but nothing that was amenable or needed revascularization. It was recommended that he have amputation of the second and third toes on the right foot if they did not heal He has been following with [name] of podiatry. [name] got him juxta lite stockings although I do not think he had them on properly he has uncontrolled edema in both legs Past medical history  includes type 2 diabetes [although the patient really denies this], left TMA in 2022,lower extremity wounds in fact attendance at this clinic in 2009-2010, A-fib on Eliquis, chronic venous insufficiency with secondary lymphedema history of non-Hodgkin's lymphoma. 08-11-2021 upon evaluation today patient presents for follow-up evaluation he was seen last Wednesday initially for inspection here in our clinic. With that being said he tells me that he unfortunately has been having a lot of drainage and is actually coming through his wrap. Fortunately I do not see any evidence of active infection locally or systemically at this time which is great news. No fevers, chills, nausea, vomiting, or diarrhea. With that being said there does appear to be some evidence of local infection based on what I am seeing today. 08-18-2021 upon evaluation today patient appears to be doing okay currently in regard to his wounds with that being said that he is doing much better but still has a long ways to go to get to where he wanted to be. I think the infection is significantly improved. He has another week of the antibiotic at this point. 08-25-2021 upon evaluation today patient's wounds are actually doing decently well he has erythema has significantly improved. I think the cellulitis is under controlled I am going to continue him on 2 more weeks of the Levaquin at 500 mg this is a lower dose but I am hoping it will be better for him. 09-08-2021 upon evaluation  today patient appears to be doing excellent in regard to his wounds. Since I last saw him he was actually in the hospital where he ended up having a pacemaker put in. Subsequently he tells me that he is actually doing quite well although they were unsure whether they were going to do it due to the fact that he had the wounds on his legs. And then I am glad they did anything seem to be doing well. 09-15-2021 upon evaluation patient's wounds are actually showing signs of  improvement. The right side wounds do appear to have some need for sharp debridement today and I Georgina Peer go ahead and proceed with that. I think that if we get the wounds cleaned up he will actually show signs of continued improvement. I am also leaning towards switching to [facility] which I think will be a much better option for him. 09-22-2021 patient's wounds are showing signs of excellent improvement. I am actually extremely pleased with where we stand and I think that the patient is making great progress. There does not appear to be any signs of active infection. 09-29-2021 upon evaluation today patient appears to be doing excellent in regard to his wounds. He is actually tolerating the dressing changes without complication. Fortunately I see no evidence of active infection locally or systemically at this time which is great news and overall I am extremely pleased with where we stand currently. Objective Constitutional Well-nourished and well-hydrated in no acute distress. Vitals Time Taken: 3:18 PM, Height: 71 in, Weight: 350 lbs, BMI: 48.8, Temperature: 97.9 F, Pulse: 71 bpm, Respiratory Rate: 20 breaths/min, Blood Pressure: 114/75 mmHg. Respiratory normal breathing without difficulty. Psychiatric this patient is able to make decisions and demonstrates good insight into disease process. Alert and Oriented x 3. pleasant and cooperative. General Notes: Patient's wound on the medial aspect of the right ankle region did require sharp debridement today he tolerated that without complication and postdebridement the wound bed appears to be doing significantly better which is great news. Integumentary (Hair, Skin) Wound #12 status is Open. Original cause of wound was Gradually Appeared. The date acquired was: 07/15/2021. The wound has been in treatment 8 weeks. The wound is located on the Left,Anterior Lower Leg. The wound measures 0.5cm length x 1.5cm width x 0.1cm depth; 0.589cm^2 area and  0.059cm^3 volume. There is Fat Layer (Subcutaneous Tissue) exposed. There is no tunneling or undermining noted. There is a large amount of serosanguineous drainage noted. The wound margin is distinct with the outline attached to the wound base. There is small (1-33%) red, pink granulation within the wound bed. There is a large (67- 100%) amount of necrotic tissue within the wound bed including Adherent Slough. Wound #7 status is Open. Original cause of wound was Blister. The date acquired was: 07/21/2021. The wound has been in treatment 8 weeks. The wound is located on the Right,Medial Malleolus. The wound measures 6.5cm length x 6cm width x 0.3cm depth; 30.631cm^2 area and 9.189cm^3 volume. There is Fat Layer (Subcutaneous Tissue) exposed. There is no tunneling or undermining noted. There is a medium amount of serosanguineous drainage noted. The wound margin is distinct with the outline attached to the wound base. There is large (67-100%) red granulation within the wound bed. There is a small (1-33%) amount of necrotic tissue within the wound bed including Adherent Slough. 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 There was a Selective/Open Wound Skin/Epidermis Debridement with a total area of 6 sq cm performed by [name], PA. With the following instrument(s): Curette to remove Viable and  Non-Viable tissue/material. Material removed includes Skin: Dermis and Skin: Epidermis and after achieving pain control using Lidocaine 5% topical ointment. A time out was conducted at 15:45, prior to the start of the procedure. A Minimum amount of bleeding was controlled with Pressure. The procedure was tolerated well with a pain level of 0 throughout and a pain level of 0 following the procedure. Post Debridement Measurements: 6.5cm length x 6cm width x 0.3cm depth; 9.189cm^3 volume. Character of Wound/Ulcer Post Debridement is improved. Severity of Tissue Post Debridement is: Fat layer exposed. Post procedure Diagnosis Wound #7: Same as Pre-Procedure Pre-procedure diagnosis of Wound #7 is a Venous Leg Ulcer located on the Right,Medial Malleolus . There was a Three Layer Compression Therapy Procedure by Deon Pilling, RN. Post procedure Diagnosis Wound #7: Same as Pre-Procedure Wound #12 Pre-procedure diagnosis of Wound #12 is a Venous Leg Ulcer located on the Left,Anterior Lower Leg . There was a Three Layer Compression Therapy Procedure by Deon Pilling, RN. Post procedure Diagnosis Wound #12: Same as Pre-Procedure Plan Follow-up Appointments: Return Appointment in 1 week. Margarita Grizzle and [place], Room 8 [facility] Return Appointment in 2 weeks. Margarita Grizzle and [place], Room 8 [facility] Other: - [place] topical compounding antibiotics- bring to your appointments. Anesthetic: (In clinic) Topical Lidocaine 5% applied to wound bed Bathing/ Shower/ Hygiene: May shower with protection but do not get wound dressing(s) wet. Edema Control - Lymphedema / SCD / Other: Elevate legs to the level of the heart or above for 30 minutes daily and/or when sitting, a frequency of: - 3-4 times a day throughout the day. Avoid standing for long periods of time. Exercise regularly WOUND #12: - Lower Leg Wound Laterality: Left, Anterior Peri-Wound Care: Triamcinolone 15 (g) 1 x Per Week/30 Days Discharge  Instructions: Use triamcinolone 15 (g) as directed Peri-Wound Care: Sween Lotion (Moisturizing lotion) 1 x Per Week/30 Days Discharge Instructions: Apply moisturizing lotion as directed Prim Dressing: Promogran Prisma Matrix, 4.34 (sq in) (silver collagen) 1 x Per Week/30 Days ary Discharge Instructions: Moisten collagen with saline or hydrogel Secondary Dressing: ABD Pad, 8x10 1 x Per Week/30 Days Discharge Instructions: Apply over primary dressing as directed. Secondary Dressing: Optifoam Non-Adhesive Dressing, 4x4 in 1 x Per Week/30 Days Discharge Instructions: Apply TO LEFT ANTERIOR ANKLE TO PROTECT ANKLE. Com pression Wrap: ThreePress (3 layer compression wrap) 1 x Per Week/30 Days Discharge Instructions: Apply three layer use kerlix instead of cotton compression as directed. WOUND #7: - Malleolus Wound Laterality: Right, Medial Peri-Wound Care: Triamcinolone 15 (g) 1 x Per Week/30 Days Discharge Instructions: Use triamcinolone 15 (g) as directed Peri-Wound Care: Sween Lotion (Moisturizing lotion) 1 x Per Week/30 Days Discharge Instructions: Apply moisturizing lotion as directed Topical: [place] topical compounding antibiotics 1 x Per Week/30 Days Discharge Instructions: **HOLD FOR THIS WEEK**apply directly to wound bed. Prim Dressing: Promogran Prisma Matrix, 4.34 (sq in) (silver collagen) 1 x Per Week/30 Days ary Discharge  Instructions: Moisten collagen with saline or hydrogel Secondary Dressing: ABD Pad, 8x10 1 x Per Week/30 Days Discharge Instructions: Apply over primary dressing as directed. Secondary Dressing: Drawtex 4x4 in 1 x Per Week/30 Days Discharge Instructions: Apply over primary dressing as directed. Com pression Wrap: ThreePress (3 layer compression wrap) 1 x Per Week/30 Days Discharge Instructions: Apply three layer use kerlix instead of cotton compression as directed. 1. I am good recommend that we have the patient go ahead and continue to monitor for any signs of  worsening or infection. With that being said I do believe that he is really doing quite well and I think that may be switching over to silver collagen followed by drawtex could be a good option. This will likely stick less than the alginate is currently. 2. I am also can recommend that we have the patient continue with the 3 layer compression wrap which I think is doing quite well. We will see patient back for reevaluation in 1 week here in the clinic. If anything worsens or changes patient will contact our office for additional recommendations. Electronic Signature(s) Signed: 09/29/2021 5:21:38 PM By: [name] PA-C Entered By: [name] on 09/29/2021 17:21:37 -------------------------------------------------------------------------------- SuperBill Details Patient Name: Date of Service: Laufer, [name] 09/29/2021 Medical Record Number: [id number] Patient Account Number: [id number] Date of Birth/Sex: Treating RN: [birth year] (66 y.o. [name], [name] Primary Care Provider: [name] Other Clinician: Referring Provider: Treating Provider/Extender: [name] in Treatment: 8 Diagnosis Coding ICD-10 Codes Code Description I89.0 Lymphedema, not elsewhere classified I87.333 Chronic venous hypertension (idiopathic) with ulcer and inflammation of bilateral lower extremity L97.828 Non-pressure chronic ulcer of other part of left lower leg with other specified severity L97.818 Non-pressure chronic ulcer of other part of right lower leg with other specified severity E11.621 Type 2 diabetes mellitus with foot ulcer L97.518 Non-pressure chronic ulcer of other part of right foot with other specified severity Facility Procedures CPT4 Code: [id number] Description: [phone] - [facility]BR[facility] WOUND 1ST 20 SQ CM OR < ICD-10 Diagnosis Description L97.818 Non-pressure chronic ulcer of other part of right lower leg with other specified se Modifier: verity Quantity: 1 Physician  Procedures : CPT4 Code Description Modifier [id number] - WC PHYS LEVEL 3 - EST PT 25 ICD-10 Diagnosis Description I89.0 Lymphedema, not elsewhere classified I87.333 Chronic venous hypertension (idiopathic) with ulcer and inflammation of bilateral lower extremity  L97.828 Non-pressure chronic ulcer of other part of left lower leg with other specified severity L97.818 Non-pressure chronic ulcer of other part of right lower leg with other specified severity [id number] - WC PHYS DEBR WO ANESTH 20 SQ CM 1 ICD-10  Diagnosis Description L97.818 Non-pressure chronic ulcer of other part of right lower leg with other specified severity Quantity: 1 Electronic Signature(s) Signed: 09/29/2021 5:24:36 PM By: [name] PA-C Entered By: [name] on 09/29/2021 17:24:36

## 2021-09-30 ENCOUNTER — Telehealth (HOSPITAL_COMMUNITY): Payer: Self-pay | Admitting: *Deleted

## 2021-09-30 NOTE — Telephone Encounter (Signed)
PYP auth

## 2021-10-01 LAB — HYPERSENSITIVITY PNEUMONITIS
A. Pullulans Abs: NEGATIVE
A.Fumigatus #1 Abs: NEGATIVE
Micropolyspora faeni, IgG: NEGATIVE
Pigeon Serum Abs: NEGATIVE
Thermoact. Saccharii: NEGATIVE
Thermoactinomyces vulgaris, IgG: NEGATIVE

## 2021-10-04 ENCOUNTER — Encounter (HOSPITAL_COMMUNITY): Payer: Self-pay | Admitting: *Deleted

## 2021-10-04 ENCOUNTER — Other Ambulatory Visit (HOSPITAL_COMMUNITY): Payer: Self-pay | Admitting: *Deleted

## 2021-10-04 DIAGNOSIS — C8210 Follicular lymphoma grade II, unspecified site: Secondary | ICD-10-CM

## 2021-10-04 MED ORDER — ASPIRIN 81 MG PO CHEW: 81.0000 mg | CHEWABLE_TABLET | Freq: Every day | ORAL | 11 refills | Status: DC

## 2021-10-04 MED ORDER — ATORVASTATIN CALCIUM 40 MG PO TABS: ORAL_TABLET | ORAL | 11 refills | Status: AC

## 2021-10-04 MED ORDER — DAPAGLIFLOZIN PROPANEDIOL 10 MG PO TABS: 10.0000 mg | ORAL_TABLET | Freq: Every day | ORAL | 11 refills | Status: DC

## 2021-10-04 NOTE — Progress Notes (Signed)
Pt's Heart & Stroke Claim Form completed, signed by Dr Haroldine Laws, and faxed in to [phone], pt aware this has been done, copy mailed to him for his records

## 2021-10-06 ENCOUNTER — Encounter (HOSPITAL_BASED_OUTPATIENT_CLINIC_OR_DEPARTMENT_OTHER): Payer: Medicare Other | Admitting: Physician Assistant

## 2021-10-06 DIAGNOSIS — E1151 Type 2 diabetes mellitus with diabetic peripheral angiopathy without gangrene: Secondary | ICD-10-CM | POA: Diagnosis not present

## 2021-10-06 DIAGNOSIS — Z89432 Acquired absence of left foot: Secondary | ICD-10-CM | POA: Diagnosis not present

## 2021-10-06 DIAGNOSIS — L97828 Non-pressure chronic ulcer of other part of left lower leg with other specified severity: Secondary | ICD-10-CM | POA: Diagnosis not present

## 2021-10-06 DIAGNOSIS — I87333 Chronic venous hypertension (idiopathic) with ulcer and inflammation of bilateral lower extremity: Secondary | ICD-10-CM | POA: Diagnosis not present

## 2021-10-06 DIAGNOSIS — E11621 Type 2 diabetes mellitus with foot ulcer: Secondary | ICD-10-CM | POA: Diagnosis not present

## 2021-10-06 DIAGNOSIS — L97518 Non-pressure chronic ulcer of other part of right foot with other specified severity: Secondary | ICD-10-CM | POA: Diagnosis not present

## 2021-10-06 DIAGNOSIS — L97822 Non-pressure chronic ulcer of other part of left lower leg with fat layer exposed: Secondary | ICD-10-CM | POA: Diagnosis not present

## 2021-10-06 DIAGNOSIS — L97312 Non-pressure chronic ulcer of right ankle with fat layer exposed: Secondary | ICD-10-CM | POA: Diagnosis not present

## 2021-10-06 DIAGNOSIS — Z6841 Body Mass Index (BMI) 40.0 and over, adult: Secondary | ICD-10-CM | POA: Diagnosis not present

## 2021-10-06 DIAGNOSIS — L97818 Non-pressure chronic ulcer of other part of right lower leg with other specified severity: Secondary | ICD-10-CM | POA: Diagnosis not present

## 2021-10-06 DIAGNOSIS — I890 Lymphedema, not elsewhere classified: Secondary | ICD-10-CM | POA: Diagnosis not present

## 2021-10-06 NOTE — Progress Notes (Addendum)
[name] [name] ([id number]) , Visit Report for 10/06/2021 Chief Complaint Document Details Patient Name: Date of Service: [name] [name] 10/06/2021 3:00 PM Medical Record Number: [id number] Patient Account Number: [id number] Date of Birth/Sex: Treating RN: 09-Aug-1955 (66 y.o. [name] [name] Primary Care Provider: Marton [name] Other Clinician: Referring Provider: Treating Provider/Extender: [name] [name] in Treatment: 9 Information Obtained from: Patient Chief Complaint 08/04/2021; patient returns to clinic with bilateral leg wounds as well as areas on the right foot Electronic Signature(s) Signed: 10/06/2021 3:08:37 PM By: [name] PA-C Entered By: Worthy [name] on 10/06/2021 15:08:36 -------------------------------------------------------------------------------- HPI Details Patient Name: Date of Service: [name] [name] [name]. 10/06/2021 3:00 PM Medical Record Number: [id number] Patient Account Number: [id number] Date of Birth/Sex: Treating RN: 03/05/1955 (66 y.o. [name] [name] Primary Care Provider: Marton [name] Other Clinician: Referring Provider: Treating Provider/Extender: [name] [name] in Treatment: 9 History of Present Illness HPI Description: ADMISSION 03/22/2021 This is a 66 year old man with a past medical history significant for diabetes type 2, congestive heart failure, peripheral arterial disease, morbid obesity, venous insufficiency, and coronary artery disease. He has been followed by Dr. Earleen [name] in podiatry, who performed a transmetatarsal amputation on the left foot in August 2022. He had issues healing that wound, but based upon Dr. Pasty [name] notes, ultimately the TMA wound healed. During his recovery from that surgery, however, ulcers opened up over the DIP joint of the right second and third toe. These have apparently closed and reopened multiple times. It sounds like one of the issues has been moisture accumulation  and maceration of the tissues causing them to reopen. At his last visit with Dr. Earleen [name], on March 01, 2021, there continues to be problems with moisture and he was referred to wound care for further evaluation and management. He had a formal aortogram with runoff performed prior to his TMA. The findings are copied here: Patient has inline flow to both feet with no significant flow-limiting lesion that would be amenable to percutaneous or open revascularization. He does have an element of small vessel disease and has a short segment occlusion of the distal anterior tibial/dorsalis pedis artery on the left foot but does have posterior tibial artery flow. Would recommend management of wounds with amputation of toes 2 and 3 on the right foot if the wounds do not heal and deteriorate. Transmetatarsal amputation on the left side has as good a blood supply as it is going to get and hopefully this will heal in the future. Formal ABIs were done in January 2023. They are normal bilaterally. ABI Findings: +---------+------------------+-----+----------+--------+ Right Rt Pressure (mmHg)IndexWaveform Comment  +---------+------------------+-----+----------+--------+ Brachial 160     +---------+------------------+-----+----------+--------+ PTA 192 1.06 monophasic  +---------+------------------+-----+----------+--------+ DP 159 0.88 monophasic  +---------+------------------+-----+----------+--------+ Great T oe145 0.80    +---------+------------------+-----+----------+--------+ +--------+------------------+-----+---------+-------+ Left Lt Pressure (mmHg)IndexWaveform Comment +--------+------------------+-----+---------+-------+ [id number]     +--------+------------------+-----+---------+-------+ PTA 204 1.13 triphasic  +--------+------------------+-----+---------+-------+ DP 194 1.07 biphasic    +--------+------------------+-----+---------+-------+ +-------+-----------+-----------+------------+------------+ ABI/TBIT oday's ABIT oday's TBIPrevious ABIPrevious TBI +-------+-----------+-----------+------------+------------+ Right 1.06 0.8 1.26 0.65  +-------+-----------+-----------+------------+------------+ Left 1.13 amputation 1.15 amputation  +-------+-----------+-----------+------------+------------+ Previous ABI on 08/06/20 at [facility] Pedal pressures falsely elevated due to medial calcification. Summary: Right: Resting right ankle-brachial index is within normal range. The right toe-brachial index is normal. Left: Resting left ankle-brachial index is within normal range. READMISSION 08/04/2021 [name] [name] is now a 66 year old man who I remember from this clinic many years ago I think he had a right lower extremity  predominantly venous wound at the time. He was here for 1 visit in March of this year had wounds on his right second and third toes we apparently dressed them many and they healed so he did not come back. He is listed in [place] is being a diabetic although the patient denies this says he is verified it with his primary doctor. In any case over the last several weeks or so according the patient although these wounds look somewhat more chronic than that he has developed predominantly large wounds on the right medial and right lateral ankle smaller areas on the left leg and areas on the dorsal aspect of his right second and third toes. Its not clear how he has been dressing these. 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 The patient has known PAD. He had an angiogram in August 2022 at which time he had nonhealing wounds in both feet. On the left his major vessels in the thigh were all patent. He had three-vessel patent to the level of the ankle. He had a very short  occlusion in the left anterior tibial. On the right lower extremity the major vessels in his thigh were all patent. He had three-vessel runoff to the foot sluggish filling of the anterior tibial artery. He was felt to have some component of small vessel disease but nothing that was amenable or needed revascularization. It was recommended that he have amputation of the second and third toes on the right foot if they did not heal He has been following with Dr. Earleen [name] of podiatry. Dr. Earleen [name] got him juxta lite stockings although I do not think he had them on properly he has uncontrolled edema in both legs Past medical history includes type 2 diabetes [although the patient really denies this], left TMA in 2022,lower extremity wounds in fact attendance at this clinic in 2009-2010, A-fib on Eliquis, chronic venous insufficiency with secondary lymphedema history of non-Hodgkin's lymphoma. 08-11-2021 upon evaluation today patient presents for follow-up evaluation he was seen last Wednesday initially for inspection here in our clinic. With that being said he tells me that he unfortunately has been having a lot of drainage and is actually coming through his wrap. Fortunately I do not see any evidence of active infection locally or systemically at this time which is great news. No fevers, chills, nausea, vomiting, or diarrhea. With that being said there does appear to be some evidence of local infection based on what I am seeing today. 08-18-2021 upon evaluation today patient appears to be doing okay currently in regard to his wounds with that being said that he is doing much better but still has a long ways to go to get to where he wanted to be. I think the infection is significantly improved. He has another week of the antibiotic at this point. 08-25-2021 upon evaluation today patient's wounds are actually doing decently well he has erythema has significantly improved. I think the cellulitis is under controlled I  am going to continue him on 2 more weeks of the Levaquin at 500 mg this is a lower dose but I am hoping it will be better for him. 09-08-2021 upon evaluation today patient appears to be doing excellent in regard to his wounds. Since I last saw him he was actually in the hospital where he ended up having a pacemaker put in. Subsequently he tells me that he is actually doing quite  well although they were unsure whether they were going to do it due to the fact that he had the wounds on his legs. And then I am glad they did anything seem to be doing well. 09-15-2021 upon evaluation patient's wounds are actually showing signs of improvement. The right side wounds do appear to have some need for sharp debridement today and I Georgina Peer go ahead and proceed with that. I think that if we get the wounds cleaned up he will actually show signs of continued improvement. I am also leaning towards switching to [facility] which I think will be a much better option for him. 09-22-2021 patient's wounds are showing signs of excellent improvement. I am actually extremely pleased with where we stand and I think that the patient is making great progress. There does not appear to be any signs of active infection. 09-29-2021 upon evaluation today patient appears to be doing excellent in regard to his wounds. He is actually tolerating the dressing changes without complication. Fortunately I see no evidence of active infection locally or systemically at this time which is great news and overall I am extremely pleased with where we stand currently. 09-2721 upon evaluation today patient actually appears to be doing excellent in regard to his wounds. The left leg is almost completely healed the right ankle is significantly smaller. Overall I am extremely pleased with where we stand at this point. I do not see any evidence of active infection at this time. Electronic Signature(s) Signed: 10/06/2021 5:23:08 PM By: [name]  PA-C Entered By: Worthy [name] on 10/06/2021 17:23:08 -------------------------------------------------------------------------------- Physical Exam Details Patient Name: Date of Service: [place], [name] [name]. 10/06/2021 3:00 PM Medical Record Number: [id number] Patient Account Number: [id number] Date of Birth/Sex: Treating RN: Apr 26, 1955 (66 y.o. [name] [name] Primary Care Provider: Marton [name] Other Clinician: Referring Provider: Treating Provider/Extender: [name] [name] in Treatment: 9 Constitutional Well-nourished and well-hydrated in no acute distress. Respiratory normal breathing without difficulty. Psychiatric this patient is able to make decisions and demonstrates good insight into disease process. Alert and Oriented x 3. pleasant and cooperative. Notes Upon inspection patient's wound bed actually showed signs of good granulation and epithelization at this point. Fortunately I do not see any evidence of active systemic infection which is great news and I think he is actually making good progress working to go ahead and continue with the [facility] topical antibiotics at this point. Electronic Signature(s) Signed: 10/06/2021 5:23:33 PM By: [name] PA-C Entered By: Worthy [name] on 10/06/2021 17:23:33 -------------------------------------------------------------------------------- Physician Orders Details Patient Name: Date of Service: [name] [name] [name]. 10/06/2021 3:00 PM Medical Record Number: [id number] Patient Account Number: [id number] Date of Birth/Sex: Treating RN: [birth year] (66 y.o. [name], [name] Primary Care Provider: Marton [name] Other Clinician: Referring Provider: Treating Provider/Extender: [name] [name] in Treatment: 9 Verbal / Phone Orders: No Diagnosis Coding ICD-10 Coding Code Description I89.0 Lymphedema, not elsewhere classified I87.333 Chronic venous hypertension (idiopathic) with ulcer and  inflammation of bilateral lower extremity L97.828 Non-pressure chronic ulcer of other part of left lower leg with other specified severity L97.818 Non-pressure chronic ulcer of other part of right lower leg with other specified severity E11.621 Type 2 diabetes mellitus with foot ulcer L97.518 Non-pressure chronic ulcer of other part of right foot with other specified severity Follow-up Appointments ppointment in 1 week. Margarita Grizzle and [place], Room 8 [facility] Return A ppointment in 2 weeks. Margarita Grizzle and [place], Room  [place] Return A Other: - Page topical compounding antibiotics- bring to your appointments. bring in compression stockings to weekly appts. Anesthetic (In clinic) Topical Lidocaine 5% applied to wound bed Bathing/ Shower/ Hygiene May shower with protection but do not get wound dressing(s) wet. Edema Control - Lymphedema / SCD / Other Elevate legs to the level of the heart or above for 30 minutes daily and/or when sitting, a frequency of: - 3-4 times a day throughout the day. Avoid standing for long periods of time. Exercise regularly Wound Treatment Wound #12 - Lower Leg Wound Laterality: Left, Anterior Peri-Wound Care: Triamcinolone 15 (g) 1 x Per Week/30 Days Discharge Instructions: Use triamcinolone 15 (g) as directed Peri-Wound Care: Sween Lotion (Moisturizing lotion) 1 x Per Week/30 Days Discharge Instructions: Apply moisturizing lotion as directed Prim Dressing: Xeroform Occlusive Gauze Dressing, 4x4 in 1 x Per Week/30 Days ary Discharge Instructions: Apply to wound bed as instructed Secondary Dressing: Optifoam Non-Adhesive Dressing, 4x4 in 1 x Per Week/30 Days Discharge Instructions: Apply TO LEFT ANTERIOR ANKLE TO PROTECT ANKLE. Secondary Dressing: Woven Gauze Sponge, Non-Sterile 4x4 in 1 x Per Week/30 Days Discharge Instructions: Apply over primary dressing as directed. Compression Wrap: ThreePress (3 layer compression wrap) 1 x Per Week/30  Days Discharge Instructions: Apply three layer use kerlix instead of cotton compression as directed. Wound #7 - Malleolus Wound Laterality: Right, Medial Peri-Wound Care: Triamcinolone 15 (g) 1 x Per Week/30 Days Discharge Instructions: Use triamcinolone 15 (g) as directed Peri-Wound Care: Sween Lotion (Moisturizing lotion) 1 x Per Week/30 Days Discharge Instructions: Apply moisturizing lotion as directed Topical: Keystone topical compounding antibiotics 1 x Per Week/30 Days Discharge Instructions: **HOLD FOR THIS WEEK**apply directly to wound bed. Prim Dressing: Promogran Prisma Matrix, 4.34 (sq in) (silver collagen) 1 x Per Week/30 Days ary Discharge Instructions: Moisten collagen with saline or hydrogel Secondary Dressing: ABD Pad, 8x10 1 x Per Week/30 Days Discharge Instructions: Apply over primary dressing as directed. Secondary Dressing: Drawtex 4x4 in 1 x Per Week/30 Days Discharge Instructions: Apply over primary dressing as directed. Compression Wrap: ThreePress (3 layer compression wrap) 1 x Per Week/30 Days Discharge Instructions: Apply three layer use kerlix instead of cotton compression as directed. Electronic Signature(s) Signed: 10/06/2021 5:27:13 PM By: [name] PA-C Signed: 10/06/2021 5:54:22 PM By: Deon Pilling RN, BSN Entered By: Deon Pilling on 10/06/2021 15:26:12 -------------------------------------------------------------------------------- Problem List Details Patient Name: Date of Service: [name] [name] [name]. 10/06/2021 3:00 PM Medical Record Number: [id number] Patient Account Number: [id number] Date of Birth/Sex: Treating RN: 27-Feb-1955 (66 y.o. [name], [name] Primary Care Provider: Marton [name] Other Clinician: Referring Provider: Treating Provider/Extender: [name] [name] in Treatment: 9 Active Problems ICD-10 Encounter Code Description Active Date MDM Code Description Active Date MDM Diagnosis I89.0 Lymphedema, not  elsewhere classified 08/04/2021 No Yes I87.333 Chronic venous hypertension (idiopathic) with ulcer and inflammation of 08/04/2021 No Yes bilateral lower extremity L97.828 Non-pressure chronic ulcer of other part of left lower leg with other specified 08/04/2021 No Yes severity L97.818 Non-pressure chronic ulcer of other part of right lower leg with other specified 08/04/2021 No Yes severity E11.621 Type 2 diabetes mellitus with foot ulcer 08/04/2021 No Yes L97.518 Non-pressure chronic ulcer of other part of right foot with other specified 08/04/2021 No Yes severity Inactive Problems Resolved Problems Electronic Signature(s) Signed: 10/06/2021 3:08:32 PM By: [name] PA-C Entered By: Worthy [name] on 10/06/2021 15:08:32 -------------------------------------------------------------------------------- Progress Note Details Patient Name: Date of Service: [name] [name] [name].  10/06/2021 3:00 PM Medical Record Number: [id number] Patient Account Number: [id number] Date of Birth/Sex: Treating RN: 25-May-1955 (66 y.o. [name] [name] Primary Care Provider: Marton [name] Other Clinician: Referring Provider: Treating Provider/Extender: [name] [name] in Treatment: 9 Subjective Chief Complaint Information obtained from Patient 08/04/2021; patient returns to clinic with bilateral leg wounds as well as areas on the right foot History of Present Illness (HPI) ADMISSION 03/22/2021 This is a 66 year old man with a past medical history significant for diabetes type 2, congestive heart failure, peripheral arterial disease, morbid obesity, venous insufficiency, and coronary artery disease. He has been followed by Dr. Earleen [name] in podiatry, who performed a transmetatarsal amputation on the left foot in August 2022. He had issues healing that wound, but based upon Dr. Pasty [name] notes, ultimately the TMA wound healed. During his recovery from that surgery, however, ulcers opened up over the  DIP joint of the right second and third toe. These have apparently closed and reopened multiple times. It sounds like one of the issues has been moisture accumulation and maceration of the tissues causing them to reopen. At his last visit with Dr. Earleen [name], on March 01, 2021, there continues to be problems with moisture and he was referred to wound care for further evaluation and management. He had a formal aortogram with runoff performed prior to his TMA. The findings are copied here: Patient has inline flow to both feet with no significant flow-limiting lesion that would be amenable to percutaneous or open revascularization. He does have an element of small vessel disease and has a short segment occlusion of the distal anterior tibial/dorsalis pedis artery on the left foot but does have posterior tibial artery flow. Would recommend management of wounds with amputation of toes 2 and 3 on the right foot if the wounds do not heal and deteriorate. Transmetatarsal amputation on the left side has as good a blood supply as it is going to get and hopefully this will heal in the future. Formal ABIs were done in January 2023. They are normal bilaterally. ABI Findings: +---------+------------------+-----+----------+--------+ Right Rt Pressure (mmHg)IndexWaveform Comment  +---------+------------------+-----+----------+--------+ Brachial 160    +---------+------------------+-----+----------+--------+ PTA 192 1.06 monophasic  +---------+------------------+-----+----------+--------+ DP 159 0.88 monophasic  +---------+------------------+-----+----------+--------+ Great T oe145 0.80    +---------+------------------+-----+----------+--------+ +--------+------------------+-----+---------+-------+ Left Lt Pressure (mmHg)IndexWaveform Comment +--------+------------------+-----+---------+-------+ [id number]     +--------+------------------+-----+---------+-------+ PTA 204 1.13 triphasic  +--------+------------------+-----+---------+-------+ DP 194 1.07 biphasic   +--------+------------------+-----+---------+-------+ +-------+-----------+-----------+------------+------------+ ABI/TBIT oday's ABIT oday's TBIPrevious ABIPrevious TBI +-------+-----------+-----------+------------+------------+ Right 1.06 0.8 1.26 0.65  +-------+-----------+-----------+------------+------------+ Left 1.13 amputation 1.15 amputation  +-------+-----------+-----------+------------+------------+ Previous ABI on 08/06/20 at [facility] Pedal pressures falsely elevated due to medial calcification. Summary: Right: Resting right ankle-brachial index is within normal range. The right toe-brachial index is normal. Left: Resting left ankle-brachial index is within normal range. READMISSION 08/04/2021 Mr. Galen is now a 66 year old man who I remember from this clinic many years ago I think he had a right lower extremity predominantly venous wound at the time. He was here for 1 visit in March of this year had wounds on his right second and third toes we apparently dressed them many and they healed so he did not come back. He is listed in [place] is being a diabetic although the patient denies this says he is verified it with his primary doctor. In any case over the last several weeks or so according the patient although these wounds look somewhat more chronic than that he has developed predominantly large wounds on the right medial and  right lateral ankle smaller areas on the left leg and areas on the dorsal aspect of his right second and third toes. Its not clear how he has been dressing these. 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 The patient has known PAD. He had an angiogram in August 2022 at which time he had  nonhealing wounds in both feet. On the left his major vessels in the thigh were all patent. He had three-vessel patent to the level of the ankle. He had a very short occlusion in the left anterior tibial. On the right lower extremity the major vessels in his thigh were all patent. He had three-vessel runoff to the foot sluggish filling of the anterior tibial artery. He was felt to have some component of small vessel disease but nothing that was amenable or needed revascularization. It was recommended that he have amputation of the second and third toes on the right foot if they did not heal He has been following with Dr. Earleen [name] of podiatry. Dr. Earleen [name] got him juxta lite stockings although I do not think he had them on properly he has uncontrolled edema in both legs Past medical history includes type 2 diabetes [although the patient really denies this], left TMA in 2022,lower extremity wounds in fact attendance at this clinic in 2009-2010, A-fib on Eliquis, chronic venous insufficiency with secondary lymphedema history of non-Hodgkin's lymphoma. 08-11-2021 upon evaluation today patient presents for follow-up evaluation he was seen last Wednesday initially for inspection here in our clinic. With that being said he tells me that he unfortunately has been having a lot of drainage and is actually coming through his wrap. Fortunately I do not see any evidence of active infection locally or systemically at this time which is great news. No fevers, chills, nausea, vomiting, or diarrhea. With that being said there does appear to be some evidence of local infection based on what I am seeing today. 08-18-2021 upon evaluation today patient appears to be doing okay currently in regard to his wounds with that being said that he is doing much better but still has a long ways to go to get to where he wanted to be. I think the infection is significantly improved. He has another week of the antibiotic at this  point. 08-25-2021 upon evaluation today patient's wounds are actually doing decently well he has erythema has significantly improved. I think the cellulitis is under controlled I am going to continue him on 2 more weeks of the Levaquin at 500 mg this is a lower dose but I am hoping it will be better for him. 09-08-2021 upon evaluation today patient appears to be doing excellent in regard to his wounds. Since I last saw him he was actually in the hospital where he ended up having a pacemaker put in. Subsequently he tells me that he is actually doing quite well although they were unsure whether they were going to do it due to the fact that he had the wounds on his legs. And then I am glad they did anything seem to be doing well. 09-15-2021 upon evaluation patient's wounds are actually showing signs of improvement. The right side wounds do appear to have some need for sharp debridement today and I Georgina Peer go ahead and proceed with that. I think that if we get the wounds cleaned up he will actually show signs of continued improvement. I am also leaning towards switching to [facility]  Blue which I think will be a much better option for him. 09-22-2021 patient's wounds are showing signs of excellent improvement. I am actually extremely pleased with where we stand and I think that the patient is making great progress. There does not appear to be any signs of active infection. 09-29-2021 upon evaluation today patient appears to be doing excellent in regard to his wounds. He is actually tolerating the dressing changes without complication. Fortunately I see no evidence of active infection locally or systemically at this time which is great news and overall I am extremely pleased with where we stand currently. 09-2721 upon evaluation today patient actually appears to be doing excellent in regard to his wounds. The left leg is almost completely healed the right ankle is significantly smaller. Overall I am extremely  pleased with where we stand at this point. I do not see any evidence of active infection at this time. Objective Constitutional Well-nourished and well-hydrated in no acute distress. Vitals Time Taken: 3:14 PM, Height: 71 in, Weight: 350 lbs, BMI: 48.8, Temperature: 97.7 F, Pulse: 87 bpm, Respiratory Rate: 18 breaths/min, Blood Pressure: 137/85 mmHg. Respiratory normal breathing without difficulty. Psychiatric this patient is able to make decisions and demonstrates good insight into disease process. Alert and Oriented x 3. pleasant and cooperative. General Notes: Upon inspection patient's wound bed actually showed signs of good granulation and epithelization at this point. Fortunately I do not see any evidence of active systemic infection which is great news and I think he is actually making good progress working to go ahead and continue with the [facility] topical antibiotics at this point. Integumentary (Hair, Skin) Wound #12 status is Open. Original cause of wound was Gradually Appeared. The date acquired was: 07/15/2021. The wound has been in treatment 9 weeks. The wound is located on the Left,Anterior Lower Leg. The wound measures 1cm length x 1cm width x 0.1cm depth; 0.785cm^2 area and 0.079cm^3 volume. There is Fat Layer (Subcutaneous Tissue) exposed. There is no tunneling or undermining noted. There is a large amount of serosanguineous drainage noted. The wound margin is distinct with the outline attached to the wound base. There is large (67-100%) red, pink granulation within the wound bed. There is a small (1-33%) amount of necrotic tissue within the wound bed including Adherent Slough. Wound #7 status is Open. Original cause of wound was Blister. The date acquired was: 07/21/2021. The wound has been in treatment 9 weeks. The wound is located on the Right,Medial Malleolus. The wound measures 5cm length x 3.9cm width x 0.3cm depth; 15.315cm^2 area and 4.595cm^3 volume. There is Fat Layer  (Subcutaneous Tissue) exposed. There is no tunneling or undermining noted. There is a medium amount of serosanguineous drainage noted. The wound margin is distinct with the outline attached to the wound base. There is large (67-100%) red granulation within the wound bed. There is a small (1-33%) amount of necrotic tissue within the wound bed including Adherent Slough. 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 There was a Three Layer Compression Therapy  Procedure by Deon Pilling, RN. Post procedure Diagnosis Wound #12: Same as Pre-Procedure Wound #7 Pre-procedure diagnosis of Wound #7 is a Venous Leg Ulcer located on the Right,Medial Malleolus . There was a Three Layer Compression Therapy Procedure by Deon Pilling, RN. Post procedure Diagnosis Wound #7: Same as Pre-Procedure Plan Follow-up Appointments: Return Appointment in 1 week. Margarita Grizzle and [place], Room 8 [facility] Return Appointment in 2 weeks. Margarita Grizzle and [place], Room 8 [facility] Other: - [place] topical compounding antibiotics- bring to your appointments. bring in compression stockings to weekly appts. Anesthetic: (In clinic) Topical Lidocaine 5% applied to wound bed Bathing/ Shower/ Hygiene: May shower with protection but do not get wound dressing(s) wet. Edema Control - Lymphedema / SCD / Other: Elevate legs to the level of the heart or above for 30 minutes daily and/or when  sitting, a frequency of: - 3-4 times a day throughout the day. Avoid standing for long periods of time. Exercise regularly WOUND #12: - Lower Leg Wound Laterality: Left, Anterior Peri-Wound Care: Triamcinolone 15 (g) 1 x Per Week/30 Days Discharge Instructions: Use triamcinolone 15 (g) as directed Peri-Wound Care: Sween Lotion (Moisturizing lotion) 1 x Per Week/30 Days Discharge Instructions: Apply moisturizing lotion as directed Prim Dressing: Xeroform Occlusive Gauze Dressing, 4x4 in 1 x Per Week/30 Days ary Discharge Instructions: Apply to wound bed as instructed Secondary Dressing: Optifoam Non-Adhesive Dressing, 4x4 in 1 x Per Week/30 Days Discharge Instructions: Apply TO LEFT ANTERIOR ANKLE TO PROTECT ANKLE. Secondary Dressing: Woven Gauze Sponge, Non-Sterile 4x4 in 1 x Per Week/30 Days Discharge Instructions: Apply over primary dressing as directed. Com pression Wrap: ThreePress (3 layer compression wrap) 1 x Per Week/30 Days Discharge Instructions: Apply three layer use kerlix instead of cotton compression as directed. WOUND #7: - Malleolus Wound Laterality: Right, Medial Peri-Wound Care: Triamcinolone 15 (g) 1 x Per Week/30 Days Discharge Instructions: Use triamcinolone 15 (g) as directed Peri-Wound Care: Sween Lotion (Moisturizing lotion) 1 x Per Week/30 Days Discharge Instructions: Apply moisturizing lotion as directed Topical: [facility] topical compounding antibiotics 1 x Per Week/30 Days Discharge Instructions: **HOLD FOR THIS WEEK**apply directly to wound bed. Prim Dressing: Promogran Prisma Matrix, 4.34 (sq in) (silver collagen) 1 x Per Week/30 Days ary Discharge Instructions: Moisten collagen with saline or hydrogel Secondary Dressing: ABD Pad, 8x10 1 x Per Week/30 Days Discharge Instructions: Apply over primary dressing as directed. Secondary Dressing: Drawtex 4x4 in 1 x Per Week/30 Days Discharge Instructions: Apply over primary dressing as directed. Com pression Wrap:  ThreePress (3 layer compression wrap) 1 x Per Week/30 Days Discharge Instructions: Apply three layer use kerlix instead of cotton compression as directed. 1. I am good recommend that we go ahead and continue with the wound care measures as before and the patient is in agreement with plan this includes the use of the topical tea [facility] antibiotics. 2. We will continue with the silver collagen cover followed by drawtex on the ankle region. We are using Xeroform on the left leg. 3. We will continue with the 3 layer compression wraps bilaterally. We will see patient back for reevaluation in 1 week here in the clinic. If anything worsens or changes patient will contact our office for additional recommendations. Electronic Signature(s) Signed: 10/06/2021 5:24:03 PM By: [name] PA-C Entered By: Worthy [name] on 10/06/2021 17:24:03 -------------------------------------------------------------------------------- SuperBill Details Patient Name: Date of Service: [name] [name] [name]. 10/06/2021 Medical Record Number: [id number] Patient Account Number: [id number] Date of Birth/Sex: Treating RN: 03-01-1955 (66 y.o. M)  Deon Pilling Primary Care Provider: Marton [name] Other Clinician: Referring Provider: Treating Provider/Extender: [name] [name] in Treatment: 9 Diagnosis Coding ICD-10 Codes Code Description I89.0 Lymphedema, not elsewhere classified I87.333 Chronic venous hypertension (idiopathic) with ulcer and inflammation of bilateral lower extremity L97.828 Non-pressure chronic ulcer of other part of left lower leg with other specified severity L97.818 Non-pressure chronic ulcer of other part of right lower leg with other specified severity E11.621 Type 2 diabetes mellitus with foot ulcer L97.518 Non-pressure chronic ulcer of other part of right foot with other specified severity Facility Procedures CPT4: Code [id number] Description: 81 BILATERAL: Application of  multi-layer venous compression system; leg (below knee), including ankle and t. Modifier: Quantity: 1 Physician Procedures : CPT4 Code Description Modifier [id number] - WC PHYS LEVEL 3 - EST PT ICD-10 Diagnosis Description I89.0 Lymphedema, not elsewhere classified I87.333 Chronic venous hypertension (idiopathic) with ulcer and inflammation of bilateral lower extremity  L97.828 Non-pressure chronic ulcer of other part of left lower leg with other specified severity L97.818 Non-pressure chronic ulcer of other part of right lower leg with other specified severity Quantity: 1 Electronic Signature(s) Signed: 10/06/2021 5:24:27 PM By: [name] PA-C Entered By: Worthy [name] on 10/06/2021 17:24:26

## 2021-10-07 DIAGNOSIS — T148XXA Other injury of unspecified body region, initial encounter: Secondary | ICD-10-CM | POA: Diagnosis not present

## 2021-10-07 DIAGNOSIS — L089 Local infection of the skin and subcutaneous tissue, unspecified: Secondary | ICD-10-CM | POA: Diagnosis not present

## 2021-10-07 DIAGNOSIS — R5381 Other malaise: Secondary | ICD-10-CM | POA: Diagnosis not present

## 2021-10-07 DIAGNOSIS — G4733 Obstructive sleep apnea (adult) (pediatric): Secondary | ICD-10-CM | POA: Diagnosis not present

## 2021-10-07 NOTE — Progress Notes (Signed)
[name] ([id number]) , Visit Report for 10/06/2021 Arrival Information Details Patient Name: Date of Service: [name], [name] 10/06/2021 3:00 PM Medical Record Number: [id number] Patient Account Number: [id number] Date of Birth/Sex: Treating RN: 03-23-1955 (66 y.o. [name], [name] Primary Care [name]: [name] Other Clinician: Referring [name]: Treating [name]/Extender: [name] in Treatment: 9 Visit Information History Since Last Visit Added or deleted any medications: No Patient Arrived: [name] Any new allergies or adverse reactions: No Arrival Time: 15:12 Had a fall or experienced change in No Accompanied By: friend activities of daily living that may affect Transfer Assistance: None risk of falls: Patient Requires Transmission-Based Precautions: No Signs or symptoms of abuse/neglect since last visito No Patient Has Alerts: Yes Hospitalized since last visit: No Patient Alerts: Patient on Blood Thinner Implantable device outside of the clinic excluding No 01/2021 ABI L 1.13 R 1.06 cellular tissue based products placed in the center 01/2021 TBI L amp R 0.8 since last visit: Has Compression in Place as Prescribed: Yes Pain Present Now: No Electronic Signature(s) Signed: 10/06/2021 4:38:36 PM By: [name] Entered By: [name] on 10/06/2021 15:13:53 -------------------------------------------------------------------------------- Compression Therapy Details Patient Name: Date of Service: [name], [name] [name] D. 10/06/2021 3:00 PM Medical Record Number: [id number] Patient Account Number: [id number] Date of Birth/Sex: Treating RN: 1955-10-18 (66 y.o. [name] Primary Care [name]: [name] Other Clinician: Referring [name]: Treating [name]/Extender: [name] in Treatment: 9 Compression Therapy Performed for Wound Assessment: Wound #12 Left,Anterior Lower Leg Performed By: Clinician  [name] Pilling, RN Compression Type: Three Layer Post Procedure Diagnosis Same as Pre-procedure Electronic Signature(s) Signed: 10/06/2021 5:54:22 PM By: [name] Pilling RN, BSN Entered By: [name] on 10/06/2021 15:23:34 -------------------------------------------------------------------------------- Compression Therapy Details Patient Name: Date of Service: [name], [name] [name]. 10/06/2021 3:00 PM Medical Record Number: [id number] Patient Account Number: [id number] Date of Birth/Sex: Treating RN: 1955-01-24 (66 y.o. [name] Primary Care Sashia Campas: [name] Other Clinician: Referring [name]: Treating [name]/Extender: [name] in Treatment: 9 Compression Therapy Performed for Wound Assessment: Wound #7 Right,Medial Malleolus Performed By: Clinician [name] Pilling, RN Compression Type: Three Layer Post Procedure Diagnosis Same as Pre-procedure Electronic Signature(s) Signed: 10/06/2021 5:54:22 PM By: [name] Pilling RN, BSN Entered By: [name] on 10/06/2021 15:23:34 -------------------------------------------------------------------------------- Encounter Discharge Information Details Patient Name: Date of Service: [name], [name] [name]. 10/06/2021 3:00 PM Medical Record Number: [id number] Patient Account Number: [id number] Date of Birth/Sex: Treating RN: 08/20/1955 (66 y.o. [name] Primary Care [name]: [name] Other Clinician: Referring [name]: Treating [name]/Extender: [name] in Treatment: 9 Encounter Discharge Information Items Discharge Condition: Stable Ambulatory Status: Walker Discharge Destination: Home Transportation: Private Auto Accompanied By: friend Schedule Follow-up Appointment: Yes Clinical Summary of Care: Electronic Signature(s) Signed: 10/06/2021 5:54:22 PM By: [name] Pilling RN, BSN Entered By: [name] on 10/06/2021  15:28:13 -------------------------------------------------------------------------------- Lower Extremity Assessment Details Patient Name: Date of Service: [name], [name] [name]. 10/06/2021 3:00 PM Medical Record Number: [id number] Patient Account Number: [id number] Date of Birth/Sex: Treating RN: 22-May-1955 (66 y.o. [name] Primary Care [name]: [name] Other Clinician: Referring [name]: Treating [name]/Extender: [name] in Treatment: 9 Edema Assessment Assessed: [name]: No] [name]: No] Edema: [Left: Yes] [Right: Yes] Calf Left: Right: Point of Measurement: 31 cm From Medial Instep 38 cm 40 cm Ankle Left: Right: Point of Measurement: 11 cm From Medial Instep 26 cm 24.8 cm Electronic  Signature(s) Signed: 10/06/2021 4:38:36 PM By: [name] Signed: 10/06/2021 5:54:22 PM By: [name] Pilling RN, BSN Entered By: [name] on 10/06/2021 15:15:07 -------------------------------------------------------------------------------- Multi-Disciplinary Care Plan Details Patient Name: Date of Service: [name], [name] [name]. 10/06/2021 3:00 PM Medical Record Number: [id number] Patient Account Number: [id number] Date of Birth/Sex: Treating RN: 07-02-1955 (66 y.o. [name] Primary Care [name]: [name] Other Clinician: Referring [name]: Treating [name]/Extender: [name] in Treatment: 9 Multidisciplinary Care Plan reviewed with physician Active Inactive Pain, Acute or Chronic Nursing Diagnoses: Pain, acute or chronic: actual or potential Potential alteration in comfort, pain Goals: Patient will verbalize adequate pain control and receive pain control interventions during procedures as needed Date Initiated: 08/04/2021 Target Resolution Date: 11/05/2021 Goal Status: Active Patient/caregiver will verbalize comfort level met Date Initiated: 08/04/2021 Target Resolution Date: 11/05/2021 Goal Status:  Active Interventions: Encourage patient to take pain medications as prescribed Provide education on pain management Reposition patient for comfort Treatment Activities: Administer pain control measures as ordered : 08/04/2021 Notes: Venous Leg Ulcer Nursing Diagnoses: Knowledge deficit related to disease process and management Goals: Non-invasive venous studies are completed as ordered Date Initiated: 08/04/2021 Date Inactivated: 08/25/2021 Target Resolution Date: 08/13/2021 Goal Status: Met Patient/caregiver will verbalize understanding of disease process and disease management Date Initiated: 08/04/2021 Target Resolution Date: 11/05/2021 Goal Status: Active Interventions: Assess peripheral edema status every visit. Compression as ordered Treatment Activities: Non-invasive vascular studies : 08/04/2021 Notes: Wound/Skin Impairment Nursing Diagnoses: Knowledge deficit related to ulceration/compromised skin integrity Goals: Patient/caregiver will verbalize understanding of skin care regimen Date Initiated: 08/04/2021 Target Resolution Date: 11/05/2021 Goal Status: Active Interventions: Assess patient/caregiver ability to perform ulcer/skin care regimen upon admission and as needed Assess ulceration(s) every visit Provide education on ulcer and skin care Treatment Activities: Skin care regimen initiated : 08/04/2021 Topical wound management initiated : 08/04/2021 Notes: Electronic Signature(s) Signed: 10/06/2021 5:54:22 PM By: [name] Pilling RN, BSN Entered By: [name] on 10/06/2021 15:26:45 -------------------------------------------------------------------------------- Pain Assessment Details Patient Name: Date of Service: [name], [name] [name]. 10/06/2021 3:00 PM Medical Record Number: [id number] Patient Account Number: [id number] Date of Birth/Sex: Treating RN: [birth year] (66 y.o. [name] Primary Care Adayah Arocho: [name] Other Clinician: Referring  Seairra Otani: Treating Vendela Troung/Extender: [name] in Treatment: 9 Active Problems Location of Pain Severity and Description of Pain Patient Has Paino No Site Locations Pain Management and Medication Current Pain Management: Electronic Signature(s) Signed: 10/06/2021 4:38:36 PM By: [name] Signed: 10/06/2021 5:54:22 PM By: [name] Pilling RN, BSN Entered By: [name] on 10/06/2021 15:14:32 -------------------------------------------------------------------------------- Patient/Caregiver Education Details Patient Name: Date of Service: [name], [name] 9/27/2023andnbsp3:00 PM Medical Record Number: [id number] Patient Account Number: [id number] Date of Birth/Gender: Treating RN: Aug 19, 1955 (66 y.o. [name] Primary Care Physician: [name] Other Clinician: Referring Physician: Treating Physician/Extender: [name] in Treatment: 9 Education Assessment Education Provided To: Patient Education Topics Provided Wound/Skin Impairment: Handouts: Skin Care Do's and Dont's Methods: Explain/Verbal Responses: Reinforcements needed Electronic Signature(s) Signed: 10/06/2021 5:54:22 PM By: [name] Pilling RN, BSN Entered By: [name] on 10/06/2021 15:26:55 -------------------------------------------------------------------------------- Wound Assessment Details Patient Name: Date of Service: [name], [name] [name]. 10/06/2021 3:00 PM Medical Record Number: [id number] Patient Account Number: [id number] Date of Birth/Sex: Treating RN: [birth year] (66 y.o. [name] Primary Care Zavia Pullen: [name] Other Clinician: Referring Ashford Clouse: Treating Rache Klimaszewski/Extender: [name] in Treatment: 9 Wound Status Wound Number: 12 Primary Venous Leg Ulcer  Etiology: Wound Location: Left, Anterior Lower Leg Secondary Lymphedema Wounding Event: Gradually Appeared Etiology: Date Acquired:  07/15/2021 Wound Open Weeks Of Treatment: 9 Status: Clustered Wound: No Comorbid Sleep Apnea, Arrhythmia, Congestive Heart Failure, History: Hypertension, Hypotension, Peripheral Arterial Disease, Peripheral Venous Disease, Type II Diabetes, Gout, Osteoarthritis Photos Wound Measurements Length: (cm) 1 Width: (cm) 1 Depth: (cm) 0.1 Area: (cm) 0.785 Volume: (cm) 0.079 % Reduction in Area: 94.3% % Reduction in Volume: 94.2% Epithelialization: Medium (34-66%) Tunneling: No Undermining: No Wound Description Classification: Full Thickness Without Exposed Support Structures Wound Margin: Distinct, outline attached Exudate Amount: Large Exudate Type: Serosanguineous Exudate Color: red, brown Foul Odor After Cleansing: No Slough/Fibrino Yes Wound Bed Granulation Amount: Large (67-100%) Exposed Structure Granulation Quality: Red, Pink Fascia Exposed: No Necrotic Amount: Small (1-33%) Fat Layer (Subcutaneous Tissue) Exposed: Yes Necrotic Quality: Adherent Slough Tendon Exposed: No Muscle Exposed: No Joint Exposed: No Bone Exposed: No Treatment Notes Wound #12 (Lower Leg) Wound Laterality: Left, Anterior Cleanser Peri-Wound Care Triamcinolone 15 (g) Discharge Instruction: Use triamcinolone 15 (g) as directed Sween Lotion (Moisturizing lotion) Discharge Instruction: Apply moisturizing lotion as directed Topical Primary Dressing Xeroform Occlusive Gauze Dressing, 4x4 in Discharge Instruction: Apply to wound bed as instructed Secondary Dressing Optifoam Non-Adhesive Dressing, 4x4 in Discharge Instruction: Earle. Woven Gauze Sponge, Non-Sterile 4x4 in Discharge Instruction: Apply over primary dressing as directed. Secured With Compression Wrap ThreePress (3 layer compression wrap) Discharge Instruction: Apply three layer use kerlix instead of cotton compression as directed. Compression Stockings Add-Ons Electronic  Signature(s) Signed: 10/06/2021 5:54:22 PM By: [name] Pilling RN, BSN Signed: 10/07/2021 4:19:15 PM By: Rhae Hammock RN Entered By: Rhae Hammock on 10/06/2021 15:20:12 -------------------------------------------------------------------------------- Wound Assessment Details Patient Name: Date of Service: [name], [name] [name]. 10/06/2021 3:00 PM Medical Record Number: [id number] Patient Account Number: [id number] Date of Birth/Sex: Treating RN: 08-25-1955 (66 y.o. [name], [name] Primary Care Koah Chisenhall: [name] Other Clinician: Referring Aldon Hengst: Treating Marquavius Scaife/Extender: [name] in Treatment: 9 Wound Status Wound Number: 7 Primary Venous Leg Ulcer Etiology: Wound Location: Right, Medial Malleolus Secondary Lymphedema Wounding Event: Blister Etiology: Date Acquired: 07/21/2021 Wound Open Weeks Of Treatment: 9 Status: Clustered Wound: No Comorbid Sleep Apnea, Arrhythmia, Congestive Heart Failure, History: Hypertension, Hypotension, Peripheral Arterial Disease, Peripheral Venous Disease, Type II Diabetes, Gout, Osteoarthritis Photos Wound Measurements Length: (cm) 5 Width: (cm) 3.9 Depth: (cm) 0.3 Area: (cm) 15.315 Volume: (cm) 4.595 % Reduction in Area: 60% % Reduction in Volume: 82.9% Epithelialization: Medium (34-66%) Tunneling: No Undermining: No Wound Description Classification: Full Thickness With Exposed Support Structures Wound Margin: Distinct, outline attached Exudate Amount: Medium Exudate Type: Serosanguineous Exudate Color: red, brown Foul Odor After Cleansing: No Slough/Fibrino Yes Wound Bed Granulation Amount: Large (67-100%) Exposed Structure Granulation Quality: Red Fascia Exposed: No Necrotic Amount: Small (1-33%) Fat Layer (Subcutaneous Tissue) Exposed: Yes Necrotic Quality: Adherent Slough Tendon Exposed: No Muscle Exposed: No Joint Exposed: No Bone Exposed: No Treatment Notes Wound #7 (Malleolus)  Wound Laterality: Right, Medial Cleanser Peri-Wound Care Triamcinolone 15 (g) Discharge Instruction: Use triamcinolone 15 (g) as directed Sween Lotion (Moisturizing lotion) Discharge Instruction: Apply moisturizing lotion as directed Topical Keystone topical compounding antibiotics Discharge Instruction: **HOLD FOR THIS WEEK**apply directly to wound bed. Primary Dressing Promogran Prisma Matrix, 4.34 (sq in) (silver collagen) Discharge Instruction: Moisten collagen with saline or hydrogel Secondary Dressing ABD Pad, 8x10 Discharge Instruction: Apply over primary dressing as directed. Drawtex 4x4 in Discharge Instruction: Apply over primary dressing as directed. Secured  With Compression Wrap ThreePress (3 layer compression wrap) Discharge Instruction: Apply three layer use kerlix instead of cotton compression as directed. Compression Stockings Add-Ons Electronic Signature(s) Signed: 10/06/2021 5:54:22 PM By: [name] Pilling RN, BSN Signed: 10/07/2021 4:19:15 PM By: Rhae Hammock RN Entered By: Rhae Hammock on 10/06/2021 15:20:30 -------------------------------------------------------------------------------- Vitals Details Patient Name: Date of Service: [name], [name] [name]. 10/06/2021 3:00 PM Medical Record Number: [id number] Patient Account Number: [id number] Date of Birth/Sex: Treating RN: 22-Sep-1955 (66 y.o. [name], [name] Primary Care Ranny Wiebelhaus: [name] Other Clinician: Referring Clary Boulais: Treating Aurelie Dicenzo/Extender: [name] in Treatment: 9 Vital Signs Time Taken: 15:14 Temperature (F): 97.7 Height (in): 71 Pulse (bpm): 87 Weight (lbs): 350 Respiratory Rate (breaths/min): 18 Body Mass Index (BMI): 48.8 Blood Pressure (mmHg): 137/85 Reference Range: 80 - 120 mg / dl Electronic Signature(s) Signed: 10/06/2021 4:38:36 PM By: [name] Entered By: [name] on 10/06/2021 15:14:24

## 2021-10-11 ENCOUNTER — Other Ambulatory Visit (HOSPITAL_COMMUNITY): Payer: Self-pay | Admitting: Family Medicine

## 2021-10-11 DIAGNOSIS — I5022 Chronic systolic (congestive) heart failure: Secondary | ICD-10-CM | POA: Diagnosis not present

## 2021-10-13 ENCOUNTER — Encounter (HOSPITAL_BASED_OUTPATIENT_CLINIC_OR_DEPARTMENT_OTHER): Payer: Medicare Other | Attending: Physician Assistant | Admitting: Physician Assistant

## 2021-10-13 DIAGNOSIS — I87333 Chronic venous hypertension (idiopathic) with ulcer and inflammation of bilateral lower extremity: Secondary | ICD-10-CM | POA: Diagnosis not present

## 2021-10-13 DIAGNOSIS — L97518 Non-pressure chronic ulcer of other part of right foot with other specified severity: Secondary | ICD-10-CM | POA: Diagnosis not present

## 2021-10-13 DIAGNOSIS — E11621 Type 2 diabetes mellitus with foot ulcer: Secondary | ICD-10-CM | POA: Insufficient documentation

## 2021-10-13 DIAGNOSIS — L97828 Non-pressure chronic ulcer of other part of left lower leg with other specified severity: Secondary | ICD-10-CM | POA: Diagnosis not present

## 2021-10-13 DIAGNOSIS — L97312 Non-pressure chronic ulcer of right ankle with fat layer exposed: Secondary | ICD-10-CM | POA: Diagnosis not present

## 2021-10-13 DIAGNOSIS — I890 Lymphedema, not elsewhere classified: Secondary | ICD-10-CM | POA: Insufficient documentation

## 2021-10-13 DIAGNOSIS — L97818 Non-pressure chronic ulcer of other part of right lower leg with other specified severity: Secondary | ICD-10-CM | POA: Diagnosis not present

## 2021-10-13 DIAGNOSIS — Z6841 Body Mass Index (BMI) 40.0 and over, adult: Secondary | ICD-10-CM | POA: Diagnosis not present

## 2021-10-13 DIAGNOSIS — Z89422 Acquired absence of other left toe(s): Secondary | ICD-10-CM | POA: Diagnosis not present

## 2021-10-13 DIAGNOSIS — I2510 Atherosclerotic heart disease of native coronary artery without angina pectoris: Secondary | ICD-10-CM | POA: Insufficient documentation

## 2021-10-13 DIAGNOSIS — L97822 Non-pressure chronic ulcer of other part of left lower leg with fat layer exposed: Secondary | ICD-10-CM | POA: Diagnosis not present

## 2021-10-13 NOTE — Progress Notes (Signed)
[name] ([id number]) , Visit Report for 10/13/2021 Chief Complaint Document Details Patient Name: Date of Service: [name], [name] 10/13/2021 3:00 PM Medical Record Number: [id number] Patient Account Number: [id number] Date of Birth/Sex: Treating RN: Nov 23, 1955 (66 y.o. M) Primary Care Provider: [name] Other Clinician: Referring Provider: Treating Provider/Extender: [name] in Treatment: 10 Information Obtained from: Patient Chief Complaint 08/04/2021; patient returns to clinic with bilateral leg wounds as well as areas on the right foot Electronic Signature(s) Signed: 10/13/2021 3:10:15 PM By: [name] PA-C Entered By: [name] on 10/13/2021 15:10:15 -------------------------------------------------------------------------------- Problem List Details Patient Name: Date of Service: Villed[place], [name] 10/13/2021 3:00 PM Medical Record Number: [id number] Patient Account Number: [id number] Date of Birth/Sex: Treating RN: Jan 20, 1955 (66 y.o. M) Primary Care Provider: [name] Other Clinician: Referring Provider: Treating Provider/Extender: [name] in Treatment: 10 Active Problems ICD-10 Encounter Code Description Active Date MDM Diagnosis I89.0 Lymphedema, not elsewhere classified 08/04/2021 No Yes I87.333 Chronic venous hypertension (idiopathic) with ulcer and inflammation of 08/04/2021 No Yes bilateral lower extremity L97.828 Non-pressure chronic ulcer of other part of left lower leg with other specified 08/04/2021 No Yes severity L97.818 Non-pressure chronic ulcer of other part of right lower leg with other specified 08/04/2021 No Yes severity E11.621 Type 2 diabetes mellitus with foot ulcer 08/04/2021 No Yes L97.518 Non-pressure chronic ulcer of other part of right foot with other specified 08/04/2021 No Yes severity Inactive Problems Resolved Problems Electronic Signature(s) Signed: 10/13/2021  3:10:06 PM By: [name] PA-C Entered By: [name] on 10/13/2021 15:10:06

## 2021-10-14 LAB — IMMUNOFIXATION, URINE

## 2021-10-14 NOTE — Progress Notes (Signed)
TAHJE BORAWSKI ([id number]) , Visit Report for 10/13/2021 Arrival Information Details Patient Name: Date of Service: [name], [name] 10/13/2021 3:00 PM Medical Record Number: [id number] Patient Account Number: [id number] Date of Birth/Sex: Treating RN: [birth year] (66 y.o. [name], [name] Primary Care Della Homan: [name] Other Clinician: Referring Enjoli Tidd: Treating Alistair Senft/Extender: [name] in Treatment: 10 Visit Information History Since Last Visit Added or deleted any medications: No Patient Arrived: Gilford Rile Any new allergies or adverse reactions: No Arrival Time: 14:48 Had a fall or experienced change in No Accompanied By: friend activities of daily living that may affect Transfer Assistance: None risk of falls: Patient Identification Verified: Yes Signs or symptoms of abuse/neglect since last visito No Secondary Verification Process Completed: Yes Hospitalized since last visit: No Patient Requires Transmission-Based Precautions: No Implantable device outside of the clinic excluding No Patient Has Alerts: Yes cellular tissue based products placed in the center Patient Alerts: Patient on Blood Thinner since last visit: 01/2021 ABI L 1.13 R 1.06 Has Dressing in Place as Prescribed: Yes 01/2021 TBI L amp R 0.8 Has Compression in Place as Prescribed: Yes Pain Present Now: No Electronic Signature(s) Signed: 10/14/2021 5:16:03 PM By: Deon Pilling RN, BSN Entered By: Deon Pilling on 10/13/2021 14:49:01 -------------------------------------------------------------------------------- Compression Therapy Details Patient Name: Date of Service: [name], [name] 10/13/2021 3:00 PM Medical Record Number: [id number] Patient Account Number: [id number] Date of Birth/Sex: Treating RN: Mar 04, 1955 (66 y.o. [name] Primary Care Jayton Popelka: [name] Other Clinician: Referring Hayzel Ruberg: Treating Kaylon Laroche/Extender: [name] in  Treatment: 10 Compression Therapy Performed for Wound Assessment: Wound #12 Left,Anterior Lower Leg Performed By: Clinician Deon Pilling, RN Compression Type: Three Layer Post Procedure Diagnosis Same as Pre-procedure Electronic Signature(s) Signed: 10/14/2021 5:16:03 PM By: Deon Pilling RN, BSN Entered By: Deon Pilling on 10/13/2021 15:17:01 -------------------------------------------------------------------------------- Compression Therapy Details Patient Name: Date of Service: [name], [name] 10/13/2021 3:00 PM Medical Record Number: [id number] Patient Account Number: [id number] Date of Birth/Sex: Treating RN: 08-14-1955 (66 y.o. [name] Primary Care Bao Bazen: [name] Other Clinician: Referring Cosandra Plouffe: Treating Airi Copado/Extender: [name] in Treatment: 10 Compression Therapy Performed for Wound Assessment: Wound #7 Right,Medial Malleolus Performed By: Clinician Deon Pilling, RN Compression Type: Three Layer Post Procedure Diagnosis Same as Pre-procedure Electronic Signature(s) Signed: 10/14/2021 5:16:03 PM By: Deon Pilling RN, BSN Entered By: Deon Pilling on 10/13/2021 15:17:01 -------------------------------------------------------------------------------- Encounter Discharge Information Details Patient Name: Date of Service: [name], [name] 10/13/2021 3:00 PM Medical Record Number: [id number] Patient Account Number: [id number] Date of Birth/Sex: Treating RN: 24-Apr-1955 (66 y.o. [name] Primary Care Ilario Dhaliwal: [name] Other Clinician: Referring Ninfa Giannelli: Treating Shekira Drummer/Extender: [name] in Treatment: 10 Encounter Discharge Information Items Discharge Condition: Stable Ambulatory Status: Walker Discharge Destination: Home Transportation: Private Auto Accompanied By: friend Schedule Follow-up Appointment: Yes Clinical Summary of Care: Electronic Signature(s) Signed:  10/14/2021 5:16:03 PM By: Deon Pilling RN, BSN Entered By: Deon Pilling on 10/13/2021 15:20:15 -------------------------------------------------------------------------------- Lower Extremity Assessment Details Patient Name: Date of Service: [name], [name] 10/13/2021 3:00 PM Medical Record Number: [id number] Patient Account Number: [id number] Date of Birth/Sex: Treating RN: 09-07-1955 (66 y.o. [name] Primary Care Nancylee Gaines: [name] Other Clinician: Referring Janus Vlcek: Treating Kripa Foskey/Extender: [name] in Treatment: 10 Edema Assessment Assessed: [name]: Yes] [name]: Yes] Edema: [Left: Yes] [Right: Yes] Calf Left: Right: Point of Measurement: 31 cm From Medial Instep 40 cm  38 cm Ankle Left: Right: Point of Measurement: 11 cm From Medial Instep 25 cm 27 cm Vascular Assessment Pulses: Dorsalis Pedis Palpable: [Right:Yes] Electronic Signature(s) Signed: 10/14/2021 5:16:03 PM By: Deon Pilling RN, BSN Entered By: Deon Pilling on 10/13/2021 14:54:54 -------------------------------------------------------------------------------- [place] Details Patient Name: Date of Service: [name], [name] 10/13/2021 3:00 PM Medical Record Number: [id number] Patient Account Number: [id number] Date of Birth/Sex: Treating RN: 26-Oct-1955 (66 y.o. [name] Primary Care Zacaria Pousson: [name] Other Clinician: Referring Eliza Green: Treating Avie Checo/Extender: [name] in Treatment: 10 Multidisciplinary Care Plan reviewed with physician Active Inactive Pain, Acute or Chronic Nursing Diagnoses: Pain, acute or chronic: actual or potential Potential alteration in comfort, pain Goals: Patient will verbalize adequate pain control and receive pain control interventions during procedures as needed Date Initiated: 08/04/2021 Target Resolution Date: 11/05/2021 Goal Status: Active Patient/caregiver will  verbalize comfort level met Date Initiated: 08/04/2021 Target Resolution Date: 11/05/2021 Goal Status: Active Interventions: Encourage patient to take pain medications as prescribed Provide education on pain management Reposition patient for comfort Treatment Activities: Administer pain control measures as ordered : 08/04/2021 Notes: Venous Leg Ulcer Nursing Diagnoses: Knowledge deficit related to disease process and management Goals: Non-invasive venous studies are completed as ordered Date Initiated: 08/04/2021 Date Inactivated: 08/25/2021 Target Resolution Date: 08/13/2021 Goal Status: Met Patient/caregiver will verbalize understanding of disease process and disease management Date Initiated: 08/04/2021 Target Resolution Date: 11/05/2021 Goal Status: Active Interventions: Assess peripheral edema status every visit. Compression as ordered Treatment Activities: Non-invasive vascular studies : 08/04/2021 Notes: Wound/Skin Impairment Nursing Diagnoses: Knowledge deficit related to ulceration/compromised skin integrity Goals: Patient/caregiver will verbalize understanding of skin care regimen Date Initiated: 08/04/2021 Target Resolution Date: 11/05/2021 Goal Status: Active Interventions: Assess patient/caregiver ability to perform ulcer/skin care regimen upon admission and as needed Assess ulceration(s) every visit Provide education on ulcer and skin care Treatment Activities: Skin care regimen initiated : 08/04/2021 Topical wound management initiated : 08/04/2021 Notes: Electronic Signature(s) Signed: 10/14/2021 5:16:03 PM By: Deon Pilling RN, BSN Entered By: Deon Pilling on 10/13/2021 15:13:11 -------------------------------------------------------------------------------- Pain Assessment Details Patient Name: Date of Service: [name], [name] 10/13/2021 3:00 PM Medical Record Number: [id number] Patient Account Number: [id number] Date of Birth/Sex: Treating RN: 1955-07-09  (66 y.o. [name] Primary Care Nitesh Pitstick: [name] Other Clinician: Referring Eashan Schipani: Treating Gildo Crisco/Extender: [name] in Treatment: 10 Active Problems Location of Pain Severity and Description of Pain Patient Has Paino No Site Locations Rate the pain. Rate the pain. Current Pain Level: 0 Pain Management and Medication Current Pain Management: Medication: No Cold Application: No Rest: No Massage: No Activity: No T.E.N.S.: No Heat Application: No Leg drop or elevation: No Is the Current Pain Management Adequate: Adequate How does your wound impact your activities of daily livingo Sleep: No Bathing: No Appetite: No Relationship With Others: No Bladder Continence: No Emotions: No Bowel Continence: No Work: No Toileting: No Drive: No Dressing: No Hobbies: No Engineer, maintenance) Signed: 10/14/2021 5:16:03 PM By: Deon Pilling RN, BSN Entered By: Deon Pilling on 10/13/2021 14:54:39 -------------------------------------------------------------------------------- Patient/Caregiver Education Details Patient Name: Date of Service: [name], [name] 10/4/2023andnbsp3:00 PM Medical Record Number: [id number] Patient Account Number: [id number] Date of Birth/Gender: Treating RN: 1955/09/16 (66 y.o. [name] Primary Care Physician: [name] Other Clinician: Referring Physician: Treating Physician/Extender: [name] in Treatment: 10 Education Assessment Education Provided To: Patient Education Topics Provided Wound/Skin Impairment: Handouts: Skin Care Do's and Dont's  Methods: Explain/Verbal Responses: Reinforcements needed Electronic Signature(s) Signed: 10/14/2021 5:16:03 PM By: Deon Pilling RN, BSN Entered By: Deon Pilling on 10/13/2021 15:13:31 -------------------------------------------------------------------------------- Wound Assessment Details Patient Name: Date of  Service: [name], [name] 10/13/2021 3:00 PM Medical Record Number: [id number] Patient Account Number: [id number] Date of Birth/Sex: Treating RN: [birth year] (66 y.o. [name], [name] Primary Care Jes Costales: [name] Other Clinician: Referring Jakhiya Brower: Treating Serina Nichter/Extender: [name] in Treatment: 10 Wound Status Wound Number: 12 Primary Venous Leg Ulcer Etiology: Wound Location: Left, Anterior Lower Leg Secondary Lymphedema Wounding Event: Gradually Appeared Etiology: Date Acquired: 07/15/2021 Wound Open Weeks Of Treatment: 10 Status: Clustered Wound: No Comorbid Sleep Apnea, Arrhythmia, Congestive Heart Failure, History: Hypertension, Hypotension, Peripheral Arterial Disease, Peripheral Venous Disease, Type II Diabetes, Gout, Osteoarthritis Photos Wound Measurements Length: (cm) 0.8 Width: (cm) 1 Depth: (cm) 0.1 Area: (cm) 0.628 Volume: (cm) 0.063 % Reduction in Area: 95.4% % Reduction in Volume: 95.4% Epithelialization: Large (67-100%) Tunneling: No Undermining: No Wound Description Classification: Full Thickness Without Exposed Support Structures Wound Margin: Distinct, outline attached Exudate Amount: Large Exudate Type: Serosanguineous Exudate Color: red, brown Foul Odor After Cleansing: No Slough/Fibrino Yes Wound Bed Granulation Amount: Large (67-100%) Exposed Structure Granulation Quality: Red, Pink Fascia Exposed: No Necrotic Amount: Small (1-33%) Fat Layer (Subcutaneous Tissue) Exposed: Yes Necrotic Quality: Adherent Slough Tendon Exposed: No Muscle Exposed: No Joint Exposed: No Bone Exposed: No Periwound Skin Texture Texture Color No Abnormalities Noted: No No Abnormalities Noted: No Callus: No Atrophie Blanche: No Crepitus: No Cyanosis: No Excoriation: No Ecchymosis: No Induration: No Erythema: No Rash: No Hemosiderin Staining: No Scarring: No Mottled: No Pallor: No Moisture Rubor: No No  Abnormalities Noted: No Dry / Scaly: No Maceration: Yes Treatment Notes Wound #12 (Lower Leg) Wound Laterality: Left, Anterior Cleanser Peri-Wound Care Triamcinolone 15 (g) Discharge Instruction: Use triamcinolone 15 (g) as directed Sween Lotion (Moisturizing lotion) Discharge Instruction: Apply moisturizing lotion as directed Topical Primary Dressing KerraCel Ag Gelling Fiber Dressing, 2x2 in (silver alginate) Discharge Instruction: Apply silver alginate to wound bed as instructed Secondary Dressing Optifoam Non-Adhesive Dressing, 4x4 in Discharge Instruction: [place]. Woven Gauze Sponge, Non-Sterile 4x4 in Discharge Instruction: Apply over primary dressing as directed. Secured With Compression Wrap ThreePress (3 layer compression wrap) Discharge Instruction: Apply three layer use kerlix instead of cotton compression as directed. Compression Stockings Add-Ons Electronic Signature(s) Signed: 10/14/2021 5:16:03 PM By: Deon Pilling RN, BSN Entered By: Deon Pilling on 10/13/2021 15:00:52 -------------------------------------------------------------------------------- Wound Assessment Details Patient Name: Date of Service: [name], [name] 10/13/2021 3:00 PM Medical Record Number: [id number] Patient Account Number: [id number] Date of Birth/Sex: Treating RN: 1955/02/20 (66 y.o. [name], [name] Primary Care Nilton Lave: [name] Other Clinician: Referring Hiba Garry: Treating Dorrell Mitcheltree/Extender: [name] in Treatment: 10 Wound Status Wound Number: 7 Primary Venous Leg Ulcer Etiology: Wound Location: Right, Medial Malleolus Secondary Lymphedema Wounding Event: Blister Etiology: Date Acquired: 07/21/2021 Wound Open Weeks Of Treatment: 10 Status: Clustered Wound: No Comorbid Sleep Apnea, Arrhythmia, Congestive Heart Failure, History: Hypertension, Hypotension, Peripheral Arterial Disease, Peripheral Venous  Disease, Type II Diabetes, Gout, Osteoarthritis Photos Wound Measurements Length: (cm) 4.8 Width: (cm) 3.8 Depth: (cm) 0.2 Area: (cm) 14.326 Volume: (cm) 2.865 % Reduction in Area: 62.6% % Reduction in Volume: 89.3% Epithelialization: Medium (34-66%) Tunneling: No Undermining: No Wound Description Classification: Full Thickness With Exposed Support Structures Wound Margin: Distinct, outline attached Exudate Amount: Medium Exudate Type: Serosanguineous Exudate Color: red, brown Foul  Odor After Cleansing: No Slough/Fibrino Yes Wound Bed Granulation Amount: Large (67-100%) Exposed Structure Granulation Quality: Red Fascia Exposed: No Necrotic Amount: Small (1-33%) Fat Layer (Subcutaneous Tissue) Exposed: Yes Necrotic Quality: Adherent Slough Tendon Exposed: No Muscle Exposed: No Joint Exposed: No Bone Exposed: No Periwound Skin Texture Texture Color No Abnormalities Noted: No No Abnormalities Noted: No Callus: No Atrophie Blanche: No Crepitus: No Cyanosis: No Excoriation: No Ecchymosis: No Induration: No Erythema: No Rash: No Hemosiderin Staining: No Scarring: No Mottled: No Pallor: No Moisture Rubor: No No Abnormalities Noted: No Dry / Scaly: No Maceration: No Treatment Notes Wound #7 (Malleolus) Wound Laterality: Right, Medial Cleanser Peri-Wound Care Triamcinolone 15 (g) Discharge Instruction: Use triamcinolone 15 (g) as directed Sween Lotion (Moisturizing lotion) Discharge Instruction: Apply moisturizing lotion as directed Topical [place] topical compounding antibiotics Discharge Instruction: **HOLD FOR THIS WEEK**apply directly to wound bed. Primary Dressing Promogran Prisma Matrix, 4.34 (sq in) (silver collagen) Discharge Instruction: Moisten collagen with saline or hydrogel Secondary Dressing ABD Pad, 8x10 Discharge Instruction: Apply over primary dressing as directed. Drawtex 4x4 in Discharge Instruction: Apply over primary dressing as  directed. Secured With Compression Wrap ThreePress (3 layer compression wrap) Discharge Instruction: Apply three layer use kerlix instead of cotton compression as directed. Compression Stockings Add-Ons Electronic Signature(s) Signed: 10/14/2021 5:16:03 PM By: Deon Pilling RN, BSN Entered By: Deon Pilling on 10/13/2021 15:01:07 -------------------------------------------------------------------------------- Vitals Details Patient Name: Date of Service: [name], [name] 10/13/2021 3:00 PM Medical Record Number: [id number] Patient Account Number: [id number] Date of Birth/Sex: Treating RN: November 15, 1955 (66 y.o. [name], [name] Primary Care Brynden Thune: [name] Other Clinician: Referring Nyle Limb: Treating Jinger Middlesworth/Extender: [name] in Treatment: 10 Vital Signs Time Taken: 14:45 Temperature (F): 97.7 Height (in): 71 Pulse (bpm): 93 Weight (lbs): 350 Respiratory Rate (breaths/min): 20 Body Mass Index (BMI): 48.8 Blood Pressure (mmHg): 166/91 Reference Range: 80 - 120 mg / dl Electronic Signature(s) Signed: 10/14/2021 5:16:03 PM By: Deon Pilling RN, BSN Entered By: Deon Pilling on 10/13/2021 14:49:15

## 2021-10-18 ENCOUNTER — Encounter (HOSPITAL_COMMUNITY)
Admission: RE | Admit: 2021-10-18 | Discharge: 2021-10-18 | Disposition: A | Discharge status: Home / Self Care | Payer: Medicare Other | Source: Ambulatory Visit | Attending: Family Medicine | Admitting: Family Medicine

## 2021-10-18 ENCOUNTER — Encounter (HOSPITAL_COMMUNITY): Payer: Medicare Other

## 2021-10-18 DIAGNOSIS — I5022 Chronic systolic (congestive) heart failure: Secondary | ICD-10-CM | POA: Insufficient documentation

## 2021-10-18 DIAGNOSIS — E854 Organ-limited amyloidosis: Secondary | ICD-10-CM | POA: Diagnosis not present

## 2021-10-18 MED ORDER — TECHNETIUM TC 99M PYROPHOSPHATE
20.2000 | Freq: Once | INTRAVENOUS | Status: AC | PRN
  Administered 2021-10-18: 20.2 via INTRAVENOUS
  Filled 2021-10-18: qty 21

## 2021-10-20 ENCOUNTER — Encounter (HOSPITAL_BASED_OUTPATIENT_CLINIC_OR_DEPARTMENT_OTHER): Payer: Medicare Other | Admitting: Physician Assistant

## 2021-10-20 DIAGNOSIS — I87333 Chronic venous hypertension (idiopathic) with ulcer and inflammation of bilateral lower extremity: Secondary | ICD-10-CM | POA: Diagnosis not present

## 2021-10-20 DIAGNOSIS — L97818 Non-pressure chronic ulcer of other part of right lower leg with other specified severity: Secondary | ICD-10-CM | POA: Diagnosis not present

## 2021-10-20 DIAGNOSIS — Z89422 Acquired absence of other left toe(s): Secondary | ICD-10-CM | POA: Diagnosis not present

## 2021-10-20 DIAGNOSIS — L97518 Non-pressure chronic ulcer of other part of right foot with other specified severity: Secondary | ICD-10-CM | POA: Diagnosis not present

## 2021-10-20 DIAGNOSIS — I872 Venous insufficiency (chronic) (peripheral): Secondary | ICD-10-CM | POA: Diagnosis not present

## 2021-10-20 DIAGNOSIS — L97312 Non-pressure chronic ulcer of right ankle with fat layer exposed: Secondary | ICD-10-CM | POA: Diagnosis not present

## 2021-10-20 DIAGNOSIS — L97828 Non-pressure chronic ulcer of other part of left lower leg with other specified severity: Secondary | ICD-10-CM | POA: Diagnosis not present

## 2021-10-20 DIAGNOSIS — I890 Lymphedema, not elsewhere classified: Secondary | ICD-10-CM | POA: Diagnosis not present

## 2021-10-20 DIAGNOSIS — Z6841 Body Mass Index (BMI) 40.0 and over, adult: Secondary | ICD-10-CM | POA: Diagnosis not present

## 2021-10-20 DIAGNOSIS — I2510 Atherosclerotic heart disease of native coronary artery without angina pectoris: Secondary | ICD-10-CM | POA: Diagnosis not present

## 2021-10-20 DIAGNOSIS — E11621 Type 2 diabetes mellitus with foot ulcer: Secondary | ICD-10-CM | POA: Diagnosis not present

## 2021-10-20 NOTE — Progress Notes (Signed)
[name], [name] ([id number]) [id number] Page 1 of 8 Visit Report for 10/20/2021 Arrival Information Details Patient Name: Date of Service: [name], [name] 10/20/2021 3:00 PM Medical Record Number: [id number] Patient Account Number: [id number] Date of Birth/Sex: Treating [name]: Jan 08, 1956 (66 y.o. M) Primary Care [name]: [name] Other Clinician: Referring [name]: Treating [name]/Extender: [name] in Treatment: 11 Visit Information History Since Last Visit Added or deleted any medications: No Patient Arrived: [name] Any new allergies or adverse reactions: No Arrival Time: 15:34 Had a fall or experienced change in No Accompanied By: friend activities of daily living that may affect Transfer Assistance: None risk of falls: Patient Identification Verified: Yes Signs or symptoms of abuse/neglect since last visito No Secondary Verification Process Completed: Yes Hospitalized since last visit: No Patient Requires Transmission-Based Precautions: No Has Compression in Place as Prescribed: Yes Patient Has Alerts: Yes Pain Present Now: No Patient Alerts: Patient on Blood Thinner 01/2021 ABI L 1.13 R 1.06 01/2021 TBI L amp R 0.8 Electronic Signature(s) Signed: 10/20/2021 4:39:54 PM By: [name] Entered By: [name] on 10/20/2021 15:35:42 -------------------------------------------------------------------------------- Compression Therapy Details Patient Name: Date of Service: [name], [name]. 10/20/2021 3:00 PM Medical Record Number: [id number] Patient Account Number: [id number] Date of Birth/Sex: Treating [name]: 09-08-1955 (66 y.o. [name] Primary Care [name]: [name] Other Clinician: Referring Kenli Waldo: Treating Atara Paterson/Extender: [name] in Treatment: 11 Compression Therapy Performed for Wound Assessment: Wound #12 Left,Anterior Lower Leg Performed By: Clinician [name] Pilling, [name] Compression Type: Three Layer Post Procedure Diagnosis Same as Pre-procedure Electronic Signature(s) Signed: 10/20/2021 6:11:16 PM By: [name] Entered By: [name] on 10/20/2021 16:29:29 -------------------------------------------------------------------------------- Compression Therapy Details Patient Name: Date of Service: [name], [name] [name]. 10/20/2021 3:00 PM [name], [name] ([id number]) [id number] Page 2 of 8 Medical Record Number: [id number] Patient Account Number: [id number] Date of Birth/Sex: Treating [name]: [birth year] (66 y.o. [name] Primary Care [name]: [name] Other Clinician: Referring Kaiyla Stahly: Treating [name]/Extender: [name] in Treatment: 11 Compression Therapy Performed for Wound Assessment: Wound #7 Right,Medial Malleolus Performed By: Clinician [name] Pilling, [name] Compression Type: Three Layer Post Procedure Diagnosis Same as Pre-procedure Electronic Signature(s) Signed: 10/20/2021 6:11:16 PM By: [name] Entered By: [name] on 10/20/2021 16:29:29 -------------------------------------------------------------------------------- Encounter Discharge Information Details Patient Name: Date of Service: [name], [name] [name]. 10/20/2021 3:00 PM Medical Record Number: [id number] Patient Account Number: [id number] Date of Birth/Sex: Treating [name]: 1955/06/19 (66 y.o. [name] Primary Care [name]: [name] Other Clinician: Referring Kareem Cathey: Treating [name]/Extender: [name] in Treatment: 11 Encounter Discharge Information Items Post Procedure Vitals Discharge Condition: Stable Temperature (F): 97.9 Ambulatory Status: Walker Pulse (bpm): 86 Discharge Destination: Home Respiratory Rate (breaths/min): 18 Transportation: Private Auto Blood Pressure (mmHg): 133/87 Accompanied By: friend Schedule Follow-up Appointment:  Yes Clinical Summary of Care: Electronic Signature(s) Signed: 10/20/2021 6:11:16 PM By: [name] Entered By: [name] on 10/20/2021 16:34:54 -------------------------------------------------------------------------------- Lower Extremity Assessment Details Patient Name: Date of Service: [name], [name] [name]. 10/20/2021 3:00 PM Medical Record Number: [id number] Patient Account Number: [id number] Date of Birth/Sex: Treating [name]: 02/08/1955 (66 y.o. M) Primary Care Anitta Tenny: [name] Other Clinician: Referring Syliva Mee: Treating [name]/Extender: [name] in Treatment: 11 Edema Assessment Assessed: [Left: No] [Right: No] Edema: [Left: Yes] [Right: Yes] Calf Left: Right: Point of Measurement: 31 cm From Medial Instep 41.5 cm 40 cm  [name], [name] ([id number]) [id number] Page 3 of 8 Ankle Left: Right: Point of Measurement: 11 cm From Medial Instep 25.5 cm 27 cm Electronic Signature(s) Signed: 10/20/2021 4:39:54 PM By: [name] Entered By: [name] on 10/20/2021 16:05:52 -------------------------------------------------------------------------------- Multi-Disciplinary Care Plan Details Patient Name: Date of Service: [name], [name] Huger [name]. 10/20/2021 3:00 PM Medical Record Number: [id number] Patient Account Number: [id number] Date of Birth/Sex: Treating [name]: 27-Jul-1955 (66 y.o. [name] Primary Care [name]: [name] Other Clinician: Referring [name]: Treating [name]/Extender: [name] in Treatment: [place] reviewed with physician Active Inactive Pain, Acute or Chronic Nursing Diagnoses: Pain, acute or chronic: actual or potential Potential alteration in comfort, pain Goals: Patient will verbalize adequate pain control and receive pain control interventions during procedures as needed Date Initiated: 08/04/2021 Target Resolution Date:  12/03/2021 Goal Status: Active Patient/caregiver will verbalize comfort level met Date Initiated: 08/04/2021 Target Resolution Date: 12/03/2021 Goal Status: Active Interventions: Encourage patient to take pain medications as prescribed Provide education on pain management Reposition patient for comfort Treatment Activities: Administer pain control measures as ordered : 08/04/2021 Notes: Electronic Signature(s) Signed: 10/20/2021 6:11:16 PM By: [name] Entered By: [name] on 10/20/2021 16:29:01 -------------------------------------------------------------------------------- Pain Assessment Details Patient Name: Date of Service: [name], [name] [name]. 10/20/2021 3:00 PM Medical Record Number: [id number] Patient Account Number: [id number] Date of Birth/Sex: Treating [name]: [birth year] (66 y.o. M) Primary Care Damyen Knoll: [name] Other Clinician: Referring Cassanda Walmer: Treating Onie Hayashi/Extender: [name] in Treatment: [address] [place] [name] ([id number]) [id number] Page 4 of 8 Active Problems Location of Pain Severity and Description of Pain Patient Has Paino No Site Locations Pain Management and Medication Current Pain Management: Electronic Signature(s) Signed: 10/20/2021 4:39:54 PM By: [name] Entered By: [name] on 10/20/2021 15:36:23 -------------------------------------------------------------------------------- Patient/Caregiver Education Details Patient Name: Date of Service: [name] 10/11/2023andnbsp3:00 PM Medical Record Number: [id number] Patient Account Number: [id number] Date of Birth/Gender: Treating [name]: 1955-05-28 (67 y.o. [name] Primary Care Physician: [name] Other Clinician: Referring Physician: Treating Physician/Extender: [name] in Treatment: 11 Education Assessment Education Provided To: Patient Education Topics  Provided Pain: Handouts: A Guide to Pain Control Methods: Explain/Verbal Responses: Reinforcements needed Electronic Signature(s) Signed: 10/20/2021 6:11:16 PM By: [name] Entered By: [name] on 10/20/2021 16:29:13 Dietze, [name] ([id number]) [id number] Page 5 of 8 -------------------------------------------------------------------------------- Wound Assessment Details Patient Name: Date of Service: [name], [name] 10/20/2021 3:00 PM Medical Record Number: [id number] Patient Account Number: [id number] Date of Birth/Sex: Treating [name]: February 16, 1955 (66 y.o. M) Primary Care Lenay Lovejoy: [name] Other Clinician: Referring Robena Ewy: Treating Nolin Grell/Extender: [name] in Treatment: 11 Wound Status Wound Number: 12 Primary Venous Leg Ulcer Etiology: Wound Location: Left, Anterior Lower Leg Secondary Lymphedema Wounding Event: Gradually Appeared Etiology: Date Acquired: 07/15/2021 Wound Open Weeks Of Treatment: 11 Status: Clustered Wound: No Comorbid Sleep Apnea, Arrhythmia, Congestive Heart Failure, History: Hypertension, Hypotension, Peripheral Arterial Disease, Peripheral Venous Disease, Type II Diabetes, Gout, Osteoarthritis Photos Wound Measurements Length: (cm) 1. Width: (cm) 2. Depth: (cm) 0. Area: (cm) 2 Volume: (cm) 0 5 % Reduction in Area: 78.5% 5 % Reduction in Volume: 78.4% 1 Epithelialization: Large (67-100%) .945 Tunneling: No .295 Undermining: No Wound Description Classification: Full Thickness Without Exposed Suppor Wound Margin: Distinct, outline attached Exudate Amount: Large Exudate Type: Serosanguineous Exudate Color: red, brown t Structures Foul Odor After Cleansing: No Slough/Fibrino Yes Wound Bed  Granulation Amount: Large (67-100%) Exposed Structure Granulation Quality: Red, Pink Fascia Exposed: No Necrotic Amount: Small (1-33%) Fat Layer (Subcutaneous Tissue) Exposed:  Yes Necrotic Quality: Adherent Slough Tendon Exposed: No Muscle Exposed: No Joint Exposed: No Bone Exposed: No Periwound Skin Texture Texture Color No Abnormalities Noted: No No Abnormalities Noted: No Callus: No Atrophie Blanche: No Crepitus: No Cyanosis: No Excoriation: No Ecchymosis: No Induration: No Erythema: No Rash: No Hemosiderin Staining: No Scarring: No Mottled: No Pallor: No Moisture Rubor: No No Abnormalities Noted: No Dry / Scaly: No Temperature / Pain [name], [name] ([id number]) [id number] Page 6 of 8 Maceration: Yes Temperature: No Abnormality Treatment Notes Wound #12 (Lower Leg) Wound Laterality: Left, Anterior Cleanser Peri-Wound Care Triamcinolone 15 (g) Discharge Instruction: Use triamcinolone 15 (g) as directed Sween Lotion (Moisturizing lotion) Discharge Instruction: Apply moisturizing lotion as directed Topical Primary Dressing KerraCel Ag Gelling Fiber Dressing, 2x2 in (silver alginate) Discharge Instruction: Apply silver alginate to wound bed as instructed Secondary Dressing Optifoam Non-Adhesive Dressing, 4x4 in Discharge Instruction: [place]. Woven Gauze Sponge, Non-Sterile 4x4 in Discharge Instruction: Apply over primary dressing as directed. Secured With Compression Wrap ThreePress (3 layer compression wrap) Discharge Instruction: Apply three layer use kerlix instead of cotton compression as directed. Compression Stockings Add-Ons Electronic Signature(s) Signed: 10/20/2021 4:39:54 PM By: [name] Entered By: [name] on 10/20/2021 16:06:53 -------------------------------------------------------------------------------- Wound Assessment Details Patient Name: Date of Service: [name], [name] [name]. 10/20/2021 3:00 PM Medical Record Number: [id number] Patient Account Number: [id number] Date of Birth/Sex: Treating [name]: 08-Feb-1955 (66 y.o. M) Primary Care Marlyne Totaro:  [name] Other Clinician: Referring Argyle Gustafson: Treating Karolyne Timmons/Extender: [name] in Treatment: 11 Wound Status Wound Number: 7 Primary Venous Leg Ulcer Etiology: Wound Location: Right, Medial Malleolus Secondary Lymphedema Wounding Event: Blister Etiology: Date Acquired: 07/21/2021 Wound Open Weeks Of Treatment: 11 Status: Clustered Wound: No Comorbid Sleep Apnea, Arrhythmia, Congestive Heart Failure, History: Hypertension, Hypotension, Peripheral Arterial Disease, Peripheral Venous Disease, Type II Diabetes, Gout, Osteoarthritis Photos [name], EAVES [name] ([id number]) [id number] Page 7 of 8 Wound Measurements Length: (cm) 4 Width: (cm) 5 Depth: (cm) 0.2 Area: (cm) 15.708 Volume: (cm) 3.142 % Reduction in Area: 59% % Reduction in Volume: 88.3% Epithelialization: Medium (34-66%) Tunneling: No Undermining: No Wound Description Classification: Full Thickness With Exposed Suppo Wound Margin: Distinct, outline attached Exudate Amount: Medium Exudate Type: Serosanguineous Exudate Color: red, brown rt Structures Foul Odor After Cleansing: No Slough/Fibrino Yes Wound Bed Granulation Amount: Large (67-100%) Exposed Structure Granulation Quality: Red Fascia Exposed: No Necrotic Amount: Small (1-33%) Fat Layer (Subcutaneous Tissue) Exposed: Yes Necrotic Quality: Adherent Slough Tendon Exposed: No Muscle Exposed: No Joint Exposed: No Bone Exposed: No Periwound Skin Texture Texture Color No Abnormalities Noted: No No Abnormalities Noted: No Callus: No Atrophie Blanche: No Crepitus: No Cyanosis: No Excoriation: No Ecchymosis: No Induration: No Erythema: No Rash: No Hemosiderin Staining: No Scarring: No Mottled: No Pallor: No Moisture Rubor: No No Abnormalities Noted: No Dry / Scaly: No Temperature / Pain Maceration: No Temperature: No Abnormality Treatment Notes Wound #7 (Malleolus) Wound Laterality:  Right, Medial Cleanser Peri-Wound Care Triamcinolone 15 (g) Discharge Instruction: Use triamcinolone 15 (g) as directed Sween Lotion (Moisturizing lotion) Discharge Instruction: Apply moisturizing lotion as directed Topical [place]stone topical compounding antibiotics Discharge Instruction: **HOLD FOR THIS WEEK**apply directly to wound bed. Primary Dressing Promogran Prisma Matrix, 4.34 (sq in) (silver collagen) Discharge Instruction: Moisten collagen with saline or hydrogel Secondary Dressing ABD Pad, 8x10 Discharge Instruction: Apply over  primary dressing as directed. MERRICK, MAGGIO ([id number]) [id number] Page 8 of 8 Drawtex 4x4 in Discharge Instruction: Apply over primary dressing as directed. Secured With Compression Wrap ThreePress (3 layer compression wrap) Discharge Instruction: Apply three layer use kerlix instead of cotton compression as directed. Compression Stockings Add-Ons Electronic Signature(s) Signed: 10/20/2021 4:39:54 PM By: [name] Entered By: [name] on 10/20/2021 16:09:00 -------------------------------------------------------------------------------- Vitals Details Patient Name: Date of Service: Cominsky, [name] [name]. 10/20/2021 3:00 PM Medical Record Number: [id number] Patient Account Number: [id number] Date of Birth/Sex: Treating [name]: Jun 06, 1955 (66 y.o. M) Primary Care Romond Pipkins: [name] Other Clinician: Referring Teniyah Seivert: Treating Daneesha Quinteros/Extender: [name] in Treatment: 11 Vital Signs Time Taken: 15:35 Temperature (F): 97.9 Height (in): 71 Pulse (bpm): 86 Weight (lbs): 350 Respiratory Rate (breaths/min): 19 Body Mass Index (BMI): 48.8 Blood Pressure (mmHg): 133/87 Reference Range: 80 - 120 mg / dl Electronic Signature(s) Signed: 10/20/2021 4:39:54 PM By: [name] Entered By: [name] on 10/20/2021 15:36:17

## 2021-10-20 NOTE — Progress Notes (Addendum)
TEON, HUDNALL ([id number]) [id number] Page 1 of 10 Visit Report for 10/20/2021 Chief Complaint Document Details Patient Name: Date of Service: [name], [name] 10/20/2021 3:00 PM Medical Record Number: [id number] Patient Account Number: [id number] Date of Birth/Sex: Treating RN: Sep 21, 1955 (66 y.o. M) Primary Care Provider: [name] Other Clinician: Referring Provider: Treating Provider/Extender: [name] in Treatment: 11 Information Obtained from: Patient Chief Complaint 08/04/2021; patient returns to clinic with bilateral leg wounds as well as areas on the right foot Electronic Signature(s) Signed: 10/20/2021 3:08:19 PM By: [name] PA-C Entered By: [name] on 10/20/2021 15:08:19 -------------------------------------------------------------------------------- Debridement Details Patient Name: Date of Service: [name], [name] 10/20/2021 3:00 PM Medical Record Number: [id number] Patient Account Number: [id number] Date of Birth/Sex: Treating RN: 08/01/1955 (66 y.o. M) Primary Care Provider: [name] Other Clinician: Referring Provider: Treating Provider/Extender: [name] in Treatment: 11 Debridement Performed for Assessment: Wound #7 Right,Medial Malleolus Performed By: Physician [name], PA Debridement Type: Debridement Severity of Tissue Pre Debridement: Fat layer exposed Level of Consciousness (Pre-procedure): Awake and Alert Pre-procedure Verification/Time Out Yes - 16:25 Taken: Start Time: 16:26 Pain Control: Lidocaine 5% topical ointment T Area Debrided (L x W): otal 4 (cm) x 5 (cm) = 20 (cm) Tissue and other material debrided: Viable, Non-Viable, Slough, Subcutaneous, Skin: Dermis , Skin: Epidermis, Slough Level: Skin/Subcutaneous Tissue Debridement Description: Excisional Instrument: Curette Bleeding: Minimum Hemostasis Achieved: Pressure End Time:  16:33 Procedural Pain: 0 Post Procedural Pain: 0 Response to Treatment: Procedure was tolerated well Level of Consciousness (Post- Awake and Alert procedure): Post Debridement Measurements of Total Wound Length: (cm) 4 Width: (cm) 5 Depth: (cm) 0.2 Volume: (cm) 3.142 Character of Wound/Ulcer Post Debridement: Improved Severity of Tissue Post Debridement: Fat layer exposed [name], [name] ([id number]) [id number] Page 2 of 10 Post Procedure Diagnosis Same as Pre-procedure Electronic Signature(s) Signed: 10/20/2021 5:09:22 PM By: [name] PA-C Entered By: [name] on 10/20/2021 17:09:22 -------------------------------------------------------------------------------- HPI Details Patient Name: Date of Service: [name], [name] 10/20/2021 3:00 PM Medical Record Number: [id number] Patient Account Number: [id number] Date of Birth/Sex: Treating RN: 09-Apr-1955 (66 y.o. M) Primary Care Provider: [name] Other Clinician: Referring Provider: Treating Provider/Extender: [name] in Treatment: 11 History of Present Illness HPI Description: ADMISSION 03/22/2021 This is a 66 year old man with a past medical history significant for diabetes type 2, congestive heart failure, peripheral arterial disease, morbid obesity, venous insufficiency, and coronary artery disease. He has been followed by [name] in podiatry, who performed a transmetatarsal amputation on the left foot in August 2022. He had issues healing that wound, but based upon Dr. [name] notes, ultimately the TMA wound healed. During his recovery from that surgery, however, ulcers opened up over the DIP joint of the right second and third toe. These have apparently closed and reopened multiple times. It sounds like one of the issues has been moisture accumulation and maceration of the tissues causing them to reopen. At his last visit with [name], on  March 01, 2021, there continues to be problems with moisture and he was referred to wound care for further evaluation and management. He had a formal aortogram with runoff performed prior to his TMA. The findings are copied here: Patient has inline flow to both feet with no significant flow-limiting lesion that would be amenable to percutaneous or open revascularization. He does have an element of small vessel disease and has  a short segment occlusion of the distal anterior tibial/dorsalis pedis artery on the left foot but does have posterior tibial artery flow. Would recommend management of wounds with amputation of toes 2 and 3 on the right foot if the wounds do not heal and deteriorate. Transmetatarsal amputation on the left side has as good a blood supply as it is going to get and hopefully this will heal in the future. Formal ABIs were done in January 2023. They are normal bilaterally. ABI Findings: +---------+------------------+-----+----------+--------+ Right Rt Pressure (mmHg)IndexWaveform Comment  +---------+------------------+-----+----------+--------+ Brachial 160     +---------+------------------+-----+----------+--------+ PTA 192 1.06 monophasic  +---------+------------------+-----+----------+--------+ DP 159 0.88 monophasic  +---------+------------------+-----+----------+--------+ Great T oe145 0.80    +---------+------------------+-----+----------+--------+ +--------+------------------+-----+---------+-------+ Left Lt Pressure (mmHg)IndexWaveform Comment +--------+------------------+-----+---------+-------+ [id number]     +--------+------------------+-----+---------+-------+ PTA 204 1.13 triphasic  +--------+------------------+-----+---------+-------+ DP 194 1.07 biphasic   +--------+------------------+-----+---------+-------+ +-------+-----------+-----------+------------+------------+ ABI/TBIT oday's ABIT  oday's TBIPrevious ABIPrevious TBI +-------+-----------+-----------+------------+------------+ Right 1.06 0.8 1.26 0.65  +-------+-----------+-----------+------------+------------+ Left 1.13 amputation 1.15 amputation  +-------+-----------+-----------+------------+------------+ Previous ABI on 08/06/20 at [facility] Pedal pressures falsely elevated due to medial calcification. SummaryBIRAN, MAYBERRY ([id number]) [id number] Page 3 of 10 Right: Resting right ankle-brachial index is within normal range. The right toe-brachial index is normal. Left: Resting left ankle-brachial index is within normal range. READMISSION 08/04/2021 Mr. Miera is now a 66 year old man who I remember from this clinic many years ago I think he had a right lower extremity predominantly venous wound at the time. He was here for 1 visit in March of this year had wounds on his right second and third toes we apparently dressed them many and they healed so he did not come back. He is listed in [place] is being a diabetic although the patient denies this says he is verified it with his primary doctor. In any case over the last several weeks or so according the patient although these wounds look somewhat more chronic than that he has developed predominantly large wounds on the right medial and right lateral ankle smaller areas on the left leg and areas on the dorsal aspect of his right second and third toes. Its not clear how he has been dressing these. 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 The patient has known PAD. He had an angiogram in August 2022 at which time he had nonhealing wounds in both feet. On the left his major vessels in the thigh were all patent. He had three-vessel patent to the level of the ankle. He had a very short occlusion in the left anterior tibial. On the right lower  extremity the major vessels in his thigh were all patent. He had three-vessel runoff to the foot sluggish filling of the anterior tibial artery. He was felt to have some component of small vessel disease but nothing that was amenable or needed revascularization. It was recommended that he have amputation of the second and third toes on the right foot if they did not heal He has been following with [name] of podiatry. [name] got him juxta lite stockings although I do not think he had them on properly he has uncontrolled edema in both legs Past medical history includes type 2 diabetes [although the patient really denies this], left TMA in 2022,lower extremity wounds in fact attendance at this clinic in 2009-2010, A-fib on Eliquis, chronic venous insufficiency with secondary lymphedema history of non-Hodgkin's lymphoma. 08-11-2021 upon evaluation today patient presents for follow-up  evaluation he was seen last Wednesday initially for inspection here in our clinic. With that being said he tells me that he unfortunately has been having a lot of drainage and is actually coming through his wrap. Fortunately I do not see any evidence of active infection locally or systemically at this time which is great news. No fevers, chills, nausea, vomiting, or diarrhea. With that being said there does appear to be some evidence of local infection based on what I am seeing today. 08-18-2021 upon evaluation today patient appears to be doing okay currently in regard to his wounds with that being said that he is doing much better but still has a long ways to go to get to where he wanted to be. I think the infection is significantly improved. He has another week of the antibiotic at this point. 08-25-2021 upon evaluation today patient's wounds are actually doing decently well he has erythema has significantly improved. I think the cellulitis is under controlled I am going to continue him on 2 more weeks of the Levaquin at  500 mg this is a lower dose but I am hoping it will be better for him. 09-08-2021 upon evaluation today patient appears to be doing excellent in regard to his wounds. Since I last saw him he was actually in the hospital where he ended up having a pacemaker put in. Subsequently he tells me that he is actually doing quite well although they were unsure whether they were going to do it due to the fact that he had the wounds on his legs. And then I am glad they did anything seem to be doing well. 09-15-2021 upon evaluation patient's wounds are actually showing signs of improvement. The right side wounds do appear to have some need for sharp debridement today and I Georgina Peer go ahead and proceed with that. I think that if we get the wounds cleaned up he will actually show signs of continued improvement. I am also leaning towards switching to [facility] which I think will be a much better option for him. 09-22-2021 patient's wounds are showing signs of excellent improvement. I am actually extremely pleased with where we stand and I think that the patient is making great progress. There does not appear to be any signs of active infection. 09-29-2021 upon evaluation today patient appears to be doing excellent in regard to his wounds. He is actually tolerating the dressing changes without complication. Fortunately I see no evidence of active infection locally or systemically at this time which is great news and overall I am extremely pleased with where we stand currently. 09-2721 upon evaluation today patient actually appears to be doing excellent in regard to his wounds. The left leg is almost completely healed the right ankle is significantly smaller. Overall I am extremely pleased with where we stand at this point. I do not see any evidence of active infection at this time. 10-13-2021 upon evaluation today patient appears to be doing excellent in regard to his wounds. I really feel like he is making good progress  here and I am very pleased in that regard. Fortunately I do not see any signs of active infection at this time. We are using the [facility] topical antibiotic therapy. 10-20-2021 upon evaluation today patient appears to be doing well with regard to his wound on the right medial ankle region the left leg is almost completely healed. I am actually very pleased with where we stand today. Electronic Signature(s) Signed: 10/20/2021 5:01:46 PM By: Joaquim Lai  III, Levi Crass PA-C Entered By: [name] on 10/20/2021 17:01:46 -------------------------------------------------------------------------------- Physical Exam Details Patient Name: Date of Service: [name], [name] 10/20/2021 3:00 PM Medical Record Number: [id number] Patient Account Number: [id number] Date of Birth/Sex: Treating RN: October 24, 1955 (66 y.o. M) Primary Care Provider: [name] Other Clinician: Referring Provider: Treating Provider/Extender: [name] in Treatment: [address] BAUMGARDNER, [name] ([id number]) [id number] Page 4 of 10 Well-nourished and well-hydrated in no acute distress. Respiratory normal breathing without difficulty. Psychiatric this patient is able to make decisions and demonstrates good insight into disease process. Alert and Oriented x 3. pleasant and cooperative. Notes Upon inspection patient's wound did require some sharp debridement with some necrotic tissue noted I did perform debridement today to clearway some of the necrotic debris he tolerated that today without complication postdebridement the wound bed appears to be doing significantly better which is great news. Electronic Signature(s) Signed: 10/20/2021 5:02:04 PM By: [name] PA-C Entered By: [name] on 10/20/2021 17:02:04 -------------------------------------------------------------------------------- Physician Orders Details Patient Name: Date of Service: [name], [name]  10/20/2021 3:00 PM Medical Record Number: [id number] Patient Account Number: [id number] Date of Birth/Sex: Treating RN: 02-11-1955 (66 y.o. [name], [name] Primary Care Provider: [name] Other Clinician: Referring Provider: Treating Provider/Extender: [name] in Treatment: [phone] Verbal / Phone Orders: No Diagnosis Coding ICD-10 Coding Code Description I89.0 Lymphedema, not elsewhere classified I87.333 Chronic venous hypertension (idiopathic) with ulcer and inflammation of bilateral lower extremity L97.828 Non-pressure chronic ulcer of other part of left lower leg with other specified severity L97.818 Non-pressure chronic ulcer of other part of right lower leg with other specified severity E11.621 Type 2 diabetes mellitus with foot ulcer L97.518 Non-pressure chronic ulcer of other part of right foot with other specified severity Follow-up Appointments ppointment in 2 weeks. Margarita Grizzle and [place], Room 8 [facility] 11/03/2021 Return A Nurse Visit: - 10/27/2021 Wednesday nurse visit Other: - [place] topical compounding antibiotics- bring to your appointments. bring in compression stockings to weekly appts. Anesthetic (In clinic) Topical Lidocaine 5% applied to wound bed Bathing/ Shower/ Hygiene May shower with protection but do not get wound dressing(s) wet. Edema Control - Lymphedema / SCD / Other Elevate legs to the level of the heart or above for 30 minutes daily and/or when sitting, a frequency of: - 3-4 times a day throughout the day. Avoid standing for long periods of time. Exercise regularly Wound Treatment Wound #12 - Lower Leg Wound Laterality: Left, Anterior Peri-Wound Care: Triamcinolone 15 (g) 1 x Per Week/30 Days Discharge Instructions: Use triamcinolone 15 (g) as directed Peri-Wound Care: Sween Lotion (Moisturizing lotion) 1 x Per Week/30 Days Discharge Instructions: Apply moisturizing lotion as directed Prim Dressing: KerraCel Ag  Gelling Fiber Dressing, 2x2 in (silver alginate) 1 x Per Week/30 Days ary Discharge Instructions: Apply silver alginate to wound bed as instructed Secondary Dressing: Optifoam Non-Adhesive Dressing, 4x4 in 1 x Per Week/30 Days Discharge Instructions: Apply TO LEFT ANTERIOR ANKLE TO PROTECT ANKLE. EDAN, JUDAY ([id number]) [id number] Page 5 of 10 Secondary Dressing: Woven Gauze Sponge, Non-Sterile 4x4 in 1 x Per Week/30 Days Discharge Instructions: Apply over primary dressing as directed. Compression Wrap: ThreePress (3 layer compression wrap) 1 x Per Week/30 Days Discharge Instructions: Apply three layer use kerlix instead of cotton compression as directed. Wound #7 - Malleolus Wound Laterality: Right, Medial Peri-Wound Care: Triamcinolone 15 (g) 1 x Per Week/30 Days Discharge Instructions: Use triamcinolone 15 (g) as directed  Peri-Wound Care: Sween Lotion (Moisturizing lotion) 1 x Per Week/30 Days Discharge Instructions: Apply moisturizing lotion as directed Topical: [facility] topical compounding antibiotics 1 x Per Week/30 Days Discharge Instructions: **HOLD FOR THIS WEEK**apply directly to wound bed. Prim Dressing: Promogran Prisma Matrix, 4.34 (sq in) (silver collagen) 1 x Per Week/30 Days ary Discharge Instructions: Moisten collagen with saline or hydrogel Secondary Dressing: ABD Pad, 8x10 1 x Per Week/30 Days Discharge Instructions: Apply over primary dressing as directed. Compression Wrap: ThreePress (3 layer compression wrap) 1 x Per Week/30 Days Discharge Instructions: Apply three layer use kerlix instead of cotton compression as directed. Electronic Signature(s) Signed: 10/20/2021 5:12:24 PM By: [name] PA-C Entered By: [name] on 10/20/2021 17:09:40 -------------------------------------------------------------------------------- Problem List Details Patient Name: Date of Service: [name], [name] 10/20/2021 3:00 PM Medical Record  Number: [id number] Patient Account Number: [id number] Date of Birth/Sex: Treating RN: 10/01/1955 (66 y.o. M) Primary Care Provider: [name] Other Clinician: Referring Provider: Treating Provider/Extender: [name] in Treatment: 11 Active Problems ICD-10 Encounter Code Description Active Date MDM Diagnosis I89.0 Lymphedema, not elsewhere classified 08/04/2021 No Yes I87.333 Chronic venous hypertension (idiopathic) with ulcer and inflammation of 08/04/2021 No Yes bilateral lower extremity L97.828 Non-pressure chronic ulcer of other part of left lower leg with other specified 08/04/2021 No Yes severity L97.818 Non-pressure chronic ulcer of other part of right lower leg with other specified 08/04/2021 No Yes severity E11.621 Type 2 diabetes mellitus with foot ulcer 08/04/2021 No Yes Sponsel, [name] ([id number]) [id number] Page 6 of 10 L97.518 Non-pressure chronic ulcer of other part of right foot with other specified 08/04/2021 No Yes severity Inactive Problems Resolved Problems Electronic Signature(s) Signed: 10/20/2021 3:08:11 PM By: [name] PA-C Entered By: [name] on 10/20/2021 15:08:11 -------------------------------------------------------------------------------- Progress Note Details Patient Name: Date of Service: [name], [name] 10/20/2021 3:00 PM Medical Record Number: [id number] Patient Account Number: [id number] Date of Birth/Sex: Treating RN: 26-Jul-1955 (66 y.o. M) Primary Care Provider: [name] Other Clinician: Referring Provider: Treating Provider/Extender: [name] in Treatment: 11 Subjective Chief Complaint Information obtained from Patient 08/04/2021; patient returns to clinic with bilateral leg wounds as well as areas on the right foot History of Present Illness (HPI) ADMISSION 03/22/2021 This is a 66 year old man with a past medical history significant for  diabetes type 2, congestive heart failure, peripheral arterial disease, morbid obesity, venous insufficiency, and coronary artery disease. He has been followed by [name] in podiatry, who performed a transmetatarsal amputation on the left foot in August 2022. He had issues healing that wound, but based upon Dr. [name] notes, ultimately the TMA wound healed. During his recovery from that surgery, however, ulcers opened up over the DIP joint of the right second and third toe. These have apparently closed and reopened multiple times. It sounds like one of the issues has been moisture accumulation and maceration of the tissues causing them to reopen. At his last visit with [name], on March 01, 2021, there continues to be problems with moisture and he was referred to wound care for further evaluation and management. He had a formal aortogram with runoff performed prior to his TMA. The findings are copied here: Patient has inline flow to both feet with no significant flow-limiting lesion that would be amenable to percutaneous or open revascularization. He does have an element of small vessel disease and has a short segment occlusion of the distal anterior tibial/dorsalis pedis artery on  the left foot but does have posterior tibial artery flow. Would recommend management of wounds with amputation of toes 2 and 3 on the right foot if the wounds do not heal and deteriorate. Transmetatarsal amputation on the left side has as good a blood supply as it is going to get and hopefully this will heal in the future. Formal ABIs were done in January 2023. They are normal bilaterally. ABI Findings: +---------+------------------+-----+----------+--------+ Right Rt Pressure (mmHg)IndexWaveform Comment  +---------+------------------+-----+----------+--------+ Brachial 160    +---------+------------------+-----+----------+--------+ PTA 192 1.06 monophasic   +---------+------------------+-----+----------+--------+ DP 159 0.88 monophasic  +---------+------------------+-----+----------+--------+ Great T oe145 0.80    +---------+------------------+-----+----------+--------+ +--------+------------------+-----+---------+-------+ Left Lt Pressure (mmHg)IndexWaveform Comment +--------+------------------+-----+---------+-------+ [id number]    +--------+------------------+-----+---------+-------+ PTA 204 1.13 triphasic  +--------+------------------+-----+---------+-------+ DP 194 1.07 biphasic   +--------+------------------+-----+---------+-------+ +-------+-----------+-----------+------------+------------+ ABI/TBIT oday's ABIT oday's TBIPrevious ABIPrevious TBI [name], [name] ([id number]) [id number] Page 7 of 10 +-------+-----------+-----------+------------+------------+ Right 1.06 0.8 1.26 0.65  +-------+-----------+-----------+------------+------------+ Left 1.13 amputation 1.15 amputation  +-------+-----------+-----------+------------+------------+ Previous ABI on 08/06/20 at [facility] pressures falsely elevated due to medial calcification. Summary: Right: Resting right ankle-brachial index is within normal range. The right toe-brachial index is normal. Left: Resting left ankle-brachial index is within normal range. READMISSION 08/04/2021 Mr. Oshita is now a 66 year old man who I remember from this clinic many years ago I think he had a right lower extremity predominantly venous wound at the time. He was here for 1 visit in March of this year had wounds on his right second and third toes we apparently dressed them many and they healed so he did not come back. He is listed in [place] is being a diabetic although the patient denies this says he is verified it with his primary doctor. In any case over the last several weeks or so according the patient  although these wounds look somewhat more chronic than that he has developed predominantly large wounds on the right medial and right lateral ankle smaller areas on the left leg and areas on the dorsal aspect of his right second and third toes. Its not clear how he has been dressing these. 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 The patient has known PAD. He had an angiogram in August 2022 at which time he had nonhealing wounds in both feet. On the left his major vessels in the thigh were all patent. He had three-vessel patent to the level of the ankle. He had a very short occlusion in the left anterior tibial. On the right lower extremity the major vessels in his thigh were all patent. He had three-vessel runoff to the foot sluggish filling of the anterior tibial artery. He was felt to have some component of small vessel disease but nothing that was amenable or needed revascularization. It was recommended that he have amputation of the second and third toes on the right foot if they did not heal He has been following with [name] of podiatry. [name] got him juxta lite stockings although I do not think he had them on properly he has uncontrolled edema in both legs Past medical history includes type 2 diabetes [although the patient really denies this], left TMA in 2022,lower extremity wounds in fact attendance at this clinic in 2009-2010, A-fib on Eliquis, chronic venous insufficiency with secondary lymphedema history of non-Hodgkin's lymphoma. 08-11-2021 upon evaluation today patient presents for follow-up evaluation he was seen last Wednesday initially for inspection here in our clinic. With  that being said he tells me that he unfortunately has been having a lot of drainage and is actually coming through his wrap. Fortunately I do not see any evidence of active infection locally or systemically at this time which is  great news. No fevers, chills, nausea, vomiting, or diarrhea. With that being said there does appear to be some evidence of local infection based on what I am seeing today. 08-18-2021 upon evaluation today patient appears to be doing okay currently in regard to his wounds with that being said that he is doing much better but still has a long ways to go to get to where he wanted to be. I think the infection is significantly improved. He has another week of the antibiotic at this point. 08-25-2021 upon evaluation today patient's wounds are actually doing decently well he has erythema has significantly improved. I think the cellulitis is under controlled I am going to continue him on 2 more weeks of the Levaquin at 500 mg this is a lower dose but I am hoping it will be better for him. 09-08-2021 upon evaluation today patient appears to be doing excellent in regard to his wounds. Since I last saw him he was actually in the hospital where he ended up having a pacemaker put in. Subsequently he tells me that he is actually doing quite well although they were unsure whether they were going to do it due to the fact that he had the wounds on his legs. And then I am glad they did anything seem to be doing well. 09-15-2021 upon evaluation patient's wounds are actually showing signs of improvement. The right side wounds do appear to have some need for sharp debridement today and I Georgina Peer go ahead and proceed with that. I think that if we get the wounds cleaned up he will actually show signs of continued improvement. I am also leaning towards switching to [facility] which I think will be a much better option for him. 09-22-2021 patient's wounds are showing signs of excellent improvement. I am actually extremely pleased with where we stand and I think that the patient is making great progress. There does not appear to be any signs of active infection. 09-29-2021 upon evaluation today patient appears to be doing excellent  in regard to his wounds. He is actually tolerating the dressing changes without complication. Fortunately I see no evidence of active infection locally or systemically at this time which is great news and overall I am extremely pleased with where we stand currently. 09-2721 upon evaluation today patient actually appears to be doing excellent in regard to his wounds. The left leg is almost completely healed the right ankle is significantly smaller. Overall I am extremely pleased with where we stand at this point. I do not see any evidence of active infection at this time. 10-13-2021 upon evaluation today patient appears to be doing excellent in regard to his wounds. I really feel like he is making good progress here and I am very pleased in that regard. Fortunately I do not see any signs of active infection at this time. We are using the [facility] topical antibiotic therapy. 10-20-2021 upon evaluation today patient appears to be doing well with regard to his wound on the right medial ankle region the left leg is almost completely healed. I am actually very pleased with where we stand today. Objective Constitutional Well-nourished and well-hydrated in no acute distress. Vitals Time Taken: 3:35 PM, Height: 71 in, Weight: 350 lbs, BMI: 48.8,  Temperature: 97.9 F, Pulse: 86 bpm, Respiratory Rate: 19 breaths/min, Blood Pressure: 133/87 mmHg. [name], LAKER ([id number]) [id number] Page 8 of 10 Respiratory normal breathing without difficulty. Psychiatric this patient is able to make decisions and demonstrates good insight into disease process. Alert and Oriented x 3. pleasant and cooperative. General Notes: Upon inspection patient's wound did require some sharp debridement with some necrotic tissue noted I did perform debridement today to clearway some of the necrotic debris he tolerated that today without complication postdebridement the wound bed appears to be doing  significantly better which is great news. Integumentary (Hair, Skin) Wound #12 status is Open. Original cause of wound was Gradually Appeared. The date acquired was: 07/15/2021. The wound has been in treatment 11 weeks. The wound is located on the Left,Anterior Lower Leg. The wound measures 1.5cm length x 2.5cm width x 0.1cm depth; 2.945cm^2 area and 0.295cm^3 volume. There is Fat Layer (Subcutaneous Tissue) exposed. There is no tunneling or undermining noted. There is a large amount of serosanguineous drainage noted. The wound margin is distinct with the outline attached to the wound base. There is large (67-100%) red, pink granulation within the wound bed. There is a small (1-33%) amount of necrotic tissue within the wound bed including Adherent Slough. The periwound skin appearance exhibited: Maceration. The periwound skin appearance did not exhibit: Callus, Crepitus, Excoriation, Induration, Rash, Scarring, Dry/Scaly, Atrophie Blanche, Cyanosis, Ecchymosis, Hemosiderin Staining, Mottled, Pallor, Rubor, Erythema. Periwound temperature was noted as No Abnormality. Wound #7 status is Open. Original cause of wound was Blister. The date acquired was: 07/21/2021. The wound has been in treatment 11 weeks. The wound is located on the Right,Medial Malleolus. The wound measures 4cm length x 5cm width x 0.2cm depth; 15.708cm^2 area and 3.142cm^3 volume. There is Fat Layer (Subcutaneous Tissue) exposed. There is no tunneling or undermining noted. There is a medium amount of serosanguineous drainage noted. The wound margin is distinct with the outline attached to the wound base. There is large (67-100%) red granulation within the wound bed. There is a small (1-33%) amount of necrotic tissue within the wound bed including Adherent Slough. The periwound skin appearance did not exhibit: Callus, Crepitus, Excoriation, Induration, Rash, Scarring, Dry/Scaly, Maceration, Atrophie Blanche, Cyanosis, Ecchymosis,  Hemosiderin Staining, Mottled, Pallor, Rubor, Erythema. Periwound temperature was noted as No Abnormality. 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 There was a Excisional Skin/Subcutaneous Tissue Debridement with a total area of 20 sq cm performed by [name], PA. With the following instrument(s): Curette to remove Viable and Non-Viable tissue/material. Material removed includes Subcutaneous Tissue, Slough, Skin: Dermis, and Skin: Epidermis after achieving pain control using Lidocaine 5% topical ointment. A time out was conducted at 16:25, prior to the start of the procedure. A Minimum amount of bleeding was controlled with Pressure. The procedure was tolerated well with a pain level of 0 throughout and a pain level of 0 following the procedure. Post Debridement Measurements: 4cm length x 5cm width x 0.2cm depth; 3.142cm^3 volume.  Character of Wound/Ulcer Post Debridement is improved. Severity of Tissue Post Debridement is: Fat layer exposed. Post procedure Diagnosis Wound #7: Same as Pre-Procedure Pre-procedure diagnosis of Wound #7 is a Venous Leg Ulcer located on the Right,Medial Malleolus . There was a Three Layer Compression Therapy Procedure by Deon Pilling, RN. Post procedure Diagnosis Wound #7: Same as Pre-Procedure Wound #12 Pre-procedure diagnosis of Wound #12 is a Venous Leg  Ulcer located on the Left,Anterior Lower Leg . There was a Three Layer Compression Therapy Procedure by Deon Pilling, RN. Post procedure Diagnosis Wound #12: Same as Pre-Procedure Plan Follow-up Appointments: Return Appointment in 2 weeks. Margarita Grizzle and [place], Room 8 [facility] 11/03/2021 Nurse Visit: - 10/27/2021 Wednesday nurse visit Other: - [facility] topical compounding antibiotics- bring to your appointments. bring in compression stockings to weekly appts. Anesthetic: (In clinic) Topical Lidocaine 5% applied to wound bed Bathing/ Shower/ Hygiene: May shower with protection but do not get wound dressing(s) wet. BENARD, MINTURN ([id number]) [id number] Page 9 of 10 Edema Control - Lymphedema / SCD / Other: Elevate legs to the level of the heart or above for 30 minutes daily and/or when sitting, a frequency of: - 3-4 times a day throughout the day. Avoid standing for long periods of time. Exercise regularly WOUND #12: - Lower Leg Wound Laterality: Left, Anterior Peri-Wound Care: Triamcinolone 15 (g) 1 x Per Week/30 Days Discharge Instructions: Use triamcinolone 15 (g) as directed Peri-Wound Care: Sween Lotion (Moisturizing lotion) 1 x Per Week/30 Days Discharge Instructions: Apply moisturizing lotion as directed Prim Dressing: KerraCel Ag Gelling Fiber Dressing, 2x2 in (silver alginate) 1 x Per Week/30 Days ary Discharge Instructions: Apply silver alginate to wound bed as instructed Secondary Dressing: Optifoam Non-Adhesive Dressing, 4x4 in 1 x Per Week/30 Days Discharge Instructions: Apply TO LEFT ANTERIOR ANKLE TO PROTECT ANKLE. Secondary Dressing: Woven Gauze Sponge, Non-Sterile 4x4 in 1 x Per Week/30 Days Discharge Instructions: Apply over primary dressing as directed. Com pression Wrap: ThreePress (3 layer compression wrap) 1 x Per Week/30 Days Discharge Instructions: Apply three layer use kerlix instead of cotton compression as directed. WOUND #7:  - Malleolus Wound Laterality: Right, Medial Peri-Wound Care: Triamcinolone 15 (g) 1 x Per Week/30 Days Discharge Instructions: Use triamcinolone 15 (g) as directed Peri-Wound Care: Sween Lotion (Moisturizing lotion) 1 x Per Week/30 Days Discharge Instructions: Apply moisturizing lotion as directed Topical: [facility] topical compounding antibiotics 1 x Per Week/30 Days Discharge Instructions: **HOLD FOR THIS WEEK**apply directly to wound bed. Prim Dressing: Promogran Prisma Matrix, 4.34 (sq in) (silver collagen) 1 x Per Week/30 Days ary Discharge Instructions: Moisten collagen with saline or hydrogel Secondary Dressing: ABD Pad, 8x10 1 x Per Week/30 Days Discharge Instructions: Apply over primary dressing as directed. Com pression Wrap: ThreePress (3 layer compression wrap) 1 x Per Week/30 Days Discharge Instructions: Apply three layer use kerlix instead of cotton compression as directed. 1. I am good recommend that we have the patient continue to monitor for any signs of worsening or infection. Right now I do feel like he is doing quite well with using the [facility] topical antibiotics. 2. I am going to suggest that we continue with the silver collagen which I think is also doing quite well. 3. We will continue with the ABD pad and 3 layer compression wrap which I feel like is doing quite well. We will see the patient back for follow-up visit in 1 week and this will be a nurse visit and then I will subsequently see him 2 weeks  for a repeat evaluation here in the clinic. Electronic Signature(s) Signed: 10/20/2021 5:10:02 PM By: [name] PA-C Previous Signature: 10/20/2021 5:08:41 PM Version By: [name] PA-C Previous Signature: 10/20/2021 5:02:44 PM Version By: [name] PA-C Entered By: [name] on 10/20/2021 17:10:02 -------------------------------------------------------------------------------- SuperBill Details Patient Name: Date of Service: Schindler, [name]  10/20/2021 Medical Record Number: [id number] Patient Account Number: [id number] Date of Birth/Sex: Treating RN: [birth year] (66 y.o. [name], [name] Primary Care Provider: [name] Other Clinician: Referring Provider: Treating Provider/Extender: [name] in Treatment: 11 Diagnosis Coding ICD-10 Codes Code Description I89.0 Lymphedema, not elsewhere classified I87.333 Chronic venous hypertension (idiopathic) with ulcer and inflammation of bilateral lower extremity L97.828 Non-pressure chronic ulcer of other part of left lower leg with other specified severity L97.818 Non-pressure chronic ulcer of other part of right lower leg with other specified severity E11.621 Type 2 diabetes mellitus with foot ulcer L97.518 Non-pressure chronic ulcer of other part of right foot with other specified severity Facility Procedures : [place], DAN 3 CPT4 Code: [place] [name] ([id number]) [id number] IC L Description: [id number] - DEB SUBQ TISSUE 20 SQ CM/< [name]-10 Diagnosis Description 97.818 Non-pressure chronic ulcer of other part of right lower leg with other specified severity Modifier: [id number] 1 Quantity: Page 10 of 10 : 3 CPT4 Code: 9030092 (Fa Description: cility Use Only) 29581LT - APPLY MULTLAY COMPRS LWR LT LEG Modifier: 59 1 Quantity: Physician Procedures : CPT4 Code Description Modifier [id number] - WC PHYS SUBQ TISS 20 SQ CM ICD-10 Diagnosis Description L97.818 Non-pressure chronic ulcer of other part of right lower leg with other specified severity Quantity: 1 Electronic Signature(s) Signed: 10/20/2021 5:10:15 PM By: [name] PA-C Entered By: [name] on 10/20/2021 17:10:15

## 2021-10-27 ENCOUNTER — Encounter (HOSPITAL_BASED_OUTPATIENT_CLINIC_OR_DEPARTMENT_OTHER): Payer: Medicare Other | Admitting: Internal Medicine

## 2021-10-27 DIAGNOSIS — Z6841 Body Mass Index (BMI) 40.0 and over, adult: Secondary | ICD-10-CM | POA: Diagnosis not present

## 2021-10-27 DIAGNOSIS — L97518 Non-pressure chronic ulcer of other part of right foot with other specified severity: Secondary | ICD-10-CM | POA: Diagnosis not present

## 2021-10-27 DIAGNOSIS — I87333 Chronic venous hypertension (idiopathic) with ulcer and inflammation of bilateral lower extremity: Secondary | ICD-10-CM | POA: Diagnosis not present

## 2021-10-27 DIAGNOSIS — I890 Lymphedema, not elsewhere classified: Secondary | ICD-10-CM | POA: Diagnosis not present

## 2021-10-27 DIAGNOSIS — E11621 Type 2 diabetes mellitus with foot ulcer: Secondary | ICD-10-CM | POA: Diagnosis not present

## 2021-10-27 DIAGNOSIS — Z89422 Acquired absence of other left toe(s): Secondary | ICD-10-CM | POA: Diagnosis not present

## 2021-10-27 DIAGNOSIS — L97828 Non-pressure chronic ulcer of other part of left lower leg with other specified severity: Secondary | ICD-10-CM | POA: Diagnosis not present

## 2021-10-27 DIAGNOSIS — I2510 Atherosclerotic heart disease of native coronary artery without angina pectoris: Secondary | ICD-10-CM | POA: Diagnosis not present

## 2021-10-27 DIAGNOSIS — L97818 Non-pressure chronic ulcer of other part of right lower leg with other specified severity: Secondary | ICD-10-CM | POA: Diagnosis not present

## 2021-10-27 NOTE — Progress Notes (Signed)
FLEETWOOD, PIERRON ([id number]) [id number] Page 1 of 1 Visit Report for 10/27/2021 SuperBill Details Patient Name: Date of Service: [name], [name] 10/27/2021 Medical Record Number: [id number] Patient Account Number: [id number] Date of Birth/Sex: Treating RN: [birth year] (65 y.o. M) Primary Care Provider: [name] Other Clinician: Erenest Blank Referring Provider: Treating Provider/Extender: Estelle Grumbles in Treatment: 12 Diagnosis Coding ICD-10 Codes Code Description I89.0 Lymphedema, not elsewhere classified I87.333 Chronic venous hypertension (idiopathic) with ulcer and inflammation of bilateral lower extremity L97.828 Non-pressure chronic ulcer of other part of left lower leg with other specified severity L97.818 Non-pressure chronic ulcer of other part of right lower leg with other specified severity E11.621 Type 2 diabetes mellitus with foot ulcer L97.518 Non-pressure chronic ulcer of other part of right foot with other specified severity Facility Procedures CPT4 Description Modifier Quantity Code [id number] BILATERAL: Application of multi-layer venous compression system; leg (below knee), including ankle and 1 foot. Electronic Signature(s) Signed: 10/27/2021 4:28:13 PM By: Linton Ham MD Signed: 10/27/2021 4:47:05 PM By: Erenest Blank Entered By: Erenest Blank on 10/27/2021 16:23:42

## 2021-10-27 NOTE — Progress Notes (Signed)
PAULINO, CORK ([id number]) [id number] Page 1 of 5 Visit Report for 10/27/2021 Arrival Information Details Patient Name: Date of Service: [name], [name] 10/27/2021 3:00 PM Medical Record Number: [id number] Patient Account Number: [id number] Date of Birth/Sex: Treating RN: [birth year] (66 y.o. M) Primary Care Savier Trickett: [name] Other Clinician: Referring Eddison Searls: Treating Cadee Agro/Extender: Estelle Grumbles in Treatment: 12 Visit Information History Since Last Visit Added or deleted any medications: No Patient Arrived: [name] Any new allergies or adverse reactions: No Arrival Time: 15:12 Had a fall or experienced change in No Accompanied By: self activities of daily living that may affect Transfer Assistance: None risk of falls: Patient Identification Verified: No Signs or symptoms of abuse/neglect since last visito No Secondary Verification Process Completed: No Hospitalized since last visit: No Patient Requires Transmission-Based Precautions: No Implantable device outside of the clinic excluding No Patient Has Alerts: Yes cellular tissue based products placed in the center Patient Alerts: Patient on Blood Thinner since last visit: 01/2021 ABI L 1.13 R 1.06 Has Compression in Place as Prescribed: Yes 01/2021 TBI L amp R 0.8 Pain Present Now: No Electronic Signature(s) Signed: 10/27/2021 4:47:05 PM By: Erenest Blank Entered By: Erenest Blank on 10/27/2021 15:12:43 -------------------------------------------------------------------------------- Compression Therapy Details Patient Name: Date of Service: [name]. 10/27/2021 3:00 PM Medical Record Number: [id number] Patient Account Number: [id number] Date of Birth/Sex: Treating RN: 06-Oct-1955 (66 y.o. M) Primary Care Brinlynn Gorton: [name] Other Clinician: Referring Endora Teresi: Treating Vue Pavon/Extender: Estelle Grumbles in Treatment: 12 Compression  Therapy Performed for Wound Assessment: Wound #12 Left,Anterior Lower Leg Performed By: Clinician Erenest Blank, Compression Type: Three Layer Electronic Signature(s) Signed: 10/27/2021 4:47:05 PM By: Erenest Blank Entered By: Erenest Blank on 10/27/2021 16:21:47 -------------------------------------------------------------------------------- Compression Therapy Details Patient Name: Date of Service: [name]. 10/27/2021 3:00 PM Medical Record Number: [id number] Patient Account Number: [id number] Date of Birth/Sex: Treating RN: 09/03/1955 (66 y.o. [name] ([id number]) [id number] Page 2 of 5 Primary Care Jihad Brownlow: [name] Other Clinician: Referring Vernetta Dizdarevic: Treating Elana Jian/Extender: Estelle Grumbles in Treatment: 12 Compression Therapy Performed for Wound Assessment: Wound #7 Right,Medial Malleolus Performed By: Clinician Erenest Blank, Compression Type: Three Layer Electronic Signature(s) Signed: 10/27/2021 4:47:05 PM By: Erenest Blank Entered By: Erenest Blank on 10/27/2021 16:21:47 -------------------------------------------------------------------------------- Encounter Discharge Information Details Patient Name: Date of Service: [name]. 10/27/2021 3:00 PM Medical Record Number: [id number] Patient Account Number: [id number] Date of Birth/Sex: Treating RN: December 30, 1955 (66 y.o. M) Primary Care Noor Vidales: [name] Other Clinician: Referring Juliah Scadden: Treating Anselm Aumiller/Extender: Estelle Grumbles in Treatment: 12 Encounter Discharge Information Items Discharge Condition: Stable Ambulatory Status: Walker Discharge Destination: Home Transportation: Private Auto Accompanied By: self Schedule Follow-up Appointment: Yes Clinical Summary of Care: Electronic Signature(s) Signed: 10/27/2021 4:47:05 PM By: Erenest Blank Entered By: Erenest Blank on 10/27/2021  16:22:27 -------------------------------------------------------------------------------- Patient/Caregiver Education Details Patient Name: Date of Service: [name] 10/18/2023andnbsp3:00 PM Medical Record Number: [id number] Patient Account Number: [id number] Date of Birth/Gender: Treating RN: 19-Nov-1955 (66 y.o. M) Primary Care Physician: [name] Other Clinician: Referring Physician: Treating Physician/Extender: Estelle Grumbles in Treatment: 12 Education Assessment Education Provided To: Patient Education Topics Provided Electronic Signature(s) Signed: 10/27/2021 4:47:05 PM By: Erenest Blank Entered By: Erenest Blank on 10/27/2021 16:22:09 [name], [name] ([id number]) [id number] Page 3 of 5 -------------------------------------------------------------------------------- Wound Assessment Details Patient Name: Date of Service: [name], [name] 10/27/2021 3:00 PM Medical Record Number: [id number] Patient Account Number: [id number]  Date of Birth/Sex: Treating RN: [birth year] (66 y.o. M) Primary Care Wenceslao Loper: [name] Other Clinician: Referring Kenyah Luba: Treating Angel Weedon/Extender: Estelle Grumbles in Treatment: 12 Wound Status Wound Number: 12 Primary Etiology: Venous Leg Ulcer Wound Location: Left, Anterior Lower Leg Secondary Etiology: Lymphedema Wounding Event: Gradually Appeared Wound Status: Open Date Acquired: 07/15/2021 Weeks Of Treatment: 12 Clustered Wound: No Wound Measurements Length: (cm) 1.5 Width: (cm) 2.5 Depth: (cm) 0.1 Area: (cm) 2.945 Volume: (cm) 0.295 % Reduction in Area: 78.5% % Reduction in Volume: 78.4% Wound Description Classification: Full Thickness Without Exposed Suppor Exudate Amount: Large Exudate Type: Serosanguineous Exudate Color: red, brown t Structures Periwound Skin Texture Texture Color No Abnormalities Noted: No No Abnormalities Noted:  No Moisture No Abnormalities Noted: No Treatment Notes Wound #12 (Lower Leg) Wound Laterality: Left, Anterior Cleanser Peri-Wound Care Triamcinolone 15 (g) Discharge Instruction: Use triamcinolone 15 (g) as directed Sween Lotion (Moisturizing lotion) Discharge Instruction: Apply moisturizing lotion as directed Topical Primary Dressing KerraCel Ag Gelling Fiber Dressing, 2x2 in (silver alginate) Discharge Instruction: Apply silver alginate to wound bed as instructed Secondary Dressing Optifoam Non-Adhesive Dressing, 4x4 in Discharge Instruction: [place]. Woven Gauze Sponge, Non-Sterile 4x4 in Discharge Instruction: Apply over primary dressing as directed. Secured With Compression Wrap ThreePress (3 layer compression wrap) Discharge Instruction: Apply three layer use kerlix instead of cotton compression as directed. Compression Stockings [name], [name] ([id number]) [id number] Page 4 of 5 Add-Ons Electronic Signature(s) Signed: 10/27/2021 4:47:05 PM By: Erenest Blank Entered By: Erenest Blank on 10/27/2021 15:13:25 -------------------------------------------------------------------------------- Wound Assessment Details Patient Name: Date of Service: [name]. 10/27/2021 3:00 PM Medical Record Number: [id number] Patient Account Number: [id number] Date of Birth/Sex: Treating RN: 03/22/1955 (66 y.o. M) Primary Care Malynn Lucy: [name] Other Clinician: Referring Poetry Cerro: Treating Hezzie Karim/Extender: Estelle Grumbles in Treatment: 12 Wound Status Wound Number: 7 Primary Etiology: Venous Leg Ulcer Wound Location: Right, Medial Malleolus Secondary Etiology: Lymphedema Wounding Event: Blister Wound Status: Open Date Acquired: 07/21/2021 Weeks Of Treatment: 12 Clustered Wound: No Wound Measurements Length: (cm) 4 Width: (cm) 5 Depth: (cm) 0.2 Area: (cm) 15.708 Volume: (cm)  3.142 % Reduction in Area: 59% % Reduction in Volume: 88.3% Wound Description Classification: Full Thickness With Exposed Support Exudate Amount: Medium Exudate Type: Serosanguineous Exudate Color: red, brown Structures Periwound Skin Texture Texture Color No Abnormalities Noted: No No Abnormalities Noted: No Moisture No Abnormalities Noted: No Treatment Notes Wound #7 (Malleolus) Wound Laterality: Right, Medial Cleanser Peri-Wound Care Triamcinolone 15 (g) Discharge Instruction: Use triamcinolone 15 (g) as directed Sween Lotion (Moisturizing lotion) Discharge Instruction: Apply moisturizing lotion as directed Topical [place] topical compounding antibiotics Discharge Instruction: **HOLD FOR THIS WEEK**apply directly to wound bed. Primary Dressing Promogran Prisma Matrix, 4.34 (sq in) (silver collagen) Discharge Instruction: Moisten collagen with saline or hydrogel Secondary Dressing ABD Pad, 8x10 Discharge Instruction: Apply over primary dressing as directed. [name], [name] ([id number]) [id number] Page 5 of 5 Secured With Compression Wrap ThreePress (3 layer compression wrap) Discharge Instruction: Apply three layer use kerlix instead of cotton compression as directed. Compression Stockings Add-Ons Electronic Signature(s) Signed: 10/27/2021 4:47:05 PM By: Erenest Blank Entered By: Erenest Blank on 10/27/2021 15:13:25 -------------------------------------------------------------------------------- Vitals Details Patient Name: Date of Service: [name]. 10/27/2021 3:00 PM Medical Record Number: [id number] Patient Account Number: [id number] Date of Birth/Sex: Treating RN: December 11, 1955 (66 y.o. M) Primary Care Camden Mazzaferro: [name] Other Clinician: Referring Omri Bertran: Treating Aspyn Warnke/Extender: Estelle Grumbles in Treatment: 12  Vital Signs Time Taken: 15:12 Temperature (F): 97.8 Height (in): 71 Pulse (bpm):  91 Weight (lbs): 350 Respiratory Rate (breaths/min): 18 Body Mass Index (BMI): 48.8 Blood Pressure (mmHg): 158/96 Reference Range: 80 - 120 mg / dl Electronic Signature(s) Signed: 10/27/2021 4:47:05 PM By: Erenest Blank Entered By: Erenest Blank on 10/27/2021 15:13:04

## 2021-10-31 ENCOUNTER — Other Ambulatory Visit: Payer: Self-pay | Admitting: Podiatry

## 2021-11-01 ENCOUNTER — Ambulatory Visit (INDEPENDENT_AMBULATORY_CARE_PROVIDER_SITE_OTHER): Payer: Medicare Other | Admitting: *Deleted

## 2021-11-01 NOTE — Progress Notes (Signed)
Patient presents to the office today for diabetic shoe and insole measuring.  Patient was measured with brannock device to determine size and width for 1 pair of extra depth shoes and foam casted for 3 pair of insoles.   Documentation of medical necessity will be sent to patient's treating diabetic doctor to verify and sign.   Patient's diabetic provider: Dr. [name]  Shoes and insoles will be ordered at that time and patient will be notified for an appointment for fitting when they arrive.   Shoe size (per patient): 14   Patient shoe selection-   Shoe choice:   Orthofeet 619  Shoe size ordered: Men's 14 X-Wide  Patient will be getting a transmet filler for the left foot.

## 2021-11-03 ENCOUNTER — Other Ambulatory Visit (HOSPITAL_COMMUNITY): Payer: Self-pay

## 2021-11-03 ENCOUNTER — Encounter: Payer: Self-pay | Admitting: Internal Medicine

## 2021-11-03 ENCOUNTER — Encounter (HOSPITAL_BASED_OUTPATIENT_CLINIC_OR_DEPARTMENT_OTHER): Payer: Medicare Other | Admitting: Physician Assistant

## 2021-11-03 DIAGNOSIS — I2510 Atherosclerotic heart disease of native coronary artery without angina pectoris: Secondary | ICD-10-CM | POA: Diagnosis not present

## 2021-11-03 DIAGNOSIS — I87333 Chronic venous hypertension (idiopathic) with ulcer and inflammation of bilateral lower extremity: Secondary | ICD-10-CM | POA: Diagnosis not present

## 2021-11-03 DIAGNOSIS — L97312 Non-pressure chronic ulcer of right ankle with fat layer exposed: Secondary | ICD-10-CM | POA: Diagnosis not present

## 2021-11-03 DIAGNOSIS — L97818 Non-pressure chronic ulcer of other part of right lower leg with other specified severity: Secondary | ICD-10-CM | POA: Diagnosis not present

## 2021-11-03 DIAGNOSIS — I890 Lymphedema, not elsewhere classified: Secondary | ICD-10-CM | POA: Diagnosis not present

## 2021-11-03 DIAGNOSIS — I872 Venous insufficiency (chronic) (peripheral): Secondary | ICD-10-CM | POA: Diagnosis not present

## 2021-11-03 DIAGNOSIS — E11621 Type 2 diabetes mellitus with foot ulcer: Secondary | ICD-10-CM | POA: Diagnosis not present

## 2021-11-03 DIAGNOSIS — L97828 Non-pressure chronic ulcer of other part of left lower leg with other specified severity: Secondary | ICD-10-CM | POA: Diagnosis not present

## 2021-11-03 DIAGNOSIS — Z6841 Body Mass Index (BMI) 40.0 and over, adult: Secondary | ICD-10-CM | POA: Diagnosis not present

## 2021-11-03 DIAGNOSIS — L97518 Non-pressure chronic ulcer of other part of right foot with other specified severity: Secondary | ICD-10-CM | POA: Diagnosis not present

## 2021-11-03 DIAGNOSIS — Z89422 Acquired absence of other left toe(s): Secondary | ICD-10-CM | POA: Diagnosis not present

## 2021-11-03 NOTE — Progress Notes (Signed)
MARSHAUN, LORTIE ([id number]) [id number] Page 1 of 9 Visit Report for 11/03/2021 Chief Complaint Document Details Patient Name: Date of Service: [name], [name] 11/03/2021 3:00 PM Medical Record Number: [id number] Patient Account Number: [id number] Date of Birth/Sex: Treating RN: 05-14-1955 (66 y.o. M) Primary Care Provider: [name] Other Clinician: Referring Provider: Treating Provider/Extender: [name] in Treatment: 13 Information Obtained from: Patient Chief Complaint 08/04/2021; patient returns to clinic with bilateral leg wounds as well as areas on the right foot Electronic Signature(s) Signed: 11/03/2021 4:32:45 PM By: [name] PA-C Entered By: [name] on 11/03/2021 16:32:45 -------------------------------------------------------------------------------- Debridement Details Patient Name: Date of Service: [name], [name] 11/03/2021 3:00 PM Medical Record Number: [id number] Patient Account Number: [id number] Date of Birth/Sex: Treating RN: 06-Feb-1955 (66 y.o. [name], [name] Primary Care Provider: [name] Other Clinician: Referring Provider: Treating Provider/Extender: [name] in Treatment: 13 Debridement Performed for Assessment: Wound #7 Right,Medial Malleolus Performed By: Physician [name], PA Debridement Type: Debridement Severity of Tissue Pre Debridement: Fat layer exposed Level of Consciousness (Pre-procedure): Awake and Alert Pre-procedure Verification/Time Out Yes - 16:45 Taken: Start Time: 16:46 Pain Control: Lidocaine 5% topical ointment T Area Debrided (L x W): otal 4.4 (cm) x 3.8 (cm) = 16.72 (cm) Tissue and other material debrided: Viable, Non-Viable, Callus, Slough, Subcutaneous, Skin: Dermis , Skin: Epidermis, Slough Level: Skin/Subcutaneous Tissue Debridement Description: Excisional Instrument: Curette Bleeding: Minimum Hemostasis  Achieved: Pressure End Time: 16:56 Procedural Pain: 0 Post Procedural Pain: 0 Response to Treatment: Procedure was tolerated well Level of Consciousness (Post- Awake and Alert procedure): Post Debridement Measurements of Total Wound Length: (cm) 4.4 Width: (cm) 3.8 Depth: (cm) 0.2 Volume: (cm) 2.626 Character of Wound/Ulcer Post Debridement: Improved Severity of Tissue Post Debridement: Fat layer exposed [name] ([id number]) [id number] Page 2 of 9 Post Procedure Diagnosis Same as Pre-procedure Electronic Signature(s) Signed: 11/03/2021 6:42:42 PM By: [name] PA-C Signed: 11/03/2021 6:57:47 PM By: Deon Pilling RN, BSN Entered By: Deon Pilling on 11/03/2021 16:57:11 -------------------------------------------------------------------------------- HPI Details Patient Name: Date of Service: [name], [name] 11/03/2021 3:00 PM Medical Record Number: [id number] Patient Account Number: [id number] Date of Birth/Sex: Treating RN: [birth year] (66 y.o. M) Primary Care Provider: [name] Other Clinician: Referring Provider: Treating Provider/Extender: [name] in Treatment: 13 History of Present Illness HPI Description: ADMISSION 03/22/2021 This is a 66 year old man with a past medical history significant for diabetes type 2, congestive heart failure, peripheral arterial disease, morbid obesity, venous insufficiency, and coronary artery disease. He has been followed by [name] in podiatry, who performed a transmetatarsal amputation on the left foot in August 2022. He had issues healing that wound, but based upon Dr. [name] notes, ultimately the TMA wound healed. During his recovery from that surgery, however, ulcers opened up over the DIP joint of the right second and third toe. These have apparently closed and reopened multiple times. It sounds like one of the issues has been moisture accumulation and maceration  of the tissues causing them to reopen. At his last visit with [name], on March 01, 2021, there continues to be problems with moisture and he was referred to wound care for further evaluation and management. He had a formal aortogram with runoff performed prior to his TMA. The findings are copied here: Patient has inline flow to both feet with no significant flow-limiting lesion that would be amenable to percutaneous or open revascularization.  He does have an element of small vessel disease and has a short segment occlusion of the distal anterior tibial/dorsalis pedis artery on the left foot but does have posterior tibial artery flow. Would recommend management of wounds with amputation of toes 2 and 3 on the right foot if the wounds do not heal and deteriorate. Transmetatarsal amputation on the left side has as good a blood supply as it is going to get and hopefully this will heal in the future. Formal ABIs were done in January 2023. They are normal bilaterally. ABI Findings: +---------+------------------+-----+----------+--------+ Right Rt Pressure (mmHg)IndexWaveform Comment  +---------+------------------+-----+----------+--------+ Brachial 160     +---------+------------------+-----+----------+--------+ PTA 192 1.06 monophasic  +---------+------------------+-----+----------+--------+ DP 159 0.88 monophasic  +---------+------------------+-----+----------+--------+ Great T oe145 0.80    +---------+------------------+-----+----------+--------+ +--------+------------------+-----+---------+-------+ Left Lt Pressure (mmHg)IndexWaveform Comment +--------+------------------+-----+---------+-------+ [id number]     +--------+------------------+-----+---------+-------+ PTA 204 1.13 triphasic  +--------+------------------+-----+---------+-------+ DP 194 1.07 biphasic    +--------+------------------+-----+---------+-------+ +-------+-----------+-----------+------------+------------+ ABI/TBIT oday's ABIT oday's TBIPrevious ABIPrevious TBI +-------+-----------+-----------+------------+------------+ Right 1.06 0.8 1.26 0.65  +-------+-----------+-----------+------------+------------+ Left 1.13 amputation 1.15 amputation  +-------+-----------+-----------+------------+------------+ Previous ABI on 08/06/20 at [facility] Pedal pressures falsely elevated due to medial calcification. ANSEL, FERRALL ([id number]) [id number] Page 3 of 9 Summary: Right: Resting right ankle-brachial index is within normal range. The right toe-brachial index is normal. Left: Resting left ankle-brachial index is within normal range. READMISSION 08/04/2021 Mr. Alcock is now a 66 year old man who I remember from this clinic many years ago I think he had a right lower extremity predominantly venous wound at the time. He was here for 1 visit in March of this year had wounds on his right second and third toes we apparently dressed them many and they healed so he did not come back. He is listed in [place] is being a diabetic although the patient denies this says he is verified it with his primary doctor. In any case over the last several weeks or so according the patient although these wounds look somewhat more chronic than that he has developed predominantly large wounds on the right medial and right lateral ankle smaller areas on the left leg and areas on the dorsal aspect of his right second and third toes. Its not clear how he has been dressing these. 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 The patient has known PAD. He had an angiogram in August 2022 at which time he had nonhealing wounds in both feet. On the left his major vessels in the thigh were all patent. He  had three-vessel patent to the level of the ankle. He had a very short occlusion in the left anterior tibial. On the right lower extremity the major vessels in his thigh were all patent. He had three-vessel runoff to the foot sluggish filling of the anterior tibial artery. He was felt to have some component of small vessel disease but nothing that was amenable or needed revascularization. It was recommended that he have amputation of the second and third toes on the right foot if they did not heal He has been following with [name] of podiatry. [name] got him juxta lite stockings although I do not think he had them on properly he has uncontrolled edema in both legs Past medical history includes type 2 diabetes [although the patient really denies this], left TMA in 2022,lower extremity wounds in fact attendance at this clinic in 2009-2010, A-fib on Eliquis, chronic venous insufficiency with secondary lymphedema history  of non-Hodgkin's lymphoma. 08-11-2021 upon evaluation today patient presents for follow-up evaluation he was seen last Wednesday initially for inspection here in our clinic. With that being said he tells me that he unfortunately has been having a lot of drainage and is actually coming through his wrap. Fortunately I do not see any evidence of active infection locally or systemically at this time which is great news. No fevers, chills, nausea, vomiting, or diarrhea. With that being said there does appear to be some evidence of local infection based on what I am seeing today. 08-18-2021 upon evaluation today patient appears to be doing okay currently in regard to his wounds with that being said that he is doing much better but still has a long ways to go to get to where he wanted to be. I think the infection is significantly improved. He has another week of the antibiotic at this point. 08-25-2021 upon evaluation today patient's wounds are actually doing decently well he has erythema has  significantly improved. I think the cellulitis is under controlled I am going to continue him on 2 more weeks of the Levaquin at 500 mg this is a lower dose but I am hoping it will be better for him. 09-08-2021 upon evaluation today patient appears to be doing excellent in regard to his wounds. Since I last saw him he was actually in the hospital where he ended up having a pacemaker put in. Subsequently he tells me that he is actually doing quite well although they were unsure whether they were going to do it due to the fact that he had the wounds on his legs. And then I am glad they did anything seem to be doing well. 09-15-2021 upon evaluation patient's wounds are actually showing signs of improvement. The right side wounds do appear to have some need for sharp debridement today and I Georgina Peer go ahead and proceed with that. I think that if we get the wounds cleaned up he will actually show signs of continued improvement. I am also leaning towards switching to [facility] which I think will be a much better option for him. 09-22-2021 patient's wounds are showing signs of excellent improvement. I am actually extremely pleased with where we stand and I think that the patient is making great progress. There does not appear to be any signs of active infection. 09-29-2021 upon evaluation today patient appears to be doing excellent in regard to his wounds. He is actually tolerating the dressing changes without complication. Fortunately I see no evidence of active infection locally or systemically at this time which is great news and overall I am extremely pleased with where we stand currently. 09-2721 upon evaluation today patient actually appears to be doing excellent in regard to his wounds. The left leg is almost completely healed the right ankle is significantly smaller. Overall I am extremely pleased with where we stand at this point. I do not see any evidence of active infection at this time. 10-13-2021  upon evaluation today patient appears to be doing excellent in regard to his wounds. I really feel like he is making good progress here and I am very pleased in that regard. Fortunately I do not see any signs of active infection at this time. We are using the [facility] topical antibiotic therapy. 10-20-2021 upon evaluation today patient appears to be doing well with regard to his wound on the right medial ankle region the left leg is almost completely healed. I am actually very pleased with where  we stand today. 11-03-2021 upon evaluation today patient's wound actually is going require some sharp debridement but appears to be doing much better which is great news. Fortunately I do not see any signs of active infection at this time. Electronic Signature(s) Signed: 11/03/2021 6:35:26 PM By: [name] PA-C Entered By: [name] on 11/03/2021 18:35:25 -------------------------------------------------------------------------------- Physical Exam Details Patient Name: Date of Service: [name], [name] 11/03/2021 3:00 PM Medical Record Number: [id number] Patient Account Number: [id number] Date of Birth/Sex: Treating RN: 1955/05/26 (66 y.o. M) Primary Care Provider: [name] Other Clinician: Referring Provider: Treating Provider/Extender: [name] in Treatment: [address] [place] [name] ([id number]) [id number] Page 4 of 9 Constitutional Well-nourished and well-hydrated in no acute distress. Respiratory normal breathing without difficulty. Psychiatric this patient is able to make decisions and demonstrates good insight into disease process. Alert and Oriented x 3. pleasant and cooperative. Notes Patient's wound bed showed evidence of good granulation and epithelization at this point. Fortunately I do not see any evidence of active infection locally or systemically which is great news and overall I am extremely pleased with where we stand  today. Electronic Signature(s) Signed: 11/03/2021 6:35:47 PM By: [name] PA-C Entered By: [name] on 11/03/2021 18:35:47 -------------------------------------------------------------------------------- Physician Orders Details Patient Name: Date of Service: [name], [name] 11/03/2021 3:00 PM Medical Record Number: [id number] Patient Account Number: [id number] Date of Birth/Sex: Treating RN: [birth year] (66 y.o. [name], [name] Primary Care Provider: [name] Other Clinician: Referring Provider: Treating Provider/Extender: [name] in Treatment: [phone] Verbal / Phone Orders: No Diagnosis Coding ICD-10 Coding Code Description I89.0 Lymphedema, not elsewhere classified I87.333 Chronic venous hypertension (idiopathic) with ulcer and inflammation of bilateral lower extremity L97.828 Non-pressure chronic ulcer of other part of left lower leg with other specified severity L97.818 Non-pressure chronic ulcer of other part of right lower leg with other specified severity E11.621 Type 2 diabetes mellitus with foot ulcer L97.518 Non-pressure chronic ulcer of other part of right foot with other specified severity Follow-up Appointments ppointment in 1 week. Margarita Grizzle on Wednesday Return A Other: - [place] topical compounding antibiotics- bring to your appointments. bring in compression stockings to weekly appts. Anesthetic (In clinic) Topical Lidocaine 5% applied to wound bed Bathing/ Shower/ Hygiene May shower with protection but do not get wound dressing(s) wet. Edema Control - Lymphedema / SCD / Other Elevate legs to the level of the heart or above for 30 minutes daily and/or when sitting, a frequency of: - 3-4 times a day throughout the day. Avoid standing for long periods of time. Exercise regularly Moisturize legs daily. - apply every night before bed to left leg. Compression stocking or Garment 30-40 mm/Hg pressure to: - wear your  VIVE compression stocking to left leg. apply in the morning and remove at night. Will apply tubigrip size [name] to left leg. Remove once you get home and use your compression stockings. Wound Treatment Wound #7 - Malleolus Wound Laterality: Right, Medial Peri-Wound Care: Triamcinolone 15 (g) 1 x Per Week/30 Days Discharge Instructions: Use triamcinolone 15 (g) as directed Peri-Wound Care: Sween Lotion (Moisturizing lotion) 1 x Per Week/30 Days Discharge Instructions: Apply moisturizing lotion as directed [name], [name] ([id number]) [id number] Page 5 of 9 Topical: [facility] topical compounding antibiotics 1 x Per Week/30 Days Discharge Instructions: **HOLD FOR THIS WEEK**apply directly to wound bed. Prim Dressing: Promogran Prisma Matrix, 4.34 (sq in) (silver collagen) 1 x Per Week/30 Days  ary Discharge Instructions: Moisten collagen with saline or hydrogel Secondary Dressing: ABD Pad, 8x10 1 x Per Week/30 Days Discharge Instructions: Apply over primary dressing as directed. Compression Wrap: ThreePress (3 layer compression wrap) 1 x Per Week/30 Days Discharge Instructions: Apply three layer use kerlix instead of cotton compression as directed. Electronic Signature(s) Signed: 11/03/2021 6:42:42 PM By: [name] PA-C Signed: 11/03/2021 6:57:47 PM By: Deon Pilling RN, BSN Entered By: Deon Pilling on 11/03/2021 16:59:51 -------------------------------------------------------------------------------- Problem List Details Patient Name: Date of Service: [name], [name] 11/03/2021 3:00 PM Medical Record Number: [id number] Patient Account Number: [id number] Date of Birth/Sex: Treating RN: 05/07/1955 (66 y.o. M) Primary Care Provider: [name] Other Clinician: Referring Provider: Treating Provider/Extender: [name] in Treatment: 13 Active Problems ICD-10 Encounter Code Description Active Date MDM Diagnosis I89.0 Lymphedema, not  elsewhere classified 08/04/2021 No Yes I87.333 Chronic venous hypertension (idiopathic) with ulcer and inflammation of 08/04/2021 No Yes bilateral lower extremity L97.828 Non-pressure chronic ulcer of other part of left lower leg with other specified 08/04/2021 No Yes severity L97.818 Non-pressure chronic ulcer of other part of right lower leg with other specified 08/04/2021 No Yes severity E11.621 Type 2 diabetes mellitus with foot ulcer 08/04/2021 No Yes L97.518 Non-pressure chronic ulcer of other part of right foot with other specified 08/04/2021 No Yes severity Inactive Problems Resolved Problems Electronic Signature(sJAFARI, [name] ([id number]) [id number] Page 6 of 9 Signed: 11/03/2021 4:32:39 PM By: [name] PA-C Entered By: [name] on 11/03/2021 16:32:39 -------------------------------------------------------------------------------- Progress Note Details Patient Name: Date of Service: [name], [name] 11/03/2021 3:00 PM Medical Record Number: [id number] Patient Account Number: [id number] Date of Birth/Sex: Treating RN: March 09, 1955 (66 y.o. M) Primary Care Provider: [name] Other Clinician: Referring Provider: Treating Provider/Extender: [name] in Treatment: 13 Subjective Chief Complaint Information obtained from Patient 08/04/2021; patient returns to clinic with bilateral leg wounds as well as areas on the right foot History of Present Illness (HPI) ADMISSION 03/22/2021 This is a 66 year old man with a past medical history significant for diabetes type 2, congestive heart failure, peripheral arterial disease, morbid obesity, venous insufficiency, and coronary artery disease. He has been followed by [name] in podiatry, who performed a transmetatarsal amputation on the left foot in August 2022. He had issues healing that wound, but based upon Dr. [name] notes, ultimately the TMA wound healed. During  his recovery from that surgery, however, ulcers opened up over the DIP joint of the right second and third toe. These have apparently closed and reopened multiple times. It sounds like one of the issues has been moisture accumulation and maceration of the tissues causing them to reopen. At his last visit with [name], on March 01, 2021, there continues to be problems with moisture and he was referred to wound care for further evaluation and management. He had a formal aortogram with runoff performed prior to his TMA. The findings are copied here: Patient has inline flow to both feet with no significant flow-limiting lesion that would be amenable to percutaneous or open revascularization. He does have an element of small vessel disease and has a short segment occlusion of the distal anterior tibial/dorsalis pedis artery on the left foot but does have posterior tibial artery flow. Would recommend management of wounds with amputation of toes 2 and 3 on the right foot if the wounds do not heal and deteriorate. Transmetatarsal amputation on the left side has as good a blood  supply as it is going to get and hopefully this will heal in the future. Formal ABIs were done in January 2023. They are normal bilaterally. ABI Findings: +---------+------------------+-----+----------+--------+ Right Rt Pressure (mmHg)IndexWaveform Comment  +---------+------------------+-----+----------+--------+ Brachial 160    +---------+------------------+-----+----------+--------+ PTA 192 1.06 monophasic  +---------+------------------+-----+----------+--------+ DP 159 0.88 monophasic  +---------+------------------+-----+----------+--------+ Great T oe145 0.80    +---------+------------------+-----+----------+--------+ +--------+------------------+-----+---------+-------+ Left Lt Pressure (mmHg)IndexWaveform  Comment +--------+------------------+-----+---------+-------+ [id number]    +--------+------------------+-----+---------+-------+ PTA 204 1.13 triphasic  +--------+------------------+-----+---------+-------+ DP 194 1.07 biphasic   +--------+------------------+-----+---------+-------+ +-------+-----------+-----------+------------+------------+ ABI/TBIT oday's ABIT oday's TBIPrevious ABIPrevious TBI +-------+-----------+-----------+------------+------------+ Right 1.06 0.8 1.26 0.65  +-------+-----------+-----------+------------+------------+ Left 1.13 amputation 1.15 amputation  +-------+-----------+-----------+------------+------------+ Previous ABI on 08/06/20 at [facility] Pedal pressures falsely elevated due to medial calcification. Summary: Right: Resting right ankle-brachial index is within normal range. The right toe-brachial index is normal. [name], [name] ([id number]) [id number] Page 7 of 9 Left: Resting left ankle-brachial index is within normal range. READMISSION 08/04/2021 Mr. Drewes is now a 66 year old man who I remember from this clinic many years ago I think he had a right lower extremity predominantly venous wound at the time. He was here for 1 visit in March of this year had wounds on his right second and third toes we apparently dressed them many and they healed so he did not come back. He is listed in [place] is being a diabetic although the patient denies this says he is verified it with his primary doctor. In any case over the last several weeks or so according the patient although these wounds look somewhat more chronic than that he has developed predominantly large wounds on the right medial and right lateral ankle smaller areas on the left leg and areas on the dorsal aspect of his right second and third toes. Its not clear how he has been dressing these. 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 The patient has known PAD. He had an angiogram in August 2022 at which time he had nonhealing wounds in both feet. On the left his major vessels in the thigh were all patent. He had three-vessel patent to the level of the ankle. He had a very short occlusion in the left anterior tibial. On the right lower extremity the major vessels in his thigh were all patent. He had three-vessel runoff to the foot sluggish filling of the anterior tibial artery. He was felt to have some component of small vessel disease but nothing that was amenable or needed revascularization. It was recommended that he have amputation of the second and third toes on the right foot if they did not heal He has been following with [name] of podiatry. [name] got him juxta lite stockings although I do not think he had them on properly he has uncontrolled edema in both legs Past medical history includes type 2 diabetes [although the patient really denies this], left TMA in 2022,lower extremity wounds in fact attendance at this clinic in 2009-2010, A-fib on Eliquis, chronic venous insufficiency with secondary lymphedema history of non-Hodgkin's lymphoma. 08-11-2021 upon evaluation today patient presents for follow-up evaluation he was seen last Wednesday initially for inspection here in our clinic. With that being said he tells me that he unfortunately has been having a lot of drainage and is actually coming through his wrap. Fortunately I do not see any evidence of active infection locally or systemically at this time which is great news. No fevers,  chills, nausea, vomiting, or diarrhea. With that being said there does appear to be some evidence of local infection based on what I am seeing today. 08-18-2021 upon evaluation today patient appears to be doing okay currently in regard to his wounds with that being said that he is doing much better but still  has a long ways to go to get to where he wanted to be. I think the infection is significantly improved. He has another week of the antibiotic at this point. 08-25-2021 upon evaluation today patient's wounds are actually doing decently well he has erythema has significantly improved. I think the cellulitis is under controlled I am going to continue him on 2 more weeks of the Levaquin at 500 mg this is a lower dose but I am hoping it will be better for him. 09-08-2021 upon evaluation today patient appears to be doing excellent in regard to his wounds. Since I last saw him he was actually in the hospital where he ended up having a pacemaker put in. Subsequently he tells me that he is actually doing quite well although they were unsure whether they were going to do it due to the fact that he had the wounds on his legs. And then I am glad they did anything seem to be doing well. 09-15-2021 upon evaluation patient's wounds are actually showing signs of improvement. The right side wounds do appear to have some need for sharp debridement today and I Georgina Peer go ahead and proceed with that. I think that if we get the wounds cleaned up he will actually show signs of continued improvement. I am also leaning towards switching to [facility] which I think will be a much better option for him. 09-22-2021 patient's wounds are showing signs of excellent improvement. I am actually extremely pleased with where we stand and I think that the patient is making great progress. There does not appear to be any signs of active infection. 09-29-2021 upon evaluation today patient appears to be doing excellent in regard to his wounds. He is actually tolerating the dressing changes without complication. Fortunately I see no evidence of active infection locally or systemically at this time which is great news and overall I am extremely pleased with where we stand currently. 09-2721 upon evaluation today patient actually appears to be  doing excellent in regard to his wounds. The left leg is almost completely healed the right ankle is significantly smaller. Overall I am extremely pleased with where we stand at this point. I do not see any evidence of active infection at this time. 10-13-2021 upon evaluation today patient appears to be doing excellent in regard to his wounds. I really feel like he is making good progress here and I am very pleased in that regard. Fortunately I do not see any signs of active infection at this time. We are using the [facility] topical antibiotic therapy. 10-20-2021 upon evaluation today patient appears to be doing well with regard to his wound on the right medial ankle region the left leg is almost completely healed. I am actually very pleased with where we stand today. 11-03-2021 upon evaluation today patient's wound actually is going require some sharp debridement but appears to be doing much better which is great news. Fortunately I do not see any signs of active infection at this time. Objective Constitutional Well-nourished and well-hydrated in no acute distress. Vitals Time Taken: 3:40 PM, Height: 71 in, Weight: 350 lbs, BMI: 48.8, Temperature: 97.9 F, Pulse: 91 bpm, Respiratory Rate:  20 breaths/min, Blood Pressure: 148/97 mmHg. Respiratory normal breathing without difficulty. Psychiatric this patient is able to make decisions and demonstrates good insight into disease process. Alert and Oriented x 3. pleasant and cooperative. General Notes: Patient's wound bed showed evidence of good granulation and epithelization at this point. Fortunately I do not see any evidence of active infection locally or systemically which is great news and overall I am extremely pleased with where we stand today. Integumentary (Hair, Skin) [name], [name] ([id number]) [id number] Page 8 of 9 Wound #12 status is Open. Original cause of wound was Gradually Appeared. The date acquired was:  07/15/2021. The wound has been in treatment 13 weeks. The wound is located on the Left,Anterior Lower Leg. The wound measures 0cm length x 0cm width x 0cm depth; 0cm^2 area and 0cm^3 volume. There is no tunneling or undermining noted. There is a none present amount of drainage noted. The wound margin is distinct with the outline attached to the wound base. There is no granulation within the wound bed. There is no necrotic tissue within the wound bed. The periwound skin appearance did not exhibit: Callus, Crepitus, Excoriation, Induration, Rash, Scarring, Dry/Scaly, Maceration, Atrophie Blanche, Cyanosis, Ecchymosis, Hemosiderin Staining, Mottled, Pallor, Rubor, Erythema. Wound #7 status is Open. Original cause of wound was Blister. The date acquired was: 07/21/2021. The wound has been in treatment 13 weeks. The wound is located on the Right,Medial Malleolus. The wound measures 4.4cm length x 3.8cm width x 0.2cm depth; 13.132cm^2 area and 2.626cm^3 volume. There is Fat Layer (Subcutaneous Tissue) exposed. There is no tunneling or undermining noted. There is a medium amount of serosanguineous drainage noted. The wound margin is distinct with the outline attached to the wound base. There is medium (34-66%) red, pink granulation within the wound bed. There is a medium (34-66%) amount of necrotic tissue within the wound bed including Adherent Slough. The periwound skin appearance did not exhibit: Callus, Crepitus, Excoriation, Induration, Rash, Scarring, Dry/Scaly, Maceration, Atrophie Blanche, Cyanosis, Ecchymosis, Hemosiderin Staining, Mottled, Pallor, Rubor, Erythema. Assessment Active Problems ICD-10 Lymphedema, not elsewhere classified Chronic venous hypertension (idiopathic) with ulcer and inflammation of bilateral lower extremity Non-pressure chronic ulcer of other part of left lower leg with other specified severity Non-pressure chronic ulcer of other part of right lower leg with other specified  severity Type 2 diabetes mellitus with foot ulcer Non-pressure chronic ulcer of other part of right foot with other specified severity Procedures Wound #7 Pre-procedure diagnosis of Wound #7 is a Venous Leg Ulcer located on the Right,Medial Malleolus .Severity of Tissue Pre Debridement is: Fat layer exposed. There was a Excisional Skin/Subcutaneous Tissue Debridement with a total area of 16.72 sq cm performed by [name], PA. With the following instrument(s): Curette to remove Viable and Non-Viable tissue/material. Material removed includes Callus, Subcutaneous Tissue, Slough, Skin: Dermis, and Skin: Epidermis after achieving pain control using Lidocaine 5% topical ointment. A time out was conducted at 16:45, prior to the start of the procedure. A Minimum amount of bleeding was controlled with Pressure. The procedure was tolerated well with a pain level of 0 throughout and a pain level of 0 following the procedure. Post Debridement Measurements: 4.4cm length x 3.8cm width x 0.2cm depth; 2.626cm^3 volume. Character of Wound/Ulcer Post Debridement is improved. Severity of Tissue Post Debridement is: Fat layer exposed. Post procedure Diagnosis Wound #7: Same as Pre-Procedure Pre-procedure diagnosis of Wound #7 is a Venous Leg Ulcer located on the Right,Medial Malleolus . There was a Three Layer Compression Therapy  Procedure by Deon Pilling, RN. Post procedure Diagnosis Wound #7: Same as Pre-Procedure Plan Follow-up Appointments: Return Appointment in 1 week. Margarita Grizzle on Wednesday Other: - [facility] topical compounding antibiotics- bring to your appointments. bring in compression stockings to weekly appts. Anesthetic: (In clinic) Topical Lidocaine 5% applied to wound bed Bathing/ Shower/ Hygiene: May shower with protection but do not get wound dressing(s) wet. Edema Control - Lymphedema / SCD / Other: Elevate legs to the level of the heart or above for 30 minutes daily and/or when  sitting, a frequency of: - 3-4 times a day throughout the day. Avoid standing for long periods of time. Exercise regularly Moisturize legs daily. - apply every night before bed to left leg. Compression stocking or Garment 30-40 mm/Hg pressure to: - wear your VIVE compression stocking to left leg. apply in the morning and remove at night. Will apply tubigrip size [name] to left leg. Remove once you get home and use your compression stockings. WOUND #7: - Malleolus Wound Laterality: Right, Medial Peri-Wound Care: Triamcinolone 15 (g) 1 x Per Week/30 Days Discharge Instructions: Use triamcinolone 15 (g) as directed Peri-Wound Care: Sween Lotion (Moisturizing lotion) 1 x Per Week/30 Days Discharge Instructions: Apply moisturizing lotion as directed Topical: [facility] topical compounding antibiotics 1 x Per Week/30 Days Discharge Instructions: **HOLD FOR THIS WEEK**apply directly to wound bed. Prim Dressing: Promogran Prisma Matrix, 4.34 (sq in) (silver collagen) 1 x Per Week/30 Days ary Discharge Instructions: Moisten collagen with saline or hydrogel Secondary Dressing: ABD Pad, 8x10 1 x Per Week/30 Days Discharge Instructions: Apply over primary dressing as directed. Com pression Wrap: ThreePress (3 layer compression wrap) 1 x Per Week/30 Days Discharge Instructions: Apply three layer use kerlix instead of cotton compression as directed. [name], CANDY ([id number]) [id number] Page 9 of 9 1. I am going to recommend that we have the patient continue to monitor for any signs of worsening or infection. Obviously based on what I am seeing I do believe that he is making progress my hope is that he will continue to show signs of improvement as we go forward. 2. I am also can recommend that we have the patient continue with the collagen followed by ABD pad and 3 layer compression wrap which I feel like is doing quite well. 3. With regard to the left leg he is going to use a cover  dressing and then follow this up with a compression sock which I think is good to be the best way to go currently. We will see patient back for reevaluation in 1 week here in the clinic. If anything worsens or changes patient will contact our office for additional recommendations. Electronic Signature(s) Signed: 11/03/2021 6:36:27 PM By: [name] PA-C Entered By: [name] on 11/03/2021 18:36:27 -------------------------------------------------------------------------------- SuperBill Details Patient Name: Date of Service: Zia, [name] 11/03/2021 Medical Record Number: [id number] Patient Account Number: [id number] Date of Birth/Sex: Treating RN: 1955/10/01 (66 y.o. [name], [name] Primary Care Provider: [name] Other Clinician: Referring Provider: Treating Provider/Extender: [name] in Treatment: 13 Diagnosis Coding ICD-10 Codes Code Description I89.0 Lymphedema, not elsewhere classified I87.333 Chronic venous hypertension (idiopathic) with ulcer and inflammation of bilateral lower extremity L97.828 Non-pressure chronic ulcer of other part of left lower leg with other specified severity L97.818 Non-pressure chronic ulcer of other part of right lower leg with other specified severity E11.621 Type 2 diabetes mellitus with foot ulcer L97.518 Non-pressure chronic ulcer of other part of right  foot with other specified severity Facility Procedures : 3 CPT4 Code: 5831674 Description: 25525 - DEB SUBQ TISSUE 20 SQ CM/< ICD-10 Diagnosis Description L97.818 Non-pressure chronic ulcer of other part of right lower leg with other specified Modifier: severity Quantity: 1 Physician Procedures : CPT4 Code Description Modifier [id number] 11042 - WC PHYS SUBQ TISS 20 SQ CM ICD-10 Diagnosis Description L97.818 Non-pressure chronic ulcer of other part of right lower leg with other specified severity Quantity: 1 Electronic Signature(s) Signed: 11/03/2021  6:39:50 PM By: [name] PA-C Entered By: [name] on 11/03/2021 18:39:49

## 2021-11-03 NOTE — Telephone Encounter (Signed)
Advanced Heart Failure Patient Advocate Encounter  Contacted [employer] for an update. Representative stated all prior forms were received aside from prescriber form.  Reprinted MD form from media tab, refaxed to [employer] on 11/03/21. Determination letter should be received within 3 business days.  Clista Bernhardt, CPhT Rx Patient Advocate Phone: [phone]

## 2021-11-04 NOTE — Progress Notes (Addendum)
GEORGIO, HATTABAUGH ([id number]) [id number] Page 1 of 8 Visit Report for 11/03/2021 Arrival Information Details Patient Name: Date of Service: [name], [name] 11/03/2021 3:00 PM Medical Record Number: [id number] Patient Account Number: [id number] Date of Birth/Sex: Treating RN: [birth year] (66 y.o. [name], [name] Primary Care [name]: [name] Other Clinician: Referring [name]: Treating [name]/Extender: [name]: 13 Visit Information History Since Last Visit Added or deleted any medications: No Patient Arrived: [name] Any new allergies or adverse reactions: No Arrival Time: 15:40 Had a fall or experienced change in No Accompanied By: self activities of daily living that may affect Transfer Assistance: None risk of falls: Patient Identification Verified: Yes Signs or symptoms of abuse/neglect since last visito No Secondary Verification Process Completed: Yes Hospitalized since last visit: No Patient Requires Transmission-Based Precautions: No Implantable device outside of the clinic excluding No Patient Has Alerts: Yes cellular tissue based products placed in the center Patient Alerts: Patient on Blood Thinner since last visit: 01/2021 ABI L 1.13 R 1.06 Has Dressing in Place as Prescribed: Yes 01/2021 TBI L amp R 0.8 Has Compression in Place as Prescribed: Yes Pain Present Now: No Electronic Signature(s) Signed: 11/03/2021 6:57:47 PM By: [name] Pilling RN, BSN Entered By: [name] on 11/03/2021 15:41:19 -------------------------------------------------------------------------------- Compression Therapy Details Patient Name: Date of Service: [name], [name] [name]. 11/03/2021 3:00 PM Medical Record Number: [id number] Patient Account Number: [id number] Date of Birth/Sex: Treating RN: [birth year] (67 y.o. [name] Primary Care Kymari Lollis: [name] Other Clinician: Referring Asante Blanda: Treating  Jina Olenick/Extender: [name]: 13 Compression Therapy Performed for Wound Assessment: Wound #7 Right,Medial Malleolus Performed By: Clinician [name] Pilling, RN Compression Type: Three Layer Post Procedure Diagnosis Same as Pre-procedure Electronic Signature(s) Signed: 11/03/2021 6:57:47 PM By: [name] Pilling RN, BSN Entered By: [name] on 11/03/2021 16:57:33 Mauro, [name] ([id number]) [id number] Page 2 of 8 -------------------------------------------------------------------------------- Encounter Discharge Information Details Patient Name: Date of Service: [name], [name] 11/03/2021 3:00 PM Medical Record Number: [id number] Patient Account Number: [id number] Date of Birth/Sex: Treating RN: [birth year] (66 y.o. [name] Primary Care Ad Guttman: [name] Other Clinician: Referring Kaion Tisdale: Treating Gracynn Rajewski/Extender: [name]: 13 Encounter Discharge Information Items Post Procedure Vitals Discharge Condition: Stable Temperature (F): 97.9 Ambulatory Status: Walker Pulse (bpm): 91 Discharge Destination: Home Respiratory Rate (breaths/min): 20 Transportation: Private Auto Blood Pressure (mmHg): 148/97 Accompanied By: self Schedule Follow-up Appointment: Yes Clinical Summary of Care: Electronic Signature(s) Signed: 11/03/2021 6:57:47 PM By: [name] Pilling RN, BSN Entered By: [name] on 11/03/2021 17:00:59 -------------------------------------------------------------------------------- Lower Extremity Assessment Details Patient Name: Date of Service: [name], [name] [name]. 11/03/2021 3:00 PM Medical Record Number: [id number] Patient Account Number: [id number] Date of Birth/Sex: Treating RN: 1955/11/15 (66 y.o. [name] Primary Care Stafford Riv[place]: [name] Other Clinician: Referring Dejana Pugsley: Treating Micholas Drumwright/Extender: [name]  in [name]: 13 Edema Assessment Assessed: [name]: Yes] [name]: Yes] Edema: [Left: Yes] [Right: Yes] Calf Left: Right: Point of Measurement: 31 cm From Medial Instep 38 cm 39 cm Ankle Left: Right: Point of Measurement: 11 cm From Medial Instep 25 cm 25 cm Vascular Assessment Pulses: Dorsalis Pedis Palpable: [Left:Yes] [Right:Yes] Electronic Signature(s) Signed: 11/03/2021 6:57:47 PM By: [name] Pilling RN, BSN Entered By: [name] on 11/03/2021 [name] -------------------------------------------------------------------------------- [name] ([id number]) [phone].pdf Page 3 of 8 Patient Name: Date of Service: [name], [name]. 11/03/2021 3:00 PM Medical  Record Number: [id number] Patient Account Number: [id number] Date of Birth/Sex: Treating RN: [birth year] (66 y.o. [name] Primary Care Zackaria Burkey: [name] Other Clinician: Referring Travus Oren: Treating Tametria Aho/Extender: [name]: [facility] reviewed with physician Active Inactive Pain, Acute or Chronic Nursing Diagnoses: Pain, acute or chronic: actual or potential Potential alteration in comfort, pain Goals: Patient will verbalize adequate pain control and receive pain control interventions during procedures as needed Date Initiated: 08/04/2021 Target Resolution Date: 12/03/2021 Goal Status: Active Patient/caregiver will verbalize comfort level met Date Initiated: 08/04/2021 Target Resolution Date: 12/03/2021 Goal Status: Active Interventions: Encourage patient to take pain medications as prescribed Provide education on pain management Reposition patient for comfort [name] Activities: Administer pain control measures as ordered : 08/04/2021 Notes: Electronic Signature(s) Signed: 11/03/2021 6:57:47 PM By: [name] Pilling RN, BSN Entered By: [name] on 11/03/2021  16:55:50 -------------------------------------------------------------------------------- Pain Assessment Details Patient Name: Date of Service: [name], [name] [name]. 11/03/2021 3:00 PM Medical Record Number: [id number] Patient Account Number: [id number] Date of Birth/Sex: Treating RN: 08-15-1955 (66 y.o. [name] Primary Care Karam Dunson: [name] Other Clinician: Referring Anaijah Augsburger: Treating Micco Bourbeau/Extender: [name]: 13 Active Problems Location of Pain Severity and Description of Pain Patient Has Paino No Site Locations Rate the pain. SUN, WILENSKY ([id number]) [id number] Page 4 of 8 Rate the pain. Current Pain Level: 0 Pain Management and Medication Current Pain Management: Medication: No Cold Application: No Rest: No Massage: No Activity: No T.E.[name].S.: No Heat Application: No Leg drop or elevation: No Is the Current Pain Management Adequate: Adequate How does your wound impact your activities of daily livingo Sleep: No Bathing: No Appetite: No Relationship With Others: No Bladder Continence: No Emotions: No Bowel Continence: No Work: No Toileting: No Drive: No Dressing: No Hobbies: No Engineer, maintenance) Signed: 11/03/2021 6:57:47 PM By: [name] Pilling RN, BSN Entered By: [name] on 11/03/2021 15:41:49 -------------------------------------------------------------------------------- Patient/Caregiver Education Details Patient Name: Date of Service: [name], [name] 10/25/2023andnbsp3:00 PM Medical Record Number: [id number] Patient Account Number: [id number] Date of Birth/Gender: Treating RN: 1955-10-15 (66 y.o. [name] Primary Care Physician: [name] Other Clinician: Referring Physician: Treating Physician/Extender: [name]: 13 Education Assessment Education Provided To: Patient Education Topics Provided Pain: Handouts:  A Guide to Pain Control Methods: Explain/Verbal Responses: Reinforcements needed Electronic Signature(s) Signed: 11/03/2021 6:57:47 PM By: [name] Pilling RN, BSN Entered By: [name] on 11/03/2021 16:56:03 Mangual, Lizandro [name] ([id number]) [id number] Page 5 of 8 -------------------------------------------------------------------------------- Wound Assessment Details Patient Name: Date of Service: [name], [name] 11/03/2021 3:00 PM Medical Record Number: [id number] Patient Account Number: [id number] Date of Birth/Sex: Treating RN: 1955/05/22 (66 y.o. [name], [name] Primary Care Velisa Regnier: [name] Other Clinician: Referring Vinicio Lynk: Treating Baruch Lewers/Extender: [name]: 13 Wound Status Wound Number: 12 Primary Venous Leg Ulcer Etiology: Wound Location: Left, Anterior Lower Leg Secondary Lymphedema Wounding Event: Gradually Appeared Etiology: Date Acquired: 07/15/2021 Wound Healed - Epithelialized Weeks Of [name]: 13 Status: Clustered Wound: No Comorbid Sleep Apnea, Arrhythmia, Congestive Heart Failure, History: Hypertension, Hypotension, Peripheral Arterial Disease, Peripheral Venous Disease, Type II Diabetes, Gout, Osteoarthritis Photos Wound Measurements Length: (cm) Width: (cm) Depth: (cm) Area: (cm) Volume: (cm) 0 % Reduction in Area: 100% 0 % Reduction in Volume: 100% 0 Epithelialization: None 0 Tunneling: No 0 Undermining: No Wound Description Classification: Full Thickness Without Exposed Support S Wound Margin: Distinct, outline attached  Exudate Amount: None Present tructures Wound Bed Granulation Amount: None Present (0%) Exposed Structure Necrotic Amount: None Present (0%) Fascia Exposed: No Fat Layer (Subcutaneous Tissue) Exposed: No Tendon Exposed: No Muscle Exposed: No Joint Exposed: No Bone Exposed: No Periwound Skin Texture Texture Color No Abnormalities Noted: No No  Abnormalities Noted: No Callus: No Atrophie Blanche: No Crepitus: No Cyanosis: No Excoriation: No Ecchymosis: No Induration: No Erythema: No Rash: No Hemosiderin Staining: No Scarring: No Mottled: No Pallor: No Moisture Rubor: No No Abnormalities Noted: No Dry / Scaly: No Maceration: No [name], [name] ([id number]) [id number] Page 6 of 8 [name] Notes Wound #12 (Lower Leg) Wound Laterality: Left, Anterior Cleanser Peri-Wound Care Topical Primary Dressing Secondary Dressing Secured With Compression Wrap Compression Stockings Add-Ons Electronic Signature(s) Signed: 11/04/2021 11:12:45 AM By: Valeria Batman EMT Signed: 11/04/2021 5:00:15 PM By: [name] Pilling RN, BSN Previous Signature: 11/03/2021 6:57:47 PM Version By: [name] Pilling RN, BSN Entered By: Valeria Batman on 11/04/2021 11:12:45 -------------------------------------------------------------------------------- Wound Assessment Details Patient Name: Date of Service: [name], [name] [name]. 11/03/2021 3:00 PM Medical Record Number: [id number] Patient Account Number: [id number] Date of Birth/Sex: Treating RN: [birth year] (66 y.o. [name], [name] Primary Care Pasqualino Witherspoon: [name] Other Clinician: Referring Kiyara Bouffard: Treating Mirielle Byrum/Extender: [name]: 13 Wound Status Wound Number: 7 Primary Venous Leg Ulcer Etiology: Wound Location: Right, Medial Malleolus Secondary Lymphedema Wounding Event: Blister Etiology: Date Acquired: 07/21/2021 Wound Open Weeks Of [name]: 13 Status: Clustered Wound: No Comorbid Sleep Apnea, Arrhythmia, Congestive Heart Failure, History: Hypertension, Hypotension, Peripheral Arterial Disease, Peripheral Venous Disease, Type II Diabetes, Gout, Osteoarthritis Wound Measurements Length: (cm) 4.4 Width: (cm) 3.8 Depth: (cm) 0.2 Area: (cm) 13.132 Volume: (cm) 2.626 % Reduction in Area: 65.7% % Reduction in  Volume: 90.2% Epithelialization: Medium (34-66%) Tunneling: No Undermining: No Wound Description Classification: Full Thickness With Exposed Suppo Wound Margin: Distinct, outline attached Exudate Amount: Medium Exudate Type: Serosanguineous Exudate Color: red, brown rt Structures Foul Odor After Cleansing: No Slough/Fibrino Yes Wound Bed Granulation Amount: Medium (34-66%) Exposed Structure Granulation Quality: Red, Pink Fascia Exposed: No Necrotic Amount: Medium (34-66%) Fat Layer (Subcutaneous Tissue) Exposed: Yes Necrotic Quality: Adherent Slough Tendon Exposed: No Muscle Exposed: No Joint Exposed: No [name], [name] ([id number]) [id number] Page 7 of 8 Bone Exposed: No Periwound Skin Texture Texture Color No Abnormalities Noted: No No Abnormalities Noted: No Callus: No Atrophie Blanche: No Crepitus: No Cyanosis: No Excoriation: No Ecchymosis: No Induration: No Erythema: No Rash: No Hemosiderin Staining: No Scarring: No Mottled: No Pallor: No Moisture Rubor: No No Abnormalities Noted: No Dry / Scaly: No Maceration: No [name] Notes Wound #7 (Malleolus) Wound Laterality: Right, Medial Cleanser Peri-Wound Care Triamcinolone 15 (g) Discharge Instruction: Use triamcinolone 15 (g) as directed Sween Lotion (Moisturizing lotion) Discharge Instruction: Apply moisturizing lotion as directed Topical Keystone topical compounding antibiotics Discharge Instruction: **HOLD FOR THIS WEEK**apply directly to wound bed. Primary Dressing Promogran Prisma Matrix, 4.34 (sq in) (silver collagen) Discharge Instruction: Moisten collagen with saline or hydrogel Secondary Dressing ABD Pad, 8x10 Discharge Instruction: Apply over primary dressing as directed. Secured With Compression Wrap ThreePress (3 layer compression wrap) Discharge Instruction: Apply three layer use kerlix instead of cotton compression as directed. Compression  Stockings Add-Ons Electronic Signature(s) Signed: 11/03/2021 6:57:47 PM By: [name] Pilling RN, BSN Entered By: [name] on 11/03/2021 15:50:48 -------------------------------------------------------------------------------- Vitals Details Patient Name: Date of Service: Mcclaine, [name] [name]. 11/03/2021 3:00 PM Medical Record Number: [id number] Patient Account Number: [id number] Date of Birth/Sex: Treating RN: [birth year] (  66 y.o. [name] Primary Care Diego Delancey: [name] Other Clinician: Referring Benjimin Hadden: Treating Kalab Camps/Extender: [name]: 13 Vital Signs Time Taken: 15:40 Temperature (F): 97.9 Height (in): 71 Pulse (bpm): 91 [place], Torrie [name] ([id number]) [phone].pdf Page 8 of 8 Weight (lbs): 350 Respiratory Rate (breaths/min): 20 Body Mass Index (BMI): 48.8 Blood Pressure (mmHg): 148/97 Reference Range: 80 - 120 mg / dl Electronic Signature(s) Signed: 11/03/2021 6:57:47 PM By: [name] Pilling RN, BSN Entered By: [name] on 11/03/2021 15:41:41

## 2021-11-06 DIAGNOSIS — T148XXA Other injury of unspecified body region, initial encounter: Secondary | ICD-10-CM | POA: Diagnosis not present

## 2021-11-06 DIAGNOSIS — G4733 Obstructive sleep apnea (adult) (pediatric): Secondary | ICD-10-CM | POA: Diagnosis not present

## 2021-11-06 DIAGNOSIS — L089 Local infection of the skin and subcutaneous tissue, unspecified: Secondary | ICD-10-CM | POA: Diagnosis not present

## 2021-11-06 DIAGNOSIS — R5381 Other malaise: Secondary | ICD-10-CM | POA: Diagnosis not present

## 2021-11-09 NOTE — Telephone Encounter (Signed)
Advanced Heart Failure Patient Advocate Encounter  [employer] is also requesting POI. Left voicemail on patient phone 11/09/2021 requesting POI records for both assistance programs.

## 2021-11-09 NOTE — Telephone Encounter (Signed)
Advanced Heart Failure Patient Advocate Encounter  [employer] is requesting POI. Left message on patient voicemail.

## 2021-11-10 ENCOUNTER — Encounter (HOSPITAL_BASED_OUTPATIENT_CLINIC_OR_DEPARTMENT_OTHER): Payer: Medicare Other | Attending: Physician Assistant | Admitting: Physician Assistant

## 2021-11-10 DIAGNOSIS — E11621 Type 2 diabetes mellitus with foot ulcer: Secondary | ICD-10-CM | POA: Diagnosis not present

## 2021-11-10 DIAGNOSIS — L97828 Non-pressure chronic ulcer of other part of left lower leg with other specified severity: Secondary | ICD-10-CM | POA: Insufficient documentation

## 2021-11-10 DIAGNOSIS — I87333 Chronic venous hypertension (idiopathic) with ulcer and inflammation of bilateral lower extremity: Secondary | ICD-10-CM | POA: Insufficient documentation

## 2021-11-10 DIAGNOSIS — L97818 Non-pressure chronic ulcer of other part of right lower leg with other specified severity: Secondary | ICD-10-CM | POA: Diagnosis not present

## 2021-11-10 DIAGNOSIS — I890 Lymphedema, not elsewhere classified: Secondary | ICD-10-CM | POA: Diagnosis not present

## 2021-11-10 DIAGNOSIS — L97518 Non-pressure chronic ulcer of other part of right foot with other specified severity: Secondary | ICD-10-CM | POA: Diagnosis not present

## 2021-11-10 DIAGNOSIS — L97312 Non-pressure chronic ulcer of right ankle with fat layer exposed: Secondary | ICD-10-CM | POA: Diagnosis not present

## 2021-11-10 DIAGNOSIS — I872 Venous insufficiency (chronic) (peripheral): Secondary | ICD-10-CM | POA: Diagnosis not present

## 2021-11-10 NOTE — Progress Notes (Addendum)
LORENA, CLEARMAN ([id number]) [id number] Page 1 of 9 Visit Report for 11/10/2021 Chief Complaint Document Details Patient Name: Date of Service: [name], [name] 11/10/2021 3:00 PM Medical Record Number: [id number] Patient Account Number: [id number] Date of Birth/Sex: Treating RN: June 19, 1955 (66 y.o. [name] Diener Primary Care Provider: [name] Other Clinician: Referring Provider: Treating Provider/Extender: [name] in Treatment: 14 Information Obtained from: Patient Chief Complaint 08/04/2021; patient returns to clinic with bilateral leg wounds as well as areas on the right foot Electronic Signature(s) Signed: 11/10/2021 3:15:46 PM By: [name] PA-C Entered By: [name] on 11/10/2021 15:15:46 -------------------------------------------------------------------------------- Debridement Details Patient Name: Date of Service: [name], [name] 11/10/2021 3:00 PM Medical Record Number: [id number] Patient Account Number: [id number] Date of Birth/Sex: Treating RN: [birth year] (66 y.o. [name], [name] Primary Care Provider: [name] Other Clinician: Referring Provider: Treating Provider/Extender: [name] in Treatment: 14 Debridement Performed for Assessment: Wound #7 Right,Medial Malleolus Performed By: Physician [name], PA Debridement Type: Debridement Severity of Tissue Pre Debridement: Fat layer exposed Level of Consciousness (Pre-procedure): Awake and Alert Pre-procedure Verification/Time Out Yes - 16:20 Taken: Start Time: 16:21 Pain Control: Lidocaine 5% topical ointment T Area Debrided (L x W): otal 4.4 (cm) x 3 (cm) = 13.2 (cm) Tissue and other material debrided: Viable, Non-Viable, Slough, Subcutaneous, Skin: Dermis , Skin: Epidermis, Slough Level: Skin/Subcutaneous Tissue Debridement Description: Excisional Instrument: Curette Bleeding: Minimum End Time:  16:24 Procedural Pain: 0 Post Procedural Pain: 0 Response to Treatment: Procedure was tolerated well Level of Consciousness (Post- Awake and Alert procedure): Post Debridement Measurements of Total Wound Length: (cm) 4.4 Width: (cm) 3 Depth: (cm) 0.2 Volume: (cm) 2.073 Character of Wound/Ulcer Post Debridement: Requires Further Debridement Severity of Tissue Post Debridement: Fat layer exposed [name], [name] ([id number]) [id number] Page 2 of 9 Post Procedure Diagnosis Same as Pre-procedure Electronic Signature(s) Signed: 11/10/2021 6:14:51 PM By: [name] PA-C Signed: 11/12/2021 5:25:29 PM By: Deon Pilling RN, BSN Entered By: Deon Pilling on 11/10/2021 16:25:17 -------------------------------------------------------------------------------- HPI Details Patient Name: Date of Service: [name], [name] 11/10/2021 3:00 PM Medical Record Number: [id number] Patient Account Number: [id number] Date of Birth/Sex: Treating RN: Nov 21, 1955 (66 y.o. [name] Diener Primary Care Provider: [name] Other Clinician: Referring Provider: Treating Provider/Extender: [name] in Treatment: 14 History of Present Illness HPI Description: ADMISSION 03/22/2021 This is a 66 year old man with a past medical history significant for diabetes type 2, congestive heart failure, peripheral arterial disease, morbid obesity, venous insufficiency, and coronary artery disease. He has been followed by [name] in podiatry, who performed a transmetatarsal amputation on the left foot in August 2022. He had issues healing that wound, but based upon Dr. [name] notes, ultimately the TMA wound healed. During his recovery from that surgery, however, ulcers opened up over the DIP joint of the right second and third toe. These have apparently closed and reopened multiple times. It sounds like one of the issues has been moisture accumulation and maceration  of the tissues causing them to reopen. At his last visit with [name], on March 01, 2021, there continues to be problems with moisture and he was referred to wound care for further evaluation and management. He had a formal aortogram with runoff performed prior to his TMA. The findings are copied here: Patient has inline flow to both feet with no significant flow-limiting lesion that would be amenable to percutaneous or  open revascularization. He does have an element of small vessel disease and has a short segment occlusion of the distal anterior tibial/dorsalis pedis artery on the left foot but does have posterior tibial artery flow. Would recommend management of wounds with amputation of toes 2 and 3 on the right foot if the wounds do not heal and deteriorate. Transmetatarsal amputation on the left side has as good a blood supply as it is going to get and hopefully this will heal in the future. Formal ABIs were done in January 2023. They are normal bilaterally. ABI Findings: +---------+------------------+-----+----------+--------+ Right Rt Pressure (mmHg)IndexWaveform Comment  +---------+------------------+-----+----------+--------+ Brachial 160     +---------+------------------+-----+----------+--------+ PTA 192 1.06 monophasic  +---------+------------------+-----+----------+--------+ DP 159 0.88 monophasic  +---------+------------------+-----+----------+--------+ Great T oe145 0.80    +---------+------------------+-----+----------+--------+ +--------+------------------+-----+---------+-------+ Left Lt Pressure (mmHg)IndexWaveform Comment +--------+------------------+-----+---------+-------+ [id number]     +--------+------------------+-----+---------+-------+ PTA 204 1.13 triphasic  +--------+------------------+-----+---------+-------+ DP 194 1.07 biphasic    +--------+------------------+-----+---------+-------+ +-------+-----------+-----------+------------+------------+ ABI/TBIT oday's ABIT oday's TBIPrevious ABIPrevious TBI +-------+-----------+-----------+------------+------------+ Right 1.06 0.8 1.26 0.65  +-------+-----------+-----------+------------+------------+ Left 1.13 amputation 1.15 amputation  +-------+-----------+-----------+------------+------------+ Previous ABI on 08/06/20 at [facility] Pedal pressures falsely elevated due to medial calcification. SummaryNYZIR, DUBOIS ([id number]) [id number] Page 3 of 9 Right: Resting right ankle-brachial index is within normal range. The right toe-brachial index is normal. Left: Resting left ankle-brachial index is within normal range. READMISSION 08/04/2021 Mr. Araki is now a 66 year old man who I remember from this clinic many years ago I think he had a right lower extremity predominantly venous wound at the time. He was here for 1 visit in March of this year had wounds on his right second and third toes we apparently dressed them many and they healed so he did not come back. He is listed in [place] is being a diabetic although the patient denies this says he is verified it with his primary doctor. In any case over the last several weeks or so according the patient although these wounds look somewhat more chronic than that he has developed predominantly large wounds on the right medial and right lateral ankle smaller areas on the left leg and areas on the dorsal aspect of his right second and third toes. Its not clear how he has been dressing these. 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 The patient has known PAD. He had an angiogram in August 2022 at which time he had nonhealing wounds in both feet. On the left his major vessels in the thigh were all patent. He  had three-vessel patent to the level of the ankle. He had a very short occlusion in the left anterior tibial. On the right lower extremity the major vessels in his thigh were all patent. He had three-vessel runoff to the foot sluggish filling of the anterior tibial artery. He was felt to have some component of small vessel disease but nothing that was amenable or needed revascularization. It was recommended that he have amputation of the second and third toes on the right foot if they did not heal He has been following with [name] of podiatry. [name] got him juxta lite stockings although I do not think he had them on properly he has uncontrolled edema in both legs Past medical history includes type 2 diabetes [although the patient really denies this], left TMA in 2022,lower extremity wounds in fact attendance at this clinic in 2009-2010, A-fib on Eliquis, chronic venous insufficiency with secondary  lymphedema history of non-Hodgkin's lymphoma. 08-11-2021 upon evaluation today patient presents for follow-up evaluation he was seen last Wednesday initially for inspection here in our clinic. With that being said he tells me that he unfortunately has been having a lot of drainage and is actually coming through his wrap. Fortunately I do not see any evidence of active infection locally or systemically at this time which is great news. No fevers, chills, nausea, vomiting, or diarrhea. With that being said there does appear to be some evidence of local infection based on what I am seeing today. 08-18-2021 upon evaluation today patient appears to be doing okay currently in regard to his wounds with that being said that he is doing much better but still has a long ways to go to get to where he wanted to be. I think the infection is significantly improved. He has another week of the antibiotic at this point. 08-25-2021 upon evaluation today patient's wounds are actually doing decently well he has erythema has  significantly improved. I think the cellulitis is under controlled I am going to continue him on 2 more weeks of the Levaquin at 500 mg this is a lower dose but I am hoping it will be better for him. 09-08-2021 upon evaluation today patient appears to be doing excellent in regard to his wounds. Since I last saw him he was actually in the hospital where he ended up having a pacemaker put in. Subsequently he tells me that he is actually doing quite well although they were unsure whether they were going to do it due to the fact that he had the wounds on his legs. And then I am glad they did anything seem to be doing well. 09-15-2021 upon evaluation patient's wounds are actually showing signs of improvement. The right side wounds do appear to have some need for sharp debridement today and I [name] go ahead and proceed with that. I think that if we get the wounds cleaned up he will actually show signs of continued improvement. I am also leaning towards switching to [facility] which I think will be a much better option for him. 09-22-2021 patient's wounds are showing signs of excellent improvement. I am actually extremely pleased with where we stand and I think that the patient is making great progress. There does not appear to be any signs of active infection. 09-29-2021 upon evaluation today patient appears to be doing excellent in regard to his wounds. He is actually tolerating the dressing changes without complication. Fortunately I see no evidence of active infection locally or systemically at this time which is great news and overall I am extremely pleased with where we stand currently. 09-2721 upon evaluation today patient actually appears to be doing excellent in regard to his wounds. The left leg is almost completely healed the right ankle is significantly smaller. Overall I am extremely pleased with where we stand at this point. I do not see any evidence of active infection at this time. 10-13-2021  upon evaluation today patient appears to be doing excellent in regard to his wounds. I really feel like he is making good progress here and I am very pleased in that regard. Fortunately I do not see any signs of active infection at this time. We are using the [facility] topical antibiotic therapy. 10-20-2021 upon evaluation today patient appears to be doing well with regard to his wound on the right medial ankle region the left leg is almost completely healed. I am actually very pleased  with where we stand today. 11-03-2021 upon evaluation today patient's wound actually is going require some sharp debridement but appears to be doing much better which is great news. Fortunately I do not see any signs of active infection at this time. 11-10-2021 upon evaluation today patient's wound is actually showing signs of excellent improvement. Fortunately I do not see any evidence of infection locally or systemically which is great news and overall I am extremely pleased with where we stand today. I do believe he is making good progress he does have his Keystone topical antibiotics with him here today. Electronic Signature(s) Signed: 11/10/2021 5:40:54 PM By: [name] PA-C Entered By: [name] on 11/10/2021 17:40:54 -------------------------------------------------------------------------------- Physical Exam Details Patient Name: Date of Service: [name], [name] 11/10/2021 3:00 PM Medical Record Number: [id number] Patient Account Number: [id number] Date of Birth/Sex: Treating RN: Jan 22, 1955 (66 y.o. [name], [name] ([id number]) [id number] Page 4 of 9 Primary Care Provider: [name] Other Clinician: Referring Provider: Treating Provider/Extender: [name] in Treatment: 15 Constitutional Well-nourished and well-hydrated in no acute distress. Respiratory normal breathing without difficulty. Psychiatric this patient is  able to make decisions and demonstrates good insight into disease process. Alert and Oriented x 3. pleasant and cooperative. Notes Upon inspection patient's wound bed actually showed signs of good granulation and epithelization for the most part there was some slough and biofilm noted I did perform sharp debridement of clearway necrotic debris today tolerated that without complication and postdebridement the wound bed appears to be doing much better. Electronic Signature(s) Signed: 11/10/2021 5:41:25 PM By: [name] PA-C Entered By: [name] on 11/10/2021 17:41:24 -------------------------------------------------------------------------------- Physician Orders Details Patient Name: Date of Service: [name], [name] 11/10/2021 3:00 PM Medical Record Number: [id number] Patient Account Number: [id number] Date of Birth/Sex: Treating RN: [birth year] (66 y.o. [name], [name] Primary Care Provider: [name] Other Clinician: Referring Provider: Treating Provider/Extender: [name] in Treatment: [phone] Verbal / Phone Orders: No Diagnosis Coding ICD-10 Coding Code Description I89.0 Lymphedema, not elsewhere classified I87.333 Chronic venous hypertension (idiopathic) with ulcer and inflammation of bilateral lower extremity L97.828 Non-pressure chronic ulcer of other part of left lower leg with other specified severity L97.818 Non-pressure chronic ulcer of other part of right lower leg with other specified severity E11.621 Type 2 diabetes mellitus with foot ulcer L97.518 Non-pressure chronic ulcer of other part of right foot with other specified severity Follow-up Appointments ppointment in 2 weeks. Margarita Grizzle on Wednesday Return A Nurse Visit: - 11/17/2021 Wednesday Other: [facility] topical compounding antibiotics- bring to your appointments. bring in compression stockings to weekly appts. Anesthetic (In clinic) Topical Lidocaine 5% applied to wound  bed Bathing/ Shower/ Hygiene May shower with protection but do not get wound dressing(s) wet. Edema Control - Lymphedema / SCD / Other Elevate legs to the level of the heart or above for 30 minutes daily and/or when sitting, a frequency of: - 3-4 times a day throughout the day. Avoid standing for long periods of time. Exercise regularly Moisturize legs daily. - apply every night before bed to left leg. Compression stocking or Garment 30-40 mm/Hg pressure to: - wear your VIVE compression stocking to left leg. apply in the morning and remove at night. Will apply tubigrip size [name] to left leg. Remove once you get home and use your compression stockings. Wound Treatment Wound #7 - Malleolus Wound Laterality: Right, Medial Peri-Wound Care: Triamcinolone 15 (  g) 1 x Per Week/30 Days [name], [name] ([id number]) [id number] Page 5 of 9 Discharge Instructions: Use triamcinolone 15 (g) as directed Peri-Wound Care: Sween Lotion (Moisturizing lotion) 1 x Per Week/30 Days Discharge Instructions: Apply moisturizing lotion as directed Topical: Keystone topical compounding antibiotics 1 x Per Week/30 Days Discharge Instructions: ****APPLY LIGHTLY**** directly to wound bed. Prim Dressing: Promogran Prisma Matrix, 4.34 (sq in) (silver collagen) 1 x Per Week/30 Days ary Discharge Instructions: Moisten collagen with saline or hydrogel Secondary Dressing: ABD Pad, 8x10 1 x Per Week/30 Days Discharge Instructions: Apply over primary dressing as directed. Compression Wrap: ThreePress (3 layer compression wrap) 1 x Per Week/30 Days Discharge Instructions: Apply three layer use kerlix instead of cotton compression as directed. Electronic Signature(s) Signed: 11/10/2021 6:14:51 PM By: [name] PA-C Signed: 11/12/2021 5:25:29 PM By: Deon Pilling RN, BSN Entered By: Deon Pilling on 11/10/2021  16:27:01 -------------------------------------------------------------------------------- Problem List Details Patient Name: Date of Service: [name], [name] 11/10/2021 3:00 PM Medical Record Number: [id number] Patient Account Number: [id number] Date of Birth/Sex: Treating RN: 02-16-1955 (66 y.o. [name], [name] Primary Care Provider: [name] Other Clinician: Referring Provider: Treating Provider/Extender: [name] in Treatment: 14 Active Problems ICD-10 Encounter Code Description Active Date MDM Diagnosis I89.0 Lymphedema, not elsewhere classified 08/04/2021 No Yes I87.333 Chronic venous hypertension (idiopathic) with ulcer and inflammation of 08/04/2021 No Yes bilateral lower extremity L97.828 Non-pressure chronic ulcer of other part of left lower leg with other specified 08/04/2021 No Yes severity L97.818 Non-pressure chronic ulcer of other part of right lower leg with other specified 08/04/2021 No Yes severity E11.621 Type 2 diabetes mellitus with foot ulcer 08/04/2021 No Yes L97.518 Non-pressure chronic ulcer of other part of right foot with other specified 08/04/2021 No Yes severity Inactive Problems Faron, Guerin [name] ([id number]) [id number] Page 6 of 9 Resolved Problems Electronic Signature(s) Signed: 11/10/2021 3:15:33 PM By: [name] PA-C Entered By: [name] on 11/10/2021 15:15:32 -------------------------------------------------------------------------------- Progress Note Details Patient Name: Date of Service: [name], [name] 11/10/2021 3:00 PM Medical Record Number: [id number] Patient Account Number: [id number] Date of Birth/Sex: Treating RN: 07/15/1955 (67 y.o. [name] Diener Primary Care Provider: [name] Other Clinician: Referring Provider: Treating Provider/Extender: [name] in Treatment: 14 Subjective Chief Complaint Information obtained from  Patient 08/04/2021; patient returns to clinic with bilateral leg wounds as well as areas on the right foot History of Present Illness (HPI) ADMISSION 03/22/2021 This is a 66 year old man with a past medical history significant for diabetes type 2, congestive heart failure, peripheral arterial disease, morbid obesity, venous insufficiency, and coronary artery disease. He has been followed by [name] in podiatry, who performed a transmetatarsal amputation on the left foot in August 2022. He had issues healing that wound, but based upon Dr. [name] notes, ultimately the TMA wound healed. During his recovery from that surgery, however, ulcers opened up over the DIP joint of the right second and third toe. These have apparently closed and reopened multiple times. It sounds like one of the issues has been moisture accumulation and maceration of the tissues causing them to reopen. At his last visit with [name], on March 01, 2021, there continues to be problems with moisture and he was referred to wound care for further evaluation and management. He had a formal aortogram with runoff performed prior to his TMA. The findings are copied here: Patient has inline flow to both feet with no significant flow-limiting  lesion that would be amenable to percutaneous or open revascularization. He does have an element of small vessel disease and has a short segment occlusion of the distal anterior tibial/dorsalis pedis artery on the left foot but does have posterior tibial artery flow. Would recommend management of wounds with amputation of toes 2 and 3 on the right foot if the wounds do not heal and deteriorate. Transmetatarsal amputation on the left side has as good a blood supply as it is going to get and hopefully this will heal in the future. Formal ABIs were done in January 2023. They are normal bilaterally. ABI Findings: +---------+------------------+-----+----------+--------+ Right Rt Pressure  (mmHg)IndexWaveform Comment  +---------+------------------+-----+----------+--------+ Brachial 160    +---------+------------------+-----+----------+--------+ PTA 192 1.06 monophasic  +---------+------------------+-----+----------+--------+ DP 159 0.88 monophasic  +---------+------------------+-----+----------+--------+ Great T oe145 0.80    +---------+------------------+-----+----------+--------+ +--------+------------------+-----+---------+-------+ Left Lt Pressure (mmHg)IndexWaveform Comment +--------+------------------+-----+---------+-------+ [id number]    +--------+------------------+-----+---------+-------+ PTA 204 1.13 triphasic  +--------+------------------+-----+---------+-------+ DP 194 1.07 biphasic   +--------+------------------+-----+---------+-------+ +-------+-----------+-----------+------------+------------+ ABI/TBIT oday's ABIT oday's TBIPrevious ABIPrevious TBI +-------+-----------+-----------+------------+------------+ Right 1.06 0.8 1.26 0.65  +-------+-----------+-----------+------------+------------+ Left 1.13 amputation 1.15 amputation  +-------+-----------+-----------+------------+------------+ Previous ABI on 08/06/20 at [place] [place] [name] ([id number]) [id number].pdf Page 7 of 9 Pedal pressures falsely elevated due to medial calcification. Summary: Right: Resting right ankle-brachial index is within normal range. The right toe-brachial index is normal. Left: Resting left ankle-brachial index is within normal range. READMISSION 08/04/2021 Mr. Taft is now a 66 year old man who I remember from this clinic many years ago I think he had a right lower extremity predominantly venous wound at the time. He was here for 1 visit in March of this year had wounds on his right second and third toes we apparently dressed them many and they healed so he did not come back.  He is listed in [place] is being a diabetic although the patient denies this says he is verified it with his primary doctor. In any case over the last several weeks or so according the patient although these wounds look somewhat more chronic than that he has developed predominantly large wounds on the right medial and right lateral ankle smaller areas on the left leg and areas on the dorsal aspect of his right second and third toes. Its not clear how he has been dressing these. 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 The patient has known PAD. He had an angiogram in August 2022 at which time he had nonhealing wounds in both feet. On the left his major vessels in the thigh were all patent. He had three-vessel patent to the level of the ankle. He had a very short occlusion in the left anterior tibial. On the right lower extremity the major vessels in his thigh were all patent. He had three-vessel runoff to the foot sluggish filling of the anterior tibial artery. He was felt to have some component of small vessel disease but nothing that was amenable or needed revascularization. It was recommended that he have amputation of the second and third toes on the right foot if they did not heal He has been following with [name] of podiatry. [name] got him juxta lite stockings although I do not think he had them on properly he has uncontrolled edema in both legs Past medical history includes type 2 diabetes [although the patient really denies this], left TMA in 2022,lower extremity wounds in fact attendance at this clinic in 2009-2010, A-fib on  Eliquis, chronic venous insufficiency with secondary lymphedema history of non-Hodgkin's lymphoma. 08-11-2021 upon evaluation today patient presents for follow-up evaluation he was seen last Wednesday initially for inspection here in our clinic. With that being said he tells me that  he unfortunately has been having a lot of drainage and is actually coming through his wrap. Fortunately I do not see any evidence of active infection locally or systemically at this time which is great news. No fevers, chills, nausea, vomiting, or diarrhea. With that being said there does appear to be some evidence of local infection based on what I am seeing today. 08-18-2021 upon evaluation today patient appears to be doing okay currently in regard to his wounds with that being said that he is doing much better but still has a long ways to go to get to where he wanted to be. I think the infection is significantly improved. He has another week of the antibiotic at this point. 08-25-2021 upon evaluation today patient's wounds are actually doing decently well he has erythema has significantly improved. I think the cellulitis is under controlled I am going to continue him on 2 more weeks of the Levaquin at 500 mg this is a lower dose but I am hoping it will be better for him. 09-08-2021 upon evaluation today patient appears to be doing excellent in regard to his wounds. Since I last saw him he was actually in the hospital where he ended up having a pacemaker put in. Subsequently he tells me that he is actually doing quite well although they were unsure whether they were going to do it due to the fact that he had the wounds on his legs. And then I am glad they did anything seem to be doing well. 09-15-2021 upon evaluation patient's wounds are actually showing signs of improvement. The right side wounds do appear to have some need for sharp debridement today and I [name] go ahead and proceed with that. I think that if we get the wounds cleaned up he will actually show signs of continued improvement. I am also leaning towards switching to [facility] which I think will be a much better option for him. 09-22-2021 patient's wounds are showing signs of excellent improvement. I am actually extremely pleased with  where we stand and I think that the patient is making great progress. There does not appear to be any signs of active infection. 09-29-2021 upon evaluation today patient appears to be doing excellent in regard to his wounds. He is actually tolerating the dressing changes without complication. Fortunately I see no evidence of active infection locally or systemically at this time which is great news and overall I am extremely pleased with where we stand currently. 09-2721 upon evaluation today patient actually appears to be doing excellent in regard to his wounds. The left leg is almost completely healed the right ankle is significantly smaller. Overall I am extremely pleased with where we stand at this point. I do not see any evidence of active infection at this time. 10-13-2021 upon evaluation today patient appears to be doing excellent in regard to his wounds. I really feel like he is making good progress here and I am very pleased in that regard. Fortunately I do not see any signs of active infection at this time. We are using the [facility] topical antibiotic therapy. 10-20-2021 upon evaluation today patient appears to be doing well with regard to his wound on the right medial ankle region the left leg is almost completely  healed. I am actually very pleased with where we stand today. 11-03-2021 upon evaluation today patient's wound actually is going require some sharp debridement but appears to be doing much better which is great news. Fortunately I do not see any signs of active infection at this time. 11-10-2021 upon evaluation today patient's wound is actually showing signs of excellent improvement. Fortunately I do not see any evidence of infection locally or systemically which is great news and overall I am extremely pleased with where we stand today. I do believe he is making good progress he does have his Keystone topical antibiotics with him here today. Objective Constitutional Well-nourished  and well-hydrated in no acute distress. Vitals Time Taken: 3:35 PM, Height: 71 in, Weight: 350 lbs, BMI: 48.8, Temperature: 97.7 F, Pulse: 75 bpm, Respiratory Rate: 20 breaths/min, Blood Pressure: 152/85 mmHg. Respiratory RAKEEN, GAILLARD ([id number]) [id number] Page 8 of 9 normal breathing without difficulty. Psychiatric this patient is able to make decisions and demonstrates good insight into disease process. Alert and Oriented x 3. pleasant and cooperative. General Notes: Upon inspection patient's wound bed actually showed signs of good granulation and epithelization for the most part there was some slough and biofilm noted I did perform sharp debridement of clearway necrotic debris today tolerated that without complication and postdebridement the wound bed appears to be doing much better. Integumentary (Hair, Skin) Wound #7 status is Open. Original cause of wound was Blister. The date acquired was: 07/21/2021. The wound has been in treatment 14 weeks. The wound is located on the Right,Medial Malleolus. The wound measures 4.4cm length x 3cm width x 0.2cm depth; 10.367cm^2 area and 2.073cm^3 volume. There is Fat Layer (Subcutaneous Tissue) exposed. There is no tunneling or undermining noted. There is a medium amount of serosanguineous drainage noted. The wound margin is distinct with the outline attached to the wound base. There is medium (34-66%) red, pink granulation within the wound bed. There is a medium (34-66%) amount of necrotic tissue within the wound bed including Adherent Slough. The periwound skin appearance exhibited: Dry/Scaly. The periwound skin appearance did not exhibit: Callus, Crepitus, Excoriation, Induration, Rash, Scarring, Maceration, Atrophie Blanche, Cyanosis, Ecchymosis, Hemosiderin Staining, Mottled, Pallor, Rubor, Erythema. Assessment Active Problems ICD-10 Lymphedema, not elsewhere classified Chronic venous hypertension (idiopathic) with  ulcer and inflammation of bilateral lower extremity Non-pressure chronic ulcer of other part of left lower leg with other specified severity Non-pressure chronic ulcer of other part of right lower leg with other specified severity Type 2 diabetes mellitus with foot ulcer Non-pressure chronic ulcer of other part of right foot with other specified severity Procedures Wound #7 Pre-procedure diagnosis of Wound #7 is a Venous Leg Ulcer located on the Right,Medial Malleolus .Severity of Tissue Pre Debridement is: Fat layer exposed. There was a Excisional Skin/Subcutaneous Tissue Debridement with a total area of 13.2 sq cm performed by [name], PA. With the following instrument(s): Curette to remove Viable and Non-Viable tissue/material. Material removed includes Subcutaneous Tissue, Slough, Skin: Dermis, and Skin: Epidermis after achieving pain control using Lidocaine 5% topical ointment. A time out was conducted at 16:20, prior to the start of the procedure. A Minimum amount of bleeding was controlled with N/A. The procedure was tolerated well with a pain level of 0 throughout and a pain level of 0 following the procedure. Post Debridement Measurements: 4.4cm length x 3cm width x 0.2cm depth; 2.073cm^3 volume. Character of Wound/Ulcer Post Debridement requires further debridement. Severity of Tissue Post Debridement is: Fat layer exposed. Post procedure  Diagnosis Wound #7: Same as Pre-Procedure Pre-procedure diagnosis of Wound #7 is a Venous Leg Ulcer located on the Right,Medial Malleolus . There was a Three Layer Compression Therapy Procedure by Deon Pilling, RN. Post procedure Diagnosis Wound #7: Same as Pre-Procedure Plan Follow-up Appointments: Return Appointment in 2 weeks. Margarita Grizzle on Wednesday Nurse Visit: - 11/17/2021 Wednesday Other: [facility] topical compounding antibiotics- bring to your appointments. bring in compression stockings to weekly appts. Anesthetic: (In  clinic) Topical Lidocaine 5% applied to wound bed Bathing/ Shower/ Hygiene: May shower with protection but do not get wound dressing(s) wet. Edema Control - Lymphedema / SCD / Other: Elevate legs to the level of the heart or above for 30 minutes daily and/or when sitting, a frequency of: - 3-4 times a day throughout the day. Avoid standing for long periods of time. Exercise regularly Moisturize legs daily. - apply every night before bed to left leg. Compression stocking or Garment 30-40 mm/Hg pressure to: - wear your VIVE compression stocking to left leg. apply in the morning and remove at night. Will apply tubigrip size [name] to left leg. Remove once you get home and use your compression stockings. WOUND #7: - Malleolus Wound Laterality: Right, Medial Peri-Wound Care: Triamcinolone 15 (g) 1 x Per Week/30 Days Discharge Instructions: Use triamcinolone 15 (g) as directed Peri-Wound Care: Sween Lotion (Moisturizing lotion) 1 x Per Week/30 Days Discharge Instructions: Apply moisturizing lotion as directed Topical: Keystone topical compounding antibiotics 1 x Per Week/30 Days Discharge Instructions: ****APPLY LIGHTLY**** directly to wound bed. Prim Dressing: Promogran Prisma Matrix, 4.34 (sq in) (silver collagen) 1 x Per Week/30 Days ary Discharge Instructions: Moisten collagen with saline or hydrogel Secondary Dressing: ABD Pad, 8x10 1 x Per Week/30 Days [name], DOBEK ([id number]) [id number] Page 9 of 9 Discharge Instructions: Apply over primary dressing as directed. Compression Wrap: ThreePress (3 layer compression wrap) 1 x Per Week/30 Days Discharge Instructions: Apply three layer use kerlix instead of cotton compression as directed. 1. I am good have the patient continue to monitor for any signs of worsening obviously right now we are doing a compression wrap and that seems to be doing excellent for him. 2. I am also going to recommend that we have the patient  continue with the [facility] also utilizing the [facility] topical antibiotics which I think is doing a great job here. We will see patient back for reevaluation in 1 week here in the clinic. If anything worsens or changes patient will contact our office for additional recommendations. Electronic Signature(s) Signed: 11/10/2021 5:45:44 PM By: [name] PA-C Entered By: [name] on 11/10/2021 17:45:44 -------------------------------------------------------------------------------- SuperBill Details Patient Name: Date of Service: Stepanian, [name] 11/10/2021 Medical Record Number: [id number] Patient Account Number: [id number] Date of Birth/Sex: Treating RN: 09/26/1955 (66 y.o. [name], [name] Primary Care Provider: [name] Other Clinician: Referring Provider: Treating Provider/Extender: [name] in Treatment: 14 Diagnosis Coding ICD-10 Codes Code Description I89.0 Lymphedema, not elsewhere classified I87.333 Chronic venous hypertension (idiopathic) with ulcer and inflammation of bilateral lower extremity L97.828 Non-pressure chronic ulcer of other part of left lower leg with other specified severity L97.818 Non-pressure chronic ulcer of other part of right lower leg with other specified severity E11.621 Type 2 diabetes mellitus with foot ulcer L97.518 Non-pressure chronic ulcer of other part of right foot with other specified severity Facility Procedures : 3 CPT4 Code: 1601093 Description: 11042 - DEB SUBQ TISSUE 20 SQ CM/< ICD-10 Diagnosis Description L97.818  Non-pressure chronic ulcer of other part of right lower leg with other specified Modifier: severity Quantity: 1 Physician Procedures : CPT4 Code Description Modifier [id number] 11042 - WC PHYS SUBQ TISS 20 SQ CM ICD-10 Diagnosis Description L97.818 Non-pressure chronic ulcer of other part of right lower leg with other specified severity Quantity: 1 Electronic Signature(s) Signed:  11/10/2021 5:57:30 PM By: [name] PA-C Entered By: [name] on 11/10/2021 17:57:29

## 2021-11-11 ENCOUNTER — Ambulatory Visit
Admission: RE | Admit: 2021-11-11 | Discharge: 2021-11-11 | Disposition: A | Discharge status: Home / Self Care | Payer: Medicare Other | Source: Ambulatory Visit | Attending: Pulmonary Disease | Admitting: Pulmonary Disease

## 2021-11-11 DIAGNOSIS — D3502 Benign neoplasm of left adrenal gland: Secondary | ICD-10-CM | POA: Diagnosis not present

## 2021-11-11 DIAGNOSIS — I700 Atherosclerosis of aorta: Secondary | ICD-10-CM | POA: Diagnosis not present

## 2021-11-11 DIAGNOSIS — J849 Interstitial pulmonary disease, unspecified: Secondary | ICD-10-CM

## 2021-11-11 DIAGNOSIS — J479 Bronchiectasis, uncomplicated: Secondary | ICD-10-CM | POA: Diagnosis not present

## 2021-11-11 DIAGNOSIS — R918 Other nonspecific abnormal finding of lung field: Secondary | ICD-10-CM | POA: Diagnosis not present

## 2021-11-13 NOTE — Progress Notes (Signed)
ELAZAR, ARGABRIGHT ([id number]) [id number] Page 1 of 6 Visit Report for 11/10/2021 Arrival Information Details Patient Name: Date of Service: [name], [name] 11/10/2021 3:00 PM Medical Record Number: [id number] Patient Account Number: [id number] Date of Birth/Sex: Treating RN: 05/21/1955 (66 y.o. [name], [name] Primary Care Adham Johnson: [name] Other Clinician: Referring Bora Broner: Treating Bessie Boyte/Extender: [name] in Treatment: 14 Visit Information History Since Last Visit Added or deleted any medications: No Patient Arrived: Gilford Rile Any new allergies or adverse reactions: No Arrival Time: 15:38 Had a fall or experienced change in No Accompanied By: friend activities of daily living that may affect Transfer Assistance: None risk of falls: Patient Identification Verified: Yes Signs or symptoms of abuse/neglect since last visito No Secondary Verification Process Completed: Yes Hospitalized since last visit: No Patient Requires Transmission-Based Precautions: No Implantable device outside of the clinic excluding No Patient Has Alerts: Yes cellular tissue based products placed in the center Patient Alerts: Patient on Blood Thinner since last visit: 01/2021 ABI L 1.13 R 1.06 Has Dressing in Place as Prescribed: Yes 01/2021 TBI L amp R 0.8 Has Compression in Place as Prescribed: Yes Pain Present Now: No Electronic Signature(s) Signed: 11/12/2021 5:25:29 PM By: Deon Pilling RN, BSN Entered By: Deon Pilling on 11/10/2021 15:38:43 -------------------------------------------------------------------------------- Compression Therapy Details Patient Name: Date of Service: [name], [name] 11/10/2021 3:00 PM Medical Record Number: [id number] Patient Account Number: [id number] Date of Birth/Sex: Treating RN: Dec 09, 1955 (66 y.o. [name] Primary Care Stewart Pimenta: [name] Other Clinician: Referring Eldridge Marcott: Treating  Dhruvan Gullion/Extender: [name] in Treatment: 14 Compression Therapy Performed for Wound Assessment: Wound #7 Right,Medial Malleolus Performed By: Clinician Deon Pilling, RN Compression Type: Three Layer Post Procedure Diagnosis Same as Pre-procedure Electronic Signature(s) Signed: 11/12/2021 5:25:29 PM By: Deon Pilling RN, BSN Entered By: Deon Pilling on 11/10/2021 16:25:32 [name], [name] ([id number]) [id number] Page 2 of 6 -------------------------------------------------------------------------------- Encounter Discharge Information Details Patient Name: Date of Service: KEEAN, WILMETH 11/10/2021 3:00 PM Medical Record Number: [id number] Patient Account Number: [id number] Date of Birth/Sex: Treating RN: 12-Jun-1955 (66 y.o. [name] Primary Care Goldy Calandra: [name] Other Clinician: Referring Odelle Kosier: Treating Onetta Spainhower/Extender: [name] in Treatment: 14 Encounter Discharge Information Items Post Procedure Vitals Discharge Condition: Stable Temperature (F): 97.7 Ambulatory Status: Walker Pulse (bpm): 75 Discharge Destination: Home Respiratory Rate (breaths/min): 20 Transportation: Private Auto Blood Pressure (mmHg): 152/85 Accompanied By: Denman George Schedule Follow-up Appointment: Yes Clinical Summary of Care: Electronic Signature(s) Signed: 11/12/2021 5:25:29 PM By: Deon Pilling RN, BSN Entered By: Deon Pilling on 11/10/2021 16:29:36 -------------------------------------------------------------------------------- Lower Extremity Assessment Details Patient Name: Date of Service: [name], [name] 11/10/2021 3:00 PM Medical Record Number: [id number] Patient Account Number: [id number] Date of Birth/Sex: Treating RN: [birth year] (66 y.o. [name] Primary Care Cortez Flippen: [name] Other Clinician: Referring Donta Mcinroy: Treating Aayushi Solorzano/Extender: [name]  in Treatment: 14 Edema Assessment Assessed: [name]: No] [name]: Yes] Edema: [Left: Yes] [Right: No] Calf Left: Right: Point of Measurement: 31 cm From Medial Instep 38 cm Ankle Left: Right: Point of Measurement: 11 cm From Medial Instep 25 cm Vascular Assessment Pulses: Dorsalis Pedis Palpable: [Right:Yes] Electronic Signature(s) Signed: 11/12/2021 5:25:29 PM By: Deon Pilling RN, BSN Entered By: Deon Pilling on 11/10/2021 15:42:02 [place] Details -------------------------------------------------------------------------------- [name] ([id number]) [id number] Page 3 of 6 Patient Name: Date of Service: [name], [name] 11/10/2021 3:00 PM Medical Record Number: [id number] Patient Account  Number: [id number] Date of Birth/Sex: Treating RN: 1955/07/27 (66 y.o. [name] Primary Care Chemere Steffler: [name] Other Clinician: Referring Olubunmi Rothenberger: Treating Alayzia Pavlock/Extender: [name] in Treatment: Geronimo reviewed with physician Active Inactive Pain, Acute or Chronic Nursing Diagnoses: Pain, acute or chronic: actual or potential Potential alteration in comfort, pain Goals: Patient will verbalize adequate pain control and receive pain control interventions during procedures as needed Date Initiated: 08/04/2021 Target Resolution Date: 12/03/2021 Goal Status: Active Patient/caregiver will verbalize comfort level met Date Initiated: 08/04/2021 Target Resolution Date: 12/03/2021 Goal Status: Active Interventions: Encourage patient to take pain medications as prescribed Provide education on pain management Reposition patient for comfort Treatment Activities: Administer pain control measures as ordered : 08/04/2021 Notes: Electronic Signature(s) Signed: 11/12/2021 5:25:29 PM By: Deon Pilling RN, BSN Entered By: Deon Pilling on 11/10/2021  16:23:25 -------------------------------------------------------------------------------- Pain Assessment Details Patient Name: Date of Service: [name], [name] 11/10/2021 3:00 PM Medical Record Number: [id number] Patient Account Number: [id number] Date of Birth/Sex: Treating RN: 1955-12-31 (66 y.o. [name] Primary Care Leonardo Plaia: [name] Other Clinician: Referring Chaney Ingram: Treating Nasri Boakye/Extender: [name] in Treatment: 14 Active Problems Location of Pain Severity and Description of Pain Patient Has Paino No Site Locations Rate the pain. HIEP, OLLIS ([id number]) [id number] Page 4 of 6 Rate the pain. Current Pain Level: 0 Pain Management and Medication Current Pain Management: Medication: No Cold Application: No Rest: No Massage: No Activity: No T.E.N.S.: No Heat Application: No Leg drop or elevation: No Is the Current Pain Management Adequate: Adequate How does your wound impact your activities of daily livingo Sleep: No Bathing: No Appetite: No Relationship With Others: No Bladder Continence: No Emotions: No Bowel Continence: No Work: No Toileting: No Drive: No Dressing: No Hobbies: No Engineer, maintenance) Signed: 11/12/2021 5:25:29 PM By: Deon Pilling RN, BSN Entered By: Deon Pilling on 11/10/2021 15:39:10 -------------------------------------------------------------------------------- Patient/Caregiver Education Details Patient Name: Date of Service: [name], [name] Huger [name]. 11/1/2023andnbsp3:00 PM Medical Record Number: [id number] Patient Account Number: [id number] Date of Birth/Gender: Treating RN: 19-Feb-1955 (66 y.o. [name] Primary Care Physician: [name] Other Clinician: Referring Physician: Treating Physician/Extender: [name] in Treatment: 14 Education Assessment Education Provided To: Patient Education Topics Provided Pain: Handouts: A  Guide to Pain Control Methods: Explain/Verbal Responses: Reinforcements needed Electronic Signature(s) Signed: 11/12/2021 5:25:29 PM By: Deon Pilling RN, BSN Entered By: Deon Pilling on 11/10/2021 16:24:13 Durocher, [name] ([id number]) [id number] Page 5 of 6 -------------------------------------------------------------------------------- Wound Assessment Details Patient Name: Date of Service: [name], [name]. 11/10/2021 3:00 PM Medical Record Number: [id number] Patient Account Number: [id number] Date of Birth/Sex: Treating RN: [birth year] (66 y.o. [name], [name] Primary Care Cartier Washko: [name] Other Clinician: Referring Hilton Saephan: Treating [place] Stell/Extender: [name] in Treatment: 14 Wound Status Wound Number: 7 Primary Venous Leg Ulcer Etiology: Wound Location: Right, Medial Malleolus Secondary Lymphedema Wounding Event: Blister Etiology: Date Acquired: 07/21/2021 Wound Open Weeks Of Treatment: 14 Status: Clustered Wound: No Comorbid Sleep Apnea, Arrhythmia, Congestive Heart Failure, History: Hypertension, Hypotension, Peripheral Arterial Disease, Peripheral Venous Disease, Type II Diabetes, Gout, Osteoarthritis Photos Wound Measurements Length: (cm) 4.4 Width: (cm) 3 Depth: (cm) 0.2 Area: (cm) 10.367 Volume: (cm) 2.073 % Reduction in Area: 72.9% % Reduction in Volume: 92.3% Epithelialization: Medium (34-66%) Tunneling: No Undermining: No Wound Description Classification: Full Thickness With Exposed Suppo Wound Margin: Distinct, outline attached Exudate Amount: Medium Exudate Type: Serosanguineous Exudate Color: red,  brown rt Structures Foul Odor After Cleansing: No Slough/Fibrino Yes Wound Bed Granulation Amount: Medium (34-66%) Exposed Structure Granulation Quality: Red, Pink Fascia Exposed: No Necrotic Amount: Medium (34-66%) Fat Layer (Subcutaneous Tissue) Exposed: Yes Necrotic Quality: Adherent  Slough Tendon Exposed: No Muscle Exposed: No Joint Exposed: No Bone Exposed: No Periwound Skin Texture Texture Color No Abnormalities Noted: No No Abnormalities Noted: No Callus: No Atrophie Blanche: No Crepitus: No Cyanosis: No Excoriation: No Ecchymosis: No Induration: No Erythema: No Rash: No Hemosiderin Staining: No Scarring: No Mottled: No Pallor: No Moisture Rubor: No No Abnormalities Noted: No ELMUS, MATHES [name] ([id number]) [id number] Page 6 of 6 Dry / Scaly: Yes Maceration: No Treatment Notes Wound #7 (Malleolus) Wound Laterality: Right, Medial Cleanser Peri-Wound Care Triamcinolone 15 (g) Discharge Instruction: Use triamcinolone 15 (g) as directed Sween Lotion (Moisturizing lotion) Discharge Instruction: Apply moisturizing lotion as directed Topical Keystone topical compounding antibiotics Discharge Instruction: ****APPLY LIGHTLY**** directly to wound bed. Primary Dressing Promogran Prisma Matrix, 4.34 (sq in) (silver collagen) Discharge Instruction: Moisten collagen with saline or hydrogel Secondary Dressing ABD Pad, 8x10 Discharge Instruction: Apply over primary dressing as directed. Secured With Compression Wrap ThreePress (3 layer compression wrap) Discharge Instruction: Apply three layer use kerlix instead of cotton compression as directed. Compression Stockings Add-Ons Electronic Signature(s) Signed: 11/12/2021 5:25:29 PM By: Deon Pilling RN, BSN Entered By: Deon Pilling on 11/10/2021 15:44:32 -------------------------------------------------------------------------------- Vitals Details Patient Name: Date of Service: [name], [name] 11/10/2021 3:00 PM Medical Record Number: [id number] Patient Account Number: [id number] Date of Birth/Sex: Treating RN: 10/23/1955 (66 y.o. [name], [name] Primary Care Shaquoia Miers: [name] Other Clinician: Referring Manessa Buley: Treating Michole Lecuyer/Extender: [name] in Treatment: 14 Vital Signs Time Taken: 15:35 Temperature (F): 97.7 Height (in): 71 Pulse (bpm): 75 Weight (lbs): 350 Respiratory Rate (breaths/min): 20 Body Mass Index (BMI): 48.8 Blood Pressure (mmHg): 152/85 Reference Range: 80 - 120 mg / dl Electronic Signature(s) Signed: 11/12/2021 5:25:29 PM By: Deon Pilling RN, BSN Entered By: Deon Pilling on 11/10/2021 15:39:01

## 2021-11-15 DIAGNOSIS — R7989 Other specified abnormal findings of blood chemistry: Secondary | ICD-10-CM | POA: Diagnosis not present

## 2021-11-15 DIAGNOSIS — M109 Gout, unspecified: Secondary | ICD-10-CM | POA: Diagnosis not present

## 2021-11-15 DIAGNOSIS — I10 Essential (primary) hypertension: Secondary | ICD-10-CM | POA: Diagnosis not present

## 2021-11-15 DIAGNOSIS — E785 Hyperlipidemia, unspecified: Secondary | ICD-10-CM | POA: Diagnosis not present

## 2021-11-15 NOTE — Telephone Encounter (Signed)
Advanced Heart Failure Patient Advocate Encounter  Contacted [facility] for update. POI and updated OOP are needed before determination can be made.

## 2021-11-16 NOTE — Telephone Encounter (Signed)
Advanced Heart Failure Patient Advocate Encounter  Spoke to patient on the phone, he will bring POI and OOP to his AHF appointment tomorrow (11/17/2021)

## 2021-11-17 ENCOUNTER — Ambulatory Visit (HOSPITAL_COMMUNITY)
Admission: RE | Admit: 2021-11-17 | Discharge: 2021-11-17 | Disposition: A | Discharge status: Home / Self Care | Payer: Medicare Other | Source: Ambulatory Visit | Attending: Family Medicine | Admitting: Family Medicine

## 2021-11-17 ENCOUNTER — Encounter (HOSPITAL_BASED_OUTPATIENT_CLINIC_OR_DEPARTMENT_OTHER): Payer: Medicare Other | Admitting: Internal Medicine

## 2021-11-17 ENCOUNTER — Encounter (HOSPITAL_COMMUNITY): Payer: Self-pay

## 2021-11-17 VITALS — BP 150/80 | HR 85 | Wt 310.0 lb

## 2021-11-17 DIAGNOSIS — I5082 Biventricular heart failure: Secondary | ICD-10-CM | POA: Diagnosis not present

## 2021-11-17 DIAGNOSIS — I890 Lymphedema, not elsewhere classified: Secondary | ICD-10-CM | POA: Diagnosis not present

## 2021-11-17 DIAGNOSIS — G4733 Obstructive sleep apnea (adult) (pediatric): Secondary | ICD-10-CM | POA: Diagnosis not present

## 2021-11-17 DIAGNOSIS — I87333 Chronic venous hypertension (idiopathic) with ulcer and inflammation of bilateral lower extremity: Secondary | ICD-10-CM | POA: Diagnosis not present

## 2021-11-17 DIAGNOSIS — Z9221 Personal history of antineoplastic chemotherapy: Secondary | ICD-10-CM | POA: Diagnosis not present

## 2021-11-17 DIAGNOSIS — Z7982 Long term (current) use of aspirin: Secondary | ICD-10-CM | POA: Insufficient documentation

## 2021-11-17 DIAGNOSIS — I4510 Unspecified right bundle-branch block: Secondary | ICD-10-CM | POA: Insufficient documentation

## 2021-11-17 DIAGNOSIS — L97828 Non-pressure chronic ulcer of other part of left lower leg with other specified severity: Secondary | ICD-10-CM | POA: Diagnosis not present

## 2021-11-17 DIAGNOSIS — I5022 Chronic systolic (congestive) heart failure: Secondary | ICD-10-CM | POA: Diagnosis not present

## 2021-11-17 DIAGNOSIS — E11621 Type 2 diabetes mellitus with foot ulcer: Secondary | ICD-10-CM | POA: Diagnosis not present

## 2021-11-17 DIAGNOSIS — J849 Interstitial pulmonary disease, unspecified: Secondary | ICD-10-CM

## 2021-11-17 DIAGNOSIS — Z6841 Body Mass Index (BMI) 40.0 and over, adult: Secondary | ICD-10-CM | POA: Diagnosis not present

## 2021-11-17 DIAGNOSIS — R768 Other specified abnormal immunological findings in serum: Secondary | ICD-10-CM | POA: Insufficient documentation

## 2021-11-17 DIAGNOSIS — I110 Hypertensive heart disease with heart failure: Secondary | ICD-10-CM | POA: Diagnosis not present

## 2021-11-17 DIAGNOSIS — N179 Acute kidney failure, unspecified: Secondary | ICD-10-CM | POA: Diagnosis not present

## 2021-11-17 DIAGNOSIS — E785 Hyperlipidemia, unspecified: Secondary | ICD-10-CM | POA: Diagnosis not present

## 2021-11-17 DIAGNOSIS — I482 Chronic atrial fibrillation, unspecified: Secondary | ICD-10-CM | POA: Diagnosis not present

## 2021-11-17 DIAGNOSIS — L97518 Non-pressure chronic ulcer of other part of right foot with other specified severity: Secondary | ICD-10-CM | POA: Diagnosis not present

## 2021-11-17 DIAGNOSIS — E11622 Type 2 diabetes mellitus with other skin ulcer: Secondary | ICD-10-CM

## 2021-11-17 DIAGNOSIS — I2510 Atherosclerotic heart disease of native coronary artery without angina pectoris: Secondary | ICD-10-CM | POA: Diagnosis not present

## 2021-11-17 DIAGNOSIS — L97818 Non-pressure chronic ulcer of other part of right lower leg with other specified severity: Secondary | ICD-10-CM | POA: Diagnosis not present

## 2021-11-17 DIAGNOSIS — I739 Peripheral vascular disease, unspecified: Secondary | ICD-10-CM | POA: Diagnosis not present

## 2021-11-17 DIAGNOSIS — I10 Essential (primary) hypertension: Secondary | ICD-10-CM

## 2021-11-17 DIAGNOSIS — I4821 Permanent atrial fibrillation: Secondary | ICD-10-CM | POA: Diagnosis not present

## 2021-11-17 DIAGNOSIS — Z7901 Long term (current) use of anticoagulants: Secondary | ICD-10-CM | POA: Diagnosis not present

## 2021-11-17 DIAGNOSIS — I442 Atrioventricular block, complete: Secondary | ICD-10-CM | POA: Diagnosis not present

## 2021-11-17 DIAGNOSIS — Z8572 Personal history of non-Hodgkin lymphomas: Secondary | ICD-10-CM | POA: Diagnosis not present

## 2021-11-17 DIAGNOSIS — L98499 Non-pressure chronic ulcer of skin of other sites with unspecified severity: Secondary | ICD-10-CM

## 2021-11-17 LAB — BASIC METABOLIC PANEL
Anion gap: 9 (ref 5–15)
BUN: 10 mg/dL (ref 8–23)
CO2: 24 mmol/L (ref 22–32)
Calcium: 9.2 mg/dL (ref 8.9–10.3)
Chloride: 107 mmol/L (ref 98–111)
Creatinine, Ser: 0.73 mg/dL (ref 0.61–1.24)
GFR, Estimated: >60 mL/min (ref >60)
Glucose, Bld: 94 mg/dL (ref 70–99)
Potassium: 4.2 mmol/L (ref 3.5–5.1)
Sodium: 140 mmol/L (ref 135–145)

## 2021-11-17 LAB — BRAIN NATRIURETIC PEPTIDE: B Natriuretic Peptide: 505.6 pg/mL — ABNORMAL HIGH (ref 0.0–100.0)

## 2021-11-17 MED ORDER — ENTRESTO 97-103 MG PO TABS: 1.0000 | ORAL_TABLET | Freq: Two times a day (BID) | ORAL | 11 refills | Status: DC

## 2021-11-17 NOTE — Patient Instructions (Addendum)
Labs done today. We will contact you only if your labs are abnormal.  INCREASE Entresto to 97-'103mg'$  (1 tablet) by mouth 2 times daily.   TAKE TORSEMIDE '20MG'$  (1 TABLET) BY MOUTH DAILY FOR 3 DAYS, THEN GO BACK TO 1 TABLET AS NEEDED.   No other medication changes were made. Please continue all current medications as prescribed.  Genetic test has been done, this has to be sent to [place] to be processed and can take 1-2 weeks to get results back.  We will let you know the results.  Please call [place] at [phone]   Your physician recommends that you keep your scheduled follow-up appointment.   If you have any questions or concerns before your next appointment please send [place] a message through [place] or call our office at [phone].    TO LEAVE A MESSAGE FOR THE NURSE SELECT OPTION 2, PLEASE LEAVE A MESSAGE INCLUDING: YOUR NAME DATE OF BIRTH CALL BACK NUMBER REASON FOR CALL**this is important as we prioritize the call backs  YOU WILL RECEIVE A CALL BACK THE SAME DAY AS LONG AS YOU CALL BEFORE 4:00 PM   Do the following things EVERYDAY: Weigh yourself in the morning before breakfast. Write it down and keep it in a log. Take your medicines as prescribed Eat low salt foods--Limit salt (sodium) to 2000 mg per day.  Stay as active as you can everyday Limit all fluids for the day to less than 2 liters   At the [facility], you and your health needs are our priority. As part of our continuing mission to provide you with exceptional heart care, we have created designated Provider Care Teams. These Care Teams include your primary Cardiologist (physician) and Advanced Practice Providers (APPs- Physician Assistants and Nurse Practitioners) who all work together to provide you with the care you need, when you need it.   You may see any of the following providers on your designated Care Team at your next follow up: Dr Glori Bickers Dr Haynes Kerns,  NP Lyda Jester, [place] Audry Riles, PharmD   Please be sure to bring in all your medications bottles to every appointment.

## 2021-11-17 NOTE — Progress Notes (Signed)
ADVANCED HF CLINIC CONSULT NOTE   PCP: Ginger Organ., MD HF Cardiologist: Dr. Haroldine Laws  HPI: [name] is a 66 y.o.y/o male w/ h/o chronic systolic heart failure, chronic afib, OSA, HTN, HLD, type 2DM, OSA on CPAP, tobacco use and venous stasis ulcers, followed by wound clinic, recent diagnosis ILD undergoing workup with pulmonary. Also history of chemotherapy with potential cardiotoxic effects (R-CHOP for lymphoma in 2011).    Admitted in 02/2017 for acute hypoxic respiratory failure requiring intubation and ultimately tracheostomy. This was in the setting of multifocal PNA/ influenza and new onset atrial fibrillation w/ RVR. Echo showed mod LVH and severely reduced LVEF 25-30% w/ diffuse HK. RV normal. At the time, his CM was felt to be viral. Did not get cath. Per d/c summary, DCCV not pursued. Afib treated w/ rate control and Eliquis.    He followed by cardiology post hospital. There was discussion regarding outpatient DCCV, but I don't see that this was every pursued.    Repeat echo 07/2017 EF improved to 50-55%, RV mildly dilated w/ mildly reduced systolic function. He was noted to be in afib at the time of study.    08/2017, wore Zio that showed Afib, well rated controlled.    Echo 11/2018 EF 45-50%, RV mildly reduced    Unfortunately, lost to f/u by cardiology for a period of time. Had been seeing [facility] for PAD, s/p left transmetatarsal amputation.  Echo 8/23 showed EF 30-35%, severe LVH, moderately reduced RV systolic function, moderate TVR.    Admitted 8/23 with pre-syncope, ECG showed AF w/ SVR, HR 28 BPM, RBBB, had several 2 sec pauses. Beta blocker and other GDMT held with AKI and hypotension. He developed worsening periods of high-grade AV block/atrial fibrillation with slow ventricular response and lengthy pauses w/ hypotension. Underwent TVP with improvement in symptoms. R/LHC showed diffuse nonobstructive CAD, bi-V failure R>L, mild to moderate pulmonary venous  hypertension. EP consulted and he underwent BiV ICD. Hospitalization complicated by chronic venous stasis wounds. Able to transition to GDMT as AKI resolved. Discharged home with [facility] PT/OT, weight 328 lbs.  Saw pulmonary for visit 09/27/21. Evidence of ILD noted on screening chest CT. Undergoing further workup with HiRes CT, CTD serologies, and PFTs.  Today he returns for HF follow up with his friend. Overall feeling great. 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 He walks with a rolling walker due to LE wounds,  but no longer SOB walking on flat ground. He has SOB with stairs . Denies palpitations, abnormal bleeding, CP, dizziness, edema, or PND/Orthopnea. Appetite ok. No fever or chills. Weight at home 310 pounds. Taking all medications. Took torsemide x 1 dose last week with good response. Wears CPAP at night. Followed at wound center and says LE wounds healing well. Not using home oxygen, wants to return tanks.    Cardiac Studies - PYP (10/23): equivocal grade 1, H/CL = 1.06  - Echo (8/23): EF 30-35%, moderately decreased LV function, RV moderate reduced, moderate TR  - R/LHC (8/23):   Mid RCA lesion is 40% stenosed.   RPDA lesion is 95% stenosed.   Ost LM lesion is 30% stenosed.   Mid LAD lesion is 30% stenosed.   2nd Diag lesion is 50% stenosed.   Mid Cx lesion is 30% stenosed.   Ao = 109/58 (78) LV = 119/19 RA =  21 RV = 57/19 PA = 56/22 (36) PCW = 24 (v = 30) Fick cardiac output/index =  7.3/2.7 PVR = 1.8 WU Ao sat = 99% PA sat = 68%, 69% PAPi = 2.1   1. Mostly non-obstructive CAD with high-grade lesion in mid to distal RPDA 2. Nonischemic CM  3. Mild to moderate pulmonary venous HTN with evidence of RV dysfunction   Plan/Discussion: Continue medical therapy. If patient develops angina can consider PCI of PDA; otherwise medical therapy.    Past Medical History:  Diagnosis Date   Acute systolic HF (heart failure) (HCC)    Arthritis    lt foot, hips knees     Atrial fibrillation ([place])    Diabetes mellitus, new onset ([place])    Diverticulosis    Dyslipidemia    Gout attack 01/16/2012   Hypertension    Internal hemorrhoids    Lymphoma ([place])    remission for about 2 years, chemo 3 years prior   Pneumonia due to COVID-19 virus 07/2018   Pulmonary hypertension ([place])    Sepsis ([place]) 02/2017   secondary to influenza; requiring trach   Tubular adenoma of colon    Venous stasis ulcers (HCC)     Current Outpatient Medications  Medication Sig Dispense Refill   acetaminophen (TYLENOL) 325 MG tablet Take 650 mg by mouth every 6 (six) hours as needed for moderate pain.      ascorbic acid (VITAMIN C) 500 MG tablet Take 1 tablet (500 mg total) by mouth 3 (three) times daily. (Patient taking differently: Take 500 mg by mouth 2 (two) times daily.) 30 tablet 0   aspirin 81 MG chewable tablet Chew 1 tablet (81 mg total) by mouth daily. 30 tablet 11   atorvastatin (LIPITOR) 40 MG tablet TAKE 1 TABLET BY MOUTH EVERY DAY 30 tablet 11   cholecalciferol (VITAMIN D) 25 MCG (1000 UNIT) tablet Take 1 tablet (1,000 Units total) by mouth daily. 30 tablet 0   colchicine 0.6 MG tablet Take 0.6 mg by mouth 2 (two) times daily as needed.     Continuous Blood Gluc Receiver (FREESTYLE LIBRE 2 READER) DEVI 1 each by Does not apply route daily. 1 each 3   Continuous Blood Gluc Sensor (FREESTYLE LIBRE 2 SENSOR) MISC 1 each by Does not apply route daily. 1 each 3   dapagliflozin propanediol (FARXIGA) 10 MG TABS tablet Take 1 tablet (10 mg total) by mouth daily. 30 tablet 11   ELIQUIS 5 MG TABS tablet TAKE 1 TABLET BY MOUTH TWICE A DAY 180 tablet 1   Omega 3 340 MG CPDR Take 1 capsule (340 mg total) by mouth 2 (two) times daily. (Patient taking differently: Take 340 mg by mouth daily.) 60 capsule 0   omeprazole (PRILOSEC) 40 MG capsule Take 1 capsule (40 mg total) by mouth daily. 30 capsule 0   polycarbophil (FIBERCON) 625 MG tablet Take 1 tablet (625 mg total) by mouth daily. 30  tablet 0   sacubitril-valsartan (ENTRESTO) 97-103 MG Take 1 tablet by mouth 2 (two) times daily. 60 tablet 11   SANTYL 250 UNIT/GM ointment APPLY 1 APPLICATION. TOPICALLY DAILY. 90 g 0   sertraline (ZOLOFT) 50 MG tablet Take 1 tablet (50 mg total) by mouth daily. 30 tablet 0   spironolactone (ALDACTONE) 25 MG tablet Take 1 tablet (25 mg total) by mouth daily. 30 tablet 11   torsemide (DEMADEX) 20 MG tablet Take 1 tablet (20 mg total) by mouth as needed. 30 tablet 6   vitamin B-12 (CYANOCOBALAMIN) 1000 MCG tablet Take 1 tablet (1,000 mcg total) by mouth daily. 30 tablet 0  Tirzepatide (MOUNJARO [place]) Inject 1 Dose into the skin once a week. (Patient not taking: Reported on 11/17/2021)     No current facility-administered medications for this encounter.   Allergies  Allergen Reactions   Latex Hives and Swelling   Ace Inhibitors     Other reaction(s): cough   Penicillin G     Other reaction(s): rash Has tolerated amoxicillin   Social History   Socioeconomic History   Marital status: Divorced    Spouse name: Not on file   Number of children: Not on file   Years of education: Not on file   Highest education level: Not on file  Occupational History   Occupation: cosmetologist  Tobacco Use   Smoking status: Former    Packs/day: 0.50    Years: 20.00    Total pack years: 10.00    Types: Cigarettes    Quit date: 01/10/2009    Years since quitting: 12.8   Smokeless tobacco: Never  Vaping Use   Vaping Use: Never used  Substance and Sexual Activity   Alcohol use: Not Currently    Comment: 2-3 times per week   Drug use: No   Sexual activity: Not on file  Other Topics Concern   Not on file  Social History Narrative   Not on file   Social Determinants of Health   Financial Resource Strain: Not on file  Food Insecurity: No Food Insecurity (09/20/2021)   Hunger Vital Sign    Worried About Running Out of Food in the Last Year: Never true    Ran Out of Food in the Last Year: Never  true  Transportation Needs: No Transportation Needs (09/20/2021)   PRAPARE - Hydrologist (Medical): No    Lack of Transportation (Non-Medical): No  Physical Activity: Not on file  Stress: Not on file  Social Connections: Not on file  Intimate Partner Violence: Not on file   Family History  Problem Relation Age of Onset   Dementia Brother 13       frontal lobe    Dementia Mother    Arthritis Mother    Hypertension Mother    CVA Sister 8   Colon cancer Neg Hx    Pancreatic cancer Neg Hx    Stomach cancer Neg Hx    Rectal cancer Neg Hx    Liver cancer Neg Hx    BP (!) 150/80   Pulse 85   Wt (!) 140.6 kg (310 lb)   SpO2 98%   BMI 43.24 kg/m   Wt Readings from Last 3 Encounters:  11/17/21 (!) 140.6 kg (310 lb)  09/27/21 (!) 136.6 kg (301 lb 3.2 oz)  09/20/21 135 kg (297 lb 9.6 oz)   PHYSICAL EXAM: General:  NAD. No resp difficulty, walked into clinic with rolling walker HEENT: Normal Neck: Supple. JVP to jaw, thick neck. Carotids 2+ bilat; no bruits. No lymphadenopathy or thryomegaly appreciated. Cor: PMI nondisplaced. Regular rate & rhythm. No rubs, gallops or murmurs. Lungs: Clear Abdomen: Obese, soft, nontender, nondistended. No hepatosplenomegaly. No bruits or masses. Good bowel sounds. Extremities: No cyanosis, clubbing, rash, 1-2+ BLE edema to knees; ortho shoes on Neuro: Alert & oriented x 3, cranial nerves grossly intact. Moves all 4 extremities w/o difficulty. Affect pleasant.  MDT device interrogation (personally reviewed): OptiVol trending up, thoracic impedence below reference line, no AF or VT, 1.3 hr/day activity, 94% v-pacing.  ASSESSMENT & PLAN: Chronic Systolic Biventricular Heart Failure - Echo (2/19): EF 25-30% w/  diffuse HK. RV normal. Felt to be viral CM +/- component of tachymediated in setting of influenza PNA and rapid Afib  - Echo (7/19): EF recovered, 50-55%, RV mildly reduced - Echo (11/20): EF 45-50%, RV mildly  reduced (in rate controlled afib)  - Echo (8/23): EF 30-35%, severe LVH, moderately reduced RV systolic function, moderate TR - R/LHC (8/23): diffuse non-obstructive CAD with 90% lesion in dLAD, BiV failure R>L: RA 21 PA 56/22 (36) PCW 24 Fick 7.3/2.7 PaPi 2.1 - Myeloma panel and urine immunofixation negative.  - PYP scan 10/23 equivocal. Resent Genetic testing. - Improved NYHA II, confounded by body habitus and LE wounds. Volume up today, weight up 10 lbs - Increase Entresto to 97/103 mg bid. - Take torsemide 20 mg daily x 3 days, then PRN. - Continue spiro 25 mg daily. - Continue Farxiga 10 mg daily.  - Now w/ PPM, may consider ? blocker/digoxin further down the road if needed.   - Labs today. BMET in 2 weeks.   2. Profound Bradycardia w/ High-grade AV block - Off Coreg - Has known RBBB.  - LHC w/o high grade CAD.  - Pacer dependent, s/p BiV ICD.    3. Chronic Afib - Dates back to at least 2019, has been historically well rate controlled - Has never had attempt at [place]. Given chronicity, tx w/ rhythm control at this point may be difficult  - Size may prohibit ablation.  - Continue CPAP.  - Continue Eliquis. No bleeding issues.   4. H/o AKI due to ATN - Baseline Scr 0.9 - Labs today.    5. PAD/ Chronic Venous Stasis Wounds  - s/p left transmetatarsal amputation - Followed by [facility] and Wound clinic  6. H/o Lymphoma - s/p R-CHOP in 2011 (potential cardiotoxic effects)    7. Type 2DM - Hgb A1c 6.0 - On SGLT2i    8. CAD - LHC (8/23) diffuse mostly non-obstructive CAD, not cause of CM - Continue medical management with ASA/statin. - No current chest pain.  - If has angina can consider PCI PDA   9. Morbid obesity - Body mass index is 43.24 kg/m. - Needs weight loss. - He was on [facility], has not re-started post hospitalization.   10. Hypertension - Improving. - Increase Entresto as above - Add beta blocker next.   11. ILD - Hi rest chest CT nonspecific, but ? NSIP  vs chronic hypersensitivity pneumonitis - Undergoing workup with pulmonary; CTD serologies negative except elevated IgE. PFTs pending.   Follow up next months with Dr. Haroldine Laws + echo as scheduled.  Allena Katz, FNP-BC 11/17/21

## 2021-11-17 NOTE — Progress Notes (Signed)
ARYN, KOPS ([id number]) [id number] Page 1 of 4 Visit Report for 11/17/2021 Arrival Information Details Patient Name: Date of Service: [name], [name] 11/17/2021 2:45 PM Medical Record Number: [id number] Patient Account Number: [id number] Date of Birth/Sex: Treating RN: September 15, 1955 (66 y.o. M) Primary Care Lacorey Brusca: [name] Other Clinician: Referring Berlin Mokry: Treating Nakshatra Klose/Extender: Estelle Grumbles in Treatment: 15 Visit Information History Since Last Visit Added or deleted any medications: No Patient Arrived: [name] Any new allergies or adverse reactions: No Arrival Time: 14:39 Had a fall or experienced change in No Accompanied By: self activities of daily living that may affect Transfer Assistance: None risk of falls: Patient Requires Transmission-Based Precautions: No Signs or symptoms of abuse/neglect since last visito No Patient Has Alerts: Yes Hospitalized since last visit: No Patient Alerts: Patient on Blood Thinner Implantable device outside of the clinic excluding No 01/2021 ABI L 1.13 R 1.06 cellular tissue based products placed in the center 01/2021 TBI L amp R 0.8 since last visit: Has Compression in Place as Prescribed: Yes Pain Present Now: No Electronic Signature(s) Signed: 11/17/2021 3:44:50 PM By: Erenest Blank Entered By: Erenest Blank on 11/17/2021 14:40:32 -------------------------------------------------------------------------------- Compression Therapy Details Patient Name: Date of Service: [name] Gloss [name]. 11/17/2021 2:45 PM Medical Record Number: [id number] Patient Account Number: [id number] Date of Birth/Sex: Treating RN: 1955/02/15 (66 y.o. M) Primary Care Emri Sample: [name] Other Clinician: Referring Kojo Liby: Treating Wyolene Weimann/Extender: Estelle Grumbles in Treatment: 15 Compression Therapy Performed for Wound Assessment: Wound #7 Right,Medial Malleolus Performed By:  Clinician Erenest Blank, Compression Type: Three Layer Electronic Signature(s) Signed: 11/17/2021 3:44:50 PM By: Erenest Blank Entered By: Erenest Blank on 11/17/2021 15:40:26 -------------------------------------------------------------------------------- Encounter Discharge Information Details Patient Name: Date of Service: [name], [name] 11/17/2021 2:45 PM Medical Record Number: [id number] Patient Account Number: [id number] Date of Birth/Sex: Treating RN: Jan 21, 1955 (66 y.o. [name], [name] ([id number]) [id number] Page 2 of 4 Primary Care Lavetta Geier: [name] Other Clinician: Referring Cadence Haslam: Treating Ulah Olmo/Extender: Estelle Grumbles in Treatment: 15 Encounter Discharge Information Items Discharge Condition: Stable Ambulatory Status: Walker Discharge Destination: Home Transportation: Private Auto Accompanied By: friend Schedule Follow-up Appointment: Yes Clinical Summary of Care: Electronic Signature(s) Signed: 11/17/2021 3:44:50 PM By: Erenest Blank Entered By: Erenest Blank on 11/17/2021 15:41:24 -------------------------------------------------------------------------------- Patient/Caregiver Education Details Patient Name: Date of Service: [name] Gloss [name]. 11/8/2023andnbsp2:45 PM Medical Record Number: [id number] Patient Account Number: [id number] Date of Birth/Gender: Treating RN: [birth year] (66 y.o. M) Primary Care Physician: [name] Other Clinician: Erenest Blank Referring Physician: Treating Physician/Extender: Estelle Grumbles in Treatment: 15 Education Assessment Education Provided To: Patient Education Topics Provided Electronic Signature(s) Signed: 11/17/2021 3:44:50 PM By: Erenest Blank Entered By: Erenest Blank on 11/17/2021 15:41:00 -------------------------------------------------------------------------------- Wound Assessment Details Patient Name: Date of  Service: [name], [name] Huger [name]. 11/17/2021 2:45 PM Medical Record Number: [id number] Patient Account Number: [id number] Date of Birth/Sex: Treating RN: 1955-03-27 (66 y.o. M) Primary Care Celicia Minahan: [name] Other Clinician: Referring Devansh Riese: Treating Daymein Nunnery/Extender: Estelle Grumbles in Treatment: 15 Wound Status Wound Number: 7 Primary Etiology: Venous Leg Ulcer Wound Location: Right, Medial Malleolus Secondary Etiology: Lymphedema Wounding Event: Blister Wound Status: Open Date Acquired: 07/21/2021 Weeks Of Treatment: 15 Clustered Wound: No Wound Measurements [name], [name] ([id number]) Length: (cm) 4.4 Width: (cm) 3 Depth: (cm) 0.2 Area: (cm) 10.367 Volume: (cm) 2.073 [id number] Page 3 of 4 % Reduction in Area: 72.9% % Reduction in Volume: 92.3% Wound Description Classification: Full Thickness With  Exposed Support Exudate Amount: Medium Exudate Type: Serosanguineous Exudate Color: red, brown Structures Periwound Skin Texture Texture Color No Abnormalities Noted: No No Abnormalities Noted: No Moisture No Abnormalities Noted: No Treatment Notes Wound #7 (Malleolus) Wound Laterality: Right, Medial Cleanser Peri-Wound Care Triamcinolone 15 (g) Discharge Instruction: Use triamcinolone 15 (g) as directed Sween Lotion (Moisturizing lotion) Discharge Instruction: Apply moisturizing lotion as directed Topical [place] topical compounding antibiotics Discharge Instruction: ****APPLY LIGHTLY**** directly to wound bed. Primary Dressing Promogran Prisma Matrix, 4.34 (sq in) (silver collagen) Discharge Instruction: Moisten collagen with saline or hydrogel Secondary Dressing ABD Pad, 8x10 Discharge Instruction: Apply over primary dressing as directed. Secured With Compression Wrap ThreePress (3 layer compression wrap) Discharge Instruction: Apply three layer use kerlix instead of cotton compression as directed. Compression  Stockings Add-Ons Electronic Signature(s) Signed: 11/17/2021 3:44:50 PM By: Erenest Blank Entered By: Erenest Blank on 11/17/2021 15:37:59 -------------------------------------------------------------------------------- Vitals Details Patient Name: Date of Service: [name], [name] 11/17/2021 2:45 PM Medical Record Number: [id number] Patient Account Number: [id number] Date of Birth/Sex: Treating RN: January 23, 1955 (66 y.o. M) Primary Care Hannia Matchett: [name] Other Clinician: Referring Nuriyah Hanline: Treating Jenica Costilow/Extender: Estelle Grumbles in Treatment: [address] [name], [name] ([id number]) [id number] Page 4 of 4 Time Taken: 14:45 Temperature (F): 97.7 Height (in): 71 Respiratory Rate (breaths/min): 18 Weight (lbs): 350 Blood Pressure (mmHg): 149/87 Body Mass Index (BMI): 48.8 Reference Range: 80 - 120 mg / dl Electronic Signature(s) Signed: 11/17/2021 3:44:50 PM By: Erenest Blank Entered By: Erenest Blank on 11/17/2021 15:40:02

## 2021-11-19 NOTE — Telephone Encounter (Signed)
Advanced Heart Failure Patient Advocate Encounter  POI and OOP faxed to [employer] on 11/19/2021

## 2021-11-19 NOTE — Telephone Encounter (Signed)
Advanced Heart Failure Patient Advocate Encounter  Faxed POI to [employer] on 11/19/2021

## 2021-11-22 ENCOUNTER — Ambulatory Visit (INDEPENDENT_AMBULATORY_CARE_PROVIDER_SITE_OTHER): Payer: Medicare Other | Admitting: Pulmonary Disease

## 2021-11-22 ENCOUNTER — Encounter: Payer: Self-pay | Admitting: Pulmonary Disease

## 2021-11-22 VITALS — BP 140/70 | HR 93 | Ht 70.0 in | Wt 309.0 lb

## 2021-11-22 DIAGNOSIS — J849 Interstitial pulmonary disease, unspecified: Secondary | ICD-10-CM

## 2021-11-22 DIAGNOSIS — E785 Hyperlipidemia, unspecified: Secondary | ICD-10-CM | POA: Diagnosis not present

## 2021-11-22 DIAGNOSIS — Z23 Encounter for immunization: Secondary | ICD-10-CM | POA: Diagnosis not present

## 2021-11-22 DIAGNOSIS — E1151 Type 2 diabetes mellitus with diabetic peripheral angiopathy without gangrene: Secondary | ICD-10-CM | POA: Diagnosis not present

## 2021-11-22 DIAGNOSIS — R82998 Other abnormal findings in urine: Secondary | ICD-10-CM | POA: Diagnosis not present

## 2021-11-22 DIAGNOSIS — G4733 Obstructive sleep apnea (adult) (pediatric): Secondary | ICD-10-CM | POA: Diagnosis not present

## 2021-11-22 DIAGNOSIS — I2510 Atherosclerotic heart disease of native coronary artery without angina pectoris: Secondary | ICD-10-CM | POA: Diagnosis not present

## 2021-11-22 DIAGNOSIS — Z0000 Encounter for general adult medical examination without abnormal findings: Secondary | ICD-10-CM | POA: Diagnosis not present

## 2021-11-22 LAB — PULMONARY FUNCTION TEST
DL/VA % pred: 89 %
DL/VA: 3.67 ml/min/mmHg/L
DLCO cor % pred: 60 %
DLCO cor: 16.04 ml/min/mmHg
DLCO unc % pred: 60 %
DLCO unc: 16.04 ml/min/mmHg
FEF 25-75 Post: 2.05 L/sec
FEF 25-75 Pre: 3.05 L/sec
FEF2575-%Change-Post: -32 %
FEF2575-%Pred-Post: 76 %
FEF2575-%Pred-Pre: 114 %
FEV1-%Change-Post: -10 %
FEV1-%Pred-Post: 67 %
FEV1-%Pred-Pre: 75 %
FEV1-Post: 2.29 L
FEV1-Pre: 2.55 L
FEV1FVC-%Change-Post: -14 %
FEV1FVC-%Pred-Pre: 113 %
FEV6-%Change-Post: 4 %
FEV6-%Pred-Post: 72 %
FEV6-%Pred-Pre: 69 %
FEV6-Post: 3.14 L
FEV6-Pre: 3 L
FEV6FVC-%Change-Post: 0 %
FEV6FVC-%Pred-Post: 105 %
FEV6FVC-%Pred-Pre: 105 %
FVC-%Change-Post: 4 %
FVC-%Pred-Post: 68 %
FVC-%Pred-Pre: 65 %
FVC-Post: 3.14 L
FVC-Pre: 3.01 L
Post FEV1/FVC ratio: 73 %
Post FEV6/FVC ratio: 100 %
Pre FEV1/FVC ratio: 85 %
Pre FEV6/FVC Ratio: 100 %
RV % pred: 68 %
RV: 1.63 L
TLC % pred: 68 %
TLC: 4.81 L

## 2021-11-22 MED ORDER — PREDNISONE 10 MG PO TABS: ORAL_TABLET | ORAL | 0 refills | Status: AC

## 2021-11-22 NOTE — Patient Instructions (Signed)
We will start you on prednisone starting at 30 mg a day.  Continue this for 2 weeks and then start tapering by 10 mg every week until fully tapered off Please get rid of the down pillows I will see you back in clinic in 3 to 4 months.

## 2021-11-22 NOTE — Telephone Encounter (Signed)
Advanced Heart Failure Patient Advocate Encounter   Patient was approved to receive Eliquis from [employer]  Effective dates: 11/20/21 through 01/09/22  Document scanned to chart.

## 2021-11-22 NOTE — Progress Notes (Signed)
Full PFT performed today. °

## 2021-11-22 NOTE — Patient Instructions (Signed)
Full PFT performed today. °

## 2021-11-22 NOTE — Progress Notes (Signed)
[name]    [id number]    1955-09-11  Primary Care Physician:[name], Emily Filbert., MD  Referring Physician: Ginger Organ., MD [address] [place],  [place] [address]  Chief complaint: Follow up for interstitial lung disease  HPI: 66 y.o. who  has a past medical history of Acute systolic HF (heart failure) ([place]), Arthritis, Atrial fibrillation ([place]), Diabetes mellitus, new onset ([place]), Diverticulosis, Dyslipidemia, Gout attack (01/16/2012), Hypertension, Internal hemorrhoids, Lymphoma ([place]), Pneumonia due to COVID-19 virus (07/2018), Pulmonary hypertension ([place]), Sepsis ([place]) (02/2017), Tubular adenoma of colon, and Venous stasis ulcers ([place]).   History notable for hospitalization in February 2019 with ARDS following influenza.  Patient was not tested for COVID and believes that he may have had COVID infection during early months of the pandemic.  He also had newly diagnosed systolic CHF with EF 63%.  Had a tracheostomy which was eventually decannulated on March 22, 2017.  Other history of OSA, on CPAP.  Needs a new mask as he is lost some weight  Follows with cardiology for chronic systolic biventricular heart failure with a EF 25-30%, bradycardia, BiV ICD, chronic A-fib.  Diagnosed with lymphoma s/p R-[facility] in 2011.  Referred for evaluation of interstitial lung disease noted on screening CT of the chest.  Has chronic dyspnea on exertion which is slightly better now after he got the pacer/ICD and weight loss.  Denies any cough, congestion, sputum production.  Pets: Dogs, cats in the past Occupation: Works as a Customer service manager Exposures: Exposure to some hair spray at work.  No mold, hot tub, Jacuzzi.  He has down pillows that he has been using for many years Smoking history: 10-pack-year smoker.  Quit in 2010 Travel history: No significant travel history Relevant family history: No family history of lung disease  Interim history: He is here for review  of PFTs, labs and CT scan States that breathing is stable  Outpatient Encounter Medications as of 11/22/2021  Medication Sig   acetaminophen (TYLENOL) 325 MG tablet Take 650 mg by mouth every 6 (six) hours as needed for moderate pain.    ascorbic acid (VITAMIN C) 500 MG tablet Take 1 tablet (500 mg total) by mouth 3 (three) times daily. (Patient taking differently: Take 500 mg by mouth 2 (two) times daily.)   aspirin 81 MG chewable tablet Chew 1 tablet (81 mg total) by mouth daily.   atorvastatin (LIPITOR) 40 MG tablet TAKE 1 TABLET BY MOUTH EVERY DAY   cholecalciferol (VITAMIN D) 25 MCG (1000 UNIT) tablet Take 1 tablet (1,000 Units total) by mouth daily.   colchicine 0.6 MG tablet Take 0.6 mg by mouth 2 (two) times daily as needed.   Continuous Blood Gluc Receiver (FREESTYLE LIBRE 2 READER) DEVI 1 each by Does not apply route daily.   Continuous Blood Gluc Sensor (FREESTYLE LIBRE 2 SENSOR) MISC 1 each by Does not apply route daily.   dapagliflozin propanediol (FARXIGA) 10 MG TABS tablet Take 1 tablet (10 mg total) by mouth daily.   ELIQUIS 5 MG TABS tablet TAKE 1 TABLET BY MOUTH TWICE A DAY   Omega 3 340 MG CPDR Take 1 capsule (340 mg total) by mouth 2 (two) times daily. (Patient taking differently: Take 340 mg by mouth daily.)   omeprazole (PRILOSEC) 40 MG capsule Take 1 capsule (40 mg total) by mouth daily.   polycarbophil (FIBERCON) 625 MG tablet Take 1 tablet (625 mg total) by mouth daily.  sacubitril-valsartan (ENTRESTO) 97-103 MG Take 1 tablet by mouth 2 (two) times daily.   SANTYL 250 UNIT/GM ointment APPLY 1 APPLICATION. TOPICALLY DAILY.   sertraline (ZOLOFT) 50 MG tablet Take 1 tablet (50 mg total) by mouth daily.   spironolactone (ALDACTONE) 25 MG tablet Take 1 tablet (25 mg total) by mouth daily.   Tirzepatide (MOUNJARO [place]) Inject 1 Dose into the skin once a week. (Patient not taking: Reported on 11/17/2021)   torsemide (DEMADEX) 20 MG tablet Take 1 tablet (20 mg total) by mouth as  needed.   vitamin B-12 (CYANOCOBALAMIN) 1000 MCG tablet Take 1 tablet (1,000 mcg total) by mouth daily.   [DISCONTINUED] carvedilol (COREG) 6.25 MG tablet Take 6.25 mg by mouth 2 (two) times daily.   [DISCONTINUED] sodium chloride (OCEAN) 0.65 % SOLN nasal spray Place 2 sprays into both nostrils as needed for congestion.   No facility-administered encounter medications on file as of 11/22/2021.   Physical Exam: Blood pressure (!) 140/70, pulse 93, height _0  (1.778 m), weight (!) 309 lb (140.2 kg), SpO2 98 %. Gen:      No acute distress HEENT:  EOMI, sclera anicteric Neck:     No masses; no thyromegaly Lungs:    Clear to auscultation bilaterally; normal respiratory effort CV:         Regular rate and rhythm; no murmurs Abd:      + bowel sounds; soft, non-tender; no palpable masses, no distension Ext:    No edema; adequate peripheral perfusion Skin:      Warm and dry; no rash Neuro: alert and oriented x 3 Psych: normal mood and affect   Data Reviewed: Imaging: Screening CT chest 03/01/2021-patchy thickening of peribronchovascular interstitium with groundglass attenuation, septal thickening in the mid to upper lungs.  This is new compared to prior.    High resolution CT 11/11/2021- Groundglass attenuation, septal thickening with mild air trapping.  Alternate pattern I have reviewed the images personally.  PFTs: 06/19/2017 FVC 3.90 [82%], FEV1 3.35 [94%], F/F 86, TLC 5.13 [73%], DLCO 16.92 [52%] Minimal restriction, moderate diffusion defect  11/22/2021 FVC 3.14 [68%], FEV1 2.29 [61%], TLC 4.81 [68%], DLCO 16.04 [60%]   Labs: CTD serologies 09/27/2021-negative IgE 09/27/2021-2791  Assessment:  Follow-up for interstitial lung disease His CT scan which is a low-dose screening CT does show some vague changes of upper lobe predominant groundglass opacification and interstitial thickening.  This is not very evident from prior CT scans.  He does have a hospitalization in 2019 for  severe ARDS secondary to influenza, suspected COVID-19 and also ongoing exposure to down pillows for many years but this change appears to be new  PFTs show worsening with action and diffusion impairment.  We had a detailed discussion about this in office today.  He does not want to get lung biopsy or bronchoscopy for BAL.  I have advised him to get rid of his feather pillows and will treat him empirically with short course of steroids.  Start prednisone at 30 mg for 2 weeks and then taper by 10 mg every week  If there is progression in spite of allergen avoidance and steroids then will reconsider biopsy.  OSA On CPAP per his primary care.  He needs new mask fitting and is going to contact his DME company for this.  Plan/Recommendations: Get rid of down pillows Prednisone course  Marshell Garfinkel MD [facility] 11/22/2021, 1:19 PM  CC: Ginger Organ., MD

## 2021-11-23 ENCOUNTER — Other Ambulatory Visit (HOSPITAL_COMMUNITY): Payer: Self-pay

## 2021-11-23 NOTE — Telephone Encounter (Signed)
Advanced Heart Failure Patient Surveyor, minerals for update.  Representative stated application was inappropriately withdrawn 'by the computer system', and POI faxed on 11/10 was not received.  Refaxed POI, [employer] is reopening the application and will provide an update within the next 2-3 business days.

## 2021-11-23 NOTE — Telephone Encounter (Signed)
Advanced Heart Failure Patient Advocate Encounter  Left voicemail with approval information and BMS contact number for delivery.

## 2021-11-24 ENCOUNTER — Encounter (HOSPITAL_BASED_OUTPATIENT_CLINIC_OR_DEPARTMENT_OTHER): Payer: Medicare Other | Admitting: Physician Assistant

## 2021-11-24 DIAGNOSIS — L97828 Non-pressure chronic ulcer of other part of left lower leg with other specified severity: Secondary | ICD-10-CM | POA: Diagnosis not present

## 2021-11-24 DIAGNOSIS — I890 Lymphedema, not elsewhere classified: Secondary | ICD-10-CM | POA: Diagnosis not present

## 2021-11-24 DIAGNOSIS — L97312 Non-pressure chronic ulcer of right ankle with fat layer exposed: Secondary | ICD-10-CM | POA: Diagnosis not present

## 2021-11-24 DIAGNOSIS — I87333 Chronic venous hypertension (idiopathic) with ulcer and inflammation of bilateral lower extremity: Secondary | ICD-10-CM | POA: Diagnosis not present

## 2021-11-24 DIAGNOSIS — I872 Venous insufficiency (chronic) (peripheral): Secondary | ICD-10-CM | POA: Diagnosis not present

## 2021-11-24 DIAGNOSIS — E11621 Type 2 diabetes mellitus with foot ulcer: Secondary | ICD-10-CM | POA: Diagnosis not present

## 2021-11-24 DIAGNOSIS — L97818 Non-pressure chronic ulcer of other part of right lower leg with other specified severity: Secondary | ICD-10-CM | POA: Diagnosis not present

## 2021-11-24 DIAGNOSIS — L97518 Non-pressure chronic ulcer of other part of right foot with other specified severity: Secondary | ICD-10-CM | POA: Diagnosis not present

## 2021-11-24 NOTE — Progress Notes (Signed)
[name], [name] ([id number]) [id number] Page 1 of 9 Visit Report for 11/24/2021 Chief Complaint Document Details Patient Name: Date of Service: [name], [name] 11/24/2021 2:30 PM Medical Record Number: [id number] Patient Account Number: [id number] Date of Birth/Sex: Treating RN: 20-Nov-1955 (66 y.o. M) Primary Care Provider: [name] Other Clinician: Referring Provider: Treating Provider/Extender: [name] in Treatment: 16 Information Obtained from: Patient Chief Complaint 08/04/2021; patient returns to clinic with bilateral leg wounds as well as areas on the right foot Electronic Signature(s) Signed: 11/24/2021 2:49:16 PM By: [name] PA-C Entered By: [name] on 11/24/2021 14:49:16 -------------------------------------------------------------------------------- Debridement Details Patient Name: Date of Service: Tamplin, [name] 11/24/2021 2:30 PM Medical Record Number: [id number] Patient Account Number: [id number] Date of Birth/Sex: Treating RN: 1955-03-22 (66 y.o. [name], [name] Primary Care Provider: [name] Other Clinician: Referring Provider: Treating Provider/Extender: [name] in Treatment: 16 Debridement Performed for Assessment: Wound #7 Right,Medial Malleolus Performed By: Physician [name], PA Debridement Type: Debridement Severity of Tissue Pre Debridement: Fat layer exposed Level of Consciousness (Pre-procedure): Awake and Alert Pre-procedure Verification/Time Out Yes - 15:05 Taken: Start Time: 15:06 Pain Control: Lidocaine 5% topical ointment T Area Debrided (L x W): otal 4.2 (cm) x 2.9 (cm) = 12.18 (cm) Tissue and other material debrided: Viable, Non-Viable, Slough, Subcutaneous, Skin: Dermis , Skin: Epidermis, Slough Level: Skin/Subcutaneous Tissue Debridement Description: Excisional Instrument: Curette Bleeding: Minimum Hemostasis Achieved:  Pressure End Time: 15:10 Procedural Pain: 0 Post Procedural Pain: 0 Response to Treatment: Procedure was tolerated well Level of Consciousness (Post- Awake and Alert procedure): Post Debridement Measurements of Total Wound Length: (cm) 4.2 Width: (cm) 2.9 Depth: (cm) 0.2 Volume: (cm) 1.913 Character of Wound/Ulcer Post Debridement: Improved Severity of Tissue Post Debridement: Fat layer exposed [name], [name] [name] ([id number]) [id number] Page 2 of 9 Post Procedure Diagnosis Same as Pre-procedure Electronic Signature(s) Signed: 11/24/2021 3:58:30 PM By: [name] PA-C Entered By: Deon Pilling on 11/24/2021 15:10:20 -------------------------------------------------------------------------------- HPI Details Patient Name: Date of Service: [name], [name] 11/24/2021 2:30 PM Medical Record Number: [id number] Patient Account Number: [id number] Date of Birth/Sex: Treating RN: Jul 15, 1955 (66 y.o. M) Primary Care Provider: [name] Other Clinician: Referring Provider: Treating Provider/Extender: [name] in Treatment: 16 History of Present Illness HPI Description: ADMISSION 03/22/2021 This is a 66 year old man with a past medical history significant for diabetes type 2, congestive heart failure, peripheral arterial disease, morbid obesity, venous insufficiency, and coronary artery disease. He has been followed by [name] in podiatry, who performed a transmetatarsal amputation on the left foot in August 2022. He had issues healing that wound, but based upon Dr. [name] notes, ultimately the TMA wound healed. During his recovery from that surgery, however, ulcers opened up over the DIP joint of the right second and third toe. These have apparently closed and reopened multiple times. It sounds like one of the issues has been moisture accumulation and maceration of the tissues causing them to reopen. At his last visit with Dr.  Earleen Newport, on March 01, 2021, there continues to be problems with moisture and he was referred to wound care for further evaluation and management. He had a formal aortogram with runoff performed prior to his TMA. The findings are copied here: Patient has inline flow to both feet with no significant flow-limiting lesion that would be amenable to percutaneous or open revascularization. He does have an element of small vessel disease and  has a short segment occlusion of the distal anterior tibial/dorsalis pedis artery on the left foot but does have posterior tibial artery flow. Would recommend management of wounds with amputation of toes 2 and 3 on the right foot if the wounds do not heal and deteriorate. Transmetatarsal amputation on the left side has as good a blood supply as it is going to get and hopefully this will heal in the future. Formal ABIs were done in January 2023. They are normal bilaterally. ABI Findings: +---------+------------------+-----+----------+--------+ Right Rt Pressure (mmHg)IndexWaveform Comment  +---------+------------------+-----+----------+--------+ Brachial 160     +---------+------------------+-----+----------+--------+ PTA 192 1.06 monophasic  +---------+------------------+-----+----------+--------+ DP 159 0.88 monophasic  +---------+------------------+-----+----------+--------+ Great T oe145 0.80    +---------+------------------+-----+----------+--------+ +--------+------------------+-----+---------+-------+ Left Lt Pressure (mmHg)IndexWaveform Comment +--------+------------------+-----+---------+-------+ [id number]     +--------+------------------+-----+---------+-------+ PTA 204 1.13 triphasic  +--------+------------------+-----+---------+-------+ DP 194 1.07 biphasic   +--------+------------------+-----+---------+-------+ +-------+-----------+-----------+------------+------------+ ABI/TBIT oday's  ABIT oday's TBIPrevious ABIPrevious TBI +-------+-----------+-----------+------------+------------+ Right 1.06 0.8 1.26 0.65  +-------+-----------+-----------+------------+------------+ Left 1.13 amputation 1.15 amputation  +-------+-----------+-----------+------------+------------+ Previous ABI on 08/06/20 at [facility] Pedal pressures falsely elevated due to medial calcification. SummaryEVERHETT, BOZARD ([id number]) [id number] Page 3 of 9 Right: Resting right ankle-brachial index is within normal range. The right toe-brachial index is normal. Left: Resting left ankle-brachial index is within normal range. READMISSION 08/04/2021 [name] is now a 66 year old man who I remember from this clinic many years ago I think he had a right lower extremity predominantly venous wound at the time. He was here for 1 visit in March of this year had wounds on his right second and third toes we apparently dressed them many and they healed so he did not come back. He is listed in [place] is being a diabetic although the patient denies this says he is verified it with his primary doctor. In any case over the last several weeks or so according the patient although these wounds look somewhat more chronic than that he has developed predominantly large wounds on the right medial and right lateral ankle smaller areas on the left leg and areas on the dorsal aspect of his right second and third toes. Its not clear how he has been dressing these. 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 The patient has known PAD. He had an angiogram in August 2022 at which time he had nonhealing wounds in both feet. On the left his major vessels in the thigh were all patent. He had three-vessel patent to the level of the ankle. He had a very short occlusion in the left anterior tibial. On the right lower  extremity the major vessels in his thigh were all patent. He had three-vessel runoff to the foot sluggish filling of the anterior tibial artery. He was felt to have some component of small vessel disease but nothing that was amenable or needed revascularization. It was recommended that he have amputation of the second and third toes on the right foot if they did not heal He has been following with [name] of podiatry. [name] got him juxta lite stockings although I do not think he had them on properly he has uncontrolled edema in both legs Past medical history includes type 2 diabetes [although the patient really denies this], left TMA in 2022,lower extremity wounds in fact attendance at this clinic in 2009-2010, A-fib on Eliquis, chronic venous insufficiency with secondary lymphedema history of non-Hodgkin's lymphoma. 08-11-2021 upon evaluation today patient presents for  follow-up evaluation he was seen last Wednesday initially for inspection here in our clinic. With that being said he tells me that he unfortunately has been having a lot of drainage and is actually coming through his wrap. Fortunately I do not see any evidence of active infection locally or systemically at this time which is great news. No fevers, chills, nausea, vomiting, or diarrhea. With that being said there does appear to be some evidence of local infection based on what I am seeing today. 08-18-2021 upon evaluation today patient appears to be doing okay currently in regard to his wounds with that being said that he is doing much better but still has a long ways to go to get to where he wanted to be. I think the infection is significantly improved. He has another week of the antibiotic at this point. 08-25-2021 upon evaluation today patient's wounds are actually doing decently well he has erythema has significantly improved. I think the cellulitis is under controlled I am going to continue him on 2 more weeks of the Levaquin at  500 mg this is a lower dose but I am hoping it will be better for him. 09-08-2021 upon evaluation today patient appears to be doing excellent in regard to his wounds. Since I last saw him he was actually in the hospital where he ended up having a pacemaker put in. Subsequently he tells me that he is actually doing quite well although they were unsure whether they were going to do it due to the fact that he had the wounds on his legs. And then I am glad they did anything seem to be doing well. 09-15-2021 upon evaluation patient's wounds are actually showing signs of improvement. The right side wounds do appear to have some need for sharp debridement today and I Georgina Peer go ahead and proceed with that. I think that if we get the wounds cleaned up he will actually show signs of continued improvement. I am also leaning towards switching to [facility] which I think will be a much better option for him. 09-22-2021 patient's wounds are showing signs of excellent improvement. I am actually extremely pleased with where we stand and I think that the patient is making great progress. There does not appear to be any signs of active infection. 09-29-2021 upon evaluation today patient appears to be doing excellent in regard to his wounds. He is actually tolerating the dressing changes without complication. Fortunately I see no evidence of active infection locally or systemically at this time which is great news and overall I am extremely pleased with where we stand currently. 09-2721 upon evaluation today patient actually appears to be doing excellent in regard to his wounds. The left leg is almost completely healed the right ankle is significantly smaller. Overall I am extremely pleased with where we stand at this point. I do not see any evidence of active infection at this time. 10-13-2021 upon evaluation today patient appears to be doing excellent in regard to his wounds. I really feel like he is making good progress  here and I am very pleased in that regard. Fortunately I do not see any signs of active infection at this time. We are using the [facility] topical antibiotic therapy. 10-20-2021 upon evaluation today patient appears to be doing well with regard to his wound on the right medial ankle region the left leg is almost completely healed. I am actually very pleased with where we stand today. 11-03-2021 upon evaluation today patient's wound actually  is going require some sharp debridement but appears to be doing much better which is great news. Fortunately I do not see any signs of active infection at this time. 11-10-2021 upon evaluation today patient's wound is actually showing signs of excellent improvement. Fortunately I do not see any evidence of infection locally or systemically which is great news and overall I am extremely pleased with where we stand today. I do believe he is making good progress he does have his [facility]stone topical antibiotics with him here today. 11-24-2021 upon evaluation today patient actually showing signs of excellent improvement this appears to be doing much better. Fortunately I do not see any evidence of infection locally or systemically at this time. Electronic Signature(s) Signed: 11/24/2021 3:50:13 PM By: [name] PA-C Entered By: [name] on 11/24/2021 15:50:13 -------------------------------------------------------------------------------- Physical Exam Details Patient Name: Date of Service: [name], [name] 11/24/2021 2:30 PM Wilinski, Tarri Fuller ([id number]) [id number] Page 4 of 9 Medical Record Number: [id number] Patient Account Number: [id number] Date of Birth/Sex: Treating RN: 05/21/1955 (66 y.o. M) Primary Care Provider: [name] Other Clinician: Referring Provider: Treating Provider/Extender: [name] in Treatment: 25 Constitutional Well-nourished and well-hydrated in no acute  distress. Respiratory normal breathing without difficulty. Psychiatric this patient is able to make decisions and demonstrates good insight into disease process. Alert and Oriented x 3. pleasant and cooperative. Notes Patient's wound bed showed evidence of good granulation and epithelization at this point. Fortunately I do not see any evidence of worsening overall which is great news and in general I do believe that we are headed in the right direction here. I do believe is well that the patient is going to require some debridement today and I performed that debridement without complication postdebridement the wound bed appears to be doing significantly better which is great news. No fevers, chills, nausea, vomiting, or diarrhea. Electronic Signature(s) Signed: 11/24/2021 3:50:59 PM By: [name] PA-C Entered By: [name] on 11/24/2021 15:50:59 -------------------------------------------------------------------------------- Physician Orders Details Patient Name: Date of Service: [name], [name] 11/24/2021 2:30 PM Medical Record Number: [id number] Patient Account Number: [id number] Date of Birth/Sex: Treating RN: 1955-08-12 (66 y.o. [name], [name] Primary Care Provider: [name] Other Clinician: Referring Provider: Treating Provider/Extender: [name] in Treatment: [phone] Verbal / Phone Orders: No Diagnosis Coding ICD-10 Coding Code Description I89.0 Lymphedema, not elsewhere classified I87.333 Chronic venous hypertension (idiopathic) with ulcer and inflammation of bilateral lower extremity L97.828 Non-pressure chronic ulcer of other part of left lower leg with other specified severity L97.818 Non-pressure chronic ulcer of other part of right lower leg with other specified severity E11.621 Type 2 diabetes mellitus with foot ulcer L97.518 Non-pressure chronic ulcer of other part of right foot with other specified severity Follow-up  Appointments ppointment in 1 week. Margarita Grizzle Wednsday Return A ppointment in 2 weeks. Margarita Grizzle on Wednesday Return A Other: - [place] topical compounding antibiotics- bring to your appointments. bring in compression stockings to weekly appts. Anesthetic (In clinic) Topical Lidocaine 5% applied to wound bed Bathing/ Shower/ Hygiene May shower with protection but do not get wound dressing(s) wet. Edema Control - Lymphedema / SCD / Other Elevate legs to the level of the heart or above for 30 minutes daily and/or when sitting, a frequency of: - 3-4 times a day throughout the day. Avoid standing for long periods of time. Exercise regularly Moisturize legs daily. - apply every night before bed to  left leg. Compression stocking or Garment 30-40 mm/Hg pressure to: - wear your VIVE compression stocking to left leg. apply in the morning and remove at night. Will apply tubigrip size [name] to left leg. Remove once you get home and use your compression stockings. Wound Treatment COPE, MARTE ([id number]) [id number] Page 5 of 9 Wound #7 - Malleolus Wound Laterality: Right, Medial Peri-Wound Care: Triamcinolone 15 (g) 1 x Per Week/30 Days Discharge Instructions: Use triamcinolone 15 (g) as directed Peri-Wound Care: Sween Lotion (Moisturizing lotion) 1 x Per Week/30 Days Discharge Instructions: Apply moisturizing lotion as directed Topical: Keystone topical compounding antibiotics 1 x Per Week/30 Days Discharge Instructions: ****APPLY LIGHTLY**** to medial portion of wound. Prim Dressing: Promogran Prisma Matrix, 4.34 (sq in) (silver collagen) 1 x Per Week/30 Days ary Discharge Instructions: Moisten collagen with saline or hydrogel Secondary Dressing: ABD Pad, 8x10 1 x Per Week/30 Days Discharge Instructions: Apply over primary dressing as directed. Compression Wrap: ThreePress (3 layer compression wrap) 1 x Per Week/30 Days Discharge Instructions: Apply three layer use  kerlix instead of cotton compression as directed. Electronic Signature(s) Signed: 11/24/2021 3:58:30 PM By: [name] PA-C Entered By: Deon Pilling on 11/24/2021 15:11:23 -------------------------------------------------------------------------------- Problem List Details Patient Name: Date of Service: [name], [name] 11/24/2021 2:30 PM Medical Record Number: [id number] Patient Account Number: [id number] Date of Birth/Sex: Treating RN: 06-21-1955 (66 y.o. M) Primary Care Provider: [name] Other Clinician: Referring Provider: Treating Provider/Extender: [name] in Treatment: 16 Active Problems ICD-10 Encounter Code Description Active Date MDM Diagnosis I89.0 Lymphedema, not elsewhere classified 08/04/2021 No Yes I87.333 Chronic venous hypertension (idiopathic) with ulcer and inflammation of 08/04/2021 No Yes bilateral lower extremity L97.828 Non-pressure chronic ulcer of other part of left lower leg with other specified 08/04/2021 No Yes severity L97.818 Non-pressure chronic ulcer of other part of right lower leg with other specified 08/04/2021 No Yes severity E11.621 Type 2 diabetes mellitus with foot ulcer 08/04/2021 No Yes L97.518 Non-pressure chronic ulcer of other part of right foot with other specified 08/04/2021 No Yes severity [name], [name] ([id number]) [id number] Page 6 of 9 Inactive Problems Resolved Problems Electronic Signature(s) Signed: 11/24/2021 2:49:09 PM By: [name] PA-C Entered By: [name] on 11/24/2021 14:49:08 -------------------------------------------------------------------------------- Progress Note Details Patient Name: Date of Service: [name], [name] 11/24/2021 2:30 PM Medical Record Number: [id number] Patient Account Number: [id number] Date of Birth/Sex: Treating RN: August 12, 1955 (66 y.o. M) Primary Care Provider: [name] Other Clinician: Referring  Provider: Treating Provider/Extender: [name] in Treatment: 16 Subjective Chief Complaint Information obtained from Patient 08/04/2021; patient returns to clinic with bilateral leg wounds as well as areas on the right foot History of Present Illness (HPI) ADMISSION 03/22/2021 This is a 66 year old man with a past medical history significant for diabetes type 2, congestive heart failure, peripheral arterial disease, morbid obesity, venous insufficiency, and coronary artery disease. He has been followed by [name] in podiatry, who performed a transmetatarsal amputation on the left foot in August 2022. He had issues healing that wound, but based upon Dr. [name] notes, ultimately the TMA wound healed. During his recovery from that surgery, however, ulcers opened up over the DIP joint of the right second and third toe. These have apparently closed and reopened multiple times. It sounds like one of the issues has been moisture accumulation and maceration of the tissues causing them to reopen. At his last visit with [name], on March 01, 2021,  there continues to be problems with moisture and he was referred to wound care for further evaluation and management. He had a formal aortogram with runoff performed prior to his TMA. The findings are copied here: Patient has inline flow to both feet with no significant flow-limiting lesion that would be amenable to percutaneous or open revascularization. He does have an element of small vessel disease and has a short segment occlusion of the distal anterior tibial/dorsalis pedis artery on the left foot but does have posterior tibial artery flow. Would recommend management of wounds with amputation of toes 2 and 3 on the right foot if the wounds do not heal and deteriorate. Transmetatarsal amputation on the left side has as good a blood supply as it is going to get and hopefully this will heal in the future. Formal ABIs were  done in January 2023. They are normal bilaterally. ABI Findings: +---------+------------------+-----+----------+--------+ Right Rt Pressure (mmHg)IndexWaveform Comment  +---------+------------------+-----+----------+--------+ Brachial 160    +---------+------------------+-----+----------+--------+ PTA 192 1.06 monophasic  +---------+------------------+-----+----------+--------+ DP 159 0.88 monophasic  +---------+------------------+-----+----------+--------+ Great T oe145 0.80    +---------+------------------+-----+----------+--------+ +--------+------------------+-----+---------+-------+ Left Lt Pressure (mmHg)IndexWaveform Comment +--------+------------------+-----+---------+-------+ [id number]    +--------+------------------+-----+---------+-------+ PTA 204 1.13 triphasic  +--------+------------------+-----+---------+-------+ DP 194 1.07 biphasic   +--------+------------------+-----+---------+-------+ +-------+-----------+-----------+------------+------------+ ABI/TBIT oday's ABIT oday's TBIPrevious ABIPrevious TBI +-------+-----------+-----------+------------+------------+ Right 1.06 0.8 1.26 0.65  +-------+-----------+-----------+------------+------------+ Left 1.13 amputation 1.15 amputation  +-------+-----------+-----------+------------+------------+ [name], ANGLIN [name] ([id number]) [id number] Page 7 of 9 Previous ABI on 08/06/20 at [facility] pressures falsely elevated due to medial calcification. Summary: Right: Resting right ankle-brachial index is within normal range. The right toe-brachial index is normal. Left: Resting left ankle-brachial index is within normal range. READMISSION 08/04/2021 Mr. Mcmanamon is now a 66 year old man who I remember from this clinic many years ago I think he had a right lower extremity predominantly venous wound at the time. He was here for 1  visit in March of this year had wounds on his right second and third toes we apparently dressed them many and they healed so he did not come back. He is listed in [place] is being a diabetic although the patient denies this says he is verified it with his primary doctor. In any case over the last several weeks or so according the patient although these wounds look somewhat more chronic than that he has developed predominantly large wounds on the right medial and right lateral ankle smaller areas on the left leg and areas on the dorsal aspect of his right second and third toes. Its not clear how he has been dressing these. 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 The patient has known PAD. He had an angiogram in August 2022 at which time he had nonhealing wounds in both feet. On the left his major vessels in the thigh were all patent. He had three-vessel patent to the level of the ankle. He had a very short occlusion in the left anterior tibial. On the right lower extremity the major vessels in his thigh were all patent. He had three-vessel runoff to the foot sluggish filling of the anterior tibial artery. He was felt to have some component of small vessel disease but nothing that was amenable or needed revascularization. It was recommended that he have amputation of the second and third toes on the right foot if they did not heal He has been following with [name] of podiatry. [name] got him juxta lite  stockings although I do not think he had them on properly he has uncontrolled edema in both legs Past medical history includes type 2 diabetes [although the patient really denies this], left TMA in 2022,lower extremity wounds in fact attendance at this clinic in 2009-2010, A-fib on Eliquis, chronic venous insufficiency with secondary lymphedema history of non-Hodgkin's lymphoma. 08-11-2021 upon evaluation today  patient presents for follow-up evaluation he was seen last Wednesday initially for inspection here in our clinic. With that being said he tells me that he unfortunately has been having a lot of drainage and is actually coming through his wrap. Fortunately I do not see any evidence of active infection locally or systemically at this time which is great news. No fevers, chills, nausea, vomiting, or diarrhea. With that being said there does appear to be some evidence of local infection based on what I am seeing today. 08-18-2021 upon evaluation today patient appears to be doing okay currently in regard to his wounds with that being said that he is doing much better but still has a long ways to go to get to where he wanted to be. I think the infection is significantly improved. He has another week of the antibiotic at this point. 08-25-2021 upon evaluation today patient's wounds are actually doing decently well he has erythema has significantly improved. I think the cellulitis is under controlled I am going to continue him on 2 more weeks of the Levaquin at 500 mg this is a lower dose but I am hoping it will be better for him. 09-08-2021 upon evaluation today patient appears to be doing excellent in regard to his wounds. Since I last saw him he was actually in the hospital where he ended up having a pacemaker put in. Subsequently he tells me that he is actually doing quite well although they were unsure whether they were going to do it due to the fact that he had the wounds on his legs. And then I am glad they did anything seem to be doing well. 09-15-2021 upon evaluation patient's wounds are actually showing signs of improvement. The right side wounds do appear to have some need for sharp debridement today and I Georgina Peer go ahead and proceed with that. I think that if we get the wounds cleaned up he will actually show signs of continued improvement. I am also leaning towards switching to [facility] which I  think will be a much better option for him. 09-22-2021 patient's wounds are showing signs of excellent improvement. I am actually extremely pleased with where we stand and I think that the patient is making great progress. There does not appear to be any signs of active infection. 09-29-2021 upon evaluation today patient appears to be doing excellent in regard to his wounds. He is actually tolerating the dressing changes without complication. Fortunately I see no evidence of active infection locally or systemically at this time which is great news and overall I am extremely pleased with where we stand currently. 09-2721 upon evaluation today patient actually appears to be doing excellent in regard to his wounds. The left leg is almost completely healed the right ankle is significantly smaller. Overall I am extremely pleased with where we stand at this point. I do not see any evidence of active infection at this time. 10-13-2021 upon evaluation today patient appears to be doing excellent in regard to his wounds. I really feel like he is making good progress here and I am very pleased in that regard. Fortunately  I do not see any signs of active infection at this time. We are using the [facility] topical antibiotic therapy. 10-20-2021 upon evaluation today patient appears to be doing well with regard to his wound on the right medial ankle region the left leg is almost completely healed. I am actually very pleased with where we stand today. 11-03-2021 upon evaluation today patient's wound actually is going require some sharp debridement but appears to be doing much better which is great news. Fortunately I do not see any signs of active infection at this time. 11-10-2021 upon evaluation today patient's wound is actually showing signs of excellent improvement. Fortunately I do not see any evidence of infection locally or systemically which is great news and overall I am extremely pleased with where we stand  today. I do believe he is making good progress he does have his [facility] topical antibiotics with him here today. 11-24-2021 upon evaluation today patient actually showing signs of excellent improvement this appears to be doing much better. Fortunately I do not see any evidence of infection locally or systemically at this time. Objective Constitutional Well-nourished and well-hydrated in no acute distress. [name], [name] ([id number]) [id number] Page 8 of 9 Vitals Time Taken: 2:55 PM, Height: 71 in, Weight: 350 lbs, BMI: 48.8, Temperature: 98.5 F, Pulse: 120 bpm, Respiratory Rate: 20 breaths/min, Blood Pressure: 158/86 mmHg. Respiratory normal breathing without difficulty. Psychiatric this patient is able to make decisions and demonstrates good insight into disease process. Alert and Oriented x 3. pleasant and cooperative. General Notes: Patient's wound bed showed evidence of good granulation and epithelization at this point. Fortunately I do not see any evidence of worsening overall which is great news and in general I do believe that we are headed in the right direction here. I do believe is well that the patient is going to require some debridement today and I performed that debridement without complication postdebridement the wound bed appears to be doing significantly better which is great news. No fevers, chills, nausea, vomiting, or diarrhea. Integumentary (Hair, Skin) Wound #7 status is Open. Original cause of wound was Blister. The date acquired was: 07/21/2021. The wound has been in treatment 16 weeks. The wound is located on the Right,Medial Malleolus. The wound measures 4.2cm length x 2.9cm width x 0.2cm depth; 9.566cm^2 area and 1.913cm^3 volume. There is Fat Layer (Subcutaneous Tissue) exposed. There is no tunneling or undermining noted. There is a medium amount of serosanguineous drainage noted. The wound margin is distinct with the outline attached to  the wound base. There is large (67-100%) red, pink granulation within the wound bed. There is a small (1-33%) amount of necrotic tissue within the wound bed including Adherent Slough. The periwound skin appearance exhibited: Maceration. The periwound skin appearance did not exhibit: Callus, Crepitus, Excoriation, Induration, Rash, Scarring, Dry/Scaly, Atrophie Blanche, Cyanosis, Ecchymosis, Hemosiderin Staining, Mottled, Pallor, Rubor, Erythema. 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 There was a Excisional Skin/Subcutaneous Tissue Debridement with a total area of  12.18 sq cm performed by [name], PA. With the following instrument(s): Curette to remove Viable and Non-Viable tissue/material. Material removed includes Subcutaneous Tissue, Slough, Skin: Dermis, and Skin: Epidermis after achieving pain control using Lidocaine 5% topical ointment. A time out was conducted at 15:05, prior to the start of the procedure. A Minimum amount of bleeding was controlled with Pressure. The procedure was tolerated well with a pain level of 0 throughout and a pain level of 0 following the procedure. Post Debridement Measurements: 4.2cm length x 2.9cm width x 0.2cm depth; 1.913cm^3 volume. Character of Wound/Ulcer Post Debridement is improved. Severity of Tissue Post Debridement is: Fat layer exposed. Post  procedure Diagnosis Wound #7: Same as Pre-Procedure Pre-procedure diagnosis of Wound #7 is a Venous Leg Ulcer located on the Right,Medial Malleolus . There was a Three Layer Compression Therapy Procedure by Deon Pilling, RN. Post procedure Diagnosis Wound #7: Same as Pre-Procedure Plan Follow-up Appointments: Return Appointment in 1 week. Margarita Grizzle Wednsday Return Appointment in 2 weeks. Margarita Grizzle on Wednesday Other: - [place] topical compounding antibiotics- bring to your appointments. bring in compression stockings to weekly appts. Anesthetic: (In clinic) Topical Lidocaine 5% applied to wound bed Bathing/ Shower/ Hygiene: May shower with protection but do not get wound dressing(s) wet. Edema Control - Lymphedema / SCD / Other: Elevate legs to the level of the heart or above for 30 minutes daily and/or when sitting, a frequency of: - 3-4 times a day throughout the day. Avoid standing for long periods of time. Exercise regularly Moisturize legs daily. - apply every night before bed to left leg. Compression stocking or Garment 30-40 mm/Hg pressure to: - wear your VIVE compression stocking to left leg. apply in the morning and remove at night. Will apply tubigrip size [name] to left leg. Remove once you get home and use your compression stockings. WOUND #7: - Malleolus Wound Laterality: Right, Medial Peri-Wound Care: Triamcinolone 15 (g) 1 x Per Week/30 Days Discharge Instructions: Use triamcinolone 15 (g) as directed Peri-Wound Care: Sween Lotion (Moisturizing lotion) 1 x Per Week/30 Days JOSHAWA, DUBIN ([id number]) [id number] Page 9 of 9 Discharge Instructions: Apply moisturizing lotion as directed Topical: [facility] topical compounding antibiotics 1 x Per Week/30 Days Discharge Instructions: ****APPLY LIGHTLY**** to medial portion of wound. Prim Dressing: Promogran Prisma Matrix, 4.34 (sq in) (silver collagen) 1 x Per Week/30 Days ary Discharge Instructions:  Moisten collagen with saline or hydrogel Secondary Dressing: ABD Pad, 8x10 1 x Per Week/30 Days Discharge Instructions: Apply over primary dressing as directed. Com pression Wrap: ThreePress (3 layer compression wrap) 1 x Per Week/30 Days Discharge Instructions: Apply three layer use kerlix instead of cotton compression as directed. 1. I am going to suggest that we have the patient go ahead and continue to monitor for any signs of worsening or infection. Based on what I am seeing currently I think is doing quite well in fact we may even look towards stopping the [facility] topical antibiotics over the next 1 to 2 weeks. Obviously right now I think they have done a good job helping and not quite ready to make that decision today. 2. I am going to suggest that we continue with the collagen to cover. 3 we will continue with the 3 layer compression wrap which is doing a great job after applying the ABD pads. We will see patient back for reevaluation in 1 week here in the clinic. If anything worsens or changes patient will contact our office for additional recommendations.  Electronic Signature(s) Signed: 11/24/2021 3:51:23 PM By: [name] PA-C Entered By: [name] on 11/24/2021 15:51:22 -------------------------------------------------------------------------------- SuperBill Details Patient Name: Date of Service: [name], [name] 11/24/2021 Medical Record Number: [id number] Patient Account Number: [id number] Date of Birth/Sex: Treating RN: [birth year] (66 y.o. [name], [name] Primary Care Provider: [name] Other Clinician: Referring Provider: Treating Provider/Extender: [name] in Treatment: 16 Diagnosis Coding ICD-10 Codes Code Description I89.0 Lymphedema, not elsewhere classified I87.333 Chronic venous hypertension (idiopathic) with ulcer and inflammation of bilateral lower extremity L97.828 Non-pressure chronic ulcer of other part of left  lower leg with other specified severity L97.818 Non-pressure chronic ulcer of other part of right lower leg with other specified severity E11.621 Type 2 diabetes mellitus with foot ulcer L97.518 Non-pressure chronic ulcer of other part of right foot with other specified severity Facility Procedures : 3 CPT4 Code: 0102725 Description: [place] - DEB SUBQ TISSUE 20 SQ CM/< ICD-10 Diagnosis Description L97.828 Non-pressure chronic ulcer of other part of left lower leg with other specified Modifier: severity Quantity: 1 Physician Procedures : CPT4 Code Description Modifier [id number] - WC PHYS SUBQ TISS 20 SQ CM ICD-10 Diagnosis Description L97.828 Non-pressure 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

## 2021-11-24 NOTE — Progress Notes (Signed)
LYLE, NIBLETT ([id number]) [id number] Page 1 of 1 Visit Report for 11/17/2021 SuperBill Details Patient Name: Date of Service: [name], [name] 11/17/2021 Medical Record Number: [id number] Patient Account Number: [id number] Date of Birth/Sex: Treating RN: October 01, 1955 (66 y.o. M) Primary Care Provider: [name] Other Clinician: Referring Provider: Treating Provider/Extender: Estelle Grumbles in Treatment: 15 Diagnosis Coding ICD-10 Codes Code Description I89.0 Lymphedema, not elsewhere classified I87.333 Chronic venous hypertension (idiopathic) with ulcer and inflammation of bilateral lower extremity L97.828 Non-pressure chronic ulcer of other part of left lower leg with other specified severity L97.818 Non-pressure chronic ulcer of other part of right lower leg with other specified severity E11.621 Type 2 diabetes mellitus with foot ulcer L97.518 Non-pressure chronic ulcer of other part of right foot with other specified severity Facility Procedures CPT4 Code Description Modifier Quantity [id number] (Facility Use Only) [id number] - APPLY MULTLAY COMPRS LWR RT LEG 1 Electronic Signature(s) Signed: 11/17/2021 3:44:50 PM By: Erenest Blank Signed: 11/23/2021 8:29:54 PM By: Linton Ham MD Entered By: Erenest Blank on 11/17/2021 15:42:03

## 2021-11-25 NOTE — Progress Notes (Signed)
TYAN, DY ([id number]) [id number] Page 1 of 6 Visit Report for 11/24/2021 Arrival Information Details Patient Name: Date of Service: [name], [name] 11/24/2021 2:30 PM Medical Record Number: [id number] Patient Account Number: [id number] Date of Birth/Sex: Treating RN: [birth year] (66 y.o. [name], [name] Primary Care [name]: [name] Other Clinician: Referring [name]: Treating [name]/Extender: [name] in Treatment: 16 Visit Information History Since Last Visit Added or deleted any medications: No Patient Arrived: [name] Any new allergies or adverse reactions: No Arrival Time: 14:53 Had a fall or experienced change in No Accompanied By: friend activities of daily living that may affect Transfer Assistance: None risk of falls: Patient Identification Verified: Yes Signs or symptoms of abuse/neglect since last visito No Secondary Verification Process Completed: Yes Hospitalized since last visit: No Patient Requires Transmission-Based Precautions: No Implantable device outside of the clinic excluding No Patient Has Alerts: Yes cellular tissue based products placed in the center Patient Alerts: Patient on Blood Thinner since last visit: 01/2021 ABI L 1.13 R 1.06 Has Dressing in Place as Prescribed: Yes 01/2021 TBI L amp R 0.8 Has Compression in Place as Prescribed: Yes Pain Present Now: No Electronic Signature(s) Signed: 11/24/2021 5:03:28 PM By: [name] Pilling RN, BSN Entered By: [name] on 11/24/2021 14:53:38 -------------------------------------------------------------------------------- Compression Therapy Details Patient Name: Date of Service: [name], [name] [name]. 11/24/2021 2:30 PM Medical Record Number: [id number] Patient Account Number: [id number] Date of Birth/Sex: Treating RN: 03-03-1955 (66 y.o. [name] Primary Care [name]: [name] Other Clinician: Referring Halayna Blane: Treating  [name]/Extender: [name] in Treatment: 16 Compression Therapy Performed for Wound Assessment: Wound #7 Right,Medial Malleolus Performed By: Clinician [name] Pilling, RN Compression Type: Three Layer Post Procedure Diagnosis Same as Pre-procedure Electronic Signature(s) Signed: 11/24/2021 5:03:28 PM By: [name] Pilling RN, BSN Entered By: [name] on 11/24/2021 15:08:00 Bare, [name] ([id number]) [id number] Page 2 of 6 -------------------------------------------------------------------------------- Encounter Discharge Information Details Patient Name: Date of Service: [name] 11/24/2021 2:30 PM Medical Record Number: [id number] Patient Account Number: [id number] Date of Birth/Sex: Treating RN: 28-Jul-1955 (66 y.o. [name] Primary Care Makell Cyr: [name] Other Clinician: Referring [name]: Treating [name]/Extender: [name] in Treatment: [phone] Encounter Discharge Information Items Post Procedure Vitals Discharge Condition: Stable Temperature (F): 98.5 Ambulatory Status: Walker Pulse (bpm): 69 Discharge Destination: Home Respiratory Rate (breaths/min): 20 Transportation: Private Auto Blood Pressure (mmHg): 158/86 Accompanied By: friend Schedule Follow-up Appointment: Yes Clinical Summary of Care: Electronic Signature(s) Signed: 11/24/2021 5:03:28 PM By: [name] Pilling RN, BSN Entered By: [name] on 11/24/2021 15:12:43 -------------------------------------------------------------------------------- Lower Extremity Assessment Details Patient Name: Date of Service: [name], [name] [name]. 11/24/2021 2:30 PM Medical Record Number: [id number] Patient Account Number: [id number] Date of Birth/Sex: Treating RN: October 29, 1955 (66 y.o. [name] Primary Care Tazia Illescas: [name] Other Clinician: Referring [name]: Treating [name]/Extender: [name] in Treatment: 16 Edema Assessment Assessed: [name]: No] [name]: Yes] Edema: [Left: Yes] [Right: No] Calf Left: Right: Point of Measurement: 31 cm From Medial Instep 36 cm Ankle Left: Right: Point of Measurement: 11 cm From Medial Instep 25 cm Vascular Assessment Pulses: Dorsalis Pedis Palpable: [Right:Yes] Electronic Signature(s) Signed: 11/24/2021 5:03:28 PM By: [name] Pilling RN, BSN Entered By: [name] on 11/24/2021 15:02:47 [name] Details -------------------------------------------------------------------------------- [name] ([id number]) [id number] Page 3 of 6 Patient Name: Date of Service: [name], [name] 11/24/2021 2:30 PM Medical Record Number: [id number] Patient Account  Number: [id number] Date of Birth/Sex: Treating RN: [birth year] (66 y.o. [name] Primary Care [name]: [name] Other Clinician: Referring [name]: Treating [name]/Extender: [name] in Treatment: [name] reviewed with physician Active Inactive Pain, Acute or Chronic Nursing Diagnoses: Pain, acute or chronic: actual or potential Potential alteration in comfort, pain Goals: Patient will verbalize adequate pain control and receive pain control interventions during procedures as needed Date Initiated: 08/04/2021 Target Resolution Date: 12/03/2021 Goal Status: Active Patient/caregiver will verbalize comfort level met Date Initiated: 08/04/2021 Target Resolution Date: 12/03/2021 Goal Status: Active Interventions: Encourage patient to take pain medications as prescribed Provide education on pain management Reposition patient for comfort Treatment Activities: Administer pain control measures as ordered : 08/04/2021 Notes: Electronic Signature(s) Signed: 11/24/2021 5:03:28 PM By: [name] Pilling RN, BSN Entered By: [name] on 11/24/2021  15:11:30 -------------------------------------------------------------------------------- Pain Assessment Details Patient Name: Date of Service: [name], [name] [name]. 11/24/2021 2:30 PM Medical Record Number: [id number] Patient Account Number: [id number] Date of Birth/Sex: Treating RN: 1955/02/26 (66 y.o. [name] Primary Care Araceli Arango: [name] Other Clinician: Referring Keani Gotcher: Treating Caylon Saine/Extender: [name] in Treatment: 16 Active Problems Location of Pain Severity and Description of Pain Patient Has Paino No Site Locations Rate the pain. SHADRACH, BARTUNEK ([id number]) [id number] Page 4 of 6 Rate the pain. Current Pain Level: 0 Pain Management and Medication Current Pain Management: Medication: No Cold Application: No Rest: No Massage: No Activity: No T.E.[name].S.: No Heat Application: No Leg drop or elevation: No Is the Current Pain Management Adequate: Adequate How does your wound impact your activities of daily livingo Sleep: No Bathing: No Appetite: No Relationship With Others: No Bladder Continence: No Emotions: No Bowel Continence: No Work: No Toileting: No Drive: No Dressing: No Hobbies: No Engineer, maintenance) Signed: 11/24/2021 5:03:28 PM By: [name] Pilling RN, BSN Entered By: [name] on 11/24/2021 15:02:23 -------------------------------------------------------------------------------- Patient/Caregiver Education Details Patient Name: Date of Service: [name] Gloss [name]. 11/15/2023andnbsp2:30 PM Medical Record Number: [id number] Patient Account Number: [id number] Date of Birth/Gender: Treating RN: 1955/07/14 (66 y.o. [name] Primary Care Physician: [name] Other Clinician: Referring Physician: Treating Physician/Extender: [name] in Treatment: 16 Education Assessment Education Provided To: Patient Education Topics Provided Pain: Handouts:  A Guide to Pain Control Methods: Explain/Verbal Responses: Reinforcements needed Electronic Signature(s) Signed: 11/24/2021 5:03:28 PM By: [name] Pilling RN, BSN Entered By: [name] on 11/24/2021 15:11:42 Rabine, [name] ([id number]) [id number] Page 5 of 6 -------------------------------------------------------------------------------- Wound Assessment Details Patient Name: Date of Service: [name], [name] 11/24/2021 2:30 PM Medical Record Number: [id number] Patient Account Number: [id number] Date of Birth/Sex: Treating RN: March 28, 1955 (66 y.o. [name], [name] Primary Care Josefita Weissmann: [name] Other Clinician: Referring Alejandra Barna: Treating Kilynn Fitzsimmons/Extender: [name] in Treatment: 16 Wound Status Wound Number: 7 Primary Venous Leg Ulcer Etiology: Wound Location: Right, Medial Malleolus Secondary Lymphedema Wounding Event: Blister Etiology: Date Acquired: 07/21/2021 Wound Open Weeks Of Treatment: 16 Status: Clustered Wound: No Comorbid Sleep Apnea, Arrhythmia, Congestive Heart Failure, History: Hypertension, Hypotension, Peripheral Arterial Disease, Peripheral Venous Disease, Type II Diabetes, Gout, Osteoarthritis Photos Wound Measurements Length: (cm) Width: (cm) Depth: (cm) Area: (cm) Volume: (cm) 4.2 % Reduction in Area: 75% 2.9 % Reduction in Volume: 92.9% 0.2 Epithelialization: Medium (34-66%) 9.566 Tunneling: No 1.913 Undermining: No Wound Description Classification: Full Thickness With Exposed Suppo Wound Margin: Distinct, outline attached Exudate Amount: Medium Exudate Type: Serosanguineous Exudate Color: red,  brown rt Structures Foul Odor After Cleansing: No Slough/Fibrino Yes Wound Bed Granulation Amount: Large (67-100%) Exposed Structure Granulation Quality: Red, Pink Fascia Exposed: No Necrotic Amount: Small (1-33%) Fat Layer (Subcutaneous Tissue) Exposed: Yes Necrotic Quality: Adherent  Slough Tendon Exposed: No Muscle Exposed: No Joint Exposed: No Bone Exposed: No Periwound Skin Texture Texture Color No Abnormalities Noted: No No Abnormalities Noted: No Callus: No Atrophie Blanche: No Crepitus: No Cyanosis: No Excoriation: No Ecchymosis: No Induration: No Erythema: No Rash: No Hemosiderin Staining: No Scarring: No Mottled: No Pallor: No Moisture Rubor: No No Abnormalities Noted: No [name], [name] ([id number]) [id number] Page 6 of 6 Dry / Scaly: No Maceration: Yes Treatment Notes Wound #7 (Malleolus) Wound Laterality: Right, Medial Cleanser Peri-Wound Care Triamcinolone 15 (g) Discharge Instruction: Use triamcinolone 15 (g) as directed Sween Lotion (Moisturizing lotion) Discharge Instruction: Apply moisturizing lotion as directed Topical Keystone topical compounding antibiotics Discharge Instruction: ****APPLY LIGHTLY**** to medial portion of wound. Primary Dressing Promogran Prisma Matrix, 4.34 (sq in) ([name] collagen) Discharge Instruction: Moisten collagen with saline or hydrogel Secondary Dressing ABD Pad, 8x10 Discharge Instruction: Apply over primary dressing as directed. Secured With Compression Wrap ThreePress (3 layer compression wrap) Discharge Instruction: Apply three layer use kerlix instead of cotton compression as directed. Compression Stockings Add-Ons Electronic Signature(s) Signed: 11/24/2021 5:03:28 PM By: [name] Pilling RN, BSN Entered By: [name] on 11/24/2021 15:06:52 -------------------------------------------------------------------------------- Vitals Details Patient Name: Date of Service: [name], [name] 11/24/2021 2:30 PM Medical Record Number: [id number] Patient Account Number: [id number] Date of Birth/Sex: Treating RN: Apr 20, 1955 (66 y.o. [name], [name] Primary Care Devonte Migues: [name] Other Clinician: Referring Nakai Pollio: Treating Frieda Arnall/Extender: [name] in Treatment: 16 Vital Signs Time Taken: 14:55 Temperature (F): 98.5 Height (in): 71 Pulse (bpm): 120 Weight (lbs): 350 Respiratory Rate (breaths/min): 20 Body Mass Index (BMI): 48.8 Blood Pressure (mmHg): 158/86 Reference Range: 80 - 120 mg / dl Electronic Signature(s) Signed: 11/24/2021 5:03:28 PM By: [name] Pilling RN, BSN Entered By: [name] on 11/24/2021 15:02:13

## 2021-11-26 ENCOUNTER — Other Ambulatory Visit (HOSPITAL_COMMUNITY): Payer: Self-pay | Admitting: Cardiology

## 2021-11-26 DIAGNOSIS — I5022 Chronic systolic (congestive) heart failure: Secondary | ICD-10-CM

## 2021-11-26 NOTE — Addendum Note (Signed)
Addended by: Caedyn Tassinari, Sharlot Gowda on: 11/26/2021 03:53 PM   Modules accepted: Orders

## 2021-11-29 ENCOUNTER — Other Ambulatory Visit (HOSPITAL_COMMUNITY): Payer: Medicare Other

## 2021-11-29 DIAGNOSIS — I5020 Unspecified systolic (congestive) heart failure: Secondary | ICD-10-CM | POA: Diagnosis not present

## 2021-11-30 LAB — BASIC METABOLIC PANEL
BUN/Creatinine Ratio: 25 — ABNORMAL HIGH (ref 10–24)
BUN: 20 mg/dL (ref 8–27)
CO2: 20 mmol/L (ref 20–29)
Calcium: 9.5 mg/dL (ref 8.6–10.2)
Chloride: 103 mmol/L (ref 96–106)
Creatinine, Ser: 0.8 mg/dL (ref 0.76–1.27)
Glucose: 147 mg/dL — ABNORMAL HIGH (ref 70–99)
Potassium: 4.2 mmol/L (ref 3.5–5.2)
Sodium: 140 mmol/L (ref 134–144)
eGFR: 98 mL/min/{1.73_m2} (ref >59)

## 2021-11-30 NOTE — Telephone Encounter (Signed)
Advanced Heart Failure Patient Advocate Encounter   Patient was approved to receive Entresto from [employer]  Effective dates: 11/30/21 through 01/10/23  Document scanned to chart.

## 2021-12-01 ENCOUNTER — Encounter (HOSPITAL_BASED_OUTPATIENT_CLINIC_OR_DEPARTMENT_OTHER): Payer: Medicare Other | Admitting: Physician Assistant

## 2021-12-01 DIAGNOSIS — L97828 Non-pressure chronic ulcer of other part of left lower leg with other specified severity: Secondary | ICD-10-CM | POA: Diagnosis not present

## 2021-12-01 DIAGNOSIS — I87333 Chronic venous hypertension (idiopathic) with ulcer and inflammation of bilateral lower extremity: Secondary | ICD-10-CM | POA: Diagnosis not present

## 2021-12-01 DIAGNOSIS — L97312 Non-pressure chronic ulcer of right ankle with fat layer exposed: Secondary | ICD-10-CM | POA: Diagnosis not present

## 2021-12-01 DIAGNOSIS — I872 Venous insufficiency (chronic) (peripheral): Secondary | ICD-10-CM | POA: Diagnosis not present

## 2021-12-01 DIAGNOSIS — L97518 Non-pressure chronic ulcer of other part of right foot with other specified severity: Secondary | ICD-10-CM | POA: Diagnosis not present

## 2021-12-01 DIAGNOSIS — I890 Lymphedema, not elsewhere classified: Secondary | ICD-10-CM | POA: Diagnosis not present

## 2021-12-01 DIAGNOSIS — E11621 Type 2 diabetes mellitus with foot ulcer: Secondary | ICD-10-CM | POA: Diagnosis not present

## 2021-12-01 DIAGNOSIS — L97818 Non-pressure chronic ulcer of other part of right lower leg with other specified severity: Secondary | ICD-10-CM | POA: Diagnosis not present

## 2021-12-01 NOTE — Telephone Encounter (Signed)
Advanced Heart Failure Patient Advocate Encounter  Left message on patient voicemail confirming Entresto approval. Provided contact number for [employer] so that patient can schedule first delivery.

## 2021-12-01 NOTE — Progress Notes (Signed)
[name], [name] ([id number]) [id number] Page 1 of 10 Visit Report for 12/01/2021 Chief Complaint Document Details Patient Name: Date of Service: [name], [name] 12/01/2021 2:45 PM Medical Record Number: [id number] Patient Account Number: [id number] Date of Birth/Sex: Treating RN: 15-Feb-1955 (66 y.o. M) Primary Care Provider: [name] Other Clinician: Referring Provider: Treating Provider/Extender: [name] in Treatment: 17 Information Obtained from: Patient Chief Complaint 08/04/2021; patient returns to clinic with bilateral leg wounds as well as areas on the right foot Electronic Signature(s) Signed: 12/01/2021 2:54:12 PM By: [name] PA-C Entered By: [name] on 12/01/2021 14:54:12 -------------------------------------------------------------------------------- Debridement Details Patient Name: Date of Service: [name], [name] 12/01/2021 2:45 PM Medical Record Number: [id number] Patient Account Number: [id number] Date of Birth/Sex: Treating RN: 1955-04-11 (66 y.o. M) Primary Care Provider: [name] Other Clinician: Referring Provider: Treating Provider/Extender: [name] in Treatment: 17 Debridement Performed for Assessment: Wound #7 Right,Medial Malleolus Performed By: Physician [name], PA Debridement Type: Debridement Severity of Tissue Pre Debridement: Fat layer exposed Level of Consciousness (Pre-procedure): Awake and Alert Pre-procedure Verification/Time Out Yes - 15:34 Taken: Start Time: 15:34 Pain Control: Lidocaine T Area Debrided (L x W): otal 4.2 (cm) x 1.2 (cm) = 5.04 (cm) Tissue and other material debrided: Viable, Non-Viable, Slough, Subcutaneous, Slough Level: Skin/Subcutaneous Tissue Debridement Description: Excisional Instrument: Curette Bleeding: Minimum Hemostasis Achieved: Pressure End Time: 15:34 Procedural Pain: 0 Post Procedural Pain:  0 Response to Treatment: Procedure was tolerated well Level of Consciousness (Post- Awake and Alert procedure): Post Debridement Measurements of Total Wound Length: (cm) 4.2 Width: (cm) 1.2 Depth: (cm) 0.2 Volume: (cm) 0.792 Character of Wound/Ulcer Post Debridement: Improved Severity of Tissue Post Debridement: Fat layer exposed [name], [name] ([id number]) [id number] Page 2 of 10 Post Procedure Diagnosis Same as Pre-procedure Electronic Signature(s) Signed: 12/01/2021 3:44:12 PM By: [name] PA-C Signed: 12/01/2021 4:51:51 PM By: Erenest Blank Entered By: Erenest Blank on 12/01/2021 15:34:38 -------------------------------------------------------------------------------- HPI Details Patient Name: Date of Service: [name], [name] 12/01/2021 2:45 PM Medical Record Number: [id number] Patient Account Number: [id number] Date of Birth/Sex: Treating RN: 18-Feb-1955 (66 y.o. M) Primary Care Provider: [name] Other Clinician: Referring Provider: Treating Provider/Extender: [name] in Treatment: 17 History of Present Illness HPI Description: ADMISSION 03/22/2021 This is a 66 year old man with a past medical history significant for diabetes type 2, congestive heart failure, peripheral arterial disease, morbid obesity, venous insufficiency, and coronary artery disease. He has been followed by Dr. Earleen [name] in podiatry, who performed a transmetatarsal amputation on the left foot in August 2022. He had issues healing that wound, but based upon Dr. [name] notes, ultimately the TMA wound healed. During his recovery from that surgery, however, ulcers opened up over the DIP joint of the right second and third toe. These have apparently closed and reopened multiple times. It sounds like one of the issues has been moisture accumulation and maceration of the tissues causing them to reopen. At his last visit with Dr. Earleen [name], on  March 01, 2021, there continues to be problems with moisture and he was referred to wound care for further evaluation and management. He had a formal aortogram with runoff performed prior to his TMA. The findings are copied here: Patient has inline flow to both feet with no significant flow-limiting lesion that would be amenable to percutaneous or open revascularization. He does have an element of small vessel disease and has a short  segment occlusion of the distal anterior tibial/dorsalis pedis artery on the left foot but does have posterior tibial artery flow. Would recommend management of wounds with amputation of toes 2 and 3 on the right foot if the wounds do not heal and deteriorate. Transmetatarsal amputation on the left side has as good a blood supply as it is going to get and hopefully this will heal in the future. Formal ABIs were done in January 2023. They are normal bilaterally. ABI Findings: +---------+------------------+-----+----------+--------+ Right Rt Pressure (mmHg)IndexWaveform Comment  +---------+------------------+-----+----------+--------+ Brachial 160     +---------+------------------+-----+----------+--------+ PTA 192 1.06 monophasic  +---------+------------------+-----+----------+--------+ DP 159 0.88 monophasic  +---------+------------------+-----+----------+--------+ Great T oe145 0.80    +---------+------------------+-----+----------+--------+ +--------+------------------+-----+---------+-------+ Left Lt Pressure (mmHg)IndexWaveform Comment +--------+------------------+-----+---------+-------+ [id number]     +--------+------------------+-----+---------+-------+ PTA 204 1.13 triphasic  +--------+------------------+-----+---------+-------+ DP 194 1.07 biphasic   +--------+------------------+-----+---------+-------+ +-------+-----------+-----------+------------+------------+ ABI/TBIT oday's ABIT  oday's TBIPrevious ABIPrevious TBI +-------+-----------+-----------+------------+------------+ Right 1.06 0.8 1.26 0.65  +-------+-----------+-----------+------------+------------+ Left 1.13 amputation 1.15 amputation  +-------+-----------+-----------+------------+------------+ Previous ABI on 08/06/20 at [facility] Pedal pressures falsely elevated due to medial calcification. ANCEL, EASLER ([id number]) [id number] Page 3 of 10 Summary: Right: Resting right ankle-brachial index is within normal range. The right toe-brachial index is normal. Left: Resting left ankle-brachial index is within normal range. READMISSION 08/04/2021 Mr. Gartman is now a 66 year old man who I remember from this clinic many years ago I think he had a right lower extremity predominantly venous wound at the time. He was here for 1 visit in March of this year had wounds on his right second and third toes we apparently dressed them many and they healed so he did not come back. He is listed in [place] is being a diabetic although the patient denies this says he is verified it with his primary doctor. In any case over the last several weeks or so according the patient although these wounds look somewhat more chronic than that he has developed predominantly large wounds on the right medial and right lateral ankle smaller areas on the left leg and areas on the dorsal aspect of his right second and third toes. Its not clear how he has been dressing these. 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 The patient has known PAD. He had an angiogram in August 2022 at which time he had nonhealing wounds in both feet. On the left his major vessels in the thigh were all patent. He had three-vessel patent to the level of the ankle. He had a very short occlusion in the left anterior tibial. On the right lower  extremity the major vessels in his thigh were all patent. He had three-vessel runoff to the foot sluggish filling of the anterior tibial artery. He was felt to have some component of small vessel disease but nothing that was amenable or needed revascularization. It was recommended that he have amputation of the second and third toes on the right foot if they did not heal He has been following with Dr. Earleen [name] of podiatry. Dr. Earleen [name] got him juxta lite stockings although I do not think he had them on properly he has uncontrolled edema in both legs Past medical history includes type 2 diabetes [although the patient really denies this], left TMA in 2022,lower extremity wounds in fact attendance at this clinic in 2009-2010, A-fib on Eliquis, chronic venous insufficiency with secondary lymphedema history of non-Hodgkin's lymphoma. 08-11-2021 upon evaluation today patient presents for follow-up evaluation he  was seen last Wednesday initially for inspection here in our clinic. With that being said he tells me that he unfortunately has been having a lot of drainage and is actually coming through his wrap. Fortunately I do not see any evidence of active infection locally or systemically at this time which is great news. No fevers, chills, nausea, vomiting, or diarrhea. With that being said there does appear to be some evidence of local infection based on what I am seeing today. 08-18-2021 upon evaluation today patient appears to be doing okay currently in regard to his wounds with that being said that he is doing much better but still has a long ways to go to get to where he wanted to be. I think the infection is significantly improved. He has another week of the antibiotic at this point. 08-25-2021 upon evaluation today patient's wounds are actually doing decently well he has erythema has significantly improved. I think the cellulitis is under controlled I am going to continue him on 2 more weeks of the Levaquin at  500 mg this is a lower dose but I am hoping it will be better for him. 09-08-2021 upon evaluation today patient appears to be doing excellent in regard to his wounds. Since I last saw him he was actually in the hospital where he ended up having a pacemaker put in. Subsequently he tells me that he is actually doing quite well although they were unsure whether they were going to do it due to the fact that he had the wounds on his legs. And then I am glad they did anything seem to be doing well. 09-15-2021 upon evaluation patient's wounds are actually showing signs of improvement. The right side wounds do appear to have some need for sharp debridement today and I Georgina Peer go ahead and proceed with that. I think that if we get the wounds cleaned up he will actually show signs of continued improvement. I am also leaning towards switching to [facility] which I think will be a much better option for him. 09-22-2021 patient's wounds are showing signs of excellent improvement. I am actually extremely pleased with where we stand and I think that the patient is making great progress. There does not appear to be any signs of active infection. 09-29-2021 upon evaluation today patient appears to be doing excellent in regard to his wounds. He is actually tolerating the dressing changes without complication. Fortunately I see no evidence of active infection locally or systemically at this time which is great news and overall I am extremely pleased with where we stand currently. 09-2721 upon evaluation today patient actually appears to be doing excellent in regard to his wounds. The left leg is almost completely healed the right ankle is significantly smaller. Overall I am extremely pleased with where we stand at this point. I do not see any evidence of active infection at this time. 10-13-2021 upon evaluation today patient appears to be doing excellent in regard to his wounds. I really feel like he is making good progress  here and I am very pleased in that regard. Fortunately I do not see any signs of active infection at this time. We are using the [facility] topical antibiotic therapy. 10-20-2021 upon evaluation today patient appears to be doing well with regard to his wound on the right medial ankle region the left leg is almost completely healed. I am actually very pleased with where we stand today. 11-03-2021 upon evaluation today patient's wound actually is going require  some sharp debridement but appears to be doing much better which is great news. Fortunately I do not see any signs of active infection at this time. 11-10-2021 upon evaluation today patient's wound is actually showing signs of excellent improvement. Fortunately I do not see any evidence of infection locally or systemically which is great news and overall I am extremely pleased with where we stand today. I do believe he is making good progress he does have his [facility]stone topical antibiotics with him here today. 11-24-2021 upon evaluation today patient actually showing signs of excellent improvement this appears to be doing much better. Fortunately I do not see any evidence of infection locally or systemically at this time. 12-01-2021 upon evaluation today patient appears to be doing well currently in regard to his wound. He has been tolerating the dressing changes without complication. Fortunately I do not see any evidence of active infection at this time which is great news and overall I am extremely pleased with where we stand today. Electronic Signature(s) Signed: 12/01/2021 3:41:31 PM By: [name] PA-C Entered By: [name] on 12/01/2021 15:41:31 [place], Kasandra Knudsen D ([id number]) [id number] Page 4 of 10 -------------------------------------------------------------------------------- Physical Exam Details Patient Name: Date of Service: JAMICHAEL, KNOTTS 12/01/2021 2:45 PM Medical Record Number:  [id number] Patient Account Number: [id number] Date of Birth/Sex: Treating RN: 14-Jul-1955 (66 y.o. M) Primary Care Provider: [name] Other Clinician: Referring Provider: Treating Provider/Extender: [name] in Treatment: 36 Constitutional Well-nourished and well-hydrated in no acute distress. Respiratory normal breathing without difficulty. Psychiatric this patient is able to make decisions and demonstrates good insight into disease process. Alert and Oriented x 3. pleasant and cooperative. Notes Patient's wound bed did require sharp debridement I am going to perform that today and the patient tolerated that without complication. Postdebridement the wound bed appears to be doing significantly better which is great news and overall I am extremely pleased with where we stand currently. Electronic Signature(s) Signed: 12/01/2021 3:41:47 PM By: [name] PA-C Entered By: [name] on 12/01/2021 15:41:47 -------------------------------------------------------------------------------- Physician Orders Details Patient Name: Date of Service: [place], [name] 12/01/2021 2:45 PM Medical Record Number: [id number] Patient Account Number: [id number] Date of Birth/Sex: Treating RN: 1955/04/05 (66 y.o. M) Primary Care Provider: [name] Other Clinician: Referring Provider: Treating Provider/Extender: [name] in Treatment: [phone] Verbal / Phone Orders: No Diagnosis Coding ICD-10 Coding Code Description I89.0 Lymphedema, not elsewhere classified I87.333 Chronic venous hypertension (idiopathic) with ulcer and inflammation of bilateral lower extremity L97.828 Non-pressure chronic ulcer of other part of left lower leg with other specified severity L97.818 Non-pressure chronic ulcer of other part of right lower leg with other specified severity E11.621 Type 2 diabetes mellitus with foot ulcer L97.518 Non-pressure chronic ulcer of  other part of right foot with other specified severity Follow-up Appointments ppointment in 1 week. Margarita Grizzle Wednsday Return A ppointment in 2 weeks. Margarita Grizzle on Wednesday Return A Other: - [place] topical compounding antibiotics- bring to your appointments. ***only apply to medial portion of wound that's open*** bring in compression stockings to weekly appts. Anesthetic (In clinic) Topical Lidocaine 5% applied to wound bed Bathing/ Shower/ Hygiene May shower with protection but do not get wound dressing(s) wet. BARLOW, HARRISON ([id number]) [id number] Page 5 of 10 Edema Control - Lymphedema / SCD / Other Elevate legs to the level of the heart or above for 30 minutes daily and/or when sitting, a frequency of: -  3-4 times a day throughout the day. Avoid standing for long periods of time. Exercise regularly Moisturize legs daily. - apply every night before bed to left leg. Compression stocking or Garment 30-40 mm/Hg pressure to: - wear your VIVE compression stocking to left leg. apply in the morning and remove at night. Will apply tubigrip size D to left leg. Remove once you get home and use your compression stockings. Wound Treatment Wound #7 - Malleolus Wound Laterality: Right, Medial Peri-Wound Care: Triamcinolone 15 (g) 1 x Per Week/30 Days Discharge Instructions: Use triamcinolone 15 (g) as directed Peri-Wound Care: Sween Lotion (Moisturizing lotion) 1 x Per Week/30 Days Discharge Instructions: Apply moisturizing lotion as directed Topical: Keystone topical compounding antibiotics 1 x Per Week/30 Days Discharge Instructions: ****APPLY LIGHTLY**** to medial portion of wound. Prim Dressing: Promogran Prisma Matrix, 4.34 (sq in) (silver collagen) 1 x Per Week/30 Days ary Discharge Instructions: Moisten collagen with saline or hydrogel Secondary Dressing: ABD Pad, 8x10 1 x Per Week/30 Days Discharge Instructions: Apply over primary dressing as  directed. Compression Wrap: ThreePress (3 layer compression wrap) 1 x Per Week/30 Days Discharge Instructions: Apply three layer use kerlix instead of cotton compression as directed. Electronic Signature(s) Signed: 12/01/2021 3:44:12 PM By: [name] PA-C Signed: 12/01/2021 4:51:51 PM By: Erenest Blank Entered By: Erenest Blank on 12/01/2021 15:33:27 -------------------------------------------------------------------------------- Problem List Details Patient Name: Date of Service: Draughn, [name] 12/01/2021 2:45 PM Medical Record Number: [id number] Patient Account Number: [id number] Date of Birth/Sex: Treating RN: [birth year] (66 y.o. M) Primary Care Provider: [name] Other Clinician: Referring Provider: Treating Provider/Extender: [name] in Treatment: 17 Active Problems ICD-10 Encounter Code Description Active Date MDM Diagnosis I89.0 Lymphedema, not elsewhere classified 08/04/2021 No Yes I87.333 Chronic venous hypertension (idiopathic) with ulcer and inflammation of 08/04/2021 No Yes bilateral lower extremity L97.828 Non-pressure chronic ulcer of other part of left lower leg with other specified 08/04/2021 No Yes severity L97.818 Non-pressure chronic ulcer of other part of right lower leg with other specified 08/04/2021 No Yes severity E11.621 Type 2 diabetes mellitus with foot ulcer 08/04/2021 No Yes [place], Zakariyah D ([id number]) [id number] Page 6 of 10 L97.518 Non-pressure chronic ulcer of other part of right foot with other specified 08/04/2021 No Yes severity Inactive Problems Resolved Problems Electronic Signature(s) Signed: 12/01/2021 2:54:06 PM By: [name] PA-C Entered By: [name] on 12/01/2021 14:54:06 -------------------------------------------------------------------------------- Progress Note Details Patient Name: Date of Service: O[place], [name] 12/01/2021 2:45 PM Medical Record  Number: [id number] Patient Account Number: [id number] Date of Birth/Sex: Treating RN: 10/09/1955 (66 y.o. M) Primary Care Provider: [name] Other Clinician: Referring Provider: Treating Provider/Extender: [name] in Treatment: 81 Subjective Chief Complaint Information obtained from Patient 08/04/2021; patient returns to clinic with bilateral leg wounds as well as areas on the right foot History of Present Illness (HPI) ADMISSION 03/22/2021 This is a 66 year old man with a past medical history significant for diabetes type 2, congestive heart failure, peripheral arterial disease, morbid obesity, venous insufficiency, and coronary artery disease. He has been followed by Dr. Earleen [name] in podiatry, who performed a transmetatarsal amputation on the left foot in August 2022. He had issues healing that wound, but based upon Dr. [name] notes, ultimately the TMA wound healed. During his recovery from that surgery, however, ulcers opened up over the DIP joint of the right second and third toe. These have apparently closed and reopened multiple times. It sounds like one of the issues  has been moisture accumulation and maceration of the tissues causing them to reopen. At his last visit with Dr. Earleen [name], on March 01, 2021, there continues to be problems with moisture and he was referred to wound care for further evaluation and management. He had a formal aortogram with runoff performed prior to his TMA. The findings are copied here: Patient has inline flow to both feet with no significant flow-limiting lesion that would be amenable to percutaneous or open revascularization. He does have an element of small vessel disease and has a short segment occlusion of the distal anterior tibial/dorsalis pedis artery on the left foot but does have posterior tibial artery flow. Would recommend management of wounds with amputation of toes 2 and 3 on the right foot if the wounds do not  heal and deteriorate. Transmetatarsal amputation on the left side has as good a blood supply as it is going to get and hopefully this will heal in the future. Formal ABIs were done in January 2023. They are normal bilaterally. ABI Findings: +---------+------------------+-----+----------+--------+ Right Rt Pressure (mmHg)IndexWaveform Comment  +---------+------------------+-----+----------+--------+ Brachial 160    +---------+------------------+-----+----------+--------+ PTA 192 1.06 monophasic  +---------+------------------+-----+----------+--------+ DP 159 0.88 monophasic  +---------+------------------+-----+----------+--------+ Great T oe145 0.80    +---------+------------------+-----+----------+--------+ +--------+------------------+-----+---------+-------+ Left Lt Pressure (mmHg)IndexWaveform Comment +--------+------------------+-----+---------+-------+ [id number]    +--------+------------------+-----+---------+-------+ PTA 204 1.13 triphasic  +--------+------------------+-----+---------+-------+ DP 194 1.07 biphasic   +--------+------------------+-----+---------+-------+ +-------+-----------+-----------+------------+------------+ Delray Alt D ([id number]) [id number] Page 7 of 10 ABI/TBIT oday's ABIT oday's TBIPrevious ABIPrevious TBI +-------+-----------+-----------+------------+------------+ Right 1.06 0.8 1.26 0.65  +-------+-----------+-----------+------------+------------+ Left 1.13 amputation 1.15 amputation  +-------+-----------+-----------+------------+------------+ Previous ABI on 08/06/20 at [facility] Pedal pressures falsely elevated due to medial calcification. Summary: Right: Resting right ankle-brachial index is within normal range. The right toe-brachial index is normal. Left: Resting left ankle-brachial index is within normal range. READMISSION 08/04/2021 Mr.  Cosman is now a 66 year old man who I remember from this clinic many years ago I think he had a right lower extremity predominantly venous wound at the time. He was here for 1 visit in March of this year had wounds on his right second and third toes we apparently dressed them many and they healed so he did not come back. He is listed in [employer] is being a diabetic although the patient denies this says he is verified it with his primary doctor. In any case over the last several weeks or so according the patient although these wounds look somewhat more chronic than that he has developed predominantly large wounds on the right medial and right lateral ankle smaller areas on the left leg and areas on the dorsal aspect of his right second and third toes. Its not clear how he has been dressing these. 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 The patient has known PAD. He had an angiogram in August 2022 at which time he had nonhealing wounds in both feet. On the left his major vessels in the thigh were all patent. He had three-vessel patent to the level of the ankle. He had a very short occlusion in the left anterior tibial. On the right lower extremity the major vessels in his thigh were all patent. He had three-vessel runoff to the foot sluggish filling of the anterior tibial artery. He was felt to have some component of small vessel disease but nothing that was amenable or needed revascularization. It was recommended that he have amputation of the second and third toes  on the right foot if they did not heal He has been following with Dr. Earleen [name] of podiatry. Dr. Earleen [name] got him juxta lite stockings although I do not think he had them on properly he has uncontrolled edema in both legs Past medical history includes type 2 diabetes [although the patient really denies this], left TMA in 2022,lower extremity wounds in fact attendance  at this clinic in 2009-2010, A-fib on Eliquis, chronic venous insufficiency with secondary lymphedema history of non-Hodgkin's lymphoma. 08-11-2021 upon evaluation today patient presents for follow-up evaluation he was seen last Wednesday initially for inspection here in our clinic. With that being said he tells me that he unfortunately has been having a lot of drainage and is actually coming through his wrap. Fortunately I do not see any evidence of active infection locally or systemically at this time which is great news. No fevers, chills, nausea, vomiting, or diarrhea. With that being said there does appear to be some evidence of local infection based on what I am seeing today. 08-18-2021 upon evaluation today patient appears to be doing okay currently in regard to his wounds with that being said that he is doing much better but still has a long ways to go to get to where he wanted to be. I think the infection is significantly improved. He has another week of the antibiotic at this point. 08-25-2021 upon evaluation today patient's wounds are actually doing decently well he has erythema has significantly improved. I think the cellulitis is under controlled I am going to continue him on 2 more weeks of the Levaquin at 500 mg this is a lower dose but I am hoping it will be better for him. 09-08-2021 upon evaluation today patient appears to be doing excellent in regard to his wounds. Since I last saw him he was actually in the hospital where he ended up having a pacemaker put in. Subsequently he tells me that he is actually doing quite well although they were unsure whether they were going to do it due to the fact that he had the wounds on his legs. And then I am glad they did anything seem to be doing well. 09-15-2021 upon evaluation patient's wounds are actually showing signs of improvement. The right side wounds do appear to have some need for sharp debridement today and I Georgina Peer go ahead and proceed with  that. I think that if we get the wounds cleaned up he will actually show signs of continued improvement. I am also leaning towards switching to [facility] which I think will be a much better option for him. 09-22-2021 patient's wounds are showing signs of excellent improvement. I am actually extremely pleased with where we stand and I think that the patient is making great progress. There does not appear to be any signs of active infection. 09-29-2021 upon evaluation today patient appears to be doing excellent in regard to his wounds. He is actually tolerating the dressing changes without complication. Fortunately I see no evidence of active infection locally or systemically at this time which is great news and overall I am extremely pleased with where we stand currently. 09-2721 upon evaluation today patient actually appears to be doing excellent in regard to his wounds. The left leg is almost completely healed the right ankle is significantly smaller. Overall I am extremely pleased with where we stand at this point. I do not see any evidence of active infection at this time. 10-13-2021 upon evaluation today patient appears to be doing excellent  in regard to his wounds. I really feel like he is making good progress here and I am very pleased in that regard. Fortunately I do not see any signs of active infection at this time. We are using the [facility] topical antibiotic therapy. 10-20-2021 upon evaluation today patient appears to be doing well with regard to his wound on the right medial ankle region the left leg is almost completely healed. I am actually very pleased with where we stand today. 11-03-2021 upon evaluation today patient's wound actually is going require some sharp debridement but appears to be doing much better which is great news. Fortunately I do not see any signs of active infection at this time. 11-10-2021 upon evaluation today patient's wound is actually showing signs of excellent  improvement. Fortunately I do not see any evidence of infection locally or systemically which is great news and overall I am extremely pleased with where we stand today. I do believe he is making good progress he does have his [facility] topical antibiotics with him here today. 11-24-2021 upon evaluation today patient actually showing signs of excellent improvement this appears to be doing much better. Fortunately I do not see any evidence of infection locally or systemically at this time. 12-01-2021 upon evaluation today patient appears to be doing well currently in regard to his wound. He has been tolerating the dressing changes without complication. Fortunately I do not see any evidence of active infection at this time which is great news and overall I am extremely pleased with where we stand today. JANTZEN, PILGER ([id number]) [id number] Page 8 of 10 Objective Constitutional Well-nourished and well-hydrated in no acute distress. Vitals Time Taken: 3:11 PM, Height: 71 in, Weight: 350 lbs, BMI: 48.8, Temperature: 98.6 F, Pulse: 100 bpm, Respiratory Rate: 20 breaths/min, Blood Pressure: 159/100 mmHg. Respiratory normal breathing without difficulty. Psychiatric this patient is able to make decisions and demonstrates good insight into disease process. Alert and Oriented x 3. pleasant and cooperative. General Notes: Patient's wound bed did require sharp debridement I am going to perform that today and the patient tolerated that without complication. Postdebridement the wound bed appears to be doing significantly better which is great news and overall I am extremely pleased with where we stand currently. Integumentary (Hair, Skin) Wound #7 status is Open. Original cause of wound was Blister. The date acquired was: 07/21/2021. The wound has been in treatment 17 weeks. The wound is located on the Right,Medial Malleolus. The wound measures 4.2cm length x 1.2cm width x 0.2cm  depth; 3.958cm^2 area and 0.792cm^3 volume. There is Fat Layer (Subcutaneous Tissue) exposed. There is no tunneling or undermining noted. There is a medium amount of serosanguineous drainage noted. The wound margin is distinct with the outline attached to the wound base. There is large (67-100%) red, pink granulation within the wound bed. There is a small (1-33%) amount of necrotic tissue within the wound bed including Adherent Slough. The periwound skin appearance exhibited: Dry/Scaly, Maceration. The periwound skin appearance did not exhibit: Callus, Crepitus, Excoriation, Induration, Rash, Scarring, Atrophie Blanche, Cyanosis, Ecchymosis, Hemosiderin Staining, Mottled, Pallor, Rubor, Erythema. Assessment Active Problems ICD-10 Lymphedema, not elsewhere classified Chronic venous hypertension (idiopathic) with ulcer and inflammation of bilateral lower extremity Non-pressure chronic ulcer of other part of left lower leg with other specified severity Non-pressure chronic ulcer of other part of right lower leg with other specified severity Type 2 diabetes mellitus with foot ulcer Non-pressure chronic ulcer of other part of right foot with other specified severity Procedures  Wound #7 Pre-procedure diagnosis of Wound #7 is a Venous Leg Ulcer located on the Right,Medial Malleolus .Severity of Tissue Pre Debridement is: Fat layer exposed. There was a Excisional Skin/Subcutaneous Tissue Debridement with a total area of 5.04 sq cm performed by [name], PA. With the following instrument(s): Curette to remove Viable and Non-Viable tissue/material. Material removed includes Subcutaneous Tissue and Slough and after achieving pain control using Lidocaine. No specimens were taken. A time out was conducted at 15:34, prior to the start of the procedure. A Minimum amount of bleeding was controlled with Pressure. The procedure was tolerated well with a pain level of 0 throughout and a pain level of 0  following the procedure. Post Debridement Measurements: 4.2cm length x 1.2cm width x 0.2cm depth; 0.792cm^3 volume. Character of Wound/Ulcer Post Debridement is improved. Severity of Tissue Post Debridement is: Fat layer exposed. Post procedure Diagnosis Wound #7: Same as Pre-Procedure Pre-procedure diagnosis of Wound #7 is a Venous Leg Ulcer located on the Right,Medial Malleolus . There was a Three Layer Compression Therapy Procedure by Rhae Hammock, RN. Post procedure Diagnosis Wound #7: Same as Pre-Procedure Plan Follow-up Appointments: Return Appointment in 1 week. Margarita Grizzle Wednsday Return Appointment in 2 weeks. Margarita Grizzle on Wednesday Other: - [facility] topical compounding antibiotics- bring to your appointments. ***only apply to medial portion of wound that's open*** bring in compression stockings to weekly appts. Anesthetic: (In clinic) Topical Lidocaine 5% applied to wound bed KING, PINZON ([id number]) [id number] Page 9 of 10 Bathing/ Shower/ Hygiene: May shower with protection but do not get wound dressing(s) wet. Edema Control - Lymphedema / SCD / Other: Elevate legs to the level of the heart or above for 30 minutes daily and/or when sitting, a frequency of: - 3-4 times a day throughout the day. Avoid standing for long periods of time. Exercise regularly Moisturize legs daily. - apply every night before bed to left leg. Compression stocking or Garment 30-40 mm/Hg pressure to: - wear your VIVE compression stocking to left leg. apply in the morning and remove at night. Will apply tubigrip size D to left leg. Remove once you get home and use your compression stockings. WOUND #7: - Malleolus Wound Laterality: Right, Medial Peri-Wound Care: Triamcinolone 15 (g) 1 x Per Week/30 Days Discharge Instructions: Use triamcinolone 15 (g) as directed Peri-Wound Care: Sween Lotion (Moisturizing lotion) 1 x Per Week/30 Days Discharge Instructions: Apply  moisturizing lotion as directed Topical: [facility] topical compounding antibiotics 1 x Per Week/30 Days Discharge Instructions: ****APPLY LIGHTLY**** to medial portion of wound. Prim Dressing: Promogran Prisma Matrix, 4.34 (sq in) (silver collagen) 1 x Per Week/30 Days ary Discharge Instructions: Moisten collagen with saline or hydrogel Secondary Dressing: ABD Pad, 8x10 1 x Per Week/30 Days Discharge Instructions: Apply over primary dressing as directed. Com pression Wrap: ThreePress (3 layer compression wrap) 1 x Per Week/30 Days Discharge Instructions: Apply three layer use kerlix instead of cotton compression as directed. 1. I am going to suggest that we have the patient continue with the [facility] topical antibiotics to just the open portion of the wound this seems to be doing quite well. 2. I am also going to suggest we should continue to monitor for any signs of infection or worsening obviously if anything changes he is to let me know. 3. I am also can recommend that he continue with the 3 layer compression wrap which seems to be doing quite well. We will see patient back for reevaluation in 1 week here  in the clinic. If anything worsens or changes patient will contact our office for additional recommendations. Electronic Signature(s) Signed: 12/01/2021 3:42:22 PM By: [name] PA-C Entered By: [name] on 12/01/2021 15:42:22 -------------------------------------------------------------------------------- SuperBill Details Patient Name: Date of Service: Mcenery, [name] 12/01/2021 Medical Record Number: [id number] Patient Account Number: [id number] Date of Birth/Sex: Treating RN: 01/27/1955 (66 y.o. [name], [name] Primary Care Provider: [name] Other Clinician: Referring Provider: Treating Provider/Extender: [name] in Treatment: 17 Diagnosis Coding ICD-10 Codes Code Description I89.0 Lymphedema, not elsewhere classified I87.333  Chronic venous hypertension (idiopathic) with ulcer and inflammation of bilateral lower extremity L97.828 Non-pressure chronic ulcer of other part of left lower leg with other specified severity L97.818 Non-pressure chronic ulcer of other part of right lower leg with other specified severity E11.621 Type 2 diabetes mellitus with foot ulcer L97.518 Non-pressure chronic ulcer of other part of right foot with other specified severity Facility Procedures : CPT4 Code: [id number] Description: 70786 - DEB SUBQ TISSUE 20 SQ CM/< ICD-10 Diagnosis Description L97.828 Non-pressure chronic ulcer of other part of left lower leg with other specified Modifier: severity Quantity: 1 Physician Procedures : CPT4 Code Description Modifier [id number] 11042 - WC PHYS SUBQ TISS 20 SQ CM Mayers, Masaichi D ([id number]) [id number] Page ICD-10 Diagnosis Description [id number] Non-pressure chronic ulcer of other part of left lower leg with other  specified severity Quantity: 1 10 of 10 Electronic Signature(s) Signed: 12/01/2021 3:42:29 PM By: [name] PA-C Entered By: [name] on 12/01/2021 15:42:29

## 2021-12-02 NOTE — Progress Notes (Signed)
ASHDEN, SONNENBERG ([id number]) [id number] Page 1 of 6 Visit Report for 12/01/2021 Arrival Information Details Patient Name: Date of Service: [name] [name] 12/01/2021 2:45 PM Medical Record Number: [id number] Patient Account Number: [id number] Date of Birth/Sex: Treating RN: 07/30/1955 (66 y.o. M) Primary Care Khamila Bassinger: [name] Other Clinician: Referring Fritz Cauthon: Treating Kyriana Yankee/Extender: [name] in Treatment: 96 Visit Information History Since Last Visit Added or deleted any medications: No Patient Arrived: [name] Any new allergies or adverse reactions: No Arrival Time: 15:10 Had a fall or experienced change in No Accompanied By: self activities of daily living that may affect Transfer Assistance: None risk of falls: Patient Identification Verified: Yes Signs or symptoms of abuse/neglect since last visito No Secondary Verification Process Completed: Yes Hospitalized since last visit: No Patient Requires Transmission-Based Precautions: No Implantable device outside of the clinic excluding No Patient Has Alerts: Yes cellular tissue based products placed in the center Patient Alerts: Patient on Blood Thinner since last visit: 01/2021 ABI L 1.13 R 1.06 Has Compression in Place as Prescribed: Yes 01/2021 TBI L amp R 0.8 Pain Present Now: No Electronic Signature(s) Signed: 12/01/2021 4:51:51 PM By: Erenest Blank Entered By: Erenest Blank on 12/01/2021 15:11:04 -------------------------------------------------------------------------------- Compression Therapy Details Patient Name: Date of Service: [name]. 12/01/2021 2:45 PM Medical Record Number: [id number] Patient Account Number: [id number] Date of Birth/Sex: Treating RN: 11-18-1955 (66 y.o. [name] Noe Primary Care Jillene Wehrenberg: [name] Other Clinician: Referring Daved Mcfann: Treating Henchy Mccauley/Extender: [name] in  Treatment: 17 Compression Therapy Performed for Wound Assessment: Wound #7 Right,Medial Malleolus Performed By: Clinician Rhae Hammock, RN Compression Type: Three Layer Post Procedure Diagnosis Same as Pre-procedure Electronic Signature(s) Signed: 12/01/2021 3:58:15 PM By: Rhae Hammock RN Entered By: Rhae Hammock on 12/01/2021 15:37:38 [place], Arnol [name] ([id number]) [id number] Page 2 of 6 -------------------------------------------------------------------------------- Encounter Discharge Information Details Patient Name: Date of Service: [name] 12/01/2021 2:45 PM Medical Record Number: [id number] Patient Account Number: [id number] Date of Birth/Sex: Treating RN: [birth year] (66 y.o. [name], [name] Primary Care Ryan Ogborn: [name] Other Clinician: Referring Elene Downum: Treating Adonte Vanriper/Extender: [name] in Treatment: 17 Encounter Discharge Information Items Post Procedure Vitals Discharge Condition: Stable Temperature (F): 98.7 Ambulatory Status: Walker Pulse (bpm): 74 Discharge Destination: Home Respiratory Rate (breaths/min): 17 Transportation: Private Auto Blood Pressure (mmHg): 120/80 Accompanied By: self Schedule Follow-up Appointment: Yes Clinical Summary of Care: Patient Declined Electronic Signature(s) Signed: 12/01/2021 3:58:15 PM By: Rhae Hammock RN Entered By: Rhae Hammock on 12/01/2021 15:38:27 -------------------------------------------------------------------------------- Lower Extremity Assessment Details Patient Name: Date of Service: [name]. 12/01/2021 2:45 PM Medical Record Number: [id number] Patient Account Number: [id number] Date of Birth/Sex: Treating RN: June 30, 1955 (66 y.o. M) Primary Care Simya Tercero: [name] Other Clinician: Referring Treveon Bourcier: Treating Maresha Anastos/Extender: [name] in Treatment: 17 Edema  Assessment Assessed: [name]: No] [name]: No] Edema: [Left: Yes] [Right: No] Calf Left: Right: Point of Measurement: 31 cm From Medial Instep 38 cm Ankle Left: Right: Point of Measurement: 11 cm From Medial Instep 26 cm Electronic Signature(s) Signed: 12/01/2021 4:51:51 PM By: Erenest Blank Entered By: Erenest Blank on 12/01/2021 15:12:30 -------------------------------------------------------------------------------- Multi-Disciplinary Care Plan Details Patient Name: Date of Service: [name]. 12/01/2021 2:45 PM Medical Record Number: [id number] Patient Account Number: [id number] Date of Birth/Sex: Treating RN: 05-15-1955 (66 y.o. M) Primary Care Avril Busser: [name] Other Clinician: Referring Ahnaf Caponi: Treating Alaiyah Bollman/Extender: [name] in Treatment: [phone]  [name] [name] ([id number]) [id number] Page 3 of 6 Multidisciplinary Care Plan reviewed with physician Active Inactive Pain, Acute or Chronic Nursing Diagnoses: Pain, acute or chronic: actual or potential Potential alteration in comfort, pain Goals: Patient will verbalize adequate pain control and receive pain control interventions during procedures as needed Date Initiated: 08/04/2021 Target Resolution Date: 12/03/2021 Goal Status: Active Patient/caregiver will verbalize comfort level met Date Initiated: 08/04/2021 Target Resolution Date: 12/03/2021 Goal Status: Active Interventions: Encourage patient to take pain medications as prescribed Provide education on pain management Reposition patient for comfort Treatment Activities: Administer pain control measures as ordered : 08/04/2021 Notes: Electronic Signature(s) Signed: 12/01/2021 4:51:51 PM By: Dick, Kimberly Entered By: Dick, Kimberly on 12/01/2021 15:33:34 -------------------------------------------------------------------------------- Pain Assessment Details Patient Name: Date of Service: [name]. 12/01/2021 2:45 PM Medical Record Number: [id number] Patient Account Number: [id number] Date of Birth/Sex: Treating RN: 10/18/1955 (66 y.o. M) Primary Care Provider: [name], William Other Clinician: Referring Provider: Treating Provider/Extender: Stone III, Hoyt [name], William Weeks in Treatment: 17 Active Problems Location of Pain Severity and Description of Pain Patient Has Paino No Site Locations Pain Management and Medication [name] [name] ([id number]) [id number] Page 4 of 6 Current Pain Management: Electronic Signature(s) Signed: 12/01/2021 4:51:51 PM By: Dick, Kimberly Entered By: Dick, Kimberly on 12/01/2021 15:12:07 -------------------------------------------------------------------------------- Patient/Caregiver Education Details Patient Name: Date of Service: [name] [name] 11/22/2023andnbsp2:45 PM Medical Record Number: [id number] Patient Account Number: [id number] Date of Birth/Gender: Treating RN: 12/06/1955 (66 y.o. M) Breedlove, [name] Primary Care Physician: [name], William Other Clinician: Referring Physician: Treating Physician/Extender: Stone III, Hoyt [name], William Weeks in Treatment: 17 Education Assessment Education Provided To: Patient Education Topics Provided Pain: Methods: Explain/Verbal Responses: Reinforcements needed, State content correctly Electronic Signature(s) Signed: 12/01/2021 3:58:15 PM By: Breedlove, [name] RN Entered By: Breedlove, [name] on 12/01/2021 15:36:52 -------------------------------------------------------------------------------- Wound Assessment Details Patient Name: Date of Service: [name] [name] 12/01/2021 2:45 PM Medical Record Number: [id number] Patient Account Number: [id number] Date of Birth/Sex: Treating RN: 02/15/1955 (66 y.o. M) Primary Care Provider: [name], William Other Clinician: Referring Provider: Treating Provider/Extender: Stone III, Hoyt [name], William Weeks in Treatment:  17 Wound Status Wound Number: 7 Primary Venous Leg Ulcer Etiology: Wound Location: Right, Medial Malleolus Secondary Lymphedema Wounding Event: Blister Etiology: Date Acquired: 07/21/2021 Wound Open Weeks Of Treatment: 17 Status: Clustered Wound: No Comorbid Sleep Apnea, Arrhythmia, Congestive Heart Failure, History: Hypertension, Hypotension, Peripheral Arterial Disease, Peripheral Venous Disease, Type II Diabetes, Gout, Osteoarthritis Photos [name] [name] ([id number]) [id number] Page 5 of 6 Wound Measurements Length: (cm) Width: (cm) Depth: (cm) Area: (cm) Volume: (cm) 4.2 % Reduction in Area: 89.7% 1.2 % Reduction in Volume: 97% 0.2 Epithelialization: Medium (34-66%) 3.958 Tunneling: No 0.792 Undermining: No Wound Description Classification: Full Thickness With Exposed Suppo Wound Margin: Distinct, outline attached Exudate Amount: Medium Exudate Type: Serosanguineous Exudate Color: red, brown rt Structures Foul Odor After Cleansing: No Slough/Fibrino Yes Wound Bed Granulation Amount: Large (67-100%) Exposed Structure Granulation Quality: Red, Pink Fascia Exposed: No Necrotic Amount: Small (1-33%) Fat Layer (Subcutaneous Tissue) Exposed: Yes Necrotic Quality: Adherent Slough Tendon Exposed: No Muscle Exposed: No Joint Exposed: No Bone Exposed: No Periwound Skin Texture Texture Color No Abnormalities Noted: No No Abnormalities Noted: No Callus: No Atrophie Blanche: No Crepitus: No Cyanosis: No Excoriation: No Ecchymosis: No Induration: No Erythema: No Rash: No Hemosiderin Staining: No Scarring: No Mottled: No Pallor: No Moisture Rubor: No No Abnormalities Noted: No Dry / Scaly: Yes Maceration: Yes Treatment Notes Wound #7 (Malleolus) Wound Laterality:   Right, Medial Cleanser Peri-Wound Care Triamcinolone 15 (g) Discharge Instruction: Use triamcinolone 15 (g) as directed Sween Lotion (Moisturizing lotion) Discharge  Instruction: Apply moisturizing lotion as directed Topical Keystone topical compounding antibiotics Discharge Instruction: ****APPLY LIGHTLY**** to medial portion of wound. Primary Dressing Promogran Prisma Matrix, 4.34 (sq in) (silver collagen) Discharge Instruction: Moisten collagen with saline or hydrogel Secondary Dressing ABD Pad, 8x10 Discharge Instruction: Apply over primary dressing as directed. [name] [name] ([id number]) [id number] Page 6 of 6 Secured With Compression Wrap ThreePress (3 layer compression wrap) Discharge Instruction: Apply three layer use kerlix instead of cotton compression as directed. Compression Stockings Add-Ons Electronic Signature(s) Signed: 12/01/2021 4:43:23 PM By: Deaton, Bobbi RN, BSN Entered By: Deaton, Bobbi on 12/01/2021 15:13:26 -------------------------------------------------------------------------------- Vitals Details Patient Name: Date of Service: [name] [name] 12/01/2021 2:45 PM Medical Record Number: [id number] Patient Account Number: [id number] Date of Birth/Sex: Treating RN: 06/03/1955 (66 y.o. M) Primary Care Provider: [name], William Other Clinician: Referring Provider: Treating Provider/Extender: Stone III, Hoyt [name], William Weeks in Treatment: 17 Vital Signs Time Taken: 15:11 Temperature (°F): 98.6 Height (in): 71 Pulse (bpm): 100 Weight (lbs): 350 Respiratory Rate (breaths/min): 20 Body Mass Index (BMI): 48.8 Blood Pressure (mmHg): 159/100 Reference Range: 80 - 120 mg / dl Electronic Signature(s) Signed: 12/01/2021 4:51:51 PM By: Dick, Kimberly Entered By: Dick, Kimberly on 12/01/2021 15:11:51 

## 2021-12-06 ENCOUNTER — Ambulatory Visit (INDEPENDENT_AMBULATORY_CARE_PROVIDER_SITE_OTHER): Payer: Medicare Other | Admitting: Podiatry

## 2021-12-06 DIAGNOSIS — I83013 Varicose veins of right lower extremity with ulcer of ankle: Secondary | ICD-10-CM

## 2021-12-06 DIAGNOSIS — I872 Venous insufficiency (chronic) (peripheral): Secondary | ICD-10-CM

## 2021-12-06 DIAGNOSIS — H5213 Myopia, bilateral: Secondary | ICD-10-CM | POA: Diagnosis not present

## 2021-12-06 DIAGNOSIS — E08621 Diabetes mellitus due to underlying condition with foot ulcer: Secondary | ICD-10-CM

## 2021-12-06 DIAGNOSIS — L97511 Non-pressure chronic ulcer of other part of right foot limited to breakdown of skin: Secondary | ICD-10-CM

## 2021-12-06 DIAGNOSIS — L97522 Non-pressure chronic ulcer of other part of left foot with fat layer exposed: Secondary | ICD-10-CM

## 2021-12-06 DIAGNOSIS — L97311 Non-pressure chronic ulcer of right ankle limited to breakdown of skin: Secondary | ICD-10-CM

## 2021-12-07 DIAGNOSIS — J449 Chronic obstructive pulmonary disease, unspecified: Secondary | ICD-10-CM | POA: Diagnosis not present

## 2021-12-08 ENCOUNTER — Encounter (HOSPITAL_BASED_OUTPATIENT_CLINIC_OR_DEPARTMENT_OTHER): Payer: Medicare Other | Admitting: General Surgery

## 2021-12-08 DIAGNOSIS — L97518 Non-pressure chronic ulcer of other part of right foot with other specified severity: Secondary | ICD-10-CM | POA: Diagnosis not present

## 2021-12-08 DIAGNOSIS — I87333 Chronic venous hypertension (idiopathic) with ulcer and inflammation of bilateral lower extremity: Secondary | ICD-10-CM | POA: Diagnosis not present

## 2021-12-08 DIAGNOSIS — E11621 Type 2 diabetes mellitus with foot ulcer: Secondary | ICD-10-CM | POA: Diagnosis not present

## 2021-12-08 DIAGNOSIS — I890 Lymphedema, not elsewhere classified: Secondary | ICD-10-CM | POA: Diagnosis not present

## 2021-12-08 DIAGNOSIS — L97828 Non-pressure chronic ulcer of other part of left lower leg with other specified severity: Secondary | ICD-10-CM | POA: Diagnosis not present

## 2021-12-08 DIAGNOSIS — L97818 Non-pressure chronic ulcer of other part of right lower leg with other specified severity: Secondary | ICD-10-CM | POA: Diagnosis not present

## 2021-12-08 NOTE — Progress Notes (Signed)
[name], [name] ([id number]) [id number] Page 1 of 4 Visit Report for 12/08/2021 Arrival Information Details Patient Name: Date of Service: [name], [name] 12/08/2021 2:45 PM Medical Record Number: [id number] Patient Account Number: [id number] Date of Birth/Sex: Treating RN: 10-25-1955 (66 y.o. M) Primary Care [name]: [name] Other Clinician: Referring [name]: Treating [name]/Extender: [name] in Treatment: 18 Visit Information History Since Last Visit Added or deleted any medications: No Patient Arrived: [name] Any new allergies or adverse reactions: No Arrival Time: 15:00 Had a fall or experienced change in No Accompanied By: self activities of daily living that may affect Transfer Assistance: None risk of falls: Patient Identification Verified: Yes Signs or symptoms of abuse/neglect since last visito No Secondary Verification Process Completed: Yes Hospitalized since last visit: No Patient Requires Transmission-Based Precautions: No Implantable device outside of the clinic excluding No Patient Has Alerts: Yes cellular tissue based products placed in the center Patient Alerts: Patient on Blood Thinner since last visit: 01/2021 ABI L 1.13 R 1.06 Has Compression in Place as Prescribed: Yes 01/2021 TBI L amp R 0.8 Pain Present Now: Yes Electronic Signature(s) Signed: 12/08/2021 4:47:29 PM By: Erenest Blank Entered By: Erenest Blank on 12/08/2021 15:02:39 -------------------------------------------------------------------------------- Compression Therapy Details Patient Name: Date of Service: [name] Gloss [name]. 12/08/2021 2:45 PM Medical Record Number: [id number] Patient Account Number: [id number] Date of Birth/Sex: Treating RN: [birth year] (66 y.o. M) Primary Care [name]: [name] Other Clinician: Referring [name]: Treating Kameela Leipold/Extender: [name] in Treatment:  18 Compression Therapy Performed for Wound Assessment: Wound #7 Right,Medial Malleolus Performed By: Clinician Erenest Blank, Compression Type: Three Layer Electronic Signature(s) Signed: 12/08/2021 4:47:29 PM By: Erenest Blank Entered By: Erenest Blank on 12/08/2021 16:44:29 -------------------------------------------------------------------------------- Encounter Discharge Information Details Patient Name: Date of Service: [name], [name] [name]. 12/08/2021 2:45 PM Medical Record Number: [id number] Patient Account Number: [id number] Date of Birth/Sex: Treating RN: July 12, 1955 (66 y.o. [name] ([id number]) [id number] Page 2 of 4 Primary Care Judson Tsan: [name] Other Clinician: Erenest Blank Referring Jordann Grime: Treating [name]/Extender: [name] in Treatment: 18 Encounter Discharge Information Items Discharge Condition: Stable Ambulatory Status: Walker Discharge Destination: Home Transportation: Private Auto Accompanied By: self Schedule Follow-up Appointment: Yes Clinical Summary of Care: Electronic Signature(s) Signed: 12/08/2021 4:47:29 PM By: Erenest Blank Entered By: Erenest Blank on 12/08/2021 16:45:55 -------------------------------------------------------------------------------- Patient/Caregiver Education Details Patient Name: Date of Service: [name] Gloss [name]. 11/29/2023andnbsp2:45 PM Medical Record Number: [id number] Patient Account Number: [id number] Date of Birth/Gender: Treating RN: 1955/12/27 (66 y.o. M) Primary Care Physician: [name] Other Clinician: Erenest Blank Referring Physician: Treating Physician/Extender: [name] in Treatment: 18 Education Assessment Education Provided To: Patient Education Topics Provided Electronic Signature(s) Signed: 12/08/2021 4:47:29 PM By: Erenest Blank Entered By: Erenest Blank on 12/08/2021  16:45:35 -------------------------------------------------------------------------------- Wound Assessment Details Patient Name: Date of Service: [name] Gloss [name]. 12/08/2021 2:45 PM Medical Record Number: [id number] Patient Account Number: [id number] Date of Birth/Sex: Treating RN: [birth year] (66 y.o. M) Primary Care [name]: [name] Other Clinician: Referring [name]: Treating [name]/Extender: [name] in Treatment: 18 Wound Status Wound Number: 7 Primary Etiology: Venous Leg Ulcer Wound Location: Right, Medial Malleolus Secondary Etiology: Lymphedema Wounding Event: Blister Wound Status: Open Date Acquired: 07/21/2021 Weeks Of Treatment: 18 Clustered Wound: No Wound Measurements [name] ([id number]) Length: (cm) 4.2 Width: (cm) 1.2 Depth: (cm) 0.2 Area: (cm) 3.958 Volume: (cm) 0.792 [id number] Page 3 of 4 % Reduction in Area: 89.7% %  Reduction in Volume: 97% Wound Description Classification: Full Thickness With Exposed Support Exudate Amount: Medium Exudate Type: Serosanguineous Exudate Color: red, brown Structures Periwound Skin Texture Texture Color No Abnormalities Noted: No No Abnormalities Noted: No Moisture No Abnormalities Noted: No Treatment Notes Wound #7 (Malleolus) Wound Laterality: Right, Medial Cleanser Peri-Wound Care Triamcinolone 15 (g) Discharge Instruction: Use triamcinolone 15 (g) as directed Sween Lotion (Moisturizing lotion) Discharge Instruction: Apply moisturizing lotion as directed Topical Keystone topical compounding antibiotics Discharge Instruction: ****APPLY LIGHTLY**** to medial portion of wound. Primary Dressing Promogran Prisma Matrix, 4.34 (sq in) (silver collagen) Discharge Instruction: Moisten collagen with saline or hydrogel Secondary Dressing ABD Pad, 8x10 Discharge Instruction: Apply over primary dressing as directed. Secured With Compression  Wrap ThreePress (3 layer compression wrap) Discharge Instruction: Apply three layer use kerlix instead of cotton compression as directed. Compression Stockings Add-Ons Electronic Signature(s) Signed: 12/08/2021 4:47:29 PM By: Erenest Blank Entered By: Erenest Blank on 12/08/2021 15:03:06 -------------------------------------------------------------------------------- Vitals Details Patient Name: Date of Service: [name], [name] [name]. 12/08/2021 2:45 PM Medical Record Number: [id number] Patient Account Number: [id number] Date of Birth/Sex: Treating RN: 1955/04/12 (66 y.o. M) Primary Care Macauley Mossberg: [name] Other Clinician: Referring Enzio Buchler: Treating Shalondra Wunschel/Extender: [name] in Treatment: [address] [name], [name] Fuller ([id number]) [id number] Page 4 of 4 Time Taken: 15:02 Reference Range: 80 - 120 mg / dl Height (in): 71 Weight (lbs): 350 Body Mass Index (BMI): 48.8 Electronic Signature(s) Signed: 12/08/2021 4:47:29 PM By: Erenest Blank Entered By: Erenest Blank on 12/08/2021 15:02:52

## 2021-12-09 ENCOUNTER — Encounter: Payer: Medicare Other | Admitting: Internal Medicine

## 2021-12-09 NOTE — Progress Notes (Signed)
[name], [name] ([id number]) [id number] Page 1 of 1 Visit Report for 12/08/2021 SuperBill Details Patient Name: Date of Service: [name], [name] 12/08/2021 Medical Record Number: [id number] Patient Account Number: [id number] Date of Birth/Sex: Treating RN: November 05, 1955 (66 y.o. M) Primary Care Provider: [name] Other Clinician: Referring Provider: Treating Provider/Extender: Bethena Roys in Treatment: 18 Diagnosis Coding ICD-10 Codes Code Description I89.0 Lymphedema, not elsewhere classified I87.333 Chronic venous hypertension (idiopathic) with ulcer and inflammation of bilateral lower extremity L97.828 Non-pressure chronic ulcer of other part of left lower leg with other specified severity L97.818 Non-pressure chronic ulcer of other part of right lower leg with other specified severity E11.621 Type 2 diabetes mellitus with foot ulcer L97.518 Non-pressure chronic ulcer of other part of right foot with other specified severity Facility Procedures CPT4 Code Description Modifier Quantity [id number] (Facility Use Only) [phone] - APPLY MULTLAY COMPRS LWR RT LEG 1 Electronic Signature(s) Signed: 12/08/2021 4:47:29 PM By: Erenest Blank Signed: 12/09/2021 8:16:37 AM By: Fredirick Maudlin MD FACS Entered By: Erenest Blank on 12/08/2021 16:46:54

## 2021-12-14 ENCOUNTER — Ambulatory Visit (INDEPENDENT_AMBULATORY_CARE_PROVIDER_SITE_OTHER): Payer: Medicare Other

## 2021-12-14 DIAGNOSIS — I442 Atrioventricular block, complete: Secondary | ICD-10-CM

## 2021-12-15 ENCOUNTER — Encounter (HOSPITAL_BASED_OUTPATIENT_CLINIC_OR_DEPARTMENT_OTHER): Payer: Medicare Other | Attending: Physician Assistant | Admitting: Physician Assistant

## 2021-12-15 DIAGNOSIS — I890 Lymphedema, not elsewhere classified: Secondary | ICD-10-CM | POA: Insufficient documentation

## 2021-12-15 DIAGNOSIS — L97818 Non-pressure chronic ulcer of other part of right lower leg with other specified severity: Secondary | ICD-10-CM | POA: Insufficient documentation

## 2021-12-15 DIAGNOSIS — I110 Hypertensive heart disease with heart failure: Secondary | ICD-10-CM | POA: Insufficient documentation

## 2021-12-15 DIAGNOSIS — L97312 Non-pressure chronic ulcer of right ankle with fat layer exposed: Secondary | ICD-10-CM | POA: Diagnosis not present

## 2021-12-15 DIAGNOSIS — Z7901 Long term (current) use of anticoagulants: Secondary | ICD-10-CM | POA: Insufficient documentation

## 2021-12-15 DIAGNOSIS — I872 Venous insufficiency (chronic) (peripheral): Secondary | ICD-10-CM | POA: Diagnosis not present

## 2021-12-15 DIAGNOSIS — L97828 Non-pressure chronic ulcer of other part of left lower leg with other specified severity: Secondary | ICD-10-CM | POA: Insufficient documentation

## 2021-12-15 DIAGNOSIS — L97518 Non-pressure chronic ulcer of other part of right foot with other specified severity: Secondary | ICD-10-CM | POA: Diagnosis not present

## 2021-12-15 DIAGNOSIS — M109 Gout, unspecified: Secondary | ICD-10-CM | POA: Diagnosis not present

## 2021-12-15 DIAGNOSIS — I87333 Chronic venous hypertension (idiopathic) with ulcer and inflammation of bilateral lower extremity: Secondary | ICD-10-CM | POA: Diagnosis not present

## 2021-12-15 DIAGNOSIS — E11621 Type 2 diabetes mellitus with foot ulcer: Secondary | ICD-10-CM | POA: Diagnosis not present

## 2021-12-15 DIAGNOSIS — I509 Heart failure, unspecified: Secondary | ICD-10-CM | POA: Diagnosis not present

## 2021-12-15 DIAGNOSIS — M1990 Unspecified osteoarthritis, unspecified site: Secondary | ICD-10-CM | POA: Diagnosis not present

## 2021-12-15 NOTE — Progress Notes (Addendum)
LEEVON, UPPERMAN ([id number]) [id number] Page 1 of 10 Visit Report for 12/15/2021 Chief Complaint Document Details Patient Name: Date of Service: [name], [name] 12/15/2021 2:30 PM Medical Record Number: [id number] Patient Account Number: [id number] Date of Birth/Sex: Treating RN: [birth year] (66 y.o. M) Primary Care Provider: [name] Other Clinician: Referring Provider: Treating Provider/Extender: [name] in Treatment: 19 Information Obtained from: Patient Chief Complaint 08/04/2021; patient returns to clinic with bilateral leg wounds as well as areas on the right foot Electronic Signature(s) Signed: 12/15/2021 2:33:33 PM By: [name] PA-C Entered By: [name] on 12/15/2021 14:33:32 -------------------------------------------------------------------------------- Debridement Details Patient Name: Date of Service: [name], [name] 12/15/2021 2:30 PM Medical Record Number: [id number] Patient Account Number: [id number] Date of Birth/Sex: Treating RN: 09/28/1955 (66 y.o. [name], [name] Primary Care Provider: [name] Other Clinician: Referring Provider: Treating Provider/Extender: [name] in Treatment: 19 Debridement Performed for Assessment: Wound #7 Right,Medial Malleolus Performed By: Physician [name], PA Debridement Type: Debridement Severity of Tissue Pre Debridement: Fat layer exposed Level of Consciousness (Pre-procedure): Awake and Alert Pre-procedure Verification/Time Out Yes - 15:32 Taken: Start Time: 15:32 Pain Control: Lidocaine T Area Debrided (L x W): otal 4 (cm) x 1.4 (cm) = 5.6 (cm) Tissue and other material debrided: Viable, Non-Viable, Slough, Subcutaneous, Slough Level: Skin/Subcutaneous Tissue Debridement Description: Excisional Instrument: Curette Bleeding: Minimum Hemostasis Achieved: Pressure End Time: 15:32 Procedural Pain: 0 Post  Procedural Pain: 0 Response to Treatment: Procedure was tolerated well Level of Consciousness (Post- Awake and Alert procedure): Post Debridement Measurements of Total Wound Length: (cm) 4 Width: (cm) 1.4 Depth: (cm) 0.2 Volume: (cm) 0.88 Character of Wound/Ulcer Post Debridement: Improved Severity of Tissue Post Debridement: Fat layer exposed [name], [name] ([id number]) [id number] Page 2 of 10 Post Procedure Diagnosis Same as Pre-procedure Electronic Signature(s) Signed: 12/15/2021 4:11:00 PM By: [name] PA-C Signed: 01/12/2022 5:35:40 PM By: Rhae Hammock RN Entered By: Rhae Hammock on 12/15/2021 15:33:15 -------------------------------------------------------------------------------- HPI Details Patient Name: Date of Service: [name], [name] 12/15/2021 2:30 PM Medical Record Number: [id number] Patient Account Number: [id number] Date of Birth/Sex: Treating RN: 09-Jun-1955 (66 y.o. M) Primary Care Provider: [name] Other Clinician: Referring Provider: Treating Provider/Extender: [name] in Treatment: 19 History of Present Illness HPI Description: ADMISSION 03/22/2021 This is a 66 year old man with a past medical history significant for diabetes type 2, congestive heart failure, peripheral arterial disease, morbid obesity, venous insufficiency, and coronary artery disease. He has been followed by [name] in podiatry, who performed a transmetatarsal amputation on the left foot in August 2022. He had issues healing that wound, but based upon Dr. [name] notes, ultimately the TMA wound healed. During his recovery from that surgery, however, ulcers opened up over the DIP joint of the right second and third toe. These have apparently closed and reopened multiple times. It sounds like one of the issues has been moisture accumulation and maceration of the tissues causing them to reopen. At his last visit with Dr.  Earleen Newport, on March 01, 2021, there continues to be problems with moisture and he was referred to wound care for further evaluation and management. He had a formal aortogram with runoff performed prior to his TMA. The findings are copied here: Patient has inline flow to both feet with no significant flow-limiting lesion that would be amenable to percutaneous or open revascularization. He does have an element of small vessel disease and  has a short segment occlusion of the distal anterior tibial/dorsalis pedis artery on the left foot but does have posterior tibial artery flow. Would recommend management of wounds with amputation of toes 2 and 3 on the right foot if the wounds do not heal and deteriorate. Transmetatarsal amputation on the left side has as good a blood supply as it is going to get and hopefully this will heal in the future. Formal ABIs were done in January 2023. They are normal bilaterally. ABI Findings: +---------+------------------+-----+----------+--------+ Right Rt Pressure (mmHg)IndexWaveform Comment  +---------+------------------+-----+----------+--------+ Brachial 160     +---------+------------------+-----+----------+--------+ PTA 192 1.06 monophasic  +---------+------------------+-----+----------+--------+ DP 159 0.88 monophasic  +---------+------------------+-----+----------+--------+ Great T oe145 0.80    +---------+------------------+-----+----------+--------+ +--------+------------------+-----+---------+-------+ Left Lt Pressure (mmHg)IndexWaveform Comment +--------+------------------+-----+---------+-------+ [id number]     +--------+------------------+-----+---------+-------+ PTA 204 1.13 triphasic  +--------+------------------+-----+---------+-------+ DP 194 1.07 biphasic   +--------+------------------+-----+---------+-------+ +-------+-----------+-----------+------------+------------+ ABI/TBIT oday's  ABIT oday's TBIPrevious ABIPrevious TBI +-------+-----------+-----------+------------+------------+ Right 1.06 0.8 1.26 0.65  +-------+-----------+-----------+------------+------------+ Left 1.13 amputation 1.15 amputation  +-------+-----------+-----------+------------+------------+ Previous ABI on 08/06/20 at [facility] Pedal pressures falsely elevated due to medial calcification. AQIB, LOUGH ([id number]) [id number] Page 3 of 10 Summary: Right: Resting right ankle-brachial index is within normal range. The right toe-brachial index is normal. Left: Resting left ankle-brachial index is within normal range. READMISSION 08/04/2021 Mr. Sardo is now a 66 year old man who I remember from this clinic many years ago I think he had a right lower extremity predominantly venous wound at the time. He was here for 1 visit in March of this year had wounds on his right second and third toes we apparently dressed them many and they healed so he did not come back. He is listed in [place] is being a diabetic although the patient denies this says he is verified it with his primary doctor. In any case over the last several weeks or so according the patient although these wounds look somewhat more chronic than that he has developed predominantly large wounds on the right medial and right lateral ankle smaller areas on the left leg and areas on the dorsal aspect of his right second and third toes. Its not clear how he has been dressing these. 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 The patient has known PAD. He had an angiogram in August 2022 at which time he had nonhealing wounds in both feet. On the left his major vessels in the thigh were all patent. He had three-vessel patent to the level of the ankle. He had a very short occlusion in the left anterior tibial. On the right lower  extremity the major vessels in his thigh were all patent. He had three-vessel runoff to the foot sluggish filling of the anterior tibial artery. He was felt to have some component of small vessel disease but nothing that was amenable or needed revascularization. It was recommended that he have amputation of the second and third toes on the right foot if they did not heal He has been following with [name] of podiatry. [name] got him juxta lite stockings although I do not think he had them on properly he has uncontrolled edema in both legs Past medical history includes type 2 diabetes [although the patient really denies this], left TMA in 2022,lower extremity wounds in fact attendance at this clinic in 2009-2010, A-fib on Eliquis, chronic venous insufficiency with secondary lymphedema history of non-Hodgkin's lymphoma. 08-11-2021 upon evaluation today patient presents for  follow-up evaluation he was seen last Wednesday initially for inspection here in our clinic. With that being said he tells me that he unfortunately has been having a lot of drainage and is actually coming through his wrap. Fortunately I do not see any evidence of active infection locally or systemically at this time which is great news. No fevers, chills, nausea, vomiting, or diarrhea. With that being said there does appear to be some evidence of local infection based on what I am seeing today. 08-18-2021 upon evaluation today patient appears to be doing okay currently in regard to his wounds with that being said that he is doing much better but still has a long ways to go to get to where he wanted to be. I think the infection is significantly improved. He has another week of the antibiotic at this point. 08-25-2021 upon evaluation today patient's wounds are actually doing decently well he has erythema has significantly improved. I think the cellulitis is under controlled I am going to continue him on 2 more weeks of the Levaquin at  500 mg this is a lower dose but I am hoping it will be better for him. 09-08-2021 upon evaluation today patient appears to be doing excellent in regard to his wounds. Since I last saw him he was actually in the hospital where he ended up having a pacemaker put in. Subsequently he tells me that he is actually doing quite well although they were unsure whether they were going to do it due to the fact that he had the wounds on his legs. And then I am glad they did anything seem to be doing well. 09-15-2021 upon evaluation patient's wounds are actually showing signs of improvement. The right side wounds do appear to have some need for sharp debridement today and I Georgina Peer go ahead and proceed with that. I think that if we get the wounds cleaned up he will actually show signs of continued improvement. I am also leaning towards switching to [facility] which I think will be a much better option for him. 09-22-2021 patient's wounds are showing signs of excellent improvement. I am actually extremely pleased with where we stand and I think that the patient is making great progress. There does not appear to be any signs of active infection. 09-29-2021 upon evaluation today patient appears to be doing excellent in regard to his wounds. He is actually tolerating the dressing changes without complication. Fortunately I see no evidence of active infection locally or systemically at this time which is great news and overall I am extremely pleased with where we stand currently. 09-2721 upon evaluation today patient actually appears to be doing excellent in regard to his wounds. The left leg is almost completely healed the right ankle is significantly smaller. Overall I am extremely pleased with where we stand at this point. I do not see any evidence of active infection at this time. 10-13-2021 upon evaluation today patient appears to be doing excellent in regard to his wounds. I really feel like he is making good progress  here and I am very pleased in that regard. Fortunately I do not see any signs of active infection at this time. We are using the [facility] topical antibiotic therapy. 10-20-2021 upon evaluation today patient appears to be doing well with regard to his wound on the right medial ankle region the left leg is almost completely healed. I am actually very pleased with where we stand today. 11-03-2021 upon evaluation today patient's wound actually  is going require some sharp debridement but appears to be doing much better which is great news. Fortunately I do not see any signs of active infection at this time. 11-10-2021 upon evaluation today patient's wound is actually showing signs of excellent improvement. Fortunately I do not see any evidence of infection locally or systemically which is great news and overall I am extremely pleased with where we stand today. I do believe he is making good progress he does have his Keystone topical antibiotics with him here today. 11-24-2021 upon evaluation today patient actually showing signs of excellent improvement this appears to be doing much better. Fortunately I do not see any evidence of infection locally or systemically at this time. 12-01-2021 upon evaluation today patient appears to be doing well currently in regard to his wound. He has been tolerating the dressing changes without complication. Fortunately I do not see any evidence of active infection at this time which is great news and overall I am extremely pleased with where we stand today. 12-15-2021 upon evaluation today patient appears to be doing well with regard to his wound. He is showing signs of improvement is slow but nonetheless we are making improvements here. Fortunately I do not see any evidence of active infection locally nor systemically at this time. We are still using the [facility] topical antibiotic over the open area only. Electronic Signature(s) Signed: 12/15/2021 3:41:12 PM By: [name] PA-C Entered By: [name] on 12/15/2021 15:41:12 [name], [name] ([id number]) [id number] Page 4 of 10 -------------------------------------------------------------------------------- Physical Exam Details Patient Name: Date of Service: [name], [name] 12/15/2021 2:30 PM Medical Record Number: [id number] Patient Account Number: [id number] Date of Birth/Sex: Treating RN: 1955/11/15 (66 y.o. M) Primary Care Provider: [name] Other Clinician: Referring Provider: Treating Provider/Extender: [name] in Treatment: 19 Constitutional Obese and well-hydrated in no acute distress. Respiratory normal breathing without difficulty. Psychiatric this patient is able to make decisions and demonstrates good insight into disease process. Alert and Oriented x 3. pleasant and cooperative. Notes Upon inspection patient's wound bed actually showed signs of the need for sharp debridement I did perform some debridement clearway some of the slough and biofilm on the surface of the wound down to good subcutaneous tissue he tolerated that without complication and postdebridement the wound bed appears to be doing much better which is great news. Electronic Signature(s) Signed: 12/15/2021 3:41:28 PM By: [name] PA-C Entered By: [name] on 12/15/2021 15:41:27 -------------------------------------------------------------------------------- Physician Orders Details Patient Name: Date of Service: [name], [name] 12/15/2021 2:30 PM Medical Record Number: [id number] Patient Account Number: [id number] Date of Birth/Sex: Treating RN: [birth year] (66 y.o. [name], [name] Primary Care Provider: [name] Other Clinician: Referring Provider: Treating Provider/Extender: [name] in Treatment: 21 Verbal / Phone Orders: No Diagnosis Coding ICD-10 Coding Code Description I89.0 Lymphedema, not  elsewhere classified I87.333 Chronic venous hypertension (idiopathic) with ulcer and inflammation of bilateral lower extremity L97.828 Non-pressure chronic ulcer of other part of left lower leg with other specified severity L97.818 Non-pressure chronic ulcer of other part of right lower leg with other specified severity E11.621 Type 2 diabetes mellitus with foot ulcer L97.518 Non-pressure chronic ulcer of other part of right foot with other specified severity Follow-up Appointments ppointment in 2 weeks. Margarita Grizzle on Wednesday Return A Nurse Visit: - next Wednesday 12/22/21 Other: - [place] topical compounding antibiotics- bring to your appointments. ***only apply to medial portion  of wound that's open*** bring in compression stockings to weekly appts. Anesthetic (In clinic) Topical Lidocaine 5% applied to wound bed Bathing/ Shower/ Hygiene May shower with protection but do not get wound dressing(s) wet. TYRELLE, RACZKA ([id number]) [id number] Page 5 of 10 Edema Control - Lymphedema / SCD / Other Elevate legs to the level of the heart or above for 30 minutes daily and/or when sitting, a frequency of: - 3-4 times a day throughout the day. Avoid standing for long periods of time. Exercise regularly Moisturize legs daily. - apply every night before bed to left leg. Compression stocking or Garment 30-40 mm/Hg pressure to: - wear your VIVE compression stocking to left leg. apply in the morning and remove at night. Will apply tubigrip size [name] to left leg. Remove once you get home and use your compression stockings. Wound Treatment Wound #7 - Malleolus Wound Laterality: Right, Medial Peri-Wound Care: Triamcinolone 15 (g) 1 x Per Week/30 Days Discharge Instructions: Use triamcinolone 15 (g) as directed Peri-Wound Care: Sween Lotion (Moisturizing lotion) 1 x Per Week/30 Days Discharge Instructions: Apply moisturizing lotion as directed Topical: Keystone topical  compounding antibiotics 1 x Per Week/30 Days Discharge Instructions: ****APPLY LIGHTLY**** to medial portion of wound. Prim Dressing: Promogran Prisma Matrix, 4.34 (sq in) (silver collagen) 1 x Per Week/30 Days ary Discharge Instructions: Moisten collagen with saline or hydrogel Secondary Dressing: ABD Pad, 8x10 1 x Per Week/30 Days Discharge Instructions: Apply over primary dressing as directed. Compression Wrap: ThreePress (3 layer compression wrap) 1 x Per Week/30 Days Discharge Instructions: Apply three layer use kerlix instead of cotton compression as directed. Electronic Signature(s) Signed: 12/15/2021 4:11:00 PM By: [name] PA-C Signed: 01/12/2022 5:35:40 PM By: Rhae Hammock RN Entered By: Rhae Hammock on 12/15/2021 15:31:51 -------------------------------------------------------------------------------- Problem List Details Patient Name: Date of Service: [name], [name] 12/15/2021 2:30 PM Medical Record Number: [id number] Patient Account Number: [id number] Date of Birth/Sex: Treating RN: 01-May-1955 (66 y.o. M) Primary Care Provider: [name] Other Clinician: Referring Provider: Treating Provider/Extender: [name] in Treatment: 19 Active Problems ICD-10 Encounter Code Description Active Date MDM Diagnosis I89.0 Lymphedema, not elsewhere classified 08/04/2021 No Yes I87.333 Chronic venous hypertension (idiopathic) with ulcer and inflammation of 08/04/2021 No Yes bilateral lower extremity L97.828 Non-pressure chronic ulcer of other part of left lower leg with other specified 08/04/2021 No Yes severity L97.818 Non-pressure chronic ulcer of other part of right lower leg with other specified 08/04/2021 No Yes severity E11.621 Type 2 diabetes mellitus with foot ulcer 08/04/2021 No Yes Liby, Britney [name] ([id number]) [id number] Page 6 of 10 L97.518 Non-pressure chronic ulcer of other part of right foot with other  specified 08/04/2021 No Yes severity Inactive Problems Resolved Problems Electronic Signature(s) Signed: 12/15/2021 2:33:24 PM By: [name] PA-C Entered By: [name] on 12/15/2021 14:33:24 -------------------------------------------------------------------------------- Progress Note Details Patient Name: Date of Service: [name], [name] 12/15/2021 2:30 PM Medical Record Number: [id number] Patient Account Number: [id number] Date of Birth/Sex: Treating RN: January 11, 1956 (66 y.o. M) Primary Care Provider: [name] Other Clinician: Referring Provider: Treating Provider/Extender: [name] in Treatment: 89 Subjective Chief Complaint Information obtained from Patient 08/04/2021; patient returns to clinic with bilateral leg wounds as well as areas on the right foot History of Present Illness (HPI) ADMISSION 03/22/2021 This is a 66 year old man with a past medical history significant for diabetes type 2, congestive heart failure, peripheral arterial disease, morbid obesity, venous insufficiency, and coronary artery disease.  He has been followed by [name] in podiatry, who performed a transmetatarsal amputation on the left foot in August 2022. He had issues healing that wound, but based upon Dr. [name] notes, ultimately the TMA wound healed. During his recovery from that surgery, however, ulcers opened up over the DIP joint of the right second and third toe. These have apparently closed and reopened multiple times. It sounds like one of the issues has been moisture accumulation and maceration of the tissues causing them to reopen. At his last visit with [name], on March 01, 2021, there continues to be problems with moisture and he was referred to wound care for further evaluation and management. He had a formal aortogram with runoff performed prior to his TMA. The findings are copied here: Patient has inline flow to both feet with no significant  flow-limiting lesion that would be amenable to percutaneous or open revascularization. He does have an element of small vessel disease and has a short segment occlusion of the distal anterior tibial/dorsalis pedis artery on the left foot but does have posterior tibial artery flow. Would recommend management of wounds with amputation of toes 2 and 3 on the right foot if the wounds do not heal and deteriorate. Transmetatarsal amputation on the left side has as good a blood supply as it is going to get and hopefully this will heal in the future. Formal ABIs were done in January 2023. They are normal bilaterally. ABI Findings: +---------+------------------+-----+----------+--------+ Right Rt Pressure (mmHg)IndexWaveform Comment  +---------+------------------+-----+----------+--------+ Brachial 160    +---------+------------------+-----+----------+--------+ PTA 192 1.06 monophasic  +---------+------------------+-----+----------+--------+ DP 159 0.88 monophasic  +---------+------------------+-----+----------+--------+ Great T oe145 0.80    +---------+------------------+-----+----------+--------+ +--------+------------------+-----+---------+-------+ Left Lt Pressure (mmHg)IndexWaveform Comment +--------+------------------+-----+---------+-------+ [id number]    +--------+------------------+-----+---------+-------+ PTA 204 1.13 triphasic  +--------+------------------+-----+---------+-------+ DP 194 1.07 biphasic   +--------+------------------+-----+---------+-------+ +-------+-----------+-----------+------------+------------+ [name] ([id number]) [id number] Page 7 of 10 ABI/TBIT oday's ABIT oday's TBIPrevious ABIPrevious TBI +-------+-----------+-----------+------------+------------+ Right 1.06 0.8 1.26 0.65  +-------+-----------+-----------+------------+------------+ Left 1.13 amputation  1.15 amputation  +-------+-----------+-----------+------------+------------+ Previous ABI on 08/06/20 at [facility] Pedal pressures falsely elevated due to medial calcification. Summary: Right: Resting right ankle-brachial index is within normal range. The right toe-brachial index is normal. Left: Resting left ankle-brachial index is within normal range. READMISSION 08/04/2021 Mr. Bergdoll is now a 66 year old man who I remember from this clinic many years ago I think he had a right lower extremity predominantly venous wound at the time. He was here for 1 visit in March of this year had wounds on his right second and third toes we apparently dressed them many and they healed so he did not come back. He is listed in [place] is being a diabetic although the patient denies this says he is verified it with his primary doctor. In any case over the last several weeks or so according the patient although these wounds look somewhat more chronic than that he has developed predominantly large wounds on the right medial and right lateral ankle smaller areas on the left leg and areas on the dorsal aspect of his right second and third toes. Its not clear how he has been dressing these. 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 The patient has known PAD. He had an angiogram in August 2022 at which time he had nonhealing wounds in both feet. On the left his major vessels in the thigh were all patent. He had three-vessel patent to the level  of the ankle. He had a very short occlusion in the left anterior tibial. On the right lower extremity the major vessels in his thigh were all patent. He had three-vessel runoff to the foot sluggish filling of the anterior tibial artery. He was felt to have some component of small vessel disease but nothing that was amenable or needed revascularization. It was recommended that he have amputation of  the second and third toes on the right foot if they did not heal He has been following with [name] of podiatry. [name] got him juxta lite stockings although I do not think he had them on properly he has uncontrolled edema in both legs Past medical history includes type 2 diabetes [although the patient really denies this], left TMA in 2022,lower extremity wounds in fact attendance at this clinic in 2009-2010, A-fib on Eliquis, chronic venous insufficiency with secondary lymphedema history of non-Hodgkin's lymphoma. 08-11-2021 upon evaluation today patient presents for follow-up evaluation he was seen last Wednesday initially for inspection here in our clinic. With that being said he tells me that he unfortunately has been having a lot of drainage and is actually coming through his wrap. Fortunately I do not see any evidence of active infection locally or systemically at this time which is great news. No fevers, chills, nausea, vomiting, or diarrhea. With that being said there does appear to be some evidence of local infection based on what I am seeing today. 08-18-2021 upon evaluation today patient appears to be doing okay currently in regard to his wounds with that being said that he is doing much better but still has a long ways to go to get to where he wanted to be. I think the infection is significantly improved. He has another week of the antibiotic at this point. 08-25-2021 upon evaluation today patient's wounds are actually doing decently well he has erythema has significantly improved. I think the cellulitis is under controlled I am going to continue him on 2 more weeks of the Levaquin at 500 mg this is a lower dose but I am hoping it will be better for him. 09-08-2021 upon evaluation today patient appears to be doing excellent in regard to his wounds. Since I last saw him he was actually in the hospital where he ended up having a pacemaker put in. Subsequently he tells me that he is actually  doing quite well although they were unsure whether they were going to do it due to the fact that he had the wounds on his legs. And then I am glad they did anything seem to be doing well. 09-15-2021 upon evaluation patient's wounds are actually showing signs of improvement. The right side wounds do appear to have some need for sharp debridement today and I Georgina Peer go ahead and proceed with that. I think that if we get the wounds cleaned up he will actually show signs of continued improvement. I am also leaning towards switching to [facility] which I think will be a much better option for him. 09-22-2021 patient's wounds are showing signs of excellent improvement. I am actually extremely pleased with where we stand and I think that the patient is making great progress. There does not appear to be any signs of active infection. 09-29-2021 upon evaluation today patient appears to be doing excellent in regard to his wounds. He is actually tolerating the dressing changes without complication. Fortunately I see no evidence of active infection locally or systemically at this time which is great news  and overall I am extremely pleased with where we stand currently. 09-2721 upon evaluation today patient actually appears to be doing excellent in regard to his wounds. The left leg is almost completely healed the right ankle is significantly smaller. Overall I am extremely pleased with where we stand at this point. I do not see any evidence of active infection at this time. 10-13-2021 upon evaluation today patient appears to be doing excellent in regard to his wounds. I really feel like he is making good progress here and I am very pleased in that regard. Fortunately I do not see any signs of active infection at this time. We are using the [facility] topical antibiotic therapy. 10-20-2021 upon evaluation today patient appears to be doing well with regard to his wound on the right medial ankle region the left leg is  almost completely healed. I am actually very pleased with where we stand today. 11-03-2021 upon evaluation today patient's wound actually is going require some sharp debridement but appears to be doing much better which is great news. Fortunately I do not see any signs of active infection at this time. 11-10-2021 upon evaluation today patient's wound is actually showing signs of excellent improvement. Fortunately I do not see any evidence of infection locally or systemically which is great news and overall I am extremely pleased with where we stand today. I do believe he is making good progress he does have his Keystone topical antibiotics with him here today. 11-24-2021 upon evaluation today patient actually showing signs of excellent improvement this appears to be doing much better. Fortunately I do not see any evidence of infection locally or systemically at this time. 12-01-2021 upon evaluation today patient appears to be doing well currently in regard to his wound. He has been tolerating the dressing changes without complication. Fortunately I do not see any evidence of active infection at this time which is great news and overall I am extremely pleased with where we stand today. [name], [name] ([id number]) [id number] Page 8 of 10 12-15-2021 upon evaluation today patient appears to be doing well with regard to his wound. He is showing signs of improvement is slow but nonetheless we are making improvements here. Fortunately I do not see any evidence of active infection locally nor systemically at this time. We are still using the [facility] topical antibiotic over the open area only. Objective Constitutional Obese and well-hydrated in no acute distress. Vitals Time Taken: 3:15 PM, Height: 71 in, Weight: 350 lbs, BMI: 48.8, Temperature: 99 F, Pulse: 80 bpm, Respiratory Rate: 20 breaths/min, Blood Pressure: 132/72 mmHg. Respiratory normal breathing without  difficulty. Psychiatric this patient is able to make decisions and demonstrates good insight into disease process. Alert and Oriented x 3. pleasant and cooperative. General Notes: Upon inspection patient's wound bed actually showed signs of the need for sharp debridement I did perform some debridement clearway some of the slough and biofilm on the surface of the wound down to good subcutaneous tissue he tolerated that without complication and postdebridement the wound bed appears to be doing much better which is great news. Integumentary (Hair, Skin) Wound #7 status is Open. Original cause of wound was Blister. The date acquired was: 07/21/2021. The wound has been in treatment 19 weeks. The wound is located on the Right,Medial Malleolus. The wound measures 4cm length x 1.4cm width x 0.2cm depth; 4.398cm^2 area and 0.88cm^3 volume. There is Fat Layer (Subcutaneous Tissue) exposed. There is no tunneling or undermining noted. There is a medium  amount of serosanguineous drainage noted. The wound margin is distinct with the outline attached to the wound base. There is large (67-100%) pink, pale granulation within the wound bed. There is no necrotic tissue within the wound bed. The periwound skin appearance exhibited: Hemosiderin Staining. The periwound skin appearance did not exhibit: Callus, Crepitus, Excoriation, Induration, Rash, Scarring, Dry/Scaly, Maceration, Atrophie Blanche, Cyanosis, Ecchymosis, Mottled, Pallor, Rubor, Erythema. Assessment Active Problems ICD-10 Lymphedema, not elsewhere classified Chronic venous hypertension (idiopathic) with ulcer and inflammation of bilateral lower extremity Non-pressure chronic ulcer of other part of left lower leg with other specified severity Non-pressure chronic ulcer of other part of right lower leg with other specified severity Type 2 diabetes mellitus with foot ulcer Non-pressure chronic ulcer of other part of right foot with other specified  severity Procedures Wound #7 Pre-procedure diagnosis of Wound #7 is a Venous Leg Ulcer located on the Right,Medial Malleolus .Severity of Tissue Pre Debridement is: Fat layer exposed. There was a Excisional Skin/Subcutaneous Tissue Debridement with a total area of 5.6 sq cm performed by [name], PA. With the following instrument(s): Curette to remove Viable and Non-Viable tissue/material. Material removed includes Subcutaneous Tissue and Slough and after achieving pain control using Lidocaine. No specimens were taken. A time out was conducted at 15:32, prior to the start of the procedure. A Minimum amount of bleeding was controlled with Pressure. The procedure was tolerated well with a pain level of 0 throughout and a pain level of 0 following the procedure. Post Debridement Measurements: 4cm length x 1.4cm width x 0.2cm depth; 0.88cm^3 volume. Character of Wound/Ulcer Post Debridement is improved. Severity of Tissue Post Debridement is: Fat layer exposed. Post procedure Diagnosis Wound #7: Same as Pre-Procedure Pre-procedure diagnosis of Wound #7 is a Venous Leg Ulcer located on the Right,Medial Malleolus . There was a Three Layer Compression Therapy Procedure by Rhae Hammock, RN. Post procedure Diagnosis Wound #7: Same as Pre-Procedure Plan Follow-up Appointments: Return Appointment in 2 weeks. Margarita Grizzle on Wednesday Nurse Visit: - next Wednesday 12/22/21 ELISEO, WITHERS [name] ([id number]) [id number] Page 9 of 10 Other: - [place] topical compounding antibiotics- bring to your appointments. ***only apply to medial portion of wound that's open*** bring in compression stockings to weekly appts. Anesthetic: (In clinic) Topical Lidocaine 5% applied to wound bed Bathing/ Shower/ Hygiene: May shower with protection but do not get wound dressing(s) wet. Edema Control - Lymphedema / SCD / Other: Elevate legs to the level of the heart or above for 30 minutes  daily and/or when sitting, a frequency of: - 3-4 times a day throughout the day. Avoid standing for long periods of time. Exercise regularly Moisturize legs daily. - apply every night before bed to left leg. Compression stocking or Garment 30-40 mm/Hg pressure to: - wear your VIVE compression stocking to left leg. apply in the morning and remove at night. Will apply tubigrip size [name] to left leg. Remove once you get home and use your compression stockings. WOUND #7: - Malleolus Wound Laterality: Right, Medial Peri-Wound Care: Triamcinolone 15 (g) 1 x Per Week/30 Days Discharge Instructions: Use triamcinolone 15 (g) as directed Peri-Wound Care: Sween Lotion (Moisturizing lotion) 1 x Per Week/30 Days Discharge Instructions: Apply moisturizing lotion as directed Topical: Keystone topical compounding antibiotics 1 x Per Week/30 Days Discharge Instructions: ****APPLY LIGHTLY**** to medial portion of wound. Prim Dressing: Promogran Prisma Matrix, 4.34 (sq in) (silver collagen) 1 x Per Week/30 Days ary Discharge Instructions: Moisten collagen with saline or hydrogel Secondary Dressing: ABD  Pad, 8x10 1 x Per Week/30 Days Discharge Instructions: Apply over primary dressing as directed. Com pression Wrap: ThreePress (3 layer compression wrap) 1 x Per Week/30 Days Discharge Instructions: Apply three layer use kerlix instead of cotton compression as directed. 1. I am going to recommend that we have the patient continue to monitor for any signs of infection or worsening. Obviously if anything changes he knows in contact the office let me know. 2. I am also can recommend that the patient should continue to elevate his legs much as possible were using a 3 layer compression wrap which seems to be doing well. 3. We will continue as well with the silver collagen to the open area after applying the [place]. We will see patient back for reevaluation in 2 weeks here in the clinic. If anything worsens or changes  patient will contact our office for additional recommendations. Will see him in 1 week for nurse visit. Electronic Signature(s) Signed: 12/15/2021 3:42:03 PM By: [name] PA-C Entered By: [name] on 12/15/2021 15:42:02 -------------------------------------------------------------------------------- SuperBill Details Patient Name: Date of Service: Casino, [name] 12/15/2021 Medical Record Number: [id number] Patient Account Number: [id number] Date of Birth/Sex: Treating RN: Sep 08, 1955 (66 y.o. [name], [name] Primary Care Provider: [name] Other Clinician: Referring Provider: Treating Provider/Extender: [name] in Treatment: 19 Diagnosis Coding ICD-10 Codes Code Description I89.0 Lymphedema, not elsewhere classified I87.333 Chronic venous hypertension (idiopathic) with ulcer and inflammation of bilateral lower extremity L97.828 Non-pressure chronic ulcer of other part of left lower leg with other specified severity L97.818 Non-pressure chronic ulcer of other part of right lower leg with other specified severity E11.621 Type 2 diabetes mellitus with foot ulcer L97.518 Non-pressure chronic ulcer of other part of right foot with other specified severity Facility Procedures Physician Procedures : CPT4 Code Description Modifier [id number] - WC PHYS SUBQ TISS 20 SQ CM ICD-10 Diagnosis Description L97.818 Non-pressure chronic ulcer of other part of right lower leg with other specified severity Quantity: 1 Electronic Signature(s) Signed: 12/15/2021 3:42:31 PM By: [name] PA-C Entered By: [name] on 12/15/2021 15:42:31

## 2021-12-16 LAB — CUP PACEART REMOTE DEVICE CHECK
Battery Remaining Longevity: 75 mo
Battery Voltage: 3.06 V
Brady Statistic AP VP Percent: 93.46 %
Brady Statistic AP VS Percent: 0 %
Brady Statistic AS VP Percent: 6.11 %
Brady Statistic AS VS Percent: 0.43 %
Brady Statistic RA Percent Paced: 67.42 %
Brady Statistic RV Percent Paced: 69.05 %
Date Time Interrogation Session: 20231207211259
HighPow Impedance: 77 Ohm
Implantable Lead Connection Status: 753985
Implantable Lead Connection Status: 753985
Implantable Lead Implant Date: 20230823
Implantable Lead Implant Date: 20230823
Implantable Lead Location: 753859
Implantable Lead Location: 753860
Implantable Lead Model: 3830
Implantable Lead Model: 6935
Implantable Pulse Generator Implant Date: 20230823
Lead Channel Impedance Value: 304 Ohm
Lead Channel Impedance Value: 399 Ohm
Lead Channel Impedance Value: 456 Ohm
Lead Channel Sensing Intrinsic Amplitude: 12 mV
Lead Channel Sensing Intrinsic Amplitude: 12 mV
Lead Channel Sensing Intrinsic Amplitude: 14 mV
Lead Channel Sensing Intrinsic Amplitude: 14 mV
Lead Channel Setting Pacing Amplitude: 1.5 V
Lead Channel Setting Pacing Amplitude: 3.5 V
Lead Channel Setting Pacing Pulse Width: 0.4 ms
Lead Channel Setting Sensing Sensitivity: 0.3 mV
Zone Setting Status: 755011

## 2021-12-20 ENCOUNTER — Ambulatory Visit: Payer: Medicare Other | Admitting: *Deleted

## 2021-12-20 ENCOUNTER — Other Ambulatory Visit: Payer: Self-pay | Admitting: Pulmonary Disease

## 2021-12-20 DIAGNOSIS — L97511 Non-pressure chronic ulcer of other part of right foot limited to breakdown of skin: Secondary | ICD-10-CM

## 2021-12-20 DIAGNOSIS — E08621 Diabetes mellitus due to underlying condition with foot ulcer: Secondary | ICD-10-CM

## 2021-12-20 NOTE — Progress Notes (Signed)
Patient presents today for an orthotic adjustment. He has a toe filler diabetic insole for his left foot and is having trouble fitting it into his shoe.  No modifications were made to the orthotic. I was able to get the insole in his shoe without problem.   Patient will follow up as needed.

## 2021-12-21 LAB — CUP PACEART REMOTE DEVICE CHECK
Battery Remaining Longevity: 74 mo
Battery Voltage: 3.05 V
Brady Statistic AP VP Percent: 98.03 %
Brady Statistic AP VS Percent: 0 %
Brady Statistic AS VP Percent: 1.52 %
Brady Statistic AS VS Percent: 0.46 %
Brady Statistic RA Percent Paced: 67.78 %
Brady Statistic RV Percent Paced: 66.96 %
Date Time Interrogation Session: 20231212012506
HighPow Impedance: 76 Ohm
Implantable Lead Connection Status: 753985
Implantable Lead Connection Status: 753985
Implantable Lead Implant Date: 20230823
Implantable Lead Implant Date: 20230823
Implantable Lead Location: 753859
Implantable Lead Location: 753860
Implantable Lead Model: 3830
Implantable Lead Model: 6935
Implantable Pulse Generator Implant Date: 20230823
Lead Channel Impedance Value: 323 Ohm
Lead Channel Impedance Value: 399 Ohm
Lead Channel Impedance Value: 437 Ohm
Lead Channel Sensing Intrinsic Amplitude: 13.375 mV
Lead Channel Sensing Intrinsic Amplitude: 13.375 mV
Lead Channel Sensing Intrinsic Amplitude: 5.875 mV
Lead Channel Sensing Intrinsic Amplitude: 5.875 mV
Lead Channel Setting Pacing Amplitude: 1.5 V
Lead Channel Setting Pacing Amplitude: 3.5 V
Lead Channel Setting Pacing Pulse Width: 0.4 ms
Lead Channel Setting Sensing Sensitivity: 0.3 mV
Zone Setting Status: 755011

## 2021-12-22 ENCOUNTER — Encounter (HOSPITAL_BASED_OUTPATIENT_CLINIC_OR_DEPARTMENT_OTHER): Payer: Medicare Other | Admitting: Internal Medicine

## 2021-12-22 DIAGNOSIS — L97828 Non-pressure chronic ulcer of other part of left lower leg with other specified severity: Secondary | ICD-10-CM | POA: Diagnosis not present

## 2021-12-22 DIAGNOSIS — L97818 Non-pressure chronic ulcer of other part of right lower leg with other specified severity: Secondary | ICD-10-CM | POA: Diagnosis not present

## 2021-12-22 DIAGNOSIS — I872 Venous insufficiency (chronic) (peripheral): Secondary | ICD-10-CM | POA: Diagnosis not present

## 2021-12-22 DIAGNOSIS — M109 Gout, unspecified: Secondary | ICD-10-CM | POA: Diagnosis not present

## 2021-12-22 DIAGNOSIS — I87333 Chronic venous hypertension (idiopathic) with ulcer and inflammation of bilateral lower extremity: Secondary | ICD-10-CM | POA: Diagnosis not present

## 2021-12-22 DIAGNOSIS — E11621 Type 2 diabetes mellitus with foot ulcer: Secondary | ICD-10-CM | POA: Diagnosis not present

## 2021-12-22 DIAGNOSIS — I890 Lymphedema, not elsewhere classified: Secondary | ICD-10-CM | POA: Diagnosis not present

## 2021-12-22 DIAGNOSIS — I110 Hypertensive heart disease with heart failure: Secondary | ICD-10-CM | POA: Diagnosis not present

## 2021-12-22 DIAGNOSIS — M1990 Unspecified osteoarthritis, unspecified site: Secondary | ICD-10-CM | POA: Diagnosis not present

## 2021-12-22 DIAGNOSIS — I509 Heart failure, unspecified: Secondary | ICD-10-CM | POA: Diagnosis not present

## 2021-12-22 DIAGNOSIS — Z7901 Long term (current) use of anticoagulants: Secondary | ICD-10-CM | POA: Diagnosis not present

## 2021-12-22 DIAGNOSIS — L97518 Non-pressure chronic ulcer of other part of right foot with other specified severity: Secondary | ICD-10-CM | POA: Diagnosis not present

## 2021-12-22 NOTE — Progress Notes (Signed)
[name] ([id number]) [id number] Page 1 of 3 Visit Report for 12/22/2021 Arrival Information Details Patient Name: Date of Service: [name], [name] 12/22/2021 10:15 A M Medical Record Number: [id number] Patient Account Number: [id number] Date of Birth/Sex: Treating RN: April 24, 1955 (66 y.o. M) Primary Care [name]: [name] Other Clinician: Referring [name]: Treating [name]/Extender: [name] in Treatment: 37 Visit Information History Since Last Visit Added or deleted any medications: No Patient Arrived: [name] Any new allergies or adverse reactions: No Arrival Time: 11:54 Had a fall or experienced change in No Accompanied By: friend activities of daily living that may affect Transfer Assistance: None risk of falls: Patient Identification Verified: Yes Signs or symptoms of abuse/neglect since last visito No Secondary Verification Process Completed: Yes Hospitalized since last visit: No Patient Requires Transmission-Based Precautions: No Implantable device outside of the clinic excluding No Patient Has Alerts: Yes cellular tissue based products placed in the center Patient Alerts: Patient on Blood Thinner since last visit: 01/2021 ABI L 1.13 R 1.06 Has Compression in Place as Prescribed: Yes 01/2021 TBI L amp R 0.8 Pain Present Now: No Electronic Signature(s) Signed: 12/22/2021 12:00:46 PM By: Erenest Blank Entered By: Erenest Blank on 12/22/2021 11:54:46 -------------------------------------------------------------------------------- Encounter Discharge Information Details Patient Name: Date of Service: [name] Gloss [name]. 12/22/2021 10:15 A M Medical Record Number: [id number] Patient Account Number: [id number] Date of Birth/Sex: Treating RN: [birth year] (66 y.o. M) Primary Care Zailynn Brandel: [name] Other Clinician: Erenest Blank Referring [name]: Treating [name]/Extender: [name] in Treatment: 20 Encounter Discharge Information Items Discharge Condition: Stable Ambulatory Status: Walker Discharge Destination: Home Transportation: Private Auto Accompanied By: friend Schedule Follow-up Appointment: Yes Clinical Summary of Care: Electronic Signature(s) Signed: 12/22/2021 12:00:46 PM By: Erenest Blank Entered By: Erenest Blank on 12/22/2021 11:55:58 [name], [name] ([id number]) [id number] Page 2 of 3 -------------------------------------------------------------------------------- Patient/Caregiver Education Details Patient Name: Date of Service: [name] 12/13/2023andnbsp10:15 A M Medical Record Number: [id number] Patient Account Number: [id number] Date of Birth/Gender: Treating RN: 1955-09-24 (66 y.o. M) Primary Care Physician: [name] Other Clinician: Erenest Blank Referring Physician: Treating Physician/Extender: [name] in Treatment: 20 Education Assessment Education Provided To: Patient Education Topics Provided Electronic Signature(s) Signed: 12/22/2021 12:00:46 PM By: Erenest Blank Entered By: Erenest Blank on 12/22/2021 11:55:36 -------------------------------------------------------------------------------- Wound Assessment Details Patient Name: Date of Service: [name], [name]. 12/22/2021 10:15 A M Medical Record Number: [id number] Patient Account Number: [id number] Date of Birth/Sex: Treating RN: August 16, 1955 (66 y.o. M) Primary Care Georga Stys: [name] Other Clinician: Referring [name]: Treating [name]/Extender: [name] in Treatment: 20 Wound Status Wound Number: 7 Primary Etiology: Venous Leg Ulcer Wound Location: Right, Medial Malleolus Secondary Etiology: Lymphedema Wounding Event: Blister Wound Status: Open Date Acquired: 07/21/2021 Weeks Of Treatment: 20 Clustered Wound: No Wound Measurements Length: (cm)  4 Width: (cm) 1.4 Depth: (cm) 0.2 Area: (cm) 4.398 Volume: (cm) 0.88 % Reduction in Area: 88.5% % Reduction in Volume: 96.7% Wound Description Classification: Full Thickness With Exposed Support S Exudate Amount: Medium Exudate Type: Serosanguineous Exudate Color: red, brown tructures Periwound Skin Texture Texture Color No Abnormalities Noted: No No Abnormalities Noted: No Moisture No Abnormalities Noted: No Treatment Notes [name], [name] ([id number]) [id number] Page 3 of 3 Wound #7 (Malleolus) Wound Laterality: Right, Medial Cleanser Peri-Wound Care Triamcinolone 15 (g) Discharge Instruction: Use triamcinolone 15 (g) as directed Sween Lotion (Moisturizing lotion) Discharge Instruction: Apply moisturizing lotion as directed Topical Keystone topical compounding antibiotics Discharge  Instruction: ****APPLY LIGHTLY**** to medial portion of wound. Primary Dressing Promogran Prisma Matrix, 4.34 (sq in) (silver collagen) Discharge Instruction: Moisten collagen with saline or hydrogel Secondary Dressing ABD Pad, 8x10 Discharge Instruction: Apply over primary dressing as directed. Secured With Compression Wrap ThreePress (3 layer compression wrap) Discharge Instruction: Apply three layer use kerlix instead of cotton compression as directed. Compression Stockings Add-Ons Electronic Signature(s) Signed: 12/22/2021 12:00:46 PM By: Erenest Blank Entered By: Erenest Blank on 12/22/2021 11:55:08 -------------------------------------------------------------------------------- Vitals Details Patient Name: Date of Service: [name], [name] 12/22/2021 10:15 A M Medical Record Number: [id number] Patient Account Number: [id number] Date of Birth/Sex: Treating RN: 11/29/1955 (66 y.o. M) Primary Care Abriana Saltos: [name] Other Clinician: Referring Modene Andy: Treating Lleyton Byers/Extender: [name] in Treatment: 20 Vital  Signs Time Taken: 11:00 Reference Range: 80 - 120 mg / dl Height (in): 71 Weight (lbs): 350 Body Mass Index (BMI): 48.8 Electronic Signature(s) Signed: 12/22/2021 12:00:46 PM By: Erenest Blank Entered By: Erenest Blank on 12/22/2021 11:54:56

## 2021-12-23 NOTE — Progress Notes (Signed)
KIEN, MIRSKY ([id number]) [id number] Page 1 of 1 Visit Report for 12/22/2021 SuperBill Details Patient Name: Date of Service: [name], [name] 12/22/2021 Medical Record Number: [id number] Patient Account Number: [id number] Date of Birth/Sex: Treating RN: April 08, 1955 (66 y.o. M) Primary Care Provider: [name] Other Clinician: Referring Provider: Treating Provider/Extender: Estelle Grumbles in Treatment: 20 Diagnosis Coding ICD-10 Codes Code Description I89.0 Lymphedema, not elsewhere classified I87.333 Chronic venous hypertension (idiopathic) with ulcer and inflammation of bilateral lower extremity L97.828 Non-pressure chronic ulcer of other part of left lower leg with other specified severity L97.818 Non-pressure chronic ulcer of other part of right lower leg with other specified severity E11.621 Type 2 diabetes mellitus with foot ulcer L97.518 Non-pressure chronic ulcer of other part of right foot with other specified severity Facility Procedures CPT4 Code Description Modifier Quantity [id number] (Facility Use Only) [phone] - APPLY MULTLAY COMPRS LWR RT LEG 1 Electronic Signature(s) Signed: 12/22/2021 12:00:46 PM By: Erenest Blank Signed: 12/22/2021 5:47:10 PM By: Linton Ham MD Entered By: Erenest Blank on 12/22/2021 11:56:16

## 2021-12-29 ENCOUNTER — Encounter (HOSPITAL_BASED_OUTPATIENT_CLINIC_OR_DEPARTMENT_OTHER): Payer: Medicare Other | Admitting: Physician Assistant

## 2021-12-29 DIAGNOSIS — E11621 Type 2 diabetes mellitus with foot ulcer: Secondary | ICD-10-CM | POA: Diagnosis not present

## 2021-12-29 DIAGNOSIS — M109 Gout, unspecified: Secondary | ICD-10-CM | POA: Diagnosis not present

## 2021-12-29 DIAGNOSIS — Z7901 Long term (current) use of anticoagulants: Secondary | ICD-10-CM | POA: Diagnosis not present

## 2021-12-29 DIAGNOSIS — M1990 Unspecified osteoarthritis, unspecified site: Secondary | ICD-10-CM | POA: Diagnosis not present

## 2021-12-29 DIAGNOSIS — L97828 Non-pressure chronic ulcer of other part of left lower leg with other specified severity: Secondary | ICD-10-CM | POA: Diagnosis not present

## 2021-12-29 DIAGNOSIS — I872 Venous insufficiency (chronic) (peripheral): Secondary | ICD-10-CM | POA: Diagnosis not present

## 2021-12-29 DIAGNOSIS — L97312 Non-pressure chronic ulcer of right ankle with fat layer exposed: Secondary | ICD-10-CM | POA: Diagnosis not present

## 2021-12-29 DIAGNOSIS — L97818 Non-pressure chronic ulcer of other part of right lower leg with other specified severity: Secondary | ICD-10-CM | POA: Diagnosis not present

## 2021-12-29 DIAGNOSIS — I890 Lymphedema, not elsewhere classified: Secondary | ICD-10-CM | POA: Diagnosis not present

## 2021-12-29 DIAGNOSIS — I509 Heart failure, unspecified: Secondary | ICD-10-CM | POA: Diagnosis not present

## 2021-12-29 DIAGNOSIS — L97518 Non-pressure chronic ulcer of other part of right foot with other specified severity: Secondary | ICD-10-CM | POA: Diagnosis not present

## 2021-12-29 DIAGNOSIS — I110 Hypertensive heart disease with heart failure: Secondary | ICD-10-CM | POA: Diagnosis not present

## 2021-12-29 DIAGNOSIS — I87333 Chronic venous hypertension (idiopathic) with ulcer and inflammation of bilateral lower extremity: Secondary | ICD-10-CM | POA: Diagnosis not present

## 2021-12-29 NOTE — Progress Notes (Signed)
CHEROKEE, CLOWERS ([id number]) [id number] Page 1 of 10 Visit Report for 12/29/2021 Chief Complaint Document Details Patient Name: Date of Service: [name], [name] 12/29/2021 2:45 PM Medical Record Number: [id number] Patient Account Number: [id number] Date of Birth/Sex: Treating RN: 1955/03/22 (66 y.o. M) Primary Care Provider: [name] Other Clinician: Referring Provider: Treating Provider/Extender: [name] in Treatment: 21 Information Obtained from: Patient Chief Complaint 08/04/2021; patient returns to clinic with bilateral leg wounds as well as areas on the right foot Electronic Signature(s) Signed: 12/29/2021 3:50:24 PM By: [name] PA-C Entered By: [name] on 12/29/2021 15:50:24 -------------------------------------------------------------------------------- Debridement Details Patient Name: Date of Service: [name], DA Marzetta Merino [name]. 12/29/2021 2:45 PM Medical Record Number: [id number] Patient Account Number: [id number] Date of Birth/Sex: Treating RN: 03-24-1955 (66 y.o. [name], [name] Primary Care Provider: [name] Other Clinician: Referring Provider: Treating Provider/Extender: [name] in Treatment: 21 Debridement Performed for Assessment: Wound #7 Right,Medial Malleolus Performed By: Physician [name], PA Debridement Type: Debridement Severity of Tissue Pre Debridement: Fat layer exposed Level of Consciousness (Pre-procedure): Awake and Alert Pre-procedure Verification/Time Out Yes - 16:00 Taken: Start Time: 16:00 Pain Control: Lidocaine T Area Debrided (L x W): otal 3.8 (cm) x 2 (cm) = 7.6 (cm) Tissue and other material debrided: Viable, Non-Viable, Slough, Subcutaneous, Slough Level: Skin/Subcutaneous Tissue Debridement Description: Excisional Instrument: Curette Bleeding: Minimum Hemostasis Achieved: Pressure End Time: 16:00 Procedural Pain: 0 Post  Procedural Pain: 0 Response to Treatment: Procedure was tolerated well Level of Consciousness (Post- Awake and Alert procedure): Post Debridement Measurements of Total Wound Length: (cm) 3.8 Width: (cm) 2 Depth: (cm) 0.2 Volume: (cm) 1.194 Character of Wound/Ulcer Post Debridement: Improved Severity of Tissue Post Debridement: Fat layer exposed [name] ([id number]) [id number] Page 2 of 10 Post Procedure Diagnosis Same as Pre-procedure Electronic Signature(s) Signed: 12/29/2021 5:39:23 PM By: Rhae Hammock RN Signed: 12/29/2021 6:40:18 PM By: [name] PA-C Entered By: Rhae Hammock on 12/29/2021 16:03:45 -------------------------------------------------------------------------------- HPI Details Patient Name: Date of Service: Sales, DA [name] 12/29/2021 2:45 PM Medical Record Number: [id number] Patient Account Number: [id number] Date of Birth/Sex: Treating RN: Dec 27, 1955 (66 y.o. M) Primary Care Provider: [name] Other Clinician: Referring Provider: Treating Provider/Extender: [name] in Treatment: 21 History of Present Illness HPI Description: ADMISSION 03/22/2021 This is a 66 year old man with a past medical history significant for diabetes type 2, congestive heart failure, peripheral arterial disease, morbid obesity, venous insufficiency, and coronary artery disease. He has been followed by [name] in podiatry, who performed a transmetatarsal amputation on the left foot in August 2022. He had issues healing that wound, but based upon Dr. [name] notes, ultimately the TMA wound healed. During his recovery from that surgery, however, ulcers opened up over the DIP joint of the right second and third toe. These have apparently closed and reopened multiple times. It sounds like one of the issues has been moisture accumulation and maceration of the tissues causing them to reopen. At his last visit  with [name], on March 01, 2021, there continues to be problems with moisture and he was referred to wound care for further evaluation and management. He had a formal aortogram with runoff performed prior to his TMA. The findings are copied here: Patient has inline flow to both feet with no significant flow-limiting lesion that would be amenable to percutaneous or open revascularization. He does have an element of small vessel disease and  has a short segment occlusion of the distal anterior tibial/dorsalis pedis artery on the left foot but does have posterior tibial artery flow. Would recommend management of wounds with amputation of toes 2 and 3 on the right foot if the wounds do not heal and deteriorate. Transmetatarsal amputation on the left side has as good a blood supply as it is going to get and hopefully this will heal in the future. Formal ABIs were done in January 2023. They are normal bilaterally. ABI Findings: +---------+------------------+-----+----------+--------+ Right Rt Pressure (mmHg)IndexWaveform Comment  +---------+------------------+-----+----------+--------+ Brachial 160     +---------+------------------+-----+----------+--------+ PTA 192 1.06 monophasic  +---------+------------------+-----+----------+--------+ DP 159 0.88 monophasic  +---------+------------------+-----+----------+--------+ Great T oe145 0.80    +---------+------------------+-----+----------+--------+ +--------+------------------+-----+---------+-------+ Left Lt Pressure (mmHg)IndexWaveform Comment +--------+------------------+-----+---------+-------+ [id number]     +--------+------------------+-----+---------+-------+ PTA 204 1.13 triphasic  +--------+------------------+-----+---------+-------+ DP 194 1.07 biphasic    +--------+------------------+-----+---------+-------+ +-------+-----------+-----------+------------+------------+ ABI/TBIT oday's ABIT oday's TBIPrevious ABIPrevious TBI +-------+-----------+-----------+------------+------------+ Right 1.06 0.8 1.26 0.65  +-------+-----------+-----------+------------+------------+ Left 1.13 amputation 1.15 amputation  +-------+-----------+-----------+------------+------------+ Previous ABI on 08/06/20 at [facility] Pedal pressures falsely elevated due to medial calcification. MANNIE, OHLIN ([id number]) [id number] Page 3 of 10 Summary: Right: Resting right ankle-brachial index is within normal range. The right toe-brachial index is normal. Left: Resting left ankle-brachial index is within normal range. READMISSION 08/04/2021 Mr. Cabello is now a 66 year old man who I remember from this clinic many years ago I think he had a right lower extremity predominantly venous wound at the time. He was here for 1 visit in March of this year had wounds on his right second and third toes we apparently dressed them many and they healed so he did not come back. He is listed in  is being a diabetic although the patient denies this says he is verified it with his primary doctor. In any case over the last several weeks or so according the patient although these wounds look somewhat more chronic than that he has developed predominantly large wounds on the right medial and right lateral ankle smaller areas on the left leg and areas on the dorsal aspect of his right second and third toes. Its not clear how he has been dressing these. 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 The patient has known PAD. He had an angiogram in August 2022 at which time he had nonhealing wounds in both feet. On the left his major vessels in the thigh were all patent. He  had three-vessel patent to the level of the ankle. He had a very short occlusion in the left anterior tibial. On the right lower extremity the major vessels in his thigh were all patent. He had three-vessel runoff to the foot sluggish filling of the anterior tibial artery. He was felt to have some component of small vessel disease but nothing that was amenable or needed revascularization. It was recommended that he have amputation of the second and third toes on the right foot if they did not heal He has been following with [name] of podiatry. [name] got him juxta lite stockings although I do not think he had them on properly he has uncontrolled edema in both legs Past medical history includes type 2 diabetes [although the patient really denies this], left TMA in 2022,lower extremity wounds in fact attendance at this clinic in 2009-2010, A-fib on Eliquis, chronic venous insufficiency with secondary lymphedema history of non-Hodgkin's lymphoma. 08-11-2021 upon evaluation today patient presents for  follow-up evaluation he was seen last Wednesday initially for inspection here in our clinic. With that being said he tells me that he unfortunately has been having a lot of drainage and is actually coming through his wrap. Fortunately I do not see any evidence of active infection locally or systemically at this time which is great news. No fevers, chills, nausea, vomiting, or diarrhea. With that being said there does appear to be some evidence of local infection based on what I am seeing today. 08-18-2021 upon evaluation today patient appears to be doing okay currently in regard to his wounds with that being said that he is doing much better but still has a long ways to go to get to where he wanted to be. I think the infection is significantly improved. He has another week of the antibiotic at this point. 08-25-2021 upon evaluation today patient's wounds are actually doing decently well he has erythema has  significantly improved. I think the cellulitis is under controlled I am going to continue him on 2 more weeks of the Levaquin at 500 mg this is a lower dose but I am hoping it will be better for him. 09-08-2021 upon evaluation today patient appears to be doing excellent in regard to his wounds. Since I last saw him he was actually in the hospital where he ended up having a pacemaker put in. Subsequently he tells me that he is actually doing quite well although they were unsure whether they were going to do it due to the fact that he had the wounds on his legs. And then I am glad they did anything seem to be doing well. 09-15-2021 upon evaluation patient's wounds are actually showing signs of improvement. The right side wounds do appear to have some need for sharp debridement today and I Georgina Peer go ahead and proceed with that. I think that if we get the wounds cleaned up he will actually show signs of continued improvement. I am also leaning towards switching to [facility] which I think will be a much better option for him. 09-22-2021 patient's wounds are showing signs of excellent improvement. I am actually extremely pleased with where we stand and I think that the patient is making great progress. There does not appear to be any signs of active infection. 09-29-2021 upon evaluation today patient appears to be doing excellent in regard to his wounds. He is actually tolerating the dressing changes without complication. Fortunately I see no evidence of active infection locally or systemically at this time which is great news and overall I am extremely pleased with where we stand currently. 09-2721 upon evaluation today patient actually appears to be doing excellent in regard to his wounds. The left leg is almost completely healed the right ankle is significantly smaller. Overall I am extremely pleased with where we stand at this point. I do not see any evidence of active infection at this time. 10-13-2021  upon evaluation today patient appears to be doing excellent in regard to his wounds. I really feel like he is making good progress here and I am very pleased in that regard. Fortunately I do not see any signs of active infection at this time. We are using the [facility] topical antibiotic therapy. 10-20-2021 upon evaluation today patient appears to be doing well with regard to his wound on the right medial ankle region the left leg is almost completely healed. I am actually very pleased with where we stand today. 11-03-2021 upon evaluation today patient's wound actually  is going require some sharp debridement but appears to be doing much better which is great news. Fortunately I do not see any signs of active infection at this time. 11-10-2021 upon evaluation today patient's wound is actually showing signs of excellent improvement. Fortunately I do not see any evidence of infection locally or systemically which is great news and overall I am extremely pleased with where we stand today. I do believe he is making good progress he does have his Keystone topical antibiotics with him here today. 11-24-2021 upon evaluation today patient actually showing signs of excellent improvement this appears to be doing much better. Fortunately I do not see any evidence of infection locally or systemically at this time. 12-01-2021 upon evaluation today patient appears to be doing well currently in regard to his wound. He has been tolerating the dressing changes without complication. Fortunately I do not see any evidence of active infection at this time which is great news and overall I am extremely pleased with where we stand today. 12-15-2021 upon evaluation today patient appears to be doing well with regard to his wound. He is showing signs of improvement is slow but nonetheless we are making improvements here. Fortunately I do not see any evidence of active infection locally nor systemically at this time. We are still  using the [facility] topical antibiotic over the open area only. 12-29-2021 upon evaluation today patient's wound actually is showing signs of excellent improvement. It has been 2 weeks since I perform any debridement and it definitely shows he has some tissue that needs to be cleaned away but I think we can do so quite easily and readily today. The good news is I do not see any signs of infection I think he is doing much better in that regard. Overall I am extremely pleased with where we stand. Electronic Signature(s) Signed: 12/29/2021 5:58:06 PM By: [name] PA-C Entered By: [name] on 12/29/2021 17:58:06 Kerstetter, [name] ([id number]) [id number] Page 4 of 10 -------------------------------------------------------------------------------- Physical Exam Details Patient Name: Date of Service: GARALD, RHEW 12/29/2021 2:45 PM Medical Record Number: [id number] Patient Account Number: [id number] Date of Birth/Sex: Treating RN: 1955/09/07 (66 y.o. M) Primary Care Provider: [name] Other Clinician: Referring Provider: Treating Provider/Extender: [name] in Treatment: 21 Constitutional Chronically ill appearing but in no apparent acute distress. Respiratory normal breathing without difficulty. Psychiatric this patient is able to make decisions and demonstrates good insight into disease process. Alert and Oriented x 3. pleasant and cooperative. Notes Patient's wound actually is showing signs of good improvement I did have to perform some debridement of clearway the necrotic debris he tolerated that today without complication and postdebridement the wound bed appears to be doing much better which is awesome news. Electronic Signature(s) Signed: 12/29/2021 6:10:52 PM By: [name] PA-C Entered By: [name] on 12/29/2021  18:10:52 -------------------------------------------------------------------------------- Physician Orders Details Patient Name: Date of Service: Cuaresma, DA [name] 12/29/2021 2:45 PM Medical Record Number: [id number] Patient Account Number: [id number] Date of Birth/Sex: Treating RN: 1955/12/22 (66 y.o. [name], [name] Primary Care Provider: [name] Other Clinician: Referring Provider: Treating Provider/Extender: [name] in Treatment: 21 Verbal / Phone Orders: No Diagnosis Coding ICD-10 Coding Code Description I89.0 Lymphedema, not elsewhere classified I87.333 Chronic venous hypertension (idiopathic) with ulcer and inflammation of bilateral lower extremity L97.828 Non-pressure chronic ulcer of other part of left lower leg with other specified severity L97.818 Non-pressure chronic ulcer  of other part of right lower leg with other specified severity E11.621 Type 2 diabetes mellitus with foot ulcer L97.518 Non-pressure chronic ulcer of other part of right foot with other specified severity Follow-up Appointments ppointment in 2 weeks. Margarita Grizzle on Wednesday Return A Nurse Visit: - next Wednesday 01/05/22 Other: - Hold Keystone for now 12/29/21 Anesthetic (In clinic) Topical Lidocaine 5% applied to wound bed Bathing/ Shower/ Hygiene May shower with protection but do not get wound dressing(s) wet. KELLIS, MCADAM ([id number]) [id number] Page 5 of 10 Edema Control - Lymphedema / SCD / Other Elevate legs to the level of the heart or above for 30 minutes daily and/or when sitting, a frequency of: - 3-4 times a day throughout the day. Avoid standing for long periods of time. Exercise regularly Moisturize legs daily. - apply every night before bed to left leg. Compression stocking or Garment 30-40 mm/Hg pressure to: - wear your VIVE compression stocking to left leg. apply in the morning and remove at night. Will apply tubigrip size  [name] to left leg. Remove once you get home and use your compression stockings. Wound Treatment Wound #7 - Malleolus Wound Laterality: Right, Medial Peri-Wound Care: Triamcinolone 15 (g) 1 x Per Week/30 Days Discharge Instructions: Use triamcinolone 15 (g) as directed Peri-Wound Care: Sween Lotion (Moisturizing lotion) 1 x Per Week/30 Days Discharge Instructions: Apply moisturizing lotion as directed Prim Dressing: Promogran Prisma Matrix, 4.34 (sq in) (silver collagen) 1 x Per Week/30 Days ary Discharge Instructions: Moisten collagen with saline or hydrogel Secondary Dressing: Zetuvit Plus 4x8 in 1 x Per Week/30 Days Discharge Instructions: Apply over primary dressing as directed. Compression Wrap: ThreePress (3 layer compression wrap) 1 x Per Week/30 Days Discharge Instructions: Apply three layer use kerlix instead of cotton compression as directed. Electronic Signature(s) Signed: 12/29/2021 5:39:23 PM By: Rhae Hammock RN Signed: 12/29/2021 6:40:18 PM By: [name] PA-C Entered By: Rhae Hammock on 12/29/2021 16:05:00 -------------------------------------------------------------------------------- Problem List Details Patient Name: Date of Service: Eaves, DA [name] 12/29/2021 2:45 PM Medical Record Number: [id number] Patient Account Number: [id number] Date of Birth/Sex: Treating RN: 07-Feb-1955 (66 y.o. M) Primary Care Provider: [name] Other Clinician: Referring Provider: Treating Provider/Extender: [name] in Treatment: 21 Active Problems ICD-10 Encounter Code Description Active Date MDM Diagnosis I89.0 Lymphedema, not elsewhere classified 08/04/2021 No Yes I87.333 Chronic venous hypertension (idiopathic) with ulcer and inflammation of 08/04/2021 No Yes bilateral lower extremity L97.828 Non-pressure chronic ulcer of other part of left lower leg with other specified 08/04/2021 No Yes severity L97.818 Non-pressure chronic ulcer of other  part of right lower leg with other specified 08/04/2021 No Yes severity E11.621 Type 2 diabetes mellitus with foot ulcer 08/04/2021 No Yes Mitchum, Tammie [name] ([id number]) [id number] Page 6 of 10 L97.518 Non-pressure chronic ulcer of other part of right foot with other specified 08/04/2021 No Yes severity Inactive Problems Resolved Problems Electronic Signature(s) Signed: 12/29/2021 3:50:17 PM By: [name] PA-C Entered By: [name] on 12/29/2021 15:50:16 -------------------------------------------------------------------------------- Progress Note Details Patient Name: Date of Service: Mcwhirt, DA Marzetta Merino [name]. 12/29/2021 2:45 PM Medical Record Number: [id number] Patient Account Number: [id number] Date of Birth/Sex: Treating RN: July 17, 1955 (66 y.o. M) Primary Care Provider: [name] Other Clinician: Referring Provider: Treating Provider/Extender: [name] in Treatment: 21 Subjective Chief Complaint Information obtained from Patient 08/04/2021; patient returns to clinic with bilateral leg wounds as well as areas on the right foot History of Present Illness (HPI) ADMISSION 03/22/2021  This is a 66 year old man with a past medical history significant for diabetes type 2, congestive heart failure, peripheral arterial disease, morbid obesity, venous insufficiency, and coronary artery disease. He has been followed by [name] in podiatry, who performed a transmetatarsal amputation on the left foot in August 2022. He had issues healing that wound, but based upon Dr. [name] notes, ultimately the TMA wound healed. During his recovery from that surgery, however, ulcers opened up over the DIP joint of the right second and third toe. These have apparently closed and reopened multiple times. It sounds like one of the issues has been moisture accumulation and maceration of the tissues causing them to reopen. At his last visit with [name],  on March 01, 2021, there continues to be problems with moisture and he was referred to wound care for further evaluation and management. He had a formal aortogram with runoff performed prior to his TMA. The findings are copied here: Patient has inline flow to both feet with no significant flow-limiting lesion that would be amenable to percutaneous or open revascularization. He does have an element of small vessel disease and has a short segment occlusion of the distal anterior tibial/dorsalis pedis artery on the left foot but does have posterior tibial artery flow. Would recommend management of wounds with amputation of toes 2 and 3 on the right foot if the wounds do not heal and deteriorate. Transmetatarsal amputation on the left side has as good a blood supply as it is going to get and hopefully this will heal in the future. Formal ABIs were done in January 2023. They are normal bilaterally. ABI Findings: +---------+------------------+-----+----------+--------+ Right Rt Pressure (mmHg)IndexWaveform Comment  +---------+------------------+-----+----------+--------+ Brachial 160    +---------+------------------+-----+----------+--------+ PTA 192 1.06 monophasic  +---------+------------------+-----+----------+--------+ DP 159 0.88 monophasic  +---------+------------------+-----+----------+--------+ Great T oe145 0.80    +---------+------------------+-----+----------+--------+ +--------+------------------+-----+---------+-------+ Left Lt Pressure (mmHg)IndexWaveform Comment +--------+------------------+-----+---------+-------+ [id number]    +--------+------------------+-----+---------+-------+ PTA 204 1.13 triphasic  +--------+------------------+-----+---------+-------+ DP 194 1.07 biphasic   +--------+------------------+-----+---------+-------+ +-------+-----------+-----------+------------+------------+ ABI/TBIT oday's ABIT  oday's TBIPrevious ABIPrevious TBI +-------+-----------+-----------+------------+------------+ Right 1.06 0.8 1.26 0.65  Ernster, Samil [name] ([id number]) [id number] Page 7 of 10 +-------+-----------+-----------+------------+------------+ Left 1.13 amputation 1.15 amputation  +-------+-----------+-----------+------------+------------+ Previous ABI on 08/06/20 at [facility] Pedal pressures falsely elevated due to medial calcification. Summary: Right: Resting right ankle-brachial index is within normal range. The right toe-brachial index is normal. Left: Resting left ankle-brachial index is within normal range. READMISSION 08/04/2021 Mr. Goyne is now a 66 year old man who I remember from this clinic many years ago I think he had a right lower extremity predominantly venous wound at the time. He was here for 1 visit in March of this year had wounds on his right second and third toes we apparently dressed them many and they healed so he did not come back. He is listed in [place] is being a diabetic although the patient denies this says he is verified it with his primary doctor. In any case over the last several weeks or so according the patient although these wounds look somewhat more chronic than that he has developed predominantly large wounds on the right medial and right lateral ankle smaller areas on the left leg and areas on the dorsal aspect of his right second and third toes. Its not clear how he has been dressing these. 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 The patient has known PAD. He had an angiogram in August 2022  at which time he had nonhealing wounds in both feet. On the left his major vessels in the thigh were all patent. He had three-vessel patent to the level of the ankle. He had a very short occlusion in the left anterior tibial. On the right lower  extremity the major vessels in his thigh were all patent. He had three-vessel runoff to the foot sluggish filling of the anterior tibial artery. He was felt to have some component of small vessel disease but nothing that was amenable or needed revascularization. It was recommended that he have amputation of the second and third toes on the right foot if they did not heal He has been following with [name] of podiatry. [name] got him juxta lite stockings although I do not think he had them on properly he has uncontrolled edema in both legs Past medical history includes type 2 diabetes [although the patient really denies this], left TMA in 2022,lower extremity wounds in fact attendance at this clinic in 2009-2010, A-fib on Eliquis, chronic venous insufficiency with secondary lymphedema history of non-Hodgkin's lymphoma. 08-11-2021 upon evaluation today patient presents for follow-up evaluation he was seen last Wednesday initially for inspection here in our clinic. With that being said he tells me that he unfortunately has been having a lot of drainage and is actually coming through his wrap. Fortunately I do not see any evidence of active infection locally or systemically at this time which is great news. No fevers, chills, nausea, vomiting, or diarrhea. With that being said there does appear to be some evidence of local infection based on what I am seeing today. 08-18-2021 upon evaluation today patient appears to be doing okay currently in regard to his wounds with that being said that he is doing much better but still has a long ways to go to get to where he wanted to be. I think the infection is significantly improved. He has another week of the antibiotic at this point. 08-25-2021 upon evaluation today patient's wounds are actually doing decently well he has erythema has significantly improved. I think the cellulitis is under controlled I am going to continue him on 2 more weeks of the Levaquin at  500 mg this is a lower dose but I am hoping it will be better for him. 09-08-2021 upon evaluation today patient appears to be doing excellent in regard to his wounds. Since I last saw him he was actually in the hospital where he ended up having a pacemaker put in. Subsequently he tells me that he is actually doing quite well although they were unsure whether they were going to do it due to the fact that he had the wounds on his legs. And then I am glad they did anything seem to be doing well. 09-15-2021 upon evaluation patient's wounds are actually showing signs of improvement. The right side wounds do appear to have some need for sharp debridement today and I Georgina Peer go ahead and proceed with that. I think that if we get the wounds cleaned up he will actually show signs of continued improvement. I am also leaning towards switching to [facility] which I think will be a much better option for him. 09-22-2021 patient's wounds are showing signs of excellent improvement. I am actually extremely pleased with where we stand and I think that the patient is making great progress. There does not appear to be any signs of active infection. 09-29-2021 upon evaluation today patient appears to be doing excellent in regard to  his wounds. He is actually tolerating the dressing changes without complication. Fortunately I see no evidence of active infection locally or systemically at this time which is great news and overall I am extremely pleased with where we stand currently. 09-2721 upon evaluation today patient actually appears to be doing excellent in regard to his wounds. The left leg is almost completely healed the right ankle is significantly smaller. Overall I am extremely pleased with where we stand at this point. I do not see any evidence of active infection at this time. 10-13-2021 upon evaluation today patient appears to be doing excellent in regard to his wounds. I really feel like he is making good progress  here and I am very pleased in that regard. Fortunately I do not see any signs of active infection at this time. We are using the [facility] topical antibiotic therapy. 10-20-2021 upon evaluation today patient appears to be doing well with regard to his wound on the right medial ankle region the left leg is almost completely healed. I am actually very pleased with where we stand today. 11-03-2021 upon evaluation today patient's wound actually is going require some sharp debridement but appears to be doing much better which is great news. Fortunately I do not see any signs of active infection at this time. 11-10-2021 upon evaluation today patient's wound is actually showing signs of excellent improvement. Fortunately I do not see any evidence of infection locally or systemically which is great news and overall I am extremely pleased with where we stand today. I do believe he is making good progress he does have his Keystone topical antibiotics with him here today. 11-24-2021 upon evaluation today patient actually showing signs of excellent improvement this appears to be doing much better. Fortunately I do not see any evidence of infection locally or systemically at this time. 12-01-2021 upon evaluation today patient appears to be doing well currently in regard to his wound. He has been tolerating the dressing changes without complication. Fortunately I do not see any evidence of active infection at this time which is great news and overall I am extremely pleased with where we stand today. 12-15-2021 upon evaluation today patient appears to be doing well with regard to his wound. He is showing signs of improvement is slow but nonetheless we are making improvements here. Fortunately I do not see any evidence of active infection locally nor systemically at this time. We are still using the [facility] topical antibiotic over the open area only. LINLEY, MOXLEY ([id number]) [id number]  Page 8 of 10 12-29-2021 upon evaluation today patient's wound actually is showing signs of excellent improvement. It has been 2 weeks since I perform any debridement and it definitely shows he has some tissue that needs to be cleaned away but I think we can do so quite easily and readily today. The good news is I do not see any signs of infection I think he is doing much better in that regard. Overall I am extremely pleased with where we stand. Objective Constitutional Chronically ill appearing but in no apparent acute distress. Vitals Time Taken: 3:04 PM, Height: 71 in, Weight: 350 lbs, BMI: 48.8, Temperature: 98.2 F, Pulse: 102 bpm, Respiratory Rate: 18 breaths/min, Blood Pressure: 144/91 mmHg. Respiratory normal breathing without difficulty. Psychiatric this patient is able to make decisions and demonstrates good insight into disease process. Alert and Oriented x 3. pleasant and cooperative. General Notes: Patient's wound actually is showing signs of good improvement I did have to perform some  debridement of clearway the necrotic debris he tolerated that today without complication and postdebridement the wound bed appears to be doing much better which is awesome news. Integumentary (Hair, Skin) Wound #7 status is Open. Original cause of wound was Blister. The date acquired was: 07/21/2021. The wound has been in treatment 21 weeks. The wound is located on the Right,Medial Malleolus. The wound measures 3.8cm length x 2cm width x 0.2cm depth; 5.969cm^2 area and 1.194cm^3 volume. There is no tunneling or undermining noted. There is a medium amount of serosanguineous drainage noted. There is medium (34-66%) granulation within the wound bed. There is a medium (34-66%) amount of necrotic tissue within the wound bed. The periwound skin appearance did not exhibit: Callus, Crepitus, Excoriation, Induration, Rash, Scarring, Dry/Scaly, Maceration, Atrophie Blanche, Cyanosis, Ecchymosis, Hemosiderin  Staining, Mottled, Pallor, Rubor, Erythema. 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 There was a Excisional Skin/Subcutaneous Tissue Debridement with a total area of 7.6 sq cm performed by [name], PA. With the following instrument(s): Curette to remove Viable and Non-Viable tissue/material. Material removed includes Subcutaneous Tissue and Slough and after achieving pain control using Lidocaine. No specimens were taken. A time out was conducted at 16:00, prior to the start of the procedure. A Minimum amount of bleeding was controlled with Pressure. The procedure was tolerated well with a pain level of 0 throughout and a pain level of 0 following the procedure. Post Debridement Measurements: 3.8cm length x 2cm width x 0.2cm depth; 1.194cm^3 volume. Character of Wound/Ulcer Post Debridement is improved. Severity of Tissue Post Debridement is: Fat layer exposed. Post procedure Diagnosis Wound #7: Same as Pre-Procedure Pre-procedure diagnosis of Wound #7 is a Venous Leg Ulcer located on the Right,Medial Malleolus . There was a Three Layer Compression Therapy Procedure by Rhae Hammock, RN. Post procedure Diagnosis Wound #7: Same as Pre-Procedure Plan Follow-up Appointments: Return Appointment in 2 weeks. Margarita Grizzle on Wednesday Nurse Visit: - next Wednesday 01/05/22 Other: - Hold Keystone  for now 12/29/21 TANUSH, DREES ([id number]) [id number] Page 9 of 10 Anesthetic: (In clinic) Topical Lidocaine 5% applied to wound bed Bathing/ Shower/ Hygiene: May shower with protection but do not get wound dressing(s) wet. Edema Control - Lymphedema / SCD / Other: Elevate legs to the level of the heart or above for 30 minutes daily and/or when sitting, a frequency of: - 3-4 times a day throughout the day. Avoid standing for long periods of time. Exercise regularly Moisturize legs daily. - apply every night before bed to left leg. Compression stocking or Garment 30-40 mm/Hg pressure to: - wear your VIVE compression stocking to left leg. apply in the morning and remove at night. Will apply tubigrip size [name] to left leg. Remove once you get home and use your compression stockings. WOUND #7: - Malleolus Wound Laterality: Right, Medial Peri-Wound Care: Triamcinolone 15 (g) 1 x Per Week/30 Days Discharge Instructions: Use triamcinolone 15 (g) as directed Peri-Wound Care: Sween Lotion (Moisturizing lotion) 1 x Per Week/30 Days  Discharge Instructions: Apply moisturizing lotion as directed Prim Dressing: Promogran Prisma Matrix, 4.34 (sq in) (silver collagen) 1 x Per Week/30 Days ary Discharge Instructions: Moisten collagen with saline or hydrogel Secondary Dressing: Zetuvit Plus 4x8 in 1 x Per Week/30 Days Discharge Instructions: Apply over primary dressing as directed. Com pression Wrap: ThreePress (3 layer compression wrap) 1 x Per Week/30 Days Discharge Instructions: Apply three layer use kerlix instead of cotton compression as directed. 1. I am going to recommend that we have the patient continue to monitor for any signs of infection or worsening. Obviously if anything changes he knows he can contact the office and let me know. 2. I am also going to recommend that the patient should continue to utilize the compression wrapping I do feel like this is doing a good job  for him currently. He is in agreement with that plan. 3. Will continue with the silver collagen which I think is doing a good job. We will see patient back for reevaluation in 1 week here in the clinic. If anything worsens or changes patient will contact our office for additional recommendations. This will be for nurse visit and then I will see him in 2 weeks for provider visit. Electronic Signature(s) Signed: 12/29/2021 6:20:17 PM By: [name] PA-C Entered By: [name] on 12/29/2021 18:20:17 -------------------------------------------------------------------------------- SuperBill Details Patient Name: Date of Service: [place], DA [name] 12/29/2021 Medical Record Number: [id number] Patient Account Number: [id number] Date of Birth/Sex: Treating RN: December 27, 1955 (66 y.o. [name], [name] Primary Care Provider: [name] Other Clinician: Referring Provider: Treating Provider/Extender: [name] in Treatment: 21 Diagnosis Coding ICD-10 Codes Code Description I89.0 Lymphedema, not elsewhere classified I87.333 Chronic venous hypertension (idiopathic) with ulcer and inflammation of bilateral lower extremity L97.828 Non-pressure chronic ulcer of other part of left lower leg with other specified severity L97.818 Non-pressure chronic ulcer of other part of right lower leg with other specified severity E11.621 Type 2 diabetes mellitus with foot ulcer L97.518 Non-pressure chronic ulcer of other part of right foot with other specified severity Facility Procedures : 3 CPT4 Code: [id number] Description: [place] - DEB SUBQ TISSUE 20 SQ CM/< ICD-10 Diagnosis Description L97.818 Non-pressure chronic ulcer of other part of right lower leg with other specified Modifier: severity Quantity: 1 Physician Procedures JOON, POHLE ([id number]): CPT4 Code Description [id number] - WC PHYS SUBQ TISS 20 SQ CM ICD-10 Diagnosis Description L97.818 Non-pressure chronic ulcer  of other part of right lower leg with [id number] Page 10 of 10: Quantity Modifier 1 other specified severity Electronic Signature(s) Signed: 12/29/2021 6:21:17 PM By: [name] PA-C Previous Signature: 12/29/2021 5:39:23 PM Version By: Rhae Hammock RN Entered By: [name] on 12/29/2021 18:21:16

## 2021-12-30 NOTE — Progress Notes (Signed)
JOSIAN, LANESE ([id number]) [id number] Page 1 of 6 Visit Report for 12/29/2021 Arrival Information Details Patient Name: Date of Service: [name], [name] 12/29/2021 2:45 PM Medical Record Number: [id number] Patient Account Number: [id number] Date of Birth/Sex: Treating RN: [birth year] (66 y.o. M) Primary Care Hadasa Gasner: [name] Other Clinician: Referring Lazar Tierce: Treating Anniyah Mood/Extender: [name] in Treatment: 21 Visit Information History Since Last Visit Added or deleted any medications: No Patient Arrived: [name] Any new allergies or adverse reactions: No Arrival Time: 15:03 Had a fall or experienced change in No Accompanied By: self activities of daily living that may affect Transfer Assistance: None risk of falls: Patient Identification Verified: Yes Signs or symptoms of abuse/neglect since last visito No Secondary Verification Process Completed: Yes Hospitalized since last visit: No Patient Requires Transmission-Based Precautions: No Implantable device outside of the clinic excluding No Patient Has Alerts: Yes cellular tissue based products placed in the center Patient Alerts: Patient on Blood Thinner since last visit: 01/2021 ABI L 1.13 R 1.06 Has Compression in Place as Prescribed: Yes 01/2021 TBI L amp R 0.8 Pain Present Now: No Electronic Signature(s) Signed: 12/29/2021 5:14:33 PM By: Erenest Blank Entered By: Erenest Blank on 12/29/2021 15:04:10 -------------------------------------------------------------------------------- Compression Therapy Details Patient Name: Date of Service: [name]. 12/29/2021 2:45 PM Medical Record Number: [id number] Patient Account Number: [id number] Date of Birth/Sex: Treating RN: 1955-09-21 (66 y.o. [name] Noe Primary Care Shay Bartoli: [name] Other Clinician: Referring Corliss Lamartina: Treating Gabryel Talamo/Extender: [name] in  Treatment: 21 Compression Therapy Performed for Wound Assessment: Wound #7 Right,Medial Malleolus Performed By: Clinician Rhae Hammock, RN Compression Type: Three Layer Post Procedure Diagnosis Same as Pre-procedure Electronic Signature(s) Signed: 12/29/2021 5:39:23 PM By: Rhae Hammock RN Entered By: Rhae Hammock on 12/29/2021 16:04:00 Pinkham, Konnar [name] ([id number]) [id number] Page 2 of 6 -------------------------------------------------------------------------------- Encounter Discharge Information Details Patient Name: Date of Service: JAHKAI, YANDELL 12/29/2021 2:45 PM Medical Record Number: [id number] Patient Account Number: [id number] Date of Birth/Sex: Treating RN: 02/26/1955 (66 y.o. [name], [name] Primary Care Lucerito Rosinski: [name] Other Clinician: Referring Jeb Schloemer: Treating Helane Briceno/Extender: [name] in Treatment: 21 Encounter Discharge Information Items Post Procedure Vitals Discharge Condition: Stable Temperature (F): 98.7 Ambulatory Status: Walker Pulse (bpm): 74 Discharge Destination: Home Respiratory Rate (breaths/min): 17 Transportation: Private Auto Blood Pressure (mmHg): 134/74 Accompanied By: self Schedule Follow-up Appointment: Yes Clinical Summary of Care: Patient Declined Electronic Signature(s) Signed: 12/29/2021 5:39:23 PM By: Rhae Hammock RN Entered By: Rhae Hammock on 12/29/2021 16:06:05 -------------------------------------------------------------------------------- Lower Extremity Assessment Details Patient Name: Date of Service: [name], [name] 12/29/2021 2:45 PM Medical Record Number: [id number] Patient Account Number: [id number] Date of Birth/Sex: Treating RN: 01-03-1956 (66 y.o. M) Primary Care Solangel Mcmanaway: [name] Other Clinician: Referring Kadia Abaya: Treating Renato Spellman/Extender: [name] in Treatment: 21 Edema  Assessment Assessed: [Left: No] [name]: No] Edema: [Left: N] [Right: o] Calf Left: Right: Point of Measurement: 31 cm From Medial Instep 40 cm Ankle Left: Right: Point of Measurement: 11 cm From Medial Instep 26.5 cm Electronic Signature(s) Signed: 12/29/2021 5:14:33 PM By: Erenest Blank Entered By: Erenest Blank on 12/29/2021 15:13:55 -------------------------------------------------------------------------------- Multi-Disciplinary Care Plan Details Patient Name: Date of Service: [name]. 12/29/2021 2:45 PM Medical Record Number: [id number] Patient Account Number: [id number] Date of Birth/Sex: Treating RN: July 31, 1955 (66 y.o. [name], [name] Primary Care Floraine Buechler: [name] Other Clinician: Referring Audryna Wendt: Treating Britain Saber/Extender: [name] in  Treatment: [name] [name] ([id number]) [id number] Page 3 of 6 Multidisciplinary Care Plan reviewed with physician Active Inactive Pain, Acute or Chronic Nursing Diagnoses: Pain, acute or chronic: actual or potential Potential alteration in comfort, pain Goals: Patient will verbalize adequate pain control and receive pain control interventions during procedures as needed Date Initiated: 08/04/2021 Target Resolution Date: 01/08/2022 Goal Status: Active Patient/caregiver will verbalize comfort level met Date Initiated: 08/04/2021 Target Resolution Date: 01/08/2022 Goal Status: Active Interventions: Encourage patient to take pain medications as prescribed Provide education on pain management Reposition patient for comfort Treatment Activities: Administer pain control measures as ordered : 08/04/2021 Notes: Electronic Signature(s) Signed: 12/29/2021 5:39:23 PM By: Rhae Hammock RN Entered By: Rhae Hammock on 12/29/2021 16:05:05 -------------------------------------------------------------------------------- Pain Assessment Details Patient Name:  Date of Service: [name], [name] 12/29/2021 2:45 PM Medical Record Number: [id number] Patient Account Number: [id number] Date of Birth/Sex: Treating RN: 04-06-1955 (66 y.o. M) Primary Care Kymere Fullington: [name] Other Clinician: Referring Remmy Crass: Treating Jaylon Boylen/Extender: [name] in Treatment: 21 Active Problems Location of Pain Severity and Description of Pain Patient Has Paino No Site Locations Pain Management and Medication ZYSHONNE, [name] ([id number]) [id number] Page 4 of 6 Current Pain Management: Electronic Signature(s) Signed: 12/29/2021 5:14:33 PM By: Erenest Blank Entered By: Erenest Blank on 12/29/2021 15:04:51 -------------------------------------------------------------------------------- Patient/Caregiver Education Details Patient Name: Date of Service: [name]. 12/20/2023andnbsp2:45 PM Medical Record Number: [id number] Patient Account Number: [id number] Date of Birth/Gender: Treating RN: July 13, 1955 (66 y.o. [name] Noe Primary Care Physician: [name] Other Clinician: Referring Physician: Treating Physician/Extender: [name] in Treatment: 21 Education Assessment Education Provided To: Patient Education Topics Provided Wound/Skin Impairment: Methods: Explain/Verbal Responses: Reinforcements needed, State content correctly [employer]) Signed: 12/29/2021 5:39:23 PM By: Rhae Hammock RN Entered By: Rhae Hammock on 12/29/2021 16:05:19 -------------------------------------------------------------------------------- Wound Assessment Details Patient Name: Date of Service: [name], [name] 12/29/2021 2:45 PM Medical Record Number: [id number] Patient Account Number: [id number] Date of Birth/Sex: Treating RN: 1955/09/01 (66 y.o. M) Primary Care Antionio Negron: [name] Other Clinician: Referring Lashaye Fisk: Treating Artavius Stearns/Extender: [name] in Treatment: 21 Wound Status Wound Number: 7 Primary Venous Leg Ulcer Etiology: Wound Location: Right, Medial Malleolus Secondary Lymphedema Wounding Event: Blister Etiology: Date Acquired: 07/21/2021 Wound Open Weeks Of Treatment: 21 Status: Clustered Wound: No Comorbid Sleep Apnea, Arrhythmia, Congestive Heart Failure, History: Hypertension, Hypotension, Peripheral Arterial Disease, Peripheral Venous Disease, Type II Diabetes, Gout, Osteoarthritis Photos [name], [name] ([id number]) [id number] Page 5 of 6 Wound Measurements Length: (cm) 3.8 Width: (cm) 2 Depth: (cm) 0.2 Area: (cm) 5.969 Volume: (cm) 1.194 % Reduction in Area: 84.4% % Reduction in Volume: 95.5% Epithelialization: Small (1-33%) Tunneling: No Undermining: No Wound Description Classification: Full Thickness With Exposed Suppor Exudate Amount: Medium Exudate Type: Serosanguineous Exudate Color: red, brown t Structures Wound Bed Granulation Amount: Medium (34-66%) Necrotic Amount: Medium (34-66%) Periwound Skin Texture Texture Color No Abnormalities Noted: No No Abnormalities Noted: No Callus: No Atrophie Blanche: No Crepitus: No Cyanosis: No Excoriation: No Ecchymosis: No Induration: No Erythema: No Rash: No Hemosiderin Staining: No Scarring: No Mottled: No Pallor: No Moisture Rubor: No No Abnormalities Noted: No Dry / Scaly: No Maceration: No Treatment Notes Wound #7 (Malleolus) Wound Laterality: Right, Medial Cleanser Peri-Wound Care Triamcinolone 15 (g) Discharge Instruction: Use triamcinolone 15 (g) as directed Sween Lotion (Moisturizing lotion) Discharge Instruction: Apply moisturizing lotion as directed Topical Primary Dressing Promogran Prisma Matrix, 4.34 (sq in) (silver collagen) Discharge  Instruction: Moisten collagen with saline or hydrogel Secondary Dressing Zetuvit Plus 4x8 in Discharge Instruction: Apply over  primary dressing as directed. Secured With Compression Wrap ThreePress (3 layer compression wrap) Discharge Instruction: Apply three layer use kerlix instead of cotton compression as directed. Compression Stockings [name], [name] ([id number]) [id number] Page 6 of 6 Add-Ons Electronic Signature(s) Signed: 12/29/2021 5:14:33 PM By: Erenest Blank Entered By: Erenest Blank on 12/29/2021 15:14:51 -------------------------------------------------------------------------------- Vitals Details Patient Name: Date of Service: [name], [name] 12/29/2021 2:45 PM Medical Record Number: [id number] Patient Account Number: [id number] Date of Birth/Sex: Treating RN: 05-Aug-1955 (66 y.o. M) Primary Care Quanell Loughney: [name] Other Clinician: Referring Kimberlye Dilger: Treating Trevious Rampey/Extender: [name] in Treatment: 21 Vital Signs Time Taken: 15:04 Temperature (F): 98.2 Height (in): 71 Pulse (bpm): 102 Weight (lbs): 350 Respiratory Rate (breaths/min): 18 Body Mass Index (BMI): 48.8 Blood Pressure (mmHg): 144/91 Reference Range: 80 - 120 mg / dl Electronic Signature(s) Signed: 12/29/2021 5:14:33 PM By: Erenest Blank Entered By: Erenest Blank on 12/29/2021 15:04:32

## 2022-01-04 ENCOUNTER — Encounter (HOSPITAL_COMMUNITY): Payer: Self-pay | Admitting: Internal Medicine

## 2022-01-04 ENCOUNTER — Ambulatory Visit (HOSPITAL_BASED_OUTPATIENT_CLINIC_OR_DEPARTMENT_OTHER)
Admission: RE | Admit: 2022-01-04 | Discharge: 2022-01-04 | Disposition: A | Discharge status: Home / Self Care | Payer: Medicare Other | Source: Ambulatory Visit | Attending: Internal Medicine | Admitting: Internal Medicine

## 2022-01-04 ENCOUNTER — Ambulatory Visit (HOSPITAL_COMMUNITY)
Admission: RE | Admit: 2022-01-04 | Discharge: 2022-01-04 | Disposition: A | Discharge status: Home / Self Care | Payer: Medicare Other | Source: Ambulatory Visit | Attending: Internal Medicine | Admitting: Internal Medicine

## 2022-01-04 VITALS — BP 132/88 | HR 84 | Wt 328.4 lb

## 2022-01-04 DIAGNOSIS — I4891 Unspecified atrial fibrillation: Secondary | ICD-10-CM | POA: Insufficient documentation

## 2022-01-04 DIAGNOSIS — I2510 Atherosclerotic heart disease of native coronary artery without angina pectoris: Secondary | ICD-10-CM | POA: Insufficient documentation

## 2022-01-04 DIAGNOSIS — I4510 Unspecified right bundle-branch block: Secondary | ICD-10-CM | POA: Diagnosis not present

## 2022-01-04 DIAGNOSIS — I110 Hypertensive heart disease with heart failure: Secondary | ICD-10-CM | POA: Diagnosis not present

## 2022-01-04 DIAGNOSIS — Z8572 Personal history of non-Hodgkin lymphomas: Secondary | ICD-10-CM | POA: Diagnosis not present

## 2022-01-04 DIAGNOSIS — G4733 Obstructive sleep apnea (adult) (pediatric): Secondary | ICD-10-CM | POA: Insufficient documentation

## 2022-01-04 DIAGNOSIS — Z7982 Long term (current) use of aspirin: Secondary | ICD-10-CM | POA: Diagnosis not present

## 2022-01-04 DIAGNOSIS — I4821 Permanent atrial fibrillation: Secondary | ICD-10-CM

## 2022-01-04 DIAGNOSIS — I5082 Biventricular heart failure: Secondary | ICD-10-CM | POA: Insufficient documentation

## 2022-01-04 DIAGNOSIS — E785 Hyperlipidemia, unspecified: Secondary | ICD-10-CM | POA: Diagnosis not present

## 2022-01-04 DIAGNOSIS — I5042 Chronic combined systolic (congestive) and diastolic (congestive) heart failure: Secondary | ICD-10-CM | POA: Insufficient documentation

## 2022-01-04 DIAGNOSIS — Z7901 Long term (current) use of anticoagulants: Secondary | ICD-10-CM | POA: Diagnosis not present

## 2022-01-04 DIAGNOSIS — Z9221 Personal history of antineoplastic chemotherapy: Secondary | ICD-10-CM | POA: Diagnosis not present

## 2022-01-04 DIAGNOSIS — I5022 Chronic systolic (congestive) heart failure: Secondary | ICD-10-CM | POA: Diagnosis not present

## 2022-01-04 DIAGNOSIS — J849 Interstitial pulmonary disease, unspecified: Secondary | ICD-10-CM | POA: Diagnosis not present

## 2022-01-04 DIAGNOSIS — Z6841 Body Mass Index (BMI) 40.0 and over, adult: Secondary | ICD-10-CM | POA: Diagnosis not present

## 2022-01-04 DIAGNOSIS — I272 Pulmonary hypertension, unspecified: Secondary | ICD-10-CM | POA: Diagnosis not present

## 2022-01-04 LAB — CUP PACEART REMOTE DEVICE CHECK
Battery Remaining Longevity: 73 mo
Battery Voltage: 3.05 V
Brady Statistic AP VP Percent: 95.63 %
Brady Statistic AP VS Percent: 0 %
Brady Statistic AS VP Percent: 3.82 %
Brady Statistic AS VS Percent: 0.55 %
Brady Statistic RA Percent Paced: 63.77 %
Brady Statistic RV Percent Paced: 63.57 %
Date Time Interrogation Session: 20231226044222
HighPow Impedance: 64 Ohm
Implantable Lead Connection Status: 753985
Implantable Lead Connection Status: 753985
Implantable Lead Implant Date: 20230823
Implantable Lead Implant Date: 20230823
Implantable Lead Location: 753859
Implantable Lead Location: 753860
Implantable Lead Model: 3830
Implantable Lead Model: 6935
Implantable Pulse Generator Implant Date: 20230823
Lead Channel Impedance Value: 266 Ohm
Lead Channel Impedance Value: 380 Ohm
Lead Channel Impedance Value: 399 Ohm
Lead Channel Sensing Intrinsic Amplitude: 13.375 mV
Lead Channel Sensing Intrinsic Amplitude: 13.375 mV
Lead Channel Sensing Intrinsic Amplitude: 5.875 mV
Lead Channel Sensing Intrinsic Amplitude: 5.875 mV
Lead Channel Setting Pacing Amplitude: 1.5 V
Lead Channel Setting Pacing Amplitude: 3.5 V
Lead Channel Setting Pacing Pulse Width: 0.4 ms
Lead Channel Setting Sensing Sensitivity: 0.3 mV
Zone Setting Status: 755011

## 2022-01-04 LAB — ECHOCARDIOGRAM COMPLETE
Area-P 1/2: 4.8 cm2
Calc EF: 27.3 %
MV M vel: 5.28 m/s
MV Peak grad: 111.3 mmHg
Radius: 0.4 cm
S' Lateral: 4.9 cm
Single Plane A2C EF: 32.5 %
Single Plane A4C EF: 23.4 %

## 2022-01-04 MED ORDER — CARVEDILOL 3.125 MG PO TABS: 3.1250 mg | ORAL_TABLET | Freq: Two times a day (BID) | ORAL | 3 refills | Status: DC

## 2022-01-04 NOTE — Patient Instructions (Signed)
START Carvedilol 3.125 mg Twice daily  TAKE  '40mg'$  daily for 3 days, then back to as needed.  [place] AT [phone]  Your physician recommends that you schedule a follow-up appointment in: 4 MONTHS  If you have any questions or concerns before your next appointment please send [place] a message through [place] or call our office at [phone].    TO LEAVE A MESSAGE FOR THE NURSE SELECT OPTION 2, PLEASE LEAVE A MESSAGE INCLUDING: YOUR NAME DATE OF BIRTH CALL BACK NUMBER REASON FOR CALL**this is important as we prioritize the call backs  YOU WILL RECEIVE A CALL BACK THE SAME DAY AS LONG AS YOU CALL BEFORE 4:00 PM  At the [facility], you and your health needs are our priority. As part of our continuing mission to provide you with exceptional heart care, we have created designated Provider Care Teams. These Care Teams include your primary Cardiologist (physician) and Advanced Practice Providers (APPs- Physician Assistants and Nurse Practitioners) who all work together to provide you with the care you need, when you need it.   You may see any of the following providers on your designated Care Team at your next follow up: Dr Glori Bickers Dr Loralie Champagne Dr. Roxana Hires, NP Lyda Jester, [place] [facility] [place], [place] Forestine Na, NP Audry Riles, PharmD   Please be sure to bring in all your medications bottles to every appointment.

## 2022-01-04 NOTE — Progress Notes (Signed)
-  ADVANCED HF CLINIC NOTE   PCP: Ginger Organ., MD HF Cardiologist: Dr. Haroldine Laws  HPI: [name] is a 66 y.o.y/o male w/ h/o chronic systolic heart failure, chronic afib, OSA, HTN, HLD, type 2DM, OSA on CPAP ,ILD, tobacco use and venous stasis ulcers, followed by wound clinic. Also history of chemotherapy with potential cardiotoxic effects (R-CHOP for lymphoma in 2011).    Admitted in 02/2017 for acute hypoxic respiratory failure requiring intubation and ultimately tracheostomy. This was in the setting of multifocal PNA/ influenza and new onset atrial fibrillation w/ RVR. Echo showed mod LVH and severely reduced LVEF 25-30% w/ diffuse HK. RV normal. At the time, his CM was felt to be viral. Did not get cath. Per d/c summary, DCCV not pursued. Afib treated w/ rate control and Eliquis.    Repeat echo 07/2017 EF improved to 50-55%, RV mildly dilated w/ mildly reduced systolic function. He was noted to be in afib at the time of study.    08/2017, wore Zio that showed Afib, well rated controlled.    Echo 11/2018 EF 45-50%, RV mildly reduced    Unfortunately, lost to f/u by cardiology for a period of time. Had been seeing [facility] for PAD, s/p left transmetatarsal amputation.  Echo 8/23 showed EF 30-35%, severe LVH, moderately reduced RV systolic function, moderate TVR.    Admitted 8/23 with pre-syncope, ECG showed AF w/ SVR, HR 28 BPM, RBBB, had several 2 sec pauses. Beta blocker and other GDMT held with AKI and hypotension. He developed worsening periods of high-grade AV block/atrial fibrillation with slow ventricular response and lengthy pauses w/ hypotension. Underwent TVP with improvement in symptoms. R/LHC showed diffuse nonobstructive CAD, bi-V failure R>L, mild to moderate pulmonary venous hypertension. EP consulted and he underwent BiV ICD. Hospitalization complicated by chronic venous stasis wounds. Able to transition to GDMT as AKI resolved. Discharged home with [facility] PT/OT, weight 328  lbs.  Returns for f/u with his wife. Was on prednisone x 1 month for his lung disease - stopped last week. Has taken a few doses of torsemide without benefit. Wound almost healed. Breathing better. Gets around with walker. No orthopnea or PND.    Echo today 01/04/22: EF 30% RV moderately down     Cardiac Studies - PYP (10/23): equivocal grade 1, H/CL = 1.06  - Echo (8/23): EF 30-35%, moderately decreased LV function, RV moderate reduced, moderate TR  - R/LHC (8/23):   Mid RCA lesion is 40% stenosed.   RPDA lesion is 95% stenosed.   Ost LM lesion is 30% stenosed.   Mid LAD lesion is 30% stenosed.   2nd Diag lesion is 50% stenosed.   Mid Cx lesion is 30% stenosed.   Ao = 109/58 (78) LV = 119/19 RA =  21 RV = 57/19 PA = 56/22 (36) PCW = 24 (v = 30) Fick cardiac output/index = 7.3/2.7 PVR = 1.8 WU Ao sat = 99% PA sat = 68%, 69% PAPi = 2.1   1. Mostly non-obstructive CAD with high-grade lesion in mid to distal RPDA 2. Nonischemic CM  3. Mild to moderate pulmonary venous HTN with evidence of RV dysfunction   Plan/Discussion: Continue medical therapy. If patient develops angina can consider PCI of PDA; otherwise medical therapy.    Past Medical History:  Diagnosis Date   Acute systolic HF (heart failure) (HCC)    Arthritis    lt foot, hips knees    Atrial fibrillation (HCC)    Diabetes mellitus,  new onset ([place])    Diverticulosis    Dyslipidemia    Gout attack 01/16/2012   Hypertension    Internal hemorrhoids    Lymphoma ([place])    remission for about 2 years, chemo 3 years prior   Pneumonia due to COVID-19 virus 07/2018   Pulmonary hypertension ([place])    Sepsis ([place]) 02/2017   secondary to influenza; requiring trach   Tubular adenoma of colon    Venous stasis ulcers (HCC)     Current Outpatient Medications  Medication Sig Dispense Refill   acetaminophen (TYLENOL) 325 MG tablet Take 650 mg by mouth every 6 (six) hours as needed for moderate pain.      ascorbic  acid (VITAMIN C) 500 MG tablet Take 1 tablet (500 mg total) by mouth 3 (three) times daily. (Patient taking differently: Take 500 mg by mouth 2 (two) times daily.) 30 tablet 0   aspirin 81 MG chewable tablet Chew 1 tablet (81 mg total) by mouth daily. 30 tablet 11   atorvastatin (LIPITOR) 40 MG tablet TAKE 1 TABLET BY MOUTH EVERY DAY 30 tablet 11   cholecalciferol (VITAMIN D) 25 MCG (1000 UNIT) tablet Take 1 tablet (1,000 Units total) by mouth daily. 30 tablet 0   colchicine 0.6 MG tablet Take 0.6 mg by mouth 2 (two) times daily as needed.     Continuous Blood Gluc Receiver (FREESTYLE LIBRE 2 READER) DEVI 1 each by Does not apply route daily. 1 each 3   Continuous Blood Gluc Sensor (FREESTYLE LIBRE 2 SENSOR) MISC 1 each by Does not apply route daily. 1 each 3   dapagliflozin propanediol (FARXIGA) 10 MG TABS tablet Take 1 tablet (10 mg total) by mouth daily. 30 tablet 11   ELIQUIS 5 MG TABS tablet TAKE 1 TABLET BY MOUTH TWICE A DAY 180 tablet 1   Omega 3 340 MG CPDR Take 1 capsule (340 mg total) by mouth 2 (two) times daily. (Patient taking differently: Take 340 mg by mouth daily.) 60 capsule 0   omeprazole (PRILOSEC) 40 MG capsule Take 1 capsule (40 mg total) by mouth daily. 30 capsule 0   sacubitril-valsartan (ENTRESTO) 97-103 MG Take 1 tablet by mouth 2 (two) times daily. 60 tablet 11   sertraline (ZOLOFT) 50 MG tablet Take 1 tablet (50 mg total) by mouth daily. 30 tablet 0   spironolactone (ALDACTONE) 25 MG tablet Take 1 tablet (25 mg total) by mouth daily. 30 tablet 11   torsemide (DEMADEX) 20 MG tablet Take 1 tablet (20 mg total) by mouth as needed. 30 tablet 6   vitamin B-12 (CYANOCOBALAMIN) 1000 MCG tablet Take 1 tablet (1,000 mcg total) by mouth daily. 30 tablet 0   polycarbophil (FIBERCON) 625 MG tablet Take 1 tablet (625 mg total) by mouth daily. (Patient not taking: Reported on 01/04/2022) 30 tablet 0   Tirzepatide (MOUNJARO [place]) Inject 1 Dose into the skin once a week. (Patient not  taking: Reported on 11/17/2021)     No current facility-administered medications for this encounter.   Allergies  Allergen Reactions   Latex Hives and Swelling   Ace Inhibitors     Other reaction(s): cough   Penicillin G     Other reaction(s): rash Has tolerated amoxicillin   Social History   Socioeconomic History   Marital status: Divorced    Spouse name: Not on file   Number of children: Not on file   Years of education: Not on file   Highest education level: Not on file  Occupational History   Occupation: cosmetologist  Tobacco Use   Smoking status: Former    Packs/day: 0.50    Years: 20.00    Total pack years: 10.00    Types: Cigarettes    Quit date: 01/10/2009    Years since quitting: 12.9   Smokeless tobacco: Never  Vaping Use   Vaping Use: Never used  Substance and Sexual Activity   Alcohol use: Not Currently    Comment: 2-3 times per week   Drug use: No   Sexual activity: Not on file  Other Topics Concern   Not on file  Social History Narrative   Not on file   Social Determinants of Health   Financial Resource Strain: Not on file  Food Insecurity: No Food Insecurity (09/20/2021)   Hunger Vital Sign    Worried About Running Out of Food in the Last Year: Never true    Ran Out of Food in the Last Year: Never true  Transportation Needs: No Transportation Needs (09/20/2021)   PRAPARE - Hydrologist (Medical): No    Lack of Transportation (Non-Medical): No  Physical Activity: Not on file  Stress: Not on file  Social Connections: Not on file  Intimate Partner Violence: Not on file   Family History  Problem Relation Age of Onset   Dementia Brother 61       frontal lobe    Dementia Mother    Arthritis Mother    Hypertension Mother    CVA Sister 27   Colon cancer Neg Hx    Pancreatic cancer Neg Hx    Stomach cancer Neg Hx    Rectal cancer Neg Hx    Liver cancer Neg Hx    BP 132/88   Pulse 84   Wt (!) 149 kg (328 lb 6.4  oz)   SpO2 94%   BMI 47.12 kg/m   Wt Readings from Last 3 Encounters:  01/04/22 (!) 149 kg (328 lb 6.4 oz)  11/22/21 (!) 140.2 kg (309 lb)  11/17/21 (!) 140.6 kg (310 lb)   PHYSICAL EXAM: General:  Obese male No resp difficulty HEENT: normal Neck: supple. JVP 8-9  Carotids 2+ bilat; no bruits. No lymphadenopathy or thryomegaly appreciated. Cor: PMI nondisplaced. Regular rate & rhythm. No rubs, gallops or murmurs. Lungs: clear Abdomen: soft, nontender, nondistended. No hepatosplenomegaly. No bruits or masses. Good bowel sounds. Extremities: no cyanosis, clubbing, rash, 2+ edema RLE wrapped  Neuro: alert & orientedx3, cranial nerves grossly intact. moves all 4 extremities w/o difficulty. Affect pleasant   MDT device interrogation (personally reviewed): OptiVol up. 100% bivICD No VT/AF Activity 1.1 hr/day  ASSESSMENT & PLAN: Chronic Systolic Biventricular Heart Failure - Echo (2/19): EF 25-30% w/ diffuse HK. RV normal. Felt to be viral CM +/- component of tachymediated in setting of influenza PNA and rapid Afib  - Echo (7/19): EF recovered, 50-55%, RV mildly reduced - Echo (11/20): EF 45-50%, RV mildly reduced (in rate controlled afib)  - Echo (8/23): EF 30-35%, severe LVH, moderately reduced RV systolic function, moderate TR - R/LHC (8/23): diffuse non-obstructive CAD with 90% lesion in dLAD, BiV failure R>L: RA 21 PA 56/22 (36) PCW 24 Fick 7.3/2.7 PaPi 2.1 - Myeloma panel and urine immunofixation negative.  - PYP scan 10/23 equivocal. Resent Genetic testing. - s/p CRT-D  - Echo today 01/04/22: EF 30% RV moderately down  - Stable NYHA II-III, confounded by body habitus and LE wounds. Volume up today, weight up 18 lbs -  Take torsemide 40 daily x 3 days and reassess - Continue Entresto to 97/103 mg bid. - Continue spiro 25 mg daily. - Continue Farxiga 10 mg daily.  -  Resume carvedilol 3.125 bid    2. Profound Bradycardia w/ High-grade AV block - s/p BiV ICD - interrogation as  above   3. Chronic Afib - Dates back to at least 2019, has been historically well rate controlled - Continue CPAP.  - Continue Eliquis. No bleeding issues.   4. PAD/ Chronic Venous Stasis Wounds  - s/p left transmetatarsal amputation - Followed by [facility]VS and Wound clinic  5. H/o Lymphoma - s/p R-CHOP in 2011 (potential cardiotoxic effects)    6. Type 2DM - Hgb A1c 6.0 - On SGLT2i    7. CAD - LHC (8/23) diffuse mostly non-obstructive CAD, not cause of CM - Continue medical management with ASA/statin. - No s/s angina - If has angina can consider PCI PDA   8. Morbid obesity - Body mass index is 47.12 kg/m. - Needs weight loss. - On Mounjaro   9. ILD - Hi rest chest CT nonspecific, but ? NSIP vs chronic hypersensitivity pneumonitis - Followed by Pulmonary   Glori Bickers, MD  2:21 PM

## 2022-01-04 NOTE — Progress Notes (Signed)
  Echocardiogram 2D Echocardiogram has been performed.  [name] 01/04/2022, 1:51 PM

## 2022-01-05 ENCOUNTER — Encounter (HOSPITAL_BASED_OUTPATIENT_CLINIC_OR_DEPARTMENT_OTHER): Payer: Medicare Other | Admitting: General Surgery

## 2022-01-05 DIAGNOSIS — M1990 Unspecified osteoarthritis, unspecified site: Secondary | ICD-10-CM | POA: Diagnosis not present

## 2022-01-05 DIAGNOSIS — E11621 Type 2 diabetes mellitus with foot ulcer: Secondary | ICD-10-CM | POA: Diagnosis not present

## 2022-01-05 DIAGNOSIS — I87333 Chronic venous hypertension (idiopathic) with ulcer and inflammation of bilateral lower extremity: Secondary | ICD-10-CM | POA: Diagnosis not present

## 2022-01-05 DIAGNOSIS — I509 Heart failure, unspecified: Secondary | ICD-10-CM | POA: Diagnosis not present

## 2022-01-05 DIAGNOSIS — L97818 Non-pressure chronic ulcer of other part of right lower leg with other specified severity: Secondary | ICD-10-CM | POA: Diagnosis not present

## 2022-01-05 DIAGNOSIS — M109 Gout, unspecified: Secondary | ICD-10-CM | POA: Diagnosis not present

## 2022-01-05 DIAGNOSIS — L97828 Non-pressure chronic ulcer of other part of left lower leg with other specified severity: Secondary | ICD-10-CM | POA: Diagnosis not present

## 2022-01-05 DIAGNOSIS — I890 Lymphedema, not elsewhere classified: Secondary | ICD-10-CM | POA: Diagnosis not present

## 2022-01-05 DIAGNOSIS — I872 Venous insufficiency (chronic) (peripheral): Secondary | ICD-10-CM | POA: Diagnosis not present

## 2022-01-05 DIAGNOSIS — Z7901 Long term (current) use of anticoagulants: Secondary | ICD-10-CM | POA: Diagnosis not present

## 2022-01-05 DIAGNOSIS — I110 Hypertensive heart disease with heart failure: Secondary | ICD-10-CM | POA: Diagnosis not present

## 2022-01-05 DIAGNOSIS — L97518 Non-pressure chronic ulcer of other part of right foot with other specified severity: Secondary | ICD-10-CM | POA: Diagnosis not present

## 2022-01-06 DIAGNOSIS — T148XXA Other injury of unspecified body region, initial encounter: Secondary | ICD-10-CM | POA: Diagnosis not present

## 2022-01-06 DIAGNOSIS — L089 Local infection of the skin and subcutaneous tissue, unspecified: Secondary | ICD-10-CM | POA: Diagnosis not present

## 2022-01-06 DIAGNOSIS — R5381 Other malaise: Secondary | ICD-10-CM | POA: Diagnosis not present

## 2022-01-06 DIAGNOSIS — G4733 Obstructive sleep apnea (adult) (pediatric): Secondary | ICD-10-CM | POA: Diagnosis not present

## 2022-01-07 NOTE — Progress Notes (Signed)
ALBI, RAPPAPORT ([id number]) [id number] Page 1 of 1 Visit Report for 01/05/2022 SuperBill Details Patient Name: Date of Service: [name], [name] 01/05/2022 Medical Record Number: [id number] Patient Account Number: [id number] Date of Birth/Sex: Treating RN: 03/06/1955 (66 y.o. [name], [name] Primary Care Provider: [name] Other Clinician: Referring Provider: Treating Provider/Extender: Bethena Roys in Treatment: 22 Diagnosis Coding ICD-10 Codes Code Description I89.0 Lymphedema, not elsewhere classified I87.333 Chronic venous hypertension (idiopathic) with ulcer and inflammation of bilateral lower extremity L97.828 Non-pressure chronic ulcer of other part of left lower leg with other specified severity L97.818 Non-pressure chronic ulcer of other part of right lower leg with other specified severity E11.621 Type 2 diabetes mellitus with foot ulcer L97.518 Non-pressure chronic ulcer of other part of right foot with other specified severity Facility Procedures CPT4 Code Description Modifier Quantity [id number] (Facility Use Only) [phone] - APPLY MULTLAY COMPRS LWR RT LEG 1 Electronic Signature(s) Signed: 01/05/2022 12:13:53 PM By: Fredirick Maudlin MD FACS Signed: 01/06/2022 6:05:29 PM By: Deon Pilling RN, BSN Entered By: Deon Pilling on 01/05/2022 08:36:54

## 2022-01-07 NOTE — Progress Notes (Signed)
MERLAND, HOLNESS ([id number]) [id number] Page 1 of 3 Visit Report for 01/05/2022 Arrival Information Details Patient Name: Date of Service: [name], [name] 01/05/2022 8:45 A M Medical Record Number: [id number] Patient Account Number: [id number] Date of Birth/Sex: Treating RN: [birth year] (66 y.o. [name], [name] Primary Care Byrne Capek: [name] Other Clinician: Referring Kanika Bungert: Treating Adele Milson/Extender: Bethena Roys in Treatment: 86 Visit Information History Since Last Visit Added or deleted any medications: No Patient Arrived: Gilford Rile Any new allergies or adverse reactions: No Arrival Time: 08:24 Had a fall or experienced change in No Accompanied By: self activities of daily living that may affect Transfer Assistance: None risk of falls: Patient Identification Verified: Yes Signs or symptoms of abuse/neglect since last visito No Secondary Verification Process Completed: Yes Hospitalized since last visit: No Patient Requires Transmission-Based Precautions: No Implantable device outside of the clinic excluding No Patient Has Alerts: Yes cellular tissue based products placed in the center Patient Alerts: Patient on Blood Thinner since last visit: 01/2021 ABI L 1.13 R 1.06 Has Dressing in Place as Prescribed: Yes 01/2021 TBI L amp R 0.8 Has Compression in Place as Prescribed: Yes Pain Present Now: No Electronic Signature(s) Signed: 01/06/2022 6:05:29 PM By: Deon Pilling RN, BSN Entered By: Deon Pilling on 01/05/2022 08:35:00 -------------------------------------------------------------------------------- Compression Therapy Details Patient Name: Date of Service: [name], [name] [name] 01/05/2022 8:45 A M Medical Record Number: [id number] Patient Account Number: [id number] Date of Birth/Sex: Treating RN: 1955/10/01 (66 y.o. [name] Primary Care Jesse Hirst: [name] Other Clinician: Referring Yvonne Petite: Treating  Naryah Clenney/Extender: Bethena Roys in Treatment: 22 Compression Therapy Performed for Wound Assessment: Wound #7 Right,Medial Malleolus Performed By: Clinician Deon Pilling, RN Compression Type: Three Hydrologist) Signed: 01/06/2022 6:05:29 PM By: Deon Pilling RN, BSN Entered By: Deon Pilling on 01/05/2022 08:35:30 -------------------------------------------------------------------------------- Encounter Discharge Information Details Patient Name: Date of Service: [name], [name] 01/05/2022 8:45 A M Medical Record Number: [id number] Patient Account Number: [id number] [name], [name] ([id number]) [id number] Page 2 of 3 Date of Birth/Sex: Treating RN: [birth year] (67 y.o. [name] Primary Care Jaelan Rasheed: Other Clinician: [name] Referring Greydon Betke: Treating Olayinka Gathers/Extender: Bethena Roys in Treatment: 58 Encounter Discharge Information Items Discharge Condition: Stable Ambulatory Status: Ambulatory Discharge Destination: Home Transportation: Private Auto Accompanied By: self Schedule Follow-up Appointment: Yes Clinical Summary of Care: Electronic Signature(s) Signed: 01/06/2022 6:05:29 PM By: Deon Pilling RN, BSN Entered By: Deon Pilling on 01/05/2022 08:36:47 -------------------------------------------------------------------------------- Wound Assessment Details Patient Name: Date of Service: [name], [name] 01/05/2022 8:45 A M Medical Record Number: [id number] Patient Account Number: [id number] Date of Birth/Sex: Treating RN: 1955/07/30 (66 y.o. [name], [name] Primary Care Evonna Stoltz: [name] Other Clinician: Referring Fortunata Betty: Treating Marlon Vonruden/Extender: Bethena Roys in Treatment: 22 Wound Status Wound Number: 7 Primary Etiology: Venous Leg Ulcer Wound Location: Right, Medial Malleolus Secondary Etiology: Lymphedema Wounding Event:  Blister Wound Status: Open Date Acquired: 07/21/2021 Weeks Of Treatment: 22 Clustered Wound: No Wound Measurements Length: (cm) 3.8 Width: (cm) 2 Depth: (cm) 0.2 Area: (cm) 5.969 Volume: (cm) 1.194 % Reduction in Area: 84.4% % Reduction in Volume: 95.5% Wound Description Classification: Full Thickness With Exposed Support Exudate Amount: Medium Exudate Type: Serosanguineous Exudate Color: red, brown Structures Periwound Skin Texture Texture Color No Abnormalities Noted: No No Abnormalities Noted: No Moisture No Abnormalities Noted: No Treatment Notes Wound #7 (Malleolus) Wound Laterality: Right, Medial Cleanser Peri-Wound Care Triamcinolone 15 (g) Discharge Instruction: Use triamcinolone 15 (g)  as directed [name] (Moisturizing lotion) [name], [name] ([id number]) [id number] Page 3 of 3 Discharge Instruction: Apply moisturizing lotion as directed Topical Primary Dressing Promogran Prisma Matrix, 4.34 (sq in) (silver collagen) Discharge Instruction: Moisten collagen with saline or hydrogel Secondary Dressing Zetuvit Plus 4x8 in Discharge Instruction: Apply over primary dressing as directed. Secured With Compression Wrap ThreePress (3 layer compression wrap) Discharge Instruction: Apply three layer use kerlix instead of cotton compression as directed. Compression Stockings Add-Ons Electronic Signature(s) Signed: 01/06/2022 6:05:29 PM By: Deon Pilling RN, BSN Entered By: Deon Pilling on 01/05/2022 08:35:15

## 2022-01-10 ENCOUNTER — Other Ambulatory Visit: Payer: Self-pay | Admitting: Pulmonary Disease

## 2022-01-11 LAB — CUP PACEART REMOTE DEVICE CHECK
Battery Remaining Longevity: 75 mo
Battery Voltage: 3.02 V
Brady Statistic AP VP Percent: 91.13 %
Brady Statistic AP VS Percent: 0 %
Brady Statistic AS VP Percent: 8.41 %
Brady Statistic AS VS Percent: 0.46 %
Brady Statistic RA Percent Paced: 79.19 %
Brady Statistic RV Percent Paced: 85.27 %
Date Time Interrogation Session: 20240102002203
HighPow Impedance: 79 Ohm
Implantable Lead Connection Status: 753985
Implantable Lead Connection Status: 753985
Implantable Lead Implant Date: 20230823
Implantable Lead Implant Date: 20230823
Implantable Lead Location: 753859
Implantable Lead Location: 753860
Implantable Lead Model: 3830
Implantable Lead Model: 6935
Implantable Pulse Generator Implant Date: 20230823
Lead Channel Impedance Value: 304 Ohm
Lead Channel Impedance Value: 399 Ohm
Lead Channel Impedance Value: 456 Ohm
Lead Channel Sensing Intrinsic Amplitude: 12.875 mV
Lead Channel Sensing Intrinsic Amplitude: 12.875 mV
Lead Channel Sensing Intrinsic Amplitude: 14.25 mV
Lead Channel Sensing Intrinsic Amplitude: 14.25 mV
Lead Channel Setting Pacing Amplitude: 1.5 V
Lead Channel Setting Pacing Amplitude: 3.5 V
Lead Channel Setting Pacing Pulse Width: 0.4 ms
Lead Channel Setting Sensing Sensitivity: 0.3 mV
Zone Setting Status: 755011

## 2022-01-12 ENCOUNTER — Encounter (HOSPITAL_BASED_OUTPATIENT_CLINIC_OR_DEPARTMENT_OTHER): Payer: Medicare Other | Attending: Physician Assistant | Admitting: Physician Assistant

## 2022-01-12 DIAGNOSIS — L97818 Non-pressure chronic ulcer of other part of right lower leg with other specified severity: Secondary | ICD-10-CM | POA: Diagnosis not present

## 2022-01-12 DIAGNOSIS — L97518 Non-pressure chronic ulcer of other part of right foot with other specified severity: Secondary | ICD-10-CM | POA: Insufficient documentation

## 2022-01-12 DIAGNOSIS — I87333 Chronic venous hypertension (idiopathic) with ulcer and inflammation of bilateral lower extremity: Secondary | ICD-10-CM | POA: Insufficient documentation

## 2022-01-12 DIAGNOSIS — L97828 Non-pressure chronic ulcer of other part of left lower leg with other specified severity: Secondary | ICD-10-CM | POA: Diagnosis not present

## 2022-01-12 DIAGNOSIS — I2510 Atherosclerotic heart disease of native coronary artery without angina pectoris: Secondary | ICD-10-CM | POA: Diagnosis not present

## 2022-01-12 DIAGNOSIS — I110 Hypertensive heart disease with heart failure: Secondary | ICD-10-CM | POA: Insufficient documentation

## 2022-01-12 DIAGNOSIS — I509 Heart failure, unspecified: Secondary | ICD-10-CM | POA: Insufficient documentation

## 2022-01-12 DIAGNOSIS — L97312 Non-pressure chronic ulcer of right ankle with fat layer exposed: Secondary | ICD-10-CM | POA: Diagnosis not present

## 2022-01-12 DIAGNOSIS — E1151 Type 2 diabetes mellitus with diabetic peripheral angiopathy without gangrene: Secondary | ICD-10-CM | POA: Diagnosis not present

## 2022-01-12 DIAGNOSIS — I872 Venous insufficiency (chronic) (peripheral): Secondary | ICD-10-CM | POA: Insufficient documentation

## 2022-01-12 DIAGNOSIS — M109 Gout, unspecified: Secondary | ICD-10-CM | POA: Insufficient documentation

## 2022-01-12 DIAGNOSIS — I890 Lymphedema, not elsewhere classified: Secondary | ICD-10-CM | POA: Diagnosis not present

## 2022-01-12 DIAGNOSIS — E11621 Type 2 diabetes mellitus with foot ulcer: Secondary | ICD-10-CM | POA: Diagnosis not present

## 2022-01-12 DIAGNOSIS — Z7901 Long term (current) use of anticoagulants: Secondary | ICD-10-CM | POA: Diagnosis not present

## 2022-01-12 NOTE — Progress Notes (Signed)
Remote ICD transmission.   

## 2022-01-12 NOTE — Progress Notes (Signed)
SHRIHAN, PUTT ([id number]) [id number] Page 1 of 10 Visit Report for 01/12/2022 Chief Complaint Document Details Patient Name: Date of Service: [name], [name] 01/12/2022 3:15 PM Medical Record Number: [id number] Patient Account Number: [id number] Date of Birth/Sex: Treating RN: [birth year] (67 y.o. M) Primary Care Provider: [name] Other Clinician: Referring Provider: Treating Provider/Extender: [name] in Treatment: 23 Information Obtained from: Patient Chief Complaint 08/04/2021; patient returns to clinic with bilateral leg wounds as well as areas on the right foot Electronic Signature(s) Signed: 01/12/2022 3:45:11 PM By: [name] PA-C Entered By: [name] on 01/12/2022 15:45:10 -------------------------------------------------------------------------------- Debridement Details Patient Name: Date of Service: [name], [name] 01/12/2022 3:15 PM Medical Record Number: [id number] Patient Account Number: [id number] Date of Birth/Sex: Treating RN: [birth year] (67 y.o. [name], [name] Primary Care Provider: [name] Other Clinician: Referring Provider: Treating Provider/Extender: [name] in Treatment: 23 Debridement Performed for Assessment: Wound #7 Right,Medial Malleolus Performed By: Physician [name], PA Debridement Type: Debridement Severity of Tissue Pre Debridement: Fat layer exposed Level of Consciousness (Pre-procedure): Awake and Alert Pre-procedure Verification/Time Out Yes - 15:50 Taken: Start Time: 15:51 Pain Control: Lidocaine 4% T opical Solution T Area Debrided (L x W): otal 4 (cm) x 4 (cm) = 16 (cm) Tissue and other material debrided: Viable, Non-Viable, Slough, Subcutaneous, Skin: Dermis , Skin: Epidermis, Slough Level: Skin/Subcutaneous Tissue Debridement Description: Excisional Instrument: Curette Bleeding: Minimum Hemostasis Achieved: Pressure End  Time: 15:55 Procedural Pain: 0 Post Procedural Pain: 0 Response to Treatment: Procedure was tolerated well Level of Consciousness (Post- Awake and Alert procedure): Post Debridement Measurements of Total Wound Length: (cm) 3.9 Width: (cm) 4 Depth: (cm) 0.2 Volume: (cm) 2.45 Character of Wound/Ulcer Post Debridement: Improved Severity of Tissue Post Debridement: Fat layer exposed ATTICUS, WEDIN D ([id number]) [id number] Page 2 of 10 Post Procedure Diagnosis Same as Pre-procedure Electronic Signature(s) Signed: 01/12/2022 4:16:28 PM By: [name] PA-C Signed: 01/12/2022 5:47:40 PM By: Deon Pilling RN, BSN Entered By: Deon Pilling on 01/12/2022 15:56:21 -------------------------------------------------------------------------------- HPI Details Patient Name: Date of Service: [name], [name] 01/12/2022 3:15 PM Medical Record Number: [id number] Patient Account Number: [id number] Date of Birth/Sex: Treating RN: 07/21/1955 (67 y.o. M) Primary Care Provider: [name] Other Clinician: Referring Provider: Treating Provider/Extender: [name] in Treatment: 23 History of Present Illness HPI Description: ADMISSION 03/22/2021 This is a 67 year old man with a past medical history significant for diabetes type 2, congestive heart failure, peripheral arterial disease, morbid obesity, venous insufficiency, and coronary artery disease. He has been followed by [name] in podiatry, who performed a transmetatarsal amputation on the left foot in August 2022. He had issues healing that wound, but based upon Dr. [name] notes, ultimately the TMA wound healed. During his recovery from that surgery, however, ulcers opened up over the DIP joint of the right second and third toe. These have apparently closed and reopened multiple times. It sounds like one of the issues has been moisture accumulation and maceration of the tissues causing them to  reopen. At his last visit with [name], on March 01, 2021, there continues to be problems with moisture and he was referred to wound care for further evaluation and management. He had a formal aortogram with runoff performed prior to his TMA. The findings are copied here: Patient has inline flow to both feet with no significant flow-limiting lesion that would be amenable to percutaneous or open revascularization.  He does have an element of small vessel disease and has a short segment occlusion of the distal anterior tibial/dorsalis pedis artery on the left foot but does have posterior tibial artery flow. Would recommend management of wounds with amputation of toes 2 and 3 on the right foot if the wounds do not heal and deteriorate. Transmetatarsal amputation on the left side has as good a blood supply as it is going to get and hopefully this will heal in the future. Formal ABIs were done in January 2023. They are normal bilaterally. ABI Findings: +---------+------------------+-----+----------+--------+ Right Rt Pressure (mmHg)IndexWaveform Comment  +---------+------------------+-----+----------+--------+ Brachial 160     +---------+------------------+-----+----------+--------+ PTA 192 1.06 monophasic  +---------+------------------+-----+----------+--------+ DP 159 0.88 monophasic  +---------+------------------+-----+----------+--------+ Great T oe145 0.80    +---------+------------------+-----+----------+--------+ +--------+------------------+-----+---------+-------+ Left Lt Pressure (mmHg)IndexWaveform Comment +--------+------------------+-----+---------+-------+ [id number]     +--------+------------------+-----+---------+-------+ PTA 204 1.13 triphasic  +--------+------------------+-----+---------+-------+ DP 194 1.07 biphasic    +--------+------------------+-----+---------+-------+ +-------+-----------+-----------+------------+------------+ ABI/TBIT oday's ABIT oday's TBIPrevious ABIPrevious TBI +-------+-----------+-----------+------------+------------+ Right 1.06 0.8 1.26 0.65  +-------+-----------+-----------+------------+------------+ Left 1.13 amputation 1.15 amputation  +-------+-----------+-----------+------------+------------+ Previous ABI on 08/06/20 at [facility] Pedal pressures falsely elevated due to medial calcification. GERRITT, GALENTINE ([id number]) [id number] Page 3 of 10 Summary: Right: Resting right ankle-brachial index is within normal range. The right toe-brachial index is normal. Left: Resting left ankle-brachial index is within normal range. READMISSION 08/04/2021 Mr. Glasscock is now a 67 year old man who I remember from this clinic many years ago I think he had a right lower extremity predominantly venous wound at the time. He was here for 1 visit in March of this year had wounds on his right second and third toes we apparently dressed them many and they healed so he did not come back. He is listed in [place] is being a diabetic although the patient denies this says he is verified it with his primary doctor. In any case over the last several weeks or so according the patient although these wounds look somewhat more chronic than that he has developed predominantly large wounds on the right medial and right lateral ankle smaller areas on the left leg and areas on the dorsal aspect of his right second and third toes. Its not clear how he has been dressing these. 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 The patient has known PAD. He had an angiogram in August 2022 at which time he had nonhealing wounds in both feet. On the left his major vessels in the thigh were all patent. He  had three-vessel patent to the level of the ankle. He had a very short occlusion in the left anterior tibial. On the right lower extremity the major vessels in his thigh were all patent. He had three-vessel runoff to the foot sluggish filling of the anterior tibial artery. He was felt to have some component of small vessel disease but nothing that was amenable or needed revascularization. It was recommended that he have amputation of the second and third toes on the right foot if they did not heal He has been following with [name] of podiatry. [name] got him juxta lite stockings although I do not think he had them on properly he has uncontrolled edema in both legs Past medical history includes type 2 diabetes [although the patient really denies this], left TMA in 2022,lower extremity wounds in fact attendance at this clinic in 2009-2010, A-fib on Eliquis, chronic venous insufficiency with secondary lymphedema history  of non-Hodgkin's lymphoma. 08-11-2021 upon evaluation today patient presents for follow-up evaluation he was seen last Wednesday initially for inspection here in our clinic. With that being said he tells me that he unfortunately has been having a lot of drainage and is actually coming through his wrap. Fortunately I do not see any evidence of active infection locally or systemically at this time which is great news. No fevers, chills, nausea, vomiting, or diarrhea. With that being said there does appear to be some evidence of local infection based on what I am seeing today. 08-18-2021 upon evaluation today patient appears to be doing okay currently in regard to his wounds with that being said that he is doing much better but still has a long ways to go to get to where he wanted to be. I think the infection is significantly improved. He has another week of the antibiotic at this point. 08-25-2021 upon evaluation today patient's wounds are actually doing decently well he has erythema has  significantly improved. I think the cellulitis is under controlled I am going to continue him on 2 more weeks of the Levaquin at 500 mg this is a lower dose but I am hoping it will be better for him. 09-08-2021 upon evaluation today patient appears to be doing excellent in regard to his wounds. Since I last saw him he was actually in the hospital where he ended up having a pacemaker put in. Subsequently he tells me that he is actually doing quite well although they were unsure whether they were going to do it due to the fact that he had the wounds on his legs. And then I am glad they did anything seem to be doing well. 09-15-2021 upon evaluation patient's wounds are actually showing signs of improvement. The right side wounds do appear to have some need for sharp debridement today and I Georgina Peer go ahead and proceed with that. I think that if we get the wounds cleaned up he will actually show signs of continued improvement. I am also leaning towards switching to [facility] which I think will be a much better option for him. 09-22-2021 patient's wounds are showing signs of excellent improvement. I am actually extremely pleased with where we stand and I think that the patient is making great progress. There does not appear to be any signs of active infection. 09-29-2021 upon evaluation today patient appears to be doing excellent in regard to his wounds. He is actually tolerating the dressing changes without complication. Fortunately I see no evidence of active infection locally or systemically at this time which is great news and overall I am extremely pleased with where we stand currently. 09-2721 upon evaluation today patient actually appears to be doing excellent in regard to his wounds. The left leg is almost completely healed the right ankle is significantly smaller. Overall I am extremely pleased with where we stand at this point. I do not see any evidence of active infection at this time. 10-13-2021  upon evaluation today patient appears to be doing excellent in regard to his wounds. I really feel like he is making good progress here and I am very pleased in that regard. Fortunately I do not see any signs of active infection at this time. We are using the [facility] topical antibiotic therapy. 10-20-2021 upon evaluation today patient appears to be doing well with regard to his wound on the right medial ankle region the left leg is almost completely healed. I am actually very pleased with where  we stand today. 11-03-2021 upon evaluation today patient's wound actually is going require some sharp debridement but appears to be doing much better which is great news. Fortunately I do not see any signs of active infection at this time. 11-10-2021 upon evaluation today patient's wound is actually showing signs of excellent improvement. Fortunately I do not see any evidence of infection locally or systemically which is great news and overall I am extremely pleased with where we stand today. I do believe he is making good progress he does have his [facility] topical antibiotics with him here today. 11-24-2021 upon evaluation today patient actually showing signs of excellent improvement this appears to be doing much better. Fortunately I do not see any evidence of infection locally or systemically at this time. 12-01-2021 upon evaluation today patient appears to be doing well currently in regard to his wound. He has been tolerating the dressing changes without complication. Fortunately I do not see any evidence of active infection at this time which is great news and overall I am extremely pleased with where we stand today. 12-15-2021 upon evaluation today patient appears to be doing well with regard to his wound. He is showing signs of improvement is slow but nonetheless we are making improvements here. Fortunately I do not see any evidence of active infection locally nor systemically at this time. We are still  using the [facility] topical antibiotic over the open area only. 12-29-2021 upon evaluation today patient's wound actually is showing signs of excellent improvement. It has been 2 weeks since I perform any debridement and it definitely shows he has some tissue that needs to be cleaned away but I think we can do so quite easily and readily today. The good news is I do not see any signs of infection I think he is doing much better in that regard. Overall I am extremely pleased with where we stand. 01-12-2022 upon evaluation today patient appears to be doing well currently in regard to his wound although it is not getting significantly smaller it is also not getting any larger. Fortunately I do not see any evidence of infection locally nor systemically which is great news and overall I am extremely pleased with where things stand currently. Electronic Signature(s) FRANKE, MENTER D ([id number]) [id number] Page 4 of 10 Signed: 01/12/2022 4:15:00 PM By: [name] PA-C Entered By: [name] on 01/12/2022 16:15:00 -------------------------------------------------------------------------------- Physical Exam Details Patient Name: Date of Service: ATTIKUS, BARTOSZEK D. 01/12/2022 3:15 PM Medical Record Number: [id number] Patient Account Number: [id number] Date of Birth/Sex: Treating RN: 19-Apr-1955 (67 y.o. M) Primary Care Provider: [name] Other Clinician: Referring Provider: Treating Provider/Extender: [name] in Treatment: 23 Constitutional Obese and well-hydrated in no acute distress. Respiratory normal breathing without difficulty. Psychiatric this patient is able to make decisions and demonstrates good insight into disease process. Alert and Oriented x 3. pleasant and cooperative. Notes Patient's wound bed actually showed signs of good granulation epithelization at this point. Fortunately I think that we are headed in the right  direction. I do not think that there is any digression it is just that he is not progressing quite as quickly as I would like to see. For that reason I did discuss switching to a different dressing, recommend that we try the PolyMem to see if this could be beneficial. Patient is in agreement with giving this a trial. Electronic Signature(s) Signed: 01/12/2022 4:15:26 PM By: [name] PA-C Entered By: [name]  on 01/12/2022 16:15:26 -------------------------------------------------------------------------------- Physician Orders Details Patient Name: Date of Service: DABID, [name] 01/12/2022 3:15 PM Medical Record Number: [id number] Patient Account Number: [id number] Date of Birth/Sex: Treating RN: 1955/01/17 (67 y.o. [name], [name] Primary Care Provider: [name] Other Clinician: Referring Provider: Treating Provider/Extender: [name] in Treatment: 23 Verbal / Phone Orders: No Diagnosis Coding ICD-10 Coding Code Description I89.0 Lymphedema, not elsewhere classified I87.333 Chronic venous hypertension (idiopathic) with ulcer and inflammation of bilateral lower extremity L97.828 Non-pressure chronic ulcer of other part of left lower leg with other specified severity L97.818 Non-pressure chronic ulcer of other part of right lower leg with other specified severity E11.621 Type 2 diabetes mellitus with foot ulcer L97.518 Non-pressure chronic ulcer of other part of right foot with other specified severity Follow-up Appointments ppointment in 1 week. Margarita Grizzle on Wednesday Return A ppointment in 2 weeks. Margarita Grizzle on Wednesday Return A Anesthetic (In clinic) Topical Lidocaine 5% applied to wound bed MOISHE, SCHELLENBERG ([id number]) [id number] Page 5 of 10 Edema Control - Lymphedema / SCD / Other Elevate legs to the level of the heart or above for 30 minutes daily and/or when sitting for 3-4 times a day throughout the  day. Avoid standing for long periods of time. Exercise regularly Moisturize legs daily. - apply every night before bed to left leg. Compression stocking or Garment 30-40 mm/Hg pressure to: - wear your VIVE compression stocking to left leg. apply in the morning and remove at night. Will apply tubigrip size D to left leg. Remove once you get home and use your compression stockings. Wound Treatment Wound #7 - Malleolus Wound Laterality: Right, Medial Peri-Wound Care: Triamcinolone 15 (g) 1 x Per Week/30 Days Discharge Instructions: Use triamcinolone 15 (g) as directed Peri-Wound Care: Sween Lotion (Moisturizing lotion) 1 x Per Week/30 Days Discharge Instructions: Apply moisturizing lotion as directed Prim Dressing: PolyMem Silver Non-Adhesive Dressing, 4.25x4.25 in 1 x Per Week/30 Days ary Discharge Instructions: Apply to wound bed.****CUT SLITS IN THE POLYMEM PLEASE.**** Secondary Dressing: Zetuvit Plus 4x8 in 1 x Per Week/30 Days Discharge Instructions: Apply over primary dressing as directed. Compression Wrap: ThreePress (3 layer compression wrap) 1 x Per Week/30 Days Discharge Instructions: Apply three layer use kerlix instead of cotton compression as directed. Electronic Signature(s) Signed: 01/12/2022 4:16:28 PM By: [name] PA-C Signed: 01/12/2022 5:47:40 PM By: Deon Pilling RN, BSN Entered By: Deon Pilling on 01/12/2022 15:59:47 -------------------------------------------------------------------------------- Problem List Details Patient Name: Date of Service: Mattila, [name] 01/12/2022 3:15 PM Medical Record Number: [id number] Patient Account Number: [id number] Date of Birth/Sex: Treating RN: 09/17/1955 (67 y.o. M) Primary Care Provider: [name] Other Clinician: Referring Provider: Treating Provider/Extender: [name] in Treatment: 23 Active Problems ICD-10 Encounter Code Description Active Date MDM Diagnosis I89.0 Lymphedema, not  elsewhere classified 08/04/2021 No Yes I87.333 Chronic venous hypertension (idiopathic) with ulcer and inflammation of 08/04/2021 No Yes bilateral lower extremity L97.828 Non-pressure chronic ulcer of other part of left lower leg with other specified 08/04/2021 No Yes severity L97.818 Non-pressure chronic ulcer of other part of right lower leg with other specified 08/04/2021 No Yes severity E11.621 Type 2 diabetes mellitus with foot ulcer 08/04/2021 No Yes Ceci, Allin D ([id number]) [id number] Page 6 of 10 L97.518 Non-pressure chronic ulcer of other part of right foot with other specified 08/04/2021 No Yes severity Inactive Problems Resolved Problems Electronic Signature(s) Signed: 01/12/2022 3:39:15 PM By: [name] PA-C Entered By: Melburn Hake,  Dolan Xia on 01/12/2022 15:39:15 -------------------------------------------------------------------------------- Progress Note Details Patient Name: Date of Service: JOVON, WINTERHALTER 01/12/2022 3:15 PM Medical Record Number: [id number] Patient Account Number: [id number] Date of Birth/Sex: Treating RN: [birth year] (67 y.o. M) Primary Care Provider: [name] Other Clinician: Referring Provider: Treating Provider/Extender: [name] in Treatment: 23 Subjective Chief Complaint Information obtained from Patient 08/04/2021; patient returns to clinic with bilateral leg wounds as well as areas on the right foot History of Present Illness (HPI) ADMISSION 03/22/2021 This is a 67 year old man with a past medical history significant for diabetes type 2, congestive heart failure, peripheral arterial disease, morbid obesity, venous insufficiency, and coronary artery disease. He has been followed by [name] in podiatry, who performed a transmetatarsal amputation on the left foot in August 2022. He had issues healing that wound, but based upon Dr. [name] notes, ultimately the TMA wound healed. During his  recovery from that surgery, however, ulcers opened up over the DIP joint of the right second and third toe. These have apparently closed and reopened multiple times. It sounds like one of the issues has been moisture accumulation and maceration of the tissues causing them to reopen. At his last visit with [name], on March 01, 2021, there continues to be problems with moisture and he was referred to wound care for further evaluation and management. He had a formal aortogram with runoff performed prior to his TMA. The findings are copied here: Patient has inline flow to both feet with no significant flow-limiting lesion that would be amenable to percutaneous or open revascularization. He does have an element of small vessel disease and has a short segment occlusion of the distal anterior tibial/dorsalis pedis artery on the left foot but does have posterior tibial artery flow. Would recommend management of wounds with amputation of toes 2 and 3 on the right foot if the wounds do not heal and deteriorate. Transmetatarsal amputation on the left side has as good a blood supply as it is going to get and hopefully this will heal in the future. Formal ABIs were done in January 2023. They are normal bilaterally. ABI Findings: +---------+------------------+-----+----------+--------+ Right Rt Pressure (mmHg)IndexWaveform Comment  +---------+------------------+-----+----------+--------+ Brachial 160    +---------+------------------+-----+----------+--------+ PTA 192 1.06 monophasic  +---------+------------------+-----+----------+--------+ DP 159 0.88 monophasic  +---------+------------------+-----+----------+--------+ Great T oe145 0.80    +---------+------------------+-----+----------+--------+ +--------+------------------+-----+---------+-------+ Left Lt Pressure (mmHg)IndexWaveform Comment +--------+------------------+-----+---------+-------+ [id number]     +--------+------------------+-----+---------+-------+ PTA 204 1.13 triphasic  +--------+------------------+-----+---------+-------+ DP 194 1.07 biphasic   +--------+------------------+-----+---------+-------+ +-------+-----------+-----------+------------+------------+ ABI/TBIT oday's ABIT oday's TBIPrevious ABIPrevious TBI +-------+-----------+-----------+------------+------------+ DELMAR, ARRIAGA D ([id number]) [id number] Page 7 of 10 Right 1.06 0.8 1.26 0.65  +-------+-----------+-----------+------------+------------+ Left 1.13 amputation 1.15 amputation  +-------+-----------+-----------+------------+------------+ Previous ABI on 08/06/20 at [facility] pressures falsely elevated due to medial calcification. Summary: Right: Resting right ankle-brachial index is within normal range. The right toe-brachial index is normal. Left: Resting left ankle-brachial index is within normal range. READMISSION 08/04/2021 Mr. Pandya is now a 67 year old man who I remember from this clinic many years ago I think he had a right lower extremity predominantly venous wound at the time. He was here for 1 visit in March of this year had wounds on his right second and third toes we apparently dressed them many and they healed so he did not come back. He is listed in  is being a diabetic although the patient denies this says he is verified it with his primary doctor. In any case over the last several weeks or  so according the patient although these wounds look somewhat more chronic than that he has developed predominantly large wounds on the right medial and right lateral ankle smaller areas on the left leg and areas on the dorsal aspect of his right second and third toes. Its not clear how he has been dressing these. 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 The  patient has known PAD. He had an angiogram in August 2022 at which time he had nonhealing wounds in both feet. On the left his major vessels in the thigh were all patent. He had three-vessel patent to the level of the ankle. He had a very short occlusion in the left anterior tibial. On the right lower extremity the major vessels in his thigh were all patent. He had three-vessel runoff to the foot sluggish filling of the anterior tibial artery. He was felt to have some component of small vessel disease but nothing that was amenable or needed revascularization. It was recommended that he have amputation of the second and third toes on the right foot if they did not heal He has been following with [name] of podiatry. [name] got him juxta lite stockings although I do not think he had them on properly he has uncontrolled edema in both legs Past medical history includes type 2 diabetes [although the patient really denies this], left TMA in 2022,lower extremity wounds in fact attendance at this clinic in 2009-2010, A-fib on Eliquis, chronic venous insufficiency with secondary lymphedema history of non-Hodgkin's lymphoma. 08-11-2021 upon evaluation today patient presents for follow-up evaluation he was seen last Wednesday initially for inspection here in our clinic. With that being said he tells me that he unfortunately has been having a lot of drainage and is actually coming through his wrap. Fortunately I do not see any evidence of active infection locally or systemically at this time which is great news. No fevers, chills, nausea, vomiting, or diarrhea. With that being said there does appear to be some evidence of local infection based on what I am seeing today. 08-18-2021 upon evaluation today patient appears to be doing okay currently in regard to his wounds with that being said that he is doing much better but still has a long ways to go to get to where he wanted to be. I think the infection is  significantly improved. He has another week of the antibiotic at this point. 08-25-2021 upon evaluation today patient's wounds are actually doing decently well he has erythema has significantly improved. I think the cellulitis is under controlled I am going to continue him on 2 more weeks of the Levaquin at 500 mg this is a lower dose but I am hoping it will be better for him. 09-08-2021 upon evaluation today patient appears to be doing excellent in regard to his wounds. Since I last saw him he was actually in the hospital where he ended up having a pacemaker put in. Subsequently he tells me that he is actually doing quite well although they were unsure whether they were going to do it due to the fact that he had the wounds on his legs. And then I am glad they did anything seem to be doing well. 09-15-2021 upon evaluation patient's wounds are actually showing signs of improvement. The right side wounds do appear to have some need for sharp debridement today and I Georgina Peer go ahead and proceed with that. I think  that if we get the wounds cleaned up he will actually show signs of continued improvement. I am also leaning towards switching to [facility] which I think will be a much better option for him. 09-22-2021 patient's wounds are showing signs of excellent improvement. I am actually extremely pleased with where we stand and I think that the patient is making great progress. There does not appear to be any signs of active infection. 09-29-2021 upon evaluation today patient appears to be doing excellent in regard to his wounds. He is actually tolerating the dressing changes without complication. Fortunately I see no evidence of active infection locally or systemically at this time which is great news and overall I am extremely pleased with where we stand currently. 09-2721 upon evaluation today patient actually appears to be doing excellent in regard to his wounds. The left leg is almost completely healed  the right ankle is significantly smaller. Overall I am extremely pleased with where we stand at this point. I do not see any evidence of active infection at this time. 10-13-2021 upon evaluation today patient appears to be doing excellent in regard to his wounds. I really feel like he is making good progress here and I am very pleased in that regard. Fortunately I do not see any signs of active infection at this time. We are using the [facility] topical antibiotic therapy. 10-20-2021 upon evaluation today patient appears to be doing well with regard to his wound on the right medial ankle region the left leg is almost completely healed. I am actually very pleased with where we stand today. 11-03-2021 upon evaluation today patient's wound actually is going require some sharp debridement but appears to be doing much better which is great news. Fortunately I do not see any signs of active infection at this time. 11-10-2021 upon evaluation today patient's wound is actually showing signs of excellent improvement. Fortunately I do not see any evidence of infection locally or systemically which is great news and overall I am extremely pleased with where we stand today. I do believe he is making good progress he does have his [facility] topical antibiotics with him here today. 11-24-2021 upon evaluation today patient actually showing signs of excellent improvement this appears to be doing much better. Fortunately I do not see any evidence of infection locally or systemically at this time. 12-01-2021 upon evaluation today patient appears to be doing well currently in regard to his wound. He has been tolerating the dressing changes without complication. Fortunately I do not see any evidence of active infection at this time which is great news and overall I am extremely pleased with where we stand today. 12-15-2021 upon evaluation today patient appears to be doing well with regard to his wound. He is showing signs of  improvement is slow but nonetheless we are making improvements here. Fortunately I do not see any evidence of active infection locally nor systemically at this time. We are still using the MARVELOUS, BOUWENS D ([id number]) [id number] Page 8 of 10 topical antibiotic over the open area only. 12-29-2021 upon evaluation today patient's wound actually is showing signs of excellent improvement. It has been 2 weeks since I perform any debridement and it definitely shows he has some tissue that needs to be cleaned away but I think we can do so quite easily and readily today. The good news is I do not see any signs of infection I think he is doing much better in that regard. Overall I am extremely  pleased with where we stand. 01-12-2022 upon evaluation today patient appears to be doing well currently in regard to his wound although it is not getting significantly smaller it is also not getting any larger. Fortunately I do not see any evidence of infection locally nor systemically which is great news and overall I am extremely pleased with where things stand currently. Objective Constitutional Obese and well-hydrated in no acute distress. Vitals Time Taken: 3:37 PM, Height: 71 in, Weight: 350 lbs, BMI: 48.8, Temperature: 98.3 F, Pulse: 80 bpm, Respiratory Rate: 18 breaths/min, Blood Pressure: 142/99 mmHg. Respiratory normal breathing without difficulty. Psychiatric this patient is able to make decisions and demonstrates good insight into disease process. Alert and Oriented x 3. pleasant and cooperative. General Notes: Patient's wound bed actually showed signs of good granulation epithelization at this point. Fortunately I think that we are headed in the right direction. I do not think that there is any digression it is just that he is not progressing quite as quickly as I would like to see. For that reason I did discuss switching to a different dressing, recommend that we try  the PolyMem to see if this could be beneficial. Patient is in agreement with giving this a trial. Integumentary (Hair, Skin) Wound #7 status is Open. Original cause of wound was Blister. The date acquired was: 07/21/2021. The wound has been in treatment 23 weeks. The wound is located on the Right,Medial Malleolus. The wound measures 3.9cm length x 2cm width x 0.2cm depth; 6.126cm^2 area and 1.225cm^3 volume. There is no tunneling or undermining noted. There is a medium amount of serosanguineous drainage noted. There is large (67-100%) red granulation within the wound bed. There is a small (1-33%) amount of necrotic tissue within the wound bed. The periwound skin appearance exhibited: Dry/Scaly. The periwound skin appearance did not exhibit: Callus, Crepitus, Excoriation, Induration, Rash, Scarring, Maceration, Atrophie Blanche, Cyanosis, Ecchymosis, Hemosiderin Staining, Mottled, Pallor, Rubor, Erythema. 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 There was a Excisional Skin/Subcutaneous Tissue Debridement with a total area of 16 sq cm performed by [name], PA. With the following instrument(s): Curette to remove Viable and Non-Viable tissue/material. Material removed includes Subcutaneous Tissue, Slough, Skin: Dermis, and Skin: Epidermis after achieving pain control using Lidocaine 4% Topical  Solution. A time out was conducted at 15:50, prior to the start of the procedure. A Minimum amount of bleeding was controlled with Pressure. The procedure was tolerated well with a pain level of 0 throughout and a pain level of 0 following the procedure. Post Debridement Measurements: 3.9cm length x 4cm width x 0.2cm depth; 2.45cm^3 volume. Character of Wound/Ulcer Post Debridement is improved. Severity of Tissue Post Debridement is: Fat layer exposed. Post procedure Diagnosis Wound #7: Same as Pre-Procedure Pre-procedure diagnosis of Wound #7 is a Venous Leg Ulcer located on the Right,Medial Malleolus . There was a Three Layer Compression Therapy Procedure by Deon Pilling, RN. Post procedure Diagnosis Wound #7: Same as Pre-Procedure Prunty, Ho D (  [id number]) [id number] Page 9 of 10 Plan Follow-up Appointments: Return Appointment in 1 week. Margarita Grizzle on Wednesday Return Appointment in 2 weeks. Margarita Grizzle on Wednesday Anesthetic: (In clinic) Topical Lidocaine 5% applied to wound bed Edema Control - Lymphedema / SCD / Other: Elevate legs to the level of the heart or above for 30 minutes daily and/or when sitting for 3-4 times a day throughout the day. Avoid standing for long periods of time. Exercise regularly Moisturize legs daily. - apply every night before bed to left leg. Compression stocking or Garment 30-40 mm/Hg pressure to: - wear your VIVE compression stocking to left leg. apply in the morning and remove at night. Will apply tubigrip size D to left leg. Remove once you get home and use your compression stockings. WOUND #7: - Malleolus Wound Laterality: Right, Medial Peri-Wound Care: Triamcinolone 15 (g) 1 x Per Week/30 Days Discharge Instructions: Use triamcinolone 15 (g) as directed Peri-Wound Care: Sween Lotion (Moisturizing lotion) 1 x Per Week/30 Days Discharge Instructions: Apply moisturizing lotion as directed Prim Dressing: PolyMem Silver Non-Adhesive  Dressing, 4.25x4.25 in 1 x Per Week/30 Days ary Discharge Instructions: Apply to wound bed.****CUT SLITS IN THE POLYMEM PLEASE.**** Secondary Dressing: Zetuvit Plus 4x8 in 1 x Per Week/30 Days Discharge Instructions: Apply over primary dressing as directed. Com pression Wrap: ThreePress (3 layer compression wrap) 1 x Per Week/30 Days Discharge Instructions: Apply three layer use kerlix instead of cotton compression as directed. 1. I am going to recommend that we have the patient continue with the PolyMem silver and we will also continue with the compression wrap which I think has been beneficial. We have been utilizing the 3 layer compression wrap up to this point. 2. I am also can recommend that we have the patient continue with the Zetuvit to cover over top of the PolyMem. 3. I am also can recommend he continue to elevate his legs he also notes that his cardiologist to get some of the overall edema under control with some medication changes which I think will be beneficial as well. We will see patient back for reevaluation in 1 week here in the clinic. If anything worsens or changes patient will contact our office for additional recommendations. Electronic Signature(s) Signed: 01/12/2022 4:16:00 PM By: [name] PA-C Entered By: [name] on 01/12/2022 16:16:00 -------------------------------------------------------------------------------- SuperBill Details Patient Name: Date of Service: Bigos, [name] 01/12/2022 Medical Record Number: [id number] Patient Account Number: [id number] Date of Birth/Sex: Treating RN: May 24, 1955 (67 y.o. [name], [name] Primary Care Provider: [name] Other Clinician: Referring Provider: Treating Provider/Extender: [name] in Treatment: 23 Diagnosis Coding ICD-10 Codes Code Description I89.0 Lymphedema, not elsewhere classified I87.333 Chronic venous hypertension (idiopathic) with ulcer and inflammation of  bilateral lower extremity L97.828 Non-pressure chronic ulcer of other part of left lower leg with other specified severity L97.818 Non-pressure chronic ulcer of other part of right lower leg with other specified severity E11.621 Type 2 diabetes mellitus with foot ulcer L97.518 Non-pressure chronic ulcer of other part of right foot with other specified severity Facility Procedures Physician Procedures : CPT4 Code Description Modifier [id number] - WC PHYS SUBQ TISS 20 SQ CM ICD-10 Diagnosis Description L97.818 Non-pressure chronic ulcer of other part of right lower leg with other specified severity Quantity: 1 Electronic Signature(s) Signed: 01/12/2022 4:16:07 PM By: [name] PA-C Entered By: [name] on 01/12/2022 16:16:07

## 2022-01-13 ENCOUNTER — Encounter: Payer: Self-pay | Admitting: Internal Medicine

## 2022-01-13 ENCOUNTER — Telehealth: Payer: Self-pay

## 2022-01-13 NOTE — Progress Notes (Signed)
[name] ([id number]) [id number] Page 1 of 6 Visit Report for 12/15/2021 Arrival Information Details Patient Name: Date of Service: [name], [name] 12/15/2021 2:30 PM Medical Record Number: [id number] Patient Account Number: [id number] Date of Birth/Sex: Treating RN: [birth year] (67 y.o. [name], [name] Primary Care [name]: [name] Other Clinician: Referring [name]: Treating [name]/Extender: [name]: 57 Visit Information History Since Last Visit Added or deleted any medications: No Patient Arrived: [name] Any new allergies or adverse reactions: No Arrival Time: 15:12 Had a fall or experienced change in No Accompanied By: self activities of daily living that may affect Transfer Assistance: None risk of falls: Patient Identification Verified: Yes Signs or symptoms of abuse/neglect since last visito No Secondary Verification Process Completed: Yes Hospitalized since last visit: No Patient Requires Transmission-Based Precautions: No Implantable device outside of the clinic excluding No Patient Has Alerts: Yes cellular tissue based products placed in the center Patient Alerts: Patient on Blood Thinner since last visit: 01/2021 ABI L 1.13 R 1.06 Has Dressing in Place as Prescribed: Yes 01/2021 TBI L amp R 0.8 Has Compression in Place as Prescribed: Yes Pain Present Now: No Electronic Signature(s) Signed: 12/15/2021 5:04:42 PM By: [name] Pilling RN, BSN Entered By: [name] on 12/15/2021 15:15:08 -------------------------------------------------------------------------------- Compression Therapy Details Patient Name: Date of Service: [name], [name]. 12/15/2021 2:30 PM Medical Record Number: [id number] Patient Account Number: [id number] Date of Birth/Sex: Treating RN: 1955/05/18 (67 y.o. [name] Primary Care Tichina Koebel: [name] Other Clinician: Referring [name]: Treating  [name]/Extender: [name]: 19 Compression Therapy Performed for Wound Assessment: Wound #7 Right,Medial Malleolus Performed By: Clinician [name] Hammock, RN Compression Type: Three Layer Post Procedure Diagnosis Same as Pre-procedure Electronic Signature(s) Signed: 01/12/2022 5:35:40 PM By: [name] Hammock RN Entered By: [name] on 12/15/2021 15:39:38 Balli, Kache [name] ([id number]) [id number] Page 2 of 6 -------------------------------------------------------------------------------- Encounter Discharge Information Details Patient Name: Date of Service: [name], [name] 12/15/2021 2:30 PM Medical Record Number: [id number] Patient Account Number: [id number] Date of Birth/Sex: Treating RN: 11/11/1955 (67 y.o. [name], [name] Primary Care Alesha Jaffee: [name] Other Clinician: Referring [name]: Treating [name]/Extender: [name]: 19 Encounter Discharge Information Items Post Procedure Vitals Discharge Condition: Stable Temperature (F): 98.7 Ambulatory Status: Walker Pulse (bpm): 74 Discharge Destination: Home Respiratory Rate (breaths/min): 17 Transportation: Private Auto Blood Pressure (mmHg): 133/82 Accompanied By: self Schedule Follow-up Appointment: Yes Clinical Summary of Care: Patient Declined Electronic Signature(s) Signed: 01/12/2022 5:35:40 PM By: [name] Hammock RN Entered By: [name] on 12/15/2021 15:34:11 -------------------------------------------------------------------------------- Lower Extremity Assessment Details Patient Name: Date of Service: [name], [name]. 12/15/2021 2:30 PM Medical Record Number: [id number] Patient Account Number: [id number] Date of Birth/Sex: Treating RN: July 05, 1955 (67 y.o. [name] Primary Care Abas Leicht: [name] Other Clinician: Referring Keriana Sarsfield: Treating Hildur Bayer/Extender: [name]: 19 Edema Assessment Assessed: [name]: No] [name]: Yes] Edema: [Left: Yes] [Right: No] Calf Left: Right: Point of Measurement: 31 cm From Medial Instep 38 cm Ankle Left: Right: Point of Measurement: 11 cm From Medial Instep 26 cm Vascular Assessment Pulses: Dorsalis Pedis Palpable: [Right:Yes] Electronic Signature(s) Signed: 12/15/2021 5:04:42 PM By: [name] Pilling RN, BSN Entered By: [name] on 12/15/2021 15:15:35 [name] -------------------------------------------------------------------------------- [name] ([id number]) [id number] Page 3 of 6 Patient Name: Date of Service: [name], [name] 12/15/2021 2:30 PM Medical Record Number: [id number] Patient Account  Number: [id number] Date of Birth/Sex: Treating RN: 03-05-1955 (67 y.o. [name], [name] Primary Care Anicia Leuthold: [name] Other Clinician: Referring Michiel Sivley: Treating Timber Lucarelli/Extender: [name]: [place] reviewed with physician Active Inactive Pain, Acute or Chronic Nursing Diagnoses: Pain, acute or chronic: actual or potential Potential alteration in comfort, pain Goals: Patient will verbalize adequate pain control and receive pain control interventions during procedures as needed Date Initiated: 08/04/2021 Target Resolution Date: 01/08/2022 Goal Status: Active Patient/caregiver will verbalize comfort level met Date Initiated: 08/04/2021 Target Resolution Date: 01/08/2022 Goal Status: Active Interventions: Encourage patient to take pain medications as prescribed Provide education on pain management Reposition patient for comfort [name] Activities: Administer pain control measures as ordered : 08/04/2021 Notes: Electronic Signature(s) Signed: 01/12/2022 5:35:40 PM By: [name] Hammock RN Entered By: [name] on 12/15/2021  15:32:01 -------------------------------------------------------------------------------- Pain Assessment Details Patient Name: Date of Service: [name], [name]. 12/15/2021 2:30 PM Medical Record Number: [id number] Patient Account Number: [id number] Date of Birth/Sex: Treating RN: 05-Oct-1955 (67 y.o. [name] Primary Care Arnice Vanepps: [name] Other Clinician: Referring Patti Shorb: Treating Leshae Mcclay/Extender: [name]: 19 Active Problems Location of Pain Severity and Description of Pain Patient Has Paino No Site Locations Rate the pain. [name], [name] ([id number]) [id number] Page 4 of 6 Rate the pain. Current Pain Level: 0 Pain Management and Medication Current Pain Management: Medication: No Cold Application: No Rest: No Massage: No Activity: No T.E.N.S.: No Heat Application: No Leg drop or elevation: No Is the Current Pain Management Adequate: Adequate How does your wound impact your activities of daily livingo Sleep: No Bathing: No Appetite: No Relationship With Others: No Bladder Continence: No Emotions: No Bowel Continence: No Work: No Toileting: No Drive: No Dressing: No Hobbies: No Engineer, maintenance) Signed: 12/15/2021 5:04:42 PM By: [name] Pilling RN, BSN Entered By: [name] on 12/15/2021 15:15:29 -------------------------------------------------------------------------------- Patient/Caregiver Education Details Patient Name: Date of Service: [name] Gloss [name]. 12/6/2023andnbsp2:30 PM Medical Record Number: [id number] Patient Account Number: [id number] Date of Birth/Gender: Treating RN: 12-28-1955 (67 y.o. [name] Primary Care Physician: [name] Other Clinician: Referring Physician: Treating Physician/Extender: [name]: 19 Education Assessment Education Provided To: Patient Education Topics Provided Pain: Methods:  Explain/Verbal Responses: Reinforcements needed, State content correctly [employer]) Signed: 01/12/2022 5:35:40 PM By: [name] Hammock RN Entered By: [name] on 12/15/2021 15:32:09 Masaki, Earlie [name] ([id number]) [id number] Page 5 of 6 -------------------------------------------------------------------------------- Wound Assessment Details Patient Name: Date of Service: [name], [name] 12/15/2021 2:30 PM Medical Record Number: [id number] Patient Account Number: [id number] Date of Birth/Sex: Treating RN: 08-Dec-1955 (67 y.o. [name], [name] Primary Care Mckinsey Keagle: [name] Other Clinician: Referring Chiquita Heckert: Treating Quin Mathenia/Extender: [name]: 19 Wound Status Wound Number: 7 Primary Venous Leg Ulcer Etiology: Wound Location: Right, Medial Malleolus Secondary Lymphedema Wounding Event: Blister Etiology: Date Acquired: 07/21/2021 Wound Open Weeks Of [name]: 19 Status: Clustered Wound: No Comorbid Sleep Apnea, Arrhythmia, Congestive Heart Failure, History: Hypertension, Hypotension, Peripheral Arterial Disease, Peripheral Venous Disease, Type II Diabetes, Gout, Osteoarthritis Wound Measurements Length: (cm) 4 Width: (cm) 1.4 Depth: (cm) 0.2 Area: (cm) 4.398 Volume: (cm) 0.88 % Reduction in Area: 88.5% % Reduction in Volume: 96.7% Epithelialization: Large (67-100%) Tunneling: No Undermining: No Wound Description Classification: Full Thickness With Exposed Suppo Wound Margin: Distinct, outline attached Exudate Amount: Medium Exudate Type: Serosanguineous Exudate Color: red, brown rt Structures Foul Odor After  Cleansing: No Slough/Fibrino No Wound Bed Granulation Amount: Large (67-100%) Exposed Structure Granulation Quality: Pink, Pale Fascia Exposed: No Necrotic Amount: None Present (0%) Fat Layer (Subcutaneous Tissue) Exposed: Yes Tendon Exposed: No Muscle Exposed:  No Joint Exposed: No Bone Exposed: No Periwound Skin Texture Texture Color No Abnormalities Noted: No No Abnormalities Noted: No Callus: No Atrophie Blanche: No Crepitus: No Cyanosis: No Excoriation: No Ecchymosis: No Induration: No Erythema: No Rash: No Hemosiderin Staining: Yes Scarring: No Mottled: No Pallor: No Moisture Rubor: No No Abnormalities Noted: No Dry / Scaly: No Maceration: No Electronic Signature(s) Signed: 12/15/2021 5:04:42 PM By: [name] Pilling RN, BSN Entered By: [name] on 12/15/2021 15:17:37 [name], [name] ([id number]) [id number] Page 6 of 6 -------------------------------------------------------------------------------- Vitals Details Patient Name: Date of Service: [name], [name] 12/15/2021 2:30 PM Medical Record Number: [id number] Patient Account Number: [id number] Date of Birth/Sex: Treating RN: Sep 22, 1955 (67 y.o. [name], [name] Primary Care Ayana Imhof: [name] Other Clinician: Referring Casi Westerfeld: Treating Landi Biscardi/Extender: [name]: 19 Vital Signs Time Taken: 15:15 Temperature (F): 99 Height (in): 71 Pulse (bpm): 80 Weight (lbs): 350 Respiratory Rate (breaths/min): 20 Body Mass Index (BMI): 48.8 Blood Pressure (mmHg): 132/72 Reference Range: 80 - 120 mg / dl Electronic Signature(s) Signed: 12/15/2021 5:04:42 PM By: [name] Pilling RN, BSN Entered By: [name] on 12/15/2021 15:17:58

## 2022-01-13 NOTE — Telephone Encounter (Signed)
LMOVM for patient to give [place] a call back. Got a message that he needed some help with his app. Last transmission 01/11/2022

## 2022-01-13 NOTE — Progress Notes (Signed)
[name], [name] ([id number]) [id number] Page 1 of 6 Visit Report for 01/12/2022 Arrival Information Details Patient Name: Date of Service: [name], [name] 01/12/2022 3:15 PM Medical Record Number: [id number] Patient Account Number: [id number] Date of Birth/Sex: Treating RN: [birth year] (67 y.o. M) Primary Care [name]: [name] Other Clinician: Referring [name]: Treating [name]/Extender: [name] in Treatment: 23 Visit Information History Since Last Visit Added or deleted any medications: No Patient Arrived: [name] Any new allergies or adverse reactions: No Arrival Time: 15:37 Had a fall or experienced change in No Accompanied By: friend activities of daily living that may affect Transfer Assistance: None risk of falls: Patient Identification Verified: Yes Signs or symptoms of abuse/neglect since last visito No Secondary Verification Process Completed: Yes Hospitalized since last visit: No Patient Requires Transmission-Based Precautions: No Implantable device outside of the clinic excluding No Patient Has Alerts: Yes cellular tissue based products placed in the center Patient Alerts: Patient on Blood Thinner since last visit: 01/2021 ABI L 1.13 R 1.06 Has Compression in Place as Prescribed: Yes 01/2021 TBI L amp R 0.8 Pain Present Now: No Electronic Signature(s) Signed: 01/12/2022 4:38:21 PM By: [name] Entered By: [name] on 01/12/2022 15:37:34 -------------------------------------------------------------------------------- Compression Therapy Details Patient Name: Date of Service: [name] Gloss [name]. 01/12/2022 3:15 PM Medical Record Number: [id number] Patient Account Number: [id number] Date of Birth/Sex: Treating RN: 1955/12/12 (67 y.o. [name] Primary Care [name]: [name] Other Clinician: Referring [name]: Treating [name]/Extender: [name] in Treatment:  23 Compression Therapy Performed for Wound Assessment: Wound #7 Right,Medial Malleolus Performed By: Clinician [name] Pilling, RN Compression Type: Three Layer Post Procedure Diagnosis Same as Pre-procedure Electronic Signature(s) Signed: 01/12/2022 5:47:40 PM By: [name] Pilling RN, BSN Entered By: [name] on 01/12/2022 15:58:01 [name], [name] ([id number]) [id number] Page 2 of 6 -------------------------------------------------------------------------------- Encounter Discharge Information Details Patient Name: Date of Service: [name], [name] 01/12/2022 3:15 PM Medical Record Number: [id number] Patient Account Number: [id number] Date of Birth/Sex: Treating RN: 1955/04/02 (67 y.o. [name] Primary Care [name]: [name] Other Clinician: Referring Stanislaus [name]: Treating [name]/Extender: [name] in Treatment: 23 Encounter Discharge Information Items Post Procedure Vitals Discharge Condition: Stable Temperature (F): 98.3 Ambulatory Status: Walker Pulse (bpm): 80 Discharge Destination: Home Respiratory Rate (breaths/min): 20 Transportation: Private Auto Blood Pressure (mmHg): 142/99 Accompanied By: [name] Schedule Follow-up Appointment: Yes Clinical Summary of Care: Electronic Signature(s) Signed: 01/12/2022 5:47:40 PM By: [name] Pilling RN, BSN Entered By: [name] on 01/12/2022 16:03:05 -------------------------------------------------------------------------------- Lower Extremity Assessment Details Patient Name: Date of Service: [name], [name] 01/12/2022 3:15 PM Medical Record Number: [id number] Patient Account Number: [id number] Date of Birth/Sex: Treating RN: [birth year] (67 y.o. M) Primary Care Adrin Julian: [name] Other Clinician: Referring [name]: Treating [name]/Extender: [name] in Treatment: 23 Edema Assessment Assessed: [Left: No] [name]: No] Edema: [Left: N]  [Right: o] Calf Left: Right: Point of Measurement: 31 cm From Medial Instep 39.2 cm Ankle Left: Right: Point of Measurement: 11 cm From Medial Instep 27.1 cm Electronic Signature(s) Signed: 01/12/2022 4:38:21 PM By: [name] Entered By: [name] on 01/12/2022 15:38:11 -------------------------------------------------------------------------------- Multi-Disciplinary Care Plan Details Patient Name: Date of Service: [name], [name] Huger [name]. 01/12/2022 3:15 PM Medical Record Number: [id number] Patient Account Number: [id number] Date of Birth/Sex: Treating RN: [birth year] (67 y.o. [name] Primary Care [name]: [name] Other Clinician: Referring [name]: Treating [name]/Extender: [name] in  Treatment: [address] [name] ([id number]) [id number] Page 3 of 6 Multidisciplinary Care Plan reviewed with physician Active Inactive Pain, Acute or Chronic Nursing Diagnoses: Pain, acute or chronic: actual or potential Potential alteration in comfort, pain Goals: Patient will verbalize adequate pain control and receive pain control interventions during procedures as needed Date Initiated: 08/04/2021 Target Resolution Date: 02/04/2022 Goal Status: Active Patient/caregiver will verbalize comfort level met Date Initiated: 08/04/2021 Target Resolution Date: 02/11/2022 Goal Status: Active Interventions: Encourage patient to take pain medications as prescribed Provide education on pain management Reposition patient for comfort Treatment Activities: Administer pain control measures as ordered : 08/04/2021 Notes: Electronic Signature(s) Signed: 01/12/2022 5:47:40 PM By: [name] Pilling RN, BSN Entered By: [name] on 01/12/2022 15:53:44 -------------------------------------------------------------------------------- Pain Assessment Details Patient Name: Date of Service: [name], [name] 01/12/2022 3:15 PM Medical Record Number:  [id number] Patient Account Number: [id number] Date of Birth/Sex: Treating RN: [birth year] (67 y.o. M) Primary Care Tabathia Knoche: [name] Other Clinician: Referring [name]: Treating Verble Styron/Extender: [name] in Treatment: 23 Active Problems Location of Pain Severity and Description of Pain Patient Has Paino No Site Locations Pain Management and Medication [name], [name] ([id number]) [id number] Page 4 of 6 Current Pain Management: Electronic Signature(s) Signed: 01/12/2022 4:38:21 PM By: [name] Entered By: [name] on 01/12/2022 15:37:59 -------------------------------------------------------------------------------- Patient/Caregiver Education Details Patient Name: Date of Service: [name] 1/3/2024andnbsp3:15 PM Medical Record Number: [id number] Patient Account Number: [id number] Date of Birth/Gender: Treating RN: 1955/02/01 (67 y.o. [name] Primary Care Physician: [name] Other Clinician: Referring Physician: Treating Physician/Extender: [name] in Treatment: 23 Education Assessment Education Provided To: Patient Education Topics Provided Wound/Skin Impairment: Handouts: Caring for Your Ulcer Methods: Explain/Verbal Responses: Reinforcements needed Electronic Signature(s) Signed: 01/12/2022 5:47:40 PM By: [name] Pilling RN, BSN Entered By: [name] on 01/12/2022 15:54:52 -------------------------------------------------------------------------------- Wound Assessment Details Patient Name: Date of Service: [name], [name] 01/12/2022 3:15 PM Medical Record Number: [id number] Patient Account Number: [id number] Date of Birth/Sex: Treating RN: 03/15/1955 (67 y.o. M) Primary Care Meng Winterton: [name] Other Clinician: Referring Talissa Apple: Treating Ajooni Karam/Extender: [name] in Treatment: 23 Wound Status Wound Number: 7 Primary  Venous Leg Ulcer Etiology: Wound Location: Right, Medial Malleolus Secondary Lymphedema Wounding Event: Blister Etiology: Date Acquired: 07/21/2021 Wound Open Weeks Of Treatment: 23 Status: Clustered Wound: No Comorbid Sleep Apnea, Arrhythmia, Congestive Heart Failure, History: Hypertension, Hypotension, Peripheral Arterial Disease, Peripheral Venous Disease, Type II Diabetes, Gout, Osteoarthritis Photos [name], [name] ([id number]) [id number] Page 5 of 6 Wound Measurements Length: (cm) 3.9 Width: (cm) 2 Depth: (cm) 0.2 Area: (cm) 6.126 Volume: (cm) 1.225 % Reduction in Area: 84% % Reduction in Volume: 95.4% Tunneling: No Undermining: No Wound Description Classification: Full Thickness With Exposed Suppo Exudate Amount: Medium Exudate Type: Serosanguineous Exudate Color: red, brown rt Structures Foul Odor After Cleansing: No Slough/Fibrino No Wound Bed Granulation Amount: Large (67-100%) Granulation Quality: Red Necrotic Amount: Small (1-33%) Periwound Skin Texture Texture Color No Abnormalities Noted: No No Abnormalities Noted: No Callus: No Atrophie Blanche: No Crepitus: No Cyanosis: No Excoriation: No Ecchymosis: No Induration: No Erythema: No Rash: No Hemosiderin Staining: No Scarring: No Mottled: No Pallor: No Moisture Rubor: No No Abnormalities Noted: No Dry / Scaly: Yes Maceration: No Treatment Notes Wound #7 (Malleolus) Wound Laterality: Right, Medial Cleanser Peri-Wound Care Triamcinolone 15 (g) Discharge Instruction: Use triamcinolone 15 (g) as directed Sween Lotion (Moisturizing lotion) Discharge Instruction: Apply moisturizing lotion as directed Topical  Primary Dressing PolyMem Silver Non-Adhesive Dressing, 4.25x4.25 in Discharge Instruction: Apply to wound bed.****CUT SLITS IN THE POLYMEM PLEASE.**** Secondary Dressing Zetuvit Plus 4x8 in Discharge Instruction: Apply over primary dressing as  directed. Secured With Compression Wrap ThreePress (3 layer compression wrap) Discharge Instruction: Apply three layer use kerlix instead of cotton compression as directed. Compression Stockings [name], [name] ([id number]) [id number] Page 6 of 6 Add-Ons Electronic Signature(s) Signed: 01/12/2022 4:38:21 PM By: [name] Entered By: [name] on 01/12/2022 15:40:27 -------------------------------------------------------------------------------- Vitals Details Patient Name: Date of Service: [name], [name] 01/12/2022 3:15 PM Medical Record Number: [id number] Patient Account Number: [id number] Date of Birth/Sex: Treating RN: 09-20-1955 (67 y.o. M) Primary Care Kanika Bungert: [name] Other Clinician: Referring Orpha Dain: Treating Jennene Downie/Extender: [name] in Treatment: 23 Vital Signs Time Taken: 15:37 Temperature (F): 98.3 Height (in): 71 Pulse (bpm): 80 Weight (lbs): 350 Respiratory Rate (breaths/min): 18 Body Mass Index (BMI): 48.8 Blood Pressure (mmHg): 142/99 Reference Range: 80 - 120 mg / dl Electronic Signature(s) Signed: 01/12/2022 4:38:21 PM By: [name] Entered By: [name] on 01/12/2022 15:37:54

## 2022-01-19 ENCOUNTER — Encounter (HOSPITAL_BASED_OUTPATIENT_CLINIC_OR_DEPARTMENT_OTHER): Payer: Medicare Other | Admitting: Physician Assistant

## 2022-01-19 DIAGNOSIS — I87333 Chronic venous hypertension (idiopathic) with ulcer and inflammation of bilateral lower extremity: Secondary | ICD-10-CM | POA: Diagnosis not present

## 2022-01-19 DIAGNOSIS — I872 Venous insufficiency (chronic) (peripheral): Secondary | ICD-10-CM | POA: Diagnosis not present

## 2022-01-19 DIAGNOSIS — L97828 Non-pressure chronic ulcer of other part of left lower leg with other specified severity: Secondary | ICD-10-CM | POA: Diagnosis not present

## 2022-01-19 DIAGNOSIS — E11621 Type 2 diabetes mellitus with foot ulcer: Secondary | ICD-10-CM | POA: Diagnosis not present

## 2022-01-19 DIAGNOSIS — L97518 Non-pressure chronic ulcer of other part of right foot with other specified severity: Secondary | ICD-10-CM | POA: Diagnosis not present

## 2022-01-19 DIAGNOSIS — E1151 Type 2 diabetes mellitus with diabetic peripheral angiopathy without gangrene: Secondary | ICD-10-CM | POA: Diagnosis not present

## 2022-01-19 DIAGNOSIS — Z7901 Long term (current) use of anticoagulants: Secondary | ICD-10-CM | POA: Diagnosis not present

## 2022-01-19 DIAGNOSIS — L97818 Non-pressure chronic ulcer of other part of right lower leg with other specified severity: Secondary | ICD-10-CM | POA: Diagnosis not present

## 2022-01-19 DIAGNOSIS — M109 Gout, unspecified: Secondary | ICD-10-CM | POA: Diagnosis not present

## 2022-01-19 DIAGNOSIS — I890 Lymphedema, not elsewhere classified: Secondary | ICD-10-CM | POA: Diagnosis not present

## 2022-01-19 DIAGNOSIS — L97312 Non-pressure chronic ulcer of right ankle with fat layer exposed: Secondary | ICD-10-CM | POA: Diagnosis not present

## 2022-01-19 DIAGNOSIS — I2510 Atherosclerotic heart disease of native coronary artery without angina pectoris: Secondary | ICD-10-CM | POA: Diagnosis not present

## 2022-01-19 DIAGNOSIS — I509 Heart failure, unspecified: Secondary | ICD-10-CM | POA: Diagnosis not present

## 2022-01-19 DIAGNOSIS — I110 Hypertensive heart disease with heart failure: Secondary | ICD-10-CM | POA: Diagnosis not present

## 2022-01-19 NOTE — Progress Notes (Signed)
THATCHER, DOBERSTEIN ([id number]) [id number] Page 1 of 10 Visit Report for 01/19/2022 Chief Complaint Document Details Patient Name: Date of Service: [name], [name] 01/19/2022 3:15 PM Medical Record Number: [id number] Patient Account Number: [id number] Date of Birth/Sex: Treating RN: 07-30-1955 (67 y.o. M) Primary Care Provider: [name] Other Clinician: Referring Provider: Treating Provider/Extender: [name] in Treatment: 24 Information Obtained from: Patient Chief Complaint 08/04/2021; patient returns to clinic with bilateral leg wounds as well as areas on the right foot Electronic Signature(s) Signed: 01/19/2022 3:26:08 PM By: [name] PA-C Entered By: [name] on 01/19/2022 15:26:08 -------------------------------------------------------------------------------- Debridement Details Patient Name: Date of Service: [name], [name]. 01/19/2022 3:15 PM Medical Record Number: [id number] Patient Account Number: [id number] Date of Birth/Sex: Treating RN: 1955/04/14 (67 y.o. [name], [name] Primary Care Provider: [name] Other Clinician: Referring Provider: Treating Provider/Extender: [name] in Treatment: 24 Debridement Performed for Assessment: Wound #7 Right,Medial Malleolus Performed By: Physician [name], PA Debridement Type: Debridement Severity of Tissue Pre Debridement: Fat layer exposed Level of Consciousness (Pre-procedure): Awake and Alert Pre-procedure Verification/Time Out Yes - 15:25 Taken: Start Time: 15:26 Pain Control: Lidocaine 4% T opical Solution T Area Debrided (L x W): otal 4.3 (cm) x 2 (cm) = 8.6 (cm) Tissue and other material debrided: Viable, Non-Viable, Slough, Subcutaneous, Skin: Dermis , Skin: Epidermis, Slough Level: Skin/Subcutaneous Tissue Debridement Description: Excisional Instrument: Curette Bleeding: Minimum Hemostasis Achieved:  Pressure End Time: 15:31 Procedural Pain: 0 Post Procedural Pain: 0 Response to Treatment: Procedure was tolerated well Level of Consciousness (Post- Awake and Alert procedure): Post Debridement Measurements of Total Wound Length: (cm) 4.3 Width: (cm) 2 Depth: (cm) 0.2 Volume: (cm) 1.351 Character of Wound/Ulcer Post Debridement: Improved Severity of Tissue Post Debridement: Fat layer exposed [name], [name] ([id number]) [id number] Page 2 of 10 Post Procedure Diagnosis Same as Pre-procedure Electronic Signature(s) Signed: 01/19/2022 4:42:12 PM By: [name] PA-C Signed: 01/19/2022 5:22:16 PM By: Deon Pilling RN, BSN Entered By: Deon Pilling on 01/19/2022 15:31:49 -------------------------------------------------------------------------------- HPI Details Patient Name: Date of Service: [name], [name]. 01/19/2022 3:15 PM Medical Record Number: [id number] Patient Account Number: [id number] Date of Birth/Sex: Treating RN: 1955/02/22 (67 y.o. M) Primary Care Provider: [name] Other Clinician: Referring Provider: Treating Provider/Extender: [name] in Treatment: 24 History of Present Illness HPI Description: ADMISSION 03/22/2021 This is a 67 year old man with a past medical history significant for diabetes type 2, congestive heart failure, peripheral arterial disease, morbid obesity, venous insufficiency, and coronary artery disease. He has been followed by [name] in podiatry, who performed a transmetatarsal amputation on the left foot in August 2022. He had issues healing that wound, but based upon Dr. [name] notes, ultimately the TMA wound healed. During his recovery from that surgery, however, ulcers opened up over the DIP joint of the right second and third toe. These have apparently closed and reopened multiple times. It sounds like one of the issues has been moisture accumulation and maceration of the tissues  causing them to reopen. At his last visit with [name], on March 01, 2021, there continues to be problems with moisture and he was referred to wound care for further evaluation and management. He had a formal aortogram with runoff performed prior to his TMA. The findings are copied here: Patient has inline flow to both feet with no significant flow-limiting lesion that would be amenable to percutaneous or open revascularization.  He does have an element of small vessel disease and has a short segment occlusion of the distal anterior tibial/dorsalis pedis artery on the left foot but does have posterior tibial artery flow. Would recommend management of wounds with amputation of toes 2 and 3 on the right foot if the wounds do not heal and deteriorate. Transmetatarsal amputation on the left side has as good a blood supply as it is going to get and hopefully this will heal in the future. Formal ABIs were done in January 2023. They are normal bilaterally. ABI Findings: +---------+------------------+-----+----------+--------+ Right Rt Pressure (mmHg)IndexWaveform Comment  +---------+------------------+-----+----------+--------+ Brachial 160     +---------+------------------+-----+----------+--------+ PTA 192 1.06 monophasic  +---------+------------------+-----+----------+--------+ DP 159 0.88 monophasic  +---------+------------------+-----+----------+--------+ Great T oe145 0.80    +---------+------------------+-----+----------+--------+ +--------+------------------+-----+---------+-------+ Left Lt Pressure (mmHg)IndexWaveform Comment +--------+------------------+-----+---------+-------+ [id number]     +--------+------------------+-----+---------+-------+ PTA 204 1.13 triphasic  +--------+------------------+-----+---------+-------+ DP 194 1.07 biphasic    +--------+------------------+-----+---------+-------+ +-------+-----------+-----------+------------+------------+ ABI/TBIT oday's ABIT oday's TBIPrevious ABIPrevious TBI +-------+-----------+-----------+------------+------------+ Right 1.06 0.8 1.26 0.65  +-------+-----------+-----------+------------+------------+ Left 1.13 amputation 1.15 amputation  +-------+-----------+-----------+------------+------------+ Previous ABI on 08/06/20 at [facility] Pedal pressures falsely elevated due to medial calcification. TAI, SKELLY ([id number]) [id number] Page 3 of 10 Summary: Right: Resting right ankle-brachial index is within normal range. The right toe-brachial index is normal. Left: Resting left ankle-brachial index is within normal range. READMISSION 08/04/2021 Mr. Delaguila is now a 67 year old man who I remember from this clinic many years ago I think he had a right lower extremity predominantly venous wound at the time. He was here for 1 visit in March of this year had wounds on his right second and third toes we apparently dressed them many and they healed so he did not come back. He is listed in [place] is being a diabetic although the patient denies this says he is verified it with his primary doctor. In any case over the last several weeks or so according the patient although these wounds look somewhat more chronic than that he has developed predominantly large wounds on the right medial and right lateral ankle smaller areas on the left leg and areas on the dorsal aspect of his right second and third toes. Its not clear how he has been dressing these. 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 The patient has known PAD. He had an angiogram in August 2022 at which time he had nonhealing wounds in both feet. On the left his major vessels in the thigh were all patent. He  had three-vessel patent to the level of the ankle. He had a very short occlusion in the left anterior tibial. On the right lower extremity the major vessels in his thigh were all patent. He had three-vessel runoff to the foot sluggish filling of the anterior tibial artery. He was felt to have some component of small vessel disease but nothing that was amenable or needed revascularization. It was recommended that he have amputation of the second and third toes on the right foot if they did not heal He has been following with [name] of podiatry. [name] got him juxta lite stockings although I do not think he had them on properly he has uncontrolled edema in both legs Past medical history includes type 2 diabetes [although the patient really denies this], left TMA in 2022,lower extremity wounds in fact attendance at this clinic in 2009-2010, A-fib on Eliquis, chronic venous insufficiency with secondary lymphedema history  of non-Hodgkin's lymphoma. 08-11-2021 upon evaluation today patient presents for follow-up evaluation he was seen last Wednesday initially for inspection here in our clinic. With that being said he tells me that he unfortunately has been having a lot of drainage and is actually coming through his wrap. Fortunately I do not see any evidence of active infection locally or systemically at this time which is great news. No fevers, chills, nausea, vomiting, or diarrhea. With that being said there does appear to be some evidence of local infection based on what I am seeing today. 08-18-2021 upon evaluation today patient appears to be doing okay currently in regard to his wounds with that being said that he is doing much better but still has a long ways to go to get to where he wanted to be. I think the infection is significantly improved. He has another week of the antibiotic at this point. 08-25-2021 upon evaluation today patient's wounds are actually doing decently well he has erythema has  significantly improved. I think the cellulitis is under controlled I am going to continue him on 2 more weeks of the Levaquin at 500 mg this is a lower dose but I am hoping it will be better for him. 09-08-2021 upon evaluation today patient appears to be doing excellent in regard to his wounds. Since I last saw him he was actually in the hospital where he ended up having a pacemaker put in. Subsequently he tells me that he is actually doing quite well although they were unsure whether they were going to do it due to the fact that he had the wounds on his legs. And then I am glad they did anything seem to be doing well. 09-15-2021 upon evaluation patient's wounds are actually showing signs of improvement. The right side wounds do appear to have some need for sharp debridement today and I Georgina Peer go ahead and proceed with that. I think that if we get the wounds cleaned up he will actually show signs of continued improvement. I am also leaning towards switching to [facility] which I think will be a much better option for him. 09-22-2021 patient's wounds are showing signs of excellent improvement. I am actually extremely pleased with where we stand and I think that the patient is making great progress. There does not appear to be any signs of active infection. 09-29-2021 upon evaluation today patient appears to be doing excellent in regard to his wounds. He is actually tolerating the dressing changes without complication. Fortunately I see no evidence of active infection locally or systemically at this time which is great news and overall I am extremely pleased with where we stand currently. 09-2721 upon evaluation today patient actually appears to be doing excellent in regard to his wounds. The left leg is almost completely healed the right ankle is significantly smaller. Overall I am extremely pleased with where we stand at this point. I do not see any evidence of active infection at this time. 10-13-2021  upon evaluation today patient appears to be doing excellent in regard to his wounds. I really feel like he is making good progress here and I am very pleased in that regard. Fortunately I do not see any signs of active infection at this time. We are using the [facility] topical antibiotic therapy. 10-20-2021 upon evaluation today patient appears to be doing well with regard to his wound on the right medial ankle region the left leg is almost completely healed. I am actually very pleased with where  we stand today. 11-03-2021 upon evaluation today patient's wound actually is going require some sharp debridement but appears to be doing much better which is great news. Fortunately I do not see any signs of active infection at this time. 11-10-2021 upon evaluation today patient's wound is actually showing signs of excellent improvement. Fortunately I do not see any evidence of infection locally or systemically which is great news and overall I am extremely pleased with where we stand today. I do believe he is making good progress he does have his [facility] topical antibiotics with him here today. 11-24-2021 upon evaluation today patient actually showing signs of excellent improvement this appears to be doing much better. Fortunately I do not see any evidence of infection locally or systemically at this time. 12-01-2021 upon evaluation today patient appears to be doing well currently in regard to his wound. He has been tolerating the dressing changes without complication. Fortunately I do not see any evidence of active infection at this time which is great news and overall I am extremely pleased with where we stand today. 12-15-2021 upon evaluation today patient appears to be doing well with regard to his wound. He is showing signs of improvement is slow but nonetheless we are making improvements here. Fortunately I do not see any evidence of active infection locally nor systemically at this time. We are still  using the [facility] topical antibiotic over the open area only. 12-29-2021 upon evaluation today patient's wound actually is showing signs of excellent improvement. It has been 2 weeks since I perform any debridement and it definitely shows he has some tissue that needs to be cleaned away but I think we can do so quite easily and readily today. The good news is I do not see any signs of infection I think he is doing much better in that regard. Overall I am extremely pleased with where we stand. 01-12-2022 upon evaluation today patient appears to be doing well currently in regard to his wound although it is not getting significantly smaller it is also not getting any larger. Fortunately I do not see any evidence of infection locally nor systemically which is great news and overall I am extremely pleased with where things stand currently. 01-19-2022 upon evaluation today patient appears to be doing well currently in regard to his wound. The PolyMem actually seems to have done extremely well for him. Fortunately I do not see any signs of infection locally nor systemically at this time. [name], [name] ([id number]) [id number] Page 4 of 10 Electronic Signature(s) Signed: 01/19/2022 4:33:15 PM By: [name] PA-C Entered By: [name] on 01/19/2022 16:33:15 -------------------------------------------------------------------------------- Physical Exam Details Patient Name: Date of Service: [name], [name]. 01/19/2022 3:15 PM Medical Record Number: [id number] Patient Account Number: [id number] Date of Birth/Sex: Treating RN: [birth year] (67 y.o. M) Primary Care Provider: [name] Other Clinician: Referring Provider: Treating Provider/Extender: [name] in Treatment: 28 Constitutional Well-nourished and well-hydrated in no acute distress. Respiratory normal breathing without difficulty. Psychiatric this patient is able to make decisions  and demonstrates good insight into disease process. Alert and Oriented x 3. pleasant and cooperative. Notes Upon inspection patient's wound bed actually showed signs of good granulation epithelization at this point. Fortunately I see no signs of infection at this time which is great news and overall I am extremely pleased with where we stand today. Electronic Signature(s) Signed: 01/19/2022 4:33:29 PM By: [name] PA-C Entered By: [name] on  01/19/2022 16:33:29 -------------------------------------------------------------------------------- Physician Orders Details Patient Name: Date of Service: [name], [name] 01/19/2022 3:15 PM Medical Record Number: [id number] Patient Account Number: [id number] Date of Birth/Sex: Treating RN: Oct 10, 1955 (67 y.o. [name], [name] Primary Care Provider: [name] Other Clinician: Referring Provider: Treating Provider/Extender: [name] in Treatment: 24 Verbal / Phone Orders: No Diagnosis Coding ICD-10 Coding Code Description I89.0 Lymphedema, not elsewhere classified I87.333 Chronic venous hypertension (idiopathic) with ulcer and inflammation of bilateral lower extremity L97.828 Non-pressure chronic ulcer of other part of left lower leg with other specified severity L97.818 Non-pressure chronic ulcer of other part of right lower leg with other specified severity E11.621 Type 2 diabetes mellitus with foot ulcer L97.518 Non-pressure chronic ulcer of other part of right foot with other specified severity Follow-up Appointments ppointment in 1 week. Margarita Grizzle on Wednesday 01/26/2022 315pm Return A ppointment in 2 weeks. Margarita Grizzle on Wednesday 02/02/2022 315pm Return A RAIDYN, WASSINK [name] ([id number]) [id number] Page 5 of 10 Anesthetic (In clinic) Topical Lidocaine 5% applied to wound bed Bathing/ Shower/ Hygiene May shower with protection but do not get wound dressing(s) wet. Protect  dressing(s) with water repellant cover (for example, large plastic bag) or a cast cover and may then take shower. Edema Control - Lymphedema / SCD / Other Elevate legs to the level of the heart or above for 30 minutes daily and/or when sitting for 3-4 times a day throughout the day. Avoid standing for long periods of time. Exercise regularly Moisturize legs daily. - apply every night before bed to left leg. Compression stocking or Garment 30-40 mm/Hg pressure to: - wear your VIVE compression stocking to left leg. apply in the morning and remove at night. Will apply tubigrip size [name] to left leg. Remove once you get home and use your compression stockings. Wound Treatment Wound #7 - Malleolus Wound Laterality: Right, Medial Peri-Wound Care: Triamcinolone 15 (g) 1 x Per Week/30 Days Discharge Instructions: Use triamcinolone 15 (g) as directed Peri-Wound Care: Sween Lotion (Moisturizing lotion) 1 x Per Week/30 Days Discharge Instructions: Apply moisturizing lotion as directed Prim Dressing: PolyMem Silver Non-Adhesive Dressing, 4.25x4.25 in 1 x Per Week/30 Days ary Discharge Instructions: Apply to wound bed.****CUT SLITS IN THE POLYMEM PLEASE.**** Secondary Dressing: Zetuvit Plus 4x8 in 1 x Per Week/30 Days Discharge Instructions: Apply over primary dressing as directed. Compression Wrap: ThreePress (3 layer compression wrap) 1 x Per Week/30 Days Discharge Instructions: Apply three layer use kerlix instead of cotton compression as directed. Electronic Signature(s) Signed: 01/19/2022 4:42:12 PM By: [name] PA-C Signed: 01/19/2022 5:22:16 PM By: Deon Pilling RN, BSN Entered By: Deon Pilling on 01/19/2022 15:33:09 -------------------------------------------------------------------------------- Problem List Details Patient Name: Date of Service: [name], [name]. 01/19/2022 3:15 PM Medical Record Number: [id number] Patient Account Number: [id number] Date of Birth/Sex: Treating  RN: 18-May-1955 (67 y.o. M) Primary Care Provider: [name] Other Clinician: Referring Provider: Treating Provider/Extender: [name] in Treatment: 24 Active Problems ICD-10 Encounter Code Description Active Date MDM Diagnosis I89.0 Lymphedema, not elsewhere classified 08/04/2021 No Yes I87.333 Chronic venous hypertension (idiopathic) with ulcer and inflammation of 08/04/2021 No Yes bilateral lower extremity L97.828 Non-pressure chronic ulcer of other part of left lower leg with other specified 08/04/2021 No Yes severity L97.818 Non-pressure chronic ulcer of other part of right lower leg with other specified 08/04/2021 No Yes severity [name], [name] ([id number]) [id number] Page 6 of 10 E11.621 Type 2 diabetes mellitus with foot ulcer 08/04/2021 No  Yes L97.518 Non-pressure chronic ulcer of other part of right foot with other specified 08/04/2021 No Yes severity Inactive Problems Resolved Problems Electronic Signature(s) Signed: 01/19/2022 3:26:02 PM By: [name] PA-C Entered By: [name] on 01/19/2022 15:26:02 -------------------------------------------------------------------------------- Progress Note Details Patient Name: Date of Service: [name], [name]. 01/19/2022 3:15 PM Medical Record Number: [id number] Patient Account Number: [id number] Date of Birth/Sex: Treating RN: 1955/05/29 (67 y.o. M) Primary Care Provider: [name] Other Clinician: Referring Provider: Treating Provider/Extender: [name] in Treatment: 24 Subjective Chief Complaint Information obtained from Patient 08/04/2021; patient returns to clinic with bilateral leg wounds as well as areas on the right foot History of Present Illness (HPI) ADMISSION 03/22/2021 This is a 67 year old man with a past medical history significant for diabetes type 2, congestive heart failure, peripheral arterial disease, morbid  obesity, venous insufficiency, and coronary artery disease. He has been followed by [name] in podiatry, who performed a transmetatarsal amputation on the left foot in August 2022. He had issues healing that wound, but based upon Dr. [name] notes, ultimately the TMA wound healed. During his recovery from that surgery, however, ulcers opened up over the DIP joint of the right second and third toe. These have apparently closed and reopened multiple times. It sounds like one of the issues has been moisture accumulation and maceration of the tissues causing them to reopen. At his last visit with [name], on March 01, 2021, there continues to be problems with moisture and he was referred to wound care for further evaluation and management. He had a formal aortogram with runoff performed prior to his TMA. The findings are copied here: Patient has inline flow to both feet with no significant flow-limiting lesion that would be amenable to percutaneous or open revascularization. He does have an element of small vessel disease and has a short segment occlusion of the distal anterior tibial/dorsalis pedis artery on the left foot but does have posterior tibial artery flow. Would recommend management of wounds with amputation of toes 2 and 3 on the right foot if the wounds do not heal and deteriorate. Transmetatarsal amputation on the left side has as good a blood supply as it is going to get and hopefully this will heal in the future. Formal ABIs were done in January 2023. They are normal bilaterally. ABI Findings: +---------+------------------+-----+----------+--------+ Right Rt Pressure (mmHg)IndexWaveform Comment  +---------+------------------+-----+----------+--------+ Brachial 160    +---------+------------------+-----+----------+--------+ PTA 192 1.06 monophasic  +---------+------------------+-----+----------+--------+ DP 159 0.88 monophasic   +---------+------------------+-----+----------+--------+ Great T oe145 0.80    +---------+------------------+-----+----------+--------+ +--------+------------------+-----+---------+-------+ Left Lt Pressure (mmHg)IndexWaveform Comment +--------+------------------+-----+---------+-------+ [id number]    +--------+------------------+-----+---------+-------+ PTA 204 1.13 triphasic  [name], [name] ([id number]) [id number] Page 7 of 10 +--------+------------------+-----+---------+-------+ DP 194 1.07 biphasic   +--------+------------------+-----+---------+-------+ +-------+-----------+-----------+------------+------------+ ABI/TBIT oday's ABIT oday's TBIPrevious ABIPrevious TBI +-------+-----------+-----------+------------+------------+ Right 1.06 0.8 1.26 0.65  +-------+-----------+-----------+------------+------------+ Left 1.13 amputation 1.15 amputation  +-------+-----------+-----------+------------+------------+ Previous ABI on 08/06/20 at [facility] Pedal pressures falsely elevated due to medial calcification. Summary: Right: Resting right ankle-brachial index is within normal range. The right toe-brachial index is normal. Left: Resting left ankle-brachial index is within normal range. READMISSION 08/04/2021 Mr. Bluemel is now a 67 year old man who I remember from this clinic many years ago I think he had a right lower extremity predominantly venous wound at the time. He was here for 1 visit in March of this year had wounds on his right second and third toes we apparently dressed them many and they healed so he  did not come back. He is listed in [place] is being a diabetic although the patient denies this says he is verified it with his primary doctor. In any case over the last several weeks or so according the patient although these wounds look somewhat more chronic than that he has developed predominantly  large wounds on the right medial and right lateral ankle smaller areas on the left leg and areas on the dorsal aspect of his right second and third toes. Its not clear how he has been dressing these. 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 The patient has known PAD. He had an angiogram in August 2022 at which time he had nonhealing wounds in both feet. On the left his major vessels in the thigh were all patent. He had three-vessel patent to the level of the ankle. He had a very short occlusion in the left anterior tibial. On the right lower extremity the major vessels in his thigh were all patent. He had three-vessel runoff to the foot sluggish filling of the anterior tibial artery. He was felt to have some component of small vessel disease but nothing that was amenable or needed revascularization. It was recommended that he have amputation of the second and third toes on the right foot if they did not heal He has been following with [name] of podiatry. [name] got him juxta lite stockings although I do not think he had them on properly he has uncontrolled edema in both legs Past medical history includes type 2 diabetes [although the patient really denies this], left TMA in 2022,lower extremity wounds in fact attendance at this clinic in 2009-2010, A-fib on Eliquis, chronic venous insufficiency with secondary lymphedema history of non-Hodgkin's lymphoma. 08-11-2021 upon evaluation today patient presents for follow-up evaluation he was seen last Wednesday initially for inspection here in our clinic. With that being said he tells me that he unfortunately has been having a lot of drainage and is actually coming through his wrap. Fortunately I do not see any evidence of active infection locally or systemically at this time which is great news. No fevers, chills, nausea, vomiting, or diarrhea. With that being said there  does appear to be some evidence of local infection based on what I am seeing today. 08-18-2021 upon evaluation today patient appears to be doing okay currently in regard to his wounds with that being said that he is doing much better but still has a long ways to go to get to where he wanted to be. I think the infection is significantly improved. He has another week of the antibiotic at this point. 08-25-2021 upon evaluation today patient's wounds are actually doing decently well he has erythema has significantly improved. I think the cellulitis is under controlled I am going to continue him on 2 more weeks of the Levaquin at 500 mg this is a lower dose but I am hoping it will be better for him. 09-08-2021 upon evaluation today patient appears to be doing excellent in regard to his wounds. Since I last saw him he was actually in the hospital where he ended up having a pacemaker put in. Subsequently he tells me that he is actually doing quite well although they were unsure whether they were going to do it due to the fact that he had the wounds on his legs. And then I am glad they did anything seem to be doing  well. 09-15-2021 upon evaluation patient's wounds are actually showing signs of improvement. The right side wounds do appear to have some need for sharp debridement today and I Georgina Peer go ahead and proceed with that. I think that if we get the wounds cleaned up he will actually show signs of continued improvement. I am also leaning towards switching to [facility] which I think will be a much better option for him. 09-22-2021 patient's wounds are showing signs of excellent improvement. I am actually extremely pleased with where we stand and I think that the patient is making great progress. There does not appear to be any signs of active infection. 09-29-2021 upon evaluation today patient appears to be doing excellent in regard to his wounds. He is actually tolerating the dressing changes  without complication. Fortunately I see no evidence of active infection locally or systemically at this time which is great news and overall I am extremely pleased with where we stand currently. 09-2721 upon evaluation today patient actually appears to be doing excellent in regard to his wounds. The left leg is almost completely healed the right ankle is significantly smaller. Overall I am extremely pleased with where we stand at this point. I do not see any evidence of active infection at this time. 10-13-2021 upon evaluation today patient appears to be doing excellent in regard to his wounds. I really feel like he is making good progress here and I am very pleased in that regard. Fortunately I do not see any signs of active infection at this time. We are using the [facility] topical antibiotic therapy. 10-20-2021 upon evaluation today patient appears to be doing well with regard to his wound on the right medial ankle region the left leg is almost completely healed. I am actually very pleased with where we stand today. 11-03-2021 upon evaluation today patient's wound actually is going require some sharp debridement but appears to be doing much better which is great news. Fortunately I do not see any signs of active infection at this time. 11-10-2021 upon evaluation today patient's wound is actually showing signs of excellent improvement. Fortunately I do not see any evidence of infection locally or systemically which is great news and overall I am extremely pleased with where we stand today. I do believe he is making good progress he does have his [facility] topical antibiotics with him here today. 11-24-2021 upon evaluation today patient actually showing signs of excellent improvement this appears to be doing much better. Fortunately I do not see any evidence of infection locally or systemically at this time. [name], [name] ([id number]) [id number] Page 8 of 10 12-01-2021 upon  evaluation today patient appears to be doing well currently in regard to his wound. He has been tolerating the dressing changes without complication. Fortunately I do not see any evidence of active infection at this time which is great news and overall I am extremely pleased with where we stand today. 12-15-2021 upon evaluation today patient appears to be doing well with regard to his wound. He is showing signs of improvement is slow but nonetheless we are making improvements here. Fortunately I do not see any evidence of active infection locally nor systemically at this time. We are still using the [facility] topical antibiotic over the open area only. 12-29-2021 upon evaluation today patient's wound actually is showing signs of excellent improvement. It has been 2 weeks since I perform any debridement and it definitely shows he has some tissue that needs to be cleaned away but  I think we can do so quite easily and readily today. The good news is I do not see any signs of infection I think he is doing much better in that regard. Overall I am extremely pleased with where we stand. 01-12-2022 upon evaluation today patient appears to be doing well currently in regard to his wound although it is not getting significantly smaller it is also not getting any larger. Fortunately I do not see any evidence of infection locally nor systemically which is great news and overall I am extremely pleased with where things stand currently. 01-19-2022 upon evaluation today patient appears to be doing well currently in regard to his wound. The PolyMem actually seems to have done extremely well for him. Fortunately I do not see any signs of infection locally nor systemically at this time. Objective Constitutional Well-nourished and well-hydrated in no acute distress. Vitals Time Taken: 3:02 PM, Height: 71 in, Weight: 350 lbs, BMI: 48.8, Temperature: 98 F, Pulse: 86 bpm, Respiratory Rate: 18 breaths/min, Blood  Pressure: 142/88 mmHg. Respiratory normal breathing without difficulty. Psychiatric this patient is able to make decisions and demonstrates good insight into disease process. Alert and Oriented x 3. pleasant and cooperative. General Notes: Upon inspection patient's wound bed actually showed signs of good granulation epithelization at this point. Fortunately I see no signs of infection at this time which is great news and overall I am extremely pleased with where we stand today. Integumentary (Hair, Skin) Wound #7 status is Open. Original cause of wound was Blister. The date acquired was: 07/21/2021. The wound has been in treatment 24 weeks. The wound is located on the Right,Medial Malleolus. The wound measures 4.3cm length x 2cm width x 0.2cm depth; 6.754cm^2 area and 1.351cm^3 volume. There is no tunneling or undermining noted. There is a medium amount of serosanguineous drainage noted. There is large (67-100%) red granulation within the wound bed. There is a small (1-33%) amount of necrotic tissue within the wound bed including Adherent Slough. The periwound skin appearance exhibited: Dry/Scaly. The periwound skin appearance did not exhibit: Callus, Crepitus, Excoriation, Induration, Rash, Scarring, Maceration, Atrophie Blanche, Cyanosis, Ecchymosis, Hemosiderin Staining, Mottled, Pallor, Rubor, Erythema. Assessment Active Problems ICD-10 Lymphedema, not elsewhere classified Chronic venous hypertension (idiopathic) with ulcer and inflammation of bilateral lower extremity Non-pressure chronic ulcer of other part of left lower leg with other specified severity Non-pressure chronic ulcer of other part of right lower leg with other specified severity Type 2 diabetes mellitus with foot ulcer Non-pressure chronic ulcer of other part of right foot with other specified severity Procedures Wound #7 Pre-procedure diagnosis of Wound #7 is a Venous Leg Ulcer located on the Right,Medial Malleolus  .Severity of Tissue Pre Debridement is: Fat layer exposed. There was a Excisional Skin/Subcutaneous Tissue Debridement with a total area of 8.6 sq cm performed by [name], PA. With the following instrument(s): Curette to remove Viable and Non-Viable tissue/material. Material removed includes Subcutaneous Tissue, Slough, Skin: Dermis, and Skin: Epidermis after achieving pain control using Lidocaine 4% Topical Solution. A time out was conducted at 15:25, prior to the start of the procedure. A Minimum amount of bleeding was controlled with Pressure. The procedure was tolerated well with a pain level of 0 throughout and a pain level of 0 following the procedure. Post Debridement Measurements: 4.3cm length x 2cm width x 0.2cm depth; 1.351cm^3 volume. Character of Wound/Ulcer Post Debridement is improved. Severity of Tissue Post Debridement is: Fat layer exposed. Post procedure Diagnosis Wound #7: Same as Pre-Procedure  AEON, KESSNER ([id number]) [id number] Page 9 of 10 Pre-procedure diagnosis of Wound #7 is a Venous Leg Ulcer located on the Right,Medial Malleolus . There was a Three Layer Compression Therapy Procedure by Deon Pilling, RN. Post procedure Diagnosis Wound #7: Same as Pre-Procedure Plan Follow-up Appointments: Return Appointment in 1 week. Margarita Grizzle on Wednesday 01/26/2022 315pm Return Appointment in 2 weeks. Margarita Grizzle on Wednesday 02/02/2022 315pm Anesthetic: (In clinic) Topical Lidocaine 5% applied to wound bed Bathing/ Shower/ Hygiene: May shower with protection but do not get wound dressing(s) wet. Protect dressing(s) with water repellant cover (for example, large plastic bag) or a cast cover and may then take shower. Edema Control - Lymphedema / SCD / Other: Elevate legs to the level of the heart or above for 30 minutes daily and/or when sitting for 3-4 times a day throughout the day. Avoid standing for long periods of time. Exercise  regularly Moisturize legs daily. - apply every night before bed to left leg. Compression stocking or Garment 30-40 mm/Hg pressure to: - wear your VIVE compression stocking to left leg. apply in the morning and remove at night. Will apply tubigrip size [name] to left leg. Remove once you get home and use your compression stockings. WOUND #7: - Malleolus Wound Laterality: Right, Medial Peri-Wound Care: Triamcinolone 15 (g) 1 x Per Week/30 Days Discharge Instructions: Use triamcinolone 15 (g) as directed Peri-Wound Care: Sween Lotion (Moisturizing lotion) 1 x Per Week/30 Days Discharge Instructions: Apply moisturizing lotion as directed Prim Dressing: PolyMem Silver Non-Adhesive Dressing, 4.25x4.25 in 1 x Per Week/30 Days ary Discharge Instructions: Apply to wound bed.****CUT SLITS IN THE POLYMEM PLEASE.**** Secondary Dressing: Zetuvit Plus 4x8 in 1 x Per Week/30 Days Discharge Instructions: Apply over primary dressing as directed. Com pression Wrap: ThreePress (3 layer compression wrap) 1 x Per Week/30 Days Discharge Instructions: Apply three layer use kerlix instead of cotton compression as directed. 1. I am going to suggest that we have the patient continue to monitor for any signs of infection or worsening. Obviously if anything changes he knows contact the office let me know. 2. Also can the suggest that the patient should elevate his legs much as possible he does work but outside of that when he is home I do think elevation would be the way to go. We will see patient back for reevaluation in 1 week here in the clinic. If anything worsens or changes patient will contact our office for additional recommendations. Electronic Signature(s) Signed: 01/19/2022 4:34:10 PM By: [name] PA-C Entered By: [name] on 01/19/2022 16:34:10 -------------------------------------------------------------------------------- SuperBill Details Patient Name: Date of Service: Kaeser, [name].  01/19/2022 Medical Record Number: [id number] Patient Account Number: [id number] Date of Birth/Sex: Treating RN: 1955-11-27 (67 y.o. [name], [name] Primary Care Provider: [name] Other Clinician: Referring Provider: Treating Provider/Extender: [name] in Treatment: 24 Diagnosis Coding ICD-10 Codes Code Description I89.0 Lymphedema, not elsewhere classified I87.333 Chronic venous hypertension (idiopathic) with ulcer and inflammation of bilateral lower extremity L97.828 Non-pressure chronic ulcer of other part of left lower leg with other specified severity L97.818 Non-pressure chronic ulcer of other part of right lower leg with other specified severity E11.621 Type 2 diabetes mellitus with foot ulcer L97.518 Non-pressure chronic ulcer of other part of right foot with other specified severity EUGEN, JEANSONNE [name] ([id number]) [id number] Page 10 of 10 Facility Procedures : 3 CPT4 Code: [id number] Description: [place] SUBQ TISSUE 20 SQ CM/< ICD-10 Diagnosis Description L97.818 Non-pressure  chronic ulcer of other part of right lower leg with other specified Modifier: severity Quantity: 1 Physician Procedures : CPT4 Code Description Modifier [id number] - WC PHYS SUBQ TISS 20 SQ CM ICD-10 Diagnosis Description L97.818 Non-pressure chronic ulcer of other part of right lower leg with other specified severity Quantity: 1 Electronic Signature(s) Signed: 01/19/2022 4:42:12 PM By: [name] PA-C Signed: 01/19/2022 5:22:16 PM By: Deon Pilling RN, BSN Entered By: Deon Pilling on 01/19/2022 15:33:43

## 2022-01-20 NOTE — Progress Notes (Addendum)
JAJUAN, SKOOG ([id number]) [id number] Page 1 of 6 Visit Report for 01/19/2022 Arrival Information Details Patient Name: Date of Service: [name], [name] 01/19/2022 3:15 PM Medical Record Number: [id number] Patient Account Number: [id number] Date of Birth/Sex: Treating RN: [birth year] (67 y.o. M) Primary Care Jansen Goodpasture: [name] Other Clinician: Referring Dashauna Heymann: Treating Janazia Schreier/Extender: [name] in Treatment: 24 Visit Information History Since Last Visit Added or deleted any medications: No Patient Arrived: [name] Any new allergies or adverse reactions: No Arrival Time: 14:59 Had a fall or experienced change in No Accompanied By: self activities of daily living that may affect Transfer Assistance: None risk of falls: Patient Identification Verified: Yes Signs or symptoms of abuse/neglect since last visito No Secondary Verification Process Completed: Yes Hospitalized since last visit: No Patient Requires Transmission-Based Precautions: No Implantable device outside of the clinic excluding No Patient Has Alerts: Yes cellular tissue based products placed in the center Patient Alerts: Patient on Blood Thinner since last visit: 01/2021 ABI L 1.13 R 1.06 Has Compression in Place as Prescribed: Yes 01/2021 TBI L amp R 0.8 Pain Present Now: No Electronic Signature(s) Signed: 01/19/2022 4:41:43 PM By: Erenest Blank Entered By: Erenest Blank on 01/19/2022 15:02:11 -------------------------------------------------------------------------------- Complex / Palliative Patient Assessment Details Patient Name: Date of Service: [name], [name]. 01/19/2022 3:15 PM Medical Record Number: [id number] Patient Account Number: [id number] Date of Birth/Sex: Treating RN: November 07, 1955 (67 y.o. [name] Primary Care Theadora Noyes: [name] Other Clinician: Referring Nahara Dona: Treating Nochum Fenter/Extender: [name] in Treatment: 24 Complex Wound Management Criteria Patient has remarkable or complex co-morbidities requiring medications or treatments that extend wound healing times. Examples: Diabetes mellitus with chronic renal failure or end stage renal disease requiring dialysis Advanced or poorly controlled rheumatoid arthritis Diabetes mellitus and end stage chronic obstructive pulmonary disease Active cancer with current chemo- or radiation therapy CHF, pacemaker, afib, type II diabetes Palliative Wound Management Criteria Care Approach Wound Care Plan: Complex Wound Management Electronic Signature(s) Signed: 01/21/2022 4:34:42 PM By: Adline Peals Signed: 02/09/2022 4:50:55 PM By: [name] PA-C Entered By: Adline Peals on 01/21/2022 16:34:41 [name] ([id number]) [id number] Page 2 of 6 -------------------------------------------------------------------------------- Compression Therapy Details Patient Name: Date of Service: [name], [name] 01/19/2022 3:15 PM Medical Record Number: [id number] Patient Account Number: [id number] Date of Birth/Sex: Treating RN: 06-24-1955 (67 y.o. [name] Primary Care Cyanne Delmar: [name] Other Clinician: Referring Everli Rother: Treating Areta Terwilliger/Extender: [name] in Treatment: 24 Compression Therapy Performed for Wound Assessment: Wound #7 Right,Medial Malleolus Performed By: Clinician Deon Pilling, RN Compression Type: Three Layer Post Procedure Diagnosis Same as Pre-procedure Electronic Signature(s) Signed: 01/19/2022 5:22:16 PM By: Deon Pilling RN, BSN Entered By: Deon Pilling on 01/19/2022 15:32:09 -------------------------------------------------------------------------------- Encounter Discharge Information Details Patient Name: Date of Service: [name], [name] [name]. 01/19/2022 3:15 PM Medical Record Number: [id number] Patient Account Number: [id number] Date  of Birth/Sex: Treating RN: [birth year] (67 y.o. [name] Primary Care Rochella Benner: [name] Other Clinician: Referring Pattie Flaharty: Treating Saleema Weppler/Extender: [name] in Treatment: 24 Encounter Discharge Information Items Post Procedure Vitals Discharge Condition: Stable Temperature (F): 98 Ambulatory Status: Walker Pulse (bpm): 68 Discharge Destination: Home Respiratory Rate (breaths/min): 20 Transportation: Private Auto Blood Pressure (mmHg): 142/88 Accompanied By: self Schedule Follow-up Appointment: Yes Clinical Summary of Care: Electronic Signature(s) Signed: 01/19/2022 5:22:16 PM By: Deon Pilling RN, BSN Entered By: Deon Pilling on 01/19/2022 15:34:34 -------------------------------------------------------------------------------- Lower Extremity Assessment Details  Patient Name: Date of Service: [name], [name] 01/19/2022 3:15 PM Medical Record Number: [id number] Patient Account Number: [id number] Date of Birth/Sex: Treating RN: 02/15/1955 (67 y.o. M) Primary Care Tabatha Razzano: [name] Other Clinician: Referring Alixis Harmon: Treating Safir Michalec/Extender: [name] in Treatment: 24 Edema Assessment Left: [Left: Right] [name]: :] Assessed: [Left: No] [Right: No] Edema: [Left: N] [Right: o] Calf Left: Right: Point of Measurement: 31 cm From Medial Instep 40 cm Ankle Left: Right: Point of Measurement: 11 cm From Medial Instep 27 cm Electronic Signature(s) Signed: 01/19/2022 4:41:43 PM By: Erenest Blank Entered By: Erenest Blank on 01/19/2022 15:10:04 -------------------------------------------------------------------------------- Multi-Disciplinary Care Plan Details Patient Name: Date of Service: [name]. 01/19/2022 3:15 PM Medical Record Number: [id number] Patient Account Number: [id number] Date of Birth/Sex: Treating RN: [birth year] (67 y.o. [name] Primary Care Jillane Po: [name] Other  Clinician: Referring Joshuajames Moehring: Treating Dreya Buhrman/Extender: [name] in Treatment: [place] reviewed with physician Active Inactive Pain, Acute or Chronic Nursing Diagnoses: Pain, acute or chronic: actual or potential Potential alteration in comfort, pain Goals: Patient will verbalize adequate pain control and receive pain control interventions during procedures as needed Date Initiated: 08/04/2021 Target Resolution Date: 02/04/2022 Goal Status: Active Patient/caregiver will verbalize comfort level met Date Initiated: 08/04/2021 Target Resolution Date: 02/11/2022 Goal Status: Active Interventions: Encourage patient to take pain medications as prescribed Provide education on pain management Reposition patient for comfort Treatment Activities: Administer pain control measures as ordered : 08/04/2021 Notes: Electronic Signature(s) Signed: 01/19/2022 5:22:16 PM By: Deon Pilling RN, BSN Entered By: Deon Pilling on 01/19/2022 15:29:53 Simeone, [name] ([id number]) [id number] Page 4 of 6 -------------------------------------------------------------------------------- Pain Assessment Details Patient Name: Date of Service: [name], [name] 01/19/2022 3:15 PM Medical Record Number: [id number] Patient Account Number: [id number] Date of Birth/Sex: Treating RN: 01/25/1955 (67 y.o. M) Primary Care Linkin Vizzini: [name] Other Clinician: Referring Yamira Papa: Treating Martino Tompson/Extender: [name] in Treatment: 24 Active Problems Location of Pain Severity and Description of Pain Patient Has Paino No Site Locations Pain Management and Medication Current Pain Management: Electronic Signature(s) Signed: 01/19/2022 4:41:43 PM By: Erenest Blank Entered By: Erenest Blank on 01/19/2022 15:02:49 -------------------------------------------------------------------------------- Patient/Caregiver  Education Details Patient Name: Date of Service: [name] 1/10/2024andnbsp3:15 PM Medical Record Number: [id number] Patient Account Number: [id number] Date of Birth/Gender: Treating RN: [birth year] (67 y.o. [name] Primary Care Physician: [name] Other Clinician: Referring Physician: Treating Physician/Extender: [name] in Treatment: 24 Education Assessment Education Provided To: Patient Education Topics Provided Wound/Skin Impairment: Handouts: Caring for Your Ulcer Methods: Explain/Verbal Responses: Reinforcements needed Electronic Signature(s) Signed: 01/19/2022 5:22:16 PM By: Deon Pilling RN, BSN [place], [name] ([id number]) [id number] Page 5 of 6 Entered By: Deon Pilling on 01/19/2022 15:30:07 -------------------------------------------------------------------------------- Wound Assessment Details Patient Name: Date of Service: [name], [name] 01/19/2022 3:15 PM Medical Record Number: [id number] Patient Account Number: [id number] Date of Birth/Sex: Treating RN: Jun 27, 1955 (67 y.o. M) Primary Care Shalamar Crays: [name] Other Clinician: Referring Sebastian Dzik: Treating Edker Punt/Extender: [name] in Treatment: 24 Wound Status Wound Number: [id number] Primary Venous Leg Ulcer Etiology: Wound Location: Right, Medial Malleolus Secondary Lymphedema Wounding Event: Blister Etiology: Date Acquired: 07/21/2021 Wound Open Weeks Of Treatment: 24 Status: Clustered Wound: No Comorbid Sleep Apnea, Arrhythmia, Congestive Heart Failure, History: Hypertension, Hypotension, Peripheral Arterial Disease, Peripheral Venous Disease, Type II Diabetes, Gout, Osteoarthritis Photos Wound Measurements Length: (cm) 4.3 Width: (cm)  2 Depth: (cm) 0.2 Area: (cm) 6.754 Volume: (cm) 1.351 % Reduction in Area: 82.4% % Reduction in Volume: 95% Tunneling: No Undermining: No Wound  Description Classification: Full Thickness With Exposed Suppo Exudate Amount: Medium Exudate Type: Serosanguineous Exudate Color: red, brown rt Structures Foul Odor After Cleansing: No Slough/Fibrino No Wound Bed Granulation Amount: Large (67-100%) Granulation Quality: Red Necrotic Amount: Small (1-33%) Necrotic Quality: Adherent Slough Periwound Skin Texture Texture Color No Abnormalities Noted: No No Abnormalities Noted: No Callus: No Atrophie Blanche: No Crepitus: No Cyanosis: No Excoriation: No Ecchymosis: No Induration: No Erythema: No Rash: No Hemosiderin Staining: No Scarring: No Mottled: No Pallor: No Moisture Rubor: No No Abnormalities Noted: No Dry / ScalyHARROL, [name] ([id number]) [id number] Page 6 of 6 Maceration: No Electronic Signature(s) Signed: 01/19/2022 4:41:43 PM By: Erenest Blank Entered By: Erenest Blank on 01/19/2022 15:12:04 -------------------------------------------------------------------------------- Vitals Details Patient Name: Date of Service: [name], [name] [name]. 01/19/2022 3:15 PM Medical Record Number: [id number] Patient Account Number: [id number] Date of Birth/Sex: Treating RN: Dec 26, 1955 (67 y.o. M) Primary Care Jereme Loren: [name] Other Clinician: Referring Capri Veals: Treating Coby Antrobus/Extender: [name] in Treatment: 24 Vital Signs Time Taken: 15:02 Temperature (F): 98 Height (in): 71 Pulse (bpm): 86 Weight (lbs): 350 Respiratory Rate (breaths/min): 18 Body Mass Index (BMI): 48.8 Blood Pressure (mmHg): 142/88 Reference Range: 80 - 120 mg / dl Electronic Signature(s) Signed: 01/19/2022 4:41:43 PM By: Erenest Blank Entered By: Erenest Blank on 01/19/2022 15:02:41

## 2022-01-26 ENCOUNTER — Encounter (HOSPITAL_BASED_OUTPATIENT_CLINIC_OR_DEPARTMENT_OTHER): Payer: Medicare Other | Admitting: Physician Assistant

## 2022-01-26 DIAGNOSIS — Z7901 Long term (current) use of anticoagulants: Secondary | ICD-10-CM | POA: Diagnosis not present

## 2022-01-26 DIAGNOSIS — I87333 Chronic venous hypertension (idiopathic) with ulcer and inflammation of bilateral lower extremity: Secondary | ICD-10-CM | POA: Diagnosis not present

## 2022-01-26 DIAGNOSIS — I110 Hypertensive heart disease with heart failure: Secondary | ICD-10-CM | POA: Diagnosis not present

## 2022-01-26 DIAGNOSIS — L97518 Non-pressure chronic ulcer of other part of right foot with other specified severity: Secondary | ICD-10-CM | POA: Diagnosis not present

## 2022-01-26 DIAGNOSIS — E1151 Type 2 diabetes mellitus with diabetic peripheral angiopathy without gangrene: Secondary | ICD-10-CM | POA: Diagnosis not present

## 2022-01-26 DIAGNOSIS — M109 Gout, unspecified: Secondary | ICD-10-CM | POA: Diagnosis not present

## 2022-01-26 DIAGNOSIS — L97828 Non-pressure chronic ulcer of other part of left lower leg with other specified severity: Secondary | ICD-10-CM | POA: Diagnosis not present

## 2022-01-26 DIAGNOSIS — L97312 Non-pressure chronic ulcer of right ankle with fat layer exposed: Secondary | ICD-10-CM | POA: Diagnosis not present

## 2022-01-26 DIAGNOSIS — E11621 Type 2 diabetes mellitus with foot ulcer: Secondary | ICD-10-CM | POA: Diagnosis not present

## 2022-01-26 DIAGNOSIS — I872 Venous insufficiency (chronic) (peripheral): Secondary | ICD-10-CM | POA: Diagnosis not present

## 2022-01-26 DIAGNOSIS — I890 Lymphedema, not elsewhere classified: Secondary | ICD-10-CM | POA: Diagnosis not present

## 2022-01-26 DIAGNOSIS — I2510 Atherosclerotic heart disease of native coronary artery without angina pectoris: Secondary | ICD-10-CM | POA: Diagnosis not present

## 2022-01-26 DIAGNOSIS — I509 Heart failure, unspecified: Secondary | ICD-10-CM | POA: Diagnosis not present

## 2022-01-26 DIAGNOSIS — I87331 Chronic venous hypertension (idiopathic) with ulcer and inflammation of right lower extremity: Secondary | ICD-10-CM | POA: Diagnosis not present

## 2022-01-26 DIAGNOSIS — L97818 Non-pressure chronic ulcer of other part of right lower leg with other specified severity: Secondary | ICD-10-CM | POA: Diagnosis not present

## 2022-01-26 NOTE — Progress Notes (Addendum)
[name] ([id number]) [id number] Page 1 of 9 Visit Report for 01/26/2022 Chief Complaint Document Details Patient Name: Date of Service: [name], [name] 01/26/2022 3:15 PM Medical Record Number: [id number] Patient Account Number: [id number] Date of Birth/Sex: Treating RN: May 24, 1955 (67 y.o. M) Primary Care Provider: [name] Other Clinician: Referring Provider: Treating Provider/Extender: [name] in Treatment: 25 Information Obtained from: Patient Chief Complaint 08/04/2021; patient returns to clinic with bilateral leg wounds as well as areas on the right foot Electronic Signature(s) Signed: 01/26/2022 3:20:28 PM By: [name] PA-C Entered By: [name] on 01/26/2022 15:20:28 -------------------------------------------------------------------------------- HPI Details Patient Name: Date of Service: [name], [name]. 01/26/2022 3:15 PM Medical Record Number: [id number] Patient Account Number: [id number] Date of Birth/Sex: Treating RN: [birth year] (67 y.o. M) Primary Care Provider: [name] Other Clinician: Referring Provider: Treating Provider/Extender: [name] in Treatment: 25 History of Present Illness HPI Description: ADMISSION 03/22/2021 This is a 67 year old man with a past medical history significant for diabetes type 2, congestive heart failure, peripheral arterial disease, morbid obesity, venous insufficiency, and coronary artery disease. He has been followed by [name] in podiatry, who performed a transmetatarsal amputation on the left foot in August 2022. He had issues healing that wound, but based upon Dr. [name] notes, ultimately the TMA wound healed. During his recovery from that surgery, however, ulcers opened up over the DIP joint of the right second and third toe. These have apparently closed and reopened multiple times. It sounds like one of the issues has been  moisture accumulation and maceration of the tissues causing them to reopen. At his last visit with [name], on March 01, 2021, there continues to be problems with moisture and he was referred to wound care for further evaluation and management. He had a formal aortogram with runoff performed prior to his TMA. The findings are copied here: Patient has inline flow to both feet with no significant flow-limiting lesion that would be amenable to percutaneous or open revascularization. He does have an element of small vessel disease and has a short segment occlusion of the distal anterior tibial/dorsalis pedis artery on the left foot but does have posterior tibial artery flow. Would recommend management of wounds with amputation of toes 2 and 3 on the right foot if the wounds do not heal and deteriorate. Transmetatarsal amputation on the left side has as good a blood supply as it is going to get and hopefully this will heal in the future. Formal ABIs were done in January 2023. They are normal bilaterally. ABI Findings: +---------+------------------+-----+----------+--------+ Right Rt Pressure (mmHg)IndexWaveform Comment  +---------+------------------+-----+----------+--------+ Brachial 160     +---------+------------------+-----+----------+--------+ PTA 192 1.06 monophasic  +---------+------------------+-----+----------+--------+ DP 159 0.88 monophasic  +---------+------------------+-----+----------+--------+ Great T oe145 0.80    +---------+------------------+-----+----------+--------+ +--------+------------------+-----+---------+-------+ [name] ([id number]) [id number] Page 2 of 9 Left Lt Pressure (mmHg)IndexWaveform Comment +--------+------------------+-----+---------+-------+ [id number]     +--------+------------------+-----+---------+-------+ PTA 204 1.13 triphasic   +--------+------------------+-----+---------+-------+ DP 194 1.07 biphasic   +--------+------------------+-----+---------+-------+ +-------+-----------+-----------+------------+------------+ ABI/TBIT oday's ABIT oday's TBIPrevious ABIPrevious TBI +-------+-----------+-----------+------------+------------+ Right 1.06 0.8 1.26 0.65  +-------+-----------+-----------+------------+------------+ Left 1.13 amputation 1.15 amputation  +-------+-----------+-----------+------------+------------+ Previous ABI on 08/06/20 at [facility] Pedal pressures falsely elevated due to medial calcification. Summary: Right: Resting right ankle-brachial index is within normal range. The right toe-brachial index is normal. Left: Resting left ankle-brachial index is within normal range. READMISSION 08/04/2021 [name] is now a 67 year old man who I remember from this clinic many years  ago I think he had a right lower extremity predominantly venous wound at the time. He was here for 1 visit in March of this year had wounds on his right second and third toes we apparently dressed them many and they healed so he did not come back. He is listed in [place] is being a diabetic although the patient denies this says he is verified it with his primary doctor. In any case over the last several weeks or so according the patient although these wounds look somewhat more chronic than that he has developed predominantly large wounds on the right medial and right lateral ankle smaller areas on the left leg and areas on the dorsal aspect of his right second and third toes. Its not clear how he has been dressing these. 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 The patient has known PAD. He had an angiogram in August 2022 at which time he had nonhealing wounds in both feet. On the left his major vessels in the thigh were all  patent. He had three-vessel patent to the level of the ankle. He had a very short occlusion in the left anterior tibial. On the right lower extremity the major vessels in his thigh were all patent. He had three-vessel runoff to the foot sluggish filling of the anterior tibial artery. He was felt to have some component of small vessel disease but nothing that was amenable or needed revascularization. It was recommended that he have amputation of the second and third toes on the right foot if they did not heal He has been following with [name] of podiatry. [name] got him juxta lite stockings although I do not think he had them on properly he has uncontrolled edema in both legs Past medical history includes type 2 diabetes [although the patient really denies this], left TMA in 2022,lower extremity wounds in fact attendance at this clinic in 2009-2010, A-fib on Eliquis, chronic venous insufficiency with secondary lymphedema history of non-Hodgkin's lymphoma. 08-11-2021 upon evaluation today patient presents for follow-up evaluation he was seen last Wednesday initially for inspection here in our clinic. With that being said he tells me that he unfortunately has been having a lot of drainage and is actually coming through his wrap. Fortunately I do not see any evidence of active infection locally or systemically at this time which is great news. No fevers, chills, nausea, vomiting, or diarrhea. With that being said there does appear to be some evidence of local infection based on what I am seeing today. 08-18-2021 upon evaluation today patient appears to be doing okay currently in regard to his wounds with that being said that he is doing much better but still has a long ways to go to get to where he wanted to be. I think the infection is significantly improved. He has another week of the antibiotic at this point. 08-25-2021 upon evaluation today patient's wounds are actually doing decently well he has  erythema has significantly improved. I think the cellulitis is under controlled I am going to continue him on 2 more weeks of the Levaquin at 500 mg this is a lower dose but I am hoping it will be better for him. 09-08-2021 upon evaluation today patient appears to be doing excellent in regard to his wounds. Since I last saw him he was actually in the hospital where he ended up having a pacemaker put in. Subsequently  he tells me that he is actually doing quite well although they were unsure whether they were going to do it due to the fact that he had the wounds on his legs. And then I am glad they did anything seem to be doing well. 09-15-2021 upon evaluation patient's wounds are actually showing signs of improvement. The right side wounds do appear to have some need for sharp debridement today and I Georgina Peer go ahead and proceed with that. I think that if we get the wounds cleaned up he will actually show signs of continued improvement. I am also leaning towards switching to [facility] which I think will be a much better option for him. 09-22-2021 patient's wounds are showing signs of excellent improvement. I am actually extremely pleased with where we stand and I think that the patient is making great progress. There does not appear to be any signs of active infection. 09-29-2021 upon evaluation today patient appears to be doing excellent in regard to his wounds. He is actually tolerating the dressing changes without complication. Fortunately I see no evidence of active infection locally or systemically at this time which is great news and overall I am extremely pleased with where we stand currently. 09-2721 upon evaluation today patient actually appears to be doing excellent in regard to his wounds. The left leg is almost completely healed the right ankle is significantly smaller. Overall I am extremely pleased with where we stand at this point. I do not see any evidence of active infection at this  time. 10-13-2021 upon evaluation today patient appears to be doing excellent in regard to his wounds. I really feel like he is making good progress here and I am very pleased in that regard. Fortunately I do not see any signs of active infection at this time. We are using the [facility] topical antibiotic therapy. 10-20-2021 upon evaluation today patient appears to be doing well with regard to his wound on the right medial ankle region the left leg is almost completely healed. I am actually very pleased with where we stand today. 11-03-2021 upon evaluation today patient's wound actually is going require some sharp debridement but appears to be doing much better which is great news. Fortunately I do not see any signs of active infection at this time. 11-10-2021 upon evaluation today patient's wound is actually showing signs of excellent improvement. Fortunately I do not see any evidence of infection locally [name], [name] ([id number]) [id number] Page 3 of 9 or systemically which is great news and overall I am extremely pleased with where we stand today. I do believe he is making good progress he does have his [facility] topical antibiotics with him here today. 11-24-2021 upon evaluation today patient actually showing signs of excellent improvement this appears to be doing much better. Fortunately I do not see any evidence of infection locally or systemically at this time. 12-01-2021 upon evaluation today patient appears to be doing well currently in regard to his wound. He has been tolerating the dressing changes without complication. Fortunately I do not see any evidence of active infection at this time which is great news and overall I am extremely pleased with where we stand today. 12-15-2021 upon evaluation today patient appears to be doing well with regard to his wound. He is showing signs of improvement is slow but nonetheless we are making improvements here. Fortunately I  do not see any evidence of active infection locally nor systemically at this time. We are still using the [facility]  topical antibiotic over the open area only. 12-29-2021 upon evaluation today patient's wound actually is showing signs of excellent improvement. It has been 2 weeks since I perform any debridement and it definitely shows he has some tissue that needs to be cleaned away but I think we can do so quite easily and readily today. The good news is I do not see any signs of infection I think he is doing much better in that regard. Overall I am extremely pleased with where we stand. 01-12-2022 upon evaluation today patient appears to be doing well currently in regard to his wound although it is not getting significantly smaller it is also not getting any larger. Fortunately I do not see any evidence of infection locally nor systemically which is great news and overall I am extremely pleased with where things stand currently. 01-19-2022 upon evaluation today patient appears to be doing well currently in regard to his wound. The PolyMem actually seems to have done extremely well for him. Fortunately I do not see any signs of infection locally nor systemically at this time. 01-26-2022 upon evaluation today patient appears to be making progress. Fortunately there does not appear to be any signs of infection which is great news. No fevers, chills, nausea, vomiting, or diarrhea. With that being said this is very slow to heal and although it is smaller I still feel like we may want to try to do something to speed this up I think that a skin substitute could be beneficial, look into Kerecis. Electronic Signature(s) Signed: 01/26/2022 3:45:32 PM By: [name] PA-C Entered By: [name] on 01/26/2022 15:45:32 -------------------------------------------------------------------------------- Physical Exam Details Patient Name: Date of Service: [name], [name]. 01/26/2022 3:15 PM Medical Record  Number: [id number] Patient Account Number: [id number] Date of Birth/Sex: Treating RN: 04-03-1955 (67 y.o. M) Primary Care Provider: [name] Other Clinician: Referring Provider: Treating Provider/Extender: [name] in Treatment: 2 Constitutional Well-nourished and well-hydrated in no acute distress. Respiratory normal breathing without difficulty. Psychiatric this patient is able to make decisions and demonstrates good insight into disease process. Alert and Oriented x 3. pleasant and cooperative. Notes Upon inspection patient's wound bed actually showed signs of good granulation epithelization at this point. Fortunately there does not appear to be any signs of infection locally nor systemically which is great news and overall I am extremely pleased with where we stand today. No sharp debridement was even necessary today. Electronic Signature(s) Signed: 01/26/2022 3:45:50 PM By: [name] PA-C Entered By: [name] on 01/26/2022 15:45:50 -------------------------------------------------------------------------------- Physician Orders Details Patient Name: Date of Service: [name], [name]. 01/26/2022 3:15 PM [name] ([id number]) [id number] Page 4 of 9 Medical Record Number: [id number] Patient Account Number: [id number] Date of Birth/Sex: Treating RN: [birth year] (67 y.o. [name], [name] Primary Care Provider: [name] Other Clinician: Referring Provider: Treating Provider/Extender: [name] in Treatment: 25 Verbal / Phone Orders: No Diagnosis Coding ICD-10 Coding Code Description I89.0 Lymphedema, not elsewhere classified I87.333 Chronic venous hypertension (idiopathic) with ulcer and inflammation of bilateral lower extremity L97.828 Non-pressure chronic ulcer of other part of left lower leg with other specified severity L97.818 Non-pressure chronic ulcer of other part  of right lower leg with other specified severity E11.621 Type 2 diabetes mellitus with foot ulcer L97.518 Non-pressure chronic ulcer of other part of right foot with other specified severity Follow-up Appointments ppointment in 1 week. Margarita Grizzle on Wednesday1/24/2024 315pm (already has  appt.) Rm # 7 Return A ppointment in 2 weeks. Margarita Grizzle on Wednesday (go ahead and make appt.) Return A Anesthetic (In clinic) Topical Lidocaine 5% applied to wound bed Cellular or Tissue Based Products Cellular or Tissue Based Product Type: - Run IVR for [employer] May shower with protection but do not get wound dressing(s) wet. Protect dressing(s) with water repellant cover (for example, large plastic bag) or a cast cover and may then take shower. Edema Control - Lymphedema / SCD / Other Elevate legs to the level of the heart or above for 30 minutes daily and/or when sitting for 3-4 times a day throughout the day. Avoid standing for long periods of time. Exercise regularly Moisturize legs daily. - apply every night before bed to left leg. Compression stocking or Garment 30-40 mm/Hg pressure to: - wear your VIVE compression stocking to left leg. apply in the morning and remove at night. Will apply tubigrip size [name] to left leg. Remove once you get home and use your compression stockings. Wound Treatment Wound #7 - Malleolus Wound Laterality: Right, Medial Peri-Wound Care: Triamcinolone 15 (g) 1 x Per Week/30 Days Discharge Instructions: Use triamcinolone 15 (g) as directed Peri-Wound Care: Sween Lotion (Moisturizing lotion) 1 x Per Week/30 Days Discharge Instructions: Apply moisturizing lotion as directed Prim Dressing: PolyMem Silver Non-Adhesive Dressing, 4.25x4.25 in 1 x Per Week/30 Days ary Discharge Instructions: Apply to wound bed.****CUT SLITS IN THE POLYMEM PLEASE.**** Secondary Dressing: Zetuvit Plus 4x8 in 1 x Per Week/30 Days Discharge Instructions: Apply over primary dressing  as directed. Compression Wrap: ThreePress (3 layer compression wrap) 1 x Per Week/30 Days Discharge Instructions: Apply three layer use kerlix instead of cotton compression as directed. Electronic Signature(s) Signed: 01/26/2022 3:53:33 PM By: [name] PA-C Signed: 02/28/2022 10:40:58 AM By: Rhae Hammock RN Entered By: Rhae Hammock on 01/26/2022 15:41:01 -------------------------------------------------------------------------------- Problem List Details Patient Name: Date of Service: [name], [name]. 01/26/2022 3:15 PM [name], [name] Knudsen [name] ([id number]) [id number] Page 5 of 9 Medical Record Number: [id number] Patient Account Number: [id number] Date of Birth/Sex: Treating RN: 19-Aug-1955 (67 y.o. M) Primary Care Provider: [name] Other Clinician: Referring Provider: Treating Provider/Extender: [name] in Treatment: 25 Active Problems ICD-10 Encounter Code Description Active Date MDM Diagnosis I89.0 Lymphedema, not elsewhere classified 08/04/2021 No Yes I87.333 Chronic venous hypertension (idiopathic) with ulcer and inflammation of 08/04/2021 No Yes bilateral lower extremity L97.828 Non-pressure chronic ulcer of other part of left lower leg with other specified 08/04/2021 No Yes severity L97.818 Non-pressure chronic ulcer of other part of right lower leg with other specified 08/04/2021 No Yes severity E11.621 Type 2 diabetes mellitus with foot ulcer 08/04/2021 No Yes L97.518 Non-pressure chronic ulcer of other part of right foot with other specified 08/04/2021 No Yes severity Inactive Problems Resolved Problems Electronic Signature(s) Signed: 01/26/2022 3:20:18 PM By: [name] PA-C Entered By: [name] on 01/26/2022 15:20:17 -------------------------------------------------------------------------------- Progress Note Details Patient Name: Date of Service: [name], [name] 01/26/2022 3:15 PM Medical Record  Number: [id number] Patient Account Number: [id number] Date of Birth/Sex: Treating RN: 1955-07-27 (67 y.o. M) Primary Care Provider: [name] Other Clinician: Referring Provider: Treating Provider/Extender: [name] in Treatment: 25 Subjective Chief Complaint Information obtained from Patient 08/04/2021; patient returns to clinic with bilateral leg wounds as well as areas on the right foot History of Present Illness (HPI) ADMISSION 03/22/2021 This is a 67 year old man with a past medical history significant for  diabetes type 2, congestive heart failure, peripheral arterial disease, morbid obesity, [name], [name] ([id number]) [id number] Page 6 of 9 venous insufficiency, and coronary artery disease. He has been followed by [name] in podiatry, who performed a transmetatarsal amputation on the left foot in August 2022. He had issues healing that wound, but based upon Dr. [name] notes, ultimately the TMA wound healed. During his recovery from that surgery, however, ulcers opened up over the DIP joint of the right second and third toe. These have apparently closed and reopened multiple times. It sounds like one of the issues has been moisture accumulation and maceration of the tissues causing them to reopen. At his last visit with [name], on March 01, 2021, there continues to be problems with moisture and he was referred to wound care for further evaluation and management. He had a formal aortogram with runoff performed prior to his TMA. The findings are copied here: Patient has inline flow to both feet with no significant flow-limiting lesion that would be amenable to percutaneous or open revascularization. He does have an element of small vessel disease and has a short segment occlusion of the distal anterior tibial/dorsalis pedis artery on the left foot but does have posterior tibial artery flow. Would recommend management of  wounds with amputation of toes 2 and 3 on the right foot if the wounds do not heal and deteriorate. Transmetatarsal amputation on the left side has as good a blood supply as it is going to get and hopefully this will heal in the future. Formal ABIs were done in January 2023. They are normal bilaterally. ABI Findings: +---------+------------------+-----+----------+--------+ Right Rt Pressure (mmHg)IndexWaveform Comment  +---------+------------------+-----+----------+--------+ Brachial 160    +---------+------------------+-----+----------+--------+ PTA 192 1.06 monophasic  +---------+------------------+-----+----------+--------+ DP 159 0.88 monophasic  +---------+------------------+-----+----------+--------+ Great T oe145 0.80    +---------+------------------+-----+----------+--------+ +--------+------------------+-----+---------+-------+ Left Lt Pressure (mmHg)IndexWaveform Comment +--------+------------------+-----+---------+-------+ [id number]    +--------+------------------+-----+---------+-------+ PTA 204 1.13 triphasic  +--------+------------------+-----+---------+-------+ DP 194 1.07 biphasic   +--------+------------------+-----+---------+-------+ +-------+-----------+-----------+------------+------------+ ABI/TBIT oday's ABIT oday's TBIPrevious ABIPrevious TBI +-------+-----------+-----------+------------+------------+ Right 1.06 0.8 1.26 0.65  +-------+-----------+-----------+------------+------------+ Left 1.13 amputation 1.15 amputation  +-------+-----------+-----------+------------+------------+ Previous ABI on 08/06/20 at [facility] Pedal pressures falsely elevated due to medial calcification. Summary: Right: Resting right ankle-brachial index is within normal range. The right toe-brachial index is normal. Left: Resting left ankle-brachial index is within normal range. READMISSION 08/04/2021 Mr.  Nano is now a 67 year old man who I remember from this clinic many years ago I think he had a right lower extremity predominantly venous wound at the time. He was here for 1 visit in March of this year had wounds on his right second and third toes we apparently dressed them many and they healed so he did not come back. He is listed in [place] is being a diabetic although the patient denies this says he is verified it with his primary doctor. In any case over the last several weeks or so according the patient although these wounds look somewhat more chronic than that he has developed predominantly large wounds on the right medial and right lateral ankle smaller areas on the left leg and areas on the dorsal aspect of his right second and third toes. Its not clear how he has been dressing these. 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 The patient has known PAD. He had an angiogram in August 2022 at which time he had nonhealing wounds in both feet. On the  left his major vessels in the thigh were all patent. He had three-vessel patent to the level of the ankle. He had a very short occlusion in the left anterior tibial. On the right lower extremity the major vessels in his thigh were all patent. He had three-vessel runoff to the foot sluggish filling of the anterior tibial artery. He was felt to have some component of small vessel disease but nothing that was amenable or needed revascularization. It was recommended that he have amputation of the second and third toes on the right foot if they did not heal He has been following with [name] of podiatry. [name] got him juxta lite stockings although I do not think he had them on properly he has uncontrolled edema in both legs Past medical history includes type 2 diabetes [although the patient really denies this], left TMA in 2022,lower extremity wounds in fact attendance  at this clinic in 2009-2010, A-fib on Eliquis, chronic venous insufficiency with secondary lymphedema history of non-Hodgkin's lymphoma. 08-11-2021 upon evaluation today patient presents for follow-up evaluation he was seen last Wednesday initially for inspection here in our clinic. With that being said he tells me that he unfortunately has been having a lot of drainage and is actually coming through his wrap. Fortunately I do not see any evidence of active infection locally or systemically at this time which is great news. No fevers, chills, nausea, vomiting, or diarrhea. With that being said there does appear to be some evidence of local infection based on what I am seeing today. 08-18-2021 upon evaluation today patient appears to be doing okay currently in regard to his wounds with that being said that he is doing much better but still has a long ways to go to get to where he wanted to be. I think the infection is significantly improved. He has another week of the antibiotic at this point. 08-25-2021 upon evaluation today patient's wounds are actually doing decently well he has erythema has significantly improved. I think the cellulitis is under controlled I am going to continue him on 2 more weeks of the Levaquin at 500 mg this is a lower dose but I am hoping it will be better for him. [name], [name] ([id number]) [id number] Page 7 of 9 09-08-2021 upon evaluation today patient appears to be doing excellent in regard to his wounds. Since I last saw him he was actually in the hospital where he ended up having a pacemaker put in. Subsequently he tells me that he is actually doing quite well although they were unsure whether they were going to do it due to the fact that he had the wounds on his legs. And then I am glad they did anything seem to be doing well. 09-15-2021 upon evaluation patient's wounds are actually showing signs of improvement. The right side wounds do appear to have  some need for sharp debridement today and I Georgina Peer go ahead and proceed with that. I think that if we get the wounds cleaned up he will actually show signs of continued improvement. I am also leaning towards switching to [facility] which I think will be a much better option for him. 09-22-2021 patient's wounds are showing signs of excellent improvement. I am actually extremely pleased with where we stand and I think that the patient is making great progress. There does not appear to be any signs of active infection. 09-29-2021 upon evaluation today patient appears to be doing excellent in regard to his wounds. He  is actually tolerating the dressing changes without complication. Fortunately I see no evidence of active infection locally or systemically at this time which is great news and overall I am extremely pleased with where we stand currently. 09-2721 upon evaluation today patient actually appears to be doing excellent in regard to his wounds. The left leg is almost completely healed the right ankle is significantly smaller. Overall I am extremely pleased with where we stand at this point. I do not see any evidence of active infection at this time. 10-13-2021 upon evaluation today patient appears to be doing excellent in regard to his wounds. I really feel like he is making good progress here and I am very pleased in that regard. Fortunately I do not see any signs of active infection at this time. We are using the [facility] topical antibiotic therapy. 10-20-2021 upon evaluation today patient appears to be doing well with regard to his wound on the right medial ankle region the left leg is almost completely healed. I am actually very pleased with where we stand today. 11-03-2021 upon evaluation today patient's wound actually is going require some sharp debridement but appears to be doing much better which is great news. Fortunately I do not see any signs of active infection at this time. 11-10-2021  upon evaluation today patient's wound is actually showing signs of excellent improvement. Fortunately I do not see any evidence of infection locally or systemically which is great news and overall I am extremely pleased with where we stand today. I do believe he is making good progress he does have his [facility] topical antibiotics with him here today. 11-24-2021 upon evaluation today patient actually showing signs of excellent improvement this appears to be doing much better. Fortunately I do not see any evidence of infection locally or systemically at this time. 12-01-2021 upon evaluation today patient appears to be doing well currently in regard to his wound. He has been tolerating the dressing changes without complication. Fortunately I do not see any evidence of active infection at this time which is great news and overall I am extremely pleased with where we stand today. 12-15-2021 upon evaluation today patient appears to be doing well with regard to his wound. He is showing signs of improvement is slow but nonetheless we are making improvements here. Fortunately I do not see any evidence of active infection locally nor systemically at this time. We are still using the [facility] topical antibiotic over the open area only. 12-29-2021 upon evaluation today patient's wound actually is showing signs of excellent improvement. It has been 2 weeks since I perform any debridement and it definitely shows he has some tissue that needs to be cleaned away but I think we can do so quite easily and readily today. The good news is I do not see any signs of infection I think he is doing much better in that regard. Overall I am extremely pleased with where we stand. 01-12-2022 upon evaluation today patient appears to be doing well currently in regard to his wound although it is not getting significantly smaller it is also not getting any larger. Fortunately I do not see any evidence of infection locally nor  systemically which is great news and overall I am extremely pleased with where things stand currently. 01-19-2022 upon evaluation today patient appears to be doing well currently in regard to his wound. The PolyMem actually seems to have done extremely well for him. Fortunately I do not see any signs of infection locally nor  systemically at this time. 01-26-2022 upon evaluation today patient appears to be making progress. Fortunately there does not appear to be any signs of infection which is great news. No fevers, chills, nausea, vomiting, or diarrhea. With that being said this is very slow to heal and although it is smaller I still feel like we may want to try to do something to speed this up I think that a skin substitute could be beneficial, look into Kerecis. Objective Constitutional Well-nourished and well-hydrated in no acute distress. Vitals Time Taken: 3:11 PM, Height: 71 in, Weight: 350 lbs, BMI: 48.8, Temperature: 98.2 F, Pulse: 89 bpm, Respiratory Rate: 18 breaths/min, Blood Pressure: 133/86 mmHg. Respiratory normal breathing without difficulty. Psychiatric this patient is able to make decisions and demonstrates good insight into disease process. Alert and Oriented x 3. pleasant and cooperative. General Notes: Upon inspection patient's wound bed actually showed signs of good granulation epithelization at this point. Fortunately there does not appear to be any signs of infection locally nor systemically which is great news and overall I am extremely pleased with where we stand today. No sharp debridement was even necessary today. Integumentary (Hair, Skin) Wound #7 status is Open. Original cause of wound was Blister. The date acquired was: 07/21/2021. The wound has been in treatment 25 weeks. The wound is located on the Right,Medial Malleolus. The wound measures 4cm length x 2cm width x 0.2cm depth; 6.283cm^2 area and 1.257cm^3 volume. There is no tunneling or undermining noted. There  is a medium amount of serosanguineous drainage noted. There is large (67-100%) red granulation within the wound bed. There is a small (1-33%) amount of necrotic tissue within the wound bed including Adherent Slough. The periwound skin appearance exhibited: Dry/Scaly. The periwound skin appearance did not exhibit: Callus, Crepitus, Excoriation, Induration, Rash, Scarring, Maceration, Trai, Nash [name] ([id number]) [id number] Page 8 of 9 Hemosiderin Staining, Mottled, Pallor, Rubor, Erythema. 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 There was a Three Layer Compression Therapy Procedure by Rhae Hammock, RN. Post procedure Diagnosis Wound #7: Same as Pre-Procedure Plan Follow-up Appointments: Return Appointment in 1 week. Margarita Grizzle on Wednesday1/24/2024 315pm (already has appt.) Rm # 7 Return Appointment in 2 weeks. Margarita Grizzle on Wednesday (go ahead and make appt.) Anesthetic: (In clinic) Topical Lidocaine 5% applied to wound bed Cellular or Tissue Based Products: Cellular or Tissue Based Product Type: - Run IVR for [employer] Hygiene: May shower with protection but do not get wound dressing(s) wet. Protect dressing(s) with water repellant cover (for example, large plastic bag) or a cast cover and may then take shower. Edema Control - Lymphedema / SCD / Other: Elevate legs  to the level of the heart or above for 30 minutes daily and/or when sitting for 3-4 times a day throughout the day. Avoid standing for long periods of time. Exercise regularly Moisturize legs daily. - apply every night before bed to left leg. Compression stocking or Garment 30-40 mm/Hg pressure to: - wear your VIVE compression stocking to left  leg. apply in the morning and remove at night. Will apply tubigrip size [name] to left leg. Remove once you get home and use your compression stockings. WOUND #7: - Malleolus Wound Laterality: Right, Medial Peri-Wound Care: Triamcinolone 15 (g) 1 x Per Week/30 Days Discharge Instructions: Use triamcinolone 15 (g) as directed Peri-Wound Care: Sween Lotion (Moisturizing lotion) 1 x Per Week/30 Days Discharge Instructions: Apply moisturizing lotion as directed Prim Dressing: PolyMem Silver Non-Adhesive Dressing, 4.25x4.25 in 1 x Per Week/30 Days ary Discharge Instructions: Apply to wound bed.****CUT SLITS IN THE POLYMEM PLEASE.**** Secondary Dressing: Zetuvit Plus 4x8 in 1 x Per Week/30 Days Discharge Instructions: Apply over primary dressing as directed. Com pression Wrap: ThreePress (3 layer compression wrap) 1 x Per Week/30 Days Discharge Instructions: Apply three layer use kerlix instead of cotton compression as directed. 1. I would recommend that we have the patient continue to monitor for any signs of infection. Obviously if anything changes he knows contact the office let me know. Next #2 also can recommend the patient should continue to elevate his legs much as possible were also continue with the compression wrapping which does seem to be doing an excellent job. We will see patient back for reevaluation in 1 week here in the clinic. If anything worsens or changes patient will contact our office for additional recommendations. Electronic Signature(s) Signed: 01/26/2022 3:46:20 PM By: [name] PA-C Entered By: [name] on 01/26/2022  15:46:19 Mizell, [name] Knudsen [name] ([id number]) [id number] Page 9 of 9 -------------------------------------------------------------------------------- SuperBill Details Patient Name: Date of Service: AASIM, LOUGHRAN 01/26/2022 Medical Record Number: [id number] Patient Account Number: [id number] Date of Birth/Sex: Treating RN: 19-Nov-1955 (67 y.o. [name], [name] Primary Care Provider: [name] Other Clinician: Referring Provider: Treating Provider/Extender: [name] in Treatment: 25 Diagnosis Coding ICD-10 Codes Code Description I89.0 Lymphedema, not elsewhere classified I87.333 Chronic venous hypertension (idiopathic) with ulcer and inflammation of bilateral lower extremity L97.828 Non-pressure chronic ulcer of other part of left lower leg with other specified severity L97.818 Non-pressure chronic ulcer of other part of right lower leg with other specified severity E11.621 Type 2 diabetes mellitus with foot ulcer L97.518 Non-pressure chronic ulcer of other part of right foot with other specified severity Facility Procedures : 3 CPT4 Code: [id number] Description: (Facility Use Only) [phone] - APPLY MULTLAY COMPRS LWR RT LEG Modifier: Quantity: 1 Physician Procedures : CPT4 Code Description Modifier [id number] - WC PHYS LEVEL 3 - EST PT ICD-10 Diagnosis Description I89.0 Lymphedema, not elsewhere classified I87.333 Chronic venous hypertension (idiopathic) with ulcer and inflammation of bilateral lower extremity  L97.818 Non-pressure chronic ulcer of other part of right lower leg with other specified severity L97.828 Non-pressure chronic ulcer of other part of left lower leg with other specified severity Quantity: 1 Electronic Signature(s) Signed: 01/26/2022 3:47:14 PM By: [name] PA-C Entered By: [name] on 01/26/2022 15:47:13

## 2022-02-02 ENCOUNTER — Encounter (HOSPITAL_BASED_OUTPATIENT_CLINIC_OR_DEPARTMENT_OTHER): Payer: Medicare Other | Admitting: Physician Assistant

## 2022-02-02 DIAGNOSIS — I890 Lymphedema, not elsewhere classified: Secondary | ICD-10-CM | POA: Diagnosis not present

## 2022-02-02 DIAGNOSIS — L97318 Non-pressure chronic ulcer of right ankle with other specified severity: Secondary | ICD-10-CM | POA: Diagnosis not present

## 2022-02-02 DIAGNOSIS — Z7901 Long term (current) use of anticoagulants: Secondary | ICD-10-CM | POA: Diagnosis not present

## 2022-02-02 DIAGNOSIS — L97518 Non-pressure chronic ulcer of other part of right foot with other specified severity: Secondary | ICD-10-CM | POA: Diagnosis not present

## 2022-02-02 DIAGNOSIS — E11621 Type 2 diabetes mellitus with foot ulcer: Secondary | ICD-10-CM | POA: Diagnosis not present

## 2022-02-02 DIAGNOSIS — L97828 Non-pressure chronic ulcer of other part of left lower leg with other specified severity: Secondary | ICD-10-CM | POA: Diagnosis not present

## 2022-02-02 DIAGNOSIS — I2510 Atherosclerotic heart disease of native coronary artery without angina pectoris: Secondary | ICD-10-CM | POA: Diagnosis not present

## 2022-02-02 DIAGNOSIS — I110 Hypertensive heart disease with heart failure: Secondary | ICD-10-CM | POA: Diagnosis not present

## 2022-02-02 DIAGNOSIS — M109 Gout, unspecified: Secondary | ICD-10-CM | POA: Diagnosis not present

## 2022-02-02 DIAGNOSIS — L97818 Non-pressure chronic ulcer of other part of right lower leg with other specified severity: Secondary | ICD-10-CM | POA: Diagnosis not present

## 2022-02-02 DIAGNOSIS — E1151 Type 2 diabetes mellitus with diabetic peripheral angiopathy without gangrene: Secondary | ICD-10-CM | POA: Diagnosis not present

## 2022-02-02 DIAGNOSIS — I509 Heart failure, unspecified: Secondary | ICD-10-CM | POA: Diagnosis not present

## 2022-02-02 DIAGNOSIS — I872 Venous insufficiency (chronic) (peripheral): Secondary | ICD-10-CM | POA: Diagnosis not present

## 2022-02-02 DIAGNOSIS — I87331 Chronic venous hypertension (idiopathic) with ulcer and inflammation of right lower extremity: Secondary | ICD-10-CM | POA: Diagnosis not present

## 2022-02-02 DIAGNOSIS — I87333 Chronic venous hypertension (idiopathic) with ulcer and inflammation of bilateral lower extremity: Secondary | ICD-10-CM | POA: Diagnosis not present

## 2022-02-02 NOTE — Progress Notes (Signed)
[name] ([id number]) [id number] Page 1 of 9 Visit Report for 02/02/2022 Chief Complaint Document Details Patient Name: Date of Service: [name], [name] 02/02/2022 3:15 PM Medical Record Number: [id number] Patient Account Number: [id number] Date of Birth/Sex: Treating RN: [birth year] (67 y.o. M) Primary Care Provider: Marton [name] Other Clinician: Referring Provider: Treating Provider/Extender: [name] in Treatment: 26 Information Obtained from: Patient Chief Complaint 08/04/2021; patient returns to clinic with bilateral leg wounds as well as areas on the right foot Electronic Signature(s) Signed: 02/02/2022 3:15:23 PM By: [name] Keeler PA-C Entered By: [name] on 02/02/2022 15:15:23 -------------------------------------------------------------------------------- HPI Details Patient Name: Date of Service: [name], [name]. 02/02/2022 3:15 PM Medical Record Number: [id number] Patient Account Number: [id number] Date of Birth/Sex: Treating RN: 07-08-1955 (67 y.o. M) Primary Care Provider: Marton [name] Other Clinician: Referring Provider: Treating Provider/Extender: [name] in Treatment: 26 History of Present Illness HPI Description: ADMISSION 03/22/2021 This is a 67 year old man with a past medical history significant for diabetes type 2, congestive heart failure, peripheral arterial disease, morbid obesity, venous insufficiency, and coronary artery disease. He has been followed by [name] in podiatry, who performed a transmetatarsal amputation on the left foot in August 2022. He had issues healing that wound, but based upon [name] notes, ultimately the TMA wound healed. During his recovery from that surgery, however, ulcers opened up over the DIP joint of the right second and third toe. These have apparently closed and reopened multiple times. It sounds like one of the issues has been  moisture accumulation and maceration of the tissues causing them to reopen. At his last visit with [name], on March 01, 2021, there continues to be problems with moisture and he was referred to wound care for further evaluation and management. He had a formal aortogram with runoff performed prior to his TMA. The findings are copied here: Patient has inline flow to both feet with no significant flow-limiting lesion that would be amenable to percutaneous or open revascularization. He does have an element of small vessel disease and has a short segment occlusion of the distal anterior tibial/dorsalis pedis artery on the left foot but does have posterior tibial artery flow. Would recommend management of wounds with amputation of toes 2 and 3 on the right foot if the wounds do not heal and deteriorate. Transmetatarsal amputation on the left side has as good a blood supply as it is going to get and hopefully this will heal in the future. Formal ABIs were done in January 2023. They are normal bilaterally. ABI Findings: +---------+------------------+-----+----------+--------+ Right Rt Pressure (mmHg)IndexWaveform Comment  +---------+------------------+-----+----------+--------+ Brachial 160     +---------+------------------+-----+----------+--------+ PTA 192 1.06 monophasic  +---------+------------------+-----+----------+--------+ DP 159 0.88 monophasic  +---------+------------------+-----+----------+--------+ Great T oe145 0.80    +---------+------------------+-----+----------+--------+ +--------+------------------+-----+---------+-------+ [name] ([id number]) [id number] Page 2 of 9 Left Lt Pressure (mmHg)IndexWaveform Comment +--------+------------------+-----+---------+-------+ [id number]     +--------+------------------+-----+---------+-------+ PTA 204 1.13 triphasic   +--------+------------------+-----+---------+-------+ DP 194 1.07 biphasic   +--------+------------------+-----+---------+-------+ +-------+-----------+-----------+------------+------------+ ABI/TBIT oday's ABIT oday's TBIPrevious ABIPrevious TBI +-------+-----------+-----------+------------+------------+ Right 1.06 0.8 1.26 0.65  +-------+-----------+-----------+------------+------------+ Left 1.13 amputation 1.15 amputation  +-------+-----------+-----------+------------+------------+ Previous ABI on 08/06/20 at [facility] Pedal pressures falsely elevated due to medial calcification. Summary: Right: Resting right ankle-brachial index is within normal range. The right toe-brachial index is normal. Left: Resting left ankle-brachial index is within normal range. READMISSION 08/04/2021 [name] is now a 67 year old man who I remember from this clinic many years  ago I think he had a right lower extremity predominantly venous wound at the time. He was here for 1 visit in March of this year had wounds on his right second and third toes we apparently dressed them many and they healed so he did not come back. He is listed in [place] is being a diabetic although the patient denies this says he is verified it with his primary doctor. In any case over the last several weeks or so according the patient although these wounds look somewhat more chronic than that he has developed predominantly large wounds on the right medial and right lateral ankle smaller areas on the left leg and areas on the dorsal aspect of his right second and third toes. Its not clear how he has been dressing these. 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 The patient has known PAD. He had an angiogram in August 2022 at which time he had nonhealing wounds in both feet. On the left his major vessels in the thigh were all  patent. He had three-vessel patent to the level of the ankle. He had a very short occlusion in the left anterior tibial. On the right lower extremity the major vessels in his thigh were all patent. He had three-vessel runoff to the foot sluggish filling of the anterior tibial artery. He was felt to have some component of small vessel disease but nothing that was amenable or needed revascularization. It was recommended that he have amputation of the second and third toes on the right foot if they did not heal He has been following with [name] of podiatry. [name] got him juxta lite stockings although I do not think he had them on properly he has uncontrolled edema in both legs Past medical history includes type 2 diabetes [although the patient really denies this], left TMA in 2022,lower extremity wounds in fact attendance at this clinic in 2009-2010, A-fib on Eliquis, chronic venous insufficiency with secondary lymphedema history of non-Hodgkin's lymphoma. 08-11-2021 upon evaluation today patient presents for follow-up evaluation he was seen last Wednesday initially for inspection here in our clinic. With that being said he tells me that he unfortunately has been having a lot of drainage and is actually coming through his wrap. Fortunately I do not see any evidence of active infection locally or systemically at this time which is great news. No fevers, chills, nausea, vomiting, or diarrhea. With that being said there does appear to be some evidence of local infection based on what I am seeing today. 08-18-2021 upon evaluation today patient appears to be doing okay currently in regard to his wounds with that being said that he is doing much better but still has a long ways to go to get to where he wanted to be. I think the infection is significantly improved. He has another week of the antibiotic at this point. 08-25-2021 upon evaluation today patient's wounds are actually doing decently well he has  erythema has significantly improved. I think the cellulitis is under controlled I am going to continue him on 2 more weeks of the Levaquin at 500 mg this is a lower dose but I am hoping it will be better for him. 09-08-2021 upon evaluation today patient appears to be doing excellent in regard to his wounds. Since I last saw him he was actually in the hospital where he ended up having a pacemaker put in. Subsequently  he tells me that he is actually doing quite well although they were unsure whether they were going to do it due to the fact that he had the wounds on his legs. And then I am glad they did anything seem to be doing well. 09-15-2021 upon evaluation patient's wounds are actually showing signs of improvement. The right side wounds do appear to have some need for sharp debridement today and I Georgina Peer go ahead and proceed with that. I think that if we get the wounds cleaned up he will actually show signs of continued improvement. I am also leaning towards switching to [facility] which I think will be a much better option for him. 09-22-2021 patient's wounds are showing signs of excellent improvement. I am actually extremely pleased with where we stand and I think that the patient is making great progress. There does not appear to be any signs of active infection. 09-29-2021 upon evaluation today patient appears to be doing excellent in regard to his wounds. He is actually tolerating the dressing changes without complication. Fortunately I see no evidence of active infection locally or systemically at this time which is great news and overall I am extremely pleased with where we stand currently. 09-2721 upon evaluation today patient actually appears to be doing excellent in regard to his wounds. The left leg is almost completely healed the right ankle is significantly smaller. Overall I am extremely pleased with where we stand at this point. I do not see any evidence of active infection at this  time. 10-13-2021 upon evaluation today patient appears to be doing excellent in regard to his wounds. I really feel like he is making good progress here and I am very pleased in that regard. Fortunately I do not see any signs of active infection at this time. We are using the [facility] topical antibiotic therapy. 10-20-2021 upon evaluation today patient appears to be doing well with regard to his wound on the right medial ankle region the left leg is almost completely healed. I am actually very pleased with where we stand today. 11-03-2021 upon evaluation today patient's wound actually is going require some sharp debridement but appears to be doing much better which is great news. Fortunately I do not see any signs of active infection at this time. 11-10-2021 upon evaluation today patient's wound is actually showing signs of excellent improvement. Fortunately I do not see any evidence of infection locally [name], [name] ([id number]) [id number] Page 3 of 9 or systemically which is great news and overall I am extremely pleased with where we stand today. I do believe he is making good progress he does have his [facility] topical antibiotics with him here today. 11-24-2021 upon evaluation today patient actually showing signs of excellent improvement this appears to be doing much better. Fortunately I do not see any evidence of infection locally or systemically at this time. 12-01-2021 upon evaluation today patient appears to be doing well currently in regard to his wound. He has been tolerating the dressing changes without complication. Fortunately I do not see any evidence of active infection at this time which is great news and overall I am extremely pleased with where we stand today. 12-15-2021 upon evaluation today patient appears to be doing well with regard to his wound. He is showing signs of improvement is slow but nonetheless we are making improvements here. Fortunately I  do not see any evidence of active infection locally nor systemically at this time. We are still using the [facility]  topical antibiotic over the open area only. 12-29-2021 upon evaluation today patient's wound actually is showing signs of excellent improvement. It has been 2 weeks since I perform any debridement and it definitely shows he has some tissue that needs to be cleaned away but I think we can do so quite easily and readily today. The good news is I do not see any signs of infection I think he is doing much better in that regard. Overall I am extremely pleased with where we stand. 01-12-2022 upon evaluation today patient appears to be doing well currently in regard to his wound although it is not getting significantly smaller it is also not getting any larger. Fortunately I do not see any evidence of infection locally nor systemically which is great news and overall I am extremely pleased with where things stand currently. 01-19-2022 upon evaluation today patient appears to be doing well currently in regard to his wound. The PolyMem actually seems to have done extremely well for him. Fortunately I do not see any signs of infection locally nor systemically at this time. 01-26-2022 upon evaluation today patient appears to be making progress. Fortunately there does not appear to be any signs of infection which is great news. No fevers, chills, nausea, vomiting, or diarrhea. With that being said this is very slow to heal and although it is smaller I still feel like we may want to try to do something to speed this up I think that a skin substitute could be beneficial, look into Kerecis. 02-02-2022 upon evaluation today patient's wound is actually showing signs of excellent improvement. I do not see any evidence of infection and overall I think that we are headed in the right direction. Fortunately I think that he is tolerating the dressing changes without complication. Electronic Signature(s) Signed:  02/02/2022 4:54:39 PM By: [name] Keeler PA-C Entered By: [name] on 02/02/2022 16:54:38 -------------------------------------------------------------------------------- Physical Exam Details Patient Name: Date of Service: [name], [name]. 02/02/2022 3:15 PM Medical Record Number: [id number] Patient Account Number: [id number] Date of Birth/Sex: Treating RN: April 05, 1955 (67 y.o. M) Primary Care Provider: Marton [name] Other Clinician: Referring Provider: Treating Provider/Extender: [name] in Treatment: 28 Constitutional Well-nourished and well-hydrated in no acute distress. Respiratory normal breathing without difficulty. Psychiatric this patient is able to make decisions and demonstrates good insight into disease process. Alert and Oriented x 3. pleasant and cooperative. Notes Patient's wound bed actually is showing signs of excellent epithelization in fact there is no need for sharp debridement today the wound surfaces cleaned quite nicely as of what I am seeing currently and again there was no slough or biofilm buildup I think the PolyMem is doing a great job for him. He is very pleased to hear and see this. Electronic Signature(s) Signed: 02/02/2022 4:54:59 PM By: [name] Keeler PA-C Entered By: [name] on 02/02/2022 16:54:59 [name], [name] ([id number]) [id number] Page 4 of 9 -------------------------------------------------------------------------------- Physician Orders Details Patient Name: Date of Service: [name], [name] 02/02/2022 3:15 PM Medical Record Number: [id number] Patient Account Number: [id number] Date of Birth/Sex: Treating RN: [birth year] (66 y.o. [name], [name] Primary Care Provider: Marton [name] Other Clinician: Referring Provider: Treating Provider/Extender: [name] in Treatment: 26 Verbal / Phone Orders: No Diagnosis Coding ICD-10 Coding Code  Description I89.0 Lymphedema, not elsewhere classified I87.333 Chronic venous hypertension (idiopathic) with ulcer and inflammation of bilateral lower extremity L97.828 Non-pressure chronic ulcer of other part of left  lower leg with other specified severity L97.818 Non-pressure chronic ulcer of other part of right lower leg with other specified severity E11.621 Type 2 diabetes mellitus with foot ulcer L97.518 Non-pressure chronic ulcer of other part of right foot with other specified severity Follow-up Appointments ppointment in 1 week. Margarita Grizzle on Wednesday1/31/2024 300pm (already has appt.) Rm # 8 w/ Bobbi Return A ppointment in 2 weeks. Margarita Grizzle on Wednesday (go ahead and make appt.) Return A Anesthetic (In clinic) Topical Lidocaine 5% applied to wound bed Cellular or Tissue Based Products Cellular or Tissue Based Product Type: - Run IVR for [employer] May shower with protection but do not get wound dressing(s) wet. Protect dressing(s) with water repellant cover (for example, large plastic bag) or a cast cover and may then take shower. Edema Control - Lymphedema / SCD / Other Elevate legs to the level of the heart or above for 30 minutes daily and/or when sitting for 3-4 times a day throughout the day. Avoid standing for long periods of time. Exercise regularly Moisturize legs daily. - apply every night before bed to left leg. Compression stocking or Garment 30-40 mm/Hg pressure to: - wear your VIVE compression stocking to left leg. apply in the morning and remove at night. Will apply tubigrip size [name] to left leg. Remove once you get home and use your compression stockings. Wound Treatment Wound #7 - Malleolus Wound Laterality: Right, Medial Peri-Wound Care: Triamcinolone 15 (g) 1 x Per Week/30 Days Discharge Instructions: Use triamcinolone 15 (g) as directed Peri-Wound Care: Sween Lotion (Moisturizing lotion) 1 x Per Week/30 Days Discharge Instructions: Apply  moisturizing lotion as directed Prim Dressing: PolyMem Silver Non-Adhesive Dressing, 4.25x4.25 in 1 x Per Week/30 Days ary Discharge Instructions: Apply to wound bed.****CUT SLITS IN THE POLYMEM PLEASE.**** Secondary Dressing: Zetuvit Plus 4x8 in 1 x Per Week/30 Days Discharge Instructions: Apply over primary dressing as directed. Compression Wrap: ThreePress (3 layer compression wrap) 1 x Per Week/30 Days Discharge Instructions: Apply three layer use kerlix instead of cotton compression as directed. Electronic Signature(s) Signed: 02/02/2022 4:09:44 PM By: Rhae Hammock RN Signed: 02/02/2022 5:48:31 PM By: [name] Keeler PA-C Entered By: Rhae Hammock on 02/02/2022 15:51:26 [name], [name] ([id number]) [id number] Page 5 of 9 -------------------------------------------------------------------------------- Problem List Details Patient Name: Date of Service: [name], [name] 02/02/2022 3:15 PM Medical Record Number: [id number] Patient Account Number: [id number] Date of Birth/Sex: Treating RN: 25-Dec-1955 (67 y.o. M) Primary Care Provider: Marton [name] Other Clinician: Referring Provider: Treating Provider/Extender: [name] in Treatment: 26 Active Problems ICD-10 Encounter Code Description Active Date MDM Diagnosis I89.0 Lymphedema, not elsewhere classified 08/04/2021 No Yes I87.333 Chronic venous hypertension (idiopathic) with ulcer and inflammation of 08/04/2021 No Yes bilateral lower extremity L97.828 Non-pressure chronic ulcer of other part of left lower leg with other specified 08/04/2021 No Yes severity L97.818 Non-pressure chronic ulcer of other part of right lower leg with other specified 08/04/2021 No Yes severity E11.621 Type 2 diabetes mellitus with foot ulcer 08/04/2021 No Yes L97.518 Non-pressure chronic ulcer of other part of right foot with other specified 08/04/2021 No Yes severity Inactive Problems Resolved  Problems Electronic Signature(s) Signed: 02/02/2022 3:15:13 PM By: [name] Keeler PA-C Entered By: [name] on 02/02/2022 15:15:13 -------------------------------------------------------------------------------- Progress Note Details Patient Name: Date of Service: [name], [name] 02/02/2022 3:15 PM Medical Record Number: [id number] Patient Account Number: [id number] Date of Birth/Sex: Treating RN: 11-04-1955 (67 y.o. M) Primary Care Provider: Marton [name]  Other Clinician: Referring Provider: Treating Provider/Extender: [name] in Treatment: 26 Subjective Chief Complaint Information obtained from Patient 08/04/2021; patient returns to clinic with bilateral leg wounds as well as areas on the right foot NILAY, MANGRUM [name] ([id number]) [phone].pdf Page 6 of 9 History of Present Illness (HPI) ADMISSION 03/22/2021 This is a 67 year old man with a past medical history significant for diabetes type 2, congestive heart failure, peripheral arterial disease, morbid obesity, venous insufficiency, and coronary artery disease. He has been followed by [name] in podiatry, who performed a transmetatarsal amputation on the left foot in August 2022. He had issues healing that wound, but based upon [name] notes, ultimately the TMA wound healed. During his recovery from that surgery, however, ulcers opened up over the DIP joint of the right second and third toe. These have apparently closed and reopened multiple times. It sounds like one of the issues has been moisture accumulation and maceration of the tissues causing them to reopen. At his last visit with [name], on March 01, 2021, there continues to be problems with moisture and he was referred to wound care for further evaluation and management. He had a formal aortogram with runoff performed prior to his TMA. The findings are copied here: Patient has inline flow to both feet with no  significant flow-limiting lesion that would be amenable to percutaneous or open revascularization. He does have an element of small vessel disease and has a short segment occlusion of the distal anterior tibial/dorsalis pedis artery on the left foot but does have posterior tibial artery flow. Would recommend management of wounds with amputation of toes 2 and 3 on the right foot if the wounds do not heal and deteriorate. Transmetatarsal amputation on the left side has as good a blood supply as it is going to get and hopefully this will heal in the future. Formal ABIs were done in January 2023. They are normal bilaterally. ABI Findings: +---------+------------------+-----+----------+--------+ Right Rt Pressure (mmHg)IndexWaveform Comment  +---------+------------------+-----+----------+--------+ Brachial 160    +---------+------------------+-----+----------+--------+ PTA 192 1.06 monophasic  +---------+------------------+-----+----------+--------+ DP 159 0.88 monophasic  +---------+------------------+-----+----------+--------+ Great T oe145 0.80    +---------+------------------+-----+----------+--------+ +--------+------------------+-----+---------+-------+ Left Lt Pressure (mmHg)IndexWaveform Comment +--------+------------------+-----+---------+-------+ [id number]    +--------+------------------+-----+---------+-------+ PTA 204 1.13 triphasic  +--------+------------------+-----+---------+-------+ DP 194 1.07 biphasic   +--------+------------------+-----+---------+-------+ +-------+-----------+-----------+------------+------------+ ABI/TBIT oday's ABIT oday's TBIPrevious ABIPrevious TBI +-------+-----------+-----------+------------+------------+ Right 1.06 0.8 1.26 0.65  +-------+-----------+-----------+------------+------------+ Left 1.13 amputation 1.15 amputation   +-------+-----------+-----------+------------+------------+ Previous ABI on 08/06/20 at [facility] Pedal pressures falsely elevated due to medial calcification. Summary: Right: Resting right ankle-brachial index is within normal range. The right toe-brachial index is normal. Left: Resting left ankle-brachial index is within normal range. READMISSION 08/04/2021 Mr. Goodner is now a 67 year old man who I remember from this clinic many years ago I think he had a right lower extremity predominantly venous wound at the time. He was here for 1 visit in March of this year had wounds on his right second and third toes we apparently dressed them many and they healed so he did not come back. He is listed in [place] is being a diabetic although the patient denies this says he is verified it with his primary doctor. In any case over the last several weeks or so according the patient although these wounds look somewhat more chronic than that he has developed predominantly large wounds on the right medial and right lateral ankle smaller areas on the left leg and areas on the dorsal aspect of his right  second and third toes. Its not clear how he has been dressing these. 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 The patient has known PAD. He had an angiogram in August 2022 at which time he had nonhealing wounds in both feet. On the left his major vessels in the thigh were all patent. He had three-vessel patent to the level of the ankle. He had a very short occlusion in the left anterior tibial. On the right lower extremity the major vessels in his thigh were all patent. He had three-vessel runoff to the foot sluggish filling of the anterior tibial artery. He was felt to have some component of small vessel disease but nothing that was amenable or needed revascularization. It was recommended that he have amputation of the second and  third toes on the right foot if they did not heal He has been following with [name] of podiatry. [name] got him juxta lite stockings although I do not think he had them on properly he has uncontrolled edema in both legs Past medical history includes type 2 diabetes [although the patient really denies this], left TMA in 2022,lower extremity wounds in fact attendance at this clinic in 2009-2010, A-fib on Eliquis, chronic venous insufficiency with secondary lymphedema history of non-Hodgkin's lymphoma. 08-11-2021 upon evaluation today patient presents for follow-up evaluation he was seen last Wednesday initially for inspection here in our clinic. With that being said he tells me that he unfortunately has been having a lot of drainage and is actually coming through his wrap. Fortunately I do not see any evidence of active infection locally or systemically at this time which is great news. No fevers, chills, nausea, vomiting, or diarrhea. With that being said there does appear to be some evidence of local infection based on what I am seeing today. 08-18-2021 upon evaluation today patient appears to be doing okay currently in regard to his wounds with that being said that he is doing much better but still has a long ways to go to get to where he wanted to be. I think the infection is significantly improved. He has another week of the antibiotic at this point. [name], [name] ([id number]) [id number] Page 7 of 9 08-25-2021 upon evaluation today patient's wounds are actually doing decently well he has erythema has significantly improved. I think the cellulitis is under controlled I am going to continue him on 2 more weeks of the Levaquin at 500 mg this is a lower dose but I am hoping it will be better for him. 09-08-2021 upon evaluation today patient appears to be doing excellent in regard to his wounds. Since I last saw him he was actually in the hospital where he ended up having a  pacemaker put in. Subsequently he tells me that he is actually doing quite well although they were unsure whether they were going to do it due to the fact that he had the wounds on his legs. And then I am glad they did anything seem to be doing well. 09-15-2021 upon evaluation patient's wounds are actually showing signs of improvement. The right side wounds do appear to have some need for sharp debridement today and I Georgina Peer go ahead and proceed with that. I think that if we get the wounds cleaned up he will actually show signs of continued improvement. I am also leaning towards switching to [facility] which I think will be a much better  option for him. 09-22-2021 patient's wounds are showing signs of excellent improvement. I am actually extremely pleased with where we stand and I think that the patient is making great progress. There does not appear to be any signs of active infection. 09-29-2021 upon evaluation today patient appears to be doing excellent in regard to his wounds. He is actually tolerating the dressing changes without complication. Fortunately I see no evidence of active infection locally or systemically at this time which is great news and overall I am extremely pleased with where we stand currently. 09-2721 upon evaluation today patient actually appears to be doing excellent in regard to his wounds. The left leg is almost completely healed the right ankle is significantly smaller. Overall I am extremely pleased with where we stand at this point. I do not see any evidence of active infection at this time. 10-13-2021 upon evaluation today patient appears to be doing excellent in regard to his wounds. I really feel like he is making good progress here and I am very pleased in that regard. Fortunately I do not see any signs of active infection at this time. We are using the [facility] topical antibiotic therapy. 10-20-2021 upon evaluation today patient appears to be doing well with regard to  his wound on the right medial ankle region the left leg is almost completely healed. I am actually very pleased with where we stand today. 11-03-2021 upon evaluation today patient's wound actually is going require some sharp debridement but appears to be doing much better which is great news. Fortunately I do not see any signs of active infection at this time. 11-10-2021 upon evaluation today patient's wound is actually showing signs of excellent improvement. Fortunately I do not see any evidence of infection locally or systemically which is great news and overall I am extremely pleased with where we stand today. I do believe he is making good progress he does have his [facility] topical antibiotics with him here today. 11-24-2021 upon evaluation today patient actually showing signs of excellent improvement this appears to be doing much better. Fortunately I do not see any evidence of infection locally or systemically at this time. 12-01-2021 upon evaluation today patient appears to be doing well currently in regard to his wound. He has been tolerating the dressing changes without complication. Fortunately I do not see any evidence of active infection at this time which is great news and overall I am extremely pleased with where we stand today. 12-15-2021 upon evaluation today patient appears to be doing well with regard to his wound. He is showing signs of improvement is slow but nonetheless we are making improvements here. Fortunately I do not see any evidence of active infection locally nor systemically at this time. We are still using the [facility] topical antibiotic over the open area only. 12-29-2021 upon evaluation today patient's wound actually is showing signs of excellent improvement. It has been 2 weeks since I perform any debridement and it definitely shows he has some tissue that needs to be cleaned away but I think we can do so quite easily and readily today. The good news is I do not see  any signs of infection I think he is doing much better in that regard. Overall I am extremely pleased with where we stand. 01-12-2022 upon evaluation today patient appears to be doing well currently in regard to his wound although it is not getting significantly smaller it is also not getting any larger. Fortunately I do not see any evidence  of infection locally nor systemically which is great news and overall I am extremely pleased with where things stand currently. 01-19-2022 upon evaluation today patient appears to be doing well currently in regard to his wound. The PolyMem actually seems to have done extremely well for him. Fortunately I do not see any signs of infection locally nor systemically at this time. 01-26-2022 upon evaluation today patient appears to be making progress. Fortunately there does not appear to be any signs of infection which is great news. No fevers, chills, nausea, vomiting, or diarrhea. With that being said this is very slow to heal and although it is smaller I still feel like we may want to try to do something to speed this up I think that a skin substitute could be beneficial, look into Kerecis. 02-02-2022 upon evaluation today patient's wound is actually showing signs of excellent improvement. I do not see any evidence of infection and overall I think that we are headed in the right direction. Fortunately I think that he is tolerating the dressing changes without complication. Objective Constitutional Well-nourished and well-hydrated in no acute distress. Vitals Time Taken: 3:20 PM, Height: 71 in, Weight: 350 lbs, BMI: 48.8, Temperature: 98.7 F, Pulse: 86 bpm, Respiratory Rate: 18 breaths/min, Blood Pressure: 156/96 mmHg. Respiratory normal breathing without difficulty. Psychiatric this patient is able to make decisions and demonstrates good insight into disease process. Alert and Oriented x 3. pleasant and cooperative. General Notes: Patient's wound bed actually is  showing signs of excellent epithelization in fact there is no need for sharp debridement today the wound surfaces cleaned quite nicely as of what I am seeing currently and again there was no slough or biofilm buildup I think the PolyMem is doing a great job for him. He is very pleased to hear and see this. [name], [name] ([id number]) [id number] Page 8 of 9 Integumentary (Hair, Skin) Wound #7 status is Open. Original cause of wound was Blister. The date acquired was: 07/21/2021. The wound has been in treatment 26 weeks. The wound is located on the Right,Medial Malleolus. The wound measures 3.8cm length x 1.5cm width x 0.2cm depth; 4.477cm^2 area and 0.895cm^3 volume. There is no tunneling or undermining noted. There is a medium amount of serosanguineous drainage noted. There is large (67-100%) red granulation within the wound bed. There is a small (1-33%) amount of necrotic tissue within the wound bed including Adherent Slough. The periwound skin appearance exhibited: Dry/Scaly. The periwound skin appearance did not exhibit: Callus, Crepitus, Excoriation, Induration, Rash, Scarring, Maceration, Atrophie Blanche, Cyanosis, Ecchymosis, Hemosiderin Staining, Mottled, Pallor, Rubor, Erythema. 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 There was a Three Layer Compression Therapy Procedure by Rhae Hammock, RN. Post procedure  Diagnosis Wound #7: Same as Pre-Procedure Plan Follow-up Appointments: Return Appointment in 1 week. Margarita Grizzle on Wednesday1/31/2024 300pm (already has appt.) Rm # 8 w/ Bobbi Return Appointment in 2 weeks. Margarita Grizzle on Wednesday (go ahead and make appt.) Anesthetic: (In clinic) Topical Lidocaine 5% applied to wound bed Cellular or Tissue Based  Products: Cellular or Tissue Based Product Type: - Run IVR for [employer] Hygiene: May shower with protection but do not get wound dressing(s) wet. Protect dressing(s) with water repellant cover (for example, large plastic bag) or a cast cover and may then take shower. Edema Control - Lymphedema / SCD / Other: Elevate legs to the level of the heart or above for 30 minutes daily and/or when sitting for 3-4 times a day throughout the day. Avoid standing for long periods of time. Exercise regularly Moisturize legs daily. - apply every night before bed to left leg. Compression stocking or Garment 30-40 mm/Hg pressure to: - wear your VIVE compression stocking to left leg. apply in the morning and remove at night. Will apply tubigrip size [name] to left leg. Remove once you get home and use your compression stockings. WOUND #7: - Malleolus Wound Laterality: Right, Medial Peri-Wound Care: Triamcinolone 15 (g) 1 x Per Week/30 Days Discharge Instructions: Use triamcinolone 15 (g) as directed Peri-Wound Care: Sween Lotion (Moisturizing lotion) 1 x Per Week/30 Days Discharge Instructions: Apply moisturizing lotion as directed Prim Dressing: PolyMem Silver Non-Adhesive Dressing, 4.25x4.25 in 1 x Per Week/30 Days ary Discharge Instructions: Apply to wound bed.****CUT SLITS IN THE POLYMEM PLEASE.**** Secondary Dressing: Zetuvit Plus 4x8 in 1 x Per Week/30 Days Discharge Instructions: Apply over primary dressing as directed. Com pression Wrap: ThreePress (3 layer compression wrap) 1 x Per Week/30 Days Discharge Instructions: Apply three layer use kerlix  instead of cotton compression as directed. 1. I am good recommend currently that we have the patient continue to monitor for any signs of infection or worsening. Based on what I am seeing I do believe that he is making excellent progress and I am hopeful he will continue to show signs of improvement. We are still holding on the approval for Kerecis again we have not heard back from them as of yet. 2. I am good recommend as well that we have the patient continue with the compression wrapping and again we have been doing a 3 layer compression wrap which seems to be doing well. 3. I am also can recommend he continue to elevate his legs much as possible to help with edema control. We will see patient back for reevaluation in 1 week here in the clinic. If anything worsens or changes patient will contact our office for additional recommendations. Electronic Signature(s) [name], [name] ([id number]) [id number] Page 9 of 9 Signed: 02/02/2022 4:55:48 PM By: [name] Keeler PA-C Entered By: [name] on 02/02/2022 16:55:48 -------------------------------------------------------------------------------- SuperBill Details Patient Name: Date of Service: Palacios, [name]. 02/02/2022 Medical Record Number: [id number] Patient Account Number: [id number] Date of Birth/Sex: Treating RN: 1955/07/20 (67 y.o. [name], [name] Primary Care Provider: Marton [name] Other Clinician: Referring Provider: Treating Provider/Extender: [name] in Treatment: 26 Diagnosis Coding ICD-10 Codes Code Description I89.0 Lymphedema, not elsewhere classified I87.333 Chronic venous hypertension (idiopathic) with ulcer and inflammation of bilateral lower extremity L97.828 Non-pressure chronic ulcer of other part of left lower leg with other specified severity L97.818 Non-pressure chronic ulcer of other part of right lower leg with other specified severity E11.621 Type 2  diabetes mellitus with foot ulcer L97.518 Non-pressure chronic ulcer of other part of right foot with other specified severity Facility Procedures : 3 CPT4 Code: 2010071 Description: (Facility Use Only) [id number] - APPLY MULTLAY COMPRS LWR RT LEG Modifier: Quantity: 1 Physician Procedures : CPT4 Code Description Modifier [id number] - WC PHYS LEVEL 3 -  EST PT ICD-10 Diagnosis Description I89.0 Lymphedema, not elsewhere classified I87.333 Chronic venous hypertension (idiopathic) with ulcer and inflammation of bilateral lower extremity  L97.828 Non-pressure chronic ulcer of other part of left lower leg with other specified severity L97.818 Non-pressure chronic ulcer of other part of right lower leg with other specified severity Quantity: 1 Electronic Signature(s) Signed: 02/02/2022 4:56:06 PM By: [name] Keeler PA-C Previous Signature: 02/02/2022 4:09:44 PM Version By: Rhae Hammock RN Entered By: [name] on 02/02/2022 16:56:06

## 2022-02-02 NOTE — Progress Notes (Signed)
[name], [name] ([id number]) [id number] Page 1 of 6 Visit Report for 02/02/2022 Arrival Information Details Patient Name: Date of Service: [name] 02/02/2022 3:15 PM Medical Record Number: [id number] Patient Account Number: [id number] Date of Birth/Sex: Treating RN: [birth year] (67 y.o. Burnadette [name], [name] Primary Care [name]: [name] Other Clinician: Referring [name]: Treating [name]/Extender: [name] in Treatment: 26 Visit Information History Since Last Visit Added or deleted any medications: No Patient Arrived: [name] Any new allergies or adverse reactions: No Arrival Time: 15:20 Had a fall or experienced change in No Accompanied By: self activities of daily living that may affect Transfer Assistance: None risk of falls: Patient Identification Verified: Yes Signs or symptoms of abuse/neglect since last visito No Secondary Verification Process Completed: Yes Hospitalized since last visit: No Patient Requires Transmission-Based Precautions: No Implantable device outside of the clinic excluding No Patient Has Alerts: Yes cellular tissue based products placed in the center Patient Alerts: Patient on Blood Thinner since last visit: 01/2021 ABI L 1.13 R 1.06 Has Compression in Place as Prescribed: Yes 01/2021 TBI L amp R 0.8 Pain Present Now: No Electronic Signature(s) Signed: 02/02/2022 4:38:50 PM By: [name] Entered By: [name] on 02/02/2022 15:49:41 -------------------------------------------------------------------------------- Compression Therapy Details Patient Name: Date of Service: [name] Gloss [name]. 02/02/2022 3:15 PM Medical Record Number: [id number] Patient Account Number: [id number] Date of Birth/Sex: Treating RN: 1955-11-13 (67 y.o. [name] Noe Primary Care Felicita Nuncio: [name] Other Clinician: Referring Gracen Ringwald: Treating Darryn Kydd/Extender: [name] in Treatment: 26 Compression Therapy Performed for Wound Assessment: Wound #7 Right,Medial Malleolus Performed By: Clinician [name] Hammock, RN Compression Type: Three Layer Post Procedure Diagnosis Same as Pre-procedure Electronic Signature(s) Signed: 02/02/2022 4:09:44 PM By: [name] Hammock RN Entered By: [name] on 02/02/2022 15:52:10 [name] ([id number]) [id number] Page 2 of 6 -------------------------------------------------------------------------------- Encounter Discharge Information Details Patient Name: Date of Service: [name] 02/02/2022 3:15 PM Medical Record Number: [id number] Patient Account Number: [id number] Date of Birth/Sex: Treating RN: [birth year] (67 y.o. Burnadette [name], [name] Primary Care Lisandro Meggett: [name] Other Clinician: Referring Keniyah Gelinas: Treating Verle Wheeling/Extender: [name] in Treatment: 26 Encounter Discharge Information Items Discharge Condition: Stable Ambulatory Status: Walker Discharge Destination: Home Transportation: Private Auto Accompanied By: [name] Schedule Follow-up Appointment: Yes Clinical Summary of Care: Patient Declined Electronic Signature(s) Signed: 02/02/2022 4:09:44 PM By: [name] Hammock RN Entered By: [name] on 02/02/2022 15:53:09 -------------------------------------------------------------------------------- Lower Extremity Assessment Details Patient Name: Date of Service: [name], DA Marzetta Merino [name]. 02/02/2022 3:15 PM Medical Record Number: [id number] Patient Account Number: [id number] Date of Birth/Sex: Treating RN: [birth year] (67 y.o. Burnadette [name], [name] Primary Care Tahsin Benyo: [name] Other Clinician: Referring Alora [name]: Treating Angelika Jerrett/Extender: [name] in Treatment: 26 Edema Assessment Assessed: [name]: No] [name]: No] Edema: [Left: N] [Right: o] Calf Left: Right: Point of  Measurement: 31 cm From Medial Instep 38.2 cm Ankle Left: Right: Point of Measurement: 11 cm From Medial Instep 26 cm Electronic Signature(s) Signed: 02/02/2022 4:09:44 PM By: [name] Hammock RN Signed: 02/02/2022 4:38:50 PM By: [name] Entered By: [name] on 02/02/2022 15:49:53 -------------------------------------------------------------------------------- Multi-Disciplinary Care Plan Details Patient Name: Date of Service: [name] Gloss [name]. 02/02/2022 3:15 PM Medical Record Number: [id number] Patient Account Number: [id number] Date of Birth/Sex: Treating RN: 19-Jan-1955 (67 y.o. Burnadette [name], [name] Primary Care Donal Lynam: [name] Other Clinician: Referring Anhthu Perdew: Treating Humza Tallerico/Extender: Roxy Manns [place], [place] [name] ([id number]) [phone].pdf Page 3  of 6 Weeks in Treatment: [name] reviewed with physician Active Inactive Pain, Acute or Chronic Nursing Diagnoses: Pain, acute or chronic: actual or potential Potential alteration in comfort, pain Goals: Patient will verbalize adequate pain control and receive pain control interventions during procedures as needed Date Initiated: 08/04/2021 Target Resolution Date: 02/12/2022 Goal Status: Active Patient/caregiver will verbalize comfort level met Date Initiated: 08/04/2021 Target Resolution Date: 02/11/2022 Goal Status: Active Interventions: Encourage patient to take pain medications as prescribed Provide education on pain management Reposition patient for comfort Treatment Activities: Administer pain control measures as ordered : 08/04/2021 Notes: Electronic Signature(s) Signed: 02/02/2022 4:09:44 PM By: [name] Hammock RN Entered By: [name] on 02/02/2022 15:51:35 -------------------------------------------------------------------------------- Pain Assessment Details Patient Name: Date of Service: [name]. 02/02/2022 3:15  PM Medical Record Number: [id number] Patient Account Number: [id number] Date of Birth/Sex: Treating RN: 06/10/1955 (67 y.o. Burnadette [name], [name] Primary Care Tyrice Hewitt: [name] Other Clinician: Referring Shawnmichael Parenteau: Treating Adaline Trejos/Extender: [name] in Treatment: 26 Active Problems Location of Pain Severity and Description of Pain Patient Has Paino No Site Locations FINNIS, COLEE [name] ([id number]) [id number] Page 4 of 6 Pain Management and Medication Current Pain Management: Electronic Signature(s) Signed: 02/02/2022 4:09:44 PM By: [name] Hammock RN Signed: 02/02/2022 4:38:50 PM By: [name] Entered By: [name] on 02/02/2022 15:49:49 -------------------------------------------------------------------------------- Patient/Caregiver Education Details Patient Name: Date of Service: [name] 1/24/2024andnbsp3:15 PM Medical Record Number: [id number] Patient Account Number: [id number] Date of Birth/Gender: Treating RN: [birth year] (67 y.o. [name] Noe Primary Care Physician: [name] Other Clinician: Referring Physician: Treating Physician/Extender: [name] in Treatment: 26 Education Assessment Education Provided To: Patient Education Topics Provided Wound/Skin Impairment: Methods: Explain/Verbal Responses: Reinforcements needed, State content correctly [employer]) Signed: 02/02/2022 4:09:44 PM By: [name] Hammock RN Entered By: [name] on 02/02/2022 15:51:48 -------------------------------------------------------------------------------- Wound Assessment Details Patient Name: Date of Service: [place], DA Marzetta Merino [name]. 02/02/2022 3:15 PM Medical Record Number: [id number] Patient Account Number: [id number] Date of Birth/Sex: Treating RN: [birth year] (66 y.o. Burnadette [name], [name] Primary Care Armarion Greek: [name] Other Clinician: Referring  Emeline Simpson: Treating Kenzy Campoverde/Extender: [name] in Treatment: 26 Wound Status Wound Number: 7 Primary Venous Leg Ulcer Etiology: Wound Location: Right, Medial Malleolus Secondary Lymphedema Wounding Event: Blister Etiology: Date Acquired: 07/21/2021 Wound Open Weeks Of Treatment: 26 Status: Clustered Wound: No Comorbid Sleep Apnea, Arrhythmia, Congestive Heart Failure, History: Hypertension, Hypotension, Peripheral Arterial Disease, Peripheral Venous Disease, Type II Diabetes, Gout, Osteoarthritis Photos JACKOB, CROOKSTON [name] ([id number]) [id number] Page 5 of 6 Wound Measurements Length: (cm) Width: (cm) Depth: (cm) Area: (cm) Volume: (cm) 3.8 % Reduction in Area: 88.3% 1.5 % Reduction in Volume: 96.7% 0.2 Epithelialization: Small (1-33%) 4.477 Tunneling: No 0.895 Undermining: No Wound Description Classification: Full Thickness With Exposed Suppo Exudate Amount: Medium Exudate Type: Serosanguineous Exudate Color: red, brown rt Structures Foul Odor After Cleansing: No Slough/Fibrino No Wound Bed Granulation Amount: Large (67-100%) Granulation Quality: Red Necrotic Amount: Small (1-33%) Necrotic Quality: Adherent Slough Periwound Skin Texture Texture Color No Abnormalities Noted: No No Abnormalities Noted: No Callus: No Atrophie Blanche: No Crepitus: No Cyanosis: No Excoriation: No Ecchymosis: No Induration: No Erythema: No Rash: No Hemosiderin Staining: No Scarring: No Mottled: No Pallor: No Moisture Rubor: No No Abnormalities Noted: No Dry / Scaly: Yes Maceration: No Treatment Notes Wound #7 (Malleolus) Wound Laterality: Right, Medial Cleanser Peri-Wound Care Triamcinolone 15 (g) Discharge Instruction: Use triamcinolone 15 (g) as directed [name] (  Moisturizing lotion) Discharge Instruction: Apply moisturizing lotion as directed Topical Primary Dressing PolyMem Silver Non-Adhesive Dressing,  4.25x4.25 in Discharge Instruction: Apply to wound bed.****CUT SLITS IN THE POLYMEM PLEASE.**** Secondary Dressing Zetuvit Plus 4x8 in Discharge Instruction: Apply over primary dressing as directed. Secured With Compression Wrap ThreePress (3 layer compression wrap) Discharge Instruction: Apply three layer use kerlix instead of cotton compression as directed. GRAHM, ETSITTY ([id number]) [id number] Page 6 of 6 Compression Stockings Add-Ons Electronic Signature(s) Signed: 02/02/2022 4:09:44 PM By: [name] Hammock RN Signed: 02/02/2022 4:38:50 PM By: [name] Entered By: [name] on 02/02/2022 15:46:34 -------------------------------------------------------------------------------- Vitals Details Patient Name: Date of Service: [place], DA [name] 02/02/2022 3:15 PM Medical Record Number: [id number] Patient Account Number: [id number] Date of Birth/Sex: Treating RN: August 12, 1955 (67 y.o. Burnadette [name], [name] Primary Care Kerby Borner: [name] Other Clinician: Referring Clea Dubach: Treating Mandolin Falwell/Extender: [name] in Treatment: 26 Vital Signs Time Taken: 15:20 Temperature (F): 98.7 Height (in): 71 Pulse (bpm): 86 Weight (lbs): 350 Respiratory Rate (breaths/min): 18 Body Mass Index (BMI): 48.8 Blood Pressure (mmHg): 156/96 Reference Range: 80 - 120 mg / dl Electronic Signature(s) Signed: 02/02/2022 4:38:50 PM By: [name] Entered By: [name] on 02/02/2022 15:49:44

## 2022-02-06 DIAGNOSIS — R5381 Other malaise: Secondary | ICD-10-CM | POA: Diagnosis not present

## 2022-02-06 DIAGNOSIS — T148XXA Other injury of unspecified body region, initial encounter: Secondary | ICD-10-CM | POA: Diagnosis not present

## 2022-02-06 DIAGNOSIS — J449 Chronic obstructive pulmonary disease, unspecified: Secondary | ICD-10-CM | POA: Diagnosis not present

## 2022-02-06 DIAGNOSIS — G4733 Obstructive sleep apnea (adult) (pediatric): Secondary | ICD-10-CM | POA: Diagnosis not present

## 2022-02-06 DIAGNOSIS — L089 Local infection of the skin and subcutaneous tissue, unspecified: Secondary | ICD-10-CM | POA: Diagnosis not present

## 2022-02-09 ENCOUNTER — Encounter (HOSPITAL_BASED_OUTPATIENT_CLINIC_OR_DEPARTMENT_OTHER): Payer: Medicare Other | Admitting: Physician Assistant

## 2022-02-09 DIAGNOSIS — S91001A Unspecified open wound, right ankle, initial encounter: Secondary | ICD-10-CM | POA: Diagnosis not present

## 2022-02-09 NOTE — Progress Notes (Addendum)
[name] ([id number]) [id number] Page 1 of 9 Visit Report for 02/09/2022 Chief Complaint Document Details Patient Name: Date of Service: [name], [name] 02/09/2022 3:00 PM Medical Record Number: [id number] Patient Account Number: [id number] Date of Birth/Sex: Treating RN: October 01, 1955 (67 y.o. M) Primary Care Provider: [name] Other Clinician: Referring Provider: Treating Provider/Extender: [name] in Treatment: 27 Information Obtained from: Patient Chief Complaint 08/04/2021; patient returns to clinic with bilateral leg wounds as well as areas on the right foot Electronic Signature(s) Signed: 02/09/2022 3:11:43 PM By: [name] PA-C Entered By: [name] on 02/09/2022 15:11:43 -------------------------------------------------------------------------------- HPI Details Patient Name: Date of Service: [name], [name]. 02/09/2022 3:00 PM Medical Record Number: [id number] Patient Account Number: [id number] Date of Birth/Sex: Treating RN: 1955/04/08 (67 y.o. M) Primary Care Provider: [name] Other Clinician: Referring Provider: Treating Provider/Extender: [name] in Treatment: 27 History of Present Illness HPI Description: ADMISSION 03/22/2021 This is a 67 year old man with a past medical history significant for diabetes type 2, congestive heart failure, peripheral arterial disease, morbid obesity, venous insufficiency, and coronary artery disease. He has been followed by [name] in podiatry, who performed a transmetatarsal amputation on the left foot in August 2022. He had issues healing that wound, but based upon Dr. [name] notes, ultimately the TMA wound healed. During his recovery from that surgery, however, ulcers opened up over the DIP joint of the right second and third toe. These have apparently closed and reopened multiple times. It sounds like one of the issues has been  moisture accumulation and maceration of the tissues causing them to reopen. At his last visit with [name], on March 01, 2021, there continues to be problems with moisture and he was referred to wound care for further evaluation and management. He had a formal aortogram with runoff performed prior to his TMA. The findings are copied here: Patient has inline flow to both feet with no significant flow-limiting lesion that would be amenable to percutaneous or open revascularization. He does have an element of small vessel disease and has a short segment occlusion of the distal anterior tibial/dorsalis pedis artery on the left foot but does have posterior tibial artery flow. Would recommend management of wounds with amputation of toes 2 and 3 on the right foot if the wounds do not heal and deteriorate. Transmetatarsal amputation on the left side has as good a blood supply as it is going to get and hopefully this will heal in the future. Formal ABIs were done in January 2023. They are normal bilaterally. ABI Findings: +---------+------------------+-----+----------+--------+ Right Rt Pressure (mmHg)IndexWaveform Comment  +---------+------------------+-----+----------+--------+ Brachial 160     +---------+------------------+-----+----------+--------+ PTA 192 1.06 monophasic  +---------+------------------+-----+----------+--------+ DP 159 0.88 monophasic  +---------+------------------+-----+----------+--------+ Great T oe145 0.80    +---------+------------------+-----+----------+--------+ +--------+------------------+-----+---------+-------+ [name] ([id number]) [id number] Page 2 of 9 Left Lt Pressure (mmHg)IndexWaveform Comment +--------+------------------+-----+---------+-------+ [id number]     +--------+------------------+-----+---------+-------+ PTA 204 1.13 triphasic   +--------+------------------+-----+---------+-------+ DP 194 1.07 biphasic   +--------+------------------+-----+---------+-------+ +-------+-----------+-----------+------------+------------+ ABI/TBIT oday's ABIT oday's TBIPrevious ABIPrevious TBI +-------+-----------+-----------+------------+------------+ Right 1.06 0.8 1.26 0.65  +-------+-----------+-----------+------------+------------+ Left 1.13 amputation 1.15 amputation  +-------+-----------+-----------+------------+------------+ Previous ABI on 08/06/20 at [facility] Pedal pressures falsely elevated due to medial calcification. Summary: Right: Resting right ankle-brachial index is within normal range. The right toe-brachial index is normal. Left: Resting left ankle-brachial index is within normal range. READMISSION 08/04/2021 [name] is now a 67 year old man who I remember from this clinic many years  ago I think he had a right lower extremity predominantly venous wound at the time. He was here for 1 visit in March of this year had wounds on his right second and third toes we apparently dressed them many and they healed so he did not come back. He is listed in [place] is being a diabetic although the patient denies this says he is verified it with his primary doctor. In any case over the last several weeks or so according the patient although these wounds look somewhat more chronic than that he has developed predominantly large wounds on the right medial and right lateral ankle smaller areas on the left leg and areas on the dorsal aspect of his right second and third toes. Its not clear how he has been dressing these. 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 The patient has known PAD. He had an angiogram in August 2022 at which time he had nonhealing wounds in both feet. On the left his major vessels in the thigh were all  patent. He had three-vessel patent to the level of the ankle. He had a very short occlusion in the left anterior tibial. On the right lower extremity the major vessels in his thigh were all patent. He had three-vessel runoff to the foot sluggish filling of the anterior tibial artery. He was felt to have some component of small vessel disease but nothing that was amenable or needed revascularization. It was recommended that he have amputation of the second and third toes on the right foot if they did not heal He has been following with [name] of podiatry. [name] got him juxta lite stockings although I do not think he had them on properly he has uncontrolled edema in both legs Past medical history includes type 2 diabetes [although the patient really denies this], left TMA in 2022,lower extremity wounds in fact attendance at this clinic in 2009-2010, A-fib on Eliquis, chronic venous insufficiency with secondary lymphedema history of non-Hodgkin's lymphoma. 08-11-2021 upon evaluation today patient presents for follow-up evaluation he was seen last Wednesday initially for inspection here in our clinic. With that being said he tells me that he unfortunately has been having a lot of drainage and is actually coming through his wrap. Fortunately I do not see any evidence of active infection locally or systemically at this time which is great news. No fevers, chills, nausea, vomiting, or diarrhea. With that being said there does appear to be some evidence of local infection based on what I am seeing today. 08-18-2021 upon evaluation today patient appears to be doing okay currently in regard to his wounds with that being said that he is doing much better but still has a long ways to go to get to where he wanted to be. I think the infection is significantly improved. He has another week of the antibiotic at this point. 08-25-2021 upon evaluation today patient's wounds are actually doing decently well he has  erythema has significantly improved. I think the cellulitis is under controlled I am going to continue him on 2 more weeks of the Levaquin at 500 mg this is a lower dose but I am hoping it will be better for him. 09-08-2021 upon evaluation today patient appears to be doing excellent in regard to his wounds. Since I last saw him he was actually in the hospital where he ended up having a pacemaker put in. Subsequently  he tells me that he is actually doing quite well although they were unsure whether they were going to do it due to the fact that he had the wounds on his legs. And then I am glad they did anything seem to be doing well. 09-15-2021 upon evaluation patient's wounds are actually showing signs of improvement. The right side wounds do appear to have some need for sharp debridement today and I Georgina Peer go ahead and proceed with that. I think that if we get the wounds cleaned up he will actually show signs of continued improvement. I am also leaning towards switching to [facility] which I think will be a much better option for him. 09-22-2021 patient's wounds are showing signs of excellent improvement. I am actually extremely pleased with where we stand and I think that the patient is making great progress. There does not appear to be any signs of active infection. 09-29-2021 upon evaluation today patient appears to be doing excellent in regard to his wounds. He is actually tolerating the dressing changes without complication. Fortunately I see no evidence of active infection locally or systemically at this time which is great news and overall I am extremely pleased with where we stand currently. 09-2721 upon evaluation today patient actually appears to be doing excellent in regard to his wounds. The left leg is almost completely healed the right ankle is significantly smaller. Overall I am extremely pleased with where we stand at this point. I do not see any evidence of active infection at this  time. 10-13-2021 upon evaluation today patient appears to be doing excellent in regard to his wounds. I really feel like he is making good progress here and I am very pleased in that regard. Fortunately I do not see any signs of active infection at this time. We are using the [facility] topical antibiotic therapy. 10-20-2021 upon evaluation today patient appears to be doing well with regard to his wound on the right medial ankle region the left leg is almost completely healed. I am actually very pleased with where we stand today. 11-03-2021 upon evaluation today patient's wound actually is going require some sharp debridement but appears to be doing much better which is great news. Fortunately I do not see any signs of active infection at this time. 11-10-2021 upon evaluation today patient's wound is actually showing signs of excellent improvement. Fortunately I do not see any evidence of infection locally [name], [name] ([id number]) [id number] Page 3 of 9 or systemically which is great news and overall I am extremely pleased with where we stand today. I do believe he is making good progress he does have his [facility] topical antibiotics with him here today. 11-24-2021 upon evaluation today patient actually showing signs of excellent improvement this appears to be doing much better. Fortunately I do not see any evidence of infection locally or systemically at this time. 12-01-2021 upon evaluation today patient appears to be doing well currently in regard to his wound. He has been tolerating the dressing changes without complication. Fortunately I do not see any evidence of active infection at this time which is great news and overall I am extremely pleased with where we stand today. 12-15-2021 upon evaluation today patient appears to be doing well with regard to his wound. He is showing signs of improvement is slow but nonetheless we are making improvements here. Fortunately I  do not see any evidence of active infection locally nor systemically at this time. We are still using the [facility]  topical antibiotic over the open area only. 12-29-2021 upon evaluation today patient's wound actually is showing signs of excellent improvement. It has been 2 weeks since I perform any debridement and it definitely shows he has some tissue that needs to be cleaned away but I think we can do so quite easily and readily today. The good news is I do not see any signs of infection I think he is doing much better in that regard. Overall I am extremely pleased with where we stand. 01-12-2022 upon evaluation today patient appears to be doing well currently in regard to his wound although it is not getting significantly smaller it is also not getting any larger. Fortunately I do not see any evidence of infection locally nor systemically which is great news and overall I am extremely pleased with where things stand currently. 01-19-2022 upon evaluation today patient appears to be doing well currently in regard to his wound. The PolyMem actually seems to have done extremely well for him. Fortunately I do not see any signs of infection locally nor systemically at this time. 01-26-2022 upon evaluation today patient appears to be making progress. Fortunately there does not appear to be any signs of infection which is great news. No fevers, chills, nausea, vomiting, or diarrhea. With that being said this is very slow to heal and although it is smaller I still feel like we may want to try to do something to speed this up I think that a skin substitute could be beneficial, look into Kerecis. 02-02-2022 upon evaluation today patient's wound is actually showing signs of excellent improvement. I do not see any evidence of infection and overall I think that we are headed in the right direction. Fortunately I think that he is tolerating the dressing changes without complication. 02-09-2022 upon evaluation today  patient appears to be doing well currently in regard to his wound. It does look a little bit macerated but fortunately does not appear to be showing any signs of significant skin breakdown over the macerated area which is good news. Fortunately I do not see any evidence of active infection locally nor systemically at this point which is great news. Electronic Signature(s) Signed: 02/09/2022 4:39:57 PM By: [name] PA-C Entered By: [name] on 02/09/2022 16:39:57 -------------------------------------------------------------------------------- Physical Exam Details Patient Name: Date of Service: [name], [name]. 02/09/2022 3:00 PM Medical Record Number: [id number] Patient Account Number: [id number] Date of Birth/Sex: Treating RN: 1955/11/20 (67 y.o. M) Primary Care Provider: [name] Other Clinician: Referring Provider: Treating Provider/Extender: [name] in Treatment: 27 Constitutional Well-nourished and well-hydrated in no acute distress. Respiratory normal breathing without difficulty. Psychiatric this patient is able to make decisions and demonstrates good insight into disease process. Alert and Oriented x 3. pleasant and cooperative. Notes Upon inspection patient's wound bed actually showed signs again of improvement based on what I am seeing with regard to the overall appearance of the surface of the wound. Unfortunately I think that the PolyMem may be causing a bit more maceration and I would like to see him think of switching back to the silver alginate using a little bit of Desitin around the area and working to see how this goes. Electronic Signature(s) Signed: 02/09/2022 4:40:17 PM By: [name] PA-C Entered By: [name] on 02/09/2022 16:40:17 [name], [name] ([id number]) [id number].pdf Page 4 of 9 -------------------------------------------------------------------------------- Physician Orders  Details Patient Name: Date of Service: [name], [name]. 02/09/2022 3:00 PM Medical  Record Number: [id number] Patient Account Number: [id number] Date of Birth/Sex: Treating RN: March 17, 1955 (67 y.o. [name], [name] Primary Care Provider: [name] Other Clinician: Referring Provider: Treating Provider/Extender: [name] in Treatment: 27 Verbal / Phone Orders: No Diagnosis Coding ICD-10 Coding Code Description I89.0 Lymphedema, not elsewhere classified I87.333 Chronic venous hypertension (idiopathic) with ulcer and inflammation of bilateral lower extremity L97.828 Non-pressure chronic ulcer of other part of left lower leg with other specified severity L97.818 Non-pressure chronic ulcer of other part of right lower leg with other specified severity E11.621 Type 2 diabetes mellitus with foot ulcer L97.518 Non-pressure chronic ulcer of other part of right foot with other specified severity Follow-up Appointments ppointment in 1 week. Margarita Grizzle on Wednesday 02/16/22 -> change to nurse visit Return A ppointment in 2 weeks. Margarita Grizzle on Wednesday 02/24/32 @ 3:15 Rm # 9 w/ lauran Return A Nurse Visit: - 02/16/22 Anesthetic (In clinic) Topical Lidocaine 5% applied to wound bed Cellular or Tissue Based Products Cellular or Tissue Based Product Type: - Run IVR for [employer] May shower with protection but do not get wound dressing(s) wet. Protect dressing(s) with water repellant cover (for example, large plastic bag) or a cast cover and may then take shower. Edema Control - Lymphedema / SCD / Other Elevate legs to the level of the heart or above for 30 minutes daily and/or when sitting for 3-4 times a day throughout the day. Avoid standing for long periods of time. Exercise regularly Moisturize legs daily. - apply every night before bed to left leg. Compression stocking or Garment 30-40 mm/Hg pressure to: - wear your VIVE compression stocking  to left leg. apply in the morning and remove at night. Will apply tubigrip size [name] to left leg. Remove once you get home and use your compression stockings. Wound Treatment Wound #7 - Malleolus Wound Laterality: Right, Medial Peri-Wound Care: Triamcinolone 15 (g) 1 x Per Week/30 Days Discharge Instructions: Use triamcinolone 15 (g) as directed Peri-Wound Care: Sween Lotion (Moisturizing lotion) 1 x Per Week/30 Days Discharge Instructions: Apply moisturizing lotion as directed Prim Dressing: Sorbalgon AG Dressing, 4x4 (in/in) 1 x Per Week/30 Days ary Discharge Instructions: Apply to wound bed as instructed Secondary Dressing: Woven Gauze Sponge, Non-Sterile 4x4 in 1 x Per Week/30 Days Discharge Instructions: Apply over primary dressing as directed. Secondary Dressing: Zetuvit Plus 4x8 in 1 x Per Week/30 Days Discharge Instructions: Apply over primary dressing as directed. Compression Wrap: ThreePress (3 layer compression wrap) 1 x Per Week/30 Days Discharge Instructions: Apply three layer use kerlix instead of cotton compression as directed. Electronic Signature(s) Signed: 02/09/2022 4:50:42 PM By: [name] PA-C Signed: 02/11/2022 8:21:34 AM By: Rhae Hammock RN Gulas, [name] ([id number]) [id number] Page 5 of 9 Entered By: Rhae Hammock on 02/09/2022 15:58:00 -------------------------------------------------------------------------------- Problem List Details Patient Name: Date of Service: [name], [name] 02/09/2022 3:00 PM Medical Record Number: [id number] Patient Account Number: [id number] Date of Birth/Sex: Treating RN: [birth year] (67 y.o. M) Primary Care Provider: [name] Other Clinician: Referring Provider: Treating Provider/Extender: [name] in Treatment: 27 Active Problems ICD-10 Encounter Code Description Active Date MDM Diagnosis I89.0 Lymphedema, not elsewhere classified 08/04/2021 No Yes I87.333  Chronic venous hypertension (idiopathic) with ulcer and inflammation of 08/04/2021 No Yes bilateral lower extremity L97.828 Non-pressure chronic ulcer of other part of left lower leg with other specified 08/04/2021 No Yes severity L97.818 Non-pressure chronic ulcer of other part of right lower  leg with other specified 08/04/2021 No Yes severity E11.621 Type 2 diabetes mellitus with foot ulcer 08/04/2021 No Yes L97.518 Non-pressure chronic ulcer of other part of right foot with other specified 08/04/2021 No Yes severity Inactive Problems Resolved Problems Electronic Signature(s) Signed: 02/09/2022 3:11:35 PM By: [name] PA-C Entered By: [name] on 02/09/2022 15:11:34 -------------------------------------------------------------------------------- Progress Note Details Patient Name: Date of Service: [name], [name]. 02/09/2022 3:00 PM Medical Record Number: [id number] Patient Account Number: [id number] Date of Birth/Sex: Treating RN: March 08, 1955 (67 y.o. M) Primary Care Provider: [name] Other Clinician: Referring Provider: Treating Provider/Extender: Roxy Manns [employer], [place] [name] ([id number]) [phone].pdf Page 6 of 9 Weeks in Treatment: 27 Subjective Chief Complaint Information obtained from Patient 08/04/2021; patient returns to clinic with bilateral leg wounds as well as areas on the right foot History of Present Illness (HPI) ADMISSION 03/22/2021 This is a 67 year old man with a past medical history significant for diabetes type 2, congestive heart failure, peripheral arterial disease, morbid obesity, venous insufficiency, and coronary artery disease. He has been followed by [name] in podiatry, who performed a transmetatarsal amputation on the left foot in August 2022. He had issues healing that wound, but based upon Dr. [name] notes, ultimately the TMA wound healed. During his recovery from that surgery, however, ulcers  opened up over the DIP joint of the right second and third toe. These have apparently closed and reopened multiple times. It sounds like one of the issues has been moisture accumulation and maceration of the tissues causing them to reopen. At his last visit with [name], on March 01, 2021, there continues to be problems with moisture and he was referred to wound care for further evaluation and management. He had a formal aortogram with runoff performed prior to his TMA. The findings are copied here: Patient has inline flow to both feet with no significant flow-limiting lesion that would be amenable to percutaneous or open revascularization. He does have an element of small vessel disease and has a short segment occlusion of the distal anterior tibial/dorsalis pedis artery on the left foot but does have posterior tibial artery flow. Would recommend management of wounds with amputation of toes 2 and 3 on the right foot if the wounds do not heal and deteriorate. Transmetatarsal amputation on the left side has as good a blood supply as it is going to get and hopefully this will heal in the future. Formal ABIs were done in January 2023. They are normal bilaterally. ABI Findings: +---------+------------------+-----+----------+--------+ Right Rt Pressure (mmHg)IndexWaveform Comment  +---------+------------------+-----+----------+--------+ Brachial 160    +---------+------------------+-----+----------+--------+ PTA 192 1.06 monophasic  +---------+------------------+-----+----------+--------+ DP 159 0.88 monophasic  +---------+------------------+-----+----------+--------+ Great T oe145 0.80    +---------+------------------+-----+----------+--------+ +--------+------------------+-----+---------+-------+ Left Lt Pressure (mmHg)IndexWaveform Comment +--------+------------------+-----+---------+-------+ [id number]     +--------+------------------+-----+---------+-------+ PTA 204 1.13 triphasic  +--------+------------------+-----+---------+-------+ DP 194 1.07 biphasic   +--------+------------------+-----+---------+-------+ +-------+-----------+-----------+------------+------------+ ABI/TBIT oday's ABIT oday's TBIPrevious ABIPrevious TBI +-------+-----------+-----------+------------+------------+ Right 1.06 0.8 1.26 0.65  +-------+-----------+-----------+------------+------------+ Left 1.13 amputation 1.15 amputation  +-------+-----------+-----------+------------+------------+ Previous ABI on 08/06/20 at [facility] Pedal pressures falsely elevated due to medial calcification. Summary: Right: Resting right ankle-brachial index is within normal range. The right toe-brachial index is normal. Left: Resting left ankle-brachial index is within normal range. READMISSION 08/04/2021 Mr. Hann is now a 67 year old man who I remember from this clinic many years ago I think he had a right lower extremity predominantly venous wound at the time. He was here for 1 visit in March of this year had  wounds on his right second and third toes we apparently dressed them many and they healed so he did not come back. He is listed in [place] is being a diabetic although the patient denies this says he is verified it with his primary doctor. In any case over the last several weeks or so according the patient although these wounds look somewhat more chronic than that he has developed predominantly large wounds on the right medial and right lateral ankle smaller areas on the left leg and areas on the dorsal aspect of his right second and third toes. Its not clear how he has been dressing these. 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 The patient has known PAD. He had an angiogram in August 2022 at which time he had  nonhealing wounds in both feet. On the left his major vessels in the thigh were all patent. He had three-vessel patent to the level of the ankle. He had a very short occlusion in the left anterior tibial. On the right lower extremity the major vessels in his thigh were all patent. He had three-vessel runoff to the foot sluggish filling of the anterior tibial artery. He was felt to have some component of small vessel disease but nothing that was amenable or needed revascularization. It was recommended that he have amputation of the second and third toes on the right foot if they did not heal He has been following with [name] of podiatry. [name] got him juxta lite stockings although I do not think he had them on properly he has uncontrolled edema in both legs AVYUKTH, BONTEMPO [name] ([id number]) [phone].pdf Page 7 of 9 Past medical history includes type 2 diabetes [although the patient really denies this], left TMA in 2022,lower extremity wounds in fact attendance at this clinic in 2009-2010, A-fib on Eliquis, chronic venous insufficiency with secondary lymphedema history of non-Hodgkin's lymphoma. 08-11-2021 upon evaluation today patient presents for follow-up evaluation he was seen last Wednesday initially for inspection here in our clinic. With that being said he tells me that he unfortunately has been having a lot of drainage and is actually coming through his wrap. Fortunately I do not see any evidence of active infection locally or systemically at this time which is great news. No fevers, chills, nausea, vomiting, or diarrhea. With that being said there does appear to be some evidence of local infection based on what I am seeing today. 08-18-2021 upon evaluation today patient appears to be doing okay currently in regard to his wounds with that being said that he is doing much better but still has a long ways to go to get to where he wanted to be. I think the infection is  significantly improved. He has another week of the antibiotic at this point. 08-25-2021 upon evaluation today patient's wounds are actually doing decently well he has erythema has significantly improved. I think the cellulitis is under controlled I am going to continue him on 2 more weeks of the Levaquin at 500 mg this is a lower dose but I am hoping it will be better for him. 09-08-2021 upon evaluation today patient appears to be doing excellent in regard to his wounds. Since I last saw him he was actually in the hospital where he ended up having a pacemaker put in. Subsequently he tells me that he is actually doing quite well although they were unsure whether they were going  to do it due to the fact that he had the wounds on his legs. And then I am glad they did anything seem to be doing well. 09-15-2021 upon evaluation patient's wounds are actually showing signs of improvement. The right side wounds do appear to have some need for sharp debridement today and I Georgina Peer go ahead and proceed with that. I think that if we get the wounds cleaned up he will actually show signs of continued improvement. I am also leaning towards switching to [facility] which I think will be a much better option for him. 09-22-2021 patient's wounds are showing signs of excellent improvement. I am actually extremely pleased with where we stand and I think that the patient is making great progress. There does not appear to be any signs of active infection. 09-29-2021 upon evaluation today patient appears to be doing excellent in regard to his wounds. He is actually tolerating the dressing changes without complication. Fortunately I see no evidence of active infection locally or systemically at this time which is great news and overall I am extremely pleased with where we stand currently. 09-2721 upon evaluation today patient actually appears to be doing excellent in regard to his wounds. The left leg is almost completely healed  the right ankle is significantly smaller. Overall I am extremely pleased with where we stand at this point. I do not see any evidence of active infection at this time. 10-13-2021 upon evaluation today patient appears to be doing excellent in regard to his wounds. I really feel like he is making good progress here and I am very pleased in that regard. Fortunately I do not see any signs of active infection at this time. We are using the [facility] topical antibiotic therapy. 10-20-2021 upon evaluation today patient appears to be doing well with regard to his wound on the right medial ankle region the left leg is almost completely healed. I am actually very pleased with where we stand today. 11-03-2021 upon evaluation today patient's wound actually is going require some sharp debridement but appears to be doing much better which is great news. Fortunately I do not see any signs of active infection at this time. 11-10-2021 upon evaluation today patient's wound is actually showing signs of excellent improvement. Fortunately I do not see any evidence of infection locally or systemically which is great news and overall I am extremely pleased with where we stand today. I do believe he is making good progress he does have his [facility] topical antibiotics with him here today. 11-24-2021 upon evaluation today patient actually showing signs of excellent improvement this appears to be doing much better. Fortunately I do not see any evidence of infection locally or systemically at this time. 12-01-2021 upon evaluation today patient appears to be doing well currently in regard to his wound. He has been tolerating the dressing changes without complication. Fortunately I do not see any evidence of active infection at this time which is great news and overall I am extremely pleased with where we stand today. 12-15-2021 upon evaluation today patient appears to be doing well with regard to his wound. He is showing signs of  improvement is slow but nonetheless we are making improvements here. Fortunately I do not see any evidence of active infection locally nor systemically at this time. We are still using the [facility] topical antibiotic over the open area only. 12-29-2021 upon evaluation today patient's wound actually is showing signs of excellent improvement. It has been 2 weeks since I  perform any debridement and it definitely shows he has some tissue that needs to be cleaned away but I think we can do so quite easily and readily today. The good news is I do not see any signs of infection I think he is doing much better in that regard. Overall I am extremely pleased with where we stand. 01-12-2022 upon evaluation today patient appears to be doing well currently in regard to his wound although it is not getting significantly smaller it is also not getting any larger. Fortunately I do not see any evidence of infection locally nor systemically which is great news and overall I am extremely pleased with where things stand currently. 01-19-2022 upon evaluation today patient appears to be doing well currently in regard to his wound. The PolyMem actually seems to have done extremely well for him. Fortunately I do not see any signs of infection locally nor systemically at this time. 01-26-2022 upon evaluation today patient appears to be making progress. Fortunately there does not appear to be any signs of infection which is great news. No fevers, chills, nausea, vomiting, or diarrhea. With that being said this is very slow to heal and although it is smaller I still feel like we may want to try to do something to speed this up I think that a skin substitute could be beneficial, look into Kerecis. 02-02-2022 upon evaluation today patient's wound is actually showing signs of excellent improvement. I do not see any evidence of infection and overall I think that we are headed in the right direction. Fortunately I think that he is  tolerating the dressing changes without complication. 02-09-2022 upon evaluation today patient appears to be doing well currently in regard to his wound. It does look a little bit macerated but fortunately does not appear to be showing any signs of significant skin breakdown over the macerated area which is good news. Fortunately I do not see any evidence of active infection locally nor systemically at this point which is great news. Objective Constitutional Well-nourished and well-hydrated in no acute distress. [name], [name] ([id number]) [id number] Page 8 of 9 Vitals Time Taken: 3:44 PM, Height: 71 in, Weight: 350 lbs, BMI: 48.8, Temperature: 98.7 F, Pulse: 74 bpm, Respiratory Rate: 17 breaths/min, Blood Pressure: 138/74 mmHg. Respiratory normal breathing without difficulty. Psychiatric this patient is able to make decisions and demonstrates good insight into disease process. Alert and Oriented x 3. pleasant and cooperative. General Notes: Upon inspection patient's wound bed actually showed signs again of improvement based on what I am seeing with regard to the overall appearance of the surface of the wound. Unfortunately I think that the PolyMem may be causing a bit more maceration and I would like to see him think of switching back to the silver alginate using a little bit of Desitin around the area and working to see how this goes. Integumentary (Hair, Skin) Wound #7 status is Open. Original cause of wound was Blister. The date acquired was: 07/21/2021. The wound has been in treatment 27 weeks. The wound is located on the Right,Medial Malleolus. The wound measures 4.7cm length x 3.7cm width x 0.2cm depth; 13.658cm^2 area and 2.732cm^3 volume. There is no tunneling or undermining noted. There is a medium amount of serosanguineous drainage noted. The wound margin is distinct with the outline attached to the wound base. There is large (67-100%) red granulation within  the wound bed. There is a small (1-33%) amount of necrotic tissue within the wound bed including Adherent Slough.  The periwound skin appearance exhibited: Dry/Scaly. The periwound skin appearance did not exhibit: Callus, Crepitus, Excoriation, Induration, Rash, Scarring, Maceration, Atrophie Blanche, Cyanosis, Ecchymosis, Hemosiderin Staining, Mottled, Pallor, Rubor, Erythema. Assessment Active Problems ICD-10 Lymphedema, not elsewhere classified Chronic venous hypertension (idiopathic) with ulcer and inflammation of bilateral lower extremity Non-pressure chronic ulcer of other part of left lower leg with other specified severity Non-pressure chronic ulcer of other part of right lower leg with other specified severity Type 2 diabetes mellitus with foot ulcer Non-pressure chronic ulcer of other part of right foot with other specified severity Plan Follow-up Appointments: Return Appointment in 1 week. Margarita Grizzle on Wednesday 02/16/22 -> change to nurse visit Return Appointment in 2 weeks. Margarita Grizzle on Wednesday 02/24/32 @ 3:15 Rm # 9 w/ lauran Nurse Visit: - 02/16/22 Anesthetic: (In clinic) Topical Lidocaine 5% applied to wound bed Cellular or Tissue Based Products: Cellular or Tissue Based Product Type: - Run IVR for [employer] Shower/ Hygiene: May shower with protection but do not get wound dressing(s) wet. Protect dressing(s) with water repellant cover (for example, large plastic bag) or a cast cover and may then take shower. Edema Control - Lymphedema / SCD / Other: Elevate legs to the level of the heart or above for 30 minutes daily and/or when sitting for 3-4 times a day throughout the day. Avoid standing for long periods of time. Exercise regularly Moisturize legs daily. - apply every night before bed to left leg. Compression stocking or Garment 30-40 mm/Hg pressure to: - wear your VIVE compression stocking to left leg. apply in the morning and remove at night. Will apply tubigrip  size [name] to left leg. Remove once you get home and use your compression stockings. WOUND #7: - Malleolus Wound Laterality: Right, Medial Peri-Wound Care: Triamcinolone 15 (g) 1 x Per Week/30 Days Discharge Instructions: Use triamcinolone 15 (g) as directed Peri-Wound Care: Sween Lotion (Moisturizing lotion) 1 x Per Week/30 Days Discharge Instructions: Apply moisturizing lotion as directed Prim Dressing: Sorbalgon AG Dressing, 4x4 (in/in) 1 x Per Week/30 Days ary Discharge Instructions: Apply to wound bed as instructed Secondary Dressing: Woven Gauze Sponge, Non-Sterile 4x4 in 1 x Per Week/30 Days Discharge Instructions: Apply over primary dressing as directed. Secondary Dressing: Zetuvit Plus 4x8 in 1 x Per Week/30 Days Discharge Instructions: Apply over primary dressing as directed. Com pression Wrap: ThreePress (3 layer compression wrap) 1 x Per Week/30 Days Discharge Instructions: Apply three layer use kerlix instead of cotton compression as directed. 1. I would recommend currently that we going discontinue PolyMem we will switch back to the silver alginate which I think is probably going to be the best way to go. 2. I recommend that the patient should continue to monitor for any signs of infection or worsening. Based on what I am seeing I believe that we are really making some decent progress overall. 3. I am good recommend that we have the patient go ahead and continue to elevate his legs much as possible to help with edema control. 4. I am also going to suggest that the patient should continue to get up and move as well as elevate his legs when he is having to sit longer periods of time. T o this and also did give him a note for jury duty today as I feel like sitting for long periods for jury duty would be not very conducive for him as far as that is concerned. He was appreciative in that regard. [name], [name] ([id number]) [id number] Page  9 of 9 We will see  patient back for reevaluation in 1 week here in the clinic. If anything worsens or changes patient will contact our office for additional recommendations. Electronic Signature(s) Signed: 02/09/2022 4:42:58 PM By: [name] PA-C Entered By: [name] on 02/09/2022 16:42:58 -------------------------------------------------------------------------------- SuperBill Details Patient Name: Date of Service: Kinnan, [name]. 02/09/2022 Medical Record Number: [id number] Patient Account Number: [id number] Date of Birth/Sex: Treating RN: [birth year] (67 y.o. M) Primary Care Provider: [name] Other Clinician: Referring Provider: Treating Provider/Extender: [name] in Treatment: 27 Diagnosis Coding ICD-10 Codes Code Description I89.0 Lymphedema, not elsewhere classified I87.333 Chronic venous hypertension (idiopathic) with ulcer and inflammation of bilateral lower extremity L97.828 Non-pressure chronic ulcer of other part of left lower leg with other specified severity L97.818 Non-pressure chronic ulcer of other part of right lower leg with other specified severity E11.621 Type 2 diabetes mellitus with foot ulcer L97.518 Non-pressure chronic ulcer of other part of right foot with other specified severity Physician Procedures : CPT4 Code Description Modifier [id number] - WC PHYS LEVEL 3 - EST PT ICD-10 Diagnosis Description I89.0 Lymphedema, not elsewhere classified I87.333 Chronic venous hypertension (idiopathic) with ulcer and inflammation of bilateral lower extremity  L97.828 Non-pressure chronic ulcer of other part of left lower leg with other specified severity L97.818 Non-pressure chronic ulcer of other part of right lower leg with other specified severity Quantity: 1 Electronic Signature(s) Signed: 02/09/2022 4:43:29 PM By: [name] PA-C Entered By: [name] on 02/09/2022 16:43:29

## 2022-02-11 NOTE — Progress Notes (Signed)
WILLEY, DUE ([id number]) [id number] Page 1 of 6 Visit Report for 02/09/2022 Arrival Information Details Patient Name: Date of Service: [name], [name] 02/09/2022 3:00 PM Medical Record Number: [id number] Patient Account Number: [id number] Date of Birth/Sex: Treating RN: 28-Jun-1955 (67 y.o. [name], [name] Primary Care Hillery Zachman: [name] Other Clinician: Referring Sakeenah Valcarcel: Treating Novis League/Extender: [name] in Treatment: 27 Visit Information History Since Last Visit Added or deleted any medications: No Patient Arrived: Gilford Rile Any new allergies or adverse reactions: No Arrival Time: 15:43 Had a fall or experienced change in No Accompanied By: self activities of daily living that may affect Transfer Assistance: None risk of falls: Patient Identification Verified: Yes Signs or symptoms of abuse/neglect since last visito No Secondary Verification Process Completed: Yes Hospitalized since last visit: No Patient Requires Transmission-Based Precautions: No Implantable device outside of the clinic excluding No Patient Has Alerts: Yes cellular tissue based products placed in the center Patient Alerts: Patient on Blood Thinner since last visit: 01/2021 ABI L 1.13 R 1.06 Has Dressing in Place as Prescribed: Yes 01/2021 TBI L amp R 0.8 Has Compression in Place as Prescribed: Yes Pain Present Now: No Electronic Signature(s) Signed: 02/11/2022 8:21:34 AM By: Rhae Hammock RN Entered By: Rhae Hammock on 02/09/2022 15:44:07 -------------------------------------------------------------------------------- Encounter Discharge Information Details Patient Name: Date of Service: [name], [name] 02/09/2022 3:00 PM Medical Record Number: [id number] Patient Account Number: [id number] Date of Birth/Sex: Treating RN: 1955-03-20 (67 y.o. [name], [name] Primary Care Jaycub Noorani: [name] Other Clinician: Referring  Sharine Cadle: Treating Renald Haithcock/Extender: [name] in Treatment: 27 Encounter Discharge Information Items Discharge Condition: Stable Ambulatory Status: Ambulatory Discharge Destination: Home Transportation: Private Auto Accompanied By: self Schedule Follow-up Appointment: Yes Clinical Summary of Care: Patient Declined Electronic Signature(s) Signed: 02/11/2022 8:21:34 AM By: Rhae Hammock RN Entered By: Rhae Hammock on 02/09/2022 16:14:39 Dawe, Buck D ([id number]) [id number] Page 2 of 6 -------------------------------------------------------------------------------- Lower Extremity Assessment Details Patient Name: Date of Service: [name], [name] 02/09/2022 3:00 PM Medical Record Number: [id number] Patient Account Number: [id number] Date of Birth/Sex: Treating RN: 1955-10-14 (67 y.o. [name], [name] Primary Care Airiana Elman: [name] Other Clinician: Referring Joshu Furukawa: Treating Vina Byrd/Extender: [name] in Treatment: 27 Edema Assessment Assessed: [name]: No] [name]: Yes] Edema: [Left: N] [Right: o] Calf Left: Right: Point of Measurement: 31 cm From Medial Instep 38.2 cm Ankle Left: Right: Point of Measurement: 11 cm From Medial Instep 26 cm Vascular Assessment Pulses: Dorsalis Pedis Palpable: [Right:Yes] Posterior Tibial Palpable: [Right:Yes] Electronic Signature(s) Signed: 02/11/2022 8:21:34 AM By: Rhae Hammock RN Entered By: Rhae Hammock on 02/09/2022 15:44:40 -------------------------------------------------------------------------------- Multi-Disciplinary Care Plan Details Patient Name: Date of Service: [name], [name] 02/09/2022 3:00 PM Medical Record Number: [id number] Patient Account Number: [id number] Date of Birth/Sex: Treating RN: 1955-03-14 (67 y.o. [name], [name] Primary Care Kaylanni Ezelle: [name] Other Clinician: Referring Dior Dominik: Treating  Debroh Sieloff/Extender: [name] in Treatment: Garnet reviewed with physician Active Inactive Pain, Acute or Chronic Nursing Diagnoses: Pain, acute or chronic: actual or potential Potential alteration in comfort, pain Goals: Patient will verbalize adequate pain control and receive pain control interventions during procedures as needed Date Initiated: 08/04/2021 Target Resolution Date: 02/12/2022 Goal Status: Active Patient/caregiver will verbalize comfort level met JABARI, SWOVELAND ([id number]) [id number] Page 3 of 6 Date Initiated: 08/04/2021 Target Resolution Date: 02/11/2022 Goal Status: Active Interventions: Encourage patient to take pain medications as prescribed Provide education on  pain management Reposition patient for comfort Treatment Activities: Administer pain control measures as ordered : 08/04/2021 Notes: Electronic Signature(s) Signed: 02/11/2022 8:21:34 AM By: Rhae Hammock RN Entered By: Rhae Hammock on 02/09/2022 15:53:14 -------------------------------------------------------------------------------- Pain Assessment Details Patient Name: Date of Service: [name], [name] 02/09/2022 3:00 PM Medical Record Number: [id number] Patient Account Number: [id number] Date of Birth/Sex: Treating RN: 1955/12/28 (67 y.o. [name], [name] Primary Care Malaka Ruffner: [name] Other Clinician: Referring Cambry Spampinato: Treating Malory Spurr/Extender: [name] in Treatment: 27 Active Problems Location of Pain Severity and Description of Pain Patient Has Paino No Site Locations Pain Management and Medication Current Pain Management: Electronic Signature(s) Signed: 02/11/2022 8:21:34 AM By: Rhae Hammock RN Entered By: Rhae Hammock on 02/09/2022 15:44:32 Patient/Caregiver Education  Details -------------------------------------------------------------------------------- [name] Alt D ([id number]) [phone].pdf Page 4 of 6 Patient Name: Date of Service: [name], [name] 1/31/2024andnbsp3:00 PM Medical Record Number: [id number] Patient Account Number: [id number] Date of Birth/Gender: Treating RN: 1955/10/10 (67 y.o. [name] Noe Primary Care Physician: [name] Other Clinician: Referring Physician: Treating Physician/Extender: [name] in Treatment: 27 Education Assessment Education Provided To: Patient Education Topics Provided Wound/Skin Impairment: Methods: Explain/Verbal Responses: Reinforcements needed, State content correctly [employer]) Signed: 02/11/2022 8:21:34 AM By: Rhae Hammock RN Entered By: Rhae Hammock on 02/09/2022 15:53:51 -------------------------------------------------------------------------------- Wound Assessment Details Patient Name: Date of Service: [name], [name] 02/09/2022 3:00 PM Medical Record Number: [id number] Patient Account Number: [id number] Date of Birth/Sex: Treating RN: 1955/08/15 (67 y.o. [name], [name] Primary Care Nazia Rhines: [name] Other Clinician: Referring Joye Wesenberg: Treating Brando Taves/Extender: [name] in Treatment: 27 Wound Status Wound Number: 7 Primary Venous Leg Ulcer Etiology: Wound Location: Right, Medial Malleolus Secondary Lymphedema Wounding Event: Blister Etiology: Date Acquired: 07/21/2021 Wound Open Weeks Of Treatment: 27 Status: Clustered Wound: No Comorbid Sleep Apnea, Arrhythmia, Congestive Heart Failure, History: Hypertension, Hypotension, Peripheral Arterial Disease, Peripheral Venous Disease, Type II Diabetes, Gout, Osteoarthritis Photos Wound Measurements Length: (cm) 4.7 Width: (cm) 3.7 Depth: (cm) 0.2 Area: (cm) 13.658 Volume: (cm) 2.732 % Reduction in Area: 64.3% %  Reduction in Volume: 89.8% Epithelialization: Small (1-33%) Tunneling: No Undermining: No Wound Description Classification: Full Thickness With Exposed Support Structures [name], [name] ([id number]) Wound Margin: Distinct, outline attached Exudate Amount: Medium Exudate Type: Serosanguineous Exudate Color: red, brown Foul Odor After Cleansing: No [id number] Page 5 of 6 Slough/Fibrino No Wound Bed Granulation Amount: Large (67-100%) Granulation Quality: Red Necrotic Amount: Small (1-33%) Necrotic Quality: Adherent Slough Periwound Skin Texture Texture Color No Abnormalities Noted: No No Abnormalities Noted: No Callus: No Atrophie Blanche: No Crepitus: No Cyanosis: No Excoriation: No Ecchymosis: No Induration: No Erythema: No Rash: No Hemosiderin Staining: No Scarring: No Mottled: No Pallor: No Moisture Rubor: No No Abnormalities Noted: No Dry / Scaly: Yes Maceration: No Treatment Notes Wound #7 (Malleolus) Wound Laterality: Right, Medial Cleanser Peri-Wound Care Triamcinolone 15 (g) Discharge Instruction: Use triamcinolone 15 (g) as directed Sween Lotion (Moisturizing lotion) Discharge Instruction: Apply moisturizing lotion as directed Topical Primary Dressing Sorbalgon AG Dressing, 4x4 (in/in) Discharge Instruction: Apply to wound bed as instructed Secondary Dressing Woven Gauze Sponge, Non-Sterile 4x4 in Discharge Instruction: Apply over primary dressing as directed. Zetuvit Plus 4x8 in Discharge Instruction: Apply over primary dressing as directed. Secured With Compression Wrap ThreePress (3 layer compression wrap) Discharge Instruction: Apply three layer use kerlix instead of cotton compression as directed. Compression Stockings Add-Ons Electronic Signature(s) Signed: 02/10/2022 5:30:52 PM By: Deon Pilling RN, BSN  Entered By: Deon Pilling on 02/09/2022  15:46:42 -------------------------------------------------------------------------------- Vitals Details Patient Name: Date of Service: [name], [name] 02/09/2022 3:00 PM Gutierres, Kasandra Knudsen D ([id number]) [id number] Page 6 of 6 Medical Record Number: [id number] Patient Account Number: [id number] Date of Birth/Sex: Treating RN: 1955/05/31 (67 y.o. [name], [name] Primary Care Mehar Kirkwood: [name] Other Clinician: Referring Carloyn Lahue: Treating Elzena Muston/Extender: [name] in Treatment: 27 Vital Signs Time Taken: 15:44 Temperature (F): 98.7 Height (in): 71 Pulse (bpm): 74 Weight (lbs): 350 Respiratory Rate (breaths/min): 17 Body Mass Index (BMI): 48.8 Blood Pressure (mmHg): 138/74 Reference Range: 80 - 120 mg / dl Electronic Signature(s) Signed: 02/11/2022 8:21:34 AM By: Rhae Hammock RN Entered By: Rhae Hammock on 02/09/2022 15:44:27

## 2022-02-14 DIAGNOSIS — E119 Type 2 diabetes mellitus without complications: Secondary | ICD-10-CM | POA: Diagnosis not present

## 2022-02-16 ENCOUNTER — Encounter (HOSPITAL_BASED_OUTPATIENT_CLINIC_OR_DEPARTMENT_OTHER): Payer: Medicare Other | Attending: Physician Assistant | Admitting: Internal Medicine

## 2022-02-16 DIAGNOSIS — Z89432 Acquired absence of left foot: Secondary | ICD-10-CM | POA: Diagnosis not present

## 2022-02-16 DIAGNOSIS — I890 Lymphedema, not elsewhere classified: Secondary | ICD-10-CM | POA: Diagnosis not present

## 2022-02-16 DIAGNOSIS — Z950 Presence of cardiac pacemaker: Secondary | ICD-10-CM | POA: Diagnosis not present

## 2022-02-16 DIAGNOSIS — Z8572 Personal history of non-Hodgkin lymphomas: Secondary | ICD-10-CM | POA: Diagnosis not present

## 2022-02-16 DIAGNOSIS — L97518 Non-pressure chronic ulcer of other part of right foot with other specified severity: Secondary | ICD-10-CM | POA: Insufficient documentation

## 2022-02-16 DIAGNOSIS — L97828 Non-pressure chronic ulcer of other part of left lower leg with other specified severity: Secondary | ICD-10-CM | POA: Diagnosis not present

## 2022-02-16 DIAGNOSIS — E11621 Type 2 diabetes mellitus with foot ulcer: Secondary | ICD-10-CM | POA: Insufficient documentation

## 2022-02-16 DIAGNOSIS — I87333 Chronic venous hypertension (idiopathic) with ulcer and inflammation of bilateral lower extremity: Secondary | ICD-10-CM | POA: Diagnosis not present

## 2022-02-16 DIAGNOSIS — Z6841 Body Mass Index (BMI) 40.0 and over, adult: Secondary | ICD-10-CM | POA: Diagnosis not present

## 2022-02-16 DIAGNOSIS — Z7901 Long term (current) use of anticoagulants: Secondary | ICD-10-CM | POA: Diagnosis not present

## 2022-02-16 DIAGNOSIS — L97818 Non-pressure chronic ulcer of other part of right lower leg with other specified severity: Secondary | ICD-10-CM | POA: Diagnosis not present

## 2022-02-16 DIAGNOSIS — E1151 Type 2 diabetes mellitus with diabetic peripheral angiopathy without gangrene: Secondary | ICD-10-CM | POA: Insufficient documentation

## 2022-02-16 DIAGNOSIS — I4891 Unspecified atrial fibrillation: Secondary | ICD-10-CM | POA: Diagnosis not present

## 2022-02-16 NOTE — Progress Notes (Signed)
SANDER, REMEDIOS ([id number]) [id number] Page 1 of 1 Visit Report for 02/16/2022 SuperBill Details Patient Name: Date of Service: [name], [name] 02/16/2022 Medical Record Number: [id number] Patient Account Number: [id number] Date of Birth/Sex: Treating RN: [birth year] (67 y.o. M) Primary Care Provider: Marton [name] Other Clinician: Referring Provider: Treating Provider/Extender: Estelle Grumbles in Treatment: 28 Diagnosis Coding ICD-10 Codes Code Description I89.0 Lymphedema, not elsewhere classified I87.333 Chronic venous hypertension (idiopathic) with ulcer and inflammation of bilateral lower extremity L97.828 Non-pressure chronic ulcer of other part of left lower leg with other specified severity L97.818 Non-pressure chronic ulcer of other part of right lower leg with other specified severity E11.621 Type 2 diabetes mellitus with foot ulcer L97.518 Non-pressure chronic ulcer of other part of right foot with other specified severity Facility Procedures CPT4 Code Description Modifier Quantity [id number] (Facility Use Only) [phone] - APPLY MULTLAY COMPRS LWR RT LEG 1 Electronic Signature(s) Signed: 02/16/2022 3:39:08 PM By: Linton Ham MD Signed: 02/16/2022 4:14:43 PM By: Erenest Blank Entered By: Erenest Blank on 02/16/2022 15:38:28

## 2022-02-16 NOTE — Progress Notes (Signed)
GABERIEL, YOUNGBLOOD ([id number]) [id number] Page 1 of 4 Visit Report for 02/16/2022 Arrival Information Details Patient Name: Date of Service: [name], [name] 02/16/2022 3:15 PM Medical Record Number: [id number] Patient Account Number: [id number] Date of Birth/Sex: Treating RN: 1955/04/23 (67 y.o. M) Primary Care Taquan Bralley: [name] Other Clinician: Referring Marquetta Weiskopf: Treating Nikea Settle/Extender: Estelle Grumbles in Treatment: 13 Visit Information History Since Last Visit Added or deleted any medications: No Patient Arrived: Gilford Rile Any new allergies or adverse reactions: No Arrival Time: 15:34 Had a fall or experienced change in No Accompanied By: friend activities of daily living that may affect Transfer Assistance: None risk of falls: Patient Identification Verified: Yes Signs or symptoms of abuse/neglect since last visito No Secondary Verification Process Completed: Yes Hospitalized since last visit: No Patient Requires Transmission-Based Precautions: No Implantable device outside of the clinic excluding No Patient Has Alerts: Yes cellular tissue based products placed in the center Patient Alerts: Patient on Blood Thinner since last visit: 01/2021 ABI L 1.13 R 1.06 Has Dressing in Place as Prescribed: Yes 01/2021 TBI L amp R 0.8 Has Compression in Place as Prescribed: Yes Pain Present Now: No Electronic Signature(s) Signed: 02/16/2022 4:14:43 PM By: Erenest Blank Entered By: Erenest Blank on 02/16/2022 15:35:31 -------------------------------------------------------------------------------- Compression Therapy Details Patient Name: Date of Service: [name] 02/16/2022 3:15 PM Medical Record Number: [id number] Patient Account Number: [id number] Date of Birth/Sex: Treating RN: Apr 07, 1955 (67 y.o. M) Primary Care Carneshia Raker: [name] Other Clinician: Referring Dirk Vanaman: Treating Holland Kotter/Extender: Estelle Grumbles in Treatment: 28 Compression Therapy Performed for Wound Assessment: Wound #7 Right,Medial Malleolus Performed By: Clinician Erenest Blank, Compression Type: Three Layer Electronic Signature(s) Signed: 02/16/2022 4:14:43 PM By: Erenest Blank Entered By: Erenest Blank on 02/16/2022 15:36:36 -------------------------------------------------------------------------------- Encounter Discharge Information Details Patient Name: Date of Service: Kanzler, DA [name] 02/16/2022 3:15 PM Medical Record Number: [id number] Patient Account Number: [id number] [name], [name] ([id number]) [phone].pdf Page 2 of 4 Date of Birth/Sex: Treating RN: 1955/09/16 (67 y.o. M) Primary Care Chayse Zatarain: Other Clinician: [name] Referring Rickesha Veracruz: Treating Oluwatamilore Starnes/Extender: Estelle Grumbles in Treatment: 28 Encounter Discharge Information Items Discharge Condition: Stable Ambulatory Status: Walker Discharge Destination: Home Transportation: Private Auto Accompanied By: friend Schedule Follow-up Appointment: Yes Clinical Summary of Care: Electronic Signature(s) Signed: 02/16/2022 4:14:43 PM By: Erenest Blank Entered By: Erenest Blank on 02/16/2022 15:37:38 -------------------------------------------------------------------------------- Patient/Caregiver Education Details Patient Name: Date of Service: [name] 2/7/2024andnbsp3:15 PM Medical Record Number: [id number] Patient Account Number: [id number] Date of Birth/Gender: Treating RN: 01/07/1956 (66 y.o. M) Primary Care Physician: [name] Other Clinician: Erenest Blank Referring Physician: Treating Physician/Extender: Estelle Grumbles in Treatment: 28 Education Assessment Education Provided To: Patient Education Topics Provided Electronic Signature(s) Signed: 02/16/2022 4:14:43 PM By: Erenest Blank Entered By: Erenest Blank on 02/16/2022  15:37:07 -------------------------------------------------------------------------------- Wound Assessment Details Patient Name: Date of Service: [name], [name] Huger [name]. 02/16/2022 3:15 PM Medical Record Number: [id number] Patient Account Number: [id number] Date of Birth/Sex: Treating RN: [birth year] (67 y.o. M) Primary Care Cornelia Walraven: [name] Other Clinician: Referring Yasmen Cortner: Treating Damione Robideau/Extender: Estelle Grumbles in Treatment: 28 Wound Status Wound Number: 7 Primary Venous Leg Ulcer Etiology: Wound Location: Right, Medial Malleolus Secondary Lymphedema Wounding Event: Blister Etiology: Date Acquired: 07/21/2021 Wound Open Weeks Of Treatment: 28 Status: Clustered Wound: No Comorbid Sleep Apnea, Arrhythmia, Congestive Heart Failure, History: Hypertension, Hypotension, Peripheral Arterial Disease, Peripheral Venous Disease, Type II Diabetes, Gout, Osteoarthritis [name], [name] ([id number]) [id number]  Page 3 of 4 Wound Measurements Length: (cm) 4.7 Width: (cm) 3.7 Depth: (cm) 0.2 Area: (cm) 13.658 Volume: (cm) 2.732 % Reduction in Area: 64.3% % Reduction in Volume: 89.8% Epithelialization: Small (1-33%) Wound Description Classification: Full Thickness With Exposed Suppo Wound Margin: Distinct, outline attached Exudate Amount: Medium Exudate Type: Serosanguineous Exudate Color: red, brown rt Structures Foul Odor After Cleansing: No Slough/Fibrino No Wound Bed Granulation Amount: Large (67-100%) Granulation Quality: Red Necrotic Amount: Small (1-33%) Necrotic Quality: Adherent Slough Periwound Skin Texture Texture Color No Abnormalities Noted: No No Abnormalities Noted: No Callus: No Atrophie Blanche: No Crepitus: No Cyanosis: No Excoriation: No Ecchymosis: No Induration: No Erythema: No Rash: No Hemosiderin Staining: No Scarring: No Mottled: No Pallor: No Moisture Rubor: No No Abnormalities Noted: No Dry  / Scaly: Yes Maceration: No Treatment Notes Wound #7 (Malleolus) Wound Laterality: Right, Medial Cleanser Peri-Wound Care Triamcinolone 15 (g) Discharge Instruction: Use triamcinolone 15 (g) as directed Sween Lotion (Moisturizing lotion) Discharge Instruction: Apply moisturizing lotion as directed Topical Primary Dressing Sorbalgon AG Dressing, 4x4 (in/in) Discharge Instruction: Apply to wound bed as instructed Secondary Dressing Woven Gauze Sponge, Non-Sterile 4x4 in Discharge Instruction: Apply over primary dressing as directed. Zetuvit Plus 4x8 in Discharge Instruction: Apply over primary dressing as directed. Secured With Compression Wrap ThreePress (3 layer compression wrap) Discharge Instruction: Apply three layer use kerlix instead of cotton compression as directed. Compression Stockings Add-Ons Electronic Signature(s) Signed: 02/16/2022 4:14:43 PM By: Erenest Blank Entered By: Erenest Blank on 02/16/2022 15:36:08 [name], [name] ([id number]) [phone].pdf Page 4 of 4 -------------------------------------------------------------------------------- Vitals Details Patient Name: Date of Service: [name], [name] 02/16/2022 3:15 PM Medical Record Number: [id number] Patient Account Number: [id number] Date of Birth/Sex: Treating RN: [birth year] (67 y.o. M) Primary Care Jody Silas: [name] Other Clinician: Referring Ahniyah Giancola: Treating Cash Meadow/Extender: Estelle Grumbles in Treatment: 28 Vital Signs Time Taken: 15:35 Reference Range: 80 - 120 mg / dl Height (in): 71 Weight (lbs): 350 Body Mass Index (BMI): 48.8 Electronic Signature(s) Signed: 02/16/2022 4:14:43 PM By: Erenest Blank Entered By: Erenest Blank on 02/16/2022 15:35:49

## 2022-02-17 ENCOUNTER — Other Ambulatory Visit: Payer: Self-pay | Admitting: *Deleted

## 2022-02-17 DIAGNOSIS — C8590 Non-Hodgkin lymphoma, unspecified, unspecified site: Secondary | ICD-10-CM

## 2022-02-21 ENCOUNTER — Inpatient Hospital Stay: Payer: Medicare Other

## 2022-02-21 ENCOUNTER — Inpatient Hospital Stay: Payer: Medicare Other | Admitting: Internal Medicine

## 2022-02-23 ENCOUNTER — Encounter (HOSPITAL_BASED_OUTPATIENT_CLINIC_OR_DEPARTMENT_OTHER): Payer: Medicare Other | Admitting: Physician Assistant

## 2022-02-23 DIAGNOSIS — Z89432 Acquired absence of left foot: Secondary | ICD-10-CM | POA: Diagnosis not present

## 2022-02-23 DIAGNOSIS — E11621 Type 2 diabetes mellitus with foot ulcer: Secondary | ICD-10-CM | POA: Diagnosis not present

## 2022-02-23 DIAGNOSIS — E1151 Type 2 diabetes mellitus with diabetic peripheral angiopathy without gangrene: Secondary | ICD-10-CM | POA: Diagnosis not present

## 2022-02-23 DIAGNOSIS — L97828 Non-pressure chronic ulcer of other part of left lower leg with other specified severity: Secondary | ICD-10-CM | POA: Diagnosis not present

## 2022-02-23 DIAGNOSIS — Z7901 Long term (current) use of anticoagulants: Secondary | ICD-10-CM | POA: Diagnosis not present

## 2022-02-23 DIAGNOSIS — Z8572 Personal history of non-Hodgkin lymphomas: Secondary | ICD-10-CM | POA: Diagnosis not present

## 2022-02-23 DIAGNOSIS — L97818 Non-pressure chronic ulcer of other part of right lower leg with other specified severity: Secondary | ICD-10-CM | POA: Diagnosis not present

## 2022-02-23 DIAGNOSIS — I872 Venous insufficiency (chronic) (peripheral): Secondary | ICD-10-CM | POA: Diagnosis not present

## 2022-02-23 DIAGNOSIS — L97312 Non-pressure chronic ulcer of right ankle with fat layer exposed: Secondary | ICD-10-CM | POA: Diagnosis not present

## 2022-02-23 DIAGNOSIS — I87333 Chronic venous hypertension (idiopathic) with ulcer and inflammation of bilateral lower extremity: Secondary | ICD-10-CM | POA: Diagnosis not present

## 2022-02-23 DIAGNOSIS — Z6841 Body Mass Index (BMI) 40.0 and over, adult: Secondary | ICD-10-CM | POA: Diagnosis not present

## 2022-02-23 DIAGNOSIS — I890 Lymphedema, not elsewhere classified: Secondary | ICD-10-CM | POA: Diagnosis not present

## 2022-02-23 DIAGNOSIS — I4891 Unspecified atrial fibrillation: Secondary | ICD-10-CM | POA: Diagnosis not present

## 2022-02-23 DIAGNOSIS — Z950 Presence of cardiac pacemaker: Secondary | ICD-10-CM | POA: Diagnosis not present

## 2022-02-23 DIAGNOSIS — L97518 Non-pressure chronic ulcer of other part of right foot with other specified severity: Secondary | ICD-10-CM | POA: Diagnosis not present

## 2022-02-24 NOTE — Progress Notes (Addendum)
[name] ([id number]) [id number] Page 1 of [name] Visit Report for 02/23/2022 Chief Complaint Document Details Patient Name: Date of Service: [name] Medical Record Number: [id number] Patient Account Number: [id number] Date of Birth/Sex: Treating RN: 10-12-1955 (67 y.o. M) Primary Care Provider: [name] Other Clinician: Referring Provider: Treating Provider/Extender: [name] in Treatment: 29 Information Obtained from: Patient Chief Complaint 08/04/2021; patient returns to clinic with bilateral leg wounds as well as areas on the right foot Electronic Signature(s) Signed: 02/23/2022 3:14:56 [name] By: [name] PA-C Entered By: [name] on 02/23/2022 15:14:56 -------------------------------------------------------------------------------- Cellular or Tissue Based Product Details Patient Name: Date of Service: [name], [name]. 02/23/2022 3:15 [name] Medical Record Number: [id number] Patient Account Number: [id number] Date of Birth/Sex: Treating RN: 25-May-1955 (67 y.o. [name] Primary Care Provider: [name] Other Clinician: Referring Provider: Treating Provider/Extender: [name] in Treatment: 29 Cellular or Tissue Based Product Type Wound #7 Right,Medial Malleolus Applied to: Performed By: Physician [name], PA Cellular or Tissue Based Product Type: Apligraf Level of Consciousness (Pre-procedure): Awake and Alert Pre-procedure Verification/Time Out Yes - 16:05 Taken: Location: genitalia / hands / feet / multiple digits Wound Size (sq cm): 8.6 Product Size (sq cm): 44 Waste Size (sq cm): 22 Waste Reason: wound size Amount of Product Applied (sq cm): 22 Instrument Used: Blade, Forceps, Scissors Lot #: [id number] Order #: 1 Expiration Date: 03/01/2022 Fenestrated: Yes Instrument: Blade Reconstituted: Yes Solution Type: normal  saline Solution Amount: 42m Lot #:[id number] Expiration Date: 06/09/2024 Secured: Yes Secured With: Steri-Strips, adaptic Dressing Applied: No Procedural Pain: 0 Post Procedural Pain: 0 Response to Treatment: Procedure was tolerated well [name] ([id number] Page 2 of [name] Level of Consciousness (Post- Awake and Alert procedure): Post Procedure Diagnosis Same as Pre-procedure Electronic Signature(s) Signed: 02/23/2022 5:47:19 [name] By: SWorthy KeelerPA-C Signed: 02/23/2022 5:48:24 [name] By: DDeon PillingRN, BSN Entered By: DDeon Pillingon 02/23/2022 16:12:57 -------------------------------------------------------------------------------- Debridement Details Patient Name: Date of Service: [name], [name] 02/23/2022 3:15 [name] Medical Record Number: [id number] Account Number: [id number] of Birth/Sex: Treating RN: [birth year] [name], 1957(67y.o. [name] Ang BMeta.RedingPrimary Care Provider: SMarton RedwoodOther Clinician: Referring Provider: Treating Provider/Extender: SJarome Matinin Treatment: 29 Debridement Performed for Assessment: Wound #7 Right,Medial Malleolus Performed By: Physician SWorthy Keeler PA Debridement Type: Debridement Severity of Tissue Pre Debridement: Fat layer exposed Level of Consciousness (Pre-procedure): Awake and Alert Pre-procedure Verification/Time Out Yes - 15:55 Taken: Start Time: 15:56 Pain Control: Lidocaine 4% T opical Solution T Area Debrided (L x W): otal 4.3 (cm) x 2 (cm) = 8.6 (cm) Tissue and other material debrided: Viable, Non-Viable, Slough, Subcutaneous, Skin: Dermis , Skin: Epidermis, Slough Level: Skin/Subcutaneous Tissue Debridement Description: Excisional Instrument: Curette Bleeding: Minimum Hemostasis Achieved: Pressure End Time: 16:04 Procedural Pain: 0 Post Procedural Pain: 0 Response to Treatment: Procedure was tolerated well Level of Consciousness (Post-  Awake and Alert procedure): Post Debridement Measurements of Total Wound Length: (cm) 4.3 Width: (cm) 2 Depth: (cm) 0.2 Volume: (cm) 1.351 Character of Wound/Ulcer Post Debridement: Improved Severity of Tissue Post Debridement: Fat layer exposed Post Procedure Diagnosis Same as Pre-procedure Electronic Signature(s) Signed: 02/23/2022 5:47:19 [name] By: SWorthy KeelerPA-C Signed: 02/23/2022 5:48:24 [name] By: DDeon PillingRN, BSN Entered By: DDeon Pillingon 02/23/2022 16:09:40 Biondolillo, [name] -------------------------------------------------------------------------------- HPI Details Patient  Name: Date of Service: [name] Medical Record Number: [id number] Patient Account Number: [id number] Date of Birth/Sex: Treating RN: 12-Nov-1955 (67 y.o. M) Primary Care Provider: [name] Other Clinician: Referring Provider: Treating Provider/Extender: [name] in Treatment: 29 History of Present Illness HPI Description: ADMISSION 03/22/2021 This is a 67 year old man with a past medical history significant for diabetes type 2, congestive heart failure, peripheral arterial disease, morbid obesity, venous insufficiency, and coronary artery disease. He has been followed by [name] in podiatry, who performed a transmetatarsal amputation on the left foot in August 2022. He had issues healing that wound, but based upon Dr. [name] notes, ultimately the TMA wound healed. During his recovery from that surgery, however, ulcers opened up over the DIP joint of the right second and third toe. These have apparently closed and reopened multiple times. It sounds like one of the issues has been moisture accumulation and maceration of the tissues causing them to reopen. At his last visit with [name], on March 01, 2021, there continues to be problems with moisture and he was referred to  wound care for further evaluation and management. He had a formal aortogram with runoff performed prior to his TMA. The findings are copied here: Patient has inline flow to both feet with no significant flow-limiting lesion that would be amenable to percutaneous or open revascularization. He does have an element of small vessel disease and has a short segment occlusion of the distal anterior tibial/dorsalis pedis artery on the left foot but does have posterior tibial artery flow. Would recommend management of wounds with amputation of toes 2 and 3 on the right foot if the wounds do not heal and deteriorate. Transmetatarsal amputation on the left side has as good a blood supply as it is going to get and hopefully this will heal in the future. Formal ABIs were done in January 2023. They are normal bilaterally. ABI Findings: +---------+------------------+-----+----------+--------+ Right Rt Pressure (mmHg)IndexWaveform Comment  +---------+------------------+-----+----------+--------+ Brachial 160     +---------+------------------+-----+----------+--------+ PTA 192 1.06 monophasic  +---------+------------------+-----+----------+--------+ DP 159 0.88 monophasic  +---------+------------------+-----+----------+--------+ Great T oe145 0.80    +---------+------------------+-----+----------+--------+ +--------+------------------+-----+---------+-------+ Left Lt Pressure (mmHg)IndexWaveform Comment +--------+------------------+-----+---------+-------+ [id number]     +--------+------------------+-----+---------+-------+ PTA 204 1.13 triphasic  +--------+------------------+-----+---------+-------+ DP 194 1.07 biphasic   +--------+------------------+-----+---------+-------+ +-------+-----------+-----------+------------+------------+ ABI/TBIT oday's ABIT oday's TBIPrevious ABIPrevious  TBI +-------+-----------+-----------+------------+------------+ Right 1.06 0.8 1.26 0.65  +-------+-----------+-----------+------------+------------+ Left 1.13 amputation 1.15 amputation  +-------+-----------+-----------+------------+------------+ Previous ABI on 08/06/20 at [facility] Pedal pressures falsely elevated due to medial calcification. Summary: Right: Resting right ankle-brachial index is within normal range. The right toe-brachial index is normal. Left: Resting left ankle-brachial index is within normal range. READMISSION 08/04/2021 Mr. Weisenbach is now a 67 year old man who I remember from this clinic many years ago I think he had a right lower extremity predominantly venous wound at the time. He was here for 1 visit in March of this year had wounds on his right second and third toes we apparently dressed them many and they healed so he did not come back. He is listed in [place] is being a diabetic although the patient denies this says he is verified it with his primary doctor. In any case over the last several weeks or so according the patient although these wounds look somewhat more chronic than that he has developed predominantly large wounds on the right medial and right lateral ankle smaller areas on the left leg and areas on the dorsal aspect  of his right second and third toes. Its not clear how he has been dressing these. 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 The patient has known PAD. He had an angiogram in August 2022 at which time he had nonhealing wounds in both feet. On the left his major vessels in the thigh were all patent. He had three-vessel patent to the level of the ankle. He had a very short occlusion in the left anterior tibial. On the right lower extremity the major vessels in his thigh were all patent. He had three-vessel runoff to the foot sluggish filling of the  anterior tibial artery. He was felt to have some component of small vessel disease but nothing that was amenable or needed revascularization. It was recommended that he have amputation of the second and third toes on the right foot if they did not heal VAHIN, DISCHER [name] ([id number]) [id number] Page 4 of [name] He has been following with [name] of podiatry. [name] got him juxta lite stockings although I do not think he had them on properly he has uncontrolled edema in both legs Past medical history includes type 2 diabetes [although the patient really denies this], left TMA in 2022,lower extremity wounds in fact attendance at this clinic in 2009-2010, A-fib on Eliquis, chronic venous insufficiency with secondary lymphedema history of non-Hodgkin's lymphoma. 08-11-2021 upon evaluation today patient presents for follow-up evaluation he was seen last Wednesday initially for inspection here in our clinic. With that being said he tells me that he unfortunately has been having a lot of drainage and is actually coming through his wrap. Fortunately I do not see any evidence of active infection locally or systemically at this time which is great news. No fevers, chills, nausea, vomiting, or diarrhea. With that being said there does appear to be some evidence of local infection based on what I am seeing today. 08-18-2021 upon evaluation today patient appears to be doing okay currently in regard to his wounds with that being said that he is doing much better but still has a long ways to go to get to where he wanted to be. I think the infection is significantly improved. He has another week of the antibiotic at this point. 08-25-2021 upon evaluation today patient's wounds are actually doing decently well he has erythema has significantly improved. I think the cellulitis is under controlled I am going to continue him on 2 more weeks of the Levaquin at 500 mg this is a lower dose but I am  hoping it will be better for him. 09-08-2021 upon evaluation today patient appears to be doing excellent in regard to his wounds. Since I last saw him he was actually in the hospital where he ended up having a pacemaker put in. Subsequently he tells me that he is actually doing quite well although they were unsure whether they were going to do it due to the fact that he had the wounds on his legs. And then I am glad they did anything seem to be doing well. 09-15-2021 upon evaluation patient's wounds are actually showing signs of improvement. The right side wounds do appear to have some need for sharp debridement today and I Georgina Peer go ahead and proceed with that. I think that if we get the wounds cleaned up he will actually show signs of continued improvement. I am also leaning towards switching to [facility] which I think will be  a much better option for him. 09-22-2021 patient's wounds are showing signs of excellent improvement. I am actually extremely pleased with where we stand and I think that the patient is making great progress. There does not appear to be any signs of active infection. 09-29-2021 upon evaluation today patient appears to be doing excellent in regard to his wounds. He is actually tolerating the dressing changes without complication. Fortunately I see no evidence of active infection locally or systemically at this time which is great news and overall I am extremely pleased with where we stand currently. 09-2721 upon evaluation today patient actually appears to be doing excellent in regard to his wounds. The left leg is almost completely healed the right ankle is significantly smaller. Overall I am extremely pleased with where we stand at this point. I do not see any evidence of active infection at this time. 10-13-2021 upon evaluation today patient appears to be doing excellent in regard to his wounds. I really feel like he is making good progress here and I am very pleased in that  regard. Fortunately I do not see any signs of active infection at this time. We are using the [facility] topical antibiotic therapy. 10-[name]-2023 upon evaluation today patient appears to be doing well with regard to his wound on the right medial ankle region the left leg is almost completely healed. I am actually very pleased with where we stand today. 11-03-2021 upon evaluation today patient's wound actually is going require some sharp debridement but appears to be doing much better which is great news. Fortunately I do not see any signs of active infection at this time. [name]-01-2021 upon evaluation today patient's wound is actually showing signs of excellent improvement. Fortunately I do not see any evidence of infection locally or systemically which is great news and overall I am extremely pleased with where we stand today. I do believe he is making good progress he does have his [facility] topical antibiotics with him here today. [name]-15-2023 upon evaluation today patient actually showing signs of excellent improvement this appears to be doing much better. Fortunately I do not see any evidence of infection locally or systemically at this time. [name]-22-2023 upon evaluation today patient appears to be doing well currently in regard to his wound. He has been tolerating the dressing changes without complication. Fortunately I do not see any evidence of active infection at this time which is great news and overall I am extremely pleased with where we stand today. 12-15-2021 upon evaluation today patient appears to be doing well with regard to his wound. He is showing signs of improvement is slow but nonetheless we are making improvements here. Fortunately I do not see any evidence of active infection locally nor systemically at this time. We are still using the [facility] topical antibiotic over the open area only. 12-29-2021 upon evaluation today patient's wound actually is showing signs of excellent improvement. It  has been 2 weeks since I perform any debridement and it definitely shows he has some tissue that needs to be cleaned away but I think we can do so quite easily and readily today. The good news is I do not see any signs of infection I think he is doing much better in that regard. Overall I am extremely pleased with where we stand. 01-12-2022 upon evaluation today patient appears to be doing well currently in regard to his wound although it is not getting significantly smaller it is also not getting any larger. Fortunately I do not  see any evidence of infection locally nor systemically which is great news and overall I am extremely pleased with where things stand currently. 01-19-2022 upon evaluation today patient appears to be doing well currently in regard to his wound. The PolyMem actually seems to have done extremely well for him. Fortunately I do not see any signs of infection locally nor systemically at this time. 01-26-2022 upon evaluation today patient appears to be making progress. Fortunately there does not appear to be any signs of infection which is great news. No fevers, chills, nausea, vomiting, or diarrhea. With that being said this is very slow to heal and although it is smaller I still feel like we may want to try to do something to speed this up I think that a skin substitute could be beneficial, look into Kerecis. 02-02-2022 upon evaluation today patient's wound is actually showing signs of excellent improvement. I do not see any evidence of infection and overall I think that we are headed in the right direction. Fortunately I think that he is tolerating the dressing changes without complication. 02-09-2022 upon evaluation today patient appears to be doing well currently in regard to his wound. It does look a little bit macerated but fortunately does not appear to be showing any signs of significant skin breakdown over the macerated area which is good news. Fortunately I do not see any  evidence of active infection locally nor systemically at this point which is great news. 02-23-2022 upon evaluation today patient appears to be doing well currently in regard to his wounds. In fact his area on the ankle is actually showing signs of healing quite nicely. We do have the Apligraf ready today I am hopeful this is going to speed things up and get this closed much more effectively and quickly. Fortunately I do not see any signs of active infection locally nor systemically at this time which is great news. Electronic Signature(s) Signed: 02/23/2022 5:32:36 [name] By: [name] PA-C Entered By: [name] on 02/23/2022 17:32:36 [name], [name] Knudsen [name] ([id number]) [phone].pdf Page 5 of [name] -------------------------------------------------------------------------------- Physical Exam Details Patient Name: Date of Service: [name] Medical Record Number: [id number] Patient Account Number: [id number] Date of Birth/Sex: Treating RN: [birth year] (67 y.o. M) Primary Care Provider: [name] Other Clinician: Referring Provider: Treating Provider/Extender: [name] in Treatment: 29 Constitutional Well-nourished and well-hydrated in no acute distress. Respiratory normal breathing without difficulty. Psychiatric this patient is able to make decisions and demonstrates good insight into disease process. Alert and Oriented x 3. pleasant and cooperative. Notes Upon inspection patient's wound bed actually did require sharp debridement clearway some of the necrotic debris he tolerated that today without complication and postdebridement wound bed appears to be doing much better which is great news. I am good to go ahead and reapply the Apligraf today which I think would be beneficial. And this was applied in the clinic today. Electronic Signature(s) Signed: 02/23/2022 5:32:55 [name] By: [name] PA-C Entered By:  [name] on 02/23/2022 17:32:55 -------------------------------------------------------------------------------- Physician Orders Details Patient Name: Date of Service: [name], [name] 02/23/2022 3:15 [name] Medical Record Number: [id number] Patient Account Number: [id number] Date of Birth/Sex: Treating RN: [name]/22/1957 (67 y.o. [name] Primary Care Provider: [name] Other Clinician: Referring Provider: Treating Provider/Extender: [name] in Treatment: 29 Verbal / Phone Orders: No Diagnosis Coding ICD-10 Coding Code Description I89.0 Lymphedema, not elsewhere classified I87.333 Chronic  venous hypertension (idiopathic) with ulcer and inflammation of bilateral lower extremity L97.828 Non-pressure chronic ulcer of other part of left lower leg with other specified severity L97.818 Non-pressure chronic ulcer of other part of right lower leg with other specified severity E11.621 Type 2 diabetes mellitus with foot ulcer L97.518 Non-pressure chronic ulcer of other part of right foot with other specified severity Follow-up Appointments ppointment in 1 week. Margarita Grizzle on Wednesday 02/24/32 @ 3:15 Rm # 9 w/ lauran Return A ppointment in 2 weeks. Margarita Grizzle 03/02/2022 3 [name] Return A Return appointment in 3 weeks. Margarita Grizzle 03/09/2022 1230pm Nurse Visit: - 03/16/2022 3 [name] Anesthetic (In clinic) Topical Lidocaine 5% applied to wound bed Cellular or Tissue Based Products [name] ([id number]) [id number] Page 6 of [name] Cellular or Tissue Based Product Type: - Run IVR for Kerrecis- denied #1 Apligraf applied 02/23/2022 Cellular or Tissue Based Product applied to wound bed, secured with steri-strips, cover with Adaptic or Mepitel. (DO NOT REMOVE). Bathing/ Shower/ Hygiene May shower with protection but do not get wound dressing(s) wet. Protect dressing(s) with water repellant cover (for example, large plastic bag) or a cast cover and may  then take shower. Edema Control - Lymphedema / SCD / Other Elevate legs to the level of the heart or above for 30 minutes daily and/or when sitting for 3-4 times a day throughout the day. Avoid standing for long periods of time. Exercise regularly Moisturize legs daily. - apply every night before bed to left leg. Compression stocking or Garment 30-40 mm/Hg pressure to: - wear your VIVE compression stocking to left leg. apply in the morning and remove at night. Will apply tubigrip size [name] to left leg. Remove once you get home and use your compression stockings. Wound Treatment Wound #7 - Malleolus Wound Laterality: Right, Medial Peri-Wound Care: Triamcinolone 15 (g) 1 x Per Week/30 Days Discharge Instructions: Use triamcinolone 15 (g) as directed Peri-Wound Care: Sween Lotion (Moisturizing lotion) 1 x Per Week/30 Days Discharge Instructions: Apply moisturizing lotion as directed Prim Dressing: apligraf #1 1 x Per Week/30 Days ary Discharge Instructions: applied by provider. Prim Dressing: adaptic and steri-strips 1 x Per Week/30 Days ary Discharge Instructions: secured the apligraf. Secondary Dressing: Woven Gauze Sponge, Non-Sterile 4x4 in 1 x Per Week/30 Days Discharge Instructions: Apply over primary dressing as directed. Secondary Dressing: Zetuvit Plus 4x8 in 1 x Per Week/30 Days Discharge Instructions: Apply over primary dressing as directed. Compression Wrap: ThreePress (3 layer compression wrap) 1 x Per Week/30 Days Discharge Instructions: Apply three layer use kerlix instead of cotton compression as directed. Electronic Signature(s) Signed: 02/23/2022 5:47:19 [name] By: [name] PA-C Signed: 02/23/2022 5:48:24 [name] By: Deon Pilling RN, BSN Entered By: Deon Pilling on 02/23/2022 16:18:33 -------------------------------------------------------------------------------- Problem List Details Patient Name: Date of Service: [name], [name] 02/23/2022 3:15 [name] Medical Record Number:  [id number] Patient Account Number: [id number] Date of Birth/Sex: Treating RN: 1955/06/11 (67 y.o. M) Primary Care Provider: [name] Other Clinician: Referring Provider: Treating Provider/Extender: [name] in Treatment: 29 Active Problems ICD-10 Encounter Code Description Active Date MDM Diagnosis I89.0 Lymphedema, not elsewhere classified 08/04/2021 No Yes I87.333 Chronic venous hypertension (idiopathic) with ulcer and inflammation of 08/04/2021 No Yes bilateral lower extremity [name], [name] ([id number]) [phone].pdf Page 7 of [name] [phone] Non-pressure chronic ulcer of other part of left lower leg with other specified 08/04/2021 No Yes severity L97.818 Non-pressure chronic ulcer of other part of right lower leg with other specified 08/04/2021  No Yes severity E11.621 Type 2 diabetes mellitus with foot ulcer 08/04/2021 No Yes L97.518 Non-pressure chronic ulcer of other part of right foot with other specified 08/04/2021 No Yes severity Inactive Problems Resolved Problems Electronic Signature(s) Signed: 02/23/2022 3:14:45 [name] By: [name] PA-C Entered By: [name] on 02/23/2022 15:14:44 -------------------------------------------------------------------------------- Progress Note Details Patient Name: Date of Service: [name], [name] 02/23/2022 3:15 [name] Medical Record Number: [id number] Patient Account Number: [id number] Date of Birth/Sex: Treating RN: [birth year] (67 y.o. M) Primary Care Provider: [name] Other Clinician: Referring Provider: Treating Provider/Extender: [name] in Treatment: 29 Subjective Chief Complaint Information obtained from Patient 08/04/2021; patient returns to clinic with bilateral leg wounds as well as areas on the right foot History of Present Illness (HPI) ADMISSION 03/22/2021 This is a 67 year old man with a past medical history significant for diabetes  type 2, congestive heart failure, peripheral arterial disease, morbid obesity, venous insufficiency, and coronary artery disease. He has been followed by [name] in podiatry, who performed a transmetatarsal amputation on the left foot in August 2022. He had issues healing that wound, but based upon Dr. [name] notes, ultimately the TMA wound healed. During his recovery from that surgery, however, ulcers opened up over the DIP joint of the right second and third toe. These have apparently closed and reopened multiple times. It sounds like one of the issues has been moisture accumulation and maceration of the tissues causing them to reopen. At his last visit with [name], on March 01, 2021, there continues to be problems with moisture and he was referred to wound care for further evaluation and management. He had a formal aortogram with runoff performed prior to his TMA. The findings are copied here: Patient has inline flow to both feet with no significant flow-limiting lesion that would be amenable to percutaneous or open revascularization. He does have an element of small vessel disease and has a short segment occlusion of the distal anterior tibial/dorsalis pedis artery on the left foot but does have posterior tibial artery flow. Would recommend management of wounds with amputation of toes 2 and 3 on the right foot if the wounds do not heal and deteriorate. Transmetatarsal amputation on the left side has as good a blood supply as it is going to get and hopefully this will heal in the future. Formal ABIs were done in January 2023. They are normal bilaterally. ABI Findings: +---------+------------------+-----+----------+--------+ Right Rt Pressure (mmHg)IndexWaveform Comment  +---------+------------------+-----+----------+--------+ Brachial 160    +---------+------------------+-----+----------+--------+ PTA 192 1.06 monophasic   +---------+------------------+-----+----------+--------+ DP 159 0.88 monophasic  +---------+------------------+-----+----------+--------+ Frazee T [id number] 0.80    [name], [name] ([id number]) [phone].pdf Page 8 of [name] +---------+------------------+-----+----------+--------+ +--------+------------------+-----+---------+-------+ Left Lt Pressure (mmHg)IndexWaveform Comment +--------+------------------+-----+---------+-------+ [id number]    +--------+------------------+-----+---------+-------+ PTA 204 1.13 triphasic  +--------+------------------+-----+---------+-------+ DP 194 1.07 biphasic   +--------+------------------+-----+---------+-------+ +-------+-----------+-----------+------------+------------+ ABI/TBIT oday's ABIT oday's TBIPrevious ABIPrevious TBI +-------+-----------+-----------+------------+------------+ Right 1.06 0.8 1.26 0.65  +-------+-----------+-----------+------------+------------+ Left 1.13 amputation 1.15 amputation  +-------+-----------+-----------+------------+------------+ Previous ABI on 08/06/20 at [facility] Pedal pressures falsely elevated due to medial calcification. Summary: Right: Resting right ankle-brachial index is within normal range. The right toe-brachial index is normal. Left: Resting left ankle-brachial index is within normal range. READMISSION 08/04/2021 Mr. Seaberg is now a 67 year old man who I remember from this clinic many years ago I think he had a right lower extremity predominantly venous wound at the time. He was here for 1 visit in March of this year had wounds on his right second  and third toes we apparently dressed them many and they healed so he did not come back. He is listed in [place] is being a diabetic although the patient denies this says he is verified it with his primary doctor. In any case over the last several weeks or so according the patient  although these wounds look somewhat more chronic than that he has developed predominantly large wounds on the right medial and right lateral ankle smaller areas on the left leg and areas on the dorsal aspect of his right second and third toes. Its not clear how he has been dressing these. 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 The patient has known PAD. He had an angiogram in August 2022 at which time he had nonhealing wounds in both feet. On the left his major vessels in the thigh were all patent. He had three-vessel patent to the level of the ankle. He had a very short occlusion in the left anterior tibial. On the right lower extremity the major vessels in his thigh were all patent. He had three-vessel runoff to the foot sluggish filling of the anterior tibial artery. He was felt to have some component of small vessel disease but nothing that was amenable or needed revascularization. It was recommended that he have amputation of the second and third toes on the right foot if they did not heal He has been following with [name] of podiatry. [name] got him juxta lite stockings although I do not think he had them on properly he has uncontrolled edema in both legs Past medical history includes type 2 diabetes [although the patient really denies this], left TMA in 2022,lower extremity wounds in fact attendance at this clinic in 2009-2010, A-fib on Eliquis, chronic venous insufficiency with secondary lymphedema history of non-Hodgkin's lymphoma. 08-11-2021 upon evaluation today patient presents for follow-up evaluation he was seen last Wednesday initially for inspection here in our clinic. With that being said he tells me that he unfortunately has been having a lot of drainage and is actually coming through his wrap. Fortunately I do not see any evidence of active infection locally or systemically at this time which is  great news. No fevers, chills, nausea, vomiting, or diarrhea. With that being said there does appear to be some evidence of local infection based on what I am seeing today. 08-18-2021 upon evaluation today patient appears to be doing okay currently in regard to his wounds with that being said that he is doing much better but still has a long ways to go to get to where he wanted to be. I think the infection is significantly improved. He has another week of the antibiotic at this point. 08-25-2021 upon evaluation today patient's wounds are actually doing decently well he has erythema has significantly improved. I think the cellulitis is under controlled I am going to continue him on 2 more weeks of the Levaquin at 500 mg this is a lower dose but I am hoping it will be better for him. 09-08-2021 upon evaluation today patient appears to be doing excellent in regard to his wounds. Since I last saw him he was actually in the hospital where he ended up having a pacemaker put in. Subsequently he tells me that he is actually doing quite well although they were unsure whether they were going to do it due to the fact that he had the wounds on his  legs. And then I am glad they did anything seem to be doing well. 09-15-2021 upon evaluation patient's wounds are actually showing signs of improvement. The right side wounds do appear to have some need for sharp debridement today and I Georgina Peer go ahead and proceed with that. I think that if we get the wounds cleaned up he will actually show signs of continued improvement. I am also leaning towards switching to [facility] which I think will be a much better option for him. 09-22-2021 patient's wounds are showing signs of excellent improvement. I am actually extremely pleased with where we stand and I think that the patient is making great progress. There does not appear to be any signs of active infection. 09-29-2021 upon evaluation today patient appears to be doing excellent  in regard to his wounds. He is actually tolerating the dressing changes without complication. Fortunately I see no evidence of active infection locally or systemically at this time which is great news and overall I am extremely pleased with where we stand currently. 09-2721 upon evaluation today patient actually appears to be doing excellent in regard to his wounds. The left leg is almost completely healed the right ankle is significantly smaller. Overall I am extremely pleased with where we stand at this point. I do not see any evidence of active infection at this time. 10-13-2021 upon evaluation today patient appears to be doing excellent in regard to his wounds. I really feel like he is making good progress here and I am very pleased in that regard. Fortunately I do not see any signs of active infection at this time. We are using the [facility] topical antibiotic therapy. 10-[name]-2023 upon evaluation today patient appears to be doing well with regard to his wound on the right medial ankle region the left leg is almost completely healed. I am actually very pleased with where we stand today. 11-03-2021 upon evaluation today patient's wound actually is going require some sharp debridement but appears to be doing much better which is great news. MALEEK, BOUGHTON ([id number]) [id number] Page 9 of [name] Fortunately I do not see any signs of active infection at this time. [name]-01-2021 upon evaluation today patient's wound is actually showing signs of excellent improvement. Fortunately I do not see any evidence of infection locally or systemically which is great news and overall I am extremely pleased with where we stand today. I do believe he is making good progress he does have his [facility] topical antibiotics with him here today. [name]-15-2023 upon evaluation today patient actually showing signs of excellent improvement this appears to be doing much better. Fortunately I do not see  any evidence of infection locally or systemically at this time. [name]-22-2023 upon evaluation today patient appears to be doing well currently in regard to his wound. He has been tolerating the dressing changes without complication. Fortunately I do not see any evidence of active infection at this time which is great news and overall I am extremely pleased with where we stand today. 12-15-2021 upon evaluation today patient appears to be doing well with regard to his wound. He is showing signs of improvement is slow but nonetheless we are making improvements here. Fortunately I do not see any evidence of active infection locally nor systemically at this time. We are still using the [facility] topical antibiotic over the open area only. 12-29-2021 upon evaluation today patient's wound actually is showing signs of excellent improvement. It has been 2 weeks since I perform any debridement and it  definitely shows he has some tissue that needs to be cleaned away but I think we can do so quite easily and readily today. The good news is I do not see any signs of infection I think he is doing much better in that regard. Overall I am extremely pleased with where we stand. 01-12-2022 upon evaluation today patient appears to be doing well currently in regard to his wound although it is not getting significantly smaller it is also not getting any larger. Fortunately I do not see any evidence of infection locally nor systemically which is great news and overall I am extremely pleased with where things stand currently. 01-19-2022 upon evaluation today patient appears to be doing well currently in regard to his wound. The PolyMem actually seems to have done extremely well for him. Fortunately I do not see any signs of infection locally nor systemically at this time. 01-26-2022 upon evaluation today patient appears to be making progress. Fortunately there does not appear to be any signs of infection which is great news. No  fevers, chills, nausea, vomiting, or diarrhea. With that being said this is very slow to heal and although it is smaller I still feel like we may want to try to do something to speed this up I think that a skin substitute could be beneficial, look into Kerecis. 02-02-2022 upon evaluation today patient's wound is actually showing signs of excellent improvement. I do not see any evidence of infection and overall I think that we are headed in the right direction. Fortunately I think that he is tolerating the dressing changes without complication. 02-09-2022 upon evaluation today patient appears to be doing well currently in regard to his wound. It does look a little bit macerated but fortunately does not appear to be showing any signs of significant skin breakdown over the macerated area which is good news. Fortunately I do not see any evidence of active infection locally nor systemically at this point which is great news. 02-23-2022 upon evaluation today patient appears to be doing well currently in regard to his wounds. In fact his area on the ankle is actually showing signs of healing quite nicely. We do have the Apligraf ready today I am hopeful this is going to speed things up and get this closed much more effectively and quickly. Fortunately I do not see any signs of active infection locally nor systemically at this time which is great news. Objective Constitutional Well-nourished and well-hydrated in no acute distress. Vitals Time Taken: 3:40 [name], Height: 71 in, Weight: 350 lbs, BMI: 48.8, Temperature: 98.2 F, Pulse: 82 bpm, Respiratory Rate: 18 breaths/min, Blood Pressure: 139/86 mmHg. Respiratory normal breathing without difficulty. Psychiatric this patient is able to make decisions and demonstrates good insight into disease process. Alert and Oriented x 3. pleasant and cooperative. General Notes: Upon inspection patient's wound bed actually did require sharp debridement clearway some of the  necrotic debris he tolerated that today without complication and postdebridement wound bed appears to be doing much better which is great news. I am good to go ahead and reapply the Apligraf today which I think would be beneficial. And this was applied in the clinic today. Integumentary (Hair, Skin) Wound #7 status is Open. Original cause of wound was Blister. The date acquired was: 07/21/2021. The wound has been in treatment 29 weeks. The wound is located on the Right,Medial Malleolus. The wound measures 4.3cm length x 2cm width x 0.2cm depth; 6.754cm^2 area and 1.351cm^3 volume. There is no tunneling  or undermining noted. There is a medium amount of serosanguineous drainage noted. The wound margin is distinct with the outline attached to the wound base. There is large (67-100%) red granulation within the wound bed. There is a small (1-33%) amount of necrotic tissue within the wound bed including Adherent Slough. The periwound skin appearance exhibited: Dry/Scaly. The periwound skin appearance did not exhibit: Callus, Crepitus, Excoriation, Induration, Rash, Scarring, Maceration, Atrophie Blanche, Cyanosis, Ecchymosis, Hemosiderin Staining, Mottled, Pallor, Rubor, Erythema. Assessment Active Problems ICD-10 Lymphedema, not elsewhere classified Chronic venous hypertension (idiopathic) with ulcer and inflammation of bilateral lower extremity Non-pressure chronic ulcer of other part of left lower leg with other specified severity KAMAI, NOWICKI ([id number]) [phone].pdf Page 10 of [name] Non-pressure chronic ulcer of other part of right lower leg with other specified severity Type 2 diabetes mellitus with foot ulcer Non-pressure chronic ulcer of other part of right foot with other specified severity Procedures Wound #7 Pre-procedure diagnosis of Wound #7 is a Venous Leg Ulcer located on the Right,Medial Malleolus .Severity of Tissue Pre Debridement is: Fat layer  exposed. There was a Excisional Skin/Subcutaneous Tissue Debridement with a total area of 8.6 sq cm performed by [name], PA. With the following instrument(s): Curette to remove Viable and Non-Viable tissue/material. Material removed includes Subcutaneous Tissue, Slough, Skin: Dermis, and Skin: Epidermis after achieving pain control using Lidocaine 4% Topical Solution. A time out was conducted at 15:55, prior to the start of the procedure. A Minimum amount of bleeding was controlled with Pressure. The procedure was tolerated well with a pain level of 0 throughout and a pain level of 0 following the procedure. Post Debridement Measurements: 4.3cm length x 2cm width x 0.2cm depth; 1.351cm^3 volume. Character of Wound/Ulcer Post Debridement is improved. Severity of Tissue Post Debridement is: Fat layer exposed. Post procedure Diagnosis Wound #7: Same as Pre-Procedure Pre-procedure diagnosis of Wound #7 is a Venous Leg Ulcer located on the Right,Medial Malleolus. A skin graft procedure using a bioengineered skin substitute/cellular or tissue based product was performed by [name], PA with the following instrument(s): Blade, Forceps, and Scissors. Apligraf was applied and secured with Steri-Strips and adaptic. 22 sq cm of product was utilized and 22 sq cm was wasted due to wound size. Post Application, no dressing was applied. A Time Out was conducted at 16:05, prior to the start of the procedure. The procedure was tolerated well with a pain level of 0 throughout and a pain level of 0 following the procedure. Post procedure Diagnosis Wound #7: Same as Pre-Procedure . Pre-procedure diagnosis of Wound #7 is a Venous Leg Ulcer located on the Right,Medial Malleolus . There was a Three Layer Compression Therapy Procedure by Deon Pilling, RN. Post procedure Diagnosis Wound #7: Same as Pre-Procedure Plan Follow-up Appointments: Return Appointment in 1 week. Margarita Grizzle on Wednesday 02/24/32 @  3:15 Rm # 9 w/ lauran Return Appointment in 2 weeks. Margarita Grizzle 03/02/2022 3 [name] Return appointment in 3 weeks. Margarita Grizzle 03/09/2022 1230pm Nurse Visit: - 03/16/2022 3 [name] Anesthetic: (In clinic) Topical Lidocaine 5% applied to wound bed Cellular or Tissue Based Products: Cellular or Tissue Based Product Type: - Run IVR for Kerrecis- denied #1 Apligraf applied 02/23/2022 Cellular or Tissue Based Product applied to wound bed, secured with steri-strips, cover with Adaptic or Mepitel. (DO NOT REMOVE). Bathing/ Shower/ Hygiene: May shower with protection but do not get wound dressing(s) wet. Protect dressing(s) with water repellant cover (for example, large plastic bag) or a cast cover  and may then take shower. Edema Control - Lymphedema / SCD / Other: Elevate legs to the level of the heart or above for 30 minutes daily and/or when sitting for 3-4 times a day throughout the day. Avoid standing for long periods of time. Exercise regularly Moisturize legs daily. - apply every night before bed to left leg. Compression stocking or Garment 30-40 mm/Hg pressure to: - wear your VIVE compression stocking to left leg. apply in the morning and remove at night. Will apply tubigrip size [name] to left leg. Remove once you get home and use your compression stockings. WOUND #7: - Malleolus Wound Laterality: Right, Medial Peri-Wound Care: Triamcinolone 15 (g) 1 x Per Week/30 Days Discharge Instructions: Use triamcinolone 15 (g) as directed Peri-Wound Care: Sween Lotion (Moisturizing lotion) 1 x Per Week/30 Days Discharge Instructions: Apply moisturizing lotion as directed Prim Dressing: apligraf #1 1 x Per Week/30 Days ary Discharge Instructions: applied by provider. Prim Dressing: adaptic and steri-strips 1 x Per Week/30 Days ary Discharge Instructions: secured the apligraf. Secondary Dressing: Woven Gauze Sponge, Non-Sterile 4x4 in 1 x Per Week/30 Days Discharge Instructions: Apply over primary dressing as  directed. Secondary Dressing: Zetuvit Plus 4x8 in 1 x Per Week/30 Days Discharge Instructions: Apply over primary dressing as directed. Com pression Wrap: ThreePress (3 layer compression wrap) 1 x Per Week/30 Days Discharge Instructions: Apply three layer use kerlix instead of cotton compression as directed. 1. Based on what I am seeing I would go ahead and recommend that we have the patient continue to monitor for any signs of infection or worsening. Based on what I see I do believe that the patient is making really good progress here. I am hopeful that he will continue to show signs of improvement and thinking the Apligraf is going to help significantly with this. 2. I am also can recommend that we have the patient continue with the compression wrapping which I feel like is doing a good job here. We will see patient back for reevaluation in 1 week here in the clinic. If anything worsens or changes patient will contact our office for additional recommendations. DANNE, HEMPLE ([id number]) [id number] Page [name] of [name] Electronic Signature(s) Signed: 02/23/2022 5:33:33 [name] By: [name] PA-C Entered By: [name] on 02/23/2022 17:33:33 -------------------------------------------------------------------------------- SuperBill Details Patient Name: Date of Service: Philipps, [name] 02/23/2022 Medical Record Number: [id number] Patient Account Number: [id number] Date of Birth/Sex: Treating RN: 1955-04-20 (67 y.o. [name] Ang, [name] Primary Care Provider: [name] Other Clinician: Referring Provider: Treating Provider/Extender: [name] in Treatment: 29 Diagnosis Coding ICD-10 Codes Code Description I89.0 Lymphedema, not elsewhere classified I87.333 Chronic venous hypertension (idiopathic) with ulcer and inflammation of bilateral lower extremity L97.828 Non-pressure chronic ulcer of other part of left lower leg with other specified  severity L97.818 Non-pressure chronic ulcer of other part of right lower leg with other specified severity E11.621 Type 2 diabetes mellitus with foot ulcer L97.518 Non-pressure chronic ulcer of other part of right foot with other specified severity Facility Procedures : 6 CPT4 Code: [id number] Description: (Facility Use Only) Apligraf 1 SQ CM Modifier: Quantity: 44 : 3 CPT4 Code: [id number] Description: [id number] - SKIN SUB GRAFT FACE/NK/HF/G ICD-10 Diagnosis Description L97.818 Non-pressure chronic ulcer of other part of right lower leg with other specified Modifier: severity Quantity: 1 Physician Procedures : CPT4 Code Description Modifier [id number] - WC PHYS SKIN SUB GRAFT FACE/NK/HF/G ICD-10 Diagnosis Description L97.818 Non-pressure chronic ulcer of other part of  right lower leg with other specified severity Quantity: 1 Electronic Signature(s) Signed: 02/23/2022 5:34:10 [name] By: [name] PA-C Entered By: [name] on 02/23/2022 17:34:10

## 2022-02-26 NOTE — Progress Notes (Signed)
[name], [name] ([id number]) [id number] Page 1 of 6 Visit Report for 02/23/2022 Arrival Information Details Patient Name: Date of Service: [name], FILAK 02/23/2022 3:15 PM Medical Record Number: [id number] Patient Account Number: [id number] Date of Birth/Sex: Treating RN: 09-19-1955 (67 y.o. M) Primary Care Chidera Thivierge: [name] Other Clinician: Referring Leeman Johnsey: Treating Lynk Marti/Extender: [name] in Treatment: 29 Visit Information History Since Last Visit Added or deleted any medications: No Patient Arrived: Gilford Rile Any new allergies or adverse reactions: No Arrival Time: 15:36 Had a fall or experienced change in No Accompanied By: self activities of daily living that may affect Transfer Assistance: None risk of falls: Patient Identification Verified: Yes Signs or symptoms of abuse/neglect since last visito No Secondary Verification Process Completed: Yes Hospitalized since last visit: No Patient Requires Transmission-Based Precautions: No Implantable device outside of the clinic excluding No Patient Has Alerts: Yes cellular tissue based products placed in the center Patient Alerts: Patient on Blood Thinner since last visit: 01/2021 ABI L 1.13 R 1.06 Has Compression in Place as Prescribed: Yes 01/2021 TBI L amp R 0.8 Pain Present Now: No Electronic Signature(s) Signed: 02/25/2022 12:03:25 PM By: Erenest Blank Entered By: Erenest Blank on 02/23/2022 15:37:49 -------------------------------------------------------------------------------- Compression Therapy Details Patient Name: Date of Service: [name] [name]. 02/23/2022 3:15 PM Medical Record Number: [id number] Patient Account Number: [id number] Date of Birth/Sex: Treating RN: 1955/10/21 (67 y.o. [name] Primary Care Nevelyn Mellott: [name] Other Clinician: Referring Joselynne Killam: Treating Tenae Graziosi/Extender: [name] in Treatment:  29 Compression Therapy Performed for Wound Assessment: Wound #7 Right,Medial Malleolus Performed By: Clinician Deon Pilling, RN Compression Type: Three Layer Post Procedure Diagnosis Same as Pre-procedure Electronic Signature(s) Signed: 02/23/2022 5:48:24 PM By: Deon Pilling RN, BSN Entered By: Deon Pilling on 02/23/2022 16:13:39 [name], [name] ([id number] Page 2 of 6 -------------------------------------------------------------------------------- Encounter Discharge Information Details Patient Name: Date of Service: ALMON, OGAZ 02/23/2022 3:15 PM Medical Record Number: [id number] Patient Account Number: [id number] Date of Birth/Sex: Treating RN: [birth year] (67 y.o. [name] Primary Care Honesty Menta: [name] Other Clinician: Referring Diamon Reddinger: Treating Latorya Bautch/Extender: [name] in Treatment: 29 Encounter Discharge Information Items Post Procedure Vitals Discharge Condition: Stable Temperature (F): 98.2 Ambulatory Status: Walker Pulse (bpm): 82 Discharge Destination: Home Respiratory Rate (breaths/min): 18 Transportation: Private Auto Blood Pressure (mmHg): 139/86 Accompanied By: self Schedule Follow-up Appointment: Yes Clinical Summary of Care: Electronic Signature(s) Signed: 02/23/2022 5:48:24 PM By: Deon Pilling RN, BSN Entered By: Deon Pilling on 02/23/2022 16:20:50 -------------------------------------------------------------------------------- Lower Extremity Assessment Details Patient Name: Date of Service: [name], [name] [name] Merino [name]. 02/23/2022 3:15 PM Medical Record Number: [id number] Patient Account Number: [id number] Date of Birth/Sex: Treating RN: 01/07/1956 (67 y.o. M) Primary Care [name]: [name] Other Clinician: Referring Asael [name]: Treating Luva Metzger/Extender: [name] in Treatment: 29 Edema Assessment Assessed: [name]: No] [name]: No] Edema: [Left: N]  [Right: o] Calf Left: Right: Point of Measurement: 31 cm From Medial Instep 40 cm Ankle Left: Right: Point of Measurement: 11 cm From Medial Instep 26.5 cm Electronic Signature(s) Signed: 02/25/2022 12:03:25 PM By: Erenest Blank Entered By: Erenest Blank on 02/23/2022 15:46:15 -------------------------------------------------------------------------------- Multi-Disciplinary Care Plan Details Patient Name: Date of Service: [name], [name] Huger [name]. 02/23/2022 3:15 PM Medical Record Number: [id number] Patient Account Number: [id number] Date of Birth/Sex: Treating RN: 28-Nov-1955 (67 y.o. [name] Primary Care Kaysia Willard: [name] Other Clinician: Referring Brailee Riede: Treating Samwise Eckardt/Extender: [name] in  Treatment: [address] Slevin [name] ([id number]) [id number] Page 3 of 6 Multidisciplinary Care Plan reviewed with physician Active Inactive Pain, Acute or Chronic Nursing Diagnoses: Pain, acute or chronic: actual or potential Potential alteration in comfort, pain Goals: Patient will verbalize adequate pain control and receive pain control interventions during procedures as needed Date Initiated: 08/04/2021 Target Resolution Date: 03/11/2022 Goal Status: Active Patient/caregiver will verbalize comfort level met Date Initiated: 08/04/2021 Target Resolution Date: 03/11/2022 Goal Status: Active Interventions: Encourage patient to take pain medications as prescribed Provide education on pain management Reposition patient for comfort Treatment Activities: Administer pain control measures as ordered : 08/04/2021 Notes: Electronic Signature(s) Signed: 02/23/2022 5:48:24 PM By: Deon Pilling RN, BSN Entered By: Deon Pilling on 02/23/2022 16:18:47 -------------------------------------------------------------------------------- Pain Assessment Details Patient Name: Date of Service: [name], [name] [name] Merino [name]. 02/23/2022 3:15 PM Medical Record Number:  [id number] Patient Account Number: [id number] Date of Birth/Sex: Treating RN: Jun 03, 1955 (67 y.o. M) Primary Care Shykeem Resurreccion: [name] Other Clinician: Referring Corgan Mormile: Treating Celisa Schoenberg/Extender: [name] in Treatment: 29 Active Problems Location of Pain Severity and Description of Pain Patient Has Paino No Site Locations Pain Management and Medication [name], [name] ([id number] Page 4 of 6 Current Pain Management: Electronic Signature(s) Signed: 02/25/2022 12:03:25 PM By: Erenest Blank Entered By: Erenest Blank on 02/23/2022 15:40:31 -------------------------------------------------------------------------------- Patient/Caregiver Education Details Patient Name: Date of Service: [name] 2/14/2024andnbsp3:15 PM Medical Record Number: [id number] Patient Account Number: [id number] Date of Birth/Gender: Treating RN: [birth year] (67 y.o. [name] Primary Care Physician: [name] Other Clinician: Referring Physician: Treating Physician/Extender: [name] in Treatment: 29 Education Assessment Education Provided To: Patient Education Topics Provided Wound/Skin Impairment: Handouts: Caring for Your Ulcer Methods: Explain/Verbal Responses: Reinforcements needed Electronic Signature(s) Signed: 02/23/2022 5:48:24 PM By: Deon Pilling RN, BSN Entered By: Deon Pilling on 02/23/2022 16:19:07 -------------------------------------------------------------------------------- Wound Assessment Details Patient Name: Date of Service: [name], [name] 02/23/2022 3:15 PM Medical Record Number: [id number] Patient Account Number: [id number] Date of Birth/Sex: Treating RN: 29-Mar-1955 (67 y.o. M) Primary Care Yamaira Spinner: [name] Other Clinician: Referring Akaila Rambo: Treating Sharron Petruska/Extender: [name] in Treatment: 29 Wound Status Wound Number: 7  Primary Venous Leg Ulcer Etiology: Wound Location: Right, Medial Malleolus Secondary Lymphedema Wounding Event: Blister Etiology: Date Acquired: 07/21/2021 Wound Open Weeks Of Treatment: 29 Status: Clustered Wound: No Comorbid Sleep Apnea, Arrhythmia, Congestive Heart Failure, History: Hypertension, Hypotension, Peripheral Arterial Disease, Peripheral Venous Disease, Type II Diabetes, Gout, Osteoarthritis Photos VIOLET, BOIVIN [name] ([id number] Page 5 of 6 Wound Measurements Length: (cm) Width: (cm) Depth: (cm) Area: (cm) Volume: (cm) 4.3 % Reduction in Area: 82.4% 2 % Reduction in Volume: 95% 0.2 Epithelialization: Small (1-33%) 6.754 Tunneling: No 1.351 Undermining: No Wound Description Classification: Full Thickness With Exposed Suppo Wound Margin: Distinct, outline attached Exudate Amount: Medium Exudate Type: Serosanguineous Exudate Color: red, brown rt Structures Foul Odor After Cleansing: No Slough/Fibrino No Wound Bed Granulation Amount: Large (67-100%) Granulation Quality: Red Necrotic Amount: Small (1-33%) Necrotic Quality: Adherent Slough Periwound Skin Texture Texture Color No Abnormalities Noted: No No Abnormalities Noted: No Callus: No Atrophie Blanche: No Crepitus: No Cyanosis: No Excoriation: No Ecchymosis: No Induration: No Erythema: No Rash: No Hemosiderin Staining: No Scarring: No Mottled: No Pallor: No Moisture Rubor: No No Abnormalities Noted: No Dry / Scaly: Yes Maceration: No Treatment Notes Wound #7 (Malleolus) Wound Laterality: Right, Medial Cleanser Peri-Wound Care Triamcinolone 15 (g) Discharge Instruction: Use triamcinolone 15 (g) as directed  Sween Lotion (Moisturizing lotion) Discharge Instruction: Apply moisturizing lotion as directed Topical Primary Dressing apligraf #1 Discharge Instruction: applied by Devorah Givhan. adaptic and steri-strips Discharge Instruction: secured the  apligraf. Secondary Dressing Woven Gauze Sponge, Non-Sterile 4x4 in Discharge Instruction: Apply over primary dressing as directed. Zetuvit Plus 4x8 in Discharge Instruction: Apply over primary dressing as directed. ARSHAD, LAWRENZ ([id number]) [id number] Page 6 of 6 Secured With Compression Wrap ThreePress (3 layer compression wrap) Discharge Instruction: Apply three layer use kerlix instead of cotton compression as directed. Compression Stockings Add-Ons Electronic Signature(s) Signed: 02/25/2022 12:03:25 PM By: Erenest Blank Entered By: Erenest Blank on 02/23/2022 15:48:43 -------------------------------------------------------------------------------- Vitals Details Patient Name: Date of Service: [name], [name] 02/23/2022 3:15 PM Medical Record Number: [id number] Patient Account Number: [id number] Date of Birth/Sex: Treating RN: 08/27/1955 (67 y.o. M) Primary Care Iley Breeden: [name] Other Clinician: Referring Nisa Decaire: Treating Moxon Messler/Extender: [name] in Treatment: 29 Vital Signs Time Taken: 15:40 Temperature (F): 98.2 Height (in): 71 Pulse (bpm): 82 Weight (lbs): 350 Respiratory Rate (breaths/min): 18 Body Mass Index (BMI): 48.8 Blood Pressure (mmHg): 139/86 Reference Range: 80 - 120 mg / dl Electronic Signature(s) Signed: 02/25/2022 12:03:25 PM By: Erenest Blank Entered By: Erenest Blank on 02/23/2022 15:40:24

## 2022-02-28 NOTE — Progress Notes (Signed)
[name], [name] ([id number]) [id number] Page 1 of 6 Visit Report for 01/26/2022 Arrival Information Details Patient Name: Date of Service: [name], [name] 01/26/2022 3:15 PM Medical Record Number: [id number] Patient Account Number: [id number] Date of Birth/Sex: Treating RN: [birth year] (67 y.o. M) Primary Care Gabreal Worton: [name] Other Clinician: Referring Stark Aguinaga: Treating Damarco Keysor/Extender: [name] in Treatment: 25 Visit Information History Since Last Visit Added or deleted any medications: No Patient Arrived: [name] Any new allergies or adverse reactions: No Arrival Time: 15:10 Had a fall or experienced change in No Accompanied By: self activities of daily living that may affect Transfer Assistance: None risk of falls: Patient Identification Verified: Yes Signs or symptoms of abuse/neglect since last visito No Secondary Verification Process Completed: Yes Hospitalized since last visit: No Patient Requires Transmission-Based Precautions: No Implantable device outside of the clinic excluding No Patient Has Alerts: Yes cellular tissue based products placed in the center Patient Alerts: Patient on Blood Thinner since last visit: 01/2021 ABI L 1.13 R 1.06 Has Compression in Place as Prescribed: Yes 01/2021 TBI L amp R 0.8 Pain Present Now: No Electronic Signature(s) Signed: 01/26/2022 4:25:20 PM By: Erenest Blank Entered By: Erenest Blank on 01/26/2022 15:11:32 -------------------------------------------------------------------------------- Compression Therapy Details Patient Name: Date of Service: [name]. 01/26/2022 3:15 PM Medical Record Number: [id number] Patient Account Number: [id number] Date of Birth/Sex: Treating RN: October 07, 1955 (67 y.o. [name] Noe Primary Care Sukaina Toothaker: [name] Other Clinician: Referring Miken Stecher: Treating Chania Kochanski/Extender: [name] in Treatment:  25 Compression Therapy Performed for Wound Assessment: Wound #7 Right,Medial Malleolus Performed By: Clinician Rhae Hammock, RN Compression Type: Three Layer Post Procedure Diagnosis Same as Pre-procedure Electronic Signature(s) Signed: 02/28/2022 10:40:58 AM By: Rhae Hammock RN Entered By: Rhae Hammock on 01/26/2022 15:40:02 [place], [name] ([id number] Page 2 of 6 -------------------------------------------------------------------------------- Encounter Discharge Information Details Patient Name: Date of Service: [name] 01/26/2022 3:15 PM Medical Record Number: [id number] Patient Account Number: [id number] Date of Birth/Sex: Treating RN: 09-Mar-1955 (67 y.o. [name], [name] Primary Care Ladon Vandenberghe: [name] Other Clinician: Referring Sharday Michl: Treating Rion Catala/Extender: [name] in Treatment: 25 Encounter Discharge Information Items Discharge Condition: Stable Ambulatory Status: Walker Discharge Destination: Home Transportation: Private Auto Accompanied By: self Schedule Follow-up Appointment: Yes Clinical Summary of Care: Patient Declined Electronic Signature(s) Signed: 02/28/2022 10:40:58 AM By: Rhae Hammock RN Entered By: Rhae Hammock on 01/26/2022 15:50:45 -------------------------------------------------------------------------------- Lower Extremity Assessment Details Patient Name: Date of Service: [name], [name]. 01/26/2022 3:15 PM Medical Record Number: [id number] Patient Account Number: [id number] Date of Birth/Sex: Treating RN: 10/04/1955 (67 y.o. M) Primary Care Zaidy Absher: [name] Other Clinician: Referring Marc Sivertsen: Treating Marlee Armenteros/Extender: [name] in Treatment: 25 Edema Assessment Assessed: [name]: No] [name]: No] Edema: [Left: N] [Right: o] Calf Left: Right: Point of Measurement: 31 cm From Medial Instep 38.5  cm Ankle Left: Right: Point of Measurement: 11 cm From Medial Instep 28.4 cm Electronic Signature(s) Signed: 01/26/2022 4:25:20 PM By: Erenest Blank Entered By: Erenest Blank on 01/26/2022 15:19:23 -------------------------------------------------------------------------------- Multi-Disciplinary Care Plan Details Patient Name: Date of Service: [name], [name] Huger [name]. 01/26/2022 3:15 PM Medical Record Number: [id number] Patient Account Number: [id number] Date of Birth/Sex: Treating RN: [birth year] (67 y.o. [name], [name] Primary Care Dawan Farney: [name] Other Clinician: Referring Eli Pattillo: Treating Mari Battaglia/Extender: [name] in Treatment: [address], [place] [name] ([id number]) [id number] Page 3 of 6 Multidisciplinary Care Plan reviewed with physician  Active Inactive Pain, Acute or Chronic Nursing Diagnoses: Pain, acute or chronic: actual or potential Potential alteration in comfort, pain Goals: Patient will verbalize adequate pain control and receive pain control interventions during procedures as needed Date Initiated: 08/04/2021 Target Resolution Date: 02/04/2022 Goal Status: Active Patient/caregiver will verbalize comfort level met Date Initiated: 08/04/2021 Target Resolution Date: 02/11/2022 Goal Status: Active Interventions: Encourage patient to take pain medications as prescribed Provide education on pain management Reposition patient for comfort Treatment Activities: Administer pain control measures as ordered : 08/04/2021 Notes: Electronic Signature(s) Signed: 02/28/2022 10:40:58 AM By: Rhae Hammock RN Entered By: Rhae Hammock on 01/26/2022 15:39:18 -------------------------------------------------------------------------------- Pain Assessment Details Patient Name: Date of Service: [name], [name]. 01/26/2022 3:15 PM Medical Record Number: [id number] Patient Account Number: [id number] Date of Birth/Sex: Treating  RN: 08-15-1955 (67 y.o. M) Primary Care Nevia Henkin: [name] Other Clinician: Referring Ardice Boyan: Treating Braeden Kennan/Extender: [name] in Treatment: 25 Active Problems Location of Pain Severity and Description of Pain Patient Has Paino No Site Locations Pain Management and Medication [name], [name] ([id number].pdf Page 4 of 6 Current Pain Management: Electronic Signature(s) Signed: 01/26/2022 4:25:20 PM By: Erenest Blank Entered By: Erenest Blank on 01/26/2022 15:13:38 -------------------------------------------------------------------------------- Patient/Caregiver Education Details Patient Name: Date of Service: [name] 1/17/2024andnbsp3:15 PM Medical Record Number: [id number] Patient Account Number: [id number] Date of Birth/Gender: Treating RN: 31-Mar-1955 (67 y.o. [name] Noe Primary Care Physician: [name] Other Clinician: Referring Physician: Treating Physician/Extender: [name] in Treatment: 25 Education Assessment Education Provided To: Patient Education Topics Provided Wound/Skin Impairment: Methods: Explain/Verbal Responses: Reinforcements needed, State content correctly [employer]) Signed: 02/28/2022 10:40:58 AM By: Rhae Hammock RN Entered By: Rhae Hammock on 01/26/2022 15:39:33 -------------------------------------------------------------------------------- Wound Assessment Details Patient Name: Date of Service: [name], [name] 01/26/2022 3:15 PM Medical Record Number: [id number] Patient Account Number: [id number] Date of Birth/Sex: Treating RN: [birth year] (67 y.o. M) Primary Care Nohea Kras: [name] Other Clinician: Referring Trebor Galdamez: Treating Kennethia Lynes/Extender: [name] in Treatment: 25 Wound Status Wound Number: 7 Primary Venous Leg Ulcer Etiology: Wound Location: Right, Medial  Malleolus Secondary Lymphedema Wounding Event: Blister Etiology: Date Acquired: 07/21/2021 Wound Open Weeks Of Treatment: 25 Status: Clustered Wound: No Comorbid Sleep Apnea, Arrhythmia, Congestive Heart Failure, History: Hypertension, Hypotension, Peripheral Arterial Disease, Peripheral Venous Disease, Type II Diabetes, Gout, Osteoarthritis Photos [name], UPLINGER [name] ([id number].pdf Page 5 of 6 Wound Measurements Length: (cm) 4 Width: (cm) 2 Depth: (cm) 0.2 Area: (cm) 6.283 Volume: (cm) 1.257 % Reduction in Area: 83.6% % Reduction in Volume: 95.3% Epithelialization: Small (1-33%) Tunneling: No Undermining: No Wound Description Classification: Full Thickness With Exposed Suppo Exudate Amount: Medium Exudate Type: Serosanguineous Exudate Color: red, brown rt Structures Foul Odor After Cleansing: No Slough/Fibrino No Wound Bed Granulation Amount: Large (67-100%) Granulation Quality: Red Necrotic Amount: Small (1-33%) Necrotic Quality: Adherent Slough Periwound Skin Texture Texture Color No Abnormalities Noted: No No Abnormalities Noted: No Callus: No Atrophie Blanche: No Crepitus: No Cyanosis: No Excoriation: No Ecchymosis: No Induration: No Erythema: No Rash: No Hemosiderin Staining: No Scarring: No Mottled: No Pallor: No Moisture Rubor: No No Abnormalities Noted: No Dry / Scaly: Yes Maceration: No Electronic Signature(s) Signed: 01/26/2022 4:25:20 PM By: Erenest Blank Entered By: Erenest Blank on 01/26/2022 15:21:35 -------------------------------------------------------------------------------- Vitals Details Patient Name: Date of Service: Wojdyla, [name] 01/26/2022 3:15 PM Medical Record Number: [id number] Patient Account Number: [id number] Date of Birth/Sex: Treating RN: 03/16/1955 (67 y.o. M) Primary  Care Vonte Rossin: [name] Other Clinician: Referring Ceniyah Thorp: Treating Zaylei Mullane/Extender: [name] in Treatment: 25 Vital Signs Time Taken: 15:11 Temperature (F): 98.2 Height (in): 71 Pulse (bpm): 89 Weight (lbs): 350 Respiratory Rate (breaths/min): 18 Body Mass Index (BMI): 48.8 Blood Pressure (mmHg): 133/86 MELDON, JAHODA [name] ([id number].pdf Page 6 of 6 Reference Range: 80 - 120 mg / dl Electronic Signature(s) Signed: 01/26/2022 4:25:20 PM By: Erenest Blank Entered By: Erenest Blank on 01/26/2022 15:13:23

## 2022-03-02 ENCOUNTER — Encounter (HOSPITAL_BASED_OUTPATIENT_CLINIC_OR_DEPARTMENT_OTHER): Payer: Medicare Other | Admitting: Physician Assistant

## 2022-03-02 DIAGNOSIS — I872 Venous insufficiency (chronic) (peripheral): Secondary | ICD-10-CM | POA: Diagnosis not present

## 2022-03-02 DIAGNOSIS — I87333 Chronic venous hypertension (idiopathic) with ulcer and inflammation of bilateral lower extremity: Secondary | ICD-10-CM | POA: Diagnosis not present

## 2022-03-02 DIAGNOSIS — E11621 Type 2 diabetes mellitus with foot ulcer: Secondary | ICD-10-CM | POA: Diagnosis not present

## 2022-03-02 DIAGNOSIS — Z6841 Body Mass Index (BMI) 40.0 and over, adult: Secondary | ICD-10-CM | POA: Diagnosis not present

## 2022-03-02 DIAGNOSIS — I890 Lymphedema, not elsewhere classified: Secondary | ICD-10-CM | POA: Diagnosis not present

## 2022-03-02 DIAGNOSIS — Z950 Presence of cardiac pacemaker: Secondary | ICD-10-CM | POA: Diagnosis not present

## 2022-03-02 DIAGNOSIS — L97518 Non-pressure chronic ulcer of other part of right foot with other specified severity: Secondary | ICD-10-CM | POA: Diagnosis not present

## 2022-03-02 DIAGNOSIS — Z89432 Acquired absence of left foot: Secondary | ICD-10-CM | POA: Diagnosis not present

## 2022-03-02 DIAGNOSIS — L97818 Non-pressure chronic ulcer of other part of right lower leg with other specified severity: Secondary | ICD-10-CM | POA: Diagnosis not present

## 2022-03-02 DIAGNOSIS — L97318 Non-pressure chronic ulcer of right ankle with other specified severity: Secondary | ICD-10-CM | POA: Diagnosis not present

## 2022-03-02 DIAGNOSIS — E1151 Type 2 diabetes mellitus with diabetic peripheral angiopathy without gangrene: Secondary | ICD-10-CM | POA: Diagnosis not present

## 2022-03-02 DIAGNOSIS — Z7901 Long term (current) use of anticoagulants: Secondary | ICD-10-CM | POA: Diagnosis not present

## 2022-03-02 DIAGNOSIS — I4891 Unspecified atrial fibrillation: Secondary | ICD-10-CM | POA: Diagnosis not present

## 2022-03-02 DIAGNOSIS — Z8572 Personal history of non-Hodgkin lymphomas: Secondary | ICD-10-CM | POA: Diagnosis not present

## 2022-03-02 DIAGNOSIS — L97828 Non-pressure chronic ulcer of other part of left lower leg with other specified severity: Secondary | ICD-10-CM | POA: Diagnosis not present

## 2022-03-03 NOTE — Progress Notes (Addendum)
GOR, THRAPP ([id number]) [id number] Page 1 of 10 Visit Report for 03/02/2022 Chief Complaint Document Details Patient Name: Date of Service: [name], [name] 03/02/2022 3:00 PM Medical Record Number: [id number] Patient Account Number: [id number] Date of Birth/Sex: Treating RN: Sep 25, 1955 (67 y.o. M) Primary Care Provider: [name] Other Clinician: Referring Provider: Treating Provider/Extender: [name] in Treatment: 30 Information Obtained from: Patient Chief Complaint 08/04/2021; patient returns to clinic with bilateral leg wounds as well as areas on the right foot Electronic Signature(s) Signed: 03/02/2022 3:08:51 PM By: [name] PA-C Entered By: [name] on 03/02/2022 15:08:51 -------------------------------------------------------------------------------- Cellular or Tissue Based Product Details Patient Name: Date of Service: [name], [name]. 03/02/2022 3:00 PM Medical Record Number: [id number] Patient Account Number: [id number] Date of Birth/Sex: Treating RN: 1955/01/24 (67 y.o. [name] Primary Care Provider: [name] Other Clinician: Referring Provider: Treating Provider/Extender: [name] in Treatment: 30 Cellular or Tissue Based Product Type Wound #7 Right,Medial Malleolus Applied to: Performed By: Physician [name], PA Cellular or Tissue Based Product Type: Apligraf Level of Consciousness (Pre-procedure): Awake and Alert Pre-procedure Verification/Time Out Yes - 15:30 Taken: Location: genitalia / hands / feet / multiple digits Wound Size (sq cm): 11.61 Product Size (sq cm): 44 Waste Size (sq cm): 22 Waste Reason: wound size Amount of Product Applied (sq cm): 22 Instrument Used: Forceps, Scissors Lot #: [id number] Order #: 2 Expiration Date: 03/04/2022 Fenestrated: Yes Instrument: Blade Reconstituted: Yes Solution Type: normal  saline Solution Amount: 10 mL Lot #: [id number] Solution Expiration Date: 06/09/2024 Secured: Yes Secured With: Steri-Strips, adaptic Dressing Applied: No Procedural Pain: 0 Post Procedural Pain: 0 Response to Treatment: Procedure was tolerated well SAMEH, PERI [name] ([id number]) [phone].pdf Page 2 of 10 Level of Consciousness (Post- Awake and Alert procedure): Post Procedure Diagnosis Same as Pre-procedure Electronic Signature(s) Signed: 03/02/2022 5:20:51 PM By: Deon Pilling RN, BSN Signed: 03/02/2022 5:24:38 PM By: [name] PA-C Entered By: Deon Pilling on 03/02/2022 15:34:50 -------------------------------------------------------------------------------- HPI Details Patient Name: Date of Service: [name], [name] 03/02/2022 3:00 PM Medical Record Number: [id number] Patient Account Number: [id number] Date of Birth/Sex: Treating RN: 1955/02/28 (67 y.o. M) Primary Care Provider: [name] Other Clinician: Referring Provider: Treating Provider/Extender: [name] in Treatment: 30 History of Present Illness HPI Description: ADMISSION 03/22/2021 This is a 67 year old man with a past medical history significant for diabetes type 2, congestive heart failure, peripheral arterial disease, morbid obesity, venous insufficiency, and coronary artery disease. He has been followed by [name] in podiatry, who performed a transmetatarsal amputation on the left foot in August 2022. He had issues healing that wound, but based upon Dr. [name] notes, ultimately the TMA wound healed. During his recovery from that surgery, however, ulcers opened up over the DIP joint of the right second and third toe. These have apparently closed and reopened multiple times. It sounds like one of the issues has been moisture accumulation and maceration of the tissues causing them to reopen. At his last visit with [name], on March 01, 2021, there  continues to be problems with moisture and he was referred to wound care for further evaluation and management. He had a formal aortogram with runoff performed prior to his TMA. The findings are copied here: Patient has inline flow to both feet with no significant flow-limiting lesion that would be amenable to percutaneous or open revascularization. He does have an element of  small vessel disease and has a short segment occlusion of the distal anterior tibial/dorsalis pedis artery on the left foot but does have posterior tibial artery flow. Would recommend management of wounds with amputation of toes 2 and 3 on the right foot if the wounds do not heal and deteriorate. Transmetatarsal amputation on the left side has as good a blood supply as it is going to get and hopefully this will heal in the future. Formal ABIs were done in January 2023. They are normal bilaterally. ABI Findings: +---------+------------------+-----+----------+--------+ Right Rt Pressure (mmHg)IndexWaveform Comment  +---------+------------------+-----+----------+--------+ Brachial 160     +---------+------------------+-----+----------+--------+ PTA 192 1.06 monophasic  +---------+------------------+-----+----------+--------+ DP 159 0.88 monophasic  +---------+------------------+-----+----------+--------+ Great T oe145 0.80    +---------+------------------+-----+----------+--------+ +--------+------------------+-----+---------+-------+ Left Lt Pressure (mmHg)IndexWaveform Comment +--------+------------------+-----+---------+-------+ [id number]     +--------+------------------+-----+---------+-------+ PTA 204 1.13 triphasic  +--------+------------------+-----+---------+-------+ DP 194 1.07 biphasic   +--------+------------------+-----+---------+-------+ +-------+-----------+-----------+------------+------------+ ABI/TBIT oday's ABIT oday's TBIPrevious  ABIPrevious TBI +-------+-----------+-----------+------------+------------+ Right 1.06 0.8 1.26 0.65  +-------+-----------+-----------+------------+------------+ Left 1.13 amputation 1.15 amputation  +-------+-----------+-----------+------------+------------+ Previous ABI on 08/06/20 at [place], [name] ([id number]) [phone].pdf Page 3 of 10 Pedal pressures falsely elevated due to medial calcification. Summary: Right: Resting right ankle-brachial index is within normal range. The right toe-brachial index is normal. Left: Resting left ankle-brachial index is within normal range. READMISSION 08/04/2021 Mr. Lohan is now a 67 year old man who I remember from this clinic many years ago I think he had a right lower extremity predominantly venous wound at the time. He was here for 1 visit in March of this year had wounds on his right second and third toes we apparently dressed them many and they healed so he did not come back. He is listed in [place] is being a diabetic although the patient denies this says he is verified it with his primary doctor. In any case over the last several weeks or so according the patient although these wounds look somewhat more chronic than that he has developed predominantly large wounds on the right medial and right lateral ankle smaller areas on the left leg and areas on the dorsal aspect of his right second and third toes. Its not clear how he has been dressing these. 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 The patient has known PAD. He had an angiogram in August 2022 at which time he had nonhealing wounds in both feet. On the left his major vessels in the thigh were all patent. He had three-vessel patent to the level of the ankle. He had a very short occlusion in the left anterior tibial. On the right lower extremity the major vessels in  his thigh were all patent. He had three-vessel runoff to the foot sluggish filling of the anterior tibial artery. He was felt to have some component of small vessel disease but nothing that was amenable or needed revascularization. It was recommended that he have amputation of the second and third toes on the right foot if they did not heal He has been following with [name] of podiatry. [name] got him juxta lite stockings although I do not think he had them on properly he has uncontrolled edema in both legs Past medical history includes type 2 diabetes [although the patient really denies this], left TMA in 2022,lower extremity wounds in fact attendance at this clinic in 2009-2010, A-fib on Eliquis, chronic venous insufficiency with secondary lymphedema history of non-Hodgkin's lymphoma. 08-11-2021 upon evaluation  today patient presents for follow-up evaluation he was seen last Wednesday initially for inspection here in our clinic. With that being said he tells me that he unfortunately has been having a lot of drainage and is actually coming through his wrap. Fortunately I do not see any evidence of active infection locally or systemically at this time which is great news. No fevers, chills, nausea, vomiting, or diarrhea. With that being said there does appear to be some evidence of local infection based on what I am seeing today. 08-18-2021 upon evaluation today patient appears to be doing okay currently in regard to his wounds with that being said that he is doing much better but still has a long ways to go to get to where he wanted to be. I think the infection is significantly improved. He has another week of the antibiotic at this point. 08-25-2021 upon evaluation today patient's wounds are actually doing decently well he has erythema has significantly improved. I think the cellulitis is under controlled I am going to continue him on 2 more weeks of the Levaquin at 500 mg this is a lower dose but  I am hoping it will be better for him. 09-08-2021 upon evaluation today patient appears to be doing excellent in regard to his wounds. Since I last saw him he was actually in the hospital where he ended up having a pacemaker put in. Subsequently he tells me that he is actually doing quite well although they were unsure whether they were going to do it due to the fact that he had the wounds on his legs. And then I am glad they did anything seem to be doing well. 09-15-2021 upon evaluation patient's wounds are actually showing signs of improvement. The right side wounds do appear to have some need for sharp debridement today and I Georgina Peer go ahead and proceed with that. I think that if we get the wounds cleaned up he will actually show signs of continued improvement. I am also leaning towards switching to [facility] which I think will be a much better option for him. 09-22-2021 patient's wounds are showing signs of excellent improvement. I am actually extremely pleased with where we stand and I think that the patient is making great progress. There does not appear to be any signs of active infection. 09-29-2021 upon evaluation today patient appears to be doing excellent in regard to his wounds. He is actually tolerating the dressing changes without complication. Fortunately I see no evidence of active infection locally or systemically at this time which is great news and overall I am extremely pleased with where we stand currently. 09-2721 upon evaluation today patient actually appears to be doing excellent in regard to his wounds. The left leg is almost completely healed the right ankle is significantly smaller. Overall I am extremely pleased with where we stand at this point. I do not see any evidence of active infection at this time. 10-13-2021 upon evaluation today patient appears to be doing excellent in regard to his wounds. I really feel like he is making good progress here and I am very pleased in  that regard. Fortunately I do not see any signs of active infection at this time. We are using the [facility] topical antibiotic therapy. 10-20-2021 upon evaluation today patient appears to be doing well with regard to his wound on the right medial ankle region the left leg is almost completely healed. I am actually very pleased with where we stand today. 11-03-2021 upon evaluation  today patient's wound actually is going require some sharp debridement but appears to be doing much better which is great news. Fortunately I do not see any signs of active infection at this time. 11-10-2021 upon evaluation today patient's wound is actually showing signs of excellent improvement. Fortunately I do not see any evidence of infection locally or systemically which is great news and overall I am extremely pleased with where we stand today. I do believe he is making good progress he does have his [facility] topical antibiotics with him here today. 11-24-2021 upon evaluation today patient actually showing signs of excellent improvement this appears to be doing much better. Fortunately I do not see any evidence of infection locally or systemically at this time. 12-01-2021 upon evaluation today patient appears to be doing well currently in regard to his wound. He has been tolerating the dressing changes without complication. Fortunately I do not see any evidence of active infection at this time which is great news and overall I am extremely pleased with where we stand today. 12-15-2021 upon evaluation today patient appears to be doing well with regard to his wound. He is showing signs of improvement is slow but nonetheless we are making improvements here. Fortunately I do not see any evidence of active infection locally nor systemically at this time. We are still using the [facility] topical antibiotic over the open area only. 12-29-2021 upon evaluation today patient's wound actually is showing signs of excellent  improvement. It has been 2 weeks since I perform any debridement and it definitely shows he has some tissue that needs to be cleaned away but I think we can do so quite easily and readily today. The good news is I do not see any signs of infection I think he is doing much better in that regard. Overall I am extremely pleased with where we stand. 01-12-2022 upon evaluation today patient appears to be doing well currently in regard to his wound although it is not getting significantly smaller it is also not getting any larger. Fortunately I do not see any evidence of infection locally nor systemically which is great news and overall I am extremely pleased with where things stand currently. [name], [name] ([id number]) [id number] Page 4 of 10 01-19-2022 upon evaluation today patient appears to be doing well currently in regard to his wound. The PolyMem actually seems to have done extremely well for him. Fortunately I do not see any signs of infection locally nor systemically at this time. 01-26-2022 upon evaluation today patient appears to be making progress. Fortunately there does not appear to be any signs of infection which is great news. No fevers, chills, nausea, vomiting, or diarrhea. With that being said this is very slow to heal and although it is smaller I still feel like we may want to try to do something to speed this up I think that a skin substitute could be beneficial, look into Kerecis. 02-02-2022 upon evaluation today patient's wound is actually showing signs of excellent improvement. I do not see any evidence of infection and overall I think that we are headed in the right direction. Fortunately I think that he is tolerating the dressing changes without complication. 02-09-2022 upon evaluation today patient appears to be doing well currently in regard to his wound. It does look a little bit macerated but fortunately does not appear to be showing any signs of  significant skin breakdown over the macerated area which is good news. Fortunately I do not see any evidence  of active infection locally nor systemically at this point which is great news. 02-23-2022 upon evaluation today patient appears to be doing well currently in regard to his wounds. In fact his area on the ankle is actually showing signs of healing quite nicely. We do have the Apligraf ready today I am hopeful this is going to speed things up and get this closed much more effectively and quickly. Fortunately I do not see any signs of active infection locally nor systemically at this time which is great news. 03-02-2022 upon evaluation today patient appears to be doing excellent in regard to his wound. He is actually been tolerating the dressing changes without complication the wound on the left medial lower extremity is doing quite well and Apligraf seems to have been extremely beneficial for him. I am extremely pleased with where we stand today. Electronic Signature(s) Signed: 03/02/2022 4:15:22 PM By: [name] PA-C Entered By: [name] on 03/02/2022 16:15:22 -------------------------------------------------------------------------------- Physical Exam Details Patient Name: Date of Service: Karen, [name] 03/02/2022 3:00 PM Medical Record Number: [id number] Patient Account Number: [id number] Date of Birth/Sex: Treating RN: 05/03/1955 (67 y.o. M) Primary Care Provider: [name] Other Clinician: Referring Provider: Treating Provider/Extender: [name] in Treatment: 54 Constitutional Well-nourished and well-hydrated in no acute distress. Respiratory normal breathing without difficulty. Psychiatric this patient is able to make decisions and demonstrates good insight into disease process. Alert and Oriented x 3. pleasant and cooperative. Notes Upon inspection patient's wound bed again showed signs of excellent granulation epithelization or  sharp debridement was necessary today and overall I do feel like that he is on the right track he is here today for the second application of Apligraf. Electronic Signature(s) Signed: 03/02/2022 4:15:53 PM By: [name] PA-C Entered By: [name] on 03/02/2022 16:15:53 -------------------------------------------------------------------------------- Physician Orders Details Patient Name: Date of Service: [name], [name] 03/02/2022 3:00 PM Medical Record Number: [id number] Patient Account Number: [id number] Date of Birth/Sex: Treating RN: 1955-11-07 (67 y.o. [name] Primary Care Provider: [name] Other Clinician: Referring Provider: Treating Provider/Extender: [name] in Treatment: 30 Verbal / Phone Orders: No [name], [name] ([id number]) [id number] Page 5 of 10 Diagnosis Coding ICD-10 Coding Code Description I89.0 Lymphedema, not elsewhere classified I87.333 Chronic venous hypertension (idiopathic) with ulcer and inflammation of bilateral lower extremity L97.828 Non-pressure chronic ulcer of other part of left lower leg with other specified severity L97.818 Non-pressure chronic ulcer of other part of right lower leg with other specified severity E11.621 Type 2 diabetes mellitus with foot ulcer L97.518 Non-pressure chronic ulcer of other part of right foot with other specified severity Follow-up Appointments ppointment in 2 weeks. - 03/16/2022 Dr. Celine Ahr to see patient and apply apligraf Wednesday Return A Nurse Visit: Margarita Grizzle 03/09/2022 1230pm; Wednesday Anesthetic (In clinic) Topical Lidocaine 5% applied to wound bed Cellular or Tissue Based Products Cellular or Tissue Based Product Type: - Run IVR for Kerrecis- denied #1 Apligraf applied 02/23/2022 #2 Apligraf applied 03/02/2022 Cellular or Tissue Based Product applied to wound bed, secured with steri-strips, cover with Adaptic or Mepitel. (DO NOT  REMOVE). Bathing/ Shower/ Hygiene May shower with protection but do not get wound dressing(s) wet. Protect dressing(s) with water repellant cover (for example, large plastic bag) or a cast cover and may then take shower. Edema Control - Lymphedema / SCD / Other Elevate legs to the level of the heart or above for 30 minutes daily and/or  when sitting for 3-4 times a day throughout the day. Avoid standing for long periods of time. Exercise regularly Moisturize legs daily. - apply every night before bed to left leg. Compression stocking or Garment 30-40 mm/Hg pressure to: - wear your VIVE compression stocking to left leg. apply in the morning and remove at night. Will apply tubigrip size [name] to left leg. Remove once you get home and use your compression stockings. Wound Treatment Wound #7 - Malleolus Wound Laterality: Right, Medial Peri-Wound Care: Triamcinolone 15 (g) 1 x Per Week/30 Days Discharge Instructions: Use triamcinolone 15 (g) as directed Peri-Wound Care: Sween Lotion (Moisturizing lotion) 1 x Per Week/30 Days Discharge Instructions: Apply moisturizing lotion as directed Prim Dressing: apligraf #2 1 x Per Week/30 Days ary Discharge Instructions: applied by provider. Prim Dressing: adaptic and steri-strips 1 x Per Week/30 Days ary Discharge Instructions: secured the apligraf. Secondary Dressing: Woven Gauze Sponge, Non-Sterile 4x4 in 1 x Per Week/30 Days Discharge Instructions: Apply over primary dressing as directed. Secondary Dressing: Zetuvit Plus 4x8 in 1 x Per Week/30 Days Discharge Instructions: Apply over primary dressing as directed. Compression Wrap: ThreePress (3 layer compression wrap) 1 x Per Week/30 Days Discharge Instructions: Apply three layer use kerlix instead of cotton compression as directed. Electronic Signature(s) Signed: 03/02/2022 5:20:51 PM By: Deon Pilling RN, BSN Signed: 03/02/2022 5:24:38 PM By: [name] PA-C Entered By: Deon Pilling on 03/02/2022  15:38:55 Problem List Details -------------------------------------------------------------------------------- [name] ([id number]) [id number] Page 6 of 10 Patient Name: Date of Service: [name], [name] 03/02/2022 3:00 PM Medical Record Number: [id number] Patient Account Number: [id number] Date of Birth/Sex: Treating RN: 01/01/1956 (67 y.o. M) Primary Care Provider: [name] Other Clinician: Referring Provider: Treating Provider/Extender: [name] in Treatment: 30 Active Problems ICD-10 Encounter Code Description Active Date MDM Diagnosis I89.0 Lymphedema, not elsewhere classified 08/04/2021 No Yes I87.333 Chronic venous hypertension (idiopathic) with ulcer and inflammation of 08/04/2021 No Yes bilateral lower extremity L97.828 Non-pressure chronic ulcer of other part of left lower leg with other specified 08/04/2021 No Yes severity L97.818 Non-pressure chronic ulcer of other part of right lower leg with other specified 08/04/2021 No Yes severity E11.621 Type 2 diabetes mellitus with foot ulcer 08/04/2021 No Yes L97.518 Non-pressure chronic ulcer of other part of right foot with other specified 08/04/2021 No Yes severity Inactive Problems Resolved Problems Electronic Signature(s) Signed: 03/02/2022 3:08:42 PM By: [name] PA-C Entered By: [name] on 03/02/2022 15:08:42 -------------------------------------------------------------------------------- Progress Note Details Patient Name: Date of Service: [name], [name] 03/02/2022 3:00 PM Medical Record Number: [id number] Patient Account Number: [id number] Date of Birth/Sex: Treating RN: 22-Jun-1955 (67 y.o. M) Primary Care Provider: [name] Other Clinician: Referring Provider: Treating Provider/Extender: [name] in Treatment: 30 Subjective Chief Complaint Information obtained from Patient 08/04/2021; patient returns to  clinic with bilateral leg wounds as well as areas on the right foot History of Present Illness (HPI) ADMISSION 03/22/2021 [name], [name] ([id number]) [phone].pdf Page 7 of 10 This is a 67 year old man with a past medical history significant for diabetes type 2, congestive heart failure, peripheral arterial disease, morbid obesity, venous insufficiency, and coronary artery disease. He has been followed by [name] in podiatry, who performed a transmetatarsal amputation on the left foot in August 2022. He had issues healing that wound, but based upon Dr. [name] notes, ultimately the TMA wound healed. During his recovery from that surgery, however, ulcers opened up over the  DIP joint of the right second and third toe. These have apparently closed and reopened multiple times. It sounds like one of the issues has been moisture accumulation and maceration of the tissues causing them to reopen. At his last visit with [name], on March 01, 2021, there continues to be problems with moisture and he was referred to wound care for further evaluation and management. He had a formal aortogram with runoff performed prior to his TMA. The findings are copied here: Patient has inline flow to both feet with no significant flow-limiting lesion that would be amenable to percutaneous or open revascularization. He does have an element of small vessel disease and has a short segment occlusion of the distal anterior tibial/dorsalis pedis artery on the left foot but does have posterior tibial artery flow. Would recommend management of wounds with amputation of toes 2 and 3 on the right foot if the wounds do not heal and deteriorate. Transmetatarsal amputation on the left side has as good a blood supply as it is going to get and hopefully this will heal in the future. Formal ABIs were done in January 2023. They are normal bilaterally. ABI  Findings: +---------+------------------+-----+----------+--------+ Right Rt Pressure (mmHg)IndexWaveform Comment  +---------+------------------+-----+----------+--------+ Brachial 160    +---------+------------------+-----+----------+--------+ PTA 192 1.06 monophasic  +---------+------------------+-----+----------+--------+ DP 159 0.88 monophasic  +---------+------------------+-----+----------+--------+ Great T oe145 0.80    +---------+------------------+-----+----------+--------+ +--------+------------------+-----+---------+-------+ Left Lt Pressure (mmHg)IndexWaveform Comment +--------+------------------+-----+---------+-------+ [id number]    +--------+------------------+-----+---------+-------+ PTA 204 1.13 triphasic  +--------+------------------+-----+---------+-------+ DP 194 1.07 biphasic   +--------+------------------+-----+---------+-------+ +-------+-----------+-----------+------------+------------+ ABI/TBIT oday's ABIT oday's TBIPrevious ABIPrevious TBI +-------+-----------+-----------+------------+------------+ Right 1.06 0.8 1.26 0.65  +-------+-----------+-----------+------------+------------+ Left 1.13 amputation 1.15 amputation  +-------+-----------+-----------+------------+------------+ Previous ABI on 08/06/20 at [facility] Pedal pressures falsely elevated due to medial calcification. Summary: Right: Resting right ankle-brachial index is within normal range. The right toe-brachial index is normal. Left: Resting left ankle-brachial index is within normal range. READMISSION 08/04/2021 Mr. Rohn is now a 67 year old man who I remember from this clinic many years ago I think he had a right lower extremity predominantly venous wound at the time. He was here for 1 visit in March of this year had wounds on his right second and third toes we apparently dressed them many and they healed so he did not  come back. He is listed in [place] is being a diabetic although the patient denies this says he is verified it with his primary doctor. In any case over the last several weeks or so according the patient although these wounds look somewhat more chronic than that he has developed predominantly large wounds on the right medial and right lateral ankle smaller areas on the left leg and areas on the dorsal aspect of his right second and third toes. Its not clear how he has been dressing these. 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 The patient has known PAD. He had an angiogram in August 2022 at which time he had nonhealing wounds in both feet. On the left his major vessels in the thigh were all patent. He had three-vessel patent to the level of the ankle. He had a very short occlusion in the left anterior tibial. On the right lower extremity the major vessels in his thigh were all patent. He had three-vessel runoff to the foot sluggish filling of the anterior tibial artery. He was felt to have some component of small vessel disease but nothing that was amenable or  needed revascularization. It was recommended that he have amputation of the second and third toes on the right foot if they did not heal He has been following with [name] of podiatry. [name] got him juxta lite stockings although I do not think he had them on properly he has uncontrolled edema in both legs Past medical history includes type 2 diabetes [although the patient really denies this], left TMA in 2022,lower extremity wounds in fact attendance at this clinic in 2009-2010, A-fib on Eliquis, chronic venous insufficiency with secondary lymphedema history of non-Hodgkin's lymphoma. 08-11-2021 upon evaluation today patient presents for follow-up evaluation he was seen last Wednesday initially for inspection here in our clinic. With that being said he  tells me that he unfortunately has been having a lot of drainage and is actually coming through his wrap. Fortunately I do not see any evidence of active infection locally or systemically at this time which is great news. No fevers, chills, nausea, vomiting, or diarrhea. With that being said there does appear to be some evidence of local infection based on what I am seeing today. 08-18-2021 upon evaluation today patient appears to be doing okay currently in regard to his wounds with that being said that he is doing much better but still has a long ways to go to get to where he wanted to be. I think the infection is significantly improved. He has another week of the antibiotic at this point. 08-25-2021 upon evaluation today patient's wounds are actually doing decently well he has erythema has significantly improved. I think the cellulitis is under controlled I am going to continue him on 2 more weeks of the Levaquin at 500 mg this is a lower dose but I am hoping it will be better for him. [name], [name] ([id number]) [id number] Page 8 of 10 09-08-2021 upon evaluation today patient appears to be doing excellent in regard to his wounds. Since I last saw him he was actually in the hospital where he ended up having a pacemaker put in. Subsequently he tells me that he is actually doing quite well although they were unsure whether they were going to do it due to the fact that he had the wounds on his legs. And then I am glad they did anything seem to be doing well. 09-15-2021 upon evaluation patient's wounds are actually showing signs of improvement. The right side wounds do appear to have some need for sharp debridement today and I Georgina Peer go ahead and proceed with that. I think that if we get the wounds cleaned up he will actually show signs of continued improvement. I am also leaning towards switching to [facility] which I think will be a much better option for him. 09-22-2021  patient's wounds are showing signs of excellent improvement. I am actually extremely pleased with where we stand and I think that the patient is making great progress. There does not appear to be any signs of active infection. 09-29-2021 upon evaluation today patient appears to be doing excellent in regard to his wounds. He is actually tolerating the dressing changes without complication. Fortunately I see no evidence of active infection locally or systemically at this time which is great news and overall I am extremely pleased with where we stand currently. 09-2721 upon evaluation today patient actually appears to be doing excellent in regard to his wounds. The left leg is almost completely healed the right ankle is significantly smaller. Overall I am extremely pleased with where we stand at  this point. I do not see any evidence of active infection at this time. 10-13-2021 upon evaluation today patient appears to be doing excellent in regard to his wounds. I really feel like he is making good progress here and I am very pleased in that regard. Fortunately I do not see any signs of active infection at this time. We are using the [facility] topical antibiotic therapy. 10-20-2021 upon evaluation today patient appears to be doing well with regard to his wound on the right medial ankle region the left leg is almost completely healed. I am actually very pleased with where we stand today. 11-03-2021 upon evaluation today patient's wound actually is going require some sharp debridement but appears to be doing much better which is great news. Fortunately I do not see any signs of active infection at this time. 11-10-2021 upon evaluation today patient's wound is actually showing signs of excellent improvement. Fortunately I do not see any evidence of infection locally or systemically which is great news and overall I am extremely pleased with where we stand today. I do believe he is making good progress he does have  his [facility] topical antibiotics with him here today. 11-24-2021 upon evaluation today patient actually showing signs of excellent improvement this appears to be doing much better. Fortunately I do not see any evidence of infection locally or systemically at this time. 12-01-2021 upon evaluation today patient appears to be doing well currently in regard to his wound. He has been tolerating the dressing changes without complication. Fortunately I do not see any evidence of active infection at this time which is great news and overall I am extremely pleased with where we stand today. 12-15-2021 upon evaluation today patient appears to be doing well with regard to his wound. He is showing signs of improvement is slow but nonetheless we are making improvements here. Fortunately I do not see any evidence of active infection locally nor systemically at this time. We are still using the [facility] topical antibiotic over the open area only. 12-29-2021 upon evaluation today patient's wound actually is showing signs of excellent improvement. It has been 2 weeks since I perform any debridement and it definitely shows he has some tissue that needs to be cleaned away but I think we can do so quite easily and readily today. The good news is I do not see any signs of infection I think he is doing much better in that regard. Overall I am extremely pleased with where we stand. 01-12-2022 upon evaluation today patient appears to be doing well currently in regard to his wound although it is not getting significantly smaller it is also not getting any larger. Fortunately I do not see any evidence of infection locally nor systemically which is great news and overall I am extremely pleased with where things stand currently. 01-19-2022 upon evaluation today patient appears to be doing well currently in regard to his wound. The PolyMem actually seems to have done extremely well for him. Fortunately I do not see any signs of  infection locally nor systemically at this time. 01-26-2022 upon evaluation today patient appears to be making progress. Fortunately there does not appear to be any signs of infection which is great news. No fevers, chills, nausea, vomiting, or diarrhea. With that being said this is very slow to heal and although it is smaller I still feel like we may want to try to do something to speed this up I think that a skin substitute could be beneficial,  look into Kerecis. 02-02-2022 upon evaluation today patient's wound is actually showing signs of excellent improvement. I do not see any evidence of infection and overall I think that we are headed in the right direction. Fortunately I think that he is tolerating the dressing changes without complication. 02-09-2022 upon evaluation today patient appears to be doing well currently in regard to his wound. It does look a little bit macerated but fortunately does not appear to be showing any signs of significant skin breakdown over the macerated area which is good news. Fortunately I do not see any evidence of active infection locally nor systemically at this point which is great news. 02-23-2022 upon evaluation today patient appears to be doing well currently in regard to his wounds. In fact his area on the ankle is actually showing signs of healing quite nicely. We do have the Apligraf ready today I am hopeful this is going to speed things up and get this closed much more effectively and quickly. Fortunately I do not see any signs of active infection locally nor systemically at this time which is great news. 03-02-2022 upon evaluation today patient appears to be doing excellent in regard to his wound. He is actually been tolerating the dressing changes without complication the wound on the left medial lower extremity is doing quite well and Apligraf seems to have been extremely beneficial for him. I am extremely pleased with where we stand  today. Objective Constitutional Well-nourished and well-hydrated in no acute distress. Vitals Time Taken: 2:56 PM, Height: 71 in, Weight: 350 lbs, BMI: 48.8, Temperature: 97.6 F, Pulse: 92 bpm, Respiratory Rate: 18 breaths/min, Blood Pressure: 140/87 mmHg. Respiratory [name], [name] ([id number]) [id number] Page 9 of 10 normal breathing without difficulty. Psychiatric this patient is able to make decisions and demonstrates good insight into disease process. Alert and Oriented x 3. pleasant and cooperative. General Notes: Upon inspection patient's wound bed again showed signs of excellent granulation epithelization or sharp debridement was necessary today and overall I do feel like that he is on the right track he is here today for the second application of Apligraf. Integumentary (Hair, Skin) Wound #7 status is Open. Original cause of wound was Blister. The date acquired was: 07/21/2021. The wound has been in treatment 30 weeks. The wound is located on the Right,Medial Malleolus. The wound measures 4.3cm length x 2.7cm width x 0.2cm depth; 9.118cm^2 area and 1.824cm^3 volume. There is a medium amount of serosanguineous drainage noted. The wound margin is distinct with the outline attached to the wound base. There is large (67-100%) red granulation within the wound bed. There is a small (1-33%) amount of necrotic tissue within the wound bed including Adherent Slough. The periwound skin appearance exhibited: Dry/Scaly. The periwound skin appearance did not exhibit: Callus, Crepitus, Excoriation, Induration, Rash, Scarring, Maceration, Atrophie Blanche, Cyanosis, Ecchymosis, Hemosiderin Staining, Mottled, Pallor, Rubor, Erythema. Assessment Active Problems ICD-10 Lymphedema, not elsewhere classified Chronic venous hypertension (idiopathic) with ulcer and inflammation of bilateral lower extremity Non-pressure chronic ulcer of other part of left lower leg with other  specified severity Non-pressure chronic ulcer of other part of right lower leg with other specified severity Type 2 diabetes mellitus with foot ulcer Non-pressure chronic ulcer of other part of right foot with other specified severity Procedures Wound #7 Pre-procedure diagnosis of Wound #7 is a Venous Leg Ulcer located on the Right,Medial Malleolus. A skin graft procedure using a bioengineered skin substitute/cellular or tissue based product was performed by [name], PA with  the following instrument(s): Forceps and Scissors. Apligraf was applied and secured with Steri-Strips and adaptic. 22 sq cm of product was utilized and 22 sq cm was wasted due to wound size. Post Application, no dressing was applied. A Time Out was conducted at 15:30, prior to the start of the procedure. The procedure was tolerated well with a pain level of 0 throughout and a pain level of 0 following the procedure. Post procedure Diagnosis Wound #7: Same as Pre-Procedure . Pre-procedure diagnosis of Wound #7 is a Venous Leg Ulcer located on the Right,Medial Malleolus . There was a Three Layer Compression Therapy Procedure by Deon Pilling, RN. Post procedure Diagnosis Wound #7: Same as Pre-Procedure Plan Follow-up Appointments: Return Appointment in 2 weeks. - 03/16/2022 Dr. Celine Ahr to see patient and apply apligraf Wednesday Nurse Visit: Margarita Grizzle 03/09/2022 1230pm; Wednesday Anesthetic: (In clinic) Topical Lidocaine 5% applied to wound bed Cellular or Tissue Based Products: Cellular or Tissue Based Product Type: - Run IVR for Kerrecis- denied #1 Apligraf applied 02/23/2022 #2 Apligraf applied 03/02/2022 Cellular or Tissue Based Product applied to wound bed, secured with steri-strips, cover with Adaptic or Mepitel. (DO NOT REMOVE). Bathing/ Shower/ Hygiene: May shower with protection but do not get wound dressing(s) wet. Protect dressing(s) with water repellant cover (for example, large plastic bag) or a cast  cover and may then take shower. Edema Control - Lymphedema / SCD / Other: Elevate legs to the level of the heart or above for 30 minutes daily and/or when sitting for 3-4 times a day throughout the day. Avoid standing for long periods of time. Exercise regularly Moisturize legs daily. - apply every night before bed to left leg. Compression stocking or Garment 30-40 mm/Hg pressure to: - wear your VIVE compression stocking to left leg. apply in the morning and remove at night. Will apply tubigrip size [name] to left leg. Remove once you get home and use your compression stockings. WOUND #7: - Malleolus Wound Laterality: Right, Medial Peri-Wound Care: Triamcinolone 15 (g) 1 x Per Week/30 Days Discharge Instructions: Use triamcinolone 15 (g) as directed Peri-Wound Care: Sween Lotion (Moisturizing lotion) 1 x Per Week/30 Days Discharge Instructions: Apply moisturizing lotion as directed Prim Dressing: apligraf #2 1 x Per Week/30 Days ary Discharge Instructions: applied by provider. Prim Dressing: adaptic and steri-strips 1 x Per Week/30 Days ary Discharge Instructions: secured the apligraf. Secondary Dressing: Woven Gauze Sponge, Non-Sterile 4x4 in 1 x Per Week/30 Days Discharge Instructions: Apply over primary dressing as directed. HARSHAN, JOURDAN ([id number]) [id number] Page 10 of 10 Secondary Dressing: Zetuvit Plus 4x8 in 1 x Per Week/30 Days Discharge Instructions: Apply over primary dressing as directed. Compression Wrap: ThreePress (3 layer compression wrap) 1 x Per Week/30 Days Discharge Instructions: Apply three layer use kerlix instead of cotton compression as directed. 1. I did go ahead and reapply the Apligraf today this was application #2. Subsequently I feel like that he is doing extremely well with this we will have him keep this on for the next 2 weeks and then I will subsequently see him at a 2-week time to see where things stand at that point. 2. I am  also can recommend the patient should continue with the compression wrapping. We have been doing a 3 layer compression wrap which I think is doing an awesome job. We will see patient back for reevaluation in 2 weeks here in the clinic. If anything worsens or changes patient will contact our office for additional recommendations. We will see him  in 1 week for nurse visit. Electronic Signature(s) Signed: 03/02/2022 4:18:03 PM By: [name] PA-C Entered By: [name] on 03/02/2022 16:18:03 -------------------------------------------------------------------------------- SuperBill Details Patient Name: Date of Service: [place], [name] 03/02/2022 Medical Record Number: [id number] Patient Account Number: [id number] Date of Birth/Sex: Treating RN: Jul 22, 1955 (67 y.o. M) Primary Care Provider: [name] Other Clinician: Referring Provider: Treating Provider/Extender: [name] in Treatment: 30 Diagnosis Coding ICD-10 Codes Code Description I89.0 Lymphedema, not elsewhere classified I87.333 Chronic venous hypertension (idiopathic) with ulcer and inflammation of bilateral lower extremity L97.828 Non-pressure chronic ulcer of other part of left lower leg with other specified severity L97.818 Non-pressure chronic ulcer of other part of right lower leg with other specified severity E11.621 Type 2 diabetes mellitus with foot ulcer L97.518 Non-pressure chronic ulcer of other part of right foot with other specified severity Facility Procedures : 6 CPT4 Code: [id number] Description: (Facility Use Only) Apligraf 1 SQ CM Modifier: Quantity: 44 : 3 CPT4 Code: [id number] Description: [id number] - SKIN SUB GRAFT FACE/NK/HF/G ICD-10 Diagnosis Description L97.818 Non-pressure chronic ulcer of other part of right lower leg with other specified Modifier: severity Quantity: 1 Physician Procedures : CPT4 Code Description Modifier [id number] - WC PHYS SKIN SUB GRAFT  FACE/NK/HF/G ICD-10 Diagnosis Description L97.818 Non-pressure chronic ulcer of other part of right lower leg with other specified severity Quantity: 1 Electronic Signature(s) Signed: 03/02/2022 4:19:33 PM By: [name] PA-C Entered By: [name] on 03/02/2022 16:19:33

## 2022-03-09 ENCOUNTER — Ambulatory Visit: Payer: Medicare Other | Attending: Internal Medicine | Admitting: Internal Medicine

## 2022-03-09 ENCOUNTER — Encounter: Payer: Self-pay | Admitting: Internal Medicine

## 2022-03-09 ENCOUNTER — Encounter (HOSPITAL_BASED_OUTPATIENT_CLINIC_OR_DEPARTMENT_OTHER): Payer: Medicare Other | Admitting: Physician Assistant

## 2022-03-09 VITALS — BP 140/92 | HR 80 | Ht 70.0 in | Wt 330.6 lb

## 2022-03-09 DIAGNOSIS — I87333 Chronic venous hypertension (idiopathic) with ulcer and inflammation of bilateral lower extremity: Secondary | ICD-10-CM | POA: Diagnosis not present

## 2022-03-09 DIAGNOSIS — I442 Atrioventricular block, complete: Secondary | ICD-10-CM

## 2022-03-09 DIAGNOSIS — I482 Chronic atrial fibrillation, unspecified: Secondary | ICD-10-CM | POA: Diagnosis not present

## 2022-03-09 DIAGNOSIS — R5381 Other malaise: Secondary | ICD-10-CM | POA: Diagnosis not present

## 2022-03-09 DIAGNOSIS — Z6841 Body Mass Index (BMI) 40.0 and over, adult: Secondary | ICD-10-CM | POA: Diagnosis not present

## 2022-03-09 DIAGNOSIS — G4733 Obstructive sleep apnea (adult) (pediatric): Secondary | ICD-10-CM | POA: Diagnosis not present

## 2022-03-09 DIAGNOSIS — Z950 Presence of cardiac pacemaker: Secondary | ICD-10-CM | POA: Diagnosis not present

## 2022-03-09 DIAGNOSIS — Z8572 Personal history of non-Hodgkin lymphomas: Secondary | ICD-10-CM | POA: Diagnosis not present

## 2022-03-09 DIAGNOSIS — J449 Chronic obstructive pulmonary disease, unspecified: Secondary | ICD-10-CM | POA: Diagnosis not present

## 2022-03-09 DIAGNOSIS — Z95810 Presence of automatic (implantable) cardiac defibrillator: Secondary | ICD-10-CM | POA: Diagnosis not present

## 2022-03-09 DIAGNOSIS — L97818 Non-pressure chronic ulcer of other part of right lower leg with other specified severity: Secondary | ICD-10-CM | POA: Diagnosis not present

## 2022-03-09 DIAGNOSIS — I5022 Chronic systolic (congestive) heart failure: Secondary | ICD-10-CM

## 2022-03-09 DIAGNOSIS — E11621 Type 2 diabetes mellitus with foot ulcer: Secondary | ICD-10-CM | POA: Diagnosis not present

## 2022-03-09 DIAGNOSIS — Z89432 Acquired absence of left foot: Secondary | ICD-10-CM | POA: Diagnosis not present

## 2022-03-09 DIAGNOSIS — Z7901 Long term (current) use of anticoagulants: Secondary | ICD-10-CM | POA: Diagnosis not present

## 2022-03-09 DIAGNOSIS — L97518 Non-pressure chronic ulcer of other part of right foot with other specified severity: Secondary | ICD-10-CM | POA: Diagnosis not present

## 2022-03-09 DIAGNOSIS — L97828 Non-pressure chronic ulcer of other part of left lower leg with other specified severity: Secondary | ICD-10-CM | POA: Diagnosis not present

## 2022-03-09 DIAGNOSIS — E1151 Type 2 diabetes mellitus with diabetic peripheral angiopathy without gangrene: Secondary | ICD-10-CM | POA: Diagnosis not present

## 2022-03-09 DIAGNOSIS — L089 Local infection of the skin and subcutaneous tissue, unspecified: Secondary | ICD-10-CM | POA: Diagnosis not present

## 2022-03-09 DIAGNOSIS — T148XXA Other injury of unspecified body region, initial encounter: Secondary | ICD-10-CM | POA: Diagnosis not present

## 2022-03-09 DIAGNOSIS — I4891 Unspecified atrial fibrillation: Secondary | ICD-10-CM | POA: Diagnosis not present

## 2022-03-09 DIAGNOSIS — I890 Lymphedema, not elsewhere classified: Secondary | ICD-10-CM | POA: Diagnosis not present

## 2022-03-09 NOTE — Progress Notes (Signed)
HPI Mr. Plucinski returns today for ICD followup. He is a pleasant 67 yo man with a h/o chronic systolic heart failure, s/p ICD insertion. He has a h/o chemo induced CM, chronic atrial fib and sleep apnea. He underwent biv ICD insertion several months ago with a left bundle lead placed in the atrial port and a standard ICD lead placed in the RV apical septum. He has a h/o longstanding atrial fib. His QRS duration pre ICD was 160 and today measures 82 ms but I think is closer to 110 ms. His post procedure EF did not improved but his wife thinks that he is much better. He has chronic peripheral edema. Allergies  Allergen Reactions   Latex Hives and Swelling   Ace Inhibitors     Other reaction(s): cough   Penicillin G     Other reaction(s): rash Has tolerated amoxicillin     Current Outpatient Medications  Medication Sig Dispense Refill   acetaminophen (TYLENOL) 325 MG tablet Take 650 mg by mouth every 6 (six) hours as needed for moderate pain.      ascorbic acid (VITAMIN C) 500 MG tablet Take 1 tablet (500 mg total) by mouth 3 (three) times daily. (Patient taking differently: Take 500 mg by mouth 2 (two) times daily.) 30 tablet 0   aspirin 81 MG chewable tablet Chew 1 tablet (81 mg total) by mouth daily. 30 tablet 11   atorvastatin (LIPITOR) 40 MG tablet TAKE 1 TABLET BY MOUTH EVERY DAY 30 tablet 11   carvedilol (COREG) 3.125 MG tablet Take 1 tablet (3.125 mg total) by mouth 2 (two) times daily. 180 tablet 3   cholecalciferol (VITAMIN D) 25 MCG (1000 UNIT) tablet Take 1 tablet (1,000 Units total) by mouth daily. 30 tablet 0   colchicine 0.6 MG tablet Take 0.6 mg by mouth 2 (two) times daily as needed.     Continuous Blood Gluc Receiver (FREESTYLE LIBRE 2 READER) DEVI 1 each by Does not apply route daily. 1 each 3   Continuous Blood Gluc Sensor (FREESTYLE LIBRE 2 SENSOR) MISC 1 each by Does not apply route daily. 1 each 3   dapagliflozin propanediol (FARXIGA) 10 MG TABS tablet Take 1 tablet  (10 mg total) by mouth daily. 30 tablet 11   ELIQUIS 5 MG TABS tablet TAKE 1 TABLET BY MOUTH TWICE A DAY 180 tablet 1   KLOR-CON M20 20 MEQ tablet Take 20 mEq by mouth daily.     Omega 3 340 MG CPDR Take 1 capsule (340 mg total) by mouth 2 (two) times daily. (Patient taking differently: Take 340 mg by mouth daily.) 60 capsule 0   omeprazole (PRILOSEC) 40 MG capsule Take 1 capsule (40 mg total) by mouth daily. 30 capsule 0   polycarbophil (FIBERCON) 625 MG tablet Take 1 tablet (625 mg total) by mouth daily. 30 tablet 0   sacubitril-valsartan (ENTRESTO) 97-103 MG Take 1 tablet by mouth 2 (two) times daily. 60 tablet 11   sertraline (ZOLOFT) 50 MG tablet Take 1 tablet (50 mg total) by mouth daily. 30 tablet 0   spironolactone (ALDACTONE) 25 MG tablet Take 1 tablet (25 mg total) by mouth daily. 30 tablet 11   Tirzepatide (MOUNJARO [place]) Inject 1 Dose into the skin once a week.     torsemide (DEMADEX) 20 MG tablet Take 1 tablet (20 mg total) by mouth as needed. 30 tablet 6   vitamin B-12 (CYANOCOBALAMIN) 1000 MCG tablet Take 1 tablet (1,000 mcg total) by  mouth daily. 30 tablet 0   No current facility-administered medications for this visit.     Past Medical History:  Diagnosis Date   Acute systolic HF (heart failure) (HCC)    Arthritis    lt foot, hips knees    Atrial fibrillation ([place])    Diabetes mellitus, new onset ([place])    Diverticulosis    Dyslipidemia    Gout attack 01/16/2012   Hypertension    Internal hemorrhoids    Lymphoma ([place])    remission for about 2 years, chemo 3 years prior   Pneumonia due to COVID-19 virus 07/2018   Pulmonary hypertension ([place])    Sepsis ([place]) 02/2017   secondary to influenza; requiring trach   Tubular adenoma of colon    Venous stasis ulcers ([place])     ROS:   All systems reviewed and negative except as noted in the HPI.   Past Surgical History:  Procedure Laterality Date   ABDOMINAL AORTOGRAM W/LOWER EXTREMITY N/A 08/14/2020   Procedure:  ABDOMINAL AORTOGRAM W/LOWER EXTREMITY;  Surgeon: Elam Dutch, MD;  Location: [facility];  Service: Cardiovascular;  Laterality: N/A;   AMPUTATION TOE Left 05/08/2019   Procedure: AMPUTATION LEFT GREAT  TOE;  Surgeon: Trula Slade, DPM;  Location: [facility];  Service: Podiatry;  Laterality: Left;   ARTERIAL LINE INSERTION Right 08/26/2021   Procedure: ARTERIAL LINE INSERTION;  Surgeon: Early Osmond, MD;  Location: [facility];  Service: Cardiovascular;  Laterality: Right;  rt radial   BIV ICD INSERTION CRT-D N/A 09/01/2021   Procedure: BIV ICD INSERTION CRT-D;  Surgeon: Evans Lance, MD;  Location: [facility];  Service: Cardiovascular;  Laterality: N/A;   BRONCHOSCOPY     ESOPHAGOGASTRODUODENOSCOPY ENDOSCOPY     Dr Royden Purl HERNIA REPAIR Left    2001   IR REMOVAL TUN ACCESS W/ PORT W/O FL MOD SED  02/03/2020   PORTA CATH INSERTION  2011   RIGHT/LEFT HEART CATH AND CORONARY ANGIOGRAPHY N/A 08/27/2021   Procedure: RIGHT/LEFT HEART CATH AND CORONARY ANGIOGRAPHY;  Surgeon: Jolaine Artist, MD;  Location: [facility];  Service: Cardiovascular;  Laterality: N/A;   TEMPORARY PACEMAKER N/A 08/26/2021   Procedure: TEMPORARY PACEMAKER;  Surgeon: Early Osmond, MD;  Location: [facility];  Service: Cardiovascular;  Laterality: N/A;   TRACHEOSTOMY  03/2017   with decannulation   TRANSMETATARSAL AMPUTATION Left 08/05/2020   Procedure: TRANSMETATARSAL AMPUTATION;  Surgeon: Trula Slade, DPM;  Location: [facility];  Service: Podiatry;  Laterality: Left;     Family History  Problem Relation Age of Onset   Dementia Brother 97       frontal lobe    Dementia Mother    Arthritis Mother    Hypertension Mother    CVA Sister 85   Colon cancer Neg Hx    Pancreatic cancer Neg Hx    Stomach cancer Neg Hx    Rectal cancer Neg Hx    Liver cancer Neg Hx      Social History   Socioeconomic History   Marital status: Divorced    Spouse name:  Not on file   Number of children: Not on file   Years of education: Not on file   Highest education level: Not on file  Occupational History   Occupation: cosmetologist  Tobacco Use   Smoking status: Former    Packs/day: 0.50    Years: 20.00    Total pack  years: 10.00    Types: Cigarettes    Quit date: 01/10/2009    Years since quitting: 13.1   Smokeless tobacco: Never  Vaping Use   Vaping Use: Never used  Substance and Sexual Activity   Alcohol use: Not Currently    Comment: 2-3 times per week   Drug use: No   Sexual activity: Not on file  Other Topics Concern   Not on file  Social History Narrative   Not on file   Social Determinants of Health   Financial Resource Strain: Not on file  Food Insecurity: No Food Insecurity (09/20/2021)   Hunger Vital Sign    Worried About Running Out of Food in the Last Year: Never true    Ran Out of Food in the Last Year: Never true  Transportation Needs: No Transportation Needs (09/20/2021)   PRAPARE - Hydrologist (Medical): No    Lack of Transportation (Non-Medical): No  Physical Activity: Not on file  Stress: Not on file  Social Connections: Not on file  Intimate Partner Violence: Not on file     BP (!) 140/92   Pulse 80   Ht '5\' 10"'$  (1.778 m)   Wt (!) 330 lb 9.6 oz (150 kg)   SpO2 96%   BMI 47.44 kg/m   Physical Exam:  Obese appearing NAD HEENT: Unremarkable Neck:  No JVD, no thyromegally Lymphatics:  No adenopathy Back:  No CVA tenderness Lungs:  Clear with no wheezes HEART:  Regular rate rhythm, no murmurs, no rubs, no clicks Abd:  soft, positive bowel sounds, no organomegally, no rebound, no guarding Ext:  2 plus pulses, no edema, no cyanosis, no clubbing Skin:  No rashes no nodules Neuro:  CN II through XII intact, motor grossly intact  EKG - atrial fib with ventricular pacing  DEVICE  Normal device function.  See PaceArt for details.   Assess/Plan: Chronic systolic heart failure  - his symptoms are class 2. He will continue with GDMT.  Atrial fib - he is mostly paced with a LBB area lead and a well controlled, paced vr. Obesity - he is encouraged to avoid salty foods and to lose weight. ICD - his Medtronic ICD is working normally.  Carleene Overlie Aiesha Leland,MD

## 2022-03-09 NOTE — Patient Instructions (Signed)
Medication Instructions:  Your physician recommends that you continue on your current medications as directed. Please refer to the Current Medication list given to you today.  *If you need a refill on your cardiac medications before your next appointment, please call your pharmacy*  Lab Work: None ordered.  If you have labs (blood work) drawn today and your tests are completely normal, you will receive your results only by: [place] (if you have MyChart) OR A paper copy in the mail If you have any lab test that is abnormal or we need to change your treatment, we will call you to review the results.  Testing/Procedures: None ordered.  Follow-Up: At [facility], you and your health needs are our priority.  As part of our continuing mission to provide you with exceptional heart care, we have created designated Provider Care Teams.  These Care Teams include your primary Cardiologist (physician) and Advanced Practice Providers (APPs -  Physician Assistants and Nurse Practitioners) who all work together to provide you with the care you need, when you need it.  We recommend signing up for the patient portal called "MyChart".  Sign up information is provided on this After Visit Summary.  MyChart is used to connect with patients for Virtual Visits (Telemedicine).  Patients are able to view lab/test results, encounter notes, upcoming appointments, etc.  Non-urgent messages can be sent to your provider as well.   To learn more about what you can do with MyChart, go to [email or web address].    Your next appointment:   1 year(s)  The format for your next appointment:   In Person  Provider:   Cristopher Peru, MD{or one of the following Advanced Practice Providers on your designated Care Team:   Tommye Standard, [place] Legrand Como "Jonni Sanger" Chalmers Cater, [place]  Remote monitoring is used to monitor your ICD from home. This monitoring reduces the number of office visits required to check your device to one  time per year. It allows [place] to keep an eye on the functioning of your device to ensure it is working properly. You are scheduled for a device check from home on 03/21/22. You may send your transmission at any time that day. If you have a wireless device, the transmission will be sent automatically. After your physician reviews your transmission, you will receive a postcard with your next transmission date.

## 2022-03-10 NOTE — Progress Notes (Signed)
TRAQUAN, PILLADO ([id number]) [id number] Page 1 of 1 Visit Report for 03/09/2022 SuperBill Details Patient Name: Date of Service: [name], [name] 03/09/2022 Medical Record Number: [id number] Patient Account Number: [id number] Date of Birth/Sex: Treating RN: 1955-03-14 (67 y.o. M) Primary Care Provider: [name] Other Clinician: Referring Provider: Treating Provider/Extender: [name] in Treatment: 31 Diagnosis Coding ICD-10 Codes Code Description I89.0 Lymphedema, not elsewhere classified I87.333 Chronic venous hypertension (idiopathic) with ulcer and inflammation of bilateral lower extremity L97.828 Non-pressure chronic ulcer of other part of left lower leg with other specified severity L97.818 Non-pressure chronic ulcer of other part of right lower leg with other specified severity E11.621 Type 2 diabetes mellitus with foot ulcer L97.518 Non-pressure chronic ulcer of other part of right foot with other specified severity Facility Procedures CPT4 Code Description Modifier Quantity [id number] (Facility Use Only) [phone] - APPLY MULTLAY COMPRS LWR RT LEG 1 Electronic Signature(s) Signed: 03/09/2022 3:49:00 PM By: Erenest Blank Signed: 03/09/2022 3:56:54 PM By: [name] PA-C Entered By: Erenest Blank on 03/09/2022 13:17:11

## 2022-03-10 NOTE — Progress Notes (Signed)
KINDLE, CARVER ([id number]) [id number] Page 1 of 3 Visit Report for 03/09/2022 Arrival Information Details Patient Name: Date of Service: [name], [name] 03/09/2022 12:30 PM Medical Record Number: [id number] Patient Account Number: [id number] Date of Birth/Sex: Treating RN: July 03, 1955 (67 y.o. M) Primary Care [name]: [name] Other Clinician: Referring James Lafalce: Treating Alta Shober/Extender: [name] in Treatment: 53 Visit Information History Since Last Visit Added or deleted any medications: No Patient Arrived: [name] Any new allergies or adverse reactions: No Arrival Time: 13:14 Had a fall or experienced change in No Accompanied By: self activities of daily living that may affect Transfer Assistance: None risk of falls: Patient Requires Transmission-Based Precautions: No Signs or symptoms of abuse/neglect since last visito No Patient Has Alerts: Yes Hospitalized since last visit: No Patient Alerts: Patient on Blood Thinner Implantable device outside of the clinic excluding No 01/2021 ABI L 1.13 R 1.06 cellular tissue based products placed in the center 01/2021 TBI L amp R 0.8 since last visit: Has Dressing in Place as Prescribed: Yes Has Compression in Place as Prescribed: Yes Pain Present Now: No Electronic Signature(s) Signed: 03/09/2022 3:49:00 PM By: Erenest Blank Entered By: Erenest Blank on 03/09/2022 13:15:35 -------------------------------------------------------------------------------- Compression Therapy Details Patient Name: Date of Service: [name] Gloss [name]. 03/09/2022 12:30 PM Medical Record Number: [id number] Patient Account Number: [id number] Date of Birth/Sex: Treating RN: 02-20-1955 (67 y.o. M) Primary Care Al Gagen: [name] Other Clinician: Referring [name]: Treating [name]/Extender: [name] in Treatment: 31 Compression Therapy Performed for Wound Assessment:  Wound #7 Right,Medial Malleolus Performed By: Clinician Erenest Blank, Compression Type: Three Layer Electronic Signature(s) Signed: 03/09/2022 3:49:00 PM By: Erenest Blank Entered By: Erenest Blank on 03/09/2022 13:16:17 -------------------------------------------------------------------------------- Encounter Discharge Information Details Patient Name: Date of Service: [name], [name] [name] 03/09/2022 12:30 PM Medical Record Number: [id number] Patient Account Number: [id number] Date of Birth/Sex: Treating RN: 06-18-1955 (67 y.o. M) Primary Care Morell Mears: [name] Other Clinician: Referring [name]: Treating [name]/Extender: [name] in Treatment: 31 Encounter Discharge Information Items Discharge Condition: Stable Ambulatory Status: Walker Discharge Destination: Home Transportation: Other Accompanied By: self Schedule Follow-up Appointment: Yes Clinical Summary of CarePRANISH, PINKS [name] ([id number]) [id number] Page 2 of 3 Electronic Signature(s) Signed: 03/09/2022 3:49:00 PM By: Erenest Blank Entered By: Erenest Blank on 03/09/2022 13:16:53 -------------------------------------------------------------------------------- Patient/Caregiver Education Details Patient Name: Date of Service: [name] 2/28/2024andnbsp12:30 PM Medical Record Number: [id number] Patient Account Number: [id number] Date of Birth/Gender: Treating RN: Sep 09, 1955 (67 y.o. M) Primary Care Physician: [name] Other Clinician: Erenest Blank Referring Physician: Treating Physician/Extender: [name] in Treatment: 31 Education Assessment Education Provided To: Patient Education Topics Provided Electronic Signature(s) Signed: 03/09/2022 3:49:00 PM By: Erenest Blank Entered By: Erenest Blank on 03/09/2022 13:16:39 -------------------------------------------------------------------------------- Wound Assessment  Details Patient Name: Date of Service: [name], [name] [name]. 03/09/2022 12:30 PM Medical Record Number: [id number] Patient Account Number: [id number] Date of Birth/Sex: Treating RN: 05-07-1955 (67 y.o. M) Primary Care Taleshia Luff: [name] Other Clinician: Referring [name]: Treating [name]/Extender: [name] in Treatment: 31 Wound Status Wound Number: 7 Primary Etiology: Venous Leg Ulcer Wound Location: Right, Medial Malleolus Secondary Etiology: Lymphedema Wounding Event: Blister Wound Status: Open Date Acquired: 07/21/2021 Weeks Of Treatment: 31 Clustered Wound: No Wound Measurements Length: (cm) 4.3 Width: (cm) 2.7 Depth: (cm) 0.2 Area: (cm) 9.118 Volume: (cm) 1.824 % Reduction in Area: 76.2% % Reduction in Volume: 93.2% Wound Description Classification: Full  Thickness With Exposed Support S Exudate Amount: Medium Exudate Type: Serosanguineous Exudate Color: red, brown tructures Periwound Skin Texture Texture Color No Abnormalities Noted: No No Abnormalities Noted: No Moisture No Abnormalities Noted: No Treatment Notes Wound #7 (Malleolus) Wound Laterality: Right, Medial [name] ([id number] Page 3 of 3 Cleanser Peri-Wound Care Triamcinolone 15 (g) Discharge Instruction: Use triamcinolone 15 (g) as directed Sween Lotion (Moisturizing lotion) Discharge Instruction: Apply moisturizing lotion as directed Topical Primary Dressing apligraf #2 Discharge Instruction: applied by [name]. adaptic and steri-strips Discharge Instruction: secured the apligraf. Secondary Dressing Woven Gauze Sponge, Non-Sterile 4x4 in Discharge Instruction: Apply over primary dressing as directed. Zetuvit Plus 4x8 in Discharge Instruction: Apply over primary dressing as directed. Secured With Compression Wrap ThreePress (3 layer compression wrap) Discharge Instruction: Apply three layer use kerlix instead of  cotton compression as directed. Compression Stockings Add-Ons Electronic Signature(s) Signed: 03/09/2022 3:49:00 PM By: Erenest Blank Entered By: Erenest Blank on 03/09/2022 13:15:56 -------------------------------------------------------------------------------- Vitals Details Patient Name: Date of Service: [name] [name] 03/09/2022 12:30 PM Medical Record Number: [id number] Patient Account Number: [id number] Date of Birth/Sex: Treating RN: 1955-11-04 (67 y.o. M) Primary Care Isiaha [place]: [name] Other Clinician: Referring Enzo Treu: Treating Elky Funches/Extender: [name] in Treatment: 31 Vital Signs Time Taken: 12:35 Reference Range: 80 - 120 mg / dl Height (in): 71 Weight (lbs): 350 Body Mass Index (BMI): 48.8 Electronic Signature(s) Signed: 03/09/2022 3:49:00 PM By: Erenest Blank Entered By: Erenest Blank on 03/09/2022 13:15:46

## 2022-03-11 NOTE — Telephone Encounter (Signed)
error 

## 2022-03-15 ENCOUNTER — Telehealth: Payer: Self-pay | Admitting: Pulmonary Disease

## 2022-03-15 NOTE — Telephone Encounter (Signed)
Needs his portable oxygen and oxygen tank picked up

## 2022-03-16 ENCOUNTER — Encounter (HOSPITAL_BASED_OUTPATIENT_CLINIC_OR_DEPARTMENT_OTHER): Payer: Medicare Other | Attending: Physician Assistant | Admitting: General Surgery

## 2022-03-16 DIAGNOSIS — I890 Lymphedema, not elsewhere classified: Secondary | ICD-10-CM | POA: Diagnosis not present

## 2022-03-16 DIAGNOSIS — E11621 Type 2 diabetes mellitus with foot ulcer: Secondary | ICD-10-CM | POA: Diagnosis not present

## 2022-03-16 DIAGNOSIS — L97518 Non-pressure chronic ulcer of other part of right foot with other specified severity: Secondary | ICD-10-CM | POA: Insufficient documentation

## 2022-03-16 DIAGNOSIS — I87333 Chronic venous hypertension (idiopathic) with ulcer and inflammation of bilateral lower extremity: Secondary | ICD-10-CM | POA: Diagnosis not present

## 2022-03-16 DIAGNOSIS — L97828 Non-pressure chronic ulcer of other part of left lower leg with other specified severity: Secondary | ICD-10-CM | POA: Diagnosis not present

## 2022-03-16 DIAGNOSIS — I872 Venous insufficiency (chronic) (peripheral): Secondary | ICD-10-CM | POA: Diagnosis not present

## 2022-03-16 DIAGNOSIS — L97818 Non-pressure chronic ulcer of other part of right lower leg with other specified severity: Secondary | ICD-10-CM | POA: Diagnosis not present

## 2022-03-16 DIAGNOSIS — I87331 Chronic venous hypertension (idiopathic) with ulcer and inflammation of right lower extremity: Secondary | ICD-10-CM | POA: Diagnosis not present

## 2022-03-16 DIAGNOSIS — L97312 Non-pressure chronic ulcer of right ankle with fat layer exposed: Secondary | ICD-10-CM | POA: Diagnosis not present

## 2022-03-16 NOTE — Telephone Encounter (Signed)
I would advise a walk test before we can discontinue his oxygen.  We can do that at his return clinic visit on 04/18/2022.

## 2022-03-16 NOTE — Telephone Encounter (Signed)
Spoke with patient. He advises he has not used o2 in months and states he doesn't feel like he needs it. He has been monitoring oxygen and it hasn't dropped below 97%. Dr. Vaughan Browner please advise if your okay with D/C his o2 and poc

## 2022-03-16 NOTE — Telephone Encounter (Signed)
Attempted to call pt but unable to reach. Left pt a detailed message of info from Dr. Vaughan Browner. Nothing further needed.

## 2022-03-18 NOTE — Progress Notes (Signed)
[name], [name] ([id number]) [id number] Page 1 of 13 Visit Report for 03/16/2022 Chief Complaint Document Details Patient Name: Date of Service: [name], [name] 03/16/2022 3:00 PM Medical Record Number: [id number] Patient Account Number: [id number] Date of Birth/Sex: Treating RN: [name] (67 y.o. M) Primary Care Provider: Marton [name] Other Clinician: Referring Provider: Treating Provider/Extender: [name] in Treatment: 32 Information Obtained from: Patient Chief Complaint 08/04/2021; patient returns to clinic with bilateral leg wounds as well as areas on the right foot Electronic Signature(s) Signed: 03/16/2022 5:32:42 PM By: [name] Maudlin MD FACS Entered By: [name] on 03/16/2022 17:32:42 -------------------------------------------------------------------------------- Cellular or Tissue Based Product Details Patient Name: Date of Service: [name], [name]. 03/16/2022 3:00 PM Medical Record Number: [id number] Patient Account Number: [id number] Date of Birth/Sex: Treating RN: [name] (68 y.o. [name] Primary Care Provider: Marton [name] Other Clinician: Referring Provider: Treating Provider/Extender: Estelle Grumbles in Treatment: 32 Cellular or Tissue Based Product Type Wound #7 Right,Medial Malleolus Applied to: Performed By: Physician [name] Maudlin, MD Cellular or Tissue Based Product Type: Apligraf Level of Consciousness (Pre-procedure): Awake and Alert Pre-procedure Verification/Time Out Yes - 15:28 Taken: Location: genitalia / hands / feet / multiple digits Wound Size (sq cm): 7.56 Product Size (sq cm): 44 Waste Size (sq cm): 27 Waste Reason: wound size Amount of Product Applied (sq cm): 17 Instrument Used: Forceps, Scissors Lot #: [id number] Order #: 3 Expiration Date: 03/18/2022 Fenestrated: Yes Instrument: Blade Reconstituted: Yes Solution Type: normal  saline Solution Amount: 61m Lot #:[id number] Expiration Date: 06/09/2024 Secured: Yes Secured With: Steri-Strips, adaptic Dressing Applied: No Procedural Pain: 0 Post Procedural Pain: 0 Response to Treatment: Procedure was tolerated well Level of Consciousness (Post- Awake and Alert procedure): Post Procedure Diagnosis Same as Pre-procedure Electronic Signature(s) Signed: 03/16/2022 5:31:37 PM By: [name] PillingRN, BSN HMidway DKasandra [name] ([id number] Page 2 of 13 Signed: 03/17/2022 7:44:12 AM By: [name] Previous Signature: 03/16/2022 4:27:04 PM Version By: [name] Previous Signature: 03/16/2022 5:05:26 PM Version By: [name] PillingRN, BSN Entered By: [name] Pillingon 03/16/2022 17:27:58 -------------------------------------------------------------------------------- Debridement Details Patient Name: Date of Service: [name], [name] 03/16/2022 3:00 PM Medical Record Number: [id number] Account Number: [id number] of Birth/Sex: Treating RN: [birth year](67y.o. MLorette Ang BMeta.RedingPrimary Care Provider: SMarton RedwoodOther Clinician: Referring Provider: Treating Provider/Extender: REstelle Grumblesin Treatment: 32 Debridement Performed for Assessment: Wound #7 Right,Medial Malleolus Performed By: Physician CFredirick Maudlin MD Debridement Type: Debridement Severity of Tissue Pre Debridement: Fat layer exposed Level of Consciousness (Pre-procedure): Awake and Alert Pre-procedure Verification/Time Out Yes - 15:20 Taken: Start Time: 15:21 Pain Control: Lidocaine 4% T opical Solution T Area Debrided (L x W): otal 4.5 (cm) x 2 (cm) = 9 (cm) Tissue and other material debrided: Viable, Non-Viable, Slough, Subcutaneous, Skin: Dermis , Skin: Epidermis, Slough Level: Skin/Subcutaneous Tissue Debridement Description: Excisional Instrument: Curette Bleeding: Minimum Hemostasis Achieved: Pressure End  Time: 15:26 Procedural Pain: 0 Post Procedural Pain: 0 Response to Treatment: Procedure was tolerated well Level of Consciousness (Post- Awake and Alert procedure): Post Debridement Measurements of Total Wound Length: (cm) 4.2 Width: (cm) 1.8 Depth: (cm) 0.2 Volume: (cm) 1.188 Character of Wound/Ulcer Post Debridement: Improved Severity of Tissue Post Debridement: Fat layer exposed Post Procedure Diagnosis Same as Pre-procedure Electronic Signature(s) Signed: 03/16/2022 5:31:37 PM By: [name] PillingRN, BSN Signed: 03/17/2022 7:44:12 AM By: [name] Previous Signature: 03/16/2022 4:27:04 PM Version By: RDellia Nims  Legrand Como MD Previous Signature: 03/16/2022 5:05:26 PM Version By: Deon Pilling RN, BSN Entered By: Deon [name] on 03/16/2022 17:27:30 -------------------------------------------------------------------------------- HPI Details Patient Name: Date of Service: [name], [name] 03/16/2022 3:00 PM Medical Record Number: [id number] Patient Account Number: [id number] Date of Birth/Sex: Treating RN: [name] (67 y.o. M) Primary Care Provider: Marton [name] Other Clinician: Referring Provider: Treating Provider/Extender: [name] in Treatment: 3 History of Present Illness HPI Description: ADMISSION 03/22/2021 This is a 67 year old man with a past medical history significant for diabetes type 2, congestive heart failure, peripheral arterial disease, morbid obesity, venous insufficiency, and coronary artery disease. He has been followed by Dr. Earleen [name] in podiatry, who performed a transmetatarsal amputation on the left [name] ([id number]) [phone] Page 3 of 13 foot in August 2022. He had issues healing that wound, but based upon [name] notes, ultimately the TMA wound healed. During his recovery from that surgery, however, ulcers opened up over the DIP joint of the right second and third toe. These have apparently  closed and reopened multiple times. It sounds like one of the issues has been moisture accumulation and maceration of the tissues causing them to reopen. At his last visit with Dr. Earleen [name], on March 01, 2021, there continues to be problems with moisture and he was referred to wound care for further evaluation and management. He had a formal aortogram with runoff performed prior to his TMA. The findings are copied here: Patient has inline flow to both feet with no significant flow-limiting lesion that would be amenable to percutaneous or open revascularization. He does have an element of small vessel disease and has a short segment occlusion of the distal anterior tibial/dorsalis pedis artery on the left foot but does have posterior tibial artery flow. Would recommend management of wounds with amputation of toes 2 and 3 on the right foot if the wounds do not heal and deteriorate. Transmetatarsal amputation on the left side has as good a blood supply as it is going to get and hopefully this will heal in the future. Formal ABIs were done in January 2023. They are normal bilaterally. ABI Findings: +---------+------------------+-----+----------+--------+ Right Rt Pressure (mmHg)IndexWaveform Comment  +---------+------------------+-----+----------+--------+ Brachial 160     +---------+------------------+-----+----------+--------+ PTA 192 1.06 monophasic  +---------+------------------+-----+----------+--------+ DP 159 0.88 monophasic  +---------+------------------+-----+----------+--------+ Great T oe145 0.80    +---------+------------------+-----+----------+--------+ +--------+------------------+-----+---------+-------+ Left Lt Pressure (mmHg)IndexWaveform Comment +--------+------------------+-----+---------+-------+ [id number]     +--------+------------------+-----+---------+-------+ PTA 204 1.13 triphasic   +--------+------------------+-----+---------+-------+ DP 194 1.07 biphasic   +--------+------------------+-----+---------+-------+ +-------+-----------+-----------+------------+------------+ ABI/TBIT oday's ABIT oday's TBIPrevious ABIPrevious TBI +-------+-----------+-----------+------------+------------+ Right 1.06 0.8 1.26 0.65  +-------+-----------+-----------+------------+------------+ Left 1.13 amputation 1.15 amputation  +-------+-----------+-----------+------------+------------+ Previous ABI on 08/06/20 at [facility] Pedal pressures falsely elevated due to medial calcification. Summary: Right: Resting right ankle-brachial index is within normal range. The right toe-brachial index is normal. Left: Resting left ankle-brachial index is within normal range. READMISSION 08/04/2021 Mr. Chapnick is now a 67 year old man who I remember from this clinic many years ago I think he had a right lower extremity predominantly venous wound at the time. He was here for 1 visit in March of this year had wounds on his right second and third toes we apparently dressed them many and they healed so he did not come back. He is listed in [place] is being a diabetic although the patient denies this says he is verified it with his primary doctor. In any case over the last several weeks or so according the patient although these wounds  look somewhat more chronic than that he has developed predominantly large wounds on the right medial and right lateral ankle smaller areas on the left leg and areas on the dorsal aspect of his right second and third toes. Its not clear how he has been dressing these. 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 The patient has known PAD. He had an angiogram in August 2022 at which time he had nonhealing wounds in both feet. On the left his major vessels in the thigh were all  patent. He had three-vessel patent to the level of the ankle. He had a very short occlusion in the left anterior tibial. On the right lower extremity the major vessels in his thigh were all patent. He had three-vessel runoff to the foot sluggish filling of the anterior tibial artery. He was felt to have some component of small vessel disease but nothing that was amenable or needed revascularization. It was recommended that he have amputation of the second and third toes on the right foot if they did not heal He has been following with Dr. Earleen [name] of podiatry. Dr. Earleen [name] got him juxta lite stockings although I do not think he had them on properly he has uncontrolled edema in both legs Past medical history includes type 2 diabetes [although the patient really denies this], left TMA in 2022,lower extremity wounds in fact attendance at this clinic in 2009-2010, A-fib on Eliquis, chronic venous insufficiency with secondary lymphedema history of non-Hodgkin's lymphoma. 08-11-2021 upon evaluation today patient presents for follow-up evaluation he was seen last Wednesday initially for inspection here in our clinic. With that being said he tells me that he unfortunately has been having a lot of drainage and is actually coming through his wrap. Fortunately I do not see any evidence of active infection locally or systemically at this time which is great news. No fevers, chills, nausea, vomiting, or diarrhea. With that being said there does appear to be some evidence of local infection based on what I am seeing today. 08-18-2021 upon evaluation today patient appears to be doing okay currently in regard to his wounds with that being said that he is doing much better but still has a long ways to go to get to where he wanted to be. I think the infection is significantly improved. He has another week of the antibiotic at this point. 08-25-2021 upon evaluation today patient's wounds are actually doing decently well he has  erythema has significantly improved. I think the cellulitis is under controlled I am going to continue him on 2 more weeks of the Levaquin at 500 mg this is a lower dose but I am hoping it will be better for him. 09-08-2021 upon evaluation today patient appears to be doing excellent in regard to his wounds. Since I last saw him he was actually in the hospital where he [place] [place] [name] ([id number]) [id number] Page 4 of 13 ended up having a pacemaker put in. Subsequently he tells me that he is actually doing quite well although they were unsure whether they were going to do it due to the fact that he had the wounds on his legs. And then I am glad they did anything seem to be doing well. 09-15-2021 upon evaluation patient's wounds are actually showing signs of improvement. The right side wounds do appear to have some need for sharp debridement today and I Georgina Peer go ahead and proceed with that.  I think that if we get the wounds cleaned up he will actually show signs of continued improvement. I am also leaning towards switching to [facility] which I think will be a much better option for him. 09-22-2021 patient's wounds are showing signs of excellent improvement. I am actually extremely pleased with where we stand and I think that the patient is making great progress. There does not appear to be any signs of active infection. 09-29-2021 upon evaluation today patient appears to be doing excellent in regard to his wounds. He is actually tolerating the dressing changes without complication. Fortunately I see no evidence of active infection locally or systemically at this time which is great news and overall I am extremely pleased with where we stand currently. 09-2721 upon evaluation today patient actually appears to be doing excellent in regard to his wounds. The left leg is almost completely healed the right ankle is significantly smaller. Overall I am extremely pleased with where we  stand at this point. I do not see any evidence of active infection at this time. 10-13-2021 upon evaluation today patient appears to be doing excellent in regard to his wounds. I really feel like he is making good progress here and I am very pleased in that regard. Fortunately I do not see any signs of active infection at this time. We are using the [facility] topical antibiotic therapy. 10-20-2021 upon evaluation today patient appears to be doing well with regard to his wound on the right medial ankle region the left leg is almost completely healed. I am actually very pleased with where we stand today. 11-03-2021 upon evaluation today patient's wound actually is going require some sharp debridement but appears to be doing much better which is great news. Fortunately I do not see any signs of active infection at this time. 11-10-2021 upon evaluation today patient's wound is actually showing signs of excellent improvement. Fortunately I do not see any evidence of infection locally or systemically which is great news and overall I am extremely pleased with where we stand today. I do believe he is making good progress he does have his [facility] topical antibiotics with him here today. 11-24-2021 upon evaluation today patient actually showing signs of excellent improvement this appears to be doing much better. Fortunately I do not see any evidence of infection locally or systemically at this time. 12-01-2021 upon evaluation today patient appears to be doing well currently in regard to his wound. He has been tolerating the dressing changes without complication. Fortunately I do not see any evidence of active infection at this time which is great news and overall I am extremely pleased with where we stand today. 12-15-2021 upon evaluation today patient appears to be doing well with regard to his wound. He is showing signs of improvement is slow but nonetheless we are making improvements here. Fortunately I do  not see any evidence of active infection locally nor systemically at this time. We are still using the [facility] topical antibiotic over the open area only. 12-29-2021 upon evaluation today patient's wound actually is showing signs of excellent improvement. It has been 2 weeks since I perform any debridement and it definitely shows he has some tissue that needs to be cleaned away but I think we can do so quite easily and readily today. The good news is I do not see any signs of infection I think he is doing much better in that regard. Overall I am extremely pleased with where we stand. 01-12-2022 upon  evaluation today patient appears to be doing well currently in regard to his wound although it is not getting significantly smaller it is also not getting any larger. Fortunately I do not see any evidence of infection locally nor systemically which is great news and overall I am extremely pleased with where things stand currently. 01-19-2022 upon evaluation today patient appears to be doing well currently in regard to his wound. The PolyMem actually seems to have done extremely well for him. Fortunately I do not see any signs of infection locally nor systemically at this time. 01-26-2022 upon evaluation today patient appears to be making progress. Fortunately there does not appear to be any signs of infection which is great news. No fevers, chills, nausea, vomiting, or diarrhea. With that being said this is very slow to heal and although it is smaller I still feel like we may want to try to do something to speed this up I think that a skin substitute could be beneficial, look into Kerecis. 02-02-2022 upon evaluation today patient's wound is actually showing signs of excellent improvement. I do not see any evidence of infection and overall I think that we are headed in the right direction. Fortunately I think that he is tolerating the dressing changes without complication. 02-09-2022 upon evaluation today  patient appears to be doing well currently in regard to his wound. It does look a little bit macerated but fortunately does not appear to be showing any signs of significant skin breakdown over the macerated area which is good news. Fortunately I do not see any evidence of active infection locally nor systemically at this point which is great news. 02-23-2022 upon evaluation today patient appears to be doing well currently in regard to his wounds. In fact his area on the ankle is actually showing signs of healing quite nicely. We do have the Apligraf ready today I am hopeful this is going to speed things up and get this closed much more effectively and quickly. Fortunately I do not see any signs of active infection locally nor systemically at this time which is great news. 03-02-2022 upon evaluation today patient appears to be doing excellent in regard to his wound. He is actually been tolerating the dressing changes without complication the wound on the left medial lower extremity is doing quite well and Apligraf seems to have been extremely beneficial for him. I am extremely pleased with where we stand today. 03/16/2022: The wound measurements are smaller today. He has accumulation of a yellow crust around the edges and slough on the surface. There is a musty odor coming from the wound. He has been getting Apligraf. Electronic Signature(s) Signed: 03/16/2022 5:34:39 PM By: [name] Maudlin MD FACS Entered By: [name] on 03/16/2022 17:34:39 -------------------------------------------------------------------------------- Physical Exam Details Patient Name: Date of Service: [name], [name] 03/16/2022 3:00 PM Medical Record Number: [id number] Patient Account Number: [id number] Date of Birth/Sex: Treating RN: [birth year] (67 y.o. M) Primary Care Provider: Marton [name] Other Clinician: Referring Provider: Treating Provider/Extender: Hurley Cisco Goodrich, [place] [name] ([id number])  [id number] Page 5 of 13 Weeks in Treatment: 32 Constitutional Hypertensive, asymptomatic. . . . no acute distress. Respiratory Normal work of breathing on room air. Notes 03/16/2022: The wound measurements are smaller today. He has accumulation of a yellow crust around the edges and slough on the surface. There is a musty odor coming from the wound. Electronic Signature(s) Signed: 03/16/2022 5:37:15 PM By: [name] Maudlin MD FACS Entered By: [name] on 03/16/2022 17:37:15 --------------------------------------------------------------------------------  Physician Orders Details Patient Name: Date of Service: [name], [name] 03/16/2022 3:00 PM Medical Record Number: [id number] Patient Account Number: [id number] Date of Birth/Sex: Treating RN: [birth year] (67 y.o. [name], [name] Primary Care Provider: Marton [name] Other Clinician: Referring Provider: Treating Provider/Extender: [name] in Treatment: [phone] Verbal / Phone Orders: No Diagnosis Coding ICD-10 Coding Code Description I89.0 Lymphedema, not elsewhere classified I87.333 Chronic venous hypertension (idiopathic) with ulcer and inflammation of bilateral lower extremity L97.828 Non-pressure chronic ulcer of other part of left lower leg with other specified severity L97.818 Non-pressure chronic ulcer of other part of right lower leg with other specified severity E11.621 Type 2 diabetes mellitus with foot ulcer L97.518 Non-pressure chronic ulcer of other part of right foot with other specified severity Follow-up Appointments ppointment in 2 weeks. Margarita Grizzle [id number] 03/30/2022 3pm Return A Return appointment in 3 weeks. Margarita Grizzle 04/06/2022 3pm Nurse Visit: Margarita Grizzle [id number] 03/23/2022 Wednesday Anesthetic (In clinic) Topical Lidocaine 5% applied to wound bed Cellular or Tissue Based Products Cellular or Tissue Based Product Type: - Run IVR for Kerrecis- denied #1 Apligraf applied  02/23/2022 #2 Apligraf applied 03/02/2022 #3 Apligraf applied 03/16/2022 Cellular or Tissue Based Product applied to wound bed, secured with steri-strips, cover with Adaptic or Mepitel. (DO NOT REMOVE). Bathing/ Shower/ Hygiene May shower with protection but do not get wound dressing(s) wet. Protect dressing(s) with water repellant cover (for example, large plastic bag) or a cast cover and may then take shower. Edema Control - Lymphedema / SCD / Other Elevate legs to the level of the heart or above for 30 minutes daily and/or when sitting for 3-4 times a day throughout the day. Avoid standing for long periods of time. Exercise regularly Moisturize legs daily. - apply every night before bed to left leg. Compression stocking or Garment 30-40 mm/Hg pressure to: - wear your VIVE compression stocking to left leg. apply in the morning and remove at night. Will apply tubigrip size [name] to left leg. Remove once you get home and use your compression stockings. Wound Treatment Wound #7 - Malleolus Wound Laterality: Right, Medial Peri-Wound Care: Triamcinolone 15 (g) 1 x Per Week/30 Days Discharge Instructions: Use triamcinolone 15 (g) as directed Peri-Wound Care: Sween Lotion (Moisturizing lotion) 1 x Per Week/30 Days Discharge Instructions: Apply moisturizing lotion as directed PALMER, CARLS ([id number] Page 6 of 13 Prim Dressing: apligraf #3 1 x Per Week/30 Days ary Discharge Instructions: applied by provider. Prim Dressing: adaptic and steri-strips 1 x Per Week/30 Days ary Discharge Instructions: secured the apligraf. Secondary Dressing: Woven Gauze Sponge, Non-Sterile 4x4 in 1 x Per Week/30 Days Discharge Instructions: Apply over primary dressing as directed. Secondary Dressing: Zetuvit Plus 4x8 in 1 x Per Week/30 Days Discharge Instructions: Apply over primary dressing as directed. Compression Wrap: ThreePress (3 layer compression wrap) 1 x Per Week/30  Days Discharge Instructions: Apply three layer use kerlix instead of cotton compression as directed. Electronic Signature(s) Signed: 03/16/2022 5:39:49 PM By: [name] Maudlin MD FACS Previous Signature: 03/16/2022 5:31:37 PM Version By: Deon Pilling RN, BSN Previous Signature: 03/16/2022 4:27:04 PM Version By: Linton Ham MD Previous Signature: 03/16/2022 5:05:26 PM Version By: Deon Pilling RN, BSN Entered By: [name] on 03/16/2022 17:37:34 -------------------------------------------------------------------------------- Problem List Details Patient Name: Date of Service: [name], [name] 03/16/2022 3:00 PM Medical Record Number: [id number] Patient Account Number: [id number] Date of Birth/Sex: Treating RN: [name] (67 y.o. [name] Primary Care Provider: Marton [name] Other Clinician: Referring Provider:  Treating Provider/Extender: [name] in Treatment: 32 Active Problems ICD-10 Encounter Code Description Active Date MDM Diagnosis I89.0 Lymphedema, not elsewhere classified 08/04/2021 No Yes I87.333 Chronic venous hypertension (idiopathic) with ulcer and inflammation of 08/04/2021 No Yes bilateral lower extremity L97.828 Non-pressure chronic ulcer of other part of left lower leg with other specified 08/04/2021 No Yes severity L97.818 Non-pressure chronic ulcer of other part of right lower leg with other specified 08/04/2021 No Yes severity E11.621 Type 2 diabetes mellitus with foot ulcer 08/04/2021 No Yes L97.518 Non-pressure chronic ulcer of other part of right foot with other specified 08/04/2021 No Yes severity Inactive Problems Resolved Problems Electronic Signature(s) COVEY, BLAZ [name] ([id number] Page 7 of 13 Signed: 03/16/2022 5:32:24 PM By: [name] Maudlin MD FACS Previous Signature: 03/16/2022 5:31:37 PM Version By: Deon Pilling RN, BSN Previous Signature: 03/16/2022 4:27:04 PM Version By: Linton Ham MD Previous Signature: 03/16/2022 5:05:26 PM Version By: Deon Pilling RN, BSN Entered By: [name] on 03/16/2022 17:32:24 -------------------------------------------------------------------------------- Progress Note Details Patient Name: Date of Service: [name], [name] 03/16/2022 3:00 PM Medical Record Number: [id number] Patient Account Number: [id number] Date of Birth/Sex: Treating RN: [birth year] [name] (67 y.o. M) Primary Care Provider: Marton [name] Other Clinician: Referring Provider: Treating Provider/Extender: [name] in Treatment: 68 Subjective Chief Complaint Information obtained from Patient 08/04/2021; patient returns to clinic with bilateral leg wounds as well as areas on the right foot History of Present Illness (HPI) ADMISSION 03/22/2021 This is a 67 year old man with a past medical history significant for diabetes type 2, congestive heart failure, peripheral arterial disease, morbid obesity, venous insufficiency, and coronary artery disease. He has been followed by Dr. Earleen [name] in podiatry, who performed a transmetatarsal amputation on the left foot in August 2022. He had issues healing that wound, but based upon [name] notes, ultimately the TMA wound healed. During his recovery from that surgery, however, ulcers opened up over the DIP joint of the right second and third toe. These have apparently closed and reopened multiple times. It sounds like one of the issues has been moisture accumulation and maceration of the tissues causing them to reopen. At his last visit with Dr. Earleen [name], on March 01, 2021, there continues to be problems with moisture and he was referred to wound care for further evaluation and management. He had a formal aortogram with runoff performed prior to his TMA. The findings are copied here: Patient has inline flow to both feet with no significant flow-limiting lesion that would be amenable to percutaneous or open  revascularization. He does have an element of small vessel disease and has a short segment occlusion of the distal anterior tibial/dorsalis pedis artery on the left foot but does have posterior tibial artery flow. Would recommend management of wounds with amputation of toes 2 and 3 on the right foot if the wounds do not heal and deteriorate. Transmetatarsal amputation on the left side has as good a blood supply as it is going to get and hopefully this will heal in the future. Formal ABIs were done in January 2023. They are normal bilaterally. ABI Findings: +---------+------------------+-----+----------+--------+ Right Rt Pressure (mmHg)IndexWaveform Comment  +---------+------------------+-----+----------+--------+ Brachial 160    +---------+------------------+-----+----------+--------+ PTA 192 1.06 monophasic  +---------+------------------+-----+----------+--------+ DP 159 0.88 monophasic  +---------+------------------+-----+----------+--------+ Great T oe145 0.80    +---------+------------------+-----+----------+--------+ +--------+------------------+-----+---------+-------+ Left Lt Pressure (mmHg)IndexWaveform Comment +--------+------------------+-----+---------+-------+ [id number]    +--------+------------------+-----+---------+-------+ PTA 204 1.13 triphasic  +--------+------------------+-----+---------+-------+ DP 194 1.07 biphasic   +--------+------------------+-----+---------+-------+ +-------+-----------+-----------+------------+------------+ ABI/TBIT oday's ABIT oday's  TBIPrevious ABIPrevious TBI +-------+-----------+-----------+------------+------------+ Right 1.06 0.8 1.26 0.65  +-------+-----------+-----------+------------+------------+ Left 1.13 amputation 1.15 amputation  +-------+-----------+-----------+------------+------------+ Previous ABI on 08/06/20 at [facility] pressures falsely  elevated due to medial calcification. Summary: Right: Resting right ankle-brachial index is within normal range. The right toe-brachial index is normal. Left: Resting left ankle-brachial index is within normal range. [name], MCTIERNAN ([id number]) [id number] Page 8 of 13 READMISSION 08/04/2021 Mr. Karges is now a 68 year old man who I remember from this clinic many years ago I think he had a right lower extremity predominantly venous wound at the time. He was here for 1 visit in March of this year had wounds on his right second and third toes we apparently dressed them many and they healed so he did not come back. He is listed in [place] is being a diabetic although the patient denies this says he is verified it with his primary doctor. In any case over the last several weeks or so according the patient although these wounds look somewhat more chronic than that he has developed predominantly large wounds on the right medial and right lateral ankle smaller areas on the left leg and areas on the dorsal aspect of his right second and third toes. Its not clear how he has been dressing these. 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 The patient has known PAD. He had an angiogram in August 2022 at which time he had nonhealing wounds in both feet. On the left his major vessels in the thigh were all patent. He had three-vessel patent to the level of the ankle. He had a very short occlusion in the left anterior tibial. On the right lower extremity the major vessels in his thigh were all patent. He had three-vessel runoff to the foot sluggish filling of the anterior tibial artery. He was felt to have some component of small vessel disease but nothing that was amenable or needed revascularization. It was recommended that he have amputation of the second and third toes on the right foot if they did not  heal He has been following with Dr. Earleen [name] of podiatry. Dr. Earleen [name] got him juxta lite stockings although I do not think he had them on properly he has uncontrolled edema in both legs Past medical history includes type 2 diabetes [although the patient really denies this], left TMA in 2022,lower extremity wounds in fact attendance at this clinic in 2009-2010, A-fib on Eliquis, chronic venous insufficiency with secondary lymphedema history of non-Hodgkin's lymphoma. 08-11-2021 upon evaluation today patient presents for follow-up evaluation he was seen last Wednesday initially for inspection here in our clinic. With that being said he tells me that he unfortunately has been having a lot of drainage and is actually coming through his wrap. Fortunately I do not see any evidence of active infection locally or systemically at this time which is great news. No fevers, chills, nausea, vomiting, or diarrhea. With that being said there does appear to be some evidence of local infection based on what I am seeing today. 08-18-2021 upon evaluation today patient appears to be doing okay currently in regard to his wounds with that being said that he is doing much better but still has a long ways to go to get to where he wanted to be. I think the infection is significantly improved. He has another week of the antibiotic at this point. 08-25-2021 upon evaluation today patient's wounds are  actually doing decently well he has erythema has significantly improved. I think the cellulitis is under controlled I am going to continue him on 2 more weeks of the Levaquin at 500 mg this is a lower dose but I am hoping it will be better for him. 09-08-2021 upon evaluation today patient appears to be doing excellent in regard to his wounds. Since I last saw him he was actually in the hospital where he ended up having a pacemaker put in. Subsequently he tells me that he is actually doing quite well although they were unsure whether they were  going to do it due to the fact that he had the wounds on his legs. And then I am glad they did anything seem to be doing well. 09-15-2021 upon evaluation patient's wounds are actually showing signs of improvement. The right side wounds do appear to have some need for sharp debridement today and I Georgina Peer go ahead and proceed with that. I think that if we get the wounds cleaned up he will actually show signs of continued improvement. I am also leaning towards switching to [facility] which I think will be a much better option for him. 09-22-2021 patient's wounds are showing signs of excellent improvement. I am actually extremely pleased with where we stand and I think that the patient is making great progress. There does not appear to be any signs of active infection. 09-29-2021 upon evaluation today patient appears to be doing excellent in regard to his wounds. He is actually tolerating the dressing changes without complication. Fortunately I see no evidence of active infection locally or systemically at this time which is great news and overall I am extremely pleased with where we stand currently. 09-2721 upon evaluation today patient actually appears to be doing excellent in regard to his wounds. The left leg is almost completely healed the right ankle is significantly smaller. Overall I am extremely pleased with where we stand at this point. I do not see any evidence of active infection at this time. 10-13-2021 upon evaluation today patient appears to be doing excellent in regard to his wounds. I really feel like he is making good progress here and I am very pleased in that regard. Fortunately I do not see any signs of active infection at this time. We are using the [facility] topical antibiotic therapy. 10-20-2021 upon evaluation today patient appears to be doing well with regard to his wound on the right medial ankle region the left leg is almost completely healed. I am actually very pleased with where  we stand today. 11-03-2021 upon evaluation today patient's wound actually is going require some sharp debridement but appears to be doing much better which is great news. Fortunately I do not see any signs of active infection at this time. 11-10-2021 upon evaluation today patient's wound is actually showing signs of excellent improvement. Fortunately I do not see any evidence of infection locally or systemically which is great news and overall I am extremely pleased with where we stand today. I do believe he is making good progress he does have his [facility] topical antibiotics with him here today. 11-24-2021 upon evaluation today patient actually showing signs of excellent improvement this appears to be doing much better. Fortunately I do not see any evidence of infection locally or systemically at this time. 12-01-2021 upon evaluation today patient appears to be doing well currently in regard to his wound. He has been tolerating the dressing changes without complication. Fortunately I do not see  any evidence of active infection at this time which is great news and overall I am extremely pleased with where we stand today. 12-15-2021 upon evaluation today patient appears to be doing well with regard to his wound. He is showing signs of improvement is slow but nonetheless we are making improvements here. Fortunately I do not see any evidence of active infection locally nor systemically at this time. We are still using the [facility] topical antibiotic over the open area only. 12-29-2021 upon evaluation today patient's wound actually is showing signs of excellent improvement. It has been 2 weeks since I perform any debridement and it definitely shows he has some tissue that needs to be cleaned away but I think we can do so quite easily and readily today. The good news is I do not see any signs of infection I think he is doing much better in that regard. Overall I am extremely pleased with where we  stand. 01-12-2022 upon evaluation today patient appears to be doing well currently in regard to his wound although it is not getting significantly smaller it is also not getting any larger. Fortunately I do not see any evidence of infection locally nor systemically which is great news and overall I am extremely pleased with where things stand currently. 01-19-2022 upon evaluation today patient appears to be doing well currently in regard to his wound. The PolyMem actually seems to have done extremely well for him. Fortunately I do not see any signs of infection locally nor systemically at this time. 01-26-2022 upon evaluation today patient appears to be making progress. Fortunately there does not appear to be any signs of infection which is great news. No fevers, chills, nausea, vomiting, or diarrhea. With that being said this is very slow to heal and although it is smaller I still feel like we may want to try to do something to speed this up I think that a skin substitute could be beneficial, look into Kerecis. 02-02-2022 upon evaluation today patient's wound is actually showing signs of excellent improvement. I do not see any evidence of infection and overall I think that we are headed in the right direction. Fortunately I think that he is tolerating the dressing changes without complication. [name], [name] ([id number]) [id number] Page 9 of 13 02-09-2022 upon evaluation today patient appears to be doing well currently in regard to his wound. It does look a little bit macerated but fortunately does not appear to be showing any signs of significant skin breakdown over the macerated area which is good news. Fortunately I do not see any evidence of active infection locally nor systemically at this point which is great news. 02-23-2022 upon evaluation today patient appears to be doing well currently in regard to his wounds. In fact his area on the ankle is actually showing signs  of healing quite nicely. We do have the Apligraf ready today I am hopeful this is going to speed things up and get this closed much more effectively and quickly. Fortunately I do not see any signs of active infection locally nor systemically at this time which is great news. 03-02-2022 upon evaluation today patient appears to be doing excellent in regard to his wound. He is actually been tolerating the dressing changes without complication the wound on the left medial lower extremity is doing quite well and Apligraf seems to have been extremely beneficial for him. I am extremely pleased with where we stand today. 03/16/2022: The wound measurements are smaller today. He has accumulation  of a yellow crust around the edges and slough on the surface. There is a musty odor coming from the wound. He has been getting Apligraf. Patient History Information obtained from Patient, Chart. Family History Hypertension - Mother, Stroke - Siblings. Social History Former smoker - quit - ended on 03/24/2013, Marital Status - Divorced, Alcohol Use - Rarely, Drug Use - No History, Caffeine Use - Daily - Tea. Medical History Respiratory Patient has history of Sleep Apnea - wears CPAP Cardiovascular Patient has history of Arrhythmia - Atrial Fibrillation, Congestive Heart Failure, Hypertension, Hypotension, Peripheral Arterial Disease, Peripheral Venous Disease Endocrine Patient has history of Type II Diabetes Denies history of Type I Diabetes Musculoskeletal Patient has history of Gout, Osteoarthritis - Hand Hospitalization/Surgery History - 07/2020 left transmet. - 08/2020 aortogram. - Pacemaker 08/30/2021. 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 no acute distress. Vitals Time Taken: 3:03 PM, Height: 71 in, Weight: 350 lbs, BMI: 48.8, Temperature: 98.4 F, Pulse: 89 bpm, Respiratory Rate: 18 breaths/min, Blood Pressure: 153/92 mmHg. Respiratory Normal work of breathing on room air. General Notes: 03/16/2022: The wound measurements are smaller today. He has accumulation of a yellow crust around the edges and slough on the surface. There is a musty odor coming from the wound. Integumentary (Hair, Skin) Wound #7 status is Open. Original cause of wound was Blister. The date acquired was: 07/21/2021. The wound has been in treatment 32 weeks. The wound is located on the Right,Medial Malleolus. The wound measures 4.2cm length x 1.8cm width x 0.2cm depth; 5.938cm^2 area and 1.188cm^3 volume. There is Fat Layer (Subcutaneous Tissue) exposed. There is no tunneling or undermining noted. There is a medium amount of serosanguineous drainage noted. The wound margin is thickened. There is large (67-100%) red, pink granulation within the wound bed. There is a small (1-33%) amount of necrotic tissue within the wound bed including Adherent Slough. The periwound skin appearance exhibited: Scarring, Hemosiderin Staining. The periwound skin appearance did not exhibit: Callus, Crepitus, Excoriation, Induration, Rash, Dry/Scaly, Maceration, Atrophie Blanche, Cyanosis, Ecchymosis, Mottled, Pallor, Rubor, Erythema. [name], [name] ([id number]) [id number] Page 10 of 13 Assessment Active Problems ICD-10 Lymphedema, not elsewhere classified Chronic venous hypertension (idiopathic) with ulcer and inflammation of bilateral lower extremity Non-pressure chronic ulcer of other part of left lower  leg with other specified severity Non-pressure chronic ulcer of other part of right lower leg with other specified severity Type 2 diabetes mellitus with foot ulcer Non-pressure chronic ulcer of other part of right foot with other specified severity Procedures Wound #7 Pre-procedure diagnosis of Wound #7 is a Venous Leg Ulcer located on the Right,Medial Malleolus .Severity of Tissue Pre Debridement is: Fat layer exposed. There was a Excisional Skin/Subcutaneous Tissue Debridement with a total area of 9 sq cm performed by [name] Maudlin, MD. With the following instrument(s): Curette to remove Viable and Non-Viable tissue/material. Material removed includes Subcutaneous Tissue, Slough, Skin: Dermis, and Skin: Epidermis after achieving pain control using Lidocaine 4% Topical Solution. A time out was conducted at 15:20, prior to the start of the procedure. A Minimum amount of bleeding was controlled with Pressure. The procedure was tolerated well with a pain level of 0 throughout and a pain level of  0 following the procedure. Post Debridement Measurements: 4.2cm length x 1.8cm width x 0.2cm depth; 1.188cm^3 volume. Character of Wound/Ulcer Post Debridement is improved. Severity of Tissue Post Debridement is: Fat layer exposed. Post procedure Diagnosis Wound #7: Same as Pre-Procedure Pre-procedure diagnosis of Wound #7 is a Venous Leg Ulcer located on the Right,Medial Malleolus. A skin graft procedure using a bioengineered skin substitute/cellular or tissue based product was performed by [name] Maudlin, MD with the following instrument(s): Forceps and Scissors. Apligraf was applied and secured with Steri-Strips and adaptic. 17 sq cm of product was utilized and 27 sq cm was wasted due to wound size. Post Application, no dressing was applied. A Time Out was conducted at 15:28, prior to the start of the procedure. The procedure was tolerated well with a pain level of 0 throughout and a pain level of 0  following the procedure. Post procedure Diagnosis Wound #7: Same as Pre-Procedure . Pre-procedure diagnosis of Wound #7 is a Venous Leg Ulcer located on the Right,Medial Malleolus . There was a Three Layer Compression Therapy Procedure by Deon Pilling, RN. Post procedure Diagnosis Wound #7: Same as Pre-Procedure Plan Follow-up Appointments: Return Appointment in 2 weeks. Margarita Grizzle [id number] 03/30/2022 3pm Return appointment in 3 weeks. Margarita Grizzle 04/06/2022 3pm Nurse Visit: Margarita Grizzle [id number] 03/23/2022 Wednesday Anesthetic: (In clinic) Topical Lidocaine 5% applied to wound bed Cellular or Tissue Based Products: Cellular or Tissue Based Product Type: - Run IVR for Kerrecis- denied #1 Apligraf applied 02/23/2022 #2 Apligraf applied 03/02/2022 #3 Apligraf applied 03/16/2022 Cellular or Tissue Based Product applied to wound bed, secured with steri-strips, cover with Adaptic or Mepitel. (DO NOT REMOVE). Bathing/ Shower/ Hygiene: May shower with protection but do not get wound dressing(s) wet. Protect dressing(s) with water repellant cover (for example, large plastic bag) or a cast cover and may then take shower. Edema Control - Lymphedema / SCD / Other: Elevate legs to the level of the heart or above for 30 minutes daily and/or when sitting for 3-4 times a day throughout the day. Avoid standing for long periods of time. Exercise regularly Moisturize legs daily. - apply every night before bed to left leg. Compression stocking or Garment 30-40 mm/Hg pressure to: - wear your VIVE compression stocking to left leg. apply in the morning and remove at night. Will apply tubigrip size [name] to left leg. Remove once you get home and use your compression stockings. WOUND #7: - Malleolus Wound Laterality: Right, Medial Peri-Wound Care: Triamcinolone 15 (g) 1 x Per Week/30 Days Discharge Instructions: Use triamcinolone 15 (g) as directed Peri-Wound Care: Sween Lotion (Moisturizing lotion) 1 x Per Week/30 Days Discharge  Instructions: Apply moisturizing lotion as directed Prim Dressing: apligraf #3 1 x Per Week/30 Days ary Discharge Instructions: applied by provider. Prim Dressing: adaptic and steri-strips 1 x Per Week/30 Days ary Discharge Instructions: secured the apligraf. Secondary Dressing: Woven Gauze Sponge, Non-Sterile 4x4 in 1 x Per Week/30 Days Discharge Instructions: Apply over primary dressing as directed. Secondary Dressing: Zetuvit Plus 4x8 in 1 x Per Week/30 Days Discharge Instructions: Apply over primary dressing as directed. Com pression Wrap: ThreePress (3 layer compression wrap) 1 x Per Week/30 Days Discharge Instructions: Apply three layer use kerlix instead of cotton compression as directed. NICHOLOUS, CLEARY ([id number]) [id number] Page 11 of 13 03/16/2022: The wound measurements are smaller today. He has accumulation of a yellow crust around the edges and slough on the surface. There is a musty odor coming from the wound. I used a curette  to debride the yellow crust from around the wound edges along with slough and subcutaneous tissue from the surface of the wound. Apligraf was then fenestrated and applied in standard fashion. I applied a mixture of ketoconazole and zinc oxide around the wound edges to try and address the musty odor that is vaguely fungal. The Apligraf was then secured with Mepitel and Steri-Strips. A gauze sponge was used as a bolster. 3 layer compression was applied. He will follow-up for nurse visit next week and see Jeri Cos in 2 weeks. Electronic Signature(s) Signed: 03/16/2022 5:38:58 PM By: [name] Maudlin MD FACS Entered By: [name] on 03/16/2022 17:38:58 -------------------------------------------------------------------------------- HxROS Details Patient Name: Date of Service: [name], [name] 03/16/2022 3:00 PM Medical Record Number: [id number] Patient Account Number: [id number] Date of Birth/Sex: Treating RN: [name] (67  y.o. 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 Respiratory Medical History: Positive for: Sleep Apnea - wears CPAP Past Medical History Notes: ARDS, Recurrent pneumonia, hypoxic pulmonary HTN Cardiovascular Medical History: Positive for: Arrhythmia - Atrial Fibrillation; Congestive Heart Failure; Hypertension; Hypotension; Peripheral Arterial Disease; Peripheral Venous Disease Past Medical History Notes: Acute/systolic (congestive) heart failure, cardiomyopathy, type 2 diabetes mellitus with peripheral angiopathy Gastrointestinal Medical History: Past Medical History Notes: Dysphagia Endocrine Medical History: Positive for: Type II Diabetes Negative for: Type I Diabetes Treated with: Diet Blood sugar tested every day: No Genitourinary Medical History: Past Medical History Notes: UTI due to extended-spectrum beta lactamase (ESBL) producing Escherichia coli Integumentary (Skin) Medical History: Past Medical History Notes: Psoriasis Musculoskeletal MELVA, CISNEROS [name] ([id number] Page 12 of 13 Medical History: Positive for: Gout; Osteoarthritis - Hand Oncologic Medical History: Past Medical History Notes: Non Hodgkins Lymphoma Immunizations Pneumococcal Vaccine: Received Pneumococcal Vaccination: Yes Received Pneumococcal Vaccination On or After 60th Birthday: No Implantable Devices No devices added Hospitalization / Surgery History Type of Hospitalization/Surgery 07/2020 left transmet 08/2020 aortogram Pacemaker 08/30/2021 Family and Social History Hypertension: Yes - Mother; Stroke: Yes - Siblings; Former smoker - quit - ended on 03/24/2013; Marital  Status - Divorced; Alcohol Use: Rarely; Drug Use: No History; Caffeine Use: Daily - T Financial Concerns: No; Food, Clothing or Shelter Needs: No; Support System Lacking: No; Transportation Concerns: No ea; Electronic Signature(s) Signed: 03/16/2022 5:39:49 PM By: [name] Maudlin MD FACS Entered By: [name] on 03/16/2022 17:36:41 -------------------------------------------------------------------------------- SuperBill Details Patient Name: Date of Service: [place], [name] 03/16/2022 Medical Record Number: [id number] Patient Account Number: [id number] Date of Birth/Sex: Treating RN: [name] (67 y.o. [name], [name] Primary Care Provider: Marton [name] Other Clinician: Referring Provider: Treating Provider/Extender: [name] in Treatment: 32 Diagnosis Coding ICD-10 Codes Code Description I89.0 Lymphedema, not elsewhere classified I87.333 Chronic venous hypertension (idiopathic) with ulcer and inflammation of bilateral lower extremity L97.828 Non-pressure chronic ulcer of other part of left lower leg with other specified severity L97.818 Non-pressure chronic ulcer of other part of right lower leg with other specified severity E11.621 Type 2 diabetes mellitus with foot ulcer L97.518 Non-pressure chronic ulcer of other part of right foot with other specified severity Facility Procedures : CPT4 Code: [id number] Description: (Facility Use Only) Apligraf 1 SQ CM Modifier: Quantity: 44 : CPT4 Code: [id number] Description: [id number] - SKIN SUB GRAFT FACE/NK/HF/G ICD-10 Diagnosis Description L97.818 Non-pressure chronic ulcer of other part of right lower leg with other  specified Modifier: severity Quantity: 1 Physician Procedures : CPT4 Code Description Modifier [id number] - WC PHYS LEVEL 3 - EST PT 25 ICD-10 Diagnosis Description L97.818 Non-pressure chronic ulcer of other part of right lower leg with other specified severity I87.333 Chronic venous  hypertension (idiopathic)  with ulcer and inflammation of bilateral lower extremity I89.0 Lymphedema, not elsewhere classified IANN, CLINGERMAN ([id number]) [id number] Type 2 diabetes mellitus with foot ulcer Quantity: 1 df Page 13 of 13 : [id number] - WC PHYS SKIN SUB GRAFT FACE/NK/HF/G 1 ICD-10 Diagnosis Description L97.818 Non-pressure chronic ulcer of other part of right lower leg with other specified severity Quantity: Electronic Signature(s) Signed: 03/16/2022 5:39:38 PM By: [name] Maudlin MD FACS Previous Signature: 03/16/2022 5:31:37 PM Version By: Deon Pilling RN, BSN Previous Signature: 03/16/2022 4:27:04 PM Version By: Linton Ham MD Previous Signature: 03/16/2022 5:05:26 PM Version By: Deon Pilling RN, BSN Entered By: [name] on 03/16/2022 17:39:38

## 2022-03-18 NOTE — Progress Notes (Signed)
ROYZELL, ARMINIO ([id number]) [id number] Page 1 of 7 Visit Report for 03/16/2022 Arrival Information Details Patient Name: Date of Service: [name], [name] 03/16/2022 3:00 PM Medical Record Number: [id number] Patient Account Number: [id number] Date of Birth/Sex: Treating RN: 01-Nov-1955 (67 y.o. M) Primary Care [name]: [name] Other Clinician: Referring [name]: Treating [name]/Extender: [name] in Treatment: 54 Visit Information History Since Last Visit Added or deleted any medications: No Patient Arrived: [name] Any new allergies or adverse reactions: No Arrival Time: 15:03 Had a fall or experienced change in No Accompanied By: self activities of daily living that may affect Transfer Assistance: None risk of falls: Patient Identification Verified: Yes Signs or symptoms of abuse/neglect since last visito No Secondary Verification Process Completed: Yes Hospitalized since last visit: No Patient Requires Transmission-Based Precautions: No Implantable device outside of the clinic excluding No Patient Has Alerts: Yes cellular tissue based products placed in the center Patient Alerts: Patient on Blood Thinner since last visit: 01/2021 ABI L 1.13 R 1.06 Has Dressing in Place as Prescribed: Yes 01/2021 TBI L amp R 0.8 Pain Present Now: No Electronic Signature(s) Signed: 03/16/2022 5:31:37 PM By: [name] Pilling RN, BSN Previous Signature: 03/16/2022 3:26:38 PM Version By: [name] Entered By: [name] on 03/16/2022 17:28:26 -------------------------------------------------------------------------------- Compression Therapy Details Patient Name: Date of Service: [name], [name] 03/16/2022 3:00 PM Medical Record Number: [id number] Patient Account Number: [id number] Date of Birth/Sex: Treating RN: 04/06/1955 (67 y.o. [name] Primary Care Ileah Falkenstein: [name] Other Clinician: Referring [name]: Treating  [name]/Extender: [name] in Treatment: 32 Compression Therapy Performed for Wound Assessment: Wound #7 Right,Medial Malleolus Performed By: Clinician [name] Pilling, RN Compression Type: Three Layer Post Procedure Diagnosis Same as Pre-procedure Electronic Signature(s) Signed: 03/16/2022 5:31:37 PM By: [name] Pilling RN, BSN Previous Signature: 03/16/2022 5:05:26 PM Version By: [name] Pilling RN, BSN Entered By: [name] on 03/16/2022 17:27:42 -------------------------------------------------------------------------------- Encounter Discharge Information Details Patient Name: Date of Service: [name], [name] 03/16/2022 3:00 PM Medical Record Number: [id number] Patient Account Number: [id number] Date of Birth/Sex: Treating RN: 08-26-1955 (67 y.o. [name] Primary Care Zaviyar Rahal: [name] Other Clinician: Referring Vincenza Dail: Treating Kysean Sweet/Extender: [name] in Treatment: 32 Encounter Discharge Information Items Post Procedure Vitals Discharge Condition: Stable Temperature (F): 98.4 Ambulatory Status: Walker Pulse (bpm): 89 Discharge Destination: Home Respiratory Rate (breaths/min): 18 Transportation: Private Auto Blood Pressure (mmHg): 153/92 [name] ([id number]) [id number] Page 2 of 7 Accompanied By: self Schedule Follow-up Appointment: Yes Clinical Summary of Care: Electronic Signature(s) Signed: 03/16/2022 5:31:37 PM By: [name] Pilling RN, BSN Previous Signature: 03/16/2022 5:05:26 PM Version By: [name] Pilling RN, BSN Entered By: [name] on 03/16/2022 17:30:11 -------------------------------------------------------------------------------- Lower Extremity Assessment Details Patient Name: Date of Service: [name], [name] 03/16/2022 3:00 PM Medical Record Number: [id number] Patient Account Number: [id number] Date of Birth/Sex: Treating RN: 01/23/1955 (67 y.o. [name] Primary Care Malessa Zartman: [name] Other Clinician: Referring [name]: Treating [name]/Extender: [name] in Treatment: 32 Edema Assessment Assessed: [name]: No] [name]: No] Edema: [Left: N] [Right: o] Calf Left: Right: Point of Measurement: 31 cm From Medial Instep 40 cm Ankle Left: Right: Point of Measurement: 11 cm From Medial Instep 26.5 cm Electronic Signature(s) Signed: 03/16/2022 5:31:37 PM By: [name] Pilling RN, BSN Previous Signature: 03/16/2022 5:05:26 PM Version By: [name] Pilling RN, BSN Entered By: [name] on 03/16/2022 17:28:43 -------------------------------------------------------------------------------- Multi Wound Chart Details Patient Name: Date of Service:  [name], [name] 03/16/2022 3:00 PM Medical Record Number: [id number] Patient Account Number: [id number] Date of Birth/Sex: Treating RN: 06-10-1955 (67 y.o. M) Primary Care [name]: [name] Other Clinician: Referring [name]: Treating [name]/Extender: [name] in Treatment: 32 Vital Signs Height(in): 71 Pulse(bpm): 89 Weight(lbs): 350 Blood Pressure(mmHg): 153/92 Body Mass Index(BMI): 48.8 Temperature(F): 98.4 Respiratory Rate(breaths/min): 18 [7:Photos:] [[name]:[name]] Right, Medial Malleolus [name] [name] Wound Location: Blister [name] [name] Wounding Event: Venous Leg Ulcer [name] [name] Primary EtiologyKYNGSTON, AMORIN [name] ([id number].pdf Page 3 of 7 Lymphedema [name] [name] Secondary Etiology: Sleep Apnea, Arrhythmia, Congestive [name] [name] Comorbid History: Heart Failure, Hypertension, Hypotension, Peripheral Arterial Disease, Peripheral Venous Disease, Type II Diabetes, Gout, Osteoarthritis 07/21/2021 [name] [name] Date Acquired: 51 [name] [name] Weeks of Treatment: Open [name] [name] Wound Status: No [name] [name] Wound Recurrence: 4.2x1.8x0.2 [name] [name] Measurements L x W x [name] 5.938 [name] [name] A (cm) : rea 1.188 [name]  [name] Volume (cm) : 84.50% [name] [name] % Reduction in A rea: 95.60% [name] [name] % Reduction in Volume: Full Thickness With Exposed Support [name] [name] Classification: Structures Medium [name] [name] Exudate A mount: Serosanguineous [name] [name] Exudate Type: red, brown [name] [name] Exudate Color: Thickened [name] [name] Wound Margin: Large (67-100%) [name] [name] Granulation A mount: Red, Pink [name] [name] Granulation Quality: Small (1-33%) [name] [name] Necrotic A mount: Fat Layer (Subcutaneous Tissue): Yes [name] [name] Exposed Structures: Fascia: No Tendon: No Muscle: No Joint: No Bone: No Small (1-33%) [name] [name] Epithelialization: Debridement - Excisional [name] [name] Debridement: Pre-procedure Verification/Time Out 15:20 [name] [name] Taken: Lidocaine 4% Topical Solution [name] [name] Pain Control: Subcutaneous, Slough [name] [name] Tissue Debrided: Skin/Subcutaneous Tissue [name] [name] Level: 9 [name] [name] Debridement A (sq cm): rea Curette [name] [name] Instrument: Minimum [name] [name] Bleeding: Pressure [name] [name] Hemostasis A chieved: 0 [name] [name] Procedural Pain: 0 [name] [name] Post Procedural Pain: Procedure was tolerated well [name] [name] Debridement Treatment Response: 4.2x1.8x0.2 [name] [name] Post Debridement Measurements L x W x [name] 1.188 [name] [name] Post Debridement Volume: (cm) Scarring: Yes [name] [name] Periwound Skin Texture: Excoriation: No Induration: No Callus: No Crepitus: No Rash: No Maceration: No [name] [name] Periwound Skin Moisture: Dry/Scaly: No Hemosiderin Staining: Yes [name] [name] Periwound Skin Color: Atrophie Blanche: No Cyanosis: No Ecchymosis: No Erythema: No Mottled: No Pallor: No Rubor: No Cellular or Tissue Based Product [name] [name] Procedures Performed: Compression Therapy Debridement Treatment Notes Wound #7 (Malleolus) Wound Laterality: Right, Medial Cleanser Peri-Wound Care Triamcinolone 15 (g) Discharge Instruction: Use triamcinolone 15 (g) as directed Sween Lotion (Moisturizing lotion) Discharge Instruction: Apply  moisturizing lotion as directed Topical Primary Dressing apligraf #3 Discharge Instruction: applied by Elita Dame. AURELIUS, HOLL ([id number]) [id number] Page 4 of 7 adaptic and steri-strips Discharge Instruction: secured the apligraf. Secondary Dressing Woven Gauze Sponge, Non-Sterile 4x4 in Discharge Instruction: Apply over primary dressing as directed. Zetuvit Plus 4x8 in Discharge Instruction: Apply over primary dressing as directed. Secured With Compression Wrap ThreePress (3 layer compression wrap) Discharge Instruction: Apply three layer use kerlix instead of cotton compression as directed. Compression Stockings Add-Ons Electronic Signature(s) Signed: 03/16/2022 5:32:34 PM By: Fredirick Maudlin MD FACS Entered By: Fredirick Maudlin on 03/16/2022 17:32:34 -------------------------------------------------------------------------------- Multi-Disciplinary Care Plan Details Patient Name: Date of Service: [name], [name] 03/16/2022 3:00 PM Medical Record Number: [id number] Patient Account Number: [id number] Date of Birth/Sex: Treating RN: 1955-12-10 (67 y.o. [name] Primary Care Taneia Mealor: [name] Other Clinician: Referring Britzy Graul: Treating Diedre Maclellan/Extender: [name] in Treatment: [place]  Plan reviewed with physician Active Inactive Pain, Acute or Chronic Nursing Diagnoses: Pain, acute or chronic: actual or potential Potential alteration in comfort, pain Goals: Patient will verbalize adequate pain control and receive pain control interventions during procedures as needed Date Initiated: 08/04/2021 Target Resolution Date: 04/09/2022 Goal Status: Active Patient/caregiver will verbalize comfort level met Date Initiated: 08/04/2021 Target Resolution Date: 04/08/2022 Goal Status: Active Interventions: Encourage patient to take pain medications as prescribed Provide education on pain  management Reposition patient for comfort Treatment Activities: Administer pain control measures as ordered : 08/04/2021 Notes: Electronic Signature(s) Signed: 03/16/2022 5:31:37 PM By: [name] Pilling RN, BSN Previous Signature: 03/16/2022 5:05:26 PM Version By: [name] Pilling RN, BSN Entered By: [name] on 03/16/2022 17:29:26 Deboer, [name] ([id number] Page 5 of 7 -------------------------------------------------------------------------------- Pain Assessment Details Patient Name: Date of Service: [name], [name] 03/16/2022 3:00 PM Medical Record Number: [id number] Patient Account Number: [id number] Date of Birth/Sex: Treating RN: 11-29-1955 (67 y.o. M) Primary Care Roberta Kelly: [name] Other Clinician: Referring Treacy Holcomb: Treating Karra Pink/Extender: [name] in Treatment: 32 Active Problems Location of Pain Severity and Description of Pain Patient Has Paino No Site Locations Pain Management and Medication Current Pain Management: Electronic Signature(s) Signed: 03/16/2022 5:31:37 PM By: [name] Pilling RN, BSN Previous Signature: 03/16/2022 3:26:38 PM Version By: [name] Entered By: [name] on 03/16/2022 17:28:39 -------------------------------------------------------------------------------- Patient/Caregiver Education Details Patient Name: Date of Service: [name], [name] 3/6/2024andnbsp3:00 PM Medical Record Number: [id number] Patient Account Number: [id number] Date of Birth/Gender: Treating RN: [birth year] (66 y.o. [name] Primary Care Physician: [name] Other Clinician: Referring Physician: Treating Physician/Extender: [name] in Treatment: 54 Education Assessment Education Provided To: Patient Education Topics Provided Wound/Skin Impairment: Handouts: Caring for Your Ulcer Methods: Explain/Verbal Responses: Reinforcements needed Electronic  Signature(s) Signed: 03/16/2022 5:31:37 PM By: [name] Pilling RN, BSN Previous Signature: 03/16/2022 5:05:26 PM Version By: [name] Pilling RN, BSN Entered By: [name] on 03/16/2022 17:29:38 Limberg, [name] ([id number] Page 6 of 7 -------------------------------------------------------------------------------- Wound Assessment Details Patient Name: Date of Service: [name], [name] 03/16/2022 3:00 PM Medical Record Number: [id number] Patient Account Number: [id number] Date of Birth/Sex: Treating RN: 05/18/1955 (67 y.o. M) Primary Care Rafay Dahan: [name] Other Clinician: Referring Justin Buechner: Treating Sabeen Piechocki/Extender: [name] in Treatment: 32 Wound Status Wound Number: 7 Primary Venous Leg Ulcer Etiology: Wound Location: Right, Medial Malleolus Secondary Lymphedema Wounding Event: Blister Etiology: Date Acquired: 07/21/2021 Wound Open Weeks Of Treatment: 32 Status: Clustered Wound: No Comorbid Sleep Apnea, Arrhythmia, Congestive Heart Failure, Hypertension, History: Hypotension, Peripheral Arterial Disease, Peripheral Venous Disease, Type II Diabetes, Gout, Osteoarthritis Photos Wound Measurements Length: (cm) 4.2 Width: (cm) 1.8 Depth: (cm) 0.2 Area: (cm) 5.938 Volume: (cm) 1.188 % Reduction in Area: 84.5% % Reduction in Volume: 95.6% Epithelialization: Small (1-33%) Tunneling: No Undermining: No Wound Description Classification: Full Thickness With Exposed Suppo Wound Margin: Thickened Exudate Amount: Medium Exudate Type: Serosanguineous Exudate Color: red, brown rt Structures Foul Odor After Cleansing: No Slough/Fibrino Yes Wound Bed Granulation Amount: Large (67-100%) Exposed Structure Granulation Quality: Red, Pink Fascia Exposed: No Necrotic Amount: Small (1-33%) Fat Layer (Subcutaneous Tissue) Exposed: Yes Necrotic Quality: Adherent Slough Tendon Exposed: No Muscle Exposed: No Joint  Exposed: No Bone Exposed: No Periwound Skin Texture Texture Color No Abnormalities Noted: No No Abnormalities Noted: No Callus: No Atrophie Blanche: No Crepitus: No Cyanosis: No Excoriation: No Ecchymosis: No Induration: No Erythema: No Rash: No Hemosiderin Staining: Yes Scarring: Yes  Mottled: No Pallor: No Moisture Rubor: No No Abnormalities Noted: No Dry / Scaly: No Maceration: No Treatment Notes [name], [name] ([id number] Page 7 of 7 Wound #7 (Malleolus) Wound Laterality: Right, Medial Cleanser Peri-Wound Care Triamcinolone 15 (g) Discharge Instruction: Use triamcinolone 15 (g) as directed Sween Lotion (Moisturizing lotion) Discharge Instruction: Apply moisturizing lotion as directed Topical Primary Dressing apligraf #3 Discharge Instruction: applied by Tyee Vandevoorde. adaptic and steri-strips Discharge Instruction: secured the apligraf. Secondary Dressing Woven Gauze Sponge, Non-Sterile 4x4 in Discharge Instruction: Apply over primary dressing as directed. Zetuvit Plus 4x8 in Discharge Instruction: Apply over primary dressing as directed. Secured With Compression Wrap ThreePress (3 layer compression wrap) Discharge Instruction: Apply three layer use kerlix instead of cotton compression as directed. Compression Stockings Add-Ons Electronic Signature(s) Signed: 03/16/2022 5:31:37 PM By: [name] Pilling RN, BSN Previous Signature: 03/16/2022 5:05:26 PM Version By: [name] Pilling RN, BSN Entered By: [name] on 03/16/2022 17:28:51 -------------------------------------------------------------------------------- Vitals Details Patient Name: Date of Service: [place], [name] 03/16/2022 3:00 PM Medical Record Number: [id number] Patient Account Number: [id number] Date of Birth/Sex: Treating RN: 1955/12/04 (67 y.o. M) Primary Care Cedrica Brune: [name] Other Clinician: Referring Kiko Ripp: Treating Alonna Bartling/Extender: [name] in Treatment: 32 Vital Signs Time Taken: 15:03 Temperature (F): 98.4 Height (in): 71 Pulse (bpm): 89 Weight (lbs): 350 Respiratory Rate (breaths/min): 18 Body Mass Index (BMI): 48.8 Blood Pressure (mmHg): 153/92 Reference Range: 80 - 120 mg / dl Electronic Signature(s) Signed: 03/16/2022 5:31:37 PM By: [name] Pilling RN, BSN Previous Signature: 03/16/2022 3:26:38 PM Version By: [name] Entered By: [name] on 03/16/2022 17:28:31

## 2022-03-21 ENCOUNTER — Inpatient Hospital Stay: Payer: Medicare Other | Admitting: Internal Medicine

## 2022-03-21 ENCOUNTER — Other Ambulatory Visit: Payer: Self-pay

## 2022-03-21 ENCOUNTER — Ambulatory Visit (INDEPENDENT_AMBULATORY_CARE_PROVIDER_SITE_OTHER): Payer: Medicare Other

## 2022-03-21 ENCOUNTER — Inpatient Hospital Stay: Payer: Medicare Other | Attending: Internal Medicine

## 2022-03-21 VITALS — BP 144/85 | HR 81 | Temp 98.1°F | Resp 17 | Wt 336.1 lb

## 2022-03-21 DIAGNOSIS — D696 Thrombocytopenia, unspecified: Secondary | ICD-10-CM | POA: Diagnosis not present

## 2022-03-21 DIAGNOSIS — C8212 Follicular lymphoma grade II, intrathoracic lymph nodes: Secondary | ICD-10-CM

## 2022-03-21 DIAGNOSIS — Z950 Presence of cardiac pacemaker: Secondary | ICD-10-CM | POA: Diagnosis not present

## 2022-03-21 DIAGNOSIS — J849 Interstitial pulmonary disease, unspecified: Secondary | ICD-10-CM | POA: Insufficient documentation

## 2022-03-21 DIAGNOSIS — I442 Atrioventricular block, complete: Secondary | ICD-10-CM | POA: Diagnosis not present

## 2022-03-21 DIAGNOSIS — Z9221 Personal history of antineoplastic chemotherapy: Secondary | ICD-10-CM | POA: Insufficient documentation

## 2022-03-21 DIAGNOSIS — C8590 Non-Hodgkin lymphoma, unspecified, unspecified site: Secondary | ICD-10-CM

## 2022-03-21 DIAGNOSIS — Z8572 Personal history of non-Hodgkin lymphomas: Secondary | ICD-10-CM | POA: Insufficient documentation

## 2022-03-21 LAB — CMP (CANCER CENTER ONLY)
ALT: 9 U/L (ref 0–44)
AST: 16 U/L (ref 15–41)
Albumin: 4.1 g/dL (ref 3.5–5.0)
Alkaline Phosphatase: 153 U/L — ABNORMAL HIGH (ref 38–126)
Anion gap: 8 (ref 5–15)
BUN: 18 mg/dL (ref 8–23)
CO2: 27 mmol/L (ref 22–32)
Calcium: 9.4 mg/dL (ref 8.9–10.3)
Chloride: 103 mmol/L (ref 98–111)
Creatinine: 0.87 mg/dL (ref 0.61–1.24)
GFR, Estimated: >60 mL/min (ref >60)
Glucose, Bld: 119 mg/dL — ABNORMAL HIGH (ref 70–99)
Potassium: 3.8 mmol/L (ref 3.5–5.1)
Sodium: 138 mmol/L (ref 135–145)
Total Bilirubin: 1.8 mg/dL — ABNORMAL HIGH (ref 0.3–1.2)
Total Protein: 7.3 g/dL (ref 6.5–8.1)

## 2022-03-21 LAB — CBC WITH DIFFERENTIAL (CANCER CENTER ONLY)
Abs Immature Granulocytes: 0.02 10*3/uL (ref 0.00–0.07)
Basophils Absolute: 0.1 10*3/uL (ref 0.0–0.1)
Basophils Relative: 1 %
Eosinophils Absolute: 0.1 10*3/uL (ref 0.0–0.5)
Eosinophils Relative: 2 %
HCT: 42.5 % (ref 39.0–52.0)
Hemoglobin: 13.8 g/dL (ref 13.0–17.0)
Immature Granulocytes: 0 %
Lymphocytes Relative: 14 %
Lymphs Abs: 0.9 10*3/uL (ref 0.7–4.0)
MCH: 30.7 pg (ref 26.0–34.0)
MCHC: 32.5 g/dL (ref 30.0–36.0)
MCV: 94.7 fL (ref 80.0–100.0)
Monocytes Absolute: 0.7 10*3/uL (ref 0.1–1.0)
Monocytes Relative: 11 %
Neutro Abs: 4.5 10*3/uL (ref 1.7–7.7)
Neutrophils Relative %: 72 %
Platelet Count: 124 10*3/uL — ABNORMAL LOW (ref 150–400)
RBC: 4.49 MIL/uL (ref 4.22–5.81)
RDW: 18 % — ABNORMAL HIGH (ref 11.5–15.5)
WBC Count: 6.3 10*3/uL (ref 4.0–10.5)
nRBC: 0 % (ref 0.0–0.2)

## 2022-03-21 LAB — LACTATE DEHYDROGENASE: LDH: 177 U/L (ref 98–192)

## 2022-03-21 NOTE — Progress Notes (Signed)
[name] [phone]   [phone]  OFFICE PROGRESS NOTE  Ginger Organ., MD [place] [id number]  DIAGNOSIS: : Stage III follicular lymphoma, grade 2, diagnosed in June 2012   PRIOR THERAPY:  1) Systemic chemotherapy with CHOP/Rituxan, status post 8 cycles, last dose was given 07/19/2010.  2)  Maintenance therapy with Rituxan 375 mg/m2 given every 2 months for a total of 2 years, status post 12 cycles.  CURRENT THERAPY: Observation.  INTERVAL HISTORY: [name] 67 y.o. male returns to the clinic today for annual follow-up visit.  The patient is feeling fine today with no concerning complaints.  He had complete heart block in August 2023 status post pacemaker placement.  He is followed by cardiology as well as Dr. Vaughan Browner for interstitial lung disease.  He denied having any current chest pain but has shortness of breath with exertion with no cough or hemoptysis.  He has no nausea, vomiting, diarrhea or constipation.  He has no recent weight loss or night sweats.  He is here today for evaluation and repeat blood work.  MEDICAL HISTORY: Past Medical History:  Diagnosis Date   Acute systolic HF (heart failure) (HCC)    Arthritis    lt foot, hips knees    Atrial fibrillation ([place])    Diabetes mellitus, new onset ([place])    Diverticulosis    Dyslipidemia    Gout attack 01/16/2012   Hypertension    Internal hemorrhoids    Lymphoma ([place])    remission for about 2 years, chemo 3 years prior   Pneumonia due to COVID-19 virus 07/2018   Pulmonary hypertension ([place])    Sepsis ([place]) 02/2017   secondary to influenza; requiring trach   Tubular adenoma of colon    Venous stasis ulcers (HCC)     ALLERGIES:  is allergic to latex, ace inhibitors, and penicillin g.  MEDICATIONS:  Current Outpatient Medications  Medication Sig Dispense Refill   acetaminophen (TYLENOL) 325 MG tablet Take 650 mg by mouth every 6 (six) hours as  needed for moderate pain.      ascorbic acid (VITAMIN C) 500 MG tablet Take 1 tablet (500 mg total) by mouth 3 (three) times daily. (Patient taking differently: Take 500 mg by mouth 2 (two) times daily.) 30 tablet 0   aspirin 81 MG chewable tablet Chew 1 tablet (81 mg total) by mouth daily. 30 tablet 11   atorvastatin (LIPITOR) 40 MG tablet TAKE 1 TABLET BY MOUTH EVERY DAY 30 tablet 11   carvedilol (COREG) 3.125 MG tablet Take 1 tablet (3.125 mg total) by mouth 2 (two) times daily. 180 tablet 3   cholecalciferol (VITAMIN D) 25 MCG (1000 UNIT) tablet Take 1 tablet (1,000 Units total) by mouth daily. 30 tablet 0   colchicine 0.6 MG tablet Take 0.6 mg by mouth 2 (two) times daily as needed.     Continuous Blood Gluc Receiver (FREESTYLE LIBRE 2 READER) DEVI 1 each by Does not apply route daily. 1 each 3   Continuous Blood Gluc Sensor (FREESTYLE LIBRE 2 SENSOR) MISC 1 each by Does not apply route daily. 1 each 3   dapagliflozin propanediol (FARXIGA) 10 MG TABS tablet Take 1 tablet (10 mg total) by mouth daily. 30 tablet 11   ELIQUIS 5 MG TABS tablet TAKE 1 TABLET BY MOUTH TWICE A DAY 180 tablet 1   KLOR-CON M20 20 MEQ tablet Take 20 mEq by mouth daily.  Omega 3 340 MG CPDR Take 1 capsule (340 mg total) by mouth 2 (two) times daily. (Patient taking differently: Take 340 mg by mouth daily.) 60 capsule 0   omeprazole (PRILOSEC) 40 MG capsule Take 1 capsule (40 mg total) by mouth daily. 30 capsule 0   polycarbophil (FIBERCON) 625 MG tablet Take 1 tablet (625 mg total) by mouth daily. 30 tablet 0   sacubitril-valsartan (ENTRESTO) 97-103 MG Take 1 tablet by mouth 2 (two) times daily. 60 tablet 11   sertraline (ZOLOFT) 50 MG tablet Take 1 tablet (50 mg total) by mouth daily. 30 tablet 0   spironolactone (ALDACTONE) 25 MG tablet Take 1 tablet (25 mg total) by mouth daily. 30 tablet 11   Tirzepatide (MOUNJARO [place]) Inject 1 Dose into the skin once a week.     torsemide (DEMADEX) 20 MG tablet Take 1 tablet (20  mg total) by mouth as needed. 30 tablet 6   vitamin B-12 (CYANOCOBALAMIN) 1000 MCG tablet Take 1 tablet (1,000 mcg total) by mouth daily. 30 tablet 0   No current facility-administered medications for this visit.    REVIEW OF SYSTEMS:  A comprehensive review of systems was negative except for: Respiratory: positive for dyspnea on exertion   PHYSICAL EXAMINATION: General appearance: alert, cooperative, and no distress Head: Normocephalic, without obvious abnormality, atraumatic Neck: no adenopathy Lymph nodes: Cervical, supraclavicular, and axillary nodes normal. Resp: clear to auscultation bilaterally Back: symmetric, no curvature. ROM normal. No CVA tenderness. Cardio: regular rate and rhythm, S1, S2 normal, no murmur, click, rub or gallop GI: soft, non-tender; bowel sounds normal; no masses,  no organomegaly Extremities: extremities normal, atraumatic, no cyanosis or edema  ECOG PERFORMANCE STATUS: 1 - Symptomatic but completely ambulatory  Blood pressure (!) 144/85, pulse 81, temperature 98.1 F (36.7 C), temperature source Oral, resp. rate 17, weight (!) 336 lb 1.6 oz (152.5 kg), SpO2 97 %.  LABORATORY DATA: Lab Results  Component Value Date   WBC 6.3 03/21/2022   HGB 13.8 03/21/2022   HCT 42.5 03/21/2022   MCV 94.7 03/21/2022   PLT 124 (L) 03/21/2022      Chemistry      Component Value Date/Time   NA 140 11/29/2021 1435   NA 137 10/24/2016 1440   K 4.2 11/29/2021 1435   K 3.6 10/24/2016 1440   CL 103 11/29/2021 1435   CL 105 05/14/2012 0840   CO2 20 11/29/2021 1435   CO2 26 10/24/2016 1440   BUN 20 11/29/2021 1435   BUN 16.2 10/24/2016 1440   CREATININE 0.80 11/29/2021 1435   CREATININE 1.04 02/22/2021 1452   CREATININE 0.8 10/24/2016 1440      Component Value Date/Time   CALCIUM 9.5 11/29/2021 1435   CALCIUM 9.2 10/24/2016 1440   ALKPHOS 120 08/26/2021 1253   ALKPHOS 131 10/24/2016 1440   AST 21 08/26/2021 1253   AST 22 02/22/2021 1452   AST 31  10/24/2016 1440   ALT 10 08/26/2021 1253   ALT 18 02/22/2021 1452   ALT 31 10/24/2016 1440   BILITOT 1.8 (H) 08/26/2021 1253   BILITOT 1.6 (H) 02/22/2021 1452   BILITOT 0.61 10/24/2016 1440      RADIOLOGY STUDIES: No results found. ASSESSMENT AND PLAN:  This is a very pleasant 67 years old white male with a stage IIIa follicular lymphoma status post treatment with CHOP/Rituxan followed by maintenance Rituxan for 2 years and has been observation for more than 11 years now. The patient has been on observation and  he is feeling fine with no concerning complaints except for the recent cardiac condition and he is followed by cardiology. Repeat blood work today is unremarkable except for mild thrombocytopenia probably secondary to his recent cardiac medications. I recommended for the patient to continue on observation with routine follow-up visit by his primary care physician from now on.  I will see him on as-needed basis in the future. He was advised to call if he has any concerning issues in the interval. All questions were answered. The patient knows to call the clinic with any problems, questions or concerns. We can certainly see the patient much sooner if necessary.  Disclaimer: This note was dictated with voice recognition software. Similar sounding words can inadvertently be transcribed and may not be corrected upon review.

## 2022-03-23 ENCOUNTER — Encounter (HOSPITAL_BASED_OUTPATIENT_CLINIC_OR_DEPARTMENT_OTHER): Payer: Medicare Other | Attending: Internal Medicine | Admitting: Internal Medicine

## 2022-03-23 DIAGNOSIS — L97828 Non-pressure chronic ulcer of other part of left lower leg with other specified severity: Secondary | ICD-10-CM | POA: Insufficient documentation

## 2022-03-23 DIAGNOSIS — L97818 Non-pressure chronic ulcer of other part of right lower leg with other specified severity: Secondary | ICD-10-CM | POA: Insufficient documentation

## 2022-03-23 DIAGNOSIS — I87333 Chronic venous hypertension (idiopathic) with ulcer and inflammation of bilateral lower extremity: Secondary | ICD-10-CM | POA: Insufficient documentation

## 2022-03-23 DIAGNOSIS — L97518 Non-pressure chronic ulcer of other part of right foot with other specified severity: Secondary | ICD-10-CM | POA: Insufficient documentation

## 2022-03-23 DIAGNOSIS — M109 Gout, unspecified: Secondary | ICD-10-CM | POA: Insufficient documentation

## 2022-03-23 DIAGNOSIS — I890 Lymphedema, not elsewhere classified: Secondary | ICD-10-CM | POA: Diagnosis not present

## 2022-03-23 DIAGNOSIS — M1990 Unspecified osteoarthritis, unspecified site: Secondary | ICD-10-CM | POA: Diagnosis not present

## 2022-03-23 DIAGNOSIS — E11621 Type 2 diabetes mellitus with foot ulcer: Secondary | ICD-10-CM | POA: Diagnosis not present

## 2022-03-23 DIAGNOSIS — E1151 Type 2 diabetes mellitus with diabetic peripheral angiopathy without gangrene: Secondary | ICD-10-CM | POA: Diagnosis not present

## 2022-03-23 LAB — CUP PACEART REMOTE DEVICE CHECK
Battery Remaining Longevity: 87 mo
Battery Voltage: 3.02 V
Brady Statistic AP VP Percent: 96.78 %
Brady Statistic AP VS Percent: 0.02 %
Brady Statistic AS VP Percent: 2.56 %
Brady Statistic AS VS Percent: 0.65 %
Brady Statistic RA Percent Paced: 94.53 %
Brady Statistic RV Percent Paced: 97.11 %
Date Time Interrogation Session: 20240311022603
HighPow Impedance: 77 Ohm
Implantable Lead Connection Status: 753985
Implantable Lead Connection Status: 753985
Implantable Lead Implant Date: 20230823
Implantable Lead Implant Date: 20230823
Implantable Lead Location: 753859
Implantable Lead Location: 753860
Implantable Lead Model: 3830
Implantable Lead Model: 6935
Implantable Pulse Generator Implant Date: 20230823
Lead Channel Impedance Value: 304 Ohm
Lead Channel Impedance Value: 399 Ohm
Lead Channel Impedance Value: 437 Ohm
Lead Channel Sensing Intrinsic Amplitude: 13.625 mV
Lead Channel Sensing Intrinsic Amplitude: 13.625 mV
Lead Channel Sensing Intrinsic Amplitude: 2.25 mV
Lead Channel Sensing Intrinsic Amplitude: 2.25 mV
Lead Channel Setting Pacing Amplitude: 1.5 V
Lead Channel Setting Pacing Amplitude: 2 V
Lead Channel Setting Pacing Pulse Width: 0.4 ms
Lead Channel Setting Sensing Sensitivity: 0.3 mV
Zone Setting Status: 755011

## 2022-03-24 NOTE — Progress Notes (Signed)
LAMOUNT, POPA ([id number]) [id number] Page 1 of 3 Visit Report for 03/23/2022 Arrival Information Details Patient Name: Date of Service: [name], [name] 03/23/2022 11:30 A M Medical Record Number: [id number] Patient Account Number: [id number] Date of Birth/Sex: Treating RN: Jun 11, 1955 (67 y.o. [name], [name] Primary Care [name]: [name] Other Clinician: Referring [place] Mcphee: Treating Ellowyn Rieves/Extender: Lucky Rathke in Treatment: 33 Visit Information History Since Last Visit Added or deleted any medications: No Patient Arrived: [name] Any new allergies or adverse reactions: No Arrival Time: 11:25 Had a fall or experienced change in No Accompanied By: self activities of daily living that may affect Transfer Assistance: None risk of falls: Patient Identification Verified: Yes Signs or symptoms of abuse/neglect since last visito No Secondary Verification Process Completed: Yes Hospitalized since last visit: No Patient Requires Transmission-Based Precautions: No Implantable device outside of the clinic excluding No Patient Has Alerts: Yes cellular tissue based products placed in the center Patient Alerts: Patient on Blood Thinner since last visit: 01/2021 ABI L 1.13 R 1.06 Has Dressing in Place as Prescribed: Yes 01/2021 TBI L amp R 0.8 Has Compression in Place as Prescribed: Yes Pain Present Now: No Electronic Signature(s) Signed: 03/23/2022 5:26:11 PM By: Deon Pilling RN, BSN Entered By: Deon Pilling on 03/23/2022 11:54:58 -------------------------------------------------------------------------------- Compression Therapy Details Patient Name: Date of Service: [name] Gloss D. 03/23/2022 11:30 A M Medical Record Number: [id number] Patient Account Number: [id number] Date of Birth/Sex: Treating RN: [birth year] (67 y.o. [name] Primary Care [name]: [name] Other Clinician: Referring [name]: Treating  [name]/Extender: Lucky Rathke in Treatment: 33 Compression Therapy Performed for Wound Assessment: Wound #7 Right,Medial Malleolus Performed By: Clinician Deon Pilling, RN Compression Type: Three Hydrologist) Signed: 03/23/2022 5:26:11 PM By: Deon Pilling RN, BSN Entered By: Deon Pilling on 03/23/2022 11:58:23 -------------------------------------------------------------------------------- Encounter Discharge Information Details Patient Name: Date of Service: [name], [name] 03/23/2022 11:30 A M Medical Record Number: [id number] Patient Account Number: [id number] Date of Birth/Sex: Treating RN: 1955-03-08 (67 y.o. [name] Primary Care Keon Pender: [name] Other Clinician: Referring Mancel Lardizabal: Treating Dynisha Due/Extender: Lucky Rathke in Treatment: 33 Encounter Discharge Information Items Discharge Condition: Stable Ambulatory Status: Walker Discharge Destination: Home Transportation: Other Accompanied By: self Schedule Follow-up Appointment: No Clinical Summary of CareMARQUET, LAURO ([id number]) [id number] Page 2 of 3 Electronic Signature(s) Signed: 03/23/2022 5:26:11 PM By: Deon Pilling RN, BSN Entered By: Deon Pilling on 03/23/2022 11:58:54 -------------------------------------------------------------------------------- Wound Assessment Details Patient Name: Date of Service: [name], [name] 03/23/2022 11:30 A M Medical Record Number: [id number] Patient Account Number: [id number] Date of Birth/Sex: Treating RN: 1955/02/14 (67 y.o. [name], [name] Primary Care Tommaso Cavitt: [name] Other Clinician: Referring Cay Kath: Treating Marja Adderley/Extender: Lucky Rathke in Treatment: 33 Wound Status Wound Number: 7 Primary Etiology: Venous Leg Ulcer Wound Location: Right, Medial Malleolus Secondary Etiology: Lymphedema Wounding Event: Blister Wound  Status: Open Date Acquired: 07/21/2021 Weeks Of Treatment: 33 Clustered Wound: No Wound Measurements Length: (cm) 4.2 Width: (cm) 1.8 Depth: (cm) 0.2 Area: (cm) 5.938 Volume: (cm) 1.188 % Reduction in Area: 84.5% % Reduction in Volume: 95.6% Wound Description Classification: Full Thickness With Exposed Support Exudate Amount: Medium Exudate Type: Serosanguineous Exudate Color: red, brown Structures Periwound Skin Texture Texture Color No Abnormalities Noted: No No Abnormalities Noted: No Moisture No Abnormalities Noted: No Treatment Notes Wound #7 (Malleolus) Wound Laterality: Right, Medial Cleanser Peri-Wound Care Triamcinolone 15 (g) Discharge Instruction: Use triamcinolone 15 (g) as  directed Sween Lotion (Moisturizing lotion) Discharge Instruction: Apply moisturizing lotion as directed Topical Primary Dressing apligraf #3 (left in place) Discharge Instruction: applied by Pria Klosinski. adaptic and steri-strips (left in place) Discharge Instruction: secured the apligraf. Secondary Dressing Woven Gauze Sponge, Non-Sterile 4x4 in Discharge Instruction: Apply over primary dressing as directed. Zetuvit Plus 4x8 in Discharge Instruction: Apply over primary dressing as directed. Secured With TRITON, ALTICE D ([id number]) [id number] Page 3 of 3 Compression Wrap ThreePress (3 layer compression wrap) Discharge Instruction: Apply three layer use kerlix instead of cotton compression as directed. Compression Stockings Add-Ons Electronic Signature(s) Signed: 03/23/2022 5:26:11 PM By: Deon Pilling RN, BSN Entered By: Deon Pilling on 03/23/2022 11:55:27

## 2022-03-25 ENCOUNTER — Other Ambulatory Visit (HOSPITAL_COMMUNITY): Payer: Self-pay

## 2022-03-25 MED ORDER — DAPAGLIFLOZIN PROPANEDIOL 10 MG PO TABS: 10.0000 mg | ORAL_TABLET | Freq: Every day | ORAL | 1 refills | Status: AC

## 2022-03-25 NOTE — Progress Notes (Signed)
AMIRALI, CANFIELD ([id number]) [id number] Page 1 of 1 Visit Report for 03/23/2022 SuperBill Details Patient Name: Date of Service: [name], [name] 03/23/2022 Medical Record Number: [id number] Patient Account Number: [id number] Date of Birth/Sex: Treating RN: July 08, 1955 (67 y.o. [name], [name] Primary Care Provider: [name] Other Clinician: Referring Provider: Treating Provider/Extender: Lucky Rathke in Treatment: 33 Diagnosis Coding ICD-10 Codes Code Description I89.0 Lymphedema, not elsewhere classified I87.333 Chronic venous hypertension (idiopathic) with ulcer and inflammation of bilateral lower extremity L97.828 Non-pressure chronic ulcer of other part of left lower leg with other specified severity L97.818 Non-pressure chronic ulcer of other part of right lower leg with other specified severity E11.621 Type 2 diabetes mellitus with foot ulcer L97.518 Non-pressure chronic ulcer of other part of right foot with other specified severity Facility Procedures CPT4 Code Description Modifier Quantity [id number] (Facility Use Only) [phone] - APPLY MULTLAY COMPRS LWR RT LEG 1 Electronic Signature(s) Signed: 03/23/2022 5:26:11 PM By: Deon Pilling RN, BSN Signed: 03/24/2022 3:34:03 PM By: Kalman Shan DO Entered By: Deon Pilling on 03/23/2022 11:59:03

## 2022-03-30 ENCOUNTER — Encounter (HOSPITAL_BASED_OUTPATIENT_CLINIC_OR_DEPARTMENT_OTHER): Payer: Medicare Other | Admitting: Physician Assistant

## 2022-03-30 DIAGNOSIS — M109 Gout, unspecified: Secondary | ICD-10-CM | POA: Diagnosis not present

## 2022-03-30 DIAGNOSIS — I872 Venous insufficiency (chronic) (peripheral): Secondary | ICD-10-CM | POA: Diagnosis not present

## 2022-03-30 DIAGNOSIS — E1151 Type 2 diabetes mellitus with diabetic peripheral angiopathy without gangrene: Secondary | ICD-10-CM | POA: Diagnosis not present

## 2022-03-30 DIAGNOSIS — L97312 Non-pressure chronic ulcer of right ankle with fat layer exposed: Secondary | ICD-10-CM | POA: Diagnosis not present

## 2022-03-30 DIAGNOSIS — L97828 Non-pressure chronic ulcer of other part of left lower leg with other specified severity: Secondary | ICD-10-CM | POA: Diagnosis not present

## 2022-03-30 DIAGNOSIS — I87333 Chronic venous hypertension (idiopathic) with ulcer and inflammation of bilateral lower extremity: Secondary | ICD-10-CM | POA: Diagnosis not present

## 2022-03-30 DIAGNOSIS — I890 Lymphedema, not elsewhere classified: Secondary | ICD-10-CM | POA: Diagnosis not present

## 2022-03-30 DIAGNOSIS — L97518 Non-pressure chronic ulcer of other part of right foot with other specified severity: Secondary | ICD-10-CM | POA: Diagnosis not present

## 2022-03-30 DIAGNOSIS — E11621 Type 2 diabetes mellitus with foot ulcer: Secondary | ICD-10-CM | POA: Diagnosis not present

## 2022-03-30 DIAGNOSIS — L97818 Non-pressure chronic ulcer of other part of right lower leg with other specified severity: Secondary | ICD-10-CM | POA: Diagnosis not present

## 2022-03-30 DIAGNOSIS — M1990 Unspecified osteoarthritis, unspecified site: Secondary | ICD-10-CM | POA: Diagnosis not present

## 2022-03-31 NOTE — Progress Notes (Addendum)
CATRELL, THORNBERRY ([id number]) [id number] Page 1 of 11 Visit Report for 03/30/2022 Chief Complaint Document Details Patient Name: Date of Service: [name] 03/30/2022 3:00 PM Medical Record Number: [id number] Patient Account Number: [id number] Date of Birth/Sex: Treating RN: Sep 28, 1955 (67 y.o. M) Primary Care Provider: [name] Other Clinician: Referring Provider: Treating Provider/Extender: [name] in [id number] Information Obtained from: Patient Chief Complaint 08/04/2021; patient returns to clinic with bilateral leg wounds as well as areas on the right foot Electronic Signature(s) Signed: 03/30/2022 3:23:42 PM By: [name] PA-C Entered By: [name] on 03/30/2022 15:23:41 -------------------------------------------------------------------------------- Cellular or Tissue Based Product Details Patient Name: Date of Service: [name]. 03/30/2022 3:00 PM Medical Record Number: [id number] Patient Account Number: [id number] Date of Birth/Sex: Treating RN: [name] Primary Care Provider: [name] Other Clinician: Referring Provider: Treating Provider/Extender: [name] in Treatment: [phone] Cellular or Tissue Based Product Type Wound #7 Right,Medial Malleolus Applied to: Performed By: Physician [name], PA Cellular or Tissue Based Product Type: Apligraf Level of Consciousness (Pre-procedure): Awake and Alert Pre-procedure Verification/Time Out Yes - 15:33 Taken: Location: trunk / arms / legs Wound Size (sq cm): 6.56 Product Size (sq cm): 44 Waste Size (sq cm): 22 Waste Reason: wound size Amount of Product Applied (sq cm): 22 Instrument Used: Forceps, Scissors Lot #: [id number] Order #: 4 Expiration Date: 04/06/2022 Fenestrated: Yes Instrument: Blade Reconstituted: Yes Solution Type: normal saline Solution Amount: 39mL Lot #:  [id number] Solution Expiration Date: 06/09/2024 Secured: Yes Secured With: Steri-Strips, adaptic Dressing Applied: No Procedural Pain: 0 Post Procedural Pain: 0 Response to Treatment: Procedure was tolerated well Level of Consciousness (Post- Awake and Alert procedure): Post Procedure Diagnosis Same as Pre-procedure Electronic Signature(s) Signed: 03/30/2022 5:04:23 PM By: Deon Pilling RN, BSN [place], [name] ([id number] Page 2 of 11 Signed: 03/30/2022 6:05:31 PM By: [name] PA-C Entered By: Deon Pilling on 03/30/2022 15:38:13 -------------------------------------------------------------------------------- Debridement Details Patient Name: Date of Service: [name], [name]. 03/30/2022 3:00 PM Medical Record Number: [id number] Patient Account Number: [id number] Date of Birth/Sex: Treating RN: Feb 18, 1955 (67 y.o. [name], [name] Primary Care Provider: [name] Other Clinician: Referring Provider: Treating Provider/Extender: [name] in Treatment: 34 Debridement Performed for Assessment: Wound #7 Right,Medial Malleolus Performed By: Physician [name], PA Debridement Type: Debridement Severity of Tissue Pre Debridement: Fat layer exposed Level of Consciousness (Pre-procedure): Awake and Alert Pre-procedure Verification/Time Out Yes - 15:25 Taken: Start Time: 15:26 Pain Control: Lidocaine 4% T opical Solution T Area Debrided (L x W): otal 4.1 (cm) x 1.6 (cm) = 6.56 (cm) Tissue and other material debrided: Viable, Non-Viable, Slough, Subcutaneous, Skin: Dermis , Skin: Epidermis, Slough Level: Skin/Subcutaneous Tissue Debridement Description: Excisional Instrument: Curette Bleeding: Minimum Hemostasis Achieved: Pressure End Time: 15:32 Procedural Pain: 0 Post Procedural Pain: 0 Response to Treatment: Procedure was tolerated well Level of Consciousness (Post- Awake and Alert procedure): Post  Debridement Measurements of Total Wound Length: (cm) 4.1 Width: (cm) 1.6 Depth: (cm) 0.2 Volume: (cm) 1.03 Character of Wound/Ulcer Post Debridement: Improved Severity of Tissue Post Debridement: Fat layer exposed Post Procedure Diagnosis Same as Pre-procedure Electronic Signature(s) Signed: 03/30/2022 5:04:23 PM By: Deon Pilling RN, BSN Signed: 03/30/2022 6:05:31 PM By: [name] PA-C Entered By: Deon Pilling on 03/30/2022 15:33:13 -------------------------------------------------------------------------------- HPI Details Patient Name: Date of Service: [name], [name]. 03/30/2022 3:00 PM Medical Record  Number: [id number] Patient Account Number: [id number] Date of Birth/Sex: Treating RN: [name] (67 y.o. M) Primary Care Provider: [name] Other Clinician: Referring Provider: Treating Provider/Extender: [name] in Treatment: 29 History of Present Illness HPI Description: ADMISSION 03/22/2021 This is a 67 year old man with a past medical history significant for diabetes type 2, congestive heart failure, peripheral arterial disease, morbid obesity, venous insufficiency, and coronary artery disease. He has been followed by Dr. Earleen [name] in podiatry, who performed a transmetatarsal amputation on the left foot in August 2022. He had issues healing that wound, but based upon Dr. Pasty [name] notes, ultimately the TMA wound healed. During his recovery from that surgery, however, ulcers opened up over the DIP joint of the right second and third toe. These have apparently closed and reopened multiple times. It sounds like one of the issues has been moisture accumulation and maceration of the tissues causing them to reopen. At his last visit with Dr. Earleen [name], on [name] ([id number]) [id number] Page 3 of 11 20, 2023, there continues to be problems with moisture and he was referred to wound care for further evaluation and  management. He had a formal aortogram with runoff performed prior to his TMA. The findings are copied here: Patient has inline flow to both feet with no significant flow-limiting lesion that would be amenable to percutaneous or open revascularization. He does have an element of small vessel disease and has a short segment occlusion of the distal anterior tibial/dorsalis pedis artery on the left foot but does have posterior tibial artery flow. Would recommend management of wounds with amputation of toes 2 and 3 on the right foot if the wounds do not heal and deteriorate. Transmetatarsal amputation on the left side has as good a blood supply as it is going to get and hopefully this will heal in the future. Formal ABIs were done in January 2023. They are normal bilaterally. ABI Findings: +---------+------------------+-----+----------+--------+ Right Rt Pressure (mmHg)IndexWaveform Comment  +---------+------------------+-----+----------+--------+ Brachial 160     +---------+------------------+-----+----------+--------+ PTA 192 1.06 monophasic  +---------+------------------+-----+----------+--------+ DP 159 0.88 monophasic  +---------+------------------+-----+----------+--------+ Great T oe145 0.80    +---------+------------------+-----+----------+--------+ +--------+------------------+-----+---------+-------+ Left Lt Pressure (mmHg)IndexWaveform Comment +--------+------------------+-----+---------+-------+ [id number]     +--------+------------------+-----+---------+-------+ PTA 204 1.13 triphasic  +--------+------------------+-----+---------+-------+ DP 194 1.07 biphasic   +--------+------------------+-----+---------+-------+ +-------+-----------+-----------+------------+------------+ ABI/TBIT oday's ABIT oday's TBIPrevious ABIPrevious TBI +-------+-----------+-----------+------------+------------+ Right 1.06 0.8 1.26 0.65   +-------+-----------+-----------+------------+------------+ Left 1.13 amputation 1.15 amputation  +-------+-----------+-----------+------------+------------+ Previous ABI on 08/06/20 at [facility] Pedal pressures falsely elevated due to medial calcification. Summary: Right: Resting right ankle-brachial index is within normal range. The right toe-brachial index is normal. Left: Resting left ankle-brachial index is within normal range. READMISSION 08/04/2021 Mr. Kohring is now a 67 year old man who I remember from this clinic many years ago I think he had a right lower extremity predominantly venous wound at the time. He was here for 1 visit in March of this year had wounds on his right second and third toes we apparently dressed them many and they healed so he did not come back. He is listed in [place] is being a diabetic although the patient denies this says he is verified it with his primary doctor. In any case over the last several weeks or so according the patient although these wounds look somewhat more chronic than that he has developed predominantly large wounds on the right medial and right lateral ankle smaller areas on the left leg and areas on the dorsal aspect of his right second  and third toes. Its not clear how he has been dressing these. 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 The patient has known PAD. He had an angiogram in August 2022 at which time he had nonhealing wounds in both feet. On the left his major vessels in the thigh were all patent. He had three-vessel patent to the level of the ankle. He had a very short occlusion in the left anterior tibial. On the right lower extremity the major vessels in his thigh were all patent. He had three-vessel runoff to the foot sluggish filling of the anterior tibial artery. He was felt to have some component of small vessel disease but nothing that  was amenable or needed revascularization. It was recommended that he have amputation of the second and third toes on the right foot if they did not heal He has been following with Dr. Earleen [name] of podiatry. Dr. Earleen [name] got him juxta lite stockings although I do not think he had them on properly he has uncontrolled edema in both legs Past medical history includes type 2 diabetes [although the patient really denies this], left TMA in 2022,lower extremity wounds in fact attendance at this clinic in 2009-2010, A-fib on Eliquis, chronic venous insufficiency with secondary lymphedema history of non-Hodgkin's lymphoma. 08-11-2021 upon evaluation today patient presents for follow-up evaluation he was seen last Wednesday initially for inspection here in our clinic. With that being said he tells me that he unfortunately has been having a lot of drainage and is actually coming through his wrap. Fortunately I do not see any evidence of active infection locally or systemically at this time which is great news. No fevers, chills, nausea, vomiting, or diarrhea. With that being said there does appear to be some evidence of local infection based on what I am seeing today. 08-18-2021 upon evaluation today patient appears to be doing okay currently in regard to his wounds with that being said that he is doing much better but still has a long ways to go to get to where he wanted to be. I think the infection is significantly improved. He has another week of the antibiotic at this point. 08-25-2021 upon evaluation today patient's wounds are actually doing decently well he has erythema has significantly improved. I think the cellulitis is under controlled I am going to continue him on 2 more weeks of the Levaquin at 500 mg this is a lower dose but I am hoping it will be better for him. 09-08-2021 upon evaluation today patient appears to be doing excellent in regard to his wounds. Since I last saw him he was actually in the hospital  where he ended up having a pacemaker put in. Subsequently he tells me that he is actually doing quite well although they were unsure whether they were going to do it due to the fact that he had the wounds on his legs. And then I am glad they did anything seem to be doing well. COURY, SCHOEPP ([id number]) [id number] Page 4 of 11 09-15-2021 upon evaluation patient's wounds are actually showing signs of improvement. The right side wounds do appear to have some need for sharp debridement today and I Georgina Peer go ahead and proceed with that. I think that if we get the wounds cleaned up he will actually show signs of continued improvement. I am also leaning towards switching to [facility] which I think will be a much better option  for him. 09-22-2021 patient's wounds are showing signs of excellent improvement. I am actually extremely pleased with where we stand and I think that the patient is making great progress. There does not appear to be any signs of active infection. 09-29-2021 upon evaluation today patient appears to be doing excellent in regard to his wounds. He is actually tolerating the dressing changes without complication. Fortunately I see no evidence of active infection locally or systemically at this time which is great news and overall I am extremely pleased with where we stand currently. 09-2721 upon evaluation today patient actually appears to be doing excellent in regard to his wounds. The left leg is almost completely healed the right ankle is significantly smaller. Overall I am extremely pleased with where we stand at this point. I do not see any evidence of active infection at this time. 10-13-2021 upon evaluation today patient appears to be doing excellent in regard to his wounds. I really feel like he is making good progress here and I am very pleased in that regard. Fortunately I do not see any signs of active infection at this time. We are using the [facility]  topical antibiotic therapy. 10-20-2021 upon evaluation today patient appears to be doing well with regard to his wound on the right medial ankle region the left leg is almost completely healed. I am actually very pleased with where we stand today. 11-03-2021 upon evaluation today patient's wound actually is going require some sharp debridement but appears to be doing much better which is great news. Fortunately I do not see any signs of active infection at this time. 11-10-2021 upon evaluation today patient's wound is actually showing signs of excellent improvement. Fortunately I do not see any evidence of infection locally or systemically which is great news and overall I am extremely pleased with where we stand today. I do believe he is making good progress he does have his [facility] topical antibiotics with him here today. 11-24-2021 upon evaluation today patient actually showing signs of excellent improvement this appears to be doing much better. Fortunately I do not see any evidence of infection locally or systemically at this time. 12-01-2021 upon evaluation today patient appears to be doing well currently in regard to his wound. He has been tolerating the dressing changes without complication. Fortunately I do not see any evidence of active infection at this time which is great news and overall I am extremely pleased with where we stand today. 12-15-2021 upon evaluation today patient appears to be doing well with regard to his wound. He is showing signs of improvement is slow but nonetheless we are making improvements here. Fortunately I do not see any evidence of active infection locally nor systemically at this time. We are still using the [facility] topical antibiotic over the open area only. 12-29-2021 upon evaluation today patient's wound actually is showing signs of excellent improvement. It has been 2 weeks since I perform any debridement and it definitely shows he has some tissue that  needs to be cleaned away but I think we can do so quite easily and readily today. The good news is I do not see any signs of infection I think he is doing much better in that regard. Overall I am extremely pleased with where we stand. 01-12-2022 upon evaluation today patient appears to be doing well currently in regard to his wound although it is not getting significantly smaller it is also not getting any larger. Fortunately I do not see any evidence of  infection locally nor systemically which is great news and overall I am extremely pleased with where things stand currently. 01-19-2022 upon evaluation today patient appears to be doing well currently in regard to his wound. The PolyMem actually seems to have done extremely well for him. Fortunately I do not see any signs of infection locally nor systemically at this time. 01-26-2022 upon evaluation today patient appears to be making progress. Fortunately there does not appear to be any signs of infection which is great news. No fevers, chills, nausea, vomiting, or diarrhea. With that being said this is very slow to heal and although it is smaller I still feel like we may want to try to do something to speed this up I think that a skin substitute could be beneficial, look into Kerecis. 02-02-2022 upon evaluation today patient's wound is actually showing signs of excellent improvement. I do not see any evidence of infection and overall I think that we are headed in the right direction. Fortunately I think that he is tolerating the dressing changes without complication. 02-09-2022 upon evaluation today patient appears to be doing well currently in regard to his wound. It does look a little bit macerated but fortunately does not appear to be showing any signs of significant skin breakdown over the macerated area which is good news. Fortunately I do not see any evidence of active infection locally nor systemically at this point which is great news. 02-23-2022  upon evaluation today patient appears to be doing well currently in regard to his wounds. In fact his area on the ankle is actually showing signs of healing quite nicely. We do have the Apligraf ready today I am hopeful this is going to speed things up and get this closed much more effectively and quickly. Fortunately I do not see any signs of active infection locally nor systemically at this time which is great news. 03-02-2022 upon evaluation today patient appears to be doing excellent in regard to his wound. He is actually been tolerating the dressing changes without complication the wound on the left medial lower extremity is doing quite well and Apligraf seems to have been extremely beneficial for him. I am extremely pleased with where we stand today. 03/16/2022: The wound measurements are smaller today. He has accumulation of a yellow crust around the edges and slough on the surface. There is a musty odor coming from the wound. He has been getting Apligraf. 03-30-2022 upon evaluation today patient appears to be making progress. In regard to his leg ulcer. This is slow but nonetheless the Apligraf has fed things up. Fortunately I do not see any evidence of infection locally nor systemically at this time. No fevers, chills, nausea, vomiting, or diarrhea. Patient is here for Apligraf #4 today. Electronic Signature(s) Signed: 03/30/2022 5:43:16 PM By: [name] PA-C Entered By: [name] on 03/30/2022 17:43:16 -------------------------------------------------------------------------------- Physical Exam Details Patient Name: Date of Service: [name], [name]. 03/30/2022 3:00 PM Medical Record Number: [id number] Patient Account Number: [id number] Date of Birth/Sex: Treating RN: [birth year] (67 y.o. M) Primary Care Provider: [name] Other Clinician: TRITAN, RIEGSECKER [name] ([id number]) [id number] Page 5 of 11 Referring Provider: Treating Provider/Extender: [name] in Treatment: 78 Constitutional Well-nourished and well-hydrated in no acute distress. Respiratory normal breathing without difficulty. Psychiatric this patient is able to make decisions and demonstrates good insight into disease process. Alert and Oriented x 3. pleasant and cooperative. Notes Upon inspection patient's wound bed actually began did show  signs of good granulation epithelization at this point. He does still have some maceration around the edges of the wound I am going to try to use Sorbact today in place of the Adaptic to see how that we will do for him. He is in agreement with this. Electronic Signature(s) Signed: 03/30/2022 5:43:35 PM By: [name] PA-C Entered By: [name] on 03/30/2022 17:43:35 -------------------------------------------------------------------------------- Physician Orders Details Patient Name: Date of Service: [name], [name]. 03/30/2022 3:00 PM Medical Record Number: [id number] Patient Account Number: [id number] Date of Birth/Sex: Treating RN: 21-Jan-1955 (67 y.o. [name], [name] Primary Care Provider: [name] Other Clinician: Referring Provider: Treating Provider/Extender: [name] in Treatment: [phone] Verbal / Phone Orders: No Diagnosis Coding ICD-10 Coding Code Description I89.0 Lymphedema, not elsewhere classified I87.333 Chronic venous hypertension (idiopathic) with ulcer and inflammation of bilateral lower extremity L97.828 Non-pressure chronic ulcer of other part of left lower leg with other specified severity L97.818 Non-pressure chronic ulcer of other part of right lower leg with other specified severity E11.621 Type 2 diabetes mellitus with foot ulcer L97.518 Non-pressure chronic ulcer of other part of right foot with other specified severity Follow-up Appointments ppointment in 2 weeks. Margarita Grizzle Wednesday 04/13/2022 3pm room 9 Return A Return appointment in 3 weeks. Margarita Grizzle Wednesday 04/20/2022 3pm room 9 Return appointment in 1 month. Margarita Grizzle Wednesday 04/27/2022 3pm room 9 Nurse Visit: Margarita Grizzle 04/06/2022 3pm Anesthetic (In clinic) Topical Lidocaine 5% applied to wound bed Cellular or Tissue Based Products Cellular or Tissue Based Product Type: - Run IVR for Kerrecis- denied #1 Apligraf applied 02/23/2022 #2 Apligraf applied 03/02/2022 #3 Apligraf applied 03/16/2022 #4 Apligraf applied 03/30/2022 Cellular or Tissue Based Product applied to wound bed, secured with steri-strips, cover with Adaptic or Mepitel. (DO NOT REMOVE). Bathing/ Shower/ Hygiene May shower with protection but do not get wound dressing(s) wet. Protect dressing(s) with water repellant cover (for example, large plastic bag) or a cast cover and may then take shower. Edema Control - Lymphedema / SCD / Other Elevate legs to the level of the heart or above for 30 minutes daily and/or when sitting for 3-4 times a day throughout the day. Avoid standing for long periods of time. Exercise regularly Moisturize legs daily. - apply every night before bed to left leg. Compression stocking or Garment 30-40 mm/Hg pressure to: - wear your VIVE compression stocking to left leg. apply in the morning and remove at night. Will apply tubigrip size [name] to left leg. Remove once you get home and use your compression stockings. Wound Treatment Wound #7 - Malleolus Wound Laterality: Right, Medial [name] [name] ([id number] Page 6 of 11 Peri-Wound Care: Triamcinolone 15 (g) 1 x Per Week/30 Days Discharge Instructions: Use triamcinolone 15 (g) as directed Peri-Wound Care: Sween Lotion (Moisturizing lotion) 1 x Per Week/30 Days Discharge Instructions: Apply moisturizing lotion as directed Prim Dressing: apligraf #4 1 x Per Week/30 Days ary Discharge Instructions: applied by provider. Prim Dressing: adaptic and steri-strips 1 x Per Week/30 Days ary Discharge Instructions: secured  the apligraf. Secondary Dressing: Woven Gauze Sponge, Non-Sterile 4x4 in 1 x Per Week/30 Days Discharge Instructions: Apply over primary dressing as directed. Secondary Dressing: Zetuvit Plus 4x8 in 1 x Per Week/30 Days Discharge Instructions: Apply over primary dressing as directed. Compression Wrap: ThreePress (3 layer compression wrap) 1 x Per Week/30 Days Discharge Instructions: Apply three layer use kerlix instead of cotton compression as directed. Electronic Signature(s) Signed: 03/30/2022  5:04:23 PM By: Deon Pilling RN, BSN Signed: 03/30/2022 6:05:31 PM By: [name] PA-C Entered By: Deon Pilling on 03/30/2022 15:41:40 -------------------------------------------------------------------------------- Problem List Details Patient Name: Date of Service: [name], [name]. 03/30/2022 3:00 PM Medical Record Number: [id number] Patient Account Number: [id number] Date of Birth/Sex: Treating RN: 1955-07-23 (67 y.o. [name], [name] Primary Care Provider: [name] Other Clinician: Referring Provider: Treating Provider/Extender: [name] in Treatment: 34 Active Problems ICD-10 Encounter Code Description Active Date MDM Diagnosis I89.0 Lymphedema, not elsewhere classified 08/04/2021 No Yes I87.333 Chronic venous hypertension (idiopathic) with ulcer and inflammation of 08/04/2021 No Yes bilateral lower extremity L97.828 Non-pressure chronic ulcer of other part of left lower leg with other specified 08/04/2021 No Yes severity L97.818 Non-pressure chronic ulcer of other part of right lower leg with other specified 08/04/2021 No Yes severity E11.621 Type 2 diabetes mellitus with foot ulcer 08/04/2021 No Yes L97.518 Non-pressure chronic ulcer of other part of right foot with other specified 08/04/2021 No Yes severity Inactive Problems [name] ([id number] Page 7 of 11 Resolved Problems Electronic  Signature(s) Signed: 03/30/2022 3:19:47 PM By: [name] PA-C Entered By: [name] on 03/30/2022 15:19:46 -------------------------------------------------------------------------------- Progress Note Details Patient Name: Date of Service: [name], [name]. 03/30/2022 3:00 PM Medical Record Number: [id number] Patient Account Number: [id number] Date of Birth/Sex: Treating RN: [birth year] (67 y.o. M) Primary Care Provider: [name] Other Clinician: Referring Provider: Treating Provider/Extender: [name] in Treatment: 46 Subjective Chief Complaint Information obtained from Patient 08/04/2021; patient returns to clinic with bilateral leg wounds as well as areas on the right foot History of Present Illness (HPI) ADMISSION 03/22/2021 This is a 67 year old man with a past medical history significant for diabetes type 2, congestive heart failure, peripheral arterial disease, morbid obesity, venous insufficiency, and coronary artery disease. He has been followed by Dr. Earleen [name] in podiatry, who performed a transmetatarsal amputation on the left foot in August 2022. He had issues healing that wound, but based upon Dr. Pasty [name] notes, ultimately the TMA wound healed. During his recovery from that surgery, however, ulcers opened up over the DIP joint of the right second and third toe. These have apparently closed and reopened multiple times. It sounds like one of the issues has been moisture accumulation and maceration of the tissues causing them to reopen. At his last visit with Dr. Earleen [name], on [name] 20, 2023, there continues to be problems with moisture and he was referred to wound care for further evaluation and management. He had a formal aortogram with runoff performed prior to his TMA. The findings are copied here: Patient has inline flow to both feet with no significant flow-limiting lesion that would be amenable to percutaneous or open revascularization.  He does have an element of small vessel disease and has a short segment occlusion of the distal anterior tibial/dorsalis pedis artery on the left foot but does have posterior tibial artery flow. Would recommend management of wounds with amputation of toes 2 and 3 on the right foot if the wounds do not heal and deteriorate. Transmetatarsal amputation on the left side has as good a blood supply as it is going to get and hopefully this will heal in the future. Formal ABIs were done in January 2023. They are normal bilaterally. ABI Findings: +---------+------------------+-----+----------+--------+ Right Rt Pressure (mmHg)IndexWaveform Comment  +---------+------------------+-----+----------+--------+ Brachial 160    +---------+------------------+-----+----------+--------+ PTA 192 1.06 monophasic  +---------+------------------+-----+----------+--------+ DP 159 0.88 monophasic  +---------+------------------+-----+----------+--------+  Great T [id number] 0.80    +---------+------------------+-----+----------+--------+ +--------+------------------+-----+---------+-------+ Left Lt Pressure (mmHg)IndexWaveform Comment +--------+------------------+-----+---------+-------+ [id number]    +--------+------------------+-----+---------+-------+ PTA 204 1.13 triphasic  +--------+------------------+-----+---------+-------+ DP 194 1.07 biphasic   +--------+------------------+-----+---------+-------+ +-------+-----------+-----------+------------+------------+ ABI/TBIT oday's ABIT oday's TBIPrevious ABIPrevious TBI +-------+-----------+-----------+------------+------------+ Right 1.06 0.8 1.26 0.65  +-------+-----------+-----------+------------+------------+ Left 1.13 amputation 1.15 amputation  +-------+-----------+-----------+------------+------------+ Previous ABI on 08/06/20 at [facility] Pedal pressures falsely elevated due to medial  calcification. Summary: Right: Resting right ankle-brachial index is within normal range. The right toe-brachial index is normal. [name] ([id number]) [id number] Page 8 of 11 Left: Resting left ankle-brachial index is within normal range. READMISSION 08/04/2021 Mr. Leisner is now a 67 year old man who I remember from this clinic many years ago I think he had a right lower extremity predominantly venous wound at the time. He was here for 1 visit in March of this year had wounds on his right second and third toes we apparently dressed them many and they healed so he did not come back. He is listed in [place] is being a diabetic although the patient denies this says he is verified it with his primary doctor. In any case over the last several weeks or so according the patient although these wounds look somewhat more chronic than that he has developed predominantly large wounds on the right medial and right lateral ankle smaller areas on the left leg and areas on the dorsal aspect of his right second and third toes. Its not clear how he has been dressing these. 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 The patient has known PAD. He had an angiogram in August 2022 at which time he had nonhealing wounds in both feet. On the left his major vessels in the thigh were all patent. He had three-vessel patent to the level of the ankle. He had a very short occlusion in the left anterior tibial. On the right lower extremity the major vessels in his thigh were all patent. He had three-vessel runoff to the foot sluggish filling of the anterior tibial artery. He was felt to have some component of small vessel disease but nothing that was amenable or needed revascularization. It was recommended that he have amputation of the second and third toes on the right foot if they did not heal He has been following  with Dr. Earleen [name] of podiatry. Dr. Earleen [name] got him juxta lite stockings although I do not think he had them on properly he has uncontrolled edema in both legs Past medical history includes type 2 diabetes [although the patient really denies this], left TMA in 2022,lower extremity wounds in fact attendance at this clinic in 2009-2010, A-fib on Eliquis, chronic venous insufficiency with secondary lymphedema history of non-Hodgkin's lymphoma. 08-11-2021 upon evaluation today patient presents for follow-up evaluation he was seen last Wednesday initially for inspection here in our clinic. With that being said he tells me that he unfortunately has been having a lot of drainage and is actually coming through his wrap. Fortunately I do not see any evidence of active infection locally or systemically at this time which is great news. No fevers, chills, nausea, vomiting, or diarrhea. With that being said there does appear to be some evidence of local infection based on what I am seeing today. 08-18-2021 upon evaluation today patient appears to be doing okay currently in regard to his wounds with that being said that he is doing much better but still  has a long ways to go to get to where he wanted to be. I think the infection is significantly improved. He has another week of the antibiotic at this point. 08-25-2021 upon evaluation today patient's wounds are actually doing decently well he has erythema has significantly improved. I think the cellulitis is under controlled I am going to continue him on 2 more weeks of the Levaquin at 500 mg this is a lower dose but I am hoping it will be better for him. 09-08-2021 upon evaluation today patient appears to be doing excellent in regard to his wounds. Since I last saw him he was actually in the hospital where he ended up having a pacemaker put in. Subsequently he tells me that he is actually doing quite well although they were unsure whether they were going to do it due to the  fact that he had the wounds on his legs. And then I am glad they did anything seem to be doing well. 09-15-2021 upon evaluation patient's wounds are actually showing signs of improvement. The right side wounds do appear to have some need for sharp debridement today and I Georgina Peer go ahead and proceed with that. I think that if we get the wounds cleaned up he will actually show signs of continued improvement. I am also leaning towards switching to [facility] which I think will be a much better option for him. 09-22-2021 patient's wounds are showing signs of excellent improvement. I am actually extremely pleased with where we stand and I think that the patient is making great progress. There does not appear to be any signs of active infection. 09-29-2021 upon evaluation today patient appears to be doing excellent in regard to his wounds. He is actually tolerating the dressing changes without complication. Fortunately I see no evidence of active infection locally or systemically at this time which is great news and overall I am extremely pleased with where we stand currently. 09-2721 upon evaluation today patient actually appears to be doing excellent in regard to his wounds. The left leg is almost completely healed the right ankle is significantly smaller. Overall I am extremely pleased with where we stand at this point. I do not see any evidence of active infection at this time. 10-13-2021 upon evaluation today patient appears to be doing excellent in regard to his wounds. I really feel like he is making good progress here and I am very pleased in that regard. Fortunately I do not see any signs of active infection at this time. We are using the [facility] topical antibiotic therapy. 10-20-2021 upon evaluation today patient appears to be doing well with regard to his wound on the right medial ankle region the left leg is almost completely healed. I am actually very pleased with where we stand today. 11-03-2021  upon evaluation today patient's wound actually is going require some sharp debridement but appears to be doing much better which is great news. Fortunately I do not see any signs of active infection at this time. 11-10-2021 upon evaluation today patient's wound is actually showing signs of excellent improvement. Fortunately I do not see any evidence of infection locally or systemically which is great news and overall I am extremely pleased with where we stand today. I do believe he is making good progress he does have his [facility] topical antibiotics with him here today. 11-24-2021 upon evaluation today patient actually showing signs of excellent improvement this appears to be doing much better. Fortunately I do not see any evidence  of infection locally or systemically at this time. 12-01-2021 upon evaluation today patient appears to be doing well currently in regard to his wound. He has been tolerating the dressing changes without complication. Fortunately I do not see any evidence of active infection at this time which is great news and overall I am extremely pleased with where we stand today. 12-15-2021 upon evaluation today patient appears to be doing well with regard to his wound. He is showing signs of improvement is slow but nonetheless we are making improvements here. Fortunately I do not see any evidence of active infection locally nor systemically at this time. We are still using the [facility] topical antibiotic over the open area only. 12-29-2021 upon evaluation today patient's wound actually is showing signs of excellent improvement. It has been 2 weeks since I perform any debridement and it definitely shows he has some tissue that needs to be cleaned away but I think we can do so quite easily and readily today. The good news is I do not see any signs of infection I think he is doing much better in that regard. Overall I am extremely pleased with where we stand. 01-12-2022 upon evaluation  today patient appears to be doing well currently in regard to his wound although it is not getting significantly smaller it is also not getting any larger. Fortunately I do not see any evidence of infection locally nor systemically which is great news and overall I am extremely pleased with where things stand currently. 01-19-2022 upon evaluation today patient appears to be doing well currently in regard to his wound. The PolyMem actually seems to have done extremely well for him. Fortunately I do not see any signs of infection locally nor systemically at this time. 01-26-2022 upon evaluation today patient appears to be making progress. Fortunately there does not appear to be any signs of infection which is great news. No fevers, chills, nausea, vomiting, or diarrhea. With that being said this is very slow to heal and although it is smaller I still feel like we may want to try to do something to speed this up I think that a skin substitute could be beneficial, look into Kerecis. [name] ([id number]) [id number] Page 9 of 11 02-02-2022 upon evaluation today patient's wound is actually showing signs of excellent improvement. I do not see any evidence of infection and overall I think that we are headed in the right direction. Fortunately I think that he is tolerating the dressing changes without complication. 02-09-2022 upon evaluation today patient appears to be doing well currently in regard to his wound. It does look a little bit macerated but fortunately does not appear to be showing any signs of significant skin breakdown over the macerated area which is good news. Fortunately I do not see any evidence of active infection locally nor systemically at this point which is great news. 02-23-2022 upon evaluation today patient appears to be doing well currently in regard to his wounds. In fact his area on the ankle is actually showing signs of healing quite nicely. We do have  the Apligraf ready today I am hopeful this is going to speed things up and get this closed much more effectively and quickly. Fortunately I do not see any signs of active infection locally nor systemically at this time which is great news. 03-02-2022 upon evaluation today patient appears to be doing excellent in regard to his wound. He is actually been tolerating the dressing changes without complication the wound on  the left medial lower extremity is doing quite well and Apligraf seems to have been extremely beneficial for him. I am extremely pleased with where we stand today. 03/16/2022: The wound measurements are smaller today. He has accumulation of a yellow crust around the edges and slough on the surface. There is a musty odor coming from the wound. He has been getting Apligraf. 03-30-2022 upon evaluation today patient appears to be making progress. In regard to his leg ulcer. This is slow but nonetheless the Apligraf has fed things up. Fortunately I do not see any evidence of infection locally nor systemically at this time. No fevers, chills, nausea, vomiting, or diarrhea. Patient is here for Apligraf #4 today. Objective Constitutional Well-nourished and well-hydrated in no acute distress. Vitals Time Taken: 3:09 PM, Height: 71 in, Weight: 350 lbs, BMI: 48.8, Temperature: 97.9 F, Pulse: 70 bpm, Respiratory Rate: 18 breaths/min, Blood Pressure: 141/91 mmHg. Respiratory normal breathing without difficulty. Psychiatric this patient is able to make decisions and demonstrates good insight into disease process. Alert and Oriented x 3. pleasant and cooperative. General Notes: Upon inspection patient's wound bed actually began did show signs of good granulation epithelization at this point. He does still have some maceration around the edges of the wound I am going to try to use Sorbact today in place of the Adaptic to see how that we will do for him. He is in agreement with this. Integumentary  (Hair, Skin) Wound #7 status is Open. Original cause of wound was Blister. The date acquired was: 07/21/2021. The wound has been in treatment 34 weeks. The wound is located on the Right,Medial Malleolus. The wound measures 4.1cm length x 1.6cm width x 0.2cm depth; 5.152cm^2 area and 1.03cm^3 volume. There is no tunneling or undermining noted. There is a medium amount of serosanguineous drainage noted. There is large (67-100%) red granulation within the wound bed. There is no necrotic tissue within the wound bed. The periwound skin appearance did not exhibit: Callus, Crepitus, Excoriation, Induration, Rash, Scarring, Dry/Scaly, Maceration, Atrophie Blanche, Cyanosis, Ecchymosis, Hemosiderin Staining, Mottled, Pallor, Rubor, Erythema. 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 There was a Excisional Skin/Subcutaneous Tissue Debridement with a total area of 6.56 sq cm performed by [name], PA. With the following instrument(s): Curette to remove Viable and Non-Viable tissue/material. Material removed includes Subcutaneous Tissue, Slough, Skin: Dermis, and Skin: Epidermis after achieving pain control using Lidocaine 4% Topical Solution. A time out was conducted at 15:25, prior to the start of the procedure. A Minimum amount of bleeding was controlled with Pressure. The procedure was tolerated well  with a pain level of 0 throughout and a pain level of 0 following the procedure. Post Debridement Measurements: 4.1cm length x 1.6cm width x 0.2cm depth; 1.03cm^3 volume. Character of Wound/Ulcer Post Debridement is improved. Severity of Tissue Post Debridement is: Fat layer exposed. SHARIFF, CORNEAU ([id number]) [id number] Page 10 of 11 Post procedure Diagnosis Wound #7: Same as Pre-Procedure Pre-procedure diagnosis of Wound #7 is  a Venous Leg Ulcer located on the Right,Medial Malleolus. A skin graft procedure using a bioengineered skin substitute/cellular or tissue based product was performed by [name], PA with the following instrument(s): Forceps and Scissors. Apligraf was applied and secured with Steri-Strips and adaptic. 22 sq cm of product was utilized and 22 sq cm was wasted due to wound size. Post Application, no dressing was applied. A Time Out was conducted at 15:33, prior to the start of the procedure. The procedure was tolerated well with a pain level of 0 throughout and a pain level of 0 following the procedure. Post procedure Diagnosis Wound #7: Same as Pre-Procedure . Pre-procedure diagnosis of Wound #7 is a Venous Leg Ulcer located on the Right,Medial Malleolus . There was a Three Layer Compression Therapy Procedure by Deon Pilling, RN. Post procedure Diagnosis Wound #7: Same as Pre-Procedure Plan Follow-up Appointments: Return Appointment in 2 weeks. Margarita Grizzle Wednesday 04/13/2022 3pm room 9 Return appointment in 3 weeks. Margarita Grizzle Wednesday 04/20/2022 3pm room 9 Return appointment in 1 month. Margarita Grizzle Wednesday 04/27/2022 3pm room 9 Nurse Visit: Margarita Grizzle 04/06/2022 3pm Anesthetic: (In clinic) Topical Lidocaine 5% applied to wound bed Cellular or Tissue Based Products: Cellular or Tissue Based Product Type: - Run IVR for Kerrecis- denied #1 Apligraf applied 02/23/2022 #2 Apligraf applied 03/02/2022 #3 Apligraf applied 03/16/2022 #4 Apligraf applied  03/30/2022 Cellular or Tissue Based Product applied to wound bed, secured with steri-strips, cover with Adaptic or Mepitel. (DO NOT REMOVE). Bathing/ Shower/ Hygiene: May shower with protection but do not get wound dressing(s) wet. Protect dressing(s) with water repellant cover (for example, large plastic bag) or a cast cover and may then take shower. Edema Control - Lymphedema / SCD / Other: Elevate legs to the level of the heart or above for 30 minutes daily and/or when sitting for 3-4 times a day throughout the day. Avoid standing for long periods of time. Exercise regularly Moisturize legs daily. - apply every night before bed to left leg. Compression stocking or Garment 30-40 mm/Hg pressure to: - wear your VIVE compression stocking to left leg. apply in the morning and remove at night. Will apply tubigrip size [name] to left leg. Remove once you get home and use your compression stockings. WOUND #7: - Malleolus Wound Laterality: Right, Medial Peri-Wound Care: Triamcinolone 15 (g) 1 x Per Week/30 Days Discharge Instructions: Use triamcinolone 15 (g) as directed Peri-Wound Care: Sween Lotion (Moisturizing lotion) 1 x Per Week/30 Days Discharge Instructions: Apply moisturizing lotion as directed Prim Dressing: apligraf #4 1 x Per Week/30 Days ary Discharge Instructions: applied by provider. Prim Dressing: adaptic and steri-strips 1 x Per Week/30 Days ary Discharge Instructions: secured the apligraf. Secondary Dressing: Woven Gauze Sponge, Non-Sterile 4x4 in 1 x Per Week/30 Days Discharge Instructions: Apply over primary dressing as directed. Secondary Dressing: Zetuvit Plus 4x8 in 1 x Per Week/30 Days Discharge Instructions: Apply over primary dressing as directed. Com pression Wrap: ThreePress (3 layer compression wrap) 1 x Per Week/30 Days Discharge Instructions: Apply three layer use kerlix instead of cotton compression as directed. 1. I am good recommend currently that we go ahead and  reapply the Apligraf I did that after a sharp debridement today clearway the necrotic debris patient tolerated this without complication and postdebridement this is significantly improved. 2. I am also can recommend that we have the patient continue with the 3 layer compression wrap which I think is doing a good job. 3. I am also can recommend that  we continue to have a nurse visit in between I will see him in 2 weeks for the last application of Apligraf we will see where things stand at that point. We will see patient back for reevaluation in 1 week here in the clinic. If anything worsens or changes patient will contact our office for additional recommendations. Electronic Signature(s) Signed: 03/30/2022 5:44:05 PM By: [name] PA-C Entered By: [name] on 03/30/2022 17:44:05 -------------------------------------------------------------------------------- SuperBill Details Patient Name: Date of Service: Pla, [name]. 03/30/2022 Medical Record Number: [id number] Patient Account Number: [id number] Date of Birth/Sex: Treating RN: December 04, 1955 (67 y.o. Hessie [name] Primary Care Provider: [name] Other Clinician: Referring Provider: Treating Provider/Extender: [name] in Treatment: [address] [place] [name] ([id number]) [id number] Page 11 of 11 Diagnosis Coding ICD-10 Codes Code Description I89.0 Lymphedema, not elsewhere classified I87.333 Chronic venous hypertension (idiopathic) with ulcer and inflammation of bilateral lower extremity L97.828 Non-pressure chronic ulcer of other part of left lower leg with other specified severity L97.818 Non-pressure chronic ulcer of other part of right lower leg with other specified severity E11.621 Type 2 diabetes mellitus with foot ulcer L97.518 Non-pressure chronic ulcer of other part of right foot with other specified severity Facility Procedures : 6 CPT4 Code:  [id number] Description: (Facility Use Only) Apligraf 1 SQ CM Modifier: Quantity: 44 : 3 CPT4 Code: [id number] Description: [id number] - SKIN SUB GRAFT TRNK/ARM/LEG ICD-10 Diagnosis Description L97.818 Non-pressure chronic ulcer of other part of right lower leg with other specified Modifier: severity Quantity: 1 Physician Procedures : CPT4 Code Description Modifier [id number] - WC PHYS SKIN SUB GRAFT TRNK/ARM/LEG ICD-10 Diagnosis Description L97.818 Non-pressure chronic ulcer of other part of right lower leg with other specified severity Quantity: 1 Electronic Signature(s) Signed: 03/30/2022 5:44:17 PM By: [name] PA-C Previous Signature: 03/30/2022 5:04:23 PM Version By: Deon Pilling RN, BSN Entered By: [name] on 03/30/2022 17:44:16

## 2022-04-04 NOTE — Progress Notes (Signed)
ELBER, GOSCINSKI ([id number]) [id number] Page 1 of 5 Visit Report for 03/30/2022 Arrival Information Details Patient Name: Date of Service: [name], [name] 03/30/2022 3:00 PM Medical Record Number: [id number] Patient Account Number: [id number] Date of Birth/Sex: Treating RN: March 31, 1955 (67 y.o. M) Primary Care Herberto Ledwell: [name] Other Clinician: Referring Shantae Vantol: Treating Damyah Gugel/Extender: [name] in Treatment: 79 Visit Information History Since Last Visit Added or deleted any medications: No Patient Arrived: Gilford Rile Any new allergies or adverse reactions: No Arrival Time: 15:08 Had a fall or experienced change in No Accompanied By: self activities of daily living that may affect Transfer Assistance: None risk of falls: Patient Identification Verified: Yes Signs or symptoms of abuse/neglect since last visito No Secondary Verification Process Completed: Yes Hospitalized since last visit: No Patient Requires Transmission-Based Precautions: No Implantable device outside of the clinic excluding No Patient Has Alerts: Yes cellular tissue based products placed in the center Patient Alerts: Patient on Blood Thinner since last visit: 01/2021 ABI L 1.13 R 1.06 Has Dressing in Place as Prescribed: Yes 01/2021 TBI L amp R 0.8 Has Compression in Place as Prescribed: Yes Pain Present Now: No Electronic Signature(s) Signed: 04/04/2022 4:16:21 PM By: Erenest Blank Entered By: Erenest Blank on 03/30/2022 15:09:49 -------------------------------------------------------------------------------- Compression Therapy Details Patient Name: Date of Service: [name], [name] [name] 03/30/2022 3:00 PM Medical Record Number: [id number] Patient Account Number: [id number] Date of Birth/Sex: Treating RN: July 02, 1955 (67 y.o. [name] Primary Care Najma Bozarth: [name] Other Clinician: Referring Shaasia Odle: Treating Sara Keys/Extender: [name] in Treatment: 34 Compression Therapy Performed for Wound Assessment: Wound #7 Right,Medial Malleolus Performed By: Clinician Deon Pilling, RN Compression Type: Three Layer Post Procedure Diagnosis Same as Pre-procedure Electronic Signature(s) Signed: 03/30/2022 5:04:23 PM By: Deon Pilling RN, BSN Entered By: Deon Pilling on 03/30/2022 15:34:02 -------------------------------------------------------------------------------- Encounter Discharge Information Details Patient Name: Date of Service: [name], [name] 03/30/2022 3:00 PM Medical Record Number: [id number] Patient Account Number: [id number] Date of Birth/Sex: Treating RN: 1955-07-01 (67 y.o. [name] Primary Care Kimberleigh Mehan: [name] Other Clinician: Referring Terriyah Westra: Treating Kolbie Clarkston/Extender: [name] in Treatment: [phone] Encounter Discharge Information Items Post Procedure Vitals Discharge Condition: Stable Temperature (F): 97.9 Ambulatory Status: Walker Pulse (bpm): 70 Discharge Destination: Home Respiratory Rate (breaths/min): 20 Transportation: Private Auto Blood Pressure (mmHg): 141/91 BURLEIGH, PLASS D ([id number]) [id number] Page 2 of 5 Accompanied By: friend Schedule Follow-up Appointment: Yes Clinical Summary of Care: Electronic Signature(s) Signed: 03/30/2022 5:04:23 PM By: Deon Pilling RN, BSN Entered By: Deon Pilling on 03/30/2022 15:42:55 -------------------------------------------------------------------------------- Lower Extremity Assessment Details Patient Name: Date of Service: [name], [name] 03/30/2022 3:00 PM Medical Record Number: [id number] Patient Account Number: [id number] Date of Birth/Sex: Treating RN: 01/02/1956 (67 y.o. M) Primary Care Harry Shuck: [name] Other Clinician: Referring Goebel Hellums: Treating Garnie Borchardt/Extender: [name] in Treatment: 34 Edema Assessment Assessed:  [Left: No] [Right: No] Edema: [Left: N] [Right: o] Calf Left: Right: Point of Measurement: 31 cm From Medial Instep 40.5 cm Ankle Left: Right: Point of Measurement: 11 cm From Medial Instep 27 cm Electronic Signature(s) Signed: 04/04/2022 4:16:21 PM By: Erenest Blank Entered By: Erenest Blank on 03/30/2022 15:10:42 -------------------------------------------------------------------------------- Multi-Disciplinary Care Plan Details Patient Name: Date of Service: [name], [name] 03/30/2022 3:00 PM Medical Record Number: [id number] Patient Account Number: [id number] Date of Birth/Sex: Treating RN: 1955-04-23 (67 y.o. [name] Primary Care Chantelle Verdi: [name] Other Clinician: Referring Ronnita Paz: Treating Angellynn Kimberlin/Extender:  Stone III, Miles Costain Weeks in Treatment: [place] reviewed with physician Active Inactive Pain, Acute or Chronic Nursing Diagnoses: Pain, acute or chronic: actual or potential Potential alteration in comfort, pain Goals: Patient will verbalize adequate pain control and receive pain control interventions during procedures as needed Date Initiated: 08/04/2021 Target Resolution Date: 05/13/2022 Goal Status: Active Patient/caregiver will verbalize comfort level met Date Initiated: 08/04/2021 Target Resolution Date: 05/13/2022 Goal Status: Active Interventions: Encourage patient to take pain medications as prescribed Provide education on pain management Reposition patient for comfort [name], [name] ([id number] Page 3 of 5 Treatment Activities: Administer pain control measures as ordered : 08/04/2021 Notes: Electronic Signature(s) Signed: 03/30/2022 5:04:23 PM By: Deon Pilling RN, BSN Entered By: Deon Pilling on 03/30/2022 15:07:06 -------------------------------------------------------------------------------- Pain Assessment Details Patient Name: Date of Service: [name], [name]  03/30/2022 3:00 PM Medical Record Number: [id number] Patient Account Number: [id number] Date of Birth/Sex: Treating RN: [birth year] (67 y.o. M) Primary Care Tyric Rodeheaver: [name] Other Clinician: Referring Marlynn Hinckley: Treating Tullio Chausse/Extender: [name] in Treatment: 34 Active Problems Location of Pain Severity and Description of Pain Patient Has Paino No Site Locations Pain Management and Medication Current Pain Management: Electronic Signature(s) Signed: 04/04/2022 4:16:21 PM By: Erenest Blank Entered By: Erenest Blank on 03/30/2022 15:10:22 -------------------------------------------------------------------------------- Patient/Caregiver Education Details Patient Name: Date of Service: [name], [name] 3/20/2024andnbsp3:00 PM Medical Record Number: [id number] Patient Account Number: [id number] Date of Birth/Gender: Treating RN: 1955/11/14 (66 y.o. [name] Primary Care Physician: [name] Other Clinician: Referring Physician: Treating Physician/Extender: [name] in Treatment: [phone] Education Assessment Education Provided To: Patient Education Topics Provided Wound/Skin ImpairmentKALUM, WALLAR D ([id number]) [id number] Page 4 of 5 Handouts: Caring for Your Ulcer Methods: Explain/Verbal Responses: Reinforcements needed Electronic Signature(s) Signed: 03/30/2022 5:04:23 PM By: Deon Pilling RN, BSN Entered By: Deon Pilling on 03/30/2022 15:07:22 -------------------------------------------------------------------------------- Wound Assessment Details Patient Name: Date of Service: [name], [name] 03/30/2022 3:00 PM Medical Record Number: [id number] Patient Account Number: [id number] Date of Birth/Sex: Treating RN: September 23, 1955 (67 y.o. M) Primary Care Keyton Bhat: [name] Other Clinician: Referring Alazia Crocket: Treating Brian Zeitlin/Extender: [name] in  Treatment: 34 Wound Status Wound Number: 7 Primary Venous Leg Ulcer Etiology: Wound Location: Right, Medial Malleolus Secondary Lymphedema Wounding Event: Blister Etiology: Date Acquired: 07/21/2021 Wound Open Weeks Of Treatment: 34 Status: Clustered Wound: No Comorbid Sleep Apnea, Arrhythmia, Congestive Heart Failure, History: Hypertension, Hypotension, Peripheral Arterial Disease, Peripheral Venous Disease, Type II Diabetes, Gout, Osteoarthritis Photos Wound Measurements Length: (cm) 4.1 Width: (cm) 1.6 Depth: (cm) 0.2 Area: (cm) 5.152 Volume: (cm) 1.03 % Reduction in Area: 86.5% % Reduction in Volume: 96.2% Epithelialization: Small (1-33%) Tunneling: No Undermining: No Wound Description Classification: Full Thickness With Exposed Suppo Exudate Amount: Medium Exudate Type: Serosanguineous Exudate Color: red, brown rt Structures Wound Bed Granulation Amount: Large (67-100%) Granulation Quality: Red Necrotic Amount: None Present (0%) Periwound Skin Texture Texture Color No Abnormalities Noted: No No Abnormalities Noted: No Callus: No Atrophie Blanche: No Crepitus: No Cyanosis: No Excoriation: No Ecchymosis: No Induration: No Erythema: No Rash: No Hemosiderin Staining: No Scarring: No Mottled: No Pallor: No [name], [name] ([id number] Page 5 of 5 Pallor: No Moisture Rubor: No No Abnormalities Noted: No Dry / Scaly: No Maceration: No Treatment Notes Wound #7 (Malleolus) Wound Laterality: Right, Medial Cleanser Peri-Wound Care Triamcinolone 15 (g) Discharge Instruction: Use triamcinolone 15 (g) as directed Sween Lotion (Moisturizing lotion) Discharge Instruction:  Apply moisturizing lotion as directed Topical Primary Dressing apligraf #4 Discharge Instruction: applied by Noriko Macari. adaptic and steri-strips Discharge Instruction: secured the apligraf. Secondary Dressing Woven Gauze Sponge, Non-Sterile 4x4  in Discharge Instruction: Apply over primary dressing as directed. Zetuvit Plus 4x8 in Discharge Instruction: Apply over primary dressing as directed. Secured With Compression Wrap ThreePress (3 layer compression wrap) Discharge Instruction: Apply three layer use kerlix instead of cotton compression as directed. Compression Stockings Add-Ons Electronic Signature(s) Signed: 04/04/2022 4:16:21 PM By: Erenest Blank Entered By: Erenest Blank on 03/30/2022 15:14:51 -------------------------------------------------------------------------------- Vitals Details Patient Name: Date of Service: [name], [name] 03/30/2022 3:00 PM Medical Record Number: [id number] Patient Account Number: [id number] Date of Birth/Sex: Treating RN: [birth year] (67 y.o. M) Primary Care Wanona Stare: [name] Other Clinician: Referring Exzavier Ruderman: Treating Elijahjames Fuelling/Extender: [name] in Treatment: 34 Vital Signs Time Taken: 15:09 Temperature (F): 97.9 Height (in): 71 Pulse (bpm): 70 Weight (lbs): 350 Respiratory Rate (breaths/min): 18 Body Mass Index (BMI): 48.8 Blood Pressure (mmHg): 141/91 Reference Range: 80 - 120 mg / dl Electronic Signature(s) Signed: 04/04/2022 4:16:21 PM By: Erenest Blank Entered By: Erenest Blank on 03/30/2022 15:10:14

## 2022-04-05 ENCOUNTER — Telehealth (HOSPITAL_COMMUNITY): Payer: Self-pay | Admitting: Cardiology

## 2022-04-05 NOTE — Telephone Encounter (Signed)
Samples provided until patient assistance renewal is complete  SAMPLES LEFT IN THE FRONT OFFICE  Medication Samples have been provided to the patient.  Drug name: eliquis       Strength: 5 mg         Qty: 56  LOT: [id number]  Exp.Date: 06/2023  Dosing instructions: ONE TAB TWICE A DAY   The patient has been instructed regarding the correct time, dose, and frequency of taking this medication, including desired effects and most common side effects.   Kerry Dory 10:55 AM 04/05/2022

## 2022-04-06 ENCOUNTER — Telehealth (HOSPITAL_COMMUNITY): Payer: Self-pay | Admitting: Pharmacy Technician

## 2022-04-06 ENCOUNTER — Encounter (HOSPITAL_BASED_OUTPATIENT_CLINIC_OR_DEPARTMENT_OTHER): Payer: Medicare Other | Admitting: Physician Assistant

## 2022-04-06 DIAGNOSIS — E1151 Type 2 diabetes mellitus with diabetic peripheral angiopathy without gangrene: Secondary | ICD-10-CM | POA: Diagnosis not present

## 2022-04-06 DIAGNOSIS — I890 Lymphedema, not elsewhere classified: Secondary | ICD-10-CM | POA: Diagnosis not present

## 2022-04-06 DIAGNOSIS — M109 Gout, unspecified: Secondary | ICD-10-CM | POA: Diagnosis not present

## 2022-04-06 DIAGNOSIS — M1990 Unspecified osteoarthritis, unspecified site: Secondary | ICD-10-CM | POA: Diagnosis not present

## 2022-04-06 DIAGNOSIS — L97828 Non-pressure chronic ulcer of other part of left lower leg with other specified severity: Secondary | ICD-10-CM | POA: Diagnosis not present

## 2022-04-06 DIAGNOSIS — L97818 Non-pressure chronic ulcer of other part of right lower leg with other specified severity: Secondary | ICD-10-CM | POA: Diagnosis not present

## 2022-04-06 DIAGNOSIS — E11621 Type 2 diabetes mellitus with foot ulcer: Secondary | ICD-10-CM | POA: Diagnosis not present

## 2022-04-06 DIAGNOSIS — L97518 Non-pressure chronic ulcer of other part of right foot with other specified severity: Secondary | ICD-10-CM | POA: Diagnosis not present

## 2022-04-06 DIAGNOSIS — I87333 Chronic venous hypertension (idiopathic) with ulcer and inflammation of bilateral lower extremity: Secondary | ICD-10-CM | POA: Diagnosis not present

## 2022-04-06 NOTE — Telephone Encounter (Signed)
Advanced Heart Failure Patient Advocate Encounter  Sent in Eliquis application to [employer] via fax. Patient will need to spend 3% OOP on medications in 2024 before approval can be obtained. Document scanned to chart.

## 2022-04-07 DIAGNOSIS — J449 Chronic obstructive pulmonary disease, unspecified: Secondary | ICD-10-CM | POA: Diagnosis not present

## 2022-04-08 NOTE — Progress Notes (Signed)
-  ADVANCED HF CLINIC NOTE   PCP: Ginger Organ., MD HF Cardiologist: Dr. Haroldine Laws  HPI: [name] is a 67 y.o.y/o male w/ h/o chronic systolic heart failure, chronic afib, OSA, HTN, HLD, type 2DM, OSA on CPAP ,ILD, tobacco use and venous stasis ulcers, followed by wound clinic. Also history of chemotherapy with potential cardiotoxic effects (R-CHOP for lymphoma in 2011).    Admitted in 02/2017 for acute hypoxic respiratory failure requiring intubation and ultimately tracheostomy. This was in the setting of multifocal PNA/ influenza and new onset atrial fibrillation w/ RVR. Echo showed mod LVH and severely reduced LVEF 25-30% w/ diffuse HK. RV normal. At the time, his CM was felt to be viral. Did not get cath. Per d/c summary, DCCV not pursued. Afib treated w/ rate control and Eliquis.    Repeat echo 07/2017 EF improved to 50-55%, RV mildly dilated w/ mildly reduced systolic function. He was noted to be in afib at the time of study.    08/2017, wore Zio that showed Afib, well rated controlled.    Echo 11/2018 EF 45-50%, RV mildly reduced    Unfortunately, lost to f/u by cardiology for a period of time. Had been seeing [facility] for PAD, s/p left transmetatarsal amputation.  Echo 8/23 showed EF 30-35%, severe LVH, moderately reduced RV systolic function, moderate TVR.    Admitted 8/23 with pre-syncope, ECG showed AF w/ SVR, HR 28 BPM, RBBB, had several 2 sec pauses. Beta blocker and other GDMT held with AKI and hypotension. He developed worsening periods of high-grade AV block/atrial fibrillation with slow ventricular response and lengthy pauses w/ hypotension. Underwent TVP with improvement in symptoms. R/LHC showed diffuse nonobstructive CAD, bi-V failure R>L, mild to moderate pulmonary venous hypertension. EP consulted and he underwent BiV ICD. Hospitalization complicated by chronic venous stasis wounds. Able to transition to GDMT as AKI resolved. Discharged home with [facility] PT/OT, weight 328  lbs.  Returns for f/u with his wife. Was on prednisone x 1 month for his lung disease - stopped last week. Has taken a few doses of torsemide without benefit. Wound almost healed. Breathing better. Gets around with walker. No orthopnea or PND.    Echo today 01/04/22: EF 30% RV moderately down     Cardiac Studies - PYP (10/23): equivocal grade 1, H/CL = 1.06  - Echo (8/23): EF 30-35%, moderately decreased LV function, RV moderate reduced, moderate TR  - R/LHC (8/23):   Mid RCA lesion is 40% stenosed.   RPDA lesion is 95% stenosed.   Ost LM lesion is 30% stenosed.   Mid LAD lesion is 30% stenosed.   2nd Diag lesion is 50% stenosed.   Mid Cx lesion is 30% stenosed.   Ao = 109/58 (78) LV = 119/19 RA =  21 RV = 57/19 PA = 56/22 (36) PCW = 24 (v = 30) Fick cardiac output/index = 7.3/2.7 PVR = 1.8 WU Ao sat = 99% PA sat = 68%, 69% PAPi = 2.1   1. Mostly non-obstructive CAD with high-grade lesion in mid to distal RPDA 2. Nonischemic CM  3. Mild to moderate pulmonary venous HTN with evidence of RV dysfunction   Plan/Discussion: Continue medical therapy. If patient develops angina can consider PCI of PDA; otherwise medical therapy.    Past Medical History:  Diagnosis Date   Acute systolic HF (heart failure) (HCC)    Arthritis    lt foot, hips knees    Atrial fibrillation (HCC)    Diabetes mellitus,  new onset ([place])    Diverticulosis    Dyslipidemia    Gout attack 01/16/2012   Hypertension    Internal hemorrhoids    Lymphoma ([place])    remission for about 2 years, chemo 3 years prior   Pneumonia due to COVID-19 virus 07/2018   Pulmonary hypertension ([place])    Sepsis ([place]) 02/2017   secondary to influenza; requiring trach   Tubular adenoma of colon    Venous stasis ulcers (HCC)     Current Outpatient Medications  Medication Sig Dispense Refill   acetaminophen (TYLENOL) 325 MG tablet Take 650 mg by mouth every 6 (six) hours as needed for moderate pain.      ascorbic  acid (VITAMIN C) 500 MG tablet Take 1 tablet (500 mg total) by mouth 3 (three) times daily. (Patient taking differently: Take 500 mg by mouth 2 (two) times daily.) 30 tablet 0   aspirin 81 MG chewable tablet Chew 1 tablet (81 mg total) by mouth daily. 30 tablet 11   atorvastatin (LIPITOR) 40 MG tablet TAKE 1 TABLET BY MOUTH EVERY DAY 30 tablet 11   carvedilol (COREG) 3.125 MG tablet Take 1 tablet (3.125 mg total) by mouth 2 (two) times daily. 180 tablet 3   cholecalciferol (VITAMIN D) 25 MCG (1000 UNIT) tablet Take 1 tablet (1,000 Units total) by mouth daily. 30 tablet 0   colchicine 0.6 MG tablet Take 0.6 mg by mouth 2 (two) times daily as needed.     Continuous Blood Gluc Receiver (FREESTYLE LIBRE 2 READER) DEVI 1 each by Does not apply route daily. 1 each 3   Continuous Blood Gluc Sensor (FREESTYLE LIBRE 2 SENSOR) MISC 1 each by Does not apply route daily. 1 each 3   dapagliflozin propanediol (FARXIGA) 10 MG TABS tablet Take 1 tablet (10 mg total) by mouth daily. 90 tablet 1   ELIQUIS 5 MG TABS tablet TAKE 1 TABLET BY MOUTH TWICE A DAY 180 tablet 1   KLOR-CON M20 20 MEQ tablet Take 20 mEq by mouth daily.     Omega 3 340 MG CPDR Take 1 capsule (340 mg total) by mouth 2 (two) times daily. (Patient taking differently: Take 340 mg by mouth daily.) 60 capsule 0   omeprazole (PRILOSEC) 40 MG capsule Take 1 capsule (40 mg total) by mouth daily. 30 capsule 0   polycarbophil (FIBERCON) 625 MG tablet Take 1 tablet (625 mg total) by mouth daily. 30 tablet 0   sacubitril-valsartan (ENTRESTO) 97-103 MG Take 1 tablet by mouth 2 (two) times daily. 60 tablet 11   sertraline (ZOLOFT) 50 MG tablet Take 1 tablet (50 mg total) by mouth daily. 30 tablet 0   spironolactone (ALDACTONE) 25 MG tablet Take 1 tablet (25 mg total) by mouth daily. 30 tablet 11   Tirzepatide (MOUNJARO Phillips) Inject 1 Dose into the skin once a week.     torsemide (DEMADEX) 20 MG tablet Take 1 tablet (20 mg total) by mouth as needed. 30 tablet 6    vitamin B-12 (CYANOCOBALAMIN) 1000 MCG tablet Take 1 tablet (1,000 mcg total) by mouth daily. 30 tablet 0   No current facility-administered medications for this visit.   Allergies  Allergen Reactions   Latex Hives and Swelling   Ace Inhibitors     Other reaction(s): cough   Penicillin G     Other reaction(s): rash Has tolerated amoxicillin   Social History   Socioeconomic History   Marital status: Divorced    Spouse name: Not on file  Number of children: Not on file   Years of education: Not on file   Highest education level: Not on file  Occupational History   Occupation: cosmetologist  Tobacco Use   Smoking status: Former    Packs/day: 0.50    Years: 20.00    Additional pack years: 0.00    Total pack years: 10.00    Types: Cigarettes    Quit date: 01/10/2009    Years since quitting: 13.2   Smokeless tobacco: Never  Vaping Use   Vaping Use: Never used  Substance and Sexual Activity   Alcohol use: Not Currently    Comment: 2-3 times per week   Drug use: No   Sexual activity: Not on file  Other Topics Concern   Not on file  Social History Narrative   Not on file   Social Determinants of Health   Financial Resource Strain: Not on file  Food Insecurity: No Food Insecurity (09/20/2021)   Hunger Vital Sign    Worried About Running Out of Food in the Last Year: Never true    Ran Out of Food in the Last Year: Never true  Transportation Needs: No Transportation Needs (09/20/2021)   PRAPARE - Hydrologist (Medical): No    Lack of Transportation (Non-Medical): No  Physical Activity: Not on file  Stress: Not on file  Social Connections: Not on file  Intimate Partner Violence: Not on file   Family History  Problem Relation Age of Onset   Dementia Brother 24       frontal lobe    Dementia Mother    Arthritis Mother    Hypertension Mother    CVA Sister 19   Colon cancer Neg Hx    Pancreatic cancer Neg Hx    Stomach cancer Neg Hx     Rectal cancer Neg Hx    Liver cancer Neg Hx    There were no vitals taken for this visit.  Wt Readings from Last 3 Encounters:  03/21/22 (!) 152.5 kg (336 lb 1.6 oz)  03/09/22 (!) 150 kg (330 lb 9.6 oz)  01/04/22 (!) 149 kg (328 lb 6.4 oz)   PHYSICAL EXAM: General:  Obese male No resp difficulty HEENT: normal Neck: supple. JVP 8-9  Carotids 2+ bilat; no bruits. No lymphadenopathy or thryomegaly appreciated. Cor: PMI nondisplaced. Regular rate & rhythm. No rubs, gallops or murmurs. Lungs: clear Abdomen: soft, nontender, nondistended. No hepatosplenomegaly. No bruits or masses. Good bowel sounds. Extremities: no cyanosis, clubbing, rash, 2+ edema RLE wrapped  Neuro: alert & orientedx3, cranial nerves grossly intact. moves all 4 extremities w/o difficulty. Affect pleasant   MDT device interrogation (personally reviewed): OptiVol up. 100% bivICD No VT/AF Activity 1.1 hr/day  ASSESSMENT & PLAN: Chronic Systolic Biventricular Heart Failure - Echo (2/19): EF 25-30% w/ diffuse HK. RV normal. Felt to be viral CM +/- component of tachymediated in setting of influenza PNA and rapid Afib  - Echo (7/19): EF recovered, 50-55%, RV mildly reduced - Echo (11/20): EF 45-50%, RV mildly reduced (in rate controlled afib)  - Echo (8/23): EF 30-35%, severe LVH, moderately reduced RV systolic function, moderate TR - R/LHC (8/23): diffuse non-obstructive CAD with 90% lesion in dLAD, BiV failure R>L: RA 21 PA 56/22 (36) PCW 24 Fick 7.3/2.7 PaPi 2.1 - Myeloma panel and urine immunofixation negative.  - PYP scan 10/23 equivocal. Resent Genetic testing. - s/p CRT-D  - Echo today 01/04/22: EF 30% RV moderately down  - Stable NYHA  II-III, confounded by body habitus and LE wounds. Volume up today, weight up 18 lbs - Take torsemide 40 daily x 3 days and reassess - Continue Entresto to 97/103 mg bid. - Continue spiro 25 mg daily. - Continue Farxiga 10 mg daily.  -  Resume carvedilol 3.125 bid    2.  Profound Bradycardia w/ High-grade AV block - s/p BiV ICD - interrogation as above   3. Chronic Afib - Dates back to at least 2019, has been historically well rate controlled - Continue CPAP.  - Continue Eliquis. No bleeding issues.   4. PAD/ Chronic Venous Stasis Wounds  - s/p left transmetatarsal amputation - Followed by [facility] and Wound clinic  5. H/o Lymphoma - s/p R-CHOP in 2011 (potential cardiotoxic effects)    6. Type 2DM - Hgb A1c 6.0 - On SGLT2i    7. CAD - LHC (8/23) diffuse mostly non-obstructive CAD, not cause of CM - Continue medical management with ASA/statin. - No s/s angina - If has angina can consider PCI PDA   8. Morbid obesity - There is no height or weight on file to calculate BMI. - Needs weight loss. - On Mounjaro   9. ILD - Hi rest chest CT nonspecific, but ? NSIP vs chronic hypersensitivity pneumonitis - Followed by Pulmonary   Rafael Bihari, FNP  4:18 PM

## 2022-04-11 ENCOUNTER — Ambulatory Visit (HOSPITAL_COMMUNITY)
Admission: RE | Admit: 2022-04-11 | Discharge: 2022-04-11 | Disposition: A | Discharge status: Home / Self Care | Payer: Medicare Other | Source: Ambulatory Visit | Attending: Family Medicine | Admitting: Family Medicine

## 2022-04-11 ENCOUNTER — Encounter (HOSPITAL_COMMUNITY): Payer: Self-pay

## 2022-04-11 VITALS — BP 134/86 | HR 77 | Wt 341.8 lb

## 2022-04-11 DIAGNOSIS — Z7982 Long term (current) use of aspirin: Secondary | ICD-10-CM | POA: Insufficient documentation

## 2022-04-11 DIAGNOSIS — Z6841 Body Mass Index (BMI) 40.0 and over, adult: Secondary | ICD-10-CM | POA: Diagnosis not present

## 2022-04-11 DIAGNOSIS — E11622 Type 2 diabetes mellitus with other skin ulcer: Secondary | ICD-10-CM

## 2022-04-11 DIAGNOSIS — Z7901 Long term (current) use of anticoagulants: Secondary | ICD-10-CM | POA: Insufficient documentation

## 2022-04-11 DIAGNOSIS — Z8572 Personal history of non-Hodgkin lymphomas: Secondary | ICD-10-CM | POA: Insufficient documentation

## 2022-04-11 DIAGNOSIS — J849 Interstitial pulmonary disease, unspecified: Secondary | ICD-10-CM | POA: Diagnosis not present

## 2022-04-11 DIAGNOSIS — I5082 Biventricular heart failure: Secondary | ICD-10-CM | POA: Insufficient documentation

## 2022-04-11 DIAGNOSIS — I2510 Atherosclerotic heart disease of native coronary artery without angina pectoris: Secondary | ICD-10-CM | POA: Diagnosis not present

## 2022-04-11 DIAGNOSIS — I482 Chronic atrial fibrillation, unspecified: Secondary | ICD-10-CM | POA: Diagnosis not present

## 2022-04-11 DIAGNOSIS — Z79899 Other long term (current) drug therapy: Secondary | ICD-10-CM | POA: Insufficient documentation

## 2022-04-11 DIAGNOSIS — I110 Hypertensive heart disease with heart failure: Secondary | ICD-10-CM | POA: Diagnosis not present

## 2022-04-11 DIAGNOSIS — I5023 Acute on chronic systolic (congestive) heart failure: Secondary | ICD-10-CM

## 2022-04-11 DIAGNOSIS — I739 Peripheral vascular disease, unspecified: Secondary | ICD-10-CM

## 2022-04-11 DIAGNOSIS — R001 Bradycardia, unspecified: Secondary | ICD-10-CM

## 2022-04-11 LAB — BASIC METABOLIC PANEL
Anion gap: 11 (ref 5–15)
BUN: 17 mg/dL (ref 8–23)
CO2: 25 mmol/L (ref 22–32)
Calcium: 9 mg/dL (ref 8.9–10.3)
Chloride: 101 mmol/L (ref 98–111)
Creatinine, Ser: 1.01 mg/dL (ref 0.61–1.24)
GFR, Estimated: >60 mL/min (ref >60)
Glucose, Bld: 102 mg/dL — ABNORMAL HIGH (ref 70–99)
Potassium: 4.4 mmol/L (ref 3.5–5.1)
Sodium: 137 mmol/L (ref 135–145)

## 2022-04-11 LAB — BRAIN NATRIURETIC PEPTIDE: B Natriuretic Peptide: 1060 pg/mL — ABNORMAL HIGH (ref 0.0–100.0)

## 2022-04-11 MED ORDER — POTASSIUM CHLORIDE CRYS ER 20 MEQ PO TBCR: 40.0000 meq | EXTENDED_RELEASE_TABLET | Freq: Every day | ORAL | 8 refills | Status: DC

## 2022-04-11 MED ORDER — TORSEMIDE 20 MG PO TABS
40.0000 mg | ORAL_TABLET | Freq: Every day | ORAL | 8 refills | Status: DC
Start: 2022-04-11 — End: 2022-05-02

## 2022-04-11 NOTE — Patient Instructions (Addendum)
Thank you for coming in today  Labs were done today, if any labs are abnormal the clinic will call you No news is good news  Medications: For 3 days starting 04/12/2022 Take Torsemide 40 mg 2 tablets twice daily with Potassium 40 meq 2 tablets twice daily on 04/12/2022 04/13/2022 04/14/2022  04/15/2022 START Torsemide 40 mg 2 tablets daily and 40 meq of Potassium daily  Follow up appointments: 3 weeks in clinic   Your physician recommends that you schedule a follow-up appointment in:     Do the following things EVERYDAY: Weigh yourself in the morning before breakfast. Write it down and keep it in a log. Take your medicines as prescribed Eat low salt foods--Limit salt (sodium) to 2000 mg per day.  Stay as active as you can everyday Limit all fluids for the day to less than 2 liters   At the [facility], you and your health needs are our priority. As part of our continuing mission to provide you with exceptional heart care, we have created designated Provider Care Teams. These Care Teams include your primary Cardiologist (physician) and Advanced Practice Providers (APPs- Physician Assistants and Nurse Practitioners) who all work together to provide you with the care you need, when you need it.   You may see any of the following providers on your designated Care Team at your next follow up: Dr Glori Bickers Dr Loralie Champagne Dr. Roxana Hires, NP Lyda Jester, [place] [facility], [place] Forestine Na, NP Audry Riles, PharmD   Please be sure to bring in all your medications bottles to every appointment.    Thank you for choosing [facility]  If you have any questions or concerns before your next appointment please send [place] a message through [place] or call our office at [phone].    TO LEAVE A MESSAGE FOR THE NURSE SELECT OPTION 2, PLEASE LEAVE A MESSAGE INCLUDING: YOUR NAME DATE OF  BIRTH CALL BACK NUMBER REASON FOR CALL**this is important as we prioritize the call backs  YOU WILL RECEIVE A CALL BACK THE SAME DAY AS LONG AS YOU CALL BEFORE 4:00 PM

## 2022-04-11 NOTE — Telephone Encounter (Signed)
Advanced Heart Failure Patient Advocate Encounter  Out of pocket expenses and [id number] were provided to [facility] and faxed to [facility] on 04/11/2022.

## 2022-04-13 ENCOUNTER — Encounter (HOSPITAL_BASED_OUTPATIENT_CLINIC_OR_DEPARTMENT_OTHER): Payer: Medicare Other | Attending: Physician Assistant | Admitting: Physician Assistant

## 2022-04-13 DIAGNOSIS — Z6841 Body Mass Index (BMI) 40.0 and over, adult: Secondary | ICD-10-CM | POA: Diagnosis not present

## 2022-04-13 DIAGNOSIS — I890 Lymphedema, not elsewhere classified: Secondary | ICD-10-CM | POA: Diagnosis not present

## 2022-04-13 DIAGNOSIS — I87333 Chronic venous hypertension (idiopathic) with ulcer and inflammation of bilateral lower extremity: Secondary | ICD-10-CM | POA: Insufficient documentation

## 2022-04-13 DIAGNOSIS — I872 Venous insufficiency (chronic) (peripheral): Secondary | ICD-10-CM | POA: Diagnosis not present

## 2022-04-13 DIAGNOSIS — L97818 Non-pressure chronic ulcer of other part of right lower leg with other specified severity: Secondary | ICD-10-CM | POA: Diagnosis not present

## 2022-04-13 DIAGNOSIS — I2510 Atherosclerotic heart disease of native coronary artery without angina pectoris: Secondary | ICD-10-CM | POA: Insufficient documentation

## 2022-04-13 DIAGNOSIS — E1151 Type 2 diabetes mellitus with diabetic peripheral angiopathy without gangrene: Secondary | ICD-10-CM | POA: Diagnosis not present

## 2022-04-13 DIAGNOSIS — I509 Heart failure, unspecified: Secondary | ICD-10-CM | POA: Diagnosis not present

## 2022-04-13 DIAGNOSIS — L97518 Non-pressure chronic ulcer of other part of right foot with other specified severity: Secondary | ICD-10-CM | POA: Insufficient documentation

## 2022-04-13 DIAGNOSIS — L97312 Non-pressure chronic ulcer of right ankle with fat layer exposed: Secondary | ICD-10-CM | POA: Diagnosis not present

## 2022-04-13 DIAGNOSIS — E11621 Type 2 diabetes mellitus with foot ulcer: Secondary | ICD-10-CM | POA: Diagnosis not present

## 2022-04-13 DIAGNOSIS — L97828 Non-pressure chronic ulcer of other part of left lower leg with other specified severity: Secondary | ICD-10-CM | POA: Insufficient documentation

## 2022-04-13 DIAGNOSIS — I110 Hypertensive heart disease with heart failure: Secondary | ICD-10-CM | POA: Insufficient documentation

## 2022-04-13 NOTE — Progress Notes (Signed)
REMY, KIHM ([id number]) [id number] Page 1 of 1 Visit Report for 04/06/2022 SuperBill Details Patient Name: Date of Service: [name], [name] 04/06/2022 Medical Record Number: [id number] Patient Account Number: [id number] Date of Birth/Sex: Treating RN: 04-12-1955 (67 y.o. [name], [name] Primary Care Provider: [name] Other Clinician: Referring Provider: Treating Provider/Extender: [name] in Treatment: 35 Diagnosis Coding ICD-10 Codes Code Description I89.0 Lymphedema, not elsewhere classified I87.333 Chronic venous hypertension (idiopathic) with ulcer and inflammation of bilateral lower extremity L97.828 Non-pressure chronic ulcer of other part of left lower leg with other specified severity L97.818 Non-pressure chronic ulcer of other part of right lower leg with other specified severity E11.621 Type 2 diabetes mellitus with foot ulcer L97.518 Non-pressure chronic ulcer of other part of right foot with other specified severity Facility Procedures CPT4 Code Description Modifier Quantity [id number] (Facility Use Only) [id number] - APPLY MULTLAY COMPRS LWR RT LEG 1 Electronic Signature(s) Signed: 04/08/2022 2:14:59 PM By: [name] PA-C Signed: 04/13/2022 4:26:45 PM By: Rhae Hammock RN Entered By: Rhae Hammock on 04/06/2022 15:41:12

## 2022-04-13 NOTE — Progress Notes (Addendum)
LOVELACE, KNUTESON ([id number]) [id number] Page 1 of 10 Visit Report for 04/13/2022 Chief Complaint Document Details Patient Name: Date of Service: [name], [name] 04/13/2022 3:00 PM Medical Record Number: [id number] Patient Account Number: [id number] Date of Birth/Sex: Treating RN: [birth year] (67 y.o. M) Primary Care Provider: [name] Other Clinician: Referring Provider: Treating Provider/Extender: [name] in Treatment: 36 Information Obtained from: Patient Chief Complaint 08/04/2021; patient returns to clinic with bilateral leg wounds as well as areas on the right foot Electronic Signature(s) Signed: 04/13/2022 2:48:56 PM By: [name] PA-C Entered By: [name] on 04/13/2022 14:48:56 -------------------------------------------------------------------------------- Debridement Details Patient Name: Date of Service: [name], [name]. 04/13/2022 3:00 PM Medical Record Number: [id number] Patient Account Number: [id number] Date of Birth/Sex: Treating RN: 1955-01-23 (67 y.o. [name], [name] Primary Care Provider: [name] Other Clinician: Referring Provider: Treating Provider/Extender: [name] in Treatment: 36 Debridement Performed for Assessment: Wound #7 Right,Medial Malleolus Performed By: Physician [name], PA Debridement Type: Debridement Severity of Tissue Pre Debridement: Fat layer exposed Level of Consciousness (Pre-procedure): Awake and Alert Pre-procedure Verification/Time Out Yes - 15:15 Taken: Start Time: 15:16 Pain Control: Lidocaine 5% topical ointment T Area Debrided (L x W): otal 4.2 (cm) x 4.5 (cm) = 18.9 (cm) Tissue and other material debrided: Viable, Non-Viable, Callus, Slough, Subcutaneous, Skin: Dermis , Skin: Epidermis, Biofilm, Slough Level: Skin/Subcutaneous Tissue Debridement Description: Excisional Instrument: Curette Bleeding: Minimum Hemostasis  Achieved: Pressure End Time: 15:23 Procedural Pain: 0 Post Procedural Pain: 0 Response to Treatment: Procedure was tolerated well Level of Consciousness (Post- Awake and Alert procedure): Post Debridement Measurements of Total Wound Length: (cm) 4 Width: (cm) 4.2 Depth: (cm) 0.2 Volume: (cm) 2.639 Character of Wound/Ulcer Post Debridement: Improved Severity of Tissue Post Debridement: Fat layer exposed Post Procedure Diagnosis Same as Pre-procedure Electronic Signature(s) Signed: 04/13/2022 4:14:50 PM By: [name] PA-C Signed: 04/13/2022 4:55:19 PM By: Deon Pilling RN, BSN Entered By: Deon Pilling on 04/13/2022 15:23:26 [name], [name] ([id number]) [id number] Page 2 of 10 -------------------------------------------------------------------------------- HPI Details Patient Name: Date of Service: [name], [name] 04/13/2022 3:00 PM Medical Record Number: [id number] Patient Account Number: [id number] Date of Birth/Sex: Treating RN: 06/07/1955 (67 y.o. M) Primary Care Provider: [name] Other Clinician: Referring Provider: Treating Provider/Extender: [name] in Treatment: 36 History of Present Illness HPI Description: ADMISSION 03/22/2021 This is a 67 year old man with a past medical history significant for diabetes type 2, congestive heart failure, peripheral arterial disease, morbid obesity, venous insufficiency, and coronary artery disease. He has been followed by [name] in podiatry, who performed a transmetatarsal amputation on the left foot in August 2022. He had issues healing that wound, but based upon Dr. [name] notes, ultimately the TMA wound healed. During his recovery from that surgery, however, ulcers opened up over the DIP joint of the right second and third toe. These have apparently closed and reopened multiple times. It sounds like one of the issues has been moisture accumulation and maceration of the  tissues causing them to reopen. At his last visit with [name], on March 01, 2021, there continues to be problems with moisture and he was referred to wound care for further evaluation and management. He had a formal aortogram with runoff performed prior to his TMA. The findings are copied here: Patient has inline flow to both feet with no significant flow-limiting lesion that would be amenable to percutaneous or open  revascularization. He does have an element of small vessel disease and has a short segment occlusion of the distal anterior tibial/dorsalis pedis artery on the left foot but does have posterior tibial artery flow. Would recommend management of wounds with amputation of toes 2 and 3 on the right foot if the wounds do not heal and deteriorate. Transmetatarsal amputation on the left side has as good a blood supply as it is going to get and hopefully this will heal in the future. Formal ABIs were done in January 2023. They are normal bilaterally. ABI Findings: +---------+------------------+-----+----------+--------+ Right Rt Pressure (mmHg)IndexWaveform Comment  +---------+------------------+-----+----------+--------+ Brachial 160     +---------+------------------+-----+----------+--------+ PTA 192 1.06 monophasic  +---------+------------------+-----+----------+--------+ DP 159 0.88 monophasic  +---------+------------------+-----+----------+--------+ Great T oe145 0.80    +---------+------------------+-----+----------+--------+ +--------+------------------+-----+---------+-------+ Left Lt Pressure (mmHg)IndexWaveform Comment +--------+------------------+-----+---------+-------+ [id number]     +--------+------------------+-----+---------+-------+ PTA 204 1.13 triphasic  +--------+------------------+-----+---------+-------+ DP 194 1.07 biphasic    +--------+------------------+-----+---------+-------+ +-------+-----------+-----------+------------+------------+ ABI/TBIT oday's ABIT oday's TBIPrevious ABIPrevious TBI +-------+-----------+-----------+------------+------------+ Right 1.06 0.8 1.26 0.65  +-------+-----------+-----------+------------+------------+ Left 1.13 amputation 1.15 amputation  +-------+-----------+-----------+------------+------------+ Previous ABI on 08/06/20 at [facility] Pedal pressures falsely elevated due to medial calcification. Summary: Right: Resting right ankle-brachial index is within normal range. The right toe-brachial index is normal. Left: Resting left ankle-brachial index is within normal range. READMISSION 08/04/2021 [name] is now a 67 year old man who I remember from this clinic many years ago I think he had a right lower extremity predominantly venous wound at the time. He was here for 1 visit in March of this year had wounds on his right second and third toes we apparently dressed them many and they healed so he did not come back. He is listed in [place] is being a diabetic although the patient denies this says he is verified it with his primary doctor. In any case over the last several weeks or so according the patient although these wounds look somewhat more chronic than that he has developed predominantly large wounds on the right medial and right lateral ankle smaller areas on the left leg and areas on the dorsal aspect of his right second and third toes. Its not clear how he has been dressing these. 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 The patient has known PAD. He had an angiogram in August 2022 at which time he had nonhealing wounds in both feet. On the left his major vessels in the SEMAJA, STEFFEK [name] ([id number]) [id number] Page 3 of 10 thigh were all patent. He  had three-vessel patent to the level of the ankle. He had a very short occlusion in the left anterior tibial. On the right lower extremity the major vessels in his thigh were all patent. He had three-vessel runoff to the foot sluggish filling of the anterior tibial artery. He was felt to have some component of small vessel disease but nothing that was amenable or needed revascularization. It was recommended that he have amputation of the second and third toes on the right foot if they did not heal He has been following with [name] of podiatry. [name] got him juxta lite stockings although I do not think he had them on properly he has uncontrolled edema in both legs Past medical history includes type 2 diabetes [although the patient really denies this], left TMA in 2022,lower extremity wounds in fact attendance at this clinic in 2009-2010, A-fib on Eliquis, chronic venous insufficiency with secondary lymphedema  history of non-Hodgkin's lymphoma. 08-11-2021 upon evaluation today patient presents for follow-up evaluation he was seen last Wednesday initially for inspection here in our clinic. With that being said he tells me that he unfortunately has been having a lot of drainage and is actually coming through his wrap. Fortunately I do not see any evidence of active infection locally or systemically at this time which is great news. No fevers, chills, nausea, vomiting, or diarrhea. With that being said there does appear to be some evidence of local infection based on what I am seeing today. 08-18-2021 upon evaluation today patient appears to be doing okay currently in regard to his wounds with that being said that he is doing much better but still has a long ways to go to get to where he wanted to be. I think the infection is significantly improved. He has another week of the antibiotic at this point. 08-25-2021 upon evaluation today patient's wounds are actually doing decently well he has erythema has  significantly improved. I think the cellulitis is under controlled I am going to continue him on 2 more weeks of the Levaquin at 500 mg this is a lower dose but I am hoping it will be better for him. 09-08-2021 upon evaluation today patient appears to be doing excellent in regard to his wounds. Since I last saw him he was actually in the hospital where he ended up having a pacemaker put in. Subsequently he tells me that he is actually doing quite well although they were unsure whether they were going to do it due to the fact that he had the wounds on his legs. And then I am glad they did anything seem to be doing well. 09-15-2021 upon evaluation patient's wounds are actually showing signs of improvement. The right side wounds do appear to have some need for sharp debridement today and I [name] go ahead and proceed with that. I think that if we get the wounds cleaned up he will actually show signs of continued improvement. I am also leaning towards switching to [facility] which I think will be a much better option for him. 09-22-2021 patient's wounds are showing signs of excellent improvement. I am actually extremely pleased with where we stand and I think that the patient is making great progress. There does not appear to be any signs of active infection. 09-29-2021 upon evaluation today patient appears to be doing excellent in regard to his wounds. He is actually tolerating the dressing changes without complication. Fortunately I see no evidence of active infection locally or systemically at this time which is great news and overall I am extremely pleased with where we stand currently. 09-2721 upon evaluation today patient actually appears to be doing excellent in regard to his wounds. The left leg is almost completely healed the right ankle is significantly smaller. Overall I am extremely pleased with where we stand at this point. I do not see any evidence of active infection at this time. 10-13-2021  upon evaluation today patient appears to be doing excellent in regard to his wounds. I really feel like he is making good progress here and I am very pleased in that regard. Fortunately I do not see any signs of active infection at this time. We are using the [facility] topical antibiotic therapy. 10-20-2021 upon evaluation today patient appears to be doing well with regard to his wound on the right medial ankle region the left leg is almost completely healed. I am actually very pleased with  where we stand today. 11-03-2021 upon evaluation today patient's wound actually is going require some sharp debridement but appears to be doing much better which is great news. Fortunately I do not see any signs of active infection at this time. 11-10-2021 upon evaluation today patient's wound is actually showing signs of excellent improvement. Fortunately I do not see any evidence of infection locally or systemically which is great news and overall I am extremely pleased with where we stand today. I do believe he is making good progress he does have his [facility] topical antibiotics with him here today. 11-24-2021 upon evaluation today patient actually showing signs of excellent improvement this appears to be doing much better. Fortunately I do not see any evidence of infection locally or systemically at this time. 12-01-2021 upon evaluation today patient appears to be doing well currently in regard to his wound. He has been tolerating the dressing changes without complication. Fortunately I do not see any evidence of active infection at this time which is great news and overall I am extremely pleased with where we stand today. 12-15-2021 upon evaluation today patient appears to be doing well with regard to his wound. He is showing signs of improvement is slow but nonetheless we are making improvements here. Fortunately I do not see any evidence of active infection locally nor systemically at this time. We are still  using the [facility] topical antibiotic over the open area only. 12-29-2021 upon evaluation today patient's wound actually is showing signs of excellent improvement. It has been 2 weeks since I perform any debridement and it definitely shows he has some tissue that needs to be cleaned away but I think we can do so quite easily and readily today. The good news is I do not see any signs of infection I think he is doing much better in that regard. Overall I am extremely pleased with where we stand. 01-12-2022 upon evaluation today patient appears to be doing well currently in regard to his wound although it is not getting significantly smaller it is also not getting any larger. Fortunately I do not see any evidence of infection locally nor systemically which is great news and overall I am extremely pleased with where things stand currently. 01-19-2022 upon evaluation today patient appears to be doing well currently in regard to his wound. The PolyMem actually seems to have done extremely well for him. Fortunately I do not see any signs of infection locally nor systemically at this time. 01-26-2022 upon evaluation today patient appears to be making progress. Fortunately there does not appear to be any signs of infection which is great news. No fevers, chills, nausea, vomiting, or diarrhea. With that being said this is very slow to heal and although it is smaller I still feel like we may want to try to do something to speed this up I think that a skin substitute could be beneficial, look into Kerecis. 02-02-2022 upon evaluation today patient's wound is actually showing signs of excellent improvement. I do not see any evidence of infection and overall I think that we are headed in the right direction. Fortunately I think that he is tolerating the dressing changes without complication. 02-09-2022 upon evaluation today patient appears to be doing well currently in regard to his wound. It does look a little bit  macerated but fortunately does not appear to be showing any signs of significant skin breakdown over the macerated area which is good news. Fortunately I do not see any evidence of active  infection locally nor systemically at this point which is great news. 02-23-2022 upon evaluation today patient appears to be doing well currently in regard to his wounds. In fact his area on the ankle is actually showing signs of healing quite nicely. We do have the Apligraf ready today I am hopeful this is going to speed things up and get this closed much more effectively and quickly. Fortunately I do not see any signs of active infection locally nor systemically at this time which is great news. 03-02-2022 upon evaluation today patient appears to be doing excellent in regard to his wound. He is actually been tolerating the dressing changes without complication the wound on the left medial lower extremity is doing quite well and Apligraf seems to have been extremely beneficial for him. I am extremely [name], [name] ([id number]) [id number] Page 4 of 10 pleased with where we stand today. 03/16/2022: The wound measurements are smaller today. He has accumulation of a yellow crust around the edges and slough on the surface. There is a musty odor coming from the wound. He has been getting Apligraf. 03-30-2022 upon evaluation today patient appears to be making progress. In regard to his leg ulcer. This is slow but nonetheless the Apligraf has fed things up. Fortunately I do not see any evidence of infection locally nor systemically at this time. No fevers, chills, nausea, vomiting, or diarrhea. Patient is here for Apligraf #4 today. 04-13-2022 upon evaluation today patient appears to be doing a little bit more poorly in regard to his wound. He actually feels like his wrap may have been a little bit tight. Fortunately there does not appear to be any signs of active infection locally nor systemically  which is great news and I am pleased in that regard. Electronic Signature(s) Signed: 04/13/2022 3:59:26 PM By: [name] PA-C Entered By: [name] on 04/13/2022 15:59:26 -------------------------------------------------------------------------------- Physical Exam Details Patient Name: Date of Service: Iannuzzi, [name]. 04/13/2022 3:00 PM Medical Record Number: [id number] Patient Account Number: [id number] Date of Birth/Sex: Treating RN: 1955/04/25 (67 y.o. M) Primary Care Provider: [name] Other Clinician: Referring Provider: Treating Provider/Extender: [name] in Treatment: 36 Constitutional Chronically ill appearing but in no apparent acute distress. Respiratory normal breathing without difficulty. Psychiatric this patient is able to make decisions and demonstrates good insight into disease process. Alert and Oriented x 3. pleasant and cooperative. Notes Upon inspection patient's wound bed actually showed signs of good granulation epithelization at this point. Fortunately I do not see any signs of infection at this time which is great news I do believe that we are headed in the right direction in general. With that being said there is some irritation around the edges of the wound area and again I think this is due to drainage he did see his heart doctor he is actually up on the furosemide 40 mg 2 times a day. With that being said the patient tells me that he was told about 20 pounds of fluid according to the heart doctor. Nonetheless with this coming off I think that may improve things dramatically. Also think the patient could benefit from a continuation of therapy with the Apligraf he had 1 left but I would like to hold off on that today. I am probably going to go ahead and not order this for next week just in case things or not doing as well as I would like we will plan to order it next week for  the following if he looks to be doing well. He  is in agreement with that plan. Electronic Signature(s) Signed: 04/13/2022 4:00:39 PM By: [name] PA-C Entered By: [name] on 04/13/2022 16:00:38 -------------------------------------------------------------------------------- Physician Orders Details Patient Name: Date of Service: Novakowski, [name]. 04/13/2022 3:00 PM Medical Record Number: [id number] Patient Account Number: [id number] Date of Birth/Sex: Treating RN: 1955/04/29 (67 y.o. [name], [name] Primary Care Provider: [name] Other Clinician: Referring Provider: Treating Provider/Extender: [name] in [id number] Verbal / Phone Orders: No Diagnosis Coding ICD-10 Coding Code Description I89.0 Lymphedema, not elsewhere classified I87.333 Chronic venous hypertension (idiopathic) with ulcer and inflammation of bilateral lower extremity L97.828 Non-pressure chronic ulcer of other part of left lower leg with other specified severity L97.818 Non-pressure chronic ulcer of other part of right lower leg with other specified severity E11.621 Type 2 diabetes mellitus with foot ulcer L97.518 Non-pressure chronic ulcer of other part of right foot with other specified severity [name], [name] ([id number]) [id number] Page 5 of 10 Follow-up Appointments ppointment in 1 week. Margarita Grizzle Wednesday 04/20/2022 3pm room 9 Return A ppointment in 2 weeks. Margarita Grizzle Wednesday 04/27/2022 3pm room 9 Return A Anesthetic (In clinic) Topical Lidocaine 5% applied to wound bed Cellular or Tissue Based Products Cellular or Tissue Based Product Type: - Run IVR for Kerrecis- denied #1 Apligraf applied 02/23/2022 #2 Apligraf applied 03/02/2022 #3 Apligraf applied 03/16/2022 #4 Apligraf applied 03/30/2022 HOLD APLIGRAF this week. Cellular or Tissue Based Product applied to wound bed, secured with steri-strips, cover with Adaptic or Mepitel. (DO NOT REMOVE). Bathing/ Shower/ Hygiene May shower with  protection but do not get wound dressing(s) wet. Protect dressing(s) with water repellant cover (for example, large plastic bag) or a cast cover and may then take shower. Edema Control - Lymphedema / SCD / Other Elevate legs to the level of the heart or above for 30 minutes daily and/or when sitting for 3-4 times a day throughout the day. Avoid standing for long periods of time. Exercise regularly Moisturize legs daily. - apply every night before bed to left leg. Compression stocking or Garment 30-40 mm/Hg pressure to: - wear your VIVE compression stocking to left leg. apply in the morning and remove at night. Will apply tubigrip size [name] to left leg. Remove once you get home and use your compression stockings. Wound Treatment Wound #7 - Malleolus Wound Laterality: Right, Medial Peri-Wound Care: Triamcinolone 15 (g) 1 x Per Week/30 Days Discharge Instructions: Use triamcinolone 15 (g) as directed Peri-Wound Care: Sween Lotion (Moisturizing lotion) 1 x Per Week/30 Days Discharge Instructions: Apply moisturizing lotion as directed Prim Dressing: Maxorb Extra Ag+ Alginate Dressing, 2x2 (in/in) 1 x Per Week/30 Days ary Discharge Instructions: Apply to wound bed as instructed Secondary Dressing: Woven Gauze Sponge, Non-Sterile 4x4 in 1 x Per Week/30 Days Discharge Instructions: Apply over primary dressing as directed. Secondary Dressing: Zetuvit Plus 4x8 in 1 x Per Week/30 Days Discharge Instructions: Apply over primary dressing as directed. Compression Wrap: ThreePress (3 layer compression wrap) 1 x Per Week/30 Days Discharge Instructions: Apply three layer use kerlix instead of cotton compression as directed. Electronic Signature(s) Signed: 04/13/2022 4:14:50 PM By: [name] PA-C Signed: 04/13/2022 4:55:19 PM By: Deon Pilling RN, BSN Entered By: Deon Pilling on 04/13/2022 15:30:16 -------------------------------------------------------------------------------- Problem List  Details Patient Name: Date of Service: [name], [name]. 04/13/2022 3:00 PM Medical Record Number: [id number] Patient Account Number: [id number] Date of Birth/Sex: Treating RN: [birth year] (  67 y.o. M) Primary Care Provider: [name] Other Clinician: Referring Provider: Treating Provider/Extender: [name] in Treatment: 36 Active Problems ICD-10 Encounter Code Description Active Date MDM Diagnosis I89.0 Lymphedema, not elsewhere classified 08/04/2021 No Yes I87.333 Chronic venous hypertension (idiopathic) with ulcer and inflammation of 08/04/2021 No Yes bilateral lower extremity [name], [name] ([id number]) [id number] Page 6 of 10 [phone] Non-pressure chronic ulcer of other part of left lower leg with other specified 08/04/2021 No Yes severity L97.818 Non-pressure chronic ulcer of other part of right lower leg with other specified 08/04/2021 No Yes severity E11.621 Type 2 diabetes mellitus with foot ulcer 08/04/2021 No Yes L97.518 Non-pressure chronic ulcer of other part of right foot with other specified 08/04/2021 No Yes severity Inactive Problems Resolved Problems Electronic Signature(s) Signed: 04/13/2022 2:48:50 PM By: [name] PA-C Entered By: [name] on 04/13/2022 14:48:50 -------------------------------------------------------------------------------- Progress Note Details Patient Name: Date of Service: [name], [name]. 04/13/2022 3:00 PM Medical Record Number: [id number] Patient Account Number: [id number] Date of Birth/Sex: Treating RN: 1955-12-01 (67 y.o. M) Primary Care Provider: [name] Other Clinician: Referring Provider: Treating Provider/Extender: [name] in Treatment: 36 Subjective Chief Complaint Information obtained from Patient 08/04/2021; patient returns to clinic with bilateral leg wounds as well as areas on the right foot History of Present Illness  (HPI) ADMISSION 03/22/2021 This is a 67 year old man with a past medical history significant for diabetes type 2, congestive heart failure, peripheral arterial disease, morbid obesity, venous insufficiency, and coronary artery disease. He has been followed by [name] in podiatry, who performed a transmetatarsal amputation on the left foot in August 2022. He had issues healing that wound, but based upon Dr. [name] notes, ultimately the TMA wound healed. During his recovery from that surgery, however, ulcers opened up over the DIP joint of the right second and third toe. These have apparently closed and reopened multiple times. It sounds like one of the issues has been moisture accumulation and maceration of the tissues causing them to reopen. At his last visit with [name], on March 01, 2021, there continues to be problems with moisture and he was referred to wound care for further evaluation and management. He had a formal aortogram with runoff performed prior to his TMA. The findings are copied here: Patient has inline flow to both feet with no significant flow-limiting lesion that would be amenable to percutaneous or open revascularization. He does have an element of small vessel disease and has a short segment occlusion of the distal anterior tibial/dorsalis pedis artery on the left foot but does have posterior tibial artery flow. Would recommend management of wounds with amputation of toes 2 and 3 on the right foot if the wounds do not heal and deteriorate. Transmetatarsal amputation on the left side has as good a blood supply as it is going to get and hopefully this will heal in the future. Formal ABIs were done in January 2023. They are normal bilaterally. ABI Findings: +---------+------------------+-----+----------+--------+ Right Rt Pressure (mmHg)IndexWaveform Comment  +---------+------------------+-----+----------+--------+ Brachial 160     +---------+------------------+-----+----------+--------+ PTA 192 1.06 monophasic  +---------+------------------+-----+----------+--------+ DP 159 0.88 monophasic  +---------+------------------+-----+----------+--------+ Great T oe145 0.80    +---------+------------------+-----+----------+--------+ ROSTON, REIMERS [name] ([id number]) [id number] Page 7 of 10 +--------+------------------+-----+---------+-------+ Left Lt Pressure (mmHg)IndexWaveform Comment +--------+------------------+-----+---------+-------+ [id number]    +--------+------------------+-----+---------+-------+ PTA 204 1.13 triphasic  +--------+------------------+-----+---------+-------+ DP 194 1.07 biphasic   +--------+------------------+-----+---------+-------+ +-------+-----------+-----------+------------+------------+ ABI/TBIT oday's ABIT oday's TBIPrevious ABIPrevious TBI +-------+-----------+-----------+------------+------------+ Right 1.06 0.8 1.26  0.65  +-------+-----------+-----------+------------+------------+ Left 1.13 amputation 1.15 amputation  +-------+-----------+-----------+------------+------------+ Previous ABI on 08/06/20 at [facility] pressures falsely elevated due to medial calcification. Summary: Right: Resting right ankle-brachial index is within normal range. The right toe-brachial index is normal. Left: Resting left ankle-brachial index is within normal range. READMISSION 08/04/2021 Mr. Reinsmith is now a 67 year old man who I remember from this clinic many years ago I think he had a right lower extremity predominantly venous wound at the time. He was here for 1 visit in March of this year had wounds on his right second and third toes we apparently dressed them many and they healed so he did not come back. He is listed in [place] is being a diabetic although the patient denies this says he is verified it with  his primary doctor. In any case over the last several weeks or so according the patient although these wounds look somewhat more chronic than that he has developed predominantly large wounds on the right medial and right lateral ankle smaller areas on the left leg and areas on the dorsal aspect of his right second and third toes. Its not clear how he has been dressing these. 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 The patient has known PAD. He had an angiogram in August 2022 at which time he had nonhealing wounds in both feet. On the left his major vessels in the thigh were all patent. He had three-vessel patent to the level of the ankle. He had a very short occlusion in the left anterior tibial. On the right lower extremity the major vessels in his thigh were all patent. He had three-vessel runoff to the foot sluggish filling of the anterior tibial artery. He was felt to have some component of small vessel disease but nothing that was amenable or needed revascularization. It was recommended that he have amputation of the second and third toes on the right foot if they did not heal He has been following with [name] of podiatry. [name] got him juxta lite stockings although I do not think he had them on properly he has uncontrolled edema in both legs Past medical history includes type 2 diabetes [although the patient really denies this], left TMA in 2022,lower extremity wounds in fact attendance at this clinic in 2009-2010, A-fib on Eliquis, chronic venous insufficiency with secondary lymphedema history of non-Hodgkin's lymphoma. 08-11-2021 upon evaluation today patient presents for follow-up evaluation he was seen last Wednesday initially for inspection here in our clinic. With that being said he tells me that he unfortunately has been having a lot of drainage and is actually coming through his wrap. Fortunately I do  not see any evidence of active infection locally or systemically at this time which is great news. No fevers, chills, nausea, vomiting, or diarrhea. With that being said there does appear to be some evidence of local infection based on what I am seeing today. 08-18-2021 upon evaluation today patient appears to be doing okay currently in regard to his wounds with that being said that he is doing much better but still has a long ways to go to get to where he wanted to be. I think the infection is significantly improved. He has another week of the antibiotic at this point. 08-25-2021 upon evaluation today patient's wounds are actually doing decently well he has erythema has significantly improved. I think the cellulitis is under controlled  I am going to continue him on 2 more weeks of the Levaquin at 500 mg this is a lower dose but I am hoping it will be better for him. 09-08-2021 upon evaluation today patient appears to be doing excellent in regard to his wounds. Since I last saw him he was actually in the hospital where he ended up having a pacemaker put in. Subsequently he tells me that he is actually doing quite well although they were unsure whether they were going to do it due to the fact that he had the wounds on his legs. And then I am glad they did anything seem to be doing well. 09-15-2021 upon evaluation patient's wounds are actually showing signs of improvement. The right side wounds do appear to have some need for sharp debridement today and I [name] go ahead and proceed with that. I think that if we get the wounds cleaned up he will actually show signs of continued improvement. I am also leaning towards switching to [facility] which I think will be a much better option for him. 09-22-2021 patient's wounds are showing signs of excellent improvement. I am actually extremely pleased with where we stand and I think that the patient is making great progress. There does not appear to be any signs of  active infection. 09-29-2021 upon evaluation today patient appears to be doing excellent in regard to his wounds. He is actually tolerating the dressing changes without complication. Fortunately I see no evidence of active infection locally or systemically at this time which is great news and overall I am extremely pleased with where we stand currently. 09-2721 upon evaluation today patient actually appears to be doing excellent in regard to his wounds. The left leg is almost completely healed the right ankle is significantly smaller. Overall I am extremely pleased with where we stand at this point. I do not see any evidence of active infection at this time. 10-13-2021 upon evaluation today patient appears to be doing excellent in regard to his wounds. I really feel like he is making good progress here and I am very pleased in that regard. Fortunately I do not see any signs of active infection at this time. We are using the [facility] topical antibiotic therapy. 10-20-2021 upon evaluation today patient appears to be doing well with regard to his wound on the right medial ankle region the left leg is almost completely healed. I am actually very pleased with where we stand today. 11-03-2021 upon evaluation today patient's wound actually is going require some sharp debridement but appears to be doing much better which is great news. Fortunately I do not see any signs of active infection at this time. [name], [name] ([id number]) [id number] Page 8 of 10 11-10-2021 upon evaluation today patient's wound is actually showing signs of excellent improvement. Fortunately I do not see any evidence of infection locally or systemically which is great news and overall I am extremely pleased with where we stand today. I do believe he is making good progress he does have his [facility] topical antibiotics with him here today. 11-24-2021 upon evaluation today patient actually showing signs of  excellent improvement this appears to be doing much better. Fortunately I do not see any evidence of infection locally or systemically at this time. 12-01-2021 upon evaluation today patient appears to be doing well currently in regard to his wound. He has been tolerating the dressing changes without complication. Fortunately I do not see any evidence of active infection at this time  which is great news and overall I am extremely pleased with where we stand today. 12-15-2021 upon evaluation today patient appears to be doing well with regard to his wound. He is showing signs of improvement is slow but nonetheless we are making improvements here. Fortunately I do not see any evidence of active infection locally nor systemically at this time. We are still using the [facility] topical antibiotic over the open area only. 12-29-2021 upon evaluation today patient's wound actually is showing signs of excellent improvement. It has been 2 weeks since I perform any debridement and it definitely shows he has some tissue that needs to be cleaned away but I think we can do so quite easily and readily today. The good news is I do not see any signs of infection I think he is doing much better in that regard. Overall I am extremely pleased with where we stand. 01-12-2022 upon evaluation today patient appears to be doing well currently in regard to his wound although it is not getting significantly smaller it is also not getting any larger. Fortunately I do not see any evidence of infection locally nor systemically which is great news and overall I am extremely pleased with where things stand currently. 01-19-2022 upon evaluation today patient appears to be doing well currently in regard to his wound. The PolyMem actually seems to have done extremely well for him. Fortunately I do not see any signs of infection locally nor systemically at this time. 01-26-2022 upon evaluation today patient appears to be making progress.  Fortunately there does not appear to be any signs of infection which is great news. No fevers, chills, nausea, vomiting, or diarrhea. With that being said this is very slow to heal and although it is smaller I still feel like we may want to try to do something to speed this up I think that a skin substitute could be beneficial, look into Kerecis. 02-02-2022 upon evaluation today patient's wound is actually showing signs of excellent improvement. I do not see any evidence of infection and overall I think that we are headed in the right direction. Fortunately I think that he is tolerating the dressing changes without complication. 02-09-2022 upon evaluation today patient appears to be doing well currently in regard to his wound. It does look a little bit macerated but fortunately does not appear to be showing any signs of significant skin breakdown over the macerated area which is good news. Fortunately I do not see any evidence of active infection locally nor systemically at this point which is great news. 02-23-2022 upon evaluation today patient appears to be doing well currently in regard to his wounds. In fact his area on the ankle is actually showing signs of healing quite nicely. We do have the Apligraf ready today I am hopeful this is going to speed things up and get this closed much more effectively and quickly. Fortunately I do not see any signs of active infection locally nor systemically at this time which is great news. 03-02-2022 upon evaluation today patient appears to be doing excellent in regard to his wound. He is actually been tolerating the dressing changes without complication the wound on the left medial lower extremity is doing quite well and Apligraf seems to have been extremely beneficial for him. I am extremely pleased with where we stand today. 03/16/2022: The wound measurements are smaller today. He has accumulation of a yellow crust around the edges and slough on the surface. There  is a musty odor  coming from the wound. He has been getting Apligraf. 03-30-2022 upon evaluation today patient appears to be making progress. In regard to his leg ulcer. This is slow but nonetheless the Apligraf has fed things up. Fortunately I do not see any evidence of infection locally nor systemically at this time. No fevers, chills, nausea, vomiting, or diarrhea. Patient is here for Apligraf #4 today. 04-13-2022 upon evaluation today patient appears to be doing a little bit more poorly in regard to his wound. He actually feels like his wrap may have been a little bit tight. Fortunately there does not appear to be any signs of active infection locally nor systemically which is great news and I am pleased in that regard. Objective Constitutional Chronically ill appearing but in no apparent acute distress. Vitals Time Taken: 3:09 PM, Height: 71 in, Weight: 350 lbs, BMI: 48.8, Temperature: 97.9 F, Pulse: 93 bpm, Respiratory Rate: 18 breaths/min, Blood Pressure: 144/85 mmHg. Respiratory normal breathing without difficulty. Psychiatric this patient is able to make decisions and demonstrates good insight into disease process. Alert and Oriented x 3. pleasant and cooperative. General Notes: Upon inspection patient's wound bed actually showed signs of good granulation epithelization at this point. Fortunately I do not see any signs of infection at this time which is great news I do believe that we are headed in the right direction in general. With that being said there is some irritation around the edges of the wound area and again I think this is due to drainage he did see his heart doctor he is actually up on the furosemide 40 mg 2 times a day. With that being said the patient tells me that he was told about 20 pounds of fluid according to the heart doctor. Nonetheless with this coming off I think that may improve things dramatically. Also think the patient could benefit from a continuation of  therapy with the Apligraf he had 1 left but I would like to hold off on that today. I am probably going to go ahead and not order this for next week just in case things or not doing as well as I would like we will plan to order it next week for the following if he looks to be doing well. He is in agreement with that plan. Integumentary (Hair, Skin) Wound #7 status is Open. Original cause of wound was Blister. The date acquired was: 07/21/2021. The wound has been in treatment 36 weeks. The wound is located on the Right,Medial Malleolus. The wound measures 4cm length x 4.2cm width x 0.2cm depth; 13.195cm^2 area and 2.639cm^3 volume. There is a medium amount of serosanguineous drainage noted. ISHAAN, STINGER ([id number]) [id number] Page 9 of 10 Assessment Active Problems ICD-10 Lymphedema, not elsewhere classified Chronic venous hypertension (idiopathic) with ulcer and inflammation of bilateral lower extremity Non-pressure chronic ulcer of other part of left lower leg with other specified severity Non-pressure chronic ulcer of other part of right lower leg with other specified severity Type 2 diabetes mellitus with foot ulcer Non-pressure chronic ulcer of other part of right foot with other specified severity Procedures Wound #7 Pre-procedure diagnosis of Wound #7 is a Venous Leg Ulcer located on the Right,Medial Malleolus .Severity of Tissue Pre Debridement is: Fat layer exposed. There was a Excisional Skin/Subcutaneous Tissue Debridement with a total area of 18.9 sq cm performed by [name], PA. With the following instrument(s): Curette to remove Viable and Non-Viable tissue/material. Material removed includes Callus, Subcutaneous Tissue, Slough, Skin: Dermis, Skin: Epidermis,  and Biofilm after achieving pain control using Lidocaine 5% topical ointment. A time out was conducted at 15:15, prior to the start of the procedure. A Minimum amount of bleeding was  controlled with Pressure. The procedure was tolerated well with a pain level of 0 throughout and a pain level of 0 following the procedure. Post Debridement Measurements: 4cm length x 4.2cm width x 0.2cm depth; 2.639cm^3 volume. Character of Wound/Ulcer Post Debridement is improved. Severity of Tissue Post Debridement is: Fat layer exposed. Post procedure Diagnosis Wound #7: Same as Pre-Procedure Pre-procedure diagnosis of Wound #7 is a Venous Leg Ulcer located on the Right,Medial Malleolus . There was a Double Layer Compression Therapy Procedure by Erenest Blank. Post procedure Diagnosis Wound #7: Same as Pre-Procedure Plan Follow-up Appointments: Return Appointment in 1 week. Margarita Grizzle Wednesday 04/20/2022 3pm room 9 Return Appointment in 2 weeks. Margarita Grizzle Wednesday 04/27/2022 3pm room 9 Anesthetic: (In clinic) Topical Lidocaine 5% applied to wound bed Cellular or Tissue Based Products: Cellular or Tissue Based Product Type: - Run IVR for Kerrecis- denied #1 Apligraf applied 02/23/2022 #2 Apligraf applied 03/02/2022 #3 Apligraf applied 03/16/2022 #4 Apligraf applied 03/30/2022 HOLD APLIGRAF this week. Cellular or Tissue Based Product applied to wound bed, secured with steri-strips, cover with Adaptic or Mepitel. (DO NOT REMOVE). Bathing/ Shower/ Hygiene: May shower with protection but do not get wound dressing(s) wet. Protect dressing(s) with water repellant cover (for example, large plastic bag) or a cast cover and may then take shower. Edema Control - Lymphedema / SCD / Other: Elevate legs to the level of the heart or above for 30 minutes daily and/or when sitting for 3-4 times a day throughout the day. Avoid standing for long periods of time. Exercise regularly Moisturize legs daily. - apply every night before bed to left leg. Compression stocking or Garment 30-40 mm/Hg pressure to: - wear your VIVE compression stocking to left leg. apply in the morning and remove at night. Will apply tubigrip  size [name] to left leg. Remove once you get home and use your compression stockings. WOUND #7: - Malleolus Wound Laterality: Right, Medial Peri-Wound Care: Triamcinolone 15 (g) 1 x Per Week/30 Days Discharge Instructions: Use triamcinolone 15 (g) as directed Peri-Wound Care: Sween Lotion (Moisturizing lotion) 1 x Per Week/30 Days Discharge Instructions: Apply moisturizing lotion as directed Prim Dressing: Maxorb Extra Ag+ Alginate Dressing, 2x2 (in/in) 1 x Per Week/30 Days ary Discharge Instructions: Apply to wound bed as instructed Secondary Dressing: Woven Gauze Sponge, Non-Sterile 4x4 in 1 x Per Week/30 Days Discharge Instructions: Apply over primary dressing as directed. Secondary Dressing: Zetuvit Plus 4x8 in 1 x Per Week/30 Days Discharge Instructions: Apply over primary dressing as directed. Com pression Wrap: ThreePress (3 layer compression wrap) 1 x Per Week/30 Days Discharge Instructions: Apply three layer use kerlix instead of cotton compression as directed. 1. I am good recommend currently that we actually go back to the silver alginate dressing along with the Zetuvit to cover. Were also doing the 4-layer compression wrap that seems to be doing a great job for him. 2. I am also can recommend that the patient should continue to monitor for any signs of worsening or infection. Obviously if anything changes he can contact the office me know but in general I believe that we need to ensure this is not infected and the best way to do that is really going to be given this a little bit of time and not utilizing the compression wrap at this point. Patient  is perfectly fine with this. We will see patient back for reevaluation in 1 week here in the clinic. If anything worsens or changes patient will contact our office for additional recommendations. NAMARI, DOSWELL ([id number]) [id number] Page 10 of 10 Electronic Signature(s) Signed: 04/13/2022 4:04:45 PM By: [name] PA-C Entered By: [name] on 04/13/2022 16:04:45 -------------------------------------------------------------------------------- SuperBill Details Patient Name: Date of Service: [place], [name]. 04/13/2022 Medical Record Number: [id number] Patient Account Number: [id number] Date of Birth/Sex: Treating RN: 14-May-1955 (67 y.o. [name], [name] Primary Care Provider: [name] Other Clinician: Referring Provider: Treating Provider/Extender: [name] in Treatment: 36 Diagnosis Coding ICD-10 Codes Code Description I89.0 Lymphedema, not elsewhere classified I87.333 Chronic venous hypertension (idiopathic) with ulcer and inflammation of bilateral lower extremity L97.828 Non-pressure chronic ulcer of other part of left lower leg with other specified severity L97.818 Non-pressure chronic ulcer of other part of right lower leg with other specified severity E11.621 Type 2 diabetes mellitus with foot ulcer L97.518 Non-pressure chronic ulcer of other part of right foot with other specified severity Facility Procedures : 3 CPT4 Code: [id number] Description: [facility] - DEB SUBQ TISSUE 20 SQ CM/< ICD-10 Diagnosis Description L97.818 Non-pressure chronic ulcer of other part of right lower leg with other specified Modifier: severity Quantity: 1 Physician Procedures : CPT4 Code Description Modifier [id number] - WC PHYS SUBQ TISS 20 SQ CM ICD-10 Diagnosis Description L97.818 Non-pressure chronic ulcer of other part of right lower leg with other specified severity Quantity: 1 Electronic Signature(s) Signed: 04/13/2022 4:06:49 PM By: [name] PA-C Entered By: [name] on 04/13/2022 16:06:49

## 2022-04-13 NOTE — Progress Notes (Signed)
[name], [name] ([id number]) [id number] Page 1 of 3 Visit Report for 04/06/2022 Arrival Information Details Patient Name: Date of Service: [name], [name] 04/06/2022 3:00 PM Medical Record Number: [id number] Patient Account Number: [id number] Date of Birth/Sex: Treating RN: [birth year] (67 y.o. [name], [name] Primary Care Aarianna Hoadley: [name] Other Clinician: Referring Clarity Ciszek: Treating Daaiel Starlin/Extender: [name] in Treatment: 37 Visit Information History Since Last Visit Added or deleted any medications: No Patient Arrived: Ambulatory Any new allergies or adverse reactions: No Arrival Time: 15:39 Had a fall or experienced change in No Accompanied By: self activities of daily living that may affect Transfer Assistance: Manual risk of falls: Patient Identification Verified: Yes Signs or symptoms of abuse/neglect since last visito No Secondary Verification Process Completed: Yes Hospitalized since last visit: No Patient Requires Transmission-Based Precautions: No Implantable device outside of the clinic excluding No Patient Has Alerts: Yes cellular tissue based products placed in the center Patient Alerts: Patient on Blood Thinner since last visit: 01/2021 ABI L 1.13 R 1.06 Has Dressing in Place as Prescribed: Yes 01/2021 TBI L amp R 0.8 Has Compression in Place as Prescribed: Yes Pain Present Now: No Electronic Signature(s) Signed: 04/13/2022 4:26:45 PM By: Rhae Hammock RN Entered By: Rhae Hammock on 04/06/2022 15:39:27 -------------------------------------------------------------------------------- Compression Therapy Details Patient Name: Date of Service: [name], [name] 04/06/2022 3:00 PM Medical Record Number: [id number] Patient Account Number: [id number] Date of Birth/Sex: Treating RN: 1955/08/24 (67 y.o. [name] Noe Primary Care Syan Cullimore: [name] Other Clinician: Referring  Adonte Vanriper: Treating Vertis Scheib/Extender: [name] in Treatment: 35 Compression Therapy Performed for Wound Assessment: Wound #7 Right,Medial Malleolus Performed By: Clinician Rhae Hammock, RN Compression Type: Three Hydrologist) Signed: 04/13/2022 4:26:45 PM By: Rhae Hammock RN Entered By: Rhae Hammock on 04/06/2022 15:40:33 -------------------------------------------------------------------------------- Encounter Discharge Information Details Patient Name: Date of Service: [name], [name] 04/06/2022 3:00 PM Medical Record Number: [id number] Patient Account Number: [id number] Date of Birth/Sex: Treating RN: 07/26/1955 (67 y.o. [name], [name] Primary Care Aislin Onofre: [name] Other Clinician: Referring Juliany Daughety: Treating Najla Aughenbaugh/Extender: [name] in Treatment: [phone] Encounter Discharge Information Items Discharge Condition: Stable Ambulatory Status: Walker Discharge Destination: Home Transportation: Private Auto Accompanied By: self Schedule Follow-up Appointment: Yes Clinical Summary of Care: Patient [name], [name] ([id number]) [id number] Page 2 of 3 Electronic Signature(s) Signed: 04/13/2022 4:26:45 PM By: Rhae Hammock RN Entered By: Rhae Hammock on 04/06/2022 15:41:03 -------------------------------------------------------------------------------- Patient/Caregiver Education Details Patient Name: Date of Service: [name], DA [name] November 3/27/2024andnbsp3:00 PM Medical Record Number: [id number] Patient Account Number: [id number] Date of Birth/Gender: Treating RN: 10/27/1955 (67 y.o. [name] Noe Primary Care Physician: [name] Other Clinician: Referring Physician: Treating Physician/Extender: [name] in Treatment: 22 Education Assessment Education Provided To: Patient Education Topics Provided Wound/Skin  Impairment: Methods: Explain/Verbal Responses: Reinforcements needed, State content correctly [employer]) Signed: 04/13/2022 4:26:45 PM By: Rhae Hammock RN Entered By: Rhae Hammock on 04/06/2022 15:40:50 -------------------------------------------------------------------------------- Wound Assessment Details Patient Name: Date of Service: [name], [name] 04/06/2022 3:00 PM Medical Record Number: [id number] Patient Account Number: [id number] Date of Birth/Sex: Treating RN: 1955-03-05 (67 y.o. [name], [name] Primary Care Tait Balistreri: [name] Other Clinician: Referring Benny Henrie: Treating Shavell Nored/Extender: [name] in Treatment: 35 Wound Status Wound Number: 7 Primary Etiology: Venous Leg Ulcer Wound Location: Right, Medial Malleolus Secondary Etiology: Lymphedema Wounding Event: Blister Wound Status: Open Date Acquired: 07/21/2021 Weeks Of Treatment:  35 Clustered Wound: No Wound Measurements Length: (cm) 4.1 Width: (cm) 1.6 Depth: (cm) 0.2 Area: (cm) 5.152 Volume: (cm) 1.03 % Reduction in Area: 86.5% % Reduction in Volume: 96.2% Wound Description Classification: Full Thickness With Exposed Support S Exudate Amount: Medium Exudate Type: Serosanguineous Exudate Color: red, brown tructures Periwound Skin Texture Texture Color No Abnormalities Noted: No No Abnormalities Noted: No Moisture No Abnormalities Noted: No [name], [name] ([id number] Page 3 of 3 Electronic Signature(s) Signed: 04/13/2022 4:26:45 PM By: Rhae Hammock RN Entered By: Rhae Hammock on 04/06/2022 15:39:43 -------------------------------------------------------------------------------- Vitals Details Patient Name: Date of Service: [name], [name] 04/06/2022 3:00 PM Medical Record Number: [id number] Patient Account Number: [id number] Date of Birth/Sex: Treating RN: [birth year] (67 y.o. [name],  [name] Primary Care Kassadi Presswood: [name] Other Clinician: Referring Raliyah Montella: Treating Commodore Bellew/Extender: [name] in Treatment: 35 Vital Signs Time Taken: 15:39 Reference Range: 80 - 120 mg / dl Height (in): 71 Weight (lbs): 350 Body Mass Index (BMI): 48.8 Electronic Signature(s) Signed: 04/13/2022 4:26:45 PM By: Rhae Hammock RN Entered By: Rhae Hammock on 04/06/2022 15:39:34

## 2022-04-14 NOTE — Progress Notes (Signed)
[name] [name] ([id number]) [id number] Page 1 of 5 Visit Report for 04/13/2022 Arrival Information Details Patient Name: Date of Service: [name] [name] 04/13/2022 3:00 PM Medical Record Number: [id number] Patient Account Number: [id number] Date of Birth/Sex: Treating RN: [name] (67 y.o. M) Primary Care [name] [name]: [name] [name] [name] [name]: Referring [name] [name]: Treating [name] [name]/Extender: [name] [name] in Treatment: 36 Visit Information History Since Last Visit Added or deleted any medications: No Patient Arrived: [name] [name] Any new allergies or adverse reactions: No Arrival Time: 15:09 Had a fall or experienced change in No Accompanied By: self activities of daily living that may affect Transfer Assistance: None risk of falls: Patient Identification Verified: Yes Signs or symptoms of abuse/neglect since last visito No Secondary Verification Process Completed: Yes Hospitalized since last visit: No Patient Requires Transmission-Based Precautions: No Implantable device outside of the clinic excluding No Patient Has Alerts: Yes cellular tissue based products placed in the center Patient Alerts: Patient on Blood Thinner since last visit: 01/2021 ABI L 1.13 R 1.06 Has Dressing in Place as Prescribed: Yes 01/2021 TBI L amp R 0.8 Pain Present Now: Yes Electronic Signature(s) Signed: 04/14/2022 11:27:10 AM By: [name] [name] Entered By: [name] [name] on 04/13/2022 15:09:18 -------------------------------------------------------------------------------- Compression Therapy Details Patient Name: Date of Service: [name] [name], [name] [name] 04/13/2022 3:00 PM Medical Record Number: [id number] Patient Account Number: [id number] Date of Birth/Sex: Treating RN: [birth year] (67 y.o. [name] [name] Primary Care [name] [name]: [name] [name] [name] [name]: Referring [name] [name]: Treating [name] [name]/Extender: [name] [name] in Treatment:  36 Compression Therapy Performed for Wound Assessment: Wound #7 Right,Medial Malleolus Performed By: [name] [name] [name], Compression Type: Double Layer Post Procedure Diagnosis Same as Pre-procedure Electronic Signature(s) Signed: 04/13/2022 4:55:19 PM By: [name] [name] Entered By: [name] Pilling on 04/13/2022 15:29:03 -------------------------------------------------------------------------------- Encounter Discharge Information Details Patient Name: Date of Service: [name] [name], [name] [name] 04/13/2022 3:00 PM Medical Record Number: [id number] Patient Account Number: [id number] Date of Birth/Sex: Treating RN: [birth year] (67 y.o. [name] [name] Primary Care Georgio Hattabaugh: [name] [name] [name] [name]: Referring [name] [name]: Treating Calee Nugent/Extender: [name] [name] in Treatment: 36 Encounter Discharge Information Items Post Procedure Vitals Discharge Condition: Stable Temperature (F): 97.9 Ambulatory Status: Walker Pulse (bpm): 93 Discharge Destination: Home Respiratory Rate (breaths/min): 18 Transportation: Private Auto Blood Pressure (mmHg): 144/85 Accompanied By: self [name] [name] ([id number]) [id number] Page 2 of 5 Schedule Follow-up Appointment: Yes Clinical Summary of Care: Electronic Signature(s) Signed: 04/13/2022 4:55:19 PM By: [name] [name] Entered By: [name] Pilling on 04/13/2022 15:31:49 -------------------------------------------------------------------------------- Multi-Disciplinary Care Plan Details Patient Name: Date of Service: [name], [name] [name] 04/13/2022 3:00 PM Medical Record Number: [id number] Patient Account Number: [id number] Date of Birth/Sex: Treating RN: [name] [name] (67 y.o. [name] [name] Primary Care Leeum Sankey: [name] [name] [name] [name]: Referring [name] [name]: Treating [name] [name]/Extender: [name] [name] in Treatment: [place] reviewed with physician Active  Inactive Pain, Acute or Chronic Nursing Diagnoses: Pain, acute or chronic: actual or potential Potential alteration in comfort, pain Goals: Patient will verbalize adequate pain control and receive pain control interventions during procedures as needed Date Initiated: 08/04/2021 Target Resolution Date: 05/13/2022 Goal Status: Active Patient/caregiver will verbalize comfort level met Date Initiated: 08/04/2021 Target Resolution Date: 05/13/2022 Goal Status: Active Interventions: Encourage patient to take pain medications as prescribed Provide education on pain management Reposition patient for comfort Treatment Activities: Administer pain control measures as ordered : 08/04/2021 Notes: Electronic Signature(s) Signed: 04/13/2022 4:55:19 PM By: [name] Pilling RN,  [name] Entered By: [name] Pilling on 04/13/2022 15:21:32 -------------------------------------------------------------------------------- Pain Assessment Details Patient Name: Date of Service: [name] [name], [name] D. 04/13/2022 3:00 PM Medical Record Number: [name] [name] Patient Account Number: [id number] Date of Birth/Sex: Treating RN: [birth year] (67 y.o. M) Primary Care [name] [name]: [name] [name] [name] [name]: Referring [name] [name]: Treating [name] [name]: [name] [name] in Treatment: 36 Active Problems Location of Pain Severity and Description of Pain Patient Has Paino Yes Site Locations Rate the pain. [name] [name], [name] [name] ([name] [name]) [id number] Page 3 of 5 Rate the pain. Current Pain Level: 3 Pain Management and Medication Current Pain Management: Electronic Signature(s) Signed: 04/14/2022 11:27:10 AM By: [name] [name] Entered By: [name] [name] on 04/13/2022 15:09:39 -------------------------------------------------------------------------------- Patient/Caregiver Education Details Patient Name: Date of Service: [name] [name], [name] [name] November 4/3/2024andnbsp3:00 PM Medical Record Number:  [name] [name] Patient Account Number: [id number] Date of Birth/Gender: Treating RN: [birth year] (67 y.o. [name] [name] Primary Care Physician: [name] [name] [name] [name]: Referring Physician: Treating Physician/Extender: [name] [name] in Treatment: 36 Education Assessment Education Provided To: Patient Education Topics Provided Wound/Skin Impairment: Handouts: Caring for Your Ulcer Methods: Explain/Verbal Responses: Reinforcements needed Electronic Signature(s) Signed: 04/13/2022 4:55:19 PM By: [name] [name] Entered By: [name] Pilling on 04/13/2022 15:21:46 -------------------------------------------------------------------------------- Wound Assessment Details Patient Name: Date of Service: [name] [name], [name] [name] 04/13/2022 3:00 PM Medical Record Number: [name] [name] Patient Account Number: [id number] Date of Birth/Sex: Treating RN: [birth year][name] (67 y.o. M) Primary Care Deondre Marinaro: [name] [name] [name] [name]: Referring Cleatus Gabriel: Treating [place] Winslow/Extender: [name] [name] in Treatment: 36 Wound Status Wound Number: 7 Primary Venous Leg Ulcer Etiology: Wound Location: Right, Medial Malleolus Secondary Lymphedema Wounding Event: Blister Etiology: Date Acquired: 07/21/2021 Wound Open Weeks Of Treatment: 36 Status: [name] [name], [name] D ([name] [name]) [id number] Page 4 of 5 Status: Clustered Wound: No Comorbid Sleep Apnea, Arrhythmia, Congestive Heart Failure, History: Hypertension, Hypotension, Peripheral Arterial Disease, Peripheral Venous Disease, Type II Diabetes, Gout, Osteoarthritis Photos Wound Measurements Length: (cm) 4 Width: (cm) 4.2 Depth: (cm) 0.2 Area: (cm) 13.195 Volume: (cm) 2.639 % Reduction in Area: 65.5% % Reduction in Volume: 90.2% Wound Description Classification: Full Thickness With Exposed Support Exudate Amount: Medium Exudate Type: Serosanguineous Exudate Color: red,  brown Structures Periwound Skin Texture Texture Color No Abnormalities Noted: No No Abnormalities Noted: No Moisture No Abnormalities Noted: No Treatment Notes Wound #7 (Malleolus) Wound Laterality: Right, Medial Cleanser Peri-Wound Care Triamcinolone 15 (g) Discharge Instruction: Use triamcinolone 15 (g) as directed Sween Lotion (Moisturizing lotion) Discharge Instruction: Apply moisturizing lotion as directed Topical Primary Dressing Maxorb Extra Ag+ Alginate Dressing, 2x2 (in/in) Discharge Instruction: Apply to wound bed as instructed Secondary Dressing Woven Gauze Sponge, Non-Sterile 4x4 in Discharge Instruction: Apply over primary dressing as directed. Zetuvit Plus 4x8 in Discharge Instruction: Apply over primary dressing as directed. Secured With Compression Wrap ThreePress (3 layer compression wrap) Discharge Instruction: Apply three layer use kerlix instead of cotton compression as directed. Compression Stockings Add-Ons Electronic Signature(s) Signed: 04/14/2022 11:27:10 AM By: Sylvan Cheese, Kasandra Knudsen D ([name] [name]) [id number] Page 5 of 5 Entered By: [name] [name] on 04/13/2022 15:03:40 -------------------------------------------------------------------------------- Vitals Details Patient Name: Date of Service: [name] [name], [name] D. 04/13/2022 3:00 PM Medical Record Number: [name] [name] Patient Account Number: [id number] Date of Birth/Sex: Treating RN: [birth year] (67 y.o. M) Primary Care Umar Patmon: [name] [name] [name] [name]: Referring Dartagnan Beavers: Treating Liviah Cake/Extender: [name] [name] in Treatment: 36 Vital Signs Time Taken: 15:09 Temperature (F): 97.9 Height (in): 71 Pulse (bpm): 93 Weight (lbs): 350 Respiratory Rate (breaths/min): 18  Body Mass Index (BMI): 48.8 Blood Pressure (mmHg): 144/85 Reference Range: 80 - 120 mg / dl Electronic Signature(s) Signed: 04/14/2022 11:27:10 AM By: [name] [name] Entered By: [name] [name] on 04/13/2022 15:09:32

## 2022-04-18 ENCOUNTER — Ambulatory Visit: Payer: Medicare Other | Admitting: Pulmonary Disease

## 2022-04-18 ENCOUNTER — Encounter: Payer: Self-pay | Admitting: Pulmonary Disease

## 2022-04-18 VITALS — BP 122/74 | HR 94 | Temp 97.9°F | Ht 71.0 in | Wt 307.8 lb

## 2022-04-18 DIAGNOSIS — J849 Interstitial pulmonary disease, unspecified: Secondary | ICD-10-CM | POA: Diagnosis not present

## 2022-04-18 DIAGNOSIS — G4733 Obstructive sleep apnea (adult) (pediatric): Secondary | ICD-10-CM | POA: Diagnosis not present

## 2022-04-18 NOTE — Patient Instructions (Signed)
I am glad your breathing is better Will check your oxygen levels on exertion today Order follow-up high-res CT in 4 months Return to clinic after CT in 4 months

## 2022-04-18 NOTE — Progress Notes (Signed)
[name]    [id number]    1955/10/08  Primary Care Physician:[name], Netta Corrigan., MD  Referring Physician: [name], MD [address] [place],  [place] [address]  Chief complaint: Follow up for interstitial lung disease  HPI: 67 y.o. who  has a past medical history of Acute systolic HF (heart failure), Arthritis, Atrial fibrillation, Diabetes mellitus, new onset, Diverticulosis, Dyslipidemia, Gout attack (01/16/2012), Hypertension, Internal hemorrhoids, Lymphoma, Pneumonia due to COVID-19 virus (07/2018), Pulmonary hypertension, Sepsis (02/2017), Tubular adenoma of colon, and Venous stasis ulcers.   History notable for hospitalization in February 2019 with ARDS following influenza.  Patient was not tested for COVID and believes that he may have had COVID infection during early months of the pandemic.  He also had newly diagnosed systolic CHF with EF 25%.  Had a tracheostomy which was eventually decannulated on March 22, 2017.  Other history of OSA, on CPAP.  Needs a new mask as he is lost some weight  Follows with cardiology for chronic systolic biventricular heart failure with a EF 25-30%, bradycardia, BiV ICD, chronic A-fib.  Diagnosed with lymphoma s/p R-CHOP in 2011.  Referred for evaluation of interstitial lung disease noted on screening CT of the chest.  Has chronic dyspnea on exertion which is slightly better now after he got the pacer/ICD and weight loss.  Denies any cough, congestion, sputum production.  Pets: Dogs, cats in the past Occupation: Works as a Economist Exposures: Exposure to some hair spray at work.  No mold, hot tub, Jacuzzi.  He has down pillows that he has been using for many years.  Got rid of down pillows in October 2023 Smoking history: 10-pack-year smoker.  Quit in 2010 Travel history: No significant travel history Relevant family history: No family history of lung disease  Interim history: He got rid of his down  pillow in October 2023.  He is given a prednisone course for a month which he completed.  Also being followed by cardiology for heart failure.  Noted to have volume overload and was placed on aggressive diuresis and lost 30 pounds with improvement in dyspnea.  Outpatient Encounter Medications as of 04/18/2022  Medication Sig   acetaminophen (TYLENOL) 325 MG tablet Take 650 mg by mouth every 6 (six) hours as needed for moderate pain.    ascorbic acid (VITAMIN C) 500 MG tablet Take 1 tablet (500 mg total) by mouth 3 (three) times daily. (Patient taking differently: Take 500 mg by mouth 2 (two) times daily.)   aspirin 81 MG chewable tablet Chew 1 tablet (81 mg total) by mouth daily.   atorvastatin (LIPITOR) 40 MG tablet TAKE 1 TABLET BY MOUTH EVERY DAY   carvedilol (COREG) 3.125 MG tablet Take 1 tablet (3.125 mg total) by mouth 2 (two) times daily.   cholecalciferol (VITAMIN D) 25 MCG (1000 UNIT) tablet Take 1 tablet (1,000 Units total) by mouth daily.   colchicine 0.6 MG tablet Take 0.6 mg by mouth 2 (two) times daily as needed.   dapagliflozin propanediol (FARXIGA) 10 MG TABS tablet Take 1 tablet (10 mg total) by mouth daily.   ELIQUIS 5 MG TABS tablet TAKE 1 TABLET BY MOUTH TWICE A DAY   Omega 3 340 MG CPDR Take 1 capsule (340 mg total) by mouth 2 (two) times daily. (Patient taking differently: Take 340 mg by mouth daily.)   omeprazole (PRILOSEC) 40 MG capsule Take 1 capsule (40 mg total) by mouth  daily.   potassium chloride SA (KLOR-CON M) 20 MEQ tablet Take 2 tablets (40 mEq total) by mouth daily.   sacubitril-valsartan (ENTRESTO) 97-103 MG Take 1 tablet by mouth 2 (two) times daily.   sertraline (ZOLOFT) 50 MG tablet Take 1 tablet (50 mg total) by mouth daily.   spironolactone (ALDACTONE) 25 MG tablet Take 1 tablet (25 mg total) by mouth daily.   torsemide (DEMADEX) 20 MG tablet Take 2 tablets (40 mg total) by mouth daily.   vitamin B-12 (CYANOCOBALAMIN) 1000 MCG tablet Take 1 tablet (1,000  mcg total) by mouth daily.   [DISCONTINUED] Continuous Blood Gluc Receiver (FREESTYLE LIBRE 2 READER) DEVI 1 each by Does not apply route daily.   [DISCONTINUED] Continuous Blood Gluc Sensor (FREESTYLE LIBRE 2 SENSOR) MISC 1 each by Does not apply route daily.   [DISCONTINUED] sodium chloride (OCEAN) 0.65 % SOLN nasal spray Place 2 sprays into both nostrils as needed for congestion.   No facility-administered encounter medications on file as of 04/18/2022.   Physical Exam: Blood pressure 122/74, pulse 94, temperature 97.9 F (36.6 C), temperature source Oral, height 5\' 11"  (1.803 m), weight (!) 307 lb 12.8 oz (139.6 kg), SpO2 95 %. Gen:      No acute distress HEENT:  EOMI, sclera anicteric Neck:     No masses; no thyromegaly Lungs:    Clear to auscultation bilaterally; normal respiratory effort CV:         Regular rate and rhythm; no murmurs Abd:      + bowel sounds; soft, non-tender; no palpable masses, no distension Ext:    No edema; adequate peripheral perfusion Skin:      Warm and dry; no rash Neuro: alert and oriented x 3 Psych: normal mood and affect   Data Reviewed: Imaging: Screening CT chest 03/01/2021-patchy thickening of peribronchovascular interstitium with groundglass attenuation, septal thickening in the mid to upper lungs.  This is new compared to prior.    High resolution CT 11/11/2021- Groundglass attenuation, septal thickening with mild air trapping.  Alternate pattern I have reviewed the images personally.  PFTs: 06/19/2017 FVC 3.90 [82%], FEV1 3.35 [94%], F/F 86, TLC 5.13 [73%], DLCO 16.92 [52%] Minimal restriction, moderate diffusion defect  11/22/2021 FVC 3.14 [68%], FEV1 2.29 [61%], TLC 4.81 [68%], DLCO 16.04 [60%]   Labs: CTD serologies 09/27/2021-negative IgE 09/27/2021-2791  Assessment:  Follow-up for interstitial lung disease His CT scan which is a low-dose screening CT does show some vague changes of upper lobe predominant groundglass opacification and  interstitial thickening.  This is not very evident from prior CT scans.  He does have a hospitalization in 2019 for severe ARDS secondary to influenza, suspected COVID-19 and also ongoing exposure to down pillows for many years but this change appears to be new  We had a detailed discussion about this in office today.  He does not want to get lung biopsy or bronchoscopy for BAL.  He is finished a steroid course and gotten rid of down exposure Follow-up CT in 4 months If there is progression in spite of allergen avoidance and steroids then will reconsider biopsy.  OSA On CPAP per his primary care.  Download reviewed with good compliance  Plan/Recommendations: Allergen avoidance Follow-up CT in 4 months.  Chilton Greathouse MD [facility] 04/18/2022, 2:51 PM  CC: [name], MD

## 2022-04-20 ENCOUNTER — Encounter (HOSPITAL_BASED_OUTPATIENT_CLINIC_OR_DEPARTMENT_OTHER): Payer: Medicare Other | Admitting: Physician Assistant

## 2022-04-20 DIAGNOSIS — L97312 Non-pressure chronic ulcer of right ankle with fat layer exposed: Secondary | ICD-10-CM | POA: Diagnosis not present

## 2022-04-20 DIAGNOSIS — L97518 Non-pressure chronic ulcer of other part of right foot with other specified severity: Secondary | ICD-10-CM | POA: Diagnosis not present

## 2022-04-20 DIAGNOSIS — E1151 Type 2 diabetes mellitus with diabetic peripheral angiopathy without gangrene: Secondary | ICD-10-CM | POA: Diagnosis not present

## 2022-04-20 DIAGNOSIS — E11621 Type 2 diabetes mellitus with foot ulcer: Secondary | ICD-10-CM | POA: Diagnosis not present

## 2022-04-20 DIAGNOSIS — L97828 Non-pressure chronic ulcer of other part of left lower leg with other specified severity: Secondary | ICD-10-CM | POA: Diagnosis not present

## 2022-04-20 DIAGNOSIS — I509 Heart failure, unspecified: Secondary | ICD-10-CM | POA: Diagnosis not present

## 2022-04-20 DIAGNOSIS — Z6841 Body Mass Index (BMI) 40.0 and over, adult: Secondary | ICD-10-CM | POA: Diagnosis not present

## 2022-04-20 DIAGNOSIS — L97818 Non-pressure chronic ulcer of other part of right lower leg with other specified severity: Secondary | ICD-10-CM | POA: Diagnosis not present

## 2022-04-20 DIAGNOSIS — I2510 Atherosclerotic heart disease of native coronary artery without angina pectoris: Secondary | ICD-10-CM | POA: Diagnosis not present

## 2022-04-20 DIAGNOSIS — I872 Venous insufficiency (chronic) (peripheral): Secondary | ICD-10-CM | POA: Diagnosis not present

## 2022-04-20 DIAGNOSIS — I890 Lymphedema, not elsewhere classified: Secondary | ICD-10-CM | POA: Diagnosis not present

## 2022-04-20 DIAGNOSIS — I87333 Chronic venous hypertension (idiopathic) with ulcer and inflammation of bilateral lower extremity: Secondary | ICD-10-CM | POA: Diagnosis not present

## 2022-04-20 DIAGNOSIS — I110 Hypertensive heart disease with heart failure: Secondary | ICD-10-CM | POA: Diagnosis not present

## 2022-04-20 NOTE — Progress Notes (Signed)
[name] ([id number]) [id number] Page 1 of 10 Visit Report for 04/20/2022 Chief Complaint Document Details Patient Name: Date of Service: [name], [name] 04/20/2022 3:00 PM Medical Record Number: [id number] Patient Account Number: [id number] Date of Birth/Sex: Treating RN: 06-28-1955 (67 y.o. M) Primary Care Provider: [name] Other Clinician: Referring Provider: Treating Provider/Extender: Alver Fisher in Treatment: 37 Information Obtained from: Patient Chief Complaint 08/04/2021; patient returns to clinic with bilateral leg wounds as well as areas on the right foot Electronic Signature(s) Signed: 04/20/2022 3:07:22 PM By: Allen Derry PA-C Entered By: Allen Derry on 04/20/2022 15:07:22 -------------------------------------------------------------------------------- Debridement Details Patient Name: Date of Service: [name], [name] 04/20/2022 3:00 PM Medical Record Number: [id number] Patient Account Number: [id number] Date of Birth/Sex: Treating RN: 1955/12/28 (67 y.o. Harlon Flor, Millard.Loa Primary Care Provider: [name] Other Clinician: Referring Provider: Treating Provider/Extender: Alver Fisher in Treatment: 37 Debridement Performed for Assessment: Wound #7 Right,Medial Malleolus Performed By: Physician Lenda Kelp, PA Debridement Type: Debridement Severity of Tissue Pre Debridement: Fat layer exposed Level of Consciousness (Pre-procedure): Awake and Alert Pre-procedure Verification/Time Out Yes - 15:05 Taken: Start Time: 15:06 Pain Control: Lidocaine 5% topical ointment T Area Debrided (L x W): otal 4.5 (cm) x 3 (cm) = 13.5 (cm) Tissue and other material debrided: Viable, Non-Viable, Callus, Slough, Subcutaneous, Skin: Dermis , Skin: Epidermis, Slough Level: Skin/Subcutaneous Tissue Debridement Description: Excisional Instrument: Curette Bleeding: Minimum Hemostasis Achieved:  Pressure End Time: 15:14 Procedural Pain: 0 Post Procedural Pain: 0 Response to Treatment: Procedure was tolerated well Level of Consciousness (Post- Awake and Alert procedure): Post Debridement Measurements of Total Wound Length: (cm) 4.4 Width: (cm) 2.9 Depth: (cm) 0.2 Volume: (cm) 2.004 Character of Wound/Ulcer Post Debridement: Improved Severity of Tissue Post Debridement: Fat layer exposed Post Procedure Diagnosis Same as Pre-procedure Electronic Signature(s) Signed: 04/20/2022 4:16:30 PM By: Shawn Stall RN, BSN Signed: 04/20/2022 5:51:12 PM By: Allen Derry PA-C Entered By: Shawn Stall on 04/20/2022 15:14:59 Cordts, Khup D ([id number]) [id number] Page 2 of 10 -------------------------------------------------------------------------------- HPI Details Patient Name: Date of Service: [name] 04/20/2022 3:00 PM Medical Record Number: [id number] Patient Account Number: [id number] Date of Birth/Sex: Treating RN: 24-Nov-1955 (67 y.o. M) Primary Care Provider: [name] Other Clinician: Referring Provider: Treating Provider/Extender: Alver Fisher in Treatment: 37 History of Present Illness HPI Description: ADMISSION 03/22/2021 This is a 67 year old man with a past medical history significant for diabetes type 2, congestive heart failure, peripheral arterial disease, morbid obesity, venous insufficiency, and coronary artery disease. He has been followed by Dr. Loreta Ave in podiatry, who performed a transmetatarsal amputation on the left foot in August 2022. He had issues healing that wound, but based upon Dr. Kenna Gilbert notes, ultimately the TMA wound healed. During his recovery from that surgery, however, ulcers opened up over the DIP joint of the right second and third toe. These have apparently closed and reopened multiple times. It sounds like one of the issues has been moisture accumulation and maceration of the tissues  causing them to reopen. At his last visit with Dr. Loreta Ave, on March 01, 2021, there continues to be problems with moisture and he was referred to wound care for further evaluation and management. He had a formal aortogram with runoff performed prior to his TMA. The findings are copied here: Patient has inline flow to both feet with no significant flow-limiting lesion that would be amenable to percutaneous or open revascularization. He does have  an element of small vessel disease and has a short segment occlusion of the distal anterior tibial/dorsalis pedis artery on the left foot but does have posterior tibial artery flow. Would recommend management of wounds with amputation of toes 2 and 3 on the right foot if the wounds do not heal and deteriorate. Transmetatarsal amputation on the left side has as good a blood supply as it is going to get and hopefully this will heal in the future. Formal ABIs were done in January 2023. They are normal bilaterally. ABI Findings: +---------+------------------+-----+----------+--------+ Right Rt Pressure (mmHg)IndexWaveform Comment  +---------+------------------+-----+----------+--------+ Brachial 160     +---------+------------------+-----+----------+--------+ PTA 192 1.06 monophasic  +---------+------------------+-----+----------+--------+ DP 159 0.88 monophasic  +---------+------------------+-----+----------+--------+ Great T oe145 0.80    +---------+------------------+-----+----------+--------+ +--------+------------------+-----+---------+-------+ Left Lt Pressure (mmHg)IndexWaveform Comment +--------+------------------+-----+---------+-------+ [id number]     +--------+------------------+-----+---------+-------+ PTA 204 1.13 triphasic  +--------+------------------+-----+---------+-------+ DP 194 1.07 biphasic    +--------+------------------+-----+---------+-------+ +-------+-----------+-----------+------------+------------+ ABI/TBIT oday's ABIT oday's TBIPrevious ABIPrevious TBI +-------+-----------+-----------+------------+------------+ Right 1.06 0.8 1.26 0.65  +-------+-----------+-----------+------------+------------+ Left 1.13 amputation 1.15 amputation  +-------+-----------+-----------+------------+------------+ Previous ABI on 08/06/20 at [facility] Pedal pressures falsely elevated due to medial calcification. Summary: Right: Resting right ankle-brachial index is within normal range. The right toe-brachial index is normal. Left: Resting left ankle-brachial index is within normal range. READMISSION 08/04/2021 Mr. Clute is now a 67 year old man who I remember from this clinic many years ago I think he had a right lower extremity predominantly venous wound at the time. He was here for 1 visit in March of this year had wounds on his right second and third toes we apparently dressed them many and they healed so he did not come back. He is listed in [place] is being a diabetic although the patient denies this says he is verified it with his primary doctor. In any case over the last several weeks or so according the patient although these wounds look somewhat more chronic than that he has developed predominantly large wounds on the right medial and right lateral ankle smaller areas on the left leg and areas on the dorsal aspect of his right second and third toes. Its not clear how he has been dressing these. 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 The patient has known PAD. He had an angiogram in August 2022 at which time he had nonhealing wounds in both feet. On the left his major vessels in the JACINTO, KEIL D ([id number]) [id number] Page 3 of 10 thigh were all patent. He  had three-vessel patent to the level of the ankle. He had a very short occlusion in the left anterior tibial. On the right lower extremity the major vessels in his thigh were all patent. He had three-vessel runoff to the foot sluggish filling of the anterior tibial artery. He was felt to have some component of small vessel disease but nothing that was amenable or needed revascularization. It was recommended that he have amputation of the second and third toes on the right foot if they did not heal He has been following with Dr. Loreta Ave of podiatry. Dr. Loreta Ave got him juxta lite stockings although I do not think he had them on properly he has uncontrolled edema in both legs Past medical history includes type 2 diabetes [although the patient really denies this], left TMA in 2022,lower extremity wounds in fact attendance at this clinic in 2009-2010, A-fib on Eliquis, chronic venous insufficiency with secondary lymphedema history of non-Hodgkin's lymphoma.  08-11-2021 upon evaluation today patient presents for follow-up evaluation he was seen last Wednesday initially for inspection here in our clinic. With that being said he tells me that he unfortunately has been having a lot of drainage and is actually coming through his wrap. Fortunately I do not see any evidence of active infection locally or systemically at this time which is great news. No fevers, chills, nausea, vomiting, or diarrhea. With that being said there does appear to be some evidence of local infection based on what I am seeing today. 08-18-2021 upon evaluation today patient appears to be doing okay currently in regard to his wounds with that being said that he is doing much better but still has a long ways to go to get to where he wanted to be. I think the infection is significantly improved. He has another week of the antibiotic at this point. 08-25-2021 upon evaluation today patient's wounds are actually doing decently well he has erythema has  significantly improved. I think the cellulitis is under controlled I am going to continue him on 2 more weeks of the Levaquin at 500 mg this is a lower dose but I am hoping it will be better for him. 09-08-2021 upon evaluation today patient appears to be doing excellent in regard to his wounds. Since I last saw him he was actually in the hospital where he ended up having a pacemaker put in. Subsequently he tells me that he is actually doing quite well although they were unsure whether they were going to do it due to the fact that he had the wounds on his legs. And then I am glad they did anything seem to be doing well. 09-15-2021 upon evaluation patient's wounds are actually showing signs of improvement. The right side wounds do appear to have some need for sharp debridement today and I Ernie Hew go ahead and proceed with that. I think that if we get the wounds cleaned up he will actually show signs of continued improvement. I am also leaning towards switching to [employer] which I think will be a much better option for him. 09-22-2021 patient's wounds are showing signs of excellent improvement. I am actually extremely pleased with where we stand and I think that the patient is making great progress. There does not appear to be any signs of active infection. 09-29-2021 upon evaluation today patient appears to be doing excellent in regard to his wounds. He is actually tolerating the dressing changes without complication. Fortunately I see no evidence of active infection locally or systemically at this time which is great news and overall I am extremely pleased with where we stand currently. [id number] upon evaluation today patient actually appears to be doing excellent in regard to his wounds. The left leg is almost completely healed the right ankle is significantly smaller. Overall I am extremely pleased with where we stand at this point. I do not see any evidence of active infection at this time. 10-13-2021  upon evaluation today patient appears to be doing excellent in regard to his wounds. I really feel like he is making good progress here and I am very pleased in that regard. Fortunately I do not see any signs of active infection at this time. We are using the [facility] topical antibiotic therapy. 10-20-2021 upon evaluation today patient appears to be doing well with regard to his wound on the right medial ankle region the left leg is almost completely healed. I am actually very pleased with where we stand today.  11-03-2021 upon evaluation today patient's wound actually is going require some sharp debridement but appears to be doing much better which is great news. Fortunately I do not see any signs of active infection at this time. 11-10-2021 upon evaluation today patient's wound is actually showing signs of excellent improvement. Fortunately I do not see any evidence of infection locally or systemically which is great news and overall I am extremely pleased with where we stand today. I do believe he is making good progress he does have his [facility] topical antibiotics with him here today. 11-24-2021 upon evaluation today patient actually showing signs of excellent improvement this appears to be doing much better. Fortunately I do not see any evidence of infection locally or systemically at this time. 12-01-2021 upon evaluation today patient appears to be doing well currently in regard to his wound. He has been tolerating the dressing changes without complication. Fortunately I do not see any evidence of active infection at this time which is great news and overall I am extremely pleased with where we stand today. 12-15-2021 upon evaluation today patient appears to be doing well with regard to his wound. He is showing signs of improvement is slow but nonetheless we are making improvements here. Fortunately I do not see any evidence of active infection locally nor systemically at this time. We are still  using the [facility] topical antibiotic over the open area only. 12-29-2021 upon evaluation today patient's wound actually is showing signs of excellent improvement. It has been 2 weeks since I perform any debridement and it definitely shows he has some tissue that needs to be cleaned away but I think we can do so quite easily and readily today. The good news is I do not see any signs of infection I think he is doing much better in that regard. Overall I am extremely pleased with where we stand. 01-12-2022 upon evaluation today patient appears to be doing well currently in regard to his wound although it is not getting significantly smaller it is also not getting any larger. Fortunately I do not see any evidence of infection locally nor systemically which is great news and overall I am extremely pleased with where things stand currently. 01-19-2022 upon evaluation today patient appears to be doing well currently in regard to his wound. The PolyMem actually seems to have done extremely well for him. Fortunately I do not see any signs of infection locally nor systemically at this time. 01-26-2022 upon evaluation today patient appears to be making progress. Fortunately there does not appear to be any signs of infection which is great news. No fevers, chills, nausea, vomiting, or diarrhea. With that being said this is very slow to heal and although it is smaller I still feel like we may want to try to do something to speed this up I think that a skin substitute could be beneficial, look into Kerecis. 02-02-2022 upon evaluation today patient's wound is actually showing signs of excellent improvement. I do not see any evidence of infection and overall I think that we are headed in the right direction. Fortunately I think that he is tolerating the dressing changes without complication. 02-09-2022 upon evaluation today patient appears to be doing well currently in regard to his wound. It does look a little bit  macerated but fortunately does not appear to be showing any signs of significant skin breakdown over the macerated area which is good news. Fortunately I do not see any evidence of active infection locally nor systemically  at this point which is great news. 02-23-2022 upon evaluation today patient appears to be doing well currently in regard to his wounds. In fact his area on the ankle is actually showing signs of healing quite nicely. We do have the Apligraf ready today I am hopeful this is going to speed things up and get this closed much more effectively and quickly. Fortunately I do not see any signs of active infection locally nor systemically at this time which is great news. 03-02-2022 upon evaluation today patient appears to be doing excellent in regard to his wound. He is actually been tolerating the dressing changes without complication the wound on the left medial lower extremity is doing quite well and Apligraf seems to have been extremely beneficial for him. I am extremely ERNST, CUMPSTON ([id number]) [id number] Page 4 of 10 pleased with where we stand today. 03/16/2022: The wound measurements are smaller today. He has accumulation of a yellow crust around the edges and slough on the surface. There is a musty odor coming from the wound. He has been getting Apligraf. 03-30-2022 upon evaluation today patient appears to be making progress. In regard to his leg ulcer. This is slow but nonetheless the Apligraf has fed things up. Fortunately I do not see any evidence of infection locally nor systemically at this time. No fevers, chills, nausea, vomiting, or diarrhea. Patient is here for Apligraf #4 today. 04-13-2022 upon evaluation today patient appears to be doing a little bit more poorly in regard to his wound. He actually feels like his wrap may have been a little bit tight. Fortunately there does not appear to be any signs of active infection locally nor systemically  which is great news and I am pleased in that regard. 04-20-2022 upon evaluation today patient appears to be doing better in regard to his wound from the standpoint of swelling he is actually lost 33 pounds on torsemide in the past week his leg is significantly smaller compared to what it has been. Fortunately I do not see any signs of active infection locally nor systemically at this time. Electronic Signature(s) Signed: 04/20/2022 4:38:16 PM By: Allen Derry PA-C Entered By: Allen Derry on 04/20/2022 16:38:16 -------------------------------------------------------------------------------- Physical Exam Details Patient Name: Date of Service: Gosse, [name] 04/20/2022 3:00 PM Medical Record Number: [id number] Patient Account Number: [id number] Date of Birth/Sex: Treating RN: 07/02/1955 (67 y.o. M) Primary Care Provider: [name] Other Clinician: Referring Provider: Treating Provider/Extender: Alver Fisher in Treatment: 37 Constitutional Obese and well-hydrated in no acute distress. Respiratory normal breathing without difficulty. Psychiatric this patient is able to make decisions and demonstrates good insight into disease process. Alert and Oriented x 3. pleasant and cooperative. Notes Upon inspection patient's wound bed actually showed signs of good granulation epithelization at this point. Fortunately I do not see any evidence of active infection which is good news and in general I do believe that we are moving in the right direction I think that getting on the torsemide and getting a lot of this fluid out is can make a big difference as well. The patient is in agreement with this plan. Electronic Signature(s) Signed: 04/20/2022 4:38:45 PM By: Allen Derry PA-C Entered By: Allen Derry on 04/20/2022 16:38:45 -------------------------------------------------------------------------------- Physician Orders Details Patient Name: Date of Service: [name], [name]  04/20/2022 3:00 PM Medical Record Number: [id number] Patient Account Number: [id number] Date of Birth/Sex: Treating RN: 05-20-1955 (67 y.o. [name] Primary Care Provider: [name] Other Clinician:  Referring Provider: Treating Provider/Extender: Alver Fisher in Treatment: [phone] Verbal / Phone Orders: No Diagnosis Coding ICD-10 Coding Code Description I89.0 Lymphedema, not elsewhere classified I87.333 Chronic venous hypertension (idiopathic) with ulcer and inflammation of bilateral lower extremity L97.828 Non-pressure chronic ulcer of other part of left lower leg with other specified severity L97.818 Non-pressure chronic ulcer of other part of right lower leg with other specified severity E11.621 Type 2 diabetes mellitus with foot ulcer L97.518 Non-pressure chronic ulcer of other part of right foot with other specified severity DARTAGNAN, BEAVERS D ([id number]) [id number] Page 5 of 10 Follow-up Appointments ppointment in 1 week. Leonard Schwartz Wednesday 04/27/2022 3pm room 9 Return A ppointment in 2 weeks. Leonard Schwartz Wednesday 05/04/2022 3pm room 9 Return A Anesthetic (In clinic) Topical Lidocaine 5% applied to wound bed Cellular or Tissue Based Products Cellular or Tissue Based Product Type: - Run IVR for Kerrecis- denied #1 Apligraf applied 02/23/2022 #2 Apligraf applied 03/02/2022 #3 Apligraf applied 03/16/2022 #4 Apligraf applied 03/30/2022 HOLD APLIGRAF this week. Cellular or Tissue Based Product applied to wound bed, secured with steri-strips, cover with Adaptic or Mepitel. (DO NOT REMOVE). Bathing/ Shower/ Hygiene May shower with protection but do not get wound dressing(s) wet. Protect dressing(s) with water repellant cover (for example, large plastic bag) or a cast cover and may then take shower. Edema Control - Lymphedema / SCD / Other Elevate legs to the level of the heart or above for 30 minutes daily and/or when sitting for 3-4 times a  day throughout the day. Avoid standing for long periods of time. Exercise regularly Moisturize legs daily. - apply every night before bed to left leg. Compression stocking or Garment 30-40 mm/Hg pressure to: - wear your VIVE compression stocking to left leg. apply in the morning and remove at night. Will apply tubigrip size D to left leg. Remove once you get home and use your compression stockings. Wound Treatment Wound #7 - Malleolus Wound Laterality: Right, Medial Peri-Wound Care: Triamcinolone 15 (g) 1 x Per Week/30 Days Discharge Instructions: Use triamcinolone 15 (g) as directed Peri-Wound Care: Zinc Oxide Ointment 30g tube 1 x Per Week/30 Days Discharge Instructions: Apply Zinc Oxide to periwound with each dressing change Peri-Wound Care: Sween Lotion (Moisturizing lotion) 1 x Per Week/30 Days Discharge Instructions: Apply moisturizing lotion as directed Prim Dressing: Maxorb Extra Ag+ Alginate Dressing, 2x2 (in/in) 1 x Per Week/30 Days ary Discharge Instructions: Apply to wound bed as instructed Secondary Dressing: Woven Gauze Sponge, Non-Sterile 4x4 in 1 x Per Week/30 Days Discharge Instructions: Apply over primary dressing as directed. Secondary Dressing: Zetuvit Plus 4x8 in 1 x Per Week/30 Days Discharge Instructions: Apply over primary dressing as directed. Compression Wrap: ThreePress (3 layer compression wrap) 1 x Per Week/30 Days Discharge Instructions: Apply three layer use kerlix instead of cotton compression as directed. Electronic Signature(s) Signed: 04/20/2022 4:16:30 PM By: Shawn Stall RN, BSN Signed: 04/20/2022 5:51:12 PM By: Allen Derry PA-C Entered By: Shawn Stall on 04/20/2022 15:16:05 -------------------------------------------------------------------------------- Problem List Details Patient Name: Date of Service: [place]ka, [name] 04/20/2022 3:00 PM Medical Record Number: [id number] Patient Account Number: [id number] Date of Birth/Sex: Treating RN: January 19, 1955  (67 y.o. [name] Primary Care Provider: [name] Other Clinician: Referring Provider: Treating Provider/Extender: Alver Fisher in Treatment: 37 Active Problems ICD-10 Encounter Code Description Active Date MDM Diagnosis I89.0 Lymphedema, not elsewhere classified 08/04/2021 No Yes JANSEN, SCIUTO D ([id number]) [id number] Page 6 of 10 [phone] Chronic venous hypertension (  idiopathic) with ulcer and inflammation of 08/04/2021 No Yes bilateral lower extremity L97.828 Non-pressure chronic ulcer of other part of left lower leg with other specified 08/04/2021 No Yes severity L97.818 Non-pressure chronic ulcer of other part of right lower leg with other specified 08/04/2021 No Yes severity E11.621 Type 2 diabetes mellitus with foot ulcer 08/04/2021 No Yes L97.518 Non-pressure chronic ulcer of other part of right foot with other specified 08/04/2021 No Yes severity Inactive Problems Resolved Problems Electronic Signature(s) Signed: 04/20/2022 3:06:56 PM By: Allen DerryStone, Amir Fick PA-C Entered By: Allen DerryStone, Deara Bober on 04/20/2022 15:06:56 -------------------------------------------------------------------------------- Progress Note Details Patient Name: Date of Service: [place], [name] 04/20/2022 3:00 PM Medical Record Number: [id number] Patient Account Number: [id number] Date of Birth/Sex: Treating RN: [birth year] (67 y.o. M) Primary Care Provider: Martha ClanShaw, William Other Clinician: Referring Provider: Treating Provider/Extender: Alver FisherStone III, Averie Hornbaker [name], William Weeks in Treatment: 37 Subjective Chief Complaint Information obtained from Patient 08/04/2021; patient returns to clinic with bilateral leg wounds as well as areas on the right foot History of Present Illness (HPI) ADMISSION 03/22/2021 This is a [age] year old man with a past medical history significant for diabetes type 2, congestive heart failure, peripheral arterial disease, morbid obesity, venous  insufficiency, and coronary artery disease. He has been followed by Dr. Loreta AveWagner in podiatry, who performed a transmetatarsal amputation on the left foot in August 2022. He had issues healing that wound, but based upon Dr. Kenna GilbertWagner's notes, ultimately the TMA wound healed. During his recovery from that surgery, however, ulcers opened up over the DIP joint of the right second and third toe. These have apparently closed and reopened multiple times. It sounds like one of the issues has been moisture accumulation and maceration of the tissues causing them to reopen. At his last visit with Dr. Loreta AveWagner, on March 01, 2021, there continues to be problems with moisture and he was referred to wound care for further evaluation and management. He had a formal aortogram with runoff performed prior to his TMA. The findings are copied here: Patient has inline flow to both feet with no significant flow-limiting lesion that would be amenable to percutaneous or open revascularization. He does have an element of small vessel disease and has a short segment occlusion of the distal anterior tibial/dorsalis pedis artery on the left foot but does have posterior tibial artery flow. Would recommend management of wounds with amputation of toes 2 and 3 on the right foot if the wounds do not heal and deteriorate. Transmetatarsal amputation on the left side has as good a blood supply as it is going to get and hopefully this will heal in the future. Formal ABIs were done in January 2023. They are normal bilaterally. ABI Findings: +---------+------------------+-----+----------+--------+ Right Rt Pressure (mmHg)IndexWaveform Comment  +---------+------------------+-----+----------+--------+ Brachial 160    +---------+------------------+-----+----------+--------+ PTA 192 1.06 monophasic  +---------+------------------+-----+----------+--------+ DP 159 0.88 monophasic  Mcdowell, Tamaj D ([id number])  [id number] Page 7 of 10 +---------+------------------+-----+----------+--------+ Great T oe145 0.80    +---------+------------------+-----+----------+--------+ +--------+------------------+-----+---------+-------+ Left Lt Pressure (mmHg)IndexWaveform Comment +--------+------------------+-----+---------+-------+ [id number]    +--------+------------------+-----+---------+-------+ PTA 204 1.13 triphasic  +--------+------------------+-----+---------+-------+ DP 194 1.07 biphasic   +--------+------------------+-----+---------+-------+ +-------+-----------+-----------+------------+------------+ ABI/TBIT oday's ABIT oday's TBIPrevious ABIPrevious TBI +-------+-----------+-----------+------------+------------+ Right 1.06 0.8 1.26 0.65  +-------+-----------+-----------+------------+------------+ Left 1.13 amputation 1.15 amputation  +-------+-----------+-----------+------------+------------+ [facility] Pedal pressures falsely elevated due to medial calcification. Summary: Right: Resting right ankle-brachial index is within normal range. The right toe-brachial index is normal. Left: Resting left ankle-brachial index is within normal range. READMISSION 08/04/2021 Mr. Alto DenverHunt is now  a 67 year old man who I remember from this clinic many years ago I think he had a right lower extremity predominantly venous wound at the time. He was here for 1 visit in March of this year had wounds on his right second and third toes we apparently dressed them many and they healed so he did not come back. He is listed in [place] is being a diabetic although the patient denies this says he is verified it with his primary doctor. In any case over the last several weeks or so according the patient although these wounds look somewhat more chronic than that he has developed predominantly large wounds on the right  medial and right lateral ankle smaller areas on the left leg and areas on the dorsal aspect of his right second and third toes. Its not clear how he has been dressing these. 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 The patient has known PAD. He had an angiogram in August 2022 at which time he had nonhealing wounds in both feet. On the left his major vessels in the thigh were all patent. He had three-vessel patent to the level of the ankle. He had a very short occlusion in the left anterior tibial. On the right lower extremity the major vessels in his thigh were all patent. He had three-vessel runoff to the foot sluggish filling of the anterior tibial artery. He was felt to have some component of small vessel disease but nothing that was amenable or needed revascularization. It was recommended that he have amputation of the second and third toes on the right foot if they did not heal He has been following with Dr. Loreta Ave of podiatry. Dr. Loreta Ave got him juxta lite stockings although I do not think he had them on properly he has uncontrolled edema in both legs Past medical history includes type 2 diabetes [although the patient really denies this], left TMA in 2022,lower extremity wounds in fact attendance at this clinic in 2009-2010, A-fib on Eliquis, chronic venous insufficiency with secondary lymphedema history of non-Hodgkin's lymphoma. 08-11-2021 upon evaluation today patient presents for follow-up evaluation he was seen last Wednesday initially for inspection here in our clinic. With that being said he tells me that he unfortunately has been having a lot of drainage and is actually coming through his wrap. Fortunately I do not see any evidence of active infection locally or systemically at this time which is great news. No fevers, chills, nausea, vomiting, or diarrhea. With that being said there does appear to be some  evidence of local infection based on what I am seeing today. 08-18-2021 upon evaluation today patient appears to be doing okay currently in regard to his wounds with that being said that he is doing much better but still has a long ways to go to get to where he wanted to be. I think the infection is significantly improved. He has another week of the antibiotic at this point. 08-25-2021 upon evaluation today patient's wounds are actually doing decently well he has erythema has significantly improved. I think the cellulitis is under controlled I am going to continue him on 2 more weeks of the Levaquin at 500 mg this is a lower dose but I am hoping it will be better for him. 09-08-2021 upon evaluation today patient appears to be doing excellent in regard to his wounds. Since I last saw him he was actually in the  hospital where he ended up having a pacemaker put in. Subsequently he tells me that he is actually doing quite well although they were unsure whether they were going to do it due to the fact that he had the wounds on his legs. And then I am glad they did anything seem to be doing well. 09-15-2021 upon evaluation patient's wounds are actually showing signs of improvement. The right side wounds do appear to have some need for sharp debridement today and I Ernie Hew go ahead and proceed with that. I think that if we get the wounds cleaned up he will actually show signs of continued improvement. I am also leaning towards switching to [facility] which I think will be a much better option for him. 09-22-2021 patient's wounds are showing signs of excellent improvement. I am actually extremely pleased with where we stand and I think that the patient is making great progress. There does not appear to be any signs of active infection. 09-29-2021 upon evaluation today patient appears to be doing excellent in regard to his wounds. He is actually tolerating the dressing changes without complication. Fortunately I see  no evidence of active infection locally or systemically at this time which is great news and overall I am extremely pleased with where we stand currently. [id number] upon evaluation today patient actually appears to be doing excellent in regard to his wounds. The left leg is almost completely healed the right ankle is significantly smaller. Overall I am extremely pleased with where we stand at this point. I do not see any evidence of active infection at this time. 10-13-2021 upon evaluation today patient appears to be doing excellent in regard to his wounds. I really feel like he is making good progress here and I am very pleased in that regard. Fortunately I do not see any signs of active infection at this time. We are using the [facility] topical antibiotic therapy. 10-20-2021 upon evaluation today patient appears to be doing well with regard to his wound on the right medial ankle region the left leg is almost completely healed. I am actually very pleased with where we stand today. CHRISTOS, LAPRISE ([id number]) [id number] Page 8 of 10 11-03-2021 upon evaluation today patient's wound actually is going require some sharp debridement but appears to be doing much better which is great news. Fortunately I do not see any signs of active infection at this time. 11-10-2021 upon evaluation today patient's wound is actually showing signs of excellent improvement. Fortunately I do not see any evidence of infection locally or systemically which is great news and overall I am extremely pleased with where we stand today. I do believe he is making good progress he does have his [facility] topical antibiotics with him here today. 11-24-2021 upon evaluation today patient actually showing signs of excellent improvement this appears to be doing much better. Fortunately I do not see any evidence of infection locally or systemically at this time. 12-01-2021 upon evaluation today patient appears to be  doing well currently in regard to his wound. He has been tolerating the dressing changes without complication. Fortunately I do not see any evidence of active infection at this time which is great news and overall I am extremely pleased with where we stand today. 12-15-2021 upon evaluation today patient appears to be doing well with regard to his wound. He is showing signs of improvement is slow but nonetheless we are making improvements here. Fortunately I do not see any evidence of active infection locally  nor systemically at this time. We are still using the [facility] topical antibiotic over the open area only. 12-29-2021 upon evaluation today patient's wound actually is showing signs of excellent improvement. It has been 2 weeks since I perform any debridement and it definitely shows he has some tissue that needs to be cleaned away but I think we can do so quite easily and readily today. The good news is I do not see any signs of infection I think he is doing much better in that regard. Overall I am extremely pleased with where we stand. 01-12-2022 upon evaluation today patient appears to be doing well currently in regard to his wound although it is not getting significantly smaller it is also not getting any larger. Fortunately I do not see any evidence of infection locally nor systemically which is great news and overall I am extremely pleased with where things stand currently. 01-19-2022 upon evaluation today patient appears to be doing well currently in regard to his wound. The PolyMem actually seems to have done extremely well for him. Fortunately I do not see any signs of infection locally nor systemically at this time. 01-26-2022 upon evaluation today patient appears to be making progress. Fortunately there does not appear to be any signs of infection which is great news. No fevers, chills, nausea, vomiting, or diarrhea. With that being said this is very slow to heal and although it is smaller I  still feel like we may want to try to do something to speed this up I think that a skin substitute could be beneficial, look into Kerecis. 02-02-2022 upon evaluation today patient's wound is actually showing signs of excellent improvement. I do not see any evidence of infection and overall I think that we are headed in the right direction. Fortunately I think that he is tolerating the dressing changes without complication. 02-09-2022 upon evaluation today patient appears to be doing well currently in regard to his wound. It does look a little bit macerated but fortunately does not appear to be showing any signs of significant skin breakdown over the macerated area which is good news. Fortunately I do not see any evidence of active infection locally nor systemically at this point which is great news. 02-23-2022 upon evaluation today patient appears to be doing well currently in regard to his wounds. In fact his area on the ankle is actually showing signs of healing quite nicely. We do have the Apligraf ready today I am hopeful this is going to speed things up and get this closed much more effectively and quickly. Fortunately I do not see any signs of active infection locally nor systemically at this time which is great news. 03-02-2022 upon evaluation today patient appears to be doing excellent in regard to his wound. He is actually been tolerating the dressing changes without complication the wound on the left medial lower extremity is doing quite well and Apligraf seems to have been extremely beneficial for him. I am extremely pleased with where we stand today. 03/16/2022: The wound measurements are smaller today. He has accumulation of a yellow crust around the edges and slough on the surface. There is a musty odor coming from the wound. He has been getting Apligraf. 03-30-2022 upon evaluation today patient appears to be making progress. In regard to his leg ulcer. This is slow but nonetheless the Apligraf  has fed things up. Fortunately I do not see any evidence of infection locally nor systemically at this time. No fevers, chills, nausea, vomiting,  or diarrhea. Patient is here for Apligraf #4 today. 04-13-2022 upon evaluation today patient appears to be doing a little bit more poorly in regard to his wound. He actually feels like his wrap may have been a little bit tight. Fortunately there does not appear to be any signs of active infection locally nor systemically which is great news and I am pleased in that regard. 04-20-2022 upon evaluation today patient appears to be doing better in regard to his wound from the standpoint of swelling he is actually lost 33 pounds on torsemide in the past week his leg is significantly smaller compared to what it has been. Fortunately I do not see any signs of active infection locally nor systemically at this time. Objective Constitutional Obese and well-hydrated in no acute distress. Vitals Time Taken: 2:58 PM, Height: 71 in, Weight: 350 lbs, BMI: 48.8, Temperature: 98.3 F, Pulse: 94 bpm, Respiratory Rate: 18 breaths/min, Blood Pressure: 119/83 mmHg. Respiratory normal breathing without difficulty. Psychiatric this patient is able to make decisions and demonstrates good insight into disease process. Alert and Oriented x 3. pleasant and cooperative. General Notes: Upon inspection patient's wound bed actually showed signs of good granulation epithelization at this point. Fortunately I do not see any evidence of active infection which is good news and in general I do believe that we are moving in the right direction I think that getting on the torsemide and getting a lot of this fluid out is can make a big difference as well. The patient is in agreement with this plan. JENTZEN, MINASYAN ([id number]) [id number] Page 9 of 10 Integumentary (Hair, Skin) Wound #7 status is Open. Original cause of wound was Blister. The date acquired was:  07/21/2021. The wound has been in treatment 37 weeks. The wound is located on the Right,Medial Malleolus. The wound measures 4.4cm length x 2.9cm width x 0.1cm depth; 10.022cm^2 area and 1.002cm^3 volume. There is a medium amount of serosanguineous drainage noted. 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 There was a Excisional Skin/Subcutaneous Tissue Debridement with a total area of 13.5 sq cm performed by Lenda Kelp, PA. With the following instrument(s): Curette to remove Viable and Non-Viable tissue/material. Material removed includes Callus, Subcutaneous Tissue, Slough, Skin: Dermis, and Skin: Epidermis after achieving pain control using Lidocaine 5% topical ointment. A time out was conducted at 15:05, prior to the start of the procedure. A Minimum amount of bleeding was controlled with Pressure. The procedure was tolerated well with a pain level of 0 throughout and a pain level of 0 following the procedure. Post Debridement Measurements: 4.4cm length x 2.9cm width x 0.2cm depth; 2.004cm^3 volume. Character of Wound/Ulcer Post Debridement is improved. Severity of Tissue Post Debridement is: Fat layer exposed. Post procedure Diagnosis Wound #7: Same as Pre-Procedure Pre-procedure diagnosis of Wound #7 is a Venous Leg Ulcer located on the Right,Medial Malleolus . There was a Three Layer  Compression Therapy Procedure by Shawn Stall, RN. Post procedure Diagnosis Wound #7: Same  as Pre-Procedure Plan Follow-up Appointments: Return Appointment in 1 week. Leonard Schwartz Wednesday 04/27/2022 3pm room 9 Return Appointment in 2 weeks. Leonard Schwartz Wednesday 05/04/2022 3pm room 9 Anesthetic: (In clinic) Topical Lidocaine 5% applied to wound bed Cellular or Tissue Based Products: Cellular or Tissue Based Product Type: - Run IVR for Kerrecis- denied #1 Apligraf applied 02/23/2022 #2 Apligraf applied 03/02/2022 #3 Apligraf applied 03/16/2022 #4 Apligraf applied 03/30/2022 HOLD APLIGRAF this week. Cellular or Tissue Based Product applied to wound bed, secured with steri-strips, cover with Adaptic or Mepitel. (DO NOT REMOVE). Bathing/ Shower/ Hygiene: May shower with protection but do not get wound dressing(s) wet. Protect dressing(s) with water repellant cover (for example, large plastic bag) or a cast cover and may then take shower. Edema Control - Lymphedema / SCD / Other: Elevate legs to the level of the heart or above for 30 minutes daily and/or when sitting for 3-4 times a day throughout the day. Avoid standing for long periods of time. Exercise regularly Moisturize legs daily. - apply every night before bed to left leg. Compression stocking or Garment 30-40 mm/Hg pressure to: - wear your VIVE compression stocking to left leg. apply in the morning and remove at night. Will apply tubigrip size D to left leg. Remove once you get home and use your compression stockings. WOUND #7: - Malleolus Wound Laterality: Right, Medial Peri-Wound Care: Triamcinolone 15 (g) 1 x Per Week/30 Days Discharge Instructions: Use triamcinolone 15 (g) as directed Peri-Wound Care: Zinc Oxide Ointment 30g tube 1 x Per Week/30 Days Discharge Instructions: Apply Zinc Oxide to periwound with each dressing change Peri-Wound Care: Sween Lotion (Moisturizing lotion) 1 x Per Week/30 Days Discharge Instructions: Apply moisturizing  lotion as directed Prim Dressing: Maxorb Extra Ag+ Alginate Dressing, 2x2 (in/in) 1 x Per Week/30 Days ary Discharge Instructions: Apply to wound bed as instructed Secondary Dressing: Woven Gauze Sponge, Non-Sterile 4x4 in 1 x Per Week/30 Days Discharge Instructions: Apply over primary dressing as directed. Secondary Dressing: Zetuvit Plus 4x8 in 1 x Per Week/30 Days Discharge Instructions: Apply over primary dressing as directed. Com pression Wrap: ThreePress (3 layer compression wrap) 1 x Per Week/30 Days Discharge Instructions: Apply three layer use kerlix instead of cotton compression as directed. 1. For the time being I would recommend that we stay off of the Apligraf or not to use this currently and I think that still probably the best way to go. AVANT, PRINTY ([id number]) [id number] Page 10 of 10 2. I am going to recommend that we have the patient continue to monitor for any evidence of infection or worsening. Obviously based on what I am seeing I do believe that we are making progress here as she has very slow progress. 3. I would also suggest that he continue with regular compression wrap which I think is doing a good job here. We will see patient back for reevaluation in 1 week here in the clinic. If anything worsens or changes patient will contact our office for additional recommendations. Electronic Signature(s) Signed: 04/20/2022 4:39:12 PM By: Allen Derry PA-C Entered By: Allen Derry on 04/20/2022 16:39:12 -------------------------------------------------------------------------------- SuperBill Details Patient Name: Date of Service: [place], [name] 04/20/2022 Medical Record Number: [id number] Patient Account Number: [id number] Date of Birth/Sex: Treating RN: 05-03-1955 (67 y.o. [name] Primary Care Provider: [name] Other Clinician: Referring Provider: Treating Provider/Extender: Alver Fisher in Treatment:  37 Diagnosis Coding ICD-10 Codes Code Description I89.0 Lymphedema, not elsewhere classified I87.333 Chronic venous  hypertension (idiopathic) with ulcer and inflammation of bilateral lower extremity L97.828 Non-pressure chronic ulcer of other part of left lower leg with other specified severity L97.818 Non-pressure chronic ulcer of other part of right lower leg with other specified severity E11.621 Type 2 diabetes mellitus with foot ulcer L97.518 Non-pressure chronic ulcer of other part of right foot with other specified severity Facility Procedures : 3 CPT4 Code: 1610960 Description: 11042 - DEB SUBQ TISSUE 20 SQ CM/< ICD-10 Diagnosis Description L97.828 Non-pressure chronic ulcer of other part of left lower leg with other specified Modifier: severity Quantity: 1 Physician Procedures : CPT4 Code Description Modifier [id number] 11042 - WC PHYS SUBQ TISS 20 SQ CM ICD-10 Diagnosis Description L97.828 Non-pressure chronic ulcer of other part of left lower leg with other specified severity Quantity: 1 Electronic Signature(s) Signed: 04/20/2022 4:39:33 PM By: Allen Derry PA-C Previous Signature: 04/20/2022 4:16:30 PM Version By: Shawn Stall RN, BSN Entered By: Allen Derry on 04/20/2022 16:39:33

## 2022-04-20 NOTE — Progress Notes (Signed)
NINO, STRUBE ([id number]) [id number] Page 1 of 5 Visit Report for 04/20/2022 Arrival Information Details Patient Name: Date of Service: [name], [name] 04/20/2022 3:00 PM Medical Record Number: [id number] Patient Account Number: [id number] Date of Birth/Sex: Treating RN: Oct 26, 1955 (67 y.o. M) Primary Care Venecia Mehl: [name] Other Clinician: Referring Jannae Fagerstrom: Treating Creta Dorame/Extender: Alver Fisher in Treatment: 37 Visit Information History Since Last Visit Added or deleted any medications: No Patient Arrived: [name] Any new allergies or adverse reactions: No Arrival Time: 14:59 Had a fall or experienced change in No Accompanied By: self activities of daily living that may affect Transfer Assistance: None risk of falls: Patient Identification Verified: Yes Signs or symptoms of abuse/neglect since last visito No Secondary Verification Process Completed: Yes Hospitalized since last visit: No Patient Requires Transmission-Based Precautions: No Implantable device outside of the clinic excluding No Patient Has Alerts: Yes cellular tissue based products placed in the center Patient Alerts: Patient on Blood Thinner since last visit: 01/2021 ABI L 1.13 R 1.06 Has Dressing in Place as Prescribed: Yes 01/2021 TBI L amp R 0.8 Pain Present Now: No Electronic Signature(s) Signed: 04/20/2022 3:56:59 PM By: Karl Ito Entered By: Karl Ito on 04/20/2022 14:59:57 -------------------------------------------------------------------------------- Compression Therapy Details Patient Name: Date of Service: Mcomber, [name] 04/20/2022 3:00 PM Medical Record Number: [id number] Patient Account Number: [id number] Date of Birth/Sex: Treating RN: [birth year] (67 y.o. [name] Primary Care Jakarri Lesko: [name] Other Clinician: Referring Kolten Ryback: Treating Makara Lanzo/Extender: Alver Fisher in Treatment:  37 Compression Therapy Performed for Wound Assessment: Wound #7 Right,Medial Malleolus Performed By: Clinician Shawn Stall, RN Compression Type: Three Layer Post Procedure Diagnosis Same as Pre-procedure Electronic Signature(s) Signed: 04/20/2022 4:16:30 PM By: Shawn Stall RN, BSN Entered By: Shawn Stall on 04/20/2022 15:15:20 -------------------------------------------------------------------------------- Encounter Discharge Information Details Patient Name: Date of Service: Andreoni, [name] 04/20/2022 3:00 PM Medical Record Number: [id number] Patient Account Number: [id number] Date of Birth/Sex: Treating RN: 05-21-1955 (67 y.o. [name] Primary Care Breda Bond: [name] Other Clinician: Referring Albion Weatherholtz: Treating Ryelee Albee/Extender: Alver Fisher in Treatment: [phone] Encounter Discharge Information Items Post Procedure Vitals Discharge Condition: Stable Temperature (F): 98.3 Ambulatory Status: Walker Pulse (bpm): 94 Discharge Destination: Home Respiratory Rate (breaths/min): 18 Transportation: Private Auto Blood Pressure (mmHg): 119/83 Accompanied By: self Herbert Spires ([id number]) [id number] Page 2 of 5 Schedule Follow-up Appointment: Yes Clinical Summary of Care: Electronic Signature(s) Signed: 04/20/2022 4:16:30 PM By: Shawn Stall RN, BSN Entered By: Shawn Stall on 04/20/2022 15:17:05 -------------------------------------------------------------------------------- Lower Extremity Assessment Details Patient Name: Date of Service: Kilgour, [name] 04/20/2022 3:00 PM Medical Record Number: [id number] Patient Account Number: [id number] Date of Birth/Sex: Treating RN: 1955/07/03 (67 y.o. M) Primary Care Lux Skilton: [name] Other Clinician: Referring Kalen Ratajczak: Treating Yanelie Abraha/Extender: Alver Fisher in Treatment: 37 Edema Assessment Assessed: Kyra Searles: No] Franne Forts: No] Edema: [Left: N]  [Right: o] Calf Left: Right: Point of Measurement: 31 cm From Medial Instep 36 cm Ankle Left: Right: Point of Measurement: 11 cm From Medial Instep 26.5 cm Vascular Assessment Pulses: Dorsalis Pedis Palpable: [Right:Yes] Electronic Signature(s) Signed: 04/20/2022 3:56:59 PM By: Karl Ito Entered By: Karl Ito on 04/20/2022 14:59:28 -------------------------------------------------------------------------------- Multi-Disciplinary Care Plan Details Patient Name: Date of Service: Ducre, [name] 04/20/2022 3:00 PM Medical Record Number: [id number] Patient Account Number: [id number] Date of Birth/Sex: Treating RN: April 02, 1955 (67 y.o. [name] Primary Care Faraz Ponciano: [name] Other Clinician: Referring Shirel Mallis: Treating Mariska Daffin/Extender:  Stone III, Noah CharonHoyt [name], William Weeks in Treatment: 37 Multidisciplinary Care Plan reviewed with physician Active Inactive Pain, Acute or Chronic Nursing Diagnoses: Pain, acute or chronic: actual or potential Potential alteration in comfort, pain Goals: Patient will verbalize adequate pain control and receive pain control interventions during procedures as needed Date Initiated: 08/04/2021 Target Resolution Date: 05/13/2022 Goal Status: Active Patient/caregiver will verbalize comfort level met Date Initiated: 08/04/2021 Target Resolution Date: 05/13/2022 Herbert SpiresHUNT, Sayre D ([id number]) [id number] Page 3 of 5 Goal Status: Active Interventions: Encourage patient to take pain medications as prescribed Provide education on pain management Reposition patient for comfort Treatment Activities: Administer pain control measures as ordered : 08/04/2021 Notes: Electronic Signature(s) Signed: 04/20/2022 4:16:30 PM By: Shawn Stalleaton, Bobbi RN, BSN Entered By: Shawn Stalleaton, Bobbi on 04/20/2022 15:03:47 -------------------------------------------------------------------------------- Pain Assessment Details Patient Name: Date  of Service: Gabor, [name] 04/20/2022 3:00 PM Medical Record Number: [id number] Patient Account Number: [id number] Date of Birth/Sex: Treating RN: 20-Aug-1955 (67 y.o. M) Primary Care Rickia Freeburg: Martha ClanShaw, William Other Clinician: Referring Laurenashley Viar: Treating Leiyah Maultsby/Extender: Alver FisherStone III, Hoyt [name], William Weeks in Treatment: 37 Active Problems Location of Pain Severity and Description of Pain Patient Has Paino No Site Locations Pain Management and Medication Current Pain Management: Electronic Signature(s) Signed: 04/20/2022 3:56:59 PM By: Karl Itoawkins, Destiny Entered By: Karl Itoawkins, Destiny on 04/20/2022 14:59:37 -------------------------------------------------------------------------------- Patient/Caregiver Education Details Patient Name: Date of Service: Febus, DA Santa LighterNNY D. 4/10/2024andnbsp3:00 PM Medical Record Number: [id number] Patient Account Number: [id number] Date of Birth/Gender: Treating RN: 20-Aug-1955 (66 y.o. Tammy SoursM) Deaton, Bobbi Primary Care Physician: Martha ClanShaw, William Other Clinician: Referring Physician: Treating Physician/Extender: Alver FisherStone III, Hoyt [name], William Weeks in Treatment: [address] Education Assessment [name] PlaceHUNT, Bellefontaine NeighborsDANNY D ([id number]) [id number] Page 4 of 5 Education Provided To: Patient Education Topics Provided Wound/Skin Impairment: Handouts: Caring for Your Ulcer Methods: Explain/Verbal Responses: Reinforcements needed Electronic Signature(s) Signed: 04/20/2022 4:16:30 PM By: Shawn Stalleaton, Bobbi RN, BSN Entered By: Shawn Stalleaton, Bobbi on 04/20/2022 15:04:06 -------------------------------------------------------------------------------- Wound Assessment Details Patient Name: Date of Service: Mapp, [name] 04/20/2022 3:00 PM Medical Record Number: [id number] Patient Account Number: [id number] Date of Birth/Sex: Treating RN: 20-Aug-1955 (67 y.o. M) Primary Care Rasaan Brotherton: Martha ClanShaw, William Other Clinician: Referring Kaheem Halleck: Treating Anilah Huck/Extender: Alver FisherStone III,  Hoyt [name], William Weeks in Treatment: 37 Wound Status Wound Number: 7 Primary Venous Leg Ulcer Etiology: Wound Location: Right, Medial Malleolus Secondary Lymphedema Wounding Event: Blister Etiology: Date Acquired: 07/21/2021 Wound Open Weeks Of Treatment: 37 Status: Clustered Wound: No Comorbid Sleep Apnea, Arrhythmia, Congestive Heart Failure, History: Hypertension, Hypotension, Peripheral Arterial Disease, Peripheral Venous Disease, Type II Diabetes, Gout, Osteoarthritis Photos Wound Measurements Length: (cm) 4.4 Width: (cm) 2.9 Depth: (cm) 0.1 Area: (cm) 10.022 Volume: (cm) 1.002 % Reduction in Area: 73.8% % Reduction in Volume: 96.3% Wound Description Classification: Full Thickness With Exposed Support S Exudate Amount: Medium Exudate Type: Serosanguineous Exudate Color: red, brown tructures Periwound Skin Texture Texture Color No Abnormalities Noted: No No Abnormalities Noted: No Moisture No Abnormalities Noted: No Treatment Notes Rozetta NunneryHUNT, Aristides D ([id number]) [id number] Page 5 of 5 Wound #7 (Malleolus) Wound Laterality: Right, Medial Cleanser Peri-Wound Care Triamcinolone 15 (g) Discharge Instruction: Use triamcinolone 15 (g) as directed Zinc Oxide Ointment 30g tube Discharge Instruction: Apply Zinc Oxide to periwound with each dressing change Sween Lotion (Moisturizing lotion) Discharge Instruction: Apply moisturizing lotion as directed Topical Primary Dressing Maxorb Extra Ag+ Alginate Dressing, 2x2 (in/in) Discharge Instruction: Apply to wound bed as instructed Secondary Dressing Woven Gauze Sponge, Non-Sterile 4x4 in Discharge Instruction: Apply over primary dressing as directed. Zetuvit Plus 4x8 in  Discharge Instruction: Apply over primary dressing as directed. Secured With Compression Wrap ThreePress (3 layer compression wrap) Discharge Instruction: Apply three layer use kerlix instead of cotton compression as  directed. Compression Stockings Add-Ons Electronic Signature(s) Signed: 04/20/2022 3:56:59 PM By: Karl Ito Entered By: Karl Ito on 04/20/2022 15:01:42 -------------------------------------------------------------------------------- Vitals Details Patient Name: Date of Service: Desmarais, [name] 04/20/2022 3:00 PM Medical Record Number: [id number] Patient Account Number: [id number] Date of Birth/Sex: Treating RN: [birth year] (67 y.o. M) Primary Care Gita Dilger: [name] Other Clinician: Referring Idrees Quam: Treating Oriel Rumbold/Extender: Alver Fisher in Treatment: 37 Vital Signs Time Taken: 14:58 Temperature (F): 98.3 Height (in): 71 Pulse (bpm): 94 Weight (lbs): 350 Respiratory Rate (breaths/min): 18 Body Mass Index (BMI): 48.8 Blood Pressure (mmHg): 119/83 Reference Range: 80 - 120 mg / dl Electronic Signature(s) Signed: 04/20/2022 3:56:59 PM By: Karl Ito Entered By: Karl Ito on 04/20/2022 14:59:08

## 2022-04-22 ENCOUNTER — Encounter: Payer: Self-pay | Admitting: Internal Medicine

## 2022-04-22 ENCOUNTER — Other Ambulatory Visit (HOSPITAL_COMMUNITY): Payer: Self-pay

## 2022-04-22 NOTE — Telephone Encounter (Signed)
Advanced Heart Failure Patient Advocate Encounter  Called [employer] to check the status of the patient's application. Representative stated that the patient would need to apply for and be denied LIS before they would be willing to continue processing the application.   Sent Eileen Stanford (CSW) message to assist patient in applying for LIS. Called and updated the patient on next steps. Offered samples while awaiting for LIS process to be complete if he needs.  Will follow up.

## 2022-04-25 ENCOUNTER — Telehealth (HOSPITAL_COMMUNITY): Payer: Self-pay | Admitting: Licensed Clinical Social Worker

## 2022-04-25 NOTE — Addendum Note (Signed)
Addended by: Jacquiline Doe on: 04/25/2022 02:52 PM   Modules accepted: Orders

## 2022-04-25 NOTE — Telephone Encounter (Signed)
CSW received referral to assist with LIS application.  CSW attempted to call pt to discuss- unable to reach- left VM requesting return call when able.  Burna Sis, LCSW Clinical Social Worker [facility] Desk#: [phone] Cell#: [phone]

## 2022-04-25 NOTE — Telephone Encounter (Signed)
H&V Care Navigation CSW Progress Note  Clinical Social Worker assisted pt in completing LIS application- informed pt he should get notice in the mail in next 3-4 weeks.  No further needs at this time.   SDOH Screenings   Food Insecurity: No Food Insecurity (09/20/2021)  Housing: Low Risk  (09/20/2021)  Transportation Needs: No Transportation Needs (09/20/2021)  Utilities: Not At Risk (09/20/2021)  Depression (PHQ2-9): Low Risk  (10/05/2020)  Tobacco Use: Medium Risk (04/18/2022)   Burna Sis, LCSW Clinical Social Worker [facility] Desk#: [phone] Cell#: [phone]

## 2022-04-27 ENCOUNTER — Encounter (HOSPITAL_BASED_OUTPATIENT_CLINIC_OR_DEPARTMENT_OTHER): Payer: Medicare Other | Admitting: Physician Assistant

## 2022-04-27 DIAGNOSIS — L97518 Non-pressure chronic ulcer of other part of right foot with other specified severity: Secondary | ICD-10-CM | POA: Diagnosis not present

## 2022-04-27 DIAGNOSIS — I2510 Atherosclerotic heart disease of native coronary artery without angina pectoris: Secondary | ICD-10-CM | POA: Diagnosis not present

## 2022-04-27 DIAGNOSIS — L97828 Non-pressure chronic ulcer of other part of left lower leg with other specified severity: Secondary | ICD-10-CM | POA: Diagnosis not present

## 2022-04-27 DIAGNOSIS — I87331 Chronic venous hypertension (idiopathic) with ulcer and inflammation of right lower extremity: Secondary | ICD-10-CM | POA: Diagnosis not present

## 2022-04-27 DIAGNOSIS — I110 Hypertensive heart disease with heart failure: Secondary | ICD-10-CM | POA: Diagnosis not present

## 2022-04-27 DIAGNOSIS — L97312 Non-pressure chronic ulcer of right ankle with fat layer exposed: Secondary | ICD-10-CM | POA: Diagnosis not present

## 2022-04-27 DIAGNOSIS — E1151 Type 2 diabetes mellitus with diabetic peripheral angiopathy without gangrene: Secondary | ICD-10-CM | POA: Diagnosis not present

## 2022-04-27 DIAGNOSIS — E11621 Type 2 diabetes mellitus with foot ulcer: Secondary | ICD-10-CM | POA: Diagnosis not present

## 2022-04-27 DIAGNOSIS — Z6841 Body Mass Index (BMI) 40.0 and over, adult: Secondary | ICD-10-CM | POA: Diagnosis not present

## 2022-04-27 DIAGNOSIS — I509 Heart failure, unspecified: Secondary | ICD-10-CM | POA: Diagnosis not present

## 2022-04-27 DIAGNOSIS — I872 Venous insufficiency (chronic) (peripheral): Secondary | ICD-10-CM | POA: Diagnosis not present

## 2022-04-27 DIAGNOSIS — I87333 Chronic venous hypertension (idiopathic) with ulcer and inflammation of bilateral lower extremity: Secondary | ICD-10-CM | POA: Diagnosis not present

## 2022-04-27 DIAGNOSIS — L97818 Non-pressure chronic ulcer of other part of right lower leg with other specified severity: Secondary | ICD-10-CM | POA: Diagnosis not present

## 2022-04-27 DIAGNOSIS — I890 Lymphedema, not elsewhere classified: Secondary | ICD-10-CM | POA: Diagnosis not present

## 2022-04-27 NOTE — Progress Notes (Signed)
XAVYER, STEENSON ([id number]) [id number] Page 1 of 11 Visit Report for 04/27/2022 Chief Complaint Document Details Patient Name: Date of Service: [name], [name] 04/27/2022 3:00 PM Medical Record Number: [id number] Patient Account Number: [id number] Date of Birth/Sex: Treating RN: 1955/11/17 (67 y.o. M) Primary Care Provider: [name] Other Clinician: Referring Provider: Treating Provider/Extender: Alver Fisher in Treatment: 38 Information Obtained from: Patient Chief Complaint 08/04/2021; patient returns to clinic with bilateral leg wounds as well as areas on the right foot Electronic Signature(s) Signed: 04/27/2022 3:03:28 PM By: Allen Derry PA-C Entered By: Allen Derry on 04/27/2022 15:03:28 -------------------------------------------------------------------------------- Debridement Details Patient Name: Date of Service: [name], [name] 04/27/2022 3:00 PM Medical Record Number: [id number] Patient Account Number: [id number] Date of Birth/Sex: Treating RN: [birth year] (67 y.o. Marlan Palau Primary Care Provider: [name] Other Clinician: Referring Provider: Treating Provider/Extender: Alver Fisher in Treatment: 38 Debridement Performed for Assessment: Wound #7 Right,Medial Malleolus Performed By: Physician Lenda Kelp, PA Debridement Type: Debridement Severity of Tissue Pre Debridement: Fat layer exposed Level of Consciousness (Pre-procedure): Awake and Alert Pre-procedure Verification/Time Out Yes - 15:59 Taken: Start Time: 15:59 Pain Control: Lidocaine 5% topical ointment T Area Debrided (L x W): otal 4.6 (cm) x 3 (cm) = 13.8 (cm) Tissue and other material debrided: Skin: Epidermis Level: Skin/Epidermis Debridement Description: Selective/Open Wound Instrument: Curette Specimen: Tissue Culture Number of Specimens T aken: 1 Bleeding: Minimum Hemostasis Achieved: Pressure Response  to Treatment: Procedure was tolerated well Level of Consciousness (Post- Awake and Alert procedure): Post Debridement Measurements of Total Wound Length: (cm) 4.6 Width: (cm) 3 Depth: (cm) 0.3 Volume: (cm) 3.252 Character of Wound/Ulcer Post Debridement: Improved Severity of Tissue Post Debridement: Fat layer exposed Post Procedure Diagnosis Same as Pre-procedure Notes scribed for Allen Derry PA by Samuella Bruin, RN Electronic Signature(s) Signed: 04/27/2022 4:27:50 PM By: Dennard Schaumann [name] ([id number]) [id number] Page 2 of 11 Signed: 04/27/2022 6:42:55 PM By: Allen Derry PA-C Entered By: Samuella Bruin on 04/27/2022 16:06:03 -------------------------------------------------------------------------------- HPI Details Patient Name: Date of Service: [name], [name] 04/27/2022 3:00 PM Medical Record Number: [id number] Patient Account Number: [id number] Date of Birth/Sex: Treating RN: Oct 25, 1955 (67 y.o. M) Primary Care Provider: [name] Other Clinician: Referring Provider: Treating Provider/Extender: Alver Fisher in Treatment: 38 History of Present Illness HPI Description: ADMISSION 03/22/2021 This is a 67 year old man with a past medical history significant for diabetes type 2, congestive heart failure, peripheral arterial disease, morbid obesity, venous insufficiency, and coronary artery disease. He has been followed by Dr. Loreta Ave in podiatry, who performed a transmetatarsal amputation on the left foot in August 2022. He had issues healing that wound, but based upon Dr. Kenna Gilbert notes, ultimately the TMA wound healed. During his recovery from that surgery, however, ulcers opened up over the DIP joint of the right second and third toe. These have apparently closed and reopened multiple times. It sounds like one of the issues has been moisture accumulation and maceration of the tissues causing them to reopen.  At his last visit with Dr. Loreta Ave, on March 01, 2021, there continues to be problems with moisture and he was referred to wound care for further evaluation and management. He had a formal aortogram with runoff performed prior to his TMA. The findings are copied here: Patient has inline flow to both feet with no significant flow-limiting lesion that would be amenable to percutaneous or open revascularization. He does have an element  of small vessel disease and has a short segment occlusion of the distal anterior tibial/dorsalis pedis artery on the left foot but does have posterior tibial artery flow. Would recommend management of wounds with amputation of toes 2 and 3 on the right foot if the wounds do not heal and deteriorate. Transmetatarsal amputation on the left side has as good a blood supply as it is going to get and hopefully this will heal in the future. Formal ABIs were done in January 2023. They are normal bilaterally. ABI Findings: +---------+------------------+-----+----------+--------+ Right Rt Pressure (mmHg)IndexWaveform Comment  +---------+------------------+-----+----------+--------+ Brachial 160     +---------+------------------+-----+----------+--------+ PTA 192 1.06 monophasic  +---------+------------------+-----+----------+--------+ DP 159 0.88 monophasic  +---------+------------------+-----+----------+--------+ Great T oe145 0.80    +---------+------------------+-----+----------+--------+ +--------+------------------+-----+---------+-------+ Left Lt Pressure (mmHg)IndexWaveform Comment +--------+------------------+-----+---------+-------+ [id number]     +--------+------------------+-----+---------+-------+ PTA 204 1.13 triphasic  +--------+------------------+-----+---------+-------+ DP 194 1.07 biphasic    +--------+------------------+-----+---------+-------+ +-------+-----------+-----------+------------+------------+ ABI/TBIT oday's ABIT oday's TBIPrevious ABIPrevious TBI +-------+-----------+-----------+------------+------------+ Right 1.06 0.8 1.26 0.65  +-------+-----------+-----------+------------+------------+ Left 1.13 amputation 1.15 amputation  +-------+-----------+-----------+------------+------------+ Previous ABI on 08/06/20 at [facility] Pedal pressures falsely elevated due to medial calcification. Summary: Right: Resting right ankle-brachial index is within normal range. The right toe-brachial index is normal. Left: Resting left ankle-brachial index is within normal range. READMISSION 08/04/2021 Mr. Bondar is now a 67 year old man who I remember from this clinic many years ago I think he had a right lower extremity predominantly venous wound at the time. He was here for 1 visit in March of this year had wounds on his right second and third toes we apparently dressed them many and they healed so he did not come back. He is listed in [place] is being a diabetic although the patient denies this says he is verified it with his primary doctor. In any case over the last several weeks or so according the patient although these wounds look somewhat more chronic than that he has developed predominantly large wounds CAPTAIN, BLUCHER [name] ([id number]) [id number] Page 3 of 11 on the right medial and right lateral ankle smaller areas on the left leg and areas on the dorsal aspect of his right second and third toes. Its not clear how he has been dressing these. 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 The patient has known PAD. He had an angiogram in August 2022 at which time he had nonhealing wounds in both feet. On the left his major vessels in the thigh were all patent. He  had three-vessel patent to the level of the ankle. He had a very short occlusion in the left anterior tibial. On the right lower extremity the major vessels in his thigh were all patent. He had three-vessel runoff to the foot sluggish filling of the anterior tibial artery. He was felt to have some component of small vessel disease but nothing that was amenable or needed revascularization. It was recommended that he have amputation of the second and third toes on the right foot if they did not heal He has been following with Dr. Loreta Ave of podiatry. Dr. Loreta Ave got him juxta lite stockings although I do not think he had them on properly he has uncontrolled edema in both legs Past medical history includes type 2 diabetes [although the patient really denies this], left TMA in 2022,lower extremity wounds in fact attendance at this clinic in 2009-2010, A-fib on Eliquis, chronic venous insufficiency with secondary lymphedema history of non-Hodgkin's lymphoma. 08-11-2021 upon  evaluation today patient presents for follow-up evaluation he was seen last Wednesday initially for inspection here in our clinic. With that being said he tells me that he unfortunately has been having a lot of drainage and is actually coming through his wrap. Fortunately I do not see any evidence of active infection locally or systemically at this time which is great news. No fevers, chills, nausea, vomiting, or diarrhea. With that being said there does appear to be some evidence of local infection based on what I am seeing today. 08-18-2021 upon evaluation today patient appears to be doing okay currently in regard to his wounds with that being said that he is doing much better but still has a long ways to go to get to where he wanted to be. I think the infection is significantly improved. He has another week of the antibiotic at this point. 08-25-2021 upon evaluation today patient's wounds are actually doing decently well he has erythema has  significantly improved. I think the cellulitis is under controlled I am going to continue him on 2 more weeks of the Levaquin at 500 mg this is a lower dose but I am hoping it will be better for him. 09-08-2021 upon evaluation today patient appears to be doing excellent in regard to his wounds. Since I last saw him he was actually in the hospital where he ended up having a pacemaker put in. Subsequently he tells me that he is actually doing quite well although they were unsure whether they were going to do it due to the fact that he had the wounds on his legs. And then I am glad they did anything seem to be doing well. 09-15-2021 upon evaluation patient's wounds are actually showing signs of improvement. The right side wounds do appear to have some need for sharp debridement today and I Ernie Hew go ahead and proceed with that. I think that if we get the wounds cleaned up he will actually show signs of continued improvement. I am also leaning towards switching to [facility] which I think will be a much better option for him. 09-22-2021 patient's wounds are showing signs of excellent improvement. I am actually extremely pleased with where we stand and I think that the patient is making great progress. There does not appear to be any signs of active infection. 09-29-2021 upon evaluation today patient appears to be doing excellent in regard to his wounds. He is actually tolerating the dressing changes without complication. Fortunately I see no evidence of active infection locally or systemically at this time which is great news and overall I am extremely pleased with where we stand currently. [id number] upon evaluation today patient actually appears to be doing excellent in regard to his wounds. The left leg is almost completely healed the right ankle is significantly smaller. Overall I am extremely pleased with where we stand at this point. I do not see any evidence of active infection at this time. 10-13-2021  upon evaluation today patient appears to be doing excellent in regard to his wounds. I really feel like he is making good progress here and I am very pleased in that regard. Fortunately I do not see any signs of active infection at this time. We are using the [facility] topical antibiotic therapy. 10-20-2021 upon evaluation today patient appears to be doing well with regard to his wound on the right medial ankle region the left leg is almost completely healed. I am actually very pleased with where we stand today. 11-03-2021 upon  evaluation today patient's wound actually is going require some sharp debridement but appears to be doing much better which is great news. Fortunately I do not see any signs of active infection at this time. 11-10-2021 upon evaluation today patient's wound is actually showing signs of excellent improvement. Fortunately I do not see any evidence of infection locally or systemically which is great news and overall I am extremely pleased with where we stand today. I do believe he is making good progress he does have his [facility] topical antibiotics with him here today. 11-24-2021 upon evaluation today patient actually showing signs of excellent improvement this appears to be doing much better. Fortunately I do not see any evidence of infection locally or systemically at this time. 12-01-2021 upon evaluation today patient appears to be doing well currently in regard to his wound. He has been tolerating the dressing changes without complication. Fortunately I do not see any evidence of active infection at this time which is great news and overall I am extremely pleased with where we stand today. 12-15-2021 upon evaluation today patient appears to be doing well with regard to his wound. He is showing signs of improvement is slow but nonetheless we are making improvements here. Fortunately I do not see any evidence of active infection locally nor systemically at this time. We are still  using the [facility] topical antibiotic over the open area only. 12-29-2021 upon evaluation today patient's wound actually is showing signs of excellent improvement. It has been 2 weeks since I perform any debridement and it definitely shows he has some tissue that needs to be cleaned away but I think we can do so quite easily and readily today. The good news is I do not see any signs of infection I think he is doing much better in that regard. Overall I am extremely pleased with where we stand. 01-12-2022 upon evaluation today patient appears to be doing well currently in regard to his wound although it is not getting significantly smaller it is also not getting any larger. Fortunately I do not see any evidence of infection locally nor systemically which is great news and overall I am extremely pleased with where things stand currently. 01-19-2022 upon evaluation today patient appears to be doing well currently in regard to his wound. The PolyMem actually seems to have done extremely well for him. Fortunately I do not see any signs of infection locally nor systemically at this time. 01-26-2022 upon evaluation today patient appears to be making progress. Fortunately there does not appear to be any signs of infection which is great news. No fevers, chills, nausea, vomiting, or diarrhea. With that being said this is very slow to heal and although it is smaller I still feel like we may want to try to do something to speed this up I think that a skin substitute could be beneficial, look into Kerecis. 02-02-2022 upon evaluation today patient's wound is actually showing signs of excellent improvement. I do not see any evidence of infection and overall I think that we are headed in the right direction. Fortunately I think that he is tolerating the dressing changes without complication. 02-09-2022 upon evaluation today patient appears to be doing well currently in regard to his wound. It does look a little bit  macerated but fortunately does not appear to be showing any signs of significant skin breakdown over the macerated area which is good news. Fortunately I do not see any evidence of active infection locally nor systemically at this  point which is great news. 02-23-2022 upon evaluation today patient appears to be doing well currently in regard to his wounds. In fact his area on the ankle is actually showing signs of healing quite nicely. We do have the Apligraf ready today I am hopeful this is going to speed things up and get this closed much more effectively and quickly. [name], [name] ([id number]) [id number] Page 4 of 11 Fortunately I do not see any signs of active infection locally nor systemically at this time which is great news. 03-02-2022 upon evaluation today patient appears to be doing excellent in regard to his wound. He is actually been tolerating the dressing changes without complication the wound on the left medial lower extremity is doing quite well and Apligraf seems to have been extremely beneficial for him. I am extremely pleased with where we stand today. 03/16/2022: The wound measurements are smaller today. He has accumulation of a yellow crust around the edges and slough on the surface. There is a musty odor coming from the wound. He has been getting Apligraf. 03-30-2022 upon evaluation today patient appears to be making progress. In regard to his leg ulcer. This is slow but nonetheless the Apligraf has fed things up. Fortunately I do not see any evidence of infection locally nor systemically at this time. No fevers, chills, nausea, vomiting, or diarrhea. Patient is here for Apligraf #4 today. 04-13-2022 upon evaluation today patient appears to be doing a little bit more poorly in regard to his wound. He actually feels like his wrap may have been a little bit tight. Fortunately there does not appear to be any signs of active infection locally nor systemically  which is great news and I am pleased in that regard. 04-20-2022 upon evaluation today patient appears to be doing better in regard to his wound from the standpoint of swelling he is actually lost 33 pounds on torsemide in the past week his leg is significantly smaller compared to what it has been. Fortunately I do not see any signs of active infection locally nor systemically at this time. 04-27-2022 upon evaluation today patient's wound actually is showing signs of erythema and warmth around the edges of the wound I am actually concerned about the possibility of infection. I think we probably need to obtain a wound culture and also can recommend based on what we are seeing that we go ahead and have the patient continue to utilize the compression wrapping which I think has been of benefit. Electronic Signature(s) Signed: 04/27/2022 6:10:51 PM By: Allen Derry PA-C Entered By: Allen Derry on 04/27/2022 18:10:51 -------------------------------------------------------------------------------- Physical Exam Details Patient Name: Date of Service: [name], [name] 04/27/2022 3:00 PM Medical Record Number: [id number] Patient Account Number: [id number] Date of Birth/Sex: Treating RN: 12/17/1955 (67 y.o. M) Primary Care Provider: [name] Other Clinician: Referring Provider: Treating Provider/Extender: Alver Fisher in Treatment: 55 Constitutional Well-nourished and well-hydrated in no acute distress. Respiratory normal breathing without difficulty. Psychiatric this patient is able to make decisions and demonstrates good insight into disease process. Alert and Oriented x 3. pleasant and cooperative. Notes Upon inspection patient's wound actually showed some good granulation but also showed signs of erythema which do seem to be doing a little bit worse in general. I think he may be having some issues here with infection. Electronic Signature(s) Signed: 04/27/2022 6:11:50 PM  By: Allen Derry PA-C Entered By: Allen Derry on 04/27/2022 18:11:50 -------------------------------------------------------------------------------- Physician Orders Details Patient Name: Date of Service: [name],  [name] 04/27/2022 3:00 PM Medical Record Number: [id number] Patient Account Number: [id number] Date of Birth/Sex: Treating RN: 10-15-1955 (67 y.o. Marlan Palau Primary Care Provider: [name] Other Clinician: Referring Provider: Treating Provider/Extender: Alver Fisher in Treatment: 55 Verbal / Phone Orders: No Diagnosis Coding ICD-10 Coding Code Description I89.0 Lymphedema, not elsewhere classified I87.333 Chronic venous hypertension (idiopathic) with ulcer and inflammation of bilateral lower extremity DEARIES, MEIKLE [name] ([id number]) [id number] Page 5 of 11 [phone] Non-pressure chronic ulcer of other part of left lower leg with other specified severity L97.818 Non-pressure chronic ulcer of other part of right lower leg with other specified severity E11.621 Type 2 diabetes mellitus with foot ulcer L97.518 Non-pressure chronic ulcer of other part of right foot with other specified severity Follow-up Appointments ppointment in 1 week. Leonard Schwartz Wednesday 05/04/2022 3pm room 9 Return A Anesthetic (In clinic) Topical Lidocaine 5% applied to wound bed Cellular or Tissue Based Products Cellular or Tissue Based Product Type: - Run IVR for Kerrecis- denied #1 Apligraf applied 02/23/2022 #2 Apligraf applied 03/02/2022 #3 Apligraf applied 03/16/2022 #4 Apligraf applied 03/30/2022 HOLD APLIGRAF this week. Cellular or Tissue Based Product applied to wound bed, secured with steri-strips, cover with Adaptic or Mepitel. (DO NOT REMOVE). Bathing/ Shower/ Hygiene May shower with protection but do not get wound dressing(s) wet. Protect dressing(s) with water repellant cover (for example, large plastic bag) or a cast cover and may then  take shower. Edema Control - Lymphedema / SCD / Other Elevate legs to the level of the heart or above for 30 minutes daily and/or when sitting for 3-4 times a day throughout the day. Avoid standing for long periods of time. Exercise regularly Moisturize legs daily. - apply every night before bed to left leg. Compression stocking or Garment 30-40 mm/Hg pressure to: - wear your VIVE compression stocking to left leg. apply in the morning and remove at night. Will apply tubigrip size [name] to left leg. Remove once you get home and use your compression stockings. Wound Treatment Wound #7 - Malleolus Wound Laterality: Right, Medial Peri-Wound Care: Triamcinolone 15 (g) 1 x Per Week/30 Days Discharge Instructions: Use triamcinolone 15 (g) as directed Peri-Wound Care: Zinc Oxide Ointment 30g tube 1 x Per Week/30 Days Discharge Instructions: Apply Zinc Oxide to periwound with each dressing change Peri-Wound Care: Sween Lotion (Moisturizing lotion) 1 x Per Week/30 Days Discharge Instructions: Apply moisturizing lotion as directed Prim Dressing: Maxorb Extra Ag+ Alginate Dressing, 2x2 (in/in) 1 x Per Week/30 Days ary Discharge Instructions: Apply to wound bed as instructed Secondary Dressing: Woven Gauze Sponge, Non-Sterile 4x4 in 1 x Per Week/30 Days Discharge Instructions: Apply over primary dressing as directed. Secondary Dressing: Zetuvit Plus 4x8 in 1 x Per Week/30 Days Discharge Instructions: Apply over primary dressing as directed. Compression Wrap: ThreePress (3 layer compression wrap) 1 x Per Week/30 Days Discharge Instructions: Apply three layer use kerlix instead of cotton compression as directed. Laboratory naerobe culture (MICRO) - PCR of nonhealing wound to right leg - (ICD10 L97.818 - Non- Bacteria identified in Unspecified specimen by A pressure chronic ulcer of other part of right lower leg with other specified severity) LOINC Code: 635-3 Convenience Name: Anaerobic culture Patient  Medications llergies: latex, ACE Inhibitors, penicillin A Notifications Medication Indication Start End 04/27/2022 lidocaine DOSE topical 5 % ointment - ointment topical 04/27/2022 levofloxacin DOSE 1 - oral 500 mg tablet - 1 tablet oral once daily x 14 days Electronic Signature(s) Signed: 04/27/2022 6:12:53 PM By:  Allen Derry [place], [place] [name] ([id number]) [id number].pdf Page 6 of 11 Signed: 04/27/2022 6:12:53 PM By: Allen Derry PA-C Previous Signature: 04/27/2022 4:27:50 PM Version By: Samuella Bruin Entered By: Allen Derry on 04/27/2022 18:12:52 -------------------------------------------------------------------------------- Problem List Details Patient Name: Date of Service: [name], [name] 04/27/2022 3:00 PM Medical Record Number: [id number] Patient Account Number: [id number] Date of Birth/Sex: Treating RN: February 26, 1955 (67 y.o. Harlon Flor, Millard.Loa Primary Care Provider: [name] Other Clinician: Referring Provider: Treating Provider/Extender: Alver Fisher in Treatment: 38 Active Problems ICD-10 Encounter Code Description Active Date MDM Diagnosis I89.0 Lymphedema, not elsewhere classified 08/04/2021 No Yes I87.333 Chronic venous hypertension (idiopathic) with ulcer and inflammation of 08/04/2021 No Yes bilateral lower extremity L97.828 Non-pressure chronic ulcer of other part of left lower leg with other specified 08/04/2021 No Yes severity L97.818 Non-pressure chronic ulcer of other part of right lower leg with other specified 08/04/2021 No Yes severity E11.621 Type 2 diabetes mellitus with foot ulcer 08/04/2021 No Yes L97.518 Non-pressure chronic ulcer of other part of right foot with other specified 08/04/2021 No Yes severity Inactive Problems Resolved Problems Electronic Signature(s) Signed: 04/27/2022 3:03:22 PM By: Allen Derry PA-C Entered By: Allen Derry on 04/27/2022  15:03:22 -------------------------------------------------------------------------------- Progress Note Details Patient Name: Date of Service: [name], [name] 04/27/2022 3:00 PM Medical Record Number: [id number] Patient Account Number: [id number] Date of Birth/Sex: Treating RN: [birth year] (67 y.o. M) Primary Care Provider: [name] Other Clinician: Referring Provider: Treating Provider/Extender: Alver Fisher in Treatment: 38 Subjective Chief Complaint Information obtained from Patient 08/04/2021; patient returns to clinic with bilateral leg wounds as well as areas on the right foot [name], [name] ([id number]) [id number] Page 7 of 11 History of Present Illness (HPI) ADMISSION 03/22/2021 This is a 67 year old man with a past medical history significant for diabetes type 2, congestive heart failure, peripheral arterial disease, morbid obesity, venous insufficiency, and coronary artery disease. He has been followed by Dr. Loreta Ave in podiatry, who performed a transmetatarsal amputation on the left foot in August 2022. He had issues healing that wound, but based upon Dr. Kenna Gilbert notes, ultimately the TMA wound healed. During his recovery from that surgery, however, ulcers opened up over the DIP joint of the right second and third toe. These have apparently closed and reopened multiple times. It sounds like one of the issues has been moisture accumulation and maceration of the tissues causing them to reopen. At his last visit with Dr. Loreta Ave, on March 01, 2021, there continues to be problems with moisture and he was referred to wound care for further evaluation and management. He had a formal aortogram with runoff performed prior to his TMA. The findings are copied here: Patient has inline flow to both feet with no significant flow-limiting lesion that would be amenable to percutaneous or open revascularization. He does have an element of  small vessel disease and has a short segment occlusion of the distal anterior tibial/dorsalis pedis artery on the left foot but does have posterior tibial artery flow. Would recommend management of wounds with amputation of toes 2 and 3 on the right foot if the wounds do not heal and deteriorate. Transmetatarsal amputation on the left side has as good a blood supply as it is going to get and hopefully this will heal in the future. Formal ABIs were done in January 2023. They are normal bilaterally. ABI Findings: +---------+------------------+-----+----------+--------+ Right Rt Pressure (mmHg)IndexWaveform Comment  +---------+------------------+-----+----------+--------+ Brachial 160    +---------+------------------+-----+----------+--------+ PTA  192 1.06 monophasic  +---------+------------------+-----+----------+--------+ DP 159 0.88 monophasic  +---------+------------------+-----+----------+--------+ Great T oe145 0.80    +---------+------------------+-----+----------+--------+ +--------+------------------+-----+---------+-------+ Left Lt Pressure (mmHg)IndexWaveform Comment +--------+------------------+-----+---------+-------+ [id number]    +--------+------------------+-----+---------+-------+ PTA 204 1.13 triphasic  +--------+------------------+-----+---------+-------+ DP 194 1.07 biphasic   +--------+------------------+-----+---------+-------+ +-------+-----------+-----------+------------+------------+ ABI/TBIT oday's ABIT oday's TBIPrevious ABIPrevious TBI +-------+-----------+-----------+------------+------------+ Right 1.06 0.8 1.26 0.65  +-------+-----------+-----------+------------+------------+ Left 1.13 amputation 1.15 amputation  +-------+-----------+-----------+------------+------------+ Previous ABI on 08/06/20 at [facility] Pedal pressures falsely elevated due to medial  calcification. Summary: Right: Resting right ankle-brachial index is within normal range. The right toe-brachial index is normal. Left: Resting left ankle-brachial index is within normal range. READMISSION 08/04/2021 Mr. Langlinais is now a 67 year old man who I remember from this clinic many years ago I think he had a right lower extremity predominantly venous wound at the time. He was here for 1 visit in March of this year had wounds on his right second and third toes we apparently dressed them many and they healed so he did not come back. He is listed in [place] is being a diabetic although the patient denies this says he is verified it with his primary doctor. In any case over the last several weeks or so according the patient although these wounds look somewhat more chronic than that he has developed predominantly large wounds on the right medial and right lateral ankle smaller areas on the left leg and areas on the dorsal aspect of his right second and third toes. Its not clear how he has been dressing these. 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 The patient has known PAD. He had an angiogram in August 2022 at which time he had nonhealing wounds in both feet. On the left his major vessels in the thigh were all patent. He had three-vessel patent to the level of the ankle. He had a very short occlusion in the left anterior tibial. On the right lower extremity the major vessels in his thigh were all patent. He had three-vessel runoff to the foot sluggish filling of the anterior tibial artery. He was felt to have some component of small vessel disease but nothing that was amenable or needed revascularization. It was recommended that he have amputation of the second and third toes on the right foot if they did not heal He has been following with Dr. Loreta Ave of podiatry. Dr. Loreta Ave got him juxta lite stockings although I  do not think he had them on properly he has uncontrolled edema in both legs Past medical history includes type 2 diabetes [although the patient really denies this], left TMA in 2022,lower extremity wounds in fact attendance at this clinic in 2009-2010, A-fib on Eliquis, chronic venous insufficiency with secondary lymphedema history of non-Hodgkin's lymphoma. 08-11-2021 upon evaluation today patient presents for follow-up evaluation he was seen last Wednesday initially for inspection here in our clinic. With that being said he tells me that he unfortunately has been having a lot of drainage and is actually coming through his wrap. Fortunately I do not see any evidence of active infection locally or systemically at this time which is great news. No fevers, chills, nausea, vomiting, or diarrhea. With that being said there does appear to be some evidence of local infection based on what I am seeing today. [name], [name] ([id number]) [id number] Page 8 of 11 08-18-2021 upon evaluation today patient appears to be doing okay currently in regard to his wounds with  that being said that he is doing much better but still has a long ways to go to get to where he wanted to be. I think the infection is significantly improved. He has another week of the antibiotic at this point. 08-25-2021 upon evaluation today patient's wounds are actually doing decently well he has erythema has significantly improved. I think the cellulitis is under controlled I am going to continue him on 2 more weeks of the Levaquin at 500 mg this is a lower dose but I am hoping it will be better for him. 09-08-2021 upon evaluation today patient appears to be doing excellent in regard to his wounds. Since I last saw him he was actually in the hospital where he ended up having a pacemaker put in. Subsequently he tells me that he is actually doing quite well although they were unsure whether they were going to do it due to the  fact that he had the wounds on his legs. And then I am glad they did anything seem to be doing well. 09-15-2021 upon evaluation patient's wounds are actually showing signs of improvement. The right side wounds do appear to have some need for sharp debridement today and I Ernie Hew go ahead and proceed with that. I think that if we get the wounds cleaned up he will actually show signs of continued improvement. I am also leaning towards switching to [facility] which I think will be a much better option for him. 09-22-2021 patient's wounds are showing signs of excellent improvement. I am actually extremely pleased with where we stand and I think that the patient is making great progress. There does not appear to be any signs of active infection. 09-29-2021 upon evaluation today patient appears to be doing excellent in regard to his wounds. He is actually tolerating the dressing changes without complication. Fortunately I see no evidence of active infection locally or systemically at this time which is great news and overall I am extremely pleased with where we stand currently. 04-979 upon evaluation today patient actually appears to be doing excellent in regard to his wounds. The left leg is almost completely healed the right ankle is significantly smaller. Overall I am extremely pleased with where we stand at this point. I do not see any evidence of active infection at this time. 10-13-2021 upon evaluation today patient appears to be doing excellent in regard to his wounds. I really feel like he is making good progress here and I am very pleased in that regard. Fortunately I do not see any signs of active infection at this time. We are using the [facility] topical antibiotic therapy. 10-20-2021 upon evaluation today patient appears to be doing well with regard to his wound on the right medial ankle region the left leg is almost completely healed. I am actually very pleased with where we stand today. 11-03-2021  upon evaluation today patient's wound actually is going require some sharp debridement but appears to be doing much better which is great news. Fortunately I do not see any signs of active infection at this time. 11-10-2021 upon evaluation today patient's wound is actually showing signs of excellent improvement. Fortunately I do not see any evidence of infection locally or systemically which is great news and overall I am extremely pleased with where we stand today. I do believe he is making good progress he does have his [facility] topical antibiotics with him here today. 11-24-2021 upon evaluation today patient actually showing signs of excellent improvement this appears to  be doing much better. Fortunately I do not see any evidence of infection locally or systemically at this time. 12-01-2021 upon evaluation today patient appears to be doing well currently in regard to his wound. He has been tolerating the dressing changes without complication. Fortunately I do not see any evidence of active infection at this time which is great news and overall I am extremely pleased with where we stand today. 12-15-2021 upon evaluation today patient appears to be doing well with regard to his wound. He is showing signs of improvement is slow but nonetheless we are making improvements here. Fortunately I do not see any evidence of active infection locally nor systemically at this time. We are still using the [facility] topical antibiotic over the open area only. 12-29-2021 upon evaluation today patient's wound actually is showing signs of excellent improvement. It has been 2 weeks since I perform any debridement and it definitely shows he has some tissue that needs to be cleaned away but I think we can do so quite easily and readily today. The good news is I do not see any signs of infection I think he is doing much better in that regard. Overall I am extremely pleased with where we stand. 01-12-2022 upon evaluation  today patient appears to be doing well currently in regard to his wound although it is not getting significantly smaller it is also not getting any larger. Fortunately I do not see any evidence of infection locally nor systemically which is great news and overall I am extremely pleased with where things stand currently. 01-19-2022 upon evaluation today patient appears to be doing well currently in regard to his wound. The PolyMem actually seems to have done extremely well for him. Fortunately I do not see any signs of infection locally nor systemically at this time. 01-26-2022 upon evaluation today patient appears to be making progress. Fortunately there does not appear to be any signs of infection which is great news. No fevers, chills, nausea, vomiting, or diarrhea. With that being said this is very slow to heal and although it is smaller I still feel like we may want to try to do something to speed this up I think that a skin substitute could be beneficial, look into Kerecis. 02-02-2022 upon evaluation today patient's wound is actually showing signs of excellent improvement. I do not see any evidence of infection and overall I think that we are headed in the right direction. Fortunately I think that he is tolerating the dressing changes without complication. 02-09-2022 upon evaluation today patient appears to be doing well currently in regard to his wound. It does look a little bit macerated but fortunately does not appear to be showing any signs of significant skin breakdown over the macerated area which is good news. Fortunately I do not see any evidence of active infection locally nor systemically at this point which is great news. 02-23-2022 upon evaluation today patient appears to be doing well currently in regard to his wounds. In fact his area on the ankle is actually showing signs of healing quite nicely. We do have the Apligraf ready today I am hopeful this is going to speed things up and get  this closed much more effectively and quickly. Fortunately I do not see any signs of active infection locally nor systemically at this time which is great news. 03-02-2022 upon evaluation today patient appears to be doing excellent in regard to his wound. He is actually been tolerating the dressing changes without complication the  wound on the left medial lower extremity is doing quite well and Apligraf seems to have been extremely beneficial for him. I am extremely pleased with where we stand today. 03/16/2022: The wound measurements are smaller today. He has accumulation of a yellow crust around the edges and slough on the surface. There is a musty odor coming from the wound. He has been getting Apligraf. 03-30-2022 upon evaluation today patient appears to be making progress. In regard to his leg ulcer. This is slow but nonetheless the Apligraf has fed things up. Fortunately I do not see any evidence of infection locally nor systemically at this time. No fevers, chills, nausea, vomiting, or diarrhea. Patient is here for Apligraf #4 today. 04-13-2022 upon evaluation today patient appears to be doing a little bit more poorly in regard to his wound. He actually feels like his wrap may have been a little bit tight. Fortunately there does not appear to be any signs of active infection locally nor systemically which is great news and I am pleased in that regard. 04-20-2022 upon evaluation today patient appears to be doing better in regard to his wound from the standpoint of swelling he is actually lost 33 pounds on torsemide in the past week his leg is significantly smaller compared to what it has been. Fortunately I do not see any signs of active infection locally nor systemically at this time. [name], [name] ([id number]) [id number] Page 9 of 11 04-27-2022 upon evaluation today patient's wound actually is showing signs of erythema and warmth around the edges of the wound I am actually  concerned about the possibility of infection. I think we probably need to obtain a wound culture and also can recommend based on what we are seeing that we go ahead and have the patient continue to utilize the compression wrapping which I think has been of benefit. Objective Constitutional Well-nourished and well-hydrated in no acute distress. Vitals Time Taken: 2:55 PM, Height: 71 in, Weight: 350 lbs, BMI: 48.8, Temperature: 97.9 F, Pulse: 93 bpm, Respiratory Rate: 20 breaths/min, Blood Pressure: 130/86 mmHg. Respiratory normal breathing without difficulty. Psychiatric this patient is able to make decisions and demonstrates good insight into disease process. Alert and Oriented x 3. pleasant and cooperative. General Notes: Upon inspection patient's wound actually showed some good granulation but also showed signs of erythema which do seem to be doing a little bit worse in general. I think he may be having some issues here with infection. Integumentary (Hair, Skin) Wound #7 status is Open. Original cause of wound was Blister. The date acquired was: 07/21/2021. The wound has been in treatment 38 weeks. The wound is located on the Right,Medial Malleolus. The wound measures 4.6cm length x 3cm width x 0.3cm depth; 10.838cm^2 area and 3.252cm^3 volume. There is a medium amount of serosanguineous drainage noted. Assessment Active Problems ICD-10 Lymphedema, not elsewhere classified Chronic venous hypertension (idiopathic) with ulcer and inflammation of bilateral lower extremity Non-pressure chronic ulcer of other part of left lower leg with other specified severity Non-pressure chronic ulcer of other part of right lower leg with other specified severity Type 2 diabetes mellitus with foot ulcer Non-pressure chronic ulcer of other part of right foot with other specified severity Procedures Wound #7 Pre-procedure diagnosis of Wound #7 is a Venous Leg Ulcer located on the Right,Medial Malleolus  .Severity of Tissue Pre Debridement is: Fat layer exposed. There was a Selective/Open Wound Skin/Epidermis Debridement with a total area of 13.8 sq cm performed by Lenda Kelp, PA.  With the following instrument(s): Curette Material removed includes Skin: Epidermis after achieving pain control using Lidocaine 5% topical ointment. 1 specimen was taken by a Tissue Culture and sent to the lab per facility protocol. A time out was conducted at 15:59, prior to the start of the procedure. A Minimum amount of bleeding was controlled with Pressure. The procedure was tolerated well. Post Debridement Measurements: 4.6cm length x 3cm width x 0.3cm depth; 3.252cm^3 volume. Character of Wound/Ulcer Post Debridement is improved. Severity of Tissue Post Debridement is: Fat layer exposed. Post procedure Diagnosis Wound #7: Same as Pre-Procedure General Notes: scribed for Allen Derry PA by Samuella Bruin, RN. Pre-procedure diagnosis of Wound #7 is a Venous Leg Ulcer located on the Right,Medial Malleolus . There was a Three Layer Compression Therapy Procedure by Samuella Bruin, RN. Post procedure Diagnosis Wound #7: Same as Pre-Procedure Plan Follow-up Appointments: Return Appointment in 1 week. Leonard Schwartz Wednesday 05/04/2022 3pm room 9 Anesthetic: (In clinic) Topical Lidocaine 5% applied to wound bed Cellular or Tissue Based Products: Cellular or Tissue Based Product Type: - Run IVR for Kerrecis- denied #1 Apligraf applied 02/23/2022 #2 Apligraf applied 03/02/2022 #3 Apligraf applied 03/16/2022 #4 Apligraf applied 03/30/2022 HOLD APLIGRAF this week. Cellular or Tissue Based Product applied to wound bed, secured with steri-strips, cover with Adaptic or Mepitel. (DO NOT REMOVE). Bathing/ Shower/ HygieneJURIS, [name] ([id number]) [id number] Page 10 of 11 May shower with protection but do not get wound dressing(s) wet. Protect dressing(s) with water repellant cover (for example, large  plastic bag) or a cast cover and may then take shower. Edema Control - Lymphedema / SCD / Other: Elevate legs to the level of the heart or above for 30 minutes daily and/or when sitting for 3-4 times a day throughout the day. Avoid standing for long periods of time. Exercise regularly Moisturize legs daily. - apply every night before bed to left leg. Compression stocking or Garment 30-40 mm/Hg pressure to: - wear your VIVE compression stocking to left leg. apply in the morning and remove at night. Will apply tubigrip size [name] to left leg. Remove once you get home and use your compression stockings. Laboratory ordered were: Anaerobic culture - PCR of nonhealing wound to right leg The following medication(s) was prescribed: lidocaine topical 5 % ointment ointment topical was prescribed at facility levofloxacin oral 500 mg tablet 1 1 tablet oral once daily x 14 days starting 04/27/2022 WOUND #7: - Malleolus Wound Laterality: Right, Medial Peri-Wound Care: Triamcinolone 15 (g) 1 x Per Week/30 Days Discharge Instructions: Use triamcinolone 15 (g) as directed Peri-Wound Care: Zinc Oxide Ointment 30g tube 1 x Per Week/30 Days Discharge Instructions: Apply Zinc Oxide to periwound with each dressing change Peri-Wound Care: Sween Lotion (Moisturizing lotion) 1 x Per Week/30 Days Discharge Instructions: Apply moisturizing lotion as directed Prim Dressing: Maxorb Extra Ag+ Alginate Dressing, 2x2 (in/in) 1 x Per Week/30 Days ary Discharge Instructions: Apply to wound bed as instructed Secondary Dressing: Woven Gauze Sponge, Non-Sterile 4x4 in 1 x Per Week/30 Days Discharge Instructions: Apply over primary dressing as directed. Secondary Dressing: Zetuvit Plus 4x8 in 1 x Per Week/30 Days Discharge Instructions: Apply over primary dressing as directed. Com pression Wrap: ThreePress (3 layer compression wrap) 1 x Per Week/30 Days Discharge Instructions: Apply three layer use kerlix instead of cotton  compression as directed. 1. I would recommend that we go ahead and place him back on Levaquin we have done this before and has tolerated it very well this was back  actually in September it has been quite sometime since we have had him on any antibiotics. He is in agreement with the plan. For that reason I will go ahead and see that sent into the pharmacy and we will subsequently see where things stand at follow-up next week. 2. I am good recommend as well that we have the patient continue to elevate his legs is much as possible to help with edema control the swelling is much better if we get the infection under control I think will be doing really well at this point. 3. I did obtain a PCR culture today we will send the results out and will get them back we will make any adjustments in care as we need to. We will see patient back for reevaluation in 1 week here in the clinic. If anything worsens or changes patient will contact our office for additional recommendations. Electronic Signature(s) Signed: 04/27/2022 6:14:21 PM By: Allen Derry PA-C Previous Signature: 04/27/2022 6:13:16 PM Version By: Allen Derry PA-C Entered By: Allen Derry on 04/27/2022 18:14:21 -------------------------------------------------------------------------------- SuperBill Details Patient Name: Date of Service: Six, [name] 04/27/2022 Medical Record Number: [id number] Patient Account Number: [id number] Date of Birth/Sex: Treating RN: 1955-10-21 (67 y.o. Harlon Flor, Millard.Loa Primary Care Provider: [name] Other Clinician: Referring Provider: Treating Provider/Extender: Alver Fisher in Treatment: 38 Diagnosis Coding ICD-10 Codes Code Description I89.0 Lymphedema, not elsewhere classified I87.333 Chronic venous hypertension (idiopathic) with ulcer and inflammation of bilateral lower extremity L97.828 Non-pressure chronic ulcer of other part of left lower leg with other specified  severity L97.818 Non-pressure chronic ulcer of other part of right lower leg with other specified severity E11.621 Type 2 diabetes mellitus with foot ulcer L97.518 Non-pressure chronic ulcer of other part of right foot with other specified severity Facility Procedures Physician Procedures : CPT4 Code Description Modifier [id number] - WC PHYS LEVEL 4 - EST PT 25 ICD-10 Diagnosis Description I89.0 Lymphedema, not elsewhere classified I87.333 Chronic venous hypertension (idiopathic) with ulcer and inflammation of bilateral lower extremity  L97.828 Non-pressure chronic ulcer of other part of left lower leg with other specified severity L97.818 Non-pressure chronic ulcer of other part of right lower leg with other specified severity Quantity: 1 : [id number] - WC PHYS DEBR WO ANESTH 20 SQ CM ICD-10 Diagnosis Description L97.818 Non-pressure chronic ulcer of other part of right lower leg with other specified severity Quantity: 1 Electronic Signature(s) Signed: 04/27/2022 6:15:34 PM By: Allen Derry PA-C Entered By: Allen Derry on 04/27/2022 18:15:34

## 2022-04-28 NOTE — Progress Notes (Signed)
Remote ICD transmission.   

## 2022-04-29 ENCOUNTER — Telehealth (HOSPITAL_COMMUNITY): Payer: Self-pay

## 2022-04-29 NOTE — Progress Notes (Signed)
-  [facility] CLINIC NOTE   PCP: [name], MD HF Cardiologist: Dr. Gala Romney  HPI: [name] is a 67 y.o.y/o male w/ h/o chronic systolic heart failure, chronic afib, OSA, HTN, HLD, type 2DM, OSA on CPAP ,ILD, tobacco use and venous stasis ulcers, followed by wound clinic. Also history of chemotherapy with potential cardiotoxic effects (R-CHOP for lymphoma in 2011).    Admitted in 02/2017 for acute hypoxic respiratory failure requiring intubation and ultimately tracheostomy. This was in the setting of multifocal PNA/ influenza and new onset atrial fibrillation w/ RVR. Echo showed mod LVH and severely reduced LVEF 25-30% w/ diffuse HK. RV normal. At the time, his CM was felt to be viral. Did not get cath. Per d/c summary, DCCV not pursued. Afib treated w/ rate control and Eliquis.    Repeat echo 07/2017 EF improved to 50-55%, RV mildly dilated w/ mildly reduced systolic function. He was noted to be in afib at the time of study.    08/2017, wore Zio that showed Afib, well rated controlled.    Echo 11/2018 EF 45-50%, RV mildly reduced    Unfortunately, lost to f/u by cardiology for a period of time. Had been seeing [facility] for PAD, s/p left transmetatarsal amputation.  Echo 8/23 showed EF 30-35%, severe LVH, moderately reduced RV systolic function, moderate TVR.    Admitted 8/23 with pre-syncope, ECG showed AF w/ SVR, HR 28 BPM, RBBB, had several 2 sec pauses. Beta blocker and other GDMT held with AKI and hypotension. He developed worsening periods of high-grade AV block/atrial fibrillation with slow ventricular response and lengthy pauses w/ hypotension. Underwent TVP with improvement in symptoms. R/LHC showed diffuse nonobstructive CAD, bi-V failure R>L, mild to moderate pulmonary venous hypertension. EP consulted and he underwent BiV ICD. Hospitalization complicated by chronic venous stasis wounds. Able to transition to GDMT as AKI resolved. Discharged home with [facility] PT/OT, weight 328  lbs.  Echo 01/04/22: EF 30% RV moderately down   Today he returns for HF follow up with his best friend. Overall feeling fine. Continues with LE swelling. He has SOB walking short distances on flat ground, no issues with ADLs. Denies palpitations, CP, dizziness, or PND/Orthopnea. Appetite ok. No fever or chills. Weight at home 338 pounds. Taking all medications. Followed by wound center. He works as a Producer, television/film/video. Takes 40 mg torsemide on weekends. He wears CPAP at night.  Cardiac Studies - Echo (12/23): EF 30%, RV moderately down  - PYP (10/23): equivocal grade 1, H/CL = 1.06  - Echo (8/23): EF 30-35%, moderately decreased LV function, RV moderate reduced, moderate TR  - R/LHC (8/23):   Mid RCA lesion is 40% stenosed.   RPDA lesion is 95% stenosed.   Ost LM lesion is 30% stenosed.   Mid LAD lesion is 30% stenosed.   2nd Diag lesion is 50% stenosed.   Mid Cx lesion is 30% stenosed.   Ao = 109/58 (78) LV = 119/19 RA =  21 RV = 57/19 PA = 56/22 (36) PCW = 24 (v = 30) Fick cardiac output/index = 7.3/2.7 PVR = 1.8 WU Ao sat = 99% PA sat = 68%, 69% PAPi = 2.1   1. Mostly non-obstructive CAD with high-grade lesion in mid to distal RPDA 2. Nonischemic CM  3. Mild to moderate pulmonary venous HTN with evidence of RV dysfunction   Plan/Discussion: Continue medical therapy. If patient develops angina can consider PCI of PDA; otherwise medical therapy.    Past Medical  History:  Diagnosis Date   Acute systolic HF (heart failure)    Arthritis    lt foot, hips knees    Atrial fibrillation    Diabetes mellitus, new onset    Diverticulosis    Dyslipidemia    Gout attack 01/16/2012   Hypertension    Internal hemorrhoids    Lymphoma    remission for about 2 years, chemo 3 years prior   Pneumonia due to COVID-19 virus 07/2018   Pulmonary hypertension    Sepsis 02/2017   secondary to influenza; requiring trach   Tubular adenoma of colon    Venous stasis ulcers     Current  Outpatient Medications  Medication Sig Dispense Refill   acetaminophen (TYLENOL) 325 MG tablet Take 650 mg by mouth every 6 (six) hours as needed for moderate pain.      ascorbic acid (VITAMIN C) 500 MG tablet Take 1 tablet (500 mg total) by mouth 3 (three) times daily. (Patient taking differently: Take 500 mg by mouth 2 (two) times daily.) 30 tablet 0   aspirin 81 MG chewable tablet Chew 1 tablet (81 mg total) by mouth daily. 30 tablet 11   atorvastatin (LIPITOR) 40 MG tablet TAKE 1 TABLET BY MOUTH EVERY DAY 30 tablet 11   carvedilol (COREG) 3.125 MG tablet Take 1 tablet (3.125 mg total) by mouth 2 (two) times daily. 180 tablet 3   cholecalciferol (VITAMIN D) 25 MCG (1000 UNIT) tablet Take 1 tablet (1,000 Units total) by mouth daily. 30 tablet 0   colchicine 0.6 MG tablet Take 0.6 mg by mouth 2 (two) times daily as needed.     dapagliflozin propanediol (FARXIGA) 10 MG TABS tablet Take 1 tablet (10 mg total) by mouth daily. 90 tablet 1   ELIQUIS 5 MG TABS tablet TAKE 1 TABLET BY MOUTH TWICE A DAY 180 tablet 1   Omega 3 340 MG CPDR Take 1 capsule (340 mg total) by mouth 2 (two) times daily. (Patient taking differently: Take 340 mg by mouth daily.) 60 capsule 0   omeprazole (PRILOSEC) 40 MG capsule Take 1 capsule (40 mg total) by mouth daily. 30 capsule 0   potassium chloride SA (KLOR-CON M) 20 MEQ tablet Take 2 tablets (40 mEq total) by mouth daily. 60 tablet 8   sacubitril-valsartan (ENTRESTO) 97-103 MG Take 1 tablet by mouth 2 (two) times daily. 60 tablet 11   sertraline (ZOLOFT) 50 MG tablet Take 1 tablet (50 mg total) by mouth daily. 30 tablet 0   spironolactone (ALDACTONE) 25 MG tablet Take 1 tablet (25 mg total) by mouth daily. 30 tablet 11   torsemide (DEMADEX) 20 MG tablet Take 2 tablets (40 mg total) by mouth daily. 60 tablet 8   vitamin B-12 (CYANOCOBALAMIN) 1000 MCG tablet Take 1 tablet (1,000 mcg total) by mouth daily. 30 tablet 0   No current facility-administered medications for this  visit.   Allergies  Allergen Reactions   Latex Hives and Swelling   Ace Inhibitors     Other reaction(s): cough   Penicillin G     Other reaction(s): rash Has tolerated amoxicillin   Social History   Socioeconomic History   Marital status: Divorced    Spouse name: Not on file   Number of children: Not on file   Years of education: Not on file   Highest education level: Not on file  Occupational History   Occupation: cosmetologist  Tobacco Use   Smoking status: Former    Packs/day: 0.50  Years: 20.00    Additional pack years: 0.00    Total pack years: 10.00    Types: Cigarettes    Quit date: 01/10/2009    Years since quitting: 13.3   Smokeless tobacco: Never  Vaping Use   Vaping Use: Never used  Substance and Sexual Activity   Alcohol use: Not Currently    Comment: 2-3 times per week   Drug use: No   Sexual activity: Not on file  Other Topics Concern   Not on file  Social History Narrative   Not on file   Social Determinants of Health   Financial Resource Strain: Not on file  Food Insecurity: No Food Insecurity (09/20/2021)   Hunger Vital Sign    Worried About Running Out of Food in the Last Year: Never true    Ran Out of Food in the Last Year: Never true  Transportation Needs: No Transportation Needs (09/20/2021)   PRAPARE - Administrator, Civil Service (Medical): No    Lack of Transportation (Non-Medical): No  Physical Activity: Not on file  Stress: Not on file  Social Connections: Not on file  Intimate Partner Violence: Not on file   Family History  Problem Relation Age of Onset   Dementia Brother 42       frontal lobe    Dementia Mother    Arthritis Mother    Hypertension Mother    CVA Sister 70   Colon cancer Neg Hx    Pancreatic cancer Neg Hx    Stomach cancer Neg Hx    Rectal cancer Neg Hx    Liver cancer Neg Hx    There were no vitals taken for this visit.  Wt Readings from Last 3 Encounters:  04/18/22 (!) 139.6 kg (307 lb  12.8 oz)  04/11/22 (!) 155 kg (341 lb 12.8 oz)  03/21/22 (!) 152.5 kg (336 lb 1.6 oz)   PHYSICAL EXAM: General:  NAD. No resp difficulty, walked into clinic HEENT: Normal Neck: Supple. Thick neck but JVP 9-10. Carotids 2+ bilat; no bruits. No lymphadenopathy or thryomegaly appreciated. Cor: PMI nondisplaced. Regular rate & rhythm. No rubs, gallops or murmurs. Lungs: Clear Abdomen: Soft, obese, nontender, + distended. No hepatosplenomegaly. No bruits or masses. Good bowel sounds. Extremities: No cyanosis, clubbing, rash, 2+ BLE edema, RLE wrapped Neuro: Alert & oriented x 3, cranial nerves grossly intact. Moves all 4 extremities w/o difficulty. Affect pleasant.  MDT device interrogation (personally reviewed): OptiVol up. 100% BiV pacing, no VT/AF, 1 hr/day activity  ASSESSMENT & PLAN: Acute on chronic Systolic Biventricular Heart Failure - Echo (2/19): EF 25-30% w/ diffuse HK. RV normal. Felt to be viral CM +/- component of tachymediated in setting of influenza PNA and rapid Afib  - Echo (7/19): EF recovered, 50-55%, RV mildly reduced - Echo (11/20): EF 45-50%, RV mildly reduced (in rate controlled afib)  - Echo (8/23): EF 30-35%, severe LVH, moderately reduced RV systolic function, moderate TR - R/LHC (8/23): diffuse non-obstructive CAD with 90% lesion in dLAD, BiV failure R>L: RA 21 PA 56/22 (36) PCW 24 Fick 7.3/2.7 PaPi 2.1 - Myeloma panel and urine immunofixation negative.  - PYP scan 10/23 equivocal. Resent Genetic testing. - s/p CRT-D  - Echo 12/23: EF 30% RV moderately down  - Stable NYHA III, confounded by body habitus and LE wounds. Volume up today, weight up 24 lbs. - Start torsemide 40 mg bid w/ 40 KCL bid x 3 days, then torsemide 40 mg daily w/ 40 KCL  daily. - Continue Entresto 97/103 mg bid. - Continue spironolactone 25 mg daily. - Continue Farxiga 10 mg daily.  - Continue carvedilol 3.125 mg bid  - Labs today. Repeat labs at follow up in 2 weeks   2. Profound  Bradycardia w/ High-grade AV block - s/p BiV ICD - Interrogation as above   3. Chronic Afib - Dates back to at least 2019, has been historically well rate controlled - Continue CPAP.  - Continue Eliquis. No bleeding issues.   4. PAD/ Chronic Venous Stasis Wounds  - s/p left transmetatarsal amputation - Followed by [facility] and Wound clinics  5. H/o Lymphoma - s/p R-CHOP in 2011 (potential cardiotoxic effects)    6. Type 2DM - Hgb A1c 6.0 - On SGLT2i    7. CAD - LHC (8/23) diffuse mostly non-obstructive CAD, not cause of CM. - No s/s angina - If has angina, can consider PCI PDA - Continue medical management with ASA/statin.   8. Morbid obesity - There is no height or weight on file to calculate BMI. - Needs weight loss. - Off Mounjaro, PCP trying to get PA for Ozempic.   9. ILD - Hi rest chest CT nonspecific, but ? NSIP vs chronic hypersensitivity pneumonitis - Followed by Pulmonary.   Follow up in 2-3 weeks with APP for fluid check (consider Furoscix), and 4 months with Dr. Gala Romney  Jacklynn Ganong, FNP  4:36 PM

## 2022-04-29 NOTE — Progress Notes (Signed)
ZAVION, SLEIGHT ([id number]) [id number] Page 1 of 5 Visit Report for 04/27/2022 Arrival Information Details Patient Name: Date of Service: [name], [name] 04/27/2022 3:00 PM Medical Record Number: [id number] Patient Account Number: [id number] Date of Birth/Sex: Treating RN: October 22, 1955 (67 y.o. Harlon Flor, Millard.Loa Primary Care Verlon Pischke: [name] Other Clinician: Referring Tashaun Obey: Treating Gracemarie Skeet/Extender: Alver Fisher in Treatment: 38 Visit Information History Since Last Visit Added or deleted any medications: No Patient Arrived: [name] Any new allergies or adverse reactions: No Arrival Time: 14:55 Had a fall or experienced change in No Accompanied By: self activities of daily living that may affect Transfer Assistance: None risk of falls: Patient Identification Verified: Yes Signs or symptoms of abuse/neglect since last visito No Secondary Verification Process Completed: Yes Hospitalized since last visit: No Patient Requires Transmission-Based Precautions: No Implantable device outside of the clinic excluding No Patient Has Alerts: Yes cellular tissue based products placed in the center Patient Alerts: Patient on Blood Thinner since last visit: 01/2021 ABI L 1.13 R 1.06 Has Dressing in Place as Prescribed: Yes 01/2021 TBI L amp R 0.8 Has Compression in Place as Prescribed: Yes Pain Present Now: No Electronic Signature(s) Signed: 04/28/2022 2:41:21 PM By: Shawn Stall RN, BSN Entered By: Shawn Stall on 04/27/2022 14:55:19 -------------------------------------------------------------------------------- Compression Therapy Details Patient Name: Date of Service: Seese, [name] 04/27/2022 3:00 PM Medical Record Number: [id number] Patient Account Number: [id number] Date of Birth/Sex: Treating RN: 1955-11-04 (67 y.o. Marlan Palau Primary Care Mccabe [name]: [name] Other Clinician: Referring Khristie Sak: Treating  Malaki Koury/Extender: Alver Fisher in Treatment: 38 Compression Therapy Performed for Wound Assessment: Wound #7 Right,Medial Malleolus Performed By: Clinician Samuella Bruin, RN Compression Type: Three Layer Post Procedure Diagnosis Same as Pre-procedure Electronic Signature(s) Signed: 04/27/2022 4:27:50 PM By: Samuella Bruin Entered By: Samuella Bruin on 04/27/2022 16:06:15 -------------------------------------------------------------------------------- Encounter Discharge Information Details Patient Name: Date of Service: Stillion, [name] 04/27/2022 3:00 PM Medical Record Number: [id number] Patient Account Number: [id number] Date of Birth/Sex: Treating RN: 06-28-1955 (67 y.o. Marlan Palau Primary Care Inza Mikrut: [name] Other Clinician: Referring Adaia Matthies: Treating Tamberly Pomplun/Extender: Alver Fisher in Treatment: 38 Encounter Discharge Information Items Post Procedure Vitals Discharge Condition: Stable Temperature (F): 97.9 Ambulatory Status: Walker Pulse (bpm): 93 Discharge Destination: Home Respiratory Rate (breaths/min): 20 Transportation: Private Auto Blood Pressure (mmHg): 130/86 RIGOBERTO, REPASS D ([id number]) [id number] Page 2 of 5 Accompanied By: self Schedule Follow-up Appointment: Yes Clinical Summary of Care: Patient Declined Electronic Signature(s) Signed: 04/27/2022 4:27:50 PM By: Samuella Bruin Entered By: Samuella Bruin on 04/27/2022 16:23:58 -------------------------------------------------------------------------------- Lower Extremity Assessment Details Patient Name: Date of Service: Pingree, [name] 04/27/2022 3:00 PM Medical Record Number: [id number] Patient Account Number: [id number] Date of Birth/Sex: Treating RN: May 02, 1955 (67 y.o. [name] Primary Care Evertte Sones: [name] Other Clinician: Referring Fallynn Gravett: Treating Shamela Haydon/Extender: Alver Fisher in Treatment: 38 Edema Assessment Assessed: Kyra Searles: No] Franne Forts: No] Edema: [Left: N] [Right: o] Calf Left: Right: Point of Measurement: 31 cm From Medial Instep 37 cm Ankle Left: Right: Point of Measurement: 11 cm From Medial Instep 25.5 cm Electronic Signature(s) Signed: 04/28/2022 2:41:21 PM By: Shawn Stall RN, BSN Entered By: Shawn Stall on 04/27/2022 14:59:59 -------------------------------------------------------------------------------- Multi-Disciplinary Care Plan Details Patient Name: Date of Service: Milos, [name] 04/27/2022 3:00 PM Medical Record Number: [id number] Patient Account Number: [id number] Date of Birth/Sex: Treating RN: 1955/05/28 (67 y.o. [name] Primary Care Dillon Mcreynolds: [name]  Other Clinician: Referring Ranika Mcniel: Treating Irva Loser/Extender: Alver Fisher in Treatment: 38 Multidisciplinary Care Plan reviewed with physician Active Inactive Pain, Acute or Chronic Nursing Diagnoses: Pain, acute or chronic: actual or potential Potential alteration in comfort, pain Goals: Patient will verbalize adequate pain control and receive pain control interventions during procedures as needed Date Initiated: 08/04/2021 Target Resolution Date: 05/13/2022 Goal Status: Active Patient/caregiver will verbalize comfort level met Date Initiated: 08/04/2021 Target Resolution Date: 05/13/2022 Goal Status: Active Interventions: Encourage patient to take pain medications as prescribed Provide education on pain management Reposition patient for comfort JAEQUAN, PROPES ([id number]) [id number] Page 3 of 5 Treatment Activities: Administer pain control measures as ordered : 08/04/2021 Notes: Electronic Signature(s) Signed: 04/28/2022 2:41:21 PM By: Shawn Stall RN, BSN Entered By: Shawn Stall on 04/27/2022  15:03:20 -------------------------------------------------------------------------------- Pain Assessment Details Patient Name: Date of Service: Enslin, [name] 04/27/2022 3:00 PM Medical Record Number: [id number] Patient Account Number: [id number] Date of Birth/Sex: Treating RN: 06-10-1955 (67 y.o. [name] Primary Care Misha Vanoverbeke: [name] Other Clinician: Referring Nathaneal Sommers: Treating Mckaylin Bastien/Extender: Alver Fisher in Treatment: 38 Active Problems Location of Pain Severity and Description of Pain Patient Has Paino No Site Locations Pain Management and Medication Current Pain Management: Electronic Signature(s) Signed: 04/28/2022 2:41:21 PM By: Shawn Stall RN, BSN Entered By: Shawn Stall on 04/27/2022 14:55:35 -------------------------------------------------------------------------------- Patient/Caregiver Education Details Patient Name: Date of Service: Gens, [name] 4/17/2024andnbsp3:00 PM Medical Record Number: [id number] Patient Account Number: [id number] Date of Birth/Gender: Treating RN: 05-07-1955 (66 y.o. [name] Primary Care Physician: [name] Other Clinician: Referring Physician: Treating Physician/Extender: Alver Fisher in Treatment: 31 Education Assessment Education Provided To: Patient Education Topics Provided Wound/Skin ImpairmentOREN, BARELLA ([id number]) [id number] Page 4 of 5 Handouts: Caring for Your Ulcer Methods: Explain/Verbal Responses: Reinforcements needed Electronic Signature(s) Signed: 04/28/2022 2:41:21 PM By: Shawn Stall RN, BSN Entered By: Shawn Stall on 04/27/2022 15:03:33 -------------------------------------------------------------------------------- Wound Assessment Details Patient Name: Date of Service: Calderone, [name] 04/27/2022 3:00 PM Medical Record Number: [id number] Patient Account Number: [id number] Date of Birth/Sex:  Treating RN: 1955/03/20 (67 y.o. Harlon Flor, Millard.Loa Primary Care Gardy Montanari: [name] Other Clinician: Referring Jacaden Forbush: Treating Branae Crail/Extender: Alver Fisher in Treatment: 38 Wound Status Wound Number: 7 Primary Venous Leg Ulcer Etiology: Wound Location: Right, Medial Malleolus Secondary Lymphedema Wounding Event: Blister Etiology: Date Acquired: 07/21/2021 Wound Open Weeks Of Treatment: 38 Status: Clustered Wound: No Comorbid Sleep Apnea, Arrhythmia, Congestive Heart Failure, History: Hypertension, Hypotension, Peripheral Arterial Disease, Peripheral Venous Disease, Type II Diabetes, Gout, Osteoarthritis Photos Wound Measurements Length: (cm) 4.6 Width: (cm) 3 Depth: (cm) 0.3 Area: (cm) 10.838 Volume: (cm) 3.252 % Reduction in Area: 71.7% % Reduction in Volume: 87.9% Wound Description Classification: Full Thickness With Exposed Support Exudate Amount: Medium Exudate Type: Serosanguineous Exudate Color: red, brown Structures Periwound Skin Texture Texture Color No Abnormalities Noted: No No Abnormalities Noted: No Moisture No Abnormalities Noted: No Treatment Notes Wound #7 (Malleolus) Wound Laterality: Right, Medial Cleanser Peri-Wound Care Triamcinolone 15 (g) Pearlman, Mivaan D ([id number]) [id number] Page 5 of 5 Discharge Instruction: Use triamcinolone 15 (g) as directed Zinc Oxide Ointment 30g tube Discharge Instruction: Apply Zinc Oxide to periwound with each dressing change Sween Lotion (Moisturizing lotion) Discharge Instruction: Apply moisturizing lotion as directed Topical Primary Dressing Maxorb Extra Ag+ Alginate Dressing, 2x2 (in/in) Discharge Instruction: Apply to wound bed as instructed Secondary Dressing Woven Gauze Sponge, Non-Sterile 4x4 in Discharge  Instruction: Apply over primary dressing as directed. Zetuvit Plus 4x8 in Discharge Instruction: Apply over primary dressing as  directed. Secured With Compression Wrap ThreePress (3 layer compression wrap) Discharge Instruction: Apply three layer use kerlix instead of cotton compression as directed. Compression Stockings Add-Ons Electronic Signature(s) Signed: 04/28/2022 2:41:21 PM By: Shawn Stall RN, BSN Signed: 04/29/2022 11:38:03 AM By: Thayer Dallas Entered By: Thayer Dallas on 04/27/2022 15:02:59 -------------------------------------------------------------------------------- Vitals Details Patient Name: Date of Service: [place], [name] 04/27/2022 3:00 PM Medical Record Number: [id number] Patient Account Number: [id number] Date of Birth/Sex: Treating RN: 1955-04-25 (67 y.o. Harlon Flor, Millard.Loa Primary Care Trygg Mantz: [name] Other Clinician: Referring Barb Shear: Treating Blasa Raisch/Extender: Alver Fisher in Treatment: 38 Vital Signs Time Taken: 14:55 Temperature (F): 97.9 Height (in): 71 Pulse (bpm): 93 Weight (lbs): 350 Respiratory Rate (breaths/min): 20 Body Mass Index (BMI): 48.8 Blood Pressure (mmHg): 130/86 Reference Range: 80 - 120 mg / dl Electronic Signature(s) Signed: 04/28/2022 2:41:21 PM By: Shawn Stall RN, BSN Entered By: Shawn Stall on 04/27/2022 14:55:31

## 2022-04-29 NOTE — Telephone Encounter (Signed)
Called and was unable to leave patient a voice message to confirm/remind patient of their appointment at the [facility] on 05/02/22.

## 2022-04-30 IMAGING — DX DG FOOT COMPLETE 3+V*L*
2 series · 3 of 3 positions shown · non-contrast
Comparison: Left foot radiograph dated 08/03/2020.

CLINICAL DATA: 65-year-old male status post amputation of the
distal foot.

EXAM:
LEFT FOOT - COMPLETE 3+ VIEW

[Series 1: foot · 0.14mm/px · 2 of 2 slices shown]
[im 1/2]
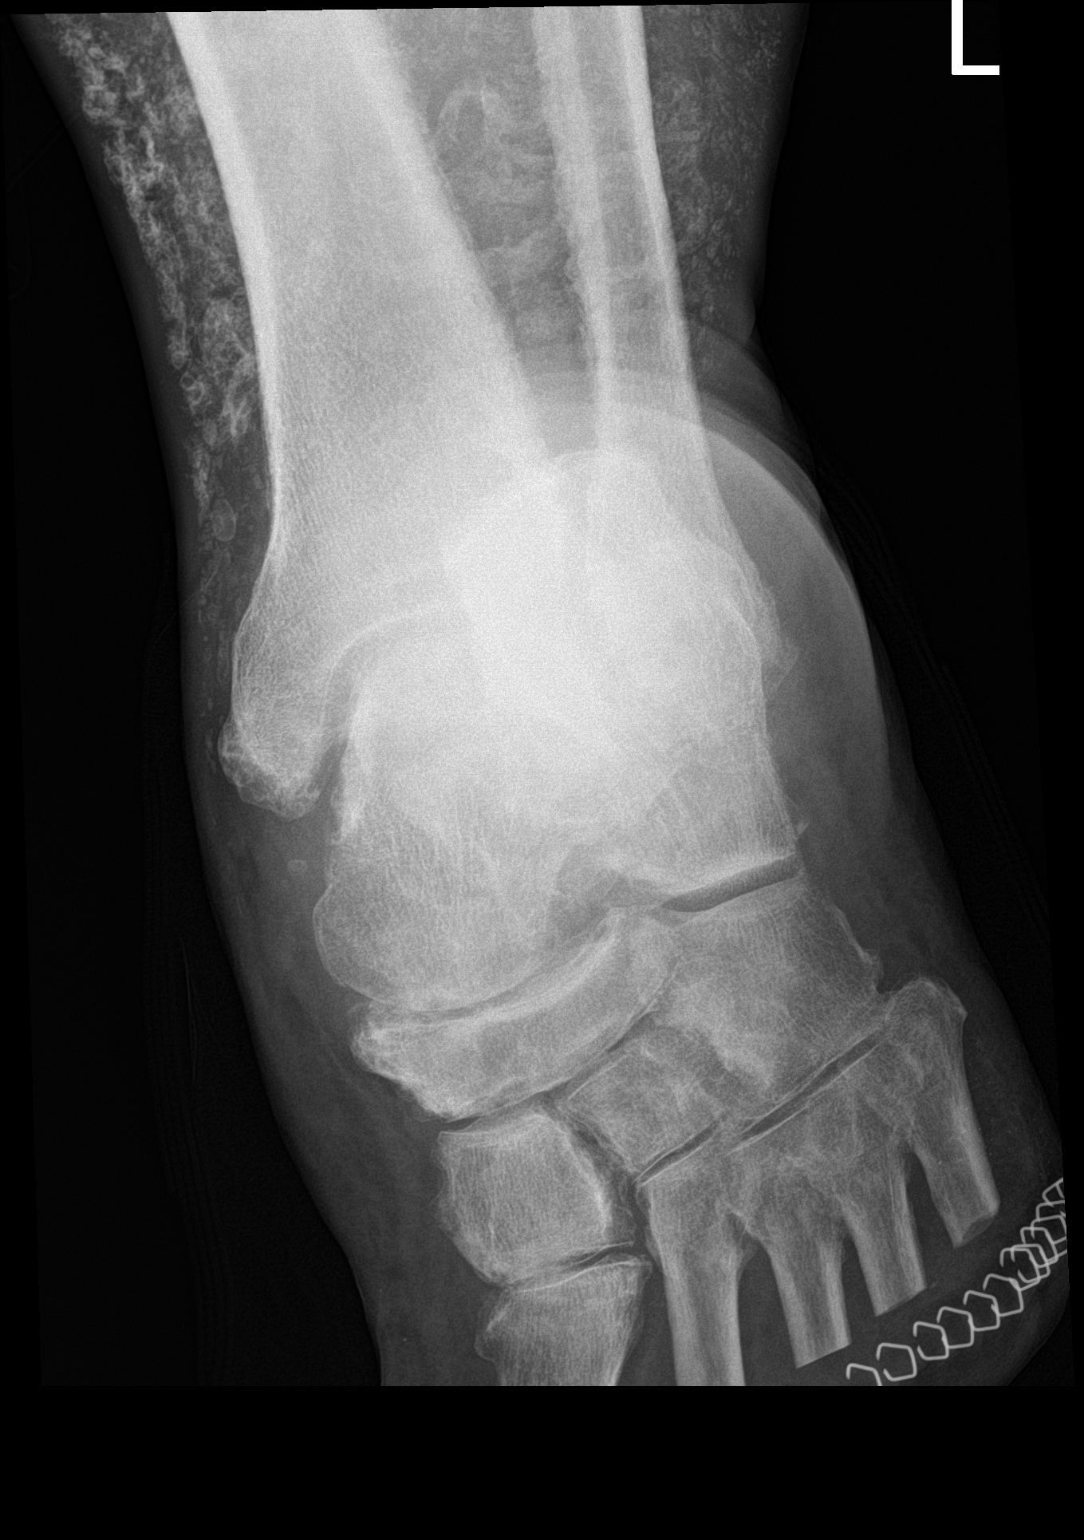
[im 2/2]
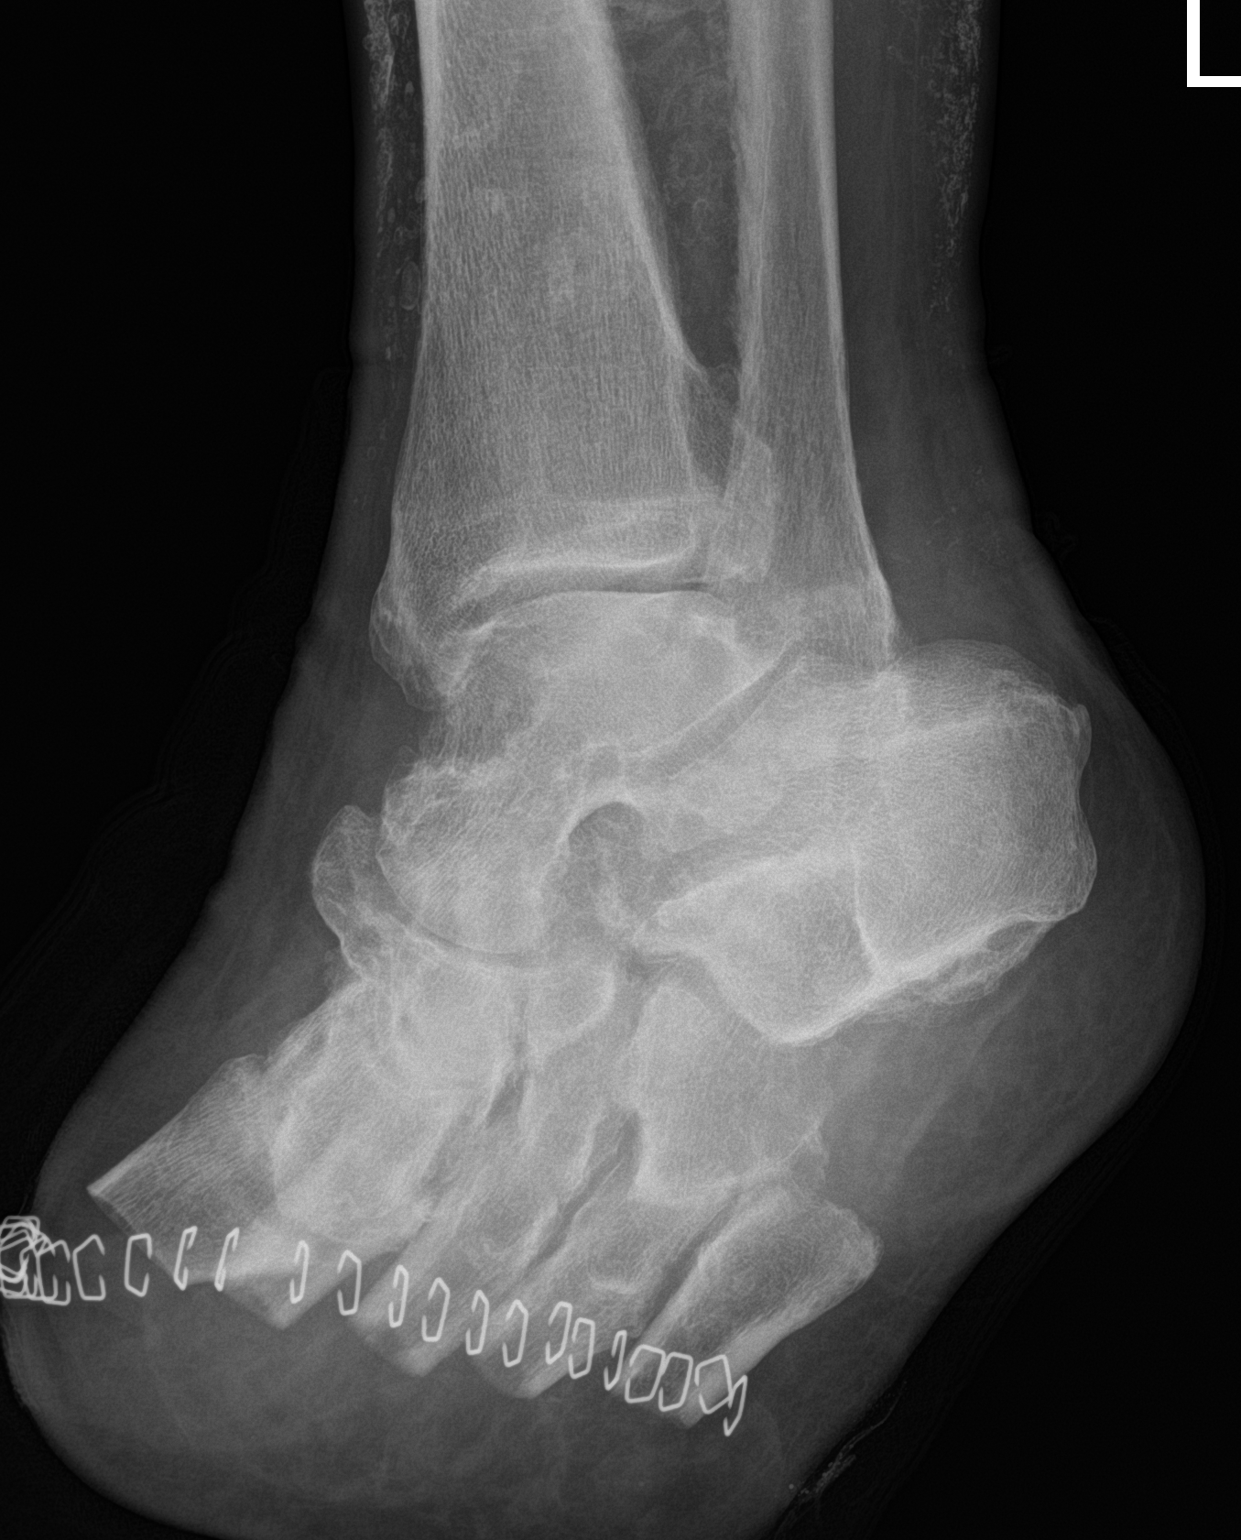

[leg]
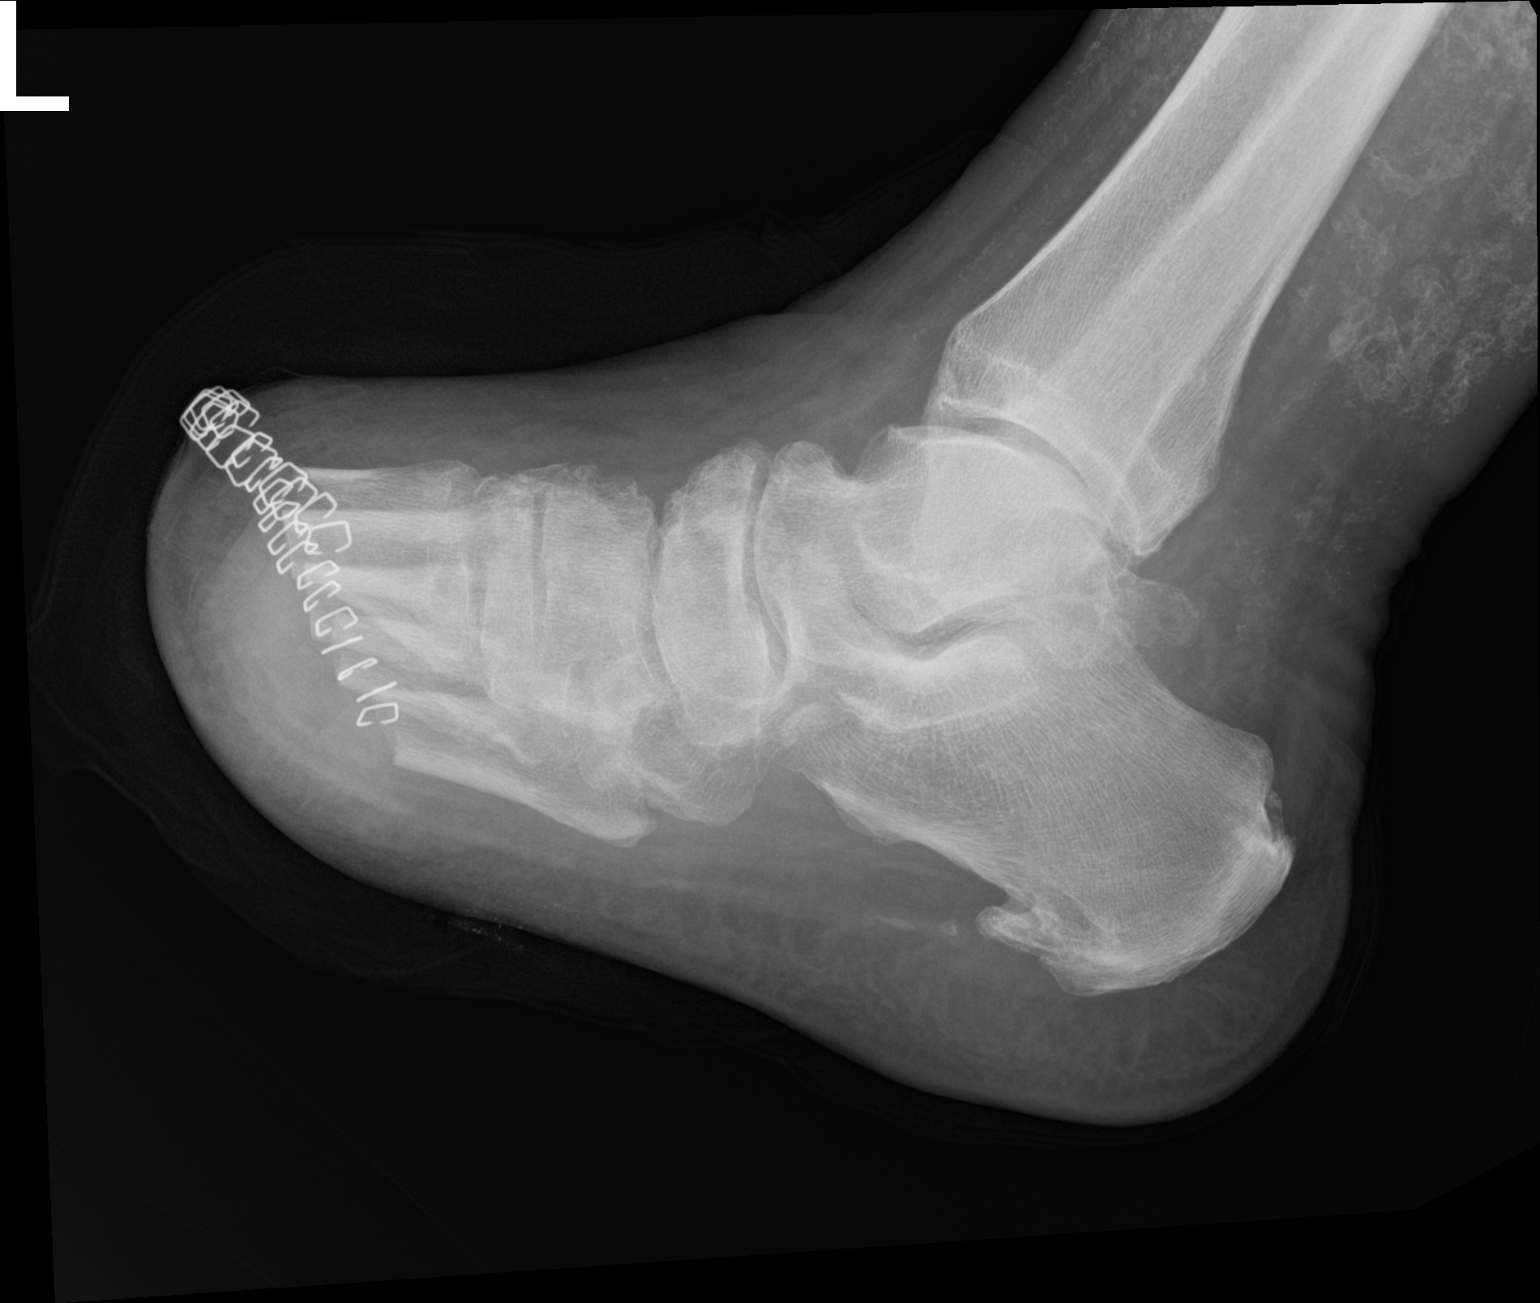

[3 of 3 positions shown; findings below may reference images not displayed]

FINDINGS: Transmetatarsal amputation of the foot. The surgical edges appear
sharp. There is no acute fracture or dislocation. The bones are
osteopenic. There is extensive degenerative and Charcot changes of
the midfoot and tarsometatarsal joints. There is diffuse soft tissue
swelling. Subcutaneous calcification of the distal calf may be
related to chronic venous stasis or underlying connective tissue
disorder. Skin staples noted over the stump.
IMPRESSION: Transmetatarsal amputation of the foot.

## 2022-05-02 ENCOUNTER — Ambulatory Visit (HOSPITAL_COMMUNITY)
Admission: RE | Admit: 2022-05-02 | Discharge: 2022-05-02 | Disposition: A | Discharge status: Home / Self Care | Payer: Medicare Other | Source: Ambulatory Visit | Attending: Family Medicine | Admitting: Family Medicine

## 2022-05-02 ENCOUNTER — Encounter (HOSPITAL_COMMUNITY): Payer: Self-pay

## 2022-05-02 ENCOUNTER — Telehealth (HOSPITAL_COMMUNITY): Payer: Self-pay

## 2022-05-02 VITALS — BP 118/80 | HR 84 | Wt 310.0 lb

## 2022-05-02 DIAGNOSIS — Z79899 Other long term (current) drug therapy: Secondary | ICD-10-CM | POA: Diagnosis not present

## 2022-05-02 DIAGNOSIS — Z7901 Long term (current) use of anticoagulants: Secondary | ICD-10-CM | POA: Insufficient documentation

## 2022-05-02 DIAGNOSIS — I4510 Unspecified right bundle-branch block: Secondary | ICD-10-CM | POA: Insufficient documentation

## 2022-05-02 DIAGNOSIS — I2510 Atherosclerotic heart disease of native coronary artery without angina pectoris: Secondary | ICD-10-CM | POA: Diagnosis not present

## 2022-05-02 DIAGNOSIS — R001 Bradycardia, unspecified: Secondary | ICD-10-CM | POA: Diagnosis not present

## 2022-05-02 DIAGNOSIS — I5082 Biventricular heart failure: Secondary | ICD-10-CM | POA: Insufficient documentation

## 2022-05-02 DIAGNOSIS — Z87891 Personal history of nicotine dependence: Secondary | ICD-10-CM | POA: Diagnosis not present

## 2022-05-02 DIAGNOSIS — I5022 Chronic systolic (congestive) heart failure: Secondary | ICD-10-CM

## 2022-05-02 DIAGNOSIS — Z6841 Body Mass Index (BMI) 40.0 and over, adult: Secondary | ICD-10-CM | POA: Diagnosis not present

## 2022-05-02 DIAGNOSIS — Z7982 Long term (current) use of aspirin: Secondary | ICD-10-CM | POA: Insufficient documentation

## 2022-05-02 DIAGNOSIS — Z95810 Presence of automatic (implantable) cardiac defibrillator: Secondary | ICD-10-CM | POA: Diagnosis not present

## 2022-05-02 DIAGNOSIS — L98499 Non-pressure chronic ulcer of skin of other sites with unspecified severity: Secondary | ICD-10-CM

## 2022-05-02 DIAGNOSIS — I482 Chronic atrial fibrillation, unspecified: Secondary | ICD-10-CM

## 2022-05-02 DIAGNOSIS — Z8572 Personal history of non-Hodgkin lymphomas: Secondary | ICD-10-CM | POA: Diagnosis not present

## 2022-05-02 DIAGNOSIS — E785 Hyperlipidemia, unspecified: Secondary | ICD-10-CM | POA: Insufficient documentation

## 2022-05-02 DIAGNOSIS — J849 Interstitial pulmonary disease, unspecified: Secondary | ICD-10-CM

## 2022-05-02 DIAGNOSIS — E1151 Type 2 diabetes mellitus with diabetic peripheral angiopathy without gangrene: Secondary | ICD-10-CM | POA: Diagnosis not present

## 2022-05-02 DIAGNOSIS — Z9221 Personal history of antineoplastic chemotherapy: Secondary | ICD-10-CM | POA: Diagnosis not present

## 2022-05-02 DIAGNOSIS — Z7984 Long term (current) use of oral hypoglycemic drugs: Secondary | ICD-10-CM | POA: Insufficient documentation

## 2022-05-02 DIAGNOSIS — I739 Peripheral vascular disease, unspecified: Secondary | ICD-10-CM

## 2022-05-02 DIAGNOSIS — Z8616 Personal history of COVID-19: Secondary | ICD-10-CM | POA: Diagnosis not present

## 2022-05-02 DIAGNOSIS — Z8249 Family history of ischemic heart disease and other diseases of the circulatory system: Secondary | ICD-10-CM | POA: Insufficient documentation

## 2022-05-02 DIAGNOSIS — I110 Hypertensive heart disease with heart failure: Secondary | ICD-10-CM | POA: Diagnosis not present

## 2022-05-02 DIAGNOSIS — I10 Essential (primary) hypertension: Secondary | ICD-10-CM | POA: Diagnosis not present

## 2022-05-02 DIAGNOSIS — G4733 Obstructive sleep apnea (adult) (pediatric): Secondary | ICD-10-CM | POA: Diagnosis not present

## 2022-05-02 DIAGNOSIS — E11622 Type 2 diabetes mellitus with other skin ulcer: Secondary | ICD-10-CM

## 2022-05-02 LAB — BASIC METABOLIC PANEL
Anion gap: 12 (ref 5–15)
BUN: 56 mg/dL — ABNORMAL HIGH (ref 8–23)
CO2: 23 mmol/L (ref 22–32)
Calcium: 9.4 mg/dL (ref 8.9–10.3)
Chloride: 98 mmol/L (ref 98–111)
Creatinine, Ser: 1.49 mg/dL — ABNORMAL HIGH (ref 0.61–1.24)
GFR, Estimated: 51 mL/min — ABNORMAL LOW (ref >60)
Glucose, Bld: 106 mg/dL — ABNORMAL HIGH (ref 70–99)
Potassium: 5.5 mmol/L — ABNORMAL HIGH (ref 3.5–5.1)
Sodium: 133 mmol/L — ABNORMAL LOW (ref 135–145)

## 2022-05-02 LAB — BRAIN NATRIURETIC PEPTIDE: B Natriuretic Peptide: 586.5 pg/mL — ABNORMAL HIGH (ref 0.0–100.0)

## 2022-05-02 LAB — MAGNESIUM: Magnesium: 1.9 mg/dL (ref 1.7–2.4)

## 2022-05-02 MED ORDER — TORSEMIDE 10 MG PO TABS
10.0000 mg | ORAL_TABLET | Freq: Every day | ORAL | 6 refills | Status: DC
Start: 2022-05-02 — End: 2022-10-27

## 2022-05-02 MED ORDER — POTASSIUM CHLORIDE CRYS ER 10 MEQ PO TBCR: 10.0000 meq | EXTENDED_RELEASE_TABLET | Freq: Every day | ORAL | 6 refills | Status: DC

## 2022-05-02 NOTE — Telephone Encounter (Signed)
Patient aware of labs and medication changes. Sent my chart message with list of medication changes as well.  Labs ordered and scheduled.

## 2022-05-02 NOTE — Patient Instructions (Signed)
Decrease torsemide to 10 mg daily - updated Rx sent to local pharmacy.  Decrease potassium to 10 meq daily - updated Rx sent to local pharmacy. Labs today - will call you if abnormal. Return to see Dr. Gala Romney in 4 months. Please call us at [phone] in July to schedule this appointment.  Please call us at [phone] if any questions or issues prior to next visit.

## 2022-05-04 ENCOUNTER — Encounter (HOSPITAL_BASED_OUTPATIENT_CLINIC_OR_DEPARTMENT_OTHER): Payer: Medicare Other | Admitting: Physician Assistant

## 2022-05-04 DIAGNOSIS — I872 Venous insufficiency (chronic) (peripheral): Secondary | ICD-10-CM | POA: Diagnosis not present

## 2022-05-04 DIAGNOSIS — E11621 Type 2 diabetes mellitus with foot ulcer: Secondary | ICD-10-CM | POA: Diagnosis not present

## 2022-05-04 DIAGNOSIS — I87333 Chronic venous hypertension (idiopathic) with ulcer and inflammation of bilateral lower extremity: Secondary | ICD-10-CM | POA: Diagnosis not present

## 2022-05-04 DIAGNOSIS — L97828 Non-pressure chronic ulcer of other part of left lower leg with other specified severity: Secondary | ICD-10-CM | POA: Diagnosis not present

## 2022-05-04 DIAGNOSIS — L97818 Non-pressure chronic ulcer of other part of right lower leg with other specified severity: Secondary | ICD-10-CM | POA: Diagnosis not present

## 2022-05-04 DIAGNOSIS — E1151 Type 2 diabetes mellitus with diabetic peripheral angiopathy without gangrene: Secondary | ICD-10-CM | POA: Diagnosis not present

## 2022-05-04 DIAGNOSIS — I2510 Atherosclerotic heart disease of native coronary artery without angina pectoris: Secondary | ICD-10-CM | POA: Diagnosis not present

## 2022-05-04 DIAGNOSIS — L97518 Non-pressure chronic ulcer of other part of right foot with other specified severity: Secondary | ICD-10-CM | POA: Diagnosis not present

## 2022-05-04 DIAGNOSIS — I890 Lymphedema, not elsewhere classified: Secondary | ICD-10-CM | POA: Diagnosis not present

## 2022-05-04 DIAGNOSIS — L97312 Non-pressure chronic ulcer of right ankle with fat layer exposed: Secondary | ICD-10-CM | POA: Diagnosis not present

## 2022-05-04 DIAGNOSIS — I509 Heart failure, unspecified: Secondary | ICD-10-CM | POA: Diagnosis not present

## 2022-05-04 DIAGNOSIS — Z6841 Body Mass Index (BMI) 40.0 and over, adult: Secondary | ICD-10-CM | POA: Diagnosis not present

## 2022-05-04 DIAGNOSIS — I110 Hypertensive heart disease with heart failure: Secondary | ICD-10-CM | POA: Diagnosis not present

## 2022-05-05 NOTE — Progress Notes (Addendum)
ABSHIR, PAOLINI ([id number]) [id number] Page 1 of 11 Visit Report for 05/04/2022 Chief Complaint Document Details Patient Name: Date of Service: [name], [name] 05/04/2022 3:00 PM Medical Record Number: [id number] Patient Account Number: [id number] Date of Birth/Sex: Treating RN: 08/28/1955 (67 y.o. M) Primary Care Provider: [name] Other Clinician: Referring Provider: Treating Provider/Extender: Alver Fisher in Treatment: 39 Information Obtained from: Patient Chief Complaint 08/04/2021; patient returns to clinic with bilateral leg wounds as well as areas on the right foot Electronic Signature(s) Signed: 05/04/2022 3:08:09 PM By: Allen Derry PA-C Entered By: Allen Derry on 05/04/2022 15:08:09 -------------------------------------------------------------------------------- Debridement Details Patient Name: Date of Service: [name], [name] 05/04/2022 3:00 PM Medical Record Number: [id number] Patient Account Number: [id number] Date of Birth/Sex: Treating RN: May 29, 1955 (67 y.o. Harlon Flor, Millard.Loa Primary Care Provider: [name] Other Clinician: Referring Provider: Treating Provider/Extender: Alver Fisher in Treatment: 39 Debridement Performed for Assessment: Wound #7 Right,Medial Malleolus Performed By: Physician Lenda Kelp, PA Debridement Type: Debridement Severity of Tissue Pre Debridement: Fat layer exposed Level of Consciousness (Pre-procedure): Awake and Alert Pre-procedure Verification/Time Out Yes - 15:30 Taken: Start Time: 15:31 Pain Control: Lidocaine 5% topical ointment Percent of Wound Bed Debrided: 100% T Area Debrided (cm): otal 13.74 Tissue and other material debrided: Viable, Non-Viable, Callus, Slough, Subcutaneous, Skin: Dermis , Skin: Epidermis, Biofilm, Slough Level: Skin/Subcutaneous Tissue Debridement Description: Excisional Instrument: Curette Bleeding: Minimum Hemostasis  Achieved: Pressure End Time: 15:37 Procedural Pain: 0 Post Procedural Pain: 0 Response to Treatment: Procedure was tolerated well Level of Consciousness (Post- Awake and Alert procedure): Post Debridement Measurements of Total Wound Length: (cm) 5 Width: (cm) 3.5 Depth: (cm) 0.5 Volume: (cm) 6.872 Character of Wound/Ulcer Post Debridement: Improved Severity of Tissue Post Debridement: Fat layer exposed Post Procedure Diagnosis Same as Pre-procedure Electronic Signature(s) Signed: 05/04/2022 5:22:52 PM By: Allen Derry PA-C Signed: 05/05/2022 4:51:53 PM By: Shawn Stall RN, BSN [place], Dannielle Huh [name] ([id number]) [id number] Page 2 of 11 Entered By: Shawn Stall on 05/04/2022 15:38:10 -------------------------------------------------------------------------------- HPI Details Patient Name: Date of Service: [name], [name]. 05/04/2022 3:00 PM Medical Record Number: [id number] Patient Account Number: [id number] Date of Birth/Sex: Treating RN: [birth year] (67 y.o. M) Primary Care Provider: [name] Other Clinician: Referring Provider: Treating Provider/Extender: Alver Fisher in Treatment: 39 History of Present Illness HPI Description: ADMISSION 03/22/2021 This is a 67 year old man with a past medical history significant for diabetes type 2, congestive heart failure, peripheral arterial disease, morbid obesity, venous insufficiency, and coronary artery disease. He has been followed by Dr. Loreta Ave in podiatry, who performed a transmetatarsal amputation on the left foot in August 2022. He had issues healing that wound, but based upon Dr. Kenna Gilbert notes, ultimately the TMA wound healed. During his recovery from that surgery, however, ulcers opened up over the DIP joint of the right second and third toe. These have apparently closed and reopened multiple times. It sounds like one of the issues has been moisture accumulation and maceration of the  tissues causing them to reopen. At his last visit with Dr. Loreta Ave, on March 01, 2021, there continues to be problems with moisture and he was referred to wound care for further evaluation and management. He had a formal aortogram with runoff performed prior to his TMA. The findings are copied here: Patient has inline flow to both feet with no significant flow-limiting lesion that would be amenable to percutaneous or open revascularization. He does have an element  of small vessel disease and has a short segment occlusion of the distal anterior tibial/dorsalis pedis artery on the left foot but does have posterior tibial artery flow. Would recommend management of wounds with amputation of toes 2 and 3 on the right foot if the wounds do not heal and deteriorate. Transmetatarsal amputation on the left side has as good a blood supply as it is going to get and hopefully this will heal in the future. Formal ABIs were done in January 2023. They are normal bilaterally. ABI Findings: +---------+------------------+-----+----------+--------+ Right Rt Pressure (mmHg)IndexWaveform Comment  +---------+------------------+-----+----------+--------+ Brachial 160     +---------+------------------+-----+----------+--------+ PTA 192 1.06 monophasic  +---------+------------------+-----+----------+--------+ DP 159 0.88 monophasic  +---------+------------------+-----+----------+--------+ Great T oe145 0.80    +---------+------------------+-----+----------+--------+ +--------+------------------+-----+---------+-------+ Left Lt Pressure (mmHg)IndexWaveform Comment +--------+------------------+-----+---------+-------+ [id number]     +--------+------------------+-----+---------+-------+ PTA 204 1.13 triphasic  +--------+------------------+-----+---------+-------+ DP 194 1.07 biphasic    +--------+------------------+-----+---------+-------+ +-------+-----------+-----------+------------+------------+ ABI/TBIT oday's ABIT oday's TBIPrevious ABIPrevious TBI +-------+-----------+-----------+------------+------------+ Right 1.06 0.8 1.26 0.65  +-------+-----------+-----------+------------+------------+ Left 1.13 amputation 1.15 amputation  +-------+-----------+-----------+------------+------------+ Previous ABI on 08/06/20 at [facility] Pedal pressures falsely elevated due to medial calcification. Summary: Right: Resting right ankle-brachial index is within normal range. The right toe-brachial index is normal. Left: Resting left ankle-brachial index is within normal range. READMISSION 08/04/2021 Mr. Tome is now a 66 year old man who I remember from this clinic many years ago I think he had a right lower extremity predominantly venous wound at the time. He was here for 1 visit in March of this year had wounds on his right second and third toes we apparently dressed them many and they healed so he did not come back. He is listed in [place] is being a diabetic although the patient denies this says he is verified it with his primary doctor. In any case over the last several weeks or so according the patient although these wounds look somewhat more chronic than that he has developed predominantly large wounds on the right medial and right lateral ankle smaller areas on the left leg and areas on the dorsal aspect of his right second and third toes. Its not clear how he has been dressing these. 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 [name], [name] ([id number]) [id number] Page 3 of 11 The patient has known PAD. He had an angiogram in August 2022 at which time he had nonhealing wounds in both feet. On the left his major vessels in the thigh were all patent. He  had three-vessel patent to the level of the ankle. He had a very short occlusion in the left anterior tibial. On the right lower extremity the major vessels in his thigh were all patent. He had three-vessel runoff to the foot sluggish filling of the anterior tibial artery. He was felt to have some component of small vessel disease but nothing that was amenable or needed revascularization. It was recommended that he have amputation of the second and third toes on the right foot if they did not heal He has been following with Dr. Loreta Ave of podiatry. Dr. Loreta Ave got him juxta lite stockings although I do not think he had them on properly he has uncontrolled edema in both legs Past medical history includes type 2 diabetes [although the patient really denies this], left TMA in 2022,lower extremity wounds in fact attendance at this clinic in 2009-2010, A-fib on Eliquis, chronic venous insufficiency with secondary lymphedema history of non-Hodgkin's lymphoma. 08-11-2021 upon  evaluation today patient presents for follow-up evaluation he was seen last Wednesday initially for inspection here in our clinic. With that being said he tells me that he unfortunately has been having a lot of drainage and is actually coming through his wrap. Fortunately I do not see any evidence of active infection locally or systemically at this time which is great news. No fevers, chills, nausea, vomiting, or diarrhea. With that being said there does appear to be some evidence of local infection based on what I am seeing today. 08-18-2021 upon evaluation today patient appears to be doing okay currently in regard to his wounds with that being said that he is doing much better but still has a long ways to go to get to where he wanted to be. I think the infection is significantly improved. He has another week of the antibiotic at this point. 08-25-2021 upon evaluation today patient's wounds are actually doing decently well he has erythema has  significantly improved. I think the cellulitis is under controlled I am going to continue him on 2 more weeks of the Levaquin at 500 mg this is a lower dose but I am hoping it will be better for him. 09-08-2021 upon evaluation today patient appears to be doing excellent in regard to his wounds. Since I last saw him he was actually in the hospital where he ended up having a pacemaker put in. Subsequently he tells me that he is actually doing quite well although they were unsure whether they were going to do it due to the fact that he had the wounds on his legs. And then I am glad they did anything seem to be doing well. 09-15-2021 upon evaluation patient's wounds are actually showing signs of improvement. The right side wounds do appear to have some need for sharp debridement today and I Ernie Hew go ahead and proceed with that. I think that if we get the wounds cleaned up he will actually show signs of continued improvement. I am also leaning towards switching to [facility] which I think will be a much better option for him. 09-22-2021 patient's wounds are showing signs of excellent improvement. I am actually extremely pleased with where we stand and I think that the patient is making great progress. There does not appear to be any signs of active infection. 09-29-2021 upon evaluation today patient appears to be doing excellent in regard to his wounds. He is actually tolerating the dressing changes without complication. Fortunately I see no evidence of active infection locally or systemically at this time which is great news and overall I am extremely pleased with where we stand currently. [id number] upon evaluation today patient actually appears to be doing excellent in regard to his wounds. The left leg is almost completely healed the right ankle is significantly smaller. Overall I am extremely pleased with where we stand at this point. I do not see any evidence of active infection at this time. 10-13-2021  upon evaluation today patient appears to be doing excellent in regard to his wounds. I really feel like he is making good progress here and I am very pleased in that regard. Fortunately I do not see any signs of active infection at this time. We are using the [facility] topical antibiotic therapy. 10-20-2021 upon evaluation today patient appears to be doing well with regard to his wound on the right medial ankle region the left leg is almost completely healed. I am actually very pleased with where we stand today. 11-03-2021 upon  evaluation today patient's wound actually is going require some sharp debridement but appears to be doing much better which is great news. Fortunately I do not see any signs of active infection at this time. 11-10-2021 upon evaluation today patient's wound is actually showing signs of excellent improvement. Fortunately I do not see any evidence of infection locally or systemically which is great news and overall I am extremely pleased with where we stand today. I do believe he is making good progress he does have his [facility] topical antibiotics with him here today. 11-24-2021 upon evaluation today patient actually showing signs of excellent improvement this appears to be doing much better. Fortunately I do not see any evidence of infection locally or systemically at this time. 12-01-2021 upon evaluation today patient appears to be doing well currently in regard to his wound. He has been tolerating the dressing changes without complication. Fortunately I do not see any evidence of active infection at this time which is great news and overall I am extremely pleased with where we stand today. 12-15-2021 upon evaluation today patient appears to be doing well with regard to his wound. He is showing signs of improvement is slow but nonetheless we are making improvements here. Fortunately I do not see any evidence of active infection locally nor systemically at this time. We are still  using the [facility] topical antibiotic over the open area only. 12-29-2021 upon evaluation today patient's wound actually is showing signs of excellent improvement. It has been 2 weeks since I perform any debridement and it definitely shows he has some tissue that needs to be cleaned away but I think we can do so quite easily and readily today. The good news is I do not see any signs of infection I think he is doing much better in that regard. Overall I am extremely pleased with where we stand. 01-12-2022 upon evaluation today patient appears to be doing well currently in regard to his wound although it is not getting significantly smaller it is also not getting any larger. Fortunately I do not see any evidence of infection locally nor systemically which is great news and overall I am extremely pleased with where things stand currently. 01-19-2022 upon evaluation today patient appears to be doing well currently in regard to his wound. The PolyMem actually seems to have done extremely well for him. Fortunately I do not see any signs of infection locally nor systemically at this time. 01-26-2022 upon evaluation today patient appears to be making progress. Fortunately there does not appear to be any signs of infection which is great news. No fevers, chills, nausea, vomiting, or diarrhea. With that being said this is very slow to heal and although it is smaller I still feel like we may want to try to do something to speed this up I think that a skin substitute could be beneficial, look into Kerecis. 02-02-2022 upon evaluation today patient's wound is actually showing signs of excellent improvement. I do not see any evidence of infection and overall I think that we are headed in the right direction. Fortunately I think that he is tolerating the dressing changes without complication. 02-09-2022 upon evaluation today patient appears to be doing well currently in regard to his wound. It does look a little bit  macerated but fortunately does not appear to be showing any signs of significant skin breakdown over the macerated area which is good news. Fortunately I do not see any evidence of active infection locally nor systemically at this  point which is great news. 02-23-2022 upon evaluation today patient appears to be doing well currently in regard to his wounds. In fact his area on the ankle is actually showing signs of healing quite nicely. We do have the Apligraf ready today I am hopeful this is going to speed things up and get this closed much more effectively and quickly. Fortunately I do not see any signs of active infection locally nor systemically at this time which is great news. [name], [name] ([id number]) [id number] Page 4 of 11 03-02-2022 upon evaluation today patient appears to be doing excellent in regard to his wound. He is actually been tolerating the dressing changes without complication the wound on the left medial lower extremity is doing quite well and Apligraf seems to have been extremely beneficial for him. I am extremely pleased with where we stand today. 03/16/2022: The wound measurements are smaller today. He has accumulation of a yellow crust around the edges and slough on the surface. There is a musty odor coming from the wound. He has been getting Apligraf. 03-30-2022 upon evaluation today patient appears to be making progress. In regard to his leg ulcer. This is slow but nonetheless the Apligraf has fed things up. Fortunately I do not see any evidence of infection locally nor systemically at this time. No fevers, chills, nausea, vomiting, or diarrhea. Patient is here for Apligraf #4 today. 04-13-2022 upon evaluation today patient appears to be doing a little bit more poorly in regard to his wound. He actually feels like his wrap may have been a little bit tight. Fortunately there does not appear to be any signs of active infection locally nor systemically  which is great news and I am pleased in that regard. 04-20-2022 upon evaluation today patient appears to be doing better in regard to his wound from the standpoint of swelling he is actually lost 33 pounds on torsemide in the past week his leg is significantly smaller compared to what it has been. Fortunately I do not see any signs of active infection locally nor systemically at this time. 04-27-2022 upon evaluation today patient's wound actually is showing signs of erythema and warmth around the edges of the wound I am actually concerned about the possibility of infection. I think we probably need to obtain a wound culture and also can recommend based on what we are seeing that we go ahead and have the patient continue to utilize the compression wrapping which I think has been of benefit. 05-04-2022 upon evaluation today patient appears to be doing well currently in regard to his wound. He has been tolerating the dressing changes without complication. Fortunately there does not appear to be any signs of active infection locally or systemically which is great news. Electronic Signature(s) Signed: 05/04/2022 4:42:59 PM By: Allen Derry PA-C Entered By: Allen Derry on 05/04/2022 16:42:59 -------------------------------------------------------------------------------- Physical Exam Details Patient Name: Date of Service: [name], [name] 05/04/2022 3:00 PM Medical Record Number: [id number] Patient Account Number: [id number] Date of Birth/Sex: Treating RN: [birth year] (67 y.o. M) Primary Care Provider: [name] Other Clinician: Referring Provider: Treating Provider/Extender: Alver Fisher in Treatment: 84 Constitutional Well-nourished and well-hydrated in no acute distress. Respiratory normal breathing without difficulty. Psychiatric this patient is able to make decisions and demonstrates good insight into disease process. Alert and Oriented x 3. pleasant and  cooperative. Notes Upon inspection patient's wound bed actually showed signs of good granulation epithelization at this point. Fortunately there does not appear to  be any signs of active infection which is great news and I do believe that with movement in the right direction. I do think that he appears to have had an infection though I think were seen in definite improvements. I think Levaquin is done a good job here. Electronic Signature(s) Signed: 05/04/2022 4:43:28 PM By: Allen Derry PA-C Entered By: Allen Derry on 05/04/2022 16:43:28 -------------------------------------------------------------------------------- Physician Orders Details Patient Name: Date of Service: [name], [name] 05/04/2022 3:00 PM Medical Record Number: [id number] Patient Account Number: [id number] Date of Birth/Sex: Treating RN: 1955-08-29 (67 y.o. [name] Primary Care Provider: [name] Other Clinician: Referring Provider: Treating Provider/Extender: Alver Fisher in Treatment: 58 Verbal / Phone Orders: No Diagnosis Coding ICD-10 Coding Code Description I89.0 Lymphedema, not elsewhere classified [name], [name] ([id number]) [id number] Page 5 of 11 [phone] Chronic venous hypertension (idiopathic) with ulcer and inflammation of bilateral lower extremity L97.828 Non-pressure chronic ulcer of other part of left lower leg with other specified severity L97.818 Non-pressure chronic ulcer of other part of right lower leg with other specified severity E11.621 Type 2 diabetes mellitus with foot ulcer L97.518 Non-pressure chronic ulcer of other part of right foot with other specified severity Follow-up Appointments ppointment in 1 week. Leonard Schwartz Wednesday 05/11/2022 1500 room 8 Return A ppointment in 2 weeks. Leonard Schwartz Wednesday 05/18/2022 1500 room 8 Return A Anesthetic (In clinic) Topical Lidocaine 5% applied to wound bed Cellular or Tissue Based  Products Cellular or Tissue Based Product Type: - Run IVR for Kerrecis- denied #1 Apligraf applied 02/23/2022 #2 Apligraf applied 03/02/2022 #3 Apligraf applied 03/16/2022 #4 Apligraf applied 03/30/2022 HOLD APLIGRAF this week. Cellular or Tissue Based Product applied to wound bed, secured with steri-strips, cover with Adaptic or Mepitel. (DO NOT REMOVE). Bathing/ Shower/ Hygiene May shower with protection but do not get wound dressing(s) wet. Protect dressing(s) with water repellant cover (for example, large plastic bag) or a cast cover and may then take shower. Edema Control - Lymphedema / SCD / Other Elevate legs to the level of the heart or above for 30 minutes daily and/or when sitting for 3-4 times a day throughout the day. Avoid standing for long periods of time. Exercise regularly Moisturize legs daily. - apply every night before bed to left leg. Compression stocking or Garment 30-40 mm/Hg pressure to: - wear your VIVE compression stocking to left leg. apply in the morning and remove at night. Will apply tubigrip size [name] to left leg. Remove once you get home and use your compression stockings. Wound Treatment Wound #7 - Malleolus Wound Laterality: Right, Medial Peri-Wound Care: Triamcinolone 15 (g) 1 x Per Week/30 Days Discharge Instructions: Use triamcinolone 15 (g) as directed Peri-Wound Care: Zinc Oxide Ointment 30g tube 1 x Per Week/30 Days Discharge Instructions: Apply Zinc Oxide to periwound with each dressing change Peri-Wound Care: Sween Lotion (Moisturizing lotion) 1 x Per Week/30 Days Discharge Instructions: Apply moisturizing lotion as directed Prim Dressing: UrgoClean AG 4 X 5 (in/in) 1 x Per Week/30 Days ary Discharge Instructions: Apply directly to wound bed. Secondary Dressing: Optifoam Non-Adhesive Dressing, 4x4 in 1 x Per Week/30 Days Discharge Instructions: Apply medial and anterior lower leg with compression wrap. Secondary Dressing: Woven Gauze Sponge, Non-Sterile  4x4 in 1 x Per Week/30 Days Discharge Instructions: Apply over primary dressing as directed. Secondary Dressing: Zetuvit Plus 4x8 in 1 x Per Week/30 Days Discharge Instructions: Apply over primary dressing as directed. Compression Wrap: Urgo K2 Lite, two layer compression  system, regular 1 x Per Week/30 Days Discharge Instructions: Apply Urgo K2 Lite as directed (alternative to 3 layer compression). apply unna boot first layer to upper portion of lower leg to secure wrap in place. Electronic Signature(s) Signed: 05/04/2022 5:22:52 PM By: Allen Derry PA-C Signed: 05/05/2022 4:51:53 PM By: Shawn Stall RN, BSN Entered By: Shawn Stall on 05/04/2022 15:36:18 -------------------------------------------------------------------------------- Problem List Details Patient Name: Date of Service: [name], [name] 05/04/2022 3:00 PM Medical Record Number: [id number] Patient Account Number: [id number] Date of Birth/Sex: Treating RN: 25-Aug-1955 (67 y.o. M) Primary Care Provider: [name] Other Clinician: Referring Provider: Treating Provider/Extender: Alric Quan [place], [place] [name] ([id number]) [phone] Page 6 of 11 Weeks in Treatment: 39 Active Problems ICD-10 Encounter Code Description Active Date MDM Diagnosis I89.0 Lymphedema, not elsewhere classified 08/04/2021 No Yes I87.333 Chronic venous hypertension (idiopathic) with ulcer and inflammation of 08/04/2021 No Yes bilateral lower extremity L97.828 Non-pressure chronic ulcer of other part of left lower leg with other specified 08/04/2021 No Yes severity L97.818 Non-pressure chronic ulcer of other part of right lower leg with other specified 08/04/2021 No Yes severity E11.621 Type 2 diabetes mellitus with foot ulcer 08/04/2021 No Yes L97.518 Non-pressure chronic ulcer of other part of right foot with other specified 08/04/2021 No Yes severity Inactive Problems Resolved Problems Electronic  Signature(s) Signed: 05/04/2022 3:07:55 PM By: Allen Derry PA-C Entered By: Allen Derry on 05/04/2022 15:07:55 -------------------------------------------------------------------------------- Progress Note Details Patient Name: Date of Service: [name], [name] 05/04/2022 3:00 PM Medical Record Number: [id number] Patient Account Number: [id number] Date of Birth/Sex: Treating RN: [birth year] (67 y.o. M) Primary Care Provider: [name] Other Clinician: Referring Provider: Treating Provider/Extender: Alver Fisher in Treatment: 39 Subjective Chief Complaint Information obtained from Patient 08/04/2021; patient returns to clinic with bilateral leg wounds as well as areas on the right foot History of Present Illness (HPI) ADMISSION 03/22/2021 This is a 67 year old man with a past medical history significant for diabetes type 2, congestive heart failure, peripheral arterial disease, morbid obesity, venous insufficiency, and coronary artery disease. He has been followed by Dr. Loreta Ave in podiatry, who performed a transmetatarsal amputation on the left foot in August 2022. He had issues healing that wound, but based upon Dr. Kenna Gilbert notes, ultimately the TMA wound healed. During his recovery from that surgery, however, ulcers opened up over the DIP joint of the right second and third toe. These have apparently closed and reopened multiple times. It sounds like one of the issues has been moisture accumulation and maceration of the tissues causing them to reopen. At his last visit with Dr. Loreta Ave, on March 01, 2021, there continues to be problems with moisture and he was referred to wound care for further evaluation and management. He had a formal aortogram with runoff performed prior to his TMA. The findings are copied here: Patient has inline flow to both feet with no significant flow-limiting lesion that would be amenable to percutaneous or open revascularization. He does  have an element of small vessel disease and has a short segment occlusion of the distal anterior tibial/dorsalis pedis artery on the left foot but does have posterior [name], [name] ([id number]) [id number] Page 7 of 11 tibial artery flow. Would recommend management of wounds with amputation of toes 2 and 3 on the right foot if the wounds do not heal and deteriorate. Transmetatarsal amputation on the left side has as good a blood supply as it is going to get and hopefully this  will heal in the future. Formal ABIs were done in January 2023. They are normal bilaterally. ABI Findings: +---------+------------------+-----+----------+--------+ Right Rt Pressure (mmHg)IndexWaveform Comment  +---------+------------------+-----+----------+--------+ Brachial 160    +---------+------------------+-----+----------+--------+ PTA 192 1.06 monophasic  +---------+------------------+-----+----------+--------+ DP 159 0.88 monophasic  +---------+------------------+-----+----------+--------+ Great T oe145 0.80    +---------+------------------+-----+----------+--------+ +--------+------------------+-----+---------+-------+ Left Lt Pressure (mmHg)IndexWaveform Comment +--------+------------------+-----+---------+-------+ [id number]    +--------+------------------+-----+---------+-------+ PTA 204 1.13 triphasic  +--------+------------------+-----+---------+-------+ DP 194 1.07 biphasic   +--------+------------------+-----+---------+-------+ +-------+-----------+-----------+------------+------------+ ABI/TBIT oday's ABIT oday's TBIPrevious ABIPrevious TBI +-------+-----------+-----------+------------+------------+ Right 1.06 0.8 1.26 0.65  +-------+-----------+-----------+------------+------------+ Left 1.13 amputation 1.15 amputation  +-------+-----------+-----------+------------+------------+ Previous ABI on  08/06/20 at [facility] Pedal pressures falsely elevated due to medial calcification. Summary: Right: Resting right ankle-brachial index is within normal range. The right toe-brachial index is normal. Left: Resting left ankle-brachial index is within normal range. READMISSION 08/04/2021 Mr. Shorey is now a 67 year old man who I remember from this clinic many years ago I think he had a right lower extremity predominantly venous wound at the time. He was here for 1 visit in March of this year had wounds on his right second and third toes we apparently dressed them many and they healed so he did not come back. He is listed in [place] is being a diabetic although the patient denies this says he is verified it with his primary doctor. In any case over the last several weeks or so according the patient although these wounds look somewhat more chronic than that he has developed predominantly large wounds on the right medial and right lateral ankle smaller areas on the left leg and areas on the dorsal aspect of his right second and third toes. Its not clear how he has been dressing these. 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 The patient has known PAD. He had an angiogram in August 2022 at which time he had nonhealing wounds in both feet. On the left his major vessels in the thigh were all patent. He had three-vessel patent to the level of the ankle. He had a very short occlusion in the left anterior tibial. On the right lower extremity the major vessels in his thigh were all patent. He had three-vessel runoff to the foot sluggish filling of the anterior tibial artery. He was felt to have some component of small vessel disease but nothing that was amenable or needed revascularization. It was recommended that he have amputation of the second and third toes on the right foot if they did not heal He has been following with Dr.  Loreta Ave of podiatry. Dr. Loreta Ave got him juxta lite stockings although I do not think he had them on properly he has uncontrolled edema in both legs Past medical history includes type 2 diabetes [although the patient really denies this], left TMA in 2022,lower extremity wounds in fact attendance at this clinic in 2009-2010, A-fib on Eliquis, chronic venous insufficiency with secondary lymphedema history of non-Hodgkin's lymphoma. 08-11-2021 upon evaluation today patient presents for follow-up evaluation he was seen last Wednesday initially for inspection here in our clinic. With that being said he tells me that he unfortunately has been having a lot of drainage and is actually coming through his wrap. Fortunately I do not see any evidence of active infection locally or systemically at this time which is great news. No fevers, chills, nausea, vomiting, or diarrhea. With that being said there does appear to be some evidence of local infection  based on what I am seeing today. 08-18-2021 upon evaluation today patient appears to be doing okay currently in regard to his wounds with that being said that he is doing much better but still has a long ways to go to get to where he wanted to be. I think the infection is significantly improved. He has another week of the antibiotic at this point. 08-25-2021 upon evaluation today patient's wounds are actually doing decently well he has erythema has significantly improved. I think the cellulitis is under controlled I am going to continue him on 2 more weeks of the Levaquin at 500 mg this is a lower dose but I am hoping it will be better for him. 09-08-2021 upon evaluation today patient appears to be doing excellent in regard to his wounds. Since I last saw him he was actually in the hospital where he ended up having a pacemaker put in. Subsequently he tells me that he is actually doing quite well although they were unsure whether they were going to do it due to the fact that  he had the wounds on his legs. And then I am glad they did anything seem to be doing well. 09-15-2021 upon evaluation patient's wounds are actually showing signs of improvement. The right side wounds do appear to have some need for sharp debridement today and I Ernie Hew go ahead and proceed with that. I think that if we get the wounds cleaned up he will actually show signs of continued improvement. I am also leaning towards switching to [facility] which I think will be a much better option for him. 09-22-2021 patient's wounds are showing signs of excellent improvement. I am actually extremely pleased with where we stand and I think that the patient is making great progress. There does not appear to be any signs of active infection. [name], [name] ([id number]) [id number] Page 8 of 11 09-29-2021 upon evaluation today patient appears to be doing excellent in regard to his wounds. He is actually tolerating the dressing changes without complication. Fortunately I see no evidence of active infection locally or systemically at this time which is great news and overall I am extremely pleased with where we stand currently. 01-9145 upon evaluation today patient actually appears to be doing excellent in regard to his wounds. The left leg is almost completely healed the right ankle is significantly smaller. Overall I am extremely pleased with where we stand at this point. I do not see any evidence of active infection at this time. 10-13-2021 upon evaluation today patient appears to be doing excellent in regard to his wounds. I really feel like he is making good progress here and I am very pleased in that regard. Fortunately I do not see any signs of active infection at this time. We are using the [facility] topical antibiotic therapy. 10-20-2021 upon evaluation today patient appears to be doing well with regard to his wound on the right medial ankle region the left leg is almost  completely healed. I am actually very pleased with where we stand today. 11-03-2021 upon evaluation today patient's wound actually is going require some sharp debridement but appears to be doing much better which is great news. Fortunately I do not see any signs of active infection at this time. 11-10-2021 upon evaluation today patient's wound is actually showing signs of excellent improvement. Fortunately I do not see any evidence of infection locally or systemically which is great news and overall I am extremely pleased with where we stand today.  I do believe he is making good progress he does have his [facility] topical antibiotics with him here today. 11-24-2021 upon evaluation today patient actually showing signs of excellent improvement this appears to be doing much better. Fortunately I do not see any evidence of infection locally or systemically at this time. 12-01-2021 upon evaluation today patient appears to be doing well currently in regard to his wound. He has been tolerating the dressing changes without complication. Fortunately I do not see any evidence of active infection at this time which is great news and overall I am extremely pleased with where we stand today. 12-15-2021 upon evaluation today patient appears to be doing well with regard to his wound. He is showing signs of improvement is slow but nonetheless we are making improvements here. Fortunately I do not see any evidence of active infection locally nor systemically at this time. We are still using the [facility] topical antibiotic over the open area only. 12-29-2021 upon evaluation today patient's wound actually is showing signs of excellent improvement. It has been 2 weeks since I perform any debridement and it definitely shows he has some tissue that needs to be cleaned away but I think we can do so quite easily and readily today. The good news is I do not see any signs of infection I think he is doing much better in that  regard. Overall I am extremely pleased with where we stand. 01-12-2022 upon evaluation today patient appears to be doing well currently in regard to his wound although it is not getting significantly smaller it is also not getting any larger. Fortunately I do not see any evidence of infection locally nor systemically which is great news and overall I am extremely pleased with where things stand currently. 01-19-2022 upon evaluation today patient appears to be doing well currently in regard to his wound. The PolyMem actually seems to have done extremely well for him. Fortunately I do not see any signs of infection locally nor systemically at this time. 01-26-2022 upon evaluation today patient appears to be making progress. Fortunately there does not appear to be any signs of infection which is great news. No fevers, chills, nausea, vomiting, or diarrhea. With that being said this is very slow to heal and although it is smaller I still feel like we may want to try to do something to speed this up I think that a skin substitute could be beneficial, look into Kerecis. 02-02-2022 upon evaluation today patient's wound is actually showing signs of excellent improvement. I do not see any evidence of infection and overall I think that we are headed in the right direction. Fortunately I think that he is tolerating the dressing changes without complication. 02-09-2022 upon evaluation today patient appears to be doing well currently in regard to his wound. It does look a little bit macerated but fortunately does not appear to be showing any signs of significant skin breakdown over the macerated area which is good news. Fortunately I do not see any evidence of active infection locally nor systemically at this point which is great news. 02-23-2022 upon evaluation today patient appears to be doing well currently in regard to his wounds. In fact his area on the ankle is actually showing signs of healing quite nicely. We do  have the Apligraf ready today I am hopeful this is going to speed things up and get this closed much more effectively and quickly. Fortunately I do not see any signs of active infection locally nor systemically  at this time which is great news. 03-02-2022 upon evaluation today patient appears to be doing excellent in regard to his wound. He is actually been tolerating the dressing changes without complication the wound on the left medial lower extremity is doing quite well and Apligraf seems to have been extremely beneficial for him. I am extremely pleased with where we stand today. 03/16/2022: The wound measurements are smaller today. He has accumulation of a yellow crust around the edges and slough on the surface. There is a musty odor coming from the wound. He has been getting Apligraf. 03-30-2022 upon evaluation today patient appears to be making progress. In regard to his leg ulcer. This is slow but nonetheless the Apligraf has fed things up. Fortunately I do not see any evidence of infection locally nor systemically at this time. No fevers, chills, nausea, vomiting, or diarrhea. Patient is here for Apligraf #4 today. 04-13-2022 upon evaluation today patient appears to be doing a little bit more poorly in regard to his wound. He actually feels like his wrap may have been a little bit tight. Fortunately there does not appear to be any signs of active infection locally nor systemically which is great news and I am pleased in that regard. 04-20-2022 upon evaluation today patient appears to be doing better in regard to his wound from the standpoint of swelling he is actually lost 33 pounds on torsemide in the past week his leg is significantly smaller compared to what it has been. Fortunately I do not see any signs of active infection locally nor systemically at this time. 04-27-2022 upon evaluation today patient's wound actually is showing signs of erythema and warmth around the edges of the wound I am  actually concerned about the possibility of infection. I think we probably need to obtain a wound culture and also can recommend based on what we are seeing that we go ahead and have the patient continue to utilize the compression wrapping which I think has been of benefit. 05-04-2022 upon evaluation today patient appears to be doing well currently in regard to his wound. He has been tolerating the dressing changes without complication. Fortunately there does not appear to be any signs of active infection locally or systemically which is great news. [name], [name] ([id number]) [id number] Page 9 of 11 Constitutional Well-nourished and well-hydrated in no acute distress. Vitals Time Taken: 3:20 PM, Height: 71 in, Weight: 350 lbs, BMI: 48.8, Temperature: 98.3 F, Pulse: 77 bpm, Respiratory Rate: 18 breaths/min, Blood Pressure: 133/83 mmHg. Respiratory normal breathing without difficulty. Psychiatric this patient is able to make decisions and demonstrates good insight into disease process. Alert and Oriented x 3. pleasant and cooperative. General Notes: Upon inspection patient's wound bed actually showed signs of good granulation epithelization at this point. Fortunately there does not appear to be any signs of active infection which is great news and I do believe that with movement in the right direction. I do think that he appears to have had an infection though I think were seen in definite improvements. I think Levaquin is done a good job here. Integumentary (Hair, Skin) Wound #7 status is Open. Original cause of wound was Blister. The date acquired was: 07/21/2021. The wound has been in treatment 39 weeks. The wound is located on the Right,Medial Malleolus. The wound measures 4.6cm length x 2.1cm width x 0.5cm depth; 7.587cm^2 area and 3.793cm^3 volume. There is no tunneling or undermining noted. There is a medium amount of serosanguineous drainage noted.  There is large (67-100%) granulation within the wound bed. There is a small (1-33%) amount of necrotic tissue within the wound bed including Adherent Slough. The periwound skin appearance exhibited: Callus. 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 There was a Excisional Skin/Subcutaneous Tissue Debridement with a total area of 13.74 sq cm performed by Lenda Kelp, PA. With the following instrument(s): Curette to remove Viable and Non-Viable tissue/material. Material removed includes Callus, Subcutaneous Tissue, Slough, Skin: Dermis, Skin: Epidermis, and Biofilm after achieving pain control using Lidocaine 5% topical ointment. A time out was conducted at 15:30, prior to the start of the procedure. A Minimum amount of bleeding was controlled with Pressure. The procedure was tolerated well with a pain level of 0 throughout and a pain level of 0 following the procedure. Post Debridement Measurements: 5cm length x 3.5cm width x 0.5cm depth; 6.872cm^3 volume. Character of Wound/Ulcer Post Debridement is improved. Severity of Tissue Post Debridement is: Fat layer exposed. Post procedure Diagnosis Wound #7: Same as Pre-Procedure Pre-procedure diagnosis of Wound #7 is a Venous Leg Ulcer located on the Right,Medial Malleolus . There was a Double Layer Compression Therapy Procedure by Thayer Dallas. Post procedure Diagnosis Wound #7: Same as Pre-Procedure Plan Follow-up Appointments: Return Appointment in 1 week. Leonard Schwartz Wednesday 05/11/2022 1500 room 8 Return Appointment in 2 weeks. Leonard Schwartz Wednesday 05/18/2022 1500 room 8 Anesthetic: (In clinic) Topical Lidocaine 5% applied to wound bed Cellular or Tissue Based Products: Cellular or Tissue Based Product Type: - Run IVR for Kerrecis- denied #1 Apligraf applied 02/23/2022 #2 Apligraf applied 03/02/2022 #3 Apligraf applied 03/16/2022 #4 Apligraf applied 03/30/2022 HOLD APLIGRAF this week. Cellular or Tissue Based Product applied to wound bed, secured with steri-strips, cover with Adaptic or Mepitel. (DO NOT REMOVE). Bathing/ Shower/ Hygiene: May shower with protection but do not get wound dressing(s) wet. Protect dressing(s) with water repellant cover (for example, large plastic bag) or a cast cover and may then take shower. Edema Control - Lymphedema / SCD / Other: Elevate legs to the level of the heart or above for 30 minutes daily and/or when sitting for 3-4 times a day throughout the day. Avoid standing for long periods of time. Exercise regularly Moisturize legs daily. - apply every night before bed to left leg. Compression stocking or Garment 30-40 mm/Hg pressure to: - wear your VIVE compression stocking to left leg. apply in the morning and remove at night. Will apply tubigrip size [name] to left leg. Remove once you get home and use your compression stockings. KABLE, HAYWOOD ([id number]) [id number] Page 10 of 11 WOUND #7: - Malleolus Wound Laterality: Right, Medial Peri-Wound Care: Triamcinolone 15 (g) 1 x Per Week/30 Days Discharge Instructions: Use triamcinolone 15 (g) as directed Peri-Wound Care: Zinc Oxide Ointment 30g tube 1 x Per Week/30 Days Discharge Instructions: Apply Zinc Oxide to periwound with each dressing change Peri-Wound Care: Sween Lotion (Moisturizing lotion) 1 x Per Week/30  Days Discharge Instructions: Apply moisturizing lotion as directed Prim  Dressing: UrgoClean AG 4 X 5 (in/in) 1 x Per Week/30 Days ary Discharge Instructions: Apply directly to wound bed. Secondary Dressing: Optifoam Non-Adhesive Dressing, 4x4 in 1 x Per Week/30 Days Discharge Instructions: Apply medial and anterior lower leg with compression wrap. Secondary Dressing: Woven Gauze Sponge, Non-Sterile 4x4 in 1 x Per Week/30 Days Discharge Instructions: Apply over primary dressing as directed. Secondary Dressing: Zetuvit Plus 4x8 in 1 x Per Week/30 Days Discharge Instructions: Apply over primary dressing as directed. Com pression Wrap: Urgo K2 Lite, two layer compression system, regular 1 x Per Week/30 Days Discharge Instructions: Apply Urgo K2 Lite as directed (alternative to 3 layer compression). apply unna boot first layer to upper portion of lower leg to secure wrap in place. 1. I would recommend currently that we have the patient continue to any signs of infection or worsening. Based on biopsy and I do believe that we are moving in the right direction monitor. 2. I would recommend the patient should continue to utilize the compression wrapping which I think is doing a really good job here. 3. I am also going to recommend the patient should continue to use the Zetuvit were good actually be using a ergo clean Ag dressing in order to give this a shot and see how that does. We will see patient back for reevaluation in 1 week here in the clinic. If anything worsens or changes patient will contact our office for additional recommendations. Electronic Signature(s) Signed: 05/04/2022 4:48:39 PM By: Allen Derry PA-C Entered By: Allen Derry on 05/04/2022 16:48:39 -------------------------------------------------------------------------------- SuperBill Details Patient Name: Date of Service: Ludemann, [name] 05/04/2022 Medical Record Number: [id number] Patient Account Number: [id number] Date of  Birth/Sex: Treating RN: Feb 06, 1955 (67 y.o. Harlon Flor, Millard.Loa Primary Care Provider: [name] Other Clinician: Referring Provider: Treating Provider/Extender: Alver Fisher in Treatment: 39 Diagnosis Coding ICD-10 Codes Code Description I89.0 Lymphedema, not elsewhere classified I87.333 Chronic venous hypertension (idiopathic) with ulcer and inflammation of bilateral lower extremity L97.828 Non-pressure chronic ulcer of other part of left lower leg with other specified severity L97.818 Non-pressure chronic ulcer of other part of right lower leg with other specified severity E11.621 Type 2 diabetes mellitus with foot ulcer L97.518 Non-pressure chronic ulcer of other part of right foot with other specified severity Facility Procedures : CPT4 Code: 65784696 Description: 11042 - DEB SUBQ TISSUE 20 SQ CM/< ICD-10 Diagnosis Description L97.818 Non-pressure chronic ulcer of other part of right lower leg with other specified Modifier: severity Quantity: 1 Physician Procedures : CPT4 Code Description Modifier [id number] 11042 - WC PHYS SUBQ TISS 20 SQ CM ICD-10 Diagnosis Description L97.818 Non-pressure chronic ulcer of other part of right lower leg with other specified severity Quantity: 1 Electronic Signature(s) Signed: 05/04/2022 4:49:38 PM By: Dava Najjar, Dannielle Huh [name] ([id number]) [id number] Page 11 of 11 Entered By: Allen Derry on 05/04/2022 16:49:38

## 2022-05-06 NOTE — Progress Notes (Signed)
[name], [name] ([id number]) [id number] Page 1 of 5 Visit Report for 05/04/2022 Arrival Information Details Patient Name: Date of Service: [name], [name] 05/04/2022 3:00 PM Medical Record Number: [id number] Patient Account Number: [id number] Date of Birth/Sex: Treating RN: [birth year] (67 y.o. M) Primary Care Selicia Windom: [name] Other Clinician: Referring Keyanah Kozicki: Treating Marc Sivertsen/Extender: Alver Fisher in Treatment: 39 Visit Information History Since Last Visit Added or deleted any medications: No Patient Arrived: Ambulatory Any new allergies or adverse reactions: No Arrival Time: 15:18 Had a fall or experienced change in No Accompanied By: self activities of daily living that may affect Transfer Assistance: None risk of falls: Patient Identification Verified: Yes Signs or symptoms of abuse/neglect since last visito No Secondary Verification Process Completed: Yes Hospitalized since last visit: No Patient Requires Transmission-Based Precautions: No Implantable device outside of the clinic excluding No Patient Has Alerts: Yes cellular tissue based products placed in the center Patient Alerts: Patient on Blood Thinner since last visit: 01/2021 ABI L 1.13 R 1.06 Has Dressing in Place as Prescribed: Yes 01/2021 TBI L amp R 0.8 Has Compression in Place as Prescribed: Yes Pain Present Now: No Electronic Signature(s) Signed: 05/04/2022 4:04:07 PM By: Thayer Dallas Entered By: Thayer Dallas on 05/04/2022 15:20:20 -------------------------------------------------------------------------------- Compression Therapy Details Patient Name: Date of Service: [name], DA Manuela Neptune [name]. 05/04/2022 3:00 PM Medical Record Number: [id number] Patient Account Number: [id number] Date of Birth/Sex: Treating RN: 1955/03/07 (67 y.o. [name] Primary Care Stephon Weathers: [name] Other Clinician: Referring Zyara Riling: Treating Beuford Garcilazo/Extender: Alver Fisher in Treatment: 39 Compression Therapy Performed for Wound Assessment: Wound #7 Right,Medial Malleolus Performed By: Clinician Thayer Dallas, Compression Type: Double Layer Post Procedure Diagnosis Same as Pre-procedure Electronic Signature(s) Signed: 05/05/2022 4:51:53 PM By: Shawn Stall RN, BSN Entered By: Shawn Stall on 05/04/2022 15:33:29 -------------------------------------------------------------------------------- Encounter Discharge Information Details Patient Name: Date of Service: [name], DA [name] 05/04/2022 3:00 PM Medical Record Number: [id number] Patient Account Number: [id number] Date of Birth/Sex: Treating RN: Apr 27, 1955 (67 y.o. [name] Primary Care Brayon Bielefeld: [name] Other Clinician: Referring Tydus Sanmiguel: Treating Makya Phillis/Extender: Alver Fisher in Treatment: 39 Encounter Discharge Information Items Post Procedure Vitals Discharge Condition: Stable Temperature (F): 98.3 Ambulatory Status: Walker Pulse (bpm): 77 Discharge Destination: Home Respiratory Rate (breaths/min): 18 Transportation: Private Auto Blood Pressure (mmHg): 133/83 [name] ([id number]) [id number] Page 2 of 5 Accompanied By: self Schedule Follow-up Appointment: Yes Clinical Summary of Care: Electronic Signature(s) Signed: 05/05/2022 4:51:53 PM By: Shawn Stall RN, BSN Entered By: Shawn Stall on 05/04/2022 15:39:08 -------------------------------------------------------------------------------- Lower Extremity Assessment Details Patient Name: Date of Service: [name], DA [name] 05/04/2022 3:00 PM Medical Record Number: [id number] Patient Account Number: [id number] Date of Birth/Sex: Treating RN: [birth year] (67 y.o. M) Primary Care Logun Colavito: [name] Other Clinician: Referring Jaylend Reiland: Treating Catalina Salasar/Extender: Alver Fisher in Treatment: 39 Edema Assessment Assessed:  [Left: No] [Right: No] Edema: [Left: N] [Right: o] Calf Left: Right: Point of Measurement: 31 cm From Medial Instep 37.5 cm Ankle Left: Right: Point of Measurement: 11 cm From Medial Instep 25 cm Vascular Assessment Pulses: Dorsalis Pedis Palpable: [Right:Yes] Electronic Signature(s) Signed: 05/04/2022 4:04:07 PM By: Thayer Dallas Entered By: Thayer Dallas on 05/04/2022 15:21:04 -------------------------------------------------------------------------------- Multi-Disciplinary Care Plan Details Patient Name: Date of Service: [name], Sinclair Grooms [name]. 05/04/2022 3:00 PM Medical Record Number: [id number] Patient Account Number: [id number] Date of Birth/Sex: Treating RN: August 23, 1955 (67 y.o. [name] Primary Care Kemari Mares: [name]  Other Clinician: Referring Robertta Halfhill: Treating Layken Beg/Extender: Alver Fisher in Treatment: 39 Multidisciplinary Care Plan reviewed with physician Active Inactive Pain, Acute or Chronic Nursing Diagnoses: Pain, acute or chronic: actual or potential Potential alteration in comfort, pain Goals: Patient will verbalize adequate pain control and receive pain control interventions during procedures as needed Date Initiated: 08/04/2021 Target Resolution Date: 05/13/2022 Goal Status: Active Patient/caregiver will verbalize comfort level met [name] ([id number]) [id number] Page 3 of 5 Date Initiated: 08/04/2021 Target Resolution Date: 05/13/2022 Goal Status: Active Interventions: Encourage patient to take pain medications as prescribed Provide education on pain management Reposition patient for comfort Treatment Activities: Administer pain control measures as ordered : 08/04/2021 Notes: Electronic Signature(s) Signed: 05/05/2022 4:51:53 PM By: Shawn Stall RN, BSN Entered By: Shawn Stall on 05/04/2022 15:36:27 -------------------------------------------------------------------------------- Pain  Assessment Details Patient Name: Date of Service: [name], DA [name] 05/04/2022 3:00 PM Medical Record Number: [id number] Patient Account Number: [id number] Date of Birth/Sex: Treating RN: 09/27/1955 (67 y.o. M) Primary Care Rigby Swamy: [name] Other Clinician: Referring Demetric Parslow: Treating Azreal Stthomas/Extender: Alver Fisher in Treatment: 39 Active Problems Location of Pain Severity and Description of Pain Patient Has Paino No Site Locations Pain Management and Medication Current Pain Management: Electronic Signature(s) Signed: 05/04/2022 4:04:07 PM By: Thayer Dallas Entered By: Thayer Dallas on 05/04/2022 15:21:22 -------------------------------------------------------------------------------- Patient/Caregiver Education Details Patient Name: Date of Service: Vandyke, Bella Kennedy 4/24/2024andnbsp3:00 PM Medical Record Number: [id number] Patient Account Number: [id number] Date of Birth/Gender: Treating RN: 1955/02/27 (66 y.o. [name] Primary Care Physician: [name] Other Clinician: Referring Physician: Treating Physician/Extender: Alver Fisher in Treatment: [address] [place], Dannielle Huh [name] ([id number]) [phone].pdf Page 4 of 5 Education Provided To: Patient Education Topics Provided Wound/Skin Impairment: Handouts: Caring for Your Ulcer Methods: Explain/Verbal Responses: Reinforcements needed Electronic Signature(s) Signed: 05/05/2022 4:51:53 PM By: Shawn Stall RN, BSN Entered By: Shawn Stall on 05/04/2022 15:36:39 -------------------------------------------------------------------------------- Wound Assessment Details Patient Name: Date of Service: Ionescu, DA [name] 05/04/2022 3:00 PM Medical Record Number: [id number] Patient Account Number: [id number] Date of Birth/Sex: Treating RN: Oct 06, 1955 (66 y.o. M) Primary Care Shalamar Plourde: [name] Other Clinician: Referring Milda Lindvall: Treating  Johnni Wunschel/Extender: Alver Fisher in Treatment: 39 Wound Status Wound Number: 7 Primary Venous Leg Ulcer Etiology: Wound Location: Right, Medial Malleolus Secondary Lymphedema Wounding Event: Blister Etiology: Date Acquired: 07/21/2021 Wound Open Weeks Of Treatment: 39 Status: Clustered Wound: No Comorbid Sleep Apnea, Arrhythmia, Congestive Heart Failure, History: Hypertension, Hypotension, Peripheral Arterial Disease, Peripheral Venous Disease, Type II Diabetes, Gout, Osteoarthritis Photos Wound Measurements Length: (cm) 4.6 Width: (cm) 2.1 Depth: (cm) 0.5 Area: (cm) 7.587 Volume: (cm) 3.793 % Reduction in Area: 80.2% % Reduction in Volume: 85.8% Tunneling: No Undermining: No Wound Description Classification: Full Thickness With Exposed Suppor Exudate Amount: Medium Exudate Type: Serosanguineous Exudate Color: red, brown t Structures Wound Bed Granulation Amount: Large (67-100%) Necrotic Amount: Small (1-33%) Necrotic Quality: Adherent Slough Periwound Skin Texture Texture Color No Abnormalities Noted: No No Abnormalities Noted: No YOUSAF, SAINATO [name] ([id number]) [id number] Page 5 of 5 Callus: Yes Moisture No Abnormalities Noted: No Treatment Notes Wound #7 (Malleolus) Wound Laterality: Right, Medial Cleanser Peri-Wound Care Triamcinolone 15 (g) Discharge Instruction: Use triamcinolone 15 (g) as directed Zinc Oxide Ointment 30g tube Discharge Instruction: Apply Zinc Oxide to periwound with each dressing change Sween Lotion (Moisturizing lotion) Discharge Instruction: Apply moisturizing lotion as directed Topical Primary Dressing UrgoClean AG 4 X 5 (in/in) Discharge Instruction: Apply  directly to wound bed. Secondary Dressing Optifoam Non-Adhesive Dressing, 4x4 in Discharge Instruction: Apply medial and anterior lower leg with compression wrap. Woven Gauze Sponge, Non-Sterile 4x4 in Discharge Instruction:  Apply over primary dressing as directed. Zetuvit Plus 4x8 in Discharge Instruction: Apply over primary dressing as directed. Secured With Compression Wrap Urgo K2 Lite, two layer compression system, regular Discharge Instruction: Apply Urgo K2 Lite as directed (alternative to 3 layer compression). apply unna boot first layer to upper portion of lower leg to secure wrap in place. Compression Stockings Add-Ons Electronic Signature(s) Signed: 05/04/2022 4:04:07 PM By: Thayer Dallas Entered By: Thayer Dallas on 05/04/2022 15:25:00 -------------------------------------------------------------------------------- Vitals Details Patient Name: Date of Service: [place], DA [name] 05/04/2022 3:00 PM Medical Record Number: [id number] Patient Account Number: [id number] Date of Birth/Sex: Treating RN: 06-May-1955 (67 y.o. M) Primary Care Chief Walkup: [name] Other Clinician: Referring Lequan Dobratz: Treating Stillman Buenger/Extender: Alver Fisher in Treatment: 39 Vital Signs Time Taken: 15:20 Temperature (F): 98.3 Height (in): 71 Pulse (bpm): 77 Weight (lbs): 350 Respiratory Rate (breaths/min): 18 Body Mass Index (BMI): 48.8 Blood Pressure (mmHg): 133/83 Reference Range: 80 - 120 mg / dl Electronic Signature(s) Signed: 05/04/2022 4:04:07 PM By: Thayer Dallas Entered By: Thayer Dallas on 05/04/2022 15:25:16

## 2022-05-09 ENCOUNTER — Ambulatory Visit (HOSPITAL_COMMUNITY)
Admission: RE | Admit: 2022-05-09 | Discharge: 2022-05-09 | Disposition: A | Discharge status: Home / Self Care | Payer: Medicare Other | Source: Ambulatory Visit | Attending: Cardiology | Admitting: Cardiology

## 2022-05-09 DIAGNOSIS — I5022 Chronic systolic (congestive) heart failure: Secondary | ICD-10-CM | POA: Diagnosis not present

## 2022-05-11 ENCOUNTER — Encounter (HOSPITAL_BASED_OUTPATIENT_CLINIC_OR_DEPARTMENT_OTHER): Payer: Medicare Other | Attending: Physician Assistant | Admitting: Physician Assistant

## 2022-05-11 DIAGNOSIS — I890 Lymphedema, not elsewhere classified: Secondary | ICD-10-CM | POA: Insufficient documentation

## 2022-05-11 DIAGNOSIS — L97312 Non-pressure chronic ulcer of right ankle with fat layer exposed: Secondary | ICD-10-CM | POA: Diagnosis not present

## 2022-05-11 DIAGNOSIS — E11621 Type 2 diabetes mellitus with foot ulcer: Secondary | ICD-10-CM | POA: Diagnosis not present

## 2022-05-11 DIAGNOSIS — L97518 Non-pressure chronic ulcer of other part of right foot with other specified severity: Secondary | ICD-10-CM | POA: Diagnosis not present

## 2022-05-11 DIAGNOSIS — I87333 Chronic venous hypertension (idiopathic) with ulcer and inflammation of bilateral lower extremity: Secondary | ICD-10-CM | POA: Diagnosis not present

## 2022-05-11 DIAGNOSIS — E11622 Type 2 diabetes mellitus with other skin ulcer: Secondary | ICD-10-CM | POA: Diagnosis not present

## 2022-05-11 DIAGNOSIS — L97828 Non-pressure chronic ulcer of other part of left lower leg with other specified severity: Secondary | ICD-10-CM | POA: Diagnosis not present

## 2022-05-11 DIAGNOSIS — L97818 Non-pressure chronic ulcer of other part of right lower leg with other specified severity: Secondary | ICD-10-CM | POA: Diagnosis not present

## 2022-05-11 DIAGNOSIS — I872 Venous insufficiency (chronic) (peripheral): Secondary | ICD-10-CM | POA: Diagnosis not present

## 2022-05-12 NOTE — Progress Notes (Addendum)
ROLLO, FARQUHAR ([id number]) [id number] Page 1 of 11 Visit Report for 05/11/2022 Chief Complaint Document Details Patient Name: Date of Service: [name], [name] 05/11/2022 3:00 PM Medical Record Number: [id number] Patient Account Number: [id number] Date of Birth/Sex: Treating RN: 01-04-1956 (67 y.o. M) Primary Care Provider: [name] Other Clinician: Referring Provider: Treating Provider/Extender: Alver Fisher in Treatment: 40 Information Obtained from: Patient Chief Complaint 08/04/2021; patient returns to clinic with bilateral leg wounds as well as areas on the right foot Electronic Signature(s) Signed: 05/11/2022 3:37:05 PM By: Allen Derry PA-C Entered By: Allen Derry on 05/11/2022 15:37:05 -------------------------------------------------------------------------------- Debridement Details Patient Name: Date of Service: [name], [name] 05/11/2022 3:00 PM Medical Record Number: [id number] Patient Account Number: [id number] Date of Birth/Sex: Treating RN: [birth year] (67 y.o. Harlon Flor, Millard.Loa Primary Care Provider: [name] Other Clinician: Referring Provider: Treating Provider/Extender: Alver Fisher in Treatment: 40 Debridement Performed for Assessment: Wound #7 Right,Medial Malleolus Performed By: Physician Lenda Kelp, PA Debridement Type: Debridement Severity of Tissue Pre Debridement: Fat layer exposed Level of Consciousness (Pre-procedure): Awake and Alert Pre-procedure Verification/Time Out Yes - 15:48 Taken: Start Time: 15:49 Pain Control: Lidocaine 4% T opical Solution Percent of Wound Bed Debrided: 100% T Area Debrided (cm): otal 7.06 Tissue and other material debrided: Viable, Non-Viable, Slough, Subcutaneous, Slough Level: Skin/Subcutaneous Tissue Debridement Description: Excisional Instrument: Curette Bleeding: Minimum Hemostasis Achieved: Pressure End Time: 15:55 Procedural Pain:  0 Post Procedural Pain: 0 Response to Treatment: Procedure was tolerated well Level of Consciousness (Post- Awake and Alert procedure): Post Debridement Measurements of Total Wound Length: (cm) 4.5 Width: (cm) 2 Depth: (cm) 0.3 Volume: (cm) 2.121 Character of Wound/Ulcer Post Debridement: Improved Severity of Tissue Post Debridement: Fat layer exposed Post Procedure Diagnosis Same as Pre-procedure Electronic Signature(s) Signed: 05/11/2022 4:44:49 PM By: Shawn Stall RN, BSN Signed: 05/11/2022 5:01:35 PM By: Dava Najjar, Dannielle Huh [name] ([id number]) [id number] Page 2 of 11 Entered By: Shawn Stall on 05/11/2022 15:55:02 -------------------------------------------------------------------------------- HPI Details Patient Name: Date of Service: [name], [name]. 05/11/2022 3:00 PM Medical Record Number: [id number] Patient Account Number: [id number] Date of Birth/Sex: Treating RN: [birth year] (67 y.o. M) Primary Care Provider: [name] Other Clinician: Referring Provider: Treating Provider/Extender: Alver Fisher in Treatment: 40 History of Present Illness HPI Description: ADMISSION 03/22/2021 This is a 67 year old man with a past medical history significant for diabetes type 2, congestive heart failure, peripheral arterial disease, morbid obesity, venous insufficiency, and coronary artery disease. He has been followed by Dr. Loreta Ave in podiatry, who performed a transmetatarsal amputation on the left foot in August 2022. He had issues healing that wound, but based upon Dr. Kenna Gilbert notes, ultimately the TMA wound healed. During his recovery from that surgery, however, ulcers opened up over the DIP joint of the right second and third toe. These have apparently closed and reopened multiple times. It sounds like one of the issues has been moisture accumulation and maceration of the tissues causing them to reopen. At his last visit with Dr.  Loreta Ave, on March 01, 2021, there continues to be problems with moisture and he was referred to wound care for further evaluation and management. He had a formal aortogram with runoff performed prior to his TMA. The findings are copied here: Patient has inline flow to both feet with no significant flow-limiting lesion that would be amenable to percutaneous or open revascularization. He does have an element of small vessel disease and has  a short segment occlusion of the distal anterior tibial/dorsalis pedis artery on the left foot but does have posterior tibial artery flow. Would recommend management of wounds with amputation of toes 2 and 3 on the right foot if the wounds do not heal and deteriorate. Transmetatarsal amputation on the left side has as good a blood supply as it is going to get and hopefully this will heal in the future. Formal ABIs were done in January 2023. They are normal bilaterally. ABI Findings: +---------+------------------+-----+----------+--------+ Right Rt Pressure (mmHg)IndexWaveform Comment  +---------+------------------+-----+----------+--------+ Brachial 160     +---------+------------------+-----+----------+--------+ PTA 192 1.06 monophasic  +---------+------------------+-----+----------+--------+ DP 159 0.88 monophasic  +---------+------------------+-----+----------+--------+ Great T oe145 0.80    +---------+------------------+-----+----------+--------+ +--------+------------------+-----+---------+-------+ Left Lt Pressure (mmHg)IndexWaveform Comment +--------+------------------+-----+---------+-------+ [id number]     +--------+------------------+-----+---------+-------+ PTA 204 1.13 triphasic  +--------+------------------+-----+---------+-------+ DP 194 1.07 biphasic   +--------+------------------+-----+---------+-------+ +-------+-----------+-----------+------------+------------+ ABI/TBIT oday's  ABIT oday's TBIPrevious ABIPrevious TBI +-------+-----------+-----------+------------+------------+ Right 1.06 0.8 1.26 0.65  +-------+-----------+-----------+------------+------------+ Left 1.13 amputation 1.15 amputation  +-------+-----------+-----------+------------+------------+ Previous ABI on 08/06/20 at [facility] Pedal pressures falsely elevated due to medial calcification. Summary: Right: Resting right ankle-brachial index is within normal range. The right toe-brachial index is normal. Left: Resting left ankle-brachial index is within normal range. READMISSION 08/04/2021 Mr. Gardella is now a 67 year old man who I remember from this clinic many years ago I think he had a right lower extremity predominantly venous wound at the time. He was here for 1 visit in March of this year had wounds on his right second and third toes we apparently dressed them many and they healed so he did not come back. He is listed in [place] is being a diabetic although the patient denies this says he is verified it with his primary doctor. In any case over the last several weeks or so according the patient although these wounds look somewhat more chronic than that he has developed predominantly large wounds on the right medial and right lateral ankle smaller areas on the left leg and areas on the dorsal aspect of his right second and third toes. Its not clear how he has been dressing these. 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 [name], [name] ([id number]) [id number] Page 3 of 11 The patient has known PAD. He had an angiogram in August 2022 at which time he had nonhealing wounds in both feet. On the left his major vessels in the thigh were all patent. He had three-vessel patent to the level of the ankle. He had a very short occlusion in the left anterior tibial. On the right lower  extremity the major vessels in his thigh were all patent. He had three-vessel runoff to the foot sluggish filling of the anterior tibial artery. He was felt to have some component of small vessel disease but nothing that was amenable or needed revascularization. It was recommended that he have amputation of the second and third toes on the right foot if they did not heal He has been following with Dr. Loreta Ave of podiatry. Dr. Loreta Ave got him juxta lite stockings although I do not think he had them on properly he has uncontrolled edema in both legs Past medical history includes type 2 diabetes [although the patient really denies this], left TMA in 2022,lower extremity wounds in fact attendance at this clinic in 2009-2010, A-fib on Eliquis, chronic venous insufficiency with secondary lymphedema history of non-Hodgkin's lymphoma. 08-11-2021 upon evaluation today patient presents for follow-up  evaluation he was seen last Wednesday initially for inspection here in our clinic. With that being said he tells me that he unfortunately has been having a lot of drainage and is actually coming through his wrap. Fortunately I do not see any evidence of active infection locally or systemically at this time which is great news. No fevers, chills, nausea, vomiting, or diarrhea. With that being said there does appear to be some evidence of local infection based on what I am seeing today. 08-18-2021 upon evaluation today patient appears to be doing okay currently in regard to his wounds with that being said that he is doing much better but still has a long ways to go to get to where he wanted to be. I think the infection is significantly improved. He has another week of the antibiotic at this point. 08-25-2021 upon evaluation today patient's wounds are actually doing decently well he has erythema has significantly improved. I think the cellulitis is under controlled I am going to continue him on 2 more weeks of the Levaquin at  500 mg this is a lower dose but I am hoping it will be better for him. 09-08-2021 upon evaluation today patient appears to be doing excellent in regard to his wounds. Since I last saw him he was actually in the hospital where he ended up having a pacemaker put in. Subsequently he tells me that he is actually doing quite well although they were unsure whether they were going to do it due to the fact that he had the wounds on his legs. And then I am glad they did anything seem to be doing well. 09-15-2021 upon evaluation patient's wounds are actually showing signs of improvement. The right side wounds do appear to have some need for sharp debridement today and I Ernie Hew go ahead and proceed with that. I think that if we get the wounds cleaned up he will actually show signs of continued improvement. I am also leaning towards switching to [facility] which I think will be a much better option for him. 09-22-2021 patient's wounds are showing signs of excellent improvement. I am actually extremely pleased with where we stand and I think that the patient is making great progress. There does not appear to be any signs of active infection. 09-29-2021 upon evaluation today patient appears to be doing excellent in regard to his wounds. He is actually tolerating the dressing changes without complication. Fortunately I see no evidence of active infection locally or systemically at this time which is great news and overall I am extremely pleased with where we stand currently. 04-979 upon evaluation today patient actually appears to be doing excellent in regard to his wounds. The left leg is almost completely healed the right ankle is significantly smaller. Overall I am extremely pleased with where we stand at this point. I do not see any evidence of active infection at this time. 10-13-2021 upon evaluation today patient appears to be doing excellent in regard to his wounds. I really feel like he is making good progress  here and I am very pleased in that regard. Fortunately I do not see any signs of active infection at this time. We are using the [facility] topical antibiotic therapy. 10-20-2021 upon evaluation today patient appears to be doing well with regard to his wound on the right medial ankle region the left leg is almost completely healed. I am actually very pleased with where we stand today. 11-03-2021 upon evaluation today patient's wound actually is  going require some sharp debridement but appears to be doing much better which is great news. Fortunately I do not see any signs of active infection at this time. 11-10-2021 upon evaluation today patient's wound is actually showing signs of excellent improvement. Fortunately I do not see any evidence of infection locally or systemically which is great news and overall I am extremely pleased with where we stand today. I do believe he is making good progress he does have his [facility] topical antibiotics with him here today. 11-24-2021 upon evaluation today patient actually showing signs of excellent improvement this appears to be doing much better. Fortunately I do not see any evidence of infection locally or systemically at this time. 12-01-2021 upon evaluation today patient appears to be doing well currently in regard to his wound. He has been tolerating the dressing changes without complication. Fortunately I do not see any evidence of active infection at this time which is great news and overall I am extremely pleased with where we stand today. 12-15-2021 upon evaluation today patient appears to be doing well with regard to his wound. He is showing signs of improvement is slow but nonetheless we are making improvements here. Fortunately I do not see any evidence of active infection locally nor systemically at this time. We are still using the [facility] topical antibiotic over the open area only. 12-29-2021 upon evaluation today patient's wound actually is  showing signs of excellent improvement. It has been 2 weeks since I perform any debridement and it definitely shows he has some tissue that needs to be cleaned away but I think we can do so quite easily and readily today. The good news is I do not see any signs of infection I think he is doing much better in that regard. Overall I am extremely pleased with where we stand. 01-12-2022 upon evaluation today patient appears to be doing well currently in regard to his wound although it is not getting significantly smaller it is also not getting any larger. Fortunately I do not see any evidence of infection locally nor systemically which is great news and overall I am extremely pleased with where things stand currently. 01-19-2022 upon evaluation today patient appears to be doing well currently in regard to his wound. The PolyMem actually seems to have done extremely well for him. Fortunately I do not see any signs of infection locally nor systemically at this time. 01-26-2022 upon evaluation today patient appears to be making progress. Fortunately there does not appear to be any signs of infection which is great news. No fevers, chills, nausea, vomiting, or diarrhea. With that being said this is very slow to heal and although it is smaller I still feel like we may want to try to do something to speed this up I think that a skin substitute could be beneficial, look into Kerecis. 02-02-2022 upon evaluation today patient's wound is actually showing signs of excellent improvement. I do not see any evidence of infection and overall I think that we are headed in the right direction. Fortunately I think that he is tolerating the dressing changes without complication. 02-09-2022 upon evaluation today patient appears to be doing well currently in regard to his wound. It does look a little bit macerated but fortunately does not appear to be showing any signs of significant skin breakdown over the macerated area which is  good news. Fortunately I do not see any evidence of active infection locally nor systemically at this point which is great news. 02-23-2022  upon evaluation today patient appears to be doing well currently in regard to his wounds. In fact his area on the ankle is actually showing signs of healing quite nicely. We do have the Apligraf ready today I am hopeful this is going to speed things up and get this closed much more effectively and quickly. Fortunately I do not see any signs of active infection locally nor systemically at this time which is great news. [name], [name] ([id number]) [id number] Page 4 of 11 03-02-2022 upon evaluation today patient appears to be doing excellent in regard to his wound. He is actually been tolerating the dressing changes without complication the wound on the left medial lower extremity is doing quite well and Apligraf seems to have been extremely beneficial for him. I am extremely pleased with where we stand today. 03/16/2022: The wound measurements are smaller today. He has accumulation of a yellow crust around the edges and slough on the surface. There is a musty odor coming from the wound. He has been getting Apligraf. 03-30-2022 upon evaluation today patient appears to be making progress. In regard to his leg ulcer. This is slow but nonetheless the Apligraf has fed things up. Fortunately I do not see any evidence of infection locally nor systemically at this time. No fevers, chills, nausea, vomiting, or diarrhea. Patient is here for Apligraf #4 today. 04-13-2022 upon evaluation today patient appears to be doing a little bit more poorly in regard to his wound. He actually feels like his wrap may have been a little bit tight. Fortunately there does not appear to be any signs of active infection locally nor systemically which is great news and I am pleased in that regard. 04-20-2022 upon evaluation today patient appears to be doing better in regard to  his wound from the standpoint of swelling he is actually lost 33 pounds on torsemide in the past week his leg is significantly smaller compared to what it has been. Fortunately I do not see any signs of active infection locally nor systemically at this time. 04-27-2022 upon evaluation today patient's wound actually is showing signs of erythema and warmth around the edges of the wound I am actually concerned about the possibility of infection. I think we probably need to obtain a wound culture and also can recommend based on what we are seeing that we go ahead and have the patient continue to utilize the compression wrapping which I think has been of benefit. 05-04-2022 upon evaluation today patient appears to be doing well currently in regard to his wound. He has been tolerating the dressing changes without complication. Fortunately there does not appear to be any signs of active infection locally or systemically which is great news. 05-11-2022 upon evaluation today patient actually appears to be doing excellent. He has been tolerating the dressing changes we actually were trialing a new medication on him last week and this was the ergo clean Ag dressing. With that being said this actually seems to have done extremely well at this point. Fortunately I do not see any signs of active infection locally or systemically at this time. Electronic Signature(s) Signed: 05/11/2022 4:53:50 PM By: Allen Derry PA-C Entered By: Allen Derry on 05/11/2022 16:53:50 -------------------------------------------------------------------------------- Physical Exam Details Patient Name: Date of Service: [name], [name] 05/11/2022 3:00 PM Medical Record Number: [id number] Patient Account Number: [id number] Date of Birth/Sex: Treating RN: December 18, 1955 (67 y.o. M) Primary Care Provider: [name] Other Clinician: Referring Provider: Treating Provider/Extender: Alver Fisher  in Treatment:  40 Constitutional Well-nourished and well-hydrated in no acute distress. Respiratory normal breathing without difficulty. Psychiatric this patient is able to make decisions and demonstrates good insight into disease process. Alert and Oriented x 3. pleasant and cooperative. Notes Upon inspection patient's wound bed actually showed signs of good granulation epithelization at this point. Actually feel like this is the best that the wound has looked in quite some time. I am extremely pleased with where we stand currently. Electronic Signature(s) Signed: 05/11/2022 4:54:07 PM By: Allen Derry PA-C Entered By: Allen Derry on 05/11/2022 16:54:07 -------------------------------------------------------------------------------- Physician Orders Details Patient Name: Date of Service: [name], [name] 05/11/2022 3:00 PM Medical Record Number: [id number] Patient Account Number: [id number] Date of Birth/Sex: Treating RN: 1955-06-30 (67 y.o. [name] Primary Care Provider: [name] Other Clinician: Referring Provider: Treating Provider/Extender: Alver Fisher in Treatment: [phone] Verbal / Phone Orders: No Diagnosis Coding ICD-10 Coding FINDLAY, DAGHER ([id number]) [id number] Page 5 of 11 Code Description I89.0 Lymphedema, not elsewhere classified I87.333 Chronic venous hypertension (idiopathic) with ulcer and inflammation of bilateral lower extremity L97.828 Non-pressure chronic ulcer of other part of left lower leg with other specified severity L97.818 Non-pressure chronic ulcer of other part of right lower leg with other specified severity E11.621 Type 2 diabetes mellitus with foot ulcer L97.518 Non-pressure chronic ulcer of other part of right foot with other specified severity Follow-up Appointments ppointment in 1 week. Leonard Schwartz Wednesday 05/18/2022 1500 room 8 Return A ppointment in 2 weeks. Leonard Schwartz Wednesday 05/25/2022 1500 room 8 Return  A Anesthetic (In clinic) Topical Lidocaine 5% applied to wound bed Cellular or Tissue Based Products Cellular or Tissue Based Product Type: - Run IVR for Kerrecis- denied #1 Apligraf applied 02/23/2022 #2 Apligraf applied 03/02/2022 #3 Apligraf applied 03/16/2022 #4 Apligraf applied 03/30/2022 HOLD APLIGRAF this week. Cellular or Tissue Based Product applied to wound bed, secured with steri-strips, cover with Adaptic or Mepitel. (DO NOT REMOVE). Bathing/ Shower/ Hygiene May shower with protection but do not get wound dressing(s) wet. Protect dressing(s) with water repellant cover (for example, large plastic bag) or a cast cover and may then take shower. Edema Control - Lymphedema / SCD / Other Elevate legs to the level of the heart or above for 30 minutes daily and/or when sitting for 3-4 times a day throughout the day. Avoid standing for long periods of time. Exercise regularly Moisturize legs daily. - apply every night before bed to left leg. Compression stocking or Garment 30-40 mm/Hg pressure to: - wear your VIVE compression stocking to left leg. apply in the morning and remove at night. Will apply tubigrip size [name] to left leg. Remove once you get home and use your compression stockings. Wound Treatment Wound #7 - Malleolus Wound Laterality: Right, Medial Peri-Wound Care: Zinc Oxide Ointment 30g tube 1 x Per Week/30 Days Discharge Instructions: Apply Zinc Oxide to periwound with each dressing change Peri-Wound Care: Sween Lotion (Moisturizing lotion) 1 x Per Week/30 Days Discharge Instructions: Apply moisturizing lotion as directed Topical: ketoconazole and TCA cream 1 x Per Week/30 Days Discharge Instructions: ****APPLY TO THE IRRITATED SKIN AT UPPER PORTION OF LOWER LEG.**** Prim Dressing: UrgoClean AG 4 X 5 (in/in) 1 x Per Week/30 Days ary Discharge Instructions: Apply directly to wound bed. Secondary Dressing: Optifoam Non-Adhesive Dressing, 4x4 in 1 x Per Week/30 Days Discharge  Instructions: Apply medial and anterior lower leg with compression wrap. Secondary Dressing: Woven Gauze Sponge, Non-Sterile 4x4 in 1 x Per  Week/30 Days Discharge Instructions: Apply over primary dressing as directed. Secondary Dressing: Zetuvit Plus 4x8 in 1 x Per Week/30 Days Discharge Instructions: Apply over primary dressing as directed. Compression Wrap: Urgo K2 Lite, two layer compression system, regular 1 x Per Week/30 Days Discharge Instructions: Apply Urgo K2 Lite as directed (alternative to 3 layer compression). Electronic Signature(s) Signed: 05/11/2022 4:44:49 PM By: Shawn Stall RN, BSN Signed: 05/11/2022 5:01:35 PM By: Allen Derry PA-C Entered By: Shawn Stall on 05/11/2022 15:56:57 -------------------------------------------------------------------------------- Problem List Details Patient Name: Date of Service: [name], [name] 05/11/2022 3:00 PM Medical Record Number: [id number] Patient Account Number: [id number] Date of Birth/Sex: Treating RN: 05-31-1955 (67 y.o. [name], [name] ([id number]) [id number] Page 6 of 11 Primary Care Provider: [name] Other Clinician: Referring Provider: Treating Provider/Extender: Alver Fisher in Treatment: 40 Active Problems ICD-10 Encounter Code Description Active Date MDM Diagnosis I89.0 Lymphedema, not elsewhere classified 08/04/2021 No Yes I87.333 Chronic venous hypertension (idiopathic) with ulcer and inflammation of 08/04/2021 No Yes bilateral lower extremity L97.828 Non-pressure chronic ulcer of other part of left lower leg with other specified 08/04/2021 No Yes severity L97.818 Non-pressure chronic ulcer of other part of right lower leg with other specified 08/04/2021 No Yes severity E11.621 Type 2 diabetes mellitus with foot ulcer 08/04/2021 No Yes L97.518 Non-pressure chronic ulcer of other part of right foot with other specified 08/04/2021 No Yes severity Inactive  Problems Resolved Problems Electronic Signature(s) Signed: 05/11/2022 3:36:15 PM By: Allen Derry PA-C Entered By: Allen Derry on 05/11/2022 15:36:15 -------------------------------------------------------------------------------- Progress Note Details Patient Name: Date of Service: [name], [name] 05/11/2022 3:00 PM Medical Record Number: [id number] Patient Account Number: [id number] Date of Birth/Sex: Treating RN: 08/16/1955 (67 y.o. M) Primary Care Provider: [name] Other Clinician: Referring Provider: Treating Provider/Extender: Alver Fisher in Treatment: 40 Subjective Chief Complaint Information obtained from Patient 08/04/2021; patient returns to clinic with bilateral leg wounds as well as areas on the right foot History of Present Illness (HPI) ADMISSION 03/22/2021 This is a 67 year old man with a past medical history significant for diabetes type 2, congestive heart failure, peripheral arterial disease, morbid obesity, venous insufficiency, and coronary artery disease. He has been followed by Dr. Loreta Ave in podiatry, who performed a transmetatarsal amputation on the left foot in August 2022. He had issues healing that wound, but based upon Dr. Kenna Gilbert notes, ultimately the TMA wound healed. During his recovery from that surgery, however, ulcers opened up over the DIP joint of the right second and third toe. These have apparently closed and reopened multiple times. It sounds like one of the issues has been moisture accumulation and maceration of the tissues causing them to reopen. At his last visit with Dr. Loreta Ave, on March 01, 2021, there continues to be problems with moisture and he was referred to wound care for further evaluation and management. He had a formal aortogram with runoff performed prior to his TMA. The findings are copied hereQUINLIN, CONANT ([id number]) [id number] Page 7 of 11 Patient has inline flow to both  feet with no significant flow-limiting lesion that would be amenable to percutaneous or open revascularization. He does have an element of small vessel disease and has a short segment occlusion of the distal anterior tibial/dorsalis pedis artery on the left foot but does have posterior tibial artery flow. Would recommend management of wounds with amputation of toes 2 and 3 on the right foot if the wounds do not heal and deteriorate.  Transmetatarsal amputation on the left side has as good a blood supply as it is going to get and hopefully this will heal in the future. Formal ABIs were done in January 2023. They are normal bilaterally. ABI Findings: +---------+------------------+-----+----------+--------+ Right Rt Pressure (mmHg)IndexWaveform Comment  +---------+------------------+-----+----------+--------+ Brachial 160    +---------+------------------+-----+----------+--------+ PTA 192 1.06 monophasic  +---------+------------------+-----+----------+--------+ DP 159 0.88 monophasic  +---------+------------------+-----+----------+--------+ Great T oe145 0.80    +---------+------------------+-----+----------+--------+ +--------+------------------+-----+---------+-------+ Left Lt Pressure (mmHg)IndexWaveform Comment +--------+------------------+-----+---------+-------+ [id number]    +--------+------------------+-----+---------+-------+ PTA 204 1.13 triphasic  +--------+------------------+-----+---------+-------+ DP 194 1.07 biphasic   +--------+------------------+-----+---------+-------+ +-------+-----------+-----------+------------+------------+ ABI/TBIT oday's ABIT oday's TBIPrevious ABIPrevious TBI +-------+-----------+-----------+------------+------------+ Right 1.06 0.8 1.26 0.65  +-------+-----------+-----------+------------+------------+ Left 1.13 amputation 1.15 amputation   +-------+-----------+-----------+------------+------------+ Previous ABI on 08/06/20 at [facility] Pedal pressures falsely elevated due to medial calcification. Summary: Right: Resting right ankle-brachial index is within normal range. The right toe-brachial index is normal. Left: Resting left ankle-brachial index is within normal range. READMISSION 08/04/2021 Mr. Wigley is now a 67 year old man who I remember from this clinic many years ago I think he had a right lower extremity predominantly venous wound at the time. He was here for 1 visit in March of this year had wounds on his right second and third toes we apparently dressed them many and they healed so he did not come back. He is listed in  is being a diabetic although the patient denies this says he is verified it with his primary doctor. In any case over the last several weeks or so according the patient although these wounds look somewhat more chronic than that he has developed predominantly large wounds on the right medial and right lateral ankle smaller areas on the left leg and areas on the dorsal aspect of his right second and third toes. Its not clear how he has been dressing these. 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 The patient has known PAD. He had an angiogram in August 2022 at which time he had nonhealing wounds in both feet. On the left his major vessels in the thigh were all patent. He had three-vessel patent to the level of the ankle. He had a very short occlusion in the left anterior tibial. On the right lower extremity the major vessels in his thigh were all patent. He had three-vessel runoff to the foot sluggish filling of the anterior tibial artery. He was felt to have some component of small vessel disease but nothing that was amenable or needed revascularization. It was recommended that he have amputation of the second and  third toes on the right foot if they did not heal He has been following with Dr. Loreta Ave of podiatry. Dr. Loreta Ave got him juxta lite stockings although I do not think he had them on properly he has uncontrolled edema in both legs Past medical history includes type 2 diabetes [although the patient really denies this], left TMA in 2022,lower extremity wounds in fact attendance at this clinic in 2009-2010, A-fib on Eliquis, chronic venous insufficiency with secondary lymphedema history of non-Hodgkin's lymphoma. 08-11-2021 upon evaluation today patient presents for follow-up evaluation he was seen last Wednesday initially for inspection here in our clinic. With that being said he tells me that he unfortunately has been having a lot of drainage and is actually coming through his wrap. Fortunately I do not see any evidence of active infection locally or systemically at this time which is great news.  No fevers, chills, nausea, vomiting, or diarrhea. With that being said there does appear to be some evidence of local infection based on what I am seeing today. 08-18-2021 upon evaluation today patient appears to be doing okay currently in regard to his wounds with that being said that he is doing much better but still has a long ways to go to get to where he wanted to be. I think the infection is significantly improved. He has another week of the antibiotic at this point. 08-25-2021 upon evaluation today patient's wounds are actually doing decently well he has erythema has significantly improved. I think the cellulitis is under controlled I am going to continue him on 2 more weeks of the Levaquin at 500 mg this is a lower dose but I am hoping it will be better for him. 09-08-2021 upon evaluation today patient appears to be doing excellent in regard to his wounds. Since I last saw him he was actually in the hospital where he ended up having a pacemaker put in. Subsequently he tells me that he is actually doing quite  well although they were unsure whether they were going to do it due to the fact that he had the wounds on his legs. And then I am glad they did anything seem to be doing well. 09-15-2021 upon evaluation patient's wounds are actually showing signs of improvement. The right side wounds do appear to have some need for sharp debridement today and I Ernie Hew go ahead and proceed with that. I think that if we get the wounds cleaned up he will actually show signs of continued improvement. I am also leaning towards switching to [facility] which I think will be a much better option for him. MATHIUS, BIRKELAND ([id number]) [id number] Page 8 of 11 09-22-2021 patient's wounds are showing signs of excellent improvement. I am actually extremely pleased with where we stand and I think that the patient is making great progress. There does not appear to be any signs of active infection. 09-29-2021 upon evaluation today patient appears to be doing excellent in regard to his wounds. He is actually tolerating the dressing changes without complication. Fortunately I see no evidence of active infection locally or systemically at this time which is great news and overall I am extremely pleased with where we stand currently. 04-979 upon evaluation today patient actually appears to be doing excellent in regard to his wounds. The left leg is almost completely healed the right ankle is significantly smaller. Overall I am extremely pleased with where we stand at this point. I do not see any evidence of active infection at this time. 10-13-2021 upon evaluation today patient appears to be doing excellent in regard to his wounds. I really feel like he is making good progress here and I am very pleased in that regard. Fortunately I do not see any signs of active infection at this time. We are using the [facility] topical antibiotic therapy. 10-20-2021 upon evaluation today patient appears to be doing well with  regard to his wound on the right medial ankle region the left leg is almost completely healed. I am actually very pleased with where we stand today. 11-03-2021 upon evaluation today patient's wound actually is going require some sharp debridement but appears to be doing much better which is great news. Fortunately I do not see any signs of active infection at this time. 11-10-2021 upon evaluation today patient's wound is actually showing signs of excellent improvement. Fortunately I do not see any  evidence of infection locally or systemically which is great news and overall I am extremely pleased with where we stand today. I do believe he is making good progress he does have his [facility] topical antibiotics with him here today. 11-24-2021 upon evaluation today patient actually showing signs of excellent improvement this appears to be doing much better. Fortunately I do not see any evidence of infection locally or systemically at this time. 12-01-2021 upon evaluation today patient appears to be doing well currently in regard to his wound. He has been tolerating the dressing changes without complication. Fortunately I do not see any evidence of active infection at this time which is great news and overall I am extremely pleased with where we stand today. 12-15-2021 upon evaluation today patient appears to be doing well with regard to his wound. He is showing signs of improvement is slow but nonetheless we are making improvements here. Fortunately I do not see any evidence of active infection locally nor systemically at this time. We are still using the [facility] topical antibiotic over the open area only. 12-29-2021 upon evaluation today patient's wound actually is showing signs of excellent improvement. It has been 2 weeks since I perform any debridement and it definitely shows he has some tissue that needs to be cleaned away but I think we can do so quite easily and readily today. The good news is I do  not see any signs of infection I think he is doing much better in that regard. Overall I am extremely pleased with where we stand. 01-12-2022 upon evaluation today patient appears to be doing well currently in regard to his wound although it is not getting significantly smaller it is also not getting any larger. Fortunately I do not see any evidence of infection locally nor systemically which is great news and overall I am extremely pleased with where things stand currently. 01-19-2022 upon evaluation today patient appears to be doing well currently in regard to his wound. The PolyMem actually seems to have done extremely well for him. Fortunately I do not see any signs of infection locally nor systemically at this time. 01-26-2022 upon evaluation today patient appears to be making progress. Fortunately there does not appear to be any signs of infection which is great news. No fevers, chills, nausea, vomiting, or diarrhea. With that being said this is very slow to heal and although it is smaller I still feel like we may want to try to do something to speed this up I think that a skin substitute could be beneficial, look into Kerecis. 02-02-2022 upon evaluation today patient's wound is actually showing signs of excellent improvement. I do not see any evidence of infection and overall I think that we are headed in the right direction. Fortunately I think that he is tolerating the dressing changes without complication. 02-09-2022 upon evaluation today patient appears to be doing well currently in regard to his wound. It does look a little bit macerated but fortunately does not appear to be showing any signs of significant skin breakdown over the macerated area which is good news. Fortunately I do not see any evidence of active infection locally nor systemically at this point which is great news. 02-23-2022 upon evaluation today patient appears to be doing well currently in regard to his wounds. In fact his area  on the ankle is actually showing signs of healing quite nicely. We do have the Apligraf ready today I am hopeful this is going to speed things up and  get this closed much more effectively and quickly. Fortunately I do not see any signs of active infection locally nor systemically at this time which is great news. 03-02-2022 upon evaluation today patient appears to be doing excellent in regard to his wound. He is actually been tolerating the dressing changes without complication the wound on the left medial lower extremity is doing quite well and Apligraf seems to have been extremely beneficial for him. I am extremely pleased with where we stand today. 03/16/2022: The wound measurements are smaller today. He has accumulation of a yellow crust around the edges and slough on the surface. There is a musty odor coming from the wound. He has been getting Apligraf. 03-30-2022 upon evaluation today patient appears to be making progress. In regard to his leg ulcer. This is slow but nonetheless the Apligraf has fed things up. Fortunately I do not see any evidence of infection locally nor systemically at this time. No fevers, chills, nausea, vomiting, or diarrhea. Patient is here for Apligraf #4 today. 04-13-2022 upon evaluation today patient appears to be doing a little bit more poorly in regard to his wound. He actually feels like his wrap may have been a little bit tight. Fortunately there does not appear to be any signs of active infection locally nor systemically which is great news and I am pleased in that regard. 04-20-2022 upon evaluation today patient appears to be doing better in regard to his wound from the standpoint of swelling he is actually lost 33 pounds on torsemide in the past week his leg is significantly smaller compared to what it has been. Fortunately I do not see any signs of active infection locally nor systemically at this time. 04-27-2022 upon evaluation today patient's wound actually is  showing signs of erythema and warmth around the edges of the wound I am actually concerned about the possibility of infection. I think we probably need to obtain a wound culture and also can recommend based on what we are seeing that we go ahead and have the patient continue to utilize the compression wrapping which I think has been of benefit. 05-04-2022 upon evaluation today patient appears to be doing well currently in regard to his wound. He has been tolerating the dressing changes without complication. Fortunately there does not appear to be any signs of active infection locally or systemically which is great news. 05-11-2022 upon evaluation today patient actually appears to be doing excellent. He has been tolerating the dressing changes we actually were trialing a new medication on him last week and this was the ergo clean Ag dressing. With that being said this actually seems to have done extremely well at this point. Fortunately I do not see any signs of active infection locally or systemically at this time. [name], [name] ([id number]) [id number] Page 9 of 11 Objective Constitutional Well-nourished and well-hydrated in no acute distress. Vitals Time Taken: 3:34 PM, Height: 71 in, Weight: 350 lbs, BMI: 48.8, Temperature: 98.2 F, Pulse: 92 bpm, Respiratory Rate: 18 breaths/min, Blood Pressure: 120/74 mmHg. Respiratory normal breathing without difficulty. Psychiatric this patient is able to make decisions and demonstrates good insight into disease process. Alert and Oriented x 3. pleasant and cooperative. General Notes: Upon inspection patient's wound bed actually showed signs of good granulation epithelization at this point. Actually feel like this is the best that the wound has looked in quite some time. I am extremely pleased with where we stand currently. Integumentary (Hair, Skin) Wound #7 status is Open. Original  cause of wound was Blister. The date acquired  was: 07/21/2021. The wound has been in treatment 40 weeks. The wound is located on the Right,Medial Malleolus. The wound measures 4.5cm length x 2cm width x 0.3cm depth; 7.069cm^2 area and 2.121cm^3 volume. There is no tunneling or undermining noted. There is a medium amount of serosanguineous drainage noted. There is large (67-100%) granulation within the wound bed. There is a small (1-33%) amount of necrotic tissue within the wound bed including Adherent Slough. The periwound skin appearance exhibited: Callus. 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 There was a Excisional Skin/Subcutaneous Tissue Debridement with a total area of 7.06 sq cm performed by Lenda Kelp, PA. With the following instrument(s): Curette to remove Viable and Non-Viable tissue/material. Material removed includes Subcutaneous Tissue and Slough and after achieving pain control using Lidocaine 4% T opical Solution. A time out was conducted at 15:48, prior to the start of the procedure. A Minimum amount of bleeding was controlled with Pressure. The procedure was tolerated well with a pain level of 0 throughout and a pain level of 0 following the procedure. Post Debridement Measurements: 4.5cm length x 2cm width x 0.3cm depth; 2.121cm^3 volume. Character of Wound/Ulcer Post Debridement is improved. Severity of Tissue Post  Debridement is: Fat layer exposed. Post procedure Diagnosis Wound #7: Same as Pre-Procedure Pre-procedure diagnosis of Wound #7 is a Venous Leg Ulcer located on the Right,Medial Malleolus . There was a Double Layer Compression Therapy Procedure by Thayer Dallas. Post procedure Diagnosis Wound #7: Same as Pre-Procedure Plan Follow-up Appointments: Return Appointment in 1 week. Leonard Schwartz Wednesday 05/18/2022 1500 room 8 Return Appointment in 2 weeks. Leonard Schwartz Wednesday 05/25/2022 1500 room 8 Anesthetic: (In clinic) Topical Lidocaine 5% applied to wound bed Cellular or Tissue Based Products: Cellular or Tissue Based Product Type: - Run IVR for Kerrecis- denied #1 Apligraf applied 02/23/2022 #2 Apligraf applied 03/02/2022 #3 Apligraf applied 03/16/2022 #4 Apligraf applied 03/30/2022 HOLD APLIGRAF this week. SHALEV, HELMINIAK ([id number]) [id number] Page 10 of 11 Cellular or Tissue Based Product applied to wound bed, secured with steri-strips, cover with Adaptic or Mepitel. (DO NOT REMOVE). Bathing/ Shower/ Hygiene: May shower with protection but do not get wound dressing(s) wet. Protect dressing(s) with water repellant cover (for example, large plastic bag) or a cast cover and may then take shower. Edema Control - Lymphedema / SCD / Other: Elevate legs to the level of the heart or above for 30 minutes daily and/or when sitting for 3-4 times a day throughout the day. Avoid standing for long periods of time. Exercise regularly Moisturize legs daily. - apply every night before bed to left leg. Compression stocking or Garment 30-40 mm/Hg pressure to: - wear your VIVE compression stocking to left leg. apply in the morning and remove at night. Will apply tubigrip size [name] to left leg. Remove once you get home and use your compression stockings. WOUND #7: - Malleolus Wound Laterality: Right, Medial Peri-Wound Care: Zinc Oxide Ointment 30g  tube 1 x Per Week/30 Days Discharge Instructions:  Apply Zinc Oxide to periwound with each dressing change Peri-Wound Care: Sween Lotion (Moisturizing lotion) 1 x Per Week/30 Days Discharge Instructions: Apply moisturizing lotion as directed Topical: ketoconazole and TCA cream 1 x Per Week/30 Days Discharge Instructions: ****APPLY TO THE IRRITATED SKIN AT UPPER PORTION OF LOWER LEG.**** Prim Dressing: UrgoClean AG 4 X 5 (in/in) 1 x Per Week/30 Days ary Discharge Instructions: Apply directly to wound bed. Secondary Dressing: Optifoam Non-Adhesive Dressing, 4x4 in 1 x Per Week/30 Days Discharge Instructions: Apply medial and anterior lower leg with compression wrap. Secondary Dressing: Woven Gauze Sponge, Non-Sterile 4x4 in 1 x Per Week/30 Days Discharge Instructions: Apply over primary dressing as directed. Secondary Dressing: Zetuvit Plus 4x8 in 1 x Per Week/30 Days Discharge Instructions: Apply over primary dressing as directed. Com pression Wrap: Urgo K2 Lite, two layer compression system, regular 1 x Per Week/30 Days Discharge Instructions: Apply Urgo K2 Lite as directed (alternative to 3 layer compression). 1. I am good recommend that we have the patient continue to monitor for any signs of infection or worsening. Based on what I am seeing I do believe that we are moving in the right direction here. 2. I am also can recommend that the patient should continue to use the Urgo clean Ag dressing which I think is doing a really good job. I am actually extremely pleased after just 1 week with how this appears compared to how it has been doing over the past several weeks. The patient likewise was extremely impressed with how this looks as well. 3. I am recommending continuing the compression wrap. I do still think that this is moving in the right direction and I am extremely pleased with where we stand currently. We will see patient back for reevaluation in 1 week here in the clinic. If anything worsens or changes patient will contact our office  for additional recommendations. Electronic Signature(s) Signed: 05/11/2022 4:55:51 PM By: Allen Derry PA-C Entered By: Allen Derry on 05/11/2022 16:55:51 -------------------------------------------------------------------------------- SuperBill Details Patient Name: Date of Service: Reinitz, [name] 05/11/2022 Medical Record Number: [id number] Patient Account Number: [id number] Date of Birth/Sex: Treating RN: 19-Jul-1955 (67 y.o. Harlon Flor, Millard.Loa Primary Care Provider: [name] Other Clinician: Referring Provider: Treating Provider/Extender: Alver Fisher in Treatment: 40 Diagnosis Coding ICD-10 Codes Code Description I89.0 Lymphedema, not elsewhere classified I87.333 Chronic venous hypertension (idiopathic) with ulcer and inflammation of bilateral lower extremity L97.828 Non-pressure chronic ulcer of other part of left lower leg with other specified severity L97.818 Non-pressure chronic ulcer of other part of right lower leg with other specified severity E11.621 Type 2 diabetes mellitus with foot ulcer L97.518 Non-pressure chronic ulcer of other part of right foot with other specified severity Facility Procedures : 3 CPT4 Code: 4098119 Description: 11042 - DEB SUBQ TISSUE 20 SQ CM/< ICD-10 Diagnosis Description L97.818 Non-pressure chronic ulcer of other part of right lower leg with other specified Modifier: severity Quantity: 1 Physician Procedures SAFIR, MICHALEC ([id number]): CPT4 Code Description 1308657 11042 - WC PHYS SUBQ TISS 20 SQ CM ICD-10 Diagnosis Description L97.818 Non-pressure chronic ulcer of other part of right lower leg with o [phone].pdf Page 11 of 11: Quantity Modifier 1 ther specified severity Electronic Signature(s) Signed: 05/11/2022 4:57:44 PM By: Allen Derry PA-C Previous Signature: 05/11/2022 4:44:49 PM Version By: Shawn Stall RN, BSN Entered By: Allen Derry on 05/11/2022 16:57:44

## 2022-05-13 NOTE — Progress Notes (Signed)
ALIOU, [name] ([id number]) [id number] Page 1 of 5 Visit Report for 05/11/2022 Arrival Information Details Patient Name: Date of Service: [name], [name] 05/11/2022 3:00 PM Medical Record Number: [id number] Patient Account Number: [id number] Date of Birth/Sex: Treating RN: July 28, 1955 (67 y.o. M) Primary Care Sonal Dorwart: [name] Other Clinician: Referring Jossie Smoot: Treating Neymar Dowe/Extender: Alver Fisher in Treatment: 40 Visit Information History Since Last Visit Added or deleted any medications: No Patient Arrived: [name] Any new allergies or adverse reactions: No Arrival Time: 15:34 Had a fall or experienced change in No Accompanied By: self activities of daily living that may affect Transfer Assistance: None risk of falls: Patient Requires Transmission-Based Precautions: No Signs or symptoms of abuse/neglect since last visito No Patient Has Alerts: Yes Hospitalized since last visit: No Patient Alerts: Patient on Blood Thinner Implantable device outside of the clinic excluding No 01/2021 ABI L 1.13 R 1.06 cellular tissue based products placed in the center 01/2021 TBI L amp R 0.8 since last visit: Has Compression in Place as Prescribed: Yes Pain Present Now: No Electronic Signature(s) Signed: 05/11/2022 4:40:31 PM By: Thayer Dallas Entered By: Thayer Dallas on 05/11/2022 15:34:31 -------------------------------------------------------------------------------- Compression Therapy Details Patient Name: Date of Service: [name], [name] 05/11/2022 3:00 PM Medical Record Number: [id number] Patient Account Number: [id number] Date of Birth/Sex: Treating RN: [birth year] (67 y.o. [name] Primary Care Endre Coutts: [name] Other Clinician: Referring Elgie Maziarz: Treating [name]/Extender: Alver Fisher in Treatment: 40 Compression Therapy Performed for Wound Assessment: Wound #7 Right,Medial  Malleolus Performed By: Clinician Thayer Dallas, Compression Type: Double Layer Post Procedure Diagnosis Same as Pre-procedure Electronic Signature(s) Signed: 05/11/2022 4:44:49 PM By: Shawn Stall RN, BSN Entered By: Shawn Stall on 05/11/2022 15:53:50 -------------------------------------------------------------------------------- Encounter Discharge Information Details Patient Name: Date of Service: [name], [name] 05/11/2022 3:00 PM Medical Record Number: [id number] Patient Account Number: [id number] Date of Birth/Sex: Treating RN: August 07, 1955 (67 y.o. [name] Primary Care Prabhjot Piscitello: [name] Other Clinician: Referring Kamaiya Antilla: Treating Londin Antone/Extender: Alver Fisher in Treatment: [phone] Encounter Discharge Information Items Post Procedure Vitals Discharge Condition: Stable Temperature (F): 98.2 Ambulatory Status: Walker Pulse (bpm): 82 Discharge Destination: Home Respiratory Rate (breaths/min): 20 Transportation: Private Auto Blood Pressure (mmHg): 120/74 Accompanied By: self Herbert Spires ([id number]) [id number] Page 2 of 5 Schedule Follow-up Appointment: Yes Clinical Summary of Care: Electronic Signature(s) Signed: 05/11/2022 4:44:49 PM By: Shawn Stall RN, BSN Entered By: Shawn Stall on 05/11/2022 15:58:30 -------------------------------------------------------------------------------- Lower Extremity Assessment Details Patient Name: Date of Service: [name], [name] 05/11/2022 3:00 PM Medical Record Number: [id number] Patient Account Number: [id number] Date of Birth/Sex: Treating RN: [birth year] (67 y.o. M) Primary Care Shalisa Mcquade: [name] Other Clinician: Referring Tirsa Gail: Treating Nikkie Liming/Extender: Alver Fisher in Treatment: 40 Edema Assessment Assessed: [Left: No] [Right: No] Edema: [Left: N] [Right: o] Calf Left: Right: Point of Measurement: 31 cm From Medial Instep 38.5  cm Ankle Left: Right: Point of Measurement: 11 cm From Medial Instep 25.4 cm Electronic Signature(s) Signed: 05/11/2022 4:40:31 PM By: Thayer Dallas Entered By: Thayer Dallas on 05/11/2022 15:35:26 -------------------------------------------------------------------------------- Multi-Disciplinary Care Plan Details Patient Name: Date of Service: [name], [name] 05/11/2022 3:00 PM Medical Record Number: [id number] Patient Account Number: [id number] Date of Birth/Sex: Treating RN: 11/20/1955 (67 y.o. [name] Primary Care Iyesha Such: [name] Other Clinician: Referring Kadden Osterhout: Treating Kalissa Grays/Extender: Alver Fisher in Treatment: 40 Multidisciplinary Care Plan reviewed with physician Active Inactive  Pain, Acute or Chronic Nursing Diagnoses: Pain, acute or chronic: actual or potential Potential alteration in comfort, pain Goals: Patient will verbalize adequate pain control and receive pain control interventions during procedures as needed Date Initiated: 08/04/2021 Target Resolution Date: 06/08/2022 Goal Status: Active Patient/caregiver will verbalize comfort level met Date Initiated: 08/04/2021 Target Resolution Date: 06/08/2022 Goal Status: Active Interventions: Encourage patient to take pain medications as prescribed Provide education on pain management Reposition patient for comfort DECKLEN, ZANIN ([id number]) [phone].pdf Page 3 of 5 Treatment Activities: Administer pain control measures as ordered : 08/04/2021 Notes: Electronic Signature(s) Signed: 05/11/2022 4:44:49 PM By: Shawn Stall RN, BSN Entered By: Shawn Stall on 05/11/2022 15:57:10 -------------------------------------------------------------------------------- Pain Assessment Details Patient Name: Date of Service: [name], [name] 05/11/2022 3:00 PM Medical Record Number: [id number] Patient Account Number: [id number] Date of Birth/Sex: Treating  RN: 1955/08/19 (67 y.o. M) Primary Care Hildreth Robart: [name] Other Clinician: Referring Jenae Tomasello: Treating Brigit Doke/Extender: Alver Fisher in Treatment: 40 Active Problems Location of Pain Severity and Description of Pain Patient Has Paino No Site Locations Pain Management and Medication Current Pain Management: Electronic Signature(s) Signed: 05/11/2022 4:40:31 PM By: Thayer Dallas Entered By: Thayer Dallas on 05/11/2022 15:35:04 -------------------------------------------------------------------------------- Patient/Caregiver Education Details Patient Name: Date of Service: [name], [name] 5/1/2024andnbsp3:00 PM Medical Record Number: [id number] Patient Account Number: [id number] Date of Birth/Gender: Treating RN: Jul 03, 1955 (66 y.o. [name] Primary Care Physician: [name] Other Clinician: Referring Physician: Treating Physician/Extender: Alver Fisher in Treatment: 40 Education Assessment Education Provided To: Patient Education Topics Provided Wound/Skin Impairment: Handouts: Caring for Your Ulcer [name], KONKEL ([id number]) [id number] Page 4 of 5 Methods: Explain/Verbal Responses: Reinforcements needed Electronic Signature(s) Signed: 05/11/2022 4:44:49 PM By: Shawn Stall RN, BSN Entered By: Shawn Stall on 05/11/2022 15:57:23 -------------------------------------------------------------------------------- Wound Assessment Details Patient Name: Date of Service: [name], [name] 05/11/2022 3:00 PM Medical Record Number: [id number] Patient Account Number: [id number] Date of Birth/Sex: Treating RN: 11-Apr-1955 (67 y.o. M) Primary Care Edith Lord: [name] Other Clinician: Referring Kaitlyn Franko: Treating Jalien Weakland/Extender: Alver Fisher in Treatment: 40 Wound Status Wound Number: 7 Primary Venous Leg Ulcer Etiology: Wound Location: Right, Medial  Malleolus Secondary Lymphedema Wounding Event: Blister Etiology: Date Acquired: 07/21/2021 Wound Open Weeks Of Treatment: 40 Status: Clustered Wound: No Comorbid Sleep Apnea, Arrhythmia, Congestive Heart Failure, History: Hypertension, Hypotension, Peripheral Arterial Disease, Peripheral Venous Disease, Type II Diabetes, Gout, Osteoarthritis Photos Wound Measurements Length: (cm) 4.5 Width: (cm) 2 Depth: (cm) 0.3 Area: (cm) 7.069 Volume: (cm) 2.121 % Reduction in Area: 81.5% % Reduction in Volume: 92.1% Epithelialization: Small (1-33%) Tunneling: No Undermining: No Wound Description Classification: Full Thickness With Exposed Suppor Exudate Amount: Medium Exudate Type: Serosanguineous Exudate Color: red, brown t Structures Wound Bed Granulation Amount: Large (67-100%) Necrotic Amount: Small (1-33%) Necrotic Quality: Adherent Slough Periwound Skin Texture Texture Color No Abnormalities Noted: No No Abnormalities Noted: No Callus: Yes Moisture No Abnormalities Noted: No Treatment Notes [name], [name] ([id number]) [id number] Page 5 of 5 Wound #7 (Malleolus) Wound Laterality: Right, Medial Cleanser Peri-Wound Care Zinc Oxide Ointment 30g tube Discharge Instruction: Apply Zinc Oxide to periwound with each dressing change Sween Lotion (Moisturizing lotion) Discharge Instruction: Apply moisturizing lotion as directed Topical ketoconazole and TCA cream Discharge Instruction: ****APPLY TO THE IRRITATED SKIN AT UPPER PORTION OF LOWER LEG.**** Primary Dressing UrgoClean AG 4 X 5 (in/in) Discharge Instruction: Apply directly to wound bed. Secondary Dressing Optifoam Non-Adhesive Dressing, 4x4 in Discharge Instruction: Apply  medial and anterior lower leg with compression wrap. Woven Gauze Sponge, Non-Sterile 4x4 in Discharge Instruction: Apply over primary dressing as directed. Zetuvit Plus 4x8 in Discharge Instruction: Apply over primary  dressing as directed. Secured With Compression Wrap Urgo K2 Lite, two layer compression system, regular Discharge Instruction: Apply Urgo K2 Lite as directed (alternative to 3 layer compression). Compression Stockings Add-Ons Electronic Signature(s) Signed: 05/12/2022 2:39:11 PM By: Karl Ito Entered By: Karl Ito on 05/11/2022 15:38:44 -------------------------------------------------------------------------------- Vitals Details Patient Name: Date of Service: Pontarelli, [name] 05/11/2022 3:00 PM Medical Record Number: [id number] Patient Account Number: [id number] Date of Birth/Sex: Treating RN: [birth year] (67 y.o. M) Primary Care Adelin Ventrella: [name] Other Clinician: Referring Jannie Doyle: Treating Deandre Stansel/Extender: Alver Fisher in Treatment: 40 Vital Signs Time Taken: 15:34 Temperature (F): 98.2 Height (in): 71 Pulse (bpm): 92 Weight (lbs): 350 Respiratory Rate (breaths/min): 18 Body Mass Index (BMI): 48.8 Blood Pressure (mmHg): 120/74 Reference Range: 80 - 120 mg / dl Electronic Signature(s) Signed: 05/11/2022 4:40:31 PM By: Thayer Dallas Entered By: Thayer Dallas on 05/11/2022 15:34:57

## 2022-05-16 ENCOUNTER — Telehealth (HOSPITAL_COMMUNITY): Payer: Self-pay | Admitting: Licensed Clinical Social Worker

## 2022-05-16 NOTE — Telephone Encounter (Signed)
H&V Care Navigation CSW Progress Note  Clinical Social Worker  received call from pt reported he hasn't heard regarding LIS program and he is now out of Eliquis.  CSW placed 1 month of samples at front desk for pt to pick up.   SDOH Screenings   Food Insecurity: No Food Insecurity (09/20/2021)  Housing: Low Risk  (09/20/2021)  Transportation Needs: No Transportation Needs (09/20/2021)  Utilities: Not At Risk (09/20/2021)  Depression (PHQ2-9): Low Risk  (10/05/2020)  Tobacco Use: Medium Risk (05/02/2022)    Burna Sis, LCSW Clinical Social Worker [facility] Desk#: [phone] Cell#: [phone]

## 2022-05-18 ENCOUNTER — Encounter (HOSPITAL_BASED_OUTPATIENT_CLINIC_OR_DEPARTMENT_OTHER): Payer: Medicare Other | Admitting: Physician Assistant

## 2022-05-18 DIAGNOSIS — L97518 Non-pressure chronic ulcer of other part of right foot with other specified severity: Secondary | ICD-10-CM | POA: Diagnosis not present

## 2022-05-18 DIAGNOSIS — E11621 Type 2 diabetes mellitus with foot ulcer: Secondary | ICD-10-CM | POA: Diagnosis not present

## 2022-05-18 DIAGNOSIS — E11622 Type 2 diabetes mellitus with other skin ulcer: Secondary | ICD-10-CM | POA: Diagnosis not present

## 2022-05-18 DIAGNOSIS — L97312 Non-pressure chronic ulcer of right ankle with fat layer exposed: Secondary | ICD-10-CM | POA: Diagnosis not present

## 2022-05-18 DIAGNOSIS — I890 Lymphedema, not elsewhere classified: Secondary | ICD-10-CM | POA: Diagnosis not present

## 2022-05-18 DIAGNOSIS — L97818 Non-pressure chronic ulcer of other part of right lower leg with other specified severity: Secondary | ICD-10-CM | POA: Diagnosis not present

## 2022-05-18 DIAGNOSIS — L97828 Non-pressure chronic ulcer of other part of left lower leg with other specified severity: Secondary | ICD-10-CM | POA: Diagnosis not present

## 2022-05-18 DIAGNOSIS — I872 Venous insufficiency (chronic) (peripheral): Secondary | ICD-10-CM | POA: Diagnosis not present

## 2022-05-18 DIAGNOSIS — I87333 Chronic venous hypertension (idiopathic) with ulcer and inflammation of bilateral lower extremity: Secondary | ICD-10-CM | POA: Diagnosis not present

## 2022-05-18 NOTE — Progress Notes (Signed)
DELMONT, SHEROD ([id number]) [id number] Page 1 of 11 Visit Report for 05/18/2022 Chief Complaint Document Details Patient Name: Date of Service: [name], [name] 05/18/2022 3:00 PM Medical Record Number: [id number] Patient Account Number: [id number] Date of Birth/Sex: Treating RN: [birth year] (67 y.o. M) Primary Care Provider: [name] Other Clinician: Referring Provider: Treating Provider/Extender: Alver Fisher in Treatment: 78 Information Obtained from: Patient Chief Complaint 08/04/2021; patient returns to clinic with bilateral leg wounds as well as areas on the right foot Electronic Signature(s) Signed: 05/18/2022 3:24:42 PM By: Allen Derry PA-C Entered By: Allen Derry on 05/18/2022 15:24:41 -------------------------------------------------------------------------------- Debridement Details Patient Name: Date of Service: [name], [name] 05/18/2022 3:00 PM Medical Record Number: [id number] Patient Account Number: [id number] Date of Birth/Sex: Treating RN: [birth year] (67 y.o. Harlon Flor, Millard.Loa Primary Care Provider: [name] Other Clinician: Referring Provider: Treating Provider/Extender: Alver Fisher in Treatment: 41 Debridement Performed for Assessment: Wound #7 Right,Medial Malleolus Performed By: Physician Lenda Kelp, PA Debridement Type: Debridement Severity of Tissue Pre Debridement: Fat layer exposed Level of Consciousness (Pre-procedure): Awake and Alert Pre-procedure Verification/Time Out Yes - 15:28 Taken: Start Time: 15:29 Pain Control: Lidocaine 4% T opical Solution Percent of Wound Bed Debrided: 100% T Area Debrided (cm): otal 11.78 Tissue and other material debrided: Viable, Non-Viable, Callus, Slough, Subcutaneous, Skin: Dermis , Skin: Epidermis, Biofilm, Slough Level: Skin/Subcutaneous Tissue Debridement Description: Excisional Instrument: Curette Bleeding: Minimum Hemostasis  Achieved: Pressure End Time: 15:35 Procedural Pain: 0 Post Procedural Pain: 0 Response to Treatment: Procedure was tolerated well Level of Consciousness (Post- Awake and Alert procedure): Post Debridement Measurements of Total Wound Length: (cm) 5 Width: (cm) 3 Depth: (cm) 0.3 Volume: (cm) 3.534 Character of Wound/Ulcer Post Debridement: Improved Severity of Tissue Post Debridement: Fat layer exposed Post Procedure Diagnosis Same as Pre-procedure Electronic Signature(s) Signed: 05/18/2022 4:34:38 PM By: Allen Derry PA-C Signed: 05/18/2022 4:57:58 PM By: Shawn Stall RN, BSN [address], [name] ([id number]) [id number] Page 2 of 11 Entered By: Shawn Stall on 05/18/2022 15:36:20 -------------------------------------------------------------------------------- HPI Details Patient Name: Date of Service: [name], [name]. 05/18/2022 3:00 PM Medical Record Number: [id number] Patient Account Number: [id number] Date of Birth/Sex: Treating RN: 1955-07-29 (67 y.o. M) Primary Care Provider: [name] Other Clinician: Referring Provider: Treating Provider/Extender: Alver Fisher in Treatment: 13 History of Present Illness HPI Description: ADMISSION 03/22/2021 This is a 67 year old man with a past medical history significant for diabetes type 2, congestive heart failure, peripheral arterial disease, morbid obesity, venous insufficiency, and coronary artery disease. He has been followed by Dr. Loreta Ave in podiatry, who performed a transmetatarsal amputation on the left foot in August 2022. He had issues healing that wound, but based upon Dr. Kenna Gilbert notes, ultimately the TMA wound healed. During his recovery from that surgery, however, ulcers opened up over the DIP joint of the right second and third toe. These have apparently closed and reopened multiple times. It sounds like one of the issues has been moisture accumulation and maceration of the  tissues causing them to reopen. At his last visit with Dr. Loreta Ave, on March 01, 2021, there continues to be problems with moisture and he was referred to wound care for further evaluation and management. He had a formal aortogram with runoff performed prior to his TMA. The findings are copied here: Patient has inline flow to both feet with no significant flow-limiting lesion that would be amenable to percutaneous or open revascularization. He does have an  element of small vessel disease and has a short segment occlusion of the distal anterior tibial/dorsalis pedis artery on the left foot but does have posterior tibial artery flow. Would recommend management of wounds with amputation of toes 2 and 3 on the right foot if the wounds do not heal and deteriorate. Transmetatarsal amputation on the left side has as good a blood supply as it is going to get and hopefully this will heal in the future. Formal ABIs were done in January 2023. They are normal bilaterally. ABI Findings: +---------+------------------+-----+----------+--------+ Right Rt Pressure (mmHg)IndexWaveform Comment  +---------+------------------+-----+----------+--------+ Brachial 160     +---------+------------------+-----+----------+--------+ PTA 192 1.06 monophasic  +---------+------------------+-----+----------+--------+ DP 159 0.88 monophasic  +---------+------------------+-----+----------+--------+ Great T oe145 0.80    +---------+------------------+-----+----------+--------+ +--------+------------------+-----+---------+-------+ Left Lt Pressure (mmHg)IndexWaveform Comment +--------+------------------+-----+---------+-------+ [id number]     +--------+------------------+-----+---------+-------+ PTA 204 1.13 triphasic  +--------+------------------+-----+---------+-------+ DP 194 1.07 biphasic    +--------+------------------+-----+---------+-------+ +-------+-----------+-----------+------------+------------+ ABI/TBIT oday's ABIT oday's TBIPrevious ABIPrevious TBI +-------+-----------+-----------+------------+------------+ Right 1.06 0.8 1.26 0.65  +-------+-----------+-----------+------------+------------+ Left 1.13 amputation 1.15 amputation  +-------+-----------+-----------+------------+------------+ Previous ABI on 08/06/20 at [facility] Pedal pressures falsely elevated due to medial calcification. Summary: Right: Resting right ankle-brachial index is within normal range. The right toe-brachial index is normal. Left: Resting left ankle-brachial index is within normal range. READMISSION 08/04/2021 Mr. Dewaele is now a 67 year old man who I remember from this clinic many years ago I think he had a right lower extremity predominantly venous wound at the time. He was here for 1 visit in March of this year had wounds on his right second and third toes we apparently dressed them many and they healed so he did not come back. He is listed in [place] is being a diabetic although the patient denies this says he is verified it with his primary doctor. In any case over the last several weeks or so according the patient although these wounds look somewhat more chronic than that he has developed predominantly large wounds on the right medial and right lateral ankle smaller areas on the left leg and areas on the dorsal aspect of his right second and third toes. Its not clear how he has been dressing these. 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 ROCIO, ROME ([id number]) [id number] Page 3 of 11 The patient has known PAD. He had an angiogram in August 2022 at which time he had nonhealing wounds in both feet. On the left his major vessels in the thigh were all patent. He  had three-vessel patent to the level of the ankle. He had a very short occlusion in the left anterior tibial. On the right lower extremity the major vessels in his thigh were all patent. He had three-vessel runoff to the foot sluggish filling of the anterior tibial artery. He was felt to have some component of small vessel disease but nothing that was amenable or needed revascularization. It was recommended that he have amputation of the second and third toes on the right foot if they did not heal He has been following with Dr. Loreta Ave of podiatry. Dr. Loreta Ave got him juxta lite stockings although I do not think he had them on properly he has uncontrolled edema in both legs Past medical history includes type 2 diabetes [although the patient really denies this], left TMA in 2022,lower extremity wounds in fact attendance at this clinic in 2009-2010, A-fib on Eliquis, chronic venous insufficiency with secondary lymphedema history of non-Hodgkin's lymphoma. 08-11-2021  upon evaluation today patient presents for follow-up evaluation he was seen last Wednesday initially for inspection here in our clinic. With that being said he tells me that he unfortunately has been having a lot of drainage and is actually coming through his wrap. Fortunately I do not see any evidence of active infection locally or systemically at this time which is great news. No fevers, chills, nausea, vomiting, or diarrhea. With that being said there does appear to be some evidence of local infection based on what I am seeing today. 08-18-2021 upon evaluation today patient appears to be doing okay currently in regard to his wounds with that being said that he is doing much better but still has a long ways to go to get to where he wanted to be. I think the infection is significantly improved. He has another week of the antibiotic at this point. 08-25-2021 upon evaluation today patient's wounds are actually doing decently well he has erythema has  significantly improved. I think the cellulitis is under controlled I am going to continue him on 2 more weeks of the Levaquin at 500 mg this is a lower dose but I am hoping it will be better for him. 09-08-2021 upon evaluation today patient appears to be doing excellent in regard to his wounds. Since I last saw him he was actually in the hospital where he ended up having a pacemaker put in. Subsequently he tells me that he is actually doing quite well although they were unsure whether they were going to do it due to the fact that he had the wounds on his legs. And then I am glad they did anything seem to be doing well. 09-15-2021 upon evaluation patient's wounds are actually showing signs of improvement. The right side wounds do appear to have some need for sharp debridement today and I Ernie Hew go ahead and proceed with that. I think that if we get the wounds cleaned up he will actually show signs of continued improvement. I am also leaning towards switching to [facility] which I think will be a much better option for him. 09-22-2021 patient's wounds are showing signs of excellent improvement. I am actually extremely pleased with where we stand and I think that the patient is making great progress. There does not appear to be any signs of active infection. 09-29-2021 upon evaluation today patient appears to be doing excellent in regard to his wounds. He is actually tolerating the dressing changes without complication. Fortunately I see no evidence of active infection locally or systemically at this time which is great news and overall I am extremely pleased with where we stand currently. [id number] upon evaluation today patient actually appears to be doing excellent in regard to his wounds. The left leg is almost completely healed the right ankle is significantly smaller. Overall I am extremely pleased with where we stand at this point. I do not see any evidence of active infection at this time. 10-13-2021  upon evaluation today patient appears to be doing excellent in regard to his wounds. I really feel like he is making good progress here and I am very pleased in that regard. Fortunately I do not see any signs of active infection at this time. We are using the [name] topical antibiotic therapy. 10-20-2021 upon evaluation today patient appears to be doing well with regard to his wound on the right medial ankle region the left leg is almost completely healed. I am actually very pleased with where we stand today. 11-03-2021  upon evaluation today patient's wound actually is going require some sharp debridement but appears to be doing much better which is great news. Fortunately I do not see any signs of active infection at this time. 11-10-2021 upon evaluation today patient's wound is actually showing signs of excellent improvement. Fortunately I do not see any evidence of infection locally or systemically which is great news and overall I am extremely pleased with where we stand today. I do believe he is making good progress he does have his [facility] topical antibiotics with him here today. 11-24-2021 upon evaluation today patient actually showing signs of excellent improvement this appears to be doing much better. Fortunately I do not see any evidence of infection locally or systemically at this time. 12-01-2021 upon evaluation today patient appears to be doing well currently in regard to his wound. He has been tolerating the dressing changes without complication. Fortunately I do not see any evidence of active infection at this time which is great news and overall I am extremely pleased with where we stand today. 12-15-2021 upon evaluation today patient appears to be doing well with regard to his wound. He is showing signs of improvement is slow but nonetheless we are making improvements here. Fortunately I do not see any evidence of active infection locally nor systemically at this time. We are still  using the [facility] topical antibiotic over the open area only. 12-29-2021 upon evaluation today patient's wound actually is showing signs of excellent improvement. It has been 2 weeks since I perform any debridement and it definitely shows he has some tissue that needs to be cleaned away but I think we can do so quite easily and readily today. The good news is I do not see any signs of infection I think he is doing much better in that regard. Overall I am extremely pleased with where we stand. 01-12-2022 upon evaluation today patient appears to be doing well currently in regard to his wound although it is not getting significantly smaller it is also not getting any larger. Fortunately I do not see any evidence of infection locally nor systemically which is great news and overall I am extremely pleased with where things stand currently. 01-19-2022 upon evaluation today patient appears to be doing well currently in regard to his wound. The PolyMem actually seems to have done extremely well for him. Fortunately I do not see any signs of infection locally nor systemically at this time. 01-26-2022 upon evaluation today patient appears to be making progress. Fortunately there does not appear to be any signs of infection which is great news. No fevers, chills, nausea, vomiting, or diarrhea. With that being said this is very slow to heal and although it is smaller I still feel like we may want to try to do something to speed this up I think that a skin substitute could be beneficial, look into Kerecis. 02-02-2022 upon evaluation today patient's wound is actually showing signs of excellent improvement. I do not see any evidence of infection and overall I think that we are headed in the right direction. Fortunately I think that he is tolerating the dressing changes without complication. 02-09-2022 upon evaluation today patient appears to be doing well currently in regard to his wound. It does look a little bit  macerated but fortunately does not appear to be showing any signs of significant skin breakdown over the macerated area which is good news. Fortunately I do not see any evidence of active infection locally nor systemically at  this point which is great news. 02-23-2022 upon evaluation today patient appears to be doing well currently in regard to his wounds. In fact his area on the ankle is actually showing signs of healing quite nicely. We do have the Apligraf ready today I am hopeful this is going to speed things up and get this closed much more effectively and quickly. Fortunately I do not see any signs of active infection locally nor systemically at this time which is great news. AMRITPAL, HANDYSIDE ([id number]) [id number] Page 4 of 11 03-02-2022 upon evaluation today patient appears to be doing excellent in regard to his wound. He is actually been tolerating the dressing changes without complication the wound on the left medial lower extremity is doing quite well and Apligraf seems to have been extremely beneficial for him. I am extremely pleased with where we stand today. 03/16/2022: The wound measurements are smaller today. He has accumulation of a yellow crust around the edges and slough on the surface. There is a musty odor coming from the wound. He has been getting Apligraf. 03-30-2022 upon evaluation today patient appears to be making progress. In regard to his leg ulcer. This is slow but nonetheless the Apligraf has fed things up. Fortunately I do not see any evidence of infection locally nor systemically at this time. No fevers, chills, nausea, vomiting, or diarrhea. Patient is here for Apligraf #4 today. 04-13-2022 upon evaluation today patient appears to be doing a little bit more poorly in regard to his wound. He actually feels like his wrap may have been a little bit tight. Fortunately there does not appear to be any signs of active infection locally nor systemically  which is great news and I am pleased in that regard. 04-20-2022 upon evaluation today patient appears to be doing better in regard to his wound from the standpoint of swelling he is actually lost 33 pounds on torsemide in the past week his leg is significantly smaller compared to what it has been. Fortunately I do not see any signs of active infection locally nor systemically at this time. 04-27-2022 upon evaluation today patient's wound actually is showing signs of erythema and warmth around the edges of the wound I am actually concerned about the possibility of infection. I think we probably need to obtain a wound culture and also can recommend based on what we are seeing that we go ahead and have the patient continue to utilize the compression wrapping which I think has been of benefit. 05-04-2022 upon evaluation today patient appears to be doing well currently in regard to his wound. He has been tolerating the dressing changes without complication. Fortunately there does not appear to be any signs of active infection locally or systemically which is great news. 05-11-2022 upon evaluation today patient actually appears to be doing excellent. He has been tolerating the dressing changes we actually were trialing a new medication on him last week and this was the ergo clean Ag dressing. With that being said this actually seems to have done extremely well at this point. Fortunately I do not see any signs of active infection locally or systemically at this time. 05-18-2022 upon evaluation today patient appears to be doing okay in regard to his wound I do not see any signs of worsening also notes any signs of significant improvement. Fortunately I do believe that removing in the right direction in general. Electronic Signature(s) Signed: 05/18/2022 4:05:10 PM By: Allen Derry PA-C Entered By: Allen Derry on 05/18/2022  16:05:10 --------------------------------------------------------------------------------  Physical Exam Details Patient Name: Date of Service: [name], [name] 05/18/2022 3:00 PM Medical Record Number: [id number] Patient Account Number: [id number] Date of Birth/Sex: Treating RN: 1955-09-04 (67 y.o. M) Primary Care Provider: [name] Other Clinician: Referring Provider: Treating Provider/Extender: Alver Fisher in Treatment: 26 Constitutional Well-nourished and well-hydrated in no acute distress. Respiratory normal breathing without difficulty. Psychiatric this patient is able to make decisions and demonstrates good insight into disease process. Alert and Oriented x 3. pleasant and cooperative. Notes Upon inspection patient's wound bed showed signs of good granulation epithelization at this point. Fortunately I do not see notes of infection locally nor systemically which is great news and overall I do believe that we are making good progress. I think that the patient is doing extremely well with regard to his wound but again is just very slow to close. We may want to switch to endoform I would like to give it 1 more shot with the any evidence trial that we are doing with the Urgo dressing today. Electronic Signature(s) Signed: 05/18/2022 4:05:28 PM By: Allen Derry PA-C Entered By: Allen Derry on 05/18/2022 16:05:27 -------------------------------------------------------------------------------- Physician Orders Details Patient Name: Date of Service: Vanhise, [name] 05/18/2022 3:00 PM Medical Record Number: [id number] Patient Account Number: [id number] Date of Birth/Sex: Treating RN: 01/29/1955 (67 y.o. [name] Primary Care Provider: [name] Other Clinician: Referring Provider: Treating Provider/Extender: Alver Fisher in Treatment: 73 Verbal / Phone Orders: No KARAM, GOGUEN ([id number]) [id number]  Page 5 of 11 Diagnosis Coding ICD-10 Coding Code Description I89.0 Lymphedema, not elsewhere classified I87.333 Chronic venous hypertension (idiopathic) with ulcer and inflammation of bilateral lower extremity L97.828 Non-pressure chronic ulcer of other part of left lower leg with other specified severity L97.818 Non-pressure chronic ulcer of other part of right lower leg with other specified severity E11.621 Type 2 diabetes mellitus with foot ulcer L97.518 Non-pressure chronic ulcer of other part of right foot with other specified severity Follow-up Appointments ppointment in 1 week. Leonard Schwartz Wednesday 05/25/2022 1500 room 8 Return A ppointment in 2 weeks. Leonard Schwartz Wednesday 06/01/2022 1500 room 8 Return A Anesthetic (In clinic) Topical Lidocaine 5% applied to wound bed Cellular or Tissue Based Products Cellular or Tissue Based Product Type: - Run IVR for Kerrecis- denied #1 Apligraf applied 02/23/2022 #2 Apligraf applied 03/02/2022 #3 Apligraf applied 03/16/2022 #4 Apligraf applied 03/30/2022 HOLD APLIGRAF this week. Cellular or Tissue Based Product applied to wound bed, secured with steri-strips, cover with Adaptic or Mepitel. (DO NOT REMOVE). Bathing/ Shower/ Hygiene May shower with protection but do not get wound dressing(s) wet. Protect dressing(s) with water repellant cover (for example, large plastic bag) or a cast cover and may then take shower. Edema Control - Lymphedema / SCD / Other Elevate legs to the level of the heart or above for 30 minutes daily and/or when sitting for 3-4 times a day throughout the day. Avoid standing for long periods of time. Exercise regularly Moisturize legs daily. - apply every night before bed to left leg. Compression stocking or Garment 30-40 mm/Hg pressure to: - wear your VIVE compression stocking to left leg. apply in the morning and remove at night. Wound Treatment Wound #7 - Malleolus Wound Laterality: Right, Medial Peri-Wound Care: Zinc Oxide  Ointment 30g tube 1 x Per Week/30 Days Discharge Instructions: Apply Zinc Oxide to periwound with each dressing change Peri-Wound Care: Sween Lotion (Moisturizing lotion) 1 x Per Week/30 Days Discharge Instructions: Apply  moisturizing lotion as directed Topical: ketoconazole and TCA cream 1 x Per Week/30 Days Discharge Instructions: ****APPLY TO THE IRRITATED SKIN AT UPPER PORTION OF LOWER LEG.**** Prim Dressing: UrgoClean AG 4 X 5 (in/in) 1 x Per Week/30 Days ary Discharge Instructions: Apply directly to wound bed. Secondary Dressing: Optifoam Non-Adhesive Dressing, 4x4 in 1 x Per Week/30 Days Discharge Instructions: Apply medial and anterior lower leg with compression wrap. Secondary Dressing: Woven Gauze Sponge, Non-Sterile 4x4 in 1 x Per Week/30 Days Discharge Instructions: Apply over primary dressing as directed. Secondary Dressing: Zetuvit Plus 4x8 in 1 x Per Week/30 Days Discharge Instructions: Apply over primary dressing as directed. Compression Wrap: Urgo K2 Lite, two layer compression system, regular 1 x Per Week/30 Days Discharge Instructions: Apply Urgo K2 Lite as directed (alternative to 3 layer compression). Electronic Signature(s) Signed: 05/18/2022 4:34:38 PM By: Allen Derry PA-C Signed: 05/18/2022 4:57:58 PM By: Shawn Stall RN, BSN Entered By: Shawn Stall on 05/18/2022 15:40:50 Clipper, [name] ([id number]) [id number] Page 6 of 11 -------------------------------------------------------------------------------- Problem List Details Patient Name: Date of Service: [name], [name] 05/18/2022 3:00 PM Medical Record Number: [id number] Patient Account Number: [id number] Date of Birth/Sex: Treating RN: 1955-12-30 (66 y.o. M) Primary Care Provider: [name] Other Clinician: Referring Provider: Treating Provider/Extender: Alver Fisher in Treatment: 30 Active Problems ICD-10 Encounter Code Description Active Date  MDM Diagnosis I89.0 Lymphedema, not elsewhere classified 08/04/2021 No Yes I87.333 Chronic venous hypertension (idiopathic) with ulcer and inflammation of 08/04/2021 No Yes bilateral lower extremity L97.828 Non-pressure chronic ulcer of other part of left lower leg with other specified 08/04/2021 No Yes severity L97.818 Non-pressure chronic ulcer of other part of right lower leg with other specified 08/04/2021 No Yes severity E11.621 Type 2 diabetes mellitus with foot ulcer 08/04/2021 No Yes L97.518 Non-pressure chronic ulcer of other part of right foot with other specified 08/04/2021 No Yes severity Inactive Problems Resolved Problems Electronic Signature(s) Signed: 05/18/2022 3:16:50 PM By: Allen Derry PA-C Entered By: Allen Derry on 05/18/2022 15:16:50 -------------------------------------------------------------------------------- Progress Note Details Patient Name: Date of Service: [name], [name] 05/18/2022 3:00 PM Medical Record Number: [id number] Patient Account Number: [id number] Date of Birth/Sex: Treating RN: 11/24/1955 (67 y.o. M) Primary Care Provider: [name] Other Clinician: Referring Provider: Treating Provider/Extender: Alver Fisher in Treatment: 29 Subjective Chief Complaint Information obtained from Patient 08/04/2021; patient returns to clinic with bilateral leg wounds as well as areas on the right foot History of Present Illness (HPI) ADMISSION 03/22/2021 This is a 67 year old man with a past medical history significant for diabetes type 2, congestive heart failure, peripheral arterial disease, morbid obesity, venous insufficiency, and coronary artery disease. He has been followed by Dr. Loreta Ave in podiatry, who performed a transmetatarsal amputation on the left [name], [name] ([id number]) [id number] Page 7 of 11 foot in August 2022. He had issues healing that wound, but based upon Dr. Kenna Gilbert notes, ultimately the  TMA wound healed. During his recovery from that surgery, however, ulcers opened up over the DIP joint of the right second and third toe. These have apparently closed and reopened multiple times. It sounds like one of the issues has been moisture accumulation and maceration of the tissues causing them to reopen. At his last visit with Dr. Loreta Ave, on March 01, 2021, there continues to be problems with moisture and he was referred to wound care for further evaluation and management. He had a formal aortogram with runoff performed prior to his TMA. The  findings are copied here: Patient has inline flow to both feet with no significant flow-limiting lesion that would be amenable to percutaneous or open revascularization. He does have an element of small vessel disease and has a short segment occlusion of the distal anterior tibial/dorsalis pedis artery on the left foot but does have posterior tibial artery flow. Would recommend management of wounds with amputation of toes 2 and 3 on the right foot if the wounds do not heal and deteriorate. Transmetatarsal amputation on the left side has as good a blood supply as it is going to get and hopefully this will heal in the future. Formal ABIs were done in January 2023. They are normal bilaterally. ABI Findings: +---------+------------------+-----+----------+--------+ Right Rt Pressure (mmHg)IndexWaveform Comment  +---------+------------------+-----+----------+--------+ Brachial 160    +---------+------------------+-----+----------+--------+ PTA 192 1.06 monophasic  +---------+------------------+-----+----------+--------+ DP 159 0.88 monophasic  +---------+------------------+-----+----------+--------+ Great T oe145 0.80    +---------+------------------+-----+----------+--------+ +--------+------------------+-----+---------+-------+ Left Lt Pressure (mmHg)IndexWaveform  Comment +--------+------------------+-----+---------+-------+ [id number]    +--------+------------------+-----+---------+-------+ PTA 204 1.13 triphasic  +--------+------------------+-----+---------+-------+ DP 194 1.07 biphasic   +--------+------------------+-----+---------+-------+ +-------+-----------+-----------+------------+------------+ ABI/TBIT oday's ABIT oday's TBIPrevious ABIPrevious TBI +-------+-----------+-----------+------------+------------+ Right 1.06 0.8 1.26 0.65  +-------+-----------+-----------+------------+------------+ Left 1.13 amputation 1.15 amputation  +-------+-----------+-----------+------------+------------+ Previous ABI on 08/06/20 at [facility] Pedal pressures falsely elevated due to medial calcification. Summary: Right: Resting right ankle-brachial index is within normal range. The right toe-brachial index is normal. Left: Resting left ankle-brachial index is within normal range. READMISSION 08/04/2021 Mr. [name] is now a 67 year old man who I remember from this clinic many years ago I think he had a right lower extremity predominantly venous wound at the time. He was here for 1 visit in March of this year had wounds on his right second and third toes we apparently dressed them many and they healed so he did not come back. He is listed in [place] is being a diabetic although the patient denies this says he is verified it with his primary doctor. In any case over the last several weeks or so according the patient although these wounds look somewhat more chronic than that he has developed predominantly large wounds on the right medial and right lateral ankle smaller areas on the left leg and areas on the dorsal aspect of his right second and third toes. Its not clear how he has been dressing these. 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 The patient has known PAD. He had an angiogram in August 2022 at which time he had nonhealing wounds in both feet. On the left his major vessels in the thigh were all patent. He had three-vessel patent to the level of the ankle. He had a very short occlusion in the left anterior tibial. On the right lower extremity the major vessels in his thigh were all patent. He had three-vessel runoff to the foot sluggish filling of the anterior tibial artery. He was felt to have some component of small vessel disease but nothing that was amenable or needed revascularization. It was recommended that he have amputation of the second and third toes on the right foot if they did not heal He has been following with Dr. Loreta Ave of podiatry. Dr. Loreta Ave got him juxta lite stockings although I do not think he had them on properly he has uncontrolled edema in both legs Past medical history includes type 2 diabetes [although the patient really denies this], left TMA in 2022,lower extremity wounds in fact attendance at  this clinic in 2009-2010, A-fib on Eliquis, chronic venous insufficiency with secondary lymphedema history of non-Hodgkin's lymphoma. 08-11-2021 upon evaluation today patient presents for follow-up evaluation he was seen last Wednesday initially for inspection here in our clinic. With that being said he tells me that he unfortunately has been having a lot of drainage and is actually coming through his wrap. Fortunately I do not see any evidence of active infection locally or systemically at this time which is great news. No fevers, chills, nausea, vomiting, or diarrhea. With that being said there does appear to be some evidence of local infection based on what I am seeing today. 08-18-2021 upon evaluation today patient appears to be doing okay currently in regard to his wounds with that being said that he is doing much better but still has a long ways to go to get to where he wanted to be. I think the  infection is significantly improved. He has another week of the antibiotic at this point. 08-25-2021 upon evaluation today patient's wounds are actually doing decently well he has erythema has significantly improved. I think the cellulitis is under controlled I am going to continue him on 2 more weeks of the Levaquin at 500 mg this is a lower dose but I am hoping it will be better for him. 09-08-2021 upon evaluation today patient appears to be doing excellent in regard to his wounds. Since I last saw him he was actually in the hospital where he DEPAUL, [name] ([id number]) [id number] Page 8 of 11 ended up having a pacemaker put in. Subsequently he tells me that he is actually doing quite well although they were unsure whether they were going to do it due to the fact that he had the wounds on his legs. And then I am glad they did anything seem to be doing well. 09-15-2021 upon evaluation patient's wounds are actually showing signs of improvement. The right side wounds do appear to have some need for sharp debridement today and I Ernie Hew go ahead and proceed with that. I think that if we get the wounds cleaned up he will actually show signs of continued improvement. I am also leaning towards switching to [facility] which I think will be a much better option for him. 09-22-2021 patient's wounds are showing signs of excellent improvement. I am actually extremely pleased with where we stand and I think that the patient is making great progress. There does not appear to be any signs of active infection. 09-29-2021 upon evaluation today patient appears to be doing excellent in regard to his wounds. He is actually tolerating the dressing changes without complication. Fortunately I see no evidence of active infection locally or systemically at this time which is great news and overall I am extremely pleased with where we stand currently. 04-979 upon evaluation today patient actually  appears to be doing excellent in regard to his wounds. The left leg is almost completely healed the right ankle is significantly smaller. Overall I am extremely pleased with where we stand at this point. I do not see any evidence of active infection at this time. 10-13-2021 upon evaluation today patient appears to be doing excellent in regard to his wounds. I really feel like he is making good progress here and I am very pleased in that regard. Fortunately I do not see any signs of active infection at this time. We are using the [facility] topical antibiotic therapy. 10-20-2021 upon evaluation today patient appears to be doing well with regard  to his wound on the right medial ankle region the left leg is almost completely healed. I am actually very pleased with where we stand today. 11-03-2021 upon evaluation today patient's wound actually is going require some sharp debridement but appears to be doing much better which is great news. Fortunately I do not see any signs of active infection at this time. 11-10-2021 upon evaluation today patient's wound is actually showing signs of excellent improvement. Fortunately I do not see any evidence of infection locally or systemically which is great news and overall I am extremely pleased with where we stand today. I do believe he is making good progress he does have his [facility] topical antibiotics with him here today. 11-24-2021 upon evaluation today patient actually showing signs of excellent improvement this appears to be doing much better. Fortunately I do not see any evidence of infection locally or systemically at this time. 12-01-2021 upon evaluation today patient appears to be doing well currently in regard to his wound. He has been tolerating the dressing changes without complication. Fortunately I do not see any evidence of active infection at this time which is great news and overall I am extremely pleased with where we stand today. 12-15-2021 upon  evaluation today patient appears to be doing well with regard to his wound. He is showing signs of improvement is slow but nonetheless we are making improvements here. Fortunately I do not see any evidence of active infection locally nor systemically at this time. We are still using the [facility] topical antibiotic over the open area only. 12-29-2021 upon evaluation today patient's wound actually is showing signs of excellent improvement. It has been 2 weeks since I perform any debridement and it definitely shows he has some tissue that needs to be cleaned away but I think we can do so quite easily and readily today. The good news is I do not see any signs of infection I think he is doing much better in that regard. Overall I am extremely pleased with where we stand. 01-12-2022 upon evaluation today patient appears to be doing well currently in regard to his wound although it is not getting significantly smaller it is also not getting any larger. Fortunately I do not see any evidence of infection locally nor systemically which is great news and overall I am extremely pleased with where things stand currently. 01-19-2022 upon evaluation today patient appears to be doing well currently in regard to his wound. The PolyMem actually seems to have done extremely well for him. Fortunately I do not see any signs of infection locally nor systemically at this time. 01-26-2022 upon evaluation today patient appears to be making progress. Fortunately there does not appear to be any signs of infection which is great news. No fevers, chills, nausea, vomiting, or diarrhea. With that being said this is very slow to heal and although it is smaller I still feel like we may want to try to do something to speed this up I think that a skin substitute could be beneficial, look into Kerecis. 02-02-2022 upon evaluation today patient's wound is actually showing signs of excellent improvement. I do not see any evidence of infection  and overall I think that we are headed in the right direction. Fortunately I think that he is tolerating the dressing changes without complication. 02-09-2022 upon evaluation today patient appears to be doing well currently in regard to his wound. It does look a little bit macerated but fortunately does not appear to be showing any  signs of significant skin breakdown over the macerated area which is good news. Fortunately I do not see any evidence of active infection locally nor systemically at this point which is great news. 02-23-2022 upon evaluation today patient appears to be doing well currently in regard to his wounds. In fact his area on the ankle is actually showing signs of healing quite nicely. We do have the Apligraf ready today I am hopeful this is going to speed things up and get this closed much more effectively and quickly. Fortunately I do not see any signs of active infection locally nor systemically at this time which is great news. 03-02-2022 upon evaluation today patient appears to be doing excellent in regard to his wound. He is actually been tolerating the dressing changes without complication the wound on the left medial lower extremity is doing quite well and Apligraf seems to have been extremely beneficial for him. I am extremely pleased with where we stand today. 03/16/2022: The wound measurements are smaller today. He has accumulation of a yellow crust around the edges and slough on the surface. There is a musty odor coming from the wound. He has been getting Apligraf. 03-30-2022 upon evaluation today patient appears to be making progress. In regard to his leg ulcer. This is slow but nonetheless the Apligraf has fed things up. Fortunately I do not see any evidence of infection locally nor systemically at this time. No fevers, chills, nausea, vomiting, or diarrhea. Patient is here for Apligraf #4 today. 04-13-2022 upon evaluation today patient appears to be doing a little bit more  poorly in regard to his wound. He actually feels like his wrap may have been a little bit tight. Fortunately there does not appear to be any signs of active infection locally nor systemically which is great news and I am pleased in that regard. 04-20-2022 upon evaluation today patient appears to be doing better in regard to his wound from the standpoint of swelling he is actually lost 33 pounds on torsemide in the past week his leg is significantly smaller compared to what it has been. Fortunately I do not see any signs of active infection locally nor systemically at this time. 04-27-2022 upon evaluation today patient's wound actually is showing signs of erythema and warmth around the edges of the wound I am actually concerned about the possibility of infection. I think we probably need to obtain a wound culture and also can recommend based on what we are seeing that we go ahead and have the patient continue to utilize the compression wrapping which I think has been of benefit. 05-04-2022 upon evaluation today patient appears to be doing well currently in regard to his wound. He has been tolerating the dressing changes without complication. Fortunately there does not appear to be any signs of active infection locally or systemically which is great news. DUEY, BEHRMAN ([id number]) [id number] Page 9 of 11 05-11-2022 upon evaluation today patient actually appears to be doing excellent. He has been tolerating the dressing changes we actually were trialing a new medication on him last week and this was the ergo clean Ag dressing. With that being said this actually seems to have done extremely well at this point. Fortunately I do not see any signs of active infection locally or systemically at this time. 05-18-2022 upon evaluation today patient appears to be doing okay in regard to his wound I do not see any signs of worsening also notes any signs of significant improvement. Fortunately  I  do believe that removing in the right direction in general. Objective Constitutional Well-nourished and well-hydrated in no acute distress. Vitals Time Taken: 2:57 PM, Height: 71 in, Weight: 350 lbs, BMI: 48.8, Temperature: 98.1 F, Pulse: 78 bpm, Respiratory Rate: 18 breaths/min, Blood Pressure: 149/89 mmHg. Respiratory normal breathing without difficulty. Psychiatric this patient is able to make decisions and demonstrates good insight into disease process. Alert and Oriented x 3. pleasant and cooperative. General Notes: Upon inspection patient's wound bed showed signs of good granulation epithelization at this point. Fortunately I do not see notes of infection locally nor systemically which is great news and overall I do believe that we are making good progress. I think that the patient is doing extremely well with regard to his wound but again is just very slow to close. We may want to switch to endoform I would like to give it 1 more shot with the any evidence trial that we are doing with the Urgo dressing today. Integumentary (Hair, Skin) Wound #7 status is Open. Original cause of wound was Blister. The date acquired was: 07/21/2021. The wound has been in treatment 41 weeks. The wound is located on the Right,Medial Malleolus. The wound measures 4.5cm length x 2cm width x 0.3cm depth; 7.069cm^2 area and 2.121cm^3 volume. There is no tunneling or undermining noted. There is a medium amount of serosanguineous drainage noted. There is large (67-100%) granulation within the wound bed. There is a small (1-33%) amount of necrotic tissue within the wound bed. The periwound skin appearance exhibited: Callus. Assessment Active Problems ICD-10 Lymphedema, not elsewhere classified Chronic venous hypertension (idiopathic) with ulcer and inflammation of bilateral lower extremity Non-pressure chronic ulcer of other part of left lower leg with other specified severity Non-pressure chronic ulcer of  other part of right lower leg with other specified severity Type 2 diabetes mellitus with foot ulcer Non-pressure chronic ulcer of other part of right foot with other specified severity Procedures Wound #7 Pre-procedure diagnosis of Wound #7 is a Venous Leg Ulcer located on the Right,Medial Malleolus .Severity of Tissue Pre Debridement is: Fat layer exposed. There was a Excisional Skin/Subcutaneous Tissue Debridement with a total area of 11.78 sq cm performed by Lenda Kelp, PA. With the following instrument(s): Curette to remove Viable and Non-Viable tissue/material. Material removed includes Callus, Subcutaneous Tissue, Slough, Skin: Dermis, Skin: Epidermis, and Biofilm after achieving pain control using Lidocaine 4% Topical Solution. A time out was conducted at 15:28, prior to the start of the procedure. A Minimum amount of bleeding was controlled with Pressure. The procedure was tolerated well with a pain level of 0 throughout and a pain level of 0 following the procedure. Post Debridement Measurements: 5cm length x 3cm width x 0.3cm depth; 3.534cm^3 volume. Character of Wound/Ulcer Post Debridement is improved. Severity of Tissue Post Debridement is: Fat layer exposed. Post procedure Diagnosis Wound #7: Same as Pre-Procedure Pre-procedure diagnosis of Wound #7 is a Venous Leg Ulcer located on the Right,Medial Malleolus . There was a Double Layer Compression Therapy Procedure by Shawn Stall, RN. Post procedure Diagnosis Wound #7: Same as Pre-Procedure Plan Follow-up Appointments: [name], [name] ([id number]) [id number] Page 10 of 11 Return Appointment in 1 week. Leonard Schwartz Wednesday 05/25/2022 1500 room 8 Return Appointment in 2 weeks. Leonard Schwartz Wednesday 06/01/2022 1500 room 8 Anesthetic: (In clinic) Topical Lidocaine 5% applied to wound bed Cellular or Tissue Based Products: Cellular or Tissue Based Product Type: - Run IVR for Kerrecis- denied #1 Apligraf applied  02/23/2022 #2 Apligraf  applied 03/02/2022 #3 Apligraf applied 03/16/2022 #4 Apligraf applied 03/30/2022 HOLD APLIGRAF this week. Cellular or Tissue Based Product applied to wound bed, secured with steri-strips, cover with Adaptic or Mepitel. (DO NOT REMOVE). Bathing/ Shower/ Hygiene: May shower with protection but do not get wound dressing(s) wet. Protect dressing(s) with water repellant cover (for example, large plastic bag) or a cast cover and may then take shower. Edema Control - Lymphedema / SCD / Other: Elevate legs to the level of the heart or above for 30 minutes daily and/or when sitting for 3-4 times a day throughout the day. Avoid standing for long periods of time. Exercise regularly Moisturize legs daily. - apply every night before bed to left leg. Compression stocking or Garment 30-40 mm/Hg pressure to: - wear your VIVE compression stocking to left leg. apply in the morning and remove at night. WOUND #7: - Malleolus Wound Laterality: Right, Medial Peri-Wound Care: Zinc Oxide Ointment 30g tube 1 x Per Week/30 Days Discharge Instructions: Apply Zinc Oxide to periwound with each dressing change Peri-Wound Care: Sween Lotion (Moisturizing lotion) 1 x Per Week/30 Days Discharge Instructions: Apply moisturizing lotion as directed Topical: ketoconazole and TCA cream 1 x Per Week/30 Days Discharge Instructions: ****APPLY TO THE IRRITATED SKIN AT UPPER PORTION OF LOWER LEG.**** Prim Dressing: UrgoClean AG 4 X 5 (in/in) 1 x Per Week/30 Days ary Discharge Instructions: Apply directly to wound bed. Secondary Dressing: Optifoam Non-Adhesive Dressing, 4x4 in 1 x Per Week/30 Days Discharge Instructions: Apply medial and anterior lower leg with compression wrap. Secondary Dressing: Woven Gauze Sponge, Non-Sterile 4x4 in 1 x Per Week/30 Days Discharge Instructions: Apply over primary dressing as directed. Secondary Dressing: Zetuvit Plus 4x8 in 1 x Per Week/30 Days Discharge Instructions: Apply over  primary dressing as directed. Com pression Wrap: Urgo K2 Lite, two layer compression system, regular 1 x Per Week/30 Days Discharge Instructions: Apply Urgo K2 Lite as directed (alternative to 3 layer compression). 1. I would recommend currently that we continue with the ergo clean Ag which I think is doing a good job here 2. I am good recommend as well that the patient should continue with the compression wrapping. We are using the Urgo K2 compression which is the light version. We will see patient back for reevaluation in 1 week here in the clinic. If anything worsens or changes patient will contact our office for additional recommendations. If were not seeing good improvement come next week we will switch to a endoform dressing as likely the next choice. Electronic Signature(s) Signed: 05/18/2022 4:06:14 PM By: Allen Derry PA-C Entered By: Allen Derry on 05/18/2022 16:06:14 -------------------------------------------------------------------------------- SuperBill Details Patient Name: Date of Service: [name], [name] 05/18/2022 Medical Record Number: [id number] Patient Account Number: [id number] Date of Birth/Sex: Treating RN: March 31, 1955 (67 y.o. Harlon Flor, Millard.Loa Primary Care Provider: [name] Other Clinician: Referring Provider: Treating Provider/Extender: Alver Fisher in Treatment: 41 Diagnosis Coding ICD-10 Codes Code Description I89.0 Lymphedema, not elsewhere classified I87.333 Chronic venous hypertension (idiopathic) with ulcer and inflammation of bilateral lower extremity L97.828 Non-pressure chronic ulcer of other part of left lower leg with other specified severity L97.818 Non-pressure chronic ulcer of other part of right lower leg with other specified severity E11.621 Type 2 diabetes mellitus with foot ulcer L97.518 Non-pressure chronic ulcer of other part of right foot with other specified severity Facility Procedures Physician Procedures : CPT4  Code Description Modifier [id number] 11042 - WC PHYS SUBQ TISS 20 SQ CM ICD-10 Diagnosis Description L97.818  Non-pressure chronic ulcer of other part of right lower leg with other specified severity Quantity: 1 Electronic Signature(s) Signed: 05/18/2022 4:06:35 PM By: Allen Derry PA-C Entered By: Allen Derry on 05/18/2022 16:06:35

## 2022-05-19 ENCOUNTER — Other Ambulatory Visit: Payer: Medicare Other

## 2022-05-19 NOTE — Progress Notes (Signed)
DRAVYN, REDWOOD ([id number]) [id number] Page 1 of 5 Visit Report for 05/18/2022 Arrival Information Details Patient Name: Date of Service: [name], [name] 05/18/2022 3:00 PM Medical Record Number: [id number] Patient Account Number: [id number] Date of Birth/Sex: Treating RN: June 19, 1955 (67 y.o. M) Primary Care Aqil Goetting: [name] Other Clinician: Referring Davarion Cuffee: Treating Shervon Kerwin/Extender: Alver Fisher in Treatment: 41 Visit Information History Since Last Visit Added or deleted any medications: No Patient Arrived: [name] Any new allergies or adverse reactions: No Arrival Time: 14:53 Had a fall or experienced change in No Accompanied By: self activities of daily living that may affect Transfer Assistance: None risk of falls: Patient Identification Verified: Yes Signs or symptoms of abuse/neglect since last visito No Secondary Verification Process Completed: Yes Hospitalized since last visit: No Patient Requires Transmission-Based Precautions: No Implantable device outside of the clinic excluding No Patient Has Alerts: Yes cellular tissue based products placed in the center Patient Alerts: Patient on Blood Thinner since last visit: 01/2021 ABI L 1.13 R 1.06 Has Compression in Place as Prescribed: Yes 01/2021 TBI L amp R 0.8 Pain Present Now: No Electronic Signature(s) Signed: 05/18/2022 4:18:40 PM By: Thayer Dallas Entered By: Thayer Dallas on 05/18/2022 14:57:10 -------------------------------------------------------------------------------- Compression Therapy Details Patient Name: Date of Service: [name], [name] [name] 05/18/2022 3:00 PM Medical Record Number: [id number] Patient Account Number: [id number] Date of Birth/Sex: Treating RN: 16-Aug-1955 (67 y.o. [name] Primary Care Jordin Dambrosio: [name] Other Clinician: Referring Samora Jernberg: Treating Karry Barrilleaux/Extender: Alver Fisher in Treatment:  41 Compression Therapy Performed for Wound Assessment: Wound #7 Right,Medial Malleolus Performed By: Clinician Shawn Stall, RN Compression Type: Double Layer Post Procedure Diagnosis Same as Pre-procedure Electronic Signature(s) Signed: 05/18/2022 4:57:58 PM By: Shawn Stall RN, BSN Entered By: Shawn Stall on 05/18/2022 15:38:27 -------------------------------------------------------------------------------- Encounter Discharge Information Details Patient Name: Date of Service: [name], [name] 05/18/2022 3:00 PM Medical Record Number: [id number] Patient Account Number: [id number] Date of Birth/Sex: Treating RN: 10-30-1955 (67 y.o. [name] Primary Care Kalin Kyler: [name] Other Clinician: Referring Oberon Hehir: Treating Rayelle Armor/Extender: Alver Fisher in Treatment: [phone] Encounter Discharge Information Items Post Procedure Vitals Discharge Condition: Stable Temperature (F): 98.1 Ambulatory Status: Walker Pulse (bpm): 78 Discharge Destination: Home Respiratory Rate (breaths/min): 20 Transportation: Private Auto Blood Pressure (mmHg): 149/89 Accompanied By: self Herbert Spires ([id number]) [id number] Page 2 of 5 Schedule Follow-up Appointment: Yes Clinical Summary of Care: Electronic Signature(s) Signed: 05/18/2022 4:57:58 PM By: Shawn Stall RN, BSN Entered By: Shawn Stall on 05/18/2022 15:41:47 -------------------------------------------------------------------------------- Lower Extremity Assessment Details Patient Name: Date of Service: [name], [name] 05/18/2022 3:00 PM Medical Record Number: [id number] Patient Account Number: [id number] Date of Birth/Sex: Treating RN: [birth year] (67 y.o. M) Primary Care Elzada Pytel: [name] Other Clinician: Referring Celene Pippins: Treating Dioselina Brumbaugh/Extender: Alver Fisher in Treatment: 41 Edema Assessment Assessed: Kyra Searles: No] Franne Forts: No] Edema: [Left: N]  [Right: o] Calf Left: Right: Point of Measurement: 31 cm From Medial Instep 38.5 cm Ankle Left: Right: Point of Measurement: 11 cm From Medial Instep 25.7 cm Electronic Signature(s) Signed: 05/18/2022 4:18:40 PM By: Thayer Dallas Entered By: Thayer Dallas on 05/18/2022 15:06:01 -------------------------------------------------------------------------------- Multi-Disciplinary Care Plan Details Patient Name: Date of Service: [name], [name] 05/18/2022 3:00 PM Medical Record Number: [id number] Patient Account Number: [id number] Date of Birth/Sex: Treating RN: 12/21/1955 (67 y.o. [name] Primary Care Warner Laduca: [name] Other Clinician: Referring Ireene Ballowe: Treating Paxten Appelt/Extender: Alver Fisher in  Treatment: 41 Multidisciplinary Care Plan reviewed with physician Active Inactive Pain, Acute or Chronic Nursing Diagnoses: Pain, acute or chronic: actual or potential Potential alteration in comfort, pain Goals: Patient will verbalize adequate pain control and receive pain control interventions during procedures as needed Date Initiated: 08/04/2021 Target Resolution Date: 06/08/2022 Goal Status: Active Patient/caregiver will verbalize comfort level met Date Initiated: 08/04/2021 Target Resolution Date: 06/08/2022 Goal Status: Active Interventions: Encourage patient to take pain medications as prescribed Provide education on pain management Reposition patient for comfort [name], [name] ([id number]) [id number] Page 3 of 5 Treatment Activities: Administer pain control measures as ordered : 08/04/2021 Notes: Electronic Signature(s) Signed: 05/18/2022 4:57:58 PM By: Shawn Stall RN, BSN Entered By: Shawn Stall on 05/18/2022 15:32:52 -------------------------------------------------------------------------------- Pain Assessment Details Patient Name: Date of Service: [name], [name] 05/18/2022 3:00 PM Medical Record Number:  [id number] Patient Account Number: [id number] Date of Birth/Sex: Treating RN: [birth year] (67 y.o. M) Primary Care Jaston Havens: [name] Other Clinician: Referring Quantina Dershem: Treating Marvella Jenning/Extender: Alver Fisher in Treatment: [phone] Active Problems Location of Pain Severity and Description of Pain Patient Has Paino No Site Locations Pain Management and Medication Current Pain Management: Electronic Signature(s) Signed: 05/18/2022 4:18:40 PM By: Thayer Dallas Entered By: Thayer Dallas on 05/18/2022 14:57:49 -------------------------------------------------------------------------------- Patient/Caregiver Education Details Patient Name: Date of Service: [name], [name] 5/8/2024andnbsp3:00 PM Medical Record Number: [id number] Patient Account Number: [id number] Date of Birth/Gender: Treating RN: Dec 19, 1955 (66 y.o. [name] Primary Care Physician: [name] Other Clinician: Referring Physician: Treating Physician/Extender: Alver Fisher in Treatment: 73 Education Assessment Education Provided To: Patient Education Topics Provided Wound/Skin Impairment: Handouts: Caring for Your Ulcer SAFIR, TAULBEE ([id number]) [id number] Page 4 of 5 Methods: Explain/Verbal Responses: Reinforcements needed Electronic Signature(s) Signed: 05/18/2022 4:57:58 PM By: Shawn Stall RN, BSN Entered By: Shawn Stall on 05/18/2022 15:33:05 -------------------------------------------------------------------------------- Wound Assessment Details Patient Name: Date of Service: [name], [name] 05/18/2022 3:00 PM Medical Record Number: [id number] Patient Account Number: [id number] Date of Birth/Sex: Treating RN: 1955/09/20 (67 y.o. M) Primary Care Mavin Dyke: [name] Other Clinician: Referring Layliana Devins: Treating Penda Venturi/Extender: Alver Fisher in Treatment: 41 Wound Status Wound Number: 7 Primary  Venous Leg Ulcer Etiology: Wound Location: Right, Medial Malleolus Secondary Lymphedema Wounding Event: Blister Etiology: Date Acquired: 07/21/2021 Wound Open Weeks Of Treatment: 41 Status: Clustered Wound: No Comorbid Sleep Apnea, Arrhythmia, Congestive Heart Failure, History: Hypertension, Hypotension, Peripheral Arterial Disease, Peripheral Venous Disease, Type II Diabetes, Gout, Osteoarthritis Photos Wound Measurements Length: (cm) 4.5 Width: (cm) 2 Depth: (cm) 0.3 Area: (cm) 7.069 Volume: (cm) 2.121 % Reduction in Area: 81.5% % Reduction in Volume: 92.1% Epithelialization: Small (1-33%) Tunneling: No Undermining: No Wound Description Classification: Full Thickness With Exposed Suppor Exudate Amount: Medium Exudate Type: Serosanguineous Exudate Color: red, brown t Structures Wound Bed Granulation Amount: Large (67-100%) Necrotic Amount: Small (1-33%) Periwound Skin Texture Texture Color No Abnormalities Noted: No No Abnormalities Noted: No Callus: Yes Moisture No Abnormalities Noted: No Treatment Notes Wound #7 (Malleolus) Wound Laterality: Right, Medial Loyal, [name] ([id number]) [id number] Page 5 of 5 Cleanser Peri-Wound Care Zinc Oxide Ointment 30g tube Discharge Instruction: Apply Zinc Oxide to periwound with each dressing change Sween Lotion (Moisturizing lotion) Discharge Instruction: Apply moisturizing lotion as directed Topical ketoconazole and TCA cream Discharge Instruction: ****APPLY TO THE IRRITATED SKIN AT UPPER PORTION OF LOWER LEG.**** Primary Dressing UrgoClean AG 4 X 5 (in/in) Discharge Instruction: Apply directly to wound bed. Secondary Dressing Optifoam Non-Adhesive  Dressing, 4x4 in Discharge Instruction: Apply medial and anterior lower leg with compression wrap. Woven Gauze Sponge, Non-Sterile 4x4 in Discharge Instruction: Apply over primary dressing as directed. Zetuvit Plus 4x8 in Discharge  Instruction: Apply over primary dressing as directed. Secured With Compression Wrap Urgo K2 Lite, two layer compression system, regular Discharge Instruction: Apply Urgo K2 Lite as directed (alternative to 3 layer compression). Compression Stockings Add-Ons Electronic Signature(s) Signed: 05/18/2022 4:18:40 PM By: Thayer Dallas Entered By: Thayer Dallas on 05/18/2022 15:15:47 -------------------------------------------------------------------------------- Vitals Details Patient Name: Date of Service: [name], [name] 05/18/2022 3:00 PM Medical Record Number: [id number] Patient Account Number: [id number] Date of Birth/Sex: Treating RN: 12-24-1955 (67 y.o. M) Primary Care Felisia Balcom: [name] Other Clinician: Referring Milagros Middendorf: Treating Nuriyah Hanline/Extender: Alver Fisher in Treatment: 41 Vital Signs Time Taken: 14:57 Temperature (F): 98.1 Height (in): 71 Pulse (bpm): 78 Weight (lbs): 350 Respiratory Rate (breaths/min): 18 Body Mass Index (BMI): 48.8 Blood Pressure (mmHg): 149/89 Reference Range: 80 - 120 mg / dl Electronic Signature(s) Signed: 05/18/2022 4:18:40 PM By: Thayer Dallas Entered By: Thayer Dallas on 05/18/2022 14:57:38

## 2022-05-25 ENCOUNTER — Encounter (HOSPITAL_BASED_OUTPATIENT_CLINIC_OR_DEPARTMENT_OTHER): Payer: Medicare Other | Admitting: Physician Assistant

## 2022-05-25 DIAGNOSIS — L97518 Non-pressure chronic ulcer of other part of right foot with other specified severity: Secondary | ICD-10-CM | POA: Diagnosis not present

## 2022-05-25 DIAGNOSIS — L97818 Non-pressure chronic ulcer of other part of right lower leg with other specified severity: Secondary | ICD-10-CM | POA: Diagnosis not present

## 2022-05-25 DIAGNOSIS — I872 Venous insufficiency (chronic) (peripheral): Secondary | ICD-10-CM | POA: Diagnosis not present

## 2022-05-25 DIAGNOSIS — L97828 Non-pressure chronic ulcer of other part of left lower leg with other specified severity: Secondary | ICD-10-CM | POA: Diagnosis not present

## 2022-05-25 DIAGNOSIS — I890 Lymphedema, not elsewhere classified: Secondary | ICD-10-CM | POA: Diagnosis not present

## 2022-05-25 DIAGNOSIS — E11622 Type 2 diabetes mellitus with other skin ulcer: Secondary | ICD-10-CM | POA: Diagnosis not present

## 2022-05-25 DIAGNOSIS — E11621 Type 2 diabetes mellitus with foot ulcer: Secondary | ICD-10-CM | POA: Diagnosis not present

## 2022-05-25 DIAGNOSIS — I87333 Chronic venous hypertension (idiopathic) with ulcer and inflammation of bilateral lower extremity: Secondary | ICD-10-CM | POA: Diagnosis not present

## 2022-05-25 DIAGNOSIS — L97312 Non-pressure chronic ulcer of right ankle with fat layer exposed: Secondary | ICD-10-CM | POA: Diagnosis not present

## 2022-05-27 DIAGNOSIS — G4733 Obstructive sleep apnea (adult) (pediatric): Secondary | ICD-10-CM | POA: Diagnosis not present

## 2022-06-01 ENCOUNTER — Encounter (HOSPITAL_BASED_OUTPATIENT_CLINIC_OR_DEPARTMENT_OTHER): Payer: Medicare Other | Admitting: Physician Assistant

## 2022-06-01 DIAGNOSIS — I87331 Chronic venous hypertension (idiopathic) with ulcer and inflammation of right lower extremity: Secondary | ICD-10-CM | POA: Diagnosis not present

## 2022-06-01 DIAGNOSIS — L97818 Non-pressure chronic ulcer of other part of right lower leg with other specified severity: Secondary | ICD-10-CM | POA: Diagnosis not present

## 2022-06-01 DIAGNOSIS — L97312 Non-pressure chronic ulcer of right ankle with fat layer exposed: Secondary | ICD-10-CM | POA: Diagnosis not present

## 2022-06-01 DIAGNOSIS — E11621 Type 2 diabetes mellitus with foot ulcer: Secondary | ICD-10-CM | POA: Diagnosis not present

## 2022-06-01 DIAGNOSIS — I87333 Chronic venous hypertension (idiopathic) with ulcer and inflammation of bilateral lower extremity: Secondary | ICD-10-CM | POA: Diagnosis not present

## 2022-06-01 DIAGNOSIS — L97518 Non-pressure chronic ulcer of other part of right foot with other specified severity: Secondary | ICD-10-CM | POA: Diagnosis not present

## 2022-06-01 DIAGNOSIS — E11622 Type 2 diabetes mellitus with other skin ulcer: Secondary | ICD-10-CM | POA: Diagnosis not present

## 2022-06-01 DIAGNOSIS — L97828 Non-pressure chronic ulcer of other part of left lower leg with other specified severity: Secondary | ICD-10-CM | POA: Diagnosis not present

## 2022-06-01 DIAGNOSIS — I890 Lymphedema, not elsewhere classified: Secondary | ICD-10-CM | POA: Diagnosis not present

## 2022-06-08 ENCOUNTER — Encounter (HOSPITAL_BASED_OUTPATIENT_CLINIC_OR_DEPARTMENT_OTHER): Payer: Medicare Other | Admitting: Physician Assistant

## 2022-06-08 DIAGNOSIS — E11621 Type 2 diabetes mellitus with foot ulcer: Secondary | ICD-10-CM | POA: Diagnosis not present

## 2022-06-08 DIAGNOSIS — I890 Lymphedema, not elsewhere classified: Secondary | ICD-10-CM | POA: Diagnosis not present

## 2022-06-08 DIAGNOSIS — L97518 Non-pressure chronic ulcer of other part of right foot with other specified severity: Secondary | ICD-10-CM | POA: Diagnosis not present

## 2022-06-08 DIAGNOSIS — L97828 Non-pressure chronic ulcer of other part of left lower leg with other specified severity: Secondary | ICD-10-CM | POA: Diagnosis not present

## 2022-06-08 DIAGNOSIS — I87331 Chronic venous hypertension (idiopathic) with ulcer and inflammation of right lower extremity: Secondary | ICD-10-CM | POA: Diagnosis not present

## 2022-06-08 DIAGNOSIS — L97312 Non-pressure chronic ulcer of right ankle with fat layer exposed: Secondary | ICD-10-CM | POA: Diagnosis not present

## 2022-06-08 DIAGNOSIS — L97818 Non-pressure chronic ulcer of other part of right lower leg with other specified severity: Secondary | ICD-10-CM | POA: Diagnosis not present

## 2022-06-08 DIAGNOSIS — E11622 Type 2 diabetes mellitus with other skin ulcer: Secondary | ICD-10-CM | POA: Diagnosis not present

## 2022-06-08 DIAGNOSIS — I87333 Chronic venous hypertension (idiopathic) with ulcer and inflammation of bilateral lower extremity: Secondary | ICD-10-CM | POA: Diagnosis not present

## 2022-06-14 ENCOUNTER — Other Ambulatory Visit: Payer: Self-pay | Admitting: Pulmonary Disease

## 2022-06-14 ENCOUNTER — Other Ambulatory Visit (HOSPITAL_COMMUNITY): Payer: Self-pay | Admitting: Cardiology

## 2022-06-14 DIAGNOSIS — I4821 Permanent atrial fibrillation: Secondary | ICD-10-CM

## 2022-06-14 MED ORDER — APIXABAN 5 MG PO TABS: 5.0000 mg | ORAL_TABLET | Freq: Two times a day (BID) | ORAL | 1 refills | Status: DC

## 2022-06-15 ENCOUNTER — Encounter (HOSPITAL_BASED_OUTPATIENT_CLINIC_OR_DEPARTMENT_OTHER): Payer: Medicare Other | Attending: Physician Assistant | Admitting: Physician Assistant

## 2022-06-15 DIAGNOSIS — E11621 Type 2 diabetes mellitus with foot ulcer: Secondary | ICD-10-CM | POA: Insufficient documentation

## 2022-06-15 DIAGNOSIS — I2510 Atherosclerotic heart disease of native coronary artery without angina pectoris: Secondary | ICD-10-CM | POA: Insufficient documentation

## 2022-06-15 DIAGNOSIS — I872 Venous insufficiency (chronic) (peripheral): Secondary | ICD-10-CM | POA: Diagnosis not present

## 2022-06-15 DIAGNOSIS — L97818 Non-pressure chronic ulcer of other part of right lower leg with other specified severity: Secondary | ICD-10-CM | POA: Insufficient documentation

## 2022-06-15 DIAGNOSIS — L97828 Non-pressure chronic ulcer of other part of left lower leg with other specified severity: Secondary | ICD-10-CM | POA: Diagnosis not present

## 2022-06-15 DIAGNOSIS — I509 Heart failure, unspecified: Secondary | ICD-10-CM | POA: Insufficient documentation

## 2022-06-15 DIAGNOSIS — I110 Hypertensive heart disease with heart failure: Secondary | ICD-10-CM | POA: Diagnosis not present

## 2022-06-15 DIAGNOSIS — Z7901 Long term (current) use of anticoagulants: Secondary | ICD-10-CM | POA: Diagnosis not present

## 2022-06-15 DIAGNOSIS — Z6841 Body Mass Index (BMI) 40.0 and over, adult: Secondary | ICD-10-CM | POA: Insufficient documentation

## 2022-06-15 DIAGNOSIS — Z89432 Acquired absence of left foot: Secondary | ICD-10-CM | POA: Insufficient documentation

## 2022-06-15 DIAGNOSIS — L97518 Non-pressure chronic ulcer of other part of right foot with other specified severity: Secondary | ICD-10-CM | POA: Diagnosis not present

## 2022-06-15 DIAGNOSIS — I87331 Chronic venous hypertension (idiopathic) with ulcer and inflammation of right lower extremity: Secondary | ICD-10-CM | POA: Diagnosis not present

## 2022-06-15 DIAGNOSIS — I890 Lymphedema, not elsewhere classified: Secondary | ICD-10-CM | POA: Insufficient documentation

## 2022-06-15 DIAGNOSIS — E1151 Type 2 diabetes mellitus with diabetic peripheral angiopathy without gangrene: Secondary | ICD-10-CM | POA: Diagnosis not present

## 2022-06-15 DIAGNOSIS — I87333 Chronic venous hypertension (idiopathic) with ulcer and inflammation of bilateral lower extremity: Secondary | ICD-10-CM | POA: Diagnosis not present

## 2022-06-15 DIAGNOSIS — L97312 Non-pressure chronic ulcer of right ankle with fat layer exposed: Secondary | ICD-10-CM | POA: Diagnosis not present

## 2022-06-20 ENCOUNTER — Ambulatory Visit (INDEPENDENT_AMBULATORY_CARE_PROVIDER_SITE_OTHER): Payer: Medicare Other

## 2022-06-20 DIAGNOSIS — I482 Chronic atrial fibrillation, unspecified: Secondary | ICD-10-CM

## 2022-06-21 LAB — CUP PACEART REMOTE DEVICE CHECK
Battery Remaining Longevity: 84 mo
Battery Voltage: 3.01 V
Brady Statistic AP VP Percent: 99.08 %
Brady Statistic AP VS Percent: 0 %
Brady Statistic AS VP Percent: 0.14 %
Brady Statistic AS VS Percent: 0.78 %
Brady Statistic RA Percent Paced: 95.96 %
Brady Statistic RV Percent Paced: 96.15 %
Date Time Interrogation Session: 20240610043822
HighPow Impedance: 90 Ohm
Implantable Lead Connection Status: 753985
Implantable Lead Connection Status: 753985
Implantable Lead Implant Date: 20230823
Implantable Lead Implant Date: 20230823
Implantable Lead Location: 753859
Implantable Lead Location: 753860
Implantable Lead Model: 3830
Implantable Lead Model: 6935
Implantable Pulse Generator Implant Date: 20230823
Lead Channel Impedance Value: 304 Ohm
Lead Channel Impedance Value: 437 Ohm
Lead Channel Impedance Value: 437 Ohm
Lead Channel Sensing Intrinsic Amplitude: 14 mV
Lead Channel Sensing Intrinsic Amplitude: 14 mV
Lead Channel Sensing Intrinsic Amplitude: 15.125 mV
Lead Channel Sensing Intrinsic Amplitude: 15.125 mV
Lead Channel Setting Pacing Amplitude: 1.5 V
Lead Channel Setting Pacing Amplitude: 2 V
Lead Channel Setting Pacing Pulse Width: 0.4 ms
Lead Channel Setting Sensing Sensitivity: 0.3 mV
Zone Setting Status: 755011

## 2022-06-22 ENCOUNTER — Encounter (HOSPITAL_BASED_OUTPATIENT_CLINIC_OR_DEPARTMENT_OTHER): Payer: Medicare Other | Admitting: Physician Assistant

## 2022-06-22 DIAGNOSIS — L97312 Non-pressure chronic ulcer of right ankle with fat layer exposed: Secondary | ICD-10-CM | POA: Diagnosis not present

## 2022-06-22 DIAGNOSIS — L97828 Non-pressure chronic ulcer of other part of left lower leg with other specified severity: Secondary | ICD-10-CM | POA: Diagnosis not present

## 2022-06-22 DIAGNOSIS — I87331 Chronic venous hypertension (idiopathic) with ulcer and inflammation of right lower extremity: Secondary | ICD-10-CM | POA: Diagnosis not present

## 2022-06-22 DIAGNOSIS — Z6841 Body Mass Index (BMI) 40.0 and over, adult: Secondary | ICD-10-CM | POA: Diagnosis not present

## 2022-06-22 DIAGNOSIS — I110 Hypertensive heart disease with heart failure: Secondary | ICD-10-CM | POA: Diagnosis not present

## 2022-06-22 DIAGNOSIS — Z89432 Acquired absence of left foot: Secondary | ICD-10-CM | POA: Diagnosis not present

## 2022-06-22 DIAGNOSIS — L97518 Non-pressure chronic ulcer of other part of right foot with other specified severity: Secondary | ICD-10-CM | POA: Diagnosis not present

## 2022-06-22 DIAGNOSIS — I509 Heart failure, unspecified: Secondary | ICD-10-CM | POA: Diagnosis not present

## 2022-06-22 DIAGNOSIS — I2510 Atherosclerotic heart disease of native coronary artery without angina pectoris: Secondary | ICD-10-CM | POA: Diagnosis not present

## 2022-06-22 DIAGNOSIS — Z7901 Long term (current) use of anticoagulants: Secondary | ICD-10-CM | POA: Diagnosis not present

## 2022-06-22 DIAGNOSIS — E1151 Type 2 diabetes mellitus with diabetic peripheral angiopathy without gangrene: Secondary | ICD-10-CM | POA: Diagnosis not present

## 2022-06-22 DIAGNOSIS — E11621 Type 2 diabetes mellitus with foot ulcer: Secondary | ICD-10-CM | POA: Diagnosis not present

## 2022-06-22 DIAGNOSIS — L97818 Non-pressure chronic ulcer of other part of right lower leg with other specified severity: Secondary | ICD-10-CM | POA: Diagnosis not present

## 2022-06-22 DIAGNOSIS — I872 Venous insufficiency (chronic) (peripheral): Secondary | ICD-10-CM | POA: Diagnosis not present

## 2022-06-22 DIAGNOSIS — I890 Lymphedema, not elsewhere classified: Secondary | ICD-10-CM | POA: Diagnosis not present

## 2022-06-22 DIAGNOSIS — I87333 Chronic venous hypertension (idiopathic) with ulcer and inflammation of bilateral lower extremity: Secondary | ICD-10-CM | POA: Diagnosis not present

## 2022-06-23 NOTE — Progress Notes (Addendum)
[name] ([id number]) [id number] Page 1 of 11 Visit Report for 06/22/2022 Chief Complaint Document Details Patient Name: Date of Service: [name], [name] 06/22/2022 3:00 PM Medical Record Number: [id number] Patient Account Number: [id number] Date of Birth/Sex: Treating RN: April 06, 1955 (67 y.o. M) Primary Care Provider: Martha [name] Other Clinician: Referring Provider: Treating Provider/Extender: [name] in Treatment: 78 Information Obtained from: Patient Chief Complaint 08/04/2021; patient returns to clinic with bilateral leg wounds as well as areas on the right foot Electronic Signature(s) Signed: 06/22/2022 3:03:00 PM By: [name] Derry PA-C Entered By: [name] on 06/22/2022 15:03:00 -------------------------------------------------------------------------------- HPI Details Patient Name: Date of Service: [name], [name]. 06/22/2022 3:00 PM Medical Record Number: [id number] Patient Account Number: [id number] Date of Birth/Sex: Treating RN: Sep 30, 1955 (67 y.o. M) Primary Care Provider: Martha [name] Other Clinician: Referring Provider: Treating Provider/Extender: [name] in Treatment: 46 History of Present Illness HPI Description: ADMISSION 03/22/2021 This is a 67 year old man with a past medical history significant for diabetes type 2, congestive heart failure, peripheral arterial disease, morbid obesity, venous insufficiency, and coronary artery disease. He has been followed by [name] in podiatry, who performed a transmetatarsal amputation on the left foot in August 2022. He had issues healing that wound, but based upon [name] notes, ultimately the TMA wound healed. During his recovery from that surgery, however, ulcers opened up over the DIP joint of the right second and third toe. These have apparently closed and reopened multiple times. It sounds like one of the issues has been  moisture accumulation and maceration of the tissues causing them to reopen. At his last visit with [name], on March 01, 2021, there continues to be problems with moisture and he was referred to wound care for further evaluation and management. He had a formal aortogram with runoff performed prior to his TMA. The findings are copied here: Patient has inline flow to both feet with no significant flow-limiting lesion that would be amenable to percutaneous or open revascularization. He does have an element of small vessel disease and has a short segment occlusion of the distal anterior tibial/dorsalis pedis artery on the left foot but does have posterior tibial artery flow. Would recommend management of wounds with amputation of toes 2 and 3 on the right foot if the wounds do not heal and deteriorate. Transmetatarsal amputation on the left side has as good a blood supply as it is going to get and hopefully this will heal in the future. Formal ABIs were done in January 2023. They are normal bilaterally. ABI Findings: +---------+------------------+-----+----------+--------+ Right Rt Pressure (mmHg)IndexWaveform Comment  +---------+------------------+-----+----------+--------+ Brachial 160     +---------+------------------+-----+----------+--------+ PTA 192 1.06 monophasic  +---------+------------------+-----+----------+--------+ DP 159 0.88 monophasic  +---------+------------------+-----+----------+--------+ Great T oe145 0.80    +---------+------------------+-----+----------+--------+ +--------+------------------+-----+---------+-------+ [name] ([id number]) [id number] Page 2 of 11 Left Lt Pressure (mmHg)IndexWaveform Comment +--------+------------------+-----+---------+-------+ [id number]     +--------+------------------+-----+---------+-------+ PTA 204 1.13 triphasic   +--------+------------------+-----+---------+-------+ DP 194 1.07 biphasic   +--------+------------------+-----+---------+-------+ +-------+-----------+-----------+------------+------------+ ABI/TBIT oday's ABIT oday's TBIPrevious ABIPrevious TBI +-------+-----------+-----------+------------+------------+ Right 1.06 0.8 1.26 0.65  +-------+-----------+-----------+------------+------------+ Left 1.13 amputation 1.15 amputation  +-------+-----------+-----------+------------+------------+ Previous ABI on 08/06/20 at [facility] Pedal pressures falsely elevated due to medial calcification. Summary: Right: Resting right ankle-brachial index is within normal range. The right toe-brachial index is normal. Left: Resting left ankle-brachial index is within normal range. READMISSION 08/04/2021 [name] is now a 67 year old man who I remember from this clinic many years ago I  think he had a right lower extremity predominantly venous wound at the time. He was here for 1 visit in March of this year had wounds on his right second and third toes we apparently dressed them many and they healed so he did not come back. He is listed in [place] is being a diabetic although the patient denies this says he is verified it with his primary doctor. In any case over the last several weeks or so according the patient although these wounds look somewhat more chronic than that he has developed predominantly large wounds on the right medial and right lateral ankle smaller areas on the left leg and areas on the dorsal aspect of his right second and third toes. Its not clear how he has been dressing these. 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 The patient has known PAD. He had an angiogram in August 2022 at which time he had nonhealing wounds in both feet. On the left his major vessels in the thigh were all  patent. He had three-vessel patent to the level of the ankle. He had a very short occlusion in the left anterior tibial. On the right lower extremity the major vessels in his thigh were all patent. He had three-vessel runoff to the foot sluggish filling of the anterior tibial artery. He was felt to have some component of small vessel disease but nothing that was amenable or needed revascularization. It was recommended that he have amputation of the second and third toes on the right foot if they did not heal He has been following with [name] of podiatry. [name] got him juxta lite stockings although I do not think he had them on properly he has uncontrolled edema in both legs Past medical history includes type 2 diabetes [although the patient really denies this], left TMA in 2022,lower extremity wounds in fact attendance at this clinic in 2009-2010, A-fib on Eliquis, chronic venous insufficiency with secondary lymphedema history of non-Hodgkin's lymphoma. 08-11-2021 upon evaluation today patient presents for follow-up evaluation he was seen last Wednesday initially for inspection here in our clinic. With that being said he tells me that he unfortunately has been having a lot of drainage and is actually coming through his wrap. Fortunately I do not see any evidence of active infection locally or systemically at this time which is great news. No fevers, chills, nausea, vomiting, or diarrhea. With that being said there does appear to be some evidence of local infection based on what I am seeing today. 08-18-2021 upon evaluation today patient appears to be doing okay currently in regard to his wounds with that being said that he is doing much better but still has a long ways to go to get to where he wanted to be. I think the infection is significantly improved. He has another week of the antibiotic at this point. 08-25-2021 upon evaluation today patient's wounds are actually doing decently well he has  erythema has significantly improved. I think the cellulitis is under controlled I am going to continue him on 2 more weeks of the Levaquin at 500 mg this is a lower dose but I am hoping it will be better for him. 09-08-2021 upon evaluation today patient appears to be doing excellent in regard to his wounds. Since I last saw him he was actually in the hospital where he ended up having a pacemaker put in. Subsequently he tells  me that he is actually doing quite well although they were unsure whether they were going to do it due to the fact that he had the wounds on his legs. And then I am glad they did anything seem to be doing well. 09-15-2021 upon evaluation patient's wounds are actually showing signs of improvement. The right side wounds do appear to have some need for sharp debridement today and I Ernie Hew go ahead and proceed with that. I think that if we get the wounds cleaned up he will actually show signs of continued improvement. I am also leaning towards switching to [facility] which I think will be a much better option for him. 09-22-2021 patient's wounds are showing signs of excellent improvement. I am actually extremely pleased with where we stand and I think that the patient is making great progress. There does not appear to be any signs of active infection. 09-29-2021 upon evaluation today patient appears to be doing excellent in regard to his wounds. He is actually tolerating the dressing changes without complication. Fortunately I see no evidence of active infection locally or systemically at this time which is great news and overall I am extremely pleased with where we stand currently. [id number] upon evaluation today patient actually appears to be doing excellent in regard to his wounds. The left leg is almost completely healed the right ankle is significantly smaller. Overall I am extremely pleased with where we stand at this point. I do not see any evidence of active infection at this  time. 10-13-2021 upon evaluation today patient appears to be doing excellent in regard to his wounds. I really feel like he is making good progress here and I am very pleased in that regard. Fortunately I do not see any signs of active infection at this time. We are using the [facility] topical antibiotic therapy. 10-20-2021 upon evaluation today patient appears to be doing well with regard to his wound on the right medial ankle region the left leg is almost completely healed. I am actually very pleased with where we stand today. 11-03-2021 upon evaluation today patient's wound actually is going require some sharp debridement but appears to be doing much better which is great news. Fortunately I do not see any signs of active infection at this time. 11-10-2021 upon evaluation today patient's wound is actually showing signs of excellent improvement. Fortunately I do not see any evidence of infection locally [name], [name] ([id number]) [id number] Page 3 of 11 or systemically which is great news and overall I am extremely pleased with where we stand today. I do believe he is making good progress he does have his [facility] topical antibiotics with him here today. 11-24-2021 upon evaluation today patient actually showing signs of excellent improvement this appears to be doing much better. Fortunately I do not see any evidence of infection locally or systemically at this time. 12-01-2021 upon evaluation today patient appears to be doing well currently in regard to his wound. He has been tolerating the dressing changes without complication. Fortunately I do not see any evidence of active infection at this time which is great news and overall I am extremely pleased with where we stand today. 12-15-2021 upon evaluation today patient appears to be doing well with regard to his wound. He is showing signs of improvement is slow but nonetheless we are making improvements here. Fortunately I  do not see any evidence of active infection locally nor systemically at this time. We are still using the [facility] topical antibiotic  over the open area only. 12-29-2021 upon evaluation today patient's wound actually is showing signs of excellent improvement. It has been 2 weeks since I perform any debridement and it definitely shows he has some tissue that needs to be cleaned away but I think we can do so quite easily and readily today. The good news is I do not see any signs of infection I think he is doing much better in that regard. Overall I am extremely pleased with where we stand. 01-12-2022 upon evaluation today patient appears to be doing well currently in regard to his wound although it is not getting significantly smaller it is also not getting any larger. Fortunately I do not see any evidence of infection locally nor systemically which is great news and overall I am extremely pleased with where things stand currently. 01-19-2022 upon evaluation today patient appears to be doing well currently in regard to his wound. The PolyMem actually seems to have done extremely well for him. Fortunately I do not see any signs of infection locally nor systemically at this time. 01-26-2022 upon evaluation today patient appears to be making progress. Fortunately there does not appear to be any signs of infection which is great news. No fevers, chills, nausea, vomiting, or diarrhea. With that being said this is very slow to heal and although it is smaller I still feel like we may want to try to do something to speed this up I think that a skin substitute could be beneficial, look into Kerecis. 02-02-2022 upon evaluation today patient's wound is actually showing signs of excellent improvement. I do not see any evidence of infection and overall I think that we are headed in the right direction. Fortunately I think that he is tolerating the dressing changes without complication. 02-09-2022 upon evaluation today  patient appears to be doing well currently in regard to his wound. It does look a little bit macerated but fortunately does not appear to be showing any signs of significant skin breakdown over the macerated area which is good news. Fortunately I do not see any evidence of active infection locally nor systemically at this point which is great news. 02-23-2022 upon evaluation today patient appears to be doing well currently in regard to his wounds. In fact his area on the ankle is actually showing signs of healing quite nicely. We do have the Apligraf ready today I am hopeful this is going to speed things up and get this closed much more effectively and quickly. Fortunately I do not see any signs of active infection locally nor systemically at this time which is great news. 03-02-2022 upon evaluation today patient appears to be doing excellent in regard to his wound. He is actually been tolerating the dressing changes without complication the wound on the left medial lower extremity is doing quite well and Apligraf seems to have been extremely beneficial for him. I am extremely pleased with where we stand today. 03/16/2022: The wound measurements are smaller today. He has accumulation of a yellow crust around the edges and slough on the surface. There is a musty odor coming from the wound. He has been getting Apligraf. 03-30-2022 upon evaluation today patient appears to be making progress. In regard to his leg ulcer. This is slow but nonetheless the Apligraf has fed things up. Fortunately I do not see any evidence of infection locally nor systemically at this time. No fevers, chills, nausea, vomiting, or diarrhea. Patient is here for Apligraf #4 today. 04-13-2022 upon evaluation today patient  appears to be doing a little bit more poorly in regard to his wound. He actually feels like his wrap may have been a little bit tight. Fortunately there does not appear to be any signs of active infection locally nor  systemically which is great news and I am pleased in that regard. 04-20-2022 upon evaluation today patient appears to be doing better in regard to his wound from the standpoint of swelling he is actually lost 33 pounds on torsemide in the past week his leg is significantly smaller compared to what it has been. Fortunately I do not see any signs of active infection locally nor systemically at this time. 04-27-2022 upon evaluation today patient's wound actually is showing signs of erythema and warmth around the edges of the wound I am actually concerned about the possibility of infection. I think we probably need to obtain a wound culture and also can recommend based on what we are seeing that we go ahead and have the patient continue to utilize the compression wrapping which I think has been of benefit. 05-04-2022 upon evaluation today patient appears to be doing well currently in regard to his wound. He has been tolerating the dressing changes without complication. Fortunately there does not appear to be any signs of active infection locally or systemically which is great news. 05-11-2022 upon evaluation today patient actually appears to be doing excellent. He has been tolerating the dressing changes we actually were trialing a new medication on him last week and this was the ergo clean Ag dressing. With that being said this actually seems to have done extremely well at this point. Fortunately I do not see any signs of active infection locally or systemically at this time. 05-18-2022 upon evaluation today patient appears to be doing okay in regard to his wound I do not see any signs of worsening also notes any signs of significant improvement. Fortunately I do believe that removing in the right direction in general. 05-25-22 upon evaluation today patient appears to be doing well currently in regard to his wound. He has been tolerating the dressing changes without complication. Fortunately I do not see any  signs of active infection locally nor systemically which is great news and overall I am extremely pleased with where we stand currently. There does not appear to be any signs of active infection locally nor systemically which is great news. No fevers, chills, nausea, vomiting, or diarrhea. 06-01-2022 upon evaluation today patient actually appears to be making excellent progress in regard to his ankle region. I am very pleased with where we stand I think that he is doing extremely well and very pleased with endoform and drawtex combination. 06-08-2022 upon evaluation today patient appears to be doing well currently in regard to his wound. He has been making good progress and I feel like practicing improvement in the overall size of the wound. I am very pleased in that regard. Fortunately I do not see any signs of active infection locally nor systemically which is great news. No fevers, chills, nausea, vomiting, or diarrhea. 06-15-2022 upon evaluation today patient appears to be doing well currently in regard to his wound. He has been tolerating the dressing changes without complication. I do believe the endoform has been extremely helpful for him up to this point. I do not see any signs of active infection locally nor systemically which is great news and in general I do believe that we are making pretty good progress here which is excellent news. 06-22-2022 upon  evaluation today patient appears to be doing well currently in regard to his wound. He has been tolerating the dressing changes without complication. Fortunately there does not appear to be any signs of active infection at this time which is great news. I think that he is actually taking good progress with the endoform which is excellent as well. CAUY, BUSSELL ([id number]) [id number] Page 4 of 11 Electronic Signature(s) Signed: 06/22/2022 4:00:34 PM By: [name] Derry PA-C Entered By: [name] on 06/22/2022  16:00:33 -------------------------------------------------------------------------------- Physical Exam Details Patient Name: Date of Service: [name], [name]. 06/22/2022 3:00 PM Medical Record Number: [id number] Patient Account Number: [id number] Date of Birth/Sex: Treating RN: February 19, 1955 (67 y.o. M) Primary Care Provider: Martha [name] Other Clinician: Referring Provider: Treating Provider/Extender: [name] in Treatment: 46 Constitutional Chronically ill appearing but in no apparent acute distress. Respiratory normal breathing without difficulty. Psychiatric this patient is able to make decisions and demonstrates good insight into disease process. Alert and Oriented x 3. pleasant and cooperative. Notes Patient's wound bed actually showed signs of good granulation and epithelization there was no need for sharp debridement today in general the wound seems to be really doing quite well and very pleased in this regard. Electronic Signature(s) Signed: 06/22/2022 4:37:26 PM By: [name] Derry PA-C Previous Signature: 06/22/2022 4:37:09 PM Version By: [name] Derry PA-C Entered By: [name] on 06/22/2022 16:37:26 -------------------------------------------------------------------------------- Physician Orders Details Patient Name: Date of Service: [place], [name]. 06/22/2022 3:00 PM Medical Record Number: [id number] Patient Account Number: [id number] Date of Birth/Sex: Treating RN: 05-27-1955 (67 y.o. Harlon Flor, Millard.Loa Primary Care Provider: Martha [name] Other Clinician: Referring Provider: Treating Provider/Extender: [name] in Treatment: [phone] Verbal / Phone Orders: No Diagnosis Coding ICD-10 Coding Code Description I89.0 Lymphedema, not elsewhere classified I87.333 Chronic venous hypertension (idiopathic) with ulcer and inflammation of bilateral lower extremity L97.828 Non-pressure chronic ulcer of other part of left lower leg with  other specified severity L97.818 Non-pressure chronic ulcer of other part of right lower leg with other specified severity E11.621 Type 2 diabetes mellitus with foot ulcer L97.518 Non-pressure chronic ulcer of other part of right foot with other specified severity Follow-up Appointments ppointment in 1 week. Leonard Schwartz Wednesday 06/29/2022 1500 room 8 Return A ppointment in 2 weeks. Leonard Schwartz Wednesday 07/06/2022 1500 room 8 Return A Return appointment in 3 weeks. Leonard Schwartz Wednesday 07/13/2022 1500 room 8 [name], [name] ([id number]) [id number] Page 5 of 11 Anesthetic (In clinic) Topical Lidocaine 5% applied to wound bed - Used in Clinic Prior to debridement Cellular or Tissue Based Products Cellular or Tissue Based Product Type: - Run IVR for Kerrecis- denied #1 Apligraf applied 02/23/2022 #2 Apligraf applied 03/02/2022 #3 Apligraf applied 03/16/2022 #4 Apligraf applied 03/30/2022 HOLD APLIGRAF this week. Cellular or Tissue Based Product applied to wound bed, secured with steri-strips, cover with Adaptic or Mepitel. (DO NOT REMOVE). Bathing/ Shower/ Hygiene May shower with protection but do not get wound dressing(s) wet. Protect dressing(s) with water repellant cover (for example, large plastic bag) or a cast cover and may then take shower. Edema Control - Lymphedema / SCD / Other Elevate legs to the level of the heart or above for 30 minutes daily and/or when sitting for 3-4 times a day throughout the day. Avoid standing for long periods of time. Exercise regularly Moisturize legs daily. - apply every night before bed to left leg. Compression stocking or Garment 30-40 mm/Hg pressure to: - wear your  VIVE compression stocking to left leg. apply in the morning and remove at night. Wound Treatment Wound #7 - Malleolus Wound Laterality: Right, Medial Peri-Wound Care: Zinc Oxide Ointment 30g tube 1 x Per Week/30 Days Discharge Instructions: Apply Zinc Oxide to periwound with  each dressing change Peri-Wound Care: Sween Lotion (Moisturizing lotion) 1 x Per Week/30 Days Discharge Instructions: Apply moisturizing lotion as directed Topical: ketoconazole and TCA cream 1 x Per Week/30 Days Discharge Instructions: ****APPLY TO THE IRRITATED SKIN AT UPPER PORTION OF LOWER LEG.**** Prim Dressing: Endoform 2x2 in 1 x Per Week/30 Days ary Discharge Instructions: Moisten with saline Secondary Dressing: Drawtex 4x4 in 1 x Per Week/30 Days Discharge Instructions: Apply over primary dressing as directed. Secondary Dressing: Optifoam Non-Adhesive Dressing, 4x4 in 1 x Per Week/30 Days Discharge Instructions: Apply medial and anterior lower leg with compression wrap. Secondary Dressing: Woven Gauze Sponge, Non-Sterile 4x4 in 1 x Per Week/30 Days Discharge Instructions: Apply over primary dressing as directed. Secondary Dressing: Zetuvit Plus 4x8 in 1 x Per Week/30 Days Discharge Instructions: Apply over primary dressing as directed. Compression Wrap: Urgo K2, (equivalent to a 4 layer) two layer compression system, regular 1 x Per Week/30 Days Discharge Instructions: Apply Urgo K2 as directed (alternative to 4 layer compression). Electronic Signature(s) Signed: 06/22/2022 4:47:25 PM By: [name] Derry PA-C Signed: 06/23/2022 5:31:32 PM By: Shawn Stall RN, BSN Entered By: Shawn Stall on 06/22/2022 15:40:37 -------------------------------------------------------------------------------- Problem List Details Patient Name: Date of Service: Tudor, [name]. 06/22/2022 3:00 PM Medical Record Number: [id number] Patient Account Number: [id number] Date of Birth/Sex: Treating RN: 28-Sep-1955 (67 y.o. M) Primary Care Provider: Martha [name] Other Clinician: Referring Provider: Treating Provider/Extender: [name] in Treatment: [address] ARCIA, Gweneth Dimitri ([id number]) [id number] Page 6 of 11 ICD-10 Encounter Code Description  Active Date MDM Diagnosis I89.0 Lymphedema, not elsewhere classified 08/04/2021 No Yes I87.333 Chronic venous hypertension (idiopathic) with ulcer and inflammation of 08/04/2021 No Yes bilateral lower extremity L97.828 Non-pressure chronic ulcer of other part of left lower leg with other specified 08/04/2021 No Yes severity L97.818 Non-pressure chronic ulcer of other part of right lower leg with other specified 08/04/2021 No Yes severity E11.621 Type 2 diabetes mellitus with foot ulcer 08/04/2021 No Yes L97.518 Non-pressure chronic ulcer of other part of right foot with other specified 08/04/2021 No Yes severity Inactive Problems Resolved Problems Electronic Signature(s) Signed: 06/22/2022 3:02:44 PM By: [name] Derry PA-C Entered By: [name] on 06/22/2022 15:02:44 -------------------------------------------------------------------------------- Progress Note Details Patient Name: Date of Service: [name], [name]. 06/22/2022 3:00 PM Medical Record Number: [id number] Patient Account Number: [id number] Date of Birth/Sex: Treating RN: 04/08/1955 (67 y.o. M) Primary Care Provider: Martha [name] Other Clinician: Referring Provider: Treating Provider/Extender: [name] in Treatment: 08 Subjective Chief Complaint Information obtained from Patient 08/04/2021; patient returns to clinic with bilateral leg wounds as well as areas on the right foot History of Present Illness (HPI) ADMISSION 03/22/2021 This is a 67 year old man with a past medical history significant for diabetes type 2, congestive heart failure, peripheral arterial disease, morbid obesity, venous insufficiency, and coronary artery disease. He has been followed by [name] in podiatry, who performed a transmetatarsal amputation on the left foot in August 2022. He had issues healing that wound, but based upon [name] notes, ultimately the TMA wound healed. During his recovery from that surgery, however,  ulcers opened up over the DIP joint of the right second and third toe. These have  apparently closed and reopened multiple times. It sounds like one of the issues has been moisture accumulation and maceration of the tissues causing them to reopen. At his last visit with [name], on March 01, 2021, there continues to be problems with moisture and he was referred to wound care for further evaluation and management. He had a formal aortogram with runoff performed prior to his TMA. The findings are copied here: Patient has inline flow to both feet with no significant flow-limiting lesion that would be amenable to percutaneous or open revascularization. He does have an [name], [name] ([id number]) [id number] Page 7 of 11 element of small vessel disease and has a short segment occlusion of the distal anterior tibial/dorsalis pedis artery on the left foot but does have posterior tibial artery flow. Would recommend management of wounds with amputation of toes 2 and 3 on the right foot if the wounds do not heal and deteriorate. Transmetatarsal amputation on the left side has as good a blood supply as it is going to get and hopefully this will heal in the future. Formal ABIs were done in January 2023. They are normal bilaterally. ABI Findings: +---------+------------------+-----+----------+--------+ Right Rt Pressure (mmHg)IndexWaveform Comment  +---------+------------------+-----+----------+--------+ Brachial 160    +---------+------------------+-----+----------+--------+ PTA 192 1.06 monophasic  +---------+------------------+-----+----------+--------+ DP 159 0.88 monophasic  +---------+------------------+-----+----------+--------+ Great T oe145 0.80    +---------+------------------+-----+----------+--------+ +--------+------------------+-----+---------+-------+ Left Lt Pressure (mmHg)IndexWaveform  Comment +--------+------------------+-----+---------+-------+ [id number]    +--------+------------------+-----+---------+-------+ PTA 204 1.13 triphasic  +--------+------------------+-----+---------+-------+ DP 194 1.07 biphasic   +--------+------------------+-----+---------+-------+ +-------+-----------+-----------+------------+------------+ ABI/TBIT oday's ABIT oday's TBIPrevious ABIPrevious TBI +-------+-----------+-----------+------------+------------+ Right 1.06 0.8 1.26 0.65  +-------+-----------+-----------+------------+------------+ Left 1.13 amputation 1.15 amputation  +-------+-----------+-----------+------------+------------+ Previous ABI on 08/06/20 at [facility] Pedal pressures falsely elevated due to medial calcification. Summary: Right: Resting right ankle-brachial index is within normal range. The right toe-brachial index is normal. Left: Resting left ankle-brachial index is within normal range. READMISSION 08/04/2021 Mr. Cornelia is now a 67 year old man who I remember from this clinic many years ago I think he had a right lower extremity predominantly venous wound at the time. He was here for 1 visit in March of this year had wounds on his right second and third toes we apparently dressed them many and they healed so he did not come back. He is listed in [place] is being a diabetic although the patient denies this says he is verified it with his primary doctor. In any case over the last several weeks or so according the patient although these wounds look somewhat more chronic than that he has developed predominantly large wounds on the right medial and right lateral ankle smaller areas on the left leg and areas on the dorsal aspect of his right second and third toes. Its not clear how he has been dressing these. 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 The patient has known PAD. He had an angiogram in August 2022 at which time he had nonhealing wounds in both feet. On the left his major vessels in the thigh were all patent. He had three-vessel patent to the level of the ankle. He had a very short occlusion in the left anterior tibial. On the right lower extremity the major vessels in his thigh were all patent. He had three-vessel runoff to the foot sluggish filling of the anterior tibial artery. He was felt to have some component of small vessel disease but nothing that was amenable or needed revascularization.  It was recommended that he have amputation of the second and third toes on the right foot if they did not heal He has been following with [name] of podiatry. [name] got him juxta lite stockings although I do not think he had them on properly he has uncontrolled edema in both legs Past medical history includes type 2 diabetes [although the patient really denies this], left TMA in 2022,lower extremity wounds in fact attendance at this clinic in 2009-2010, A-fib on Eliquis, chronic venous insufficiency with secondary lymphedema history of non-Hodgkin's lymphoma. 08-11-2021 upon evaluation today patient presents for follow-up evaluation he was seen last Wednesday initially for inspection here in our clinic. With that being said he tells me that he unfortunately has been having a lot of drainage and is actually coming through his wrap. Fortunately I do not see any evidence of active infection locally or systemically at this time which is great news. No fevers, chills, nausea, vomiting, or diarrhea. With that being said there does appear to be some evidence of local infection based on what I am seeing today. 08-18-2021 upon evaluation today patient appears to be doing okay currently in regard to his wounds with that being said that he is doing much better but still has a long ways to go to get to where he wanted to be. I think the  infection is significantly improved. He has another week of the antibiotic at this point. 08-25-2021 upon evaluation today patient's wounds are actually doing decently well he has erythema has significantly improved. I think the cellulitis is under controlled I am going to continue him on 2 more weeks of the Levaquin at 500 mg this is a lower dose but I am hoping it will be better for him. 09-08-2021 upon evaluation today patient appears to be doing excellent in regard to his wounds. Since I last saw him he was actually in the hospital where he ended up having a pacemaker put in. Subsequently he tells me that he is actually doing quite well although they were unsure whether they were going to do it due to the fact that he had the wounds on his legs. And then I am glad they did anything seem to be doing well. 09-15-2021 upon evaluation patient's wounds are actually showing signs of improvement. The right side wounds do appear to have some need for sharp debridement today and I Ernie Hew go ahead and proceed with that. I think that if we get the wounds cleaned up he will actually show signs of continued improvement. I am also leaning towards switching to [facility] which I think will be a much better option for him. 09-22-2021 patient's wounds are showing signs of excellent improvement. I am actually extremely pleased with where we stand and I think that the patient is [name], [name] ([id number]) [id number] Page 8 of 11 making great progress. There does not appear to be any signs of active infection. 09-29-2021 upon evaluation today patient appears to be doing excellent in regard to his wounds. He is actually tolerating the dressing changes without complication. Fortunately I see no evidence of active infection locally or systemically at this time which is great news and overall I am extremely pleased with where we stand currently. [id number] upon evaluation today patient actually  appears to be doing excellent in regard to his wounds. The left leg is almost completely healed the right ankle is significantly smaller. Overall I am extremely pleased with where we stand at this point.  I do not see any evidence of active infection at this time. 10-13-2021 upon evaluation today patient appears to be doing excellent in regard to his wounds. I really feel like he is making good progress here and I am very pleased in that regard. Fortunately I do not see any signs of active infection at this time. We are using the [facility] topical antibiotic therapy. 10-20-2021 upon evaluation today patient appears to be doing well with regard to his wound on the right medial ankle region the left leg is almost completely healed. I am actually very pleased with where we stand today. 11-03-2021 upon evaluation today patient's wound actually is going require some sharp debridement but appears to be doing much better which is great news. Fortunately I do not see any signs of active infection at this time. 11-10-2021 upon evaluation today patient's wound is actually showing signs of excellent improvement. Fortunately I do not see any evidence of infection locally or systemically which is great news and overall I am extremely pleased with where we stand today. I do believe he is making good progress he does have his [facility] topical antibiotics with him here today. 11-24-2021 upon evaluation today patient actually showing signs of excellent improvement this appears to be doing much better. Fortunately I do not see any evidence of infection locally or systemically at this time. 12-01-2021 upon evaluation today patient appears to be doing well currently in regard to his wound. He has been tolerating the dressing changes without complication. Fortunately I do not see any evidence of active infection at this time which is great news and overall I am extremely pleased with where we stand today. 12-15-2021 upon  evaluation today patient appears to be doing well with regard to his wound. He is showing signs of improvement is slow but nonetheless we are making improvements here. Fortunately I do not see any evidence of active infection locally nor systemically at this time. We are still using the [facility] topical antibiotic over the open area only. 12-29-2021 upon evaluation today patient's wound actually is showing signs of excellent improvement. It has been 2 weeks since I perform any debridement and it definitely shows he has some tissue that needs to be cleaned away but I think we can do so quite easily and readily today. The good news is I do not see any signs of infection I think he is doing much better in that regard. Overall I am extremely pleased with where we stand. 01-12-2022 upon evaluation today patient appears to be doing well currently in regard to his wound although it is not getting significantly smaller it is also not getting any larger. Fortunately I do not see any evidence of infection locally nor systemically which is great news and overall I am extremely pleased with where things stand currently. 01-19-2022 upon evaluation today patient appears to be doing well currently in regard to his wound. The PolyMem actually seems to have done extremely well for him. Fortunately I do not see any signs of infection locally nor systemically at this time. 01-26-2022 upon evaluation today patient appears to be making progress. Fortunately there does not appear to be any signs of infection which is great news. No fevers, chills, nausea, vomiting, or diarrhea. With that being said this is very slow to heal and although it is smaller I still feel like we may want to try to do something to speed this up I think that a skin substitute could be beneficial, look into Kerecis.  02-02-2022 upon evaluation today patient's wound is actually showing signs of excellent improvement. I do not see any evidence of infection  and overall I think that we are headed in the right direction. Fortunately I think that he is tolerating the dressing changes without complication. 02-09-2022 upon evaluation today patient appears to be doing well currently in regard to his wound. It does look a little bit macerated but fortunately does not appear to be showing any signs of significant skin breakdown over the macerated area which is good news. Fortunately I do not see any evidence of active infection locally nor systemically at this point which is great news. 02-23-2022 upon evaluation today patient appears to be doing well currently in regard to his wounds. In fact his area on the ankle is actually showing signs of healing quite nicely. We do have the Apligraf ready today I am hopeful this is going to speed things up and get this closed much more effectively and quickly. Fortunately I do not see any signs of active infection locally nor systemically at this time which is great news. 03-02-2022 upon evaluation today patient appears to be doing excellent in regard to his wound. He is actually been tolerating the dressing changes without complication the wound on the left medial lower extremity is doing quite well and Apligraf seems to have been extremely beneficial for him. I am extremely pleased with where we stand today. 03/16/2022: The wound measurements are smaller today. He has accumulation of a yellow crust around the edges and slough on the surface. There is a musty odor coming from the wound. He has been getting Apligraf. 03-30-2022 upon evaluation today patient appears to be making progress. In regard to his leg ulcer. This is slow but nonetheless the Apligraf has fed things up. Fortunately I do not see any evidence of infection locally nor systemically at this time. No fevers, chills, nausea, vomiting, or diarrhea. Patient is here for Apligraf #4 today. 04-13-2022 upon evaluation today patient appears to be doing a little bit more  poorly in regard to his wound. He actually feels like his wrap may have been a little bit tight. Fortunately there does not appear to be any signs of active infection locally nor systemically which is great news and I am pleased in that regard. 04-20-2022 upon evaluation today patient appears to be doing better in regard to his wound from the standpoint of swelling he is actually lost 33 pounds on torsemide in the past week his leg is significantly smaller compared to what it has been. Fortunately I do not see any signs of active infection locally nor systemically at this time. 04-27-2022 upon evaluation today patient's wound actually is showing signs of erythema and warmth around the edges of the wound I am actually concerned about the possibility of infection. I think we probably need to obtain a wound culture and also can recommend based on what we are seeing that we go ahead and have the patient continue to utilize the compression wrapping which I think has been of benefit. 05-04-2022 upon evaluation today patient appears to be doing well currently in regard to his wound. He has been tolerating the dressing changes without complication. Fortunately there does not appear to be any signs of active infection locally or systemically which is great news. 05-11-2022 upon evaluation today patient actually appears to be doing excellent. He has been tolerating the dressing changes we actually were trialing a new medication on him last week and this was  the ergo clean Ag dressing. With that being said this actually seems to have done extremely well at this point. Fortunately I do not see any signs of active infection locally or systemically at this time. 05-18-2022 upon evaluation today patient appears to be doing okay in regard to his wound I do not see any signs of worsening also notes any signs of significant improvement. Fortunately I do believe that removing in the right direction in general. 05-25-22 upon  evaluation today patient appears to be doing well currently in regard to his wound. He has been tolerating the dressing changes without [name], [name] ([id number]) [id number] Page 9 of 11 complication. Fortunately I do not see any signs of active infection locally nor systemically which is great news and overall I am extremely pleased with where we stand currently. There does not appear to be any signs of active infection locally nor systemically which is great news. No fevers, chills, nausea, vomiting, or diarrhea. 06-01-2022 upon evaluation today patient actually appears to be making excellent progress in regard to his ankle region. I am very pleased with where we stand I think that he is doing extremely well and very pleased with endoform and drawtex combination. 06-08-2022 upon evaluation today patient appears to be doing well currently in regard to his wound. He has been making good progress and I feel like practicing improvement in the overall size of the wound. I am very pleased in that regard. Fortunately I do not see any signs of active infection locally nor systemically which is great news. No fevers, chills, nausea, vomiting, or diarrhea. 06-15-2022 upon evaluation today patient appears to be doing well currently in regard to his wound. He has been tolerating the dressing changes without complication. I do believe the endoform has been extremely helpful for him up to this point. I do not see any signs of active infection locally nor systemically which is great news and in general I do believe that we are making pretty good progress here which is excellent news. 06-22-2022 upon evaluation today patient appears to be doing well currently in regard to his wound. He has been tolerating the dressing changes without complication. Fortunately there does not appear to be any signs of active infection at this time which is great news. I think that he is actually taking  good progress with the endoform which is excellent as well. Objective Constitutional Chronically ill appearing but in no apparent acute distress. Vitals Time Taken: 3:13 PM, Height: 71 in, Weight: 350 lbs, BMI: 48.8, Temperature: 98.6 F, Pulse: 90 bpm, Respiratory Rate: 18 breaths/min, Blood Pressure: 138/87 mmHg. Respiratory normal breathing without difficulty. Psychiatric this patient is able to make decisions and demonstrates good insight into disease process. Alert and Oriented x 3. pleasant and cooperative. General Notes: Patient's wound bed actually showed signs of good granulation and epithelization there was no need for sharp debridement today in general the wound seems to be really doing quite well and very pleased in this regard. Integumentary (Hair, Skin) Wound #7 status is Open. Original cause of wound was Blister. The date acquired was: 07/21/2021. The wound has been in treatment 46 weeks. The wound is located on the Right,Medial Malleolus. The wound measures 4.7cm length x 1.8cm width x 0.3cm depth; 6.644cm^2 area and 1.993cm^3 volume. There is Fat Layer (Subcutaneous Tissue) exposed. There is no tunneling or undermining noted. There is a medium amount of serosanguineous drainage noted. The wound margin is distinct with the outline attached to the wound  base. There is large (67-100%) red, pink granulation within the wound bed. There is a small (1-33%) amount of necrotic tissue within the wound bed including Adherent Slough. The periwound skin appearance exhibited: Scarring, Hemosiderin Staining. The periwound skin appearance did not exhibit: Callus, Crepitus, Excoriation, Induration, Rash, Dry/Scaly, Maceration, Atrophie Blanche, Cyanosis, Ecchymosis, Mottled, Pallor, Rubor, Erythema. Assessment Active Problems ICD-10 Lymphedema, not elsewhere classified Chronic venous hypertension (idiopathic) with ulcer and inflammation of bilateral lower extremity Non-pressure chronic ulcer  of other part of left lower leg with other specified severity Non-pressure chronic ulcer of other part of right lower leg with other specified severity Type 2 diabetes mellitus with foot ulcer Non-pressure chronic ulcer of other part of right foot with other specified severity Procedures Wound #7 Pre-procedure diagnosis of Wound #7 is a Venous Leg Ulcer located on the Right,Medial Malleolus . There was a Double Layer Compression Therapy Procedure by Thayer Dallas. Post procedure Diagnosis Wound #7: Same as Pre-Procedure Plan ARCHIBALD, MAGNIFICO ([id number]) [id number] Page 10 of 11 Follow-up Appointments: Return Appointment in 1 week. Leonard Schwartz Wednesday 06/29/2022 1500 room 8 Return Appointment in 2 weeks. Leonard Schwartz Wednesday 07/06/2022 1500 room 8 Return appointment in 3 weeks. Leonard Schwartz Wednesday 07/13/2022 1500 room 8 Anesthetic: (In clinic) Topical Lidocaine 5% applied to wound bed - Used in Clinic Prior to debridement Cellular or Tissue Based Products: Cellular or Tissue Based Product Type: - Run IVR for Kerrecis- denied #1 Apligraf applied 02/23/2022 #2 Apligraf applied 03/02/2022 #3 Apligraf applied 03/16/2022 #4 Apligraf applied 03/30/2022 HOLD APLIGRAF this week. Cellular or Tissue Based Product applied to wound bed, secured with steri-strips, cover with Adaptic or Mepitel. (DO NOT REMOVE). Bathing/ Shower/ Hygiene: May shower with protection but do not get wound dressing(s) wet. Protect dressing(s) with water repellant cover (for example, large plastic bag) or a cast cover and may then take shower. Edema Control - Lymphedema / SCD / Other: Elevate legs to the level of the heart or above for 30 minutes daily and/or when sitting for 3-4 times a day throughout the day. Avoid standing for long periods of time. Exercise regularly Moisturize legs daily. - apply every night before bed to left leg. Compression stocking or Garment 30-40 mm/Hg pressure to: - wear your VIVE  compression stocking to left leg. apply in the morning and remove at night. WOUND #7: - Malleolus Wound Laterality: Right, Medial Peri-Wound Care: Zinc Oxide Ointment 30g tube 1 x Per Week/30 Days Discharge Instructions: Apply Zinc Oxide to periwound with each dressing change Peri-Wound Care: Sween Lotion (Moisturizing lotion) 1 x Per Week/30 Days Discharge Instructions: Apply moisturizing lotion as directed Topical: ketoconazole and TCA cream 1 x Per Week/30 Days Discharge Instructions: ****APPLY TO THE IRRITATED SKIN AT UPPER PORTION OF LOWER LEG.**** Prim Dressing: Endoform 2x2 in 1 x Per Week/30 Days ary Discharge Instructions: Moisten with saline Secondary Dressing: Drawtex 4x4 in 1 x Per Week/30 Days Discharge Instructions: Apply over primary dressing as directed. Secondary Dressing: Optifoam Non-Adhesive Dressing, 4x4 in 1 x Per Week/30 Days Discharge Instructions: Apply medial and anterior lower leg with compression wrap. Secondary Dressing: Woven Gauze Sponge, Non-Sterile 4x4 in 1 x Per Week/30 Days Discharge Instructions: Apply over primary dressing as directed. Secondary Dressing: Zetuvit Plus 4x8 in 1 x Per Week/30 Days Discharge Instructions: Apply over primary dressing as directed. Com pression Wrap: Urgo K2, (equivalent to a 4 layer) two layer compression system, regular 1 x Per Week/30 Days Discharge Instructions: Apply Urgo K2 as directed (alternative to 4  layer compression). 1. I will have the patient continue with the endoform which seems to be doing well he is in agreement with that plan. 2. I am also going to recommend that he continue to monitor for any signs of infection or worsening. Obviously if anything changes he knows contact the office let me know. 3. We will continue with the compression wrap. I did toying with the idea of possibly doing just a weeks of having him change this on Friday and Monday and using Tubigrip instead of the compression wrap to see how this  will do but we discussed this and agreed to get the wrap 1 more week and See where things stand I am okay with that we will see him in 1 week. We will see patient back for reevaluation in 1 week here in the clinic. If anything worsens or changes patient will contact our office for additional recommendations. Electronic Signature(s) Signed: 06/22/2022 4:38:07 PM By: [name] Derry PA-C Entered By: [name] on 06/22/2022 16:38:07 -------------------------------------------------------------------------------- SuperBill Details Patient Name: Date of Service: Melgoza, [name]. 06/22/2022 Medical Record Number: [id number] Patient Account Number: [id number] Date of Birth/Sex: Treating RN: [birth year] (67 y.o. [name] Primary Care Provider: Martha [name] Other Clinician: Referring Provider: Treating Provider/Extender: [name] in Treatment: 46 Diagnosis Coding ICD-10 Codes Code Description I89.0 Lymphedema, not elsewhere classified I87.333 Chronic venous hypertension (idiopathic) with ulcer and inflammation of bilateral lower extremity L97.828 Non-pressure chronic ulcer of other part of left lower leg with other specified severity [name], WITHERELL ([id number]) [id number] Page 11 of 11 L97.818 Non-pressure chronic ulcer of other part of right lower leg with other specified severity E11.621 Type 2 diabetes mellitus with foot ulcer L97.518 Non-pressure chronic ulcer of other part of right foot with other specified severity Facility Procedures : 3 CPT4 Code: 8657846 Description: (Facility Use Only) [phone] - APPLY MULTLAY COMPRS LWR RT LEG Modifier: Quantity: 1 Physician Procedures : CPT4 Code Description Modifier [id number] - WC PHYS LEVEL 3 - EST PT ICD-10 Diagnosis Description I89.0 Lymphedema, not elsewhere classified I87.333 Chronic venous hypertension (idiopathic) with ulcer and inflammation of bilateral lower extremity  L97.828  Non-pressure chronic ulcer of other part of left lower leg with other specified severity L97.818 Non-pressure chronic ulcer of other part of right lower leg with other specified severity Quantity: 1 Electronic Signature(s) Signed: 06/22/2022 4:38:18 PM By: [name] Derry PA-C Entered By: [name] on 06/22/2022 16:38:18

## 2022-06-26 NOTE — Progress Notes (Signed)
TIGE, COSTILOW ([id number]) [id number] Page 1 of 6 Visit Report for 06/22/2022 Arrival Information Details Patient Name: Date of Service: [place], . 06/22/2022 3:00 PM Medical Record Number: [id number] Patient Account Number: [id number] Date of Birth/Sex: Treating RN: 1955-12-10 (67 y.o. M) Primary Care Micaella Gitto: [name] Other Clinician: Referring Ezell Poke: Treating Nathalia Wismer/Extender: Alver Fisher in Treatment: 46 Visit Information History Since Last Visit Added or deleted any medications: No Patient Arrived: [name] Any new allergies or adverse reactions: No Arrival Time: 15:10 Had a fall or experienced change in No Accompanied By: self activities of daily living that may affect Transfer Assistance: None risk of falls: Patient Identification Verified: Yes Signs or symptoms of abuse/neglect since last visito No Secondary Verification Process Completed: Yes Hospitalized since last visit: No Patient Requires Transmission-Based Precautions: No Implantable device outside of the clinic excluding No Patient Has Alerts: Yes cellular tissue based products placed in the center Patient Alerts: Patient on Blood Thinner since last visit: 01/2021 ABI L 1.13 R 1.06 Has Dressing in Place as Prescribed: Yes 01/2021 TBI L amp R 0.8 Pain Present Now: No Electronic Signature(s) Signed: 06/24/2022 2:18:04 PM By: Karl Ito Entered By: Karl Ito on 06/22/2022 15:13:48 -------------------------------------------------------------------------------- Compression Therapy Details Patient Name: Date of Service: Malcolm, [name] 06/22/2022 3:00 PM Medical Record Number: [id number] Patient Account Number: [id number] Date of Birth/Sex: Treating RN: 03/29/1955 (67 y.o. [name] Primary Care Keveon Amsler: [name] Other Clinician: Referring Kitti Mcclish: Treating Leira Regino/Extender: Alver Fisher in Treatment:  64 Compression Therapy Performed for Wound Assessment: Wound #7 Right,Medial Malleolus Performed By: Clinician Thayer Dallas, Compression Type: Double Layer Post Procedure Diagnosis Same as Pre-procedure Electronic Signature(s) Signed: 06/23/2022 5:31:32 PM By: Shawn Stall RN, BSN Entered By: Shawn Stall on 06/22/2022 15:41:05 Weist, Josh D ([id number]) [id number] Page 2 of 6 -------------------------------------------------------------------------------- Encounter Discharge Information Details Patient Name: Date of Service: TYREI, VASILIOU 06/22/2022 3:00 PM Medical Record Number: [id number] Patient Account Number: [id number] Date of Birth/Sex: Treating RN: 1955-04-06 (67 y.o. [name] Primary Care Avyn Coate: [name] Other Clinician: Referring Kerri Asche: Treating Jaci Desanto/Extender: Alver Fisher in Treatment: [phone] Encounter Discharge Information Items Discharge Condition: Stable Ambulatory Status: Walker Discharge Destination: Home Transportation: Private Auto Accompanied By: self Schedule Follow-up Appointment: Yes Clinical Summary of Care: Electronic Signature(s) Signed: 06/23/2022 5:31:32 PM By: Shawn Stall RN, BSN Entered By: Shawn Stall on 06/22/2022 15:41:47 -------------------------------------------------------------------------------- Lower Extremity Assessment Details Patient Name: Date of Service: Rayborn, [name] 06/22/2022 3:00 PM Medical Record Number: [id number] Patient Account Number: [id number] Date of Birth/Sex: Treating RN: 1955/04/29 (67 y.o. M) Primary Care Logyn Kendrick: [name] Other Clinician: Referring Makenzi Bannister: Treating Vale Peraza/Extender: Alver Fisher in Treatment: 46 Edema Assessment Assessed: [Left: No] [Right: No] Edema: [Left: N] [Right: o] Calf Left: Right: Point of Measurement: 31 cm From Medial Instep 39 cm Ankle Left: Right: Point of Measurement: 11  cm From Medial Instep 22.5 cm Electronic Signature(s) Signed: 06/22/2022 4:21:40 PM By: Thayer Dallas Entered By: Thayer Dallas on 06/22/2022 15:22:17 -------------------------------------------------------------------------------- Multi-Disciplinary Care Plan Details Patient Name: Date of Service: Jeanty, [name] 06/22/2022 3:00 PM Medical Record Number: [id number] Patient Account Number: [id number] Date of Birth/Sex: Treating RN: January 19, 1955 (67 y.o. [name] Primary Care Mcadoo Muzquiz: [name] Other Clinician: Referring Kaelyn Nauta: Treating Margalit Leece/Extender: Alver Fisher in Treatment: [address], Jenera D ([id number]) [id number] Page 3 of 6 Multidisciplinary Care Plan reviewed with physician Active  Inactive Pain, Acute or Chronic Nursing Diagnoses: Pain, acute or chronic: actual or potential Potential alteration in comfort, pain Goals: Patient will verbalize adequate pain control and receive pain control interventions during procedures as needed Date Initiated: 08/04/2021 Target Resolution Date: 09/10/2022 Goal Status: Active Patient/caregiver will verbalize comfort level met Date Initiated: 08/04/2021 Target Resolution Date: 09/10/2022 Goal Status: Active Interventions: Encourage patient to take pain medications as prescribed Provide education on pain management Reposition patient for comfort Treatment Activities: Administer pain control measures as ordered : 08/04/2021 Notes: Electronic Signature(s) Signed: 06/23/2022 5:31:32 PM By: Shawn Stall RN, BSN Entered By: Shawn Stall on 06/22/2022 15:35:20 -------------------------------------------------------------------------------- Pain Assessment Details Patient Name: Date of Service: Hugh, [name] 06/22/2022 3:00 PM Medical Record Number: [id number] Patient Account Number: [id number] Date of Birth/Sex: Treating RN: 1955-02-09 (68 y.o. M) Primary Care Jeannine Pennisi: [name] Other Clinician: Referring Shuan Statzer: Treating Malaiya Paczkowski/Extender: Alver Fisher in Treatment: 46 Active Problems Location of Pain Severity and Description of Pain Patient Has Paino No Site Locations Pain Management and Medication HILDRED, WICKE ([id number]) [id number] Page 4 of 6 Current Pain Management: Electronic Signature(s) Signed: 06/24/2022 2:18:04 PM By: Karl Ito Entered By: Karl Ito on 06/22/2022 15:14:14 -------------------------------------------------------------------------------- Patient/Caregiver Education Details Patient Name: Date of Service: Debruhl, [name] 6/12/2024andnbsp3:00 PM Medical Record Number: [id number] Patient Account Number: [id number] Date of Birth/Gender: Treating RN: [birth year] (67 y.o. [name] Primary Care Physician: [name] Other Clinician: Referring Physician: Treating Physician/Extender: Alver Fisher in Treatment: 32 Education Assessment Education Provided To: Patient Education Topics Provided Wound/Skin Impairment: Handouts: Caring for Your Ulcer Methods: Explain/Verbal Responses: Reinforcements needed Electronic Signature(s) Signed: 06/23/2022 5:31:32 PM By: Shawn Stall RN, BSN Entered By: Shawn Stall on 06/22/2022 15:35:32 -------------------------------------------------------------------------------- Wound Assessment Details Patient Name: Date of Service: Tapp, [name] 06/22/2022 3:00 PM Medical Record Number: [id number] Patient Account Number: [id number] Date of Birth/Sex: Treating RN: [birth year] (67 y.o. M) Primary Care Soni Kegel: [name] Other Clinician: Referring Averil Digman: Treating Ryot Burrous/Extender: Alver Fisher in Treatment: 46 Wound Status Wound Number: 7 Primary Venous Leg Ulcer Etiology: Wound Location: Right, Medial Malleolus Secondary Lymphedema Wounding Event:  Blister Etiology: Date Acquired: 07/21/2021 Wound Open Weeks Of Treatment: 46 Status: Clustered Wound: No Comorbid Sleep Apnea, Arrhythmia, Congestive Heart Failure, History: Hypertension, Hypotension, Peripheral Arterial Disease, Peripheral Venous Disease, Type II Diabetes, Gout, Osteoarthritis Photos KAELIB, REIERSON D ([id number]) [id number] Page 5 of 6 Wound Measurements Length: (cm) 4.7 Width: (cm) 1.8 Depth: (cm) 0.3 Area: (cm) 6.644 Volume: (cm) 1.993 % Reduction in Area: 82.6% % Reduction in Volume: 92.6% Epithelialization: Small (1-33%) Tunneling: No Undermining: No Wound Description Classification: Full Thickness With Exposed Suppo Wound Margin: Distinct, outline attached Exudate Amount: Medium Exudate Type: Serosanguineous Exudate Color: red, brown rt Structures Foul Odor After Cleansing: No Slough/Fibrino Yes Wound Bed Granulation Amount: Large (67-100%) Exposed Structure Granulation Quality: Red, Pink Fascia Exposed: No Necrotic Amount: Small (1-33%) Fat Layer (Subcutaneous Tissue) Exposed: Yes Necrotic Quality: Adherent Slough Tendon Exposed: No Muscle Exposed: No Joint Exposed: No Bone Exposed: No Periwound Skin Texture Texture Color No Abnormalities Noted: No No Abnormalities Noted: No Callus: No Atrophie Blanche: No Crepitus: No Cyanosis: No Excoriation: No Ecchymosis: No Induration: No Erythema: No Rash: No Hemosiderin Staining: Yes Scarring: Yes Mottled: No Pallor: No Moisture Rubor: No No Abnormalities Noted: No Dry / Scaly: No Maceration: No Treatment Notes Wound #7 (Malleolus) Wound Laterality: Right, Medial Cleanser Peri-Wound Care Zinc Oxide Ointment 30g tube  Discharge Instruction: Apply Zinc Oxide to periwound with each dressing change Sween Lotion (Moisturizing lotion) Discharge Instruction: Apply moisturizing lotion as directed Topical ketoconazole and TCA cream Discharge Instruction: ****APPLY TO  THE IRRITATED SKIN AT UPPER PORTION OF LOWER LEG.**** Primary Dressing Endoform 2x2 in Discharge Instruction: Moisten with saline Secondary Dressing Drawtex 4x4 in Discharge Instruction: Apply over primary dressing as directed. HERMON, FIERSTEIN ([id number]) [id number] Page 6 of 6 Optifoam Non-Adhesive Dressing, 4x4 in Discharge Instruction: Apply medial and anterior lower leg with compression wrap. Woven Gauze Sponge, Non-Sterile 4x4 in Discharge Instruction: Apply over primary dressing as directed. Zetuvit Plus 4x8 in Discharge Instruction: Apply over primary dressing as directed. Secured With Compression Wrap Urgo K2, (equivalent to a 4 layer) two layer compression system, regular Discharge Instruction: Apply Urgo K2 as directed (alternative to 4 layer compression). Compression Stockings Add-Ons Electronic Signature(s) Signed: 06/22/2022 4:21:40 PM By: Thayer Dallas Entered By: Thayer Dallas on 06/22/2022 15:22:03 -------------------------------------------------------------------------------- Vitals Details Patient Name: Date of Service: Roen, [name] 06/22/2022 3:00 PM Medical Record Number: [id number] Patient Account Number: [id number] Date of Birth/Sex: Treating RN: 1955-07-20 (67 y.o. M) Primary Care Bronco Mcgrory: [name] Other Clinician: Referring Vuk Skillern: Treating Shlok Raz/Extender: Alver Fisher in Treatment: 46 Vital Signs Time Taken: 15:13 Temperature (F): 98.6 Height (in): 71 Pulse (bpm): 90 Weight (lbs): 350 Respiratory Rate (breaths/min): 18 Body Mass Index (BMI): 48.8 Blood Pressure (mmHg): 138/87 Reference Range: 80 - 120 mg / dl Electronic Signature(s) Signed: 06/24/2022 2:18:04 PM By: Karl Ito Entered By: Karl Ito on 06/22/2022 15:14:06

## 2022-06-27 DIAGNOSIS — I110 Hypertensive heart disease with heart failure: Secondary | ICD-10-CM | POA: Diagnosis not present

## 2022-06-27 DIAGNOSIS — G4733 Obstructive sleep apnea (adult) (pediatric): Secondary | ICD-10-CM | POA: Diagnosis not present

## 2022-06-27 DIAGNOSIS — E1151 Type 2 diabetes mellitus with diabetic peripheral angiopathy without gangrene: Secondary | ICD-10-CM | POA: Diagnosis not present

## 2022-06-29 ENCOUNTER — Encounter (HOSPITAL_BASED_OUTPATIENT_CLINIC_OR_DEPARTMENT_OTHER): Payer: Medicare Other | Admitting: Physician Assistant

## 2022-06-29 DIAGNOSIS — I110 Hypertensive heart disease with heart failure: Secondary | ICD-10-CM | POA: Diagnosis not present

## 2022-06-29 DIAGNOSIS — I2510 Atherosclerotic heart disease of native coronary artery without angina pectoris: Secondary | ICD-10-CM | POA: Diagnosis not present

## 2022-06-29 DIAGNOSIS — I872 Venous insufficiency (chronic) (peripheral): Secondary | ICD-10-CM | POA: Diagnosis not present

## 2022-06-29 DIAGNOSIS — I87333 Chronic venous hypertension (idiopathic) with ulcer and inflammation of bilateral lower extremity: Secondary | ICD-10-CM | POA: Diagnosis not present

## 2022-06-29 DIAGNOSIS — I509 Heart failure, unspecified: Secondary | ICD-10-CM | POA: Diagnosis not present

## 2022-06-29 DIAGNOSIS — L97818 Non-pressure chronic ulcer of other part of right lower leg with other specified severity: Secondary | ICD-10-CM | POA: Diagnosis not present

## 2022-06-29 DIAGNOSIS — Z7901 Long term (current) use of anticoagulants: Secondary | ICD-10-CM | POA: Diagnosis not present

## 2022-06-29 DIAGNOSIS — L97312 Non-pressure chronic ulcer of right ankle with fat layer exposed: Secondary | ICD-10-CM | POA: Diagnosis not present

## 2022-06-29 DIAGNOSIS — Z89432 Acquired absence of left foot: Secondary | ICD-10-CM | POA: Diagnosis not present

## 2022-06-29 DIAGNOSIS — E1151 Type 2 diabetes mellitus with diabetic peripheral angiopathy without gangrene: Secondary | ICD-10-CM | POA: Diagnosis not present

## 2022-06-29 DIAGNOSIS — L97518 Non-pressure chronic ulcer of other part of right foot with other specified severity: Secondary | ICD-10-CM | POA: Diagnosis not present

## 2022-06-29 DIAGNOSIS — I890 Lymphedema, not elsewhere classified: Secondary | ICD-10-CM | POA: Diagnosis not present

## 2022-06-29 DIAGNOSIS — Z6841 Body Mass Index (BMI) 40.0 and over, adult: Secondary | ICD-10-CM | POA: Diagnosis not present

## 2022-06-29 DIAGNOSIS — L97828 Non-pressure chronic ulcer of other part of left lower leg with other specified severity: Secondary | ICD-10-CM | POA: Diagnosis not present

## 2022-06-29 DIAGNOSIS — I87331 Chronic venous hypertension (idiopathic) with ulcer and inflammation of right lower extremity: Secondary | ICD-10-CM | POA: Diagnosis not present

## 2022-06-29 DIAGNOSIS — E11621 Type 2 diabetes mellitus with foot ulcer: Secondary | ICD-10-CM | POA: Diagnosis not present

## 2022-06-29 NOTE — Progress Notes (Addendum)
AUGUSTA, [name] ([id number]) [id number] Page 1 of 3 Visit Report for 06/29/2022 Chief Complaint Document Details Patient Name: Date of Service: [name], [name]. 06/29/2022 3:00 PM Medical Record Number: [id number] Patient Account Number: [id number] Date of Birth/Sex: Treating RN: 09-07-1955 (67 y.o. M) Primary Care Provider: [name] Other Clinician: Referring Provider: Treating Provider/Extender: Alver Fisher in Treatment: 54 Information Obtained from: Patient Chief Complaint 08/04/2021; patient returns to clinic with bilateral leg wounds as well as areas on the right foot Electronic Signature(s) Signed: 06/29/2022 3:08:44 PM By: Allen Derry PA-C Entered By: Allen Derry on 06/29/2022 15:08:44 -------------------------------------------------------------------------------- Physician Orders Details Patient Name: Date of Service: [name], [name] 06/29/2022 3:00 PM Medical Record Number: [id number] Patient Account Number: [id number] Date of Birth/Sex: Treating RN: June 11, 1955 (67 y.o. Harlon Flor, Millard.Loa Primary Care Provider: [name] Other Clinician: Referring Provider: Treating Provider/Extender: Alver Fisher in Treatment: 3 Verbal / Phone Orders: No Diagnosis Coding ICD-10 Coding Code Description I89.0 Lymphedema, not elsewhere classified I87.333 Chronic venous hypertension (idiopathic) with ulcer and inflammation of bilateral lower extremity L97.828 Non-pressure chronic ulcer of other part of left lower leg with other specified severity L97.818 Non-pressure chronic ulcer of other part of right lower leg with other specified severity E11.621 Type 2 diabetes mellitus with foot ulcer L97.518 Non-pressure chronic ulcer of other part of right foot with other specified severity Follow-up Appointments ppointment in 1 week. Leonard Schwartz Wednesday 07/06/2022 1500 room 8 Return A ppointment in 2 weeks. Leonard Schwartz Wednesday  07/13/2022 1500 room 8 Return A Anesthetic (In clinic) Topical Lidocaine 5% applied to wound bed - Used in Clinic Prior to debridement Cellular or Tissue Based Products Cellular or Tissue Based Product Type: - Run IVR for Kerrecis- denied #1 Apligraf applied 02/23/2022 #2 Apligraf applied 03/02/2022 #3 Apligraf applied 03/16/2022 #4 Apligraf applied 03/30/2022 HOLD APLIGRAF this week. Cellular or Tissue Based Product applied to wound bed, secured with steri-strips, cover with Adaptic or Mepitel. (DO NOT REMOVE). MARQUAVION, ZENK ([id number]) [id number] Page 2 of 3 Bathing/ Shower/ Hygiene May shower with protection but do not get wound dressing(s) wet. Protect dressing(s) with water repellant cover (for example, large plastic bag) or a cast cover and may then take shower. Edema Control - Lymphedema / SCD / Other Elevate legs to the level of the heart or above for 30 minutes daily and/or when sitting for 3-4 times a day throughout the day. Avoid standing for long periods of time. Exercise regularly Moisturize legs daily. - apply every night before bed to left leg. Compression stocking or Garment 30-40 mm/Hg pressure to: - wear your VIVE compression stocking to left leg. apply in the morning and remove at night. Wound Treatment Wound #7 - Malleolus Wound Laterality: Right, Medial Peri-Wound Care: Zinc Oxide Ointment 30g tube 1 x Per Week/30 Days Discharge Instructions: Apply Zinc Oxide to periwound with each dressing change Peri-Wound Care: Sween Lotion (Moisturizing lotion) 1 x Per Week/30 Days Discharge Instructions: Apply moisturizing lotion as directed Topical: ketoconazole and TCA cream 1 x Per Week/30 Days Discharge Instructions: ****APPLY TO THE IRRITATED SKIN AT UPPER PORTION OF LOWER LEG.**** Prim Dressing: Endoform 2x2 in 1 x Per Week/30 Days ary Discharge Instructions: Moisten with saline Secondary Dressing: Drawtex 4x4 in 1 x Per Week/30 Days Discharge  Instructions: Apply over primary dressing as directed. Secondary Dressing: Optifoam Non-Adhesive Dressing, 4x4 in 1 x Per Week/30 Days Discharge Instructions: Apply medial and anterior lower leg with compression wrap. Secondary  Dressing: Woven Gauze Sponge, Non-Sterile 4x4 in 1 x Per Week/30 Days Discharge Instructions: Apply over primary dressing as directed. Secondary Dressing: Zetuvit Plus 4x8 in 1 x Per Week/30 Days Discharge Instructions: Apply over primary dressing as directed. Compression Wrap: Urgo K2, (equivalent to a 4 layer) two layer compression system, regular 1 x Per Week/30 Days Discharge Instructions: Apply Urgo K2 as directed (alternative to 4 layer compression). Electronic Signature(s) Signed: 06/29/2022 5:08:20 PM By: Shawn Stall RN, BSN Signed: 06/30/2022 5:52:49 PM By: Allen Derry PA-C Entered By: Shawn Stall on 06/29/2022 15:52:13 -------------------------------------------------------------------------------- Problem List Details Patient Name: Date of Service: [name], [name] 06/29/2022 3:00 PM Medical Record Number: [id number] Patient Account Number: [id number] Date of Birth/Sex: Treating RN: 08/14/1955 (67 y.o. M) Primary Care Provider: [name] Other Clinician: Referring Provider: Treating Provider/Extender: Alver Fisher in Treatment: 78 Active Problems ICD-10 Encounter Code Description Active Date MDM Diagnosis I89.0 Lymphedema, not elsewhere classified 08/04/2021 No Yes I87.333 Chronic venous hypertension (idiopathic) with ulcer and inflammation of 08/04/2021 No Yes bilateral lower extremity [name], [name] ([id number]) [id number] Page 3 of 3 [id number] Non-pressure chronic ulcer of other part of left lower leg with other specified 08/04/2021 No Yes severity L97.818 Non-pressure chronic ulcer of other part of right lower leg with other specified 08/04/2021 No Yes severity E11.621 Type 2 diabetes mellitus  with foot ulcer 08/04/2021 No Yes L97.518 Non-pressure chronic ulcer of other part of right foot with other specified 08/04/2021 No Yes severity Inactive Problems Resolved Problems Electronic Signature(s) Signed: 06/29/2022 3:08:35 PM By: Allen Derry PA-C Entered By: Allen Derry on 06/29/2022 15:08:35 -------------------------------------------------------------------------------- SuperBill Details Patient Name: Date of Service: Koone, [name] 06/29/2022 Medical Record Number: [id number] Patient Account Number: [id number] Date of Birth/Sex: Treating RN: 12-01-1955 (67 y.o. Harlon Flor, Millard.Loa Primary Care Provider: [name] Other Clinician: Referring Provider: Treating Provider/Extender: Alver Fisher in Treatment: 47 Diagnosis Coding ICD-10 Codes Code Description I89.0 Lymphedema, not elsewhere classified I87.333 Chronic venous hypertension (idiopathic) with ulcer and inflammation of bilateral lower extremity L97.828 Non-pressure chronic ulcer of other part of left lower leg with other specified severity L97.818 Non-pressure chronic ulcer of other part of right lower leg with other specified severity E11.621 Type 2 diabetes mellitus with foot ulcer L97.518 Non-pressure chronic ulcer of other part of right foot with other specified severity Facility Procedures : 3 CPT4 Code: 3244010 Description: (Facility Use Only) [phone] - APPLY MULTLAY COMPRS LWR RT LEG Modifier: Quantity: 1 Electronic Signature(s) Signed: 06/29/2022 5:08:20 PM By: Shawn Stall RN, BSN Signed: 06/30/2022 5:52:49 PM By: Allen Derry PA-C Entered By: Shawn Stall on 06/29/2022 15:52:45

## 2022-07-01 NOTE — Progress Notes (Signed)
COLLIN, RENGEL ([id number]) [id number] Page 1 of 6 Visit Report for 06/29/2022 Arrival Information Details Patient Name: Date of Service: [name], [place]. 06/29/2022 3:00 PM Medical Record Number: [id number] Patient Account Number: [id number] Date of Birth/Sex: Treating RN: Feb 05, 1955 (67 y.o. M) Primary Care Daxten Kovalenko: [name] Other Clinician: Referring Maddalynn Barnard: Treating Pinchos Topel/Extender: Alver Fisher in Treatment: 1 Visit Information History Since Last Visit Added or deleted any medications: No Patient Arrived: [name] Any new allergies or adverse reactions: No Arrival Time: 15:17 Had a fall or experienced change in No Accompanied By: self activities of daily living that may affect Transfer Assistance: None risk of falls: Patient Identification Verified: Yes Signs or symptoms of abuse/neglect since last visito No Secondary Verification Process Completed: Yes Hospitalized since last visit: No Patient Requires Transmission-Based Precautions: No Implantable device outside of the clinic excluding No Patient Has Alerts: Yes cellular tissue based products placed in the center Patient Alerts: Patient on Blood Thinner since last visit: 01/2021 ABI L 1.13 R 1.06 Has Dressing in Place as Prescribed: Yes 01/2021 TBI L amp R 0.8 Has Compression in Place as Prescribed: Yes Pain Present Now: No Electronic Signature(s) Signed: 07/01/2022 2:31:37 PM By: Thayer Dallas Entered By: Thayer Dallas on 06/29/2022 15:19:33 -------------------------------------------------------------------------------- Compression Therapy Details Patient Name: Date of Service: Friske, DA [name] D. 06/29/2022 3:00 PM Medical Record Number: [id number] Patient Account Number: [id number] Date of Birth/Sex: Treating RN: [birth year] (67 y.o. [name] Primary Care Wilmar Prabhakar: [name] Other Clinician: Referring Suzzanne Brunkhorst: Treating Taleah Bellantoni/Extender: Alver Fisher in Treatment: 47 Compression Therapy Performed for Wound Assessment: Wound #7 Right,Medial Malleolus Performed By: Clinician Shawn Stall, RN Compression Type: Double Layer Post Procedure Diagnosis Same as Pre-procedure Electronic Signature(s) Signed: 06/29/2022 5:08:20 PM By: Shawn Stall RN, BSN Entered By: Shawn Stall on 06/29/2022 15:48:04 Rommel, [name] ([id number]) [id number] Page 2 of 6 -------------------------------------------------------------------------------- Encounter Discharge Information Details Patient Name: Date of Service: [name], [name] 06/29/2022 3:00 PM Medical Record Number: [id number] Patient Account Number: [id number] Date of Birth/Sex: Treating RN: [birth year] (67 y.o. [name] Primary Care Amisha Pospisil: [name] Other Clinician: Referring Amaru Burroughs: Treating Vara Mairena/Extender: Alver Fisher in Treatment: 3 Encounter Discharge Information Items Discharge Condition: Stable Ambulatory Status: Walker Discharge Destination: Home Transportation: Private Auto Accompanied By: self Schedule Follow-up Appointment: Yes Clinical Summary of Care: Electronic Signature(s) Signed: 06/29/2022 5:08:20 PM By: Shawn Stall RN, BSN Entered By: Shawn Stall on 06/29/2022 15:53:21 -------------------------------------------------------------------------------- Lower Extremity Assessment Details Patient Name: Date of Service: Glaze, [name] 06/29/2022 3:00 PM Medical Record Number: [id number] Patient Account Number: [id number] Date of Birth/Sex: Treating RN: August 02, 1955 (67 y.o. M) Primary Care Juwana Thoreson: [name] Other Clinician: Referring Zahniya Zellars: Treating Khaleb Broz/Extender: Alver Fisher in Treatment: 47 Edema Assessment Assessed: [Left: No] [Right: No] Edema: [Left: N] [Right: o] Calf Left: Right: Point of Measurement: 31 cm From Medial Instep 38  cm Ankle Left: Right: Point of Measurement: 11 cm From Medial Instep 25 cm Electronic Signature(s) Signed: 07/01/2022 2:31:37 PM By: Thayer Dallas Entered By: Thayer Dallas on 06/29/2022 15:27:47 -------------------------------------------------------------------------------- Multi-Disciplinary Care Plan Details Patient Name: Date of Service: Gann, [name] 06/29/2022 3:00 PM Medical Record Number: [id number] Patient Account Number: [id number] Date of Birth/Sex: Treating RN: [birth year] (67 y.o. [name] Primary Care Glendell Schlottman: [name] Other Clinician: Referring Artesia Berkey: Treating Shirlena Brinegar/Extender: Alver Fisher in Treatment: [address] [place] D ([id number]) [id number] Page 3 of  6 Multidisciplinary Care Plan reviewed with physician Active Inactive Pain, Acute or Chronic Nursing Diagnoses: Pain, acute or chronic: actual or potential Potential alteration in comfort, pain Goals: Patient will verbalize adequate pain control and receive pain control interventions during procedures as needed Date Initiated: 08/04/2021 Target Resolution Date: 09/10/2022 Goal Status: Active Patient/caregiver will verbalize comfort level met Date Initiated: 08/04/2021 Target Resolution Date: 09/10/2022 Goal Status: Active Interventions: Encourage patient to take pain medications as prescribed Provide education on pain management Reposition patient for comfort Treatment Activities: Administer pain control measures as ordered : 08/04/2021 Notes: Electronic Signature(s) Signed: 06/29/2022 5:08:20 PM By: Shawn Stall RN, BSN Entered By: Shawn Stall on 06/29/2022 15:50:27 -------------------------------------------------------------------------------- Pain Assessment Details Patient Name: Date of Service: [place], [name] 06/29/2022 3:00 PM Medical Record Number: [id number] Patient Account Number: [id number] Date of Birth/Sex: Treating  RN: [birth year] (67 y.o. M) Primary Care Maydelin Deming: [name] Other Clinician: Referring Jaystin Mcgarvey: Treating Yassmin Binegar/Extender: Alver Fisher in Treatment: 47 Active Problems Location of Pain Severity and Description of Pain Patient Has Paino No Site Locations Pain Management and Medication MAXEMILIANO, RIEL ([id number]) [id number] Page 4 of 6 Current Pain Management: Electronic Signature(s) Signed: 07/01/2022 2:31:37 PM By: Thayer Dallas Entered By: Thayer Dallas on 06/29/2022 15:22:32 -------------------------------------------------------------------------------- Patient/Caregiver Education Details Patient Name: Date of Service: Zech, [name] 6/19/2024andnbsp3:00 PM Medical Record Number: [id number] Patient Account Number: [id number] Date of Birth/Gender: Treating RN: Jul 14, 1955 (67 y.o. [name] Primary Care Physician: [name] Other Clinician: Referring Physician: Treating Physician/Extender: Alver Fisher in Treatment: 69 Education Assessment Education Provided To: Patient Education Topics Provided Wound/Skin Impairment: Handouts: Caring for Your Ulcer Methods: Explain/Verbal Responses: Reinforcements needed Electronic Signature(s) Signed: 06/29/2022 5:08:20 PM By: Shawn Stall RN, BSN Entered By: Shawn Stall on 06/29/2022 15:50:40 -------------------------------------------------------------------------------- Wound Assessment Details Patient Name: Date of Service: [name], [name] 06/29/2022 3:00 PM Medical Record Number: [id number] Patient Account Number: [id number] Date of Birth/Sex: Treating RN: [birth year] (67 y.o. M) Primary Care Sopheap Basic: [name] Other Clinician: Referring Charlton Boule: Treating Kori Goins/Extender: Alver Fisher in Treatment: 47 Wound Status Wound Number: 7 Primary Venous Leg Ulcer Etiology: Wound Location: Right, Medial  Malleolus Secondary Lymphedema Wounding Event: Blister Etiology: Date Acquired: 07/21/2021 Wound Open Weeks Of Treatment: 47 Status: Clustered Wound: No Comorbid Sleep Apnea, Arrhythmia, Congestive Heart Failure, History: Hypertension, Hypotension, Peripheral Arterial Disease, Peripheral Venous Disease, Type II Diabetes, Gout, Osteoarthritis Photos [name], [name] D ([id number]) [id number] Page 5 of 6 Wound Measurements Length: (cm) 4.5 Width: (cm) 1.6 Depth: (cm) 0.2 Area: (cm) 5.655 Volume: (cm) 1.131 % Reduction in Area: 85.2% % Reduction in Volume: 95.8% Epithelialization: Small (1-33%) Tunneling: No Undermining: No Wound Description Classification: Full Thickness With Exposed Suppo Wound Margin: Distinct, outline attached Exudate Amount: Medium Exudate Type: Serosanguineous Exudate Color: red, brown rt Structures Foul Odor After Cleansing: No Slough/Fibrino Yes Wound Bed Granulation Amount: Large (67-100%) Exposed Structure Granulation Quality: Red, Pink Fascia Exposed: No Necrotic Amount: Small (1-33%) Fat Layer (Subcutaneous Tissue) Exposed: Yes Necrotic Quality: Adherent Slough Tendon Exposed: No Muscle Exposed: No Joint Exposed: No Bone Exposed: No Periwound Skin Texture Texture Color No Abnormalities Noted: No No Abnormalities Noted: No Callus: No Atrophie Blanche: No Crepitus: No Cyanosis: No Excoriation: No Ecchymosis: No Induration: No Erythema: No Rash: No Hemosiderin Staining: Yes Scarring: Yes Mottled: No Pallor: No Moisture Rubor: No No Abnormalities Noted: No Dry / Scaly: No Maceration: No Treatment Notes Wound #7 (Malleolus) Wound Laterality: Right, Medial  Cleanser Peri-Wound Care Zinc Oxide Ointment 30g tube Discharge Instruction: Apply Zinc Oxide to periwound with each dressing change Sween Lotion (Moisturizing lotion) Discharge Instruction: Apply moisturizing lotion as directed Topical ketoconazole  and TCA cream Discharge Instruction: ****APPLY TO THE IRRITATED SKIN AT UPPER PORTION OF LOWER LEG.**** Primary Dressing Endoform 2x2 in Discharge Instruction: Moisten with saline Secondary Dressing Drawtex 4x4 in Discharge Instruction: Apply over primary dressing as directed. JAHEL, [name] ([id number]) [id number] Page 6 of 6 Optifoam Non-Adhesive Dressing, 4x4 in Discharge Instruction: Apply medial and anterior lower leg with compression wrap. Woven Gauze Sponge, Non-Sterile 4x4 in Discharge Instruction: Apply over primary dressing as directed. Zetuvit Plus 4x8 in Discharge Instruction: Apply over primary dressing as directed. Secured With Compression Wrap Urgo K2, (equivalent to a 4 layer) two layer compression system, regular Discharge Instruction: Apply Urgo K2 as directed (alternative to 4 layer compression). Compression Stockings Add-Ons Electronic Signature(s) Signed: 07/01/2022 2:31:37 PM By: Thayer Dallas Entered By: Thayer Dallas on 06/29/2022 15:38:12 -------------------------------------------------------------------------------- Vitals Details Patient Name: Date of Service: [name], [name] 06/29/2022 3:00 PM Medical Record Number: [id number] Patient Account Number: [id number] Date of Birth/Sex: Treating RN: 08-Feb-1955 (67 y.o. M) Primary Care Nathifa Ritthaler: [name] Other Clinician: Referring Royann Wildasin: Treating Mosiah Bastin/Extender: Alver Fisher in Treatment: 47 Vital Signs Time Taken: 15:22 Temperature (F): 98.6 Height (in): 71 Pulse (bpm): 87 Weight (lbs): 350 Respiratory Rate (breaths/min): 18 Body Mass Index (BMI): 48.8 Blood Pressure (mmHg): 127/83 Reference Range: 80 - 120 mg / dl Electronic Signature(s) Signed: 07/01/2022 2:31:37 PM By: Thayer Dallas Entered By: Thayer Dallas on 06/29/2022 15:22:24

## 2022-07-06 ENCOUNTER — Encounter (HOSPITAL_BASED_OUTPATIENT_CLINIC_OR_DEPARTMENT_OTHER): Payer: Medicare Other | Admitting: Physician Assistant

## 2022-07-06 DIAGNOSIS — I509 Heart failure, unspecified: Secondary | ICD-10-CM | POA: Diagnosis not present

## 2022-07-06 DIAGNOSIS — I890 Lymphedema, not elsewhere classified: Secondary | ICD-10-CM | POA: Diagnosis not present

## 2022-07-06 DIAGNOSIS — I872 Venous insufficiency (chronic) (peripheral): Secondary | ICD-10-CM | POA: Diagnosis not present

## 2022-07-06 DIAGNOSIS — I87333 Chronic venous hypertension (idiopathic) with ulcer and inflammation of bilateral lower extremity: Secondary | ICD-10-CM | POA: Diagnosis not present

## 2022-07-06 DIAGNOSIS — I110 Hypertensive heart disease with heart failure: Secondary | ICD-10-CM | POA: Diagnosis not present

## 2022-07-06 DIAGNOSIS — Z7901 Long term (current) use of anticoagulants: Secondary | ICD-10-CM | POA: Diagnosis not present

## 2022-07-06 DIAGNOSIS — L97518 Non-pressure chronic ulcer of other part of right foot with other specified severity: Secondary | ICD-10-CM | POA: Diagnosis not present

## 2022-07-06 DIAGNOSIS — L97818 Non-pressure chronic ulcer of other part of right lower leg with other specified severity: Secondary | ICD-10-CM | POA: Diagnosis not present

## 2022-07-06 DIAGNOSIS — E11621 Type 2 diabetes mellitus with foot ulcer: Secondary | ICD-10-CM | POA: Diagnosis not present

## 2022-07-06 DIAGNOSIS — I2510 Atherosclerotic heart disease of native coronary artery without angina pectoris: Secondary | ICD-10-CM | POA: Diagnosis not present

## 2022-07-06 DIAGNOSIS — E1151 Type 2 diabetes mellitus with diabetic peripheral angiopathy without gangrene: Secondary | ICD-10-CM | POA: Diagnosis not present

## 2022-07-06 DIAGNOSIS — L97828 Non-pressure chronic ulcer of other part of left lower leg with other specified severity: Secondary | ICD-10-CM | POA: Diagnosis not present

## 2022-07-06 DIAGNOSIS — Z89432 Acquired absence of left foot: Secondary | ICD-10-CM | POA: Diagnosis not present

## 2022-07-06 DIAGNOSIS — L97312 Non-pressure chronic ulcer of right ankle with fat layer exposed: Secondary | ICD-10-CM | POA: Diagnosis not present

## 2022-07-06 DIAGNOSIS — Z6841 Body Mass Index (BMI) 40.0 and over, adult: Secondary | ICD-10-CM | POA: Diagnosis not present

## 2022-07-06 NOTE — Progress Notes (Addendum)
AIDAN, MOTEN ([id number]) [id number] Page 1 of 12 Visit Report for 07/06/2022 Chief Complaint Document Details Patient Name: Date of Service: [name], [name] 07/06/2022 3:00 PM Medical Record Number: [id number] Patient Account Number: [id number] Date of Birth/Sex: Treating RN: [birth year] (67 y.o. M) Primary Care Provider: [name] Other Clinician: Referring Provider: Treating Provider/Extender: Alver Fisher in Treatment: 48 Information Obtained from: Patient Chief Complaint 08/04/2021; patient returns to clinic with bilateral leg wounds as well as areas on the right foot Electronic Signature(s) Signed: 07/06/2022 3:13:51 PM By: Allen Derry PA-C Entered By: Allen Derry on 07/06/2022 15:13:50 -------------------------------------------------------------------------------- Debridement Details Patient Name: Date of Service: [name], [name] 07/06/2022 3:00 PM Medical Record Number: [id number] Patient Account Number: [id number] Date of Birth/Sex: Treating RN: 05-20-1955 (67 y.o. Dianna Limbo Primary Care Provider: [name] Other Clinician: Referring Provider: Treating Provider/Extender: Alver Fisher in Treatment: 48 Debridement Performed for Assessment: Wound #7 Right,Medial Malleolus Performed By: Physician Lenda Kelp, PA Debridement Type: Debridement Severity of Tissue Pre Debridement: Fat layer exposed Level of Consciousness (Pre-procedure): Awake and Alert Pre-procedure Verification/Time Out Yes - 15:31 Taken: Start Time: 15:31 Pain Control: Lidocaine 5% topical ointment Percent of Wound Bed Debrided: 100% T Area Debrided (cm): otal 5.65 Tissue and other material debrided: Viable, Non-Viable, Slough, Subcutaneous, Slough Level: Skin/Subcutaneous Tissue Debridement Description: Excisional Instrument: Curette Bleeding: Minimum Hemostasis Achieved: Pressure End Time: 15:33 Procedural  Pain: 0 Post Procedural Pain: 0 Response to Treatment: Procedure was tolerated well Level of Consciousness (Post- Awake and Alert procedure): Post Debridement Measurements of Total Wound Length: (cm) 4.5 Width: (cm) 1.6 Depth: (cm) 0.2 Volume: (cm) 1.131 Character of Wound/Ulcer Post Debridement: Improved [name], [name] ([id number]) [id number] Page 2 of 12 Severity of Tissue Post Debridement: Fat layer exposed Post Procedure Diagnosis Same as Pre-procedure Notes Scribed for Allen Derry III PA-C, by [employer]) Signed: 07/06/2022 4:15:09 PM By: Allen Derry PA-C Signed: 07/06/2022 4:22:46 PM By: Karie Schwalbe RN Entered By: Karie Schwalbe on 07/06/2022 15:34:53 -------------------------------------------------------------------------------- HPI Details Patient Name: Date of Service: [name], [name] 07/06/2022 3:00 PM Medical Record Number: [id number] Patient Account Number: [id number] Date of Birth/Sex: Treating RN: Oct 08, 1955 (67 y.o. M) Primary Care Provider: [name] Other Clinician: Referring Provider: Treating Provider/Extender: Alver Fisher in Treatment: 48 History of Present Illness HPI Description: ADMISSION 03/22/2021 This is a 67 year old man with a past medical history significant for diabetes type 2, congestive heart failure, peripheral arterial disease, morbid obesity, venous insufficiency, and coronary artery disease. He has been followed by Dr. Loreta Ave in podiatry, who performed a transmetatarsal amputation on the left foot in August 2022. He had issues healing that wound, but based upon Dr. Kenna Gilbert notes, ultimately the TMA wound healed. During his recovery from that surgery, however, ulcers opened up over the DIP joint of the right second and third toe. These have apparently closed and reopened multiple times. It sounds like one of the issues has been moisture accumulation and maceration of  the tissues causing them to reopen. At his last visit with Dr. Loreta Ave, on March 01, 2021, there continues to be problems with moisture and he was referred to wound care for further evaluation and management. He had a formal aortogram with runoff performed prior to his TMA. The findings are copied here: Patient has inline flow to both feet with no significant flow-limiting lesion that would be amenable to percutaneous or open revascularization. He does have an  element of small vessel disease and has a short segment occlusion of the distal anterior tibial/dorsalis pedis artery on the left foot but does have posterior tibial artery flow. Would recommend management of wounds with amputation of toes 2 and 3 on the right foot if the wounds do not heal and deteriorate. Transmetatarsal amputation on the left side has as good a blood supply as it is going to get and hopefully this will heal in the future. Formal ABIs were done in January 2023. They are normal bilaterally. ABI Findings: +---------+------------------+-----+----------+--------+ Right Rt Pressure (mmHg)IndexWaveform Comment  +---------+------------------+-----+----------+--------+ Brachial 160     +---------+------------------+-----+----------+--------+ PTA 192 1.06 monophasic  +---------+------------------+-----+----------+--------+ DP 159 0.88 monophasic  +---------+------------------+-----+----------+--------+ Great T oe145 0.80    +---------+------------------+-----+----------+--------+ +--------+------------------+-----+---------+-------+ Left Lt Pressure (mmHg)IndexWaveform Comment +--------+------------------+-----+---------+-------+ [id number]     +--------+------------------+-----+---------+-------+ PTA 204 1.13 triphasic  +--------+------------------+-----+---------+-------+ DP 194 1.07 biphasic    +--------+------------------+-----+---------+-------+ +-------+-----------+-----------+------------+------------+ ABI/TBIT oday's ABIT oday's TBIPrevious ABIPrevious TBI +-------+-----------+-----------+------------+------------+ Right 1.06 0.8 1.26 0.65  +-------+-----------+-----------+------------+------------+ Left 1.13 amputation 1.15 amputation  +-------+-----------+-----------+------------+------------+ [name], [name] ([id number]) [id number] Page 3 of 12 Previous ABI on 08/06/20 at [facility] Pedal pressures falsely elevated due to medial calcification. Summary: Right: Resting right ankle-brachial index is within normal range. The right toe-brachial index is normal. Left: Resting left ankle-brachial index is within normal range. READMISSION 08/04/2021 Mr. Gullett is now a 67 year old man who I remember from this clinic many years ago I think he had a right lower extremity predominantly venous wound at the time. He was here for 1 visit in March of this year had wounds on his right second and third toes we apparently dressed them many and they healed so he did not come back. He is listed in [place] is being a diabetic although the patient denies this says he is verified it with his primary doctor. In any case over the last several weeks or so according the patient although these wounds look somewhat more chronic than that he has developed predominantly large wounds on the right medial and right lateral ankle smaller areas on the left leg and areas on the dorsal aspect of his right second and third toes. Its not clear how he has been dressing these. 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 The patient has known PAD. He had an angiogram in August 2022 at which time he had nonhealing wounds in both feet. On the left his major vessels in the thigh were all patent. He  had three-vessel patent to the level of the ankle. He had a very short occlusion in the left anterior tibial. On the right lower extremity the major vessels in his thigh were all patent. He had three-vessel runoff to the foot sluggish filling of the anterior tibial artery. He was felt to have some component of small vessel disease but nothing that was amenable or needed revascularization. It was recommended that he have amputation of the second and third toes on the right foot if they did not heal He has been following with Dr. Loreta Ave of podiatry. Dr. Loreta Ave got him juxta lite stockings although I do not think he had them on properly he has uncontrolled edema in both legs Past medical history includes type 2 diabetes [although the patient really denies this], left TMA in 2022,lower extremity wounds in fact attendance at this clinic in 2009-2010, A-fib on Eliquis, chronic venous insufficiency with secondary lymphedema history of non-Hodgkin's lymphoma. 08-11-2021  upon evaluation today patient presents for follow-up evaluation he was seen last Wednesday initially for inspection here in our clinic. With that being said he tells me that he unfortunately has been having a lot of drainage and is actually coming through his wrap. Fortunately I do not see any evidence of active infection locally or systemically at this time which is great news. No fevers, chills, nausea, vomiting, or diarrhea. With that being said there does appear to be some evidence of local infection based on what I am seeing today. 08-18-2021 upon evaluation today patient appears to be doing okay currently in regard to his wounds with that being said that he is doing much better but still has a long ways to go to get to where he wanted to be. I think the infection is significantly improved. He has another week of the antibiotic at this point. 08-25-2021 upon evaluation today patient's wounds are actually doing decently well he has erythema has  significantly improved. I think the cellulitis is under controlled I am going to continue him on 2 more weeks of the Levaquin at 500 mg this is a lower dose but I am hoping it will be better for him. 09-08-2021 upon evaluation today patient appears to be doing excellent in regard to his wounds. Since I last saw him he was actually in the hospital where he ended up having a pacemaker put in. Subsequently he tells me that he is actually doing quite well although they were unsure whether they were going to do it due to the fact that he had the wounds on his legs. And then I am glad they did anything seem to be doing well. 09-15-2021 upon evaluation patient's wounds are actually showing signs of improvement. The right side wounds do appear to have some need for sharp debridement today and I Ernie Hew go ahead and proceed with that. I think that if we get the wounds cleaned up he will actually show signs of continued improvement. I am also leaning towards switching to [facility] which I think will be a much better option for him. 09-22-2021 patient's wounds are showing signs of excellent improvement. I am actually extremely pleased with where we stand and I think that the patient is making great progress. There does not appear to be any signs of active infection. 09-29-2021 upon evaluation today patient appears to be doing excellent in regard to his wounds. He is actually tolerating the dressing changes without complication. Fortunately I see no evidence of active infection locally or systemically at this time which is great news and overall I am extremely pleased with where we stand currently. [id number] upon evaluation today patient actually appears to be doing excellent in regard to his wounds. The left leg is almost completely healed the right ankle is significantly smaller. Overall I am extremely pleased with where we stand at this point. I do not see any evidence of active infection at this time. 10-13-2021  upon evaluation today patient appears to be doing excellent in regard to his wounds. I really feel like he is making good progress here and I am very pleased in that regard. Fortunately I do not see any signs of active infection at this time. We are using the [name] topical antibiotic therapy. 10-20-2021 upon evaluation today patient appears to be doing well with regard to his wound on the right medial ankle region the left leg is almost completely healed. I am actually very pleased with where we stand today. 11-03-2021  upon evaluation today patient's wound actually is going require some sharp debridement but appears to be doing much better which is great news. Fortunately I do not see any signs of active infection at this time. 11-10-2021 upon evaluation today patient's wound is actually showing signs of excellent improvement. Fortunately I do not see any evidence of infection locally or systemically which is great news and overall I am extremely pleased with where we stand today. I do believe he is making good progress he does have his [facility] topical antibiotics with him here today. 11-24-2021 upon evaluation today patient actually showing signs of excellent improvement this appears to be doing much better. Fortunately I do not see any evidence of infection locally or systemically at this time. 12-01-2021 upon evaluation today patient appears to be doing well currently in regard to his wound. He has been tolerating the dressing changes without complication. Fortunately I do not see any evidence of active infection at this time which is great news and overall I am extremely pleased with where we stand today. 12-15-2021 upon evaluation today patient appears to be doing well with regard to his wound. He is showing signs of improvement is slow but nonetheless we are making improvements here. Fortunately I do not see any evidence of active infection locally nor systemically at this time. We are still  using the [facility] topical antibiotic over the open area only. 12-29-2021 upon evaluation today patient's wound actually is showing signs of excellent improvement. It has been 2 weeks since I perform any debridement and it definitely shows he has some tissue that needs to be cleaned away but I think we can do so quite easily and readily today. The good news is I do not see any signs of infection I think he is doing much better in that regard. Overall I am extremely pleased with where we stand. 01-12-2022 upon evaluation today patient appears to be doing well currently in regard to his wound although it is not getting significantly smaller it is also not [name], [name] ([id number]) [id number] Page 4 of 12 getting any larger. Fortunately I do not see any evidence of infection locally nor systemically which is great news and overall I am extremely pleased with where things stand currently. 01-19-2022 upon evaluation today patient appears to be doing well currently in regard to his wound. The PolyMem actually seems to have done extremely well for him. Fortunately I do not see any signs of infection locally nor systemically at this time. 01-26-2022 upon evaluation today patient appears to be making progress. Fortunately there does not appear to be any signs of infection which is great news. No fevers, chills, nausea, vomiting, or diarrhea. With that being said this is very slow to heal and although it is smaller I still feel like we may want to try to do something to speed this up I think that a skin substitute could be beneficial, look into Kerecis. 02-02-2022 upon evaluation today patient's wound is actually showing signs of excellent improvement. I do not see any evidence of infection and overall I think that we are headed in the right direction. Fortunately I think that he is tolerating the dressing changes without complication. 02-09-2022 upon evaluation today patient appears to  be doing well currently in regard to his wound. It does look a little bit macerated but fortunately does not appear to be showing any signs of significant skin breakdown over the macerated area which is good news. Fortunately I do not see  any evidence of active infection locally nor systemically at this point which is great news. 02-23-2022 upon evaluation today patient appears to be doing well currently in regard to his wounds. In fact his area on the ankle is actually showing signs of healing quite nicely. We do have the Apligraf ready today I am hopeful this is going to speed things up and get this closed much more effectively and quickly. Fortunately I do not see any signs of active infection locally nor systemically at this time which is great news. 03-02-2022 upon evaluation today patient appears to be doing excellent in regard to his wound. He is actually been tolerating the dressing changes without complication the wound on the left medial lower extremity is doing quite well and Apligraf seems to have been extremely beneficial for him. I am extremely pleased with where we stand today. 03/16/2022: The wound measurements are smaller today. He has accumulation of a yellow crust around the edges and slough on the surface. There is a musty odor coming from the wound. He has been getting Apligraf. 03-30-2022 upon evaluation today patient appears to be making progress. In regard to his leg ulcer. This is slow but nonetheless the Apligraf has fed things up. Fortunately I do not see any evidence of infection locally nor systemically at this time. No fevers, chills, nausea, vomiting, or diarrhea. Patient is here for Apligraf #4 today. 04-13-2022 upon evaluation today patient appears to be doing a little bit more poorly in regard to his wound. He actually feels like his wrap may have been a little bit tight. Fortunately there does not appear to be any signs of active infection locally nor systemically which is  great news and I am pleased in that regard. 04-20-2022 upon evaluation today patient appears to be doing better in regard to his wound from the standpoint of swelling he is actually lost 33 pounds on torsemide in the past week his leg is significantly smaller compared to what it has been. Fortunately I do not see any signs of active infection locally nor systemically at this time. 04-27-2022 upon evaluation today patient's wound actually is showing signs of erythema and warmth around the edges of the wound I am actually concerned about the possibility of infection. I think we probably need to obtain a wound culture and also can recommend based on what we are seeing that we go ahead and have the patient continue to utilize the compression wrapping which I think has been of benefit. 05-04-2022 upon evaluation today patient appears to be doing well currently in regard to his wound. He has been tolerating the dressing changes without complication. Fortunately there does not appear to be any signs of active infection locally or systemically which is great news. 05-11-2022 upon evaluation today patient actually appears to be doing excellent. He has been tolerating the dressing changes we actually were trialing a new medication on him last week and this was the ergo clean Ag dressing. With that being said this actually seems to have done extremely well at this point. Fortunately I do not see any signs of active infection locally or systemically at this time. 05-18-2022 upon evaluation today patient appears to be doing okay in regard to his wound I do not see any signs of worsening also notes any signs of significant improvement. Fortunately I do believe that removing in the right direction in general. 05-25-22 upon evaluation today patient appears to be doing well currently in regard to his wound. He has  been tolerating the dressing changes without complication. Fortunately I do not see any signs of active infection  locally nor systemically which is great news and overall I am extremely pleased with where we stand currently. There does not appear to be any signs of active infection locally nor systemically which is great news. No fevers, chills, nausea, vomiting, or diarrhea. 06-01-2022 upon evaluation today patient actually appears to be making excellent progress in regard to his ankle region. I am very pleased with where we stand I think that he is doing extremely well and very pleased with endoform and drawtex combination. 06-08-2022 upon evaluation today patient appears to be doing well currently in regard to his wound. He has been making good progress and I feel like practicing improvement in the overall size of the wound. I am very pleased in that regard. Fortunately I do not see any signs of active infection locally nor systemically which is great news. No fevers, chills, nausea, vomiting, or diarrhea. 06-15-2022 upon evaluation today patient appears to be doing well currently in regard to his wound. He has been tolerating the dressing changes without complication. I do believe the endoform has been extremely helpful for him up to this point. I do not see any signs of active infection locally nor systemically which is great news and in general I do believe that we are making pretty good progress here which is excellent news. 06-22-2022 upon evaluation today patient appears to be doing well currently in regard to his wound. He has been tolerating the dressing changes without complication. Fortunately there does not appear to be any signs of active infection at this time which is great news. I think that he is actually taking good progress with the endoform which is excellent as well. 06-29-2022 upon evaluation patient appears to be doing well currently in regard to his wound. He is actually making good progress towards closure and I am very pleased in that regard. I do not see any evidence of active infection  locally nor systemically which is great news and in general I do believe that we are making excellent progress here overall. 07-06-2022 upon evaluation today patient appears to be doing well currently in regard to his wound. He has been tolerating the dressing changes without complication. The good news is he is slowly getting smaller each time I see him. Electronic Signature(s) Signed: 07/06/2022 4:13:06 PM By: Allen Derry PA-C Entered By: Allen Derry on 07/06/2022 16:13:05 [name], [name] ([id number]) [id number] Page 5 of 12 -------------------------------------------------------------------------------- Physical Exam Details Patient Name: Date of Service: [name], [name] 07/06/2022 3:00 PM Medical Record Number: [id number] Patient Account Number: [id number] Date of Birth/Sex: Treating RN: April 15, 1955 (67 y.o. M) Primary Care Provider: [name] Other Clinician: Referring Provider: Treating Provider/Extender: Alver Fisher in Treatment: 48 Constitutional Well-nourished and well-hydrated in no acute distress. Respiratory normal breathing without difficulty. Psychiatric this patient is able to make decisions and demonstrates good insight into disease process. Alert and Oriented x 3. pleasant and cooperative. Notes Upon inspection patient's wound bed actually showed signs of good granulation epithelization at this point. Fortunately I do not see any evidence of worsening overall and I do believe that we are making good headway towards closure. I do not see any signs of active infection at this point. Electronic Signature(s) Signed: 07/06/2022 4:13:27 PM By: Allen Derry PA-C Entered By: Allen Derry on 07/06/2022 16:13:26 -------------------------------------------------------------------------------- Physician Orders Details Patient Name: Date of Service: [name], [name]  07/06/2022 3:00 PM Medical Record Number: [id number] Patient Account  Number: [id number] Date of Birth/Sex: Treating RN: 04/27/1955 (67 y.o. Dianna Limbo Primary Care Provider: [name] Other Clinician: Referring Provider: Treating Provider/Extender: Alver Fisher in Treatment: [phone] Verbal / Phone Orders: No Diagnosis Coding ICD-10 Coding Code Description I89.0 Lymphedema, not elsewhere classified I87.333 Chronic venous hypertension (idiopathic) with ulcer and inflammation of bilateral lower extremity L97.828 Non-pressure chronic ulcer of other part of left lower leg with other specified severity L97.818 Non-pressure chronic ulcer of other part of right lower leg with other specified severity E11.621 Type 2 diabetes mellitus with foot ulcer L97.518 Non-pressure chronic ulcer of other part of right foot with other specified severity Follow-up Appointments ppointment in 1 week. [facility] room 8 07/13/2022 1500 Return A Anesthetic (In clinic) Topical Lidocaine 5% applied to wound bed - Used in Clinic Prior to debridement Cellular or Tissue Based Products Cellular or Tissue Based Product Type: - Run IVR for Kerrecis- denied #1 Apligraf applied 02/23/2022 #2 Apligraf applied 03/02/2022 #3 Apligraf applied 03/16/2022 #4 Apligraf applied 03/30/2022 Luczynski, Deondrick [name] ([id number]) [id number] Page 6 of 12 HOLD APLIGRAF this week. Cellular or Tissue Based Product applied to wound bed, secured with steri-strips, cover with Adaptic or Mepitel. (DO NOT REMOVE). Bathing/ Shower/ Hygiene May shower with protection but do not get wound dressing(s) wet. Protect dressing(s) with water repellant cover (for example, large plastic bag) or a cast cover and may then take shower. Edema Control - Lymphedema / SCD / Other Elevate legs to the level of the heart or above for 30 minutes daily and/or when sitting for 3-4 times a day throughout the day. Avoid standing for long periods of time. Exercise regularly Moisturize legs daily.  - apply every night before bed to left leg. Compression stocking or Garment 30-40 mm/Hg pressure to: - wear your VIVE compression stocking to left leg. apply in the morning and remove at night. Wound Treatment Wound #7 - Malleolus Wound Laterality: Right, Medial Peri-Wound Care: Zinc Oxide Ointment 30g tube 1 x Per Week/30 Days Discharge Instructions: Apply Zinc Oxide to periwound with each dressing change Peri-Wound Care: Sween Lotion (Moisturizing lotion) 1 x Per Week/30 Days Discharge Instructions: Apply moisturizing lotion as directed Topical: ketoconazole and TCA cream 1 x Per Week/30 Days Discharge Instructions: As needed ****APPLY TO THE IRRITATED SKIN AT UPPER PORTION OF LOWER LEG.**** Prim Dressing: Endoform 2x2 in 1 x Per Week/30 Days ary Discharge Instructions: Moisten with saline Secondary Dressing: Drawtex 4x4 in 1 x Per Week/30 Days Discharge Instructions: Apply over primary dressing as directed. Secondary Dressing: Optifoam Non-Adhesive Dressing, 4x4 in 1 x Per Week/30 Days Discharge Instructions: Apply medial and anterior lower leg with compression wrap. Secondary Dressing: Woven Gauze Sponge, Non-Sterile 4x4 in 1 x Per Week/30 Days Discharge Instructions: Apply over primary dressing as directed. Secondary Dressing: Zetuvit Plus 4x8 in 1 x Per Week/30 Days Discharge Instructions: Apply over primary dressing as directed. Compression Wrap: Urgo K2, (equivalent to a 4 layer) two layer compression system, regular 1 x Per Week/30 Days Discharge Instructions: Apply Urgo K2 as directed (alternative to 4 layer compression). Electronic Signature(s) Signed: 07/06/2022 4:15:09 PM By: Allen Derry PA-C Signed: 07/06/2022 4:22:46 PM By: Karie Schwalbe RN Entered By: Karie Schwalbe on 07/06/2022 15:36:15 -------------------------------------------------------------------------------- Problem List Details Patient Name: Date of Service: [name], [name] 07/06/2022 3:00 PM Medical Record  Number: [id number] Patient Account Number: [id number] Date of Birth/Sex: Treating RN: 02/24/1955 (67 y.o. M) Primary Care  Provider: [name] Other Clinician: Referring Provider: Treating Provider/Extender: Alver Fisher in Treatment: 48 Active Problems ICD-10 Encounter Code Description Active Date MDM Diagnosis I89.0 Lymphedema, not elsewhere classified 08/04/2021 No Yes Mccarrick, Mohamed [name] ([id number]) [id number] Page 7 of 12 [phone] Chronic venous hypertension (idiopathic) with ulcer and inflammation of 08/04/2021 No Yes bilateral lower extremity L97.828 Non-pressure chronic ulcer of other part of left lower leg with other specified 08/04/2021 No Yes severity L97.818 Non-pressure chronic ulcer of other part of right lower leg with other specified 08/04/2021 No Yes severity E11.621 Type 2 diabetes mellitus with foot ulcer 08/04/2021 No Yes L97.518 Non-pressure chronic ulcer of other part of right foot with other specified 08/04/2021 No Yes severity Inactive Problems Resolved Problems Electronic Signature(s) Signed: 07/06/2022 3:13:40 PM By: Allen Derry PA-C Entered By: Allen Derry on 07/06/2022 15:13:40 -------------------------------------------------------------------------------- Progress Note Details Patient Name: Date of Service: [name], [name] 07/06/2022 3:00 PM Medical Record Number: [id number] Patient Account Number: [id number] Date of Birth/Sex: Treating RN: 1955-08-19 (67 y.o. M) Primary Care Provider: [name] Other Clinician: Referring Provider: Treating Provider/Extender: Alver Fisher in Treatment: 48 Subjective Chief Complaint Information obtained from Patient 08/04/2021; patient returns to clinic with bilateral leg wounds as well as areas on the right foot History of Present Illness (HPI) ADMISSION 03/22/2021 This is a 67 year old man with a past medical history significant for diabetes  type 2, congestive heart failure, peripheral arterial disease, morbid obesity, venous insufficiency, and coronary artery disease. He has been followed by Dr. Loreta Ave in podiatry, who performed a transmetatarsal amputation on the left foot in August 2022. He had issues healing that wound, but based upon Dr. Kenna Gilbert notes, ultimately the TMA wound healed. During his recovery from that surgery, however, ulcers opened up over the DIP joint of the right second and third toe. These have apparently closed and reopened multiple times. It sounds like one of the issues has been moisture accumulation and maceration of the tissues causing them to reopen. At his last visit with Dr. Loreta Ave, on March 01, 2021, there continues to be problems with moisture and he was referred to wound care for further evaluation and management. He had a formal aortogram with runoff performed prior to his TMA. The findings are copied here: Patient has inline flow to both feet with no significant flow-limiting lesion that would be amenable to percutaneous or open revascularization. He does have an element of small vessel disease and has a short segment occlusion of the distal anterior tibial/dorsalis pedis artery on the left foot but does have posterior tibial artery flow. Would recommend management of wounds with amputation of toes 2 and 3 on the right foot if the wounds do not heal and deteriorate. Transmetatarsal amputation on the left side has as good a blood supply as it is going to get and hopefully this will heal in the future. Formal ABIs were done in January 2023. They are normal bilaterally. ABI Findings: +---------+------------------+-----+----------+--------+ Right Rt Pressure (mmHg)IndexWaveform Comment  +---------+------------------+-----+----------+--------+ Brachial 160    Jolin, Rocky [name] ([id number]) [id number] Page 8 of  12 +---------+------------------+-----+----------+--------+ PTA 192 1.06 monophasic  +---------+------------------+-----+----------+--------+ DP 159 0.88 monophasic  +---------+------------------+-----+----------+--------+ Great T oe145 0.80    +---------+------------------+-----+----------+--------+ +--------+------------------+-----+---------+-------+ Left Lt Pressure (mmHg)IndexWaveform Comment +--------+------------------+-----+---------+-------+ [id number]    +--------+------------------+-----+---------+-------+ PTA 204 1.13 triphasic  +--------+------------------+-----+---------+-------+ DP 194 1.07 biphasic   +--------+------------------+-----+---------+-------+ +-------+-----------+-----------+------------+------------+ ABI/TBIT oday's ABIT oday's TBIPrevious ABIPrevious TBI +-------+-----------+-----------+------------+------------+ Right 1.06 0.8 1.26 0.65  +-------+-----------+-----------+------------+------------+ Left 1.13 amputation 1.15  amputation  +-------+-----------+-----------+------------+------------+ Previous ABI on 08/06/20 at [facility] Pedal pressures falsely elevated due to medial calcification. Summary: Right: Resting right ankle-brachial index is within normal range. The right toe-brachial index is normal. Left: Resting left ankle-brachial index is within normal range. READMISSION 08/04/2021 Mr. Lortie is now a 67 year old man who I remember from this clinic many years ago I think he had a right lower extremity predominantly venous wound at the time. He was here for 1 visit in March of this year had wounds on his right second and third toes we apparently dressed them many and they healed so he did not come back. He is listed in [place] is being a diabetic although the patient denies this says he is verified it with his primary doctor. In any case over the last several weeks or so according the  patient although these wounds look somewhat more chronic than that he has developed predominantly large wounds on the right medial and right lateral ankle smaller areas on the left leg and areas on the dorsal aspect of his right second and third toes. Its not clear how he has been dressing these. 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 The patient has known PAD. He had an angiogram in August 2022 at which time he had nonhealing wounds in both feet. On the left his major vessels in the thigh were all patent. He had three-vessel patent to the level of the ankle. He had a very short occlusion in the left anterior tibial. On the right lower extremity the major vessels in his thigh were all patent. He had three-vessel runoff to the foot sluggish filling of the anterior tibial artery. He was felt to have some component of small vessel disease but nothing that was amenable or needed revascularization. It was recommended that he have amputation of the second and third toes on the right foot if they did not heal He has been following with Dr. Loreta Ave of podiatry. Dr. Loreta Ave got him juxta lite stockings although I do not think he had them on properly he has uncontrolled edema in both legs Past medical history includes type 2 diabetes [although the patient really denies this], left TMA in 2022,lower extremity wounds in fact attendance at this clinic in 2009-2010, A-fib on Eliquis, chronic venous insufficiency with secondary lymphedema history of non-Hodgkin's lymphoma. 08-11-2021 upon evaluation today patient presents for follow-up evaluation he was seen last Wednesday initially for inspection here in our clinic. With that being said he tells me that he unfortunately has been having a lot of drainage and is actually coming through his wrap. Fortunately I do not see any evidence of active infection locally or systemically at this time  which is great news. No fevers, chills, nausea, vomiting, or diarrhea. With that being said there does appear to be some evidence of local infection based on what I am seeing today. 08-18-2021 upon evaluation today patient appears to be doing okay currently in regard to his wounds with that being said that he is doing much better but still has a long ways to go to get to where he wanted to be. I think the infection is significantly improved. He has another week of the antibiotic at this point. 08-25-2021 upon evaluation today patient's wounds are actually doing decently well he has erythema has significantly improved. I think the cellulitis is under controlled I am going to continue him on  2 more weeks of the Levaquin at 500 mg this is a lower dose but I am hoping it will be better for him. 09-08-2021 upon evaluation today patient appears to be doing excellent in regard to his wounds. Since I last saw him he was actually in the hospital where he ended up having a pacemaker put in. Subsequently he tells me that he is actually doing quite well although they were unsure whether they were going to do it due to the fact that he had the wounds on his legs. And then I am glad they did anything seem to be doing well. 09-15-2021 upon evaluation patient's wounds are actually showing signs of improvement. The right side wounds do appear to have some need for sharp debridement today and I Ernie Hew go ahead and proceed with that. I think that if we get the wounds cleaned up he will actually show signs of continued improvement. I am also leaning towards switching to [facility] [address] [facility] [address] which I think will be a much better option for him. 09-22-2021 patient's wounds are showing signs of excellent improvement. I am actually extremely pleased with where we stand and I think that the patient is making great progress. There does not appear to be any signs of active infection. 09-29-2021 upon evaluation today patient appears to be doing  excellent in regard to his wounds. He is actually tolerating the dressing changes without complication. Fortunately I see no evidence of active infection locally or systemically at this time which is great news and overall I am extremely pleased with where we stand currently. [id number] upon evaluation today patient actually appears to be doing excellent in regard to his wounds. The left leg is almost completely healed the right ankle is significantly smaller. Overall I am extremely pleased with where we stand at this point. I do not see any evidence of active infection at this time. 10-13-2021 upon evaluation today patient appears to be doing excellent in regard to his wounds. I really feel like he is making good progress here and I am very [name], GOTTWALD [name] ([id number]) [id number] Page 9 of 12 pleased in that regard. Fortunately I do not see any signs of active infection at this time. We are using the [facility] topical antibiotic therapy. 10-20-2021 upon evaluation today patient appears to be doing well with regard to his wound on the right medial ankle region the left leg is almost completely healed. I am actually very pleased with where we stand today. 11-03-2021 upon evaluation today patient's wound actually is going require some sharp debridement but appears to be doing much better which is great news. Fortunately I do not see any signs of active infection at this time. 11-10-2021 upon evaluation today patient's wound is actually showing signs of excellent improvement. Fortunately I do not see any evidence of infection locally or systemically which is great news and overall I am extremely pleased with where we stand today. I do believe he is making good progress he does have his [facility] topical antibiotics with him here today. 11-24-2021 upon evaluation today patient actually showing signs of excellent improvement this appears to be doing much better. Fortunately I do not see  any evidence of infection locally or systemically at this time. 12-01-2021 upon evaluation today patient appears to be doing well currently in regard to his wound. He has been tolerating the dressing changes without complication. Fortunately I do not see any evidence of active infection at this time which is great news and overall I  am extremely pleased with where we stand today. 12-15-2021 upon evaluation today patient appears to be doing well with regard to his wound. He is showing signs of improvement is slow but nonetheless we are making improvements here. Fortunately I do not see any evidence of active infection locally nor systemically at this time. We are still using the [facility] topical antibiotic over the open area only. 12-29-2021 upon evaluation today patient's wound actually is showing signs of excellent improvement. It has been 2 weeks since I perform any debridement and it definitely shows he has some tissue that needs to be cleaned away but I think we can do so quite easily and readily today. The good news is I do not see any signs of infection I think he is doing much better in that regard. Overall I am extremely pleased with where we stand. 01-12-2022 upon evaluation today patient appears to be doing well currently in regard to his wound although it is not getting significantly smaller it is also not getting any larger. Fortunately I do not see any evidence of infection locally nor systemically which is great news and overall I am extremely pleased with where things stand currently. 01-19-2022 upon evaluation today patient appears to be doing well currently in regard to his wound. The PolyMem actually seems to have done extremely well for him. Fortunately I do not see any signs of infection locally nor systemically at this time. 01-26-2022 upon evaluation today patient appears to be making progress. Fortunately there does not appear to be any signs of infection which is great news. No  fevers, chills, nausea, vomiting, or diarrhea. With that being said this is very slow to heal and although it is smaller I still feel like we may want to try to do something to speed this up I think that a skin substitute could be beneficial, look into Kerecis. 02-02-2022 upon evaluation today patient's wound is actually showing signs of excellent improvement. I do not see any evidence of infection and overall I think that we are headed in the right direction. Fortunately I think that he is tolerating the dressing changes without complication. 02-09-2022 upon evaluation today patient appears to be doing well currently in regard to his wound. It does look a little bit macerated but fortunately does not appear to be showing any signs of significant skin breakdown over the macerated area which is good news. Fortunately I do not see any evidence of active infection locally nor systemically at this point which is great news. 02-23-2022 upon evaluation today patient appears to be doing well currently in regard to his wounds. In fact his area on the ankle is actually showing signs of healing quite nicely. We do have the Apligraf ready today I am hopeful this is going to speed things up and get this closed much more effectively and quickly. Fortunately I do not see any signs of active infection locally nor systemically at this time which is great news. 03-02-2022 upon evaluation today patient appears to be doing excellent in regard to his wound. He is actually been tolerating the dressing changes without complication the wound on the left medial lower extremity is doing quite well and Apligraf seems to have been extremely beneficial for him. I am extremely pleased with where we stand today. 03/16/2022: The wound measurements are smaller today. He has accumulation of a yellow crust around the edges and slough on the surface. There is a musty odor coming from the wound. He has been getting  Apligraf. 03-30-2022 upon  evaluation today patient appears to be making progress. In regard to his leg ulcer. This is slow but nonetheless the Apligraf has fed things up. Fortunately I do not see any evidence of infection locally nor systemically at this time. No fevers, chills, nausea, vomiting, or diarrhea. Patient is here for Apligraf #4 today. 04-13-2022 upon evaluation today patient appears to be doing a little bit more poorly in regard to his wound. He actually feels like his wrap may have been a little bit tight. Fortunately there does not appear to be any signs of active infection locally nor systemically which is great news and I am pleased in that regard. 04-20-2022 upon evaluation today patient appears to be doing better in regard to his wound from the standpoint of swelling he is actually lost 33 pounds on torsemide in the past week his leg is significantly smaller compared to what it has been. Fortunately I do not see any signs of active infection locally nor systemically at this time. 04-27-2022 upon evaluation today patient's wound actually is showing signs of erythema and warmth around the edges of the wound I am actually concerned about the possibility of infection. I think we probably need to obtain a wound culture and also can recommend based on what we are seeing that we go ahead and have the patient continue to utilize the compression wrapping which I think has been of benefit. 05-04-2022 upon evaluation today patient appears to be doing well currently in regard to his wound. He has been tolerating the dressing changes without complication. Fortunately there does not appear to be any signs of active infection locally or systemically which is great news. 05-11-2022 upon evaluation today patient actually appears to be doing excellent. He has been tolerating the dressing changes we actually were trialing a new medication on him last week and this was the ergo clean Ag dressing. With that being said this actually  seems to have done extremely well at this point. Fortunately I do not see any signs of active infection locally or systemically at this time. 05-18-2022 upon evaluation today patient appears to be doing okay in regard to his wound I do not see any signs of worsening also notes any signs of significant improvement. Fortunately I do believe that removing in the right direction in general. 05-25-22 upon evaluation today patient appears to be doing well currently in regard to his wound. He has been tolerating the dressing changes without complication. Fortunately I do not see any signs of active infection locally nor systemically which is great news and overall I am extremely pleased with where we stand currently. There does not appear to be any signs of active infection locally nor systemically which is great news. No fevers, chills, nausea, vomiting, or diarrhea. 06-01-2022 upon evaluation today patient actually appears to be making excellent progress in regard to his ankle region. I am very pleased with where we stand I think that he is doing extremely well and very pleased with endoform and drawtex combination. 06-08-2022 upon evaluation today patient appears to be doing well currently in regard to his wound. He has been making good progress and I feel like practicing improvement in the overall size of the wound. I am very pleased in that regard. Fortunately I do not see any signs of active infection locally nor systemically which is great news. No fevers, chills, nausea, vomiting, or diarrhea. BABAK, LUCUS ([id number]) [id number] Page 10 of 12 06-15-2022 upon evaluation today patient  appears to be doing well currently in regard to his wound. He has been tolerating the dressing changes without complication. I do believe the endoform has been extremely helpful for him up to this point. I do not see any signs of active infection locally nor systemically which is great news and in  general I do believe that we are making pretty good progress here which is excellent news. 06-22-2022 upon evaluation today patient appears to be doing well currently in regard to his wound. He has been tolerating the dressing changes without complication. Fortunately there does not appear to be any signs of active infection at this time which is great news. I think that he is actually taking good progress with the endoform which is excellent as well. 06-29-2022 upon evaluation patient appears to be doing well currently in regard to his wound. He is actually making good progress towards closure and I am very pleased in that regard. I do not see any evidence of active infection locally nor systemically which is great news and in general I do believe that we are making excellent progress here overall. 07-06-2022 upon evaluation today patient appears to be doing well currently in regard to his wound. He has been tolerating the dressing changes without complication. The good news is he is slowly getting smaller each time I see him. Objective Constitutional Well-nourished and well-hydrated in no acute distress. Vitals Time Taken: 2:56 PM, Height: 71 in, Weight: 350 lbs, BMI: 48.8, Temperature: 97.9 F, Pulse: 96 bpm, Respiratory Rate: 18 breaths/min, Blood Pressure: 127/84 mmHg. Respiratory normal breathing without difficulty. Psychiatric this patient is able to make decisions and demonstrates good insight into disease process. Alert and Oriented x 3. pleasant and cooperative. General Notes: Upon inspection patient's wound bed actually showed signs of good granulation epithelization at this point. Fortunately I do not see any evidence of worsening overall and I do believe that we are making good headway towards closure. I do not see any signs of active infection at this point. Integumentary (Hair, Skin) Wound #7 status is Open. Original cause of wound was Blister. The date acquired was: 07/21/2021. The  wound has been in treatment 48 weeks. The wound is located on the Right,Medial Malleolus. The wound measures 4.5cm length x 1.6cm width x 0.2cm depth; 5.655cm^2 area and 1.131cm^3 volume. There is Fat Layer (Subcutaneous Tissue) exposed. There is no tunneling or undermining noted. There is a medium amount of serosanguineous drainage noted. The wound margin is distinct with the outline attached to the wound base. There is large (67-100%) red, pink granulation within the wound bed. There is a small (1-33%) amount of necrotic tissue within the wound bed including Adherent Slough. The periwound skin appearance exhibited: Scarring, Hemosiderin Staining. The periwound skin appearance did not exhibit: Callus, Crepitus, Excoriation, Induration, Rash, Dry/Scaly, Maceration, Atrophie Blanche, Cyanosis, Ecchymosis, Mottled, Pallor, Rubor, Erythema. 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 There was a Excisional Skin/Subcutaneous Tissue Debridement with a total area of 5.65 sq  cm performed by Lenda Kelp, PA. With the following instrument(s): Curette to remove Viable and Non-Viable tissue/material. Material removed includes Subcutaneous Tissue and Slough and after achieving pain control using Lidocaine 5% topical ointment. No specimens were taken. A time out was conducted at 15:31, prior to the start of the  procedure. A Minimum amount of bleeding was controlled with Pressure. The procedure was tolerated well with a pain level of 0 throughout and a pain level of 0 following the procedure. Post Debridement Measurements: 4.5cm length x 1.6cm width x 0.2cm depth; 1.131cm^3 volume. Character of Wound/Ulcer Post Debridement is improved. Severity of Tissue Post Debridement is: Fat layer exposed. Post procedure Diagnosis Wound #7: Same as Pre-Procedure General Notes: Scribed for [employer] PA-C, by J.Scotton. Pre-procedure diagnosis of Wound #7 is a Venous Leg Ulcer located on the Right,Medial Malleolus . There was a Four Layer Compression Therapy Procedure by Karie Schwalbe, RN. Post procedure Diagnosis Wound #7: Same as Pre-Procedure JAMAR, WEATHERALL [name] ([id number]) [id number] Page 11 of 12 Notes: Or URGO K2. Plan Follow-up Appointments: Return Appointment in 1 week. [facility] room 8 07/13/2022 1500 Anesthetic: (In clinic) Topical Lidocaine 5% applied to wound bed - Used in Clinic Prior to debridement Cellular or Tissue Based Products: Cellular or Tissue Based Product Type: - Run IVR for Kerrecis- denied #1 Apligraf applied 02/23/2022 #2 Apligraf applied 03/02/2022 #3 Apligraf applied 03/16/2022 #4 Apligraf applied 03/30/2022 HOLD APLIGRAF this week. Cellular or Tissue Based Product applied to wound bed, secured with steri-strips, cover with Adaptic or Mepitel. (DO NOT REMOVE). Bathing/ Shower/ Hygiene: May shower with protection but do not get wound dressing(s) wet. Protect dressing(s) with water repellant cover (for example, large plastic bag) or a cast cover and may then take shower. Edema Control - Lymphedema / SCD / Other: Elevate legs to the level of the heart or above for 30 minutes daily and/or when sitting for 3-4 times a day throughout the day. Avoid standing for long periods of time. Exercise regularly Moisturize legs daily. - apply every night before bed to left  leg. Compression stocking or Garment 30-40 mm/Hg pressure to: - wear your VIVE compression stocking to left leg. apply in the morning and remove at night. WOUND #7: - Malleolus Wound Laterality: Right, Medial Peri-Wound Care: Zinc Oxide Ointment 30g tube 1 x Per Week/30 Days Discharge Instructions: Apply Zinc Oxide to periwound with each dressing change Peri-Wound Care: Sween Lotion (Moisturizing lotion) 1 x Per Week/30 Days Discharge Instructions: Apply moisturizing lotion as directed Topical: ketoconazole and TCA cream 1 x Per Week/30 Days Discharge Instructions: As needed ****APPLY TO THE IRRITATED SKIN AT UPPER PORTION OF LOWER LEG.**** Prim Dressing: Endoform 2x2 in 1 x Per Week/30 Days ary Discharge Instructions: Moisten with saline Secondary Dressing: Drawtex 4x4 in 1 x Per Week/30 Days Discharge Instructions: Apply over primary dressing as directed. Secondary Dressing: Optifoam Non-Adhesive Dressing, 4x4 in 1 x Per Week/30 Days Discharge Instructions: Apply medial and anterior lower leg with compression wrap. Secondary Dressing: Woven Gauze Sponge, Non-Sterile 4x4 in 1 x Per Week/30 Days Discharge Instructions: Apply over primary dressing as directed. Secondary Dressing: Zetuvit Plus 4x8 in 1 x Per Week/30 Days Discharge Instructions: Apply over primary dressing as directed. Com pression Wrap: Urgo K2, (equivalent to a 4 layer) two layer compression system, regular 1 x Per Week/30 Days Discharge Instructions: Apply Urgo K2 as directed (alternative to 4 layer compression). 1. I would recommend currently that we have the patient continue to monitor for any  signs of infection or worsening. Based on what I am seeing I do believe that we are making good headway towards closure. 2. I would recommend as well we continue with endoform followed by drawtex which I think is doing quite well. We will see patient back for reevaluation in 1 week here in the clinic. If anything worsens or changes  patient will contact our office for additional recommendations. Electronic Signature(s) Signed: 07/06/2022 4:13:52 PM By: Allen Derry PA-C Entered By: Allen Derry on 07/06/2022 16:13:52 -------------------------------------------------------------------------------- SuperBill Details Patient Name: Date of Service: Czaplicki, [name] 07/06/2022 Medical Record Number: [id number] Patient Account Number: [id number] Date of Birth/Sex: Treating RN: 02/19/1955 (67 y.o. M) Primary Care Provider: [name] Other Clinician: Referring Provider: Treating Provider/Extender: Alver Fisher in Treatment: 48 Diagnosis Coding ICD-10 Codes Code Description I89.0 Lymphedema, not elsewhere classified I87.333 Chronic venous hypertension (idiopathic) with ulcer and inflammation of bilateral lower extremity GONZALO, WAYMIRE [name] ([id number]) [id number] Page 12 of 12 [phone] Non-pressure chronic ulcer of other part of left lower leg with other specified severity L97.818 Non-pressure chronic ulcer of other part of right lower leg with other specified severity E11.621 Type 2 diabetes mellitus with foot ulcer L97.518 Non-pressure chronic ulcer of other part of right foot with other specified severity Facility Procedures : 3 CPT4 Code: 1884166 Description: 11042 - DEB SUBQ TISSUE 20 SQ CM/< ICD-10 Diagnosis Description L97.818 Non-pressure chronic ulcer of other part of right lower leg with other specified Modifier: severity Quantity: 1 Physician Procedures : CPT4 Code Description Modifier [id number] 11042 - WC PHYS SUBQ TISS 20 SQ CM ICD-10 Diagnosis Description L97.818 Non-pressure chronic ulcer of other part of right lower leg with other specified severity Quantity: 1 Electronic Signature(s) Signed: 07/06/2022 4:14:03 PM By: Allen Derry PA-C Entered By: Allen Derry on 07/06/2022 16:14:03

## 2022-07-08 NOTE — Progress Notes (Signed)
BEAU, IRAHETA ([id number]) [id number] Page 1 of 6 Visit Report for 07/06/2022 Arrival Information Details Patient Name: Date of Service: [name], [name] 07/06/2022 3:00 PM Medical Record Number: [id number] Patient Account Number: [id number] Date of Birth/Sex: Treating RN: [birth year] (67 y.o. M) Primary Care [name]: [name] Other Clinician: Referring [name]: Treating [name]/Extender: [name] in Treatment: 48 Visit Information History Since Last Visit Added or deleted any medications: No Patient Arrived: [name] Any new allergies or adverse reactions: No Arrival Time: 14:56 Had a fall or experienced change in No Accompanied By: self activities of daily living that may affect Transfer Assistance: None risk of falls: Patient Identification Verified: Yes Signs or symptoms of abuse/neglect since last visito No Secondary Verification Process Completed: Yes Hospitalized since last visit: No Patient Requires Transmission-Based Precautions: No Implantable device outside of the clinic excluding No Patient Has Alerts: Yes cellular tissue based products placed in the center Patient Alerts: Patient on Blood Thinner since last visit: 01/2021 ABI L 1.13 R 1.06 Has Dressing in Place as Prescribed: Yes 01/2021 TBI L amp R 0.8 Pain Present Now: No Electronic Signature(s) Signed: 07/08/2022 9:36:46 AM By: [name] Entered By: [name] on 07/06/2022 14:56:33 -------------------------------------------------------------------------------- Compression Therapy Details Patient Name: Date of Service: [name], [name] [name]. 07/06/2022 3:00 PM Medical Record Number: [id number] Patient Account Number: [id number] Date of Birth/Sex: Treating RN: [birth year] [name] (67 y.o. [name] Primary Care [name]: [name] Other Clinician: Referring [name]: Treating [name]/Extender: [name] in Treatment:  48 Compression Therapy Performed for Wound Assessment: Wound #7 Right,Medial Malleolus Performed By: Clinician Karie Schwalbe, RN Compression Type: Four Layer Post Procedure Diagnosis Same as Pre-procedure Notes Or Harlene Salts Electronic Signature(s) Signed: 07/06/2022 4:22:46 PM By: Karie Schwalbe RN Entered By: Karie Schwalbe on 07/06/2022 15:35:23 [name], [name] ([id number]) [id number] Page 2 of 6 -------------------------------------------------------------------------------- Encounter Discharge Information Details Patient Name: Date of Service: [name], [name] 07/06/2022 3:00 PM Medical Record Number: [id number] Patient Account Number: [id number] Date of Birth/Sex: Treating RN: [name] (67 y.o. [name] Primary Care Jataya Wann: [name] Other Clinician: Referring Emslee Lopezmartinez: Treating [name]/Extender: [name] in Treatment: [phone] Encounter Discharge Information Items Post Procedure Vitals Discharge Condition: Stable Temperature (F): 97.9 Ambulatory Status: Walker Pulse (bpm): 96 Discharge Destination: Home Respiratory Rate (breaths/min): 18 Transportation: Private Auto Blood Pressure (mmHg): 127/84 Accompanied By: self Schedule Follow-up Appointment: Yes Clinical Summary of Care: Patient Declined Electronic Signature(s) Signed: 07/06/2022 4:22:46 PM By: Karie Schwalbe RN Entered By: Karie Schwalbe on 07/06/2022 15:46:26 -------------------------------------------------------------------------------- Lower Extremity Assessment Details Patient Name: Date of Service: [name], [name]. 07/06/2022 3:00 PM Medical Record Number: [id number] Patient Account Number: [id number] Date of Birth/Sex: Treating RN: [birth year] (67 y.o. [name] Primary Care Deron Poole: [name] Other Clinician: Referring [name]: Treating [name]/Extender: [name] in Treatment: 48 Edema  Assessment Assessed: [name]: No] Franne Forts: No] Edema: [Left: N] [Right: o] Calf Left: Right: Point of Measurement: 31 cm From Medial Instep 38 cm Ankle Left: Right: Point of Measurement: 11 cm From Medial Instep 25 cm Vascular Assessment Pulses: Dorsalis Pedis Palpable: [Right:Yes] Electronic Signature(s) Signed: 07/06/2022 4:22:46 PM By: Karie Schwalbe RN Entered By: Karie Schwalbe on 07/06/2022 15:26:49 [name], [name] ([id number]) [id number] Page 3 of 6 -------------------------------------------------------------------------------- Multi-Disciplinary Care Plan Details Patient Name: Date of Service: [name], [name] 07/06/2022 3:00 PM Medical Record Number: [id number] Patient Account Number: [id number] Date of Birth/Sex: Treating RN: [birth year] [name] (  67 y.o. [name] Primary Care [name]: [name] Other Clinician: Referring [name]: Treating [name]/Extender: [name] in Treatment: 48 Multidisciplinary Care Plan reviewed with physician Active Inactive Pain, Acute or Chronic Nursing Diagnoses: Pain, acute or chronic: actual or potential Potential alteration in comfort, pain Goals: Patient will verbalize adequate pain control and receive pain control interventions during procedures as needed Date Initiated: 08/04/2021 Target Resolution Date: 09/10/2022 Goal Status: Active Patient/caregiver will verbalize comfort level met Date Initiated: 08/04/2021 Target Resolution Date: 09/10/2022 Goal Status: Active Interventions: Encourage patient to take pain medications as prescribed Provide education on pain management Reposition patient for comfort Treatment Activities: Administer pain control measures as ordered : 08/04/2021 Notes: Electronic Signature(s) Signed: 07/06/2022 4:22:46 PM By: Karie Schwalbe RN Entered By: Karie Schwalbe on 07/06/2022  15:43:31 -------------------------------------------------------------------------------- Pain Assessment Details Patient Name: Date of Service: [name]. 07/06/2022 3:00 PM Medical Record Number: [id number] Patient Account Number: [id number] Date of Birth/Sex: Treating RN: [name] (67 y.o. M) Primary Care Yaire Kreher: [name] Other Clinician: Referring Lily Velasquez: Treating [place]/Extender: [name] in Treatment: 48 Active Problems Location of Pain Severity and Description of Pain Patient Has Paino No Site Locations [name], [name] ([id number]) [id number] Page 4 of 6 Pain Management and Medication Current Pain Management: Electronic Signature(s) Signed: 07/08/2022 9:36:46 AM By: [name] Entered By: [name] on 07/06/2022 14:57:01 -------------------------------------------------------------------------------- Patient/Caregiver Education Details Patient Name: Date of Service: [name], [name] 6/26/2024andnbsp3:00 PM Medical Record Number: [id number] Patient Account Number: [id number] Date of Birth/Gender: Treating RN: [birth year] [name] (67 y.o. [name] Primary Care Physician: [name] Other Clinician: Referring Physician: Treating Physician/Extender: [name] in Treatment: [phone] Education Assessment Education Provided To: Patient Education Topics Provided Wound/Skin Impairment: Methods: Explain/Verbal Responses: Return demonstration correctly Electronic Signature(s) Signed: 07/06/2022 4:22:46 PM By: Karie Schwalbe RN Entered By: Karie Schwalbe on 07/06/2022 15:44:14 -------------------------------------------------------------------------------- Wound Assessment Details Patient Name: Date of Service: [name], [name] [name]. 07/06/2022 3:00 PM Medical Record Number: [id number] Patient Account Number: [id number] Date of Birth/Sex: Treating RN: [name] (67 y.o. M) Primary  Care Lanny Lipkin: [name] Other Clinician: Referring Maeven Mcdougall: Treating Janus Vlcek/Extender: Alric Quan [place], [name] ([id number]) [id number] Page 5 of 6 Weeks in Treatment: 48 Wound Status Wound Number: 7 Primary Venous Leg Ulcer Etiology: Wound Location: Right, Medial Malleolus Secondary Lymphedema Wounding Event: Blister Etiology: Date Acquired: 07/21/2021 Wound Open Weeks Of Treatment: 48 Status: Clustered Wound: No Comorbid Sleep Apnea, Arrhythmia, Congestive Heart Failure, History: Hypertension, Hypotension, Peripheral Arterial Disease, Peripheral Venous Disease, Type II Diabetes, Gout, Osteoarthritis Photos Wound Measurements Length: (cm) Width: (cm) Depth: (cm) Area: (cm) Volume: (cm) 4.5 % Reduction in Area: 85.2% 1.6 % Reduction in Volume: 95.8% 0.2 Epithelialization: Small (1-33%) 5.655 Tunneling: No 1.131 Undermining: No Wound Description Classification: Full Thickness With Exposed Supp Wound Margin: Distinct, outline attached Exudate Amount: Medium Exudate Type: Serosanguineous Exudate Color: red, brown ort Structures Foul Odor After Cleansing: No Slough/Fibrino Yes Wound Bed Granulation Amount: Large (67-100%) Exposed Structure Granulation Quality: Red, Pink Fascia Exposed: No Necrotic Amount: Small (1-33%) Fat Layer (Subcutaneous Tissue) Exposed: Yes Necrotic Quality: Adherent Slough Tendon Exposed: No Muscle Exposed: No Joint Exposed: No Bone Exposed: No Periwound Skin Texture Texture Color No Abnormalities Noted: No No Abnormalities Noted: No Callus: No Atrophie Blanche: No Crepitus: No Cyanosis: No Excoriation: No Ecchymosis: No Induration: No Erythema: No Rash: No Hemosiderin Staining: Yes Scarring: Yes Mottled: No Pallor: No Moisture Rubor: No No  Abnormalities Noted: No Dry / Scaly: No Maceration: No Treatment Notes Wound #7 (Malleolus) Wound Laterality: Right,  Medial Cleanser Peri-Wound Care Zinc Oxide Ointment 30g tube Discharge Instruction: Apply Zinc Oxide to periwound with each dressing change Sween Lotion (Moisturizing lotion) [name], [name] ([id number]) [id number] Page 6 of 6 Discharge Instruction: Apply moisturizing lotion as directed Topical ketoconazole and TCA cream Discharge Instruction: As needed ****APPLY TO THE IRRITATED SKIN AT UPPER PORTION OF LOWER LEG.**** Primary Dressing Endoform 2x2 in Discharge Instruction: Moisten with saline Secondary Dressing Drawtex 4x4 in Discharge Instruction: Apply over primary dressing as directed. Optifoam Non-Adhesive Dressing, 4x4 in Discharge Instruction: Apply medial and anterior lower leg with compression wrap. Woven Gauze Sponge, Non-Sterile 4x4 in Discharge Instruction: Apply over primary dressing as directed. Zetuvit Plus 4x8 in Discharge Instruction: Apply over primary dressing as directed. Secured With Compression Wrap Urgo K2, (equivalent to a 4 layer) two layer compression system, regular Discharge Instruction: Apply Urgo K2 as directed (alternative to 4 layer compression). Compression Stockings Add-Ons Electronic Signature(s) Signed: 07/06/2022 4:22:46 PM By: Karie Schwalbe RN Entered By: Karie Schwalbe on 07/06/2022 15:27:06 -------------------------------------------------------------------------------- Vitals Details Patient Name: Date of Service: [name], [name] 07/06/2022 3:00 PM Medical Record Number: [id number] Patient Account Number: [id number] Date of Birth/Sex: Treating RN: [name] (67 y.o. M) Primary Care Etter Royall: [name] Other Clinician: Referring Kashon Kraynak: Treating Laddie Math/Extender: [name] in Treatment: 48 Vital Signs Time Taken: 14:56 Temperature (F): 97.9 Height (in): 71 Pulse (bpm): 96 Weight (lbs): 350 Respiratory Rate (breaths/min): 18 Body Mass Index (BMI): 48.8 Blood Pressure  (mmHg): 127/84 Reference Range: 80 - 120 mg / dl Electronic Signature(s) Signed: 07/08/2022 9:36:46 AM By: [name] Entered By: [name] on 07/06/2022 14:56:49

## 2022-07-12 NOTE — Progress Notes (Signed)
Remote ICD transmission.   

## 2022-07-13 ENCOUNTER — Encounter (HOSPITAL_BASED_OUTPATIENT_CLINIC_OR_DEPARTMENT_OTHER): Payer: Medicare Other | Attending: Physician Assistant | Admitting: Physician Assistant

## 2022-07-13 DIAGNOSIS — I87333 Chronic venous hypertension (idiopathic) with ulcer and inflammation of bilateral lower extremity: Secondary | ICD-10-CM | POA: Diagnosis not present

## 2022-07-13 DIAGNOSIS — I509 Heart failure, unspecified: Secondary | ICD-10-CM | POA: Insufficient documentation

## 2022-07-13 DIAGNOSIS — Z950 Presence of cardiac pacemaker: Secondary | ICD-10-CM | POA: Diagnosis not present

## 2022-07-13 DIAGNOSIS — I110 Hypertensive heart disease with heart failure: Secondary | ICD-10-CM | POA: Diagnosis not present

## 2022-07-13 DIAGNOSIS — Z7901 Long term (current) use of anticoagulants: Secondary | ICD-10-CM | POA: Diagnosis not present

## 2022-07-13 DIAGNOSIS — I872 Venous insufficiency (chronic) (peripheral): Secondary | ICD-10-CM | POA: Diagnosis not present

## 2022-07-13 DIAGNOSIS — E1151 Type 2 diabetes mellitus with diabetic peripheral angiopathy without gangrene: Secondary | ICD-10-CM | POA: Insufficient documentation

## 2022-07-13 DIAGNOSIS — M109 Gout, unspecified: Secondary | ICD-10-CM | POA: Diagnosis not present

## 2022-07-13 DIAGNOSIS — M1990 Unspecified osteoarthritis, unspecified site: Secondary | ICD-10-CM | POA: Diagnosis not present

## 2022-07-13 DIAGNOSIS — I890 Lymphedema, not elsewhere classified: Secondary | ICD-10-CM | POA: Insufficient documentation

## 2022-07-13 DIAGNOSIS — L97828 Non-pressure chronic ulcer of other part of left lower leg with other specified severity: Secondary | ICD-10-CM | POA: Diagnosis not present

## 2022-07-13 DIAGNOSIS — E11621 Type 2 diabetes mellitus with foot ulcer: Secondary | ICD-10-CM | POA: Insufficient documentation

## 2022-07-13 DIAGNOSIS — L97818 Non-pressure chronic ulcer of other part of right lower leg with other specified severity: Secondary | ICD-10-CM | POA: Insufficient documentation

## 2022-07-13 DIAGNOSIS — Z6841 Body Mass Index (BMI) 40.0 and over, adult: Secondary | ICD-10-CM | POA: Diagnosis not present

## 2022-07-13 DIAGNOSIS — I2510 Atherosclerotic heart disease of native coronary artery without angina pectoris: Secondary | ICD-10-CM | POA: Diagnosis not present

## 2022-07-13 DIAGNOSIS — G4730 Sleep apnea, unspecified: Secondary | ICD-10-CM | POA: Insufficient documentation

## 2022-07-13 DIAGNOSIS — L97518 Non-pressure chronic ulcer of other part of right foot with other specified severity: Secondary | ICD-10-CM | POA: Insufficient documentation

## 2022-07-13 DIAGNOSIS — L97312 Non-pressure chronic ulcer of right ankle with fat layer exposed: Secondary | ICD-10-CM | POA: Diagnosis not present

## 2022-07-13 NOTE — Progress Notes (Signed)
[name] [name] ([id number]) [id number] Page 1 of 6 Visit Report for 07/13/2022 Arrival Information Details Patient Name: Date of Service: [name] [name] [name] [name] 07/13/2022 3:00 PM Medical Record Number: [id number] Patient Account Number: [id number] Date of Birth/Sex: Treating RN: July 26, 1955 (67 y.o. M) Primary Care Kiandra Sanguinetti: [name] Other Clinician: Referring Ingeborg Fite: Treating Parul Porcelli/Extender: Jamse Mead in Treatment: 49 Visit Information History Since Last Visit Added or deleted any medications: No Patient Arrived: [name] Any new allergies or adverse reactions: No Arrival Time: 14:45 Had a fall or experienced change in No Accompanied By: self activities of daily living that may affect Transfer Assistance: None risk of falls: Patient Identification Verified: Yes Signs or symptoms of abuse/neglect since last visito No Secondary Verification Process Completed: Yes Hospitalized since last visit: No Patient Requires Transmission-Based Precautions: No Implantable device outside of the clinic excluding No Patient Has Alerts: Yes cellular tissue based products placed in the center Patient Alerts: Patient on Blood Thinner since last visit: 01/2021 ABI L 1.13 R 1.06 Has Dressing in Place as Prescribed: Yes 01/2021 TBI L amp R 0.8 Has Compression in Place as Prescribed: Yes Pain Present Now: No Electronic Signature(s) Signed: 07/13/2022 4:52:44 PM By: Thayer Dallas Entered By: Thayer Dallas on 07/13/2022 14:47:58 -------------------------------------------------------------------------------- Compression Therapy Details Patient Name: Date of Service: [name], [name] Manuela Neptune [name]. 07/13/2022 3:00 PM Medical Record Number: [id number] Patient Account Number: [id number] Date of Birth/Sex: Treating RN: [birth year] (67 y.o. [name] Primary Care Alante Weimann: [name] Other Clinician: Referring Valerie Cones: Treating Derika Eckles/Extender: Jamse Mead in Treatment: 49 Compression Therapy Performed for Wound Assessment: Wound #7 Right,Medial Malleolus Performed By: Clinician Shawn Stall, RN Compression Type: Double Layer Post Procedure Diagnosis Same as Pre-procedure Electronic Signature(s) Signed: 07/13/2022 4:48:32 PM By: Shawn Stall RN, BSN Entered By: Shawn Stall on 07/13/2022 15:43:12 [name], Rumi [name] ([id number]) [id number] Page 2 of 6 -------------------------------------------------------------------------------- Encounter Discharge Information Details Patient Name: Date of Service: [name] [name] 07/13/2022 3:00 PM Medical Record Number: [id number] Patient Account Number: [id number] Date of Birth/Sex: Treating RN: 26-Dec-1955 (67 y.o. [name] Primary Care Slate Debroux: [name] Other Clinician: Referring Avalyn Molino: Treating Romie Tay/Extender: Jamse Mead in Treatment: 25 Encounter Discharge Information Items Post Procedure Vitals Discharge Condition: Stable Temperature (F): 98.5 Ambulatory Status: Walker Pulse (bpm): 91 Discharge Destination: Home Respiratory Rate (breaths/min): 20 Transportation: Other Blood Pressure (mmHg): 133/84 Accompanied By: self Schedule Follow-up Appointment: Yes Clinical Summary of Care: Electronic Signature(s) Signed: 07/13/2022 4:48:32 PM By: Shawn Stall RN, BSN Entered By: Shawn Stall on 07/13/2022 15:45:21 -------------------------------------------------------------------------------- Lower Extremity Assessment Details Patient Name: Date of Service: [name] [name] [name] [name]. 07/13/2022 3:00 PM Medical Record Number: [id number] Patient Account Number: [id number] Date of Birth/Sex: Treating RN: 07-12-1955 (67 y.o. M) Primary Care Stephanieann Popescu: [name] Other Clinician: Referring Nigel Wessman: Treating Sula Fetterly/Extender: Jamse Mead in Treatment: 49 Edema Assessment Assessed: Kyra Searles:  No] Franne Forts: No] Edema: [Left: N] [Right: o] Calf Left: Right: Point of Measurement: 31 cm From Medial Instep 38.5 cm Ankle Left: Right: Point of Measurement: 11 cm From Medial Instep 26 cm Electronic Signature(s) Signed: 07/13/2022 4:52:44 PM By: Thayer Dallas Entered By: Thayer Dallas on 07/13/2022 14:59:20 -------------------------------------------------------------------------------- Multi-Disciplinary Care Plan Details Patient Name: Date of Service: [name] [name] [name] [name] [name]. 07/13/2022 3:00 PM Medical Record Number: [id number] Patient Account Number: [id number] Date of Birth/Sex: Treating RN: 05-May-1955 (67 y.o. [name] Primary Care Kwali Wrinkle: [name] Other Clinician: Referring Sigurd Pugh: Treating Lora Glomski/Extender: Jamse Mead  in Treatment: 143 [name] [name] ([id number]) [id number] Page 3 of 6 Multidisciplinary Care Plan reviewed with physician Active Inactive Pain, Acute or Chronic Nursing Diagnoses: Pain, acute or chronic: actual or potential Potential alteration in comfort, pain Goals: Patient will verbalize adequate pain control and receive pain control interventions during procedures as needed Date Initiated: 08/04/2021 Target Resolution Date: 09/10/2022 Goal Status: Active Patient/caregiver will verbalize comfort level met Date Initiated: 08/04/2021 Target Resolution Date: 09/10/2022 Goal Status: Active Interventions: Encourage patient to take pain medications as prescribed Provide education on pain management Reposition patient for comfort Treatment Activities: Administer pain control measures as ordered : 08/04/2021 Notes: Electronic Signature(s) Signed: 07/13/2022 4:48:32 PM By: Shawn Stall RN, BSN Entered By: Shawn Stall on 07/13/2022 15:44:13 -------------------------------------------------------------------------------- Pain Assessment Details Patient Name: Date of Service: [name] [name] [name] [name] [name]. 07/13/2022  3:00 PM Medical Record Number: [id number] Patient Account Number: [id number] Date of Birth/Sex: Treating RN: [birth year] (67 y.o. M) Primary Care Rahel Carlton: [name] Other Clinician: Referring Najeh Credit: Treating Martin Smeal/Extender: Jamse Mead in Treatment: 49 Active Problems Location of Pain Severity and Description of Pain Patient Has Paino No Site Locations Pain Management and Medication [name] [name] [name] [name] ([id number]) [phone].pdf Page 4 of 6 Current Pain Management: Electronic Signature(s) Signed: 07/13/2022 4:52:44 PM By: Thayer Dallas Entered By: Thayer Dallas on 07/13/2022 14:48:10 -------------------------------------------------------------------------------- Wound Assessment Details Patient Name: Date of Service: [name] [name] [name] [name] [name]. 07/13/2022 3:00 PM Medical Record Number: [id number] Patient Account Number: [id number] Date of Birth/Sex: Treating RN: 14-Feb-1955 (67 y.o. M) Primary Care Linnet Bottari: [name] Other Clinician: Referring Lauraine Crespo: Treating Charistopher Rumble/Extender: Jamse Mead in Treatment: 49 Wound Status Wound Number: 7 Primary Venous Leg Ulcer Etiology: Wound Location: Right, Medial Malleolus Secondary Lymphedema Wounding Event: Blister Etiology: Date Acquired: 07/21/2021 Wound Open Weeks Of Treatment: 49 Status: Clustered Wound: No Comorbid Sleep Apnea, Arrhythmia, Congestive Heart Failure, History: Hypertension, Hypotension, Peripheral Arterial Disease, Peripheral Venous Disease, Type II Diabetes, Gout, Osteoarthritis Photos Wound Measurements Length: (cm) 4.2 Width: (cm) 1.7 Depth: (cm) 0.2 Area: (cm) 5.608 Volume: (cm) 1.122 % Reduction in Area: 85.4% % Reduction in Volume: 95.8% Epithelialization: Small (1-33%) Wound Description Classification: Full Thickness With Exposed Suppo Wound Margin: Distinct, outline attached Exudate Amount: Medium Exudate Type:  Serosanguineous Exudate Color: red, brown rt Structures Foul Odor After Cleansing: No Slough/Fibrino Yes Wound Bed Granulation Amount: Large (67-100%) Exposed Structure Granulation Quality: Red, Pink Fascia Exposed: No Necrotic Amount: Small (1-33%) Fat Layer (Subcutaneous Tissue) Exposed: Yes Tendon Exposed: No Muscle Exposed: No Joint Exposed: No Bone Exposed: No Periwound Skin Texture Texture Color [name] [name] [name] [name] ([id number]) [id number] Page 5 of 6 No Abnormalities Noted: No No Abnormalities Noted: No Callus: No Atrophie Blanche: No Crepitus: No Cyanosis: No Excoriation: No Ecchymosis: No Induration: No Erythema: No Rash: No Hemosiderin Staining: Yes Scarring: Yes Mottled: No Pallor: No Moisture Rubor: No No Abnormalities Noted: No Dry / Scaly: No Maceration: Yes Treatment Notes Wound #7 (Malleolus) Wound Laterality: Right, Medial Cleanser Peri-Wound Care Zinc Oxide Ointment 30g tube Discharge Instruction: Apply Zinc Oxide to periwound with each dressing change Sween Lotion (Moisturizing lotion) Discharge Instruction: Apply moisturizing lotion as directed Topical ketoconazole and TCA cream Discharge Instruction: As needed ****APPLY TO THE IRRITATED SKIN AT UPPER PORTION OF LOWER LEG.**** Primary Dressing Endoform 2x2 in Discharge Instruction: Moisten with saline Secondary Dressing Drawtex 4x4 in Discharge Instruction: Apply over primary dressing as directed. Optifoam Non-Adhesive Dressing, 4x4 in Discharge Instruction: Apply medial and anterior lower leg with compression wrap. Woven Gauze Sponge,  Non-Sterile 4x4 in Discharge Instruction: Apply over primary dressing as directed. Zetuvit Plus 4x8 in Discharge Instruction: Apply over primary dressing as directed. Secured With Compression Wrap Urgo K2, (equivalent to a 4 layer) two layer compression system, regular Discharge Instruction: Apply Urgo K2 as directed (alternative to 4  layer compression). Compression Stockings Add-Ons Electronic Signature(s) Signed: 07/13/2022 4:52:44 PM By: Thayer Dallas Entered By: Thayer Dallas on 07/13/2022 15:13:02 -------------------------------------------------------------------------------- Vitals Details Patient Name: Date of Service: [name] [name] [name] [name] [name]. 07/13/2022 3:00 PM Medical Record Number: [id number] Patient Account Number: [id number] Date of Birth/Sex: Treating RN: August 15, 1955 (67 y.o. M) Primary Care Georgi Tuel: [name] Other Clinician: Referring Debie Ashline: Treating Shawana Knoch/Extender: Jamse Mead in Treatment: [address] [name] [name] [name] [name] ([id number]) [id number] Page 6 of 6 Time Taken: 14:50 Temperature (F): 98.5 Height (in): 71 Pulse (bpm): 91 Weight (lbs): 350 Respiratory Rate (breaths/min): 18 Body Mass Index (BMI): 48.8 Blood Pressure (mmHg): 133/84 Reference Range: 80 - 120 mg / dl Electronic Signature(s) Signed: 07/13/2022 4:52:44 PM By: Thayer Dallas Entered By: Thayer Dallas on 07/13/2022 14:50:49

## 2022-07-13 NOTE — Progress Notes (Signed)
OLI, NAKAMA ([id number]) [id number] Page 1 of 12 Visit Report for 07/13/2022 Chief Complaint Document Details Patient Name: Date of Service: [name], [name] 07/13/2022 3:00 PM Medical Record Number: [id number] Patient Account Number: [id number] Date of Birth/Sex: Treating RN: Mar 25, 1955 (67 y.o. M) Primary Care Provider: [name] Other Clinician: Referring Provider: Treating Provider/Extender: Alver Fisher in Treatment: 78 Information Obtained from: Patient Chief Complaint 08/04/2021; patient returns to clinic with bilateral leg wounds as well as areas on the right foot Electronic Signature(s) Signed: 07/13/2022 4:25:30 PM By: Allen Derry PA-C Previous Signature: 07/13/2022 3:03:27 PM Version By: Allen Derry PA-C Entered By: Allen Derry on 07/13/2022 16:25:30 -------------------------------------------------------------------------------- Debridement Details Patient Name: Date of Service: [name], [name] 07/13/2022 3:00 PM Medical Record Number: [id number] Patient Account Number: [id number] Date of Birth/Sex: Treating RN: April 19, 1955 (67 y.o. M) Primary Care Provider: [name] Other Clinician: Referring Provider: Treating Provider/Extender: Alver Fisher in Treatment: 49 Debridement Performed for Assessment: Wound #7 Right,Medial Malleolus Performed By: Physician Lenda Kelp, PA Debridement Type: Debridement Severity of Tissue Pre Debridement: Fat layer exposed Level of Consciousness (Pre-procedure): Awake and Alert Pre-procedure Verification/Time Out Yes - 15:35 Taken: Start Time: 15:36 Pain Control: Lidocaine 4% T opical Solution Percent of Wound Bed Debrided: 100% T Area Debrided (cm): otal 5.6 Tissue and other material debrided: Viable, Non-Viable, Slough, Subcutaneous, Skin: Dermis , Skin: Epidermis, Biofilm, Slough Level: Skin/Subcutaneous Tissue Debridement Description:  Excisional Instrument: Curette Bleeding: Moderate Hemostasis Achieved: Silver Nitrate End Time: 15:42 Procedural Pain: 0 Post Procedural Pain: 0 Response to Treatment: Procedure was tolerated well Level of Consciousness (Post- Awake and Alert procedure): Post Debridement Measurements of Total Wound Length: (cm) 4.2 Width: (cm) 1.7 Depth: (cm) 0.2 Volume: (cm) 1.122 [name], [name] ([id number]) [id number] Page 2 of 12 Character of Wound/Ulcer Post Debridement: Improved Severity of Tissue Post Debridement: Fat layer exposed Post Procedure Diagnosis Same as Pre-procedure Electronic Signature(s) Signed: 07/13/2022 4:25:18 PM By: Allen Derry PA-C Entered By: Allen Derry on 07/13/2022 16:25:18 -------------------------------------------------------------------------------- HPI Details Patient Name: Date of Service: [name], [name] 07/13/2022 3:00 PM Medical Record Number: [id number] Patient Account Number: [id number] Date of Birth/Sex: Treating RN: Dec 27, 1955 (67 y.o. M) Primary Care Provider: [name] Other Clinician: Referring Provider: Treating Provider/Extender: Alver Fisher in Treatment: 27 History of Present Illness HPI Description: ADMISSION 03/22/2021 This is a 67 year old man with a past medical history significant for diabetes type 2, congestive heart failure, peripheral arterial disease, morbid obesity, venous insufficiency, and coronary artery disease. He has been followed by Dr. Loreta Ave in podiatry, who performed a transmetatarsal amputation on the left foot in August 2022. He had issues healing that wound, but based upon Dr. Kenna Gilbert notes, ultimately the TMA wound healed. During his recovery from that surgery, however, ulcers opened up over the DIP joint of the right second and third toe. These have apparently closed and reopened multiple times. It sounds like one of the issues has been moisture accumulation and  maceration of the tissues causing them to reopen. At his last visit with Dr. Loreta Ave, on March 01, 2021, there continues to be problems with moisture and he was referred to wound care for further evaluation and management. He had a formal aortogram with runoff performed prior to his TMA. The findings are copied here: Patient has inline flow to both feet with no significant flow-limiting lesion that would be amenable to percutaneous or open revascularization. He does have an element  of small vessel disease and has a short segment occlusion of the distal anterior tibial/dorsalis pedis artery on the left foot but does have posterior tibial artery flow. Would recommend management of wounds with amputation of toes 2 and 3 on the right foot if the wounds do not heal and deteriorate. Transmetatarsal amputation on the left side has as good a blood supply as it is going to get and hopefully this will heal in the future. Formal ABIs were done in January 2023. They are normal bilaterally. ABI Findings: +---------+------------------+-----+----------+--------+ Right Rt Pressure (mmHg)IndexWaveform Comment  +---------+------------------+-----+----------+--------+ Brachial 160     +---------+------------------+-----+----------+--------+ PTA 192 1.06 monophasic  +---------+------------------+-----+----------+--------+ DP 159 0.88 monophasic  +---------+------------------+-----+----------+--------+ Great T oe145 0.80    +---------+------------------+-----+----------+--------+ +--------+------------------+-----+---------+-------+ Left Lt Pressure (mmHg)IndexWaveform Comment +--------+------------------+-----+---------+-------+ [id number]     +--------+------------------+-----+---------+-------+ PTA 204 1.13 triphasic  +--------+------------------+-----+---------+-------+ DP 194 1.07 biphasic    +--------+------------------+-----+---------+-------+ +-------+-----------+-----------+------------+------------+ ABI/TBIT oday's ABIT oday's TBIPrevious ABIPrevious TBI +-------+-----------+-----------+------------+------------+ Right 1.06 0.8 1.26 0.65  +-------+-----------+-----------+------------+------------+ Left 1.13 amputation 1.15 amputation  +-------+-----------+-----------+------------+------------+ Previous ABI on 08/06/20 at [facility] Pedal pressures falsely elevated due to medial calcification. EUDELL, ANGEVINE ([id number]) [id number] Page 3 of 12 Summary: Right: Resting right ankle-brachial index is within normal range. The right toe-brachial index is normal. Left: Resting left ankle-brachial index is within normal range. READMISSION 08/04/2021 Mr. Hayenga is now a 67 year old man who I remember from this clinic many years ago I think he had a right lower extremity predominantly venous wound at the time. He was here for 1 visit in March of this year had wounds on his right second and third toes we apparently dressed them many and they healed so he did not come back. He is listed in [place] is being a diabetic although the patient denies this says he is verified it with his primary doctor. In any case over the last several weeks or so according the patient although these wounds look somewhat more chronic than that he has developed predominantly large wounds on the right medial and right lateral ankle smaller areas on the left leg and areas on the dorsal aspect of his right second and third toes. Its not clear how he has been dressing these. 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 The patient has known PAD. He had an angiogram in August 2022 at which time he had nonhealing wounds in both feet. On the left his major vessels in the thigh were all patent. He  had three-vessel patent to the level of the ankle. He had a very short occlusion in the left anterior tibial. On the right lower extremity the major vessels in his thigh were all patent. He had three-vessel runoff to the foot sluggish filling of the anterior tibial artery. He was felt to have some component of small vessel disease but nothing that was amenable or needed revascularization. It was recommended that he have amputation of the second and third toes on the right foot if they did not heal He has been following with Dr. Loreta Ave of podiatry. Dr. Loreta Ave got him juxta lite stockings although I do not think he had them on properly he has uncontrolled edema in both legs Past medical history includes type 2 diabetes [although the patient really denies this], left TMA in 2022,lower extremity wounds in fact attendance at this clinic in 2009-2010, A-fib on Eliquis, chronic venous insufficiency with secondary lymphedema history of non-Hodgkin's lymphoma. 08-11-2021 upon  evaluation today patient presents for follow-up evaluation he was seen last Wednesday initially for inspection here in our clinic. With that being said he tells me that he unfortunately has been having a lot of drainage and is actually coming through his wrap. Fortunately I do not see any evidence of active infection locally or systemically at this time which is great news. No fevers, chills, nausea, vomiting, or diarrhea. With that being said there does appear to be some evidence of local infection based on what I am seeing today. 08-18-2021 upon evaluation today patient appears to be doing okay currently in regard to his wounds with that being said that he is doing much better but still has a long ways to go to get to where he wanted to be. I think the infection is significantly improved. He has another week of the antibiotic at this point. 08-25-2021 upon evaluation today patient's wounds are actually doing decently well he has erythema has  significantly improved. I think the cellulitis is under controlled I am going to continue him on 2 more weeks of the Levaquin at 500 mg this is a lower dose but I am hoping it will be better for him. 09-08-2021 upon evaluation today patient appears to be doing excellent in regard to his wounds. Since I last saw him he was actually in the hospital where he ended up having a pacemaker put in. Subsequently he tells me that he is actually doing quite well although they were unsure whether they were going to do it due to the fact that he had the wounds on his legs. And then I am glad they did anything seem to be doing well. 09-15-2021 upon evaluation patient's wounds are actually showing signs of improvement. The right side wounds do appear to have some need for sharp debridement today and I Ernie Hew go ahead and proceed with that. I think that if we get the wounds cleaned up he will actually show signs of continued improvement. I am also leaning towards switching to [facility] which I think will be a much better option for him. 09-22-2021 patient's wounds are showing signs of excellent improvement. I am actually extremely pleased with where we stand and I think that the patient is making great progress. There does not appear to be any signs of active infection. 09-29-2021 upon evaluation today patient appears to be doing excellent in regard to his wounds. He is actually tolerating the dressing changes without complication. Fortunately I see no evidence of active infection locally or systemically at this time which is great news and overall I am extremely pleased with where we stand currently. [id number] upon evaluation today patient actually appears to be doing excellent in regard to his wounds. The left leg is almost completely healed the right ankle is significantly smaller. Overall I am extremely pleased with where we stand at this point. I do not see any evidence of active infection at this time. 10-13-2021  upon evaluation today patient appears to be doing excellent in regard to his wounds. I really feel like he is making good progress here and I am very pleased in that regard. Fortunately I do not see any signs of active infection at this time. We are using the [facility] topical antibiotic therapy. 10-20-2021 upon evaluation today patient appears to be doing well with regard to his wound on the right medial ankle region the left leg is almost completely healed. I am actually very pleased with where we stand today. 11-03-2021 upon  evaluation today patient's wound actually is going require some sharp debridement but appears to be doing much better which is great news. Fortunately I do not see any signs of active infection at this time. 11-10-2021 upon evaluation today patient's wound is actually showing signs of excellent improvement. Fortunately I do not see any evidence of infection locally or systemically which is great news and overall I am extremely pleased with where we stand today. I do believe he is making good progress he does have his [facility] topical antibiotics with him here today. 11-24-2021 upon evaluation today patient actually showing signs of excellent improvement this appears to be doing much better. Fortunately I do not see any evidence of infection locally or systemically at this time. 12-01-2021 upon evaluation today patient appears to be doing well currently in regard to his wound. He has been tolerating the dressing changes without complication. Fortunately I do not see any evidence of active infection at this time which is great news and overall I am extremely pleased with where we stand today. 12-15-2021 upon evaluation today patient appears to be doing well with regard to his wound. He is showing signs of improvement is slow but nonetheless we are making improvements here. Fortunately I do not see any evidence of active infection locally nor systemically at this time. We are still  using the [facility] topical antibiotic over the open area only. 12-29-2021 upon evaluation today patient's wound actually is showing signs of excellent improvement. It has been 2 weeks since I perform any debridement and it definitely shows he has some tissue that needs to be cleaned away but I think we can do so quite easily and readily today. The good news is I do not see any signs of infection I think he is doing much better in that regard. Overall I am extremely pleased with where we stand. 01-12-2022 upon evaluation today patient appears to be doing well currently in regard to his wound although it is not getting significantly smaller it is also not getting any larger. Fortunately I do not see any evidence of infection locally nor systemically which is great news and overall I am extremely pleased with where things stand currently. 01-19-2022 upon evaluation today patient appears to be doing well currently in regard to his wound. The PolyMem actually seems to have done extremely well DEMETRIOUS, SKENANDORE ([id number]) [id number] Page 4 of 12 for him. Fortunately I do not see any signs of infection locally nor systemically at this time. 01-26-2022 upon evaluation today patient appears to be making progress. Fortunately there does not appear to be any signs of infection which is great news. No fevers, chills, nausea, vomiting, or diarrhea. With that being said this is very slow to heal and although it is smaller I still feel like we may want to try to do something to speed this up I think that a skin substitute could be beneficial, look into Kerecis. 02-02-2022 upon evaluation today patient's wound is actually showing signs of excellent improvement. I do not see any evidence of infection and overall I think that we are headed in the right direction. Fortunately I think that he is tolerating the dressing changes without complication. 02-09-2022 upon evaluation today patient appears to  be doing well currently in regard to his wound. It does look a little bit macerated but fortunately does not appear to be showing any signs of significant skin breakdown over the macerated area which is good news. Fortunately I do not see any  evidence of active infection locally nor systemically at this point which is great news. 02-23-2022 upon evaluation today patient appears to be doing well currently in regard to his wounds. In fact his area on the ankle is actually showing signs of healing quite nicely. We do have the Apligraf ready today I am hopeful this is going to speed things up and get this closed much more effectively and quickly. Fortunately I do not see any signs of active infection locally nor systemically at this time which is great news. 03-02-2022 upon evaluation today patient appears to be doing excellent in regard to his wound. He is actually been tolerating the dressing changes without complication the wound on the left medial lower extremity is doing quite well and Apligraf seems to have been extremely beneficial for him. I am extremely pleased with where we stand today. 03/16/2022: The wound measurements are smaller today. He has accumulation of a yellow crust around the edges and slough on the surface. There is a musty odor coming from the wound. He has been getting Apligraf. 03-30-2022 upon evaluation today patient appears to be making progress. In regard to his leg ulcer. This is slow but nonetheless the Apligraf has fed things up. Fortunately I do not see any evidence of infection locally nor systemically at this time. No fevers, chills, nausea, vomiting, or diarrhea. Patient is here for Apligraf #4 today. 04-13-2022 upon evaluation today patient appears to be doing a little bit more poorly in regard to his wound. He actually feels like his wrap may have been a little bit tight. Fortunately there does not appear to be any signs of active infection locally nor systemically which is  great news and I am pleased in that regard. 04-20-2022 upon evaluation today patient appears to be doing better in regard to his wound from the standpoint of swelling he is actually lost 33 pounds on torsemide in the past week his leg is significantly smaller compared to what it has been. Fortunately I do not see any signs of active infection locally nor systemically at this time. 04-27-2022 upon evaluation today patient's wound actually is showing signs of erythema and warmth around the edges of the wound I am actually concerned about the possibility of infection. I think we probably need to obtain a wound culture and also can recommend based on what we are seeing that we go ahead and have the patient continue to utilize the compression wrapping which I think has been of benefit. 05-04-2022 upon evaluation today patient appears to be doing well currently in regard to his wound. He has been tolerating the dressing changes without complication. Fortunately there does not appear to be any signs of active infection locally or systemically which is great news. 05-11-2022 upon evaluation today patient actually appears to be doing excellent. He has been tolerating the dressing changes we actually were trialing a new medication on him last week and this was the ergo clean Ag dressing. With that being said this actually seems to have done extremely well at this point. Fortunately I do not see any signs of active infection locally or systemically at this time. 05-18-2022 upon evaluation today patient appears to be doing okay in regard to his wound I do not see any signs of worsening also notes any signs of significant improvement. Fortunately I do believe that removing in the right direction in general. 05-25-22 upon evaluation today patient appears to be doing well currently in regard to his wound. He has been  tolerating the dressing changes without complication. Fortunately I do not see any signs of active infection  locally nor systemically which is great news and overall I am extremely pleased with where we stand currently. There does not appear to be any signs of active infection locally nor systemically which is great news. No fevers, chills, nausea, vomiting, or diarrhea. 06-01-2022 upon evaluation today patient actually appears to be making excellent progress in regard to his ankle region. I am very pleased with where we stand I think that he is doing extremely well and very pleased with endoform and drawtex combination. 06-08-2022 upon evaluation today patient appears to be doing well currently in regard to his wound. He has been making good progress and I feel like practicing improvement in the overall size of the wound. I am very pleased in that regard. Fortunately I do not see any signs of active infection locally nor systemically which is great news. No fevers, chills, nausea, vomiting, or diarrhea. 06-15-2022 upon evaluation today patient appears to be doing well currently in regard to his wound. He has been tolerating the dressing changes without complication. I do believe the endoform has been extremely helpful for him up to this point. I do not see any signs of active infection locally nor systemically which is great news and in general I do believe that we are making pretty good progress here which is excellent news. 06-22-2022 upon evaluation today patient appears to be doing well currently in regard to his wound. He has been tolerating the dressing changes without complication. Fortunately there does not appear to be any signs of active infection at this time which is great news. I think that he is actually taking good progress with the endoform which is excellent as well. 06-29-2022 upon evaluation patient appears to be doing well currently in regard to his wound. He is actually making good progress towards closure and I am very pleased in that regard. I do not see any evidence of active infection  locally nor systemically which is great news and in general I do believe that we are making excellent progress here overall. 07-06-2022 upon evaluation today patient appears to be doing well currently in regard to his wound. He has been tolerating the dressing changes without complication. The good news is he is slowly getting smaller each time I see him. 07-13-2022 upon evaluation today patient appears to be doing well currently in regard to his wound is slowly showing signs of improvement. Fortunately I do not see any signs of infection at this time. No fevers, chills, nausea, vomiting, or diarrhea. Electronic Signature(s) Signed: 07/13/2022 4:25:40 PM By: Allen Derry PA-C Previous Signature: 07/13/2022 3:55:23 PM Version By: Allen Derry PA-C Entered By: Allen Derry on 07/13/2022 16:25:39 Marschall, Orvan [name] ([id number]) [id number] Page 5 of 12 -------------------------------------------------------------------------------- Physical Exam Details Patient Name: Date of Service: TAEVEON, VARLAND 07/13/2022 3:00 PM Medical Record Number: [id number] Patient Account Number: [id number] Date of Birth/Sex: Treating RN: [birth year] (67 y.o. M) Primary Care Provider: [name] Other Clinician: Referring Provider: Treating Provider/Extender: Alver Fisher in Treatment: 24 Constitutional Well-nourished and well-hydrated in no acute distress. Respiratory normal breathing without difficulty. Psychiatric this patient is able to make decisions and demonstrates good insight into disease process. Alert and Oriented x 3. pleasant and cooperative. Notes Upon inspection patient's wound bed actually showed signs of good granulation epithelization at this point. Fortunately does not appear to be any evidence of infection locally or systemically which  is great news and overall I am extremely pleased with where we stand currently. I did perform debridement  and postdebridement the wound bed appears to be doing much better he did have some bleeding I used silver nitrate for hemostasis. Electronic Signature(s) Signed: 07/13/2022 4:26:20 PM By: Allen Derry PA-C Previous Signature: 07/13/2022 3:55:46 PM Version By: Allen Derry PA-C Entered By: Allen Derry on 07/13/2022 16:26:20 -------------------------------------------------------------------------------- Physician Orders Details Patient Name: Date of Service: Freer, [name] 07/13/2022 3:00 PM Medical Record Number: [id number] Patient Account Number: [id number] Date of Birth/Sex: Treating RN: 1955-06-14 (67 y.o. Harlon Flor, Millard.Loa Primary Care Provider: [name] Other Clinician: Referring Provider: Treating Provider/Extender: Alver Fisher in Treatment: 48 Verbal / Phone Orders: No Diagnosis Coding ICD-10 Coding Code Description I89.0 Lymphedema, not elsewhere classified I87.333 Chronic venous hypertension (idiopathic) with ulcer and inflammation of bilateral lower extremity L97.828 Non-pressure chronic ulcer of other part of left lower leg with other specified severity L97.818 Non-pressure chronic ulcer of other part of right lower leg with other specified severity E11.621 Type 2 diabetes mellitus with foot ulcer L97.518 Non-pressure chronic ulcer of other part of right foot with other specified severity Follow-up Appointments ppointment in 1 week. [facility] room 8 Return A ppointment in 2 weeks. [facility] Room 8 Return A Return appointment in 3 weeks. [facility] Room 8 Anesthetic (In clinic) Topical Lidocaine 5% applied to wound bed - Used in Clinic Prior to debridement Cellular or Tissue Based Products Cellular or Tissue Based Product Type: - Run IVR for Kerrecis- denied #1 Apligraf applied 02/23/2022 [name], [name] ([id number]) [id number] Page 6 of 12 #2 Apligraf applied 03/02/2022 #3 Apligraf applied 03/16/2022 #4 Apligraf applied  03/30/2022 HOLD APLIGRAF this week. Cellular or Tissue Based Product applied to wound bed, secured with steri-strips, cover with Adaptic or Mepitel. (DO NOT REMOVE). Bathing/ Shower/ Hygiene May shower with protection but do not get wound dressing(s) wet. Protect dressing(s) with water repellant cover (for example, large plastic bag) or a cast cover and may then take shower. Edema Control - Lymphedema / SCD / Other Elevate legs to the level of the heart or above for 30 minutes daily and/or when sitting for 3-4 times a day throughout the day. Avoid standing for long periods of time. Exercise regularly Moisturize legs daily. - apply every night before bed to left leg. Compression stocking or Garment 30-40 mm/Hg pressure to: - wear your VIVE compression stocking to left leg. apply in the morning and remove at night. Wound Treatment Wound #7 - Malleolus Wound Laterality: Right, Medial Peri-Wound Care: Zinc Oxide Ointment 30g tube 1 x Per Week/30 Days Discharge Instructions: Apply Zinc Oxide to periwound with each dressing change Peri-Wound Care: Sween Lotion (Moisturizing lotion) 1 x Per Week/30 Days Discharge Instructions: Apply moisturizing lotion as directed Topical: ketoconazole and TCA cream 1 x Per Week/30 Days Discharge Instructions: As needed ****APPLY TO THE IRRITATED SKIN AT UPPER PORTION OF LOWER LEG.**** Prim Dressing: Endoform 2x2 in 1 x Per Week/30 Days ary Discharge Instructions: Moisten with saline Secondary Dressing: Drawtex 4x4 in 1 x Per Week/30 Days Discharge Instructions: Apply over primary dressing as directed. Secondary Dressing: Optifoam Non-Adhesive Dressing, 4x4 in 1 x Per Week/30 Days Discharge Instructions: Apply medial and anterior lower leg with compression wrap. Secondary Dressing: Woven Gauze Sponge, Non-Sterile 4x4 in 1 x Per Week/30 Days Discharge Instructions: Apply over primary dressing as directed. Secondary Dressing: Zetuvit Plus 4x8 in 1 x Per Week/30  Days Discharge Instructions:  Apply over primary dressing as directed. Compression Wrap: Urgo K2, (equivalent to a 4 layer) two layer compression system, regular 1 x Per Week/30 Days Discharge Instructions: Apply Urgo K2 as directed (alternative to 4 layer compression). Electronic Signature(s) Signed: 07/13/2022 4:28:53 PM By: Allen Derry PA-C Entered By: Allen Derry on 07/13/2022 16:26:29 -------------------------------------------------------------------------------- Problem List Details Patient Name: Date of Service: [name], [name] 07/13/2022 3:00 PM Medical Record Number: [id number] Patient Account Number: [id number] Date of Birth/Sex: Treating RN: 1955-08-15 (67 y.o. M) Primary Care Provider: [name] Other Clinician: Referring Provider: Treating Provider/Extender: Alver Fisher in Treatment: 32 Active Problems ICD-10 Encounter Code Description Active Date MDM Diagnosis I89.0 Lymphedema, not elsewhere classified 08/04/2021 No Yes VERNELL, GATES [name] ([id number]) [phone].pdf Page 7 of 12 [phone] Chronic venous hypertension (idiopathic) with ulcer and inflammation of 08/04/2021 No Yes bilateral lower extremity L97.828 Non-pressure chronic ulcer of other part of left lower leg with other specified 08/04/2021 No Yes severity L97.818 Non-pressure chronic ulcer of other part of right lower leg with other specified 08/04/2021 No Yes severity E11.621 Type 2 diabetes mellitus with foot ulcer 08/04/2021 No Yes L97.518 Non-pressure chronic ulcer of other part of right foot with other specified 08/04/2021 No Yes severity Inactive Problems Resolved Problems Electronic Signature(s) Signed: 07/13/2022 4:24:38 PM By: Allen Derry PA-C Previous Signature: 07/13/2022 3:03:17 PM Version By: Allen Derry PA-C Entered By: Allen Derry on 07/13/2022 16:24:38 -------------------------------------------------------------------------------- Progress Note  Details Patient Name: Date of Service: [name], [name] 07/13/2022 3:00 PM Medical Record Number: [id number] Patient Account Number: [id number] Date of Birth/Sex: Treating RN: 1955-03-27 (67 y.o. M) Primary Care Provider: [name] Other Clinician: Referring Provider: Treating Provider/Extender: Alver Fisher in Treatment: 70 Subjective Chief Complaint Information obtained from Patient 08/04/2021; patient returns to clinic with bilateral leg wounds as well as areas on the right foot History of Present Illness (HPI) ADMISSION 03/22/2021 This is a 67 year old man with a past medical history significant for diabetes type 2, congestive heart failure, peripheral arterial disease, morbid obesity, venous insufficiency, and coronary artery disease. He has been followed by Dr. Loreta Ave in podiatry, who performed a transmetatarsal amputation on the left foot in August 2022. He had issues healing that wound, but based upon Dr. Kenna Gilbert notes, ultimately the TMA wound healed. During his recovery from that surgery, however, ulcers opened up over the DIP joint of the right second and third toe. These have apparently closed and reopened multiple times. It sounds like one of the issues has been moisture accumulation and maceration of the tissues causing them to reopen. At his last visit with Dr. Loreta Ave, on March 01, 2021, there continues to be problems with moisture and he was referred to wound care for further evaluation and management. He had a formal aortogram with runoff performed prior to his TMA. The findings are copied here: Patient has inline flow to both feet with no significant flow-limiting lesion that would be amenable to percutaneous or open revascularization. He does have an element of small vessel disease and has a short segment occlusion of the distal anterior tibial/dorsalis pedis artery on the left foot but does have posterior tibial artery flow. Would recommend  management of wounds with amputation of toes 2 and 3 on the right foot if the wounds do not heal and deteriorate. Transmetatarsal amputation on the left side has as good a blood supply as it is going to get and hopefully this will heal in the future. Formal ABIs  were done in January 2023. They are normal bilaterally. ABI Findings: +---------+------------------+-----+----------+--------+ [name], [name] ([id number]) [id number] Page 8 of 12 Right Rt Pressure (mmHg)IndexWaveform Comment  +---------+------------------+-----+----------+--------+ Brachial 160    +---------+------------------+-----+----------+--------+ PTA 192 1.06 monophasic  +---------+------------------+-----+----------+--------+ DP 159 0.88 monophasic  +---------+------------------+-----+----------+--------+ Great T oe145 0.80    +---------+------------------+-----+----------+--------+ +--------+------------------+-----+---------+-------+ Left Lt Pressure (mmHg)IndexWaveform Comment +--------+------------------+-----+---------+-------+ [id number]    +--------+------------------+-----+---------+-------+ PTA 204 1.13 triphasic  +--------+------------------+-----+---------+-------+ DP 194 1.07 biphasic   +--------+------------------+-----+---------+-------+ +-------+-----------+-----------+------------+------------+ ABI/TBIT oday's ABIT oday's TBIPrevious ABIPrevious TBI +-------+-----------+-----------+------------+------------+ Right 1.06 0.8 1.26 0.65  +-------+-----------+-----------+------------+------------+ Left 1.13 amputation 1.15 amputation  +-------+-----------+-----------+------------+------------+ Previous ABI on 08/06/20 at [facility] Pedal pressures falsely elevated due to medial calcification. Summary: Right: Resting right ankle-brachial index is within normal range. The right toe-brachial index is  normal. Left: Resting left ankle-brachial index is within normal range. READMISSION 08/04/2021 Mr. Zipprich is now a 67 year old man who I remember from this clinic many years ago I think he had a right lower extremity predominantly venous wound at the time. He was here for 1 visit in March of this year had wounds on his right second and third toes we apparently dressed them many and they healed so he did not come back. He is listed in [place] is being a diabetic although the patient denies this says he is verified it with his primary doctor. In any case over the last several weeks or so according the patient although these wounds look somewhat more chronic than that he has developed predominantly large wounds on the right medial and right lateral ankle smaller areas on the left leg and areas on the dorsal aspect of his right second and third toes. Its not clear how he has been dressing these. 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 The patient has known PAD. He had an angiogram in August 2022 at which time he had nonhealing wounds in both feet. On the left his major vessels in the thigh were all patent. He had three-vessel patent to the level of the ankle. He had a very short occlusion in the left anterior tibial. On the right lower extremity the major vessels in his thigh were all patent. He had three-vessel runoff to the foot sluggish filling of the anterior tibial artery. He was felt to have some component of small vessel disease but nothing that was amenable or needed revascularization. It was recommended that he have amputation of the second and third toes on the right foot if they did not heal He has been following with Dr. Loreta Ave of podiatry. Dr. Loreta Ave got him juxta lite stockings although I do not think he had them on properly he has uncontrolled edema in both legs Past medical history includes type 2 diabetes  [although the patient really denies this], left TMA in 2022,lower extremity wounds in fact attendance at this clinic in 2009-2010, A-fib on Eliquis, chronic venous insufficiency with secondary lymphedema history of non-Hodgkin's lymphoma. 08-11-2021 upon evaluation today patient presents for follow-up evaluation he was seen last Wednesday initially for inspection here in our clinic. With that being said he tells me that he unfortunately has been having a lot of drainage and is actually coming through his wrap. Fortunately I do not see any evidence of active infection locally or systemically at this time which is great news. No fevers, chills, nausea, vomiting, or diarrhea. With that being said there does appear to be some evidence of  local infection based on what I am seeing today. 08-18-2021 upon evaluation today patient appears to be doing okay currently in regard to his wounds with that being said that he is doing much better but still has a long ways to go to get to where he wanted to be. I think the infection is significantly improved. He has another week of the antibiotic at this point. 08-25-2021 upon evaluation today patient's wounds are actually doing decently well he has erythema has significantly improved. I think the cellulitis is under controlled I am going to continue him on 2 more weeks of the Levaquin at 500 mg this is a lower dose but I am hoping it will be better for him. 09-08-2021 upon evaluation today patient appears to be doing excellent in regard to his wounds. Since I last saw him he was actually in the hospital where he ended up having a pacemaker put in. Subsequently he tells me that he is actually doing quite well although they were unsure whether they were going to do it due to the fact that he had the wounds on his legs. And then I am glad they did anything seem to be doing well. 09-15-2021 upon evaluation patient's wounds are actually showing signs of improvement. The right side  wounds do appear to have some need for sharp debridement today and I Ernie Hew go ahead and proceed with that. I think that if we get the wounds cleaned up he will actually show signs of continued improvement. I am also leaning towards switching to [facility] which I think will be a much better option for him. 09-22-2021 patient's wounds are showing signs of excellent improvement. I am actually extremely pleased with where we stand and I think that the patient is making great progress. There does not appear to be any signs of active infection. 09-29-2021 upon evaluation today patient appears to be doing excellent in regard to his wounds. He is actually tolerating the dressing changes without complication. Fortunately I see no evidence of active infection locally or systemically at this time which is great news and overall I am extremely pleased with where we stand currently. [id number] upon evaluation today patient actually appears to be doing excellent in regard to his wounds. The left leg is almost completely healed the right ankle is [name], [name] ([id number]) [id number] Page 9 of 12 significantly smaller. Overall I am extremely pleased with where we stand at this point. I do not see any evidence of active infection at this time. 10-13-2021 upon evaluation today patient appears to be doing excellent in regard to his wounds. I really feel like he is making good progress here and I am very pleased in that regard. Fortunately I do not see any signs of active infection at this time. We are using the [facility] topical antibiotic therapy. 10-20-2021 upon evaluation today patient appears to be doing well with regard to his wound on the right medial ankle region the left leg is almost completely healed. I am actually very pleased with where we stand today. 11-03-2021 upon evaluation today patient's wound actually is going require some sharp debridement but appears to be doing much  better which is great news. Fortunately I do not see any signs of active infection at this time. 11-10-2021 upon evaluation today patient's wound is actually showing signs of excellent improvement. Fortunately I do not see any evidence of infection locally or systemically which is great news and overall I am extremely pleased with where we  stand today. I do believe he is making good progress he does have his [facility] topical antibiotics with him here today. 11-24-2021 upon evaluation today patient actually showing signs of excellent improvement this appears to be doing much better. Fortunately I do not see any evidence of infection locally or systemically at this time. 12-01-2021 upon evaluation today patient appears to be doing well currently in regard to his wound. He has been tolerating the dressing changes without complication. Fortunately I do not see any evidence of active infection at this time which is great news and overall I am extremely pleased with where we stand today. 12-15-2021 upon evaluation today patient appears to be doing well with regard to his wound. He is showing signs of improvement is slow but nonetheless we are making improvements here. Fortunately I do not see any evidence of active infection locally nor systemically at this time. We are still using the [facility] topical antibiotic over the open area only. 12-29-2021 upon evaluation today patient's wound actually is showing signs of excellent improvement. It has been 2 weeks since I perform any debridement and it definitely shows he has some tissue that needs to be cleaned away but I think we can do so quite easily and readily today. The good news is I do not see any signs of infection I think he is doing much better in that regard. Overall I am extremely pleased with where we stand. 01-12-2022 upon evaluation today patient appears to be doing well currently in regard to his wound although it is not getting significantly smaller  it is also not getting any larger. Fortunately I do not see any evidence of infection locally nor systemically which is great news and overall I am extremely pleased with where things stand currently. 01-19-2022 upon evaluation today patient appears to be doing well currently in regard to his wound. The PolyMem actually seems to have done extremely well for him. Fortunately I do not see any signs of infection locally nor systemically at this time. 01-26-2022 upon evaluation today patient appears to be making progress. Fortunately there does not appear to be any signs of infection which is great news. No fevers, chills, nausea, vomiting, or diarrhea. With that being said this is very slow to heal and although it is smaller I still feel like we may want to try to do something to speed this up I think that a skin substitute could be beneficial, look into Kerecis. 02-02-2022 upon evaluation today patient's wound is actually showing signs of excellent improvement. I do not see any evidence of infection and overall I think that we are headed in the right direction. Fortunately I think that he is tolerating the dressing changes without complication. 02-09-2022 upon evaluation today patient appears to be doing well currently in regard to his wound. It does look a little bit macerated but fortunately does not appear to be showing any signs of significant skin breakdown over the macerated area which is good news. Fortunately I do not see any evidence of active infection locally nor systemically at this point which is great news. 02-23-2022 upon evaluation today patient appears to be doing well currently in regard to his wounds. In fact his area on the ankle is actually showing signs of healing quite nicely. We do have the Apligraf ready today I am hopeful this is going to speed things up and get this closed much more effectively and quickly. Fortunately I do not see any signs of active infection locally nor  systemically at this time which is great news. 03-02-2022 upon evaluation today patient appears to be doing excellent in regard to his wound. He is actually been tolerating the dressing changes without complication the wound on the left medial lower extremity is doing quite well and Apligraf seems to have been extremely beneficial for him. I am extremely pleased with where we stand today. 03/16/2022: The wound measurements are smaller today. He has accumulation of a yellow crust around the edges and slough on the surface. There is a musty odor coming from the wound. He has been getting Apligraf. 03-30-2022 upon evaluation today patient appears to be making progress. In regard to his leg ulcer. This is slow but nonetheless the Apligraf has fed things up. Fortunately I do not see any evidence of infection locally nor systemically at this time. No fevers, chills, nausea, vomiting, or diarrhea. Patient is here for Apligraf #4 today. 04-13-2022 upon evaluation today patient appears to be doing a little bit more poorly in regard to his wound. He actually feels like his wrap may have been a little bit tight. Fortunately there does not appear to be any signs of active infection locally nor systemically which is great news and I am pleased in that regard. 04-20-2022 upon evaluation today patient appears to be doing better in regard to his wound from the standpoint of swelling he is actually lost 33 pounds on torsemide in the past week his leg is significantly smaller compared to what it has been. Fortunately I do not see any signs of active infection locally nor systemically at this time. 04-27-2022 upon evaluation today patient's wound actually is showing signs of erythema and warmth around the edges of the wound I am actually concerned about the possibility of infection. I think we probably need to obtain a wound culture and also can recommend based on what we are seeing that we go ahead and have the patient  continue to utilize the compression wrapping which I think has been of benefit. 05-04-2022 upon evaluation today patient appears to be doing well currently in regard to his wound. He has been tolerating the dressing changes without complication. Fortunately there does not appear to be any signs of active infection locally or systemically which is great news. 05-11-2022 upon evaluation today patient actually appears to be doing excellent. He has been tolerating the dressing changes we actually were trialing a new medication on him last week and this was the ergo clean Ag dressing. With that being said this actually seems to have done extremely well at this point. Fortunately I do not see any signs of active infection locally or systemically at this time. 05-18-2022 upon evaluation today patient appears to be doing okay in regard to his wound I do not see any signs of worsening also notes any signs of significant improvement. Fortunately I do believe that removing in the right direction in general. 05-25-22 upon evaluation today patient appears to be doing well currently in regard to his wound. He has been tolerating the dressing changes without complication. Fortunately I do not see any signs of active infection locally nor systemically which is great news and overall I am extremely pleased with where we stand currently. There does not appear to be any signs of active infection locally nor systemically which is great news. No fevers, chills, nausea, vomiting, or diarrhea. 06-01-2022 upon evaluation today patient actually appears to be making excellent progress in regard to his ankle region. I am very pleased with where  we stand I think that he is doing extremely well and very pleased with endoform and drawtex combination. [name], [name] ([id number]) [id number] Page 10 of 12 06-08-2022 upon evaluation today patient appears to be doing well currently in regard to his wound. He has been  making good progress and I feel like practicing improvement in the overall size of the wound. I am very pleased in that regard. Fortunately I do not see any signs of active infection locally nor systemically which is great news. No fevers, chills, nausea, vomiting, or diarrhea. 06-15-2022 upon evaluation today patient appears to be doing well currently in regard to his wound. He has been tolerating the dressing changes without complication. I do believe the endoform has been extremely helpful for him up to this point. I do not see any signs of active infection locally nor systemically which is great news and in general I do believe that we are making pretty good progress here which is excellent news. 06-22-2022 upon evaluation today patient appears to be doing well currently in regard to his wound. He has been tolerating the dressing changes without complication. Fortunately there does not appear to be any signs of active infection at this time which is great news. I think that he is actually taking good progress with the endoform which is excellent as well. 06-29-2022 upon evaluation patient appears to be doing well currently in regard to his wound. He is actually making good progress towards closure and I am very pleased in that regard. I do not see any evidence of active infection locally nor systemically which is great news and in general I do believe that we are making excellent progress here overall. 07-06-2022 upon evaluation today patient appears to be doing well currently in regard to his wound. He has been tolerating the dressing changes without complication. The good news is he is slowly getting smaller each time I see him. 07-13-2022 upon evaluation today patient appears to be doing well currently in regard to his wound is slowly showing signs of improvement. Fortunately I do not see any signs of infection at this time. No fevers, chills, nausea, vomiting, or  diarrhea. Objective Constitutional Well-nourished and well-hydrated in no acute distress. Vitals Time Taken: 2:50 PM, Height: 71 in, Weight: 350 lbs, BMI: 48.8, Temperature: 98.5 F, Pulse: 91 bpm, Respiratory Rate: 18 breaths/min, Blood Pressure: 133/84 mmHg. Respiratory normal breathing without difficulty. Psychiatric this patient is able to make decisions and demonstrates good insight into disease process. Alert and Oriented x 3. pleasant and cooperative. General Notes: Upon inspection patient's wound bed actually showed signs of good granulation epithelization at this point. Fortunately does not appear to be any evidence of infection locally or systemically which is great news and overall I am extremely pleased with where we stand currently. I did perform debridement and postdebridement the wound bed appears to be doing much better he did have some bleeding I used silver nitrate for hemostasis. Integumentary (Hair, Skin) Wound #7 status is Open. Original cause of wound was Blister. The date acquired was: 07/21/2021. The wound has been in treatment 49 weeks. The wound is located on the Right,Medial Malleolus. The wound measures 4.2cm length x 1.7cm width x 0.2cm depth; 5.608cm^2 area and 1.122cm^3 volume. There is Fat Layer (Subcutaneous Tissue) exposed. There is a medium amount of serosanguineous drainage noted. The wound margin is distinct with the outline attached to the wound base. There is large (67-100%) red, pink granulation within the wound bed. There is a  small (1-33%) amount of necrotic tissue within the wound bed. The periwound skin appearance exhibited: Scarring, Maceration, Hemosiderin Staining. The periwound skin appearance did not exhibit: Callus, Crepitus, Excoriation, Induration, Rash, Dry/Scaly, Atrophie Blanche, Cyanosis, Ecchymosis, Mottled, Pallor, Rubor, Erythema. 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 There was a Excisional Skin/Subcutaneous Tissue Debridement with a total area of 5.6 sq cm performed by Lenda Kelp, PA. With the following instrument(s): Curette to remove Viable and Non-Viable tissue/material. Material removed includes Subcutaneous Tissue, Slough, Skin: Dermis, Skin: Epidermis, and Biofilm after achieving pain control using Lidocaine 4% Topical Solution. A time out was conducted at 15:35, prior to the start of the procedure. A Moderate amount of bleeding was controlled with Silver Nitrate. The procedure was tolerated well with a pain level of 0 throughout and a pain level of 0 following the procedure. Post Debridement Measurements: 4.2cm length x 1.7cm width x 0.2cm depth; 1.122cm^3 volume. Character of Wound/Ulcer Post Debridement is improved. Severity of Tissue Post Debridement is: Fat layer exposed. [name], [name] ([id number]) [id number] Page 11 of 12 Post procedure Diagnosis Wound #7: Same as Pre-Procedure Pre-procedure diagnosis of Wound #7 is a Venous Leg Ulcer located on the Right,Medial Malleolus . There was a Double Layer Compression Therapy Procedure by Shawn Stall, RN. Post procedure Diagnosis Wound #7: Same as Pre-Procedure Plan Follow-up Appointments: Return Appointment in 1 week. [facility] 8 Return Appointment in 2 weeks. [facility] 8 Return appointment in 3 weeks. [facility]  8 Anesthetic: (In clinic) Topical Lidocaine 5% applied to wound bed - Used in Clinic Prior to debridement Cellular or Tissue Based Products: Cellular or Tissue Based Product Type: - Run IVR for Kerrecis- denied #1 Apligraf applied 02/23/2022 #2 Apligraf applied 03/02/2022 #3 Apligraf applied 03/16/2022 #4 Apligraf applied 03/30/2022 HOLD APLIGRAF this week. Cellular or Tissue Based Product applied to wound bed, secured with steri-strips, cover with Adaptic or Mepitel. (DO NOT REMOVE). Bathing/ Shower/ Hygiene: May shower with protection but do not get wound dressing(s) wet. Protect dressing(s) with water repellant cover (for example, large plastic bag) or a cast cover and may then take shower. Edema Control - Lymphedema / SCD / Other: Elevate legs to the level of the heart or above for 30 minutes daily and/or when sitting for 3-4 times a day throughout the day. Avoid standing for long periods of time. Exercise regularly Moisturize legs daily. - apply every night before bed to left leg. Compression stocking or Garment 30-40 mm/Hg pressure to: - wear your VIVE compression stocking to left leg. apply in the morning and remove at night. WOUND #7: - Malleolus Wound Laterality: Right, Medial Peri-Wound Care: Zinc Oxide Ointment 30g tube 1 x Per Week/30 Days Discharge Instructions: Apply Zinc Oxide to periwound with each dressing change Peri-Wound Care: Sween Lotion (Moisturizing lotion) 1 x Per Week/30 Days Discharge Instructions: Apply moisturizing lotion as directed Topical: ketoconazole and TCA cream 1 x Per Week/30 Days Discharge Instructions: As needed ****APPLY TO THE  IRRITATED SKIN AT UPPER PORTION OF LOWER LEG.**** Prim Dressing: Endoform 2x2 in 1 x Per Week/30 Days ary Discharge Instructions: Moisten with saline Secondary Dressing: Drawtex 4x4 in 1 x Per Week/30 Days Discharge Instructions: Apply over primary dressing as directed. Secondary Dressing: Optifoam Non-Adhesive Dressing, 4x4 in 1  x Per Week/30 Days Discharge Instructions: Apply medial and anterior lower leg with compression wrap. Secondary Dressing: Woven Gauze Sponge, Non-Sterile 4x4 in 1 x Per Week/30 Days Discharge Instructions: Apply over primary dressing as directed. Secondary Dressing: Zetuvit Plus 4x8 in 1 x Per Week/30 Days Discharge Instructions: Apply over primary dressing as directed. Com pression Wrap: Urgo K2, (equivalent to a 4 layer) two layer compression system, regular 1 x Per Week/30 Days Discharge Instructions: Apply Urgo K2 as directed (alternative to 4 layer compression). 1. I would recommend that we have the patient continue to monitor for any signs of infection or worsening. Based on what I am seeing I do believe that he is making good headway towards complete closure. Wanting to continue with endoform for now. 2. I am also can recommend the patient should continue to monitor for any signs of infection or worsening. Based on what I am seeing I do think that we are making progress I am extremely pleased at this point. We will see patient back for reevaluation in 1 week here in the clinic. If anything worsens or changes patient will contact our office for additional recommendations. Electronic Signature(s) Signed: 07/13/2022 4:26:57 PM By: Allen Derry PA-C Previous Signature: 07/13/2022 3:56:17 PM Version By: Allen Derry PA-C Entered By: Allen Derry on 07/13/2022 16:26:56 -------------------------------------------------------------------------------- SuperBill Details Patient Name: Date of Service: [name], [name] 07/13/2022 Medical Record Number: [id number] Patient Account Number: [id number] Date of Birth/Sex: Treating RN: May 26, 1955 (67 y.o. [name] Primary Care Provider: [name] Other Clinician: Referring Provider: Treating Provider/Extender: Alver Fisher in Treatment: [address], [place] [name] ([id number]) [phone].pdf Page 12 of  12 Diagnosis Coding ICD-10 Codes Code Description I89.0 Lymphedema, not elsewhere classified I87.333 Chronic venous hypertension (idiopathic) with ulcer and inflammation of bilateral lower extremity L97.828 Non-pressure chronic ulcer of other part of left lower leg with other specified severity L97.818 Non-pressure chronic ulcer of other part of right lower leg with other specified severity E11.621 Type 2 diabetes mellitus with foot ulcer L97.518 Non-pressure chronic ulcer of other part of right foot with other specified severity Facility Procedures : 3 CPT4 Code: [id number] Description: 11042 - DEB SUBQ TISSUE 20 SQ CM/< ICD-10 Diagnosis Description L97.818 Non-pressure chronic ulcer of other part of right lower leg with other specified Modifier: severity Quantity: 1 Physician Procedures : CPT4 Code Description Modifier [id number] 11042 - WC PHYS SUBQ TISS 20 SQ CM ICD-10 Diagnosis Description L97.818 Non-pressure chronic ulcer of other part of right lower leg with other specified severity Quantity: 1 Electronic Signature(s) Signed: 07/13/2022 4:27:12 PM By: Allen Derry PA-C Previous Signature: 07/13/2022 3:56:27 PM Version By: Allen Derry PA-C Entered By: Allen Derry on 07/13/2022 16:27:11

## 2022-07-15 ENCOUNTER — Other Ambulatory Visit (HOSPITAL_COMMUNITY): Payer: Self-pay

## 2022-07-18 DIAGNOSIS — K080 Exfoliation of teeth due to systemic causes: Secondary | ICD-10-CM | POA: Diagnosis not present

## 2022-07-20 ENCOUNTER — Encounter (HOSPITAL_BASED_OUTPATIENT_CLINIC_OR_DEPARTMENT_OTHER): Payer: Medicare Other | Admitting: Physician Assistant

## 2022-07-20 DIAGNOSIS — E1151 Type 2 diabetes mellitus with diabetic peripheral angiopathy without gangrene: Secondary | ICD-10-CM | POA: Diagnosis not present

## 2022-07-20 DIAGNOSIS — Z7901 Long term (current) use of anticoagulants: Secondary | ICD-10-CM | POA: Diagnosis not present

## 2022-07-20 DIAGNOSIS — L97518 Non-pressure chronic ulcer of other part of right foot with other specified severity: Secondary | ICD-10-CM | POA: Diagnosis not present

## 2022-07-20 DIAGNOSIS — I509 Heart failure, unspecified: Secondary | ICD-10-CM | POA: Diagnosis not present

## 2022-07-20 DIAGNOSIS — I2510 Atherosclerotic heart disease of native coronary artery without angina pectoris: Secondary | ICD-10-CM | POA: Diagnosis not present

## 2022-07-20 DIAGNOSIS — Z950 Presence of cardiac pacemaker: Secondary | ICD-10-CM | POA: Diagnosis not present

## 2022-07-20 DIAGNOSIS — E11621 Type 2 diabetes mellitus with foot ulcer: Secondary | ICD-10-CM | POA: Diagnosis not present

## 2022-07-20 DIAGNOSIS — L97828 Non-pressure chronic ulcer of other part of left lower leg with other specified severity: Secondary | ICD-10-CM | POA: Diagnosis not present

## 2022-07-20 DIAGNOSIS — I110 Hypertensive heart disease with heart failure: Secondary | ICD-10-CM | POA: Diagnosis not present

## 2022-07-20 DIAGNOSIS — I890 Lymphedema, not elsewhere classified: Secondary | ICD-10-CM | POA: Diagnosis not present

## 2022-07-20 DIAGNOSIS — M1990 Unspecified osteoarthritis, unspecified site: Secondary | ICD-10-CM | POA: Diagnosis not present

## 2022-07-20 DIAGNOSIS — G4730 Sleep apnea, unspecified: Secondary | ICD-10-CM | POA: Diagnosis not present

## 2022-07-20 DIAGNOSIS — M109 Gout, unspecified: Secondary | ICD-10-CM | POA: Diagnosis not present

## 2022-07-20 DIAGNOSIS — I872 Venous insufficiency (chronic) (peripheral): Secondary | ICD-10-CM | POA: Diagnosis not present

## 2022-07-20 DIAGNOSIS — I87333 Chronic venous hypertension (idiopathic) with ulcer and inflammation of bilateral lower extremity: Secondary | ICD-10-CM | POA: Diagnosis not present

## 2022-07-20 DIAGNOSIS — L97818 Non-pressure chronic ulcer of other part of right lower leg with other specified severity: Secondary | ICD-10-CM | POA: Diagnosis not present

## 2022-07-20 DIAGNOSIS — S90521A Blister (nonthermal), right ankle, initial encounter: Secondary | ICD-10-CM | POA: Diagnosis not present

## 2022-07-20 DIAGNOSIS — Z6841 Body Mass Index (BMI) 40.0 and over, adult: Secondary | ICD-10-CM | POA: Diagnosis not present

## 2022-07-20 NOTE — Progress Notes (Addendum)
GEN, CLAGG ([id number]) [id number] Page 1 of 11 Visit Report for 07/20/2022 Chief Complaint Document Details Patient Name: Date of Service: [name], [name] 07/20/2022 3:00 PM Medical Record Number: [id number] Patient Account Number: [id number] Date of Birth/Sex: Treating RN: [birth year] (67 y.o. M) Primary Care Provider: [name] Other Clinician: Referring Provider: Treating Provider/Extender: Alver Fisher in Treatment: 50 Information Obtained from: Patient Chief Complaint 08/04/2021; patient returns to clinic with bilateral leg wounds as well as areas on the right foot Electronic Signature(s) Signed: 07/20/2022 3:09:54 PM By: Allen Derry PA-C Entered By: Allen Derry on 07/20/2022 15:09:54 -------------------------------------------------------------------------------- HPI Details Patient Name: Date of Service: [name], [name]. 07/20/2022 3:00 PM Medical Record Number: [id number] Patient Account Number: [id number] Date of Birth/Sex: Treating RN: [name] (67 y.o. M) Primary Care Provider: [name] Other Clinician: Referring Provider: Treating Provider/Extender: Alver Fisher in Treatment: 50 History of Present Illness HPI Description: ADMISSION 03/22/2021 This is a 67 year old man with a past medical history significant for diabetes type 2, congestive heart failure, peripheral arterial disease, morbid obesity, venous insufficiency, and coronary artery disease. He has been followed by Dr. Loreta Ave in podiatry, who performed a transmetatarsal amputation on the left foot in August 2022. He had issues healing that wound, but based upon Dr. Kenna Gilbert notes, ultimately the TMA wound healed. During his recovery from that surgery, however, ulcers opened up over the DIP joint of the right second and third toe. These have apparently closed and reopened multiple times. It sounds like one of the issues has been  moisture accumulation and maceration of the tissues causing them to reopen. At his last visit with Dr. Loreta Ave, on March 01, 2021, there continues to be problems with moisture and he was referred to wound care for further evaluation and management. He had a formal aortogram with runoff performed prior to his TMA. The findings are copied here: Patient has inline flow to both feet with no significant flow-limiting lesion that would be amenable to percutaneous or open revascularization. He does have an element of small vessel disease and has a short segment occlusion of the distal anterior tibial/dorsalis pedis artery on the left foot but does have posterior tibial artery flow. Would recommend management of wounds with amputation of toes 2 and 3 on the right foot if the wounds do not heal and deteriorate. Transmetatarsal amputation on the left side has as good a blood supply as it is going to get and hopefully this will heal in the future. Formal ABIs were done in January 2023. They are normal bilaterally. ABI Findings: +---------+------------------+-----+----------+--------+ Right Rt Pressure (mmHg)IndexWaveform Comment  +---------+------------------+-----+----------+--------+ Brachial 160     +---------+------------------+-----+----------+--------+ PTA 192 1.06 monophasic  +---------+------------------+-----+----------+--------+ DP 159 0.88 monophasic  +---------+------------------+-----+----------+--------+ Great T oe145 0.80    +---------+------------------+-----+----------+--------+ +--------+------------------+-----+---------+-------+ [name] ([id number]) [id number] Page 2 of 11 Left Lt Pressure (mmHg)IndexWaveform Comment +--------+------------------+-----+---------+-------+ [id number]     +--------+------------------+-----+---------+-------+ PTA 204 1.13 triphasic   +--------+------------------+-----+---------+-------+ DP 194 1.07 biphasic   +--------+------------------+-----+---------+-------+ +-------+-----------+-----------+------------+------------+ ABI/TBIT oday's ABIT oday's TBIPrevious ABIPrevious TBI +-------+-----------+-----------+------------+------------+ Right 1.06 0.8 1.26 0.65  +-------+-----------+-----------+------------+------------+ Left 1.13 amputation 1.15 amputation  +-------+-----------+-----------+------------+------------+ Previous ABI on 08/06/20 at [facility] Pedal pressures falsely elevated due to medial calcification. Summary: Right: Resting right ankle-brachial index is within normal range. The right toe-brachial index is normal. Left: Resting left ankle-brachial index is within normal range. READMISSION 08/04/2021 [name] is now a 67 year old man who I remember from this clinic many years ago I  think he had a right lower extremity predominantly venous wound at the time. He was here for 1 visit in March of this year had wounds on his right second and third toes we apparently dressed them many and they healed so he did not come back. He is listed in [place] is being a diabetic although the patient denies this says he is verified it with his primary doctor. In any case over the last several weeks or so according the patient although these wounds look somewhat more chronic than that he has developed predominantly large wounds on the right medial and right lateral ankle smaller areas on the left leg and areas on the dorsal aspect of his right second and third toes. Its not clear how he has been dressing these. 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 The patient has known PAD. He had an angiogram in August 2022 at which time he had nonhealing wounds in both feet. On the left his major vessels in the thigh were all  patent. He had three-vessel patent to the level of the ankle. He had a very short occlusion in the left anterior tibial. On the right lower extremity the major vessels in his thigh were all patent. He had three-vessel runoff to the foot sluggish filling of the anterior tibial artery. He was felt to have some component of small vessel disease but nothing that was amenable or needed revascularization. It was recommended that he have amputation of the second and third toes on the right foot if they did not heal He has been following with Dr. Loreta Ave of podiatry. Dr. Loreta Ave got him juxta lite stockings although I do not think he had them on properly he has uncontrolled edema in both legs Past medical history includes type 2 diabetes [although the patient really denies this], left TMA in 2022,lower extremity wounds in fact attendance at this clinic in 2009-2010, A-fib on Eliquis, chronic venous insufficiency with secondary lymphedema history of non-Hodgkin's lymphoma. 08-11-2021 upon evaluation today patient presents for follow-up evaluation he was seen last Wednesday initially for inspection here in our clinic. With that being said he tells me that he unfortunately has been having a lot of drainage and is actually coming through his wrap. Fortunately I do not see any evidence of active infection locally or systemically at this time which is great news. No fevers, chills, nausea, vomiting, or diarrhea. With that being said there does appear to be some evidence of local infection based on what I am seeing today. 08-18-2021 upon evaluation today patient appears to be doing okay currently in regard to his wounds with that being said that he is doing much better but still has a long ways to go to get to where he wanted to be. I think the infection is significantly improved. He has another week of the antibiotic at this point. 08-25-2021 upon evaluation today patient's wounds are actually doing decently well he has  erythema has significantly improved. I think the cellulitis is under controlled I am going to continue him on 2 more weeks of the Levaquin at 500 mg this is a lower dose but I am hoping it will be better for him. 09-08-2021 upon evaluation today patient appears to be doing excellent in regard to his wounds. Since I last saw him he was actually in the hospital where he ended up having a pacemaker put in. Subsequently he tells  me that he is actually doing quite well although they were unsure whether they were going to do it due to the fact that he had the wounds on his legs. And then I am glad they did anything seem to be doing well. 09-15-2021 upon evaluation patient's wounds are actually showing signs of improvement. The right side wounds do appear to have some need for sharp debridement today and I Ernie Hew go ahead and proceed with that. I think that if we get the wounds cleaned up he will actually show signs of continued improvement. I am also leaning towards switching to [facility] which I think will be a much better option for him. 09-22-2021 patient's wounds are showing signs of excellent improvement. I am actually extremely pleased with where we stand and I think that the patient is making great progress. There does not appear to be any signs of active infection. 09-29-2021 upon evaluation today patient appears to be doing excellent in regard to his wounds. He is actually tolerating the dressing changes without complication. Fortunately I see no evidence of active infection locally or systemically at this time which is great news and overall I am extremely pleased with where we stand currently. [id number] upon evaluation today patient actually appears to be doing excellent in regard to his wounds. The left leg is almost completely healed the right ankle is significantly smaller. Overall I am extremely pleased with where we stand at this point. I do not see any evidence of active infection at this  time. 10-13-2021 upon evaluation today patient appears to be doing excellent in regard to his wounds. I really feel like he is making good progress here and I am very pleased in that regard. Fortunately I do not see any signs of active infection at this time. We are using the [facility] topical antibiotic therapy. 10-20-2021 upon evaluation today patient appears to be doing well with regard to his wound on the right medial ankle region the left leg is almost completely healed. I am actually very pleased with where we stand today. 11-03-2021 upon evaluation today patient's wound actually is going require some sharp debridement but appears to be doing much better which is great news. Fortunately I do not see any signs of active infection at this time. 11-10-2021 upon evaluation today patient's wound is actually showing signs of excellent improvement. Fortunately I do not see any evidence of infection locally [name], [name] ([id number]) [id number] Page 3 of 11 or systemically which is great news and overall I am extremely pleased with where we stand today. I do believe he is making good progress he does have his [facility] topical antibiotics with him here today. 11-24-2021 upon evaluation today patient actually showing signs of excellent improvement this appears to be doing much better. Fortunately I do not see any evidence of infection locally or systemically at this time. 12-01-2021 upon evaluation today patient appears to be doing well currently in regard to his wound. He has been tolerating the dressing changes without complication. Fortunately I do not see any evidence of active infection at this time which is great news and overall I am extremely pleased with where we stand today. 12-15-2021 upon evaluation today patient appears to be doing well with regard to his wound. He is showing signs of improvement is slow but nonetheless we are making improvements here. Fortunately I  do not see any evidence of active infection locally nor systemically at this time. We are still using the [facility] topical antibiotic  over the open area only. 12-29-2021 upon evaluation today patient's wound actually is showing signs of excellent improvement. It has been 2 weeks since I perform any debridement and it definitely shows he has some tissue that needs to be cleaned away but I think we can do so quite easily and readily today. The good news is I do not see any signs of infection I think he is doing much better in that regard. Overall I am extremely pleased with where we stand. 01-12-2022 upon evaluation today patient appears to be doing well currently in regard to his wound although it is not getting significantly smaller it is also not getting any larger. Fortunately I do not see any evidence of infection locally nor systemically which is great news and overall I am extremely pleased with where things stand currently. 01-19-2022 upon evaluation today patient appears to be doing well currently in regard to his wound. The PolyMem actually seems to have done extremely well for him. Fortunately I do not see any signs of infection locally nor systemically at this time. 01-26-2022 upon evaluation today patient appears to be making progress. Fortunately there does not appear to be any signs of infection which is great news. No fevers, chills, nausea, vomiting, or diarrhea. With that being said this is very slow to heal and although it is smaller I still feel like we [name] want to try to do something to speed this up I think that a skin substitute could be beneficial, look into Kerecis. 02-02-2022 upon evaluation today patient's wound is actually showing signs of excellent improvement. I do not see any evidence of infection and overall I think that we are headed in the right direction. Fortunately I think that he is tolerating the dressing changes without complication. 02-09-2022 upon evaluation today  patient appears to be doing well currently in regard to his wound. It does look a little bit macerated but fortunately does not appear to be showing any signs of significant skin breakdown over the macerated area which is good news. Fortunately I do not see any evidence of active infection locally nor systemically at this point which is great news. 02-23-2022 upon evaluation today patient appears to be doing well currently in regard to his wounds. In fact his area on the ankle is actually showing signs of healing quite nicely. We do have the Apligraf ready today I am hopeful this is going to speed things up and get this closed much more effectively and quickly. Fortunately I do not see any signs of active infection locally nor systemically at this time which is great news. 03-02-2022 upon evaluation today patient appears to be doing excellent in regard to his wound. He is actually been tolerating the dressing changes without complication the wound on the left medial lower extremity is doing quite well and Apligraf seems to have been extremely beneficial for him. I am extremely pleased with where we stand today. 03/16/2022: The wound measurements are smaller today. He has accumulation of a yellow crust around the edges and slough on the surface. There is a musty odor coming from the wound. He has been getting Apligraf. 03-30-2022 upon evaluation today patient appears to be making progress. In regard to his leg ulcer. This is slow but nonetheless the Apligraf has fed things up. Fortunately I do not see any evidence of infection locally nor systemically at this time. No fevers, chills, nausea, vomiting, or diarrhea. Patient is here for Apligraf #4 today. 04-13-2022 upon evaluation today patient  appears to be doing a little bit more poorly in regard to his wound. He actually feels like his wrap [name] have been a little bit tight. Fortunately there does not appear to be any signs of active infection locally nor  systemically which is great news and I am pleased in that regard. 04-20-2022 upon evaluation today patient appears to be doing better in regard to his wound from the standpoint of swelling he is actually lost 33 pounds on torsemide in the past week his leg is significantly smaller compared to what it has been. Fortunately I do not see any signs of active infection locally nor systemically at this time. 04-27-2022 upon evaluation today patient's wound actually is showing signs of erythema and warmth around the edges of the wound I am actually concerned about the possibility of infection. I think we probably need to obtain a wound culture and also can recommend based on what we are seeing that we go ahead and have the patient continue to utilize the compression wrapping which I think has been of benefit. 05-04-2022 upon evaluation today patient appears to be doing well currently in regard to his wound. He has been tolerating the dressing changes without complication. Fortunately there does not appear to be any signs of active infection locally or systemically which is great news. 05-11-2022 upon evaluation today patient actually appears to be doing excellent. He has been tolerating the dressing changes we actually were trialing a new medication on him last week and this was the ergo clean Ag dressing. With that being said this actually seems to have done extremely well at this point. Fortunately I do not see any signs of active infection locally or systemically at this time. 05-18-2022 upon evaluation today patient appears to be doing okay in regard to his wound I do not see any signs of worsening also notes any signs of significant improvement. Fortunately I do believe that removing in the right direction in general. 05-25-22 upon evaluation today patient appears to be doing well currently in regard to his wound. He has been tolerating the dressing changes without complication. Fortunately I do not see any  signs of active infection locally nor systemically which is great news and overall I am extremely pleased with where we stand currently. There does not appear to be any signs of active infection locally nor systemically which is great news. No fevers, chills, nausea, vomiting, or diarrhea. 06-01-2022 upon evaluation today patient actually appears to be making excellent progress in regard to his ankle region. I am very pleased with where we stand I think that he is doing extremely well and very pleased with endoform and drawtex combination. 06-08-2022 upon evaluation today patient appears to be doing well currently in regard to his wound. He has been making good progress and I feel like practicing improvement in the overall size of the wound. I am very pleased in that regard. Fortunately I do not see any signs of active infection locally nor systemically which is great news. No fevers, chills, nausea, vomiting, or diarrhea. 06-15-2022 upon evaluation today patient appears to be doing well currently in regard to his wound. He has been tolerating the dressing changes without complication. I do believe the endoform has been extremely helpful for him up to this point. I do not see any signs of active infection locally nor systemically which is great news and in general I do believe that we are making pretty good progress here which is excellent news. 06-22-2022 upon  evaluation today patient appears to be doing well currently in regard to his wound. He has been tolerating the dressing changes without complication. Fortunately there does not appear to be any signs of active infection at this time which is great news. I think that he is actually taking good progress with the endoform which is excellent as well. [name], [name] ([id number]) [id number] Page 4 of 11 06-29-2022 upon evaluation patient appears to be doing well currently in regard to his wound. He is actually making good  progress towards closure and I am very pleased in that regard. I do not see any evidence of active infection locally nor systemically which is great news and in general I do believe that we are making excellent progress here overall. 07-06-2022 upon evaluation today patient appears to be doing well currently in regard to his wound. He has been tolerating the dressing changes without complication. The good news is he is slowly getting smaller each time I see him. 07-13-2022 upon evaluation today patient appears to be doing well currently in regard to his wound is slowly showing signs of improvement. Fortunately I do not see any signs of infection at this time. No fevers, chills, nausea, vomiting, or diarrhea. 07-20-2022 upon evaluation today patient appears to be doing well currently in regard to his wound. He has been tolerating the dressing changes without complication. Fortunately I do not see any evidence of active infection locally or systemically which is great news and in general I do believe that we are making headway towards complete closure. This is excellent news. With that being said we are preparing for him to be leaving to go out of town in about a week and a half. For that reason we will going to make a few changes to his dressing today in order to give him something that I think will be easier for him to do when he is out of town. Electronic Signature(s) Signed: 07/20/2022 5:14:10 PM By: Allen Derry PA-C Entered By: Allen Derry on 07/20/2022 17:14:09 -------------------------------------------------------------------------------- Physical Exam Details Patient Name: Date of Service: Areola, [name]. 07/20/2022 3:00 PM Medical Record Number: [id number] Patient Account Number: [id number] Date of Birth/Sex: Treating RN: [birth year] (67 y.o. M) Primary Care Provider: [name] Other Clinician: Referring Provider: Treating Provider/Extender: Alver Fisher in  Treatment: 50 Constitutional Obese and well-hydrated in no acute distress. Respiratory normal breathing without difficulty. Psychiatric this patient is able to make decisions and demonstrates good insight into disease process. Alert and Oriented x 3. pleasant and cooperative. Notes Upon inspection patient's wound did not require sharp debridement and seems to be doing excellent I am very pleased in that regard. I do not see any evidence of infection at this time. Electronic Signature(s) Signed: 07/20/2022 5:15:46 PM By: Allen Derry PA-C Entered By: Allen Derry on 07/20/2022 17:15:45 -------------------------------------------------------------------------------- Physician Orders Details Patient Name: Date of Service: Laver, [name]. 07/20/2022 3:00 PM Medical Record Number: [id number] Patient Account Number: [id number] Date of Birth/Sex: Treating RN: [name] (67 y.o. [name] Primary Care Provider: [name] Other Clinician: Referring Provider: Treating Provider/Extender: Alver Fisher in Treatment: 75 Verbal / Phone Orders: No Diagnosis Coding ICD-10 Coding Code Description NYQUAN, SELBE ([id number]) [id number] Page 5 of 11 I89.0 Lymphedema, not elsewhere classified I87.333 Chronic venous hypertension (idiopathic) with ulcer and inflammation of bilateral lower extremity L97.828 Non-pressure chronic ulcer of other part of left lower leg with other specified severity L97.818 Non-pressure chronic  ulcer of other part of right lower leg with other specified severity E11.621 Type 2 diabetes mellitus with foot ulcer L97.518 Non-pressure chronic ulcer of other part of right foot with other specified severity Follow-up Appointments ppointment in 1 week. [facility] room 8 Return A Return appointment in 3 weeks. [facility] Room 8 Anesthetic (In clinic) Topical Lidocaine 5% applied to wound bed - Used in Clinic Prior to  debridement Cellular or Tissue Based Products Cellular or Tissue Based Product Type: - Run IVR for Kerrecis- denied #1 Apligraf applied 02/23/2022 #2 Apligraf applied 03/02/2022 #3 Apligraf applied 03/16/2022 #4 Apligraf applied 03/30/2022 HOLD APLIGRAF this week. Cellular or Tissue Based Product applied to wound bed, secured with steri-strips, cover with Adaptic or Mepitel. (DO NOT REMOVE). Bathing/ Shower/ Hygiene [name] shower with protection but do not get wound dressing(s) wet. Protect dressing(s) with water repellant cover (for example, large plastic bag) or a cast cover and [name] then take shower. Edema Control - Lymphedema / SCD / Other Elevate legs to the level of the heart or above for 30 minutes daily and/or when sitting for 3-4 times a day throughout the day. Avoid standing for long periods of time. Patient to wear own compression stockings every day. - apply in the morning and remove at night left leg. Exercise regularly Moisturize legs daily. - apply every night before bed. Compression stocking or Garment 30-40 mm/Hg pressure to: - wear your VIVE compression stocking to left leg. apply in the morning and remove at night. Wound Treatment Wound #7 - Malleolus Wound Laterality: Right, Medial Cleanser: Soap and Water 1 x Per Day/30 Days Discharge Instructions: [name] shower and wash wound with dial antibacterial soap and water prior to dressing change. Cleanser: Vashe 5.8 (oz) 1 x Per Day/30 Days Discharge Instructions: Cleanse the wound with Vashe prior to applying a clean dressing using gauze sponges, not tissue or cotton balls. Peri-Wound Care: Skin Prep (DME) (Generic) 1 x Per Day/30 Days Discharge Instructions: Use skin prep as directed Prim Dressing: Hydrofera Blue Ready Transfer Foam, 4x5 (in/in) (DME) (Generic) 1 x Per Day/30 Days ary Discharge Instructions: Apply to wound bed as instructed Secondary Dressing: Zetuvit Plus Silicone Border Dressing 4x4 (in/in) (DME) (Generic) 1 x  Per Day/30 Days Discharge Instructions: Apply silicone border over primary dressing as directed. Compression Wrap: Medigrip Elasticated Tubular Support Bandage, Size [name], 3 (in) (DME) (Generic) 1 x Per Day/30 Days Discharge Instructions: apply in the morning and remove at night. Electronic Signature(s) Signed: 07/20/2022 5:48:19 PM By: Allen Derry PA-C Signed: 07/20/2022 6:00:37 PM By: Shawn Stall RN, BSN Entered By: Shawn Stall on 07/20/2022 15:47:49 -------------------------------------------------------------------------------- Problem List Details Patient Name: Date of Service: Skoog, [name]. 07/20/2022 3:00 PM Medical Record Number: [id number] Patient Account Number: [id number] Date of Birth/Sex: Treating RN: [name] (67 y.o. M) Primary Care Provider: [name] Other Clinician: LEMOND, [name] ([id number]) [phone].pdf Page 6 of 11 Referring Provider: Treating Provider/Extender: Alver Fisher in Treatment: 50 Active Problems ICD-10 Encounter Code Description Active Date MDM Diagnosis I89.0 Lymphedema, not elsewhere classified 08/04/2021 No Yes I87.333 Chronic venous hypertension (idiopathic) with ulcer and inflammation of 08/04/2021 No Yes bilateral lower extremity L97.828 Non-pressure chronic ulcer of other part of left lower leg with other specified 08/04/2021 No Yes severity L97.818 Non-pressure chronic ulcer of other part of right lower leg with other specified 08/04/2021 No Yes severity E11.621 Type 2 diabetes mellitus with foot ulcer 08/04/2021 No Yes L97.518 Non-pressure chronic ulcer of other part of  right foot with other specified 08/04/2021 No Yes severity Inactive Problems Resolved Problems Electronic Signature(s) Signed: 07/20/2022 3:09:42 PM By: Allen Derry PA-C Entered By: Allen Derry on 07/20/2022 15:09:42 -------------------------------------------------------------------------------- Progress Note  Details Patient Name: Date of Service: [name], [name]. 07/20/2022 3:00 PM Medical Record Number: [id number] Patient Account Number: [id number] Date of Birth/Sex: Treating RN: [name]-[name]-[name] (67 y.o. M) Primary Care Provider: [name] Other Clinician: Referring Provider: Treating Provider/Extender: Alver Fisher in Treatment: 50 Subjective Chief Complaint Information obtained from Patient 08/04/2021; patient returns to clinic with bilateral leg wounds as well as areas on the right foot History of Present Illness (HPI) ADMISSION 03/22/2021 This is a 67 year old man with a past medical history significant for diabetes type 2, congestive heart failure, peripheral arterial disease, morbid obesity, venous insufficiency, and coronary artery disease. He has been followed by Dr. Loreta Ave in podiatry, who performed a transmetatarsal amputation on the left foot in August 2022. He had issues healing that wound, but based upon Dr. Kenna Gilbert notes, ultimately the TMA wound healed. During his recovery from that surgery, however, ulcers opened up over the DIP joint of the right second and third toe. These have apparently closed and reopened multiple times. It sounds YOSIEL, THIEME [name] ([id number]) [id number] Page 7 of 11 like one of the issues has been moisture accumulation and maceration of the tissues causing them to reopen. At his last visit with Dr. Loreta Ave, on March 01, 2021, there continues to be problems with moisture and he was referred to wound care for further evaluation and management. He had a formal aortogram with runoff performed prior to his TMA. The findings are copied here: Patient has inline flow to both feet with no significant flow-limiting lesion that would be amenable to percutaneous or open revascularization. He does have an element of small vessel disease and has a short segment occlusion of the distal anterior tibial/dorsalis pedis artery on  the left foot but does have posterior tibial artery flow. Would recommend management of wounds with amputation of toes 2 and 3 on the right foot if the wounds do not heal and deteriorate. Transmetatarsal amputation on the left side has as good a blood supply as it is going to get and hopefully this will heal in the future. Formal ABIs were done in January 2023. They are normal bilaterally. ABI Findings: +---------+------------------+-----+----------+--------+ Right Rt Pressure (mmHg)IndexWaveform Comment  +---------+------------------+-----+----------+--------+ Brachial 160    +---------+------------------+-----+----------+--------+ PTA 192 1.06 monophasic  +---------+------------------+-----+----------+--------+ DP 159 0.88 monophasic  +---------+------------------+-----+----------+--------+ Great T oe145 0.80    +---------+------------------+-----+----------+--------+ +--------+------------------+-----+---------+-------+ Left Lt Pressure (mmHg)IndexWaveform Comment +--------+------------------+-----+---------+-------+ [id number]    +--------+------------------+-----+---------+-------+ PTA 204 1.13 triphasic  +--------+------------------+-----+---------+-------+ DP 194 1.07 biphasic   +--------+------------------+-----+---------+-------+ +-------+-----------+-----------+------------+------------+ ABI/TBIT oday's ABIT oday's TBIPrevious ABIPrevious TBI +-------+-----------+-----------+------------+------------+ Right 1.06 0.8 1.26 0.65  +-------+-----------+-----------+------------+------------+ Left 1.13 amputation 1.15 amputation  +-------+-----------+-----------+------------+------------+ Previous ABI on 08/06/20 at [facility] Pedal pressures falsely elevated due to medial calcification. Summary: Right: Resting right ankle-brachial index is within normal range. The right toe-brachial index is normal. Left:  Resting left ankle-brachial index is within normal range. READMISSION 08/04/2021 Mr. Wolden is now a 67 year old man who I remember from this clinic many years ago I think he had a right lower extremity predominantly venous wound at the time. He was here for 1 visit in March of this year had wounds on his right second and third toes we apparently dressed them many and they healed so he did not come back. He is listed in [facility] health  is being a diabetic although the patient denies this says he is verified it with his primary doctor. In any case over the last several weeks or so according the patient although these wounds look somewhat more chronic than that he has developed predominantly large wounds on the right medial and right lateral ankle smaller areas on the left leg and areas on the dorsal aspect of his right second and third toes. Its not clear how he has been dressing these. 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 The patient has known PAD. He had an angiogram in August 2022 at which time he had nonhealing wounds in both feet. On the left his major vessels in the thigh were all patent. He had three-vessel patent to the level of the ankle. He had a very short occlusion in the left anterior tibial. On the right lower extremity the major vessels in his thigh were all patent. He had three-vessel runoff to the foot sluggish filling of the anterior tibial artery. He was felt to have some component of small vessel disease but nothing that was amenable or needed revascularization. It was recommended that he have amputation of the second and third toes on the right foot if they did not heal He has been following with Dr. Loreta Ave of podiatry. Dr. Loreta Ave got him juxta lite stockings although I do not think he had them on properly he has uncontrolled edema in both legs Past medical history includes type 2 diabetes [although the  patient really denies this], left TMA in 2022,lower extremity wounds in fact attendance at this clinic in 2009-2010, A-fib on Eliquis, chronic venous insufficiency with secondary lymphedema history of non-Hodgkin's lymphoma. 08-11-2021 upon evaluation today patient presents for follow-up evaluation he was seen last Wednesday initially for inspection here in our clinic. With that being said he tells me that he unfortunately has been having a lot of drainage and is actually coming through his wrap. Fortunately I do not see any evidence of active infection locally or systemically at this time which is great news. No fevers, chills, nausea, vomiting, or diarrhea. With that being said there does appear to be some evidence of local infection based on what I am seeing today. 08-18-2021 upon evaluation today patient appears to be doing okay currently in regard to his wounds with that being said that he is doing much better but still has a long ways to go to get to where he wanted to be. I think the infection is significantly improved. He has another week of the antibiotic at this point. 08-25-2021 upon evaluation today patient's wounds are actually doing decently well he has erythema has significantly improved. I think the cellulitis is under controlled I am going to continue him on 2 more weeks of the Levaquin at 500 mg this is a lower dose but I am hoping it will be better for him. 09-08-2021 upon evaluation today patient appears to be doing excellent in regard to his wounds. Since I last saw him he was actually in the hospital where he ended up having a pacemaker put in. Subsequently he tells me that he is actually doing quite well although they were unsure whether they were going to do it due to the fact that he had the wounds on his legs. And then I am glad they did anything seem to be doing well. [name], [name] ([id number]) [id number] Page 8 of 11 09-15-2021  upon evaluation patient's  wounds are actually showing signs of improvement. The right side wounds do appear to have some need for sharp debridement today and I Ernie Hew go ahead and proceed with that. I think that if we get the wounds cleaned up he will actually show signs of continued improvement. I am also leaning towards switching to [facility] which I think will be a much better option for him. 09-22-2021 patient's wounds are showing signs of excellent improvement. I am actually extremely pleased with where we stand and I think that the patient is making great progress. There does not appear to be any signs of active infection. 09-29-2021 upon evaluation today patient appears to be doing excellent in regard to his wounds. He is actually tolerating the dressing changes without complication. Fortunately I see no evidence of active infection locally or systemically at this time which is great news and overall I am extremely pleased with where we stand currently. 04-979 upon evaluation today patient actually appears to be doing excellent in regard to his wounds. The left leg is almost completely healed the right ankle is significantly smaller. Overall I am extremely pleased with where we stand at this point. I do not see any evidence of active infection at this time. 10-13-2021 upon evaluation today patient appears to be doing excellent in regard to his wounds. I really feel like he is making good progress here and I am very pleased in that regard. Fortunately I do not see any signs of active infection at this time. We are using the [facility] topical antibiotic therapy. 10-20-2021 upon evaluation today patient appears to be doing well with regard to his wound on the right medial ankle region the left leg is almost completely healed. I am actually very pleased with where we stand today. 11-03-2021 upon evaluation today patient's wound actually is going require some sharp debridement but appears to be doing much better which is  great news. Fortunately I do not see any signs of active infection at this time. 11-10-2021 upon evaluation today patient's wound is actually showing signs of excellent improvement. Fortunately I do not see any evidence of infection locally or systemically which is great news and overall I am extremely pleased with where we stand today. I do believe he is making good progress he does have his [facility] topical antibiotics with him here today. 11-24-2021 upon evaluation today patient actually showing signs of excellent improvement this appears to be doing much better. Fortunately I do not see any evidence of infection locally or systemically at this time. 12-01-2021 upon evaluation today patient appears to be doing well currently in regard to his wound. He has been tolerating the dressing changes without complication. Fortunately I do not see any evidence of active infection at this time which is great news and overall I am extremely pleased with where we stand today. 12-15-2021 upon evaluation today patient appears to be doing well with regard to his wound. He is showing signs of improvement is slow but nonetheless we are making improvements here. Fortunately I do not see any evidence of active infection locally nor systemically at this time. We are still using the [facility] topical antibiotic over the open area only. 12-29-2021 upon evaluation today patient's wound actually is showing signs of excellent improvement. It has been 2 weeks since I perform any debridement and it definitely shows he has some tissue that needs to be cleaned away but I think we can do so quite easily and readily today.  The good news is I do not see any signs of infection I think he is doing much better in that regard. Overall I am extremely pleased with where we stand. 01-12-2022 upon evaluation today patient appears to be doing well currently in regard to his wound although it is not getting significantly smaller it is also  not getting any larger. Fortunately I do not see any evidence of infection locally nor systemically which is great news and overall I am extremely pleased with where things stand currently. 01-19-2022 upon evaluation today patient appears to be doing well currently in regard to his wound. The PolyMem actually seems to have done extremely well for him. Fortunately I do not see any signs of infection locally nor systemically at this time. 01-26-2022 upon evaluation today patient appears to be making progress. Fortunately there does not appear to be any signs of infection which is great news. No fevers, chills, nausea, vomiting, or diarrhea. With that being said this is very slow to heal and although it is smaller I still feel like we [name] want to try to do something to speed this up I think that a skin substitute could be beneficial, look into Kerecis. 02-02-2022 upon evaluation today patient's wound is actually showing signs of excellent improvement. I do not see any evidence of infection and overall I think that we are headed in the right direction. Fortunately I think that he is tolerating the dressing changes without complication. 02-09-2022 upon evaluation today patient appears to be doing well currently in regard to his wound. It does look a little bit macerated but fortunately does not appear to be showing any signs of significant skin breakdown over the macerated area which is good news. Fortunately I do not see any evidence of active infection locally nor systemically at this point which is great news. 02-23-2022 upon evaluation today patient appears to be doing well currently in regard to his wounds. In fact his area on the ankle is actually showing signs of healing quite nicely. We do have the Apligraf ready today I am hopeful this is going to speed things up and get this closed much more effectively and quickly. Fortunately I do not see any signs of active infection locally nor systemically at  this time which is great news. 03-02-2022 upon evaluation today patient appears to be doing excellent in regard to his wound. He is actually been tolerating the dressing changes without complication the wound on the left medial lower extremity is doing quite well and Apligraf seems to have been extremely beneficial for him. I am extremely pleased with where we stand today. 03/16/2022: The wound measurements are smaller today. He has accumulation of a yellow crust around the edges and slough on the surface. There is a musty odor coming from the wound. He has been getting Apligraf. 03-30-2022 upon evaluation today patient appears to be making progress. In regard to his leg ulcer. This is slow but nonetheless the Apligraf has fed things up. Fortunately I do not see any evidence of infection locally nor systemically at this time. No fevers, chills, nausea, vomiting, or diarrhea. Patient is here for Apligraf #4 today. 04-13-2022 upon evaluation today patient appears to be doing a little bit more poorly in regard to his wound. He actually feels like his wrap [name] have been a little bit tight. Fortunately there does not appear to be any signs of active infection locally nor systemically which is great news and I am pleased in that regard.  04-20-2022 upon evaluation today patient appears to be doing better in regard to his wound from the standpoint of swelling he is actually lost 33 pounds on torsemide in the past week his leg is significantly smaller compared to what it has been. Fortunately I do not see any signs of active infection locally nor systemically at this time. 04-27-2022 upon evaluation today patient's wound actually is showing signs of erythema and warmth around the edges of the wound I am actually concerned about the possibility of infection. I think we probably need to obtain a wound culture and also can recommend based on what we are seeing that we go ahead and have the patient continue to utilize  the compression wrapping which I think has been of benefit. 05-04-2022 upon evaluation today patient appears to be doing well currently in regard to his wound. He has been tolerating the dressing changes without complication. Fortunately there does not appear to be any signs of active infection locally or systemically which is great news. 05-11-2022 upon evaluation today patient actually appears to be doing excellent. He has been tolerating the dressing changes we actually were trialing a new medication on him last week and this was the ergo clean Ag dressing. With that being said this actually seems to have done extremely well at this point. [name], [name] ([id number]) [id number] Page 9 of 11 Fortunately I do not see any signs of active infection locally or systemically at this time. 05-18-2022 upon evaluation today patient appears to be doing okay in regard to his wound I do not see any signs of worsening also notes any signs of significant improvement. Fortunately I do believe that removing in the right direction in general. 05-25-22 upon evaluation today patient appears to be doing well currently in regard to his wound. He has been tolerating the dressing changes without complication. Fortunately I do not see any signs of active infection locally nor systemically which is great news and overall I am extremely pleased with where we stand currently. There does not appear to be any signs of active infection locally nor systemically which is great news. No fevers, chills, nausea, vomiting, or diarrhea. 06-01-2022 upon evaluation today patient actually appears to be making excellent progress in regard to his ankle region. I am very pleased with where we stand I think that he is doing extremely well and very pleased with endoform and drawtex combination. 06-08-2022 upon evaluation today patient appears to be doing well currently in regard to his wound. He has been making good progress  and I feel like practicing improvement in the overall size of the wound. I am very pleased in that regard. Fortunately I do not see any signs of active infection locally nor systemically which is great news. No fevers, chills, nausea, vomiting, or diarrhea. 06-15-2022 upon evaluation today patient appears to be doing well currently in regard to his wound. He has been tolerating the dressing changes without complication. I do believe the endoform has been extremely helpful for him up to this point. I do not see any signs of active infection locally nor systemically which is great news and in general I do believe that we are making pretty good progress here which is excellent news. 06-22-2022 upon evaluation today patient appears to be doing well currently in regard to his wound. He has been tolerating the dressing changes without complication. Fortunately there does not appear to be any signs of active infection at this time which is great news. I think  that he is actually taking good progress with the endoform which is excellent as well. 06-29-2022 upon evaluation patient appears to be doing well currently in regard to his wound. He is actually making good progress towards closure and I am very pleased in that regard. I do not see any evidence of active infection locally nor systemically which is great news and in general I do believe that we are making excellent progress here overall. 07-06-2022 upon evaluation today patient appears to be doing well currently in regard to his wound. He has been tolerating the dressing changes without complication. The good news is he is slowly getting smaller each time I see him. 07-13-2022 upon evaluation today patient appears to be doing well currently in regard to his wound is slowly showing signs of improvement. Fortunately I do not see any signs of infection at this time. No fevers, chills, nausea, vomiting, or diarrhea. 07-20-2022 upon evaluation today patient appears  to be doing well currently in regard to his wound. He has been tolerating the dressing changes without complication. Fortunately I do not see any evidence of active infection locally or systemically which is great news and in general I do believe that we are making headway towards complete closure. This is excellent news. With that being said we are preparing for him to be leaving to go out of town in about a week and a half. For that reason we will going to make a few changes to his dressing today in order to give him something that I think will be easier for him to do when he is out of town. Objective Constitutional Obese and well-hydrated in no acute distress. Vitals Time Taken: 3:20 PM, Height: 71 in, Weight: 350 lbs, BMI: 48.8, Temperature: 98.6 F, Pulse: 103 bpm, Respiratory Rate: 18 breaths/min, Blood Pressure: 139/83 mmHg. Respiratory normal breathing without difficulty. Psychiatric this patient is able to make decisions and demonstrates good insight into disease process. Alert and Oriented x 3. pleasant and cooperative. General Notes: Upon inspection patient's wound did not require sharp debridement and seems to be doing excellent I am very pleased in that regard. I do not see any evidence of infection at this time. Integumentary (Hair, Skin) Wound #7 status is Open. Original cause of wound was Blister. The date acquired was: 07/21/2021. The wound has been in treatment 50 weeks. The wound is located on the Right,Medial Malleolus. The wound measures 4.2cm length x 1.5cm width x 0.2cm depth; 4.948cm^2 area and 0.99cm^3 volume. There is Fat Layer (Subcutaneous Tissue) exposed. There is no tunneling or undermining noted. There is a medium amount of serosanguineous drainage noted. The wound margin is thickened. There is large (67-100%) red, pink granulation within the wound bed. There is a small (1-33%) amount of necrotic tissue within the wound bed. The periwound skin appearance exhibited:  Scarring, Maceration, Hemosiderin Staining. The periwound skin appearance did not exhibit: Callus, Crepitus, Excoriation, Induration, Rash, Dry/Scaly, Atrophie Blanche, Cyanosis, Ecchymosis, Mottled, Pallor, Rubor, Erythema. 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 [facility] room 8  Return appointment in 3 weeks. [facility] Room 8 Anesthetic: (In clinic) Topical Lidocaine 5% applied to wound bed - Used in Clinic Prior to debridement Cellular or Tissue Based Products: Cellular or Tissue Based Product Type: - Run IVR for Kerrecis- denied #1 Apligraf applied 02/23/2022 #2 Apligraf applied 03/02/2022 #3 Apligraf applied 03/16/2022 #4 Apligraf applied 03/30/2022 HOLD APLIGRAF this week. Cellular or Tissue Based Product applied to wound bed, secured with steri-strips, cover with Adaptic or Mepitel. (DO NOT REMOVE). Bathing/ Shower/ Hygiene: [name] shower with protection but do not get wound dressing(s) wet. Protect dressing(s) with water repellant cover (for example, large plastic bag) or a cast cover and [name] then take shower. Edema Control - Lymphedema / SCD / Other: Elevate legs to the level of the heart or above for 30 minutes daily and/or when sitting for 3-4 times a day throughout the day. Avoid standing for long periods of time. Patient to wear own compression stockings every day. - apply in the morning and remove at night left leg. Exercise  regularly Moisturize legs daily. - apply every night before bed. Compression stocking or Garment 30-40 mm/Hg pressure to: - wear your VIVE compression stocking to left leg. apply in the morning and remove at night. WOUND #7: - Malleolus Wound Laterality: Right, Medial Cleanser: Soap and Water 1 x Per Day/30 Days Discharge Instructions: [name] shower and wash wound with dial antibacterial soap and water prior to dressing change. Cleanser: Vashe 5.8 (oz) 1 x Per Day/30 Days Discharge Instructions: Cleanse the wound with Vashe prior to applying a clean dressing using gauze sponges, not tissue or cotton balls. Peri-Wound Care: Skin Prep (DME) (Generic) 1 x Per Day/30 Days Discharge Instructions: Use skin prep as directed Prim Dressing: Hydrofera Blue Ready Transfer Foam, 4x5 (in/in) (DME) (Generic) 1 x Per Day/30 Days ary Discharge Instructions: Apply to wound bed as instructed Secondary Dressing: Zetuvit Plus Silicone Border Dressing 4x4 (in/in) (DME) (Generic) 1 x Per Day/30 Days Discharge Instructions: Apply silicone border over primary dressing as directed. Com pression Wrap: Medigrip Elasticated Tubular Support Bandage, Size [name], 3 (in) (DME) (Generic) 1 x Per Day/30 Days Discharge Instructions: apply in the morning and remove at night. 1. I would recommend that we have the patient go ahead and continue to monitor for any signs of infection or worsening. Based on what I am seeing right now I am going to go ahead and make a switch in his dressing. This is done using [facility] and when he is out of town he has been using T egaderm over top of this. With that being said in the meantime we are going to have him use a bordered foam dressing over top of the [facility] and specifically the Zetuvit. 2. I am good recommend as well that we use the Tubigrip in order to help with keeping the swelling under control this will allow you to put the bordered foam dressing on and the Tubigrip over top. 3.  Working to see how things go over the next week if he cannot tolerate the double layer Tubigrip that he will go to the single layer Tubigrip and we will go from there. He is in agreement with the plan. We will see patient back for reevaluation in 1 week here in the clinic. If anything worsens or changes patient will contact our office for additional recommendations. Electronic Signature(s) Signed: 07/20/2022 5:16:39 PM By: Allen Derry PA-C Entered By: Allen Derry on 07/20/2022 17:16:38 -------------------------------------------------------------------------------- SuperBill Details Patient Name: Date of  Service: [name], [name] 07/20/2022 Medical Record Number: [id number] Patient Account Number: [id number] Date of Birth/Sex: Treating RN: [name], [name] (67 y.o. [name] Primary Care Provider: [name] Other Clinician: Referring Provider: Treating Provider/Extender: Alver Fisher in Treatment: 50 Diagnosis Coding ICD-10 Codes Code Description I89.0 Lymphedema, not elsewhere classified JAYLON, BOYLEN ([id number]) [phone].pdf Page 11 of 11 I87.333 Chronic venous hypertension (idiopathic) with ulcer and inflammation of bilateral lower extremity L97.828 Non-pressure chronic ulcer of other part of left lower leg with other specified severity L97.818 Non-pressure chronic ulcer of other part of right lower leg with other specified severity E11.621 Type 2 diabetes mellitus with foot ulcer L97.518 Non-pressure chronic ulcer of other part of right foot with other specified severity Facility Procedures : 7 CPT4 Code: 3235573 Description: 99213 - WOUND CARE VISIT-LEV 3 EST PT Modifier: Quantity: 1 Physician Procedures : CPT4 Code Description Modifier [id number] 99213 - WC PHYS LEVEL 3 - EST PT ICD-10 Diagnosis Description I89.0 Lymphedema, not elsewhere classified I87.333 Chronic venous hypertension (idiopathic) with ulcer and inflammation of  bilateral lower extremity  L97.828 Non-pressure chronic ulcer of other part of left lower leg with other specified severity L97.818 Non-pressure chronic ulcer of other part of right lower leg with other specified severity Quantity: 1 Electronic Signature(s) Signed: 07/20/2022 5:16:49 PM By: Allen Derry PA-C Entered By: Allen Derry on 07/20/2022 17:16:49

## 2022-07-21 DIAGNOSIS — L97518 Non-pressure chronic ulcer of other part of right foot with other specified severity: Secondary | ICD-10-CM | POA: Diagnosis not present

## 2022-07-21 NOTE — Progress Notes (Signed)
ZAMIR, STAPLES ([id number]) [id number] Page 1 of 8 Visit Report for 07/20/2022 Arrival Information Details Patient Name: Date of Service: NAEL, PETROSYAN 07/20/2022 3:00 PM Medical Record Number: [id number] Patient Account Number: [id number] Date of Birth/Sex: Treating RN: 05-25-1955 (67 y.o. M) Primary Care Jarick Harkins: [name] Other Clinician: Referring Wilmarie Sparlin: Treating Griffey Nicasio/Extender: Alver Fisher in Treatment: 50 Visit Information History Since Last Visit Added or deleted any medications: No Patient Arrived: [name] Any new allergies or adverse reactions: No Arrival Time: 15:20 Had a fall or experienced change in No Accompanied By: self activities of daily living that may affect Transfer Assistance: None risk of falls: Patient Identification Verified: Yes Signs or symptoms of abuse/neglect since last visito No Secondary Verification Process Completed: Yes Hospitalized since last visit: No Patient Requires Transmission-Based Precautions: No Implantable device outside of the clinic excluding No Patient Has Alerts: Yes cellular tissue based products placed in the center Patient Alerts: Patient on Blood Thinner since last visit: 01/2021 ABI L 1.13 R 1.06 Has Dressing in Place as Prescribed: Yes 01/2021 TBI L amp R 0.8 Pain Present Now: No Electronic Signature(s) Signed: 07/20/2022 3:58:30 PM By: Karl Ito Entered By: Karl Ito on 07/20/2022 15:20:23 -------------------------------------------------------------------------------- Clinic Level of Care Assessment Details Patient Name: Date of Service: DAJUAN, TURNLEY D. 07/20/2022 3:00 PM Medical Record Number: [id number] Patient Account Number: [id number] Date of Birth/Sex: Treating RN: 04/20/1955 (67 y.o. [name] Primary Care Dartanyon Frankowski: [name] Other Clinician: Referring Jatinder Mcdonagh: Treating Patrisia Faeth/Extender: Alver Fisher in  Treatment: 50 Clinic Level of Care Assessment Items TOOL 4 Quantity Score X- 1 0 Use when only an EandM is performed on FOLLOW-UP visit ASSESSMENTS - Nursing Assessment / Reassessment X- 1 10 Reassessment of Co-morbidities (includes updates in patient status) X- 1 5 Reassessment of Adherence to Treatment Plan ASSESSMENTS - Wound and Skin A ssessment / Reassessment X - Simple Wound Assessment / Reassessment - one wound 1 5 []  - 0 Complex Wound Assessment / Reassessment - multiple wounds []  - 0 Dermatologic / Skin Assessment (not related to wound area) ASSESSMENTS - Focused Assessment X- 1 5 Circumferential Edema Measurements - multi extremities []  - 0 Nutritional Assessment / Counseling / Intervention COLSTON, PYLE D ([id number]) [id number] Page 2 of 8 []  - 0 Lower Extremity Assessment (monofilament, tuning fork, pulses) []  - 0 Peripheral Arterial Disease Assessment (using hand held doppler) ASSESSMENTS - Ostomy and/or Continence Assessment and Care []  - 0 Incontinence Assessment and Management []  - 0 Ostomy Care Assessment and Management (repouching, etc.) PROCESS - Coordination of Care X - Simple Patient / Family Education for ongoing care 1 15 []  - 0 Complex (extensive) Patient / Family Education for ongoing care X- 1 10 Staff obtains Chiropractor, Records, T Results / Process Orders est []  - 0 Staff telephones HHA, Nursing Homes / Clarify orders / etc []  - 0 Routine Transfer to another Facility (non-emergent condition) []  - 0 Routine Hospital Admission (non-emergent condition) []  - 0 New Admissions / Manufacturing engineer / Ordering NPWT Apligraf, etc. , []  - 0 Emergency Hospital Admission (emergent condition) X- 1 10 Simple Discharge Coordination []  - 0 Complex (extensive) Discharge Coordination PROCESS - Special Needs []  - 0 Pediatric / Minor Patient Management []  - 0 Isolation Patient Management []  - 0 Hearing / Language / Visual  special needs []  - 0 Assessment of Community assistance (transportation, D/C planning, etc.) []  - 0 Additional assistance / Altered mentation []  - 0 Support Surface(s)  Assessment (bed, cushion, seat, etc.) INTERVENTIONS - Wound Cleansing / Measurement X - Simple Wound Cleansing - one wound 1 5 []  - 0 Complex Wound Cleansing - multiple wounds X- 1 5 Wound Imaging (photographs - any number of wounds) []  - 0 Wound Tracing (instead of photographs) X- 1 5 Simple Wound Measurement - one wound []  - 0 Complex Wound Measurement - multiple wounds INTERVENTIONS - Wound Dressings X - Small Wound Dressing one or multiple wounds 1 10 []  - 0 Medium Wound Dressing one or multiple wounds []  - 0 Large Wound Dressing one or multiple wounds []  - 0 Application of Medications - topical []  - 0 Application of Medications - injection INTERVENTIONS - Miscellaneous []  - 0 External ear exam []  - 0 Specimen Collection (cultures, biopsies, blood, body fluids, etc.) []  - 0 Specimen(s) / Culture(s) sent or taken to Lab for analysis []  - 0 Patient Transfer (multiple staff / Nurse, adult / Similar devices) []  - 0 Simple Staple / Suture removal (25 or less) []  - 0 Complex Staple / Suture removal (26 or more) []  - 0 Hypo / Hyperglycemic Management (close monitor of Blood Glucose) Games, Estuardo D ([id number]) [id number] Page 3 of 8 []  - 0 Ankle / Brachial Index (ABI) - do not check if billed separately X- 1 5 Vital Signs Has the patient been seen at the hospital within the last three years: Yes Total Score: 90 Level Of Care: New/Established - Level 3 Electronic Signature(s) Signed: 07/20/2022 6:00:37 PM By: Shawn Stall RN, BSN Entered By: Shawn Stall on 07/20/2022 15:48:22 -------------------------------------------------------------------------------- Encounter Discharge Information Details Patient Name: Date of Service: Romanello, [name] 07/20/2022 3:00 PM Medical Record  Number: [id number] Patient Account Number: [id number] Date of Birth/Sex: Treating RN: 1955/08/18 (67 y.o. [name] Primary Care Masaye Gatchalian: [name] Other Clinician: Referring Alayssa Flinchum: Treating Brian Zeitlin/Extender: Alver Fisher in Treatment: 50 Encounter Discharge Information Items Discharge Condition: Stable Ambulatory Status: Walker Discharge Destination: Home Transportation: Private Auto Accompanied By: self Schedule Follow-up Appointment: Yes Clinical Summary of Care: Electronic Signature(s) Signed: 07/20/2022 6:00:37 PM By: Shawn Stall RN, BSN Entered By: Shawn Stall on 07/20/2022 15:48:54 -------------------------------------------------------------------------------- Lower Extremity Assessment Details Patient Name: Date of Service: Mundis, [name] 07/20/2022 3:00 PM Medical Record Number: [id number] Patient Account Number: [id number] Date of Birth/Sex: Treating RN: [birth year] (67 y.o. [name] Primary Care Kaleth Koy: [name] Other Clinician: Referring Niyati Heinke: Treating Nason Conradt/Extender: Alver Fisher in Treatment: 50 Edema Assessment Assessed: Kyra Searles: No] Franne Forts: Yes] Edema: [Left: N] [Right: o] Calf Left: Right: Point of Measurement: 31 cm From Medial Instep 38.5 cm Ankle Left: Right: Point of Measurement: 11 cm From Medial Instep 26 cm Vascular Assessment JARTAVIOUS, MCKIMMY D ([id number]) [Right:[id number] Page 4 of 8] Pulses: Dorsalis Pedis Palpable: [Right:Yes] Extremity colors, hair growth, and conditions: Extremity Color: [Right:Normal] Hair Growth on Extremity: [Right:No] Temperature of Extremity: [Right:Warm] Capillary Refill: [Right:< 3 seconds] Dependent Rubor: [Right:No] Blanched when Elevated: [Right:No Yes] Toe Nail Assessment Left: Right: Thick: No Discolored: No Deformed: No Improper Length and Hygiene: No Electronic Signature(s) Signed: 07/20/2022  6:00:37 PM By: Shawn Stall RN, BSN Entered By: Shawn Stall on 07/20/2022 15:33:28 -------------------------------------------------------------------------------- Multi-Disciplinary Care Plan Details Patient Name: Date of Service: Alper, [name] 07/20/2022 3:00 PM Medical Record Number: [id number] Patient Account Number: [id number] Date of Birth/Sex: Treating RN: [birth year] (67 y.o. [name] Primary Care Wynn Kernes: [name] Other Clinician: Referring Suzan Manon: Treating Kinzlee Selvy/Extender: Alver Fisher  in Treatment: 50 Multidisciplinary Care Plan reviewed with physician Active Inactive Pain, Acute or Chronic Nursing Diagnoses: Pain, acute or chronic: actual or potential Potential alteration in comfort, pain Goals: Patient will verbalize adequate pain control and receive pain control interventions during procedures as needed Date Initiated: 08/04/2021 Target Resolution Date: 09/10/2022 Goal Status: Active Patient/caregiver will verbalize comfort level met Date Initiated: 08/04/2021 Target Resolution Date: 09/10/2022 Goal Status: Active Interventions: Encourage patient to take pain medications as prescribed Provide education on pain management Reposition patient for comfort Treatment Activities: Administer pain control measures as ordered : 08/04/2021 Notes: Electronic Signature(s) Signed: 07/20/2022 6:00:37 PM By: Shawn Stall RN, BSN Entered By: Shawn Stall on 07/20/2022 15:34:07 Poli, [name] ([id number]) [id number] Page 5 of 8 -------------------------------------------------------------------------------- Pain Assessment Details Patient Name: Date of Service: NAJI, MEHRINGER 07/20/2022 3:00 PM Medical Record Number: [id number] Patient Account Number: [id number] Date of Birth/Sex: Treating RN: 15-Mar-1955 (67 y.o. M) Primary Care Waylen Depaolo: [name] Other Clinician: Referring Sakai Wolford: Treating  Doron Shake/Extender: Alver Fisher in Treatment: 50 Active Problems Location of Pain Severity and Description of Pain Patient Has Paino No Site Locations Pain Management and Medication Current Pain Management: Electronic Signature(s) Signed: 07/20/2022 3:58:30 PM By: Karl Ito Entered By: Karl Ito on 07/20/2022 15:20:48 -------------------------------------------------------------------------------- Patient/Caregiver Education Details Patient Name: Date of Service: Acree, DA Santa Lighter 7/10/2024andnbsp3:00 PM Medical Record Number: [id number] Patient Account Number: [id number] Date of Birth/Gender: Treating RN: 1955/02/23 (66 y.o. [name] Primary Care Physician: [name] Other Clinician: Referring Physician: Treating Physician/Extender: Alver Fisher in Treatment: 50 Education Assessment Education Provided To: Patient Education Topics Provided Wound/Skin Impairment: Handouts: Caring for Your Ulcer Methods: Explain/Verbal ANDREJ, SPAGNOLI ([id number]) [id number] Page 6 of 8 Responses: Reinforcements needed Electronic Signature(s) Signed: 07/20/2022 6:00:37 PM By: Shawn Stall RN, BSN Entered By: Shawn Stall on 07/20/2022 15:34:21 -------------------------------------------------------------------------------- Wound Assessment Details Patient Name: Date of Service: Trillo, [name] 07/20/2022 3:00 PM Medical Record Number: [id number] Patient Account Number: [id number] Date of Birth/Sex: Treating RN: [birth year] (67 y.o. M) Primary Care Waneda Klammer: [name] Other Clinician: Referring Glenis Musolf: Treating Regginald Pask/Extender: Alver Fisher in Treatment: 50 Wound Status Wound Number: 7 Primary Venous Leg Ulcer Etiology: Wound Location: Right, Medial Malleolus Secondary Lymphedema Wounding Event: Blister Etiology: Date Acquired: 07/21/2021 Wound Open Weeks Of  Treatment: 50 Status: Clustered Wound: No Comorbid Sleep Apnea, Arrhythmia, Congestive Heart Failure, History: Hypertension, Hypotension, Peripheral Arterial Disease, Peripheral Venous Disease, Type II Diabetes, Gout, Osteoarthritis Photos Wound Measurements Length: (cm) Width: (cm) Depth: (cm) Area: (cm) Volume: (cm) 4.2 % Reduction in Area: 87.1% 1.5 % Reduction in Volume: 96.3% 0.2 Epithelialization: Medium (34-66%) 4.948 Tunneling: No 0.99 Undermining: No Wound Description Classification: Full Thickness With Exposed Suppo Wound Margin: Thickened Exudate Amount: Medium Exudate Type: Serosanguineous Exudate Color: red, brown rt Structures Foul Odor After Cleansing: No Slough/Fibrino Yes Wound Bed Granulation Amount: Large (67-100%) Exposed Structure Granulation Quality: Red, Pink Fascia Exposed: No Necrotic Amount: Small (1-33%) Fat Layer (Subcutaneous Tissue) Exposed: Yes Tendon Exposed: No Muscle Exposed: No Joint Exposed: No Bone Exposed: No Periwound Skin Texture Texture Color RITCHARD, PARAGAS D ([id number]) [id number] Page 7 of 8 No Abnormalities Noted: No No Abnormalities Noted: No Callus: No Atrophie Blanche: No Crepitus: No Cyanosis: No Excoriation: No Ecchymosis: No Induration: No Erythema: No Rash: No Hemosiderin Staining: Yes Scarring: Yes Mottled: No Pallor: No Moisture Rubor: No No Abnormalities Noted: No Dry / Scaly: No Maceration: Yes Treatment Notes Wound #7 (  Malleolus) Wound Laterality: Right, Medial Cleanser Soap and Water Discharge Instruction: May shower and wash wound with dial antibacterial soap and water prior to dressing change. Vashe 5.8 (oz) Discharge Instruction: Cleanse the wound with Vashe prior to applying a clean dressing using gauze sponges, not tissue or cotton balls. Peri-Wound Care Skin Prep Discharge Instruction: Use skin prep as directed Topical Primary Dressing Hydrofera Blue Ready  Transfer Foam, 4x5 (in/in) Discharge Instruction: Apply to wound bed as instructed Secondary Dressing Zetuvit Plus Silicone Border Dressing 4x4 (in/in) Discharge Instruction: Apply silicone border over primary dressing as directed. Secured With Compression Wrap Medigrip Elasticated Tubular Support Bandage, Size D, 3 (in) Discharge Instruction: apply in the morning and remove at night. Compression Stockings Add-Ons Electronic Signature(s) Signed: 07/20/2022 6:00:37 PM By: Shawn Stall RN, BSN Entered By: Shawn Stall on 07/20/2022 15:33:46 -------------------------------------------------------------------------------- Vitals Details Patient Name: Date of Service: Ibsen, [name] 07/20/2022 3:00 PM Medical Record Number: [id number] Patient Account Number: [id number] Date of Birth/Sex: Treating RN: [birth year] (67 y.o. M) Primary Care Jeanene Mena: [name] Other Clinician: Referring Francy Mcilvaine: Treating Jasha Hodzic/Extender: Alver Fisher in Treatment: 50 Vital Signs Time Taken: 15:20 Temperature (F): 98.6 Height (in): 71 Pulse (bpm): 103 Weight (lbs): 350 Respiratory Rate (breaths/min): 18 Body Mass Index (BMI): 48.8 Blood Pressure (mmHg): 139/83 Reference Range: 80 - 120 mg / dl JIBRIL, MCMINN D ([id number]) [id number] Page 8 of 8 Electronic Signature(s) Signed: 07/20/2022 3:58:30 PM By: Karl Ito Entered By: Karl Ito on 07/20/2022 15:20:42

## 2022-07-27 ENCOUNTER — Encounter (HOSPITAL_BASED_OUTPATIENT_CLINIC_OR_DEPARTMENT_OTHER): Payer: Medicare Other | Admitting: Physician Assistant

## 2022-07-27 DIAGNOSIS — Z6841 Body Mass Index (BMI) 40.0 and over, adult: Secondary | ICD-10-CM | POA: Diagnosis not present

## 2022-07-27 DIAGNOSIS — L97518 Non-pressure chronic ulcer of other part of right foot with other specified severity: Secondary | ICD-10-CM | POA: Diagnosis not present

## 2022-07-27 DIAGNOSIS — I87333 Chronic venous hypertension (idiopathic) with ulcer and inflammation of bilateral lower extremity: Secondary | ICD-10-CM | POA: Diagnosis not present

## 2022-07-27 DIAGNOSIS — L97828 Non-pressure chronic ulcer of other part of left lower leg with other specified severity: Secondary | ICD-10-CM | POA: Diagnosis not present

## 2022-07-27 DIAGNOSIS — S90521A Blister (nonthermal), right ankle, initial encounter: Secondary | ICD-10-CM | POA: Diagnosis not present

## 2022-07-27 DIAGNOSIS — I2510 Atherosclerotic heart disease of native coronary artery without angina pectoris: Secondary | ICD-10-CM | POA: Diagnosis not present

## 2022-07-27 DIAGNOSIS — I890 Lymphedema, not elsewhere classified: Secondary | ICD-10-CM | POA: Diagnosis not present

## 2022-07-27 DIAGNOSIS — I110 Hypertensive heart disease with heart failure: Secondary | ICD-10-CM | POA: Diagnosis not present

## 2022-07-27 DIAGNOSIS — E1151 Type 2 diabetes mellitus with diabetic peripheral angiopathy without gangrene: Secondary | ICD-10-CM | POA: Diagnosis not present

## 2022-07-27 DIAGNOSIS — G4730 Sleep apnea, unspecified: Secondary | ICD-10-CM | POA: Diagnosis not present

## 2022-07-27 DIAGNOSIS — I509 Heart failure, unspecified: Secondary | ICD-10-CM | POA: Diagnosis not present

## 2022-07-27 DIAGNOSIS — L97818 Non-pressure chronic ulcer of other part of right lower leg with other specified severity: Secondary | ICD-10-CM | POA: Diagnosis not present

## 2022-07-27 DIAGNOSIS — Z950 Presence of cardiac pacemaker: Secondary | ICD-10-CM | POA: Diagnosis not present

## 2022-07-27 DIAGNOSIS — M109 Gout, unspecified: Secondary | ICD-10-CM | POA: Diagnosis not present

## 2022-07-27 DIAGNOSIS — Z7901 Long term (current) use of anticoagulants: Secondary | ICD-10-CM | POA: Diagnosis not present

## 2022-07-27 DIAGNOSIS — E11621 Type 2 diabetes mellitus with foot ulcer: Secondary | ICD-10-CM | POA: Diagnosis not present

## 2022-07-27 DIAGNOSIS — M1990 Unspecified osteoarthritis, unspecified site: Secondary | ICD-10-CM | POA: Diagnosis not present

## 2022-07-27 DIAGNOSIS — I872 Venous insufficiency (chronic) (peripheral): Secondary | ICD-10-CM | POA: Diagnosis not present

## 2022-07-27 DIAGNOSIS — G4733 Obstructive sleep apnea (adult) (pediatric): Secondary | ICD-10-CM | POA: Diagnosis not present

## 2022-07-27 NOTE — Progress Notes (Addendum)
[name] ([id number]) [id number] Page 1 of 11 Visit Report for 07/27/2022 Chief Complaint Document Details Patient Name: Date of Service: [name], [name] 07/27/2022 3:00 PM Medical Record Number: [id number] Patient Account Number: [id number] Date of Birth/Sex: Treating RN: 1955-03-05 (67 y.o. M) Primary Care Provider: Martha [name] Other Clinician: Referring Provider: Treating Provider/Extender: [name] in Treatment: 62 Information Obtained from: Patient Chief Complaint 08/04/2021; patient returns to clinic with bilateral leg wounds as well as areas on the right foot Electronic Signature(s) Signed: 07/27/2022 3:03:18 PM By: [name] Entered By: [name] Derry on 07/27/2022 15:03:18 -------------------------------------------------------------------------------- HPI Details Patient Name: Date of Service: [name], [name]. 07/27/2022 3:00 PM Medical Record Number: [id number] Patient Account Number: [id number] Date of Birth/Sex: Treating RN: 1955/12/10 (67 y.o. M) Primary Care Provider: Martha [name] Other Clinician: Referring Provider: Treating Provider/Extender: [name] in Treatment: 70 History of Present Illness HPI Description: ADMISSION 03/22/2021 This is a 67 year old man with a past medical history significant for diabetes type 2, congestive heart failure, peripheral arterial disease, morbid obesity, venous insufficiency, and coronary artery disease. He has been followed by [name] in podiatry, who performed a transmetatarsal amputation on the left foot in August 2022. He had issues healing that wound, but based upon [name] notes, ultimately the TMA wound healed. During his recovery from that surgery, however, ulcers opened up over the DIP joint of the right second and third toe. These have apparently closed and reopened multiple times. It sounds like one of the issues has been  moisture accumulation and maceration of the tissues causing them to reopen. At his last visit with [name], on March 01, 2021, there continues to be problems with moisture and he was referred to wound care for further evaluation and management. He had a formal aortogram with runoff performed prior to his TMA. The findings are copied here: Patient has inline flow to both feet with no significant flow-limiting lesion that would be amenable to percutaneous or open revascularization. He does have an element of small vessel disease and has a short segment occlusion of the distal anterior tibial/dorsalis pedis artery on the left foot but does have posterior tibial artery flow. Would recommend management of wounds with amputation of toes 2 and 3 on the right foot if the wounds do not heal and deteriorate. Transmetatarsal amputation on the left side has as good a blood supply as it is going to get and hopefully this will heal in the future. Formal ABIs were done in January 2023. They are normal bilaterally. ABI Findings: +---------+------------------+-----+----------+--------+ Right Rt Pressure (mmHg)IndexWaveform Comment  +---------+------------------+-----+----------+--------+ Brachial 160     +---------+------------------+-----+----------+--------+ PTA 192 1.06 monophasic  +---------+------------------+-----+----------+--------+ DP 159 0.88 monophasic  +---------+------------------+-----+----------+--------+ Great T oe145 0.80    +---------+------------------+-----+----------+--------+ +--------+------------------+-----+---------+-------+ [name] ([id number]) [id number] Page 2 of 11 Left Lt Pressure (mmHg)IndexWaveform Comment +--------+------------------+-----+---------+-------+ [name]     +--------+------------------+-----+---------+-------+ PTA 204 1.13 triphasic   +--------+------------------+-----+---------+-------+ DP 194 1.07 biphasic   +--------+------------------+-----+---------+-------+ +-------+-----------+-----------+------------+------------+ ABI/TBIT oday's ABIT oday's TBIPrevious ABIPrevious TBI +-------+-----------+-----------+------------+------------+ Right 1.06 0.8 1.26 0.65  +-------+-----------+-----------+------------+------------+ Left 1.13 amputation 1.15 amputation  +-------+-----------+-----------+------------+------------+ Previous ABI on 08/06/20 at [facility] Pedal pressures falsely elevated due to medial calcification. Summary: Right: Resting right ankle-brachial index is within normal range. The right toe-brachial index is normal. Left: Resting left ankle-brachial index is within normal range. READMISSION 08/04/2021 [name] is now a 67 year old man who I remember from this clinic many years ago I  think he had a right lower extremity predominantly venous wound at the time. He was here for 1 visit in March of this year had wounds on his right second and third toes we apparently dressed them many and they healed so he did not come back. He is listed in [name] is being a diabetic although the patient denies this says he is verified it with his primary doctor. In any case over the last several weeks or so according the patient although these wounds look somewhat more chronic than that he has developed predominantly large wounds on the right medial and right lateral ankle smaller areas on the left leg and areas on the dorsal aspect of his right second and third toes. Its not clear how he has been dressing these. 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 The patient has known PAD. He had an angiogram in August 2022 at which time he had nonhealing wounds in both feet. On the left his major vessels in the thigh were all  patent. He had three-vessel patent to the level of the ankle. He had a very short occlusion in the left anterior tibial. On the right lower extremity the major vessels in his thigh were all patent. He had three-vessel runoff to the foot sluggish filling of the anterior tibial artery. He was felt to have some component of small vessel disease but nothing that was amenable or needed revascularization. It was recommended that he have amputation of the second and third toes on the right foot if they did not heal He has been following with [name] of podiatry. [name] got him juxta lite stockings although I do not think he had them on properly he has uncontrolled edema in both legs Past medical history includes type 2 diabetes [although the patient really denies this], left TMA in 2022,lower extremity wounds in fact attendance at this clinic in 2009-2010, A-fib on Eliquis, chronic venous insufficiency with secondary lymphedema history of non-Hodgkin's lymphoma. 08-11-2021 upon evaluation today patient presents for follow-up evaluation he was seen last Wednesday initially for inspection here in our clinic. With that being said he tells me that he unfortunately has been having a lot of drainage and is actually coming through his wrap. Fortunately I do not see any evidence of active infection locally or systemically at this time which is great news. No fevers, chills, nausea, vomiting, or diarrhea. With that being said there does appear to be some evidence of local infection based on what I am seeing today. 08-18-2021 upon evaluation today patient appears to be doing okay currently in regard to his wounds with that being said that he is doing much better but still has a long ways to go to get to where he wanted to be. I think the infection is significantly improved. He has another week of the antibiotic at this point. 08-25-2021 upon evaluation today patient's wounds are actually doing decently well he has  erythema has significantly improved. I think the cellulitis is under controlled I am going to continue him on 2 more weeks of the Levaquin at 500 mg this is a lower dose but I am hoping it will be better for him. 09-08-2021 upon evaluation today patient appears to be doing excellent in regard to his wounds. Since I last saw him he was actually in the hospital where he ended up having a pacemaker put in. Subsequently he tells  me that he is actually doing quite well although they were unsure whether they were going to do it due to the fact that he had the wounds on his legs. And then I am glad they did anything seem to be doing well. 09-15-2021 upon evaluation patient's wounds are actually showing signs of improvement. The right side wounds do appear to have some need for sharp debridement today and I Ernie Hew go ahead and proceed with that. I think that if we get the wounds cleaned up he will actually show signs of continued improvement. I am also leaning towards switching to [facility] which I think will be a much better option for him. 09-22-2021 patient's wounds are showing signs of excellent improvement. I am actually extremely pleased with where we stand and I think that the patient is making great progress. There does not appear to be any signs of active infection. 09-29-2021 upon evaluation today patient appears to be doing excellent in regard to his wounds. He is actually tolerating the dressing changes without complication. Fortunately I see no evidence of active infection locally or systemically at this time which is great news and overall I am extremely pleased with where we stand currently. [id number] upon evaluation today patient actually appears to be doing excellent in regard to his wounds. The left leg is almost completely healed the right ankle is significantly smaller. Overall I am extremely pleased with where we stand at this point. I do not see any evidence of active infection at this  time. 10-13-2021 upon evaluation today patient appears to be doing excellent in regard to his wounds. I really feel like he is making good progress here and I am very pleased in that regard. Fortunately I do not see any signs of active infection at this time. We are using the [facility] topical antibiotic therapy. 10-20-2021 upon evaluation today patient appears to be doing well with regard to his wound on the right medial ankle region the left leg is almost completely healed. I am actually very pleased with where we stand today. 11-03-2021 upon evaluation today patient's wound actually is going require some sharp debridement but appears to be doing much better which is great news. Fortunately I do not see any signs of active infection at this time. 11-10-2021 upon evaluation today patient's wound is actually showing signs of excellent improvement. Fortunately I do not see any evidence of infection locally [name], [name] ([id number]) [id number] Page 3 of 11 or systemically which is great news and overall I am extremely pleased with where we stand today. I do believe he is making good progress he does have his [facility] topical antibiotics with him here today. 11-24-2021 upon evaluation today patient actually showing signs of excellent improvement this appears to be doing much better. Fortunately I do not see any evidence of infection locally or systemically at this time. 12-01-2021 upon evaluation today patient appears to be doing well currently in regard to his wound. He has been tolerating the dressing changes without complication. Fortunately I do not see any evidence of active infection at this time which is great news and overall I am extremely pleased with where we stand today. 12-15-2021 upon evaluation today patient appears to be doing well with regard to his wound. He is showing signs of improvement is slow but nonetheless we are making improvements here. Fortunately I  do not see any evidence of active infection locally nor systemically at this time. We are still using the [facility] topical antibiotic  over the open area only. 12-29-2021 upon evaluation today patient's wound actually is showing signs of excellent improvement. It has been 2 weeks since I perform any debridement and it definitely shows he has some tissue that needs to be cleaned away but I think we can do so quite easily and readily today. The good news is I do not see any signs of infection I think he is doing much better in that regard. Overall I am extremely pleased with where we stand. 01-12-2022 upon evaluation today patient appears to be doing well currently in regard to his wound although it is not getting significantly smaller it is also not getting any larger. Fortunately I do not see any evidence of infection locally nor systemically which is great news and overall I am extremely pleased with where things stand currently. 01-19-2022 upon evaluation today patient appears to be doing well currently in regard to his wound. The PolyMem actually seems to have done extremely well for him. Fortunately I do not see any signs of infection locally nor systemically at this time. 01-26-2022 upon evaluation today patient appears to be making progress. Fortunately there does not appear to be any signs of infection which is great news. No fevers, chills, nausea, vomiting, or diarrhea. With that being said this is very slow to heal and although it is smaller I still feel like we may want to try to do something to speed this up I think that a skin substitute could be beneficial, look into Kerecis. 02-02-2022 upon evaluation today patient's wound is actually showing signs of excellent improvement. I do not see any evidence of infection and overall I think that we are headed in the right direction. Fortunately I think that he is tolerating the dressing changes without complication. 02-09-2022 upon evaluation today  patient appears to be doing well currently in regard to his wound. It does look a little bit macerated but fortunately does not appear to be showing any signs of significant skin breakdown over the macerated area which is good news. Fortunately I do not see any evidence of active infection locally nor systemically at this point which is great news. 02-23-2022 upon evaluation today patient appears to be doing well currently in regard to his wounds. In fact his area on the ankle is actually showing signs of healing quite nicely. We do have the Apligraf ready today I am hopeful this is going to speed things up and get this closed much more effectively and quickly. Fortunately I do not see any signs of active infection locally nor systemically at this time which is great news. 03-02-2022 upon evaluation today patient appears to be doing excellent in regard to his wound. He is actually been tolerating the dressing changes without complication the wound on the left medial lower extremity is doing quite well and Apligraf seems to have been extremely beneficial for him. I am extremely pleased with where we stand today. 03/16/2022: The wound measurements are smaller today. He has accumulation of a yellow crust around the edges and slough on the surface. There is a musty odor coming from the wound. He has been getting Apligraf. 03-30-2022 upon evaluation today patient appears to be making progress. In regard to his leg ulcer. This is slow but nonetheless the Apligraf has fed things up. Fortunately I do not see any evidence of infection locally nor systemically at this time. No fevers, chills, nausea, vomiting, or diarrhea. Patient is here for Apligraf #4 today. 04-13-2022 upon evaluation today patient  appears to be doing a little bit more poorly in regard to his wound. He actually feels like his wrap may have been a little bit tight. Fortunately there does not appear to be any signs of active infection locally nor  systemically which is great news and I am pleased in that regard. 04-20-2022 upon evaluation today patient appears to be doing better in regard to his wound from the standpoint of swelling he is actually lost 33 pounds on torsemide in the past week his leg is significantly smaller compared to what it has been. Fortunately I do not see any signs of active infection locally nor systemically at this time. 04-27-2022 upon evaluation today patient's wound actually is showing signs of erythema and warmth around the edges of the wound I am actually concerned about the possibility of infection. I think we probably need to obtain a wound culture and also can recommend based on what we are seeing that we go ahead and have the patient continue to utilize the compression wrapping which I think has been of benefit. 05-04-2022 upon evaluation today patient appears to be doing well currently in regard to his wound. He has been tolerating the dressing changes without complication. Fortunately there does not appear to be any signs of active infection locally or systemically which is great news. 05-11-2022 upon evaluation today patient actually appears to be doing excellent. He has been tolerating the dressing changes we actually were trialing a new medication on him last week and this was the ergo clean Ag dressing. With that being said this actually seems to have done extremely well at this point. Fortunately I do not see any signs of active infection locally or systemically at this time. 05-18-2022 upon evaluation today patient appears to be doing okay in regard to his wound I do not see any signs of worsening also notes any signs of significant improvement. Fortunately I do believe that removing in the right direction in general. 05-25-22 upon evaluation today patient appears to be doing well currently in regard to his wound. He has been tolerating the dressing changes without complication. Fortunately I do not see any  signs of active infection locally nor systemically which is great news and overall I am extremely pleased with where we stand currently. There does not appear to be any signs of active infection locally nor systemically which is great news. No fevers, chills, nausea, vomiting, or diarrhea. 06-01-2022 upon evaluation today patient actually appears to be making excellent progress in regard to his ankle region. I am very pleased with where we stand I think that he is doing extremely well and very pleased with endoform and drawtex combination. 06-08-2022 upon evaluation today patient appears to be doing well currently in regard to his wound. He has been making good progress and I feel like practicing improvement in the overall size of the wound. I am very pleased in that regard. Fortunately I do not see any signs of active infection locally nor systemically which is great news. No fevers, chills, nausea, vomiting, or diarrhea. 06-15-2022 upon evaluation today patient appears to be doing well currently in regard to his wound. He has been tolerating the dressing changes without complication. I do believe the endoform has been extremely helpful for him up to this point. I do not see any signs of active infection locally nor systemically which is great news and in general I do believe that we are making pretty good progress here which is excellent news. 06-22-2022 upon  evaluation today patient appears to be doing well currently in regard to his wound. He has been tolerating the dressing changes without complication. Fortunately there does not appear to be any signs of active infection at this time which is great news. I think that he is actually taking good progress with the endoform which is excellent as well. [name], [name] ([id number]) [id number] Page 4 of 11 06-29-2022 upon evaluation patient appears to be doing well currently in regard to his wound. He is actually making good  progress towards closure and I am very pleased in that regard. I do not see any evidence of active infection locally nor systemically which is great news and in general I do believe that we are making excellent progress here overall. 07-06-2022 upon evaluation today patient appears to be doing well currently in regard to his wound. He has been tolerating the dressing changes without complication. The good news is he is slowly getting smaller each time I see him. 07-13-2022 upon evaluation today patient appears to be doing well currently in regard to his wound is slowly showing signs of improvement. Fortunately I do not see any signs of infection at this time. No fevers, chills, nausea, vomiting, or diarrhea. 07-20-2022 upon evaluation today patient appears to be doing well currently in regard to his wound. He has been tolerating the dressing changes without complication. Fortunately I do not see any evidence of active infection locally or systemically which is great news and in general I do believe that we are making headway towards complete closure. This is excellent news. With that being said we are preparing for him to be leaving to go out of town in about a week and a half. For that reason we will going to make a few changes to his dressing today in order to give him something that I think will be easier for him to do when he is out of town. 07-27-2022 upon evaluation today patient appears to be doing well currently in regard to his wound. He has been tolerating the dressing changes without complication. Fortunately there does not appear to be any signs of infection. Fortunately there does not appear to be any signs of infection Electronic Signature(s) Signed: 07/27/2022 3:34:18 PM By: [name] Entered By: [name] Derry on 07/27/2022 15:34:17 -------------------------------------------------------------------------------- Physical Exam Details Patient Name: Date of Service: Ficken, [name].  07/27/2022 3:00 PM Medical Record Number: [id number] Patient Account Number: [id number] Date of Birth/Sex: Treating RN: [birth year] (67 y.o. M) Primary Care Provider: Martha [name] Other Clinician: Referring Provider: Treating Provider/Extender: [name] in Treatment: 10 Constitutional Well-nourished and well-hydrated in no acute distress. Respiratory normal breathing without difficulty. Psychiatric this patient is able to make decisions and demonstrates good insight into disease process. Alert and Oriented x 3. pleasant and cooperative. Notes Upon inspection patient's wound bed actually showed signs of good granulation epithelization at this point. Fortunately I do not see any evidence of worsening overall and in general I do think that we are making headway towards complete closure which is excellent news as well. Electronic Signature(s) Signed: 07/27/2022 3:34:41 PM By: [name] Entered By: [name] Derry on 07/27/2022 15:34:41 -------------------------------------------------------------------------------- Physician Orders Details Patient Name: Date of Service: Lasure, [name]. 07/27/2022 3:00 PM Medical Record Number: [id number] Patient Account Number: [id number] Date of Birth/Sex: Treating RN: 21-Oct-1955 (67 y.o. [name] Primary Care Provider: Martha [name] Other Clinician: Referring Provider: Treating Provider/Extender: [name]  in Treatment: 52 Verbal / Phone Orders: No Diagnosis Coding [name], [name] ([id number]) [id number] Page 5 of 11 ICD-10 Coding Code Description I89.0 Lymphedema, not elsewhere classified I87.333 Chronic venous hypertension (idiopathic) with ulcer and inflammation of bilateral lower extremity L97.828 Non-pressure chronic ulcer of other part of left lower leg with other specified severity L97.818 Non-pressure chronic ulcer of other part of right lower leg with  other specified severity E11.621 Type 2 diabetes mellitus with foot ulcer L97.518 Non-pressure chronic ulcer of other part of right foot with other specified severity Follow-up Appointments ppointment in 2 weeks. Leonard Schwartz Wednesday Room 8 (already has an appt). Return A Anesthetic (In clinic) Topical Lidocaine 5% applied to wound bed - Used in Clinic Prior to debridement Cellular or Tissue Based Products Cellular or Tissue Based Product Type: - Run IVR for Kerrecis- denied #1 Apligraf applied 02/23/2022 #2 Apligraf applied 03/02/2022 #3 Apligraf applied 03/16/2022 #4 Apligraf applied 03/30/2022 HOLD APLIGRAF this week. Cellular or Tissue Based Product applied to wound bed, secured with steri-strips, cover with Adaptic or Mepitel. (DO NOT REMOVE). Bathing/ Shower/ Hygiene May shower with protection but do not get wound dressing(s) wet. Protect dressing(s) with water repellant cover (for example, large plastic bag) or a cast cover and may then take shower. Edema Control - Lymphedema / SCD / Other Elevate legs to the level of the heart or above for 30 minutes daily and/or when sitting for 3-4 times a day throughout the day. Avoid standing for long periods of time. Patient to wear own compression stockings every day. - apply in the morning and remove at night left leg. Exercise regularly Moisturize legs daily. - apply every night before bed. Compression stocking or Garment 30-40 mm/Hg pressure to: - wear your VIVE compression stocking to left leg. apply in the morning and remove at night. Wound Treatment Wound #7 - Malleolus Wound Laterality: Right, Medial Cleanser: Soap and Water 1 x Per Day/30 Days Discharge Instructions: May shower and wash wound with dial antibacterial soap and water prior to dressing change. Cleanser: Vashe 5.8 (oz) 1 x Per Day/30 Days Discharge Instructions: Cleanse the wound with Vashe prior to applying a clean dressing using gauze sponges, not tissue or cotton  balls. Peri-Wound Care: Skin Prep (Generic) 1 x Per Day/30 Days Discharge Instructions: Use skin prep as directed Prim Dressing: Hydrofera Blue Ready Transfer Foam, 4x5 (in/in) (Generic) 1 x Per Day/30 Days ary Discharge Instructions: Apply to wound bed as instructed Secondary Dressing: Zetuvit Plus Silicone Border Dressing 4x4 (in/in) (Generic) 1 x Per Day/30 Days Discharge Instructions: Apply silicone border over primary dressing as directed. Compression Wrap: Medigrip Elasticated Tubular Support Bandage, Size [name], 3 (in) (Generic) 1 x Per Day/30 Days Discharge Instructions: apply in the morning and remove at night. Electronic Signature(s) Signed: 07/27/2022 4:26:17 PM By: [name] Signed: 07/27/2022 5:02:36 PM By: Shawn Stall RN, BSN Entered By: Shawn Stall on 07/27/2022 15:25:11 -------------------------------------------------------------------------------- Problem List Details Patient Name: Date of Service: Sharrar, [name]. 07/27/2022 3:00 PM Medical Record Number: [id number] Patient Account Number: [id number] Date of Birth/Sex: Treating RN: [birth year] (67 y.o. ERICA, OSUNA [name] ([id number]) [id number] Page 6 of 11 Primary Care Provider: Martha [name] Other Clinician: Referring Provider: Treating Provider/Extender: [name] in Treatment: 24 Active Problems ICD-10 Encounter Code Description Active Date MDM Diagnosis I89.0 Lymphedema, not elsewhere classified 08/04/2021 No Yes I87.333 Chronic venous hypertension (idiopathic) with ulcer and inflammation of 08/04/2021 No Yes bilateral lower extremity L97.828 Non-pressure chronic ulcer  of other part of left lower leg with other specified 08/04/2021 No Yes severity L97.818 Non-pressure chronic ulcer of other part of right lower leg with other specified 08/04/2021 No Yes severity E11.621 Type 2 diabetes mellitus with foot ulcer 08/04/2021 No Yes L97.518 Non-pressure chronic  ulcer of other part of right foot with other specified 08/04/2021 No Yes severity Inactive Problems Resolved Problems Electronic Signature(s) Signed: 07/27/2022 3:02:38 PM By: [name] Entered By: [name] Derry on 07/27/2022 15:02:38 -------------------------------------------------------------------------------- Progress Note Details Patient Name: Date of Service: [name], [name]. 07/27/2022 3:00 PM Medical Record Number: [id number] Patient Account Number: [id number] Date of Birth/Sex: Treating RN: 02-Mar-1955 (67 y.o. M) Primary Care Provider: Martha [name] Other Clinician: Referring Provider: Treating Provider/Extender: [name] in Treatment: 23 Subjective Chief Complaint Information obtained from Patient 08/04/2021; patient returns to clinic with bilateral leg wounds as well as areas on the right foot History of Present Illness (HPI) ADMISSION 03/22/2021 This is a 67 year old man with a past medical history significant for diabetes type 2, congestive heart failure, peripheral arterial disease, morbid obesity, venous insufficiency, and coronary artery disease. He has been followed by [name] in podiatry, who performed a transmetatarsal amputation on the left foot in August 2022. He had issues healing that wound, but based upon [name] notes, ultimately the TMA wound healed. During his recovery from that [name], [name] ([id number]) [phone].pdf Page 7 of 11 surgery, however, ulcers opened up over the DIP joint of the right second and third toe. These have apparently closed and reopened multiple times. It sounds like one of the issues has been moisture accumulation and maceration of the tissues causing them to reopen. At his last visit with [name], on March 01, 2021, there continues to be problems with moisture and he was referred to wound care for further evaluation and management. He had a formal aortogram with runoff  performed prior to his TMA. The findings are copied here: Patient has inline flow to both feet with no significant flow-limiting lesion that would be amenable to percutaneous or open revascularization. He does have an element of small vessel disease and has a short segment occlusion of the distal anterior tibial/dorsalis pedis artery on the left foot but does have posterior tibial artery flow. Would recommend management of wounds with amputation of toes 2 and 3 on the right foot if the wounds do not heal and deteriorate. Transmetatarsal amputation on the left side has as good a blood supply as it is going to get and hopefully this will heal in the future. Formal ABIs were done in January 2023. They are normal bilaterally. ABI Findings: +---------+------------------+-----+----------+--------+ Right Rt Pressure (mmHg)IndexWaveform Comment  +---------+------------------+-----+----------+--------+ Brachial 160    +---------+------------------+-----+----------+--------+ PTA 192 1.06 monophasic  +---------+------------------+-----+----------+--------+ DP 159 0.88 monophasic  +---------+------------------+-----+----------+--------+ Great T oe145 0.80    +---------+------------------+-----+----------+--------+ +--------+------------------+-----+---------+-------+ Left Lt Pressure (mmHg)IndexWaveform Comment +--------+------------------+-----+---------+-------+ [id number]    +--------+------------------+-----+---------+-------+ PTA 204 1.13 triphasic  +--------+------------------+-----+---------+-------+ DP 194 1.07 biphasic   +--------+------------------+-----+---------+-------+ +-------+-----------+-----------+------------+------------+ ABI/TBIT oday's ABIT oday's TBIPrevious ABIPrevious TBI +-------+-----------+-----------+------------+------------+ Right 1.06 0.8 1.26 0.65   +-------+-----------+-----------+------------+------------+ Left 1.13 amputation 1.15 amputation  +-------+-----------+-----------+------------+------------+ [facility] ABI on 08/06/[facility] at [facility] Pedal pressures falsely elevated due to medial calcification. Summary: Right: Resting right ankle-brachial index is within normal range. The right toe-brachial index is normal. Left: Resting left ankle-brachial index is within normal range. READMISSION 08/04/2021 Mr. Courtwright is now a 67 year old man who I remember from this clinic many years ago I think he  had a right lower extremity predominantly venous wound at the time. He was here for 1 visit in March of this year had wounds on his right second and third toes we apparently dressed them many and they healed so he did not come back. He is listed in [place] is being a diabetic although the patient denies this says he is verified it with his primary doctor. In any case over the last several weeks or so according the patient although these wounds look somewhat more chronic than that he has developed predominantly large wounds on the right medial and right lateral ankle smaller areas on the left leg and areas on the dorsal aspect of his right second and third toes. Its not clear how he has been dressing these. 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 The patient has known PAD. He had an angiogram in August 2022 at which time he had nonhealing wounds in both feet. On the left his major vessels in the thigh were all patent. He had three-vessel patent to the level of the ankle. He had a very short occlusion in the left anterior tibial. On the right lower extremity the major vessels in his thigh were all patent. He had three-vessel runoff to the foot sluggish filling of the anterior tibial artery. He was felt to have some component of small vessel disease but nothing that  was amenable or needed revascularization. It was recommended that he have amputation of the second and third toes on the right foot if they did not heal He has been following with [name] of podiatry. [name] got him juxta lite stockings although I do not think he had them on properly he has uncontrolled edema in both legs Past medical history includes type 2 diabetes [although the patient really denies this], left TMA in 2022,lower extremity wounds in fact attendance at this clinic in 2009-2010, A-fib on Eliquis, chronic venous insufficiency with secondary lymphedema history of non-Hodgkin's lymphoma. 08-11-2021 upon evaluation today patient presents for follow-up evaluation he was seen last Wednesday initially for inspection here in our clinic. With that being said he tells me that he unfortunately has been having a lot of drainage and is actually coming through his wrap. Fortunately I do not see any evidence of active infection locally or systemically at this time which is great news. No fevers, chills, nausea, vomiting, or diarrhea. With that being said there does appear to be some evidence of local infection based on what I am seeing today. 08-18-2021 upon evaluation today patient appears to be doing okay currently in regard to his wounds with that being said that he is doing much better but still has a long ways to go to get to where he wanted to be. I think the infection is significantly improved. He has another week of the antibiotic at this point. 08-25-2021 upon evaluation today patient's wounds are actually doing decently well he has erythema has significantly improved. I think the cellulitis is under controlled I am going to continue him on 2 more weeks of the Levaquin at 500 mg this is a lower dose but I am hoping it will be better for him. 09-08-2021 upon evaluation today patient appears to be doing excellent in regard to his wounds. Since I last saw him he was actually in the hospital  where he ended up having a pacemaker put in. Subsequently he tells me that  he is actually doing quite well although they were unsure whether they were going to do it [name], [name] ([id number]) [id number] Page 8 of 11 due to the fact that he had the wounds on his legs. And then I am glad they did anything seem to be doing well. 09-15-2021 upon evaluation patient's wounds are actually showing signs of improvement. The right side wounds do appear to have some need for sharp debridement today and I Ernie Hew go ahead and proceed with that. I think that if we get the wounds cleaned up he will actually show signs of continued improvement. I am also leaning towards switching to [facility] which I think will be a much better option for him. 09-22-2021 patient's wounds are showing signs of excellent improvement. I am actually extremely pleased with where we stand and I think that the patient is making great progress. There does not appear to be any signs of active infection. 09-29-2021 upon evaluation today patient appears to be doing excellent in regard to his wounds. He is actually tolerating the dressing changes without complication. Fortunately I see no evidence of active infection locally or systemically at this time which is great news and overall I am extremely pleased with where we stand currently. [id number] upon evaluation today patient actually appears to be doing excellent in regard to his wounds. The left leg is almost completely healed the right ankle is significantly smaller. Overall I am extremely pleased with where we stand at this point. I do not see any evidence of active infection at this time. 10-13-2021 upon evaluation today patient appears to be doing excellent in regard to his wounds. I really feel like he is making good progress here and I am very pleased in that regard. Fortunately I do not see any signs of active infection at this time. We are using the [facility]  topical antibiotic therapy. 10-20-2021 upon evaluation today patient appears to be doing well with regard to his wound on the right medial ankle region the left leg is almost completely healed. I am actually very pleased with where we stand today. 11-03-2021 upon evaluation today patient's wound actually is going require some sharp debridement but appears to be doing much better which is great news. Fortunately I do not see any signs of active infection at this time. 11-10-2021 upon evaluation today patient's wound is actually showing signs of excellent improvement. Fortunately I do not see any evidence of infection locally or systemically which is great news and overall I am extremely pleased with where we stand today. I do believe he is making good progress he does have his [facility] topical antibiotics with him here today. 11-24-2021 upon evaluation today patient actually showing signs of excellent improvement this appears to be doing much better. Fortunately I do not see any evidence of infection locally or systemically at this time. 12-01-2021 upon evaluation today patient appears to be doing well currently in regard to his wound. He has been tolerating the dressing changes without complication. Fortunately I do not see any evidence of active infection at this time which is great news and overall I am extremely pleased with where we stand today. 12-15-2021 upon evaluation today patient appears to be doing well with regard to his wound. He is showing signs of improvement is slow but nonetheless we are making improvements here. Fortunately I do not see any evidence of active infection locally nor systemically at this time. We are still using the [facility] topical antibiotic over the open  area only. 12-29-2021 upon evaluation today patient's wound actually is showing signs of excellent improvement. It has been 2 weeks since I perform any debridement and it definitely shows he has some tissue that  needs to be cleaned away but I think we can do so quite easily and readily today. The good news is I do not see any signs of infection I think he is doing much better in that regard. Overall I am extremely pleased with where we stand. 01-12-2022 upon evaluation today patient appears to be doing well currently in regard to his wound although it is not getting significantly smaller it is also not getting any larger. Fortunately I do not see any evidence of infection locally nor systemically which is great news and overall I am extremely pleased with where things stand currently. 01-19-2022 upon evaluation today patient appears to be doing well currently in regard to his wound. The PolyMem actually seems to have done extremely well for him. Fortunately I do not see any signs of infection locally nor systemically at this time. 01-26-2022 upon evaluation today patient appears to be making progress. Fortunately there does not appear to be any signs of infection which is great news. No fevers, chills, nausea, vomiting, or diarrhea. With that being said this is very slow to heal and although it is smaller I still feel like we may want to try to do something to speed this up I think that a skin substitute could be beneficial, look into Kerecis. 02-02-2022 upon evaluation today patient's wound is actually showing signs of excellent improvement. I do not see any evidence of infection and overall I think that we are headed in the right direction. Fortunately I think that he is tolerating the dressing changes without complication. 02-09-2022 upon evaluation today patient appears to be doing well currently in regard to his wound. It does look a little bit macerated but fortunately does not appear to be showing any signs of significant skin breakdown over the macerated area which is good news. Fortunately I do not see any evidence of active infection locally nor systemically at this point which is great news. 02-23-2022  upon evaluation today patient appears to be doing well currently in regard to his wounds. In fact his area on the ankle is actually showing signs of healing quite nicely. We do have the Apligraf ready today I am hopeful this is going to speed things up and get this closed much more effectively and quickly. Fortunately I do not see any signs of active infection locally nor systemically at this time which is great news. 03-02-2022 upon evaluation today patient appears to be doing excellent in regard to his wound. He is actually been tolerating the dressing changes without complication the wound on the left medial lower extremity is doing quite well and Apligraf seems to have been extremely beneficial for him. I am extremely pleased with where we stand today. 03/16/2022: The wound measurements are smaller today. He has accumulation of a yellow crust around the edges and slough on the surface. There is a musty odor coming from the wound. He has been getting Apligraf. 03-30-2022 upon evaluation today patient appears to be making progress. In regard to his leg ulcer. This is slow but nonetheless the Apligraf has fed things up. Fortunately I do not see any evidence of infection locally nor systemically at this time. No fevers, chills, nausea, vomiting, or diarrhea. Patient is here for Apligraf #4 today. 04-13-2022 upon evaluation today patient appears to  be doing a little bit more poorly in regard to his wound. He actually feels like his wrap may have been a little bit tight. Fortunately there does not appear to be any signs of active infection locally nor systemically which is great news and I am pleased in that regard. 04-20-2022 upon evaluation today patient appears to be doing better in regard to his wound from the standpoint of swelling he is actually lost 33 pounds on torsemide in the past week his leg is significantly smaller compared to what it has been. Fortunately I do not see any signs of active infection  locally nor systemically at this time. 04-27-2022 upon evaluation today patient's wound actually is showing signs of erythema and warmth around the edges of the wound I am actually concerned about the possibility of infection. I think we probably need to obtain a wound culture and also can recommend based on what we are seeing that we go ahead and have the patient continue to utilize the compression wrapping which I think has been of benefit. 05-04-2022 upon evaluation today patient appears to be doing well currently in regard to his wound. He has been tolerating the dressing changes without complication. Fortunately there does not appear to be any signs of active infection locally or systemically which is great news. 05-11-2022 upon evaluation today patient actually appears to be doing excellent. He has been tolerating the dressing changes we actually were trialing a new [name], [name] ([id number]) [id number] Page 9 of 11 medication on him last week and this was the ergo clean Ag dressing. With that being said this actually seems to have done extremely well at this point. Fortunately I do not see any signs of active infection locally or systemically at this time. 05-18-2022 upon evaluation today patient appears to be doing okay in regard to his wound I do not see any signs of worsening also notes any signs of significant improvement. Fortunately I do believe that removing in the right direction in general. 05-25-22 upon evaluation today patient appears to be doing well currently in regard to his wound. He has been tolerating the dressing changes without complication. Fortunately I do not see any signs of active infection locally nor systemically which is great news and overall I am extremely pleased with where we stand currently. There does not appear to be any signs of active infection locally nor systemically which is great news. No fevers, chills, nausea, vomiting, or  diarrhea. 06-01-2022 upon evaluation today patient actually appears to be making excellent progress in regard to his ankle region. I am very pleased with where we stand I think that he is doing extremely well and very pleased with endoform and drawtex combination. 06-08-2022 upon evaluation today patient appears to be doing well currently in regard to his wound. He has been making good progress and I feel like practicing improvement in the overall size of the wound. I am very pleased in that regard. Fortunately I do not see any signs of active infection locally nor systemically which is great news. No fevers, chills, nausea, vomiting, or diarrhea. 06-15-2022 upon evaluation today patient appears to be doing well currently in regard to his wound. He has been tolerating the dressing changes without complication. I do believe the endoform has been extremely helpful for him up to this point. I do not see any signs of active infection locally nor systemically which is great news and in general I do believe that we are making pretty good progress  here which is excellent news. 06-22-2022 upon evaluation today patient appears to be doing well currently in regard to his wound. He has been tolerating the dressing changes without complication. Fortunately there does not appear to be any signs of active infection at this time which is great news. I think that he is actually taking good progress with the endoform which is excellent as well. 06-29-2022 upon evaluation patient appears to be doing well currently in regard to his wound. He is actually making good progress towards closure and I am very pleased in that regard. I do not see any evidence of active infection locally nor systemically which is great news and in general I do believe that we are making excellent progress here overall. 07-06-2022 upon evaluation today patient appears to be doing well currently in regard to his wound. He has been tolerating the  dressing changes without complication. The good news is he is slowly getting smaller each time I see him. 07-13-2022 upon evaluation today patient appears to be doing well currently in regard to his wound is slowly showing signs of improvement. Fortunately I do not see any signs of infection at this time. No fevers, chills, nausea, vomiting, or diarrhea. 07-20-2022 upon evaluation today patient appears to be doing well currently in regard to his wound. He has been tolerating the dressing changes without complication. Fortunately I do not see any evidence of active infection locally or systemically which is great news and in general I do believe that we are making headway towards complete closure. This is excellent news. With that being said we are preparing for him to be leaving to go out of town in about a week and a half. For that reason we will going to make a few changes to his dressing today in order to give him something that I think will be easier for him to do when he is out of town. 07-27-2022 upon evaluation today patient appears to be doing well currently in regard to his wound. He has been tolerating the dressing changes without complication. Fortunately there does not appear to be any signs of infection. Fortunately there does not appear to be any signs of infection Objective Constitutional Well-nourished and well-hydrated in no acute distress. Vitals Time Taken: 2:55 PM, Height: 71 in, Weight: 350 lbs, BMI: 48.8, Temperature: 98.6 F, Pulse: 76 bpm, Respiratory Rate: 18 breaths/min, Blood Pressure: 129/77 mmHg. Respiratory normal breathing without difficulty. Psychiatric this patient is able to make decisions and demonstrates good insight into disease process. Alert and Oriented x 3. pleasant and cooperative. General Notes: Upon inspection patient's wound bed actually showed signs of good granulation epithelization at this point. Fortunately I do not see any evidence of worsening  overall and in general I do think that we are making headway towards complete closure which is excellent news as well. Integumentary (Hair, Skin) Wound #7 status is Open. Original cause of wound was Blister. The date acquired was: 07/21/2021. The wound has been in treatment 51 weeks. The wound is located on the Right,Medial Malleolus. The wound measures 3.8cm length x 1.2cm width x 0.2cm depth; 3.581cm^2 area and 0.716cm^3 volume. There is Fat Layer (Subcutaneous Tissue) exposed. There is no tunneling or undermining noted. There is a medium amount of serosanguineous drainage noted. The wound margin is thickened. There is large (67-100%) red, pink granulation within the wound bed. There is a small (1-33%) amount of necrotic tissue within the wound bed. The periwound skin appearance exhibited: Scarring, Hemosiderin Staining. The  periwound skin appearance did not exhibit: Callus, Crepitus, Excoriation, Induration, Rash, Dry/Scaly, Maceration, Atrophie Blanche, Cyanosis, Ecchymosis, Mottled, Pallor, Rubor, Erythema. 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 Leonard Schwartz Wednesday Room 8 (already has an appt). Anesthetic: (In clinic) Topical Lidocaine 5% applied to wound bed - Used in Clinic Prior to debridement Cellular or Tissue Based Products: Cellular or Tissue Based Product Type: - Run IVR for Kerrecis- denied #1 Apligraf applied  02/23/2022 #2 Apligraf applied 03/02/2022 #3 Apligraf applied 03/16/2022 #4 Apligraf applied 03/30/2022 HOLD APLIGRAF this week. Cellular or Tissue Based Product applied to wound bed, secured with steri-strips, cover with Adaptic or Mepitel. (DO NOT REMOVE). Bathing/ Shower/ Hygiene: May shower with protection but do not get wound dressing(s) wet. Protect dressing(s) with water repellant cover (for example, large plastic bag) or a cast cover and may then take shower. Edema Control - Lymphedema / SCD / Other: Elevate legs to the level of the heart or above for 30 minutes daily and/or when sitting for 3-4 times a day throughout the day. Avoid standing for long periods of time. Patient to wear own compression stockings every day. - apply in the morning and remove at night left leg. Exercise regularly Moisturize legs daily. - apply every night before bed. Compression stocking or Garment 30-40 mm/Hg pressure to: - wear your VIVE compression stocking to left leg. apply in the morning and remove at night. WOUND #7: - Malleolus Wound Laterality: Right, Medial Cleanser: Soap and Water 1 x Per Day/30 Days Discharge Instructions: May shower and wash wound with dial antibacterial soap and water prior to dressing change. Cleanser: Vashe 5.8 (oz) 1 x Per Day/30 Days Discharge Instructions: Cleanse the wound with Vashe prior to applying a clean dressing using gauze sponges, not tissue or cotton balls. Peri-Wound Care: Skin Prep (Generic) 1 x Per Day/30 Days Discharge Instructions: Use skin prep as directed Prim Dressing: Hydrofera Blue Ready Transfer Foam, 4x5 (in/in) (Generic) 1 x Per Day/30 Days ary Discharge Instructions: Apply to wound bed as instructed Secondary Dressing: Zetuvit Plus Silicone Border Dressing 4x4 (in/in) (Generic) 1 x Per Day/30 Days Discharge Instructions: Apply silicone border over primary dressing as directed. Com pression Wrap: Medigrip Elasticated Tubular Support Bandage, Size [name], 3  (in) (Generic) 1 x Per Day/30 Days Discharge Instructions: apply in the morning and remove at night. 1. I would recommend that we have the patient continue to monitor for any evidence of infection or worsening. Overall I do think that the patient is making excellent progress and very pleased in this regard. 2. I am good recommend that the patient should continue with the [facility] he is good to be going to [place] and I do think that he should have a good plan at this point for that. Overall I think that we are still going to see good improvements with regard to his wounds in the interim because he has good results noted over the past week with doing the same thing he is going be doing while he is there. We will see patient  back for reevaluation in 1 week here in the clinic. If anything worsens or changes patient will contact our office for additional recommendations. Electronic Signature(s) Signed: 07/27/2022 3:35:15 PM By: [name] Entered By: [name] Derry on 07/27/2022 15:35:14 -------------------------------------------------------------------------------- SuperBill Details Patient Name: Date of Service: Zaccone, [name]. 07/27/2022 Medical Record Number: [id number] Patient Account Number: [id number] Date of Birth/Sex: Treating RN: May 16, 1955 (67 y.o. [name] Primary Care Provider: Martha [name] Other Clinician: Referring Provider: Treating Provider/Extender: [name] in Treatment: 51 Diagnosis Coding ICD-10 Codes Code Description I89.0 Lymphedema, not elsewhere classified GWEN, EDLER ([id number]) [id number] Page 11 of 11 I87.333 Chronic venous hypertension (idiopathic) with ulcer and inflammation of bilateral lower extremity L97.828 Non-pressure chronic ulcer of other part of left lower leg with other specified severity L97.818 Non-pressure chronic ulcer of other part of right lower leg with other specified  severity E11.621 Type 2 diabetes mellitus with foot ulcer L97.518 Non-pressure chronic ulcer of other part of right foot with other specified severity Facility Procedures : 7 CPT4 Code: 0272536 Description: 99213 - WOUND CARE VISIT-LEV 3 EST PT Modifier: Quantity: 1 Physician Procedures : CPT4 Code Description Modifier [id number] 99213 - WC PHYS LEVEL 3 - EST PT ICD-10 Diagnosis Description I89.0 Lymphedema, not elsewhere classified I87.333 Chronic venous hypertension (idiopathic) with ulcer and inflammation of bilateral lower extremity  L97.828 Non-pressure chronic ulcer of other part of left lower leg with other specified severity L97.818 Non-pressure chronic ulcer of other part of right lower leg with other specified severity Quantity: 1 Electronic Signature(s) Signed: 07/27/2022 3:35:43 PM By: [name] Entered By: [name] Derry on 07/27/2022 15:35:43

## 2022-07-29 ENCOUNTER — Other Ambulatory Visit (HOSPITAL_COMMUNITY): Payer: Self-pay

## 2022-07-29 NOTE — Progress Notes (Signed)
JAMION, CARTER ([id number]) [id number] Page 1 of 7 Visit Report for 07/27/2022 Arrival Information Details Patient Name: Date of Service: [place], [place] 07/27/2022 3:00 PM Medical Record Number: [id number] Patient Account Number: [id number] Date of Birth/Sex: Treating RN: Dec 24, 1955 (67 y.o. M) Primary Care Dewanna Hurston: [name] Other Clinician: Referring Kenidi Elenbaas: Treating Trinitey Roache/Extender: Alver Fisher in Treatment: 51 Visit Information History Since Last Visit Added or deleted any medications: No Patient Arrived: [name] Any new allergies or adverse reactions: No Arrival Time: 14:39 Had a fall or experienced change in No Accompanied By: self activities of daily living that may affect Transfer Assistance: None risk of falls: Patient Identification Verified: Yes Signs or symptoms of abuse/neglect since last visito No Secondary Verification Process Completed: Yes Hospitalized since last visit: No Patient Requires Transmission-Based Precautions: No Implantable device outside of the clinic excluding No Patient Has Alerts: Yes cellular tissue based products placed in the center Patient Alerts: Patient on Blood Thinner since last visit: 01/2021 ABI L 1.13 R 1.06 Has Dressing in Place as Prescribed: Yes 01/2021 TBI L amp R 0.8 Has Compression in Place as Prescribed: Yes Pain Present Now: No Electronic Signature(s) Signed: 07/28/2022 4:50:33 PM By: Thayer Dallas Entered By: Thayer Dallas on 07/27/2022 14:55:39 -------------------------------------------------------------------------------- Clinic Level of Care Assessment Details Patient Name: Date of Service: TIRAS, BIANCHINI D. 07/27/2022 3:00 PM Medical Record Number: [id number] Patient Account Number: [id number] Date of Birth/Sex: Treating RN: 02-May-1955 (67 y.o. [name] Primary Care Sheelah Ritacco: [name] Other Clinician: Referring Kippy Melena: Treating Hercules Hasler/Extender:  Alver Fisher in Treatment: 51 Clinic Level of Care Assessment Items TOOL 4 Quantity Score X- 1 0 Use when only an EandM is performed on FOLLOW-UP visit ASSESSMENTS - Nursing Assessment / Reassessment X- 1 10 Reassessment of Co-morbidities (includes updates in patient status) X- 1 5 Reassessment of Adherence to Treatment Plan ASSESSMENTS - Wound and Skin A ssessment / Reassessment X - Simple Wound Assessment / Reassessment - one wound 1 5 []  - 0 Complex Wound Assessment / Reassessment - multiple wounds []  - 0 Dermatologic / Skin Assessment (not related to wound area) ASSESSMENTS - Focused Assessment X- 1 5 Circumferential Edema Measurements - multi extremities []  - 0 Nutritional Assessment / Counseling / Intervention ALCEE, SIPOS D ([id number]) [id number] Page 2 of 7 []  - 0 Lower Extremity Assessment (monofilament, tuning fork, pulses) []  - 0 Peripheral Arterial Disease Assessment (using hand held doppler) ASSESSMENTS - Ostomy and/or Continence Assessment and Care []  - 0 Incontinence Assessment and Management []  - 0 Ostomy Care Assessment and Management (repouching, etc.) PROCESS - Coordination of Care X - Simple Patient / Family Education for ongoing care 1 15 []  - 0 Complex (extensive) Patient / Family Education for ongoing care X- 1 10 Staff obtains Chiropractor, Records, T Results / Process Orders est []  - 0 Staff telephones HHA, Nursing Homes / Clarify orders / etc []  - 0 Routine Transfer to another Facility (non-emergent condition) []  - 0 Routine Hospital Admission (non-emergent condition) []  - 0 New Admissions / Manufacturing engineer / Ordering NPWT Apligraf, etc. , []  - 0 Emergency Hospital Admission (emergent condition) X- 1 10 Simple Discharge Coordination []  - 0 Complex (extensive) Discharge Coordination PROCESS - Special Needs []  - 0 Pediatric / Minor Patient Management []  - 0 Isolation Patient  Management []  - 0 Hearing / Language / Visual special needs []  - 0 Assessment of Community assistance (transportation, D/C planning, etc.) []  - 0 Additional assistance /  Altered mentation []  - 0 Support Surface(s) Assessment (bed, cushion, seat, etc.) INTERVENTIONS - Wound Cleansing / Measurement X - Simple Wound Cleansing - one wound 1 5 []  - 0 Complex Wound Cleansing - multiple wounds X- 1 5 Wound Imaging (photographs - any number of wounds) []  - 0 Wound Tracing (instead of photographs) X- 1 5 Simple Wound Measurement - one wound []  - 0 Complex Wound Measurement - multiple wounds INTERVENTIONS - Wound Dressings X - Small Wound Dressing one or multiple wounds 1 10 []  - 0 Medium Wound Dressing one or multiple wounds []  - 0 Large Wound Dressing one or multiple wounds []  - 0 Application of Medications - topical []  - 0 Application of Medications - injection INTERVENTIONS - Miscellaneous []  - 0 External ear exam []  - 0 Specimen Collection (cultures, biopsies, blood, body fluids, etc.) []  - 0 Specimen(s) / Culture(s) sent or taken to Lab for analysis []  - 0 Patient Transfer (multiple staff / Nurse, adult / Similar devices) []  - 0 Simple Staple / Suture removal (25 or less) []  - 0 Complex Staple / Suture removal (26 or more) []  - 0 Hypo / Hyperglycemic Management (close monitor of Blood Glucose) Snelson, Obbie D ([id number]) [id number] Page 3 of 7 []  - 0 Ankle / Brachial Index (ABI) - do not check if billed separately X- 1 5 Vital Signs Has the patient been seen at the hospital within the last three years: Yes Total Score: 90 Level Of Care: New/Established - Level 3 Electronic Signature(s) Signed: 07/27/2022 5:02:36 PM By: Shawn Stall RN, BSN Entered By: Shawn Stall on 07/27/2022 15:27:07 -------------------------------------------------------------------------------- Encounter Discharge Information Details Patient Name: Date of  Service: Staton, [name] 07/27/2022 3:00 PM Medical Record Number: [id number] Patient Account Number: [id number] Date of Birth/Sex: Treating RN: 02/05/1955 (67 y.o. [name] Primary Care Gerardo Caiazzo: [name] Other Clinician: Referring Ori Trejos: Treating Curley Hogen/Extender: Alver Fisher in Treatment: [phone] Encounter Discharge Information Items Discharge Condition: Stable Ambulatory Status: Walker Discharge Destination: Home Transportation: Private Auto Accompanied By: self Schedule Follow-up Appointment: Yes Clinical Summary of Care: Electronic Signature(s) Signed: 07/27/2022 5:02:36 PM By: Shawn Stall RN, BSN Entered By: Shawn Stall on 07/27/2022 15:27:56 -------------------------------------------------------------------------------- Lower Extremity Assessment Details Patient Name: Date of Service: Friley, [name] 07/27/2022 3:00 PM Medical Record Number: [id number] Patient Account Number: [id number] Date of Birth/Sex: Treating RN: Jul 21, 1955 (67 y.o. M) Primary Care Ramsey Guadamuz: [name] Other Clinician: Referring Aarib Pulido: Treating Kaleea Penner/Extender: Alver Fisher in Treatment: 51 Edema Assessment Assessed: [Left: No] [Right: No] Edema: [Left: N] [Right: o] Calf Left: Right: Point of Measurement: 31 cm From Medial Instep 40 cm Ankle Left: Right: Point of Measurement: 11 cm From Medial Instep 27 cm Vascular Assessment Left: [id number] Page 4 of 7Right:] Extremity colors, hair growth, and conditions: Extremity Color: [[id number] Page 4 of 7Normal] Hair Growth on Extremity: [phone].pdf Page 4 of 7No] Temperature of Extremity: [phone].pdf Page 4 of 7Warm] Capillary Refill: [id number] Page 4 of 7< 3 seconds] Dependent Rubor: [[id number] Page 4 of 7No  Yes] Electronic Signature(s) Signed: 07/28/2022 4:50:33 PM By: Thayer Dallas Entered By: Thayer Dallas on 07/27/2022 14:56:36 -------------------------------------------------------------------------------- Multi-Disciplinary Care Plan Details Patient Name: Date of Service: Pelzer, [name] 07/27/2022 3:00 PM Medical Record Number: [id number] Patient Account Number: [id number] Date of Birth/Sex: Treating RN: November 15, 1955 (67 y.o. [name] Primary Care Briseida Gittings: [name] Other Clinician: Referring Delonta Yohannes: Treating [place] Greb/Extender: Alver Fisher in Treatment: [phone] Multidisciplinary Care  Plan reviewed with physician Active Inactive Pain, Acute or Chronic Nursing Diagnoses: Pain, acute or chronic: actual or potential Potential alteration in comfort, pain Goals: Patient will verbalize adequate pain control and receive pain control interventions during procedures as needed Date Initiated: 08/04/2021 Target Resolution Date: 09/10/2022 Goal Status: Active Patient/caregiver will verbalize comfort level met Date Initiated: 08/04/2021 Date Inactivated: 07/27/2022 Target Resolution Date: 09/10/2022 Goal Status: Met Interventions: Encourage patient to take pain medications as prescribed Provide education on pain management Reposition patient for comfort Treatment Activities: Administer pain control measures as ordered : 08/04/2021 Notes: Electronic Signature(s) Signed: 07/27/2022 5:02:36 PM By: Shawn Stall RN, BSN Entered By: Shawn Stall on 07/27/2022 15:25:50 -------------------------------------------------------------------------------- Pain Assessment Details Patient Name: Date of Service: Luallen, [name] 07/27/2022 3:00 PM Walpole, [name] ([id number]) [id number] Page 5 of 7 Medical Record Number: [id number] Patient Account Number: [id number] Date of Birth/Sex: Treating RN: 11-11-1955 (67 y.o. M) Primary Care Million Maharaj: [name] Other Clinician: Referring Mikhaela Zaugg: Treating Pasco Marchitto/Extender: Alver Fisher in Treatment: 51 Active Problems Location of Pain Severity and Description of Pain Patient Has Paino No Site Locations Pain Management and Medication Current Pain Management: Electronic Signature(s) Signed: 07/28/2022 4:50:33 PM By: Thayer Dallas Entered By: Thayer Dallas on 07/27/2022 14:56:14 -------------------------------------------------------------------------------- Patient/Caregiver Education Details Patient Name: Date of Service: Bergeson, Bella Kennedy 7/17/2024andnbsp3:00 PM Medical Record Number: [id number] Patient Account Number: [id number] Date of Birth/Gender: Treating RN: September 02, 1955 (66 y.o. [name] Primary Care Physician: [name] Other Clinician: Referring Physician: Treating Physician/Extender: Alver Fisher in Treatment: 70 Education Assessment Education Provided To: Patient Education Topics Provided Wound/Skin Impairment: Handouts: Caring for Your Ulcer Methods: Explain/Verbal Responses: Reinforcements needed Electronic Signature(s) Signed: 07/27/2022 5:02:36 PM By: Shawn Stall RN, BSN Entered By: Shawn Stall on 07/27/2022 15:26:29 Capece, [name] ([id number]) [id number] Page 6 of 7 -------------------------------------------------------------------------------- Wound Assessment Details Patient Name: Date of Service: DOMIQUE, CLAPPER D. 07/27/2022 3:00 PM Medical Record Number: [id number] Patient Account Number: [id number] Date of Birth/Sex: Treating RN: 09-05-1955 (67 y.o. M) Primary Care Zyona Pettaway: [name] Other Clinician: Referring Bradon Fester: Treating Zita Ozimek/Extender: Alver Fisher in Treatment: 51 Wound Status Wound Number: 7 Primary Venous Leg Ulcer Etiology: Wound Location: Right, Medial Malleolus Secondary Lymphedema Wounding Event:  Blister Etiology: Date Acquired: 07/21/2021 Wound Open Weeks Of Treatment: 51 Status: Clustered Wound: No Comorbid Sleep Apnea, Arrhythmia, Congestive Heart Failure, History: Hypertension, Hypotension, Peripheral Arterial Disease, Peripheral Venous Disease, Type II Diabetes, Gout, Osteoarthritis Photos Wound Measurements Length: (cm) Width: (cm) Depth: (cm) Area: (cm) Volume: (cm) 3.8 % Reduction in Area: 90.6% 1.2 % Reduction in Volume: 97.3% 0.2 Epithelialization: Medium (34-66%) 3.581 Tunneling: No 0.716 Undermining: No Wound Description Classification: Full Thickness With Exposed Suppo Wound Margin: Thickened Exudate Amount: Medium Exudate Type: Serosanguineous Exudate Color: red, brown rt Structures Foul Odor After Cleansing: No Slough/Fibrino Yes Wound Bed Granulation Amount: Large (67-100%) Exposed Structure Granulation Quality: Red, Pink Fascia Exposed: No Necrotic Amount: Small (1-33%) Fat Layer (Subcutaneous Tissue) Exposed: Yes Tendon Exposed: No Muscle Exposed: No Joint Exposed: No Bone Exposed: No Periwound Skin Texture Texture Color No Abnormalities Noted: No No Abnormalities Noted: No Callus: No Atrophie Blanche: No Crepitus: No Cyanosis: No Excoriation: No Ecchymosis: No Induration: No Erythema: No Rash: No Hemosiderin Staining: Yes Scarring: Yes Mottled: No Pallor: No Moisture Rubor: No No Abnormalities Noted: No Dry / Scaly: No LYRIQ, JARCHOW D ([id number]) [id number] Page 7 of 7 Maceration: No Treatment Notes Wound #7 (Malleolus) Wound  Laterality: Right, Medial Cleanser Soap and Water Discharge Instruction: May shower and wash wound with dial antibacterial soap and water prior to dressing change. Vashe 5.8 (oz) Discharge Instruction: Cleanse the wound with Vashe prior to applying a clean dressing using gauze sponges, not tissue or cotton balls. Peri-Wound Care Skin Prep Discharge Instruction: Use skin  prep as directed Topical Primary Dressing Hydrofera Blue Ready Transfer Foam, 4x5 (in/in) Discharge Instruction: Apply to wound bed as instructed Secondary Dressing Zetuvit Plus Silicone Border Dressing 4x4 (in/in) Discharge Instruction: Apply silicone border over primary dressing as directed. Secured With Compression Wrap Medigrip Elasticated Tubular Support Bandage, Size D, 3 (in) Discharge Instruction: apply in the morning and remove at night. Compression Stockings Add-Ons Electronic Signature(s) Signed: 07/28/2022 4:50:33 PM By: Thayer Dallas Entered By: Thayer Dallas on 07/27/2022 15:00:51 -------------------------------------------------------------------------------- Vitals Details Patient Name: Date of Service: Michiels, [name] 07/27/2022 3:00 PM Medical Record Number: [id number] Patient Account Number: [id number] Date of Birth/Sex: Treating RN: [birth year] (67 y.o. M) Primary Care Angelyn Osterberg: [name] Other Clinician: Referring Tanga Gloor: Treating Seanmichael Salmons/Extender: Alver Fisher in Treatment: 51 Vital Signs Time Taken: 14:55 Temperature (F): 98.6 Height (in): 71 Pulse (bpm): 76 Weight (lbs): 350 Respiratory Rate (breaths/min): 18 Body Mass Index (BMI): 48.8 Blood Pressure (mmHg): 129/77 Reference Range: 80 - 120 mg / dl Electronic Signature(s) Signed: 07/28/2022 4:50:33 PM By: Thayer Dallas Entered By: Thayer Dallas on 07/27/2022 14:56:03

## 2022-07-29 NOTE — Telephone Encounter (Signed)
Advanced Heart Failure Patient Advocate Encounter  Called patient to inquire about LIS determination. He did get a denial in the mail. Will try to bring in to the clinic when able.  Will fax to [facility] once received.   Archer Asa, CPhT

## 2022-08-03 ENCOUNTER — Ambulatory Visit (HOSPITAL_BASED_OUTPATIENT_CLINIC_OR_DEPARTMENT_OTHER): Payer: Medicare Other | Admitting: Internal Medicine

## 2022-08-03 NOTE — Progress Notes (Signed)
[name] [name] ([id number]) [id number] Page 1 of 5 Visit Report for 03/02/2022 Arrival Information Details Patient Name: Date of Service: [name] [name] 03/02/2022 3:00 PM Medical Record Number: [id number] Patient Account Number: [id number] Date of Birth/Sex: Treating RN: 12/22/1955 (67 y.o. M) Primary Care Rylie Limburg: [name] Other Clinician: Referring Jesstin Studstill: Treating Lorance Pickeral/Extender: Alver Fisher in Treatment: 30 Visit Information History Since Last Visit Added or deleted any medications: No Patient Arrived: [name] Any new allergies or adverse reactions: No Arrival Time: 14:53 Had a fall or experienced change in No Accompanied By: self activities of daily living that may affect Transfer Assistance: None risk of falls: Patient Identification Verified: Yes Signs or symptoms of abuse/neglect since last visito No Secondary Verification Process Completed: Yes Hospitalized since last visit: No Patient Requires Transmission-Based Precautions: No Implantable device outside of the clinic excluding No Patient Has Alerts: Yes cellular tissue based products placed in the center Patient Alerts: Patient on Blood Thinner since last visit: 01/2021 ABI L 1.13 R 1.06 Has Compression in Place as Prescribed: Yes 01/2021 TBI L amp R 0.8 Pain Present Now: No Electronic Signature(s) Signed: 03/02/2022 4:31:17 PM By: Thayer Dallas Entered By: Thayer Dallas on 03/02/2022 14:55:57 -------------------------------------------------------------------------------- Compression Therapy Details Patient Name: Date of Service: Gebhard, [name] 03/02/2022 3:00 PM Medical Record Number: [id number] Patient Account Number: [id number] Date of Birth/Sex: Treating RN: Jul 14, 1955 (67 y.o. [name] Primary Care Juanangel [name]: [name] Other Clinician: Referring Dinesha Twiggs: Treating Mykell Genao/Extender: Alver Fisher in Treatment:  30 Compression Therapy Performed for Wound Assessment: Wound #7 Right,Medial Malleolus Performed By: Clinician Shawn Stall, RN Compression Type: Three Layer Post Procedure Diagnosis Same as Pre-procedure Electronic Signature(s) Signed: 03/02/2022 5:20:51 PM By: Shawn Stall RN, BSN Entered By: Shawn Stall on 03/02/2022 15:35:24 Moulin, [name] ([id number]) [id number] Page 2 of 5 -------------------------------------------------------------------------------- Encounter Discharge Information Details Patient Name: Date of Service: [name] [name] 03/02/2022 3:00 PM Medical Record Number: [id number] Patient Account Number: [id number] Date of Birth/Sex: Treating RN: [birth year] (67 y.o. [name] Primary Care Korrine Sicard: [name] Other Clinician: Referring Admiral Marcucci: Treating Breanah Faddis/Extender: Alver Fisher in Treatment: 30 Encounter Discharge Information Items Post Procedure Vitals Discharge Condition: Stable Temperature (F): 97.6 Ambulatory Status: Walker Pulse (bpm): 92 Discharge Destination: Home Respiratory Rate (breaths/min): 18 Transportation: Private Auto Blood Pressure (mmHg): 140/87 Accompanied By: friend Schedule Follow-up Appointment: Yes Clinical Summary of Care: Electronic Signature(s) Signed: 03/02/2022 5:20:51 PM By: Shawn Stall RN, BSN Entered By: Shawn Stall on 03/02/2022 16:28:52 -------------------------------------------------------------------------------- Lower Extremity Assessment Details Patient Name: Date of Service: Levi, [name] 03/02/2022 3:00 PM Medical Record Number: [id number] Patient Account Number: [id number] Date of Birth/Sex: Treating RN: 1955/09/09 (67 y.o. [name] Primary Care Montey Ebel: [name] Other Clinician: Referring Meleni Delahunt: Treating Mileydi Milsap/Extender: Alver Fisher in Treatment: 30 Edema Assessment Assessed: Kyra Searles: No] Franne Forts:  No] Edema: [Left: N] [Right: o] Calf Left: Right: Point of Measurement: 31 cm From Medial Instep 40 cm Ankle Left: Right: Point of Measurement: 11 cm From Medial Instep 26.5 cm Electronic Signature(s) Signed: 03/02/2022 5:20:51 PM By: Shawn Stall RN, BSN Entered By: Shawn Stall on 03/02/2022 15:29:22 -------------------------------------------------------------------------------- Multi-Disciplinary Care Plan Details Patient Name: Date of Service: Melkonian, [name] 03/02/2022 3:00 PM Medical Record Number: [id number] Patient Account Number: [id number] Date of Birth/Sex: Treating RN: 11/09/1955 (67 y.o. [name] Primary Care Tisheena Maguire: [name] Other Clinician: Referring Vignesh Willert: Treating Tabathia Knoche/Extender: Lenda Kelp  Donaciano Eva in Treatment: [address] [place] D ([id number]) [id number] Page 3 of 5 Multidisciplinary Care Plan reviewed with physician Active Inactive Pain, Acute or Chronic Nursing Diagnoses: Pain, acute or chronic: actual or potential Potential alteration in comfort, pain Goals: Patient will verbalize adequate pain control and receive pain control interventions during procedures as needed Date Initiated: 08/04/2021 Target Resolution Date: 03/11/2022 Goal Status: Active Patient/caregiver will verbalize comfort level met Date Initiated: 08/04/2021 Target Resolution Date: 03/11/2022 Goal Status: Active Interventions: Encourage patient to take pain medications as prescribed Provide education on pain management Reposition patient for comfort Treatment Activities: Administer pain control measures as ordered : 08/04/2021 Notes: Electronic Signature(s) Signed: 03/02/2022 5:20:51 PM By: Shawn Stall RN, BSN Entered By: Shawn Stall on 03/02/2022 16:25:28 -------------------------------------------------------------------------------- Pain Assessment Details Patient Name: Date of Service: [name], [name] 03/02/2022 3:00  PM Medical Record Number: [id number] Patient Account Number: [id number] Date of Birth/Sex: Treating RN: 04/30/1955 (67 y.o. M) Primary Care Katia Hannen: [name] Other Clinician: Referring Saray Capasso: Treating Virna Livengood/Extender: Alver Fisher in Treatment: 30 Active Problems Location of Pain Severity and Description of Pain Patient Has Paino No Site Locations Pain Management and Medication ISAACK, PREBLE ([id number]) [id number] Page 4 of 5 Current Pain Management: Electronic Signature(s) Signed: 03/02/2022 4:31:17 PM By: Thayer Dallas Entered By: Thayer Dallas on 03/02/2022 14:56:57 -------------------------------------------------------------------------------- Wound Assessment Details Patient Name: Date of Service: Hortman, [name] 03/02/2022 3:00 PM Medical Record Number: [id number] Patient Account Number: [id number] Date of Birth/Sex: Treating RN: [birth year] (67 y.o. M) Primary Care Jakia Kennebrew: [name] Other Clinician: Referring Yulanda Diggs: Treating Hoa Briggs/Extender: Alver Fisher in Treatment: 30 Wound Status Wound Number: 7 Primary Venous Leg Ulcer Etiology: Wound Location: Right, Medial Malleolus Secondary Lymphedema Wounding Event: Blister Etiology: Date Acquired: 07/21/2021 Wound Open Weeks Of Treatment: 30 Status: Clustered Wound: No Comorbid Sleep Apnea, Arrhythmia, Congestive Heart Failure, History: Hypertension, Hypotension, Peripheral Arterial Disease, Peripheral Venous Disease, Type II Diabetes, Gout, Osteoarthritis Photos Wound Measurements Length: (cm) 4.3 Width: (cm) 2.7 Depth: (cm) 0.2 Area: (cm) 9.118 Volume: (cm) 1.824 % Reduction in Area: 76.2% % Reduction in Volume: 93.2% Epithelialization: Small (1-33%) Wound Description Classification: Full Thickness With Exposed Suppo Wound Margin: Distinct, outline attached Exudate Amount: Medium Exudate Type:  Serosanguineous Exudate Color: red, brown rt Structures Foul Odor After Cleansing: No Slough/Fibrino No Wound Bed Granulation Amount: Large (67-100%) Granulation Quality: Red Necrotic Amount: Small (1-33%) Necrotic Quality: Adherent Slough Periwound Skin Texture Texture Color No Abnormalities Noted: No No Abnormalities Noted: No Callus: No Atrophie Blanche: No Crepitus: No Cyanosis: No ROMEO, ZIELINSKI D ([id number]) [id number] Page 5 of 5 Excoriation: No Ecchymosis: No Induration: No Erythema: No Rash: No Hemosiderin Staining: No Scarring: No Mottled: No Pallor: No Moisture Rubor: No No Abnormalities Noted: No Dry / Scaly: Yes Maceration: No Electronic Signature(s) Signed: 08/03/2022 2:24:26 PM By: Karl Ito Entered By: Karl Ito on 03/02/2022 15:09:24 -------------------------------------------------------------------------------- Vitals Details Patient Name: Date of Service: Vangorden, [name] 03/02/2022 3:00 PM Medical Record Number: [id number] Patient Account Number: [id number] Date of Birth/Sex: Treating RN: Dec 12, 1955 (67 y.o. M) Primary Care Urijah Arko: [name] Other Clinician: Referring Haylie Mccutcheon: Treating Omir Cooprider/Extender: Alver Fisher in Treatment: 30 Vital Signs Time Taken: 14:56 Temperature (F): 97.6 Height (in): 71 Pulse (bpm): 92 Weight (lbs): 350 Respiratory Rate (breaths/min): 18 Body Mass Index (BMI): 48.8 Blood Pressure (mmHg): 140/87 Reference Range: 80 - 120 mg / dl Electronic Signature(s) Signed: 03/02/2022 4:31:17 PM By: Thayer Dallas Entered  By: Thayer Dallas on 03/02/2022 14:56:32

## 2022-08-10 ENCOUNTER — Encounter (HOSPITAL_BASED_OUTPATIENT_CLINIC_OR_DEPARTMENT_OTHER): Payer: Medicare Other | Admitting: Physician Assistant

## 2022-08-10 DIAGNOSIS — M1990 Unspecified osteoarthritis, unspecified site: Secondary | ICD-10-CM | POA: Diagnosis not present

## 2022-08-10 DIAGNOSIS — Z6841 Body Mass Index (BMI) 40.0 and over, adult: Secondary | ICD-10-CM | POA: Diagnosis not present

## 2022-08-10 DIAGNOSIS — E11621 Type 2 diabetes mellitus with foot ulcer: Secondary | ICD-10-CM | POA: Diagnosis not present

## 2022-08-10 DIAGNOSIS — I87333 Chronic venous hypertension (idiopathic) with ulcer and inflammation of bilateral lower extremity: Secondary | ICD-10-CM | POA: Diagnosis not present

## 2022-08-10 DIAGNOSIS — L97828 Non-pressure chronic ulcer of other part of left lower leg with other specified severity: Secondary | ICD-10-CM | POA: Diagnosis not present

## 2022-08-10 DIAGNOSIS — I872 Venous insufficiency (chronic) (peripheral): Secondary | ICD-10-CM | POA: Diagnosis not present

## 2022-08-10 DIAGNOSIS — I110 Hypertensive heart disease with heart failure: Secondary | ICD-10-CM | POA: Diagnosis not present

## 2022-08-10 DIAGNOSIS — L97312 Non-pressure chronic ulcer of right ankle with fat layer exposed: Secondary | ICD-10-CM | POA: Diagnosis not present

## 2022-08-10 DIAGNOSIS — I890 Lymphedema, not elsewhere classified: Secondary | ICD-10-CM | POA: Diagnosis not present

## 2022-08-10 DIAGNOSIS — G4730 Sleep apnea, unspecified: Secondary | ICD-10-CM | POA: Diagnosis not present

## 2022-08-10 DIAGNOSIS — I509 Heart failure, unspecified: Secondary | ICD-10-CM | POA: Diagnosis not present

## 2022-08-10 DIAGNOSIS — L97518 Non-pressure chronic ulcer of other part of right foot with other specified severity: Secondary | ICD-10-CM | POA: Diagnosis not present

## 2022-08-10 DIAGNOSIS — L97818 Non-pressure chronic ulcer of other part of right lower leg with other specified severity: Secondary | ICD-10-CM | POA: Diagnosis not present

## 2022-08-10 DIAGNOSIS — Z950 Presence of cardiac pacemaker: Secondary | ICD-10-CM | POA: Diagnosis not present

## 2022-08-10 DIAGNOSIS — Z7901 Long term (current) use of anticoagulants: Secondary | ICD-10-CM | POA: Diagnosis not present

## 2022-08-10 DIAGNOSIS — I2510 Atherosclerotic heart disease of native coronary artery without angina pectoris: Secondary | ICD-10-CM | POA: Diagnosis not present

## 2022-08-10 DIAGNOSIS — E1151 Type 2 diabetes mellitus with diabetic peripheral angiopathy without gangrene: Secondary | ICD-10-CM | POA: Diagnosis not present

## 2022-08-10 DIAGNOSIS — M109 Gout, unspecified: Secondary | ICD-10-CM | POA: Diagnosis not present

## 2022-08-10 NOTE — Progress Notes (Signed)
BRISEN, GAFFEY ([id number]) [id number] Page 1 of 12 Visit Report for 08/10/2022 Chief Complaint Document Details Patient Name: Date of Service: [name], [name] 08/10/2022 3:00 PM Medical Record Number: [id number] Patient Account Number: [id number] Date of Birth/Sex: Treating RN: 05/03/1955 (67 y.o. M) Primary Care Provider: [name] Other Clinician: Referring Provider: Treating Provider/Extender: Alver Fisher in Treatment: 44 Information Obtained from: Patient Chief Complaint 08/04/2021; patient returns to clinic with bilateral leg wounds as well as areas on the right foot Electronic Signature(s) Signed: 08/10/2022 2:36:44 PM By: Allen Derry PA-C Entered By: Allen Derry on 08/10/2022 14:36:43 -------------------------------------------------------------------------------- Debridement Details Patient Name: Date of Service: [name], [name] 08/10/2022 3:00 PM Medical Record Number: [id number] Patient Account Number: [id number] Date of Birth/Sex: Treating RN: 03-08-1955 (67 y.o. Harlon Flor, Millard.Loa Primary Care Provider: [name] Other Clinician: Referring Provider: Treating Provider/Extender: Alver Fisher in Treatment: 53 Debridement Performed for Assessment: Wound #7 Right,Medial Malleolus Performed By: Physician Lenda Kelp, PA Debridement Type: Debridement Severity of Tissue Pre Debridement: Fat layer exposed Level of Consciousness (Pre-procedure): Awake and Alert Pre-procedure Verification/Time Out Yes - 15:10 Taken: Start Time: 15:11 Pain Control: Lidocaine 4% T opical Solution Percent of Wound Bed Debrided: 100% T Area Debrided (cm): otal 5.6 Tissue and other material debrided: Viable, Non-Viable, Callus, Slough, Subcutaneous, Skin: Dermis , Skin: Epidermis, Biofilm, Slough Level: Skin/Subcutaneous Tissue Debridement Description: Excisional Instrument: Curette Bleeding: Minimum Hemostasis  Achieved: Pressure End Time: 15:17 Procedural Pain: 0 Post Procedural Pain: 0 Response to Treatment: Procedure was tolerated well Level of Consciousness (Post- Awake and Alert procedure): Post Debridement Measurements of Total Wound Length: (cm) 4.2 Width: (cm) 1.7 Depth: (cm) 0.2 Volume: (cm) 1.122 Character of Wound/Ulcer Post Debridement: Improved TIMTOHY, STILLWAGON D ([id number]) [id number] Page 2 of 12 Severity of Tissue Post Debridement: Fat layer exposed Post Procedure Diagnosis Same as Pre-procedure Electronic Signature(s) Signed: 08/10/2022 4:54:39 PM By: Allen Derry PA-C Signed: 08/10/2022 5:15:20 PM By: Shawn Stall RN, BSN Entered By: Shawn Stall on 08/10/2022 15:17:28 -------------------------------------------------------------------------------- HPI Details Patient Name: Date of Service: [name], [name] 08/10/2022 3:00 PM Medical Record Number: [id number] Patient Account Number: [id number] Date of Birth/Sex: Treating RN: Aug 05, 1955 (67 y.o. M) Primary Care Provider: [name] Other Clinician: Referring Provider: Treating Provider/Extender: Alver Fisher in Treatment: 51 History of Present Illness HPI Description: ADMISSION 03/22/2021 This is a 67 year old man with a past medical history significant for diabetes type 2, congestive heart failure, peripheral arterial disease, morbid obesity, venous insufficiency, and coronary artery disease. He has been followed by Dr. Loreta Ave in podiatry, who performed a transmetatarsal amputation on the left foot in August 2022. He had issues healing that wound, but based upon Dr. Kenna Gilbert notes, ultimately the TMA wound healed. During his recovery from that surgery, however, ulcers opened up over the DIP joint of the right second and third toe. These have apparently closed and reopened multiple times. It sounds like one of the issues has been moisture accumulation and maceration of the  tissues causing them to reopen. At his last visit with Dr. Loreta Ave, on March 01, 2021, there continues to be problems with moisture and he was referred to wound care for further evaluation and management. He had a formal aortogram with runoff performed prior to his TMA. The findings are copied here: Patient has inline flow to both feet with no significant flow-limiting lesion that would be amenable to percutaneous or open revascularization. He does have an  element of small vessel disease and has a short segment occlusion of the distal anterior tibial/dorsalis pedis artery on the left foot but does have posterior tibial artery flow. Would recommend management of wounds with amputation of toes 2 and 3 on the right foot if the wounds do not heal and deteriorate. Transmetatarsal amputation on the left side has as good a blood supply as it is going to get and hopefully this will heal in the future. Formal ABIs were done in January 2023. They are normal bilaterally. ABI Findings: +---------+------------------+-----+----------+--------+ Right Rt Pressure (mmHg)IndexWaveform Comment  +---------+------------------+-----+----------+--------+ Brachial 160     +---------+------------------+-----+----------+--------+ PTA 192 1.06 monophasic  +---------+------------------+-----+----------+--------+ DP 159 0.88 monophasic  +---------+------------------+-----+----------+--------+ Great T oe145 0.80    +---------+------------------+-----+----------+--------+ +--------+------------------+-----+---------+-------+ Left Lt Pressure (mmHg)IndexWaveform Comment +--------+------------------+-----+---------+-------+ [id number]     +--------+------------------+-----+---------+-------+ PTA 204 1.13 triphasic  +--------+------------------+-----+---------+-------+ DP 194 1.07 biphasic    +--------+------------------+-----+---------+-------+ +-------+-----------+-----------+------------+------------+ ABI/TBIT oday's ABIT oday's TBIPrevious ABIPrevious TBI +-------+-----------+-----------+------------+------------+ Right 1.06 0.8 1.26 0.65  +-------+-----------+-----------+------------+------------+ Left 1.13 amputation 1.15 amputation  +-------+-----------+-----------+------------+------------+ Previous ABI on 08/06/20 at [facility] Pedal pressures falsely elevated due to medial calcification. JARRED, ALMADA ([id number]) [id number] Page 3 of 12 Summary: Right: Resting right ankle-brachial index is within normal range. The right toe-brachial index is normal. Left: Resting left ankle-brachial index is within normal range. READMISSION 08/04/2021 Mr. Carlyon is now a 67 year old man who I remember from this clinic many years ago I think he had a right lower extremity predominantly venous wound at the time. He was here for 1 visit in March of this year had wounds on his right second and third toes we apparently dressed them many and they healed so he did not come back. He is listed in [place] is being a diabetic although the patient denies this says he is verified it with his primary doctor. In any case over the last several weeks or so according the patient although these wounds look somewhat more chronic than that he has developed predominantly large wounds on the right medial and right lateral ankle smaller areas on the left leg and areas on the dorsal aspect of his right second and third toes. Its not clear how he has been dressing these. 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 The patient has known PAD. He had an angiogram in August 2022 at which time he had nonhealing wounds in both feet. On the left his major vessels in the thigh were all patent. He  had three-vessel patent to the level of the ankle. He had a very short occlusion in the left anterior tibial. On the right lower extremity the major vessels in his thigh were all patent. He had three-vessel runoff to the foot sluggish filling of the anterior tibial artery. He was felt to have some component of small vessel disease but nothing that was amenable or needed revascularization. It was recommended that he have amputation of the second and third toes on the right foot if they did not heal He has been following with Dr. Loreta Ave of podiatry. Dr. Loreta Ave got him juxta lite stockings although I do not think he had them on properly he has uncontrolled edema in both legs Past medical history includes type 2 diabetes [although the patient really denies this], left TMA in 2022,lower extremity wounds in fact attendance at this clinic in 2009-2010, A-fib on Eliquis, chronic venous insufficiency with secondary lymphedema history of non-Hodgkin's lymphoma. 08-11-2021  upon evaluation today patient presents for follow-up evaluation he was seen last Wednesday initially for inspection here in our clinic. With that being said he tells me that he unfortunately has been having a lot of drainage and is actually coming through his wrap. Fortunately I do not see any evidence of active infection locally or systemically at this time which is great news. No fevers, chills, nausea, vomiting, or diarrhea. With that being said there does appear to be some evidence of local infection based on what I am seeing today. 08-18-2021 upon evaluation today patient appears to be doing okay currently in regard to his wounds with that being said that he is doing much better but still has a long ways to go to get to where he wanted to be. I think the infection is significantly improved. He has another week of the antibiotic at this point. 08-25-2021 upon evaluation today patient's wounds are actually doing decently well he has erythema has  significantly improved. I think the cellulitis is under controlled I am going to continue him on 2 more weeks of the Levaquin at 500 mg this is a lower dose but I am hoping it will be better for him. 09-08-2021 upon evaluation today patient appears to be doing excellent in regard to his wounds. Since I last saw him he was actually in the hospital where he ended up having a pacemaker put in. Subsequently he tells me that he is actually doing quite well although they were unsure whether they were going to do it due to the fact that he had the wounds on his legs. And then I am glad they did anything seem to be doing well. 09-15-2021 upon evaluation patient's wounds are actually showing signs of improvement. The right side wounds do appear to have some need for sharp debridement today and I Ernie Hew go ahead and proceed with that. I think that if we get the wounds cleaned up he will actually show signs of continued improvement. I am also leaning towards switching to [facility] which I think will be a much better option for him. 09-22-2021 patient's wounds are showing signs of excellent improvement. I am actually extremely pleased with where we stand and I think that the patient is making great progress. There does not appear to be any signs of active infection. 09-29-2021 upon evaluation today patient appears to be doing excellent in regard to his wounds. He is actually tolerating the dressing changes without complication. Fortunately I see no evidence of active infection locally or systemically at this time which is great news and overall I am extremely pleased with where we stand currently. [id number] upon evaluation today patient actually appears to be doing excellent in regard to his wounds. The left leg is almost completely healed the right ankle is significantly smaller. Overall I am extremely pleased with where we stand at this point. I do not see any evidence of active infection at this time. 10-13-2021  upon evaluation today patient appears to be doing excellent in regard to his wounds. I really feel like he is making good progress here and I am very pleased in that regard. Fortunately I do not see any signs of active infection at this time. We are using the [facility] topical antibiotic therapy. 10-20-2021 upon evaluation today patient appears to be doing well with regard to his wound on the right medial ankle region the left leg is almost completely healed. I am actually very pleased with where we stand today. 11-03-2021  upon evaluation today patient's wound actually is going require some sharp debridement but appears to be doing much better which is great news. Fortunately I do not see any signs of active infection at this time. 11-10-2021 upon evaluation today patient's wound is actually showing signs of excellent improvement. Fortunately I do not see any evidence of infection locally or systemically which is great news and overall I am extremely pleased with where we stand today. I do believe he is making good progress he does have his [facility] topical antibiotics with him here today. 11-24-2021 upon evaluation today patient actually showing signs of excellent improvement this appears to be doing much better. Fortunately I do not see any evidence of infection locally or systemically at this time. 12-01-2021 upon evaluation today patient appears to be doing well currently in regard to his wound. He has been tolerating the dressing changes without complication. Fortunately I do not see any evidence of active infection at this time which is great news and overall I am extremely pleased with where we stand today. 12-15-2021 upon evaluation today patient appears to be doing well with regard to his wound. He is showing signs of improvement is slow but nonetheless we are making improvements here. Fortunately I do not see any evidence of active infection locally nor systemically at this time. We are still  using the [facility] topical antibiotic over the open area only. 12-29-2021 upon evaluation today patient's wound actually is showing signs of excellent improvement. It has been 2 weeks since I perform any debridement and it definitely shows he has some tissue that needs to be cleaned away but I think we can do so quite easily and readily today. The good news is I do not see any signs of infection I think he is doing much better in that regard. Overall I am extremely pleased with where we stand. 01-12-2022 upon evaluation today patient appears to be doing well currently in regard to his wound although it is not getting significantly smaller it is also not getting any larger. Fortunately I do not see any evidence of infection locally nor systemically which is great news and overall I am extremely pleased with where things stand currently. 01-19-2022 upon evaluation today patient appears to be doing well currently in regard to his wound. The PolyMem actually seems to have done extremely well RAYMEL, VANDONGEN D ([id number]) [id number] Page 4 of 12 for him. Fortunately I do not see any signs of infection locally nor systemically at this time. 01-26-2022 upon evaluation today patient appears to be making progress. Fortunately there does not appear to be any signs of infection which is great news. No fevers, chills, nausea, vomiting, or diarrhea. With that being said this is very slow to heal and although it is smaller I still feel like we may want to try to do something to speed this up I think that a skin substitute could be beneficial, look into Kerecis. 02-02-2022 upon evaluation today patient's wound is actually showing signs of excellent improvement. I do not see any evidence of infection and overall I think that we are headed in the right direction. Fortunately I think that he is tolerating the dressing changes without complication. 02-09-2022 upon evaluation today patient appears to  be doing well currently in regard to his wound. It does look a little bit macerated but fortunately does not appear to be showing any signs of significant skin breakdown over the macerated area which is good news. Fortunately I do not see  any evidence of active infection locally nor systemically at this point which is great news. 02-23-2022 upon evaluation today patient appears to be doing well currently in regard to his wounds. In fact his area on the ankle is actually showing signs of healing quite nicely. We do have the Apligraf ready today I am hopeful this is going to speed things up and get this closed much more effectively and quickly. Fortunately I do not see any signs of active infection locally nor systemically at this time which is great news. 03-02-2022 upon evaluation today patient appears to be doing excellent in regard to his wound. He is actually been tolerating the dressing changes without complication the wound on the left medial lower extremity is doing quite well and Apligraf seems to have been extremely beneficial for him. I am extremely pleased with where we stand today. 03/16/2022: The wound measurements are smaller today. He has accumulation of a yellow crust around the edges and slough on the surface. There is a musty odor coming from the wound. He has been getting Apligraf. 03-30-2022 upon evaluation today patient appears to be making progress. In regard to his leg ulcer. This is slow but nonetheless the Apligraf has fed things up. Fortunately I do not see any evidence of infection locally nor systemically at this time. No fevers, chills, nausea, vomiting, or diarrhea. Patient is here for Apligraf #4 today. 04-13-2022 upon evaluation today patient appears to be doing a little bit more poorly in regard to his wound. He actually feels like his wrap may have been a little bit tight. Fortunately there does not appear to be any signs of active infection locally nor systemically which is  great news and I am pleased in that regard. 04-20-2022 upon evaluation today patient appears to be doing better in regard to his wound from the standpoint of swelling he is actually lost 33 pounds on torsemide in the past week his leg is significantly smaller compared to what it has been. Fortunately I do not see any signs of active infection locally nor systemically at this time. 04-27-2022 upon evaluation today patient's wound actually is showing signs of erythema and warmth around the edges of the wound I am actually concerned about the possibility of infection. I think we probably need to obtain a wound culture and also can recommend based on what we are seeing that we go ahead and have the patient continue to utilize the compression wrapping which I think has been of benefit. 05-04-2022 upon evaluation today patient appears to be doing well currently in regard to his wound. He has been tolerating the dressing changes without complication. Fortunately there does not appear to be any signs of active infection locally or systemically which is great news. 05-11-2022 upon evaluation today patient actually appears to be doing excellent. He has been tolerating the dressing changes we actually were trialing a new medication on him last week and this was the ergo clean Ag dressing. With that being said this actually seems to have done extremely well at this point. Fortunately I do not see any signs of active infection locally or systemically at this time. 05-18-2022 upon evaluation today patient appears to be doing okay in regard to his wound I do not see any signs of worsening also notes any signs of significant improvement. Fortunately I do believe that removing in the right direction in general. 05-25-22 upon evaluation today patient appears to be doing well currently in regard to his wound. He has  been tolerating the dressing changes without complication. Fortunately I do not see any signs of active infection  locally nor systemically which is great news and overall I am extremely pleased with where we stand currently. There does not appear to be any signs of active infection locally nor systemically which is great news. No fevers, chills, nausea, vomiting, or diarrhea. 06-01-2022 upon evaluation today patient actually appears to be making excellent progress in regard to his ankle region. I am very pleased with where we stand I think that he is doing extremely well and very pleased with endoform and drawtex combination. 06-08-2022 upon evaluation today patient appears to be doing well currently in regard to his wound. He has been making good progress and I feel like practicing improvement in the overall size of the wound. I am very pleased in that regard. Fortunately I do not see any signs of active infection locally nor systemically which is great news. No fevers, chills, nausea, vomiting, or diarrhea. 06-15-2022 upon evaluation today patient appears to be doing well currently in regard to his wound. He has been tolerating the dressing changes without complication. I do believe the endoform has been extremely helpful for him up to this point. I do not see any signs of active infection locally nor systemically which is great news and in general I do believe that we are making pretty good progress here which is excellent news. 06-22-2022 upon evaluation today patient appears to be doing well currently in regard to his wound. He has been tolerating the dressing changes without complication. Fortunately there does not appear to be any signs of active infection at this time which is great news. I think that he is actually taking good progress with the endoform which is excellent as well. 06-29-2022 upon evaluation patient appears to be doing well currently in regard to his wound. He is actually making good progress towards closure and I am very pleased in that regard. I do not see any evidence of active infection  locally nor systemically which is great news and in general I do believe that we are making excellent progress here overall. 07-06-2022 upon evaluation today patient appears to be doing well currently in regard to his wound. He has been tolerating the dressing changes without complication. The good news is he is slowly getting smaller each time I see him. 07-13-2022 upon evaluation today patient appears to be doing well currently in regard to his wound is slowly showing signs of improvement. Fortunately I do not see any signs of infection at this time. No fevers, chills, nausea, vomiting, or diarrhea. 07-20-2022 upon evaluation today patient appears to be doing well currently in regard to his wound. He has been tolerating the dressing changes without complication. Fortunately I do not see any evidence of active infection locally or systemically which is great news and in general I do believe that we are making headway towards complete closure. This is excellent news. With that being said we are preparing for him to be leaving to go out of town in about a week and a half. For that reason we will going to make a few changes to his dressing today in order to give him something that I think will be easier for him to do when he is out of town. 07-27-2022 upon evaluation today patient appears to be doing well currently in regard to his wound. He has been tolerating the dressing changes without complication. Fortunately there does not appear to be any  signs of infection. Fortunately there does not appear to be any signs of infection 08-10-2022 upon evaluation today patient appears to be doing okay in regard to his wound. He actually has been gone for 2 weeks and in the interim unfortunately he did not have result out of the Tegaderm as well as hoping for but nonetheless took good care of his wound. Does not appear to be infected I do think we need to go back to the compression wrap however which I think would  definitely benefit him. Fortunately I do not see any signs of active infection at this time which is great news. LEANGELO, HARTL ([id number]) [id number] Page 5 of 12 Electronic Signature(s) Signed: 08/10/2022 3:37:18 PM By: Allen Derry PA-C Entered By: Allen Derry on 08/10/2022 15:37:18 -------------------------------------------------------------------------------- Physical Exam Details Patient Name: Date of Service: [place], [name] 08/10/2022 3:00 PM Medical Record Number: [id number] Patient Account Number: [id number] Date of Birth/Sex: Treating RN: 07-14-1955 (67 y.o. M) Primary Care Provider: [name] Other Clinician: Referring Provider: Treating Provider/Extender: Alver Fisher in Treatment: 74 Constitutional Well-nourished and well-hydrated in no acute distress. Respiratory normal breathing without difficulty. Psychiatric this patient is able to make decisions and demonstrates good insight into disease process. Alert and Oriented x 3. pleasant and cooperative. Notes Upon inspection patient's wound bed actually showed signs of need for sharp debridement I did perform debridement today clearway necrotic debris patient tolerated this today without complication postdebridement wound bed is significantly improved which is great news. Electronic Signature(s) Signed: 08/10/2022 3:37:42 PM By: Allen Derry PA-C Entered By: Allen Derry on 08/10/2022 15:37:42 -------------------------------------------------------------------------------- Physician Orders Details Patient Name: Date of Service: Bronder, [name] 08/10/2022 3:00 PM Medical Record Number: [id number] Patient Account Number: [id number] Date of Birth/Sex: Treating RN: August 21, 1955 (67 y.o. Harlon Flor, Millard.Loa Primary Care Provider: [name] Other Clinician: Referring Provider: Treating Provider/Extender: Alver Fisher in Treatment: 30 Verbal / Phone  Orders: No Diagnosis Coding ICD-10 Coding Code Description I89.0 Lymphedema, not elsewhere classified I87.333 Chronic venous hypertension (idiopathic) with ulcer and inflammation of bilateral lower extremity L97.828 Non-pressure chronic ulcer of other part of left lower leg with other specified severity L97.818 Non-pressure chronic ulcer of other part of right lower leg with other specified severity E11.621 Type 2 diabetes mellitus with foot ulcer L97.518 Non-pressure chronic ulcer of other part of right foot with other specified severity Follow-up Appointments ppointment in 1 week. Leonard Schwartz Wednesday Return A ppointment in 2 weeks. Leonard Schwartz Wednesday Return A Return appointment in 3 weeks. Leonard Schwartz Wednesday XAVIEN, HOENE D ([id number]) [id number] Page 6 of 12 Anesthetic (In clinic) Topical Lidocaine 5% applied to wound bed - Used in Clinic Prior to debridement Cellular or Tissue Based Products Cellular or Tissue Based Product Type: - Run IVR for Kerrecis- denied #1 Apligraf applied 02/23/2022 #2 Apligraf applied 03/02/2022 #3 Apligraf applied 03/16/2022 #4 Apligraf applied 03/30/2022 HOLD APLIGRAF this week. Cellular or Tissue Based Product applied to wound bed, secured with steri-strips, cover with Adaptic or Mepitel. (DO NOT REMOVE). Bathing/ Shower/ Hygiene May shower with protection but do not get wound dressing(s) wet. Protect dressing(s) with water repellant cover (for example, large plastic bag) or a cast cover and may then take shower. Edema Control - Lymphedema / SCD / Other Elevate legs to the level of the heart or above for 30 minutes daily and/or when sitting for 3-4 times a day throughout the day. Avoid standing for long periods of time.  Patient to wear own compression stockings every day. - apply in the morning and remove at night left leg. Exercise regularly Moisturize legs daily. - apply every night before bed. Compression stocking or Garment 30-40  mm/Hg pressure to: - wear your VIVE compression stocking to left leg. apply in the morning and remove at night. Wound Treatment Wound #7 - Malleolus Wound Laterality: Right, Medial Cleanser: Soap and Water 1 x Per Week/30 Days Discharge Instructions: May shower and wash wound with dial antibacterial soap and water prior to dressing change. Cleanser: Vashe 5.8 (oz) 1 x Per Week/30 Days Discharge Instructions: Cleanse the wound with Vashe prior to applying a clean dressing using gauze sponges, not tissue or cotton balls. Peri-Wound Care: Sween Lotion (Moisturizing lotion) 1 x Per Week/30 Days Discharge Instructions: Apply moisturizing lotion as directed Prim Dressing: Endoform 2x2 in 1 x Per Week/30 Days ary Discharge Instructions: Moisten with saline Secondary Dressing: Zetuvit Plus 4x8 in 1 x Per Week/30 Days Discharge Instructions: Apply over primary dressing as directed. Compression Wrap: Urgo K2, (equivalent to a 4 layer) two layer compression system, regular 1 x Per Week/30 Days Discharge Instructions: Apply Urgo K2 as directed (alternative to 4 layer compression). Electronic Signature(s) Signed: 08/10/2022 4:54:39 PM By: Allen Derry PA-C Signed: 08/10/2022 5:15:20 PM By: Shawn Stall RN, BSN Entered By: Shawn Stall on 08/10/2022 15:16:27 -------------------------------------------------------------------------------- Problem List Details Patient Name: Date of Service: Cammack, [name] 08/10/2022 3:00 PM Medical Record Number: [id number] Patient Account Number: [id number] Date of Birth/Sex: Treating RN: [birth year] (67 y.o. M) Primary Care Provider: [name] Other Clinician: Referring Provider: Treating Provider/Extender: Alver Fisher in Treatment: 86 Active Problems ICD-10 Encounter Code Description Active Date MDM Diagnosis I89.0 Lymphedema, not elsewhere classified 08/04/2021 No Yes PRECILIANO, DELACY D ([id number]) [phone].pdf  Page 7 of 12 [phone] Chronic venous hypertension (idiopathic) with ulcer and inflammation of 08/04/2021 No Yes bilateral lower extremity L97.828 Non-pressure chronic ulcer of other part of left lower leg with other specified 08/04/2021 No Yes severity L97.818 Non-pressure chronic ulcer of other part of right lower leg with other specified 08/04/2021 No Yes severity E11.621 Type 2 diabetes mellitus with foot ulcer 08/04/2021 No Yes L97.518 Non-pressure chronic ulcer of other part of right foot with other specified 08/04/2021 No Yes severity Inactive Problems Resolved Problems Electronic Signature(s) Signed: 08/10/2022 2:36:34 PM By: Allen Derry PA-C Entered By: Allen Derry on 08/10/2022 14:36:34 -------------------------------------------------------------------------------- Progress Note Details Patient Name: Date of Service: Giron, [name] 08/10/2022 3:00 PM Medical Record Number: [id number] Patient Account Number: [id number] Date of Birth/Sex: Treating RN: 01-08-1956 (67 y.o. M) Primary Care Provider: [name] Other Clinician: Referring Provider: Treating Provider/Extender: Alver Fisher in Treatment: 66 Subjective Chief Complaint Information obtained from Patient 08/04/2021; patient returns to clinic with bilateral leg wounds as well as areas on the right foot History of Present Illness (HPI) ADMISSION 03/22/2021 This is a 67 year old man with a past medical history significant for diabetes type 2, congestive heart failure, peripheral arterial disease, morbid obesity, venous insufficiency, and coronary artery disease. He has been followed by Dr. Loreta Ave in podiatry, who performed a transmetatarsal amputation on the left foot in August 2022. He had issues healing that wound, but based upon Dr. Kenna Gilbert notes, ultimately the TMA wound healed. During his recovery from that surgery, however, ulcers opened up over the DIP joint of the right second and third toe.  These have apparently closed and reopened multiple times. It sounds like one of  the issues has been moisture accumulation and maceration of the tissues causing them to reopen. At his last visit with Dr. Loreta Ave, on March 01, 2021, there continues to be problems with moisture and he was referred to wound care for further evaluation and management. He had a formal aortogram with runoff performed prior to his TMA. The findings are copied here: Patient has inline flow to both feet with no significant flow-limiting lesion that would be amenable to percutaneous or open revascularization. He does have an element of small vessel disease and has a short segment occlusion of the distal anterior tibial/dorsalis pedis artery on the left foot but does have posterior tibial artery flow. Would recommend management of wounds with amputation of toes 2 and 3 on the right foot if the wounds do not heal and deteriorate. Transmetatarsal amputation on the left side has as good a blood supply as it is going to get and hopefully this will heal in the future. Formal ABIs were done in January 2023. They are normal bilaterally. ABI Findings: +---------+------------------+-----+----------+--------+ Right Rt Pressure (mmHg)IndexWaveform Comment  +---------+------------------+-----+----------+--------+ SEBASTION, SAGE D ([id number]) [id number] Page 8 of 12 Brachial 160    +---------+------------------+-----+----------+--------+ PTA 192 1.06 monophasic  +---------+------------------+-----+----------+--------+ DP 159 0.88 monophasic  +---------+------------------+-----+----------+--------+ Great T oe145 0.80    +---------+------------------+-----+----------+--------+ +--------+------------------+-----+---------+-------+ Left Lt Pressure (mmHg)IndexWaveform Comment +--------+------------------+-----+---------+-------+ [id number]     +--------+------------------+-----+---------+-------+ PTA 204 1.13 triphasic  +--------+------------------+-----+---------+-------+ DP 194 1.07 biphasic   +--------+------------------+-----+---------+-------+ +-------+-----------+-----------+------------+------------+ ABI/TBIT oday's ABIT oday's TBIPrevious ABIPrevious TBI +-------+-----------+-----------+------------+------------+ Right 1.06 0.8 1.26 0.65  +-------+-----------+-----------+------------+------------+ Left 1.13 amputation 1.15 amputation  +-------+-----------+-----------+------------+------------+ Previous ABI on 08/06/20 at [facility] Pedal pressures falsely elevated due to medial calcification. Summary: Right: Resting right ankle-brachial index is within normal range. The right toe-brachial index is normal. Left: Resting left ankle-brachial index is within normal range. READMISSION 08/04/2021 Mr. Feliu is now a 67 year old man who I remember from this clinic many years ago I think he had a right lower extremity predominantly venous wound at the time. He was here for 1 visit in March of this year had wounds on his right second and third toes we apparently dressed them many and they healed so he did not come back. He is listed in [place] is being a diabetic although the patient denies this says he is verified it with his primary doctor. In any case over the last several weeks or so according the patient although these wounds look somewhat more chronic than that he has developed predominantly large wounds on the right medial and right lateral ankle smaller areas on the left leg and areas on the dorsal aspect of his right second and third toes. Its not clear how he has been dressing these. 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 The patient has known PAD. He had an angiogram in August 2022 at which time he had  nonhealing wounds in both feet. On the left his major vessels in the thigh were all patent. He had three-vessel patent to the level of the ankle. He had a very short occlusion in the left anterior tibial. On the right lower extremity the major vessels in his thigh were all patent. He had three-vessel runoff to the foot sluggish filling of the anterior tibial artery. He was felt to have some component of small vessel disease but nothing that was amenable or needed revascularization. It was recommended that he have amputation of the second and  third toes on the right foot if they did not heal He has been following with Dr. Loreta Ave of podiatry. Dr. Loreta Ave got him juxta lite stockings although I do not think he had them on properly he has uncontrolled edema in both legs Past medical history includes type 2 diabetes [although the patient really denies this], left TMA in 2022,lower extremity wounds in fact attendance at this clinic in 2009-2010, A-fib on Eliquis, chronic venous insufficiency with secondary lymphedema history of non-Hodgkin's lymphoma. 08-11-2021 upon evaluation today patient presents for follow-up evaluation he was seen last Wednesday initially for inspection here in our clinic. With that being said he tells me that he unfortunately has been having a lot of drainage and is actually coming through his wrap. Fortunately I do not see any evidence of active infection locally or systemically at this time which is great news. No fevers, chills, nausea, vomiting, or diarrhea. With that being said there does appear to be some evidence of local infection based on what I am seeing today. 08-18-2021 upon evaluation today patient appears to be doing okay currently in regard to his wounds with that being said that he is doing much better but still has a long ways to go to get to where he wanted to be. I think the infection is significantly improved. He has another week of the antibiotic at this  point. 08-25-2021 upon evaluation today patient's wounds are actually doing decently well he has erythema has significantly improved. I think the cellulitis is under controlled I am going to continue him on 2 more weeks of the Levaquin at 500 mg this is a lower dose but I am hoping it will be better for him. 09-08-2021 upon evaluation today patient appears to be doing excellent in regard to his wounds. Since I last saw him he was actually in the hospital where he ended up having a pacemaker put in. Subsequently he tells me that he is actually doing quite well although they were unsure whether they were going to do it due to the fact that he had the wounds on his legs. And then I am glad they did anything seem to be doing well. 09-15-2021 upon evaluation patient's wounds are actually showing signs of improvement. The right side wounds do appear to have some need for sharp debridement today and I Ernie Hew go ahead and proceed with that. I think that if we get the wounds cleaned up he will actually show signs of continued improvement. I am also leaning towards switching to [facility] which I think will be a much better option for him. 09-22-2021 patient's wounds are showing signs of excellent improvement. I am actually extremely pleased with where we stand and I think that the patient is making great progress. There does not appear to be any signs of active infection. 09-29-2021 upon evaluation today patient appears to be doing excellent in regard to his wounds. He is actually tolerating the dressing changes without complication. Fortunately I see no evidence of active infection locally or systemically at this time which is great news and overall I am extremely pleased with where we stand currently. [id number] upon evaluation today patient actually appears to be doing excellent in regard to his wounds. The left leg is almost completely healed the right ankle is significantly smaller. Overall I am extremely  pleased with where we stand at this point. I do not see any evidence of active infection at this time. DOVID, ZACCAGNINI ([id number]) [id number] Page 9 of  12 10-13-2021 upon evaluation today patient appears to be doing excellent in regard to his wounds. I really feel like he is making good progress here and I am very pleased in that regard. Fortunately I do not see any signs of active infection at this time. We are using the [facility] topical antibiotic therapy. 10-20-2021 upon evaluation today patient appears to be doing well with regard to his wound on the right medial ankle region the left leg is almost completely healed. I am actually very pleased with where we stand today. 11-03-2021 upon evaluation today patient's wound actually is going require some sharp debridement but appears to be doing much better which is great news. Fortunately I do not see any signs of active infection at this time. 11-10-2021 upon evaluation today patient's wound is actually showing signs of excellent improvement. Fortunately I do not see any evidence of infection locally or systemically which is great news and overall I am extremely pleased with where we stand today. I do believe he is making good progress he does have his [facility] topical antibiotics with him here today. 11-24-2021 upon evaluation today patient actually showing signs of excellent improvement this appears to be doing much better. Fortunately I do not see any evidence of infection locally or systemically at this time. 12-01-2021 upon evaluation today patient appears to be doing well currently in regard to his wound. He has been tolerating the dressing changes without complication. Fortunately I do not see any evidence of active infection at this time which is great news and overall I am extremely pleased with where we stand today. 12-15-2021 upon evaluation today patient appears to be doing well with regard to his wound. He is  showing signs of improvement is slow but nonetheless we are making improvements here. Fortunately I do not see any evidence of active infection locally nor systemically at this time. We are still using the [facility] topical antibiotic over the open area only. 12-29-2021 upon evaluation today patient's wound actually is showing signs of excellent improvement. It has been 2 weeks since I perform any debridement and it definitely shows he has some tissue that needs to be cleaned away but I think we can do so quite easily and readily today. The good news is I do not see any signs of infection I think he is doing much better in that regard. Overall I am extremely pleased with where we stand. 01-12-2022 upon evaluation today patient appears to be doing well currently in regard to his wound although it is not getting significantly smaller it is also not getting any larger. Fortunately I do not see any evidence of infection locally nor systemically which is great news and overall I am extremely pleased with where things stand currently. 01-19-2022 upon evaluation today patient appears to be doing well currently in regard to his wound. The PolyMem actually seems to have done extremely well for him. Fortunately I do not see any signs of infection locally nor systemically at this time. 01-26-2022 upon evaluation today patient appears to be making progress. Fortunately there does not appear to be any signs of infection which is great news. No fevers, chills, nausea, vomiting, or diarrhea. With that being said this is very slow to heal and although it is smaller I still feel like we may want to try to do something to speed this up I think that a skin substitute could be beneficial, look into Kerecis. 02-02-2022 upon evaluation today patient's wound is actually showing signs of  excellent improvement. I do not see any evidence of infection and overall I think that we are headed in the right direction. Fortunately I think  that he is tolerating the dressing changes without complication. 02-09-2022 upon evaluation today patient appears to be doing well currently in regard to his wound. It does look a little bit macerated but fortunately does not appear to be showing any signs of significant skin breakdown over the macerated area which is good news. Fortunately I do not see any evidence of active infection locally nor systemically at this point which is great news. 02-23-2022 upon evaluation today patient appears to be doing well currently in regard to his wounds. In fact his area on the ankle is actually showing signs of healing quite nicely. We do have the Apligraf ready today I am hopeful this is going to speed things up and get this closed much more effectively and quickly. Fortunately I do not see any signs of active infection locally nor systemically at this time which is great news. 03-02-2022 upon evaluation today patient appears to be doing excellent in regard to his wound. He is actually been tolerating the dressing changes without complication the wound on the left medial lower extremity is doing quite well and Apligraf seems to have been extremely beneficial for him. I am extremely pleased with where we stand today. 03/16/2022: The wound measurements are smaller today. He has accumulation of a yellow crust around the edges and slough on the surface. There is a musty odor coming from the wound. He has been getting Apligraf. 03-30-2022 upon evaluation today patient appears to be making progress. In regard to his leg ulcer. This is slow but nonetheless the Apligraf has fed things up. Fortunately I do not see any evidence of infection locally nor systemically at this time. No fevers, chills, nausea, vomiting, or diarrhea. Patient is here for Apligraf #4 today. 04-13-2022 upon evaluation today patient appears to be doing a little bit more poorly in regard to his wound. He actually feels like his wrap may have been a  little bit tight. Fortunately there does not appear to be any signs of active infection locally nor systemically which is great news and I am pleased in that regard. 04-20-2022 upon evaluation today patient appears to be doing better in regard to his wound from the standpoint of swelling he is actually lost 33 pounds on torsemide in the past week his leg is significantly smaller compared to what it has been. Fortunately I do not see any signs of active infection locally nor systemically at this time. 04-27-2022 upon evaluation today patient's wound actually is showing signs of erythema and warmth around the edges of the wound I am actually concerned about the possibility of infection. I think we probably need to obtain a wound culture and also can recommend based on what we are seeing that we go ahead and have the patient continue to utilize the compression wrapping which I think has been of benefit. 05-04-2022 upon evaluation today patient appears to be doing well currently in regard to his wound. He has been tolerating the dressing changes without complication. Fortunately there does not appear to be any signs of active infection locally or systemically which is great news. 05-11-2022 upon evaluation today patient actually appears to be doing excellent. He has been tolerating the dressing changes we actually were trialing a new medication on him last week and this was the ergo clean Ag dressing. With that being said this actually  seems to have done extremely well at this point. Fortunately I do not see any signs of active infection locally or systemically at this time. 05-18-2022 upon evaluation today patient appears to be doing okay in regard to his wound I do not see any signs of worsening also notes any signs of significant improvement. Fortunately I do believe that removing in the right direction in general. 05-25-22 upon evaluation today patient appears to be doing well currently in regard to his  wound. He has been tolerating the dressing changes without complication. Fortunately I do not see any signs of active infection locally nor systemically which is great news and overall I am extremely pleased with where we stand currently. There does not appear to be any signs of active infection locally nor systemically which is great news. No fevers, chills, nausea, vomiting, or diarrhea. 06-01-2022 upon evaluation today patient actually appears to be making excellent progress in regard to his ankle region. I am very pleased with where we stand I think that he is doing extremely well and very pleased with endoform and drawtex combination. 06-08-2022 upon evaluation today patient appears to be doing well currently in regard to his wound. He has been making good progress and I feel like practicing improvement in the overall size of the wound. I am very pleased in that regard. Fortunately I do not see any signs of active infection locally nor systemically WILSIE, CIRELLI D ([id number]) [id number] Page 10 of 12 which is great news. No fevers, chills, nausea, vomiting, or diarrhea. 06-15-2022 upon evaluation today patient appears to be doing well currently in regard to his wound. He has been tolerating the dressing changes without complication. I do believe the endoform has been extremely helpful for him up to this point. I do not see any signs of active infection locally nor systemically which is great news and in general I do believe that we are making pretty good progress here which is excellent news. 06-22-2022 upon evaluation today patient appears to be doing well currently in regard to his wound. He has been tolerating the dressing changes without complication. Fortunately there does not appear to be any signs of active infection at this time which is great news. I think that he is actually taking good progress with the endoform which is excellent as well. 06-29-2022 upon  evaluation patient appears to be doing well currently in regard to his wound. He is actually making good progress towards closure and I am very pleased in that regard. I do not see any evidence of active infection locally nor systemically which is great news and in general I do believe that we are making excellent progress here overall. 07-06-2022 upon evaluation today patient appears to be doing well currently in regard to his wound. He has been tolerating the dressing changes without complication. The good news is he is slowly getting smaller each time I see him. 07-13-2022 upon evaluation today patient appears to be doing well currently in regard to his wound is slowly showing signs of improvement. Fortunately I do not see any signs of infection at this time. No fevers, chills, nausea, vomiting, or diarrhea. 07-20-2022 upon evaluation today patient appears to be doing well currently in regard to his wound. He has been tolerating the dressing changes without complication. Fortunately I do not see any evidence of active infection locally or systemically which is great news and in general I do believe that we are making headway towards complete closure. This is excellent news.  With that being said we are preparing for him to be leaving to go out of town in about a week and a half. For that reason we will going to make a few changes to his dressing today in order to give him something that I think will be easier for him to do when he is out of town. 07-27-2022 upon evaluation today patient appears to be doing well currently in regard to his wound. He has been tolerating the dressing changes without complication. Fortunately there does not appear to be any signs of infection. Fortunately there does not appear to be any signs of infection 08-10-2022 upon evaluation today patient appears to be doing okay in regard to his wound. He actually has been gone for 2 weeks and in the interim unfortunately he did not  have result out of the Tegaderm as well as hoping for but nonetheless took good care of his wound. Does not appear to be infected I do think we need to go back to the compression wrap however which I think would definitely benefit him. Fortunately I do not see any signs of active infection at this time which is great news. Objective Constitutional Well-nourished and well-hydrated in no acute distress. Vitals Time Taken: 2:57 AM, Height: 71 in, Weight: 350 lbs, BMI: 48.8, Temperature: 98.0 F, Pulse: 77 bpm, Respiratory Rate: 18 breaths/min, Blood Pressure: 122/78 mmHg. Respiratory normal breathing without difficulty. Psychiatric this patient is able to make decisions and demonstrates good insight into disease process. Alert and Oriented x 3. pleasant and cooperative. General Notes: Upon inspection patient's wound bed actually showed signs of need for sharp debridement I did perform debridement today clearway necrotic debris patient tolerated this today without complication postdebridement wound bed is significantly improved which is great news. Integumentary (Hair, Skin) Wound #7 status is Open. Original cause of wound was Blister. The date acquired was: 07/21/2021. The wound has been in treatment 53 weeks. The wound is located on the Right,Medial Malleolus. The wound measures 4.2cm length x 1.7cm width x 0.2cm depth; 5.608cm^2 area and 1.122cm^3 volume. There is Fat Layer (Subcutaneous Tissue) exposed. There is no tunneling or undermining noted. There is a medium amount of serosanguineous drainage noted. The wound margin is thickened. There is large (67-100%) red, pink granulation within the wound bed. There is a small (1-33%) amount of necrotic tissue within the wound bed. The periwound skin appearance exhibited: Scarring, Maceration, Hemosiderin Staining. The periwound skin appearance did not exhibit: Callus, Crepitus, Excoriation, Induration, Rash, Dry/Scaly, Atrophie Blanche, Cyanosis,  Ecchymosis, Mottled, Pallor, Rubor, Erythema. 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 There was a Excisional Skin/Subcutaneous Tissue Debridement with a total area of 5.6 sq cm performed by Lenda Kelp, PA. With the following instrument(s): Curette to remove Viable and Non-Viable tissue/material. Material removed includes Callus, Subcutaneous Tissue, Slough, Skin: Dermis, Skin: Epidermis, and Biofilm after achieving pain control using Lidocaine 4% Topical Solution. A time out  was conducted at 15:10, prior to the start of the procedure. A Minimum amount of bleeding was controlled with Pressure. The procedure was tolerated well with a pain level of 0 throughout and a pain level of 0 following the procedure. Post Debridement Measurements: 4.2cm length x 1.7cm width x 0.2cm depth; 1.122cm^3 volume. Character of Wound/Ulcer Post Debridement is improved. Severity of Tissue Post Debridement is: Fat layer exposed. Post procedure Diagnosis Wound #7: Same as Pre-Procedure Pre-procedure diagnosis of Wound #7 is a Venous Leg Ulcer located on the Right,Medial Malleolus . There was a Double Layer Compression Therapy Procedure by Thayer Dallas. Post procedure Diagnosis Wound #7: Same as Pre-Procedure Plan Follow-up  Appointments: Return Appointment in 1 week. Leonard Schwartz Wednesday Return Appointment in 2 weeks. Leonard Schwartz Wednesday Return appointment in 3 weeks. Leonard Schwartz Wednesday Anesthetic: (In clinic) Topical Lidocaine 5% applied to wound bed - Used in Clinic Prior to debridement Cellular or Tissue Based Products: Cellular or Tissue Based Product Type: - Run IVR for Kerrecis- denied #1 Apligraf applied 02/23/2022 #2 Apligraf applied 03/02/2022 #3 Apligraf applied 03/16/2022 #4 Apligraf applied 03/30/2022 HOLD APLIGRAF this week. Cellular or Tissue Based Product applied to wound bed, secured with steri-strips, cover with Adaptic or Mepitel. (DO NOT REMOVE). Bathing/ Shower/ Hygiene: May shower with protection but do not get wound dressing(s) wet. Protect dressing(s) with water repellant cover (for example, large plastic bag) or a cast cover and may then take shower. Edema Control - Lymphedema / SCD / Other: Elevate legs to the level of the heart or above for 30 minutes daily and/or when sitting for 3-4 times a day throughout the day. Avoid standing for long periods of time. Patient to wear own compression stockings every day. - apply in the morning and remove at night left leg. Exercise regularly Moisturize legs daily. - apply every night before bed. Compression stocking or Garment 30-40 mm/Hg pressure to: - wear your VIVE compression stocking to left leg. apply in the morning and remove at night. WOUND #7: - Malleolus Wound Laterality: Right, Medial Cleanser: Soap and Water 1 x Per Week/30 Days Discharge Instructions: May shower and wash wound with dial antibacterial soap and water prior to dressing change. Cleanser: Vashe 5.8 (oz) 1 x Per Week/30 Days Discharge Instructions: Cleanse the wound with Vashe prior to applying a clean dressing using gauze sponges, not tissue or cotton balls. Peri-Wound Care: Sween Lotion (Moisturizing lotion) 1 x Per Week/30 Days Discharge Instructions: Apply moisturizing lotion as  directed Prim Dressing: Endoform 2x2 in 1 x Per Week/30 Days ary Discharge Instructions: Moisten with saline Secondary Dressing: Zetuvit Plus 4x8 in 1 x Per Week/30 Days Discharge Instructions: Apply over primary dressing as directed. Com pression Wrap: Urgo K2, (equivalent to a 4 layer) two layer compression system, regular 1 x Per Week/30 Days Discharge Instructions: Apply Urgo K2 as directed (alternative to 4 layer compression). 1. I am good recommend that we have the patient continue to monitor for any signs of infection or worsening. Based on what I see I do feel like that he is making good progress but I think we need to get back on track with the compression wrapping which was doing much better. 2. I am good recommend as well the patient should continue with the endoform followed by the Zetuvit. 3 and also can recommend the Urgo K2 compression wrap which has done extremely well for him in the past. We will see patient back for reevaluation in 1 week here in the clinic.  If anything worsens or changes patient will contact our office for additional recommendations. Electronic Signature(s) Signed: 08/10/2022 3:38:11 PM By: Allen Derry PA-C Entered By: Allen Derry on 08/10/2022 15:38:10 -------------------------------------------------------------------------------- SuperBill Details Patient Name: Date of Service: Vassel, [name] 08/10/2022 Rozetta Nunnery D ([id number]) [id number] Page 12 of 12 Medical Record Number: [id number] Patient Account Number: [id number] Date of Birth/Sex: Treating RN: 03-Nov-1955 (67 y.o. M) Primary Care Provider: [name] Other Clinician: Referring Provider: Treating Provider/Extender: Alver Fisher in Treatment: 53 Diagnosis Coding ICD-10 Codes Code Description I89.0 Lymphedema, not elsewhere classified I87.333 Chronic venous hypertension (idiopathic) with ulcer and inflammation of bilateral lower  extremity L97.828 Non-pressure chronic ulcer of other part of left lower leg with other specified severity L97.818 Non-pressure chronic ulcer of other part of right lower leg with other specified severity E11.621 Type 2 diabetes mellitus with foot ulcer L97.518 Non-pressure chronic ulcer of other part of right foot with other specified severity Facility Procedures : CPT4 Code: 93716967 Description: 11042 - DEB SUBQ TISSUE 20 SQ CM/< ICD-10 Diagnosis Description L97.818 Non-pressure chronic ulcer of other part of right lower leg with other specified Modifier: severity Quantity: 1 Physician Procedures : CPT4 Code Description Modifier [id number] 11042 - WC PHYS SUBQ TISS 20 SQ CM ICD-10 Diagnosis Description L97.818 Non-pressure chronic ulcer of other part of right lower leg with other specified severity Quantity: 1 Electronic Signature(s) Signed: 08/10/2022 3:38:25 PM By: Allen Derry PA-C Entered By: Allen Derry on 08/10/2022 15:38:24

## 2022-08-12 NOTE — Progress Notes (Signed)
NOMAR, BROAD ([id number]) [id number] Page 1 of 6 Visit Report for 08/10/2022 Arrival Information Details Patient Name: Date of Service: [name], [name] 08/10/2022 3:00 PM Medical Record Number: [id number] Patient Account Number: [id number] Date of Birth/Sex: Treating RN: [birth year] (67 y.o. M) Primary Care : [name] Other Clinician: Referring : Treating /Extender: Alver Fisher in Treatment: 61 Visit Information History Since Last Visit Added or deleted any medications: No Patient Arrived: [name] Any new allergies or adverse reactions: No Arrival Time: 14:41 Had a fall or experienced change in No Accompanied By: self activities of daily living that may affect Transfer Assistance: None risk of falls: Patient Identification Verified: Yes Signs or symptoms of abuse/neglect since last visito No Secondary Verification Process Completed: Yes Hospitalized since last visit: No Patient Requires Transmission-Based Precautions: No Implantable device outside of the clinic excluding No Patient Has Alerts: Yes cellular tissue based products placed in the center Patient Alerts: Patient on Blood Thinner since last visit: 01/2021 ABI L 1.13 R 1.06 Has Dressing in Place as Prescribed: Yes 01/2021 TBI L amp R 0.8 Pain Present Now: No Electronic Signature(s) Signed: 08/12/2022 11:53:17 AM By: Thayer Dallas Entered By: Thayer Dallas on 08/10/2022 14:53:09 -------------------------------------------------------------------------------- Compression Therapy Details Patient Name: Date of Service: [name], [name] 08/10/2022 3:00 PM Medical Record Number: [id number] Patient Account Number: [id number] Date of Birth/Sex: Treating RN: Sep 30, 1955 (67 y.o. [name] Primary Care : [name] Other Clinician: Referring : Treating /Extender: Alver Fisher in Treatment:  08 Compression Therapy Performed for Wound Assessment: Wound #7 Right,Medial Malleolus Performed By: Clinician Thayer Dallas, Compression Type: Double Layer Post Procedure Diagnosis Same as Pre-procedure Electronic Signature(s) Signed: 08/10/2022 5:15:20 PM By: Shawn Stall RN, BSN Entered By: Shawn Stall on 08/10/2022 15:17:39 [name], [name] ([id number]) [id number] Page 2 of 6 -------------------------------------------------------------------------------- Encounter Discharge Information Details Patient Name: Date of Service: [name], [name] 08/10/2022 3:00 PM Medical Record Number: [id number] Patient Account Number: [id number] Date of Birth/Sex: Treating RN: 1955/11/09 (67 y.o. [name] Primary Care : [name] Other Clinician: Referring : Treating /Extender: Alver Fisher in Treatment: 59 Encounter Discharge Information Items Post Procedure Vitals Discharge Condition: Stable Temperature (F): 98 Ambulatory Status: Walker Pulse (bpm): 77 Discharge Destination: Home Respiratory Rate (breaths/min): 20 Transportation: Private Auto Blood Pressure (mmHg): 122/78 Accompanied By: self Schedule Follow-up Appointment: Yes Clinical Summary of Care: Electronic Signature(s) Signed: 08/10/2022 5:15:20 PM By: Shawn Stall RN, BSN Entered By: Shawn Stall on 08/10/2022 15:18:21 -------------------------------------------------------------------------------- Lower Extremity Assessment Details Patient Name: Date of Service: [name], [name] 08/10/2022 3:00 PM Medical Record Number: [id number] Patient Account Number: [id number] Date of Birth/Sex: Treating RN: May 05, 1955 (67 y.o. M) Primary Care : [name] Other Clinician: Referring : Treating /Extender: Alver Fisher in Treatment: 53 Edema Assessment Assessed: [Left: No] [Right: No] Edema: [Left: N]  [Right: o] Calf Left: Right: Point of Measurement: 31 cm From Medial Instep 43 cm Ankle Left: Right: Point of Measurement: 11 cm From Medial Instep 27 cm Vascular Assessment Pulses: Dorsalis Pedis Palpable: [Right:Yes] Extremity colors, hair growth, and conditions: Extremity Color: [Right:Normal] Hair Growth on Extremity: [Right:No] Temperature of Extremity: [Right:Warm] Capillary Refill: [Right:< 3 seconds] Dependent Rubor: [Right:No Yes] Electronic Signature(s) Signed: 08/12/2022 11:53:17 AM By: Thayer Dallas Entered By: Thayer Dallas on 08/10/2022 14:58:23 [name], [name] ([id number]) [id number] Page 3 of 6 -------------------------------------------------------------------------------- Multi-Disciplinary Care Plan Details Patient Name: Date of Service: [name], [name]  [name] 08/10/2022 3:00 PM Medical Record Number: [id number] Patient Account Number: [id number] Date of Birth/Sex: Treating RN: 1955-03-28 (67 y.o. [name] Primary Care : [name] Other Clinician: Referring : Treating /Extender: Alver Fisher in Treatment: 51 Multidisciplinary Care Plan reviewed with physician Active Inactive Pain, Acute or Chronic Nursing Diagnoses: Pain, acute or chronic: actual or potential Potential alteration in comfort, pain Goals: Patient will verbalize adequate pain control and receive pain control interventions during procedures as needed Date Initiated: 08/04/2021 Target Resolution Date: 09/10/2022 Goal Status: Active Patient/caregiver will verbalize comfort level met Date Initiated: 08/04/2021 Date Inactivated: 07/27/2022 Target Resolution Date: 09/10/2022 Goal Status: Met Interventions: Encourage patient to take pain medications as prescribed Provide education on pain management Reposition patient for comfort Treatment Activities: Administer pain control measures as ordered :  08/04/2021 Notes: Electronic Signature(s) Signed: 08/10/2022 5:15:20 PM By: Shawn Stall RN, BSN Entered By: Shawn Stall on 08/10/2022 15:16:38 -------------------------------------------------------------------------------- Pain Assessment Details Patient Name: Date of Service: [name], [name] 08/10/2022 3:00 PM Medical Record Number: [id number] Patient Account Number: [id number] Date of Birth/Sex: Treating RN: April 25, 1955 (67 y.o. M) Primary Care : [name] Other Clinician: Referring : Treating /Extender: Alver Fisher in Treatment: 93 Active Problems Location of Pain Severity and Description of Pain Patient Has Paino No Site Locations [name], [name] ([id number]) [id number] Page 4 of 6 Pain Management and Medication Current Pain Management: Electronic Signature(s) Signed: 08/12/2022 11:53:17 AM By: Thayer Dallas Entered By: Thayer Dallas on 08/10/2022 14:55:22 -------------------------------------------------------------------------------- Patient/Caregiver Education Details Patient Name: Date of Service: [name], [name] 7/31/2024andnbsp3:00 PM Medical Record Number: [id number] Patient Account Number: [id number] Date of Birth/Gender: Treating RN: [birth year] (67 y.o. [name] Primary Care Physician: [name] Other Clinician: Referring Physician: Treating Physician/Extender: Alver Fisher in Treatment: 28 Education Assessment Education Provided To: Patient Education Topics Provided Wound/Skin Impairment: Handouts: Caring for Your Ulcer Methods: Explain/Verbal Responses: Reinforcements needed Electronic Signature(s) Signed: 08/10/2022 5:15:20 PM By: Shawn Stall RN, BSN Entered By: Shawn Stall on 08/10/2022 15:17:05 -------------------------------------------------------------------------------- Wound Assessment Details Patient Name: Date of Service: [name], [name]  [name] 08/10/2022 3:00 PM Medical Record Number: [id number] Patient Account Number: [id number] Date of Birth/Sex: Treating RN: 03/19/1955 (67 y.o. M) Primary Care : [name] Other Clinician: MELFORD, TULLIER [name] ([id number]) [id number] Page 5 of 6 Referring : Treating /Extender: Alver Fisher in Treatment: 53 Wound Status Wound Number: 7 Primary Venous Leg Ulcer Etiology: Wound Location: Right, Medial Malleolus Secondary Lymphedema Wounding Event: Blister Etiology: Date Acquired: 07/21/2021 Wound Open Weeks Of Treatment: 53 Status: Clustered Wound: No Comorbid Sleep Apnea, Arrhythmia, Congestive Heart Failure, History: Hypertension, Hypotension, Peripheral Arterial Disease, Peripheral Venous Disease, Type II Diabetes, Gout, Osteoarthritis Photos Wound Measurements Length: (cm) Width: (cm) Depth: (cm) Area: (cm) Volume: (cm) 4.2 % Reduction in Area: 85.4% 1.7 % Reduction in Volume: 95.8% 0.2 Epithelialization: Medium (34-66%) 5.608 Tunneling: No 1.122 Undermining: No Wound Description Classification: Full Thickness With Exposed Supp Wound Margin: Thickened Exudate Amount: Medium Exudate Type: Serosanguineous Exudate Color: red, brown ort Structures Foul Odor After Cleansing: No Slough/Fibrino Yes Wound Bed Granulation Amount: Large (67-100%) Exposed Structure Granulation Quality: Red, Pink Fascia Exposed: No Necrotic Amount: Small (1-33%) Fat Layer (Subcutaneous Tissue) Exposed: Yes Tendon Exposed: No Muscle Exposed: No Joint Exposed: No Bone Exposed: No Periwound Skin Texture Texture Color No Abnormalities Noted: No No Abnormalities Noted: No Callus: No Atrophie Blanche: No Crepitus: No Cyanosis: No Excoriation:  No Ecchymosis: No Induration: No Erythema: No Rash: No Hemosiderin Staining: Yes Scarring: Yes Mottled: No Pallor: No Moisture Rubor: No No Abnormalities Noted: No Dry  / Scaly: No Maceration: Yes Treatment Notes Wound #7 (Malleolus) Wound Laterality: Right, Medial Cleanser Soap and Water Discharge Instruction: May shower and wash wound with dial antibacterial soap and water prior to dressing change. Vashe 5.8 (oz) Discharge Instruction: Cleanse the wound with Vashe prior to applying a clean dressing using gauze sponges, not tissue or cotton balls. TRAETON, BORDAS ([id number]) [id number] Page 6 of 6 Peri-Wound Care Sween Lotion (Moisturizing lotion) Discharge Instruction: Apply moisturizing lotion as directed Topical Primary Dressing Endoform 2x2 in Discharge Instruction: Moisten with saline Secondary Dressing Zetuvit Plus 4x8 in Discharge Instruction: Apply over primary dressing as directed. Secured With Compression Wrap Urgo K2, (equivalent to a 4 layer) two layer compression system, regular Discharge Instruction: Apply Urgo K2 as directed (alternative to 4 layer compression). Compression Stockings Add-Ons Electronic Signature(s) Signed: 08/12/2022 11:53:17 AM By: Thayer Dallas Entered By: Thayer Dallas on 08/10/2022 14:59:02 -------------------------------------------------------------------------------- Vitals Details Patient Name: Date of Service: [name], [name] 08/10/2022 3:00 PM Medical Record Number: [id number] Patient Account Number: [id number] Date of Birth/Sex: Treating RN: 13-Jun-1955 (66 y.o. M) Primary Care : [name] Other Clinician: Referring : Treating /Extender: Alver Fisher in Treatment: 53 Vital Signs Time Taken: 02:57 Temperature (F): 98.0 Height (in): 71 Pulse (bpm): 77 Weight (lbs): 350 Respiratory Rate (breaths/min): 18 Body Mass Index (BMI): 48.8 Blood Pressure (mmHg): 122/78 Reference Range: 80 - 120 mg / dl Electronic Signature(s) Signed: 08/12/2022 11:53:17 AM By: Thayer Dallas Entered By: Thayer Dallas on 08/10/2022 14:58:08

## 2022-08-16 DIAGNOSIS — G4733 Obstructive sleep apnea (adult) (pediatric): Secondary | ICD-10-CM | POA: Diagnosis not present

## 2022-08-17 ENCOUNTER — Encounter (HOSPITAL_BASED_OUTPATIENT_CLINIC_OR_DEPARTMENT_OTHER): Payer: Medicare Other | Attending: Physician Assistant | Admitting: Physician Assistant

## 2022-08-17 ENCOUNTER — Inpatient Hospital Stay: Admission: RE | Admit: 2022-08-17 | Payer: Medicare Other | Source: Ambulatory Visit

## 2022-08-17 DIAGNOSIS — M109 Gout, unspecified: Secondary | ICD-10-CM | POA: Insufficient documentation

## 2022-08-17 DIAGNOSIS — I87333 Chronic venous hypertension (idiopathic) with ulcer and inflammation of bilateral lower extremity: Secondary | ICD-10-CM | POA: Diagnosis not present

## 2022-08-17 DIAGNOSIS — L97818 Non-pressure chronic ulcer of other part of right lower leg with other specified severity: Secondary | ICD-10-CM | POA: Diagnosis not present

## 2022-08-17 DIAGNOSIS — E1151 Type 2 diabetes mellitus with diabetic peripheral angiopathy without gangrene: Secondary | ICD-10-CM | POA: Insufficient documentation

## 2022-08-17 DIAGNOSIS — L97518 Non-pressure chronic ulcer of other part of right foot with other specified severity: Secondary | ICD-10-CM | POA: Diagnosis not present

## 2022-08-17 DIAGNOSIS — Z7901 Long term (current) use of anticoagulants: Secondary | ICD-10-CM | POA: Diagnosis not present

## 2022-08-17 DIAGNOSIS — M1990 Unspecified osteoarthritis, unspecified site: Secondary | ICD-10-CM | POA: Diagnosis not present

## 2022-08-17 DIAGNOSIS — I890 Lymphedema, not elsewhere classified: Secondary | ICD-10-CM | POA: Insufficient documentation

## 2022-08-17 DIAGNOSIS — L97828 Non-pressure chronic ulcer of other part of left lower leg with other specified severity: Secondary | ICD-10-CM | POA: Insufficient documentation

## 2022-08-17 DIAGNOSIS — I2510 Atherosclerotic heart disease of native coronary artery without angina pectoris: Secondary | ICD-10-CM | POA: Diagnosis not present

## 2022-08-17 DIAGNOSIS — I509 Heart failure, unspecified: Secondary | ICD-10-CM | POA: Diagnosis not present

## 2022-08-17 DIAGNOSIS — E11621 Type 2 diabetes mellitus with foot ulcer: Secondary | ICD-10-CM | POA: Diagnosis not present

## 2022-08-17 DIAGNOSIS — I872 Venous insufficiency (chronic) (peripheral): Secondary | ICD-10-CM | POA: Diagnosis not present

## 2022-08-17 DIAGNOSIS — I110 Hypertensive heart disease with heart failure: Secondary | ICD-10-CM | POA: Insufficient documentation

## 2022-08-17 DIAGNOSIS — I87331 Chronic venous hypertension (idiopathic) with ulcer and inflammation of right lower extremity: Secondary | ICD-10-CM | POA: Diagnosis not present

## 2022-08-17 DIAGNOSIS — L97312 Non-pressure chronic ulcer of right ankle with fat layer exposed: Secondary | ICD-10-CM | POA: Diagnosis not present

## 2022-08-17 NOTE — Progress Notes (Signed)
LERONE, CHAVOYA ([id number]) [id number] Page 1 of 2 Visit Report for 08/17/2022 Chief Complaint Document Details Patient Name: Date of Service: [name], [name] 08/17/2022 3:00 PM Medical Record Number: [id number] Patient Account Number: [id number] Date of Birth/Sex: Treating RN: 08-10-1955 (67 y.o. M) Primary Care Provider: [name] Other Clinician: Referring Provider: Treating Provider/Extender: Alver Fisher in Treatment: 67 Information Obtained from: Patient Chief Complaint 08/04/2021; patient returns to clinic with bilateral leg wounds as well as areas on the right foot Electronic Signature(s) Signed: 08/17/2022 2:58:11 PM By: Allen Derry PA-C Entered By: Allen Derry on 08/17/2022 14:58:11 -------------------------------------------------------------------------------- Problem List Details Patient Name: Date of Service: [name], [name] 08/17/2022 3:00 PM Medical Record Number: [id number] Patient Account Number: [id number] Date of Birth/Sex: Treating RN: 1956-01-11 (67 y.o. M) Primary Care Provider: [name] Other Clinician: Referring Provider: Treating Provider/Extender: Alver Fisher in Treatment: 54 Active Problems ICD-10 Encounter Code Description Active Date MDM Diagnosis I89.0 Lymphedema, not elsewhere classified 08/04/2021 No Yes I87.333 Chronic venous hypertension (idiopathic) with ulcer and inflammation of 08/04/2021 No Yes bilateral lower extremity L97.828 Non-pressure chronic ulcer of other part of left lower leg with other 08/04/2021 No Yes specified severity L97.818 Non-pressure chronic ulcer of other part of right lower leg with other 08/04/2021 No Yes specified severity E11.621 Type 2 diabetes mellitus with foot ulcer 08/04/2021 No Yes L97.518 Non-pressure chronic ulcer of other part of right foot with other specified7/26/2023 No Yes [name], [name] ([id number])  [id number] Page 2 of 2 severity Inactive Problems Resolved Problems Electronic Signature(s) Signed: 08/17/2022 2:57:57 PM By: Allen Derry PA-C Entered By: Allen Derry on 08/17/2022 14:57:57

## 2022-08-19 NOTE — Progress Notes (Signed)
[name], [name] ([id number]) [id number] Page 1 of 6 Visit Report for 08/17/2022 Arrival Information Details Patient Name: Date of Service: [name], [name] 08/17/2022 3:00 PM Medical Record Number: [id number] Patient Account Number: [id number] Date of Birth/Sex: Treating RN: [birth year] (67 y.o. M) Primary Care : [name] Other Clinician: Referring : Treating /Extender: Alver Fisher in Treatment: 10 Visit Information History Since Last Visit Added or deleted any medications: No Patient Arrived: [name] Any new allergies or adverse reactions: No Arrival Time: 15:07 Had a fall or experienced change in No Accompanied By: self activities of daily living that may affect Transfer Assistance: None risk of falls: Patient Identification Verified: Yes Signs or symptoms of abuse/neglect since last visito No Secondary Verification Process Completed: Yes Hospitalized since last visit: No Patient Requires Transmission-Based Precautions: No Implantable device outside of the clinic excluding No Patient Has Alerts: Yes cellular tissue based products placed in the center Patient Alerts: Patient on Blood Thinner since last visit: 01/2021 ABI L 1.13 R 1.06 Has Dressing in Place as Prescribed: Yes 01/2021 TBI L amp R 0.8 Has Compression in Place as Prescribed: Yes Pain Present Now: No Electronic Signature(s) Signed: 08/19/2022 12:52:02 PM By: Thayer Dallas Entered By: Thayer Dallas on 08/17/2022 15:09:04 -------------------------------------------------------------------------------- Compression Therapy Details Patient Name: Date of Service: [name], [name] 08/17/2022 3:00 PM Medical Record Number: [id number] Patient Account Number: [id number] Date of Birth/Sex: Treating RN: December 09, 1955 (67 y.o. [name] Primary Care : [name] Other Clinician: Referring : Treating /Extender: Alver Fisher in Treatment: 40 Compression Therapy Performed for Wound Assessment: Wound #7 Right,Medial Malleolus Performed By: Clinician Shawn Stall, RN Compression Type: Double Layer Post Procedure Diagnosis Same as Pre-procedure Electronic Signature(s) Signed: 08/17/2022 5:28:01 PM By: Shawn Stall RN, BSN Entered By: Shawn Stall on 08/17/2022 15:45:27 [name], [name] ([id number]) [id number] Page 2 of 6 -------------------------------------------------------------------------------- Encounter Discharge Information Details Patient Name: Date of Service: TYHIR, RODDA 08/17/2022 3:00 PM Medical Record Number: [id number] Patient Account Number: [id number] Date of Birth/Sex: Treating RN: 11-04-1955 (67 y.o. [name] Primary Care : [name] Other Clinician: Referring : Treating /Extender: Alver Fisher in Treatment: 53 Encounter Discharge Information Items Post Procedure Vitals Discharge Condition: Stable Temperature (F): 98.5 Ambulatory Status: Walker Pulse (bpm): 70 Discharge Destination: Home Respiratory Rate (breaths/min): 18 Transportation: Private Auto Blood Pressure (mmHg): 113/75 Accompanied By: self Schedule Follow-up Appointment: Yes Clinical Summary of Care: Electronic Signature(s) Signed: 08/17/2022 5:28:01 PM By: Shawn Stall RN, BSN Entered By: Shawn Stall on 08/17/2022 15:50:42 -------------------------------------------------------------------------------- Lower Extremity Assessment Details Patient Name: Date of Service: Leibensperger, [name] 08/17/2022 3:00 PM Medical Record Number: [id number] Patient Account Number: [id number] Date of Birth/Sex: Treating RN: Aug 18, 1955 (67 y.o. M) Primary Care : [name] Other Clinician: Referring : Treating /Extender: Alver Fisher in Treatment: 54 Edema Assessment Assessed:  [Left: No] [Right: No] Edema: [Left: N] [Right: o] Calf Left: Right: Point of Measurement: 31 cm From Medial Instep 39 cm Ankle Left: Right: Point of Measurement: 11 cm From Medial Instep 26 cm Vascular Assessment Extremity colors, hair growth, and conditions: Extremity Color: [Right:Normal] Hair Growth on Extremity: [Right:No] Temperature of Extremity: [Right:Warm] Capillary Refill: [Right:< 3 seconds] Dependent Rubor: [Right:No Yes] Electronic Signature(s) Signed: 08/19/2022 12:52:02 PM By: Thayer Dallas Entered By: Thayer Dallas on 08/17/2022 15:29:09 [name], [name] ([id number]) [id number] Page 3 of 6 -------------------------------------------------------------------------------- Multi-Disciplinary Care Plan Details Patient Name: Date of  Service: [name], [name]. 08/17/2022 3:00 PM Medical Record Number: [id number] Patient Account Number: [id number] Date of Birth/Sex: Treating RN: Feb 14, 1955 (67 y.o. [name] Primary Care : [name] Other Clinician: Referring : Treating /Extender: Alver Fisher in Treatment: 37 Multidisciplinary Care Plan reviewed with physician Active Inactive Pain, Acute or Chronic Nursing Diagnoses: Pain, acute or chronic: actual or potential Potential alteration in comfort, pain Goals: Patient will verbalize adequate pain control and receive pain control interventions during procedures as needed Date Initiated: 08/04/2021 Target Resolution Date: 09/10/2022 Goal Status: Active Patient/caregiver will verbalize comfort level met Date Initiated: 08/04/2021 Date Inactivated: 07/27/2022 Target Resolution Date: 09/10/2022 Goal Status: Met Interventions: Encourage patient to take pain medications as prescribed Provide education on pain management Reposition patient for comfort Treatment Activities: Administer pain control measures as ordered : 08/04/2021 Notes: Electronic  Signature(s) Signed: 08/17/2022 5:28:01 PM By: Shawn Stall RN, BSN Entered By: Shawn Stall on 08/17/2022 15:49:24 -------------------------------------------------------------------------------- Pain Assessment Details Patient Name: Date of Service: [name], [name] 08/17/2022 3:00 PM Medical Record Number: [id number] Patient Account Number: [id number] Date of Birth/Sex: Treating RN: 04-23-1955 (67 y.o. M) Primary Care : [name] Other Clinician: Referring : Treating /Extender: Alver Fisher in Treatment: 27 Active Problems Location of Pain Severity and Description of Pain Patient Has Paino No Site Locations KANAI, DEBOLT [name] ([id number]) [id number] Page 4 of 6 Pain Management and Medication Current Pain Management: Electronic Signature(s) Signed: 08/19/2022 12:52:02 PM By: Thayer Dallas Entered By: Thayer Dallas on 08/17/2022 15:12:19 -------------------------------------------------------------------------------- Patient/Caregiver Education Details Patient Name: Date of Service: [name], [name] 8/7/2024andnbsp3:00 PM Medical Record Number: [id number] Patient Account Number: [id number] Date of Birth/Gender: Treating RN: Apr 05, 1955 (67 y.o. [name] Primary Care Physician: [name] Other Clinician: Referring Physician: Treating Physician/Extender: Alver Fisher in Treatment: 89 Education Assessment Education Provided To: Patient Education Topics Provided Wound/Skin Impairment: Handouts: Caring for Your Ulcer Methods: Explain/Verbal Responses: Reinforcements needed Electronic Signature(s) Signed: 08/17/2022 5:28:01 PM By: Shawn Stall RN, BSN Entered By: Shawn Stall on 08/17/2022 15:49:36 -------------------------------------------------------------------------------- Wound Assessment Details Patient Name: Date of Service: [name], [name] 08/17/2022 3:00 PM Medical  Record Number: [id number] Patient Account Number: [id number] Date of Birth/Sex: Treating RN: October 06, 1955 (67 y.o. M) Primary Care : [name] Other Clinician: SAMAAD, [name] ([id number]) [id number] Page 5 of 6 Referring : Treating /Extender: Alver Fisher in Treatment: 54 Wound Status Wound Number: 7 Primary Venous Leg Ulcer Etiology: Wound Location: Right, Medial Malleolus Secondary Lymphedema Wounding Event: Blister Etiology: Date Acquired: 07/21/2021 Wound Open Weeks Of Treatment: 54 Status: Clustered Wound: No Comorbid Sleep Apnea, Arrhythmia, Congestive Heart Failure, History: Hypertension, Hypotension, Peripheral Arterial Disease, Peripheral Venous Disease, Type II Diabetes, Gout, Osteoarthritis Photos Wound Measurements Length: (cm) 3.8 Width: (cm) 1.4 Depth: (cm) 0.2 Area: (cm) 4.178 Volume: (cm) 0.836 % Reduction in Area: 89.1% % Reduction in Volume: 96.9% Epithelialization: Medium (34-66%) Tunneling: No Undermining: No Wound Description Classification: Full Thickness With Exposed Supp Wound Margin: Thickened Exudate Amount: Medium Exudate Type: Serosanguineous Exudate Color: red, brown ort Structures Foul Odor After Cleansing: Yes Due to Product Use: No Slough/Fibrino Yes Wound Bed Granulation Amount: Medium (34-66%) Exposed Structure Granulation Quality: Red, Pink Fascia Exposed: No Necrotic Amount: Medium (34-66%) Fat Layer (Subcutaneous Tissue) Exposed: Yes Necrotic Quality: Adherent Slough Tendon Exposed: No Muscle Exposed: No Joint Exposed: No Bone Exposed: No Periwound Skin Texture Texture Color No Abnormalities Noted: No No Abnormalities  Noted: No Callus: No Atrophie Blanche: No Crepitus: No Cyanosis: No Excoriation: No Ecchymosis: No Induration: No Erythema: No Rash: No Hemosiderin Staining: Yes Scarring: Yes Mottled: No Pallor: No Moisture Rubor: No No  Abnormalities Noted: No Dry / Scaly: Yes Maceration: Yes Treatment Notes Wound #7 (Malleolus) Wound Laterality: Right, Medial Cleanser Soap and Water Discharge Instruction: May shower and wash wound with dial antibacterial soap and water prior to dressing change. Vashe 5.8 (oz) Discharge Instruction: Cleanse the wound with Vashe prior to applying a clean dressing using gauze sponges, not tissue or cotton balls. [name], [name] ([id number]) [id number] Page 6 of 6 Peri-Wound Care Sween Lotion (Moisturizing lotion) Discharge Instruction: Apply moisturizing lotion as directed Topical Gentamicin Discharge Instruction: As directed by physician Primary Dressing Hydrofera Blue Ready Transfer Foam, 2.5x2.5 (in/in) Discharge Instruction: Apply directly to wound bed as directed Secondary Dressing Zetuvit Plus 4x8 in Discharge Instruction: Apply over primary dressing as directed. Secured With Compression Wrap Urgo K2, (equivalent to a 4 layer) two layer compression system, regular Discharge Instruction: Apply Urgo K2 as directed (alternative to 4 layer compression). Compression Stockings Add-Ons Electronic Signature(s) Signed: 08/19/2022 12:52:02 PM By: Thayer Dallas Entered By: Thayer Dallas on 08/17/2022 15:29:53 -------------------------------------------------------------------------------- Vitals Details Patient Name: Date of Service: [name], [name] 08/17/2022 3:00 PM Medical Record Number: [id number] Patient Account Number: [id number] Date of Birth/Sex: Treating RN: [birth year] (67 y.o. M) Primary Care : [name] Other Clinician: Referring : Treating /Extender: Alver Fisher in Treatment: 54 Vital Signs Time Taken: 15:09 Temperature (F): 98.5 Height (in): 71 Pulse (bpm): 70 Weight (lbs): 350 Respiratory Rate (breaths/min): 18 Body Mass Index (BMI): 48.8 Blood Pressure (mmHg): 113/75 Reference Range:  80 - 120 mg / dl Electronic Signature(s) Signed: 08/19/2022 12:52:02 PM By: Thayer Dallas Entered By: Thayer Dallas on 08/17/2022 15:11:51

## 2022-08-22 ENCOUNTER — Encounter (HOSPITAL_BASED_OUTPATIENT_CLINIC_OR_DEPARTMENT_OTHER): Payer: Medicare Other | Admitting: Internal Medicine

## 2022-08-22 DIAGNOSIS — L97828 Non-pressure chronic ulcer of other part of left lower leg with other specified severity: Secondary | ICD-10-CM | POA: Diagnosis not present

## 2022-08-22 DIAGNOSIS — I890 Lymphedema, not elsewhere classified: Secondary | ICD-10-CM | POA: Diagnosis not present

## 2022-08-22 DIAGNOSIS — I2510 Atherosclerotic heart disease of native coronary artery without angina pectoris: Secondary | ICD-10-CM | POA: Diagnosis not present

## 2022-08-22 DIAGNOSIS — E1151 Type 2 diabetes mellitus with diabetic peripheral angiopathy without gangrene: Secondary | ICD-10-CM | POA: Diagnosis not present

## 2022-08-22 DIAGNOSIS — L97518 Non-pressure chronic ulcer of other part of right foot with other specified severity: Secondary | ICD-10-CM | POA: Diagnosis not present

## 2022-08-22 DIAGNOSIS — I872 Venous insufficiency (chronic) (peripheral): Secondary | ICD-10-CM | POA: Diagnosis not present

## 2022-08-22 DIAGNOSIS — I509 Heart failure, unspecified: Secondary | ICD-10-CM | POA: Diagnosis not present

## 2022-08-22 DIAGNOSIS — L97818 Non-pressure chronic ulcer of other part of right lower leg with other specified severity: Secondary | ICD-10-CM | POA: Diagnosis not present

## 2022-08-22 DIAGNOSIS — I87333 Chronic venous hypertension (idiopathic) with ulcer and inflammation of bilateral lower extremity: Secondary | ICD-10-CM | POA: Diagnosis not present

## 2022-08-22 DIAGNOSIS — K080 Exfoliation of teeth due to systemic causes: Secondary | ICD-10-CM | POA: Diagnosis not present

## 2022-08-22 DIAGNOSIS — Z7901 Long term (current) use of anticoagulants: Secondary | ICD-10-CM | POA: Diagnosis not present

## 2022-08-22 DIAGNOSIS — I110 Hypertensive heart disease with heart failure: Secondary | ICD-10-CM | POA: Diagnosis not present

## 2022-08-22 DIAGNOSIS — M1990 Unspecified osteoarthritis, unspecified site: Secondary | ICD-10-CM | POA: Diagnosis not present

## 2022-08-22 DIAGNOSIS — E11621 Type 2 diabetes mellitus with foot ulcer: Secondary | ICD-10-CM | POA: Diagnosis not present

## 2022-08-22 DIAGNOSIS — M109 Gout, unspecified: Secondary | ICD-10-CM | POA: Diagnosis not present

## 2022-08-22 NOTE — Progress Notes (Signed)
[name], [name] ([id number]) [id number] Page 1 of 1 Visit Report for 08/22/2022 SuperBill Details Patient Name: Date of Service: [name], [name] 08/22/2022 Medical Record Number: [id number] Patient Account Number: [id number] Date of Birth/Sex: Treating RN: [birth year] (67 y.o. Harlon Flor, Millard.Loa Primary Care Provider: [name] Other Clinician: Referring Provider: Treating Provider/Extender: Jamse Mead in Treatment: 54 Diagnosis Coding ICD-10 Codes Code Description I89.0 Lymphedema, not elsewhere classified I87.333 Chronic venous hypertension (idiopathic) with ulcer and inflammation of bilateral lower extremity L97.828 Non-pressure chronic ulcer of other part of left lower leg with other specified severity L97.818 Non-pressure chronic ulcer of other part of right lower leg with other specified severity E11.621 Type 2 diabetes mellitus with foot ulcer L97.518 Non-pressure chronic ulcer of other part of right foot with other specified severity Facility Procedures CPT4 Code Description Modifier Quantity [id number] (Facility Use Only) [phone] - APPLY MULTLAY COMPRS LWR RT LEG 1 Electronic Signature(s) Signed: 08/22/2022 8:49:18 AM By: Geralyn Corwin DO Signed: 08/22/2022 3:26:57 PM By: Shawn Stall RN, BSN Entered By: Shawn Stall on 08/22/2022 08:25:58

## 2022-08-22 NOTE — Progress Notes (Signed)
[name], [name] ([id number]) [id number] Page 1 of 3 Visit Report for 08/22/2022 Arrival Information Details Patient Name: Date of Service: [name], [name] 08/22/2022 8:15 A M Medical Record Number: [id number] Patient Account Number: [id number] Date of Birth/Sex: Treating RN: [birth year] (67 y.o. [name], Millard.Loa Primary Care : [name] Other Clinician: Referring : Treating /Extender: Jamse Mead in Treatment: 20 Visit Information History Since Last Visit Added or deleted any medications: No Patient Arrived: [name] Any new allergies or adverse reactions: No Arrival Time: 08:00 Had a fall or experienced change in No Accompanied By: self activities of daily living that may affect Transfer Assistance: None risk of falls: Patient Identification Verified: Yes Signs or symptoms of abuse/neglect since last visito No Secondary Verification Process Completed: Yes Hospitalized since last visit: No Patient Requires Transmission-Based Precautions: No Implantable device outside of the clinic excluding No Patient Has Alerts: Yes cellular tissue based products placed in the center Patient Alerts: Patient on Blood Thinner since last visit: 01/2021 ABI L 1.13 R 1.06 Has Dressing in Place as Prescribed: Yes 01/2021 TBI L amp R 0.8 Has Compression in Place as Prescribed: Yes Pain Present Now: No Electronic Signature(s) Signed: 08/22/2022 3:26:57 PM By: Shawn Stall RN, BSN Entered By: Shawn Stall on 08/22/2022 08:25:07 -------------------------------------------------------------------------------- Compression Therapy Details Patient Name: Date of Service: Neenan, [name] 08/22/2022 8:15 A M Medical Record Number: [id number] Patient Account Number: [id number] Date of Birth/Sex: Treating RN: February 19, 1955 (67 y.o. [name] Primary Care : [name] Other Clinician: Referring : Treating  /Extender: Jamse Mead in Treatment: 38 Compression Therapy Performed for Wound Assessment: Wound #7 Right,Medial Malleolus Performed By: Clinician Shawn Stall, RN Compression Type: Double Layer Electronic Signature(s) Signed: 08/22/2022 3:26:57 PM By: Shawn Stall RN, BSN Entered By: Shawn Stall on 08/22/2022 08:25:31 -------------------------------------------------------------------------------- Encounter Discharge Information Details Patient Name: Date of Service: [name], [name] 08/22/2022 8:15 A M Medical Record Number: [id number] Patient Account Number: [id number] CARDAE, [name] ([id number]) [id number] Page 2 of 3 Date of Birth/Sex: Treating RN: [birth year] (67 y.o. [name] Primary Care : Other Clinician: [name] Referring : Treating /Extender: Jamse Mead in Treatment: 22 Encounter Discharge Information Items Discharge Condition: Stable Ambulatory Status: Walker Discharge Destination: Home Transportation: Private Auto Accompanied By: self Schedule Follow-up Appointment: Yes Clinical Summary of Care: Electronic Signature(s) Signed: 08/22/2022 3:26:57 PM By: Shawn Stall RN, BSN Entered By: Shawn Stall on 08/22/2022 08:25:51 -------------------------------------------------------------------------------- Wound Assessment Details Patient Name: Date of Service: [name], [name] 08/22/2022 8:15 A M Medical Record Number: [id number] Patient Account Number: [id number] Date of Birth/Sex: Treating RN: 1955-02-25 (67 y.o. [name], Millard.Loa Primary Care : [name] Other Clinician: Referring : Treating /Extender: Jamse Mead in Treatment: 54 Wound Status Wound Number: 7 Primary Etiology: Venous Leg Ulcer Wound Location: Right, Medial Malleolus Secondary Etiology: Lymphedema Wounding Event:  Blister Wound Status: Open Date Acquired: 07/21/2021 Weeks Of Treatment: 54 Clustered Wound: No Wound Measurements Length: (cm) 3.8 Width: (cm) 1.4 Depth: (cm) 0.2 Area: (cm) 4.178 Volume: (cm) 0.836 % Reduction in Area: 89.1% % Reduction in Volume: 96.9% Wound Description Classification: Full Thickness With Exposed Support Exudate Amount: Medium Exudate Type: Serosanguineous Exudate Color: red, brown Structures Periwound Skin Texture Texture Color No Abnormalities Noted: No No Abnormalities Noted: No Moisture No Abnormalities Noted: No Treatment Notes Wound #7 (Malleolus) Wound Laterality: Right, Medial Cleanser Soap and Water Discharge Instruction: May shower and wash wound with  dial antibacterial soap and water prior to dressing change. Vashe 5.8 (oz) Discharge Instruction: Cleanse the wound with Vashe prior to applying a clean dressing using gauze sponges, not tissue or cotton balls. [name], [name] ([id number]) [id number] Page 3 of 3 Peri-Wound Care Sween Lotion (Moisturizing lotion) Discharge Instruction: Apply moisturizing lotion as directed Topical Gentamicin Discharge Instruction: As directed by physician Primary Dressing Hydrofera Blue Ready Transfer Foam, 2.5x2.5 (in/in) Discharge Instruction: Apply directly to wound bed as directed Secondary Dressing Zetuvit Plus 4x8 in Discharge Instruction: Apply over primary dressing as directed. Secured With Compression Wrap Urgo K2, (equivalent to a 4 layer) two layer compression system, regular Discharge Instruction: Apply Urgo K2 as directed (alternative to 4 layer compression). Compression Stockings Add-Ons Electronic Signature(s) Signed: 08/22/2022 3:26:57 PM By: Shawn Stall RN, BSN Entered By: Shawn Stall on 08/22/2022 08:25:19

## 2022-08-24 ENCOUNTER — Encounter (HOSPITAL_BASED_OUTPATIENT_CLINIC_OR_DEPARTMENT_OTHER): Payer: Medicare Other | Admitting: Physician Assistant

## 2022-08-24 ENCOUNTER — Ambulatory Visit: Payer: Medicare Other | Admitting: Pulmonary Disease

## 2022-08-24 DIAGNOSIS — L97828 Non-pressure chronic ulcer of other part of left lower leg with other specified severity: Secondary | ICD-10-CM | POA: Diagnosis not present

## 2022-08-24 DIAGNOSIS — I890 Lymphedema, not elsewhere classified: Secondary | ICD-10-CM | POA: Diagnosis not present

## 2022-08-24 DIAGNOSIS — M1990 Unspecified osteoarthritis, unspecified site: Secondary | ICD-10-CM | POA: Diagnosis not present

## 2022-08-24 DIAGNOSIS — E1151 Type 2 diabetes mellitus with diabetic peripheral angiopathy without gangrene: Secondary | ICD-10-CM | POA: Diagnosis not present

## 2022-08-24 DIAGNOSIS — Z7901 Long term (current) use of anticoagulants: Secondary | ICD-10-CM | POA: Diagnosis not present

## 2022-08-24 DIAGNOSIS — L97818 Non-pressure chronic ulcer of other part of right lower leg with other specified severity: Secondary | ICD-10-CM | POA: Diagnosis not present

## 2022-08-24 DIAGNOSIS — I87333 Chronic venous hypertension (idiopathic) with ulcer and inflammation of bilateral lower extremity: Secondary | ICD-10-CM | POA: Diagnosis not present

## 2022-08-24 DIAGNOSIS — I2510 Atherosclerotic heart disease of native coronary artery without angina pectoris: Secondary | ICD-10-CM | POA: Diagnosis not present

## 2022-08-24 DIAGNOSIS — L97518 Non-pressure chronic ulcer of other part of right foot with other specified severity: Secondary | ICD-10-CM | POA: Diagnosis not present

## 2022-08-24 DIAGNOSIS — I110 Hypertensive heart disease with heart failure: Secondary | ICD-10-CM | POA: Diagnosis not present

## 2022-08-24 DIAGNOSIS — I509 Heart failure, unspecified: Secondary | ICD-10-CM | POA: Diagnosis not present

## 2022-08-24 DIAGNOSIS — L97312 Non-pressure chronic ulcer of right ankle with fat layer exposed: Secondary | ICD-10-CM | POA: Diagnosis not present

## 2022-08-24 DIAGNOSIS — M109 Gout, unspecified: Secondary | ICD-10-CM | POA: Diagnosis not present

## 2022-08-24 DIAGNOSIS — I872 Venous insufficiency (chronic) (peripheral): Secondary | ICD-10-CM | POA: Diagnosis not present

## 2022-08-24 DIAGNOSIS — E11621 Type 2 diabetes mellitus with foot ulcer: Secondary | ICD-10-CM | POA: Diagnosis not present

## 2022-08-24 NOTE — Progress Notes (Signed)
[name] ([id number]) [id number] Page 1 of 12 Visit Report for 08/24/2022 Chief Complaint Document Details Patient Name: Date of Service: [name], [name] 08/24/2022 3:00 PM Medical Record Number: [id number] Patient Account Number: [id number] Date of Birth/Sex: Treating RN: April 27, 1955 (67 y.o. M) Primary Care Provider: [name] Other Clinician: Referring Provider: Treating Provider/Extender: Alver Fisher in Treatment: 6 Information Obtained from: Patient Chief Complaint 08/04/2021; patient returns to clinic with bilateral leg wounds as well as areas on the right foot Electronic Signature(s) Signed: 08/24/2022 3:16:29 PM By: Allen Derry PA-C Entered By: Allen Derry on 08/24/2022 15:16:28 -------------------------------------------------------------------------------- Debridement Details Patient Name: Date of Service: [name], [name]. 08/24/2022 3:00 PM Medical Record Number: [id number] Patient Account Number: [id number] Date of Birth/Sex: Treating RN: 1955-10-14 (67 y.o. [name], Millard.Loa Primary Care Provider: [name] Other Clinician: Referring Provider: Treating Provider/Extender: Alver Fisher in Treatment: 55 Debridement Performed for Assessment: Wound #7 Right,Medial Malleolus Performed By: Physician Lenda Kelp, PA Debridement Type: Debridement Severity of Tissue Pre Debridement: Fat layer exposed Level of Consciousness (Pre-procedure): Awake and Alert Pre-procedure Verification/Time Out Yes - 15:30 Taken: Start Time: 15:31 Pain Control: Lidocaine 4% T opical Solution Percent of Wound Bed Debrided: 30% T Area Debrided (cm): otal 1.88 Tissue and other material debrided: Viable, Non-Viable, Skin: Dermis , Skin: Epidermis Level: Skin/Epidermis Debridement Description: Selective/Open Wound Instrument: Curette Bleeding: None End Time: 15:36 Procedural Pain: 0 Post Procedural Pain:  0 Response to Treatment: Procedure was tolerated well Level of Consciousness (Post- Awake and Alert procedure): Post Debridement Measurements of Total Wound Length: (cm) 4 Width: (cm) 2 Depth: (cm) 0.2 Volume: (cm) 1.257 Character of Wound/Ulcer Post Debridement: Improved Severity of Tissue Post Debridement: Fat layer exposed [name], [name] ([id number]) [id number] Page 2 of 12 Post Procedure Diagnosis Same as Pre-procedure Electronic Signature(s) Signed: 08/24/2022 5:04:24 PM By: Allen Derry PA-C Signed: 08/24/2022 6:05:41 PM By: Shawn Stall RN, BSN Entered By: Shawn Stall on 08/24/2022 15:37:20 -------------------------------------------------------------------------------- HPI Details Patient Name: Date of Service: [name]. 08/24/2022 3:00 PM Medical Record Number: [id number] Patient Account Number: [id number] Date of Birth/Sex: Treating RN: 11/10/1955 (67 y.o. M) Primary Care Provider: [name] Other Clinician: Referring Provider: Treating Provider/Extender: Alver Fisher in Treatment: 32 History of Present Illness HPI Description: ADMISSION 03/22/2021 This is a 67 year old man with a past medical history significant for diabetes type 2, congestive heart failure, peripheral arterial disease, morbid obesity, venous insufficiency, and coronary artery disease. He has been followed by Dr. Loreta Ave in podiatry, who performed a transmetatarsal amputation on the left foot in August 2022. He had issues healing that wound, but based upon Dr. Kenna Gilbert notes, ultimately the TMA wound healed. During his recovery from that surgery, however, ulcers opened up over the DIP joint of the right second and third toe. These have apparently closed and reopened multiple times. It sounds like one of the issues has been moisture accumulation and maceration of the tissues causing them to reopen. At his last visit with Dr. Loreta Ave, on March 01, 2021, there continues to be problems with moisture and he was referred to wound care for further evaluation and management. He had a formal aortogram with runoff performed prior to his TMA. The findings are copied here: Patient has inline flow to both feet with no significant flow-limiting lesion that would be amenable to percutaneous or open revascularization. He does have an element of small vessel disease and has a  short segment occlusion of the distal anterior tibial/dorsalis pedis artery on the left foot but does have posterior tibial artery flow. Would recommend management of wounds with amputation of toes 2 and 3 on the right foot if the wounds do not heal and deteriorate. Transmetatarsal amputation on the left side has as good a blood supply as it is going to get and hopefully this will heal in the future. Formal ABIs were done in January 2023. They are normal bilaterally. ABI Findings: +---------+------------------+-----+----------+--------+ Right Rt Pressure (mmHg)IndexWaveform Comment  +---------+------------------+-----+----------+--------+ Brachial 160     +---------+------------------+-----+----------+--------+ PTA 192 1.06 monophasic  +---------+------------------+-----+----------+--------+ DP 159 0.88 monophasic  +---------+------------------+-----+----------+--------+ Great T oe145 0.80    +---------+------------------+-----+----------+--------+ +--------+------------------+-----+---------+-------+ Left Lt Pressure (mmHg)IndexWaveform Comment +--------+------------------+-----+---------+-------+ [id number]     +--------+------------------+-----+---------+-------+ PTA 204 1.13 triphasic  +--------+------------------+-----+---------+-------+ DP 194 1.07 biphasic   +--------+------------------+-----+---------+-------+ +-------+-----------+-----------+------------+------------+ ABI/TBIT oday's ABIT oday's TBIPrevious  ABIPrevious TBI +-------+-----------+-----------+------------+------------+ Right 1.06 0.8 1.26 0.65  +-------+-----------+-----------+------------+------------+ Left 1.13 amputation 1.15 amputation  +-------+-----------+-----------+------------+------------+ Previous ABI on 08/06/20 at [facility] Pedal pressures falsely elevated due to medial calcification. [name], [name] ([id number]) [id number] Page 3 of 12 Summary: Right: Resting right ankle-brachial index is within normal range. The right toe-brachial index is normal. Left: Resting left ankle-brachial index is within normal range. READMISSION 08/04/2021 Mr. Valcourt is now a 67 year old man who I remember from this clinic many years ago I think he had a right lower extremity predominantly venous wound at the time. He was here for 1 visit in March of this year had wounds on his right second and third toes we apparently dressed them many and they healed so he did not come back. He is listed in [place] is being a diabetic although the patient denies this says he is verified it with his primary doctor. In any case over the last several weeks or so according the patient although these wounds look somewhat more chronic than that he has developed predominantly large wounds on the right medial and right lateral ankle smaller areas on the left leg and areas on the dorsal aspect of his right second and third toes. Its not clear how he has been dressing these. 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 The patient has known PAD. He had an angiogram in August 2022 at which time he had nonhealing wounds in both feet. On the left his major vessels in the thigh were all patent. He had three-vessel patent to the level of the ankle. He had a very short occlusion in the left anterior tibial. On the right lower extremity the major vessels in  his thigh were all patent. He had three-vessel runoff to the foot sluggish filling of the anterior tibial artery. He was felt to have some component of small vessel disease but nothing that was amenable or needed revascularization. It was recommended that he have amputation of the second and third toes on the right foot if they did not heal He has been following with Dr. Loreta Ave of podiatry. Dr. Loreta Ave got him juxta lite stockings although I do not think he had them on properly he has uncontrolled edema in both legs Past medical history includes type 2 diabetes [although the patient really denies this], left TMA in 2022,lower extremity wounds in fact attendance at this clinic in 2009-2010, A-fib on Eliquis, chronic venous insufficiency with secondary lymphedema history of non-Hodgkin's lymphoma. 08-11-2021 upon evaluation today patient presents for follow-up evaluation  he was seen last Wednesday initially for inspection here in our clinic. With that being said he tells me that he unfortunately has been having a lot of drainage and is actually coming through his wrap. Fortunately I do not see any evidence of active infection locally or systemically at this time which is great news. No fevers, chills, nausea, vomiting, or diarrhea. With that being said there does appear to be some evidence of local infection based on what I am seeing today. 08-18-2021 upon evaluation today patient appears to be doing okay currently in regard to his wounds with that being said that he is doing much better but still has a long ways to go to get to where he wanted to be. I think the infection is significantly improved. He has another week of the antibiotic at this point. 08-25-2021 upon evaluation today patient's wounds are actually doing decently well he has erythema has significantly improved. I think the cellulitis is under controlled I am going to continue him on 2 more weeks of the Levaquin at 500 mg this is a lower dose but  I am hoping it will be better for him. 09-08-2021 upon evaluation today patient appears to be doing excellent in regard to his wounds. Since I last saw him he was actually in the hospital where he ended up having a pacemaker put in. Subsequently he tells me that he is actually doing quite well although they were unsure whether they were going to do it due to the fact that he had the wounds on his legs. And then I am glad they did anything seem to be doing well. 09-15-2021 upon evaluation patient's wounds are actually showing signs of improvement. The right side wounds do appear to have some need for sharp debridement today and I Ernie Hew go ahead and proceed with that. I think that if we get the wounds cleaned up he will actually show signs of continued improvement. I am also leaning towards switching to [facility] which I think will be a much better option for him. 09-22-2021 patient's wounds are showing signs of excellent improvement. I am actually extremely pleased with where we stand and I think that the patient is making great progress. There does not appear to be any signs of active infection. 09-29-2021 upon evaluation today patient appears to be doing excellent in regard to his wounds. He is actually tolerating the dressing changes without complication. Fortunately I see no evidence of active infection locally or systemically at this time which is great news and overall I am extremely pleased with where we stand currently. 06-2128 upon evaluation today patient actually appears to be doing excellent in regard to his wounds. The left leg is almost completely healed the right ankle is significantly smaller. Overall I am extremely pleased with where we stand at this point. I do not see any evidence of active infection at this time. 10-13-2021 upon evaluation today patient appears to be doing excellent in regard to his wounds. I really feel like he is making good progress here and I am very pleased in  that regard. Fortunately I do not see any signs of active infection at this time. We are using the [facility] topical antibiotic therapy. 10-20-2021 upon evaluation today patient appears to be doing well with regard to his wound on the right medial ankle region the left leg is almost completely healed. I am actually very pleased with where we stand today. 11-03-2021 upon evaluation today patient's wound actually is going  require some sharp debridement but appears to be doing much better which is great news. Fortunately I do not see any signs of active infection at this time. 11-10-2021 upon evaluation today patient's wound is actually showing signs of excellent improvement. Fortunately I do not see any evidence of infection locally or systemically which is great news and overall I am extremely pleased with where we stand today. I do believe he is making good progress he does have his [facility] topical antibiotics with him here today. 11-24-2021 upon evaluation today patient actually showing signs of excellent improvement this appears to be doing much better. Fortunately I do not see any evidence of infection locally or systemically at this time. 12-01-2021 upon evaluation today patient appears to be doing well currently in regard to his wound. He has been tolerating the dressing changes without complication. Fortunately I do not see any evidence of active infection at this time which is great news and overall I am extremely pleased with where we stand today. 12-15-2021 upon evaluation today patient appears to be doing well with regard to his wound. He is showing signs of improvement is slow but nonetheless we are making improvements here. Fortunately I do not see any evidence of active infection locally nor systemically at this time. We are still using the [facility] topical antibiotic over the open area only. 12-29-2021 upon evaluation today patient's wound actually is showing signs of excellent  improvement. It has been 2 weeks since I perform any debridement and it definitely shows he has some tissue that needs to be cleaned away but I think we can do so quite easily and readily today. The good news is I do not see any signs of infection I think he is doing much better in that regard. Overall I am extremely pleased with where we stand. 01-12-2022 upon evaluation today patient appears to be doing well currently in regard to his wound although it is not getting significantly smaller it is also not getting any larger. Fortunately I do not see any evidence of infection locally nor systemically which is great news and overall I am extremely pleased with where things stand currently. 01-19-2022 upon evaluation today patient appears to be doing well currently in regard to his wound. The PolyMem actually seems to have done extremely well for him. Fortunately I do not see any signs of infection locally nor systemically at this time. RYDGE, GREIG ([id number]) [id number] Page 4 of 12 01-26-2022 upon evaluation today patient appears to be making progress. Fortunately there does not appear to be any signs of infection which is great news. No fevers, chills, nausea, vomiting, or diarrhea. With that being said this is very slow to heal and although it is smaller I still feel like we may want to try to do something to speed this up I think that a skin substitute could be beneficial, look into Kerecis. 02-02-2022 upon evaluation today patient's wound is actually showing signs of excellent improvement. I do not see any evidence of infection and overall I think that we are headed in the right direction. Fortunately I think that he is tolerating the dressing changes without complication. 02-09-2022 upon evaluation today patient appears to be doing well currently in regard to his wound. It does look a little bit macerated but fortunately does not appear to be showing any signs of  significant skin breakdown over the macerated area which is good news. Fortunately I do not see any evidence of active infection locally nor systemically  at this point which is great news. 02-23-2022 upon evaluation today patient appears to be doing well currently in regard to his wounds. In fact his area on the ankle is actually showing signs of healing quite nicely. We do have the Apligraf ready today I am hopeful this is going to speed things up and get this closed much more effectively and quickly. Fortunately I do not see any signs of active infection locally nor systemically at this time which is great news. 03-02-2022 upon evaluation today patient appears to be doing excellent in regard to his wound. He is actually been tolerating the dressing changes without complication the wound on the left medial lower extremity is doing quite well and Apligraf seems to have been extremely beneficial for him. I am extremely pleased with where we stand today. 03/16/2022: The wound measurements are smaller today. He has accumulation of a yellow crust around the edges and slough on the surface. There is a musty odor coming from the wound. He has been getting Apligraf. 03-30-2022 upon evaluation today patient appears to be making progress. In regard to his leg ulcer. This is slow but nonetheless the Apligraf has fed things up. Fortunately I do not see any evidence of infection locally nor systemically at this time. No fevers, chills, nausea, vomiting, or diarrhea. Patient is here for Apligraf #4 today. 04-13-2022 upon evaluation today patient appears to be doing a little bit more poorly in regard to his wound. He actually feels like his wrap may have been a little bit tight. Fortunately there does not appear to be any signs of active infection locally nor systemically which is great news and I am pleased in that regard. 04-20-2022 upon evaluation today patient appears to be doing better in regard to his wound from the  standpoint of swelling he is actually lost 33 pounds on torsemide in the past week his leg is significantly smaller compared to what it has been. Fortunately I do not see any signs of active infection locally nor systemically at this time. 04-27-2022 upon evaluation today patient's wound actually is showing signs of erythema and warmth around the edges of the wound I am actually concerned about the possibility of infection. I think we probably need to obtain a wound culture and also can recommend based on what we are seeing that we go ahead and have the patient continue to utilize the compression wrapping which I think has been of benefit. 05-04-2022 upon evaluation today patient appears to be doing well currently in regard to his wound. He has been tolerating the dressing changes without complication. Fortunately there does not appear to be any signs of active infection locally or systemically which is great news. 05-11-2022 upon evaluation today patient actually appears to be doing excellent. He has been tolerating the dressing changes we actually were trialing a new medication on him last week and this was the ergo clean Ag dressing. With that being said this actually seems to have done extremely well at this point. Fortunately I do not see any signs of active infection locally or systemically at this time. 05-18-2022 upon evaluation today patient appears to be doing okay in regard to his wound I do not see any signs of worsening also notes any signs of significant improvement. Fortunately I do believe that removing in the right direction in general. 05-25-22 upon evaluation today patient appears to be doing well currently in regard to his wound. He has been tolerating the dressing changes without complication. Fortunately  I do not see any signs of active infection locally nor systemically which is great news and overall I am extremely pleased with where we stand currently. There does not appear to be any  signs of active infection locally nor systemically which is great news. No fevers, chills, nausea, vomiting, or diarrhea. 06-01-2022 upon evaluation today patient actually appears to be making excellent progress in regard to his ankle region. I am very pleased with where we stand I think that he is doing extremely well and very pleased with endoform and drawtex combination. 06-08-2022 upon evaluation today patient appears to be doing well currently in regard to his wound. He has been making good progress and I feel like practicing improvement in the overall size of the wound. I am very pleased in that regard. Fortunately I do not see any signs of active infection locally nor systemically which is great news. No fevers, chills, nausea, vomiting, or diarrhea. 06-15-2022 upon evaluation today patient appears to be doing well currently in regard to his wound. He has been tolerating the dressing changes without complication. I do believe the endoform has been extremely helpful for him up to this point. I do not see any signs of active infection locally nor systemically which is great news and in general I do believe that we are making pretty good progress here which is excellent news. 06-22-2022 upon evaluation today patient appears to be doing well currently in regard to his wound. He has been tolerating the dressing changes without complication. Fortunately there does not appear to be any signs of active infection at this time which is great news. I think that he is actually taking good progress with the endoform which is excellent as well. 06-29-2022 upon evaluation patient appears to be doing well currently in regard to his wound. He is actually making good progress towards closure and I am very pleased in that regard. I do not see any evidence of active infection locally nor systemically which is great news and in general I do believe that we are making excellent progress here overall. 07-06-2022 upon  evaluation today patient appears to be doing well currently in regard to his wound. He has been tolerating the dressing changes without complication. The good news is he is slowly getting smaller each time I see him. 07-13-2022 upon evaluation today patient appears to be doing well currently in regard to his wound is slowly showing signs of improvement. Fortunately I do not see any signs of infection at this time. No fevers, chills, nausea, vomiting, or diarrhea. 07-20-2022 upon evaluation today patient appears to be doing well currently in regard to his wound. He has been tolerating the dressing changes without complication. Fortunately I do not see any evidence of active infection locally or systemically which is great news and in general I do believe that we are making headway towards complete closure. This is excellent news. With that being said we are preparing for him to be leaving to go out of town in about a week and a half. For that reason we will going to make a few changes to his dressing today in order to give him something that I think will be easier for him to do when he is out of town. 07-27-2022 upon evaluation today patient appears to be doing well currently in regard to his wound. He has been tolerating the dressing changes without complication. Fortunately there does not appear to be any signs of infection. Fortunately there does not appear  to be any signs of infection 08-10-2022 upon evaluation today patient appears to be doing okay in regard to his wound. He actually has been gone for 2 weeks and in the interim unfortunately he did not have result out of the Tegaderm as well as hoping for but nonetheless took good care of his wound. Does not appear to be infected I do think we need to go back to the compression wrap however which I think would definitely benefit him. Fortunately I do not see any signs of active infection at this time which is great news. [name], [name] ([id number])  [id number] Page 5 of 12 08-17-2022 upon evaluation today patient unfortunately appears to be showing signs of infection based on what I see at this time. Fortunately I do not see any evidence of systemic infection though locally I do definitely see some changes that are concerning. I do not think that this is systemic at all. 08-24-2022 upon evaluation today patient appears to be doing well currently in regard to his wound. He has been tolerating the dressing changes this looks significantly improved compared to last week and very pleased. I do not see any signs of active infection locally or systemically at this time. Electronic Signature(s) Signed: 08/24/2022 3:49:01 PM By: Allen Derry PA-C Entered By: Allen Derry on 08/24/2022 15:49:01 -------------------------------------------------------------------------------- Physical Exam Details Patient Name: Date of Service: [name], [name]. 08/24/2022 3:00 PM Medical Record Number: [id number] Patient Account Number: [id number] Date of Birth/Sex: Treating RN: 08/25/1955 (67 y.o. M) Primary Care Provider: [name] Other Clinician: Referring Provider: Treating Provider/Extender: Alver Fisher in Treatment: 2 Constitutional Chronically ill appearing but in no apparent acute distress. Respiratory normal breathing without difficulty. Psychiatric this patient is able to make decisions and demonstrates good insight into disease process. Alert and Oriented x 3. pleasant and cooperative. Notes Upon inspection patient's wound bed actually showed signs of good granulation epithelization at this point. Fortunately I do not see any evidence of worsening overall I do believe that the patient is making excellent headway towards complete closure which is great news. I did have to perform a lot of debridement but is mainly the skin around the edges #1 to clean this up he tolerated this with only minimal  bleeding postdebridement this is significantly improved as well. Electronic Signature(s) Signed: 08/24/2022 3:49:19 PM By: Allen Derry PA-C Entered By: Allen Derry on 08/24/2022 15:49:19 -------------------------------------------------------------------------------- Physician Orders Details Patient Name: Date of Service: [name], [name]. 08/24/2022 3:00 PM Medical Record Number: [id number] Patient Account Number: [id number] Date of Birth/Sex: Treating RN: [birth year] (67 y.o. [name], Millard.Loa Primary Care Provider: [name] Other Clinician: Referring Provider: Treating Provider/Extender: Alver Fisher in Treatment: 83 Verbal / Phone Orders: No Diagnosis Coding ICD-10 Coding Code Description I89.0 Lymphedema, not elsewhere classified I87.333 Chronic venous hypertension (idiopathic) with ulcer and inflammation of bilateral lower extremity L97.828 Non-pressure chronic ulcer of other part of left lower leg with other specified severity L97.818 Non-pressure chronic ulcer of other part of right lower leg with other specified severity E11.621 Type 2 diabetes mellitus with foot ulcer L97.518 Non-pressure chronic ulcer of other part of right foot with other specified severity SEIYA, BORKE [name] ([id number]) [id number] Page 6 of 12 Follow-up Appointments ppointment in 1 week. Leonard Schwartz Wednesday 08/31/2022 Return A Return appointment in 3 weeks. Leonard Schwartz Wednesday 09/14/2022 Return appointment in 1 month. - Hot Wednesday 09/21/2022 Nurse Visit: - ****Wednesday 09/07/2022***** Anesthetic (In clinic) Topical Lidocaine  5% applied to wound bed - Used in Clinic Prior to debridement Cellular or Tissue Based Products Cellular or Tissue Based Product Type: - Run IVR for Kerrecis- denied #1 Apligraf applied 02/23/2022 #2 Apligraf applied 03/02/2022 #3 Apligraf applied 03/16/2022 #4 Apligraf applied 03/30/2022 HOLD APLIGRAF this week. Cellular or Tissue Based  Product applied to wound bed, secured with steri-strips, cover with Adaptic or Mepitel. (DO NOT REMOVE). Bathing/ Shower/ Hygiene May shower with protection but do not get wound dressing(s) wet. Protect dressing(s) with water repellant cover (for example, large plastic bag) or a cast cover and may then take shower. Edema Control - Lymphedema / SCD / Other Elevate legs to the level of the heart or above for 30 minutes daily and/or when sitting for 3-4 times a day throughout the day. Avoid standing for long periods of time. Patient to wear own compression stockings every day. - apply in the morning and remove at night left leg. Exercise regularly Moisturize legs daily. - apply every night before bed. Compression stocking or Garment 30-40 mm/Hg pressure to: - wear your VIVE compression stocking to left leg. apply in the morning and remove at night. Wound Treatment Wound #7 - Malleolus Wound Laterality: Right, Medial Cleanser: Soap and Water 1 x Per Week/30 Days Discharge Instructions: May shower and wash wound with dial antibacterial soap and water prior to dressing change. Cleanser: Vashe 5.8 (oz) 1 x Per Week/30 Days Discharge Instructions: Cleanse the wound with Vashe prior to applying a clean dressing using gauze sponges, not tissue or cotton balls. Peri-Wound Care: Sween Lotion (Moisturizing lotion) 1 x Per Week/30 Days Discharge Instructions: Apply moisturizing lotion as directed Topical: Gentamicin 1 x Per Week/30 Days Discharge Instructions: As directed by physician Prim Dressing: Hydrofera Blue Ready Transfer Foam, 2.5x2.5 (in/in) 1 x Per Week/30 Days ary Discharge Instructions: Apply directly to wound bed as directed Secondary Dressing: Zetuvit Plus 4x8 in 1 x Per Week/30 Days Discharge Instructions: Apply over primary dressing as directed. Compression Wrap: Urgo K2, (equivalent to a 4 layer) two layer compression system, regular 1 x Per Week/30 Days Discharge Instructions: Apply  Urgo K2 as directed (alternative to 4 layer compression). Electronic Signature(s) Signed: 08/24/2022 5:04:24 PM By: Allen Derry PA-C Signed: 08/24/2022 6:05:41 PM By: Shawn Stall RN, BSN Entered By: Shawn Stall on 08/24/2022 15:38:27 -------------------------------------------------------------------------------- Problem List Details Patient Name: Date of Service: [name], [name]. 08/24/2022 3:00 PM Medical Record Number: [id number] Patient Account Number: [id number] Date of Birth/Sex: Treating RN: 1955/03/21 (67 y.o. M) Primary Care Provider: [name] Other Clinician: Referring Provider: Treating Provider/Extender: Alver Fisher in Treatment: [address] [place] [name] ([id number]) [phone].pdf Page 7 of 12 Active Problems ICD-10 Encounter Code Description Active Date MDM Diagnosis I89.0 Lymphedema, not elsewhere classified 08/04/2021 No Yes I87.333 Chronic venous hypertension (idiopathic) with ulcer and inflammation of 08/04/2021 No Yes bilateral lower extremity L97.828 Non-pressure chronic ulcer of other part of left lower leg with other specified 08/04/2021 No Yes severity L97.818 Non-pressure chronic ulcer of other part of right lower leg with other specified 08/04/2021 No Yes severity E11.621 Type 2 diabetes mellitus with foot ulcer 08/04/2021 No Yes L97.518 Non-pressure chronic ulcer of other part of right foot with other specified 08/04/2021 No Yes severity Inactive Problems Resolved Problems Electronic Signature(s) Signed: 08/24/2022 3:16:14 PM By: Allen Derry PA-C Entered By: Allen Derry on 08/24/2022 15:16:13 -------------------------------------------------------------------------------- Progress Note Details Patient Name: Date of Service: [name], [name]. 08/24/2022 3:00 PM Medical Record Number: [id number] Patient Account  Number: [id number] Date of Birth/Sex: Treating RN: [birth year] (67 y.o. M) Primary Care Provider: [name] Other Clinician: Referring Provider: Treating Provider/Extender: Alver Fisher in Treatment: 30 Subjective Chief Complaint Information obtained from Patient 08/04/2021; patient returns to clinic with bilateral leg wounds as well as areas on the right foot History of Present Illness (HPI) ADMISSION 03/22/2021 This is a 67 year old man with a past medical history significant for diabetes type 2, congestive heart failure, peripheral arterial disease, morbid obesity, venous insufficiency, and coronary artery disease. He has been followed by Dr. Loreta Ave in podiatry, who performed a transmetatarsal amputation on the left foot in August 2022. He had issues healing that wound, but based upon Dr. Kenna Gilbert notes, ultimately the TMA wound healed. During his recovery from that surgery, however, ulcers opened up over the DIP joint of the right second and third toe. These have apparently closed and reopened multiple times. It sounds like one of the issues has been moisture accumulation and maceration of the tissues causing them to reopen. At his last visit with Dr. Loreta Ave, on March 01, 2021, there continues to be problems with moisture and he was referred to wound care for further evaluation and management. He had a formal aortogram with runoff performed prior to his TMA. The findings are copied hereAMASA, MCKECHNIE ([id number]) [id number] Page 8 of 12 Patient has inline flow to both feet with no significant flow-limiting lesion that would be amenable to percutaneous or open revascularization. He does have an element of small vessel disease and has a short segment occlusion of the distal anterior tibial/dorsalis pedis artery on the left foot but does have posterior tibial artery flow. Would recommend management of wounds with amputation of toes 2 and 3 on the right foot if the wounds do not heal and deteriorate. Transmetatarsal amputation on the left  side has as good a blood supply as it is going to get and hopefully this will heal in the future. Formal ABIs were done in January 2023. They are normal bilaterally. ABI Findings: +---------+------------------+-----+----------+--------+ Right Rt Pressure (mmHg)IndexWaveform Comment  +---------+------------------+-----+----------+--------+ Brachial 160    +---------+------------------+-----+----------+--------+ PTA 192 1.06 monophasic  +---------+------------------+-----+----------+--------+ DP 159 0.88 monophasic  +---------+------------------+-----+----------+--------+ Great T oe145 0.80    +---------+------------------+-----+----------+--------+ +--------+------------------+-----+---------+-------+ Left Lt Pressure (mmHg)IndexWaveform Comment +--------+------------------+-----+---------+-------+ [id number]    +--------+------------------+-----+---------+-------+ PTA 204 1.13 triphasic  +--------+------------------+-----+---------+-------+ DP 194 1.07 biphasic   +--------+------------------+-----+---------+-------+ +-------+-----------+-----------+------------+------------+ ABI/TBIT oday's ABIT oday's TBIPrevious ABIPrevious TBI +-------+-----------+-----------+------------+------------+ Right 1.06 0.8 1.26 0.65  +-------+-----------+-----------+------------+------------+ Left 1.13 amputation 1.15 amputation  +-------+-----------+-----------+------------+------------+ Previous ABI on 08/06/20 at [facility] Pedal pressures falsely elevated due to medial calcification. Summary: Right: Resting right ankle-brachial index is within normal range. The right toe-brachial index is normal. Left: Resting left ankle-brachial index is within normal range. READMISSION 08/04/2021 Mr. Deadmon is now a 67 year old man who I remember from this clinic many years ago I think he had a right lower extremity predominantly venous  wound at the time. He was here for 1 visit in March of this year had wounds on his right second and third toes we apparently dressed them many and they healed so he did not come back. He is listed in [place] is being a diabetic although the patient denies this says he is verified it with his primary doctor. In any case over the last several weeks or so according the patient although these wounds look somewhat more chronic than that he has developed predominantly large wounds on the right medial and right  lateral ankle smaller areas on the left leg and areas on the dorsal aspect of his right second and third toes. Its not clear how he has been dressing these. 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 The patient has known PAD. He had an angiogram in August 2022 at which time he had nonhealing wounds in both feet. On the left his major vessels in the thigh were all patent. He had three-vessel patent to the level of the ankle. He had a very short occlusion in the left anterior tibial. On the right lower extremity the major vessels in his thigh were all patent. He had three-vessel runoff to the foot sluggish filling of the anterior tibial artery. He was felt to have some component of small vessel disease but nothing that was amenable or needed revascularization. It was recommended that he have amputation of the second and third toes on the right foot if they did not heal He has been following with Dr. Loreta Ave of podiatry. Dr. Loreta Ave got him juxta lite stockings although I do not think he had them on properly he has uncontrolled edema in both legs Past medical history includes type 2 diabetes [although the patient really denies this], left TMA in 2022,lower extremity wounds in fact attendance at this clinic in 2009-2010, A-fib on Eliquis, chronic venous insufficiency with secondary lymphedema history of non-Hodgkin's  lymphoma. 08-11-2021 upon evaluation today patient presents for follow-up evaluation he was seen last Wednesday initially for inspection here in our clinic. With that being said he tells me that he unfortunately has been having a lot of drainage and is actually coming through his wrap. Fortunately I do not see any evidence of active infection locally or systemically at this time which is great news. No fevers, chills, nausea, vomiting, or diarrhea. With that being said there does appear to be some evidence of local infection based on what I am seeing today. 08-18-2021 upon evaluation today patient appears to be doing okay currently in regard to his wounds with that being said that he is doing much better but still has a long ways to go to get to where he wanted to be. I think the infection is significantly improved. He has another week of the antibiotic at this point. 08-25-2021 upon evaluation today patient's wounds are actually doing decently well he has erythema has significantly improved. I think the cellulitis is under controlled I am going to continue him on 2 more weeks of the Levaquin at 500 mg this is a lower dose but I am hoping it will be better for him. 09-08-2021 upon evaluation today patient appears to be doing excellent in regard to his wounds. Since I last saw him he was actually in the hospital where he ended up having a pacemaker put in. Subsequently he tells me that he is actually doing quite well although they were unsure whether they were going to do it due to the fact that he had the wounds on his legs. And then I am glad they did anything seem to be doing well. 09-15-2021 upon evaluation patient's wounds are actually showing signs of improvement. The right side wounds do appear to have some need for sharp debridement today and I Ernie Hew go ahead and proceed with that. I think that if we get the wounds cleaned up he will actually show signs of continued improvement. I am also leaning  towards switching to [facility]  which I think will be a much better option for him. [name], [name] ([id number]) [id number] Page 9 of 12 09-22-2021 patient's wounds are showing signs of excellent improvement. I am actually extremely pleased with where we stand and I think that the patient is making great progress. There does not appear to be any signs of active infection. 09-29-2021 upon evaluation today patient appears to be doing excellent in regard to his wounds. He is actually tolerating the dressing changes without complication. Fortunately I see no evidence of active infection locally or systemically at this time which is great news and overall I am extremely pleased with where we stand currently. [id number] upon evaluation today patient actually appears to be doing excellent in regard to his wounds. The left leg is almost completely healed the right ankle is significantly smaller. Overall I am extremely pleased with where we stand at this point. I do not see any evidence of active infection at this time. 10-13-2021 upon evaluation today patient appears to be doing excellent in regard to his wounds. I really feel like he is making good progress here and I am very pleased in that regard. Fortunately I do not see any signs of active infection at this time. We are using the [facility] topical antibiotic therapy. 10-20-2021 upon evaluation today patient appears to be doing well with regard to his wound on the right medial ankle region the left leg is almost completely healed. I am actually very pleased with where we stand today. 11-03-2021 upon evaluation today patient's wound actually is going require some sharp debridement but appears to be doing much better which is great news. Fortunately I do not see any signs of active infection at this time. 11-10-2021 upon evaluation today patient's wound is actually showing signs of excellent improvement. Fortunately I do not see any  evidence of infection locally or systemically which is great news and overall I am extremely pleased with where we stand today. I do believe he is making good progress he does have his [facility] topical antibiotics with him here today. 11-24-2021 upon evaluation today patient actually showing signs of excellent improvement this appears to be doing much better. Fortunately I do not see any evidence of infection locally or systemically at this time. 12-01-2021 upon evaluation today patient appears to be doing well currently in regard to his wound. He has been tolerating the dressing changes without complication. Fortunately I do not see any evidence of active infection at this time which is great news and overall I am extremely pleased with where we stand today. 12-15-2021 upon evaluation today patient appears to be doing well with regard to his wound. He is showing signs of improvement is slow but nonetheless we are making improvements here. Fortunately I do not see any evidence of active infection locally nor systemically at this time. We are still using the [facility] topical antibiotic over the open area only. 12-29-2021 upon evaluation today patient's wound actually is showing signs of excellent improvement. It has been 2 weeks since I perform any debridement and it definitely shows he has some tissue that needs to be cleaned away but I think we can do so quite easily and readily today. The good news is I do not see any signs of infection I think he is doing much better in that regard. Overall I am extremely pleased with where we stand. 01-12-2022 upon evaluation today patient appears to be doing well currently in regard to his wound although it is not getting  significantly smaller it is also not getting any larger. Fortunately I do not see any evidence of infection locally nor systemically which is great news and overall I am extremely pleased with where things stand currently. 01-19-2022 upon  evaluation today patient appears to be doing well currently in regard to his wound. The PolyMem actually seems to have done extremely well for him. Fortunately I do not see any signs of infection locally nor systemically at this time. 01-26-2022 upon evaluation today patient appears to be making progress. Fortunately there does not appear to be any signs of infection which is great news. No fevers, chills, nausea, vomiting, or diarrhea. With that being said this is very slow to heal and although it is smaller I still feel like we may want to try to do something to speed this up I think that a skin substitute could be beneficial, look into Kerecis. 02-02-2022 upon evaluation today patient's wound is actually showing signs of excellent improvement. I do not see any evidence of infection and overall I think that we are headed in the right direction. Fortunately I think that he is tolerating the dressing changes without complication. 02-09-2022 upon evaluation today patient appears to be doing well currently in regard to his wound. It does look a little bit macerated but fortunately does not appear to be showing any signs of significant skin breakdown over the macerated area which is good news. Fortunately I do not see any evidence of active infection locally nor systemically at this point which is great news. 02-23-2022 upon evaluation today patient appears to be doing well currently in regard to his wounds. In fact his area on the ankle is actually showing signs of healing quite nicely. We do have the Apligraf ready today I am hopeful this is going to speed things up and get this closed much more effectively and quickly. Fortunately I do not see any signs of active infection locally nor systemically at this time which is great news. 03-02-2022 upon evaluation today patient appears to be doing excellent in regard to his wound. He is actually been tolerating the dressing changes without complication the wound  on the left medial lower extremity is doing quite well and Apligraf seems to have been extremely beneficial for him. I am extremely pleased with where we stand today. 03/16/2022: The wound measurements are smaller today. He has accumulation of a yellow crust around the edges and slough on the surface. There is a musty odor coming from the wound. He has been getting Apligraf. 03-30-2022 upon evaluation today patient appears to be making progress. In regard to his leg ulcer. This is slow but nonetheless the Apligraf has fed things up. Fortunately I do not see any evidence of infection locally nor systemically at this time. No fevers, chills, nausea, vomiting, or diarrhea. Patient is here for Apligraf #4 today. 04-13-2022 upon evaluation today patient appears to be doing a little bit more poorly in regard to his wound. He actually feels like his wrap may have been a little bit tight. Fortunately there does not appear to be any signs of active infection locally nor systemically which is great news and I am pleased in that regard. 04-20-2022 upon evaluation today patient appears to be doing better in regard to his wound from the standpoint of swelling he is actually lost 33 pounds on torsemide in the past week his leg is significantly smaller compared to what it has been. Fortunately I do not see any signs of active  infection locally nor systemically at this time. 04-27-2022 upon evaluation today patient's wound actually is showing signs of erythema and warmth around the edges of the wound I am actually concerned about the possibility of infection. I think we probably need to obtain a wound culture and also can recommend based on what we are seeing that we go ahead and have the patient continue to utilize the compression wrapping which I think has been of benefit. 05-04-2022 upon evaluation today patient appears to be doing well currently in regard to his wound. He has been tolerating the dressing changes  without complication. Fortunately there does not appear to be any signs of active infection locally or systemically which is great news. 05-11-2022 upon evaluation today patient actually appears to be doing excellent. He has been tolerating the dressing changes we actually were trialing a new medication on him last week and this was the ergo clean Ag dressing. With that being said this actually seems to have done extremely well at this point. Fortunately I do not see any signs of active infection locally or systemically at this time. 05-18-2022 upon evaluation today patient appears to be doing okay in regard to his wound I do not see any signs of worsening also notes any signs of significant improvement. Fortunately I do believe that removing in the right direction in general. [name], [name] ([id number]) [id number] Page 10 of 12 05-25-22 upon evaluation today patient appears to be doing well currently in regard to his wound. He has been tolerating the dressing changes without complication. Fortunately I do not see any signs of active infection locally nor systemically which is great news and overall I am extremely pleased with where we stand currently. There does not appear to be any signs of active infection locally nor systemically which is great news. No fevers, chills, nausea, vomiting, or diarrhea. 06-01-2022 upon evaluation today patient actually appears to be making excellent progress in regard to his ankle region. I am very pleased with where we stand I think that he is doing extremely well and very pleased with endoform and drawtex combination. 06-08-2022 upon evaluation today patient appears to be doing well currently in regard to his wound. He has been making good progress and I feel like practicing improvement in the overall size of the wound. I am very pleased in that regard. Fortunately I do not see any signs of active infection locally nor systemically which is  great news. No fevers, chills, nausea, vomiting, or diarrhea. 06-15-2022 upon evaluation today patient appears to be doing well currently in regard to his wound. He has been tolerating the dressing changes without complication. I do believe the endoform has been extremely helpful for him up to this point. I do not see any signs of active infection locally nor systemically which is great news and in general I do believe that we are making pretty good progress here which is excellent news. 06-22-2022 upon evaluation today patient appears to be doing well currently in regard to his wound. He has been tolerating the dressing changes without complication. Fortunately there does not appear to be any signs of active infection at this time which is great news. I think that he is actually taking good progress with the endoform which is excellent as well. 06-29-2022 upon evaluation patient appears to be doing well currently in regard to his wound. He is actually making good progress towards closure and I am very pleased in that regard. I do not see any evidence  of active infection locally nor systemically which is great news and in general I do believe that we are making excellent progress here overall. 07-06-2022 upon evaluation today patient appears to be doing well currently in regard to his wound. He has been tolerating the dressing changes without complication. The good news is he is slowly getting smaller each time I see him. 07-13-2022 upon evaluation today patient appears to be doing well currently in regard to his wound is slowly showing signs of improvement. Fortunately I do not see any signs of infection at this time. No fevers, chills, nausea, vomiting, or diarrhea. 07-20-2022 upon evaluation today patient appears to be doing well currently in regard to his wound. He has been tolerating the dressing changes without complication. Fortunately I do not see any evidence of active infection locally or  systemically which is great news and in general I do believe that we are making headway towards complete closure. This is excellent news. With that being said we are preparing for him to be leaving to go out of town in about a week and a half. For that reason we will going to make a few changes to his dressing today in order to give him something that I think will be easier for him to do when he is out of town. 07-27-2022 upon evaluation today patient appears to be doing well currently in regard to his wound. He has been tolerating the dressing changes without complication. Fortunately there does not appear to be any signs of infection. Fortunately there does not appear to be any signs of infection 08-10-2022 upon evaluation today patient appears to be doing okay in regard to his wound. He actually has been gone for 2 weeks and in the interim unfortunately he did not have result out of the Tegaderm as well as hoping for but nonetheless took good care of his wound. Does not appear to be infected I do think we need to go back to the compression wrap however which I think would definitely benefit him. Fortunately I do not see any signs of active infection at this time which is great news. 08-17-2022 upon evaluation today patient unfortunately appears to be showing signs of infection based on what I see at this time. Fortunately I do not see any evidence of systemic infection though locally I do definitely see some changes that are concerning. I do not think that this is systemic at all. 08-24-2022 upon evaluation today patient appears to be doing well currently in regard to his wound. He has been tolerating the dressing changes this looks significantly improved compared to last week and very pleased. I do not see any signs of active infection locally or systemically at this time. Objective Constitutional Chronically ill appearing but in no apparent acute distress. Vitals Time Taken: 3:06 PM, Height: 71 in,  Weight: 350 lbs, BMI: 48.8, Temperature: 98.2 F, Pulse: 73 bpm, Respiratory Rate: 18 breaths/min, Blood Pressure: 122/79 mmHg. Respiratory normal breathing without difficulty. Psychiatric this patient is able to make decisions and demonstrates good insight into disease process. Alert and Oriented x 3. pleasant and cooperative. General Notes: Upon inspection patient's wound bed actually showed signs of good granulation epithelization at this point. Fortunately I do not see any evidence of worsening overall I do believe that the patient is making excellent headway towards complete closure which is great news. I did have to perform a lot of debridement but is mainly the skin around the edges #1 to clean this up  he tolerated this with only minimal bleeding postdebridement this is significantly improved as well. Integumentary (Hair, Skin) Wound #7 status is Open. Original cause of wound was Blister. The date acquired was: 07/21/2021. The wound has been in treatment 55 weeks. The wound is located on the Right,Medial Malleolus. The wound measures 4cm length x 2cm width x 0.2cm depth; 6.283cm^2 area and 1.257cm^3 volume. There is Fat Layer (Subcutaneous Tissue) exposed. There is no tunneling or undermining noted. There is a medium amount of serosanguineous drainage noted. There is large (67- 100%) red granulation within the wound bed. There is a small (1-33%) amount of necrotic tissue within the wound bed including Adherent Slough. The periwound skin appearance did not exhibit: Callus, Crepitus, Excoriation, Induration, Rash, Scarring, Dry/Scaly, Maceration, Atrophie Blanche, Cyanosis, Ecchymosis, Hemosiderin Staining, Mottled, Pallor, Rubor, Erythema. 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 There was a Selective/Open Wound Skin/Epidermis Debridement with a total area of 1.88 sq cm performed by Lenda Kelp, PA. With the following instrument(s): Curette to remove Viable and Non-Viable tissue/material. Material removed includes Skin: Dermis and Skin: Epidermis and after achieving pain control using Lidocaine 4% T opical Solution. A time out was conducted at 15:30, prior to the start of the procedure. There was no bleeding. The procedure was tolerated well with a pain level of 0 throughout and a pain level of 0 following the procedure. Post Debridement Measurements: 4cm length x 2cm width x 0.2cm depth; 1.257cm^3 volume. Character of Wound/Ulcer Post Debridement is improved. Severity of Tissue Post Debridement is: Fat layer exposed. Post procedure Diagnosis Wound #7: Same as Pre-Procedure Pre-procedure diagnosis of Wound #7 is a Venous Leg Ulcer located on the Right,Medial Malleolus . There was a Double Layer Compression Therapy Procedure by Shawn Stall, RN. Post procedure Diagnosis Wound #7: Same as Pre-Procedure Plan Follow-up Appointments: Return Appointment in 1 week. Leonard Schwartz Wednesday 08/31/2022 Return appointment in 3 weeks. Leonard Schwartz Wednesday 09/14/2022 Return appointment in 1 month. - Hot Wednesday 09/21/2022 Nurse Visit: - ****Wednesday 09/07/2022***** Anesthetic: (In clinic) Topical Lidocaine 5% applied to wound bed - Used in Clinic Prior to debridement Cellular or Tissue Based  Products: Cellular or Tissue Based Product Type: - Run IVR for Kerrecis- denied #1 Apligraf applied 02/23/2022 #2 Apligraf applied 03/02/2022 #3 Apligraf applied 03/16/2022 #4 Apligraf applied 03/30/2022 HOLD APLIGRAF this week. Cellular or Tissue Based Product applied to wound bed, secured with steri-strips, cover with Adaptic or Mepitel. (DO NOT REMOVE). Bathing/ Shower/ Hygiene: May shower with protection but do not get wound dressing(s) wet. Protect dressing(s) with water repellant cover (for example, large plastic bag) or a cast cover and may then take shower. Edema Control - Lymphedema / SCD / Other: Elevate legs to the level of the heart or above for 30 minutes daily and/or when sitting for 3-4 times a day throughout the day. Avoid standing for long periods of time. Patient to wear own compression stockings every day. -  apply in the morning and remove at night left leg. Exercise regularly Moisturize legs daily. - apply every night before bed. Compression stocking or Garment 30-40 mm/Hg pressure to: - wear your VIVE compression stocking to left leg. apply in the morning and remove at night. WOUND #7: - Malleolus Wound Laterality: Right, Medial Cleanser: Soap and Water 1 x Per Week/30 Days Discharge Instructions: May shower and wash wound with dial antibacterial soap and water prior to dressing change. Cleanser: Vashe 5.8 (oz) 1 x Per Week/30 Days Discharge Instructions: Cleanse the wound with Vashe prior to applying a clean dressing using gauze sponges, not tissue or cotton balls. Peri-Wound Care: Sween Lotion (Moisturizing lotion) 1 x Per Week/30 Days Discharge Instructions: Apply moisturizing lotion as directed Topical: Gentamicin 1 x Per Week/30 Days Discharge Instructions: As directed by physician Prim Dressing: Hydrofera Blue Ready Transfer Foam, 2.5x2.5 (in/in) 1 x Per Week/30 Days ary Discharge Instructions: Apply directly to wound bed as directed Secondary Dressing: Zetuvit Plus  4x8 in 1 x Per Week/30 Days Discharge Instructions: Apply over primary dressing as directed. Com pression Wrap: Urgo K2, (equivalent to a 4 layer) two layer compression system, regular 1 x Per Week/30 Days Discharge Instructions: Apply Urgo K2 as directed (alternative to 4 layer compression). 1. I am going to recommend that we have the patient continue to monitor of infection or worsening. Based on what I am seeing I do believe that we will make an excellent headway towards complete closure. 2. I am also can recommend that the patient should continue to for any evidence use the [facility] for this week, followed by next week may be able to switch back over to the endoform which was really doing well for him previous. We will see patient back for reevaluation in 1 week here in the clinic. If anything worsens or changes patient will contact our office for additional recommendations. MAUREEN, HAAGEN ([id number]) [id number] Page 12 of 12 Electronic Signature(s) Signed: 08/24/2022 3:50:00 PM By: Allen Derry PA-C Entered By: Allen Derry on 08/24/2022 15:50:00 -------------------------------------------------------------------------------- SuperBill Details Patient Name: Date of Service: Schroepfer, [name]. 08/24/2022 Medical Record Number: [id number] Patient Account Number: [id number] Date of Birth/Sex: Treating RN: 05/27/1955 (67 y.o. [name], Millard.Loa Primary Care Provider: [name] Other Clinician: Referring Provider: Treating Provider/Extender: Alver Fisher in Treatment: 55 Diagnosis Coding ICD-10 Codes Code Description I89.0 Lymphedema, not elsewhere classified I87.333 Chronic venous hypertension (idiopathic) with ulcer and inflammation of bilateral lower extremity L97.828 Non-pressure chronic ulcer of other part of left lower leg with other specified severity L97.818 Non-pressure chronic ulcer of other part of right lower leg with other  specified severity E11.621 Type 2 diabetes mellitus with foot ulcer L97.518 Non-pressure chronic ulcer of other part of right foot with other specified severity Facility Procedures : 7 CPT4 Code: [id number] Description: 97597 - DEBRIDE WOUND 1ST 20 SQ CM OR < ICD-10 Diagnosis Description L97.818 Non-pressure chronic ulcer of other part of right lower leg with other specified s Modifier: everity Quantity: 1 Physician Procedures : CPT4 Code Description Modifier [id number] - WC PHYS DEBR WO ANESTH 20 SQ CM ICD-10 Diagnosis Description L97.818 Non-pressure chronic ulcer of other part of right lower leg with other specified severity Quantity: 1 Electronic Signature(s) Signed: 08/24/2022 3:50:39 PM By: Allen Derry PA-C Entered By: Allen Derry on 08/24/2022 15:50:38

## 2022-08-26 NOTE — Progress Notes (Signed)
YIANNIS, SERRAO ([id number]) [id number] Page 1 of 6 Visit Report for 08/24/2022 Arrival Information Details Patient Name: Date of Service: [name], [name]. 08/24/2022 3:00 PM Medical Record Number: [id number] Patient Account Number: [id number] Date of Birth/Sex: Treating RN: 03/16/1955 (67 y.o. M) Primary Care Mattheus Rauls: [name] Other Clinician: Referring Shandrea Lusk: Treating Edvin Albus/Extender: Alver Fisher in Treatment: 55 Visit Information History Since Last Visit Added or deleted any medications: No Patient Arrived: [name] Any new allergies or adverse reactions: No Arrival Time: 14:55 Had a fall or experienced change in No Accompanied By: self activities of daily living that may affect Transfer Assistance: None risk of falls: Patient Identification Verified: Yes Signs or symptoms of abuse/neglect since last visito No Secondary Verification Process Completed: Yes Hospitalized since last visit: No Patient Requires Transmission-Based Precautions: No Implantable device outside of the clinic excluding No Patient Has Alerts: Yes cellular tissue based products placed in the center Patient Alerts: Patient on Blood Thinner since last visit: 01/2021 ABI L 1.13 R 1.06 Has Dressing in Place as Prescribed: Yes 01/2021 TBI L amp R 0.8 Has Compression in Place as Prescribed: Yes Pain Present Now: No Electronic Signature(s) Signed: 08/26/2022 12:35:38 PM By: Thayer Dallas Entered By: Thayer Dallas on 08/24/2022 15:05:19 -------------------------------------------------------------------------------- Compression Therapy Details Patient Name: Date of Service: [name], [name] [name] 08/24/2022 3:00 PM Medical Record Number: [id number] Patient Account Number: [id number] Date of Birth/Sex: Treating RN: [birth year] (67 y.o. [name] Primary Care Eleftherios Dudenhoeffer: [name] Other Clinician: Referring Jeannifer Drakeford: Treating Charbel Los/Extender: Alver Fisher in Treatment: 55 Compression Therapy Performed for Wound Assessment: Wound #7 Right,Medial Malleolus Performed By: Clinician Shawn Stall, RN Compression Type: Double Layer Post Procedure Diagnosis Same as Pre-procedure Electronic Signature(s) Signed: 08/24/2022 6:05:41 PM By: Shawn Stall RN, BSN Entered By: Shawn Stall on 08/24/2022 15:34:29 Bies, Kejuan D ([id number]) [id number] Page 2 of 6 -------------------------------------------------------------------------------- Encounter Discharge Information Details Patient Name: Date of Service: [name], [name] 08/24/2022 3:00 PM Medical Record Number: [id number] Patient Account Number: [id number] Date of Birth/Sex: Treating RN: [birth year] (67 y.o. [name] Primary Care Hesper Venturella: [name] Other Clinician: Referring Madalen Gavin: Treating Ludmilla Mcgillis/Extender: Alver Fisher in Treatment: 51 Encounter Discharge Information Items Post Procedure Vitals Discharge Condition: Stable Temperature (F): 98.2 Ambulatory Status: Walker Pulse (bpm): 73 Discharge Destination: Home Respiratory Rate (breaths/min): 20 Transportation: Private Auto Blood Pressure (mmHg): 122/79 Accompanied By: self Schedule Follow-up Appointment: Yes Clinical Summary of Care: Electronic Signature(s) Signed: 08/24/2022 6:05:41 PM By: Shawn Stall RN, BSN Entered By: Shawn Stall on 08/24/2022 15:40:35 -------------------------------------------------------------------------------- Lower Extremity Assessment Details Patient Name: Date of Service: [name], [name] [name] 08/24/2022 3:00 PM Medical Record Number: [id number] Patient Account Number: [id number] Date of Birth/Sex: Treating RN: 1956/01/05 (67 y.o. M) Primary Care Royetta Probus: [name] Other Clinician: Referring Casondra Gasca: Treating Zahira Brummond/Extender: Alver Fisher in Treatment: 55 Edema Assessment Assessed:  [Left: No] [Right: No] Edema: [Left: N] [Right: o] Calf Left: Right: Point of Measurement: 31 cm From Medial Instep 39 cm Ankle Left: Right: Point of Measurement: 11 cm From Medial Instep 26 cm Vascular Assessment Extremity colors, hair growth, and conditions: Extremity Color: [Right:Normal] Hair Growth on Extremity: [Right:No] Temperature of Extremity: [Right:Warm] Capillary Refill: [Right:< 3 seconds] Dependent Rubor: [Right:No Yes] Electronic Signature(s) Signed: 08/26/2022 12:35:38 PM By: Thayer Dallas Entered By: Thayer Dallas on 08/24/2022 15:07:41 Brafford, Huxton D ([id number]) [id number] Page 3 of 6 -------------------------------------------------------------------------------- Multi-Disciplinary Care Plan Details Patient Name: Date of  Service: [name], [name]. 08/24/2022 3:00 PM Medical Record Number: [id number] Patient Account Number: [id number] Date of Birth/Sex: Treating RN: February 15, 1955 (67 y.o. [name] Primary Care Reiko Vinje: [name] Other Clinician: Referring Kyleeann Cremeans: Treating Keyaira Clapham/Extender: Alver Fisher in Treatment: 81 Multidisciplinary Care Plan reviewed with physician Active Inactive Pain, Acute or Chronic Nursing Diagnoses: Pain, acute or chronic: actual or potential Potential alteration in comfort, pain Goals: Patient will verbalize adequate pain control and receive pain control interventions during procedures as needed Date Initiated: 08/04/2021 Target Resolution Date: 09/10/2022 Goal Status: Active Patient/caregiver will verbalize comfort level met Date Initiated: 08/04/2021 Date Inactivated: 07/27/2022 Target Resolution Date: 09/10/2022 Goal Status: Met Interventions: Encourage patient to take pain medications as prescribed Provide education on pain management Reposition patient for comfort Treatment Activities: Administer pain control measures as ordered : 08/04/2021 Notes: Electronic  Signature(s) Signed: 08/24/2022 6:05:41 PM By: Shawn Stall RN, BSN Entered By: Shawn Stall on 08/24/2022 15:34:49 -------------------------------------------------------------------------------- Pain Assessment Details Patient Name: Date of Service: [name], [name] 08/24/2022 3:00 PM Medical Record Number: [id number] Patient Account Number: [id number] Date of Birth/Sex: Treating RN: [birth year] (67 y.o. M) Primary Care Brayson Livesey: [name] Other Clinician: Referring Marietta Sikkema: Treating Abem Shaddix/Extender: Alver Fisher in Treatment: 55 Active Problems Location of Pain Severity and Description of Pain Patient Has Paino No Site Locations JAHRED, HEFFERON D ([id number]) [id number] Page 4 of 6 Pain Management and Medication Current Pain Management: Electronic Signature(s) Signed: 08/26/2022 12:35:38 PM By: Thayer Dallas Entered By: Thayer Dallas on 08/24/2022 15:07:27 -------------------------------------------------------------------------------- Patient/Caregiver Education Details Patient Name: Date of Service: [name], [name] 8/14/2024andnbsp3:00 PM Medical Record Number: [id number] Patient Account Number: [id number] Date of Birth/Gender: Treating RN: 12/08/1955 (67 y.o. [name] Primary Care Physician: [name] Other Clinician: Referring Physician: Treating Physician/Extender: Alver Fisher in Treatment: 15 Education Assessment Education Provided To: Patient Education Topics Provided Wound/Skin Impairment: Handouts: Caring for Your Ulcer Methods: Explain/Verbal Responses: Reinforcements needed Electronic Signature(s) Signed: 08/24/2022 6:05:41 PM By: Shawn Stall RN, BSN Entered By: Shawn Stall on 08/24/2022 15:36:09 -------------------------------------------------------------------------------- Wound Assessment Details Patient Name: Date of Service: [name], [name] 08/24/2022 3:00  PM Medical Record Number: [id number] Patient Account Number: [id number] Date of Birth/Sex: Treating RN: 1955-06-10 (67 y.o. M) Primary Care Cutberto Winfree: [name] Other Clinician: BRENNDEN, [name] ([id number]) [id number] Page 5 of 6 Referring Marcellus Pulliam: Treating Jeromy Borcherding/Extender: Alver Fisher in Treatment: 55 Wound Status Wound Number: 7 Primary Venous Leg Ulcer Etiology: Wound Location: Right, Medial Malleolus Secondary Lymphedema Wounding Event: Blister Etiology: Date Acquired: 07/21/2021 Wound Open Weeks Of Treatment: 55 Status: Clustered Wound: No Comorbid Sleep Apnea, Arrhythmia, Congestive Heart Failure, History: Hypertension, Hypotension, Peripheral Arterial Disease, Peripheral Venous Disease, Type II Diabetes, Gout, Osteoarthritis Photos Wound Measurements Length: (cm) 4 Width: (cm) 2 Depth: (cm) 0.2 Area: (cm) 6.283 Volume: (cm) 1.257 % Reduction in Area: 83.6% % Reduction in Volume: 95.3% Epithelialization: Small (1-33%) Tunneling: No Undermining: No Wound Description Classification: Full Thickness With Exposed Supp Exudate Amount: Medium Exudate Type: Serosanguineous Exudate Color: red, brown ort Structures Wound Bed Granulation Amount: Large (67-100%) Exposed Structure Granulation Quality: Red Fat Layer (Subcutaneous Tissue) Exposed: Yes Necrotic Amount: Small (1-33%) Necrotic Quality: Adherent Slough Periwound Skin Texture Texture Color No Abnormalities Noted: No No Abnormalities Noted: No Callus: No Atrophie Blanche: No Crepitus: No Cyanosis: No Excoriation: No Ecchymosis: No Induration: No Erythema: No Rash: No Hemosiderin Staining: No Scarring: No Mottled: No Pallor: No Moisture  Rubor: No No Abnormalities Noted: No Dry / Scaly: No Maceration: No Treatment Notes Wound #7 (Malleolus) Wound Laterality: Right, Medial Cleanser Soap and Water Discharge Instruction: May shower and wash  wound with dial antibacterial soap and water prior to dressing change. Vashe 5.8 (oz) Discharge Instruction: Cleanse the wound with Vashe prior to applying a clean dressing using gauze sponges, not tissue or cotton balls. Peri-Wound Care [address] (Moisturizing lotion) [name], [name] ([id number]) [id number] Page 6 of 6 Discharge Instruction: Apply moisturizing lotion as directed Topical Gentamicin Discharge Instruction: As directed by physician Primary Dressing Hydrofera Blue Ready Transfer Foam, 2.5x2.5 (in/in) Discharge Instruction: Apply directly to wound bed as directed Secondary Dressing Zetuvit Plus 4x8 in Discharge Instruction: Apply over primary dressing as directed. Secured With Compression Wrap Urgo K2, (equivalent to a 4 layer) two layer compression system, regular Discharge Instruction: Apply Urgo K2 as directed (alternative to 4 layer compression). Compression Stockings Add-Ons Electronic Signature(s) Signed: 08/26/2022 12:35:38 PM By: Thayer Dallas Entered By: Thayer Dallas on 08/24/2022 15:08:36 -------------------------------------------------------------------------------- Vitals Details Patient Name: Date of Service: [name], [name] 08/24/2022 3:00 PM Medical Record Number: [id number] Patient Account Number: [id number] Date of Birth/Sex: Treating RN: 02-24-1955 (67 y.o. M) Primary Care Dontea Corlew: [name] Other Clinician: Referring Felicidad Sugarman: Treating Pablo Mathurin/Extender: Alver Fisher in Treatment: 55 Vital Signs Time Taken: 15:06 Temperature (F): 98.2 Height (in): 71 Pulse (bpm): 73 Weight (lbs): 350 Respiratory Rate (breaths/min): 18 Body Mass Index (BMI): 48.8 Blood Pressure (mmHg): 122/79 Reference Range: 80 - 120 mg / dl Electronic Signature(s) Signed: 08/24/2022 6:05:41 PM By: Shawn Stall RN, BSN Entered By: Shawn Stall on 08/24/2022 15:40:42

## 2022-08-29 ENCOUNTER — Other Ambulatory Visit (HOSPITAL_COMMUNITY): Payer: Self-pay | Admitting: Internal Medicine

## 2022-08-29 DIAGNOSIS — H524 Presbyopia: Secondary | ICD-10-CM | POA: Diagnosis not present

## 2022-08-31 ENCOUNTER — Encounter (HOSPITAL_BASED_OUTPATIENT_CLINIC_OR_DEPARTMENT_OTHER): Payer: Medicare Other | Admitting: Physician Assistant

## 2022-08-31 DIAGNOSIS — L97828 Non-pressure chronic ulcer of other part of left lower leg with other specified severity: Secondary | ICD-10-CM | POA: Diagnosis not present

## 2022-08-31 DIAGNOSIS — E1151 Type 2 diabetes mellitus with diabetic peripheral angiopathy without gangrene: Secondary | ICD-10-CM | POA: Diagnosis not present

## 2022-08-31 DIAGNOSIS — I87333 Chronic venous hypertension (idiopathic) with ulcer and inflammation of bilateral lower extremity: Secondary | ICD-10-CM | POA: Diagnosis not present

## 2022-08-31 DIAGNOSIS — I872 Venous insufficiency (chronic) (peripheral): Secondary | ICD-10-CM | POA: Diagnosis not present

## 2022-08-31 DIAGNOSIS — E11621 Type 2 diabetes mellitus with foot ulcer: Secondary | ICD-10-CM | POA: Diagnosis not present

## 2022-08-31 DIAGNOSIS — M109 Gout, unspecified: Secondary | ICD-10-CM | POA: Diagnosis not present

## 2022-08-31 DIAGNOSIS — L97818 Non-pressure chronic ulcer of other part of right lower leg with other specified severity: Secondary | ICD-10-CM | POA: Diagnosis not present

## 2022-08-31 DIAGNOSIS — I509 Heart failure, unspecified: Secondary | ICD-10-CM | POA: Diagnosis not present

## 2022-08-31 DIAGNOSIS — M1990 Unspecified osteoarthritis, unspecified site: Secondary | ICD-10-CM | POA: Diagnosis not present

## 2022-08-31 DIAGNOSIS — Z7901 Long term (current) use of anticoagulants: Secondary | ICD-10-CM | POA: Diagnosis not present

## 2022-08-31 DIAGNOSIS — L97518 Non-pressure chronic ulcer of other part of right foot with other specified severity: Secondary | ICD-10-CM | POA: Diagnosis not present

## 2022-08-31 DIAGNOSIS — I2510 Atherosclerotic heart disease of native coronary artery without angina pectoris: Secondary | ICD-10-CM | POA: Diagnosis not present

## 2022-08-31 DIAGNOSIS — L97312 Non-pressure chronic ulcer of right ankle with fat layer exposed: Secondary | ICD-10-CM | POA: Diagnosis not present

## 2022-08-31 DIAGNOSIS — I110 Hypertensive heart disease with heart failure: Secondary | ICD-10-CM | POA: Diagnosis not present

## 2022-08-31 DIAGNOSIS — I890 Lymphedema, not elsewhere classified: Secondary | ICD-10-CM | POA: Diagnosis not present

## 2022-08-31 NOTE — Progress Notes (Addendum)
LIAHM, TREMONTI ([id number]) [id number] Page 1 of 12 Visit Report for 08/31/2022 Chief Complaint Document Details Patient Name: Date of Service: [name], [name] 08/31/2022 3:00 PM Medical Record Number: [id number] Patient Account Number: [id number] Date of Birth/Sex: Treating RN: 1955-04-19 (67 y.o. M) Primary Care Provider: [name] Other Clinician: Referring Provider: Treating Provider/Extender: Alver Fisher in Treatment: 25 Information Obtained from: Patient Chief Complaint 08/04/2021; patient returns to clinic with bilateral leg wounds as well as areas on the right foot Electronic Signature(s) Signed: 08/31/2022 3:40:31 PM By: Allen Derry PA-C Entered By: Allen Derry on 08/31/2022 12:40:31 -------------------------------------------------------------------------------- Debridement Details Patient Name: Date of Service: [name], [name] 08/31/2022 3:00 PM Medical Record Number: [id number] Patient Account Number: [id number] Date of Birth/Sex: Treating RN: 08/25/1955 (67 y.o. M) Primary Care Provider: [name] Other Clinician: Referring Provider: Treating Provider/Extender: Alver Fisher in Treatment: 56 Debridement Performed for Assessment: Wound #7 Right,Medial Malleolus Performed By: Physician Lenda Kelp, PA Debridement Type: Debridement Severity of Tissue Pre Debridement: Fat layer exposed Level of Consciousness (Pre-procedure): Awake and Alert Pre-procedure Verification/Time Out Yes - 15:55 Taken: Start Time: 15:55 Pain Control: Lidocaine 4% T opical Solution Percent of Wound Bed Debrided: 100% T Area Debrided (cm): otal 5.02 Tissue and other material debrided: Viable, Non-Viable, Callus, Slough, Subcutaneous, Slough Level: Skin/Subcutaneous Tissue Debridement Description: Excisional Instrument: Curette Bleeding: Minimum Hemostasis Achieved: Pressure End Time: 16:00 Procedural Pain:  0 Post Procedural Pain: 0 Response to Treatment: Procedure was tolerated well Level of Consciousness (Post- Awake and Alert procedure): Post Debridement Measurements of Total Wound Length: (cm) 4 Width: (cm) 1.6 Depth: (cm) 0.3 Volume: (cm) 1.508 Character of Wound/Ulcer Post Debridement: Improved [name], [name] ([id number]) [id number].pdf Page 2 of 12 Severity of Tissue Post Debridement: Fat layer exposed Post Procedure Diagnosis Same as Pre-procedure Electronic Signature(s) Signed: 08/31/2022 5:16:52 PM By: Allen Derry PA-C Entered By: Allen Derry on 08/31/2022 13:04:44 -------------------------------------------------------------------------------- HPI Details Patient Name: Date of Service: [name], [name] 08/31/2022 3:00 PM Medical Record Number: [id number] Patient Account Number: [id number] Date of Birth/Sex: Treating RN: August 20, 1955 (67 y.o. M) Primary Care Provider: [name] Other Clinician: Referring Provider: Treating Provider/Extender: Alver Fisher in Treatment: 53 History of Present Illness HPI Description: ADMISSION 03/22/2021 This is a 67 year old man with a past medical history significant for diabetes type 2, congestive heart failure, peripheral arterial disease, morbid obesity, venous insufficiency, and coronary artery disease. He has been followed by Dr. Loreta Ave in podiatry, who performed a transmetatarsal amputation on the left foot in August 2022. He had issues healing that wound, but based upon Dr. Kenna Gilbert notes, ultimately the TMA wound healed. During his recovery from that surgery, however, ulcers opened up over the DIP joint of the right second and third toe. These have apparently closed and reopened multiple times. It sounds like one of the issues has been moisture accumulation and maceration of the tissues causing them to reopen. At his last visit with Dr. Loreta Ave, on March 01, 2021, there continues to be  problems with moisture and he was referred to wound care for further evaluation and management. He had a formal aortogram with runoff performed prior to his TMA. The findings are copied here: Patient has inline flow to both feet with no significant flow-limiting lesion that would be amenable to percutaneous or open revascularization. He does have an element of small vessel disease and has a short segment occlusion of the distal anterior tibial/dorsalis pedis  artery on the left foot but does have posterior tibial artery flow. Would recommend management of wounds with amputation of toes 2 and 3 on the right foot if the wounds do not heal and deteriorate. Transmetatarsal amputation on the left side has as good a blood supply as it is going to get and hopefully this will heal in the future. Formal ABIs were done in January 2023. They are normal bilaterally. ABI Findings: +---------+------------------+-----+----------+--------+ Right Rt Pressure (mmHg)IndexWaveform Comment  +---------+------------------+-----+----------+--------+ Brachial 160     +---------+------------------+-----+----------+--------+ PTA 192 1.06 monophasic  +---------+------------------+-----+----------+--------+ DP 159 0.88 monophasic  +---------+------------------+-----+----------+--------+ Great T oe145 0.80    +---------+------------------+-----+----------+--------+ +--------+------------------+-----+---------+-------+ Left Lt Pressure (mmHg)IndexWaveform Comment +--------+------------------+-----+---------+-------+ [id number]     +--------+------------------+-----+---------+-------+ PTA 204 1.13 triphasic  +--------+------------------+-----+---------+-------+ DP 194 1.07 biphasic   +--------+------------------+-----+---------+-------+ +-------+-----------+-----------+------------+------------+ ABI/TBIT oday's ABIT oday's TBIPrevious ABIPrevious  TBI +-------+-----------+-----------+------------+------------+ Right 1.06 0.8 1.26 0.65  +-------+-----------+-----------+------------+------------+ Left 1.13 amputation 1.15 amputation  +-------+-----------+-----------+------------+------------+ Previous ABI on 08/06/20 at [facility] Pedal pressures falsely elevated due to medial calcification. [name], [name] ([id number]) [id number] Page 3 of 12 Summary: Right: Resting right ankle-brachial index is within normal range. The right toe-brachial index is normal. Left: Resting left ankle-brachial index is within normal range. READMISSION 08/04/2021 Mr. Cuffe is now a 67 year old man who I remember from this clinic many years ago I think he had a right lower extremity predominantly venous wound at the time. He was here for 1 visit in March of this year had wounds on his right second and third toes we apparently dressed them many and they healed so he did not come back. He is listed in [place] is being a diabetic although the patient denies this says he is verified it with his primary doctor. In any case over the last several weeks or so according the patient although these wounds look somewhat more chronic than that he has developed predominantly large wounds on the right medial and right lateral ankle smaller areas on the left leg and areas on the dorsal aspect of his right second and third toes. Its not clear how he has been dressing these. 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 The patient has known PAD. He had an angiogram in August 2022 at which time he had nonhealing wounds in both feet. On the left his major vessels in the thigh were all patent. He had three-vessel patent to the level of the ankle. He had a very short occlusion in the left anterior tibial. On the right lower extremity the major vessels in his thigh were  all patent. He had three-vessel runoff to the foot sluggish filling of the anterior tibial artery. He was felt to have some component of small vessel disease but nothing that was amenable or needed revascularization. It was recommended that he have amputation of the second and third toes on the right foot if they did not heal He has been following with Dr. Loreta Ave of podiatry. Dr. Loreta Ave got him juxta lite stockings although I do not think he had them on properly he has uncontrolled edema in both legs Past medical history includes type 2 diabetes [although the patient really denies this], left TMA in 2022,lower extremity wounds in fact attendance at this clinic in 2009-2010, A-fib on Eliquis, chronic venous insufficiency with secondary lymphedema history of non-Hodgkin's lymphoma. 08-11-2021 upon evaluation today patient presents for follow-up evaluation he was seen last Wednesday initially for inspection here  in our clinic. With that being said he tells me that he unfortunately has been having a lot of drainage and is actually coming through his wrap. Fortunately I do not see any evidence of active infection locally or systemically at this time which is great news. No fevers, chills, nausea, vomiting, or diarrhea. With that being said there does appear to be some evidence of local infection based on what I am seeing today. 08-18-2021 upon evaluation today patient appears to be doing okay currently in regard to his wounds with that being said that he is doing much better but still has a long ways to go to get to where he wanted to be. I think the infection is significantly improved. He has another week of the antibiotic at this point. 08-25-2021 upon evaluation today patient's wounds are actually doing decently well he has erythema has significantly improved. I think the cellulitis is under controlled I am going to continue him on 2 more weeks of the Levaquin at 500 mg this is a lower dose but I am hoping it  will be better for him. 09-08-2021 upon evaluation today patient appears to be doing excellent in regard to his wounds. Since I last saw him he was actually in the hospital where he ended up having a pacemaker put in. Subsequently he tells me that he is actually doing quite well although they were unsure whether they were going to do it due to the fact that he had the wounds on his legs. And then I am glad they did anything seem to be doing well. 09-15-2021 upon evaluation patient's wounds are actually showing signs of improvement. The right side wounds do appear to have some need for sharp debridement today and I Ernie Hew go ahead and proceed with that. I think that if we get the wounds cleaned up he will actually show signs of continued improvement. I am also leaning towards switching to [facility] which I think will be a much better option for him. 09-22-2021 patient's wounds are showing signs of excellent improvement. I am actually extremely pleased with where we stand and I think that the patient is making great progress. There does not appear to be any signs of active infection. 09-29-2021 upon evaluation today patient appears to be doing excellent in regard to his wounds. He is actually tolerating the dressing changes without complication. Fortunately I see no evidence of active infection locally or systemically at this time which is great news and overall I am extremely pleased with where we stand currently. [id number] upon evaluation today patient actually appears to be doing excellent in regard to his wounds. The left leg is almost completely healed the right ankle is significantly smaller. Overall I am extremely pleased with where we stand at this point. I do not see any evidence of active infection at this time. 10-13-2021 upon evaluation today patient appears to be doing excellent in regard to his wounds. I really feel like he is making good progress here and I am very pleased in that regard.  Fortunately I do not see any signs of active infection at this time. We are using the [facility] topical antibiotic therapy. 10-20-2021 upon evaluation today patient appears to be doing well with regard to his wound on the right medial ankle region the left leg is almost completely healed. I am actually very pleased with where we stand today. 11-03-2021 upon evaluation today patient's wound actually is going require some sharp debridement but appears to be doing  much better which is great news. Fortunately I do not see any signs of active infection at this time. 11-10-2021 upon evaluation today patient's wound is actually showing signs of excellent improvement. Fortunately I do not see any evidence of infection locally or systemically which is great news and overall I am extremely pleased with where we stand today. I do believe he is making good progress he does have his [facility] topical antibiotics with him here today. 11-24-2021 upon evaluation today patient actually showing signs of excellent improvement this appears to be doing much better. Fortunately I do not see any evidence of infection locally or systemically at this time. 12-01-2021 upon evaluation today patient appears to be doing well currently in regard to his wound. He has been tolerating the dressing changes without complication. Fortunately I do not see any evidence of active infection at this time which is great news and overall I am extremely pleased with where we stand today. 12-15-2021 upon evaluation today patient appears to be doing well with regard to his wound. He is showing signs of improvement is slow but nonetheless we are making improvements here. Fortunately I do not see any evidence of active infection locally nor systemically at this time. We are still using the [facility] topical antibiotic over the open area only. 12-29-2021 upon evaluation today patient's wound actually is showing signs of excellent improvement. It has  been 2 weeks since I perform any debridement and it definitely shows he has some tissue that needs to be cleaned away but I think we can do so quite easily and readily today. The good news is I do not see any signs of infection I think he is doing much better in that regard. Overall I am extremely pleased with where we stand. 01-12-2022 upon evaluation today patient appears to be doing well currently in regard to his wound although it is not getting significantly smaller it is also not getting any larger. Fortunately I do not see any evidence of infection locally nor systemically which is great news and overall I am extremely pleased with where things stand currently. 01-19-2022 upon evaluation today patient appears to be doing well currently in regard to his wound. The PolyMem actually seems to have done extremely well for him. Fortunately I do not see any signs of infection locally nor systemically at this time. [name], [name] ([id number]) [id number] Page 4 of 12 01-26-2022 upon evaluation today patient appears to be making progress. Fortunately there does not appear to be any signs of infection which is great news. No fevers, chills, nausea, vomiting, or diarrhea. With that being said this is very slow to heal and although it is smaller I still feel like we may want to try to do something to speed this up I think that a skin substitute could be beneficial, look into Kerecis. 02-02-2022 upon evaluation today patient's wound is actually showing signs of excellent improvement. I do not see any evidence of infection and overall I think that we are headed in the right direction. Fortunately I think that he is tolerating the dressing changes without complication. 02-09-2022 upon evaluation today patient appears to be doing well currently in regard to his wound. It does look a little bit macerated but fortunately does not appear to be showing any signs of significant skin breakdown  over the macerated area which is good news. Fortunately I do not see any evidence of active infection locally nor systemically at this point which is great news. 02-23-2022 upon  evaluation today patient appears to be doing well currently in regard to his wounds. In fact his area on the ankle is actually showing signs of healing quite nicely. We do have the Apligraf ready today I am hopeful this is going to speed things up and get this closed much more effectively and quickly. Fortunately I do not see any signs of active infection locally nor systemically at this time which is great news. 03-02-2022 upon evaluation today patient appears to be doing excellent in regard to his wound. He is actually been tolerating the dressing changes without complication the wound on the left medial lower extremity is doing quite well and Apligraf seems to have been extremely beneficial for him. I am extremely pleased with where we stand today. 03/16/2022: The wound measurements are smaller today. He has accumulation of a yellow crust around the edges and slough on the surface. There is a musty odor coming from the wound. He has been getting Apligraf. 03-30-2022 upon evaluation today patient appears to be making progress. In regard to his leg ulcer. This is slow but nonetheless the Apligraf has fed things up. Fortunately I do not see any evidence of infection locally nor systemically at this time. No fevers, chills, nausea, vomiting, or diarrhea. Patient is here for Apligraf #4 today. 04-13-2022 upon evaluation today patient appears to be doing a little bit more poorly in regard to his wound. He actually feels like his wrap may have been a little bit tight. Fortunately there does not appear to be any signs of active infection locally nor systemically which is great news and I am pleased in that regard. 04-20-2022 upon evaluation today patient appears to be doing better in regard to his wound from the standpoint of swelling he  is actually lost 33 pounds on torsemide in the past week his leg is significantly smaller compared to what it has been. Fortunately I do not see any signs of active infection locally nor systemically at this time. 04-27-2022 upon evaluation today patient's wound actually is showing signs of erythema and warmth around the edges of the wound I am actually concerned about the possibility of infection. I think we probably need to obtain a wound culture and also can recommend based on what we are seeing that we go ahead and have the patient continue to utilize the compression wrapping which I think has been of benefit. 05-04-2022 upon evaluation today patient appears to be doing well currently in regard to his wound. He has been tolerating the dressing changes without complication. Fortunately there does not appear to be any signs of active infection locally or systemically which is great news. 05-11-2022 upon evaluation today patient actually appears to be doing excellent. He has been tolerating the dressing changes we actually were trialing a new medication on him last week and this was the ergo clean Ag dressing. With that being said this actually seems to have done extremely well at this point. Fortunately I do not see any signs of active infection locally or systemically at this time. 05-18-2022 upon evaluation today patient appears to be doing okay in regard to his wound I do not see any signs of worsening also notes any signs of significant improvement. Fortunately I do believe that removing in the right direction in general. 05-25-22 upon evaluation today patient appears to be doing well currently in regard to his wound. He has been tolerating the dressing changes without complication. Fortunately I do not see any signs of active infection  locally nor systemically which is great news and overall I am extremely pleased with where we stand currently. There does not appear to be any signs of active infection  locally nor systemically which is great news. No fevers, chills, nausea, vomiting, or diarrhea. 06-01-2022 upon evaluation today patient actually appears to be making excellent progress in regard to his ankle region. I am very pleased with where we stand I think that he is doing extremely well and very pleased with endoform and drawtex combination. 06-08-2022 upon evaluation today patient appears to be doing well currently in regard to his wound. He has been making good progress and I feel like practicing improvement in the overall size of the wound. I am very pleased in that regard. Fortunately I do not see any signs of active infection locally nor systemically which is great news. No fevers, chills, nausea, vomiting, or diarrhea. 06-15-2022 upon evaluation today patient appears to be doing well currently in regard to his wound. He has been tolerating the dressing changes without complication. I do believe the endoform has been extremely helpful for him up to this point. I do not see any signs of active infection locally nor systemically which is great news and in general I do believe that we are making pretty good progress here which is excellent news. 06-22-2022 upon evaluation today patient appears to be doing well currently in regard to his wound. He has been tolerating the dressing changes without complication. Fortunately there does not appear to be any signs of active infection at this time which is great news. I think that he is actually taking good progress with the endoform which is excellent as well. 06-29-2022 upon evaluation patient appears to be doing well currently in regard to his wound. He is actually making good progress towards closure and I am very pleased in that regard. I do not see any evidence of active infection locally nor systemically which is great news and in general I do believe that we are making excellent progress here overall. 07-06-2022 upon evaluation today patient  appears to be doing well currently in regard to his wound. He has been tolerating the dressing changes without complication. The good news is he is slowly getting smaller each time I see him. 07-13-2022 upon evaluation today patient appears to be doing well currently in regard to his wound is slowly showing signs of improvement. Fortunately I do not see any signs of infection at this time. No fevers, chills, nausea, vomiting, or diarrhea. 07-20-2022 upon evaluation today patient appears to be doing well currently in regard to his wound. He has been tolerating the dressing changes without complication. Fortunately I do not see any evidence of active infection locally or systemically which is great news and in general I do believe that we are making headway towards complete closure. This is excellent news. With that being said we are preparing for him to be leaving to go out of town in about a week and a half. For that reason we will going to make a few changes to his dressing today in order to give him something that I think will be easier for him to do when he is out of town. 07-27-2022 upon evaluation today patient appears to be doing well currently in regard to his wound. He has been tolerating the dressing changes without complication. Fortunately there does not appear to be any signs of infection. Fortunately there does not appear to be any signs of infection 08-10-2022 upon evaluation  today patient appears to be doing okay in regard to his wound. He actually has been gone for 2 weeks and in the interim unfortunately he did not have result out of the Tegaderm as well as hoping for but nonetheless took good care of his wound. Does not appear to be infected I do think we need to go back to the compression wrap however which I think would definitely benefit him. Fortunately I do not see any signs of active infection at this time which is great news. ORON, POPPEN ([id number])  [id number] Page 5 of 12 08-17-2022 upon evaluation today patient unfortunately appears to be showing signs of infection based on what I see at this time. Fortunately I do not see any evidence of systemic infection though locally I do definitely see some changes that are concerning. I do not think that this is systemic at all. 08-24-2022 upon evaluation today patient appears to be doing well currently in regard to his wound. He has been tolerating the dressing changes this looks significantly improved compared to last week and very pleased. I do not see any signs of active infection locally or systemically at this time. 08-31-2022 upon evaluation today patient appears to be doing well currently in regard to his wound this is actually showing signs of improvement and actually very pleased with where we stand today. Fortunately I do not see any signs of active infection which is great news. Electronic Signature(s) Signed: 08/31/2022 4:12:07 PM By: Allen Derry PA-C Entered By: Allen Derry on 08/31/2022 13:12:07 -------------------------------------------------------------------------------- Physical Exam Details Patient Name: Date of Service: [name], [name] 08/31/2022 3:00 PM Medical Record Number: [id number] Patient Account Number: [id number] Date of Birth/Sex: Treating RN: 18-Feb-1955 (67 y.o. M) Primary Care Provider: [name] Other Clinician: Referring Provider: Treating Provider/Extender: Alver Fisher in Treatment: 47 Constitutional Well-nourished and well-hydrated in no acute distress. Respiratory normal breathing without difficulty. Psychiatric this patient is able to make decisions and demonstrates good insight into disease process. Alert and Oriented x 3. pleasant and cooperative. Notes Patient's wound bed actually did require sharp debridement clearway necrotic debris I did perform this debridement today he tolerated that without  complication debridement the wound bed appears to be doing much better which is great news. I am extremely happy with where we stand currently. and post I do think it is probably time for [place] to switch back to the endoform which has done much better for him in the past I think the [facility] served its purpose but at this time now that the drainage is a lot less I think endoform will be better. Electronic Signature(s) Signed: 08/31/2022 4:12:24 PM By: Allen Derry PA-C Entered By: Allen Derry on 08/31/2022 13:12:23 -------------------------------------------------------------------------------- Physician Orders Details Patient Name: Date of Service: [name], [name] 08/31/2022 3:00 PM Medical Record Number: [id number] Patient Account Number: [id number] Date of Birth/Sex: Treating RN: 1955-07-20 (67 y.o. M) Primary Care Provider: [name] Other Clinician: Referring Provider: Treating Provider/Extender: Alver Fisher in Treatment: 02 Verbal / Phone Orders: No Diagnosis Coding ICD-10 Coding Code Description I89.0 Lymphedema, not elsewhere classified I87.333 Chronic venous hypertension (idiopathic) with ulcer and inflammation of bilateral lower extremity L97.828 Non-pressure chronic ulcer of other part of left lower leg with other specified severity PROMISE, KERIN ([id number]) [id number] Page 6 of 12 L97.818 Non-pressure chronic ulcer of other part of right lower leg with other specified severity E11.621 Type 2 diabetes mellitus with foot ulcer L97.518  Non-pressure chronic ulcer of other part of right foot with other specified severity Follow-up Appointments ppointment in 1 week. - Nurse visit 09/07/22 Return A ppointment in 2 weeks. Leonard Schwartz Wednesday 09/14/22 Return A Return appointment in 3 weeks. Leonard Schwartz Wednesday 09/21/2022 Nurse Visit: - ****Wednesday 09/07/2022***** Anesthetic (In clinic) Topical Lidocaine 5% applied to wound bed -  Used in Clinic Prior to debridement Cellular or Tissue Based Products Cellular or Tissue Based Product Type: - Run IVR for Kerrecis- denied #1 Apligraf applied 02/23/2022 #2 Apligraf applied 03/02/2022 #3 Apligraf applied 03/16/2022 #4 Apligraf applied 03/30/2022 HOLD APLIGRAF this week. Cellular or Tissue Based Product applied to wound bed, secured with steri-strips, cover with Adaptic or Mepitel. (DO NOT REMOVE). Bathing/ Shower/ Hygiene May shower with protection but do not get wound dressing(s) wet. Protect dressing(s) with water repellant cover (for example, large plastic bag) or a cast cover and may then take shower. Edema Control - Lymphedema / SCD / Other Elevate legs to the level of the heart or above for 30 minutes daily and/or when sitting for 3-4 times a day throughout the day. Avoid standing for long periods of time. Patient to wear own compression stockings every day. - apply in the morning and remove at night left leg. Exercise regularly Moisturize legs daily. - apply every night before bed. Compression stocking or Garment 30-40 mm/Hg pressure to: - wear your VIVE compression stocking to left leg. apply in the morning and remove at night. Wound Treatment Wound #7 - Malleolus Wound Laterality: Right, Medial Cleanser: Soap and Water 1 x Per Week/30 Days Discharge Instructions: May shower and wash wound with dial antibacterial soap and water prior to dressing change. Cleanser: Vashe 5.8 (oz) 1 x Per Week/30 Days Discharge Instructions: Cleanse the wound with Vashe prior to applying a clean dressing using gauze sponges, not tissue or cotton balls. Peri-Wound Care: Sween Lotion (Moisturizing lotion) 1 x Per Week/30 Days Discharge Instructions: Apply moisturizing lotion as directed Topical: Gentamicin 1 x Per Week/30 Days Discharge Instructions: As directed by physician Prim Dressing: Endoform 2x2 in (Dispense As Written) 1 x Per Week/30 Days ary Discharge Instructions: Moisten  with saline Secondary Dressing: Zetuvit Plus 4x8 in 1 x Per Week/30 Days Discharge Instructions: Apply over primary dressing as directed. Compression Wrap: Urgo K2, (equivalent to a 4 layer) two layer compression system, regular 1 x Per Week/30 Days Discharge Instructions: Apply Urgo K2 as directed (alternative to 4 layer compression). Electronic Signature(s) Signed: 08/31/2022 5:16:52 PM By: Allen Derry PA-C Signed: 08/31/2022 5:26:36 PM By: Karie Schwalbe RN Entered By: Karie Schwalbe on 08/31/2022 13:47:23 -------------------------------------------------------------------------------- Problem List Details Patient Name: Date of Service: [name], [name] 08/31/2022 3:00 PM Medical Record Number: [id number] Patient Account Number: [id number] Date of Birth/Sex: Treating RN: 1955/04/07 (67 y.o. M) Primary Care Provider: [name] Other Clinician: ABDULKARIM, BOBBIT [name] ([id number]) [id number] Page 7 of 12 Referring Provider: Treating Provider/Extender: Alver Fisher in Treatment: 56 Active Problems ICD-10 Encounter Code Description Active Date MDM Diagnosis I89.0 Lymphedema, not elsewhere classified 08/04/2021 No Yes I87.333 Chronic venous hypertension (idiopathic) with ulcer and inflammation of 08/04/2021 No Yes bilateral lower extremity L97.828 Non-pressure chronic ulcer of other part of left lower leg with other specified 08/04/2021 No Yes severity L97.818 Non-pressure chronic ulcer of other part of right lower leg with other specified 08/04/2021 No Yes severity E11.621 Type 2 diabetes mellitus with foot ulcer 08/04/2021 No Yes L97.518 Non-pressure chronic ulcer of other part of right foot with other  specified 08/04/2021 No Yes severity Inactive Problems Resolved Problems Electronic Signature(s) Signed: 08/31/2022 3:40:24 PM By: Allen Derry PA-C Entered By: Allen Derry on 08/31/2022  12:40:24 -------------------------------------------------------------------------------- Progress Note Details Patient Name: Date of Service: [name], [name] 08/31/2022 3:00 PM Medical Record Number: [id number] Patient Account Number: [id number] Date of Birth/Sex: Treating RN: 05-Jan-1956 (67 y.o. M) Primary Care Provider: [name] Other Clinician: Referring Provider: Treating Provider/Extender: Alver Fisher in Treatment: 56 Subjective Chief Complaint Information obtained from Patient 08/04/2021; patient returns to clinic with bilateral leg wounds as well as areas on the right foot History of Present Illness (HPI) ADMISSION 03/22/2021 This is a 67 year old man with a past medical history significant for diabetes type 2, congestive heart failure, peripheral arterial disease, morbid obesity, venous insufficiency, and coronary artery disease. He has been followed by Dr. Loreta Ave in podiatry, who performed a transmetatarsal amputation on the left foot in August 2022. He had issues healing that wound, but based upon Dr. Kenna Gilbert notes, ultimately the TMA wound healed. During his recovery from that surgery, however, ulcers opened up over the DIP joint of the right second and third toe. These have apparently closed and reopened multiple times. It sounds [name], [name] ([id number]) [id number] Page 8 of 12 like one of the issues has been moisture accumulation and maceration of the tissues causing them to reopen. At his last visit with Dr. Loreta Ave, on March 01, 2021, there continues to be problems with moisture and he was referred to wound care for further evaluation and management. He had a formal aortogram with runoff performed prior to his TMA. The findings are copied here: Patient has inline flow to both feet with no significant flow-limiting lesion that would be amenable to percutaneous or open revascularization. He does have an element of  small vessel disease and has a short segment occlusion of the distal anterior tibial/dorsalis pedis artery on the left foot but does have posterior tibial artery flow. Would recommend management of wounds with amputation of toes 2 and 3 on the right foot if the wounds do not heal and deteriorate. Transmetatarsal amputation on the left side has as good a blood supply as it is going to get and hopefully this will heal in the future. Formal ABIs were done in January 2023. They are normal bilaterally. ABI Findings: +---------+------------------+-----+----------+--------+ Right Rt Pressure (mmHg)IndexWaveform Comment  +---------+------------------+-----+----------+--------+ Brachial 160    +---------+------------------+-----+----------+--------+ PTA 192 1.06 monophasic  +---------+------------------+-----+----------+--------+ DP 159 0.88 monophasic  +---------+------------------+-----+----------+--------+ Great T oe145 0.80    +---------+------------------+-----+----------+--------+ +--------+------------------+-----+---------+-------+ Left Lt Pressure (mmHg)IndexWaveform Comment +--------+------------------+-----+---------+-------+ [id number]    +--------+------------------+-----+---------+-------+ PTA 204 1.13 triphasic  +--------+------------------+-----+---------+-------+ DP 194 1.07 biphasic   +--------+------------------+-----+---------+-------+ +-------+-----------+-----------+------------+------------+ ABI/TBIT oday's ABIT oday's TBIPrevious ABIPrevious TBI +-------+-----------+-----------+------------+------------+ Right 1.06 0.8 1.26 0.65  +-------+-----------+-----------+------------+------------+ Left 1.13 amputation 1.15 amputation  +-------+-----------+-----------+------------+------------+ Previous ABI on 08/06/20 at [facility] Pedal pressures falsely elevated due to medial  calcification. Summary: Right: Resting right ankle-brachial index is within normal range. The right toe-brachial index is normal. Left: Resting left ankle-brachial index is within normal range. READMISSION 08/04/2021 Mr. Kalafut is now a 67 year old man who I remember from this clinic many years ago I think he had a right lower extremity predominantly venous wound at the time. He was here for 1 visit in March of this year had wounds on his right second and third toes we apparently dressed them many and they healed so he did not come back. He is listed in [place] is being a diabetic  although the patient denies this says he is verified it with his primary doctor. In any case over the last several weeks or so according the patient although these wounds look somewhat more chronic than that he has developed predominantly large wounds on the right medial and right lateral ankle smaller areas on the left leg and areas on the dorsal aspect of his right second and third toes. Its not clear how he has been dressing these. 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 The patient has known PAD. He had an angiogram in August 2022 at which time he had nonhealing wounds in both feet. On the left his major vessels in the thigh were all patent. He had three-vessel patent to the level of the ankle. He had a very short occlusion in the left anterior tibial. On the right lower extremity the major vessels in his thigh were all patent. He had three-vessel runoff to the foot sluggish filling of the anterior tibial artery. He was felt to have some component of small vessel disease but nothing that was amenable or needed revascularization. It was recommended that he have amputation of the second and third toes on the right foot if they did not heal He has been following with Dr. Loreta Ave of podiatry. Dr. Loreta Ave got him juxta lite stockings although I  do not think he had them on properly he has uncontrolled edema in both legs Past medical history includes type 2 diabetes [although the patient really denies this], left TMA in 2022,lower extremity wounds in fact attendance at this clinic in 2009-2010, A-fib on Eliquis, chronic venous insufficiency with secondary lymphedema history of non-Hodgkin's lymphoma. 08-11-2021 upon evaluation today patient presents for follow-up evaluation he was seen last Wednesday initially for inspection here in our clinic. With that being said he tells me that he unfortunately has been having a lot of drainage and is actually coming through his wrap. Fortunately I do not see any evidence of active infection locally or systemically at this time which is great news. No fevers, chills, nausea, vomiting, or diarrhea. With that being said there does appear to be some evidence of local infection based on what I am seeing today. 08-18-2021 upon evaluation today patient appears to be doing okay currently in regard to his wounds with that being said that he is doing much better but still has a long ways to go to get to where he wanted to be. I think the infection is significantly improved. He has another week of the antibiotic at this point. 08-25-2021 upon evaluation today patient's wounds are actually doing decently well he has erythema has significantly improved. I think the cellulitis is under controlled I am going to continue him on 2 more weeks of the Levaquin at 500 mg this is a lower dose but I am hoping it will be better for him. 09-08-2021 upon evaluation today patient appears to be doing excellent in regard to his wounds. Since I last saw him he was actually in the hospital where he ended up having a pacemaker put in. Subsequently he tells me that he is actually doing quite well although they were unsure whether they were going to do it due to the fact that he had the wounds on his legs. And then I am glad they did anything  seem to be doing well. [name], [name] ([id number]) [id number] Page 9 of 12 09-15-2021 upon evaluation patient's  wounds are actually showing signs of improvement. The right side wounds do appear to have some need for sharp debridement today and I Ernie Hew go ahead and proceed with that. I think that if we get the wounds cleaned up he will actually show signs of continued improvement. I am also leaning towards switching to [facility] which I think will be a much better option for him. 09-22-2021 patient's wounds are showing signs of excellent improvement. I am actually extremely pleased with where we stand and I think that the patient is making great progress. There does not appear to be any signs of active infection. 09-29-2021 upon evaluation today patient appears to be doing excellent in regard to his wounds. He is actually tolerating the dressing changes without complication. Fortunately I see no evidence of active infection locally or systemically at this time which is great news and overall I am extremely pleased with where we stand currently. [id number] upon evaluation today patient actually appears to be doing excellent in regard to his wounds. The left leg is almost completely healed the right ankle is significantly smaller. Overall I am extremely pleased with where we stand at this point. I do not see any evidence of active infection at this time. 10-13-2021 upon evaluation today patient appears to be doing excellent in regard to his wounds. I really feel like he is making good progress here and I am very pleased in that regard. Fortunately I do not see any signs of active infection at this time. We are using the [facility] topical antibiotic therapy. 10-20-2021 upon evaluation today patient appears to be doing well with regard to his wound on the right medial ankle region the left leg is almost completely healed. I am actually very pleased with where we stand  today. 11-03-2021 upon evaluation today patient's wound actually is going require some sharp debridement but appears to be doing much better which is great news. Fortunately I do not see any signs of active infection at this time. 11-10-2021 upon evaluation today patient's wound is actually showing signs of excellent improvement. Fortunately I do not see any evidence of infection locally or systemically which is great news and overall I am extremely pleased with where we stand today. I do believe he is making good progress he does have his [facility] topical antibiotics with him here today. 11-24-2021 upon evaluation today patient actually showing signs of excellent improvement this appears to be doing much better. Fortunately I do not see any evidence of infection locally or systemically at this time. 12-01-2021 upon evaluation today patient appears to be doing well currently in regard to his wound. He has been tolerating the dressing changes without complication. Fortunately I do not see any evidence of active infection at this time which is great news and overall I am extremely pleased with where we stand today. 12-15-2021 upon evaluation today patient appears to be doing well with regard to his wound. He is showing signs of improvement is slow but nonetheless we are making improvements here. Fortunately I do not see any evidence of active infection locally nor systemically at this time. We are still using the [facility] topical antibiotic over the open area only. 12-29-2021 upon evaluation today patient's wound actually is showing signs of excellent improvement. It has been 2 weeks since I perform any debridement and it definitely shows he has some tissue that needs to be cleaned away but I think we can do so quite easily and readily today. The good news is  I do not see any signs of infection I think he is doing much better in that regard. Overall I am extremely pleased with where we stand. 01-12-2022  upon evaluation today patient appears to be doing well currently in regard to his wound although it is not getting significantly smaller it is also not getting any larger. Fortunately I do not see any evidence of infection locally nor systemically which is great news and overall I am extremely pleased with where things stand currently. 01-19-2022 upon evaluation today patient appears to be doing well currently in regard to his wound. The PolyMem actually seems to have done extremely well for him. Fortunately I do not see any signs of infection locally nor systemically at this time. 01-26-2022 upon evaluation today patient appears to be making progress. Fortunately there does not appear to be any signs of infection which is great news. No fevers, chills, nausea, vomiting, or diarrhea. With that being said this is very slow to heal and although it is smaller I still feel like we may want to try to do something to speed this up I think that a skin substitute could be beneficial, look into Kerecis. 02-02-2022 upon evaluation today patient's wound is actually showing signs of excellent improvement. I do not see any evidence of infection and overall I think that we are headed in the right direction. Fortunately I think that he is tolerating the dressing changes without complication. 02-09-2022 upon evaluation today patient appears to be doing well currently in regard to his wound. It does look a little bit macerated but fortunately does not appear to be showing any signs of significant skin breakdown over the macerated area which is good news. Fortunately I do not see any evidence of active infection locally nor systemically at this point which is great news. 02-23-2022 upon evaluation today patient appears to be doing well currently in regard to his wounds. In fact his area on the ankle is actually showing signs of healing quite nicely. We do have the Apligraf ready today I am hopeful this is going to speed  things up and get this closed much more effectively and quickly. Fortunately I do not see any signs of active infection locally nor systemically at this time which is great news. 03-02-2022 upon evaluation today patient appears to be doing excellent in regard to his wound. He is actually been tolerating the dressing changes without complication the wound on the left medial lower extremity is doing quite well and Apligraf seems to have been extremely beneficial for him. I am extremely pleased with where we stand today. 03/16/2022: The wound measurements are smaller today. He has accumulation of a yellow crust around the edges and slough on the surface. There is a musty odor coming from the wound. He has been getting Apligraf. 03-30-2022 upon evaluation today patient appears to be making progress. In regard to his leg ulcer. This is slow but nonetheless the Apligraf has fed things up. Fortunately I do not see any evidence of infection locally nor systemically at this time. No fevers, chills, nausea, vomiting, or diarrhea. Patient is here for Apligraf #4 today. 04-13-2022 upon evaluation today patient appears to be doing a little bit more poorly in regard to his wound. He actually feels like his wrap may have been a little bit tight. Fortunately there does not appear to be any signs of active infection locally nor systemically which is great news and I am pleased in that regard. 04-20-2022 upon evaluation today  patient appears to be doing better in regard to his wound from the standpoint of swelling he is actually lost 33 pounds on torsemide in the past week his leg is significantly smaller compared to what it has been. Fortunately I do not see any signs of active infection locally nor systemically at this time. 04-27-2022 upon evaluation today patient's wound actually is showing signs of erythema and warmth around the edges of the wound I am actually concerned about the possibility of infection. I think we  probably need to obtain a wound culture and also can recommend based on what we are seeing that we go ahead and have the patient continue to utilize the compression wrapping which I think has been of benefit. 05-04-2022 upon evaluation today patient appears to be doing well currently in regard to his wound. He has been tolerating the dressing changes without complication. Fortunately there does not appear to be any signs of active infection locally or systemically which is great news. 05-11-2022 upon evaluation today patient actually appears to be doing excellent. He has been tolerating the dressing changes we actually were trialing a new medication on him last week and this was the ergo clean Ag dressing. With that being said this actually seems to have done extremely well at this point. KENG, ROMANOS ([id number]) [id number] Page 10 of 12 Fortunately I do not see any signs of active infection locally or systemically at this time. 05-18-2022 upon evaluation today patient appears to be doing okay in regard to his wound I do not see any signs of worsening also notes any signs of significant improvement. Fortunately I do believe that removing in the right direction in general. 05-25-22 upon evaluation today patient appears to be doing well currently in regard to his wound. He has been tolerating the dressing changes without complication. Fortunately I do not see any signs of active infection locally nor systemically which is great news and overall I am extremely pleased with where we stand currently. There does not appear to be any signs of active infection locally nor systemically which is great news. No fevers, chills, nausea, vomiting, or diarrhea. 06-01-2022 upon evaluation today patient actually appears to be making excellent progress in regard to his ankle region. I am very pleased with where we stand I think that he is doing extremely well and very pleased with endoform and  drawtex combination. 06-08-2022 upon evaluation today patient appears to be doing well currently in regard to his wound. He has been making good progress and I feel like practicing improvement in the overall size of the wound. I am very pleased in that regard. Fortunately I do not see any signs of active infection locally nor systemically which is great news. No fevers, chills, nausea, vomiting, or diarrhea. 06-15-2022 upon evaluation today patient appears to be doing well currently in regard to his wound. He has been tolerating the dressing changes without complication. I do believe the endoform has been extremely helpful for him up to this point. I do not see any signs of active infection locally nor systemically which is great news and in general I do believe that we are making pretty good progress here which is excellent news. 06-22-2022 upon evaluation today patient appears to be doing well currently in regard to his wound. He has been tolerating the dressing changes without complication. Fortunately there does not appear to be any signs of active infection at this time which is great news. I think that he is actually  taking good progress with the endoform which is excellent as well. 06-29-2022 upon evaluation patient appears to be doing well currently in regard to his wound. He is actually making good progress towards closure and I am very pleased in that regard. I do not see any evidence of active infection locally nor systemically which is great news and in general I do believe that we are making excellent progress here overall. 07-06-2022 upon evaluation today patient appears to be doing well currently in regard to his wound. He has been tolerating the dressing changes without complication. The good news is he is slowly getting smaller each time I see him. 07-13-2022 upon evaluation today patient appears to be doing well currently in regard to his wound is slowly showing signs of improvement.  Fortunately I do not see any signs of infection at this time. No fevers, chills, nausea, vomiting, or diarrhea. 07-20-2022 upon evaluation today patient appears to be doing well currently in regard to his wound. He has been tolerating the dressing changes without complication. Fortunately I do not see any evidence of active infection locally or systemically which is great news and in general I do believe that we are making headway towards complete closure. This is excellent news. With that being said we are preparing for him to be leaving to go out of town in about a week and a half. For that reason we will going to make a few changes to his dressing today in order to give him something that I think will be easier for him to do when he is out of town. 07-27-2022 upon evaluation today patient appears to be doing well currently in regard to his wound. He has been tolerating the dressing changes without complication. Fortunately there does not appear to be any signs of infection. Fortunately there does not appear to be any signs of infection 08-10-2022 upon evaluation today patient appears to be doing okay in regard to his wound. He actually has been gone for 2 weeks and in the interim unfortunately he did not have result out of the Tegaderm as well as hoping for but nonetheless took good care of his wound. Does not appear to be infected I do think we need to go back to the compression wrap however which I think would definitely benefit him. Fortunately I do not see any signs of active infection at this time which is great news. 08-17-2022 upon evaluation today patient unfortunately appears to be showing signs of infection based on what I see at this time. Fortunately I do not see any evidence of systemic infection though locally I do definitely see some changes that are concerning. I do not think that this is systemic at all. 08-24-2022 upon evaluation today patient appears to be doing well currently in  regard to his wound. He has been tolerating the dressing changes this looks significantly improved compared to last week and very pleased. I do not see any signs of active infection locally or systemically at this time. 08-31-2022 upon evaluation today patient appears to be doing well currently in regard to his wound this is actually showing signs of improvement and actually very pleased with where we stand today. Fortunately I do not see any signs of active infection which is great news. Objective Constitutional Well-nourished and well-hydrated in no acute distress. Vitals Time Taken: 3:29 PM, Height: 71 in, Weight: 350 lbs, BMI: 48.8, Temperature: 98 F, Pulse: 69 bpm, Respiratory Rate: 18 breaths/min, Blood Pressure: 114/79 mmHg. Respiratory normal  breathing without difficulty. Psychiatric this patient is able to make decisions and demonstrates good insight into disease process. Alert and Oriented x 3. pleasant and cooperative. General Notes: Patient's wound bed actually did require sharp debridement clearway necrotic debris I did perform this debridement today he tolerated that without complication debridement the wound bed appears to be doing much better which is great news. I am extremely happy with where we stand currently. and post I do think it is probably time for [place] to switch back to the endoform which has done much better for him in the past I think the [facility] served its purpose but at this time now that the drainage is a lot less I think endoform will be better. Integumentary (Hair, Skin) Wound #7 status is Open. Original cause of wound was Blister. The date acquired was: 07/21/2021. The wound has been in treatment 56 weeks. The wound is located on the Right,Medial Malleolus. The wound measures 4cm length x 1.6cm width x 0.2cm depth; 5.027cm^2 area and 1.005cm^3 volume. There is Fat Layer (Subcutaneous Tissue) exposed. There is no tunneling or undermining noted. There is a  medium amount of serosanguineous drainage noted. There is large (67-100%) red granulation within the wound bed. There is a small (1-33%) amount of necrotic tissue within the wound bed including Adherent Slough. The TYVAN, HOCKLEY ([id number]) [id number] Page 11 of 12 periwound skin appearance did not exhibit: Callus, Crepitus, Excoriation, Induration, Rash, Scarring, Dry/Scaly, Maceration, Atrophie Blanche, Cyanosis, Ecchymosis, Hemosiderin Staining, Mottled, Pallor, Rubor, Erythema. 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 There was a Excisional Skin/Subcutaneous Tissue Debridement with a total area of 5.02 sq cm performed by Lenda Kelp, PA. With the following instrument(s): Curette to remove Viable and Non-Viable tissue/material. Material removed includes Callus, Subcutaneous Tissue, and Slough after achieving pain control using Lidocaine 4% Topical Solution. No specimens were taken. A time out was conducted at 15:55, prior to the start of the procedure. A Minimum amount of bleeding was controlled with Pressure. The procedure was tolerated well with a pain level of 0 throughout and a pain level of 0 following the procedure. Post Debridement Measurements: 4cm length x 1.6cm width x 0.3cm depth; 1.508cm^3 volume. Character of Wound/Ulcer Post Debridement is improved.  Severity of Tissue Post Debridement is: Fat layer exposed. Post procedure Diagnosis Wound #7: Same as Pre-Procedure Plan Follow-up Appointments: Return Appointment in 1 week. - Nurse visit 09/07/22 Return Appointment in 2 weeks. Leonard Schwartz Wednesday 09/14/22 Return appointment in 3 weeks. Leonard Schwartz Wednesday 09/21/2022 Nurse Visit: - ****Wednesday 09/07/2022***** Anesthetic: (In clinic) Topical Lidocaine 5% applied to wound bed - Used in Clinic Prior to debridement Cellular or Tissue Based Products: Cellular or Tissue Based Product Type: - Run IVR for Kerrecis- denied #1 Apligraf applied 02/23/2022 #2 Apligraf applied 03/02/2022 #3 Apligraf applied 03/16/2022 #4 Apligraf applied 03/30/2022 HOLD APLIGRAF this week. Cellular or Tissue Based Product applied to wound bed, secured with steri-strips, cover with Adaptic or Mepitel. (DO NOT REMOVE).  Bathing/ Shower/ Hygiene: May shower with protection but do not get wound dressing(s) wet. Protect dressing(s) with water repellant cover (for example, large plastic bag) or a cast cover and may then take shower. Edema Control - Lymphedema / SCD / Other: Elevate legs to the level of the heart or above for 30 minutes daily and/or when sitting for 3-4 times a day throughout the day. Avoid standing for long periods of time. Patient to wear own compression stockings every day. - apply in the morning and remove at night left leg. Exercise regularly Moisturize legs daily. - apply every night before bed. Compression stocking or Garment 30-40 mm/Hg pressure to: - wear your VIVE compression stocking to left leg. apply in the morning and remove at night. WOUND #7: - Malleolus Wound Laterality: Right, Medial Cleanser: Soap and Water 1 x Per Week/30 Days Discharge Instructions: May shower and wash wound with dial antibacterial soap and water prior to dressing change. Cleanser: Vashe 5.8 (oz) 1 x Per Week/30 Days Discharge Instructions: Cleanse the wound with Vashe prior to  applying a clean dressing using gauze sponges, not tissue or cotton balls. Peri-Wound Care: Sween Lotion (Moisturizing lotion) 1 x Per Week/30 Days Discharge Instructions: Apply moisturizing lotion as directed Topical: Gentamicin 1 x Per Week/30 Days Discharge Instructions: As directed by physician Prim Dressing: Endoform 2x2 in (Dispense As Written) 1 x Per Week/30 Days ary Discharge Instructions: Moisten with saline Secondary Dressing: Zetuvit Plus 4x8 in 1 x Per Week/30 Days Discharge Instructions: Apply over primary dressing as directed. Com pression Wrap: Urgo K2, (equivalent to a 4 layer) two layer compression system, regular 1 x Per Week/30 Days Discharge Instructions: Apply Urgo K2 as directed (alternative to 4 layer compression). 1. I am not going to recommend that we have the patient actually switch over to the endoform which I think should do well. 2. I am going to recommend that we continue with topical gentamicin. 3. I am also going to recommend that the patient should continue to utilize the Zetuvit to cover followed by the Urgo K2 compression wrap which is doing a good job as well. We will see patient back for reevaluation in 1 week here in the clinic. If anything worsens or changes patient will contact our office for additional recommendations. NICKELAS, CHEVRIER ([id number]) [id number] Page 12 of 12 Electronic Signature(s) Signed: 08/31/2022 7:54:18 PM By: Shawn Stall RN, BSN Signed: 09/02/2022 1:58:20 PM By: Allen Derry PA-C Previous Signature: 08/31/2022 4:12:53 PM Version By: Allen Derry PA-C Entered By: Shawn Stall on 08/31/2022 16:39:42 -------------------------------------------------------------------------------- SuperBill Details Patient Name: Date of Service: Decamp, [name] 08/31/2022 Medical Record Number: [id number] Patient Account Number: [id number] Date of Birth/Sex: Treating RN: 06-Oct-1955 (67 y.o. M) Primary Care Provider: [name] Other Clinician: Referring Provider: Treating Provider/Extender: Alver Fisher in Treatment: 56 Diagnosis Coding ICD-10 Codes Code Description I89.0 Lymphedema, not elsewhere classified I87.333 Chronic venous hypertension (idiopathic) with ulcer and inflammation of bilateral lower extremity L97.828 Non-pressure chronic ulcer of other part of left lower leg with other specified severity L97.818 Non-pressure chronic ulcer of other part of right lower leg with other specified severity E11.621 Type 2 diabetes mellitus with foot ulcer L97.518 Non-pressure chronic ulcer of other part of right foot with other specified severity Facility Procedures : 3 CPT4 Code: 6301601 Description: 11042 - DEB SUBQ TISSUE 20 SQ CM/< ICD-10 Diagnosis Description L97.818 Non-pressure chronic ulcer of other part of right lower leg with other specified Modifier: severity Quantity: 1  Physician Procedures : CPT4 Code Description Modifier [id number] 11042 - WC PHYS SUBQ TISS 20 SQ CM ICD-10 Diagnosis Description L97.818 Non-pressure chronic ulcer of other part of right lower leg with other specified severity Quantity: 1 Electronic Signature(s) Signed: 08/31/2022 4:15:17 PM By: Allen Derry PA-C Entered By: Allen Derry on 08/31/2022 13:15:16

## 2022-09-01 NOTE — Progress Notes (Signed)
LATHEN, CUCCO ([id number]) [id number] Page 1 of 6 Visit Report for 08/31/2022 Arrival Information Details Patient Name: Date of Service: [name], [name] 08/31/2022 3:00 PM Medical Record Number: [id number] Patient Account Number: [id number] Date of Birth/Sex: Treating RN: [birth year] (67 y.o. M) Primary Care Artemus Romanoff: [name] Other Clinician: Referring Icie Kuznicki: Treating Maliki Gignac/Extender: Alver Fisher in Treatment: 56 Visit Information History Since Last Visit Added or deleted any medications: No Patient Arrived: [name] Any new allergies or adverse reactions: No Arrival Time: 15:16 Had a fall or experienced change in No Accompanied By: self activities of daily living that may affect Transfer Assistance: None risk of falls: Patient Identification Verified: Yes Signs or symptoms of abuse/neglect since last visito No Secondary Verification Process Completed: Yes Hospitalized since last visit: No Patient Requires Transmission-Based Precautions: No Implantable device outside of the clinic excluding No Patient Has Alerts: Yes cellular tissue based products placed in the center Patient Alerts: Patient on Blood Thinner since last visit: 01/2021 ABI L 1.13 R 1.06 Has Dressing in Place as Prescribed: Yes 01/2021 TBI L amp R 0.8 Has Compression in Place as Prescribed: Yes Pain Present Now: No Electronic Signature(s) Signed: 09/01/2022 5:02:38 PM By: Thayer Dallas Entered By: Thayer Dallas on 08/31/2022 12:20:19 -------------------------------------------------------------------------------- Encounter Discharge Information Details Patient Name: Date of Service: [name], [name] 08/31/2022 3:00 PM Medical Record Number: [id number] Patient Account Number: [id number] Date of Birth/Sex: Treating RN: 1955-06-28 (67 y.o. Dianna Limbo Primary Care Tanika Bracco: [name] Other Clinician: Referring Hadyn Blanck: Treating Neisha Hinger/Extender:  Alver Fisher in Treatment: 30 Encounter Discharge Information Items Post Procedure Vitals Discharge Condition: Stable Temperature (F): 98 Ambulatory Status: Walker Pulse (bpm): 69 Discharge Destination: Home Respiratory Rate (breaths/min): 18 Transportation: Private Auto Blood Pressure (mmHg): 114/74 Accompanied By: self Schedule Follow-up Appointment: Yes Clinical Summary of Care: Patient Declined Electronic Signature(s) Signed: 08/31/2022 5:26:36 PM By: Karie Schwalbe RN Entered By: Karie Schwalbe on 08/31/2022 14:24:24 Hilscher, [name] ([id number]) [id number] Page 2 of 6 -------------------------------------------------------------------------------- Lower Extremity Assessment Details Patient Name: Date of Service: [name], [name] 08/31/2022 3:00 PM Medical Record Number: [id number] Patient Account Number: [id number] Date of Birth/Sex: Treating RN: [birth year] (67 y.o. M) Primary Care Jaceyon Strole: [name] Other Clinician: Referring Iana Buzan: Treating Delfin Squillace/Extender: Alver Fisher in Treatment: 56 Edema Assessment Assessed: [Left: No] [Right: No] Edema: [Left: N] [Right: o] Calf Left: Right: Point of Measurement: 31 cm From Medial Instep 39 cm Ankle Left: Right: Point of Measurement: 11 cm From Medial Instep 25.7 cm Vascular Assessment Extremity colors, hair growth, and conditions: Extremity Color: [Right:Normal] Hair Growth on Extremity: [Right:No] Temperature of Extremity: [Right:Warm] Capillary Refill: [Right:< 3 seconds] Dependent Rubor: [Right:No Yes] Electronic Signature(s) Signed: 09/01/2022 5:02:38 PM By: Thayer Dallas Entered By: Thayer Dallas on 08/31/2022 12:31:03 -------------------------------------------------------------------------------- Multi-Disciplinary Care Plan Details Patient Name: Date of Service: [name], [name] Grooms [name]. 08/31/2022 3:00 PM Medical Record Number:  [id number] Patient Account Number: [id number] Date of Birth/Sex: Treating RN: 03/08/1955 (67 y.o. Dianna Limbo Primary Care Laniyah Rosenwald: [name] Other Clinician: Referring Harlynn Kimbell: Treating Perrin Eddleman/Extender: Alver Fisher in Treatment: 42 Multidisciplinary Care Plan reviewed with physician Active Inactive Pain, Acute or Chronic Nursing Diagnoses: Pain, acute or chronic: actual or potential Potential alteration in comfort, pain Goals: Patient will verbalize adequate pain control and receive pain control interventions during procedures as needed Date Initiated: 08/04/2021 Target Resolution Date: 01/10/2023 AARONN, CLEMONS ([id number]) [phone].pdf Page 3 of 6 Goal Status:  Active Patient/caregiver will verbalize comfort level met Date Initiated: 08/04/2021 Date Inactivated: 07/27/2022 Target Resolution Date: 09/10/2022 Goal Status: Met Interventions: Encourage patient to take pain medications as prescribed Provide education on pain management Reposition patient for comfort Treatment Activities: Administer pain control measures as ordered : 08/04/2021 Notes: Electronic Signature(s) Signed: 08/31/2022 5:26:36 PM By: Karie Schwalbe RN Entered By: Karie Schwalbe on 08/31/2022 13:48:11 -------------------------------------------------------------------------------- Pain Assessment Details Patient Name: Date of Service: [name] Craven [name]. 08/31/2022 3:00 PM Medical Record Number: [id number] Patient Account Number: [id number] Date of Birth/Sex: Treating RN: [birth year] (67 y.o. M) Primary Care Guiliana Shor: [name] Other Clinician: Referring Analiah Drum: Treating Demonta Wombles/Extender: Alver Fisher in Treatment: 56 Active Problems Location of Pain Severity and Description of Pain Patient Has Paino No Site Locations Pain Management and Medication Current Pain Management: Electronic Signature(s) Signed: 09/01/2022  5:02:38 PM By: Thayer Dallas Entered By: Thayer Dallas on 08/31/2022 12:30:37 Ridolfi, Jlynn [name] ([id number]) [id number].pdf Page 4 of 6 -------------------------------------------------------------------------------- Patient/Caregiver Education Details Patient Name: Date of Service: [name], [name] 8/21/2024andnbsp3:00 PM Medical Record Number: [id number] Patient Account Number: [id number] Date of Birth/Gender: Treating RN: [birth year] (67 y.o. Dianna Limbo Primary Care Physician: [name] Other Clinician: Referring Physician: Treating Physician/Extender: Alver Fisher in Treatment: 76 Education Assessment Education Provided To: Patient Education Topics Provided Wound/Skin Impairment: Methods: Explain/Verbal Responses: Return demonstration correctly Electronic Signature(s) Signed: 08/31/2022 5:26:36 PM By: Karie Schwalbe RN Entered By: Karie Schwalbe on 08/31/2022 13:48:27 -------------------------------------------------------------------------------- Wound Assessment Details Patient Name: Date of Service: [name], [name] Grooms [name]. 08/31/2022 3:00 PM Medical Record Number: [id number] Patient Account Number: [id number] Date of Birth/Sex: Treating RN: 08/11/1955 (67 y.o. M) Primary Care Sabria Florido: [name] Other Clinician: Referring Nona Gracey: Treating Lorraine Terriquez/Extender: Alver Fisher in Treatment: 56 Wound Status Wound Number: 7 Primary Venous Leg Ulcer Etiology: Wound Location: Right, Medial Malleolus Secondary Lymphedema Wounding Event: Blister Etiology: Date Acquired: 07/21/2021 Wound Open Weeks Of Treatment: 56 Status: Clustered Wound: No Comorbid Sleep Apnea, Arrhythmia, Congestive Heart Failure, History: Hypertension, Hypotension, Peripheral Arterial Disease, Peripheral Venous Disease, Type II Diabetes, Gout, Osteoarthritis Photos Wound Measurements Length: (cm) 4 Width: (cm) 1.6 Depth: (cm)  0.2 Area: (cm) 5.027 Volume: (cm) 1.005 [name], [name] ([id number]) Wound Description Classification: Full Thickness With Exposed Suppo Exudate Amount: Medium Exudate Type: Serosanguineous Exudate Color: red, brown rt Structures % Reduction in Area: 86.9% % Reduction in Volume: 96.3% Epithelialization: Small (1-33%) Tunneling: No Undermining: No [phone].pdf Page 5 of 6 Wound Bed Granulation Amount: Large (67-100%) Exposed Structure Granulation Quality: Red Fat Layer (Subcutaneous Tissue) Exposed: Yes Necrotic Amount: Small (1-33%) Necrotic Quality: Adherent Slough Periwound Skin Texture Texture Color No Abnormalities Noted: No No Abnormalities Noted: No Callus: No Atrophie Blanche: No Crepitus: No Cyanosis: No Excoriation: No Ecchymosis: No Induration: No Erythema: No Rash: No Hemosiderin Staining: No Scarring: No Mottled: No Pallor: No Moisture Rubor: No No Abnormalities Noted: No Dry / Scaly: No Maceration: No Treatment Notes Wound #7 (Malleolus) Wound Laterality: Right, Medial Cleanser Soap and Water Discharge Instruction: May shower and [name] wound with dial antibacterial soap and water prior to dressing change. Vashe 5.8 (oz) Discharge Instruction: Cleanse the wound with Vashe prior to applying a clean dressing using gauze sponges, not tissue or cotton balls. Peri-Wound Care Sween Lotion (Moisturizing lotion) Discharge Instruction: Apply moisturizing lotion as directed Topical Gentamicin Discharge Instruction: As directed by physician Primary Dressing Endoform 2x2 in Discharge Instruction: Moisten with saline Secondary Dressing Zetuvit Plus 4x8 in Discharge  Instruction: Apply over primary dressing as directed. Secured With Compression Wrap Urgo K2, (equivalent to a 4 layer) two layer compression system, regular Discharge Instruction: Apply Urgo K2 as directed (alternative to 4 layer compression). Compression  Stockings Add-Ons Electronic Signature(s) Signed: 09/01/2022 5:02:38 PM By: Thayer Dallas Entered By: Thayer Dallas on 08/31/2022 12:35:11 [name], [name] ([id number]) [phone].pdf Page 6 of 6 -------------------------------------------------------------------------------- Vitals Details Patient Name: Date of Service: [name], [name] 08/31/2022 3:00 PM Medical Record Number: [id number] Patient Account Number: [id number] Date of Birth/Sex: Treating RN: 04-Sep-1955 (67 y.o. M) Primary Care Genessa Beman: [name] Other Clinician: Referring Natori Gudino: Treating Brewer Hitchman/Extender: Alver Fisher in Treatment: 56 Vital Signs Time Taken: 15:29 Temperature (F): 98 Height (in): 71 Pulse (bpm): 69 Weight (lbs): 350 Respiratory Rate (breaths/min): 18 Body Mass Index (BMI): 48.8 Blood Pressure (mmHg): 114/79 Reference Range: 80 - 120 mg / dl Electronic Signature(s) Signed: 09/01/2022 5:02:38 PM By: Thayer Dallas Entered By: Thayer Dallas on 08/31/2022 12:30:23

## 2022-09-05 ENCOUNTER — Ambulatory Visit
Admission: RE | Admit: 2022-09-05 | Discharge: 2022-09-05 | Disposition: A | Discharge status: Home / Self Care | Payer: Medicare Other | Source: Ambulatory Visit | Attending: Pulmonary Disease | Admitting: Pulmonary Disease

## 2022-09-05 DIAGNOSIS — R918 Other nonspecific abnormal finding of lung field: Secondary | ICD-10-CM | POA: Diagnosis not present

## 2022-09-05 DIAGNOSIS — I517 Cardiomegaly: Secondary | ICD-10-CM | POA: Diagnosis not present

## 2022-09-05 DIAGNOSIS — J849 Interstitial pulmonary disease, unspecified: Secondary | ICD-10-CM

## 2022-09-05 DIAGNOSIS — R590 Localized enlarged lymph nodes: Secondary | ICD-10-CM | POA: Diagnosis not present

## 2022-09-07 ENCOUNTER — Encounter (HOSPITAL_BASED_OUTPATIENT_CLINIC_OR_DEPARTMENT_OTHER): Payer: Medicare Other | Admitting: Internal Medicine

## 2022-09-07 DIAGNOSIS — I2510 Atherosclerotic heart disease of native coronary artery without angina pectoris: Secondary | ICD-10-CM | POA: Diagnosis not present

## 2022-09-07 DIAGNOSIS — L97818 Non-pressure chronic ulcer of other part of right lower leg with other specified severity: Secondary | ICD-10-CM | POA: Diagnosis not present

## 2022-09-07 DIAGNOSIS — I87333 Chronic venous hypertension (idiopathic) with ulcer and inflammation of bilateral lower extremity: Secondary | ICD-10-CM | POA: Diagnosis not present

## 2022-09-07 DIAGNOSIS — L97518 Non-pressure chronic ulcer of other part of right foot with other specified severity: Secondary | ICD-10-CM | POA: Diagnosis not present

## 2022-09-07 DIAGNOSIS — I890 Lymphedema, not elsewhere classified: Secondary | ICD-10-CM | POA: Diagnosis not present

## 2022-09-07 DIAGNOSIS — L97828 Non-pressure chronic ulcer of other part of left lower leg with other specified severity: Secondary | ICD-10-CM | POA: Diagnosis not present

## 2022-09-07 DIAGNOSIS — M1990 Unspecified osteoarthritis, unspecified site: Secondary | ICD-10-CM | POA: Diagnosis not present

## 2022-09-07 DIAGNOSIS — I509 Heart failure, unspecified: Secondary | ICD-10-CM | POA: Diagnosis not present

## 2022-09-07 DIAGNOSIS — I872 Venous insufficiency (chronic) (peripheral): Secondary | ICD-10-CM | POA: Diagnosis not present

## 2022-09-07 DIAGNOSIS — Z7901 Long term (current) use of anticoagulants: Secondary | ICD-10-CM | POA: Diagnosis not present

## 2022-09-07 DIAGNOSIS — M109 Gout, unspecified: Secondary | ICD-10-CM | POA: Diagnosis not present

## 2022-09-07 DIAGNOSIS — I110 Hypertensive heart disease with heart failure: Secondary | ICD-10-CM | POA: Diagnosis not present

## 2022-09-07 DIAGNOSIS — E11621 Type 2 diabetes mellitus with foot ulcer: Secondary | ICD-10-CM | POA: Diagnosis not present

## 2022-09-07 DIAGNOSIS — E1151 Type 2 diabetes mellitus with diabetic peripheral angiopathy without gangrene: Secondary | ICD-10-CM | POA: Diagnosis not present

## 2022-09-07 NOTE — Progress Notes (Signed)
DANEN, NICANOR ([id number]) [id number] Page 1 of 1 Visit Report for 09/07/2022 SuperBill Details Patient Name: Date of Service: [name], [name] 09/07/2022 Medical Record Number: [id number] Patient Account Number: [id number] Date of Birth/Sex: Treating RN: 1955/07/19 (67 y.o. M) Primary Care Provider: [name] Other Clinician: Thayer Dallas Referring Provider: Treating Provider/Extender: Mikki Santee in Treatment: 57 Diagnosis Coding ICD-10 Codes Code Description I89.0 Lymphedema, not elsewhere classified I87.333 Chronic venous hypertension (idiopathic) with ulcer and inflammation of bilateral lower extremity L97.828 Non-pressure chronic ulcer of other part of left lower leg with other specified severity L97.818 Non-pressure chronic ulcer of other part of right lower leg with other specified severity E11.621 Type 2 diabetes mellitus with foot ulcer L97.518 Non-pressure chronic ulcer of other part of right foot with other specified severity Facility Procedures CPT4 Code Description Modifier Quantity [id number] (Facility Use Only) [phone] - APPLY MULTLAY COMPRS LWR RT LEG 1 Electronic Signature(s) Signed: 09/07/2022 4:53:59 PM By: Thayer Dallas Signed: 09/07/2022 5:08:25 PM By: Baltazar Najjar MD Entered By: Thayer Dallas on 09/07/2022 15:52:37

## 2022-09-07 NOTE — Progress Notes (Signed)
[name], [name] ([id number]) [id number] Page 1 of 3 Visit Report for 09/07/2022 Arrival Information Details Patient Name: Date of Service: [name], [name] 09/07/2022 3:00 PM Medical Record Number: [id number] Patient Account Number: [id number] Date of Birth/Sex: Treating RN: April 28, 1955 (67 y.o. M) Primary Care Juanda Luba: [name] Other Clinician: Referring Thresea Doble: Treating Eiza Canniff/Extender: Mikki Santee in Treatment: 57 Visit Information History Since Last Visit Added or deleted any medications: No Patient Arrived: [name] Any new allergies or adverse reactions: No Arrival Time: 15:15 Had a fall or experienced change in No Accompanied By: self activities of daily living that may affect Transfer Assistance: None risk of falls: Patient Identification Verified: Yes Signs or symptoms of abuse/neglect since last visito No Secondary Verification Process Completed: Yes Hospitalized since last visit: No Patient Requires Transmission-Based Precautions: No Implantable device outside of the clinic excluding No Patient Has Alerts: Yes cellular tissue based products placed in the center Patient Alerts: Patient on Blood Thinner since last visit: 01/2021 ABI L 1.13 R 1.06 Has Dressing in Place as Prescribed: Yes 01/2021 TBI L amp R 0.8 Has Compression in Place as Prescribed: Yes Pain Present Now: No Electronic Signature(s) Signed: 09/07/2022 4:53:59 PM By: Thayer Dallas Entered By: Thayer Dallas on 09/07/2022 15:50:07 -------------------------------------------------------------------------------- Encounter Discharge Information Details Patient Name: Date of Service: [name], [name] 09/07/2022 3:00 PM Medical Record Number: [id number] Patient Account Number: [id number] Date of Birth/Sex: Treating RN: Jul 11, 1955 (67 y.o. M) Primary Care Seira Cody: [name] Other Clinician: Thayer Dallas Referring Eliu Batch: Treating Luanna Weesner/Extender:  Mikki Santee in Treatment: 2 Encounter Discharge Information Items Discharge Condition: Stable Ambulatory Status: Walker Discharge Destination: Home Transportation: Private Auto Accompanied By: self Schedule Follow-up Appointment: Yes Clinical Summary of Care: Electronic Signature(s) Signed: 09/07/2022 4:53:59 PM By: Thayer Dallas Entered By: Thayer Dallas on 09/07/2022 15:51:42 Crescenzo, Nazair D ([id number]) [id number] Page 2 of 3 -------------------------------------------------------------------------------- Patient/Caregiver Education Details Patient Name: Date of Service: [name], [name] 8/28/2024andnbsp3:00 PM Medical Record Number: [id number] Patient Account Number: [id number] Date of Birth/Gender: Treating RN: [birth year] (67 y.o. M) Primary Care Physician: [name] Other Clinician: Thayer Dallas Referring Physician: Treating Physician/Extender: Mikki Santee in Treatment: 40 Education Assessment Education Provided To: Patient Education Topics Provided Electronic Signature(s) Signed: 09/07/2022 4:53:59 PM By: Thayer Dallas Entered By: Thayer Dallas on 09/07/2022 15:51:24 -------------------------------------------------------------------------------- Wound Assessment Details Patient Name: Date of Service: [name], [name] 09/07/2022 3:00 PM Medical Record Number: [id number] Patient Account Number: [id number] Date of Birth/Sex: Treating RN: January 04, 1956 (67 y.o. M) Primary Care Roxan Yamamoto: [name] Other Clinician: Referring Odin Mariani: Treating Taiylor Virden/Extender: Mikki Santee in Treatment: 57 Wound Status Wound Number: 7 Primary Etiology: Venous Leg Ulcer Wound Location: Right, Medial Malleolus Secondary Etiology: Lymphedema Wounding Event: Blister Wound Status: Open Date Acquired: 07/21/2021 Weeks Of Treatment: 57 Clustered Wound: No Wound Measurements Length:  (cm) 4 Width: (cm) 1.6 Depth: (cm) 0.2 Area: (cm) 5.027 Volume: (cm) 1.005 % Reduction in Area: 86.9% % Reduction in Volume: 96.3% Wound Description Classification: Full Thickness With Exposed Support S Exudate Amount: Medium Exudate Type: Serosanguineous Exudate Color: red, brown tructures Periwound Skin Texture Texture Color No Abnormalities Noted: No No Abnormalities Noted: No Moisture No Abnormalities Noted: No Treatment Notes DEVLEN, STURKIE D ([id number]) [id number] Page 3 of 3 Wound #7 (Malleolus) Wound Laterality: Right, Medial Cleanser Soap and Water Discharge Instruction: May shower and wash wound with dial antibacterial soap and water prior to dressing change. Vashe 5.8 (oz) Discharge Instruction: Cleanse the  wound with Vashe prior to applying a clean dressing using gauze sponges, not tissue or cotton balls. Peri-Wound Care Sween Lotion (Moisturizing lotion) Discharge Instruction: Apply moisturizing lotion as directed Topical Gentamicin Discharge Instruction: As directed by physician Primary Dressing Endoform 2x2 in Discharge Instruction: Moisten with saline Secondary Dressing Zetuvit Plus 4x8 in Discharge Instruction: Apply over primary dressing as directed. Secured With Compression Wrap Urgo K2, (equivalent to a 4 layer) two layer compression system, regular Discharge Instruction: Apply Urgo K2 as directed (alternative to 4 layer compression). Compression Stockings Add-Ons Electronic Signature(s) Signed: 09/07/2022 4:53:59 PM By: Thayer Dallas Entered By: Thayer Dallas on 09/07/2022 15:50:36 -------------------------------------------------------------------------------- Vitals Details Patient Name: Date of Service: [name], [name] 09/07/2022 3:00 PM Medical Record Number: [id number] Patient Account Number: [id number] Date of Birth/Sex: Treating RN: [birth year] (67 y.o. M) Primary Care Okley Magnussen: [name] Other  Clinician: Referring Conception Doebler: Treating Omauri Boeve/Extender: Mikki Santee in Treatment: 57 Vital Signs Time Taken: 15:50 Reference Range: 80 - 120 mg / dl Height (in): 71 Weight (lbs): 350 Body Mass Index (BMI): 48.8 Electronic Signature(s) Signed: 09/07/2022 4:53:59 PM By: Thayer Dallas Entered By: Thayer Dallas on 09/07/2022 15:50:18

## 2022-09-14 ENCOUNTER — Encounter (HOSPITAL_BASED_OUTPATIENT_CLINIC_OR_DEPARTMENT_OTHER): Payer: Medicare Other | Attending: Physician Assistant | Admitting: Physician Assistant

## 2022-09-14 DIAGNOSIS — E11621 Type 2 diabetes mellitus with foot ulcer: Secondary | ICD-10-CM | POA: Diagnosis not present

## 2022-09-14 DIAGNOSIS — L97518 Non-pressure chronic ulcer of other part of right foot with other specified severity: Secondary | ICD-10-CM | POA: Diagnosis not present

## 2022-09-14 DIAGNOSIS — E1151 Type 2 diabetes mellitus with diabetic peripheral angiopathy without gangrene: Secondary | ICD-10-CM | POA: Insufficient documentation

## 2022-09-14 DIAGNOSIS — I110 Hypertensive heart disease with heart failure: Secondary | ICD-10-CM | POA: Insufficient documentation

## 2022-09-14 DIAGNOSIS — L97312 Non-pressure chronic ulcer of right ankle with fat layer exposed: Secondary | ICD-10-CM | POA: Diagnosis not present

## 2022-09-14 DIAGNOSIS — I2510 Atherosclerotic heart disease of native coronary artery without angina pectoris: Secondary | ICD-10-CM | POA: Diagnosis not present

## 2022-09-14 DIAGNOSIS — L97818 Non-pressure chronic ulcer of other part of right lower leg with other specified severity: Secondary | ICD-10-CM | POA: Diagnosis not present

## 2022-09-14 DIAGNOSIS — L97828 Non-pressure chronic ulcer of other part of left lower leg with other specified severity: Secondary | ICD-10-CM | POA: Diagnosis not present

## 2022-09-14 DIAGNOSIS — I890 Lymphedema, not elsewhere classified: Secondary | ICD-10-CM | POA: Insufficient documentation

## 2022-09-14 DIAGNOSIS — I509 Heart failure, unspecified: Secondary | ICD-10-CM | POA: Insufficient documentation

## 2022-09-14 DIAGNOSIS — I872 Venous insufficiency (chronic) (peripheral): Secondary | ICD-10-CM | POA: Diagnosis not present

## 2022-09-14 DIAGNOSIS — I87333 Chronic venous hypertension (idiopathic) with ulcer and inflammation of bilateral lower extremity: Secondary | ICD-10-CM | POA: Diagnosis not present

## 2022-09-14 DIAGNOSIS — Z6841 Body Mass Index (BMI) 40.0 and over, adult: Secondary | ICD-10-CM | POA: Insufficient documentation

## 2022-09-14 NOTE — Progress Notes (Signed)
CUONG, SOLOFF ([id number]) [id number] Page 1 of 13 Visit Report for 09/14/2022 Chief Complaint Document Details Patient Name: Date of Service: [name], [name] 09/14/2022 3:45 PM Medical Record Number: [id number] Patient Account Number: [id number] Date of Birth/Sex: Treating RN: 30-Jul-1955 (67 y.o. M) Primary Care Provider: [name] Other Clinician: Referring Provider: Treating Provider/Extender: Alver Fisher in Treatment: 78 Information Obtained from: Patient Chief Complaint 08/04/2021; patient returns to clinic with bilateral leg wounds as well as areas on the right foot Electronic Signature(s) Signed: 09/14/2022 3:49:04 PM By: Allen Derry PA-C Entered By: Allen Derry on 09/14/2022 12:49:04 -------------------------------------------------------------------------------- Debridement Details Patient Name: Date of Service: [name], [name] 09/14/2022 3:45 PM Medical Record Number: [id number] Patient Account Number: [id number] Date of Birth/Sex: Treating RN: [birth year] (67 y.o. Harlon Flor, Millard.Loa Primary Care Provider: [name] Other Clinician: Referring Provider: Treating Provider/Extender: Alver Fisher in Treatment: 58 Debridement Performed for Assessment: Wound #7 Right,Medial Malleolus Performed By: Physician Lenda Kelp, PA Debridement Type: Debridement Severity of Tissue Pre Debridement: Fat layer exposed Level of Consciousness (Pre-procedure): Awake and Alert Pre-procedure Verification/Time Out Yes - 16:30 Taken: Start Time: 16:31 Pain Control: Lidocaine 4% T opical Solution Percent of Wound Bed Debrided: 100% T Area Debrided (cm): otal 4.8 Tissue and other material debrided: Viable, Non-Viable, Slough, Subcutaneous, Skin: Dermis , Skin: Epidermis, Slough Level: Skin/Subcutaneous Tissue Debridement Description: Excisional Instrument: Curette Bleeding: Minimum Hemostasis Achieved: Pressure End  Time: 16:38 Procedural Pain: 0 Post Procedural Pain: 0 Response to Treatment: Procedure was tolerated well Level of Consciousness (Post- Awake and Alert procedure): Post Debridement Measurements of Total Wound Length: (cm) 3.4 Width: (cm) 1.8 Depth: (cm) 0.3 Volume: (cm) 1.442 Character of Wound/Ulcer Post Debridement: Improved GATLIN, GUTHRIE [name] ([id number]) [id number] Page 2 of 13 Severity of Tissue Post Debridement: Fat layer exposed Post Procedure Diagnosis Same as Pre-procedure Electronic Signature(s) Signed: 09/14/2022 6:09:26 PM By: Shawn Stall RN, BSN Signed: 09/14/2022 6:11:34 PM By: Allen Derry PA-C Entered By: Shawn Stall on 09/14/2022 13:38:34 -------------------------------------------------------------------------------- HPI Details Patient Name: Date of Service: [name], [name] 09/14/2022 3:45 PM Medical Record Number: [id number] Patient Account Number: [id number] Date of Birth/Sex: Treating RN: [birth year] (67 y.o. M) Primary Care Provider: [name] Other Clinician: Referring Provider: Treating Provider/Extender: Alver Fisher in Treatment: 50 History of Present Illness HPI Description: ADMISSION 03/22/2021 This is a 67 year old man with a past medical history significant for diabetes type 2, congestive heart failure, peripheral arterial disease, morbid obesity, venous insufficiency, and coronary artery disease. He has been followed by Dr. Loreta Ave in podiatry, who performed a transmetatarsal amputation on the left foot in August 2022. He had issues healing that wound, but based upon Dr. Kenna Gilbert notes, ultimately the TMA wound healed. During his recovery from that surgery, however, ulcers opened up over the DIP joint of the right second and third toe. These have apparently closed and reopened multiple times. It sounds like one of the issues has been moisture accumulation and maceration of the tissues causing them to  reopen. At his last visit with Dr. Loreta Ave, on March 01, 2021, there continues to be problems with moisture and he was referred to wound care for further evaluation and management. He had a formal aortogram with runoff performed prior to his TMA. The findings are copied here: Patient has inline flow to both feet with no significant flow-limiting lesion that would be amenable to percutaneous or open revascularization. He does have an element of  small vessel disease and has a short segment occlusion of the distal anterior tibial/dorsalis pedis artery on the left foot but does have posterior tibial artery flow. Would recommend management of wounds with amputation of toes 2 and 3 on the right foot if the wounds do not heal and deteriorate. Transmetatarsal amputation on the left side has as good a blood supply as it is going to get and hopefully this will heal in the future. Formal ABIs were done in January 2023. They are normal bilaterally. ABI Findings: +---------+------------------+-----+----------+--------+ Right Rt Pressure (mmHg)IndexWaveform Comment  +---------+------------------+-----+----------+--------+ Brachial 160     +---------+------------------+-----+----------+--------+ PTA 192 1.06 monophasic  +---------+------------------+-----+----------+--------+ DP 159 0.88 monophasic  +---------+------------------+-----+----------+--------+ Great T oe145 0.80    +---------+------------------+-----+----------+--------+ +--------+------------------+-----+---------+-------+ Left Lt Pressure (mmHg)IndexWaveform Comment +--------+------------------+-----+---------+-------+ [id number]     +--------+------------------+-----+---------+-------+ PTA 204 1.13 triphasic  +--------+------------------+-----+---------+-------+ DP 194 1.07 biphasic    +--------+------------------+-----+---------+-------+ +-------+-----------+-----------+------------+------------+ ABI/TBIT oday's ABIT oday's TBIPrevious ABIPrevious TBI +-------+-----------+-----------+------------+------------+ Right 1.06 0.8 1.26 0.65  +-------+-----------+-----------+------------+------------+ Left 1.13 amputation 1.15 amputation  +-------+-----------+-----------+------------+------------+ Previous ABI on 08/06/20 at [facility] Pedal pressures falsely elevated due to medial calcification. MASAI, PAGANINI ([id number]) [id number] Page 3 of 13 Summary: Right: Resting right ankle-brachial index is within normal range. The right toe-brachial index is normal. Left: Resting left ankle-brachial index is within normal range. READMISSION 08/04/2021 Mr. Gootee is now a 67 year old man who I remember from this clinic many years ago I think he had a right lower extremity predominantly venous wound at the time. He was here for 1 visit in March of this year had wounds on his right second and third toes we apparently dressed them many and they healed so he did not come back. He is listed in [place] is being a diabetic although the patient denies this says he is verified it with his primary doctor. In any case over the last several weeks or so according the patient although these wounds look somewhat more chronic than that he has developed predominantly large wounds on the right medial and right lateral ankle smaller areas on the left leg and areas on the dorsal aspect of his right second and third toes. Its not clear how he has been dressing these. 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 The patient has known PAD. He had an angiogram in August 2022 at which time he had nonhealing wounds in both feet. On the left his major vessels in the thigh were all patent. He  had three-vessel patent to the level of the ankle. He had a very short occlusion in the left anterior tibial. On the right lower extremity the major vessels in his thigh were all patent. He had three-vessel runoff to the foot sluggish filling of the anterior tibial artery. He was felt to have some component of small vessel disease but nothing that was amenable or needed revascularization. It was recommended that he have amputation of the second and third toes on the right foot if they did not heal He has been following with Dr. Loreta Ave of podiatry. Dr. Loreta Ave got him juxta lite stockings although I do not think he had them on properly he has uncontrolled edema in both legs Past medical history includes type 2 diabetes [although the patient really denies this], left TMA in 2022,lower extremity wounds in fact attendance at this clinic in 2009-2010, A-fib on Eliquis, chronic venous insufficiency with secondary lymphedema history of non-Hodgkin's lymphoma. 08-11-2021 upon evaluation  today patient presents for follow-up evaluation he was seen last Wednesday initially for inspection here in our clinic. With that being said he tells me that he unfortunately has been having a lot of drainage and is actually coming through his wrap. Fortunately I do not see any evidence of active infection locally or systemically at this time which is great news. No fevers, chills, nausea, vomiting, or diarrhea. With that being said there does appear to be some evidence of local infection based on what I am seeing today. 08-18-2021 upon evaluation today patient appears to be doing okay currently in regard to his wounds with that being said that he is doing much better but still has a long ways to go to get to where he wanted to be. I think the infection is significantly improved. He has another week of the antibiotic at this point. 08-25-2021 upon evaluation today patient's wounds are actually doing decently well he has erythema has  significantly improved. I think the cellulitis is under controlled I am going to continue him on 2 more weeks of the Levaquin at 500 mg this is a lower dose but I am hoping it will be better for him. 09-08-2021 upon evaluation today patient appears to be doing excellent in regard to his wounds. Since I last saw him he was actually in the hospital where he ended up having a pacemaker put in. Subsequently he tells me that he is actually doing quite well although they were unsure whether they were going to do it due to the fact that he had the wounds on his legs. And then I am glad they did anything seem to be doing well. 09-15-2021 upon evaluation patient's wounds are actually showing signs of improvement. The right side wounds do appear to have some need for sharp debridement today and I Ernie Hew go ahead and proceed with that. I think that if we get the wounds cleaned up he will actually show signs of continued improvement. I am also leaning towards switching to [facility] which I think will be a much better option for him. 09-22-2021 patient's wounds are showing signs of excellent improvement. I am actually extremely pleased with where we stand and I think that the patient is making great progress. There does not appear to be any signs of active infection. 09-29-2021 upon evaluation today patient appears to be doing excellent in regard to his wounds. He is actually tolerating the dressing changes without complication. Fortunately I see no evidence of active infection locally or systemically at this time which is great news and overall I am extremely pleased with where we stand currently. 04-979 upon evaluation today patient actually appears to be doing excellent in regard to his wounds. The left leg is almost completely healed the right ankle is significantly smaller. Overall I am extremely pleased with where we stand at this point. I do not see any evidence of active infection at this time. 10-13-2021  upon evaluation today patient appears to be doing excellent in regard to his wounds. I really feel like he is making good progress here and I am very pleased in that regard. Fortunately I do not see any signs of active infection at this time. We are using the [facility] topical antibiotic therapy. 10-20-2021 upon evaluation today patient appears to be doing well with regard to his wound on the right medial ankle region the left leg is almost completely healed. I am actually very pleased with where we stand today. 11-03-2021 upon evaluation  today patient's wound actually is going require some sharp debridement but appears to be doing much better which is great news. Fortunately I do not see any signs of active infection at this time. 11-10-2021 upon evaluation today patient's wound is actually showing signs of excellent improvement. Fortunately I do not see any evidence of infection locally or systemically which is great news and overall I am extremely pleased with where we stand today. I do believe he is making good progress he does have his [facility] topical antibiotics with him here today. 11-24-2021 upon evaluation today patient actually showing signs of excellent improvement this appears to be doing much better. Fortunately I do not see any evidence of infection locally or systemically at this time. 12-01-2021 upon evaluation today patient appears to be doing well currently in regard to his wound. He has been tolerating the dressing changes without complication. Fortunately I do not see any evidence of active infection at this time which is great news and overall I am extremely pleased with where we stand today. 12-15-2021 upon evaluation today patient appears to be doing well with regard to his wound. He is showing signs of improvement is slow but nonetheless we are making improvements here. Fortunately I do not see any evidence of active infection locally nor systemically at this time. We are still  using the [facility] topical antibiotic over the open area only. 12-29-2021 upon evaluation today patient's wound actually is showing signs of excellent improvement. It has been 2 weeks since I perform any debridement and it definitely shows he has some tissue that needs to be cleaned away but I think we can do so quite easily and readily today. The good news is I do not see any signs of infection I think he is doing much better in that regard. Overall I am extremely pleased with where we stand. 01-12-2022 upon evaluation today patient appears to be doing well currently in regard to his wound although it is not getting significantly smaller it is also not getting any larger. Fortunately I do not see any evidence of infection locally nor systemically which is great news and overall I am extremely pleased with where things stand currently. 01-19-2022 upon evaluation today patient appears to be doing well currently in regard to his wound. The PolyMem actually seems to have done extremely well JACEION, HUESCA ([id number]) [id number] Page 4 of 13 for him. Fortunately I do not see any signs of infection locally nor systemically at this time. 01-26-2022 upon evaluation today patient appears to be making progress. Fortunately there does not appear to be any signs of infection which is great news. No fevers, chills, nausea, vomiting, or diarrhea. With that being said this is very slow to heal and although it is smaller I still feel like we may want to try to do something to speed this up I think that a skin substitute could be beneficial, look into Kerecis. 02-02-2022 upon evaluation today patient's wound is actually showing signs of excellent improvement. I do not see any evidence of infection and overall I think that we are headed in the right direction. Fortunately I think that he is tolerating the dressing changes without complication. 02-09-2022 upon evaluation today patient appears to  be doing well currently in regard to his wound. It does look a little bit macerated but fortunately does not appear to be showing any signs of significant skin breakdown over the macerated area which is good news. Fortunately I do not see any evidence  of active infection locally nor systemically at this point which is great news. 02-23-2022 upon evaluation today patient appears to be doing well currently in regard to his wounds. In fact his area on the ankle is actually showing signs of healing quite nicely. We do have the Apligraf ready today I am hopeful this is going to speed things up and get this closed much more effectively and quickly. Fortunately I do not see any signs of active infection locally nor systemically at this time which is great news. 03-02-2022 upon evaluation today patient appears to be doing excellent in regard to his wound. He is actually been tolerating the dressing changes without complication the wound on the left medial lower extremity is doing quite well and Apligraf seems to have been extremely beneficial for him. I am extremely pleased with where we stand today. 03/16/2022: The wound measurements are smaller today. He has accumulation of a yellow crust around the edges and slough on the surface. There is a musty odor coming from the wound. He has been getting Apligraf. 03-30-2022 upon evaluation today patient appears to be making progress. In regard to his leg ulcer. This is slow but nonetheless the Apligraf has fed things up. Fortunately I do not see any evidence of infection locally nor systemically at this time. No fevers, chills, nausea, vomiting, or diarrhea. Patient is here for Apligraf #4 today. 04-13-2022 upon evaluation today patient appears to be doing a little bit more poorly in regard to his wound. He actually feels like his wrap may have been a little bit tight. Fortunately there does not appear to be any signs of active infection locally nor systemically which is  great news and I am pleased in that regard. 04-20-2022 upon evaluation today patient appears to be doing better in regard to his wound from the standpoint of swelling he is actually lost 33 pounds on torsemide in the past week his leg is significantly smaller compared to what it has been. Fortunately I do not see any signs of active infection locally nor systemically at this time. 04-27-2022 upon evaluation today patient's wound actually is showing signs of erythema and warmth around the edges of the wound I am actually concerned about the possibility of infection. I think we probably need to obtain a wound culture and also can recommend based on what we are seeing that we go ahead and have the patient continue to utilize the compression wrapping which I think has been of benefit. 05-04-2022 upon evaluation today patient appears to be doing well currently in regard to his wound. He has been tolerating the dressing changes without complication. Fortunately there does not appear to be any signs of active infection locally or systemically which is great news. 05-11-2022 upon evaluation today patient actually appears to be doing excellent. He has been tolerating the dressing changes we actually were trialing a new medication on him last week and this was the ergo clean Ag dressing. With that being said this actually seems to have done extremely well at this point. Fortunately I do not see any signs of active infection locally or systemically at this time. 05-18-2022 upon evaluation today patient appears to be doing okay in regard to his wound I do not see any signs of worsening also notes any signs of significant improvement. Fortunately I do believe that removing in the right direction in general. 05-25-22 upon evaluation today patient appears to be doing well currently in regard to his wound. He has been tolerating  the dressing changes without complication. Fortunately I do not see any signs of active infection  locally nor systemically which is great news and overall I am extremely pleased with where we stand currently. There does not appear to be any signs of active infection locally nor systemically which is great news. No fevers, chills, nausea, vomiting, or diarrhea. 06-01-2022 upon evaluation today patient actually appears to be making excellent progress in regard to his ankle region. I am very pleased with where we stand I think that he is doing extremely well and very pleased with endoform and drawtex combination. 06-08-2022 upon evaluation today patient appears to be doing well currently in regard to his wound. He has been making good progress and I feel like practicing improvement in the overall size of the wound. I am very pleased in that regard. Fortunately I do not see any signs of active infection locally nor systemically which is great news. No fevers, chills, nausea, vomiting, or diarrhea. 06-15-2022 upon evaluation today patient appears to be doing well currently in regard to his wound. He has been tolerating the dressing changes without complication. I do believe the endoform has been extremely helpful for him up to this point. I do not see any signs of active infection locally nor systemically which is great news and in general I do believe that we are making pretty good progress here which is excellent news. 06-22-2022 upon evaluation today patient appears to be doing well currently in regard to his wound. He has been tolerating the dressing changes without complication. Fortunately there does not appear to be any signs of active infection at this time which is great news. I think that he is actually taking good progress with the endoform which is excellent as well. 06-29-2022 upon evaluation patient appears to be doing well currently in regard to his wound. He is actually making good progress towards closure and I am very pleased in that regard. I do not see any evidence of active infection  locally nor systemically which is great news and in general I do believe that we are making excellent progress here overall. 07-06-2022 upon evaluation today patient appears to be doing well currently in regard to his wound. He has been tolerating the dressing changes without complication. The good news is he is slowly getting smaller each time I see him. 07-13-2022 upon evaluation today patient appears to be doing well currently in regard to his wound is slowly showing signs of improvement. Fortunately I do not see any signs of infection at this time. No fevers, chills, nausea, vomiting, or diarrhea. 07-20-2022 upon evaluation today patient appears to be doing well currently in regard to his wound. He has been tolerating the dressing changes without complication. Fortunately I do not see any evidence of active infection locally or systemically which is great news and in general I do believe that we are making headway towards complete closure. This is excellent news. With that being said we are preparing for him to be leaving to go out of town in about a week and a half. For that reason we will going to make a few changes to his dressing today in order to give him something that I think will be easier for him to do when he is out of town. 07-27-2022 upon evaluation today patient appears to be doing well currently in regard to his wound. He has been tolerating the dressing changes without complication. Fortunately there does not appear to be any signs of  infection. Fortunately there does not appear to be any signs of infection 08-10-2022 upon evaluation today patient appears to be doing okay in regard to his wound. He actually has been gone for 2 weeks and in the interim unfortunately he did not have result out of the Tegaderm as well as hoping for but nonetheless took good care of his wound. Does not appear to be infected I do think we need to go back to the compression wrap however which I think would  definitely benefit him. Fortunately I do not see any signs of active infection at this time which is great news. SHERIFF, AMESQUITA ([id number]) [id number] Page 5 of 13 08-17-2022 upon evaluation today patient unfortunately appears to be showing signs of infection based on what I see at this time. Fortunately I do not see any evidence of systemic infection though locally I do definitely see some changes that are concerning. I do not think that this is systemic at all. 08-24-2022 upon evaluation today patient appears to be doing well currently in regard to his wound. He has been tolerating the dressing changes this looks significantly improved compared to last week and very pleased. I do not see any signs of active infection locally or systemically at this time. 08-31-2022 upon evaluation today patient appears to be doing well currently in regard to his wound this is actually showing signs of improvement and actually very pleased with where we stand today. Fortunately I do not see any signs of active infection which is great news. 09-14-2022 upon evaluation today patient appears to be doing well current regard to his wound with section showing signs of improvement would be using endoform which I think is really doing a great job here. Fortunately I do not see any signs of worsening overall. Electronic Signature(s) Signed: 09/14/2022 5:48:25 PM By: Allen Derry PA-C Entered By: Allen Derry on 09/14/2022 14:48:24 -------------------------------------------------------------------------------- Physical Exam Details Patient Name: Date of Service: Overall, [name] 09/14/2022 3:45 PM Medical Record Number: [id number] Patient Account Number: [id number] Date of Birth/Sex: Treating RN: August 29, 1955 (67 y.o. M) Primary Care Provider: [name] Other Clinician: Referring Provider: Treating Provider/Extender: Alver Fisher in Treatment: 53 Constitutional Well-nourished  and well-hydrated in no acute distress. Respiratory normal breathing without difficulty. Psychiatric this patient is able to make decisions and demonstrates good insight into disease process. Alert and Oriented x 3. pleasant and cooperative. Notes Upon inspection patient's wound bed showed signs of good granulation and epithelization at this point. Fortunately I do not see any evidence of worsening I do believe that the patient is making good headway towards closure. I believe that he is tolerating the dressing changes without complication. Electronic Signature(s) Signed: 09/14/2022 5:48:48 PM By: Allen Derry PA-C Entered By: Allen Derry on 09/14/2022 14:48:48 -------------------------------------------------------------------------------- Physician Orders Details Patient Name: Date of Service: Marinello, [name] 09/14/2022 3:45 PM Medical Record Number: [id number] Patient Account Number: [id number] Date of Birth/Sex: Treating RN: Feb 18, 1955 (67 y.o. [name] Primary Care Provider: [name] Other Clinician: Referring Provider: Treating Provider/Extender: Alver Fisher in Treatment: 61 Verbal / Phone Orders: No Diagnosis Coding ICD-10 Coding Code Description I89.0 Lymphedema, not elsewhere classified I87.333 Chronic venous hypertension (idiopathic) with ulcer and inflammation of bilateral lower extremity [place], Dannielle Huh [name] ([id number]) [id number] Page 6 of 13 [phone] Non-pressure chronic ulcer of other part of left lower leg with other specified severity L97.818 Non-pressure chronic ulcer of other part of right lower leg with other  specified severity E11.621 Type 2 diabetes mellitus with foot ulcer L97.518 Non-pressure chronic ulcer of other part of right foot with other specified severity Follow-up Appointments ppointment in 1 week. Leonard Schwartz Wednesday 09/21/2022 (already scheduled) Return A ppointment in 2 weeks. Leonard Schwartz Wednesday  09/28/2022 (already scheduled) Return A Return appointment in 3 weeks. Leonard Schwartz Wednesday 10/05/2022 (already scheduled) Anesthetic (In clinic) Topical Lidocaine 5% applied to wound bed - Used in Clinic Prior to debridement (In clinic) Topical Lidocaine 4% applied to wound bed Cellular or Tissue Based Products Cellular or Tissue Based Product Type: - Run IVR for Kerrecis- denied #1 Apligraf applied 02/23/2022 #2 Apligraf applied 03/02/2022 #3 Apligraf applied 03/16/2022 #4 Apligraf applied 03/30/2022 HOLD APLIGRAF this week. Cellular or Tissue Based Product applied to wound bed, secured with steri-strips, cover with Adaptic or Mepitel. (DO NOT REMOVE). Bathing/ Shower/ Hygiene May shower with protection but do not get wound dressing(s) wet. Protect dressing(s) with water repellant cover (for example, large plastic bag) or a cast cover and may then take shower. Edema Control - Lymphedema / SCD / Other Elevate legs to the level of the heart or above for 30 minutes daily and/or when sitting for 3-4 times a day throughout the day. Avoid standing for long periods of time. Patient to wear own compression stockings every day. - apply in the morning and remove at night left leg. Exercise regularly Moisturize legs daily. - apply every night before bed. Compression stocking or Garment 30-40 mm/Hg pressure to: - wear your VIVE compression stocking to left leg. apply in the morning and remove at night. Wound Treatment Wound #7 - Malleolus Wound Laterality: Right, Medial Cleanser: Soap and Water 1 x Per Week/30 Days Discharge Instructions: May shower and wash wound with dial antibacterial soap and water prior to dressing change. Cleanser: Vashe 5.8 (oz) 1 x Per Week/30 Days Discharge Instructions: Cleanse the wound with Vashe prior to applying a clean dressing using gauze sponges, not tissue or cotton balls. Peri-Wound Care: Sween Lotion (Moisturizing lotion) 1 x Per Week/30 Days Discharge Instructions:  Apply moisturizing lotion as directed Topical: Gentamicin 1 x Per Week/30 Days Discharge Instructions: As directed by physician Prim Dressing: Endoform 2x2 in (Dispense As Written) 1 x Per Week/30 Days ary Discharge Instructions: Moisten with saline Secondary Dressing: CarboFLEX Odor Control Dressing, 4x4 in 1 x Per Week/30 Days Discharge Instructions: Apply over primary dressing as directed. Secondary Dressing: Drawtex 4x4 in 1 x Per Week/30 Days Discharge Instructions: Apply over primary dressing as directed. Secondary Dressing: Zetuvit Plus 4x8 in 1 x Per Week/30 Days Discharge Instructions: Apply over primary dressing as directed. Compression Wrap: Urgo K2, (equivalent to a 4 layer) two layer compression system, regular 1 x Per Week/30 Days Discharge Instructions: Apply Urgo K2 as directed (alternative to 4 layer compression). Electronic Signature(s) Signed: 09/14/2022 6:09:26 PM By: Shawn Stall RN, BSN Signed: 09/14/2022 6:11:34 PM By: Allen Derry PA-C Entered By: Shawn Stall on 09/14/2022 13:42:23 [place], Hung [name] ([id number]) [id number] Page 7 of 13 -------------------------------------------------------------------------------- Problem List Details Patient Name: Date of Service: [name], [name] 09/14/2022 3:45 PM Medical Record Number: [id number] Patient Account Number: [id number] Date of Birth/Sex: Treating RN: Sep 04, 1955 (67 y.o. M) Primary Care Provider: [name] Other Clinician: Referring Provider: Treating Provider/Extender: Alver Fisher in Treatment: 58 Active Problems ICD-10 Encounter Code Description Active Date MDM Diagnosis I89.0 Lymphedema, not elsewhere classified 08/04/2021 No Yes I87.333 Chronic venous hypertension (idiopathic) with ulcer and inflammation of 08/04/2021 No Yes bilateral  lower extremity L97.828 Non-pressure chronic ulcer of other part of left lower leg with other specified 08/04/2021 No  Yes severity L97.818 Non-pressure chronic ulcer of other part of right lower leg with other specified 08/04/2021 No Yes severity E11.621 Type 2 diabetes mellitus with foot ulcer 08/04/2021 No Yes L97.518 Non-pressure chronic ulcer of other part of right foot with other specified 08/04/2021 No Yes severity Inactive Problems Resolved Problems Electronic Signature(s) Signed: 09/14/2022 3:48:37 PM By: Allen Derry PA-C Entered By: Allen Derry on 09/14/2022 12:48:37 -------------------------------------------------------------------------------- Progress Note Details Patient Name: Date of Service: [name], [name] 09/14/2022 3:45 PM Medical Record Number: [id number] Patient Account Number: [id number] Date of Birth/Sex: Treating RN: 06/01/1955 (67 y.o. M) Primary Care Provider: [name] Other Clinician: Referring Provider: Treating Provider/Extender: Alver Fisher in Treatment: [address], Dannielle Huh [name] ([id number]) [id number] Page 8 of 13 Chief Complaint Information obtained from Patient 08/04/2021; patient returns to clinic with bilateral leg wounds as well as areas on the right foot History of Present Illness (HPI) ADMISSION 03/22/2021 This is a 67 year old man with a past medical history significant for diabetes type 2, congestive heart failure, peripheral arterial disease, morbid obesity, venous insufficiency, and coronary artery disease. He has been followed by Dr. Loreta Ave in podiatry, who performed a transmetatarsal amputation on the left foot in August 2022. He had issues healing that wound, but based upon Dr. Kenna Gilbert notes, ultimately the TMA wound healed. During his recovery from that surgery, however, ulcers opened up over the DIP joint of the right second and third toe. These have apparently closed and reopened multiple times. It sounds like one of the issues has been moisture accumulation and maceration of the tissues causing them  to reopen. At his last visit with Dr. Loreta Ave, on March 01, 2021, there continues to be problems with moisture and he was referred to wound care for further evaluation and management. He had a formal aortogram with runoff performed prior to his TMA. The findings are copied here: Patient has inline flow to both feet with no significant flow-limiting lesion that would be amenable to percutaneous or open revascularization. He does have an element of small vessel disease and has a short segment occlusion of the distal anterior tibial/dorsalis pedis artery on the left foot but does have posterior tibial artery flow. Would recommend management of wounds with amputation of toes 2 and 3 on the right foot if the wounds do not heal and deteriorate. Transmetatarsal amputation on the left side has as good a blood supply as it is going to get and hopefully this will heal in the future. Formal ABIs were done in January 2023. They are normal bilaterally. ABI Findings: +---------+------------------+-----+----------+--------+ Right Rt Pressure (mmHg)IndexWaveform Comment  +---------+------------------+-----+----------+--------+ Brachial 160    +---------+------------------+-----+----------+--------+ PTA 192 1.06 monophasic  +---------+------------------+-----+----------+--------+ DP 159 0.88 monophasic  +---------+------------------+-----+----------+--------+ Great T oe145 0.80    +---------+------------------+-----+----------+--------+ +--------+------------------+-----+---------+-------+ Left Lt Pressure (mmHg)IndexWaveform Comment +--------+------------------+-----+---------+-------+ [id number]    +--------+------------------+-----+---------+-------+ PTA 204 1.13 triphasic  +--------+------------------+-----+---------+-------+ DP 194 1.07 biphasic    +--------+------------------+-----+---------+-------+ +-------+-----------+-----------+------------+------------+ ABI/TBIT oday's ABIT oday's TBIPrevious ABIPrevious TBI +-------+-----------+-----------+------------+------------+ Right 1.06 0.8 1.26 0.65  +-------+-----------+-----------+------------+------------+ Left 1.13 amputation 1.15 amputation  +-------+-----------+-----------+------------+------------+ Previous ABI on 08/06/20 at [facility] Pedal pressures falsely elevated due to medial calcification. Summary: Right: Resting right ankle-brachial index is within normal range. The right toe-brachial index is normal. Left: Resting left ankle-brachial index is within normal range. READMISSION 08/04/2021 Mr. Mayall is now a 68 year old man who I remember from this clinic  many years ago I think he had a right lower extremity predominantly venous wound at the time. He was here for 1 visit in March of this year had wounds on his right second and third toes we apparently dressed them many and they healed so he did not come back. He is listed in [place] is being a diabetic although the patient denies this says he is verified it with his primary doctor. In any case over the last several weeks or so according the patient although these wounds look somewhat more chronic than that he has developed predominantly large wounds on the right medial and right lateral ankle smaller areas on the left leg and areas on the dorsal aspect of his right second and third toes. Its not clear how he has been dressing these. 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 The patient has known PAD. He had an angiogram in August 2022 at which time he had nonhealing wounds in both feet. On the left his major vessels in the thigh were all patent. He had three-vessel patent to the level of the ankle. He had a very short  occlusion in the left anterior tibial. On the right lower extremity the major vessels in his thigh were all patent. He had three-vessel runoff to the foot sluggish filling of the anterior tibial artery. He was felt to have some component of small vessel disease but nothing that was amenable or needed revascularization. It was recommended that he have amputation of the second and third toes on the right foot if they did not heal He has been following with Dr. Loreta Ave of podiatry. Dr. Loreta Ave got him juxta lite stockings although I do not think he had them on properly he has uncontrolled edema in both legs Past medical history includes type 2 diabetes [although the patient really denies this], left TMA in 2022,lower extremity wounds in fact attendance at this clinic in 2009-2010, A-fib on Eliquis, chronic venous insufficiency with secondary lymphedema history of non-Hodgkin's lymphoma. 08-11-2021 upon evaluation today patient presents for follow-up evaluation he was seen last Wednesday initially for inspection here in our clinic. With that being [name], [name] ([id number]) [id number] Page 9 of 13 said he tells me that he unfortunately has been having a lot of drainage and is actually coming through his wrap. Fortunately I do not see any evidence of active infection locally or systemically at this time which is great news. No fevers, chills, nausea, vomiting, or diarrhea. With that being said there does appear to be some evidence of local infection based on what I am seeing today. 08-18-2021 upon evaluation today patient appears to be doing okay currently in regard to his wounds with that being said that he is doing much better but still has a long ways to go to get to where he wanted to be. I think the infection is significantly improved. He has another week of the antibiotic at this point. 08-25-2021 upon evaluation today patient's wounds are actually doing decently well he has  erythema has significantly improved. I think the cellulitis is under controlled I am going to continue him on 2 more weeks of the Levaquin at 500 mg this is a lower dose but I am hoping it will be better for him. 09-08-2021 upon evaluation today patient appears to be doing excellent in regard to his wounds. Since I last saw him he was actually in the  hospital where he ended up having a pacemaker put in. Subsequently he tells me that he is actually doing quite well although they were unsure whether they were going to do it due to the fact that he had the wounds on his legs. And then I am glad they did anything seem to be doing well. 09-15-2021 upon evaluation patient's wounds are actually showing signs of improvement. The right side wounds do appear to have some need for sharp debridement today and I Ernie Hew go ahead and proceed with that. I think that if we get the wounds cleaned up he will actually show signs of continued improvement. I am also leaning towards switching to [facility] which I think will be a much better option for him. 09-22-2021 patient's wounds are showing signs of excellent improvement. I am actually extremely pleased with where we stand and I think that the patient is making great progress. There does not appear to be any signs of active infection. 09-29-2021 upon evaluation today patient appears to be doing excellent in regard to his wounds. He is actually tolerating the dressing changes without complication. Fortunately I see no evidence of active infection locally or systemically at this time which is great news and overall I am extremely pleased with where we stand currently. [id number] upon evaluation today patient actually appears to be doing excellent in regard to his wounds. The left leg is almost completely healed the right ankle is significantly smaller. Overall I am extremely pleased with where we stand at this point. I do not see any evidence of active infection at this  time. 10-13-2021 upon evaluation today patient appears to be doing excellent in regard to his wounds. I really feel like he is making good progress here and I am very pleased in that regard. Fortunately I do not see any signs of active infection at this time. We are using the [facility] topical antibiotic therapy. 10-20-2021 upon evaluation today patient appears to be doing well with regard to his wound on the right medial ankle region the left leg is almost completely healed. I am actually very pleased with where we stand today. 11-03-2021 upon evaluation today patient's wound actually is going require some sharp debridement but appears to be doing much better which is great news. Fortunately I do not see any signs of active infection at this time. 11-10-2021 upon evaluation today patient's wound is actually showing signs of excellent improvement. Fortunately I do not see any evidence of infection locally or systemically which is great news and overall I am extremely pleased with where we stand today. I do believe he is making good progress he does have his [facility] topical antibiotics with him here today. 11-24-2021 upon evaluation today patient actually showing signs of excellent improvement this appears to be doing much better. Fortunately I do not see any evidence of infection locally or systemically at this time. 12-01-2021 upon evaluation today patient appears to be doing well currently in regard to his wound. He has been tolerating the dressing changes without complication. Fortunately I do not see any evidence of active infection at this time which is great news and overall I am extremely pleased with where we stand today. 12-15-2021 upon evaluation today patient appears to be doing well with regard to his wound. He is showing signs of improvement is slow but nonetheless we are making improvements here. Fortunately I do not see any evidence of active infection locally nor systemically at this  time. We are still using  the [facility] topical antibiotic over the open area only. 12-29-2021 upon evaluation today patient's wound actually is showing signs of excellent improvement. It has been 2 weeks since I perform any debridement and it definitely shows he has some tissue that needs to be cleaned away but I think we can do so quite easily and readily today. The good news is I do not see any signs of infection I think he is doing much better in that regard. Overall I am extremely pleased with where we stand. 01-12-2022 upon evaluation today patient appears to be doing well currently in regard to his wound although it is not getting significantly smaller it is also not getting any larger. Fortunately I do not see any evidence of infection locally nor systemically which is great news and overall I am extremely pleased with where things stand currently. 01-19-2022 upon evaluation today patient appears to be doing well currently in regard to his wound. The PolyMem actually seems to have done extremely well for him. Fortunately I do not see any signs of infection locally nor systemically at this time. 01-26-2022 upon evaluation today patient appears to be making progress. Fortunately there does not appear to be any signs of infection which is great news. No fevers, chills, nausea, vomiting, or diarrhea. With that being said this is very slow to heal and although it is smaller I still feel like we may want to try to do something to speed this up I think that a skin substitute could be beneficial, look into Kerecis. 02-02-2022 upon evaluation today patient's wound is actually showing signs of excellent improvement. I do not see any evidence of infection and overall I think that we are headed in the right direction. Fortunately I think that he is tolerating the dressing changes without complication. 02-09-2022 upon evaluation today patient appears to be doing well currently in regard to his wound. It does look  a little bit macerated but fortunately does not appear to be showing any signs of significant skin breakdown over the macerated area which is good news. Fortunately I do not see any evidence of active infection locally nor systemically at this point which is great news. 02-23-2022 upon evaluation today patient appears to be doing well currently in regard to his wounds. In fact his area on the ankle is actually showing signs of healing quite nicely. We do have the Apligraf ready today I am hopeful this is going to speed things up and get this closed much more effectively and quickly. Fortunately I do not see any signs of active infection locally nor systemically at this time which is great news. 03-02-2022 upon evaluation today patient appears to be doing excellent in regard to his wound. He is actually been tolerating the dressing changes without complication the wound on the left medial lower extremity is doing quite well and Apligraf seems to have been extremely beneficial for him. I am extremely pleased with where we stand today. 03/16/2022: The wound measurements are smaller today. He has accumulation of a yellow crust around the edges and slough on the surface. There is a musty odor coming from the wound. He has been getting Apligraf. 03-30-2022 upon evaluation today patient appears to be making progress. In regard to his leg ulcer. This is slow but nonetheless the Apligraf has fed things up. Fortunately I do not see any evidence of infection locally nor systemically at this time. No fevers, chills, nausea, vomiting, or diarrhea. Patient is here for Apligraf #4 today. 04-13-2022  upon evaluation today patient appears to be doing a little bit more poorly in regard to his wound. He actually feels like his wrap may have been a little bit tight. Fortunately there does not appear to be any signs of active infection locally nor systemically which is great news and I am pleased in that regard. URHO, MANDEL  ([id number]) [id number] Page 10 of 13 04-20-2022 upon evaluation today patient appears to be doing better in regard to his wound from the standpoint of swelling he is actually lost 33 pounds on torsemide in the past week his leg is significantly smaller compared to what it has been. Fortunately I do not see any signs of active infection locally nor systemically at this time. 04-27-2022 upon evaluation today patient's wound actually is showing signs of erythema and warmth around the edges of the wound I am actually concerned about the possibility of infection. I think we probably need to obtain a wound culture and also can recommend based on what we are seeing that we go ahead and have the patient continue to utilize the compression wrapping which I think has been of benefit. 05-04-2022 upon evaluation today patient appears to be doing well currently in regard to his wound. He has been tolerating the dressing changes without complication. Fortunately there does not appear to be any signs of active infection locally or systemically which is great news. 05-11-2022 upon evaluation today patient actually appears to be doing excellent. He has been tolerating the dressing changes we actually were trialing a new medication on him last week and this was the ergo clean Ag dressing. With that being said this actually seems to have done extremely well at this point. Fortunately I do not see any signs of active infection locally or systemically at this time. 05-18-2022 upon evaluation today patient appears to be doing okay in regard to his wound I do not see any signs of worsening also notes any signs of significant improvement. Fortunately I do believe that removing in the right direction in general. 05-25-22 upon evaluation today patient appears to be doing well currently in regard to his wound. He has been tolerating the dressing changes without complication. Fortunately I do not see any  signs of active infection locally nor systemically which is great news and overall I am extremely pleased with where we stand currently. There does not appear to be any signs of active infection locally nor systemically which is great news. No fevers, chills, nausea, vomiting, or diarrhea. 06-01-2022 upon evaluation today patient actually appears to be making excellent progress in regard to his ankle region. I am very pleased with where we stand I think that he is doing extremely well and very pleased with endoform and drawtex combination. 06-08-2022 upon evaluation today patient appears to be doing well currently in regard to his wound. He has been making good progress and I feel like practicing improvement in the overall size of the wound. I am very pleased in that regard. Fortunately I do not see any signs of active infection locally nor systemically which is great news. No fevers, chills, nausea, vomiting, or diarrhea. 06-15-2022 upon evaluation today patient appears to be doing well currently in regard to his wound. He has been tolerating the dressing changes without complication. I do believe the endoform has been extremely helpful for him up to this point. I do not see any signs of active infection locally nor systemically which is great news and in general I do believe that  we are making pretty good progress here which is excellent news. 06-22-2022 upon evaluation today patient appears to be doing well currently in regard to his wound. He has been tolerating the dressing changes without complication. Fortunately there does not appear to be any signs of active infection at this time which is great news. I think that he is actually taking good progress with the endoform which is excellent as well. 06-29-2022 upon evaluation patient appears to be doing well currently in regard to his wound. He is actually making good progress towards closure and I am very pleased in that regard. I do not see any  evidence of active infection locally nor systemically which is great news and in general I do believe that we are making excellent progress here overall. 07-06-2022 upon evaluation today patient appears to be doing well currently in regard to his wound. He has been tolerating the dressing changes without complication. The good news is he is slowly getting smaller each time I see him. 07-13-2022 upon evaluation today patient appears to be doing well currently in regard to his wound is slowly showing signs of improvement. Fortunately I do not see any signs of infection at this time. No fevers, chills, nausea, vomiting, or diarrhea. 07-20-2022 upon evaluation today patient appears to be doing well currently in regard to his wound. He has been tolerating the dressing changes without complication. Fortunately I do not see any evidence of active infection locally or systemically which is great news and in general I do believe that we are making headway towards complete closure. This is excellent news. With that being said we are preparing for him to be leaving to go out of town in about a week and a half. For that reason we will going to make a few changes to his dressing today in order to give him something that I think will be easier for him to do when he is out of town. 07-27-2022 upon evaluation today patient appears to be doing well currently in regard to his wound. He has been tolerating the dressing changes without complication. Fortunately there does not appear to be any signs of infection. Fortunately there does not appear to be any signs of infection 08-10-2022 upon evaluation today patient appears to be doing okay in regard to his wound. He actually has been gone for 2 weeks and in the interim unfortunately he did not have result out of the Tegaderm as well as hoping for but nonetheless took good care of his wound. Does not appear to be infected I do think we need to go back to the compression wrap  however which I think would definitely benefit him. Fortunately I do not see any signs of active infection at this time which is great news. 08-17-2022 upon evaluation today patient unfortunately appears to be showing signs of infection based on what I see at this time. Fortunately I do not see any evidence of systemic infection though locally I do definitely see some changes that are concerning. I do not think that this is systemic at all. 08-24-2022 upon evaluation today patient appears to be doing well currently in regard to his wound. He has been tolerating the dressing changes this looks significantly improved compared to last week and very pleased. I do not see any signs of active infection locally or systemically at this time. 08-31-2022 upon evaluation today patient appears to be doing well currently in regard to his wound this is actually showing signs of improvement  and actually very pleased with where we stand today. Fortunately I do not see any signs of active infection which is great news. 09-14-2022 upon evaluation today patient appears to be doing well current regard to his wound with section showing signs of improvement would be using endoform which I think is really doing a great job here. Fortunately I do not see any signs of worsening overall. Objective Constitutional Well-nourished and well-hydrated in no acute distress. Vitals Time Taken: 4:10 PM, Height: 71 in, Weight: 350 lbs, BMI: 48.8, Temperature: 98.1 F, Pulse: 70 bpm, Respiratory Rate: 18 breaths/min, Blood Pressure: 114/76 mmHg. [name], [name] ([id number]) [id number] Page 11 of 13 Respiratory normal breathing without difficulty. Psychiatric this patient is able to make decisions and demonstrates good insight into disease process. Alert and Oriented x 3. pleasant and cooperative. General Notes: Upon inspection patient's wound bed showed signs of good granulation and epithelization at this point.  Fortunately I do not see any evidence of worsening I do believe that the patient is making good headway towards closure. I believe that he is tolerating the dressing changes without complication. Integumentary (Hair, Skin) Wound #7 status is Open. Original cause of wound was Blister. The date acquired was: 07/21/2021. The wound has been in treatment 58 weeks. The wound is located on the Right,Medial Malleolus. The wound measures 3.4cm length x 1.8cm width x 0.3cm depth; 4.807cm^2 area and 1.442cm^3 volume. There is Fat Layer (Subcutaneous Tissue) exposed. There is no tunneling or undermining noted. There is a medium amount of serosanguineous drainage noted. There is large (67-100%) red granulation within the wound bed. There is a small (1-33%) amount of necrotic tissue within the wound bed. The periwound skin appearance exhibited: Dry/Scaly. Periwound temperature was noted as No Abnormality. 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 There was a Excisional Skin/Subcutaneous Tissue Debridement with a total area of 4.8 sq cm performed by Lenda Kelp, PA. With the following instrument(s): Curette to remove Viable and Non-Viable tissue/material. Material removed includes Subcutaneous Tissue, Slough, Skin: Dermis, and Skin: Epidermis after achieving pain control using Lidocaine 4% Topical Solution. A  time out was conducted at 16:30, prior to the start of the procedure. A Minimum amount of bleeding was controlled with Pressure. The procedure was tolerated well with a pain level of 0 throughout and a pain level of 0 following the procedure. Post Debridement Measurements: 3.4cm length x 1.8cm width x 0.3cm depth; 1.442cm^3 volume. Character of Wound/Ulcer Post Debridement is improved. Severity of Tissue Post Debridement is: Fat layer exposed. Post procedure Diagnosis Wound #7: Same as Pre-Procedure Pre-procedure diagnosis of Wound #7 is a Venous Leg Ulcer located on the Right,Medial Malleolus . There was a Double Layer Compression Therapy Procedure by Shawn Stall, RN. Post procedure Diagnosis Wound #7: Same as Pre-Procedure Plan Follow-up Appointments: Return Appointment in 1 week. Leonard Schwartz Wednesday 09/21/2022 (already scheduled) Return Appointment in 2 weeks. Leonard Schwartz Wednesday 09/28/2022 (already scheduled) Return appointment in 3 weeks. Leonard Schwartz Wednesday 10/05/2022 (  already scheduled) Anesthetic: (In clinic) Topical Lidocaine 5% applied to wound bed - Used in Clinic Prior to debridement (In clinic) Topical Lidocaine 4% applied to wound bed Cellular or Tissue Based Products: Cellular or Tissue Based Product Type: - Run IVR for Kerrecis- denied #1 Apligraf applied 02/23/2022 #2 Apligraf applied 03/02/2022 #3 Apligraf applied 03/16/2022 #4 Apligraf applied 03/30/2022 HOLD APLIGRAF this week. Cellular or Tissue Based Product applied to wound bed, secured with steri-strips, cover with Adaptic or Mepitel. (DO NOT REMOVE). Bathing/ Shower/ Hygiene: May shower with protection but do not get wound dressing(s) wet. Protect dressing(s) with water repellant cover (for example, large plastic bag) or a cast cover and may then take shower. Edema Control - Lymphedema / SCD / Other: Elevate legs to the level of the heart or above for 30 minutes daily and/or when sitting for 3-4 times a day throughout the  day. Avoid standing for long periods of time. Patient to wear own compression stockings every day. - apply in the morning and remove at night left leg. Exercise regularly Moisturize legs daily. - apply every night before bed. Compression stocking or Garment 30-40 mm/Hg pressure to: - wear your VIVE compression stocking to left leg. apply in the morning and remove at night. WOUND #7: - Malleolus Wound Laterality: Right, Medial Cleanser: Soap and Water 1 x Per Week/30 Days Discharge Instructions: May shower and wash wound with dial antibacterial soap and water prior to dressing change. Cleanser: Vashe 5.8 (oz) 1 x Per Week/30 Days Discharge Instructions: Cleanse the wound with Vashe prior to applying a clean dressing using gauze sponges, not tissue or cotton balls. Peri-Wound Care: Sween Lotion (Moisturizing lotion) 1 x Per Week/30 Days [name], [name] ([id number]) [id number] Page 12 of 13 Discharge Instructions: Apply moisturizing lotion as directed Topical: Gentamicin 1 x Per Week/30 Days Discharge Instructions: As directed by physician Prim Dressing: Endoform 2x2 in (Dispense As Written) 1 x Per Week/30 Days ary Discharge Instructions: Moisten with saline Secondary Dressing: CarboFLEX Odor Control Dressing, 4x4 in 1 x Per Week/30 Days Discharge Instructions: Apply over primary dressing as directed. Secondary Dressing: Drawtex 4x4 in 1 x Per Week/30 Days Discharge Instructions: Apply over primary dressing as directed. Secondary Dressing: Zetuvit Plus 4x8 in 1 x Per Week/30 Days Discharge Instructions: Apply over primary dressing as directed. Com pression Wrap: Urgo K2, (equivalent to a 4 layer) two layer compression system, regular 1 x Per Week/30 Days Discharge Instructions: Apply Urgo K2 as directed (alternative to 4 layer compression). 1. I would recommend that we have the patient continue to monitor for any evidence of infection or worsening. Based on what I  see I do believe that the patient is making excellent headway towards complete closure. 2. Also can recommend that we should continue with the endoform although we will use drawtex over top of this followed by Cortaflex. 3. I am also going to recommend that we continue with Urgo K2 compression wrap which seems to be doing quite well. We will see patient back for reevaluation in 1 week here in the clinic. If anything worsens or changes patient will contact our office for additional recommendations. Electronic Signature(s) Signed: 09/14/2022 5:49:20 PM By: Allen Derry PA-C Entered By: Allen Derry on 09/14/2022 14:49:20 -------------------------------------------------------------------------------- SuperBill Details Patient Name: Date of Service: [name], [name] 09/14/2022 Medical Record Number: [id number] Patient Account Number: [id number] Date of Birth/Sex: Treating RN: Nov 06, 1955 (67 y.o. [name] Primary Care Provider: [name] Other Clinician: Referring Provider: Treating Provider/Extender: Linwood Dibbles,  Manfred Shirts, Chrissie Noa Weeks in Treatment: 58 Diagnosis Coding ICD-10 Codes Code Description I89.0 Lymphedema, not elsewhere classified I87.333 Chronic venous hypertension (idiopathic) with ulcer and inflammation of bilateral lower extremity L97.828 Non-pressure chronic ulcer of other part of left lower leg with other specified severity L97.818 Non-pressure chronic ulcer of other part of right lower leg with other specified severity E11.621 Type 2 diabetes mellitus with foot ulcer L97.518 Non-pressure chronic ulcer of other part of right foot with other specified severity Facility Procedures : 3 CPT4 Code: 0981191 Description: 11042 - DEB SUBQ TISSUE 20 SQ CM/< ICD-10 Diagnosis Description L97.818 Non-pressure chronic ulcer of other part of right lower leg with other specified Modifier: severity Quantity: 1 Physician Procedures : CPT4 Code Description Modifier [id number] 11042 -  WC PHYS SUBQ TISS 20 SQ CM ICD-10 Diagnosis Description L97.818 Non-pressure chronic ulcer of other part of right lower leg with other specified severity Quantity: 1 Electronic Signature(s) Signed: 09/14/2022 5:53:56 PM By: Dava Najjar, Dannielle Huh [name] ([id number]) [id number] Page 13 of 13 Entered By: Allen Derry on 09/14/2022 14:53:56

## 2022-09-19 ENCOUNTER — Ambulatory Visit (INDEPENDENT_AMBULATORY_CARE_PROVIDER_SITE_OTHER): Payer: Medicare Other

## 2022-09-19 DIAGNOSIS — I482 Chronic atrial fibrillation, unspecified: Secondary | ICD-10-CM | POA: Diagnosis not present

## 2022-09-19 LAB — CUP PACEART REMOTE DEVICE CHECK
Battery Remaining Longevity: 81 mo
Battery Voltage: 3.01 V
Brady Statistic AP VP Percent: 99.57 %
Brady Statistic AP VS Percent: 0 %
Brady Statistic AS VP Percent: 0.1 %
Brady Statistic AS VS Percent: 0.34 %
Brady Statistic RA Percent Paced: 98.36 %
Brady Statistic RV Percent Paced: 98.5 %
Date Time Interrogation Session: 20240909022603
HighPow Impedance: 105 Ohm
Implantable Lead Connection Status: 753985
Implantable Lead Connection Status: 753985
Implantable Lead Implant Date: 20230823
Implantable Lead Implant Date: 20230823
Implantable Lead Location: 753859
Implantable Lead Location: 753860
Implantable Lead Model: 3830
Implantable Lead Model: 6935
Implantable Pulse Generator Implant Date: 20230823
Lead Channel Impedance Value: 323 Ohm
Lead Channel Impedance Value: 437 Ohm
Lead Channel Impedance Value: 456 Ohm
Lead Channel Sensing Intrinsic Amplitude: 4 mV
Lead Channel Sensing Intrinsic Amplitude: 4 mV
Lead Channel Sensing Intrinsic Amplitude: 4.5 mV
Lead Channel Sensing Intrinsic Amplitude: 4.5 mV
Lead Channel Setting Pacing Amplitude: 1.5 V
Lead Channel Setting Pacing Amplitude: 2 V
Lead Channel Setting Pacing Pulse Width: 0.4 ms
Lead Channel Setting Sensing Sensitivity: 0.3 mV
Zone Setting Status: 755011

## 2022-09-20 ENCOUNTER — Telehealth: Payer: Self-pay

## 2022-09-20 NOTE — Telephone Encounter (Addendum)
Alert received from [employer] [facility]:  Scheduled remote reviewed. Normal device function.   There were 2 NSVT ararhythmias detected and one VT arrhythmia that was converted after one burst of ATP, sent to triage  Outreach made to Pt.  He denies awareness of episode on September 02, 2022 at 7:30 am.  But he does state that his CPAP was broken for a few weeks and he thinks it was around this time.  He is complaint with all prescribed medications.  Advised per DMV guidelines-no driving for 6 months post appropriate treatment from defibrillator.  Pt indicates understanding.  Advised would send to Dr. Ladona Ridgel for review and call back with any further recommendations.

## 2022-09-21 ENCOUNTER — Encounter (HOSPITAL_BASED_OUTPATIENT_CLINIC_OR_DEPARTMENT_OTHER): Payer: Medicare Other | Admitting: Physician Assistant

## 2022-09-21 DIAGNOSIS — I87333 Chronic venous hypertension (idiopathic) with ulcer and inflammation of bilateral lower extremity: Secondary | ICD-10-CM | POA: Diagnosis not present

## 2022-09-21 DIAGNOSIS — L97518 Non-pressure chronic ulcer of other part of right foot with other specified severity: Secondary | ICD-10-CM | POA: Diagnosis not present

## 2022-09-21 DIAGNOSIS — E11621 Type 2 diabetes mellitus with foot ulcer: Secondary | ICD-10-CM | POA: Diagnosis not present

## 2022-09-21 DIAGNOSIS — I87311 Chronic venous hypertension (idiopathic) with ulcer of right lower extremity: Secondary | ICD-10-CM | POA: Diagnosis not present

## 2022-09-21 DIAGNOSIS — I890 Lymphedema, not elsewhere classified: Secondary | ICD-10-CM | POA: Diagnosis not present

## 2022-09-21 DIAGNOSIS — L97312 Non-pressure chronic ulcer of right ankle with fat layer exposed: Secondary | ICD-10-CM | POA: Diagnosis not present

## 2022-09-21 DIAGNOSIS — L97828 Non-pressure chronic ulcer of other part of left lower leg with other specified severity: Secondary | ICD-10-CM | POA: Diagnosis not present

## 2022-09-21 DIAGNOSIS — Z6841 Body Mass Index (BMI) 40.0 and over, adult: Secondary | ICD-10-CM | POA: Diagnosis not present

## 2022-09-21 DIAGNOSIS — E1151 Type 2 diabetes mellitus with diabetic peripheral angiopathy without gangrene: Secondary | ICD-10-CM | POA: Diagnosis not present

## 2022-09-21 DIAGNOSIS — I509 Heart failure, unspecified: Secondary | ICD-10-CM | POA: Diagnosis not present

## 2022-09-21 DIAGNOSIS — L97818 Non-pressure chronic ulcer of other part of right lower leg with other specified severity: Secondary | ICD-10-CM | POA: Diagnosis not present

## 2022-09-21 DIAGNOSIS — I110 Hypertensive heart disease with heart failure: Secondary | ICD-10-CM | POA: Diagnosis not present

## 2022-09-21 DIAGNOSIS — I2510 Atherosclerotic heart disease of native coronary artery without angina pectoris: Secondary | ICD-10-CM | POA: Diagnosis not present

## 2022-09-21 NOTE — Progress Notes (Signed)
[name] ([id number]) [id number] Page 1 of 6 Visit Report for 09/14/2022 Arrival Information Details Patient Name: Date of Service: [name], [name] 09/14/2022 3:45 PM Medical Record Number: [id number] Patient Account Number: [id number] Date of Birth/Sex: Treating RN: Feb 27, 1955 (67 y.o. M) Primary Care Glenyce Randle: [name] Other Clinician: Referring Keshun Berrett: Treating Dontavia Brand/Extender: Alver Fisher in Treatment: 58 Visit Information History Since Last Visit Added or deleted any medications: No Patient Arrived: [name] Any new allergies or adverse reactions: No Arrival Time: 16:03 Had a fall or experienced change in No Accompanied By: self activities of daily living that may affect Transfer Assistance: None risk of falls: Patient Identification Verified: Yes Signs or symptoms of abuse/neglect since last visito No Secondary Verification Process Completed: Yes Hospitalized since last visit: No Patient Requires Transmission-Based Precautions: No Implantable device outside of the clinic excluding No Patient Has Alerts: Yes cellular tissue based products placed in the center Patient Alerts: Patient on Blood Thinner since last visit: 01/2021 ABI L 1.13 R 1.06 Has Dressing in Place as Prescribed: Yes 01/2021 TBI L amp R 0.8 Has Compression in Place as Prescribed: Yes Pain Present Now: No Electronic Signature(s) Signed: 09/21/2022 4:44:01 PM By: Thayer Dallas Entered By: Thayer Dallas on 09/14/2022 16:04:51 -------------------------------------------------------------------------------- Compression Therapy Details Patient Name: Date of Service: [name] Craven D. 09/14/2022 3:45 PM Medical Record Number: [id number] Patient Account Number: [id number] Date of Birth/Sex: Treating RN: Feb 04, 1955 (67 y.o. [name] Primary Care Jaelan Rasheed: [name] Other Clinician: Referring Tameaka Eichhorn: Treating Kimber Fritts/Extender: Alver Fisher in Treatment: 58 Compression Therapy Performed for Wound Assessment: Wound #7 Right,Medial Malleolus Performed By: Clinician Shawn Stall, RN Compression Type: Double Layer Post Procedure Diagnosis Same as Pre-procedure Electronic Signature(s) Signed: 09/14/2022 6:09:26 PM By: Shawn Stall RN, BSN Entered By: Shawn Stall on 09/14/2022 16:37:41 Santana, Herbie D ([id number]) [id number] Page 2 of 6 -------------------------------------------------------------------------------- Encounter Discharge Information Details Patient Name: Date of Service: CALVARY, SPROTT 09/14/2022 3:45 PM Medical Record Number: [id number] Patient Account Number: [id number] Date of Birth/Sex: Treating RN: [birth year] (67 y.o. [name] Primary Care Ytzel Gubler: [name] Other Clinician: Referring Kaydance Bowie: Treating Curvin Hunger/Extender: Alver Fisher in Treatment: 18 Encounter Discharge Information Items Post Procedure Vitals Discharge Condition: Stable Temperature (F): 98.1 Ambulatory Status: [name] Pulse (bpm): 70 Discharge Destination: Home Respiratory Rate (breaths/min): 18 Transportation: Private Auto Blood Pressure (mmHg): 114/76 Accompanied By: self Schedule Follow-up Appointment: Yes Clinical Summary of Care: Electronic Signature(s) Signed: 09/14/2022 6:09:26 PM By: Shawn Stall RN, BSN Entered By: Shawn Stall on 09/14/2022 16:40:59 -------------------------------------------------------------------------------- Lower Extremity Assessment Details Patient Name: Date of Service: [name], DA [name] Neptune D. 09/14/2022 3:45 PM Medical Record Number: [id number] Patient Account Number: [id number] Date of Birth/Sex: Treating RN: 1955-10-10 (67 y.o. M) Primary Care [name]: [name] Other Clinician: Referring [name]: Treating Denya Buckingham/Extender: Alver Fisher in Treatment: 58 Edema Assessment Assessed:  [Left: No] [Right: No] Edema: [Left: N] [Right: o] Calf Left: Right: Point of Measurement: 31 cm From Medial Instep 40 cm Ankle Left: Right: Point of Measurement: 11 cm From Medial Instep 25 cm Vascular Assessment Extremity colors, hair growth, and conditions: Extremity Color: [Right:Normal] Hair Growth on Extremity: [Right:No] Temperature of Extremity: [Right:Warm] Capillary Refill: [Right:< 3 seconds] Dependent Rubor: [Right:No Yes] Electronic Signature(s) Signed: 09/21/2022 4:44:01 PM By: Thayer Dallas Entered By: Thayer Dallas on 09/14/2022 16:08:09 Helm, [name] ([id number]) [id number] Page 3 of 6 -------------------------------------------------------------------------------- Multi-Disciplinary Care Plan Details Patient Name: Date of  Service: SPIRO, VARGAZ 09/14/2022 3:45 PM Medical Record Number: [id number] Patient Account Number: [id number] Date of Birth/Sex: Treating RN: May 13, 1955 (67 y.o. [name] Primary Care Tamura Lasky: [name] Other Clinician: Referring Jakylan Ron: Treating Raneisha Bress/Extender: Alver Fisher in Treatment: 59 Multidisciplinary Care Plan reviewed with physician Active Inactive Pain, Acute or Chronic Nursing Diagnoses: Pain, acute or chronic: actual or potential Potential alteration in comfort, pain Goals: Patient will verbalize adequate pain control and receive pain control interventions during procedures as needed Date Initiated: 08/04/2021 Target Resolution Date: 01/10/2023 Goal Status: Active Patient/caregiver will verbalize comfort level met Date Initiated: 08/04/2021 Date Inactivated: 07/27/2022 Target Resolution Date: 09/10/2022 Goal Status: Met Interventions: Encourage patient to take pain medications as prescribed Provide education on pain management Reposition patient for comfort Treatment Activities: Administer pain control measures as ordered : 08/04/2021 Notes: Electronic  Signature(s) Signed: 09/14/2022 6:09:26 PM By: Shawn Stall RN, BSN Entered By: Shawn Stall on 09/14/2022 16:18:03 -------------------------------------------------------------------------------- Pain Assessment Details Patient Name: Date of Service: [name], DA [name] Neptune D. 09/14/2022 3:45 PM Medical Record Number: [id number] Patient Account Number: [id number] Date of Birth/Sex: Treating RN: Feb 10, 1955 (67 y.o. M) Primary Care Marquette Piontek: [name] Other Clinician: Referring Malone Vanblarcom: Treating Shay Jhaveri/Extender: Alver Fisher in Treatment: 58 Active Problems Location of Pain Severity and Description of Pain Patient Has Paino No Site Locations [place], [place] D ([id number]) [id number] Page 4 of 6 Pain Management and Medication Current Pain Management: Electronic Signature(s) Signed: 09/21/2022 4:44:01 PM By: Thayer Dallas Entered By: Thayer Dallas on 09/14/2022 16:08:25 -------------------------------------------------------------------------------- Patient/Caregiver Education Details Patient Name: Date of Service: [name] 9/4/2024andnbsp3:45 PM Medical Record Number: [id number] Patient Account Number: [id number] Date of Birth/Gender: Treating RN: 1955-11-26 (67 y.o. [name] Primary Care Physician: [name] Other Clinician: Referring Physician: Treating Physician/Extender: Alver Fisher in Treatment: 50 Education Assessment Education Provided To: Patient Education Topics Provided Wound/Skin Impairment: Handouts: Caring for Your Ulcer Methods: Explain/Verbal Responses: Reinforcements needed Electronic Signature(s) Signed: 09/14/2022 6:09:26 PM By: Shawn Stall RN, BSN Entered By: Shawn Stall on 09/14/2022 16:19:55 -------------------------------------------------------------------------------- Wound Assessment Details Patient Name: Date of Service: Pressnell, [name] 09/14/2022 3:45 PM Medical  Record Number: [id number] Patient Account Number: [id number] Date of Birth/Sex: Treating RN: [birth year] (67 y.o. M) Primary Care Braniyah Besse: [name] Other Clinician: KAYLAN, FENGER D ([id number]) [id number] Page 5 of 6 Referring Corena Tilson: Treating Aniayah Alaniz/Extender: Alver Fisher in Treatment: 58 Wound Status Wound Number: 7 Primary Venous Leg Ulcer Etiology: Wound Location: Right, Medial Malleolus Secondary Lymphedema Wounding Event: Blister Etiology: Date Acquired: 07/21/2021 Wound Open Weeks Of Treatment: 58 Status: Clustered Wound: No Comorbid Sleep Apnea, Arrhythmia, Congestive Heart Failure, History: Hypertension, Hypotension, Peripheral Arterial Disease, Peripheral Venous Disease, Type II Diabetes, Gout, Osteoarthritis Photos Wound Measurements Length: (cm) 3.4 Width: (cm) 1.8 Depth: (cm) 0.3 Area: (cm) 4.807 Volume: (cm) 1.442 % Reduction in Area: 87.4% % Reduction in Volume: 94.6% Epithelialization: Small (1-33%) Tunneling: No Undermining: No Wound Description Classification: Full Thickness With Exposed Supp Exudate Amount: Medium Exudate Type: Serosanguineous Exudate Color: red, brown ort Structures Foul Odor After Cleansing: No Slough/Fibrino Yes Wound Bed Granulation Amount: Large (67-100%) Exposed Structure Granulation Quality: Red Fascia Exposed: No Necrotic Amount: Small (1-33%) Fat Layer (Subcutaneous Tissue) Exposed: Yes Tendon Exposed: No Muscle Exposed: No Joint Exposed: No Bone Exposed: No Periwound Skin Texture Texture Color No Abnormalities Noted: No No Abnormalities Noted: No Moisture Temperature / Pain No Abnormalities Noted: No Temperature: No Abnormality  Dry / Scaly: Yes Electronic Signature(s) Signed: 09/21/2022 4:44:01 PM By: Thayer Dallas Entered By: Thayer Dallas on 09/14/2022 16:07:37 -------------------------------------------------------------------------------- Vitals  Details Patient Name: Date of Service: Hoelzer, [name] 09/14/2022 3:45 PM Medical Record Number: [id number] Patient Account Number: [id number] LADARRIUS, LIPINSKI D ([id number]) [id number] Page 6 of 6 Date of Birth/Sex: Treating RN: 01-12-1955 (67 y.o. M) Primary Care Dougles Kimmey: Other Clinician: [name] Referring Rosalba Totty: Treating Jaythen Hamme/Extender: Alver Fisher in Treatment: 58 Vital Signs Time Taken: 16:10 Temperature (F): 98.1 Height (in): 71 Pulse (bpm): 70 Weight (lbs): 350 Respiratory Rate (breaths/min): 18 Body Mass Index (BMI): 48.8 Blood Pressure (mmHg): 114/76 Reference Range: 80 - 120 mg / dl Electronic Signature(s) Signed: 09/21/2022 4:44:01 PM By: Thayer Dallas Entered By: Thayer Dallas on 09/14/2022 16:10:12

## 2022-09-21 NOTE — Progress Notes (Signed)
LINVILLE, BUCKERT ([id number]) [id number] Page 1 of 12 Visit Report for 09/21/2022 Chief Complaint Document Details Patient Name: Date of Service: DORANCE, PATITUCCI 09/21/2022 2:45 PM Medical Record Number: [id number] Patient Account Number: [id number] Date of Birth/Sex: Treating RN: 10-Mar-1955 (67 y.o. M) Primary Care Provider: [name] Other Clinician: Referring Provider: Treating Provider/Extender: Alver Fisher in Treatment: 29 Information Obtained from: Patient Chief Complaint 08/04/2021; patient returns to clinic with bilateral leg wounds as well as areas on the right foot Electronic Signature(s) Signed: 09/21/2022 2:34:58 PM By: Allen Derry PA-C Entered By: Allen Derry on 09/21/2022 11:34:58 -------------------------------------------------------------------------------- HPI Details Patient Name: Date of Service: Schlemmer, [name] 09/21/2022 2:45 PM Medical Record Number: [id number] Patient Account Number: [id number] Date of Birth/Sex: Treating RN: 05-Jul-1955 (67 y.o. M) Primary Care Provider: [name] Other Clinician: Referring Provider: Treating Provider/Extender: Alver Fisher in Treatment: 37 History of Present Illness HPI Description: ADMISSION 03/22/2021 This is a 67 year old man with a past medical history significant for diabetes type 2, congestive heart failure, peripheral arterial disease, morbid obesity, venous insufficiency, and coronary artery disease. He has been followed by Dr. Loreta Ave in podiatry, who performed a transmetatarsal amputation on the left foot in August 2022. He had issues healing that wound, but based upon Dr. Kenna Gilbert notes, ultimately the TMA wound healed. During his recovery from that surgery, however, ulcers opened up over the DIP joint of the right second and third toe. These have apparently closed and reopened multiple times. It sounds like one of the issues has been  moisture accumulation and maceration of the tissues causing them to reopen. At his last visit with Dr. Loreta Ave, on March 01, 2021, there continues to be problems with moisture and he was referred to wound care for further evaluation and management. He had a formal aortogram with runoff performed prior to his TMA. The findings are copied here: Patient has inline flow to both feet with no significant flow-limiting lesion that would be amenable to percutaneous or open revascularization. He does have an element of small vessel disease and has a short segment occlusion of the distal anterior tibial/dorsalis pedis artery on the left foot but does have posterior tibial artery flow. Would recommend management of wounds with amputation of toes 2 and 3 on the right foot if the wounds do not heal and deteriorate. Transmetatarsal amputation on the left side has as good a blood supply as it is going to get and hopefully this will heal in the future. Formal ABIs were done in January 2023. They are normal bilaterally. ABI Findings: +---------+------------------+-----+----------+--------+ Right Rt Pressure (mmHg)IndexWaveform Comment  +---------+------------------+-----+----------+--------+ Brachial 160     +---------+------------------+-----+----------+--------+ PTA 192 1.06 monophasic  +---------+------------------+-----+----------+--------+ DP 159 0.88 monophasic  +---------+------------------+-----+----------+--------+ Great T oe145 0.80    +---------+------------------+-----+----------+--------+ +--------+------------------+-----+---------+-------+ Rozetta Nunnery D ([id number]) [id number] Page 2 of 12 Left Lt Pressure (mmHg)IndexWaveform Comment +--------+------------------+-----+---------+-------+ [id number]     +--------+------------------+-----+---------+-------+ PTA 204 1.13 triphasic   +--------+------------------+-----+---------+-------+ DP 194 1.07 biphasic   +--------+------------------+-----+---------+-------+ +-------+-----------+-----------+------------+------------+ ABI/TBIT oday's ABIT oday's TBIPrevious ABIPrevious TBI +-------+-----------+-----------+------------+------------+ Right 1.06 0.8 1.26 0.65  +-------+-----------+-----------+------------+------------+ Left 1.13 amputation 1.15 amputation  +-------+-----------+-----------+------------+------------+ Previous ABI on 08/06/20 at [name] Pedal pressures falsely elevated due to medial calcification. Summary: Right: Resting right ankle-brachial index is within normal range. The right toe-brachial index is normal. Left: Resting left ankle-brachial index is within normal range. READMISSION 08/04/2021 Mr. Lico is now a 67 year old man who I remember from this clinic many years ago I  think he had a right lower extremity predominantly venous wound at the time. He was here for 1 visit in March of this year had wounds on his right second and third toes we apparently dressed them many and they healed so he did not come back. He is listed in [place] is being a diabetic although the patient denies this says he is verified it with his primary doctor. In any case over the last several weeks or so according the patient although these wounds look somewhat more chronic than that he has developed predominantly large wounds on the right medial and right lateral ankle smaller areas on the left leg and areas on the dorsal aspect of his right second and third toes. Its not clear how he has been dressing these. 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 The patient has known PAD. He had an angiogram in August 2022 at which time he had nonhealing wounds in both feet. On the left his major vessels in the thigh were all  patent. He had three-vessel patent to the level of the ankle. He had a very short occlusion in the left anterior tibial. On the right lower extremity the major vessels in his thigh were all patent. He had three-vessel runoff to the foot sluggish filling of the anterior tibial artery. He was felt to have some component of small vessel disease but nothing that was amenable or needed revascularization. It was recommended that he have amputation of the second and third toes on the right foot if they did not heal He has been following with Dr. Loreta Ave of podiatry. Dr. Loreta Ave got him juxta lite stockings although I do not think he had them on properly he has uncontrolled edema in both legs Past medical history includes type 2 diabetes [although the patient really denies this], left TMA in 2022,lower extremity wounds in fact attendance at this clinic in 2009-2010, A-fib on Eliquis, chronic venous insufficiency with secondary lymphedema history of non-Hodgkin's lymphoma. 08-11-2021 upon evaluation today patient presents for follow-up evaluation he was seen last Wednesday initially for inspection here in our clinic. With that being said he tells me that he unfortunately has been having a lot of drainage and is actually coming through his wrap. Fortunately I do not see any evidence of active infection locally or systemically at this time which is great news. No fevers, chills, nausea, vomiting, or diarrhea. With that being said there does appear to be some evidence of local infection based on what I am seeing today. 08-18-2021 upon evaluation today patient appears to be doing okay currently in regard to his wounds with that being said that he is doing much better but still has a long ways to go to get to where he wanted to be. I think the infection is significantly improved. He has another week of the antibiotic at this point. 08-25-2021 upon evaluation today patient's wounds are actually doing decently well he has  erythema has significantly improved. I think the cellulitis is under controlled I am going to continue him on 2 more weeks of the Levaquin at 500 mg this is a lower dose but I am hoping it will be better for him. 09-08-2021 upon evaluation today patient appears to be doing excellent in regard to his wounds. Since I last saw him he was actually in the hospital where he ended up having a pacemaker put in. Subsequently he tells  me that he is actually doing quite well although they were unsure whether they were going to do it due to the fact that he had the wounds on his legs. And then I am glad they did anything seem to be doing well. 09-15-2021 upon evaluation patient's wounds are actually showing signs of improvement. The right side wounds do appear to have some need for sharp debridement today and I Ernie Hew go ahead and proceed with that. I think that if we get the wounds cleaned up he will actually show signs of continued improvement. I am also leaning towards switching to [facility] which I think will be a much better option for him. 09-22-2021 patient's wounds are showing signs of excellent improvement. I am actually extremely pleased with where we stand and I think that the patient is making great progress. There does not appear to be any signs of active infection. 09-29-2021 upon evaluation today patient appears to be doing excellent in regard to his wounds. He is actually tolerating the dressing changes without complication. Fortunately I see no evidence of active infection locally or systemically at this time which is great news and overall I am extremely pleased with where we stand currently. [id number] upon evaluation today patient actually appears to be doing excellent in regard to his wounds. The left leg is almost completely healed the right ankle is significantly smaller. Overall I am extremely pleased with where we stand at this point. I do not see any evidence of active infection at this  time. 10-13-2021 upon evaluation today patient appears to be doing excellent in regard to his wounds. I really feel like he is making good progress here and I am very pleased in that regard. Fortunately I do not see any signs of active infection at this time. We are using the [facility] topical antibiotic therapy. 10-20-2021 upon evaluation today patient appears to be doing well with regard to his wound on the right medial ankle region the left leg is almost completely healed. I am actually very pleased with where we stand today. 11-03-2021 upon evaluation today patient's wound actually is going require some sharp debridement but appears to be doing much better which is great news. Fortunately I do not see any signs of active infection at this time. 11-10-2021 upon evaluation today patient's wound is actually showing signs of excellent improvement. Fortunately I do not see any evidence of infection locally GATES, DEMOULIN D ([id number]) [id number] Page 3 of 12 or systemically which is great news and overall I am extremely pleased with where we stand today. I do believe he is making good progress he does have his [facility] topical antibiotics with him here today. 11-24-2021 upon evaluation today patient actually showing signs of excellent improvement this appears to be doing much better. Fortunately I do not see any evidence of infection locally or systemically at this time. 12-01-2021 upon evaluation today patient appears to be doing well currently in regard to his wound. He has been tolerating the dressing changes without complication. Fortunately I do not see any evidence of active infection at this time which is great news and overall I am extremely pleased with where we stand today. 12-15-2021 upon evaluation today patient appears to be doing well with regard to his wound. He is showing signs of improvement is slow but nonetheless we are making improvements here. Fortunately I  do not see any evidence of active infection locally nor systemically at this time. We are still using the [facility] topical antibiotic  over the open area only. 12-29-2021 upon evaluation today patient's wound actually is showing signs of excellent improvement. It has been 2 weeks since I perform any debridement and it definitely shows he has some tissue that needs to be cleaned away but I think we can do so quite easily and readily today. The good news is I do not see any signs of infection I think he is doing much better in that regard. Overall I am extremely pleased with where we stand. 01-12-2022 upon evaluation today patient appears to be doing well currently in regard to his wound although it is not getting significantly smaller it is also not getting any larger. Fortunately I do not see any evidence of infection locally nor systemically which is great news and overall I am extremely pleased with where things stand currently. 01-19-2022 upon evaluation today patient appears to be doing well currently in regard to his wound. The PolyMem actually seems to have done extremely well for him. Fortunately I do not see any signs of infection locally nor systemically at this time. 01-26-2022 upon evaluation today patient appears to be making progress. Fortunately there does not appear to be any signs of infection which is great news. No fevers, chills, nausea, vomiting, or diarrhea. With that being said this is very slow to heal and although it is smaller I still feel like we may want to try to do something to speed this up I think that a skin substitute could be beneficial, look into Kerecis. 02-02-2022 upon evaluation today patient's wound is actually showing signs of excellent improvement. I do not see any evidence of infection and overall I think that we are headed in the right direction. Fortunately I think that he is tolerating the dressing changes without complication. 02-09-2022 upon evaluation today  patient appears to be doing well currently in regard to his wound. It does look a little bit macerated but fortunately does not appear to be showing any signs of significant skin breakdown over the macerated area which is good news. Fortunately I do not see any evidence of active infection locally nor systemically at this point which is great news. 02-23-2022 upon evaluation today patient appears to be doing well currently in regard to his wounds. In fact his area on the ankle is actually showing signs of healing quite nicely. We do have the Apligraf ready today I am hopeful this is going to speed things up and get this closed much more effectively and quickly. Fortunately I do not see any signs of active infection locally nor systemically at this time which is great news. 03-02-2022 upon evaluation today patient appears to be doing excellent in regard to his wound. He is actually been tolerating the dressing changes without complication the wound on the left medial lower extremity is doing quite well and Apligraf seems to have been extremely beneficial for him. I am extremely pleased with where we stand today. 03/16/2022: The wound measurements are smaller today. He has accumulation of a yellow crust around the edges and slough on the surface. There is a musty odor coming from the wound. He has been getting Apligraf. 03-30-2022 upon evaluation today patient appears to be making progress. In regard to his leg ulcer. This is slow but nonetheless the Apligraf has fed things up. Fortunately I do not see any evidence of infection locally nor systemically at this time. No fevers, chills, nausea, vomiting, or diarrhea. Patient is here for Apligraf #4 today. 04-13-2022 upon evaluation today patient  appears to be doing a little bit more poorly in regard to his wound. He actually feels like his wrap may have been a little bit tight. Fortunately there does not appear to be any signs of active infection locally nor  systemically which is great news and I am pleased in that regard. 04-20-2022 upon evaluation today patient appears to be doing better in regard to his wound from the standpoint of swelling he is actually lost 33 pounds on torsemide in the past week his leg is significantly smaller compared to what it has been. Fortunately I do not see any signs of active infection locally nor systemically at this time. 04-27-2022 upon evaluation today patient's wound actually is showing signs of erythema and warmth around the edges of the wound I am actually concerned about the possibility of infection. I think we probably need to obtain a wound culture and also can recommend based on what we are seeing that we go ahead and have the patient continue to utilize the compression wrapping which I think has been of benefit. 05-04-2022 upon evaluation today patient appears to be doing well currently in regard to his wound. He has been tolerating the dressing changes without complication. Fortunately there does not appear to be any signs of active infection locally or systemically which is great news. 05-11-2022 upon evaluation today patient actually appears to be doing excellent. He has been tolerating the dressing changes we actually were trialing a new medication on him last week and this was the ergo clean Ag dressing. With that being said this actually seems to have done extremely well at this point. Fortunately I do not see any signs of active infection locally or systemically at this time. 05-18-2022 upon evaluation today patient appears to be doing okay in regard to his wound I do not see any signs of worsening also notes any signs of significant improvement. Fortunately I do believe that removing in the right direction in general. 05-25-22 upon evaluation today patient appears to be doing well currently in regard to his wound. He has been tolerating the dressing changes without complication. Fortunately I do not see any  signs of active infection locally nor systemically which is great news and overall I am extremely pleased with where we stand currently. There does not appear to be any signs of active infection locally nor systemically which is great news. No fevers, chills, nausea, vomiting, or diarrhea. 06-01-2022 upon evaluation today patient actually appears to be making excellent progress in regard to his ankle region. I am very pleased with where we stand I think that he is doing extremely well and very pleased with endoform and drawtex combination. 06-08-2022 upon evaluation today patient appears to be doing well currently in regard to his wound. He has been making good progress and I feel like practicing improvement in the overall size of the wound. I am very pleased in that regard. Fortunately I do not see any signs of active infection locally nor systemically which is great news. No fevers, chills, nausea, vomiting, or diarrhea. 06-15-2022 upon evaluation today patient appears to be doing well currently in regard to his wound. He has been tolerating the dressing changes without complication. I do believe the endoform has been extremely helpful for him up to this point. I do not see any signs of active infection locally nor systemically which is great news and in general I do believe that we are making pretty good progress here which is excellent news. 06-22-2022 upon  evaluation today patient appears to be doing well currently in regard to his wound. He has been tolerating the dressing changes without complication. Fortunately there does not appear to be any signs of active infection at this time which is great news. I think that he is actually taking good progress with the endoform which is excellent as well. FREDDERICK, FEDOROV ([id number]) [id number] Page 4 of 12 06-29-2022 upon evaluation patient appears to be doing well currently in regard to his wound. He is actually making good  progress towards closure and I am very pleased in that regard. I do not see any evidence of active infection locally nor systemically which is great news and in general I do believe that we are making excellent progress here overall. 07-06-2022 upon evaluation today patient appears to be doing well currently in regard to his wound. He has been tolerating the dressing changes without complication. The good news is he is slowly getting smaller each time I see him. 07-13-2022 upon evaluation today patient appears to be doing well currently in regard to his wound is slowly showing signs of improvement. Fortunately I do not see any signs of infection at this time. No fevers, chills, nausea, vomiting, or diarrhea. 07-20-2022 upon evaluation today patient appears to be doing well currently in regard to his wound. He has been tolerating the dressing changes without complication. Fortunately I do not see any evidence of active infection locally or systemically which is great news and in general I do believe that we are making headway towards complete closure. This is excellent news. With that being said we are preparing for him to be leaving to go out of town in about a week and a half. For that reason we will going to make a few changes to his dressing today in order to give him something that I think will be easier for him to do when he is out of town. 07-27-2022 upon evaluation today patient appears to be doing well currently in regard to his wound. He has been tolerating the dressing changes without complication. Fortunately there does not appear to be any signs of infection. Fortunately there does not appear to be any signs of infection 08-10-2022 upon evaluation today patient appears to be doing okay in regard to his wound. He actually has been gone for 2 weeks and in the interim unfortunately he did not have result out of the Tegaderm as well as hoping for but nonetheless took good care of his wound. Does not  appear to be infected I do think we need to go back to the compression wrap however which I think would definitely benefit him. Fortunately I do not see any signs of active infection at this time which is great news. 08-17-2022 upon evaluation today patient unfortunately appears to be showing signs of infection based on what I see at this time. Fortunately I do not see any evidence of systemic infection though locally I do definitely see some changes that are concerning. I do not think that this is systemic at all. 08-24-2022 upon evaluation today patient appears to be doing well currently in regard to his wound. He has been tolerating the dressing changes this looks significantly improved compared to last week and very pleased. I do not see any signs of active infection locally or systemically at this time. 08-31-2022 upon evaluation today patient appears to be doing well currently in regard to his wound this is actually showing signs of improvement and actually very pleased  with where we stand today. Fortunately I do not see any signs of active infection which is great news. 09-14-2022 upon evaluation today patient appears to be doing well current regard to his wound with section showing signs of improvement would be using endoform which I think is really doing a great job here. Fortunately I do not see any signs of worsening overall. 09-21-2022 upon evaluation today patient appears to be doing well currently in regard to his wound. He has been tolerating the dressing changes without complication. Fortunately there does not appear to be any signs of active infection at this time which is good news. Electronic Signature(s) Signed: 09/21/2022 3:34:00 PM By: Allen Derry PA-C Entered By: Allen Derry on 09/21/2022 12:34:00 -------------------------------------------------------------------------------- Physical Exam Details Patient Name: Date of Service: Duignan, [name] 09/21/2022 2:45 PM Medical Record  Number: [id number] Patient Account Number: [id number] Date of Birth/Sex: Treating RN: 17-Feb-1955 (67 y.o. M) Primary Care Provider: [name] Other Clinician: Referring Provider: Treating Provider/Extender: Alver Fisher in Treatment: 59 Constitutional Obese and well-hydrated in no acute distress. Respiratory normal breathing without difficulty. Psychiatric this patient is able to make decisions and demonstrates good insight into disease process. Alert and Oriented x 3. pleasant and cooperative. Notes Upon inspection patient's wound bed actually showed evidence of some improvement is still very slow. With that being said I feel like we will get back on track after his vacation things are gone downhill just a bit and I think we are back kind of where we were prior to that vacation time. With that being said he still has the small area out to the side which makes this measured a little bit larger. Nonetheless I do believe that I would like to give 1 more week with the endoform and see how this does if he is not really showing signs of dramatic improvement then we will likely switch over to Aquacel to see how that we will do. Electronic Signature(s) Signed: 09/21/2022 3:34:36 PM By: Allen Derry PA-C Entered By: Allen Derry on 09/21/2022 12:34:36 Eckersley, [name] ([id number]) [id number] Page 5 of 12 -------------------------------------------------------------------------------- Physician Orders Details Patient Name: Date of Service: ISMAILA, PELSTER 09/21/2022 2:45 PM Medical Record Number: [id number] Patient Account Number: [id number] Date of Birth/Sex: Treating RN: 08-10-1955 (66 y.o. Harlon Flor, Millard.Loa Primary Care Provider: [name] Other Clinician: Referring Provider: Treating Provider/Extender: Alver Fisher in Treatment: 26 Verbal / Phone Orders: No Diagnosis Coding ICD-10 Coding Code Description I89.0  Lymphedema, not elsewhere classified I87.333 Chronic venous hypertension (idiopathic) with ulcer and inflammation of bilateral lower extremity L97.828 Non-pressure chronic ulcer of other part of left lower leg with other specified severity L97.818 Non-pressure chronic ulcer of other part of right lower leg with other specified severity E11.621 Type 2 diabetes mellitus with foot ulcer L97.518 Non-pressure chronic ulcer of other part of right foot with other specified severity Follow-up Appointments ppointment in 1 week. Leonard Schwartz Wednesday 09/21/2022 (already scheduled) Return A ppointment in 2 weeks. Leonard Schwartz Wednesday 09/28/2022 (already scheduled) Return A Return appointment in 3 weeks. Leonard Schwartz Wednesday 10/05/2022 (already scheduled) Anesthetic (In clinic) Topical Lidocaine 5% applied to wound bed - Used in Clinic Prior to debridement (In clinic) Topical Lidocaine 4% applied to wound bed Cellular or Tissue Based Products Cellular or Tissue Based Product Type: - Run IVR for Kerrecis- denied #1 Apligraf applied 02/23/2022 #2 Apligraf applied 03/02/2022 #3 Apligraf applied 03/16/2022 #4 Apligraf applied 03/30/2022 HOLD APLIGRAF this  week. Cellular or Tissue Based Product applied to wound bed, secured with steri-strips, cover with Adaptic or Mepitel. (DO NOT REMOVE). Bathing/ Shower/ Hygiene May shower with protection but do not get wound dressing(s) wet. Protect dressing(s) with water repellant cover (for example, large plastic bag) or a cast cover and may then take shower. Edema Control - Lymphedema / SCD / Other Elevate legs to the level of the heart or above for 30 minutes daily and/or when sitting for 3-4 times a day throughout the day. Avoid standing for long periods of time. Patient to wear own compression stockings every day. - apply in the morning and remove at night left leg. Exercise regularly Moisturize legs daily. - apply every night before bed. Compression stocking or Garment 30-40  mm/Hg pressure to: - wear your VIVE compression stocking to left leg. apply in the morning and remove at night. Wound Treatment Wound #7 - Malleolus Wound Laterality: Right, Medial Cleanser: Soap and Water 1 x Per Week/30 Days Discharge Instructions: May shower and wash wound with dial antibacterial soap and water prior to dressing change. Cleanser: Vashe 5.8 (oz) 1 x Per Week/30 Days Discharge Instructions: Cleanse the wound with Vashe prior to applying a clean dressing using gauze sponges, not tissue or cotton balls. Peri-Wound Care: Sween Lotion (Moisturizing lotion) 1 x Per Week/30 Days Discharge Instructions: Apply moisturizing lotion as directed Topical: Gentamicin 1 x Per Week/30 Days Discharge Instructions: As directed by physician Prim Dressing: Endoform 2x2 in (Dispense As Written) 1 x Per Week/30 Days ary Discharge Instructions: Moisten with saline Secondary Dressing: CarboFLEX Odor Control Dressing, 4x4 in 1 x Per Week/30 Days Discharge Instructions: Apply over primary dressing as directed. LEONCIO, KERL ([id number]) [id number] Page 6 of 12 Secondary Dressing: Drawtex 4x4 in 1 x Per Week/30 Days Discharge Instructions: Apply over primary dressing as directed. Secondary Dressing: Zetuvit Plus 4x8 in 1 x Per Week/30 Days Discharge Instructions: Apply over primary dressing as directed. Compression Wrap: Urgo K2, (equivalent to a 4 layer) two layer compression system, regular 1 x Per Week/30 Days Discharge Instructions: Apply Urgo K2 as directed (alternative to 4 layer compression). Electronic Signature(s) Unsigned Entered By: Shawn Stall on 09/21/2022 12:29:59 -------------------------------------------------------------------------------- Problem List Details Patient Name: Date of Service: TANNEN, BENNION 09/21/2022 2:45 PM Medical Record Number: [id number] Patient Account Number: [id number] Date of Birth/Sex: Treating RN: [birth year] (67 y.o.  M) Primary Care Provider: [name] Other Clinician: Referring Provider: Treating Provider/Extender: Alver Fisher in Treatment: 59 Active Problems ICD-10 Encounter Code Description Active Date MDM Diagnosis I89.0 Lymphedema, not elsewhere classified 08/04/2021 No Yes I87.333 Chronic venous hypertension (idiopathic) with ulcer and inflammation of 08/04/2021 No Yes bilateral lower extremity L97.828 Non-pressure chronic ulcer of other part of left lower leg with other specified 08/04/2021 No Yes severity L97.818 Non-pressure chronic ulcer of other part of right lower leg with other specified 08/04/2021 No Yes severity E11.621 Type 2 diabetes mellitus with foot ulcer 08/04/2021 No Yes L97.518 Non-pressure chronic ulcer of other part of right foot with other specified 08/04/2021 No Yes severity Inactive Problems Resolved Problems Electronic Signature(s) Signed: 09/21/2022 2:34:17 PM By: Dava Najjar, [name] ([id number]) [id number] Page 7 of 12 Signed: 09/21/2022 2:34:17 PM By: Allen Derry PA-C Entered By: Allen Derry on 09/21/2022 11:34:16 -------------------------------------------------------------------------------- Progress Note Details Patient Name: Date of Service: Colter, [name] 09/21/2022 2:45 PM Medical Record Number: [id number] Patient Account Number: [id number] Date of Birth/Sex: Treating RN: 1955/12/25 (67 y.o. M) Primary Care  Provider: [name] Other Clinician: Referring Provider: Treating Provider/Extender: Alver Fisher in Treatment: 59 Subjective Chief Complaint Information obtained from Patient 08/04/2021; patient returns to clinic with bilateral leg wounds as well as areas on the right foot History of Present Illness (HPI) ADMISSION 03/22/2021 This is a 67 year old man with a past medical history significant for diabetes type 2, congestive heart failure, peripheral arterial  disease, morbid obesity, venous insufficiency, and coronary artery disease. He has been followed by Dr. Loreta Ave in podiatry, who performed a transmetatarsal amputation on the left foot in August 2022. He had issues healing that wound, but based upon Dr. Kenna Gilbert notes, ultimately the TMA wound healed. During his recovery from that surgery, however, ulcers opened up over the DIP joint of the right second and third toe. These have apparently closed and reopened multiple times. It sounds like one of the issues has been moisture accumulation and maceration of the tissues causing them to reopen. At his last visit with Dr. Loreta Ave, on March 01, 2021, there continues to be problems with moisture and he was referred to wound care for further evaluation and management. He had a formal aortogram with runoff performed prior to his TMA. The findings are copied here: Patient has inline flow to both feet with no significant flow-limiting lesion that would be amenable to percutaneous or open revascularization. He does have an element of small vessel disease and has a short segment occlusion of the distal anterior tibial/dorsalis pedis artery on the left foot but does have posterior tibial artery flow. Would recommend management of wounds with amputation of toes 2 and 3 on the right foot if the wounds do not heal and deteriorate. Transmetatarsal amputation on the left side has as good a blood supply as it is going to get and hopefully this will heal in the future. Formal ABIs were done in January 2023. They are normal bilaterally. ABI Findings: +---------+------------------+-----+----------+--------+ Right Rt Pressure (mmHg)IndexWaveform Comment  +---------+------------------+-----+----------+--------+ Brachial 160    +---------+------------------+-----+----------+--------+ PTA 192 1.06 monophasic  +---------+------------------+-----+----------+--------+ DP 159 0.88 monophasic   +---------+------------------+-----+----------+--------+ Great T oe145 0.80    +---------+------------------+-----+----------+--------+ +--------+------------------+-----+---------+-------+ Left Lt Pressure (mmHg)IndexWaveform Comment +--------+------------------+-----+---------+-------+ [id number]    +--------+------------------+-----+---------+-------+ PTA 204 1.13 triphasic  +--------+------------------+-----+---------+-------+ DP 194 1.07 biphasic   +--------+------------------+-----+---------+-------+ +-------+-----------+-----------+------------+------------+ ABI/TBIT oday's ABIT oday's TBIPrevious ABIPrevious TBI +-------+-----------+-----------+------------+------------+ Right 1.06 0.8 1.26 0.65  +-------+-----------+-----------+------------+------------+ Left 1.13 amputation 1.15 amputation  +-------+-----------+-----------+------------+------------+ Previous ABI on 08/06/20 at [facility] Pedal pressures falsely elevated due to medial calcification. Summary: Right: Resting right ankle-brachial index is within normal range. The right toe-brachial index is normal. Left: Resting left ankle-brachial index is within normal range. DAJUAN, ELVIN ([id number]) [id number] Page 8 of 12 READMISSION 08/04/2021 Mr. Skau is now a 67 year old man who I remember from this clinic many years ago I think he had a right lower extremity predominantly venous wound at the time. He was here for 1 visit in March of this year had wounds on his right second and third toes we apparently dressed them many and they healed so he did not come back. He is listed in [place] is being a diabetic although the patient denies this says he is verified it with his primary doctor. In any case over the last several weeks or so according the patient although these wounds look somewhat more chronic than that he has developed predominantly  large wounds on the right medial and right lateral ankle smaller areas on the left leg and areas on the dorsal  aspect of his right second and third toes. Its not clear how he has been dressing these. 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 The patient has known PAD. He had an angiogram in August 2022 at which time he had nonhealing wounds in both feet. On the left his major vessels in the thigh were all patent. He had three-vessel patent to the level of the ankle. He had a very short occlusion in the left anterior tibial. On the right lower extremity the major vessels in his thigh were all patent. He had three-vessel runoff to the foot sluggish filling of the anterior tibial artery. He was felt to have some component of small vessel disease but nothing that was amenable or needed revascularization. It was recommended that he have amputation of the second and third toes on the right foot if they did not heal He has been following with Dr. Loreta Ave of podiatry. Dr. Loreta Ave got him juxta lite stockings although I do not think he had them on properly he has uncontrolled edema in both legs Past medical history includes type 2 diabetes [although the patient really denies this], left TMA in 2022,lower extremity wounds in fact attendance at this clinic in 2009-2010, A-fib on Eliquis, chronic venous insufficiency with secondary lymphedema history of non-Hodgkin's lymphoma. 08-11-2021 upon evaluation today patient presents for follow-up evaluation he was seen last Wednesday initially for inspection here in our clinic. With that being said he tells me that he unfortunately has been having a lot of drainage and is actually coming through his wrap. Fortunately I do not see any evidence of active infection locally or systemically at this time which is great news. No fevers, chills, nausea, vomiting, or diarrhea. With that being said there  does appear to be some evidence of local infection based on what I am seeing today. 08-18-2021 upon evaluation today patient appears to be doing okay currently in regard to his wounds with that being said that he is doing much better but still has a long ways to go to get to where he wanted to be. I think the infection is significantly improved. He has another week of the antibiotic at this point. 08-25-2021 upon evaluation today patient's wounds are actually doing decently well he has erythema has significantly improved. I think the cellulitis is under controlled I am going to continue him on 2 more weeks of the Levaquin at 500 mg this is a lower dose but I am hoping it will be better for him. 09-08-2021 upon evaluation today patient appears to be doing excellent in regard to his wounds. Since I last saw him he was actually in the hospital where he ended up having a pacemaker put in. Subsequently he tells me that he is actually doing quite well although they were unsure whether they were going to do it due to the fact that he had the wounds on his legs. And then I am glad they did anything seem to be doing well. 09-15-2021 upon evaluation patient's wounds are actually showing signs of improvement. The right side wounds do appear to have some need for sharp debridement today and I Ernie Hew go ahead and proceed with that. I think that if we get the wounds cleaned up he will actually show signs of continued improvement. I am also leaning towards switching to [facility] which I think will be a much better option for him. 09-22-2021 patient's wounds  are showing signs of excellent improvement. I am actually extremely pleased with where we stand and I think that the patient is making great progress. There does not appear to be any signs of active infection. 09-29-2021 upon evaluation today patient appears to be doing excellent in regard to his wounds. He is actually tolerating the dressing changes  without complication. Fortunately I see no evidence of active infection locally or systemically at this time which is great news and overall I am extremely pleased with where we stand currently. 06-1605 upon evaluation today patient actually appears to be doing excellent in regard to his wounds. The left leg is almost completely healed the right ankle is significantly smaller. Overall I am extremely pleased with where we stand at this point. I do not see any evidence of active infection at this time. 10-13-2021 upon evaluation today patient appears to be doing excellent in regard to his wounds. I really feel like he is making good progress here and I am very pleased in that regard. Fortunately I do not see any signs of active infection at this time. We are using the [facility] topical antibiotic therapy. 10-20-2021 upon evaluation today patient appears to be doing well with regard to his wound on the right medial ankle region the left leg is almost completely healed. I am actually very pleased with where we stand today. 11-03-2021 upon evaluation today patient's wound actually is going require some sharp debridement but appears to be doing much better which is great news. Fortunately I do not see any signs of active infection at this time. 11-10-2021 upon evaluation today patient's wound is actually showing signs of excellent improvement. Fortunately I do not see any evidence of infection locally or systemically which is great news and overall I am extremely pleased with where we stand today. I do believe he is making good progress he does have his [facility] topical antibiotics with him here today. 11-24-2021 upon evaluation today patient actually showing signs of excellent improvement this appears to be doing much better. Fortunately I do not see any evidence of infection locally or systemically at this time. 12-01-2021 upon evaluation today patient appears to be doing well currently in regard to his  wound. He has been tolerating the dressing changes without complication. Fortunately I do not see any evidence of active infection at this time which is great news and overall I am extremely pleased with where we stand today. 12-15-2021 upon evaluation today patient appears to be doing well with regard to his wound. He is showing signs of improvement is slow but nonetheless we are making improvements here. Fortunately I do not see any evidence of active infection locally nor systemically at this time. We are still using the [facility] topical antibiotic over the open area only. 12-29-2021 upon evaluation today patient's wound actually is showing signs of excellent improvement. It has been 2 weeks since I perform any debridement and it definitely shows he has some tissue that needs to be cleaned away but I think we can do so quite easily and readily today. The good news is I do not see any signs of infection I think he is doing much better in that regard. Overall I am extremely pleased with where we stand. 01-12-2022 upon evaluation today patient appears to be doing well currently in regard to his wound although it is not getting significantly smaller it is also not getting any larger. Fortunately I do not see any evidence of infection locally nor systemically which  is great news and overall I am extremely pleased with where things stand currently. 01-19-2022 upon evaluation today patient appears to be doing well currently in regard to his wound. The PolyMem actually seems to have done extremely well for him. Fortunately I do not see any signs of infection locally nor systemically at this time. 01-26-2022 upon evaluation today patient appears to be making progress. Fortunately there does not appear to be any signs of infection which is great news. No fevers, chills, nausea, vomiting, or diarrhea. With that being said this is very slow to heal and although it is smaller I still feel like we may want to try to  do something to speed this up I think that a skin substitute could be beneficial, look into Kerecis. TAYEN, BEARDALL ([id number]) [id number] Page 9 of 12 02-02-2022 upon evaluation today patient's wound is actually showing signs of excellent improvement. I do not see any evidence of infection and overall I think that we are headed in the right direction. Fortunately I think that he is tolerating the dressing changes without complication. 02-09-2022 upon evaluation today patient appears to be doing well currently in regard to his wound. It does look a little bit macerated but fortunately does not appear to be showing any signs of significant skin breakdown over the macerated area which is good news. Fortunately I do not see any evidence of active infection locally nor systemically at this point which is great news. 02-23-2022 upon evaluation today patient appears to be doing well currently in regard to his wounds. In fact his area on the ankle is actually showing signs of healing quite nicely. We do have the Apligraf ready today I am hopeful this is going to speed things up and get this closed much more effectively and quickly. Fortunately I do not see any signs of active infection locally nor systemically at this time which is great news. 03-02-2022 upon evaluation today patient appears to be doing excellent in regard to his wound. He is actually been tolerating the dressing changes without complication the wound on the left medial lower extremity is doing quite well and Apligraf seems to have been extremely beneficial for him. I am extremely pleased with where we stand today. 03/16/2022: The wound measurements are smaller today. He has accumulation of a yellow crust around the edges and slough on the surface. There is a musty odor coming from the wound. He has been getting Apligraf. 03-30-2022 upon evaluation today patient appears to be making progress. In regard to his leg ulcer.  This is slow but nonetheless the Apligraf has fed things up. Fortunately I do not see any evidence of infection locally nor systemically at this time. No fevers, chills, nausea, vomiting, or diarrhea. Patient is here for Apligraf #4 today. 04-13-2022 upon evaluation today patient appears to be doing a little bit more poorly in regard to his wound. He actually feels like his wrap may have been a little bit tight. Fortunately there does not appear to be any signs of active infection locally nor systemically which is great news and I am pleased in that regard. 04-20-2022 upon evaluation today patient appears to be doing better in regard to his wound from the standpoint of swelling he is actually lost 33 pounds on torsemide in the past week his leg is significantly smaller compared to what it has been. Fortunately I do not see any signs of active infection locally nor systemically at this time. 04-27-2022 upon evaluation today patient's wound  actually is showing signs of erythema and warmth around the edges of the wound I am actually concerned about the possibility of infection. I think we probably need to obtain a wound culture and also can recommend based on what we are seeing that we go ahead and have the patient continue to utilize the compression wrapping which I think has been of benefit. 05-04-2022 upon evaluation today patient appears to be doing well currently in regard to his wound. He has been tolerating the dressing changes without complication. Fortunately there does not appear to be any signs of active infection locally or systemically which is great news. 05-11-2022 upon evaluation today patient actually appears to be doing excellent. He has been tolerating the dressing changes we actually were trialing a new medication on him last week and this was the ergo clean Ag dressing. With that being said this actually seems to have done extremely well at this point. Fortunately I do not see any signs of  active infection locally or systemically at this time. 05-18-2022 upon evaluation today patient appears to be doing okay in regard to his wound I do not see any signs of worsening also notes any signs of significant improvement. Fortunately I do believe that removing in the right direction in general. 05-25-22 upon evaluation today patient appears to be doing well currently in regard to his wound. He has been tolerating the dressing changes without complication. Fortunately I do not see any signs of active infection locally nor systemically which is great news and overall I am extremely pleased with where we stand currently. There does not appear to be any signs of active infection locally nor systemically which is great news. No fevers, chills, nausea, vomiting, or diarrhea. 06-01-2022 upon evaluation today patient actually appears to be making excellent progress in regard to his ankle region. I am very pleased with where we stand I think that he is doing extremely well and very pleased with endoform and drawtex combination. 06-08-2022 upon evaluation today patient appears to be doing well currently in regard to his wound. He has been making good progress and I feel like practicing improvement in the overall size of the wound. I am very pleased in that regard. Fortunately I do not see any signs of active infection locally nor systemically which is great news. No fevers, chills, nausea, vomiting, or diarrhea. 06-15-2022 upon evaluation today patient appears to be doing well currently in regard to his wound. He has been tolerating the dressing changes without complication. I do believe the endoform has been extremely helpful for him up to this point. I do not see any signs of active infection locally nor systemically which is great news and in general I do believe that we are making pretty good progress here which is excellent news. 06-22-2022 upon evaluation today patient appears to be doing well currently in  regard to his wound. He has been tolerating the dressing changes without complication. Fortunately there does not appear to be any signs of active infection at this time which is great news. I think that he is actually taking good progress with the endoform which is excellent as well. 06-29-2022 upon evaluation patient appears to be doing well currently in regard to his wound. He is actually making good progress towards closure and I am very pleased in that regard. I do not see any evidence of active infection locally nor systemically which is great news and in general I do believe that we are making excellent progress  here overall. 07-06-2022 upon evaluation today patient appears to be doing well currently in regard to his wound. He has been tolerating the dressing changes without complication. The good news is he is slowly getting smaller each time I see him. 07-13-2022 upon evaluation today patient appears to be doing well currently in regard to his wound is slowly showing signs of improvement. Fortunately I do not see any signs of infection at this time. No fevers, chills, nausea, vomiting, or diarrhea. 07-20-2022 upon evaluation today patient appears to be doing well currently in regard to his wound. He has been tolerating the dressing changes without complication. Fortunately I do not see any evidence of active infection locally or systemically which is great news and in general I do believe that we are making headway towards complete closure. This is excellent news. With that being said we are preparing for him to be leaving to go out of town in about a week and a half. For that reason we will going to make a few changes to his dressing today in order to give him something that I think will be easier for him to do when he is out of town. 07-27-2022 upon evaluation today patient appears to be doing well currently in regard to his wound. He has been tolerating the dressing changes  without complication. Fortunately there does not appear to be any signs of infection. Fortunately there does not appear to be any signs of infection 08-10-2022 upon evaluation today patient appears to be doing okay in regard to his wound. He actually has been gone for 2 weeks and in the interim unfortunately he did not have result out of the Tegaderm as well as hoping for but nonetheless took good care of his wound. Does not appear to be infected I do think we need to go back to the compression wrap however which I think would definitely benefit him. Fortunately I do not see any signs of active infection at this time which is great news. 08-17-2022 upon evaluation today patient unfortunately appears to be showing signs of infection based on what I see at this time. Fortunately I do not see any evidence of systemic infection though locally I do definitely see some changes that are concerning. I do not think that this is systemic at all. 08-24-2022 upon evaluation today patient appears to be doing well currently in regard to his wound. He has been tolerating the dressing changes this looks significantly improved compared to last week and very pleased. I do not see any signs of active infection locally or systemically at this time. KAMERIN, MANWILLER ([id number]) [id number] Page 10 of 12 08-31-2022 upon evaluation today patient appears to be doing well currently in regard to his wound this is actually showing signs of improvement and actually very pleased with where we stand today. Fortunately I do not see any signs of active infection which is great news. 09-14-2022 upon evaluation today patient appears to be doing well current regard to his wound with section showing signs of improvement would be using endoform which I think is really doing a great job here. Fortunately I do not see any signs of worsening overall. 09-21-2022 upon evaluation today patient appears to be doing well  currently in regard to his wound. He has been tolerating the dressing changes without complication. Fortunately there does not appear to be any signs of active infection at this time which is good news. Objective Constitutional Obese and well-hydrated in no acute distress. Vitals  Time Taken: 2:54 PM, Height: 71 in, Weight: 350 lbs, BMI: 48.8, Temperature: 98.5 F, Pulse: 71 bpm, Respiratory Rate: 18 breaths/min, Blood Pressure: 103/70 mmHg. Respiratory normal breathing without difficulty. Psychiatric this patient is able to make decisions and demonstrates good insight into disease process. Alert and Oriented x 3. pleasant and cooperative. General Notes: Upon inspection patient's wound bed actually showed evidence of some improvement is still very slow. With that being said I feel like we will get back on track after his vacation things are gone downhill just a bit and I think we are back kind of where we were prior to that vacation time. With that being said he still has the small area out to the side which makes this measured a little bit larger. Nonetheless I do believe that I would like to give 1 more week with the endoform and see how this does if he is not really showing signs of dramatic improvement then we will likely switch over to Aquacel to see how that we will do. Integumentary (Hair, Skin) Wound #7 status is Open. Original cause of wound was Blister. The date acquired was: 07/21/2021. The wound has been in treatment 59 weeks. The wound is located on the Right,Medial Malleolus. The wound measures 4cm length x 1.7cm width x 0.3cm depth; 5.341cm^2 area and 1.602cm^3 volume. There is Fat Layer (Subcutaneous Tissue) exposed. There is no tunneling or undermining noted. There is a medium amount of serosanguineous drainage noted. There is large (67-100%) red granulation within the wound bed. There is a small (1-33%) amount of necrotic tissue within the wound bed. The periwound skin  appearance did not exhibit: Callus, Crepitus, Excoriation, Induration, Rash, Scarring, Dry/Scaly, Maceration, Atrophie Blanche, Cyanosis, Ecchymosis, Hemosiderin Staining, Mottled, Pallor, Rubor, Erythema. Periwound temperature was noted as No Abnormality. 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 There was a Double Layer Compression Therapy Procedure by Shawn Stall, RN. Post procedure Diagnosis Wound #7: Same as Pre-Procedure Plan Follow-up Appointments: Return Appointment in 1 week. Leonard Schwartz Wednesday 09/21/2022 (already scheduled) Return Appointment in 2 weeks. Leonard Schwartz Wednesday 09/28/2022 (already scheduled) Return appointment in 3 weeks. Leonard Schwartz Wednesday 10/05/2022 (already scheduled) Anesthetic: KNOXTON, ANSLEY D ([id number]) [id number] Page 11 of 12 (In clinic) Topical Lidocaine 5% applied to wound bed - Used in Clinic Prior to debridement (In clinic) Topical Lidocaine 4% applied to wound bed Cellular or Tissue Based Products: Cellular or Tissue Based Product Type: - Run IVR for Kerrecis- denied #1 Apligraf applied 02/23/2022 #2 Apligraf applied 03/02/2022 #3 Apligraf applied 03/16/2022 #4 Apligraf applied 03/30/2022 HOLD APLIGRAF this week. Cellular or Tissue Based Product applied to wound bed, secured with steri-strips, cover with Adaptic or Mepitel. (DO NOT REMOVE). Bathing/ Shower/ Hygiene: May shower with protection but  do not get wound dressing(s) wet. Protect dressing(s) with water repellant cover (for example, large plastic bag) or a cast cover and may then take shower. Edema Control - Lymphedema / SCD / Other: Elevate legs to the level of the heart or above for 30 minutes daily  and/or when sitting for 3-4 times a day throughout the day. Avoid standing for long periods of time. Patient to wear own compression stockings every day. - apply in the morning and remove at night left leg. Exercise regularly Moisturize legs daily. - apply every night before bed. Compression stocking or Garment 30-40 mm/Hg pressure to: - wear your VIVE compression stocking to left leg. apply in the morning and remove at night. WOUND #7: - Malleolus Wound Laterality: Right, Medial Cleanser: Soap and Water 1 x Per Week/30 Days Discharge Instructions: May shower and wash wound with dial antibacterial soap and water prior to dressing change. Cleanser: Vashe 5.8 (oz) 1 x Per Week/30 Days Discharge Instructions: Cleanse the wound with Vashe prior to applying a clean dressing using gauze sponges, not tissue or cotton balls. Peri-Wound Care: Sween Lotion (Moisturizing lotion) 1 x Per Week/30 Days Discharge Instructions: Apply moisturizing lotion as directed Topical: Gentamicin 1 x Per Week/30 Days Discharge Instructions: As directed by physician Prim Dressing: Endoform 2x2 in (Dispense As Written) 1 x Per Week/30 Days ary Discharge Instructions: Moisten with saline Secondary Dressing: CarboFLEX Odor Control Dressing, 4x4 in 1 x Per Week/30 Days Discharge Instructions: Apply over primary dressing as directed. Secondary Dressing: Drawtex 4x4 in 1 x Per Week/30 Days Discharge Instructions: Apply over primary dressing as directed. Secondary Dressing: Zetuvit Plus 4x8 in 1 x Per Week/30 Days Discharge Instructions: Apply over primary dressing as directed. Com pression Wrap: Urgo K2, (equivalent to a 4 layer) two layer compression system,  regular 1 x Per Week/30 Days Discharge Instructions: Apply Urgo K2 as directed (alternative to 4 layer compression). 1. I would recommend that we have the patient continue to monitor for any signs of infection or worsening. Based on what I am seeing I do believe there were making excellent headway towards closure although I would just now getting back to where we were prior to him going on vacation. 2. I am going to recommend as well that we have the patient use the endoform followed by the drawtex and carbo flex. 3. We will continue with the Urgo K2 compression wrap which I think is doing a good job. We will see patient back for reevaluation in 1 week here in the clinic. If anything worsens or changes patient will contact our office for additional recommendations. Electronic Signature(s) Signed: 09/21/2022 3:35:16 PM By: Allen Derry PA-C Entered By: Allen Derry on 09/21/2022 12:35:16 -------------------------------------------------------------------------------- SuperBill Details Patient Name: Date of Service: Ensey, [name] 09/21/2022 Medical Record Number: [id number] Patient Account Number: [id number] Date of Birth/Sex: Treating RN: 11/09/1955 (66 y.o. Harlon Flor, Millard.Loa Primary Care Provider: [name] Other Clinician: Referring Provider: Treating Provider/Extender: Alver Fisher in Treatment: 59 Diagnosis Coding ICD-10 Codes Code Description I89.0 Lymphedema, not elsewhere classified I87.333 Chronic venous hypertension (idiopathic) with ulcer and inflammation of bilateral lower extremity L97.828 Non-pressure chronic ulcer of other part of left lower leg with other specified severity L97.818 Non-pressure chronic ulcer of other part of right lower leg with other specified severity E11.621 Type 2 diabetes mellitus with foot ulcer L97.518 Non-pressure chronic ulcer of other part of right foot with other specified severity Facility Procedures BRAYCEN, SUTHERLAND  ([id number]): CPT4 Code Description [id number] (Facility Use Only) [phone] - APPLY MULTLAY COMPRS LWR RT LEG [phone].pdf Page 12 of 12: Modifier Quantity 1 Physician Procedures : CPT4 Code Description Modifier [id number] - WC PHYS LEVEL 3 - EST PT ICD-10 Diagnosis Description I89.0 Lymphedema, not elsewhere classified I87.333 Chronic  venous hypertension (idiopathic) with ulcer and inflammation of bilateral lower extremity  L97.828 Non-pressure chronic ulcer of other part of left lower leg with other specified severity L97.818 Non-pressure chronic ulcer of other part of right lower leg with other specified severity Quantity: 1 Electronic Signature(s) Signed: 09/21/2022 3:35:43 PM By: Allen Derry PA-C Entered By: Allen Derry on 09/21/2022 12:35:42

## 2022-09-22 NOTE — Progress Notes (Signed)
CHARVEZ, PRIVOTT ([id number]) [id number] Page 1 of 6 Visit Report for 09/21/2022 Arrival Information Details Patient Name: Date of Service: KIP, CALLWOOD 09/21/2022 2:45 PM Medical Record Number: [id number] Patient Account Number: [id number] Date of Birth/Sex: Treating RN: 02-10-1955 (67 y.o. M) Primary Care Franz Svec: [name] Other Clinician: Referring Eugune Sine: Treating Dulse Rutan/Extender: Alver Fisher in Treatment: 59 Visit Information History Since Last Visit Added or deleted any medications: No Patient Arrived: [name] Any new allergies or adverse reactions: No Arrival Time: 14:44 Had a fall or experienced change in No Accompanied By: self activities of daily living that may affect Transfer Assistance: None risk of falls: Patient Identification Verified: Yes Signs or symptoms of abuse/neglect since last visito No Secondary Verification Process Completed: Yes Hospitalized since last visit: No Patient Requires Transmission-Based Precautions: No Implantable device outside of the clinic excluding No Patient Has Alerts: Yes cellular tissue based products placed in the center Patient Alerts: Patient on Blood Thinner since last visit: 01/2021 ABI L 1.13 R 1.06 Has Dressing in Place as Prescribed: Yes 01/2021 TBI L amp R 0.8 Has Compression in Place as Prescribed: Yes Pain Present Now: No Electronic Signature(s) Signed: 09/21/2022 4:43:35 PM By: Thayer Dallas Entered By: Thayer Dallas on 09/21/2022 14:54:31 -------------------------------------------------------------------------------- Compression Therapy Details Patient Name: Date of Service: [name] 09/21/2022 2:45 PM Medical Record Number: [id number] Patient Account Number: [id number] Date of Birth/Sex: Treating RN: [birth year] (67 y.o. [name] Primary Care Tessia Kassin: [name] Other Clinician: Referring Aeon Kessner: Treating Laylee Schooley/Extender: Alver Fisher in Treatment: 59 Compression Therapy Performed for Wound Assessment: Wound #7 Right,Medial Malleolus Performed By: Clinician Shawn Stall, RN Compression Type: Double Layer Post Procedure Diagnosis Same as Pre-procedure Electronic Signature(s) Signed: 09/21/2022 5:54:20 PM By: Shawn Stall RN, BSN Entered By: Shawn Stall on 09/21/2022 15:29:16 Korb, Efstathios D ([id number]) [id number] Page 2 of 6 -------------------------------------------------------------------------------- Encounter Discharge Information Details Patient Name: Date of Service: BRACE, LETOURNEAU 09/21/2022 2:45 PM Medical Record Number: [id number] Patient Account Number: [id number] Date of Birth/Sex: Treating RN: 20-Aug-1955 (67 y.o. [name] Primary Care Akili Cuda: [name] Other Clinician: Referring Monserrat Vidaurri: Treating Rasheed Welty/Extender: Alver Fisher in Treatment: 64 Encounter Discharge Information Items Discharge Condition: Stable Ambulatory Status: Walker Discharge Destination: Home Transportation: Private Auto Accompanied By: self Schedule Follow-up Appointment: Yes Clinical Summary of Care: Electronic Signature(s) Signed: 09/21/2022 5:54:20 PM By: Shawn Stall RN, BSN Entered By: Shawn Stall on 09/21/2022 15:32:31 -------------------------------------------------------------------------------- Lower Extremity Assessment Details Patient Name: Date of Service: Margolis, [name] 09/21/2022 2:45 PM Medical Record Number: [id number] Patient Account Number: [id number] Date of Birth/Sex: Treating RN: April 27, 1955 (67 y.o. M) Primary Care Neal Oshea: [name] Other Clinician: Referring Karlton Maya: Treating Brailyn Killion/Extender: Alver Fisher in Treatment: 59 Edema Assessment Assessed: [Left: No] [Right: No] Edema: [Left: N] [Right: o] Calf Left: Right: Point of Measurement: 31 cm From Medial Instep 38.5  cm Ankle Left: Right: Point of Measurement: 11 cm From Medial Instep 25.5 cm Vascular Assessment Extremity colors, hair growth, and conditions: Extremity Color: [Right:Normal] Hair Growth on Extremity: [Right:No] Temperature of Extremity: [Right:Warm] Capillary Refill: [Right:< 3 seconds] Dependent Rubor: [Right:No Yes] Electronic Signature(s) Signed: 09/21/2022 4:43:35 PM By: Thayer Dallas Entered By: Thayer Dallas on 09/21/2022 15:00:31 Hagedorn, [name] ([id number]) [id number] Page 3 of 6 -------------------------------------------------------------------------------- Multi-Disciplinary Care Plan Details Patient Name: Date of Service: ISAIC, BACHER 09/21/2022 2:45 PM Medical Record Number: [id number] Patient Account Number: [id number] Date  of Birth/Sex: Treating RN: May 26, 1955 (67 y.o. [name] Primary Care Azeneth Carbonell: [name] Other Clinician: Referring Genea Rheaume: Treating Taina Landry/Extender: Alver Fisher in Treatment: 35 Multidisciplinary Care Plan reviewed with physician Active Inactive Pain, Acute or Chronic Nursing Diagnoses: Pain, acute or chronic: actual or potential Potential alteration in comfort, pain Goals: Patient will verbalize adequate pain control and receive pain control interventions during procedures as needed Date Initiated: 08/04/2021 Target Resolution Date: 01/10/2023 Goal Status: Active Patient/caregiver will verbalize comfort level met Date Initiated: 08/04/2021 Date Inactivated: 07/27/2022 Target Resolution Date: 09/10/2022 Goal Status: Met Interventions: Encourage patient to take pain medications as prescribed Provide education on pain management Reposition patient for comfort Treatment Activities: Administer pain control measures as ordered : 08/04/2021 Notes: Electronic Signature(s) Signed: 09/21/2022 5:54:20 PM By: Shawn Stall RN, BSN Entered By: Shawn Stall on 09/21/2022  14:46:34 -------------------------------------------------------------------------------- Pain Assessment Details Patient Name: Date of Service: Donnan, [name] 09/21/2022 2:45 PM Medical Record Number: [id number] Patient Account Number: [id number] Date of Birth/Sex: Treating RN: [birth year] (67 y.o. M) Primary Care Jodeci Roarty: [name] Other Clinician: Referring Torrey Horseman: Treating Anita Laguna/Extender: Alver Fisher in Treatment: 59 Active Problems Location of Pain Severity and Description of Pain Patient Has Paino No Site Locations [place], [place] D ([id number]) [id number] Page 4 of 6 Pain Management and Medication Current Pain Management: Electronic Signature(s) Signed: 09/21/2022 4:43:35 PM By: Thayer Dallas Entered By: Thayer Dallas on 09/21/2022 14:55:39 -------------------------------------------------------------------------------- Patient/Caregiver Education Details Patient Name: Date of Service: [name] 9/11/2024andnbsp2:45 PM Medical Record Number: [id number] Patient Account Number: [id number] Date of Birth/Gender: Treating RN: 1955-04-19 (67 y.o. [name] Primary Care Physician: [name] Other Clinician: Referring Physician: Treating Physician/Extender: Alver Fisher in Treatment: 74 Education Assessment Education Provided To: Patient Education Topics Provided Wound/Skin Impairment: Handouts: Caring for Your Ulcer Methods: Explain/Verbal Responses: Reinforcements needed Electronic Signature(s) Signed: 09/21/2022 5:54:20 PM By: Shawn Stall RN, BSN Entered By: Shawn Stall on 09/21/2022 14:47:36 -------------------------------------------------------------------------------- Wound Assessment Details Patient Name: Date of Service: Boutelle, [name] 09/21/2022 2:45 PM Medical Record Number: [id number] Patient Account Number: [id number] Date of Birth/Sex: Treating RN: 1955-08-20  (67 y.o. M) Primary Care Lendell Gallick: [name] Other Clinician: KENNET, ALWORTH D ([id number]) [id number] Page 5 of 6 Referring Evella Kasal: Treating [place] Mer[place]/Extender: Alver Fisher in Treatment: 59 Wound Status Wound Number: 7 Primary Venous Leg Ulcer Etiology: Wound Location: Right, Medial Malleolus Secondary Lymphedema Wounding Event: Blister Etiology: Date Acquired: 07/21/2021 Wound Open Weeks Of Treatment: 59 Status: Clustered Wound: No Comorbid Sleep Apnea, Arrhythmia, Congestive Heart Failure, History: Hypertension, Hypotension, Peripheral Arterial Disease, Peripheral Venous Disease, Type II Diabetes, Gout, Osteoarthritis Photos Wound Measurements Length: (cm) 4 Width: (cm) 1.7 Depth: (cm) 0.3 Area: (cm) 5.341 Volume: (cm) 1.602 % Reduction in Area: 86.1% % Reduction in Volume: 94% Epithelialization: Small (1-33%) Tunneling: No Undermining: No Wound Description Classification: Full Thickness With Exposed Supp Exudate Amount: Medium Exudate Type: Serosanguineous Exudate Color: red, brown ort Structures Foul Odor After Cleansing: No Slough/Fibrino Yes Wound Bed Granulation Amount: Large (67-100%) Exposed Structure Granulation Quality: Red Fascia Exposed: No Necrotic Amount: Small (1-33%) Fat Layer (Subcutaneous Tissue) Exposed: Yes Tendon Exposed: No Muscle Exposed: No Joint Exposed: No Bone Exposed: No Periwound Skin Texture Texture Color No Abnormalities Noted: No No Abnormalities Noted: No Callus: No Atrophie Blanche: No Crepitus: No Cyanosis: No Excoriation: No Ecchymosis: No Induration: No Erythema: No Rash: No Hemosiderin Staining: No Scarring: No Mottled: No Pallor: No  Moisture Rubor: No No Abnormalities Noted: No Dry / Scaly: No Temperature / Pain Maceration: No Temperature: No Abnormality Treatment Notes Wound #7 (Malleolus) Wound Laterality: Right, Medial Cleanser Soap and  Water Discharge Instruction: May shower and wash wound with dial antibacterial soap and water prior to dressing change. Vashe 5.8 (oz) Discharge Instruction: Cleanse the wound with Vashe prior to applying a clean dressing using gauze sponges, not tissue or cotton balls. GALAN, MANNOR ([id number]) [id number] Page 6 of 6 Peri-Wound Care Sween Lotion (Moisturizing lotion) Discharge Instruction: Apply moisturizing lotion as directed Topical Gentamicin Discharge Instruction: As directed by physician Primary Dressing Endoform 2x2 in Discharge Instruction: Moisten with saline Secondary Dressing CarboFLEX Odor Control Dressing, 4x4 in Discharge Instruction: Apply over primary dressing as directed. Drawtex 4x4 in Discharge Instruction: Apply over primary dressing as directed. Zetuvit Plus 4x8 in Discharge Instruction: Apply over primary dressing as directed. Secured With Compression Wrap Urgo K2, (equivalent to a 4 layer) two layer compression system, regular Discharge Instruction: Apply Urgo K2 as directed (alternative to 4 layer compression). Compression Stockings Add-Ons Electronic Signature(s) Signed: 09/21/2022 4:43:35 PM By: Thayer Dallas Entered By: Thayer Dallas on 09/21/2022 15:04:31 -------------------------------------------------------------------------------- Vitals Details Patient Name: Date of Service: [place], [name] 09/21/2022 2:45 PM Medical Record Number: [id number] Patient Account Number: [id number] Date of Birth/Sex: Treating RN: 1955-05-18 (67 y.o. M) Primary Care Kylea Berrong: [name] Other Clinician: Referring Tanielle Emigh: Treating Ibn Stief/Extender: Alver Fisher in Treatment: 59 Vital Signs Time Taken: 14:54 Temperature (F): 98.5 Height (in): 71 Pulse (bpm): 71 Weight (lbs): 350 Respiratory Rate (breaths/min): 18 Body Mass Index (BMI): 48.8 Blood Pressure (mmHg): 103/70 Reference Range: 80 - 120 mg /  dl Electronic Signature(s) Signed: 09/21/2022 4:43:35 PM By: Thayer Dallas Entered By: Thayer Dallas on 09/21/2022 14:55:10

## 2022-09-26 ENCOUNTER — Ambulatory Visit: Payer: Medicare Other | Admitting: Nurse Practitioner

## 2022-09-26 ENCOUNTER — Encounter: Payer: Self-pay | Admitting: Nurse Practitioner

## 2022-09-26 VITALS — BP 134/76 | HR 90 | Temp 98.2°F | Ht 71.0 in | Wt 333.2 lb

## 2022-09-26 DIAGNOSIS — I5022 Chronic systolic (congestive) heart failure: Secondary | ICD-10-CM | POA: Diagnosis not present

## 2022-09-26 DIAGNOSIS — J849 Interstitial pulmonary disease, unspecified: Secondary | ICD-10-CM | POA: Diagnosis not present

## 2022-09-26 DIAGNOSIS — J309 Allergic rhinitis, unspecified: Secondary | ICD-10-CM

## 2022-09-26 NOTE — Progress Notes (Signed)
@Patient  ID: [name], male    DOB: 02-15-1955, 67 y.o.   MRN: [id number]  Chief Complaint  Patient presents with   Follow-up    Discuss CT from 09/05/2022.  Doing well.    Referring provider: [name], MD  HPI: 67 year old male, former smoker followed for ILD.  He is a patient of Dr. Shirlee More and last seen in office 04/18/2022.  Past medical history significant for CHF, bradycardia s/p pacer/ICD, arthritis, A-fib, DM, diverticulosis, HLD, gout, hypertension, lymphoma, COVID-pneumonia 2020, pulmonary hypertension, tubular adenoma of colon, OSA on CPAP.  He was admitted in February 2019 with altered swallowing influenza requiring tracheostomy.  He was decannulated in March 2019.   TEST/EVENTS:  03/01/2021 LDCT chest lung cancer screen: Atherosclerosis/CAD.  Several prominent borderline enlarged lymph nodes in the mediastinum.  Mildly enlarged left hilar node.  Widespread but patchy areas of thickening of peribronchovascular interstitium with regional architectural distortion and per bronchovascular groundglass attenuation.  Some septal thickening, mid to upper lobes.  Findings are new compared to prior study.  Lung RADS 1S 09/27/2021 PFT FVC 65, FEV1 75, ratio 73, TLC 68, DLCO 60.  No BD moderate restrictive lung disease with moderate diffusing defect 11/11/2021 HRCT chest: Atherosclerosis/CAD. ILD with spectrum of findings considered most compatible with alternative diagnosis.  Favored to represent probable chronic hypersensitivity pneumonitis or NSIP.  Hepatic steatosis.  Small left adrenal adenoma.  Cholelithiasis. 09/05/2022 HRCT chest: Stable mild cardiomegaly.  Atherosclerosis/CAD.  Mildly enlarged AP window node, 1 cm from 1.3 cm on prior study.  Moderate patchy air trapping.  Moderate patchy bronchovascular groundglass opacity and mild scattered reticulation in both lungs.  Minimal architectural distortion.  Minimal traction bronchiectasis.  No clear apicobasilar gradient.  No  appreciable interval progression.  Suggestive of alternative diagnosis.  04/18/2022: OV with Dr. Isaiah Serge.  Initially referred for ILD noted on screening CT of the chest.  Vague changes of upper lobe predominant groundglass opacification interstitial thickening, not very evident from prior CT scans.  Did not want to undergo lung biopsy or bronchoscopy for BAL.  Has chronic dyspnea on exertion, which got better after pacer/ICD and weight loss.  Works as a [employer]ist.  Exposure to some hairsprays at work.  Had down pillows for many years.  Got rid of his down pillow in October 2023.  Given prednisone course for a month which she completed.  He also was noted to have volume overload and was placed on aggressive diuresis and lost 30 pounds with improvement dyspnea.  Plan for repeat CT chest in 4 months.  If there is progression in spite of allergen avoidance and steroids, will reconsider biopsy.   09/26/2022: Today - follow up Patient presents today for follow up. He had a CT chest last month that showed stable changes of ILD without any evidence of progression since 11/2021. He is feeling about the same as his last visit. No recent flares requiring steroids. He mostly has trouble with his breathing when he has fluid overload, which he identifies with leg swelling or increased weight. Typically responds well to diuresis and feels better. He has a minimally productive cough in the AM with clear phlegm that is overall stable. Once he coughs up the phlegm after 3 coughs, he doesn't have any difficultiles the rest of the day. He does have some postnasal drip which could contribute. He does not have any chest congestion, fevers, chills, night sweats, weight loss, anorexia, orthopnea, PND. He does not have any inhalers.  Has used them in the past without any perceived benefit. He does walk with a walker; no shortness of breath on level ground. Wears his CPAP nightly; managed by his primary.   Allergies   Allergen Reactions   Latex Hives and Swelling   Ace Inhibitors     Other reaction(s): cough   Penicillin G     Other reaction(s): rash Has tolerated amoxicillin    Immunization History  Administered Date(s) Administered   DTaP, 5 pertussis antigens 12/10/2018   Influenza, Quadrivalent, Recombinant, Inj, Pf 10/30/2017, 09/28/2020   Influenza-Unspecified 10/26/2017, 12/10/2018   PFIZER SARS-COV-2 Pediatric Vaccination 5-38yrs 06/11/2019, 06/25/2019   Pneumococcal Polysaccharide-23 03/23/2017, 10/16/2020   Pneumococcal-Unspecified 11/16/2009   Td 06/16/2009   Tdap 04/23/2007   Zoster, Live 11/16/2009    Past Medical History:  Diagnosis Date   Acute systolic HF (heart failure) (HCC)    Arthritis    lt foot, hips knees    Atrial fibrillation (HCC)    Diabetes mellitus, new onset (HCC)    Diverticulosis    Dyslipidemia    Gout attack 01/16/2012   Hypertension    Internal hemorrhoids    Lymphoma (HCC)    remission for about 2 years, chemo 3 years prior   Pneumonia due to COVID-19 virus 07/2018   Pulmonary hypertension (HCC)    Sepsis (HCC) 02/2017   secondary to influenza; requiring trach   Tubular adenoma of colon    Venous stasis ulcers (HCC)     Tobacco History: Social History   Tobacco Use  Smoking Status Former   Current packs/day: 0.00   Average packs/day: 0.5 packs/day for 20.0 years (10.0 ttl pk-yrs)   Types: Cigarettes   Start date: 01/10/1989   Quit date: 01/10/2009   Years since quitting: 13.7  Smokeless Tobacco Never   Counseling given: Not Answered   Outpatient Medications Prior to Visit  Medication Sig Dispense Refill   acetaminophen (TYLENOL) 325 MG tablet Take 650 mg by mouth every 6 (six) hours as needed for moderate pain.      apixaban (ELIQUIS) 5 MG TABS tablet Take 1 tablet (5 mg total) by mouth 2 (two) times daily. 180 tablet 1   ascorbic acid (VITAMIN C) 500 MG tablet Take 1 tablet (500 mg total) by mouth 3 (three) times daily. (Patient  taking differently: Take 500 mg by mouth 2 (two) times daily.) 30 tablet 0   atorvastatin (LIPITOR) 40 MG tablet TAKE 1 TABLET BY MOUTH EVERY DAY 30 tablet 11   carvedilol (COREG) 3.125 MG tablet Take 1 tablet (3.125 mg total) by mouth 2 (two) times daily. 180 tablet 3   cholecalciferol (VITAMIN D) 25 MCG (1000 UNIT) tablet Take 1 tablet (1,000 Units total) by mouth daily. 30 tablet 0   colchicine 0.6 MG tablet Take 0.6 mg by mouth 2 (two) times daily as needed.     [facility] ASPIRIN ADULT LOW DOSE 81 MG chewable tablet CHEW 1 TABLET BY MOUTH DAILY. 108 tablet 3   dapagliflozin propanediol (FARXIGA) 10 MG TABS tablet Take 1 tablet (10 mg total) by mouth daily. 90 tablet 1   Omega 3 340 MG CPDR Take 1 capsule (340 mg total) by mouth 2 (two) times daily. (Patient taking differently: Take 340 mg by mouth daily.) 60 capsule 0   omeprazole (PRILOSEC) 40 MG capsule Take 1 capsule (40 mg total) by mouth daily. 30 capsule 0   potassium chloride SA (KLOR-CON M) 10 MEQ tablet Take 1 tablet (10 mEq total) by mouth daily.  30 tablet 6   sacubitril-valsartan (ENTRESTO) 97-103 MG Take 1 tablet by mouth 2 (two) times daily. 60 tablet 11   sertraline (ZOLOFT) 50 MG tablet Take 1 tablet (50 mg total) by mouth daily. 30 tablet 0   spironolactone (ALDACTONE) 25 MG tablet Take 1 tablet (25 mg total) by mouth daily. 30 tablet 11   torsemide (DEMADEX) 10 MG tablet Take 1 tablet (10 mg total) by mouth daily. 30 tablet 6   vitamin B-12 (CYANOCOBALAMIN) 1000 MCG tablet Take 1 tablet (1,000 mcg total) by mouth daily. 30 tablet 0   No facility-administered medications prior to visit.     Review of Systems:   Constitutional: No weight loss or gain, night sweats, fevers, chills, or lassitude. +fatigue (baseline) HEENT: No headaches, difficulty swallowing, tooth/dental problems, or sore throat. No sneezing, itching, ear ache. +chronic nasal congestion, post nasal drip CV:  No chest pain, orthopnea, PND, swelling in lower  extremities, anasarca, dizziness, palpitations, syncope Resp: +baseline shortness of breath with exertion; AM cough. No excess mucus or change in color of mucus. No hemoptysis. No wheezing.  No chest wall deformity GI:  No heartburn, indigestion GU: No dysuria, change in color of urine, urgency or frequency.   Skin: No rash, lesions, ulcerations MSK:  No increased joint pain or swelling.   Neuro: No dizziness or lightheadedness.  Psych: No depression or anxiety. Mood stable.     Physical Exam:  BP 134/76 (BP Location: Right Arm, Patient Position: Sitting, Cuff Size: Large)   Pulse 90   Temp 98.2 F (36.8 C) (Oral)   Ht 5\' 11"  (1.803 m)   Wt (!) 333 lb 3.2 oz (151.1 kg)   SpO2 100%   BMI 46.47 kg/m   GEN: Pleasant, interactive, well-kempt; morbidly obese; in no acute distress. HEENT:  Normocephalic and atraumatic. PERRLA. Sclera white. Nasal turbinates pink, moist and patent bilaterally. No rhinorrhea present. Oropharynx pink and moist, without exudate or edema. No lesions, ulcerations, or postnasal drip.  NECK:  Supple w/ fair ROM. No JVD present. Thyroid symmetrical with no goiter or nodules palpated. No lymphadenopathy.   CV: RRR, no m/r/g, no peripheral edema. Pulses intact, +2 bilaterally. No cyanosis, pallor or clubbing. PULMONARY:  Unlabored, regular breathing. Clear bilaterally A&P w/o wheezes/rales/rhonchi. No accessory muscle use.  GI: BS present and normoactive. Soft, non-tender to palpation. No organomegaly or masses detected.  MSK: No erythema, warmth or tenderness. Cap refil <2 sec all extrem.   Neuro: A/Ox3. No focal deficits noted.   Skin: Warm, no lesions or rashe Psych: Normal affect and behavior. Judgement and thought content appropriate.     Lab Results:  CBC    Component Value Date/Time   WBC 6.3 03/21/2022 1302   WBC 6.8 09/27/2021 1517   RBC 4.49 03/21/2022 1302   HGB 13.8 03/21/2022 1302   HGB 12.6 (L) 08/02/2021 1150   HGB 14.5 10/24/2016 1440    HCT 42.5 03/21/2022 1302   HCT 39.0 08/02/2021 1150   HCT 43.8 10/24/2016 1440   PLT 124 (L) 03/21/2022 1302   PLT 170 08/02/2021 1150   MCV 94.7 03/21/2022 1302   MCV 91 08/02/2021 1150   MCV 92.4 10/24/2016 1440   MCH 30.7 03/21/2022 1302   MCHC 32.5 03/21/2022 1302   RDW 18.0 (H) 03/21/2022 1302   RDW 15.3 08/02/2021 1150   RDW 15.2 (H) 10/24/2016 1440   LYMPHSABS 0.9 03/21/2022 1302   LYMPHSABS 1.0 04/30/2019 0813   LYMPHSABS 1.7 10/24/2016 1440   MONOABS  0.7 03/21/2022 1302   MONOABS 0.9 10/24/2016 1440   EOSABS 0.1 03/21/2022 1302   EOSABS 0.1 04/30/2019 0813   BASOSABS 0.1 03/21/2022 1302   BASOSABS 0.1 04/30/2019 0813   BASOSABS 0.1 10/24/2016 1440    BMET    Component Value Date/Time   NA 133 (L) 05/02/2022 1503   NA 140 11/29/2021 1435   NA 137 10/24/2016 1440   K 5.5 (H) 05/02/2022 1503   K 3.6 10/24/2016 1440   CL 98 05/02/2022 1503   CL 105 05/14/2012 0840   CO2 23 05/02/2022 1503   CO2 26 10/24/2016 1440   GLUCOSE 106 (H) 05/02/2022 1503   GLUCOSE 94 10/24/2016 1440   GLUCOSE 102 (H) 05/14/2012 0840   BUN 56 (H) 05/02/2022 1503   BUN 20 11/29/2021 1435   BUN 16.2 10/24/2016 1440   CREATININE 1.49 (H) 05/02/2022 1503   CREATININE 0.87 03/21/2022 1302   CREATININE 0.8 10/24/2016 1440   CALCIUM 9.4 05/02/2022 1503   CALCIUM 9.2 10/24/2016 1440   GFRNONAA 51 (L) 05/02/2022 1503   GFRNONAA >60 03/21/2022 1302   GFRAA 106 11/11/2019 1506   GFRAA >60 01/07/2019 1458    BNP    Component Value Date/Time   BNP 586.5 (H) 05/02/2022 1503     Imaging:  CUP PACEART REMOTE DEVICE CHECK  Result Date: 09/19/2022 Scheduled remote reviewed. Normal device function.  There were 2 NSVT ararhythmias detected and one VT arrhythmia that was converted after one burst of ATP, sent to triage Next remote 91 days. ML, CVRS  CT CHEST HIGH RESOLUTION  Result Date: 09/12/2022 CLINICAL DATA:  Follow-up interstitial lung disease. History of non-Hodgkin lymphoma  previously treated with chemotherapy. Former smoker. * Tracking Code: BO * EXAM: CT CHEST WITHOUT CONTRAST TECHNIQUE: Multidetector CT imaging of the chest was performed following the standard protocol without intravenous contrast. High resolution imaging of the lungs, as well as inspiratory and expiratory imaging, was performed. RADIATION DOSE REDUCTION: This exam was performed according to the departmental dose-optimization program which includes automated exposure control, adjustment of the mA and/or kV according to patient size and/or use of iterative reconstruction technique. COMPARISON:  11/11/2021 high-resolution chest CT. FINDINGS: Cardiovascular: Stable mild cardiomegaly. Stable configuration of 2 lead left subclavian ICD with lead tips in right ventricle. No significant pericardial effusion/thickening. Three-vessel coronary atherosclerosis. Atherosclerotic nonaneurysmal thoracic aorta. Normal caliber pulmonary arteries. Mediastinum/Nodes: No significant thyroid nodules. Unremarkable esophagus. No axillary adenopathy. Mildly enlarged 1.0 cm AP window node (series 2/image 63), mildly decreased from 1.3 cm. No new pathologically enlarged mediastinal nodes. No discrete hilar adenopathy on these noncontrast images. Lungs/Pleura: No pneumothorax. No pleural effusion. Moderate patchy air trapping in both lungs on the expiration sequence, unchanged. No evidence of tracheobronchomalacia. No acute consolidative airspace disease, lung masses or significant pulmonary nodules. Moderate patchy peribronchovascular ground-glass opacity and mild scattered reticulation in throughout both lungs with associated minimal architectural distortion and minimal traction bronchiolectasis. No frank honeycombing. No clear apicobasilar gradient to these findings. No appreciable interval progression. Upper abdomen: No acute abnormality. Musculoskeletal: No aggressive appearing focal osseous lesions. Moderate thoracic spondylosis.  IMPRESSION: 1. Spectrum of findings compatible with fibrotic interstitial lung disease with moderate patchy air trapping indicative of small airways disease. No appreciable interval progression since 11/11/2021 high-resolution chest CT study. As previously described, leading differential considerations include chronic hypersensitivity pneumonitis or NSIP. Findings are suggestive of an alternative diagnosis (not UIP) per consensus guidelines: Diagnosis of Idiopathic Pulmonary Fibrosis: An Official ATS/ERS/JRS/ALAT Clinical Practice Guideline. Am J  Respir Crit Care Med Vol 198, Iss 5, [id number], Sep 10 2016. 2. Mild mediastinal lymphadenopathy, mildly decreased, most compatible with benign reactive adenopathy. 3. Stable mild cardiomegaly. Three-vessel coronary atherosclerosis. 4.  Aortic Atherosclerosis (ICD10-I70.0). Electronically Signed   By: Delbert Phenix M.D.   On: 09/12/2022 17:34    Administration History     None          Latest Ref Rng & Units 09/27/2021    3:17 PM 06/19/2017    2:56 PM  PFT Results  FVC-Pre L 3.01  3.87   FVC-Predicted Pre % 65  82   FVC-Post L 3.14  3.90   FVC-Predicted Post % 68  82   Pre FEV1/FVC % % 85  86   Post FEV1/FCV % % 73  86   FEV1-Pre L 2.55  3.32   FEV1-Predicted Pre % 75  93   FEV1-Post L 2.29  3.35   DLCO uncorrected ml/min/mmHg 16.04  16.92   DLCO UNC% % 60  52   DLCO corrected ml/min/mmHg 16.04  18.16   DLCO COR %Predicted % 60  56   DLVA Predicted % 89  74   TLC L 4.81  5.13   TLC % Predicted % 68  73   RV % Predicted % 68  49     No results found for: "NITRICOXIDE"      Assessment & Plan:   ILD (interstitial lung disease) (HCC) ILD - chronic HP vs NSIP; not UIP. Stable clinically and without interval progression on imaging since 11/2021. Will continue to monitor. He needs updated PFTs. Will plan to complete these at follow up. Encouraged him to work on graded exercises. Avoid triggers.   Patient Instructions  Continue torsemide and  spironolactone as directed by your heart doctor  Work on slowly increasing activity/exercise regimen Continue breathing exercises  CT chest was stable. We will likely repeat this in 6-12 months  Pulmonary function testing when you return  Follow up in 4 months after 30 minute PFT with Dr. Isaiah Serge. If symptoms worsen, please contact office for sooner follow up or seek emergency care.    Chronic systolic congestive heart failure (HCC) Euvolemic on exam. Compliant with diuretic regimen. Increased dyspnea seems to coincide with volume overload. Stable today. Follow up with cardiology as scheduled.   Allergic rhinitis Stable without flare in symptoms. Likely contributing to cough due to postnasal drainage. He will monitor and notify of any worsening.    I spent 32 minutes of dedicated to the care of this patient on the date of this encounter to include pre-visit review of records, face-to-face time with the patient discussing conditions above, post visit ordering of testing, clinical documentation with the electronic health record, making appropriate referrals as documented, and communicating necessary findings to members of the patients care team.  Noemi Chapel, NP 09/26/2022  Pt aware and understands NP's role.

## 2022-09-26 NOTE — Patient Instructions (Addendum)
Continue torsemide and spironolactone as directed by your heart doctor  Work on slowly increasing activity/exercise regimen Continue breathing exercises  CT chest was stable. We will likely repeat this in 6-12 months  Pulmonary function testing when you return  Follow up in 4 months after 30 minute PFT with Dr. Isaiah Serge. If symptoms worsen, please contact office for sooner follow up or seek emergency care.

## 2022-09-26 NOTE — Assessment & Plan Note (Addendum)
ILD - chronic HP vs NSIP; not UIP. Stable clinically and without interval progression on imaging since 11/2021. Will continue to monitor. He needs updated PFTs. Will plan to complete these at follow up. Encouraged him to work on graded exercises. Avoid triggers.   Patient Instructions  Continue torsemide and spironolactone as directed by your heart doctor  Work on slowly increasing activity/exercise regimen Continue breathing exercises  CT chest was stable. We will likely repeat this in 6-12 months  Pulmonary function testing when you return  Follow up in 4 months after 30 minute PFT with Dr. Isaiah Serge. If symptoms worsen, please contact office for sooner follow up or seek emergency care.

## 2022-09-26 NOTE — Assessment & Plan Note (Addendum)
Stable without flare in symptoms. Likely contributing to cough due to postnasal drainage. He will monitor and notify of any worsening.

## 2022-09-26 NOTE — Assessment & Plan Note (Signed)
Euvolemic on exam. Compliant with diuretic regimen. Increased dyspnea seems to coincide with volume overload. Stable today. Follow up with cardiology as scheduled.

## 2022-09-28 ENCOUNTER — Encounter (HOSPITAL_BASED_OUTPATIENT_CLINIC_OR_DEPARTMENT_OTHER): Payer: Medicare Other | Admitting: Physician Assistant

## 2022-09-28 DIAGNOSIS — I872 Venous insufficiency (chronic) (peripheral): Secondary | ICD-10-CM | POA: Diagnosis not present

## 2022-09-28 DIAGNOSIS — E1151 Type 2 diabetes mellitus with diabetic peripheral angiopathy without gangrene: Secondary | ICD-10-CM | POA: Diagnosis not present

## 2022-09-28 DIAGNOSIS — L97828 Non-pressure chronic ulcer of other part of left lower leg with other specified severity: Secondary | ICD-10-CM | POA: Diagnosis not present

## 2022-09-28 DIAGNOSIS — E11621 Type 2 diabetes mellitus with foot ulcer: Secondary | ICD-10-CM | POA: Diagnosis not present

## 2022-09-28 DIAGNOSIS — I110 Hypertensive heart disease with heart failure: Secondary | ICD-10-CM | POA: Diagnosis not present

## 2022-09-28 DIAGNOSIS — I890 Lymphedema, not elsewhere classified: Secondary | ICD-10-CM | POA: Diagnosis not present

## 2022-09-28 DIAGNOSIS — L97818 Non-pressure chronic ulcer of other part of right lower leg with other specified severity: Secondary | ICD-10-CM | POA: Diagnosis not present

## 2022-09-28 DIAGNOSIS — L97518 Non-pressure chronic ulcer of other part of right foot with other specified severity: Secondary | ICD-10-CM | POA: Diagnosis not present

## 2022-09-28 DIAGNOSIS — I2510 Atherosclerotic heart disease of native coronary artery without angina pectoris: Secondary | ICD-10-CM | POA: Diagnosis not present

## 2022-09-28 DIAGNOSIS — I509 Heart failure, unspecified: Secondary | ICD-10-CM | POA: Diagnosis not present

## 2022-09-28 DIAGNOSIS — Z6841 Body Mass Index (BMI) 40.0 and over, adult: Secondary | ICD-10-CM | POA: Diagnosis not present

## 2022-09-28 DIAGNOSIS — I87333 Chronic venous hypertension (idiopathic) with ulcer and inflammation of bilateral lower extremity: Secondary | ICD-10-CM | POA: Diagnosis not present

## 2022-09-28 DIAGNOSIS — L97312 Non-pressure chronic ulcer of right ankle with fat layer exposed: Secondary | ICD-10-CM | POA: Diagnosis not present

## 2022-09-28 NOTE — Telephone Encounter (Signed)
No change in treatment

## 2022-09-28 NOTE — Progress Notes (Signed)
K2 as directed (alternative to 4 layer compression). Electronic Signature(s) Signed: 09/28/2022 3:48:11 PM By: Dava Najjar, Dannielle Huh [name] ([id number]) [id number] Page 7 of 13 Signed: 09/28/2022 5:41:52 PM By: Shawn Stall RN, BSN Entered By: Shawn Stall on 09/28/2022  12:19:40 -------------------------------------------------------------------------------- Problem List Details Patient Name: Date of Service: [name], [name] 09/28/2022 2:45 PM Medical Record Number: [id number] Patient Account Number: [id number] Date of Birth/Sex: Treating RN: April 21, 1955 (67 y.o. M) Primary Care Provider: [name] Other Clinician: Referring Provider: Treating Provider/Extender: Alver Fisher in Treatment: 60 Active Problems ICD-10 Encounter Code Description Active Date MDM Diagnosis I89.0 Lymphedema, not elsewhere classified 08/04/2021 No Yes I87.333 Chronic venous hypertension (idiopathic) with ulcer and inflammation of 08/04/2021 No Yes bilateral lower extremity L97.828 Non-pressure chronic ulcer of other part of left lower leg with other specified 08/04/2021 No Yes severity L97.818 Non-pressure chronic ulcer of other part of right lower leg with other specified 08/04/2021 No Yes severity E11.621 Type 2 diabetes mellitus with foot ulcer 08/04/2021 No Yes L97.518 Non-pressure chronic ulcer of other part of right foot with other specified 08/04/2021 No Yes severity Inactive Problems Resolved Problems Electronic Signature(s) Signed: 09/28/2022 2:42:49 PM By: Allen Derry PA-C Entered By: Allen Derry on 09/28/2022 11:42:49 -------------------------------------------------------------------------------- Progress Note Details Patient Name: Date of Service: [name], [name] 09/28/2022 2:45 PM Medical Record Number: [id number] Patient Account Number: [id number] Date of Birth/Sex: Treating RN: May 05, 1955 (67 y.o. [name], [name] ([id number]) [id number] Page 8 of 13 Primary Care Provider: [name] Other Clinician: Referring Provider: Treating Provider/Extender: Alver Fisher in Treatment: 60 Subjective Chief Complaint Information obtained from Patient 08/04/2021; patient returns to clinic with  bilateral leg wounds as well as areas on the right foot History of Present Illness (HPI) ADMISSION 03/22/2021 This is a 67 year old man with a past medical history significant for diabetes type 2, congestive heart failure, peripheral arterial disease, morbid obesity, venous insufficiency, and coronary artery disease. He has been followed by Dr. Loreta Ave in podiatry, who performed a transmetatarsal amputation on the left foot in August 2022. He had issues healing that wound, but based upon Dr. Kenna Gilbert notes, ultimately the TMA wound healed. During his recovery from that surgery, however, ulcers opened up over the DIP joint of the right second and third toe. These have apparently closed and reopened multiple times. It sounds like one of the issues has been moisture accumulation and maceration of the tissues causing them to reopen. At his last visit with Dr. Loreta Ave, on March 01, 2021, there continues to be problems with moisture and he was referred to wound care for further evaluation and management. He had a formal aortogram with runoff performed prior to his TMA. The findings are copied here: Patient has inline flow to both feet with no significant flow-limiting lesion that would be amenable to percutaneous or open revascularization. He does have an element of small vessel disease and has a short segment occlusion of the distal anterior tibial/dorsalis pedis artery on the left foot but does have posterior tibial artery flow. Would recommend management of wounds with amputation of toes 2 and 3 on the right foot if the wounds do not heal and deteriorate. Transmetatarsal amputation on the left side has as good a blood supply as it is going to get and hopefully this will heal in the future. Formal ABIs were done in January 2023. They are normal bilaterally. ABI Findings: +---------+------------------+-----+----------+--------+ Right Rt Pressure (mmHg)IndexWaveform Comment   +---------+------------------+-----+----------+--------+ Brachial  K2 as directed (alternative to 4 layer compression). Electronic Signature(s) Signed: 09/28/2022 3:48:11 PM By: Dava Najjar, Dannielle Huh [name] ([id number]) [id number] Page 7 of 13 Signed: 09/28/2022 5:41:52 PM By: Shawn Stall RN, BSN Entered By: Shawn Stall on 09/28/2022  12:19:40 -------------------------------------------------------------------------------- Problem List Details Patient Name: Date of Service: [name], [name] 09/28/2022 2:45 PM Medical Record Number: [id number] Patient Account Number: [id number] Date of Birth/Sex: Treating RN: April 21, 1955 (67 y.o. M) Primary Care Provider: [name] Other Clinician: Referring Provider: Treating Provider/Extender: Alver Fisher in Treatment: 60 Active Problems ICD-10 Encounter Code Description Active Date MDM Diagnosis I89.0 Lymphedema, not elsewhere classified 08/04/2021 No Yes I87.333 Chronic venous hypertension (idiopathic) with ulcer and inflammation of 08/04/2021 No Yes bilateral lower extremity L97.828 Non-pressure chronic ulcer of other part of left lower leg with other specified 08/04/2021 No Yes severity L97.818 Non-pressure chronic ulcer of other part of right lower leg with other specified 08/04/2021 No Yes severity E11.621 Type 2 diabetes mellitus with foot ulcer 08/04/2021 No Yes L97.518 Non-pressure chronic ulcer of other part of right foot with other specified 08/04/2021 No Yes severity Inactive Problems Resolved Problems Electronic Signature(s) Signed: 09/28/2022 2:42:49 PM By: Allen Derry PA-C Entered By: Allen Derry on 09/28/2022 11:42:49 -------------------------------------------------------------------------------- Progress Note Details Patient Name: Date of Service: [name], [name] 09/28/2022 2:45 PM Medical Record Number: [id number] Patient Account Number: [id number] Date of Birth/Sex: Treating RN: May 05, 1955 (67 y.o. [name], [name] ([id number]) [id number] Page 8 of 13 Primary Care Provider: [name] Other Clinician: Referring Provider: Treating Provider/Extender: Alver Fisher in Treatment: 60 Subjective Chief Complaint Information obtained from Patient 08/04/2021; patient returns to clinic with  bilateral leg wounds as well as areas on the right foot History of Present Illness (HPI) ADMISSION 03/22/2021 This is a 67 year old man with a past medical history significant for diabetes type 2, congestive heart failure, peripheral arterial disease, morbid obesity, venous insufficiency, and coronary artery disease. He has been followed by Dr. Loreta Ave in podiatry, who performed a transmetatarsal amputation on the left foot in August 2022. He had issues healing that wound, but based upon Dr. Kenna Gilbert notes, ultimately the TMA wound healed. During his recovery from that surgery, however, ulcers opened up over the DIP joint of the right second and third toe. These have apparently closed and reopened multiple times. It sounds like one of the issues has been moisture accumulation and maceration of the tissues causing them to reopen. At his last visit with Dr. Loreta Ave, on March 01, 2021, there continues to be problems with moisture and he was referred to wound care for further evaluation and management. He had a formal aortogram with runoff performed prior to his TMA. The findings are copied here: Patient has inline flow to both feet with no significant flow-limiting lesion that would be amenable to percutaneous or open revascularization. He does have an element of small vessel disease and has a short segment occlusion of the distal anterior tibial/dorsalis pedis artery on the left foot but does have posterior tibial artery flow. Would recommend management of wounds with amputation of toes 2 and 3 on the right foot if the wounds do not heal and deteriorate. Transmetatarsal amputation on the left side has as good a blood supply as it is going to get and hopefully this will heal in the future. Formal ABIs were done in January 2023. They are normal bilaterally. ABI Findings: +---------+------------------+-----+----------+--------+ Right Rt Pressure (mmHg)IndexWaveform Comment   +---------+------------------+-----+----------+--------+ Brachial  K2 as directed (alternative to 4 layer compression). Electronic Signature(s) Signed: 09/28/2022 3:48:11 PM By: Dava Najjar, Dannielle Huh [name] ([id number]) [id number] Page 7 of 13 Signed: 09/28/2022 5:41:52 PM By: Shawn Stall RN, BSN Entered By: Shawn Stall on 09/28/2022  12:19:40 -------------------------------------------------------------------------------- Problem List Details Patient Name: Date of Service: [name], [name] 09/28/2022 2:45 PM Medical Record Number: [id number] Patient Account Number: [id number] Date of Birth/Sex: Treating RN: April 21, 1955 (67 y.o. M) Primary Care Provider: [name] Other Clinician: Referring Provider: Treating Provider/Extender: Alver Fisher in Treatment: 60 Active Problems ICD-10 Encounter Code Description Active Date MDM Diagnosis I89.0 Lymphedema, not elsewhere classified 08/04/2021 No Yes I87.333 Chronic venous hypertension (idiopathic) with ulcer and inflammation of 08/04/2021 No Yes bilateral lower extremity L97.828 Non-pressure chronic ulcer of other part of left lower leg with other specified 08/04/2021 No Yes severity L97.818 Non-pressure chronic ulcer of other part of right lower leg with other specified 08/04/2021 No Yes severity E11.621 Type 2 diabetes mellitus with foot ulcer 08/04/2021 No Yes L97.518 Non-pressure chronic ulcer of other part of right foot with other specified 08/04/2021 No Yes severity Inactive Problems Resolved Problems Electronic Signature(s) Signed: 09/28/2022 2:42:49 PM By: Allen Derry PA-C Entered By: Allen Derry on 09/28/2022 11:42:49 -------------------------------------------------------------------------------- Progress Note Details Patient Name: Date of Service: [name], [name] 09/28/2022 2:45 PM Medical Record Number: [id number] Patient Account Number: [id number] Date of Birth/Sex: Treating RN: May 05, 1955 (67 y.o. [name], [name] ([id number]) [id number] Page 8 of 13 Primary Care Provider: [name] Other Clinician: Referring Provider: Treating Provider/Extender: Alver Fisher in Treatment: 60 Subjective Chief Complaint Information obtained from Patient 08/04/2021; patient returns to clinic with  bilateral leg wounds as well as areas on the right foot History of Present Illness (HPI) ADMISSION 03/22/2021 This is a 67 year old man with a past medical history significant for diabetes type 2, congestive heart failure, peripheral arterial disease, morbid obesity, venous insufficiency, and coronary artery disease. He has been followed by Dr. Loreta Ave in podiatry, who performed a transmetatarsal amputation on the left foot in August 2022. He had issues healing that wound, but based upon Dr. Kenna Gilbert notes, ultimately the TMA wound healed. During his recovery from that surgery, however, ulcers opened up over the DIP joint of the right second and third toe. These have apparently closed and reopened multiple times. It sounds like one of the issues has been moisture accumulation and maceration of the tissues causing them to reopen. At his last visit with Dr. Loreta Ave, on March 01, 2021, there continues to be problems with moisture and he was referred to wound care for further evaluation and management. He had a formal aortogram with runoff performed prior to his TMA. The findings are copied here: Patient has inline flow to both feet with no significant flow-limiting lesion that would be amenable to percutaneous or open revascularization. He does have an element of small vessel disease and has a short segment occlusion of the distal anterior tibial/dorsalis pedis artery on the left foot but does have posterior tibial artery flow. Would recommend management of wounds with amputation of toes 2 and 3 on the right foot if the wounds do not heal and deteriorate. Transmetatarsal amputation on the left side has as good a blood supply as it is going to get and hopefully this will heal in the future. Formal ABIs were done in January 2023. They are normal bilaterally. ABI Findings: +---------+------------------+-----+----------+--------+ Right Rt Pressure (mmHg)IndexWaveform Comment   +---------+------------------+-----+----------+--------+ Brachial  tolerating the dressing changes without complication. Fortunately there does not appear to be any signs of infection. Fortunately there does not appear to be any signs of infection 08-10-2022 upon evaluation today patient appears to be doing okay in regard to his wound. He actually has been gone for 2 weeks and in the interim [name], [name] ([id number]) [id number] Page 5 of 13 unfortunately he did not have result out of the Tegaderm as well as  hoping for but nonetheless took good care of his wound. Does not appear to be infected I do think we need to go back to the compression wrap however which I think would definitely benefit him. Fortunately I do not see any signs of active infection at this time which is great news. 08-17-2022 upon evaluation today patient unfortunately appears to be showing signs of infection based on what I see at this time. Fortunately I do not see any evidence of systemic infection though locally I do definitely see some changes that are concerning. I do not think that this is systemic at all. 08-24-2022 upon evaluation today patient appears to be doing well currently in regard to his wound. He has been tolerating the dressing changes this looks significantly improved compared to last week and very pleased. I do not see any signs of active infection locally or systemically at this time. 08-31-2022 upon evaluation today patient appears to be doing well currently in regard to his wound this is actually showing signs of improvement and actually very pleased with where we stand today. Fortunately I do not see any signs of active infection which is great news. 09-14-2022 upon evaluation today patient appears to be doing well current regard to his wound with section showing signs of improvement would be using endoform which I think is really doing a great job here. Fortunately I do not see any signs of worsening overall. 09-21-2022 upon evaluation today patient appears to be doing well currently in regard to his wound. He has been tolerating the dressing changes without complication. Fortunately there does not appear to be any signs of active infection at this time which is good news. 09-28-2022 upon evaluation today patient appears to be doing well currently in regard to his wound which is actually measuring quite a bit smaller. This looks to be doing well and very pleased with where things stand today I do not see any signs of  active infection locally or systemically which is great news. No fevers, chills, nausea, vomiting, or diarrhea. Electronic Signature(s) Signed: 09/28/2022 3:22:40 PM By: Allen Derry PA-C Entered By: Allen Derry on 09/28/2022 12:22:40 -------------------------------------------------------------------------------- Physical Exam Details Patient Name: Date of Service: [name], [name] 09/28/2022 2:45 PM Medical Record Number: [id number] Patient Account Number: [id number] Date of Birth/Sex: Treating RN: Nov 08, 1955 (67 y.o. M) Primary Care Provider: [name] Other Clinician: Referring Provider: Treating Provider/Extender: Alver Fisher in Treatment: 60 Constitutional Obese and well-hydrated in no acute distress. Respiratory normal breathing without difficulty. Psychiatric this patient is able to make decisions and demonstrates good insight into disease process. Alert and Oriented x 3. pleasant and cooperative. Notes Upon inspection patient's wound bed actually showed signs of good granulation epithelization at this point. I do believe that he is going require some sharp debridement postdebridement the wound bed appears to be doing much better which is great news and in general I do think that we are making headway towards complete closure. Electronic Signature(s) Signed: 09/28/2022 3:23:04 PM By: Allen Derry PA-C  K2 as directed (alternative to 4 layer compression). Electronic Signature(s) Signed: 09/28/2022 3:48:11 PM By: Dava Najjar, Dannielle Huh [name] ([id number]) [id number] Page 7 of 13 Signed: 09/28/2022 5:41:52 PM By: Shawn Stall RN, BSN Entered By: Shawn Stall on 09/28/2022  12:19:40 -------------------------------------------------------------------------------- Problem List Details Patient Name: Date of Service: [name], [name] 09/28/2022 2:45 PM Medical Record Number: [id number] Patient Account Number: [id number] Date of Birth/Sex: Treating RN: April 21, 1955 (67 y.o. M) Primary Care Provider: [name] Other Clinician: Referring Provider: Treating Provider/Extender: Alver Fisher in Treatment: 60 Active Problems ICD-10 Encounter Code Description Active Date MDM Diagnosis I89.0 Lymphedema, not elsewhere classified 08/04/2021 No Yes I87.333 Chronic venous hypertension (idiopathic) with ulcer and inflammation of 08/04/2021 No Yes bilateral lower extremity L97.828 Non-pressure chronic ulcer of other part of left lower leg with other specified 08/04/2021 No Yes severity L97.818 Non-pressure chronic ulcer of other part of right lower leg with other specified 08/04/2021 No Yes severity E11.621 Type 2 diabetes mellitus with foot ulcer 08/04/2021 No Yes L97.518 Non-pressure chronic ulcer of other part of right foot with other specified 08/04/2021 No Yes severity Inactive Problems Resolved Problems Electronic Signature(s) Signed: 09/28/2022 2:42:49 PM By: Allen Derry PA-C Entered By: Allen Derry on 09/28/2022 11:42:49 -------------------------------------------------------------------------------- Progress Note Details Patient Name: Date of Service: [name], [name] 09/28/2022 2:45 PM Medical Record Number: [id number] Patient Account Number: [id number] Date of Birth/Sex: Treating RN: May 05, 1955 (67 y.o. [name], [name] ([id number]) [id number] Page 8 of 13 Primary Care Provider: [name] Other Clinician: Referring Provider: Treating Provider/Extender: Alver Fisher in Treatment: 60 Subjective Chief Complaint Information obtained from Patient 08/04/2021; patient returns to clinic with  bilateral leg wounds as well as areas on the right foot History of Present Illness (HPI) ADMISSION 03/22/2021 This is a 67 year old man with a past medical history significant for diabetes type 2, congestive heart failure, peripheral arterial disease, morbid obesity, venous insufficiency, and coronary artery disease. He has been followed by Dr. Loreta Ave in podiatry, who performed a transmetatarsal amputation on the left foot in August 2022. He had issues healing that wound, but based upon Dr. Kenna Gilbert notes, ultimately the TMA wound healed. During his recovery from that surgery, however, ulcers opened up over the DIP joint of the right second and third toe. These have apparently closed and reopened multiple times. It sounds like one of the issues has been moisture accumulation and maceration of the tissues causing them to reopen. At his last visit with Dr. Loreta Ave, on March 01, 2021, there continues to be problems with moisture and he was referred to wound care for further evaluation and management. He had a formal aortogram with runoff performed prior to his TMA. The findings are copied here: Patient has inline flow to both feet with no significant flow-limiting lesion that would be amenable to percutaneous or open revascularization. He does have an element of small vessel disease and has a short segment occlusion of the distal anterior tibial/dorsalis pedis artery on the left foot but does have posterior tibial artery flow. Would recommend management of wounds with amputation of toes 2 and 3 on the right foot if the wounds do not heal and deteriorate. Transmetatarsal amputation on the left side has as good a blood supply as it is going to get and hopefully this will heal in the future. Formal ABIs were done in January 2023. They are normal bilaterally. ABI Findings: +---------+------------------+-----+----------+--------+ Right Rt Pressure (mmHg)IndexWaveform Comment   +---------+------------------+-----+----------+--------+ Brachial  K2 as directed (alternative to 4 layer compression). Electronic Signature(s) Signed: 09/28/2022 3:48:11 PM By: Dava Najjar, Dannielle Huh [name] ([id number]) [id number] Page 7 of 13 Signed: 09/28/2022 5:41:52 PM By: Shawn Stall RN, BSN Entered By: Shawn Stall on 09/28/2022  12:19:40 -------------------------------------------------------------------------------- Problem List Details Patient Name: Date of Service: [name], [name] 09/28/2022 2:45 PM Medical Record Number: [id number] Patient Account Number: [id number] Date of Birth/Sex: Treating RN: April 21, 1955 (67 y.o. M) Primary Care Provider: [name] Other Clinician: Referring Provider: Treating Provider/Extender: Alver Fisher in Treatment: 60 Active Problems ICD-10 Encounter Code Description Active Date MDM Diagnosis I89.0 Lymphedema, not elsewhere classified 08/04/2021 No Yes I87.333 Chronic venous hypertension (idiopathic) with ulcer and inflammation of 08/04/2021 No Yes bilateral lower extremity L97.828 Non-pressure chronic ulcer of other part of left lower leg with other specified 08/04/2021 No Yes severity L97.818 Non-pressure chronic ulcer of other part of right lower leg with other specified 08/04/2021 No Yes severity E11.621 Type 2 diabetes mellitus with foot ulcer 08/04/2021 No Yes L97.518 Non-pressure chronic ulcer of other part of right foot with other specified 08/04/2021 No Yes severity Inactive Problems Resolved Problems Electronic Signature(s) Signed: 09/28/2022 2:42:49 PM By: Allen Derry PA-C Entered By: Allen Derry on 09/28/2022 11:42:49 -------------------------------------------------------------------------------- Progress Note Details Patient Name: Date of Service: [name], [name] 09/28/2022 2:45 PM Medical Record Number: [id number] Patient Account Number: [id number] Date of Birth/Sex: Treating RN: May 05, 1955 (67 y.o. [name], [name] ([id number]) [id number] Page 8 of 13 Primary Care Provider: [name] Other Clinician: Referring Provider: Treating Provider/Extender: Alver Fisher in Treatment: 60 Subjective Chief Complaint Information obtained from Patient 08/04/2021; patient returns to clinic with  bilateral leg wounds as well as areas on the right foot History of Present Illness (HPI) ADMISSION 03/22/2021 This is a 67 year old man with a past medical history significant for diabetes type 2, congestive heart failure, peripheral arterial disease, morbid obesity, venous insufficiency, and coronary artery disease. He has been followed by Dr. Loreta Ave in podiatry, who performed a transmetatarsal amputation on the left foot in August 2022. He had issues healing that wound, but based upon Dr. Kenna Gilbert notes, ultimately the TMA wound healed. During his recovery from that surgery, however, ulcers opened up over the DIP joint of the right second and third toe. These have apparently closed and reopened multiple times. It sounds like one of the issues has been moisture accumulation and maceration of the tissues causing them to reopen. At his last visit with Dr. Loreta Ave, on March 01, 2021, there continues to be problems with moisture and he was referred to wound care for further evaluation and management. He had a formal aortogram with runoff performed prior to his TMA. The findings are copied here: Patient has inline flow to both feet with no significant flow-limiting lesion that would be amenable to percutaneous or open revascularization. He does have an element of small vessel disease and has a short segment occlusion of the distal anterior tibial/dorsalis pedis artery on the left foot but does have posterior tibial artery flow. Would recommend management of wounds with amputation of toes 2 and 3 on the right foot if the wounds do not heal and deteriorate. Transmetatarsal amputation on the left side has as good a blood supply as it is going to get and hopefully this will heal in the future. Formal ABIs were done in January 2023. They are normal bilaterally. ABI Findings: +---------+------------------+-----+----------+--------+ Right Rt Pressure (mmHg)IndexWaveform Comment   +---------+------------------+-----+----------+--------+ Brachial  K2 as directed (alternative to 4 layer compression). Electronic Signature(s) Signed: 09/28/2022 3:48:11 PM By: Dava Najjar, Dannielle Huh [name] ([id number]) [id number] Page 7 of 13 Signed: 09/28/2022 5:41:52 PM By: Shawn Stall RN, BSN Entered By: Shawn Stall on 09/28/2022  12:19:40 -------------------------------------------------------------------------------- Problem List Details Patient Name: Date of Service: [name], [name] 09/28/2022 2:45 PM Medical Record Number: [id number] Patient Account Number: [id number] Date of Birth/Sex: Treating RN: April 21, 1955 (67 y.o. M) Primary Care Provider: [name] Other Clinician: Referring Provider: Treating Provider/Extender: Alver Fisher in Treatment: 60 Active Problems ICD-10 Encounter Code Description Active Date MDM Diagnosis I89.0 Lymphedema, not elsewhere classified 08/04/2021 No Yes I87.333 Chronic venous hypertension (idiopathic) with ulcer and inflammation of 08/04/2021 No Yes bilateral lower extremity L97.828 Non-pressure chronic ulcer of other part of left lower leg with other specified 08/04/2021 No Yes severity L97.818 Non-pressure chronic ulcer of other part of right lower leg with other specified 08/04/2021 No Yes severity E11.621 Type 2 diabetes mellitus with foot ulcer 08/04/2021 No Yes L97.518 Non-pressure chronic ulcer of other part of right foot with other specified 08/04/2021 No Yes severity Inactive Problems Resolved Problems Electronic Signature(s) Signed: 09/28/2022 2:42:49 PM By: Allen Derry PA-C Entered By: Allen Derry on 09/28/2022 11:42:49 -------------------------------------------------------------------------------- Progress Note Details Patient Name: Date of Service: [name], [name] 09/28/2022 2:45 PM Medical Record Number: [id number] Patient Account Number: [id number] Date of Birth/Sex: Treating RN: May 05, 1955 (67 y.o. [name], [name] ([id number]) [id number] Page 8 of 13 Primary Care Provider: [name] Other Clinician: Referring Provider: Treating Provider/Extender: Alver Fisher in Treatment: 60 Subjective Chief Complaint Information obtained from Patient 08/04/2021; patient returns to clinic with  bilateral leg wounds as well as areas on the right foot History of Present Illness (HPI) ADMISSION 03/22/2021 This is a 67 year old man with a past medical history significant for diabetes type 2, congestive heart failure, peripheral arterial disease, morbid obesity, venous insufficiency, and coronary artery disease. He has been followed by Dr. Loreta Ave in podiatry, who performed a transmetatarsal amputation on the left foot in August 2022. He had issues healing that wound, but based upon Dr. Kenna Gilbert notes, ultimately the TMA wound healed. During his recovery from that surgery, however, ulcers opened up over the DIP joint of the right second and third toe. These have apparently closed and reopened multiple times. It sounds like one of the issues has been moisture accumulation and maceration of the tissues causing them to reopen. At his last visit with Dr. Loreta Ave, on March 01, 2021, there continues to be problems with moisture and he was referred to wound care for further evaluation and management. He had a formal aortogram with runoff performed prior to his TMA. The findings are copied here: Patient has inline flow to both feet with no significant flow-limiting lesion that would be amenable to percutaneous or open revascularization. He does have an element of small vessel disease and has a short segment occlusion of the distal anterior tibial/dorsalis pedis artery on the left foot but does have posterior tibial artery flow. Would recommend management of wounds with amputation of toes 2 and 3 on the right foot if the wounds do not heal and deteriorate. Transmetatarsal amputation on the left side has as good a blood supply as it is going to get and hopefully this will heal in the future. Formal ABIs were done in January 2023. They are normal bilaterally. ABI Findings: +---------+------------------+-----+----------+--------+ Right Rt Pressure (mmHg)IndexWaveform Comment   +---------+------------------+-----+----------+--------+ Brachial  K2 as directed (alternative to 4 layer compression). Electronic Signature(s) Signed: 09/28/2022 3:48:11 PM By: Dava Najjar, Dannielle Huh [name] ([id number]) [id number] Page 7 of 13 Signed: 09/28/2022 5:41:52 PM By: Shawn Stall RN, BSN Entered By: Shawn Stall on 09/28/2022  12:19:40 -------------------------------------------------------------------------------- Problem List Details Patient Name: Date of Service: [name], [name] 09/28/2022 2:45 PM Medical Record Number: [id number] Patient Account Number: [id number] Date of Birth/Sex: Treating RN: April 21, 1955 (67 y.o. M) Primary Care Provider: [name] Other Clinician: Referring Provider: Treating Provider/Extender: Alver Fisher in Treatment: 60 Active Problems ICD-10 Encounter Code Description Active Date MDM Diagnosis I89.0 Lymphedema, not elsewhere classified 08/04/2021 No Yes I87.333 Chronic venous hypertension (idiopathic) with ulcer and inflammation of 08/04/2021 No Yes bilateral lower extremity L97.828 Non-pressure chronic ulcer of other part of left lower leg with other specified 08/04/2021 No Yes severity L97.818 Non-pressure chronic ulcer of other part of right lower leg with other specified 08/04/2021 No Yes severity E11.621 Type 2 diabetes mellitus with foot ulcer 08/04/2021 No Yes L97.518 Non-pressure chronic ulcer of other part of right foot with other specified 08/04/2021 No Yes severity Inactive Problems Resolved Problems Electronic Signature(s) Signed: 09/28/2022 2:42:49 PM By: Allen Derry PA-C Entered By: Allen Derry on 09/28/2022 11:42:49 -------------------------------------------------------------------------------- Progress Note Details Patient Name: Date of Service: [name], [name] 09/28/2022 2:45 PM Medical Record Number: [id number] Patient Account Number: [id number] Date of Birth/Sex: Treating RN: May 05, 1955 (67 y.o. [name], [name] ([id number]) [id number] Page 8 of 13 Primary Care Provider: [name] Other Clinician: Referring Provider: Treating Provider/Extender: Alver Fisher in Treatment: 60 Subjective Chief Complaint Information obtained from Patient 08/04/2021; patient returns to clinic with  bilateral leg wounds as well as areas on the right foot History of Present Illness (HPI) ADMISSION 03/22/2021 This is a 67 year old man with a past medical history significant for diabetes type 2, congestive heart failure, peripheral arterial disease, morbid obesity, venous insufficiency, and coronary artery disease. He has been followed by Dr. Loreta Ave in podiatry, who performed a transmetatarsal amputation on the left foot in August 2022. He had issues healing that wound, but based upon Dr. Kenna Gilbert notes, ultimately the TMA wound healed. During his recovery from that surgery, however, ulcers opened up over the DIP joint of the right second and third toe. These have apparently closed and reopened multiple times. It sounds like one of the issues has been moisture accumulation and maceration of the tissues causing them to reopen. At his last visit with Dr. Loreta Ave, on March 01, 2021, there continues to be problems with moisture and he was referred to wound care for further evaluation and management. He had a formal aortogram with runoff performed prior to his TMA. The findings are copied here: Patient has inline flow to both feet with no significant flow-limiting lesion that would be amenable to percutaneous or open revascularization. He does have an element of small vessel disease and has a short segment occlusion of the distal anterior tibial/dorsalis pedis artery on the left foot but does have posterior tibial artery flow. Would recommend management of wounds with amputation of toes 2 and 3 on the right foot if the wounds do not heal and deteriorate. Transmetatarsal amputation on the left side has as good a blood supply as it is going to get and hopefully this will heal in the future. Formal ABIs were done in January 2023. They are normal bilaterally. ABI Findings: +---------+------------------+-----+----------+--------+ Right Rt Pressure (mmHg)IndexWaveform Comment   +---------+------------------+-----+----------+--------+ Brachial  K2 as directed (alternative to 4 layer compression). Electronic Signature(s) Signed: 09/28/2022 3:48:11 PM By: Dava Najjar, Dannielle Huh [name] ([id number]) [id number] Page 7 of 13 Signed: 09/28/2022 5:41:52 PM By: Shawn Stall RN, BSN Entered By: Shawn Stall on 09/28/2022  12:19:40 -------------------------------------------------------------------------------- Problem List Details Patient Name: Date of Service: [name], [name] 09/28/2022 2:45 PM Medical Record Number: [id number] Patient Account Number: [id number] Date of Birth/Sex: Treating RN: April 21, 1955 (67 y.o. M) Primary Care Provider: [name] Other Clinician: Referring Provider: Treating Provider/Extender: Alver Fisher in Treatment: 60 Active Problems ICD-10 Encounter Code Description Active Date MDM Diagnosis I89.0 Lymphedema, not elsewhere classified 08/04/2021 No Yes I87.333 Chronic venous hypertension (idiopathic) with ulcer and inflammation of 08/04/2021 No Yes bilateral lower extremity L97.828 Non-pressure chronic ulcer of other part of left lower leg with other specified 08/04/2021 No Yes severity L97.818 Non-pressure chronic ulcer of other part of right lower leg with other specified 08/04/2021 No Yes severity E11.621 Type 2 diabetes mellitus with foot ulcer 08/04/2021 No Yes L97.518 Non-pressure chronic ulcer of other part of right foot with other specified 08/04/2021 No Yes severity Inactive Problems Resolved Problems Electronic Signature(s) Signed: 09/28/2022 2:42:49 PM By: Allen Derry PA-C Entered By: Allen Derry on 09/28/2022 11:42:49 -------------------------------------------------------------------------------- Progress Note Details Patient Name: Date of Service: [name], [name] 09/28/2022 2:45 PM Medical Record Number: [id number] Patient Account Number: [id number] Date of Birth/Sex: Treating RN: May 05, 1955 (67 y.o. [name], [name] ([id number]) [id number] Page 8 of 13 Primary Care Provider: [name] Other Clinician: Referring Provider: Treating Provider/Extender: Alver Fisher in Treatment: 60 Subjective Chief Complaint Information obtained from Patient 08/04/2021; patient returns to clinic with  bilateral leg wounds as well as areas on the right foot History of Present Illness (HPI) ADMISSION 03/22/2021 This is a 67 year old man with a past medical history significant for diabetes type 2, congestive heart failure, peripheral arterial disease, morbid obesity, venous insufficiency, and coronary artery disease. He has been followed by Dr. Loreta Ave in podiatry, who performed a transmetatarsal amputation on the left foot in August 2022. He had issues healing that wound, but based upon Dr. Kenna Gilbert notes, ultimately the TMA wound healed. During his recovery from that surgery, however, ulcers opened up over the DIP joint of the right second and third toe. These have apparently closed and reopened multiple times. It sounds like one of the issues has been moisture accumulation and maceration of the tissues causing them to reopen. At his last visit with Dr. Loreta Ave, on March 01, 2021, there continues to be problems with moisture and he was referred to wound care for further evaluation and management. He had a formal aortogram with runoff performed prior to his TMA. The findings are copied here: Patient has inline flow to both feet with no significant flow-limiting lesion that would be amenable to percutaneous or open revascularization. He does have an element of small vessel disease and has a short segment occlusion of the distal anterior tibial/dorsalis pedis artery on the left foot but does have posterior tibial artery flow. Would recommend management of wounds with amputation of toes 2 and 3 on the right foot if the wounds do not heal and deteriorate. Transmetatarsal amputation on the left side has as good a blood supply as it is going to get and hopefully this will heal in the future. Formal ABIs were done in January 2023. They are normal bilaterally. ABI Findings: +---------+------------------+-----+----------+--------+ Right Rt Pressure (mmHg)IndexWaveform Comment   +---------+------------------+-----+----------+--------+ Brachial  tolerating the dressing changes without complication. Fortunately there does not appear to be any signs of infection. Fortunately there does not appear to be any signs of infection 08-10-2022 upon evaluation today patient appears to be doing okay in regard to his wound. He actually has been gone for 2 weeks and in the interim [name], [name] ([id number]) [id number] Page 5 of 13 unfortunately he did not have result out of the Tegaderm as well as  hoping for but nonetheless took good care of his wound. Does not appear to be infected I do think we need to go back to the compression wrap however which I think would definitely benefit him. Fortunately I do not see any signs of active infection at this time which is great news. 08-17-2022 upon evaluation today patient unfortunately appears to be showing signs of infection based on what I see at this time. Fortunately I do not see any evidence of systemic infection though locally I do definitely see some changes that are concerning. I do not think that this is systemic at all. 08-24-2022 upon evaluation today patient appears to be doing well currently in regard to his wound. He has been tolerating the dressing changes this looks significantly improved compared to last week and very pleased. I do not see any signs of active infection locally or systemically at this time. 08-31-2022 upon evaluation today patient appears to be doing well currently in regard to his wound this is actually showing signs of improvement and actually very pleased with where we stand today. Fortunately I do not see any signs of active infection which is great news. 09-14-2022 upon evaluation today patient appears to be doing well current regard to his wound with section showing signs of improvement would be using endoform which I think is really doing a great job here. Fortunately I do not see any signs of worsening overall. 09-21-2022 upon evaluation today patient appears to be doing well currently in regard to his wound. He has been tolerating the dressing changes without complication. Fortunately there does not appear to be any signs of active infection at this time which is good news. 09-28-2022 upon evaluation today patient appears to be doing well currently in regard to his wound which is actually measuring quite a bit smaller. This looks to be doing well and very pleased with where things stand today I do not see any signs of  active infection locally or systemically which is great news. No fevers, chills, nausea, vomiting, or diarrhea. Electronic Signature(s) Signed: 09/28/2022 3:22:40 PM By: Allen Derry PA-C Entered By: Allen Derry on 09/28/2022 12:22:40 -------------------------------------------------------------------------------- Physical Exam Details Patient Name: Date of Service: [name], [name] 09/28/2022 2:45 PM Medical Record Number: [id number] Patient Account Number: [id number] Date of Birth/Sex: Treating RN: Nov 08, 1955 (67 y.o. M) Primary Care Provider: [name] Other Clinician: Referring Provider: Treating Provider/Extender: Alver Fisher in Treatment: 60 Constitutional Obese and well-hydrated in no acute distress. Respiratory normal breathing without difficulty. Psychiatric this patient is able to make decisions and demonstrates good insight into disease process. Alert and Oriented x 3. pleasant and cooperative. Notes Upon inspection patient's wound bed actually showed signs of good granulation epithelization at this point. I do believe that he is going require some sharp debridement postdebridement the wound bed appears to be doing much better which is great news and in general I do think that we are making headway towards complete closure. Electronic Signature(s) Signed: 09/28/2022 3:23:04 PM By: Allen Derry PA-C  K2 as directed (alternative to 4 layer compression). Electronic Signature(s) Signed: 09/28/2022 3:48:11 PM By: Dava Najjar, Dannielle Huh [name] ([id number]) [id number] Page 7 of 13 Signed: 09/28/2022 5:41:52 PM By: Shawn Stall RN, BSN Entered By: Shawn Stall on 09/28/2022  12:19:40 -------------------------------------------------------------------------------- Problem List Details Patient Name: Date of Service: [name], [name] 09/28/2022 2:45 PM Medical Record Number: [id number] Patient Account Number: [id number] Date of Birth/Sex: Treating RN: April 21, 1955 (67 y.o. M) Primary Care Provider: [name] Other Clinician: Referring Provider: Treating Provider/Extender: Alver Fisher in Treatment: 60 Active Problems ICD-10 Encounter Code Description Active Date MDM Diagnosis I89.0 Lymphedema, not elsewhere classified 08/04/2021 No Yes I87.333 Chronic venous hypertension (idiopathic) with ulcer and inflammation of 08/04/2021 No Yes bilateral lower extremity L97.828 Non-pressure chronic ulcer of other part of left lower leg with other specified 08/04/2021 No Yes severity L97.818 Non-pressure chronic ulcer of other part of right lower leg with other specified 08/04/2021 No Yes severity E11.621 Type 2 diabetes mellitus with foot ulcer 08/04/2021 No Yes L97.518 Non-pressure chronic ulcer of other part of right foot with other specified 08/04/2021 No Yes severity Inactive Problems Resolved Problems Electronic Signature(s) Signed: 09/28/2022 2:42:49 PM By: Allen Derry PA-C Entered By: Allen Derry on 09/28/2022 11:42:49 -------------------------------------------------------------------------------- Progress Note Details Patient Name: Date of Service: [name], [name] 09/28/2022 2:45 PM Medical Record Number: [id number] Patient Account Number: [id number] Date of Birth/Sex: Treating RN: May 05, 1955 (67 y.o. [name], [name] ([id number]) [id number] Page 8 of 13 Primary Care Provider: [name] Other Clinician: Referring Provider: Treating Provider/Extender: Alver Fisher in Treatment: 60 Subjective Chief Complaint Information obtained from Patient 08/04/2021; patient returns to clinic with  bilateral leg wounds as well as areas on the right foot History of Present Illness (HPI) ADMISSION 03/22/2021 This is a 67 year old man with a past medical history significant for diabetes type 2, congestive heart failure, peripheral arterial disease, morbid obesity, venous insufficiency, and coronary artery disease. He has been followed by Dr. Loreta Ave in podiatry, who performed a transmetatarsal amputation on the left foot in August 2022. He had issues healing that wound, but based upon Dr. Kenna Gilbert notes, ultimately the TMA wound healed. During his recovery from that surgery, however, ulcers opened up over the DIP joint of the right second and third toe. These have apparently closed and reopened multiple times. It sounds like one of the issues has been moisture accumulation and maceration of the tissues causing them to reopen. At his last visit with Dr. Loreta Ave, on March 01, 2021, there continues to be problems with moisture and he was referred to wound care for further evaluation and management. He had a formal aortogram with runoff performed prior to his TMA. The findings are copied here: Patient has inline flow to both feet with no significant flow-limiting lesion that would be amenable to percutaneous or open revascularization. He does have an element of small vessel disease and has a short segment occlusion of the distal anterior tibial/dorsalis pedis artery on the left foot but does have posterior tibial artery flow. Would recommend management of wounds with amputation of toes 2 and 3 on the right foot if the wounds do not heal and deteriorate. Transmetatarsal amputation on the left side has as good a blood supply as it is going to get and hopefully this will heal in the future. Formal ABIs were done in January 2023. They are normal bilaterally. ABI Findings: +---------+------------------+-----+----------+--------+ Right Rt Pressure (mmHg)IndexWaveform Comment   +---------+------------------+-----+----------+--------+ Brachial  Entered By: Allen Derry on 09/28/2022 12:23:04 -------------------------------------------------------------------------------- Physician Orders Details Patient Name: Date of Service: [place], [place] [name]. 09/28/2022 2:45 PM Medical Record Number: [id number] Patient Account Number: [id number] Date of Birth/Sex: Treating RN: 1955/05/23 (67 y.o. [name] Primary Care Provider: [name] Other Clinician: Referring Provider: Treating Provider/Extender: Alver Fisher in Treatment: [address] [place] [name] ([id number])  [id number] Page 6 of 13 Verbal / Phone Orders: No Diagnosis Coding ICD-10 Coding Code Description I89.0 Lymphedema, not elsewhere classified I87.333 Chronic venous hypertension (idiopathic) with ulcer and inflammation of bilateral lower extremity L97.828 Non-pressure chronic ulcer of other part of left lower leg with other specified severity L97.818 Non-pressure chronic ulcer of other part of right lower leg with other specified severity E11.621 Type 2 diabetes mellitus with foot ulcer L97.518 Non-pressure chronic ulcer of other part of right foot with other specified severity Follow-up Appointments ppointment in 1 week. Leonard Schwartz Wednesday 10/05/2022 (already scheduled) Return A ppointment in 2 weeks. Leonard Schwartz Wednesday 10/12/2022 (already scheduled) Return A Return appointment in 3 weeks. Leonard Schwartz Wednesday 10/19/2022 (already scheduled) Anesthetic (In clinic) Topical Lidocaine 5% applied to wound bed - Used in Clinic Prior to debridement (In clinic) Topical Lidocaine 4% applied to wound bed Cellular or Tissue Based Products Cellular or Tissue Based Product Type: - Run IVR for Kerrecis- denied #1 Apligraf applied 02/23/2022 #2 Apligraf applied 03/02/2022 #3 Apligraf applied 03/16/2022 #4 Apligraf applied 03/30/2022 HOLD APLIGRAF this week. Cellular or Tissue Based Product applied to wound bed, secured with steri-strips, cover with Adaptic or Mepitel. (DO NOT REMOVE). Bathing/ Shower/ Hygiene May shower with protection but do not get wound dressing(s) wet. Protect dressing(s) with water repellant cover (for example, large plastic bag) or a cast cover and may then take shower. Edema Control - Lymphedema / SCD / Other Elevate legs to the level of the heart or above for 30 minutes daily and/or when sitting for 3-4 times a day throughout the day. Avoid standing for long periods of time. Patient to wear own compression stockings every day. - apply in the morning and remove  at night left leg. Exercise regularly Moisturize legs daily. - apply every night before bed. Compression stocking or Garment 30-40 mm/Hg pressure to: - wear your VIVE compression stocking to left leg. apply in the morning and remove at night. Wound Treatment Wound #7 - Malleolus Wound Laterality: Right, Medial Cleanser: Soap and Water 1 x Per Week/30 Days Discharge Instructions: May shower and wash wound with dial antibacterial soap and water prior to dressing change. Cleanser: Vashe 5.8 (oz) 1 x Per Week/30 Days Discharge Instructions: Cleanse the wound with Vashe prior to applying a clean dressing using gauze sponges, not tissue or cotton balls. Peri-Wound Care: Sween Lotion (Moisturizing lotion) 1 x Per Week/30 Days Discharge Instructions: Apply moisturizing lotion as directed Topical: Gentamicin 1 x Per Week/30 Days Discharge Instructions: As directed by physician Prim Dressing: Endoform 2x2 in (Dispense As Written) 1 x Per Week/30 Days ary Discharge Instructions: Moisten with saline Secondary Dressing: CarboFLEX Odor Control Dressing, 4x4 in 1 x Per Week/30 Days Discharge Instructions: Apply over primary dressing as directed. Secondary Dressing: Drawtex 4x4 in 1 x Per Week/30 Days Discharge Instructions: Apply over primary dressing as directed. Secondary Dressing: Zetuvit Plus 4x8 in 1 x Per Week/30 Days Discharge Instructions: Apply over primary dressing as directed. Compression Wrap: Urgo K2, (equivalent to a 4 layer) two layer compression system, regular 1 x Per Week/30 Days Discharge Instructions: Apply Jeryl Columbia  tolerating the dressing changes without complication. Fortunately there does not appear to be any signs of infection. Fortunately there does not appear to be any signs of infection 08-10-2022 upon evaluation today patient appears to be doing okay in regard to his wound. He actually has been gone for 2 weeks and in the interim [name], [name] ([id number]) [id number] Page 5 of 13 unfortunately he did not have result out of the Tegaderm as well as  hoping for but nonetheless took good care of his wound. Does not appear to be infected I do think we need to go back to the compression wrap however which I think would definitely benefit him. Fortunately I do not see any signs of active infection at this time which is great news. 08-17-2022 upon evaluation today patient unfortunately appears to be showing signs of infection based on what I see at this time. Fortunately I do not see any evidence of systemic infection though locally I do definitely see some changes that are concerning. I do not think that this is systemic at all. 08-24-2022 upon evaluation today patient appears to be doing well currently in regard to his wound. He has been tolerating the dressing changes this looks significantly improved compared to last week and very pleased. I do not see any signs of active infection locally or systemically at this time. 08-31-2022 upon evaluation today patient appears to be doing well currently in regard to his wound this is actually showing signs of improvement and actually very pleased with where we stand today. Fortunately I do not see any signs of active infection which is great news. 09-14-2022 upon evaluation today patient appears to be doing well current regard to his wound with section showing signs of improvement would be using endoform which I think is really doing a great job here. Fortunately I do not see any signs of worsening overall. 09-21-2022 upon evaluation today patient appears to be doing well currently in regard to his wound. He has been tolerating the dressing changes without complication. Fortunately there does not appear to be any signs of active infection at this time which is good news. 09-28-2022 upon evaluation today patient appears to be doing well currently in regard to his wound which is actually measuring quite a bit smaller. This looks to be doing well and very pleased with where things stand today I do not see any signs of  active infection locally or systemically which is great news. No fevers, chills, nausea, vomiting, or diarrhea. Electronic Signature(s) Signed: 09/28/2022 3:22:40 PM By: Allen Derry PA-C Entered By: Allen Derry on 09/28/2022 12:22:40 -------------------------------------------------------------------------------- Physical Exam Details Patient Name: Date of Service: [name], [name] 09/28/2022 2:45 PM Medical Record Number: [id number] Patient Account Number: [id number] Date of Birth/Sex: Treating RN: Nov 08, 1955 (67 y.o. M) Primary Care Provider: [name] Other Clinician: Referring Provider: Treating Provider/Extender: Alver Fisher in Treatment: 60 Constitutional Obese and well-hydrated in no acute distress. Respiratory normal breathing without difficulty. Psychiatric this patient is able to make decisions and demonstrates good insight into disease process. Alert and Oriented x 3. pleasant and cooperative. Notes Upon inspection patient's wound bed actually showed signs of good granulation epithelization at this point. I do believe that he is going require some sharp debridement postdebridement the wound bed appears to be doing much better which is great news and in general I do think that we are making headway towards complete closure. Electronic Signature(s) Signed: 09/28/2022 3:23:04 PM By: Allen Derry PA-C  Entered By: Allen Derry on 09/28/2022 12:23:04 -------------------------------------------------------------------------------- Physician Orders Details Patient Name: Date of Service: [place], [place] [name]. 09/28/2022 2:45 PM Medical Record Number: [id number] Patient Account Number: [id number] Date of Birth/Sex: Treating RN: 1955/05/23 (67 y.o. [name] Primary Care Provider: [name] Other Clinician: Referring Provider: Treating Provider/Extender: Alver Fisher in Treatment: [address] [name] ([id number])  [id number] Page 6 of 13 Verbal / Phone Orders: No Diagnosis Coding ICD-10 Coding Code Description I89.0 Lymphedema, not elsewhere classified I87.333 Chronic venous hypertension (idiopathic) with ulcer and inflammation of bilateral lower extremity L97.828 Non-pressure chronic ulcer of other part of left lower leg with other specified severity L97.818 Non-pressure chronic ulcer of other part of right lower leg with other specified severity E11.621 Type 2 diabetes mellitus with foot ulcer L97.518 Non-pressure chronic ulcer of other part of right foot with other specified severity Follow-up Appointments ppointment in 1 week. Leonard Schwartz Wednesday 10/05/2022 (already scheduled) Return A ppointment in 2 weeks. Leonard Schwartz Wednesday 10/12/2022 (already scheduled) Return A Return appointment in 3 weeks. Leonard Schwartz Wednesday 10/19/2022 (already scheduled) Anesthetic (In clinic) Topical Lidocaine 5% applied to wound bed - Used in Clinic Prior to debridement (In clinic) Topical Lidocaine 4% applied to wound bed Cellular or Tissue Based Products Cellular or Tissue Based Product Type: - Run IVR for Kerrecis- denied #1 Apligraf applied 02/23/2022 #2 Apligraf applied 03/02/2022 #3 Apligraf applied 03/16/2022 #4 Apligraf applied 03/30/2022 HOLD APLIGRAF this week. Cellular or Tissue Based Product applied to wound bed, secured with steri-strips, cover with Adaptic or Mepitel. (DO NOT REMOVE). Bathing/ Shower/ Hygiene May shower with protection but do not get wound dressing(s) wet. Protect dressing(s) with water repellant cover (for example, large plastic bag) or a cast cover and may then take shower. Edema Control - Lymphedema / SCD / Other Elevate legs to the level of the heart or above for 30 minutes daily and/or when sitting for 3-4 times a day throughout the day. Avoid standing for long periods of time. Patient to wear own compression stockings every day. - apply in the morning and remove  at night left leg. Exercise regularly Moisturize legs daily. - apply every night before bed. Compression stocking or Garment 30-40 mm/Hg pressure to: - wear your VIVE compression stocking to left leg. apply in the morning and remove at night. Wound Treatment Wound #7 - Malleolus Wound Laterality: Right, Medial Cleanser: Soap and Water 1 x Per Week/30 Days Discharge Instructions: May shower and wash wound with dial antibacterial soap and water prior to dressing change. Cleanser: Vashe 5.8 (oz) 1 x Per Week/30 Days Discharge Instructions: Cleanse the wound with Vashe prior to applying a clean dressing using gauze sponges, not tissue or cotton balls. Peri-Wound Care: Sween Lotion (Moisturizing lotion) 1 x Per Week/30 Days Discharge Instructions: Apply moisturizing lotion as directed Topical: Gentamicin 1 x Per Week/30 Days Discharge Instructions: As directed by physician Prim Dressing: Endoform 2x2 in (Dispense As Written) 1 x Per Week/30 Days ary Discharge Instructions: Moisten with saline Secondary Dressing: CarboFLEX Odor Control Dressing, 4x4 in 1 x Per Week/30 Days Discharge Instructions: Apply over primary dressing as directed. Secondary Dressing: Drawtex 4x4 in 1 x Per Week/30 Days Discharge Instructions: Apply over primary dressing as directed. Secondary Dressing: Zetuvit Plus 4x8 in 1 x Per Week/30 Days Discharge Instructions: Apply over primary dressing as directed. Compression Wrap: Urgo K2, (equivalent to a 4 layer) two layer compression system, regular 1 x Per Week/30 Days Discharge Instructions: Apply Jeryl Columbia  tolerating the dressing changes without complication. Fortunately there does not appear to be any signs of infection. Fortunately there does not appear to be any signs of infection 08-10-2022 upon evaluation today patient appears to be doing okay in regard to his wound. He actually has been gone for 2 weeks and in the interim [name], [name] ([id number]) [id number] Page 5 of 13 unfortunately he did not have result out of the Tegaderm as well as  hoping for but nonetheless took good care of his wound. Does not appear to be infected I do think we need to go back to the compression wrap however which I think would definitely benefit him. Fortunately I do not see any signs of active infection at this time which is great news. 08-17-2022 upon evaluation today patient unfortunately appears to be showing signs of infection based on what I see at this time. Fortunately I do not see any evidence of systemic infection though locally I do definitely see some changes that are concerning. I do not think that this is systemic at all. 08-24-2022 upon evaluation today patient appears to be doing well currently in regard to his wound. He has been tolerating the dressing changes this looks significantly improved compared to last week and very pleased. I do not see any signs of active infection locally or systemically at this time. 08-31-2022 upon evaluation today patient appears to be doing well currently in regard to his wound this is actually showing signs of improvement and actually very pleased with where we stand today. Fortunately I do not see any signs of active infection which is great news. 09-14-2022 upon evaluation today patient appears to be doing well current regard to his wound with section showing signs of improvement would be using endoform which I think is really doing a great job here. Fortunately I do not see any signs of worsening overall. 09-21-2022 upon evaluation today patient appears to be doing well currently in regard to his wound. He has been tolerating the dressing changes without complication. Fortunately there does not appear to be any signs of active infection at this time which is good news. 09-28-2022 upon evaluation today patient appears to be doing well currently in regard to his wound which is actually measuring quite a bit smaller. This looks to be doing well and very pleased with where things stand today I do not see any signs of  active infection locally or systemically which is great news. No fevers, chills, nausea, vomiting, or diarrhea. Electronic Signature(s) Signed: 09/28/2022 3:22:40 PM By: Allen Derry PA-C Entered By: Allen Derry on 09/28/2022 12:22:40 -------------------------------------------------------------------------------- Physical Exam Details Patient Name: Date of Service: [name], [name] 09/28/2022 2:45 PM Medical Record Number: [id number] Patient Account Number: [id number] Date of Birth/Sex: Treating RN: Nov 08, 1955 (67 y.o. M) Primary Care Provider: [name] Other Clinician: Referring Provider: Treating Provider/Extender: Alver Fisher in Treatment: 60 Constitutional Obese and well-hydrated in no acute distress. Respiratory normal breathing without difficulty. Psychiatric this patient is able to make decisions and demonstrates good insight into disease process. Alert and Oriented x 3. pleasant and cooperative. Notes Upon inspection patient's wound bed actually showed signs of good granulation epithelization at this point. I do believe that he is going require some sharp debridement postdebridement the wound bed appears to be doing much better which is great news and in general I do think that we are making headway towards complete closure. Electronic Signature(s) Signed: 09/28/2022 3:23:04 PM By: Allen Derry PA-C  Entered By: Allen Derry on 09/28/2022 12:23:04 -------------------------------------------------------------------------------- Physician Orders Details Patient Name: Date of Service: [place], [place] [name]. 09/28/2022 2:45 PM Medical Record Number: [id number] Patient Account Number: [id number] Date of Birth/Sex: Treating RN: 1955/05/23 (67 y.o. [name] Primary Care Provider: [name] Other Clinician: Referring Provider: Treating Provider/Extender: Alver Fisher in Treatment: [address] [name] ([id number])  [id number] Page 6 of 13 Verbal / Phone Orders: No Diagnosis Coding ICD-10 Coding Code Description I89.0 Lymphedema, not elsewhere classified I87.333 Chronic venous hypertension (idiopathic) with ulcer and inflammation of bilateral lower extremity L97.828 Non-pressure chronic ulcer of other part of left lower leg with other specified severity L97.818 Non-pressure chronic ulcer of other part of right lower leg with other specified severity E11.621 Type 2 diabetes mellitus with foot ulcer L97.518 Non-pressure chronic ulcer of other part of right foot with other specified severity Follow-up Appointments ppointment in 1 week. Leonard Schwartz Wednesday 10/05/2022 (already scheduled) Return A ppointment in 2 weeks. Leonard Schwartz Wednesday 10/12/2022 (already scheduled) Return A Return appointment in 3 weeks. Leonard Schwartz Wednesday 10/19/2022 (already scheduled) Anesthetic (In clinic) Topical Lidocaine 5% applied to wound bed - Used in Clinic Prior to debridement (In clinic) Topical Lidocaine 4% applied to wound bed Cellular or Tissue Based Products Cellular or Tissue Based Product Type: - Run IVR for Kerrecis- denied #1 Apligraf applied 02/23/2022 #2 Apligraf applied 03/02/2022 #3 Apligraf applied 03/16/2022 #4 Apligraf applied 03/30/2022 HOLD APLIGRAF this week. Cellular or Tissue Based Product applied to wound bed, secured with steri-strips, cover with Adaptic or Mepitel. (DO NOT REMOVE). Bathing/ Shower/ Hygiene May shower with protection but do not get wound dressing(s) wet. Protect dressing(s) with water repellant cover (for example, large plastic bag) or a cast cover and may then take shower. Edema Control - Lymphedema / SCD / Other Elevate legs to the level of the heart or above for 30 minutes daily and/or when sitting for 3-4 times a day throughout the day. Avoid standing for long periods of time. Patient to wear own compression stockings every day. - apply in the morning and remove  at night left leg. Exercise regularly Moisturize legs daily. - apply every night before bed. Compression stocking or Garment 30-40 mm/Hg pressure to: - wear your VIVE compression stocking to left leg. apply in the morning and remove at night. Wound Treatment Wound #7 - Malleolus Wound Laterality: Right, Medial Cleanser: Soap and Water 1 x Per Week/30 Days Discharge Instructions: May shower and wash wound with dial antibacterial soap and water prior to dressing change. Cleanser: Vashe 5.8 (oz) 1 x Per Week/30 Days Discharge Instructions: Cleanse the wound with Vashe prior to applying a clean dressing using gauze sponges, not tissue or cotton balls. Peri-Wound Care: Sween Lotion (Moisturizing lotion) 1 x Per Week/30 Days Discharge Instructions: Apply moisturizing lotion as directed Topical: Gentamicin 1 x Per Week/30 Days Discharge Instructions: As directed by physician Prim Dressing: Endoform 2x2 in (Dispense As Written) 1 x Per Week/30 Days ary Discharge Instructions: Moisten with saline Secondary Dressing: CarboFLEX Odor Control Dressing, 4x4 in 1 x Per Week/30 Days Discharge Instructions: Apply over primary dressing as directed. Secondary Dressing: Drawtex 4x4 in 1 x Per Week/30 Days Discharge Instructions: Apply over primary dressing as directed. Secondary Dressing: Zetuvit Plus 4x8 in 1 x Per Week/30 Days Discharge Instructions: Apply over primary dressing as directed. Compression Wrap: Urgo K2, (equivalent to a 4 layer) two layer compression system, regular 1 x Per Week/30 Days Discharge Instructions: Apply Jeryl Columbia  K2 as directed (alternative to 4 layer compression). Electronic Signature(s) Signed: 09/28/2022 3:48:11 PM By: Dava Najjar, Dannielle Huh [name] ([id number]) [id number] Page 7 of 13 Signed: 09/28/2022 5:41:52 PM By: Shawn Stall RN, BSN Entered By: Shawn Stall on 09/28/2022  12:19:40 -------------------------------------------------------------------------------- Problem List Details Patient Name: Date of Service: [name], [name] 09/28/2022 2:45 PM Medical Record Number: [id number] Patient Account Number: [id number] Date of Birth/Sex: Treating RN: April 21, 1955 (67 y.o. M) Primary Care Provider: [name] Other Clinician: Referring Provider: Treating Provider/Extender: Alver Fisher in Treatment: 60 Active Problems ICD-10 Encounter Code Description Active Date MDM Diagnosis I89.0 Lymphedema, not elsewhere classified 08/04/2021 No Yes I87.333 Chronic venous hypertension (idiopathic) with ulcer and inflammation of 08/04/2021 No Yes bilateral lower extremity L97.828 Non-pressure chronic ulcer of other part of left lower leg with other specified 08/04/2021 No Yes severity L97.818 Non-pressure chronic ulcer of other part of right lower leg with other specified 08/04/2021 No Yes severity E11.621 Type 2 diabetes mellitus with foot ulcer 08/04/2021 No Yes L97.518 Non-pressure chronic ulcer of other part of right foot with other specified 08/04/2021 No Yes severity Inactive Problems Resolved Problems Electronic Signature(s) Signed: 09/28/2022 2:42:49 PM By: Allen Derry PA-C Entered By: Allen Derry on 09/28/2022 11:42:49 -------------------------------------------------------------------------------- Progress Note Details Patient Name: Date of Service: [name], [name] 09/28/2022 2:45 PM Medical Record Number: [id number] Patient Account Number: [id number] Date of Birth/Sex: Treating RN: May 05, 1955 (67 y.o. [name], [name] ([id number]) [id number] Page 8 of 13 Primary Care Provider: [name] Other Clinician: Referring Provider: Treating Provider/Extender: Alver Fisher in Treatment: 60 Subjective Chief Complaint Information obtained from Patient 08/04/2021; patient returns to clinic with  bilateral leg wounds as well as areas on the right foot History of Present Illness (HPI) ADMISSION 03/22/2021 This is a 67 year old man with a past medical history significant for diabetes type 2, congestive heart failure, peripheral arterial disease, morbid obesity, venous insufficiency, and coronary artery disease. He has been followed by Dr. Loreta Ave in podiatry, who performed a transmetatarsal amputation on the left foot in August 2022. He had issues healing that wound, but based upon Dr. Kenna Gilbert notes, ultimately the TMA wound healed. During his recovery from that surgery, however, ulcers opened up over the DIP joint of the right second and third toe. These have apparently closed and reopened multiple times. It sounds like one of the issues has been moisture accumulation and maceration of the tissues causing them to reopen. At his last visit with Dr. Loreta Ave, on March 01, 2021, there continues to be problems with moisture and he was referred to wound care for further evaluation and management. He had a formal aortogram with runoff performed prior to his TMA. The findings are copied here: Patient has inline flow to both feet with no significant flow-limiting lesion that would be amenable to percutaneous or open revascularization. He does have an element of small vessel disease and has a short segment occlusion of the distal anterior tibial/dorsalis pedis artery on the left foot but does have posterior tibial artery flow. Would recommend management of wounds with amputation of toes 2 and 3 on the right foot if the wounds do not heal and deteriorate. Transmetatarsal amputation on the left side has as good a blood supply as it is going to get and hopefully this will heal in the future. Formal ABIs were done in January 2023. They are normal bilaterally. ABI Findings: +---------+------------------+-----+----------+--------+ Right Rt Pressure (mmHg)[employer] Comment   +---------+------------------+-----+----------+--------+ Brachial

## 2022-09-29 NOTE — Telephone Encounter (Signed)
Advised Pt of Dr. Lubertha Basque recommendations.

## 2022-09-30 NOTE — Progress Notes (Signed)
Service: [name] 09/28/2022 2:45 PM Medical Record Number: [id number] Patient Account Number: [id number] Date of Birth/Sex: Treating RN: [birth year] (67 y.o. [name] Primary Care Alandra Sando: [name] Other Clinician: Referring Rhylan Kagel: Treating Dru Primeau/Extender: Alver Fisher in Treatment: 60 Multidisciplinary Care Plan reviewed with physician Active Inactive Pain, Acute or Chronic Nursing Diagnoses: Pain, acute or chronic: actual or potential Potential alteration in comfort, pain Goals: Patient will verbalize adequate pain control and receive pain control interventions during procedures as needed Date Initiated: 08/04/2021 Target Resolution Date: 01/10/2023 Goal Status: Active Patient/caregiver will verbalize comfort level met Date Initiated: 08/04/2021 Date Inactivated: 07/27/2022 Target Resolution Date: 09/10/2022 Goal Status: Met Interventions: Encourage patient to take pain medications as prescribed Provide education on pain management Reposition patient for comfort Treatment Activities: Administer pain control measures as ordered :  08/04/2021 Notes: Electronic Signature(s) Signed: 09/28/2022 5:41:52 PM By: Shawn Stall RN, BSN Entered By: Shawn Stall on 09/28/2022 12:19:49 -------------------------------------------------------------------------------- Pain Assessment Details Patient Name: Date of Service: [name]. 09/28/2022 2:45 PM Medical Record Number: [id number] Patient Account Number: [id number] Date of Birth/Sex: Treating RN: Oct 19, 1955 (67 y.o. M) Primary Care Anays Detore: [name] Other Clinician: Referring Demarea Lorey: Treating Sherill Wegener/Extender: Alver Fisher in Treatment: 60 Active Problems Location of Pain Severity and Description of Pain Patient Has Paino No Site Locations [place], [place] [name] ([id number]) [id number] Page 4 of 6 Pain Management and Medication Current Pain Management: Electronic Signature(s) Signed: 09/30/2022 12:48:07 PM By: Thayer Dallas Entered By: Thayer Dallas on 09/28/2022 11:54:39 -------------------------------------------------------------------------------- Patient/Caregiver Education Details Patient Name: Date of Service: [name]. 9/18/2024andnbsp2:45 PM Medical Record Number: [id number] Patient Account Number: [id number] Date of Birth/Gender: Treating RN: 30-Apr-1955 (67 y.o. [name] Primary Care Physician: [name] Other Clinician: Referring Physician: Treating Physician/Extender: Alver Fisher in Treatment: 60 Education Assessment Education Provided To: Patient Education Topics Provided Wound/Skin Impairment: Handouts: Caring for Your Ulcer Methods: Explain/Verbal Responses: Reinforcements needed Electronic Signature(s) Signed: 09/28/2022 5:41:52 PM By: Shawn Stall RN, BSN Entered By: Shawn Stall on 09/28/2022 12:20:03 -------------------------------------------------------------------------------- Wound Assessment Details Patient Name: Date of Service: [name],  [name]. 09/28/2022 2:45 PM Medical Record Number: [id number] Patient Account Number: [id number] Date of Birth/Sex: Treating RN: Apr 23, 1955 (67 y.o. M) Primary Care Evanthia Maund: [name] Other Clinician: OLAYINKA, [name] ([id number]) [id number] Page 5 of 6 Referring Geremy Rister: Treating Andres Bantz/Extender: Alver Fisher in Treatment: 60 Wound Status Wound Number: 7 Primary Venous Leg Ulcer Etiology: Wound Location: Right, Medial Malleolus Secondary Lymphedema Wounding Event: Blister Etiology: Date Acquired: 07/21/2021 Wound Open Weeks Of Treatment: 60 Status: Clustered Wound: No Comorbid Sleep Apnea, Arrhythmia, Congestive Heart Failure, History: Hypertension, Hypotension, Peripheral Arterial Disease, Peripheral Venous Disease, Type II Diabetes, Gout, Osteoarthritis Photos Wound Measurements Length: (cm) 3.8 Width: (cm) 1.8 Depth: (cm) 0.3 Area: (cm) 5.372 Volume: (cm) 1.612 % Reduction in Area: 86% % Reduction in Volume: 94% Epithelialization: Small (1-33%) Tunneling: No Undermining: No Wound Description Classification: Full Thickness With Exposed Supp Exudate Amount: Medium Exudate Type: Serosanguineous Exudate Color: red, brown ort Structures Foul Odor After Cleansing: No Slough/Fibrino Yes Wound Bed Granulation Amount: Large (67-100%) Exposed Structure Granulation Quality: Red Fascia Exposed: No Necrotic Amount: Small (1-33%) Fat Layer (Subcutaneous Tissue) Exposed: Yes Necrotic Quality: Adherent Slough Tendon Exposed: No Muscle Exposed: No Joint Exposed: No Bone Exposed: No Periwound Skin Texture Texture Color No Abnormalities Noted: No No Abnormalities Noted: No Callus: No Atrophie Blanche: No Crepitus: No  Service: [name] 09/28/2022 2:45 PM Medical Record Number: [id number] Patient Account Number: [id number] Date of Birth/Sex: Treating RN: [birth year] (67 y.o. [name] Primary Care Alandra Sando: [name] Other Clinician: Referring Rhylan Kagel: Treating Dru Primeau/Extender: Alver Fisher in Treatment: 60 Multidisciplinary Care Plan reviewed with physician Active Inactive Pain, Acute or Chronic Nursing Diagnoses: Pain, acute or chronic: actual or potential Potential alteration in comfort, pain Goals: Patient will verbalize adequate pain control and receive pain control interventions during procedures as needed Date Initiated: 08/04/2021 Target Resolution Date: 01/10/2023 Goal Status: Active Patient/caregiver will verbalize comfort level met Date Initiated: 08/04/2021 Date Inactivated: 07/27/2022 Target Resolution Date: 09/10/2022 Goal Status: Met Interventions: Encourage patient to take pain medications as prescribed Provide education on pain management Reposition patient for comfort Treatment Activities: Administer pain control measures as ordered :  08/04/2021 Notes: Electronic Signature(s) Signed: 09/28/2022 5:41:52 PM By: Shawn Stall RN, BSN Entered By: Shawn Stall on 09/28/2022 12:19:49 -------------------------------------------------------------------------------- Pain Assessment Details Patient Name: Date of Service: [name]. 09/28/2022 2:45 PM Medical Record Number: [id number] Patient Account Number: [id number] Date of Birth/Sex: Treating RN: Oct 19, 1955 (67 y.o. M) Primary Care Anays Detore: [name] Other Clinician: Referring Demarea Lorey: Treating Sherill Wegener/Extender: Alver Fisher in Treatment: 60 Active Problems Location of Pain Severity and Description of Pain Patient Has Paino No Site Locations [place], [name] ([id number]) [id number] Page 4 of 6 Pain Management and Medication Current Pain Management: Electronic Signature(s) Signed: 09/30/2022 12:48:07 PM By: Thayer Dallas Entered By: Thayer Dallas on 09/28/2022 11:54:39 -------------------------------------------------------------------------------- Patient/Caregiver Education Details Patient Name: Date of Service: [name]. 9/18/2024andnbsp2:45 PM Medical Record Number: [id number] Patient Account Number: [id number] Date of Birth/Gender: Treating RN: 30-Apr-1955 (67 y.o. [name] Primary Care Physician: [name] Other Clinician: Referring Physician: Treating Physician/Extender: Alver Fisher in Treatment: 60 Education Assessment Education Provided To: Patient Education Topics Provided Wound/Skin Impairment: Handouts: Caring for Your Ulcer Methods: Explain/Verbal Responses: Reinforcements needed Electronic Signature(s) Signed: 09/28/2022 5:41:52 PM By: Shawn Stall RN, BSN Entered By: Shawn Stall on 09/28/2022 12:20:03 -------------------------------------------------------------------------------- Wound Assessment Details Patient Name: Date of Service: [name],  [name]. 09/28/2022 2:45 PM Medical Record Number: [id number] Patient Account Number: [id number] Date of Birth/Sex: Treating RN: Apr 23, 1955 (67 y.o. M) Primary Care Evanthia Maund: [name] Other Clinician: OLAYINKA, [name] ([id number]) [id number] Page 5 of 6 Referring Geremy Rister: Treating Andres Bantz/Extender: Alver Fisher in Treatment: 60 Wound Status Wound Number: 7 Primary Venous Leg Ulcer Etiology: Wound Location: Right, Medial Malleolus Secondary Lymphedema Wounding Event: Blister Etiology: Date Acquired: 07/21/2021 Wound Open Weeks Of Treatment: 60 Status: Clustered Wound: No Comorbid Sleep Apnea, Arrhythmia, Congestive Heart Failure, History: Hypertension, Hypotension, Peripheral Arterial Disease, Peripheral Venous Disease, Type II Diabetes, Gout, Osteoarthritis Photos Wound Measurements Length: (cm) 3.8 Width: (cm) 1.8 Depth: (cm) 0.3 Area: (cm) 5.372 Volume: (cm) 1.612 % Reduction in Area: 86% % Reduction in Volume: 94% Epithelialization: Small (1-33%) Tunneling: No Undermining: No Wound Description Classification: Full Thickness With Exposed Supp Exudate Amount: Medium Exudate Type: Serosanguineous Exudate Color: red, brown ort Structures Foul Odor After Cleansing: No Slough/Fibrino Yes Wound Bed Granulation Amount: Large (67-100%) Exposed Structure Granulation Quality: Red Fascia Exposed: No Necrotic Amount: Small (1-33%) Fat Layer (Subcutaneous Tissue) Exposed: Yes Necrotic Quality: Adherent Slough Tendon Exposed: No Muscle Exposed: No Joint Exposed: No Bone Exposed: No Periwound Skin Texture Texture Color No Abnormalities Noted: No No Abnormalities Noted: No Callus: No Atrophie Blanche: No Crepitus: No  Cyanosis: No Excoriation: No Ecchymosis: No Induration: No Erythema: No Rash: No Hemosiderin Staining: No Scarring: No Mottled: No Pallor: No Moisture Rubor: No No Abnormalities Noted: No Dry /  Scaly: No Temperature / Pain Maceration: Yes Temperature: No Abnormality Treatment Notes Wound #7 (Malleolus) Wound Laterality: Right, Medial Cleanser Soap and Water Discharge Instruction: May shower and wash wound with dial antibacterial soap and water prior to dressing change. Vashe 5.8 (oz) Discharge Instruction: Cleanse the wound with Vashe prior to applying a clean dressing using gauze sponges, not tissue or cotton balls. [name] ([id number]) [id number] Page 6 of 6 Peri-Wound Care Sween Lotion (Moisturizing lotion) Discharge Instruction: Apply moisturizing lotion as directed Topical Gentamicin Discharge Instruction: As directed by physician Primary Dressing Endoform 2x2 in Discharge Instruction: Moisten with saline Secondary Dressing CarboFLEX Odor Control Dressing, 4x4 in Discharge Instruction: Apply over primary dressing as directed. Drawtex 4x4 in Discharge Instruction: Apply over primary dressing as directed. Zetuvit Plus 4x8 in Discharge Instruction: Apply over primary dressing as directed. Secured With Compression Wrap Urgo K2, (equivalent to a 4 layer) two layer compression system, regular Discharge Instruction: Apply Urgo K2 as directed (alternative to 4 layer compression). Compression Stockings Add-Ons Electronic Signature(s) Signed: 09/30/2022 12:48:07 PM By: Thayer Dallas Entered By: Thayer Dallas on 09/28/2022 12:00:03 -------------------------------------------------------------------------------- Vitals Details Patient Name: Date of Service: [name], [name]. 09/28/2022 2:45 PM Medical Record Number: [id number] Patient Account Number: [id number] Date of Birth/Sex: Treating RN: 30-Apr-1955 (67 y.o. M) Primary Care Lauretta Sallas: [name] Other Clinician: Referring Ilah Boule: Treating Diamante Rubin/Extender: Alver Fisher in Treatment: 60 Vital Signs Time Taken: 14:54 Temperature (F): 98.6 Height (in):  71 Pulse (bpm): 71 Weight (lbs): 350 Respiratory Rate (breaths/min): 18 Body Mass Index (BMI): 48.8 Blood Pressure (mmHg): 128/82 Reference Range: 80 - 120 mg / dl Electronic Signature(s) Signed: 09/30/2022 12:48:07 PM By: Thayer Dallas Entered By: Thayer Dallas on 09/28/2022 11:54:32

## 2022-10-03 DIAGNOSIS — K080 Exfoliation of teeth due to systemic causes: Secondary | ICD-10-CM | POA: Diagnosis not present

## 2022-10-05 ENCOUNTER — Encounter (HOSPITAL_BASED_OUTPATIENT_CLINIC_OR_DEPARTMENT_OTHER): Payer: Medicare Other | Admitting: Physician Assistant

## 2022-10-05 DIAGNOSIS — Z6841 Body Mass Index (BMI) 40.0 and over, adult: Secondary | ICD-10-CM | POA: Diagnosis not present

## 2022-10-05 DIAGNOSIS — E11621 Type 2 diabetes mellitus with foot ulcer: Secondary | ICD-10-CM | POA: Diagnosis not present

## 2022-10-05 DIAGNOSIS — E1151 Type 2 diabetes mellitus with diabetic peripheral angiopathy without gangrene: Secondary | ICD-10-CM | POA: Diagnosis not present

## 2022-10-05 DIAGNOSIS — I509 Heart failure, unspecified: Secondary | ICD-10-CM | POA: Diagnosis not present

## 2022-10-05 DIAGNOSIS — L97818 Non-pressure chronic ulcer of other part of right lower leg with other specified severity: Secondary | ICD-10-CM | POA: Diagnosis not present

## 2022-10-05 DIAGNOSIS — L97312 Non-pressure chronic ulcer of right ankle with fat layer exposed: Secondary | ICD-10-CM | POA: Diagnosis not present

## 2022-10-05 DIAGNOSIS — I2510 Atherosclerotic heart disease of native coronary artery without angina pectoris: Secondary | ICD-10-CM | POA: Diagnosis not present

## 2022-10-05 DIAGNOSIS — I872 Venous insufficiency (chronic) (peripheral): Secondary | ICD-10-CM | POA: Diagnosis not present

## 2022-10-05 DIAGNOSIS — I110 Hypertensive heart disease with heart failure: Secondary | ICD-10-CM | POA: Diagnosis not present

## 2022-10-05 DIAGNOSIS — L97828 Non-pressure chronic ulcer of other part of left lower leg with other specified severity: Secondary | ICD-10-CM | POA: Diagnosis not present

## 2022-10-05 DIAGNOSIS — L97518 Non-pressure chronic ulcer of other part of right foot with other specified severity: Secondary | ICD-10-CM | POA: Diagnosis not present

## 2022-10-05 DIAGNOSIS — I87333 Chronic venous hypertension (idiopathic) with ulcer and inflammation of bilateral lower extremity: Secondary | ICD-10-CM | POA: Diagnosis not present

## 2022-10-05 DIAGNOSIS — I890 Lymphedema, not elsewhere classified: Secondary | ICD-10-CM | POA: Diagnosis not present

## 2022-10-05 NOTE — Progress Notes (Signed)
Remote ICD transmission.   

## 2022-10-05 NOTE — Progress Notes (Signed)
Apligraf applied 03/16/2022 #4 Apligraf applied 03/30/2022 HOLD APLIGRAF this week. Cellular or Tissue Based Product applied to wound bed, secured with steri-strips, cover with Adaptic or Mepitel. (DO NOT REMOVE). Bathing/ Shower/ Hygiene: May shower with protection but do not get wound dressing(s) wet. Protect dressing(s) with water repellant cover (for example, large plastic bag) or a cast cover and may then take shower. Edema Control - Lymphedema / SCD / Other: Elevate legs to the level of the heart or above for 30 minutes daily and/or when sitting for 3-4 times a day throughout the day. Avoid standing for long periods of time. Patient to wear own compression stockings every day. - apply in the morning and remove at night left leg. Exercise regularly Moisturize legs daily. - apply every night before bed. Compression stocking or Garment 30-40 mm/Hg pressure to: - wear your VIVE compression stocking to left leg. apply in the morning and remove at night. WOUND #7: - Malleolus Wound Laterality: Right, Medial Cleanser: Soap and Water 1 x Per Week/30 Days Discharge Instructions: May shower and wash wound with dial antibacterial soap and water prior to dressing change. Cleanser: Vashe 5.8 (oz) 1 x Per Week/30 Days Discharge Instructions: Cleanse the wound with Vashe prior to applying a clean dressing using gauze sponges, not tissue or cotton balls. Peri-Wound Care: Sween Lotion (Moisturizing lotion) 1 x Per Week/30 Days Discharge  Instructions: Apply moisturizing lotion as directed Topical: Gentamicin 1 x Per Week/30 Days Discharge Instructions: As directed by physician Prim Dressing: Endoform 2x2 in (Dispense As Written) 1 x Per Week/30 Days ary Discharge Instructions: Moisten with saline Secondary Dressing: CarboFLEX Odor Control Dressing, 4x4 in 1 x Per Week/30 Days Discharge Instructions: Apply over primary dressing as directed. Secondary Dressing: Drawtex 4x4 in 1 x Per Week/30 Days Discharge Instructions: Apply over primary dressing as directed. Secondary Dressing: Zetuvit Plus 4x8 in 1 x Per Week/30 Days Discharge Instructions: Apply over primary dressing as directed. Com pression Wrap: Urgo K2, (equivalent to a 4 layer) two layer compression system, regular 1 x Per Week/30 Days Discharge Instructions: Apply Urgo K2 as directed (alternative to 4 layer compression). 1. I would recommend that we have the patient continue with the wound care measures as before. We are using a little bit of the gentamicin down first followed by doing the endoform which I think is really doing a great job here. He has been using the Coverflex of the drawtex which seems to be doing excellent in the 4-layer compression wrap. 2. I am going to recommend as well he continue to elevate his legs much as possible to help with edema control more that he can do the better. We will see patient back for reevaluation in 1 week here in the clinic. If anything worsens or changes patient will contact our office for additional recommendations. Electronic Signature(s) Signed: 10/05/2022 3:48:05 PM By: [name] Derry PA-C Entered By: [name] [name] on 10/05/2022 15:48:05 -------------------------------------------------------------------------------- SuperBill Details Patient Name: Date of Service: [name] [name], [name] [name] 10/05/2022 Medical Record Number: [id number] Patient Account Number: [id number] Date of Birth/Sex: Treating RN: [birth year] (67 y.o. [name] Primary Care Provider: Martha [name] Other Clinician: Referring Provider: Treating Provider/Extender: [name] [name] in Treatment: [address] Diagnosis Coding ICD-10 Codes Code Description [name] [name], [name] [name] ([id number]) [id number] Page 12 of 12 I89.0 Lymphedema, not elsewhere classified I87.333 Chronic venous hypertension (idiopathic) with ulcer and inflammation of bilateral lower extremity L97.828 Non-pressure chronic ulcer of other part of left lower leg with other  [name] [name], [name] [name] ([id number]) [id number] Page 1 of 12 Visit Report for 10/05/2022 Chief Complaint Document Details Patient Name: Date of Service: [name] [name], [name] [name] 10/05/2022 2:45 PM Medical Record Number: [id number] Patient Account Number: [id number] Date of Birth/Sex: Treating RN: 1955-07-12 (67 y.o. M) Primary Care Provider: Martha [name] Other Clinician: Referring Provider: Treating Provider/Extender: [name] [name] in Treatment: 74 Information Obtained from: Patient Chief Complaint 08/04/2021; patient returns to clinic with bilateral leg wounds as well as areas on the right foot Electronic Signature(s) Signed: 10/05/2022 2:45:19 PM By: [name] Derry PA-C Entered By: [name] [name] on 10/05/2022 14:45:19 -------------------------------------------------------------------------------- HPI Details Patient Name: Date of Service: [name] [name], [name] [name] 10/05/2022 2:45 PM Medical Record Number: [id number] Patient Account Number: [id number] Date of Birth/Sex: Treating RN: Jun 26, 1955 (67 y.o. M) Primary Care Provider: Martha [name] Other Clinician: Referring Provider: Treating Provider/Extender: [name] [name] in Treatment: 42 History of Present Illness HPI Description: ADMISSION 03/22/2021 This is a 67 year old man with a past medical history significant for diabetes type 2, congestive heart failure, peripheral arterial disease, morbid obesity, venous insufficiency, and coronary artery disease. He has been followed by Dr. Loreta [name] in podiatry, who performed a transmetatarsal amputation on the left foot in August 2022. He had issues healing that wound, but based upon Dr. Kenna [name] notes, ultimately the TMA wound healed. During his recovery from that surgery, however, ulcers opened up over the DIP joint of the right second and third toe. These have apparently closed and reopened multiple times. It sounds like one of the issues has been  moisture accumulation and maceration of the tissues causing them to reopen. At his last visit with Dr. Loreta [name], on March 01, 2021, there continues to be problems with moisture and he was referred to wound care for further evaluation and management. He had a formal aortogram with runoff performed prior to his TMA. The findings are copied here: Patient has inline flow to both feet with no significant flow-limiting lesion that would be amenable to percutaneous or open revascularization. He does have an element of small vessel disease and has a short segment occlusion of the distal anterior tibial/dorsalis pedis artery on the left foot but does have posterior tibial artery flow. Would recommend management of wounds with amputation of toes 2 and 3 on the right foot if the wounds do not heal and deteriorate. Transmetatarsal amputation on the left side has as good a blood supply as it is going to get and hopefully this will heal in the future. Formal ABIs were done in January 2023. They are normal bilaterally. ABI Findings: +---------+------------------+-----+----------+--------+ Right Rt Pressure (mmHg)IndexWaveform Comment  +---------+------------------+-----+----------+--------+ Brachial 160     +---------+------------------+-----+----------+--------+ PTA 192 1.06 monophasic  +---------+------------------+-----+----------+--------+ DP 159 0.88 monophasic  +---------+------------------+-----+----------+--------+ Great T oe145 0.80    +---------+------------------+-----+----------+--------+ +--------+------------------+-----+---------+-------+ [name] ([id number]) [id number] Page 2 of 12 Left Lt Pressure (mmHg)IndexWaveform Comment +--------+------------------+-----+---------+-------+ [id number]     +--------+------------------+-----+---------+-------+ PTA 204 1.13 triphasic   +--------+------------------+-----+---------+-------+ DP 194 1.07 biphasic   +--------+------------------+-----+---------+-------+ +-------+-----------+-----------+------------+------------+ ABI/TBIT oday's ABIT oday's TBIPrevious ABIPrevious TBI +-------+-----------+-----------+------------+------------+ Right 1.06 0.8 1.26 0.65  +-------+-----------+-----------+------------+------------+ Left 1.13 amputation 1.15 amputation  +-------+-----------+-----------+------------+------------+ Previous ABI on 08/06/20 at [facility] Pedal pressures falsely elevated due to medial calcification. Summary: Right: Resting right ankle-brachial index is within normal range. The right toe-brachial index is normal. Left: Resting left ankle-brachial index is within normal range. READMISSION 08/04/2021 [name] [name] is now a 67 year old man who I remember from this clinic many years ago I  Apligraf applied 03/16/2022 #4 Apligraf applied 03/30/2022 HOLD APLIGRAF this week. Cellular or Tissue Based Product applied to wound bed, secured with steri-strips, cover with Adaptic or Mepitel. (DO NOT REMOVE). Bathing/ Shower/ Hygiene: May shower with protection but do not get wound dressing(s) wet. Protect dressing(s) with water repellant cover (for example, large plastic bag) or a cast cover and may then take shower. Edema Control - Lymphedema / SCD / Other: Elevate legs to the level of the heart or above for 30 minutes daily and/or when sitting for 3-4 times a day throughout the day. Avoid standing for long periods of time. Patient to wear own compression stockings every day. - apply in the morning and remove at night left leg. Exercise regularly Moisturize legs daily. - apply every night before bed. Compression stocking or Garment 30-40 mm/Hg pressure to: - wear your VIVE compression stocking to left leg. apply in the morning and remove at night. WOUND #7: - Malleolus Wound Laterality: Right, Medial Cleanser: Soap and Water 1 x Per Week/30 Days Discharge Instructions: May shower and wash wound with dial antibacterial soap and water prior to dressing change. Cleanser: Vashe 5.8 (oz) 1 x Per Week/30 Days Discharge Instructions: Cleanse the wound with Vashe prior to applying a clean dressing using gauze sponges, not tissue or cotton balls. Peri-Wound Care: Sween Lotion (Moisturizing lotion) 1 x Per Week/30 Days Discharge  Instructions: Apply moisturizing lotion as directed Topical: Gentamicin 1 x Per Week/30 Days Discharge Instructions: As directed by physician Prim Dressing: Endoform 2x2 in (Dispense As Written) 1 x Per Week/30 Days ary Discharge Instructions: Moisten with saline Secondary Dressing: CarboFLEX Odor Control Dressing, 4x4 in 1 x Per Week/30 Days Discharge Instructions: Apply over primary dressing as directed. Secondary Dressing: Drawtex 4x4 in 1 x Per Week/30 Days Discharge Instructions: Apply over primary dressing as directed. Secondary Dressing: Zetuvit Plus 4x8 in 1 x Per Week/30 Days Discharge Instructions: Apply over primary dressing as directed. Com pression Wrap: Urgo K2, (equivalent to a 4 layer) two layer compression system, regular 1 x Per Week/30 Days Discharge Instructions: Apply Urgo K2 as directed (alternative to 4 layer compression). 1. I would recommend that we have the patient continue with the wound care measures as before. We are using a little bit of the gentamicin down first followed by doing the endoform which I think is really doing a great job here. He has been using the Coverflex of the drawtex which seems to be doing excellent in the 4-layer compression wrap. 2. I am going to recommend as well he continue to elevate his legs much as possible to help with edema control more that he can do the better. We will see patient back for reevaluation in 1 week here in the clinic. If anything worsens or changes patient will contact our office for additional recommendations. Electronic Signature(s) Signed: 10/05/2022 3:48:05 PM By: [name] Derry PA-C Entered By: [name] [name] on 10/05/2022 15:48:05 -------------------------------------------------------------------------------- SuperBill Details Patient Name: Date of Service: [name] [name], [name] [name] 10/05/2022 Medical Record Number: [id number] Patient Account Number: [id number] Date of Birth/Sex: Treating RN: [birth year] (67 y.o. [name] Primary Care Provider: Martha [name] Other Clinician: Referring Provider: Treating Provider/Extender: [name] [name] in Treatment: [address] Diagnosis Coding ICD-10 Codes Code Description [name] [name], [name] [name] ([id number]) [id number] Page 12 of 12 I89.0 Lymphedema, not elsewhere classified I87.333 Chronic venous hypertension (idiopathic) with ulcer and inflammation of bilateral lower extremity L97.828 Non-pressure chronic ulcer of other part of left lower leg with other  Apligraf applied 03/16/2022 #4 Apligraf applied 03/30/2022 HOLD APLIGRAF this week. Cellular or Tissue Based Product applied to wound bed, secured with steri-strips, cover with Adaptic or Mepitel. (DO NOT REMOVE). Bathing/ Shower/ Hygiene: May shower with protection but do not get wound dressing(s) wet. Protect dressing(s) with water repellant cover (for example, large plastic bag) or a cast cover and may then take shower. Edema Control - Lymphedema / SCD / Other: Elevate legs to the level of the heart or above for 30 minutes daily and/or when sitting for 3-4 times a day throughout the day. Avoid standing for long periods of time. Patient to wear own compression stockings every day. - apply in the morning and remove at night left leg. Exercise regularly Moisturize legs daily. - apply every night before bed. Compression stocking or Garment 30-40 mm/Hg pressure to: - wear your VIVE compression stocking to left leg. apply in the morning and remove at night. WOUND #7: - Malleolus Wound Laterality: Right, Medial Cleanser: Soap and Water 1 x Per Week/30 Days Discharge Instructions: May shower and wash wound with dial antibacterial soap and water prior to dressing change. Cleanser: Vashe 5.8 (oz) 1 x Per Week/30 Days Discharge Instructions: Cleanse the wound with Vashe prior to applying a clean dressing using gauze sponges, not tissue or cotton balls. Peri-Wound Care: Sween Lotion (Moisturizing lotion) 1 x Per Week/30 Days Discharge  Instructions: Apply moisturizing lotion as directed Topical: Gentamicin 1 x Per Week/30 Days Discharge Instructions: As directed by physician Prim Dressing: Endoform 2x2 in (Dispense As Written) 1 x Per Week/30 Days ary Discharge Instructions: Moisten with saline Secondary Dressing: CarboFLEX Odor Control Dressing, 4x4 in 1 x Per Week/30 Days Discharge Instructions: Apply over primary dressing as directed. Secondary Dressing: Drawtex 4x4 in 1 x Per Week/30 Days Discharge Instructions: Apply over primary dressing as directed. Secondary Dressing: Zetuvit Plus 4x8 in 1 x Per Week/30 Days Discharge Instructions: Apply over primary dressing as directed. Com pression Wrap: Urgo K2, (equivalent to a 4 layer) two layer compression system, regular 1 x Per Week/30 Days Discharge Instructions: Apply Urgo K2 as directed (alternative to 4 layer compression). 1. I would recommend that we have the patient continue with the wound care measures as before. We are using a little bit of the gentamicin down first followed by doing the endoform which I think is really doing a great job here. He has been using the Coverflex of the drawtex which seems to be doing excellent in the 4-layer compression wrap. 2. I am going to recommend as well he continue to elevate his legs much as possible to help with edema control more that he can do the better. We will see patient back for reevaluation in 1 week here in the clinic. If anything worsens or changes patient will contact our office for additional recommendations. Electronic Signature(s) Signed: 10/05/2022 3:48:05 PM By: [name] Derry PA-C Entered By: [name] [name] on 10/05/2022 15:48:05 -------------------------------------------------------------------------------- SuperBill Details Patient Name: Date of Service: [name] [name], [name] [name] 10/05/2022 Medical Record Number: [id number] Patient Account Number: [id number] Date of Birth/Sex: Treating RN: [birth year] (67 y.o. [name] Primary Care Provider: Martha [name] Other Clinician: Referring Provider: Treating Provider/Extender: [name] [name] in Treatment: [address] Diagnosis Coding ICD-10 Codes Code Description [name] [name], [name] [name] ([id number]) [id number] Page 12 of 12 I89.0 Lymphedema, not elsewhere classified I87.333 Chronic venous hypertension (idiopathic) with ulcer and inflammation of bilateral lower extremity L97.828 Non-pressure chronic ulcer of other part of left lower leg with other  Apligraf applied 03/16/2022 #4 Apligraf applied 03/30/2022 HOLD APLIGRAF this week. Cellular or Tissue Based Product applied to wound bed, secured with steri-strips, cover with Adaptic or Mepitel. (DO NOT REMOVE). Bathing/ Shower/ Hygiene: May shower with protection but do not get wound dressing(s) wet. Protect dressing(s) with water repellant cover (for example, large plastic bag) or a cast cover and may then take shower. Edema Control - Lymphedema / SCD / Other: Elevate legs to the level of the heart or above for 30 minutes daily and/or when sitting for 3-4 times a day throughout the day. Avoid standing for long periods of time. Patient to wear own compression stockings every day. - apply in the morning and remove at night left leg. Exercise regularly Moisturize legs daily. - apply every night before bed. Compression stocking or Garment 30-40 mm/Hg pressure to: - wear your VIVE compression stocking to left leg. apply in the morning and remove at night. WOUND #7: - Malleolus Wound Laterality: Right, Medial Cleanser: Soap and Water 1 x Per Week/30 Days Discharge Instructions: May shower and wash wound with dial antibacterial soap and water prior to dressing change. Cleanser: Vashe 5.8 (oz) 1 x Per Week/30 Days Discharge Instructions: Cleanse the wound with Vashe prior to applying a clean dressing using gauze sponges, not tissue or cotton balls. Peri-Wound Care: Sween Lotion (Moisturizing lotion) 1 x Per Week/30 Days Discharge  Instructions: Apply moisturizing lotion as directed Topical: Gentamicin 1 x Per Week/30 Days Discharge Instructions: As directed by physician Prim Dressing: Endoform 2x2 in (Dispense As Written) 1 x Per Week/30 Days ary Discharge Instructions: Moisten with saline Secondary Dressing: CarboFLEX Odor Control Dressing, 4x4 in 1 x Per Week/30 Days Discharge Instructions: Apply over primary dressing as directed. Secondary Dressing: Drawtex 4x4 in 1 x Per Week/30 Days Discharge Instructions: Apply over primary dressing as directed. Secondary Dressing: Zetuvit Plus 4x8 in 1 x Per Week/30 Days Discharge Instructions: Apply over primary dressing as directed. Com pression Wrap: Urgo K2, (equivalent to a 4 layer) two layer compression system, regular 1 x Per Week/30 Days Discharge Instructions: Apply Urgo K2 as directed (alternative to 4 layer compression). 1. I would recommend that we have the patient continue with the wound care measures as before. We are using a little bit of the gentamicin down first followed by doing the endoform which I think is really doing a great job here. He has been using the Coverflex of the drawtex which seems to be doing excellent in the 4-layer compression wrap. 2. I am going to recommend as well he continue to elevate his legs much as possible to help with edema control more that he can do the better. We will see patient back for reevaluation in 1 week here in the clinic. If anything worsens or changes patient will contact our office for additional recommendations. Electronic Signature(s) Signed: 10/05/2022 3:48:05 PM By: [name] Derry PA-C Entered By: [name] [name] on 10/05/2022 15:48:05 -------------------------------------------------------------------------------- SuperBill Details Patient Name: Date of Service: [name] [name], [name] [name] 10/05/2022 Medical Record Number: [id number] Patient Account Number: [id number] Date of Birth/Sex: Treating RN: [birth year] (67 y.o. [name] Primary Care Provider: Martha [name] Other Clinician: Referring Provider: Treating Provider/Extender: [name] [name] in Treatment: [address] Diagnosis Coding ICD-10 Codes Code Description [name] [name], [name] [name] ([id number]) [id number] Page 12 of 12 I89.0 Lymphedema, not elsewhere classified I87.333 Chronic venous hypertension (idiopathic) with ulcer and inflammation of bilateral lower extremity L97.828 Non-pressure chronic ulcer of other part of left lower leg with other  Clinic Prior to debridement (In clinic) Topical Lidocaine 4% applied to wound bed Cellular or Tissue Based Products Cellular or Tissue Based Product Type: - Run IVR for Kerrecis- denied #1 Apligraf applied 02/23/2022 #2 Apligraf applied 03/02/2022 #3 Apligraf applied 03/16/2022 #4 Apligraf applied 03/30/2022 HOLD APLIGRAF this week. Cellular or Tissue Based Product applied to wound bed, secured with steri-strips, cover with Adaptic or Mepitel. (DO NOT REMOVE). Bathing/ Shower/ Hygiene May shower with protection but do not get wound dressing(s) wet. Protect dressing(s) with water repellant cover (for example, large plastic bag) or a cast cover and may then take shower. Edema Control -  Lymphedema / SCD / Other Elevate legs to the level of the heart or above for 30 minutes daily and/or when sitting for 3-4 times a day throughout the day. Avoid standing for long periods of time. Patient to wear own compression stockings every day. - apply in the morning and remove at night left leg. Exercise regularly Moisturize legs daily. - apply every night before bed. Compression stocking or Garment 30-40 mm/Hg pressure to: - wear your VIVE compression stocking to left leg. apply in the morning and remove at night. Wound Treatment Wound #7 - Malleolus Wound Laterality: Right, Medial Cleanser: Soap and Water 1 x Per Week/30 Days Discharge Instructions: May shower and wash wound with dial antibacterial soap and water prior to dressing change. Cleanser: Vashe 5.8 (oz) 1 x Per Week/30 Days Discharge Instructions: Cleanse the wound with Vashe prior to applying a clean dressing using gauze sponges, not tissue or cotton balls. Peri-Wound Care: Sween Lotion (Moisturizing lotion) 1 x Per Week/30 Days Discharge Instructions: Apply moisturizing lotion as directed Topical: Gentamicin 1 x Per Week/30 Days Discharge Instructions: As directed by physician [name] [name], [name] [name] ([id number]) [id number] Page 6 of 12 Prim Dressing: Endoform 2x2 in (Dispense As Written) 1 x Per Week/30 Days ary Discharge Instructions: Moisten with saline Secondary Dressing: CarboFLEX Odor Control Dressing, 4x4 in 1 x Per Week/30 Days Discharge Instructions: Apply over primary dressing as directed. Secondary Dressing: Drawtex 4x4 in 1 x Per Week/30 Days Discharge Instructions: Apply over primary dressing as directed. Secondary Dressing: Zetuvit Plus 4x8 in 1 x Per Week/30 Days Discharge Instructions: Apply over primary dressing as directed. Compression Wrap: Urgo K2, (equivalent to a 4 layer) two layer compression system, regular 1 x Per Week/30 Days Discharge Instructions: Apply Urgo K2 as directed  (alternative to 4 layer compression). Electronic Signature(s) Unsigned Entered By: Shawn Stall on 10/05/2022 15:12:43 -------------------------------------------------------------------------------- Problem List Details Patient Name: Date of Service: [name] [name], [name] [name] 10/05/2022 2:45 PM Medical Record Number: [id number] Patient Account Number: [id number] Date of Birth/Sex: Treating RN: [birth year] (67 y.o. M) Primary Care Provider: Martha [name] Other Clinician: Referring Provider: Treating Provider/Extender: [name] [name] in Treatment: 61 Active Problems ICD-10 Encounter Code Description Active Date MDM Diagnosis I89.0 Lymphedema, not elsewhere classified 08/04/2021 No Yes I87.333 Chronic venous hypertension (idiopathic) with ulcer and inflammation of 08/04/2021 No Yes bilateral lower extremity L97.828 Non-pressure chronic ulcer of other part of left lower leg with other specified 08/04/2021 No Yes severity L97.818 Non-pressure chronic ulcer of other part of right lower leg with other specified 08/04/2021 No Yes severity E11.621 Type 2 diabetes mellitus with foot ulcer 08/04/2021 No Yes L97.518 Non-pressure chronic ulcer of other part of right foot with other specified 08/04/2021 No Yes severity Inactive Problems Robey, [name] [name] ([id number]) [id number] Page 7 of 12 Resolved Problems Electronic Signature(s) Signed: 10/05/2022 2:45:13 PM By: [name] Derry PA-C Entered  [name] [name], [name] [name] ([id number]) [id number] Page 1 of 12 Visit Report for 10/05/2022 Chief Complaint Document Details Patient Name: Date of Service: [name] [name], [name] [name] 10/05/2022 2:45 PM Medical Record Number: [id number] Patient Account Number: [id number] Date of Birth/Sex: Treating RN: 1955-07-12 (67 y.o. M) Primary Care Provider: Martha [name] Other Clinician: Referring Provider: Treating Provider/Extender: [name] [name] in Treatment: 74 Information Obtained from: Patient Chief Complaint 08/04/2021; patient returns to clinic with bilateral leg wounds as well as areas on the right foot Electronic Signature(s) Signed: 10/05/2022 2:45:19 PM By: [name] Derry PA-C Entered By: [name] [name] on 10/05/2022 14:45:19 -------------------------------------------------------------------------------- HPI Details Patient Name: Date of Service: [name] [name], [name] [name] 10/05/2022 2:45 PM Medical Record Number: [id number] Patient Account Number: [id number] Date of Birth/Sex: Treating RN: Jun 26, 1955 (67 y.o. M) Primary Care Provider: Martha [name] Other Clinician: Referring Provider: Treating Provider/Extender: [name] [name] in Treatment: 42 History of Present Illness HPI Description: ADMISSION 03/22/2021 This is a 67 year old man with a past medical history significant for diabetes type 2, congestive heart failure, peripheral arterial disease, morbid obesity, venous insufficiency, and coronary artery disease. He has been followed by Dr. Loreta [name] in podiatry, who performed a transmetatarsal amputation on the left foot in August 2022. He had issues healing that wound, but based upon Dr. Kenna [name] notes, ultimately the TMA wound healed. During his recovery from that surgery, however, ulcers opened up over the DIP joint of the right second and third toe. These have apparently closed and reopened multiple times. It sounds like one of the issues has been  moisture accumulation and maceration of the tissues causing them to reopen. At his last visit with Dr. Loreta [name], on March 01, 2021, there continues to be problems with moisture and he was referred to wound care for further evaluation and management. He had a formal aortogram with runoff performed prior to his TMA. The findings are copied here: Patient has inline flow to both feet with no significant flow-limiting lesion that would be amenable to percutaneous or open revascularization. He does have an element of small vessel disease and has a short segment occlusion of the distal anterior tibial/dorsalis pedis artery on the left foot but does have posterior tibial artery flow. Would recommend management of wounds with amputation of toes 2 and 3 on the right foot if the wounds do not heal and deteriorate. Transmetatarsal amputation on the left side has as good a blood supply as it is going to get and hopefully this will heal in the future. Formal ABIs were done in January 2023. They are normal bilaterally. ABI Findings: +---------+------------------+-----+----------+--------+ Right Rt Pressure (mmHg)IndexWaveform Comment  +---------+------------------+-----+----------+--------+ Brachial 160     +---------+------------------+-----+----------+--------+ PTA 192 1.06 monophasic  +---------+------------------+-----+----------+--------+ DP 159 0.88 monophasic  +---------+------------------+-----+----------+--------+ Great T oe145 0.80    +---------+------------------+-----+----------+--------+ +--------+------------------+-----+---------+-------+ [name] ([id number]) [id number] Page 2 of 12 Left Lt Pressure (mmHg)IndexWaveform Comment +--------+------------------+-----+---------+-------+ [id number]     +--------+------------------+-----+---------+-------+ PTA 204 1.13 triphasic   +--------+------------------+-----+---------+-------+ DP 194 1.07 biphasic   +--------+------------------+-----+---------+-------+ +-------+-----------+-----------+------------+------------+ ABI/TBIT oday's ABIT oday's TBIPrevious ABIPrevious TBI +-------+-----------+-----------+------------+------------+ Right 1.06 0.8 1.26 0.65  +-------+-----------+-----------+------------+------------+ Left 1.13 amputation 1.15 amputation  +-------+-----------+-----------+------------+------------+ Previous ABI on 08/06/20 at [facility] Pedal pressures falsely elevated due to medial calcification. Summary: Right: Resting right ankle-brachial index is within normal range. The right toe-brachial index is normal. Left: Resting left ankle-brachial index is within normal range. READMISSION 08/04/2021 [name] [name] is now a 67 year old man who I remember from this clinic many years ago I  Clinic Prior to debridement (In clinic) Topical Lidocaine 4% applied to wound bed Cellular or Tissue Based Products Cellular or Tissue Based Product Type: - Run IVR for Kerrecis- denied #1 Apligraf applied 02/23/2022 #2 Apligraf applied 03/02/2022 #3 Apligraf applied 03/16/2022 #4 Apligraf applied 03/30/2022 HOLD APLIGRAF this week. Cellular or Tissue Based Product applied to wound bed, secured with steri-strips, cover with Adaptic or Mepitel. (DO NOT REMOVE). Bathing/ Shower/ Hygiene May shower with protection but do not get wound dressing(s) wet. Protect dressing(s) with water repellant cover (for example, large plastic bag) or a cast cover and may then take shower. Edema Control -  Lymphedema / SCD / Other Elevate legs to the level of the heart or above for 30 minutes daily and/or when sitting for 3-4 times a day throughout the day. Avoid standing for long periods of time. Patient to wear own compression stockings every day. - apply in the morning and remove at night left leg. Exercise regularly Moisturize legs daily. - apply every night before bed. Compression stocking or Garment 30-40 mm/Hg pressure to: - wear your VIVE compression stocking to left leg. apply in the morning and remove at night. Wound Treatment Wound #7 - Malleolus Wound Laterality: Right, Medial Cleanser: Soap and Water 1 x Per Week/30 Days Discharge Instructions: May shower and wash wound with dial antibacterial soap and water prior to dressing change. Cleanser: Vashe 5.8 (oz) 1 x Per Week/30 Days Discharge Instructions: Cleanse the wound with Vashe prior to applying a clean dressing using gauze sponges, not tissue or cotton balls. Peri-Wound Care: Sween Lotion (Moisturizing lotion) 1 x Per Week/30 Days Discharge Instructions: Apply moisturizing lotion as directed Topical: Gentamicin 1 x Per Week/30 Days Discharge Instructions: As directed by physician [name] [name], [name] [name] ([id number]) [id number] Page 6 of 12 Prim Dressing: Endoform 2x2 in (Dispense As Written) 1 x Per Week/30 Days ary Discharge Instructions: Moisten with saline Secondary Dressing: CarboFLEX Odor Control Dressing, 4x4 in 1 x Per Week/30 Days Discharge Instructions: Apply over primary dressing as directed. Secondary Dressing: Drawtex 4x4 in 1 x Per Week/30 Days Discharge Instructions: Apply over primary dressing as directed. Secondary Dressing: Zetuvit Plus 4x8 in 1 x Per Week/30 Days Discharge Instructions: Apply over primary dressing as directed. Compression Wrap: Urgo K2, (equivalent to a 4 layer) two layer compression system, regular 1 x Per Week/30 Days Discharge Instructions: Apply Urgo K2 as directed  (alternative to 4 layer compression). Electronic Signature(s) Unsigned Entered By: Shawn Stall on 10/05/2022 15:12:43 -------------------------------------------------------------------------------- Problem List Details Patient Name: Date of Service: [name] [name], [name] [name] 10/05/2022 2:45 PM Medical Record Number: [id number] Patient Account Number: [id number] Date of Birth/Sex: Treating RN: [birth year] (67 y.o. M) Primary Care Provider: Martha [name] Other Clinician: Referring Provider: Treating Provider/Extender: [name] [name] in Treatment: 61 Active Problems ICD-10 Encounter Code Description Active Date MDM Diagnosis I89.0 Lymphedema, not elsewhere classified 08/04/2021 No Yes I87.333 Chronic venous hypertension (idiopathic) with ulcer and inflammation of 08/04/2021 No Yes bilateral lower extremity L97.828 Non-pressure chronic ulcer of other part of left lower leg with other specified 08/04/2021 No Yes severity L97.818 Non-pressure chronic ulcer of other part of right lower leg with other specified 08/04/2021 No Yes severity E11.621 Type 2 diabetes mellitus with foot ulcer 08/04/2021 No Yes L97.518 Non-pressure chronic ulcer of other part of right foot with other specified 08/04/2021 No Yes severity Inactive Problems Robey, [name] [name] ([id number]) [id number] Page 7 of 12 Resolved Problems Electronic Signature(s) Signed: 10/05/2022 2:45:13 PM By: [name] Derry PA-C Entered  Apligraf applied 03/16/2022 #4 Apligraf applied 03/30/2022 HOLD APLIGRAF this week. Cellular or Tissue Based Product applied to wound bed, secured with steri-strips, cover with Adaptic or Mepitel. (DO NOT REMOVE). Bathing/ Shower/ Hygiene: May shower with protection but do not get wound dressing(s) wet. Protect dressing(s) with water repellant cover (for example, large plastic bag) or a cast cover and may then take shower. Edema Control - Lymphedema / SCD / Other: Elevate legs to the level of the heart or above for 30 minutes daily and/or when sitting for 3-4 times a day throughout the day. Avoid standing for long periods of time. Patient to wear own compression stockings every day. - apply in the morning and remove at night left leg. Exercise regularly Moisturize legs daily. - apply every night before bed. Compression stocking or Garment 30-40 mm/Hg pressure to: - wear your VIVE compression stocking to left leg. apply in the morning and remove at night. WOUND #7: - Malleolus Wound Laterality: Right, Medial Cleanser: Soap and Water 1 x Per Week/30 Days Discharge Instructions: May shower and wash wound with dial antibacterial soap and water prior to dressing change. Cleanser: Vashe 5.8 (oz) 1 x Per Week/30 Days Discharge Instructions: Cleanse the wound with Vashe prior to applying a clean dressing using gauze sponges, not tissue or cotton balls. Peri-Wound Care: Sween Lotion (Moisturizing lotion) 1 x Per Week/30 Days Discharge  Instructions: Apply moisturizing lotion as directed Topical: Gentamicin 1 x Per Week/30 Days Discharge Instructions: As directed by physician Prim Dressing: Endoform 2x2 in (Dispense As Written) 1 x Per Week/30 Days ary Discharge Instructions: Moisten with saline Secondary Dressing: CarboFLEX Odor Control Dressing, 4x4 in 1 x Per Week/30 Days Discharge Instructions: Apply over primary dressing as directed. Secondary Dressing: Drawtex 4x4 in 1 x Per Week/30 Days Discharge Instructions: Apply over primary dressing as directed. Secondary Dressing: Zetuvit Plus 4x8 in 1 x Per Week/30 Days Discharge Instructions: Apply over primary dressing as directed. Com pression Wrap: Urgo K2, (equivalent to a 4 layer) two layer compression system, regular 1 x Per Week/30 Days Discharge Instructions: Apply Urgo K2 as directed (alternative to 4 layer compression). 1. I would recommend that we have the patient continue with the wound care measures as before. We are using a little bit of the gentamicin down first followed by doing the endoform which I think is really doing a great job here. He has been using the Coverflex of the drawtex which seems to be doing excellent in the 4-layer compression wrap. 2. I am going to recommend as well he continue to elevate his legs much as possible to help with edema control more that he can do the better. We will see patient back for reevaluation in 1 week here in the clinic. If anything worsens or changes patient will contact our office for additional recommendations. Electronic Signature(s) Signed: 10/05/2022 3:48:05 PM By: [name] Derry PA-C Entered By: [name] [name] on 10/05/2022 15:48:05 -------------------------------------------------------------------------------- SuperBill Details Patient Name: Date of Service: [name] [name], [name] [name] 10/05/2022 Medical Record Number: [id number] Patient Account Number: [id number] Date of Birth/Sex: Treating RN: [birth year] (67 y.o. [name] Primary Care Provider: Martha [name] Other Clinician: Referring Provider: Treating Provider/Extender: [name] [name] in Treatment: [address] Diagnosis Coding ICD-10 Codes Code Description [name] [name], [name] [name] ([id number]) [id number] Page 12 of 12 I89.0 Lymphedema, not elsewhere classified I87.333 Chronic venous hypertension (idiopathic) with ulcer and inflammation of bilateral lower extremity L97.828 Non-pressure chronic ulcer of other part of left lower leg with other  Clinic Prior to debridement (In clinic) Topical Lidocaine 4% applied to wound bed Cellular or Tissue Based Products Cellular or Tissue Based Product Type: - Run IVR for Kerrecis- denied #1 Apligraf applied 02/23/2022 #2 Apligraf applied 03/02/2022 #3 Apligraf applied 03/16/2022 #4 Apligraf applied 03/30/2022 HOLD APLIGRAF this week. Cellular or Tissue Based Product applied to wound bed, secured with steri-strips, cover with Adaptic or Mepitel. (DO NOT REMOVE). Bathing/ Shower/ Hygiene May shower with protection but do not get wound dressing(s) wet. Protect dressing(s) with water repellant cover (for example, large plastic bag) or a cast cover and may then take shower. Edema Control -  Lymphedema / SCD / Other Elevate legs to the level of the heart or above for 30 minutes daily and/or when sitting for 3-4 times a day throughout the day. Avoid standing for long periods of time. Patient to wear own compression stockings every day. - apply in the morning and remove at night left leg. Exercise regularly Moisturize legs daily. - apply every night before bed. Compression stocking or Garment 30-40 mm/Hg pressure to: - wear your VIVE compression stocking to left leg. apply in the morning and remove at night. Wound Treatment Wound #7 - Malleolus Wound Laterality: Right, Medial Cleanser: Soap and Water 1 x Per Week/30 Days Discharge Instructions: May shower and wash wound with dial antibacterial soap and water prior to dressing change. Cleanser: Vashe 5.8 (oz) 1 x Per Week/30 Days Discharge Instructions: Cleanse the wound with Vashe prior to applying a clean dressing using gauze sponges, not tissue or cotton balls. Peri-Wound Care: Sween Lotion (Moisturizing lotion) 1 x Per Week/30 Days Discharge Instructions: Apply moisturizing lotion as directed Topical: Gentamicin 1 x Per Week/30 Days Discharge Instructions: As directed by physician [name] [name], [name] [name] ([id number]) [id number] Page 6 of 12 Prim Dressing: Endoform 2x2 in (Dispense As Written) 1 x Per Week/30 Days ary Discharge Instructions: Moisten with saline Secondary Dressing: CarboFLEX Odor Control Dressing, 4x4 in 1 x Per Week/30 Days Discharge Instructions: Apply over primary dressing as directed. Secondary Dressing: Drawtex 4x4 in 1 x Per Week/30 Days Discharge Instructions: Apply over primary dressing as directed. Secondary Dressing: Zetuvit Plus 4x8 in 1 x Per Week/30 Days Discharge Instructions: Apply over primary dressing as directed. Compression Wrap: Urgo K2, (equivalent to a 4 layer) two layer compression system, regular 1 x Per Week/30 Days Discharge Instructions: Apply Urgo K2 as directed  (alternative to 4 layer compression). Electronic Signature(s) Unsigned Entered By: Shawn Stall on 10/05/2022 15:12:43 -------------------------------------------------------------------------------- Problem List Details Patient Name: Date of Service: [name] [name], [name] [name] 10/05/2022 2:45 PM Medical Record Number: [id number] Patient Account Number: [id number] Date of Birth/Sex: Treating RN: [birth year] (67 y.o. M) Primary Care Provider: Martha [name] Other Clinician: Referring Provider: Treating Provider/Extender: [name] [name] in Treatment: 61 Active Problems ICD-10 Encounter Code Description Active Date MDM Diagnosis I89.0 Lymphedema, not elsewhere classified 08/04/2021 No Yes I87.333 Chronic venous hypertension (idiopathic) with ulcer and inflammation of 08/04/2021 No Yes bilateral lower extremity L97.828 Non-pressure chronic ulcer of other part of left lower leg with other specified 08/04/2021 No Yes severity L97.818 Non-pressure chronic ulcer of other part of right lower leg with other specified 08/04/2021 No Yes severity E11.621 Type 2 diabetes mellitus with foot ulcer 08/04/2021 No Yes L97.518 Non-pressure chronic ulcer of other part of right foot with other specified 08/04/2021 No Yes severity Inactive Problems Robey, [name] [name] ([id number]) [id number] Page 7 of 12 Resolved Problems Electronic Signature(s) Signed: 10/05/2022 2:45:13 PM By: [name] Derry PA-C Entered  Apligraf applied 03/16/2022 #4 Apligraf applied 03/30/2022 HOLD APLIGRAF this week. Cellular or Tissue Based Product applied to wound bed, secured with steri-strips, cover with Adaptic or Mepitel. (DO NOT REMOVE). Bathing/ Shower/ Hygiene: May shower with protection but do not get wound dressing(s) wet. Protect dressing(s) with water repellant cover (for example, large plastic bag) or a cast cover and may then take shower. Edema Control - Lymphedema / SCD / Other: Elevate legs to the level of the heart or above for 30 minutes daily and/or when sitting for 3-4 times a day throughout the day. Avoid standing for long periods of time. Patient to wear own compression stockings every day. - apply in the morning and remove at night left leg. Exercise regularly Moisturize legs daily. - apply every night before bed. Compression stocking or Garment 30-40 mm/Hg pressure to: - wear your VIVE compression stocking to left leg. apply in the morning and remove at night. WOUND #7: - Malleolus Wound Laterality: Right, Medial Cleanser: Soap and Water 1 x Per Week/30 Days Discharge Instructions: May shower and wash wound with dial antibacterial soap and water prior to dressing change. Cleanser: Vashe 5.8 (oz) 1 x Per Week/30 Days Discharge Instructions: Cleanse the wound with Vashe prior to applying a clean dressing using gauze sponges, not tissue or cotton balls. Peri-Wound Care: Sween Lotion (Moisturizing lotion) 1 x Per Week/30 Days Discharge  Instructions: Apply moisturizing lotion as directed Topical: Gentamicin 1 x Per Week/30 Days Discharge Instructions: As directed by physician Prim Dressing: Endoform 2x2 in (Dispense As Written) 1 x Per Week/30 Days ary Discharge Instructions: Moisten with saline Secondary Dressing: CarboFLEX Odor Control Dressing, 4x4 in 1 x Per Week/30 Days Discharge Instructions: Apply over primary dressing as directed. Secondary Dressing: Drawtex 4x4 in 1 x Per Week/30 Days Discharge Instructions: Apply over primary dressing as directed. Secondary Dressing: Zetuvit Plus 4x8 in 1 x Per Week/30 Days Discharge Instructions: Apply over primary dressing as directed. Com pression Wrap: Urgo K2, (equivalent to a 4 layer) two layer compression system, regular 1 x Per Week/30 Days Discharge Instructions: Apply Urgo K2 as directed (alternative to 4 layer compression). 1. I would recommend that we have the patient continue with the wound care measures as before. We are using a little bit of the gentamicin down first followed by doing the endoform which I think is really doing a great job here. He has been using the Coverflex of the drawtex which seems to be doing excellent in the 4-layer compression wrap. 2. I am going to recommend as well he continue to elevate his legs much as possible to help with edema control more that he can do the better. We will see patient back for reevaluation in 1 week here in the clinic. If anything worsens or changes patient will contact our office for additional recommendations. Electronic Signature(s) Signed: 10/05/2022 3:48:05 PM By: [name] Derry PA-C Entered By: [name] [name] on 10/05/2022 15:48:05 -------------------------------------------------------------------------------- SuperBill Details Patient Name: Date of Service: [name] [name], [name] [name] 10/05/2022 Medical Record Number: [id number] Patient Account Number: [id number] Date of Birth/Sex: Treating RN: [birth year] (67 y.o. [name] Primary Care Provider: Martha [name] Other Clinician: Referring Provider: Treating Provider/Extender: [name] [name] in Treatment: [address] Diagnosis Coding ICD-10 Codes Code Description [name] [name], [name] [name] ([id number]) [id number] Page 12 of 12 I89.0 Lymphedema, not elsewhere classified I87.333 Chronic venous hypertension (idiopathic) with ulcer and inflammation of bilateral lower extremity L97.828 Non-pressure chronic ulcer of other part of left lower leg with other  [name] [name], [name] [name] ([id number]) [id number] Page 1 of 12 Visit Report for 10/05/2022 Chief Complaint Document Details Patient Name: Date of Service: [name] [name], [name] [name] 10/05/2022 2:45 PM Medical Record Number: [id number] Patient Account Number: [id number] Date of Birth/Sex: Treating RN: 1955-07-12 (67 y.o. M) Primary Care Provider: Martha [name] Other Clinician: Referring Provider: Treating Provider/Extender: [name] [name] in Treatment: 74 Information Obtained from: Patient Chief Complaint 08/04/2021; patient returns to clinic with bilateral leg wounds as well as areas on the right foot Electronic Signature(s) Signed: 10/05/2022 2:45:19 PM By: [name] Derry PA-C Entered By: [name] [name] on 10/05/2022 14:45:19 -------------------------------------------------------------------------------- HPI Details Patient Name: Date of Service: [name] [name], [name] [name] 10/05/2022 2:45 PM Medical Record Number: [id number] Patient Account Number: [id number] Date of Birth/Sex: Treating RN: Jun 26, 1955 (67 y.o. M) Primary Care Provider: Martha [name] Other Clinician: Referring Provider: Treating Provider/Extender: [name] [name] in Treatment: 42 History of Present Illness HPI Description: ADMISSION 03/22/2021 This is a 67 year old man with a past medical history significant for diabetes type 2, congestive heart failure, peripheral arterial disease, morbid obesity, venous insufficiency, and coronary artery disease. He has been followed by Dr. Loreta [name] in podiatry, who performed a transmetatarsal amputation on the left foot in August 2022. He had issues healing that wound, but based upon Dr. Kenna [name] notes, ultimately the TMA wound healed. During his recovery from that surgery, however, ulcers opened up over the DIP joint of the right second and third toe. These have apparently closed and reopened multiple times. It sounds like one of the issues has been  moisture accumulation and maceration of the tissues causing them to reopen. At his last visit with Dr. Loreta [name], on March 01, 2021, there continues to be problems with moisture and he was referred to wound care for further evaluation and management. He had a formal aortogram with runoff performed prior to his TMA. The findings are copied here: Patient has inline flow to both feet with no significant flow-limiting lesion that would be amenable to percutaneous or open revascularization. He does have an element of small vessel disease and has a short segment occlusion of the distal anterior tibial/dorsalis pedis artery on the left foot but does have posterior tibial artery flow. Would recommend management of wounds with amputation of toes 2 and 3 on the right foot if the wounds do not heal and deteriorate. Transmetatarsal amputation on the left side has as good a blood supply as it is going to get and hopefully this will heal in the future. Formal ABIs were done in January 2023. They are normal bilaterally. ABI Findings: +---------+------------------+-----+----------+--------+ Right Rt Pressure (mmHg)IndexWaveform Comment  +---------+------------------+-----+----------+--------+ Brachial 160     +---------+------------------+-----+----------+--------+ PTA 192 1.06 monophasic  +---------+------------------+-----+----------+--------+ DP 159 0.88 monophasic  +---------+------------------+-----+----------+--------+ Great T oe145 0.80    +---------+------------------+-----+----------+--------+ +--------+------------------+-----+---------+-------+ [name] ([id number]) [id number] Page 2 of 12 Left Lt Pressure (mmHg)IndexWaveform Comment +--------+------------------+-----+---------+-------+ [id number]     +--------+------------------+-----+---------+-------+ PTA 204 1.13 triphasic   +--------+------------------+-----+---------+-------+ DP 194 1.07 biphasic   +--------+------------------+-----+---------+-------+ +-------+-----------+-----------+------------+------------+ ABI/TBIT oday's ABIT oday's TBIPrevious ABIPrevious TBI +-------+-----------+-----------+------------+------------+ Right 1.06 0.8 1.26 0.65  +-------+-----------+-----------+------------+------------+ Left 1.13 amputation 1.15 amputation  +-------+-----------+-----------+------------+------------+ Previous ABI on 08/06/20 at [facility] Pedal pressures falsely elevated due to medial calcification. Summary: Right: Resting right ankle-brachial index is within normal range. The right toe-brachial index is normal. Left: Resting left ankle-brachial index is within normal range. READMISSION 08/04/2021 [name] [name] is now a 67 year old man who I remember from this clinic many years ago I  Clinic Prior to debridement (In clinic) Topical Lidocaine 4% applied to wound bed Cellular or Tissue Based Products Cellular or Tissue Based Product Type: - Run IVR for Kerrecis- denied #1 Apligraf applied 02/23/2022 #2 Apligraf applied 03/02/2022 #3 Apligraf applied 03/16/2022 #4 Apligraf applied 03/30/2022 HOLD APLIGRAF this week. Cellular or Tissue Based Product applied to wound bed, secured with steri-strips, cover with Adaptic or Mepitel. (DO NOT REMOVE). Bathing/ Shower/ Hygiene May shower with protection but do not get wound dressing(s) wet. Protect dressing(s) with water repellant cover (for example, large plastic bag) or a cast cover and may then take shower. Edema Control -  Lymphedema / SCD / Other Elevate legs to the level of the heart or above for 30 minutes daily and/or when sitting for 3-4 times a day throughout the day. Avoid standing for long periods of time. Patient to wear own compression stockings every day. - apply in the morning and remove at night left leg. Exercise regularly Moisturize legs daily. - apply every night before bed. Compression stocking or Garment 30-40 mm/Hg pressure to: - wear your VIVE compression stocking to left leg. apply in the morning and remove at night. Wound Treatment Wound #7 - Malleolus Wound Laterality: Right, Medial Cleanser: Soap and Water 1 x Per Week/30 Days Discharge Instructions: May shower and wash wound with dial antibacterial soap and water prior to dressing change. Cleanser: Vashe 5.8 (oz) 1 x Per Week/30 Days Discharge Instructions: Cleanse the wound with Vashe prior to applying a clean dressing using gauze sponges, not tissue or cotton balls. Peri-Wound Care: Sween Lotion (Moisturizing lotion) 1 x Per Week/30 Days Discharge Instructions: Apply moisturizing lotion as directed Topical: Gentamicin 1 x Per Week/30 Days Discharge Instructions: As directed by physician [name] [name], [name] [name] ([id number]) [id number] Page 6 of 12 Prim Dressing: Endoform 2x2 in (Dispense As Written) 1 x Per Week/30 Days ary Discharge Instructions: Moisten with saline Secondary Dressing: CarboFLEX Odor Control Dressing, 4x4 in 1 x Per Week/30 Days Discharge Instructions: Apply over primary dressing as directed. Secondary Dressing: Drawtex 4x4 in 1 x Per Week/30 Days Discharge Instructions: Apply over primary dressing as directed. Secondary Dressing: Zetuvit Plus 4x8 in 1 x Per Week/30 Days Discharge Instructions: Apply over primary dressing as directed. Compression Wrap: Urgo K2, (equivalent to a 4 layer) two layer compression system, regular 1 x Per Week/30 Days Discharge Instructions: Apply Urgo K2 as directed  (alternative to 4 layer compression). Electronic Signature(s) Unsigned Entered By: Shawn Stall on 10/05/2022 15:12:43 -------------------------------------------------------------------------------- Problem List Details Patient Name: Date of Service: [name] [name], [name] [name] 10/05/2022 2:45 PM Medical Record Number: [id number] Patient Account Number: [id number] Date of Birth/Sex: Treating RN: [birth year] (67 y.o. M) Primary Care Provider: Martha [name] Other Clinician: Referring Provider: Treating Provider/Extender: [name] [name] in Treatment: 61 Active Problems ICD-10 Encounter Code Description Active Date MDM Diagnosis I89.0 Lymphedema, not elsewhere classified 08/04/2021 No Yes I87.333 Chronic venous hypertension (idiopathic) with ulcer and inflammation of 08/04/2021 No Yes bilateral lower extremity L97.828 Non-pressure chronic ulcer of other part of left lower leg with other specified 08/04/2021 No Yes severity L97.818 Non-pressure chronic ulcer of other part of right lower leg with other specified 08/04/2021 No Yes severity E11.621 Type 2 diabetes mellitus with foot ulcer 08/04/2021 No Yes L97.518 Non-pressure chronic ulcer of other part of right foot with other specified 08/04/2021 No Yes severity Inactive Problems Robey, [name] [name] ([id number]) [id number] Page 7 of 12 Resolved Problems Electronic Signature(s) Signed: 10/05/2022 2:45:13 PM By: [name] Derry PA-C Entered  Apligraf applied 03/16/2022 #4 Apligraf applied 03/30/2022 HOLD APLIGRAF this week. Cellular or Tissue Based Product applied to wound bed, secured with steri-strips, cover with Adaptic or Mepitel. (DO NOT REMOVE). Bathing/ Shower/ Hygiene: May shower with protection but do not get wound dressing(s) wet. Protect dressing(s) with water repellant cover (for example, large plastic bag) or a cast cover and may then take shower. Edema Control - Lymphedema / SCD / Other: Elevate legs to the level of the heart or above for 30 minutes daily and/or when sitting for 3-4 times a day throughout the day. Avoid standing for long periods of time. Patient to wear own compression stockings every day. - apply in the morning and remove at night left leg. Exercise regularly Moisturize legs daily. - apply every night before bed. Compression stocking or Garment 30-40 mm/Hg pressure to: - wear your VIVE compression stocking to left leg. apply in the morning and remove at night. WOUND #7: - Malleolus Wound Laterality: Right, Medial Cleanser: Soap and Water 1 x Per Week/30 Days Discharge Instructions: May shower and wash wound with dial antibacterial soap and water prior to dressing change. Cleanser: Vashe 5.8 (oz) 1 x Per Week/30 Days Discharge Instructions: Cleanse the wound with Vashe prior to applying a clean dressing using gauze sponges, not tissue or cotton balls. Peri-Wound Care: Sween Lotion (Moisturizing lotion) 1 x Per Week/30 Days Discharge  Instructions: Apply moisturizing lotion as directed Topical: Gentamicin 1 x Per Week/30 Days Discharge Instructions: As directed by physician Prim Dressing: Endoform 2x2 in (Dispense As Written) 1 x Per Week/30 Days ary Discharge Instructions: Moisten with saline Secondary Dressing: CarboFLEX Odor Control Dressing, 4x4 in 1 x Per Week/30 Days Discharge Instructions: Apply over primary dressing as directed. Secondary Dressing: Drawtex 4x4 in 1 x Per Week/30 Days Discharge Instructions: Apply over primary dressing as directed. Secondary Dressing: Zetuvit Plus 4x8 in 1 x Per Week/30 Days Discharge Instructions: Apply over primary dressing as directed. Com pression Wrap: Urgo K2, (equivalent to a 4 layer) two layer compression system, regular 1 x Per Week/30 Days Discharge Instructions: Apply Urgo K2 as directed (alternative to 4 layer compression). 1. I would recommend that we have the patient continue with the wound care measures as before. We are using a little bit of the gentamicin down first followed by doing the endoform which I think is really doing a great job here. He has been using the Coverflex of the drawtex which seems to be doing excellent in the 4-layer compression wrap. 2. I am going to recommend as well he continue to elevate his legs much as possible to help with edema control more that he can do the better. We will see patient back for reevaluation in 1 week here in the clinic. If anything worsens or changes patient will contact our office for additional recommendations. Electronic Signature(s) Signed: 10/05/2022 3:48:05 PM By: [name] Derry PA-C Entered By: [name] [name] on 10/05/2022 15:48:05 -------------------------------------------------------------------------------- SuperBill Details Patient Name: Date of Service: [name] [name], [name] [name] 10/05/2022 Medical Record Number: [id number] Patient Account Number: [id number] Date of Birth/Sex: Treating RN: [birth year] (67 y.o. [name] Primary Care Provider: Martha [name] Other Clinician: Referring Provider: Treating Provider/Extender: [name] [name] in Treatment: [address] Diagnosis Coding ICD-10 Codes Code Description [name] [name], [name] [name] ([id number]) [id number] Page 12 of 12 I89.0 Lymphedema, not elsewhere classified I87.333 Chronic venous hypertension (idiopathic) with ulcer and inflammation of bilateral lower extremity L97.828 Non-pressure chronic ulcer of other part of left lower leg with other  [name] [name], [name] [name] ([id number]) [id number] Page 1 of 12 Visit Report for 10/05/2022 Chief Complaint Document Details Patient Name: Date of Service: [name] [name], [name] [name] 10/05/2022 2:45 PM Medical Record Number: [id number] Patient Account Number: [id number] Date of Birth/Sex: Treating RN: 1955-07-12 (67 y.o. M) Primary Care Provider: Martha [name] Other Clinician: Referring Provider: Treating Provider/Extender: [name] [name] in Treatment: 74 Information Obtained from: Patient Chief Complaint 08/04/2021; patient returns to clinic with bilateral leg wounds as well as areas on the right foot Electronic Signature(s) Signed: 10/05/2022 2:45:19 PM By: [name] Derry PA-C Entered By: [name] [name] on 10/05/2022 14:45:19 -------------------------------------------------------------------------------- HPI Details Patient Name: Date of Service: [name] [name], [name] [name] 10/05/2022 2:45 PM Medical Record Number: [id number] Patient Account Number: [id number] Date of Birth/Sex: Treating RN: Jun 26, 1955 (67 y.o. M) Primary Care Provider: Martha [name] Other Clinician: Referring Provider: Treating Provider/Extender: [name] [name] in Treatment: 42 History of Present Illness HPI Description: ADMISSION 03/22/2021 This is a 67 year old man with a past medical history significant for diabetes type 2, congestive heart failure, peripheral arterial disease, morbid obesity, venous insufficiency, and coronary artery disease. He has been followed by Dr. Loreta [name] in podiatry, who performed a transmetatarsal amputation on the left foot in August 2022. He had issues healing that wound, but based upon Dr. Kenna [name] notes, ultimately the TMA wound healed. During his recovery from that surgery, however, ulcers opened up over the DIP joint of the right second and third toe. These have apparently closed and reopened multiple times. It sounds like one of the issues has been  moisture accumulation and maceration of the tissues causing them to reopen. At his last visit with Dr. Loreta [name], on March 01, 2021, there continues to be problems with moisture and he was referred to wound care for further evaluation and management. He had a formal aortogram with runoff performed prior to his TMA. The findings are copied here: Patient has inline flow to both feet with no significant flow-limiting lesion that would be amenable to percutaneous or open revascularization. He does have an element of small vessel disease and has a short segment occlusion of the distal anterior tibial/dorsalis pedis artery on the left foot but does have posterior tibial artery flow. Would recommend management of wounds with amputation of toes 2 and 3 on the right foot if the wounds do not heal and deteriorate. Transmetatarsal amputation on the left side has as good a blood supply as it is going to get and hopefully this will heal in the future. Formal ABIs were done in January 2023. They are normal bilaterally. ABI Findings: +---------+------------------+-----+----------+--------+ Right Rt Pressure (mmHg)IndexWaveform Comment  +---------+------------------+-----+----------+--------+ Brachial 160     +---------+------------------+-----+----------+--------+ PTA 192 1.06 monophasic  +---------+------------------+-----+----------+--------+ DP 159 0.88 monophasic  +---------+------------------+-----+----------+--------+ Great T oe145 0.80    +---------+------------------+-----+----------+--------+ +--------+------------------+-----+---------+-------+ [name] ([id number]) [id number] Page 2 of 12 Left Lt Pressure (mmHg)IndexWaveform Comment +--------+------------------+-----+---------+-------+ [id number]     +--------+------------------+-----+---------+-------+ PTA 204 1.13 triphasic   +--------+------------------+-----+---------+-------+ DP 194 1.07 biphasic   +--------+------------------+-----+---------+-------+ +-------+-----------+-----------+------------+------------+ ABI/TBIT oday's ABIT oday's TBIPrevious ABIPrevious TBI +-------+-----------+-----------+------------+------------+ Right 1.06 0.8 1.26 0.65  +-------+-----------+-----------+------------+------------+ Left 1.13 amputation 1.15 amputation  +-------+-----------+-----------+------------+------------+ Previous ABI on 08/06/20 at [facility] Pedal pressures falsely elevated due to medial calcification. Summary: Right: Resting right ankle-brachial index is within normal range. The right toe-brachial index is normal. Left: Resting left ankle-brachial index is within normal range. READMISSION 08/04/2021 [name] [name] is now a 67 year old man who I remember from this clinic many years ago I  Clinic Prior to debridement (In clinic) Topical Lidocaine 4% applied to wound bed Cellular or Tissue Based Products Cellular or Tissue Based Product Type: - Run IVR for Kerrecis- denied #1 Apligraf applied 02/23/2022 #2 Apligraf applied 03/02/2022 #3 Apligraf applied 03/16/2022 #4 Apligraf applied 03/30/2022 HOLD APLIGRAF this week. Cellular or Tissue Based Product applied to wound bed, secured with steri-strips, cover with Adaptic or Mepitel. (DO NOT REMOVE). Bathing/ Shower/ Hygiene May shower with protection but do not get wound dressing(s) wet. Protect dressing(s) with water repellant cover (for example, large plastic bag) or a cast cover and may then take shower. Edema Control -  Lymphedema / SCD / Other Elevate legs to the level of the heart or above for 30 minutes daily and/or when sitting for 3-4 times a day throughout the day. Avoid standing for long periods of time. Patient to wear own compression stockings every day. - apply in the morning and remove at night left leg. Exercise regularly Moisturize legs daily. - apply every night before bed. Compression stocking or Garment 30-40 mm/Hg pressure to: - wear your VIVE compression stocking to left leg. apply in the morning and remove at night. Wound Treatment Wound #7 - Malleolus Wound Laterality: Right, Medial Cleanser: Soap and Water 1 x Per Week/30 Days Discharge Instructions: May shower and wash wound with dial antibacterial soap and water prior to dressing change. Cleanser: Vashe 5.8 (oz) 1 x Per Week/30 Days Discharge Instructions: Cleanse the wound with Vashe prior to applying a clean dressing using gauze sponges, not tissue or cotton balls. Peri-Wound Care: Sween Lotion (Moisturizing lotion) 1 x Per Week/30 Days Discharge Instructions: Apply moisturizing lotion as directed Topical: Gentamicin 1 x Per Week/30 Days Discharge Instructions: As directed by physician [name] [name], [name] [name] ([id number]) [id number] Page 6 of 12 Prim Dressing: Endoform 2x2 in (Dispense As Written) 1 x Per Week/30 Days ary Discharge Instructions: Moisten with saline Secondary Dressing: CarboFLEX Odor Control Dressing, 4x4 in 1 x Per Week/30 Days Discharge Instructions: Apply over primary dressing as directed. Secondary Dressing: Drawtex 4x4 in 1 x Per Week/30 Days Discharge Instructions: Apply over primary dressing as directed. Secondary Dressing: Zetuvit Plus 4x8 in 1 x Per Week/30 Days Discharge Instructions: Apply over primary dressing as directed. Compression Wrap: Urgo K2, (equivalent to a 4 layer) two layer compression system, regular 1 x Per Week/30 Days Discharge Instructions: Apply Urgo K2 as directed  (alternative to 4 layer compression). Electronic Signature(s) Unsigned Entered By: Shawn Stall on 10/05/2022 15:12:43 -------------------------------------------------------------------------------- Problem List Details Patient Name: Date of Service: [name] [name], [name] [name] 10/05/2022 2:45 PM Medical Record Number: [id number] Patient Account Number: [id number] Date of Birth/Sex: Treating RN: [birth year] (67 y.o. M) Primary Care Provider: Martha [name] Other Clinician: Referring Provider: Treating Provider/Extender: [name] [name] in Treatment: 61 Active Problems ICD-10 Encounter Code Description Active Date MDM Diagnosis I89.0 Lymphedema, not elsewhere classified 08/04/2021 No Yes I87.333 Chronic venous hypertension (idiopathic) with ulcer and inflammation of 08/04/2021 No Yes bilateral lower extremity L97.828 Non-pressure chronic ulcer of other part of left lower leg with other specified 08/04/2021 No Yes severity L97.818 Non-pressure chronic ulcer of other part of right lower leg with other specified 08/04/2021 No Yes severity E11.621 Type 2 diabetes mellitus with foot ulcer 08/04/2021 No Yes L97.518 Non-pressure chronic ulcer of other part of right foot with other specified 08/04/2021 No Yes severity Inactive Problems Robey, [name] [name] ([id number]) [id number] Page 7 of 12 Resolved Problems Electronic Signature(s) Signed: 10/05/2022 2:45:13 PM By: [name] Derry PA-C Entered  [name] [name], [name] [name] ([id number]) [id number] Page 1 of 12 Visit Report for 10/05/2022 Chief Complaint Document Details Patient Name: Date of Service: [name] [name], [name] [name] 10/05/2022 2:45 PM Medical Record Number: [id number] Patient Account Number: [id number] Date of Birth/Sex: Treating RN: 1955-07-12 (67 y.o. M) Primary Care Provider: Martha [name] Other Clinician: Referring Provider: Treating Provider/Extender: [name] [name] in Treatment: 74 Information Obtained from: Patient Chief Complaint 08/04/2021; patient returns to clinic with bilateral leg wounds as well as areas on the right foot Electronic Signature(s) Signed: 10/05/2022 2:45:19 PM By: [name] Derry PA-C Entered By: [name] [name] on 10/05/2022 14:45:19 -------------------------------------------------------------------------------- HPI Details Patient Name: Date of Service: [name] [name], [name] [name] 10/05/2022 2:45 PM Medical Record Number: [id number] Patient Account Number: [id number] Date of Birth/Sex: Treating RN: Jun 26, 1955 (67 y.o. M) Primary Care Provider: Martha [name] Other Clinician: Referring Provider: Treating Provider/Extender: [name] [name] in Treatment: 42 History of Present Illness HPI Description: ADMISSION 03/22/2021 This is a 67 year old man with a past medical history significant for diabetes type 2, congestive heart failure, peripheral arterial disease, morbid obesity, venous insufficiency, and coronary artery disease. He has been followed by Dr. Loreta [name] in podiatry, who performed a transmetatarsal amputation on the left foot in August 2022. He had issues healing that wound, but based upon Dr. Kenna [name] notes, ultimately the TMA wound healed. During his recovery from that surgery, however, ulcers opened up over the DIP joint of the right second and third toe. These have apparently closed and reopened multiple times. It sounds like one of the issues has been  moisture accumulation and maceration of the tissues causing them to reopen. At his last visit with Dr. Loreta [name], on March 01, 2021, there continues to be problems with moisture and he was referred to wound care for further evaluation and management. He had a formal aortogram with runoff performed prior to his TMA. The findings are copied here: Patient has inline flow to both feet with no significant flow-limiting lesion that would be amenable to percutaneous or open revascularization. He does have an element of small vessel disease and has a short segment occlusion of the distal anterior tibial/dorsalis pedis artery on the left foot but does have posterior tibial artery flow. Would recommend management of wounds with amputation of toes 2 and 3 on the right foot if the wounds do not heal and deteriorate. Transmetatarsal amputation on the left side has as good a blood supply as it is going to get and hopefully this will heal in the future. Formal ABIs were done in January 2023. They are normal bilaterally. ABI Findings: +---------+------------------+-----+----------+--------+ Right Rt Pressure (mmHg)IndexWaveform Comment  +---------+------------------+-----+----------+--------+ Brachial 160     +---------+------------------+-----+----------+--------+ PTA 192 1.06 monophasic  +---------+------------------+-----+----------+--------+ DP 159 0.88 monophasic  +---------+------------------+-----+----------+--------+ Great T oe145 0.80    +---------+------------------+-----+----------+--------+ +--------+------------------+-----+---------+-------+ [name] ([id number]) [id number] Page 2 of 12 Left Lt Pressure (mmHg)IndexWaveform Comment +--------+------------------+-----+---------+-------+ [id number]     +--------+------------------+-----+---------+-------+ PTA 204 1.13 triphasic   +--------+------------------+-----+---------+-------+ DP 194 1.07 biphasic   +--------+------------------+-----+---------+-------+ +-------+-----------+-----------+------------+------------+ ABI/TBIT oday's ABIT oday's TBIPrevious ABIPrevious TBI +-------+-----------+-----------+------------+------------+ Right 1.06 0.8 1.26 0.65  +-------+-----------+-----------+------------+------------+ Left 1.13 amputation 1.15 amputation  +-------+-----------+-----------+------------+------------+ Previous ABI on 08/06/20 at [facility] Pedal pressures falsely elevated due to medial calcification. Summary: Right: Resting right ankle-brachial index is within normal range. The right toe-brachial index is normal. Left: Resting left ankle-brachial index is within normal range. READMISSION 08/04/2021 [name] [name] is now a 67 year old man who I remember from this clinic many years ago I

## 2022-10-06 NOTE — Progress Notes (Signed)
Skin Texture Texture Color No Abnormalities Noted: No No Abnormalities Noted: No Callus: Yes Atrophie Blanche: No Crepitus: No Cyanosis: No Excoriation: No Ecchymosis: No Induration: No Erythema: No Rash:  No Hemosiderin Staining: No Scarring: No Mottled: No Pallor: No Moisture Rubor: No No Abnormalities Noted: No Dry / Scaly: Yes Temperature / Pain Maceration: No Temperature: No Abnormality Treatment Notes Wound #7 (Malleolus) Wound Laterality: Right, Medial Cleanser Soap and Water Discharge Instruction: May shower and wash wound with dial antibacterial soap and water prior to dressing change. [name] [name] ([id number]) [id number] Page 6 of 6 Vashe 5.8 (oz) Discharge Instruction: Cleanse the wound with Vashe prior to applying a clean dressing using gauze sponges, not tissue or cotton balls. Peri-Wound Care Sween Lotion (Moisturizing lotion) Discharge Instruction: Apply moisturizing lotion as directed Topical Gentamicin Discharge Instruction: As directed by physician Primary Dressing Endoform 2x2 in Discharge Instruction: Moisten with saline Secondary Dressing CarboFLEX Odor Control Dressing, 4x4 in Discharge Instruction: Apply over primary dressing as directed. Drawtex 4x4 in Discharge Instruction: Apply over primary dressing as directed. Zetuvit Plus 4x8 in Discharge Instruction: Apply over primary dressing as directed. Secured With Compression Wrap Urgo K2, (equivalent to a 4 layer) two layer compression system, regular Discharge Instruction: Apply Urgo K2 as directed (alternative to 4 layer compression). Compression Stockings Add-Ons Electronic Signature(s) Signed: 10/05/2022 4:31:50 PM By: [name] Schwalbe RN Signed: 10/05/2022 7:24:00 PM By: [name] Stall RN, BSN Entered By: [name] [name] on 10/05/2022 12:10:03 -------------------------------------------------------------------------------- Vitals Details Patient Name: Date of Service: [name] [name], [name] [name]. 10/05/2022 2:45 PM Medical Record Number: [id number] Patient Account Number: [id number] Date of Birth/Sex: Treating RN: Jan 31, 1955 (67 y.o. M) Primary Care Summar [name] [name]: [name] [name] Other  Clinician: Referring [name] [name]: Treating [name] [name]/Extender: [name] [name] in Treatment: 61 Vital Signs Time Taken: 14:52 Temperature (F): 98.3 Height (in): 71 Pulse (bpm): 92 Weight (lbs): 350 Respiratory Rate (breaths/min): 18 Body Mass Index (BMI): 48.8 Blood Pressure (mmHg): 139/90 Reference Range: 80 - 120 mg / dl Electronic Signature(s) Signed: 10/05/2022 4:28:55 PM By: [name] [name] Entered By: [name] [name] on 10/05/2022 11:52:20  Multi-Disciplinary Care Plan Details Patient Name: Date of Service: [name] [name], [name] [name] 10/05/2022 2:45 PM Medical Record Number: [id number] Patient Account Number: [id number] Date of Birth/Sex: Treating RN: [birth year] (67 y.o. [name] [name] Primary Care Masey Scheiber: [name] [name] Other Clinician: Referring Harce Volden: Treating [name] [name]/Extender: [name] [name] in Treatment: 20 Multidisciplinary Care Plan reviewed with physician Active Inactive Pain, Acute or Chronic Nursing Diagnoses: Pain, acute or chronic: actual or potential Potential alteration in comfort, pain Goals: Patient will verbalize adequate pain control and receive pain control interventions during procedures as needed Date Initiated: 08/04/2021 Target Resolution Date: 01/10/2023 Goal Status: Active Patient/caregiver will verbalize comfort level met Date Initiated: 08/04/2021 Date Inactivated: 07/27/2022 Target Resolution Date: 09/10/2022 Goal Status: Met Interventions: Encourage patient to take pain medications as prescribed Provide education on pain management Reposition patient for comfort Treatment Activities: Administer pain control  measures as ordered : 08/04/2021 Notes: Electronic Signature(s) Signed: 10/05/2022 7:24:00 PM By: [name] Stall RN, BSN Entered By: [name] [name] on 10/05/2022 12:12:51 -------------------------------------------------------------------------------- Pain Assessment Details Patient Name: Date of Service: [name] [name], [name] [name] Neptune D. 10/05/2022 2:45 PM Medical Record Number: [id number] Patient Account Number: [id number] Date of Birth/Sex: Treating RN: 17-Jan-1955 (67 y.o. M) Primary Care Ginia Rudell: [name] [name] Other Clinician: Referring Yana Schorr: Treating Beyonca Wisz/Extender: [name] [name] in Treatment: 61 Active Problems Location of Pain Severity and Description of Pain Patient Has Paino No [name], [name] ([id number]) [phone].pdf Page 4 of 6 Patient Has Paino No Site Locations Pain Management and Medication Current Pain Management: Electronic Signature(s) Signed: 10/05/2022 4:28:55 PM By: [name] [name] Entered By: [name] [name] on 10/05/2022 11:52:27 -------------------------------------------------------------------------------- Patient/Caregiver Education Details Patient Name: Date of Service: [name] [name] 9/25/2024andnbsp2:45 PM Medical Record Number: [id number] Patient Account Number: [id number] Date of Birth/Gender: Treating RN: September 05, 1955 (67 y.o. [name] [name] Primary Care Physician: [name] [name] Other Clinician: Referring Physician: Treating Physician/Extender: [name] [name] in Treatment: 8 Education Assessment Education Provided To: Patient Education Topics Provided Wound/Skin Impairment: Handouts: Caring for Your Ulcer Methods: Explain/Verbal Responses: Reinforcements needed Electronic Signature(s) Signed: 10/05/2022 7:24:00 PM By: [name] Stall RN, BSN Entered By: [name] [name] on 10/05/2022 12:13:04 -------------------------------------------------------------------------------- Wound Assessment  Details Patient Name: Date of Service: [name] [name], [name] [name]. 10/05/2022 2:45 PM Medical Record Number: [id number] Patient Account Number: [id number] [name] [name], [name] [name] ([id number]) [phone].pdf Page 5 of 6 Date of Birth/Sex: Treating RN: 1955-05-16 (67 y.o. [name] [name] Primary Care Daeton Kluth: Other Clinician: Martha [name] Referring Janei Scheff: Treating Elias Dennington/Extender: [name] [name] in Treatment: 61 Wound Status Wound Number: 7 Primary Venous Leg Ulcer Etiology: Wound Location: Right, Medial Malleolus Secondary Lymphedema Wounding Event: Blister Etiology: Date Acquired: 07/21/2021 Wound Open Weeks Of Treatment: 61 Status: Clustered Wound: No Comorbid Sleep Apnea, Arrhythmia, Congestive Heart Failure, History: Hypertension, Hypotension, Peripheral Arterial Disease, Peripheral Venous Disease, Type II Diabetes, Gout, Osteoarthritis Photos Wound Measurements Length: (cm) Width: (cm) Depth: (cm) Area: (cm) Volume: (cm) 3.8 % Reduction in Area: 86.7% 1.7 % Reduction in Volume: 94.3% 0.3 Epithelialization: Medium (34-66%) 5.074 Tunneling: No 1.522 Undermining: No Wound Description Classification: Full Thickness With Exposed Supp Wound Margin: Distinct, outline attached Exudate Amount: Medium Exudate Type: Serosanguineous Exudate Color: red, brown ort Structures Foul Odor After Cleansing: No Slough/Fibrino Yes Wound Bed Granulation Amount: Large (67-100%) Exposed Structure Granulation Quality: Red Fascia Exposed: No Necrotic Amount: Small (1-33%) Fat Layer (Subcutaneous Tissue) Exposed: Yes Necrotic Quality: Adherent Slough Tendon Exposed: No Muscle Exposed: No Joint Exposed: No Bone Exposed: No Periwound  Skin Texture Texture Color No Abnormalities Noted: No No Abnormalities Noted: No Callus: Yes Atrophie Blanche: No Crepitus: No Cyanosis: No Excoriation: No Ecchymosis: No Induration: No Erythema: No Rash:  No Hemosiderin Staining: No Scarring: No Mottled: No Pallor: No Moisture Rubor: No No Abnormalities Noted: No Dry / Scaly: Yes Temperature / Pain Maceration: No Temperature: No Abnormality Treatment Notes Wound #7 (Malleolus) Wound Laterality: Right, Medial Cleanser Soap and Water Discharge Instruction: May shower and wash wound with dial antibacterial soap and water prior to dressing change. [name] [name] ([id number]) [id number] Page 6 of 6 Vashe 5.8 (oz) Discharge Instruction: Cleanse the wound with Vashe prior to applying a clean dressing using gauze sponges, not tissue or cotton balls. Peri-Wound Care Sween Lotion (Moisturizing lotion) Discharge Instruction: Apply moisturizing lotion as directed Topical Gentamicin Discharge Instruction: As directed by physician Primary Dressing Endoform 2x2 in Discharge Instruction: Moisten with saline Secondary Dressing CarboFLEX Odor Control Dressing, 4x4 in Discharge Instruction: Apply over primary dressing as directed. Drawtex 4x4 in Discharge Instruction: Apply over primary dressing as directed. Zetuvit Plus 4x8 in Discharge Instruction: Apply over primary dressing as directed. Secured With Compression Wrap Urgo K2, (equivalent to a 4 layer) two layer compression system, regular Discharge Instruction: Apply Urgo K2 as directed (alternative to 4 layer compression). Compression Stockings Add-Ons Electronic Signature(s) Signed: 10/05/2022 4:31:50 PM By: [name] Schwalbe RN Signed: 10/05/2022 7:24:00 PM By: [name] Stall RN, BSN Entered By: [name] [name] on 10/05/2022 12:10:03 -------------------------------------------------------------------------------- Vitals Details Patient Name: Date of Service: [name] [name], [name] [name]. 10/05/2022 2:45 PM Medical Record Number: [id number] Patient Account Number: [id number] Date of Birth/Sex: Treating RN: Jan 31, 1955 (67 y.o. M) Primary Care Summar [name] [name]: [name] [name] Other  Clinician: Referring [name] [name]: Treating [name] [name]/Extender: [name] [name] in Treatment: 61 Vital Signs Time Taken: 14:52 Temperature (F): 98.3 Height (in): 71 Pulse (bpm): 92 Weight (lbs): 350 Respiratory Rate (breaths/min): 18 Body Mass Index (BMI): 48.8 Blood Pressure (mmHg): 139/90 Reference Range: 80 - 120 mg / dl Electronic Signature(s) Signed: 10/05/2022 4:28:55 PM By: [name] [name] Entered By: [name] [name] on 10/05/2022 11:52:20

## 2022-10-07 ENCOUNTER — Other Ambulatory Visit (HOSPITAL_COMMUNITY): Payer: Self-pay | Admitting: Internal Medicine

## 2022-10-08 NOTE — Progress Notes (Signed)
Seen by casting department

## 2022-10-12 ENCOUNTER — Encounter (HOSPITAL_BASED_OUTPATIENT_CLINIC_OR_DEPARTMENT_OTHER): Payer: Medicare Other | Attending: Physician Assistant | Admitting: Physician Assistant

## 2022-10-12 DIAGNOSIS — I87333 Chronic venous hypertension (idiopathic) with ulcer and inflammation of bilateral lower extremity: Secondary | ICD-10-CM | POA: Diagnosis not present

## 2022-10-12 DIAGNOSIS — I4891 Unspecified atrial fibrillation: Secondary | ICD-10-CM | POA: Insufficient documentation

## 2022-10-12 DIAGNOSIS — Z8572 Personal history of non-Hodgkin lymphomas: Secondary | ICD-10-CM | POA: Diagnosis not present

## 2022-10-12 DIAGNOSIS — L97828 Non-pressure chronic ulcer of other part of left lower leg with other specified severity: Secondary | ICD-10-CM | POA: Insufficient documentation

## 2022-10-12 DIAGNOSIS — E11621 Type 2 diabetes mellitus with foot ulcer: Secondary | ICD-10-CM | POA: Diagnosis not present

## 2022-10-12 DIAGNOSIS — M109 Gout, unspecified: Secondary | ICD-10-CM | POA: Diagnosis not present

## 2022-10-12 DIAGNOSIS — Z950 Presence of cardiac pacemaker: Secondary | ICD-10-CM | POA: Diagnosis not present

## 2022-10-12 DIAGNOSIS — L97518 Non-pressure chronic ulcer of other part of right foot with other specified severity: Secondary | ICD-10-CM | POA: Diagnosis not present

## 2022-10-12 DIAGNOSIS — I872 Venous insufficiency (chronic) (peripheral): Secondary | ICD-10-CM | POA: Diagnosis not present

## 2022-10-12 DIAGNOSIS — E1151 Type 2 diabetes mellitus with diabetic peripheral angiopathy without gangrene: Secondary | ICD-10-CM | POA: Diagnosis not present

## 2022-10-12 DIAGNOSIS — M1990 Unspecified osteoarthritis, unspecified site: Secondary | ICD-10-CM | POA: Insufficient documentation

## 2022-10-12 DIAGNOSIS — Z89432 Acquired absence of left foot: Secondary | ICD-10-CM | POA: Diagnosis not present

## 2022-10-12 DIAGNOSIS — I509 Heart failure, unspecified: Secondary | ICD-10-CM | POA: Insufficient documentation

## 2022-10-12 DIAGNOSIS — I110 Hypertensive heart disease with heart failure: Secondary | ICD-10-CM | POA: Insufficient documentation

## 2022-10-12 DIAGNOSIS — Z7901 Long term (current) use of anticoagulants: Secondary | ICD-10-CM | POA: Diagnosis not present

## 2022-10-12 DIAGNOSIS — I87311 Chronic venous hypertension (idiopathic) with ulcer of right lower extremity: Secondary | ICD-10-CM | POA: Diagnosis not present

## 2022-10-12 DIAGNOSIS — Z6841 Body Mass Index (BMI) 40.0 and over, adult: Secondary | ICD-10-CM | POA: Insufficient documentation

## 2022-10-12 DIAGNOSIS — L97818 Non-pressure chronic ulcer of other part of right lower leg with other specified severity: Secondary | ICD-10-CM | POA: Diagnosis not present

## 2022-10-12 DIAGNOSIS — I2510 Atherosclerotic heart disease of native coronary artery without angina pectoris: Secondary | ICD-10-CM | POA: Diagnosis not present

## 2022-10-12 DIAGNOSIS — I890 Lymphedema, not elsewhere classified: Secondary | ICD-10-CM | POA: Insufficient documentation

## 2022-10-12 DIAGNOSIS — L97312 Non-pressure chronic ulcer of right ankle with fat layer exposed: Secondary | ICD-10-CM | POA: Diagnosis not present

## 2022-10-12 NOTE — Progress Notes (Signed)
JARRY, MANON ([id number]) [id number] Page 1 of 12 Visit Report for 10/12/2022 Chief Complaint Document Details Patient Name: Date of Service: LUCIFER, SOJA 10/12/2022 2:45 PM Medical Record Number: [id number] Patient Account Number: [id number] Date of Birth/Sex: Treating RN: May 15, 1955 (67 y.o. M) Primary Care Provider: [name] Other Clinician: Referring Provider: Treating Provider/Extender: Alver Fisher in Treatment: 08 Information Obtained from: Patient Chief Complaint 08/04/2021; patient returns to clinic with bilateral leg wounds as well as areas on the right foot Electronic Signature(s) Signed: 10/12/2022 3:36:41 PM By: Allen Derry PA-C Entered By: Allen Derry on 10/12/2022 12:36:41 -------------------------------------------------------------------------------- HPI Details Patient Name: Date of Service: Hyser, [name] 10/12/2022 2:45 PM Medical Record Number: [id number] Patient Account Number: [id number] Date of Birth/Sex: Treating RN: Aug 03, 1955 (67 y.o. M) Primary Care Provider: [name] Other Clinician: Referring Provider: Treating Provider/Extender: Alver Fisher in Treatment: 21 History of Present Illness HPI Description: ADMISSION 03/22/2021 This is a 67 year old man with a past medical history significant for diabetes type 2, congestive heart failure, peripheral arterial disease, morbid obesity, venous insufficiency, and coronary artery disease. He has been followed by Dr. Loreta Ave in podiatry, who performed a transmetatarsal amputation on the left foot in August 2022. He had issues healing that wound, but based upon Dr. Kenna Gilbert notes, ultimately the TMA wound healed. During his recovery from that surgery, however, ulcers opened up over the DIP joint of the right second and third toe. These have apparently closed and reopened multiple times. It sounds like one of the issues has been  moisture accumulation and maceration of the tissues causing them to reopen. At his last visit with Dr. Loreta Ave, on March 01, 2021, there continues to be problems with moisture and he was referred to wound care for further evaluation and management. He had a formal aortogram with runoff performed prior to his TMA. The findings are copied here: Patient has inline flow to both feet with no significant flow-limiting lesion that would be amenable to percutaneous or open revascularization. He does have an element of small vessel disease and has a short segment occlusion of the distal anterior tibial/dorsalis pedis artery on the left foot but does have posterior tibial artery flow. Would recommend management of wounds with amputation of toes 2 and 3 on the right foot if the wounds do not heal and deteriorate. Transmetatarsal amputation on the left side has as good a blood supply as it is going to get and hopefully this will heal in the future. Formal ABIs were done in January 2023. They are normal bilaterally. ABI Findings: +---------+------------------+-----+----------+--------+ Right Rt Pressure (mmHg)IndexWaveform Comment  +---------+------------------+-----+----------+--------+ Brachial 160     +---------+------------------+-----+----------+--------+ PTA 192 1.06 monophasic  +---------+------------------+-----+----------+--------+ DP 159 0.88 monophasic  +---------+------------------+-----+----------+--------+ Great T oe145 0.80    +---------+------------------+-----+----------+--------+ +--------+------------------+-----+---------+-------+ Rozetta Nunnery D ([id number]) [id number] Page 2 of 12 Left Lt Pressure (mmHg)IndexWaveform Comment +--------+------------------+-----+---------+-------+ [id number]     +--------+------------------+-----+---------+-------+ PTA 204 1.13 triphasic   +--------+------------------+-----+---------+-------+ DP 194 1.07 biphasic   +--------+------------------+-----+---------+-------+ +-------+-----------+-----------+------------+------------+ ABI/TBIT oday's ABIT oday's TBIPrevious ABIPrevious TBI +-------+-----------+-----------+------------+------------+ Right 1.06 0.8 1.26 0.65  +-------+-----------+-----------+------------+------------+ Left 1.13 amputation 1.15 amputation  +-------+-----------+-----------+------------+------------+ Previous ABI on 08/06/20 at [facility] Pedal pressures falsely elevated due to medial calcification. Summary: Right: Resting right ankle-brachial index is within normal range. The right toe-brachial index is normal. Left: Resting left ankle-brachial index is within normal range. READMISSION 08/04/2021 Mr. Pettingill is now a 67 year old man who I remember from this clinic many years ago I  JARRY, MANON ([id number]) [id number] Page 1 of 12 Visit Report for 10/12/2022 Chief Complaint Document Details Patient Name: Date of Service: LUCIFER, SOJA 10/12/2022 2:45 PM Medical Record Number: [id number] Patient Account Number: [id number] Date of Birth/Sex: Treating RN: May 15, 1955 (67 y.o. M) Primary Care Provider: [name] Other Clinician: Referring Provider: Treating Provider/Extender: Alver Fisher in Treatment: 08 Information Obtained from: Patient Chief Complaint 08/04/2021; patient returns to clinic with bilateral leg wounds as well as areas on the right foot Electronic Signature(s) Signed: 10/12/2022 3:36:41 PM By: Allen Derry PA-C Entered By: Allen Derry on 10/12/2022 12:36:41 -------------------------------------------------------------------------------- HPI Details Patient Name: Date of Service: Hyser, [name] 10/12/2022 2:45 PM Medical Record Number: [id number] Patient Account Number: [id number] Date of Birth/Sex: Treating RN: Aug 03, 1955 (67 y.o. M) Primary Care Provider: [name] Other Clinician: Referring Provider: Treating Provider/Extender: Alver Fisher in Treatment: 21 History of Present Illness HPI Description: ADMISSION 03/22/2021 This is a 67 year old man with a past medical history significant for diabetes type 2, congestive heart failure, peripheral arterial disease, morbid obesity, venous insufficiency, and coronary artery disease. He has been followed by Dr. Loreta Ave in podiatry, who performed a transmetatarsal amputation on the left foot in August 2022. He had issues healing that wound, but based upon Dr. Kenna Gilbert notes, ultimately the TMA wound healed. During his recovery from that surgery, however, ulcers opened up over the DIP joint of the right second and third toe. These have apparently closed and reopened multiple times. It sounds like one of the issues has been  moisture accumulation and maceration of the tissues causing them to reopen. At his last visit with Dr. Loreta Ave, on March 01, 2021, there continues to be problems with moisture and he was referred to wound care for further evaluation and management. He had a formal aortogram with runoff performed prior to his TMA. The findings are copied here: Patient has inline flow to both feet with no significant flow-limiting lesion that would be amenable to percutaneous or open revascularization. He does have an element of small vessel disease and has a short segment occlusion of the distal anterior tibial/dorsalis pedis artery on the left foot but does have posterior tibial artery flow. Would recommend management of wounds with amputation of toes 2 and 3 on the right foot if the wounds do not heal and deteriorate. Transmetatarsal amputation on the left side has as good a blood supply as it is going to get and hopefully this will heal in the future. Formal ABIs were done in January 2023. They are normal bilaterally. ABI Findings: +---------+------------------+-----+----------+--------+ Right Rt Pressure (mmHg)IndexWaveform Comment  +---------+------------------+-----+----------+--------+ Brachial 160     +---------+------------------+-----+----------+--------+ PTA 192 1.06 monophasic  +---------+------------------+-----+----------+--------+ DP 159 0.88 monophasic  +---------+------------------+-----+----------+--------+ Great T oe145 0.80    +---------+------------------+-----+----------+--------+ +--------+------------------+-----+---------+-------+ Rozetta Nunnery D ([id number]) [id number] Page 2 of 12 Left Lt Pressure (mmHg)IndexWaveform Comment +--------+------------------+-----+---------+-------+ [id number]     +--------+------------------+-----+---------+-------+ PTA 204 1.13 triphasic   +--------+------------------+-----+---------+-------+ DP 194 1.07 biphasic   +--------+------------------+-----+---------+-------+ +-------+-----------+-----------+------------+------------+ ABI/TBIT oday's ABIT oday's TBIPrevious ABIPrevious TBI +-------+-----------+-----------+------------+------------+ Right 1.06 0.8 1.26 0.65  +-------+-----------+-----------+------------+------------+ Left 1.13 amputation 1.15 amputation  +-------+-----------+-----------+------------+------------+ Previous ABI on 08/06/20 at [facility] Pedal pressures falsely elevated due to medial calcification. Summary: Right: Resting right ankle-brachial index is within normal range. The right toe-brachial index is normal. Left: Resting left ankle-brachial index is within normal range. READMISSION 08/04/2021 Mr. Pettingill is now a 67 year old man who I remember from this clinic many years ago I  appointment in 3 weeks. Leonard Schwartz Wednesday 10/26/2022 (already scheduled) Anesthetic (In clinic) Topical Lidocaine 5% applied to wound bed - Used in Clinic Prior to debridement (In clinic) Topical Lidocaine 4% applied to wound bed Cellular or Tissue Based Products Cellular or Tissue Based Product Type: - Run IVR for Kerrecis- denied #1 Apligraf applied 02/23/2022 #2 Apligraf applied 03/02/2022 #3 Apligraf applied 03/16/2022 #4 Apligraf applied 03/30/2022 HOLD APLIGRAF this week. Cellular or Tissue Based Product applied to wound bed, secured with steri-strips, cover with Adaptic or Mepitel. (DO NOT REMOVE). Bathing/ Shower/ Hygiene May shower with protection but do not get wound dressing(s) wet.  Protect dressing(s) with water repellant cover (for example, large plastic bag) or a cast cover and may then take shower. Edema Control - Lymphedema / SCD / Other Elevate legs to the level of the heart or above for 30 minutes daily and/or when sitting for 3-4 times a day throughout the day. Avoid standing for long periods of time. Patient to wear own compression stockings every day. - apply in the morning and remove at night left leg. Exercise regularly Moisturize legs daily. - apply every night before bed. Compression stocking or Garment 30-40 mm/Hg pressure to: - wear your VIVE compression stocking to left leg. apply in the morning and remove at night. Wound Treatment Wound #7 - Malleolus Wound Laterality: Right, Medial Cleanser: Soap and Water 1 x Per Week/30 Days Discharge Instructions: May shower and wash wound with dial antibacterial soap and water prior to dressing change. Cleanser: Vashe 5.8 (oz) 1 x Per Week/30 Days Discharge Instructions: Cleanse the wound with Vashe prior to applying a clean dressing using gauze sponges, not tissue or cotton balls. Peri-Wound Care: Sween Lotion (Moisturizing lotion) 1 x Per Week/30 Days Discharge Instructions: Apply moisturizing lotion as directed RONIT, MARCZAK ([id number]) [id number] Page 6 of 12 Topical: Gentamicin 1 x Per Week/30 Days Discharge Instructions: As directed by physician Prim Dressing: Endoform 2x2 in (Dispense As Written) 1 x Per Week/30 Days ary Discharge Instructions: Moisten with saline Secondary Dressing: CarboFLEX Odor Control Dressing, 4x4 in 1 x Per Week/30 Days Discharge Instructions: Apply over primary dressing as directed. Secondary Dressing: Drawtex 4x4 in 1 x Per Week/30 Days Discharge Instructions: Apply over primary dressing as directed. Secondary Dressing: Zetuvit Plus 4x8 in 1 x Per Week/30 Days Discharge Instructions: Apply over primary dressing as directed. Compression Wrap: Urgo  K2, (equivalent to a 4 layer) two layer compression system, regular 1 x Per Week/30 Days Discharge Instructions: Apply Urgo K2 as directed (alternative to 4 layer compression). Electronic Signature(s) Signed: 10/12/2022 5:35:47 PM By: Allen Derry PA-C Signed: 10/12/2022 5:53:00 PM By: Shawn Stall RN, BSN Entered By: Shawn Stall on 10/12/2022 12:58:37 -------------------------------------------------------------------------------- Problem List Details Patient Name: Date of Service: Guardia, [name] 10/12/2022 2:45 PM Medical Record Number: [id number] Patient Account Number: [id number] Date of Birth/Sex: Treating RN: 05/20/1955 (67 y.o. Harlon Flor, Millard.Loa Primary Care Provider: [name] Other Clinician: Referring Provider: Treating Provider/Extender: Alver Fisher in Treatment: 62 Active Problems ICD-10 Encounter Code Description Active Date MDM Diagnosis I89.0 Lymphedema, not elsewhere classified 08/04/2021 No Yes I87.333 Chronic venous hypertension (idiopathic) with ulcer and inflammation of 08/04/2021 No Yes bilateral lower extremity L97.828 Non-pressure chronic ulcer of other part of left lower leg with other specified 08/04/2021 No Yes severity L97.818 Non-pressure chronic ulcer of other part of right lower leg with other specified 08/04/2021 No Yes severity E11.621 Type 2 diabetes mellitus with foot ulcer 08/04/2021 No Yes  L97.518 Non-pressure chronic ulcer of other part of right foot with other specified 08/04/2021 No Yes severity HAIG, GERARDO D ([id number]) [phone].pdf Page 7 of 12 Inactive Problems Resolved Problems Electronic Signature(s) Signed: 10/12/2022 3:36:31 PM By: Allen Derry PA-C Entered By: Allen Derry on 10/12/2022 12:36:31 -------------------------------------------------------------------------------- Progress Note Details Patient Name: Date of Service: Arrellano, [name] 10/12/2022 2:45 PM Medical Record Number:  [id number] Patient Account Number: [id number] Date of Birth/Sex: Treating RN: Jul 11, 1955 (67 y.o. M) Primary Care Provider: [name] Other Clinician: Referring Provider: Treating Provider/Extender: Alver Fisher in Treatment: 91 Subjective Chief Complaint Information obtained from Patient 08/04/2021; patient returns to clinic with bilateral leg wounds as well as areas on the right foot History of Present Illness (HPI) ADMISSION 03/22/2021 This is a 66 year old man with a past medical history significant for diabetes type 2, congestive heart failure, peripheral arterial disease, morbid obesity, venous insufficiency, and coronary artery disease. He has been followed by Dr. Loreta Ave in podiatry, who performed a transmetatarsal amputation on the left foot in August 2022. He had issues healing that wound, but based upon Dr. Kenna Gilbert notes, ultimately the TMA wound healed. During his recovery from that surgery, however, ulcers opened up over the DIP joint of the right second and third toe. These have apparently closed and reopened multiple times. It sounds like one of the issues has been moisture accumulation and maceration of the tissues causing them to reopen. At his last visit with Dr. Loreta Ave, on March 01, 2021, there continues to be problems with moisture and he was referred to wound care for further evaluation and management. He had a formal aortogram with runoff performed prior to his TMA. The findings are copied here: Patient has inline flow to both feet with no significant flow-limiting lesion that would be amenable to percutaneous or open revascularization. He does have an element of small vessel disease and has a short segment occlusion of the distal anterior tibial/dorsalis pedis artery on the left foot but does have posterior tibial artery flow. Would recommend management of wounds with amputation of toes 2 and 3 on the right foot if the wounds do not heal and  deteriorate. Transmetatarsal amputation on the left side has as good a blood supply as it is going to get and hopefully this will heal in the future. Formal ABIs were done in January 2023. They are normal bilaterally. ABI Findings: +---------+------------------+-----+----------+--------+ Right Rt Pressure (mmHg)IndexWaveform Comment  +---------+------------------+-----+----------+--------+ Brachial 160    +---------+------------------+-----+----------+--------+ PTA 192 1.06 monophasic  +---------+------------------+-----+----------+--------+ DP 159 0.88 monophasic  +---------+------------------+-----+----------+--------+ Great T oe145 0.80    +---------+------------------+-----+----------+--------+ +--------+------------------+-----+---------+-------+ Left Lt Pressure (mmHg)IndexWaveform Comment +--------+------------------+-----+---------+-------+ [id number]    +--------+------------------+-----+---------+-------+ PTA 204 1.13 triphasic  +--------+------------------+-----+---------+-------+ DP 194 1.07 biphasic   +--------+------------------+-----+---------+-------+ +-------+-----------+-----------+------------+------------+ ABI/TBIT oday's ABIT oday's TBIPrevious ABIPrevious TBI +-------+-----------+-----------+------------+------------+ Right 1.06 0.8 1.26 0.65  +-------+-----------+-----------+------------+------------+ Left 1.13 amputation 1.15 amputation  +-------+-----------+-----------+------------+------------+ LADARRYL, WRAGE D ([id number]) [id number] Page 8 of 12 Previous ABI on 08/06/20 at [facility] Pedal pressures falsely elevated due to medial calcification. Summary: Right: Resting right ankle-brachial index is within normal range. The right toe-brachial index is normal. Left: Resting left ankle-brachial index is within normal range. READMISSION 08/04/2021 Mr. Mollica is  now a 67 year old man who I remember from this clinic many years ago I think he had a right lower extremity predominantly venous wound at the time. He was here for 1 visit in March of this year had wounds on his right second and third toes we apparently dressed  L97.518 Non-pressure chronic ulcer of other part of right foot with other specified 08/04/2021 No Yes severity HAIG, GERARDO D ([id number]) [phone].pdf Page 7 of 12 Inactive Problems Resolved Problems Electronic Signature(s) Signed: 10/12/2022 3:36:31 PM By: Allen Derry PA-C Entered By: Allen Derry on 10/12/2022 12:36:31 -------------------------------------------------------------------------------- Progress Note Details Patient Name: Date of Service: Arrellano, [name] 10/12/2022 2:45 PM Medical Record Number:  [id number] Patient Account Number: [id number] Date of Birth/Sex: Treating RN: Jul 11, 1955 (67 y.o. M) Primary Care Provider: [name] Other Clinician: Referring Provider: Treating Provider/Extender: Alver Fisher in Treatment: 91 Subjective Chief Complaint Information obtained from Patient 08/04/2021; patient returns to clinic with bilateral leg wounds as well as areas on the right foot History of Present Illness (HPI) ADMISSION 03/22/2021 This is a 66 year old man with a past medical history significant for diabetes type 2, congestive heart failure, peripheral arterial disease, morbid obesity, venous insufficiency, and coronary artery disease. He has been followed by Dr. Loreta Ave in podiatry, who performed a transmetatarsal amputation on the left foot in August 2022. He had issues healing that wound, but based upon Dr. Kenna Gilbert notes, ultimately the TMA wound healed. During his recovery from that surgery, however, ulcers opened up over the DIP joint of the right second and third toe. These have apparently closed and reopened multiple times. It sounds like one of the issues has been moisture accumulation and maceration of the tissues causing them to reopen. At his last visit with Dr. Loreta Ave, on March 01, 2021, there continues to be problems with moisture and he was referred to wound care for further evaluation and management. He had a formal aortogram with runoff performed prior to his TMA. The findings are copied here: Patient has inline flow to both feet with no significant flow-limiting lesion that would be amenable to percutaneous or open revascularization. He does have an element of small vessel disease and has a short segment occlusion of the distal anterior tibial/dorsalis pedis artery on the left foot but does have posterior tibial artery flow. Would recommend management of wounds with amputation of toes 2 and 3 on the right foot if the wounds do not heal and  deteriorate. Transmetatarsal amputation on the left side has as good a blood supply as it is going to get and hopefully this will heal in the future. Formal ABIs were done in January 2023. They are normal bilaterally. ABI Findings: +---------+------------------+-----+----------+--------+ Right Rt Pressure (mmHg)IndexWaveform Comment  +---------+------------------+-----+----------+--------+ Brachial 160    +---------+------------------+-----+----------+--------+ PTA 192 1.06 monophasic  +---------+------------------+-----+----------+--------+ DP 159 0.88 monophasic  +---------+------------------+-----+----------+--------+ Great T oe145 0.80    +---------+------------------+-----+----------+--------+ +--------+------------------+-----+---------+-------+ Left Lt Pressure (mmHg)IndexWaveform Comment +--------+------------------+-----+---------+-------+ [id number]    +--------+------------------+-----+---------+-------+ PTA 204 1.13 triphasic  +--------+------------------+-----+---------+-------+ DP 194 1.07 biphasic   +--------+------------------+-----+---------+-------+ +-------+-----------+-----------+------------+------------+ ABI/TBIT oday's ABIT oday's TBIPrevious ABIPrevious TBI +-------+-----------+-----------+------------+------------+ Right 1.06 0.8 1.26 0.65  +-------+-----------+-----------+------------+------------+ Left 1.13 amputation 1.15 amputation  +-------+-----------+-----------+------------+------------+ LADARRYL, WRAGE D ([id number]) [id number] Page 8 of 12 Previous ABI on 08/06/20 at [facility] Pedal pressures falsely elevated due to medial calcification. Summary: Right: Resting right ankle-brachial index is within normal range. The right toe-brachial index is normal. Left: Resting left ankle-brachial index is within normal range. READMISSION 08/04/2021 Mr. Mollica is  now a 67 year old man who I remember from this clinic many years ago I think he had a right lower extremity predominantly venous wound at the time. He was here for 1 visit in March of this year had wounds on his right second and third toes we apparently dressed  L97.518 Non-pressure chronic ulcer of other part of right foot with other specified 08/04/2021 No Yes severity HAIG, GERARDO D ([id number]) [phone].pdf Page 7 of 12 Inactive Problems Resolved Problems Electronic Signature(s) Signed: 10/12/2022 3:36:31 PM By: Allen Derry PA-C Entered By: Allen Derry on 10/12/2022 12:36:31 -------------------------------------------------------------------------------- Progress Note Details Patient Name: Date of Service: Arrellano, [name] 10/12/2022 2:45 PM Medical Record Number:  [id number] Patient Account Number: [id number] Date of Birth/Sex: Treating RN: Jul 11, 1955 (67 y.o. M) Primary Care Provider: [name] Other Clinician: Referring Provider: Treating Provider/Extender: Alver Fisher in Treatment: 91 Subjective Chief Complaint Information obtained from Patient 08/04/2021; patient returns to clinic with bilateral leg wounds as well as areas on the right foot History of Present Illness (HPI) ADMISSION 03/22/2021 This is a 66 year old man with a past medical history significant for diabetes type 2, congestive heart failure, peripheral arterial disease, morbid obesity, venous insufficiency, and coronary artery disease. He has been followed by Dr. Loreta Ave in podiatry, who performed a transmetatarsal amputation on the left foot in August 2022. He had issues healing that wound, but based upon Dr. Kenna Gilbert notes, ultimately the TMA wound healed. During his recovery from that surgery, however, ulcers opened up over the DIP joint of the right second and third toe. These have apparently closed and reopened multiple times. It sounds like one of the issues has been moisture accumulation and maceration of the tissues causing them to reopen. At his last visit with Dr. Loreta Ave, on March 01, 2021, there continues to be problems with moisture and he was referred to wound care for further evaluation and management. He had a formal aortogram with runoff performed prior to his TMA. The findings are copied here: Patient has inline flow to both feet with no significant flow-limiting lesion that would be amenable to percutaneous or open revascularization. He does have an element of small vessel disease and has a short segment occlusion of the distal anterior tibial/dorsalis pedis artery on the left foot but does have posterior tibial artery flow. Would recommend management of wounds with amputation of toes 2 and 3 on the right foot if the wounds do not heal and  deteriorate. Transmetatarsal amputation on the left side has as good a blood supply as it is going to get and hopefully this will heal in the future. Formal ABIs were done in January 2023. They are normal bilaterally. ABI Findings: +---------+------------------+-----+----------+--------+ Right Rt Pressure (mmHg)IndexWaveform Comment  +---------+------------------+-----+----------+--------+ Brachial 160    +---------+------------------+-----+----------+--------+ PTA 192 1.06 monophasic  +---------+------------------+-----+----------+--------+ DP 159 0.88 monophasic  +---------+------------------+-----+----------+--------+ Great T oe145 0.80    +---------+------------------+-----+----------+--------+ +--------+------------------+-----+---------+-------+ Left Lt Pressure (mmHg)IndexWaveform Comment +--------+------------------+-----+---------+-------+ [id number]    +--------+------------------+-----+---------+-------+ PTA 204 1.13 triphasic  +--------+------------------+-----+---------+-------+ DP 194 1.07 biphasic   +--------+------------------+-----+---------+-------+ +-------+-----------+-----------+------------+------------+ ABI/TBIT oday's ABIT oday's TBIPrevious ABIPrevious TBI +-------+-----------+-----------+------------+------------+ Right 1.06 0.8 1.26 0.65  +-------+-----------+-----------+------------+------------+ Left 1.13 amputation 1.15 amputation  +-------+-----------+-----------+------------+------------+ LADARRYL, WRAGE D ([id number]) [id number] Page 8 of 12 Previous ABI on 08/06/20 at [facility] Pedal pressures falsely elevated due to medial calcification. Summary: Right: Resting right ankle-brachial index is within normal range. The right toe-brachial index is normal. Left: Resting left ankle-brachial index is within normal range. READMISSION 08/04/2021 Mr. Mollica is  now a 67 year old man who I remember from this clinic many years ago I think he had a right lower extremity predominantly venous wound at the time. He was here for 1 visit in March of this year had wounds on his right second and third toes we apparently dressed  L97.518 Non-pressure chronic ulcer of other part of right foot with other specified 08/04/2021 No Yes severity HAIG, GERARDO D ([id number]) [phone].pdf Page 7 of 12 Inactive Problems Resolved Problems Electronic Signature(s) Signed: 10/12/2022 3:36:31 PM By: Allen Derry PA-C Entered By: Allen Derry on 10/12/2022 12:36:31 -------------------------------------------------------------------------------- Progress Note Details Patient Name: Date of Service: Arrellano, [name] 10/12/2022 2:45 PM Medical Record Number:  [id number] Patient Account Number: [id number] Date of Birth/Sex: Treating RN: Jul 11, 1955 (67 y.o. M) Primary Care Provider: [name] Other Clinician: Referring Provider: Treating Provider/Extender: Alver Fisher in Treatment: 91 Subjective Chief Complaint Information obtained from Patient 08/04/2021; patient returns to clinic with bilateral leg wounds as well as areas on the right foot History of Present Illness (HPI) ADMISSION 03/22/2021 This is a 66 year old man with a past medical history significant for diabetes type 2, congestive heart failure, peripheral arterial disease, morbid obesity, venous insufficiency, and coronary artery disease. He has been followed by Dr. Loreta Ave in podiatry, who performed a transmetatarsal amputation on the left foot in August 2022. He had issues healing that wound, but based upon Dr. Kenna Gilbert notes, ultimately the TMA wound healed. During his recovery from that surgery, however, ulcers opened up over the DIP joint of the right second and third toe. These have apparently closed and reopened multiple times. It sounds like one of the issues has been moisture accumulation and maceration of the tissues causing them to reopen. At his last visit with Dr. Loreta Ave, on March 01, 2021, there continues to be problems with moisture and he was referred to wound care for further evaluation and management. He had a formal aortogram with runoff performed prior to his TMA. The findings are copied here: Patient has inline flow to both feet with no significant flow-limiting lesion that would be amenable to percutaneous or open revascularization. He does have an element of small vessel disease and has a short segment occlusion of the distal anterior tibial/dorsalis pedis artery on the left foot but does have posterior tibial artery flow. Would recommend management of wounds with amputation of toes 2 and 3 on the right foot if the wounds do not heal and  deteriorate. Transmetatarsal amputation on the left side has as good a blood supply as it is going to get and hopefully this will heal in the future. Formal ABIs were done in January 2023. They are normal bilaterally. ABI Findings: +---------+------------------+-----+----------+--------+ Right Rt Pressure (mmHg)IndexWaveform Comment  +---------+------------------+-----+----------+--------+ Brachial 160    +---------+------------------+-----+----------+--------+ PTA 192 1.06 monophasic  +---------+------------------+-----+----------+--------+ DP 159 0.88 monophasic  +---------+------------------+-----+----------+--------+ Great T oe145 0.80    +---------+------------------+-----+----------+--------+ +--------+------------------+-----+---------+-------+ Left Lt Pressure (mmHg)IndexWaveform Comment +--------+------------------+-----+---------+-------+ [id number]    +--------+------------------+-----+---------+-------+ PTA 204 1.13 triphasic  +--------+------------------+-----+---------+-------+ DP 194 1.07 biphasic   +--------+------------------+-----+---------+-------+ +-------+-----------+-----------+------------+------------+ ABI/TBIT oday's ABIT oday's TBIPrevious ABIPrevious TBI +-------+-----------+-----------+------------+------------+ Right 1.06 0.8 1.26 0.65  +-------+-----------+-----------+------------+------------+ Left 1.13 amputation 1.15 amputation  +-------+-----------+-----------+------------+------------+ LADARRYL, WRAGE D ([id number]) [id number] Page 8 of 12 Previous ABI on 08/06/20 at [facility] Pedal pressures falsely elevated due to medial calcification. Summary: Right: Resting right ankle-brachial index is within normal range. The right toe-brachial index is normal. Left: Resting left ankle-brachial index is within normal range. READMISSION 08/04/2021 Mr. Mollica is  now a 67 year old man who I remember from this clinic many years ago I think he had a right lower extremity predominantly venous wound at the time. He was here for 1 visit in March of this year had wounds on his right second and third toes we apparently dressed  with where we stand today. Fortunately I do not see any signs of active infection which is great news. 09-14-2022 upon evaluation today patient appears to be doing well current regard to his wound with section showing signs of improvement would be using endoform which I think is really doing a great job here. Fortunately I do not see any signs of worsening overall. 09-21-2022 upon evaluation today patient appears to be doing well currently in regard to his wound. He has been tolerating the dressing changes without complication. Fortunately there does not appear to be any signs of active infection at this time which is good news. 09-28-2022 upon evaluation today patient appears to be doing well currently in regard to his wound which is actually measuring quite a bit smaller. This looks to be doing well and very pleased with where things stand today I do not see any signs of active infection locally or systemically which is great news. No  fevers, chills, nausea, vomiting, or diarrhea. 10-05-2022 upon evaluation today patient appears to be doing excellent in regard to his wounds. He has been tolerating the dressing changes without complication and in general I do believe that we will make an really good headway towards complete closure. 10/12/2022 upon evaluation today patient's wound is actually showing signs of excellent improvement. I am actually very pleased with where we stand I think that he is moving in the right direction and this is excellent news. I do not see any evidence of worsening overall Electronic Signature(s) Signed: 10/12/2022 5:16:44 PM By: Allen Derry PA-C Entered By: Allen Derry on 10/12/2022 14:16:44 -------------------------------------------------------------------------------- Physical Exam Details Patient Name: Date of Service: Ghee, [name] 10/12/2022 2:45 PM Medical Record Number: [id number] Patient Account Number: [id number] Date of Birth/Sex: Treating RN: 03-11-1955 (67 y.o. M) Primary Care Provider: [name] Other Clinician: Referring Provider: Treating Provider/Extender: Alver Fisher in Treatment: 34 Constitutional Well-nourished and well-hydrated in no acute distress. Respiratory normal breathing without difficulty. Psychiatric this patient is able to make decisions and demonstrates good insight into disease process. Alert and Oriented x 3. pleasant and cooperative. Notes Upon inspection patient's wound bed actually showed signs of excellent granulation epithelization there is no signs of infection and in general I do believe that he is really doing quite well. JACIEL, DIEM ([id number]) [id number] Page 5 of 12 Electronic Signature(s) Signed: 10/12/2022 5:17:09 PM By: Allen Derry PA-C Entered By: Allen Derry on 10/12/2022 14:17:09 -------------------------------------------------------------------------------- Physician Orders  Details Patient Name: Date of Service: Krichbaum, [name] 10/12/2022 2:45 PM Medical Record Number: [id number] Patient Account Number: [id number] Date of Birth/Sex: Treating RN: [birth year] (67 y.o. Harlon Flor, Millard.Loa Primary Care Provider: [name] Other Clinician: Referring Provider: Treating Provider/Extender: Alver Fisher in Treatment: 73 The following information was scribed by: Shawn Stall The information was scribed for: Lenda Kelp Verbal / Phone Orders: No Diagnosis Coding ICD-10 Coding Code Description I89.0 Lymphedema, not elsewhere classified I87.333 Chronic venous hypertension (idiopathic) with ulcer and inflammation of bilateral lower extremity L97.828 Non-pressure chronic ulcer of other part of left lower leg with other specified severity L97.818 Non-pressure chronic ulcer of other part of right lower leg with other specified severity E11.621 Type 2 diabetes mellitus with foot ulcer L97.518 Non-pressure chronic ulcer of other part of right foot with other specified severity Follow-up Appointments ppointment in 1 week. Leonard Schwartz Wednesday 10/19/2022 (already scheduled) Return A ppointment in 2 weeks. Leonard Schwartz Wednesday (already scheduled) Return A Return  appointment in 3 weeks. Leonard Schwartz Wednesday 10/26/2022 (already scheduled) Anesthetic (In clinic) Topical Lidocaine 5% applied to wound bed - Used in Clinic Prior to debridement (In clinic) Topical Lidocaine 4% applied to wound bed Cellular or Tissue Based Products Cellular or Tissue Based Product Type: - Run IVR for Kerrecis- denied #1 Apligraf applied 02/23/2022 #2 Apligraf applied 03/02/2022 #3 Apligraf applied 03/16/2022 #4 Apligraf applied 03/30/2022 HOLD APLIGRAF this week. Cellular or Tissue Based Product applied to wound bed, secured with steri-strips, cover with Adaptic or Mepitel. (DO NOT REMOVE). Bathing/ Shower/ Hygiene May shower with protection but do not get wound dressing(s) wet.  Protect dressing(s) with water repellant cover (for example, large plastic bag) or a cast cover and may then take shower. Edema Control - Lymphedema / SCD / Other Elevate legs to the level of the heart or above for 30 minutes daily and/or when sitting for 3-4 times a day throughout the day. Avoid standing for long periods of time. Patient to wear own compression stockings every day. - apply in the morning and remove at night left leg. Exercise regularly Moisturize legs daily. - apply every night before bed. Compression stocking or Garment 30-40 mm/Hg pressure to: - wear your VIVE compression stocking to left leg. apply in the morning and remove at night. Wound Treatment Wound #7 - Malleolus Wound Laterality: Right, Medial Cleanser: Soap and Water 1 x Per Week/30 Days Discharge Instructions: May shower and wash wound with dial antibacterial soap and water prior to dressing change. Cleanser: Vashe 5.8 (oz) 1 x Per Week/30 Days Discharge Instructions: Cleanse the wound with Vashe prior to applying a clean dressing using gauze sponges, not tissue or cotton balls. Peri-Wound Care: Sween Lotion (Moisturizing lotion) 1 x Per Week/30 Days Discharge Instructions: Apply moisturizing lotion as directed RONIT, MARCZAK ([id number]) [id number] Page 6 of 12 Topical: Gentamicin 1 x Per Week/30 Days Discharge Instructions: As directed by physician Prim Dressing: Endoform 2x2 in (Dispense As Written) 1 x Per Week/30 Days ary Discharge Instructions: Moisten with saline Secondary Dressing: CarboFLEX Odor Control Dressing, 4x4 in 1 x Per Week/30 Days Discharge Instructions: Apply over primary dressing as directed. Secondary Dressing: Drawtex 4x4 in 1 x Per Week/30 Days Discharge Instructions: Apply over primary dressing as directed. Secondary Dressing: Zetuvit Plus 4x8 in 1 x Per Week/30 Days Discharge Instructions: Apply over primary dressing as directed. Compression Wrap: Urgo  K2, (equivalent to a 4 layer) two layer compression system, regular 1 x Per Week/30 Days Discharge Instructions: Apply Urgo K2 as directed (alternative to 4 layer compression). Electronic Signature(s) Signed: 10/12/2022 5:35:47 PM By: Allen Derry PA-C Signed: 10/12/2022 5:53:00 PM By: Shawn Stall RN, BSN Entered By: Shawn Stall on 10/12/2022 12:58:37 -------------------------------------------------------------------------------- Problem List Details Patient Name: Date of Service: Guardia, [name] 10/12/2022 2:45 PM Medical Record Number: [id number] Patient Account Number: [id number] Date of Birth/Sex: Treating RN: 05/20/1955 (67 y.o. Harlon Flor, Millard.Loa Primary Care Provider: [name] Other Clinician: Referring Provider: Treating Provider/Extender: Alver Fisher in Treatment: 62 Active Problems ICD-10 Encounter Code Description Active Date MDM Diagnosis I89.0 Lymphedema, not elsewhere classified 08/04/2021 No Yes I87.333 Chronic venous hypertension (idiopathic) with ulcer and inflammation of 08/04/2021 No Yes bilateral lower extremity L97.828 Non-pressure chronic ulcer of other part of left lower leg with other specified 08/04/2021 No Yes severity L97.818 Non-pressure chronic ulcer of other part of right lower leg with other specified 08/04/2021 No Yes severity E11.621 Type 2 diabetes mellitus with foot ulcer 08/04/2021 No Yes  Appointment in 2 weeks. Leonard Schwartz Wednesday (already scheduled) Return appointment in 3 weeks. Leonard Schwartz Wednesday 10/26/2022 (already scheduled) Anesthetic: (In clinic) Topical Lidocaine 5% applied to wound bed - Used in Clinic Prior to debridement (In clinic) Topical Lidocaine 4% applied to wound bed Cellular or Tissue Based Products: Cellular or Tissue Based Product Type: - Run IVR for Kerrecis- denied #1 Apligraf applied 02/23/2022 #2 Apligraf applied 03/02/2022 #3 Apligraf applied 03/16/2022 #4 Apligraf applied 03/30/2022 HOLD APLIGRAF this week. Cellular or Tissue Based Product applied to wound bed, secured with steri-strips, cover with Adaptic or Mepitel. (DO NOT REMOVE). Bathing/ Shower/ Hygiene: May shower with protection but do not get wound dressing(s) wet. Protect dressing(s) with water repellant cover (for example, large plastic bag) or a cast cover and may then take shower. Edema Control - Lymphedema / SCD / Other: Elevate legs to the level of the heart or above for 30 minutes daily and/or when sitting for 3-4 times a day throughout the day. Avoid standing for long periods of time. Patient to wear own compression stockings every day. - apply in the morning and remove at night left leg. Exercise regularly Moisturize legs daily. - apply every night before bed. Compression stocking or Garment 30-40 mm/Hg pressure to: - wear your VIVE compression stocking to left leg. apply in the morning and remove at night. WOUND #7: - Malleolus Wound  Laterality: Right, Medial Cleanser: Soap and Water 1 x Per Week/30 Days Discharge Instructions: May shower and wash wound with dial antibacterial soap and water prior to dressing change. Cleanser: Vashe 5.8 (oz) 1 x Per Week/30 Days Discharge Instructions: Cleanse the wound with Vashe prior to applying a clean dressing using gauze sponges, not tissue or cotton balls. Peri-Wound Care: Sween Lotion (Moisturizing lotion) 1 x Per Week/30 Days Discharge Instructions: Apply moisturizing lotion as directed Topical: Gentamicin 1 x Per Week/30 Days Discharge Instructions: As directed by physician Prim Dressing: Endoform 2x2 in (Dispense As Written) 1 x Per Week/30 Days ary Discharge Instructions: Moisten with saline Secondary Dressing: CarboFLEX Odor Control Dressing, 4x4 in 1 x Per Week/30 Days Discharge Instructions: Apply over primary dressing as directed. Secondary Dressing: Drawtex 4x4 in 1 x Per Week/30 Days Discharge Instructions: Apply over primary dressing as directed. Secondary Dressing: Zetuvit Plus 4x8 in 1 x Per Week/30 Days Discharge Instructions: Apply over primary dressing as directed. Com pression Wrap: Urgo K2, (equivalent to a 4 layer) two layer compression system, regular 1 x Per Week/30 Days Discharge Instructions: Apply Urgo K2 as directed (alternative to 4 layer compression). 1. I would recommend currently that we have the patient continue to monitor for any evidence of infection or worsening. Overall based on what I am seeing I do believe that we are making really good headway towards closure I think endoform is doing great. 2. I am then recommend as well that the patient should continue with the compression wrap. We are using the Urgo K2 compression wrap which is doing an awesome job. We will see patient back for reevaluation in 1 week here in the clinic. If anything worsens or changes patient will contact our office for additional recommendations. Electronic  Signature(s) Signed: 10/12/2022 5:17:28 PM By: Allen Derry PA-C Entered By: Allen Derry on 10/12/2022 14:17:27 -------------------------------------------------------------------------------- SuperBill Details Patient Name: Date of Service: Sthilaire, [name] 10/12/2022 Medical Record Number: [id number] Patient Account Number: [id number] Date of Birth/Sex: Treating RN: [birth year] (67 y.o. [name] Primary Care Provider: [name] Other Clinician: Referring Provider: Treating Provider/Extender:  appointment in 3 weeks. Leonard Schwartz Wednesday 10/26/2022 (already scheduled) Anesthetic (In clinic) Topical Lidocaine 5% applied to wound bed - Used in Clinic Prior to debridement (In clinic) Topical Lidocaine 4% applied to wound bed Cellular or Tissue Based Products Cellular or Tissue Based Product Type: - Run IVR for Kerrecis- denied #1 Apligraf applied 02/23/2022 #2 Apligraf applied 03/02/2022 #3 Apligraf applied 03/16/2022 #4 Apligraf applied 03/30/2022 HOLD APLIGRAF this week. Cellular or Tissue Based Product applied to wound bed, secured with steri-strips, cover with Adaptic or Mepitel. (DO NOT REMOVE). Bathing/ Shower/ Hygiene May shower with protection but do not get wound dressing(s) wet.  Protect dressing(s) with water repellant cover (for example, large plastic bag) or a cast cover and may then take shower. Edema Control - Lymphedema / SCD / Other Elevate legs to the level of the heart or above for 30 minutes daily and/or when sitting for 3-4 times a day throughout the day. Avoid standing for long periods of time. Patient to wear own compression stockings every day. - apply in the morning and remove at night left leg. Exercise regularly Moisturize legs daily. - apply every night before bed. Compression stocking or Garment 30-40 mm/Hg pressure to: - wear your VIVE compression stocking to left leg. apply in the morning and remove at night. Wound Treatment Wound #7 - Malleolus Wound Laterality: Right, Medial Cleanser: Soap and Water 1 x Per Week/30 Days Discharge Instructions: May shower and wash wound with dial antibacterial soap and water prior to dressing change. Cleanser: Vashe 5.8 (oz) 1 x Per Week/30 Days Discharge Instructions: Cleanse the wound with Vashe prior to applying a clean dressing using gauze sponges, not tissue or cotton balls. Peri-Wound Care: Sween Lotion (Moisturizing lotion) 1 x Per Week/30 Days Discharge Instructions: Apply moisturizing lotion as directed RONIT, MARCZAK ([id number]) [id number] Page 6 of 12 Topical: Gentamicin 1 x Per Week/30 Days Discharge Instructions: As directed by physician Prim Dressing: Endoform 2x2 in (Dispense As Written) 1 x Per Week/30 Days ary Discharge Instructions: Moisten with saline Secondary Dressing: CarboFLEX Odor Control Dressing, 4x4 in 1 x Per Week/30 Days Discharge Instructions: Apply over primary dressing as directed. Secondary Dressing: Drawtex 4x4 in 1 x Per Week/30 Days Discharge Instructions: Apply over primary dressing as directed. Secondary Dressing: Zetuvit Plus 4x8 in 1 x Per Week/30 Days Discharge Instructions: Apply over primary dressing as directed. Compression Wrap: Urgo  K2, (equivalent to a 4 layer) two layer compression system, regular 1 x Per Week/30 Days Discharge Instructions: Apply Urgo K2 as directed (alternative to 4 layer compression). Electronic Signature(s) Signed: 10/12/2022 5:35:47 PM By: Allen Derry PA-C Signed: 10/12/2022 5:53:00 PM By: Shawn Stall RN, BSN Entered By: Shawn Stall on 10/12/2022 12:58:37 -------------------------------------------------------------------------------- Problem List Details Patient Name: Date of Service: Guardia, [name] 10/12/2022 2:45 PM Medical Record Number: [id number] Patient Account Number: [id number] Date of Birth/Sex: Treating RN: 05/20/1955 (67 y.o. Harlon Flor, Millard.Loa Primary Care Provider: [name] Other Clinician: Referring Provider: Treating Provider/Extender: Alver Fisher in Treatment: 62 Active Problems ICD-10 Encounter Code Description Active Date MDM Diagnosis I89.0 Lymphedema, not elsewhere classified 08/04/2021 No Yes I87.333 Chronic venous hypertension (idiopathic) with ulcer and inflammation of 08/04/2021 No Yes bilateral lower extremity L97.828 Non-pressure chronic ulcer of other part of left lower leg with other specified 08/04/2021 No Yes severity L97.818 Non-pressure chronic ulcer of other part of right lower leg with other specified 08/04/2021 No Yes severity E11.621 Type 2 diabetes mellitus with foot ulcer 08/04/2021 No Yes  with where we stand today. Fortunately I do not see any signs of active infection which is great news. 09-14-2022 upon evaluation today patient appears to be doing well current regard to his wound with section showing signs of improvement would be using endoform which I think is really doing a great job here. Fortunately I do not see any signs of worsening overall. 09-21-2022 upon evaluation today patient appears to be doing well currently in regard to his wound. He has been tolerating the dressing changes without complication. Fortunately there does not appear to be any signs of active infection at this time which is good news. 09-28-2022 upon evaluation today patient appears to be doing well currently in regard to his wound which is actually measuring quite a bit smaller. This looks to be doing well and very pleased with where things stand today I do not see any signs of active infection locally or systemically which is great news. No  fevers, chills, nausea, vomiting, or diarrhea. 10-05-2022 upon evaluation today patient appears to be doing excellent in regard to his wounds. He has been tolerating the dressing changes without complication and in general I do believe that we will make an really good headway towards complete closure. 10/12/2022 upon evaluation today patient's wound is actually showing signs of excellent improvement. I am actually very pleased with where we stand I think that he is moving in the right direction and this is excellent news. I do not see any evidence of worsening overall Electronic Signature(s) Signed: 10/12/2022 5:16:44 PM By: Allen Derry PA-C Entered By: Allen Derry on 10/12/2022 14:16:44 -------------------------------------------------------------------------------- Physical Exam Details Patient Name: Date of Service: Ghee, [name] 10/12/2022 2:45 PM Medical Record Number: [id number] Patient Account Number: [id number] Date of Birth/Sex: Treating RN: 03-11-1955 (67 y.o. M) Primary Care Provider: [name] Other Clinician: Referring Provider: Treating Provider/Extender: Alver Fisher in Treatment: 34 Constitutional Well-nourished and well-hydrated in no acute distress. Respiratory normal breathing without difficulty. Psychiatric this patient is able to make decisions and demonstrates good insight into disease process. Alert and Oriented x 3. pleasant and cooperative. Notes Upon inspection patient's wound bed actually showed signs of excellent granulation epithelization there is no signs of infection and in general I do believe that he is really doing quite well. JACIEL, DIEM ([id number]) [id number] Page 5 of 12 Electronic Signature(s) Signed: 10/12/2022 5:17:09 PM By: Allen Derry PA-C Entered By: Allen Derry on 10/12/2022 14:17:09 -------------------------------------------------------------------------------- Physician Orders  Details Patient Name: Date of Service: Krichbaum, [name] 10/12/2022 2:45 PM Medical Record Number: [id number] Patient Account Number: [id number] Date of Birth/Sex: Treating RN: [birth year] (67 y.o. Harlon Flor, Millard.Loa Primary Care Provider: [name] Other Clinician: Referring Provider: Treating Provider/Extender: Alver Fisher in Treatment: 73 The following information was scribed by: Shawn Stall The information was scribed for: Lenda Kelp Verbal / Phone Orders: No Diagnosis Coding ICD-10 Coding Code Description I89.0 Lymphedema, not elsewhere classified I87.333 Chronic venous hypertension (idiopathic) with ulcer and inflammation of bilateral lower extremity L97.828 Non-pressure chronic ulcer of other part of left lower leg with other specified severity L97.818 Non-pressure chronic ulcer of other part of right lower leg with other specified severity E11.621 Type 2 diabetes mellitus with foot ulcer L97.518 Non-pressure chronic ulcer of other part of right foot with other specified severity Follow-up Appointments ppointment in 1 week. Leonard Schwartz Wednesday 10/19/2022 (already scheduled) Return A ppointment in 2 weeks. Leonard Schwartz Wednesday (already scheduled) Return A Return  appointment in 3 weeks. Leonard Schwartz Wednesday 10/26/2022 (already scheduled) Anesthetic (In clinic) Topical Lidocaine 5% applied to wound bed - Used in Clinic Prior to debridement (In clinic) Topical Lidocaine 4% applied to wound bed Cellular or Tissue Based Products Cellular or Tissue Based Product Type: - Run IVR for Kerrecis- denied #1 Apligraf applied 02/23/2022 #2 Apligraf applied 03/02/2022 #3 Apligraf applied 03/16/2022 #4 Apligraf applied 03/30/2022 HOLD APLIGRAF this week. Cellular or Tissue Based Product applied to wound bed, secured with steri-strips, cover with Adaptic or Mepitel. (DO NOT REMOVE). Bathing/ Shower/ Hygiene May shower with protection but do not get wound dressing(s) wet.  Protect dressing(s) with water repellant cover (for example, large plastic bag) or a cast cover and may then take shower. Edema Control - Lymphedema / SCD / Other Elevate legs to the level of the heart or above for 30 minutes daily and/or when sitting for 3-4 times a day throughout the day. Avoid standing for long periods of time. Patient to wear own compression stockings every day. - apply in the morning and remove at night left leg. Exercise regularly Moisturize legs daily. - apply every night before bed. Compression stocking or Garment 30-40 mm/Hg pressure to: - wear your VIVE compression stocking to left leg. apply in the morning and remove at night. Wound Treatment Wound #7 - Malleolus Wound Laterality: Right, Medial Cleanser: Soap and Water 1 x Per Week/30 Days Discharge Instructions: May shower and wash wound with dial antibacterial soap and water prior to dressing change. Cleanser: Vashe 5.8 (oz) 1 x Per Week/30 Days Discharge Instructions: Cleanse the wound with Vashe prior to applying a clean dressing using gauze sponges, not tissue or cotton balls. Peri-Wound Care: Sween Lotion (Moisturizing lotion) 1 x Per Week/30 Days Discharge Instructions: Apply moisturizing lotion as directed RONIT, MARCZAK ([id number]) [id number] Page 6 of 12 Topical: Gentamicin 1 x Per Week/30 Days Discharge Instructions: As directed by physician Prim Dressing: Endoform 2x2 in (Dispense As Written) 1 x Per Week/30 Days ary Discharge Instructions: Moisten with saline Secondary Dressing: CarboFLEX Odor Control Dressing, 4x4 in 1 x Per Week/30 Days Discharge Instructions: Apply over primary dressing as directed. Secondary Dressing: Drawtex 4x4 in 1 x Per Week/30 Days Discharge Instructions: Apply over primary dressing as directed. Secondary Dressing: Zetuvit Plus 4x8 in 1 x Per Week/30 Days Discharge Instructions: Apply over primary dressing as directed. Compression Wrap: Urgo  K2, (equivalent to a 4 layer) two layer compression system, regular 1 x Per Week/30 Days Discharge Instructions: Apply Urgo K2 as directed (alternative to 4 layer compression). Electronic Signature(s) Signed: 10/12/2022 5:35:47 PM By: Allen Derry PA-C Signed: 10/12/2022 5:53:00 PM By: Shawn Stall RN, BSN Entered By: Shawn Stall on 10/12/2022 12:58:37 -------------------------------------------------------------------------------- Problem List Details Patient Name: Date of Service: Guardia, [name] 10/12/2022 2:45 PM Medical Record Number: [id number] Patient Account Number: [id number] Date of Birth/Sex: Treating RN: 05/20/1955 (67 y.o. Harlon Flor, Millard.Loa Primary Care Provider: [name] Other Clinician: Referring Provider: Treating Provider/Extender: Alver Fisher in Treatment: 62 Active Problems ICD-10 Encounter Code Description Active Date MDM Diagnosis I89.0 Lymphedema, not elsewhere classified 08/04/2021 No Yes I87.333 Chronic venous hypertension (idiopathic) with ulcer and inflammation of 08/04/2021 No Yes bilateral lower extremity L97.828 Non-pressure chronic ulcer of other part of left lower leg with other specified 08/04/2021 No Yes severity L97.818 Non-pressure chronic ulcer of other part of right lower leg with other specified 08/04/2021 No Yes severity E11.621 Type 2 diabetes mellitus with foot ulcer 08/04/2021 No Yes  appointment in 3 weeks. Leonard Schwartz Wednesday 10/26/2022 (already scheduled) Anesthetic (In clinic) Topical Lidocaine 5% applied to wound bed - Used in Clinic Prior to debridement (In clinic) Topical Lidocaine 4% applied to wound bed Cellular or Tissue Based Products Cellular or Tissue Based Product Type: - Run IVR for Kerrecis- denied #1 Apligraf applied 02/23/2022 #2 Apligraf applied 03/02/2022 #3 Apligraf applied 03/16/2022 #4 Apligraf applied 03/30/2022 HOLD APLIGRAF this week. Cellular or Tissue Based Product applied to wound bed, secured with steri-strips, cover with Adaptic or Mepitel. (DO NOT REMOVE). Bathing/ Shower/ Hygiene May shower with protection but do not get wound dressing(s) wet.  Protect dressing(s) with water repellant cover (for example, large plastic bag) or a cast cover and may then take shower. Edema Control - Lymphedema / SCD / Other Elevate legs to the level of the heart or above for 30 minutes daily and/or when sitting for 3-4 times a day throughout the day. Avoid standing for long periods of time. Patient to wear own compression stockings every day. - apply in the morning and remove at night left leg. Exercise regularly Moisturize legs daily. - apply every night before bed. Compression stocking or Garment 30-40 mm/Hg pressure to: - wear your VIVE compression stocking to left leg. apply in the morning and remove at night. Wound Treatment Wound #7 - Malleolus Wound Laterality: Right, Medial Cleanser: Soap and Water 1 x Per Week/30 Days Discharge Instructions: May shower and wash wound with dial antibacterial soap and water prior to dressing change. Cleanser: Vashe 5.8 (oz) 1 x Per Week/30 Days Discharge Instructions: Cleanse the wound with Vashe prior to applying a clean dressing using gauze sponges, not tissue or cotton balls. Peri-Wound Care: Sween Lotion (Moisturizing lotion) 1 x Per Week/30 Days Discharge Instructions: Apply moisturizing lotion as directed RONIT, MARCZAK ([id number]) [id number] Page 6 of 12 Topical: Gentamicin 1 x Per Week/30 Days Discharge Instructions: As directed by physician Prim Dressing: Endoform 2x2 in (Dispense As Written) 1 x Per Week/30 Days ary Discharge Instructions: Moisten with saline Secondary Dressing: CarboFLEX Odor Control Dressing, 4x4 in 1 x Per Week/30 Days Discharge Instructions: Apply over primary dressing as directed. Secondary Dressing: Drawtex 4x4 in 1 x Per Week/30 Days Discharge Instructions: Apply over primary dressing as directed. Secondary Dressing: Zetuvit Plus 4x8 in 1 x Per Week/30 Days Discharge Instructions: Apply over primary dressing as directed. Compression Wrap: Urgo  K2, (equivalent to a 4 layer) two layer compression system, regular 1 x Per Week/30 Days Discharge Instructions: Apply Urgo K2 as directed (alternative to 4 layer compression). Electronic Signature(s) Signed: 10/12/2022 5:35:47 PM By: Allen Derry PA-C Signed: 10/12/2022 5:53:00 PM By: Shawn Stall RN, BSN Entered By: Shawn Stall on 10/12/2022 12:58:37 -------------------------------------------------------------------------------- Problem List Details Patient Name: Date of Service: Guardia, [name] 10/12/2022 2:45 PM Medical Record Number: [id number] Patient Account Number: [id number] Date of Birth/Sex: Treating RN: 05/20/1955 (67 y.o. Harlon Flor, Millard.Loa Primary Care Provider: [name] Other Clinician: Referring Provider: Treating Provider/Extender: Alver Fisher in Treatment: 62 Active Problems ICD-10 Encounter Code Description Active Date MDM Diagnosis I89.0 Lymphedema, not elsewhere classified 08/04/2021 No Yes I87.333 Chronic venous hypertension (idiopathic) with ulcer and inflammation of 08/04/2021 No Yes bilateral lower extremity L97.828 Non-pressure chronic ulcer of other part of left lower leg with other specified 08/04/2021 No Yes severity L97.818 Non-pressure chronic ulcer of other part of right lower leg with other specified 08/04/2021 No Yes severity E11.621 Type 2 diabetes mellitus with foot ulcer 08/04/2021 No Yes  JARRY, MANON ([id number]) [id number] Page 1 of 12 Visit Report for 10/12/2022 Chief Complaint Document Details Patient Name: Date of Service: LUCIFER, SOJA 10/12/2022 2:45 PM Medical Record Number: [id number] Patient Account Number: [id number] Date of Birth/Sex: Treating RN: May 15, 1955 (67 y.o. M) Primary Care Provider: [name] Other Clinician: Referring Provider: Treating Provider/Extender: Alver Fisher in Treatment: 08 Information Obtained from: Patient Chief Complaint 08/04/2021; patient returns to clinic with bilateral leg wounds as well as areas on the right foot Electronic Signature(s) Signed: 10/12/2022 3:36:41 PM By: Allen Derry PA-C Entered By: Allen Derry on 10/12/2022 12:36:41 -------------------------------------------------------------------------------- HPI Details Patient Name: Date of Service: Hyser, [name] 10/12/2022 2:45 PM Medical Record Number: [id number] Patient Account Number: [id number] Date of Birth/Sex: Treating RN: Aug 03, 1955 (67 y.o. M) Primary Care Provider: [name] Other Clinician: Referring Provider: Treating Provider/Extender: Alver Fisher in Treatment: 21 History of Present Illness HPI Description: ADMISSION 03/22/2021 This is a 67 year old man with a past medical history significant for diabetes type 2, congestive heart failure, peripheral arterial disease, morbid obesity, venous insufficiency, and coronary artery disease. He has been followed by Dr. Loreta Ave in podiatry, who performed a transmetatarsal amputation on the left foot in August 2022. He had issues healing that wound, but based upon Dr. Kenna Gilbert notes, ultimately the TMA wound healed. During his recovery from that surgery, however, ulcers opened up over the DIP joint of the right second and third toe. These have apparently closed and reopened multiple times. It sounds like one of the issues has been  moisture accumulation and maceration of the tissues causing them to reopen. At his last visit with Dr. Loreta Ave, on March 01, 2021, there continues to be problems with moisture and he was referred to wound care for further evaluation and management. He had a formal aortogram with runoff performed prior to his TMA. The findings are copied here: Patient has inline flow to both feet with no significant flow-limiting lesion that would be amenable to percutaneous or open revascularization. He does have an element of small vessel disease and has a short segment occlusion of the distal anterior tibial/dorsalis pedis artery on the left foot but does have posterior tibial artery flow. Would recommend management of wounds with amputation of toes 2 and 3 on the right foot if the wounds do not heal and deteriorate. Transmetatarsal amputation on the left side has as good a blood supply as it is going to get and hopefully this will heal in the future. Formal ABIs were done in January 2023. They are normal bilaterally. ABI Findings: +---------+------------------+-----+----------+--------+ Right Rt Pressure (mmHg)IndexWaveform Comment  +---------+------------------+-----+----------+--------+ Brachial 160     +---------+------------------+-----+----------+--------+ PTA 192 1.06 monophasic  +---------+------------------+-----+----------+--------+ DP 159 0.88 monophasic  +---------+------------------+-----+----------+--------+ Great T oe145 0.80    +---------+------------------+-----+----------+--------+ +--------+------------------+-----+---------+-------+ Rozetta Nunnery D ([id number]) [id number] Page 2 of 12 Left Lt Pressure (mmHg)IndexWaveform Comment +--------+------------------+-----+---------+-------+ [id number]     +--------+------------------+-----+---------+-------+ PTA 204 1.13 triphasic   +--------+------------------+-----+---------+-------+ DP 194 1.07 biphasic   +--------+------------------+-----+---------+-------+ +-------+-----------+-----------+------------+------------+ ABI/TBIT oday's ABIT oday's TBIPrevious ABIPrevious TBI +-------+-----------+-----------+------------+------------+ Right 1.06 0.8 1.26 0.65  +-------+-----------+-----------+------------+------------+ Left 1.13 amputation 1.15 amputation  +-------+-----------+-----------+------------+------------+ Previous ABI on 08/06/20 at [facility] Pedal pressures falsely elevated due to medial calcification. Summary: Right: Resting right ankle-brachial index is within normal range. The right toe-brachial index is normal. Left: Resting left ankle-brachial index is within normal range. READMISSION 08/04/2021 Mr. Pettingill is now a 67 year old man who I remember from this clinic many years ago I  with where we stand today. Fortunately I do not see any signs of active infection which is great news. 09-14-2022 upon evaluation today patient appears to be doing well current regard to his wound with section showing signs of improvement would be using endoform which I think is really doing a great job here. Fortunately I do not see any signs of worsening overall. 09-21-2022 upon evaluation today patient appears to be doing well currently in regard to his wound. He has been tolerating the dressing changes without complication. Fortunately there does not appear to be any signs of active infection at this time which is good news. 09-28-2022 upon evaluation today patient appears to be doing well currently in regard to his wound which is actually measuring quite a bit smaller. This looks to be doing well and very pleased with where things stand today I do not see any signs of active infection locally or systemically which is great news. No  fevers, chills, nausea, vomiting, or diarrhea. 10-05-2022 upon evaluation today patient appears to be doing excellent in regard to his wounds. He has been tolerating the dressing changes without complication and in general I do believe that we will make an really good headway towards complete closure. 10/12/2022 upon evaluation today patient's wound is actually showing signs of excellent improvement. I am actually very pleased with where we stand I think that he is moving in the right direction and this is excellent news. I do not see any evidence of worsening overall Electronic Signature(s) Signed: 10/12/2022 5:16:44 PM By: Allen Derry PA-C Entered By: Allen Derry on 10/12/2022 14:16:44 -------------------------------------------------------------------------------- Physical Exam Details Patient Name: Date of Service: Ghee, [name] 10/12/2022 2:45 PM Medical Record Number: [id number] Patient Account Number: [id number] Date of Birth/Sex: Treating RN: 03-11-1955 (67 y.o. M) Primary Care Provider: [name] Other Clinician: Referring Provider: Treating Provider/Extender: Alver Fisher in Treatment: 34 Constitutional Well-nourished and well-hydrated in no acute distress. Respiratory normal breathing without difficulty. Psychiatric this patient is able to make decisions and demonstrates good insight into disease process. Alert and Oriented x 3. pleasant and cooperative. Notes Upon inspection patient's wound bed actually showed signs of excellent granulation epithelization there is no signs of infection and in general I do believe that he is really doing quite well. JACIEL, DIEM ([id number]) [id number] Page 5 of 12 Electronic Signature(s) Signed: 10/12/2022 5:17:09 PM By: Allen Derry PA-C Entered By: Allen Derry on 10/12/2022 14:17:09 -------------------------------------------------------------------------------- Physician Orders  Details Patient Name: Date of Service: Krichbaum, [name] 10/12/2022 2:45 PM Medical Record Number: [id number] Patient Account Number: [id number] Date of Birth/Sex: Treating RN: [birth year] (67 y.o. Harlon Flor, Millard.Loa Primary Care Provider: [name] Other Clinician: Referring Provider: Treating Provider/Extender: Alver Fisher in Treatment: 73 The following information was scribed by: Shawn Stall The information was scribed for: Lenda Kelp Verbal / Phone Orders: No Diagnosis Coding ICD-10 Coding Code Description I89.0 Lymphedema, not elsewhere classified I87.333 Chronic venous hypertension (idiopathic) with ulcer and inflammation of bilateral lower extremity L97.828 Non-pressure chronic ulcer of other part of left lower leg with other specified severity L97.818 Non-pressure chronic ulcer of other part of right lower leg with other specified severity E11.621 Type 2 diabetes mellitus with foot ulcer L97.518 Non-pressure chronic ulcer of other part of right foot with other specified severity Follow-up Appointments ppointment in 1 week. Leonard Schwartz Wednesday 10/19/2022 (already scheduled) Return A ppointment in 2 weeks. Leonard Schwartz Wednesday (already scheduled) Return A Return  Appointment in 2 weeks. Leonard Schwartz Wednesday (already scheduled) Return appointment in 3 weeks. Leonard Schwartz Wednesday 10/26/2022 (already scheduled) Anesthetic: (In clinic) Topical Lidocaine 5% applied to wound bed - Used in Clinic Prior to debridement (In clinic) Topical Lidocaine 4% applied to wound bed Cellular or Tissue Based Products: Cellular or Tissue Based Product Type: - Run IVR for Kerrecis- denied #1 Apligraf applied 02/23/2022 #2 Apligraf applied 03/02/2022 #3 Apligraf applied 03/16/2022 #4 Apligraf applied 03/30/2022 HOLD APLIGRAF this week. Cellular or Tissue Based Product applied to wound bed, secured with steri-strips, cover with Adaptic or Mepitel. (DO NOT REMOVE). Bathing/ Shower/ Hygiene: May shower with protection but do not get wound dressing(s) wet. Protect dressing(s) with water repellant cover (for example, large plastic bag) or a cast cover and may then take shower. Edema Control - Lymphedema / SCD / Other: Elevate legs to the level of the heart or above for 30 minutes daily and/or when sitting for 3-4 times a day throughout the day. Avoid standing for long periods of time. Patient to wear own compression stockings every day. - apply in the morning and remove at night left leg. Exercise regularly Moisturize legs daily. - apply every night before bed. Compression stocking or Garment 30-40 mm/Hg pressure to: - wear your VIVE compression stocking to left leg. apply in the morning and remove at night. WOUND #7: - Malleolus Wound  Laterality: Right, Medial Cleanser: Soap and Water 1 x Per Week/30 Days Discharge Instructions: May shower and wash wound with dial antibacterial soap and water prior to dressing change. Cleanser: Vashe 5.8 (oz) 1 x Per Week/30 Days Discharge Instructions: Cleanse the wound with Vashe prior to applying a clean dressing using gauze sponges, not tissue or cotton balls. Peri-Wound Care: Sween Lotion (Moisturizing lotion) 1 x Per Week/30 Days Discharge Instructions: Apply moisturizing lotion as directed Topical: Gentamicin 1 x Per Week/30 Days Discharge Instructions: As directed by physician Prim Dressing: Endoform 2x2 in (Dispense As Written) 1 x Per Week/30 Days ary Discharge Instructions: Moisten with saline Secondary Dressing: CarboFLEX Odor Control Dressing, 4x4 in 1 x Per Week/30 Days Discharge Instructions: Apply over primary dressing as directed. Secondary Dressing: Drawtex 4x4 in 1 x Per Week/30 Days Discharge Instructions: Apply over primary dressing as directed. Secondary Dressing: Zetuvit Plus 4x8 in 1 x Per Week/30 Days Discharge Instructions: Apply over primary dressing as directed. Com pression Wrap: Urgo K2, (equivalent to a 4 layer) two layer compression system, regular 1 x Per Week/30 Days Discharge Instructions: Apply Urgo K2 as directed (alternative to 4 layer compression). 1. I would recommend currently that we have the patient continue to monitor for any evidence of infection or worsening. Overall based on what I am seeing I do believe that we are making really good headway towards closure I think endoform is doing great. 2. I am then recommend as well that the patient should continue with the compression wrap. We are using the Urgo K2 compression wrap which is doing an awesome job. We will see patient back for reevaluation in 1 week here in the clinic. If anything worsens or changes patient will contact our office for additional recommendations. Electronic  Signature(s) Signed: 10/12/2022 5:17:28 PM By: Allen Derry PA-C Entered By: Allen Derry on 10/12/2022 14:17:27 -------------------------------------------------------------------------------- SuperBill Details Patient Name: Date of Service: Sthilaire, [name] 10/12/2022 Medical Record Number: [id number] Patient Account Number: [id number] Date of Birth/Sex: Treating RN: [birth year] (67 y.o. [name] Primary Care Provider: [name] Other Clinician: Referring Provider: Treating Provider/Extender:

## 2022-10-13 NOTE — Progress Notes (Signed)
HORRIS, SPEROS ([id number]) [id number] Page 1 of 7 Visit Report for 10/12/2022 Arrival Information Details Patient Name: Date of Service: [name], [name] 10/12/2022 2:45 PM Medical Record Number: [id number] Patient Account Number: [id number] Date of Birth/Sex: Treating RN: May 06, 1955 (68 y.o. M) Primary Care Menelik Mcfarren: [name] Other Clinician: Referring Yuriel Lopezmartinez: Treating Frederic Tones/Extender: Alver Fisher in Treatment: 62 Visit Information History Since Last Visit Added or deleted any medications: No Patient Arrived: [name] Any new allergies or adverse reactions: No Arrival Time: 15:37 Had a fall or experienced change in No Accompanied By: self activities of daily living that may affect Transfer Assistance: None risk of falls: Patient Identification Verified: Yes Signs or symptoms of abuse/neglect since last visito No Secondary Verification Process Completed: Yes Hospitalized since last visit: No Patient Requires Transmission-Based Precautions: No Implantable device outside of the clinic excluding No Patient Has Alerts: Yes cellular tissue based products placed in the center Patient Alerts: Patient on Blood Thinner since last visit: 01/2021 ABI L 1.13 R 1.06 Has Dressing in Place as Prescribed: Yes 01/2021 TBI L amp R 0.8 Has Compression in Place as Prescribed: Yes Pain Present Now: No Electronic Signature(s) Signed: 10/13/2022 3:10:19 PM By: Karie Schwalbe RN Signed: 10/13/2022 3:52:59 PM By: Thayer Dallas Entered By: Thayer Dallas on 10/12/2022 12:38:22 -------------------------------------------------------------------------------- Compression Therapy Details Patient Name: Date of Service: [name] Craven D. 10/12/2022 2:45 PM Medical Record Number: [id number] Patient Account Number: [id number] Date of Birth/Sex: Treating RN: 16-Jul-1955 (67 y.o. [name] Primary Care Elesha Thedford: [name] Other Clinician: Referring  Bright Spielmann: Treating Keeyon Privitera/Extender: Alver Fisher in Treatment: 62 Compression Therapy Performed for Wound Assessment: Wound #7 Right,Medial Malleolus Performed By: Clinician Shawn Stall, RN Compression Type: Double Layer Post Procedure Diagnosis Same as Pre-procedure Electronic Signature(s) Signed: 10/12/2022 5:53:00 PM By: Shawn Stall RN, BSN Entered By: Shawn Stall on 10/12/2022 12:57:04 Woodle, [name] ([id number]) [id number] Page 2 of 7 -------------------------------------------------------------------------------- Encounter Discharge Information Details Patient Name: Date of Service: [name], [name] 10/12/2022 2:45 PM Medical Record Number: [id number] Patient Account Number: [id number] Date of Birth/Sex: Treating RN: 1955-08-02 (67 y.o. [name] Primary Care Zarin Hagmann: [name] Other Clinician: Referring Chanette Demo: Treating Kengo Sturges/Extender: Alver Fisher in Treatment: 80 Encounter Discharge Information Items Discharge Condition: Stable Ambulatory Status: Walker Discharge Destination: Home Transportation: Private Auto Accompanied By: self Schedule Follow-up Appointment: Yes Clinical Summary of Care: Electronic Signature(s) Signed: 10/12/2022 5:53:00 PM By: Shawn Stall RN, BSN Entered By: Shawn Stall on 10/12/2022 12:59:18 -------------------------------------------------------------------------------- Lower Extremity Assessment Details Patient Name: Date of Service: [name], [name] 10/12/2022 2:45 PM Medical Record Number: [id number] Patient Account Number: [id number] Date of Birth/Sex: Treating RN: 10-09-1955 (67 y.o. M) Primary Care Erlean Mealor: [name] Other Clinician: Referring Shritha Bresee: Treating Felipe Cabell/Extender: Alver Fisher in Treatment: 62 Edema Assessment Assessed: [Left: No] [Right: No] Edema: [Left: N] [Right: o] Calf Left: Right: Point  of Measurement: 31 cm From Medial Instep 38 cm Ankle Left: Right: Point of Measurement: 11 cm From Medial Instep 25 cm Vascular Assessment Extremity colors, hair growth, and conditions: Extremity Color: [Right:Normal] Hair Growth on Extremity: [Right:No] Temperature of Extremity: [Right:Warm] Capillary Refill: [Right:< 3 seconds] Dependent Rubor: [Right:No Yes] Electronic Signature(s) Signed: 10/13/2022 3:10:19 PM By: Karie Schwalbe RN Signed: 10/13/2022 3:52:59 PM By: Thayer Dallas Entered By: Thayer Dallas on 10/12/2022 12:40:47 Dunshee, Emon D ([id number]) [id number] Page 3 of 7 -------------------------------------------------------------------------------- Multi Wound Chart Details Patient Name: Date of Service:  HORRIS, SPEROS ([id number]) [id number] Page 1 of 7 Visit Report for 10/12/2022 Arrival Information Details Patient Name: Date of Service: [name], [name] 10/12/2022 2:45 PM Medical Record Number: [id number] Patient Account Number: [id number] Date of Birth/Sex: Treating RN: May 06, 1955 (68 y.o. M) Primary Care Menelik Mcfarren: [name] Other Clinician: Referring Yuriel Lopezmartinez: Treating Frederic Tones/Extender: Alver Fisher in Treatment: 62 Visit Information History Since Last Visit Added or deleted any medications: No Patient Arrived: [name] Any new allergies or adverse reactions: No Arrival Time: 15:37 Had a fall or experienced change in No Accompanied By: self activities of daily living that may affect Transfer Assistance: None risk of falls: Patient Identification Verified: Yes Signs or symptoms of abuse/neglect since last visito No Secondary Verification Process Completed: Yes Hospitalized since last visit: No Patient Requires Transmission-Based Precautions: No Implantable device outside of the clinic excluding No Patient Has Alerts: Yes cellular tissue based products placed in the center Patient Alerts: Patient on Blood Thinner since last visit: 01/2021 ABI L 1.13 R 1.06 Has Dressing in Place as Prescribed: Yes 01/2021 TBI L amp R 0.8 Has Compression in Place as Prescribed: Yes Pain Present Now: No Electronic Signature(s) Signed: 10/13/2022 3:10:19 PM By: Karie Schwalbe RN Signed: 10/13/2022 3:52:59 PM By: Thayer Dallas Entered By: Thayer Dallas on 10/12/2022 12:38:22 -------------------------------------------------------------------------------- Compression Therapy Details Patient Name: Date of Service: [name] Craven D. 10/12/2022 2:45 PM Medical Record Number: [id number] Patient Account Number: [id number] Date of Birth/Sex: Treating RN: 16-Jul-1955 (67 y.o. [name] Primary Care Elesha Thedford: [name] Other Clinician: Referring  Bright Spielmann: Treating Keeyon Privitera/Extender: Alver Fisher in Treatment: 62 Compression Therapy Performed for Wound Assessment: Wound #7 Right,Medial Malleolus Performed By: Clinician Shawn Stall, RN Compression Type: Double Layer Post Procedure Diagnosis Same as Pre-procedure Electronic Signature(s) Signed: 10/12/2022 5:53:00 PM By: Shawn Stall RN, BSN Entered By: Shawn Stall on 10/12/2022 12:57:04 Woodle, [name] ([id number]) [id number] Page 2 of 7 -------------------------------------------------------------------------------- Encounter Discharge Information Details Patient Name: Date of Service: [name], [name] 10/12/2022 2:45 PM Medical Record Number: [id number] Patient Account Number: [id number] Date of Birth/Sex: Treating RN: 1955-08-02 (67 y.o. [name] Primary Care Zarin Hagmann: [name] Other Clinician: Referring Chanette Demo: Treating Kengo Sturges/Extender: Alver Fisher in Treatment: 80 Encounter Discharge Information Items Discharge Condition: Stable Ambulatory Status: Walker Discharge Destination: Home Transportation: Private Auto Accompanied By: self Schedule Follow-up Appointment: Yes Clinical Summary of Care: Electronic Signature(s) Signed: 10/12/2022 5:53:00 PM By: Shawn Stall RN, BSN Entered By: Shawn Stall on 10/12/2022 12:59:18 -------------------------------------------------------------------------------- Lower Extremity Assessment Details Patient Name: Date of Service: [name], [name] 10/12/2022 2:45 PM Medical Record Number: [id number] Patient Account Number: [id number] Date of Birth/Sex: Treating RN: 10-09-1955 (67 y.o. M) Primary Care Erlean Mealor: [name] Other Clinician: Referring Shritha Bresee: Treating Felipe Cabell/Extender: Alver Fisher in Treatment: 62 Edema Assessment Assessed: [Left: No] [Right: No] Edema: [Left: N] [Right: o] Calf Left: Right: Point  of Measurement: 31 cm From Medial Instep 38 cm Ankle Left: Right: Point of Measurement: 11 cm From Medial Instep 25 cm Vascular Assessment Extremity colors, hair growth, and conditions: Extremity Color: [Right:Normal] Hair Growth on Extremity: [Right:No] Temperature of Extremity: [Right:Warm] Capillary Refill: [Right:< 3 seconds] Dependent Rubor: [Right:No Yes] Electronic Signature(s) Signed: 10/13/2022 3:10:19 PM By: Karie Schwalbe RN Signed: 10/13/2022 3:52:59 PM By: Thayer Dallas Entered By: Thayer Dallas on 10/12/2022 12:40:47 Dunshee, Emon D ([id number]) [id number] Page 3 of 7 -------------------------------------------------------------------------------- Multi Wound Chart Details Patient Name: Date of Service:  Thickness With Exposed Suppo Wound Margin: Distinct, outline attached Exudate Amount: Medium Exudate Type: Serosanguineous Exudate Color: red, brown rt Structures Foul Odor  After Cleansing: No Slough/Fibrino Yes Wound Bed Granulation Amount: Large (67-100%) Exposed Structure Granulation Quality: Red Fascia Exposed: No Necrotic Amount: Small (1-33%) Fat Layer (Subcutaneous Tissue) Exposed: Yes Necrotic Quality: Adherent Slough Tendon Exposed: No Muscle Exposed: No Joint Exposed: No Bone Exposed: No [address] ROOK, MAUE D ([id number]) [id number] Page 6 of 7 Texture Color No Abnormalities Noted: No No Abnormalities Noted: No Callus: Yes Atrophie Blanche: No Crepitus: No Cyanosis: No Excoriation: No Ecchymosis: No Induration: No Erythema: No Rash: No Hemosiderin Staining: No Scarring: No Mottled: No Pallor: No Moisture Rubor: No No Abnormalities Noted: No Dry / Scaly: Yes Temperature / Pain Maceration: No Temperature: No Abnormality Treatment Notes Wound #7 (Malleolus) Wound Laterality: Right, Medial Cleanser Soap and Water Discharge Instruction: May shower and wash wound with dial antibacterial soap and water prior to dressing change. Vashe 5.8 (oz) Discharge Instruction: Cleanse the wound with Vashe prior to applying a clean dressing using gauze sponges, not tissue or cotton balls. Peri-Wound Care Sween Lotion (Moisturizing lotion) Discharge Instruction: Apply moisturizing lotion as directed Topical Gentamicin Discharge Instruction: As directed by physician Primary Dressing Endoform 2x2 in Discharge Instruction: Moisten with saline Secondary Dressing CarboFLEX Odor Control Dressing, 4x4 in Discharge Instruction: Apply over primary dressing as directed. Drawtex 4x4 in Discharge Instruction: Apply over primary dressing as directed. Zetuvit Plus 4x8 in Discharge Instruction: Apply over primary dressing as directed. Secured With Compression Wrap Urgo K2, (equivalent to a 4 layer) two layer compression system, regular Discharge Instruction: Apply Urgo K2 as directed (alternative to 4 layer  compression). Compression Stockings Add-Ons Electronic Signature(s) Signed: 10/13/2022 3:10:19 PM By: Karie Schwalbe RN Signed: 10/13/2022 3:52:59 PM By: Thayer Dallas Entered By: Thayer Dallas on 10/12/2022 12:45:08 -------------------------------------------------------------------------------- Vitals Details Patient Name: Date of Service: Rodenberg, [name] 10/12/2022 2:45 PM Medical Record Number: [id number] Patient Account Number: [id number] Date of Birth/Sex: Treating RN: 1955-06-16 (67 y.o. M) Primary Care Clester Chlebowski: [name] Other Clinician: Referring Desere Gwin: Treating Jessilynn Taft/Extender: Alver Fisher in Treatment: [address] [place] D ([id number]) [id number] Page 7 of 7 Vital Signs Time Taken: 15:44 Temperature (F): 98.5 Height (in): 71 Pulse (bpm): 78 Weight (lbs): 350 Respiratory Rate (breaths/min): 18 Body Mass Index (BMI): 48.8 Blood Pressure (mmHg): 113/72 Reference Range: 80 - 120 mg / dl Electronic Signature(s) Signed: 10/13/2022 3:10:19 PM By: Karie Schwalbe RN Signed: 10/13/2022 3:52:59 PM By: Thayer Dallas Entered By: Thayer Dallas on 10/12/2022 12:44:18

## 2022-10-19 ENCOUNTER — Encounter (HOSPITAL_BASED_OUTPATIENT_CLINIC_OR_DEPARTMENT_OTHER): Payer: Medicare Other | Admitting: Physician Assistant

## 2022-10-19 DIAGNOSIS — M109 Gout, unspecified: Secondary | ICD-10-CM | POA: Diagnosis not present

## 2022-10-19 DIAGNOSIS — Z8572 Personal history of non-Hodgkin lymphomas: Secondary | ICD-10-CM | POA: Diagnosis not present

## 2022-10-19 DIAGNOSIS — Z89432 Acquired absence of left foot: Secondary | ICD-10-CM | POA: Diagnosis not present

## 2022-10-19 DIAGNOSIS — I87333 Chronic venous hypertension (idiopathic) with ulcer and inflammation of bilateral lower extremity: Secondary | ICD-10-CM | POA: Diagnosis not present

## 2022-10-19 DIAGNOSIS — L97312 Non-pressure chronic ulcer of right ankle with fat layer exposed: Secondary | ICD-10-CM | POA: Diagnosis not present

## 2022-10-19 DIAGNOSIS — I872 Venous insufficiency (chronic) (peripheral): Secondary | ICD-10-CM | POA: Diagnosis not present

## 2022-10-19 DIAGNOSIS — Z950 Presence of cardiac pacemaker: Secondary | ICD-10-CM | POA: Diagnosis not present

## 2022-10-19 DIAGNOSIS — Z7901 Long term (current) use of anticoagulants: Secondary | ICD-10-CM | POA: Diagnosis not present

## 2022-10-19 DIAGNOSIS — I2510 Atherosclerotic heart disease of native coronary artery without angina pectoris: Secondary | ICD-10-CM | POA: Diagnosis not present

## 2022-10-19 DIAGNOSIS — I890 Lymphedema, not elsewhere classified: Secondary | ICD-10-CM | POA: Diagnosis not present

## 2022-10-19 DIAGNOSIS — L97828 Non-pressure chronic ulcer of other part of left lower leg with other specified severity: Secondary | ICD-10-CM | POA: Diagnosis not present

## 2022-10-19 DIAGNOSIS — Z6841 Body Mass Index (BMI) 40.0 and over, adult: Secondary | ICD-10-CM | POA: Diagnosis not present

## 2022-10-19 DIAGNOSIS — I4891 Unspecified atrial fibrillation: Secondary | ICD-10-CM | POA: Diagnosis not present

## 2022-10-19 DIAGNOSIS — I509 Heart failure, unspecified: Secondary | ICD-10-CM | POA: Diagnosis not present

## 2022-10-19 DIAGNOSIS — L97518 Non-pressure chronic ulcer of other part of right foot with other specified severity: Secondary | ICD-10-CM | POA: Diagnosis not present

## 2022-10-19 DIAGNOSIS — E11621 Type 2 diabetes mellitus with foot ulcer: Secondary | ICD-10-CM | POA: Diagnosis not present

## 2022-10-19 DIAGNOSIS — L97818 Non-pressure chronic ulcer of other part of right lower leg with other specified severity: Secondary | ICD-10-CM | POA: Diagnosis not present

## 2022-10-19 DIAGNOSIS — I110 Hypertensive heart disease with heart failure: Secondary | ICD-10-CM | POA: Diagnosis not present

## 2022-10-19 DIAGNOSIS — E1151 Type 2 diabetes mellitus with diabetic peripheral angiopathy without gangrene: Secondary | ICD-10-CM | POA: Diagnosis not present

## 2022-10-19 DIAGNOSIS — M1990 Unspecified osteoarthritis, unspecified site: Secondary | ICD-10-CM | POA: Diagnosis not present

## 2022-10-19 NOTE — Progress Notes (Signed)
WAKE, ACRE ([id number]) [id number] Page 1 of 5 Visit Report for 10/19/2022 Chief Complaint Document Details Patient Name: Date of Service: [name], [name] 10/19/2022 2:45 PM Medical Record Number: [id number] Patient Account Number: [id number] Date of Birth/Sex: Treating RN: 1955-02-15 (67 y.o. M) Primary Care Provider: [name] Other Clinician: Referring Provider: Treating Provider/Extender: Alver Fisher in Treatment: 78 Information Obtained from: Patient Chief Complaint 08/04/2021; patient returns to clinic with bilateral leg wounds as well as areas on the right foot Electronic Signature(s) Signed: 10/19/2022 2:47:38 PM By: Allen Derry PA-C Entered By: Allen Derry on 10/19/2022 11:47:38 -------------------------------------------------------------------------------- Debridement Details Patient Name: Date of Service: [name], [name] 10/19/2022 2:45 PM Medical Record Number: [id number] Patient Account Number: [id number] Date of Birth/Sex: Treating RN: Aug 11, 1955 (67 y.o. [name] Primary Care Provider: [name] Other Clinician: Referring Provider: Treating Provider/Extender: Alver Fisher in Treatment: 63 Debridement Performed for Assessment: Wound #7 Right,Medial Malleolus Performed By: Physician Lenda Kelp, PA The following information was scribed by: Shawn Stall The information was scribed for: Lenda Kelp Debridement Type: Debridement Severity of Tissue Pre Debridement: Fat layer exposed Level of Consciousness (Pre-procedure): Awake and Alert Pre-procedure Verification/Time Out Yes - 15:10 Taken: Start Time: 15:11 Pain Control: Lidocaine 4% T opical Solution Percent of Wound Bed Debrided: 100% T Area Debrided (cm): otal 3.77 Tissue and other material debrided: Viable, Non-Viable, Slough, Subcutaneous, Skin: Dermis , Skin: Epidermis, Slough Level: Skin/Subcutaneous  Tissue Debridement Description: Excisional Instrument: Curette Bleeding: Minimum Hemostasis Achieved: Pressure End Time: 15:19 Procedural Pain: 0 Post Procedural Pain: 0 Response to Treatment: Procedure was tolerated well Level of Consciousness (Post- Awake and Alert procedure): Post Debridement Measurements of Total Wound Length: (cm) 3.2 Width: (cm) 1.5 Depth: (cm) 0.2 Fiebelkorn, Kwali [name] ([id number]) [id number] Page 2 of 5 Volume: (cm) 0.754 Character of Wound/Ulcer Post Debridement: Improved Severity of Tissue Post Debridement: Fat layer exposed Post Procedure Diagnosis Same as Pre-procedure Electronic Signature(s) Unsigned Entered By: Shawn Stall on 10/19/2022 12:20:19 -------------------------------------------------------------------------------- Physician Orders Details Patient Name: Date of Service: [name], [name]. 10/19/2022 2:45 PM Medical Record Number: [id number] Patient Account Number: [id number] Date of Birth/Sex: Treating RN: March 14, 1955 (67 y.o. Harlon Flor, Millard.Loa Primary Care Provider: [name] Other Clinician: Referring Provider: Treating Provider/Extender: Alver Fisher in Treatment: 22 The following information was scribed by: Shawn Stall The information was scribed for: Lenda Kelp Verbal / Phone Orders: No Diagnosis Coding ICD-10 Coding Code Description I89.0 Lymphedema, not elsewhere classified I87.333 Chronic venous hypertension (idiopathic) with ulcer and inflammation of bilateral lower extremity L97.828 Non-pressure chronic ulcer of other part of left lower leg with other specified severity L97.818 Non-pressure chronic ulcer of other part of right lower leg with other specified severity E11.621 Type 2 diabetes mellitus with foot ulcer L97.518 Non-pressure chronic ulcer of other part of right foot with other specified severity Follow-up Appointments ppointment in 1 week. Leonard Schwartz Wednesday  10/26/2022 (already scheduled) Return A ppointment in 2 weeks. Leonard Schwartz Wednesday 11/02/2022 (already scheduled) Return A Return appointment in 3 weeks. Leonard Schwartz Wednesday [facility] office to schedule) Return appointment in 1 month. Leonard Schwartz Wednesday [facility] office to schedule) Anesthetic (In clinic) Topical Lidocaine 5% applied to wound bed - Used in Clinic Prior to debridement (In clinic) Topical Lidocaine 4% applied to wound bed Cellular or Tissue Based Products Cellular or Tissue Based Product Type: - Run IVR for Kerrecis- denied #1 Apligraf applied 02/23/2022 #2  Apligraf applied 03/02/2022 #3 Apligraf applied 03/16/2022 #4 Apligraf applied 03/30/2022 HOLD APLIGRAF this week. Cellular or Tissue Based Product applied to wound bed, secured with steri-strips, cover with Adaptic or Mepitel. (DO NOT REMOVE). Bathing/ Shower/ Hygiene May shower with protection but do not get wound dressing(s) wet. Protect dressing(s) with water repellant cover (for example, large plastic bag) or a cast cover and may then take shower. Edema Control - Lymphedema / SCD / Other Elevate legs to the level of the heart or above for 30 minutes daily and/or when sitting for 3-4 times a day throughout the day. Avoid standing for long periods of time. Patient to wear own compression stockings every day. - apply in the morning and remove at night left leg. Exercise regularly Moisturize legs daily. - apply every night before bed. Compression stocking or Garment 30-40 mm/Hg pressure to: - wear your VIVE compression stocking to left leg. apply in the morning and remove at night. [name], [name] ([id number]) [id number] Page 3 of 5 Wound Treatment Wound #7 - Malleolus Wound Laterality: Right, Medial Cleanser: Soap and Water 1 x Per Week/30 Days Discharge Instructions: May shower and wash wound with dial antibacterial soap and water prior to dressing change. Cleanser: Vashe 5.8 (oz) 1 x Per Week/30  Days Discharge Instructions: Cleanse the wound with Vashe prior to applying a clean dressing using gauze sponges, not tissue or cotton balls. Peri-Wound Care: Sween Lotion (Moisturizing lotion) 1 x Per Week/30 Days Discharge Instructions: Apply moisturizing lotion as directed Topical: Gentamicin 1 x Per Week/30 Days Discharge Instructions: As directed by physician Prim Dressing: Endoform 2x2 in (Dispense As Written) 1 x Per Week/30 Days ary Discharge Instructions: Moisten with saline Secondary Dressing: CarboFLEX Odor Control Dressing, 4x4 in 1 x Per Week/30 Days Discharge Instructions: Apply over primary dressing as directed. Secondary Dressing: Drawtex 4x4 in 1 x Per Week/30 Days Discharge Instructions: Apply over primary dressing as directed. Secondary Dressing: Zetuvit Plus 4x8 in 1 x Per Week/30 Days Discharge Instructions: Apply over primary dressing as directed. Compression Wrap: Urgo K2, (equivalent to a 4 layer) two layer compression system, regular 1 x Per Week/30 Days Discharge Instructions: Apply Urgo K2 as directed (alternative to 4 layer compression). Electronic Signature(s) Unsigned Entered By: Shawn Stall on 10/19/2022 12:17:52 -------------------------------------------------------------------------------- Problem List Details Patient Name: Date of Service: [name], [name] 10/19/2022 2:45 PM Medical Record Number: [id number] Patient Account Number: [id number] Date of Birth/Sex: Treating RN: 04-26-1955 (67 y.o. M) Primary Care Provider: [name] Other Clinician: Referring Provider: Treating Provider/Extender: Alver Fisher in Treatment: 01 Active Problems ICD-10 Encounter Code Description Active Date MDM Diagnosis I89.0 Lymphedema, not elsewhere classified 08/04/2021 No Yes I87.333 Chronic venous hypertension (idiopathic) with ulcer and inflammation of 08/04/2021 No Yes bilateral lower extremity L97.828 Non-pressure chronic ulcer of  other part of left lower leg with other specified 08/04/2021 No Yes severity L97.818 Non-pressure chronic ulcer of other part of right lower leg with other specified 08/04/2021 No Yes severity [name], [name] ([id number]) [phone].pdf Page 4 of 5 E11.621 Type 2 diabetes mellitus with foot ulcer 08/04/2021 No Yes L97.518 Non-pressure chronic ulcer of other part of right foot with other specified 08/04/2021 No Yes severity Inactive Problems Resolved Problems Electronic Signature(s) Signed: 10/19/2022 2:47:20 PM By: Allen Derry PA-C Entered By: Allen Derry on 10/19/2022 11:47:19 -------------------------------------------------------------------------------- SuperBill Details Patient Name: Date of Service: [name], [name] 10/19/2022 Medical Record Number: [id number] Patient Account Number: [id number] Date of Birth/Sex: Treating RN: 10-12-1955 (67 y.o.  Harlon Flor, Millard.Loa Primary Care Provider: [name] Other Clinician: Referring Provider: Treating Provider/Extender: Alver Fisher in Treatment: 63 Diagnosis Coding ICD-10 Codes Code Description I89.0 Lymphedema, not elsewhere classified I87.333 Chronic venous hypertension (idiopathic) with ulcer and inflammation of bilateral lower extremity L97.828 Non-pressure chronic ulcer of other part of left lower leg with other specified severity L97.818 Non-pressure chronic ulcer of other part of right lower leg with other specified severity E11.621 Type 2 diabetes mellitus with foot ulcer L97.518 Non-pressure chronic ulcer of other part of right foot with other specified severity Facility Procedures : 3 CPT4 Code: 1610960 Description: 11042 - DEB SUBQ TISSUE 20 SQ CM/< ICD-10 Diagnosis Description L97.818 Non-pressure chronic ulcer of other part of right lower leg with other specifie E11.621 Type 2 diabetes mellitus with foot ulcer I87.333 Chronic venous hypertension  (idiopathic) with ulcer and  inflammation of bilater I89.0 Lymphedema, not elsewhere classified Modifier: [name] severity al lower extremity Quantity: 1 Physician Procedures : CPT4 Code Description Modifier [id number] 11042 - WC PHYS SUBQ TISS 20 SQ CM ICD-10 Diagnosis Description L97.818 Non-pressure chronic ulcer of other part of right lower leg with other specified severity E11.621 Type 2 diabetes mellitus with foot ulcer  I87.333 Chronic venous hypertension (idiopathic) with ulcer and inflammation of bilateral lower extremity I89.0 Lymphedema, not elsewhere classified Quantity: [address] [name], [name] ([id number]) [id number] Page 5 of 5 Unsigned Entered By: Shawn Stall on 10/19/2022 12:20:39 Signature(s): Date(s):

## 2022-10-20 ENCOUNTER — Other Ambulatory Visit: Payer: Medicare Other

## 2022-10-20 NOTE — Progress Notes (Signed)
Joint Exposed: No Bone Exposed: No Periwound Skin Texture Texture Color No Abnormalities Noted: No No Abnormalities Noted: No Callus: Yes Atrophie Blanche: No Crepitus:  No Cyanosis: No Excoriation: No Ecchymosis: No Induration: No Erythema: No Rash: No Hemosiderin Staining: No Scarring: No Mottled: No Pallor: No Moisture Rubor: No No Abnormalities Noted: No Dry / Scaly: Yes Temperature / Pain Maceration: No Temperature: No Abnormality Treatment Notes Wound #7 (Malleolus) Wound Laterality: Right, Medial [name], [name] [name] ([id number]) [id number] Page 6 of 6 Soap and Water Discharge Instruction: May shower and wash wound with dial antibacterial soap and water prior to dressing change. Vashe 5.8 (oz) Discharge Instruction: Cleanse the wound with Vashe prior to applying a clean dressing using gauze sponges, not tissue or cotton balls. Peri-Wound Care Sween Lotion (Moisturizing lotion) Discharge Instruction: Apply moisturizing lotion as directed Topical Gentamicin Discharge Instruction: As directed by physician Primary Dressing Endoform 2x2 in Discharge Instruction: Moisten with saline Secondary Dressing CarboFLEX Odor Control Dressing, 4x4 in Discharge Instruction: Apply over primary dressing as directed. Drawtex 4x4 in Discharge Instruction: Apply over primary dressing as directed. Zetuvit Plus 4x8 in Discharge Instruction: Apply over primary dressing as directed. Secured With Compression Wrap Urgo K2, (equivalent to a 4 layer) two layer compression system, regular Discharge Instruction: Apply Urgo K2 as directed (alternative to 4 layer compression). Compression Stockings Add-Ons Electronic Signature(s) Signed: 10/19/2022 5:49:45 PM By: [name] Entered By: [name] on 10/19/2022 12:10:44 -------------------------------------------------------------------------------- Vitals Details Patient Name: Date of Service: [name], DA [name] 10/19/2022 2:45 PM Medical Record Number: [id number] Patient Account Number: [id number] Date of Birth/Sex: Treating RN: 07-Oct-1955 (67 y.o. M) Primary Care  [name]: [name] Other Clinician: Referring [name]: Treating [name]: [name] in Treatment: 63 Vital Signs Time Taken: 14:50 Temperature (F): 98.6 Height (in): 71 Pulse (bpm): 91 Weight (lbs): 350 Respiratory Rate (breaths/min): 18 Body Mass Index (BMI): 48.8 Blood Pressure (mmHg): 138/87 Reference Range: 80 - 120 mg / dl Electronic Signature(s) Signed: 10/19/2022 3:41:06 PM By: [name] Entered By: [name] on 10/19/2022 11:51:13  [name] ([id number]) [id number] Page 1 of 6 Visit Report for 10/19/2022 Arrival Information Details Patient Name: Date of Service: [name] 10/19/2022 2:45 PM Medical Record Number: [id number] Patient Account Number: [id number] Date of Birth/Sex: Treating RN: 05-07-1955 (67 y.o. M) Primary Care Kalani Baray: [name] Other Clinician: Referring [name]: Treating [name]: [name] in Treatment: 66 Visit Information History Since Last Visit Added or deleted any medications: No Patient Arrived: Ambulatory Any new allergies or adverse reactions: No Arrival Time: 14:50 Had a fall or experienced change in No Accompanied By: self activities of daily living that may affect Transfer Assistance: None risk of falls: Patient Identification Verified: Yes Signs or symptoms of abuse/neglect since last visito No Secondary Verification Process Completed: Yes Hospitalized since last visit: No Patient Requires Transmission-Based Precautions: No Implantable device outside of the clinic excluding No Patient Has Alerts: Yes cellular tissue based products placed in the center Patient Alerts: Patient on Blood Thinner since last visit: 01/2021 ABI L 1.13 R 1.06 Has Dressing in Place as Prescribed: Yes 01/2021 TBI L amp R 0.8 Pain Present Now: No Electronic Signature(s) Signed: 10/19/2022 3:41:06 PM By: [name] Entered By: [name] on 10/19/2022 11:50:56 -------------------------------------------------------------------------------- Compression Therapy Details Patient Name: Date of Service: [name] Craven [name]. 10/19/2022 2:45 PM Medical Record Number: [id number] Patient Account Number: [id number] Date of Birth/Sex: Treating RN: 1955/02/23 (67 y.o. [name] Primary Care Sanae Willetts: [name] Other Clinician: Referring Micholas Drumwright: Treating Kimbly Eanes/Extender: [name] in Treatment:  88 Compression Therapy Performed for Wound Assessment: Wound #7 Right,Medial Malleolus Performed By: Clinician [name] Stall, RN Compression Type: Double Layer Post Procedure Diagnosis Same as Pre-procedure Electronic Signature(s) Signed: 10/19/2022 5:49:45 PM By: [name] Entered By: [name] on 10/19/2022 12:11:11 [name] ([id number]) [id number] Page 2 of 6 -------------------------------------------------------------------------------- Encounter Discharge Information Details Patient Name: Date of Service: [name] 10/19/2022 2:45 PM Medical Record Number: [id number] Patient Account Number: [id number] Date of Birth/Sex: Treating RN: [birth year] (67 y.o. [name] Primary Care Hollyn Stucky: [name] Other Clinician: Referring [name]: Treating [name]: [name] in Treatment: 51 Encounter Discharge Information Items Post Procedure Vitals Discharge Condition: Stable Temperature (F): 98.6 Ambulatory Status: Walker Pulse (bpm): 91 Discharge Destination: Home Respiratory Rate (breaths/min): 18 Transportation: Private Auto Blood Pressure (mmHg): 138/87 Accompanied By: self Schedule Follow-up Appointment: Yes Clinical Summary of Care: Electronic Signature(s) Signed: 10/19/2022 5:49:45 PM By: [name] Entered By: [name] on 10/19/2022 12:21:23 -------------------------------------------------------------------------------- Lower Extremity Assessment Details Patient Name: Date of Service: [name]. 10/19/2022 2:45 PM Medical Record Number: [id number] Patient Account Number: [id number] Date of Birth/Sex: Treating RN: Sep 29, 1955 (68 y.o. [name] Primary Care Yanelly Cantrelle: [name] Other Clinician: Referring Jenette Rayson: Treating Maurisha Mongeau/Extender: [name] in Treatment: 63 Edema Assessment Assessed: Kyra Searles: No] Franne Forts:  Yes] Edema: [Left: N] [Right: o] Calf Left: Right: Point of Measurement: 31 cm From Medial Instep 38 cm Ankle Left: Right: Point of Measurement: 11 cm From Medial Instep 25 cm Vascular Assessment Pulses: Dorsalis Pedis Palpable: [Right:Yes] Extremity colors, hair growth, and conditions: Extremity Color: [Right:Normal] Hair Growth on Extremity: [Right:No] Temperature of Extremity: [Right:Warm] Capillary Refill: [Right:< 3 seconds] Dependent Rubor: [Right:No] Blanched when Elevated: [Right:No Yes] Toe Nail Assessment Left: Right: Thick: No Discolored: No Deformed: No Improper Length and Hygiene: No ARMAS, MCBEE [name] ([id number]) [id number] Page 3 of 6 Electronic Signature(s) Signed: 10/19/2022 5:49:45  PM By: [name] Entered By: [name] on 10/19/2022 12:10:22 -------------------------------------------------------------------------------- Multi-Disciplinary Care Plan Details Patient Name: Date of Service: [place], DA [name] 10/19/2022 2:45 PM Medical Record Number: [id number] Patient Account Number: [id number] Date of Birth/Sex: Treating RN: January 02, 1956 (67 y.o. [name] Primary Care Brodie Correll: [name] Other Clinician: Referring Cecile Gillispie: Treating Abia Monaco/Extender: [name] in Treatment: 60 Multidisciplinary Care Plan reviewed with physician Active Inactive Pain, Acute or Chronic Nursing Diagnoses: Pain, acute or chronic: actual or potential Potential alteration in comfort, pain Goals: Patient will verbalize adequate pain control and receive pain control interventions during procedures as needed Date Initiated: 08/04/2021 Target Resolution Date: 01/10/2023 Goal Status: Active Patient/caregiver will verbalize comfort level met Date Initiated: 08/04/2021 Date Inactivated: 07/27/2022 Target Resolution Date: 09/10/2022 Goal Status: Met Interventions: Encourage patient to take pain medications as  prescribed Provide education on pain management Reposition patient for comfort Treatment Activities: Administer pain control measures as ordered : 08/04/2021 Notes: Electronic Signature(s) Signed: 10/19/2022 5:49:45 PM By: [name] Entered By: [name] on 10/19/2022 12:12:49 -------------------------------------------------------------------------------- Pain Assessment Details Patient Name: Date of Service: [place], DA Manuela Neptune [name]. 10/19/2022 2:45 PM Medical Record Number: [id number] Patient Account Number: [id number] Date of Birth/Sex: Treating RN: 1955-10-29 (67 y.o. M) Primary Care Verdean Murin: [name] Other Clinician: Referring Tilley Faeth: Treating Patrisha Hausmann/Extender: [name] in Treatment: [address] [place], Dannielle Huh [name] ([id number]) [id number] Page 4 of 6 Location of Pain Severity and Description of Pain Patient Has Paino No Site Locations Pain Management and Medication Current Pain Management: Electronic Signature(s) Signed: 10/19/2022 3:41:06 PM By: [name] Entered By: [name] on 10/19/2022 11:51:19 -------------------------------------------------------------------------------- Patient/Caregiver Education Details Patient Name: Date of Service: Carlyle Callas 10/9/2024andnbsp2:45 PM Medical Record Number: [id number] Patient Account Number: [id number] Date of Birth/Gender: Treating RN: 07/20/1955 (67 y.o. [name] Primary Care Physician: [name] Other Clinician: Referring Physician: Treating Physician/Extender: [name] in Treatment: 39 Education Assessment Education Provided To: Patient Education Topics Provided Wound/Skin Impairment: Handouts: Caring for Your Ulcer Methods: Explain/Verbal Responses: Reinforcements needed Electronic Signature(s) Signed: 10/19/2022 5:49:45 PM By: [name] Entered By: [name] on 10/19/2022  12:13:02 Wound Assessment Details -------------------------------------------------------------------------------- [name] ([id number]) [id number] Page 5 of 6 Patient Name: Date of Service: ANDRANIK, JEUNE 10/19/2022 2:45 PM Medical Record Number: [id number] Patient Account Number: [id number] Date of Birth/Sex: Treating RN: [birth year] (67 y.o. M) Primary Care Desarae Placide: [name] Other Clinician: Referring Kahlan Engebretson: Treating Iliya Spivack/Extender: [name] in Treatment: 63 Wound Status Wound Number: 7 Primary Venous Leg Ulcer Etiology: Wound Location: Right, Medial Malleolus Secondary Lymphedema Wounding Event: Blister Etiology: Date Acquired: 07/21/2021 Wound Open Weeks Of Treatment: 63 Status: Clustered Wound: No Comorbid Sleep Apnea, Arrhythmia, Congestive Heart Failure, History: Hypertension, Hypotension, Peripheral Arterial Disease, Peripheral Venous Disease, Type II Diabetes, Gout, Osteoarthritis Photos Wound Measurements Length: (cm) 3.2 % Reduction in Area: 90.2% Width: (cm) 1.5 % Reduction in Volume: 97.2% Depth: (cm) 0.2 Epithelialization: Medium (34-66%) Area: (cm) 3.77 Tunneling: No Volume: (cm) 0.754 Undermining: No Wound Description Classification: Full Thickness With Exposed Support Structures Foul Odor After Cleansing: No Wound Margin: Distinct, outline attached Slough/Fibrino Yes Exudate Amount: Medium Exudate Type: Serosanguineous Exudate Color: red, brown Wound Bed Granulation Amount: Large (67-100%) Exposed Structure Granulation Quality: Red Fascia Exposed: No Necrotic Amount: Small (1-33%) Fat Layer (Subcutaneous Tissue) Exposed: Yes Necrotic Quality: Adherent Slough Tendon Exposed: No Muscle Exposed: No

## 2022-10-24 DIAGNOSIS — K080 Exfoliation of teeth due to systemic causes: Secondary | ICD-10-CM | POA: Diagnosis not present

## 2022-10-26 ENCOUNTER — Encounter (HOSPITAL_BASED_OUTPATIENT_CLINIC_OR_DEPARTMENT_OTHER): Payer: Medicare Other | Admitting: Physician Assistant

## 2022-10-26 ENCOUNTER — Other Ambulatory Visit (HOSPITAL_COMMUNITY): Payer: Self-pay | Admitting: Family Medicine

## 2022-10-26 DIAGNOSIS — L97518 Non-pressure chronic ulcer of other part of right foot with other specified severity: Secondary | ICD-10-CM | POA: Diagnosis not present

## 2022-10-26 DIAGNOSIS — I2510 Atherosclerotic heart disease of native coronary artery without angina pectoris: Secondary | ICD-10-CM | POA: Diagnosis not present

## 2022-10-26 DIAGNOSIS — L97312 Non-pressure chronic ulcer of right ankle with fat layer exposed: Secondary | ICD-10-CM | POA: Diagnosis not present

## 2022-10-26 DIAGNOSIS — R001 Bradycardia, unspecified: Secondary | ICD-10-CM

## 2022-10-26 DIAGNOSIS — L97828 Non-pressure chronic ulcer of other part of left lower leg with other specified severity: Secondary | ICD-10-CM | POA: Diagnosis not present

## 2022-10-26 DIAGNOSIS — M1990 Unspecified osteoarthritis, unspecified site: Secondary | ICD-10-CM | POA: Diagnosis not present

## 2022-10-26 DIAGNOSIS — I872 Venous insufficiency (chronic) (peripheral): Secondary | ICD-10-CM | POA: Diagnosis not present

## 2022-10-26 DIAGNOSIS — Z950 Presence of cardiac pacemaker: Secondary | ICD-10-CM | POA: Diagnosis not present

## 2022-10-26 DIAGNOSIS — I87331 Chronic venous hypertension (idiopathic) with ulcer and inflammation of right lower extremity: Secondary | ICD-10-CM | POA: Diagnosis not present

## 2022-10-26 DIAGNOSIS — M109 Gout, unspecified: Secondary | ICD-10-CM | POA: Diagnosis not present

## 2022-10-26 DIAGNOSIS — Z6841 Body Mass Index (BMI) 40.0 and over, adult: Secondary | ICD-10-CM | POA: Diagnosis not present

## 2022-10-26 DIAGNOSIS — I110 Hypertensive heart disease with heart failure: Secondary | ICD-10-CM | POA: Diagnosis not present

## 2022-10-26 DIAGNOSIS — Z8572 Personal history of non-Hodgkin lymphomas: Secondary | ICD-10-CM | POA: Diagnosis not present

## 2022-10-26 DIAGNOSIS — E11621 Type 2 diabetes mellitus with foot ulcer: Secondary | ICD-10-CM | POA: Diagnosis not present

## 2022-10-26 DIAGNOSIS — Z89432 Acquired absence of left foot: Secondary | ICD-10-CM | POA: Diagnosis not present

## 2022-10-26 DIAGNOSIS — L97818 Non-pressure chronic ulcer of other part of right lower leg with other specified severity: Secondary | ICD-10-CM | POA: Diagnosis not present

## 2022-10-26 DIAGNOSIS — Z7901 Long term (current) use of anticoagulants: Secondary | ICD-10-CM | POA: Diagnosis not present

## 2022-10-26 DIAGNOSIS — I890 Lymphedema, not elsewhere classified: Secondary | ICD-10-CM | POA: Diagnosis not present

## 2022-10-26 DIAGNOSIS — I509 Heart failure, unspecified: Secondary | ICD-10-CM | POA: Diagnosis not present

## 2022-10-26 DIAGNOSIS — I4891 Unspecified atrial fibrillation: Secondary | ICD-10-CM | POA: Diagnosis not present

## 2022-10-26 DIAGNOSIS — I87333 Chronic venous hypertension (idiopathic) with ulcer and inflammation of bilateral lower extremity: Secondary | ICD-10-CM | POA: Diagnosis not present

## 2022-10-26 DIAGNOSIS — E1151 Type 2 diabetes mellitus with diabetic peripheral angiopathy without gangrene: Secondary | ICD-10-CM | POA: Diagnosis not present

## 2022-10-26 NOTE — Progress Notes (Addendum)
J69.678 Chronic venous hypertension (idiopathic) with ulcer and inflammation of bilateral lower extremity L97.828 Non-pressure chronic ulcer of other part of left lower leg with other specified severity L97.818 Non-pressure chronic ulcer of other part of right lower leg with other specified severity E11.621 Type 2 diabetes mellitus with foot ulcer L97.518 Non-pressure chronic ulcer of other part of right foot with other specified severity Follow-up Appointments ppointment in 1 week. Leonard Schwartz Wednesday 10/26/2022 (already scheduled) Return A ppointment in 2 weeks. Leonard Schwartz Wednesday 11/02/2022 (already scheduled) Return A Return appointment in 3 weeks. Leonard Schwartz Wednesday [facility] office to schedule) Return appointment in 1 month. Leonard Schwartz Wednesday [facility] office to schedule) Anesthetic (In clinic)  Topical Lidocaine 5% applied to wound bed - Used in Clinic Prior to debridement (In clinic) Topical Lidocaine 4% applied to wound bed Cellular or Tissue Based Products Cellular or Tissue Based Product Type: - Run IVR for Kerrecis- denied #1 Apligraf applied 02/23/2022 #2 Apligraf applied 03/02/2022 #3 Apligraf applied 03/16/2022 #4 Apligraf applied 03/30/2022 HOLD APLIGRAF this week. Cellular or Tissue Based Product applied to wound bed, secured with steri-strips, cover with Adaptic or Mepitel. (DO NOT REMOVE). Bathing/ Shower/ Hygiene May shower with protection but do not get wound dressing(s) wet. Protect dressing(s) with water repellant cover (for example, large plastic bag) or a cast cover and may then take shower. Edema Control - Lymphedema / SCD / Other Elevate legs to the level of the heart or above for 30 minutes daily and/or when sitting for 3-4 times a day throughout the day. Avoid standing for long periods of time. Patient to wear own compression stockings every day. - apply in the morning and remove at night left leg. Exercise regularly Moisturize legs daily. - apply every night before bed. Compression stocking or Garment 30-40 mm/Hg pressure to: - wear your VIVE compression stocking to left leg. apply in the morning and remove at night. Wound Treatment Wound #7 - Malleolus Wound Laterality: Right, Medial Cleanser: Soap and Water 1 x Per Week/30 Days Discharge Instructions: May shower and wash wound with dial antibacterial soap and water prior to dressing change. Cleanser: Vashe 5.8 (oz) 1 x Per Week/30 Days DAYLN, YUST D ([id number]) [id number] Page 6 of 12 Discharge Instructions: Cleanse the wound with Vashe prior to applying a clean dressing using gauze sponges, not tissue or cotton balls. Peri-Wound Care: Sween Lotion (Moisturizing lotion) 1 x Per Week/30 Days Discharge Instructions: Apply moisturizing lotion as directed Topical: Gentamicin 1 x Per  Week/30 Days Discharge Instructions: As directed by physician Prim Dressing: Endoform 2x2 in (Dispense As Written) 1 x Per Week/30 Days ary Discharge Instructions: Moisten with saline Secondary Dressing: CarboFLEX Odor Control Dressing, 4x4 in 1 x Per Week/30 Days Discharge Instructions: Apply over primary dressing as directed. Secondary Dressing: Drawtex 4x4 in 1 x Per Week/30 Days Discharge Instructions: Apply over primary dressing as directed. Secondary Dressing: Zetuvit Plus 4x8 in 1 x Per Week/30 Days Discharge Instructions: Apply over primary dressing as directed. Compression Wrap: Urgo K2, (equivalent to a 4 layer) two layer compression system, regular 1 x Per Week/30 Days Discharge Instructions: Apply Urgo K2 as directed (alternative to 4 layer compression). Electronic Signature(s) Signed: 10/26/2022 4:26:00 PM By: Allen Derry PA-C Signed: 11/07/2022 4:05:37 PM By: Redmond Pulling RN, BSN Entered By: Redmond Pulling on 10/26/2022 15:39:03 -------------------------------------------------------------------------------- Problem List Details Patient Name: Date of Service: Caraher, [name] 10/26/2022 2:45 PM Medical Record Number: [id number] Patient Account Number: [id number] Date of Birth/Sex: Treating RN: 1955-07-31 (67  with where we stand today. Fortunately I do not see any signs of active infection which is great news. 09-14-2022 upon evaluation today patient appears to be doing well current regard to his wound with section showing signs of improvement would be using endoform which I think is really doing a great job here. Fortunately I do not see any signs of worsening overall. 09-21-2022 upon evaluation today patient appears to be doing well currently in regard to his wound. He has been tolerating the dressing changes without complication. Fortunately there does not appear to be any signs of active infection at this time which is good news. 09-28-2022 upon evaluation today patient appears to be doing well currently in regard to his wound which is actually measuring quite a bit smaller. This looks to be doing well and very pleased with where things stand today I do not see any signs of active infection locally or systemically which is great news. No  fevers, chills, nausea, vomiting, or diarrhea. 10-05-2022 upon evaluation today patient appears to be doing excellent in regard to his wounds. He has been tolerating the dressing changes without complication and in general I do believe that we will make an really good headway towards complete closure. 10/12/2022 upon evaluation today patient's wound is actually showing signs of excellent improvement. I am actually very pleased with where we stand I think that he is moving in the right direction and this is excellent news. I do not see any evidence of worsening overall 10-19-2022 upon evaluation today patient appears to be doing well currently in regard to his wound. He is showing signs of improvement and very pleased with where we stand I think that is making good progress here. 10-26-2022 upon evaluation today patient appears to be doing well currently in regard to his wound. In fact this is showing signs of excellent improvement I am actually very pleased with where we stand I do believe that he is making excellent headway towards complete closure which is great news as well. No fevers, chills, nausea, vomiting, or diarrhea. Electronic Signature(s) Signed: 10/26/2022 3:32:49 PM By: Allen Derry PA-C Entered By: Allen Derry on 10/26/2022 15:32:49 -------------------------------------------------------------------------------- Physical Exam Details Patient Name: Date of Service: Egnew, [name] 10/26/2022 2:45 PM Medical Record Number: [id number] Patient Account Number: [id number] Date of Birth/Sex: Treating RN: 06-05-1955 (67 y.o. M) Primary Care Provider: [name] Other Clinician: Referring Provider: Treating Provider/Extender: Alver Fisher in Treatment: 69 Constitutional Well-nourished and well-hydrated in no acute distress. Respiratory normal breathing without difficulty. Psychiatric EDELMIRO, NIEMI ([id number]) [id number] Page 5 of  12 this patient is able to make decisions and demonstrates good insight into disease process. Alert and Oriented x 3. pleasant and cooperative. Notes Upon inspection patient's wound bed showed signs of good granulation epithelization at this point. I do not see any evidence of worsening infection at this point and in general I do believe that the patient is making excellent headway towards complete closure. Electronic Signature(s) Signed: 10/26/2022 3:33:04 PM By: Allen Derry PA-C Entered By: Allen Derry on 10/26/2022 15:33:04 -------------------------------------------------------------------------------- Physician Orders Details Patient Name: Date of Service: Charrette, [name] 10/26/2022 2:45 PM Medical Record Number: [id number] Patient Account Number: [id number] Date of Birth/Sex: Treating RN: 1955-06-06 (67 y.o. Cline Cools Primary Care Provider: [name] Other Clinician: Referring Provider: Treating Provider/Extender: Alver Fisher in Treatment: 27 Verbal / Phone Orders: No Diagnosis Coding ICD-10 Coding Code Description I89.0 Lymphedema, not elsewhere classified  y.o. M) Primary Care Provider: [name] Other Clinician: Referring Provider: Treating Provider/Extender: Alver Fisher in Treatment: 64 Active Problems ICD-10 Encounter Code Description Active Date MDM Diagnosis I89.0 Lymphedema, not elsewhere classified 08/04/2021 No Yes I87.333 Chronic venous hypertension (idiopathic) with ulcer and inflammation of 08/04/2021 No Yes bilateral lower extremity L97.828 Non-pressure chronic ulcer of other part of left lower leg with other specified 08/04/2021 No Yes severity L97.818 Non-pressure chronic ulcer of other part of right lower leg with other specified 08/04/2021 No Yes severity E11.621 Type 2  diabetes mellitus with foot ulcer 08/04/2021 No Yes L97.518 Non-pressure chronic ulcer of other part of right foot with other specified 08/04/2021 No Yes Storr, Ramesses D ([id number]) [phone].pdf Page 7 of 12 severity Inactive Problems Resolved Problems Electronic Signature(s) Signed: 10/26/2022 2:42:20 PM By: Allen Derry PA-C Entered By: Allen Derry on 10/26/2022 14:42:20 -------------------------------------------------------------------------------- Progress Note Details Patient Name: Date of Service: Bolding, [name] 10/26/2022 2:45 PM Medical Record Number: [id number] Patient Account Number: [id number] Date of Birth/Sex: Treating RN: 07-14-1955 (67 y.o. M) Primary Care Provider: [name] Other Clinician: Referring Provider: Treating Provider/Extender: Alver Fisher in Treatment: 85 Subjective Chief Complaint Information obtained from Patient 08/04/2021; patient returns to clinic with bilateral leg wounds as well as areas on the right foot History of Present Illness (HPI) ADMISSION 03/22/2021 This is a 67 year old man with a past medical history significant for diabetes type 2, congestive heart failure, peripheral arterial disease, morbid obesity, venous insufficiency, and coronary artery disease. He has been followed by Dr. Loreta Ave in podiatry, who performed a transmetatarsal amputation on the left foot in August 2022. He had issues healing that wound, but based upon Dr. Kenna Gilbert notes, ultimately the TMA wound healed. During his recovery from that surgery, however, ulcers opened up over the DIP joint of the right second and third toe. These have apparently closed and reopened multiple times. It sounds like one of the issues has been moisture accumulation and maceration of the tissues causing them to reopen. At his last visit with Dr. Loreta Ave, on March 01, 2021, there continues to be problems with moisture and he was referred to wound  care for further evaluation and management. He had a formal aortogram with runoff performed prior to his TMA. The findings are copied here: Patient has inline flow to both feet with no significant flow-limiting lesion that would be amenable to percutaneous or open revascularization. He does have an element of small vessel disease and has a short segment occlusion of the distal anterior tibial/dorsalis pedis artery on the left foot but does have posterior tibial artery flow. Would recommend management of wounds with amputation of toes 2 and 3 on the right foot if the wounds do not heal and deteriorate. Transmetatarsal amputation on the left side has as good a blood supply as it is going to get and hopefully this will heal in the future. Formal ABIs were done in January 2023. They are normal bilaterally. ABI Findings: +---------+------------------+-----+----------+--------+ Right Rt Pressure (mmHg)IndexWaveform Comment  +---------+------------------+-----+----------+--------+ Brachial 160    +---------+------------------+-----+----------+--------+ PTA 192 1.06 monophasic  +---------+------------------+-----+----------+--------+ DP 159 0.88 monophasic  +---------+------------------+-----+----------+--------+ Great T oe145 0.80    +---------+------------------+-----+----------+--------+ +--------+------------------+-----+---------+-------+ Left Lt Pressure (mmHg)IndexWaveform Comment +--------+------------------+-----+---------+-------+ [id number]    +--------+------------------+-----+---------+-------+ PTA 204 1.13 triphasic  +--------+------------------+-----+---------+-------+ DP 194 1.07 biphasic   +--------+------------------+-----+---------+-------+ +-------+-----------+-----------+------------+------------+ ABI/TBIT oday's ABIT oday's TBIPrevious ABIPrevious  TBI +-------+-----------+-----------+------------+------------+ Right 1.06 0.8 1.26 0.65  +-------+-----------+-----------+------------+------------+ JASTON, BARTOLOMUCCI D ([id number]) [phone].pdf Page 8 of  J69.678 Chronic venous hypertension (idiopathic) with ulcer and inflammation of bilateral lower extremity L97.828 Non-pressure chronic ulcer of other part of left lower leg with other specified severity L97.818 Non-pressure chronic ulcer of other part of right lower leg with other specified severity E11.621 Type 2 diabetes mellitus with foot ulcer L97.518 Non-pressure chronic ulcer of other part of right foot with other specified severity Follow-up Appointments ppointment in 1 week. Leonard Schwartz Wednesday 10/26/2022 (already scheduled) Return A ppointment in 2 weeks. Leonard Schwartz Wednesday 11/02/2022 (already scheduled) Return A Return appointment in 3 weeks. Leonard Schwartz Wednesday [facility] office to schedule) Return appointment in 1 month. Leonard Schwartz Wednesday [facility] office to schedule) Anesthetic (In clinic)  Topical Lidocaine 5% applied to wound bed - Used in Clinic Prior to debridement (In clinic) Topical Lidocaine 4% applied to wound bed Cellular or Tissue Based Products Cellular or Tissue Based Product Type: - Run IVR for Kerrecis- denied #1 Apligraf applied 02/23/2022 #2 Apligraf applied 03/02/2022 #3 Apligraf applied 03/16/2022 #4 Apligraf applied 03/30/2022 HOLD APLIGRAF this week. Cellular or Tissue Based Product applied to wound bed, secured with steri-strips, cover with Adaptic or Mepitel. (DO NOT REMOVE). Bathing/ Shower/ Hygiene May shower with protection but do not get wound dressing(s) wet. Protect dressing(s) with water repellant cover (for example, large plastic bag) or a cast cover and may then take shower. Edema Control - Lymphedema / SCD / Other Elevate legs to the level of the heart or above for 30 minutes daily and/or when sitting for 3-4 times a day throughout the day. Avoid standing for long periods of time. Patient to wear own compression stockings every day. - apply in the morning and remove at night left leg. Exercise regularly Moisturize legs daily. - apply every night before bed. Compression stocking or Garment 30-40 mm/Hg pressure to: - wear your VIVE compression stocking to left leg. apply in the morning and remove at night. Wound Treatment Wound #7 - Malleolus Wound Laterality: Right, Medial Cleanser: Soap and Water 1 x Per Week/30 Days Discharge Instructions: May shower and wash wound with dial antibacterial soap and water prior to dressing change. Cleanser: Vashe 5.8 (oz) 1 x Per Week/30 Days DAYLN, YUST D ([id number]) [id number] Page 6 of 12 Discharge Instructions: Cleanse the wound with Vashe prior to applying a clean dressing using gauze sponges, not tissue or cotton balls. Peri-Wound Care: Sween Lotion (Moisturizing lotion) 1 x Per Week/30 Days Discharge Instructions: Apply moisturizing lotion as directed Topical: Gentamicin 1 x Per  Week/30 Days Discharge Instructions: As directed by physician Prim Dressing: Endoform 2x2 in (Dispense As Written) 1 x Per Week/30 Days ary Discharge Instructions: Moisten with saline Secondary Dressing: CarboFLEX Odor Control Dressing, 4x4 in 1 x Per Week/30 Days Discharge Instructions: Apply over primary dressing as directed. Secondary Dressing: Drawtex 4x4 in 1 x Per Week/30 Days Discharge Instructions: Apply over primary dressing as directed. Secondary Dressing: Zetuvit Plus 4x8 in 1 x Per Week/30 Days Discharge Instructions: Apply over primary dressing as directed. Compression Wrap: Urgo K2, (equivalent to a 4 layer) two layer compression system, regular 1 x Per Week/30 Days Discharge Instructions: Apply Urgo K2 as directed (alternative to 4 layer compression). Electronic Signature(s) Signed: 10/26/2022 4:26:00 PM By: Allen Derry PA-C Signed: 11/07/2022 4:05:37 PM By: Redmond Pulling RN, BSN Entered By: Redmond Pulling on 10/26/2022 15:39:03 -------------------------------------------------------------------------------- Problem List Details Patient Name: Date of Service: Caraher, [name] 10/26/2022 2:45 PM Medical Record Number: [id number] Patient Account Number: [id number] Date of Birth/Sex: Treating RN: 1955-07-31 (67  J69.678 Chronic venous hypertension (idiopathic) with ulcer and inflammation of bilateral lower extremity L97.828 Non-pressure chronic ulcer of other part of left lower leg with other specified severity L97.818 Non-pressure chronic ulcer of other part of right lower leg with other specified severity E11.621 Type 2 diabetes mellitus with foot ulcer L97.518 Non-pressure chronic ulcer of other part of right foot with other specified severity Follow-up Appointments ppointment in 1 week. Leonard Schwartz Wednesday 10/26/2022 (already scheduled) Return A ppointment in 2 weeks. Leonard Schwartz Wednesday 11/02/2022 (already scheduled) Return A Return appointment in 3 weeks. Leonard Schwartz Wednesday [facility] office to schedule) Return appointment in 1 month. Leonard Schwartz Wednesday [facility] office to schedule) Anesthetic (In clinic)  Topical Lidocaine 5% applied to wound bed - Used in Clinic Prior to debridement (In clinic) Topical Lidocaine 4% applied to wound bed Cellular or Tissue Based Products Cellular or Tissue Based Product Type: - Run IVR for Kerrecis- denied #1 Apligraf applied 02/23/2022 #2 Apligraf applied 03/02/2022 #3 Apligraf applied 03/16/2022 #4 Apligraf applied 03/30/2022 HOLD APLIGRAF this week. Cellular or Tissue Based Product applied to wound bed, secured with steri-strips, cover with Adaptic or Mepitel. (DO NOT REMOVE). Bathing/ Shower/ Hygiene May shower with protection but do not get wound dressing(s) wet. Protect dressing(s) with water repellant cover (for example, large plastic bag) or a cast cover and may then take shower. Edema Control - Lymphedema / SCD / Other Elevate legs to the level of the heart or above for 30 minutes daily and/or when sitting for 3-4 times a day throughout the day. Avoid standing for long periods of time. Patient to wear own compression stockings every day. - apply in the morning and remove at night left leg. Exercise regularly Moisturize legs daily. - apply every night before bed. Compression stocking or Garment 30-40 mm/Hg pressure to: - wear your VIVE compression stocking to left leg. apply in the morning and remove at night. Wound Treatment Wound #7 - Malleolus Wound Laterality: Right, Medial Cleanser: Soap and Water 1 x Per Week/30 Days Discharge Instructions: May shower and wash wound with dial antibacterial soap and water prior to dressing change. Cleanser: Vashe 5.8 (oz) 1 x Per Week/30 Days DAYLN, YUST D ([id number]) [id number] Page 6 of 12 Discharge Instructions: Cleanse the wound with Vashe prior to applying a clean dressing using gauze sponges, not tissue or cotton balls. Peri-Wound Care: Sween Lotion (Moisturizing lotion) 1 x Per Week/30 Days Discharge Instructions: Apply moisturizing lotion as directed Topical: Gentamicin 1 x Per  Week/30 Days Discharge Instructions: As directed by physician Prim Dressing: Endoform 2x2 in (Dispense As Written) 1 x Per Week/30 Days ary Discharge Instructions: Moisten with saline Secondary Dressing: CarboFLEX Odor Control Dressing, 4x4 in 1 x Per Week/30 Days Discharge Instructions: Apply over primary dressing as directed. Secondary Dressing: Drawtex 4x4 in 1 x Per Week/30 Days Discharge Instructions: Apply over primary dressing as directed. Secondary Dressing: Zetuvit Plus 4x8 in 1 x Per Week/30 Days Discharge Instructions: Apply over primary dressing as directed. Compression Wrap: Urgo K2, (equivalent to a 4 layer) two layer compression system, regular 1 x Per Week/30 Days Discharge Instructions: Apply Urgo K2 as directed (alternative to 4 layer compression). Electronic Signature(s) Signed: 10/26/2022 4:26:00 PM By: Allen Derry PA-C Signed: 11/07/2022 4:05:37 PM By: Redmond Pulling RN, BSN Entered By: Redmond Pulling on 10/26/2022 15:39:03 -------------------------------------------------------------------------------- Problem List Details Patient Name: Date of Service: Caraher, [name] 10/26/2022 2:45 PM Medical Record Number: [id number] Patient Account Number: [id number] Date of Birth/Sex: Treating RN: 1955-07-31 (67  J69.678 Chronic venous hypertension (idiopathic) with ulcer and inflammation of bilateral lower extremity L97.828 Non-pressure chronic ulcer of other part of left lower leg with other specified severity L97.818 Non-pressure chronic ulcer of other part of right lower leg with other specified severity E11.621 Type 2 diabetes mellitus with foot ulcer L97.518 Non-pressure chronic ulcer of other part of right foot with other specified severity Follow-up Appointments ppointment in 1 week. Leonard Schwartz Wednesday 10/26/2022 (already scheduled) Return A ppointment in 2 weeks. Leonard Schwartz Wednesday 11/02/2022 (already scheduled) Return A Return appointment in 3 weeks. Leonard Schwartz Wednesday [facility] office to schedule) Return appointment in 1 month. Leonard Schwartz Wednesday [facility] office to schedule) Anesthetic (In clinic)  Topical Lidocaine 5% applied to wound bed - Used in Clinic Prior to debridement (In clinic) Topical Lidocaine 4% applied to wound bed Cellular or Tissue Based Products Cellular or Tissue Based Product Type: - Run IVR for Kerrecis- denied #1 Apligraf applied 02/23/2022 #2 Apligraf applied 03/02/2022 #3 Apligraf applied 03/16/2022 #4 Apligraf applied 03/30/2022 HOLD APLIGRAF this week. Cellular or Tissue Based Product applied to wound bed, secured with steri-strips, cover with Adaptic or Mepitel. (DO NOT REMOVE). Bathing/ Shower/ Hygiene May shower with protection but do not get wound dressing(s) wet. Protect dressing(s) with water repellant cover (for example, large plastic bag) or a cast cover and may then take shower. Edema Control - Lymphedema / SCD / Other Elevate legs to the level of the heart or above for 30 minutes daily and/or when sitting for 3-4 times a day throughout the day. Avoid standing for long periods of time. Patient to wear own compression stockings every day. - apply in the morning and remove at night left leg. Exercise regularly Moisturize legs daily. - apply every night before bed. Compression stocking or Garment 30-40 mm/Hg pressure to: - wear your VIVE compression stocking to left leg. apply in the morning and remove at night. Wound Treatment Wound #7 - Malleolus Wound Laterality: Right, Medial Cleanser: Soap and Water 1 x Per Week/30 Days Discharge Instructions: May shower and wash wound with dial antibacterial soap and water prior to dressing change. Cleanser: Vashe 5.8 (oz) 1 x Per Week/30 Days DAYLN, YUST D ([id number]) [id number] Page 6 of 12 Discharge Instructions: Cleanse the wound with Vashe prior to applying a clean dressing using gauze sponges, not tissue or cotton balls. Peri-Wound Care: Sween Lotion (Moisturizing lotion) 1 x Per Week/30 Days Discharge Instructions: Apply moisturizing lotion as directed Topical: Gentamicin 1 x Per  Week/30 Days Discharge Instructions: As directed by physician Prim Dressing: Endoform 2x2 in (Dispense As Written) 1 x Per Week/30 Days ary Discharge Instructions: Moisten with saline Secondary Dressing: CarboFLEX Odor Control Dressing, 4x4 in 1 x Per Week/30 Days Discharge Instructions: Apply over primary dressing as directed. Secondary Dressing: Drawtex 4x4 in 1 x Per Week/30 Days Discharge Instructions: Apply over primary dressing as directed. Secondary Dressing: Zetuvit Plus 4x8 in 1 x Per Week/30 Days Discharge Instructions: Apply over primary dressing as directed. Compression Wrap: Urgo K2, (equivalent to a 4 layer) two layer compression system, regular 1 x Per Week/30 Days Discharge Instructions: Apply Urgo K2 as directed (alternative to 4 layer compression). Electronic Signature(s) Signed: 10/26/2022 4:26:00 PM By: Allen Derry PA-C Signed: 11/07/2022 4:05:37 PM By: Redmond Pulling RN, BSN Entered By: Redmond Pulling on 10/26/2022 15:39:03 -------------------------------------------------------------------------------- Problem List Details Patient Name: Date of Service: Caraher, [name] 10/26/2022 2:45 PM Medical Record Number: [id number] Patient Account Number: [id number] Date of Birth/Sex: Treating RN: 1955-07-31 (67  with where we stand today. Fortunately I do not see any signs of active infection which is great news. 09-14-2022 upon evaluation today patient appears to be doing well current regard to his wound with section showing signs of improvement would be using endoform which I think is really doing a great job here. Fortunately I do not see any signs of worsening overall. 09-21-2022 upon evaluation today patient appears to be doing well currently in regard to his wound. He has been tolerating the dressing changes without complication. Fortunately there does not appear to be any signs of active infection at this time which is good news. 09-28-2022 upon evaluation today patient appears to be doing well currently in regard to his wound which is actually measuring quite a bit smaller. This looks to be doing well and very pleased with where things stand today I do not see any signs of active infection locally or systemically which is great news. No  fevers, chills, nausea, vomiting, or diarrhea. 10-05-2022 upon evaluation today patient appears to be doing excellent in regard to his wounds. He has been tolerating the dressing changes without complication and in general I do believe that we will make an really good headway towards complete closure. 10/12/2022 upon evaluation today patient's wound is actually showing signs of excellent improvement. I am actually very pleased with where we stand I think that he is moving in the right direction and this is excellent news. I do not see any evidence of worsening overall 10-19-2022 upon evaluation today patient appears to be doing well currently in regard to his wound. He is showing signs of improvement and very pleased with where we stand I think that is making good progress here. 10-26-2022 upon evaluation today patient appears to be doing well currently in regard to his wound. In fact this is showing signs of excellent improvement I am actually very pleased with where we stand I do believe that he is making excellent headway towards complete closure which is great news as well. No fevers, chills, nausea, vomiting, or diarrhea. Electronic Signature(s) Signed: 10/26/2022 3:32:49 PM By: Allen Derry PA-C Entered By: Allen Derry on 10/26/2022 15:32:49 -------------------------------------------------------------------------------- Physical Exam Details Patient Name: Date of Service: Egnew, [name] 10/26/2022 2:45 PM Medical Record Number: [id number] Patient Account Number: [id number] Date of Birth/Sex: Treating RN: 06-05-1955 (67 y.o. M) Primary Care Provider: [name] Other Clinician: Referring Provider: Treating Provider/Extender: Alver Fisher in Treatment: 69 Constitutional Well-nourished and well-hydrated in no acute distress. Respiratory normal breathing without difficulty. Psychiatric EDELMIRO, NIEMI ([id number]) [id number] Page 5 of  12 this patient is able to make decisions and demonstrates good insight into disease process. Alert and Oriented x 3. pleasant and cooperative. Notes Upon inspection patient's wound bed showed signs of good granulation epithelization at this point. I do not see any evidence of worsening infection at this point and in general I do believe that the patient is making excellent headway towards complete closure. Electronic Signature(s) Signed: 10/26/2022 3:33:04 PM By: Allen Derry PA-C Entered By: Allen Derry on 10/26/2022 15:33:04 -------------------------------------------------------------------------------- Physician Orders Details Patient Name: Date of Service: Charrette, [name] 10/26/2022 2:45 PM Medical Record Number: [id number] Patient Account Number: [id number] Date of Birth/Sex: Treating RN: 1955-06-06 (67 y.o. Cline Cools Primary Care Provider: [name] Other Clinician: Referring Provider: Treating Provider/Extender: Alver Fisher in Treatment: 27 Verbal / Phone Orders: No Diagnosis Coding ICD-10 Coding Code Description I89.0 Lymphedema, not elsewhere classified  layer) two layer compression system, regular 1 x Per Week/30 Days Discharge Instructions: Apply Urgo K2 as directed (alternative to 4 layer compression). 1. I am going to recommend that we have the patient continue to monitor for any signs of infection or worsening. Based on what I am seeing I do believe that the patient is making excellent headway towards closure I am very pleased with where we stand. 2. I am also can recommend patient should continue with the endoform followed by the drawtex with the gentamicin and then Coverflex followed by the Urgo K2 compression wrap which is doing an excellent job his wound is significantly smaller this week and every week that have been seen on this is getting better and better. We will see patient back for reevaluation in 1 week here in the clinic. If anything worsens or changes patient will contact our office for additional recommendations. Electronic Signature(s) Signed: 10/26/2022 3:33:42 PM By: Allen Derry PA-C Entered By: Allen Derry on 10/26/2022 15:33:41 -------------------------------------------------------------------------------- SuperBill Details Patient Name: Date of Service: Hosmer, [name] 10/26/2022 Medical Record Number: [id number] Patient Account Number: [id number] Date of Birth/Sex: Treating RN: [birth year] (67 y.o. M) Primary Care Provider: [name] Other Clinician: Referring Provider: Treating Provider/Extender: Alver Fisher in Treatment: [address] [place] D ([id number]) [id number] Page 12 of 12 Diagnosis Coding ICD-10 Codes Code Description I89.0 Lymphedema, not  elsewhere classified I87.333 Chronic venous hypertension (idiopathic) with ulcer and inflammation of bilateral lower extremity L97.828 Non-pressure chronic ulcer of other part of left lower leg with other specified severity L97.818 Non-pressure chronic ulcer of other part of right lower leg with other specified severity E11.621 Type 2 diabetes mellitus with foot ulcer L97.518 Non-pressure chronic ulcer of other part of right foot with other specified severity Facility Procedures : 3 CPT4 Code: 0102725 Description: (Facility Use Only) 36644IH - APPLY MULTLAY COMPRS LWR RT LEG Modifier: Quantity: 1 Physician Procedures : CPT4 Code Description Modifier [id number] - WC PHYS LEVEL 3 - EST PT ICD-10 Diagnosis Description I89.0 Lymphedema, not elsewhere classified I87.333 Chronic venous hypertension (idiopathic) with ulcer and inflammation of bilateral lower extremity  L97.828 Non-pressure chronic ulcer of other part of left lower leg with other specified severity L97.818 Non-pressure chronic ulcer of other part of right lower leg with other specified severity Quantity: 1 Electronic Signature(s) Signed: 10/26/2022 4:26:00 PM By: Allen Derry PA-C Signed: 11/07/2022 4:05:37 PM By: Redmond Pulling RN, BSN Previous Signature: 10/26/2022 3:33:54 PM Version By: Allen Derry PA-C Entered By: Redmond Pulling on 10/26/2022 15:40:11  J69.678 Chronic venous hypertension (idiopathic) with ulcer and inflammation of bilateral lower extremity L97.828 Non-pressure chronic ulcer of other part of left lower leg with other specified severity L97.818 Non-pressure chronic ulcer of other part of right lower leg with other specified severity E11.621 Type 2 diabetes mellitus with foot ulcer L97.518 Non-pressure chronic ulcer of other part of right foot with other specified severity Follow-up Appointments ppointment in 1 week. Leonard Schwartz Wednesday 10/26/2022 (already scheduled) Return A ppointment in 2 weeks. Leonard Schwartz Wednesday 11/02/2022 (already scheduled) Return A Return appointment in 3 weeks. Leonard Schwartz Wednesday [facility] office to schedule) Return appointment in 1 month. Leonard Schwartz Wednesday [facility] office to schedule) Anesthetic (In clinic)  Topical Lidocaine 5% applied to wound bed - Used in Clinic Prior to debridement (In clinic) Topical Lidocaine 4% applied to wound bed Cellular or Tissue Based Products Cellular or Tissue Based Product Type: - Run IVR for Kerrecis- denied #1 Apligraf applied 02/23/2022 #2 Apligraf applied 03/02/2022 #3 Apligraf applied 03/16/2022 #4 Apligraf applied 03/30/2022 HOLD APLIGRAF this week. Cellular or Tissue Based Product applied to wound bed, secured with steri-strips, cover with Adaptic or Mepitel. (DO NOT REMOVE). Bathing/ Shower/ Hygiene May shower with protection but do not get wound dressing(s) wet. Protect dressing(s) with water repellant cover (for example, large plastic bag) or a cast cover and may then take shower. Edema Control - Lymphedema / SCD / Other Elevate legs to the level of the heart or above for 30 minutes daily and/or when sitting for 3-4 times a day throughout the day. Avoid standing for long periods of time. Patient to wear own compression stockings every day. - apply in the morning and remove at night left leg. Exercise regularly Moisturize legs daily. - apply every night before bed. Compression stocking or Garment 30-40 mm/Hg pressure to: - wear your VIVE compression stocking to left leg. apply in the morning and remove at night. Wound Treatment Wound #7 - Malleolus Wound Laterality: Right, Medial Cleanser: Soap and Water 1 x Per Week/30 Days Discharge Instructions: May shower and wash wound with dial antibacterial soap and water prior to dressing change. Cleanser: Vashe 5.8 (oz) 1 x Per Week/30 Days DAYLN, YUST D ([id number]) [id number] Page 6 of 12 Discharge Instructions: Cleanse the wound with Vashe prior to applying a clean dressing using gauze sponges, not tissue or cotton balls. Peri-Wound Care: Sween Lotion (Moisturizing lotion) 1 x Per Week/30 Days Discharge Instructions: Apply moisturizing lotion as directed Topical: Gentamicin 1 x Per  Week/30 Days Discharge Instructions: As directed by physician Prim Dressing: Endoform 2x2 in (Dispense As Written) 1 x Per Week/30 Days ary Discharge Instructions: Moisten with saline Secondary Dressing: CarboFLEX Odor Control Dressing, 4x4 in 1 x Per Week/30 Days Discharge Instructions: Apply over primary dressing as directed. Secondary Dressing: Drawtex 4x4 in 1 x Per Week/30 Days Discharge Instructions: Apply over primary dressing as directed. Secondary Dressing: Zetuvit Plus 4x8 in 1 x Per Week/30 Days Discharge Instructions: Apply over primary dressing as directed. Compression Wrap: Urgo K2, (equivalent to a 4 layer) two layer compression system, regular 1 x Per Week/30 Days Discharge Instructions: Apply Urgo K2 as directed (alternative to 4 layer compression). Electronic Signature(s) Signed: 10/26/2022 4:26:00 PM By: Allen Derry PA-C Signed: 11/07/2022 4:05:37 PM By: Redmond Pulling RN, BSN Entered By: Redmond Pulling on 10/26/2022 15:39:03 -------------------------------------------------------------------------------- Problem List Details Patient Name: Date of Service: Caraher, [name] 10/26/2022 2:45 PM Medical Record Number: [id number] Patient Account Number: [id number] Date of Birth/Sex: Treating RN: 1955-07-31 (67  with where we stand today. Fortunately I do not see any signs of active infection which is great news. 09-14-2022 upon evaluation today patient appears to be doing well current regard to his wound with section showing signs of improvement would be using endoform which I think is really doing a great job here. Fortunately I do not see any signs of worsening overall. 09-21-2022 upon evaluation today patient appears to be doing well currently in regard to his wound. He has been tolerating the dressing changes without complication. Fortunately there does not appear to be any signs of active infection at this time which is good news. 09-28-2022 upon evaluation today patient appears to be doing well currently in regard to his wound which is actually measuring quite a bit smaller. This looks to be doing well and very pleased with where things stand today I do not see any signs of active infection locally or systemically which is great news. No  fevers, chills, nausea, vomiting, or diarrhea. 10-05-2022 upon evaluation today patient appears to be doing excellent in regard to his wounds. He has been tolerating the dressing changes without complication and in general I do believe that we will make an really good headway towards complete closure. 10/12/2022 upon evaluation today patient's wound is actually showing signs of excellent improvement. I am actually very pleased with where we stand I think that he is moving in the right direction and this is excellent news. I do not see any evidence of worsening overall 10-19-2022 upon evaluation today patient appears to be doing well currently in regard to his wound. He is showing signs of improvement and very pleased with where we stand I think that is making good progress here. 10-26-2022 upon evaluation today patient appears to be doing well currently in regard to his wound. In fact this is showing signs of excellent improvement I am actually very pleased with where we stand I do believe that he is making excellent headway towards complete closure which is great news as well. No fevers, chills, nausea, vomiting, or diarrhea. Electronic Signature(s) Signed: 10/26/2022 3:32:49 PM By: Allen Derry PA-C Entered By: Allen Derry on 10/26/2022 15:32:49 -------------------------------------------------------------------------------- Physical Exam Details Patient Name: Date of Service: Egnew, [name] 10/26/2022 2:45 PM Medical Record Number: [id number] Patient Account Number: [id number] Date of Birth/Sex: Treating RN: 06-05-1955 (67 y.o. M) Primary Care Provider: [name] Other Clinician: Referring Provider: Treating Provider/Extender: Alver Fisher in Treatment: 69 Constitutional Well-nourished and well-hydrated in no acute distress. Respiratory normal breathing without difficulty. Psychiatric EDELMIRO, NIEMI ([id number]) [id number] Page 5 of  12 this patient is able to make decisions and demonstrates good insight into disease process. Alert and Oriented x 3. pleasant and cooperative. Notes Upon inspection patient's wound bed showed signs of good granulation epithelization at this point. I do not see any evidence of worsening infection at this point and in general I do believe that the patient is making excellent headway towards complete closure. Electronic Signature(s) Signed: 10/26/2022 3:33:04 PM By: Allen Derry PA-C Entered By: Allen Derry on 10/26/2022 15:33:04 -------------------------------------------------------------------------------- Physician Orders Details Patient Name: Date of Service: Charrette, [name] 10/26/2022 2:45 PM Medical Record Number: [id number] Patient Account Number: [id number] Date of Birth/Sex: Treating RN: 1955-06-06 (67 y.o. Cline Cools Primary Care Provider: [name] Other Clinician: Referring Provider: Treating Provider/Extender: Alver Fisher in Treatment: 27 Verbal / Phone Orders: No Diagnosis Coding ICD-10 Coding Code Description I89.0 Lymphedema, not elsewhere classified  WAKE, ACRE ([id number]) [id number] Page 1 of 12 Visit Report for 10/26/2022 Chief Complaint Document Details Patient Name: Date of Service: SAFARI, [place] 10/26/2022 2:45 PM Medical Record Number: [id number] Patient Account Number: [id number] Date of Birth/Sex: Treating RN: Jan 11, 1956 (67 y.o. M) Primary Care Provider: [name] Other Clinician: Referring Provider: Treating Provider/Extender: Alver Fisher in Treatment: 78 Information Obtained from: Patient Chief Complaint 08/04/2021; patient returns to clinic with bilateral leg wounds as well as areas on the right foot Electronic Signature(s) Signed: 10/26/2022 2:42:28 PM By: Allen Derry PA-C Entered By: Allen Derry on 10/26/2022 14:42:28 -------------------------------------------------------------------------------- HPI Details Patient Name: Date of Service: Pore, [name] 10/26/2022 2:45 PM Medical Record Number: [id number] Patient Account Number: [id number] Date of Birth/Sex: Treating RN: 10/11/1955 (67 y.o. M) Primary Care Provider: [name] Other Clinician: Referring Provider: Treating Provider/Extender: Alver Fisher in Treatment: 78 History of Present Illness HPI Description: ADMISSION 03/22/2021 This is a 67 year old man with a past medical history significant for diabetes type 2, congestive heart failure, peripheral arterial disease, morbid obesity, venous insufficiency, and coronary artery disease. He has been followed by Dr. Loreta Ave in podiatry, who performed a transmetatarsal amputation on the left foot in August 2022. He had issues healing that wound, but based upon Dr. Kenna Gilbert notes, ultimately the TMA wound healed. During his recovery from that surgery, however, ulcers opened up over the DIP joint of the right second and third toe. These have apparently closed and reopened multiple times. It sounds like one of the issues has been  moisture accumulation and maceration of the tissues causing them to reopen. At his last visit with Dr. Loreta Ave, on March 01, 2021, there continues to be problems with moisture and he was referred to wound care for further evaluation and management. He had a formal aortogram with runoff performed prior to his TMA. The findings are copied here: Patient has inline flow to both feet with no significant flow-limiting lesion that would be amenable to percutaneous or open revascularization. He does have an element of small vessel disease and has a short segment occlusion of the distal anterior tibial/dorsalis pedis artery on the left foot but does have posterior tibial artery flow. Would recommend management of wounds with amputation of toes 2 and 3 on the right foot if the wounds do not heal and deteriorate. Transmetatarsal amputation on the left side has as good a blood supply as it is going to get and hopefully this will heal in the future. Formal ABIs were done in January 2023. They are normal bilaterally. ABI Findings: +---------+------------------+-----+----------+--------+ Right Rt Pressure (mmHg)IndexWaveform Comment  +---------+------------------+-----+----------+--------+ Brachial 160     +---------+------------------+-----+----------+--------+ PTA 192 1.06 monophasic  +---------+------------------+-----+----------+--------+ DP 159 0.88 monophasic  +---------+------------------+-----+----------+--------+ Great T oe145 0.80    +---------+------------------+-----+----------+--------+ +--------+------------------+-----+---------+-------+ Rozetta Nunnery D ([id number]) [id number] Page 2 of 12 Left Lt Pressure (mmHg)IndexWaveform Comment +--------+------------------+-----+---------+-------+ [id number]     +--------+------------------+-----+---------+-------+ PTA 204 1.13 triphasic   +--------+------------------+-----+---------+-------+ DP 194 1.07 biphasic   +--------+------------------+-----+---------+-------+ +-------+-----------+-----------+------------+------------+ ABI/TBIT oday's ABIT oday's TBIPrevious ABIPrevious TBI +-------+-----------+-----------+------------+------------+ Right 1.06 0.8 1.26 0.65  +-------+-----------+-----------+------------+------------+ Left 1.13 amputation 1.15 amputation  +-------+-----------+-----------+------------+------------+ Previous ABI on 08/06/20 at [facility] Pedal pressures falsely elevated due to medial calcification. Summary: Right: Resting right ankle-brachial index is within normal range. The right toe-brachial index is normal. Left: Resting left ankle-brachial index is within normal range. READMISSION 08/04/2021 Mr. Simo is now a 67 year old man who I remember from this clinic many years ago I  layer) two layer compression system, regular 1 x Per Week/30 Days Discharge Instructions: Apply Urgo K2 as directed (alternative to 4 layer compression). 1. I am going to recommend that we have the patient continue to monitor for any signs of infection or worsening. Based on what I am seeing I do believe that the patient is making excellent headway towards closure I am very pleased with where we stand. 2. I am also can recommend patient should continue with the endoform followed by the drawtex with the gentamicin and then Coverflex followed by the Urgo K2 compression wrap which is doing an excellent job his wound is significantly smaller this week and every week that have been seen on this is getting better and better. We will see patient back for reevaluation in 1 week here in the clinic. If anything worsens or changes patient will contact our office for additional recommendations. Electronic Signature(s) Signed: 10/26/2022 3:33:42 PM By: Allen Derry PA-C Entered By: Allen Derry on 10/26/2022 15:33:41 -------------------------------------------------------------------------------- SuperBill Details Patient Name: Date of Service: Hosmer, [name] 10/26/2022 Medical Record Number: [id number] Patient Account Number: [id number] Date of Birth/Sex: Treating RN: [birth year] (67 y.o. M) Primary Care Provider: [name] Other Clinician: Referring Provider: Treating Provider/Extender: Alver Fisher in Treatment: [address] [place] ([id number]) [id number] Page 12 of 12 Diagnosis Coding ICD-10 Codes Code Description I89.0 Lymphedema, not  elsewhere classified I87.333 Chronic venous hypertension (idiopathic) with ulcer and inflammation of bilateral lower extremity L97.828 Non-pressure chronic ulcer of other part of left lower leg with other specified severity L97.818 Non-pressure chronic ulcer of other part of right lower leg with other specified severity E11.621 Type 2 diabetes mellitus with foot ulcer L97.518 Non-pressure chronic ulcer of other part of right foot with other specified severity Facility Procedures : 3 CPT4 Code: 0102725 Description: (Facility Use Only) 36644IH - APPLY MULTLAY COMPRS LWR RT LEG Modifier: Quantity: 1 Physician Procedures : CPT4 Code Description Modifier [id number] - WC PHYS LEVEL 3 - EST PT ICD-10 Diagnosis Description I89.0 Lymphedema, not elsewhere classified I87.333 Chronic venous hypertension (idiopathic) with ulcer and inflammation of bilateral lower extremity  L97.828 Non-pressure chronic ulcer of other part of left lower leg with other specified severity L97.818 Non-pressure chronic ulcer of other part of right lower leg with other specified severity Quantity: 1 Electronic Signature(s) Signed: 10/26/2022 4:26:00 PM By: Allen Derry PA-C Signed: 11/07/2022 4:05:37 PM By: Redmond Pulling RN, BSN Previous Signature: 10/26/2022 3:33:54 PM Version By: Allen Derry PA-C Entered By: Redmond Pulling on 10/26/2022 15:40:11  with where we stand today. Fortunately I do not see any signs of active infection which is great news. 09-14-2022 upon evaluation today patient appears to be doing well current regard to his wound with section showing signs of improvement would be using endoform which I think is really doing a great job here. Fortunately I do not see any signs of worsening overall. 09-21-2022 upon evaluation today patient appears to be doing well currently in regard to his wound. He has been tolerating the dressing changes without complication. Fortunately there does not appear to be any signs of active infection at this time which is good news. 09-28-2022 upon evaluation today patient appears to be doing well currently in regard to his wound which is actually measuring quite a bit smaller. This looks to be doing well and very pleased with where things stand today I do not see any signs of active infection locally or systemically which is great news. No  fevers, chills, nausea, vomiting, or diarrhea. 10-05-2022 upon evaluation today patient appears to be doing excellent in regard to his wounds. He has been tolerating the dressing changes without complication and in general I do believe that we will make an really good headway towards complete closure. 10/12/2022 upon evaluation today patient's wound is actually showing signs of excellent improvement. I am actually very pleased with where we stand I think that he is moving in the right direction and this is excellent news. I do not see any evidence of worsening overall 10-19-2022 upon evaluation today patient appears to be doing well currently in regard to his wound. He is showing signs of improvement and very pleased with where we stand I think that is making good progress here. 10-26-2022 upon evaluation today patient appears to be doing well currently in regard to his wound. In fact this is showing signs of excellent improvement I am actually very pleased with where we stand I do believe that he is making excellent headway towards complete closure which is great news as well. No fevers, chills, nausea, vomiting, or diarrhea. Electronic Signature(s) Signed: 10/26/2022 3:32:49 PM By: Allen Derry PA-C Entered By: Allen Derry on 10/26/2022 15:32:49 -------------------------------------------------------------------------------- Physical Exam Details Patient Name: Date of Service: Egnew, [name] 10/26/2022 2:45 PM Medical Record Number: [id number] Patient Account Number: [id number] Date of Birth/Sex: Treating RN: 06-05-1955 (67 y.o. M) Primary Care Provider: [name] Other Clinician: Referring Provider: Treating Provider/Extender: Alver Fisher in Treatment: 69 Constitutional Well-nourished and well-hydrated in no acute distress. Respiratory normal breathing without difficulty. Psychiatric EDELMIRO, NIEMI ([id number]) [id number].pdf Page 5 of  12 this patient is able to make decisions and demonstrates good insight into disease process. Alert and Oriented x 3. pleasant and cooperative. Notes Upon inspection patient's wound bed showed signs of good granulation epithelization at this point. I do not see any evidence of worsening infection at this point and in general I do believe that the patient is making excellent headway towards complete closure. Electronic Signature(s) Signed: 10/26/2022 3:33:04 PM By: Allen Derry PA-C Entered By: Allen Derry on 10/26/2022 15:33:04 -------------------------------------------------------------------------------- Physician Orders Details Patient Name: Date of Service: Charrette, [name] 10/26/2022 2:45 PM Medical Record Number: [id number] Patient Account Number: [id number] Date of Birth/Sex: Treating RN: 1955-06-06 (67 y.o. Cline Cools Primary Care Provider: [name] Other Clinician: Referring Provider: Treating Provider/Extender: Alver Fisher in Treatment: 27 Verbal / Phone Orders: No Diagnosis Coding ICD-10 Coding Code Description I89.0 Lymphedema, not elsewhere classified  y.o. M) Primary Care Provider: [name] Other Clinician: Referring Provider: Treating Provider/Extender: Alver Fisher in Treatment: 64 Active Problems ICD-10 Encounter Code Description Active Date MDM Diagnosis I89.0 Lymphedema, not elsewhere classified 08/04/2021 No Yes I87.333 Chronic venous hypertension (idiopathic) with ulcer and inflammation of 08/04/2021 No Yes bilateral lower extremity L97.828 Non-pressure chronic ulcer of other part of left lower leg with other specified 08/04/2021 No Yes severity L97.818 Non-pressure chronic ulcer of other part of right lower leg with other specified 08/04/2021 No Yes severity E11.621 Type 2  diabetes mellitus with foot ulcer 08/04/2021 No Yes L97.518 Non-pressure chronic ulcer of other part of right foot with other specified 08/04/2021 No Yes Storr, Ramesses D ([id number]) [phone].pdf Page 7 of 12 severity Inactive Problems Resolved Problems Electronic Signature(s) Signed: 10/26/2022 2:42:20 PM By: Allen Derry PA-C Entered By: Allen Derry on 10/26/2022 14:42:20 -------------------------------------------------------------------------------- Progress Note Details Patient Name: Date of Service: Bolding, [name] 10/26/2022 2:45 PM Medical Record Number: [id number] Patient Account Number: [id number] Date of Birth/Sex: Treating RN: 07-14-1955 (67 y.o. M) Primary Care Provider: [name] Other Clinician: Referring Provider: Treating Provider/Extender: Alver Fisher in Treatment: 85 Subjective Chief Complaint Information obtained from Patient 08/04/2021; patient returns to clinic with bilateral leg wounds as well as areas on the right foot History of Present Illness (HPI) ADMISSION 03/22/2021 This is a 67 year old man with a past medical history significant for diabetes type 2, congestive heart failure, peripheral arterial disease, morbid obesity, venous insufficiency, and coronary artery disease. He has been followed by Dr. Loreta Ave in podiatry, who performed a transmetatarsal amputation on the left foot in August 2022. He had issues healing that wound, but based upon Dr. Kenna Gilbert notes, ultimately the TMA wound healed. During his recovery from that surgery, however, ulcers opened up over the DIP joint of the right second and third toe. These have apparently closed and reopened multiple times. It sounds like one of the issues has been moisture accumulation and maceration of the tissues causing them to reopen. At his last visit with Dr. Loreta Ave, on March 01, 2021, there continues to be problems with moisture and he was referred to wound  care for further evaluation and management. He had a formal aortogram with runoff performed prior to his TMA. The findings are copied here: Patient has inline flow to both feet with no significant flow-limiting lesion that would be amenable to percutaneous or open revascularization. He does have an element of small vessel disease and has a short segment occlusion of the distal anterior tibial/dorsalis pedis artery on the left foot but does have posterior tibial artery flow. Would recommend management of wounds with amputation of toes 2 and 3 on the right foot if the wounds do not heal and deteriorate. Transmetatarsal amputation on the left side has as good a blood supply as it is going to get and hopefully this will heal in the future. Formal ABIs were done in January 2023. They are normal bilaterally. ABI Findings: +---------+------------------+-----+----------+--------+ Right Rt Pressure (mmHg)IndexWaveform Comment  +---------+------------------+-----+----------+--------+ Brachial 160    +---------+------------------+-----+----------+--------+ PTA 192 1.06 monophasic  +---------+------------------+-----+----------+--------+ DP 159 0.88 monophasic  +---------+------------------+-----+----------+--------+ Great T oe145 0.80    +---------+------------------+-----+----------+--------+ +--------+------------------+-----+---------+-------+ Left Lt Pressure (mmHg)IndexWaveform Comment +--------+------------------+-----+---------+-------+ [id number]    +--------+------------------+-----+---------+-------+ PTA 204 1.13 triphasic  +--------+------------------+-----+---------+-------+ DP 194 1.07 biphasic   +--------+------------------+-----+---------+-------+ +-------+-----------+-----------+------------+------------+ ABI/TBIT oday's ABIT oday's TBIPrevious ABIPrevious  TBI +-------+-----------+-----------+------------+------------+ Right 1.06 0.8 1.26 0.65  +-------+-----------+-----------+------------+------------+ JASTON, BARTOLOMUCCI D ([id number]) [phone].pdf Page 8 of  y.o. M) Primary Care Provider: [name] Other Clinician: Referring Provider: Treating Provider/Extender: Alver Fisher in Treatment: 64 Active Problems ICD-10 Encounter Code Description Active Date MDM Diagnosis I89.0 Lymphedema, not elsewhere classified 08/04/2021 No Yes I87.333 Chronic venous hypertension (idiopathic) with ulcer and inflammation of 08/04/2021 No Yes bilateral lower extremity L97.828 Non-pressure chronic ulcer of other part of left lower leg with other specified 08/04/2021 No Yes severity L97.818 Non-pressure chronic ulcer of other part of right lower leg with other specified 08/04/2021 No Yes severity E11.621 Type 2  diabetes mellitus with foot ulcer 08/04/2021 No Yes L97.518 Non-pressure chronic ulcer of other part of right foot with other specified 08/04/2021 No Yes Storr, Ramesses D ([id number]) [phone].pdf Page 7 of 12 severity Inactive Problems Resolved Problems Electronic Signature(s) Signed: 10/26/2022 2:42:20 PM By: Allen Derry PA-C Entered By: Allen Derry on 10/26/2022 14:42:20 -------------------------------------------------------------------------------- Progress Note Details Patient Name: Date of Service: Bolding, [name] 10/26/2022 2:45 PM Medical Record Number: [id number] Patient Account Number: [id number] Date of Birth/Sex: Treating RN: 07-14-1955 (67 y.o. M) Primary Care Provider: [name] Other Clinician: Referring Provider: Treating Provider/Extender: Alver Fisher in Treatment: 85 Subjective Chief Complaint Information obtained from Patient 08/04/2021; patient returns to clinic with bilateral leg wounds as well as areas on the right foot History of Present Illness (HPI) ADMISSION 03/22/2021 This is a 67 year old man with a past medical history significant for diabetes type 2, congestive heart failure, peripheral arterial disease, morbid obesity, venous insufficiency, and coronary artery disease. He has been followed by Dr. Loreta Ave in podiatry, who performed a transmetatarsal amputation on the left foot in August 2022. He had issues healing that wound, but based upon Dr. Kenna Gilbert notes, ultimately the TMA wound healed. During his recovery from that surgery, however, ulcers opened up over the DIP joint of the right second and third toe. These have apparently closed and reopened multiple times. It sounds like one of the issues has been moisture accumulation and maceration of the tissues causing them to reopen. At his last visit with Dr. Loreta Ave, on March 01, 2021, there continues to be problems with moisture and he was referred to wound  care for further evaluation and management. He had a formal aortogram with runoff performed prior to his TMA. The findings are copied here: Patient has inline flow to both feet with no significant flow-limiting lesion that would be amenable to percutaneous or open revascularization. He does have an element of small vessel disease and has a short segment occlusion of the distal anterior tibial/dorsalis pedis artery on the left foot but does have posterior tibial artery flow. Would recommend management of wounds with amputation of toes 2 and 3 on the right foot if the wounds do not heal and deteriorate. Transmetatarsal amputation on the left side has as good a blood supply as it is going to get and hopefully this will heal in the future. Formal ABIs were done in January 2023. They are normal bilaterally. ABI Findings: +---------+------------------+-----+----------+--------+ Right Rt Pressure (mmHg)IndexWaveform Comment  +---------+------------------+-----+----------+--------+ Brachial 160    +---------+------------------+-----+----------+--------+ PTA 192 1.06 monophasic  +---------+------------------+-----+----------+--------+ DP 159 0.88 monophasic  +---------+------------------+-----+----------+--------+ Great T oe145 0.80    +---------+------------------+-----+----------+--------+ +--------+------------------+-----+---------+-------+ Left Lt Pressure (mmHg)IndexWaveform Comment +--------+------------------+-----+---------+-------+ [id number]    +--------+------------------+-----+---------+-------+ PTA 204 1.13 triphasic  +--------+------------------+-----+---------+-------+ DP 194 1.07 biphasic   +--------+------------------+-----+---------+-------+ +-------+-----------+-----------+------------+------------+ ABI/TBIT oday's ABIT oday's TBIPrevious ABIPrevious  TBI +-------+-----------+-----------+------------+------------+ Right 1.06 0.8 1.26 0.65  +-------+-----------+-----------+------------+------------+ JASTON, BARTOLOMUCCI D ([id number]) [phone].pdf Page 8 of  with where we stand today. Fortunately I do not see any signs of active infection which is great news. 09-14-2022 upon evaluation today patient appears to be doing well current regard to his wound with section showing signs of improvement would be using endoform which I think is really doing a great job here. Fortunately I do not see any signs of worsening overall. 09-21-2022 upon evaluation today patient appears to be doing well currently in regard to his wound. He has been tolerating the dressing changes without complication. Fortunately there does not appear to be any signs of active infection at this time which is good news. 09-28-2022 upon evaluation today patient appears to be doing well currently in regard to his wound which is actually measuring quite a bit smaller. This looks to be doing well and very pleased with where things stand today I do not see any signs of active infection locally or systemically which is great news. No  fevers, chills, nausea, vomiting, or diarrhea. 10-05-2022 upon evaluation today patient appears to be doing excellent in regard to his wounds. He has been tolerating the dressing changes without complication and in general I do believe that we will make an really good headway towards complete closure. 10/12/2022 upon evaluation today patient's wound is actually showing signs of excellent improvement. I am actually very pleased with where we stand I think that he is moving in the right direction and this is excellent news. I do not see any evidence of worsening overall 10-19-2022 upon evaluation today patient appears to be doing well currently in regard to his wound. He is showing signs of improvement and very pleased with where we stand I think that is making good progress here. 10-26-2022 upon evaluation today patient appears to be doing well currently in regard to his wound. In fact this is showing signs of excellent improvement I am actually very pleased with where we stand I do believe that he is making excellent headway towards complete closure which is great news as well. No fevers, chills, nausea, vomiting, or diarrhea. Electronic Signature(s) Signed: 10/26/2022 3:32:49 PM By: Allen Derry PA-C Entered By: Allen Derry on 10/26/2022 15:32:49 -------------------------------------------------------------------------------- Physical Exam Details Patient Name: Date of Service: Egnew, [name] 10/26/2022 2:45 PM Medical Record Number: [id number] Patient Account Number: [id number] Date of Birth/Sex: Treating RN: 06-05-1955 (67 y.o. M) Primary Care Provider: [name] Other Clinician: Referring Provider: Treating Provider/Extender: Alver Fisher in Treatment: 69 Constitutional Well-nourished and well-hydrated in no acute distress. Respiratory normal breathing without difficulty. Psychiatric EDELMIRO, NIEMI ([id number]) [id number] Page 5 of  12 this patient is able to make decisions and demonstrates good insight into disease process. Alert and Oriented x 3. pleasant and cooperative. Notes Upon inspection patient's wound bed showed signs of good granulation epithelization at this point. I do not see any evidence of worsening infection at this point and in general I do believe that the patient is making excellent headway towards complete closure. Electronic Signature(s) Signed: 10/26/2022 3:33:04 PM By: Allen Derry PA-C Entered By: Allen Derry on 10/26/2022 15:33:04 -------------------------------------------------------------------------------- Physician Orders Details Patient Name: Date of Service: Charrette, [name] 10/26/2022 2:45 PM Medical Record Number: [id number] Patient Account Number: [id number] Date of Birth/Sex: Treating RN: 1955-06-06 (67 y.o. Cline Cools Primary Care Provider: [name] Other Clinician: Referring Provider: Treating Provider/Extender: Alver Fisher in Treatment: 27 Verbal / Phone Orders: No Diagnosis Coding ICD-10 Coding Code Description I89.0 Lymphedema, not elsewhere classified  y.o. M) Primary Care Provider: [name] Other Clinician: Referring Provider: Treating Provider/Extender: Alver Fisher in Treatment: 64 Active Problems ICD-10 Encounter Code Description Active Date MDM Diagnosis I89.0 Lymphedema, not elsewhere classified 08/04/2021 No Yes I87.333 Chronic venous hypertension (idiopathic) with ulcer and inflammation of 08/04/2021 No Yes bilateral lower extremity L97.828 Non-pressure chronic ulcer of other part of left lower leg with other specified 08/04/2021 No Yes severity L97.818 Non-pressure chronic ulcer of other part of right lower leg with other specified 08/04/2021 No Yes severity E11.621 Type 2  diabetes mellitus with foot ulcer 08/04/2021 No Yes L97.518 Non-pressure chronic ulcer of other part of right foot with other specified 08/04/2021 No Yes Storr, Ramesses D ([id number]) [phone].pdf Page 7 of 12 severity Inactive Problems Resolved Problems Electronic Signature(s) Signed: 10/26/2022 2:42:20 PM By: Allen Derry PA-C Entered By: Allen Derry on 10/26/2022 14:42:20 -------------------------------------------------------------------------------- Progress Note Details Patient Name: Date of Service: Bolding, [name] 10/26/2022 2:45 PM Medical Record Number: [id number] Patient Account Number: [id number] Date of Birth/Sex: Treating RN: 07-14-1955 (67 y.o. M) Primary Care Provider: [name] Other Clinician: Referring Provider: Treating Provider/Extender: Alver Fisher in Treatment: 85 Subjective Chief Complaint Information obtained from Patient 08/04/2021; patient returns to clinic with bilateral leg wounds as well as areas on the right foot History of Present Illness (HPI) ADMISSION 03/22/2021 This is a 67 year old man with a past medical history significant for diabetes type 2, congestive heart failure, peripheral arterial disease, morbid obesity, venous insufficiency, and coronary artery disease. He has been followed by Dr. Loreta Ave in podiatry, who performed a transmetatarsal amputation on the left foot in August 2022. He had issues healing that wound, but based upon Dr. Kenna Gilbert notes, ultimately the TMA wound healed. During his recovery from that surgery, however, ulcers opened up over the DIP joint of the right second and third toe. These have apparently closed and reopened multiple times. It sounds like one of the issues has been moisture accumulation and maceration of the tissues causing them to reopen. At his last visit with Dr. Loreta Ave, on March 01, 2021, there continues to be problems with moisture and he was referred to wound  care for further evaluation and management. He had a formal aortogram with runoff performed prior to his TMA. The findings are copied here: Patient has inline flow to both feet with no significant flow-limiting lesion that would be amenable to percutaneous or open revascularization. He does have an element of small vessel disease and has a short segment occlusion of the distal anterior tibial/dorsalis pedis artery on the left foot but does have posterior tibial artery flow. Would recommend management of wounds with amputation of toes 2 and 3 on the right foot if the wounds do not heal and deteriorate. Transmetatarsal amputation on the left side has as good a blood supply as it is going to get and hopefully this will heal in the future. Formal ABIs were done in January 2023. They are normal bilaterally. ABI Findings: +---------+------------------+-----+----------+--------+ Right Rt Pressure (mmHg)IndexWaveform Comment  +---------+------------------+-----+----------+--------+ Brachial 160    +---------+------------------+-----+----------+--------+ PTA 192 1.06 monophasic  +---------+------------------+-----+----------+--------+ DP 159 0.88 monophasic  +---------+------------------+-----+----------+--------+ Great T oe145 0.80    +---------+------------------+-----+----------+--------+ +--------+------------------+-----+---------+-------+ Left Lt Pressure (mmHg)IndexWaveform Comment +--------+------------------+-----+---------+-------+ [id number]    +--------+------------------+-----+---------+-------+ PTA 204 1.13 triphasic  +--------+------------------+-----+---------+-------+ DP 194 1.07 biphasic   +--------+------------------+-----+---------+-------+ +-------+-----------+-----------+------------+------------+ ABI/TBIT oday's ABIT oday's TBIPrevious ABIPrevious  TBI +-------+-----------+-----------+------------+------------+ Right 1.06 0.8 1.26 0.65  +-------+-----------+-----------+------------+------------+ JASTON, BARTOLOMUCCI D ([id number]) [phone].pdf Page 8 of

## 2022-10-30 ENCOUNTER — Other Ambulatory Visit (HOSPITAL_COMMUNITY): Payer: Self-pay | Admitting: Family Medicine

## 2022-10-30 DIAGNOSIS — R001 Bradycardia, unspecified: Secondary | ICD-10-CM

## 2022-11-01 DIAGNOSIS — G4733 Obstructive sleep apnea (adult) (pediatric): Secondary | ICD-10-CM | POA: Diagnosis not present

## 2022-11-02 ENCOUNTER — Encounter (HOSPITAL_BASED_OUTPATIENT_CLINIC_OR_DEPARTMENT_OTHER): Payer: Medicare Other | Admitting: Physician Assistant

## 2022-11-02 DIAGNOSIS — M1990 Unspecified osteoarthritis, unspecified site: Secondary | ICD-10-CM | POA: Diagnosis not present

## 2022-11-02 DIAGNOSIS — E1151 Type 2 diabetes mellitus with diabetic peripheral angiopathy without gangrene: Secondary | ICD-10-CM | POA: Diagnosis not present

## 2022-11-02 DIAGNOSIS — Z6841 Body Mass Index (BMI) 40.0 and over, adult: Secondary | ICD-10-CM | POA: Diagnosis not present

## 2022-11-02 DIAGNOSIS — Z89432 Acquired absence of left foot: Secondary | ICD-10-CM | POA: Diagnosis not present

## 2022-11-02 DIAGNOSIS — Z7901 Long term (current) use of anticoagulants: Secondary | ICD-10-CM | POA: Diagnosis not present

## 2022-11-02 DIAGNOSIS — L97518 Non-pressure chronic ulcer of other part of right foot with other specified severity: Secondary | ICD-10-CM | POA: Diagnosis not present

## 2022-11-02 DIAGNOSIS — I4891 Unspecified atrial fibrillation: Secondary | ICD-10-CM | POA: Diagnosis not present

## 2022-11-02 DIAGNOSIS — I2510 Atherosclerotic heart disease of native coronary artery without angina pectoris: Secondary | ICD-10-CM | POA: Diagnosis not present

## 2022-11-02 DIAGNOSIS — Z950 Presence of cardiac pacemaker: Secondary | ICD-10-CM | POA: Diagnosis not present

## 2022-11-02 DIAGNOSIS — M109 Gout, unspecified: Secondary | ICD-10-CM | POA: Diagnosis not present

## 2022-11-02 DIAGNOSIS — I872 Venous insufficiency (chronic) (peripheral): Secondary | ICD-10-CM | POA: Diagnosis not present

## 2022-11-02 DIAGNOSIS — L97828 Non-pressure chronic ulcer of other part of left lower leg with other specified severity: Secondary | ICD-10-CM | POA: Diagnosis not present

## 2022-11-02 DIAGNOSIS — I110 Hypertensive heart disease with heart failure: Secondary | ICD-10-CM | POA: Diagnosis not present

## 2022-11-02 DIAGNOSIS — I87333 Chronic venous hypertension (idiopathic) with ulcer and inflammation of bilateral lower extremity: Secondary | ICD-10-CM | POA: Diagnosis not present

## 2022-11-02 DIAGNOSIS — L97818 Non-pressure chronic ulcer of other part of right lower leg with other specified severity: Secondary | ICD-10-CM | POA: Diagnosis not present

## 2022-11-02 DIAGNOSIS — S90521A Blister (nonthermal), right ankle, initial encounter: Secondary | ICD-10-CM | POA: Diagnosis not present

## 2022-11-02 DIAGNOSIS — I509 Heart failure, unspecified: Secondary | ICD-10-CM | POA: Diagnosis not present

## 2022-11-02 DIAGNOSIS — E11621 Type 2 diabetes mellitus with foot ulcer: Secondary | ICD-10-CM | POA: Diagnosis not present

## 2022-11-02 DIAGNOSIS — Z8572 Personal history of non-Hodgkin lymphomas: Secondary | ICD-10-CM | POA: Diagnosis not present

## 2022-11-02 DIAGNOSIS — I890 Lymphedema, not elsewhere classified: Secondary | ICD-10-CM | POA: Diagnosis not present

## 2022-11-02 NOTE — Progress Notes (Addendum)
11/02/2022 3:19:13 PM Version By: Allen Derry PA-C Entered By: Shawn Stall on 11/04/2022 10:08:06 -------------------------------------------------------------------------------- SuperBill Details Patient Name: Date of Service: [name], [name]. 11/02/2022 Medical Record Number: [id number] Patient Account Number:  [id number] Date of Birth/Sex: Treating RN: Nov 03, 1955 (67 y.o. M) Primary Care Provider: [name] Other Clinician: Referring Provider: Treating Provider/Extender: Alver Fisher in Treatment: 65 Diagnosis Coding ICD-10 Codes Code Description I89.0 Lymphedema, not elsewhere classified I87.333 Chronic venous hypertension (idiopathic) with ulcer and inflammation of bilateral lower extremity L97.828 Non-pressure chronic ulcer of other part of left lower leg with other specified severity L97.818 Non-pressure chronic ulcer of other part of right lower leg with other specified severity E11.621 Type 2 diabetes mellitus with foot ulcer L97.518 Non-pressure chronic ulcer of other part of right foot with other specified severity Facility Procedures : 3 CPT4 Code: 4742595 Description: (Facility Use Only) [phone] - APPLY MULTLAY COMPRS LWR LT LEG ICD-10 Diagnosis Description I89.0 Lymphedema, not elsewhere classified I87.333 Chronic venous hypertension (idiopathic) with ulcer and inflammation of bilateral lower L97.828  Non-pressure chronic ulcer of other part of left lower leg with other specified severit L97.818 Non-pressure chronic ulcer of other part of right lower leg with other specified severi Modifier: extremity y ty Quantity: 1 Physician Procedures : CPT4 Code Description Modifier [id number] - WC PHYS LEVEL 4 - EST PT ICD-10 Diagnosis Description I89.0 Lymphedema, not elsewhere classified I87.333 Chronic venous hypertension (idiopathic) with ulcer and inflammation of bilateral lower extremity  L97.828 Non-pressure chronic ulcer of other part of left lower leg with other specified severity L97.818 Non-pressure chronic ulcer of other part of right lower leg with other specified severity Quantity: 1 Electronic Signature(s) Signed: 11/09/2022 2:25:12 PM By: Darleen Crocker [name] ([id number]) [id number] Page 13 of 13 Signed: 11/09/2022  4:33:53 PM By: Allen Derry PA-C Previous Signature: 11/02/2022 3:19:27 PM Version By: Allen Derry PA-C Entered By: Pearletha Alfred on 11/09/2022 11:25:12  in (Dispense As Written) 1 x Per Week/30 Days ary Discharge Instructions: Moisten with saline Secondary Dressing: CarboFLEX Odor Control Dressing, 4x4 in 1 x Per Week/30 Days Discharge Instructions: Apply over primary dressing as directed. Secondary Dressing: Drawtex 4x4 in 1 x Per Week/30 Days Discharge Instructions: Apply over primary dressing as directed. Secondary Dressing: Zetuvit Plus 4x8 in 1 x Per Week/30 Days Discharge Instructions: Apply over primary dressing as directed. Compression Wrap: Urgo K2, (equivalent to a 4 layer) two layer compression system, regular 1 x Per Week/30 Days Discharge Instructions: Apply Urgo K2 as directed (alternative to 4 layer compression). Patient Medications llergies: latex, ACE Inhibitors, penicillin A Notifications Medication Indication Start End 11/02/2022 ketoconazole DOSE topical 2 % cream - cream topical once daily applied to the right leg just below the knee at the site of the rash x 30 days Electronic Signature(s) Signed: 11/02/2022 3:19:02 PM By: Allen Derry PA-C Entered By: Allen Derry on 11/02/2022 12:19:01 -------------------------------------------------------------------------------- Problem List Details Patient Name: Date of Service: [name], [name] 11/02/2022 2:45 PM Medical Record Number: [id number] Patient Account Number: [id number] Date of Birth/Sex: Treating RN: 1955-10-28 (67 y.o. M) Primary Care Provider: [name] Other Clinician: Referring Provider: Treating Provider/Extender: Alver Fisher in Treatment: 95 Active Problems ICD-10 Encounter Code Description Active Date MDM Diagnosis I89.0 Lymphedema, not elsewhere classified 08/04/2021 No Yes I87.333 Chronic venous hypertension (idiopathic) with ulcer and inflammation of 08/04/2021 No Yes bilateral lower extremity PERCELL, SCHLAUD ([id number]) [id number] Page 7 of 13 [phone] Non-pressure chronic ulcer of other part of left lower leg with other specified 08/04/2021 No Yes severity L97.818 Non-pressure chronic ulcer of other part of right lower leg with other specified 08/04/2021 No Yes severity E11.621 Type 2 diabetes mellitus with foot ulcer 08/04/2021 No Yes L97.518 Non-pressure chronic ulcer of other part of right foot with other specified 08/04/2021 No Yes severity Inactive Problems Resolved Problems Electronic Signature(s) Signed: 11/02/2022 2:41:10 PM By: Allen Derry PA-C Entered By: Allen Derry on 11/02/2022 11:41:09 -------------------------------------------------------------------------------- Progress Note Details Patient Name: Date of Service: [name], [name]. 11/02/2022 2:45 PM Medical Record Number: [id number] Patient Account Number: [id number] Date of Birth/Sex: Treating RN: [birth year] (67 y.o. M) Primary Care Provider: [name] Other Clinician: Referring Provider: Treating Provider/Extender: Alver Fisher in Treatment: 24 Subjective Chief Complaint Information obtained  from Patient 08/04/2021; patient returns to clinic with bilateral leg wounds as well as areas on the right foot History of Present Illness (HPI) ADMISSION 03/22/2021 This is a 67 year old man with a past medical history significant for diabetes type 2, congestive heart failure, peripheral arterial disease, morbid obesity, venous insufficiency, and coronary artery disease. He has been followed by Dr. Loreta Ave in podiatry, who performed a transmetatarsal amputation on the left foot in August 2022. He had issues healing that wound, but based upon Dr. Kenna Gilbert notes, ultimately the TMA wound healed. During his recovery from that surgery, however, ulcers opened up over the DIP joint of the right second and third toe. These have apparently closed and reopened multiple times. It sounds like one of the issues has been moisture accumulation and maceration of the tissues causing them to reopen. At his last visit with Dr. Loreta Ave, on March 01, 2021, there continues to be problems with moisture and he was referred to wound care for further evaluation and management. He had a formal aortogram with runoff performed prior to his TMA. The findings are copied here: Patient has inline flow to both  in (Dispense As Written) 1 x Per Week/30 Days ary Discharge Instructions: Moisten with saline Secondary Dressing: CarboFLEX Odor Control Dressing, 4x4 in 1 x Per Week/30 Days Discharge Instructions: Apply over primary dressing as directed. Secondary Dressing: Drawtex 4x4 in 1 x Per Week/30 Days Discharge Instructions: Apply over primary dressing as directed. Secondary Dressing: Zetuvit Plus 4x8 in 1 x Per Week/30 Days Discharge Instructions: Apply over primary dressing as directed. Compression Wrap: Urgo K2, (equivalent to a 4 layer) two layer compression system, regular 1 x Per Week/30 Days Discharge Instructions: Apply Urgo K2 as directed (alternative to 4 layer compression). Patient Medications llergies: latex, ACE Inhibitors, penicillin A Notifications Medication Indication Start End 11/02/2022 ketoconazole DOSE topical 2 % cream - cream topical once daily applied to the right leg just below the knee at the site of the rash x 30 days Electronic Signature(s) Signed: 11/02/2022 3:19:02 PM By: Allen Derry PA-C Entered By: Allen Derry on 11/02/2022 12:19:01 -------------------------------------------------------------------------------- Problem List Details Patient Name: Date of Service: [name], [name] 11/02/2022 2:45 PM Medical Record Number: [id number] Patient Account Number: [id number] Date of Birth/Sex: Treating RN: 1955-10-28 (67 y.o. M) Primary Care Provider: [name] Other Clinician: Referring Provider: Treating Provider/Extender: Alver Fisher in Treatment: 95 Active Problems ICD-10 Encounter Code Description Active Date MDM Diagnosis I89.0 Lymphedema, not elsewhere classified 08/04/2021 No Yes I87.333 Chronic venous hypertension (idiopathic) with ulcer and inflammation of 08/04/2021 No Yes bilateral lower extremity PERCELL, SCHLAUD ([id number]) [id number] Page 7 of 13 [phone] Non-pressure chronic ulcer of other part of left lower leg with other specified 08/04/2021 No Yes severity L97.818 Non-pressure chronic ulcer of other part of right lower leg with other specified 08/04/2021 No Yes severity E11.621 Type 2 diabetes mellitus with foot ulcer 08/04/2021 No Yes L97.518 Non-pressure chronic ulcer of other part of right foot with other specified 08/04/2021 No Yes severity Inactive Problems Resolved Problems Electronic Signature(s) Signed: 11/02/2022 2:41:10 PM By: Allen Derry PA-C Entered By: Allen Derry on 11/02/2022 11:41:09 -------------------------------------------------------------------------------- Progress Note Details Patient Name: Date of Service: [name], [name]. 11/02/2022 2:45 PM Medical Record Number: [id number] Patient Account Number: [id number] Date of Birth/Sex: Treating RN: [birth year] (67 y.o. M) Primary Care Provider: [name] Other Clinician: Referring Provider: Treating Provider/Extender: Alver Fisher in Treatment: 24 Subjective Chief Complaint Information obtained  from Patient 08/04/2021; patient returns to clinic with bilateral leg wounds as well as areas on the right foot History of Present Illness (HPI) ADMISSION 03/22/2021 This is a 67 year old man with a past medical history significant for diabetes type 2, congestive heart failure, peripheral arterial disease, morbid obesity, venous insufficiency, and coronary artery disease. He has been followed by Dr. Loreta Ave in podiatry, who performed a transmetatarsal amputation on the left foot in August 2022. He had issues healing that wound, but based upon Dr. Kenna Gilbert notes, ultimately the TMA wound healed. During his recovery from that surgery, however, ulcers opened up over the DIP joint of the right second and third toe. These have apparently closed and reopened multiple times. It sounds like one of the issues has been moisture accumulation and maceration of the tissues causing them to reopen. At his last visit with Dr. Loreta Ave, on March 01, 2021, there continues to be problems with moisture and he was referred to wound care for further evaluation and management. He had a formal aortogram with runoff performed prior to his TMA. The findings are copied here: Patient has inline flow to both  [name], [name] ([id number]) [id number] Page 1 of 13 Visit Report for 11/02/2022 Chief Complaint Document Details Patient Name: Date of Service: [name], [name] 11/02/2022 2:45 PM Medical Record Number: [id number] Patient Account Number: [id number] Date of Birth/Sex: Treating RN: Jul 14, 1955 (67 y.o. M) Primary Care Provider: [name] Other Clinician: Referring Provider: Treating Provider/Extender: Alver Fisher in Treatment: 10 Information Obtained from: Patient Chief Complaint 08/04/2021; patient returns to clinic with bilateral leg wounds as well as areas on the right foot Electronic Signature(s) Signed: 11/02/2022 2:41:17 PM By: Allen Derry PA-C Entered By: Allen Derry on 11/02/2022 11:41:17 -------------------------------------------------------------------------------- HPI Details Patient Name: Date of Service: [name], [name]. 11/02/2022 2:45 PM Medical Record Number: [id number] Patient Account Number: [id number] Date of Birth/Sex: Treating RN: 1955/01/21 (67 y.o. M) Primary Care Provider: [name] Other Clinician: Referring Provider: Treating Provider/Extender: Alver Fisher in Treatment: 63 History of Present Illness HPI Description: ADMISSION 03/22/2021 This is a 67 year old man with a past medical history significant for diabetes type 2, congestive heart failure, peripheral arterial disease, morbid obesity, venous insufficiency, and coronary artery disease. He has been followed by Dr. Loreta Ave in podiatry, who performed a transmetatarsal amputation on the left foot in August 2022. He had issues healing that wound, but based upon Dr. Kenna Gilbert notes, ultimately the TMA wound healed. During his recovery from that surgery, however, ulcers opened up over the DIP joint of the right second and third toe. These have apparently closed and reopened multiple times. It sounds like one of the issues has been  moisture accumulation and maceration of the tissues causing them to reopen. At his last visit with Dr. Loreta Ave, on March 01, 2021, there continues to be problems with moisture and he was referred to wound care for further evaluation and management. He had a formal aortogram with runoff performed prior to his TMA. The findings are copied here: Patient has inline flow to both feet with no significant flow-limiting lesion that would be amenable to percutaneous or open revascularization. He does have an element of small vessel disease and has a short segment occlusion of the distal anterior tibial/dorsalis pedis artery on the left foot but does have posterior tibial artery flow. Would recommend management of wounds with amputation of toes 2 and 3 on the right foot if the wounds do not heal and deteriorate. Transmetatarsal amputation on the left side has as good a blood supply as it is going to get and hopefully this will heal in the future. Formal ABIs were done in January 2023. They are normal bilaterally. ABI Findings: +---------+------------------+-----+----------+--------+ Right Rt Pressure (mmHg)IndexWaveform Comment  +---------+------------------+-----+----------+--------+ Brachial 160     +---------+------------------+-----+----------+--------+ PTA 192 1.06 monophasic  +---------+------------------+-----+----------+--------+ DP 159 0.88 monophasic  +---------+------------------+-----+----------+--------+ Great T oe145 0.80    +---------+------------------+-----+----------+--------+ +--------+------------------+-----+---------+-------+ [name] ([id number]) [id number] Page 2 of 13 Left Lt Pressure (mmHg)IndexWaveform Comment +--------+------------------+-----+---------+-------+ [id number]     +--------+------------------+-----+---------+-------+ PTA 204 1.13 triphasic   +--------+------------------+-----+---------+-------+ DP 194 1.07 biphasic   +--------+------------------+-----+---------+-------+ +-------+-----------+-----------+------------+------------+ ABI/TBIT oday's ABIT oday's TBIPrevious ABIPrevious TBI +-------+-----------+-----------+------------+------------+ Right 1.06 0.8 1.26 0.65  +-------+-----------+-----------+------------+------------+ Left 1.13 amputation 1.15 amputation  +-------+-----------+-----------+------------+------------+ Previous ABI on 08/06/20 at [facility] Pedal pressures falsely elevated due to medial calcification. Summary: Right: Resting right ankle-brachial index is within normal range. The right toe-brachial index is normal. Left: Resting left ankle-brachial index is within normal range. READMISSION 08/04/2021 Mr. Null is now a 67 year old man who I remember from this clinic many years ago I  [name], [name] ([id number]) [id number] Page 1 of 13 Visit Report for 11/02/2022 Chief Complaint Document Details Patient Name: Date of Service: [name], [name] 11/02/2022 2:45 PM Medical Record Number: [id number] Patient Account Number: [id number] Date of Birth/Sex: Treating RN: Jul 14, 1955 (67 y.o. M) Primary Care Provider: [name] Other Clinician: Referring Provider: Treating Provider/Extender: Alver Fisher in Treatment: 10 Information Obtained from: Patient Chief Complaint 08/04/2021; patient returns to clinic with bilateral leg wounds as well as areas on the right foot Electronic Signature(s) Signed: 11/02/2022 2:41:17 PM By: Allen Derry PA-C Entered By: Allen Derry on 11/02/2022 11:41:17 -------------------------------------------------------------------------------- HPI Details Patient Name: Date of Service: [name], [name]. 11/02/2022 2:45 PM Medical Record Number: [id number] Patient Account Number: [id number] Date of Birth/Sex: Treating RN: 1955/01/21 (67 y.o. M) Primary Care Provider: [name] Other Clinician: Referring Provider: Treating Provider/Extender: Alver Fisher in Treatment: 63 History of Present Illness HPI Description: ADMISSION 03/22/2021 This is a 67 year old man with a past medical history significant for diabetes type 2, congestive heart failure, peripheral arterial disease, morbid obesity, venous insufficiency, and coronary artery disease. He has been followed by Dr. Loreta Ave in podiatry, who performed a transmetatarsal amputation on the left foot in August 2022. He had issues healing that wound, but based upon Dr. Kenna Gilbert notes, ultimately the TMA wound healed. During his recovery from that surgery, however, ulcers opened up over the DIP joint of the right second and third toe. These have apparently closed and reopened multiple times. It sounds like one of the issues has been  moisture accumulation and maceration of the tissues causing them to reopen. At his last visit with Dr. Loreta Ave, on March 01, 2021, there continues to be problems with moisture and he was referred to wound care for further evaluation and management. He had a formal aortogram with runoff performed prior to his TMA. The findings are copied here: Patient has inline flow to both feet with no significant flow-limiting lesion that would be amenable to percutaneous or open revascularization. He does have an element of small vessel disease and has a short segment occlusion of the distal anterior tibial/dorsalis pedis artery on the left foot but does have posterior tibial artery flow. Would recommend management of wounds with amputation of toes 2 and 3 on the right foot if the wounds do not heal and deteriorate. Transmetatarsal amputation on the left side has as good a blood supply as it is going to get and hopefully this will heal in the future. Formal ABIs were done in January 2023. They are normal bilaterally. ABI Findings: +---------+------------------+-----+----------+--------+ Right Rt Pressure (mmHg)IndexWaveform Comment  +---------+------------------+-----+----------+--------+ Brachial 160     +---------+------------------+-----+----------+--------+ PTA 192 1.06 monophasic  +---------+------------------+-----+----------+--------+ DP 159 0.88 monophasic  +---------+------------------+-----+----------+--------+ Great T oe145 0.80    +---------+------------------+-----+----------+--------+ +--------+------------------+-----+---------+-------+ [name] ([id number]) [id number] Page 2 of 13 Left Lt Pressure (mmHg)IndexWaveform Comment +--------+------------------+-----+---------+-------+ [id number]     +--------+------------------+-----+---------+-------+ PTA 204 1.13 triphasic   +--------+------------------+-----+---------+-------+ DP 194 1.07 biphasic   +--------+------------------+-----+---------+-------+ +-------+-----------+-----------+------------+------------+ ABI/TBIT oday's ABIT oday's TBIPrevious ABIPrevious TBI +-------+-----------+-----------+------------+------------+ Right 1.06 0.8 1.26 0.65  +-------+-----------+-----------+------------+------------+ Left 1.13 amputation 1.15 amputation  +-------+-----------+-----------+------------+------------+ Previous ABI on 08/06/20 at [facility] Pedal pressures falsely elevated due to medial calcification. Summary: Right: Resting right ankle-brachial index is within normal range. The right toe-brachial index is normal. Left: Resting left ankle-brachial index is within normal range. READMISSION 08/04/2021 Mr. Null is now a 67 year old man who I remember from this clinic many years ago I  in (Dispense As Written) 1 x Per Week/30 Days ary Discharge Instructions: Moisten with saline Secondary Dressing: CarboFLEX Odor Control Dressing, 4x4 in 1 x Per Week/30 Days Discharge Instructions: Apply over primary dressing as directed. Secondary Dressing: Drawtex 4x4 in 1 x Per Week/30 Days Discharge Instructions: Apply over primary dressing as directed. Secondary Dressing: Zetuvit Plus 4x8 in 1 x Per Week/30 Days Discharge Instructions: Apply over primary dressing as directed. Compression Wrap: Urgo K2, (equivalent to a 4 layer) two layer compression system, regular 1 x Per Week/30 Days Discharge Instructions: Apply Urgo K2 as directed (alternative to 4 layer compression). Patient Medications llergies: latex, ACE Inhibitors, penicillin A Notifications Medication Indication Start End 11/02/2022 ketoconazole DOSE topical 2 % cream - cream topical once daily applied to the right leg just below the knee at the site of the rash x 30 days Electronic Signature(s) Signed: 11/02/2022 3:19:02 PM By: Allen Derry PA-C Entered By: Allen Derry on 11/02/2022 12:19:01 -------------------------------------------------------------------------------- Problem List Details Patient Name: Date of Service: [name], [name] 11/02/2022 2:45 PM Medical Record Number: [id number] Patient Account Number: [id number] Date of Birth/Sex: Treating RN: 1955-10-28 (67 y.o. M) Primary Care Provider: [name] Other Clinician: Referring Provider: Treating Provider/Extender: Alver Fisher in Treatment: 95 Active Problems ICD-10 Encounter Code Description Active Date MDM Diagnosis I89.0 Lymphedema, not elsewhere classified 08/04/2021 No Yes I87.333 Chronic venous hypertension (idiopathic) with ulcer and inflammation of 08/04/2021 No Yes bilateral lower extremity PERCELL, SCHLAUD ([id number]) [id number] Page 7 of 13 [phone] Non-pressure chronic ulcer of other part of left lower leg with other specified 08/04/2021 No Yes severity L97.818 Non-pressure chronic ulcer of other part of right lower leg with other specified 08/04/2021 No Yes severity E11.621 Type 2 diabetes mellitus with foot ulcer 08/04/2021 No Yes L97.518 Non-pressure chronic ulcer of other part of right foot with other specified 08/04/2021 No Yes severity Inactive Problems Resolved Problems Electronic Signature(s) Signed: 11/02/2022 2:41:10 PM By: Allen Derry PA-C Entered By: Allen Derry on 11/02/2022 11:41:09 -------------------------------------------------------------------------------- Progress Note Details Patient Name: Date of Service: [name], [name]. 11/02/2022 2:45 PM Medical Record Number: [id number] Patient Account Number: [id number] Date of Birth/Sex: Treating RN: [birth year] (67 y.o. M) Primary Care Provider: [name] Other Clinician: Referring Provider: Treating Provider/Extender: Alver Fisher in Treatment: 24 Subjective Chief Complaint Information obtained  from Patient 08/04/2021; patient returns to clinic with bilateral leg wounds as well as areas on the right foot History of Present Illness (HPI) ADMISSION 03/22/2021 This is a 67 year old man with a past medical history significant for diabetes type 2, congestive heart failure, peripheral arterial disease, morbid obesity, venous insufficiency, and coronary artery disease. He has been followed by Dr. Loreta Ave in podiatry, who performed a transmetatarsal amputation on the left foot in August 2022. He had issues healing that wound, but based upon Dr. Kenna Gilbert notes, ultimately the TMA wound healed. During his recovery from that surgery, however, ulcers opened up over the DIP joint of the right second and third toe. These have apparently closed and reopened multiple times. It sounds like one of the issues has been moisture accumulation and maceration of the tissues causing them to reopen. At his last visit with Dr. Loreta Ave, on March 01, 2021, there continues to be problems with moisture and he was referred to wound care for further evaluation and management. He had a formal aortogram with runoff performed prior to his TMA. The findings are copied here: Patient has inline flow to both  with where we stand today. Fortunately I do not see any signs of active infection which is great news. 09-14-2022 upon evaluation today patient appears to be doing well current regard to his wound with section showing signs of improvement would be using endoform which I think is really doing a great job here. Fortunately I do not see any signs of worsening overall. 09-21-2022 upon evaluation today patient appears to be doing well currently in regard to his wound. He has been tolerating the dressing changes without complication. Fortunately there does not appear to be any signs of active infection at this time which is good news. 09-28-2022 upon evaluation today patient appears to be doing well currently in regard to his wound which is actually measuring quite a bit smaller. This looks to be doing well and very pleased with where things stand today I do not see any signs of active infection locally or systemically which is great news. No  fevers, chills, nausea, vomiting, or diarrhea. 10-05-2022 upon evaluation today patient appears to be doing excellent in regard to his wounds. He has been tolerating the dressing changes without complication and in general I do believe that we will make an really good headway towards complete closure. 10/12/2022 upon evaluation today patient's wound is actually showing signs of excellent improvement. I am actually very pleased with where we stand I think that he is moving in the right direction and this is excellent news. I do not see any evidence of worsening overall 10-19-2022 upon evaluation today patient appears to be doing well currently in regard to his wound. He is showing signs of improvement and very pleased with where we stand I think that is making good progress here. 10-26-2022 upon evaluation today patient appears to be doing well currently in regard to his wound. In fact this is showing signs of excellent improvement I am actually very pleased with where we stand I do believe that he is making excellent headway towards complete closure which is great news as well. No fevers, chills, nausea, vomiting, or diarrhea. 11-02-2022 upon evaluation today patient appears to be doing great in regard to his wound although he does have an area at the top of the wrap which sometimes gets moist which to be honest he actually appears to have a little bit of a fungal infection at this point. I think that we may need to treat this he can get to it and put some cream in there he actually does that on his own UA with some CeraVe. Electronic Signature(s) Signed: 11/02/2022 3:15:25 PM By: Allen Derry PA-C Entered By: Allen Derry on 11/02/2022 12:15:25 -------------------------------------------------------------------------------- Physical Exam Details Patient Name: Date of Service: [name], [name]. 11/02/2022 2:45 PM Medical Record Number: [id number] Patient Account Number: [id number] Date of Birth/Sex:  Treating RN: November 08, 1955 (67 y.o. M) Primary Care Provider: [name] Other Clinician: Referring Provider: Treating Provider/Extender: Alver Fisher in Treatment: 45 Constitutional Well-nourished and well-hydrated in no acute distress. Respiratory KASTON, SPATA ([id number]) [id number] Page 5 of 13 normal breathing without difficulty. Psychiatric this patient is able to make decisions and demonstrates good insight into disease process. Alert and Oriented x 3. pleasant and cooperative. Notes Upon inspection patient's wound bed actually showed signs of excellent improvement at this point and I overall feel like that he is really making good headway here towards closure I am pleased with what we are seeing I would recommend that we have the patient go ahead and continue  with the endoform to the main wound I think this is doing an awesome job. We are good to go ahead and see about getting some ketoconazole into the area in question at the top of his wrap where this look like there may be a little bit more of a fungal infection here to be honest. Electronic Signature(s) Signed: 11/02/2022 3:15:55 PM By: Allen Derry PA-C Entered By: Allen Derry on 11/02/2022 12:15:55 -------------------------------------------------------------------------------- Physician Orders Details Patient Name: Date of Service: [name], [name] 11/02/2022 2:45 PM Medical Record Number: [id number] Patient Account Number: [id number] Date of Birth/Sex: Treating RN: Oct 03, 1955 (67 y.o. Cline Cools Primary Care Provider: [name] Other Clinician: Referring Provider: Treating Provider/Extender: Alver Fisher in Treatment: 49 Verbal / Phone Orders: No Diagnosis Coding ICD-10 Coding Code Description I89.0 Lymphedema, not elsewhere classified I87.333 Chronic venous hypertension (idiopathic) with ulcer and inflammation of bilateral lower  extremity L97.828 Non-pressure chronic ulcer of other part of left lower leg with other specified severity L97.818 Non-pressure chronic ulcer of other part of right lower leg with other specified severity E11.621 Type 2 diabetes mellitus with foot ulcer L97.518 Non-pressure chronic ulcer of other part of right foot with other specified severity Follow-up Appointments ppointment in 1 week. Leonard Schwartz Wednesday 11/02/2022 (already scheduled) Return A ppointment in 2 weeks. Leonard Schwartz Wednesday (already scheduled) Return A Return appointment in 3 weeks. Leonard Schwartz Wednesday [facility] office to schedule) Return appointment in 1 month. Leonard Schwartz Wednesday [facility] office to schedule) Anesthetic (In clinic) Topical Lidocaine 5% applied to wound bed - Used in Clinic Prior to debridement (In clinic) Topical Lidocaine 4% applied to wound bed Cellular or Tissue Based Products Cellular or Tissue Based Product Type: - Run IVR for Kerrecis- denied #1 Apligraf applied 02/23/2022 #2 Apligraf applied 03/02/2022 #3 Apligraf applied 03/16/2022 #4 Apligraf applied 03/30/2022 HOLD APLIGRAF this week. Cellular or Tissue Based Product applied to wound bed, secured with steri-strips, cover with Adaptic or Mepitel. (DO NOT REMOVE). Bathing/ Shower/ Hygiene May shower with protection but do not get wound dressing(s) wet. Protect dressing(s) with water repellant cover (for example, large plastic bag) or a cast cover and may then take shower. Edema Control - Lymphedema / SCD / Other Elevate legs to the level of the heart or above for 30 minutes daily and/or when sitting for 3-4 times a day throughout the day. Avoid standing for long periods of time. Patient to wear own compression stockings every day. - apply in the morning and remove at night left leg. Exercise regularly Moisturize legs daily. - apply every night before bed. Compression stocking or Garment 30-40 mm/Hg pressure to: - wear your VIVE compression stocking to left leg.  apply in the morning and remove at night. Non Wound Condition CAINON, SHUBA ([id number]) [id number] Page 6 of 13 Right Lower Extremity pply the following to affected area as directed: - ketoconazole A Wound Treatment Wound #7 - Malleolus Wound Laterality: Right, Medial Cleanser: Soap and Water 1 x Per Week/30 Days Discharge Instructions: May shower and wash wound with dial antibacterial soap and water prior to dressing change. Cleanser: Vashe 5.8 (oz) 1 x Per Week/30 Days Discharge Instructions: Cleanse the wound with Vashe prior to applying a clean dressing using gauze sponges, not tissue or cotton balls. Peri-Wound Care: Sween Lotion (Moisturizing lotion) 1 x Per Week/30 Days Discharge Instructions: Apply moisturizing lotion as directed Topical: Gentamicin 1 x Per Week/30 Days Discharge Instructions: As directed by physician Prim Dressing: Endoform 2x2  11/02/2022 3:19:13 PM Version By: Allen Derry PA-C Entered By: Shawn Stall on 11/04/2022 10:08:06 -------------------------------------------------------------------------------- SuperBill Details Patient Name: Date of Service: [name], [name]. 11/02/2022 Medical Record Number: [id number] Patient Account Number:  [id number] Date of Birth/Sex: Treating RN: Nov 03, 1955 (67 y.o. M) Primary Care Provider: [name] Other Clinician: Referring Provider: Treating Provider/Extender: Alver Fisher in Treatment: 65 Diagnosis Coding ICD-10 Codes Code Description I89.0 Lymphedema, not elsewhere classified I87.333 Chronic venous hypertension (idiopathic) with ulcer and inflammation of bilateral lower extremity L97.828 Non-pressure chronic ulcer of other part of left lower leg with other specified severity L97.818 Non-pressure chronic ulcer of other part of right lower leg with other specified severity E11.621 Type 2 diabetes mellitus with foot ulcer L97.518 Non-pressure chronic ulcer of other part of right foot with other specified severity Facility Procedures : 3 CPT4 Code: 4742595 Description: (Facility Use Only) [phone] - APPLY MULTLAY COMPRS LWR LT LEG ICD-10 Diagnosis Description I89.0 Lymphedema, not elsewhere classified I87.333 Chronic venous hypertension (idiopathic) with ulcer and inflammation of bilateral lower L97.828  Non-pressure chronic ulcer of other part of left lower leg with other specified severit L97.818 Non-pressure chronic ulcer of other part of right lower leg with other specified severi Modifier: extremity y ty Quantity: 1 Physician Procedures : CPT4 Code Description Modifier [id number] - WC PHYS LEVEL 4 - EST PT ICD-10 Diagnosis Description I89.0 Lymphedema, not elsewhere classified I87.333 Chronic venous hypertension (idiopathic) with ulcer and inflammation of bilateral lower extremity  L97.828 Non-pressure chronic ulcer of other part of left lower leg with other specified severity L97.818 Non-pressure chronic ulcer of other part of right lower leg with other specified severity Quantity: 1 Electronic Signature(s) Signed: 11/09/2022 2:25:12 PM By: Darleen Crocker [name] ([id number]) [id number] Page 13 of 13 Signed: 11/09/2022  4:33:53 PM By: Allen Derry PA-C Previous Signature: 11/02/2022 3:19:27 PM Version By: Allen Derry PA-C Entered By: Pearletha Alfred on 11/09/2022 11:25:12  with the endoform to the main wound I think this is doing an awesome job. We are good to go ahead and see about getting some ketoconazole into the area in question at the top of his wrap where this look like there may be a little bit more of a fungal infection here to be honest. Electronic Signature(s) Signed: 11/02/2022 3:15:55 PM By: Allen Derry PA-C Entered By: Allen Derry on 11/02/2022 12:15:55 -------------------------------------------------------------------------------- Physician Orders Details Patient Name: Date of Service: [name], [name] 11/02/2022 2:45 PM Medical Record Number: [id number] Patient Account Number: [id number] Date of Birth/Sex: Treating RN: Oct 03, 1955 (67 y.o. Cline Cools Primary Care Provider: [name] Other Clinician: Referring Provider: Treating Provider/Extender: Alver Fisher in Treatment: 49 Verbal / Phone Orders: No Diagnosis Coding ICD-10 Coding Code Description I89.0 Lymphedema, not elsewhere classified I87.333 Chronic venous hypertension (idiopathic) with ulcer and inflammation of bilateral lower  extremity L97.828 Non-pressure chronic ulcer of other part of left lower leg with other specified severity L97.818 Non-pressure chronic ulcer of other part of right lower leg with other specified severity E11.621 Type 2 diabetes mellitus with foot ulcer L97.518 Non-pressure chronic ulcer of other part of right foot with other specified severity Follow-up Appointments ppointment in 1 week. Leonard Schwartz Wednesday 11/02/2022 (already scheduled) Return A ppointment in 2 weeks. Leonard Schwartz Wednesday (already scheduled) Return A Return appointment in 3 weeks. Leonard Schwartz Wednesday [facility] office to schedule) Return appointment in 1 month. Leonard Schwartz Wednesday [facility] office to schedule) Anesthetic (In clinic) Topical Lidocaine 5% applied to wound bed - Used in Clinic Prior to debridement (In clinic) Topical Lidocaine 4% applied to wound bed Cellular or Tissue Based Products Cellular or Tissue Based Product Type: - Run IVR for Kerrecis- denied #1 Apligraf applied 02/23/2022 #2 Apligraf applied 03/02/2022 #3 Apligraf applied 03/16/2022 #4 Apligraf applied 03/30/2022 HOLD APLIGRAF this week. Cellular or Tissue Based Product applied to wound bed, secured with steri-strips, cover with Adaptic or Mepitel. (DO NOT REMOVE). Bathing/ Shower/ Hygiene May shower with protection but do not get wound dressing(s) wet. Protect dressing(s) with water repellant cover (for example, large plastic bag) or a cast cover and may then take shower. Edema Control - Lymphedema / SCD / Other Elevate legs to the level of the heart or above for 30 minutes daily and/or when sitting for 3-4 times a day throughout the day. Avoid standing for long periods of time. Patient to wear own compression stockings every day. - apply in the morning and remove at night left leg. Exercise regularly Moisturize legs daily. - apply every night before bed. Compression stocking or Garment 30-40 mm/Hg pressure to: - wear your VIVE compression stocking to left leg.  apply in the morning and remove at night. Non Wound Condition CAINON, SHUBA ([id number]) [id number] Page 6 of 13 Right Lower Extremity pply the following to affected area as directed: - ketoconazole A Wound Treatment Wound #7 - Malleolus Wound Laterality: Right, Medial Cleanser: Soap and Water 1 x Per Week/30 Days Discharge Instructions: May shower and wash wound with dial antibacterial soap and water prior to dressing change. Cleanser: Vashe 5.8 (oz) 1 x Per Week/30 Days Discharge Instructions: Cleanse the wound with Vashe prior to applying a clean dressing using gauze sponges, not tissue or cotton balls. Peri-Wound Care: Sween Lotion (Moisturizing lotion) 1 x Per Week/30 Days Discharge Instructions: Apply moisturizing lotion as directed Topical: Gentamicin 1 x Per Week/30 Days Discharge Instructions: As directed by physician Prim Dressing: Endoform 2x2  in (Dispense As Written) 1 x Per Week/30 Days ary Discharge Instructions: Moisten with saline Secondary Dressing: CarboFLEX Odor Control Dressing, 4x4 in 1 x Per Week/30 Days Discharge Instructions: Apply over primary dressing as directed. Secondary Dressing: Drawtex 4x4 in 1 x Per Week/30 Days Discharge Instructions: Apply over primary dressing as directed. Secondary Dressing: Zetuvit Plus 4x8 in 1 x Per Week/30 Days Discharge Instructions: Apply over primary dressing as directed. Compression Wrap: Urgo K2, (equivalent to a 4 layer) two layer compression system, regular 1 x Per Week/30 Days Discharge Instructions: Apply Urgo K2 as directed (alternative to 4 layer compression). Patient Medications llergies: latex, ACE Inhibitors, penicillin A Notifications Medication Indication Start End 11/02/2022 ketoconazole DOSE topical 2 % cream - cream topical once daily applied to the right leg just below the knee at the site of the rash x 30 days Electronic Signature(s) Signed: 11/02/2022 3:19:02 PM By: Allen Derry PA-C Entered By: Allen Derry on 11/02/2022 12:19:01 -------------------------------------------------------------------------------- Problem List Details Patient Name: Date of Service: [name], [name] 11/02/2022 2:45 PM Medical Record Number: [id number] Patient Account Number: [id number] Date of Birth/Sex: Treating RN: 1955-10-28 (67 y.o. M) Primary Care Provider: [name] Other Clinician: Referring Provider: Treating Provider/Extender: Alver Fisher in Treatment: 95 Active Problems ICD-10 Encounter Code Description Active Date MDM Diagnosis I89.0 Lymphedema, not elsewhere classified 08/04/2021 No Yes I87.333 Chronic venous hypertension (idiopathic) with ulcer and inflammation of 08/04/2021 No Yes bilateral lower extremity PERCELL, SCHLAUD ([id number]) [id number] Page 7 of 13 [phone] Non-pressure chronic ulcer of other part of left lower leg with other specified 08/04/2021 No Yes severity L97.818 Non-pressure chronic ulcer of other part of right lower leg with other specified 08/04/2021 No Yes severity E11.621 Type 2 diabetes mellitus with foot ulcer 08/04/2021 No Yes L97.518 Non-pressure chronic ulcer of other part of right foot with other specified 08/04/2021 No Yes severity Inactive Problems Resolved Problems Electronic Signature(s) Signed: 11/02/2022 2:41:10 PM By: Allen Derry PA-C Entered By: Allen Derry on 11/02/2022 11:41:09 -------------------------------------------------------------------------------- Progress Note Details Patient Name: Date of Service: [name], [name]. 11/02/2022 2:45 PM Medical Record Number: [id number] Patient Account Number: [id number] Date of Birth/Sex: Treating RN: [birth year] (67 y.o. M) Primary Care Provider: [name] Other Clinician: Referring Provider: Treating Provider/Extender: Alver Fisher in Treatment: 24 Subjective Chief Complaint Information obtained  from Patient 08/04/2021; patient returns to clinic with bilateral leg wounds as well as areas on the right foot History of Present Illness (HPI) ADMISSION 03/22/2021 This is a 67 year old man with a past medical history significant for diabetes type 2, congestive heart failure, peripheral arterial disease, morbid obesity, venous insufficiency, and coronary artery disease. He has been followed by Dr. Loreta Ave in podiatry, who performed a transmetatarsal amputation on the left foot in August 2022. He had issues healing that wound, but based upon Dr. Kenna Gilbert notes, ultimately the TMA wound healed. During his recovery from that surgery, however, ulcers opened up over the DIP joint of the right second and third toe. These have apparently closed and reopened multiple times. It sounds like one of the issues has been moisture accumulation and maceration of the tissues causing them to reopen. At his last visit with Dr. Loreta Ave, on March 01, 2021, there continues to be problems with moisture and he was referred to wound care for further evaluation and management. He had a formal aortogram with runoff performed prior to his TMA. The findings are copied here: Patient has inline flow to both  in (Dispense As Written) 1 x Per Week/30 Days ary Discharge Instructions: Moisten with saline Secondary Dressing: CarboFLEX Odor Control Dressing, 4x4 in 1 x Per Week/30 Days Discharge Instructions: Apply over primary dressing as directed. Secondary Dressing: Drawtex 4x4 in 1 x Per Week/30 Days Discharge Instructions: Apply over primary dressing as directed. Secondary Dressing: Zetuvit Plus 4x8 in 1 x Per Week/30 Days Discharge Instructions: Apply over primary dressing as directed. Compression Wrap: Urgo K2, (equivalent to a 4 layer) two layer compression system, regular 1 x Per Week/30 Days Discharge Instructions: Apply Urgo K2 as directed (alternative to 4 layer compression). Patient Medications llergies: latex, ACE Inhibitors, penicillin A Notifications Medication Indication Start End 11/02/2022 ketoconazole DOSE topical 2 % cream - cream topical once daily applied to the right leg just below the knee at the site of the rash x 30 days Electronic Signature(s) Signed: 11/02/2022 3:19:02 PM By: Allen Derry PA-C Entered By: Allen Derry on 11/02/2022 12:19:01 -------------------------------------------------------------------------------- Problem List Details Patient Name: Date of Service: [name], [name] 11/02/2022 2:45 PM Medical Record Number: [id number] Patient Account Number: [id number] Date of Birth/Sex: Treating RN: 1955-10-28 (67 y.o. M) Primary Care Provider: [name] Other Clinician: Referring Provider: Treating Provider/Extender: Alver Fisher in Treatment: 95 Active Problems ICD-10 Encounter Code Description Active Date MDM Diagnosis I89.0 Lymphedema, not elsewhere classified 08/04/2021 No Yes I87.333 Chronic venous hypertension (idiopathic) with ulcer and inflammation of 08/04/2021 No Yes bilateral lower extremity PERCELL, SCHLAUD ([id number]) [id number] Page 7 of 13 [phone] Non-pressure chronic ulcer of other part of left lower leg with other specified 08/04/2021 No Yes severity L97.818 Non-pressure chronic ulcer of other part of right lower leg with other specified 08/04/2021 No Yes severity E11.621 Type 2 diabetes mellitus with foot ulcer 08/04/2021 No Yes L97.518 Non-pressure chronic ulcer of other part of right foot with other specified 08/04/2021 No Yes severity Inactive Problems Resolved Problems Electronic Signature(s) Signed: 11/02/2022 2:41:10 PM By: Allen Derry PA-C Entered By: Allen Derry on 11/02/2022 11:41:09 -------------------------------------------------------------------------------- Progress Note Details Patient Name: Date of Service: [name], [name]. 11/02/2022 2:45 PM Medical Record Number: [id number] Patient Account Number: [id number] Date of Birth/Sex: Treating RN: [birth year] (67 y.o. M) Primary Care Provider: [name] Other Clinician: Referring Provider: Treating Provider/Extender: Alver Fisher in Treatment: 24 Subjective Chief Complaint Information obtained  from Patient 08/04/2021; patient returns to clinic with bilateral leg wounds as well as areas on the right foot History of Present Illness (HPI) ADMISSION 03/22/2021 This is a 67 year old man with a past medical history significant for diabetes type 2, congestive heart failure, peripheral arterial disease, morbid obesity, venous insufficiency, and coronary artery disease. He has been followed by Dr. Loreta Ave in podiatry, who performed a transmetatarsal amputation on the left foot in August 2022. He had issues healing that wound, but based upon Dr. Kenna Gilbert notes, ultimately the TMA wound healed. During his recovery from that surgery, however, ulcers opened up over the DIP joint of the right second and third toe. These have apparently closed and reopened multiple times. It sounds like one of the issues has been moisture accumulation and maceration of the tissues causing them to reopen. At his last visit with Dr. Loreta Ave, on March 01, 2021, there continues to be problems with moisture and he was referred to wound care for further evaluation and management. He had a formal aortogram with runoff performed prior to his TMA. The findings are copied here: Patient has inline flow to both  [name], [name] ([id number]) [id number] Page 1 of 13 Visit Report for 11/02/2022 Chief Complaint Document Details Patient Name: Date of Service: [name], [name] 11/02/2022 2:45 PM Medical Record Number: [id number] Patient Account Number: [id number] Date of Birth/Sex: Treating RN: Jul 14, 1955 (67 y.o. M) Primary Care Provider: [name] Other Clinician: Referring Provider: Treating Provider/Extender: Alver Fisher in Treatment: 10 Information Obtained from: Patient Chief Complaint 08/04/2021; patient returns to clinic with bilateral leg wounds as well as areas on the right foot Electronic Signature(s) Signed: 11/02/2022 2:41:17 PM By: Allen Derry PA-C Entered By: Allen Derry on 11/02/2022 11:41:17 -------------------------------------------------------------------------------- HPI Details Patient Name: Date of Service: [name], [name]. 11/02/2022 2:45 PM Medical Record Number: [id number] Patient Account Number: [id number] Date of Birth/Sex: Treating RN: 1955/01/21 (67 y.o. M) Primary Care Provider: [name] Other Clinician: Referring Provider: Treating Provider/Extender: Alver Fisher in Treatment: 63 History of Present Illness HPI Description: ADMISSION 03/22/2021 This is a 67 year old man with a past medical history significant for diabetes type 2, congestive heart failure, peripheral arterial disease, morbid obesity, venous insufficiency, and coronary artery disease. He has been followed by Dr. Loreta Ave in podiatry, who performed a transmetatarsal amputation on the left foot in August 2022. He had issues healing that wound, but based upon Dr. Kenna Gilbert notes, ultimately the TMA wound healed. During his recovery from that surgery, however, ulcers opened up over the DIP joint of the right second and third toe. These have apparently closed and reopened multiple times. It sounds like one of the issues has been  moisture accumulation and maceration of the tissues causing them to reopen. At his last visit with Dr. Loreta Ave, on March 01, 2021, there continues to be problems with moisture and he was referred to wound care for further evaluation and management. He had a formal aortogram with runoff performed prior to his TMA. The findings are copied here: Patient has inline flow to both feet with no significant flow-limiting lesion that would be amenable to percutaneous or open revascularization. He does have an element of small vessel disease and has a short segment occlusion of the distal anterior tibial/dorsalis pedis artery on the left foot but does have posterior tibial artery flow. Would recommend management of wounds with amputation of toes 2 and 3 on the right foot if the wounds do not heal and deteriorate. Transmetatarsal amputation on the left side has as good a blood supply as it is going to get and hopefully this will heal in the future. Formal ABIs were done in January 2023. They are normal bilaterally. ABI Findings: +---------+------------------+-----+----------+--------+ Right Rt Pressure (mmHg)IndexWaveform Comment  +---------+------------------+-----+----------+--------+ Brachial 160     +---------+------------------+-----+----------+--------+ PTA 192 1.06 monophasic  +---------+------------------+-----+----------+--------+ DP 159 0.88 monophasic  +---------+------------------+-----+----------+--------+ Great T oe145 0.80    +---------+------------------+-----+----------+--------+ +--------+------------------+-----+---------+-------+ [name] ([id number]) [id number] Page 2 of 13 Left Lt Pressure (mmHg)IndexWaveform Comment +--------+------------------+-----+---------+-------+ [id number]     +--------+------------------+-----+---------+-------+ PTA 204 1.13 triphasic   +--------+------------------+-----+---------+-------+ DP 194 1.07 biphasic   +--------+------------------+-----+---------+-------+ +-------+-----------+-----------+------------+------------+ ABI/TBIT oday's ABIT oday's TBIPrevious ABIPrevious TBI +-------+-----------+-----------+------------+------------+ Right 1.06 0.8 1.26 0.65  +-------+-----------+-----------+------------+------------+ Left 1.13 amputation 1.15 amputation  +-------+-----------+-----------+------------+------------+ Previous ABI on 08/06/20 at [facility] Pedal pressures falsely elevated due to medial calcification. Summary: Right: Resting right ankle-brachial index is within normal range. The right toe-brachial index is normal. Left: Resting left ankle-brachial index is within normal range. READMISSION 08/04/2021 Mr. Null is now a 67 year old man who I remember from this clinic many years ago I  in (Dispense As Written) 1 x Per Week/30 Days ary Discharge Instructions: Moisten with saline Secondary Dressing: CarboFLEX Odor Control Dressing, 4x4 in 1 x Per Week/30 Days Discharge Instructions: Apply over primary dressing as directed. Secondary Dressing: Drawtex 4x4 in 1 x Per Week/30 Days Discharge Instructions: Apply over primary dressing as directed. Secondary Dressing: Zetuvit Plus 4x8 in 1 x Per Week/30 Days Discharge Instructions: Apply over primary dressing as directed. Compression Wrap: Urgo K2, (equivalent to a 4 layer) two layer compression system, regular 1 x Per Week/30 Days Discharge Instructions: Apply Urgo K2 as directed (alternative to 4 layer compression). Patient Medications llergies: latex, ACE Inhibitors, penicillin A Notifications Medication Indication Start End 11/02/2022 ketoconazole DOSE topical 2 % cream - cream topical once daily applied to the right leg just below the knee at the site of the rash x 30 days Electronic Signature(s) Signed: 11/02/2022 3:19:02 PM By: Allen Derry PA-C Entered By: Allen Derry on 11/02/2022 12:19:01 -------------------------------------------------------------------------------- Problem List Details Patient Name: Date of Service: [name], [name] 11/02/2022 2:45 PM Medical Record Number: [id number] Patient Account Number: [id number] Date of Birth/Sex: Treating RN: 1955-10-28 (67 y.o. M) Primary Care Provider: [name] Other Clinician: Referring Provider: Treating Provider/Extender: Alver Fisher in Treatment: 95 Active Problems ICD-10 Encounter Code Description Active Date MDM Diagnosis I89.0 Lymphedema, not elsewhere classified 08/04/2021 No Yes I87.333 Chronic venous hypertension (idiopathic) with ulcer and inflammation of 08/04/2021 No Yes bilateral lower extremity PERCELL, SCHLAUD ([id number]) [id number] Page 7 of 13 [phone] Non-pressure chronic ulcer of other part of left lower leg with other specified 08/04/2021 No Yes severity L97.818 Non-pressure chronic ulcer of other part of right lower leg with other specified 08/04/2021 No Yes severity E11.621 Type 2 diabetes mellitus with foot ulcer 08/04/2021 No Yes L97.518 Non-pressure chronic ulcer of other part of right foot with other specified 08/04/2021 No Yes severity Inactive Problems Resolved Problems Electronic Signature(s) Signed: 11/02/2022 2:41:10 PM By: Allen Derry PA-C Entered By: Allen Derry on 11/02/2022 11:41:09 -------------------------------------------------------------------------------- Progress Note Details Patient Name: Date of Service: [name], [name]. 11/02/2022 2:45 PM Medical Record Number: [id number] Patient Account Number: [id number] Date of Birth/Sex: Treating RN: [birth year] (67 y.o. M) Primary Care Provider: [name] Other Clinician: Referring Provider: Treating Provider/Extender: Alver Fisher in Treatment: 24 Subjective Chief Complaint Information obtained  from Patient 08/04/2021; patient returns to clinic with bilateral leg wounds as well as areas on the right foot History of Present Illness (HPI) ADMISSION 03/22/2021 This is a 67 year old man with a past medical history significant for diabetes type 2, congestive heart failure, peripheral arterial disease, morbid obesity, venous insufficiency, and coronary artery disease. He has been followed by Dr. Loreta Ave in podiatry, who performed a transmetatarsal amputation on the left foot in August 2022. He had issues healing that wound, but based upon Dr. Kenna Gilbert notes, ultimately the TMA wound healed. During his recovery from that surgery, however, ulcers opened up over the DIP joint of the right second and third toe. These have apparently closed and reopened multiple times. It sounds like one of the issues has been moisture accumulation and maceration of the tissues causing them to reopen. At his last visit with Dr. Loreta Ave, on March 01, 2021, there continues to be problems with moisture and he was referred to wound care for further evaluation and management. He had a formal aortogram with runoff performed prior to his TMA. The findings are copied here: Patient has inline flow to both  in (Dispense As Written) 1 x Per Week/30 Days ary Discharge Instructions: Moisten with saline Secondary Dressing: CarboFLEX Odor Control Dressing, 4x4 in 1 x Per Week/30 Days Discharge Instructions: Apply over primary dressing as directed. Secondary Dressing: Drawtex 4x4 in 1 x Per Week/30 Days Discharge Instructions: Apply over primary dressing as directed. Secondary Dressing: Zetuvit Plus 4x8 in 1 x Per Week/30 Days Discharge Instructions: Apply over primary dressing as directed. Compression Wrap: Urgo K2, (equivalent to a 4 layer) two layer compression system, regular 1 x Per Week/30 Days Discharge Instructions: Apply Urgo K2 as directed (alternative to 4 layer compression). Patient Medications llergies: latex, ACE Inhibitors, penicillin A Notifications Medication Indication Start End 11/02/2022 ketoconazole DOSE topical 2 % cream - cream topical once daily applied to the right leg just below the knee at the site of the rash x 30 days Electronic Signature(s) Signed: 11/02/2022 3:19:02 PM By: Allen Derry PA-C Entered By: Allen Derry on 11/02/2022 12:19:01 -------------------------------------------------------------------------------- Problem List Details Patient Name: Date of Service: [name], [name] 11/02/2022 2:45 PM Medical Record Number: [id number] Patient Account Number: [id number] Date of Birth/Sex: Treating RN: 1955-10-28 (67 y.o. M) Primary Care Provider: [name] Other Clinician: Referring Provider: Treating Provider/Extender: Alver Fisher in Treatment: 95 Active Problems ICD-10 Encounter Code Description Active Date MDM Diagnosis I89.0 Lymphedema, not elsewhere classified 08/04/2021 No Yes I87.333 Chronic venous hypertension (idiopathic) with ulcer and inflammation of 08/04/2021 No Yes bilateral lower extremity PERCELL, SCHLAUD ([id number]) [id number] Page 7 of 13 [phone] Non-pressure chronic ulcer of other part of left lower leg with other specified 08/04/2021 No Yes severity L97.818 Non-pressure chronic ulcer of other part of right lower leg with other specified 08/04/2021 No Yes severity E11.621 Type 2 diabetes mellitus with foot ulcer 08/04/2021 No Yes L97.518 Non-pressure chronic ulcer of other part of right foot with other specified 08/04/2021 No Yes severity Inactive Problems Resolved Problems Electronic Signature(s) Signed: 11/02/2022 2:41:10 PM By: Allen Derry PA-C Entered By: Allen Derry on 11/02/2022 11:41:09 -------------------------------------------------------------------------------- Progress Note Details Patient Name: Date of Service: [name], [name]. 11/02/2022 2:45 PM Medical Record Number: [id number] Patient Account Number: [id number] Date of Birth/Sex: Treating RN: [birth year] (67 y.o. M) Primary Care Provider: [name] Other Clinician: Referring Provider: Treating Provider/Extender: Alver Fisher in Treatment: 24 Subjective Chief Complaint Information obtained  from Patient 08/04/2021; patient returns to clinic with bilateral leg wounds as well as areas on the right foot History of Present Illness (HPI) ADMISSION 03/22/2021 This is a 67 year old man with a past medical history significant for diabetes type 2, congestive heart failure, peripheral arterial disease, morbid obesity, venous insufficiency, and coronary artery disease. He has been followed by Dr. Loreta Ave in podiatry, who performed a transmetatarsal amputation on the left foot in August 2022. He had issues healing that wound, but based upon Dr. Kenna Gilbert notes, ultimately the TMA wound healed. During his recovery from that surgery, however, ulcers opened up over the DIP joint of the right second and third toe. These have apparently closed and reopened multiple times. It sounds like one of the issues has been moisture accumulation and maceration of the tissues causing them to reopen. At his last visit with Dr. Loreta Ave, on March 01, 2021, there continues to be problems with moisture and he was referred to wound care for further evaluation and management. He had a formal aortogram with runoff performed prior to his TMA. The findings are copied here: Patient has inline flow to both  with the endoform to the main wound I think this is doing an awesome job. We are good to go ahead and see about getting some ketoconazole into the area in question at the top of his wrap where this look like there may be a little bit more of a fungal infection here to be honest. Electronic Signature(s) Signed: 11/02/2022 3:15:55 PM By: Allen Derry PA-C Entered By: Allen Derry on 11/02/2022 12:15:55 -------------------------------------------------------------------------------- Physician Orders Details Patient Name: Date of Service: [name], [name] 11/02/2022 2:45 PM Medical Record Number: [id number] Patient Account Number: [id number] Date of Birth/Sex: Treating RN: Oct 03, 1955 (67 y.o. Cline Cools Primary Care Provider: [name] Other Clinician: Referring Provider: Treating Provider/Extender: Alver Fisher in Treatment: 49 Verbal / Phone Orders: No Diagnosis Coding ICD-10 Coding Code Description I89.0 Lymphedema, not elsewhere classified I87.333 Chronic venous hypertension (idiopathic) with ulcer and inflammation of bilateral lower  extremity L97.828 Non-pressure chronic ulcer of other part of left lower leg with other specified severity L97.818 Non-pressure chronic ulcer of other part of right lower leg with other specified severity E11.621 Type 2 diabetes mellitus with foot ulcer L97.518 Non-pressure chronic ulcer of other part of right foot with other specified severity Follow-up Appointments ppointment in 1 week. Leonard Schwartz Wednesday 11/02/2022 (already scheduled) Return A ppointment in 2 weeks. Leonard Schwartz Wednesday (already scheduled) Return A Return appointment in 3 weeks. Leonard Schwartz Wednesday [facility] office to schedule) Return appointment in 1 month. Leonard Schwartz Wednesday [facility] office to schedule) Anesthetic (In clinic) Topical Lidocaine 5% applied to wound bed - Used in Clinic Prior to debridement (In clinic) Topical Lidocaine 4% applied to wound bed Cellular or Tissue Based Products Cellular or Tissue Based Product Type: - Run IVR for Kerrecis- denied #1 Apligraf applied 02/23/2022 #2 Apligraf applied 03/02/2022 #3 Apligraf applied 03/16/2022 #4 Apligraf applied 03/30/2022 HOLD APLIGRAF this week. Cellular or Tissue Based Product applied to wound bed, secured with steri-strips, cover with Adaptic or Mepitel. (DO NOT REMOVE). Bathing/ Shower/ Hygiene May shower with protection but do not get wound dressing(s) wet. Protect dressing(s) with water repellant cover (for example, large plastic bag) or a cast cover and may then take shower. Edema Control - Lymphedema / SCD / Other Elevate legs to the level of the heart or above for 30 minutes daily and/or when sitting for 3-4 times a day throughout the day. Avoid standing for long periods of time. Patient to wear own compression stockings every day. - apply in the morning and remove at night left leg. Exercise regularly Moisturize legs daily. - apply every night before bed. Compression stocking or Garment 30-40 mm/Hg pressure to: - wear your VIVE compression stocking to left leg.  apply in the morning and remove at night. Non Wound Condition CAINON, SHUBA ([id number]) [id number] Page 6 of 13 Right Lower Extremity pply the following to affected area as directed: - ketoconazole A Wound Treatment Wound #7 - Malleolus Wound Laterality: Right, Medial Cleanser: Soap and Water 1 x Per Week/30 Days Discharge Instructions: May shower and wash wound with dial antibacterial soap and water prior to dressing change. Cleanser: Vashe 5.8 (oz) 1 x Per Week/30 Days Discharge Instructions: Cleanse the wound with Vashe prior to applying a clean dressing using gauze sponges, not tissue or cotton balls. Peri-Wound Care: Sween Lotion (Moisturizing lotion) 1 x Per Week/30 Days Discharge Instructions: Apply moisturizing lotion as directed Topical: Gentamicin 1 x Per Week/30 Days Discharge Instructions: As directed by physician Prim Dressing: Endoform 2x2  11/02/2022 3:19:13 PM Version By: Allen Derry PA-C Entered By: Shawn Stall on 11/04/2022 10:08:06 -------------------------------------------------------------------------------- SuperBill Details Patient Name: Date of Service: [name], [name]. 11/02/2022 Medical Record Number: [id number] Patient Account Number:  [id number] Date of Birth/Sex: Treating RN: Nov 03, 1955 (67 y.o. M) Primary Care Provider: [name] Other Clinician: Referring Provider: Treating Provider/Extender: Alver Fisher in Treatment: 65 Diagnosis Coding ICD-10 Codes Code Description I89.0 Lymphedema, not elsewhere classified I87.333 Chronic venous hypertension (idiopathic) with ulcer and inflammation of bilateral lower extremity L97.828 Non-pressure chronic ulcer of other part of left lower leg with other specified severity L97.818 Non-pressure chronic ulcer of other part of right lower leg with other specified severity E11.621 Type 2 diabetes mellitus with foot ulcer L97.518 Non-pressure chronic ulcer of other part of right foot with other specified severity Facility Procedures : 3 CPT4 Code: 4742595 Description: (Facility Use Only) [phone] - APPLY MULTLAY COMPRS LWR LT LEG ICD-10 Diagnosis Description I89.0 Lymphedema, not elsewhere classified I87.333 Chronic venous hypertension (idiopathic) with ulcer and inflammation of bilateral lower L97.828  Non-pressure chronic ulcer of other part of left lower leg with other specified severit L97.818 Non-pressure chronic ulcer of other part of right lower leg with other specified severi Modifier: extremity y ty Quantity: 1 Physician Procedures : CPT4 Code Description Modifier [id number] - WC PHYS LEVEL 4 - EST PT ICD-10 Diagnosis Description I89.0 Lymphedema, not elsewhere classified I87.333 Chronic venous hypertension (idiopathic) with ulcer and inflammation of bilateral lower extremity  L97.828 Non-pressure chronic ulcer of other part of left lower leg with other specified severity L97.818 Non-pressure chronic ulcer of other part of right lower leg with other specified severity Quantity: 1 Electronic Signature(s) Signed: 11/09/2022 2:25:12 PM By: Darleen Crocker [name] ([id number]) [id number] Page 13 of 13 Signed: 11/09/2022  4:33:53 PM By: Allen Derry PA-C Previous Signature: 11/02/2022 3:19:27 PM Version By: Allen Derry PA-C Entered By: Pearletha Alfred on 11/09/2022 11:25:12  with where we stand today. Fortunately I do not see any signs of active infection which is great news. 09-14-2022 upon evaluation today patient appears to be doing well current regard to his wound with section showing signs of improvement would be using endoform which I think is really doing a great job here. Fortunately I do not see any signs of worsening overall. 09-21-2022 upon evaluation today patient appears to be doing well currently in regard to his wound. He has been tolerating the dressing changes without complication. Fortunately there does not appear to be any signs of active infection at this time which is good news. 09-28-2022 upon evaluation today patient appears to be doing well currently in regard to his wound which is actually measuring quite a bit smaller. This looks to be doing well and very pleased with where things stand today I do not see any signs of active infection locally or systemically which is great news. No  fevers, chills, nausea, vomiting, or diarrhea. 10-05-2022 upon evaluation today patient appears to be doing excellent in regard to his wounds. He has been tolerating the dressing changes without complication and in general I do believe that we will make an really good headway towards complete closure. 10/12/2022 upon evaluation today patient's wound is actually showing signs of excellent improvement. I am actually very pleased with where we stand I think that he is moving in the right direction and this is excellent news. I do not see any evidence of worsening overall 10-19-2022 upon evaluation today patient appears to be doing well currently in regard to his wound. He is showing signs of improvement and very pleased with where we stand I think that is making good progress here. 10-26-2022 upon evaluation today patient appears to be doing well currently in regard to his wound. In fact this is showing signs of excellent improvement I am actually very pleased with where we stand I do believe that he is making excellent headway towards complete closure which is great news as well. No fevers, chills, nausea, vomiting, or diarrhea. 11-02-2022 upon evaluation today patient appears to be doing great in regard to his wound although he does have an area at the top of the wrap which sometimes gets moist which to be honest he actually appears to have a little bit of a fungal infection at this point. I think that we may need to treat this he can get to it and put some cream in there he actually does that on his own UA with some CeraVe. Electronic Signature(s) Signed: 11/02/2022 3:15:25 PM By: Allen Derry PA-C Entered By: Allen Derry on 11/02/2022 12:15:25 -------------------------------------------------------------------------------- Physical Exam Details Patient Name: Date of Service: [name], [name]. 11/02/2022 2:45 PM Medical Record Number: [id number] Patient Account Number: [id number] Date of Birth/Sex:  Treating RN: November 08, 1955 (67 y.o. M) Primary Care Provider: [name] Other Clinician: Referring Provider: Treating Provider/Extender: Alver Fisher in Treatment: 45 Constitutional Well-nourished and well-hydrated in no acute distress. Respiratory KASTON, SPATA ([id number]) [id number] Page 5 of 13 normal breathing without difficulty. Psychiatric this patient is able to make decisions and demonstrates good insight into disease process. Alert and Oriented x 3. pleasant and cooperative. Notes Upon inspection patient's wound bed actually showed signs of excellent improvement at this point and I overall feel like that he is really making good headway here towards closure I am pleased with what we are seeing I would recommend that we have the patient go ahead and continue

## 2022-11-04 NOTE — Progress Notes (Signed)
[id number] Date of Birth/Sex: Treating RN: [name] (67 y.o. [name] Primary Care [name]: [name] Other Clinician: Referring [name]: Treating [name]/Extender: [name] in Treatment: 57 Multidisciplinary Care Plan reviewed with physician Active Inactive Pain, Acute or Chronic Nursing Diagnoses: Pain, acute or chronic: actual or potential Potential alteration in comfort, pain Goals: Patient will verbalize adequate pain control and receive pain control interventions during procedures as needed Date Initiated: 08/04/2021 Target Resolution Date: 01/10/2023 Goal Status: Active Patient/caregiver will verbalize comfort level met Date Initiated: 08/04/2021 Date Inactivated: 07/27/2022 Target Resolution Date: 09/10/2022 Goal Status: Met Interventions: Encourage patient to take pain medications as prescribed Provide education on pain management Reposition patient for comfort Treatment Activities: Administer pain control measures as ordered : 08/04/2021 Notes: Electronic Signature(s) Signed: 11/03/2022 5:52:17 PM By: [name] Pulling RN, BSN Entered By: [name] on 11/02/2022 12:13:07 -------------------------------------------------------------------------------- Pain Assessment Details Patient Name: Date of Service: [name]. 11/02/2022 2:45 PM Medical Record Number: [id number] Patient Account Number: [id number] Date of Birth/Sex: Treating RN: [name] (67 y.o. M) Primary Care [name]: [name] Other Clinician: Referring [name]: Treating [name]/Extender: [name] in Treatment: 25 Active Problems Location of Pain Severity and Description of Pain Patient Has Paino No Site Locations [place], [place] [name] ([id number]) [id number] Page 4 of 6 Pain Management and Medication Current Pain Management: Electronic Signature(s) Signed: 11/03/2022 4:59:00 PM By: [name] Entered By: [name] on 11/02/2022 11:44:18 -------------------------------------------------------------------------------- Patient/Caregiver Education Details Patient Name: Date of Service: [name] 10/23/2024andnbsp2:45 PM Medical Record Number: [id number] Patient Account Number: [id number] Date of Birth/Gender: Treating RN: [name] (67 y.o. [name] Primary Care Physician: [name] Other Clinician: Referring Physician: Treating Physician/Extender: [name] in Treatment: 53 Education Assessment Education Provided To: Patient Education Topics Provided Wound/Skin Impairment: Methods: Explain/Verbal Responses: State content correctly [name]) Signed: 11/03/2022 5:52:17 PM By: [name] Pulling RN, BSN Entered By: [name] on 11/02/2022 12:13:25 -------------------------------------------------------------------------------- Wound Assessment Details Patient Name: Date of Service: [name], [name]. 11/02/2022 2:45 PM Medical Record Number: [id number] Patient Account Number: [id number] Date of Birth/Sex: Treating RN: [name] (67  y.o. M) Primary Care [name]: [name] Other Clinician: Referring [name]: Treating [name]/Extender: [name] ([id number]) [id number] Page 5 of 6 Weeks in Treatment: 65 Wound Status Wound Number: 7 Primary Venous Leg Ulcer Etiology: Wound Location: Right, Medial Malleolus Secondary Lymphedema Wounding Event: Blister Etiology: Date Acquired: 07/21/2021 Wound Open Weeks Of Treatment: 65 Status: Clustered Wound: No Comorbid Sleep Apnea, Arrhythmia, Congestive Heart Failure, History: Hypertension, Hypotension, Peripheral Arterial Disease, Peripheral Venous Disease, Type II Diabetes, Gout, Osteoarthritis Photos Wound Measurements Length: (cm) Width: (cm) Depth: (cm) Area: (cm) Volume: (cm) 3 % Reduction in Area: 93.2% 1.1 % Reduction in Volume: 98.1% 0.2 Epithelialization: Medium (34-66%) 2.592 Tunneling: No 0.518 Undermining: No Wound Description Classification: Full Thickness With Exposed Supp Wound Margin: Distinct, outline attached Exudate Amount: Medium Exudate Type: Serosanguineous Exudate Color: red, brown ort Structures Foul Odor After Cleansing: No Slough/Fibrino Yes Wound Bed Granulation Amount: Large (67-100%) Exposed Structure Granulation Quality: Red Fascia Exposed: No Necrotic Amount: Small (1-33%) Fat Layer (Subcutaneous Tissue) Exposed: Yes Necrotic Quality: Adherent Slough Tendon Exposed: No Muscle Exposed: No Joint Exposed: No Bone Exposed: No Periwound Skin Texture Texture Color No Abnormalities Noted: No No Abnormalities Noted: No Callus: Yes Atrophie Blanche: No Crepitus: No Cyanosis: No Excoriation: No Ecchymosis: No Induration: No Erythema: No Rash: No Hemosiderin Staining:  [name] ([id number]) [id number] Page 1 of 6 Visit Report for 11/02/2022 Arrival Information Details Patient Name: Date of Service: [name], [name] 11/02/2022 2:45 PM Medical Record Number: [id number] Patient Account Number: [id number] Date of Birth/Sex: Treating RN: [name] (67 y.o. M) Primary Care [name]: [name] Other Clinician: Referring [name]: Treating [name]: [name] in Treatment: 59 Visit Information History Since Last Visit Added or deleted any medications: No Patient Arrived: [name] Any new allergies or adverse reactions: No Arrival Time: 14:43 Had a fall or experienced change in No Accompanied By: self activities of daily living that may affect Transfer Assistance: None risk of falls: Patient Identification Verified: Yes Signs or symptoms of abuse/neglect since last visito No Secondary Verification Process Completed: Yes Hospitalized since last visit: No Patient Requires Transmission-Based Precautions: No Implantable device outside of the clinic excluding No Patient Has Alerts: Yes cellular tissue based products placed in the center Patient Alerts: Patient on Blood Thinner since last visit: 01/2021 ABI L 1.13 R 1.06 Has Dressing in Place as Prescribed: Yes 01/2021 TBI L amp R 0.8 Has Compression in Place as Prescribed: Yes Pain Present Now: No Electronic Signature(s) Signed: 11/03/2022 4:59:00 PM By: [name] Entered By: [name] on 11/02/2022 11:43:41 -------------------------------------------------------------------------------- Compression Therapy Details Patient Name: Date of Service: [name]. 11/02/2022 2:45 PM Medical Record Number: [id number] Patient Account Number: [id number] Date of Birth/Sex: Treating RN: [birth year] [name] (67 y.o. [name] Primary Care Ameia Morency: [name] Other Clinician: Referring Keonia Pasko: Treating Taneia Mealor/Extender: [name] in Treatment: 65 Compression Therapy Performed for Wound Assessment: Wound #7 Right,Medial Malleolus Performed By: Timmothy Sours, RN Compression Type: Four Layer Post Procedure Diagnosis Same as Pre-procedure Electronic Signature(s) Signed: 11/03/2022 5:52:17 PM By: [name] Pulling RN, BSN Entered By: [name] on 11/02/2022 13:56:00 Eveland, Dannielle Huh [name] ([id number]) [id number] Page 2 of 6 -------------------------------------------------------------------------------- Encounter Discharge Information Details Patient Name: Date of Service: [name], [name] 11/02/2022 2:45 PM Medical Record Number: [id number] Patient Account Number: [id number] Date of Birth/Sex: Treating RN: [birth year] [name] (67 y.o. [name] Primary Care Caelynn Marshman: [name] Other Clinician: Referring Rosselyn [name]: Treating Shallyn Constancio/Extender: [name] in Treatment: 60 Encounter Discharge Information Items Discharge Condition: Stable Ambulatory Status: Ambulatory Discharge Destination: Home Transportation: Private Auto Accompanied By: self Schedule Follow-up Appointment: Yes Clinical Summary of Care: Patient Declined Electronic Signature(s) Signed: 11/03/2022 5:52:17 PM By: [name] Pulling RN, BSN Entered By: [name] on 11/02/2022 13:56:49 -------------------------------------------------------------------------------- Lower Extremity Assessment Details Patient Name: Date of Service: [name], [name] Grooms [name]. 11/02/2022 2:45 PM Medical Record Number: [id number] Patient Account Number: [id number] Date of Birth/Sex: Treating RN: [name] (67 y.o. M) Primary Care Rachell Druckenmiller: [name] Other Clinician: Referring Nelson Julson: Treating Annjeanette Sarwar/Extender: [name] in Treatment: 65 Edema Assessment Assessed: [Left: No] [Right: No] Edema: [Left: N] [Right: o] Calf Left: Right: Point of Measurement: 31 cm  From Medial Instep 39 cm Ankle Left: Right: Point of Measurement: 11 cm From Medial Instep 25.4 cm Vascular Assessment Extremity colors, hair growth, and conditions: Extremity Color: [Right:Normal] Hair Growth on Extremity: [Right:No] Temperature of Extremity: [Right:Warm] Capillary Refill: [Right:< 3 seconds] Dependent Rubor: [Right:No Yes] Electronic Signature(s) Signed: 11/03/2022 4:59:00 PM By: [name] Entered By: [name] on 11/02/2022 11:51:20 Vorndran, Hasten [name] ([id number]) [id number] Page 3 of 6 -------------------------------------------------------------------------------- Multi-Disciplinary Care Plan Details Patient Name: Date of Service: [name], [name] 11/02/2022 2:45 PM Medical Record Number: [id number] Patient Account Number:  [id number] Date of Birth/Sex: Treating RN: [name] (67 y.o. [name] Primary Care [name]: [name] Other Clinician: Referring [name]: Treating [name]/Extender: [name] in Treatment: 57 Multidisciplinary Care Plan reviewed with physician Active Inactive Pain, Acute or Chronic Nursing Diagnoses: Pain, acute or chronic: actual or potential Potential alteration in comfort, pain Goals: Patient will verbalize adequate pain control and receive pain control interventions during procedures as needed Date Initiated: 08/04/2021 Target Resolution Date: 01/10/2023 Goal Status: Active Patient/caregiver will verbalize comfort level met Date Initiated: 08/04/2021 Date Inactivated: 07/27/2022 Target Resolution Date: 09/10/2022 Goal Status: Met Interventions: Encourage patient to take pain medications as prescribed Provide education on pain management Reposition patient for comfort Treatment Activities: Administer pain control measures as ordered : 08/04/2021 Notes: Electronic Signature(s) Signed: 11/03/2022 5:52:17 PM By: [name] Pulling RN, BSN Entered By: [name] on 11/02/2022 12:13:07 -------------------------------------------------------------------------------- Pain Assessment Details Patient Name: Date of Service: [name]. 11/02/2022 2:45 PM Medical Record Number: [id number] Patient Account Number: [id number] Date of Birth/Sex: Treating RN: [name][birth year] (67 y.o. M) Primary Care [name]: [name] Other Clinician: Referring [name]: Treating [name]/Extender: [name] in Treatment: 25 Active Problems Location of Pain Severity and Description of Pain Patient Has Paino No Site Locations [place], [place] [name] ([id number]) [id number] Page 4 of 6 Pain Management and Medication Current Pain Management: Electronic Signature(s) Signed: 11/03/2022 4:59:00 PM By: [name] Entered By: [name] on 11/02/2022 11:44:18 -------------------------------------------------------------------------------- Patient/Caregiver Education Details Patient Name: Date of Service: [name] 10/23/2024andnbsp2:45 PM Medical Record Number: [id number] Patient Account Number: [id number] Date of Birth/Gender: Treating RN: [name] (67 y.o. [name] Primary Care Physician: [name] Other Clinician: Referring Physician: Treating Physician/Extender: [name] in Treatment: 53 Education Assessment Education Provided To: Patient Education Topics Provided Wound/Skin Impairment: Methods: Explain/Verbal Responses: State content correctly [name]) Signed: 11/03/2022 5:52:17 PM By: [name] Pulling RN, BSN Entered By: [name] on 11/02/2022 12:13:25 -------------------------------------------------------------------------------- Wound Assessment Details Patient Name: Date of Service: [name], [name]. 11/02/2022 2:45 PM Medical Record Number: [id number] Patient Account Number: [id number] Date of Birth/Sex: Treating RN: [name] (67  y.o. M) Primary Care [name]: [name] Other Clinician: Referring [name]: Treating [name]/Extender: [name] ([id number]) [id number] Page 5 of 6 Weeks in Treatment: 65 Wound Status Wound Number: 7 Primary Venous Leg Ulcer Etiology: Wound Location: Right, Medial Malleolus Secondary Lymphedema Wounding Event: Blister Etiology: Date Acquired: 07/21/2021 Wound Open Weeks Of Treatment: 65 Status: Clustered Wound: No Comorbid Sleep Apnea, Arrhythmia, Congestive Heart Failure, History: Hypertension, Hypotension, Peripheral Arterial Disease, Peripheral Venous Disease, Type II Diabetes, Gout, Osteoarthritis Photos Wound Measurements Length: (cm) Width: (cm) Depth: (cm) Area: (cm) Volume: (cm) 3 % Reduction in Area: 93.2% 1.1 % Reduction in Volume: 98.1% 0.2 Epithelialization: Medium (34-66%) 2.592 Tunneling: No 0.518 Undermining: No Wound Description Classification: Full Thickness With Exposed Supp Wound Margin: Distinct, outline attached Exudate Amount: Medium Exudate Type: Serosanguineous Exudate Color: red, brown ort Structures Foul Odor After Cleansing: No Slough/Fibrino Yes Wound Bed Granulation Amount: Large (67-100%) Exposed Structure Granulation Quality: Red Fascia Exposed: No Necrotic Amount: Small (1-33%) Fat Layer (Subcutaneous Tissue) Exposed: Yes Necrotic Quality: Adherent Slough Tendon Exposed: No Muscle Exposed: No Joint Exposed: No Bone Exposed: No Periwound Skin Texture Texture Color No Abnormalities Noted: No No Abnormalities Noted: No Callus: Yes Atrophie Blanche: No Crepitus: No Cyanosis: No Excoriation: No Ecchymosis: No Induration: No Erythema: No Rash: No Hemosiderin Staining:

## 2022-11-07 NOTE — Progress Notes (Signed)
DECHLAN, HORSTMANN ([id number]) [id number] Page 1 of 6 Visit Report for 10/26/2022 Arrival Information Details Patient Name: Date of Service: [name], [name] 10/26/2022 2:45 PM Medical Record Number: [id number] Patient Account Number: [id number] Date of Birth/Sex: Treating RN: [birth year] (67 y.o. M) Primary Care Kester Stimpson: [name] Other Clinician: Referring Tymere Depuy: Treating Demareon Coldwell/Extender: Alver Fisher in Treatment: 64 Visit Information History Since Last Visit Added or deleted any medications: No Patient Arrived: [name] Any new allergies or adverse reactions: No Arrival Time: 15:05 Had a fall or experienced change in No Accompanied By: self activities of daily living that may affect Transfer Assistance: None risk of falls: Patient Identification Verified: Yes Signs or symptoms of abuse/neglect since last visito No Secondary Verification Process Completed: Yes Hospitalized since last visit: No Patient Requires Transmission-Based Precautions: No Implantable device outside of the clinic excluding No Patient Has Alerts: Yes cellular tissue based products placed in the center Patient Alerts: Patient on Blood Thinner since last visit: 01/2021 ABI L 1.13 R 1.06 Has Dressing in Place as Prescribed: Yes 01/2021 TBI L amp R 0.8 Has Compression in Place as Prescribed: Yes Pain Present Now: No Electronic Signature(s) Signed: 10/26/2022 4:39:33 PM By: Thayer Dallas Entered By: Thayer Dallas on 10/26/2022 15:06:21 -------------------------------------------------------------------------------- Compression Therapy Details Patient Name: Date of Service: Olene Craven [name]. 10/26/2022 2:45 PM Medical Record Number: [id number] Patient Account Number: [id number] Date of Birth/Sex: Treating RN: July 04, 1955 (67 y.o. Cline Cools Primary Care Kailyn Vanderslice: [name] Other Clinician: Referring Bindu Docter: Treating Sandeep Radell/Extender: Alver Fisher in Treatment: 08 Compression Therapy Performed for Wound Assessment: Wound #7 Right,Medial Malleolus Performed By: Clinician Redmond Pulling, RN Compression Type: Four Layer Post Procedure Diagnosis Same as Pre-procedure Electronic Signature(s) Signed: 11/07/2022 4:05:37 PM By: Redmond Pulling RN, BSN Entered By: Redmond Pulling on 10/26/2022 15:39:48 Mellinger, Eilam [name] ([id number]) [id number] Page 2 of 6 -------------------------------------------------------------------------------- Encounter Discharge Information Details Patient Name: Date of Service: [name], [name] 10/26/2022 2:45 PM Medical Record Number: [id number] Patient Account Number: [id number] Date of Birth/Sex: Treating RN: December 06, 1955 (67 y.o. Cline Cools Primary Care Elaysha Bevard: [name] Other Clinician: Referring Aaliya Maultsby: Treating Kathelyn Gombos/Extender: Alver Fisher in Treatment: 64 Encounter Discharge Information Items Discharge Condition: Stable Ambulatory Status: Walker Discharge Destination: Home Transportation: Private Auto Accompanied By: self Schedule Follow-up Appointment: Yes Clinical Summary of Care: Patient Declined Electronic Signature(s) Signed: 11/07/2022 4:05:37 PM By: Redmond Pulling RN, BSN Entered By: Redmond Pulling on 10/26/2022 15:40:48 -------------------------------------------------------------------------------- Lower Extremity Assessment Details Patient Name: Date of Service: [name], [name] Grooms [name]. 10/26/2022 2:45 PM Medical Record Number: [id number] Patient Account Number: [id number] Date of Birth/Sex: Treating RN: 1955-10-06 (67 y.o. M) Primary Care Kenderick Kobler: [name] Other Clinician: Referring Maysen Bonsignore: Treating Jamani Eley/Extender: Alver Fisher in Treatment: 64 Edema Assessment Assessed: [Left: No] [Right: No] Edema: [Left: N] [Right: o] Calf Left: Right: Point of Measurement: 31 cm  From Medial Instep 38.3 cm Ankle Left: Right: Point of Measurement: 11 cm From Medial Instep 25.5 cm Vascular Assessment Extremity colors, hair growth, and conditions: Extremity Color: [Right:Normal] Hair Growth on Extremity: [Right:No] Temperature of Extremity: [Right:Warm] Capillary Refill: [Right:< 3 seconds] Dependent Rubor: [Right:No Yes] Electronic Signature(s) Signed: 10/26/2022 4:39:33 PM By: Thayer Dallas Entered By: Thayer Dallas on 10/26/2022 15:10:35 Kosh, Dannielle Huh [name] ([id number]) [id number] Page 3 of 6 -------------------------------------------------------------------------------- Multi-Disciplinary Care Plan Details Patient Name: Date of Service: [name], [name] 10/26/2022 2:45 PM Medical Record Number: [id number] Patient Account Number:  [id number] Date of Birth/Sex: Treating RN: 1955-03-09 (67 y.o. Cline Cools Primary Care Fallan Mccarey: [name] Other Clinician: Referring Travonna Swindle: Treating Dontre Laduca/Extender: Alver Fisher in Treatment: 27 Multidisciplinary Care Plan reviewed with physician Active Inactive Pain, Acute or Chronic Nursing Diagnoses: Pain, acute or chronic: actual or potential Potential alteration in comfort, pain Goals: Patient will verbalize adequate pain control and receive pain control interventions during procedures as needed Date Initiated: 08/04/2021 Target Resolution Date: 01/10/2023 Goal Status: Active Patient/caregiver will verbalize comfort level met Date Initiated: 08/04/2021 Date Inactivated: 07/27/2022 Target Resolution Date: 09/10/2022 Goal Status: Met Interventions: Encourage patient to take pain medications as prescribed Provide education on pain management Reposition patient for comfort Treatment Activities: Administer pain control measures as ordered : 08/04/2021 Notes: Electronic Signature(s) Signed: 11/07/2022 4:05:37 PM By: Redmond Pulling RN, BSN Entered By: Redmond Pulling on 10/26/2022 15:26:55 -------------------------------------------------------------------------------- Pain Assessment Details Patient Name: Date of Service: Olene Craven [name]. 10/26/2022 2:45 PM Medical Record Number: [id number] Patient Account Number: [id number] Date of Birth/Sex: Treating RN: 1955/07/07 (67 y.o. M) Primary Care Consuelo Suthers: [name] Other Clinician: Referring Johnpatrick Jenny: Treating Fe Okubo/Extender: Alver Fisher in Treatment: 64 Active Problems Location of Pain Severity and Description of Pain Patient Has Paino No Site Locations [place], [place] [name] ([id number]) [id number] Page 4 of 6 Pain Management and Medication Current Pain Management: Electronic Signature(s) Signed: 10/26/2022 4:39:33 PM By: Thayer Dallas Entered By: Thayer Dallas on 10/26/2022 15:06:52 -------------------------------------------------------------------------------- Patient/Caregiver Education Details Patient Name: Date of Service: [name] 10/16/2024andnbsp2:45 PM Medical Record Number: [id number] Patient Account Number: [id number] Date of Birth/Gender: Treating RN: 1955/03/15 (67 y.o. Cline Cools Primary Care Physician: [name] Other Clinician: Referring Physician: Treating Physician/Extender: Alver Fisher in Treatment: 67 Education Assessment Education Provided To: Patient Education Topics Provided Wound/Skin Impairment: Methods: Explain/Verbal Responses: State content correctly [name]) Signed: 11/07/2022 4:05:37 PM By: Redmond Pulling RN, BSN Entered By: Redmond Pulling on 10/26/2022 15:27:34 -------------------------------------------------------------------------------- Wound Assessment Details Patient Name: Date of Service: [name], [name] 10/26/2022 2:45 PM Medical Record Number: [id number] Patient Account Number: [id number] Date of Birth/Sex: Treating RN: 04/26/1955 (67  y.o. M) Primary Care Aamori Mcmasters: [name] Other Clinician: Referring Kemara Quigley: Treating Annsley Akkerman/Extender: Alric Quan [place], [name] ([id number]) [id number] Page 5 of 6 Weeks in Treatment: 64 Wound Status Wound Number: 7 Primary Venous Leg Ulcer Etiology: Wound Location: Right, Medial Malleolus Secondary Lymphedema Wounding Event: Blister Etiology: Date Acquired: 07/21/2021 Wound Open Weeks Of Treatment: 64 Status: Clustered Wound: No Comorbid Sleep Apnea, Arrhythmia, Congestive Heart Failure, History: Hypertension, Hypotension, Peripheral Arterial Disease, Peripheral Venous Disease, Type II Diabetes, Gout, Osteoarthritis Photos Wound Measurements Length: (cm) Width: (cm) Depth: (cm) Area: (cm) Volume: (cm) 3.5 % Reduction in Area: 88.5% 1.6 % Reduction in Volume: 96.7% 0.2 Epithelialization: Medium (34-66%) 4.398 Tunneling: No 0.88 Undermining: No Wound Description Classification: Full Thickness With Exposed Supp Wound Margin: Distinct, outline attached Exudate Amount: Medium Exudate Type: Serosanguineous Exudate Color: red, brown ort Structures Foul Odor After Cleansing: No Slough/Fibrino Yes Wound Bed Granulation Amount: Large (67-100%) Exposed Structure Granulation Quality: Red Fascia Exposed: No Necrotic Amount: Small (1-33%) Fat Layer (Subcutaneous Tissue) Exposed: Yes Necrotic Quality: Adherent Slough Tendon Exposed: No Muscle Exposed: No Joint Exposed: No Bone Exposed: No Periwound Skin Texture Texture Color No Abnormalities Noted: No No Abnormalities Noted: No Callus: Yes Atrophie Blanche: No Crepitus: No Cyanosis: No Excoriation: No Ecchymosis: No Induration: No Erythema: No Rash: No Hemosiderin Staining:  No Scarring: No Mottled: No Pallor: No Moisture Rubor: No No Abnormalities Noted: No Dry / Scaly: Yes Temperature / Pain Maceration: No Temperature: No Abnormality Electronic  Signature(s) Signed: 11/02/2022 8:33:23 AM By: Karl Ito Entered By: Karl Ito on 10/26/2022 15:10:59 [name], [name] ([id number]) [id number] Page 6 of 6 -------------------------------------------------------------------------------- Vitals Details Patient Name: Date of Service: [name], [name] 10/26/2022 2:45 PM Medical Record Number: [id number] Patient Account Number: [id number] Date of Birth/Sex: Treating RN: 1955/04/26 (67 y.o. M) Primary Care Aahna Rossa: [name] Other Clinician: Referring Makynlee Kressin: Treating Tram Wrenn/Extender: Alver Fisher in Treatment: 64 Vital Signs Time Taken: 15:06 Temperature (F): 98 Height (in): 71 Pulse (bpm): 96 Weight (lbs): 350 Respiratory Rate (breaths/min): 18 Body Mass Index (BMI): 48.8 Blood Pressure (mmHg): 149/91 Reference Range: 80 - 120 mg / dl Electronic Signature(s) Signed: 10/26/2022 4:39:33 PM By: Thayer Dallas Entered By: Thayer Dallas on 10/26/2022 15:06:41

## 2022-11-09 ENCOUNTER — Encounter (HOSPITAL_BASED_OUTPATIENT_CLINIC_OR_DEPARTMENT_OTHER): Payer: Medicare Other | Admitting: Physician Assistant

## 2022-11-09 DIAGNOSIS — E1151 Type 2 diabetes mellitus with diabetic peripheral angiopathy without gangrene: Secondary | ICD-10-CM | POA: Diagnosis not present

## 2022-11-09 DIAGNOSIS — Z89432 Acquired absence of left foot: Secondary | ICD-10-CM | POA: Diagnosis not present

## 2022-11-09 DIAGNOSIS — I4891 Unspecified atrial fibrillation: Secondary | ICD-10-CM | POA: Diagnosis not present

## 2022-11-09 DIAGNOSIS — L97312 Non-pressure chronic ulcer of right ankle with fat layer exposed: Secondary | ICD-10-CM | POA: Diagnosis not present

## 2022-11-09 DIAGNOSIS — Z6841 Body Mass Index (BMI) 40.0 and over, adult: Secondary | ICD-10-CM | POA: Diagnosis not present

## 2022-11-09 DIAGNOSIS — M1990 Unspecified osteoarthritis, unspecified site: Secondary | ICD-10-CM | POA: Diagnosis not present

## 2022-11-09 DIAGNOSIS — I87333 Chronic venous hypertension (idiopathic) with ulcer and inflammation of bilateral lower extremity: Secondary | ICD-10-CM | POA: Diagnosis not present

## 2022-11-09 DIAGNOSIS — E11621 Type 2 diabetes mellitus with foot ulcer: Secondary | ICD-10-CM | POA: Diagnosis not present

## 2022-11-09 DIAGNOSIS — I2510 Atherosclerotic heart disease of native coronary artery without angina pectoris: Secondary | ICD-10-CM | POA: Diagnosis not present

## 2022-11-09 DIAGNOSIS — Z7901 Long term (current) use of anticoagulants: Secondary | ICD-10-CM | POA: Diagnosis not present

## 2022-11-09 DIAGNOSIS — I110 Hypertensive heart disease with heart failure: Secondary | ICD-10-CM | POA: Diagnosis not present

## 2022-11-09 DIAGNOSIS — I890 Lymphedema, not elsewhere classified: Secondary | ICD-10-CM | POA: Diagnosis not present

## 2022-11-09 DIAGNOSIS — I872 Venous insufficiency (chronic) (peripheral): Secondary | ICD-10-CM | POA: Diagnosis not present

## 2022-11-09 DIAGNOSIS — I87331 Chronic venous hypertension (idiopathic) with ulcer and inflammation of right lower extremity: Secondary | ICD-10-CM | POA: Diagnosis not present

## 2022-11-09 DIAGNOSIS — M109 Gout, unspecified: Secondary | ICD-10-CM | POA: Diagnosis not present

## 2022-11-09 DIAGNOSIS — L97518 Non-pressure chronic ulcer of other part of right foot with other specified severity: Secondary | ICD-10-CM | POA: Diagnosis not present

## 2022-11-09 DIAGNOSIS — Z950 Presence of cardiac pacemaker: Secondary | ICD-10-CM | POA: Diagnosis not present

## 2022-11-09 DIAGNOSIS — I509 Heart failure, unspecified: Secondary | ICD-10-CM | POA: Diagnosis not present

## 2022-11-09 DIAGNOSIS — L97818 Non-pressure chronic ulcer of other part of right lower leg with other specified severity: Secondary | ICD-10-CM | POA: Diagnosis not present

## 2022-11-09 DIAGNOSIS — L97828 Non-pressure chronic ulcer of other part of left lower leg with other specified severity: Secondary | ICD-10-CM | POA: Diagnosis not present

## 2022-11-09 DIAGNOSIS — Z8572 Personal history of non-Hodgkin lymphomas: Secondary | ICD-10-CM | POA: Diagnosis not present

## 2022-11-09 NOTE — Progress Notes (Addendum)
JADARIUS, KULISH ([id number]) [id number] Page 1 of 13 Visit Report for 11/09/2022 Chief Complaint Document Details Patient Name: Date of Service: DENNEY, WILLY 11/09/2022 2:45 PM Medical Record Number: [id number] Patient Account Number: [id number] Date of Birth/Sex: Treating RN: 05-Feb-1955 (67 y.o. M) Primary Care Provider: [name] Other Clinician: Referring Provider: Treating Provider/Extender: Alver Fisher in Treatment: 78 Information Obtained from: Patient Chief Complaint 08/04/2021; patient returns to clinic with bilateral leg wounds as well as areas on the right foot Electronic Signature(s) Signed: 11/09/2022 3:00:27 PM By: Allen Derry PA-C Entered By: Allen Derry on 11/09/2022 12:00:27 -------------------------------------------------------------------------------- Debridement Details Patient Name: Date of Service: Mcclard, [name] 11/09/2022 2:45 PM Medical Record Number: [id number] Patient Account Number: [id number] Date of Birth/Sex: Treating RN: 1955/07/06 (67 y.o. Charlean Merl, [name] Primary Care Provider: [name] Other Clinician: Referring Provider: Treating Provider/Extender: Alver Fisher in Treatment: 66 Debridement Performed for Assessment: Wound #7 Right,Medial Malleolus Performed By: Physician Lenda Kelp, PA The following information was scribed by: Fonnie Mu The information was scribed for: Lenda Kelp Debridement Type: Debridement Severity of Tissue Pre Debridement: Fat layer exposed Level of Consciousness (Pre-procedure): Awake and Alert Pre-procedure Verification/Time Out Yes - 15:51 Taken: Start Time: 15:51 Pain Control: Lidocaine Percent of Wound Bed Debrided: 100% T Area Debrided (cm): otal 2.73 Tissue and other material debrided: Viable, Non-Viable, Slough, Subcutaneous, Slough Level: Skin/Subcutaneous Tissue Debridement Description:  Excisional Instrument: Curette Bleeding: Minimum Hemostasis Achieved: Pressure End Time: 15:51 Procedural Pain: 0 Post Procedural Pain: 0 Response to Treatment: Procedure was tolerated well Level of Consciousness (Post- Awake and Alert procedure): Post Debridement Measurements of Total Wound Length: (cm) 2.9 Width: (cm) 1.2 Depth: (cm) 0.2 Bornemann, Halo D ([id number]) [id number] Page 2 of 13 Volume: (cm) 0.547 Character of Wound/Ulcer Post Debridement: Improved Severity of Tissue Post Debridement: Fat layer exposed Post Procedure Diagnosis Same as Pre-procedure Electronic Signature(s) Signed: 11/09/2022 4:22:52 PM By: Fonnie Mu RN Signed: 11/09/2022 4:33:40 PM By: Allen Derry PA-C Entered By: Fonnie Mu on 11/09/2022 12:54:32 -------------------------------------------------------------------------------- HPI Details Patient Name: Date of Service: Dupuy, [name] 11/09/2022 2:45 PM Medical Record Number: [id number] Patient Account Number: [id number] Date of Birth/Sex: Treating RN: 01/01/1956 (67 y.o. M) Primary Care Provider: [name] Other Clinician: Referring Provider: Treating Provider/Extender: Alver Fisher in Treatment: 3 History of Present Illness HPI Description: ADMISSION 03/22/2021 This is a 68 year old man with a past medical history significant for diabetes type 2, congestive heart failure, peripheral arterial disease, morbid obesity, venous insufficiency, and coronary artery disease. He has been followed by Dr. Loreta Ave in podiatry, who performed a transmetatarsal amputation on the left foot in August 2022. He had issues healing that wound, but based upon Dr. Kenna Gilbert notes, ultimately the TMA wound healed. During his recovery from that surgery, however, ulcers opened up over the DIP joint of the right second and third toe. These have apparently closed and reopened multiple times. It sounds like  one of the issues has been moisture accumulation and maceration of the tissues causing them to reopen. At his last visit with Dr. Loreta Ave, on March 01, 2021, there continues to be problems with moisture and he was referred to wound care for further evaluation and management. He had a formal aortogram with runoff performed prior to his TMA. The findings are copied here: Patient has inline flow to both feet with no significant flow-limiting lesion that would be amenable to percutaneous or open revascularization.  or diarrhea. 11-02-2022 upon evaluation today patient appears to be doing great in regard to his wound although he does have an area at the top of the wrap which sometimes gets moist which to be honest he actually appears to have a little bit of a fungal infection at this point. I think that we may need to treat this he can get to it and put some cream in there he actually does that on his own UA with some CeraVe. 11-09-2022 upon evaluation today patient's wound bed actually showed signs of good granulation epithelization at this point. Fortunately I do not see any signs of worsening overall and I do believe that the patient is making good headway towards closure. Patient's wound unfortunately at the top of the wrap appears to be still causing him problems despite the cream that we have been using over the past week.  We are going to try an oral antibiotic at this time. Electronic Signature(s) Signed: 11/09/2022 4:23:36 PM By: Allen Derry PA-C Entered By: Allen Derry on 11/09/2022 13:23:36 -------------------------------------------------------------------------------- Physical Exam Details Patient Name: Date of Service: JAISEAN, KUSNIERZ D. 11/09/2022 2:45 PM Medical Record Number: [id number] Patient Account Number: [id number] Date of Birth/Sex: Treating RN: [birth year] (67 y.o. M) Primary Care Provider: [name] Other Clinician: Referring Provider: Treating Provider/Extender: Alver Fisher in Treatment: 81 Constitutional Well-nourished and well-hydrated in no acute distress. Respiratory normal breathing without difficulty. Psychiatric this patient is able to make decisions and demonstrates good insight into disease process. Alert and Oriented x 3. pleasant and cooperative. Notes Upon inspection patient's wound bed actually showed signs of good granulation and epithelization at this point. Fortunately I do not see any signs of worsening overall and I do believe that the patient is making headway towards closure. Electronic Signature(s) Signed: 11/09/2022 4:23:52 PM By: Allen Derry PA-C Entered By: Allen Derry on 11/09/2022 13:23:52 Miano, [name] ([id number]) [id number] Page 6 of 13 -------------------------------------------------------------------------------- Physician Orders Details Patient Name: Date of Service: DENARIO, BAIK 11/09/2022 2:45 PM Medical Record Number: [id number] Patient Account Number: [id number] Date of Birth/Sex: Treating RN: 08/24/1955 (67 y.o. Charlean Merl, [name] Primary Care Provider: [name] Other Clinician: Referring Provider: Treating Provider/Extender: Alver Fisher in Treatment: 63 Verbal / Phone Orders: No Diagnosis Coding ICD-10 Coding Code Description I89.0 Lymphedema, not  elsewhere classified I87.333 Chronic venous hypertension (idiopathic) with ulcer and inflammation of bilateral lower extremity L97.828 Non-pressure chronic ulcer of other part of left lower leg with other specified severity L97.818 Non-pressure chronic ulcer of other part of right lower leg with other specified severity E11.621 Type 2 diabetes mellitus with foot ulcer L97.518 Non-pressure chronic ulcer of other part of right foot with other specified severity Follow-up Appointments ppointment in 1 week. Leonard Schwartz Wednesday 11/16/22 @ 2:45 Return A Other: - pick up Bactrim ASAP!! Anesthetic (In clinic) Topical Lidocaine 5% applied to wound bed - Used in Clinic Prior to debridement (In clinic) Topical Lidocaine 4% applied to wound bed Cellular or Tissue Based Products Cellular or Tissue Based Product Type: - Run IVR for Kerrecis- denied #1 Apligraf applied 02/23/2022 #2 Apligraf applied 03/02/2022 #3 Apligraf applied 03/16/2022 #4 Apligraf applied 03/30/2022 HOLD APLIGRAF this week. Cellular or Tissue Based Product applied to wound bed, secured with steri-strips, cover with Adaptic or Mepitel. (DO NOT REMOVE). Bathing/ Shower/ Hygiene May shower with protection but do not get wound dressing(s) wet. Protect dressing(s) with water repellant cover (for example, large plastic bag) or  JADARIUS, KULISH ([id number]) [id number] Page 1 of 13 Visit Report for 11/09/2022 Chief Complaint Document Details Patient Name: Date of Service: DENNEY, WILLY 11/09/2022 2:45 PM Medical Record Number: [id number] Patient Account Number: [id number] Date of Birth/Sex: Treating RN: 05-Feb-1955 (67 y.o. M) Primary Care Provider: [name] Other Clinician: Referring Provider: Treating Provider/Extender: Alver Fisher in Treatment: 78 Information Obtained from: Patient Chief Complaint 08/04/2021; patient returns to clinic with bilateral leg wounds as well as areas on the right foot Electronic Signature(s) Signed: 11/09/2022 3:00:27 PM By: Allen Derry PA-C Entered By: Allen Derry on 11/09/2022 12:00:27 -------------------------------------------------------------------------------- Debridement Details Patient Name: Date of Service: Mcclard, [name] 11/09/2022 2:45 PM Medical Record Number: [id number] Patient Account Number: [id number] Date of Birth/Sex: Treating RN: 1955/07/06 (67 y.o. Charlean Merl, [name] Primary Care Provider: [name] Other Clinician: Referring Provider: Treating Provider/Extender: Alver Fisher in Treatment: 66 Debridement Performed for Assessment: Wound #7 Right,Medial Malleolus Performed By: Physician Lenda Kelp, PA The following information was scribed by: Fonnie Mu The information was scribed for: Lenda Kelp Debridement Type: Debridement Severity of Tissue Pre Debridement: Fat layer exposed Level of Consciousness (Pre-procedure): Awake and Alert Pre-procedure Verification/Time Out Yes - 15:51 Taken: Start Time: 15:51 Pain Control: Lidocaine Percent of Wound Bed Debrided: 100% T Area Debrided (cm): otal 2.73 Tissue and other material debrided: Viable, Non-Viable, Slough, Subcutaneous, Slough Level: Skin/Subcutaneous Tissue Debridement Description:  Excisional Instrument: Curette Bleeding: Minimum Hemostasis Achieved: Pressure End Time: 15:51 Procedural Pain: 0 Post Procedural Pain: 0 Response to Treatment: Procedure was tolerated well Level of Consciousness (Post- Awake and Alert procedure): Post Debridement Measurements of Total Wound Length: (cm) 2.9 Width: (cm) 1.2 Depth: (cm) 0.2 Bornemann, Halo D ([id number]) [id number] Page 2 of 13 Volume: (cm) 0.547 Character of Wound/Ulcer Post Debridement: Improved Severity of Tissue Post Debridement: Fat layer exposed Post Procedure Diagnosis Same as Pre-procedure Electronic Signature(s) Signed: 11/09/2022 4:22:52 PM By: Fonnie Mu RN Signed: 11/09/2022 4:33:40 PM By: Allen Derry PA-C Entered By: Fonnie Mu on 11/09/2022 12:54:32 -------------------------------------------------------------------------------- HPI Details Patient Name: Date of Service: Dupuy, [name] 11/09/2022 2:45 PM Medical Record Number: [id number] Patient Account Number: [id number] Date of Birth/Sex: Treating RN: 01/01/1956 (67 y.o. M) Primary Care Provider: [name] Other Clinician: Referring Provider: Treating Provider/Extender: Alver Fisher in Treatment: 3 History of Present Illness HPI Description: ADMISSION 03/22/2021 This is a 68 year old man with a past medical history significant for diabetes type 2, congestive heart failure, peripheral arterial disease, morbid obesity, venous insufficiency, and coronary artery disease. He has been followed by Dr. Loreta Ave in podiatry, who performed a transmetatarsal amputation on the left foot in August 2022. He had issues healing that wound, but based upon Dr. Kenna Gilbert notes, ultimately the TMA wound healed. During his recovery from that surgery, however, ulcers opened up over the DIP joint of the right second and third toe. These have apparently closed and reopened multiple times. It sounds like  one of the issues has been moisture accumulation and maceration of the tissues causing them to reopen. At his last visit with Dr. Loreta Ave, on March 01, 2021, there continues to be problems with moisture and he was referred to wound care for further evaluation and management. He had a formal aortogram with runoff performed prior to his TMA. The findings are copied here: Patient has inline flow to both feet with no significant flow-limiting lesion that would be amenable to percutaneous or open revascularization.  a cast cover and may then take shower. Non Wound Condition Right Lower Extremity pply the following to affected area as directed: - ketoconazole A Wound Treatment Wound #7 - Malleolus Wound Laterality: Right, Medial Cleanser: Soap and Water 1 x Per Week/30 Days Discharge Instructions: May shower and wash wound with dial antibacterial soap and water prior to dressing change. Cleanser: Vashe 5.8 (oz) 1 x Per Week/30 Days Discharge Instructions: Cleanse the wound with Vashe prior to applying a clean dressing using gauze sponges, not tissue or cotton balls. Peri-Wound Care: Sween Lotion (Moisturizing lotion) 1 x Per  Week/30 Days Discharge Instructions: Apply moisturizing lotion as directed Topical: Gentamicin 1 x Per Week/30 Days Discharge Instructions: As directed by physician Prim Dressing: Endoform 2x2 in (Dispense As Written) 1 x Per Week/30 Days ary Discharge Instructions: Moisten with saline Secondary Dressing: Drawtex 4x4 in 1 x Per Week/30 Days Discharge Instructions: Apply over primary dressing as directed. Secondary Dressing: Zetuvit Plus 4x8 in 1 x Per Week/30 Days Discharge Instructions: Apply over primary dressing as directed. ELRICO, GERRICK ([id number]) [id number] Page 7 of 13 Compression Wrap: Urgo K2, (equivalent to a 4 layer) two layer compression system, regular 1 x Per Week/30 Days Discharge Instructions: Apply Urgo K2 as directed (alternative to 4 layer compression). Patient Medications llergies: latex, ACE Inhibitors, penicillin A Notifications Medication Indication Start End 11/09/2022 levofloxacin DOSE 1 - oral 500 mg tablet - 1 tablet oral once daily x 14 days Electronic Signature(s) Signed: 11/09/2022 4:26:12 PM By: Allen Derry PA-C Previous Signature: 11/09/2022 4:22:52 PM Version By: Fonnie Mu RN Entered By: Allen Derry on 11/09/2022 13:26:11 -------------------------------------------------------------------------------- Problem List Details Patient Name: Date of Service: Powe, [name] 11/09/2022 2:45 PM Medical Record Number: [id number] Patient Account Number: [id number] Date of Birth/Sex: Treating RN: 19-Oct-1955 (67 y.o. 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 leg with other specified 08/04/2021 No Yes severity L97.818 Non-pressure chronic ulcer of other part of right lower leg with other specified 08/04/2021 No Yes severity E11.621 Type 2 diabetes mellitus with foot ulcer 08/04/2021 No Yes L97.518 Non-pressure chronic ulcer of other part of right foot with other specified 08/04/2021 No Yes severity Inactive Problems Resolved Problems SURAJ, JOURNIGAN D ([id number]) [id number] Page 8 of 13 Electronic Signature(s) Signed: 11/09/2022 3:00:19 PM By: Allen Derry PA-C Entered By: Allen Derry on 11/09/2022 12:00:19 -------------------------------------------------------------------------------- Progress Note Details Patient Name: Date of Service: Adell, [name] 11/09/2022 2:45 PM Medical Record Number: [id number] Patient Account Number: [id number] Date of Birth/Sex: Treating RN: 04/22/1955 (67 y.o. M) Primary Care Provider: [name] Other Clinician: Referring Provider: Treating Provider/Extender: Alver Fisher in Treatment: 56 Subjective Chief Complaint Information obtained from Patient 08/04/2021; patient returns to clinic with bilateral leg wounds as well as areas on the right foot History of Present Illness (HPI) ADMISSION 03/22/2021 This is a 67 year old man with a past medical history significant for diabetes type 2, congestive heart failure, peripheral arterial disease, morbid obesity, venous insufficiency, and coronary artery disease. He has been followed by Dr. Loreta Ave in podiatry, who performed a transmetatarsal amputation on the left foot in August 2022. He had issues healing that wound, but  we will make an progress with regard to his wound each time I see it is get a little bit smaller. 2. I am going to recommend as well that he should continue with the wound care measures as before in that regard using the endoform. 3. With regard to the rash at the top of his leg we can actually try an antibiotic, go ahead and send in a prescription for an oral antibiotic to see if this will be of benefit we will see how things look over the next week. I am going to send in Levaquin as it is showing signs of potential for less interaction than the Bactrim which was my initial choice. We will see patient back for reevaluation in 1 week here in the clinic. If anything worsens or changes patient will contact our office for additional recommendations. Electronic Signature(s) Signed: 11/09/2022 4:26:34 PM By: Allen Derry PA-C Entered By: Allen Derry on 11/09/2022 13:26:34 Dehner, [name] ([id number]) [id number] Page 13 of 13 -------------------------------------------------------------------------------- SuperBill Details Patient Name: Date of Service: OTTIS, TELLMAN 11/09/2022 Medical Record Number: [id number] Patient Account Number: [id number] Date of Birth/Sex: Treating RN: Apr 27, 1955 (67 y.o. Charlean Merl, [name] Primary Care Provider: [name] Other Clinician: Referring Provider: Treating Provider/Extender: Alver Fisher in Treatment: 66 Diagnosis Coding ICD-10 Codes Code Description I89.0 Lymphedema, not elsewhere classified I87.333 Chronic venous hypertension (idiopathic) with ulcer and inflammation of bilateral  lower extremity L97.828 Non-pressure chronic ulcer of other part of left lower leg with other specified severity L97.818 Non-pressure chronic ulcer of other part of right lower leg with other specified severity E11.621 Type 2 diabetes mellitus with foot ulcer L97.518 Non-pressure chronic ulcer of other part of right foot with other specified severity Facility Procedures : 3 CPT4 Code: 5732202 Description: 11042 - DEB SUBQ TISSUE 20 SQ CM/< ICD-10 Diagnosis Description L97.818 Non-pressure chronic ulcer of other part of right lower leg with other specified Modifier: severity Quantity: 1 Physician Procedures : CPT4 Code Description Modifier [id number] 99214 - WC PHYS LEVEL 4 - EST PT 25 ICD-10 Diagnosis Description I89.0 Lymphedema, not elsewhere classified I87.333 Chronic venous hypertension (idiopathic) with ulcer and inflammation of bilateral lower extremity  L97.828 Non-pressure chronic ulcer of other part of left lower leg with other specified severity L97.818 Non-pressure chronic ulcer of other part of right lower leg with other specified severity Quantity: 1 : [id number] 11042 - WC PHYS SUBQ TISS 20 SQ CM ICD-10 Diagnosis Description L97.818 Non-pressure chronic ulcer of other part of right lower leg with other specified severity Quantity: 1 Electronic Signature(s) Signed: 11/09/2022 4:26:52 PM By: Allen Derry PA-C Previous Signature: 11/09/2022 4:22:52 PM Version By: Fonnie Mu RN Entered By: Allen Derry on 11/09/2022 13:26:51  a cast cover and may then take shower. Non Wound Condition Right Lower Extremity pply the following to affected area as directed: - ketoconazole A Wound Treatment Wound #7 - Malleolus Wound Laterality: Right, Medial Cleanser: Soap and Water 1 x Per Week/30 Days Discharge Instructions: May shower and wash wound with dial antibacterial soap and water prior to dressing change. Cleanser: Vashe 5.8 (oz) 1 x Per Week/30 Days Discharge Instructions: Cleanse the wound with Vashe prior to applying a clean dressing using gauze sponges, not tissue or cotton balls. Peri-Wound Care: Sween Lotion (Moisturizing lotion) 1 x Per  Week/30 Days Discharge Instructions: Apply moisturizing lotion as directed Topical: Gentamicin 1 x Per Week/30 Days Discharge Instructions: As directed by physician Prim Dressing: Endoform 2x2 in (Dispense As Written) 1 x Per Week/30 Days ary Discharge Instructions: Moisten with saline Secondary Dressing: Drawtex 4x4 in 1 x Per Week/30 Days Discharge Instructions: Apply over primary dressing as directed. Secondary Dressing: Zetuvit Plus 4x8 in 1 x Per Week/30 Days Discharge Instructions: Apply over primary dressing as directed. ELRICO, GERRICK ([id number]) [id number] Page 7 of 13 Compression Wrap: Urgo K2, (equivalent to a 4 layer) two layer compression system, regular 1 x Per Week/30 Days Discharge Instructions: Apply Urgo K2 as directed (alternative to 4 layer compression). Patient Medications llergies: latex, ACE Inhibitors, penicillin A Notifications Medication Indication Start End 11/09/2022 levofloxacin DOSE 1 - oral 500 mg tablet - 1 tablet oral once daily x 14 days Electronic Signature(s) Signed: 11/09/2022 4:26:12 PM By: Allen Derry PA-C Previous Signature: 11/09/2022 4:22:52 PM Version By: Fonnie Mu RN Entered By: Allen Derry on 11/09/2022 13:26:11 -------------------------------------------------------------------------------- Problem List Details Patient Name: Date of Service: Powe, [name] 11/09/2022 2:45 PM Medical Record Number: [id number] Patient Account Number: [id number] Date of Birth/Sex: Treating RN: 19-Oct-1955 (67 y.o. 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 leg with other specified 08/04/2021 No Yes severity L97.818 Non-pressure chronic ulcer of other part of right lower leg with other specified 08/04/2021 No Yes severity E11.621 Type 2 diabetes mellitus with foot ulcer 08/04/2021 No Yes L97.518 Non-pressure chronic ulcer of other part of right foot with other specified 08/04/2021 No Yes severity Inactive Problems Resolved Problems SURAJ, JOURNIGAN D ([id number]) [id number] Page 8 of 13 Electronic Signature(s) Signed: 11/09/2022 3:00:19 PM By: Allen Derry PA-C Entered By: Allen Derry on 11/09/2022 12:00:19 -------------------------------------------------------------------------------- Progress Note Details Patient Name: Date of Service: Adell, [name] 11/09/2022 2:45 PM Medical Record Number: [id number] Patient Account Number: [id number] Date of Birth/Sex: Treating RN: 04/22/1955 (67 y.o. M) Primary Care Provider: [name] Other Clinician: Referring Provider: Treating Provider/Extender: Alver Fisher in Treatment: 56 Subjective Chief Complaint Information obtained from Patient 08/04/2021; patient returns to clinic with bilateral leg wounds as well as areas on the right foot History of Present Illness (HPI) ADMISSION 03/22/2021 This is a 67 year old man with a past medical history significant for diabetes type 2, congestive heart failure, peripheral arterial disease, morbid obesity, venous insufficiency, and coronary artery disease. He has been followed by Dr. Loreta Ave in podiatry, who performed a transmetatarsal amputation on the left foot in August 2022. He had issues healing that wound, but  a cast cover and may then take shower. Non Wound Condition Right Lower Extremity pply the following to affected area as directed: - ketoconazole A Wound Treatment Wound #7 - Malleolus Wound Laterality: Right, Medial Cleanser: Soap and Water 1 x Per Week/30 Days Discharge Instructions: May shower and wash wound with dial antibacterial soap and water prior to dressing change. Cleanser: Vashe 5.8 (oz) 1 x Per Week/30 Days Discharge Instructions: Cleanse the wound with Vashe prior to applying a clean dressing using gauze sponges, not tissue or cotton balls. Peri-Wound Care: Sween Lotion (Moisturizing lotion) 1 x Per  Week/30 Days Discharge Instructions: Apply moisturizing lotion as directed Topical: Gentamicin 1 x Per Week/30 Days Discharge Instructions: As directed by physician Prim Dressing: Endoform 2x2 in (Dispense As Written) 1 x Per Week/30 Days ary Discharge Instructions: Moisten with saline Secondary Dressing: Drawtex 4x4 in 1 x Per Week/30 Days Discharge Instructions: Apply over primary dressing as directed. Secondary Dressing: Zetuvit Plus 4x8 in 1 x Per Week/30 Days Discharge Instructions: Apply over primary dressing as directed. ELRICO, GERRICK ([id number]) [id number] Page 7 of 13 Compression Wrap: Urgo K2, (equivalent to a 4 layer) two layer compression system, regular 1 x Per Week/30 Days Discharge Instructions: Apply Urgo K2 as directed (alternative to 4 layer compression). Patient Medications llergies: latex, ACE Inhibitors, penicillin A Notifications Medication Indication Start End 11/09/2022 levofloxacin DOSE 1 - oral 500 mg tablet - 1 tablet oral once daily x 14 days Electronic Signature(s) Signed: 11/09/2022 4:26:12 PM By: Allen Derry PA-C Previous Signature: 11/09/2022 4:22:52 PM Version By: Fonnie Mu RN Entered By: Allen Derry on 11/09/2022 13:26:11 -------------------------------------------------------------------------------- Problem List Details Patient Name: Date of Service: Powe, [name] 11/09/2022 2:45 PM Medical Record Number: [id number] Patient Account Number: [id number] Date of Birth/Sex: Treating RN: 19-Oct-1955 (67 y.o. 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 leg with other specified 08/04/2021 No Yes severity L97.818 Non-pressure chronic ulcer of other part of right lower leg with other specified 08/04/2021 No Yes severity E11.621 Type 2 diabetes mellitus with foot ulcer 08/04/2021 No Yes L97.518 Non-pressure chronic ulcer of other part of right foot with other specified 08/04/2021 No Yes severity Inactive Problems Resolved Problems SURAJ, JOURNIGAN D ([id number]) [id number].pdf Page 8 of 13 Electronic Signature(s) Signed: 11/09/2022 3:00:19 PM By: Allen Derry PA-C Entered By: Allen Derry on 11/09/2022 12:00:19 -------------------------------------------------------------------------------- Progress Note Details Patient Name: Date of Service: Adell, [name] 11/09/2022 2:45 PM Medical Record Number: [id number] Patient Account Number: [id number] Date of Birth/Sex: Treating RN: 04/22/1955 (67 y.o. M) Primary Care Provider: [name] Other Clinician: Referring Provider: Treating Provider/Extender: Alver Fisher in Treatment: 56 Subjective Chief Complaint Information obtained from Patient 08/04/2021; patient returns to clinic with bilateral leg wounds as well as areas on the right foot History of Present Illness (HPI) ADMISSION 03/22/2021 This is a 67 year old man with a past medical history significant for diabetes type 2, congestive heart failure, peripheral arterial disease, morbid obesity, venous insufficiency, and coronary artery disease. He has been followed by Dr. Loreta Ave in podiatry, who performed a transmetatarsal amputation on the left foot in August 2022. He had issues healing that wound, but  JADARIUS, KULISH ([id number]) [id number] Page 1 of 13 Visit Report for 11/09/2022 Chief Complaint Document Details Patient Name: Date of Service: DENNEY, WILLY 11/09/2022 2:45 PM Medical Record Number: [id number] Patient Account Number: [id number] Date of Birth/Sex: Treating RN: 05-Feb-1955 (67 y.o. M) Primary Care Provider: [name] Other Clinician: Referring Provider: Treating Provider/Extender: Alver Fisher in Treatment: 78 Information Obtained from: Patient Chief Complaint 08/04/2021; patient returns to clinic with bilateral leg wounds as well as areas on the right foot Electronic Signature(s) Signed: 11/09/2022 3:00:27 PM By: Allen Derry PA-C Entered By: Allen Derry on 11/09/2022 12:00:27 -------------------------------------------------------------------------------- Debridement Details Patient Name: Date of Service: Mcclard, [name] 11/09/2022 2:45 PM Medical Record Number: [id number] Patient Account Number: [id number] Date of Birth/Sex: Treating RN: 1955/07/06 (67 y.o. Charlean Merl, [name] Primary Care Provider: [name] Other Clinician: Referring Provider: Treating Provider/Extender: Alver Fisher in Treatment: 66 Debridement Performed for Assessment: Wound #7 Right,Medial Malleolus Performed By: Physician Lenda Kelp, PA The following information was scribed by: Fonnie Mu The information was scribed for: Lenda Kelp Debridement Type: Debridement Severity of Tissue Pre Debridement: Fat layer exposed Level of Consciousness (Pre-procedure): Awake and Alert Pre-procedure Verification/Time Out Yes - 15:51 Taken: Start Time: 15:51 Pain Control: Lidocaine Percent of Wound Bed Debrided: 100% T Area Debrided (cm): otal 2.73 Tissue and other material debrided: Viable, Non-Viable, Slough, Subcutaneous, Slough Level: Skin/Subcutaneous Tissue Debridement Description:  Excisional Instrument: Curette Bleeding: Minimum Hemostasis Achieved: Pressure End Time: 15:51 Procedural Pain: 0 Post Procedural Pain: 0 Response to Treatment: Procedure was tolerated well Level of Consciousness (Post- Awake and Alert procedure): Post Debridement Measurements of Total Wound Length: (cm) 2.9 Width: (cm) 1.2 Depth: (cm) 0.2 Bornemann, Halo D ([id number]) [id number] Page 2 of 13 Volume: (cm) 0.547 Character of Wound/Ulcer Post Debridement: Improved Severity of Tissue Post Debridement: Fat layer exposed Post Procedure Diagnosis Same as Pre-procedure Electronic Signature(s) Signed: 11/09/2022 4:22:52 PM By: Fonnie Mu RN Signed: 11/09/2022 4:33:40 PM By: Allen Derry PA-C Entered By: Fonnie Mu on 11/09/2022 12:54:32 -------------------------------------------------------------------------------- HPI Details Patient Name: Date of Service: Dupuy, [name] 11/09/2022 2:45 PM Medical Record Number: [id number] Patient Account Number: [id number] Date of Birth/Sex: Treating RN: 01/01/1956 (67 y.o. M) Primary Care Provider: [name] Other Clinician: Referring Provider: Treating Provider/Extender: Alver Fisher in Treatment: 3 History of Present Illness HPI Description: ADMISSION 03/22/2021 This is a 68 year old man with a past medical history significant for diabetes type 2, congestive heart failure, peripheral arterial disease, morbid obesity, venous insufficiency, and coronary artery disease. He has been followed by Dr. Loreta Ave in podiatry, who performed a transmetatarsal amputation on the left foot in August 2022. He had issues healing that wound, but based upon Dr. Kenna Gilbert notes, ultimately the TMA wound healed. During his recovery from that surgery, however, ulcers opened up over the DIP joint of the right second and third toe. These have apparently closed and reopened multiple times. It sounds like  one of the issues has been moisture accumulation and maceration of the tissues causing them to reopen. At his last visit with Dr. Loreta Ave, on March 01, 2021, there continues to be problems with moisture and he was referred to wound care for further evaluation and management. He had a formal aortogram with runoff performed prior to his TMA. The findings are copied here: Patient has inline flow to both feet with no significant flow-limiting lesion that would be amenable to percutaneous or open revascularization.  we will make an progress with regard to his wound each time I see it is get a little bit smaller. 2. I am going to recommend as well that he should continue with the wound care measures as before in that regard using the endoform. 3. With regard to the rash at the top of his leg we can actually try an antibiotic, go ahead and send in a prescription for an oral antibiotic to see if this will be of benefit we will see how things look over the next week. I am going to send in Levaquin as it is showing signs of potential for less interaction than the Bactrim which was my initial choice. We will see patient back for reevaluation in 1 week here in the clinic. If anything worsens or changes patient will contact our office for additional recommendations. Electronic Signature(s) Signed: 11/09/2022 4:26:34 PM By: Allen Derry PA-C Entered By: Allen Derry on 11/09/2022 13:26:34 Dehner, [name] ([id number]) [id number] Page 13 of 13 -------------------------------------------------------------------------------- SuperBill Details Patient Name: Date of Service: OTTIS, TELLMAN 11/09/2022 Medical Record Number: [id number] Patient Account Number: [id number] Date of Birth/Sex: Treating RN: Apr 27, 1955 (67 y.o. Charlean Merl, [name] Primary Care Provider: [name] Other Clinician: Referring Provider: Treating Provider/Extender: Alver Fisher in Treatment: 66 Diagnosis Coding ICD-10 Codes Code Description I89.0 Lymphedema, not elsewhere classified I87.333 Chronic venous hypertension (idiopathic) with ulcer and inflammation of bilateral  lower extremity L97.828 Non-pressure chronic ulcer of other part of left lower leg with other specified severity L97.818 Non-pressure chronic ulcer of other part of right lower leg with other specified severity E11.621 Type 2 diabetes mellitus with foot ulcer L97.518 Non-pressure chronic ulcer of other part of right foot with other specified severity Facility Procedures : 3 CPT4 Code: 5732202 Description: 11042 - DEB SUBQ TISSUE 20 SQ CM/< ICD-10 Diagnosis Description L97.818 Non-pressure chronic ulcer of other part of right lower leg with other specified Modifier: severity Quantity: 1 Physician Procedures : CPT4 Code Description Modifier [id number] 99214 - WC PHYS LEVEL 4 - EST PT 25 ICD-10 Diagnosis Description I89.0 Lymphedema, not elsewhere classified I87.333 Chronic venous hypertension (idiopathic) with ulcer and inflammation of bilateral lower extremity  L97.828 Non-pressure chronic ulcer of other part of left lower leg with other specified severity L97.818 Non-pressure chronic ulcer of other part of right lower leg with other specified severity Quantity: 1 : [id number] 11042 - WC PHYS SUBQ TISS 20 SQ CM ICD-10 Diagnosis Description L97.818 Non-pressure chronic ulcer of other part of right lower leg with other specified severity Quantity: 1 Electronic Signature(s) Signed: 11/09/2022 4:26:52 PM By: Allen Derry PA-C Previous Signature: 11/09/2022 4:22:52 PM Version By: Fonnie Mu RN Entered By: Allen Derry on 11/09/2022 13:26:51  we will make an progress with regard to his wound each time I see it is get a little bit smaller. 2. I am going to recommend as well that he should continue with the wound care measures as before in that regard using the endoform. 3. With regard to the rash at the top of his leg we can actually try an antibiotic, go ahead and send in a prescription for an oral antibiotic to see if this will be of benefit we will see how things look over the next week. I am going to send in Levaquin as it is showing signs of potential for less interaction than the Bactrim which was my initial choice. We will see patient back for reevaluation in 1 week here in the clinic. If anything worsens or changes patient will contact our office for additional recommendations. Electronic Signature(s) Signed: 11/09/2022 4:26:34 PM By: Allen Derry PA-C Entered By: Allen Derry on 11/09/2022 13:26:34 Dehner, [name] ([id number]) [id number] Page 13 of 13 -------------------------------------------------------------------------------- SuperBill Details Patient Name: Date of Service: OTTIS, TELLMAN 11/09/2022 Medical Record Number: [id number] Patient Account Number: [id number] Date of Birth/Sex: Treating RN: Apr 27, 1955 (67 y.o. Charlean Merl, [name] Primary Care Provider: [name] Other Clinician: Referring Provider: Treating Provider/Extender: Alver Fisher in Treatment: 66 Diagnosis Coding ICD-10 Codes Code Description I89.0 Lymphedema, not elsewhere classified I87.333 Chronic venous hypertension (idiopathic) with ulcer and inflammation of bilateral  lower extremity L97.828 Non-pressure chronic ulcer of other part of left lower leg with other specified severity L97.818 Non-pressure chronic ulcer of other part of right lower leg with other specified severity E11.621 Type 2 diabetes mellitus with foot ulcer L97.518 Non-pressure chronic ulcer of other part of right foot with other specified severity Facility Procedures : 3 CPT4 Code: 5732202 Description: 11042 - DEB SUBQ TISSUE 20 SQ CM/< ICD-10 Diagnosis Description L97.818 Non-pressure chronic ulcer of other part of right lower leg with other specified Modifier: severity Quantity: 1 Physician Procedures : CPT4 Code Description Modifier [id number] 99214 - WC PHYS LEVEL 4 - EST PT 25 ICD-10 Diagnosis Description I89.0 Lymphedema, not elsewhere classified I87.333 Chronic venous hypertension (idiopathic) with ulcer and inflammation of bilateral lower extremity  L97.828 Non-pressure chronic ulcer of other part of left lower leg with other specified severity L97.818 Non-pressure chronic ulcer of other part of right lower leg with other specified severity Quantity: 1 : [id number] 11042 - WC PHYS SUBQ TISS 20 SQ CM ICD-10 Diagnosis Description L97.818 Non-pressure chronic ulcer of other part of right lower leg with other specified severity Quantity: 1 Electronic Signature(s) Signed: 11/09/2022 4:26:52 PM By: Allen Derry PA-C Previous Signature: 11/09/2022 4:22:52 PM Version By: Fonnie Mu RN Entered By: Allen Derry on 11/09/2022 13:26:51  a cast cover and may then take shower. Non Wound Condition Right Lower Extremity pply the following to affected area as directed: - ketoconazole A Wound Treatment Wound #7 - Malleolus Wound Laterality: Right, Medial Cleanser: Soap and Water 1 x Per Week/30 Days Discharge Instructions: May shower and wash wound with dial antibacterial soap and water prior to dressing change. Cleanser: Vashe 5.8 (oz) 1 x Per Week/30 Days Discharge Instructions: Cleanse the wound with Vashe prior to applying a clean dressing using gauze sponges, not tissue or cotton balls. Peri-Wound Care: Sween Lotion (Moisturizing lotion) 1 x Per  Week/30 Days Discharge Instructions: Apply moisturizing lotion as directed Topical: Gentamicin 1 x Per Week/30 Days Discharge Instructions: As directed by physician Prim Dressing: Endoform 2x2 in (Dispense As Written) 1 x Per Week/30 Days ary Discharge Instructions: Moisten with saline Secondary Dressing: Drawtex 4x4 in 1 x Per Week/30 Days Discharge Instructions: Apply over primary dressing as directed. Secondary Dressing: Zetuvit Plus 4x8 in 1 x Per Week/30 Days Discharge Instructions: Apply over primary dressing as directed. ELRICO, GERRICK ([id number]) [id number] Page 7 of 13 Compression Wrap: Urgo K2, (equivalent to a 4 layer) two layer compression system, regular 1 x Per Week/30 Days Discharge Instructions: Apply Urgo K2 as directed (alternative to 4 layer compression). Patient Medications llergies: latex, ACE Inhibitors, penicillin A Notifications Medication Indication Start End 11/09/2022 levofloxacin DOSE 1 - oral 500 mg tablet - 1 tablet oral once daily x 14 days Electronic Signature(s) Signed: 11/09/2022 4:26:12 PM By: Allen Derry PA-C Previous Signature: 11/09/2022 4:22:52 PM Version By: Fonnie Mu RN Entered By: Allen Derry on 11/09/2022 13:26:11 -------------------------------------------------------------------------------- Problem List Details Patient Name: Date of Service: Powe, [name] 11/09/2022 2:45 PM Medical Record Number: [id number] Patient Account Number: [id number] Date of Birth/Sex: Treating RN: 19-Oct-1955 (67 y.o. 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 leg with other specified 08/04/2021 No Yes severity L97.818 Non-pressure chronic ulcer of other part of right lower leg with other specified 08/04/2021 No Yes severity E11.621 Type 2 diabetes mellitus with foot ulcer 08/04/2021 No Yes L97.518 Non-pressure chronic ulcer of other part of right foot with other specified 08/04/2021 No Yes severity Inactive Problems Resolved Problems SURAJ, JOURNIGAN D ([id number]) [id number].pdf Page 8 of 13 Electronic Signature(s) Signed: 11/09/2022 3:00:19 PM By: Allen Derry PA-C Entered By: Allen Derry on 11/09/2022 12:00:19 -------------------------------------------------------------------------------- Progress Note Details Patient Name: Date of Service: Adell, [name] 11/09/2022 2:45 PM Medical Record Number: [id number] Patient Account Number: [id number] Date of Birth/Sex: Treating RN: 04/22/1955 (67 y.o. M) Primary Care Provider: [name] Other Clinician: Referring Provider: Treating Provider/Extender: Alver Fisher in Treatment: 56 Subjective Chief Complaint Information obtained from Patient 08/04/2021; patient returns to clinic with bilateral leg wounds as well as areas on the right foot History of Present Illness (HPI) ADMISSION 03/22/2021 This is a 67 year old man with a past medical history significant for diabetes type 2, congestive heart failure, peripheral arterial disease, morbid obesity, venous insufficiency, and coronary artery disease. He has been followed by Dr. Loreta Ave in podiatry, who performed a transmetatarsal amputation on the left foot in August 2022. He had issues healing that wound, but  we will make an progress with regard to his wound each time I see it is get a little bit smaller. 2. I am going to recommend as well that he should continue with the wound care measures as before in that regard using the endoform. 3. With regard to the rash at the top of his leg we can actually try an antibiotic, go ahead and send in a prescription for an oral antibiotic to see if this will be of benefit we will see how things look over the next week. I am going to send in Levaquin as it is showing signs of potential for less interaction than the Bactrim which was my initial choice. We will see patient back for reevaluation in 1 week here in the clinic. If anything worsens or changes patient will contact our office for additional recommendations. Electronic Signature(s) Signed: 11/09/2022 4:26:34 PM By: Allen Derry PA-C Entered By: Allen Derry on 11/09/2022 13:26:34 Dehner, [name] ([id number]) [id number] Page 13 of 13 -------------------------------------------------------------------------------- SuperBill Details Patient Name: Date of Service: OTTIS, TELLMAN 11/09/2022 Medical Record Number: [id number] Patient Account Number: [id number] Date of Birth/Sex: Treating RN: Apr 27, 1955 (67 y.o. Charlean Merl, [name] Primary Care Provider: [name] Other Clinician: Referring Provider: Treating Provider/Extender: Alver Fisher in Treatment: 66 Diagnosis Coding ICD-10 Codes Code Description I89.0 Lymphedema, not elsewhere classified I87.333 Chronic venous hypertension (idiopathic) with ulcer and inflammation of bilateral  lower extremity L97.828 Non-pressure chronic ulcer of other part of left lower leg with other specified severity L97.818 Non-pressure chronic ulcer of other part of right lower leg with other specified severity E11.621 Type 2 diabetes mellitus with foot ulcer L97.518 Non-pressure chronic ulcer of other part of right foot with other specified severity Facility Procedures : 3 CPT4 Code: 5732202 Description: 11042 - DEB SUBQ TISSUE 20 SQ CM/< ICD-10 Diagnosis Description L97.818 Non-pressure chronic ulcer of other part of right lower leg with other specified Modifier: severity Quantity: 1 Physician Procedures : CPT4 Code Description Modifier [id number] 99214 - WC PHYS LEVEL 4 - EST PT 25 ICD-10 Diagnosis Description I89.0 Lymphedema, not elsewhere classified I87.333 Chronic venous hypertension (idiopathic) with ulcer and inflammation of bilateral lower extremity  L97.828 Non-pressure chronic ulcer of other part of left lower leg with other specified severity L97.818 Non-pressure chronic ulcer of other part of right lower leg with other specified severity Quantity: 1 : [id number] 11042 - WC PHYS SUBQ TISS 20 SQ CM ICD-10 Diagnosis Description L97.818 Non-pressure chronic ulcer of other part of right lower leg with other specified severity Quantity: 1 Electronic Signature(s) Signed: 11/09/2022 4:26:52 PM By: Allen Derry PA-C Previous Signature: 11/09/2022 4:22:52 PM Version By: Fonnie Mu RN Entered By: Allen Derry on 11/09/2022 13:26:51  or diarrhea. 11-02-2022 upon evaluation today patient appears to be doing great in regard to his wound although he does have an area at the top of the wrap which sometimes gets moist which to be honest he actually appears to have a little bit of a fungal infection at this point. I think that we may need to treat this he can get to it and put some cream in there he actually does that on his own UA with some CeraVe. 11-09-2022 upon evaluation today patient's wound bed actually showed signs of good granulation epithelization at this point. Fortunately I do not see any signs of worsening overall and I do believe that the patient is making good headway towards closure. Patient's wound unfortunately at the top of the wrap appears to be still causing him problems despite the cream that we have been using over the past week.  We are going to try an oral antibiotic at this time. Electronic Signature(s) Signed: 11/09/2022 4:23:36 PM By: Allen Derry PA-C Entered By: Allen Derry on 11/09/2022 13:23:36 -------------------------------------------------------------------------------- Physical Exam Details Patient Name: Date of Service: JAISEAN, KUSNIERZ D. 11/09/2022 2:45 PM Medical Record Number: [id number] Patient Account Number: [id number] Date of Birth/Sex: Treating RN: [birth year] (67 y.o. M) Primary Care Provider: [name] Other Clinician: Referring Provider: Treating Provider/Extender: Alver Fisher in Treatment: 81 Constitutional Well-nourished and well-hydrated in no acute distress. Respiratory normal breathing without difficulty. Psychiatric this patient is able to make decisions and demonstrates good insight into disease process. Alert and Oriented x 3. pleasant and cooperative. Notes Upon inspection patient's wound bed actually showed signs of good granulation and epithelization at this point. Fortunately I do not see any signs of worsening overall and I do believe that the patient is making headway towards closure. Electronic Signature(s) Signed: 11/09/2022 4:23:52 PM By: Allen Derry PA-C Entered By: Allen Derry on 11/09/2022 13:23:52 Miano, [name] ([id number]) [id number] Page 6 of 13 -------------------------------------------------------------------------------- Physician Orders Details Patient Name: Date of Service: DENARIO, BAIK 11/09/2022 2:45 PM Medical Record Number: [id number] Patient Account Number: [id number] Date of Birth/Sex: Treating RN: 08/24/1955 (67 y.o. Charlean Merl, [name] Primary Care Provider: [name] Other Clinician: Referring Provider: Treating Provider/Extender: Alver Fisher in Treatment: 63 Verbal / Phone Orders: No Diagnosis Coding ICD-10 Coding Code Description I89.0 Lymphedema, not  elsewhere classified I87.333 Chronic venous hypertension (idiopathic) with ulcer and inflammation of bilateral lower extremity L97.828 Non-pressure chronic ulcer of other part of left lower leg with other specified severity L97.818 Non-pressure chronic ulcer of other part of right lower leg with other specified severity E11.621 Type 2 diabetes mellitus with foot ulcer L97.518 Non-pressure chronic ulcer of other part of right foot with other specified severity Follow-up Appointments ppointment in 1 week. Leonard Schwartz Wednesday 11/16/22 @ 2:45 Return A Other: - pick up Bactrim ASAP!! Anesthetic (In clinic) Topical Lidocaine 5% applied to wound bed - Used in Clinic Prior to debridement (In clinic) Topical Lidocaine 4% applied to wound bed Cellular or Tissue Based Products Cellular or Tissue Based Product Type: - Run IVR for Kerrecis- denied #1 Apligraf applied 02/23/2022 #2 Apligraf applied 03/02/2022 #3 Apligraf applied 03/16/2022 #4 Apligraf applied 03/30/2022 HOLD APLIGRAF this week. Cellular or Tissue Based Product applied to wound bed, secured with steri-strips, cover with Adaptic or Mepitel. (DO NOT REMOVE). Bathing/ Shower/ Hygiene May shower with protection but do not get wound dressing(s) wet. Protect dressing(s) with water repellant cover (for example, large plastic bag) or  or diarrhea. 11-02-2022 upon evaluation today patient appears to be doing great in regard to his wound although he does have an area at the top of the wrap which sometimes gets moist which to be honest he actually appears to have a little bit of a fungal infection at this point. I think that we may need to treat this he can get to it and put some cream in there he actually does that on his own UA with some CeraVe. 11-09-2022 upon evaluation today patient's wound bed actually showed signs of good granulation epithelization at this point. Fortunately I do not see any signs of worsening overall and I do believe that the patient is making good headway towards closure. Patient's wound unfortunately at the top of the wrap appears to be still causing him problems despite the cream that we have been using over the past week.  We are going to try an oral antibiotic at this time. Electronic Signature(s) Signed: 11/09/2022 4:23:36 PM By: Allen Derry PA-C Entered By: Allen Derry on 11/09/2022 13:23:36 -------------------------------------------------------------------------------- Physical Exam Details Patient Name: Date of Service: JAISEAN, KUSNIERZ D. 11/09/2022 2:45 PM Medical Record Number: [id number] Patient Account Number: [id number] Date of Birth/Sex: Treating RN: [birth year] (67 y.o. M) Primary Care Provider: [name] Other Clinician: Referring Provider: Treating Provider/Extender: Alver Fisher in Treatment: 81 Constitutional Well-nourished and well-hydrated in no acute distress. Respiratory normal breathing without difficulty. Psychiatric this patient is able to make decisions and demonstrates good insight into disease process. Alert and Oriented x 3. pleasant and cooperative. Notes Upon inspection patient's wound bed actually showed signs of good granulation and epithelization at this point. Fortunately I do not see any signs of worsening overall and I do believe that the patient is making headway towards closure. Electronic Signature(s) Signed: 11/09/2022 4:23:52 PM By: Allen Derry PA-C Entered By: Allen Derry on 11/09/2022 13:23:52 Miano, [name] ([id number]) [id number] Page 6 of 13 -------------------------------------------------------------------------------- Physician Orders Details Patient Name: Date of Service: DENARIO, BAIK 11/09/2022 2:45 PM Medical Record Number: [id number] Patient Account Number: [id number] Date of Birth/Sex: Treating RN: 08/24/1955 (67 y.o. Charlean Merl, [name] Primary Care Provider: [name] Other Clinician: Referring Provider: Treating Provider/Extender: Alver Fisher in Treatment: 63 Verbal / Phone Orders: No Diagnosis Coding ICD-10 Coding Code Description I89.0 Lymphedema, not  elsewhere classified I87.333 Chronic venous hypertension (idiopathic) with ulcer and inflammation of bilateral lower extremity L97.828 Non-pressure chronic ulcer of other part of left lower leg with other specified severity L97.818 Non-pressure chronic ulcer of other part of right lower leg with other specified severity E11.621 Type 2 diabetes mellitus with foot ulcer L97.518 Non-pressure chronic ulcer of other part of right foot with other specified severity Follow-up Appointments ppointment in 1 week. Leonard Schwartz Wednesday 11/16/22 @ 2:45 Return A Other: - pick up Bactrim ASAP!! Anesthetic (In clinic) Topical Lidocaine 5% applied to wound bed - Used in Clinic Prior to debridement (In clinic) Topical Lidocaine 4% applied to wound bed Cellular or Tissue Based Products Cellular or Tissue Based Product Type: - Run IVR for Kerrecis- denied #1 Apligraf applied 02/23/2022 #2 Apligraf applied 03/02/2022 #3 Apligraf applied 03/16/2022 #4 Apligraf applied 03/30/2022 HOLD APLIGRAF this week. Cellular or Tissue Based Product applied to wound bed, secured with steri-strips, cover with Adaptic or Mepitel. (DO NOT REMOVE). Bathing/ Shower/ Hygiene May shower with protection but do not get wound dressing(s) wet. Protect dressing(s) with water repellant cover (for example, large plastic bag) or  or diarrhea. 11-02-2022 upon evaluation today patient appears to be doing great in regard to his wound although he does have an area at the top of the wrap which sometimes gets moist which to be honest he actually appears to have a little bit of a fungal infection at this point. I think that we may need to treat this he can get to it and put some cream in there he actually does that on his own UA with some CeraVe. 11-09-2022 upon evaluation today patient's wound bed actually showed signs of good granulation epithelization at this point. Fortunately I do not see any signs of worsening overall and I do believe that the patient is making good headway towards closure. Patient's wound unfortunately at the top of the wrap appears to be still causing him problems despite the cream that we have been using over the past week.  We are going to try an oral antibiotic at this time. Electronic Signature(s) Signed: 11/09/2022 4:23:36 PM By: Allen Derry PA-C Entered By: Allen Derry on 11/09/2022 13:23:36 -------------------------------------------------------------------------------- Physical Exam Details Patient Name: Date of Service: JAISEAN, KUSNIERZ D. 11/09/2022 2:45 PM Medical Record Number: [id number] Patient Account Number: [id number] Date of Birth/Sex: Treating RN: [birth year] (67 y.o. M) Primary Care Provider: [name] Other Clinician: Referring Provider: Treating Provider/Extender: Alver Fisher in Treatment: 81 Constitutional Well-nourished and well-hydrated in no acute distress. Respiratory normal breathing without difficulty. Psychiatric this patient is able to make decisions and demonstrates good insight into disease process. Alert and Oriented x 3. pleasant and cooperative. Notes Upon inspection patient's wound bed actually showed signs of good granulation and epithelization at this point. Fortunately I do not see any signs of worsening overall and I do believe that the patient is making headway towards closure. Electronic Signature(s) Signed: 11/09/2022 4:23:52 PM By: Allen Derry PA-C Entered By: Allen Derry on 11/09/2022 13:23:52 Miano, [name] ([id number]) [id number] Page 6 of 13 -------------------------------------------------------------------------------- Physician Orders Details Patient Name: Date of Service: DENARIO, BAIK 11/09/2022 2:45 PM Medical Record Number: [id number] Patient Account Number: [id number] Date of Birth/Sex: Treating RN: 08/24/1955 (67 y.o. Charlean Merl, [name] Primary Care Provider: [name] Other Clinician: Referring Provider: Treating Provider/Extender: Alver Fisher in Treatment: 63 Verbal / Phone Orders: No Diagnosis Coding ICD-10 Coding Code Description I89.0 Lymphedema, not  elsewhere classified I87.333 Chronic venous hypertension (idiopathic) with ulcer and inflammation of bilateral lower extremity L97.828 Non-pressure chronic ulcer of other part of left lower leg with other specified severity L97.818 Non-pressure chronic ulcer of other part of right lower leg with other specified severity E11.621 Type 2 diabetes mellitus with foot ulcer L97.518 Non-pressure chronic ulcer of other part of right foot with other specified severity Follow-up Appointments ppointment in 1 week. Leonard Schwartz Wednesday 11/16/22 @ 2:45 Return A Other: - pick up Bactrim ASAP!! Anesthetic (In clinic) Topical Lidocaine 5% applied to wound bed - Used in Clinic Prior to debridement (In clinic) Topical Lidocaine 4% applied to wound bed Cellular or Tissue Based Products Cellular or Tissue Based Product Type: - Run IVR for Kerrecis- denied #1 Apligraf applied 02/23/2022 #2 Apligraf applied 03/02/2022 #3 Apligraf applied 03/16/2022 #4 Apligraf applied 03/30/2022 HOLD APLIGRAF this week. Cellular or Tissue Based Product applied to wound bed, secured with steri-strips, cover with Adaptic or Mepitel. (DO NOT REMOVE). Bathing/ Shower/ Hygiene May shower with protection but do not get wound dressing(s) wet. Protect dressing(s) with water repellant cover (for example, large plastic bag) or  we will make an progress with regard to his wound each time I see it is get a little bit smaller. 2. I am going to recommend as well that he should continue with the wound care measures as before in that regard using the endoform. 3. With regard to the rash at the top of his leg we can actually try an antibiotic, go ahead and send in a prescription for an oral antibiotic to see if this will be of benefit we will see how things look over the next week. I am going to send in Levaquin as it is showing signs of potential for less interaction than the Bactrim which was my initial choice. We will see patient back for reevaluation in 1 week here in the clinic. If anything worsens or changes patient will contact our office for additional recommendations. Electronic Signature(s) Signed: 11/09/2022 4:26:34 PM By: Allen Derry PA-C Entered By: Allen Derry on 11/09/2022 13:26:34 Dehner, [name] ([id number]) [id number] Page 13 of 13 -------------------------------------------------------------------------------- SuperBill Details Patient Name: Date of Service: OTTIS, TELLMAN 11/09/2022 Medical Record Number: [id number] Patient Account Number: [id number] Date of Birth/Sex: Treating RN: Apr 27, 1955 (67 y.o. Charlean Merl, [name] Primary Care Provider: [name] Other Clinician: Referring Provider: Treating Provider/Extender: Alver Fisher in Treatment: 66 Diagnosis Coding ICD-10 Codes Code Description I89.0 Lymphedema, not elsewhere classified I87.333 Chronic venous hypertension (idiopathic) with ulcer and inflammation of bilateral  lower extremity L97.828 Non-pressure chronic ulcer of other part of left lower leg with other specified severity L97.818 Non-pressure chronic ulcer of other part of right lower leg with other specified severity E11.621 Type 2 diabetes mellitus with foot ulcer L97.518 Non-pressure chronic ulcer of other part of right foot with other specified severity Facility Procedures : 3 CPT4 Code: 5732202 Description: 11042 - DEB SUBQ TISSUE 20 SQ CM/< ICD-10 Diagnosis Description L97.818 Non-pressure chronic ulcer of other part of right lower leg with other specified Modifier: severity Quantity: 1 Physician Procedures : CPT4 Code Description Modifier [id number] 99214 - WC PHYS LEVEL 4 - EST PT 25 ICD-10 Diagnosis Description I89.0 Lymphedema, not elsewhere classified I87.333 Chronic venous hypertension (idiopathic) with ulcer and inflammation of bilateral lower extremity  L97.828 Non-pressure chronic ulcer of other part of left lower leg with other specified severity L97.818 Non-pressure chronic ulcer of other part of right lower leg with other specified severity Quantity: 1 : [id number] 11042 - WC PHYS SUBQ TISS 20 SQ CM ICD-10 Diagnosis Description L97.818 Non-pressure chronic ulcer of other part of right lower leg with other specified severity Quantity: 1 Electronic Signature(s) Signed: 11/09/2022 4:26:52 PM By: Allen Derry PA-C Previous Signature: 11/09/2022 4:22:52 PM Version By: Fonnie Mu RN Entered By: Allen Derry on 11/09/2022 13:26:51  JADARIUS, KULISH ([id number]) [id number] Page 1 of 13 Visit Report for 11/09/2022 Chief Complaint Document Details Patient Name: Date of Service: DENNEY, WILLY 11/09/2022 2:45 PM Medical Record Number: [id number] Patient Account Number: [id number] Date of Birth/Sex: Treating RN: 05-Feb-1955 (67 y.o. M) Primary Care Provider: [name] Other Clinician: Referring Provider: Treating Provider/Extender: Alver Fisher in Treatment: 78 Information Obtained from: Patient Chief Complaint 08/04/2021; patient returns to clinic with bilateral leg wounds as well as areas on the right foot Electronic Signature(s) Signed: 11/09/2022 3:00:27 PM By: Allen Derry PA-C Entered By: Allen Derry on 11/09/2022 12:00:27 -------------------------------------------------------------------------------- Debridement Details Patient Name: Date of Service: Mcclard, [name] 11/09/2022 2:45 PM Medical Record Number: [id number] Patient Account Number: [id number] Date of Birth/Sex: Treating RN: 1955/07/06 (67 y.o. Charlean Merl, [name] Primary Care Provider: [name] Other Clinician: Referring Provider: Treating Provider/Extender: Alver Fisher in Treatment: 66 Debridement Performed for Assessment: Wound #7 Right,Medial Malleolus Performed By: Physician Lenda Kelp, PA The following information was scribed by: Fonnie Mu The information was scribed for: Lenda Kelp Debridement Type: Debridement Severity of Tissue Pre Debridement: Fat layer exposed Level of Consciousness (Pre-procedure): Awake and Alert Pre-procedure Verification/Time Out Yes - 15:51 Taken: Start Time: 15:51 Pain Control: Lidocaine Percent of Wound Bed Debrided: 100% T Area Debrided (cm): otal 2.73 Tissue and other material debrided: Viable, Non-Viable, Slough, Subcutaneous, Slough Level: Skin/Subcutaneous Tissue Debridement Description:  Excisional Instrument: Curette Bleeding: Minimum Hemostasis Achieved: Pressure End Time: 15:51 Procedural Pain: 0 Post Procedural Pain: 0 Response to Treatment: Procedure was tolerated well Level of Consciousness (Post- Awake and Alert procedure): Post Debridement Measurements of Total Wound Length: (cm) 2.9 Width: (cm) 1.2 Depth: (cm) 0.2 Bornemann, Halo D ([id number]) [id number] Page 2 of 13 Volume: (cm) 0.547 Character of Wound/Ulcer Post Debridement: Improved Severity of Tissue Post Debridement: Fat layer exposed Post Procedure Diagnosis Same as Pre-procedure Electronic Signature(s) Signed: 11/09/2022 4:22:52 PM By: Fonnie Mu RN Signed: 11/09/2022 4:33:40 PM By: Allen Derry PA-C Entered By: Fonnie Mu on 11/09/2022 12:54:32 -------------------------------------------------------------------------------- HPI Details Patient Name: Date of Service: Dupuy, [name] 11/09/2022 2:45 PM Medical Record Number: [id number] Patient Account Number: [id number] Date of Birth/Sex: Treating RN: 01/01/1956 (67 y.o. M) Primary Care Provider: [name] Other Clinician: Referring Provider: Treating Provider/Extender: Alver Fisher in Treatment: 3 History of Present Illness HPI Description: ADMISSION 03/22/2021 This is a 68 year old man with a past medical history significant for diabetes type 2, congestive heart failure, peripheral arterial disease, morbid obesity, venous insufficiency, and coronary artery disease. He has been followed by Dr. Loreta Ave in podiatry, who performed a transmetatarsal amputation on the left foot in August 2022. He had issues healing that wound, but based upon Dr. Kenna Gilbert notes, ultimately the TMA wound healed. During his recovery from that surgery, however, ulcers opened up over the DIP joint of the right second and third toe. These have apparently closed and reopened multiple times. It sounds like  one of the issues has been moisture accumulation and maceration of the tissues causing them to reopen. At his last visit with Dr. Loreta Ave, on March 01, 2021, there continues to be problems with moisture and he was referred to wound care for further evaluation and management. He had a formal aortogram with runoff performed prior to his TMA. The findings are copied here: Patient has inline flow to both feet with no significant flow-limiting lesion that would be amenable to percutaneous or open revascularization.  or diarrhea. 11-02-2022 upon evaluation today patient appears to be doing great in regard to his wound although he does have an area at the top of the wrap which sometimes gets moist which to be honest he actually appears to have a little bit of a fungal infection at this point. I think that we may need to treat this he can get to it and put some cream in there he actually does that on his own UA with some CeraVe. 11-09-2022 upon evaluation today patient's wound bed actually showed signs of good granulation epithelization at this point. Fortunately I do not see any signs of worsening overall and I do believe that the patient is making good headway towards closure. Patient's wound unfortunately at the top of the wrap appears to be still causing him problems despite the cream that we have been using over the past week.  We are going to try an oral antibiotic at this time. Electronic Signature(s) Signed: 11/09/2022 4:23:36 PM By: Allen Derry PA-C Entered By: Allen Derry on 11/09/2022 13:23:36 -------------------------------------------------------------------------------- Physical Exam Details Patient Name: Date of Service: JAISEAN, KUSNIERZ D. 11/09/2022 2:45 PM Medical Record Number: [id number] Patient Account Number: [id number] Date of Birth/Sex: Treating RN: [birth year] (67 y.o. M) Primary Care Provider: [name] Other Clinician: Referring Provider: Treating Provider/Extender: Alver Fisher in Treatment: 81 Constitutional Well-nourished and well-hydrated in no acute distress. Respiratory normal breathing without difficulty. Psychiatric this patient is able to make decisions and demonstrates good insight into disease process. Alert and Oriented x 3. pleasant and cooperative. Notes Upon inspection patient's wound bed actually showed signs of good granulation and epithelization at this point. Fortunately I do not see any signs of worsening overall and I do believe that the patient is making headway towards closure. Electronic Signature(s) Signed: 11/09/2022 4:23:52 PM By: Allen Derry PA-C Entered By: Allen Derry on 11/09/2022 13:23:52 Miano, [name] ([id number]) [id number] Page 6 of 13 -------------------------------------------------------------------------------- Physician Orders Details Patient Name: Date of Service: DENARIO, BAIK 11/09/2022 2:45 PM Medical Record Number: [id number] Patient Account Number: [id number] Date of Birth/Sex: Treating RN: 08/24/1955 (67 y.o. Charlean Merl, [name] Primary Care Provider: [name] Other Clinician: Referring Provider: Treating Provider/Extender: Alver Fisher in Treatment: 63 Verbal / Phone Orders: No Diagnosis Coding ICD-10 Coding Code Description I89.0 Lymphedema, not  elsewhere classified I87.333 Chronic venous hypertension (idiopathic) with ulcer and inflammation of bilateral lower extremity L97.828 Non-pressure chronic ulcer of other part of left lower leg with other specified severity L97.818 Non-pressure chronic ulcer of other part of right lower leg with other specified severity E11.621 Type 2 diabetes mellitus with foot ulcer L97.518 Non-pressure chronic ulcer of other part of right foot with other specified severity Follow-up Appointments ppointment in 1 week. Leonard Schwartz Wednesday 11/16/22 @ 2:45 Return A Other: - pick up Bactrim ASAP!! Anesthetic (In clinic) Topical Lidocaine 5% applied to wound bed - Used in Clinic Prior to debridement (In clinic) Topical Lidocaine 4% applied to wound bed Cellular or Tissue Based Products Cellular or Tissue Based Product Type: - Run IVR for Kerrecis- denied #1 Apligraf applied 02/23/2022 #2 Apligraf applied 03/02/2022 #3 Apligraf applied 03/16/2022 #4 Apligraf applied 03/30/2022 HOLD APLIGRAF this week. Cellular or Tissue Based Product applied to wound bed, secured with steri-strips, cover with Adaptic or Mepitel. (DO NOT REMOVE). Bathing/ Shower/ Hygiene May shower with protection but do not get wound dressing(s) wet. Protect dressing(s) with water repellant cover (for example, large plastic bag) or

## 2022-11-10 NOTE — Progress Notes (Signed)
Service: DUSTIN, GOETTE 11/09/2022 2:45 PM Medical Record Number: [id number] Patient Account Number: [id number] Date of Birth/Sex: Treating RN: 1956-01-04 (67 y.o. [name], [name] Primary Care Tanyah Debruyne: [name] Other Clinician: Referring Kimo Bancroft: Treating Neomia Herbel/Extender: Alver Fisher in Treatment: 66 Multidisciplinary Care Plan reviewed with physician Active Inactive Pain, Acute or Chronic Nursing Diagnoses: Pain, acute or chronic: actual or potential Potential alteration in comfort, pain Goals: Patient will verbalize adequate pain control and receive pain control interventions during procedures as needed Date Initiated: 08/04/2021 Target Resolution Date: 01/10/2023 Goal Status: Active Patient/caregiver will verbalize comfort level met Date Initiated: 08/04/2021 Date Inactivated: 07/27/2022 Target Resolution Date: 09/10/2022 Goal Status: Met Interventions: Encourage patient to take pain medications as prescribed Provide education on pain management Reposition patient for comfort Treatment Activities: Administer pain control  measures as ordered : 08/04/2021 Notes: Electronic Signature(s) Signed: 11/09/2022 4:22:52 PM By: Fonnie Mu RN Entered By: Fonnie Mu on 11/09/2022 12:25:48 -------------------------------------------------------------------------------- Pain Assessment Details Patient Name: Date of Service: [name], [name] [name] Neptune D. 11/09/2022 2:45 PM Medical Record Number: [id number] Patient Account Number: [id number] Date of Birth/Sex: Treating RN: [birth year] (67 y.o. M) Primary Care Charnae Lill: [name] Other Clinician: Referring Ketina Mars: Treating Rimas Gilham/Extender: Alver Fisher in Treatment: 66 Active Problems Location of Pain Severity and Description of Pain Patient Has Paino No Site Locations [place], [place] D ([id number]) [id number] Page 4 of 6 Pain Management and Medication Current Pain Management: Electronic Signature(s) Signed: 11/09/2022 4:40:46 PM By: Thayer Dallas Entered By: Thayer Dallas on 11/09/2022 12:26:02 -------------------------------------------------------------------------------- Patient/Caregiver Education Details Patient Name: Date of Service: [name] 10/30/2024andnbsp2:45 PM Medical Record Number: [id number] Patient Account Number: [id number] Date of Birth/Gender: Treating RN: [birth year] (67 y.o. [name] Primary Care Physician: [name] Other Clinician: Referring Physician: Treating Physician/Extender: Alver Fisher in Treatment: 63 Education Assessment Education Provided To: Patient Education Topics Provided Wound/Skin Impairment: Methods: Explain/Verbal Responses: Reinforcements needed, State content correctly [employer]) Signed: 11/09/2022 4:22:52 PM By: Fonnie Mu RN Entered By: Fonnie Mu on 11/09/2022 12:26:00 -------------------------------------------------------------------------------- Wound Assessment Details Patient  Name: Date of Service: [name], [name] [name] Neptune D. 11/09/2022 2:45 PM Medical Record Number: [id number] Patient Account Number: [id number] Date of Birth/Sex: Treating RN: 1955-03-31 (67 y.o. M) Primary Care Munachimso Palin: [name] Other Clinician: Referring Michaela Broski: Treating Nicosha Struve/Extender: Alric Quan [place], [place] D ([id number]) [id number] Page 5 of 6 Weeks in Treatment: 66 Wound Status Wound Number: 7 Primary Venous Leg Ulcer Etiology: Wound Location: Right, Medial Malleolus Secondary Lymphedema Wounding Event: Blister Etiology: Date Acquired: 07/21/2021 Wound Open Weeks Of Treatment: 66 Status: Clustered Wound: No Comorbid Sleep Apnea, Arrhythmia, Congestive Heart Failure, History: Hypertension, Hypotension, Peripheral Arterial Disease, Peripheral Venous Disease, Type II Diabetes, Gout, Osteoarthritis Photos Wound Measurements Length: (cm) Width: (cm) Depth: (cm) Area: (cm) Volume: (cm) 2.9 % Reduction in Area: 92.9% 1.2 % Reduction in Volume: 98% 0.2 Epithelialization: Medium (34-66%) 2.733 Tunneling: No 0.547 Undermining: No Wound Description Classification: Full Thickness With Exposed Supp Wound Margin: Distinct, outline attached Exudate Amount: Medium Exudate Type: Serosanguineous Exudate Color: red, brown ort Structures Foul Odor After Cleansing: No Slough/Fibrino Yes Wound Bed Granulation Amount: Large (67-100%) Exposed Structure Granulation Quality: Red Fascia Exposed: No Necrotic Amount: Small (1-33%) Fat Layer (Subcutaneous Tissue) Exposed: Yes Necrotic Quality: Adherent Slough Tendon Exposed: No Muscle Exposed: No Joint Exposed: No Bone Exposed: No Periwound Skin Texture Texture Color No Abnormalities Noted: No No Abnormalities Noted: No Callus: Yes Atrophie Blanche: No Crepitus:  Service: DUSTIN, GOETTE 11/09/2022 2:45 PM Medical Record Number: [id number] Patient Account Number: [id number] Date of Birth/Sex: Treating RN: 1956-01-04 (67 y.o. [name], [name] Primary Care Tanyah Debruyne: [name] Other Clinician: Referring Kimo Bancroft: Treating Neomia Herbel/Extender: Alver Fisher in Treatment: 66 Multidisciplinary Care Plan reviewed with physician Active Inactive Pain, Acute or Chronic Nursing Diagnoses: Pain, acute or chronic: actual or potential Potential alteration in comfort, pain Goals: Patient will verbalize adequate pain control and receive pain control interventions during procedures as needed Date Initiated: 08/04/2021 Target Resolution Date: 01/10/2023 Goal Status: Active Patient/caregiver will verbalize comfort level met Date Initiated: 08/04/2021 Date Inactivated: 07/27/2022 Target Resolution Date: 09/10/2022 Goal Status: Met Interventions: Encourage patient to take pain medications as prescribed Provide education on pain management Reposition patient for comfort Treatment Activities: Administer pain control  measures as ordered : 08/04/2021 Notes: Electronic Signature(s) Signed: 11/09/2022 4:22:52 PM By: Fonnie Mu RN Entered By: Fonnie Mu on 11/09/2022 12:25:48 -------------------------------------------------------------------------------- Pain Assessment Details Patient Name: Date of Service: [name], [name] [name] Neptune D. 11/09/2022 2:45 PM Medical Record Number: [id number] Patient Account Number: [id number] Date of Birth/Sex: Treating RN: [birth year] (67 y.o. M) Primary Care Charnae Lill: [name] Other Clinician: Referring Ketina Mars: Treating Rimas Gilham/Extender: Alver Fisher in Treatment: 66 Active Problems Location of Pain Severity and Description of Pain Patient Has Paino No Site Locations [place], [place] D ([id number]) [id number] Page 4 of 6 Pain Management and Medication Current Pain Management: Electronic Signature(s) Signed: 11/09/2022 4:40:46 PM By: Thayer Dallas Entered By: Thayer Dallas on 11/09/2022 12:26:02 -------------------------------------------------------------------------------- Patient/Caregiver Education Details Patient Name: Date of Service: [name] 10/30/2024andnbsp2:45 PM Medical Record Number: [id number] Patient Account Number: [id number] Date of Birth/Gender: Treating RN: [birth year] (67 y.o. [name] Primary Care Physician: [name] Other Clinician: Referring Physician: Treating Physician/Extender: Alver Fisher in Treatment: 63 Education Assessment Education Provided To: Patient Education Topics Provided Wound/Skin Impairment: Methods: Explain/Verbal Responses: Reinforcements needed, State content correctly [employer]) Signed: 11/09/2022 4:22:52 PM By: Fonnie Mu RN Entered By: Fonnie Mu on 11/09/2022 12:26:00 -------------------------------------------------------------------------------- Wound Assessment Details Patient  Name: Date of Service: [name], [name] [name] Neptune D. 11/09/2022 2:45 PM Medical Record Number: [id number] Patient Account Number: [id number] Date of Birth/Sex: Treating RN: 1955-03-31 (67 y.o. M) Primary Care Munachimso Palin: [name] Other Clinician: Referring Michaela Broski: Treating Nicosha Struve/Extender: Alric Quan [place], [place] D ([id number]) [id number] Page 5 of 6 Weeks in Treatment: 66 Wound Status Wound Number: 7 Primary Venous Leg Ulcer Etiology: Wound Location: Right, Medial Malleolus Secondary Lymphedema Wounding Event: Blister Etiology: Date Acquired: 07/21/2021 Wound Open Weeks Of Treatment: 66 Status: Clustered Wound: No Comorbid Sleep Apnea, Arrhythmia, Congestive Heart Failure, History: Hypertension, Hypotension, Peripheral Arterial Disease, Peripheral Venous Disease, Type II Diabetes, Gout, Osteoarthritis Photos Wound Measurements Length: (cm) Width: (cm) Depth: (cm) Area: (cm) Volume: (cm) 2.9 % Reduction in Area: 92.9% 1.2 % Reduction in Volume: 98% 0.2 Epithelialization: Medium (34-66%) 2.733 Tunneling: No 0.547 Undermining: No Wound Description Classification: Full Thickness With Exposed Supp Wound Margin: Distinct, outline attached Exudate Amount: Medium Exudate Type: Serosanguineous Exudate Color: red, brown ort Structures Foul Odor After Cleansing: No Slough/Fibrino Yes Wound Bed Granulation Amount: Large (67-100%) Exposed Structure Granulation Quality: Red Fascia Exposed: No Necrotic Amount: Small (1-33%) Fat Layer (Subcutaneous Tissue) Exposed: Yes Necrotic Quality: Adherent Slough Tendon Exposed: No Muscle Exposed: No Joint Exposed: No Bone Exposed: No Periwound Skin Texture Texture Color No Abnormalities Noted: No No Abnormalities Noted: No Callus: Yes Atrophie Blanche: No Crepitus:  No Cyanosis: No Excoriation: No Ecchymosis: No Induration: No Erythema: No Rash: No Hemosiderin Staining: No Scarring:  No Mottled: No Pallor: No Moisture Rubor: No No Abnormalities Noted: No Dry / Scaly: Yes Temperature / Pain Maceration: No Temperature: No Abnormality Treatment Notes Wound #7 (Malleolus) Wound Laterality: Right, Medial Cleanser Soap and Water Discharge Instruction: May shower and wash wound with dial antibacterial soap and water prior to dressing change. Vashe 5.8 (oz) Discharge Instruction: Cleanse the wound with Vashe prior to applying a clean dressing using gauze sponges, not tissue or cotton balls. GEOVONNIE, KOONZ ([id number]) [id number] Page 6 of 6 Peri-Wound Care Sween Lotion (Moisturizing lotion) Discharge Instruction: Apply moisturizing lotion as directed Topical Gentamicin Discharge Instruction: As directed by physician Primary Dressing Endoform 2x2 in Discharge Instruction: Moisten with saline Secondary Dressing CarboFLEX Odor Control Dressing, 4x4 in Discharge Instruction: Apply over primary dressing as directed. Drawtex 4x4 in Discharge Instruction: Apply over primary dressing as directed. Zetuvit Plus 4x8 in Discharge Instruction: Apply over primary dressing as directed. Secured With Compression Wrap Urgo K2, (equivalent to a 4 layer) two layer compression system, regular Discharge Instruction: Apply Urgo K2 as directed (alternative to 4 layer compression). Compression Stockings Add-Ons Electronic Signature(s) Signed: 11/09/2022 4:40:46 PM By: Thayer Dallas Entered By: Thayer Dallas on 11/09/2022 12:28:00 -------------------------------------------------------------------------------- Vitals Details Patient Name: Date of Service: [name], [name] 11/09/2022 2:45 PM Medical Record Number: [id number] Patient Account Number: [id number] Date of Birth/Sex: Treating RN: March 17, 1955 (67 y.o. M) Primary Care Ayushi Pla: [name] Other Clinician: Referring Quatavious Rossa: Treating Rakeem Colley/Extender: Alver Fisher in  Treatment: 66 Vital Signs Time Taken: 15:25 Temperature (F): 98.5 Height (in): 71 Pulse (bpm): 72 Weight (lbs): 350 Respiratory Rate (breaths/min): 18 Body Mass Index (BMI): 48.8 Blood Pressure (mmHg): 141/86 Reference Range: 80 - 120 mg / dl Electronic Signature(s) Signed: 11/09/2022 4:40:46 PM By: Thayer Dallas Entered By: Thayer Dallas on 11/09/2022 12:25:51

## 2022-11-15 ENCOUNTER — Other Ambulatory Visit (HOSPITAL_COMMUNITY): Payer: Self-pay | Admitting: Internal Medicine

## 2022-11-15 DIAGNOSIS — I4821 Permanent atrial fibrillation: Secondary | ICD-10-CM

## 2022-11-16 ENCOUNTER — Encounter (HOSPITAL_BASED_OUTPATIENT_CLINIC_OR_DEPARTMENT_OTHER): Payer: Medicare Other | Attending: Physician Assistant | Admitting: Internal Medicine

## 2022-11-16 DIAGNOSIS — L97518 Non-pressure chronic ulcer of other part of right foot with other specified severity: Secondary | ICD-10-CM | POA: Diagnosis not present

## 2022-11-16 DIAGNOSIS — Z7901 Long term (current) use of anticoagulants: Secondary | ICD-10-CM | POA: Insufficient documentation

## 2022-11-16 DIAGNOSIS — I2510 Atherosclerotic heart disease of native coronary artery without angina pectoris: Secondary | ICD-10-CM | POA: Diagnosis not present

## 2022-11-16 DIAGNOSIS — I110 Hypertensive heart disease with heart failure: Secondary | ICD-10-CM | POA: Diagnosis not present

## 2022-11-16 DIAGNOSIS — I890 Lymphedema, not elsewhere classified: Secondary | ICD-10-CM | POA: Diagnosis not present

## 2022-11-16 DIAGNOSIS — E1151 Type 2 diabetes mellitus with diabetic peripheral angiopathy without gangrene: Secondary | ICD-10-CM | POA: Diagnosis not present

## 2022-11-16 DIAGNOSIS — I509 Heart failure, unspecified: Secondary | ICD-10-CM | POA: Diagnosis not present

## 2022-11-16 DIAGNOSIS — I872 Venous insufficiency (chronic) (peripheral): Secondary | ICD-10-CM | POA: Diagnosis not present

## 2022-11-16 DIAGNOSIS — L97818 Non-pressure chronic ulcer of other part of right lower leg with other specified severity: Secondary | ICD-10-CM | POA: Insufficient documentation

## 2022-11-16 DIAGNOSIS — L97828 Non-pressure chronic ulcer of other part of left lower leg with other specified severity: Secondary | ICD-10-CM | POA: Diagnosis not present

## 2022-11-16 DIAGNOSIS — E11621 Type 2 diabetes mellitus with foot ulcer: Secondary | ICD-10-CM | POA: Diagnosis not present

## 2022-11-16 DIAGNOSIS — I87333 Chronic venous hypertension (idiopathic) with ulcer and inflammation of bilateral lower extremity: Secondary | ICD-10-CM | POA: Diagnosis not present

## 2022-11-18 NOTE — Progress Notes (Signed)
MORDECHAI, WEISE ([id number]) [id number] Page 1 of 1 Visit Report for 11/16/2022 SuperBill Details Patient Name: Date of Service: [name], [name] 11/16/2022 Medical Record Number: [id number] Patient Account Number: [id number] Date of Birth/Sex: Treating RN: [birth year] (67 y.o. M) Primary Care Provider: [name] Other Clinician: Referring Provider: Treating Provider/Extender: Mikki Santee in Treatment: 67 Diagnosis Coding ICD-10 Codes Code Description I89.0 Lymphedema, not elsewhere classified I87.333 Chronic venous hypertension (idiopathic) with ulcer and inflammation of bilateral lower extremity L97.828 Non-pressure chronic ulcer of other part of left lower leg with other specified severity L97.818 Non-pressure chronic ulcer of other part of right lower leg with other specified severity E11.621 Type 2 diabetes mellitus with foot ulcer L97.518 Non-pressure chronic ulcer of other part of right foot with other specified severity Facility Procedures CPT4 Code Description Modifier Quantity [id number] (Facility Use Only) [phone] - APPLY MULTLAY COMPRS LWR RT LEG 1 Electronic Signature(s) Signed: 11/16/2022 4:54:50 PM By: Baltazar Najjar MD Signed: 11/18/2022 12:52:25 PM By: Thayer Dallas Entered By: Thayer Dallas on 11/16/2022 15:26:32

## 2022-11-18 NOTE — Progress Notes (Signed)
DREVEN, ALTERI ([id number]) [id number] Page 1 of 4 Visit Report for 11/16/2022 Arrival Information Details Patient Name: Date of Service: [name], [name] 11/16/2022 2:45 PM Medical Record Number: [id number] Patient Account Number: [id number] Date of Birth/Sex: Treating RN: 15-Apr-1955 (67 y.o. M) Primary Care Mariyana Fulop: [name] Other Clinician: Referring Koya Hunger: Treating Samuel Mcpeek/Extender: Mikki Santee in Treatment: 67 Visit Information History Since Last Visit Added or deleted any medications: No Patient Arrived: [name] Any new allergies or adverse reactions: No Arrival Time: 14:54 Had a fall or experienced change in No Accompanied By: self activities of daily living that may affect Transfer Assistance: None risk of falls: Patient Identification Verified: Yes Signs or symptoms of abuse/neglect since last visito No Secondary Verification Process Completed: Yes Hospitalized since last visit: No Patient Requires Transmission-Based Precautions: No Implantable device outside of the clinic excluding No Patient Has Alerts: Yes cellular tissue based products placed in the center Patient Alerts: Patient on Blood Thinner since last visit: 01/2021 ABI L 1.13 R 1.06 Has Dressing in Place as Prescribed: Yes 01/2021 TBI L amp R 0.8 Has Compression in Place as Prescribed: Yes Pain Present Now: No Electronic Signature(s) Signed: 11/18/2022 12:52:25 PM By: Thayer Dallas Entered By: Thayer Dallas on 11/16/2022 14:54:58 -------------------------------------------------------------------------------- Compression Therapy Details Patient Name: Date of Service: [name] Craven D. 11/16/2022 2:45 PM Medical Record Number: [id number] Patient Account Number: [id number] Date of Birth/Sex: Treating RN: [name], 1955 (67 y.o. M) Primary Care Denys Salinger: [name] Other Clinician: Referring Deberah Adolf: Treating Abdikadir Fohl/Extender: Mikki Santee in Treatment: 66 Compression Therapy Performed for Wound Assessment: Wound #7 Right,Medial Malleolus Performed By: Clinician Thayer Dallas, Compression Type: Double Layer Electronic Signature(s) Signed: 11/18/2022 12:52:25 PM By: Thayer Dallas Entered By: Thayer Dallas on 11/16/2022 15:25:23 -------------------------------------------------------------------------------- Encounter Discharge Information Details Patient Name: Date of Service: [name], [name] 11/16/2022 2:45 PM Medical Record Number: [id number] Patient Account Number: [id number] RABIH, PASQUAL ([id number]) [id number] Page 2 of 4 Date of Birth/Sex: Treating RN: 11-Apr-1955 (67 y.o. M) Primary Care Delitha Elms: Other Clinician: Maximino Sarin Referring Zahria Ding: Treating Juris Gosnell/Extender: Mikki Santee in Treatment: 89 Encounter Discharge Information Items Discharge Condition: Stable Ambulatory Status: Walker Discharge Destination: Home Transportation: Private Auto Accompanied By: self Schedule Follow-up Appointment: Yes Clinical Summary of Care: Electronic Signature(s) Signed: 11/18/2022 12:52:25 PM By: Thayer Dallas Entered By: Thayer Dallas on 11/16/2022 15:26:12 -------------------------------------------------------------------------------- Patient/Caregiver Education Details Patient Name: Date of Service: [name] Craven D. 11/6/2024andnbsp2:45 PM Medical Record Number: [id number] Patient Account Number: [id number] Date of Birth/Gender: Treating RN: 06/27/1955 (67 y.o. M) Primary Care Physician: [name] Other Clinician: Thayer Dallas Referring Physician: Treating Physician/Extender: Mikki Santee in Treatment: 42 Education Assessment Education Provided To: Patient Education Topics Provided Electronic Signature(s) Signed: 11/18/2022 12:52:25 PM By: Thayer Dallas Entered By: Thayer Dallas on 11/16/2022  15:25:50 -------------------------------------------------------------------------------- Wound Assessment Details Patient Name: Date of Service: [name], [name] Grooms D. 11/16/2022 2:45 PM Medical Record Number: [id number] Patient Account Number: [id number] Date of Birth/Sex: Treating RN: 1955/09/07 (67 y.o. M) Primary Care Zamya Culhane: [name] Other Clinician: Referring Etai Copado: Treating Jensyn Shave/Extender: Mikki Santee in Treatment: 67 Wound Status Wound Number: 7 Primary Venous Leg Ulcer Etiology: Wound Location: Right, Medial Malleolus Secondary Lymphedema Wounding Event: Blister Etiology: Date Acquired: 07/21/2021 Wound Open Weeks Of Treatment: 67 Status: Clustered Wound: No Comorbid Sleep Apnea, Arrhythmia, Congestive Heart Failure, History: Hypertension, Hypotension, Peripheral Arterial Disease, Peripheral Venous Disease, Type II Diabetes, Gout, Osteoarthritis [name], [name] (  [id number]) [id number] Page 3 of 4 Wound Measurements Length: (cm) 2.9 Width: (cm) 1.2 Depth: (cm) 0.2 Area: (cm) 2.733 Volume: (cm) 0.547 % Reduction in Area: 92.9% % Reduction in Volume: 98% Epithelialization: Medium (34-66%) Wound Description Classification: Full Thickness With Exposed Suppo Wound Margin: Distinct, outline attached Exudate Amount: Medium Exudate Type: Serosanguineous Exudate Color: red, brown rt Structures Foul Odor After Cleansing: No Slough/Fibrino Yes Wound Bed Granulation Amount: Large (67-100%) Exposed Structure Granulation Quality: Red Fascia Exposed: No Necrotic Amount: Small (1-33%) Fat Layer (Subcutaneous Tissue) Exposed: Yes Necrotic Quality: Adherent Slough Tendon Exposed: No Muscle Exposed: No Joint Exposed: No Bone Exposed: No Periwound Skin Texture Texture Color No Abnormalities Noted: No No Abnormalities Noted: No Callus: Yes Atrophie Blanche: No Crepitus: No Cyanosis: No Excoriation: No Ecchymosis:  No Induration: No Erythema: No Rash: No Hemosiderin Staining: No Scarring: No Mottled: No Pallor: No Moisture Rubor: No No Abnormalities Noted: No Dry / Scaly: Yes Temperature / Pain Maceration: No Temperature: No Abnormality Treatment Notes Wound #7 (Malleolus) Wound Laterality: Right, Medial Cleanser Soap and Water Discharge Instruction: May shower and wash wound with dial antibacterial soap and water prior to dressing change. Vashe 5.8 (oz) Discharge Instruction: Cleanse the wound with Vashe prior to applying a clean dressing using gauze sponges, not tissue or cotton balls. Peri-Wound Care Sween Lotion (Moisturizing lotion) Discharge Instruction: Apply moisturizing lotion as directed Topical Gentamicin Discharge Instruction: As directed by physician Primary Dressing Endoform 2x2 in Discharge Instruction: Moisten with saline Secondary Dressing Drawtex 4x4 in Discharge Instruction: Apply over primary dressing as directed. Zetuvit Plus 4x8 in Discharge Instruction: Apply over primary dressing as directed. Secured With Compression Wrap Urgo K2, (equivalent to a 4 layer) two layer compression system, regular Discharge Instruction: Apply Urgo K2 as directed (alternative to 4 layer compression). Compression Stockings [name], [name] ([id number]) [id number] Page 4 of 4 Add-Ons Electronic Signature(s) Signed: 11/18/2022 12:52:25 PM By: Thayer Dallas Entered By: Thayer Dallas on 11/16/2022 14:55:32 -------------------------------------------------------------------------------- Vitals Details Patient Name: Date of Service: [name], [name] 11/16/2022 2:45 PM Medical Record Number: [id number] Patient Account Number: [id number] Date of Birth/Sex: Treating RN: 07/25/1955 (67 y.o. M) Primary Care Oaklan Persons: [name] Other Clinician: Referring Sonna Lipsky: Treating Kaylan Yates/Extender: Mikki Santee in Treatment: 67 Vital  Signs Time Taken: 14:55 Reference Range: 80 - 120 mg / dl Height (in): 71 Weight (lbs): 350 Body Mass Index (BMI): 48.8 Electronic Signature(s) Signed: 11/18/2022 12:52:25 PM By: Thayer Dallas Entered By: Thayer Dallas on 11/16/2022 14:55:11

## 2022-11-21 ENCOUNTER — Other Ambulatory Visit (HOSPITAL_COMMUNITY): Payer: Self-pay

## 2022-11-21 ENCOUNTER — Encounter: Payer: Self-pay | Admitting: Internal Medicine

## 2022-11-21 DIAGNOSIS — Z125 Encounter for screening for malignant neoplasm of prostate: Secondary | ICD-10-CM | POA: Diagnosis not present

## 2022-11-21 DIAGNOSIS — I110 Hypertensive heart disease with heart failure: Secondary | ICD-10-CM | POA: Diagnosis not present

## 2022-11-21 DIAGNOSIS — E1151 Type 2 diabetes mellitus with diabetic peripheral angiopathy without gangrene: Secondary | ICD-10-CM | POA: Diagnosis not present

## 2022-11-21 DIAGNOSIS — M109 Gout, unspecified: Secondary | ICD-10-CM | POA: Diagnosis not present

## 2022-11-22 LAB — LAB REPORT - SCANNED
A1c: 5.9
EGFR: 60.4
PSA, Total: 0.5

## 2022-11-23 ENCOUNTER — Telehealth (HOSPITAL_COMMUNITY): Payer: Self-pay

## 2022-11-23 ENCOUNTER — Encounter (HOSPITAL_BASED_OUTPATIENT_CLINIC_OR_DEPARTMENT_OTHER): Payer: Medicare Other | Admitting: Internal Medicine

## 2022-11-23 DIAGNOSIS — I872 Venous insufficiency (chronic) (peripheral): Secondary | ICD-10-CM | POA: Diagnosis not present

## 2022-11-23 DIAGNOSIS — L97518 Non-pressure chronic ulcer of other part of right foot with other specified severity: Secondary | ICD-10-CM | POA: Diagnosis not present

## 2022-11-23 DIAGNOSIS — I890 Lymphedema, not elsewhere classified: Secondary | ICD-10-CM | POA: Diagnosis not present

## 2022-11-23 DIAGNOSIS — E1151 Type 2 diabetes mellitus with diabetic peripheral angiopathy without gangrene: Secondary | ICD-10-CM | POA: Diagnosis not present

## 2022-11-23 DIAGNOSIS — Z7901 Long term (current) use of anticoagulants: Secondary | ICD-10-CM | POA: Diagnosis not present

## 2022-11-23 DIAGNOSIS — L97818 Non-pressure chronic ulcer of other part of right lower leg with other specified severity: Secondary | ICD-10-CM | POA: Diagnosis not present

## 2022-11-23 DIAGNOSIS — I509 Heart failure, unspecified: Secondary | ICD-10-CM | POA: Diagnosis not present

## 2022-11-23 DIAGNOSIS — E11621 Type 2 diabetes mellitus with foot ulcer: Secondary | ICD-10-CM | POA: Diagnosis not present

## 2022-11-23 DIAGNOSIS — L97828 Non-pressure chronic ulcer of other part of left lower leg with other specified severity: Secondary | ICD-10-CM | POA: Diagnosis not present

## 2022-11-23 DIAGNOSIS — I110 Hypertensive heart disease with heart failure: Secondary | ICD-10-CM | POA: Diagnosis not present

## 2022-11-23 DIAGNOSIS — I87333 Chronic venous hypertension (idiopathic) with ulcer and inflammation of bilateral lower extremity: Secondary | ICD-10-CM | POA: Diagnosis not present

## 2022-11-23 DIAGNOSIS — I2510 Atherosclerotic heart disease of native coronary artery without angina pectoris: Secondary | ICD-10-CM | POA: Diagnosis not present

## 2022-11-23 NOTE — Progress Notes (Signed)
[name] ([id number]) [id number] Page 1 of 4 Visit Report for 11/23/2022 Arrival Information Details Patient Name: Date of Service: [name], [name] 11/23/2022 2:45 PM Medical Record Number: [id number] Patient Account Number: [id number] Date of Birth/Sex: Treating RN: [name][birth year] (67 y.o. M) Primary Care [name]: [name] Other Clinician: Referring Kathrine Rieves: Treating [name]/Extender: [name] in Treatment: 68 Visit Information History Since Last Visit Added or deleted any medications: No Patient Arrived: [name] Any new allergies or adverse reactions: No Arrival Time: [name]:32 Had a fall or experienced change in No Accompanied By: self activities of daily living that [name] affect Transfer Assistance: None risk of falls: Patient Identification Verified: Yes Signs or symptoms of abuse/neglect since last visito No Secondary Verification Process Completed: Yes Hospitalized since last visit: No Patient Requires Transmission-Based Precautions: No Implantable device outside of the clinic excluding No Patient Has Alerts: Yes cellular tissue based products placed in the center Patient Alerts: Patient on Blood Thinner since last visit: 01/2021 ABI L 1.13 R 1.06 Has Dressing in Place as Prescribed: Yes 01/2021 TBI L amp R 0.8 Has Compression in Place as Prescribed: Yes Pain Present Now: No Electronic Signature(s) Signed: 11/23/2022 4:47:30 PM By: [name] Entered By: [name] on 11/23/2022 [name]:33:08 -------------------------------------------------------------------------------- Compression Therapy Details Patient Name: Date of Service: [name] Craven [name]. 11/23/2022 2:45 PM Medical Record Number: [id number] Patient Account Number: [id number] Date of Birth/Sex: Treating RN: [name], [name] (67 y.o. M) Primary Care Tu Shimmel: [name] Other Clinician: Referring [name]: Treating [name]/Extender: [name] in Treatment: 62 Compression Therapy Performed for Wound Assessment: Wound #7 Right,Medial Malleolus Performed By: Clinician [name], Compression Type: Double Layer Electronic Signature(s) Signed: 11/23/2022 4:47:30 PM By: [name] Entered By: [name] on 11/23/2022 15:05:15 -------------------------------------------------------------------------------- Encounter Discharge Information Details Patient Name: Date of Service: [name], [name] 11/23/2022 2:45 PM Medical Record Number: [id number] Patient Account Number: [id number] [name], [name] ([id number]) [id number] Page 2 of 4 Date of Birth/Sex: Treating RN: [birth year][name] (67 y.o. M) Primary Care [name]: Other Clinician: Maximino [name] Referring Jaliza Seifried: Treating [name]/Extender: [name] in Treatment: 76 Encounter Discharge Information Items Discharge Condition: Stable Ambulatory Status: Walker Discharge Destination: Home Transportation: Private Auto Accompanied By: self Schedule Follow-up Appointment: Yes Clinical Summary of Care: Electronic Signature(s) Signed: 11/23/2022 4:47:30 PM By: [name] Entered By: [name] on 11/23/2022 15:05:59 -------------------------------------------------------------------------------- Patient/Caregiver Education Details Patient Name: Date of Service: [name] Craven [name]. 11/13/2024andnbsp2:45 PM Medical Record Number: [id number] Patient Account Number: [id number] Date of Birth/Gender: Treating RN: [birth year] (67 y.o. M) Primary Care Physician: [name] Other Clinician: Thayer [name] Referring Physician: Treating Physician/Extender: [name] in Treatment: 71 Education Assessment Education Provided To: Patient Education Topics Provided Electronic Signature(s) Signed: 11/23/2022 4:47:30 PM By: [name] Entered By: [name] on 11/23/2022  15:05:40 -------------------------------------------------------------------------------- Wound Assessment Details Patient Name: Date of Service: Rowland, Sinclair Grooms [name]. 11/23/2022 2:45 PM Medical Record Number: [id number] Patient Account Number: [id number] Date of Birth/Sex: Treating RN: [birth year][name] (67 y.o. M) Primary Care Kataleia Quaranta: [name] Other Clinician: Referring Cathi Hazan: Treating Ahana Najera/Extender: [name] in Treatment: 68 Wound Status Wound Number: 7 Primary Etiology: Venous Leg Ulcer Wound Location: Right, Medial Malleolus Secondary Etiology: Lymphedema Wounding Event: Blister Wound Status: Open Date Acquired: 07/21/2021 Weeks Of Treatment: 68 Clustered Wound: No Wound Measurements [name] ([id number]) Length: (cm) 2.9 Width: (cm) 1.2 Depth: (cm) 0.2 Area: (cm) 2.733 Volume: (cm) 0.547 [id number] Page 3  of 4 % Reduction in Area: 92.9% % Reduction in Volume: 98% Wound Description Classification: Full Thickness With Exposed Support Exudate Amount: Medium Exudate Type: Serosanguineous Exudate Color: red, brown Structures Periwound Skin Texture Texture Color No Abnormalities Noted: No No Abnormalities Noted: No Moisture No Abnormalities Noted: No Treatment Notes Wound #7 (Malleolus) Wound Laterality: Right, Medial Cleanser Soap and Water Discharge Instruction: [name] shower and wash wound with dial antibacterial soap and water prior to dressing change. Vashe 5.8 (oz) Discharge Instruction: Cleanse the wound with Vashe prior to applying a clean dressing using gauze sponges, not tissue or cotton balls. Peri-Wound Care Sween Lotion (Moisturizing lotion) Discharge Instruction: Apply moisturizing lotion as directed Topical Gentamicin Discharge Instruction: As directed by physician Primary Dressing Endoform 2x2 in Discharge Instruction: Moisten with saline Secondary Dressing Drawtex 4x4 in Discharge  Instruction: Apply over primary dressing as directed. Zetuvit Plus 4x8 in Discharge Instruction: Apply over primary dressing as directed. Secured With Compression Wrap Urgo K2, (equivalent to a 4 layer) two layer compression system, regular Discharge Instruction: Apply Urgo K2 as directed (alternative to 4 layer compression). Compression Stockings Add-Ons Electronic Signature(s) Signed: 11/23/2022 4:47:30 PM By: [name] Entered By: [name] on 11/23/2022 [name]:33:32 -------------------------------------------------------------------------------- Vitals Details Patient Name: Date of Service: Chavers, [name] 11/23/2022 2:45 PM Medical Record Number: [id number] Patient Account Number: [id number] Date of Birth/Sex: Treating RN: [birth year][name] (67 y.o. KHYDEN, DAVIN [name] ([id number]) [id number] Page 4 of 4 Primary Care Derry Arbogast: [name] Other Clinician: Referring Brigit Doke: Treating Joelee Snoke/Extender: [name] in Treatment: 68 Vital Signs Time Taken: [name]:33 Reference Range: 80 - 120 mg / dl Height (in): 71 Weight (lbs): 350 Body Mass Index (BMI): 48.8 Electronic Signature(s) Signed: 11/23/2022 4:47:30 PM By: [name] Entered By: [name] on 11/23/2022 [name]:33:19

## 2022-11-23 NOTE — Telephone Encounter (Signed)
Patient Advocate Encounter  Medication assistance for Entresto through [employer] is due for renewal. Review of patient chart shows that pt may be eligible for a grant that would cover the copay of Carvedilol, Entresto, Farxiga, Spironolactone.  Attempted to contact patient, I was informed that this pt is 'not in today' and line was disconnected. Will try to contact pt again.  Burnell Blanks, CPhT Rx Patient Advocate Phone: [phone]

## 2022-11-24 NOTE — Progress Notes (Signed)
[name], [name] ([id number]) [id number] Page 1 of 1 Visit Report for 11/23/2022 SuperBill Details Patient Name: Date of Service: [name], [name] 11/23/2022 Medical Record Number: [id number] Patient Account Number: [id number] Date of Birth/Sex: Treating RN: June 15, 1955 (67 y.o. M) Primary Care Provider: [name] Other Clinician: Thayer Dallas Referring Provider: Treating Provider/Extender: Mikki Santee in Treatment: 68 Diagnosis Coding ICD-10 Codes Code Description I89.0 Lymphedema, not elsewhere classified I87.333 Chronic venous hypertension (idiopathic) with ulcer and inflammation of bilateral lower extremity L97.828 Non-pressure chronic ulcer of other part of left lower leg with other specified severity L97.818 Non-pressure chronic ulcer of other part of right lower leg with other specified severity E11.621 Type 2 diabetes mellitus with foot ulcer L97.518 Non-pressure chronic ulcer of other part of right foot with other specified severity Facility Procedures CPT4 Code Description Modifier Quantity [id number] (Facility Use Only) [phone] - APPLY MULTLAY COMPRS LWR RT LEG 1 Electronic Signature(s) Signed: 11/23/2022 4:47:30 PM By: Thayer Dallas Signed: 11/24/2022 4:06:32 PM By: Baltazar Najjar MD Entered By: Thayer Dallas on 11/23/2022 12:06:19

## 2022-11-28 NOTE — Telephone Encounter (Signed)
Second attempt to reach patient regarding medication assistance. Person answering the phone stated that "he is not in, he is already gone," and disconnected the line before I could ask for a better time to contact the patient. Will try to contact patient again.

## 2022-11-30 ENCOUNTER — Encounter (HOSPITAL_BASED_OUTPATIENT_CLINIC_OR_DEPARTMENT_OTHER): Payer: Medicare Other | Admitting: Internal Medicine

## 2022-11-30 DIAGNOSIS — I509 Heart failure, unspecified: Secondary | ICD-10-CM | POA: Diagnosis not present

## 2022-11-30 DIAGNOSIS — L97818 Non-pressure chronic ulcer of other part of right lower leg with other specified severity: Secondary | ICD-10-CM | POA: Diagnosis not present

## 2022-11-30 DIAGNOSIS — Z7901 Long term (current) use of anticoagulants: Secondary | ICD-10-CM | POA: Diagnosis not present

## 2022-11-30 DIAGNOSIS — I2510 Atherosclerotic heart disease of native coronary artery without angina pectoris: Secondary | ICD-10-CM | POA: Diagnosis not present

## 2022-11-30 DIAGNOSIS — E1151 Type 2 diabetes mellitus with diabetic peripheral angiopathy without gangrene: Secondary | ICD-10-CM | POA: Diagnosis not present

## 2022-11-30 DIAGNOSIS — I872 Venous insufficiency (chronic) (peripheral): Secondary | ICD-10-CM | POA: Diagnosis not present

## 2022-11-30 DIAGNOSIS — L97518 Non-pressure chronic ulcer of other part of right foot with other specified severity: Secondary | ICD-10-CM | POA: Diagnosis not present

## 2022-11-30 DIAGNOSIS — I87333 Chronic venous hypertension (idiopathic) with ulcer and inflammation of bilateral lower extremity: Secondary | ICD-10-CM | POA: Diagnosis not present

## 2022-11-30 DIAGNOSIS — I110 Hypertensive heart disease with heart failure: Secondary | ICD-10-CM | POA: Diagnosis not present

## 2022-11-30 DIAGNOSIS — L97312 Non-pressure chronic ulcer of right ankle with fat layer exposed: Secondary | ICD-10-CM | POA: Diagnosis not present

## 2022-11-30 DIAGNOSIS — I890 Lymphedema, not elsewhere classified: Secondary | ICD-10-CM | POA: Diagnosis not present

## 2022-11-30 DIAGNOSIS — E11621 Type 2 diabetes mellitus with foot ulcer: Secondary | ICD-10-CM | POA: Diagnosis not present

## 2022-11-30 DIAGNOSIS — L97828 Non-pressure chronic ulcer of other part of left lower leg with other specified severity: Secondary | ICD-10-CM | POA: Diagnosis not present

## 2022-11-30 DIAGNOSIS — I87331 Chronic venous hypertension (idiopathic) with ulcer and inflammation of right lower extremity: Secondary | ICD-10-CM | POA: Diagnosis not present

## 2022-11-30 NOTE — Progress Notes (Signed)
[name], [name] ([id number]) [id number] Page 1 of 12 Visit Report for 11/30/2022 HPI Details Patient Name: Date of Service: CLARION, [name] 11/30/2022 2:45 PM Medical Record Number: [id number] Patient Account Number: [id number] Date of Birth/Sex: Treating RN: 26-Jan-1955 (67 y.o. M) Primary Care Provider: [name] Other Clinician: Referring Provider: Treating Provider/Extender: Mikki Santee in Treatment: 69 History of Present Illness HPI Description: ADMISSION 03/22/2021 This is a 67 year old man with a past medical history significant for diabetes type 2, congestive heart failure, peripheral arterial disease, morbid obesity, venous insufficiency, and coronary artery disease. He has been followed by Dr. Loreta Ave in podiatry, who performed a transmetatarsal amputation on the left foot in August 2022. He had issues healing that wound, but based upon Dr. Kenna Gilbert notes, ultimately the TMA wound healed. During his recovery from that surgery, however, ulcers opened up over the DIP joint of the right second and third toe. These have apparently closed and reopened multiple times. It sounds like one of the issues has been moisture accumulation and maceration of the tissues causing them to reopen. At his last visit with Dr. Loreta Ave, on March 01, 2021, there continues to be problems with moisture and he was referred to wound care for further evaluation and management. He had a formal aortogram with runoff performed prior to his TMA. The findings are copied here: Patient has inline flow to both feet with no significant flow-limiting lesion that would be amenable to percutaneous or open revascularization. He does have an element of small vessel disease and has a short segment occlusion of the distal anterior tibial/dorsalis pedis artery on the left foot but does have posterior tibial artery flow. Would recommend management of wounds with amputation of  toes 2 and 3 on the right foot if the wounds do not heal and deteriorate. Transmetatarsal amputation on the left side has as good a blood supply as it is going to get and hopefully this will heal in the future. Formal ABIs were done in January 2023. They are normal bilaterally. ABI Findings: +---------+------------------+-----+----------+--------+ Right Rt Pressure (mmHg)IndexWaveform Comment  +---------+------------------+-----+----------+--------+ Brachial 160     +---------+------------------+-----+----------+--------+ PTA 192 1.06 monophasic  +---------+------------------+-----+----------+--------+ DP 159 0.88 monophasic  +---------+------------------+-----+----------+--------+ Great T oe145 0.80    +---------+------------------+-----+----------+--------+ +--------+------------------+-----+---------+-------+ Left Lt Pressure (mmHg)IndexWaveform Comment +--------+------------------+-----+---------+-------+ [id number]     +--------+------------------+-----+---------+-------+ PTA 204 1.13 triphasic  +--------+------------------+-----+---------+-------+ DP 194 1.07 biphasic   +--------+------------------+-----+---------+-------+ +-------+-----------+-----------+------------+------------+ ABI/TBIT oday's ABIT oday's TBIPrevious ABIPrevious TBI +-------+-----------+-----------+------------+------------+ Right 1.06 0.8 1.26 0.65  +-------+-----------+-----------+------------+------------+ Left 1.13 amputation 1.15 amputation  +-------+-----------+-----------+------------+------------+ Previous ABI on 08/06/20 at [facility] Pedal pressures falsely elevated due to medial calcification. Summary: Right: Resting right ankle-brachial index is within normal range. The right toe-brachial index is normal. Left: Resting left ankle-brachial index is within normal range. READMISSION 08/04/2021 JAXXSON, BETHARD D ([id number])  [id number] Page 2 of 12 Mr. [name] is now a 67 year old man who I remember from this clinic many years ago I think he had a right lower extremity predominantly venous wound at the time. He was here for 1 visit in March of this year had wounds on his right second and third toes we apparently dressed them many and they healed so he did not come back. He is listed in [place] is being a diabetic although the patient denies this says he is verified it with his primary doctor. In any case over the last several weeks or so according the patient although these wounds look somewhat more chronic than that he has  developed predominantly large wounds on the right medial and right lateral ankle smaller areas on the left leg and areas on the dorsal aspect of his right second and third toes. Its not clear how he has been dressing these. 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 The patient has known PAD. He had an angiogram in August 2022 at which time he had nonhealing wounds in both feet. On the left his major vessels in the thigh were all patent. He had three-vessel patent to the level of the ankle. He had a very short occlusion in the left anterior tibial. On the right lower extremity the major vessels in his thigh were all patent. He had three-vessel runoff to the foot sluggish filling of the anterior tibial artery. He was felt to have some component of small vessel disease but nothing that was amenable or needed revascularization. It was recommended that he have amputation of the second and third toes on the right foot if they did not heal He has been following with Dr. Loreta Ave of podiatry. Dr. Loreta Ave got him juxta lite stockings although I do not think he had them on properly he has uncontrolled edema in both legs Past medical history includes type 2 diabetes [although the patient really denies this],  left TMA in 2022,lower extremity wounds in fact attendance at this clinic in 2009-2010, A-fib on Eliquis, chronic venous insufficiency with secondary lymphedema history of non-Hodgkin's lymphoma. 08-11-2021 upon evaluation today patient presents for follow-up evaluation he was seen last Wednesday initially for inspection here in our clinic. With that being said he tells me that he unfortunately has been having a lot of drainage and is actually coming through his wrap. Fortunately I do not see any evidence of active infection locally or systemically at this time which is great news. No fevers, chills, nausea, vomiting, or diarrhea. With that being said there does appear to be some evidence of local infection based on what I am seeing today. 08-18-2021 upon evaluation today patient appears to be doing okay currently in regard to his wounds with that being said that he is doing much better but still has a long ways to go to get to where he wanted to be. I think the infection is significantly improved. He has another week of the antibiotic at this point. 08-25-2021 upon evaluation today patient's wounds are actually doing decently well he has erythema has significantly improved. I think the cellulitis is under controlled I am going to continue him on 2 more weeks of the Levaquin at 500 mg this is a lower dose but I am hoping it will be better for him. 09-08-2021 upon evaluation today patient appears to be doing excellent in regard to his wounds. Since I last saw him he was actually in the hospital where he ended up having a pacemaker put in. Subsequently he tells me that he is actually doing quite well although they were unsure whether they were going to do it due to the fact that he had the wounds on his legs. And then I am glad they did anything seem to be doing well. 09-15-2021 upon evaluation patient's wounds are actually showing signs of improvement. The right side wounds do appear to have some need for  sharp debridement today and I Ernie Hew go ahead and proceed with that. I think that if we get the wounds cleaned up he will actually show signs of continued  improvement. I am also leaning towards switching to [facility] which I think will be a much better option for him. 09-22-2021 patient's wounds are showing signs of excellent improvement. I am actually extremely pleased with where we stand and I think that the patient is making great progress. There does not appear to be any signs of active infection. 09-29-2021 upon evaluation today patient appears to be doing excellent in regard to his wounds. He is actually tolerating the dressing changes without complication. Fortunately I see no evidence of active infection locally or systemically at this time which is great news and overall I am extremely pleased with where we stand currently. [id number] upon evaluation today patient actually appears to be doing excellent in regard to his wounds. The left leg is almost completely healed the right ankle is significantly smaller. Overall I am extremely pleased with where we stand at this point. I do not see any evidence of active infection at this time. 10-13-2021 upon evaluation today patient appears to be doing excellent in regard to his wounds. I really feel like he is making good progress here and I am very pleased in that regard. Fortunately I do not see any signs of active infection at this time. We are using the [facility] topical antibiotic therapy. 10-20-2021 upon evaluation today patient appears to be doing well with regard to his wound on the right medial ankle region the left leg is almost completely healed. I am actually very pleased with where we stand today. 11-03-2021 upon evaluation today patient's wound actually is going require some sharp debridement but appears to be doing much better which is great news. Fortunately I do not see any signs of active infection at this time. 11-10-2021 upon evaluation  today patient's wound is actually showing signs of excellent improvement. Fortunately I do not see any evidence of infection locally or systemically which is great news and overall I am extremely pleased with where we stand today. I do believe he is making good progress he does have his [facility] topical antibiotics with him here today. 11-24-2021 upon evaluation today patient actually showing signs of excellent improvement this appears to be doing much better. Fortunately I do not see any evidence of infection locally or systemically at this time. 12-01-2021 upon evaluation today patient appears to be doing well currently in regard to his wound. He has been tolerating the dressing changes without complication. Fortunately I do not see any evidence of active infection at this time which is great news and overall I am extremely pleased with where we stand today. 12-15-2021 upon evaluation today patient appears to be doing well with regard to his wound. He is showing signs of improvement is slow but nonetheless we are making improvements here. Fortunately I do not see any evidence of active infection locally nor systemically at this time. We are still using the [facility] topical antibiotic over the open area only. 12-29-2021 upon evaluation today patient's wound actually is showing signs of excellent improvement. It has been 2 weeks since I perform any debridement and it definitely shows he has some tissue that needs to be cleaned away but I think we can do so quite easily and readily today. The good news is I do not see any signs of infection I think he is doing much better in that regard. Overall I am extremely pleased with where we stand. 01-12-2022 upon evaluation today patient appears to be doing well currently in regard to his wound although it is not  getting significantly smaller it is also not getting any larger. Fortunately I do not see any evidence of infection locally nor systemically which is  great news and overall I am extremely pleased with where things stand currently. 01-19-2022 upon evaluation today patient appears to be doing well currently in regard to his wound. The PolyMem actually seems to have done extremely well for him. Fortunately I do not see any signs of infection locally nor systemically at this time. 01-26-2022 upon evaluation today patient appears to be making progress. Fortunately there does not appear to be any signs of infection which is great news. No fevers, chills, nausea, vomiting, or diarrhea. With that being said this is very slow to heal and although it is smaller I still feel like we may want to try to do something to speed this up I think that a skin substitute could be beneficial, look into Kerecis. 02-02-2022 upon evaluation today patient's wound is actually showing signs of excellent improvement. I do not see any evidence of infection and overall I think that we are headed in the right direction. Fortunately I think that he is tolerating the dressing changes without complication. 02-09-2022 upon evaluation today patient appears to be doing well currently in regard to his wound. It does look a little bit macerated but fortunately does not appear to be showing any signs of significant skin breakdown over the macerated area which is good news. Fortunately I do not see any evidence of active LEVAUGHN, WOITAS D ([id number]) [id number] Page 3 of 12 infection locally nor systemically at this point which is great news. 02-23-2022 upon evaluation today patient appears to be doing well currently in regard to his wounds. In fact his area on the ankle is actually showing signs of healing quite nicely. We do have the Apligraf ready today I am hopeful this is going to speed things up and get this closed much more effectively and quickly. Fortunately I do not see any signs of active infection locally nor systemically at this time which is great  news. 03-02-2022 upon evaluation today patient appears to be doing excellent in regard to his wound. He is actually been tolerating the dressing changes without complication the wound on the left medial lower extremity is doing quite well and Apligraf seems to have been extremely beneficial for him. I am extremely pleased with where we stand today. 03/16/2022: The wound measurements are smaller today. He has accumulation of a yellow crust around the edges and slough on the surface. There is a musty odor coming from the wound. He has been getting Apligraf. 03-30-2022 upon evaluation today patient appears to be making progress. In regard to his leg ulcer. This is slow but nonetheless the Apligraf has fed things up. Fortunately I do not see any evidence of infection locally nor systemically at this time. No fevers, chills, nausea, vomiting, or diarrhea. Patient is here for Apligraf #4 today. 04-13-2022 upon evaluation today patient appears to be doing a little bit more poorly in regard to his wound. He actually feels like his wrap may have been a little bit tight. Fortunately there does not appear to be any signs of active infection locally nor systemically which is great news and I am pleased in that regard. 04-20-2022 upon evaluation today patient appears to be doing better in regard to his wound from the standpoint of swelling he is actually lost 33 pounds on torsemide in the past week his leg is significantly smaller compared to what it has  been. Fortunately I do not see any signs of active infection locally nor systemically at this time. 04-27-2022 upon evaluation today patient's wound actually is showing signs of erythema and warmth around the edges of the wound I am actually concerned about the possibility of infection. I think we probably need to obtain a wound culture and also can recommend based on what we are seeing that we go ahead and have the patient continue to utilize the compression wrapping  which I think has been of benefit. 05-04-2022 upon evaluation today patient appears to be doing well currently in regard to his wound. He has been tolerating the dressing changes without complication. Fortunately there does not appear to be any signs of active infection locally or systemically which is great news. 05-11-2022 upon evaluation today patient actually appears to be doing excellent. He has been tolerating the dressing changes we actually were trialing a new medication on him last week and this was the ergo clean Ag dressing. With that being said this actually seems to have done extremely well at this point. Fortunately I do not see any signs of active infection locally or systemically at this time. 05-18-2022 upon evaluation today patient appears to be doing okay in regard to his wound I do not see any signs of worsening also notes any signs of significant improvement. Fortunately I do believe that removing in the right direction in general. 05-25-22 upon evaluation today patient appears to be doing well currently in regard to his wound. He has been tolerating the dressing changes without complication. Fortunately I do not see any signs of active infection locally nor systemically which is great news and overall I am extremely pleased with where we stand currently. There does not appear to be any signs of active infection locally nor systemically which is great news. No fevers, chills, nausea, vomiting, or diarrhea. 06-01-2022 upon evaluation today patient actually appears to be making excellent progress in regard to his ankle region. I am very pleased with where we stand I think that he is doing extremely well and very pleased with endoform and drawtex combination. 06-08-2022 upon evaluation today patient appears to be doing well currently in regard to his wound. He has been making good progress and I feel like practicing improvement in the overall size of the wound. I am very pleased in that  regard. Fortunately I do not see any signs of active infection locally nor systemically which is great news. No fevers, chills, nausea, vomiting, or diarrhea. 06-15-2022 upon evaluation today patient appears to be doing well currently in regard to his wound. He has been tolerating the dressing changes without complication. I do believe the endoform has been extremely helpful for him up to this point. I do not see any signs of active infection locally nor systemically which is great news and in general I do believe that we are making pretty good progress here which is excellent news. 06-22-2022 upon evaluation today patient appears to be doing well currently in regard to his wound. He has been tolerating the dressing changes without complication. Fortunately there does not appear to be any signs of active infection at this time which is great news. I think that he is actually taking good progress with the endoform which is excellent as well. 06-29-2022 upon evaluation patient appears to be doing well currently in regard to his wound. He is actually making good progress towards closure and I am very pleased in that regard. I do not see any  evidence of active infection locally nor systemically which is great news and in general I do believe that we are making excellent progress here overall. 07-06-2022 upon evaluation today patient appears to be doing well currently in regard to his wound. He has been tolerating the dressing changes without complication. The good news is he is slowly getting smaller each time I see him. 07-13-2022 upon evaluation today patient appears to be doing well currently in regard to his wound is slowly showing signs of improvement. Fortunately I do not see any signs of infection at this time. No fevers, chills, nausea, vomiting, or diarrhea. 07-20-2022 upon evaluation today patient appears to be doing well currently in regard to his wound. He has been tolerating the dressing changes  without complication. Fortunately I do not see any evidence of active infection locally or systemically which is great news and in general I do believe that we are making headway towards complete closure. This is excellent news. With that being said we are preparing for him to be leaving to go out of town in about a week and a half. For that reason we will going to make a few changes to his dressing today in order to give him something that I think will be easier for him to do when he is out of town. 07-27-2022 upon evaluation today patient appears to be doing well currently in regard to his wound. He has been tolerating the dressing changes without complication. Fortunately there does not appear to be any signs of infection. Fortunately there does not appear to be any signs of infection 08-10-2022 upon evaluation today patient appears to be doing okay in regard to his wound. He actually has been gone for 2 weeks and in the interim unfortunately he did not have result out of the Tegaderm as well as hoping for but nonetheless took good care of his wound. Does not appear to be infected I do think we need to go back to the compression wrap however which I think would definitely benefit him. Fortunately I do not see any signs of active infection at this time which is great news. 08-17-2022 upon evaluation today patient unfortunately appears to be showing signs of infection based on what I see at this time. Fortunately I do not see any evidence of systemic infection though locally I do definitely see some changes that are concerning. I do not think that this is systemic at all. 08-24-2022 upon evaluation today patient appears to be doing well currently in regard to his wound. He has been tolerating the dressing changes this looks significantly improved compared to last week and very pleased. I do not see any signs of active infection locally or systemically at this time. 08-31-2022 upon evaluation today patient  appears to be doing well currently in regard to his wound this is actually showing signs of improvement and actually very pleased with where we stand today. Fortunately I do not see any signs of active infection which is great news. 09-14-2022 upon evaluation today patient appears to be doing well current regard to his wound with section showing signs of improvement would be using JADRIEL, EBRON D ([id number]) [id number] Page 4 of 12 endoform which I think is really doing a great job here. Fortunately I do not see any signs of worsening overall. 09-21-2022 upon evaluation today patient appears to be doing well currently in regard to his wound. He has been tolerating the dressing changes without complication. Fortunately there does not appear to  be any signs of active infection at this time which is good news. 09-28-2022 upon evaluation today patient appears to be doing well currently in regard to his wound which is actually measuring quite a bit smaller. This looks to be doing well and very pleased with where things stand today I do not see any signs of active infection locally or systemically which is great news. No fevers, chills, nausea, vomiting, or diarrhea. 10-05-2022 upon evaluation today patient appears to be doing excellent in regard to his wounds. He has been tolerating the dressing changes without complication and in general I do believe that we will make an really good headway towards complete closure. 10/12/2022 upon evaluation today patient's wound is actually showing signs of excellent improvement. I am actually very pleased with where we stand I think that he is moving in the right direction and this is excellent news. I do not see any evidence of worsening overall 10-19-2022 upon evaluation today patient appears to be doing well currently in regard to his wound. He is showing signs of improvement and very pleased with where we stand I think that is making good  progress here. 10-26-2022 upon evaluation today patient appears to be doing well currently in regard to his wound. In fact this is showing signs of excellent improvement I am actually very pleased with where we stand I do believe that he is making excellent headway towards complete closure which is great news as well. No fevers, chills, nausea, vomiting, or diarrhea. 11-02-2022 upon evaluation today patient appears to be doing great in regard to his wound although he does have an area at the top of the wrap which sometimes gets moist which to be honest he actually appears to have a little bit of a fungal infection at this point. I think that we may need to treat this he can get to it and put some cream in there he actually does that on his own UA with some CeraVe. 11-09-2022 upon evaluation today patient's wound bed actually showed signs of good granulation epithelization at this point. Fortunately I do not see any signs of worsening overall and I do believe that the patient is making good headway towards closure. Patient's wound unfortunately at the top of the wrap appears to be still causing him problems despite the cream that we have been using over the past week. We are going to try an oral antibiotic at this time. 11/20; patient has a longstanding wound on the right medial ankle. He has very healthy looking granulation today and the wound is measuring smaller. We have been using gent and endoform. There is seems little reason to change this Electronic Signature(s) Signed: 11/30/2022 4:37:54 PM By: Baltazar Najjar MD Entered By: Baltazar Najjar on 11/30/2022 12:38:23 -------------------------------------------------------------------------------- Physical Exam Details Patient Name: Date of Service: Cafiero, [name] 11/30/2022 2:45 PM Medical Record Number: [id number] Patient Account Number: [id number] Date of Birth/Sex: Treating RN: [birth year] (67 y.o. M) Primary Care Provider: [name]  Other Clinician: Referring Provider: Treating Provider/Extender: Mikki Santee in Treatment: 69 Constitutional Sitting or standing Blood Pressure is within target range for patient.[name] Kitchen Respirations regular, non-labored and within target range.. Temperature is normal and within the target range for the patient.[name] Kitchen Appears in no distress. Notes Wound exam; wound bed looks very healthy healthy granulation measurements are better his edema control is moderate. He did not want any debridement but truthfully no debridement is required Electronic Signature(s) Signed: 11/30/2022 4:37:54 PM By: Baltazar Najjar  MD Entered By: Baltazar Najjar on 11/30/2022 12:39:07 -------------------------------------------------------------------------------- Physician Orders Details Patient Name: Date of Service: [place], [name] 11/30/2022 2:45 PM Medical Record Number: [id number] Patient Account Number: [id number] Date of Birth/Sex: Treating RN: 28-Jul-1955 (68 y.o. [name] Primary Care Provider: [name] Other Clinician: ABAS, SGRO D ([id number]) [id number] Page 5 of 12 Referring Provider: Treating Provider/Extender: Mikki Santee in Treatment: 69 The following information was scribed by: Shawn Stall The information was scribed for: Baltazar Najjar Verbal / Phone Orders: No Diagnosis Coding ICD-10 Coding Code Description I89.0 Lymphedema, not elsewhere classified I87.333 Chronic venous hypertension (idiopathic) with ulcer and inflammation of bilateral lower extremity L97.828 Non-pressure chronic ulcer of other part of left lower leg with other specified severity L97.818 Non-pressure chronic ulcer of other part of right lower leg with other specified severity E11.621 Type 2 diabetes mellitus with foot ulcer L97.518 Non-pressure chronic ulcer of other part of right foot with other specified severity Follow-up  Appointments Return appointment in 3 weeks. Leonard Schwartz 12/28/2022 (front office to schedule) Nurse Visit: - x1 week 12/07/2022 215pm x2 week 12/14/2022 245pm Anesthetic (In clinic) Topical Lidocaine 4% applied to wound bed Cellular or Tissue Based Products Cellular or Tissue Based Product Type: - Run IVR for Kerrecis- denied #1 Apligraf applied 02/23/2022 #2 Apligraf applied 03/02/2022 #3 Apligraf applied 03/16/2022 #4 Apligraf applied 03/30/2022 HOLD APLIGRAF this week. Cellular or Tissue Based Product applied to wound bed, secured with steri-strips, cover with Adaptic or Mepitel. (DO NOT REMOVE). Bathing/ Shower/ Hygiene May shower with protection but do not get wound dressing(s) wet. Protect dressing(s) with water repellant cover (for example, large plastic bag) or a cast cover and may then take shower. Edema Control - Orders / Instructions Elevate legs to the level of the heart or above for 30 minutes daily and/or when sitting for 3-4 times a day throughout the day. Avoid standing for long periods of time. Patient to wear own compression stockings every day. - left leg Exercise regularly Moisturize legs daily. Compression stocking or Garment 30-40 mm/Hg pressure to: Non Wound Condition Right Lower Extremity pply the following to affected area as directed: - ketoconazole A Wound Treatment Wound #7 - Malleolus Wound Laterality: Right, Medial Cleanser: Soap and Water 1 x Per Week/30 Days Discharge Instructions: May shower and wash wound with dial antibacterial soap and water prior to dressing change. Cleanser: Vashe 5.8 (oz) 1 x Per Week/30 Days Discharge Instructions: Cleanse the wound with Vashe prior to applying a clean dressing using gauze sponges, not tissue or cotton balls. Peri-Wound Care: Sween Lotion (Moisturizing lotion) 1 x Per Week/30 Days Discharge Instructions: Apply moisturizing lotion as directed Topical: Gentamicin 1 x Per Week/30 Days Discharge Instructions: As directed  by physician Prim Dressing: Endoform 2x2 in (Dispense As Written) 1 x Per Week/30 Days ary Discharge Instructions: Moisten with saline Secondary Dressing: Zetuvit Plus 4x8 in 1 x Per Week/30 Days Discharge Instructions: Apply over primary dressing as directed. Compression Wrap: Urgo K2, (equivalent to a 4 layer) two layer compression system, regular 1 x Per Week/30 Days Discharge Instructions: Apply Urgo K2 as directed (alternative to 4 layer compression). Electronic Signature(s) Signed: 11/30/2022 4:36:51 PM By: Shawn Stall RN, BSN [place], [name] ([id number]) [id number] Page 6 of 12 Signed: 11/30/2022 4:37:54 PM By: Baltazar Najjar MD Entered By: Shawn Stall on 11/30/2022 12:23:59 -------------------------------------------------------------------------------- Problem List Details Patient Name: Date of Service: [name], [name] 11/30/2022 2:45 PM Medical Record Number: [id number] Patient Account Number: [id number] Date  of Birth/Sex: Treating RN: [birth year] (67 y.o. Harlon [name], Millard.[name] Primary Care Provider: [name] Other Clinician: Referring Provider: Treating Provider/Extender: Mikki Santee in Treatment: 69 Active Problems ICD-10 Encounter Code Description Active Date MDM Diagnosis I89.0 Lymphedema, not elsewhere classified 08/04/2021 No Yes I87.333 Chronic venous hypertension (idiopathic) with ulcer and inflammation of 08/04/2021 No Yes bilateral lower extremity L97.828 Non-pressure chronic ulcer of other part of left lower leg with other specified 08/04/2021 No Yes severity L97.818 Non-pressure chronic ulcer of other part of right lower leg with other specified 08/04/2021 No Yes severity E11.621 Type 2 diabetes mellitus with foot ulcer 08/04/2021 No Yes L97.518 Non-pressure chronic ulcer of other part of right foot with other specified 08/04/2021 No Yes severity Inactive Problems Resolved Problems Electronic  Signature(s) Signed: 11/30/2022 4:37:54 PM By: Baltazar Najjar MD Entered By: Baltazar Najjar on 11/30/2022 12:37:12 -------------------------------------------------------------------------------- Progress Note Details Patient Name: Date of Service: Belinsky, [name] 11/30/2022 2:45 PM Medical Record Number: [id number] Patient Account Number: [id number] Date of Birth/Sex: Treating RN: 23-Jul-1955 (67 y.o. [name], FEBRES D ([id number]) [id number] Page 7 of 12 Primary Care Provider: [name] Other Clinician: Referring Provider: Treating Provider/Extender: Mikki Santee in Treatment: 69 Subjective History of Present Illness (HPI) ADMISSION 03/22/2021 This is a 67 year old man with a past medical history significant for diabetes type 2, congestive heart failure, peripheral arterial disease, morbid obesity, venous insufficiency, and coronary artery disease. He has been followed by Dr. Loreta Ave in podiatry, who performed a transmetatarsal amputation on the left foot in August 2022. He had issues healing that wound, but based upon Dr. Kenna Gilbert notes, ultimately the TMA wound healed. During his recovery from that surgery, however, ulcers opened up over the DIP joint of the right second and third toe. These have apparently closed and reopened multiple times. It sounds like one of the issues has been moisture accumulation and maceration of the tissues causing them to reopen. At his last visit with Dr. Loreta Ave, on March 01, 2021, there continues to be problems with moisture and he was referred to wound care for further evaluation and management. He had a formal aortogram with runoff performed prior to his TMA. The findings are copied here: Patient has inline flow to both feet with no significant flow-limiting lesion that would be amenable to percutaneous or open revascularization. He does have an element of small vessel disease and has a short  segment occlusion of the distal anterior tibial/dorsalis pedis artery on the left foot but does have posterior tibial artery flow. Would recommend management of wounds with amputation of toes 2 and 3 on the right foot if the wounds do not heal and deteriorate. Transmetatarsal amputation on the left side has as good a blood supply as it is going to get and hopefully this will heal in the future. Formal ABIs were done in January 2023. They are normal bilaterally. ABI Findings: +---------+------------------+-----+----------+--------+ Right Rt Pressure (mmHg)IndexWaveform Comment  +---------+------------------+-----+----------+--------+ Brachial 160    +---------+------------------+-----+----------+--------+ PTA 192 1.06 monophasic  +---------+------------------+-----+----------+--------+ DP 159 0.88 monophasic  +---------+------------------+-----+----------+--------+ Great T oe145 0.80    +---------+------------------+-----+----------+--------+ +--------+------------------+-----+---------+-------+ Left Lt Pressure (mmHg)IndexWaveform Comment +--------+------------------+-----+---------+-------+ [id number]    +--------+------------------+-----+---------+-------+ PTA 204 1.13 triphasic  +--------+------------------+-----+---------+-------+ DP 194 1.07 biphasic   +--------+------------------+-----+---------+-------+ +-------+-----------+-----------+------------+------------+ ABI/TBIT oday's ABIT oday's TBIPrevious ABIPrevious TBI +-------+-----------+-----------+------------+------------+ Right 1.06 0.8 1.26 0.65  +-------+-----------+-----------+------------+------------+ Left 1.13 amputation 1.15 amputation  +-------+-----------+-----------+------------+------------+ Previous ABI on 08/06/20 at [facility] Pedal pressures falsely elevated due to medial calcification. Summary: Right: Resting right  ankle-brachial  index is within normal range. The right toe-brachial index is normal. Left: Resting left ankle-brachial index is within normal range. READMISSION 08/04/2021 Mr. Carmona is now a 67 year old man who I remember from this clinic many years ago I think he had a right lower extremity predominantly venous wound at the time. He was here for 1 visit in March of this year had wounds on his right second and third toes we apparently dressed them many and they healed so he did not come back. He is listed in [place] is being a diabetic although the patient denies this says he is verified it with his primary doctor. In any case over the last several weeks or so according the patient although these wounds look somewhat more chronic than that he has developed predominantly large wounds on the right medial and right lateral ankle smaller areas on the left leg and areas on the dorsal aspect of his right second and third toes. Its not clear how he has been dressing these. 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 The patient has known PAD. He had an angiogram in August 2022 at which time he had nonhealing wounds in both feet. On the left his major vessels in the thigh were all patent. He had three-vessel patent to the level of the ankle. He had a very short occlusion in the left anterior tibial. On the right lower extremity the major vessels in his thigh were all patent. He had three-vessel runoff to the foot sluggish filling of the anterior tibial artery. He was felt to have some component of small vessel disease but nothing that was amenable or needed revascularization. It was recommended that he have amputation of the second and third toes on the right foot if they did not heal He has been following with Dr. Loreta Ave of podiatry. Dr. Loreta Ave got him juxta lite stockings although I do not think he had them on properly he has  uncontrolled edema in both legs Past medical history includes type 2 diabetes [although the patient really denies this], left TMA in 2022,lower extremity wounds in fact attendance at this clinic in 2009-2010, A-fib on Eliquis, chronic venous insufficiency with secondary lymphedema history of non-Hodgkin's lymphoma. MOUAD, DRYDEN ([id number]) [id number] Page 8 of 12 08-11-2021 upon evaluation today patient presents for follow-up evaluation he was seen last Wednesday initially for inspection here in our clinic. With that being said he tells me that he unfortunately has been having a lot of drainage and is actually coming through his wrap. Fortunately I do not see any evidence of active infection locally or systemically at this time which is great news. No fevers, chills, nausea, vomiting, or diarrhea. With that being said there does appear to be some evidence of local infection based on what I am seeing today. 08-18-2021 upon evaluation today patient appears to be doing okay currently in regard to his wounds with that being said that he is doing much better but still has a long ways to go to get to where he wanted to be. I think the infection is significantly improved. He has another week of the antibiotic at this point. 08-25-2021 upon evaluation today patient's wounds are actually doing decently well he has erythema has significantly improved. I think the cellulitis is under controlled I am going to continue him on 2 more weeks of the Levaquin at 500 mg this is a lower  dose but I am hoping it will be better for him. 09-08-2021 upon evaluation today patient appears to be doing excellent in regard to his wounds. Since I last saw him he was actually in the hospital where he ended up having a pacemaker put in. Subsequently he tells me that he is actually doing quite well although they were unsure whether they were going to do it due to the fact that he had the wounds on his legs. And  then I am glad they did anything seem to be doing well. 09-15-2021 upon evaluation patient's wounds are actually showing signs of improvement. The right side wounds do appear to have some need for sharp debridement today and I Ernie Hew go ahead and proceed with that. I think that if we get the wounds cleaned up he will actually show signs of continued improvement. I am also leaning towards switching to [facility] which I think will be a much better option for him. 09-22-2021 patient's wounds are showing signs of excellent improvement. I am actually extremely pleased with where we stand and I think that the patient is making great progress. There does not appear to be any signs of active infection. 09-29-2021 upon evaluation today patient appears to be doing excellent in regard to his wounds. He is actually tolerating the dressing changes without complication. Fortunately I see no evidence of active infection locally or systemically at this time which is great news and overall I am extremely pleased with where we stand currently. [id number] upon evaluation today patient actually appears to be doing excellent in regard to his wounds. The left leg is almost completely healed the right ankle is significantly smaller. Overall I am extremely pleased with where we stand at this point. I do not see any evidence of active infection at this time. 10-13-2021 upon evaluation today patient appears to be doing excellent in regard to his wounds. I really feel like he is making good progress here and I am very pleased in that regard. Fortunately I do not see any signs of active infection at this time. We are using the [facility] topical antibiotic therapy. 10-20-2021 upon evaluation today patient appears to be doing well with regard to his wound on the right medial ankle region the left leg is almost completely healed. I am actually very pleased with where we stand today. 11-03-2021 upon evaluation today patient's wound  actually is going require some sharp debridement but appears to be doing much better which is great news. Fortunately I do not see any signs of active infection at this time. 11-10-2021 upon evaluation today patient's wound is actually showing signs of excellent improvement. Fortunately I do not see any evidence of infection locally or systemically which is great news and overall I am extremely pleased with where we stand today. I do believe he is making good progress he does have his [facility] topical antibiotics with him here today. 11-24-2021 upon evaluation today patient actually showing signs of excellent improvement this appears to be doing much better. Fortunately I do not see any evidence of infection locally or systemically at this time. 12-01-2021 upon evaluation today patient appears to be doing well currently in regard to his wound. He has been tolerating the dressing changes without complication. Fortunately I do not see any evidence of active infection at this time which is great news and overall I am extremely pleased with where we stand today. 12-15-2021 upon evaluation today patient appears to be doing well with regard to his  wound. He is showing signs of improvement is slow but nonetheless we are making improvements here. Fortunately I do not see any evidence of active infection locally nor systemically at this time. We are still using the [name] topical antibiotic over the open area only. 12-29-2021 upon evaluation today patient's wound actually is showing signs of excellent improvement. It has been 2 weeks since I perform any debridement and it definitely shows he has some tissue that needs to be cleaned away but I think we can do so quite easily and readily today. The good news is I do not see any signs of infection I think he is doing much better in that regard. Overall I am extremely pleased with where we stand. 01-12-2022 upon evaluation today patient appears to be doing well  currently in regard to his wound although it is not getting significantly smaller it is also not getting any larger. Fortunately I do not see any evidence of infection locally nor systemically which is great news and overall I am extremely pleased with where things stand currently. 01-19-2022 upon evaluation today patient appears to be doing well currently in regard to his wound. The PolyMem actually seems to have done extremely well for him. Fortunately I do not see any signs of infection locally nor systemically at this time. 01-26-2022 upon evaluation today patient appears to be making progress. Fortunately there does not appear to be any signs of infection which is great news. No fevers, chills, nausea, vomiting, or diarrhea. With that being said this is very slow to heal and although it is smaller I still feel like we may want to try to do something to speed this up I think that a skin substitute could be beneficial, look into Kerecis. 02-02-2022 upon evaluation today patient's wound is actually showing signs of excellent improvement. I do not see any evidence of infection and overall I think that we are headed in the right direction. Fortunately I think that he is tolerating the dressing changes without complication. 02-09-2022 upon evaluation today patient appears to be doing well currently in regard to his wound. It does look a little bit macerated but fortunately does not appear to be showing any signs of significant skin breakdown over the macerated area which is good news. Fortunately I do not see any evidence of active infection locally nor systemically at this point which is great news. 02-23-2022 upon evaluation today patient appears to be doing well currently in regard to his wounds. In fact his area on the ankle is actually showing signs of healing quite nicely. We do have the Apligraf ready today I am hopeful this is going to speed things up and get this closed much more effectively and  quickly. Fortunately I do not see any signs of active infection locally nor systemically at this time which is great news. 03-02-2022 upon evaluation today patient appears to be doing excellent in regard to his wound. He is actually been tolerating the dressing changes without complication the wound on the left medial lower extremity is doing quite well and Apligraf seems to have been extremely beneficial for him. I am extremely pleased with where we stand today. 03/16/2022: The wound measurements are smaller today. He has accumulation of a yellow crust around the edges and slough on the surface. There is a musty odor coming from the wound. He has been getting Apligraf. 03-30-2022 upon evaluation today patient appears to be making progress. In regard to his leg ulcer. This is slow but  nonetheless the Apligraf has fed things up. Fortunately I do not see any evidence of infection locally nor systemically at this time. No fevers, chills, nausea, vomiting, or diarrhea. Patient is here for Apligraf #4 today. 04-13-2022 upon evaluation today patient appears to be doing a little bit more poorly in regard to his wound. He actually feels like his wrap may have been a little DAMONIE, YELINEK D ([id number]) [id number] Page 9 of 12 bit tight. Fortunately there does not appear to be any signs of active infection locally nor systemically which is great news and I am pleased in that regard. 04-20-2022 upon evaluation today patient appears to be doing better in regard to his wound from the standpoint of swelling he is actually lost 33 pounds on torsemide in the past week his leg is significantly smaller compared to what it has been. Fortunately I do not see any signs of active infection locally nor systemically at this time. 04-27-2022 upon evaluation today patient's wound actually is showing signs of erythema and warmth around the edges of the wound I am actually concerned about the possibility of  infection. I think we probably need to obtain a wound culture and also can recommend based on what we are seeing that we go ahead and have the patient continue to utilize the compression wrapping which I think has been of benefit. 05-04-2022 upon evaluation today patient appears to be doing well currently in regard to his wound. He has been tolerating the dressing changes without complication. Fortunately there does not appear to be any signs of active infection locally or systemically which is great news. 05-11-2022 upon evaluation today patient actually appears to be doing excellent. He has been tolerating the dressing changes we actually were trialing a new medication on him last week and this was the ergo clean Ag dressing. With that being said this actually seems to have done extremely well at this point. Fortunately I do not see any signs of active infection locally or systemically at this time. 05-18-2022 upon evaluation today patient appears to be doing okay in regard to his wound I do not see any signs of worsening also notes any signs of significant improvement. Fortunately I do believe that removing in the right direction in general. 05-25-22 upon evaluation today patient appears to be doing well currently in regard to his wound. He has been tolerating the dressing changes without complication. Fortunately I do not see any signs of active infection locally nor systemically which is great news and overall I am extremely pleased with where we stand currently. There does not appear to be any signs of active infection locally nor systemically which is great news. No fevers, chills, nausea, vomiting, or diarrhea. 06-01-2022 upon evaluation today patient actually appears to be making excellent progress in regard to his ankle region. I am very pleased with where we stand I think that he is doing extremely well and very pleased with endoform and drawtex combination. 06-08-2022 upon evaluation today patient  appears to be doing well currently in regard to his wound. He has been making good progress and I feel like practicing improvement in the overall size of the wound. I am very pleased in that regard. Fortunately I do not see any signs of active infection locally nor systemically which is great news. No fevers, chills, nausea, vomiting, or diarrhea. 06-15-2022 upon evaluation today patient appears to be doing well currently in regard to his wound. He has been tolerating the dressing changes without complication. I  do believe the endoform has been extremely helpful for him up to this point. I do not see any signs of active infection locally nor systemically which is great news and in general I do believe that we are making pretty good progress here which is excellent news. 06-22-2022 upon evaluation today patient appears to be doing well currently in regard to his wound. He has been tolerating the dressing changes without complication. Fortunately there does not appear to be any signs of active infection at this time which is great news. I think that he is actually taking good progress with the endoform which is excellent as well. 06-29-2022 upon evaluation patient appears to be doing well currently in regard to his wound. He is actually making good progress towards closure and I am very pleased in that regard. I do not see any evidence of active infection locally nor systemically which is great news and in general I do believe that we are making excellent progress here overall. 07-06-2022 upon evaluation today patient appears to be doing well currently in regard to his wound. He has been tolerating the dressing changes without complication. The good news is he is slowly getting smaller each time I see him. 07-13-2022 upon evaluation today patient appears to be doing well currently in regard to his wound is slowly showing signs of improvement. Fortunately I do not see any signs of infection at this time. No  fevers, chills, nausea, vomiting, or diarrhea. 07-20-2022 upon evaluation today patient appears to be doing well currently in regard to his wound. He has been tolerating the dressing changes without complication. Fortunately I do not see any evidence of active infection locally or systemically which is great news and in general I do believe that we are making headway towards complete closure. This is excellent news. With that being said we are preparing for him to be leaving to go out of town in about a week and a half. For that reason we will going to make a few changes to his dressing today in order to give him something that I think will be easier for him to do when he is out of town. 07-27-2022 upon evaluation today patient appears to be doing well currently in regard to his wound. He has been tolerating the dressing changes without complication. Fortunately there does not appear to be any signs of infection. Fortunately there does not appear to be any signs of infection 08-10-2022 upon evaluation today patient appears to be doing okay in regard to his wound. He actually has been gone for 2 weeks and in the interim unfortunately he did not have result out of the Tegaderm as well as hoping for but nonetheless took good care of his wound. Does not appear to be infected I do think we need to go back to the compression wrap however which I think would definitely benefit him. Fortunately I do not see any signs of active infection at this time which is great news. 08-17-2022 upon evaluation today patient unfortunately appears to be showing signs of infection based on what I see at this time. Fortunately I do not see any evidence of systemic infection though locally I do definitely see some changes that are concerning. I do not think that this is systemic at all. 08-24-2022 upon evaluation today patient appears to be doing well currently in regard to his wound. He has been tolerating the dressing changes this  looks significantly improved compared to last week and very pleased. I  do not see any signs of active infection locally or systemically at this time. 08-31-2022 upon evaluation today patient appears to be doing well currently in regard to his wound this is actually showing signs of improvement and actually very pleased with where we stand today. Fortunately I do not see any signs of active infection which is great news. 09-14-2022 upon evaluation today patient appears to be doing well current regard to his wound with section showing signs of improvement would be using endoform which I think is really doing a great job here. Fortunately I do not see any signs of worsening overall. 09-21-2022 upon evaluation today patient appears to be doing well currently in regard to his wound. He has been tolerating the dressing changes without complication. Fortunately there does not appear to be any signs of active infection at this time which is good news. 09-28-2022 upon evaluation today patient appears to be doing well currently in regard to his wound which is actually measuring quite a bit smaller. This looks to be doing well and very pleased with where things stand today I do not see any signs of active infection locally or systemically which is great news. No fevers, chills, nausea, vomiting, or diarrhea. 10-05-2022 upon evaluation today patient appears to be doing excellent in regard to his wounds. He has been tolerating the dressing changes without complication and in general I do believe that we will make an really good headway towards complete closure. 10/12/2022 upon evaluation today patient's wound is actually showing signs of excellent improvement. I am actually very pleased with where we stand I think that he is moving in the right direction and this is excellent news. I do not see any evidence of worsening overall 10-19-2022 upon evaluation today patient appears to be doing well currently in regard to his  wound. He is showing signs of improvement and very pleased with where we stand I think that is making good progress here. ALDOUS, LANDSMAN ([id number]) [id number] Page 10 of 12 10-26-2022 upon evaluation today patient appears to be doing well currently in regard to his wound. In fact this is showing signs of excellent improvement I am actually very pleased with where we stand I do believe that he is making excellent headway towards complete closure which is great news as well. No fevers, chills, nausea, vomiting, or diarrhea. 11-02-2022 upon evaluation today patient appears to be doing great in regard to his wound although he does have an area at the top of the wrap which sometimes gets moist which to be honest he actually appears to have a little bit of a fungal infection at this point. I think that we may need to treat this he can get to it and put some cream in there he actually does that on his own UA with some CeraVe. 11-09-2022 upon evaluation today patient's wound bed actually showed signs of good granulation epithelization at this point. Fortunately I do not see any signs of worsening overall and I do believe that the patient is making good headway towards closure. Patient's wound unfortunately at the top of the wrap appears to be still causing him problems despite the cream that we have been using over the past week. We are going to try an oral antibiotic at this time. 11/20; patient has a longstanding wound on the right medial ankle. He has very healthy looking granulation today and the wound is measuring smaller. We have been using gent and endoform. There is seems little reason to  change this Objective Constitutional Sitting or standing Blood Pressure is within target range for patient.[name] Kitchen Respirations regular, non-labored and within target range.. Temperature is normal and within the target range for the patient.[name] Kitchen Appears in no distress. Vitals Time Taken: 2:56  PM, Height: 71 in, Weight: 350 lbs, BMI: 48.8, Temperature: 98.4 F, Pulse: 71 bpm, Respiratory Rate: 18 breaths/min, Blood Pressure: 142/88 mmHg. General Notes: Wound exam; wound bed looks very healthy healthy granulation measurements are better his edema control is moderate. He did not want any debridement but truthfully no debridement is required Integumentary (Hair, Skin) Wound #7 status is Open. Original cause of wound was Blister. The date acquired was: 07/21/2021. The wound has been in treatment 69 weeks. The wound is located on the Right,Medial Malleolus. The wound measures 2.7cm length x 1.2cm width x 0.1cm depth; 2.545cm^2 area and 0.254cm^3 volume. There is Fat Layer (Subcutaneous Tissue) exposed. There is no tunneling or undermining noted. There is a medium amount of serosanguineous drainage noted. There is large (67-100%) red granulation within the wound bed. There is a small (1-33%) amount of necrotic tissue within the wound bed including Adherent Slough. The periwound skin appearance did not exhibit: Callus, Crepitus, Excoriation, Induration, Rash, Scarring, Dry/Scaly, Maceration, Atrophie Blanche, Cyanosis, Ecchymosis, Hemosiderin Staining, Mottled, Pallor, Rubor, Erythema. Periwound temperature was noted as No Abnormality. 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 There was a Double Layer Compression Therapy Procedure by Shawn Stall,  RN. Post procedure Diagnosis Wound #7: Same as Pre-Procedure Plan Follow-up Appointments: Return appointment in 3 weeks. Leonard Schwartz 12/28/2022 (front office to schedule) Nurse Visit: - x1 week 12/07/2022 215pm x2 week 12/14/2022 245pm Anesthetic: (In clinic) Topical Lidocaine 4% applied to wound bed Cellular or Tissue Based Products: Cellular or Tissue Based Product Type: - Run IVR for Kerrecis- denied #1 Apligraf applied 02/23/2022 #2 Apligraf applied 03/02/2022 #3 Apligraf applied 03/16/2022 #4 Apligraf applied 03/30/2022 HOLD APLIGRAF this week. EDDY, [name] ([id number]) [id number] Page 11 of 12 Cellular or Tissue Based Product applied to wound bed, secured with steri-strips, cover with Adaptic or Mepitel. (DO NOT REMOVE). Bathing/ Shower/ Hygiene: May shower with protection but do not get wound dressing(s) wet. Protect dressing(s) with water repellant cover (for example, large plastic bag) or a cast cover and may then take shower. Edema Control - Orders / Instructions: Elevate legs to the level of the heart or above for 30 minutes daily and/or when sitting for 3-4 times a day throughout the day. Avoid standing for long periods of time. Patient to wear own compression stockings every day. - left leg Exercise regularly Moisturize legs daily. Compression stocking or Garment 30-40 mm/Hg pressure to: Non Wound Condition: Apply the following to affected area as directed: - ketoconazole WOUND #7: - Malleolus Wound Laterality: Right, Medial Cleanser: Soap and Water 1 x Per Week/30 Days Discharge Instructions: May shower and wash wound with dial antibacterial soap and water prior to dressing change. Cleanser: Vashe 5.8 (oz) 1 x Per Week/30 Days Discharge Instructions: Cleanse the wound with Vashe prior to applying  a clean dressing using gauze sponges, not tissue or cotton balls. Peri-Wound Care: Sween Lotion (Moisturizing lotion) 1 x Per Week/30 Days Discharge  Instructions: Apply moisturizing lotion as directed Topical: Gentamicin 1 x Per Week/30 Days Discharge Instructions: As directed by physician Prim Dressing: Endoform 2x2 in (Dispense As Written) 1 x Per Week/30 Days ary Discharge Instructions: Moisten with saline Secondary Dressing: Zetuvit Plus 4x8 in 1 x Per Week/30 Days Discharge Instructions: Apply over primary dressing as directed. Com pression Wrap: Urgo K2, (equivalent to a 4 layer) two layer compression system, regular 1 x Per Week/30 Days Discharge Instructions: Apply Urgo K2 as directed (alternative to 4 layer compression). 1. Continue with the endoform and gentamicin under Urgo K2 2. Making good progress Electronic Signature(s) Signed: 11/30/2022 4:37:54 PM By: Baltazar Najjar MD Entered By: Baltazar Najjar on 11/30/2022 12:39:39 -------------------------------------------------------------------------------- SuperBill Details Patient Name: Date of Service: Olene [name] D. 11/30/2022 Medical Record Number: [id number] Patient Account Number: [id number] Date of Birth/Sex: Treating RN: [birth year] (67 y.o. Harlon [name], Millard.[name] Primary Care Provider: [name] Other Clinician: Referring Provider: Treating Provider/Extender: Mikki Santee in Treatment: 69 Diagnosis Coding ICD-10 Codes Code Description I89.0 Lymphedema, not elsewhere classified I87.333 Chronic venous hypertension (idiopathic) with ulcer and inflammation of bilateral lower extremity L97.828 Non-pressure chronic ulcer of other part of left lower leg with other specified severity L97.818 Non-pressure chronic ulcer of other part of right lower leg with other specified severity E11.621 Type 2 diabetes mellitus with foot ulcer L97.518 Non-pressure chronic ulcer of other part of right foot with other specified severity Facility Procedures : 3 CPT4 Code: 7829562 Description: (Facility Use Only) 13086VH - APPLY MULTLAY COMPRS LWR RT  LEG Modifier: Quantity: 1 Physician Procedures : CPT4 Code Description Modifier [id number] - WC PHYS LEVEL 3 - EST PT ICD-10 Diagnosis Description I87.333 Chronic venous hypertension (idiopathic) with ulcer and inflammation of bilateral lower extremity [place], [name] D ([id number])  [id number] L97.828 Non-pressure chronic ulcer of other part of left lower leg with other specified severity L97.818 Non-pressure chronic ulcer of other part of right lower leg with other specified severity Quantity: 1 f Page 12 of 12 Electronic Signature(s) Signed: 11/30/2022 4:37:54 PM By: Baltazar Najjar MD Entered By: Baltazar Najjar on 11/30/2022 12:40:07

## 2022-12-02 DIAGNOSIS — G4733 Obstructive sleep apnea (adult) (pediatric): Secondary | ICD-10-CM | POA: Diagnosis not present

## 2022-12-02 NOTE — Progress Notes (Signed)
ARCHER, JEANFREAU ([id number]) [id number] Page 1 of 8 Visit Report for 11/30/2022 Arrival Information Details Patient Name: Date of Service: [name], [name] 11/30/2022 2:45 PM Medical Record Number: [id number] Patient Account Number: [id number] Date of Birth/Sex: Treating RN: 23-Jan-1955 (67 y.o. M) Primary Care Alanny Rivers: [name] Other Clinician: Referring Jalaiyah Throgmorton: Treating Dynisha Due/Extender: Mikki Santee in Treatment: 69 Visit Information History Since Last Visit Added or deleted any medications: No Patient Arrived: [name] Any new allergies or adverse reactions: No Arrival Time: 14:47 Had a fall or experienced change in No Accompanied By: self activities of daily living that may affect Transfer Assistance: None risk of falls: Patient Identification Verified: Yes Signs or symptoms of abuse/neglect since last visito No Secondary Verification Process Completed: Yes Hospitalized since last visit: No Patient Requires Transmission-Based Precautions: No Implantable device outside of the clinic excluding No Patient Has Alerts: Yes cellular tissue based products placed in the center Patient Alerts: Patient on Blood Thinner since last visit: 01/2021 ABI L 1.13 R 1.06 Has Dressing in Place as Prescribed: Yes 01/2021 TBI L amp R 0.8 Has Compression in Place as Prescribed: Yes Pain Present Now: No Electronic Signature(s) Signed: 12/02/2022 11:01:27 AM By: Thayer Dallas Entered By: Thayer Dallas on 11/30/2022 11:48:06 -------------------------------------------------------------------------------- Compression Therapy Details Patient Name: Date of Service: [name]. 11/30/2022 2:45 PM Medical Record Number: [id number] Patient Account Number: [id number] Date of Birth/Sex: Treating RN: 13-Dec-1955 (67 y.o. [name] Primary Care Tesla Bochicchio: [name] Other Clinician: Referring Zanetta Dehaan: Treating Eliyanah Elgersma/Extender: Mikki Santee in Treatment: 69 Compression Therapy Performed for Wound Assessment: Wound #7 Right,Medial Malleolus Performed By: Clinician Shawn Stall, RN Compression Type: Double Layer Post Procedure Diagnosis Same as Pre-procedure Electronic Signature(s) Signed: 11/30/2022 4:36:51 PM By: Shawn Stall RN, BSN Entered By: Shawn Stall on 11/30/2022 12:20:59 Gearing, [name] ([id number]) [id number] Page 2 of 8 -------------------------------------------------------------------------------- Encounter Discharge Information Details Patient Name: Date of Service: [name] 11/30/2022 2:45 PM Medical Record Number: [id number] Patient Account Number: [id number] Date of Birth/Sex: Treating RN: 04/05/1955 (67 y.o. [name] Primary Care Chelcie Estorga: [name] Other Clinician: Referring Brailynn Breth: Treating Merikay Lesniewski/Extender: Mikki Santee in Treatment: 69 Encounter Discharge Information Items Discharge Condition: Stable Ambulatory Status: Walker Discharge Destination: Home Transportation: Private Auto Accompanied By: self Schedule Follow-up Appointment: Yes Clinical Summary of Care: Electronic Signature(s) Signed: 11/30/2022 4:36:51 PM By: Shawn Stall RN, BSN Entered By: Shawn Stall on 11/30/2022 12:25:17 -------------------------------------------------------------------------------- Lower Extremity Assessment Details Patient Name: Date of Service: [name] [name]. 11/30/2022 2:45 PM Medical Record Number: [id number] Patient Account Number: [id number] Date of Birth/Sex: Treating RN: 06/24/1955 (67 y.o. M) Primary Care Jearlean Demauro: [name] Other Clinician: Referring Asher Torpey: Treating Mishelle Hassan/Extender: Mikki Santee in Treatment: 69 Edema Assessment Assessed: Kyra Searles: No] Franne Forts: No] Edema: [Left: N] [Right: o] Calf Left: Right: Point of Measurement: 31 cm From Medial Instep 40  cm Ankle Left: Right: Point of Measurement: 11 cm From Medial Instep 25.5 cm Vascular Assessment Extremity colors, hair growth, and conditions: Extremity Color: [Right:Normal] Hair Growth on Extremity: [Right:No] Temperature of Extremity: [Right:Warm] Capillary Refill: [Right:< 3 seconds] Dependent Rubor: [Right:No Yes] Electronic Signature(s) Signed: 12/02/2022 11:01:27 AM By: Thayer Dallas Entered By: Thayer Dallas on 11/30/2022 12:01:37 [name], [name] ([id number]) [id number] Page 3 of 8 -------------------------------------------------------------------------------- Multi Wound Chart Details Patient Name: Date of Service: RANE, [name] 11/30/2022 2:45 PM Medical Record Number: [id number] Patient Account Number: [id number] Date of Birth/Sex: Treating RN:  Aug 17, 1955 (67 y.o. M) Primary Care Akiah Bauch: [name] Other Clinician: Referring Aeon Kessner: Treating Zarina Pe/Extender: Mikki Santee in Treatment: 69 Vital Signs Height(in): 71 Pulse(bpm): 71 Weight(lbs): 350 Blood Pressure(mmHg): 142/88 Body Mass Index(BMI): 48.8 Temperature(F): 98.4 Respiratory Rate(breaths/min): 18 [7:Photos:] [N/A:N/A] Right, Medial Malleolus N/A N/A Wound Location: Blister N/A N/A Wounding Event: Venous Leg Ulcer N/A N/A Primary Etiology: Lymphedema N/A N/A Secondary Etiology: Sleep Apnea, Arrhythmia, Congestive N/A N/A Comorbid History: Heart Failure, Hypertension, Hypotension, Peripheral Arterial Disease, Peripheral Venous Disease, Type II Diabetes, Gout, Osteoarthritis 07/21/2021 N/A N/A Date Acquired: 44 N/A N/A Weeks of Treatment: Open N/A N/A Wound Status: No N/A N/A Wound Recurrence: 2.7x1.2x0.1 N/A N/A Measurements L x W x [name] 2.545 N/A N/A A (cm) : rea 0.254 N/A N/A Volume (cm) : 93.40% N/A N/A % Reduction in Area: 99.10% N/A N/A % Reduction in Volume: Full Thickness With Exposed Support N/A  N/A Classification: Structures Medium N/A N/A Exudate Amount: Serosanguineous N/A N/A Exudate Type: red, brown N/A N/A Exudate Color: Large (67-100%) N/A N/A Granulation Amount: Red N/A N/A Granulation Quality: Small (1-33%) N/A N/A Necrotic Amount: Fat Layer (Subcutaneous Tissue): Yes N/A N/A Exposed Structures: Fascia: No Tendon: No Muscle: No Joint: No Bone: No Small (1-33%) N/A N/A Epithelialization: Excoriation: No N/A N/A Periwound Skin Texture: Induration: No Callus: No Crepitus: No Rash: No Scarring: No Maceration: No N/A N/A Periwound Skin Moisture: Dry/Scaly: No Atrophie Blanche: No N/A N/A Periwound Skin Color: Cyanosis: No Ecchymosis: No Erythema: No Hemosiderin Staining: No Mottled: No Pallor: No [name], [name] ([id number]) [id number] Page 4 of 8 Rubor: No No Abnormality N/A N/A Temperature: Compression Therapy N/A N/A Procedures Performed: Treatment Notes Wound #7 (Malleolus) Wound Laterality: Right, Medial Cleanser Soap and Water Discharge Instruction: May shower and wash wound with dial antibacterial soap and water prior to dressing change. Vashe 5.8 (oz) Discharge Instruction: Cleanse the wound with Vashe prior to applying a clean dressing using gauze sponges, not tissue or cotton balls. Peri-Wound Care Sween Lotion (Moisturizing lotion) Discharge Instruction: Apply moisturizing lotion as directed Topical Gentamicin Discharge Instruction: As directed by physician Primary Dressing Endoform 2x2 in Discharge Instruction: Moisten with saline Secondary Dressing Zetuvit Plus 4x8 in Discharge Instruction: Apply over primary dressing as directed. Secured With Compression Wrap Urgo K2, (equivalent to a 4 layer) two layer compression system, regular Discharge Instruction: Apply Urgo K2 as directed (alternative to 4 layer compression). Compression Stockings Add-Ons Electronic Signature(s) Signed: 11/30/2022  4:37:54 PM By: Baltazar Najjar MD Entered By: Baltazar Najjar on 11/30/2022 12:37:23 -------------------------------------------------------------------------------- Multi-Disciplinary Care Plan Details Patient Name: Date of Service: [name], [name]. 11/30/2022 2:45 PM Medical Record Number: [id number] Patient Account Number: [id number] Date of Birth/Sex: Treating RN: 12-15-1955 (67 y.o. [name] Primary Care Shakita Keir: [name] Other Clinician: Referring Anthony Tamburo: Treating Fayette Gasner/Extender: Mikki Santee in Treatment: 69 Multidisciplinary Care Plan reviewed with physician Active Inactive Pain, Acute or Chronic Nursing Diagnoses: Pain, acute or chronic: actual or potential Potential alteration in comfort, pain GoalsSEDARIUS, POLEN ([id number]) [id number] Page 5 of 8 Patient will verbalize adequate pain control and receive pain control interventions during procedures as needed Date Initiated: 08/04/2021 Target Resolution Date: 01/10/2023 Goal Status: Active Patient/caregiver will verbalize comfort level met Date Initiated: 08/04/2021 Date Inactivated: 07/27/2022 Target Resolution Date: 09/10/2022 Goal Status: Met Interventions: Encourage patient to take pain medications as prescribed Provide education on pain management Reposition patient for comfort Treatment Activities: Administer pain control measures as ordered : 08/04/2021 Notes: Electronic Signature(s)  Signed: 11/30/2022 4:36:51 PM By: Shawn Stall RN, BSN Entered By: Shawn Stall on 11/30/2022 12:24:11 -------------------------------------------------------------------------------- Pain Assessment Details Patient Name: Date of Service: [name], [name] 11/30/2022 2:45 PM Medical Record Number: [id number] Patient Account Number: [id number] Date of Birth/Sex: Treating RN: 09-20-1955 (67 y.o. M) Primary Care Arlyss Weathersby: [name] Other Clinician: Referring  Santos Hardwick: Treating Mandi Mattioli/Extender: Mikki Santee in Treatment: 69 Active Problems Location of Pain Severity and Description of Pain Patient Has Paino No Site Locations Pain Management and Medication Current Pain Management: Electronic Signature(s) Signed: 12/02/2022 11:01:27 AM By: Thayer Dallas Entered By: Thayer Dallas on 11/30/2022 11:57:23 Tones, Ulyses [name] ([id number]) [id number] Page 6 of 8 -------------------------------------------------------------------------------- Patient/Caregiver Education Details Patient Name: Date of Service: [name], [name] 11/20/2024andnbsp2:45 PM Medical Record Number: [id number] Patient Account Number: [id number] Date of Birth/Gender: Treating RN: 1955-05-29 (67 y.o. [name] Primary Care Physician: [name] Other Clinician: Referring Physician: Treating Physician/Extender: Mikki Santee in Treatment: 66 Education Assessment Education Provided To: Patient Education Topics Provided Wound/Skin Impairment: Handouts: Caring for Your Ulcer Methods: Explain/Verbal Responses: Reinforcements needed Electronic Signature(s) Signed: 11/30/2022 4:36:51 PM By: Shawn Stall RN, BSN Entered By: Shawn Stall on 11/30/2022 12:24:23 -------------------------------------------------------------------------------- Wound Assessment Details Patient Name: Date of Service: [name], [name] 11/30/2022 2:45 PM Medical Record Number: [id number] Patient Account Number: [id number] Date of Birth/Sex: Treating RN: 07/18/1955 (67 y.o. M) Primary Care Moncerrath Berhe: [name] Other Clinician: Referring Maddax Palinkas: Treating Pina Sirianni/Extender: Mikki Santee in Treatment: 69 Wound Status Wound Number: 7 Primary Venous Leg Ulcer Etiology: Wound Location: Right, Medial Malleolus Secondary Lymphedema Wounding Event: Blister Etiology: Date Acquired: 07/21/2021 Wound  Open Weeks Of Treatment: 69 Status: Clustered Wound: No Comorbid Sleep Apnea, Arrhythmia, Congestive Heart Failure, Hypertension, History: Hypotension, Peripheral Arterial Disease, Peripheral Venous Disease, Type II Diabetes, Gout, Osteoarthritis Photos [name], [name] ([id number]) [id number] Page 7 of 8 Wound Measurements Length: (cm) 2.7 Width: (cm) 1.2 Depth: (cm) 0.1 Area: (cm) 2.545 Volume: (cm) 0.254 % Reduction in Area: 93.4% % Reduction in Volume: 99.1% Epithelialization: Small (1-33%) Tunneling: No Undermining: No Wound Description Classification: Full Thickness With Exposed Support Structures Exudate Amount: Medium Exudate Type: Serosanguineous Exudate Color: red, brown Foul Odor After Cleansing: No Slough/Fibrino Yes Wound Bed Granulation Amount: Large (67-100%) Exposed Structure Granulation Quality: Red Fascia Exposed: No Necrotic Amount: Small (1-33%) Fat Layer (Subcutaneous Tissue) Exposed: Yes Necrotic Quality: Adherent Slough Tendon Exposed: No Muscle Exposed: No Joint Exposed: No Bone Exposed: No Periwound Skin Texture Texture Color No Abnormalities Noted: No No Abnormalities Noted: No Callus: No Atrophie Blanche: No Crepitus: No Cyanosis: No Excoriation: No Ecchymosis: No Induration: No Erythema: No Rash: No Hemosiderin Staining: No Scarring: No Mottled: No Pallor: No Moisture Rubor: No No Abnormalities Noted: No Dry / Scaly: No Temperature / Pain Maceration: No Temperature: No Abnormality Treatment Notes Wound #7 (Malleolus) Wound Laterality: Right, Medial Cleanser Soap and Water Discharge Instruction: May shower and wash wound with dial antibacterial soap and water prior to dressing change. Vashe 5.8 (oz) Discharge Instruction: Cleanse the wound with Vashe prior to applying a clean dressing using gauze sponges, not tissue or cotton balls. Peri-Wound Care Sween Lotion (Moisturizing lotion) Discharge  Instruction: Apply moisturizing lotion as directed Topical Gentamicin Discharge Instruction: As directed by physician Primary Dressing Endoform 2x2 in Discharge Instruction: Moisten with saline Secondary Dressing Zetuvit Plus 4x8 in Discharge Instruction: Apply over primary dressing as directed. Secured With Compression Wrap Urgo K2, (equivalent to a 4 layer) two layer  compression system, regular Discharge Instruction: Apply Urgo K2 as directed (alternative to 4 layer compression). Compression Stockings Add-Ons Electronic Signature(s) [name], [name] ([id number]) [id number] Page 8 of 8 Signed: 12/02/2022 11:01:27 AM By: Thayer Dallas Entered By: Thayer Dallas on 11/30/2022 12:11:26 -------------------------------------------------------------------------------- Vitals Details Patient Name: Date of Service: [name], [name] 11/30/2022 2:45 PM Medical Record Number: [id number] Patient Account Number: [id number] Date of Birth/Sex: Treating RN: 1955/06/14 (67 y.o. M) Primary Care [place] Schweppe: [name] Other Clinician: Referring Rainey Rodger: Treating Daniell Mancinas/Extender: Mikki Santee in Treatment: 69 Vital Signs Time Taken: 14:56 Temperature (F): 98.4 Height (in): 71 Pulse (bpm): 71 Weight (lbs): 350 Respiratory Rate (breaths/min): 18 Body Mass Index (BMI): 48.8 Blood Pressure (mmHg): 142/88 Reference Range: 80 - 120 mg / dl Electronic Signature(s) Signed: 12/02/2022 11:01:27 AM By: Thayer Dallas Entered By: Thayer Dallas on 11/30/2022 11:57:06

## 2022-12-05 NOTE — Telephone Encounter (Signed)
Advanced Heart Failure Patient Advocate Encounter  The patient was approved for a [employer] grant that will help cover the cost of Carvedilol, Entresto, Farxiga, Spironolactone.  Total amount awarded, $10,000.  Effective: 11/05/2022 - 11/04/2023.  BIN [id number] PCN [id number] Group [id number] ID [id number]  Pharmacy provided with approval and processing information. Patient informed via phone.  Burnell Blanks, CPhT Rx Patient Advocate Phone: [phone]

## 2022-12-06 NOTE — Progress Notes (Signed)
Cardiology Office Note:  .   Date:  12/06/2022  ID:  [name], DOB 20-May-1955, MRN [id number] PCP: [name], MD  [facility] Providers Cardiologist:  Nanetta Batty, MD {  History of Present Illness: .   [name] is a 67 y.o. male w/PMHx of ILD, smoker, OSA w/CPAP, HTN, HLD, DM, NICM, chronic CHF, lymphoma (s/p CHOP in 2011), AFib (permanent), PVD (hx of left transmetatarsal amputation) and hx of recurrent/poorly healing LE wounds, CHB w/ICD   Admitted 02/2017 for acute hypoxic respiratory failure requiring intubation and ultimately tracheostomy. This was in the setting of multifocal PNA/ influenza and new onset atrial fibrillation w/ RVR. Echo showed mod LVH and severely reduced LVEF 25-30% w/ diffuse HK. RV normal. At the time, his CM was felt to be viral. Did not get cath. Per d/c summary, DCCV not pursued. Afib treated w/ rate control and Eliquis   LVEF waxed/waned, intermittently lost to f/u  Admitted 08/2021 with pre-syncope, ECG showed AF w/ SVR, HR 28 BPM, RBBB, had several 2 sec pauses. Beta blocker and other GDMT held with AKI and hypotension. He developed worsening periods of high-grade AV block/atrial fibrillation with slow ventricular response and lengthy pauses w/ hypotension. Underwent TVP with improvement in symptoms. R/LHC showed diffuse nonobstructive CAD, bi-V failure R>L, mild to moderate pulmonary venous hypertension. EP consulted and he underwent ICD. Hospitalization complicated by chronic venous stasis wounds.    Saw Dr. Gala Romney 01/04/22, recently on prednisone for pulm disease, ambulating with walker, echo this day EF 30% RV moderately down  Coreg resumed, torsemide pulsed  Saw Dr. Ladona Ridgel 03/09/22, clinically feeling better, QRS duration improved post PPM (LB area pacing), though no improvement in his EF Chronic edema, no changes were made  HF team 05/02/22, infected R ankle wound on Abx. Following with WC. Not SOB on flat ground, doing OK,  torsemide reduced appearing hypovolemic.  Today's visit is scheduled as an annual visit  ROS:   He is accompanied by his wife today He was unaware of the VT, surprised when they called to report it to him. He denies any CP, palpitations or cardiac awareness No rest SOB, + DOE Noted some bloating, LE swelling and increased DOE a couple weeks ago and increased his torsemide to 20mg  daily and has continued this  No other changes to his meds This has brought him back to near normal on his LE swelling/bloating  No bleeding or signs of bleeding    Device information MDT dual chamber ICD implanted 09/01/21 RV lead is in LB area   Studies Reviewed: [name] Kitchen    EKG not done today  DEVICE interrogation done today and reviewed by myself Battery and lead measurements are good No P or R waves today at 45 Known to be dependent Very short programmed SAV/PAV delay at 30ms (presumed intentional) One treated VT episodes (previously seen) ATP worked with a dirty break None further No AFib   01/04/22: TTE 1. Left ventricular ejection fraction, by estimation, is 30%. The left  ventricle has moderately decreased function. The left ventricle  demonstrates global hypokinesis. There is moderate left ventricular  hypertrophy. Left ventricular diastolic function  could not be evaluated.   2. Right ventricular systolic function is moderately reduced. The right  ventricular size is moderately enlarged. There is moderately elevated  pulmonary artery systolic pressure.   3. Left atrial size was moderately dilated.   4. Right atrial size was moderately dilated.   5. The mitral  valve is normal in structure. Mild mitral valve  regurgitation. No evidence of mitral stenosis.   6. The RCC of the aortic valve appears fused. The aortic valve is  tricuspid. There is mild calcification of the aortic valve. Aortic valve  regurgitation is not visualized. Aortic valve sclerosis/calcification is  present, without any  evidence of aortic  stenosis.   7. Aortic dilatation noted. There is borderline dilatation of the aortic  root, measuring 39 mm. There is mild dilatation of the ascending aorta,  measuring 40 mm.   8. The inferior vena cava is dilated in size with <50% respiratory  variability, suggesting right atrial pressure of 15 mmHg.    PYP (10/23): equivocal grade 1, H/CL = 1.06   - Echo (8/23): EF 30-35%, moderately decreased LV function, RV moderate reduced, moderate TR   - R/LHC (8/23):   Mid RCA lesion is 40% stenosed.   RPDA lesion is 95% stenosed.   Ost LM lesion is 30% stenosed.   Mid LAD lesion is 30% stenosed.   2nd Diag lesion is 50% stenosed.   Mid Cx lesion is 30% stenosed.   Ao = 109/58 (78) LV = 119/19 RA =  21 RV = 57/19 PA = 56/22 (36) PCW = 24 (v = 30) Fick cardiac output/index = 7.3/2.7 PVR = 1.8 WU Ao sat = 99% PA sat = 68%, 69% PAPi = 2.1   1. Mostly non-obstructive CAD with high-grade lesion in mid to distal RPDA 2. Nonischemic CM  3. Mild to moderate pulmonary venous HTN with evidence of RV dysfunction   Plan/Discussion: Continue medical therapy. If patient develops angina can consider PCI of PDA; otherwise medical therapy.   Risk Assessment/Calculations:    Physical Exam:   VS:  There were no vitals taken for this visit.   Wt Readings from Last 3 Encounters:  09/26/22 (!) 333 lb 3.2 oz (151.1 kg)  05/02/22 (!) 310 lb (140.6 kg)  04/18/22 (!) 307 lb 12.8 oz (139.6 kg)    GEN: Well nourished, well developed in no acute distress NECK: No JVD; No carotid bruits CARDIAC: RRR, no murmurs, rubs, gallops RESPIRATORY:  CTA b/l without rales, wheezing or rhonchi  ABDOMEN: Soft, non-tender, non-distended EXTREMITIES:  RLE is wrapped, chronic looking skin changes, below shin fairly firm edema (re[ported as good for him); No deformity  B/l forearm plaque appearing rash, circular, various sizes  ICD site: is stable, no thinning, fluctuation,  tethering  ASSESSMENT AND PLAN: .    ICD Intact function RV output increased to 2.5V (clinic standard)  Paroxysmal Afib CHA2DS2Vasc is 4, on Eliquis, appropriately dosed Zero burden  NICM Chronic CHF (systolic) OptiVol back down (though had been up) Will continue the 20mg  torsemide for now, get labs today to giude any changes needed Overdue to see the HF team  Secondary hypercoagulable state 2/2 AFib   Dispo: remotes as usual, in clinic again with EP in 61mo, sooner if needed  Signed, Sheilah Pigeon, PA-C

## 2022-12-07 ENCOUNTER — Encounter (HOSPITAL_BASED_OUTPATIENT_CLINIC_OR_DEPARTMENT_OTHER): Payer: Medicare Other | Admitting: Physician Assistant

## 2022-12-07 DIAGNOSIS — I509 Heart failure, unspecified: Secondary | ICD-10-CM | POA: Diagnosis not present

## 2022-12-07 DIAGNOSIS — L97818 Non-pressure chronic ulcer of other part of right lower leg with other specified severity: Secondary | ICD-10-CM | POA: Diagnosis not present

## 2022-12-07 DIAGNOSIS — E1151 Type 2 diabetes mellitus with diabetic peripheral angiopathy without gangrene: Secondary | ICD-10-CM | POA: Diagnosis not present

## 2022-12-07 DIAGNOSIS — I110 Hypertensive heart disease with heart failure: Secondary | ICD-10-CM | POA: Diagnosis not present

## 2022-12-07 DIAGNOSIS — I2510 Atherosclerotic heart disease of native coronary artery without angina pectoris: Secondary | ICD-10-CM | POA: Diagnosis not present

## 2022-12-07 DIAGNOSIS — Z7901 Long term (current) use of anticoagulants: Secondary | ICD-10-CM | POA: Diagnosis not present

## 2022-12-07 DIAGNOSIS — I872 Venous insufficiency (chronic) (peripheral): Secondary | ICD-10-CM | POA: Diagnosis not present

## 2022-12-07 DIAGNOSIS — I890 Lymphedema, not elsewhere classified: Secondary | ICD-10-CM | POA: Diagnosis not present

## 2022-12-07 DIAGNOSIS — I87333 Chronic venous hypertension (idiopathic) with ulcer and inflammation of bilateral lower extremity: Secondary | ICD-10-CM | POA: Diagnosis not present

## 2022-12-07 DIAGNOSIS — E11621 Type 2 diabetes mellitus with foot ulcer: Secondary | ICD-10-CM | POA: Diagnosis not present

## 2022-12-07 DIAGNOSIS — L97828 Non-pressure chronic ulcer of other part of left lower leg with other specified severity: Secondary | ICD-10-CM | POA: Diagnosis not present

## 2022-12-07 DIAGNOSIS — L97518 Non-pressure chronic ulcer of other part of right foot with other specified severity: Secondary | ICD-10-CM | POA: Diagnosis not present

## 2022-12-07 NOTE — Progress Notes (Signed)
MACLEAN, WAS ([id number]) [id number] Page 1 of 4 Visit Report for 12/07/2022 Arrival Information Details Patient Name: Date of Service: [name], SCHOENBACHLER 12/07/2022 2:15 PM Medical Record Number: [id number] Patient Account Number: [id number] Date of Birth/Sex: Treating RN: 09-14-1955 (67 y.o. M) Primary Care Nathanie Ottley: [name] Other Clinician: Shawn Stall Referring Sage Kopera: Treating Charnika Herbst/Extender: Alver Fisher in Treatment: 59 Visit Information History Since Last Visit Added or deleted any medications: No Patient Arrived: [name] Any new allergies or adverse reactions: No Arrival Time: 14:20 Had a fall or experienced change in No Accompanied By: self activities of daily living that may affect Transfer Assistance: None risk of falls: Patient Identification Verified: Yes Signs or symptoms of abuse/neglect since last visito No Secondary Verification Process Completed: Yes Hospitalized since last visit: No Patient Requires Transmission-Based Precautions: No Implantable device outside of the clinic excluding No Patient Has Alerts: Yes cellular tissue based products placed in the center Patient Alerts: Patient on Blood Thinner since last visit: 01/2021 ABI L 1.13 R 1.06 Has Dressing in Place as Prescribed: Yes 01/2021 TBI L amp R 0.8 Has Compression in Place as Prescribed: Yes Pain Present Now: No Electronic Signature(s) Signed: 12/07/2022 3:03:06 PM By: Thayer Dallas Entered By: Thayer Dallas on 12/07/2022 11:21:22 -------------------------------------------------------------------------------- Compression Therapy Details Patient Name: Date of Service: [name], [name] [name]. 12/07/2022 2:15 PM Medical Record Number: [id number] Patient Account Number: [id number] Date of Birth/Sex: Treating RN: 1955/10/17 (67 y.o. M) Primary Care Granvil Djordjevic: [name] Other Clinician: Referring Anaily Ashbaugh: Treating Randye Treichler/Extender: Alver Fisher in Treatment: 70 Compression Therapy Performed for Wound Assessment: Wound #7 Right,Medial Malleolus Performed By: Clinician Thayer Dallas, Compression Type: Double Layer Electronic Signature(s) Signed: 12/07/2022 3:03:06 PM By: Thayer Dallas Entered By: Thayer Dallas on 12/07/2022 12:01:47 -------------------------------------------------------------------------------- Encounter Discharge Information Details Patient Name: Date of Service: [name], [name] 12/07/2022 2:15 PM Medical Record Number: [id number] Patient Account Number: [id number] JONCARLO, TUCCIARONE ([id number]) [phone].pdf Page 2 of 4 Date of Birth/Sex: Treating RN: 29-Mar-1955 (67 y.o. M) Primary Care Antasia Haider: Other Clinician: Maximino Sarin Referring Jarred Purtee: Treating Monque Haggar/Extender: Alver Fisher in Treatment: 53 Encounter Discharge Information Items Discharge Condition: Stable Ambulatory Status: Walker Discharge Destination: Home Transportation: Private Auto Accompanied By: self Schedule Follow-up Appointment: Yes Clinical Summary of Care: Electronic Signature(s) Signed: 12/07/2022 3:03:06 PM By: Thayer Dallas Entered By: Thayer Dallas on 12/07/2022 12:02:31 -------------------------------------------------------------------------------- Patient/Caregiver Education Details Patient Name: Date of Service: [name] 11/27/2024andnbsp2:15 PM Medical Record Number: [id number] Patient Account Number: [id number] Date of Birth/Gender: Treating RN: April 10, 1955 (67 y.o. M) Primary Care Physician: [name] Other Clinician: Thayer Dallas Referring Physician: Treating Physician/Extender: Alver Fisher in Treatment: 60 Education Assessment Education Provided To: Patient Education Topics Provided Electronic Signature(s) Signed: 12/07/2022 3:03:06 PM By: Thayer Dallas Entered By: Thayer Dallas  on 12/07/2022 12:02:11 -------------------------------------------------------------------------------- Wound Assessment Details Patient Name: Date of Service: [name], [name] [name] Neptune D. 12/07/2022 2:15 PM Medical Record Number: [id number] Patient Account Number: [id number] Date of Birth/Sex: Treating RN: 01-23-1955 (67 y.o. M) Primary Care Ailene Royal: [name] Other Clinician: Referring Artemus Romanoff: Treating Naylene Foell/Extender: Alver Fisher in Treatment: 70 Wound Status Wound Number: 7 Primary Etiology: Venous Leg Ulcer Wound Location: Right, Medial Malleolus Secondary Etiology: Lymphedema Wounding Event: Blister Wound Status: Open Date Acquired: 07/21/2021 Weeks Of Treatment: 70 Clustered Wound: No Wound Measurements Desjardin, Prajwal D ([id number]) Length: (cm) 2.7 Width: (cm) 1.2 Depth: (cm) 0.1 Area: (cm)  2.545 Volume: (cm) 0.254 [id number] Page 3 of 4 % Reduction in Area: 93.4% % Reduction in Volume: 99.1% Wound Description Classification: Full Thickness With Exposed Support Exudate Amount: Medium Exudate Type: Serosanguineous Exudate Color: red, brown Structures Periwound Skin Texture Texture Color No Abnormalities Noted: No No Abnormalities Noted: No Moisture No Abnormalities Noted: No Treatment Notes Wound #7 (Malleolus) Wound Laterality: Right, Medial Cleanser Soap and Water Discharge Instruction: May shower and wash wound with dial antibacterial soap and water prior to dressing change. Vashe 5.8 (oz) Discharge Instruction: Cleanse the wound with Vashe prior to applying a clean dressing using gauze sponges, not tissue or cotton balls. Peri-Wound Care Sween Lotion (Moisturizing lotion) Discharge Instruction: Apply moisturizing lotion as directed Topical Gentamicin Discharge Instruction: As directed by physician Primary Dressing Endoform 2x2 in Discharge Instruction: Moisten with saline Secondary Dressing Zetuvit Plus  4x8 in Discharge Instruction: Apply over primary dressing as directed. Secured With Compression Wrap Urgo K2, (equivalent to a 4 layer) two layer compression system, regular Discharge Instruction: Apply Urgo K2 as directed (alternative to 4 layer compression). Compression Stockings Add-Ons Electronic Signature(s) Signed: 12/07/2022 3:03:06 PM By: Thayer Dallas Entered By: Thayer Dallas on 12/07/2022 12:01:25 -------------------------------------------------------------------------------- Vitals Details Patient Name: Date of Service: Fuhr, [name] 12/07/2022 2:15 PM Medical Record Number: [id number] Patient Account Number: [id number] Date of Birth/Sex: Treating RN: [birth year] (67 y.o. M) Primary Care Kemper Heupel: [name] Other Clinician: Referring Jamicia Haaland: Treating Kirstine Jacquin/Extender: Alver Fisher in Treatment: [address] [place] D ([id number]) [id number] Page 4 of 4 Vital Signs Time Taken: 15:00 Reference Range: 80 - 120 mg / dl Height (in): 71 Weight (lbs): 350 Body Mass Index (BMI): 48.8 Electronic Signature(s) Signed: 12/07/2022 3:03:06 PM By: Thayer Dallas Entered By: Thayer Dallas on 12/07/2022 12:01:16

## 2022-12-12 ENCOUNTER — Encounter: Payer: Self-pay | Admitting: Physician Assistant

## 2022-12-12 ENCOUNTER — Ambulatory Visit: Payer: Medicare Other | Attending: Physician Assistant | Admitting: Physician Assistant

## 2022-12-12 VITALS — BP 138/60 | HR 80 | Ht 71.0 in | Wt 343.0 lb

## 2022-12-12 DIAGNOSIS — I428 Other cardiomyopathies: Secondary | ICD-10-CM | POA: Diagnosis not present

## 2022-12-12 DIAGNOSIS — R001 Bradycardia, unspecified: Secondary | ICD-10-CM

## 2022-12-12 DIAGNOSIS — I5022 Chronic systolic (congestive) heart failure: Secondary | ICD-10-CM

## 2022-12-12 DIAGNOSIS — I480 Paroxysmal atrial fibrillation: Secondary | ICD-10-CM

## 2022-12-12 DIAGNOSIS — Z95810 Presence of automatic (implantable) cardiac defibrillator: Secondary | ICD-10-CM

## 2022-12-12 LAB — CUP PACEART INCLINIC DEVICE CHECK
Date Time Interrogation Session: 20241202152958
Implantable Lead Connection Status: 753985
Implantable Lead Connection Status: 753985
Implantable Lead Implant Date: 20230823
Implantable Lead Implant Date: 20230823
Implantable Lead Location: 753859
Implantable Lead Location: 753860
Implantable Lead Model: 3830
Implantable Lead Model: 6935
Implantable Pulse Generator Implant Date: 20230823

## 2022-12-12 MED ORDER — TORSEMIDE 20 MG PO TABS
20.0000 mg | ORAL_TABLET | Freq: Every day | ORAL | 3 refills | Status: DC
Start: 2022-12-12 — End: 2023-01-30

## 2022-12-12 NOTE — Patient Instructions (Addendum)
Medication Instructions:   START TAKING:  TORSEMIDE 20 MG ONCE A DAY   *If you need a refill on your cardiac medications before your next appointment, please call your pharmacy*   Lab Work:  BMET / CBC TODAY   If you have labs (blood work) drawn today and your tests are completely normal, you will receive your results only by: MyChart Message (if you have MyChart) OR A paper copy in the mail If you have any lab test that is abnormal or we need to change your treatment, we will call you to review the results.   Testing/Procedures: NONE ORDERED  TODAY    Follow-Up: At [facility], you and your health needs are our priority.  As part of our continuing mission to provide you with exceptional heart care, we have created designated Provider Care Teams.  These Care Teams include your primary Cardiologist (physician) and Advanced Practice Providers (APPs -  Physician Assistants and Nurse Practitioners) who all work together to provide you with the care you need, when you need it.  We recommend signing up for the patient portal called "MyChart".  Sign up information is provided on this After Visit Summary.  MyChart is used to connect with patients for Virtual Visits (Telemedicine).  Patients are able to view lab/test results, encounter notes, upcoming appointments, etc.  Non-urgent messages can be sent to your provider as well.   To learn more about what you can do with MyChart, go to [email or web address].    Your next appointment:  NEXT  1-2 WEEKS WITH HEART FAILURE  6 month(s)  Provider:   You may see Dr. Ladona Ridgel  or one of the following Advanced Practice Providers on your designated Care Team:   Francis Dowse, [place]  Other Instructions

## 2022-12-14 ENCOUNTER — Encounter (HOSPITAL_BASED_OUTPATIENT_CLINIC_OR_DEPARTMENT_OTHER): Payer: Medicare Other | Attending: Internal Medicine | Admitting: Internal Medicine

## 2022-12-14 DIAGNOSIS — E11621 Type 2 diabetes mellitus with foot ulcer: Secondary | ICD-10-CM | POA: Diagnosis not present

## 2022-12-14 DIAGNOSIS — I110 Hypertensive heart disease with heart failure: Secondary | ICD-10-CM | POA: Diagnosis not present

## 2022-12-14 DIAGNOSIS — Z89432 Acquired absence of left foot: Secondary | ICD-10-CM | POA: Diagnosis not present

## 2022-12-14 DIAGNOSIS — I890 Lymphedema, not elsewhere classified: Secondary | ICD-10-CM | POA: Diagnosis not present

## 2022-12-14 DIAGNOSIS — L97818 Non-pressure chronic ulcer of other part of right lower leg with other specified severity: Secondary | ICD-10-CM | POA: Insufficient documentation

## 2022-12-14 DIAGNOSIS — I87333 Chronic venous hypertension (idiopathic) with ulcer and inflammation of bilateral lower extremity: Secondary | ICD-10-CM | POA: Insufficient documentation

## 2022-12-14 DIAGNOSIS — E1151 Type 2 diabetes mellitus with diabetic peripheral angiopathy without gangrene: Secondary | ICD-10-CM | POA: Insufficient documentation

## 2022-12-14 DIAGNOSIS — L97828 Non-pressure chronic ulcer of other part of left lower leg with other specified severity: Secondary | ICD-10-CM | POA: Diagnosis not present

## 2022-12-14 DIAGNOSIS — I2510 Atherosclerotic heart disease of native coronary artery without angina pectoris: Secondary | ICD-10-CM | POA: Diagnosis not present

## 2022-12-14 DIAGNOSIS — L97518 Non-pressure chronic ulcer of other part of right foot with other specified severity: Secondary | ICD-10-CM | POA: Diagnosis not present

## 2022-12-14 DIAGNOSIS — I509 Heart failure, unspecified: Secondary | ICD-10-CM | POA: Insufficient documentation

## 2022-12-19 ENCOUNTER — Ambulatory Visit (INDEPENDENT_AMBULATORY_CARE_PROVIDER_SITE_OTHER): Payer: Medicare Other

## 2022-12-19 DIAGNOSIS — I428 Other cardiomyopathies: Secondary | ICD-10-CM | POA: Diagnosis not present

## 2022-12-19 DIAGNOSIS — I5022 Chronic systolic (congestive) heart failure: Secondary | ICD-10-CM

## 2022-12-19 NOTE — Progress Notes (Signed)
VALERIA, BENEZRA ([id number]) [id number] Page 1 of 1 Visit Report for 12/14/2022 SuperBill Details Patient Name: Date of Service: [name], [name] 12/14/2022 Medical Record Number: [id number] Patient Account Number: [id number] Date of Birth/Sex: Treating RN: 09-30-1955 (67 y.o. M) Primary Care Provider: [name] Other Clinician: Thayer Dallas Referring Provider: Treating Provider/Extender: Mikki Santee in Treatment: 71 Diagnosis Coding ICD-10 Codes Code Description I89.0 Lymphedema, not elsewhere classified I87.333 Chronic venous hypertension (idiopathic) with ulcer and inflammation of bilateral lower extremity L97.828 Non-pressure chronic ulcer of other part of left lower leg with other specified severity L97.818 Non-pressure chronic ulcer of other part of right lower leg with other specified severity E11.621 Type 2 diabetes mellitus with foot ulcer L97.518 Non-pressure chronic ulcer of other part of right foot with other specified severity Facility Procedures CPT4 Code Description Modifier Quantity 86578469 (Facility Use Only) [id number] - APPLY MULTLAY COMPRS LWR RT LEG 1 Electronic Signature(s) Signed: 12/15/2022 4:45:28 AM By: Baltazar Najjar MD Signed: 12/19/2022 4:12:01 PM By: Thayer Dallas Entered By: Thayer Dallas on 12/14/2022 16:34:43

## 2022-12-19 NOTE — Progress Notes (Signed)
MARKEISE, [name] ([id number]) [id number] Page 1 of 4 Visit Report for 12/14/2022 Arrival Information Details Patient Name: Date of Service: [name], [name] 12/14/2022 2:45 PM Medical Record Number: [id number] Patient Account Number: [id number] Date of Birth/Sex: Treating RN: [birth year] (67 y.o. M) Primary Care Gwendolynn Merkey: [name] Other Clinician: Thayer Dallas Referring Aleen Marston: Treating Khalon Cansler/Extender: Mikki Santee in Treatment: 83 Visit Information History Since Last Visit Added or deleted any medications: No Patient Arrived: [name] Any new allergies or adverse reactions: No Arrival Time: 14:28 Had a fall or experienced change in No Accompanied By: self activities of daily living that may affect Transfer Assistance: None risk of falls: Patient Identification Verified: Yes Signs or symptoms of abuse/neglect since last visito No Secondary Verification Process Completed: Yes Hospitalized since last visit: No Patient Requires Transmission-Based Precautions: No Implantable device outside of the clinic excluding No Patient Has Alerts: Yes cellular tissue based products placed in the center Patient Alerts: Patient on Blood Thinner since last visit: 01/2021 ABI L 1.13 R 1.06 Has Dressing in Place as Prescribed: Yes 01/2021 TBI L amp R 0.8 Has Compression in Place as Prescribed: Yes Pain Present Now: No Electronic Signature(s) Signed: 12/19/2022 4:12:01 PM By: Thayer Dallas Entered By: Thayer Dallas on 12/14/2022 16:32:41 -------------------------------------------------------------------------------- Compression Therapy Details Patient Name: Date of Service: [name] Craven [name]. 12/14/2022 2:45 PM Medical Record Number: [id number] Patient Account Number: [id number] Date of Birth/Sex: Treating RN: 1955-08-11 (67 y.o. M) Primary Care Abdelaziz Westenberger: [name] Other Clinician: Referring Everlynn Sagun: Treating Norabelle Kondo/Extender: Mikki Santee in Treatment: 32 Compression Therapy Performed for Wound Assessment: Wound #7 Right,Medial Malleolus Performed By: Clinician Thayer Dallas, Compression Type: Double Layer Electronic Signature(s) Signed: 12/19/2022 4:12:01 PM By: Thayer Dallas Entered By: Thayer Dallas on 12/14/2022 16:33:34 -------------------------------------------------------------------------------- Encounter Discharge Information Details Patient Name: Date of Service: [name], [name] 12/14/2022 2:45 PM Medical Record Number: [id number] Patient Account Number: [id number] GOHAN, TURIN ([id number]) [id number] Page 2 of 4 Date of Birth/Sex: Treating RN: Jun 18, 1955 (67 y.o. M) Primary Care Hasani Diemer: Other Clinician: Maximino Sarin Referring Nel Stoneking: Treating Gwyndolyn Guilford/Extender: Mikki Santee in Treatment: 42 Encounter Discharge Information Items Discharge Condition: Stable Ambulatory Status: Walker Discharge Destination: Home Transportation: Private Auto Accompanied By: self Schedule Follow-up Appointment: Yes Clinical Summary of Care: Electronic Signature(s) Signed: 12/19/2022 4:12:01 PM By: Thayer Dallas Entered By: Thayer Dallas on 12/14/2022 16:34:15 -------------------------------------------------------------------------------- Patient/Caregiver Education Details Patient Name: Date of Service: [name] Craven [name]. 12/4/2024andnbsp2:45 PM Medical Record Number: [id number] Patient Account Number: [id number] Date of Birth/Gender: Treating RN: March 04, 1955 (67 y.o. M) Primary Care Physician: [name] Other Clinician: Thayer Dallas Referring Physician: Treating Physician/Extender: Mikki Santee in Treatment: 57 Education Assessment Education Provided To: Patient Education Topics Provided Electronic Signature(s) Signed: 12/19/2022 4:12:01 PM By: Thayer Dallas Entered By: Thayer Dallas on  12/14/2022 16:33:59 -------------------------------------------------------------------------------- Wound Assessment Details Patient Name: Date of Service: [name], [name] Grooms [name]. 12/14/2022 2:45 PM Medical Record Number: [id number] Patient Account Number: [id number] Date of Birth/Sex: Treating RN: 1955/07/20 (67 y.o. M) Primary Care Ario Mcdiarmid: [name] Other Clinician: Referring Tamala Manzer: Treating Victorio Creeden/Extender: Mikki Santee in Treatment: 71 Wound Status Wound Number: 7 Primary Etiology: Venous Leg Ulcer Wound Location: Right, Medial Malleolus Secondary Etiology: Lymphedema Wounding Event: Blister Wound Status: Open Date Acquired: 07/21/2021 Weeks Of Treatment: 71 Clustered Wound: No Wound Measurements [name], [name] ([id number]) Length: (cm) 2.7 Width: (cm) 1.2 Depth: (cm) 0.1 Area: (cm) 2.545 Volume: (cm) 0.254 [id number]  Page 3 of 4 % Reduction in Area: 93.4% % Reduction in Volume: 99.1% Wound Description Classification: Full Thickness With Exposed Support Exudate Amount: Medium Exudate Type: Serosanguineous Exudate Color: red, brown Structures Periwound Skin Texture Texture Color No Abnormalities Noted: No No Abnormalities Noted: No Moisture No Abnormalities Noted: No Treatment Notes Wound #7 (Malleolus) Wound Laterality: Right, Medial Cleanser Soap and Water Discharge Instruction: May shower and wash wound with dial antibacterial soap and water prior to dressing change. Vashe 5.8 (oz) Discharge Instruction: Cleanse the wound with Vashe prior to applying a clean dressing using gauze sponges, not tissue or cotton balls. Peri-Wound Care Sween Lotion (Moisturizing lotion) Discharge Instruction: Apply moisturizing lotion as directed Topical Gentamicin Discharge Instruction: As directed by physician Primary Dressing Endoform 2x2 in Discharge Instruction: Moisten with saline Secondary Dressing Zetuvit Plus 4x8  in Discharge Instruction: Apply over primary dressing as directed. Secured With Compression Wrap Urgo K2, (equivalent to a 4 layer) two layer compression system, regular Discharge Instruction: Apply Urgo K2 as directed (alternative to 4 layer compression). Compression Stockings Add-Ons Electronic Signature(s) Signed: 12/19/2022 4:12:01 PM By: Thayer Dallas Entered By: Thayer Dallas on 12/14/2022 16:33:01 -------------------------------------------------------------------------------- Vitals Details Patient Name: Date of Service: [name], [name] 12/14/2022 2:45 PM Medical Record Number: [id number] Patient Account Number: [id number] Date of Birth/Sex: Treating RN: Feb 08, 1955 (67 y.o. M) Primary Care Barbette Mcglaun: [name] Other Clinician: Thayer Dallas Referring Zoi Devine: Treating Boysie Bonebrake/Extender: Mikki Santee in Treatment: [address], [name] ([id number]) [phone].pdf Page 4 of 4 Vital Signs Time Taken: 16:30 Reference Range: 80 - 120 mg / dl Height (in): 71 Weight (lbs): 350 Body Mass Index (BMI): 48.8 Electronic Signature(s) Signed: 12/19/2022 4:12:01 PM By: Thayer Dallas Entered By: Thayer Dallas on 12/14/2022 16:32:52

## 2022-12-20 DIAGNOSIS — I10 Essential (primary) hypertension: Secondary | ICD-10-CM | POA: Diagnosis not present

## 2022-12-20 DIAGNOSIS — R82998 Other abnormal findings in urine: Secondary | ICD-10-CM | POA: Diagnosis not present

## 2022-12-20 DIAGNOSIS — Z0000 Encounter for general adult medical examination without abnormal findings: Secondary | ICD-10-CM | POA: Diagnosis not present

## 2022-12-20 DIAGNOSIS — I110 Hypertensive heart disease with heart failure: Secondary | ICD-10-CM | POA: Diagnosis not present

## 2022-12-20 DIAGNOSIS — E1151 Type 2 diabetes mellitus with diabetic peripheral angiopathy without gangrene: Secondary | ICD-10-CM | POA: Diagnosis not present

## 2022-12-20 DIAGNOSIS — Z23 Encounter for immunization: Secondary | ICD-10-CM | POA: Diagnosis not present

## 2022-12-20 DIAGNOSIS — Z1331 Encounter for screening for depression: Secondary | ICD-10-CM | POA: Diagnosis not present

## 2022-12-20 DIAGNOSIS — Z1339 Encounter for screening examination for other mental health and behavioral disorders: Secondary | ICD-10-CM | POA: Diagnosis not present

## 2022-12-20 LAB — CUP PACEART REMOTE DEVICE CHECK
Battery Remaining Longevity: 75 mo
Battery Voltage: 3 V
Brady Statistic AP VP Percent: 99.5 %
Brady Statistic AP VS Percent: 0 %
Brady Statistic AS VP Percent: 0.02 %
Brady Statistic AS VS Percent: 0.49 %
Brady Statistic RA Percent Paced: 97.98 %
Brady Statistic RV Percent Paced: 98.06 %
Date Time Interrogation Session: 20241209022823
HighPow Impedance: 96 Ohm
Implantable Lead Connection Status: 753985
Implantable Lead Connection Status: 753985
Implantable Lead Implant Date: 20230823
Implantable Lead Implant Date: 20230823
Implantable Lead Location: 753859
Implantable Lead Location: 753860
Implantable Lead Model: 3830
Implantable Lead Model: 6935
Implantable Pulse Generator Implant Date: 20230823
Lead Channel Impedance Value: 304 Ohm
Lead Channel Impedance Value: 437 Ohm
Lead Channel Impedance Value: 456 Ohm
Lead Channel Sensing Intrinsic Amplitude: 10.75 mV
Lead Channel Sensing Intrinsic Amplitude: 10.75 mV
Lead Channel Sensing Intrinsic Amplitude: 3.125 mV
Lead Channel Sensing Intrinsic Amplitude: 3.125 mV
Lead Channel Setting Pacing Amplitude: 1.5 V
Lead Channel Setting Pacing Amplitude: 2.5 V
Lead Channel Setting Pacing Pulse Width: 0.4 ms
Lead Channel Setting Sensing Sensitivity: 0.3 mV
Zone Setting Status: 755011

## 2022-12-21 ENCOUNTER — Encounter (HOSPITAL_BASED_OUTPATIENT_CLINIC_OR_DEPARTMENT_OTHER): Payer: Medicare Other | Admitting: Internal Medicine

## 2022-12-21 DIAGNOSIS — Z89432 Acquired absence of left foot: Secondary | ICD-10-CM | POA: Diagnosis not present

## 2022-12-21 DIAGNOSIS — L97518 Non-pressure chronic ulcer of other part of right foot with other specified severity: Secondary | ICD-10-CM | POA: Diagnosis not present

## 2022-12-21 DIAGNOSIS — I509 Heart failure, unspecified: Secondary | ICD-10-CM | POA: Diagnosis not present

## 2022-12-21 DIAGNOSIS — L97818 Non-pressure chronic ulcer of other part of right lower leg with other specified severity: Secondary | ICD-10-CM | POA: Diagnosis not present

## 2022-12-21 DIAGNOSIS — I2510 Atherosclerotic heart disease of native coronary artery without angina pectoris: Secondary | ICD-10-CM | POA: Diagnosis not present

## 2022-12-21 DIAGNOSIS — I890 Lymphedema, not elsewhere classified: Secondary | ICD-10-CM | POA: Diagnosis not present

## 2022-12-21 DIAGNOSIS — I110 Hypertensive heart disease with heart failure: Secondary | ICD-10-CM | POA: Diagnosis not present

## 2022-12-21 DIAGNOSIS — E11621 Type 2 diabetes mellitus with foot ulcer: Secondary | ICD-10-CM | POA: Diagnosis not present

## 2022-12-21 DIAGNOSIS — E1151 Type 2 diabetes mellitus with diabetic peripheral angiopathy without gangrene: Secondary | ICD-10-CM | POA: Diagnosis not present

## 2022-12-21 DIAGNOSIS — L97828 Non-pressure chronic ulcer of other part of left lower leg with other specified severity: Secondary | ICD-10-CM | POA: Diagnosis not present

## 2022-12-21 DIAGNOSIS — I87333 Chronic venous hypertension (idiopathic) with ulcer and inflammation of bilateral lower extremity: Secondary | ICD-10-CM | POA: Diagnosis not present

## 2022-12-22 NOTE — Progress Notes (Signed)
GRAIG, CHARLESTON ([id number]) [id number] Page 1 of 4 Visit Report for 12/21/2022 Arrival Information Details Patient Name: Date of Service: CALYB, SIMONSON 12/21/2022 3:00 PM Medical Record Number: [id number] Patient Account Number: [id number] Date of Birth/Sex: Treating RN: [birth year] (67 y.o. M) Primary Care Caleigh Rabelo: [name] Other Clinician: Thayer Dallas Referring Kaushal Vannice: Treating Shirle Provencal/Extender: Mikki Santee in Treatment: 72 Visit Information History Since Last Visit Added or deleted any medications: No Patient Arrived: [name] Any new allergies or adverse reactions: No Arrival Time: 14:42 Had a fall or experienced change in No Accompanied By: self activities of daily living that may affect Transfer Assistance: None risk of falls: Patient Identification Verified: Yes Signs or symptoms of abuse/neglect since last visito No Secondary Verification Process Completed: Yes Hospitalized since last visit: No Patient Requires Transmission-Based Precautions: No Implantable device outside of the clinic excluding No Patient Has Alerts: Yes cellular tissue based products placed in the center Patient Alerts: Patient on Blood Thinner since last visit: 01/2021 ABI L 1.13 R 1.06 Has Dressing in Place as Prescribed: Yes 01/2021 TBI L amp R 0.8 Has Compression in Place as Prescribed: Yes Pain Present Now: No Electronic Signature(s) Signed: 12/21/2022 5:06:36 PM By: Thayer Dallas Entered By: Thayer Dallas on 12/21/2022 11:43:25 -------------------------------------------------------------------------------- Compression Therapy Details Patient Name: Date of Service: Marcum, [name] 12/21/2022 3:00 PM Medical Record Number: [id number] Patient Account Number: [id number] Date of Birth/Sex: Treating RN: 12/23/1955 (67 y.o. M) Primary Care Jester Klingberg: [name] Other Clinician: Referring Jonthan Leite: Treating Leldon Steege/Extender: Mikki Santee in Treatment: 72 Compression Therapy Performed for Wound Assessment: Wound #7 Right,Medial Malleolus Performed By: Clinician Thayer Dallas, Compression Type: Double Layer Electronic Signature(s) Signed: 12/21/2022 5:06:36 PM By: Thayer Dallas Entered By: Thayer Dallas on 12/21/2022 14:04:45 -------------------------------------------------------------------------------- Encounter Discharge Information Details Patient Name: Date of Service: Guerrini, [name] 12/21/2022 3:00 PM Medical Record Number: [id number] Patient Account Number: [id number] EASTER, ARONOW ([id number]) [id number] Page 2 of 4 Date of Birth/Sex: Treating RN: 1955-06-03 (67 y.o. M) Primary Care Angelise Petrich: Other Clinician: Maximino Sarin Referring Rebbecca Osuna: Treating Meg Niemeier/Extender: Mikki Santee in Treatment: 72 Encounter Discharge Information Items Discharge Condition: Stable Ambulatory Status: Walker Discharge Destination: Home Transportation: Private Auto Accompanied By: self Schedule Follow-up Appointment: Yes Clinical Summary of Care: Electronic Signature(s) Signed: 12/21/2022 5:06:36 PM By: Thayer Dallas Entered By: Thayer Dallas on 12/21/2022 14:05:28 -------------------------------------------------------------------------------- Patient/Caregiver Education Details Patient Name: Date of Service: Hardin Callas 12/11/2024andnbsp3:00 PM Medical Record Number: [id number] Patient Account Number: [id number] Date of Birth/Gender: Treating RN: Dec 30, 1955 (67 y.o. M) Primary Care Physician: [name] Other Clinician: Thayer Dallas Referring Physician: Treating Physician/Extender: Mikki Santee in Treatment: 42 Education Assessment Education Provided To: Patient Education Topics Provided Electronic Signature(s) Signed: 12/21/2022 5:06:36 PM By: Thayer Dallas Entered By: Thayer Dallas on 12/21/2022 14:05:08 -------------------------------------------------------------------------------- Wound Assessment Details Patient Name: Date of Service: Batrez, [name] 12/21/2022 3:00 PM Medical Record Number: [id number] Patient Account Number: [id number] Date of Birth/Sex: Treating RN: 04-25-1955 (67 y.o. M) Primary Care Deshanae Lindo: [name] Other Clinician: Referring Deepti Gunawan: Treating Seva Chancy/Extender: Mikki Santee in Treatment: 72 Wound Status Wound Number: 7 Primary Etiology: Venous Leg Ulcer Wound Location: Right, Medial Malleolus Secondary Etiology: Lymphedema Wounding Event: Blister Wound Status: Open Date Acquired: 07/21/2021 Weeks Of Treatment: 72 Clustered Wound: No Wound Measurements Tomaso, Tyrelle D ([id number]) Length: (cm) 2.7 Width: (cm) 1.2 Depth: (cm) 0.1 Area: (cm) 2.545 Volume: (cm) 0.254 [id number]  Page 3 of 4 % Reduction in Area: 93.4% % Reduction in Volume: 99.1% Wound Description Classification: Full Thickness With Exposed Support Exudate Amount: Medium Exudate Type: Serosanguineous Exudate Color: red, brown Structures Periwound Skin Texture Texture Color No Abnormalities Noted: No No Abnormalities Noted: No Moisture No Abnormalities Noted: No Treatment Notes Wound #7 (Malleolus) Wound Laterality: Right, Medial Cleanser Soap and Water Discharge Instruction: May shower and wash wound with dial antibacterial soap and water prior to dressing change. Vashe 5.8 (oz) Discharge Instruction: Cleanse the wound with Vashe prior to applying a clean dressing using gauze sponges, not tissue or cotton balls. Peri-Wound Care Sween Lotion (Moisturizing lotion) Discharge Instruction: Apply moisturizing lotion as directed Topical Gentamicin Discharge Instruction: As directed by physician Primary Dressing Endoform 2x2 in Discharge Instruction: Moisten with saline Secondary  Dressing Zetuvit Plus 4x8 in Discharge Instruction: Apply over primary dressing as directed. Secured With Compression Wrap Urgo K2, (equivalent to a 4 layer) two layer compression system, regular Discharge Instruction: Apply Urgo K2 as directed (alternative to 4 layer compression). Compression Stockings Add-Ons Electronic Signature(s) Signed: 12/21/2022 5:06:36 PM By: Thayer Dallas Entered By: Thayer Dallas on 12/21/2022 11:43:48 -------------------------------------------------------------------------------- Vitals Details Patient Name: Date of Service: Bacus, [name] 12/21/2022 3:00 PM Medical Record Number: [id number] Patient Account Number: [id number] Date of Birth/Sex: Treating RN: 05-29-1955 (67 y.o. M) Primary Care Deshon Hsiao: [name] Other Clinician: Referring Hawkins Seaman: Treating Sy Saintjean/Extender: Mikki Santee in Treatment: [address], [place] D ([id number]) [id number] Page 4 of 4 Vital Signs Time Taken: 14:43 Reference Range: 80 - 120 mg / dl Height (in): 71 Weight (lbs): 350 Body Mass Index (BMI): 48.8 Electronic Signature(s) Signed: 12/21/2022 5:06:36 PM By: Thayer Dallas Entered By: Thayer Dallas on 12/21/2022 11:43:34

## 2022-12-22 NOTE — Progress Notes (Signed)
CROCKETT, RIDDER ([id number]) [id number] Page 1 of 1 Visit Report for 12/21/2022 SuperBill Details Patient Name: Date of Service: [name], [name] 12/21/2022 Medical Record Number: [id number] Patient Account Number: [id number] Date of Birth/Sex: Treating RN: 1955-07-24 (67 y.o. 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

## 2022-12-23 ENCOUNTER — Other Ambulatory Visit (HOSPITAL_BASED_OUTPATIENT_CLINIC_OR_DEPARTMENT_OTHER): Payer: Self-pay | Admitting: *Deleted

## 2022-12-23 DIAGNOSIS — Z79899 Other long term (current) drug therapy: Secondary | ICD-10-CM

## 2022-12-28 ENCOUNTER — Encounter (HOSPITAL_BASED_OUTPATIENT_CLINIC_OR_DEPARTMENT_OTHER): Payer: Medicare Other | Admitting: Internal Medicine

## 2022-12-28 DIAGNOSIS — E11621 Type 2 diabetes mellitus with foot ulcer: Secondary | ICD-10-CM | POA: Diagnosis not present

## 2022-12-28 DIAGNOSIS — L97818 Non-pressure chronic ulcer of other part of right lower leg with other specified severity: Secondary | ICD-10-CM | POA: Diagnosis not present

## 2022-12-28 DIAGNOSIS — Z89432 Acquired absence of left foot: Secondary | ICD-10-CM | POA: Diagnosis not present

## 2022-12-28 DIAGNOSIS — E1151 Type 2 diabetes mellitus with diabetic peripheral angiopathy without gangrene: Secondary | ICD-10-CM | POA: Diagnosis not present

## 2022-12-28 DIAGNOSIS — L97828 Non-pressure chronic ulcer of other part of left lower leg with other specified severity: Secondary | ICD-10-CM | POA: Diagnosis not present

## 2022-12-28 DIAGNOSIS — I87333 Chronic venous hypertension (idiopathic) with ulcer and inflammation of bilateral lower extremity: Secondary | ICD-10-CM | POA: Diagnosis not present

## 2022-12-28 DIAGNOSIS — L97812 Non-pressure chronic ulcer of other part of right lower leg with fat layer exposed: Secondary | ICD-10-CM | POA: Diagnosis not present

## 2022-12-28 DIAGNOSIS — I110 Hypertensive heart disease with heart failure: Secondary | ICD-10-CM | POA: Diagnosis not present

## 2022-12-28 DIAGNOSIS — I2510 Atherosclerotic heart disease of native coronary artery without angina pectoris: Secondary | ICD-10-CM | POA: Diagnosis not present

## 2022-12-28 DIAGNOSIS — I890 Lymphedema, not elsewhere classified: Secondary | ICD-10-CM | POA: Diagnosis not present

## 2022-12-28 DIAGNOSIS — I509 Heart failure, unspecified: Secondary | ICD-10-CM | POA: Diagnosis not present

## 2022-12-28 DIAGNOSIS — L97518 Non-pressure chronic ulcer of other part of right foot with other specified severity: Secondary | ICD-10-CM | POA: Diagnosis not present

## 2022-12-28 DIAGNOSIS — I87331 Chronic venous hypertension (idiopathic) with ulcer and inflammation of right lower extremity: Secondary | ICD-10-CM | POA: Diagnosis not present

## 2022-12-29 ENCOUNTER — Ambulatory Visit: Payer: Medicare Other | Admitting: Pulmonary Disease

## 2022-12-30 NOTE — Progress Notes (Signed)
KINGDOM, DOBRIN ([id number]) [id number] Page 1 of 12 Visit Report for 12/28/2022 HPI Details Patient Name: Date of Service: [name], [name] 12/28/2022 2:45 PM Medical Record Number: [id number] Patient Account Number: [id number] Date of Birth/Sex: Treating RN: 30-Jun-1955 (67 y.o. M) Primary Care Provider: Martha [name] Other Clinician: Referring Provider: Treating Provider/Extender: [name] in Treatment: 31 History of Present Illness HPI Description: ADMISSION 03/22/2021 This is a 67 year old man with a past medical history significant for diabetes type 2, congestive heart failure, peripheral arterial disease, morbid obesity, venous insufficiency, and coronary artery disease. He has been followed by [name] in podiatry, who performed a transmetatarsal amputation on the left foot in August 2022. He had issues healing that wound, but based upon [name] notes, ultimately the TMA wound healed. During his recovery from that surgery, however, ulcers opened up over the DIP joint of the right second and third toe. These have apparently closed and reopened multiple times. It sounds like one of the issues has been moisture accumulation and maceration of the tissues causing them to reopen. At his last visit with [name], on March 01, 2021, there continues to be problems with moisture and he was referred to wound care for further evaluation and management. He had a formal aortogram with runoff performed prior to his TMA. The findings are copied here: Patient has inline flow to both feet with no significant flow-limiting lesion that would be amenable to percutaneous or open revascularization. He does have an element of small vessel disease and has a short segment occlusion of the distal anterior tibial/dorsalis pedis artery on the left foot but does have posterior tibial artery flow. Would recommend management of wounds with amputation of  toes 2 and 3 on the right foot if the wounds do not heal and deteriorate. Transmetatarsal amputation on the left side has as good a blood supply as it is going to get and hopefully this will heal in the future. Formal ABIs were done in January 2023. They are normal bilaterally. ABI Findings: +---------+------------------+-----+----------+--------+ Right Rt Pressure (mmHg)IndexWaveform Comment  +---------+------------------+-----+----------+--------+ Brachial 160     +---------+------------------+-----+----------+--------+ PTA 192 1.06 monophasic  +---------+------------------+-----+----------+--------+ DP 159 0.88 monophasic  +---------+------------------+-----+----------+--------+ Great T oe145 0.80    +---------+------------------+-----+----------+--------+ +--------+------------------+-----+---------+-------+ Left Lt Pressure (mmHg)IndexWaveform Comment +--------+------------------+-----+---------+-------+ [id number]     +--------+------------------+-----+---------+-------+ PTA 204 1.13 triphasic  +--------+------------------+-----+---------+-------+ DP 194 1.07 biphasic   +--------+------------------+-----+---------+-------+ +-------+-----------+-----------+------------+------------+ ABI/TBIT oday's ABIT oday's TBIPrevious ABIPrevious TBI +-------+-----------+-----------+------------+------------+ Right 1.06 0.8 1.26 0.65  +-------+-----------+-----------+------------+------------+ Left 1.13 amputation 1.15 amputation  +-------+-----------+-----------+------------+------------+ Previous ABI on 08/06/20 at [facility] Pedal pressures falsely elevated due to medial calcification. Summary: Right: Resting right ankle-brachial index is within normal range. The right toe-brachial index is normal. Left: Resting left ankle-brachial index is within normal range. READMISSION 08/04/2021 [name], [name] ([id number])  [id number] Page 2 of 12 [name] is now a 67 year old man who I remember from this clinic many years ago I think he had a right lower extremity predominantly venous wound at the time. He was here for 1 visit in March of this year had wounds on his right second and third toes we apparently dressed them many and they healed so he did not come back. He is listed in [place] is being a diabetic although the patient denies this says he is verified it with his primary doctor. In any case over the last several weeks or so according the patient although these wounds look somewhat more chronic than that he has  developed predominantly large wounds on the right medial and right lateral ankle smaller areas on the left leg and areas on the dorsal aspect of his right second and third toes. Its not clear how he has been dressing these. 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 The patient has known PAD. He had an angiogram in August 2022 at which time he had nonhealing wounds in both feet. On the left his major vessels in the thigh were all patent. He had three-vessel patent to the level of the ankle. He had a very short occlusion in the left anterior tibial. On the right lower extremity the major vessels in his thigh were all patent. He had three-vessel runoff to the foot sluggish filling of the anterior tibial artery. He was felt to have some component of small vessel disease but nothing that was amenable or needed revascularization. It was recommended that he have amputation of the second and third toes on the right foot if they did not heal He has been following with [name] of podiatry. [name] got him juxta lite stockings although I do not think he had them on properly he has uncontrolled edema in both legs Past medical history includes type 2 diabetes [although the patient really denies this],  left TMA in 2022,lower extremity wounds in fact attendance at this clinic in 2009-2010, A-fib on Eliquis, chronic venous insufficiency with secondary lymphedema history of non-Hodgkin's lymphoma. 08-11-2021 upon evaluation today patient presents for follow-up evaluation he was seen last Wednesday initially for inspection here in our clinic. With that being said he tells me that he unfortunately has been having a lot of drainage and is actually coming through his wrap. Fortunately I do not see any evidence of active infection locally or systemically at this time which is great news. No fevers, chills, nausea, vomiting, or diarrhea. With that being said there does appear to be some evidence of local infection based on what I am seeing today. 08-18-2021 upon evaluation today patient appears to be doing okay currently in regard to his wounds with that being said that he is doing much better but still has a long ways to go to get to where he wanted to be. I think the infection is significantly improved. He has another week of the antibiotic at this point. 08-25-2021 upon evaluation today patient's wounds are actually doing decently well he has erythema has significantly improved. I think the cellulitis is under controlled I am going to continue him on 2 more weeks of the Levaquin at 500 mg this is a lower dose but I am hoping it will be better for him. 09-08-2021 upon evaluation today patient appears to be doing excellent in regard to his wounds. Since I last saw him he was actually in the hospital where he ended up having a pacemaker put in. Subsequently he tells me that he is actually doing quite well although they were unsure whether they were going to do it due to the fact that he had the wounds on his legs. And then I am glad they did anything seem to be doing well. 09-15-2021 upon evaluation patient's wounds are actually showing signs of improvement. The right side wounds do appear to have some need for  sharp debridement today and I Ernie Hew go ahead and proceed with that. I think that if we get the wounds cleaned up he will actually show signs of continued  improvement. I am also leaning towards switching to [facility] which I think will be a much better option for him. 09-22-2021 patient's wounds are showing signs of excellent improvement. I am actually extremely pleased with where we stand and I think that the patient is making great progress. There does not appear to be any signs of active infection. 09-29-2021 upon evaluation today patient appears to be doing excellent in regard to his wounds. He is actually tolerating the dressing changes without complication. Fortunately I see no evidence of active infection locally or systemically at this time which is great news and overall I am extremely pleased with where we stand currently. [id number] upon evaluation today patient actually appears to be doing excellent in regard to his wounds. The left leg is almost completely healed the right ankle is significantly smaller. Overall I am extremely pleased with where we stand at this point. I do not see any evidence of active infection at this time. 10-13-2021 upon evaluation today patient appears to be doing excellent in regard to his wounds. I really feel like he is making good progress here and I am very pleased in that regard. Fortunately I do not see any signs of active infection at this time. We are using the [facility] topical antibiotic therapy. 10-20-2021 upon evaluation today patient appears to be doing well with regard to his wound on the right medial ankle region the left leg is almost completely healed. I am actually very pleased with where we stand today. 11-03-2021 upon evaluation today patient's wound actually is going require some sharp debridement but appears to be doing much better which is great news. Fortunately I do not see any signs of active infection at this time. 11-10-2021 upon evaluation  today patient's wound is actually showing signs of excellent improvement. Fortunately I do not see any evidence of infection locally or systemically which is great news and overall I am extremely pleased with where we stand today. I do believe he is making good progress he does have his [facility] topical antibiotics with him here today. 11-24-2021 upon evaluation today patient actually showing signs of excellent improvement this appears to be doing much better. Fortunately I do not see any evidence of infection locally or systemically at this time. 12-01-2021 upon evaluation today patient appears to be doing well currently in regard to his wound. He has been tolerating the dressing changes without complication. Fortunately I do not see any evidence of active infection at this time which is great news and overall I am extremely pleased with where we stand today. 12-15-2021 upon evaluation today patient appears to be doing well with regard to his wound. He is showing signs of improvement is slow but nonetheless we are making improvements here. Fortunately I do not see any evidence of active infection locally nor systemically at this time. We are still using the [facility] topical antibiotic over the open area only. 12-29-2021 upon evaluation today patient's wound actually is showing signs of excellent improvement. It has been 2 weeks since I perform any debridement and it definitely shows he has some tissue that needs to be cleaned away but I think we can do so quite easily and readily today. The good news is I do not see any signs of infection I think he is doing much better in that regard. Overall I am extremely pleased with where we stand. 01-12-2022 upon evaluation today patient appears to be doing well currently in regard to his wound although it is not  getting significantly smaller it is also not getting any larger. Fortunately I do not see any evidence of infection locally nor systemically which is  great news and overall I am extremely pleased with where things stand currently. 01-19-2022 upon evaluation today patient appears to be doing well currently in regard to his wound. The PolyMem actually seems to have done extremely well for him. Fortunately I do not see any signs of infection locally nor systemically at this time. 01-26-2022 upon evaluation today patient appears to be making progress. Fortunately there does not appear to be any signs of infection which is great news. No fevers, chills, nausea, vomiting, or diarrhea. With that being said this is very slow to heal and although it is smaller I still feel like we may want to try to do something to speed this up I think that a skin substitute could be beneficial, look into Kerecis. 02-02-2022 upon evaluation today patient's wound is actually showing signs of excellent improvement. I do not see any evidence of infection and overall I think that we are headed in the right direction. Fortunately I think that he is tolerating the dressing changes without complication. 02-09-2022 upon evaluation today patient appears to be doing well currently in regard to his wound. It does look a little bit macerated but fortunately does not appear to be showing any signs of significant skin breakdown over the macerated area which is good news. Fortunately I do not see any evidence of active [name], [name] ([id number]) [id number] Page 3 of 12 infection locally nor systemically at this point which is great news. 02-23-2022 upon evaluation today patient appears to be doing well currently in regard to his wounds. In fact his area on the ankle is actually showing signs of healing quite nicely. We do have the Apligraf ready today I am hopeful this is going to speed things up and get this closed much more effectively and quickly. Fortunately I do not see any signs of active infection locally nor systemically at this time which is great  news. 03-02-2022 upon evaluation today patient appears to be doing excellent in regard to his wound. He is actually been tolerating the dressing changes without complication the wound on the left medial lower extremity is doing quite well and Apligraf seems to have been extremely beneficial for him. I am extremely pleased with where we stand today. 03/16/2022: The wound measurements are smaller today. He has accumulation of a yellow crust around the edges and slough on the surface. There is a musty odor coming from the wound. He has been getting Apligraf. 03-30-2022 upon evaluation today patient appears to be making progress. In regard to his leg ulcer. This is slow but nonetheless the Apligraf has fed things up. Fortunately I do not see any evidence of infection locally nor systemically at this time. No fevers, chills, nausea, vomiting, or diarrhea. Patient is here for Apligraf #4 today. 04-13-2022 upon evaluation today patient appears to be doing a little bit more poorly in regard to his wound. He actually feels like his wrap may have been a little bit tight. Fortunately there does not appear to be any signs of active infection locally nor systemically which is great news and I am pleased in that regard. 04-20-2022 upon evaluation today patient appears to be doing better in regard to his wound from the standpoint of swelling he is actually lost 33 pounds on torsemide in the past week his leg is significantly smaller compared to what it has  been. Fortunately I do not see any signs of active infection locally nor systemically at this time. 04-27-2022 upon evaluation today patient's wound actually is showing signs of erythema and warmth around the edges of the wound I am actually concerned about the possibility of infection. I think we probably need to obtain a wound culture and also can recommend based on what we are seeing that we go ahead and have the patient continue to utilize the compression wrapping  which I think has been of benefit. 05-04-2022 upon evaluation today patient appears to be doing well currently in regard to his wound. He has been tolerating the dressing changes without complication. Fortunately there does not appear to be any signs of active infection locally or systemically which is great news. 05-11-2022 upon evaluation today patient actually appears to be doing excellent. He has been tolerating the dressing changes we actually were trialing a new medication on him last week and this was the ergo clean Ag dressing. With that being said this actually seems to have done extremely well at this point. Fortunately I do not see any signs of active infection locally or systemically at this time. 05-18-2022 upon evaluation today patient appears to be doing okay in regard to his wound I do not see any signs of worsening also notes any signs of significant improvement. Fortunately I do believe that removing in the right direction in general. 05-25-22 upon evaluation today patient appears to be doing well currently in regard to his wound. He has been tolerating the dressing changes without complication. Fortunately I do not see any signs of active infection locally nor systemically which is great news and overall I am extremely pleased with where we stand currently. There does not appear to be any signs of active infection locally nor systemically which is great news. No fevers, chills, nausea, vomiting, or diarrhea. 06-01-2022 upon evaluation today patient actually appears to be making excellent progress in regard to his ankle region. I am very pleased with where we stand I think that he is doing extremely well and very pleased with endoform and drawtex combination. 06-08-2022 upon evaluation today patient appears to be doing well currently in regard to his wound. He has been making good progress and I feel like practicing improvement in the overall size of the wound. I am very pleased in that  regard. Fortunately I do not see any signs of active infection locally nor systemically which is great news. No fevers, chills, nausea, vomiting, or diarrhea. 06-15-2022 upon evaluation today patient appears to be doing well currently in regard to his wound. He has been tolerating the dressing changes without complication. I do believe the endoform has been extremely helpful for him up to this point. I do not see any signs of active infection locally nor systemically which is great news and in general I do believe that we are making pretty good progress here which is excellent news. 06-22-2022 upon evaluation today patient appears to be doing well currently in regard to his wound. He has been tolerating the dressing changes without complication. Fortunately there does not appear to be any signs of active infection at this time which is great news. I think that he is actually taking good progress with the endoform which is excellent as well. 06-29-2022 upon evaluation patient appears to be doing well currently in regard to his wound. He is actually making good progress towards closure and I am very pleased in that regard. I do not see any  evidence of active infection locally nor systemically which is great news and in general I do believe that we are making excellent progress here overall. 07-06-2022 upon evaluation today patient appears to be doing well currently in regard to his wound. He has been tolerating the dressing changes without complication. The good news is he is slowly getting smaller each time I see him. 07-13-2022 upon evaluation today patient appears to be doing well currently in regard to his wound is slowly showing signs of improvement. Fortunately I do not see any signs of infection at this time. No fevers, chills, nausea, vomiting, or diarrhea. 07-20-2022 upon evaluation today patient appears to be doing well currently in regard to his wound. He has been tolerating the dressing changes  without complication. Fortunately I do not see any evidence of active infection locally or systemically which is great news and in general I do believe that we are making headway towards complete closure. This is excellent news. With that being said we are preparing for him to be leaving to go out of town in about a week and a half. For that reason we will going to make a few changes to his dressing today in order to give him something that I think will be easier for him to do when he is out of town. 07-27-2022 upon evaluation today patient appears to be doing well currently in regard to his wound. He has been tolerating the dressing changes without complication. Fortunately there does not appear to be any signs of infection. Fortunately there does not appear to be any signs of infection 08-10-2022 upon evaluation today patient appears to be doing okay in regard to his wound. He actually has been gone for 2 weeks and in the interim unfortunately he did not have result out of the Tegaderm as well as hoping for but nonetheless took good care of his wound. Does not appear to be infected I do think we need to go back to the compression wrap however which I think would definitely benefit him. Fortunately I do not see any signs of active infection at this time which is great news. 08-17-2022 upon evaluation today patient unfortunately appears to be showing signs of infection based on what I see at this time. Fortunately I do not see any evidence of systemic infection though locally I do definitely see some changes that are concerning. I do not think that this is systemic at all. 08-24-2022 upon evaluation today patient appears to be doing well currently in regard to his wound. He has been tolerating the dressing changes this looks significantly improved compared to last week and very pleased. I do not see any signs of active infection locally or systemically at this time. 08-31-2022 upon evaluation today patient  appears to be doing well currently in regard to his wound this is actually showing signs of improvement and actually very pleased with where we stand today. Fortunately I do not see any signs of active infection which is great news. 09-14-2022 upon evaluation today patient appears to be doing well current regard to his wound with section showing signs of improvement would be using [name], [name] ([id number]) [id number] Page 4 of 12 endoform which I think is really doing a great job here. Fortunately I do not see any signs of worsening overall. 09-21-2022 upon evaluation today patient appears to be doing well currently in regard to his wound. He has been tolerating the dressing changes without complication. Fortunately there does not appear to  be any signs of active infection at this time which is good news. 09-28-2022 upon evaluation today patient appears to be doing well currently in regard to his wound which is actually measuring quite a bit smaller. This looks to be doing well and very pleased with where things stand today I do not see any signs of active infection locally or systemically which is great news. No fevers, chills, nausea, vomiting, or diarrhea. 10-05-2022 upon evaluation today patient appears to be doing excellent in regard to his wounds. He has been tolerating the dressing changes without complication and in general I do believe that we will make an really good headway towards complete closure. 10/12/2022 upon evaluation today patient's wound is actually showing signs of excellent improvement. I am actually very pleased with where we stand I think that he is moving in the right direction and this is excellent news. I do not see any evidence of worsening overall 10-19-2022 upon evaluation today patient appears to be doing well currently in regard to his wound. He is showing signs of improvement and very pleased with where we stand I think that is making good  progress here. 10-26-2022 upon evaluation today patient appears to be doing well currently in regard to his wound. In fact this is showing signs of excellent improvement I am actually very pleased with where we stand I do believe that he is making excellent headway towards complete closure which is great news as well. No fevers, chills, nausea, vomiting, or diarrhea. 11-02-2022 upon evaluation today patient appears to be doing great in regard to his wound although he does have an area at the top of the wrap which sometimes gets moist which to be honest he actually appears to have a little bit of a fungal infection at this point. I think that we may need to treat this he can get to it and put some cream in there he actually does that on his own UA with some CeraVe. 11-09-2022 upon evaluation today patient's wound bed actually showed signs of good granulation epithelization at this point. Fortunately I do not see any signs of worsening overall and I do believe that the patient is making good headway towards closure. Patient's wound unfortunately at the top of the wrap appears to be still causing him problems despite the cream that we have been using over the past week. We are going to try an oral antibiotic at this time. 11/20; patient has a longstanding wound on the right medial ankle. He has very healthy looking granulation today and the wound is measuring smaller. We have been using gent and endoform. There is seems little reason to change this 12/18; longstanding wound on the right medial ankle. Wound is measuring smaller healthy granulation we have been using endoform as the primary dressing. Under illumination today probably 70 to 80% epithelialization Electronic Signature(s) Signed: 12/29/2022 12:11:14 PM By: Baltazar Najjar MD Entered By: Baltazar Najjar on 12/28/2022 15:39:08 -------------------------------------------------------------------------------- Physical Exam Details Patient Name:  Date of Service: Macgregor, [name] 12/28/2022 2:45 PM Medical Record Number: [id number] Patient Account Number: [id number] Date of Birth/Sex: Treating RN: [birth year] (66 y.o. M) Primary Care Provider: Martha [name] Other Clinician: Referring Provider: Treating Provider/Extender: [name] in Treatment: 73 Constitutional Sitting or standing Blood Pressure is within target range for patient.. Pulse regular and within target range for patient.[name] Kitchen Respirations regular, non-labored and within target range.. Temperature is normal and within the target range for the patient.[name] Kitchen Appears in no distress.  Notes Wound exam; the wound bed here continues to look much improved. Under illumination most of this is epithelialized. No debridement is required. No evidence of surrounding infection his edema control is good Psychologist, [facility] probation services) Signed: 12/29/2022 12:11:14 PM By: Baltazar Najjar MD Entered By: Baltazar Najjar on 12/28/2022 15:40:37 -------------------------------------------------------------------------------- Physician Orders Details Patient Name: Date of Service: [name], [name] 12/28/2022 2:45 PM Ewbank, Dannielle Huh [name] ([id number]) [id number] Page 5 of 12 Medical Record Number: [id number] Patient Account Number: [id number] Date of Birth/Sex: Treating RN: [birth year] (67 y.o. Harlon Flor, Millard.Loa Primary Care Provider: Martha [name] Other Clinician: Referring Provider: Treating Provider/Extender: [name] in Treatment: 60 The following information was scribed by: Shawn Stall The information was scribed for: Baltazar Najjar Verbal / Phone Orders: No Diagnosis Coding ICD-10 Coding Code Description I89.0 Lymphedema, not elsewhere classified I87.333 Chronic venous hypertension (idiopathic) with ulcer and inflammation of bilateral lower extremity L97.828 Non-pressure chronic ulcer of other part of left lower leg with other  specified severity L97.818 Non-pressure chronic ulcer of other part of right lower leg with other specified severity E11.621 Type 2 diabetes mellitus with foot ulcer L97.518 Non-pressure chronic ulcer of other part of right foot with other specified severity Follow-up Appointments Return appointment in 3 weeks. - ***[facility] Wednesday**** 01/18/2023 (front office to schedule) Nurse Visit: - x1 week 01/05/2023 315pm x2 week 01/11/2022 315pm Anesthetic (In clinic) Topical Lidocaine 4% applied to wound bed Cellular or Tissue Based Products Cellular or Tissue Based Product Type: - Run IVR for Kerrecis- denied #1 Apligraf applied 02/23/2022 #2 Apligraf applied 03/02/2022 #3 Apligraf applied 03/16/2022 #4 Apligraf applied 03/30/2022 HOLD APLIGRAF this week. Cellular or Tissue Based Product applied to wound bed, secured with steri-strips, cover with Adaptic or Mepitel. (DO NOT REMOVE). Bathing/ Shower/ Hygiene May shower with protection but do not get wound dressing(s) wet. Protect dressing(s) with water repellant cover (for example, large plastic bag) or a cast cover and may then take shower. Edema Control - Orders / Instructions Elevate legs to the level of the heart or above for 30 minutes daily and/or when sitting for 3-4 times a day throughout the day. Avoid standing for long periods of time. Patient to wear own compression stockings every day. - left leg Exercise regularly Moisturize legs daily. Compression stocking or Garment 30-40 mm/Hg pressure to: Non Wound Condition Right Lower Extremity pply the following to affected area as directed: - ketoconazole A Wound Treatment Wound #7 - Malleolus Wound Laterality: Right, Medial Cleanser: Soap and Water 1 x Per Week/30 Days Discharge Instructions: May shower and wash wound with dial antibacterial soap and water prior to dressing change. Cleanser: Vashe 5.8 (oz) 1 x Per Week/30 Days Discharge Instructions: Cleanse the wound with Vashe prior to  applying a clean dressing using gauze sponges, not tissue or cotton balls. Peri-Wound Care: Sween Lotion (Moisturizing lotion) 1 x Per Week/30 Days Discharge Instructions: Apply moisturizing lotion as directed Topical: Gentamicin 1 x Per Week/30 Days Discharge Instructions: As directed by physician Prim Dressing: Endoform 2x2 in (Dispense As Written) 1 x Per Week/30 Days ary Discharge Instructions: Moisten with saline Secondary Dressing: Zetuvit Plus 4x8 in 1 x Per Week/30 Days Discharge Instructions: Apply over primary dressing as directed. Compression Wrap: Urgo K2, (equivalent to a 4 layer) two layer compression system, regular 1 x Per Week/30 Days Discharge Instructions: Apply Urgo K2 as directed (alternative to 4 layer compression). [name], [name] ([id number]) [id number] Page 6 of 12 Electronic Signature(s) Signed: 12/28/2022 5:35:56 PM By: Elesa Hacker,  Yvonne Kendall RN, BSN Signed: 12/29/2022 12:11:14 PM By: Baltazar Najjar MD Entered By: Shawn Stall on 12/28/2022 15:39:25 -------------------------------------------------------------------------------- Problem List Details Patient Name: Date of Service: [name], [name] 12/28/2022 2:45 PM Medical Record Number: [id number] Patient Account Number: [id number] Date of Birth/Sex: Treating RN: 01/01/1956 (67 y.o. Harlon Flor, Millard.Loa Primary Care Provider: Martha [name] Other Clinician: Referring Provider: Treating Provider/Extender: [name] in Treatment: 73 Active Problems ICD-10 Encounter Code Description Active Date MDM Diagnosis I89.0 Lymphedema, not elsewhere classified 08/04/2021 No Yes I87.333 Chronic venous hypertension (idiopathic) with ulcer and inflammation of 08/04/2021 No Yes bilateral lower extremity L97.828 Non-pressure chronic ulcer of other part of left lower leg with other specified 08/04/2021 No Yes severity L97.818 Non-pressure chronic ulcer of other part of right lower leg  with other specified 08/04/2021 No Yes severity E11.621 Type 2 diabetes mellitus with foot ulcer 08/04/2021 No Yes L97.518 Non-pressure chronic ulcer of other part of right foot with other specified 08/04/2021 No Yes severity Inactive Problems Resolved Problems Electronic Signature(s) Signed: 12/29/2022 12:11:14 PM By: Baltazar Najjar MD Entered By: Baltazar Najjar on 12/28/2022 15:38:07 Progress Note Details -------------------------------------------------------------------------------- [name] ([id number]) [id number] Page 7 of 12 Patient Name: Date of Service: [name], [name] 12/28/2022 2:45 PM Medical Record Number: [id number] Patient Account Number: [id number] Date of Birth/Sex: Treating RN: 1955/10/15 (68 y.o. M) Primary Care Provider: Martha [name] Other Clinician: Referring Provider: Treating Provider/Extender: [name] in Treatment: 73 Subjective History of Present Illness (HPI) ADMISSION 03/22/2021 This is a 67 year old man with a past medical history significant for diabetes type 2, congestive heart failure, peripheral arterial disease, morbid obesity, venous insufficiency, and coronary artery disease. He has been followed by [name] in podiatry, who performed a transmetatarsal amputation on the left foot in August 2022. He had issues healing that wound, but based upon [name] notes, ultimately the TMA wound healed. During his recovery from that surgery, however, ulcers opened up over the DIP joint of the right second and third toe. These have apparently closed and reopened multiple times. It sounds like one of the issues has been moisture accumulation and maceration of the tissues causing them to reopen. At his last visit with [name], on March 01, 2021, there continues to be problems with moisture and he was referred to wound care for further evaluation and management. He had a formal aortogram with  runoff performed prior to his TMA. The findings are copied here: Patient has inline flow to both feet with no significant flow-limiting lesion that would be amenable to percutaneous or open revascularization. He does have an element of small vessel disease and has a short segment occlusion of the distal anterior tibial/dorsalis pedis artery on the left foot but does have posterior tibial artery flow. Would recommend management of wounds with amputation of toes 2 and 3 on the right foot if the wounds do not heal and deteriorate. Transmetatarsal amputation on the left side has as good a blood supply as it is going to get and hopefully this will heal in the future. Formal ABIs were done in January 2023. They are normal bilaterally. ABI Findings: +---------+------------------+-----+----------+--------+ Right Rt Pressure (mmHg)IndexWaveform Comment  +---------+------------------+-----+----------+--------+ Brachial 160    +---------+------------------+-----+----------+--------+ PTA 192 1.06 monophasic  +---------+------------------+-----+----------+--------+ DP 159 0.88 monophasic  +---------+------------------+-----+----------+--------+ Great T oe145 0.80    +---------+------------------+-----+----------+--------+ +--------+------------------+-----+---------+-------+ Left Lt Pressure (mmHg)IndexWaveform Comment +--------+------------------+-----+---------+-------+ [id number]    +--------+------------------+-----+---------+-------+ PTA 204 1.13 triphasic  +--------+------------------+-----+---------+-------+ DP 194 1.07 biphasic   +--------+------------------+-----+---------+-------+ +-------+-----------+-----------+------------+------------+  ABI/TBIT oday's ABIT oday's TBIPrevious ABIPrevious TBI +-------+-----------+-----------+------------+------------+ Right 1.06 0.8 1.26 0.65   +-------+-----------+-----------+------------+------------+ Left 1.13 amputation 1.15 amputation  +-------+-----------+-----------+------------+------------+ Previous ABI on 08/06/20 at [facility] Pedal pressures falsely elevated due to medial calcification. Summary: Right: Resting right ankle-brachial index is within normal range. The right toe-brachial index is normal. Left: Resting left ankle-brachial index is within normal range. READMISSION 08/04/2021 Mr. Rittenour is now a 67 year old man who I remember from this clinic many years ago I think he had a right lower extremity predominantly venous wound at the time. He was here for 1 visit in March of this year had wounds on his right second and third toes we apparently dressed them many and they healed so he did not come back. He is listed in [facility] is being a diabetic although the patient denies this says he is verified it with his primary doctor. In any case over the last several weeks or so according the patient although these wounds look somewhat more chronic than that he has developed predominantly large wounds on the right medial and right lateral ankle smaller areas on the left leg and areas on the dorsal aspect of his right second and third toes. Its not clear how he has been dressing these. 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 The patient has known PAD. He had an angiogram in August 2022 at which time he had nonhealing wounds in both feet. On the left his major vessels in the thigh were all patent. He had three-vessel patent to the level of the ankle. He had a very short occlusion in the left anterior tibial. On the right lower extremity the major vessels in his thigh were all patent. He had three-vessel runoff to the foot sluggish filling of the anterior tibial artery. He was felt to have some component of small vessel disease but nothing that  was amenable or needed revascularization. It was recommended that he have amputation of the second and third toes on the right foot if they did not heal He has been following with [name] of podiatry. [name] got him juxta lite stockings although I do not think he had them on properly he has uncontrolled edema in both legs [name], [name] ([id number]) [id number].pdf Page 8 of 12 Past medical history includes type 2 diabetes [although the patient really denies this], left TMA in 2022,lower extremity wounds in fact attendance at this clinic in 2009-2010, A-fib on Eliquis, chronic venous insufficiency with secondary lymphedema history of non-Hodgkin's lymphoma. 08-11-2021 upon evaluation today patient presents for follow-up evaluation he was seen last Wednesday initially for inspection here in our clinic. With that being said he tells me that he unfortunately has been having a lot of drainage and is actually coming through his wrap. Fortunately I do not see any evidence of active infection locally or systemically at this time which is great news. No fevers, chills, nausea, vomiting, or diarrhea. With that being said there does appear to be some evidence of local infection based on what I am seeing today. 08-18-2021 upon evaluation today patient appears to be doing okay currently in regard to his wounds with that being said that he is doing much better but still has a long ways to go to get to where he wanted to be. I think the infection is significantly improved. He has another week of the antibiotic at this point. 08-25-2021 upon evaluation  today patient's wounds are actually doing decently well he has erythema has significantly improved. I think the cellulitis is under controlled I am going to continue him on 2 more weeks of the Levaquin at 500 mg this is a lower dose but I am hoping it will be better for him. 09-08-2021 upon evaluation today patient appears to be doing excellent  in regard to his wounds. Since I last saw him he was actually in the hospital where he ended up having a pacemaker put in. Subsequently he tells me that he is actually doing quite well although they were unsure whether they were going to do it due to the fact that he had the wounds on his legs. And then I am glad they did anything seem to be doing well. 09-15-2021 upon evaluation patient's wounds are actually showing signs of improvement. The right side wounds do appear to have some need for sharp debridement today and I Ernie Hew go ahead and proceed with that. I think that if we get the wounds cleaned up he will actually show signs of continued improvement. I am also leaning towards switching to [facility] which I think will be a much better option for him. 09-22-2021 patient's wounds are showing signs of excellent improvement. I am actually extremely pleased with where we stand and I think that the patient is making great progress. There does not appear to be any signs of active infection. 09-29-2021 upon evaluation today patient appears to be doing excellent in regard to his wounds. He is actually tolerating the dressing changes without complication. Fortunately I see no evidence of active infection locally or systemically at this time which is great news and overall I am extremely pleased with where we stand currently. [id number] upon evaluation today patient actually appears to be doing excellent in regard to his wounds. The left leg is almost completely healed the right ankle is significantly smaller. Overall I am extremely pleased with where we stand at this point. I do not see any evidence of active infection at this time. 10-13-2021 upon evaluation today patient appears to be doing excellent in regard to his wounds. I really feel like he is making good progress here and I am very pleased in that regard. Fortunately I do not see any signs of active infection at this time. We are using the [facility]  topical antibiotic therapy. 10-20-2021 upon evaluation today patient appears to be doing well with regard to his wound on the right medial ankle region the left leg is almost completely healed. I am actually very pleased with where we stand today. 11-03-2021 upon evaluation today patient's wound actually is going require some sharp debridement but appears to be doing much better which is great news. Fortunately I do not see any signs of active infection at this time. 11-10-2021 upon evaluation today patient's wound is actually showing signs of excellent improvement. Fortunately I do not see any evidence of infection locally or systemically which is great news and overall I am extremely pleased with where we stand today. I do believe he is making good progress he does have his [facility] topical antibiotics with him here today. 11-24-2021 upon evaluation today patient actually showing signs of excellent improvement this appears to be doing much better. Fortunately I do not see any evidence of infection locally or systemically at this time. 12-01-2021 upon evaluation today patient appears to be doing well currently in regard to his wound. He has been tolerating the dressing changes without complication. Fortunately  I do not see any evidence of active infection at this time which is great news and overall I am extremely pleased with where we stand today. 12-15-2021 upon evaluation today patient appears to be doing well with regard to his wound. He is showing signs of improvement is slow but nonetheless we are making improvements here. Fortunately I do not see any evidence of active infection locally nor systemically at this time. We are still using the [facility] topical antibiotic over the open area only. 12-29-2021 upon evaluation today patient's wound actually is showing signs of excellent improvement. It has been 2 weeks since I perform any debridement and it definitely shows he has some tissue that  needs to be cleaned away but I think we can do so quite easily and readily today. The good news is I do not see any signs of infection I think he is doing much better in that regard. Overall I am extremely pleased with where we stand. 01-12-2022 upon evaluation today patient appears to be doing well currently in regard to his wound although it is not getting significantly smaller it is also not getting any larger. Fortunately I do not see any evidence of infection locally nor systemically which is great news and overall I am extremely pleased with where things stand currently. 01-19-2022 upon evaluation today patient appears to be doing well currently in regard to his wound. The PolyMem actually seems to have done extremely well for him. Fortunately I do not see any signs of infection locally nor systemically at this time. 01-26-2022 upon evaluation today patient appears to be making progress. Fortunately there does not appear to be any signs of infection which is great news. No fevers, chills, nausea, vomiting, or diarrhea. With that being said this is very slow to heal and although it is smaller I still feel like we may want to try to do something to speed this up I think that a skin substitute could be beneficial, look into Kerecis. 02-02-2022 upon evaluation today patient's wound is actually showing signs of excellent improvement. I do not see any evidence of infection and overall I think that we are headed in the right direction. Fortunately I think that he is tolerating the dressing changes without complication. 02-09-2022 upon evaluation today patient appears to be doing well currently in regard to his wound. It does look a little bit macerated but fortunately does not appear to be showing any signs of significant skin breakdown over the macerated area which is good news. Fortunately I do not see any evidence of active infection locally nor systemically at this point which is great news. 02-23-2022  upon evaluation today patient appears to be doing well currently in regard to his wounds. In fact his area on the ankle is actually showing signs of healing quite nicely. We do have the Apligraf ready today I am hopeful this is going to speed things up and get this closed much more effectively and quickly. Fortunately I do not see any signs of active infection locally nor systemically at this time which is great news. 03-02-2022 upon evaluation today patient appears to be doing excellent in regard to his wound. He is actually been tolerating the dressing changes without complication the wound on the left medial lower extremity is doing quite well and Apligraf seems to have been extremely beneficial for him. I am extremely pleased with where we stand today. 03/16/2022: The wound measurements are smaller today. He has accumulation of a yellow crust around  the edges and slough on the surface. There is a musty odor coming from the wound. He has been getting Apligraf. 03-30-2022 upon evaluation today patient appears to be making progress. In regard to his leg ulcer. This is slow but nonetheless the Apligraf has fed things up. Fortunately I do not see any evidence of infection locally nor systemically at this time. No fevers, chills, nausea, vomiting, or diarrhea. Patient is here for [name], [name] ([id number]) [id number] Page 9 of 12 Apligraf #4 today. 04-13-2022 upon evaluation today patient appears to be doing a little bit more poorly in regard to his wound. He actually feels like his wrap may have been a little bit tight. Fortunately there does not appear to be any signs of active infection locally nor systemically which is great news and I am pleased in that regard. 04-20-2022 upon evaluation today patient appears to be doing better in regard to his wound from the standpoint of swelling he is actually lost 33 pounds on torsemide in the past week his leg is significantly smaller  compared to what it has been. Fortunately I do not see any signs of active infection locally nor systemically at this time. 04-27-2022 upon evaluation today patient's wound actually is showing signs of erythema and warmth around the edges of the wound I am actually concerned about the possibility of infection. I think we probably need to obtain a wound culture and also can recommend based on what we are seeing that we go ahead and have the patient continue to utilize the compression wrapping which I think has been of benefit. 05-04-2022 upon evaluation today patient appears to be doing well currently in regard to his wound. He has been tolerating the dressing changes without complication. Fortunately there does not appear to be any signs of active infection locally or systemically which is great news. 05-11-2022 upon evaluation today patient actually appears to be doing excellent. He has been tolerating the dressing changes we actually were trialing a new medication on him last week and this was the ergo clean Ag dressing. With that being said this actually seems to have done extremely well at this point. Fortunately I do not see any signs of active infection locally or systemically at this time. 05-18-2022 upon evaluation today patient appears to be doing okay in regard to his wound I do not see any signs of worsening also notes any signs of significant improvement. Fortunately I do believe that removing in the right direction in general. 05-25-22 upon evaluation today patient appears to be doing well currently in regard to his wound. He has been tolerating the dressing changes without complication. Fortunately I do not see any signs of active infection locally nor systemically which is great news and overall I am extremely pleased with where we stand currently. There does not appear to be any signs of active infection locally nor systemically which is great news. No fevers, chills, nausea, vomiting, or  diarrhea. 06-01-2022 upon evaluation today patient actually appears to be making excellent progress in regard to his ankle region. I am very pleased with where we stand I think that he is doing extremely well and very pleased with endoform and drawtex combination. 06-08-2022 upon evaluation today patient appears to be doing well currently in regard to his wound. He has been making good progress and I feel like practicing improvement in the overall size of the wound. I am very pleased in that regard. Fortunately I do not see any signs of active  infection locally nor systemically which is great news. No fevers, chills, nausea, vomiting, or diarrhea. 06-15-2022 upon evaluation today patient appears to be doing well currently in regard to his wound. He has been tolerating the dressing changes without complication. I do believe the endoform has been extremely helpful for him up to this point. I do not see any signs of active infection locally nor systemically which is great news and in general I do believe that we are making pretty good progress here which is excellent news. 06-22-2022 upon evaluation today patient appears to be doing well currently in regard to his wound. He has been tolerating the dressing changes without complication. Fortunately there does not appear to be any signs of active infection at this time which is great news. I think that he is actually taking good progress with the endoform which is excellent as well. 06-29-2022 upon evaluation patient appears to be doing well currently in regard to his wound. He is actually making good progress towards closure and I am very pleased in that regard. I do not see any evidence of active infection locally nor systemically which is great news and in general I do believe that we are making excellent progress here overall. 07-06-2022 upon evaluation today patient appears to be doing well currently in regard to his wound. He has been tolerating the  dressing changes without complication. The good news is he is slowly getting smaller each time I see him. 07-13-2022 upon evaluation today patient appears to be doing well currently in regard to his wound is slowly showing signs of improvement. Fortunately I do not see any signs of infection at this time. No fevers, chills, nausea, vomiting, or diarrhea. 07-20-2022 upon evaluation today patient appears to be doing well currently in regard to his wound. He has been tolerating the dressing changes without complication. Fortunately I do not see any evidence of active infection locally or systemically which is great news and in general I do believe that we are making headway towards complete closure. This is excellent news. With that being said we are preparing for him to be leaving to go out of town in about a week and a half. For that reason we will going to make a few changes to his dressing today in order to give him something that I think will be easier for him to do when he is out of town. 07-27-2022 upon evaluation today patient appears to be doing well currently in regard to his wound. He has been tolerating the dressing changes without complication. Fortunately there does not appear to be any signs of infection. Fortunately there does not appear to be any signs of infection 08-10-2022 upon evaluation today patient appears to be doing okay in regard to his wound. He actually has been gone for 2 weeks and in the interim unfortunately he did not have result out of the Tegaderm as well as hoping for but nonetheless took good care of his wound. Does not appear to be infected I do think we need to go back to the compression wrap however which I think would definitely benefit him. Fortunately I do not see any signs of active infection at this time which is great news. 08-17-2022 upon evaluation today patient unfortunately appears to be showing signs of infection based on what I see at this time. Fortunately I  do not see any evidence of systemic infection though locally I do definitely see some changes that are concerning. I do not think  that this is systemic at all. 08-24-2022 upon evaluation today patient appears to be doing well currently in regard to his wound. He has been tolerating the dressing changes this looks significantly improved compared to last week and very pleased. I do not see any signs of active infection locally or systemically at this time. 08-31-2022 upon evaluation today patient appears to be doing well currently in regard to his wound this is actually showing signs of improvement and actually very pleased with where we stand today. Fortunately I do not see any signs of active infection which is great news. 09-14-2022 upon evaluation today patient appears to be doing well current regard to his wound with section showing signs of improvement would be using endoform which I think is really doing a great job here. Fortunately I do not see any signs of worsening overall. 09-21-2022 upon evaluation today patient appears to be doing well currently in regard to his wound. He has been tolerating the dressing changes without complication. Fortunately there does not appear to be any signs of active infection at this time which is good news. 09-28-2022 upon evaluation today patient appears to be doing well currently in regard to his wound which is actually measuring quite a bit smaller. This looks to be doing well and very pleased with where things stand today I do not see any signs of active infection locally or systemically which is great news. No fevers, chills, nausea, vomiting, or diarrhea. 10-05-2022 upon evaluation today patient appears to be doing excellent in regard to his wounds. He has been tolerating the dressing changes without complication and in general I do believe that we will make an really good headway towards complete closure. 10/12/2022 upon evaluation today patient's wound is  actually showing signs of excellent improvement. I am actually very pleased with where we stand I think that he is moving in the right direction and this is excellent news. I do not see any evidence of worsening overall [name], [name] ([id number]) [id number] Page 10 of 12 10-19-2022 upon evaluation today patient appears to be doing well currently in regard to his wound. He is showing signs of improvement and very pleased with where we stand I think that is making good progress here. 10-26-2022 upon evaluation today patient appears to be doing well currently in regard to his wound. In fact this is showing signs of excellent improvement I am actually very pleased with where we stand I do believe that he is making excellent headway towards complete closure which is great news as well. No fevers, chills, nausea, vomiting, or diarrhea. 11-02-2022 upon evaluation today patient appears to be doing great in regard to his wound although he does have an area at the top of the wrap which sometimes gets moist which to be honest he actually appears to have a little bit of a fungal infection at this point. I think that we may need to treat this he can get to it and put some cream in there he actually does that on his own UA with some CeraVe. 11-09-2022 upon evaluation today patient's wound bed actually showed signs of good granulation epithelization at this point. Fortunately I do not see any signs of worsening overall and I do believe that the patient is making good headway towards closure. Patient's wound unfortunately at the top of the wrap appears to be still causing him problems despite the cream that we have been using over the past week. We are going to try an oral  antibiotic at this time. 11/20; patient has a longstanding wound on the right medial ankle. He has very healthy looking granulation today and the wound is measuring smaller. We have been using gent and endoform. There is  seems little reason to change this 12/18; longstanding wound on the right medial ankle. Wound is measuring smaller healthy granulation we have been using endoform as the primary dressing. Under illumination today probably 70 to 80% epithelialization Objective Constitutional Sitting or standing Blood Pressure is within target range for patient.. Pulse regular and within target range for patient.[name] Kitchen Respirations regular, non-labored and within target range.. Temperature is normal and within the target range for the patient.[name] Kitchen Appears in no distress. Vitals Time Taken: 3:10 PM, Height: 71 in, Weight: 350 lbs, BMI: 48.8, Temperature: 98.5 F, Pulse: 73 bpm, Respiratory Rate: 18 breaths/min, Blood Pressure: 148/84 mmHg. General Notes: Wound exam; the wound bed here continues to look much improved. Under illumination most of this is epithelialized. No debridement is required. No evidence of surrounding infection his edema control is good Integumentary (Hair, Skin) Wound #7 status is Open. Original cause of wound was Blister. The date acquired was: 07/21/2021. The wound has been in treatment 73 weeks. The wound is located on the Right,Medial Malleolus. The wound measures 2.1cm length x 0.4cm width x 0.1cm depth; 0.66cm^2 area and 0.066cm^3 volume. There is Fat Layer (Subcutaneous Tissue) exposed. There is no tunneling or undermining noted. There is a medium amount of serosanguineous drainage noted. There is large (67-100%) red, pink granulation within the wound bed. There is a small (1-33%) amount of necrotic tissue within the wound bed including Adherent Slough. The periwound skin appearance did not exhibit: Callus, Crepitus, Excoriation, Induration, Rash, Scarring, Dry/Scaly, Maceration, Atrophie Blanche, Cyanosis, Ecchymosis, Hemosiderin Staining, Mottled, Pallor, Rubor, Erythema. Periwound temperature was noted as No Abnormality. 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 There was a Double Layer Compression Therapy Procedure by Shawn Stall, RN. Post procedure Diagnosis Wound #7: Same as Pre-Procedure Plan Follow-up Appointments: Return appointment in 3 weeks. - ***[facility] Wednesday**** 01/18/2023 (front office to schedule) TYSEAN, FORSHEE [name] ([id number]) [phone].pdf Page 11 of 12 Nurse Visit: - x1 week 01/05/2023 315pm x2 week 01/11/2022 315pm Anesthetic: (In clinic) Topical Lidocaine 4% applied to wound bed Cellular or Tissue Based Products: Cellular or Tissue Based Product Type: - Run IVR for Kerrecis- denied #1 Apligraf applied 02/23/2022 #2 Apligraf applied 03/02/2022 #3 Apligraf applied 03/16/2022 #4 Apligraf applied 03/30/2022 HOLD APLIGRAF this week. Cellular or Tissue Based Product applied to wound bed, secured with steri-strips, cover with Adaptic or Mepitel. (DO NOT REMOVE). Bathing/ Shower/ Hygiene: May shower with protection but do not get wound dressing(s) wet. Protect dressing(s) with water repellant cover (for example, large plastic bag) or a cast cover and may then take shower. Edema Control - Orders / Instructions: Elevate legs to the level of the heart or above for 30 minutes daily and/or when sitting for 3-4 times a day throughout the day. Avoid standing for long periods of time. Patient to wear own compression  stockings every day. - left leg Exercise regularly Moisturize legs daily. Compression  stocking or Garment 30-40 mm/Hg pressure to: Non Wound Condition: Apply the following to affected area as directed: - ketoconazole WOUND #7: - Malleolus Wound Laterality: Right, Medial Cleanser: Soap and Water 1 x Per Week/30 Days Discharge Instructions: May shower and wash wound with dial antibacterial soap and water prior to dressing change. Cleanser: Vashe 5.8 (oz) 1 x Per Week/30 Days Discharge Instructions: Cleanse the wound with Vashe prior to applying a clean dressing using gauze sponges, not tissue or cotton balls. Peri-Wound Care: Sween Lotion (Moisturizing lotion) 1 x Per Week/30 Days Discharge Instructions: Apply moisturizing lotion as directed Topical: Gentamicin 1 x Per Week/30 Days Discharge Instructions: As directed by physician Prim Dressing: Endoform 2x2 in (Dispense As Written) 1 x Per Week/30 Days ary Discharge Instructions: Moisten with saline Secondary Dressing: Zetuvit Plus 4x8 in 1 x Per Week/30 Days Discharge Instructions: Apply over primary dressing as directed. Com pression Wrap: Urgo K2, (equivalent to a 4 layer) two layer compression system, regular 1 x Per Week/30 Days Discharge Instructions: Apply Urgo K2 as directed (alternative to 4 layer compression). Continue with topical gentamicin and endoform. We are making excellent progress. We are continuing with Zetuvit and Urgo K2. Electronic Signature(s) Signed: 12/29/2022 12:11:14 PM By: Baltazar Najjar MD Entered By: Baltazar Najjar on 12/28/2022 15:42:05 -------------------------------------------------------------------------------- SuperBill Details Patient Name: Date of Service: Koroma, [name] 12/28/2022 Medical Record Number: [id number] Patient Account Number: [id number] Date of Birth/Sex: Treating RN: [birth year] (67 y.o. Harlon Flor, Millard.Loa Primary Care Provider: Martha [name] Other Clinician: Referring Provider: Treating Provider/Extender: [name] in Treatment: 73 Diagnosis  Coding ICD-10 Codes Code Description I89.0 Lymphedema, not elsewhere classified I87.333 Chronic venous hypertension (idiopathic) with ulcer and inflammation of bilateral lower extremity L97.828 Non-pressure chronic ulcer of other part of left lower leg with other specified severity L97.818 Non-pressure chronic ulcer of other part of right lower leg with other specified severity E11.621 Type 2 diabetes mellitus with foot ulcer L97.518 Non-pressure chronic ulcer of other part of right foot with other specified severity Facility Procedures : 3 CPT4 Code: [id number] Description: (Facility Use Only) [phone] - APPLY MULTLAY COMPRS LWR RT LEG Modifier: Quantity: 1 Physician Procedures FINESSE, SHEAD ([id number]): CPT4 Code Description [id number] - WC PHYS LEVEL 3 - EST PT ICD-10 Diagnosis Description L97.828 Non-pressure chronic ulcer of other part of left lower leg with o I87.333 Chronic venous hypertension (idiopathic) with ulcer  and inflammat I89.0 Lymphedema, not elsewhere classified [phone].pdf Page 12 of 12: Quantity Modifier 1 ther specified severity ion of bilateral lower extremity Electronic Signature(s) Signed: 12/29/2022 12:11:14 PM By: Baltazar Najjar MD Entered By: Baltazar Najjar on 12/28/2022 15:42:38

## 2022-12-30 NOTE — Progress Notes (Signed)
[name], [name] ([id number]) [id number] Page 1 of 7 Visit Report for 12/28/2022 Arrival Information Details Patient Name: Date of Service: [name], [name] 12/28/2022 2:45 PM Medical Record Number: [id number] Patient Account Number: [id number] Date of Birth/Sex: Treating RN: 1955/11/16 (67 y.o. M) Primary Care Vash Quezada: [name] Other Clinician: Referring Menaal Russum: Treating Lounette Sloan/Extender: Mikki Santee in Treatment: 73 Visit Information History Since Last Visit Added or deleted any medications: No Patient Arrived: [name] Any new allergies or adverse reactions: No Arrival Time: 15:04 Had a fall or experienced change in No Accompanied By: self activities of daily living that may affect Transfer Assistance: None risk of falls: Patient Identification Verified: Yes Signs or symptoms of abuse/neglect since last visito No Secondary Verification Process Completed: Yes Hospitalized since last visit: No Patient Requires Transmission-Based Precautions: No Implantable device outside of the clinic excluding No Patient Has Alerts: Yes cellular tissue based products placed in the center Patient Alerts: Patient on Blood Thinner since last visit: 01/2021 ABI L 1.13 R 1.06 Has Dressing in Place as Prescribed: Yes 01/2021 TBI L amp R 0.8 Has Compression in Place as Prescribed: Yes Pain Present Now: No Electronic Signature(s) Signed: 12/28/2022 5:28:18 PM By: Thayer Dallas Entered By: Thayer Dallas on 12/28/2022 15:05:13 -------------------------------------------------------------------------------- Compression Therapy Details Patient Name: Date of Service: [name] D. 12/28/2022 2:45 PM Medical Record Number: [id number] Patient Account Number: [id number] Date of Birth/Sex: Treating RN: [birth year] (67 y.o. [name] Primary Care Tinlee Navarrette: [name] Other Clinician: Referring Marguarite Markov: Treating Liam Cammarata/Extender: Mikki Santee in Treatment: 73 Compression Therapy Performed for Wound Assessment: Wound #7 Right,Medial Malleolus Performed By: Clinician Shawn Stall, RN Compression Type: Double Layer Post Procedure Diagnosis Same as Pre-procedure Electronic Signature(s) Signed: 12/28/2022 5:35:56 PM By: Shawn Stall RN, BSN Entered By: Shawn Stall on 12/28/2022 15:35:46 Finch, Kham D ([id number]) [id number] Page 2 of 7 -------------------------------------------------------------------------------- Encounter Discharge Information Details Patient Name: Date of Service: [name], [name] 12/28/2022 2:45 PM Medical Record Number: [id number] Patient Account Number: [id number] Date of Birth/Sex: Treating RN: 08/08/1955 (67 y.o. [name] Primary Care Justyna Timoney: [name] Other Clinician: Referring Loriene Taunton: Treating Anokhi Shannon/Extender: Mikki Santee in Treatment: 38 Encounter Discharge Information Items Discharge Condition: Stable Ambulatory Status: Walker Discharge Destination: Home Transportation: Private Auto Accompanied By: self Schedule Follow-up Appointment: Yes Clinical Summary of Care: Electronic Signature(s) Signed: 12/28/2022 5:35:56 PM By: Shawn Stall RN, BSN Entered By: Shawn Stall on 12/28/2022 15:40:24 -------------------------------------------------------------------------------- Lower Extremity Assessment Details Patient Name: Date of Service: [name], DA [name] Neptune D. 12/28/2022 2:45 PM Medical Record Number: [id number] Patient Account Number: [id number] Date of Birth/Sex: Treating RN: 11-29-1955 (67 y.o. M) Primary Care Binnie Vonderhaar: [name] Other Clinician: Referring Chrisy Hillebrand: Treating Shamell Hittle/Extender: Mikki Santee in Treatment: 73 Edema Assessment Assessed: Kyra Searles: No] Franne Forts: No] Edema: [Left: N] [Right: o] Calf Left: Right: Point of Measurement: 31 cm From Medial Instep  40.5 cm Ankle Left: Right: Point of Measurement: 11 cm From Medial Instep 25.5 cm Vascular Assessment Extremity colors, hair growth, and conditions: Extremity Color: [Right:Normal] Hair Growth on Extremity: [Right:No] Temperature of Extremity: [Right:Warm] Capillary Refill: [Right:< 3 seconds] Dependent Rubor: [Right:No Yes] Electronic Signature(s) Signed: 12/28/2022 5:28:18 PM By: Thayer Dallas Entered By: Thayer Dallas on 12/28/2022 15:13:25 Breden, Waldron D ([id number]) [id number] Page 3 of 7 -------------------------------------------------------------------------------- Multi Wound Chart Details Patient Name: Date of Service: [name], [name] 12/28/2022 2:45 PM Medical Record Number: [id number] Patient Account Number: [id number] Date of Birth/Sex: Treating RN:  [birth year] (67 y.o. M) Primary Care Grafton Warzecha: [name] Other Clinician: Referring Corena Tilson: Treating Gurkaran Rahm/Extender: Mikki Santee in Treatment: 73 Vital Signs Height(in): 71 Pulse(bpm): 73 Weight(lbs): 350 Blood Pressure(mmHg): 148/84 Body Mass Index(BMI): 48.8 Temperature(F): 98.5 Respiratory Rate(breaths/min): 18 [7:Photos:] [N/A:N/A] Right, Medial Malleolus N/A N/A Wound Location: Blister N/A N/A Wounding Event: Venous Leg Ulcer N/A N/A Primary Etiology: Lymphedema N/A N/A Secondary Etiology: Sleep Apnea, Arrhythmia, Congestive N/A N/A Comorbid History: Heart Failure, Hypertension, Hypotension, Peripheral Arterial Disease, Peripheral Venous Disease, Type II Diabetes, Gout, Osteoarthritis 07/21/2021 N/A N/A Date Acquired: 3 N/A N/A Weeks of Treatment: Open N/A N/A Wound Status: No N/A N/A Wound Recurrence: 2.1x0.4x0.1 N/A N/A Measurements L x W x D (cm) 0.66 N/A N/A A (cm) : rea 0.066 N/A N/A Volume (cm) : 98.30% N/A N/A % Reduction in Area: 99.80% N/A N/A % Reduction in Volume: Full Thickness With Exposed Support N/A  N/A Classification: Structures Medium N/A N/A Exudate Amount: Serosanguineous N/A N/A Exudate Type: red, brown N/A N/A Exudate Color: Large (67-100%) N/A N/A Granulation Amount: Red, Pink N/A N/A Granulation Quality: Small (1-33%) N/A N/A Necrotic Amount: Fat Layer (Subcutaneous Tissue): Yes N/A N/A Exposed Structures: Fascia: No Tendon: No Muscle: No Joint: No Bone: No Small (1-33%) N/A N/A Epithelialization: Excoriation: No N/A N/A Periwound Skin Texture: Induration: No Callus: No Crepitus: No Rash: No Scarring: No Maceration: No N/A N/A Periwound Skin Moisture: Dry/Scaly: No Atrophie Blanche: No N/A N/A Periwound Skin Color: Cyanosis: No Ecchymosis: No Erythema: No Hemosiderin Staining: No Mottled: No Pallor: No LAMAN, RUDDEN D ([id number]) [id number] Page 4 of 7 Rubor: No No Abnormality N/A N/A Temperature: Compression Therapy N/A N/A Procedures Performed: Treatment Notes Electronic Signature(s) Signed: 12/29/2022 12:11:14 PM By: Baltazar Najjar MD Entered By: Baltazar Najjar on 12/28/2022 15:38:18 -------------------------------------------------------------------------------- Multi-Disciplinary Care Plan Details Patient Name: Date of Service: [name] D. 12/28/2022 2:45 PM Medical Record Number: [id number] Patient Account Number: [id number] Date of Birth/Sex: Treating RN: [birth year] (67 y.o. [name] Primary Care Trayce Maino: [name] Other Clinician: Referring Jisela Merlino: Treating Arria Naim/Extender: Mikki Santee in Treatment: 45 Multidisciplinary Care Plan reviewed with physician Active Inactive Pain, Acute or Chronic Nursing Diagnoses: Pain, acute or chronic: actual or potential Potential alteration in comfort, pain Goals: Patient will verbalize adequate pain control and receive pain control interventions during procedures as needed Date Initiated: 08/04/2021 Target Resolution  Date: 02/10/2023 Goal Status: Active Patient/caregiver will verbalize comfort level met Date Initiated: 08/04/2021 Date Inactivated: 07/27/2022 Target Resolution Date: 09/10/2022 Goal Status: Met Interventions: Encourage patient to take pain medications as prescribed Provide education on pain management Reposition patient for comfort Treatment Activities: Administer pain control measures as ordered : 08/04/2021 Notes: Electronic Signature(s) Signed: 12/28/2022 5:35:56 PM By: Shawn Stall RN, BSN Entered By: Shawn Stall on 12/28/2022 15:07:51 -------------------------------------------------------------------------------- Pain Assessment Details Patient Name: Date of Service: Martin, DA [name] Neptune D. 12/28/2022 2:45 PM Medical Record Number: [id number] Patient Account Number: [id number] Date of Birth/Sex: Treating RN: Jul 21, 1955 (67 y.o. M) Primary Care Kinda Pottle: [name] Other Clinician: ABDUALLAH, DOOLITTLE D ([id number]) [id number] Page 5 of 7 Referring Jonesha Tsuchiya: Treating Zavia Pullen/Extender: Mikki Santee in Treatment: 73 Active Problems Location of Pain Severity and Description of Pain Patient Has Paino No Site Locations Pain Management and Medication Current Pain Management: Electronic Signature(s) Signed: 12/28/2022 5:28:18 PM By: Thayer Dallas Entered By: Thayer Dallas on 12/28/2022 15:10:24 -------------------------------------------------------------------------------- Patient/Caregiver Education Details Patient Name: Date of Service: [name] D. 12/18/2024andnbsp2:45 PM Medical Record Number: [id number] Patient  Account Number: [id number] Date of Birth/Gender: Treating RN: [birth year] (67 y.o. [name] Primary Care Physician: [name] Other Clinician: Referring Physician: Treating Physician/Extender: Mikki Santee in Treatment: 4 Education Assessment Education Provided  To: Patient Education Topics Provided Pain: Handouts: A Guide to Pain Control Methods: Explain/Verbal Responses: Reinforcements needed Electronic Signature(s) Signed: 12/28/2022 5:35:56 PM By: Shawn Stall RN, BSN Entered By: Shawn Stall on 12/28/2022 15:08:05 Memmer, [name] ([id number]) [id number] Page 6 of 7 -------------------------------------------------------------------------------- Wound Assessment Details Patient Name: Date of Service: BRIXTEN, BLAKELY 12/28/2022 2:45 PM Medical Record Number: [id number] Patient Account Number: [id number] Date of Birth/Sex: Treating RN: June 22, 1955 (67 y.o. M) Primary Care Numa Heatwole: [name] Other Clinician: Referring Terez Montee: Treating Alexa Blish/Extender: Mikki Santee in Treatment: 73 Wound Status Wound Number: 7 Primary Venous Leg Ulcer Etiology: Wound Location: Right, Medial Malleolus Secondary Lymphedema Wounding Event: Blister Etiology: Date Acquired: 07/21/2021 Wound Open Weeks Of Treatment: 73 Status: Clustered Wound: No Comorbid Sleep Apnea, Arrhythmia, Congestive Heart Failure, Hypertension, History: Hypotension, Peripheral Arterial Disease, Peripheral Venous Disease, Type II Diabetes, Gout, Osteoarthritis Photos Wound Measurements Length: (cm) 2.1 Width: (cm) 0.4 Depth: (cm) 0.1 Area: (cm) 0.66 Volume: (cm) 0.066 % Reduction in Area: 98.3% % Reduction in Volume: 99.8% Epithelialization: Small (1-33%) Tunneling: No Undermining: No Wound Description Classification: Full Thickness With Exposed Suppo Exudate Amount: Medium Exudate Type: Serosanguineous Exudate Color: red, brown rt Structures Foul Odor After Cleansing: No Slough/Fibrino Yes Wound Bed Granulation Amount: Large (67-100%) Exposed Structure Granulation Quality: Red, Pink Fascia Exposed: No Necrotic Amount: Small (1-33%) Fat Layer (Subcutaneous Tissue) Exposed: Yes Necrotic Quality: Adherent  Slough Tendon Exposed: No Muscle Exposed: No Joint Exposed: No Bone Exposed: No Periwound Skin Texture Texture Color No Abnormalities Noted: No No Abnormalities Noted: No Callus: No Atrophie Blanche: No Crepitus: No Cyanosis: No Excoriation: No Ecchymosis: No Induration: No Erythema: No Rash: No Hemosiderin Staining: No Scarring: No Mottled: No Pallor: No Moisture Rubor: No No Abnormalities Noted: No Dry / Scaly: No Temperature / Pain Maceration: No Temperature: No Abnormality Brabec, Kevonta D ([id number]) [id number] Page 7 of 7 Treatment Notes Wound #7 (Malleolus) Wound Laterality: Right, Medial Cleanser Soap and Water Discharge Instruction: May shower and wash wound with dial antibacterial soap and water prior to dressing change. Vashe 5.8 (oz) Discharge Instruction: Cleanse the wound with Vashe prior to applying a clean dressing using gauze sponges, not tissue or cotton balls. Peri-Wound Care Sween Lotion (Moisturizing lotion) Discharge Instruction: Apply moisturizing lotion as directed Topical Gentamicin Discharge Instruction: As directed by physician Primary Dressing Endoform 2x2 in Discharge Instruction: Moisten with saline Secondary Dressing Zetuvit Plus 4x8 in Discharge Instruction: Apply over primary dressing as directed. Secured With Compression Wrap Urgo K2, (equivalent to a 4 layer) two layer compression system, regular Discharge Instruction: Apply Urgo K2 as directed (alternative to 4 layer compression). Compression Stockings Add-Ons Electronic Signature(s) Signed: 12/28/2022 5:28:18 PM By: Thayer Dallas Entered By: Thayer Dallas on 12/28/2022 15:19:18 -------------------------------------------------------------------------------- Vitals Details Patient Name: Date of Service: Dill, [name] 12/28/2022 2:45 PM Medical Record Number: [id number] Patient Account Number: [id number] Date of Birth/Sex: Treating  RN: November 26, 1955 (67 y.o. M) Primary Care Britney Newstrom: [name] Other Clinician: Referring Tima Curet: Treating Sanuel Ladnier/Extender: Mikki Santee in Treatment: 73 Vital Signs Time Taken: 15:10 Temperature (F): 98.5 Height (in): 71 Pulse (bpm): 73 Weight (lbs): 350 Respiratory Rate (breaths/min): 18 Body Mass Index (BMI): 48.8 Blood Pressure (mmHg): 148/84 Reference Range: 80 - 120 mg / dl Electronic Signature(s)  Signed: 12/28/2022 5:28:18 PM By: Thayer Dallas Entered By: Thayer Dallas on 12/28/2022 15:10:19

## 2023-01-01 DIAGNOSIS — G4733 Obstructive sleep apnea (adult) (pediatric): Secondary | ICD-10-CM | POA: Diagnosis not present

## 2023-01-05 ENCOUNTER — Encounter (HOSPITAL_BASED_OUTPATIENT_CLINIC_OR_DEPARTMENT_OTHER): Payer: Medicare Other | Admitting: General Surgery

## 2023-01-05 DIAGNOSIS — I890 Lymphedema, not elsewhere classified: Secondary | ICD-10-CM | POA: Diagnosis not present

## 2023-01-05 DIAGNOSIS — L97828 Non-pressure chronic ulcer of other part of left lower leg with other specified severity: Secondary | ICD-10-CM | POA: Diagnosis not present

## 2023-01-05 DIAGNOSIS — I2510 Atherosclerotic heart disease of native coronary artery without angina pectoris: Secondary | ICD-10-CM | POA: Diagnosis not present

## 2023-01-05 DIAGNOSIS — E1151 Type 2 diabetes mellitus with diabetic peripheral angiopathy without gangrene: Secondary | ICD-10-CM | POA: Diagnosis not present

## 2023-01-05 DIAGNOSIS — E11621 Type 2 diabetes mellitus with foot ulcer: Secondary | ICD-10-CM | POA: Diagnosis not present

## 2023-01-05 DIAGNOSIS — I87333 Chronic venous hypertension (idiopathic) with ulcer and inflammation of bilateral lower extremity: Secondary | ICD-10-CM | POA: Diagnosis not present

## 2023-01-05 DIAGNOSIS — L97518 Non-pressure chronic ulcer of other part of right foot with other specified severity: Secondary | ICD-10-CM | POA: Diagnosis not present

## 2023-01-05 DIAGNOSIS — L97818 Non-pressure chronic ulcer of other part of right lower leg with other specified severity: Secondary | ICD-10-CM | POA: Diagnosis not present

## 2023-01-05 DIAGNOSIS — Z89432 Acquired absence of left foot: Secondary | ICD-10-CM | POA: Diagnosis not present

## 2023-01-05 DIAGNOSIS — I509 Heart failure, unspecified: Secondary | ICD-10-CM | POA: Diagnosis not present

## 2023-01-05 DIAGNOSIS — I110 Hypertensive heart disease with heart failure: Secondary | ICD-10-CM | POA: Diagnosis not present

## 2023-01-05 NOTE — Progress Notes (Signed)
JADENCE, BUTTERLY ([id number]) [id number] Page 1 of 1 Visit Report for 01/05/2023 SuperBill Details Patient Name: Date of Service: BAWI, ZECH 01/05/2023 Medical Record Number: [id number] Patient Account Number: [id number] Date of Birth/Sex: Treating RN: [birth year] (67 y.o. M) Primary Care Provider: [name] Other Clinician: Thayer Dallas Referring Provider: Treating Provider/Extender: [name] in Treatment: 74 Diagnosis Coding ICD-10 Codes Code Description I89.0 Lymphedema, not elsewhere classified I87.333 Chronic venous hypertension (idiopathic) with ulcer and inflammation of bilateral lower extremity L97.828 Non-pressure chronic ulcer of other part of left lower leg with other specified severity L97.818 Non-pressure chronic ulcer of other part of right lower leg with other specified severity E11.621 Type 2 diabetes mellitus with foot ulcer L97.518 Non-pressure chronic ulcer of other part of right foot with other specified severity Facility Procedures CPT4 Code Description Modifier Quantity [id number] (Facility Use Only) [phone] - APPLY MULTLAY COMPRS LWR RT LEG 1 Electronic Signature(s) Signed: 01/05/2023 3:44:17 PM By: Duanne Guess MD FACS Signed: 01/05/2023 4:00:31 PM By: Thayer Dallas Entered By: Thayer Dallas on 01/05/2023 12:28:15

## 2023-01-05 NOTE — Progress Notes (Signed)
BOSCO, BRATHWAITE ([id number]) [id number] Page 1 of 4 Visit Report for 01/05/2023 Arrival Information Details Patient Name: Date of Service: [name], [name] 01/05/2023 3:15 PM Medical Record Number: [id number] Patient Account Number: [id number] Date of Birth/Sex: Treating RN: [birth year] (67 y.o. M) Primary Care [name]: [name] Other Clinician: Referring [name]: Treating [name]/Extender: [name] in Treatment: 74 Visit Information History Since Last Visit Added or deleted any medications: No Patient Arrived: [name] Any new allergies or adverse reactions: No Arrival Time: 14:57 Had a fall or experienced change in No Accompanied By: self activities of daily living that may affect Transfer Assistance: None risk of falls: Patient Identification Verified: Yes Signs or symptoms of abuse/neglect since last visito No Secondary Verification Process Completed: Yes Hospitalized since last visit: No Patient Requires Transmission-Based Precautions: No Implantable device outside of the clinic excluding No Patient Has Alerts: Yes cellular tissue based products placed in the center Patient Alerts: Patient on Blood Thinner since last visit: 01/2021 ABI L 1.13 R 1.06 Has Dressing in Place as Prescribed: Yes 01/2021 TBI L amp R 0.8 Has Compression in Place as Prescribed: Yes Pain Present Now: No Electronic Signature(s) Signed: 01/05/2023 4:00:31 PM By: [name] Entered By: [name] on 01/05/2023 11:58:05 -------------------------------------------------------------------------------- Compression Therapy Details Patient Name: Date of Service: [name] Craven D. 01/05/2023 3:15 PM Medical Record Number: [id number] Patient Account Number: [id number] Date of Birth/Sex: Treating RN: 14-Oct-1955 (67 y.o. M) Primary Care Edrie Ehrich: [name] Other Clinician: Referring [name]: Treating [name]/Extender: [name] in Treatment: 17 Compression Therapy Performed for Wound Assessment: Wound #7 Right,Medial Malleolus Performed By: Clinician [name], Compression Type: Double Layer Electronic Signature(s) Signed: 01/05/2023 4:00:31 PM By: [name] Entered By: [name] on 01/05/2023 12:26:38 -------------------------------------------------------------------------------- Encounter Discharge Information Details Patient Name: Date of Service: [name], [name] 01/05/2023 3:15 PM Medical Record Number: [id number] Patient Account Number: [id number] [name] ([id number]) [phone].pdf Page 2 of 4 Date of Birth/Sex: Treating RN: 26-Oct-1955 (67 y.o. M) Primary Care Zoii Florer: Other Clinician: Maximino [name] Referring Elisha Cooksey: Treating [name]/Extender: [name] in Treatment: 40 Encounter Discharge Information Items Discharge Condition: Stable Ambulatory Status: Walker Discharge Destination: Home Transportation: Private Auto Accompanied By: self Schedule Follow-up Appointment: Yes Clinical Summary of Care: Electronic Signature(s) Signed: 01/05/2023 4:00:31 PM By: [name] Entered By: [name] on 01/05/2023 12:27:52 -------------------------------------------------------------------------------- Patient/Caregiver Education Details Patient Name: Date of Service: [name] 12/26/2024andnbsp3:15 PM Medical Record Number: [id number] Patient Account Number: [id number] Date of Birth/Gender: Treating RN: Apr 26, 1955 (67 y.o. M) Primary Care Physician: [name] Other Clinician: Thayer [name] Referring Physician: Treating Physician/Extender: [name] in Treatment: 76 Education Assessment Education Provided To: Patient Education Topics Provided Electronic Signature(s) Signed: 01/05/2023 4:00:31 PM By: [name] Entered By: [name] on  01/05/2023 12:27:17 -------------------------------------------------------------------------------- Wound Assessment Details Patient Name: Date of Service: [name], [name] 01/05/2023 3:15 PM Medical Record Number: [id number] Patient Account Number: [id number] Date of Birth/Sex: Treating RN: [birth year] (67 y.o. M) Primary Care Emmilee Reamer: [name] Other Clinician: Referring [name]: Treating [name]/Extender: [name] in Treatment: 74 Wound Status Wound Number: 7 Primary Etiology: Venous Leg Ulcer Wound Location: Right, Medial Malleolus Secondary Etiology: Lymphedema Wounding Event: Blister Wound Status: Open Date Acquired: 07/21/2021 Weeks Of Treatment: 74 Clustered Wound: No Wound Measurements [name], Naeem D ([id number]) Length: (cm) 2.1 Width: (cm) 0.4 Depth: (cm) 0.1 Area: (cm) 0.66 Volume: (cm) 0.066 [id number] Page 3  of 4 % Reduction in Area: 98.3% % Reduction in Volume: 99.8% Wound Description Classification: Full Thickness With Exposed Support Exudate Amount: Medium Exudate Type: Serosanguineous Exudate Color: red, brown Structures Periwound Skin Texture Texture Color No Abnormalities Noted: No No Abnormalities Noted: No Moisture No Abnormalities Noted: No Treatment Notes Wound #7 (Malleolus) Wound Laterality: Right, Medial Cleanser Soap and Water Discharge Instruction: May shower and wash wound with dial antibacterial soap and water prior to dressing change. Vashe 5.8 (oz) Discharge Instruction: Cleanse the wound with Vashe prior to applying a clean dressing using gauze sponges, not tissue or cotton balls. Peri-Wound Care Sween Lotion (Moisturizing lotion) Discharge Instruction: Apply moisturizing lotion as directed Topical Gentamicin Discharge Instruction: As directed by physician Primary Dressing Endoform 2x2 in Discharge Instruction: Moisten with saline Secondary Dressing Zetuvit Plus 4x8  in Discharge Instruction: Apply over primary dressing as directed. Secured With Compression Wrap Urgo K2, (equivalent to a 4 layer) two layer compression system, regular Discharge Instruction: Apply Urgo K2 as directed (alternative to 4 layer compression). Compression Stockings Add-Ons Electronic Signature(s) Signed: 01/05/2023 4:00:31 PM By: [name] Entered By: [name] on 01/05/2023 11:58:25 -------------------------------------------------------------------------------- Vitals Details Patient Name: Date of Service: Blocher, [name] 01/05/2023 3:15 PM Medical Record Number: [id number] Patient Account Number: [id number] Date of Birth/Sex: Treating RN: [birth year] (67 y.o. M) Primary Care Tammie Ellsworth: [name] Other Clinician: Referring Aarian Cleaver: Treating Avyn Coate/Extender: [name] in Treatment: [address] [place] D ([id number]) [id number] Page 4 of 4 Vital Signs Time Taken: 14:58 Reference Range: 80 - 120 mg / dl Height (in): 71 Weight (lbs): 350 Body Mass Index (BMI): 48.8 Electronic Signature(s) Signed: 01/05/2023 4:00:31 PM By: [name] Entered By: [name] on 01/05/2023 11:58:14

## 2023-01-09 DIAGNOSIS — Z79899 Other long term (current) drug therapy: Secondary | ICD-10-CM | POA: Diagnosis not present

## 2023-01-10 LAB — BASIC METABOLIC PANEL
BUN/Creatinine Ratio: 15 (ref 10–24)
BUN: 20 mg/dL (ref 8–27)
CO2: 19 mmol/L — ABNORMAL LOW (ref 20–29)
Calcium: 9.2 mg/dL (ref 8.6–10.2)
Chloride: 101 mmol/L (ref 96–106)
Creatinine, Ser: 1.32 mg/dL — ABNORMAL HIGH (ref 0.76–1.27)
Glucose: 108 mg/dL — ABNORMAL HIGH (ref 70–99)
Potassium: 4.9 mmol/L (ref 3.5–5.2)
Sodium: 137 mmol/L (ref 134–144)
eGFR: 59 mL/min/{1.73_m2} — ABNORMAL LOW (ref >59)

## 2023-01-10 LAB — MAGNESIUM: Magnesium: 2.2 mg/dL (ref 1.6–2.3)

## 2023-01-11 DIAGNOSIS — L821 Other seborrheic keratosis: Secondary | ICD-10-CM | POA: Diagnosis not present

## 2023-01-11 DIAGNOSIS — L570 Actinic keratosis: Secondary | ICD-10-CM | POA: Diagnosis not present

## 2023-01-11 DIAGNOSIS — L400 Psoriasis vulgaris: Secondary | ICD-10-CM | POA: Diagnosis not present

## 2023-01-12 ENCOUNTER — Encounter (HOSPITAL_BASED_OUTPATIENT_CLINIC_OR_DEPARTMENT_OTHER): Payer: Medicare Other | Attending: Internal Medicine | Admitting: Internal Medicine

## 2023-01-12 DIAGNOSIS — I87311 Chronic venous hypertension (idiopathic) with ulcer of right lower extremity: Secondary | ICD-10-CM | POA: Insufficient documentation

## 2023-01-12 DIAGNOSIS — I2510 Atherosclerotic heart disease of native coronary artery without angina pectoris: Secondary | ICD-10-CM | POA: Insufficient documentation

## 2023-01-12 DIAGNOSIS — Z7901 Long term (current) use of anticoagulants: Secondary | ICD-10-CM | POA: Insufficient documentation

## 2023-01-12 DIAGNOSIS — E11621 Type 2 diabetes mellitus with foot ulcer: Secondary | ICD-10-CM | POA: Insufficient documentation

## 2023-01-12 DIAGNOSIS — I509 Heart failure, unspecified: Secondary | ICD-10-CM | POA: Diagnosis not present

## 2023-01-12 DIAGNOSIS — E1151 Type 2 diabetes mellitus with diabetic peripheral angiopathy without gangrene: Secondary | ICD-10-CM | POA: Diagnosis not present

## 2023-01-12 DIAGNOSIS — Z6841 Body Mass Index (BMI) 40.0 and over, adult: Secondary | ICD-10-CM | POA: Insufficient documentation

## 2023-01-12 DIAGNOSIS — I890 Lymphedema, not elsewhere classified: Secondary | ICD-10-CM | POA: Insufficient documentation

## 2023-01-12 DIAGNOSIS — L97518 Non-pressure chronic ulcer of other part of right foot with other specified severity: Secondary | ICD-10-CM | POA: Insufficient documentation

## 2023-01-12 DIAGNOSIS — I110 Hypertensive heart disease with heart failure: Secondary | ICD-10-CM | POA: Insufficient documentation

## 2023-01-13 NOTE — Progress Notes (Signed)
 DAGAN, HEINZ ([id number]) [id number] Page 1 of 3 Visit Report for 01/12/2023 Arrival Information Details Patient Name: Date of Service: [name], [name] 01/12/2023 3:15 PM Medical Record Number: [id number] Patient Account Number: [id number] Date of Birth/Sex: Treating RN: 1955-03-20 (68 y.o. NETTY Pontes, Geminus.gell Primary Care Burna Atlas: Loreli Fallow Other Clinician: Referring Elber Galyean: Treating Leimomi Zervas/Extender: Rufus Ozell Loreli Fallow Devra in Treatment: 75 Visit Information History Since Last Visit Added or deleted any medications: No Patient Arrived: [name] Any new allergies or adverse reactions: No Arrival Time: 15:00 Had a fall or experienced change in No Accompanied By: self activities of daily living that may affect Transfer Assistance: None risk of falls: Patient Identification Verified: Yes Signs or symptoms of abuse/neglect since last visito No Secondary Verification Process Completed: Yes Hospitalized since last visit: No Patient Requires Transmission-Based Precautions: No Implantable device outside of the clinic excluding No Patient Has Alerts: Yes cellular tissue based products placed in the center Patient Alerts: Patient on Blood Thinner since last visit: 01/2021 ABI L 1.13 R 1.06 Has Dressing in Place as Prescribed: Yes 01/2021 TBI L amp R 0.8 Has Compression in Place as Prescribed: Yes Pain Present Now: No Electronic Signature(s) Signed: 01/13/2023 12:56:04 PM By: Pontes Nestle RN, BSN Entered By: Pontes Nestle on 01/12/2023 13:20:44 -------------------------------------------------------------------------------- Compression Therapy Details Patient Name: Date of Service: Koepke, DA ROLAND D. 01/12/2023 3:15 PM Medical Record Number: [id number] Patient Account Number: [id number] Date of Birth/Sex: Treating RN: 1955-01-30 (67 y.o. NETTY Pontes Nestle Primary Care Jabaree Mercado: Loreli Fallow Other Clinician: Referring Cavan Bearden: Treating  Selicia Windom/Extender: Rufus Ozell Loreli Fallow Devra in Treatment: 75 Compression Therapy Performed for Wound Assessment: Wound #7 Right,Medial Malleolus Performed By: Clinician Pontes Nestle, RN Compression Type: Double Layer Electronic Signature(s) Signed: 01/13/2023 12:56:04 PM By: Pontes Nestle RN, BSN Entered By: Pontes Nestle on 01/12/2023 13:21:08 -------------------------------------------------------------------------------- Encounter Discharge Information Details Patient Name: Date of Service: Geisel, [name] 01/12/2023 3:15 PM Medical Record Number: [id number] Patient Account Number: [id number] KENSLEY, LARES ([id number]) [id number] Page 2 of 3 Date of Birth/Sex: Treating RN: 16-Jul-1955 (68 y.o. NETTY Pontes Nestle Primary Care Keirsten Matuska: Other Clinician: Loreli Fallow Referring Natalio Salois: Treating Sindee Stucker/Extender: Rufus Ozell Loreli Fallow Devra in Treatment: 75 Encounter Discharge Information Items Discharge Condition: Stable Ambulatory Status: Walker Discharge Destination: Home Transportation: Private Auto Accompanied By: self Schedule Follow-up Appointment: Yes Clinical Summary of Care: Electronic Signature(s) Signed: 01/13/2023 12:56:04 PM By: Pontes Nestle RN, BSN Entered By: Pontes Nestle on 01/12/2023 13:21:30 -------------------------------------------------------------------------------- Wound Assessment Details Patient Name: Date of Service: Henegar, [name] 01/12/2023 3:15 PM Medical Record Number: [id number] Patient Account Number: [id number] Date of Birth/Sex: Treating RN: 03/19/1955 (68 y.o. NETTY Pontes, Geminus.gell Primary Care Morey Andonian: Loreli Fallow Other Clinician: Referring Artesia Berkey: Treating Nihaal Friesen/Extender: Rufus Ozell Loreli Fallow Devra in Treatment: 75 Wound Status Wound Number: 7 Primary Etiology: Venous Leg Ulcer Wound Location: Right, Medial Malleolus Secondary Etiology: Lymphedema Wounding Event: Blister Wound  Status: Open Date Acquired: 07/21/2021 Weeks Of Treatment: 75 Clustered Wound: No Wound Measurements Length: (cm) 2.1 Width: (cm) 0.4 Depth: (cm) 0.1 Area: (cm) 0.66 Volume: (cm) 0.066 % Reduction in Area: 98.3% % Reduction in Volume: 99.8% Wound Description Classification: Full Thickness With Exposed Support Exudate Amount: Medium Exudate Type: Serosanguineous Exudate Color: red, brown Structures Periwound Skin Texture Texture Color No Abnormalities Noted: No No Abnormalities Noted: No Moisture No Abnormalities Noted: No Treatment Notes Wound #7 (Malleolus) Wound Laterality: Right, Medial Cleanser Soap and Water  Discharge Instruction: May shower and wash wound with dial antibacterial soap and  water  prior to dressing change. Vashe 5.8 (oz) Discharge Instruction: Cleanse the wound with Vashe prior to applying a clean dressing using gauze sponges, not tissue or cotton balls. HELMUT, HENNON ([id number]) [id number] Page 3 of 3 Peri-Wound Care Sween Lotion (Moisturizing lotion) Discharge Instruction: Apply moisturizing lotion as directed Topical Gentamicin  Discharge Instruction: As directed by physician Primary Dressing Endoform 2x2 in Discharge Instruction: Moisten with saline Secondary Dressing Zetuvit Plus 4x8 in Discharge Instruction: Apply over primary dressing as directed. Secured With Compression Wrap Urgo K2, (equivalent to a 4 layer) two layer compression system, regular Discharge Instruction: Apply Urgo K2 as directed (alternative to 4 layer compression). Compression Stockings Add-Ons Electronic Signature(s) Signed: 01/13/2023 12:56:04 PM By: Drury Nestle RN, BSN Entered By: Drury Nestle on 01/12/2023 13:20:56

## 2023-01-18 ENCOUNTER — Encounter (HOSPITAL_BASED_OUTPATIENT_CLINIC_OR_DEPARTMENT_OTHER): Payer: Medicare Other | Admitting: Internal Medicine

## 2023-01-18 DIAGNOSIS — I87311 Chronic venous hypertension (idiopathic) with ulcer of right lower extremity: Secondary | ICD-10-CM | POA: Diagnosis not present

## 2023-01-18 DIAGNOSIS — Z7901 Long term (current) use of anticoagulants: Secondary | ICD-10-CM | POA: Diagnosis not present

## 2023-01-18 DIAGNOSIS — E11621 Type 2 diabetes mellitus with foot ulcer: Secondary | ICD-10-CM | POA: Diagnosis not present

## 2023-01-18 DIAGNOSIS — I2510 Atherosclerotic heart disease of native coronary artery without angina pectoris: Secondary | ICD-10-CM | POA: Diagnosis not present

## 2023-01-18 DIAGNOSIS — Z6841 Body Mass Index (BMI) 40.0 and over, adult: Secondary | ICD-10-CM | POA: Diagnosis not present

## 2023-01-18 DIAGNOSIS — I110 Hypertensive heart disease with heart failure: Secondary | ICD-10-CM | POA: Diagnosis not present

## 2023-01-18 DIAGNOSIS — I509 Heart failure, unspecified: Secondary | ICD-10-CM | POA: Diagnosis not present

## 2023-01-18 DIAGNOSIS — L97518 Non-pressure chronic ulcer of other part of right foot with other specified severity: Secondary | ICD-10-CM | POA: Diagnosis not present

## 2023-01-18 DIAGNOSIS — I890 Lymphedema, not elsewhere classified: Secondary | ICD-10-CM | POA: Diagnosis not present

## 2023-01-18 DIAGNOSIS — E1151 Type 2 diabetes mellitus with diabetic peripheral angiopathy without gangrene: Secondary | ICD-10-CM | POA: Diagnosis not present

## 2023-01-19 NOTE — Progress Notes (Addendum)
 [name], [name] ([id number]) [id number] Page 1 of 12 Visit Report for 01/18/2023 Chief Complaint Document Details Patient Name: Date of Service: [name], [name] 01/18/2023 3:00 PM Medical Record Number: [id number] Patient Account Number: [id number] Date of Birth/Sex: Treating RN: July 17, 1955 (68 y.o. M) Primary Care Provider: Loreli Fallow Other Clinician: Referring Provider: Treating Provider/Extender: Rosan Harlene Loreli Fallow Devra in Treatment: 76 Information Obtained from: Patient Chief Complaint 08/04/2021; patient returns to clinic with bilateral leg wounds as well as areas on the right foot Electronic Signature(s) Signed: 01/20/2023 11:00:26 AM By: Rosan Harlene DO Entered By: Rosan Harlene on 01/20/2023 07:46:55 -------------------------------------------------------------------------------- HPI Details Patient Name: Date of Service: [name], [name]. 01/18/2023 3:00 PM Medical Record Number: [id number] Patient Account Number: [id number] Date of Birth/Sex: Treating RN: 1955/03/22 (68 y.o. M) Primary Care Provider: Loreli Fallow Other Clinician: Referring Provider: Treating Provider/Extender: Rosan Harlene Loreli Fallow Devra in Treatment: 57 History of Present Illness HPI Description: ADMISSION 03/22/2021 This is a 68 year old man with a past medical history significant for diabetes type 2, congestive heart failure, peripheral arterial disease, morbid obesity, venous insufficiency, and coronary artery disease. He has been followed by Dr. Alona in podiatry, who performed a transmetatarsal amputation on the left foot in August 2022. He had issues healing that wound, but based upon Dr. Jimmie notes, ultimately the TMA wound healed. During his recovery from that surgery, however, ulcers opened up over the DIP joint of the right second and third toe. These have apparently closed and reopened multiple times. It sounds like one of the issues has been  moisture accumulation and maceration of the tissues causing them to reopen. At his last visit with Dr. Alona, on March 01, 2021, there continues to be problems with moisture and he was referred to wound care for further evaluation and management. He had a formal aortogram with runoff performed prior to his TMA. The findings are copied here: Patient has inline flow to both feet with no significant flow-limiting lesion that would be amenable to percutaneous or open revascularization. He does have an element of small vessel disease and has a short segment occlusion of the distal anterior tibial/dorsalis pedis artery on the left foot but does have posterior tibial artery flow. Would recommend management of wounds with amputation of toes 2 and 3 on the right foot if the wounds do not heal and deteriorate. Transmetatarsal amputation on the left side has as good a blood supply as it is going to get and hopefully this will heal in the future. Formal ABIs were done in January 2023. They are normal bilaterally. ABI Findings: +---------+------------------+-----+----------+--------+ Right Rt Pressure (mmHg)IndexWaveform Comment  +---------+------------------+-----+----------+--------+ Brachial 160     +---------+------------------+-----+----------+--------+ PTA 192 1.06 monophasic  +---------+------------------+-----+----------+--------+ DP 159 0.88 monophasic  +---------+------------------+-----+----------+--------+ Great T oe145 0.80    +---------+------------------+-----+----------+--------+ +--------+------------------+-----+---------+-------+ [name] ([id number]) [id number] Page 2 of 12 Left Lt Pressure (mmHg)IndexWaveform Comment +--------+------------------+-----+---------+-------+ [id number]     +--------+------------------+-----+---------+-------+ PTA 204 1.13 triphasic   +--------+------------------+-----+---------+-------+ DP 194 1.07 biphasic   +--------+------------------+-----+---------+-------+ +-------+-----------+-----------+------------+------------+ ABI/TBIT oday's ABIT oday's TBIPrevious ABIPrevious TBI +-------+-----------+-----------+------------+------------+ Right 1.06 0.8 1.26 0.65  +-------+-----------+-----------+------------+------------+ Left 1.13 amputation 1.15 amputation  +-------+-----------+-----------+------------+------------+ Previous ABI on 08/06/20 at [facility] Pedal pressures falsely elevated due to medial calcification. Summary: Right: Resting right ankle-brachial index is within normal range. The right toe-brachial index is normal. Left: Resting left ankle-brachial index is within normal range. READMISSION 08/04/2021 [name] is now a 68 year old man who I remember from this clinic many years ago I think he  had a right lower extremity predominantly venous wound at the time. He was here for 1 visit in March of this year had wounds on his right second and third toes we apparently dressed them many and they healed so he did not come back. He is listed in [place] is being a diabetic although the patient denies this says he is verified it with his primary doctor. In any case over the last several weeks or so according the patient although these wounds look somewhat more chronic than that he has developed predominantly large wounds on the right medial and right lateral ankle smaller areas on the left leg and areas on the dorsal aspect of his right second and third toes. Its not clear how he has been dressing these. 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 The patient has known PAD. He had an angiogram in August 2022 at which time he had nonhealing wounds in both feet. On the left his major vessels in the thigh were all  patent. He had three-vessel patent to the level of the ankle. He had a very short occlusion in the left anterior tibial. On the right lower extremity the major vessels in his thigh were all patent. He had three-vessel runoff to the foot sluggish filling of the anterior tibial artery. He was felt to have some component of small vessel disease but nothing that was amenable or needed revascularization. It was recommended that he have amputation of the second and third toes on the right foot if they did not heal He has been following with Dr. Alona of podiatry. Dr. Alona got him juxta lite stockings although I do not think he had them on properly he has uncontrolled edema in both legs Past medical history includes type 2 diabetes [although the patient really denies this], left TMA in 2022,lower extremity wounds in fact attendance at this clinic in 2009-2010, A-fib on Eliquis , chronic venous insufficiency with secondary lymphedema history of non-Hodgkin's lymphoma. 08-11-2021 upon evaluation today patient presents for follow-up evaluation he was seen last Wednesday initially for inspection here in our clinic. With that being said he tells me that he unfortunately has been having a lot of drainage and is actually coming through his wrap. Fortunately I do not see any evidence of active infection locally or systemically at this time which is great news. No fevers, chills, nausea, vomiting, or diarrhea. With that being said there does appear to be some evidence of local infection based on what I am seeing today. 08-18-2021 upon evaluation today patient appears to be doing okay currently in regard to his wounds with that being said that he is doing much better but still has a long ways to go to get to where he wanted to be. I think the infection is significantly improved. He has another week of the antibiotic at this point. 08-25-2021 upon evaluation today patient's wounds are actually doing decently well he has  erythema has significantly improved. I think the cellulitis is under controlled I am going to continue him on 2 more weeks of the Levaquin  at 500 mg this is a lower dose but I am hoping it will be better for him. 09-08-2021 upon evaluation today patient appears to be doing excellent in regard to his wounds. Since I last saw him he was actually in the hospital where he ended up having a pacemaker put in. Subsequently he tells me that  he is actually doing quite well although they were unsure whether they were going to do it due to the fact that he had the wounds on his legs. And then I am glad they did anything seem to be doing well. 09-15-2021 upon evaluation patient's wounds are actually showing signs of improvement. The right side wounds do appear to have some need for sharp debridement today and I Dimas go ahead and proceed with that. I think that if we get the wounds cleaned up he will actually show signs of continued improvement. I am also leaning towards switching to Hydrofera Blue which I think will be a much better option for him. 09-22-2021 patient's wounds are showing signs of excellent improvement. I am actually extremely pleased with where we stand and I think that the patient is making great progress. There does not appear to be any signs of active infection. 09-29-2021 upon evaluation today patient appears to be doing excellent in regard to his wounds. He is actually tolerating the dressing changes without complication. Fortunately I see no evidence of active infection locally or systemically at this time which is great news and overall I am extremely pleased with where we stand currently. [id number] upon evaluation today patient actually appears to be doing excellent in regard to his wounds. The left leg is almost completely healed the right ankle is significantly smaller. Overall I am extremely pleased with where we stand at this point. I do not see any evidence of active infection at this  time. 10-13-2021 upon evaluation today patient appears to be doing excellent in regard to his wounds. I really feel like he is making good progress here and I am very pleased in that regard. Fortunately I do not see any signs of active infection at this time. We are using the [name] topical antibiotic therapy. 10-20-2021 upon evaluation today patient appears to be doing well with regard to his wound on the right medial ankle region the left leg is almost completely healed. I am actually very pleased with where we stand today. 11-03-2021 upon evaluation today patient's wound actually is going require some sharp debridement but appears to be doing much better which is great news. Fortunately I do not see any signs of active infection at this time. 11-10-2021 upon evaluation today patient's wound is actually showing signs of excellent improvement. Fortunately I do not see any evidence of infection locally [name], [name] ([id number]) [id number] Page 3 of 12 or systemically which is great news and overall I am extremely pleased with where we stand today. I do believe he is making good progress he does have his [facility] topical antibiotics with him here today. 11-24-2021 upon evaluation today patient actually showing signs of excellent improvement this appears to be doing much better. Fortunately I do not see any evidence of infection locally or systemically at this time. 12-01-2021 upon evaluation today patient appears to be doing well currently in regard to his wound. He has been tolerating the dressing changes without complication. Fortunately I do not see any evidence of active infection at this time which is great news and overall I am extremely pleased with where we stand today. 12-15-2021 upon evaluation today patient appears to be doing well with regard to his wound. He is showing signs of improvement is slow but nonetheless we are making improvements here. Fortunately I  do not see any evidence of active infection locally nor systemically at this time. We are still using the [facility] topical antibiotic over the  open area only. 12-29-2021 upon evaluation today patient's wound actually is showing signs of excellent improvement. It has been 2 weeks since I perform any debridement and it definitely shows he has some tissue that needs to be cleaned away but I think we can do so quite easily and readily today. The good news is I do not see any signs of infection I think he is doing much better in that regard. Overall I am extremely pleased with where we stand. 01-12-2022 upon evaluation today patient appears to be doing well currently in regard to his wound although it is not getting significantly smaller it is also not getting any larger. Fortunately I do not see any evidence of infection locally nor systemically which is great news and overall I am extremely pleased with where things stand currently. 01-19-2022 upon evaluation today patient appears to be doing well currently in regard to his wound. The PolyMem actually seems to have done extremely well for him. Fortunately I do not see any signs of infection locally nor systemically at this time. 01-26-2022 upon evaluation today patient appears to be making progress. Fortunately there does not appear to be any signs of infection which is great news. No fevers, chills, nausea, vomiting, or diarrhea. With that being said this is very slow to heal and although it is smaller I still feel like we may want to try to do something to speed this up I think that a skin substitute could be beneficial, look into Kerecis. 02-02-2022 upon evaluation today patient's wound is actually showing signs of excellent improvement. I do not see any evidence of infection and overall I think that we are headed in the right direction. Fortunately I think that he is tolerating the dressing changes without complication. 02-09-2022 upon evaluation today  patient appears to be doing well currently in regard to his wound. It does look a little bit macerated but fortunately does not appear to be showing any signs of significant skin breakdown over the macerated area which is good news. Fortunately I do not see any evidence of active infection locally nor systemically at this point which is great news. 02-23-2022 upon evaluation today patient appears to be doing well currently in regard to his wounds. In fact his area on the ankle is actually showing signs of healing quite nicely. We do have the Apligraf ready today I am hopeful this is going to speed things up and get this closed much more effectively and quickly. Fortunately I do not see any signs of active infection locally nor systemically at this time which is great news. 03-02-2022 upon evaluation today patient appears to be doing excellent in regard to his wound. He is actually been tolerating the dressing changes without complication the wound on the left medial lower extremity is doing quite well and Apligraf seems to have been extremely beneficial for him. I am extremely pleased with where we stand today. 03/16/2022: The wound measurements are smaller today. He has accumulation of a yellow crust around the edges and slough on the surface. There is a musty odor coming from the wound. He has been getting Apligraf. 03-30-2022 upon evaluation today patient appears to be making progress. In regard to his leg ulcer. This is slow but nonetheless the Apligraf has fed things up. Fortunately I do not see any evidence of infection locally nor systemically at this time. No fevers, chills, nausea, vomiting, or diarrhea. Patient is here for Apligraf #4 today. 04-13-2022 upon evaluation today patient appears to  be doing a little bit more poorly in regard to his wound. He actually feels like his wrap may have been a little bit tight. Fortunately there does not appear to be any signs of active infection locally nor  systemically which is great news and I am pleased in that regard. 04-20-2022 upon evaluation today patient appears to be doing better in regard to his wound from the standpoint of swelling he is actually lost 33 pounds on torsemide  in the past week his leg is significantly smaller compared to what it has been. Fortunately I do not see any signs of active infection locally nor systemically at this time. 04-27-2022 upon evaluation today patient's wound actually is showing signs of erythema and warmth around the edges of the wound I am actually concerned about the possibility of infection. I think we probably need to obtain a wound culture and also can recommend based on what we are seeing that we go ahead and have the patient continue to utilize the compression wrapping which I think has been of benefit. 05-04-2022 upon evaluation today patient appears to be doing well currently in regard to his wound. He has been tolerating the dressing changes without complication. Fortunately there does not appear to be any signs of active infection locally or systemically which is great news. 05-11-2022 upon evaluation today patient actually appears to be doing excellent. He has been tolerating the dressing changes we actually were trialing a new medication on him last week and this was the ergo clean Ag dressing. With that being said this actually seems to have done extremely well at this point. Fortunately I do not see any signs of active infection locally or systemically at this time. 05-18-2022 upon evaluation today patient appears to be doing okay in regard to his wound I do not see any signs of worsening also notes any signs of significant improvement. Fortunately I do believe that removing in the right direction in general. 05-25-22 upon evaluation today patient appears to be doing well currently in regard to his wound. He has been tolerating the dressing changes without complication. Fortunately I do not see any  signs of active infection locally nor systemically which is great news and overall I am extremely pleased with where we stand currently. There does not appear to be any signs of active infection locally nor systemically which is great news. No fevers, chills, nausea, vomiting, or diarrhea. 06-01-2022 upon evaluation today patient actually appears to be making excellent progress in regard to his ankle region. I am very pleased with where we stand I think that he is doing extremely well and very pleased with endoform and drawtex combination. 06-08-2022 upon evaluation today patient appears to be doing well currently in regard to his wound. He has been making good progress and I feel like practicing improvement in the overall size of the wound. I am very pleased in that regard. Fortunately I do not see any signs of active infection locally nor systemically which is great news. No fevers, chills, nausea, vomiting, or diarrhea. 06-15-2022 upon evaluation today patient appears to be doing well currently in regard to his wound. He has been tolerating the dressing changes without complication. I do believe the endoform has been extremely helpful for him up to this point. I do not see any signs of active infection locally nor systemically which is great news and in general I do believe that we are making pretty good progress here which is excellent news. 06-22-2022 upon evaluation today  patient appears to be doing well currently in regard to his wound. He has been tolerating the dressing changes without complication. Fortunately there does not appear to be any signs of active infection at this time which is great news. I think that he is actually taking good progress with the endoform which is excellent as well. [name], [name] ([id number]) [id number] Page 4 of 12 06-29-2022 upon evaluation patient appears to be doing well currently in regard to his wound. He is actually making good  progress towards closure and I am very pleased in that regard. I do not see any evidence of active infection locally nor systemically which is great news and in general I do believe that we are making excellent progress here overall. 07-06-2022 upon evaluation today patient appears to be doing well currently in regard to his wound. He has been tolerating the dressing changes without complication. The good news is he is slowly getting smaller each time I see him. 07-13-2022 upon evaluation today patient appears to be doing well currently in regard to his wound is slowly showing signs of improvement. Fortunately I do not see any signs of infection at this time. No fevers, chills, nausea, vomiting, or diarrhea. 07-20-2022 upon evaluation today patient appears to be doing well currently in regard to his wound. He has been tolerating the dressing changes without complication. Fortunately I do not see any evidence of active infection locally or systemically which is great news and in general I do believe that we are making headway towards complete closure. This is excellent news. With that being said we are preparing for him to be leaving to go out of town in about a week and a half. For that reason we will going to make a few changes to his dressing today in order to give him something that I think will be easier for him to do when he is out of town. 07-27-2022 upon evaluation today patient appears to be doing well currently in regard to his wound. He has been tolerating the dressing changes without complication. Fortunately there does not appear to be any signs of infection. Fortunately there does not appear to be any signs of infection 08-10-2022 upon evaluation today patient appears to be doing okay in regard to his wound. He actually has been gone for 2 weeks and in the interim unfortunately he did not have result out of the Tegaderm as well as hoping for but nonetheless took good care of his wound. Does not  appear to be infected I do think we need to go back to the compression wrap however which I think would definitely benefit him. Fortunately I do not see any signs of active infection at this time which is great news. 08-17-2022 upon evaluation today patient unfortunately appears to be showing signs of infection based on what I see at this time. Fortunately I do not see any evidence of systemic infection though locally I do definitely see some changes that are concerning. I do not think that this is systemic at all. 08-24-2022 upon evaluation today patient appears to be doing well currently in regard to his wound. He has been tolerating the dressing changes this looks significantly improved compared to last week and very pleased. I do not see any signs of active infection locally or systemically at this time. 08-31-2022 upon evaluation today patient appears to be doing well currently in regard to his wound this is actually showing signs of improvement and actually very pleased with where  we stand today. Fortunately I do not see any signs of active infection which is great news. 09-14-2022 upon evaluation today patient appears to be doing well current regard to his wound with section showing signs of improvement would be using endoform which I think is really doing a great job here. Fortunately I do not see any signs of worsening overall. 09-21-2022 upon evaluation today patient appears to be doing well currently in regard to his wound. He has been tolerating the dressing changes without complication. Fortunately there does not appear to be any signs of active infection at this time which is good news. 09-28-2022 upon evaluation today patient appears to be doing well currently in regard to his wound which is actually measuring quite a bit smaller. This looks to be doing well and very pleased with where things stand today I do not see any signs of active infection locally or systemically which is great news. No  fevers, chills, nausea, vomiting, or diarrhea. 10-05-2022 upon evaluation today patient appears to be doing excellent in regard to his wounds. He has been tolerating the dressing changes without complication and in general I do believe that we will make an really good headway towards complete closure. 10/12/2022 upon evaluation today patient's wound is actually showing signs of excellent improvement. I am actually very pleased with where we stand I think that he is moving in the right direction and this is excellent news. I do not see any evidence of worsening overall 10-19-2022 upon evaluation today patient appears to be doing well currently in regard to his wound. He is showing signs of improvement and very pleased with where we stand I think that is making good progress here. 10-26-2022 upon evaluation today patient appears to be doing well currently in regard to his wound. In fact this is showing signs of excellent improvement I am actually very pleased with where we stand I do believe that he is making excellent headway towards complete closure which is great news as well. No fevers, chills, nausea, vomiting, or diarrhea. 11-02-2022 upon evaluation today patient appears to be doing great in regard to his wound although he does have an area at the top of the wrap which sometimes gets moist which to be honest he actually appears to have a little bit of a fungal infection at this point. I think that we may need to treat this he can get to it and put some cream in there he actually does that on his own UA with some CeraVe. 11-09-2022 upon evaluation today patient's wound bed actually showed signs of good granulation epithelization at this point. Fortunately I do not see any signs of worsening overall and I do believe that the patient is making good headway towards closure. Patient's wound unfortunately at the top of the wrap appears to be still causing him problems despite the cream that we have been  using over the past week. We are going to try an oral antibiotic at this time. 11/20; patient has a longstanding wound on the right medial ankle. He has very healthy looking granulation today and the wound is measuring smaller. We have been using gent and endoform. There is seems little reason to change this 12/18; longstanding wound on the right medial ankle. Wound is measuring smaller healthy granulation we have been using endoform as the primary dressing. Under illumination today probably 70 to 80% epithelialization 01/20/2023; patient has a wound to the right medial ankle and we have been using endoform with gentamicin   under 4-layer compression. Patient has no issues or complaints today. He denies signs of infection. Electronic Signature(s) Signed: 01/20/2023 11:00:26 AM By: Rosan Raisin DO Entered By: Rosan Raisin on 01/20/2023 07:47:51 -------------------------------------------------------------------------------- Physical Exam Details Patient Name: Date of Service: Glas, [name]. 01/18/2023 3:00 PM Niebla, SALOMON [name] ([id number]) [id number] Page 5 of 12 Medical Record Number: [id number] Patient Account Number: [id number] Date of Birth/Sex: Treating RN: June 25, 1955 (68 y.o. M) Primary Care Provider: Loreli Fallow Other Clinician: Referring Provider: Treating Provider/Extender: Rosan Raisin Loreli Fallow Devra in Treatment: 76 Constitutional respirations regular, non-labored and within target range for patient.. Cardiovascular 2+ dorsalis pedis/posterior tibialis pulses. Psychiatric pleasant and cooperative. Notes T the right medial ankle there is an open wound with granulation tissue throughout. No signs of surrounding infection. Good edema control. o Electronic Signature(s) Signed: 01/20/2023 11:00:26 AM By: Rosan Raisin DO Entered By: Rosan Raisin on 01/20/2023  07:48:24 -------------------------------------------------------------------------------- Physician Orders Details Patient Name: Date of Service: Fregeau, [name]. 01/18/2023 3:00 PM Medical Record Number: [id number] Patient Account Number: [id number] Date of Birth/Sex: Treating RN: [birth year] (68 y.o. NETTY Drury Nestle Primary Care Provider: Loreli Fallow Other Clinician: Referring Provider: Treating Provider/Extender: Rosan Raisin Loreli Fallow Devra in Treatment: 76 The following information was scribed by: Drury Nestle The information was scribed for: Rosan Raisin Verbal / Phone Orders: No Diagnosis Coding Follow-up Appointments ppointment in 1 week. - Dr. Rosan Return A ppointment in 2 weeks. - Dr. Rosan Return A Return appointment in 3 weeks. - Dr. Kalaysia Demonbreun Anesthetic (In clinic) Topical Lidocaine  4% applied to wound bed Cellular or Tissue Based Products Cellular or Tissue Based Product Type: - Run IVR for Kerrecis- denied #1 Apligraf applied 02/23/2022 #2 Apligraf applied 03/02/2022 #3 Apligraf applied 03/16/2022 #4 Apligraf applied 03/30/2022 HOLD APLIGRAF this week. Cellular or Tissue Based Product applied to wound bed, secured with steri-strips, cover with Adaptic or Mepitel. (DO NOT REMOVE). Bathing/ Shower/ Hygiene May shower with protection but do not get wound dressing(s) wet. Protect dressing(s) with water  repellant cover (for example, large plastic bag) or a cast cover and may then take shower. Edema Control - Orders / Instructions Elevate legs to the level of the heart or above for 30 minutes daily and/or when sitting for 3-4 times a day throughout the day. Avoid standing for long periods of time. Patient to wear own compression stockings every day. - left leg Exercise regularly Moisturize legs daily. Compression stocking or Garment 30-40 mm/Hg pressure to: Non Wound Condition Right Lower Extremity pply the following to affected area as directed: -  ketoconazole  A OCEAN, SCHILDT [name] ([id number]) [phone].pdf Page 6 of 12 Wound Treatment Wound #7 - Malleolus Wound Laterality: Right, Medial Cleanser: Soap and Water  1 x Per Week/30 Days Discharge Instructions: May shower and wash wound with dial antibacterial soap and water  prior to dressing change. Cleanser: Vashe 5.8 (oz) 1 x Per Week/30 Days Discharge Instructions: Cleanse the wound with Vashe prior to applying a clean dressing using gauze sponges, not tissue or cotton balls. Peri-Wound Care: Sween Lotion (Moisturizing lotion) 1 x Per Week/30 Days Discharge Instructions: Apply moisturizing lotion as directed Topical: Gentamicin  1 x Per Week/30 Days Discharge Instructions: As directed by physician Prim Dressing: Endoform 2x2 in (Dispense As Written) 1 x Per Week/30 Days ary Discharge Instructions: Moisten with saline Secondary Dressing: Zetuvit Plus 4x8 in 1 x Per Week/30 Days Discharge Instructions: Apply over primary dressing as directed. Compression Wrap: Urgo K2, (equivalent to a 4 layer) two layer compression system, regular 1 x  Per Week/30 Days Discharge Instructions: Apply Urgo K2 as directed (alternative to 4 layer compression). Electronic Signature(s) Signed: 01/20/2023 11:00:26 AM By: Rosan Raisin DO Previous Signature: 01/18/2023 5:26:35 PM Version By: Rosan Raisin DO Previous Signature: 01/18/2023 5:28:22 PM Version By: Drury Nestle RN, BSN Entered By: Rosan Raisin on 01/20/2023 07:48:41 -------------------------------------------------------------------------------- Problem List Details Patient Name: Date of Service: Frisina, [name]. 01/18/2023 3:00 PM Medical Record Number: [id number] Patient Account Number: [id number] Date of Birth/Sex: Treating RN: [birth year] (68 y.o. M) Primary Care Provider: Loreli Fallow Other Clinician: Referring Provider: Treating Provider/Extender: Rosan Raisin Loreli Fallow Devra in Treatment: 76 Active  Problems ICD-10 Encounter Code Description Active Date MDM Diagnosis L97.518 Non-pressure chronic ulcer of other part of right foot with other specified 08/04/2021 No Yes severity E11.621 Type 2 diabetes mellitus with foot ulcer 08/04/2021 No Yes I87.311 Chronic venous hypertension (idiopathic) with ulcer of right lower extremity 01/18/2023 No Yes I89.0 Lymphedema, not elsewhere classified 08/04/2021 No Yes Inactive Problems Resolved Problems ICD-10 [name], [name] ([id number]) [phone].pdf Page 7 of 12 Code Description Active Date Resolved Date L97.828 Non-pressure chronic ulcer of other part of left lower leg with other specified severity 08/04/2021 08/04/2021 L97.818 Non-pressure chronic ulcer of other part of right lower leg with other specified severity 08/04/2021 08/04/2021 I87.333 Chronic venous hypertension (idiopathic) with ulcer and inflammation of bilateral lower 08/04/2021 08/04/2021 extremity Electronic Signature(s) Signed: 01/20/2023 11:00:26 AM By: Rosan Raisin DO Entered By: Rosan Raisin on 01/20/2023 07:46:39 -------------------------------------------------------------------------------- Progress Note Details Patient Name: Date of Service: Gage, [name]. 01/18/2023 3:00 PM Medical Record Number: [id number] Patient Account Number: [id number] Date of Birth/Sex: Treating RN: 04/07/1955 (68 y.o. M) Primary Care Provider: Loreli Fallow Other Clinician: Referring Provider: Treating Provider/Extender: Rosan Raisin Loreli Fallow Devra in Treatment: 74 Subjective Chief Complaint Information obtained from Patient 08/04/2021; patient returns to clinic with bilateral leg wounds as well as areas on the right foot History of Present Illness (HPI) ADMISSION 03/22/2021 This is a 68 year old man with a past medical history significant for diabetes type 2, congestive heart failure, peripheral arterial disease, morbid obesity, venous insufficiency, and  coronary artery disease. He has been followed by Dr. Alona in podiatry, who performed a transmetatarsal amputation on the left foot in August 2022. He had issues healing that wound, but based upon Dr. Jimmie notes, ultimately the TMA wound healed. During his recovery from that surgery, however, ulcers opened up over the DIP joint of the right second and third toe. These have apparently closed and reopened multiple times. It sounds like one of the issues has been moisture accumulation and maceration of the tissues causing them to reopen. At his last visit with Dr. Alona, on March 01, 2021, there continues to be problems with moisture and he was referred to wound care for further evaluation and management. He had a formal aortogram with runoff performed prior to his TMA. The findings are copied here: Patient has inline flow to both feet with no significant flow-limiting lesion that would be amenable to percutaneous or open revascularization. He does have an element of small vessel disease and has a short segment occlusion of the distal anterior tibial/dorsalis pedis artery on the left foot but does have posterior tibial artery flow. Would recommend management of wounds with amputation of toes 2 and 3 on the right foot if the wounds do not heal and deteriorate. Transmetatarsal amputation on the left side has as good a blood supply as it is going to get and hopefully this will heal  in the future. Formal ABIs were done in January 2023. They are normal bilaterally. ABI Findings: +---------+------------------+-----+----------+--------+ Right Rt Pressure (mmHg)IndexWaveform Comment  +---------+------------------+-----+----------+--------+ Brachial 160    +---------+------------------+-----+----------+--------+ PTA 192 1.06 monophasic  +---------+------------------+-----+----------+--------+ DP 159 0.88 monophasic   +---------+------------------+-----+----------+--------+ Great T oe145 0.80    +---------+------------------+-----+----------+--------+ +--------+------------------+-----+---------+-------+ Left Lt Pressure (mmHg)IndexWaveform Comment +--------+------------------+-----+---------+-------+ [id number]    +--------+------------------+-----+---------+-------+ PTA 204 1.13 triphasic  +--------+------------------+-----+---------+-------+ DP 194 1.07 biphasic   +--------+------------------+-----+---------+-------+ +-------+-----------+-----------+------------+------------+ ABI/TBIT oday's ABIT oday's TBIPrevious ABIPrevious TBI [name], [name] ([id number]) [id number] Page 8 of 12 +-------+-----------+-----------+------------+------------+ Right 1.06 0.8 1.26 0.65  +-------+-----------+-----------+------------+------------+ Left 1.13 amputation 1.15 amputation  +-------+-----------+-----------+------------+------------+ Previous ABI on 08/06/20 at [facility] Pedal pressures falsely elevated due to medial calcification. Summary: Right: Resting right ankle-brachial index is within normal range. The right toe-brachial index is normal. Left: Resting left ankle-brachial index is within normal range. READMISSION 08/04/2021 Mr. Okerlund is now a 68 year old man who I remember from this clinic many years ago I think he had a right lower extremity predominantly venous wound at the time. He was here for 1 visit in March of this year had wounds on his right second and third toes we apparently dressed them many and they healed so he did not come back. He is listed in [place] is being a diabetic although the patient denies this says he is verified it with his primary doctor. In any case over the last several weeks or so according the patient although these wounds look somewhat more chronic than that he has developed predominantly  large wounds on the right medial and right lateral ankle smaller areas on the left leg and areas on the dorsal aspect of his right second and third toes. Its not clear how he has been dressing these. 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 The patient has known PAD. He had an angiogram in August 2022 at which time he had nonhealing wounds in both feet. On the left his major vessels in the thigh were all patent. He had three-vessel patent to the level of the ankle. He had a very short occlusion in the left anterior tibial. On the right lower extremity the major vessels in his thigh were all patent. He had three-vessel runoff to the foot sluggish filling of the anterior tibial artery. He was felt to have some component of small vessel disease but nothing that was amenable or needed revascularization. It was recommended that he have amputation of the second and third toes on the right foot if they did not heal He has been following with Dr. Alona of podiatry. Dr. Alona got him juxta lite stockings although I do not think he had them on properly he has uncontrolled edema in both legs Past medical history includes type 2 diabetes [although the patient really denies this], left TMA in 2022,lower extremity wounds in fact attendance at this clinic in 2009-2010, A-fib on Eliquis , chronic venous insufficiency with secondary lymphedema history of non-Hodgkin's lymphoma. 08-11-2021 upon evaluation today patient presents for follow-up evaluation he was seen last Wednesday initially for inspection here in our clinic. With that being said he tells me that he unfortunately has been having a lot of drainage and is actually coming through his wrap. Fortunately I do not see any evidence of active infection locally or systemically at this time which is great news. No fevers, chills, nausea, vomiting, or diarrhea. With that being said there  does  appear to be some evidence of local infection based on what I am seeing today. 08-18-2021 upon evaluation today patient appears to be doing okay currently in regard to his wounds with that being said that he is doing much better but still has a long ways to go to get to where he wanted to be. I think the infection is significantly improved. He has another week of the antibiotic at this point. 08-25-2021 upon evaluation today patient's wounds are actually doing decently well he has erythema has significantly improved. I think the cellulitis is under controlled I am going to continue him on 2 more weeks of the Levaquin  at 500 mg this is a lower dose but I am hoping it will be better for him. 09-08-2021 upon evaluation today patient appears to be doing excellent in regard to his wounds. Since I last saw him he was actually in the hospital where he ended up having a pacemaker put in. Subsequently he tells me that he is actually doing quite well although they were unsure whether they were going to do it due to the fact that he had the wounds on his legs. And then I am glad they did anything seem to be doing well. 09-15-2021 upon evaluation patient's wounds are actually showing signs of improvement. The right side wounds do appear to have some need for sharp debridement today and I Dimas go ahead and proceed with that. I think that if we get the wounds cleaned up he will actually show signs of continued improvement. I am also leaning towards switching to Hydrofera Blue which I think will be a much better option for him. 09-22-2021 patient's wounds are showing signs of excellent improvement. I am actually extremely pleased with where we stand and I think that the patient is making great progress. There does not appear to be any signs of active infection. 09-29-2021 upon evaluation today patient appears to be doing excellent in regard to his wounds. He is actually tolerating the dressing changes  without complication. Fortunately I see no evidence of active infection locally or systemically at this time which is great news and overall I am extremely pleased with where we stand currently. [id number] upon evaluation today patient actually appears to be doing excellent in regard to his wounds. The left leg is almost completely healed the right ankle is significantly smaller. Overall I am extremely pleased with where we stand at this point. I do not see any evidence of active infection at this time. 10-13-2021 upon evaluation today patient appears to be doing excellent in regard to his wounds. I really feel like he is making good progress here and I am very pleased in that regard. Fortunately I do not see any signs of active infection at this time. We are using the [facility] topical antibiotic therapy. 10-20-2021 upon evaluation today patient appears to be doing well with regard to his wound on the right medial ankle region the left leg is almost completely healed. I am actually very pleased with where we stand today. 11-03-2021 upon evaluation today patient's wound actually is going require some sharp debridement but appears to be doing much better which is great news. Fortunately I do not see any signs of active infection at this time. 11-10-2021 upon evaluation today patient's wound is actually showing signs of excellent improvement. Fortunately I do not see any evidence of infection locally or systemically which is great news and overall I am extremely pleased with where we stand today. I do  believe he is making good progress he does have his [facility] topical antibiotics with him here today. 11-24-2021 upon evaluation today patient actually showing signs of excellent improvement this appears to be doing much better. Fortunately I do not see any evidence of infection locally or systemically at this time. 12-01-2021 upon evaluation today patient appears to be doing well currently in regard to his  wound. He has been tolerating the dressing changes without complication. Fortunately I do not see any evidence of active infection at this time which is great news and overall I am extremely pleased with where we stand today. 12-15-2021 upon evaluation today patient appears to be doing well with regard to his wound. He is showing signs of improvement is slow but nonetheless we are [name], [name] ([id number]) [id number] Page 9 of 12 making improvements here. Fortunately I do not see any evidence of active infection locally nor systemically at this time. We are still using the [facility] topical antibiotic over the open area only. 12-29-2021 upon evaluation today patient's wound actually is showing signs of excellent improvement. It has been 2 weeks since I perform any debridement and it definitely shows he has some tissue that needs to be cleaned away but I think we can do so quite easily and readily today. The good news is I do not see any signs of infection I think he is doing much better in that regard. Overall I am extremely pleased with where we stand. 01-12-2022 upon evaluation today patient appears to be doing well currently in regard to his wound although it is not getting significantly smaller it is also not getting any larger. Fortunately I do not see any evidence of infection locally nor systemically which is great news and overall I am extremely pleased with where things stand currently. 01-19-2022 upon evaluation today patient appears to be doing well currently in regard to his wound. The PolyMem actually seems to have done extremely well for him. Fortunately I do not see any signs of infection locally nor systemically at this time. 01-26-2022 upon evaluation today patient appears to be making progress. Fortunately there does not appear to be any signs of infection which is great news. No fevers, chills, nausea, vomiting, or diarrhea. With that being said this is very  slow to heal and although it is smaller I still feel like we may want to try to do something to speed this up I think that a skin substitute could be beneficial, look into Kerecis. 02-02-2022 upon evaluation today patient's wound is actually showing signs of excellent improvement. I do not see any evidence of infection and overall I think that we are headed in the right direction. Fortunately I think that he is tolerating the dressing changes without complication. 02-09-2022 upon evaluation today patient appears to be doing well currently in regard to his wound. It does look a little bit macerated but fortunately does not appear to be showing any signs of significant skin breakdown over the macerated area which is good news. Fortunately I do not see any evidence of active infection locally nor systemically at this point which is great news. 02-23-2022 upon evaluation today patient appears to be doing well currently in regard to his wounds. In fact his area on the ankle is actually showing signs of healing quite nicely. We do have the Apligraf ready today I am hopeful this is going to speed things up and get this closed much more effectively and quickly. Fortunately I do not see any  signs of active infection locally nor systemically at this time which is great news. 03-02-2022 upon evaluation today patient appears to be doing excellent in regard to his wound. He is actually been tolerating the dressing changes without complication the wound on the left medial lower extremity is doing quite well and Apligraf seems to have been extremely beneficial for him. I am extremely pleased with where we stand today. 03/16/2022: The wound measurements are smaller today. He has accumulation of a yellow crust around the edges and slough on the surface. There is a musty odor coming from the wound. He has been getting Apligraf. 03-30-2022 upon evaluation today patient appears to be making progress. In regard to his leg ulcer.  This is slow but nonetheless the Apligraf has fed things up. Fortunately I do not see any evidence of infection locally nor systemically at this time. No fevers, chills, nausea, vomiting, or diarrhea. Patient is here for Apligraf #4 today. 04-13-2022 upon evaluation today patient appears to be doing a little bit more poorly in regard to his wound. He actually feels like his wrap may have been a little bit tight. Fortunately there does not appear to be any signs of active infection locally nor systemically which is great news and I am pleased in that regard. 04-20-2022 upon evaluation today patient appears to be doing better in regard to his wound from the standpoint of swelling he is actually lost 33 pounds on torsemide  in the past week his leg is significantly smaller compared to what it has been. Fortunately I do not see any signs of active infection locally nor systemically at this time. 04-27-2022 upon evaluation today patient's wound actually is showing signs of erythema and warmth around the edges of the wound I am actually concerned about the possibility of infection. I think we probably need to obtain a wound culture and also can recommend based on what we are seeing that we go ahead and have the patient continue to utilize the compression wrapping which I think has been of benefit. 05-04-2022 upon evaluation today patient appears to be doing well currently in regard to his wound. He has been tolerating the dressing changes without complication. Fortunately there does not appear to be any signs of active infection locally or systemically which is great news. 05-11-2022 upon evaluation today patient actually appears to be doing excellent. He has been tolerating the dressing changes we actually were trialing a new medication on him last week and this was the ergo clean Ag dressing. With that being said this actually seems to have done extremely well at this point. Fortunately I do not see any signs of  active infection locally or systemically at this time. 05-18-2022 upon evaluation today patient appears to be doing okay in regard to his wound I do not see any signs of worsening also notes any signs of significant improvement. Fortunately I do believe that removing in the right direction in general. 05-25-22 upon evaluation today patient appears to be doing well currently in regard to his wound. He has been tolerating the dressing changes without complication. Fortunately I do not see any signs of active infection locally nor systemically which is great news and overall I am extremely pleased with where we stand currently. There does not appear to be any signs of active infection locally nor systemically which is great news. No fevers, chills, nausea, vomiting, or diarrhea. 06-01-2022 upon evaluation today patient actually appears to be making excellent progress in regard to his ankle  region. I am very pleased with where we stand I think that he is doing extremely well and very pleased with endoform and drawtex combination. 06-08-2022 upon evaluation today patient appears to be doing well currently in regard to his wound. He has been making good progress and I feel like practicing improvement in the overall size of the wound. I am very pleased in that regard. Fortunately I do not see any signs of active infection locally nor systemically which is great news. No fevers, chills, nausea, vomiting, or diarrhea. 06-15-2022 upon evaluation today patient appears to be doing well currently in regard to his wound. He has been tolerating the dressing changes without complication. I do believe the endoform has been extremely helpful for him up to this point. I do not see any signs of active infection locally nor systemically which is great news and in general I do believe that we are making pretty good progress here which is excellent news. 06-22-2022 upon evaluation today patient appears to be doing well currently in  regard to his wound. He has been tolerating the dressing changes without complication. Fortunately there does not appear to be any signs of active infection at this time which is great news. I think that he is actually taking good progress with the endoform which is excellent as well. 06-29-2022 upon evaluation patient appears to be doing well currently in regard to his wound. He is actually making good progress towards closure and I am very pleased in that regard. I do not see any evidence of active infection locally nor systemically which is great news and in general I do believe that we are making excellent progress here overall. 07-06-2022 upon evaluation today patient appears to be doing well currently in regard to his wound. He has been tolerating the dressing changes without complication. The good news is he is slowly getting smaller each time I see him. 07-13-2022 upon evaluation today patient appears to be doing well currently in regard to his wound is slowly showing signs of improvement. Fortunately I do not see any signs of infection at this time. No fevers, chills, nausea, vomiting, or diarrhea. 07-20-2022 upon evaluation today patient appears to be doing well currently in regard to his wound. He has been tolerating the dressing changes without [name], [name] ([id number]) [id number] Page 10 of 12 complication. Fortunately I do not see any evidence of active infection locally or systemically which is great news and in general I do believe that we are making headway towards complete closure. This is excellent news. With that being said we are preparing for him to be leaving to go out of town in about a week and a half. For that reason we will going to make a few changes to his dressing today in order to give him something that I think will be easier for him to do when he is out of town. 07-27-2022 upon evaluation today patient appears to be doing well currently in  regard to his wound. He has been tolerating the dressing changes without complication. Fortunately there does not appear to be any signs of infection. Fortunately there does not appear to be any signs of infection 08-10-2022 upon evaluation today patient appears to be doing okay in regard to his wound. He actually has been gone for 2 weeks and in the interim unfortunately he did not have result out of the Tegaderm as well as hoping for but nonetheless took good care of his wound. Does not appear to  be infected I do think we need to go back to the compression wrap however which I think would definitely benefit him. Fortunately I do not see any signs of active infection at this time which is great news. 08-17-2022 upon evaluation today patient unfortunately appears to be showing signs of infection based on what I see at this time. Fortunately I do not see any evidence of systemic infection though locally I do definitely see some changes that are concerning. I do not think that this is systemic at all. 08-24-2022 upon evaluation today patient appears to be doing well currently in regard to his wound. He has been tolerating the dressing changes this looks significantly improved compared to last week and very pleased. I do not see any signs of active infection locally or systemically at this time. 08-31-2022 upon evaluation today patient appears to be doing well currently in regard to his wound this is actually showing signs of improvement and actually very pleased with where we stand today. Fortunately I do not see any signs of active infection which is great news. 09-14-2022 upon evaluation today patient appears to be doing well current regard to his wound with section showing signs of improvement would be using endoform which I think is really doing a great job here. Fortunately I do not see any signs of worsening overall. 09-21-2022 upon evaluation today patient appears to be doing well currently in regard to  his wound. He has been tolerating the dressing changes without complication. Fortunately there does not appear to be any signs of active infection at this time which is good news. 09-28-2022 upon evaluation today patient appears to be doing well currently in regard to his wound which is actually measuring quite a bit smaller. This looks to be doing well and very pleased with where things stand today I do not see any signs of active infection locally or systemically which is great news. No fevers, chills, nausea, vomiting, or diarrhea. 10-05-2022 upon evaluation today patient appears to be doing excellent in regard to his wounds. He has been tolerating the dressing changes without complication and in general I do believe that we will make an really good headway towards complete closure. 10/12/2022 upon evaluation today patient's wound is actually showing signs of excellent improvement. I am actually very pleased with where we stand I think that he is moving in the right direction and this is excellent news. I do not see any evidence of worsening overall 10-19-2022 upon evaluation today patient appears to be doing well currently in regard to his wound. He is showing signs of improvement and very pleased with where we stand I think that is making good progress here. 10-26-2022 upon evaluation today patient appears to be doing well currently in regard to his wound. In fact this is showing signs of excellent improvement I am actually very pleased with where we stand I do believe that he is making excellent headway towards complete closure which is great news as well. No fevers, chills, nausea, vomiting, or diarrhea. 11-02-2022 upon evaluation today patient appears to be doing great in regard to his wound although he does have an area at the top of the wrap which sometimes gets moist which to be honest he actually appears to have a little bit of a fungal infection at this point. I think that we may need to  treat this he can get to it and put some cream in there he actually does that on his own UA with  some CeraVe. 11-09-2022 upon evaluation today patient's wound bed actually showed signs of good granulation epithelization at this point. Fortunately I do not see any signs of worsening overall and I do believe that the patient is making good headway towards closure. Patient's wound unfortunately at the top of the wrap appears to be still causing him problems despite the cream that we have been using over the past week. We are going to try an oral antibiotic at this time. 11/20; patient has a longstanding wound on the right medial ankle. He has very healthy looking granulation today and the wound is measuring smaller. We have been using gent and endoform. There is seems little reason to change this 12/18; longstanding wound on the right medial ankle. Wound is measuring smaller healthy granulation we have been using endoform as the primary dressing. Under illumination today probably 70 to 80% epithelialization 01/20/2023; patient has a wound to the right medial ankle and we have been using endoform with gentamicin  under 4-layer compression. Patient has no issues or complaints today. He denies signs of infection. Objective Constitutional respirations regular, non-labored and within target range for patient.. Vitals Time Taken: 4:11 PM, Height: 71 in, Weight: 350 lbs, BMI: 48.8. Cardiovascular 2+ dorsalis pedis/posterior tibialis pulses. Psychiatric pleasant and cooperative. General Notes: T the right medial ankle there is an open wound with granulation tissue throughout. No signs of surrounding infection. Good edema control. o Integumentary (Hair, Skin) Wound #7 status is Open. Original cause of wound was Blister. The date acquired was: 07/21/2021. The wound has been in treatment 76 weeks. The wound is located on the Right,Medial Malleolus. The wound measures 2cm length x 0.5cm width x 0.2cm depth;  0.785cm^2 area and 0.157cm^3 volume. There is Fat Layer (Subcutaneous Tissue) exposed. There is no tunneling or undermining noted. There is a medium amount of serosanguineous drainage noted. The wound margin is distinct with the outline attached to the wound base. There is large (67-100%) red granulation within the wound bed. There is no necrotic tissue within [name], [name] ([id number]) [id number] Page 11 of 12 the wound bed. The periwound skin appearance exhibited: Hemosiderin Staining. The periwound skin appearance did not exhibit: Callus, Crepitus, Excoriation, Induration, Rash, Scarring, Dry/Scaly, Maceration, Atrophie Blanche, Cyanosis, Ecchymosis, Mottled, Pallor, Rubor, Erythema. Periwound temperature was noted as No Abnormality. 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 I recommended continuing the course with gentamicin  ointment and endoform under compression therapy. Follow-up in 1 week. Procedures Wound #7 Pre-procedure diagnosis of Wound #7 is a Venous Leg Ulcer located on the Right,Medial Malleolus . There was a Double Layer Compression Therapy Procedure by Drury Nestle, RN. Post procedure Diagnosis Wound #7: Same as Pre-Procedure Plan Follow-up Appointments: Return Appointment in 1 week. - Dr. Rosan Return Appointment in 2 weeks. - Dr. Rosan Return appointment in 3 weeks. - Dr. Rosan Anesthetic: (In clinic) Topical Lidocaine  4% applied to wound bed Cellular or Tissue Based Products: Cellular or Tissue Based Product Type: - Run IVR for Kerrecis- denied #1 Apligraf applied 02/23/2022 #2 Apligraf applied 03/02/2022 #3 Apligraf applied 03/16/2022 #4 Apligraf applied 03/30/2022 HOLD  APLIGRAF this week. Cellular or Tissue Based Product applied to wound bed, secured with steri-strips, cover with Adaptic or Mepitel. (DO NOT REMOVE). Bathing/ Shower/ Hygiene: May shower with protection but do not get  wound dressing(s) wet. Protect dressing(s) with water  repellant cover (for example, large plastic bag) or a cast cover and may then take shower. Edema Control - Orders / Instructions: Elevate legs to the level of the heart or above for 30 minutes daily and/or when sitting for 3-4 times a day throughout the day. Avoid standing for long periods of time. Patient to wear own compression stockings every day. - left leg Exercise regularly Moisturize legs daily. Compression stocking or Garment 30-40 mm/Hg pressure to: Non Wound Condition: Apply the following to affected area as directed: - ketoconazole  WOUND #7: - Malleolus Wound Laterality: Right, Medial Cleanser: Soap and Water  1 x Per Week/30 Days Discharge Instructions: May shower and wash wound with dial antibacterial soap and water  prior to dressing change. Cleanser: Vashe 5.8 (oz) 1 x Per Week/30 Days Discharge Instructions: Cleanse the wound with Vashe prior to applying a clean dressing using gauze sponges, not tissue or cotton balls. Peri-Wound Care: Sween Lotion (Moisturizing lotion) 1 x Per Week/30 Days Discharge Instructions: Apply moisturizing lotion as directed Topical: Gentamicin  1 x Per Week/30 Days Discharge Instructions: As directed by physician Prim Dressing: Endoform 2x2 in (Dispense As Written) 1 x Per Week/30 Days ary Discharge Instructions: Moisten with saline Secondary Dressing: Zetuvit Plus 4x8 in 1 x Per Week/30 Days Discharge Instructions: Apply over primary dressing as directed. Com pression Wrap: Urgo K2, (equivalent to a 4 layer) two layer compression system, regular 1 x Per Week/30 Days Discharge Instructions: Apply Urgo K2 as directed (alternative to 4 layer compression). 1. Endoform and gentamicin   under compression therapy 2. Follow-up in 1 week Electronic Signature(s) Signed: 01/20/2023 11:00:26 AM By: Rosan Harlene ROSALEA [name] ([id number]) [id number] Page 12 of 12 Signed: 01/20/2023 11:00:26 AM By: Rosan Harlene DO Entered By: Rosan Harlene on 01/20/2023 07:50:21 -------------------------------------------------------------------------------- SuperBill Details Patient Name: Date of Service: [name], [name]. 01/18/2023 Medical Record Number: [id number] Patient Account Number: [id number] Date of Birth/Sex: Treating RN: 09/04/1955 (68 y.o. NETTY Pontes, Geminus.gell Primary Care Provider: Loreli Fallow Other Clinician: Referring Provider: Treating Provider/Extender: Rosan Harlene Loreli Fallow Devra in Treatment: 76 Diagnosis Coding ICD-10 Codes Code Description [phone] Non-pressure chronic ulcer of other part of right foot with other specified severity E11.621 Type 2 diabetes mellitus with foot ulcer I87.311 Chronic venous hypertension (idiopathic) with ulcer of right lower extremity I89.0 Lymphedema, not elsewhere classified Facility Procedures : CPT4 Code: [id number] Description: (Facility Use Only) [phone] - APPLY MULTLAY COMPRS LWR RT LEG Modifier: Quantity: 1 Physician Procedures : CPT4 Code Description Modifier [id number] - WC PHYS LEVEL 3 - EST PT ICD-10 Diagnosis Description L97.518 Non-pressure chronic ulcer of other part of right foot with other specified severity E11.621 Type 2 diabetes mellitus with foot ulcer I87.311  Chronic venous hypertension (idiopathic) with ulcer of right lower extremity I89.0 Lymphedema, not elsewhere classified Quantity: 1 Electronic Signature(s) Signed: 01/20/2023 11:00:26 AM By: Rosan Harlene DO Previous Signature: 01/18/2023 5:26:35 PM Version By: Rosan Harlene DO Previous Signature: 01/18/2023 5:28:22 PM Version By: Pontes Nestle RN, BSN Entered By: Rosan Harlene on 01/20/2023 07:50:43

## 2023-01-23 NOTE — Progress Notes (Signed)
-  ADVANCED HF CLINIC NOTE   PCP: [name], MD HF Cardiologist: Dr. Gala Romney  HPI: [name] is a 68 y.o.y/o male w/ h/o chronic systolic heart failure, chronic afib, OSA, HTN, HLD, type 2DM, OSA on CPAP ,ILD, tobacco use and venous stasis ulcers, followed by wound clinic. Also history of chemotherapy with potential cardiotoxic effects (R-CHOP for lymphoma in 2011).    Admitted in 02/2017 for acute hypoxic respiratory failure requiring intubation and ultimately tracheostomy. This was in the setting of multifocal PNA/ influenza and new onset atrial fibrillation w/ RVR. Echo showed mod LVH and severely reduced LVEF 25-30% w/ diffuse HK. RV normal. At the time, his CM was felt to be viral. Did not get cath. Per d/c summary, DCCV not pursued. Afib treated w/ rate control and Eliquis.    Repeat echo 07/2017 EF improved to 50-55%, RV mildly dilated w/ mildly reduced systolic function. He was noted to be in afib at the time of study.    08/2017, wore Zio that showed Afib, well rated controlled.    Echo 11/2018 EF 45-50%, RV mildly reduced    Unfortunately, lost to f/u by cardiology for a period of time. Had been seeing [facility] for PAD, s/p left transmetatarsal amputation.  Echo 8/23 showed EF 30-35%, severe LVH, moderately reduced RV systolic function, moderate TVR.    Admitted 8/23 with pre-syncope, ECG showed AF w/ SVR, HR 28 BPM, RBBB, had several 2 sec pauses. Beta blocker and other GDMT held with AKI and hypotension. He developed worsening periods of high-grade AV block/atrial fibrillation with slow ventricular response and lengthy pauses w/ hypotension. Underwent TVP with improvement in symptoms. R/LHC showed diffuse nonobstructive CAD, bi-V failure R>L, mild to moderate pulmonary venous hypertension. EP consulted and he underwent BiV ICD. Hospitalization complicated by chronic venous stasis wounds. Able to transition to GDMT as AKI resolved. Discharged home with [facility] PT/OT, weight 328  lbs.  Echo 01/04/22: EF 30% RV moderately down   Follow up 04/11/22, massive volume overload, with NYHA III symptoms. Only taking torsemide on weekends. Restarted torsemide 40 bid x 3 days, then down to 40 mg daily.  Today he returns for AHF follow up with his friend. Overall feeling ok. Denies palpitations, CP, dizziness, edema, or PND/Orthopnea. SOB with activity. Appetite ok. No fever or chills. Weight at home 340-343 pounds. Taking all medications. Denies ETOH, tobacco or drug use. Wears CPAP. Works as a Producer, television/film/video.   Cardiac Studies - Echo (12/23): EF 30%, RV moderately down - PYP (10/23): equivocal grade 1, H/CL = 1.06 - Echo (8/23): EF 30-35%, moderately decreased LV function, RV moderate reduced, moderate TR - R/LHC (8/23): Mid RCA lesion is 40% stenosed. RPDA lesion is 95% stenosed. Ost LM lesion is 30% stenosed. Mid LAD lesion is 30% stenosed.  2nd Diag lesion is 50% stenosed.  Mid Cx lesion is 30% stenosed. Ao = 109/58 (78), LV = 119/19, RA =  21, RV = 57/19, PA = 56/22 (36), PCW = 24 (v = 30), Fick cardiac output/index = 7.3/2.7, PVR = 1.8 WU, Ao sat = 99%, PA sat = 68%, 69%, PAPi = 2. 1. Mostly non-obstructive CAD with high-grade lesion in mid to distal RPDA 2. Nonischemic CM 3. Mild to moderate pulmonary venous HTN with evidence of RV dysfunction. Continue medical therapy. If patient develops angina can consider PCI of PDA; otherwise medical therapy.   Past Medical History:  Diagnosis Date   Acute systolic HF (heart failure) (HCC)  Arthritis    lt foot, hips knees    Atrial fibrillation (HCC)    Diabetes mellitus, new onset (HCC)    Diverticulosis    Dyslipidemia    Gout attack 01/16/2012   Hypertension    Internal hemorrhoids    Lymphoma (HCC)    remission for about 2 years, chemo 3 years prior   Pneumonia due to COVID-19 virus 07/2018   Pulmonary hypertension (HCC)    Sepsis (HCC) 02/2017   secondary to influenza; requiring trach   Tubular adenoma of colon    Venous  stasis ulcers (HCC)     Current Outpatient Medications  Medication Sig Dispense Refill   acetaminophen (TYLENOL) 325 MG tablet Take 650 mg by mouth every 6 (six) hours as needed for moderate pain.      apixaban (ELIQUIS) 5 MG TABS tablet Take 1 tablet (5 mg total) by mouth 2 (two) times daily. NEEDS FOLLOW UP APPOINTMENT 180 tablet 1   ascorbic acid (VITAMIN C) 500 MG tablet Take 1 tablet (500 mg total) by mouth 3 (three) times daily. (Patient taking differently: Take 500 mg by mouth 2 (two) times daily.) 30 tablet 0   atorvastatin (LIPITOR) 40 MG tablet TAKE 1 TABLET BY MOUTH EVERY DAY 30 tablet 11   carvedilol (COREG) 3.125 MG tablet Take 1 tablet (3.125 mg total) by mouth 2 (two) times daily. NEEDS FOLLOW UP APPOINTMENT FOR MORE REFILLS 180 tablet 2   cholecalciferol (VITAMIN D) 25 MCG (1000 UNIT) tablet Take 1 tablet (1,000 Units total) by mouth daily. 30 tablet 0   colchicine 0.6 MG tablet Take 0.6 mg by mouth 2 (two) times daily as needed.     [facility] ASPIRIN ADULT LOW DOSE 81 MG chewable tablet CHEW 1 TABLET BY MOUTH DAILY. 108 tablet 3   dapagliflozin propanediol (FARXIGA) 10 MG TABS tablet Take 1 tablet (10 mg total) by mouth daily. 90 tablet 1   Omega 3 340 MG CPDR Take 1 capsule (340 mg total) by mouth 2 (two) times daily. (Patient taking differently: Take 340 mg by mouth daily.) 60 capsule 0   omeprazole (PRILOSEC) 40 MG capsule Take 1 capsule (40 mg total) by mouth daily. 30 capsule 0   potassium chloride SA (KLOR-CON M) 10 MEQ tablet Take 1 tablet (10 mEq total) by mouth daily. (Patient taking differently: Take 20 mEq by mouth daily.) 30 tablet 6   sacubitril-valsartan (ENTRESTO) 24-26 MG Take 1 tablet by mouth 2 (two) times daily. 60 tablet 3   sertraline (ZOLOFT) 50 MG tablet Take 1 tablet (50 mg total) by mouth daily. 30 tablet 0   spironolactone (ALDACTONE) 25 MG tablet Take 1 tablet (25 mg total) by mouth daily. 30 tablet 11   vitamin B-12 (CYANOCOBALAMIN) 1000 MCG tablet Take 1  tablet (1,000 mcg total) by mouth daily. 30 tablet 0   torsemide (DEMADEX) 20 MG tablet Patient is taking 2 tablets by mouth daily.     No current facility-administered medications for this encounter.   Allergies  Allergen Reactions   Latex Hives and Swelling   Ace Inhibitors     Other reaction(s): cough   Penicillin G     Other reaction(s): rash Has tolerated amoxicillin   Social History   Socioeconomic History   Marital status: Divorced    Spouse name: Not on file   Number of children: Not on file   Years of education: Not on file   Highest education level: Not on file  Occupational History   Occupation:  cosmetologist  Tobacco Use   Smoking status: Former    Current packs/day: 0.00    Average packs/day: 0.5 packs/day for 20.0 years (10.0 ttl pk-yrs)    Types: Cigarettes    Start date: 01/10/1989    Quit date: 01/10/2009    Years since quitting: 14.0   Smokeless tobacco: Never  Vaping Use   Vaping status: Never Used  Substance and Sexual Activity   Alcohol use: Not Currently    Comment: 2-3 times per week   Drug use: No   Sexual activity: Not on file  Other Topics Concern   Not on file  Social History Narrative   Not on file   Social Drivers of Health   Financial Resource Strain: Not on file  Food Insecurity: No Food Insecurity (09/20/2021)   Hunger Vital Sign    Worried About Running Out of Food in the Last Year: Never true    Ran Out of Food in the Last Year: Never true  Transportation Needs: No Transportation Needs (09/20/2021)   PRAPARE - Administrator, Civil Service (Medical): No    Lack of Transportation (Non-Medical): No  Physical Activity: Not on file  Stress: Not on file  Social Connections: Not on file  Intimate Partner Violence: Not on file   Family History  Problem Relation Age of Onset   Dementia Brother 25       frontal lobe    Dementia Mother    Arthritis Mother    Hypertension Mother    CVA Sister 27   Colon cancer Neg Hx     Pancreatic cancer Neg Hx    Stomach cancer Neg Hx    Rectal cancer Neg Hx    Liver cancer Neg Hx    BP 104/72   Pulse 80   Wt (!) 156.6 kg (345 lb 3.2 oz)   SpO2 98%   BMI 48.15 kg/m   Wt Readings from Last 3 Encounters:  01/30/23 (!) 156.6 kg (345 lb 3.2 oz)  12/12/22 (!) 155.6 kg (343 lb)  09/26/22 (!) 151.1 kg (333 lb 3.2 oz)   PHYSICAL EXAM: General:  well appearing.  No respiratory difficulty.  HEENT: normal Neck: supple. JVD difficult to see. Carotids 2+ bilat; no bruits. No lymphadenopathy or thyromegaly appreciated. Cor: PMI nondisplaced. Regular rate & rhythm. No rubs, gallops or murmurs. Lungs: clear Abdomen: soft, nontender, nondistended. No hepatosplenomegaly. No bruits or masses. Good bowel sounds. Extremities: no cyanosis, clubbing, rash, edema  Neuro: alert & oriented x 3, cranial nerves grossly intact. moves all 4 extremities w/o difficulty. Affect pleasant.   MDT device interrogation (personally reviewed): OptiVol down, thoracic impedence above threshold indicating low volume, 98.1% BiV pacing, no VT/AF, 1.2 hr/day activity.   ASSESSMENT & PLAN: Chronic Systolic Biventricular Heart Failure - Echo (2/19): EF 25-30% w/ diffuse HK. RV normal. Felt to be viral CM +/- component of tachymediated in setting of influenza PNA and rapid Afib  - Echo (7/19): EF recovered, 50-55%, RV mildly reduced - Echo (11/20): EF 45-50%, RV mildly reduced (in rate controlled afib)  - Echo (8/23): EF 30-35%, severe LVH, moderately reduced RV systolic function, moderate TR - R/LHC (8/23): diffuse non-obstructive CAD with 90% lesion in dLAD, BiV failure R>L: RA 21 PA 56/22 (36) PCW 24 Fick 7.3/2.7 PaPi 2.1 - Myeloma panel and urine immunofixation negative.  - PYP scan 10/23 equivocal. Resent Genetic testing. - s/p CRT-D  - Echo 12/23: EF 30% RV moderately down  - Stable NYHA II-early  III, confounded by body habitus and LE wounds. - Volume appears stable. OptiVol stable - Continue  torsemide 40 mg daily - Restart Entresto at 24-26 mg BID (previously on 97/103 but has been out for 2 months. SBP low 100s today). BMET/BNP today, repeat in 7-10 days - Continue spironolactone 25 mg daily. - Continue Farxiga 10 mg daily.  - Continue carvedilol 3.125 mg bid    2. Profound Bradycardia w/ High-grade AV block - s/p BiV ICD. - Interrogation as above.   3. Chronic Afib - Dates back to at least 2019, has been historically well rate controlled. - Continue CPAP.  - Continue Eliquis. No bleeding issues.   4. PAD/ Chronic Venous Stasis Wounds  - s/p left transmetatarsal amputation - Followed by [facility] and Wound clinic  5. H/o Lymphoma - s/p R-CHOP in 2011 (potential cardiotoxic effects)    6. Type 2DM - Hgb A1c 6.0 - On SGLT2i    7. CAD - LHC (8/23) diffuse mostly non-obstructive CAD, not cause of CM. - No s/s angina - If has angina, can consider PCI PDA - Continue medical management with ASA/statin.   8. Morbid obesity - Body mass index is 48.15 kg/m. - Needs weight loss. - Initially on Mounjaro then insurance wouldn't cover it so he switched to Ozempic. Did not tolerate Ozempic side effects. Plan to start [facility] again this Saturday.    9. ILD - Hi rest chest CT nonspecific, but ? NSIP vs chronic hypersensitivity pneumonitis - Followed by Pulmonary.   Follow up in 3 months with Dr. Gala Romney.   Alen Bleacher, NP  3:28 PM

## 2023-01-25 ENCOUNTER — Encounter (HOSPITAL_BASED_OUTPATIENT_CLINIC_OR_DEPARTMENT_OTHER): Payer: Medicare Other | Admitting: Internal Medicine

## 2023-01-25 DIAGNOSIS — E11621 Type 2 diabetes mellitus with foot ulcer: Secondary | ICD-10-CM

## 2023-01-25 DIAGNOSIS — I890 Lymphedema, not elsewhere classified: Secondary | ICD-10-CM | POA: Diagnosis not present

## 2023-01-25 DIAGNOSIS — I110 Hypertensive heart disease with heart failure: Secondary | ICD-10-CM | POA: Diagnosis not present

## 2023-01-25 DIAGNOSIS — I509 Heart failure, unspecified: Secondary | ICD-10-CM | POA: Diagnosis not present

## 2023-01-25 DIAGNOSIS — L97518 Non-pressure chronic ulcer of other part of right foot with other specified severity: Secondary | ICD-10-CM | POA: Diagnosis not present

## 2023-01-25 DIAGNOSIS — Z7901 Long term (current) use of anticoagulants: Secondary | ICD-10-CM | POA: Diagnosis not present

## 2023-01-25 DIAGNOSIS — I2510 Atherosclerotic heart disease of native coronary artery without angina pectoris: Secondary | ICD-10-CM | POA: Diagnosis not present

## 2023-01-25 DIAGNOSIS — E1151 Type 2 diabetes mellitus with diabetic peripheral angiopathy without gangrene: Secondary | ICD-10-CM | POA: Diagnosis not present

## 2023-01-25 DIAGNOSIS — I87311 Chronic venous hypertension (idiopathic) with ulcer of right lower extremity: Secondary | ICD-10-CM

## 2023-01-25 DIAGNOSIS — Z6841 Body Mass Index (BMI) 40.0 and over, adult: Secondary | ICD-10-CM | POA: Diagnosis not present

## 2023-01-25 NOTE — Progress Notes (Signed)
 [name], [name] ([id number]) [id number] Page 1 of 8 Visit Report for 01/18/2023 Arrival Information Details Patient Name: Date of Service: [name], [name] 01/18/2023 3:00 PM Medical Record Number: [id number] Patient Account Number: [id number] Date of Birth/Sex: Treating RN: [birth year] (68 y.o. [name], Geminus.gell Primary Care [name]: [name] Other Clinician: Referring [name]: Treating [name]/Extender: [name]: 18 Visit Information History Since Last Visit Added or deleted any medications: No Patient Arrived: [name] Any new allergies or adverse reactions: No Arrival Time: 16:10 Had a fall or experienced change in No Accompanied By: self activities of daily living that may affect Transfer Assistance: None risk of falls: Patient Identification Verified: Yes Signs or symptoms of abuse/neglect since last visito No Secondary Verification Process Completed: Yes Hospitalized since last visit: No Patient Requires Transmission-Based Precautions: No Implantable device outside of the clinic excluding No Patient Has Alerts: Yes cellular tissue based products placed in the center Patient Alerts: Patient on Blood Thinner since last visit: 01/2021 ABI L 1.13 R 1.06 Has Dressing in Place as Prescribed: Yes 01/2021 TBI L amp R 0.8 Has Compression in Place as Prescribed: Yes Pain Present Now: No Electronic Signature(s) Signed: 01/18/2023 5:28:22 PM By: [name] Nestle RN, BSN Entered By: [name] on 01/18/2023 16:10:27 -------------------------------------------------------------------------------- Compression Therapy Details Patient Name: Date of Service: [name], [name] 01/18/2023 3:00 PM Medical Record Number: [id number] Patient Account Number: [id number] Date of Birth/Sex: Treating RN: 04-25-1955 (68 y.o. [name] [name] Primary Care [name]: [name] Other Clinician: Referring [name]: Treating  [name]/Extender: [name]: 76 Compression Therapy Performed for Wound Assessment: Wound #7 Right,Medial Malleolus Performed By: Clinician [name] Nestle, RN Compression Type: Double Layer Post Procedure Diagnosis Same as Pre-procedure Electronic Signature(s) Signed: 01/18/2023 5:28:22 PM By: [name] Nestle RN, BSN Entered By: [name] on 01/18/2023 16:17:55 [name] ([id number]) [id number] Page 2 of 8 -------------------------------------------------------------------------------- Encounter Discharge Information Details Patient Name: Date of Service: [name], [name] 01/18/2023 3:00 PM Medical Record Number: [id number] Patient Account Number: [id number] Date of Birth/Sex: Treating RN: 01/19/1955 (68 y.o. [name] [name] Primary Care [name]: [name] Other Clinician: Referring [name]: Treating [name]/Extender: [name]: 36 Encounter Discharge Information Items Discharge Condition: Stable Ambulatory Status: Walker Discharge Destination: Home Transportation: Private Auto Accompanied By: self Schedule Follow-up Appointment: Yes Clinical Summary of Care: Electronic Signature(s) Signed: 01/18/2023 5:28:22 PM By: [name] Nestle RN, BSN Entered By: [name] on 01/18/2023 16:23:21 -------------------------------------------------------------------------------- Lower Extremity Assessment Details Patient Name: Date of Service: [name], [name] 01/18/2023 3:00 PM Medical Record Number: [id number] Patient Account Number: [id number] Date of Birth/Sex: Treating RN: 1955/03/20 (68 y.o. [name] [name] Primary Care Kayela Humphres: [name] Other Clinician: Referring Betzy Barbier: Treating Hiba Garry/Extender: [name]: 76 Edema Assessment Assessed: [name]: No] [name]: Yes] Edema: [Left: N] [Right: o] Calf Left: Right: Point of  Measurement: 31 cm From Medial Instep 40.5 cm Ankle Left: Right: Point of Measurement: 11 cm From Medial Instep 25.5 cm Vascular Assessment Pulses: Dorsalis Pedis Palpable: [Right:Yes] Extremity colors, hair growth, and conditions: Extremity Color: [Right:Normal] Hair Growth on Extremity: [Right:No] Temperature of Extremity: [Right:Warm] Capillary Refill: [Right:< 3 seconds] Dependent Rubor: [Right:No Yes] Electronic Signature(s) Signed: 01/18/2023 5:28:22 PM By: [name] Nestle RN, BSN Entered By: [name] on 01/18/2023 16:04:46 [name], [name] ([id number]) [id number] Page 3 of 8 -------------------------------------------------------------------------------- Multi Wound Chart Details Patient Name: Date of Service: [name], [name] 01/18/2023 3:00 PM  Medical Record Number: [id number] Patient Account Number: [id number] Date of Birth/Sex: Treating RN: April 30, 1955 (68 y.o. M) Primary Care Niya Behler: [name] Other Clinician: Referring Prateek Knipple: Treating [name]/Extender: [name]: 65 [7:Photos:] [N/A:N/A] Right, Medial Malleolus N/A N/A Wound Location: Blister N/A N/A Wounding Event: Venous Leg Ulcer N/A N/A Primary Etiology: Lymphedema N/A N/A Secondary Etiology: Sleep Apnea, Arrhythmia, Congestive N/A N/A Comorbid History: Heart Failure, Hypertension, Hypotension, Peripheral Arterial Disease, Peripheral Venous Disease, Type II Diabetes, Gout, Osteoarthritis 07/21/2021 N/A N/A Date Acquired: 10 N/A N/A Weeks of [name]: Open N/A N/A Wound Status: No N/A N/A Wound Recurrence: 2x0.5x0.2 N/A N/A Measurements L x W x [name] 0.785 N/A N/A A (cm) : rea 0.157 N/A N/A Volume (cm) : 97.90% N/A N/A % Reduction in Area: 99.40% N/A N/A % Reduction in Volume: Full Thickness With Exposed Support N/A N/A Classification: Structures Medium N/A N/A Exudate Amount: Serosanguineous N/A N/A Exudate  Type: red, brown N/A N/A Exudate Color: Distinct, outline attached N/A N/A Wound Margin: Large (67-100%) N/A N/A Granulation Amount: Red N/A N/A Granulation Quality: None Present (0%) N/A N/A Necrotic Amount: Fat Layer (Subcutaneous Tissue): Yes N/A N/A Exposed Structures: Fascia: No Tendon: No Muscle: No Joint: No Bone: No Large (67-100%) N/A N/A Epithelialization: Excoriation: No N/A N/A Periwound Skin Texture: Induration: No Callus: No Crepitus: No Rash: No Scarring: No Maceration: No N/A N/A Periwound Skin Moisture: Dry/Scaly: No Hemosiderin Staining: Yes N/A N/A Periwound Skin Color: Atrophie Blanche: No Cyanosis: No Ecchymosis: No Erythema: No Mottled: No Pallor: No Rubor: No No Abnormality N/A N/A Temperature: Compression Therapy N/A N/A Procedures Performed: [name] Notes Wound #7 (Malleolus) Wound Laterality: Right, Medial [place], SALOMON [name] ([id number]) [id number] Page 4 of 8 Cleanser Soap and Water  Discharge Instruction: May shower and wash wound with dial antibacterial soap and water  prior to dressing change. Vashe 5.8 (oz) Discharge Instruction: Cleanse the wound with Vashe prior to applying a clean dressing using gauze sponges, not tissue or cotton balls. Peri-Wound Care Sween Lotion (Moisturizing lotion) Discharge Instruction: Apply moisturizing lotion as directed Topical Gentamicin  Discharge Instruction: As directed by physician Primary Dressing Endoform 2x2 in Discharge Instruction: Moisten with saline Secondary Dressing Zetuvit Plus 4x8 in Discharge Instruction: Apply over primary dressing as directed. Secured With Compression Wrap Urgo K2, (equivalent to a 4 layer) two layer compression system, regular Discharge Instruction: Apply Urgo K2 as directed (alternative to 4 layer compression). Compression Stockings Add-Ons Electronic Signature(s) Signed: 01/20/2023 11:00:26 AM By: [name] Harlene DO Entered By:  [name] on 01/20/2023 10:46:45 -------------------------------------------------------------------------------- Multi-Disciplinary Care Plan Details Patient Name: Date of Service: [name], [name] 01/18/2023 3:00 PM Medical Record Number: [id number] Patient Account Number: [id number] Date of Birth/Sex: Treating RN: 1955-02-27 (68 y.o. [name] Drury [name] Primary Care Emmanuell Kantz: [name] Other Clinician: Referring Aleksandar Duve: Treating Jin Capote/Extender: [name]: 78 Multidisciplinary Care Plan reviewed with physician Active Inactive Pain, Acute or Chronic Nursing Diagnoses: Pain, acute or chronic: actual or potential Potential alteration in comfort, pain Goals: Patient will verbalize adequate pain control and receive pain control interventions during procedures as needed Date Initiated: 08/04/2021 Target Resolution Date: 02/10/2023 Goal Status: Active Patient/caregiver will verbalize comfort level met Date Initiated: 08/04/2021 Date Inactivated: 07/27/2022 Target Resolution Date: 09/10/2022 Goal Status: Met Interventions: CHRIST, FULLENWIDER [name] ([id number]) [phone].pdf Page 5 of 8 Encourage patient to take pain medications as prescribed Provide education on pain management Reposition patient for comfort [name] Activities: Administer pain control measures as ordered : 08/04/2021 Notes: Electronic  Signature(s) Signed: 01/18/2023 5:28:22 PM By: Drury Nestle RN, BSN Entered By: Drury [name] on 01/18/2023 16:22:33 -------------------------------------------------------------------------------- Pain Assessment Details Patient Name: Date of Service: [name], [name] 01/18/2023 3:00 PM Medical Record Number: [id number] Patient Account Number: [id number] Date of Birth/Sex: Treating RN: [birth year] (68 y.o. [name] Drury [name] Primary Care Alfred Eckley: [name] Other Clinician: Referring Shaundra Fullam: Treating Sharmayne Jablon/Extender:  [name]: 73 Active Problems Location of Pain Severity and Description of Pain Patient Has Paino No Site Locations Pain Management and Medication Current Pain Management: Electronic Signature(s) Signed: 01/18/2023 5:28:22 PM By: Drury Nestle RN, BSN Entered By: Drury [name] on 01/18/2023 16:12:14 -------------------------------------------------------------------------------- Patient/Caregiver Education Details Patient Name: Date of Service: [name], [name] 1/8/2025andnbsp3:00 PM Medical Record Number: [id number] Patient Account Number: [id number] Date of Birth/Gender: Treating RN: July 23, 1955 (68 y.o. [name] Drury [name] [place], SALOMON [name] ([id number]) [phone].pdf Page 6 of 8 Primary Care Physician: [name] Other Clinician: Referring Physician: Treating Physician/Extender: [name]: 43 Education Assessment Education Provided To: Patient Education Topics Provided Pain: Handouts: A Guide to Pain Control Methods: Explain/Verbal Responses: Reinforcements needed Electronic Signature(s) Signed: 01/18/2023 5:28:22 PM By: Drury Nestle RN, BSN Entered By: Drury [name] on 01/18/2023 16:22:44 -------------------------------------------------------------------------------- Wound Assessment Details Patient Name: Date of Service: [name], [name] 01/18/2023 3:00 PM Medical Record Number: [id number] Patient Account Number: [id number] Date of Birth/Sex: Treating RN: [birth year] (68 y.o. [name] Drury, Geminus.gell Primary Care Eunice Oldaker: [name] Other Clinician: Referring Dominique Calvey: Treating Kristee Angus/Extender: [name]: 76 Wound Status Wound Number: 7 Primary Venous Leg Ulcer Etiology: Wound Location: Right, Medial Malleolus Secondary Lymphedema Wounding Event: Blister Etiology: Date Acquired: 07/21/2021 Wound Open Weeks Of [name]:  76 Status: Clustered Wound: No Comorbid Sleep Apnea, Arrhythmia, Congestive Heart Failure, Hypertension, History: Hypotension, Peripheral Arterial Disease, Peripheral Venous Disease, Type II Diabetes, Gout, Osteoarthritis Photos Wound Measurements Length: (cm) 2 Width: (cm) 0.5 Depth: (cm) 0.2 Area: (cm) 0.785 Volume: (cm) 0.157 % Reduction in Area: 97.9% % Reduction in Volume: 99.4% Epithelialization: Large (67-100%) Tunneling: No Undermining: No Wound Description Classification: Full Thickness With Exposed Support Wound Margin: Distinct, outline attached Exudate Amount: Medium [name], [name] ([id number]) Exudate Type: Serosanguineous Exudate Color: red, brown Structures Foul Odor After Cleansing: No Slough/Fibrino No [phone].pdf Page 7 of 8 Wound Bed Granulation Amount: Large (67-100%) Exposed Structure Granulation Quality: Red Fascia Exposed: No Necrotic Amount: None Present (0%) Fat Layer (Subcutaneous Tissue) Exposed: Yes Tendon Exposed: No Muscle Exposed: No Joint Exposed: No Bone Exposed: No Periwound Skin Texture Texture Color No Abnormalities Noted: No No Abnormalities Noted: No Callus: No Atrophie Blanche: No Crepitus: No Cyanosis: No Excoriation: No Ecchymosis: No Induration: No Erythema: No Rash: No Hemosiderin Staining: Yes Scarring: No Mottled: No Pallor: No Moisture Rubor: No No Abnormalities Noted: No Dry / Scaly: No Temperature / Pain Maceration: No Temperature: No Abnormality [name] Notes Wound #7 (Malleolus) Wound Laterality: Right, Medial Cleanser Soap and Water  Discharge Instruction: May shower and wash wound with dial antibacterial soap and water  prior to dressing change. Vashe 5.8 (oz) Discharge Instruction: Cleanse the wound with Vashe prior to applying a clean dressing using gauze sponges, not tissue or cotton balls. Peri-Wound Care Sween Lotion (Moisturizing lotion) Discharge Instruction: Apply  moisturizing lotion as directed Topical Gentamicin  Discharge Instruction: As directed by physician Primary Dressing Endoform 2x2 in Discharge Instruction: Moisten with saline Secondary Dressing Zetuvit Plus 4x8 in Discharge Instruction: Apply over primary dressing as directed. Secured With Compression Wrap  Urgo K2, (equivalent to a 4 layer) two layer compression system, regular Discharge Instruction: Apply Urgo K2 as directed (alternative to 4 layer compression). Compression Stockings Add-Ons Electronic Signature(s) Signed: 01/18/2023 5:28:22 PM By: Drury Nestle RN, BSN Signed: 01/23/2023 5:23:50 PM By: Wyn Iha Entered By: Wyn Iha on 01/18/2023 16:09:53 Geister, Warnie [name] ([id number]) [id number] Page 8 of 8 -------------------------------------------------------------------------------- Vitals Details Patient Name: Date of Service: [name], [name] 01/18/2023 3:00 PM Medical Record Number: [id number] Patient Account Number: [id number] Date of Birth/Sex: Treating RN: June 23, 1955 (68 y.o. [name] Drury [name] Primary Care Sahvannah Rieser: [name] Other Clinician: Referring Brelee Renk: Treating Rizwan Kuyper/Extender: [name]: 76 Vital Signs Time Taken: 16:11 Reference Range: 80 - 120 mg / dl Height (in): 71 Weight (lbs): 350 Body Mass Index (BMI): 48.8 Electronic Signature(s) Signed: 01/18/2023 5:28:22 PM By: Drury Nestle RN, BSN Entered By: Drury [name] on 01/18/2023 16:12:06

## 2023-01-26 NOTE — Progress Notes (Addendum)
COURVOISIER, VREDEVELD ([id number]) [id number] Page 1 of 12 Visit Report for 01/25/2023 Chief Complaint Document Details Patient Name: Date of Service: [name] 01/25/2023 3:00 PM Medical Record Number: [id number] Patient Account Number: [id number] Date of Birth/Sex: Treating RN: [name] (68 y.o. M) Primary Care Provider: Martha [name] Other Clinician: Referring Provider: Treating Provider/Extender: [name] in Treatment: 77 Information Obtained from: Patient Chief Complaint 08/04/2021; patient returns to clinic with bilateral leg wounds as well as areas on the right foot Electronic Signature(s) Signed: 01/25/2023 4:27:59 PM By: [name] Corwin DO Entered By: [name] on 01/25/2023 15:31:56 -------------------------------------------------------------------------------- HPI Details Patient Name: Date of Service: [name], [name]. 01/25/2023 3:00 PM Medical Record Number: [id number] Patient Account Number: [id number] Date of Birth/Sex: Treating RN: [birth year] (68 y.o. M) Primary Care Provider: Martha [name] Other Clinician: Referring Provider: Treating Provider/Extender: [name] in Treatment: 63 History of Present Illness HPI Description: ADMISSION 03/22/2021 This is a 68 year old man with a past medical history significant for diabetes type 2, congestive heart failure, peripheral arterial disease, morbid obesity, venous insufficiency, and coronary artery disease. He has been followed by [name], who performed a transmetatarsal amputation on the left foot in August 2022. He had issues healing that wound, but based upon [name], ultimately the TMA wound healed. During his recovery from that surgery, however, ulcers opened up over the DIP joint of the right second and third toe. These have apparently closed and reopened multiple times. It sounds like one of the issues has  been moisture accumulation and maceration of the tissues causing them to reopen. At his last visit with [name], on March 01, 2021, there continues to be problems with moisture and he was referred to wound care for further evaluation and management. He had a formal aortogram with runoff performed prior to his TMA. The findings are copied here: Patient has inline flow to both feet with no significant flow-limiting lesion that would be amenable to percutaneous or open revascularization. He does have an element of small vessel disease and has a short segment occlusion of the distal anterior tibial/dorsalis pedis artery on the left foot but does have posterior tibial artery flow. Would recommend management of wounds with amputation of toes 2 and 3 on the right foot if the wounds do not heal and deteriorate. Transmetatarsal amputation on the left side has as good a blood supply as it is going to get and hopefully this will heal in the future. Formal ABIs were done in January 2023. They are normal bilaterally. ABI Findings: +---------+------------------+-----+----------+--------+ Right Rt Pressure (mmHg)IndexWaveform Comment  +---------+------------------+-----+----------+--------+ Brachial 160     +---------+------------------+-----+----------+--------+ PTA 192 1.06 monophasic  +---------+------------------+-----+----------+--------+ DP 159 0.88 monophasic  +---------+------------------+-----+----------+--------+ Great T oe145 0.80    +---------+------------------+-----+----------+--------+ +--------+------------------+-----+---------+-------+ [name] ([id number]) [id number] Page 2 of 12 Left Lt Pressure (mmHg)IndexWaveform Comment +--------+------------------+-----+---------+-------+ [id number]     +--------+------------------+-----+---------+-------+ PTA 204 1.13 triphasic   +--------+------------------+-----+---------+-------+ DP 194 1.07 biphasic   +--------+------------------+-----+---------+-------+ +-------+-----------+-----------+------------+------------+ ABI/TBIT oday's ABIT oday's TBIPrevious ABIPrevious TBI +-------+-----------+-----------+------------+------------+ Right 1.06 0.8 1.26 0.65  +-------+-----------+-----------+------------+------------+ Left 1.13 amputation 1.15 amputation  +-------+-----------+-----------+------------+------------+ Previous ABI on 08/06/20 at [facility] Pedal pressures falsely elevated due to medial calcification. Summary: Right: Resting right ankle-brachial index is within normal range. The right toe-brachial index is normal. Left: Resting left ankle-brachial index is within normal range. READMISSION 08/04/2021 [name] is now a 68 year old man who I remember from this clinic many years ago I think he  had a right lower extremity predominantly venous wound at the time. He was here for 1 visit in March of this year had wounds on his right second and third toes we apparently dressed them many and they healed so he did not come back. He is listed in [place] is being a diabetic although the patient denies this says he is verified it with his primary doctor. In any case over the last several weeks or so according the patient although these wounds look somewhat more chronic than that he has developed predominantly large wounds on the right medial and right lateral ankle smaller areas on the left leg and areas on the dorsal aspect of his right second and third toes. Its not clear how he has been dressing these. 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 The patient has known PAD. He had an angiogram in August 2022 at which time he had nonhealing wounds in both feet. On the left his major vessels in the thigh were all  patent. He had three-vessel patent to the level of the ankle. He had a very short occlusion in the left anterior tibial. On the right lower extremity the major vessels in his thigh were all patent. He had three-vessel runoff to the foot sluggish filling of the anterior tibial artery. He was felt to have some component of small vessel disease but nothing that was amenable or needed revascularization. It was recommended that he have amputation of the second and third toes on the right foot if they did not heal He has been following with [name]. [name] got him juxta lite stockings although I do not think he had them on properly he has uncontrolled edema in both legs Past medical history includes type 2 diabetes [although the patient really denies this], left TMA in 2022,lower extremity wounds in fact attendance at this clinic in 2009-2010, A-fib on Eliquis, chronic venous insufficiency with secondary lymphedema history of non-Hodgkin's lymphoma. 08-11-2021 upon evaluation today patient presents for follow-up evaluation he was seen last Wednesday initially for inspection here in our clinic. With that being said he tells me that he unfortunately has been having a lot of drainage and is actually coming through his wrap. Fortunately I do not see any evidence of active infection locally or systemically at this time which is great news. No fevers, chills, nausea, vomiting, or diarrhea. With that being said there does appear to be some evidence of local infection based on what I am seeing today. 08-18-2021 upon evaluation today patient appears to be doing okay currently in regard to his wounds with that being said that he is doing much better but still has a long ways to go to get to where he wanted to be. I think the infection is significantly improved. He has another week of the antibiotic at this point. 08-25-2021 upon evaluation today patient's wounds are actually doing decently well he has  erythema has significantly improved. I think the cellulitis is under controlled I am going to continue him on 2 more weeks of the Levaquin at 500 mg this is a lower dose but I am hoping it will be better for him. 09-08-2021 upon evaluation today patient appears to be doing excellent in regard to his wounds. Since I last saw him he was actually in the hospital where he ended up having a pacemaker put in. Subsequently he tells me that  he is actually doing quite well although they were unsure whether they were going to do it due to the fact that he had the wounds on his legs. And then I am glad they did anything seem to be doing well. 09-15-2021 upon evaluation patient's wounds are actually showing signs of improvement. The right side wounds do appear to have some need for sharp debridement today and I Ernie Hew go ahead and proceed with that. I think that if we get the wounds cleaned up he will actually show signs of continued improvement. I am also leaning towards switching to [name] which I think will be a much better option for him. 09-22-2021 patient's wounds are showing signs of excellent improvement. I am actually extremely pleased with where we stand and I think that the patient is making great progress. There does not appear to be any signs of active infection. 09-29-2021 upon evaluation today patient appears to be doing excellent in regard to his wounds. He is actually tolerating the dressing changes without complication. Fortunately I see no evidence of active infection locally or systemically at this time which is great news and overall I am extremely pleased with where we stand currently. [id number] upon evaluation today patient actually appears to be doing excellent in regard to his wounds. The left leg is almost completely healed the right ankle is significantly smaller. Overall I am extremely pleased with where we stand at this point. I do not see any evidence of active infection at this  time. 10-13-2021 upon evaluation today patient appears to be doing excellent in regard to his wounds. I really feel like he is making good progress here and I am very pleased in that regard. Fortunately I do not see any signs of active infection at this time. We are using the [facility] topical antibiotic therapy. 10-20-2021 upon evaluation today patient appears to be doing well with regard to his wound on the right medial ankle region the left leg is almost completely healed. I am actually very pleased with where we stand today. 11-03-2021 upon evaluation today patient's wound actually is going require some sharp debridement but appears to be doing much better which is great news. Fortunately I do not see any signs of active infection at this time. 11-10-2021 upon evaluation today patient's wound is actually showing signs of excellent improvement. Fortunately I do not see any evidence of infection locally JUMAANE, DEMERY [name] ([id number]) [id number] Page 3 of 12 or systemically which is great news and overall I am extremely pleased with where we stand today. I do believe he is making good progress he does have his [facility] topical antibiotics with him here today. 11-24-2021 upon evaluation today patient actually showing signs of excellent improvement this appears to be doing much better. Fortunately I do not see any evidence of infection locally or systemically at this time. 12-01-2021 upon evaluation today patient appears to be doing well currently in regard to his wound. He has been tolerating the dressing changes without complication. Fortunately I do not see any evidence of active infection at this time which is great news and overall I am extremely pleased with where we stand today. 12-15-2021 upon evaluation today patient appears to be doing well with regard to his wound. He is showing signs of improvement is slow but nonetheless we are making improvements here. Fortunately I  do not see any evidence of active infection locally nor systemically at this time. We are still using the [facility] topical antibiotic over the  open area only. 12-29-2021 upon evaluation today patient's wound actually is showing signs of excellent improvement. It has been 2 weeks since I perform any debridement and it definitely shows he has some tissue that needs to be cleaned away but I think we can do so quite easily and readily today. The good news is I do not see any signs of infection I think he is doing much better in that regard. Overall I am extremely pleased with where we stand. 01-12-2022 upon evaluation today patient appears to be doing well currently in regard to his wound although it is not getting significantly smaller it is also not getting any larger. Fortunately I do not see any evidence of infection locally nor systemically which is great news and overall I am extremely pleased with where things stand currently. 01-19-2022 upon evaluation today patient appears to be doing well currently in regard to his wound. The PolyMem actually seems to have done extremely well for him. Fortunately I do not see any signs of infection locally nor systemically at this time. 01-26-2022 upon evaluation today patient appears to be making progress. Fortunately there does not appear to be any signs of infection which is great news. No fevers, chills, nausea, vomiting, or diarrhea. With that being said this is very slow to heal and although it is smaller I still feel like we may want to try to do something to speed this up I think that a skin substitute could be beneficial, look into Kerecis. 02-02-2022 upon evaluation today patient's wound is actually showing signs of excellent improvement. I do not see any evidence of infection and overall I think that we are headed in the right direction. Fortunately I think that he is tolerating the dressing changes without complication. 02-09-2022 upon evaluation today  patient appears to be doing well currently in regard to his wound. It does look a little bit macerated but fortunately does not appear to be showing any signs of significant skin breakdown over the macerated area which is good news. Fortunately I do not see any evidence of active infection locally nor systemically at this point which is great news. 02-23-2022 upon evaluation today patient appears to be doing well currently in regard to his wounds. In fact his area on the ankle is actually showing signs of healing quite nicely. We do have the Apligraf ready today I am hopeful this is going to speed things up and get this closed much more effectively and quickly. Fortunately I do not see any signs of active infection locally nor systemically at this time which is great news. 03-02-2022 upon evaluation today patient appears to be doing excellent in regard to his wound. He is actually been tolerating the dressing changes without complication the wound on the left medial lower extremity is doing quite well and Apligraf seems to have been extremely beneficial for him. I am extremely pleased with where we stand today. 03/16/2022: The wound measurements are smaller today. He has accumulation of a yellow crust around the edges and slough on the surface. There is a musty odor coming from the wound. He has been getting Apligraf. 03-30-2022 upon evaluation today patient appears to be making progress. In regard to his leg ulcer. This is slow but nonetheless the Apligraf has fed things up. Fortunately I do not see any evidence of infection locally nor systemically at this time. No fevers, chills, nausea, vomiting, or diarrhea. Patient is here for Apligraf #4 today. 04-13-2022 upon evaluation today patient appears to  be doing a little bit more poorly in regard to his wound. He actually feels like his wrap may have been a little bit tight. Fortunately there does not appear to be any signs of active infection locally nor  systemically which is great news and I am pleased in that regard. 04-20-2022 upon evaluation today patient appears to be doing better in regard to his wound from the standpoint of swelling he is actually lost 33 pounds on torsemide in the past week his leg is significantly smaller compared to what it has been. Fortunately I do not see any signs of active infection locally nor systemically at this time. 04-27-2022 upon evaluation today patient's wound actually is showing signs of erythema and warmth around the edges of the wound I am actually concerned about the possibility of infection. I think we probably need to obtain a wound culture and also can recommend based on what we are seeing that we go ahead and have the patient continue to utilize the compression wrapping which I think has been of benefit. 05-04-2022 upon evaluation today patient appears to be doing well currently in regard to his wound. He has been tolerating the dressing changes without complication. Fortunately there does not appear to be any signs of active infection locally or systemically which is great news. 05-11-2022 upon evaluation today patient actually appears to be doing excellent. He has been tolerating the dressing changes we actually were trialing a new medication on him last week and this was the ergo clean Ag dressing. With that being said this actually seems to have done extremely well at this point. Fortunately I do not see any signs of active infection locally or systemically at this time. 05-18-2022 upon evaluation today patient appears to be doing okay in regard to his wound I do not see any signs of worsening also [name] any signs of significant improvement. Fortunately I do believe that removing in the right direction in general. 05-25-22 upon evaluation today patient appears to be doing well currently in regard to his wound. He has been tolerating the dressing changes without complication. Fortunately I do not see any  signs of active infection locally nor systemically which is great news and overall I am extremely pleased with where we stand currently. There does not appear to be any signs of active infection locally nor systemically which is great news. No fevers, chills, nausea, vomiting, or diarrhea. 06-01-2022 upon evaluation today patient actually appears to be making excellent progress in regard to his ankle region. I am very pleased with where we stand I think that he is doing extremely well and very pleased with endoform and drawtex combination. 06-08-2022 upon evaluation today patient appears to be doing well currently in regard to his wound. He has been making good progress and I feel like practicing improvement in the overall size of the wound. I am very pleased in that regard. Fortunately I do not see any signs of active infection locally nor systemically which is great news. No fevers, chills, nausea, vomiting, or diarrhea. 06-15-2022 upon evaluation today patient appears to be doing well currently in regard to his wound. He has been tolerating the dressing changes without complication. I do believe the endoform has been extremely helpful for him up to this point. I do not see any signs of active infection locally nor systemically which is great news and in general I do believe that we are making pretty good progress here which is excellent news. 06-22-2022 upon evaluation today  patient appears to be doing well currently in regard to his wound. He has been tolerating the dressing changes without complication. Fortunately there does not appear to be any signs of active infection at this time which is great news. I think that he is actually taking good progress with the endoform which is excellent as well. [name], [name] ([id number]) [id number] Page 4 of 12 06-29-2022 upon evaluation patient appears to be doing well currently in regard to his wound. He is actually making good  progress towards closure and I am very pleased in that regard. I do not see any evidence of active infection locally nor systemically which is great news and in general I do believe that we are making excellent progress here overall. 07-06-2022 upon evaluation today patient appears to be doing well currently in regard to his wound. He has been tolerating the dressing changes without complication. The good news is he is slowly getting smaller each time I see him. 07-13-2022 upon evaluation today patient appears to be doing well currently in regard to his wound is slowly showing signs of improvement. Fortunately I do not see any signs of infection at this time. No fevers, chills, nausea, vomiting, or diarrhea. 07-20-2022 upon evaluation today patient appears to be doing well currently in regard to his wound. He has been tolerating the dressing changes without complication. Fortunately I do not see any evidence of active infection locally or systemically which is great news and in general I do believe that we are making headway towards complete closure. This is excellent news. With that being said we are preparing for him to be leaving to go out of town in about a week and a half. For that reason we will going to make a few changes to his dressing today in order to give him something that I think will be easier for him to do when he is out of town. 07-27-2022 upon evaluation today patient appears to be doing well currently in regard to his wound. He has been tolerating the dressing changes without complication. Fortunately there does not appear to be any signs of infection. Fortunately there does not appear to be any signs of infection 08-10-2022 upon evaluation today patient appears to be doing okay in regard to his wound. He actually has been gone for 2 weeks and in the interim unfortunately he did not have result out of the Tegaderm as well as hoping for but nonetheless took good care of his wound. Does not  appear to be infected I do think we need to go back to the compression wrap however which I think would definitely benefit him. Fortunately I do not see any signs of active infection at this time which is great news. 08-17-2022 upon evaluation today patient unfortunately appears to be showing signs of infection based on what I see at this time. Fortunately I do not see any evidence of systemic infection though locally I do definitely see some changes that are concerning. I do not think that this is systemic at all. 08-24-2022 upon evaluation today patient appears to be doing well currently in regard to his wound. He has been tolerating the dressing changes this looks significantly improved compared to last week and very pleased. I do not see any signs of active infection locally or systemically at this time. 08-31-2022 upon evaluation today patient appears to be doing well currently in regard to his wound this is actually showing signs of improvement and actually very pleased with where  we stand today. Fortunately I do not see any signs of active infection which is great news. 09-14-2022 upon evaluation today patient appears to be doing well current regard to his wound with section showing signs of improvement would be using endoform which I think is really doing a great job here. Fortunately I do not see any signs of worsening overall. 09-21-2022 upon evaluation today patient appears to be doing well currently in regard to his wound. He has been tolerating the dressing changes without complication. Fortunately there does not appear to be any signs of active infection at this time which is good news. 09-28-2022 upon evaluation today patient appears to be doing well currently in regard to his wound which is actually measuring quite a bit smaller. This looks to be doing well and very pleased with where things stand today I do not see any signs of active infection locally or systemically which is great news. No  fevers, chills, nausea, vomiting, or diarrhea. 10-05-2022 upon evaluation today patient appears to be doing excellent in regard to his wounds. He has been tolerating the dressing changes without complication and in general I do believe that we will make an really good headway towards complete closure. 10/12/2022 upon evaluation today patient's wound is actually showing signs of excellent improvement. I am actually very pleased with where we stand I think that he is moving in the right direction and this is excellent news. I do not see any evidence of worsening overall 10-19-2022 upon evaluation today patient appears to be doing well currently in regard to his wound. He is showing signs of improvement and very pleased with where we stand I think that is making good progress here. 10-26-2022 upon evaluation today patient appears to be doing well currently in regard to his wound. In fact this is showing signs of excellent improvement I am actually very pleased with where we stand I do believe that he is making excellent headway towards complete closure which is great news as well. No fevers, chills, nausea, vomiting, or diarrhea. 11-02-2022 upon evaluation today patient appears to be doing great in regard to his wound although he does have an area at the top of the wrap which sometimes gets moist which to be honest he actually appears to have a little bit of a fungal infection at this point. I think that we may need to treat this he can get to it and put some cream in there he actually does that on his own UA with some CeraVe. 11-09-2022 upon evaluation today patient's wound bed actually showed signs of good granulation epithelization at this point. Fortunately I do not see any signs of worsening overall and I do believe that the patient is making good headway towards closure. Patient's wound unfortunately at the top of the wrap appears to be still causing him problems despite the cream that we have been  using over the past week. We are going to try an oral antibiotic at this time. 11/20; patient has a longstanding wound on the right medial ankle. He has very healthy looking granulation today and the wound is measuring smaller. We have been using gent and endoform. There is seems little reason to change this 12/18; longstanding wound on the right medial ankle. Wound is measuring smaller healthy granulation we have been using endoform as the primary dressing. Under illumination today probably 70 to 80% epithelialization 01/18/2023; patient has a wound to the right medial ankle and we have been using endoform with gentamicin  under 4-layer compression. Patient has no issues or complaints today. He denies signs of infection. 01/25/2023; patient presents for follow-up. We have been using endoform with gentamicin under 4-layer compression to the right lower extremity. Wound is well- healing. Patient has no issues or complaints today. Electronic Signature(s) Signed: 01/25/2023 4:27:59 PM By: [name] Corwin DO Entered By: [name] on 01/25/2023 15:32:56 [name], [name] ([id number]) [id number] Page 5 of 12 -------------------------------------------------------------------------------- Physical Exam Details Patient Name: Date of Service: BRANDONN, FLEURY [name]. 01/25/2023 3:00 PM Medical Record Number: [id number] Patient Account Number: [id number] Date of Birth/Sex: Treating RN: [name] (68 y.o. M) Primary Care Provider: Martha [name] Other Clinician: Referring Provider: Treating Provider/Extender: [name] in Treatment: 77 Constitutional respirations regular, non-labored and within target range for patient.. Cardiovascular 2+ dorsalis pedis/posterior tibialis pulses. Psychiatric pleasant and cooperative. [name] T the right medial ankle there is an open wound with granulation tissue throughout. No signs of surrounding infection. Good edema  control. o Electronic Signature(s) Signed: 01/25/2023 4:27:59 PM By: [name] Corwin DO Entered By: [name] on 01/25/2023 15:34:18 -------------------------------------------------------------------------------- Physician Orders Details Patient Name: Date of Service: Oliphant, [name]. 01/25/2023 3:00 PM Medical Record Number: [id number] Patient Account Number: [id number] Date of Birth/Sex: Treating RN: [birth year] (68 y.o. Dianna Limbo Primary Care Provider: Martha [name] Other Clinician: Referring Provider: Treating Provider/Extender: [name] in Treatment: 56 Verbal / Phone Orders: No Diagnosis Coding Follow-up Appointments ppointment in 1 week. - Dr. Mikey Bussing 02/01/23 at 3pm Return A ppointment in 2 weeks. - Dr. Mikey Bussing Return A Return appointment in 3 weeks. - Dr. Mikey Bussing Anesthetic (In clinic) Topical Lidocaine 4% applied to wound bed Cellular or Tissue Based Products Cellular or Tissue Based Product Type: - Run IVR for Kerrecis- denied #1 Apligraf applied 02/23/2022 #2 Apligraf applied 03/02/2022 #3 Apligraf applied 03/16/2022 #4 Apligraf applied 03/30/2022 HOLD APLIGRAF this week. Cellular or Tissue Based Product applied to wound bed, secured with steri-strips, cover with Adaptic or Mepitel. (DO NOT REMOVE). Bathing/ Shower/ Hygiene May shower with protection but do not get wound dressing(s) wet. Protect dressing(s) with water repellant cover (for example, large plastic bag) or a cast cover and may then take shower. Edema Control - Orders / Instructions Elevate legs to the level of the heart or above for 30 minutes daily and/or when sitting for 3-4 times a day throughout the day. Avoid standing for long periods of time. Patient to wear own compression stockings every day. - left leg Exercise regularly Moisturize legs daily. Compression stocking or Garment 30-40 mm/Hg pressure to: Non Wound Condition Right Lower Extremity pply the  following to affected area as directed: - ketoconazole ELAND, FOLKERTS [name] ([id number]) [id number] Page 6 of 12 Wound Treatment Wound #7 - Malleolus Wound Laterality: Right, Medial Cleanser: Soap and Water 1 x Per Week/30 Days Discharge Instructions: May shower and wash wound with dial antibacterial soap and water prior to dressing change. Cleanser: Vashe 5.8 (oz) 1 x Per Week/30 Days Discharge Instructions: Cleanse the wound with Vashe prior to applying a clean dressing using gauze sponges, not tissue or cotton balls. Peri-Wound Care: Sween Lotion (Moisturizing lotion) 1 x Per Week/30 Days Discharge Instructions: Apply moisturizing lotion as directed Topical: Gentamicin 1 x Per Week/30 Days Discharge Instructions: As directed by physician Prim Dressing: Endoform 2x2 in (Dispense As Written) 1 x Per Week/30 Days ary Discharge Instructions: Moisten with saline Secondary Dressing: Zetuvit Plus 4x8 in 1 x Per Week/30 Days Discharge Instructions: Apply over primary  dressing as directed. Compression Wrap: Urgo K2, (equivalent to a 4 layer) two layer compression system, regular 1 x Per Week/30 Days Discharge Instructions: Apply Urgo K2 as directed (alternative to 4 layer compression). Electronic Signature(s) Signed: 01/25/2023 4:27:59 PM By: [name] Corwin DO Entered By: [name] on 01/25/2023 15:37:00 -------------------------------------------------------------------------------- Problem List Details Patient Name: Date of Service: Pinnix, [name]. 01/25/2023 3:00 PM Medical Record Number: [id number] Patient Account Number: [id number] Date of Birth/Sex: Treating RN: [birth year] [name] (68 y.o. M) Primary Care Provider: Martha [name] Other Clinician: Referring Provider: Treating Provider/Extender: [name] in Treatment: 77 Active Problems ICD-10 Encounter Code Description Active Date MDM Diagnosis L97.518 Non-pressure chronic ulcer of  other part of right foot with other specified 08/04/2021 No Yes severity E11.621 Type 2 diabetes mellitus with foot ulcer 08/04/2021 No Yes I87.311 Chronic venous hypertension (idiopathic) with ulcer of right lower extremity 01/18/2023 No Yes I89.0 Lymphedema, not elsewhere classified 08/04/2021 No Yes Inactive Problems Resolved Problems ICD-10 [name], [name] ([id number]) [id number] Page 7 of 12 Code Description Active Date Resolved Date I87.333 Chronic venous hypertension (idiopathic) with ulcer and inflammation of bilateral lower 08/04/2021 08/04/2021 extremity L97.828 Non-pressure chronic ulcer of other part of left lower leg with other specified severity 08/04/2021 08/04/2021 L97.818 Non-pressure chronic ulcer of other part of right lower leg with other specified severity 08/04/2021 08/04/2021 Electronic Signature(s) Signed: 01/25/2023 4:27:59 PM By: [name] Corwin DO Entered By: [name] on 01/25/2023 15:30:02 -------------------------------------------------------------------------------- Progress Note Details Patient Name: Date of Service: Birch, [name]. 01/25/2023 3:00 PM Medical Record Number: [id number] Patient Account Number: [id number] Date of Birth/Sex: Treating RN: [name] (68 y.o. M) Primary Care Provider: Martha [name] Other Clinician: Referring Provider: Treating Provider/Extender: [name] in Treatment: 37 Subjective Chief Complaint Information obtained from Patient 08/04/2021; patient returns to clinic with bilateral leg wounds as well as areas on the right foot History of Present Illness (HPI) ADMISSION 03/22/2021 This is a 68 year old man with a past medical history significant for diabetes type 2, congestive heart failure, peripheral arterial disease, morbid obesity, venous insufficiency, and coronary artery disease. He has been followed by [name], who performed a transmetatarsal amputation  on the left foot in August 2022. He had issues healing that wound, but based upon [name], ultimately the TMA wound healed. During his recovery from that surgery, however, ulcers opened up over the DIP joint of the right second and third toe. These have apparently closed and reopened multiple times. It sounds like one of the issues has been moisture accumulation and maceration of the tissues causing them to reopen. At his last visit with [name], on March 01, 2021, there continues to be problems with moisture and he was referred to wound care for further evaluation and management. He had a formal aortogram with runoff performed prior to his TMA. The findings are copied here: Patient has inline flow to both feet with no significant flow-limiting lesion that would be amenable to percutaneous or open revascularization. He does have an element of small vessel disease and has a short segment occlusion of the distal anterior tibial/dorsalis pedis artery on the left foot but does have posterior tibial artery flow. Would recommend management of wounds with amputation of toes 2 and 3 on the right foot if the wounds do not heal and deteriorate. Transmetatarsal amputation on the left side has as good a blood supply as it is going to get and hopefully this will heal in the  future. Formal ABIs were done in January 2023. They are normal bilaterally. ABI Findings: +---------+------------------+-----+----------+--------+ Right Rt Pressure (mmHg)IndexWaveform Comment  +---------+------------------+-----+----------+--------+ Brachial 160    +---------+------------------+-----+----------+--------+ PTA 192 1.06 monophasic  +---------+------------------+-----+----------+--------+ DP 159 0.88 monophasic  +---------+------------------+-----+----------+--------+ Great T oe145 0.80     +---------+------------------+-----+----------+--------+ +--------+------------------+-----+---------+-------+ Left Lt Pressure (mmHg)IndexWaveform Comment +--------+------------------+-----+---------+-------+ [id number]    +--------+------------------+-----+---------+-------+ PTA 204 1.13 triphasic  +--------+------------------+-----+---------+-------+ DP 194 1.07 biphasic   +--------+------------------+-----+---------+-------+ +-------+-----------+-----------+------------+------------+ ABI/TBIT oday's ABIT oday's TBIPrevious ABIPrevious TBI [name], [name] ([id number]) [id number].pdf Page 8 of 12 +-------+-----------+-----------+------------+------------+ Right 1.06 0.8 1.26 0.65  +-------+-----------+-----------+------------+------------+ Left 1.13 amputation 1.15 amputation  +-------+-----------+-----------+------------+------------+ Previous ABI on 08/06/20 at [facility] Pedal pressures falsely elevated due to medial calcification. Summary: Right: Resting right ankle-brachial index is within normal range. The right toe-brachial index is normal. Left: Resting left ankle-brachial index is within normal range. READMISSION 08/04/2021 Mr. Wortham is now a 68 year old man who I remember from this clinic many years ago I think he had a right lower extremity predominantly venous wound at the time. He was here for 1 visit in March of this year had wounds on his right second and third toes we apparently dressed them many and they healed so he did not come back. He is listed in [place] is being a diabetic although the patient denies this says he is verified it with his primary doctor. In any case over the last several weeks or so according the patient although these wounds look somewhat more chronic than that he has developed predominantly large wounds on the right medial and right lateral ankle smaller areas on the left leg  and areas on the dorsal aspect of his right second and third toes. Its not clear how he has been dressing these. 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 The patient has known PAD. He had an angiogram in August 2022 at which time he had nonhealing wounds in both feet. On the left his major vessels in the thigh were all patent. He had three-vessel patent to the level of the ankle. He had a very short occlusion in the left anterior tibial. On the right lower extremity the major vessels in his thigh were all patent. He had three-vessel runoff to the foot sluggish filling of the anterior tibial artery. He was felt to have some component of small vessel disease but nothing that was amenable or needed revascularization. It was recommended that he have amputation of the second and third toes on the right foot if they did not heal He has been following with [name]. [name] got him juxta lite stockings although I do not think he had them on properly he has uncontrolled edema in both legs Past medical history includes type 2 diabetes [although the patient really denies this], left TMA in 2022,lower extremity wounds in fact attendance at this clinic in 2009-2010, A-fib on Eliquis, chronic venous insufficiency with secondary lymphedema history of non-Hodgkin's lymphoma. 08-11-2021 upon evaluation today patient presents for follow-up evaluation he was seen last Wednesday initially for inspection here in our clinic. With that being said he tells me that he unfortunately has been having a lot of drainage and is actually coming through his wrap. Fortunately I do not see any evidence of active infection locally or systemically at this time which is great news. No fevers, chills, nausea, vomiting, or diarrhea. With that being said there does appear to be  some evidence of local infection based on what I am seeing  today. 08-18-2021 upon evaluation today patient appears to be doing okay currently in regard to his wounds with that being said that he is doing much better but still has a long ways to go to get to where he wanted to be. I think the infection is significantly improved. He has another week of the antibiotic at this point. 08-25-2021 upon evaluation today patient's wounds are actually doing decently well he has erythema has significantly improved. I think the cellulitis is under controlled I am going to continue him on 2 more weeks of the Levaquin at 500 mg this is a lower dose but I am hoping it will be better for him. 09-08-2021 upon evaluation today patient appears to be doing excellent in regard to his wounds. Since I last saw him he was actually in the hospital where he ended up having a pacemaker put in. Subsequently he tells me that he is actually doing quite well although they were unsure whether they were going to do it due to the fact that he had the wounds on his legs. And then I am glad they did anything seem to be doing well. 09-15-2021 upon evaluation patient's wounds are actually showing signs of improvement. The right side wounds do appear to have some need for sharp debridement today and I Ernie Hew go ahead and proceed with that. I think that if we get the wounds cleaned up he will actually show signs of continued improvement. I am also leaning towards switching to [facility] which I think will be a much better option for him. 09-22-2021 patient's wounds are showing signs of excellent improvement. I am actually extremely pleased with where we stand and I think that the patient is making great progress. There does not appear to be any signs of active infection. 09-29-2021 upon evaluation today patient appears to be doing excellent in regard to his wounds. He is actually tolerating the dressing changes without complication. Fortunately I see no evidence of active infection locally or  systemically at this time which is great news and overall I am extremely pleased with where we stand currently. 06-2128 upon evaluation today patient actually appears to be doing excellent in regard to his wounds. The left leg is almost completely healed the right ankle is significantly smaller. Overall I am extremely pleased with where we stand at this point. I do not see any evidence of active infection at this time. 10-13-2021 upon evaluation today patient appears to be doing excellent in regard to his wounds. I really feel like he is making good progress here and I am very pleased in that regard. Fortunately I do not see any signs of active infection at this time. We are using the [facility] topical antibiotic therapy. 10-20-2021 upon evaluation today patient appears to be doing well with regard to his wound on the right medial ankle region the left leg is almost completely healed. I am actually very pleased with where we stand today. 11-03-2021 upon evaluation today patient's wound actually is going require some sharp debridement but appears to be doing much better which is great news. Fortunately I do not see any signs of active infection at this time. 11-10-2021 upon evaluation today patient's wound is actually showing signs of excellent improvement. Fortunately I do not see any evidence of infection locally or systemically which is great news and overall I am extremely pleased with where we stand today. I do believe he  is making good progress he does have his [facility] topical antibiotics with him here today. 11-24-2021 upon evaluation today patient actually showing signs of excellent improvement this appears to be doing much better. Fortunately I do not see any evidence of infection locally or systemically at this time. 12-01-2021 upon evaluation today patient appears to be doing well currently in regard to his wound. He has been tolerating the dressing changes without complication. Fortunately I  do not see any evidence of active infection at this time which is great news and overall I am extremely pleased with where we stand today. 12-15-2021 upon evaluation today patient appears to be doing well with regard to his wound. He is showing signs of improvement is slow but nonetheless we are [name], [name] ([id number]) [id number] Page 9 of 12 making improvements here. Fortunately I do not see any evidence of active infection locally nor systemically at this time. We are still using the [facility] topical antibiotic over the open area only. 12-29-2021 upon evaluation today patient's wound actually is showing signs of excellent improvement. It has been 2 weeks since I perform any debridement and it definitely shows he has some tissue that needs to be cleaned away but I think we can do so quite easily and readily today. The good news is I do not see any signs of infection I think he is doing much better in that regard. Overall I am extremely pleased with where we stand. 01-12-2022 upon evaluation today patient appears to be doing well currently in regard to his wound although it is not getting significantly smaller it is also not getting any larger. Fortunately I do not see any evidence of infection locally nor systemically which is great news and overall I am extremely pleased with where things stand currently. 01-19-2022 upon evaluation today patient appears to be doing well currently in regard to his wound. The PolyMem actually seems to have done extremely well for him. Fortunately I do not see any signs of infection locally nor systemically at this time. 01-26-2022 upon evaluation today patient appears to be making progress. Fortunately there does not appear to be any signs of infection which is great news. No fevers, chills, nausea, vomiting, or diarrhea. With that being said this is very slow to heal and although it is smaller I still feel like we may want to try to  do something to speed this up I think that a skin substitute could be beneficial, look into Kerecis. 02-02-2022 upon evaluation today patient's wound is actually showing signs of excellent improvement. I do not see any evidence of infection and overall I think that we are headed in the right direction. Fortunately I think that he is tolerating the dressing changes without complication. 02-09-2022 upon evaluation today patient appears to be doing well currently in regard to his wound. It does look a little bit macerated but fortunately does not appear to be showing any signs of significant skin breakdown over the macerated area which is good news. Fortunately I do not see any evidence of active infection locally nor systemically at this point which is great news. 02-23-2022 upon evaluation today patient appears to be doing well currently in regard to his wounds. In fact his area on the ankle is actually showing signs of healing quite nicely. We do have the Apligraf ready today I am hopeful this is going to speed things up and get this closed much more effectively and quickly. Fortunately I do not see any signs of  active infection locally nor systemically at this time which is great news. 03-02-2022 upon evaluation today patient appears to be doing excellent in regard to his wound. He is actually been tolerating the dressing changes without complication the wound on the left medial lower extremity is doing quite well and Apligraf seems to have been extremely beneficial for him. I am extremely pleased with where we stand today. 03/16/2022: The wound measurements are smaller today. He has accumulation of a yellow crust around the edges and slough on the surface. There is a musty odor coming from the wound. He has been getting Apligraf. 03-30-2022 upon evaluation today patient appears to be making progress. In regard to his leg ulcer. This is slow but nonetheless the Apligraf has fed things up. Fortunately I do  not see any evidence of infection locally nor systemically at this time. No fevers, chills, nausea, vomiting, or diarrhea. Patient is here for Apligraf #4 today. 04-13-2022 upon evaluation today patient appears to be doing a little bit more poorly in regard to his wound. He actually feels like his wrap may have been a little bit tight. Fortunately there does not appear to be any signs of active infection locally nor systemically which is great news and I am pleased in that regard. 04-20-2022 upon evaluation today patient appears to be doing better in regard to his wound from the standpoint of swelling he is actually lost 33 pounds on torsemide in the past week his leg is significantly smaller compared to what it has been. Fortunately I do not see any signs of active infection locally nor systemically at this time. 04-27-2022 upon evaluation today patient's wound actually is showing signs of erythema and warmth around the edges of the wound I am actually concerned about the possibility of infection. I think we probably need to obtain a wound culture and also can recommend based on what we are seeing that we go ahead and have the patient continue to utilize the compression wrapping which I think has been of benefit. 05-04-2022 upon evaluation today patient appears to be doing well currently in regard to his wound. He has been tolerating the dressing changes without complication. Fortunately there does not appear to be any signs of active infection locally or systemically which is great news. 05-11-2022 upon evaluation today patient actually appears to be doing excellent. He has been tolerating the dressing changes we actually were trialing a new medication on him last week and this was the ergo clean Ag dressing. With that being said this actually seems to have done extremely well at this point. Fortunately I do not see any signs of active infection locally or systemically at this time. 05-18-2022 upon  evaluation today patient appears to be doing okay in regard to his wound I do not see any signs of worsening also [name] any signs of significant improvement. Fortunately I do believe that removing in the right direction in general. 05-25-22 upon evaluation today patient appears to be doing well currently in regard to his wound. He has been tolerating the dressing changes without complication. Fortunately I do not see any signs of active infection locally nor systemically which is great news and overall I am extremely pleased with where we stand currently. There does not appear to be any signs of active infection locally nor systemically which is great news. No fevers, chills, nausea, vomiting, or diarrhea. 06-01-2022 upon evaluation today patient actually appears to be making excellent progress in regard to his ankle region. I  am very pleased with where we stand I think that he is doing extremely well and very pleased with endoform and drawtex combination. 06-08-2022 upon evaluation today patient appears to be doing well currently in regard to his wound. He has been making good progress and I feel like practicing improvement in the overall size of the wound. I am very pleased in that regard. Fortunately I do not see any signs of active infection locally nor systemically which is great news. No fevers, chills, nausea, vomiting, or diarrhea. 06-15-2022 upon evaluation today patient appears to be doing well currently in regard to his wound. He has been tolerating the dressing changes without complication. I do believe the endoform has been extremely helpful for him up to this point. I do not see any signs of active infection locally nor systemically which is great news and in general I do believe that we are making pretty good progress here which is excellent news. 06-22-2022 upon evaluation today patient appears to be doing well currently in regard to his wound. He has been tolerating the dressing changes  without complication. Fortunately there does not appear to be any signs of active infection at this time which is great news. I think that he is actually taking good progress with the endoform which is excellent as well. 06-29-2022 upon evaluation patient appears to be doing well currently in regard to his wound. He is actually making good progress towards closure and I am very pleased in that regard. I do not see any evidence of active infection locally nor systemically which is great news and in general I do believe that we are making excellent progress here overall. 07-06-2022 upon evaluation today patient appears to be doing well currently in regard to his wound. He has been tolerating the dressing changes without complication. The good news is he is slowly getting smaller each time I see him. 07-13-2022 upon evaluation today patient appears to be doing well currently in regard to his wound is slowly showing signs of improvement. Fortunately I do not see any signs of infection at this time. No fevers, chills, nausea, vomiting, or diarrhea. 07-20-2022 upon evaluation today patient appears to be doing well currently in regard to his wound. He has been tolerating the dressing changes without [name], [name] ([id number]) [id number] Page 10 of 12 complication. Fortunately I do not see any evidence of active infection locally or systemically which is great news and in general I do believe that we are making headway towards complete closure. This is excellent news. With that being said we are preparing for him to be leaving to go out of town in about a week and a half. For that reason we will going to make a few changes to his dressing today in order to give him something that I think will be easier for him to do when he is out of town. 07-27-2022 upon evaluation today patient appears to be doing well currently in regard to his wound. He has been tolerating the dressing changes  without complication. Fortunately there does not appear to be any signs of infection. Fortunately there does not appear to be any signs of infection 08-10-2022 upon evaluation today patient appears to be doing okay in regard to his wound. He actually has been gone for 2 weeks and in the interim unfortunately he did not have result out of the Tegaderm as well as hoping for but nonetheless took good care of his wound. Does not appear to be infected  I do think we need to go back to the compression wrap however which I think would definitely benefit him. Fortunately I do not see any signs of active infection at this time which is great news. 08-17-2022 upon evaluation today patient unfortunately appears to be showing signs of infection based on what I see at this time. Fortunately I do not see any evidence of systemic infection though locally I do definitely see some changes that are concerning. I do not think that this is systemic at all. 08-24-2022 upon evaluation today patient appears to be doing well currently in regard to his wound. He has been tolerating the dressing changes this looks significantly improved compared to last week and very pleased. I do not see any signs of active infection locally or systemically at this time. 08-31-2022 upon evaluation today patient appears to be doing well currently in regard to his wound this is actually showing signs of improvement and actually very pleased with where we stand today. Fortunately I do not see any signs of active infection which is great news. 09-14-2022 upon evaluation today patient appears to be doing well current regard to his wound with section showing signs of improvement would be using endoform which I think is really doing a great job here. Fortunately I do not see any signs of worsening overall. 09-21-2022 upon evaluation today patient appears to be doing well currently in regard to his wound. He has been tolerating the dressing changes  without complication. Fortunately there does not appear to be any signs of active infection at this time which is good news. 09-28-2022 upon evaluation today patient appears to be doing well currently in regard to his wound which is actually measuring quite a bit smaller. This looks to be doing well and very pleased with where things stand today I do not see any signs of active infection locally or systemically which is great news. No fevers, chills, nausea, vomiting, or diarrhea. 10-05-2022 upon evaluation today patient appears to be doing excellent in regard to his wounds. He has been tolerating the dressing changes without complication and in general I do believe that we will make an really good headway towards complete closure. 10/12/2022 upon evaluation today patient's wound is actually showing signs of excellent improvement. I am actually very pleased with where we stand I think that he is moving in the right direction and this is excellent news. I do not see any evidence of worsening overall 10-19-2022 upon evaluation today patient appears to be doing well currently in regard to his wound. He is showing signs of improvement and very pleased with where we stand I think that is making good progress here. 10-26-2022 upon evaluation today patient appears to be doing well currently in regard to his wound. In fact this is showing signs of excellent improvement I am actually very pleased with where we stand I do believe that he is making excellent headway towards complete closure which is great news as well. No fevers, chills, nausea, vomiting, or diarrhea. 11-02-2022 upon evaluation today patient appears to be doing great in regard to his wound although he does have an area at the top of the wrap which sometimes gets moist which to be honest he actually appears to have a little bit of a fungal infection at this point. I think that we may need to treat this he can get to it and put some cream in there he  actually does that on his own UA with some CeraVe.  11-09-2022 upon evaluation today patient's wound bed actually showed signs of good granulation epithelization at this point. Fortunately I do not see any signs of worsening overall and I do believe that the patient is making good headway towards closure. Patient's wound unfortunately at the top of the wrap appears to be still causing him problems despite the cream that we have been using over the past week. We are going to try an oral antibiotic at this time. 11/20; patient has a longstanding wound on the right medial ankle. He has very healthy looking granulation today and the wound is measuring smaller. We have been using gent and endoform. There is seems little reason to change this 12/18; longstanding wound on the right medial ankle. Wound is measuring smaller healthy granulation we have been using endoform as the primary dressing. Under illumination today probably 70 to 80% epithelialization 01/18/2023; patient has a wound to the right medial ankle and we have been using endoform with gentamicin under 4-layer compression. Patient has no issues or complaints today. He denies signs of infection. 01/25/2023; patient presents for follow-up. We have been using endoform with gentamicin under 4-layer compression to the right lower extremity. Wound is well- healing. Patient has no issues or complaints today. Objective Constitutional respirations regular, non-labored and within target range for patient.. Vitals Time Taken: 3:04 PM, Height: 71 in, Weight: 350 lbs, BMI: 48.8, Temperature: 98.2 F, Pulse: 71 bpm, Respiratory Rate: 18 breaths/min, Blood Pressure: 134/87 mmHg. Cardiovascular 2+ dorsalis pedis/posterior tibialis pulses. Psychiatric pleasant and cooperative. General [name]: T the right medial ankle there is an open wound with granulation tissue throughout. No signs of surrounding infection. Good edema control. o Integumentary (Hair,  Skin) Liberatore, [name] ([id number]) [id number] Page 11 of 12 Wound #7 status is Open. Original cause of wound was Blister. The date acquired was: 07/21/2021. The wound has been in treatment 77 weeks. The wound is located on the Right,Medial Malleolus. The wound measures 1.2cm length x 0.6cm width x 0.1cm depth; 0.565cm^2 area and 0.057cm^3 volume. There is Fat Layer (Subcutaneous Tissue) exposed. There is no tunneling or undermining noted. There is a medium amount of serosanguineous drainage noted. The wound margin is distinct with the outline attached to the wound base. There is large (67-100%) red granulation within the wound bed. There is no necrotic tissue within the wound bed. The periwound skin appearance exhibited: Hemosiderin Staining. The periwound skin appearance did not exhibit: Callus, Crepitus, Excoriation, Induration, Rash, Scarring, Dry/Scaly, Maceration, Atrophie Blanche, Cyanosis, Ecchymosis, Mottled, Pallor, Rubor, Erythema. Periwound temperature was noted as No Abnormality. 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 I recommended continuing with endoform and antibiotic ointment under compression therapy. Follow-up in 1 week. Procedures Wound #7 Pre-procedure diagnosis of Wound #7 is a Venous Leg Ulcer located on the Right,Medial Malleolus . There was a Four Layer Compression Therapy Procedure by Karie Schwalbe, RN. Post procedure Diagnosis Wound #7: Same as Pre-Procedure Plan Follow-up Appointments: Return Appointment in 1 week. - Dr. Mikey Bussing 02/01/23 at 3pm Return Appointment in 2 weeks. - Dr. Mikey Bussing Return appointment in 3 weeks. - Dr.  Mikey Bussing Anesthetic: (In clinic) Topical Lidocaine 4% applied to wound bed Cellular or Tissue Based Products: Cellular or Tissue Based Product Type: - Run IVR for Kerrecis- denied #  1 Apligraf applied 02/23/2022 #2 Apligraf applied 03/02/2022 #3 Apligraf applied 03/16/2022 #4 Apligraf applied 03/30/2022 HOLD APLIGRAF this week. Cellular or Tissue Based Product applied to wound bed, secured with steri-strips, cover with Adaptic or Mepitel. (DO NOT REMOVE). Bathing/ Shower/ Hygiene: May shower with protection but do not get wound dressing(s) wet. Protect dressing(s) with water repellant cover (for example, large plastic bag) or a cast cover and may then take shower. Edema Control - Orders / Instructions: Elevate legs to the level of the heart or above for 30 minutes daily and/or when sitting for 3-4 times a day throughout the day. Avoid standing for long periods of time. Patient to wear own compression stockings every day. - left leg Exercise regularly Moisturize legs daily. Compression stocking or Garment 30-40 mm/Hg pressure to: Non Wound Condition: Apply the following to affected area as directed: - ketoconazole WOUND #7: - Malleolus Wound Laterality: Right, Medial Cleanser: Soap and Water 1 x Per Week/30 Days Discharge Instructions: May shower and wash wound with dial antibacterial soap and water prior to dressing change. Cleanser: Vashe 5.8 (oz) 1 x Per Week/30 Days Discharge Instructions: Cleanse the wound with Vashe prior to applying a clean dressing using gauze sponges, not tissue or cotton balls. Peri-Wound Care: Sween Lotion (Moisturizing lotion) 1 x Per Week/30 Days Discharge Instructions: Apply moisturizing lotion as directed Topical: Gentamicin 1 x Per Week/30 Days Discharge Instructions: As directed by physician Prim Dressing: Endoform 2x2 in (Dispense As Written) 1 x Per Week/30 Days ary Discharge Instructions: Moisten with saline Secondary Dressing: Zetuvit Plus 4x8 in 1 x Per  Week/30 Days Discharge Instructions: Apply over primary dressing as directed. Com pression Wrap: Urgo K2, (equivalent to a 4 layer) two layer compression system, regular 1 x Per Week/30 Days Discharge Instructions: Apply Urgo K2 as directed (alternative to 4 layer compression). 1. Endoform and antibiotic ointment under 4-layer compression to the right lower extremity 2. Follow-up in 1 week [name], [name] ([id number]) [id number] Page 12 of 12 Electronic Signature(s) Signed: 01/30/2023 2:44:18 PM By: Shawn Stall RN, BSN Signed: 01/30/2023 3:23:55 PM By: [name] Corwin DO Previous Signature: 01/25/2023 4:27:59 PM Version By: [name] Corwin DO Entered By: Shawn Stall on 01/30/2023 14:40:23 -------------------------------------------------------------------------------- SuperBill Details Patient Name: Date of Service: Roseboom, [name]. 01/25/2023 Medical Record Number: [id number] Patient Account Number: [id number] Date of Birth/Sex: Treating RN: [name] (68 y.o. M) Primary Care Provider: Martha [name] Other Clinician: Referring Provider: Treating Provider/Extender: [name] in Treatment: 77 Diagnosis Coding ICD-10 Codes Code Description [phone] Non-pressure chronic ulcer of other part of right foot with other specified severity E11.621 Type 2 diabetes mellitus with foot ulcer I87.311 Chronic venous hypertension (idiopathic) with ulcer of right lower extremity I89.0 Lymphedema, not elsewhere classified Facility Procedures : 3 CPT4 Code: 5284132 Description: (Facility Use Only) [phone] - APPLY MULTLAY COMPRS LWR RT LEG Modifier: Quantity: 1 Physician Procedures : CPT4 Code Description Modifier [id number] - WC PHYS LEVEL 3 - EST PT ICD-10 Diagnosis Description L97.518 Non-pressure chronic ulcer of other part of right foot with other specified severity E11.621 Type 2 diabetes mellitus with foot ulcer I87.311  Chronic venous  hypertension (idiopathic) with ulcer of right lower extremity I89.0 Lymphedema, not elsewhere classified Quantity: 1 Electronic Signature(s) Signed: 01/25/2023 4:27:59 PM By: [name] Corwin DO Signed: 01/25/2023 5:22:26 PM By: Karie Schwalbe RN Entered By: Karie Schwalbe on 01/25/2023 15:39:13

## 2023-01-30 ENCOUNTER — Ambulatory Visit (HOSPITAL_COMMUNITY)
Admission: RE | Admit: 2023-01-30 | Discharge: 2023-01-30 | Disposition: A | Discharge status: Home / Self Care | Payer: Medicare Other | Source: Ambulatory Visit | Attending: Internal Medicine | Admitting: Internal Medicine

## 2023-01-30 ENCOUNTER — Encounter: Payer: Self-pay | Admitting: Internal Medicine

## 2023-01-30 ENCOUNTER — Ambulatory Visit (HOSPITAL_COMMUNITY): Payer: Medicare Other

## 2023-01-30 ENCOUNTER — Encounter (HOSPITAL_COMMUNITY): Payer: Self-pay

## 2023-01-30 ENCOUNTER — Other Ambulatory Visit (HOSPITAL_COMMUNITY): Payer: Self-pay

## 2023-01-30 VITALS — BP 104/72 | HR 80 | Wt 345.2 lb

## 2023-01-30 DIAGNOSIS — Z9221 Personal history of antineoplastic chemotherapy: Secondary | ICD-10-CM | POA: Diagnosis not present

## 2023-01-30 DIAGNOSIS — G4733 Obstructive sleep apnea (adult) (pediatric): Secondary | ICD-10-CM | POA: Diagnosis not present

## 2023-01-30 DIAGNOSIS — Z87891 Personal history of nicotine dependence: Secondary | ICD-10-CM | POA: Insufficient documentation

## 2023-01-30 DIAGNOSIS — I482 Chronic atrial fibrillation, unspecified: Secondary | ICD-10-CM | POA: Diagnosis not present

## 2023-01-30 DIAGNOSIS — J849 Interstitial pulmonary disease, unspecified: Secondary | ICD-10-CM | POA: Diagnosis not present

## 2023-01-30 DIAGNOSIS — I428 Other cardiomyopathies: Secondary | ICD-10-CM | POA: Insufficient documentation

## 2023-01-30 DIAGNOSIS — E11622 Type 2 diabetes mellitus with other skin ulcer: Secondary | ICD-10-CM | POA: Diagnosis not present

## 2023-01-30 DIAGNOSIS — E119 Type 2 diabetes mellitus without complications: Secondary | ICD-10-CM | POA: Diagnosis not present

## 2023-01-30 DIAGNOSIS — L97909 Non-pressure chronic ulcer of unspecified part of unspecified lower leg with unspecified severity: Secondary | ICD-10-CM | POA: Insufficient documentation

## 2023-01-30 DIAGNOSIS — I5022 Chronic systolic (congestive) heart failure: Secondary | ICD-10-CM | POA: Insufficient documentation

## 2023-01-30 DIAGNOSIS — C859A Non-Hodgkin lymphoma, unspecified, in remission: Secondary | ICD-10-CM | POA: Insufficient documentation

## 2023-01-30 DIAGNOSIS — R001 Bradycardia, unspecified: Secondary | ICD-10-CM | POA: Insufficient documentation

## 2023-01-30 DIAGNOSIS — I2510 Atherosclerotic heart disease of native coronary artery without angina pectoris: Secondary | ICD-10-CM

## 2023-01-30 DIAGNOSIS — Z95810 Presence of automatic (implantable) cardiac defibrillator: Secondary | ICD-10-CM | POA: Insufficient documentation

## 2023-01-30 DIAGNOSIS — L98499 Non-pressure chronic ulcer of skin of other sites with unspecified severity: Secondary | ICD-10-CM

## 2023-01-30 DIAGNOSIS — I739 Peripheral vascular disease, unspecified: Secondary | ICD-10-CM | POA: Diagnosis not present

## 2023-01-30 DIAGNOSIS — E785 Hyperlipidemia, unspecified: Secondary | ICD-10-CM | POA: Diagnosis not present

## 2023-01-30 DIAGNOSIS — I5082 Biventricular heart failure: Secondary | ICD-10-CM | POA: Diagnosis not present

## 2023-01-30 DIAGNOSIS — E1151 Type 2 diabetes mellitus with diabetic peripheral angiopathy without gangrene: Secondary | ICD-10-CM | POA: Insufficient documentation

## 2023-01-30 DIAGNOSIS — Z8572 Personal history of non-Hodgkin lymphomas: Secondary | ICD-10-CM

## 2023-01-30 DIAGNOSIS — I4439 Other atrioventricular block: Secondary | ICD-10-CM | POA: Insufficient documentation

## 2023-01-30 DIAGNOSIS — Z6841 Body Mass Index (BMI) 40.0 and over, adult: Secondary | ICD-10-CM | POA: Insufficient documentation

## 2023-01-30 DIAGNOSIS — I110 Hypertensive heart disease with heart failure: Secondary | ICD-10-CM | POA: Diagnosis not present

## 2023-01-30 LAB — BASIC METABOLIC PANEL
Anion gap: 12 (ref 5–15)
BUN: 29 mg/dL — ABNORMAL HIGH (ref 8–23)
CO2: 26 mmol/L (ref 22–32)
Calcium: 9.3 mg/dL (ref 8.9–10.3)
Chloride: 97 mmol/L — ABNORMAL LOW (ref 98–111)
Creatinine, Ser: 1.26 mg/dL — ABNORMAL HIGH (ref 0.61–1.24)
GFR, Estimated: >60 mL/min (ref >60)
Glucose, Bld: 107 mg/dL — ABNORMAL HIGH (ref 70–99)
Potassium: 4.4 mmol/L (ref 3.5–5.1)
Sodium: 135 mmol/L (ref 135–145)

## 2023-01-30 LAB — CBC
HCT: 52.6 % — ABNORMAL HIGH (ref 39.0–52.0)
Hemoglobin: 17.6 g/dL — ABNORMAL HIGH (ref 13.0–17.0)
MCH: 34.2 pg — ABNORMAL HIGH (ref 26.0–34.0)
MCHC: 33.5 g/dL (ref 30.0–36.0)
MCV: 102.1 fL — ABNORMAL HIGH (ref 80.0–100.0)
Platelets: 113 10*3/uL — ABNORMAL LOW (ref 150–400)
RBC: 5.15 MIL/uL (ref 4.22–5.81)
RDW: 13.7 % (ref 11.5–15.5)
WBC: 9.8 10*3/uL (ref 4.0–10.5)
nRBC: 0 % (ref 0.0–0.2)

## 2023-01-30 LAB — BRAIN NATRIURETIC PEPTIDE: B Natriuretic Peptide: 157.7 pg/mL — ABNORMAL HIGH (ref 0.0–100.0)

## 2023-01-30 MED ORDER — ENTRESTO 24-26 MG PO TABS: 1.0000 | ORAL_TABLET | Freq: Two times a day (BID) | ORAL | 3 refills | Status: AC

## 2023-01-30 MED ORDER — TORSEMIDE 20 MG PO TABS
ORAL_TABLET | ORAL | Status: DC
Start: 2023-01-30 — End: 2023-04-03

## 2023-01-30 NOTE — Patient Instructions (Signed)
Medication Changes:  START: ENTRESTO 24/26MG  TWICE DAILY   Lab Work:  Labs done today, your results will be available in MyChart, we will contact you for abnormal readings.  THEN AGAIN IN 10 DAYS AS SCHEDULED   Follow-Up in: 3 MONTHS WITH APP AS SCHEDULED   At [facility], you and your health needs are our priority. We have a designated team specialized in the treatment of Heart Failure. This Care Team includes your primary Heart Failure Specialized Cardiologist (physician), Advanced Practice Providers (APPs- Physician Assistants and Nurse Practitioners), and Pharmacist who all work together to provide you with the care you need, when you need it.   You may see any of the following providers on your designated Care Team at your next follow up:  Dr. Arvilla Meres Dr. Marca Ancona Dr. Dorthula Nettles Dr. Theresia Bough Tonye Becket, NP Robbie Lis, [place] [facility] [place] [place], [place] Brynda Peon, NP [place] Lee, NP Karle Plumber, PharmD   Please be sure to bring in all your medications bottles to every appointment.   Need to Contact us:  If you have any questions or concerns before your next appointment please send [place] a message through [place] or call our office at [phone].    TO LEAVE A MESSAGE FOR THE NURSE SELECT OPTION 2, PLEASE LEAVE A MESSAGE INCLUDING: YOUR NAME DATE OF BIRTH CALL BACK NUMBER REASON FOR CALL**this is important as we prioritize the call backs  YOU WILL RECEIVE A CALL BACK THE SAME DAY AS LONG AS YOU CALL BEFORE 4:00 PM

## 2023-01-31 NOTE — Progress Notes (Signed)
RAZI, WAITE ([id number]) [id number] Page 1 of 7 Visit Report for 01/25/2023 Arrival Information Details Patient Name: Date of Service: [name], [name] 01/25/2023 3:00 PM Medical Record Number: [id number] Patient Account Number: [id number] Date of Birth/Sex: Treating RN: 1955-07-07 (68 y.o. M) Primary Care Aracely Rickett: [name] Other Clinician: Referring Jamaya Sleeth: Treating Cyann Venti/Extender: Jamse Mead in Treatment: 77 Visit Information History Since Last Visit Added or deleted any medications: No Patient Arrived: Walker Any new allergies or adverse reactions: No Arrival Time: 14:57 Had a fall or experienced change in No Accompanied By: self activities of daily living that may affect Transfer Assistance: None risk of falls: Patient Identification Verified: Yes Signs or symptoms of abuse/neglect since last visito No Secondary Verification Process Completed: Yes Hospitalized since last visit: No Patient Requires Transmission-Based Precautions: No Implantable device outside of the clinic excluding No Patient Has Alerts: Yes cellular tissue based products placed in the center Patient Alerts: Patient on Blood Thinner since last visit: 01/2021 ABI L 1.13 R 1.06 Has Dressing in Place as Prescribed: Yes 01/2021 TBI L amp R 0.8 Has Compression in Place as Prescribed: Yes Pain Present Now: No Electronic Signature(s) Signed: 01/31/2023 4:04:36 PM By: Thayer Dallas Entered By: Thayer Dallas on 01/25/2023 14:57:37 -------------------------------------------------------------------------------- Compression Therapy Details Patient Name: Date of Service: [name], [name] 01/25/2023 3:00 PM Medical Record Number: [id number] Patient Account Number: [id number] Date of Birth/Sex: Treating RN: [birth year] (68 y.o. Dianna Limbo Primary Care Sinai Illingworth: [name] Other Clinician: Referring Tobie Perdue: Treating Remijio Holleran/Extender: Jamse Mead in Treatment: 24 Compression Therapy Performed for Wound Assessment: Wound #7 Right,Medial Malleolus Performed By: Clinician Karie Schwalbe, RN Compression Type: Four Layer Post Procedure Diagnosis Same as Pre-procedure Electronic Signature(s) Signed: 01/25/2023 5:22:26 PM By: Karie Schwalbe RN Entered By: Karie Schwalbe on 01/25/2023 15:38:40 Puffenbarger, Christie D ([id number]) [id number] Page 2 of 7 -------------------------------------------------------------------------------- Encounter Discharge Information Details Patient Name: Date of Service: ERVIN, BHARDWAJ 01/25/2023 3:00 PM Medical Record Number: [id number] Patient Account Number: [id number] Date of Birth/Sex: Treating RN: [birth year] (68 y.o. Dianna Limbo Primary Care Breyah Akhter: [name] Other Clinician: Referring Daniela Siebers: Treating Brighten Orndoff/Extender: Jamse Mead in Treatment: 8 Encounter Discharge Information Items Discharge Condition: Stable Ambulatory Status: Walker Discharge Destination: Home Transportation: Private Auto Accompanied By: self Schedule Follow-up Appointment: Yes Clinical Summary of Care: Patient Declined Electronic Signature(s) Signed: 01/25/2023 5:22:26 PM By: Karie Schwalbe RN Entered By: Karie Schwalbe on 01/25/2023 17:06:02 -------------------------------------------------------------------------------- Lower Extremity Assessment Details Patient Name: Date of Service: [name], [name] 01/25/2023 3:00 PM Medical Record Number: [id number] Patient Account Number: [id number] Date of Birth/Sex: Treating RN: [birth year] (68 y.o. M) Primary Care Burr Soffer: [name] Other Clinician: Referring Lenardo Westwood: Treating Rossana Molchan/Extender: Jamse Mead in Treatment: 77 Edema Assessment Assessed: Kyra Searles: No] Franne Forts: No] Edema: [Left: N] [Right: o] Calf Left: Right: Point of Measurement: 31 cm From  Medial Instep 39 cm Ankle Left: Right: Point of Measurement: 11 cm From Medial Instep 25 cm Vascular Assessment Extremity colors, hair growth, and conditions: Extremity Color: [Right:Normal] Hair Growth on Extremity: [Right:No] Temperature of Extremity: [Right:Warm] Capillary Refill: [Right:< 3 seconds] Dependent Rubor: [Right:No Yes] Toe Nail Assessment Left: Right: Thick: No Discolored: No Deformed: No Improper Length and Hygiene: No Electronic Signature(s) Signed: 01/31/2023 4:04:36 PM By: Doristine Devoid D ([id number]) [id number] Page 3 of 7 Entered By: Thayer Dallas on 01/25/2023 15:03:29 -------------------------------------------------------------------------------- Multi Wound Chart Details Patient Name: Date of Service: [name], [name]  01/25/2023 3:00 PM Medical Record Number: [id number] Patient Account Number: [id number] Date of Birth/Sex: Treating RN: June 26, 1955 (68 y.o. M) Primary Care Stamatia Masri: [name] Other Clinician: Referring Philopater Mucha: Treating Krystofer Hevener/Extender: Jamse Mead in Treatment: 77 Vital Signs Height(in): 71 Pulse(bpm): 71 Weight(lbs): 350 Blood Pressure(mmHg): 134/87 Body Mass Index(BMI): 48.8 Temperature(F): 98.2 Respiratory Rate(breaths/min): 18 [7:Photos:] [N/A:N/A] Right, Medial Malleolus N/A N/A Wound Location: Blister N/A N/A Wounding Event: Venous Leg Ulcer N/A N/A Primary Etiology: Lymphedema N/A N/A Secondary Etiology: Sleep Apnea, Arrhythmia, Congestive N/A N/A Comorbid History: Heart Failure, Hypertension, Hypotension, Peripheral Arterial Disease, Peripheral Venous Disease, Type II Diabetes, Gout, Osteoarthritis 07/21/2021 N/A N/A Date Acquired: 58 N/A N/A Weeks of Treatment: Open N/A N/A Wound Status: No N/A N/A Wound Recurrence: 1.2x0.6x0.1 N/A N/A Measurements L x W x D (cm) 0.565 N/A N/A A (cm) : rea 0.057 N/A N/A Volume (cm) : 98.50% N/A  N/A % Reduction in Area: 99.80% N/A N/A % Reduction in Volume: Full Thickness With Exposed Support N/A N/A Classification: Structures Medium N/A N/A Exudate Amount: Serosanguineous N/A N/A Exudate Type: red, brown N/A N/A Exudate Color: Distinct, outline attached N/A N/A Wound Margin: Large (67-100%) N/A N/A Granulation Amount: Red N/A N/A Granulation Quality: None Present (0%) N/A N/A Necrotic Amount: Fat Layer (Subcutaneous Tissue): Yes N/A N/A Exposed Structures: Fascia: No Tendon: No Muscle: No Joint: No Bone: No Large (67-100%) N/A N/A Epithelialization: Excoriation: No N/A N/A Periwound Skin Texture: Induration: No Callus: No Crepitus: No Rash: No Scarring: No Maceration: No N/A N/A Periwound Skin Moisture: Dry/Scaly: No Hemosiderin Staining: Yes N/A N/A Periwound Skin Color: Atrophie KHALIFAH, MACDOUGALL D ([id number]) [id number] Page 4 of 7 Cyanosis: No Ecchymosis: No Erythema: No Mottled: No Pallor: No Rubor: No No Abnormality N/A N/A Temperature: Treatment Notes Electronic Signature(s) Signed: 01/25/2023 4:27:59 PM By: Geralyn Corwin DO Entered By: Geralyn Corwin on 01/25/2023 15:30:08 -------------------------------------------------------------------------------- Multi-Disciplinary Care Plan Details Patient Name: Date of Service: Roehrich, [name] 01/25/2023 3:00 PM Medical Record Number: [id number] Patient Account Number: [id number] Date of Birth/Sex: Treating RN: [birth year] (68 y.o. Dianna Limbo Primary Care Joanette Silveria: [name] Other Clinician: Referring Carnelia Oscar: Treating Demitrios Molyneux/Extender: Jamse Mead in Treatment: 62 Multidisciplinary Care Plan reviewed with physician Active Inactive Pain, Acute or Chronic Nursing Diagnoses: Pain, acute or chronic: actual or potential Potential alteration in comfort, pain Goals: Patient will verbalize adequate pain control and  receive pain control interventions during procedures as needed Date Initiated: 08/04/2021 Target Resolution Date: 03/10/2023 Goal Status: Active Patient/caregiver will verbalize comfort level met Date Initiated: 08/04/2021 Date Inactivated: 07/27/2022 Target Resolution Date: 09/10/2022 Goal Status: Met Interventions: Encourage patient to take pain medications as prescribed Provide education on pain management Reposition patient for comfort Treatment Activities: Administer pain control measures as ordered : 08/04/2021 Notes: Electronic Signature(s) Signed: 01/25/2023 5:22:26 PM By: Karie Schwalbe RN Entered By: Karie Schwalbe on 01/25/2023 17:04:35 -------------------------------------------------------------------------------- Pain Assessment Details Patient Name: Date of Service: Dubeau, [name] 01/25/2023 3:00 PM Butsch, Gweneth Dimitri ([id number]) [id number] Page 5 of 7 Medical Record Number: [id number] Patient Account Number: [id number] Date of Birth/Sex: Treating RN: 02/21/1955 (68 y.o. M) Primary Care Keirstyn Aydt: [name] Other Clinician: Referring Hermes Wafer: Treating Emmerich Cryer/Extender: Jamse Mead in Treatment: 77 Active Problems Location of Pain Severity and Description of Pain Patient Has Paino No Site Locations Pain Management and Medication Current Pain Management: Electronic Signature(s) Signed: 01/31/2023 4:04:36 PM By: Thayer Dallas Entered By: Thayer Dallas on 01/25/2023 14:57:52 -------------------------------------------------------------------------------- Patient/Caregiver Education Details  Patient Name: Date of Service: LIAD, MIU 1/15/2025andnbsp3:00 PM Medical Record Number: [id number] Patient Account Number: [id number] Date of Birth/Gender: Treating RN: Dec 17, 1955 (68 y.o. Dianna Limbo Primary Care Physician: [name] Other Clinician: Referring Physician: Treating Physician/Extender: Jamse Mead in Treatment: 32 Education Assessment Education Provided To: Patient Education Topics Provided Wound/Skin Impairment: Methods: Explain/Verbal Responses: State content correctly [employer]) Signed: 01/25/2023 5:22:26 PM By: Karie Schwalbe RN Entered By: Karie Schwalbe on 01/25/2023 17:04:59 Lowdermilk, Aarib D ([id number]) [id number] Page 6 of 7 -------------------------------------------------------------------------------- Wound Assessment Details Patient Name: Date of Service: KHA, ORBIN D. 01/25/2023 3:00 PM Medical Record Number: [id number] Patient Account Number: [id number] Date of Birth/Sex: Treating RN: [birth year] (68 y.o. M) Primary Care Adrionna Delcid: [name] Other Clinician: Referring Jaren Vanetten: Treating Emiel Kielty/Extender: Jamse Mead in Treatment: 77 Wound Status Wound Number: 7 Primary Venous Leg Ulcer Etiology: Wound Location: Right, Medial Malleolus Secondary Lymphedema Wounding Event: Blister Etiology: Date Acquired: 07/21/2021 Wound Open Weeks Of Treatment: 77 Status: Clustered Wound: No Comorbid Sleep Apnea, Arrhythmia, Congestive Heart Failure, Hypertension, History: Hypotension, Peripheral Arterial Disease, Peripheral Venous Disease, Type II Diabetes, Gout, Osteoarthritis Photos Wound Measurements Length: (cm) 1.2 Width: (cm) 0.6 Depth: (cm) 0.1 Area: (cm) 0.565 Volume: (cm) 0.057 % Reduction in Area: 98.5% % Reduction in Volume: 99.8% Epithelialization: Large (67-100%) Tunneling: No Undermining: No Wound Description Classification: Full Thickness With Exposed Suppo Wound Margin: Distinct, outline attached Exudate Amount: Medium Exudate Type: Serosanguineous Exudate Color: red, brown rt Structures Foul Odor After Cleansing: No Slough/Fibrino Yes Wound Bed Granulation Amount: Large (67-100%) Exposed Structure Granulation Quality: Red Fascia Exposed:  No Necrotic Amount: None Present (0%) Fat Layer (Subcutaneous Tissue) Exposed: Yes Tendon Exposed: No Muscle Exposed: No Joint Exposed: No Bone Exposed: No Periwound Skin Texture Texture Color No Abnormalities Noted: No No Abnormalities Noted: No Callus: No Atrophie Blanche: No Crepitus: No Cyanosis: No Excoriation: No Ecchymosis: No Induration: No Erythema: No Rash: No Hemosiderin Staining: Yes Scarring: No Mottled: No Pallor: No Moisture Rubor: No No Abnormalities Noted: No Dry / Scaly: No Temperature / Pain Corbo, Altin D ([id number]) [id number] Page 7 of 7 Maceration: No Temperature: No Abnormality Treatment Notes Wound #7 (Malleolus) Wound Laterality: Right, Medial Cleanser Soap and Water Discharge Instruction: May shower and wash wound with dial antibacterial soap and water prior to dressing change. Vashe 5.8 (oz) Discharge Instruction: Cleanse the wound with Vashe prior to applying a clean dressing using gauze sponges, not tissue or cotton balls. Peri-Wound Care Sween Lotion (Moisturizing lotion) Discharge Instruction: Apply moisturizing lotion as directed Topical Gentamicin Discharge Instruction: As directed by physician Primary Dressing Endoform 2x2 in Discharge Instruction: Moisten with saline Secondary Dressing Zetuvit Plus 4x8 in Discharge Instruction: Apply over primary dressing as directed. Secured With Compression Wrap Urgo K2, (equivalent to a 4 layer) two layer compression system, regular Discharge Instruction: Apply Urgo K2 as directed (alternative to 4 layer compression). Compression Stockings Add-Ons Electronic Signature(s) Signed: 01/31/2023 4:04:36 PM By: Thayer Dallas Entered By: Thayer Dallas on 01/25/2023 15:10:44 -------------------------------------------------------------------------------- Vitals Details Patient Name: Date of Service: Hlavac, [name] 01/25/2023 3:00 PM Medical Record Number:  [id number] Patient Account Number: [id number] Date of Birth/Sex: Treating RN: Jun 05, 1955 (68 y.o. M) Primary Care Yamile Roedl: [name] Other Clinician: Referring Adisyn Ruscitti: Treating Devaun Hernandez/Extender: Jamse Mead in Treatment: 77 Vital Signs Time Taken: 15:04 Temperature (F): 98.2 Height (in): 71 Pulse (bpm): 71 Weight (lbs): 350 Respiratory Rate (breaths/min): 18 Body Mass Index (BMI): 48.8 Blood Pressure (  mmHg): 134/87 Reference Range: 80 - 120 mg / dl Electronic Signature(s) Signed: 01/31/2023 4:04:36 PM By: Thayer Dallas Entered By: Thayer Dallas on 01/25/2023 15:04:24

## 2023-02-01 ENCOUNTER — Ambulatory Visit (HOSPITAL_BASED_OUTPATIENT_CLINIC_OR_DEPARTMENT_OTHER): Payer: Medicare Other | Admitting: Internal Medicine

## 2023-02-06 ENCOUNTER — Encounter (HOSPITAL_BASED_OUTPATIENT_CLINIC_OR_DEPARTMENT_OTHER): Payer: Medicare Other | Admitting: Internal Medicine

## 2023-02-06 DIAGNOSIS — E1151 Type 2 diabetes mellitus with diabetic peripheral angiopathy without gangrene: Secondary | ICD-10-CM | POA: Diagnosis not present

## 2023-02-06 DIAGNOSIS — I890 Lymphedema, not elsewhere classified: Secondary | ICD-10-CM

## 2023-02-06 DIAGNOSIS — E11621 Type 2 diabetes mellitus with foot ulcer: Secondary | ICD-10-CM

## 2023-02-06 DIAGNOSIS — I110 Hypertensive heart disease with heart failure: Secondary | ICD-10-CM | POA: Diagnosis not present

## 2023-02-06 DIAGNOSIS — I87311 Chronic venous hypertension (idiopathic) with ulcer of right lower extremity: Secondary | ICD-10-CM | POA: Diagnosis not present

## 2023-02-06 DIAGNOSIS — I2510 Atherosclerotic heart disease of native coronary artery without angina pectoris: Secondary | ICD-10-CM | POA: Diagnosis not present

## 2023-02-06 DIAGNOSIS — L97518 Non-pressure chronic ulcer of other part of right foot with other specified severity: Secondary | ICD-10-CM

## 2023-02-06 DIAGNOSIS — I509 Heart failure, unspecified: Secondary | ICD-10-CM | POA: Diagnosis not present

## 2023-02-06 DIAGNOSIS — Z7901 Long term (current) use of anticoagulants: Secondary | ICD-10-CM | POA: Diagnosis not present

## 2023-02-06 DIAGNOSIS — Z6841 Body Mass Index (BMI) 40.0 and over, adult: Secondary | ICD-10-CM | POA: Diagnosis not present

## 2023-02-08 ENCOUNTER — Ambulatory Visit (HOSPITAL_BASED_OUTPATIENT_CLINIC_OR_DEPARTMENT_OTHER): Payer: Medicare Other | Admitting: Internal Medicine

## 2023-02-13 ENCOUNTER — Ambulatory Visit (HOSPITAL_COMMUNITY)
Admission: RE | Admit: 2023-02-13 | Discharge: 2023-02-13 | Disposition: A | Discharge status: Home / Self Care | Payer: Medicare Other | Source: Ambulatory Visit | Attending: Internal Medicine | Admitting: Internal Medicine

## 2023-02-13 ENCOUNTER — Encounter (HOSPITAL_COMMUNITY): Payer: Self-pay

## 2023-02-13 DIAGNOSIS — I5022 Chronic systolic (congestive) heart failure: Secondary | ICD-10-CM | POA: Diagnosis not present

## 2023-02-13 LAB — BASIC METABOLIC PANEL
Anion gap: 12 (ref 5–15)
BUN: 33 mg/dL — ABNORMAL HIGH (ref 8–23)
CO2: 23 mmol/L (ref 22–32)
Calcium: 9.3 mg/dL (ref 8.9–10.3)
Chloride: 99 mmol/L (ref 98–111)
Creatinine, Ser: 1.35 mg/dL — ABNORMAL HIGH (ref 0.61–1.24)
GFR, Estimated: 58 mL/min — ABNORMAL LOW (ref >60)
Glucose, Bld: 106 mg/dL — ABNORMAL HIGH (ref 70–99)
Potassium: 5.3 mmol/L — ABNORMAL HIGH (ref 3.5–5.1)
Sodium: 134 mmol/L — ABNORMAL LOW (ref 135–145)

## 2023-02-14 ENCOUNTER — Encounter (HOSPITAL_COMMUNITY): Payer: Self-pay | Admitting: Cardiology

## 2023-02-14 ENCOUNTER — Telehealth (HOSPITAL_COMMUNITY): Payer: Self-pay | Admitting: Cardiology

## 2023-02-14 DIAGNOSIS — I5022 Chronic systolic (congestive) heart failure: Secondary | ICD-10-CM

## 2023-02-14 MED ORDER — SPIRONOLACTONE 25 MG PO TABS: 12.5000 mg | ORAL_TABLET | Freq: Every day | ORAL | 11 refills | Status: DC

## 2023-02-14 NOTE — Telephone Encounter (Signed)
Patient called.  Patient aware. Instructions sent via mychart as requested Repeat labs 2/10

## 2023-02-15 ENCOUNTER — Encounter (HOSPITAL_BASED_OUTPATIENT_CLINIC_OR_DEPARTMENT_OTHER): Payer: Medicare Other | Attending: Internal Medicine | Admitting: Internal Medicine

## 2023-02-15 DIAGNOSIS — I890 Lymphedema, not elsewhere classified: Secondary | ICD-10-CM | POA: Insufficient documentation

## 2023-02-15 DIAGNOSIS — E11621 Type 2 diabetes mellitus with foot ulcer: Secondary | ICD-10-CM | POA: Diagnosis not present

## 2023-02-15 DIAGNOSIS — S90811A Abrasion, right foot, initial encounter: Secondary | ICD-10-CM | POA: Diagnosis present

## 2023-02-15 DIAGNOSIS — L97518 Non-pressure chronic ulcer of other part of right foot with other specified severity: Secondary | ICD-10-CM

## 2023-02-15 DIAGNOSIS — I87311 Chronic venous hypertension (idiopathic) with ulcer of right lower extremity: Secondary | ICD-10-CM

## 2023-02-15 DIAGNOSIS — X58XXXA Exposure to other specified factors, initial encounter: Secondary | ICD-10-CM | POA: Insufficient documentation

## 2023-02-16 DIAGNOSIS — G4733 Obstructive sleep apnea (adult) (pediatric): Secondary | ICD-10-CM | POA: Diagnosis not present

## 2023-02-20 ENCOUNTER — Ambulatory Visit (HOSPITAL_COMMUNITY)
Admission: RE | Admit: 2023-02-20 | Discharge: 2023-02-20 | Disposition: A | Discharge status: Home / Self Care | Payer: Medicare Other | Source: Ambulatory Visit | Attending: Cardiology | Admitting: Cardiology

## 2023-02-20 DIAGNOSIS — I5022 Chronic systolic (congestive) heart failure: Secondary | ICD-10-CM | POA: Insufficient documentation

## 2023-02-20 LAB — BASIC METABOLIC PANEL
Anion gap: 13 (ref 5–15)
BUN: 35 mg/dL — ABNORMAL HIGH (ref 8–23)
CO2: 24 mmol/L (ref 22–32)
Calcium: 9.3 mg/dL (ref 8.9–10.3)
Chloride: 99 mmol/L (ref 98–111)
Creatinine, Ser: 1.31 mg/dL — ABNORMAL HIGH (ref 0.61–1.24)
GFR, Estimated: 60 mL/min — ABNORMAL LOW (ref >60)
Glucose, Bld: 108 mg/dL — ABNORMAL HIGH (ref 70–99)
Potassium: 4.2 mmol/L (ref 3.5–5.1)
Sodium: 136 mmol/L (ref 135–145)

## 2023-02-22 ENCOUNTER — Encounter (HOSPITAL_BASED_OUTPATIENT_CLINIC_OR_DEPARTMENT_OTHER): Payer: Medicare Other | Admitting: Internal Medicine

## 2023-02-22 DIAGNOSIS — I890 Lymphedema, not elsewhere classified: Secondary | ICD-10-CM | POA: Diagnosis not present

## 2023-02-22 DIAGNOSIS — E11621 Type 2 diabetes mellitus with foot ulcer: Secondary | ICD-10-CM | POA: Diagnosis not present

## 2023-02-22 DIAGNOSIS — X58XXXA Exposure to other specified factors, initial encounter: Secondary | ICD-10-CM | POA: Diagnosis not present

## 2023-02-22 DIAGNOSIS — I87311 Chronic venous hypertension (idiopathic) with ulcer of right lower extremity: Secondary | ICD-10-CM

## 2023-02-22 DIAGNOSIS — L97518 Non-pressure chronic ulcer of other part of right foot with other specified severity: Secondary | ICD-10-CM

## 2023-02-22 DIAGNOSIS — S90811A Abrasion, right foot, initial encounter: Secondary | ICD-10-CM | POA: Diagnosis not present

## 2023-03-01 ENCOUNTER — Ambulatory Visit (HOSPITAL_BASED_OUTPATIENT_CLINIC_OR_DEPARTMENT_OTHER): Payer: Medicare Other | Admitting: Internal Medicine

## 2023-03-06 ENCOUNTER — Encounter (HOSPITAL_COMMUNITY): Payer: Medicare Other

## 2023-03-06 ENCOUNTER — Encounter (HOSPITAL_BASED_OUTPATIENT_CLINIC_OR_DEPARTMENT_OTHER): Payer: Medicare Other | Admitting: Internal Medicine

## 2023-03-06 ENCOUNTER — Ambulatory Visit (HOSPITAL_COMMUNITY): Payer: Medicare Other

## 2023-03-06 ENCOUNTER — Encounter (HOSPITAL_COMMUNITY): Payer: Self-pay

## 2023-03-06 DIAGNOSIS — S91104D Unspecified open wound of right lesser toe(s) without damage to nail, subsequent encounter: Secondary | ICD-10-CM

## 2023-03-06 DIAGNOSIS — S90811A Abrasion, right foot, initial encounter: Secondary | ICD-10-CM | POA: Diagnosis not present

## 2023-03-06 DIAGNOSIS — L97518 Non-pressure chronic ulcer of other part of right foot with other specified severity: Secondary | ICD-10-CM

## 2023-03-06 DIAGNOSIS — E11621 Type 2 diabetes mellitus with foot ulcer: Secondary | ICD-10-CM

## 2023-03-06 DIAGNOSIS — I87311 Chronic venous hypertension (idiopathic) with ulcer of right lower extremity: Secondary | ICD-10-CM | POA: Diagnosis not present

## 2023-03-06 DIAGNOSIS — X58XXXA Exposure to other specified factors, initial encounter: Secondary | ICD-10-CM | POA: Diagnosis not present

## 2023-03-06 DIAGNOSIS — I890 Lymphedema, not elsewhere classified: Secondary | ICD-10-CM | POA: Diagnosis not present

## 2023-03-06 DIAGNOSIS — S91001D Unspecified open wound, right ankle, subsequent encounter: Secondary | ICD-10-CM | POA: Diagnosis not present

## 2023-03-08 ENCOUNTER — Ambulatory Visit (HOSPITAL_BASED_OUTPATIENT_CLINIC_OR_DEPARTMENT_OTHER): Payer: Medicare Other | Admitting: Internal Medicine

## 2023-03-15 ENCOUNTER — Encounter (HOSPITAL_BASED_OUTPATIENT_CLINIC_OR_DEPARTMENT_OTHER): Payer: Medicare Other | Attending: Internal Medicine | Admitting: Internal Medicine

## 2023-03-15 DIAGNOSIS — L97518 Non-pressure chronic ulcer of other part of right foot with other specified severity: Secondary | ICD-10-CM | POA: Insufficient documentation

## 2023-03-15 DIAGNOSIS — S91001D Unspecified open wound, right ankle, subsequent encounter: Secondary | ICD-10-CM | POA: Diagnosis not present

## 2023-03-15 DIAGNOSIS — S91104D Unspecified open wound of right lesser toe(s) without damage to nail, subsequent encounter: Secondary | ICD-10-CM

## 2023-03-15 DIAGNOSIS — I87311 Chronic venous hypertension (idiopathic) with ulcer of right lower extremity: Secondary | ICD-10-CM | POA: Insufficient documentation

## 2023-03-15 DIAGNOSIS — Z89422 Acquired absence of other left toe(s): Secondary | ICD-10-CM | POA: Diagnosis not present

## 2023-03-15 DIAGNOSIS — E11621 Type 2 diabetes mellitus with foot ulcer: Secondary | ICD-10-CM | POA: Diagnosis not present

## 2023-03-16 DIAGNOSIS — G4733 Obstructive sleep apnea (adult) (pediatric): Secondary | ICD-10-CM | POA: Diagnosis not present

## 2023-03-18 ENCOUNTER — Other Ambulatory Visit (HOSPITAL_COMMUNITY): Payer: Self-pay | Admitting: Family Medicine

## 2023-03-20 ENCOUNTER — Ambulatory Visit (INDEPENDENT_AMBULATORY_CARE_PROVIDER_SITE_OTHER): Payer: Medicare Other

## 2023-03-20 DIAGNOSIS — I442 Atrioventricular block, complete: Secondary | ICD-10-CM

## 2023-03-21 ENCOUNTER — Encounter: Payer: Self-pay | Admitting: Internal Medicine

## 2023-03-21 LAB — CUP PACEART REMOTE DEVICE CHECK
Battery Remaining Longevity: 70 mo
Battery Voltage: 2.99 V
Brady Statistic AP VP Percent: 99.74 %
Brady Statistic AP VS Percent: 0 %
Brady Statistic AS VP Percent: 0.06 %
Brady Statistic AS VS Percent: 0.2 %
Brady Statistic RA Percent Paced: 98.44 %
Brady Statistic RV Percent Paced: 98.54 %
Date Time Interrogation Session: 20250310001705
HighPow Impedance: 91 Ohm
Implantable Lead Connection Status: 753985
Implantable Lead Connection Status: 753985
Implantable Lead Implant Date: 20230823
Implantable Lead Implant Date: 20230823
Implantable Lead Location: 753859
Implantable Lead Location: 753860
Implantable Lead Model: 3830
Implantable Lead Model: 6935
Implantable Pulse Generator Implant Date: 20230823
Lead Channel Impedance Value: 304 Ohm
Lead Channel Impedance Value: 399 Ohm
Lead Channel Impedance Value: 437 Ohm
Lead Channel Sensing Intrinsic Amplitude: 13.125 mV
Lead Channel Sensing Intrinsic Amplitude: 13.125 mV
Lead Channel Sensing Intrinsic Amplitude: 4.125 mV
Lead Channel Sensing Intrinsic Amplitude: 4.125 mV
Lead Channel Setting Pacing Amplitude: 1.5 V
Lead Channel Setting Pacing Amplitude: 2.5 V
Lead Channel Setting Pacing Pulse Width: 0.4 ms
Lead Channel Setting Sensing Sensitivity: 0.3 mV
Zone Setting Status: 755011

## 2023-03-22 ENCOUNTER — Encounter (HOSPITAL_BASED_OUTPATIENT_CLINIC_OR_DEPARTMENT_OTHER): Payer: Medicare Other | Admitting: Internal Medicine

## 2023-03-22 DIAGNOSIS — Z89422 Acquired absence of other left toe(s): Secondary | ICD-10-CM | POA: Diagnosis not present

## 2023-03-22 DIAGNOSIS — S90811A Abrasion, right foot, initial encounter: Secondary | ICD-10-CM | POA: Diagnosis not present

## 2023-03-22 DIAGNOSIS — E11621 Type 2 diabetes mellitus with foot ulcer: Secondary | ICD-10-CM | POA: Diagnosis not present

## 2023-03-22 DIAGNOSIS — L97518 Non-pressure chronic ulcer of other part of right foot with other specified severity: Secondary | ICD-10-CM

## 2023-03-22 DIAGNOSIS — I87311 Chronic venous hypertension (idiopathic) with ulcer of right lower extremity: Secondary | ICD-10-CM | POA: Diagnosis not present

## 2023-03-29 ENCOUNTER — Encounter (HOSPITAL_BASED_OUTPATIENT_CLINIC_OR_DEPARTMENT_OTHER): Payer: Medicare Other | Admitting: Internal Medicine

## 2023-03-29 DIAGNOSIS — E11621 Type 2 diabetes mellitus with foot ulcer: Secondary | ICD-10-CM | POA: Diagnosis not present

## 2023-03-29 DIAGNOSIS — L97518 Non-pressure chronic ulcer of other part of right foot with other specified severity: Secondary | ICD-10-CM

## 2023-03-29 DIAGNOSIS — S91001D Unspecified open wound, right ankle, subsequent encounter: Secondary | ICD-10-CM

## 2023-03-29 DIAGNOSIS — S91104D Unspecified open wound of right lesser toe(s) without damage to nail, subsequent encounter: Secondary | ICD-10-CM | POA: Diagnosis not present

## 2023-03-29 DIAGNOSIS — S90811A Abrasion, right foot, initial encounter: Secondary | ICD-10-CM

## 2023-03-30 ENCOUNTER — Inpatient Hospital Stay (HOSPITAL_COMMUNITY)
Admission: EM | Admit: 2023-03-30 | Discharge: 2023-04-03 | DRG: 291 | Disposition: A | Discharge status: Home / Self Care | Attending: Internal Medicine | Admitting: Internal Medicine

## 2023-03-30 ENCOUNTER — Other Ambulatory Visit: Payer: Self-pay

## 2023-03-30 ENCOUNTER — Emergency Department (HOSPITAL_COMMUNITY)

## 2023-03-30 ENCOUNTER — Encounter (HOSPITAL_COMMUNITY): Payer: Self-pay | Admitting: *Deleted

## 2023-03-30 DIAGNOSIS — Z794 Long term (current) use of insulin: Secondary | ICD-10-CM

## 2023-03-30 DIAGNOSIS — J9601 Acute respiratory failure with hypoxia: Secondary | ICD-10-CM | POA: Diagnosis not present

## 2023-03-30 DIAGNOSIS — I5021 Acute systolic (congestive) heart failure: Secondary | ICD-10-CM | POA: Diagnosis not present

## 2023-03-30 DIAGNOSIS — I5023 Acute on chronic systolic (congestive) heart failure: Secondary | ICD-10-CM | POA: Diagnosis present

## 2023-03-30 DIAGNOSIS — I5082 Biventricular heart failure: Secondary | ICD-10-CM

## 2023-03-30 DIAGNOSIS — C8590 Non-Hodgkin lymphoma, unspecified, unspecified site: Secondary | ICD-10-CM | POA: Diagnosis not present

## 2023-03-30 DIAGNOSIS — E871 Hypo-osmolality and hyponatremia: Secondary | ICD-10-CM | POA: Diagnosis present

## 2023-03-30 DIAGNOSIS — Z860101 Personal history of adenomatous and serrated colon polyps: Secondary | ICD-10-CM

## 2023-03-30 DIAGNOSIS — N179 Acute kidney failure, unspecified: Secondary | ICD-10-CM | POA: Diagnosis present

## 2023-03-30 DIAGNOSIS — R001 Bradycardia, unspecified: Secondary | ICD-10-CM

## 2023-03-30 DIAGNOSIS — Z880 Allergy status to penicillin: Secondary | ICD-10-CM

## 2023-03-30 DIAGNOSIS — R609 Edema, unspecified: Secondary | ICD-10-CM | POA: Diagnosis not present

## 2023-03-30 DIAGNOSIS — I442 Atrioventricular block, complete: Secondary | ICD-10-CM | POA: Diagnosis present

## 2023-03-30 DIAGNOSIS — I2510 Atherosclerotic heart disease of native coronary artery without angina pectoris: Secondary | ICD-10-CM | POA: Diagnosis not present

## 2023-03-30 DIAGNOSIS — Z1152 Encounter for screening for COVID-19: Secondary | ICD-10-CM

## 2023-03-30 DIAGNOSIS — Z95810 Presence of automatic (implantable) cardiac defibrillator: Secondary | ICD-10-CM

## 2023-03-30 DIAGNOSIS — R0689 Other abnormalities of breathing: Secondary | ICD-10-CM | POA: Diagnosis not present

## 2023-03-30 DIAGNOSIS — Z888 Allergy status to other drugs, medicaments and biological substances status: Secondary | ICD-10-CM

## 2023-03-30 DIAGNOSIS — Z950 Presence of cardiac pacemaker: Secondary | ICD-10-CM | POA: Diagnosis not present

## 2023-03-30 DIAGNOSIS — R059 Cough, unspecified: Secondary | ICD-10-CM | POA: Diagnosis not present

## 2023-03-30 DIAGNOSIS — I5043 Acute on chronic combined systolic (congestive) and diastolic (congestive) heart failure: Secondary | ICD-10-CM | POA: Diagnosis not present

## 2023-03-30 DIAGNOSIS — I4821 Permanent atrial fibrillation: Secondary | ICD-10-CM | POA: Diagnosis present

## 2023-03-30 DIAGNOSIS — E876 Hypokalemia: Secondary | ICD-10-CM | POA: Diagnosis not present

## 2023-03-30 DIAGNOSIS — I272 Pulmonary hypertension, unspecified: Secondary | ICD-10-CM | POA: Diagnosis present

## 2023-03-30 DIAGNOSIS — I2583 Coronary atherosclerosis due to lipid rich plaque: Secondary | ICD-10-CM | POA: Diagnosis not present

## 2023-03-30 DIAGNOSIS — R0600 Dyspnea, unspecified: Secondary | ICD-10-CM | POA: Diagnosis not present

## 2023-03-30 DIAGNOSIS — R112 Nausea with vomiting, unspecified: Secondary | ICD-10-CM | POA: Diagnosis not present

## 2023-03-30 DIAGNOSIS — E785 Hyperlipidemia, unspecified: Secondary | ICD-10-CM | POA: Diagnosis not present

## 2023-03-30 DIAGNOSIS — E11621 Type 2 diabetes mellitus with foot ulcer: Secondary | ICD-10-CM | POA: Diagnosis not present

## 2023-03-30 DIAGNOSIS — Z89422 Acquired absence of other left toe(s): Secondary | ICD-10-CM

## 2023-03-30 DIAGNOSIS — Z79899 Other long term (current) drug therapy: Secondary | ICD-10-CM

## 2023-03-30 DIAGNOSIS — Z8616 Personal history of COVID-19: Secondary | ICD-10-CM

## 2023-03-30 DIAGNOSIS — I428 Other cardiomyopathies: Secondary | ICD-10-CM | POA: Diagnosis present

## 2023-03-30 DIAGNOSIS — E1169 Type 2 diabetes mellitus with other specified complication: Secondary | ICD-10-CM | POA: Diagnosis not present

## 2023-03-30 DIAGNOSIS — R0902 Hypoxemia: Secondary | ICD-10-CM | POA: Diagnosis not present

## 2023-03-30 DIAGNOSIS — M109 Gout, unspecified: Secondary | ICD-10-CM | POA: Diagnosis present

## 2023-03-30 DIAGNOSIS — I110 Hypertensive heart disease with heart failure: Secondary | ICD-10-CM | POA: Diagnosis not present

## 2023-03-30 DIAGNOSIS — I959 Hypotension, unspecified: Secondary | ICD-10-CM | POA: Diagnosis present

## 2023-03-30 DIAGNOSIS — G4733 Obstructive sleep apnea (adult) (pediatric): Secondary | ICD-10-CM | POA: Diagnosis not present

## 2023-03-30 DIAGNOSIS — I509 Heart failure, unspecified: Secondary | ICD-10-CM | POA: Diagnosis not present

## 2023-03-30 DIAGNOSIS — Z8249 Family history of ischemic heart disease and other diseases of the circulatory system: Secondary | ICD-10-CM

## 2023-03-30 DIAGNOSIS — I482 Chronic atrial fibrillation, unspecified: Secondary | ICD-10-CM | POA: Diagnosis not present

## 2023-03-30 DIAGNOSIS — I2585 Chronic coronary microvascular dysfunction: Secondary | ICD-10-CM

## 2023-03-30 DIAGNOSIS — E66813 Obesity, class 3: Secondary | ICD-10-CM | POA: Diagnosis present

## 2023-03-30 DIAGNOSIS — Z7901 Long term (current) use of anticoagulants: Secondary | ICD-10-CM

## 2023-03-30 DIAGNOSIS — Z91040 Latex allergy status: Secondary | ICD-10-CM

## 2023-03-30 DIAGNOSIS — R0989 Other specified symptoms and signs involving the circulatory and respiratory systems: Secondary | ICD-10-CM | POA: Diagnosis not present

## 2023-03-30 DIAGNOSIS — Z87891 Personal history of nicotine dependence: Secondary | ICD-10-CM

## 2023-03-30 DIAGNOSIS — Z6841 Body Mass Index (BMI) 40.0 and over, adult: Secondary | ICD-10-CM | POA: Diagnosis not present

## 2023-03-30 DIAGNOSIS — Z7982 Long term (current) use of aspirin: Secondary | ICD-10-CM

## 2023-03-30 LAB — CBC WITH DIFFERENTIAL/PLATELET
Abs Immature Granulocytes: 0.08 10*3/uL — ABNORMAL HIGH (ref 0.00–0.07)
Basophils Absolute: 0.1 10*3/uL (ref 0.0–0.1)
Basophils Relative: 1 %
Eosinophils Absolute: 0 10*3/uL (ref 0.0–0.5)
Eosinophils Relative: 0 %
HCT: 44.6 % (ref 39.0–52.0)
Hemoglobin: 14.7 g/dL (ref 13.0–17.0)
Immature Granulocytes: 1 %
Lymphocytes Relative: 4 %
Lymphs Abs: 0.4 10*3/uL — ABNORMAL LOW (ref 0.7–4.0)
MCH: 34.2 pg — ABNORMAL HIGH (ref 26.0–34.0)
MCHC: 33 g/dL (ref 30.0–36.0)
MCV: 103.7 fL — ABNORMAL HIGH (ref 80.0–100.0)
Monocytes Absolute: 0.7 10*3/uL (ref 0.1–1.0)
Monocytes Relative: 7 %
Neutro Abs: 9.1 10*3/uL — ABNORMAL HIGH (ref 1.7–7.7)
Neutrophils Relative %: 87 %
Platelets: 142 10*3/uL — ABNORMAL LOW (ref 150–400)
RBC: 4.3 MIL/uL (ref 4.22–5.81)
RDW: 16.7 % — ABNORMAL HIGH (ref 11.5–15.5)
WBC: 10.5 10*3/uL (ref 4.0–10.5)
nRBC: 0 % (ref 0.0–0.2)

## 2023-03-30 LAB — COMPREHENSIVE METABOLIC PANEL
ALT: 14 U/L (ref 0–44)
AST: 23 U/L (ref 15–41)
Albumin: 3.3 g/dL — ABNORMAL LOW (ref 3.5–5.0)
Alkaline Phosphatase: 104 U/L (ref 38–126)
Anion gap: 11 (ref 5–15)
BUN: 18 mg/dL (ref 8–23)
CO2: 24 mmol/L (ref 22–32)
Calcium: 8.6 mg/dL — ABNORMAL LOW (ref 8.9–10.3)
Chloride: 99 mmol/L (ref 98–111)
Creatinine, Ser: 1.18 mg/dL (ref 0.61–1.24)
GFR, Estimated: >60 mL/min (ref >60)
Glucose, Bld: 137 mg/dL — ABNORMAL HIGH (ref 70–99)
Potassium: 3.9 mmol/L (ref 3.5–5.1)
Sodium: 134 mmol/L — ABNORMAL LOW (ref 135–145)
Total Bilirubin: 1.3 mg/dL — ABNORMAL HIGH (ref 0.0–1.2)
Total Protein: 6.8 g/dL (ref 6.5–8.1)

## 2023-03-30 LAB — TROPONIN I (HIGH SENSITIVITY)
Troponin I (High Sensitivity): 55 ng/L — ABNORMAL HIGH (ref <18)
Troponin I (High Sensitivity): 72 ng/L — ABNORMAL HIGH (ref <18)
Troponin I (High Sensitivity): 83 ng/L — ABNORMAL HIGH (ref <18)
Troponin I (High Sensitivity): 81 ng/L — ABNORMAL HIGH (ref <18)

## 2023-03-30 LAB — HEMOGLOBIN A1C
Hgb A1c MFr Bld: 5.8 % — ABNORMAL HIGH (ref 4.8–5.6)
Mean Plasma Glucose: 119.76 mg/dL

## 2023-03-30 LAB — I-STAT CG4 LACTIC ACID, ED: Lactic Acid, Venous: 1.4 mmol/L (ref 0.5–1.9)

## 2023-03-30 LAB — CBG MONITORING, ED: Glucose-Capillary: 96 mg/dL (ref 70–99)

## 2023-03-30 LAB — RESP PANEL BY RT-PCR (RSV, FLU A&B, COVID)  RVPGX2
Influenza A by PCR: NEGATIVE
Influenza B by PCR: NEGATIVE
Resp Syncytial Virus by PCR: NEGATIVE
SARS Coronavirus 2 by RT PCR: NEGATIVE

## 2023-03-30 LAB — BRAIN NATRIURETIC PEPTIDE: B Natriuretic Peptide: 670.9 pg/mL — ABNORMAL HIGH (ref 0.0–100.0)

## 2023-03-30 MED ORDER — VITAMIN C 500 MG PO TABS
500.0000 mg | ORAL_TABLET | Freq: Two times a day (BID) | ORAL | Status: DC
  Administered 2023-03-30 – 2023-04-03 (×9): 500 mg via ORAL
  Filled 2023-03-30 (×9): qty 1

## 2023-03-30 MED ORDER — SODIUM CHLORIDE 0.9% FLUSH: 3.0000 mL | INTRAVENOUS | Status: DC | PRN

## 2023-03-30 MED ORDER — OMEGA-3-ACID ETHYL ESTERS 1 G PO CAPS
1.0000 g | ORAL_CAPSULE | Freq: Every day | ORAL | Status: DC
  Administered 2023-03-30 – 2023-04-03 (×5): 1 g via ORAL
  Filled 2023-03-30 (×6): qty 1

## 2023-03-30 MED ORDER — APIXABAN 5 MG PO TABS
5.0000 mg | ORAL_TABLET | Freq: Two times a day (BID) | ORAL | Status: DC
  Administered 2023-03-30 – 2023-04-03 (×9): 5 mg via ORAL
  Filled 2023-03-30 (×9): qty 1

## 2023-03-30 MED ORDER — ATORVASTATIN CALCIUM 10 MG PO TABS
10.0000 mg | ORAL_TABLET | Freq: Every day | ORAL | Status: DC
  Administered 2023-03-30 – 2023-04-03 (×5): 10 mg via ORAL
  Filled 2023-03-30 (×5): qty 1

## 2023-03-30 MED ORDER — SODIUM CHLORIDE 0.9 % IV SOLN: 2.0000 g | INTRAVENOUS | Status: DC

## 2023-03-30 MED ORDER — OMEGA 3 340 MG PO CPDR: 340.0000 mg | DELAYED_RELEASE_CAPSULE | Freq: Two times a day (BID) | ORAL | Status: DC

## 2023-03-30 MED ORDER — SODIUM CHLORIDE 0.9% FLUSH
3.0000 mL | Freq: Two times a day (BID) | INTRAVENOUS | Status: DC
  Administered 2023-03-30 – 2023-04-03 (×5): 3 mL via INTRAVENOUS

## 2023-03-30 MED ORDER — ALBUTEROL SULFATE (2.5 MG/3ML) 0.083% IN NEBU
2.5000 mg | INHALATION_SOLUTION | RESPIRATORY_TRACT | Status: DC | PRN
  Administered 2023-03-30: 2.5 mg via RESPIRATORY_TRACT
  Filled 2023-03-30: qty 3

## 2023-03-30 MED ORDER — DAPAGLIFLOZIN PROPANEDIOL 10 MG PO TABS
10.0000 mg | ORAL_TABLET | Freq: Every day | ORAL | Status: DC
Start: 2023-03-30 — End: 2023-04-03
  Administered 2023-03-30 – 2023-04-03 (×5): 10 mg via ORAL
  Filled 2023-03-30 (×5): qty 1

## 2023-03-30 MED ORDER — SODIUM CHLORIDE 0.9 % IV SOLN
2.0000 g | Freq: Once | INTRAVENOUS | Status: AC
  Administered 2023-03-30: 2 g via INTRAVENOUS
  Filled 2023-03-30: qty 20

## 2023-03-30 MED ORDER — SERTRALINE HCL 50 MG PO TABS
50.0000 mg | ORAL_TABLET | Freq: Every day | ORAL | Status: DC
  Administered 2023-03-30 – 2023-04-03 (×5): 50 mg via ORAL
  Filled 2023-03-30 (×5): qty 1

## 2023-03-30 MED ORDER — ACETAMINOPHEN 325 MG PO TABS
650.0000 mg | ORAL_TABLET | ORAL | Status: DC | PRN
  Administered 2023-04-01 – 2023-04-02 (×2): 650 mg via ORAL
  Filled 2023-03-30 (×2): qty 2

## 2023-03-30 MED ORDER — SODIUM CHLORIDE 0.9 % IV SOLN: 2.0000 g | Freq: Once | INTRAVENOUS | Status: DC

## 2023-03-30 MED ORDER — PANTOPRAZOLE SODIUM 40 MG PO TBEC
40.0000 mg | DELAYED_RELEASE_TABLET | Freq: Every day | ORAL | Status: DC
  Administered 2023-03-30 – 2023-04-03 (×5): 40 mg via ORAL
  Filled 2023-03-30 (×5): qty 1

## 2023-03-30 MED ORDER — ACETAMINOPHEN 325 MG PO TABS: 650.0000 mg | ORAL_TABLET | Freq: Four times a day (QID) | ORAL | Status: DC | PRN

## 2023-03-30 MED ORDER — INSULIN ASPART 100 UNIT/ML IJ SOLN: 0.0000 [IU] | Freq: Three times a day (TID) | INTRAMUSCULAR | Status: DC

## 2023-03-30 MED ORDER — SODIUM CHLORIDE 0.9 % IV SOLN
250.0000 mL | INTRAVENOUS | Status: AC | PRN
Start: 2023-03-30 — End: 2023-03-31

## 2023-03-30 MED ORDER — VITAMIN B-12 1000 MCG PO TABS
1000.0000 ug | ORAL_TABLET | Freq: Every day | ORAL | Status: DC
  Administered 2023-03-30 – 2023-04-03 (×5): 1000 ug via ORAL
  Filled 2023-03-30 (×5): qty 1

## 2023-03-30 MED ORDER — SODIUM CHLORIDE 0.9 % IV SOLN
3.0000 g | Freq: Four times a day (QID) | INTRAVENOUS | Status: DC
  Administered 2023-03-30 – 2023-03-31 (×4): 3 g via INTRAVENOUS
  Filled 2023-03-30 (×4): qty 8

## 2023-03-30 MED ORDER — SODIUM CHLORIDE 0.9 % IV BOLUS: 500.0000 mL | Freq: Once | INTRAVENOUS | Status: DC

## 2023-03-30 MED ORDER — ONDANSETRON HCL 4 MG/2ML IJ SOLN: 4.0000 mg | Freq: Four times a day (QID) | INTRAMUSCULAR | Status: DC | PRN

## 2023-03-30 MED ORDER — SODIUM CHLORIDE 0.9 % IV BOLUS
500.0000 mL | Freq: Once | INTRAVENOUS | Status: AC
  Administered 2023-03-30: 500 mL via INTRAVENOUS

## 2023-03-30 MED ORDER — VITAMIN D 25 MCG (1000 UNIT) PO TABS
1000.0000 [IU] | ORAL_TABLET | Freq: Every day | ORAL | Status: DC
  Administered 2023-03-30 – 2023-04-03 (×5): 1000 [IU] via ORAL
  Filled 2023-03-30 (×5): qty 1

## 2023-03-30 MED ORDER — FUROSEMIDE 10 MG/ML IJ SOLN
80.0000 mg | Freq: Two times a day (BID) | INTRAMUSCULAR | Status: DC
  Administered 2023-03-30 – 2023-03-31 (×2): 80 mg via INTRAVENOUS
  Filled 2023-03-30 (×2): qty 8

## 2023-03-30 MED ORDER — SODIUM CHLORIDE 0.9 % IV SOLN
500.0000 mg | Freq: Once | INTRAVENOUS | Status: AC
  Administered 2023-03-30: 500 mg via INTRAVENOUS
  Filled 2023-03-30: qty 5

## 2023-03-30 MED ORDER — COLCHICINE 0.6 MG PO TABS
0.6000 mg | ORAL_TABLET | Freq: Two times a day (BID) | ORAL | Status: DC | PRN
  Administered 2023-04-01: 0.6 mg via ORAL
  Filled 2023-03-30: qty 1

## 2023-03-30 NOTE — Consult Note (Addendum)
 Advanced Heart Failure Team Consult Note   Primary Physician: [name]., [name] Cardiologist:  [name] Batty, [name] HF [name]: [name] [name]  Reason for Consultation: A/C HFrEF  HPI:    [name] is seen today for evaluation of A/C HFrEF at the request of [name].   [name] is a 68 year old with a history of chronic biventricular HFrEF, chronic  aifb, OSA, HTN, HLD, HTN, DMII, OSA, ILD, tobacco abuse, ILD, CAD and chemo RCHOP for lymphoma in 2011.   EF 45-50% in 2020 but down to 30% in 2023.   Admitted re-syncope, ECG showed AF w/ SVR, HR 28 BPM, RBBB, had several 2 sec pauses. Beta blocker and other GDMT held with AKI and hypotension. He developed worsening periods of high-grade AV block/atrial fibrillation with slow ventricular response and lengthy pauses w/ hypotension. Underwent TVP with improvement in symptoms. R/LHC showed diffuse nonobstructive CAD, bi-V failure R>L, mild to moderate pulmonary venous hypertension. EP consulted and he underwent BiV ICD. Hospitalization complicated by chronic venous stasis wounds. Able to transition to GDMT as AKI resolved. Discharged home with [facility] PT/OT, weight 328 lbs.   Presented to ED via EMS. Complaining of cough, sob, N/Vand flu like symptoms. Progressive SOB for the last few days. Taking all medications. CXR- no acute cardiopulmonary abnormality. Pertinent labs incuded: BNP 670, respiratory panel negative, HS Trop 55>72, WBC 10.5 , Hgb 14.7, K 3.9, and creatinine 1.18. Given azithromycin and ceftriaxone. SBP soft. HF meds held.   Blood Cx pending.   I personally interrogated Medtronic device- No VT. No A Fib. Activity 1-2 hours per day. Optivol - Fluid index well above threshold since 03/06/23. Thoracic Imepance down.   Home Medications Prior to Admission medications   Medication Sig Start Date End Date Taking? Authorizing Provider  acetaminophen (TYLENOL) 325 MG tablet Take 650 mg by mouth every 6 (six) hours as needed for moderate  pain.     Provider, Historical, [name]  apixaban (ELIQUIS) 5 MG TABS tablet Take 1 tablet (5 mg total) by mouth 2 (two) times daily. NEEDS FOLLOW UP APPOINTMENT 11/15/22   [name], [name] Buckles, [name]  ascorbic acid (VITAMIN C) 500 MG tablet Take 1 tablet (500 mg total) by mouth 3 (three) times daily. Patient taking differently: Take 500 mg by mouth 2 (two) times daily. 08/26/20   Angiulli, [name] Rossetti, PA-C  atorvastatin (LIPITOR) 40 MG tablet TAKE 1 TABLET BY MOUTH EVERY DAY 10/04/21   [name], [name] Buckles, [name]  carvedilol (COREG) 3.125 MG tablet Take 1 tablet (3.125 mg total) by mouth 2 (two) times daily. NEEDS FOLLOW UP APPOINTMENT FOR MORE REFILLS 10/07/22   [name]  cholecalciferol (VITAMIN D) 25 MCG (1000 UNIT) tablet Take 1 tablet (1,000 Units total) by mouth daily. 08/26/20   Angiulli, [name] Rossetti, PA-C  colchicine 0.6 MG tablet Take 0.6 mg by mouth 2 (two) times daily as needed.    Provider, Historical, [name]  [facility] ASPIRIN ADULT LOW DOSE 81 MG chewable tablet CHEW 1 TABLET BY MOUTH DAILY. 08/29/22   [name], [name] Buckles, [name]  dapagliflozin propanediol (FARXIGA) 10 MG TABS tablet Take 1 tablet (10 mg total) by mouth daily. 03/25/22   [name], [name] Buckles, [name]  Omega 3 340 MG CPDR Take 1 capsule (340 mg total) by mouth 2 (two) times daily. Patient taking differently: Take 340 mg by mouth daily. 08/26/20   Angiulli, [name] Rossetti, PA-C  omeprazole (PRILOSEC) 40 MG capsule Take 1 capsule (40 mg  total) by mouth daily. 08/26/20   Angiulli, [name] Rossetti, PA-C  sacubitril-valsartan (ENTRESTO) 24-26 MG Take 1 tablet by mouth 2 (two) times daily. 01/30/23   [name] Bleacher, NP  sertraline (ZOLOFT) 50 MG tablet Take 1 tablet (50 mg total) by mouth daily. 08/26/20   Angiulli, [name] Rossetti, PA-C  spironolactone (ALDACTONE) 25 MG tablet Take 0.5 tablets (12.5 mg total) by mouth daily. 02/17/23   [name] Bleacher, NP  torsemide (DEMADEX) 20 MG tablet Patient is taking 2 tablets by mouth daily. 01/30/23   [name] Bleacher, NP  vitamin B-12  (CYANOCOBALAMIN) 1000 MCG tablet Take 1 tablet (1,000 mcg total) by mouth daily. 08/26/20   Angiulli, [name] Rossetti, PA-C  sodium chloride (OCEAN) 0.65 % SOLN nasal spray Place 2 sprays into both nostrils as needed for congestion. 04/05/17 10/05/20  [name] Cree, PA-C    Past Medical History: Past Medical History:  Diagnosis Date   Acute systolic HF (heart failure) (HCC)    Arthritis    lt foot, hips knees    Atrial fibrillation (HCC)    Diabetes mellitus, new onset (HCC)    Diverticulosis    Dyslipidemia    Gout attack 01/16/2012   Hypertension    Internal hemorrhoids    Lymphoma (HCC)    remission for about 2 years, chemo 3 years prior   Pneumonia due to COVID-19 virus 07/2018   Pulmonary hypertension (HCC)    Sepsis (HCC) 02/2017   secondary to influenza; requiring trach   Tubular adenoma of colon    Venous stasis ulcers (HCC)     Past Surgical History: Past Surgical History:  Procedure Laterality Date   ABDOMINAL AORTOGRAM W/LOWER EXTREMITY N/A 08/14/2020   Procedure: ABDOMINAL AORTOGRAM W/LOWER EXTREMITY;  Surgeon: [name] Kerns, [name];  [name];  Service: Cardiovascular;  Laterality: N/A;   AMPUTATION TOE Left 05/08/2019   Procedure: AMPUTATION LEFT GREAT  TOE;  Surgeon: [name], DPM;  Location: [name];  Service: Podiatry;  Laterality: Left;   ARTERIAL LINE INSERTION Right 08/26/2021   Procedure: ARTERIAL LINE INSERTION;  Surgeon: [name] Pyo, [name];  [facility]: [name];  Service: Cardiovascular;  Laterality: Right;  rt radial   BIV ICD INSERTION CRT-D N/A 09/01/2021   Procedure: BIV ICD INSERTION CRT-D;  Surgeon: [name] Maw, [name];  Location: [name];  Service: Cardiovascular;  Laterality: N/A;   BRONCHOSCOPY     ESOPHAGOGASTRODUODENOSCOPY ENDOSCOPY     [name] [name] HERNIA REPAIR Left    2001   IR REMOVAL [name]  02/03/2020   PORTA CATH INSERTION  2011   RIGHT/LEFT HEART CATH AND  CORONARY ANGIOGRAPHY N/A 08/27/2021   Procedure: RIGHT/LEFT HEART CATH AND CORONARY ANGIOGRAPHY;  Surgeon: Dolores Patty, [name];  [name];  Service: Cardiovascular;  Laterality: N/A;   TEMPORARY PACEMAKER N/A 08/26/2021   Procedure: TEMPORARY PACEMAKER;  Surgeon: [name] Pyo, [name];  [facility]: [name];  Service: Cardiovascular;  Laterality: N/A;   TRACHEOSTOMY  03/2017   with decannulation   TRANSMETATARSAL AMPUTATION Left 08/05/2020   Procedure: TRANSMETATARSAL AMPUTATION;  Surgeon: [name], DPM;  Location: [facility];  Service: Podiatry;  Laterality: Left;    Family History: Family History  Problem Relation Age of Onset   Dementia Brother 17       frontal lobe    Dementia Mother    Arthritis Mother  Hypertension Mother    CVA Sister 30   Colon cancer Neg Hx    Pancreatic cancer Neg Hx    Stomach cancer Neg Hx    Rectal cancer Neg Hx    Liver cancer Neg Hx     Social History: Social History   Socioeconomic History   Marital status: Divorced    Spouse name: Not on file   Number of children: Not on file   Years of education: Not on file   Highest education level: Not on file  Occupational History   Occupation: cosmetologist  Tobacco Use   Smoking status: Former    Current packs/day: 0.00    Average packs/day: 0.5 packs/day for 20.0 years (10.0 ttl pk-yrs)    Types: Cigarettes    Start date: 01/10/1989    Quit date: 01/10/2009    Years since quitting: 14.2   Smokeless tobacco: Never  Vaping Use   Vaping status: Never Used  Substance and Sexual Activity   Alcohol use: Not Currently    Comment: 2-3 times per week   Drug use: No   Sexual activity: Not on file  Other Topics Concern   Not on file  Social History Narrative   Not on file   Social Drivers of Health   Financial Resource Strain: Not on file  Food Insecurity: No Food Insecurity (03/30/2023)   Hunger Vital Sign    Worried About Running Out of Food in the Last Year:  Never true    Ran Out of Food in the Last Year: Never true  Transportation Needs: No Transportation Needs (03/30/2023)   PRAPARE - Administrator, Civil Service (Medical): No    Lack of Transportation (Non-Medical): No  Physical Activity: Not on file  Stress: Not on file  Social Connections: Moderately Isolated (03/30/2023)   Social Connection and Isolation Panel [NHANES]    Frequency of Communication with Friends and Family: More than three times a week    Frequency of Social Gatherings with Friends and Family: More than three times a week    Attends Religious Services: More than 4 times per year    Active Member of [employer] or Organizations: No    Attends Banker Meetings: Never    Marital Status: Divorced    Allergies:  Allergies  Allergen Reactions   Latex Hives and Swelling   Ace Inhibitors     Other reaction(s): cough   Penicillin G     Other reaction(s): rash Has tolerated amoxicillin    Objective:    Vital Signs:   Temp:  [99.5 F (37.5 C)] 99.5 F (37.5 C) (03/20 0948) Pulse Rate:  [67-77] 70 (03/20 1215) Resp:  [14-21] 17 (03/20 1215) BP: (77-105)/(49-86) 95/65 (03/20 1215) SpO2:  [87 %-100 %] 99 % (03/20 1215) FiO2 (%):  [100 %] 100 % (03/20 0948) Weight:  [156.5 kg] 156.5 kg (03/20 0950)    Weight change: Filed Weights   03/30/23 0950  Weight: (!) 156.5 kg    Intake/Output:   Intake/Output Summary (Last 24 hours) at 03/30/2023 1415 Last data filed at 03/30/2023 1155 Gross per 24 hour  Intake 500 ml  Output --  Net 500 ml      Physical Exam    General:  In bed.No resp difficulty HEENT: normal Neck: supple. JVP difficult to assess due to body habitus. Carotids 2+ bilat; no bruits. No lymphadenopathy or thyromegaly appreciated. Cor: PMI nondisplaced. Regular rate & rhythm. No rubs, gallops or murmurs. Lungs: clear  Abdomen: soft, nontender, nondistended. No hepatosplenomegaly. No bruits or masses. Good bowel  sounds. Extremities: no cyanosis, clubbing, rash, R and LLE 1+ edema. RLE compression wrap.  Neuro: alert & orientedx3, cranial nerves grossly intact. moves all 4 extremities w/o difficulty. Affect pleasant   Telemetry   SR with occasional PVCs.   EKG    Junctional Rhythm with occasional PVCs.   Labs   Basic Metabolic Panel: Recent Labs  [name] 03/30/23 0947  NA 134*  K 3.9  CL 99  CO2 24  GLUCOSE 137*  BUN 18  CREATININE 1.18  CALCIUM 8.6*    Liver Function Tests: Recent Labs  [name] 03/30/23 0947  AST 23  ALT 14  ALKPHOS 104  BILITOT 1.3*  PROT 6.8  ALBUMIN 3.3*   No results for input(s): "LIPASE", "AMYLASE" in the last 168 hours. No results for input(s): "AMMONIA" in the last 168 hours.  CBC: Recent Labs  [name] 03/30/23 0947  WBC 10.5  NEUTROABS 9.1*  HGB 14.7  HCT 44.6  MCV 103.7*  PLT 142*    Cardiac Enzymes: No results for input(s): "CKTOTAL", "CKMB", "CKMBINDEX", "TROPONINI" in the last 168 hours.  BNP: BNP (last 3 results) Recent Labs    05/02/22 1503 01/30/23 1514 03/30/23 1006  BNP 586.5* 157.7* 670.9*    ProBNP (last 3 results) No results for input(s): "PROBNP" in the last 8760 hours.   CBG: No results for input(s): "GLUCAP" in the last 168 hours.  Coagulation Studies: No results for input(s): "LABPROT", "INR" in the last 72 hours.   Imaging   DG Chest 2 View Result Date: 03/30/2023 CLINICAL DATA:  cough, dyspnea. EXAM: CHEST - 2 VIEW COMPARISON:  09/02/2021. FINDINGS: Low lung volume. Mild diffuse interstitial prominence. It is uncertain whether this represents artifactual "crowding" of the interstitium related to suboptimal inspiration versus increased interstitial markings, which can be due to pulmonary edema, atypical pneumonia, etc. Bilateral lung fields are otherwise clear. Bilateral costophrenic angles are clear. Stable cardio-mediastinal silhouette. There is a left sided 2-lead pacemaker. No acute osseous abnormalities. The  soft tissues are within normal limits. IMPRESSION: Mild diffuse interstitial prominence, as discussed above. Otherwise no acute cardiopulmonary abnormality. Electronically Signed   By: Jules Schick M.D.   On: 03/30/2023 10:39     Medications:     Current Medications:  apixaban  5 mg Oral BID   ascorbic acid  500 mg Oral BID   atorvastatin  10 mg Oral Daily   cholecalciferol  1,000 Units Oral Daily   cyanocobalamin  1,000 mcg Oral Daily   dapagliflozin propanediol  10 mg Oral Daily   Omega 3  340 mg Oral BID   pantoprazole  40 mg Oral Daily   sertraline  50 mg Oral Daily   sodium chloride flush  3 mL Intravenous Q12H    Infusions:  sodium chloride     sodium chloride        Patient Profile   [name] Girvan is a 68 year old with a history of chronic biventricular HFrEF, chronic  aifb, OSA, HTN, HLD, HTN, DMII, OSA, ILD, tobacco abuse, ILD, CAD and chemo RCHOP for lymphoma in 2011.   Presented with increased shortness of breath, cough, nausea, and vomiting. .   Assessment/Plan   1. A/C Biventricular HFrEF Mixed NICM/ICM diffuse CAD. PYP equivocal.  - Echo 12/23 30% Has Medtronic CRT-D. Impedance low. Fluid index well above threshold since 03/06/23. Interrogation suggestive of fluid accumulation. BNP 670 . On exam he appears volume overloaded.  SBP soft. Lactic acid 1.4  -Start lasix 80 mg twice a day.  -For now hold entresto/spiro/coreg with soft SBP.   -Continue Farxiga  -Follow daily BMET  -Repeat ECHO   2. Nausea/Vomiting  -Suspect viral ? WBC is not elevated.  -Blood Cx pending.   3. Hypotension -As above holding entresto/spiro.   4. OSA Continue nightly CPAP  5. Chronic A fib Rate controlled. Continue eliquis.   Length of Stay: 0  Tonye Becket, NP  03/30/2023, 2:15 PM  Advanced Heart Failure Team Pager [phone] (M-F; 7a - 5p)  Please contact [facility] for night-coverage after hours (4p -7a ) and weekends on [email or web address]   Patient seen and examined with  the above-signed Advanced Practice Provider and/or Housestaff. I personally reviewed laboratory data, imaging studies and relevant notes. I independently examined the patient and formulated the important aspects of the plan. I have edited the note to reflect any of my changes or salient points. I have personally discussed the plan with the patient and/or family.  68 y/o male with chronic systolic HF due to mixed CM. EF 30%, morbid obesity, chronic AF, OSA PAD with non-healing wounds.   Presents with progressive HF symptoms and volume overload.   ICD interrogated personally. No VT or shock. Volume has been up Personally reviewed  General:  Obese male lying in bed No resp difficulty HEENT: normal Neck: supple. JVP 10 Carotids 2+ bilat; no bruits. No lymphadenopathy or thryomegaly appreciated. FAO:ZHYQM  Lungs: clear Abdomen: obese  soft, nontender, nondistended. No hepatosplenomegaly. No bruits or masses. Good bowel sounds. Extremities: no cyanosis, clubbing, rash,2+ edema with chronic venous stasis changes RLE wrapped Neuro: alert & orientedx3, cranial nerves grossly intact. moves all 4 extremities w/o difficulty. Affect pleasant  Agree with admission for IV diuresis. Lasix 80 iv bid. Use ted hose Watch renal function, with IV diuresis  Continue Eliquis for AF.   Arvilla Meres, [name]  4:01 PM

## 2023-03-30 NOTE — Progress Notes (Signed)
 Patient has existing venous status wound on right ankle. Per patient the size is 3 cm by 2 cm. Patient is refusing nursing to remove existing dressing done by wound clinic. Nursing added WOC consult and added wound to LDA without measurements.

## 2023-03-30 NOTE — Progress Notes (Signed)
 Heart Failure Navigator Progress Note  Assessed for Heart & Vascular TOC clinic readiness.  Patient does not meet criteria due to Advanced Heart Failure Team patient of Dr. Gala Romney. .   Navigator will sign off at this time.   Rhae Hammock, BSN, Scientist, clinical (histocompatibility and immunogenetics) Only

## 2023-03-30 NOTE — ED Notes (Signed)
 To xray via stretcher, 4L [place], tolerating, calm, NAD.

## 2023-03-30 NOTE — H&P (Signed)
 History and Physical    DOA: 03/30/2023  PCP: [name], MD  Patient coming from: HOME  Chief Complaint:shortness of breath, n/v  HPI: [name] is a 67 y.o. male with history h/o HTN, DM, CHF, low EF-30% per echo in December 2023, sleep apnea on CPAP, atrial fibrillation on anticoagulation, status post pacemaker, pulmonary hypertension, gout, chronic venous stasis with ankle ulcer-follows wound care as outpatient presented with complaints of shortness of breath that has been going on for 3 days.  Patient states at baseline he can walk about 50 feet prior to getting short of breath.  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  3 days back he had some exertional dyspnea-short of breath on walking about 25 feet which progressively worsened over the last couple of days.  He denies any orthopnea or PND and reports using 2 pillows while sleeping with CPAP on.  Last night he had a hamburger for dinner.  Around 2 AM he woke up dry heaving and then had episodes of vomiting-describes the vomitus as "copious green phlegm" and worsening shortness of breath with some cough subsequently.  Does have chronic leg swellings and denies any recent worsening.  He was seen by wound care clinic yesterday for Unna boot/dressing changes which he tolerated well.  He denies any chest pain episodes.  He denies any fever, chills or sick contacts. ED workup: Afebrile, pulse 66-77, respiratory rate 14-17, BP initially low at 77/59-received IV fluid bolus x 2 with improvement to systolic 90s-100s.  O2 sat 87%-presented with CPAP and route and then transition to 4 L nasal cannula, currently saturating 99% on 2 L nasal cannula.  Labs showed WBC 10.5, hemoglobin 14.7, hematocrit 44.6, platelet 142, sodium 134, potassium 3.9, chloride 99, bicarb 24, BUN 18, creatinine 1.18, calcium 8.6, BG 137, HS troponin 55-72, BNP 670.9, lactate 1.4.  EKG-junctional rhythm with heart rate  72, QTc 473 ms, PVCs.  Chest x-ray mild diffuse interstitial prominence due to pulmonary edema versus atypical pneumonia.   Review of Systems: As per HPI, otherwise review of systems negative.    Past Medical History:  Diagnosis Date   Acute systolic HF (heart failure) (HCC)    Arthritis    lt foot, hips knees    Atrial fibrillation (HCC)    Diabetes mellitus, new onset (HCC)    Diverticulosis    Dyslipidemia    Gout attack 01/16/2012   Hypertension    Internal hemorrhoids    Lymphoma (HCC)    remission for about 2 years, chemo 3 years prior   Pneumonia due to COVID-19 virus 07/2018   Pulmonary hypertension (HCC)    Sepsis (HCC) 02/2017   secondary to influenza; requiring trach   Tubular adenoma of colon    Venous stasis ulcers (HCC)     Past Surgical History:  Procedure Laterality Date   ABDOMINAL AORTOGRAM W/LOWER EXTREMITY N/A 08/14/2020   Procedure: ABDOMINAL AORTOGRAM W/LOWER EXTREMITY;  Surgeon: Sherren Kerns, MD;  Location: [facility];  Service: Cardiovascular;  Laterality: N/A;   AMPUTATION TOE Left 05/08/2019   Procedure: AMPUTATION LEFT GREAT  TOE;  Surgeon: Vivi Barrack, DPM;  Location: [facility];  Service: Podiatry;  Laterality: Left;   ARTERIAL LINE INSERTION Right 08/26/2021   Procedure: ARTERIAL LINE INSERTION;  Surgeon: Orbie Pyo, MD;  Location: [facility];  Service: Cardiovascular;  Laterality: Right;  rt radial   BIV ICD  INSERTION CRT-D N/A 09/01/2021   Procedure: BIV ICD INSERTION CRT-D;  Surgeon: Marinus Maw, MD;  Location: [facility]IVE CV [facility];  Service: Cardiovascular;  Laterality: N/A;   BRONCHOSCOPY     ESOPHAGOGASTRODUODENOSCOPY ENDOSCOPY     Dr Ernestine Mcmurray HERNIA REPAIR Left    2001   IR REMOVAL TUN ACCESS W/ PORT W/O FL MOD SED  02/03/2020   PORTA CATH INSERTION  2011   RIGHT/LEFT HEART CATH AND CORONARY ANGIOGRAPHY N/A 08/27/2021   Procedure: RIGHT/LEFT HEART CATH AND CORONARY ANGIOGRAPHY;  Surgeon: Dolores Patty, MD;  Location: [facility];  Service: Cardiovascular;  Laterality: N/A;   TEMPORARY PACEMAKER N/A 08/26/2021   Procedure: TEMPORARY PACEMAKER;  Surgeon: Orbie Pyo, MD;  Location: [facility];  Service: Cardiovascular;  Laterality: N/A;   TRACHEOSTOMY  03/2017   with decannulation   TRANSMETATARSAL AMPUTATION Left 08/05/2020   Procedure: TRANSMETATARSAL AMPUTATION;  Surgeon: Vivi Barrack, DPM;  Location: [facility] OR;  Service: Podiatry;  Laterality: Left;    Social history:  reports that he quit smoking about 14 years ago. His smoking use included cigarettes. He started smoking about 34 years ago. He has a 10 pack-year smoking history. He has never used smokeless tobacco. He reports that he does not currently use alcohol. He reports that he does not use drugs.   Allergies  Allergen Reactions   Latex Hives and Swelling   Ace Inhibitors     Other reaction(s): cough   Penicillin G     Other reaction(s): rash Has tolerated amoxicillin    Family History  Problem Relation Age of Onset   Dementia Brother 88       frontal lobe    Dementia Mother    Arthritis Mother    Hypertension Mother    CVA Sister 26   Colon cancer Neg Hx    Pancreatic cancer Neg Hx    Stomach cancer Neg Hx    Rectal cancer Neg Hx    Liver cancer Neg Hx       Prior to Admission medications   Medication Sig Start Date End Date Taking? Authorizing Provider  acetaminophen (TYLENOL) 325 MG tablet Take 650 mg by mouth every 6 (six) hours as needed for moderate pain.     [provider]  apixaban (ELIQUIS) 5 MG TABS tablet Take 1 tablet (5 mg total) by mouth 2 (two) times daily. NEEDS FOLLOW UP APPOINTMENT 11/15/22   Bensimhon, Bevelyn Buckles, MD  ascorbic acid (VITAMIN C) 500 MG tablet Take 1 tablet (500 mg total) by mouth 3 (three) times daily. Patient taking differently: Take 500 mg by mouth 2 (two) times daily. 08/26/20   Angiulli, Mcarthur Rossetti, PA-C  atorvastatin (LIPITOR) 40 MG  tablet TAKE 1 TABLET BY MOUTH EVERY DAY 10/04/21   Bensimhon, Bevelyn Buckles, MD  carvedilol (COREG) 3.125 MG tablet Take 1 tablet (3.125 mg total) by mouth 2 (two) times daily. NEEDS FOLLOW UP APPOINTMENT FOR MORE REFILLS 10/07/22   Bensimhon, Bevelyn Buckles, MD  cholecalciferol (VITAMIN D) 25 MCG (1000 UNIT) tablet Take 1 tablet (1,000 Units total) by mouth daily. 08/26/20   Angiulli, Mcarthur Rossetti, PA-C  colchicine 0.6 MG tablet Take 0.6 mg by mouth 2 (two) times daily as needed.    [provider]  [facility] ASPIRIN ADULT LOW DOSE 81 MG chewable tablet CHEW 1 TABLET BY MOUTH DAILY. 08/29/22   Bensimhon, Bevelyn Buckles, MD  dapagliflozin propanediol (FARXIGA) 10 MG TABS tablet Take  1 tablet (10 mg total) by mouth daily. 03/25/22   Bensimhon, Bevelyn Buckles, MD  Omega 3 340 MG CPDR Take 1 capsule (340 mg total) by mouth 2 (two) times daily. Patient taking differently: Take 340 mg by mouth daily. 08/26/20   Angiulli, Mcarthur Rossetti, PA-C  omeprazole (PRILOSEC) 40 MG capsule Take 1 capsule (40 mg total) by mouth daily. 08/26/20   Angiulli, Mcarthur Rossetti, PA-C  sacubitril-valsartan (ENTRESTO) 24-26 MG Take 1 tablet by mouth 2 (two) times daily. 01/30/23   Alen Bleacher, NP  sertraline (ZOLOFT) 50 MG tablet Take 1 tablet (50 mg total) by mouth daily. 08/26/20   Angiulli, Mcarthur Rossetti, PA-C  spironolactone (ALDACTONE) 25 MG tablet Take 0.5 tablets (12.5 mg total) by mouth daily. 02/17/23   Alen Bleacher, NP  torsemide (DEMADEX) 20 MG tablet Patient is taking 2 tablets by mouth daily. 01/30/23   Alen Bleacher, NP  vitamin B-12 (CYANOCOBALAMIN) 1000 MCG tablet Take 1 tablet (1,000 mcg total) by mouth daily. 08/26/20   Angiulli, Mcarthur Rossetti, PA-C  sodium chloride (OCEAN) 0.65 % SOLN nasal spray Place 2 sprays into both nostrils as needed for congestion. 04/05/17 10/05/20  Jacquelynn Cree, PA-C    Physical Exam: Vitals:   03/30/23 1130 03/30/23 1145 03/30/23 1200 03/30/23 1215  BP: (!) 78/49 90/61 98/60  95/65  Pulse: 70 71 70 70  Resp: 18 (!) 21 14 17   Temp:       TempSrc:      SpO2: 97% 97% 99% 99%  Weight:        Constitutional: NAD, calm, comfortable Eyes: PERRL, lids and conjunctivae normal ENMT: Mucous membranes are moist. Posterior pharynx clear of any exudate or lesions.Normal dentition.  Neck: normal, supple, no masses, no thyromegaly Respiratory: clear to auscultation bilaterally, no wheezing, no crackles. Normal respiratory effort. No accessory muscle use.  Cardiovascular: Regular rate and rhythm, status post pacemaker, no murmurs / rubs / gallops.  3+ bilateral lower extremity edema-somewhat pitting on the left side appreciated, Unna boots in place for rt side leg wounds. 2+ pedal pulses. No carotid bruits.  Abdomen: Obese, no tenderness, no masses palpated. No hepatosplenomegaly. Bowel sounds positive.  Musculoskeletal: no clubbing / cyanosis. No joint deformity upper and lower extremities. Good ROM, no contractures. Normal muscle tone.  Neurologic: CN 2-12 grossly intact. Sensation intact, DTR normal. Strength 5/5 in all 4.  Psychiatric: Normal judgment and insight. Alert and oriented x 3. Normal mood.  SKIN/catheters: Chronic right foot/ankle venous ulcers, status post Ace wrap dressing/Unna boots on the right side Labs on Admission: I have personally reviewed following labs and imaging studies  CBC: Recent Labs  Lab 03/30/23 0947  WBC 10.5  NEUTROABS 9.1*  HGB 14.7  HCT 44.6  MCV 103.7*  PLT 142*   Basic Metabolic Panel: Recent Labs  Lab 03/30/23 0947  NA 134*  K 3.9  CL 99  CO2 24  GLUCOSE 137*  BUN 18  CREATININE 1.18  CALCIUM 8.6*   GFR: Estimated Creatinine Clearance: 92.6 mL/min (by C-G formula based on SCr of 1.18 mg/dL). Recent Labs  Lab 03/30/23 0947 03/30/23 1230  WBC 10.5  --   LATICACIDVEN  --  1.4   Liver Function Tests: Recent Labs  Lab 03/30/23 0947  AST 23  ALT 14  ALKPHOS 104  BILITOT 1.3*  PROT 6.8  ALBUMIN 3.3*   No results for input(s): "LIPASE", "AMYLASE" in the last 168  hours. No results for input(s): "AMMONIA" in the last 168  hours. Coagulation Profile: No results for input(s): "INR", "PROTIME" in the last 168 hours. Cardiac Enzymes: No results for input(s): "CKTOTAL", "CKMB", "CKMBINDEX", "TROPONINI" in the last 168 hours. BNP (last 3 results) No results for input(s): "PROBNP" in the last 8760 hours. HbA1C: No results for input(s): "HGBA1C" in the last 72 hours. CBG: No results for input(s): "GLUCAP" in the last 168 hours. Lipid Profile: No results for input(s): "CHOL", "HDL", "LDLCALC", "TRIG", "CHOLHDL", "LDLDIRECT" in the last 72 hours. Thyroid Function Tests: No results for input(s): "TSH", "T4TOTAL", "FREET4", "T3FREE", "THYROIDAB" in the last 72 hours. Anemia Panel: No results for input(s): "VITAMINB12", "FOLATE", "FERRITIN", "TIBC", "IRON", "RETICCTPCT" in the last 72 hours. Urine analysis:    Component Value Date/Time   COLORURINE YELLOW 03/22/2017 1700   APPEARANCEUR CLEAR 03/22/2017 1700   LABSPEC 1.008 03/22/2017 1700   PHURINE 8.0 03/22/2017 1700   GLUCOSEU NEGATIVE 03/22/2017 1700   HGBUR NEGATIVE 03/22/2017 1700   BILIRUBINUR NEGATIVE 03/22/2017 1700   KETONESUR NEGATIVE 03/22/2017 1700   PROTEINUR NEGATIVE 03/22/2017 1700   NITRITE NEGATIVE 03/22/2017 1700   LEUKOCYTESUR SMALL (A) 03/22/2017 1700    Radiological Exams on Admission: Personally reviewed  DG Chest 2 View Result Date: 03/30/2023 CLINICAL DATA:  cough, dyspnea. EXAM: CHEST - 2 VIEW COMPARISON:  09/02/2021. FINDINGS: Low lung volume. Mild diffuse interstitial prominence. It is uncertain whether this represents artifactual "crowding" of the interstitium related to suboptimal inspiration versus increased interstitial markings, which can be due to pulmonary edema, atypical pneumonia, etc. Bilateral lung fields are otherwise clear. Bilateral costophrenic angles are clear. Stable cardio-mediastinal silhouette. There is a left sided 2-lead pacemaker. No acute osseous  abnormalities. The soft tissues are within normal limits. IMPRESSION: Mild diffuse interstitial prominence, as discussed above. Otherwise no acute cardiopulmonary abnormality. Electronically Signed   By: Jules Schick M.D.   On: 03/30/2023 10:39    EKG: Independently reviewed. junctional rhythm with heart rate 72, QTc 473 ms, PVCs.      Assessment and Plan:   Principal Problem:   CHF exacerbation (HCC)    1.Shortness of breath with acute hypoxic respiratory failure: Secondary to mild  acute on chronic systolic CHF, probably exacerbated by aspiration during the episode of nausea vomiting overnight.  Patient hypotensive on presentation and received IV fluid boluses in the ED.  Will hold off on IV fluids/diuretics at this time as blood pressure soft and breathing stable on 2 L nasal cannula.  Patient does have elevated troponin--denied any complaints of chest pain to me.  EKG shows no ST-T changes.  Requested cardiology evaluation for possible ACS precipitating CHF versus demand ischemia in the setting of hypotension.  Also patient needs complex CHF management with need for diuresis (due to shortness of breath/elevated BNP/pulmonary edema/leg swellings-albeit chronic) in the setting of hypotension.  Repeat echo to rule out worsening of EF or new wall motion abnormalities.  Will trend troponin.  No fever or history of sick contacts or white count elevation.  Viral panel negative. Will  treat with empiric antibiotics for possible aspiration related worsening dyspnea/chest x-ray findings.  Continue 02- 2 L [place], albuterol nebulizer as needed.  2.  Nausea vomiting: Secondary to food intolerance (purchased hamburger for dinner last night) versus cardiac related.  Symptomatic management for now.  PPI.  3.  Hypotension with history of hypertension: Likely related to dehydration from nausea/vomiting in the setting of low EF.  Hold home medications for now as blood pressure soft and might need initiation of IV  diuretics while  cautiously watching blood pressure when tolerates.  4.  Chronic atrial fibrillation, complete heart block status post pacemaker: Resume beta-blockers when blood pressure tolerates.  Continue anticoagulation.  5.  Diabetes mellitus: Resume home medications, sliding scale insulin.  6.  Chronic venous stasis, foot/ankle ulcers: Patient follows wound care clinic for right foot/fifth toe ulcer as well as an ulcer on the right ankle/medial malleolus.  Currently has dressing in place-last changed yesterday.  Follow-up in wound care clinic as scheduled next week.  7. Sleep apnea: Resume CPAP at 5 mmHg.  DVT prophylaxis: On anticoagulation  COVID screen: Negative  Code Status: Full code as discussed with patient. Patient/Family Communication: Discussed with patient and friend-Cindy at bedside, all questions answered to satisfaction.  Consults called: Cardiology Admission status :I certify that at the point of admission it is my clinical judgment that the patient will require inpatient hospital care spanning beyond 2 midnights from the point of admission due to high intensity of service and high frequency of surveillance required.Inpatient status is judged to be reasonable and necessary in order to provide the required intensity of service to ensure the patient's safety. The patient's presenting symptoms, physical exam findings, and initial radiographic and laboratory data in the context of their chronic comorbidities is felt to place them at high risk for further clinical deterioration. The following factors support the patient status of inpatient : Acute hypoxic respiratory failure and complex CHF management with volume overload/hypotension.     Alessandra Bevels MD [facility] Pager in [place]  If 7PM-7AM, please contact night-coverage [email or web address]   03/30/2023, 1:58 PM

## 2023-03-30 NOTE — ED Triage Notes (Signed)
 BIB [facility] from home for sob, DOE, cough, flu-like sx, including NVD. Felt bad for 2d. Known sick contact last week. Sudden onset of sob at 0200. h/o HF. BLE and feet swollen/ taut/ firm. L post op shoe present. Crackles throughout. ETCO2 35, RR 40, HR NSR 76, BP 120/76, afebrile. Not on home O2. Arrives on CPAP. Improved on arrival. Denies pain. Increased wob present. Skin drier, and pink. Alert, NAD, calm, interactive, answering questions in shortened phrases. EDP and RT present. NSL 18g, L AC present.

## 2023-03-30 NOTE — ED Notes (Signed)
 Back from xray, alert, NAD, calm, interactive, resting comfortably on 4L [place].

## 2023-03-30 NOTE — ED Provider Notes (Signed)
 [place] EMERGENCY DEPARTMENT AT [facility] Provider Note   CSN: [id number] Arrival date & time: 03/30/23  2841     History  Chief Complaint  Patient presents with   Shortness of Breath    [name] is a 68 y.o. male.  HPI 68 year old male presents with shortness of breath.  History is from patient and EMS.  He woke up at 2 AM feeling sick and was short of breath.  He developed nausea, vomiting, diarrhea.  No fevers but has had a productive cough.  Had some transient chest pain but that is now gone.  Did have some severe shortness of breath on EMS arrival and was put on CPAP with improvement in symptoms.  He feels like he is still a little labored but much better.  EMS noted him to have sats in the 80s.  He has some chronic but unchanged leg swelling.  Patient has a history pertinent for A-fib, diabetes, hypertension, CHF, pulmonary hypertension.  Home Medications Prior to Admission medications   Medication Sig Start Date End Date Taking? Authorizing Provider  acetaminophen (TYLENOL) 325 MG tablet Take 650 mg by mouth every 6 (six) hours as needed for moderate pain.     [provider]  apixaban (ELIQUIS) 5 MG TABS tablet Take 1 tablet (5 mg total) by mouth 2 (two) times daily. NEEDS FOLLOW UP APPOINTMENT 11/15/22   Bensimhon, Bevelyn Buckles, MD  ascorbic acid (VITAMIN C) 500 MG tablet Take 1 tablet (500 mg total) by mouth 3 (three) times daily. Patient taking differently: Take 500 mg by mouth 2 (two) times daily. 08/26/20   Angiulli, Mcarthur Rossetti, PA-C  atorvastatin (LIPITOR) 40 MG tablet TAKE 1 TABLET BY MOUTH EVERY DAY 10/04/21   Bensimhon, Bevelyn Buckles, MD  carvedilol (COREG) 3.125 MG tablet Take 1 tablet (3.125 mg total) by mouth 2 (two) times daily. NEEDS FOLLOW UP APPOINTMENT FOR MORE REFILLS 10/07/22   Bensimhon, Bevelyn Buckles, MD  cholecalciferol (VITAMIN D) 25 MCG (1000 UNIT) tablet Take 1 tablet (1,000 Units total) by mouth daily. 08/26/20   Angiulli, Mcarthur Rossetti, PA-C   colchicine 0.6 MG tablet Take 0.6 mg by mouth 2 (two) times daily as needed.    [provider]  [facility] ASPIRIN ADULT LOW DOSE 81 MG chewable tablet CHEW 1 TABLET BY MOUTH DAILY. 08/29/22   Bensimhon, Bevelyn Buckles, MD  dapagliflozin propanediol (FARXIGA) 10 MG TABS tablet Take 1 tablet (10 mg total) by mouth daily. 03/25/22   Bensimhon, Bevelyn Buckles, MD  Omega 3 340 MG CPDR Take 1 capsule (340 mg total) by mouth 2 (two) times daily. Patient taking differently: Take 340 mg by mouth daily. 08/26/20   Angiulli, Mcarthur Rossetti, PA-C  omeprazole (PRILOSEC) 40 MG capsule Take 1 capsule (40 mg total) by mouth daily. 08/26/20   Angiulli, Mcarthur Rossetti, PA-C  sacubitril-valsartan (ENTRESTO) 24-26 MG Take 1 tablet by mouth 2 (two) times daily. 01/30/23   Alen Bleacher, NP  sertraline (ZOLOFT) 50 MG tablet Take 1 tablet (50 mg total) by mouth daily. 08/26/20   Angiulli, Mcarthur Rossetti, PA-C  spironolactone (ALDACTONE) 25 MG tablet Take 0.5 tablets (12.5 mg total) by mouth daily. 02/17/23   Alen Bleacher, NP  torsemide (DEMADEX) 20 MG tablet Patient is taking 2 tablets by mouth daily. 01/30/23   Alen Bleacher, NP  vitamin B-12 (CYANOCOBALAMIN) 1000 MCG tablet Take 1 tablet (1,000 mcg total) by mouth daily. 08/26/20   Angiulli, Mcarthur Rossetti, PA-C  sodium chloride Leotis Shames)  0.65 % SOLN nasal spray Place 2 sprays into both nostrils as needed for congestion. 04/05/17 10/05/20  Jacquelynn Cree, PA-C      Allergies    Latex, Ace inhibitors, and Penicillin g    Review of Systems   Review of Systems  Constitutional:  Negative for fever.  Respiratory:  Positive for cough and shortness of breath.   Cardiovascular:  Positive for chest pain and leg swelling.  Gastrointestinal:  Positive for diarrhea, nausea and vomiting.    Physical Exam Updated Vital Signs BP 95/65   Pulse 70   Temp 99.5 F (37.5 C) (Temporal)   Resp 17   Wt (!) 156.5 kg   SpO2 99%   BMI 48.12 kg/m  Physical Exam Vitals and nursing note reviewed.  Constitutional:       General: He is not in acute distress.    Appearance: He is well-developed. He is obese. He is not ill-appearing or diaphoretic.  HENT:     Head: Normocephalic and atraumatic.  Cardiovascular:     Rate and Rhythm: Normal rate and regular rhythm.     Heart sounds: Normal heart sounds.  Pulmonary:     Effort: Pulmonary effort is normal. No accessory muscle usage.     Breath sounds: Rales (mild, bases) present.  Abdominal:     Palpations: Abdomen is soft.     Tenderness: There is no abdominal tenderness.  Musculoskeletal:     Right lower leg: No edema.     Left lower leg: No edema.  Skin:    General: Skin is warm and dry.  Neurological:     Mental Status: He is alert.     ED Results / Procedures / Treatments   Labs (all labs ordered are listed, but only abnormal results are displayed) Labs Reviewed  COMPREHENSIVE METABOLIC PANEL - Abnormal; Notable for the following components:      Result Value   Sodium 134 (*)    Glucose, Bld 137 (*)    Calcium 8.6 (*)    Albumin 3.3 (*)    Total Bilirubin 1.3 (*)    All other components within normal limits  BRAIN NATRIURETIC PEPTIDE - Abnormal; Notable for the following components:   B Natriuretic Peptide 670.9 (*)    All other components within normal limits  CBC WITH DIFFERENTIAL/PLATELET - Abnormal; Notable for the following components:   MCV 103.7 (*)    MCH 34.2 (*)    RDW 16.7 (*)    Platelets 142 (*)    Neutro Abs 9.1 (*)    Lymphs Abs 0.4 (*)    Abs Immature Granulocytes 0.08 (*)    All other components within normal limits  TROPONIN I (HIGH SENSITIVITY) - Abnormal; Notable for the following components:   Troponin I (High Sensitivity) 55 (*)    All other components within normal limits  TROPONIN I (HIGH SENSITIVITY) - Abnormal; Notable for the following components:   Troponin I (High Sensitivity) 72 (*)    All other components within normal limits  RESP PANEL BY RT-PCR (RSV, FLU A&B, COVID)  RVPGX2  CULTURE, BLOOD  (ROUTINE X 2)  CULTURE, BLOOD (ROUTINE X 2)  HIV ANTIBODY (ROUTINE TESTING W REFLEX)  HEMOGLOBIN A1C  I-STAT CG4 LACTIC ACID, ED  I-STAT CG4 LACTIC ACID, ED  TROPONIN I (HIGH SENSITIVITY)    EKG EKG Interpretation Date/Time:  Thursday March 30 2023 09:48:03 EDT Ventricular Rate:  72 PR Interval:    QRS Duration:  150 QT Interval:  432  QTC Calculation: 473 R Axis:   -35  Text Interpretation: Accelerated junctional rhythm Ventricular premature complex Nonspecific intraventricular conduction delay Probable inferior infarct, age indeterminate Confirmed by Pricilla Loveless [phone]) on 03/30/2023 10:18:44 AM  Radiology DG Chest 2 View Result Date: 03/30/2023 CLINICAL DATA:  cough, dyspnea. EXAM: CHEST - 2 VIEW COMPARISON:  09/02/2021. FINDINGS: Low lung volume. Mild diffuse interstitial prominence. It is uncertain whether this represents artifactual "crowding" of the interstitium related to suboptimal inspiration versus increased interstitial markings, which can be due to pulmonary edema, atypical pneumonia, etc. Bilateral lung fields are otherwise clear. Bilateral costophrenic angles are clear. Stable cardio-mediastinal silhouette. There is a left sided 2-lead pacemaker. No acute osseous abnormalities. The soft tissues are within normal limits. IMPRESSION: Mild diffuse interstitial prominence, as discussed above. Otherwise no acute cardiopulmonary abnormality. Electronically Signed   By: Jules Schick M.D.   On: 03/30/2023 10:39    Procedures Procedures    Medications Ordered in ED Medications  sodium chloride 0.9 % bolus 500 mL (has no administration in time range)  colchicine tablet 0.6 mg (has no administration in time range)  atorvastatin (LIPITOR) tablet 10 mg (has no administration in time range)  sertraline (ZOLOFT) tablet 50 mg (has no administration in time range)  dapagliflozin propanediol (FARXIGA) tablet 10 mg (has no administration in time range)  pantoprazole (PROTONIX) EC  tablet 40 mg (has no administration in time range)  apixaban (ELIQUIS) tablet 5 mg (has no administration in time range)  cyanocobalamin (VITAMIN B12) tablet 1,000 mcg (has no administration in time range)  ascorbic acid (VITAMIN C) tablet 500 mg (has no administration in time range)  cholecalciferol (VITAMIN D3) 25 MCG (1000 UNIT) tablet 1,000 Units (has no administration in time range)  Omega 3 CPDR 340 mg (has no administration in time range)  sodium chloride flush (NS) 0.9 % injection 3 mL (has no administration in time range)  sodium chloride flush (NS) 0.9 % injection 3 mL (has no administration in time range)  0.9 %  sodium chloride infusion (has no administration in time range)  acetaminophen (TYLENOL) tablet 650 mg (has no administration in time range)  ondansetron (ZOFRAN) injection 4 mg (has no administration in time range)  insulin aspart (novoLOG) injection 0-15 Units (has no administration in time range)  cefTRIAXone (ROCEPHIN) 2 g in sodium chloride 0.9 % 100 mL IVPB (has no administration in time range)  albuterol (PROVENTIL) (2.5 MG/3ML) 0.083% nebulizer solution 2.5 mg (has no administration in time range)  furosemide (LASIX) injection 80 mg (has no administration in time range)  sodium chloride 0.9 % bolus 500 mL (0 mLs Intravenous Stopped 03/30/23 1155)  cefTRIAXone (ROCEPHIN) 2 g in sodium chloride 0.9 % 100 mL IVPB (2 g Intravenous New Bag/Given 03/30/23 1224)  azithromycin (ZITHROMAX) 500 mg in sodium chloride 0.9 % 250 mL IVPB (500 mg Intravenous New Bag/Given 03/30/23 1222)    ED Course/ Medical Decision Making/ A&P                                 Medical Decision Making Amount and/or Complexity of Data Reviewed Labs: ordered.    Details: BNP 670.  Lactate normal Radiology: ordered and independent interpretation performed.    Details: No lobar pneumonia ECG/medicine tests: ordered and independent interpretation performed.    Details: No acute  ischemia  Risk Decision regarding hospitalization.   Patient presents with shortness of breath.  Is hypoxic requiring oxygen.  Could be CHF but also could be atypical pneumonia and with his sudden onset of symptoms without obvious peripheral edema, we will treat him as if this is pneumonia with antibiotics.  He was given some small boluses of fluids as he has CHF but his pressures have been soft.  This is despite not taking his blood pressure medicine this morning.  Could be dehydration related, especially with vomiting and diarrhea.  COVID and flu testing is negative.  Will admit to the hospital service, discussed with Dr. Lajuana Ripple.        Final Clinical Impression(s) / ED Diagnoses Final diagnoses:  Acute respiratory failure with hypoxia [facility])    Rx / DC Orders ED Discharge Orders     None         Pricilla Loveless, MD 03/30/23 1506

## 2023-03-31 ENCOUNTER — Inpatient Hospital Stay (HOSPITAL_COMMUNITY)

## 2023-03-31 DIAGNOSIS — I2510 Atherosclerotic heart disease of native coronary artery without angina pectoris: Secondary | ICD-10-CM

## 2023-03-31 DIAGNOSIS — I2583 Coronary atherosclerosis due to lipid rich plaque: Secondary | ICD-10-CM

## 2023-03-31 DIAGNOSIS — I5023 Acute on chronic systolic (congestive) heart failure: Secondary | ICD-10-CM

## 2023-03-31 DIAGNOSIS — I5043 Acute on chronic combined systolic (congestive) and diastolic (congestive) heart failure: Secondary | ICD-10-CM

## 2023-03-31 DIAGNOSIS — E1169 Type 2 diabetes mellitus with other specified complication: Secondary | ICD-10-CM

## 2023-03-31 DIAGNOSIS — N179 Acute kidney failure, unspecified: Secondary | ICD-10-CM | POA: Diagnosis not present

## 2023-03-31 DIAGNOSIS — E785 Hyperlipidemia, unspecified: Secondary | ICD-10-CM

## 2023-03-31 DIAGNOSIS — E66813 Obesity, class 3: Secondary | ICD-10-CM

## 2023-03-31 DIAGNOSIS — I482 Chronic atrial fibrillation, unspecified: Secondary | ICD-10-CM | POA: Diagnosis not present

## 2023-03-31 DIAGNOSIS — I5021 Acute systolic (congestive) heart failure: Secondary | ICD-10-CM | POA: Diagnosis not present

## 2023-03-31 DIAGNOSIS — C8590 Non-Hodgkin lymphoma, unspecified, unspecified site: Secondary | ICD-10-CM

## 2023-03-31 LAB — GLUCOSE, CAPILLARY
Glucose-Capillary: 90 mg/dL (ref 70–99)
Glucose-Capillary: 113 mg/dL — ABNORMAL HIGH (ref 70–99)
Glucose-Capillary: 122 mg/dL — ABNORMAL HIGH (ref 70–99)

## 2023-03-31 LAB — ECHOCARDIOGRAM COMPLETE
Height: 71 in
S' Lateral: 4.8 cm
Weight: 5545.6 [oz_av]

## 2023-03-31 LAB — BASIC METABOLIC PANEL
Anion gap: 15 (ref 5–15)
BUN: 20 mg/dL (ref 8–23)
CO2: 24 mmol/L (ref 22–32)
Calcium: 8.7 mg/dL — ABNORMAL LOW (ref 8.9–10.3)
Chloride: 99 mmol/L (ref 98–111)
Creatinine, Ser: 1.21 mg/dL (ref 0.61–1.24)
GFR, Estimated: >60 mL/min (ref >60)
Glucose, Bld: 104 mg/dL — ABNORMAL HIGH (ref 70–99)
Potassium: 3.8 mmol/L (ref 3.5–5.1)
Sodium: 138 mmol/L (ref 135–145)

## 2023-03-31 MED ORDER — COLLAGENASE 250 UNIT/GM EX OINT
TOPICAL_OINTMENT | Freq: Every day | CUTANEOUS | Status: DC
  Filled 2023-03-31: qty 30

## 2023-03-31 MED ORDER — SIMETHICONE 80 MG PO CHEW: 80.0000 mg | CHEWABLE_TABLET | Freq: Four times a day (QID) | ORAL | Status: DC | PRN

## 2023-03-31 MED ORDER — SACUBITRIL-VALSARTAN 24-26 MG PO TABS
1.0000 | ORAL_TABLET | Freq: Two times a day (BID) | ORAL | Status: DC
  Administered 2023-03-31 – 2023-04-03 (×6): 1 via ORAL
  Filled 2023-03-31 (×7): qty 1

## 2023-03-31 MED ORDER — SPIRONOLACTONE 12.5 MG HALF TABLET
12.5000 mg | ORAL_TABLET | Freq: Every day | ORAL | Status: DC
  Administered 2023-03-31 – 2023-04-03 (×3): 12.5 mg via ORAL
  Filled 2023-03-31 (×4): qty 1

## 2023-03-31 MED ORDER — FUROSEMIDE 10 MG/ML IJ SOLN
15.0000 mg/h | INTRAVENOUS | Status: DC
  Administered 2023-03-31 – 2023-04-02 (×4): 15 mg/h via INTRAVENOUS
  Filled 2023-03-31 (×4): qty 20

## 2023-03-31 MED ORDER — POTASSIUM CHLORIDE CRYS ER 20 MEQ PO TBCR
40.0000 meq | EXTENDED_RELEASE_TABLET | Freq: Once | ORAL | Status: AC
  Administered 2023-03-31: 40 meq via ORAL
  Filled 2023-03-31: qty 2

## 2023-03-31 MED ORDER — PERFLUTREN LIPID MICROSPHERE
1.0000 mL | INTRAVENOUS | Status: AC | PRN
  Administered 2023-03-31: 2 mL via INTRAVENOUS

## 2023-03-31 NOTE — Assessment & Plan Note (Signed)
 High sensitive troponin elevation due to heart failure. Ruled out acute coronary syndrome.  No antiplatelet therapy due to full anticoagulation.  Continue with apixaban.

## 2023-03-31 NOTE — Plan of Care (Signed)
  Problem: Coping: Goal: Ability to adjust to condition or change in health will improve Outcome: Progressing   Problem: Coping: Goal: Level of anxiety will decrease Outcome: Progressing   Problem: Safety: Goal: Ability to remain free from injury will improve Outcome: Progressing

## 2023-03-31 NOTE — Assessment & Plan Note (Addendum)
 Glucose has been stable, no insulin therapy required. Continue statin therapy.  Hgb A1c 5.8

## 2023-03-31 NOTE — Progress Notes (Signed)
 Progress Note   Patient: [name]:[id number] DOB: 06/05/1955 DOA: 03/30/2023     1 DOS: the patient was seen and examined on 03/31/2023   Brief hospital course: [name] was admitted to the hospital with the working diagnosis of heart failure exacerbation.   68 yo male with the past medical history of heart failure, hypertension, T2DM, atrial fibrillation, pulmonary hypertension, gout and chronic venous statis ulcers lower extremities, who presented with dyspnea. Reported 3 days of worsening dyspnea on exertion. Positive dietary indiscretions. On his initial physical examination his blood pressure was 78/49, HR 71, RR 21 and 02 saturation 97%.  Lungs with no wheezing or rhonchi, heart with S1 and S2 present and regular, abdomen with no distention, positive lower extremity edema +++.   Na 134, K 3,9 Cl 99, bicarbonate 24, glucose 137, bun 18 cr 1,18 BNP 670  High sensitive troponin 55, 72, 83  Wbc 10,5 hgb 14.7 plt 142  Sars covid 19 negative  Influenza negative   Chest radiograph with hypoinflation, cardiomegaly, bilateral hilar vascular congestion, bilateral interstitial infiltrates, central predominance, pacemaker in place with right atrial and right ventricle leads.   EKG 73 bpm, left axis deviation, qtc 473, ventricular paced rhythm with no significant ST segment or T wave changes.   Patient was placed on furosemide for diuresis.    Assessment and Plan: * Acute on chronic systolic (congestive) heart failure (HCC) 2023 echocardiogram with reduced LV systolic function with EF 30%, global hypokinesis, RV systolic function with moderate reduction, RV with moderate enlargement, RA and LA with moderate dilatation, RVSP 52,7 mmHg. No significant valvular disease.   Documented urine output is 560 ml Systolic blood pressure is 115 mmHg range.  Continue volume overloaded.   Plan to continue IV furosemide with continuous infusion at 15 mg per hr.  Entresto and SGLT 2 inh,  Follow  up on repeat echocardiogram.   Acute cardiogenic pulmonary edema with acute hypoxemic respiratory failure.  No clinical signs of pulmonary infection, pneumonia is ruled out, will discontinue antibiotic therapy.   Atrial fibrillation, chronic (HCC) Rate is controlled, he has a ventricular paced rhythm.  Continue anticoagulation with apixaban.  Continue telemetry monitoring.   Coronary artery disease High sensitive troponin elevation due to heart failure. Ruled out acute coronary syndrome.  No antiplatelet therapy due to full anticoagulation.  Continue with apixaban.   AKI (acute kidney injury) (HCC) Renal function with serum cr at 1,21 with K at 3,8 and serum bicarbonate at 24  Na 138   Continue diuresis with furosemide and SGLT 2 inh.  Continue close follow up on renal function and electrolytes.   Type 2 diabetes mellitus with hyperlipidemia (HCC) Glucose has been stable, will discontinue insulin therapy. Continue statin therapy.  Hgb A1c 5.8   Lymphoma (HCC) Follow up as outpatient.   Class 3 obesity Calculated BMI is 48,3   Right medial ankle wound, continue with local wound care, no signs of local infection.         Subjective: Patient continue to have dyspnea and edema, improved but not back to baseline, no chest pain.   Physical Exam: Vitals:   03/30/23 2309 03/30/23 2317 03/31/23 0511 03/31/23 0732  BP: 115/68  122/79 104/63  Pulse: 70 73 73 70  Resp: 17 16 16 18   Temp: 98.7 F (37.1 C)  97.9 F (36.6 C) 97.9 F (36.6 C)  TempSrc: Oral  Oral Oral  SpO2:  96% 100% 99%  Weight:   (!) 157.2 kg  Height:       Neurology awake and alert ENT with mild pallor Cardiovascular with S1 and S2 present and regular with no gallops or rubs, no murmurs No JVD Positive lower extremity edema +++  Respiratory with bilateral rales with no wheezing or rhonchi Abdomen with no distention  Data Reviewed:    Family Communication:  her friend is at the bedside    Disposition: Status is: Inpatient Remains inpatient appropriate because: heart failure   Planned Discharge Destination: Home      Author: Coralie Keens, MD 03/31/2023 1:34 PM  For on call review [email or web address].

## 2023-03-31 NOTE — Assessment & Plan Note (Deleted)
 Patient sp pacemaker.

## 2023-03-31 NOTE — Assessment & Plan Note (Addendum)
 Hyponatremia.   Renal function with serum cr at 1,14 with K at 3,7 and serum bicarbonate at 28  Na 133 and Mg 1,8   Add Kcl 40 meq x 2 doses to avoid hypokalemia Add 2 g mag sulfate.   Continue diuresis with furosemide and SGLT 2 inh.  Continue close follow up on renal function and electrolytes.

## 2023-03-31 NOTE — Hospital Course (Addendum)
 [name] was admitted to the hospital with the working diagnosis of heart failure exacerbation.   68 yo male with the past medical history of heart failure, hypertension, T2DM, atrial fibrillation, pulmonary hypertension, gout, obesity class 3 and chronic venous statis ulcers of his lower extremities, who presented with dyspnea. Reported 3 days of worsening dyspnea on exertion. Positive dietary indiscretions. On his initial physical examination his blood pressure was 78/49, HR 71, RR 21 and 02 saturation 97%.  Lungs with no wheezing or rhonchi, heart with S1 and S2 present and regular, abdomen with no distention, positive lower extremity edema +++.   Na 134, K 3,9 Cl 99, bicarbonate 24, glucose 137, bun 18 cr 1,18 BNP 670  High sensitive troponin 55, 72, 83  Wbc 10,5 hgb 14.7 plt 142  Sars covid 19 negative  Influenza negative   Chest radiograph with hypoinflation, cardiomegaly, bilateral hilar vascular congestion, bilateral interstitial infiltrates, central predominance, pacemaker in place with right atrial and right ventricle leads.   EKG 73 bpm, left axis deviation, qtc 473, ventricular paced rhythm with no significant ST segment or T wave changes.   Patient was placed on furosemide for diuresis.   03/22 continue volume overload, having gout flare right foot.  03/23 improving volume status, holding IV furosemide and plan to transition to po loop diuretic in the next 24 hrs.  03/24 adequate diuresis, patient will continue with torsemide daily at home and will need close follow up as outpatient.

## 2023-03-31 NOTE — Progress Notes (Signed)
  Echocardiogram 2D Echocardiogram has been performed.  Leda Roys RDCS 03/31/2023, 11:39 AM

## 2023-03-31 NOTE — Assessment & Plan Note (Addendum)
 Calculated BMI is 48,3   Right medial ankle wound, continue with local wound care, no signs of local infection.   Acute gout, continue with colchicine.

## 2023-03-31 NOTE — Consult Note (Addendum)
 WOC Nurse Consult Note: patient is seen at wound care center for R medial ankle wound; last prescribed wound care there Hydrofera Blue and K2 lite compression wrap neither of which are on formulary at [facility]  Reason for Consult: R inner ankle wound  Wound type: full thickness R medial ankle r/t venous insufficiency  Pressure Injury POA: NA  Measurement: see nursing flowsheet; last measurements WCC note 03/22/2023 1.5 cm x 0.4 cm x 0.1 cm post debridement  Wound bed: 75% yellow 25% red moist per wound care center photo documentation  Drainage (amount, consistency, odor) see nursing flowsheet  Periwound: per nursing flowsheet  Dressing procedure/placement/frequency: Cleanse R medial ankle wound with Vashe wound cleanser [employer] [place] [phone]), do not rinse and allow to air dry.  Cover with a piece of silver hydrdfiber [employer] [place] [phone]) cut to fit wound bed, cover with silicone foam or dry gauze and Kerlix roll gauze whichever is preferred.    Patient should continue follow-up with wound care center and resume wound care prescribed by them at discharge.   Addendum: per bedside staff patient is refusing inpatient wound care, says his K2 compression wrap was placed Wednesday 3/19 and hopes to be discharged to have replaced at wound care center next Wednesday.  This sounds reasonable at this time, if patient remains inpatient beyond Wednesday 04/05/2023 would cut off current compression wrap, apply silver to wound bed as per wound care orders and have ortho tech apply unna boot to right leg. Have discussed this plan with bedside nurse and primary MD.   WOC team will not follow. Re-consult if further needs arise.   Thank you,    Priscella Mann MSN, RN-BC, [employer] [phone]

## 2023-03-31 NOTE — Assessment & Plan Note (Addendum)
 Echocardiogram with reduced LV systolic function with EF 25 ro 30%, global hypokinesis, moderate LVH, RV not well visualized, no significant valvular disease,   Urine output 4,600 ml  Systolic blood pressure is 115 mmHg range.   Furosemide  infusion at 15 mg per hr.  Entresto and SGLT 2 inh,   Acute cardiogenic pulmonary edema with acute hypoxemic respiratory failure.  No clinical signs of pulmonary infection, pneumonia is ruled out, will discontinue antibiotic therapy.

## 2023-03-31 NOTE — Assessment & Plan Note (Addendum)
 Rate is controlled, he has a ventricular paced rhythm.  Continue anticoagulation with apixaban and rate control with carvedilol.

## 2023-03-31 NOTE — Assessment & Plan Note (Deleted)
 Renal function with serum cr at 1,21 with K at 3,8 and serum bicarbonate at 24  Na 138   Continue diuresis with furosemide and SGLT 2 inh.  Continue close follow up on renal function and electrolytes.

## 2023-03-31 NOTE — Assessment & Plan Note (Signed)
 Follow-up as outpatient

## 2023-03-31 NOTE — Progress Notes (Addendum)
 Advanced Heart Failure Rounding Note  Cardiologist: Nanetta Batty, MD  HF Cardiologist: Dr. Gala Romney  Chief Complaint: Acute on chronic biventricular HFrEF  Subjective:    Reports sluggish response to 80 mg IV lasix last night. Put out about 500 cc.  Nausea improving. Ongoing leg swelling and shortness of breath.   Objective:   Weight Range: (!) 157.2 kg Body mass index is 48.34 kg/m.   Vital Signs:   Temp:  [97.9 F (36.6 C)-99.5 F (37.5 C)] 97.9 F (36.6 C) (03/21 0732) Pulse Rate:  [67-77] 70 (03/21 0732) Resp:  [14-21] 18 (03/21 0732) BP: (77-125)/(49-86) 104/63 (03/21 0732) SpO2:  [87 %-100 %] 99 % (03/21 0732) FiO2 (%):  [100 %] 100 % (03/20 0948) Weight:  [156.5 kg-158.9 kg] 157.2 kg (03/21 0511) Last BM Date : 06/30/23  Weight change: Filed Weights   03/30/23 0950 03/30/23 1833 03/31/23 0511  Weight: (!) 156.5 kg (!) 158.9 kg (!) 157.2 kg    Intake/Output:   Intake/Output Summary (Last 24 hours) at 03/31/2023 0909 Last data filed at 03/31/2023 0515 Gross per 24 hour  Intake 808.15 ml  Output 560 ml  Net 248.15 ml      Physical Exam    General:  Fatigued appearing Neck: JVP to jaw Cor: Regular rate & rhythm. No rubs, gallops or murmurs. Lungs: Diminished Abdomen: obese, soft, nontender, nondistended.  Extremities: Tense b/l LE edema, compression dressing RLE Neuro: Alert & orientedx3. Affect pleasant   Telemetry   V paced 70 with underlying Afib, ~ 5 PVCs/min   Labs    CBC Recent Labs    03/30/23 0947  WBC 10.5  NEUTROABS 9.1*  HGB 14.7  HCT 44.6  MCV 103.7*  PLT 142*   Basic Metabolic Panel Recent Labs    84/69/62 0947 03/31/23 0308  NA 134* 138  K 3.9 3.8  CL 99 99  CO2 24 24  GLUCOSE 137* 104*  BUN 18 20  CREATININE 1.18 1.21  CALCIUM 8.6* 8.7*   Liver Function Tests Recent Labs    03/30/23 0947  AST 23  ALT 14  ALKPHOS 104  BILITOT 1.3*  PROT 6.8  ALBUMIN 3.3*   No results for input(s): "LIPASE",  "AMYLASE" in the last 72 hours. Cardiac Enzymes No results for input(s): "CKTOTAL", "CKMB", "CKMBINDEX", "TROPONINI" in the last 72 hours.  BNP: BNP (last 3 results) Recent Labs    05/02/22 1503 01/30/23 1514 03/30/23 1006  BNP 586.5* 157.7* 670.9*    ProBNP (last 3 results) No results for input(s): "PROBNP" in the last 8760 hours.   D-Dimer No results for input(s): "DDIMER" in the last 72 hours. Hemoglobin A1C Recent Labs    03/30/23 1742  HGBA1C 5.8*   Fasting Lipid Panel No results for input(s): "CHOL", "HDL", "LDLCALC", "TRIG", "CHOLHDL", "LDLDIRECT" in the last 72 hours. Thyroid Function Tests No results for input(s): "TSH", "T4TOTAL", "T3FREE", "THYROIDAB" in the last 72 hours.  Invalid input(s): "FREET3"  Other results:   Imaging    DG Chest 2 View Result Date: 03/30/2023 CLINICAL DATA:  cough, dyspnea. EXAM: CHEST - 2 VIEW COMPARISON:  09/02/2021. FINDINGS: Low lung volume. Mild diffuse interstitial prominence. It is uncertain whether this represents artifactual "crowding" of the interstitium related to suboptimal inspiration versus increased interstitial markings, which can be due to pulmonary edema, atypical pneumonia, etc. Bilateral lung fields are otherwise clear. Bilateral costophrenic angles are clear. Stable cardio-mediastinal silhouette. There is a left sided 2-lead pacemaker. No acute osseous abnormalities. The soft  tissues are within normal limits. IMPRESSION: Mild diffuse interstitial prominence, as discussed above. Otherwise no acute cardiopulmonary abnormality. Electronically Signed   By: Jules Schick M.D.   On: 03/30/2023 10:39     Medications:     Scheduled Medications:  apixaban  5 mg Oral BID   ascorbic acid  500 mg Oral BID   atorvastatin  10 mg Oral Daily   cholecalciferol  1,000 Units Oral Daily   collagenase   Topical Daily   cyanocobalamin  1,000 mcg Oral Daily   dapagliflozin propanediol  10 mg Oral Daily   furosemide  80 mg  Intravenous BID   insulin aspart  0-15 Units Subcutaneous TID WC   omega-3 acid ethyl esters  1 g Oral Daily   pantoprazole  40 mg Oral Daily   sertraline  50 mg Oral Daily   sodium chloride flush  3 mL Intravenous Q12H    Infusions:  sodium chloride     ampicillin-sulbactam (UNASYN) IV 3 g (03/31/23 0838)   sodium chloride      PRN Medications: sodium chloride, acetaminophen, albuterol, colchicine, ondansetron (ZOFRAN) IV, sodium chloride flush    Patient Profile  [name] is a 68 year old with a history of chronic biventricular HFrEF, chronic  aifb, OSA, HTN, HLD, HTN, DMII, OSA, ILD, tobacco abuse, ILD, CAD and chemo RCHOP for lymphoma in 2011.    Presented with increased shortness of breath, cough, nausea, and vomiting.   Assessment/Plan  1. A/C Biventricular HFrEF -Mixed NICM/ICM diffuse CAD on cath 08/23. PYP equivocal 10/23.  -Echo 12/23 30%, RV moderately reduced -Has Medtronic CRT-D. Impedance low. Fluid index well above threshold since 03/06/23. Interrogation suggestive of fluid accumulation.  -Lactic acid 1.4  -Suboptimal response to IV lasix. Just received 80 mg lasix IV. Start lasix gtt at 15/hr. If UOP not picking up, will give metolazone later today. -Initially hypotensive. BP improved. Restart home entresto and spiro -Hold home coreg with acute exacerbation -Continue Farxiga  -Repeat echo pending  2. Acute hypoxic respiratory failure - Possibly d/t #1 - Covering with unasyn for possible aspiration PNA. Consider alternative to reduce sodium load. Believe presentation more likely d/t CHF.   2. Nausea/Vomiting  -? Viral vs related to meat he ate the night before vs due to HF -Blood Cx pending. No leukocytosis or fevers   3. Hypotension -Resolved -Restart home medications as above   4. OSA -Continue nightly CPAP   5. Chronic A fib -Rate controlled.  -Continue eliquis.    Length of Stay: 1  FINCH, LINDSAY N, PA-C  03/31/2023, 9:09 AM  Advanced  Heart Failure Team Pager [phone] (M-F; 7a - 5p)  Please contact [facility] for night-coverage after hours (5p -7a ) and weekends on [email or web address]   Patient seen with PA, I formulated the plan and agree with the above note.   Patient does not appear to have diuresed a significant amount yesterday though unclear if I/Os are complete.  Creatinine 1.21 today.   General: NAD Neck: Thick JVP 14-16 cm, no thyromegaly or thyroid nodule.  Lungs: Clear to auscultation bilaterally with normal respiratory effort. CV: Nondisplaced PMI.  Heart regular S1/S2, no S3/S4, no murmur.  1+ edema to knees.  Abdomen: Soft, nontender, no hepatosplenomegaly, no distention.  Skin: Intact without lesions or rashes.  Neurologic: Alert and oriented x 3.  Psych: Normal affect. Extremities: No clubbing or cyanosis.  HEENT: Normal.   Acute/chronic systolic CHF, NICM.  Patient is markedly volume overloaded on exam.  Warm/well-perfused, doubt low output.   - Echo today.  - Lasix 80 mg IV x 1 then start Lasix gtt 15 mg/hr.  Replace K.  - Can restart spironolactone 12.5 daily.  - Can restart Entresto 24/26 bid.  - Continue Farxiga.  - Hold Coreg for now with marked volume overload.   He is in permanent AF with BiV pacing.  Continue apixaban.   He is on Unasyn for aspiration PNA.  I suspect that the main issue here is CHF.  Afebrile with normal WBCs, think we could stop Unasyn.   Marca Ancona 03/31/2023 10:43 AM

## 2023-03-31 NOTE — TOC Initial Note (Signed)
 Transition of Care [facility]) - Initial/Assessment Note    Patient Details  Name: [name] MRN: [id number] Date of Birth: 1955-03-29  [facility] CM/SW Contact:    Nicanor Bake Phone Number: [phone] 03/31/2023, 2:32 PM  Clinical Narrative:    HF CSW met with pt and his friend at bedside. Pt stated that he lives alone. Pt stated that he has a history of HH services back in 2019. Pt stated that he uses a CPAP, cane, and walker. Pt stated that he has a scale at home. Pt stated he has a PCP.CSW explained that a hospital follow up appointment is typically scheduled closer towards dc. Pt agrees, and requested appts. On Monday afternoons.   TOC will continue following.              Expected Discharge Plan: Home/Self Care Barriers to Discharge: Continued Medical Work up   Patient Goals and CMS Choice Patient states their goals for this hospitalization and ongoing recovery are:: getting better          Expected Discharge Plan and Services       Living arrangements for the past 2 months: Apartment                                      Prior Living Arrangements/Services Living arrangements for the past 2 months: Apartment Lives with:: Self Patient language and need for interpreter reviewed:: Yes Do you feel safe going back to the place where you live?: Yes      Need for Family Participation in Patient Care: No (Comment) Care giver support system in place?: Yes (comment)   Criminal Activity/Legal Involvement Pertinent to Current Situation/Hospitalization: No - Comment as needed  Activities of Daily Living   ADL Screening (condition at time of admission) Independently performs ADLs?: Yes (appropriate for developmental age) Is the patient deaf or have difficulty hearing?: No Does the patient have difficulty seeing, even when wearing glasses/contacts?: No Does the patient have difficulty concentrating, remembering, or making decisions?:  No  Permission Sought/Granted                  Emotional Assessment Appearance:: Appears stated age Attitude/Demeanor/Rapport: Engaged Affect (typically observed): Appropriate Orientation: : Oriented to Self, Oriented to Place, Oriented to  Time, Oriented to Situation Alcohol / Substance Use: Not Applicable Psych Involvement: No (comment)  Admission diagnosis:  CHF exacerbation (HCC) [I50.9] Acute respiratory failure with hypoxia (HCC) [J96.01] Patient Active Problem List   Diagnosis Date Noted   CHF exacerbation (HCC) 03/30/2023   Allergic rhinitis 09/26/2022   ICD (implantable cardioverter-defibrillator) in place 03/09/2022   Complete heart block (HCC) 09/03/2021   Atrial fibrillation, chronic (HCC) 09/03/2021   AKI (acute kidney injury) (HCC) 09/03/2021   Coronary artery disease 09/03/2021   Lymphoma (HCC) 09/03/2021   Type 2 diabetes mellitus with hyperlipidemia (HCC) 09/03/2021   Class 3 obesity 09/03/2021   Acute on chronic systolic (congestive) heart failure (HCC) 08/26/2021   Type 2 diabetes mellitus with peripheral angiopathy (HCC) 03/08/2021   Hypercoagulable state (HCC) 03/08/2021   Traumatic amputation of toe or toes without complication (HCC) 03/08/2021   Hyponatremia    Chronic systolic congestive heart failure (HCC)    Prediabetes    Essential hypertension    Postoperative pain    Toe ulcer, right (HCC) 08/21/2020   Status post transmetatarsal amputation of left foot (HCC) 08/18/2020  PAD (peripheral artery disease) (HCC) 08/15/2020   S/P transmetatarsal amputation of foot, right (HCC) 08/11/2020   Gangrene (HCC) 08/04/2020   Permanent atrial fibrillation (HCC) 07/28/2020   Chronic systolic HF (heart failure) (HCC) 07/21/2019   Cough 07/12/2019   Educated about COVID-19 virus infection 05/12/2019   Non-pressure chronic ulcer of other part of left foot with unspecified severity (HCC) 04/11/2019   Pain in limb 04/11/2019   Adverse effect of other  vaccines and biological substances, initial encounter 04/10/2019   COVID-19 virus infection 08/07/2018   Port-A-Cath in place 01/08/2018   History of colonic polyps 12/11/2017   Thrombophilia (HCC) 10/30/2017   Heart failure (HCC) 07/04/2017   Dyspnea on exertion 06/19/2017   Chronic anticoagulation 05/22/2017   Cardiomyopathy (HCC) 05/22/2017   Hypotension 05/22/2017   RBBB 05/22/2017   UTI due to extended-spectrum beta lactamase (ESBL) producing Escherichia coli 04/06/2017   Debility    Wound infection    OSA (obstructive sleep apnea)    Acute systolic heart failure (HCC)    Morbid obesity (HCC)    Diabetes mellitus, new onset (HCC)    Dysuria    ILD (interstitial lung disease) (HCC)    Influenza A with pneumonia    Anxiety    Current moderate episode of major depressive disorder without prior episode (HCC)    Obesity, Class III, BMI 40-49.9 (morbid obesity) (HCC)    Critical illness myopathy    Dysphagia    Non-Hodgkin's lymphoma (HCC)    Leukocytosis    Hypokalemia    Tracheostomy status (HCC)    Pressure injury of skin 02/28/2017   Status post tracheostomy (HCC)    Follicular lymphoma grade II of intrathoracic lymph nodes (HCC) 05/02/2016   Port-A-Cath in place 05/04/2015   Localized, primary osteoarthritis of hand 09/08/2014   Atherosclerotic heart disease of native coronary artery without angina pectoris 08/26/2013   Tobacco user 07/30/2012   Gout attack 01/16/2012   Psoriasis 07/25/2011   Follicular lymphoma grade II (HCC) 12/16/2010   Personal history of non-Hodgkin lymphomas 09/20/2010   Peripheral venous insufficiency 11/16/2009   Encounter for general adult medical examination without abnormal findings 06/16/2009   Hyperlipidemia 06/16/2009   Impaired fasting glucose 06/16/2009   Proteinuria 06/16/2009   Varicose veins of other specified sites 06/16/2009   PCP:  [name], MD Pharmacy:   [phone] - [place], [place] - [address] [place] [address] Phone: [phone] Fax: [phone]  [facility] [address] [place] [address] Phone: [phone] Fax: [phone]     Social Drivers of Health (SDOH) Social History: SDOH Screenings   Food Insecurity: No Food Insecurity (03/30/2023)  Housing: Low Risk  (03/30/2023)  Transportation Needs: No Transportation Needs (03/30/2023)  Utilities: Not At Risk (03/30/2023)  Depression (PHQ2-9): Low Risk  (10/05/2020)  Social Connections: Moderately Isolated (03/30/2023)  Tobacco Use: Medium Risk (03/30/2023)   SDOH Interventions:     Readmission Risk Interventions     No data to display

## 2023-04-01 DIAGNOSIS — I482 Chronic atrial fibrillation, unspecified: Secondary | ICD-10-CM | POA: Diagnosis not present

## 2023-04-01 DIAGNOSIS — N179 Acute kidney failure, unspecified: Secondary | ICD-10-CM | POA: Diagnosis not present

## 2023-04-01 DIAGNOSIS — I5023 Acute on chronic systolic (congestive) heart failure: Secondary | ICD-10-CM | POA: Diagnosis not present

## 2023-04-01 DIAGNOSIS — I2510 Atherosclerotic heart disease of native coronary artery without angina pectoris: Secondary | ICD-10-CM | POA: Diagnosis not present

## 2023-04-01 LAB — BASIC METABOLIC PANEL
Anion gap: 11 (ref 5–15)
BUN: 17 mg/dL (ref 8–23)
CO2: 28 mmol/L (ref 22–32)
Calcium: 8.6 mg/dL — ABNORMAL LOW (ref 8.9–10.3)
Chloride: 94 mmol/L — ABNORMAL LOW (ref 98–111)
Creatinine, Ser: 1.14 mg/dL (ref 0.61–1.24)
GFR, Estimated: >60 mL/min (ref >60)
Glucose, Bld: 115 mg/dL — ABNORMAL HIGH (ref 70–99)
Potassium: 3.7 mmol/L (ref 3.5–5.1)
Sodium: 133 mmol/L — ABNORMAL LOW (ref 135–145)

## 2023-04-01 LAB — MAGNESIUM: Magnesium: 1.8 mg/dL (ref 1.7–2.4)

## 2023-04-01 MED ORDER — COLCHICINE 0.6 MG PO TABS
0.6000 mg | ORAL_TABLET | Freq: Two times a day (BID) | ORAL | Status: DC
  Administered 2023-04-02: 0.6 mg via ORAL
  Filled 2023-04-01 (×4): qty 1

## 2023-04-01 MED ORDER — PREDNISONE 20 MG PO TABS
40.0000 mg | ORAL_TABLET | Freq: Every day | ORAL | Status: AC
  Administered 2023-04-01 – 2023-04-03 (×3): 40 mg via ORAL
  Filled 2023-04-01 (×3): qty 2

## 2023-04-01 MED ORDER — MAGNESIUM SULFATE 2 GM/50ML IV SOLN
2.0000 g | Freq: Once | INTRAVENOUS | Status: AC
  Administered 2023-04-01: 2 g via INTRAVENOUS
  Filled 2023-04-01: qty 50

## 2023-04-01 MED ORDER — POTASSIUM CHLORIDE CRYS ER 20 MEQ PO TBCR
40.0000 meq | EXTENDED_RELEASE_TABLET | Freq: Once | ORAL | Status: AC
  Administered 2023-04-01: 40 meq via ORAL
  Filled 2023-04-01: qty 2

## 2023-04-01 MED ORDER — COLCHICINE 0.6 MG PO TABS: 0.6000 mg | ORAL_TABLET | Freq: Two times a day (BID) | ORAL | Status: DC

## 2023-04-01 NOTE — Progress Notes (Addendum)
 Advanced Heart Failure Rounding Note  Cardiologist: Nanetta Batty, MD  HF Cardiologist: Dr. Gala Romney  Chief Complaint: Acute on chronic biventricular HFrEF  Subjective:    Started on lasix gtt yesterday. Urine output dramatically improved with 4.6L out.   Weight down 9 pounds. Scr stable   Echo 3/21 EF 25-30% (stable)  Personally reviewed  Breathing better. Denies CP.   Developed gout in his feet overnight with severe pain   Objective:   Weight Range: (!) 153.2 kg Body mass index is 47.11 kg/m.   Vital Signs:   Temp:  [98.6 F (37 C)-100.6 F (38.1 C)] 98.6 F (37 C) (03/22 0833) Pulse Rate:  [68-71] 68 (03/22 0502) Resp:  [17-19] 18 (03/22 0833) BP: (99-130)/(61-71) 99/61 (03/22 0833) SpO2:  [96 %-100 %] 96 % (03/22 0502) Weight:  [153.2 kg] 153.2 kg (03/22 0502) Last BM Date : 03/30/23  Weight change: Filed Weights   03/30/23 1833 03/31/23 0511 04/01/23 0502  Weight: (!) 158.9 kg (!) 157.2 kg (!) 153.2 kg    Intake/Output:   Intake/Output Summary (Last 24 hours) at 04/01/2023 1139 Last data filed at 04/01/2023 1100 Gross per 24 hour  Intake 2078.83 ml  Output 3350 ml  Net -1271.17 ml      Physical Exam    General:  Obese male sitting up in bed No resp difficulty HEENT: normal Neck: supple. JVP to ear Carotids 2+ bilat; no bruits. Cor: irreg  Lungs: clear Abdomen: obese soft, nontender,+ distended. Good bowel sounds. Extremities: no cyanosis, clubbing, rash, 1+ edema + chronic venous stasis changes RLE wrapped Neuro: alert & orientedx3, cranial nerves grossly intact. moves all 4 extremities w/o difficulty. Affect pleasant   Telemetry   V paced underlying AF Personally reviewed   Labs    CBC Recent Labs    03/30/23 0947  WBC 10.5  NEUTROABS 9.1*  HGB 14.7  HCT 44.6  MCV 103.7*  PLT 142*   Basic Metabolic Panel Recent Labs    29/52/84 0308 04/01/23 0251  NA 138 133*  K 3.8 3.7  CL 99 94*  CO2 24 28  GLUCOSE 104* 115*   BUN 20 17  CREATININE 1.21 1.14  CALCIUM 8.7* 8.6*  MG  --  1.8   Liver Function Tests Recent Labs    03/30/23 0947  AST 23  ALT 14  ALKPHOS 104  BILITOT 1.3*  PROT 6.8  ALBUMIN 3.3*   No results for input(s): "LIPASE", "AMYLASE" in the last 72 hours. Cardiac Enzymes No results for input(s): "CKTOTAL", "CKMB", "CKMBINDEX", "TROPONINI" in the last 72 hours.  BNP: BNP (last 3 results) Recent Labs    05/02/22 1503 01/30/23 1514 03/30/23 1006  BNP 586.5* 157.7* 670.9*    ProBNP (last 3 results) No results for input(s): "PROBNP" in the last 8760 hours.   D-Dimer No results for input(s): "DDIMER" in the last 72 hours. Hemoglobin A1C Recent Labs    03/30/23 1742  HGBA1C 5.8*   Fasting Lipid Panel No results for input(s): "CHOL", "HDL", "LDLCALC", "TRIG", "CHOLHDL", "LDLDIRECT" in the last 72 hours. Thyroid Function Tests No results for input(s): "TSH", "T4TOTAL", "T3FREE", "THYROIDAB" in the last 72 hours.  Invalid input(s): "FREET3"  Other results:   Imaging    No results found.    Medications:     Scheduled Medications:  apixaban  5 mg Oral BID   ascorbic acid  500 mg Oral BID   atorvastatin  10 mg Oral Daily   cholecalciferol  1,000 Units Oral  Daily   cyanocobalamin  1,000 mcg Oral Daily   dapagliflozin propanediol  10 mg Oral Daily   omega-3 acid ethyl esters  1 g Oral Daily   pantoprazole  40 mg Oral Daily   sacubitril-valsartan  1 tablet Oral BID   sertraline  50 mg Oral Daily   sodium chloride flush  3 mL Intravenous Q12H   spironolactone  12.5 mg Oral Daily    Infusions:  furosemide (LASIX) 200 mg in dextrose 5 % 100 mL (2 mg/mL) infusion 15 mg/hr (03/31/23 2351)    PRN Medications: acetaminophen, albuterol, colchicine, ondansetron (ZOFRAN) IV, simethicone, sodium chloride flush    Patient Profile  Mr [name] is a 68 year old with a history of chronic biventricular HFrEF, chronic  aifb, OSA, HTN, HLD, HTN, DMII, OSA, ILD, tobacco  abuse, ILD, CAD and chemo RCHOP for lymphoma in 2011.    Presented with increased shortness of breath, cough, nausea, and vomiting.   Assessment/Plan   1. A/C Biventricular HFrEF -Mixed NICM/ICM diffuse CAD on cath 08/23. PYP equivocal 10/23.  -Echo 12/23 30%, RV moderately reduced -Has Medtronic CRT-D. Impedance low. Fluid index well above threshold since 03/06/23. Interrogation suggestive of fluid accumulation.  -Lactic acid 1.4  - Echo 03/31/23 EF 25-30% - Markedly volume overloaded. Sluggish response to bolus lasix. Now improved with lasix gtt at 15/hr. Will continue. Likely needs 1-2 more days  - b-locker on hold for possible low output - Continue Farxiga - Continue Entresto - Continue Spiro 12.5 - Consider potential RHC prior to d/c  2. Acute hypoxic respiratory failure - likely due primarily to HF - improving with diuresis   3. OSA -Continue nightly CPAP   4. Chronic A fib -Rate controlled.  -Continue Eliquis  5. Morbid obesity - Body mass index is 47.11 kg/m. - Consider GLP1RA  6. PAD with LE wounds - stable  7. Hypokalemia - supp - continue spiro  8. Acute gout - prednisone 40mg  daily x 3 days - d/w Dr. Liliane Channel of Stay: 2  Arvilla Meres, MD  04/01/2023, 11:39 AM  Advanced Heart Failure Team Pager [phone] (M-F; 7a - 5p)  Please contact [facility] for night-coverage after hours (5p -7a ) and weekends on [email or web address]

## 2023-04-01 NOTE — Plan of Care (Signed)
  Problem: Education: Goal: Ability to describe self-care measures that may prevent or decrease complications (Diabetes Survival Skills Education) will improve Outcome: Progressing   Problem: Coping: Goal: Ability to adjust to condition or change in health will improve Outcome: Progressing   Problem: Fluid Volume: Goal: Ability to maintain a balanced intake and output will improve Outcome: Progressing   Problem: Health Behavior/Discharge Planning: Goal: Ability to manage health-related needs will improve Outcome: Progressing   Problem: Skin Integrity: Goal: Risk for impaired skin integrity will decrease Outcome: Progressing   Problem: Education: Goal: Knowledge of General Education information will improve Description: Including pain rating scale, medication(s)/side effects and non-pharmacologic comfort measures Outcome: Progressing

## 2023-04-01 NOTE — Progress Notes (Signed)
 Progress Note   Patient: [name] XBJ:[id number] DOB: 10-08-1955 DOA: 03/30/2023     2 DOS: the patient was seen and examined on 04/01/2023   Brief hospital course: [name] was admitted to the hospital with the working diagnosis of heart failure exacerbation.   68 yo male with the past medical history of heart failure, hypertension, T2DM, atrial fibrillation, pulmonary hypertension, gout and chronic venous statis ulcers lower extremities, who presented with dyspnea. Reported 3 days of worsening dyspnea on exertion. Positive dietary indiscretions. On his initial physical examination his blood pressure was 78/49, HR 71, RR 21 and 02 saturation 97%.  Lungs with no wheezing or rhonchi, heart with S1 and S2 present and regular, abdomen with no distention, positive lower extremity edema +++.   Na 134, K 3,9 Cl 99, bicarbonate 24, glucose 137, bun 18 cr 1,18 BNP 670  High sensitive troponin 55, 72, 83  Wbc 10,5 hgb 14.7 plt 142  Sars covid 19 negative  Influenza negative   Chest radiograph with hypoinflation, cardiomegaly, bilateral hilar vascular congestion, bilateral interstitial infiltrates, central predominance, pacemaker in place with right atrial and right ventricle leads.   EKG 73 bpm, left axis deviation, qtc 473, ventricular paced rhythm with no significant ST segment or T wave changes.   Patient was placed on furosemide for diuresis.   03/22 continue volume overload, having gout flare right foot.   Assessment and Plan: * Acute on chronic systolic (congestive) heart failure (HCC) Echocardiogram with reduced LV systolic function with EF 25 ro 30%, global hypokinesis, moderate LVH, RV not well visualized, no significant valvular disease,   Urine output 4,600 ml  Systolic blood pressure is 115 mmHg range.   Furosemide  infusion at 15 mg per hr.  Entresto and SGLT 2 inh,   Acute cardiogenic pulmonary edema with acute hypoxemic respiratory failure.  No clinical signs of  pulmonary infection, pneumonia is ruled out, will discontinue antibiotic therapy.   Atrial fibrillation, chronic (HCC) Rate is controlled, he has a ventricular paced rhythm.  Continue anticoagulation with apixaban.  Continue telemetry monitoring.   Coronary artery disease High sensitive troponin elevation due to heart failure. Ruled out acute coronary syndrome.  No antiplatelet therapy due to full anticoagulation.  Continue with apixaban.   AKI (acute kidney injury) (HCC) Hyponatremia.   Renal function with serum cr at 1,14 with K at 3,7 and serum bicarbonate at 28  Na 133 and Mg 1,8   Add Kcl 40 meq x 2 doses to avoid hypokalemia Add 2 g mag sulfate.   Continue diuresis with furosemide and SGLT 2 inh.  Continue close follow up on renal function and electrolytes.   Type 2 diabetes mellitus with hyperlipidemia (HCC) Glucose has been stable, will discontinue insulin therapy. Continue statin therapy.  Hgb A1c 5.8   Lymphoma (HCC) Follow up as outpatient.   Class 3 obesity Calculated BMI is 48,3   Right medial ankle wound, continue with local wound care, no signs of local infection.   Acute gout, continue with colchicine.         Subjective: Patient with persistent dyspnea on exertion, with no chest pain, edema is improving but not back to baseline, positive foot pain on the right forefoot.   Physical Exam: Vitals:   04/01/23 0032 04/01/23 0502 04/01/23 0833 04/01/23 1156  BP: 125/71 130/69 99/61 113/68  Pulse: 69 68  70  Resp: 18 17 18  (!) 22  Temp: 99.1 F (37.3 C) 98.7 F (37.1 C) 98.6 F (  37 C) 99.7 F (37.6 C)  TempSrc: Oral Oral Oral Oral  SpO2: 97% 96%  99%  Weight:  (!) 153.2 kg    Height:       Neurology awake and alert ENT with mild pallor Cardiovascular with S1 and S2 present and regular with no gallops, or rubs No JVD  wide neck Respiratory with mild rales at he dependent zones on anterior auscultation  Abdomen with no distention  Positive  lower extremity edema ++  Data Reviewed:    Family Communication: no family at the bedside   Disposition: Status is: Inpatient Remains inpatient appropriate because: IV diuresis   Planned Discharge Destination: Home     Author: Coralie Keens, MD 04/01/2023 12:21 PM  For on call review [email or web address].

## 2023-04-01 NOTE — Plan of Care (Signed)
  Problem: Clinical Measurements: Goal: Diagnostic test results will improve Outcome: Progressing   Problem: Clinical Measurements: Goal: Will remain free from infection Outcome: Progressing   Problem: Clinical Measurements: Goal: Respiratory complications will improve Outcome: Progressing   Problem: Activity: Goal: Risk for activity intolerance will decrease Outcome: Progressing

## 2023-04-02 DIAGNOSIS — I2585 Chronic coronary microvascular dysfunction: Secondary | ICD-10-CM | POA: Diagnosis not present

## 2023-04-02 DIAGNOSIS — I482 Chronic atrial fibrillation, unspecified: Secondary | ICD-10-CM | POA: Diagnosis not present

## 2023-04-02 DIAGNOSIS — I5023 Acute on chronic systolic (congestive) heart failure: Secondary | ICD-10-CM | POA: Diagnosis not present

## 2023-04-02 DIAGNOSIS — N179 Acute kidney failure, unspecified: Secondary | ICD-10-CM | POA: Diagnosis not present

## 2023-04-02 LAB — BASIC METABOLIC PANEL WITH GFR
Anion gap: 9 (ref 5–15)
BUN: 25 mg/dL — ABNORMAL HIGH (ref 8–23)
CO2: 31 mmol/L (ref 22–32)
Calcium: 9.4 mg/dL (ref 8.9–10.3)
Chloride: 95 mmol/L — ABNORMAL LOW (ref 98–111)
Creatinine, Ser: 1.2 mg/dL (ref 0.61–1.24)
GFR, Estimated: >60 mL/min
Glucose, Bld: 159 mg/dL — ABNORMAL HIGH (ref 70–99)
Potassium: 4.1 mmol/L (ref 3.5–5.1)
Sodium: 135 mmol/L (ref 135–145)

## 2023-04-02 LAB — MAGNESIUM: Magnesium: 2.2 mg/dL (ref 1.7–2.4)

## 2023-04-02 MED ORDER — POTASSIUM CHLORIDE CRYS ER 20 MEQ PO TBCR
40.0000 meq | EXTENDED_RELEASE_TABLET | Freq: Once | ORAL | Status: AC
  Administered 2023-04-02: 40 meq via ORAL
  Filled 2023-04-02: qty 2

## 2023-04-02 NOTE — Plan of Care (Signed)
   Problem: Metabolic: Goal: Ability to maintain appropriate glucose levels will improve Outcome: Progressing   Problem: Skin Integrity: Goal: Risk for impaired skin integrity will decrease Outcome: Progressing   Problem: Education: Goal: Knowledge of General Education information will improve Description: Including pain rating scale, medication(s)/side effects and non-pharmacologic comfort measures Outcome: Progressing

## 2023-04-02 NOTE — Progress Notes (Signed)
 Advanced Heart Failure Rounding Note  Cardiologist: Nanetta Batty, MD  HF Cardiologist: Dr. Gala Romney  Chief Complaint: Acute on chronic biventricular HFrEF  Subjective:    Remains on lasix gtt at 15  Diuresed another 4L. Weight down another 2 pounds (15 pounds total) Denies CP or SOB  BP soft with systolics in 80s  Gout pain improved with prednisone  Scr stable at 1.2  Echo 3/21 EF 25-30% (stable)  Personally reviewed   Objective:   Weight Range: (!) 152.1 kg Body mass index is 46.77 kg/m.   Vital Signs:   Temp:  [97.7 F (36.5 C)-99.7 F (37.6 C)] 97.7 F (36.5 C) (03/23 0745) Pulse Rate:  [68-71] 69 (03/23 0745) Resp:  [17-22] 18 (03/23 0745) BP: (104-137)/(55-75) 104/65 (03/23 0745) SpO2:  [93 %-99 %] 96 % (03/23 0745) Weight:  [152.1 kg] 152.1 kg (03/23 0431) Last BM Date : 04/01/23  Weight change: Filed Weights   03/31/23 0511 04/01/23 0502 04/02/23 0431  Weight: (!) 157.2 kg (!) 153.2 kg (!) 152.1 kg    Intake/Output:   Intake/Output Summary (Last 24 hours) at 04/02/2023 0844 Last data filed at 04/02/2023 0657 Gross per 24 hour  Intake 1541.89 ml  Output 3955 ml  Net -2413.11 ml      Physical Exam   General: Obese male lying in bed. No resp difficulty HEENT: normal Neck: supple. no JVD. Carotids 2+ bilat; no bruits. No lymphadenopathy or thryomegaly appreciated. Cor: Regular rate & rhythm. No rubs, gallops or murmurs. Lungs: clear Abdomen:obese nontender, nondistended. No hepatosplenomegaly. No bruits or masses. Good bowel sounds. Extremities: no cyanosis, clubbing, rash, tr edema on R with venous stasis  RLE wrapped due to wound Neuro: alert & orientedx3, cranial nerves grossly intact. moves all 4 extremities w/o difficulty. Affect pleasant   Telemetry   V paced underlying AF Personally reviewed   Labs    CBC Recent Labs    03/30/23 0947  WBC 10.5  NEUTROABS 9.1*  HGB 14.7  HCT 44.6  MCV 103.7*  PLT 142*   Basic  Metabolic Panel Recent Labs    86/57/84 0251 04/02/23 0247  NA 133* 135  K 3.7 4.1  CL 94* 95*  CO2 28 31  GLUCOSE 115* 159*  BUN 17 25*  CREATININE 1.14 1.20  CALCIUM 8.6* 9.4  MG 1.8 2.2   Liver Function Tests Recent Labs    03/30/23 0947  AST 23  ALT 14  ALKPHOS 104  BILITOT 1.3*  PROT 6.8  ALBUMIN 3.3*   No results for input(s): "LIPASE", "AMYLASE" in the last 72 hours. Cardiac Enzymes No results for input(s): "CKTOTAL", "CKMB", "CKMBINDEX", "TROPONINI" in the last 72 hours.  BNP: BNP (last 3 results) Recent Labs    05/02/22 1503 01/30/23 1514 03/30/23 1006  BNP 586.5* 157.7* 670.9*    ProBNP (last 3 results) No results for input(s): "PROBNP" in the last 8760 hours.   D-Dimer No results for input(s): "DDIMER" in the last 72 hours. Hemoglobin A1C Recent Labs    03/30/23 1742  HGBA1C 5.8*   Fasting Lipid Panel No results for input(s): "CHOL", "HDL", "LDLCALC", "TRIG", "CHOLHDL", "LDLDIRECT" in the last 72 hours. Thyroid Function Tests No results for input(s): "TSH", "T4TOTAL", "T3FREE", "THYROIDAB" in the last 72 hours.  Invalid input(s): "FREET3"  Other results:   Imaging    No results found.    Medications:     Scheduled Medications:  apixaban  5 mg Oral BID   ascorbic acid  500 mg  Oral BID   atorvastatin  10 mg Oral Daily   cholecalciferol  1,000 Units Oral Daily   colchicine  0.6 mg Oral BID   cyanocobalamin  1,000 mcg Oral Daily   dapagliflozin propanediol  10 mg Oral Daily   omega-3 acid ethyl esters  1 g Oral Daily   pantoprazole  40 mg Oral Daily   predniSONE  40 mg Oral Q breakfast   sacubitril-valsartan  1 tablet Oral BID   sertraline  50 mg Oral Daily   sodium chloride flush  3 mL Intravenous Q12H   spironolactone  12.5 mg Oral Daily    Infusions:  furosemide (LASIX) 200 mg in dextrose 5 % 100 mL (2 mg/mL) infusion 15 mg/hr (04/02/23 0126)    PRN Medications: acetaminophen, albuterol, ondansetron (ZOFRAN) IV,  simethicone, sodium chloride flush    Patient Profile  [name] is a 68 year old with a history of chronic biventricular HFrEF, chronic  aifb, OSA, HTN, HLD, HTN, DMII, OSA, ILD, tobacco abuse, ILD, CAD and chemo [facility] for lymphoma in 2011.    Presented with increased shortness of breath, cough, nausea, and vomiting.   Assessment/Plan   1. A/C Biventricular HFrEF -Mixed NICM/ICM diffuse CAD on cath 08/23. PYP equivocal 10/23.  -Echo 12/23 30%, RV moderately reduced -Has Med[employer] CRT-D. Impedance low. Fluid index well above threshold since 03/06/23. Interrogation suggestive of fluid accumulation.  - Echo 03/31/23 EF 25-30% - Has diuresed well with lasix gtt. Weight down 15 pounds.  - He is now dry with low BP. Stop lasix  - b-locker on hold for possible low output - Continue Farxiga - Hold Entresto and spiro today. Restart on d/c   2. Acute hypoxic respiratory failure - likely due primarily to HF - resolved with diuresis   3. OSA -Continue nightly CPAP   4. Chronic A fib - Rate controlled -Continue Eliquis  5. Morbid obesity - Body mass index is 46.77 kg/m. - Consider GLP1RA  6. PAD with LE wounds - stable  7. Hypokalemia - K 4.1 today - continue spiro  8. Acute gout - prednisone 40mg  daily x 3 days. Today is day 2/3  Length of Stay: 3  Arvilla Meres, MD  04/02/2023, 8:44 AM  Advanced Heart Failure Team Pager [phone] (M-F; 7a - 5p)  Please contact [facility] for night-coverage after hours (5p -7a ) and weekends on [email or web address]

## 2023-04-02 NOTE — Progress Notes (Signed)
 Progress Note   Patient: [name]:[id number] DOB: 28-Mar-1955 DOA: 03/30/2023     3 DOS: the patient was seen and examined on 04/02/2023   Brief hospital course: [name] was admitted to the hospital with the working diagnosis of heart failure exacerbation.   68 yo male with the past medical history of heart failure, hypertension, T2DM, atrial fibrillation, pulmonary hypertension, gout and chronic venous statis ulcers lower extremities, who presented with dyspnea. Reported 3 days of worsening dyspnea on exertion. Positive dietary indiscretions. On his initial physical examination his blood pressure was 78/49, HR 71, RR 21 and 02 saturation 97%.  Lungs with no wheezing or rhonchi, heart with S1 and S2 present and regular, abdomen with no distention, positive lower extremity edema +++.   Na 134, K 3,9 Cl 99, bicarbonate 24, glucose 137, bun 18 cr 1,18 BNP 670  High sensitive troponin 55, 72, 83  Wbc 10,5 hgb 14.7 plt 142  Sars covid 19 negative  Influenza negative   Chest radiograph with hypoinflation, cardiomegaly, bilateral hilar vascular congestion, bilateral interstitial infiltrates, central predominance, pacemaker in place with right atrial and right ventricle leads.   EKG 73 bpm, left axis deviation, qtc 473, ventricular paced rhythm with no significant ST segment or T wave changes.   Patient was placed on furosemide for diuresis.   03/22 continue volume overload, having gout flare right foot.  03/23 improving volume status, holding IV furosemide and plan to transition to po loop diuretic in the next 24 hrs.   Assessment and Plan: * Acute on chronic systolic (congestive) heart failure (HCC) Echocardiogram with reduced LV systolic function with EF 25 ro 30%, global hypokinesis, moderate LVH, RV not well visualized, no significant valvular disease,   Urine output 4,105  ml  Systolic blood pressure is 90 mmHg range.   Holding Entresto and SGLT 2 inh due risk of hypotension.    Acute cardiogenic pulmonary edema with acute hypoxemic respiratory failure.  No clinical signs of pulmonary infection, pneumonia is ruled out, will discontinue antibiotic therapy.  02 saturation is 96% on 2 L [place] supplemental 02 per [place]   Atrial fibrillation, chronic (HCC) Rate is controlled, he has a ventricular paced rhythm.  Continue anticoagulation with apixaban.  Continue telemetry monitoring.   Coronary artery disease High sensitive troponin elevation due to heart failure. Ruled out acute coronary syndrome.  No antiplatelet therapy due to full anticoagulation.  Continue with apixaban.   AKI (acute kidney injury) (HCC) Hyponatremia.   Improved volume status.  Renal function today with serum cr at 1,20 with K at 4,1 and serum bicarbonate at 31  Na 135  Mg 2.2   Discontinue IV furosemide today.  Follow up renal function and electrolytes in am Add 40 meq KCl to avoid hypokalemia.   Type 2 diabetes mellitus with hyperlipidemia (HCC) Glucose has been stable, will discontinue insulin therapy. Continue statin therapy.  Hgb A1c 5.8   Lymphoma (HCC) Follow up as outpatient.   Class 3 obesity Calculated BMI is 48,3   Right medial ankle wound, continue with local wound care, no signs of local infection.   Acute gout, continue with colchicine and prednisone.  Pain has improved.         Subjective: Patient is feeling better, improved dyspnea and edema, no chest pain, right foot pain has improved as well.   Physical Exam: Vitals:   04/02/23 0442 04/02/23 0745 04/02/23 0855 04/02/23 1119  BP: 106/66 104/65 97/62 (!) 86/60  Pulse: 70 69  Resp: 18 18  18   Temp: 97.8 F (36.6 C) 97.7 F (36.5 C)  97.6 F (36.4 C)  TempSrc: Oral Oral  Oral  SpO2: 93% 96%    Weight:      Height:       Neurology awake and alert ENT with mild pallor Cardiovascular with S1 and S2 present and regular with no gallops, no rubs, positive systolic murmur at the apex.  Respiratory with  no rales or wheezing on anterior auscultation  Abdomen with no distention  Trace lower extremity edema  Data Reviewed:    Family Communication: no family at the bedside   Disposition: Status is: Inpatient Remains inpatient appropriate because: Heart failure   Planned Discharge Destination: Home     Author: Coralie Keens, MD 04/02/2023 2:08 PM  For on call review [email or web address].

## 2023-04-03 ENCOUNTER — Other Ambulatory Visit (HOSPITAL_COMMUNITY): Payer: Self-pay

## 2023-04-03 DIAGNOSIS — I482 Chronic atrial fibrillation, unspecified: Secondary | ICD-10-CM | POA: Diagnosis not present

## 2023-04-03 DIAGNOSIS — I5023 Acute on chronic systolic (congestive) heart failure: Secondary | ICD-10-CM | POA: Diagnosis not present

## 2023-04-03 DIAGNOSIS — I2585 Chronic coronary microvascular dysfunction: Secondary | ICD-10-CM | POA: Diagnosis not present

## 2023-04-03 DIAGNOSIS — N179 Acute kidney failure, unspecified: Secondary | ICD-10-CM | POA: Diagnosis not present

## 2023-04-03 LAB — BASIC METABOLIC PANEL
Anion gap: 11 (ref 5–15)
BUN: 30 mg/dL — ABNORMAL HIGH (ref 8–23)
CO2: 27 mmol/L (ref 22–32)
Calcium: 9.5 mg/dL (ref 8.9–10.3)
Chloride: 97 mmol/L — ABNORMAL LOW (ref 98–111)
Creatinine, Ser: 1.11 mg/dL (ref 0.61–1.24)
GFR, Estimated: >60 mL/min (ref >60)
Glucose, Bld: 125 mg/dL — ABNORMAL HIGH (ref 70–99)
Potassium: 4.1 mmol/L (ref 3.5–5.1)
Sodium: 135 mmol/L (ref 135–145)

## 2023-04-03 LAB — MAGNESIUM: Magnesium: 2.4 mg/dL (ref 1.7–2.4)

## 2023-04-03 MED ORDER — TORSEMIDE 20 MG PO TABS
40.0000 mg | ORAL_TABLET | Freq: Every day | ORAL | 0 refills | Status: DC
  Filled 2023-04-03: qty 80, 40d supply, fill #0

## 2023-04-03 MED ORDER — CARVEDILOL 3.125 MG PO TABS
3.1250 mg | ORAL_TABLET | Freq: Two times a day (BID) | ORAL | Status: DC
  Administered 2023-04-03: 3.125 mg via ORAL
  Filled 2023-04-03: qty 1

## 2023-04-03 MED ORDER — TORSEMIDE 20 MG PO TABS
40.0000 mg | ORAL_TABLET | Freq: Every day | ORAL | Status: DC
  Administered 2023-04-03: 40 mg via ORAL
  Filled 2023-04-03: qty 2

## 2023-04-03 NOTE — TOC Transition Note (Signed)
 Transition of Care [facility]) - Discharge Note   Patient Details  Name: [name] MRN: [id number] Date of Birth: May 26, 1955  [facility] CM/SW Contact:  Reva Bores, LCSWA Phone Number: 04/03/2023, 10:20 AM   Clinical Narrative:   10:19 AM- HF CSW called to schedule pts hospital follow up appointment, but left a VM asking to be called back to schedule. CSW left a VM on the PCP nurses line.   10:25 AM- HF CSW received a call back from the patients PCP. Morrie Sheldon, nurse stated that once the patient is discharged they will call to schedule.   HF CSW called and spoke with the patient over the phone who stated that his friend who is currently at bedside will be transporting him home today.        Barriers to Discharge: Continued Medical Work up   Patient Goals and CMS Choice Patient states their goals for this hospitalization and ongoing recovery are:: getting better          Discharge Placement                       Discharge Plan and Services Additional resources added to the After Visit Summary for                                       Social Drivers of Health (SDOH) Interventions SDOH Screenings   Food Insecurity: No Food Insecurity (03/30/2023)  Housing: Low Risk  (03/30/2023)  Transportation Needs: No Transportation Needs (03/30/2023)  Utilities: Not At Risk (03/30/2023)  Depression (PHQ2-9): Low Risk  (10/05/2020)  Social Connections: Moderately Isolated (03/30/2023)  Tobacco Use: Medium Risk (03/30/2023)     Readmission Risk Interventions     No data to display

## 2023-04-03 NOTE — Plan of Care (Signed)

## 2023-04-03 NOTE — Progress Notes (Signed)
 Advanced Heart Failure Rounding Note  Cardiologist: Nanetta Batty, MD  HF Cardiologist: Dr. Gala Romney  Chief Complaint: Acute on chronic biventricular HFrEF  Subjective:    Lasix stopped yesterday.   BP was low. Now improved.  Denies CP or SOB.   Scr stable  Echo 3/21 EF 25-30% (stable)  Personally reviewed   Objective:   Weight Range: (!) 150 kg Body mass index is 46.14 kg/m.   Vital Signs:   Temp:  [97.6 F (36.4 C)-98.7 F (37.1 C)] 97.6 F (36.4 C) (03/24 0435) Pulse Rate:  [69-74] 72 (03/24 0435) Resp:  [18] 18 (03/24 0435) BP: (86-119)/(60-79) 119/79 (03/24 0435) SpO2:  [90 %-100 %] 100 % (03/24 0435) Weight:  [150 kg] 150 kg (03/24 0435) Last BM Date : 04/01/23  Weight change: Filed Weights   04/01/23 0502 04/02/23 0431 04/03/23 0435  Weight: (!) 153.2 kg (!) 152.1 kg (!) 150 kg    Intake/Output:   Intake/Output Summary (Last 24 hours) at 04/03/2023 0736 Last data filed at 04/03/2023 0436 Gross per 24 hour  Intake 357 ml  Output 1250 ml  Net -893 ml      Physical Exam   General:  Obese male  No resp difficulty HEENT: normal Neck: supple. no JVD. Carotids 2+ bilat; no bruits. No lymphadenopathy or thryomegaly appreciated. Cor: PMI nondisplaced. Regular rate & rhythm. No rubs, gallops or murmurs. Lungs: clear Abdomen: obese soft, nontender, nondistended. No hepatosplenomegaly. No bruits or masses. Good bowel sounds. Extremities: no cyanosis, clubbing, rash, tr edema + left leg wrapped Neuro: alert & orientedx3, cranial nerves grossly intact. moves all 4 extremities w/o difficulty. Affect pleasant  Telemetry   V paced underlying AF Personally reviewed   Labs    CBC No results for input(s): "WBC", "NEUTROABS", "HGB", "HCT", "MCV", "PLT" in the last 72 hours.  Basic Metabolic Panel Recent Labs    16/10/96 0247 04/03/23 0231  NA 135 135  K 4.1 4.1  CL 95* 97*  CO2 31 27  GLUCOSE 159* 125*  BUN 25* 30*  CREATININE 1.20 1.11   CALCIUM 9.4 9.5  MG 2.2 2.4   Liver Function Tests No results for input(s): "AST", "ALT", "ALKPHOS", "BILITOT", "PROT", "ALBUMIN" in the last 72 hours.  No results for input(s): "LIPASE", "AMYLASE" in the last 72 hours. Cardiac Enzymes No results for input(s): "CKTOTAL", "CKMB", "CKMBINDEX", "TROPONINI" in the last 72 hours.  BNP: BNP (last 3 results) Recent Labs    05/02/22 1503 01/30/23 1514 03/30/23 1006  BNP 586.5* 157.7* 670.9*    ProBNP (last 3 results) No results for input(s): "PROBNP" in the last 8760 hours.   D-Dimer No results for input(s): "DDIMER" in the last 72 hours. Hemoglobin A1C No results for input(s): "HGBA1C" in the last 72 hours.  Fasting Lipid Panel No results for input(s): "CHOL", "HDL", "LDLCALC", "TRIG", "CHOLHDL", "LDLDIRECT" in the last 72 hours. Thyroid Function Tests No results for input(s): "TSH", "T4TOTAL", "T3FREE", "THYROIDAB" in the last 72 hours.  Invalid input(s): "FREET3"  Other results:   Imaging    No results found.    Medications:     Scheduled Medications:  apixaban  5 mg Oral BID   ascorbic acid  500 mg Oral BID   atorvastatin  10 mg Oral Daily   cholecalciferol  1,000 Units Oral Daily   colchicine  0.6 mg Oral BID   cyanocobalamin  1,000 mcg Oral Daily   dapagliflozin propanediol  10 mg Oral Daily   omega-3 acid ethyl esters  1 g Oral Daily   pantoprazole  40 mg Oral Daily   sacubitril-valsartan  1 tablet Oral BID   sertraline  50 mg Oral Daily   sodium chloride flush  3 mL Intravenous Q12H   spironolactone  12.5 mg Oral Daily    Infusions:    PRN Medications: acetaminophen, albuterol, ondansetron (ZOFRAN) IV, simethicone, sodium chloride flush    Patient Profile  [name] is a 68 year old with a history of chronic biventricular HFrEF, chronic  aifb, OSA, HTN, HLD, HTN, DMII, OSA, ILD, tobacco abuse, ILD, CAD and chemo [facility] for lymphoma in 2011.    Presented with increased shortness of breath,  cough, nausea, and vomiting.   Assessment/Plan   1. A/C Biventricular HFrEF -Mixed NICM/ICM diffuse CAD on cath 08/23. PYP equivocal 10/23.  -Echo 12/23 30%, RV moderately reduced -Has [employer]tr[employer] CRT-D. Impedance low. Fluid index well above threshold since 03/06/23. Interrogation suggestive of fluid accumulation.  - Echo 03/31/23 EF 25-30% - Has diuresed well with lasix gtt. Weight down 15 pounds.  - Volume status looks good. Scr stable  - Resume PTA meds including torsemide 40 daily. Can take extra as needed for volume overload  2. Acute hypoxic respiratory failure - likely due primarily to HF - resolved   3. OSA -Continue nightly CPAP   4. Chronic A fib - Rate controlled -Continue Eliquis  5. Morbid obesity - Body mass index is 46.14 kg/m. - Consider GLP1RA  6. PAD with LE wounds - stable  7. Hypokalemia - K 4.1 today - continue spiro  8. Acute gout - prednisone 40mg  daily x 3 days. Today is day 3/3   Ok for d/c today on home meds. Will arrange f/u in HF Clinic   Length of Stay: 4  [name] Meres, MD  04/03/2023, 7:36 AM  Advanced Heart Failure Team Pager [phone] (M-F; 7a - 5p)  Please contact [facility] for night-coverage after hours (5p -7a ) and weekends on [email or web address]

## 2023-04-03 NOTE — Discharge Summary (Signed)
 Physician Discharge Summary   Patient: [name] MRN: [id number] DOB: 01-01-1956  Admit date:     03/30/2023  Discharge date: 04/03/23  Discharge Physician: [name]   PCP: [name] Polka., MD   Recommendations at discharge:    Patient will resume his home medical therapy for heart failure, including carvedilol, Entresto, spironolactone and dapagliflozin. Torsemide 40 mg daily with plan to increase to 60 mg daily in case of volume overload, 2 to 3 lbs weight gain in 24 hrs or 5 lbs in 7 days.  Advised to be adherent to heart failure diet. Follow up renal function and electrolytes in 7 days as outpatient  Follow up with [name] in 7 to 10 days.  Follow up with Heart Failure clinic as scheduled.   Discharge Diagnoses: Principal Problem:   Acute on chronic systolic (congestive) heart failure (HCC) Active Problems:   Atrial fibrillation, chronic (HCC)   Coronary artery disease   AKI (acute kidney injury) (HCC)   Type 2 diabetes mellitus with hyperlipidemia (HCC)   Lymphoma (HCC)   Class 3 obesity  Resolved Problems:   * No resolved hospital problems. [facility] Course: [name] was admitted to the hospital with the working diagnosis of heart failure exacerbation.   68 yo male with the past medical history of heart failure, hypertension, T2DM, atrial fibrillation, pulmonary hypertension, gout, obesity class 3 and chronic venous statis ulcers of his lower extremities, who presented with dyspnea. Reported 3 days of worsening dyspnea on exertion. Positive dietary indiscretions. On his initial physical examination his blood pressure was 78/49, HR 71, RR 21 and 02 saturation 97%.  Lungs with no wheezing or rhonchi, heart with S1 and S2 present and regular, abdomen with no distention, positive lower extremity edema +++.   Na 134, K 3,9 Cl 99, bicarbonate 24, glucose 137, bun 18 cr 1,18 BNP 670  High sensitive troponin 55, 72, 83  Wbc 10,5 hgb 14.7 plt 142  Sars  covid 19 negative  Influenza negative   Chest radiograph with hypoinflation, cardiomegaly, bilateral hilar vascular congestion, bilateral interstitial infiltrates, central predominance, pacemaker in place with right atrial and right ventricle leads.   EKG 73 bpm, left axis deviation, qtc 473, ventricular paced rhythm with no significant ST segment or T wave changes.   Patient was placed on furosemide for diuresis.   03/22 continue volume overload, having gout flare right foot.  03/23 improving volume status, holding IV furosemide and plan to transition to po loop diuretic in the next 24 hrs.  03/24 adequate diuresis, patient will continue with torsemide daily at home and will need close follow up as outpatient.   Assessment and Plan: * Acute on chronic systolic (congestive) heart failure (HCC) Echocardiogram with reduced LV systolic function with EF 25 ro 30%, global hypokinesis, moderate LVH, RV not well visualized, no significant valvular disease,   Patient was placed on aggressive diuresis with IV furosemide, negative fluid balance was achieved, - 5,132 ml with significant improvement in his symptoms.  During his hospitalization he lost 8,9 Kg.   Patient will continue heart failure guideline medical therapy with entresto, spironolactone, SGLT 2 inh and carvedilol.  Diuresis with torsemide 40 mg daily, with instructions to take 60 mg in case of volume overload.   Acute cardiogenic pulmonary edema with acute hypoxemic respiratory failure.  No clinical signs of pulmonary infection, pneumonia was ruled out,. At the time of his discharge his 02 saturation on room air is 92%.  Atrial fibrillation, chronic (HCC) Rate is controlled, he has a ventricular paced rhythm.  Continue anticoagulation with apixaban and rate control with carvedilol.   Coronary artery disease High sensitive troponin elevation due to heart failure. Ruled out acute coronary syndrome.  No antiplatelet therapy due to  full anticoagulation.  Continue with apixaban.   AKI (acute kidney injury) (HCC) Hyponatremia.   At the time of his discharge his renal function has a serum cr of 1,1 with K at 4.1 and serum bicarbonate at 27  Na 135  Mg 2.4   Plan to follow up renal function and electrolytes as outpatient  Continue torsemide, SGLT 2 inh and spironolactone for diuresis,    Type 2 diabetes mellitus with hyperlipidemia (HCC) Glucose has been stable, no insulin therapy required. Continue statin therapy.  Hgb A1c 5.8   Lymphoma (HCC) Follow up as outpatient.   Class 3 obesity Calculated BMI is 48,3   Right medial ankle wound, continue with local wound care, no signs of local infection.   Acute gout flare, patient was placed on colchicine and prednisone with improvement in his symptoms,          Consultants: Cardiology  Procedures performed: none   Disposition: Home Diet recommendation:  Cardiac and Carb modified diet DISCHARGE MEDICATION: Allergies as of 04/03/2023       Reactions   Latex Hives, Swelling   Ace Inhibitors Other (See Comments)   Other reaction(s): cough   Penicillin G Hives, Rash   Has tolerated amoxicillin        Medication List     TAKE these medications    acetaminophen 325 MG tablet Commonly known as: TYLENOL Take 650 mg by mouth every 6 (six) hours as needed for moderate pain.   apixaban 5 MG Tabs tablet Commonly known as: Eliquis Take 1 tablet (5 mg total) by mouth 2 (two) times daily. NEEDS FOLLOW UP APPOINTMENT   ascorbic acid 500 MG tablet Commonly known as: VITAMIN C Take 1 tablet (500 mg total) by mouth 3 (three) times daily. What changed: when to take this   atorvastatin 40 MG tablet Commonly known as: LIPITOR TAKE 1 TABLET BY MOUTH EVERY DAY   carvedilol 3.125 MG tablet Commonly known as: COREG Take 1 tablet (3.125 mg total) by mouth 2 (two) times daily. NEEDS FOLLOW UP APPOINTMENT FOR MORE REFILLS   CENTRUM ADULTS PO Take 1 tablet  by mouth daily.   cholecalciferol 25 MCG (1000 UNIT) tablet Commonly known as: VITAMIN D3 Take 1 tablet (1,000 Units total) by mouth daily.   colchicine 0.6 MG tablet Take 0.6 mg by mouth 2 (two) times daily as needed.   [employer] Aspirin Adult Low Dose 81 MG chewable tablet Generic drug: aspirin CHEW 1 TABLET BY MOUTH DAILY.   cyanocobalamin 1000 MCG tablet Commonly known as: VITAMIN B12 Take 1 tablet (1,000 mcg total) by mouth daily.   dapagliflozin propanediol 10 MG Tabs tablet Commonly known as: FARXIGA Take 1 tablet (10 mg total) by mouth daily.   Entresto 24-26 MG Generic drug: sacubitril-valsartan Take 1 tablet by mouth 2 (two) times daily.   Omega 3 340 MG Cpdr Take 1 capsule (340 mg total) by mouth 2 (two) times daily. What changed: when to take this   omeprazole 40 MG capsule Commonly known as: PRILOSEC Take 1 capsule (40 mg total) by mouth daily.   sertraline 50 MG tablet Commonly known as: ZOLOFT Take 1 tablet (50 mg total) by mouth daily.   spironolactone 25 MG  tablet Commonly known as: ALDACTONE Take 0.5 tablets (12.5 mg total) by mouth daily. What changed: how much to take   torsemide 20 MG tablet Commonly known as: DEMADEX Take 2 tablets (40 mg total) by mouth daily. In case of weight gain 2 to 3 lbs in 24 hrs or 5 lbs in 7 days, take 3 tablets daily until weight back to baseline. What changed:  how much to take how to take this when to take this additional instructions        Discharge Exam: Filed Weights   04/01/23 0502 04/02/23 0431 04/03/23 0435  Weight: (!) 153.2 kg (!) 152.1 kg (!) 150 kg   BP 109/70 (BP Location: Left Arm)   Pulse 68   Temp (!) 97.5 F (36.4 C) (Oral)   Resp 18   Ht 5\' 11"  (1.803 m)   Wt (!) 150 kg   SpO2 92%   BMI 46.14 kg/m   Neurology awake and alert ENT with no pallor Cardiovascular with S1 and S2 present and regular with no gallops, rubs or murmurs No JVD Lower extremity with no edema Respiratory with  no rales or wheezing, no rhonchi Abdomen with no distention   Condition at discharge: stable  The results of significant diagnostics from this hospitalization (including imaging, microbiology, ancillary and laboratory) are listed below for reference.   Imaging Studies: ECHOCARDIOGRAM COMPLETE Result Date: 03/31/2023    ECHOCARDIOGRAM REPORT   Patient Name:   HAZEM KENNER Date of Exam: 03/31/2023 Medical Rec #:  [id number]    Height:       71.0 in Accession #:    [id number]   Weight:       346.6 lb Date of Birth:  [birth year]    BSA:          2.664 m Patient Age:    67 years     BP:           104/63 mmHg Patient Gender: M            HR:           69 bpm. Exam Location:  Inpatient Procedure: 2D Echo, Color Doppler, Cardiac Doppler and Intracardiac            Opacification Agent (Both Spectral and Color Flow Doppler were            utilized during procedure). Indications:    CHF Acute Systolic I50.21  History:        Patient has prior history of Echocardiogram examinations, most                 recent 01/04/2022. CHF, CAD, Pacemaker and Defibrillator; Risk                 Factors:Hypertension, Dyslipidemia and Diabetes.  Sonographer:    Harriette Bouillon RDCS Referring Phys: Alessandra Bevels  Sonographer Comments: Technically difficult study due to poor echo windows. IMPRESSIONS  1. Very technically difficult study with poor echo windows, despite Definity contrast  2. Left ventricular ejection fraction, by estimation, is 25 to 30%. Left ventricular ejection fraction by PLAX is 30 %. The left ventricle has severely decreased function. The left ventricle demonstrates global hypokinesis. There is moderate left ventricular hypertrophy. Left ventricular diastolic parameters are indeterminate.  3. Right ventricular systolic function was not well visualized. The right ventricular size is not well visualized.  4. The aortic valve is not well visualized. Short axis views suggest aortic sclerosis/calcification, but no  significant stenosis.  5. The mitral valve was not well visualized. No evidence of mitral valve regurgitation. Comparison(s): Changes from prior study are noted. 01/04/2022: LVEF 30%. FINDINGS  Left Ventricle: Left ventricular ejection fraction, by estimation, is 25 to 30%. Left ventricular ejection fraction by PLAX is 30 %. The left ventricle has severely decreased function. The left ventricle demonstrates global hypokinesis. Definity contrast agent was given IV to delineate the left ventricular endocardial borders. The left ventricular internal cavity size was normal in size. There is moderate left ventricular hypertrophy. Left ventricular diastolic parameters are indeterminate. Right Ventricle: The right ventricular size is not well visualized. Right vetricular wall thickness was not well visualized. Right ventricular systolic function was not well visualized. Left Atrium: Left atrial size was normal in size. Right Atrium: Right atrial size was normal in size. Pericardium: There is no evidence of pericardial effusion. Mitral Valve: The mitral valve was not well visualized. No evidence of mitral valve regurgitation. Tricuspid Valve: The tricuspid valve is grossly normal. Tricuspid valve regurgitation is trivial. Aortic Valve: The aortic valve was not well visualized. Aortic valve regurgitation is not visualized. Aortic valve sclerosis/calcification is present, without any evidence of aortic stenosis. Pulmonic Valve: The pulmonic valve was not assessed. Pulmonic valve regurgitation is not visualized. Aorta: The aortic root and ascending aorta are structurally normal, with no evidence of dilitation. IAS/Shunts: No atrial level shunt detected by color flow Doppler. Additional Comments: A device lead is visualized.  LEFT VENTRICLE PLAX 2D LV EF:         Left            Diastology                ventricular     LV e' lateral: 7.18 cm/s                ejection                fraction by                PLAX is 30                 %. LVIDd:         5.60 cm LVIDs:         4.80 cm LV PW:         1.40 cm LV IVS:        1.30 cm LVOT diam:     2.40 cm LV SV:         51 LV SV Index:   19 LVOT Area:     4.52 cm  IVC IVC diam: 2.70 cm LEFT ATRIUM         Index LA diam:    5.20 cm 1.95 cm/m  AORTIC VALVE LVOT VTI:    0.112 m  AORTA Ao Root diam: 3.40 cm Ao Asc diam:  3.30 cm  SHUNTS Systemic VTI:  0.11 m Systemic Diam: 2.40 cm Zoila Shutter MD Electronically signed by Zoila Shutter MD Signature Date/Time: 03/31/2023/1:35:51 PM    Final    DG Chest 2 View Result Date: 03/30/2023 CLINICAL DATA:  cough, dyspnea. EXAM: CHEST - 2 VIEW COMPARISON:  09/02/2021. FINDINGS: Low lung volume. Mild diffuse interstitial prominence. It is uncertain whether this represents artifactual "crowding" of the interstitium related to suboptimal inspiration versus increased interstitial markings, which can be due to pulmonary edema, atypical pneumonia, etc. Bilateral lung fields are otherwise clear. Bilateral costophrenic angles are clear. Stable cardio-mediastinal silhouette. There is a  left sided 2-lead pacemaker. No acute osseous abnormalities. The soft tissues are within normal limits. IMPRESSION: Mild diffuse interstitial prominence, as discussed above. Otherwise no acute cardiopulmonary abnormality. Electronically Signed   By: Jules Schick M.D.   On: 03/30/2023 10:39   CUP PACEART REMOTE DEVICE CHECK Result Date: 03/21/2023 Scheduled remote reviewed. Normal device function.  HF diagnostics currently abnormal Next remote 91 days. LA, CVRS   Microbiology: Results for orders placed or performed during the hospital encounter of 03/30/23  Resp panel by RT-PCR (RSV, Flu A&B, Covid) Anterior Nasal Swab     Status: None   Collection Time: 03/30/23  9:47 AM   Specimen: Anterior Nasal Swab  Result Value Ref Range Status   SARS Coronavirus 2 by RT PCR NEGATIVE NEGATIVE Final   Influenza A by PCR NEGATIVE NEGATIVE Final   Influenza B by PCR NEGATIVE  NEGATIVE Final    Comment: (NOTE) The Xpert Xpress SARS-CoV-2/FLU/RSV plus assay is intended as an aid in the diagnosis of influenza from Nasopharyngeal swab specimens and should not be used as a sole basis for treatment. Nasal washings and aspirates are unacceptable for Xpert Xpress SARS-CoV-2/FLU/RSV testing.  Fact Sheet for Patients: BloggerCourse.com  Fact Sheet for Healthcare Providers: SeriousBroker.it  This test is not yet approved or cleared by the [place] FDA and has been authorized for detection and/or diagnosis of SARS-CoV-2 by FDA under an Emergency Use Authorization (EUA). This EUA will remain in effect (meaning this test can be used) for the duration of the COVID-19 declaration under Section 564(b)(1) of the Act, 21 U.S.C. section 360bbb-3(b)(1), unless the authorization is terminated or revoked.     Resp Syncytial Virus by PCR NEGATIVE NEGATIVE Final    Comment: (NOTE) Fact Sheet for Patients: BloggerCourse.com  Fact Sheet for Healthcare Providers: SeriousBroker.it  This test is not yet approved or cleared by the [place] FDA and has been authorized for detection and/or diagnosis of SARS-CoV-2 by FDA under an Emergency Use Authorization (EUA). This EUA will remain in effect (meaning this test can be used) for the duration of the COVID-19 declaration under Section 564(b)(1) of the Act, 21 U.S.C. section 360bbb-3(b)(1), unless the authorization is terminated or revoked.  Performed at [facility], [name], [place], [place] [address]   Culture, blood (routine x 2)     Status: None (Preliminary result)   Collection Time: 03/30/23 12:15 PM   Specimen: BLOOD  Result Value Ref Range Status   Specimen Description BLOOD RIGHT ANTECUBITAL  Final   Special Requests   Final    BOTTLES DRAWN AEROBIC AND ANAEROBIC Blood Culture adequate volume    Culture   Final    NO GROWTH 3 DAYS Performed at [facility], [address] [place], [place] [address]    Report Status PENDING  Incomplete  Culture, blood (Routine X 2) w Reflex to ID Panel     Status: None (Preliminary result)   Collection Time: 03/30/23  8:03 PM   Specimen: BLOOD  Result Value Ref Range Status   Specimen Description BLOOD LEFT ANTECUBITAL  Final   Special Requests   Final    BOTTLES DRAWN AEROBIC AND ANAEROBIC Blood Culture adequate volume   Culture   Final    NO GROWTH 3 DAYS Performed at [facility], [address] [place], [place] [address]    Report Status PENDING  Incomplete   *Note: Due to a large number of results and/or encounters for the requested  time period, some results have not been displayed. A complete set of results can be found in Results Review.    Labs: CBC: Recent Labs  Lab 03/30/23 0947  WBC 10.5  NEUTROABS 9.1*  HGB 14.7  HCT 44.6  MCV 103.7*  PLT 142*   Basic Metabolic Panel: Recent Labs  Lab 03/30/23 0947 03/31/23 0308 04/01/23 0251 04/02/23 0247 04/03/23 0231  NA 134* 138 133* 135 135  K 3.9 3.8 3.7 4.1 4.1  CL 99 99 94* 95* 97*  CO2 24 24 28 31 27   GLUCOSE 137* 104* 115* 159* 125*  BUN 18 20 17  25* 30*  CREATININE 1.18 1.21 1.14 1.20 1.11  CALCIUM 8.6* 8.7* 8.6* 9.4 9.5  MG  --   --  1.8 2.2 2.4   Liver Function Tests: Recent Labs  Lab 03/30/23 0947  AST 23  ALT 14  ALKPHOS 104  BILITOT 1.3*  PROT 6.8  ALBUMIN 3.3*   CBG: Recent Labs  Lab 03/30/23 1812 03/31/23 0116 03/31/23 0619 03/31/23 1048  GLUCAP 96 90 113* 122*    Discharge time spent: greater than 30 minutes.  Signed: Coralie Keens, MD [facility] 04/03/2023

## 2023-04-03 NOTE — Care Management Important Message (Signed)
 Important Message  Patient Details  Name: [name] MRN: [id number] Date of Birth: Jul 07, 1955   Important Message Given:  Yes - Medicare IM     Renie Ora 04/03/2023, 10:17 AM

## 2023-04-04 LAB — CULTURE, BLOOD (ROUTINE X 2)
Culture: NO GROWTH
Culture: NO GROWTH
Special Requests: ADEQUATE
Special Requests: ADEQUATE

## 2023-04-05 ENCOUNTER — Encounter (HOSPITAL_BASED_OUTPATIENT_CLINIC_OR_DEPARTMENT_OTHER): Payer: Medicare Other | Admitting: Internal Medicine

## 2023-04-05 DIAGNOSIS — S91001D Unspecified open wound, right ankle, subsequent encounter: Secondary | ICD-10-CM | POA: Diagnosis not present

## 2023-04-05 DIAGNOSIS — L97518 Non-pressure chronic ulcer of other part of right foot with other specified severity: Secondary | ICD-10-CM

## 2023-04-05 DIAGNOSIS — Z89422 Acquired absence of other left toe(s): Secondary | ICD-10-CM | POA: Diagnosis not present

## 2023-04-05 DIAGNOSIS — E11621 Type 2 diabetes mellitus with foot ulcer: Secondary | ICD-10-CM

## 2023-04-05 DIAGNOSIS — I87311 Chronic venous hypertension (idiopathic) with ulcer of right lower extremity: Secondary | ICD-10-CM

## 2023-04-05 DIAGNOSIS — S91104D Unspecified open wound of right lesser toe(s) without damage to nail, subsequent encounter: Secondary | ICD-10-CM | POA: Diagnosis not present

## 2023-04-07 ENCOUNTER — Telehealth (HOSPITAL_COMMUNITY): Payer: Self-pay

## 2023-04-07 NOTE — Telephone Encounter (Signed)
 Called to confirm/remind patient of their appointment at [facility] on 04/10/2023 3:00.   Appointment:   [x] Confirmed  [] Left mess   [] No answer/No voice mail  [] Phone not in service  Patient reminded to bring all medications and/or complete list.  Confirmed patient has transportation. Gave directions, instructed to utilize valet parking.

## 2023-04-10 ENCOUNTER — Encounter (HOSPITAL_COMMUNITY): Payer: Self-pay

## 2023-04-10 ENCOUNTER — Ambulatory Visit (HOSPITAL_COMMUNITY)
Admit: 2023-04-10 | Discharge: 2023-04-10 | Disposition: A | Discharge status: Home / Self Care | Attending: Adult Health | Admitting: Adult Health

## 2023-04-10 VITALS — BP 122/72 | HR 73 | Ht 71.0 in | Wt 331.6 lb

## 2023-04-10 DIAGNOSIS — Z7984 Long term (current) use of oral hypoglycemic drugs: Secondary | ICD-10-CM | POA: Insufficient documentation

## 2023-04-10 DIAGNOSIS — E11621 Type 2 diabetes mellitus with foot ulcer: Secondary | ICD-10-CM

## 2023-04-10 DIAGNOSIS — I482 Chronic atrial fibrillation, unspecified: Secondary | ICD-10-CM | POA: Diagnosis not present

## 2023-04-10 DIAGNOSIS — I110 Hypertensive heart disease with heart failure: Secondary | ICD-10-CM | POA: Diagnosis not present

## 2023-04-10 DIAGNOSIS — I428 Other cardiomyopathies: Secondary | ICD-10-CM | POA: Diagnosis not present

## 2023-04-10 DIAGNOSIS — E66813 Obesity, class 3: Secondary | ICD-10-CM

## 2023-04-10 DIAGNOSIS — Z6841 Body Mass Index (BMI) 40.0 and over, adult: Secondary | ICD-10-CM | POA: Insufficient documentation

## 2023-04-10 DIAGNOSIS — Z7901 Long term (current) use of anticoagulants: Secondary | ICD-10-CM | POA: Insufficient documentation

## 2023-04-10 DIAGNOSIS — I5082 Biventricular heart failure: Secondary | ICD-10-CM | POA: Insufficient documentation

## 2023-04-10 DIAGNOSIS — G4733 Obstructive sleep apnea (adult) (pediatric): Secondary | ICD-10-CM | POA: Diagnosis not present

## 2023-04-10 DIAGNOSIS — R001 Bradycardia, unspecified: Secondary | ICD-10-CM | POA: Insufficient documentation

## 2023-04-10 DIAGNOSIS — Z87891 Personal history of nicotine dependence: Secondary | ICD-10-CM | POA: Insufficient documentation

## 2023-04-10 DIAGNOSIS — E119 Type 2 diabetes mellitus without complications: Secondary | ICD-10-CM | POA: Insufficient documentation

## 2023-04-10 DIAGNOSIS — I5022 Chronic systolic (congestive) heart failure: Secondary | ICD-10-CM | POA: Insufficient documentation

## 2023-04-10 DIAGNOSIS — I739 Peripheral vascular disease, unspecified: Secondary | ICD-10-CM | POA: Diagnosis not present

## 2023-04-10 DIAGNOSIS — I2510 Atherosclerotic heart disease of native coronary artery without angina pectoris: Secondary | ICD-10-CM | POA: Insufficient documentation

## 2023-04-10 DIAGNOSIS — L97509 Non-pressure chronic ulcer of other part of unspecified foot with unspecified severity: Secondary | ICD-10-CM

## 2023-04-10 DIAGNOSIS — I480 Paroxysmal atrial fibrillation: Secondary | ICD-10-CM | POA: Diagnosis not present

## 2023-04-10 DIAGNOSIS — E785 Hyperlipidemia, unspecified: Secondary | ICD-10-CM | POA: Insufficient documentation

## 2023-04-10 DIAGNOSIS — J849 Interstitial pulmonary disease, unspecified: Secondary | ICD-10-CM | POA: Diagnosis not present

## 2023-04-10 DIAGNOSIS — I4430 Unspecified atrioventricular block: Secondary | ICD-10-CM | POA: Insufficient documentation

## 2023-04-10 DIAGNOSIS — Z8572 Personal history of non-Hodgkin lymphomas: Secondary | ICD-10-CM | POA: Insufficient documentation

## 2023-04-10 DIAGNOSIS — Z79899 Other long term (current) drug therapy: Secondary | ICD-10-CM | POA: Insufficient documentation

## 2023-04-10 LAB — BASIC METABOLIC PANEL WITH GFR
Anion gap: 10 (ref 5–15)
BUN: 33 mg/dL — ABNORMAL HIGH (ref 8–23)
CO2: 25 mmol/L (ref 22–32)
Calcium: 9.3 mg/dL (ref 8.9–10.3)
Chloride: 103 mmol/L (ref 98–111)
Creatinine, Ser: 1.11 mg/dL (ref 0.61–1.24)
GFR, Estimated: >60 mL/min (ref >60)
Glucose, Bld: 101 mg/dL — ABNORMAL HIGH (ref 70–99)
Potassium: 4.7 mmol/L (ref 3.5–5.1)
Sodium: 138 mmol/L (ref 135–145)

## 2023-04-10 LAB — BRAIN NATRIURETIC PEPTIDE: B Natriuretic Peptide: 97.4 pg/mL (ref 0.0–100.0)

## 2023-04-10 NOTE — Progress Notes (Signed)
 -  ADVANCED HF CLINIC NOTE   PCP: [name], MD HF Cardiologist: Dr. Gala Romney  Reason for Visit: Heart Failure Follow-up HPI: [name] is a 68 y.o.y/o male w/ h/o chronic systolic heart failure, chronic afib, OSA, HTN, HLD, type 2DM, OSA on CPAP, ILD, tobacco use and venous stasis ulcers, followed by wound clinic. Also history of chemotherapy with potential cardiotoxic effects (R-CHOP for lymphoma in 2011).    Admitted in 02/2017 for acute hypoxic respiratory failure requiring intubation and ultimately tracheostomy. This was in the setting of multifocal PNA/ influenza and new onset atrial fibrillation w/ RVR. Echo showed mod LVH and severely reduced LVEF 25-30% w/ diffuse HK. RV normal. At the time, his CM was felt to be viral. Did not get cath. Per d/c summary, DCCV not pursued. Afib treated w/ rate control and Eliquis.    Repeat echo 07/2017 EF improved to 50-55%, RV mildly dilated w/ mildly reduced systolic function. He was noted to be in afib at the time of study.    08/2017, wore Zio that showed Afib, well rated controlled.    Echo 11/2018 EF 45-50%, RV mildly reduced    Unfortunately, lost to f/u by cardiology for a period of time. Had been seeing [facility] for PAD, s/p left transmetatarsal amputation.  Echo 8/23 showed EF 30-35%, severe LVH, moderately reduced RV systolic function, moderate TVR.    Admitted 8/23 with pre-syncope, ECG showed AF w/ SVR, HR 28 BPM, RBBB, had several 2 sec pauses. Beta blocker and other GDMT held with AKI and hypotension. He developed worsening periods of high-grade AV block/atrial fibrillation with slow ventricular response and lengthy pauses w/ hypotension. Underwent TVP with improvement in symptoms. R/LHC showed diffuse nonobstructive CAD, bi-V failure R>L, mild to moderate pulmonary venous hypertension. EP consulted and he underwent BiV ICD. Hospitalization complicated by chronic venous stasis wounds. Able to transition to GDMT as AKI resolved.  Discharged home with [facility] PT[facility]OT, weight 328 lbs.  Echo 01/04/22: EF 30% RV moderately down   Follow up 04/11/22, massive volume overload, with NYHA III symptoms. Only taking torsemide on weekends. Restarted torsemide 40 bid x 3 days, then down to 40 mg daily.  Today he returns for AHF follow up with his friend. Overall feeling ok. Denies palpitations, CP, dizziness, edema, or PND/Orthopnea. SOB with activity. Appetite ok. No fever or chills. Weight at home 340-343 pounds. Taking all medications. Denies ETOH, tobacco or drug use. Wears CPAP. Works as a Producer, television/film/video.   Cardiac Studies - Echo (12/23): EF 30%, RV moderately down - PYP (10/23): equivocal grade 1, H/CL = 1.06 - Echo (8/23): EF 30-35%, moderately decreased LV function, RV moderate reduced, moderate TR - R/LHC (8/23): Mid RCA lesion is 40% stenosed. RPDA lesion is 95% stenosed. Ost LM lesion is 30% stenosed. Mid LAD lesion is 30% stenosed.  2nd Diag lesion is 50% stenosed.  Mid Cx lesion is 30% stenosed. Ao = 109/58 (78), LV = 119/19, RA =  21, RV = 57/19, PA = 56/22 (36), PCW = 24 (v = 30), Fick cardiac output/index = 7.3/2.7, PVR = 1.8 WU, Ao sat = 99%, PA sat = 68%, 69%, PAPi = 2. 1. Mostly non-obstructive CAD with high-grade lesion in mid to distal RPDA 2. Nonischemic CM 3. Mild to moderate pulmonary venous HTN with evidence of RV dysfunction. Continue medical therapy. If patient develops angina can consider PCI of PDA; otherwise medical therapy.   Past Medical History:  Diagnosis Date   Acute systolic  HF (heart failure) (HCC)    Arthritis    lt foot, hips knees    Atrial fibrillation (HCC)    Diabetes mellitus, new onset (HCC)    Diverticulosis    Dyslipidemia    Gout attack 01/16/2012   Hypertension    Internal hemorrhoids    Lymphoma (HCC)    remission for about 2 years, chemo 3 years prior   Pneumonia due to COVID-19 virus 07/2018   Pulmonary hypertension (HCC)    Sepsis (HCC) 02/2017   secondary to influenza; requiring  trach   Tubular adenoma of colon    Venous stasis ulcers (HCC)     Current Outpatient Medications  Medication Sig Dispense Refill   acetaminophen (TYLENOL) 325 MG tablet Take 650 mg by mouth every 6 (six) hours as needed for moderate pain.      apixaban (ELIQUIS) 5 MG TABS tablet Take 1 tablet (5 mg total) by mouth 2 (two) times daily. NEEDS FOLLOW UP APPOINTMENT 180 tablet 1   ascorbic acid (VITAMIN C) 500 MG tablet Take 1 tablet (500 mg total) by mouth 3 (three) times daily. 30 tablet 0   atorvastatin (LIPITOR) 40 MG tablet TAKE 1 TABLET BY MOUTH EVERY DAY 30 tablet 11   carvedilol (COREG) 3.125 MG tablet Take 1 tablet (3.125 mg total) by mouth 2 (two) times daily. NEEDS FOLLOW UP APPOINTMENT FOR MORE REFILLS 180 tablet 2   cholecalciferol (VITAMIN D) 25 MCG (1000 UNIT) tablet Take 1 tablet (1,000 Units total) by mouth daily. 30 tablet 0   colchicine 0.6 MG tablet Take 0.6 mg by mouth 2 (two) times daily as needed.     [facility] ASPIRIN ADULT LOW DOSE 81 MG chewable tablet CHEW 1 TABLET BY MOUTH DAILY. 108 tablet 3   dapagliflozin propanediol (FARXIGA) 10 MG TABS tablet Take 1 tablet (10 mg total) by mouth daily. 90 tablet 1   Multiple Vitamins-Minerals (CENTRUM ADULTS PO) Take 1 tablet by mouth daily.     Omega 3 340 MG CPDR Take 1 capsule (340 mg total) by mouth 2 (two) times daily. 60 capsule 0   omeprazole (PRILOSEC) 40 MG capsule Take 1 capsule (40 mg total) by mouth daily. 30 capsule 0   sacubitril-valsartan (ENTRESTO) 24-26 MG Take 1 tablet by mouth 2 (two) times daily. 60 tablet 3   sertraline (ZOLOFT) 50 MG tablet Take 1 tablet (50 mg total) by mouth daily. 30 tablet 0   spironolactone (ALDACTONE) 25 MG tablet Take 12.5 mg by mouth daily.     torsemide (DEMADEX) 20 MG tablet Take 2 tablets (40 mg total) by mouth daily. In case of weight gain 2 to 3 lbs in 24 hrs or 5 lbs in 7 days, take 3 tablets daily until weight back to baseline. 80 tablet 0   vitamin B-12 (CYANOCOBALAMIN) 1000 MCG  tablet Take 1 tablet (1,000 mcg total) by mouth daily. 30 tablet 0   No current facility-administered medications for this encounter.   Allergies  Allergen Reactions   Latex Hives and Swelling   Ace Inhibitors Other (See Comments)    Other reaction(s): cough   Penicillin G Hives and Rash    Has tolerated amoxicillin   Social History   Socioeconomic History   Marital status: Divorced    Spouse name: Not on file   Number of children: Not on file   Years of education: Not on file   Highest education level: Not on file  Occupational History   Occupation: cosmetologist  Tobacco Use  Smoking status: Former    Current packs/day: 0.00    Average packs/day: 0.5 packs/day for 20.0 years (10.0 ttl pk-yrs)    Types: Cigarettes    Start date: 01/10/1989    Quit date: 01/10/2009    Years since quitting: 14.2   Smokeless tobacco: Never  Vaping Use   Vaping status: Never Used  Substance and Sexual Activity   Alcohol use: Not Currently    Comment: 2-3 times per week   Drug use: No   Sexual activity: Not on file  Other Topics Concern   Not on file  Social History Narrative   Not on file   Social Drivers of Health   Financial Resource Strain: Not on file  Food Insecurity: No Food Insecurity (03/30/2023)   Hunger Vital Sign    Worried About Running Out of Food in the Last Year: Never true    Ran Out of Food in the Last Year: Never true  Transportation Needs: No Transportation Needs (03/30/2023)   PRAPARE - Administrator, Civil Service (Medical): No    Lack of Transportation (Non-Medical): No  Physical Activity: Not on file  Stress: Not on file  Social Connections: Moderately Isolated (03/30/2023)   Social Connection and Isolation Panel [NHANES]    Frequency of Communication with Friends and Family: More than three times a week    Frequency of Social Gatherings with Friends and Family: More than three times a week    Attends Religious Services: More than 4 times per year     Active Member of [employer] or Organizations: No    Attends Banker Meetings: Never    Marital Status: Divorced  Catering manager Violence: Not At Risk (03/30/2023)   Humiliation, Afraid, Rape, and Kick questionnaire    Fear of Current or Ex-Partner: No    Emotionally Abused: No    Physically Abused: No    Sexually Abused: No   Family History  Problem Relation Age of Onset   Dementia Brother 41       frontal lobe    Dementia Mother    Arthritis Mother    Hypertension Mother    CVA Sister 66   Colon cancer Neg Hx    Pancreatic cancer Neg Hx    Stomach cancer Neg Hx    Rectal cancer Neg Hx    Liver cancer Neg Hx    BP 122/72   Pulse 73   Ht 5\' 11"  (1.803 m)   Wt (!) 150.4 kg (331 lb 9.6 oz)   SpO2 93%   BMI 46.25 kg/m   Wt Readings from Last 3 Encounters:  04/10/23 (!) 150.4 kg (331 lb 9.6 oz)  04/03/23 (!) 150 kg (330 lb 12.8 oz)  01/30/23 (!) 156.6 kg (345 lb 3.2 oz)   PHYSICAL EXAM: General: Well appearing. No distress on RA. Walked into clinic w cane Cardiac: JVP difficult to assess. S1 and S2 present. No murmurs or rub. Resp: Lung sounds clear and equal B/L, bases diminished. Abdomen: Obese, soft, non-tender, non-distended.  Extremities: Warm and dry.  No peripheral edema.  Neuro: Alert and oriented x3. Affect pleasant. Moves all extremities without difficulty.  MDT device interrogation (personally reviewed): Optiol down, No VT/AF, 98% VP, 0.6 h/day activity (likely d/t hospitalization)  ReDs reading: 26 %, normal  ECG (personally reviewed): V-paced 76 bpm, QRS 170 ms  ASSESSMENT & PLAN: Chronic Systolic Biventricular Heart Failure - Echo (2/19): EF 25-30% w/ diffuse HK. RV normal. Felt to be viral  CM +/- component of tachymediated in setting of influenza PNA and rapid Afib  - Echo (7/19): EF recovered, 50-55%, RV mildly reduced - Echo (8/23): EF 30-35%, severe LVH, moderately reduced RV systolic function, moderate TR - R/LHC (8/23): diffuse  non-obstructive CAD with 90% lesion in dLAD, BiV failure R>L: RA 21 PA 56/22 (36) PCW 24 Fick 7.3/2.7 PaPi 2.1 - Myeloma panel and urine immunofixation negative.  - PYP scan 10/23 equivocal. Resent Genetic testing. - Echo (3/25): EF 25-30%, mod LVH - s/p MDT CRT-D  - Stable NYHA II-early III, confounded by body habitus and LE wounds. - Volume euvolemic by exam, ReDs, and OptiVol stable - Continue torsemide 40 mg daily. Has KCL at home, will call this instruction for dosing once BMET results.  - Continue spironolactone 12.5 mg daily. - Continue Farxiga 10 mg daily.  - Continue carvedilol 3.125 mg bid  - Continue Entresto 24/26 mg bid   2. Profound Bradycardia w/ High-grade AV block - s/p BiV ICD. - Interrogation as above.   3. Chronic Afib - Dates back to at least 2019, has been historically well rate controlled. - Continue CPAP.  - Continue Eliquis. No bleeding issues.   4. PAD/ Chronic Venous Stasis Wounds  - s/p left transmetatarsal amputation - Followed by [facility] and Wound clinic  5. H/o Lymphoma - s/p R-CHOP in 2011 (potential cardiotoxic effects)    6. Type 2DM - Hgb A1c 6.0 - On SGLT2i    7. CAD - LHC (8/23) diffuse mostly non-obstructive CAD, not cause of CM. - No s/s angina - If has angina, can consider PCI PDA - Continue medical management with statin. No ASA with AC.    8. Morbid obesity - Body mass index is 46.25 kg/m. - Needs weight loss. - insurance did not cover Mounjaro. Did not tolerate Ozempic - will need to readdress getting back on Mounjaro at next visit.    9. ILD - Hi rest chest CT nonspecific, but ? NSIP vs chronic hypersensitivity pneumonitis - Followed by Pulmonary.   Follow up in 3 months with Dr. Gala Romney.   [place] Rajveer Handler, NP  3:11 PM

## 2023-04-10 NOTE — Progress Notes (Signed)
 ReDS Vest / Clip - 04/10/23 1505       ReDS Vest / Clip   Station Marker D    Ruler Value 42    ReDS Value Range Low volume    ReDS Actual Value 26

## 2023-04-10 NOTE — Patient Instructions (Addendum)
 Thank you for coming in today  If you had labs drawn today, any labs that are abnormal the clinic will call you No news is good news  Medications: No changes  Follow up appointments:  Your physician recommends that you schedule a follow-up appointment in:  1 month in clinic  3 months With Dr. Gala Romney    Do the following things EVERYDAY: Weigh yourself in the morning before breakfast. Write it down and keep it in a log. Take your medicines as prescribed Eat low salt foods--Limit salt (sodium) to 2000 mg per day.  Stay as active as you can everyday Limit all fluids for the day to less than 2 liters   At the [facility], you and your health needs are our priority. As part of our continuing mission to provide you with exceptional heart care, we have created designated Provider Care Teams. These Care Teams include your primary Cardiologist (physician) and Advanced Practice Providers (APPs- Physician Assistants and Nurse Practitioners) who all work together to provide you with the care you need, when you need it.   You may see any of the following providers on your designated Care Team at your next follow up: Dr Arvilla Meres Dr Marca Ancona Dr. Marcos Eke, NP Robbie Lis, [place] [facility] [place], [place] Brynda Peon, NP Karle Plumber, PharmD   Please be sure to bring in all your medications bottles to every appointment.    Thank you for choosing [facility]  If you have any questions or concerns before your next appointment please send [place] a message through [place] or call our office at [phone].    TO LEAVE A MESSAGE FOR THE NURSE SELECT OPTION 2, PLEASE LEAVE A MESSAGE INCLUDING: YOUR NAME DATE OF BIRTH CALL BACK NUMBER REASON FOR CALL**this is important as we prioritize the call backs  YOU WILL RECEIVE A CALL BACK THE SAME DAY AS LONG AS YOU CALL BEFORE 4:00 PM

## 2023-04-12 ENCOUNTER — Encounter (HOSPITAL_COMMUNITY): Payer: Self-pay

## 2023-04-12 ENCOUNTER — Encounter (HOSPITAL_BASED_OUTPATIENT_CLINIC_OR_DEPARTMENT_OTHER): Attending: Internal Medicine | Admitting: Internal Medicine

## 2023-04-12 DIAGNOSIS — S91104D Unspecified open wound of right lesser toe(s) without damage to nail, subsequent encounter: Secondary | ICD-10-CM | POA: Diagnosis not present

## 2023-04-12 DIAGNOSIS — I890 Lymphedema, not elsewhere classified: Secondary | ICD-10-CM | POA: Insufficient documentation

## 2023-04-12 DIAGNOSIS — S91001D Unspecified open wound, right ankle, subsequent encounter: Secondary | ICD-10-CM

## 2023-04-12 DIAGNOSIS — L97518 Non-pressure chronic ulcer of other part of right foot with other specified severity: Secondary | ICD-10-CM | POA: Insufficient documentation

## 2023-04-12 DIAGNOSIS — S90811A Abrasion, right foot, initial encounter: Secondary | ICD-10-CM | POA: Insufficient documentation

## 2023-04-12 DIAGNOSIS — I87311 Chronic venous hypertension (idiopathic) with ulcer of right lower extremity: Secondary | ICD-10-CM | POA: Diagnosis not present

## 2023-04-12 DIAGNOSIS — X58XXXA Exposure to other specified factors, initial encounter: Secondary | ICD-10-CM | POA: Insufficient documentation

## 2023-04-12 DIAGNOSIS — E11621 Type 2 diabetes mellitus with foot ulcer: Secondary | ICD-10-CM | POA: Diagnosis not present

## 2023-04-16 DIAGNOSIS — G4733 Obstructive sleep apnea (adult) (pediatric): Secondary | ICD-10-CM | POA: Diagnosis not present

## 2023-04-19 ENCOUNTER — Encounter (HOSPITAL_BASED_OUTPATIENT_CLINIC_OR_DEPARTMENT_OTHER): Admitting: Internal Medicine

## 2023-04-19 DIAGNOSIS — S90811A Abrasion, right foot, initial encounter: Secondary | ICD-10-CM | POA: Diagnosis not present

## 2023-04-19 DIAGNOSIS — X58XXXA Exposure to other specified factors, initial encounter: Secondary | ICD-10-CM | POA: Diagnosis not present

## 2023-04-19 DIAGNOSIS — E11621 Type 2 diabetes mellitus with foot ulcer: Secondary | ICD-10-CM | POA: Diagnosis not present

## 2023-04-19 DIAGNOSIS — L97518 Non-pressure chronic ulcer of other part of right foot with other specified severity: Secondary | ICD-10-CM

## 2023-04-19 DIAGNOSIS — I890 Lymphedema, not elsewhere classified: Secondary | ICD-10-CM | POA: Diagnosis not present

## 2023-04-19 DIAGNOSIS — I87311 Chronic venous hypertension (idiopathic) with ulcer of right lower extremity: Secondary | ICD-10-CM | POA: Diagnosis not present

## 2023-04-24 NOTE — Addendum Note (Signed)
 Addended by: Edra Govern D on: 04/24/2023 09:38 AM   Modules accepted: Orders

## 2023-04-24 NOTE — Progress Notes (Signed)
 Remote ICD transmission.

## 2023-04-26 ENCOUNTER — Encounter (HOSPITAL_BASED_OUTPATIENT_CLINIC_OR_DEPARTMENT_OTHER): Admitting: Internal Medicine

## 2023-04-26 ENCOUNTER — Encounter (HOSPITAL_BASED_OUTPATIENT_CLINIC_OR_DEPARTMENT_OTHER): Payer: Self-pay

## 2023-05-01 ENCOUNTER — Encounter (HOSPITAL_BASED_OUTPATIENT_CLINIC_OR_DEPARTMENT_OTHER): Admitting: Internal Medicine

## 2023-05-01 ENCOUNTER — Encounter (HOSPITAL_COMMUNITY): Payer: Medicare Other

## 2023-05-01 DIAGNOSIS — I890 Lymphedema, not elsewhere classified: Secondary | ICD-10-CM | POA: Diagnosis not present

## 2023-05-01 DIAGNOSIS — S91001D Unspecified open wound, right ankle, subsequent encounter: Secondary | ICD-10-CM | POA: Diagnosis not present

## 2023-05-01 DIAGNOSIS — S91104D Unspecified open wound of right lesser toe(s) without damage to nail, subsequent encounter: Secondary | ICD-10-CM | POA: Diagnosis not present

## 2023-05-01 DIAGNOSIS — X58XXXA Exposure to other specified factors, initial encounter: Secondary | ICD-10-CM | POA: Diagnosis not present

## 2023-05-01 DIAGNOSIS — E11621 Type 2 diabetes mellitus with foot ulcer: Secondary | ICD-10-CM

## 2023-05-01 DIAGNOSIS — I87311 Chronic venous hypertension (idiopathic) with ulcer of right lower extremity: Secondary | ICD-10-CM

## 2023-05-01 DIAGNOSIS — L97518 Non-pressure chronic ulcer of other part of right foot with other specified severity: Secondary | ICD-10-CM

## 2023-05-01 DIAGNOSIS — S90811A Abrasion, right foot, initial encounter: Secondary | ICD-10-CM

## 2023-05-03 ENCOUNTER — Ambulatory Visit (HOSPITAL_BASED_OUTPATIENT_CLINIC_OR_DEPARTMENT_OTHER): Admitting: Internal Medicine

## 2023-05-06 DIAGNOSIS — R55 Syncope and collapse: Secondary | ICD-10-CM | POA: Diagnosis not present

## 2023-05-06 DIAGNOSIS — W19XXXA Unspecified fall, initial encounter: Secondary | ICD-10-CM | POA: Diagnosis not present

## 2023-05-06 DIAGNOSIS — R58 Hemorrhage, not elsewhere classified: Secondary | ICD-10-CM | POA: Diagnosis not present

## 2023-05-06 DIAGNOSIS — S0990XA Unspecified injury of head, initial encounter: Secondary | ICD-10-CM | POA: Diagnosis not present

## 2023-05-08 DIAGNOSIS — E119 Type 2 diabetes mellitus without complications: Secondary | ICD-10-CM | POA: Diagnosis not present

## 2023-05-10 ENCOUNTER — Encounter (HOSPITAL_BASED_OUTPATIENT_CLINIC_OR_DEPARTMENT_OTHER): Admitting: Internal Medicine

## 2023-05-10 DIAGNOSIS — S90811A Abrasion, right foot, initial encounter: Secondary | ICD-10-CM

## 2023-05-10 DIAGNOSIS — I87311 Chronic venous hypertension (idiopathic) with ulcer of right lower extremity: Secondary | ICD-10-CM

## 2023-05-10 DIAGNOSIS — I890 Lymphedema, not elsewhere classified: Secondary | ICD-10-CM | POA: Diagnosis not present

## 2023-05-10 DIAGNOSIS — E11621 Type 2 diabetes mellitus with foot ulcer: Secondary | ICD-10-CM | POA: Diagnosis not present

## 2023-05-10 DIAGNOSIS — L97518 Non-pressure chronic ulcer of other part of right foot with other specified severity: Secondary | ICD-10-CM

## 2023-05-10 DIAGNOSIS — X58XXXA Exposure to other specified factors, initial encounter: Secondary | ICD-10-CM | POA: Diagnosis not present

## 2023-05-17 ENCOUNTER — Encounter (HOSPITAL_BASED_OUTPATIENT_CLINIC_OR_DEPARTMENT_OTHER): Attending: Internal Medicine | Admitting: Internal Medicine

## 2023-05-17 DIAGNOSIS — E11621 Type 2 diabetes mellitus with foot ulcer: Secondary | ICD-10-CM

## 2023-05-17 DIAGNOSIS — I890 Lymphedema, not elsewhere classified: Secondary | ICD-10-CM | POA: Diagnosis not present

## 2023-05-17 DIAGNOSIS — X58XXXA Exposure to other specified factors, initial encounter: Secondary | ICD-10-CM | POA: Insufficient documentation

## 2023-05-17 DIAGNOSIS — S90811A Abrasion, right foot, initial encounter: Secondary | ICD-10-CM

## 2023-05-17 DIAGNOSIS — I87311 Chronic venous hypertension (idiopathic) with ulcer of right lower extremity: Secondary | ICD-10-CM | POA: Diagnosis not present

## 2023-05-17 DIAGNOSIS — L97518 Non-pressure chronic ulcer of other part of right foot with other specified severity: Secondary | ICD-10-CM | POA: Diagnosis not present

## 2023-05-22 ENCOUNTER — Encounter (HOSPITAL_BASED_OUTPATIENT_CLINIC_OR_DEPARTMENT_OTHER): Admitting: Internal Medicine

## 2023-05-22 DIAGNOSIS — I87311 Chronic venous hypertension (idiopathic) with ulcer of right lower extremity: Secondary | ICD-10-CM | POA: Diagnosis not present

## 2023-05-22 DIAGNOSIS — L97518 Non-pressure chronic ulcer of other part of right foot with other specified severity: Secondary | ICD-10-CM | POA: Diagnosis not present

## 2023-05-22 DIAGNOSIS — I890 Lymphedema, not elsewhere classified: Secondary | ICD-10-CM | POA: Diagnosis not present

## 2023-05-22 DIAGNOSIS — E11621 Type 2 diabetes mellitus with foot ulcer: Secondary | ICD-10-CM

## 2023-05-22 DIAGNOSIS — S90811A Abrasion, right foot, initial encounter: Secondary | ICD-10-CM

## 2023-05-22 DIAGNOSIS — X58XXXA Exposure to other specified factors, initial encounter: Secondary | ICD-10-CM | POA: Diagnosis not present

## 2023-05-31 ENCOUNTER — Encounter (HOSPITAL_BASED_OUTPATIENT_CLINIC_OR_DEPARTMENT_OTHER): Admitting: Internal Medicine

## 2023-05-31 DIAGNOSIS — X58XXXA Exposure to other specified factors, initial encounter: Secondary | ICD-10-CM | POA: Diagnosis not present

## 2023-05-31 DIAGNOSIS — S90811A Abrasion, right foot, initial encounter: Secondary | ICD-10-CM | POA: Diagnosis not present

## 2023-05-31 DIAGNOSIS — I87311 Chronic venous hypertension (idiopathic) with ulcer of right lower extremity: Secondary | ICD-10-CM | POA: Diagnosis not present

## 2023-05-31 DIAGNOSIS — I890 Lymphedema, not elsewhere classified: Secondary | ICD-10-CM | POA: Diagnosis not present

## 2023-05-31 DIAGNOSIS — E11621 Type 2 diabetes mellitus with foot ulcer: Secondary | ICD-10-CM | POA: Diagnosis not present

## 2023-05-31 DIAGNOSIS — L97518 Non-pressure chronic ulcer of other part of right foot with other specified severity: Secondary | ICD-10-CM

## 2023-06-07 ENCOUNTER — Encounter (HOSPITAL_BASED_OUTPATIENT_CLINIC_OR_DEPARTMENT_OTHER): Admitting: Internal Medicine

## 2023-06-07 DIAGNOSIS — E11621 Type 2 diabetes mellitus with foot ulcer: Secondary | ICD-10-CM | POA: Diagnosis not present

## 2023-06-07 DIAGNOSIS — X58XXXA Exposure to other specified factors, initial encounter: Secondary | ICD-10-CM | POA: Diagnosis not present

## 2023-06-07 DIAGNOSIS — L97518 Non-pressure chronic ulcer of other part of right foot with other specified severity: Secondary | ICD-10-CM | POA: Diagnosis not present

## 2023-06-07 DIAGNOSIS — I87311 Chronic venous hypertension (idiopathic) with ulcer of right lower extremity: Secondary | ICD-10-CM | POA: Diagnosis not present

## 2023-06-07 DIAGNOSIS — S90811A Abrasion, right foot, initial encounter: Secondary | ICD-10-CM

## 2023-06-07 DIAGNOSIS — I890 Lymphedema, not elsewhere classified: Secondary | ICD-10-CM | POA: Diagnosis not present

## 2023-06-12 DIAGNOSIS — K080 Exfoliation of teeth due to systemic causes: Secondary | ICD-10-CM | POA: Diagnosis not present

## 2023-06-14 ENCOUNTER — Encounter (HOSPITAL_BASED_OUTPATIENT_CLINIC_OR_DEPARTMENT_OTHER): Attending: Internal Medicine | Admitting: Internal Medicine

## 2023-06-14 DIAGNOSIS — S90811A Abrasion, right foot, initial encounter: Secondary | ICD-10-CM | POA: Insufficient documentation

## 2023-06-14 DIAGNOSIS — E11621 Type 2 diabetes mellitus with foot ulcer: Secondary | ICD-10-CM | POA: Insufficient documentation

## 2023-06-14 DIAGNOSIS — X58XXXA Exposure to other specified factors, initial encounter: Secondary | ICD-10-CM | POA: Insufficient documentation

## 2023-06-14 DIAGNOSIS — I87311 Chronic venous hypertension (idiopathic) with ulcer of right lower extremity: Secondary | ICD-10-CM | POA: Diagnosis not present

## 2023-06-14 DIAGNOSIS — L97518 Non-pressure chronic ulcer of other part of right foot with other specified severity: Secondary | ICD-10-CM | POA: Diagnosis not present

## 2023-06-14 DIAGNOSIS — I890 Lymphedema, not elsewhere classified: Secondary | ICD-10-CM | POA: Diagnosis not present

## 2023-06-19 ENCOUNTER — Ambulatory Visit (INDEPENDENT_AMBULATORY_CARE_PROVIDER_SITE_OTHER): Payer: Medicare Other

## 2023-06-19 DIAGNOSIS — I5022 Chronic systolic (congestive) heart failure: Secondary | ICD-10-CM

## 2023-06-20 LAB — CUP PACEART REMOTE DEVICE CHECK
Battery Remaining Longevity: 67 mo
Battery Voltage: 2.99 V
Brady Statistic AP VP Percent: 99.57 %
Brady Statistic AP VS Percent: 0 %
Brady Statistic AS VP Percent: 0.21 %
Brady Statistic AS VS Percent: 0.22 %
Brady Statistic RA Percent Paced: 97.91 %
Brady Statistic RV Percent Paced: 98.17 %
Date Time Interrogation Session: 20250609001703
HighPow Impedance: 85 Ohm
Implantable Lead Connection Status: 753985
Implantable Lead Connection Status: 753985
Implantable Lead Implant Date: 20230823
Implantable Lead Implant Date: 20230823
Implantable Lead Location: 753859
Implantable Lead Location: 753860
Implantable Lead Model: 3830
Implantable Lead Model: 6935
Implantable Pulse Generator Implant Date: 20230823
Lead Channel Impedance Value: 266 Ohm
Lead Channel Impedance Value: 399 Ohm
Lead Channel Impedance Value: 437 Ohm
Lead Channel Sensing Intrinsic Amplitude: 12.125 mV
Lead Channel Sensing Intrinsic Amplitude: 12.125 mV
Lead Channel Sensing Intrinsic Amplitude: 4.125 mV
Lead Channel Sensing Intrinsic Amplitude: 4.125 mV
Lead Channel Setting Pacing Amplitude: 1.5 V
Lead Channel Setting Pacing Amplitude: 2.5 V
Lead Channel Setting Pacing Pulse Width: 0.4 ms
Lead Channel Setting Sensing Sensitivity: 0.3 mV
Zone Setting Status: 755011

## 2023-06-21 ENCOUNTER — Encounter (HOSPITAL_BASED_OUTPATIENT_CLINIC_OR_DEPARTMENT_OTHER): Admitting: Internal Medicine

## 2023-06-21 DIAGNOSIS — E11621 Type 2 diabetes mellitus with foot ulcer: Secondary | ICD-10-CM

## 2023-06-21 DIAGNOSIS — X58XXXA Exposure to other specified factors, initial encounter: Secondary | ICD-10-CM | POA: Diagnosis not present

## 2023-06-21 DIAGNOSIS — I87311 Chronic venous hypertension (idiopathic) with ulcer of right lower extremity: Secondary | ICD-10-CM

## 2023-06-21 DIAGNOSIS — I890 Lymphedema, not elsewhere classified: Secondary | ICD-10-CM | POA: Diagnosis not present

## 2023-06-21 DIAGNOSIS — L97518 Non-pressure chronic ulcer of other part of right foot with other specified severity: Secondary | ICD-10-CM | POA: Diagnosis not present

## 2023-06-21 DIAGNOSIS — S90811A Abrasion, right foot, initial encounter: Secondary | ICD-10-CM

## 2023-06-23 ENCOUNTER — Ambulatory Visit: Payer: Self-pay | Admitting: Internal Medicine

## 2023-06-23 DIAGNOSIS — G4733 Obstructive sleep apnea (adult) (pediatric): Secondary | ICD-10-CM | POA: Diagnosis not present

## 2023-06-25 ENCOUNTER — Other Ambulatory Visit (HOSPITAL_COMMUNITY): Payer: Self-pay | Admitting: Internal Medicine

## 2023-06-25 ENCOUNTER — Other Ambulatory Visit (HOSPITAL_COMMUNITY): Payer: Self-pay | Admitting: Family Medicine

## 2023-06-25 DIAGNOSIS — R001 Bradycardia, unspecified: Secondary | ICD-10-CM

## 2023-06-25 DIAGNOSIS — I4821 Permanent atrial fibrillation: Secondary | ICD-10-CM

## 2023-06-28 ENCOUNTER — Encounter (HOSPITAL_BASED_OUTPATIENT_CLINIC_OR_DEPARTMENT_OTHER): Admitting: Internal Medicine

## 2023-06-28 DIAGNOSIS — I87311 Chronic venous hypertension (idiopathic) with ulcer of right lower extremity: Secondary | ICD-10-CM | POA: Diagnosis not present

## 2023-06-28 DIAGNOSIS — X58XXXA Exposure to other specified factors, initial encounter: Secondary | ICD-10-CM | POA: Diagnosis not present

## 2023-06-28 DIAGNOSIS — E11621 Type 2 diabetes mellitus with foot ulcer: Secondary | ICD-10-CM | POA: Diagnosis not present

## 2023-06-28 DIAGNOSIS — L97518 Non-pressure chronic ulcer of other part of right foot with other specified severity: Secondary | ICD-10-CM

## 2023-06-28 DIAGNOSIS — I890 Lymphedema, not elsewhere classified: Secondary | ICD-10-CM | POA: Diagnosis not present

## 2023-06-28 DIAGNOSIS — S90811A Abrasion, right foot, initial encounter: Secondary | ICD-10-CM

## 2023-07-02 NOTE — Progress Notes (Signed)
 -  ADVANCED HF CLINIC NOTE   PCP: Loreli Elsie JONETTA Mickey., MD HF Cardiologist: Dr. Cherrie  Chief complaint: Heart Failure  HPI: [name] is a 68 y.o.y/o male w/ h/o chronic systolic heart failure, chronic afib, OSA, HTN, HLD, type 2DM, OSA on CPAP, ILD, tobacco use and venous stasis ulcers, followed by wound clinic. Also history of chemotherapy with potential cardiotoxic effects (R-CHOP for lymphoma in 2011).    Admitted in 02/2017 for acute hypoxic respiratory failure requiring intubation and ultimately tracheostomy. This was in the setting of multifocal PNA/ influenza and new onset atrial fibrillation w/ RVR. Echo showed mod LVH and severely reduced LVEF 25-30% w/ diffuse HK. RV normal. At the time, his CM was felt to be viral. Did not get cath. Per d/c summary, DCCV not pursued. Afib treated w/ rate control and Eliquis .    Repeat echo 07/2017 EF improved to 50-55%, RV mildl HK. He was noted to be in afib at the time of study.    8/2019v Zio showed Afib, well rated controlled.    Echo 11/2018 EF 45-50%, RV mildly reduced    Unfortunately, lost to f/u by cardiology for a period of time. Had been seeing [facility] for PAD, s/p left transmetatarsal amputation.  Echo 8/23 showed EF 30-35%, severe LVH, moderately reduced RV systolic function, moderate TVR.    Admitted 8/23 with pre-syncope, ECG showed AF w/ SVR, HR 28 BPM, RBBB -> developed worsening periods of high-grade AV block and lengthy pauses w/ hypotension. Underwent TVP with improvement in symptoms. R/LHC showed diffuse nonobstructive CAD, bi-V failure R>L, mild to moderate pulmonary venous hypertension. Underwent BiV ICD. Hospitalization complicated by chronic venous stasis wounds..  Echo 01/04/22: EF 30% RV moderately down   Follow up 04/11/22, massive volume overload, with NYHA III symptoms. Only taking torsemide  on weekends. Restarted torsemide  40 bid x 3 days, then down to 40 mg daily.  Today he returns for AHF follow up with his  friend. Says his weight up 15-18 pounds. Measuring his fluid intake and only drinking two 32 ounces of fluid per day. In early May got up from the table and was walking into kitchen and passed out for a sec. No prodrome. Hurt his elbow. EMT came vitals and ECG ok. No recurrence. SOB with mild activity. Sleeps on 2 pillows.   Cardiac Studies - Echo (12/23): EF 30%, RV moderately down - PYP (10/23): equivocal grade 1, H/CL = 1.06 - Echo (8/23): EF 30-35%, moderately decreased LV function, RV moderate reduced, moderate TR - R/LHC (8/23): Mid RCA lesion is 40% stenosed. RPDA lesion is 95% stenosed. Ost LM lesion is 30% stenosed. Mid LAD lesion is 30% stenosed.  2nd Diag lesion is 50% stenosed.  Mid Cx lesion is 30% stenosed. Ao = 109/58 (78), LV = 119/19, RA =  21, RV = 57/19, PA = 56/22 (36), PCW = 24 (v = 30), Fick cardiac output/index = 7.3/2.7, PVR = 1.8 WU, Ao sat = 99%, PA sat = 68%, 69%, PAPi = 2. 1. Mostly non-obstructive CAD with high-grade lesion in mid to distal RPDA 2. Nonischemic CM 3. Mild to moderate pulmonary venous HTN with evidence of RV dysfunction. Continue medical therapy. If patient develops angina can consider PCI of PDA; otherwise medical therapy.   Past Medical History:  Diagnosis Date   Acute systolic HF (heart failure) (HCC)    Arthritis    lt foot, hips knees    Atrial fibrillation (HCC)    Diabetes mellitus, new onset (  HCC)    Diverticulosis    Dyslipidemia    Gout attack 01/16/2012   Hypertension    Internal hemorrhoids    Lymphoma (HCC)    remission for about 2 years, chemo 3 years prior   Pneumonia due to COVID-19 virus 07/2018   Pulmonary hypertension (HCC)    Sepsis (HCC) 02/2017   secondary to influenza; requiring trach   Tubular adenoma of colon    Venous stasis ulcers (HCC)     Current Outpatient Medications  Medication Sig Dispense Refill   acetaminophen  (TYLENOL ) 325 MG tablet Take 650 mg by mouth every 6 (six) hours as needed for moderate pain.       ascorbic acid  (VITAMIN C ) 500 MG tablet Take 500 mg by mouth 2 (two) times daily.     atorvastatin  (LIPITOR) 40 MG tablet TAKE 1 TABLET BY MOUTH EVERY DAY 30 tablet 11   carvedilol  (COREG ) 3.125 MG tablet Take 1 tablet (3.125 mg total) by mouth 2 (two) times daily. NEEDS FOLLOW UP APPOINTMENT FOR MORE REFILLS 180 tablet 2   cholecalciferol  (VITAMIN D ) 25 MCG (1000 UNIT) tablet Take 1 tablet (1,000 Units total) by mouth daily. 30 tablet 0   colchicine  0.6 MG tablet Take 0.6 mg by mouth 2 (two) times daily as needed.     [facility] ASPIRIN  ADULT LOW DOSE 81 MG chewable tablet CHEW 1 TABLET BY MOUTH DAILY. 108 tablet 3   dapagliflozin  propanediol (FARXIGA ) 10 MG TABS tablet Take 1 tablet (10 mg total) by mouth daily. 90 tablet 1   ELIQUIS  5 MG TABS tablet TAKE 1 TABLET (5 MG TOTAL) BY MOUTH 2 (TWO) TIMES DAILY. NEEDS FOLLOW UP APPOINTMENT 180 tablet 1   Multiple Vitamins-Minerals (CENTRUM ADULTS PO) Take 1 tablet by mouth daily.     Omega 3 340 MG CPDR Take 1 capsule (340 mg total) by mouth 2 (two) times daily. 60 capsule 0   omeprazole  (PRILOSEC) 40 MG capsule Take 1 capsule (40 mg total) by mouth daily. 30 capsule 0   sacubitril -valsartan  (ENTRESTO ) 24-26 MG Take 1 tablet by mouth 2 (two) times daily. 60 tablet 3   sertraline  (ZOLOFT ) 50 MG tablet Take 1 tablet (50 mg total) by mouth daily. 30 tablet 0   spironolactone  (ALDACTONE ) 25 MG tablet Take 12.5 mg by mouth daily.     torsemide  (DEMADEX ) 20 MG tablet TAKE 2 TABLETS (40 MG TOTAL) BY MOUTH DAILY. 180 tablet 2   vitamin B-12 (CYANOCOBALAMIN ) 1000 MCG tablet Take 1 tablet (1,000 mcg total) by mouth daily. 30 tablet 0   No current facility-administered medications for this encounter.   Allergies  Allergen Reactions   Latex Hives and Swelling   Ace Inhibitors Other (See Comments)    Other reaction(s): cough   Penicillin G Hives and Rash    Has tolerated amoxicillin    Social History   Socioeconomic History   Marital status: Divorced     Spouse name: Not on file   Number of children: Not on file   Years of education: Not on file   Highest education level: Not on file  Occupational History   Occupation: cosmetologist  Tobacco Use   Smoking status: Former    Current packs/day: 0.00    Average packs/day: 0.5 packs/day for 20.0 years (10.0 ttl pk-yrs)    Types: Cigarettes    Start date: 01/10/1989    Quit date: 01/10/2009    Years since quitting: 14.4   Smokeless tobacco: Never  Vaping Use   Vaping status:  Never Used  Substance and Sexual Activity   Alcohol  use: Not Currently    Comment: 2-3 times per week   Drug use: No   Sexual activity: Not on file  Other Topics Concern   Not on file  Social History Narrative   Not on file   Social Drivers of Health   Financial Resource Strain: Not on file  Food Insecurity: No Food Insecurity (03/30/2023)   Hunger Vital Sign    Worried About Running Out of Food in the Last Year: Never true    Ran Out of Food in the Last Year: Never true  Transportation Needs: No Transportation Needs (03/30/2023)   PRAPARE - Administrator, Civil Service (Medical): No    Lack of Transportation (Non-Medical): No  Physical Activity: Not on file  Stress: Not on file  Social Connections: Moderately Isolated (03/30/2023)   Social Connection and Isolation Panel    Frequency of Communication with Friends and Family: More than three times a week    Frequency of Social Gatherings with Friends and Family: More than three times a week    Attends Religious Services: More than 4 times per year    Active Member of [employer] or Organizations: No    Attends Banker Meetings: Never    Marital Status: Divorced  Catering manager Violence: Not At Risk (03/30/2023)   Humiliation, Afraid, Rape, and Kick questionnaire    Fear of Current or Ex-Partner: No    Emotionally Abused: No    Physically Abused: No    Sexually Abused: No   Family History  Problem Relation Age of Onset   Dementia  Brother 56       frontal lobe    Dementia Mother    Arthritis Mother    Hypertension Mother    CVA Sister 79   Colon cancer Neg Hx    Pancreatic cancer Neg Hx    Stomach cancer Neg Hx    Rectal cancer Neg Hx    Liver cancer Neg Hx    BP 110/70   Pulse 73   Wt (!) 159.5 kg (351 lb 9.6 oz)   SpO2 97%   BMI 49.04 kg/m   Wt Readings from Last 3 Encounters:  07/03/23 (!) 159.5 kg (351 lb 9.6 oz)  04/10/23 (!) 150.4 kg (331 lb 9.6 oz)  04/03/23 (!) 150 kg (330 lb 12.8 oz)   PHYSICAL EXAM: General: Obese male No resp difficulty HEENT: normal Neck: supple. JVP hard to see  Cor:  Regular rate & rhythm. No rubs, gallops or murmurs. Lungs: clear Abdomen: obese soft, nontender, nondistended Good bowel sounds. Extremities: no cyanosis, clubbing, rash, 2-3+ edema RLE rapped with boot on Neuro: alert & orientedx3, cranial nerves grossly intact. moves all 4 extremities w/o difficulty. Affect pleasant   MDT device interrogation (personally reviewed): Fluid way up. No AF/VT. 100% biv pacing. Personally reviewed   ReDs reading: n/a  ECG (personally reviewed): n/a  ASSESSMENT & PLAN:  Chronic Systolic Biventricular Heart Failure - Echo (2/19): EF 25-30% w/ diffuse HK. RV normal. Felt to be viral CM +/- component of tachymediated in setting of influenza PNA and rapid Afib  - Echo (7/19): EF recovered, 50-55%, RV mildly reduced - Echo (8/23): EF 30-35%, severe LVH, moderately reduced RV systolic function, moderate TR - R/LHC (8/23): diffuse non-obstructive CAD with 90% lesion in dLAD, BiV failure R>L: RA 21 PA 56/22 (36) PCW 24 Fick 7.3/2.7 PaPi 2.1 - Myeloma panel and urine immunofixation negative.  -  PYP scan 10/23 equivocal. Resent Genetic testing. - Echo (3/25): EF 25-30%, mod LVH - s/p MDT CRT-D  - Stable NYHA II-III - Volume up 20 pounds - Increase torsemide  40 -> 60 mg daily.A dmm metoalzone 2.5 + kcl 40 every Monday. If unsuccessful will use Furoscix  (We also discussed  AquaPass trial but schedule doesn't permit) - Continue spironolactone  12.5 mg daily. - Continue Farxiga  10 mg daily.  - Continue carvedilol  3.125 mg bid  - Continue Entresto  24/26 mg bid - Will not titrate GMDT until fluid status stable - Labs today - See back 1 week  2. Presyncopal/Syncopal episode - ICD interrogated personally.  - No events   3. Profound Bradycardia w/ High-grade AV block - s/p BiV ICD. - Interrogation as above   4. Chronic Afib - Dates back to at least 2019, has been historically well rate controlled. - Continue CPAP.  - Continue Eliquis . No bleeding issues.   5. PAD/ Chronic Venous Stasis Wounds  - s/p left transmetatarsal amputation - Followed by [facility] and Wound clinic - Improving  6. H/o Lymphoma - s/p R-CHOP in 2011 (potential cardiotoxic effects)    7. Type 2DM - Hgb A1c 6.0 - On SGLT2i    8. CAD - LHC (8/23) diffuse mostly non-obstructive CAD, not cause of CM. - No s/s angina - If has angina, can consider PCI PDA - Continue statin. Off ASA with Eliquis    9. Morbid obesity - Body mass index is 49.04 kg/m. - insurance did not cover Mounjaro. Did not tolerate Ozempic - need to continue to pursue GLP1RA   10. ILD - Hi rest chest CT nonspecific, but ? NSIP vs chronic hypersensitivity pneumonitis - Followed by Pulmonary.   I spent a total of 45 minutes today: 1) reviewing the patient's medical records including previous charts, labs and recent notes from other providers; 2) examining the patient and counseling them on their medical issues/explaining the plan of care; 3) adjusting meds as needed and 4) ordering lab work or other needed tests.    Toribio Fuel, MD  9:52 AM

## 2023-07-03 ENCOUNTER — Ambulatory Visit (HOSPITAL_COMMUNITY)
Admission: RE | Admit: 2023-07-03 | Discharge: 2023-07-03 | Disposition: A | Discharge status: Home / Self Care | Source: Ambulatory Visit | Attending: Internal Medicine | Admitting: Internal Medicine

## 2023-07-03 ENCOUNTER — Encounter (HOSPITAL_COMMUNITY): Payer: Self-pay | Admitting: Internal Medicine

## 2023-07-03 ENCOUNTER — Ambulatory Visit (HOSPITAL_COMMUNITY): Payer: Self-pay | Admitting: Internal Medicine

## 2023-07-03 VITALS — BP 110/70 | HR 73 | Wt 351.6 lb

## 2023-07-03 DIAGNOSIS — I83009 Varicose veins of unspecified lower extremity with ulcer of unspecified site: Secondary | ICD-10-CM | POA: Diagnosis not present

## 2023-07-03 DIAGNOSIS — Z7901 Long term (current) use of anticoagulants: Secondary | ICD-10-CM | POA: Diagnosis not present

## 2023-07-03 DIAGNOSIS — Z9221 Personal history of antineoplastic chemotherapy: Secondary | ICD-10-CM | POA: Insufficient documentation

## 2023-07-03 DIAGNOSIS — Z87891 Personal history of nicotine dependence: Secondary | ICD-10-CM | POA: Insufficient documentation

## 2023-07-03 DIAGNOSIS — I739 Peripheral vascular disease, unspecified: Secondary | ICD-10-CM

## 2023-07-03 DIAGNOSIS — I482 Chronic atrial fibrillation, unspecified: Secondary | ICD-10-CM | POA: Insufficient documentation

## 2023-07-03 DIAGNOSIS — L97909 Non-pressure chronic ulcer of unspecified part of unspecified lower leg with unspecified severity: Secondary | ICD-10-CM | POA: Insufficient documentation

## 2023-07-03 DIAGNOSIS — I5082 Biventricular heart failure: Secondary | ICD-10-CM | POA: Diagnosis not present

## 2023-07-03 DIAGNOSIS — R55 Syncope and collapse: Secondary | ICD-10-CM

## 2023-07-03 DIAGNOSIS — I4439 Other atrioventricular block: Secondary | ICD-10-CM | POA: Insufficient documentation

## 2023-07-03 DIAGNOSIS — Z8572 Personal history of non-Hodgkin lymphomas: Secondary | ICD-10-CM | POA: Diagnosis not present

## 2023-07-03 DIAGNOSIS — J849 Interstitial pulmonary disease, unspecified: Secondary | ICD-10-CM | POA: Diagnosis not present

## 2023-07-03 DIAGNOSIS — Z7984 Long term (current) use of oral hypoglycemic drugs: Secondary | ICD-10-CM | POA: Diagnosis not present

## 2023-07-03 DIAGNOSIS — I110 Hypertensive heart disease with heart failure: Secondary | ICD-10-CM | POA: Diagnosis not present

## 2023-07-03 DIAGNOSIS — E785 Hyperlipidemia, unspecified: Secondary | ICD-10-CM | POA: Insufficient documentation

## 2023-07-03 DIAGNOSIS — I5022 Chronic systolic (congestive) heart failure: Secondary | ICD-10-CM | POA: Diagnosis not present

## 2023-07-03 DIAGNOSIS — G4733 Obstructive sleep apnea (adult) (pediatric): Secondary | ICD-10-CM | POA: Diagnosis not present

## 2023-07-03 DIAGNOSIS — R001 Bradycardia, unspecified: Secondary | ICD-10-CM | POA: Diagnosis not present

## 2023-07-03 DIAGNOSIS — Z79899 Other long term (current) drug therapy: Secondary | ICD-10-CM | POA: Diagnosis not present

## 2023-07-03 DIAGNOSIS — I2510 Atherosclerotic heart disease of native coronary artery without angina pectoris: Secondary | ICD-10-CM | POA: Diagnosis not present

## 2023-07-03 DIAGNOSIS — Z4502 Encounter for adjustment and management of automatic implantable cardiac defibrillator: Secondary | ICD-10-CM | POA: Diagnosis not present

## 2023-07-03 DIAGNOSIS — E119 Type 2 diabetes mellitus without complications: Secondary | ICD-10-CM | POA: Insufficient documentation

## 2023-07-03 DIAGNOSIS — Z6841 Body Mass Index (BMI) 40.0 and over, adult: Secondary | ICD-10-CM | POA: Insufficient documentation

## 2023-07-03 LAB — BRAIN NATRIURETIC PEPTIDE: B Natriuretic Peptide: 382.6 pg/mL — ABNORMAL HIGH (ref 0.0–100.0)

## 2023-07-03 LAB — BASIC METABOLIC PANEL WITH GFR
Anion gap: 15 (ref 5–15)
BUN: 20 mg/dL (ref 8–23)
CO2: 24 mmol/L (ref 22–32)
Calcium: 9 mg/dL (ref 8.9–10.3)
Chloride: 97 mmol/L — ABNORMAL LOW (ref 98–111)
Creatinine, Ser: 1.14 mg/dL (ref 0.61–1.24)
GFR, Estimated: >60 mL/min (ref >60)
Glucose, Bld: 101 mg/dL — ABNORMAL HIGH (ref 70–99)
Potassium: 2.9 mmol/L — ABNORMAL LOW (ref 3.5–5.1)
Sodium: 136 mmol/L (ref 135–145)

## 2023-07-03 MED ORDER — TORSEMIDE 20 MG PO TABS
60.0000 mg | ORAL_TABLET | Freq: Every day | ORAL | 2 refills | Status: DC
Start: 2023-07-03 — End: 2023-07-10

## 2023-07-03 MED ORDER — METOLAZONE 2.5 MG PO TABS: 2.5000 mg | ORAL_TABLET | ORAL | 3 refills | Status: DC

## 2023-07-03 MED ORDER — POTASSIUM CHLORIDE CRYS ER 20 MEQ PO TBCR: 40.0000 meq | EXTENDED_RELEASE_TABLET | ORAL | 3 refills | Status: DC

## 2023-07-03 NOTE — Patient Instructions (Signed)
 Medication Changes:  INCREASE Torsemide  to 60 mg (3 tabs) Daily  START Metolazone 2.5 mg every Monday  START Potassium (klor-con ) 40 meq (2 tabs) every Monday with Metolazone  Lab Work:  Labs done today, your results will be available in MyChart, we will contact you for abnormal readings.   Special Instructions // Education:  Do the following things EVERYDAY: Weigh yourself in the morning before breakfast. Write it down and keep it in a log. Take your medicines as prescribed Eat low salt foods--Limit salt (sodium) to 2000 mg per day.  Stay as active as you can everyday Limit all fluids for the day to less than 2 liters   Follow-Up in: 1 week   At the [facility], you and your health needs are our priority. We have a designated team specialized in the treatment of Heart Failure. This Care Team includes your primary Heart Failure Specialized Cardiologist (physician), Advanced Practice Providers (APPs- Physician Assistants and Nurse Practitioners), and Pharmacist who all work together to provide you with the care you need, when you need it.   You may see any of the following providers on your designated Care Team at your next follow up:  Dr. Toribio Fuel Dr. Ezra Shuck Dr. Ria Commander Dr. Odis Brownie Greig Mosses, NP [place]affie Shed, [place] [facility] [place], [place] Beckey Coe, NP [place] Lee, NP Tinnie Redman, PharmD   Please be sure to bring in all your medications bottles to every appointment.   Need to Contact Us :  If you have any questions or concerns before your next appointment please send us  a message through [facility] or call our office at [phone].    TO LEAVE A MESSAGE FOR THE NURSE SELECT OPTION 2, PLEASE LEAVE A MESSAGE INCLUDING: YOUR NAME DATE OF BIRTH CALL BACK NUMBER REASON FOR CALL**this is important as we prioritize the call backs  YOU WILL RECEIVE A CALL BACK THE SAME DAY AS LONG AS YOU CALL BEFORE 4:00 PM

## 2023-07-03 NOTE — Addendum Note (Signed)
 Encounter addended by: Buell Powell HERO, RN on: 07/03/2023 10:15 AM  Actions taken: Visit diagnoses modified, Pharmacy for encounter modified, Order list changed, Diagnosis association updated, Clinical Note Signed, Charge Capture section accepted

## 2023-07-04 NOTE — Telephone Encounter (Signed)
 Pt aware and voiced understanding Aware to takr 120 meq x 1 Further KCL dosing to come from provider

## 2023-07-05 ENCOUNTER — Encounter (HOSPITAL_BASED_OUTPATIENT_CLINIC_OR_DEPARTMENT_OTHER): Admitting: Internal Medicine

## 2023-07-05 DIAGNOSIS — L97518 Non-pressure chronic ulcer of other part of right foot with other specified severity: Secondary | ICD-10-CM | POA: Diagnosis not present

## 2023-07-05 DIAGNOSIS — I87311 Chronic venous hypertension (idiopathic) with ulcer of right lower extremity: Secondary | ICD-10-CM

## 2023-07-05 DIAGNOSIS — X58XXXA Exposure to other specified factors, initial encounter: Secondary | ICD-10-CM | POA: Diagnosis not present

## 2023-07-05 DIAGNOSIS — I890 Lymphedema, not elsewhere classified: Secondary | ICD-10-CM | POA: Diagnosis not present

## 2023-07-05 DIAGNOSIS — E11621 Type 2 diabetes mellitus with foot ulcer: Secondary | ICD-10-CM | POA: Diagnosis not present

## 2023-07-05 DIAGNOSIS — S90811A Abrasion, right foot, initial encounter: Secondary | ICD-10-CM | POA: Diagnosis not present

## 2023-07-05 MED ORDER — POTASSIUM CHLORIDE CRYS ER 20 MEQ PO TBCR: 40.0000 meq | EXTENDED_RELEASE_TABLET | Freq: Every day | ORAL | 3 refills | Status: DC

## 2023-07-05 NOTE — Addendum Note (Signed)
 Addended by: BUELL POWELL HERO on: 07/05/2023 05:45 PM   Modules accepted: Orders

## 2023-07-10 ENCOUNTER — Ambulatory Visit (HOSPITAL_COMMUNITY)
Admission: RE | Admit: 2023-07-10 | Discharge: 2023-07-10 | Disposition: A | Discharge status: Home / Self Care | Source: Ambulatory Visit | Attending: Cardiology | Admitting: Cardiology

## 2023-07-10 ENCOUNTER — Encounter (HOSPITAL_COMMUNITY): Payer: Self-pay

## 2023-07-10 VITALS — BP 100/60 | HR 72 | Wt 337.0 lb

## 2023-07-10 DIAGNOSIS — E785 Hyperlipidemia, unspecified: Secondary | ICD-10-CM | POA: Diagnosis not present

## 2023-07-10 DIAGNOSIS — I5022 Chronic systolic (congestive) heart failure: Secondary | ICD-10-CM | POA: Diagnosis not present

## 2023-07-10 DIAGNOSIS — Z7901 Long term (current) use of anticoagulants: Secondary | ICD-10-CM | POA: Diagnosis not present

## 2023-07-10 DIAGNOSIS — I5082 Biventricular heart failure: Secondary | ICD-10-CM | POA: Insufficient documentation

## 2023-07-10 DIAGNOSIS — Z9221 Personal history of antineoplastic chemotherapy: Secondary | ICD-10-CM | POA: Insufficient documentation

## 2023-07-10 DIAGNOSIS — I4439 Other atrioventricular block: Secondary | ICD-10-CM | POA: Diagnosis not present

## 2023-07-10 DIAGNOSIS — L97929 Non-pressure chronic ulcer of unspecified part of left lower leg with unspecified severity: Secondary | ICD-10-CM | POA: Diagnosis not present

## 2023-07-10 DIAGNOSIS — Z6841 Body Mass Index (BMI) 40.0 and over, adult: Secondary | ICD-10-CM | POA: Diagnosis not present

## 2023-07-10 DIAGNOSIS — R55 Syncope and collapse: Secondary | ICD-10-CM | POA: Diagnosis not present

## 2023-07-10 DIAGNOSIS — M109 Gout, unspecified: Secondary | ICD-10-CM | POA: Insufficient documentation

## 2023-07-10 DIAGNOSIS — E119 Type 2 diabetes mellitus without complications: Secondary | ICD-10-CM | POA: Diagnosis not present

## 2023-07-10 DIAGNOSIS — I482 Chronic atrial fibrillation, unspecified: Secondary | ICD-10-CM | POA: Insufficient documentation

## 2023-07-10 DIAGNOSIS — Z7984 Long term (current) use of oral hypoglycemic drugs: Secondary | ICD-10-CM | POA: Diagnosis not present

## 2023-07-10 DIAGNOSIS — I2510 Atherosclerotic heart disease of native coronary artery without angina pectoris: Secondary | ICD-10-CM | POA: Insufficient documentation

## 2023-07-10 DIAGNOSIS — I872 Venous insufficiency (chronic) (peripheral): Secondary | ICD-10-CM | POA: Diagnosis not present

## 2023-07-10 DIAGNOSIS — J849 Interstitial pulmonary disease, unspecified: Secondary | ICD-10-CM | POA: Diagnosis not present

## 2023-07-10 DIAGNOSIS — Z8572 Personal history of non-Hodgkin lymphomas: Secondary | ICD-10-CM | POA: Insufficient documentation

## 2023-07-10 DIAGNOSIS — Z87891 Personal history of nicotine dependence: Secondary | ICD-10-CM | POA: Diagnosis not present

## 2023-07-10 DIAGNOSIS — R001 Bradycardia, unspecified: Secondary | ICD-10-CM | POA: Diagnosis not present

## 2023-07-10 DIAGNOSIS — I110 Hypertensive heart disease with heart failure: Secondary | ICD-10-CM | POA: Insufficient documentation

## 2023-07-10 DIAGNOSIS — L97919 Non-pressure chronic ulcer of unspecified part of right lower leg with unspecified severity: Secondary | ICD-10-CM | POA: Diagnosis not present

## 2023-07-10 LAB — BRAIN NATRIURETIC PEPTIDE: B Natriuretic Peptide: 320.4 pg/mL — ABNORMAL HIGH (ref 0.0–100.0)

## 2023-07-10 LAB — BASIC METABOLIC PANEL WITH GFR
Anion gap: 13 (ref 5–15)
BUN: 30 mg/dL — ABNORMAL HIGH (ref 8–23)
CO2: 23 mmol/L (ref 22–32)
Calcium: 9.5 mg/dL (ref 8.9–10.3)
Chloride: 98 mmol/L (ref 98–111)
Creatinine, Ser: 1.2 mg/dL (ref 0.61–1.24)
GFR, Estimated: >60 mL/min (ref >60)
Glucose, Bld: 101 mg/dL — ABNORMAL HIGH (ref 70–99)
Potassium: 4.2 mmol/L (ref 3.5–5.1)
Sodium: 134 mmol/L — ABNORMAL LOW (ref 135–145)

## 2023-07-10 LAB — URIC ACID: Uric Acid, Serum: 14.8 mg/dL — ABNORMAL HIGH (ref 3.7–8.6)

## 2023-07-10 MED ORDER — PREDNISONE 20 MG PO TABS: 40.0000 mg | ORAL_TABLET | Freq: Every day | ORAL | 0 refills | Status: DC

## 2023-07-10 MED ORDER — TORSEMIDE 20 MG PO TABS: 80.0000 mg | ORAL_TABLET | Freq: Every day | ORAL | 2 refills | Status: DC

## 2023-07-10 NOTE — Progress Notes (Signed)
 -  ADVANCED HF CLINIC NOTE   PCP: Loreli Elsie JONETTA Mickey., MD HF Cardiologist: Dr. Cherrie  Chief complaint: Heart Failure  HPI: [name] is a 68 y.o.y/o male w/ h/o chronic systolic heart failure, chronic afib, OSA, HTN, HLD, type 2DM, OSA on CPAP, ILD, tobacco use and venous stasis ulcers, followed by wound clinic. Also history of chemotherapy with potential cardiotoxic effects (R-CHOP for lymphoma in 2011).    Admitted in 02/2017 for acute hypoxic respiratory failure requiring intubation and ultimately tracheostomy. This was in the setting of multifocal PNA/ influenza and new onset atrial fibrillation w/ RVR. Echo showed mod LVH and severely reduced LVEF 25-30% w/ diffuse HK. RV normal. At the time, his CM was felt to be viral. Did not get cath. Per d/c summary, DCCV not pursued. Afib treated w/ rate control and Eliquis .    Repeat echo 07/2017 EF improved to 50-55%, RV mildly HK. He was noted to be in afib at the time of study.    08/2017 Zio showed Afib, well rated controlled.    Echo 11/2018 EF 45-50%, RV mildly reduced.    Unfortunately, lost to f/u by cardiology for a period of time. Had been seeing [facility]VS for PAD, s/p left transmetatarsal amputation.  Echo 8/23 showed EF 30-35%, severe LVH, moderately reduced RV systolic function, moderate TVR.    Admitted 8/23 with pre-syncope, ECG showed AF w/ SVR, HR 28 BPM, RBBB -> developed worsening periods of high-grade AV block and lengthy pauses w/ hypotension. Underwent TVP with improvement in symptoms. R/LHC showed diffuse nonobstructive CAD, bi-V failure R>L, mild to moderate pulmonary venous hypertension. Underwent BiV ICD. Hospitalization complicated by chronic venous stasis wounds.  Echo 01/04/22: EF 30% RV moderately down   Follow up 04/11/22, massive volume overload, with NYHA III symptoms. Only taking torsemide  on weekends. Restarted torsemide  40 bid x 3 days, then down to 40 mg daily.  Seen in f/u last wk, 07/03/23. Wt was up 15-18  pounds. Had been measuring his fluid intake and only drinking 32 ounces of fluid per day. He reported NYHA Class II-III symptoms and 2 pillow orthopnea. He also reported that in early May, he got up from the table and was walking into kitchen and passed out for a sec. No prodrome. Hurt his elbow. EMT came and vitals and ECG were ok. No recurrence since that time. At visit, diuretics were adjusted. Torsemide  was increased to 60 mg daily + once wkly metolazone  (2.5 mg qMondays).   He presents back today for f/u and to reassess volume status. Wt down 14 lb, from 351>>337 lb today. He reports feeling better but still feels like he is retaining some fluid. Device interrogation also suggest ongoing fluid accumulation, Impedence trending up but remains below reference curve and fluid index > threshold. No AF/AT. No VT/VF. 100% bi-v pacing.  He denies resting dyspnea. Reports stable NYHA Class II-III symptoms, confounded by obesity and deconditioning. His only complaint is he thinks he has an acute gout flare in right wrist. Rt hand is swollen and tender to touch.   He just took and metolazone  tablet today. He says he hasn't notice a significant increase in UOP on metolazone  days. Urine output w/ increased dose of torsemide  is good.    Cardiac Studies - Echo (12/23): EF 30%, RV moderately down - PYP (10/23): equivocal grade 1, H/CL = 1.06 - Echo (8/23): EF 30-35%, moderately decreased LV function, RV moderate reduced, moderate TR - R/LHC (8/23): Mid RCA lesion is 40%  stenosed. RPDA lesion is 95% stenosed. Ost LM lesion is 30% stenosed. Mid LAD lesion is 30% stenosed.  2nd Diag lesion is 50% stenosed.  Mid Cx lesion is 30% stenosed. Ao = 109/58 (78), LV = 119/19, RA =  21, RV = 57/19, PA = 56/22 (36), PCW = 24 (v = 30), Fick cardiac output/index = 7.3/2.7, PVR = 1.8 WU, Ao sat = 99%, PA sat = 68%, 69%, PAPi = 2. 1. Mostly non-obstructive CAD with high-grade lesion in mid to distal RPDA 2. Nonischemic CM 3. Mild to  moderate pulmonary venous HTN with evidence of RV dysfunction. Continue medical therapy. If patient develops angina can consider PCI of PDA; otherwise medical therapy.   Past Medical History:  Diagnosis Date   Acute systolic HF (heart failure) (HCC)    Arthritis    lt foot, hips knees    Atrial fibrillation (HCC)    Diabetes mellitus, new onset (HCC)    Diverticulosis    Dyslipidemia    Gout attack 01/16/2012   Hypertension    Internal hemorrhoids    Lymphoma (HCC)    remission for about 2 years, chemo 3 years prior   Pneumonia due to COVID-19 virus 07/2018   Pulmonary hypertension (HCC)    Sepsis (HCC) 02/2017   secondary to influenza; requiring trach   Tubular adenoma of colon    Venous stasis ulcers (HCC)     Current Outpatient Medications  Medication Sig Dispense Refill   acetaminophen  (TYLENOL ) 325 MG tablet Take 650 mg by mouth every 6 (six) hours as needed for moderate pain.      ascorbic acid  (VITAMIN C ) 500 MG tablet Take 500 mg by mouth 2 (two) times daily.     atorvastatin  (LIPITOR) 40 MG tablet TAKE 1 TABLET BY MOUTH EVERY DAY 30 tablet 11   carvedilol  (COREG ) 3.125 MG tablet Take 1 tablet (3.125 mg total) by mouth 2 (two) times daily. NEEDS FOLLOW UP APPOINTMENT FOR MORE REFILLS 180 tablet 2   Cholecalciferol  (VITAMIN D3) 1000 units CAPS Take 1 capsule by mouth daily.     colchicine  0.6 MG tablet Take 0.6 mg by mouth 2 (two) times daily as needed.     [facility] ASPIRIN  ADULT LOW DOSE 81 MG chewable tablet CHEW 1 TABLET BY MOUTH DAILY. 108 tablet 3   dapagliflozin  propanediol (FARXIGA ) 10 MG TABS tablet Take 1 tablet (10 mg total) by mouth daily. 90 tablet 1   ELIQUIS  5 MG TABS tablet TAKE 1 TABLET (5 MG TOTAL) BY MOUTH 2 (TWO) TIMES DAILY. NEEDS FOLLOW UP APPOINTMENT 180 tablet 1   metolazone  (ZAROXOLYN ) 2.5 MG tablet Take 1 tablet (2.5 mg total) by mouth once a week. Every Monday 5 tablet 3   Multiple Vitamins-Minerals (CENTRUM ADULTS PO) Take 1 tablet by mouth daily.      Omega 3 340 MG CPDR Take 1 capsule (340 mg total) by mouth 2 (two) times daily. 60 capsule 0   omeprazole  (PRILOSEC) 40 MG capsule Take 1 capsule (40 mg total) by mouth daily. 30 capsule 0   potassium chloride  SA (KLOR-CON  M) 20 MEQ tablet Take 2 tablets (40 mEq total) by mouth daily. Take extra 2 tabs every Monday with metolazone  70 tablet 3   sacubitril -valsartan  (ENTRESTO ) 24-26 MG Take 1 tablet by mouth 2 (two) times daily. 60 tablet 3   sertraline  (ZOLOFT ) 50 MG tablet Take 1 tablet (50 mg total) by mouth daily. 30 tablet 0   spironolactone  (ALDACTONE ) 25 MG tablet Take 12.5 mg  by mouth daily.     torsemide  (DEMADEX ) 20 MG tablet Take 3 tablets (60 mg total) by mouth daily. 90 tablet 2   vitamin B-12 (CYANOCOBALAMIN ) 1000 MCG tablet Take 1 tablet (1,000 mcg total) by mouth daily. 30 tablet 0   No current facility-administered medications for this encounter.   Allergies  Allergen Reactions   Latex Hives and Swelling   Ace Inhibitors Other (See Comments)    Other reaction(s): cough   Penicillin G Hives and Rash    Has tolerated amoxicillin    Social History   Socioeconomic History   Marital status: Divorced    Spouse name: Not on file   Number of children: Not on file   Years of education: Not on file   Highest education level: Not on file  Occupational History   Occupation: cosmetologist  Tobacco Use   Smoking status: Former    Current packs/day: 0.00    Average packs/day: 0.5 packs/day for 20.0 years (10.0 ttl pk-yrs)    Types: Cigarettes    Start date: 01/10/1989    Quit date: 01/10/2009    Years since quitting: 14.5   Smokeless tobacco: Never  Vaping Use   Vaping status: Never Used  Substance and Sexual Activity   Alcohol  use: Not Currently    Comment: 2-3 times per week   Drug use: No   Sexual activity: Not on file  Other Topics Concern   Not on file  Social History Narrative   Not on file   Social Drivers of Health   Financial Resource Strain: Not on file   Food Insecurity: No Food Insecurity (03/30/2023)   Hunger Vital Sign    Worried About Running Out of Food in the Last Year: Never true    Ran Out of Food in the Last Year: Never true  Transportation Needs: No Transportation Needs (03/30/2023)   PRAPARE - Administrator, Civil Service (Medical): No    Lack of Transportation (Non-Medical): No  Physical Activity: Not on file  Stress: Not on file  Social Connections: Moderately Isolated (03/30/2023)   Social Connection and Isolation Panel    Frequency of Communication with Friends and Family: More than three times a week    Frequency of Social Gatherings with Friends and Family: More than three times a week    Attends Religious Services: More than 4 times per year    Active Member of [employer] or Organizations: No    Attends Banker Meetings: Never    Marital Status: Divorced  Catering manager Violence: Not At Risk (03/30/2023)   Humiliation, Afraid, Rape, and Kick questionnaire    Fear of Current or Ex-Partner: No    Emotionally Abused: No    Physically Abused: No    Sexually Abused: No   Family History  Problem Relation Age of Onset   Dementia Brother 78       frontal lobe    Dementia Mother    Arthritis Mother    Hypertension Mother    CVA Sister 71   Colon cancer Neg Hx    Pancreatic cancer Neg Hx    Stomach cancer Neg Hx    Rectal cancer Neg Hx    Liver cancer Neg Hx    Wt (!) 152.9 kg (337 lb)   BMI 47.00 kg/m   Wt Readings from Last 3 Encounters:  07/10/23 (!) 152.9 kg (337 lb)  07/03/23 (!) 159.5 kg (351 lb 9.6 oz)  04/10/23 (!) 150.4 kg (331 lb  9.6 oz)   PHYSICAL EXAM: General:  Well appearing, obese. No respiratory difficulty HEENT: normal Neck: supple. JVD 10 cm. Carotids 2+ bilat; no bruits. No lymphadenopathy or thyromegaly appreciated. Cor: PMI nondisplaced. Regular rate & rhythm. No rubs, gallops or murmurs. Lungs: clear Abdomen: obese, nontender, nondistended. No hepatosplenomegaly.  No bruits or masses. Good bowel sounds. Extremities: no cyanosis, clubbing, rash, trace b/l LEE edema, rt LE wrapped  Neuro: alert & oriented x 3, cranial nerves grossly intact. moves all 4 extremities w/o difficulty. Affect pleasant.    MDT device interrogation (personally reviewed): ongoing fluid accumulation since April 2025, Fluid index > threshold.  No AF/VT. 100% biv pacing. Personally reviewed   ReDs reading: n/a  ECG (personally reviewed): n/a  ASSESSMENT & PLAN:  Chronic Systolic Biventricular Heart Failure - Echo (2/19): EF 25-30% w/ diffuse HK. RV normal. Felt to be viral CM +/- component of tachymediated in setting of influenza PNA and rapid Afib  - Echo (7/19): EF recovered, 50-55%, RV mildly reduced - Echo (8/23): EF 30-35%, severe LVH, moderately reduced RV systolic function, moderate TR - R/LHC (8/23): diffuse non-obstructive CAD with 90% lesion in dLAD, BiV failure R>L: RA 21 PA 56/22 (36) PCW 24 Fick 7.3/2.7 PaPi 2.1 - Myeloma panel and urine immunofixation negative.  - PYP scan 10/23 equivocal. Gene testing 11/23 negative  - Echo (3/25): EF 25-30%, mod LVH - s/p MDT CRT-D  - NYHA Class II-III, stable and confounded by obesity and deconditioning  - volume status improving w/ diuretic increase, wt down 14 lb, but remains fluid overloaded on exam and device interrogation  - increase torsemide  to 80 mg daily. Continue once weekly metolazone  2.5 mg qMondays  - increase spironolactone  to 25 mg daily  - will enroll in ICM program for monthly volume assessment  - Continue Farxiga  10 mg daily  - Continue carvedilol  3.125 mg bid  - Continue Entresto  24/26 mg bid. BP too soft for titration  - check BMP and BNP today   2. Presyncopal/Syncopal episode - had episode 5/25. Device interrogation showed no correlating arrhthymias  - no further recurrence  - ICD interrogated today. No VT/VF     3. Profound Bradycardia w/ High-grade AV block - s/p BiV ICD. - Interrogation as  above   4. Chronic Afib - Dates back to at least 2019, has been historically well rate controlled. - no AF detection on device interrogation  - Continue CPAP.  - Continue Eliquis .    5. PAD/ Chronic Venous Stasis Wounds  - s/p left transmetatarsal amputation - Followed by [facility]VS and Wound clinic - Improving  6. H/o Lymphoma - s/p R-CHOP in 2011 (potential cardiotoxic effects)    7. Type 2DM - Hgb A1c 6.0 - On SGLT2i    8. CAD - LHC (8/23) diffuse mostly non-obstructive CAD, not cause of CM. - denies CP  - If has angina, can consider PCI PDA - Continue statin. Off ASA with Eliquis    9. Morbid obesity - Body mass index is 47 kg/m. - insurance did not cover Mounjaro. Did not tolerate Ozempic   10. ILD - Hi rest chest CT nonspecific, but ? NSIP vs chronic hypersensitivity pneumonitis - Followed by Pulmonary.   11. Acute Gout Flare - rt wrist, in setting of loop diuretic increase - check uric acid - treat w/ prednisone  40 mg daily x 3 doses   Enroll in monthly ICM device clinic for remote fluid monitoring. Send remote transmission in 4 wks. Provider f/u in 2 months.  He will contact us  sooner if wt increases or worsening symptoms.     Caffie Shed, PA-C  3:29 PM

## 2023-07-10 NOTE — Patient Instructions (Signed)
 Medication Changes:  INCREASE TORSEMIDE  TO 80MG  ONCE DAILY   START: PREDNISONE  40MG  DAILY FOR 3 DAYS   Lab Work:  Labs done today, your results will be available in MyChart, we will contact you for abnormal readings.  Special Instructions // Education:  MESSAGE SENT TO DEVICE CLINIC FOR THEM TO ENROLL YOU IN REMOTE DEVICE MONITORING   Follow-Up in: 2 MONTHS AS SCHEDULED WITH APP   At the [facility], you and your health needs are our priority. We have a designated team specialized in the treatment of Heart Failure. This Care Team includes your primary Heart Failure Specialized Cardiologist (physician), Advanced Practice Providers (APPs- Physician Assistants and Nurse Practitioners), and Pharmacist who all work together to provide you with the care you need, when you need it.   You may see any of the following providers on your designated Care Team at your next follow up:  Dr. Toribio Fuel Dr. Ezra Shuck Dr. Ria Commander Dr. Odis Brownie Greig Mosses, NP Caffie Shed, [place] [facility] [place], [place] Beckey Coe, NP [place] Lee, NP Tinnie Redman, PharmD   Please be sure to bring in all your medications bottles to every appointment.   Need to Contact Us :  If you have any questions or concerns before your next appointment please send us  a message through [place] or call our office at [phone].    TO LEAVE A MESSAGE FOR THE NURSE SELECT OPTION 2, PLEASE LEAVE A MESSAGE INCLUDING: YOUR NAME DATE OF BIRTH CALL BACK NUMBER REASON FOR CALL**this is important as we prioritize the call backs  YOU WILL RECEIVE A CALL BACK THE SAME DAY AS LONG AS YOU CALL BEFORE 4:00 PM

## 2023-07-11 ENCOUNTER — Ambulatory Visit (HOSPITAL_COMMUNITY): Payer: Self-pay | Admitting: Cardiology

## 2023-07-11 DIAGNOSIS — I5022 Chronic systolic (congestive) heart failure: Secondary | ICD-10-CM

## 2023-07-12 ENCOUNTER — Encounter (HOSPITAL_BASED_OUTPATIENT_CLINIC_OR_DEPARTMENT_OTHER): Attending: Internal Medicine | Admitting: General Surgery

## 2023-07-12 DIAGNOSIS — L97518 Non-pressure chronic ulcer of other part of right foot with other specified severity: Secondary | ICD-10-CM | POA: Insufficient documentation

## 2023-07-12 DIAGNOSIS — E11621 Type 2 diabetes mellitus with foot ulcer: Secondary | ICD-10-CM | POA: Insufficient documentation

## 2023-07-12 DIAGNOSIS — S90811A Abrasion, right foot, initial encounter: Secondary | ICD-10-CM | POA: Insufficient documentation

## 2023-07-12 DIAGNOSIS — I890 Lymphedema, not elsewhere classified: Secondary | ICD-10-CM | POA: Insufficient documentation

## 2023-07-12 DIAGNOSIS — I87311 Chronic venous hypertension (idiopathic) with ulcer of right lower extremity: Secondary | ICD-10-CM | POA: Insufficient documentation

## 2023-07-12 NOTE — Telephone Encounter (Signed)
 LMOM

## 2023-07-17 ENCOUNTER — Ambulatory Visit (HOSPITAL_COMMUNITY)
Admission: RE | Admit: 2023-07-17 | Discharge: 2023-07-17 | Disposition: A | Discharge status: Home / Self Care | Source: Ambulatory Visit | Attending: Cardiology | Admitting: Cardiology

## 2023-07-17 ENCOUNTER — Encounter

## 2023-07-17 ENCOUNTER — Encounter: Payer: Self-pay | Admitting: Internal Medicine

## 2023-07-17 DIAGNOSIS — I5022 Chronic systolic (congestive) heart failure: Secondary | ICD-10-CM | POA: Diagnosis not present

## 2023-07-17 LAB — BASIC METABOLIC PANEL WITH GFR
Anion gap: 16 — ABNORMAL HIGH (ref 5–15)
BUN: 70 mg/dL — ABNORMAL HIGH (ref 8–23)
CO2: 22 mmol/L (ref 22–32)
Calcium: 10 mg/dL (ref 8.9–10.3)
Chloride: 90 mmol/L — ABNORMAL LOW (ref 98–111)
Creatinine, Ser: 1.36 mg/dL — ABNORMAL HIGH (ref 0.61–1.24)
GFR, Estimated: 57 mL/min — ABNORMAL LOW
Glucose, Bld: 120 mg/dL — ABNORMAL HIGH (ref 70–99)
Potassium: 5.1 mmol/L (ref 3.5–5.1)
Sodium: 128 mmol/L — ABNORMAL LOW (ref 135–145)

## 2023-07-17 LAB — BRAIN NATRIURETIC PEPTIDE: B Natriuretic Peptide: 166.3 pg/mL — ABNORMAL HIGH (ref 0.0–100.0)

## 2023-07-17 NOTE — Patient Instructions (Incomplete)
 Follow up as scheduled with Dr. Waddell.

## 2023-07-18 ENCOUNTER — Telehealth: Payer: Self-pay

## 2023-07-18 NOTE — Telephone Encounter (Signed)
 Pt called in yesterday to advise his device had alarmed.  Pt's RV defib impedance had registered at 136 ohms.  Alarm was set to alert at 130 ohms.  Pt scheduled for device clinic but did not show up.  Will follow up.

## 2023-07-18 NOTE — Telephone Encounter (Signed)
 Attempted to contact the patient due to a no show for his device clinic appointment yesterday. No answer- I left a message to please call back to the Device Clinic.

## 2023-07-19 ENCOUNTER — Ambulatory Visit: Attending: Cardiovascular Disease

## 2023-07-19 ENCOUNTER — Encounter (HOSPITAL_BASED_OUTPATIENT_CLINIC_OR_DEPARTMENT_OTHER): Admitting: Internal Medicine

## 2023-07-19 DIAGNOSIS — I87311 Chronic venous hypertension (idiopathic) with ulcer of right lower extremity: Secondary | ICD-10-CM | POA: Diagnosis not present

## 2023-07-19 DIAGNOSIS — E11621 Type 2 diabetes mellitus with foot ulcer: Secondary | ICD-10-CM

## 2023-07-19 DIAGNOSIS — S90811A Abrasion, right foot, initial encounter: Secondary | ICD-10-CM | POA: Diagnosis not present

## 2023-07-19 DIAGNOSIS — I890 Lymphedema, not elsewhere classified: Secondary | ICD-10-CM | POA: Diagnosis not present

## 2023-07-19 DIAGNOSIS — I428 Other cardiomyopathies: Secondary | ICD-10-CM

## 2023-07-19 DIAGNOSIS — L97518 Non-pressure chronic ulcer of other part of right foot with other specified severity: Secondary | ICD-10-CM | POA: Diagnosis not present

## 2023-07-19 DIAGNOSIS — I442 Atrioventricular block, complete: Secondary | ICD-10-CM

## 2023-07-19 NOTE — Progress Notes (Signed)
 Patient brought in today after receiving HIGH ALERT for RV Defib impedance.  He reports having 2 falls approximately 1 month ago.  At the end of June/early July RV Defib impedance trend has begun to climb.  Trend now in the 130's-140 ohms.    Device interrogated. (NOTE: One RV (3830) lead is a Left bundle lead in the RA port, 2ND RV lead is the acting defib lead with impedance issues).  RA port lead and RV lead sensing, pacing thresholds and impedances (other than defib impedance) are all stable and normal.  No current episodes.  Patient has no hx of therapy from device and only a few very short NSVT events reported since implant in 2023.  Reviewed all with Dr. Waddell.  For now we will continue to monitor until patient comes in next week on 7/17 to see Dr. Waddell in the clinic for review and next steps.   PROGRAMMING CHANGES: TONE alerts turned off for patient but wireless alerts for monitoring will continue for the clinic to receive.  I did leave on for patient the tone alert if there is a new warning for RV lead noise as there has been no history of this and this would be an important new finding.

## 2023-07-19 NOTE — Telephone Encounter (Signed)
 Patient called back and scheduled this afternoon at 415pm to evaluate lead in device clinic.

## 2023-07-23 DIAGNOSIS — G4733 Obstructive sleep apnea (adult) (pediatric): Secondary | ICD-10-CM | POA: Diagnosis not present

## 2023-07-24 DIAGNOSIS — K080 Exfoliation of teeth due to systemic causes: Secondary | ICD-10-CM | POA: Diagnosis not present

## 2023-07-26 ENCOUNTER — Encounter (HOSPITAL_BASED_OUTPATIENT_CLINIC_OR_DEPARTMENT_OTHER): Admitting: Internal Medicine

## 2023-07-26 DIAGNOSIS — E11621 Type 2 diabetes mellitus with foot ulcer: Secondary | ICD-10-CM

## 2023-07-26 DIAGNOSIS — I87311 Chronic venous hypertension (idiopathic) with ulcer of right lower extremity: Secondary | ICD-10-CM | POA: Diagnosis not present

## 2023-07-26 DIAGNOSIS — I890 Lymphedema, not elsewhere classified: Secondary | ICD-10-CM | POA: Diagnosis not present

## 2023-07-26 DIAGNOSIS — S90811A Abrasion, right foot, initial encounter: Secondary | ICD-10-CM | POA: Diagnosis not present

## 2023-07-26 DIAGNOSIS — L97518 Non-pressure chronic ulcer of other part of right foot with other specified severity: Secondary | ICD-10-CM | POA: Diagnosis not present

## 2023-07-27 ENCOUNTER — Ambulatory Visit: Attending: Internal Medicine | Admitting: Internal Medicine

## 2023-07-27 ENCOUNTER — Encounter: Payer: Self-pay | Admitting: Internal Medicine

## 2023-07-27 VITALS — BP 96/67 | HR 72 | Ht 71.0 in | Wt 320.0 lb

## 2023-07-27 DIAGNOSIS — I4821 Permanent atrial fibrillation: Secondary | ICD-10-CM | POA: Diagnosis not present

## 2023-07-27 NOTE — Patient Instructions (Signed)
 Medication Instructions:  Your physician recommends that you continue on your current medications as directed. Please refer to the Current Medication list given to you today.  *If you need a refill on your cardiac medications before your next appointment, please call your pharmacy*  Lab Work: None ordered.  You may go to any [facility] Location for your lab work:  [facility] - [address] ([facility]) - [address] [phone] [address]  [place] - [address]   [place]  - [address]   [place] - [address] - [address] [employer]  - [address] [place] - [address] ([facility]   If you have labs (blood work) drawn today and your tests are completely normal, you will receive your results only by: [employer] (if you have MyChart)  If you have any lab test that is abnormal or we need to change your treatment, we will call you or send a MyChart message to review the results.  Testing/Procedures: None ordered.  Follow-Up: At [facility], you and your health needs are our priority.  As part of our continuing mission to provide you with exceptional heart care, we have created designated Provider Care Teams.  These Care Teams include your primary Cardiologist (physician) and Advanced Practice Providers (APPs -  Physician Assistants and Nurse Practitioners) who all work together to provide you with the care you need, when you need it.  Your next appointment:   1 year(s)  The format for your next appointment:   In Person  Provider:   Donnice Primus, MD or one of the following Advanced Practice Providers on your designated Care Team:   Charlies Arthur, [place] Ozell Jodie Passey, [place] Leotis Barrack, NP  Note: Remote monitoring is used to monitor your Pacemaker/ ICD from home. This monitoring reduces the number of office visits required to check  your device to one time per year. It allows us  to keep an eye on the functioning of your device to ensure it is working properly.

## 2023-07-27 NOTE — Progress Notes (Signed)
 HPI Mr. Thorstenson returns today for ICD followup. He is a pleasant 68 yo man with a h/o chronic systolic heart failure, s/p ICD insertion. He has a h/o chemo induced CM, chronic atrial fib and sleep apnea. He underwent biv ICD insertion several months ago with a left bundle lead placed in the atrial port and a standard ICD lead placed in the RV apical septum. He has a h/o longstanding atrial fib. His QRS duration pre ICD was 160 and today measures 82 ms but I think is closer to 110 ms. His post procedure EF did not improved but his wife thinks that he is much better. He has chronic peripheral edema. He has had a gradual rise in his ICD shock impedence. The rest of his numbers are stable.  Allergies  Allergen Reactions   Latex Hives and Swelling   Ace Inhibitors Other (See Comments)    Other reaction(s): cough   Penicillin G Hives and Rash    Has tolerated amoxicillin      Current Outpatient Medications  Medication Sig Dispense Refill   acetaminophen  (TYLENOL ) 325 MG tablet Take 650 mg by mouth every 6 (six) hours as needed for moderate pain.      ascorbic acid  (VITAMIN C ) 500 MG tablet Take 500 mg by mouth 2 (two) times daily.     atorvastatin  (LIPITOR) 40 MG tablet TAKE 1 TABLET BY MOUTH EVERY DAY 30 tablet 11   carvedilol  (COREG ) 3.125 MG tablet Take 1 tablet (3.125 mg total) by mouth 2 (two) times daily. NEEDS FOLLOW UP APPOINTMENT FOR MORE REFILLS 180 tablet 2   Cholecalciferol  (VITAMIN D3) 1000 units CAPS Take 1 capsule by mouth daily.     colchicine  0.6 MG tablet Take 0.6 mg by mouth 2 (two) times daily as needed.     [facility] ASPIRIN  ADULT LOW DOSE 81 MG chewable tablet CHEW 1 TABLET BY MOUTH DAILY. 108 tablet 3   dapagliflozin  propanediol (FARXIGA ) 10 MG TABS tablet Take 1 tablet (10 mg total) by mouth daily. 90 tablet 1   ELIQUIS  5 MG TABS tablet TAKE 1 TABLET (5 MG TOTAL) BY MOUTH 2 (TWO) TIMES DAILY. NEEDS FOLLOW UP APPOINTMENT 180 tablet 1   metolazone  (ZAROXOLYN ) 2.5 MG tablet Take  1 tablet (2.5 mg total) by mouth once a week. Every Monday 5 tablet 3   Multiple Vitamins-Minerals (CENTRUM ADULTS PO) Take 1 tablet by mouth daily.     Omega 3 340 MG CPDR Take 1 capsule (340 mg total) by mouth 2 (two) times daily. 60 capsule 0   omeprazole  (PRILOSEC) 40 MG capsule Take 1 capsule (40 mg total) by mouth daily. 30 capsule 0   potassium chloride  SA (KLOR-CON  M) 20 MEQ tablet Take 2 tablets (40 mEq total) by mouth daily. Take extra 2 tabs every Monday with metolazone  70 tablet 3   predniSONE  (DELTASONE ) 20 MG tablet Take 2 tablets (40 mg total) by mouth daily with breakfast. FOR 3 DAYS 6 tablet 0   sacubitril -valsartan  (ENTRESTO ) 24-26 MG Take 1 tablet by mouth 2 (two) times daily. 60 tablet 3   sertraline  (ZOLOFT ) 50 MG tablet Take 1 tablet (50 mg total) by mouth daily. 30 tablet 0   spironolactone  (ALDACTONE ) 25 MG tablet Take 12.5 mg by mouth daily.     torsemide  (DEMADEX ) 20 MG tablet Take 4 tablets (80 mg total) by mouth daily. 120 tablet 2   vitamin B-12 (CYANOCOBALAMIN ) 1000 MCG tablet Take 1 tablet (1,000 mcg total) by mouth daily.  30 tablet 0   No current facility-administered medications for this visit.     Past Medical History:  Diagnosis Date   Acute systolic HF (heart failure) (HCC)    Arthritis    lt foot, hips knees    Atrial fibrillation (HCC)    Diabetes mellitus, new onset (HCC)    Diverticulosis    Dyslipidemia    Gout attack 01/16/2012   Hypertension    Internal hemorrhoids    Lymphoma (HCC)    remission for about 2 years, chemo 3 years prior   Pneumonia due to COVID-19 virus 07/2018   Pulmonary hypertension (HCC)    Sepsis (HCC) 02/2017   secondary to influenza; requiring trach   Tubular adenoma of colon    Venous stasis ulcers (HCC)     ROS:   All systems reviewed and negative except as noted in the HPI.   Past Surgical History:  Procedure Laterality Date   ABDOMINAL AORTOGRAM W/LOWER EXTREMITY N/A 08/14/2020   Procedure: ABDOMINAL  AORTOGRAM W/LOWER EXTREMITY;  Surgeon: Harvey Carlin BRAVO, MD;  Location: [facility];  Service: Cardiovascular;  Laterality: N/A;   AMPUTATION TOE Left 05/08/2019   Procedure: AMPUTATION LEFT GREAT  TOE;  Surgeon: Gershon Donnice SAUNDERS, DPM;  Location: [facility] OR[facility];  Service: Podiatry;  Laterality: Left;   ARTERIAL LINE INSERTION Right 08/26/2021   Procedure: ARTERIAL LINE INSERTION;  Surgeon: Wendel Lurena POUR, MD;  Location: [facility];  Service: Cardiovascular;  Laterality: Right;  rt radial   BIV ICD INSERTION CRT-D N/A 09/01/2021   Procedure: BIV ICD INSERTION CRT-D;  Surgeon: Waddell Danelle ORN, MD;  Location: [facility] INV[facility] CV LAB;  Service: Cardiovascular;  Laterality: N/A;   BRONCHOSCOPY     ESOPHAGOGASTRODUODENOSCOPY ENDOSCOPY     Dr Merleen FONTANA HERNIA REPAIR Left    2001   IR REMOVAL TUN ACCESS W/ PORT W/O FL MOD SED  02/03/2020   PORTA CATH INSERTION  2011   RIGHT/LEFT HEART CATH AND CORONARY ANGIOGRAPHY N/A 08/27/2021   Procedure: RIGHT/LEFT HEART CATH AND CORONARY ANGIOGRAPHY;  Surgeon: Cherrie Toribio SAUNDERS, MD;  Location: [facility];  Service: Cardiovascular;  Laterality: N/A;   TEMPORARY PACEMAKER N/A 08/26/2021   Procedure: TEMPORARY PACEMAKER;  Surgeon: Wendel Lurena POUR, MD;  Location: [facility];  Service: Cardiovascular;  Laterality: N/A;   TRACHEOSTOMY  03/2017   with decannulation   TRANSMETATARSAL AMPUTATION Left 08/05/2020   Procedure: TRANSMETATARSAL AMPUTATION;  Surgeon: Gershon Donnice SAUNDERS, DPM;  Location: [facility] OR;  Service: Podiatry;  Laterality: Left;     Family History  Problem Relation Age of Onset   Dementia Brother 45       frontal lobe    Dementia Mother    Arthritis Mother    Hypertension Mother    CVA Sister 57   Colon cancer Neg Hx    Pancreatic cancer Neg Hx    Stomach cancer Neg Hx    Rectal cancer Neg Hx    Liver cancer Neg Hx      Social History   Socioeconomic History   Marital status: Divorced    Spouse name: Not on  file   Number of children: Not on file   Years of education: Not on file   Highest education level: Not on file  Occupational History   Occupation: cosmetologist  Tobacco Use   Smoking status: Former    Current packs/day: 0.00    Average packs/day: 0.5 packs/day for 20.0 years (10.0  ttl pk-yrs)    Types: Cigarettes    Start date: 01/10/1989    Quit date: 01/10/2009    Years since quitting: 14.5   Smokeless tobacco: Never  Vaping Use   Vaping status: Never Used  Substance and Sexual Activity   Alcohol  use: Not Currently    Comment: 2-3 times per week   Drug use: No   Sexual activity: Not on file  Other Topics Concern   Not on file  Social History Narrative   Not on file   Social Drivers of Health   Financial Resource Strain: Not on file  Food Insecurity: No Food Insecurity (03/30/2023)   Hunger Vital Sign    Worried About Running Out of Food in the Last Year: Never true    Ran Out of Food in the Last Year: Never true  Transportation Needs: No Transportation Needs (03/30/2023)   PRAPARE - Administrator, Civil Service (Medical): No    Lack of Transportation (Non-Medical): No  Physical Activity: Not on file  Stress: Not on file  Social Connections: Moderately Isolated (03/30/2023)   Social Connection and Isolation Panel    Frequency of Communication with Friends and Family: More than three times a week    Frequency of Social Gatherings with Friends and Family: More than three times a week    Attends Religious Services: More than 4 times per year    Active Member of [employer] or Organizations: No    Attends Banker Meetings: Never    Marital Status: Divorced  Catering manager Violence: Not At Risk (03/30/2023)   Humiliation, Afraid, Rape, and Kick questionnaire    Fear of Current or Ex-Partner: No    Emotionally Abused: No    Physically Abused: No    Sexually Abused: No     BP 96/67   Pulse 72   Ht 5' 11 (1.803 m)   Wt (!) 320 lb (145.2 kg)   SpO2  95%   BMI 44.63 kg/m   Physical Exam:  obese appearing 68 yo man, NAD HEENT: Unremarkable Neck:  No JVD, no thyromegally Lymphatics:  No adenopathy Back:  No CVA tenderness Lungs:  Clear with no wheezes HEART:  IRegular rate rhythm, no murmurs, no rubs, no clicks Abd:  soft, positive bowel sounds, no organomegally, no rebound, no guarding Ext:  2 plus pulses, no edema, no cyanosis, no clubbing Skin:  No rashes no nodules Neuro:  CN II through XII intact, motor grossly intact  DEVICE  Normal device function.  See PaceArt for details. Shock impedence is 137 today.  Assess/Plan: Elevated shocking impedence - I have recommended a period of watchful waiting. We may have to extract the ICD lead and insert a new lead.  Heart block - he is asymptomatic s/p ICD insertion. Chronic atrial fib -his rates are controlled. He is paced in the ventricle. Chronic systolic heart failure - his symptoms remain class 2. We will follow. Obesity - I encouraged the patient to lose weight and try to get under 300 lbs.  Danelle Aidee Latimore,MD

## 2023-07-28 LAB — CUP PACEART INCLINIC DEVICE CHECK
Date Time Interrogation Session: 20250717121500
Implantable Lead Connection Status: 753985
Implantable Lead Connection Status: 753985
Implantable Lead Implant Date: 20230823
Implantable Lead Implant Date: 20230823
Implantable Lead Location: 753860
Implantable Lead Location: 753860
Implantable Lead Model: 3830
Implantable Lead Model: 6935
Implantable Pulse Generator Implant Date: 20230823

## 2023-08-02 ENCOUNTER — Encounter (HOSPITAL_BASED_OUTPATIENT_CLINIC_OR_DEPARTMENT_OTHER): Admitting: Internal Medicine

## 2023-08-02 DIAGNOSIS — I87311 Chronic venous hypertension (idiopathic) with ulcer of right lower extremity: Secondary | ICD-10-CM | POA: Diagnosis not present

## 2023-08-02 DIAGNOSIS — E11621 Type 2 diabetes mellitus with foot ulcer: Secondary | ICD-10-CM | POA: Diagnosis not present

## 2023-08-02 DIAGNOSIS — S90811A Abrasion, right foot, initial encounter: Secondary | ICD-10-CM | POA: Diagnosis not present

## 2023-08-02 DIAGNOSIS — I890 Lymphedema, not elsewhere classified: Secondary | ICD-10-CM | POA: Diagnosis not present

## 2023-08-02 DIAGNOSIS — L97518 Non-pressure chronic ulcer of other part of right foot with other specified severity: Secondary | ICD-10-CM | POA: Diagnosis not present

## 2023-08-02 NOTE — Progress Notes (Signed)
 Remote ICD transmission.

## 2023-08-02 NOTE — Addendum Note (Signed)
 Addended by: TAWNI DRILLING D on: 08/02/2023 10:14 AM   Modules accepted: Orders

## 2023-08-07 ENCOUNTER — Ambulatory Visit (HOSPITAL_COMMUNITY)

## 2023-08-09 ENCOUNTER — Encounter (HOSPITAL_BASED_OUTPATIENT_CLINIC_OR_DEPARTMENT_OTHER): Admitting: Internal Medicine

## 2023-08-09 DIAGNOSIS — L97312 Non-pressure chronic ulcer of right ankle with fat layer exposed: Secondary | ICD-10-CM | POA: Diagnosis not present

## 2023-08-09 DIAGNOSIS — S90811A Abrasion, right foot, initial encounter: Secondary | ICD-10-CM | POA: Diagnosis not present

## 2023-08-09 DIAGNOSIS — I87311 Chronic venous hypertension (idiopathic) with ulcer of right lower extremity: Secondary | ICD-10-CM | POA: Diagnosis not present

## 2023-08-09 DIAGNOSIS — E11621 Type 2 diabetes mellitus with foot ulcer: Secondary | ICD-10-CM | POA: Diagnosis not present

## 2023-08-09 DIAGNOSIS — L97518 Non-pressure chronic ulcer of other part of right foot with other specified severity: Secondary | ICD-10-CM | POA: Diagnosis not present

## 2023-08-09 DIAGNOSIS — I890 Lymphedema, not elsewhere classified: Secondary | ICD-10-CM | POA: Diagnosis not present

## 2023-08-11 ENCOUNTER — Telehealth: Payer: Self-pay

## 2023-08-11 ENCOUNTER — Other Ambulatory Visit: Payer: Self-pay

## 2023-08-11 DIAGNOSIS — J849 Interstitial pulmonary disease, unspecified: Secondary | ICD-10-CM

## 2023-08-11 NOTE — Telephone Encounter (Signed)
 Copied from CRM [id number]. Topic: Clinical - Lab/Test Results >> Aug 11, 2023 12:13 PM Rozanna G wrote: Reason for CRM: PT CALLED TO [facility] HIS PFT THAT WAS CANCELLED BUT NO NEW ORDER SHOWING FOR PFT. THANKS   Spoke with patient NEW order for PFT has been placed and he has been schedule

## 2023-08-14 ENCOUNTER — Ambulatory Visit (HOSPITAL_COMMUNITY)
Admission: RE | Admit: 2023-08-14 | Discharge: 2023-08-14 | Disposition: A | Discharge status: Home / Self Care | Source: Ambulatory Visit | Attending: Surgery | Admitting: Surgery

## 2023-08-14 ENCOUNTER — Encounter (HOSPITAL_COMMUNITY)

## 2023-08-14 ENCOUNTER — Encounter

## 2023-08-14 DIAGNOSIS — I4821 Permanent atrial fibrillation: Secondary | ICD-10-CM | POA: Diagnosis not present

## 2023-08-14 DIAGNOSIS — L97518 Non-pressure chronic ulcer of other part of right foot with other specified severity: Secondary | ICD-10-CM | POA: Insufficient documentation

## 2023-08-15 LAB — VAS US PAD ABI

## 2023-08-16 ENCOUNTER — Encounter (HOSPITAL_BASED_OUTPATIENT_CLINIC_OR_DEPARTMENT_OTHER): Attending: Internal Medicine | Admitting: Internal Medicine

## 2023-08-16 DIAGNOSIS — I890 Lymphedema, not elsewhere classified: Secondary | ICD-10-CM | POA: Insufficient documentation

## 2023-08-16 DIAGNOSIS — E11621 Type 2 diabetes mellitus with foot ulcer: Secondary | ICD-10-CM | POA: Insufficient documentation

## 2023-08-16 DIAGNOSIS — M10071 Idiopathic gout, right ankle and foot: Secondary | ICD-10-CM | POA: Insufficient documentation

## 2023-08-16 DIAGNOSIS — I87311 Chronic venous hypertension (idiopathic) with ulcer of right lower extremity: Secondary | ICD-10-CM | POA: Diagnosis not present

## 2023-08-16 DIAGNOSIS — B9562 Methicillin resistant Staphylococcus aureus infection as the cause of diseases classified elsewhere: Secondary | ICD-10-CM | POA: Insufficient documentation

## 2023-08-16 DIAGNOSIS — L97518 Non-pressure chronic ulcer of other part of right foot with other specified severity: Secondary | ICD-10-CM | POA: Insufficient documentation

## 2023-08-22 ENCOUNTER — Telehealth: Payer: Self-pay

## 2023-08-22 NOTE — Telephone Encounter (Signed)
 Referred to ICM clinic by Caffie Shed, PA at HF clinic following last OV.   Attempted call to patient for ICM intro and left message for return call.  [facility] Optivol shows unstable fluid levels over the last few months.

## 2023-08-23 ENCOUNTER — Encounter (HOSPITAL_BASED_OUTPATIENT_CLINIC_OR_DEPARTMENT_OTHER): Admitting: Internal Medicine

## 2023-08-23 DIAGNOSIS — G4733 Obstructive sleep apnea (adult) (pediatric): Secondary | ICD-10-CM | POA: Diagnosis not present

## 2023-08-23 DIAGNOSIS — S90811A Abrasion, right foot, initial encounter: Secondary | ICD-10-CM | POA: Diagnosis not present

## 2023-08-23 DIAGNOSIS — M10071 Idiopathic gout, right ankle and foot: Secondary | ICD-10-CM | POA: Diagnosis not present

## 2023-08-23 DIAGNOSIS — L97518 Non-pressure chronic ulcer of other part of right foot with other specified severity: Secondary | ICD-10-CM | POA: Diagnosis not present

## 2023-08-23 DIAGNOSIS — B9562 Methicillin resistant Staphylococcus aureus infection as the cause of diseases classified elsewhere: Secondary | ICD-10-CM | POA: Diagnosis not present

## 2023-08-23 DIAGNOSIS — E11621 Type 2 diabetes mellitus with foot ulcer: Secondary | ICD-10-CM | POA: Diagnosis not present

## 2023-08-23 DIAGNOSIS — I890 Lymphedema, not elsewhere classified: Secondary | ICD-10-CM | POA: Diagnosis not present

## 2023-08-23 DIAGNOSIS — L97312 Non-pressure chronic ulcer of right ankle with fat layer exposed: Secondary | ICD-10-CM | POA: Diagnosis not present

## 2023-08-23 DIAGNOSIS — I87311 Chronic venous hypertension (idiopathic) with ulcer of right lower extremity: Secondary | ICD-10-CM | POA: Diagnosis not present

## 2023-08-24 DIAGNOSIS — I83013 Varicose veins of right lower extremity with ulcer of ankle: Secondary | ICD-10-CM | POA: Diagnosis not present

## 2023-08-26 LAB — AEROBIC CULTURE W GRAM STAIN (SUPERFICIAL SPECIMEN): Gram Stain: NONE SEEN

## 2023-08-30 ENCOUNTER — Encounter (HOSPITAL_BASED_OUTPATIENT_CLINIC_OR_DEPARTMENT_OTHER): Admitting: Internal Medicine

## 2023-09-04 ENCOUNTER — Encounter (HOSPITAL_BASED_OUTPATIENT_CLINIC_OR_DEPARTMENT_OTHER): Admitting: Internal Medicine

## 2023-09-04 ENCOUNTER — Encounter (HOSPITAL_COMMUNITY)

## 2023-09-04 DIAGNOSIS — B9562 Methicillin resistant Staphylococcus aureus infection as the cause of diseases classified elsewhere: Secondary | ICD-10-CM | POA: Diagnosis not present

## 2023-09-04 DIAGNOSIS — I87311 Chronic venous hypertension (idiopathic) with ulcer of right lower extremity: Secondary | ICD-10-CM | POA: Diagnosis not present

## 2023-09-04 DIAGNOSIS — L97518 Non-pressure chronic ulcer of other part of right foot with other specified severity: Secondary | ICD-10-CM | POA: Diagnosis not present

## 2023-09-04 DIAGNOSIS — I890 Lymphedema, not elsewhere classified: Secondary | ICD-10-CM | POA: Diagnosis not present

## 2023-09-04 DIAGNOSIS — E11621 Type 2 diabetes mellitus with foot ulcer: Secondary | ICD-10-CM

## 2023-09-04 DIAGNOSIS — M10071 Idiopathic gout, right ankle and foot: Secondary | ICD-10-CM | POA: Diagnosis not present

## 2023-09-05 ENCOUNTER — Encounter

## 2023-09-06 ENCOUNTER — Encounter (HOSPITAL_BASED_OUTPATIENT_CLINIC_OR_DEPARTMENT_OTHER): Admitting: Internal Medicine

## 2023-09-06 ENCOUNTER — Ambulatory Visit (HOSPITAL_BASED_OUTPATIENT_CLINIC_OR_DEPARTMENT_OTHER): Admitting: Internal Medicine

## 2023-09-08 ENCOUNTER — Telehealth: Payer: Self-pay | Admitting: *Deleted

## 2023-09-08 NOTE — Telephone Encounter (Signed)
 Unscheduled remote transmission: scheduled non billable. Normal device function 22 NSVT events, those listed, longest x 2 sec (device defined), V-rates 190s-200 bpm.  All clustered on 7/20 with salvos of AVNRT vs VT with retrograde conduction.  To triage.  Follow up as scheduled [place], CV[facility] __________________________________________________________________________  Attempted to call patient to assess for symptoms. No answer. LMTCB  Routing to CV Device pool for follow up

## 2023-09-13 ENCOUNTER — Ambulatory Visit

## 2023-09-13 ENCOUNTER — Encounter (HOSPITAL_BASED_OUTPATIENT_CLINIC_OR_DEPARTMENT_OTHER): Attending: Internal Medicine | Admitting: Internal Medicine

## 2023-09-13 DIAGNOSIS — X58XXXA Exposure to other specified factors, initial encounter: Secondary | ICD-10-CM | POA: Diagnosis not present

## 2023-09-13 DIAGNOSIS — S90811A Abrasion, right foot, initial encounter: Secondary | ICD-10-CM | POA: Insufficient documentation

## 2023-09-13 DIAGNOSIS — I890 Lymphedema, not elsewhere classified: Secondary | ICD-10-CM | POA: Diagnosis not present

## 2023-09-13 DIAGNOSIS — I87311 Chronic venous hypertension (idiopathic) with ulcer of right lower extremity: Secondary | ICD-10-CM | POA: Insufficient documentation

## 2023-09-13 DIAGNOSIS — L97518 Non-pressure chronic ulcer of other part of right foot with other specified severity: Secondary | ICD-10-CM | POA: Insufficient documentation

## 2023-09-13 DIAGNOSIS — M10071 Idiopathic gout, right ankle and foot: Secondary | ICD-10-CM | POA: Diagnosis not present

## 2023-09-13 DIAGNOSIS — E11621 Type 2 diabetes mellitus with foot ulcer: Secondary | ICD-10-CM | POA: Diagnosis not present

## 2023-09-14 NOTE — Telephone Encounter (Signed)
Attempted to contact patient. No answer, left message to call back

## 2023-09-14 NOTE — Telephone Encounter (Signed)
 Alert received for episodes of NSVT on July 30, 2023.  Episodes were brief in nature but frequent from 7:57 to 8:17 am.  Pt is dependent.  He has a dual chamber pacemaker, but both wires are in the RV.  He has a history of VT with appropriate therapy.  Spoke with Pt.  He states morning of episodes his air conditioner had broken.  He states he was getting ready for church and was very warm with perfuse sweating.  He denies any symptoms other than being too hot.  He states he is taking all medications as ordered.  Will forward to physician for review and advisement.  Anticipate continued monitoring d/t asymptomatic.

## 2023-09-14 NOTE — Telephone Encounter (Signed)
 Attempted ICM Referral call and no answer.  Unable to reach patient to enroll in [facility].

## 2023-09-18 ENCOUNTER — Ambulatory Visit: Payer: Medicare Other

## 2023-09-18 ENCOUNTER — Encounter (HOSPITAL_COMMUNITY)

## 2023-09-18 ENCOUNTER — Encounter

## 2023-09-18 DIAGNOSIS — I442 Atrioventricular block, complete: Secondary | ICD-10-CM

## 2023-09-19 LAB — CUP PACEART REMOTE DEVICE CHECK
Battery Remaining Longevity: 64 mo
Battery Voltage: 2.99 V
Brady Statistic AP VP Percent: 99.72 %
Brady Statistic AP VS Percent: 0 %
Brady Statistic AS VP Percent: 0.11 %
Brady Statistic AS VS Percent: 0.17 %
Brady Statistic RA Percent Paced: 98.73 %
Brady Statistic RV Percent Paced: 98.88 %
Date Time Interrogation Session: 20250908022507
HighPow Impedance: 102 Ohm
Implantable Lead Connection Status: 753985
Implantable Lead Connection Status: 753985
Implantable Lead Implant Date: 20230823
Implantable Lead Implant Date: 20230823
Implantable Lead Location: 753860
Implantable Lead Location: 753860
Implantable Lead Model: 3830
Implantable Lead Model: 6935
Implantable Pulse Generator Implant Date: 20230823
Lead Channel Impedance Value: 304 Ohm
Lead Channel Impedance Value: 437 Ohm
Lead Channel Impedance Value: 437 Ohm
Lead Channel Sensing Intrinsic Amplitude: 15 mV
Lead Channel Sensing Intrinsic Amplitude: 15 mV
Lead Channel Sensing Intrinsic Amplitude: 15.25 mV
Lead Channel Sensing Intrinsic Amplitude: 15.25 mV
Lead Channel Setting Pacing Amplitude: 1.5 V
Lead Channel Setting Pacing Amplitude: 2.5 V
Lead Channel Setting Pacing Pulse Width: 0.4 ms
Lead Channel Setting Sensing Sensitivity: 0.3 mV
Zone Setting Status: 755011

## 2023-09-20 ENCOUNTER — Encounter (HOSPITAL_BASED_OUTPATIENT_CLINIC_OR_DEPARTMENT_OTHER): Admitting: Internal Medicine

## 2023-09-20 DIAGNOSIS — L97518 Non-pressure chronic ulcer of other part of right foot with other specified severity: Secondary | ICD-10-CM

## 2023-09-20 DIAGNOSIS — S90811A Abrasion, right foot, initial encounter: Secondary | ICD-10-CM

## 2023-09-20 DIAGNOSIS — I87311 Chronic venous hypertension (idiopathic) with ulcer of right lower extremity: Secondary | ICD-10-CM

## 2023-09-20 DIAGNOSIS — X58XXXA Exposure to other specified factors, initial encounter: Secondary | ICD-10-CM | POA: Diagnosis not present

## 2023-09-20 DIAGNOSIS — E11621 Type 2 diabetes mellitus with foot ulcer: Secondary | ICD-10-CM

## 2023-09-20 DIAGNOSIS — I890 Lymphedema, not elsewhere classified: Secondary | ICD-10-CM | POA: Diagnosis not present

## 2023-09-20 DIAGNOSIS — M10071 Idiopathic gout, right ankle and foot: Secondary | ICD-10-CM | POA: Diagnosis not present

## 2023-09-22 ENCOUNTER — Telehealth (HOSPITAL_COMMUNITY): Payer: Self-pay

## 2023-09-22 ENCOUNTER — Other Ambulatory Visit (HOSPITAL_COMMUNITY): Payer: Self-pay | Admitting: Internal Medicine

## 2023-09-22 NOTE — Telephone Encounter (Signed)
 Called to confirm/remind patient of their appointment at the [facility] on 09/25/23 2:00.   Appointment:   [x] Confirmed  [] Left mess   [] No answer/No voice mail  [] VM Full/unable to leave message  [] Phone not in service  Patient reminded to bring all medications and/or complete list.  Confirmed patient has transportation. Gave directions, instructed to utilize valet parking.

## 2023-09-25 ENCOUNTER — Ambulatory Visit (HOSPITAL_COMMUNITY)
Admission: RE | Admit: 2023-09-25 | Discharge: 2023-09-25 | Disposition: A | Discharge status: Home / Self Care | Source: Ambulatory Visit | Attending: Cardiology

## 2023-09-25 ENCOUNTER — Ambulatory Visit (HOSPITAL_COMMUNITY): Payer: Self-pay | Admitting: Cardiology

## 2023-09-25 ENCOUNTER — Encounter (HOSPITAL_COMMUNITY): Payer: Self-pay

## 2023-09-25 VITALS — BP 130/82 | HR 73 | Ht 71.0 in | Wt 334.0 lb

## 2023-09-25 DIAGNOSIS — I2510 Atherosclerotic heart disease of native coronary artery without angina pectoris: Secondary | ICD-10-CM | POA: Insufficient documentation

## 2023-09-25 DIAGNOSIS — I472 Ventricular tachycardia, unspecified: Secondary | ICD-10-CM | POA: Diagnosis not present

## 2023-09-25 DIAGNOSIS — Z79899 Other long term (current) drug therapy: Secondary | ICD-10-CM | POA: Insufficient documentation

## 2023-09-25 DIAGNOSIS — Z7985 Long-term (current) use of injectable non-insulin antidiabetic drugs: Secondary | ICD-10-CM | POA: Diagnosis not present

## 2023-09-25 DIAGNOSIS — I5022 Chronic systolic (congestive) heart failure: Secondary | ICD-10-CM | POA: Diagnosis not present

## 2023-09-25 DIAGNOSIS — G4733 Obstructive sleep apnea (adult) (pediatric): Secondary | ICD-10-CM | POA: Diagnosis not present

## 2023-09-25 DIAGNOSIS — Z9221 Personal history of antineoplastic chemotherapy: Secondary | ICD-10-CM | POA: Diagnosis not present

## 2023-09-25 DIAGNOSIS — Z8572 Personal history of non-Hodgkin lymphomas: Secondary | ICD-10-CM | POA: Diagnosis not present

## 2023-09-25 DIAGNOSIS — I4821 Permanent atrial fibrillation: Secondary | ICD-10-CM

## 2023-09-25 DIAGNOSIS — I4729 Other ventricular tachycardia: Secondary | ICD-10-CM | POA: Diagnosis not present

## 2023-09-25 DIAGNOSIS — I5082 Biventricular heart failure: Secondary | ICD-10-CM | POA: Insufficient documentation

## 2023-09-25 DIAGNOSIS — I739 Peripheral vascular disease, unspecified: Secondary | ICD-10-CM

## 2023-09-25 DIAGNOSIS — E1151 Type 2 diabetes mellitus with diabetic peripheral angiopathy without gangrene: Secondary | ICD-10-CM | POA: Diagnosis not present

## 2023-09-25 DIAGNOSIS — Z8616 Personal history of COVID-19: Secondary | ICD-10-CM | POA: Diagnosis not present

## 2023-09-25 DIAGNOSIS — Z7901 Long term (current) use of anticoagulants: Secondary | ICD-10-CM | POA: Insufficient documentation

## 2023-09-25 DIAGNOSIS — Z8249 Family history of ischemic heart disease and other diseases of the circulatory system: Secondary | ICD-10-CM | POA: Insufficient documentation

## 2023-09-25 DIAGNOSIS — E785 Hyperlipidemia, unspecified: Secondary | ICD-10-CM | POA: Diagnosis not present

## 2023-09-25 DIAGNOSIS — I110 Hypertensive heart disease with heart failure: Secondary | ICD-10-CM | POA: Diagnosis not present

## 2023-09-25 DIAGNOSIS — I878 Other specified disorders of veins: Secondary | ICD-10-CM | POA: Insufficient documentation

## 2023-09-25 DIAGNOSIS — I480 Paroxysmal atrial fibrillation: Secondary | ICD-10-CM | POA: Insufficient documentation

## 2023-09-25 DIAGNOSIS — Z7984 Long term (current) use of oral hypoglycemic drugs: Secondary | ICD-10-CM | POA: Insufficient documentation

## 2023-09-25 DIAGNOSIS — Z6841 Body Mass Index (BMI) 40.0 and over, adult: Secondary | ICD-10-CM | POA: Insufficient documentation

## 2023-09-25 DIAGNOSIS — Z95810 Presence of automatic (implantable) cardiac defibrillator: Secondary | ICD-10-CM | POA: Insufficient documentation

## 2023-09-25 DIAGNOSIS — J849 Interstitial pulmonary disease, unspecified: Secondary | ICD-10-CM | POA: Diagnosis not present

## 2023-09-25 DIAGNOSIS — Z87891 Personal history of nicotine dependence: Secondary | ICD-10-CM | POA: Diagnosis not present

## 2023-09-25 DIAGNOSIS — I4430 Unspecified atrioventricular block: Secondary | ICD-10-CM | POA: Insufficient documentation

## 2023-09-25 DIAGNOSIS — E66813 Obesity, class 3: Secondary | ICD-10-CM

## 2023-09-25 LAB — BASIC METABOLIC PANEL WITH GFR
Anion gap: 13 (ref 5–15)
BUN: 26 mg/dL — ABNORMAL HIGH (ref 8–23)
CO2: 23 mmol/L (ref 22–32)
Calcium: 9.4 mg/dL (ref 8.9–10.3)
Chloride: 97 mmol/L — ABNORMAL LOW (ref 98–111)
Creatinine, Ser: 1.05 mg/dL (ref 0.61–1.24)
GFR, Estimated: >60 mL/min (ref >60)
Glucose, Bld: 98 mg/dL (ref 70–99)
Potassium: 4 mmol/L (ref 3.5–5.1)
Sodium: 133 mmol/L — ABNORMAL LOW (ref 135–145)

## 2023-09-25 LAB — BRAIN NATRIURETIC PEPTIDE: B Natriuretic Peptide: 204.3 pg/mL — ABNORMAL HIGH (ref 0.0–100.0)

## 2023-09-25 MED ORDER — ALLOPURINOL 300 MG PO TABS: 300.0000 mg | ORAL_TABLET | Freq: Every day | ORAL | 3 refills | Status: DC

## 2023-09-25 MED ORDER — APIXABAN 5 MG PO TABS: 5.0000 mg | ORAL_TABLET | Freq: Two times a day (BID) | ORAL | 1 refills | Status: AC

## 2023-09-25 MED ORDER — PREDNISONE 20 MG PO TABS: 40.0000 mg | ORAL_TABLET | Freq: Every day | ORAL | 0 refills | Status: DC

## 2023-09-25 MED ORDER — METOLAZONE 2.5 MG PO TABS: 2.5000 mg | ORAL_TABLET | ORAL | 1 refills | Status: DC

## 2023-09-25 NOTE — Progress Notes (Signed)
 -  ADVANCED HF CLINIC NOTE  PCP: Loreli Elsie JONETTA Mickey., MD HF Cardiologist: Dr. Cherrie  HPI: [name] is a 68 y.o.y/o male w/ h/o chronic systolic heart failure, chronic afib, OSA, HTN, HLD, type 2DM, OSA on CPAP, ILD, tobacco use and venous stasis ulcers, followed by wound clinic. Also history of chemotherapy with potential cardiotoxic effects (R-CHOP for lymphoma in 2011).    Admitted in 02/2017 for acute hypoxic respiratory failure requiring intubation and ultimately tracheostomy. This was in the setting of multifocal PNA/ influenza and new onset atrial fibrillation w/ RVR. Echo showed mod LVH and severely reduced LVEF 25-30% w/ diffuse HK. RV normal. At the time, his CM was felt to be viral. Did not get cath. Per d/c summary, DCCV not pursued. Afib treated w/ rate control and Eliquis .    Repeat echo 07/2017 EF improved to 50-55%, RV mildly HK. He was noted to be in afib at the time of study.    08/2017 Zio showed Afib, well rated controlled.    Echo 11/2018 EF 45-50%, RV mildly reduced.    Unfortunately, lost to f/u by cardiology for a period of time. Had been seeing [facility] for PAD, s/p left transmetatarsal amputation.  Echo 8/23 showed EF 30-35%, severe LVH, moderately reduced RV systolic function, moderate TVR.    Admitted 8/23 with pre-syncope, ECG showed AF w/ SVR, HR 28 BPM, RBBB -> developed worsening periods of high-grade AV block and lengthy pauses w/ hypotension. Underwent TVP with improvement in symptoms. R/LHC showed diffuse nonobstructive CAD, bi-V failure R>L, mild to moderate pulmonary venous hypertension. Underwent BiV ICD. Hospitalization complicated by chronic venous stasis wounds.  Echo 01/04/22: EF 30% RV moderately down   Follow up 04/11/22, massive volume overload, with NYHA III symptoms. Only taking torsemide  on weekends. Restarted torsemide  40 bid x 3 days, then down to 40 mg daily.  Volume overloaded (15-18 lbs) at [facility] 6/25. Torsemide  was increase to 60 and weekly  metolazone  was added. Per his Optivol, his his he had extensive fluid accumulation that improved with these changes. Per device report fluid had been controlled for 2 months on this regimen. Now again s/s of fluid with rise on Optivol above threshold.   He returns today for heart failure follow up. Overall feeling ok, has been struggling with shortness of breath with exertion and edema in his lower extremities. He missed his metolazone  last week, as he had run out. Has also missed a few doses of Eliquis , due to pharmacy not filling this script. Has noticed that his urine response to his diuretics is not as significant as before. NYHA III. Reports dyspnea and fatigue. Denies chest pain, palpitations, dizziness, and abnormal bleeding. Able to perform ADLs. Appetite okay.  Cardiac Studies - Echo (3/25):  EF 25-30%, mod LVH - Echo (12/23): EF 30%, RV moderately down - PYP (10/23): equivocal grade 1, H/CL = 1.06 - Echo (8/23): EF 30-35%, moderately decreased LV function, RV moderate reduced, moderate TR - R/LHC (8/23): Mid RCA lesion is 40% stenosed. RPDA lesion is 95% stenosed. Ost LM lesion is 30% stenosed. Mid LAD lesion is 30% stenosed.  2nd Diag lesion is 50% stenosed.  Mid Cx lesion is 30% stenosed. Ao = 109/58 (78), LV = 119/19, RA =  21, RV = 57/19, PA = 56/22 (36), PCW = 24 (v = 30), Fick cardiac output/index = 7.3/2.7, PVR = 1.8 WU, Ao sat = 99%, PA sat = 68%, 69%, PAPi = 2. 1. Mostly non-obstructive CAD with high-grade  lesion in mid to distal RPDA 2. Nonischemic CM 3. Mild to moderate pulmonary venous HTN with evidence of RV dysfunction. Continue medical therapy. If patient develops angina can consider PCI of PDA; otherwise medical therapy.   Past Medical History:  Diagnosis Date   Acute systolic HF (heart failure) (HCC)    Arthritis    lt foot, hips knees    Atrial fibrillation (HCC)    Diabetes mellitus, new onset (HCC)    Diverticulosis    Dyslipidemia    Gout attack 01/16/2012    Hypertension    Internal hemorrhoids    Lymphoma (HCC)    remission for about 2 years, chemo 3 years prior   Pneumonia due to COVID-19 virus 07/2018   Pulmonary hypertension (HCC)    Sepsis (HCC) 02/2017   secondary to influenza; requiring trach   Tubular adenoma of colon    Venous stasis ulcers (HCC)     Current Outpatient Medications  Medication Sig Dispense Refill   acetaminophen  (TYLENOL ) 325 MG tablet Take 650 mg by mouth every 6 (six) hours as needed for moderate pain.      allopurinol  (ZYLOPRIM ) 300 MG tablet Take 1 tablet (300 mg total) by mouth daily. 30 tablet 3   ascorbic acid  (VITAMIN C ) 500 MG tablet Take 500 mg by mouth 2 (two) times daily.     atorvastatin  (LIPITOR) 40 MG tablet TAKE 1 TABLET BY MOUTH EVERY DAY 30 tablet 11   carvedilol  (COREG ) 3.125 MG tablet Take 1 tablet (3.125 mg total) by mouth 2 (two) times daily. NEEDS FOLLOW UP APPOINTMENT FOR MORE REFILLS 180 tablet 2   Cholecalciferol  (VITAMIN D3) 1000 units CAPS Take 1 capsule by mouth daily.     colchicine  0.6 MG tablet Take 0.6 mg by mouth 2 (two) times daily as needed. (Patient taking differently: Take 0.6 mg by mouth 3 (three) times daily as needed.)     [facility] ASPIRIN  ADULT LOW DOSE 81 MG chewable tablet CHEW 1 TABLET BY MOUTH DAILY. 108 tablet 3   dapagliflozin  propanediol (FARXIGA ) 10 MG TABS tablet Take 1 tablet (10 mg total) by mouth daily. 90 tablet 1   Multiple Vitamins-Minerals (CENTRUM ADULTS PO) Take 1 tablet by mouth daily.     Omega 3 340 MG CPDR Take 1 capsule (340 mg total) by mouth 2 (two) times daily. 60 capsule 0   omeprazole  (PRILOSEC) 40 MG capsule Take 1 capsule (40 mg total) by mouth daily. 30 capsule 0   potassium chloride  SA (KLOR-CON  M) 20 MEQ tablet Take 2 tablets (40 mEq total) by mouth daily. Take extra 2 tabs every Monday with metolazone  70 tablet 3   sacubitril -valsartan  (ENTRESTO ) 24-26 MG Take 1 tablet by mouth 2 (two) times daily. 60 tablet 3   sertraline  (ZOLOFT ) 50 MG tablet  Take 1 tablet (50 mg total) by mouth daily. 30 tablet 0   spironolactone  (ALDACTONE ) 25 MG tablet Take 12.5 mg by mouth daily.     torsemide  (DEMADEX ) 20 MG tablet Take 4 tablets (80 mg total) by mouth daily. 120 tablet 2   vitamin B-12 (CYANOCOBALAMIN ) 1000 MCG tablet Take 1 tablet (1,000 mcg total) by mouth daily. 30 tablet 0   ELIQUIS  5 MG TABS tablet TAKE 1 TABLET (5 MG TOTAL) BY MOUTH 2 (TWO) TIMES DAILY. NEEDS FOLLOW UP APPOINTMENT (Patient not taking: Reported on 09/25/2023) 180 tablet 1   metolazone  (ZAROXOLYN ) 2.5 MG tablet Take 1 tablet (2.5 mg total) by mouth once a week. Every Monday 12 tablet 1  predniSONE  (DELTASONE ) 20 MG tablet Take 2 tablets (40 mg total) by mouth daily with breakfast. FOR 3 DAYS 6 tablet 0   No current facility-administered medications for this encounter.   Allergies  Allergen Reactions   Latex Hives and Swelling   Ace Inhibitors Other (See Comments)    Other reaction(s): cough   Penicillin G Hives and Rash    Has tolerated amoxicillin    Social History   Socioeconomic History   Marital status: Divorced    Spouse name: Not on file   Number of children: Not on file   Years of education: Not on file   Highest education level: Not on file  Occupational History   Occupation: cosmetologist  Tobacco Use   Smoking status: Former    Current packs/day: 0.00    Average packs/day: 0.5 packs/day for 20.0 years (10.0 ttl pk-yrs)    Types: Cigarettes    Start date: 01/10/1989    Quit date: 01/10/2009    Years since quitting: 14.7   Smokeless tobacco: Never  Vaping Use   Vaping status: Never Used  Substance and Sexual Activity   Alcohol  use: Not Currently    Comment: 2-3 times per week   Drug use: No   Sexual activity: Not on file  Other Topics Concern   Not on file  Social History Narrative   Not on file   Social Drivers of Health   Financial Resource Strain: Not on file  Food Insecurity: No Food Insecurity (03/30/2023)   Hunger Vital Sign     Worried About Running Out of Food in the Last Year: Never true    Ran Out of Food in the Last Year: Never true  Transportation Needs: No Transportation Needs (03/30/2023)   PRAPARE - Administrator, Civil Service (Medical): No    Lack of Transportation (Non-Medical): No  Physical Activity: Not on file  Stress: Not on file  Social Connections: Moderately Isolated (03/30/2023)   Social Connection and Isolation Panel    Frequency of Communication with Friends and Family: More than three times a week    Frequency of Social Gatherings with Friends and Family: More than three times a week    Attends Religious Services: More than 4 times per year    Active Member of [employer] or Organizations: No    Attends Banker Meetings: Never    Marital Status: Divorced  Catering manager Violence: Not At Risk (03/30/2023)   Humiliation, Afraid, Rape, and Kick questionnaire    Fear of Current or Ex-Partner: No    Emotionally Abused: No    Physically Abused: No    Sexually Abused: No   Family History  Problem Relation Age of Onset   Dementia Brother 27       frontal lobe    Dementia Mother    Arthritis Mother    Hypertension Mother    CVA Sister 16   Colon cancer Neg Hx    Pancreatic cancer Neg Hx    Stomach cancer Neg Hx    Rectal cancer Neg Hx    Liver cancer Neg Hx    BP 130/82   Pulse 73   Ht 5' 11 (1.803 m)   Wt (!) 151.5 kg (334 lb)   SpO2 96%   BMI 46.58 kg/m   Wt Readings from Last 3 Encounters:  09/25/23 (!) 151.5 kg (334 lb)  07/27/23 (!) 145.2 kg (320 lb)  07/10/23 (!) 152.9 kg (337 lb)   PHYSICAL EXAM: General:  Well appearing. No distress on RA Cardiac: JVP ~10cm. S1 and S2 present. No murmurs  Resp: Lung sounds clear and equal B/L Extremities: Warm and dry.  3+ edema. R wrist swollen  Neuro: Alert and oriented x3. Affect pleasant.   MDT device interrogation (personally reviewed): 0% AF burden, 0.9 h/d activity, 0 VT, 99.4 BiV pacing, 22 NSVT events,  Optivol showing fluid accumulation over the last 2 weeks.    ASSESSMENT & PLAN:  Chronic Systolic Biventricular Heart Failure - Echo (2/19): EF 25-30% w/ diffuse HK. RV normal. Felt to be viral CM +/- component of tachymediated in setting of influenza PNA and rapid Afib  - Echo (7/19): EF recovered, 50-55%, RV mildly reduced - Echo (8/23): EF 30-35%, severe LVH, moderately reduced RV systolic function, moderate TR - R/LHC (8/23): diffuse non-obstructive CAD with 90% lesion in dLAD, BiV failure R>L: RA 21 PA 56/22 (36) PCW 24 Fick 7.3/2.7 PaPi 2.1 - Myeloma panel and urine immunofixation negative.  - PYP scan 10/23 equivocal. Gene testing 11/23 negative  - Echo (3/25): EF 25-30%, mod LVH - s/p MDT CRT-D  - NYHA Class II-III, stable and confounded by obesity and deconditioning  - Hypervolemic on exam and by Optivol. Continue Torsemide  80 mg daily.  - Take metolazone  2.5 mg daily x2 days then return to weekly on Monday. Take with additional 40 mEq KCL - Will have device RN send a new Optivol read in 7 days  - Continue spironolactone  to 25 mg daily  - Continue Farxiga  10 mg daily  - Continue carvedilol  3.125 mg bid  - Continue Entresto  24/26 mg bid  2. Presyncopal/Syncopal episode - had episode 5/25. Device interrogation showed no correlating arrhthymias  - no further recurrence   3. Profound Bradycardia w/ High-grade AV block - s/p BiV ICD. - Interrogation as above   4. Paroxysmal Afib - Dates back to at least 2019, has been historically well rate controlled. - no AF detection on device interrogation  - Continue CPAP - Continue Eliquis .    5. NSVT - 22 events noted on device interrogation 09/18/23 since 07/27/23 - suspect related to volume overload - continue coreg   - will have device RN send interrogation in 1 week   6. PAD/ Chronic Venous Stasis Wounds  - s/p left transmetatarsal amputation - Followed by [facility] and Wound clinic - Diuresis as above, wearing compression  socks  7. H/o Lymphoma - s/p R-CHOP in 2011 (potential cardiotoxic effects)    8. Type 2DM - Hgb A1c 6.0 - On SGLT2i    9. CAD - LHC (8/23) diffuse mostly non-obstructive CAD, not cause of CM. - If has angina, can consider PCI PDA - No chest pain - Continue statin. Off ASA with Eliquis    10. Morbid obesity - Body mass index is 46.58 kg/m. - insurance did not cover Mounjaro. Did not tolerate Ozempic   11. ILD - High res CT nonspecific, but ? NSIP vs chronic hypersensitivity pneumonitis - PFTs 2024 with moderate restrictive disease not changed by bronchodilators - Followed by Pulmonary  Follow up in 4 weeks with APP (assess volume + NSVT per device report)  [place] Hendel Gatliff, NP  2:56 PM

## 2023-09-25 NOTE — Patient Instructions (Signed)
 Medication Changes:  RESTART ALLOPURINOL  300MG  ONCE DAILY   TAKE PREDNISONE  40MG  FOR 3 DAYS ONLY   TAKE METOLAZONE  TOMORROW AND WEDNESDAY, WITH EXTRA POTASSIUM ( )   Lab Work:  Labs done today, your results will be available in MyChart, we will contact you for abnormal readings.  THEN RETURN FOR LABS AS SCHEDULED IN 1 WEEK   Follow-Up in: 1 MONTH AS SCHEDULED   At the [facility], you and your health needs are our priority. We have a designated team specialized in the treatment of Heart Failure. This Care Team includes your primary Heart Failure Specialized Cardiologist (physician), Advanced Practice Providers (APPs- Physician Assistants and Nurse Practitioners), and Pharmacist who all work together to provide you with the care you need, when you need it.   You may see any of the following providers on your designated Care Team at your next follow up:  Dr. Toribio Fuel Dr. Ezra Shuck Dr. Ria Commander Dr. Odis Brownie Greig Mosses, NP Caffie Shed, [place] [facility] [place], [place] Beckey Coe, NP [place] Lee, NP Tinnie Redman, PharmD   Please be sure to bring in all your medications bottles to every appointment.   Need to Contact Us :  If you have any questions or concerns before your next appointment please send us  a message through [place] or call our office at [phone].    TO LEAVE A MESSAGE FOR THE NURSE SELECT OPTION 2, PLEASE LEAVE A MESSAGE INCLUDING: YOUR NAME DATE OF BIRTH CALL BACK NUMBER REASON FOR CALL**this is important as we prioritize the call backs  YOU WILL RECEIVE A CALL BACK THE SAME DAY AS LONG AS YOU CALL BEFORE 4:00 PM

## 2023-09-25 NOTE — Addendum Note (Signed)
 Encounter addended by: Aidenn Skellenger B, RN on: 09/25/2023 3:43 PM  Actions taken: Visit diagnoses modified, Pharmacy for encounter modified, Order list changed, Diagnosis association updated

## 2023-09-26 ENCOUNTER — Ambulatory Visit: Payer: Self-pay | Admitting: Internal Medicine

## 2023-09-27 ENCOUNTER — Encounter (HOSPITAL_BASED_OUTPATIENT_CLINIC_OR_DEPARTMENT_OTHER): Admitting: Internal Medicine

## 2023-09-27 DIAGNOSIS — I890 Lymphedema, not elsewhere classified: Secondary | ICD-10-CM | POA: Diagnosis not present

## 2023-09-27 DIAGNOSIS — I87311 Chronic venous hypertension (idiopathic) with ulcer of right lower extremity: Secondary | ICD-10-CM | POA: Diagnosis not present

## 2023-09-27 DIAGNOSIS — E11621 Type 2 diabetes mellitus with foot ulcer: Secondary | ICD-10-CM | POA: Diagnosis not present

## 2023-09-27 DIAGNOSIS — L97518 Non-pressure chronic ulcer of other part of right foot with other specified severity: Secondary | ICD-10-CM

## 2023-09-27 DIAGNOSIS — S90811A Abrasion, right foot, initial encounter: Secondary | ICD-10-CM

## 2023-09-27 DIAGNOSIS — X58XXXA Exposure to other specified factors, initial encounter: Secondary | ICD-10-CM | POA: Diagnosis not present

## 2023-09-27 DIAGNOSIS — M10071 Idiopathic gout, right ankle and foot: Secondary | ICD-10-CM | POA: Diagnosis not present

## 2023-09-28 NOTE — Progress Notes (Signed)
Remote ICD Transmission.

## 2023-10-02 ENCOUNTER — Encounter

## 2023-10-03 ENCOUNTER — Encounter (HOSPITAL_COMMUNITY): Payer: Self-pay | Admitting: *Deleted

## 2023-10-03 NOTE — Progress Notes (Signed)
 Physician's Statement for claim form completed and signed by Dr Cherrie, pt aware and will pick up at front desk

## 2023-10-04 ENCOUNTER — Encounter (HOSPITAL_BASED_OUTPATIENT_CLINIC_OR_DEPARTMENT_OTHER): Admitting: Internal Medicine

## 2023-10-04 DIAGNOSIS — L97518 Non-pressure chronic ulcer of other part of right foot with other specified severity: Secondary | ICD-10-CM | POA: Diagnosis not present

## 2023-10-04 DIAGNOSIS — E11621 Type 2 diabetes mellitus with foot ulcer: Secondary | ICD-10-CM

## 2023-10-04 DIAGNOSIS — S90811A Abrasion, right foot, initial encounter: Secondary | ICD-10-CM | POA: Diagnosis not present

## 2023-10-04 DIAGNOSIS — I87311 Chronic venous hypertension (idiopathic) with ulcer of right lower extremity: Secondary | ICD-10-CM | POA: Diagnosis not present

## 2023-10-09 ENCOUNTER — Ambulatory Visit (HOSPITAL_COMMUNITY)
Admission: RE | Admit: 2023-10-09 | Discharge: 2023-10-09 | Disposition: A | Discharge status: Home / Self Care | Source: Ambulatory Visit | Attending: Cardiology | Admitting: Cardiology

## 2023-10-09 DIAGNOSIS — I5022 Chronic systolic (congestive) heart failure: Secondary | ICD-10-CM

## 2023-10-09 NOTE — Telephone Encounter (Signed)
 If symptoms continue we can uptitrate the coreg .

## 2023-10-09 NOTE — Telephone Encounter (Signed)
 Looks like very brief NSVT. No change in treatment. If symptoms persist we can uptitrate his coreg .

## 2023-10-11 ENCOUNTER — Encounter (HOSPITAL_BASED_OUTPATIENT_CLINIC_OR_DEPARTMENT_OTHER): Attending: Internal Medicine | Admitting: Internal Medicine

## 2023-10-11 DIAGNOSIS — L97518 Non-pressure chronic ulcer of other part of right foot with other specified severity: Secondary | ICD-10-CM | POA: Insufficient documentation

## 2023-10-11 DIAGNOSIS — M10071 Idiopathic gout, right ankle and foot: Secondary | ICD-10-CM | POA: Diagnosis not present

## 2023-10-11 DIAGNOSIS — I890 Lymphedema, not elsewhere classified: Secondary | ICD-10-CM | POA: Diagnosis not present

## 2023-10-11 DIAGNOSIS — S90811A Abrasion, right foot, initial encounter: Secondary | ICD-10-CM | POA: Insufficient documentation

## 2023-10-11 DIAGNOSIS — E11621 Type 2 diabetes mellitus with foot ulcer: Secondary | ICD-10-CM | POA: Diagnosis not present

## 2023-10-11 DIAGNOSIS — I87331 Chronic venous hypertension (idiopathic) with ulcer and inflammation of right lower extremity: Secondary | ICD-10-CM | POA: Diagnosis not present

## 2023-10-11 DIAGNOSIS — I87311 Chronic venous hypertension (idiopathic) with ulcer of right lower extremity: Secondary | ICD-10-CM

## 2023-10-11 DIAGNOSIS — S90822A Blister (nonthermal), left foot, initial encounter: Secondary | ICD-10-CM | POA: Insufficient documentation

## 2023-10-18 ENCOUNTER — Encounter (HOSPITAL_BASED_OUTPATIENT_CLINIC_OR_DEPARTMENT_OTHER): Admitting: Internal Medicine

## 2023-10-18 DIAGNOSIS — S90811A Abrasion, right foot, initial encounter: Secondary | ICD-10-CM

## 2023-10-18 DIAGNOSIS — S90822A Blister (nonthermal), left foot, initial encounter: Secondary | ICD-10-CM | POA: Diagnosis not present

## 2023-10-18 DIAGNOSIS — I87331 Chronic venous hypertension (idiopathic) with ulcer and inflammation of right lower extremity: Secondary | ICD-10-CM | POA: Diagnosis not present

## 2023-10-18 DIAGNOSIS — I890 Lymphedema, not elsewhere classified: Secondary | ICD-10-CM | POA: Diagnosis not present

## 2023-10-18 DIAGNOSIS — I87311 Chronic venous hypertension (idiopathic) with ulcer of right lower extremity: Secondary | ICD-10-CM

## 2023-10-18 DIAGNOSIS — E11621 Type 2 diabetes mellitus with foot ulcer: Secondary | ICD-10-CM | POA: Diagnosis not present

## 2023-10-18 DIAGNOSIS — M10071 Idiopathic gout, right ankle and foot: Secondary | ICD-10-CM | POA: Diagnosis not present

## 2023-10-18 DIAGNOSIS — L97518 Non-pressure chronic ulcer of other part of right foot with other specified severity: Secondary | ICD-10-CM | POA: Diagnosis not present

## 2023-10-25 ENCOUNTER — Encounter (HOSPITAL_BASED_OUTPATIENT_CLINIC_OR_DEPARTMENT_OTHER): Admitting: Internal Medicine

## 2023-10-25 DIAGNOSIS — S90811A Abrasion, right foot, initial encounter: Secondary | ICD-10-CM

## 2023-10-25 DIAGNOSIS — E11621 Type 2 diabetes mellitus with foot ulcer: Secondary | ICD-10-CM

## 2023-10-25 DIAGNOSIS — I87311 Chronic venous hypertension (idiopathic) with ulcer of right lower extremity: Secondary | ICD-10-CM

## 2023-10-25 DIAGNOSIS — L97518 Non-pressure chronic ulcer of other part of right foot with other specified severity: Secondary | ICD-10-CM | POA: Diagnosis not present

## 2023-10-30 ENCOUNTER — Encounter (HOSPITAL_COMMUNITY)

## 2023-11-01 ENCOUNTER — Other Ambulatory Visit (HOSPITAL_COMMUNITY): Payer: Self-pay | Admitting: Cardiology

## 2023-11-01 ENCOUNTER — Encounter (HOSPITAL_BASED_OUTPATIENT_CLINIC_OR_DEPARTMENT_OTHER): Admitting: Internal Medicine

## 2023-11-01 DIAGNOSIS — I87311 Chronic venous hypertension (idiopathic) with ulcer of right lower extremity: Secondary | ICD-10-CM

## 2023-11-01 DIAGNOSIS — L97518 Non-pressure chronic ulcer of other part of right foot with other specified severity: Secondary | ICD-10-CM

## 2023-11-01 DIAGNOSIS — E11621 Type 2 diabetes mellitus with foot ulcer: Secondary | ICD-10-CM

## 2023-11-01 DIAGNOSIS — I890 Lymphedema, not elsewhere classified: Secondary | ICD-10-CM | POA: Diagnosis not present

## 2023-11-01 DIAGNOSIS — S90811A Abrasion, right foot, initial encounter: Secondary | ICD-10-CM

## 2023-11-01 DIAGNOSIS — I87331 Chronic venous hypertension (idiopathic) with ulcer and inflammation of right lower extremity: Secondary | ICD-10-CM | POA: Diagnosis not present

## 2023-11-01 DIAGNOSIS — M10071 Idiopathic gout, right ankle and foot: Secondary | ICD-10-CM | POA: Diagnosis not present

## 2023-11-01 DIAGNOSIS — R001 Bradycardia, unspecified: Secondary | ICD-10-CM

## 2023-11-06 DIAGNOSIS — H5213 Myopia, bilateral: Secondary | ICD-10-CM | POA: Diagnosis not present

## 2023-11-08 ENCOUNTER — Encounter (HOSPITAL_BASED_OUTPATIENT_CLINIC_OR_DEPARTMENT_OTHER): Admitting: Internal Medicine

## 2023-11-08 DIAGNOSIS — L97518 Non-pressure chronic ulcer of other part of right foot with other specified severity: Secondary | ICD-10-CM | POA: Diagnosis not present

## 2023-11-08 DIAGNOSIS — E11621 Type 2 diabetes mellitus with foot ulcer: Secondary | ICD-10-CM | POA: Diagnosis not present

## 2023-11-08 DIAGNOSIS — S90811A Abrasion, right foot, initial encounter: Secondary | ICD-10-CM | POA: Diagnosis not present

## 2023-11-08 DIAGNOSIS — I87311 Chronic venous hypertension (idiopathic) with ulcer of right lower extremity: Secondary | ICD-10-CM | POA: Diagnosis not present

## 2023-11-08 DIAGNOSIS — I87331 Chronic venous hypertension (idiopathic) with ulcer and inflammation of right lower extremity: Secondary | ICD-10-CM | POA: Diagnosis not present

## 2023-11-08 DIAGNOSIS — M10071 Idiopathic gout, right ankle and foot: Secondary | ICD-10-CM | POA: Diagnosis not present

## 2023-11-08 DIAGNOSIS — I890 Lymphedema, not elsewhere classified: Secondary | ICD-10-CM | POA: Diagnosis not present

## 2023-11-10 ENCOUNTER — Telehealth (HOSPITAL_COMMUNITY): Payer: Self-pay

## 2023-11-10 NOTE — Telephone Encounter (Signed)
 Called to confirm/remind patient of their appointment at the [facility] on 11/13/23.   Appointment:   [x] Confirmed  [] Left mess   [] No answer/No voice mail  [] VM Full/unable to leave message  [] Phone not in service  Patient reminded to bring all medications and/or complete list.  Confirmed patient has transportation. Gave directions, instructed to utilize valet parking.

## 2023-11-13 ENCOUNTER — Other Ambulatory Visit (HOSPITAL_COMMUNITY): Payer: Self-pay

## 2023-11-13 ENCOUNTER — Other Ambulatory Visit: Payer: Self-pay

## 2023-11-13 ENCOUNTER — Ambulatory Visit (HOSPITAL_COMMUNITY)
Admission: RE | Admit: 2023-11-13 | Discharge: 2023-11-13 | Disposition: A | Discharge status: Home / Self Care | Source: Ambulatory Visit | Attending: Family Medicine | Admitting: Family Medicine

## 2023-11-13 ENCOUNTER — Telehealth (HOSPITAL_COMMUNITY): Payer: Self-pay

## 2023-11-13 ENCOUNTER — Encounter: Payer: Self-pay | Admitting: Internal Medicine

## 2023-11-13 ENCOUNTER — Encounter (HOSPITAL_COMMUNITY): Payer: Self-pay

## 2023-11-13 VITALS — BP 126/80 | HR 88 | Wt 337.4 lb

## 2023-11-13 DIAGNOSIS — L97509 Non-pressure chronic ulcer of other part of unspecified foot with unspecified severity: Secondary | ICD-10-CM

## 2023-11-13 DIAGNOSIS — I472 Ventricular tachycardia, unspecified: Secondary | ICD-10-CM | POA: Insufficient documentation

## 2023-11-13 DIAGNOSIS — I4510 Unspecified right bundle-branch block: Secondary | ICD-10-CM | POA: Diagnosis not present

## 2023-11-13 DIAGNOSIS — Z6841 Body Mass Index (BMI) 40.0 and over, adult: Secondary | ICD-10-CM | POA: Insufficient documentation

## 2023-11-13 DIAGNOSIS — I739 Peripheral vascular disease, unspecified: Secondary | ICD-10-CM

## 2023-11-13 DIAGNOSIS — I480 Paroxysmal atrial fibrillation: Secondary | ICD-10-CM | POA: Diagnosis not present

## 2023-11-13 DIAGNOSIS — E785 Hyperlipidemia, unspecified: Secondary | ICD-10-CM | POA: Insufficient documentation

## 2023-11-13 DIAGNOSIS — Z87891 Personal history of nicotine dependence: Secondary | ICD-10-CM | POA: Diagnosis not present

## 2023-11-13 DIAGNOSIS — I2510 Atherosclerotic heart disease of native coronary artery without angina pectoris: Secondary | ICD-10-CM | POA: Diagnosis not present

## 2023-11-13 DIAGNOSIS — Z79899 Other long term (current) drug therapy: Secondary | ICD-10-CM | POA: Insufficient documentation

## 2023-11-13 DIAGNOSIS — J849 Interstitial pulmonary disease, unspecified: Secondary | ICD-10-CM | POA: Insufficient documentation

## 2023-11-13 DIAGNOSIS — G4733 Obstructive sleep apnea (adult) (pediatric): Secondary | ICD-10-CM | POA: Diagnosis not present

## 2023-11-13 DIAGNOSIS — E11621 Type 2 diabetes mellitus with foot ulcer: Secondary | ICD-10-CM

## 2023-11-13 DIAGNOSIS — Z8572 Personal history of non-Hodgkin lymphomas: Secondary | ICD-10-CM | POA: Insufficient documentation

## 2023-11-13 DIAGNOSIS — Z7901 Long term (current) use of anticoagulants: Secondary | ICD-10-CM | POA: Diagnosis not present

## 2023-11-13 DIAGNOSIS — Z9221 Personal history of antineoplastic chemotherapy: Secondary | ICD-10-CM | POA: Insufficient documentation

## 2023-11-13 DIAGNOSIS — I5022 Chronic systolic (congestive) heart failure: Secondary | ICD-10-CM | POA: Diagnosis not present

## 2023-11-13 DIAGNOSIS — I4729 Other ventricular tachycardia: Secondary | ICD-10-CM | POA: Diagnosis not present

## 2023-11-13 DIAGNOSIS — I878 Other specified disorders of veins: Secondary | ICD-10-CM | POA: Insufficient documentation

## 2023-11-13 DIAGNOSIS — I5082 Biventricular heart failure: Secondary | ICD-10-CM | POA: Insufficient documentation

## 2023-11-13 DIAGNOSIS — Z7984 Long term (current) use of oral hypoglycemic drugs: Secondary | ICD-10-CM | POA: Diagnosis not present

## 2023-11-13 DIAGNOSIS — I110 Hypertensive heart disease with heart failure: Secondary | ICD-10-CM | POA: Insufficient documentation

## 2023-11-13 DIAGNOSIS — Z91040 Latex allergy status: Secondary | ICD-10-CM | POA: Insufficient documentation

## 2023-11-13 DIAGNOSIS — R001 Bradycardia, unspecified: Secondary | ICD-10-CM

## 2023-11-13 DIAGNOSIS — I4430 Unspecified atrioventricular block: Secondary | ICD-10-CM | POA: Insufficient documentation

## 2023-11-13 LAB — BASIC METABOLIC PANEL WITH GFR
Anion gap: 14 (ref 5–15)
BUN: 38 mg/dL — ABNORMAL HIGH (ref 8–23)
CO2: 28 mmol/L (ref 22–32)
Calcium: 9.3 mg/dL (ref 8.9–10.3)
Chloride: 92 mmol/L — ABNORMAL LOW (ref 98–111)
Creatinine, Ser: 1.04 mg/dL (ref 0.61–1.24)
GFR, Estimated: >60 mL/min (ref >60)
Glucose, Bld: 110 mg/dL — ABNORMAL HIGH (ref 70–99)
Potassium: 3.3 mmol/L — ABNORMAL LOW (ref 3.5–5.1)
Sodium: 134 mmol/L — ABNORMAL LOW (ref 135–145)

## 2023-11-13 LAB — BRAIN NATRIURETIC PEPTIDE: B Natriuretic Peptide: 229.4 pg/mL — ABNORMAL HIGH (ref 0.0–100.0)

## 2023-11-13 MED ORDER — POTASSIUM CHLORIDE CRYS ER 20 MEQ PO TBCR: 40.0000 meq | EXTENDED_RELEASE_TABLET | Freq: Every day | ORAL | 3 refills | Status: DC

## 2023-11-13 MED ORDER — FUROSCIX 80 MG/10ML ~~LOC~~ CTKT: 80.0000 mg | CARTRIDGE | Freq: Every day | SUBCUTANEOUS | 0 refills | Status: DC | PRN

## 2023-11-13 MED ORDER — FUROSCIX 80 MG/10ML ~~LOC~~ CTKT
80.0000 mg | CARTRIDGE | Freq: Every day | SUBCUTANEOUS | 0 refills | Status: AC | PRN
  Filled 2023-11-13: qty 10, 10d supply, fill #0

## 2023-11-13 NOTE — Patient Instructions (Signed)
 Medication Changes:  Your provider has order Furoscix  for you. This is an on-body infuser that gives you a dose of Furosemide .   It will be shipped to your home from [facility], they will call you before shipping  Ensure you write down the time you start your infusion so that if there is a problem you will know how long the infusion lasted  Use Furoscix  only AS DIRECTED by our office  Dosing Directions:   Day 1= TUESDAY-- USE FUROSCIX  WITH OF POTASSIUM   Day 2= WEDNESDAY--USE FUROSCIX  WITH OF POTASSIUM   Day 3= THURSDAY--USE FUROSCIX  WITH OF POTASSIUM  Day 4= FRIDAY-- RESTART TORSEMIDE  80MG  ONCE DIALY WITH OF POTASSIUM    Lab Work:  Labs done today, your results will be available in MyChart, we will contact you for abnormal readings.  Follow-Up in: 1 WEEK AS SCHEDULED WITH APP CLINIC   At the [facility], you and your health needs are our priority. We have a designated team specialized in the treatment of Heart Failure. This Care Team includes your primary Heart Failure Specialized Cardiologist (physician), Advanced Practice Providers (APPs- Physician Assistants and Nurse Practitioners), and Pharmacist who all work together to provide you with the care you need, when you need it.   You may see any of the following providers on your designated Care Team at your next follow up:  Dr. Toribio Fuel Dr. Ezra Shuck Dr. Odis Brownie Greig Mosses, NP Caffie Shed, [place] [facility] [place], [place] Beckey Coe, NP Jordan Lee, NP Tinnie Redman, PharmD   Please be sure to bring in all your medications bottles to every appointment.   Need to Contact Us :  If you have any questions or concerns before your next appointment please send us  a message through [place] or call our office at [phone].    TO LEAVE A MESSAGE FOR THE NURSE SELECT OPTION 2, PLEASE LEAVE A MESSAGE INCLUDING: YOUR NAME DATE OF  BIRTH CALL BACK NUMBER REASON FOR CALL**this is important as we prioritize the call backs  YOU WILL RECEIVE A CALL BACK THE SAME DAY AS LONG AS YOU CALL BEFORE 4:00 PM

## 2023-11-13 NOTE — Progress Notes (Signed)
 Provided patient education on Furoscix  using demo kits and Furoscix  video, QR code provided on AVS for further viewing. Furoscix  order submitted online, ov note and ins card uploaded to Furoscix  Direct.  Medication Samples have been provided to the patient.  Drug name: Furoscix        Strength: 80mg         Qty: 2 boxes  LOT: [id number]  Exp.Date: 12/09/2024  Dosing instructions: only as directed by HF clinic  The patient has been instructed regarding the correct time, dose, and frequency of taking this medication, including desired effects and most common side effects.   Kesley Gaffey B Estil Vallee 4:07 PM 11/13/2023;e

## 2023-11-13 NOTE — Progress Notes (Signed)
 -  ADVANCED HF CLINIC NOTE  PCP: Loreli Elsie JONETTA Mickey., MD HF Cardiologist: Dr. Cherrie  HPI: [name] is a 68 y.o.y/o male w/ h/o chronic systolic heart failure, chronic afib, OSA, HTN, HLD, type 2DM, OSA on CPAP, ILD, tobacco use and venous stasis ulcers, followed by wound clinic. Also history of chemotherapy with potential cardiotoxic effects (R-CHOP for lymphoma in 2011).    Admitted in 02/2017 for acute hypoxic respiratory failure requiring intubation and ultimately tracheostomy. This was in the setting of multifocal PNA/ influenza and new onset atrial fibrillation w/ RVR. Echo showed mod LVH and severely reduced LVEF 25-30% w/ diffuse HK. RV normal. At the time, his CM was felt to be viral. Did not get cath. Per d/c summary, DCCV not pursued. Afib treated w/ rate control and Eliquis .    Repeat echo 07/2017 EF improved to 50-55%, RV mildly HK. He was noted to be in afib at the time of study.    08/2017 Zio showed Afib, well rated controlled.    Echo 11/2018 EF 45-50%, RV mildly reduced.    Unfortunately, lost to f/u by cardiology for a period of time. Had been seeing [facility] for PAD, s/p left transmetatarsal amputation.  Echo 8/23 showed EF 30-35%, severe LVH, moderately reduced RV systolic function, moderate TVR.    Admitted 8/23 with pre-syncope, ECG showed AF w/ SVR, HR 28 BPM, RBBB -> developed worsening periods of high-grade AV block and lengthy pauses w/ hypotension. Underwent TVP with improvement in symptoms. R/LHC showed diffuse nonobstructive CAD, bi-V failure R>L, mild to moderate pulmonary venous hypertension. Underwent BiV ICD. Hospitalization complicated by chronic venous stasis wounds.  Echo 01/04/22: EF 30% RV moderately down   Follow up 04/11/22, massive volume overload, with NYHA III symptoms. Only taking torsemide  on weekends. Restarted torsemide  40 bid x 3 days, then down to 40 mg daily.  Follow up 6/25, massive volume overloaded. Torsemide  increased to 60 daily,  metolazone  weekly added.  Today he returns for HF follow up with his friend. Overall feeling full of fluid. He is SOB with minimal movement. Not urinating much on torsemide  or weekly metolazone . Has swelling in legs. Appetite ok. Denies palpitations, abnormal bleeding, CP, dizziness, or PND/Orthopnea. Appetite ok. Weight at home 237 pounds. Taking all medications. Sees Wound Clinic weekly for RLE wound, has wound vac. Wears BiPap.  Cardiac Studies - Echo (3/25):  EF 25-30%, mod LVH - Echo (12/23): EF 30%, RV moderately down - PYP (10/23): equivocal grade 1, H/CL = 1.06 - Echo (8/23): EF 30-35%, moderately decreased LV function, RV moderate reduced, moderate TR - R/LHC (8/23): Mid RCA lesion is 40% stenosed. RPDA lesion is 95% stenosed. Ost LM lesion is 30% stenosed. Mid LAD lesion is 30% stenosed.  2nd Diag lesion is 50% stenosed.  Mid Cx lesion is 30% stenosed. Ao = 109/58 (78), LV = 119/19, RA =  21, RV = 57/19, PA = 56/22 (36), PCW = 24 (v = 30), Fick cardiac output/index = 7.3/2.7, PVR = 1.8 WU, Ao sat = 99%, PA sat = 68%, 69%, PAPi = 2. 1. Mostly non-obstructive CAD with high-grade lesion in mid to distal RPDA 2. Nonischemic CM 3. Mild to moderate pulmonary venous HTN with evidence of RV dysfunction. Continue medical therapy. If patient develops angina can consider PCI of PDA; otherwise medical therapy.   Past Medical History:  Diagnosis Date   Acute systolic HF (heart failure) (HCC)    Arthritis    lt foot, hips knees  Atrial fibrillation (HCC)    Diabetes mellitus, new onset (HCC)    Diverticulosis    Dyslipidemia    Gout attack 01/16/2012   Hypertension    Internal hemorrhoids    Lymphoma (HCC)    remission for about 2 years, chemo 3 years prior   Pneumonia due to COVID-19 virus 07/2018   Pulmonary hypertension (HCC)    Sepsis (HCC) 02/2017   secondary to influenza; requiring trach   Tubular adenoma of colon    Venous stasis ulcers (HCC)     Current Outpatient Medications   Medication Sig Dispense Refill   acetaminophen  (TYLENOL ) 325 MG tablet Take 650 mg by mouth every 6 (six) hours as needed for moderate pain.      allopurinol  (ZYLOPRIM ) 300 MG tablet Take 1 tablet (300 mg total) by mouth daily. 30 tablet 3   apixaban  (ELIQUIS ) 5 MG TABS tablet Take 1 tablet (5 mg total) by mouth 2 (two) times daily. 180 tablet 1   ascorbic acid  (VITAMIN C ) 500 MG tablet Take 500 mg by mouth 2 (two) times daily.     atorvastatin  (LIPITOR) 40 MG tablet TAKE 1 TABLET BY MOUTH EVERY DAY 30 tablet 11   carvedilol  (COREG ) 3.125 MG tablet Take 1 tablet (3.125 mg total) by mouth 2 (two) times daily. NEEDS FOLLOW UP APPOINTMENT FOR MORE REFILLS 180 tablet 2   Cholecalciferol  (VITAMIN D3) 1000 units CAPS Take 1 capsule by mouth daily.     colchicine  0.6 MG tablet Take 0.6 mg by mouth 2 (two) times daily as needed.     [facility] ASPIRIN  ADULT LOW DOSE 81 MG chewable tablet CHEW 1 TABLET BY MOUTH DAILY. 108 tablet 3   dapagliflozin  propanediol (FARXIGA ) 10 MG TABS tablet Take 1 tablet (10 mg total) by mouth daily. 90 tablet 1   metolazone  (ZAROXOLYN ) 2.5 MG tablet Take 1 tablet (2.5 mg total) by mouth once a week. Every Monday 12 tablet 1   Multiple Vitamins-Minerals (CENTRUM ADULTS PO) Take 1 tablet by mouth daily.     Omega 3 340 MG CPDR Take 1 capsule (340 mg total) by mouth 2 (two) times daily. 60 capsule 0   omeprazole  (PRILOSEC) 40 MG capsule Take 1 capsule (40 mg total) by mouth daily. 30 capsule 0   potassium chloride  SA (KLOR-CON  M) 20 MEQ tablet Take 2 tablets (40 mEq total) by mouth daily. Take extra 2 tabs every Monday with metolazone  70 tablet 3   predniSONE  (DELTASONE ) 20 MG tablet Take 2 tablets (40 mg total) by mouth daily with breakfast. FOR 3 DAYS 6 tablet 0   sacubitril -valsartan  (ENTRESTO ) 24-26 MG Take 1 tablet by mouth 2 (two) times daily. 60 tablet 3   sertraline  (ZOLOFT ) 50 MG tablet Take 1 tablet (50 mg total) by mouth daily. 30 tablet 0   spironolactone  (ALDACTONE ) 25 MG  tablet Take 12.5 mg by mouth daily.     torsemide  (DEMADEX ) 20 MG tablet Take 4 tablets (80 mg total) by mouth daily. 120 tablet 2   vitamin B-12 (CYANOCOBALAMIN ) 1000 MCG tablet Take 1 tablet (1,000 mcg total) by mouth daily. 30 tablet 0   No current facility-administered medications for this encounter.   Allergies  Allergen Reactions   Latex Hives and Swelling   Ace Inhibitors Other (See Comments)    Other reaction(s): cough   Penicillin G Hives and Rash    Has tolerated amoxicillin    Social History   Socioeconomic History   Marital status: Divorced    Spouse name:  Not on file   Number of children: Not on file   Years of education: Not on file   Highest education level: Not on file  Occupational History   Occupation: cosmetologist  Tobacco Use   Smoking status: Former    Current packs/day: 0.00    Average packs/day: 0.5 packs/day for 20.0 years (10.0 ttl pk-yrs)    Types: Cigarettes    Start date: 01/10/1989    Quit date: 01/10/2009    Years since quitting: 14.8   Smokeless tobacco: Never  Vaping Use   Vaping status: Never Used  Substance and Sexual Activity   Alcohol  use: Not Currently    Comment: 2-3 times per week   Drug use: No   Sexual activity: Not on file  Other Topics Concern   Not on file  Social History Narrative   Not on file   Social Drivers of Health   Financial Resource Strain: Not on file  Food Insecurity: No Food Insecurity (03/30/2023)   Hunger Vital Sign    Worried About Running Out of Food in the Last Year: Never true    Ran Out of Food in the Last Year: Never true  Transportation Needs: No Transportation Needs (03/30/2023)   PRAPARE - Administrator, Civil Service (Medical): No    Lack of Transportation (Non-Medical): No  Physical Activity: Not on file  Stress: Not on file  Social Connections: Moderately Isolated (03/30/2023)   Social Connection and Isolation Panel    Frequency of Communication with Friends and Family: More than  three times a week    Frequency of Social Gatherings with Friends and Family: More than three times a week    Attends Religious Services: More than 4 times per year    Active Member of [employer] or Organizations: No    Attends Banker Meetings: Never    Marital Status: Divorced  Catering Manager Violence: Not At Risk (03/30/2023)   Humiliation, Afraid, Rape, and Kick questionnaire    Fear of Current or Ex-Partner: No    Emotionally Abused: No    Physically Abused: No    Sexually Abused: No   Family History  Problem Relation Age of Onset   Dementia Brother 24       frontal lobe    Dementia Mother    Arthritis Mother    Hypertension Mother    CVA Sister 25   Colon cancer Neg Hx    Pancreatic cancer Neg Hx    Stomach cancer Neg Hx    Rectal cancer Neg Hx    Liver cancer Neg Hx    BP 126/80   Pulse 88   Wt (!) 153 kg (337 lb 6.4 oz)   SpO2 94%   BMI 47.06 kg/m   Wt Readings from Last 3 Encounters:  11/13/23 (!) 153 kg (337 lb 6.4 oz)  09/25/23 (!) 151.5 kg (334 lb)  07/27/23 (!) 145.2 kg (320 lb)   PHYSICAL EXAM: General:  NAD. No resp difficulty, walked into clinic with RW HEENT: Normal Neck: Supple. JVP 10-12, thick neck Cor: Regular rate & rhythm. No rubs, gallops or murmurs. Lungs: Clear Abdomen: Obese, nontender, nondistended.  Extremities: No cyanosis, clubbing, rash, 1-2+ BLE edema Neuro: Alert & oriented x 3, moves all 4 extremities w/o difficulty. Affect pleasant.  Device interrogation (personally reviewed): OptiVol up, thoracic impedence down, 0.9 hr/day of activity, no AT/AF, 1 episode of NSVT, 96.4% BiV pacing   ASSESSMENT & PLAN:  Chronic Systolic Biventricular Heart  Failure - Echo (2/19): EF 25-30% w/ diffuse HK. RV normal. Felt to be viral CM +/- component of tachymediated in setting of influenza PNA and rapid Afib  - Echo (7/19): EF recovered, 50-55%, RV mildly reduced - Echo (8/23): EF 30-35%, severe LVH, moderately reduced RV systolic  function, moderate TR - R/LHC (8/23): diffuse non-obstructive CAD with 90% lesion in dLAD, BiV failure R>L: RA 21 PA 56/22 (36) PCW 24 Fick 7.3/2.7 PaPi 2.1 - Myeloma panel and urine immunofixation negative.  - PYP scan 10/23 equivocal. Gene testing 11/23 negative  - Echo (3/25): EF 25-30%, mod LVH - s/p MDT CRT-D  - NYHA IIIb, volume up on exam and device, weight up - Use Furoscix  + 40 KCL daily x 3 days, hold torsemide  while using Furoscix  - After 3 days, resume torsemide  60 mg daily + 40 KCL daily. - Continue metolazone  2.5 mg + extra 40 KCL on Mondays. - Continue spironolactone  25 mg daily.  - Continue Farxiga  10 mg daily.  - Continue carvedilol  3.125 mg bid.  - Continue Entresto  24/26 mg bid. - Labs today. Repeat labs at close follow up.  2. Profound Bradycardia w/ High-grade AV block - s/p BiV ICD. - Interrogation as above   3. Paroxysmal Afib - Dates back to at least 2019, has been historically well rate controlled. - no AF detection on device interrogation  - Continue CPAP - Continue Eliquis  5 mg bid. No bleeding issues   4. NSVT - 1 event on device interrogation - Continue coreg   - Continue diuresis - Labs today.  5. PAD/ Chronic Venous Stasis Wounds  - s/p left transmetatarsal amputation - Followed by [facility] and Wound clinic  6. H/o Lymphoma - s/p R-CHOP in 2011 (potential cardiotoxic effects)    7. Type 2DM - Hgb A1c 5.8 - On SGLT2i    8. CAD - LHC (8/23) diffuse mostly non-obstructive CAD, not cause of CM. - If has angina, can consider PCI PDA - No chest pain - Continue statin.  - Off ASA with Eliquis    9. Morbid obesity - Body mass index is 47.06 kg/m. - Insurance did not cover Mounjaro. Did not tolerate Ozempic   10. ILD - High res CT nonspecific, but ? NSIP vs chronic hypersensitivity pneumonitis - PFTs 2024 with moderate restrictive disease not changed by bronchodilators - Followed by Pulmonary  Follow up in 1 week with APP (will need BMET and  ECG). He is high risk for re-admission.  Harlene CHRISTELLA Gainer, FNP  3:43 PM

## 2023-11-14 ENCOUNTER — Encounter (HOSPITAL_COMMUNITY): Payer: Self-pay

## 2023-11-14 ENCOUNTER — Other Ambulatory Visit (HOSPITAL_COMMUNITY): Payer: Self-pay

## 2023-11-14 ENCOUNTER — Ambulatory Visit (HOSPITAL_COMMUNITY): Payer: Self-pay | Admitting: Family Medicine

## 2023-11-14 ENCOUNTER — Other Ambulatory Visit: Payer: Self-pay

## 2023-11-14 NOTE — Progress Notes (Signed)
 Specialty Pharmacy Initial Fill Coordination Note  [name] is a 68 y.o. male contacted today regarding initial fill of specialty medication(s) Furosemide  (Furoscix )   Patient requested [name] at [facility] at [place] date: 11/14/23   Medication will be filled on: 11/14/23    Patient is aware of $0 copayment.   Disenrolling as this is a one time fill.

## 2023-11-14 NOTE — Telephone Encounter (Signed)
 Advanced Heart Failure Patient Advocate Encounter  Prior authorization for Furoscix  has been submitted and approved. Test billing returns $0 for 90 day supply.  Key: [id number] Effective: 11/13/2023 to 11/12/2024  Rachel DEL, CPhT Rx Patient Advocate Phone: [phone]

## 2023-11-15 ENCOUNTER — Encounter (HOSPITAL_BASED_OUTPATIENT_CLINIC_OR_DEPARTMENT_OTHER): Attending: Internal Medicine | Admitting: Internal Medicine

## 2023-11-15 ENCOUNTER — Other Ambulatory Visit (HOSPITAL_COMMUNITY): Payer: Self-pay

## 2023-11-15 ENCOUNTER — Other Ambulatory Visit: Payer: Self-pay

## 2023-11-15 DIAGNOSIS — L97518 Non-pressure chronic ulcer of other part of right foot with other specified severity: Secondary | ICD-10-CM | POA: Diagnosis not present

## 2023-11-15 DIAGNOSIS — S90811A Abrasion, right foot, initial encounter: Secondary | ICD-10-CM | POA: Insufficient documentation

## 2023-11-15 DIAGNOSIS — I890 Lymphedema, not elsewhere classified: Secondary | ICD-10-CM | POA: Insufficient documentation

## 2023-11-15 DIAGNOSIS — M10071 Idiopathic gout, right ankle and foot: Secondary | ICD-10-CM | POA: Insufficient documentation

## 2023-11-15 DIAGNOSIS — E11621 Type 2 diabetes mellitus with foot ulcer: Secondary | ICD-10-CM | POA: Diagnosis not present

## 2023-11-15 DIAGNOSIS — I87311 Chronic venous hypertension (idiopathic) with ulcer of right lower extremity: Secondary | ICD-10-CM | POA: Insufficient documentation

## 2023-11-15 DIAGNOSIS — S90822A Blister (nonthermal), left foot, initial encounter: Secondary | ICD-10-CM | POA: Insufficient documentation

## 2023-11-15 DIAGNOSIS — X58XXXA Exposure to other specified factors, initial encounter: Secondary | ICD-10-CM | POA: Diagnosis not present

## 2023-11-16 MED ORDER — POTASSIUM CHLORIDE CRYS ER 20 MEQ PO TBCR: 60.0000 meq | EXTENDED_RELEASE_TABLET | Freq: Every day | ORAL | 3 refills | Status: DC

## 2023-11-16 NOTE — Addendum Note (Signed)
 Addended by: Alexsandra Shontz, DALTON HERO on: 11/16/2023 02:00 PM   Modules accepted: Orders

## 2023-11-22 ENCOUNTER — Encounter (HOSPITAL_BASED_OUTPATIENT_CLINIC_OR_DEPARTMENT_OTHER): Admitting: Internal Medicine

## 2023-11-22 DIAGNOSIS — S90811A Abrasion, right foot, initial encounter: Secondary | ICD-10-CM

## 2023-11-22 DIAGNOSIS — E11621 Type 2 diabetes mellitus with foot ulcer: Secondary | ICD-10-CM | POA: Diagnosis not present

## 2023-11-22 DIAGNOSIS — L97518 Non-pressure chronic ulcer of other part of right foot with other specified severity: Secondary | ICD-10-CM | POA: Diagnosis not present

## 2023-11-22 DIAGNOSIS — S90822A Blister (nonthermal), left foot, initial encounter: Secondary | ICD-10-CM | POA: Diagnosis not present

## 2023-11-22 DIAGNOSIS — I87311 Chronic venous hypertension (idiopathic) with ulcer of right lower extremity: Secondary | ICD-10-CM | POA: Diagnosis not present

## 2023-11-22 DIAGNOSIS — M10071 Idiopathic gout, right ankle and foot: Secondary | ICD-10-CM | POA: Diagnosis not present

## 2023-11-22 DIAGNOSIS — I890 Lymphedema, not elsewhere classified: Secondary | ICD-10-CM | POA: Diagnosis not present

## 2023-11-22 DIAGNOSIS — X58XXXA Exposure to other specified factors, initial encounter: Secondary | ICD-10-CM | POA: Diagnosis not present

## 2023-11-23 ENCOUNTER — Telehealth (HOSPITAL_COMMUNITY): Payer: Self-pay

## 2023-11-23 NOTE — Telephone Encounter (Signed)
 Called to confirm/remind patient of their appointment at the [facility] on 11/24/23.   Appointment:   [x] Confirmed  [] Left mess   [] No answer/No voice mail  [] VM Full/unable to leave message  [] Phone not in service  Patient reminded to bring all medications and/or complete list.  Confirmed patient has transportation. Gave directions, instructed to utilize valet parking.

## 2023-11-24 ENCOUNTER — Encounter (HOSPITAL_COMMUNITY): Payer: Self-pay

## 2023-11-24 ENCOUNTER — Ambulatory Visit (HOSPITAL_COMMUNITY)
Admission: RE | Admit: 2023-11-24 | Discharge: 2023-11-24 | Disposition: A | Discharge status: Home / Self Care | Source: Ambulatory Visit | Attending: Cardiology | Admitting: Cardiology

## 2023-11-24 DIAGNOSIS — Z89432 Acquired absence of left foot: Secondary | ICD-10-CM | POA: Diagnosis not present

## 2023-11-24 DIAGNOSIS — Z7901 Long term (current) use of anticoagulants: Secondary | ICD-10-CM | POA: Diagnosis not present

## 2023-11-24 DIAGNOSIS — E785 Hyperlipidemia, unspecified: Secondary | ICD-10-CM | POA: Insufficient documentation

## 2023-11-24 DIAGNOSIS — Z9221 Personal history of antineoplastic chemotherapy: Secondary | ICD-10-CM | POA: Insufficient documentation

## 2023-11-24 DIAGNOSIS — Z6841 Body Mass Index (BMI) 40.0 and over, adult: Secondary | ICD-10-CM | POA: Insufficient documentation

## 2023-11-24 DIAGNOSIS — I4729 Other ventricular tachycardia: Secondary | ICD-10-CM | POA: Insufficient documentation

## 2023-11-24 DIAGNOSIS — Z7982 Long term (current) use of aspirin: Secondary | ICD-10-CM | POA: Insufficient documentation

## 2023-11-24 DIAGNOSIS — Z79899 Other long term (current) drug therapy: Secondary | ICD-10-CM | POA: Diagnosis not present

## 2023-11-24 DIAGNOSIS — M109 Gout, unspecified: Secondary | ICD-10-CM | POA: Insufficient documentation

## 2023-11-24 DIAGNOSIS — I480 Paroxysmal atrial fibrillation: Secondary | ICD-10-CM | POA: Diagnosis not present

## 2023-11-24 DIAGNOSIS — I5082 Biventricular heart failure: Secondary | ICD-10-CM | POA: Insufficient documentation

## 2023-11-24 DIAGNOSIS — I2510 Atherosclerotic heart disease of native coronary artery without angina pectoris: Secondary | ICD-10-CM | POA: Insufficient documentation

## 2023-11-24 DIAGNOSIS — G4733 Obstructive sleep apnea (adult) (pediatric): Secondary | ICD-10-CM | POA: Insufficient documentation

## 2023-11-24 DIAGNOSIS — I872 Venous insufficiency (chronic) (peripheral): Secondary | ICD-10-CM | POA: Insufficient documentation

## 2023-11-24 DIAGNOSIS — Z8572 Personal history of non-Hodgkin lymphomas: Secondary | ICD-10-CM | POA: Diagnosis not present

## 2023-11-24 DIAGNOSIS — L97528 Non-pressure chronic ulcer of other part of left foot with other specified severity: Secondary | ICD-10-CM | POA: Diagnosis not present

## 2023-11-24 DIAGNOSIS — R001 Bradycardia, unspecified: Secondary | ICD-10-CM

## 2023-11-24 DIAGNOSIS — E1151 Type 2 diabetes mellitus with diabetic peripheral angiopathy without gangrene: Secondary | ICD-10-CM | POA: Diagnosis not present

## 2023-11-24 DIAGNOSIS — J849 Interstitial pulmonary disease, unspecified: Secondary | ICD-10-CM | POA: Insufficient documentation

## 2023-11-24 DIAGNOSIS — I5022 Chronic systolic (congestive) heart failure: Secondary | ICD-10-CM | POA: Diagnosis not present

## 2023-11-24 DIAGNOSIS — Z7984 Long term (current) use of oral hypoglycemic drugs: Secondary | ICD-10-CM | POA: Diagnosis not present

## 2023-11-24 DIAGNOSIS — I482 Chronic atrial fibrillation, unspecified: Secondary | ICD-10-CM | POA: Diagnosis not present

## 2023-11-24 DIAGNOSIS — I110 Hypertensive heart disease with heart failure: Secondary | ICD-10-CM | POA: Insufficient documentation

## 2023-11-24 DIAGNOSIS — E11621 Type 2 diabetes mellitus with foot ulcer: Secondary | ICD-10-CM

## 2023-11-24 DIAGNOSIS — L97509 Non-pressure chronic ulcer of other part of unspecified foot with unspecified severity: Secondary | ICD-10-CM

## 2023-11-24 LAB — BASIC METABOLIC PANEL WITH GFR
Anion gap: 12 (ref 5–15)
BUN: 36 mg/dL — ABNORMAL HIGH (ref 8–23)
CO2: 26 mmol/L (ref 22–32)
Calcium: 9.5 mg/dL (ref 8.9–10.3)
Chloride: 96 mmol/L — ABNORMAL LOW (ref 98–111)
Creatinine, Ser: 1.32 mg/dL — ABNORMAL HIGH (ref 0.61–1.24)
GFR, Estimated: 59 mL/min — ABNORMAL LOW (ref >60)
Glucose, Bld: 115 mg/dL — ABNORMAL HIGH (ref 70–99)
Potassium: 4.6 mmol/L (ref 3.5–5.1)
Sodium: 134 mmol/L — ABNORMAL LOW (ref 135–145)

## 2023-11-24 LAB — BRAIN NATRIURETIC PEPTIDE: B Natriuretic Peptide: 90.2 pg/mL (ref 0.0–100.0)

## 2023-11-24 MED ORDER — SPIRONOLACTONE 25 MG PO TABS: 50.0000 mg | ORAL_TABLET | Freq: Every day | ORAL | 3 refills | Status: DC

## 2023-11-24 MED ORDER — PREDNISONE 20 MG PO TABS: 20.0000 mg | ORAL_TABLET | Freq: Every day | ORAL | 0 refills | Status: AC

## 2023-11-24 MED ORDER — TORSEMIDE 20 MG PO TABS: 60.0000 mg | ORAL_TABLET | Freq: Every day | ORAL | 2 refills | Status: DC

## 2023-11-24 NOTE — Patient Instructions (Signed)
 CHANGE Spironolactone  to 50 mg daily.  TAKE 20 mg of Prednisone  for 3 DAYS ONLY.  Change torsemide  to 80 mg for 3 days, then take 60 mg daily.  Labs done today, your results will be available in MyChart, we will contact you for abnormal readings.  Repeat blood work in 10 days.  Your physician recommends that you schedule a follow-up appointment in: 2 months.  If you have any questions or concerns before your next appointment please send us  a message through [place] or call our office at [phone].    TO LEAVE A MESSAGE FOR THE NURSE SELECT OPTION 2, PLEASE LEAVE A MESSAGE INCLUDING: YOUR NAME DATE OF BIRTH CALL BACK NUMBER REASON FOR CALL**this is important as we prioritize the call backs  YOU WILL RECEIVE A CALL BACK THE SAME DAY AS LONG AS YOU CALL BEFORE 4:00 PM  At the [facility], you and your health needs are our priority. As part of our continuing mission to provide you with exceptional heart care, we have created designated Provider Care Teams. These Care Teams include your primary Cardiologist (physician) and Advanced Practice Providers (APPs- Physician Assistants and Nurse Practitioners) who all work together to provide you with the care you need, when you need it.   You may see any of the following providers on your designated Care Team at your next follow up: Dr Toribio Fuel Dr Ezra Shuck Dr. Morene Brownie Greig Mosses, NP Caffie Shed, [place] [facility] [place], [place] Beckey Coe, NP Jordan Lee, NP Ellouise Class, NP Tinnie Redman, PharmD Jaun Bash, PharmD   Please be sure to bring in all your medications bottles to every appointment.    Thank you for choosing [facility]

## 2023-11-24 NOTE — Progress Notes (Signed)
 -  ADVANCED HF CLINIC NOTE  PCP: Loreli Elsie JONETTA Mickey., MD HF Cardiologist: Dr. Cherrie  HPI: [name] is a 68 y.o.y/o male w/ h/o chronic systolic heart failure, chronic afib, OSA, HTN, HLD, type 2DM, OSA on CPAP, ILD, tobacco use and venous stasis ulcers, followed by wound clinic. Also history of chemotherapy with potential cardiotoxic effects (R-CHOP for lymphoma in 2011).    Admitted in 02/2017 for acute hypoxic respiratory failure requiring intubation and ultimately tracheostomy. This was in the setting of multifocal PNA/ influenza and new onset atrial fibrillation w/ RVR. Echo showed mod LVH and severely reduced LVEF 25-30% w/ diffuse HK. RV normal. At the time, his CM was felt to be viral. Did not get cath. Per d/c summary, DCCV not pursued. Afib treated w/ rate control and Eliquis .    Repeat echo 07/2017 EF improved to 50-55%, RV mildly HK. He was noted to be in afib at the time of study.    08/2017 Zio showed Afib, well rated controlled.    Echo 11/2018 EF 45-50%, RV mildly reduced.    Unfortunately, lost to f/u by cardiology for a period of time. Had been seeing [facility] for PAD, s/p left transmetatarsal amputation.  Echo 8/23 showed EF 30-35%, severe LVH, moderately reduced RV systolic function, moderate TVR.    Admitted 8/23 with pre-syncope, ECG showed AF w/ SVR, HR 28 BPM, RBBB -> developed worsening periods of high-grade AV block and lengthy pauses w/ hypotension. Underwent TVP with improvement in symptoms. R/LHC showed diffuse nonobstructive CAD, bi-V failure R>L, mild to moderate pulmonary venous hypertension. Underwent BiV ICD. Hospitalization complicated by chronic venous stasis wounds.  Echo 01/04/22: EF 30% RV moderately down   Follow up 04/11/22, massive volume overload, with NYHA III symptoms. Only taking torsemide  on weekends. Restarted torsemide  40 bid x 3 days, then down to 40 mg daily.  Follow up 6/25, massive volume overloaded. Torsemide  increased to 60 daily,  metolazone  weekly added.  11/13/23:m Today he returns for HF follow up with his friend. Overall feeling full of fluid. He is SOB with minimal movement. Not urinating much on torsemide  or weekly metolazone . Has swelling in legs. Appetite ok. Denies palpitations, abnormal bleeding, CP, dizziness, or PND/Orthopnea. Appetite ok. Weight at home 237 pounds. Taking all medications. Sees Wound Clinic weekly for RLE wound, has wound vac. Wears BiPap.  He returns today for heart failure follow up. Overall feeling better. Use furoscix , did not notice that he was peeing at all with them, however went back to taking torsemide  and it started working again. NYHA III. Reports dyspnea and fatigue. Denies chest pain, palpitations, and dizziness. Able to perform ADLs. Appetite okay. Compliant with all medications. Sees Wound Clinic weekly, RLE healing. Wears BiPAP.  Cardiac Studies - Echo (3/25):  EF 25-30%, mod LVH - Echo (12/23): EF 30%, RV moderately down - PYP (10/23): equivocal grade 1, H/CL = 1.06 - Echo (8/23): EF 30-35%, moderately decreased LV function, RV moderate reduced, moderate TR - R/LHC (8/23): Mid RCA lesion is 40% stenosed. RPDA lesion is 95% stenosed. Ost LM lesion is 30% stenosed. Mid LAD lesion is 30% stenosed.  2nd Diag lesion is 50% stenosed.  Mid Cx lesion is 30% stenosed. Ao = 109/58 (78), LV = 119/19, RA =  21, RV = 57/19, PA = 56/22 (36), PCW = 24 (v = 30), Fick cardiac output/index = 7.3/2.7, PVR = 1.8 WU, Ao sat = 99%, PA sat = 68%, 69%, PAPi = 2. 1. Mostly non-obstructive CAD  with high-grade lesion in mid to distal RPDA 2. Nonischemic CM 3. Mild to moderate pulmonary venous HTN with evidence of RV dysfunction. Continue medical therapy. If patient develops angina can consider PCI of PDA; otherwise medical therapy.   Past Medical History:  Diagnosis Date   Acute systolic HF (heart failure) (HCC)    Arthritis    lt foot, hips knees    Atrial fibrillation (HCC)    Diabetes mellitus, new  onset (HCC)    Diverticulosis    Dyslipidemia    Gout attack 01/16/2012   Hypertension    Internal hemorrhoids    Lymphoma (HCC)    remission for about 2 years, chemo 3 years prior   Pneumonia due to COVID-19 virus 07/2018   Pulmonary hypertension (HCC)    Sepsis (HCC) 02/2017   secondary to influenza; requiring trach   Tubular adenoma of colon    Venous stasis ulcers (HCC)     Current Outpatient Medications  Medication Sig Dispense Refill   acetaminophen  (TYLENOL ) 325 MG tablet Take 650 mg by mouth every 6 (six) hours as needed for moderate pain.      allopurinol  (ZYLOPRIM ) 300 MG tablet Take 1 tablet (300 mg total) by mouth daily. 30 tablet 3   apixaban  (ELIQUIS ) 5 MG TABS tablet Take 1 tablet (5 mg total) by mouth 2 (two) times daily. 180 tablet 1   ascorbic acid  (VITAMIN C ) 500 MG tablet Take 500 mg by mouth 2 (two) times daily.     atorvastatin  (LIPITOR) 40 MG tablet TAKE 1 TABLET BY MOUTH EVERY DAY 30 tablet 11   carvedilol  (COREG ) 3.125 MG tablet Take 1 tablet (3.125 mg total) by mouth 2 (two) times daily. NEEDS FOLLOW UP APPOINTMENT FOR MORE REFILLS 180 tablet 2   Cholecalciferol  (VITAMIN D3) 1000 units CAPS Take 1 capsule by mouth daily.     colchicine  0.6 MG tablet Take 0.6 mg by mouth 2 (two) times daily as needed.     [facility] ASPIRIN  ADULT LOW DOSE 81 MG chewable tablet CHEW 1 TABLET BY MOUTH DAILY. 108 tablet 3   dapagliflozin  propanediol (FARXIGA ) 10 MG TABS tablet Take 1 tablet (10 mg total) by mouth daily. 90 tablet 1   Furosemide  (FUROSCIX ) 80 MG/10ML CTKT Inject 80 mg into the skin daily as needed (ONLY AS DIRECTED BY THE HEART FAILURE CLINIC). 10 each 0   metolazone  (ZAROXOLYN ) 2.5 MG tablet Take 1 tablet (2.5 mg total) by mouth once a week. Every Monday 12 tablet 1   Multiple Vitamins-Minerals (CENTRUM ADULTS PO) Take 1 tablet by mouth daily.     Omega 3 340 MG CPDR Take 1 capsule (340 mg total) by mouth 2 (two) times daily. 60 capsule 0   omeprazole  (PRILOSEC) 40 MG  capsule Take 1 capsule (40 mg total) by mouth daily. 30 capsule 0   potassium chloride  SA (KLOR-CON  M) 20 MEQ tablet Take 3 tablets (60 mEq total) by mouth daily. Take extra 2 tabs every Monday with metolazone  100 tablet 3   sacubitril -valsartan  (ENTRESTO ) 24-26 MG Take 1 tablet by mouth 2 (two) times daily. 60 tablet 3   sertraline  (ZOLOFT ) 50 MG tablet Take 1 tablet (50 mg total) by mouth daily. 30 tablet 0   vitamin B-12 (CYANOCOBALAMIN ) 1000 MCG tablet Take 1 tablet (1,000 mcg total) by mouth daily. 30 tablet 0   predniSONE  (DELTASONE ) 20 MG tablet Take 1 tablet (20 mg total) by mouth daily with breakfast for 3 days. FOR 3 DAYS 3 tablet 0  spironolactone  (ALDACTONE ) 25 MG tablet Take 2 tablets (50 mg total) by mouth daily. 180 tablet 3   torsemide  (DEMADEX ) 20 MG tablet Take 3 tablets (60 mg total) by mouth daily. 120 tablet 2   No current facility-administered medications for this encounter.   Allergies  Allergen Reactions   Latex Hives and Swelling   Ace Inhibitors Other (See Comments)    Other reaction(s): cough   Penicillin G Hives and Rash    Has tolerated amoxicillin    Social History   Socioeconomic History   Marital status: Divorced    Spouse name: Not on file   Number of children: Not on file   Years of education: Not on file   Highest education level: Not on file  Occupational History   Occupation: cosmetologist  Tobacco Use   Smoking status: Former    Current packs/day: 0.00    Average packs/day: 0.5 packs/day for 20.0 years (10.0 ttl pk-yrs)    Types: Cigarettes    Start date: 01/10/1989    Quit date: 01/10/2009    Years since quitting: 14.8   Smokeless tobacco: Never  Vaping Use   Vaping status: Never Used  Substance and Sexual Activity   Alcohol  use: Not Currently    Comment: 2-3 times per week   Drug use: No   Sexual activity: Not on file  Other Topics Concern   Not on file  Social History Narrative   Not on file   Social Drivers of Health    Financial Resource Strain: Not on file  Food Insecurity: No Food Insecurity (03/30/2023)   Hunger Vital Sign    Worried About Running Out of Food in the Last Year: Never true    Ran Out of Food in the Last Year: Never true  Transportation Needs: No Transportation Needs (03/30/2023)   PRAPARE - Administrator, Civil Service (Medical): No    Lack of Transportation (Non-Medical): No  Physical Activity: Not on file  Stress: Not on file  Social Connections: Moderately Isolated (03/30/2023)   Social Connection and Isolation Panel    Frequency of Communication with Friends and Family: More than three times a week    Frequency of Social Gatherings with Friends and Family: More than three times a week    Attends Religious Services: More than 4 times per year    Active Member of [employer] or Organizations: No    Attends Banker Meetings: Never    Marital Status: Divorced  Catering Manager Violence: Not At Risk (03/30/2023)   Humiliation, Afraid, Rape, and Kick questionnaire    Fear of Current or Ex-Partner: No    Emotionally Abused: No    Physically Abused: No    Sexually Abused: No   Family History  Problem Relation Age of Onset   Dementia Brother 47       frontal lobe    Dementia Mother    Arthritis Mother    Hypertension Mother    CVA Sister 62   Colon cancer Neg Hx    Pancreatic cancer Neg Hx    Stomach cancer Neg Hx    Rectal cancer Neg Hx    Liver cancer Neg Hx    BP (!) 142/80   Pulse 70   Ht 5' 11 (1.803 m)   Wt (!) 152.9 kg (337 lb)   SpO2 95%   BMI 47.00 kg/m   Wt Readings from Last 3 Encounters:  11/24/23 (!) 152.9 kg (337 lb)  11/13/23 (!) 153  kg (337 lb 6.4 oz)  09/25/23 (!) 151.5 kg (334 lb)   PHYSICAL EXAM: General: Well appearing. No distress  Cardiac: JVP unable to assess. No murmurs  Resp: Lung sounds clear and equal B/L Abdomen: Soft, non-distended.  Extremities: Warm and dry.  2+ BLE edema.  Neuro: A&O x3. Affect pleasant.    Device interrogation (personally reviewed): OptiVol starting to trend down, 0.0 hr/day of activity, no AT/AF,  99.4% BiV pacing  ECG (personally reviewed): VP with a PVC 74 bpm   ASSESSMENT & PLAN:  Chronic Systolic Biventricular Heart Failure - Echo (2/19): EF 25-30% w/ diffuse HK. RV normal. Felt to be viral CM +/- component of tachymediated in setting of influenza PNA and rapid Afib  - Echo (7/19): EF recovered, 50-55%, RV mildly reduced - Echo (8/23): EF 30-35%, severe LVH, moderately reduced RV systolic function, moderate TR - R/LHC (8/23): diffuse non-obstructive CAD with 90% lesion in dLAD, BiV failure R>L: RA 21 PA 56/22 (36) PCW 24 Fick 7.3/2.7 PaPi 2.1 - Myeloma panel and urine immunofixation negative.  - PYP scan 10/23 equivocal. Gene testing 11/23 negative  - Echo (3/25): EF 25-30%, mod LVH - s/p MDT CRT-D  - NYHA IIIb, Volume up but coming down - Increase torsemide  to 80 mg for 3 days, then back to 60 mg. If weight starts to trend up can go back to 80. - Continue metolazone  2.5 mg + extra 40 KCL on Mondays. - Increase spironolactone  to 50 mg daily.  - Continue Farxiga  10 mg daily.  - Continue carvedilol  3.125 mg bid.  - Continue Entresto  24/26 mg bid. - Labs today. Repeat labs in a week  2. Profound Bradycardia w/ High-grade AV block - s/p BiV ICD. - Interrogation as above   3. Paroxysmal Afib - Dates back to at least 2019, has been historically well rate controlled. - no AF detection on device interrogation  - Continue CPAP - Continue Eliquis  5 mg bid. No bleeding issues   4. NSVT - Continue coreg   - Continue diuresis - Labs today.  5. PAD/ Chronic Venous Stasis Wounds  - s/p left transmetatarsal amputation - Followed by [facility] and Wound clinic  6. H/o Lymphoma - s/p R-CHOP in 2011 (potential cardiotoxic effects)    7. Type 2DM - Hgb A1c 5.8 - On SGLT2i    8. CAD - LHC (8/23) diffuse mostly non-obstructive CAD, not cause of CM. - If has angina, can  consider PCI PDA - No chest pain - Continue statin.  - Off ASA with Eliquis    9. Morbid obesity - Body mass index is 47 kg/m. - Insurance did not cover Mounjaro. Did not tolerate Ozempic   10. ILD - High res CT nonspecific, but ? NSIP vs chronic hypersensitivity pneumonitis - PFTs 2024 with moderate restrictive disease not changed by bronchodilators - Followed by Pulmonary  11. Gout - on allopurinol  - has PRN colchicine  - start prednisone  20 mg for 3 days  Follow up in 2 month with APP  Eleshia Wooley, NP  3:44 PM

## 2023-11-24 NOTE — Addendum Note (Signed)
 Encounter addended by: Janzen Sacks, NP on: 11/24/2023 3:49 PM  Actions taken: Medication List reviewed, Problem List reviewed, Allergies reviewed, Level of Service modified, Visit diagnoses modified

## 2023-11-27 ENCOUNTER — Other Ambulatory Visit (HOSPITAL_COMMUNITY): Payer: Self-pay

## 2023-11-27 ENCOUNTER — Ambulatory Visit (HOSPITAL_COMMUNITY): Payer: Self-pay | Admitting: Cardiology

## 2023-11-29 ENCOUNTER — Encounter (HOSPITAL_BASED_OUTPATIENT_CLINIC_OR_DEPARTMENT_OTHER): Admitting: Internal Medicine

## 2023-11-29 ENCOUNTER — Encounter (HOSPITAL_COMMUNITY): Payer: Self-pay

## 2023-11-29 DIAGNOSIS — S90811A Abrasion, right foot, initial encounter: Secondary | ICD-10-CM | POA: Diagnosis not present

## 2023-11-29 DIAGNOSIS — I890 Lymphedema, not elsewhere classified: Secondary | ICD-10-CM | POA: Diagnosis not present

## 2023-11-29 DIAGNOSIS — E11621 Type 2 diabetes mellitus with foot ulcer: Secondary | ICD-10-CM | POA: Diagnosis not present

## 2023-11-29 DIAGNOSIS — M10071 Idiopathic gout, right ankle and foot: Secondary | ICD-10-CM | POA: Diagnosis not present

## 2023-11-29 DIAGNOSIS — X58XXXA Exposure to other specified factors, initial encounter: Secondary | ICD-10-CM | POA: Diagnosis not present

## 2023-11-29 DIAGNOSIS — L97518 Non-pressure chronic ulcer of other part of right foot with other specified severity: Secondary | ICD-10-CM

## 2023-11-29 DIAGNOSIS — I87311 Chronic venous hypertension (idiopathic) with ulcer of right lower extremity: Secondary | ICD-10-CM

## 2023-11-29 DIAGNOSIS — S90822A Blister (nonthermal), left foot, initial encounter: Secondary | ICD-10-CM | POA: Diagnosis not present

## 2023-11-30 ENCOUNTER — Encounter: Payer: Self-pay | Admitting: Cardiology

## 2023-12-03 ENCOUNTER — Other Ambulatory Visit (HOSPITAL_COMMUNITY): Payer: Self-pay | Admitting: Internal Medicine

## 2023-12-04 ENCOUNTER — Ambulatory Visit (HOSPITAL_COMMUNITY)
Admission: RE | Admit: 2023-12-04 | Discharge: 2023-12-04 | Disposition: A | Discharge status: Home / Self Care | Source: Ambulatory Visit | Attending: Cardiology | Admitting: Cardiology

## 2023-12-04 DIAGNOSIS — I5022 Chronic systolic (congestive) heart failure: Secondary | ICD-10-CM | POA: Diagnosis not present

## 2023-12-04 LAB — BASIC METABOLIC PANEL WITH GFR
Anion gap: 16 — ABNORMAL HIGH (ref 5–15)
BUN: 37 mg/dL — ABNORMAL HIGH (ref 8–23)
CO2: 23 mmol/L (ref 22–32)
Calcium: 9.7 mg/dL (ref 8.9–10.3)
Chloride: 95 mmol/L — ABNORMAL LOW (ref 98–111)
Creatinine, Ser: 1.3 mg/dL — ABNORMAL HIGH (ref 0.61–1.24)
GFR, Estimated: 60 mL/min — ABNORMAL LOW (ref >60)
Glucose, Bld: 124 mg/dL — ABNORMAL HIGH (ref 70–99)
Potassium: 4.3 mmol/L (ref 3.5–5.1)
Sodium: 134 mmol/L — ABNORMAL LOW (ref 135–145)

## 2023-12-06 ENCOUNTER — Encounter (HOSPITAL_BASED_OUTPATIENT_CLINIC_OR_DEPARTMENT_OTHER): Admitting: Internal Medicine

## 2023-12-06 DIAGNOSIS — I87311 Chronic venous hypertension (idiopathic) with ulcer of right lower extremity: Secondary | ICD-10-CM | POA: Diagnosis not present

## 2023-12-06 DIAGNOSIS — L97518 Non-pressure chronic ulcer of other part of right foot with other specified severity: Secondary | ICD-10-CM

## 2023-12-06 DIAGNOSIS — X58XXXA Exposure to other specified factors, initial encounter: Secondary | ICD-10-CM | POA: Diagnosis not present

## 2023-12-06 DIAGNOSIS — S90822A Blister (nonthermal), left foot, initial encounter: Secondary | ICD-10-CM | POA: Diagnosis not present

## 2023-12-06 DIAGNOSIS — E11621 Type 2 diabetes mellitus with foot ulcer: Secondary | ICD-10-CM | POA: Diagnosis not present

## 2023-12-06 DIAGNOSIS — I890 Lymphedema, not elsewhere classified: Secondary | ICD-10-CM | POA: Diagnosis not present

## 2023-12-06 DIAGNOSIS — S90811A Abrasion, right foot, initial encounter: Secondary | ICD-10-CM | POA: Diagnosis not present

## 2023-12-06 DIAGNOSIS — M10071 Idiopathic gout, right ankle and foot: Secondary | ICD-10-CM | POA: Diagnosis not present

## 2023-12-13 ENCOUNTER — Encounter (HOSPITAL_BASED_OUTPATIENT_CLINIC_OR_DEPARTMENT_OTHER): Admitting: Internal Medicine

## 2023-12-13 DIAGNOSIS — S90811A Abrasion, right foot, initial encounter: Secondary | ICD-10-CM | POA: Diagnosis not present

## 2023-12-13 DIAGNOSIS — X58XXXA Exposure to other specified factors, initial encounter: Secondary | ICD-10-CM | POA: Diagnosis not present

## 2023-12-13 DIAGNOSIS — S90822A Blister (nonthermal), left foot, initial encounter: Secondary | ICD-10-CM | POA: Insufficient documentation

## 2023-12-13 DIAGNOSIS — M10071 Idiopathic gout, right ankle and foot: Secondary | ICD-10-CM | POA: Diagnosis not present

## 2023-12-13 DIAGNOSIS — E11621 Type 2 diabetes mellitus with foot ulcer: Secondary | ICD-10-CM | POA: Diagnosis not present

## 2023-12-13 DIAGNOSIS — E1140 Type 2 diabetes mellitus with diabetic neuropathy, unspecified: Secondary | ICD-10-CM | POA: Diagnosis not present

## 2023-12-13 DIAGNOSIS — I87311 Chronic venous hypertension (idiopathic) with ulcer of right lower extremity: Secondary | ICD-10-CM | POA: Insufficient documentation

## 2023-12-13 DIAGNOSIS — I890 Lymphedema, not elsewhere classified: Secondary | ICD-10-CM | POA: Diagnosis not present

## 2023-12-13 DIAGNOSIS — L97518 Non-pressure chronic ulcer of other part of right foot with other specified severity: Secondary | ICD-10-CM | POA: Insufficient documentation

## 2023-12-18 ENCOUNTER — Ambulatory Visit: Payer: Medicare Other

## 2023-12-18 DIAGNOSIS — I5022 Chronic systolic (congestive) heart failure: Secondary | ICD-10-CM | POA: Diagnosis not present

## 2023-12-19 LAB — CUP PACEART REMOTE DEVICE CHECK
Battery Remaining Longevity: 62 mo
Battery Voltage: 2.98 V
Brady Statistic AP VP Percent: 98.77 %
Brady Statistic AP VS Percent: 0 %
Brady Statistic AS VP Percent: 0.19 %
Brady Statistic AS VS Percent: 1.04 %
Brady Statistic RA Percent Paced: 96.3 %
Brady Statistic RV Percent Paced: 96.53 %
Date Time Interrogation Session: 20251208033524
HighPow Impedance: 96 Ohm
Implantable Lead Connection Status: 753985
Implantable Lead Connection Status: 753985
Implantable Lead Implant Date: 20230823
Implantable Lead Implant Date: 20230823
Implantable Lead Location: 753860
Implantable Lead Location: 753860
Implantable Lead Model: 3830
Implantable Lead Model: 6935
Implantable Pulse Generator Implant Date: 20230823
Lead Channel Impedance Value: 304 Ohm
Lead Channel Impedance Value: 437 Ohm
Lead Channel Impedance Value: 456 Ohm
Lead Channel Sensing Intrinsic Amplitude: 11.125 mV
Lead Channel Sensing Intrinsic Amplitude: 11.125 mV
Lead Channel Sensing Intrinsic Amplitude: 13.125 mV
Lead Channel Sensing Intrinsic Amplitude: 13.125 mV
Lead Channel Setting Pacing Amplitude: 1.5 V
Lead Channel Setting Pacing Amplitude: 2.5 V
Lead Channel Setting Pacing Pulse Width: 0.4 ms
Lead Channel Setting Sensing Sensitivity: 0.3 mV
Zone Setting Status: 755011

## 2023-12-20 ENCOUNTER — Ambulatory Visit: Payer: Self-pay | Admitting: Internal Medicine

## 2023-12-20 ENCOUNTER — Encounter (HOSPITAL_BASED_OUTPATIENT_CLINIC_OR_DEPARTMENT_OTHER): Admitting: Internal Medicine

## 2023-12-20 DIAGNOSIS — L97518 Non-pressure chronic ulcer of other part of right foot with other specified severity: Secondary | ICD-10-CM | POA: Diagnosis not present

## 2023-12-20 DIAGNOSIS — E11621 Type 2 diabetes mellitus with foot ulcer: Secondary | ICD-10-CM | POA: Diagnosis not present

## 2023-12-20 DIAGNOSIS — I87311 Chronic venous hypertension (idiopathic) with ulcer of right lower extremity: Secondary | ICD-10-CM | POA: Diagnosis not present

## 2023-12-20 NOTE — Progress Notes (Signed)
 Remote ICD Transmission

## 2023-12-21 ENCOUNTER — Other Ambulatory Visit (HOSPITAL_COMMUNITY): Payer: Self-pay | Admitting: Cardiology

## 2023-12-27 ENCOUNTER — Encounter (HOSPITAL_BASED_OUTPATIENT_CLINIC_OR_DEPARTMENT_OTHER): Admitting: Internal Medicine

## 2023-12-27 DIAGNOSIS — E11621 Type 2 diabetes mellitus with foot ulcer: Secondary | ICD-10-CM | POA: Diagnosis not present

## 2023-12-27 DIAGNOSIS — L97518 Non-pressure chronic ulcer of other part of right foot with other specified severity: Secondary | ICD-10-CM

## 2023-12-27 DIAGNOSIS — I87311 Chronic venous hypertension (idiopathic) with ulcer of right lower extremity: Secondary | ICD-10-CM

## 2024-01-03 ENCOUNTER — Encounter (HOSPITAL_BASED_OUTPATIENT_CLINIC_OR_DEPARTMENT_OTHER): Admitting: General Surgery

## 2024-01-03 DIAGNOSIS — I87311 Chronic venous hypertension (idiopathic) with ulcer of right lower extremity: Secondary | ICD-10-CM | POA: Diagnosis not present

## 2024-01-09 ENCOUNTER — Encounter (HOSPITAL_BASED_OUTPATIENT_CLINIC_OR_DEPARTMENT_OTHER): Admitting: Internal Medicine

## 2024-01-09 DIAGNOSIS — L97512 Non-pressure chronic ulcer of other part of right foot with fat layer exposed: Secondary | ICD-10-CM | POA: Diagnosis not present

## 2024-01-09 DIAGNOSIS — L97312 Non-pressure chronic ulcer of right ankle with fat layer exposed: Secondary | ICD-10-CM

## 2024-01-09 DIAGNOSIS — E11621 Type 2 diabetes mellitus with foot ulcer: Secondary | ICD-10-CM

## 2024-01-09 DIAGNOSIS — I87311 Chronic venous hypertension (idiopathic) with ulcer of right lower extremity: Secondary | ICD-10-CM | POA: Diagnosis not present

## 2024-01-11 ENCOUNTER — Encounter: Payer: Self-pay | Admitting: Internal Medicine

## 2024-01-17 ENCOUNTER — Encounter (HOSPITAL_BASED_OUTPATIENT_CLINIC_OR_DEPARTMENT_OTHER): Attending: Internal Medicine | Admitting: Internal Medicine

## 2024-01-17 DIAGNOSIS — L97312 Non-pressure chronic ulcer of right ankle with fat layer exposed: Secondary | ICD-10-CM | POA: Insufficient documentation

## 2024-01-17 DIAGNOSIS — I890 Lymphedema, not elsewhere classified: Secondary | ICD-10-CM | POA: Insufficient documentation

## 2024-01-17 DIAGNOSIS — L97512 Non-pressure chronic ulcer of other part of right foot with fat layer exposed: Secondary | ICD-10-CM | POA: Insufficient documentation

## 2024-01-17 DIAGNOSIS — I87311 Chronic venous hypertension (idiopathic) with ulcer of right lower extremity: Secondary | ICD-10-CM | POA: Insufficient documentation

## 2024-01-17 DIAGNOSIS — E11621 Type 2 diabetes mellitus with foot ulcer: Secondary | ICD-10-CM | POA: Insufficient documentation

## 2024-01-24 ENCOUNTER — Encounter (HOSPITAL_BASED_OUTPATIENT_CLINIC_OR_DEPARTMENT_OTHER): Admitting: Internal Medicine

## 2024-01-24 DIAGNOSIS — I87311 Chronic venous hypertension (idiopathic) with ulcer of right lower extremity: Secondary | ICD-10-CM

## 2024-01-24 DIAGNOSIS — L97512 Non-pressure chronic ulcer of other part of right foot with fat layer exposed: Secondary | ICD-10-CM

## 2024-01-24 DIAGNOSIS — L97312 Non-pressure chronic ulcer of right ankle with fat layer exposed: Secondary | ICD-10-CM

## 2024-01-24 DIAGNOSIS — E11621 Type 2 diabetes mellitus with foot ulcer: Secondary | ICD-10-CM

## 2024-01-26 ENCOUNTER — Telehealth (HOSPITAL_COMMUNITY): Payer: Self-pay

## 2024-01-26 ENCOUNTER — Encounter: Payer: Self-pay | Admitting: Internal Medicine

## 2024-01-26 NOTE — Telephone Encounter (Signed)
 Called to confirm/remind patient of their appointment [facility] on 01/29/[facility].   Appointment:   [x] Confirmed  [] Left mess   [] No answer/No voice mail  [] VM Full/unable to leave message  [] Phone not in service  Patient reminded to bring all medications and/or complete list.  Confirmed patient has transportation. Gave directions, instructed to utilize valet parking.

## 2024-01-28 NOTE — Progress Notes (Signed)
 -  ADVANCED HF CLINIC NOTE  PCP: Loreli Elsie JONETTA Mickey., MD HF Cardiologist: Dr. Cherrie  HPI: [name] is a 68 y.o.y/o male w/ h/o chronic systolic heart failure, chronic afib, OSA, HTN, HLD, type 2DM, OSA on CPAP, ILD, tobacco use and venous stasis ulcers, followed by wound clinic. Also history of chemotherapy with potential cardiotoxic effects (R-CHOP for lymphoma in 2011).    Admitted in 02/2017 for acute hypoxic respiratory failure requiring intubation and ultimately tracheostomy. This was in the setting of multifocal PNA/ influenza and new onset atrial fibrillation w/ RVR. Echo showed mod LVH and severely reduced LVEF 25-30% w/ diffuse HK. RV normal. At the time, his CM was felt to be viral. Did not get cath. Per d/c summary, DCCV not pursued. Afib treated w/ rate control and Eliquis .    Repeat echo 07/2017 EF improved to 50-55%, RV mildly HK. He was noted to be in afib at the time of study.    08/2017 Zio showed Afib, well rated controlled.    Echo 11/2018 EF 45-50%, RV mildly reduced.    Unfortunately, lost to f/u by cardiology for a period of time. Had been seeing [facility] for PAD, s/p left transmetatarsal amputation.  Echo 8/23 showed EF 30-35%, severe LVH, moderately reduced RV systolic function, moderate TVR.    Admitted 8/23 with pre-syncope, ECG showed AF w/ SVR, HR 28 BPM, RBBB -> developed worsening periods of high-grade AV block and lengthy pauses w/ hypotension. Underwent TVP with improvement in symptoms. R/LHC showed diffuse nonobstructive CAD, bi-V failure R>L, mild to moderate pulmonary venous hypertension. Underwent BiV ICD. Hospitalization complicated by chronic venous stasis wounds.  Echo 01/04/22: EF 30% RV moderately down   Follow up 04/11/22, massive volume overload, with NYHA III symptoms. Only taking torsemide  on weekends. Restarted torsemide  40 bid x 3 days, then down to 40 mg daily.  Follow up 6/25, massive volume overloaded. Torsemide  increased to 60 daily,  metolazone  weekly added.  Today he returns for HF follow up.Overall feeling fine. Yesterday he took an extra 20 mg torsemide  because he felt like he had fluid. SOB with exertion but this is his baseline. Denies PND/Orthopnea. [facility].Appetite ok. No fever or chills. Weight at home up and down. Taking all medications.   Cardiac Studies - Echo (3/25):  EF 25-30%, mod LVH - Echo (12/23): EF 30%, RV moderately down - PYP (10/23): equivocal grade 1, H/CL = 1.06 - Echo (8/23): EF 30-35%, moderately decreased LV function, RV moderate reduced, moderate TR - R/LHC (8/23): Mid RCA lesion is 40% stenosed. RPDA lesion is 95% stenosed. Ost LM lesion is 30% stenosed. Mid LAD lesion is 30% stenosed.  2nd Diag lesion is 50% stenosed.  Mid Cx lesion is 30% stenosed. Ao = 109/58 (78), LV = 119/19, RA =  21, RV = 57/19, PA = 56/22 (36), PCW = 24 (v = 30), Fick cardiac output/index = 7.3/2.7, PVR = 1.8 WU, Ao sat = 99%, PA sat = 68%, 69%, PAPi = 2. 1. Mostly non-obstructive CAD with high-grade lesion in mid to distal RPDA 2. Nonischemic CM 3. Mild to moderate pulmonary venous HTN with evidence of RV dysfunction. Continue medical therapy. If patient develops angina can consider PCI of PDA; otherwise medical therapy.   Past Medical History:  Diagnosis Date   Acute systolic HF (heart failure) (HCC)    Arthritis    lt foot, hips knees    Atrial fibrillation (HCC)    Diabetes  mellitus, new onset (HCC)    Diverticulosis    Dyslipidemia    Gout attack 01/16/2012   Hypertension    Internal hemorrhoids    Lymphoma (HCC)    remission for about 2 years, chemo 3 years prior   Pneumonia due to COVID-19 virus 07/2018   Pulmonary hypertension (HCC)    Sepsis (HCC) 02/2017   secondary to influenza; requiring trach   Tubular adenoma of colon    Venous stasis ulcers (HCC)     Current Outpatient Medications  Medication Sig Dispense Refill   acetaminophen  (TYLENOL ) 325 MG tablet Take  650 mg by mouth every 6 (six) hours as needed for moderate pain.      allopurinol  (ZYLOPRIM ) 300 MG tablet TAKE 1 TABLET BY MOUTH EVERY DAY 90 tablet 1   apixaban  (ELIQUIS ) 5 MG TABS tablet Take 1 tablet (5 mg total) by mouth 2 (two) times daily. 180 tablet 1   ascorbic acid  (VITAMIN C ) 500 MG tablet Take 500 mg by mouth 2 (two) times daily.     atorvastatin  (LIPITOR) 40 MG tablet TAKE 1 TABLET BY MOUTH EVERY DAY 30 tablet 11   carvedilol  (COREG ) 3.125 MG tablet Take 1 tablet (3.125 mg total) by mouth 2 (two) times daily. NEEDS FOLLOW UP APPOINTMENT FOR MORE REFILLS 180 tablet 2   Cholecalciferol  (VITAMIN D3) 1000 units CAPS Take 1 capsule by mouth daily.     colchicine  0.6 MG tablet Take 0.6 mg by mouth 2 (two) times daily as needed.     [facility] ASPIRIN  ADULT LOW DOSE 81 MG chewable tablet CHEW 1 TABLET BY MOUTH DAILY. 108 tablet 3   dapagliflozin  propanediol (FARXIGA ) 10 MG TABS tablet Take 1 tablet (10 mg total) by mouth daily. 90 tablet 1   Furosemide  (FUROSCIX ) 80 MG/10ML CTKT Inject 80 mg into the skin daily as needed (ONLY AS DIRECTED BY THE HEART FAILURE CLINIC). 10 each 0   metolazone  (ZAROXOLYN ) 2.5 MG tablet Take 1 tablet (2.5 mg total) by mouth once a week. Every Monday 12 tablet 1   Multiple Vitamins-Minerals (CENTRUM ADULTS PO) Take 1 tablet by mouth daily.     Omega 3 340 MG CPDR Take 1 capsule (340 mg total) by mouth 2 (two) times daily. 60 capsule 0   omeprazole  (PRILOSEC) 40 MG capsule Take 1 capsule (40 mg total) by mouth daily. 30 capsule 0   potassium chloride  SA (KLOR-CON  M) 20 MEQ tablet Take 3 tablets (60 mEq total) by mouth daily. Take extra 2 tabs every Monday with metolazone  100 tablet 3   sacubitril -valsartan  (ENTRESTO ) 24-26 MG Take 1 tablet by mouth 2 (two) times daily. 60 tablet 3   sertraline  (ZOLOFT ) 50 MG tablet Take 1 tablet (50 mg total) by mouth daily. 30 tablet 0   spironolactone  (ALDACTONE ) 25 MG tablet Take 2 tablets (50 mg total) by mouth daily. 180 tablet 3    torsemide  (DEMADEX ) 20 MG tablet Take 3 tablets (60 mg total) by mouth daily. 120 tablet 2   vitamin B-12 (CYANOCOBALAMIN ) 1000 MCG tablet Take 1 tablet (1,000 mcg total) by mouth daily. 30 tablet 0   No current facility-administered medications for this encounter.   Allergies  Allergen Reactions   Latex Hives and Swelling   Ace Inhibitors Other (See Comments)    Other reaction(s): cough   Penicillin G Hives and Rash    Has tolerated amoxicillin    Social History   Socioeconomic History   Marital status: Divorced    Spouse name: Not on  file   Number of children: Not on file   Years of education: Not on file   Highest education level: Not on file  Occupational History   Occupation: cosmetologist  Tobacco Use   Smoking status: Former    Current packs/day: 0.00    Average packs/day: 0.5 packs/day for 20.0 years (10.0 ttl pk-yrs)    Types: Cigarettes    Start date: 01/10/1989    Quit date: 01/10/2009    Years since quitting: 15.0   Smokeless tobacco: Never  Vaping Use   Vaping status: Never Used  Substance and Sexual Activity   Alcohol  use: Not Currently    Comment: 2-3 times per week   Drug use: No   Sexual activity: Not on file  Other Topics Concern   Not on file  Social History Narrative   Not on file   Social Drivers of Health   Tobacco Use: Medium Risk (11/24/2023)   Patient History    Smoking Tobacco Use: Former    Smokeless Tobacco Use: Never    Passive Exposure: Not on Actuary Strain: Not on file  Food Insecurity: No Food Insecurity (03/30/2023)   Hunger Vital Sign    Worried About Running Out of Food in the Last Year: Never true    Ran Out of Food in the Last Year: Never true  Transportation Needs: No Transportation Needs (03/30/2023)   PRAPARE - Administrator, Civil Service (Medical): No    Lack of Transportation (Non-Medical): No  Physical Activity: Not on file  Stress: Not on file  Social Connections: Moderately Isolated  (03/30/2023)   Social Connection and Isolation Panel    Frequency of Communication with Friends and Family: More than three times a week    Frequency of Social Gatherings with Friends and Family: More than three times a week    Attends Religious Services: More than 4 times per year    Active Member of [employer] or Organizations: No    Attends Banker Meetings: Never    Marital Status: Divorced  Catering Manager Violence: Not At Risk (03/30/2023)   Humiliation, Afraid, Rape, and Kick questionnaire    Fear of Current or Ex-Partner: No    Emotionally Abused: No    Physically Abused: No    Sexually Abused: No  Depression (PHQ2-9): Not on file  Alcohol  Screen: Not on file  Housing: Low Risk (03/30/2023)   Housing Stability Vital Sign    Unable to Pay for Housing in the Last Year: No    Number of Times Moved in the Last Year: 0    Homeless in the Last Year: No  Utilities: Not At Risk (03/30/2023)   AHC Utilities    Threatened with loss of utilities: No  Health Literacy: Not on file   Family History  Problem Relation Age of Onset   Dementia Brother 41       frontal lobe    Dementia Mother    Arthritis Mother    Hypertension Mother    CVA Sister 54   Colon cancer Neg Hx    Pancreatic cancer Neg Hx    Stomach cancer Neg Hx    Rectal cancer Neg Hx    Liver cancer Neg Hx    BP 134/78   Pulse 85   Wt (!) 153 kg (337 lb 6.4 oz)   SpO2 93%   BMI 47.06 kg/m   Wt Readings from Last 3 Encounters:  01/29/24 (!) 153 kg (337  lb 6.4 oz)  11/24/23 (!) 152.9 kg (337 lb)  11/13/23 (!) 153 kg (337 lb 6.4 oz)   PHYSICAL EXAM: General:   No resp difficulty. Waked in the clinic with a walker.  Neck: Difficult to assess due to body habitus.  Cor: Regular rate & rhythm.  Lungs: clear Abdomen: soft, nontender, nondistended.  Extremities: R and LLE trace  edema Neuro: alert & oriented x3  Device interrogation - Activity < 1 hour per day. Impedance at baseline. No VT.      ASSESSMENT & PLAN:  Chronic Systolic Biventricular Heart Failure - Echo (2/19): EF 25-30% w/ diffuse HK. RV normal. Felt to be viral CM +/- component of tachymediated in setting of influenza PNA and rapid Afib  - Echo (7/19): EF recovered, 50-55%, RV mildly reduced - Echo (8/23): EF 30-35%, severe LVH, moderately reduced RV systolic function, moderate TR - R/LHC (8/23): diffuse non-obstructive CAD with 90% lesion in dLAD, BiV failure R>L: RA 21 PA 56/22 (36) PCW 24 Fick 7.3/2.7 PaPi 2.1 - Myeloma panel and urine immunofixation negative.  - PYP scan 10/23 equivocal. Gene testing 11/23 negative  - Echo (3/25): EF 25-30%, mod LVH - s/p MDT CRT-D  - NYHA III.Device interrogation is not suggestive of fluid accumulation.  We discussed.  Volume status stable.  Send script for torsemide  80 mg daily with extra 20 mg as needed.  - Continue metolazone  2.5 mg + extra 40 KCL on Mondays. - he has been taking spiro 25 mg daily. Continue for now. Check BMET   - Continue Farxiga  10 mg daily.  - Continue carvedilol  3.125 mg bid.  - Continue Entresto  24/26 mg bid. -Check BMET   2. Profound Bradycardia w/ High-grade AV block - s/p BiV ICD. - Interrogation as above   3. Paroxysmal Afib - Dates back to at least 2019 - No AF on device.  - Continue CPAP - Continue Eliquis  5 mg bid. No bleeding issues   4. NSVT - Continue coreg    5. PAD/ Chronic Venous Stasis Wounds  - s/p left transmetatarsal amputation - Followed by [facility] and Wound clinic  6. H/o Lymphoma - s/p R-CHOP in 2011 (potential cardiotoxic effects)    7. Type 2DM - Hgb A1c 5.8 - On SGLT2i    8. CAD - LHC (8/23) diffuse mostly non-obstructive CAD, not cause of CM. - If has angina, can consider PCI PDA - No chest pain.  - Continue statin.  - Off ASA with Eliquis    9. Morbid obesity - Body mass index is 47.06 kg/m. - Insurance did not cover Mounjaro. Did not tolerate Ozempic   10. ILD - High res CT nonspecific, but ? NSIP vs  chronic hypersensitivity pneumonitis - PFTs 2024 with moderate restrictive disease not changed by bronchodilators - Followed by Pulmonary  11. Gout - on allopurinol  - has PRN colchicine . Refill colchicine  x1. Asked him to follow up with PCP.   Follow up with Dr Cherrie in 3 months. I spent 30 minutes reviewing records, interviewing/examining patient, and managing orders.     Greig Mosses, NP  2:27 PM

## 2024-01-29 ENCOUNTER — Ambulatory Visit (HOSPITAL_COMMUNITY): Payer: Self-pay | Admitting: Adult Health

## 2024-01-29 ENCOUNTER — Ambulatory Visit (HOSPITAL_COMMUNITY)
Admission: RE | Admit: 2024-01-29 | Discharge: 2024-01-29 | Disposition: A | Source: Ambulatory Visit | Attending: Adult Health | Admitting: Adult Health

## 2024-01-29 ENCOUNTER — Other Ambulatory Visit: Payer: Self-pay

## 2024-01-29 VITALS — BP 134/78 | HR 85 | Wt 337.4 lb

## 2024-01-29 DIAGNOSIS — I878 Other specified disorders of veins: Secondary | ICD-10-CM | POA: Diagnosis not present

## 2024-01-29 DIAGNOSIS — E785 Hyperlipidemia, unspecified: Secondary | ICD-10-CM | POA: Insufficient documentation

## 2024-01-29 DIAGNOSIS — I4510 Unspecified right bundle-branch block: Secondary | ICD-10-CM | POA: Insufficient documentation

## 2024-01-29 DIAGNOSIS — E119 Type 2 diabetes mellitus without complications: Secondary | ICD-10-CM | POA: Insufficient documentation

## 2024-01-29 DIAGNOSIS — R001 Bradycardia, unspecified: Secondary | ICD-10-CM | POA: Diagnosis not present

## 2024-01-29 DIAGNOSIS — I5022 Chronic systolic (congestive) heart failure: Secondary | ICD-10-CM | POA: Diagnosis not present

## 2024-01-29 DIAGNOSIS — Z9221 Personal history of antineoplastic chemotherapy: Secondary | ICD-10-CM | POA: Insufficient documentation

## 2024-01-29 DIAGNOSIS — G4733 Obstructive sleep apnea (adult) (pediatric): Secondary | ICD-10-CM | POA: Diagnosis not present

## 2024-01-29 DIAGNOSIS — I480 Paroxysmal atrial fibrillation: Secondary | ICD-10-CM | POA: Diagnosis not present

## 2024-01-29 DIAGNOSIS — J849 Interstitial pulmonary disease, unspecified: Secondary | ICD-10-CM | POA: Diagnosis not present

## 2024-01-29 DIAGNOSIS — M109 Gout, unspecified: Secondary | ICD-10-CM | POA: Insufficient documentation

## 2024-01-29 DIAGNOSIS — I4430 Unspecified atrioventricular block: Secondary | ICD-10-CM | POA: Insufficient documentation

## 2024-01-29 DIAGNOSIS — Z7984 Long term (current) use of oral hypoglycemic drugs: Secondary | ICD-10-CM | POA: Diagnosis not present

## 2024-01-29 DIAGNOSIS — I428 Other cardiomyopathies: Secondary | ICD-10-CM | POA: Insufficient documentation

## 2024-01-29 DIAGNOSIS — Z7901 Long term (current) use of anticoagulants: Secondary | ICD-10-CM | POA: Insufficient documentation

## 2024-01-29 DIAGNOSIS — I110 Hypertensive heart disease with heart failure: Secondary | ICD-10-CM | POA: Insufficient documentation

## 2024-01-29 DIAGNOSIS — I5082 Biventricular heart failure: Secondary | ICD-10-CM | POA: Insufficient documentation

## 2024-01-29 DIAGNOSIS — Z8572 Personal history of non-Hodgkin lymphomas: Secondary | ICD-10-CM | POA: Diagnosis not present

## 2024-01-29 DIAGNOSIS — I2510 Atherosclerotic heart disease of native coronary artery without angina pectoris: Secondary | ICD-10-CM | POA: Insufficient documentation

## 2024-01-29 DIAGNOSIS — I739 Peripheral vascular disease, unspecified: Secondary | ICD-10-CM | POA: Insufficient documentation

## 2024-01-29 DIAGNOSIS — Z8616 Personal history of COVID-19: Secondary | ICD-10-CM | POA: Diagnosis not present

## 2024-01-29 DIAGNOSIS — Z87891 Personal history of nicotine dependence: Secondary | ICD-10-CM | POA: Insufficient documentation

## 2024-01-29 DIAGNOSIS — Z6841 Body Mass Index (BMI) 40.0 and over, adult: Secondary | ICD-10-CM | POA: Diagnosis not present

## 2024-01-29 DIAGNOSIS — I472 Ventricular tachycardia, unspecified: Secondary | ICD-10-CM | POA: Insufficient documentation

## 2024-01-29 DIAGNOSIS — Z79899 Other long term (current) drug therapy: Secondary | ICD-10-CM | POA: Diagnosis not present

## 2024-01-29 LAB — BASIC METABOLIC PANEL WITH GFR
Anion gap: 14 (ref 5–15)
BUN: 35 mg/dL — ABNORMAL HIGH (ref 8–23)
CO2: 28 mmol/L (ref 22–32)
Calcium: 9.6 mg/dL (ref 8.9–10.3)
Chloride: 94 mmol/L — ABNORMAL LOW (ref 98–111)
Creatinine, Ser: 1.26 mg/dL — ABNORMAL HIGH (ref 0.61–1.24)
GFR, Estimated: >60 mL/min
Glucose, Bld: 117 mg/dL — ABNORMAL HIGH (ref 70–99)
Potassium: 4.8 mmol/L (ref 3.5–5.1)
Sodium: 136 mmol/L (ref 135–145)

## 2024-01-29 MED ORDER — METOLAZONE 2.5 MG PO TABS: 2.5000 mg | ORAL_TABLET | ORAL | 3 refills | Status: AC

## 2024-01-29 MED ORDER — SPIRONOLACTONE 25 MG PO TABS: 25.0000 mg | ORAL_TABLET | Freq: Every day | ORAL | 3 refills | Status: AC

## 2024-01-29 MED ORDER — TORSEMIDE 20 MG PO TABS: 80.0000 mg | ORAL_TABLET | Freq: Every day | ORAL | 3 refills | Status: AC

## 2024-01-29 MED ORDER — COLCHICINE 0.6 MG PO TABS: 0.6000 mg | ORAL_TABLET | Freq: Two times a day (BID) | ORAL | 1 refills | Status: AC | PRN

## 2024-01-29 NOTE — Patient Instructions (Signed)
 Take Spiro 25 mg daily - updated Rx sent. Take Torsemide  80 mg daily - updated Rx sent. Refill sent for metolazone . Refill sent for colchicine  - please follow up with your primary care physician for any future refills. Labs today - will call you if abnormal. Return to see Dr. Cherrie in 3 months - see below. Please call us  at [phone] if any questions or concerns prior to your next visit.

## 2024-01-31 ENCOUNTER — Encounter: Payer: Self-pay | Admitting: Internal Medicine

## 2024-01-31 ENCOUNTER — Encounter (HOSPITAL_BASED_OUTPATIENT_CLINIC_OR_DEPARTMENT_OTHER): Admitting: Internal Medicine

## 2024-01-31 DIAGNOSIS — E11621 Type 2 diabetes mellitus with foot ulcer: Secondary | ICD-10-CM | POA: Diagnosis not present

## 2024-01-31 DIAGNOSIS — L97312 Non-pressure chronic ulcer of right ankle with fat layer exposed: Secondary | ICD-10-CM | POA: Diagnosis not present

## 2024-01-31 DIAGNOSIS — I87311 Chronic venous hypertension (idiopathic) with ulcer of right lower extremity: Secondary | ICD-10-CM

## 2024-01-31 DIAGNOSIS — L97512 Non-pressure chronic ulcer of other part of right foot with fat layer exposed: Secondary | ICD-10-CM

## 2024-02-05 ENCOUNTER — Other Ambulatory Visit (HOSPITAL_COMMUNITY): Payer: Self-pay

## 2024-02-05 ENCOUNTER — Telehealth (HOSPITAL_COMMUNITY): Payer: Self-pay

## 2024-02-05 NOTE — Telephone Encounter (Signed)
 Advanced Heart Failure Patient Advocate Encounter  The patient was renewed for a [employer] grant that will help cover the cost of Carvedilol , Eliquis , Entresto , Farxiga , Spironolactone .  Total amount awarded, $7,500.  Effective: 01/06/2024 - 01/04/2025.  BIN [id number] PCN [id number] Group [id number] ID [id number]  Pharmacy provided with approval and processing information. Patient informed via MyChart.  Rachel DEL, CPhT Rx Patient Advocate Phone: [phone]

## 2024-02-07 ENCOUNTER — Encounter (HOSPITAL_BASED_OUTPATIENT_CLINIC_OR_DEPARTMENT_OTHER): Admitting: Internal Medicine

## 2024-02-07 DIAGNOSIS — E11621 Type 2 diabetes mellitus with foot ulcer: Secondary | ICD-10-CM

## 2024-02-07 DIAGNOSIS — I87311 Chronic venous hypertension (idiopathic) with ulcer of right lower extremity: Secondary | ICD-10-CM

## 2024-02-07 DIAGNOSIS — L97312 Non-pressure chronic ulcer of right ankle with fat layer exposed: Secondary | ICD-10-CM

## 2024-02-07 DIAGNOSIS — L97512 Non-pressure chronic ulcer of other part of right foot with fat layer exposed: Secondary | ICD-10-CM

## 2024-02-09 ENCOUNTER — Other Ambulatory Visit (HOSPITAL_COMMUNITY): Payer: Self-pay | Admitting: Family Medicine

## 2024-02-12 ENCOUNTER — Encounter: Payer: Self-pay | Admitting: Internal Medicine

## 2024-02-14 ENCOUNTER — Encounter (HOSPITAL_BASED_OUTPATIENT_CLINIC_OR_DEPARTMENT_OTHER): Payer: Self-pay | Admitting: Internal Medicine

## 2024-02-15 ENCOUNTER — Encounter: Payer: Self-pay | Admitting: Internal Medicine

## 2024-02-21 ENCOUNTER — Encounter (HOSPITAL_BASED_OUTPATIENT_CLINIC_OR_DEPARTMENT_OTHER): Admitting: Internal Medicine

## 2024-02-28 ENCOUNTER — Encounter (HOSPITAL_BASED_OUTPATIENT_CLINIC_OR_DEPARTMENT_OTHER): Admitting: Internal Medicine

## 2024-03-06 ENCOUNTER — Encounter (HOSPITAL_BASED_OUTPATIENT_CLINIC_OR_DEPARTMENT_OTHER): Admitting: Internal Medicine

## 2024-03-18 ENCOUNTER — Encounter

## 2024-04-22 ENCOUNTER — Ambulatory Visit (HOSPITAL_COMMUNITY): Admitting: Internal Medicine

## 2024-06-17 ENCOUNTER — Encounter

## 2024-09-17 ENCOUNTER — Encounter

## 2024-12-17 ENCOUNTER — Encounter

## 2025-01-06 ENCOUNTER — Encounter (HOSPITAL_BASED_OUTPATIENT_CLINIC_OR_DEPARTMENT_OTHER): Admitting: Internal Medicine

## 2025-03-18 ENCOUNTER — Encounter
